# Patient Record
Sex: Female | Born: 1970
Health system: Southern US, Community
[De-identification: ages and names within clinical notes are randomized; demographics above are authoritative.]

## PROBLEM LIST (undated history)

## (undated) DIAGNOSIS — N183 Chronic kidney disease, stage 3 unspecified: Secondary | ICD-10-CM

## (undated) DIAGNOSIS — D509 Iron deficiency anemia, unspecified: Secondary | ICD-10-CM

## (undated) DIAGNOSIS — R0600 Dyspnea, unspecified: Secondary | ICD-10-CM

## (undated) DIAGNOSIS — I3139 Other pericardial effusion (noninflammatory): Secondary | ICD-10-CM

## (undated) DIAGNOSIS — J3089 Other allergic rhinitis: Secondary | ICD-10-CM

## (undated) DIAGNOSIS — I5030 Unspecified diastolic (congestive) heart failure: Secondary | ICD-10-CM

## (undated) DIAGNOSIS — R053 Chronic cough: Secondary | ICD-10-CM

## (undated) DIAGNOSIS — I313 Pericardial effusion (noninflammatory): Secondary | ICD-10-CM

## (undated) DIAGNOSIS — E1165 Type 2 diabetes mellitus with hyperglycemia: Secondary | ICD-10-CM

## (undated) DIAGNOSIS — E119 Type 2 diabetes mellitus without complications: Secondary | ICD-10-CM

## (undated) DIAGNOSIS — I499 Cardiac arrhythmia, unspecified: Secondary | ICD-10-CM

## (undated) DIAGNOSIS — J9611 Chronic respiratory failure with hypoxia: Secondary | ICD-10-CM

## (undated) DIAGNOSIS — J40 Bronchitis, not specified as acute or chronic: Secondary | ICD-10-CM

## (undated) DIAGNOSIS — Z9981 Dependence on supplemental oxygen: Secondary | ICD-10-CM

## (undated) DIAGNOSIS — J189 Pneumonia, unspecified organism: Secondary | ICD-10-CM

## (undated) DIAGNOSIS — F32A Depression, unspecified: Secondary | ICD-10-CM

## (undated) DIAGNOSIS — I509 Heart failure, unspecified: Secondary | ICD-10-CM

## (undated) DIAGNOSIS — I10 Essential (primary) hypertension: Secondary | ICD-10-CM

## (undated) DIAGNOSIS — R0601 Orthopnea: Secondary | ICD-10-CM

## (undated) DIAGNOSIS — R05 Cough: Secondary | ICD-10-CM

## (undated) DIAGNOSIS — M7989 Other specified soft tissue disorders: Secondary | ICD-10-CM

## (undated) DIAGNOSIS — E662 Morbid (severe) obesity with alveolar hypoventilation: Secondary | ICD-10-CM

## (undated) DIAGNOSIS — K5790 Diverticulosis of intestine, part unspecified, without perforation or abscess without bleeding: Secondary | ICD-10-CM

## (undated) DIAGNOSIS — J45909 Unspecified asthma, uncomplicated: Secondary | ICD-10-CM

## (undated) DIAGNOSIS — F419 Anxiety disorder, unspecified: Secondary | ICD-10-CM

## (undated) DIAGNOSIS — F329 Major depressive disorder, single episode, unspecified: Secondary | ICD-10-CM

## (undated) DIAGNOSIS — N19 Unspecified kidney failure: Secondary | ICD-10-CM

## (undated) DIAGNOSIS — J449 Chronic obstructive pulmonary disease, unspecified: Secondary | ICD-10-CM

## (undated) HISTORY — DX: Chronic respiratory failure with hypoxia: J96.11

## (undated) HISTORY — DX: Morbid (severe) obesity with alveolar hypoventilation: E66.2

## (undated) SURGERY — Surgical Case
Anesthesia: *Unknown

---

## 1998-07-14 ENCOUNTER — Ambulatory Visit: Admission: RE | Admit: 1998-07-14 | Discharge: 1998-07-14 | Payer: Self-pay | Admitting: Pulmonary Disease

## 2004-10-29 ENCOUNTER — Emergency Department: Payer: Self-pay | Admitting: Emergency Medicine

## 2004-10-29 IMAGING — CR DG ELBOW COMPLETE 3+V*L*
1 series · 4 of 4 positions shown · non-contrast
Comparison: none

REASON FOR EXAM: trauma
COMMENTS:  LMP: [DATE]

PROCEDURE:     DXR - DXR ELBOW LT COMP W/OBLIQUES  - [DATE]  [DATE]
RESULT:     Four views of the LEFT elbow show no fracture, dislocation or
other acute bony abnormality.

[Series 1: view not recorded · 0.17mm/px · 4 of 4 slices shown]
[im 1/4]
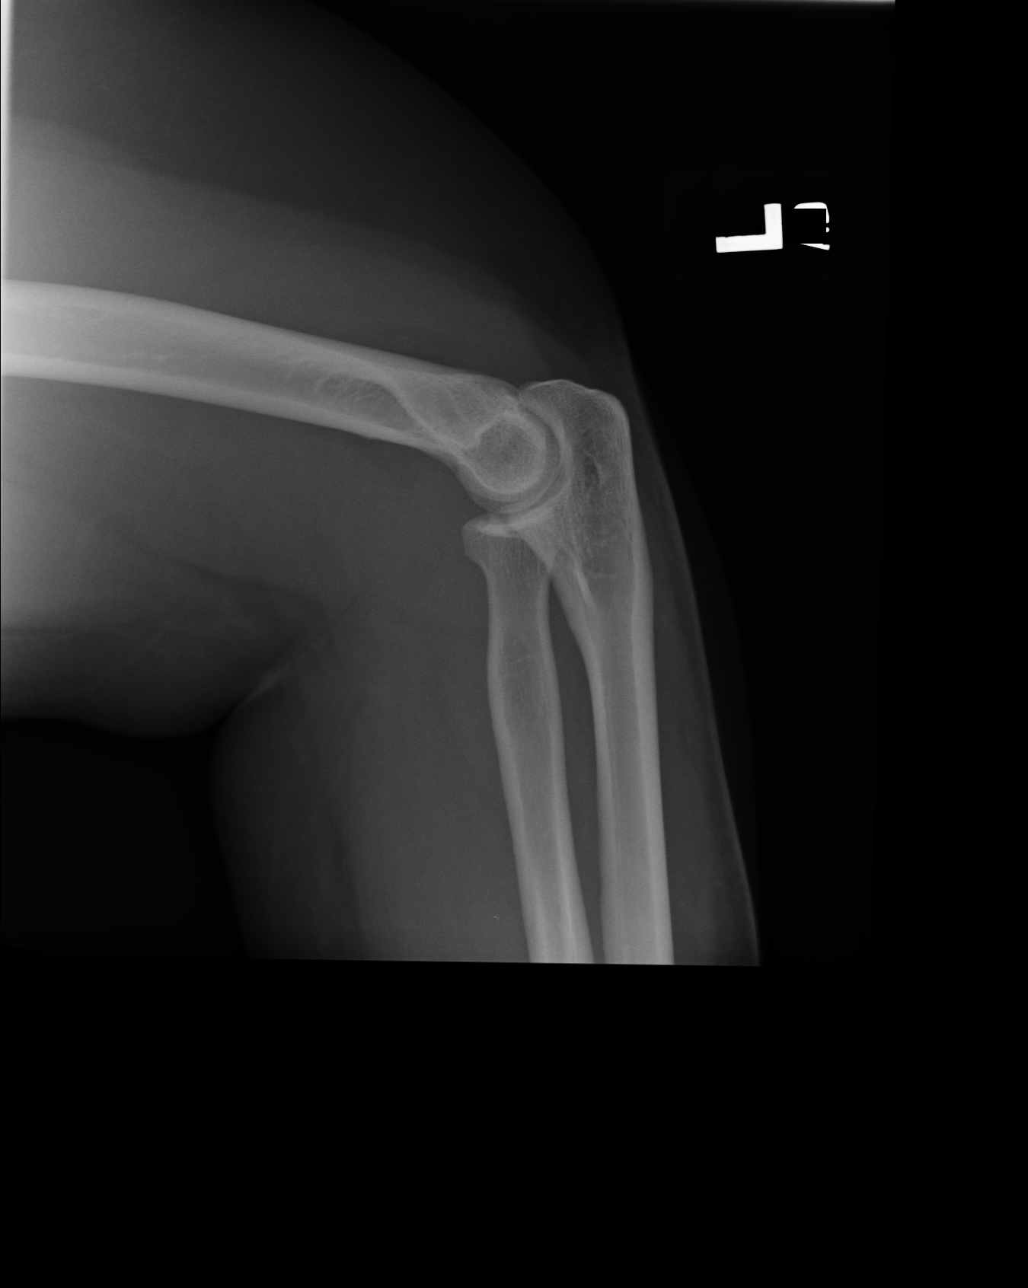
[im 2/4]
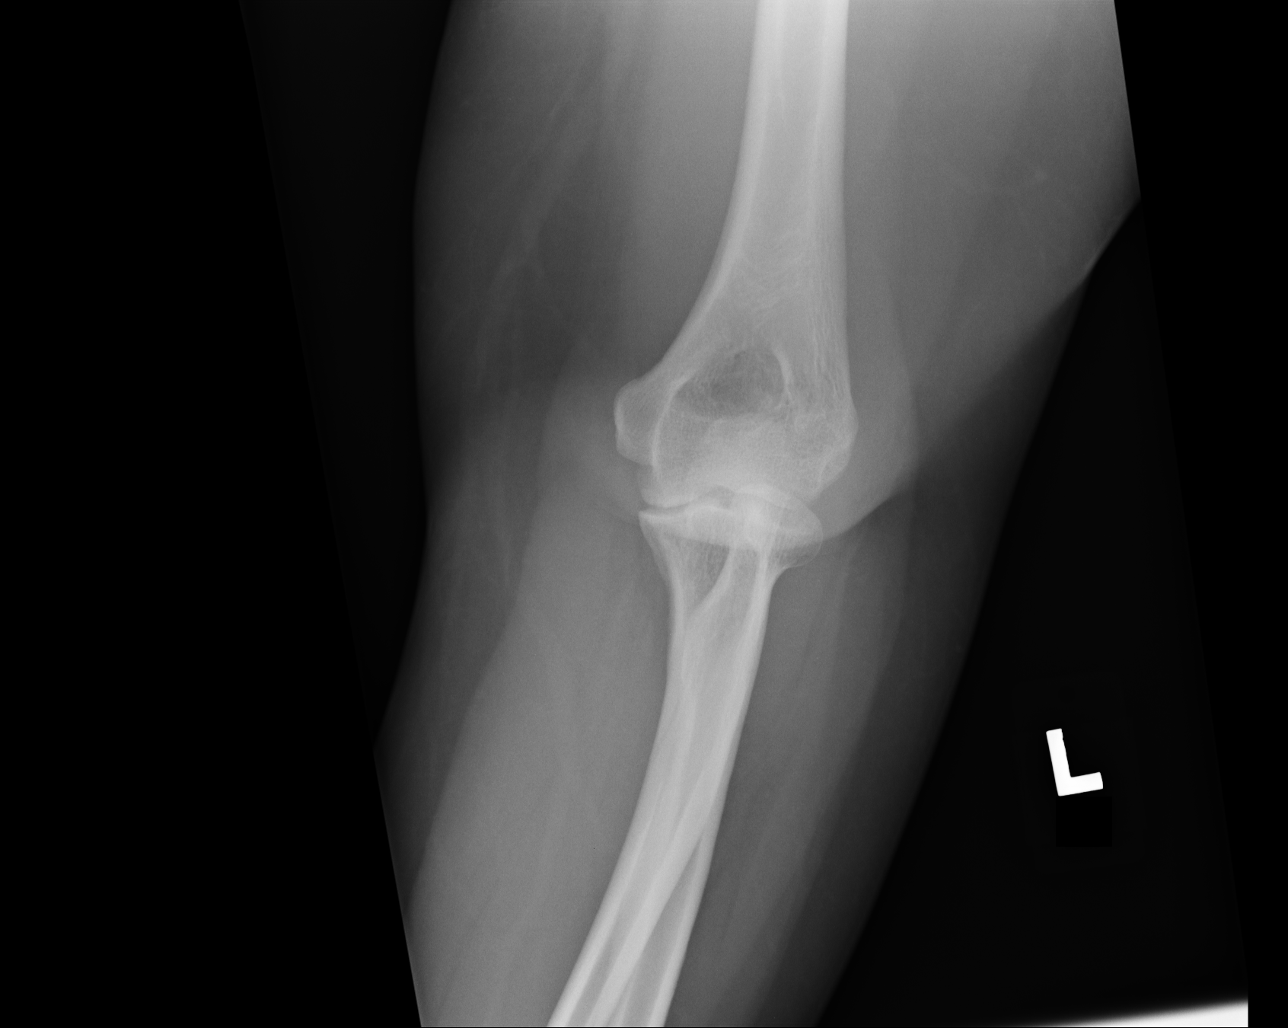
[im 3/4]
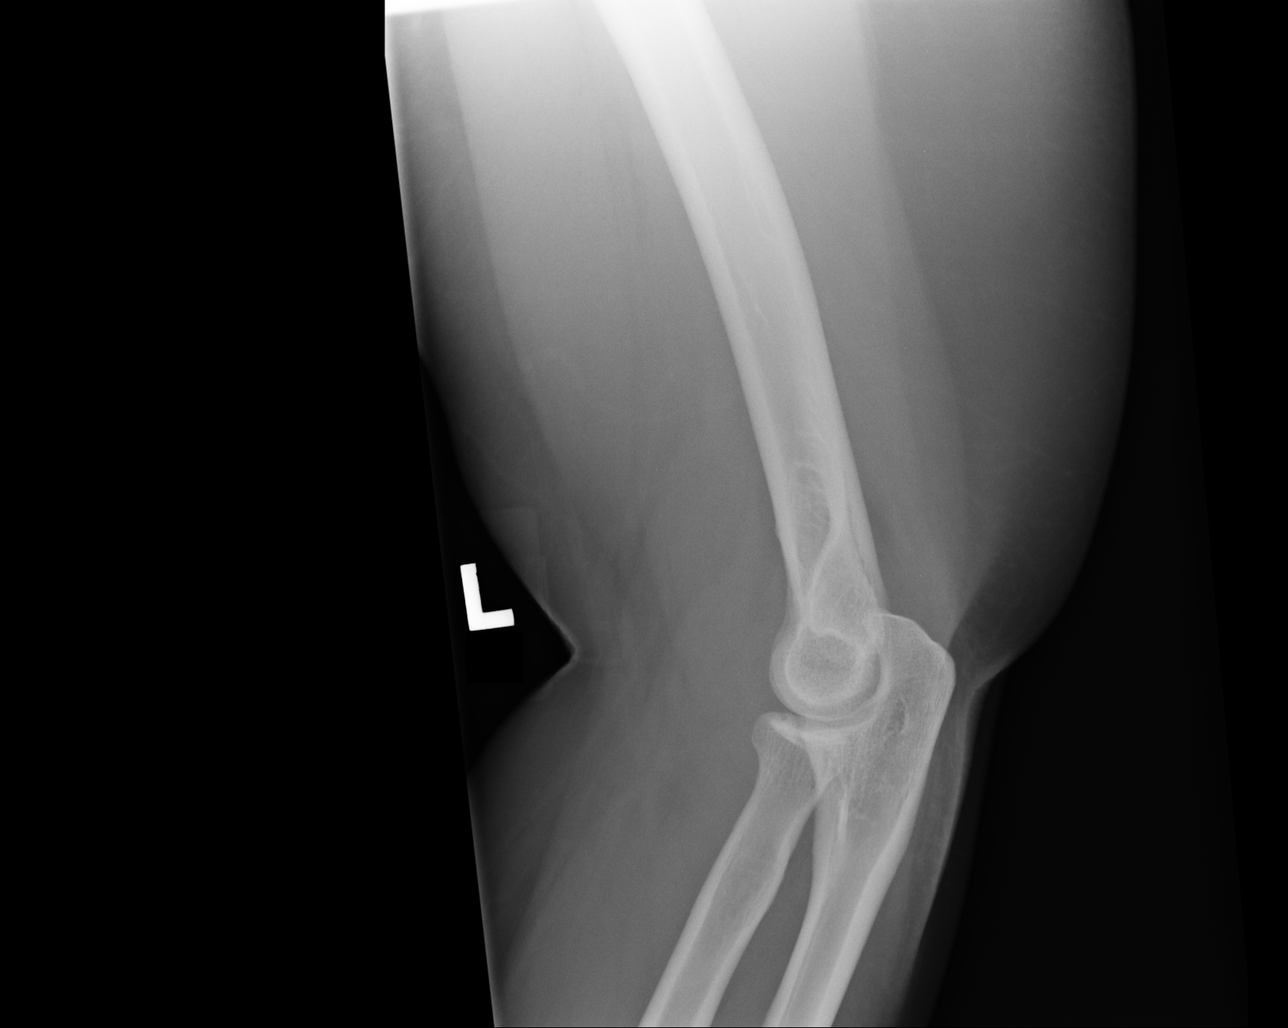
[im 4/4]
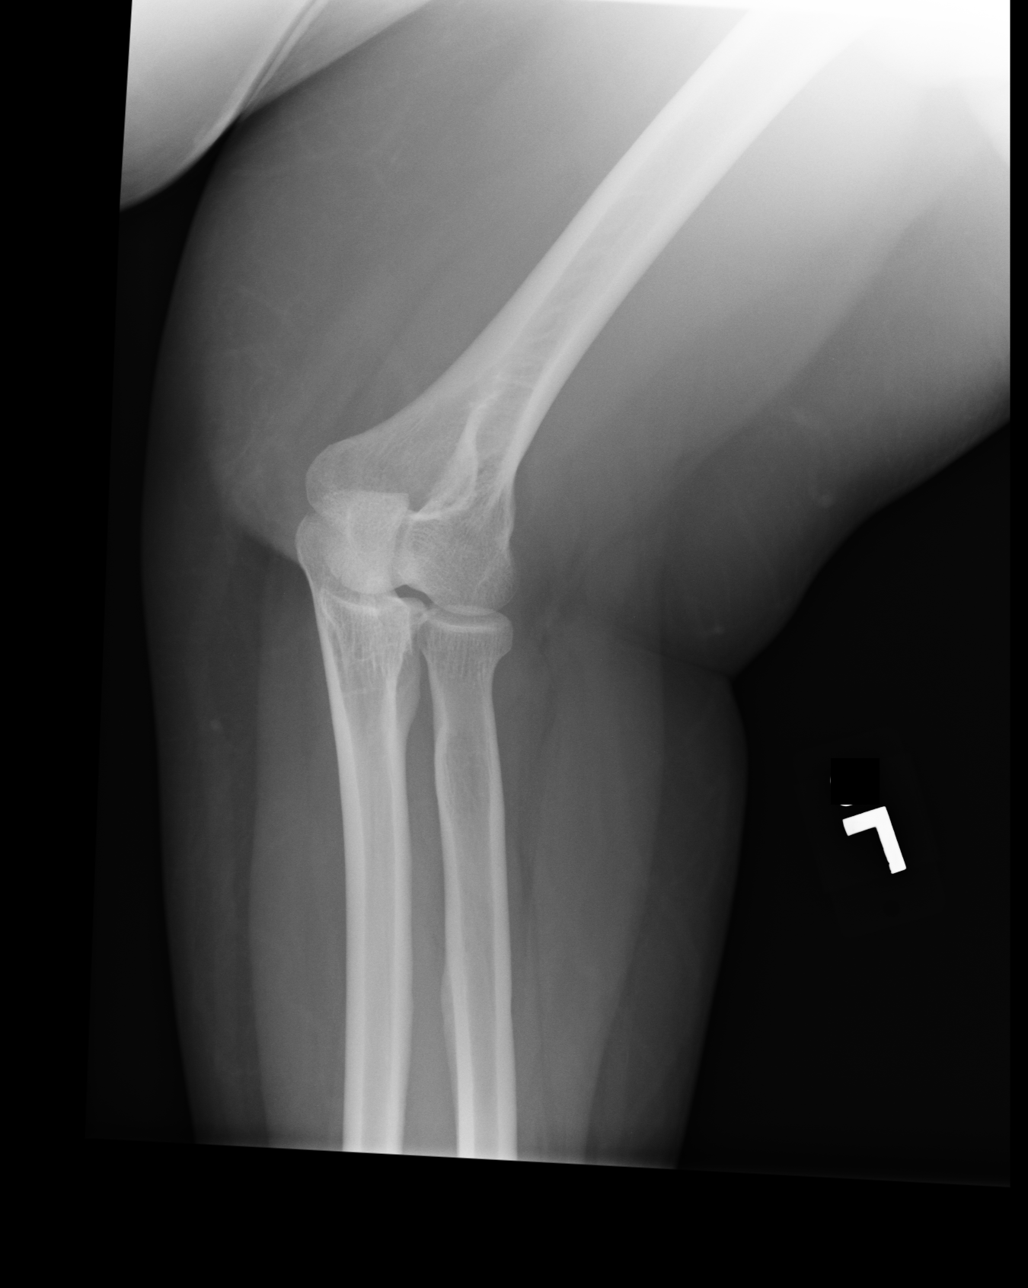

[4 of 4 positions shown; findings below may reference images not displayed]

IMPRESSION: 1)No acute bony abnormalities are identified.

## 2005-04-08 ENCOUNTER — Emergency Department: Payer: Self-pay | Admitting: General Practice

## 2005-06-24 IMAGING — CR DG HUMERUS 2V *L*
1 series · 2 of 2 positions shown · non-contrast
Comparison: none

REASON FOR EXAM: fall
COMMENTS:

PROCEDURE:     DXR - DXR HUMERUS LEFT  - [DATE]  [DATE]
RESULT:     No fracture, dislocation or other acute bony abnormalities are
noted.

[Series 1: view not recorded · 0.17mm/px · 2 of 2 slices shown]
[im 1/2]
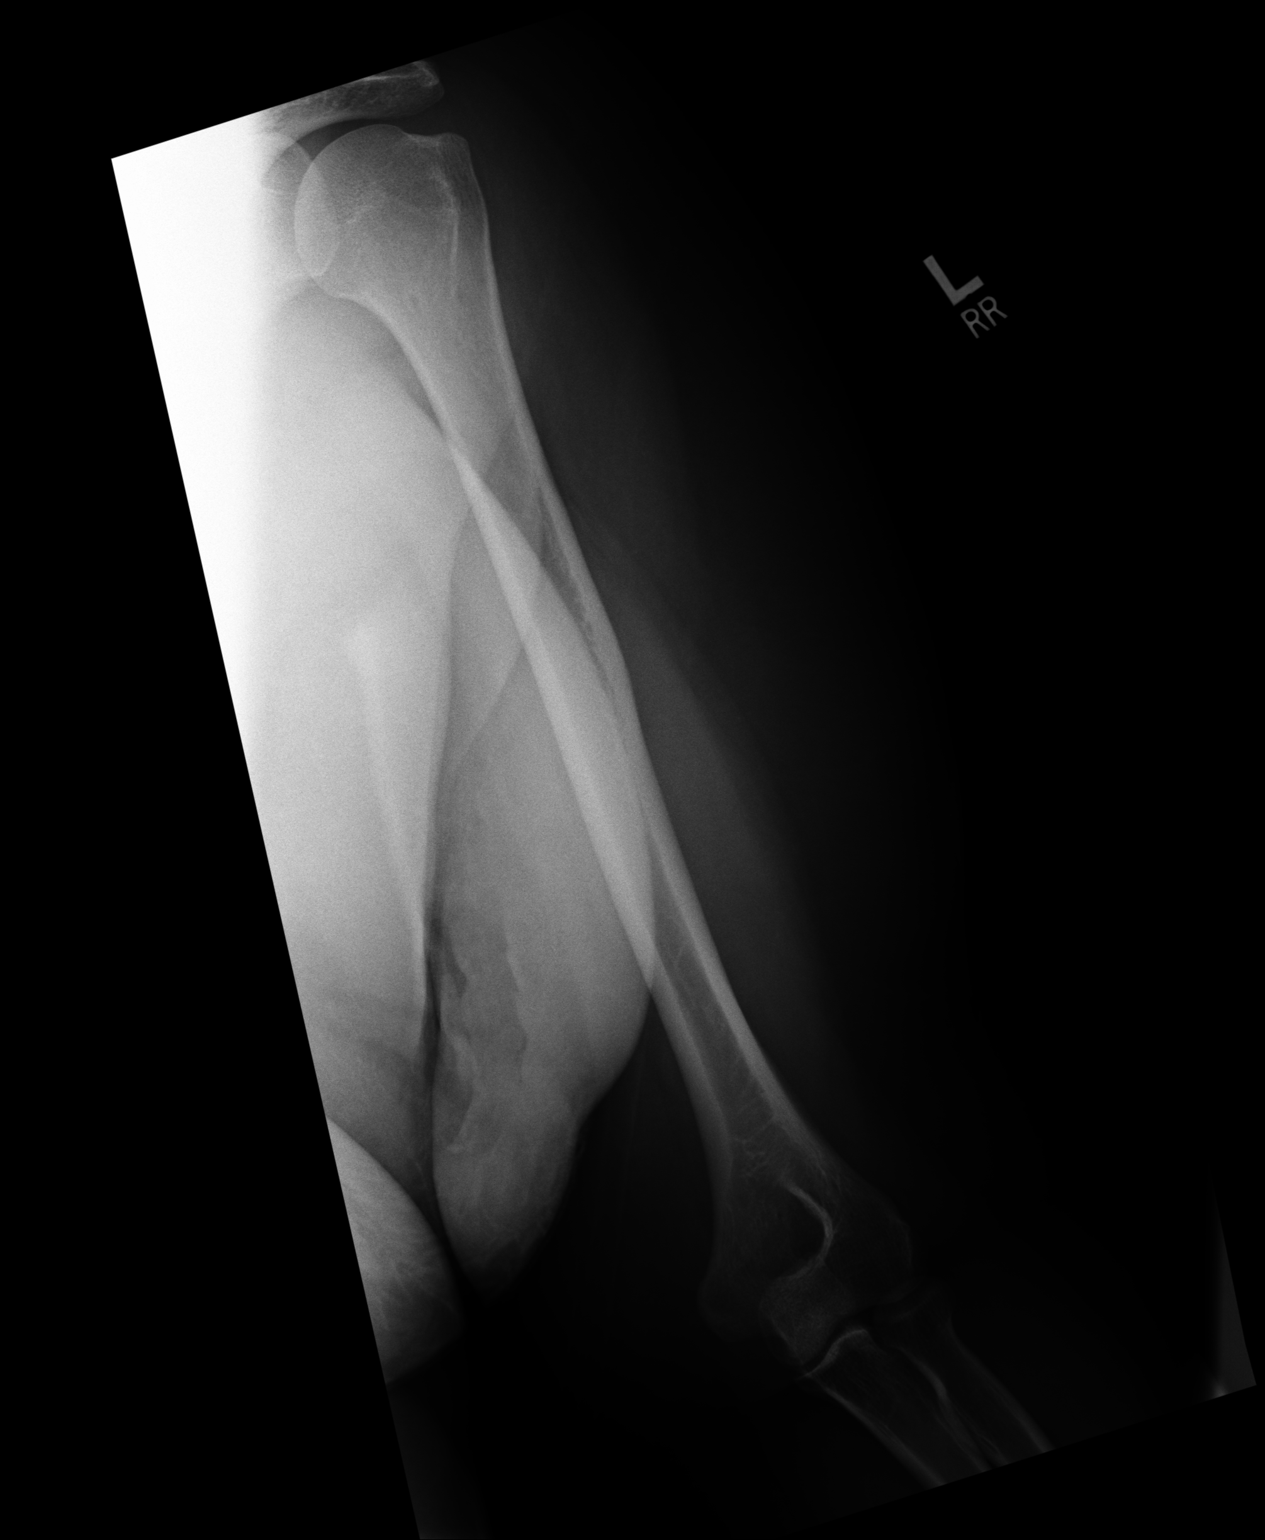
[im 2/2]
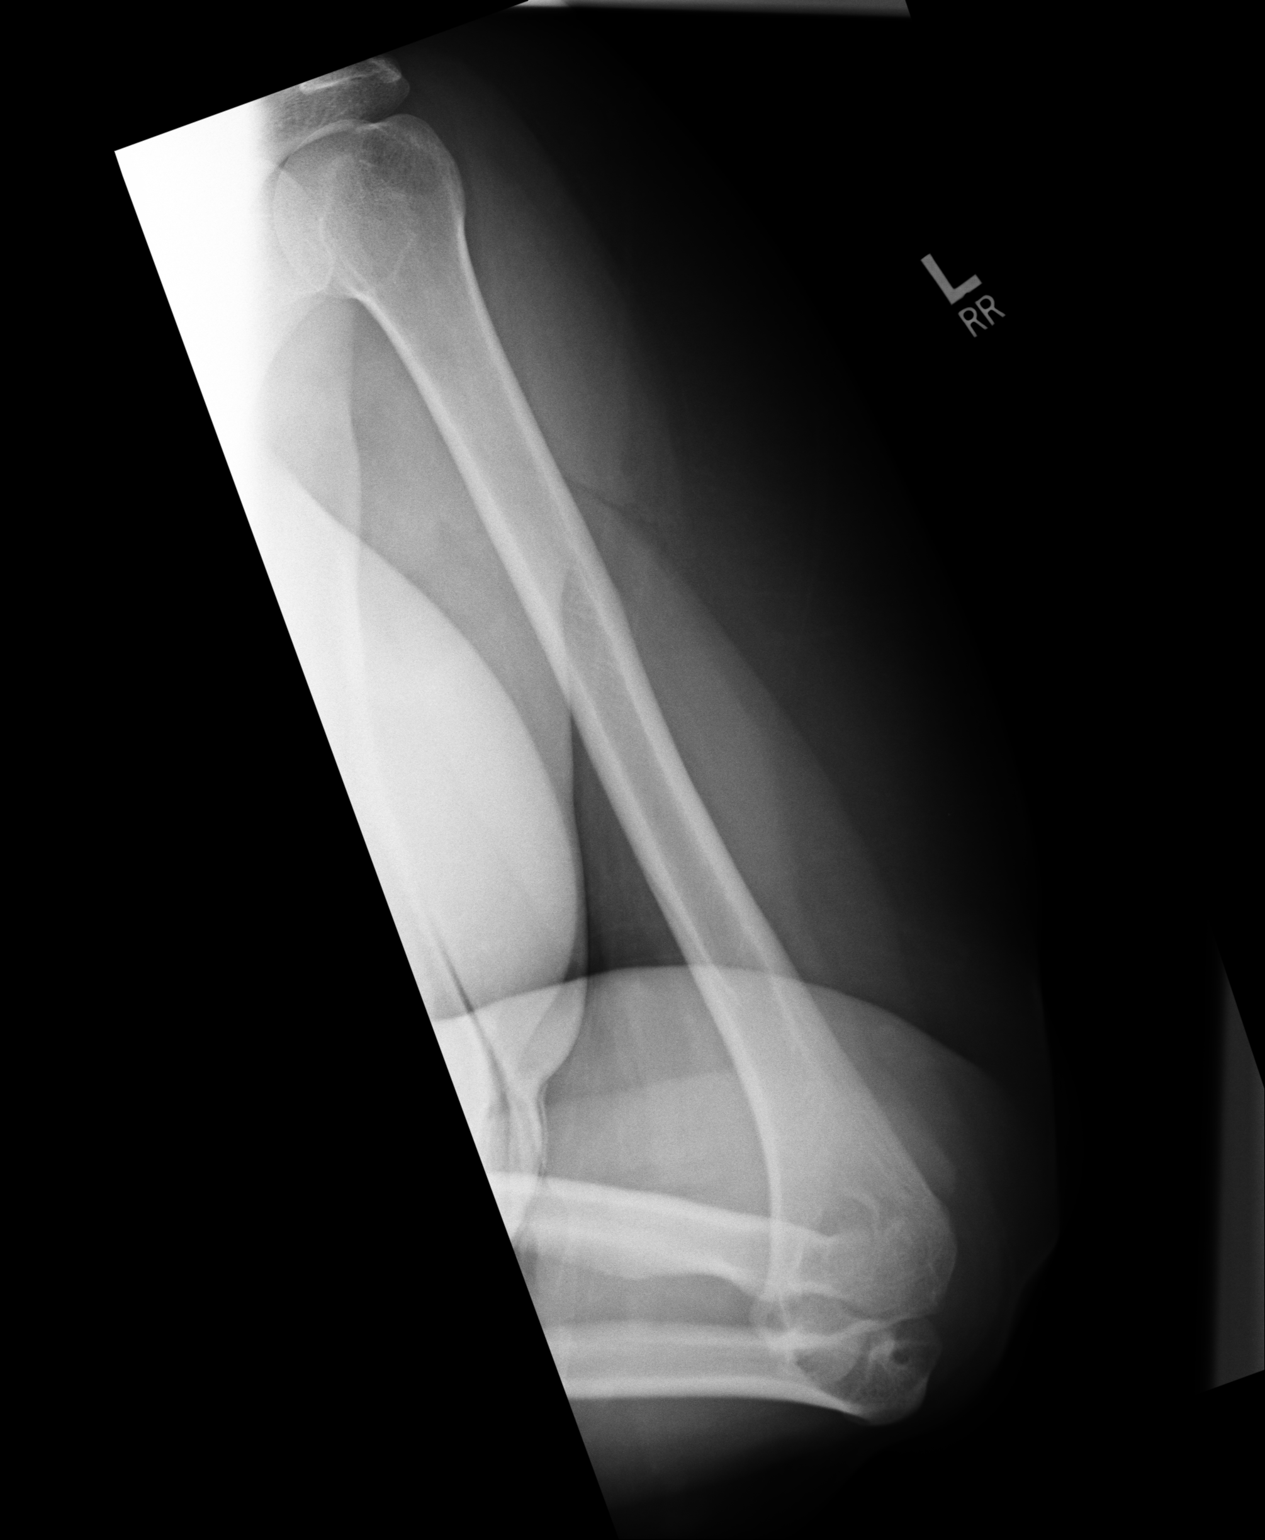

[2 of 2 positions shown; findings below may reference images not displayed]

IMPRESSION: 1)No acute bony abnormalities are identified.

## 2005-07-03 ENCOUNTER — Ambulatory Visit: Payer: Self-pay | Admitting: Gynecology

## 2005-07-12 ENCOUNTER — Inpatient Hospital Stay (HOSPITAL_COMMUNITY): Admission: AD | Admit: 2005-07-12 | Discharge: 2005-07-12 | Payer: Self-pay | Admitting: *Deleted

## 2005-07-18 ENCOUNTER — Ambulatory Visit: Payer: Self-pay | Admitting: Obstetrics & Gynecology

## 2005-07-25 ENCOUNTER — Ambulatory Visit: Payer: Self-pay | Admitting: Obstetrics & Gynecology

## 2005-09-05 ENCOUNTER — Ambulatory Visit: Payer: Self-pay | Admitting: Obstetrics & Gynecology

## 2005-09-12 ENCOUNTER — Ambulatory Visit: Payer: Self-pay | Admitting: Obstetrics & Gynecology

## 2005-09-25 ENCOUNTER — Ambulatory Visit: Payer: Self-pay | Admitting: Gynecology

## 2005-09-25 ENCOUNTER — Ambulatory Visit (HOSPITAL_COMMUNITY): Admission: RE | Admit: 2005-09-25 | Discharge: 2005-09-25 | Payer: Self-pay | Admitting: Gynecology

## 2005-11-07 ENCOUNTER — Ambulatory Visit: Payer: Self-pay | Admitting: Obstetrics & Gynecology

## 2007-05-19 ENCOUNTER — Emergency Department: Payer: Self-pay | Admitting: Emergency Medicine

## 2007-06-10 ENCOUNTER — Emergency Department: Payer: Self-pay | Admitting: Emergency Medicine

## 2007-06-10 IMAGING — US US OB < 14 WEEKS - US OB TV
1 series · 17 of 28 positions shown · non-contrast
Comparison: none

REASON FOR EXAM: bleeding
COMMENTS:

[Series 1: us ob < 14 weeks - us ob tv · 17 of 37 slices shown]
[im 1/37]
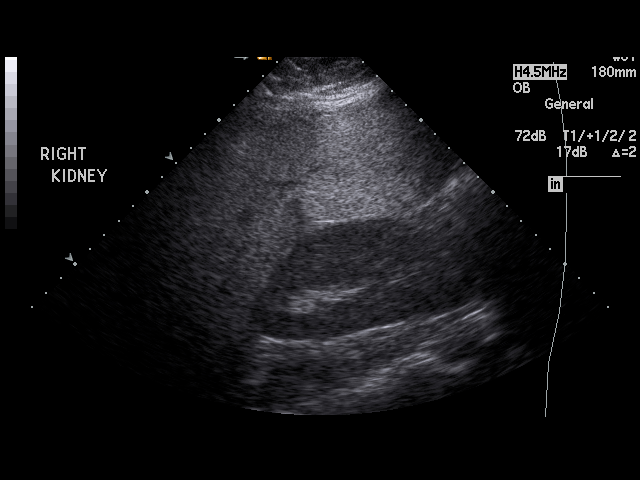
[im 3/37]
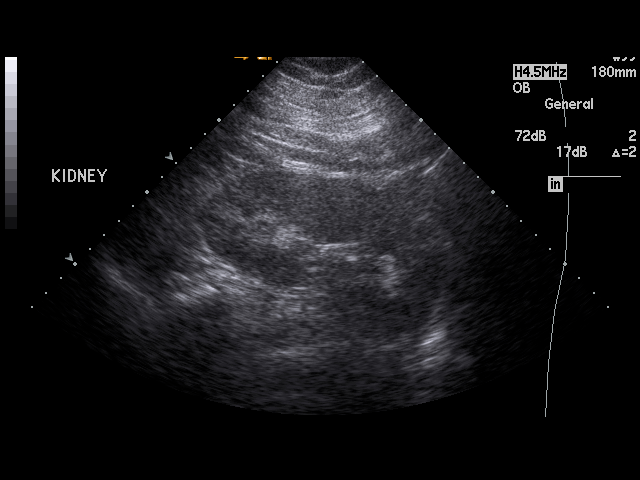
[im 6/37]
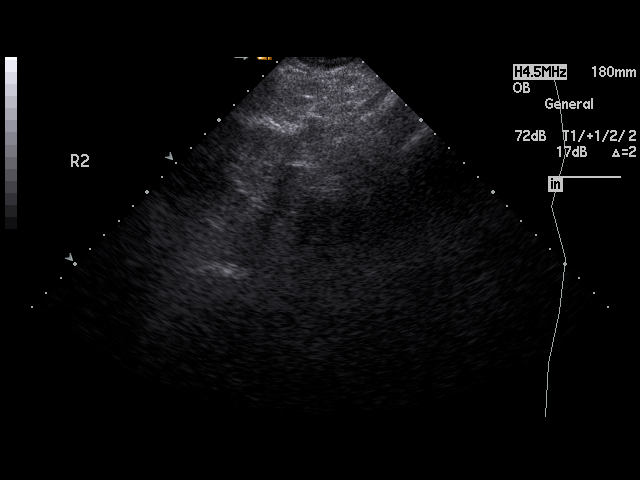
[im 7/37]
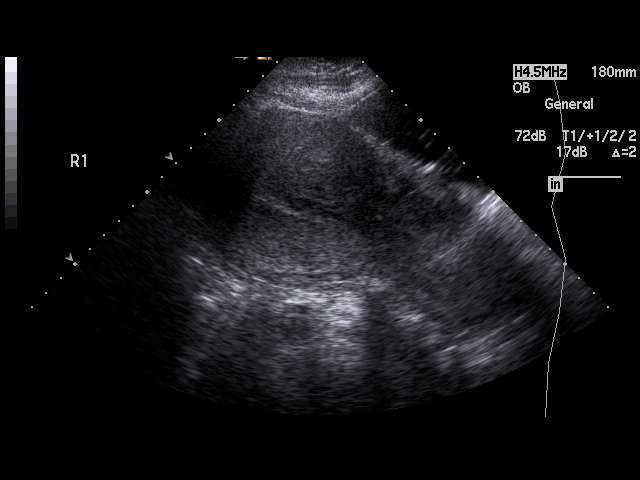
[im 10/37]
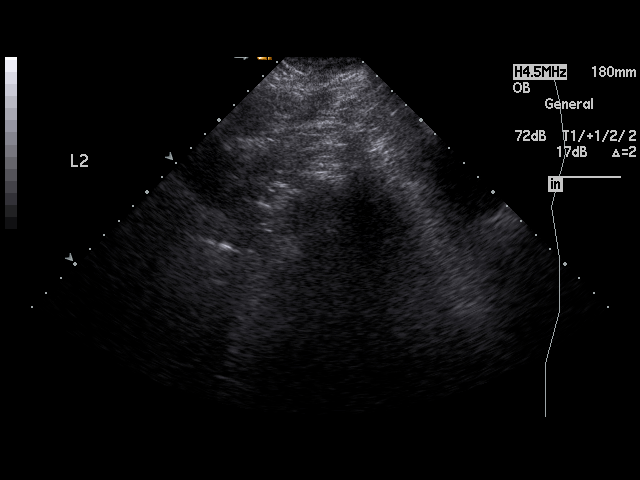
[im 13/37]
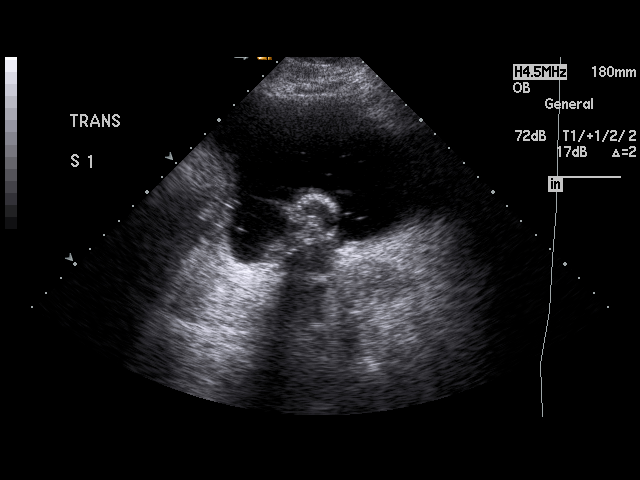
[im 14/37]
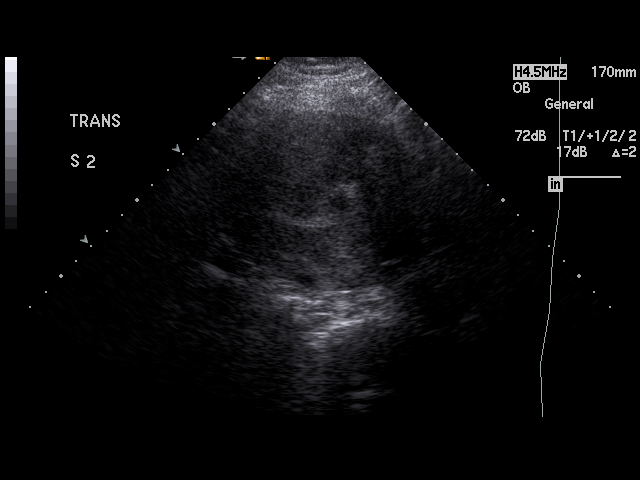
[im 17/37]
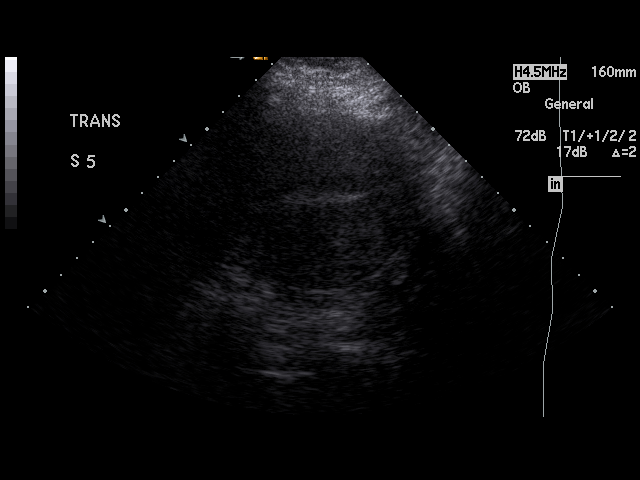
[im 19/37]
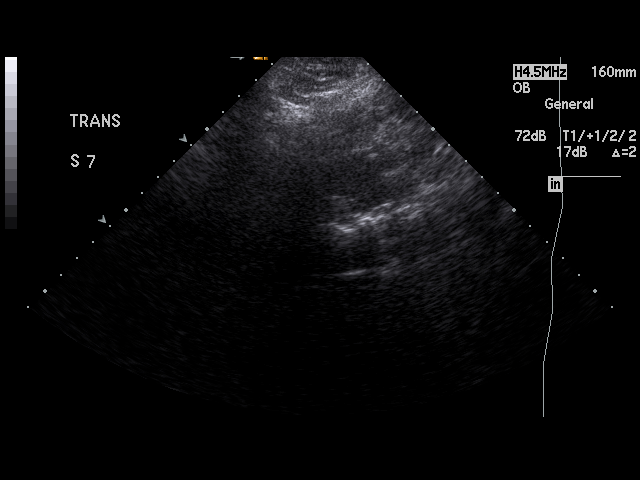
[im 21/37]
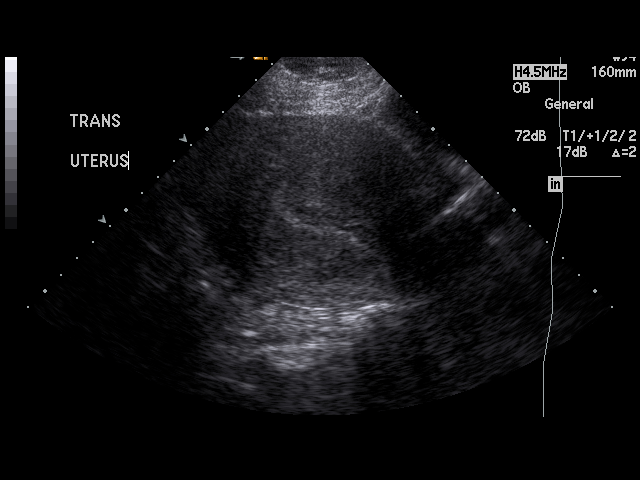
[im 23/37]
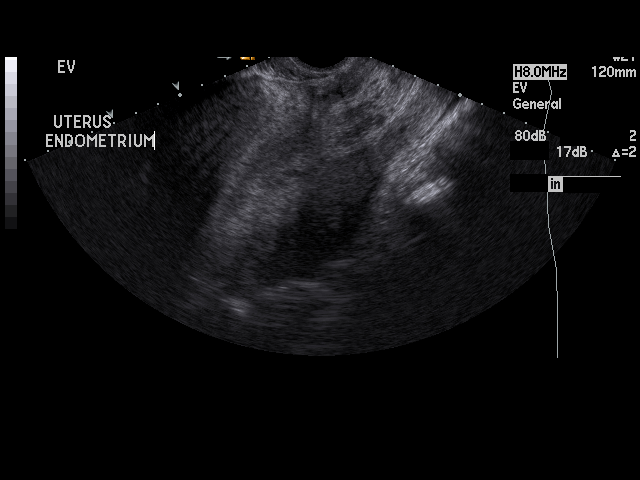
[im 25/37]
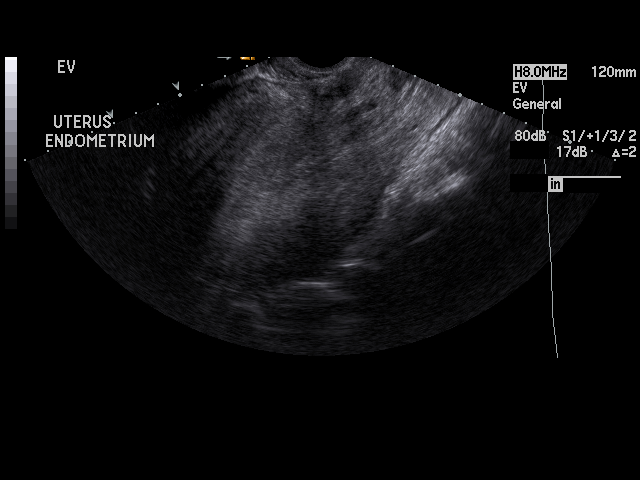
[im 27/37]
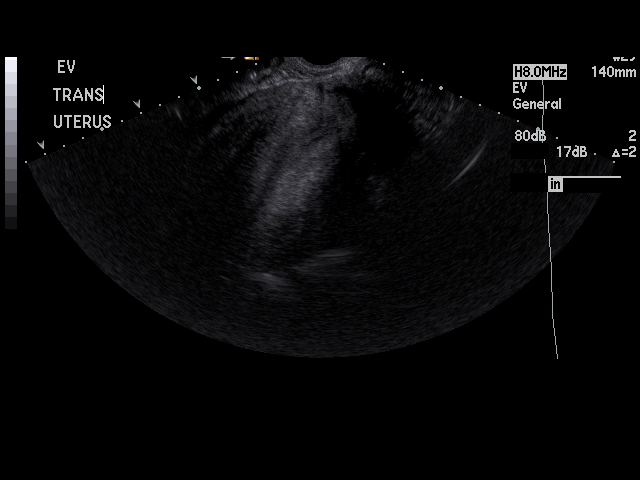
[im 30/37]
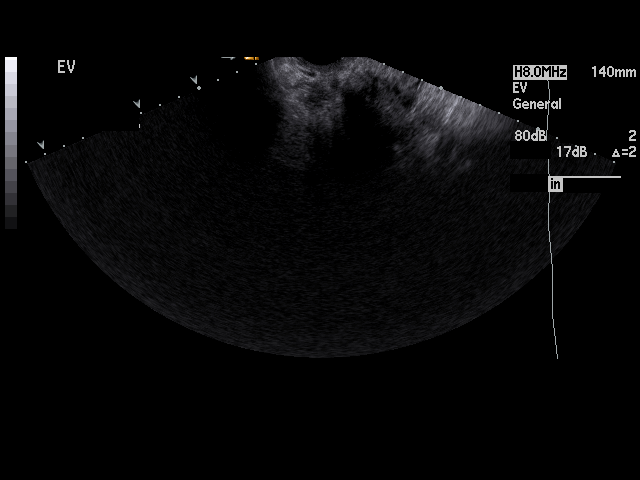
[im 31/37]
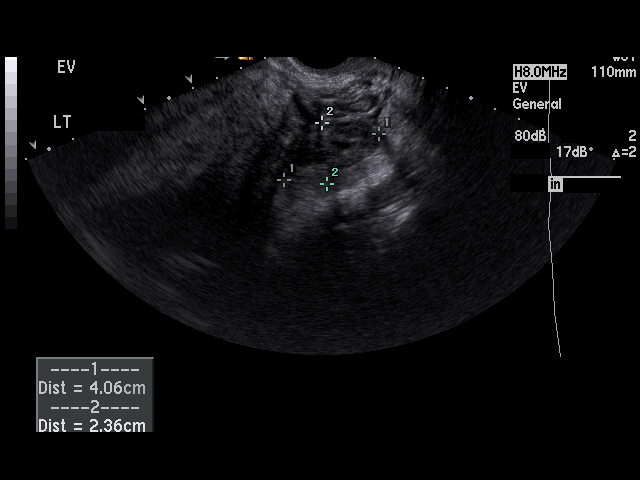
[im 34/37]
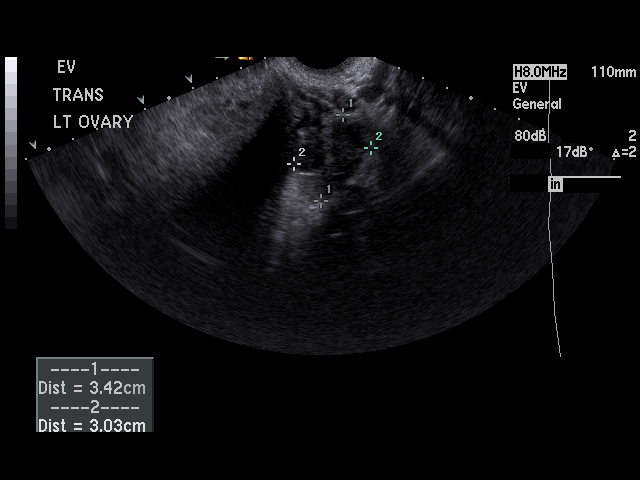
[im 37/37]
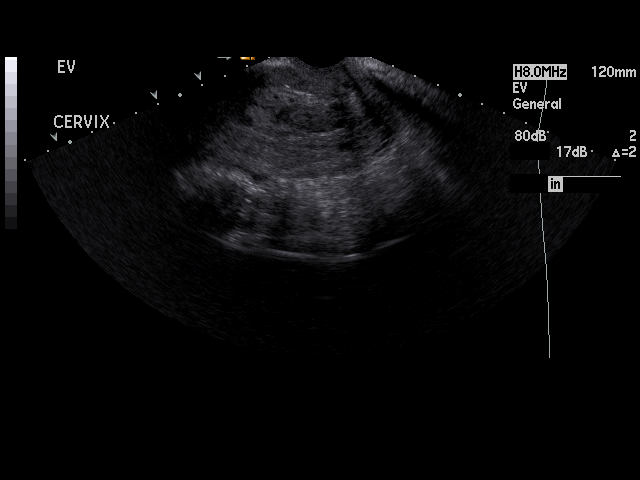

[17 of 28 positions shown; findings below may reference images not displayed]

PROCEDURE:     US  - US OB LESS THAN 14 WEEKS  - [DATE]  [DATE]

RESULT:     Emergent sonogram of the pelvis is performed. The patient had
undergone previous ultrasound of the pelvis in [facility] on [DATE]. The
patient was informed there was an 8 week intrauterine gestation with no
heartbeat evident. The patient has been passing tissue and clots.

The kidneys appear to be grossly normal. The uterus shows a thickened
endometrial stripe of 2.8 cm with evidence of fluid, possibly blood, in the
endometrial cavity and in the cervix. There is no intraperitoneal free
fluid. The LEFT ovary appears unremarkable. The RIGHT ovary could not be
visualized.
IMPRESSION: No intrauterine gestation. There is some fluid or blood as
described. The endometrial stripe is thickened. The possibility of retained
products of conception cannot be completely excluded given the appearance.
Gynecologic follow-up is suggested.

## 2008-05-03 ENCOUNTER — Ambulatory Visit: Payer: Self-pay | Admitting: General Practice

## 2008-12-21 ENCOUNTER — Emergency Department: Payer: Self-pay | Admitting: Emergency Medicine

## 2008-12-21 IMAGING — CR DG CHEST 2V
1 series · 2 of 2 positions shown · non-contrast
Comparison: none

REASON FOR EXAM: cough
COMMENTS:

[Series 1: view not recorded · 0.17mm/px · 2 of 2 slices shown]
[im 1/2]
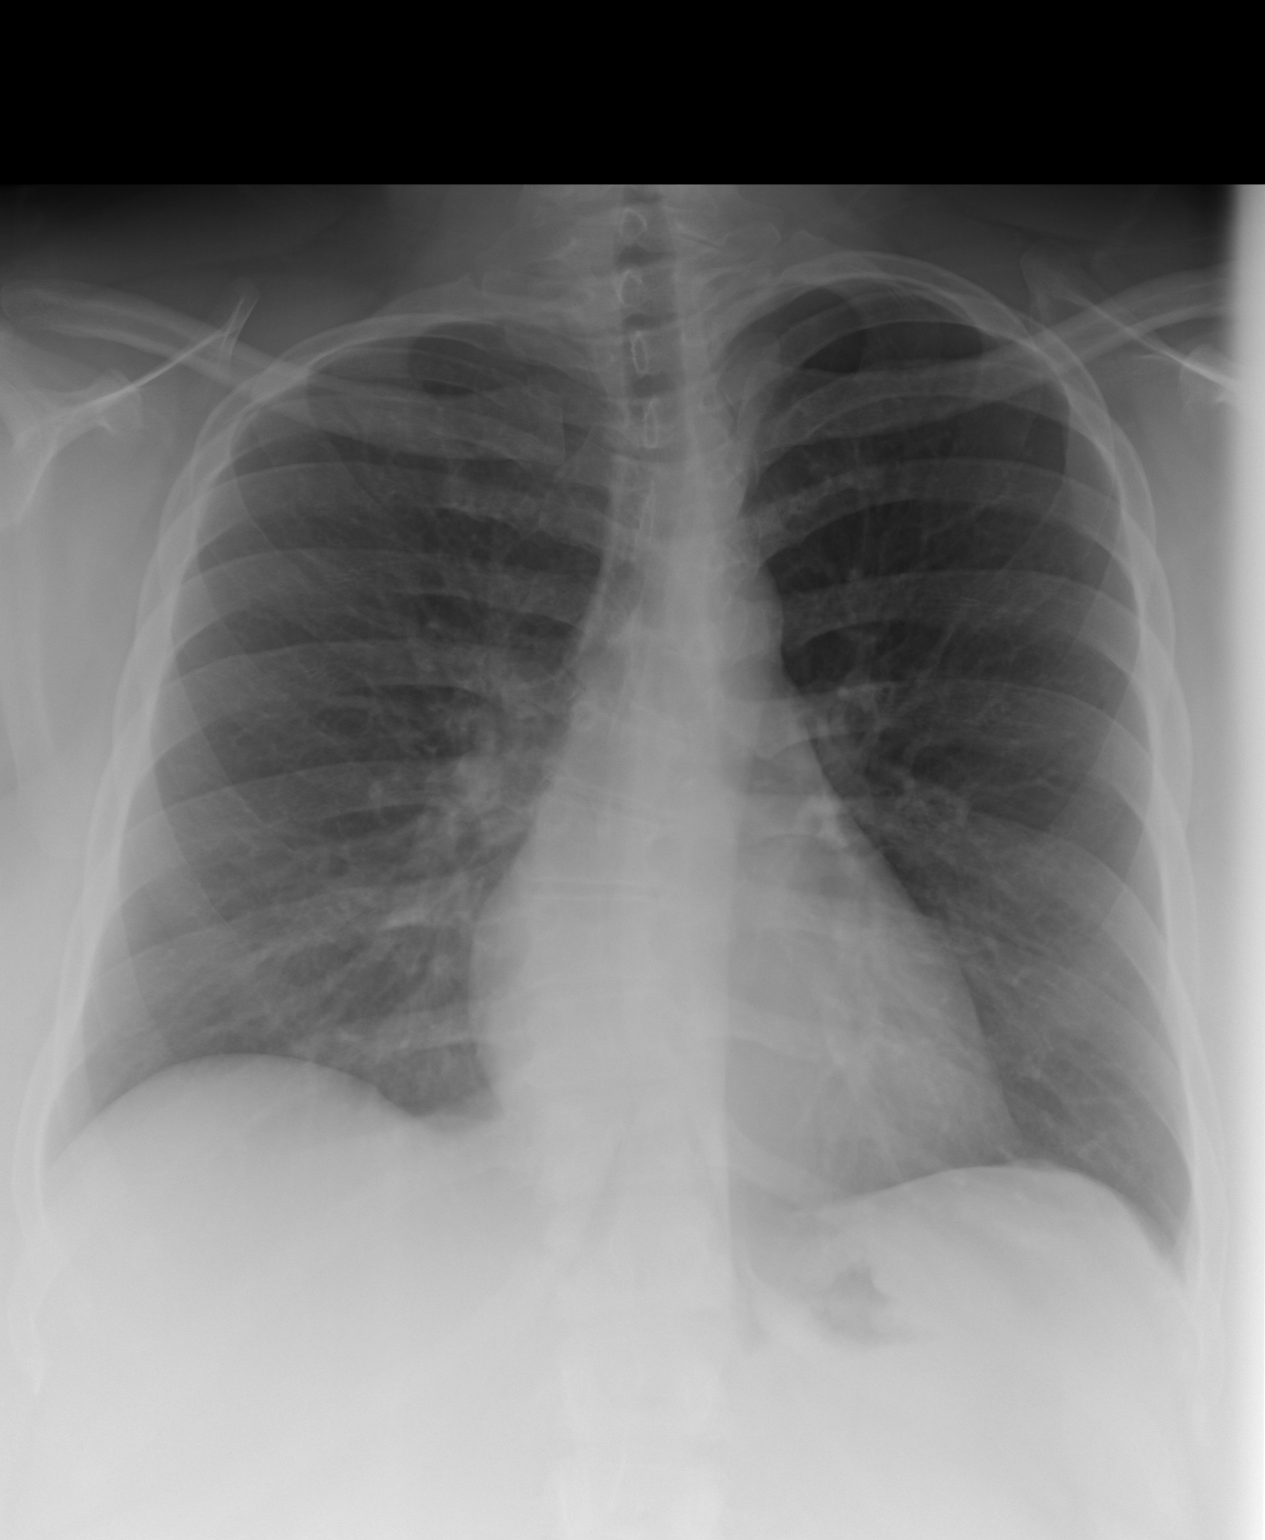
[im 2/2]
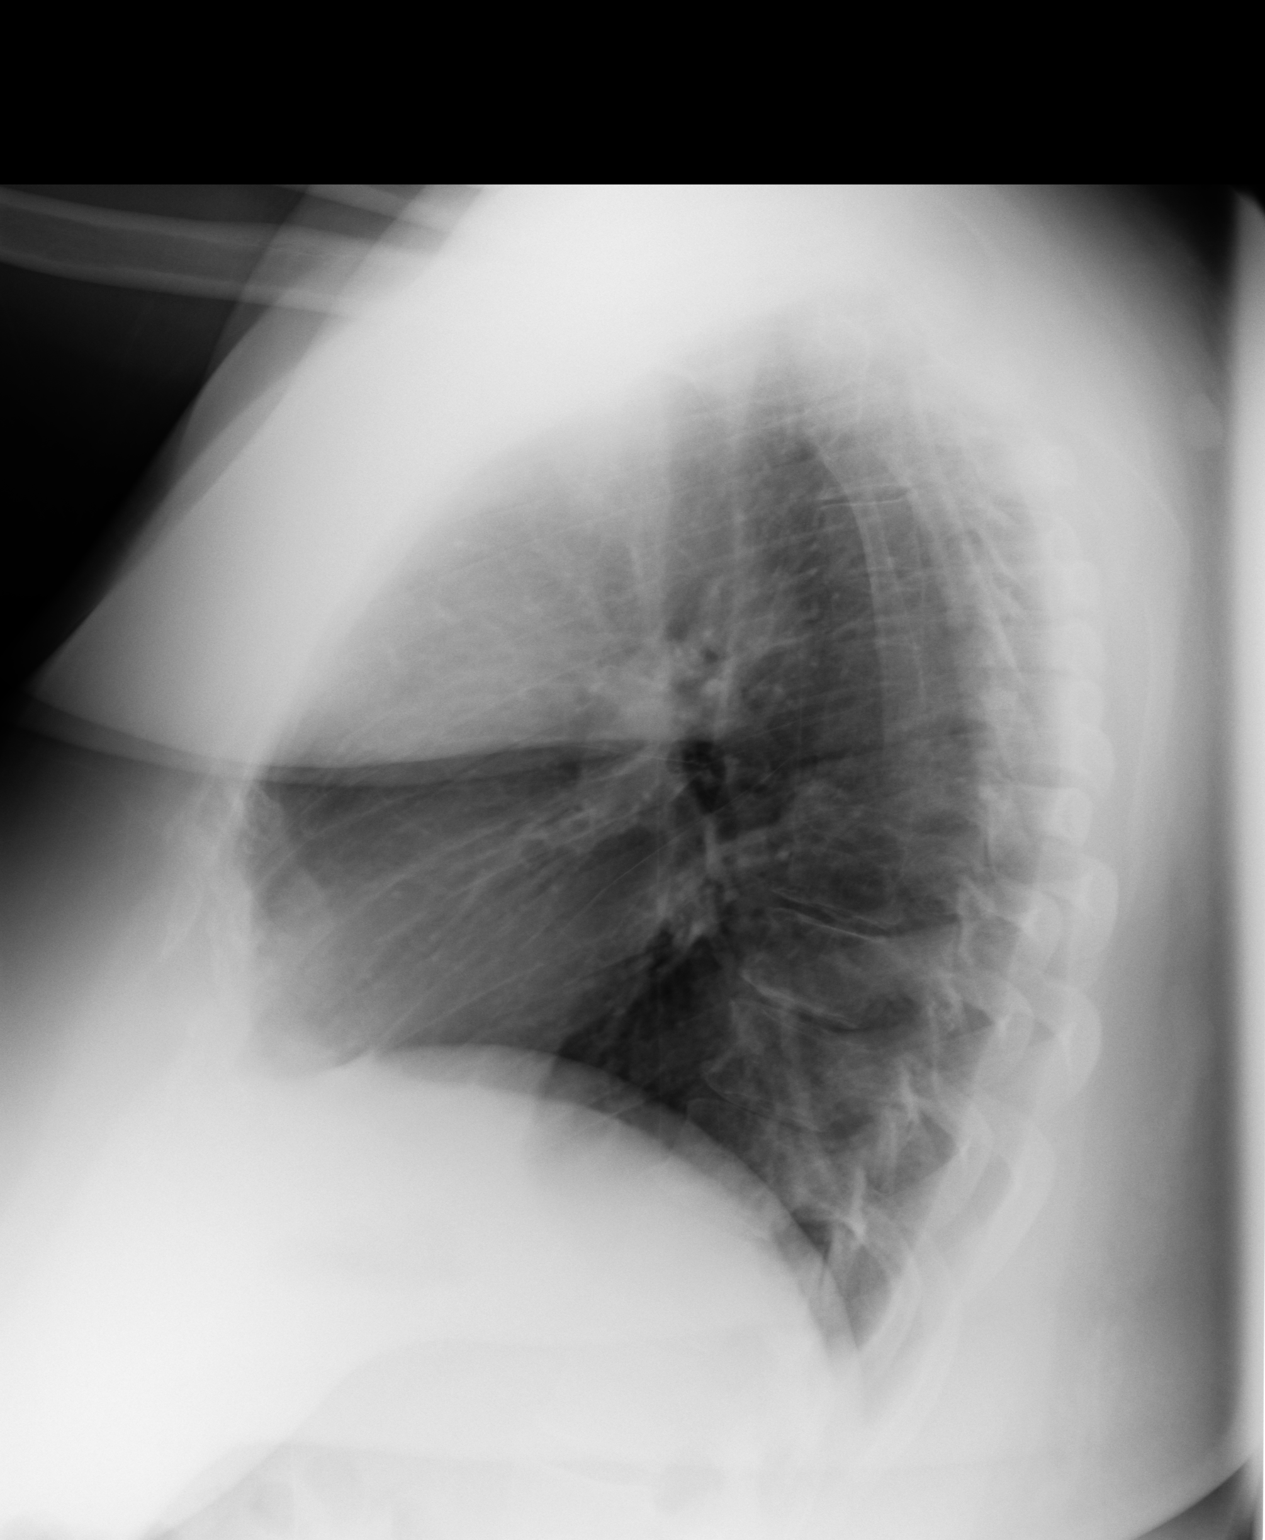

[2 of 2 positions shown; findings below may reference images not displayed]

PROCEDURE:     DXR - DXR CHEST PA (OR AP) AND LATERAL  - [DATE]  [DATE]

RESULT:     The lungs are clear. The heart and pulmonary vessels are normal.
The bony and mediastinal structures are unremarkable. There is no effusion.
There is no pneumothorax or evidence of congestive failure. Incidental note
is made of a mild scoliosis of the inferior thoracic spine concave to the
left.
IMPRESSION: No acute cardiopulmonary disease.

## 2011-02-05 ENCOUNTER — Emergency Department: Payer: Self-pay | Admitting: Emergency Medicine

## 2011-02-05 IMAGING — CR DG CHEST 2V
1 series · 2 of 2 positions shown · non-contrast
Comparison: none

REASON FOR EXAM: cough,  sob , fever
COMMENTS:

PROCEDURE:     DXR - DXR CHEST PA (OR AP) AND LATERAL  - [DATE]  [DATE]
RESULT:     The lungs are clear. The cardiac silhouette and visualized bony
skeleton are unremarkable. The patient is taken a shallow inspiration, which
accentuates the interstitial findings.

[Series 1: w chest pa · 0.14mm/px · 2 of 2 slices shown]
[im 1/2]
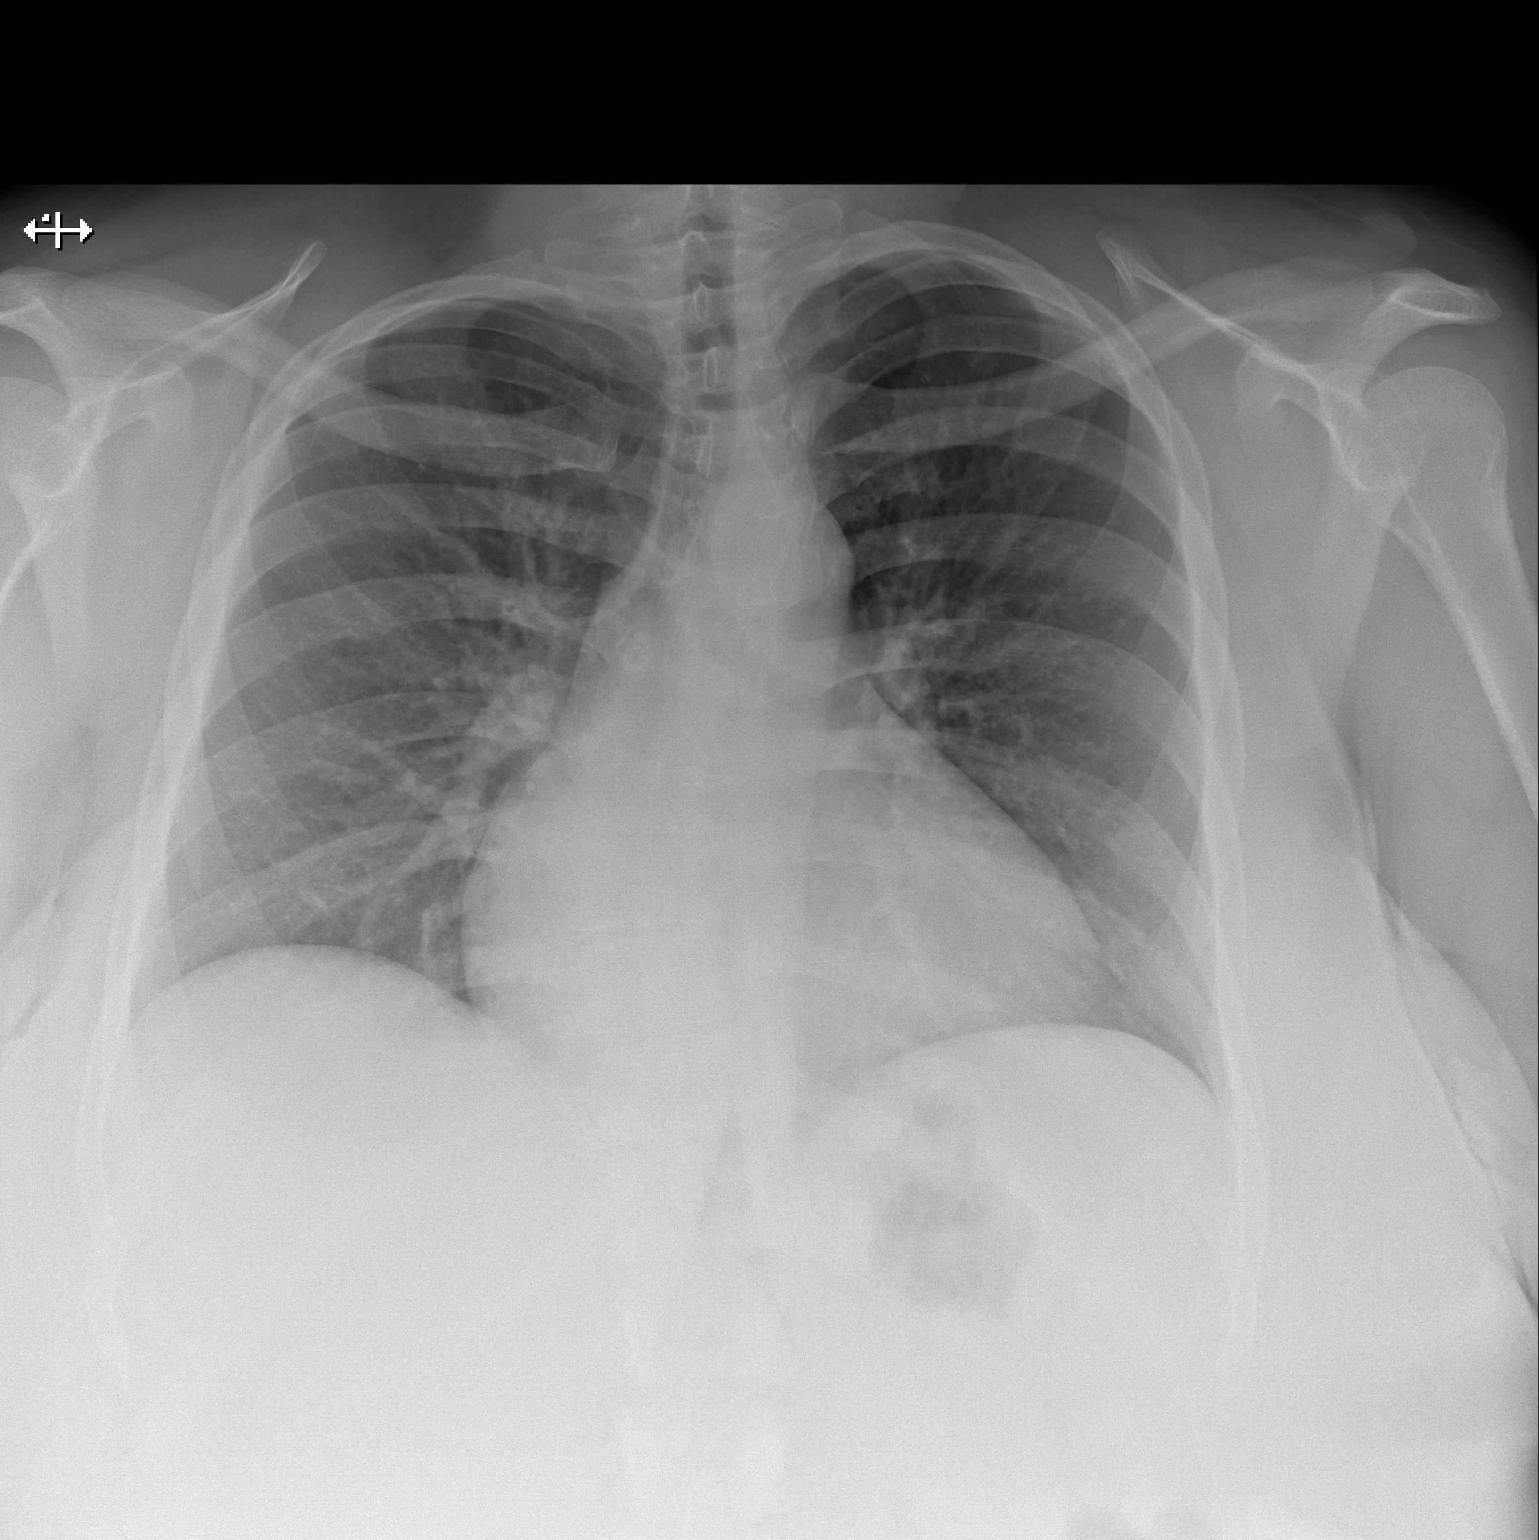
[im 2/2]
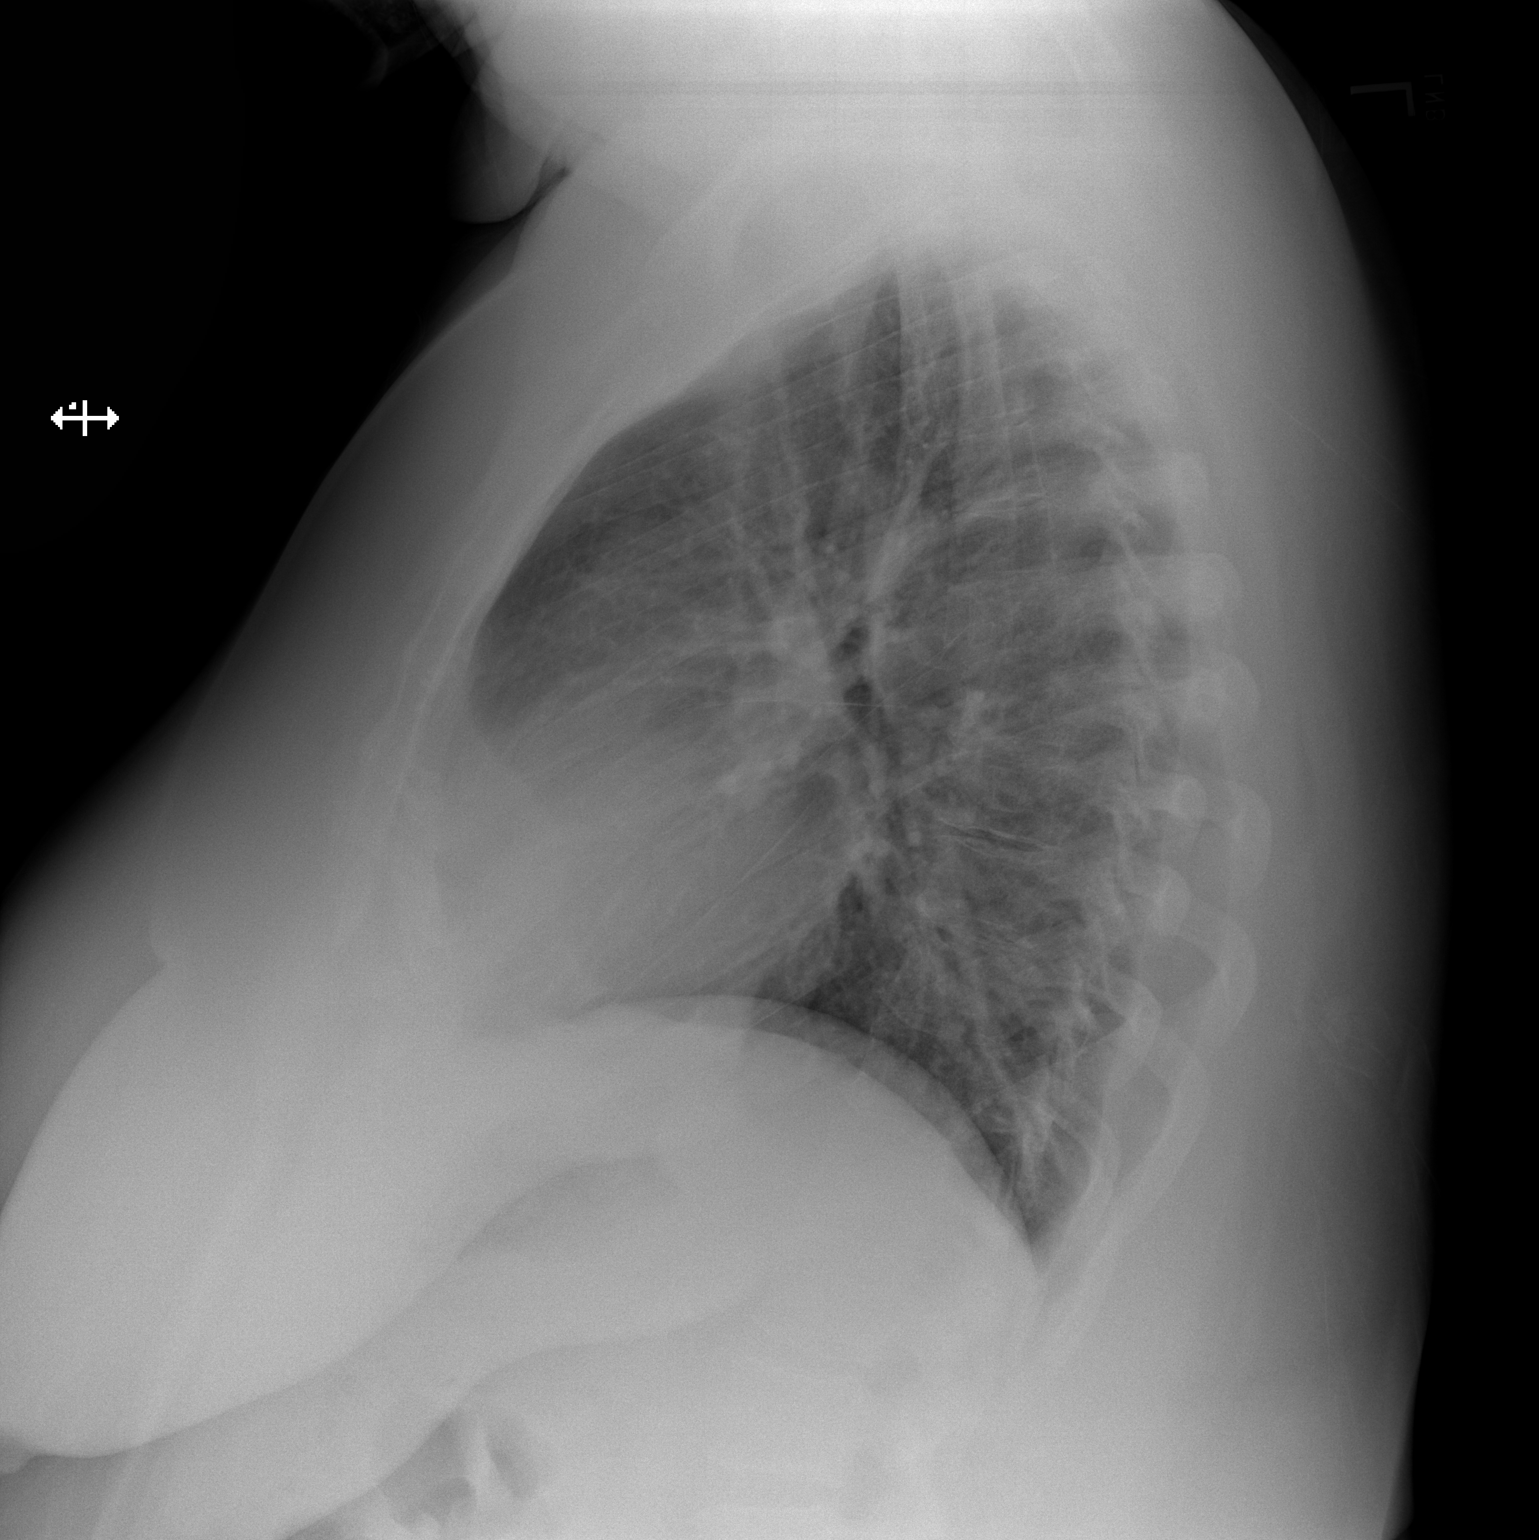

[2 of 2 positions shown; findings below may reference images not displayed]

IMPRESSION: 1. Chest radiograph without evidence of acute cardiopulmonary disease.
2. A comparison is made to prior study dated [DATE].

## 2011-05-21 ENCOUNTER — Ambulatory Visit: Payer: Self-pay | Admitting: Physician Assistant

## 2011-05-27 ENCOUNTER — Ambulatory Visit: Payer: Self-pay | Admitting: Physician Assistant

## 2012-01-03 ENCOUNTER — Ambulatory Visit: Payer: Self-pay | Admitting: Internal Medicine

## 2012-01-03 IMAGING — MG MM CAD SCREENING MAMMO
1 series · 5 of 5 positions shown · non-contrast
Comparison: none

REASON FOR EXAM: SCR MAMMO BASELINE
COMMENTS:

[R CC · right · 5 of 5 slices shown]
[im 1/5]
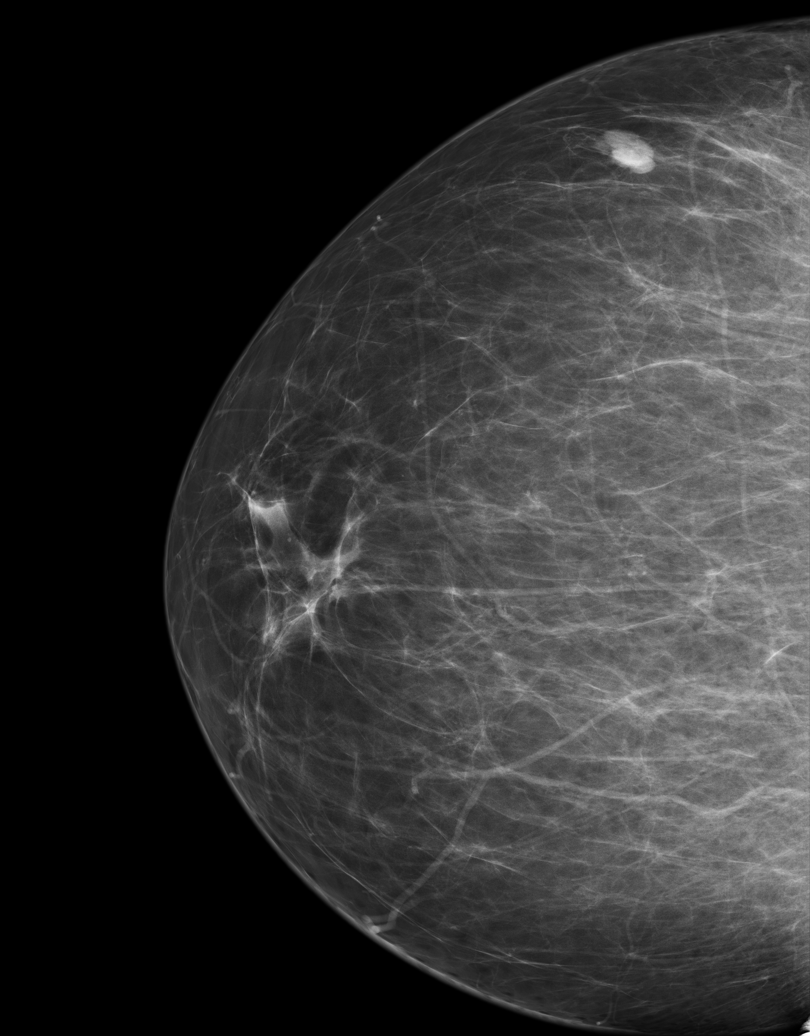
[im 2/5]
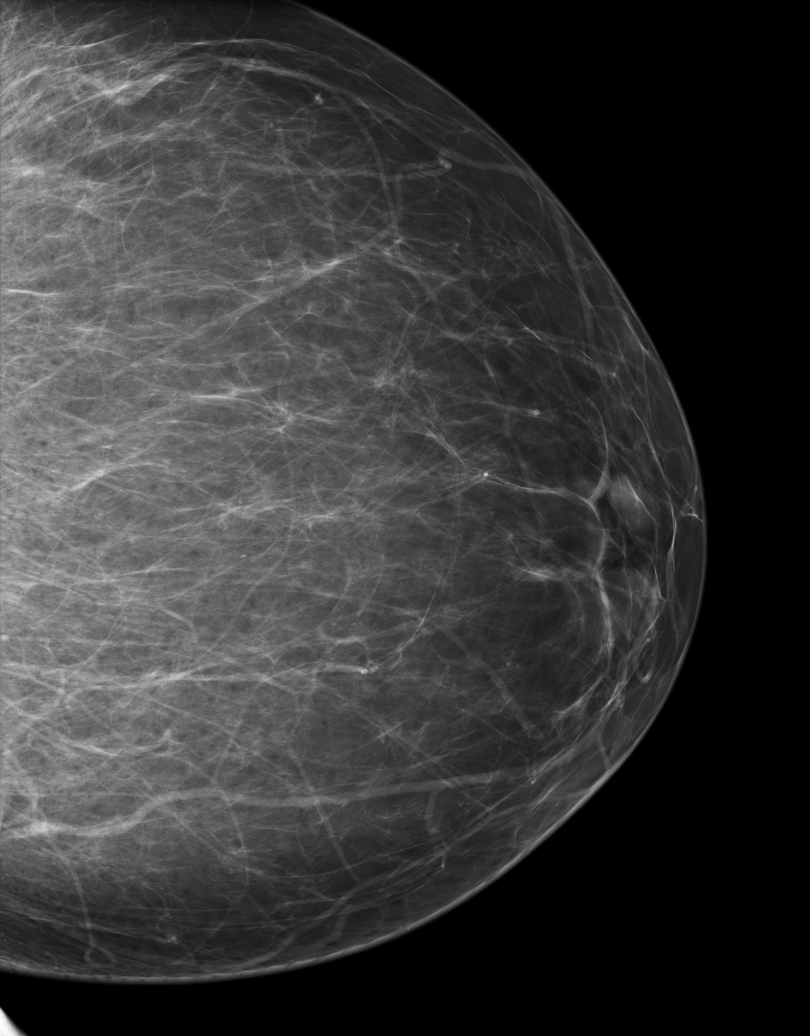
[im 3/5]
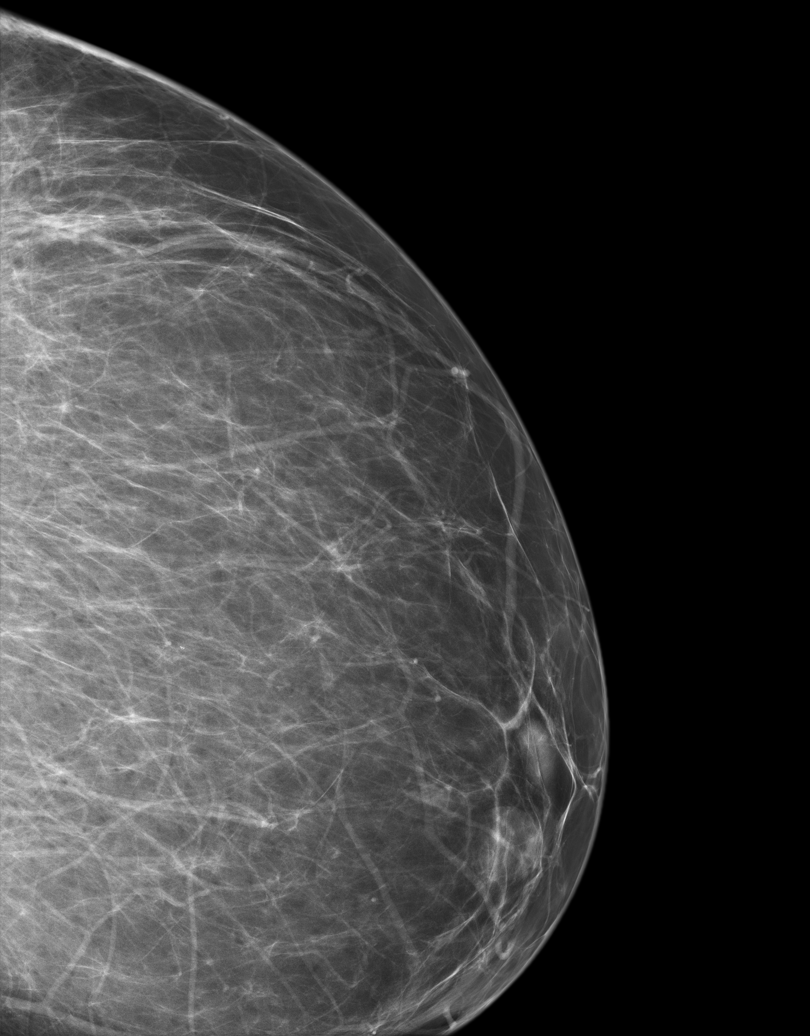
[im 4/5]
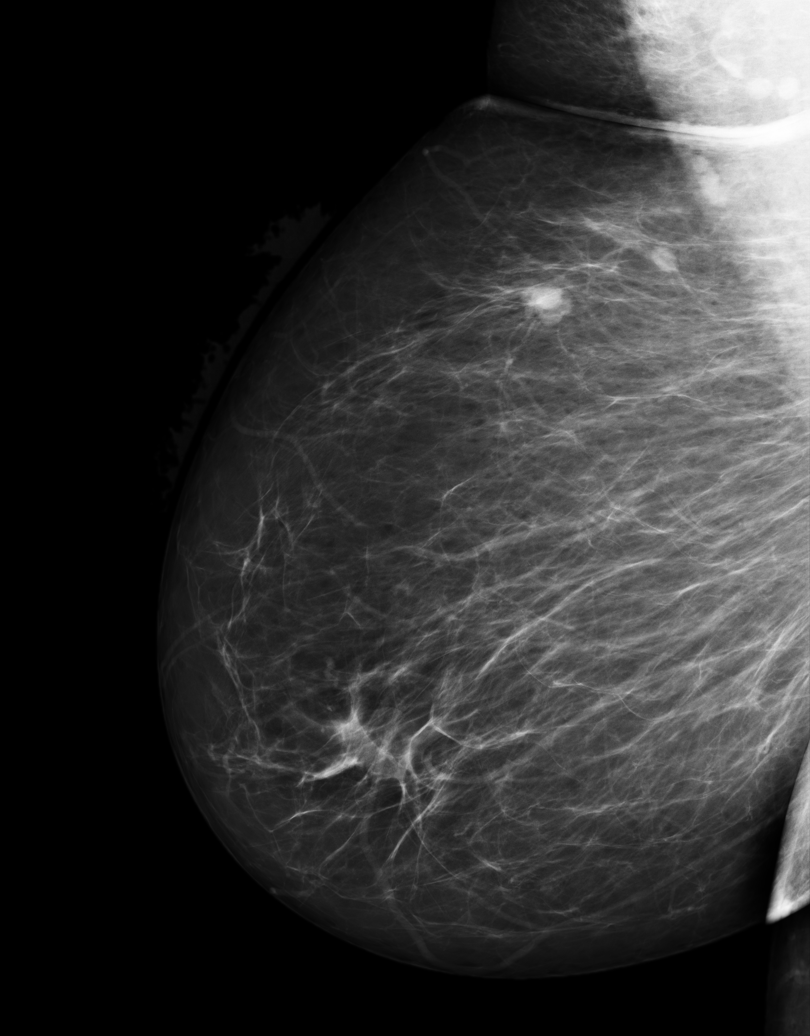
[im 5/5]
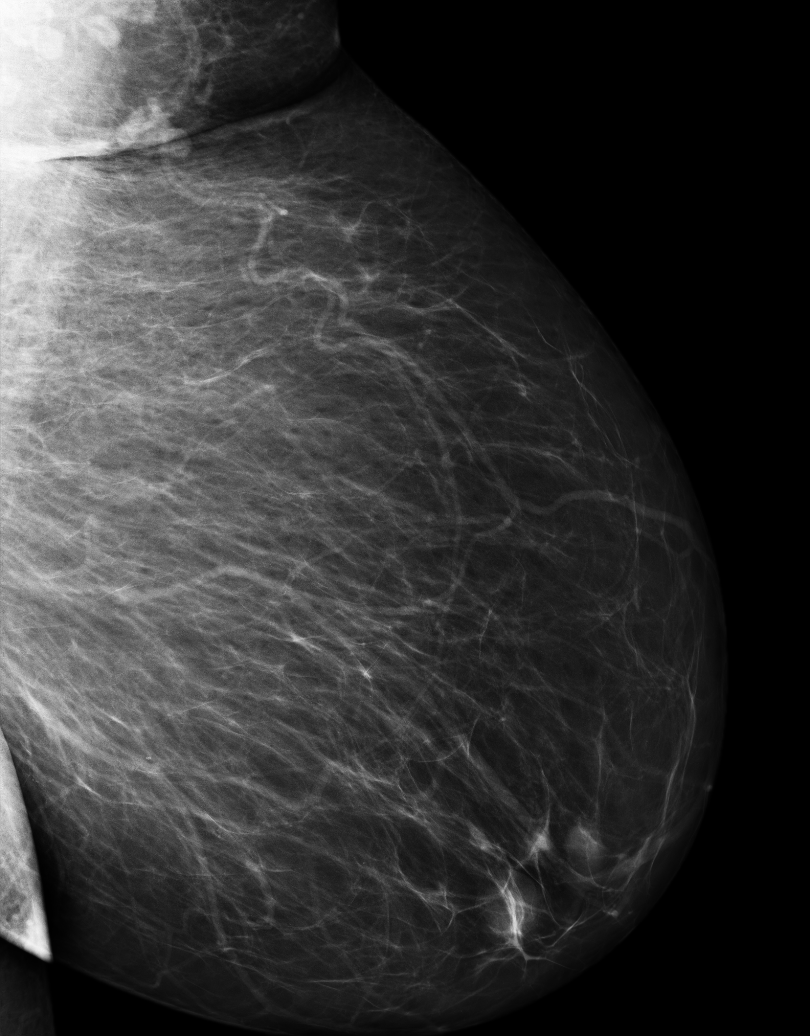

[5 of 5 positions shown; findings below may reference images not displayed]

PROCEDURE:     MAM - MAM DGTL SCREENING MAMMO W/CAD  - [DATE] [DATE]

RESULT:     This is a baseline study.

The breasts exhibit a predominantly fatty replaced parenchymal pattern with
only a small amount of residual dense parenchymal tissue present. There is
an area of lobulated soft tissue density in the superolateral aspect of the
right breast. This is most compatible with an intramammary lymph node. More
superiorly there are similar appearing areas of nodularity bilaterally. No
malignant appearing groupings of microcalcification are demonstrated. There
are no areas of architectural distortion.
IMPRESSION: There are no findings suspicious for malignancy.

BI-RADS 2: Benign findings.

Recommendation: please began yearly mammographic followup.

A NEGATIVE MAMMOGRAM REPORT DOES NOT PRECLUDE BIOPSY OR OTHER EVALUATION OF
A CLINICALLY PALPABLE OR OTHERWISE SUSPICIOUS MASS OR LESION. BREAST CANCER
MAY NOT BE DETECTED BY MAMMOGRAPHY IN UP TO 10% OF CASES.

[REDACTED]

## 2012-09-03 ENCOUNTER — Ambulatory Visit: Payer: Self-pay | Admitting: Nurse Practitioner

## 2012-09-03 IMAGING — CR DG ELBOW COMPLETE 3+V*L*
1 series · 4 of 4 positions shown · non-contrast
Comparison: none

REASON FOR EXAM: trauma to both 3-[ST] ago now pain
COMMENTS:

[Series 1: lat · 0.17mm/px · 4 of 4 slices shown]
[im 1/4]
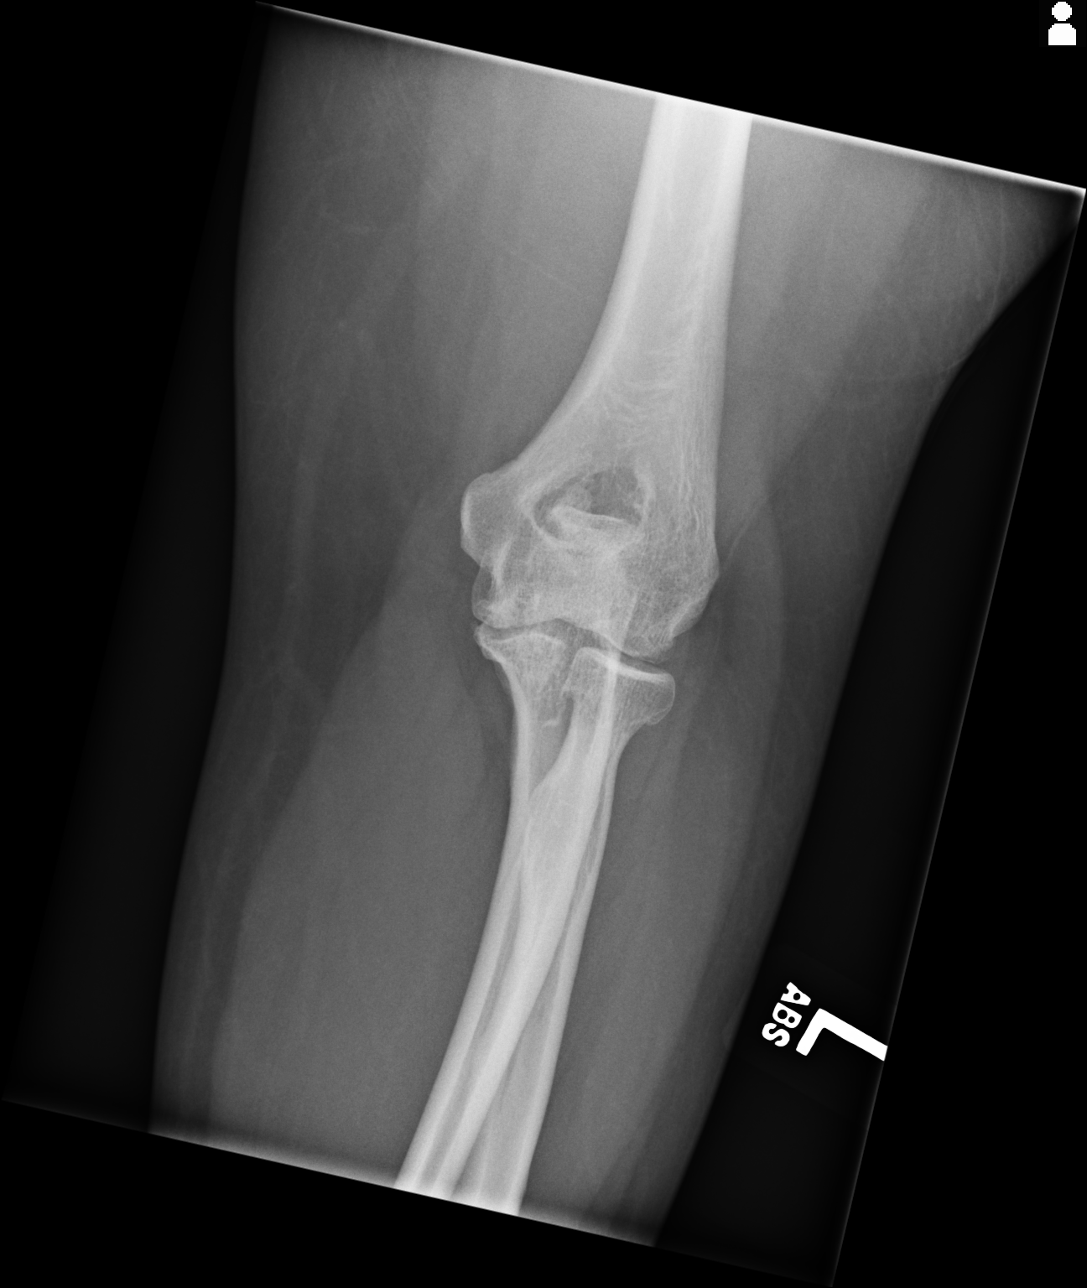
[im 2/4]
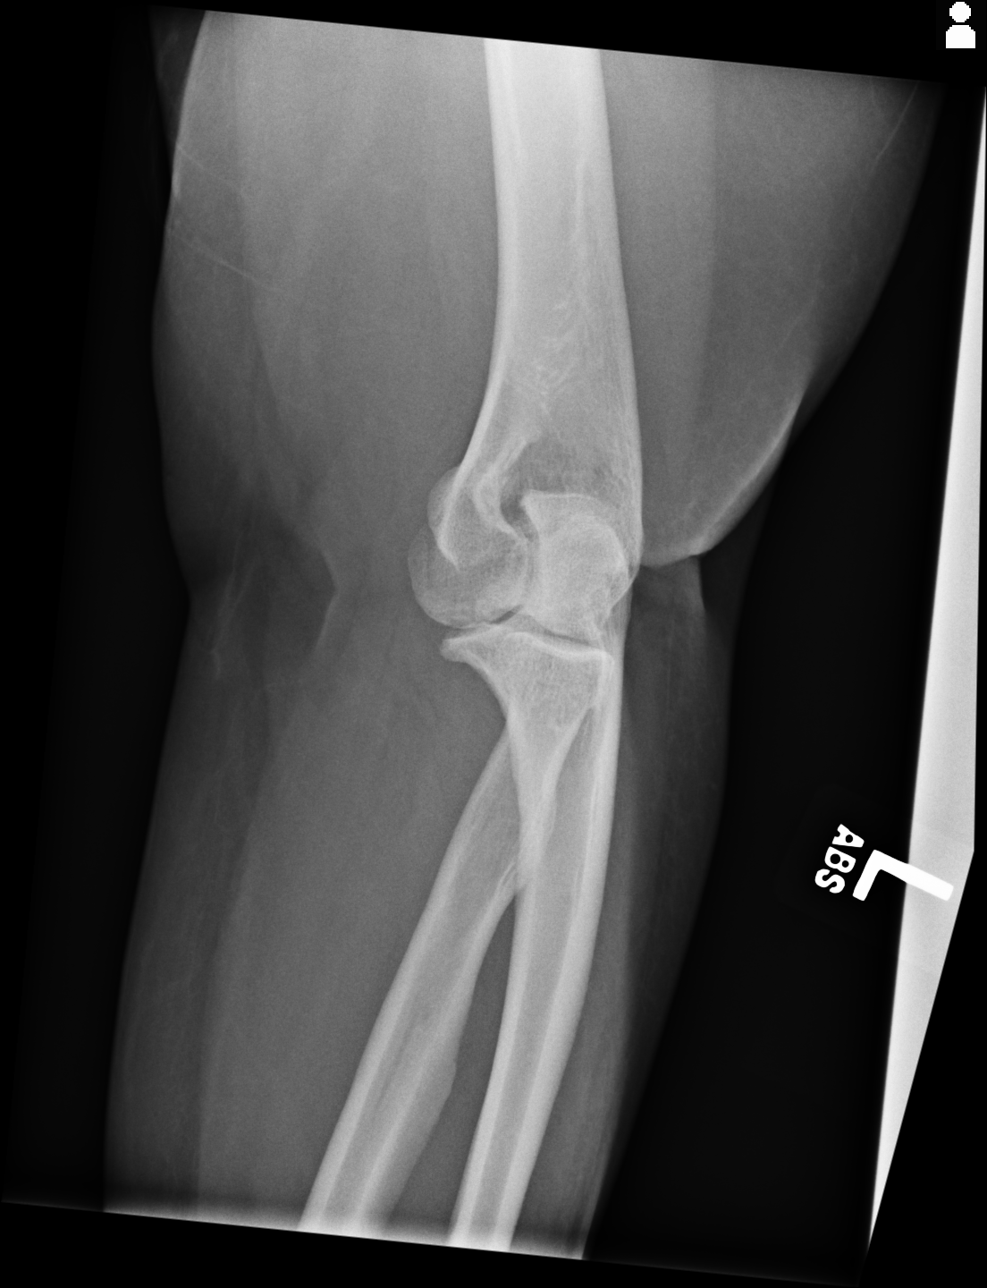
[im 3/4]
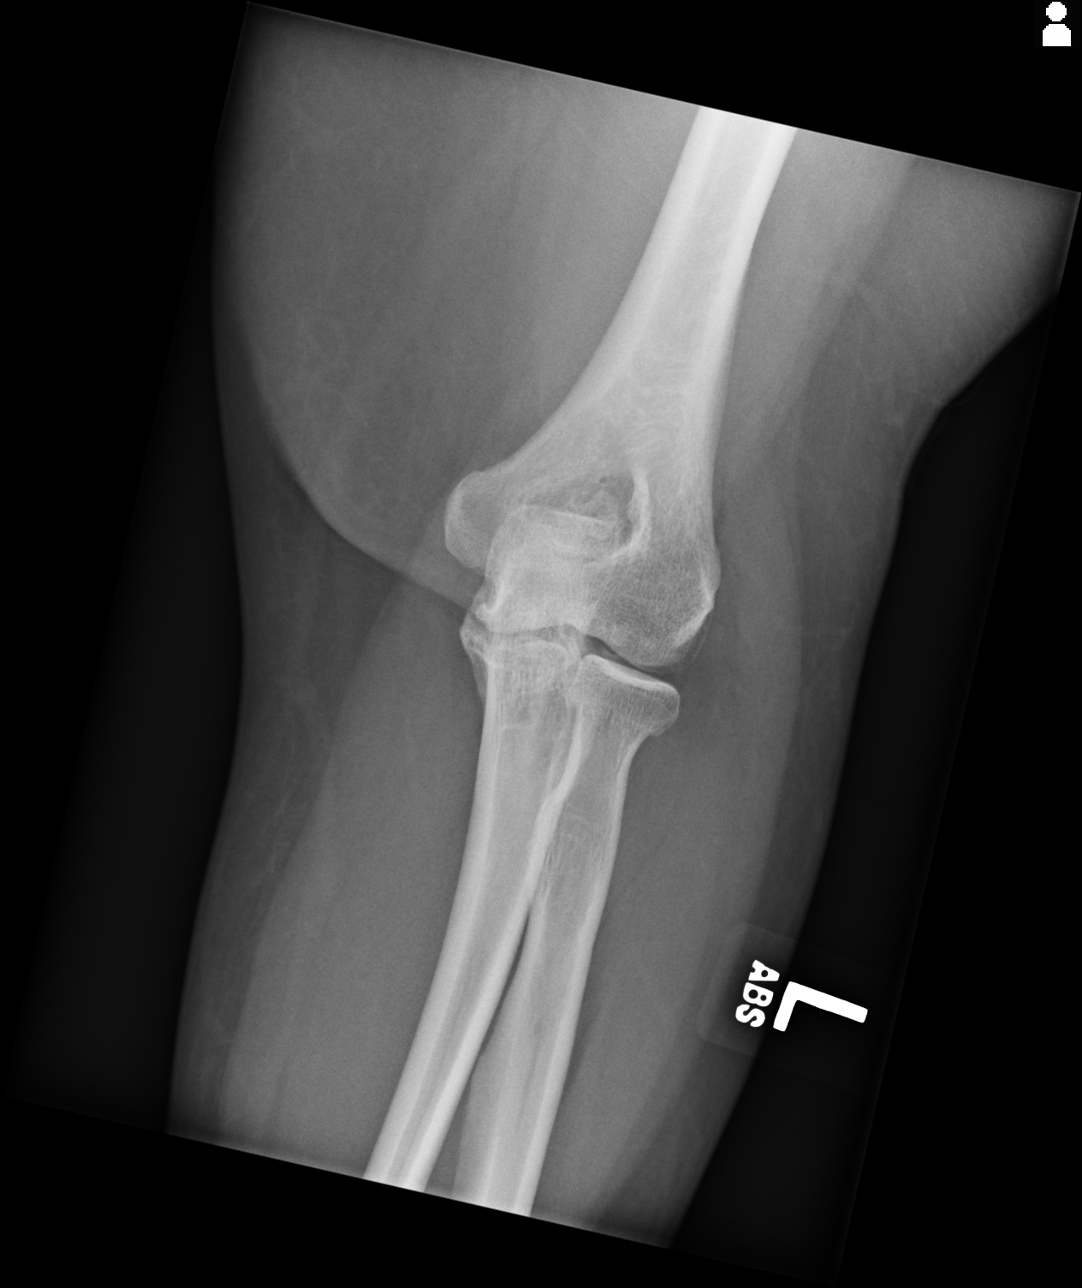
[im 4/4]
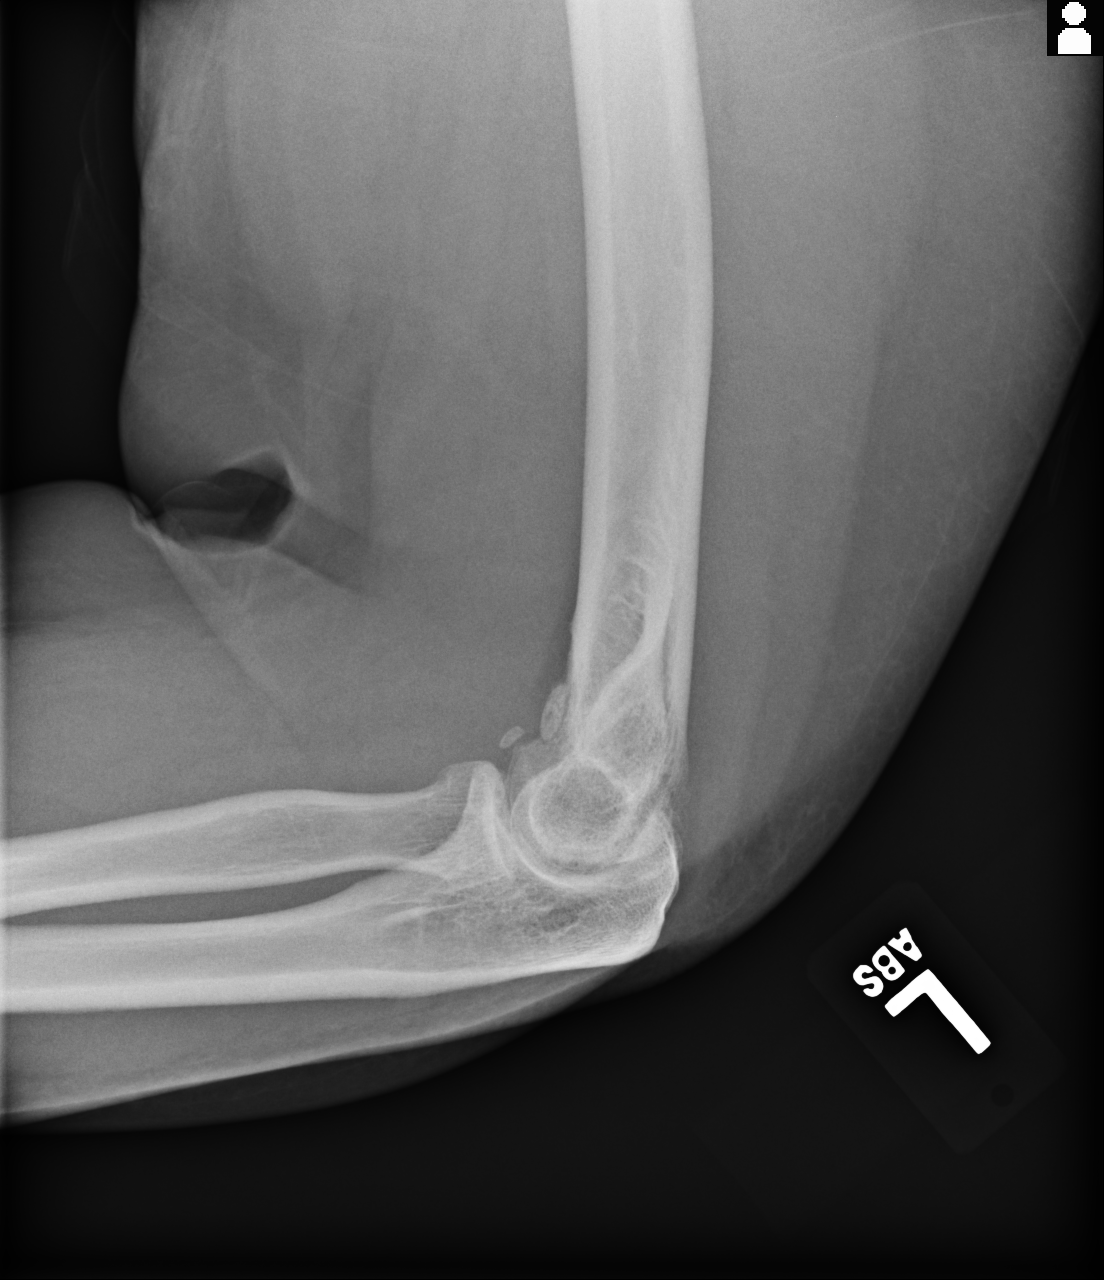

[4 of 4 positions shown; findings below may reference images not displayed]

PROCEDURE:     DXR - DXR ELBOW LT COMP W/OBLIQUES  - [DATE]  [DATE]

RESULT:

There is no evidence of acute fracture or dislocation. Osseous densities
project along the superior and inferior aspects of the joint. These may
represent loose bodies considering the patient's history of prior trauma.
Clinical correlation is recommended and, if clinically warranted, further
evaluation with MRI.
IMPRESSION: 1.  No evidence of acute osseous abnormalities.
2.  Findings may represent osteophytes or possibly even loose bodies along
the anterior aspect of the joint.

## 2012-09-03 IMAGING — CR RIGHT ELBOW - COMPLETE 3+ VIEW
1 series · 4 of 4 positions shown · non-contrast
Comparison: none

REASON FOR EXAM: trauma to both 3-[H9] ago now pain
COMMENTS:

PROCEDURE:     DXR - DXR ELBOW RT COMP W/OBLIQUES  - [DATE]  [DATE]
RESULT:

[Series 1: lat · 0.17mm/px · 4 of 4 slices shown]
[im 1/4]
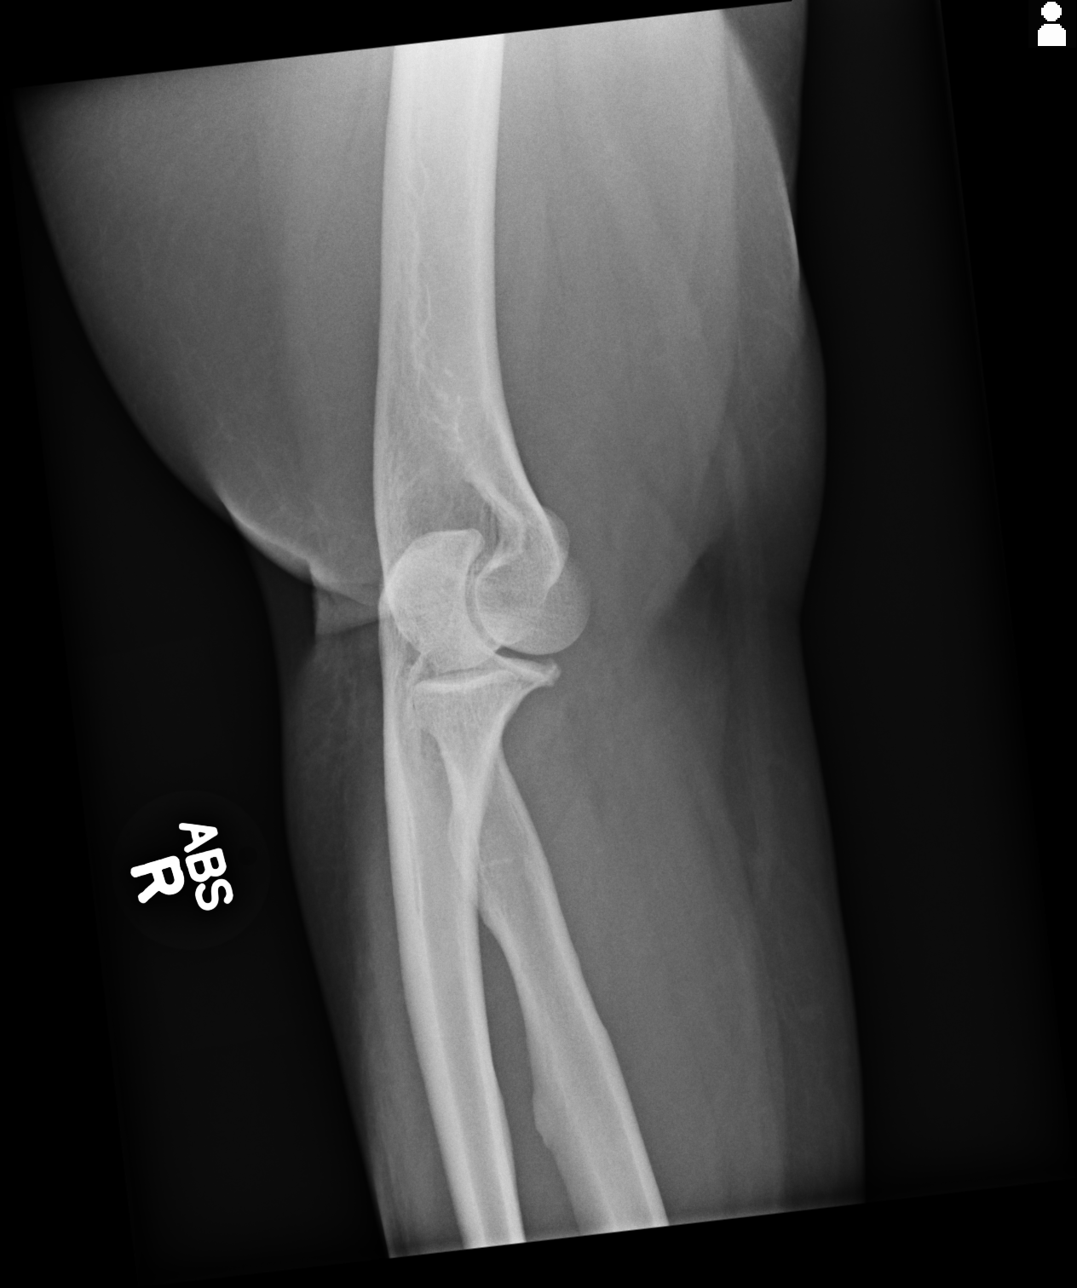
[im 2/4]
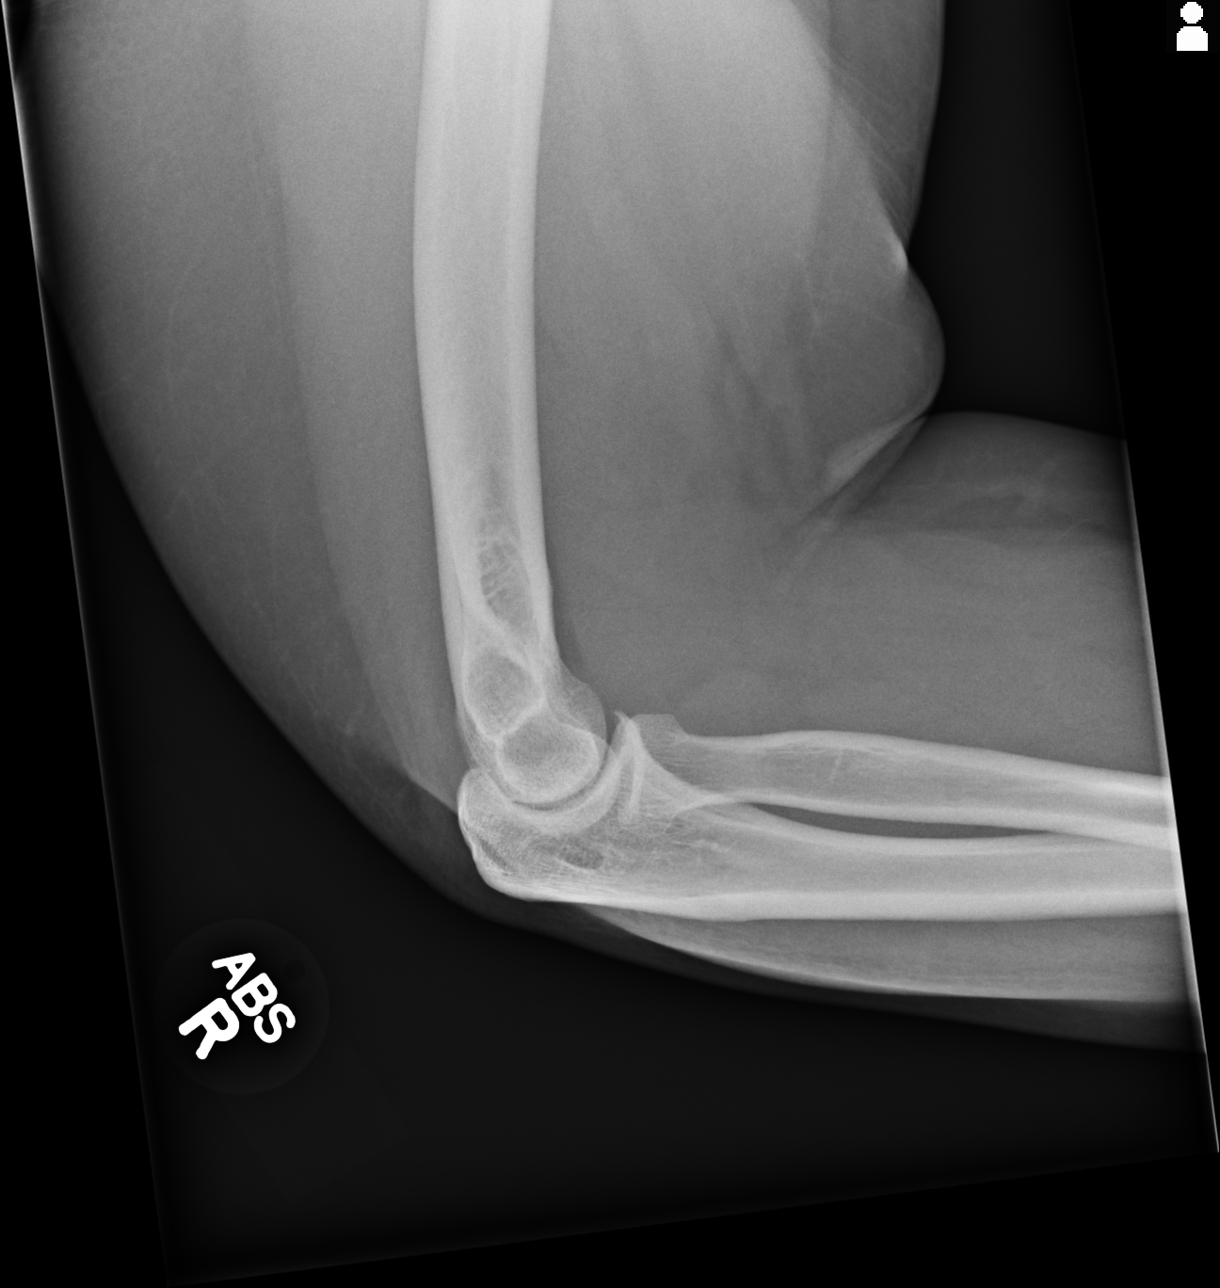
[im 3/4]
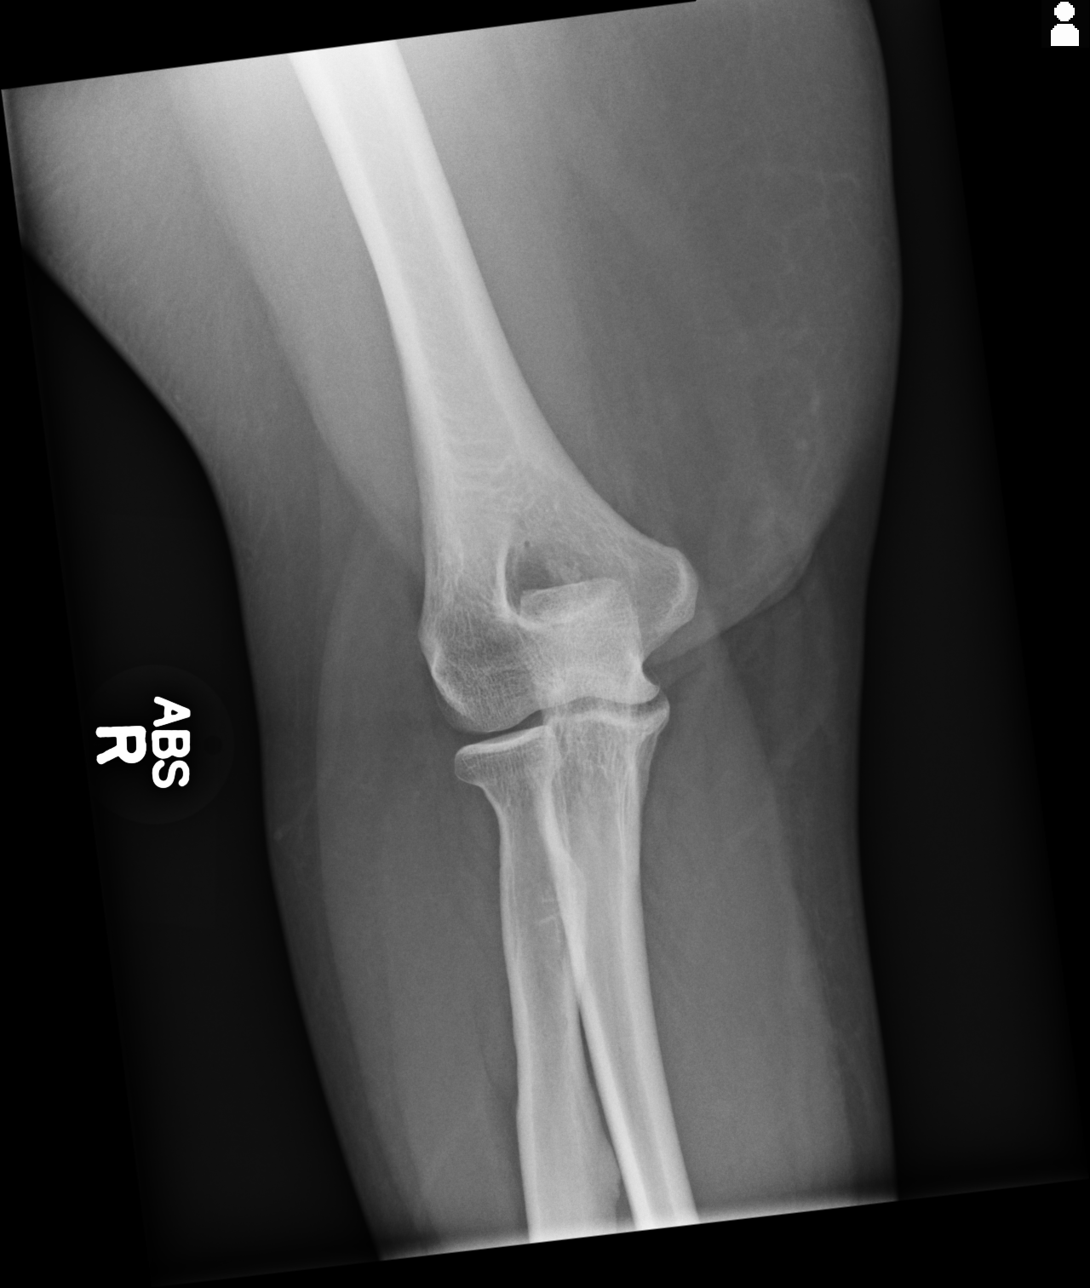
[im 4/4]
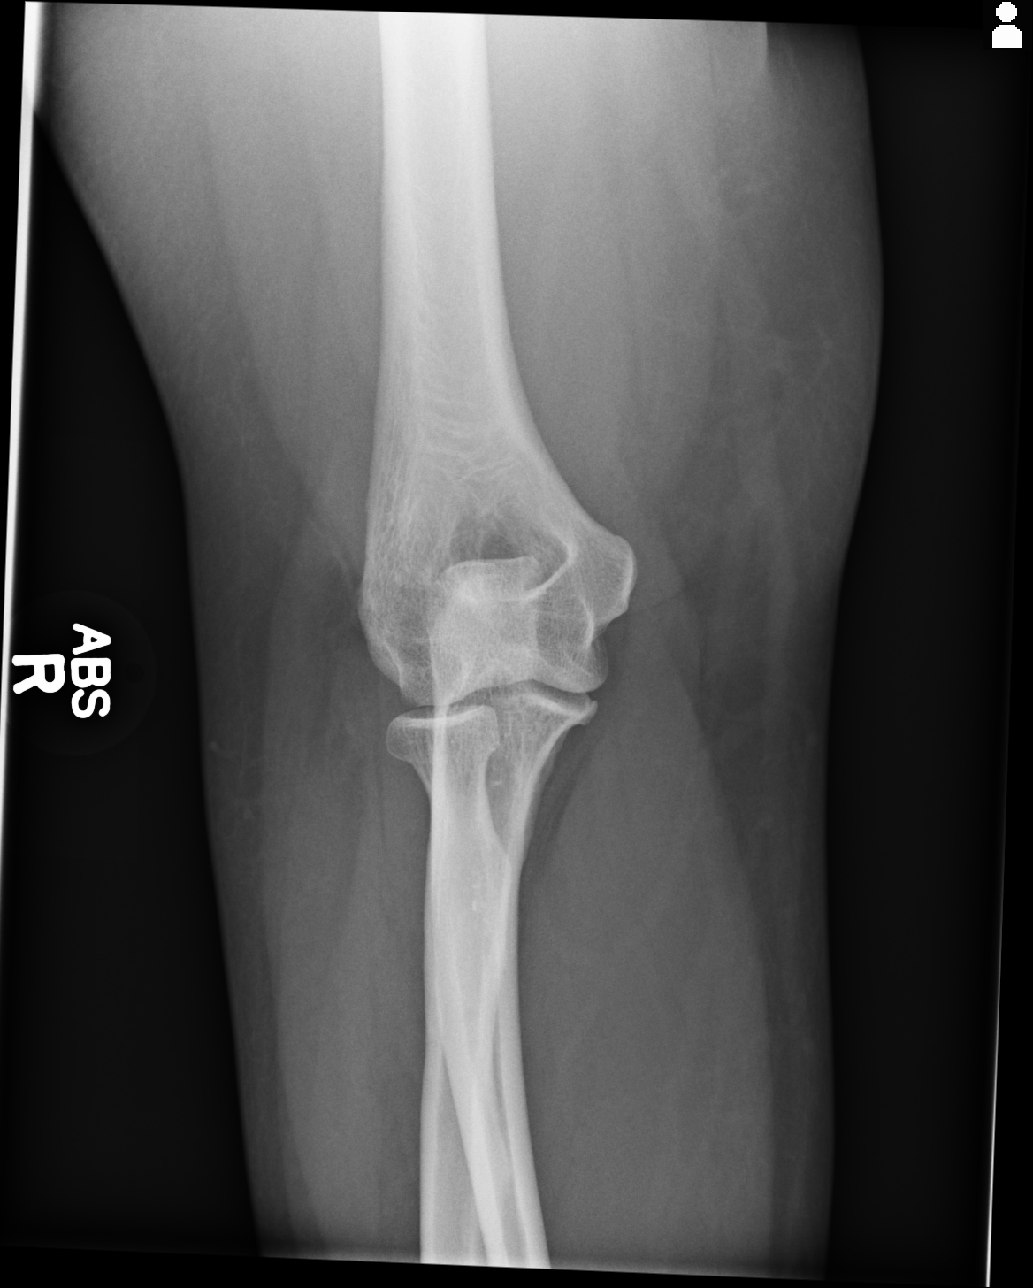

[4 of 4 positions shown; findings below may reference images not displayed]

FINDINGS: There is no evidence of acute fracture, dislocation or
malalignment. An area of osteophytosis projects along the periphery of the
olecranon.
IMPRESSION: No evidence of acute osseous abnormalities. There are
findings which represent early or mild osteoarthritic changes. If there is
persistent clinical concern, further evaluation with MRI is recommended.

## 2012-12-08 ENCOUNTER — Ambulatory Visit: Payer: Self-pay | Admitting: Oncology

## 2012-12-15 ENCOUNTER — Ambulatory Visit: Payer: Self-pay | Admitting: Oncology

## 2012-12-15 LAB — IRON AND TIBC
Iron Bind.Cap.(Total): 438 ug/dL (ref 250–450)
Iron: 30 ug/dL — ABNORMAL LOW (ref 50–170)
Unbound Iron-Bind.Cap.: 408 ug/dL

## 2012-12-15 LAB — FOLATE: Folic Acid: 9.1 ng/mL (ref 3.1–100.0)

## 2012-12-15 LAB — FERRITIN: Ferritin (ARMC): 5 ng/mL — ABNORMAL LOW (ref 8–388)

## 2012-12-15 LAB — CBC CANCER CENTER
Basophil %: 0.7 %
HCT: 32 % — ABNORMAL LOW (ref 35.0–47.0)
Lymphocyte %: 24.4 %
MCH: 23.3 pg — ABNORMAL LOW (ref 26.0–34.0)
MCHC: 31.9 g/dL — ABNORMAL LOW (ref 32.0–36.0)
MCV: 73 fL — ABNORMAL LOW (ref 80–100)
Monocyte %: 6.3 %
Platelet: 453 x10 3/mm — ABNORMAL HIGH (ref 150–440)
RDW: 18.2 % — ABNORMAL HIGH (ref 11.5–14.5)

## 2012-12-15 LAB — LACTATE DEHYDROGENASE: LDH: 223 U/L (ref 81–246)

## 2012-12-26 ENCOUNTER — Ambulatory Visit: Payer: Self-pay | Admitting: Oncology

## 2013-02-12 ENCOUNTER — Ambulatory Visit: Payer: Self-pay | Admitting: Oncology

## 2013-02-12 LAB — CBC CANCER CENTER
Basophil %: 1.5 %
Eosinophil %: 1.5 %
Lymphocyte %: 26.6 %
MCH: 26 pg (ref 26.0–34.0)
MCHC: 31.5 g/dL — ABNORMAL LOW (ref 32.0–36.0)
MCV: 83 fL (ref 80–100)
Monocyte #: 0.4 x10 3/mm (ref 0.2–0.9)
Monocyte %: 5.3 %
Neutrophil #: 5.2 x10 3/mm (ref 1.4–6.5)
Platelet: 337 x10 3/mm (ref 150–440)
RBC: 4.74 10*6/uL (ref 3.80–5.20)
RDW: 18 % — ABNORMAL HIGH (ref 11.5–14.5)

## 2013-02-12 LAB — IRON AND TIBC
Iron Bind.Cap.(Total): 360 ug/dL (ref 250–450)
Iron: 45 ug/dL — ABNORMAL LOW (ref 50–170)

## 2013-02-12 LAB — FERRITIN: Ferritin (ARMC): 16 ng/mL (ref 8–388)

## 2013-02-25 ENCOUNTER — Ambulatory Visit: Payer: Self-pay | Admitting: Oncology

## 2013-05-14 ENCOUNTER — Ambulatory Visit: Payer: Self-pay | Admitting: Oncology

## 2013-05-14 LAB — IRON AND TIBC
IRON: 34 ug/dL — AB (ref 50–170)
Iron Bind.Cap.(Total): 407 ug/dL (ref 250–450)
Iron Saturation: 8 %
UNBOUND IRON-BIND. CAP.: 373 ug/dL

## 2013-05-14 LAB — CBC CANCER CENTER
BASOS ABS: 0.1 x10 3/mm (ref 0.0–0.1)
Basophil %: 0.9 %
EOS ABS: 0.1 x10 3/mm (ref 0.0–0.7)
EOS PCT: 1.1 %
HCT: 34.3 % — ABNORMAL LOW (ref 35.0–47.0)
HGB: 10.6 g/dL — AB (ref 12.0–16.0)
LYMPHS PCT: 19.2 %
Lymphocyte #: 1.5 x10 3/mm (ref 1.0–3.6)
MCH: 25.6 pg — AB (ref 26.0–34.0)
MCHC: 30.9 g/dL — ABNORMAL LOW (ref 32.0–36.0)
MCV: 83 fL (ref 80–100)
Monocyte #: 0.6 x10 3/mm (ref 0.2–0.9)
Monocyte %: 7.9 %
NEUTROS ABS: 5.6 x10 3/mm (ref 1.4–6.5)
Neutrophil %: 70.9 %
Platelet: 431 x10 3/mm (ref 150–440)
RBC: 4.15 10*6/uL (ref 3.80–5.20)
RDW: 13.5 % (ref 11.5–14.5)
WBC: 7.9 x10 3/mm (ref 3.6–11.0)

## 2013-05-14 LAB — FERRITIN: FERRITIN (ARMC): 5 ng/mL — AB (ref 8–388)

## 2013-05-26 ENCOUNTER — Ambulatory Visit: Payer: Self-pay | Admitting: Oncology

## 2013-07-29 ENCOUNTER — Ambulatory Visit: Payer: Self-pay | Admitting: Nurse Practitioner

## 2013-10-11 ENCOUNTER — Ambulatory Visit: Payer: Self-pay | Admitting: Oncology

## 2013-10-11 LAB — CBC CANCER CENTER
BASOS PCT: 0.8 %
Basophil #: 0.1 x10 3/mm (ref 0.0–0.1)
Eosinophil #: 0.1 x10 3/mm (ref 0.0–0.7)
Eosinophil %: 1.4 %
HCT: 31.9 % — ABNORMAL LOW (ref 35.0–47.0)
HGB: 9.9 g/dL — ABNORMAL LOW (ref 12.0–16.0)
LYMPHS ABS: 1.7 x10 3/mm (ref 1.0–3.6)
Lymphocyte %: 18.5 %
MCH: 23.8 pg — AB (ref 26.0–34.0)
MCHC: 30.9 g/dL — ABNORMAL LOW (ref 32.0–36.0)
MCV: 77 fL — AB (ref 80–100)
MONOS PCT: 6.6 %
Monocyte #: 0.6 x10 3/mm (ref 0.2–0.9)
NEUTROS ABS: 6.8 x10 3/mm — AB (ref 1.4–6.5)
Neutrophil %: 72.7 %
Platelet: 493 x10 3/mm — ABNORMAL HIGH (ref 150–440)
RBC: 4.14 10*6/uL (ref 3.80–5.20)
RDW: 15.5 % — ABNORMAL HIGH (ref 11.5–14.5)
WBC: 9.3 x10 3/mm (ref 3.6–11.0)

## 2013-10-11 LAB — IRON AND TIBC
IRON SATURATION: 6 %
Iron Bind.Cap.(Total): 413 ug/dL (ref 250–450)
Iron: 23 ug/dL — ABNORMAL LOW (ref 50–170)
Unbound Iron-Bind.Cap.: 390 ug/dL

## 2013-10-11 LAB — FERRITIN: Ferritin (ARMC): 5 ng/mL — ABNORMAL LOW (ref 8–388)

## 2013-10-26 ENCOUNTER — Ambulatory Visit: Payer: Self-pay | Admitting: Oncology

## 2013-11-21 ENCOUNTER — Emergency Department: Payer: Self-pay | Admitting: Emergency Medicine

## 2013-11-21 LAB — CBC
HCT: 38.6 % (ref 35.0–47.0)
HGB: 11.6 g/dL — ABNORMAL LOW (ref 12.0–16.0)
MCH: 25.4 pg — AB (ref 26.0–34.0)
MCHC: 30.2 g/dL — ABNORMAL LOW (ref 32.0–36.0)
MCV: 84 fL (ref 80–100)
PLATELETS: 314 10*3/uL (ref 150–440)
RBC: 4.59 10*6/uL (ref 3.80–5.20)
RDW: 21.6 % — AB (ref 11.5–14.5)
WBC: 7.9 10*3/uL (ref 3.6–11.0)

## 2013-11-21 LAB — BASIC METABOLIC PANEL
ANION GAP: 7 (ref 7–16)
BUN: 16 mg/dL (ref 7–18)
CHLORIDE: 98 mmol/L (ref 98–107)
CO2: 30 mmol/L (ref 21–32)
Calcium, Total: 8.7 mg/dL (ref 8.5–10.1)
Creatinine: 0.85 mg/dL (ref 0.60–1.30)
EGFR (Non-African Amer.): >60
GLUCOSE: 287 mg/dL — AB (ref 65–99)
OSMOLALITY: 282 (ref 275–301)
POTASSIUM: 4.1 mmol/L (ref 3.5–5.1)
SODIUM: 135 mmol/L — AB (ref 136–145)

## 2013-11-21 IMAGING — US US EXTREM LOW VENOUS*L*
1 series · 13 of 24 positions shown · non-contrast
Comparison: None.

CLINICAL DATA: Left lower extremity pain and edema



[Series 1: us extrem low venous*left* · 0.08mm/px · 13 of 34 slices shown]
[im 1/34]
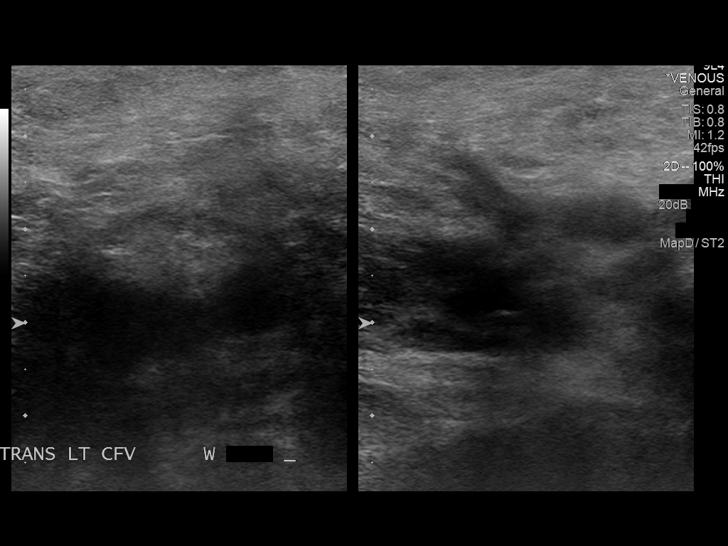
[im 3/34]
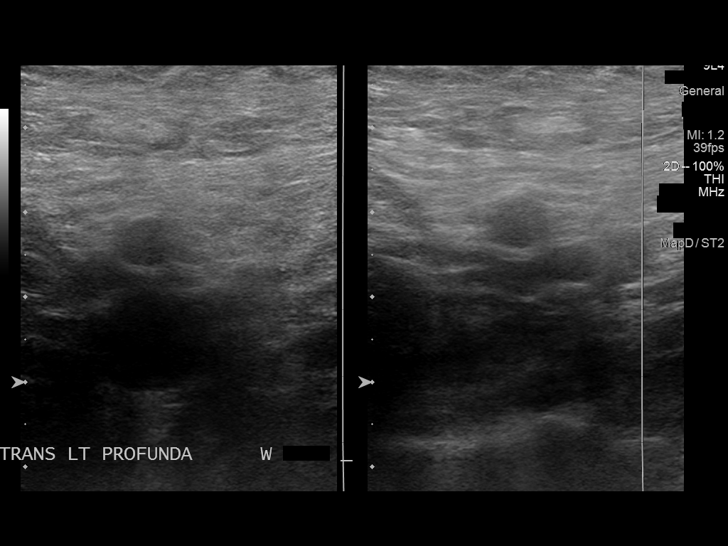
[im 6/34]
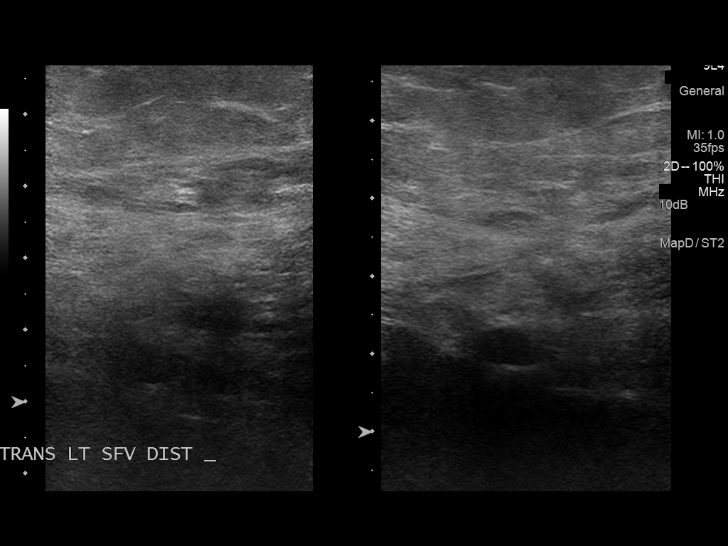
[im 9/34]
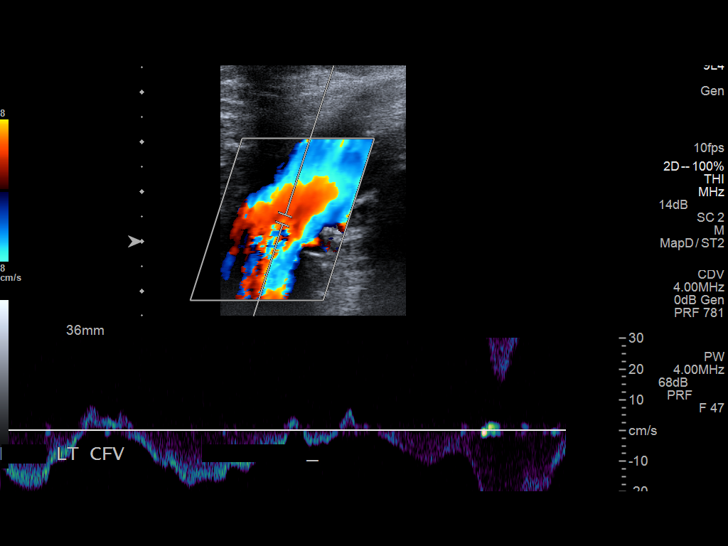
[im 12/34]
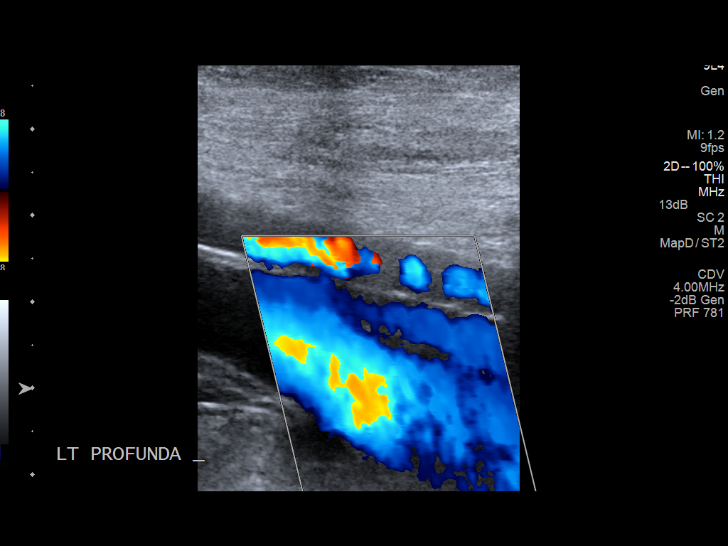
[im 15/34]
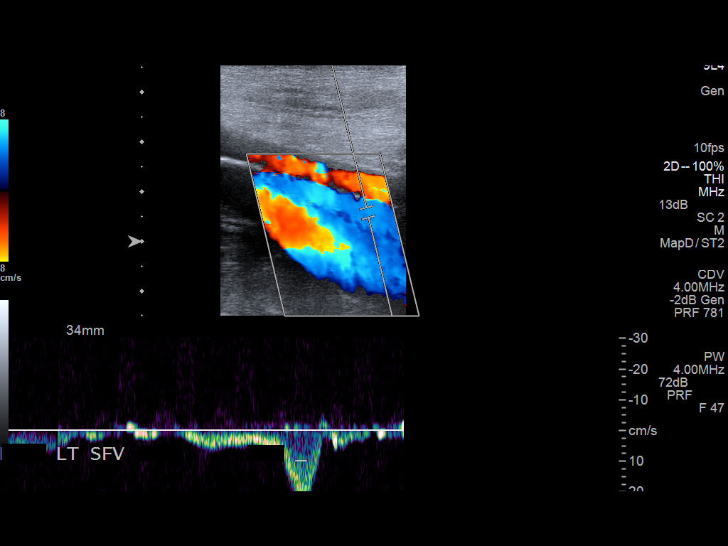
[im 18/34]
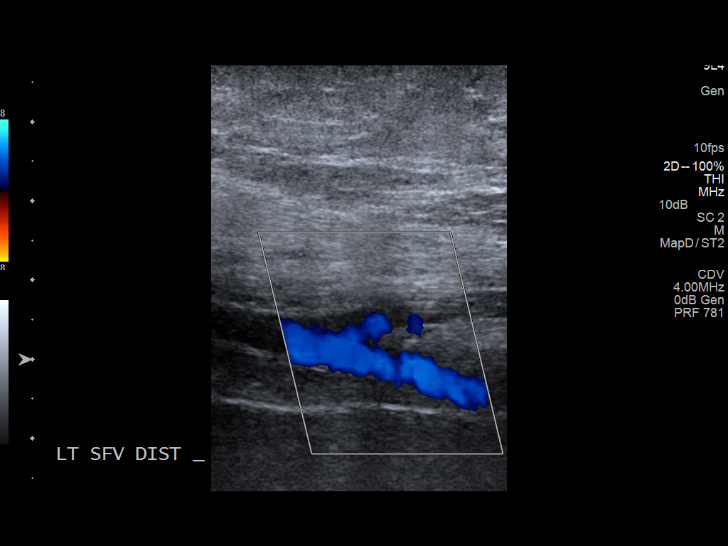
[im 19/34]
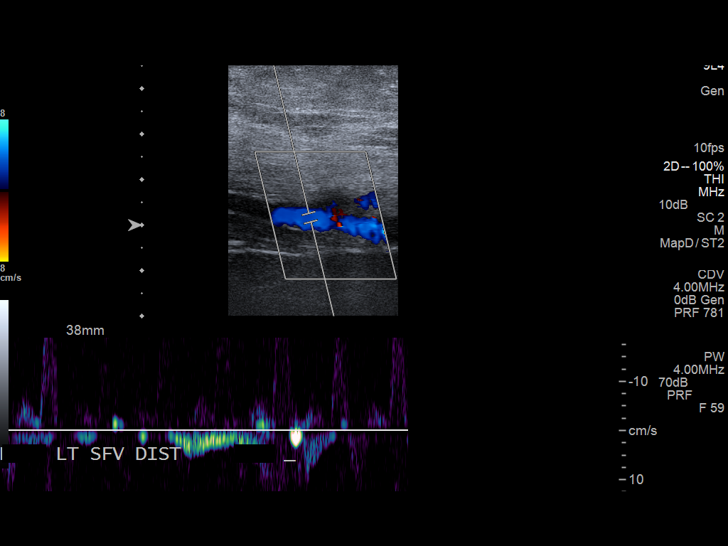
[im 22/34]
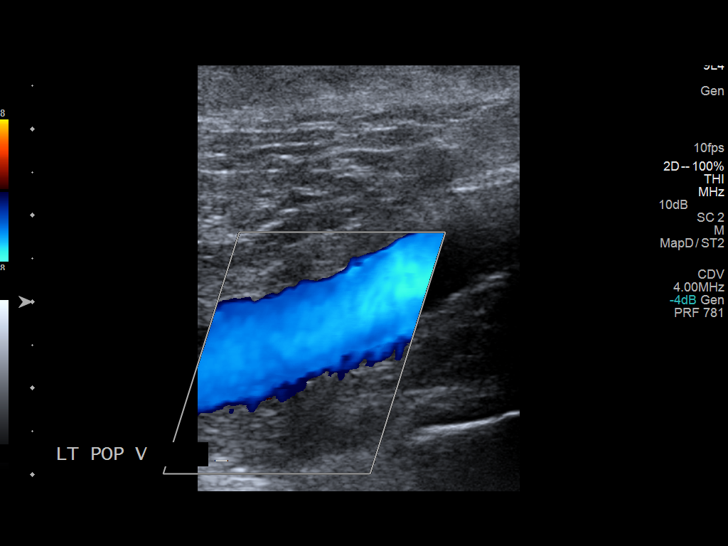
[im 25/34]
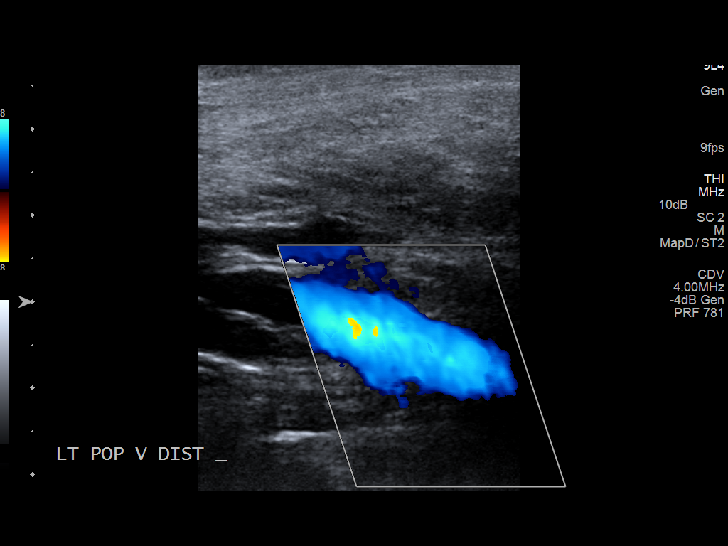
[im 28/34]
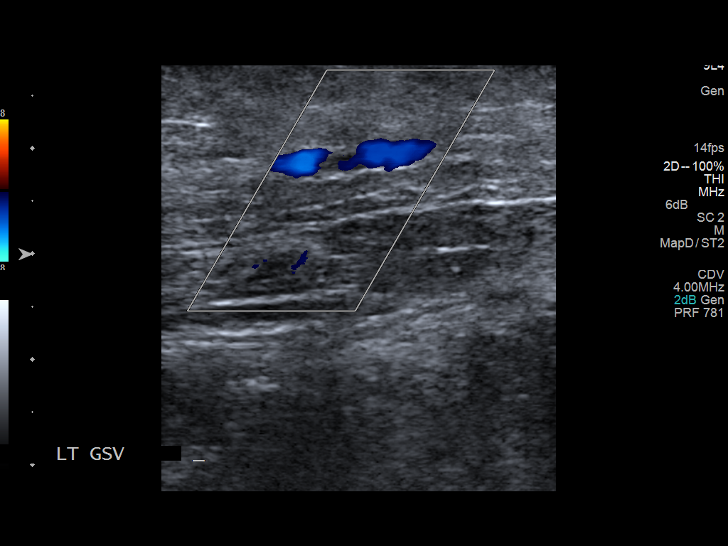
[im 31/34]
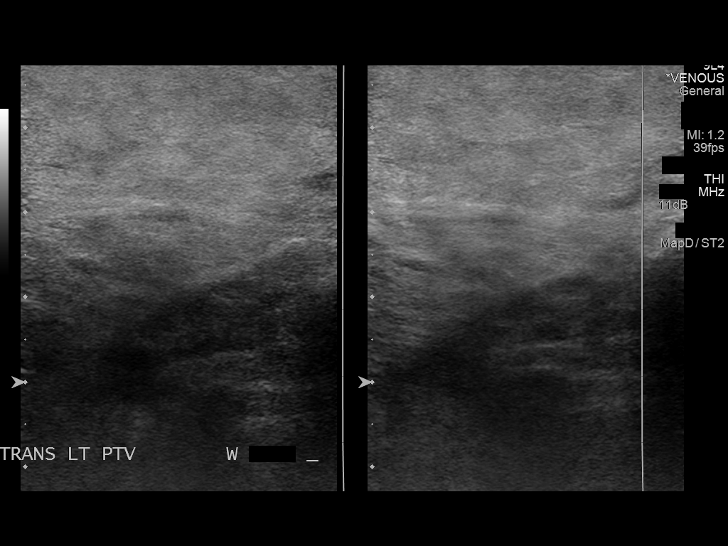
[im 34/34]
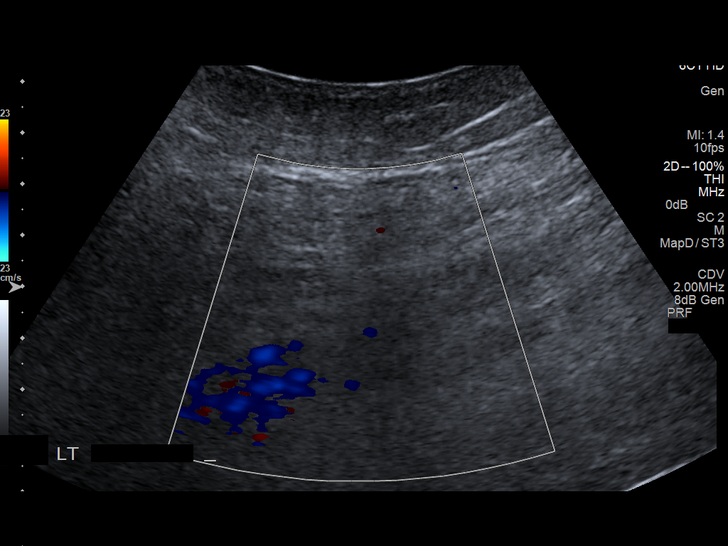

[13 of 24 positions shown; findings below may reference images not displayed]

FINDINGS: Common Femoral Vein: No evidence of thrombus. Normal
compressibility, respiratory phasicity and response to augmentation.

Saphenofemoral Junction: No evidence of thrombus. Normal
compressibility and flow on color Doppler imaging.

Profunda Femoral Vein: No evidence of thrombus. Normal
compressibility and flow on color Doppler imaging.

Femoral Vein: No evidence of thrombus. Normal compressibility,
respiratory phasicity and response to augmentation.

Popliteal Vein: No evidence of thrombus. Normal compressibility,
respiratory phasicity and response to augmentation.

Calf Veins: No evidence of thrombus. Normal compressibility and flow
on color Doppler imaging.

Superficial Great Saphenous Vein: No evidence of thrombus. Normal
compressibility and flow on color Doppler imaging.

Venous Reflux:  None.

Other Findings:  None.
IMPRESSION: No evidence of deep venous thrombosis.

## 2014-01-11 ENCOUNTER — Ambulatory Visit: Payer: Self-pay | Admitting: Oncology

## 2014-01-11 LAB — CBC CANCER CENTER
Basophil #: 0.1 "x10 3/mm "
Basophil %: 1.3 %
Eosinophil #: 0.2 "x10 3/mm "
Eosinophil %: 2.4 %
HCT: 39.2 %
HGB: 12.4 g/dL
Lymphocyte %: 25.9 %
Lymphs Abs: 1.8 "x10 3/mm "
MCH: 27 pg
MCHC: 31.6 g/dL — ABNORMAL LOW
MCV: 86 fL
Monocyte #: 0.5 "x10 3/mm "
Monocyte %: 6.6 %
Neutrophil #: 4.6 "x10 3/mm "
Neutrophil %: 63.8 %
Platelet: 370 "x10 3/mm "
RBC: 4.58 "x10 6/mm "
RDW: 15.1 % — ABNORMAL HIGH
WBC: 7.1 "x10 3/mm "

## 2014-01-11 LAB — IRON AND TIBC
IRON BIND. CAP.(TOTAL): 342 ug/dL (ref 250–450)
IRON SATURATION: 8 %
Iron: 26 ug/dL — ABNORMAL LOW (ref 50–170)
Unbound Iron-Bind.Cap.: 316 ug/dL

## 2014-01-11 LAB — FERRITIN: Ferritin (ARMC): 11 ng/mL

## 2014-01-25 ENCOUNTER — Ambulatory Visit: Payer: Self-pay | Admitting: Oncology

## 2014-10-28 ENCOUNTER — Encounter: Payer: Self-pay | Admitting: Emergency Medicine

## 2014-10-28 ENCOUNTER — Emergency Department
Admission: EM | Admit: 2014-10-28 | Discharge: 2014-10-28 | Disposition: A | Discharge status: Home / Self Care | Payer: Medicaid Other | Attending: Emergency Medicine | Admitting: Emergency Medicine

## 2014-10-28 ENCOUNTER — Emergency Department: Payer: Medicaid Other

## 2014-10-28 DIAGNOSIS — R109 Unspecified abdominal pain: Secondary | ICD-10-CM | POA: Diagnosis present

## 2014-10-28 DIAGNOSIS — I10 Essential (primary) hypertension: Secondary | ICD-10-CM | POA: Diagnosis not present

## 2014-10-28 DIAGNOSIS — E1165 Type 2 diabetes mellitus with hyperglycemia: Secondary | ICD-10-CM | POA: Insufficient documentation

## 2014-10-28 DIAGNOSIS — N3090 Cystitis, unspecified without hematuria: Secondary | ICD-10-CM | POA: Insufficient documentation

## 2014-10-28 DIAGNOSIS — L989 Disorder of the skin and subcutaneous tissue, unspecified: Secondary | ICD-10-CM | POA: Diagnosis not present

## 2014-10-28 DIAGNOSIS — R739 Hyperglycemia, unspecified: Secondary | ICD-10-CM

## 2014-10-28 HISTORY — DX: Essential (primary) hypertension: I10

## 2014-10-28 HISTORY — DX: Unspecified asthma, uncomplicated: J45.909

## 2014-10-28 HISTORY — DX: Type 2 diabetes mellitus without complications: E11.9

## 2014-10-28 LAB — CBC WITH DIFFERENTIAL/PLATELET
Basophils Absolute: 0.1 10*3/uL (ref 0–0.1)
Basophils Relative: 1 %
EOS ABS: 0.2 10*3/uL (ref 0–0.7)
Eosinophils Relative: 2 %
HCT: 31.8 % — ABNORMAL LOW (ref 35.0–47.0)
HEMOGLOBIN: 9.8 g/dL — AB (ref 12.0–16.0)
LYMPHS ABS: 1.8 10*3/uL (ref 1.0–3.6)
Lymphocytes Relative: 19 %
MCH: 22.3 pg — AB (ref 26.0–34.0)
MCHC: 30.7 g/dL — AB (ref 32.0–36.0)
MCV: 72.6 fL — ABNORMAL LOW (ref 80.0–100.0)
MONOS PCT: 7 %
Monocytes Absolute: 0.7 10*3/uL (ref 0.2–0.9)
NEUTROS PCT: 71 %
Neutro Abs: 6.4 10*3/uL (ref 1.4–6.5)
Platelets: 444 10*3/uL — ABNORMAL HIGH (ref 150–440)
RBC: 4.38 MIL/uL (ref 3.80–5.20)
RDW: 15.7 % — ABNORMAL HIGH (ref 11.5–14.5)
WBC: 9.2 10*3/uL (ref 3.6–11.0)

## 2014-10-28 LAB — COMPREHENSIVE METABOLIC PANEL
ALK PHOS: 97 U/L (ref 38–126)
ALT: 18 U/L (ref 14–54)
ANION GAP: 8 (ref 5–15)
AST: 22 U/L (ref 15–41)
Albumin: 2.6 g/dL — ABNORMAL LOW (ref 3.5–5.0)
BUN: 21 mg/dL — ABNORMAL HIGH (ref 6–20)
CALCIUM: 8.4 mg/dL — AB (ref 8.9–10.3)
CO2: 32 mmol/L (ref 22–32)
Chloride: 96 mmol/L — ABNORMAL LOW (ref 101–111)
Creatinine, Ser: 1.42 mg/dL — ABNORMAL HIGH (ref 0.44–1.00)
GFR, EST AFRICAN AMERICAN: 52 mL/min — AB (ref >60)
GFR, EST NON AFRICAN AMERICAN: 44 mL/min — AB (ref >60)
Glucose, Bld: 192 mg/dL — ABNORMAL HIGH (ref 65–99)
Potassium: 3.4 mmol/L — ABNORMAL LOW (ref 3.5–5.1)
SODIUM: 136 mmol/L (ref 135–145)
TOTAL PROTEIN: 6.1 g/dL — AB (ref 6.5–8.1)

## 2014-10-28 LAB — URINALYSIS COMPLETE WITH MICROSCOPIC (ARMC ONLY)
BILIRUBIN URINE: NEGATIVE
KETONES UR: NEGATIVE mg/dL
Leukocytes, UA: NEGATIVE
NITRITE: NEGATIVE
Protein, ur: >500 mg/dL — AB
Specific Gravity, Urine: 1.02 (ref 1.005–1.030)
pH: 5 (ref 5.0–8.0)

## 2014-10-28 LAB — LIPASE, BLOOD: LIPASE: 26 U/L (ref 22–51)

## 2014-10-28 IMAGING — CT CT RENAL STONE PROTOCOL
1 of 2 series · 15 of 32 positions shown, 19 images · non-contrast
Comparison: None.

CLINICAL DATA: 44-year-old female with right flank and abdominal
pain for 3 months, worsening over the last 2 days. Nausea and
vomiting.

EXAM:
CT ABDOMEN AND PELVIS WITHOUT CONTRAST
TECHNIQUE: Multidetector CT imaging of the abdomen and pelvis was performed
following the standard protocol without IV contrast.

[Series 2: stone standard full · axial · 0.80mm/px · z∈[-1454,-1029]mm · 15 of 93 slices shown, 19 images]
[im 4/93  soft-tissue]
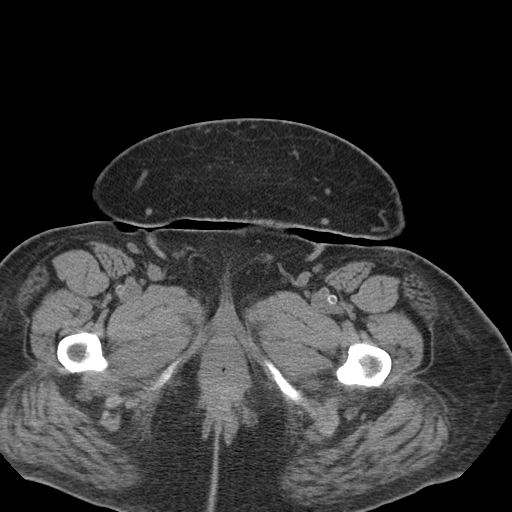
[im 4/93  bone]
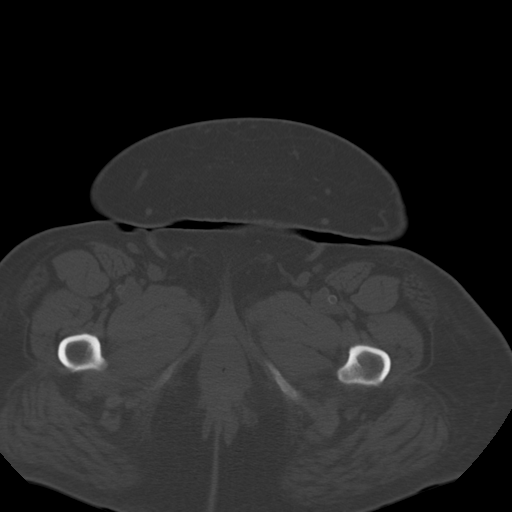
[im 12/93  soft-tissue]
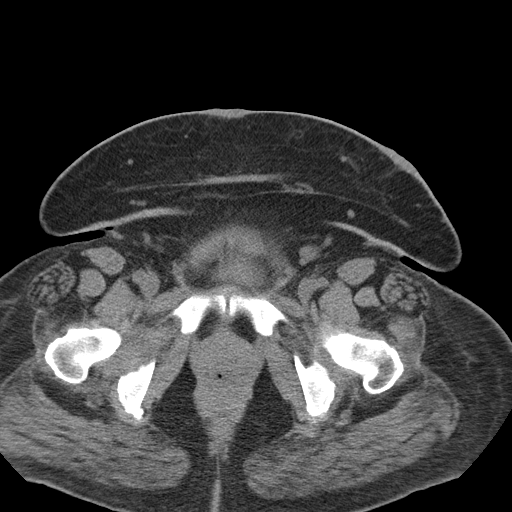
[im 20/93  soft-tissue]
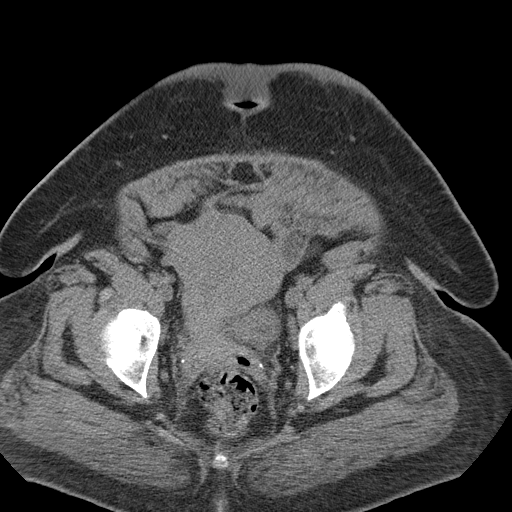
[im 27/93  soft-tissue]
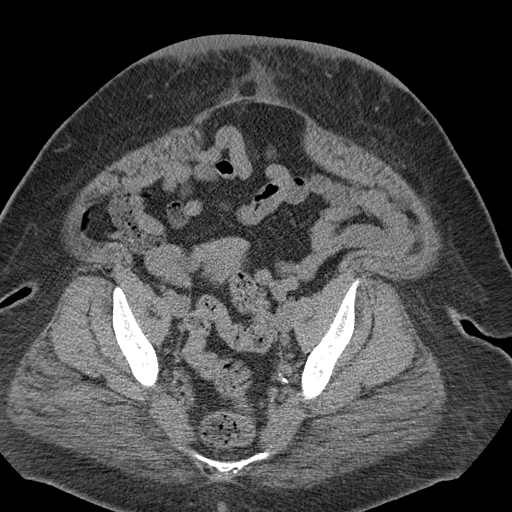
[im 31/93  soft-tissue]
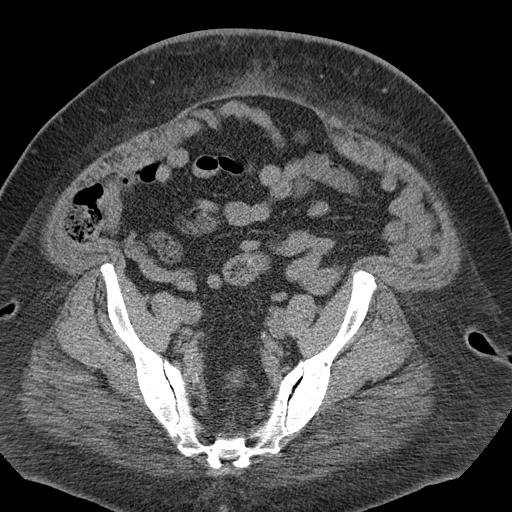
[im 39/93  soft-tissue]
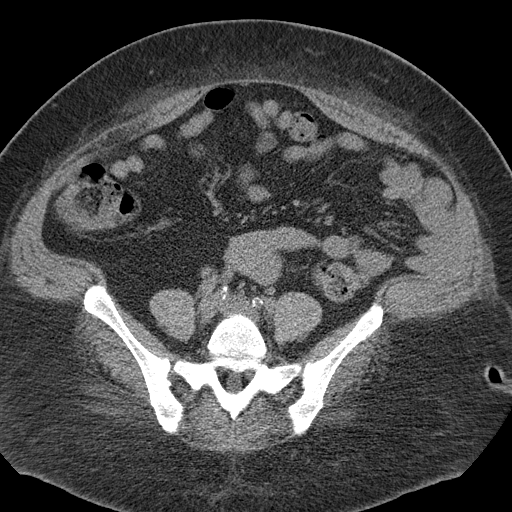
[im 47/93  soft-tissue]
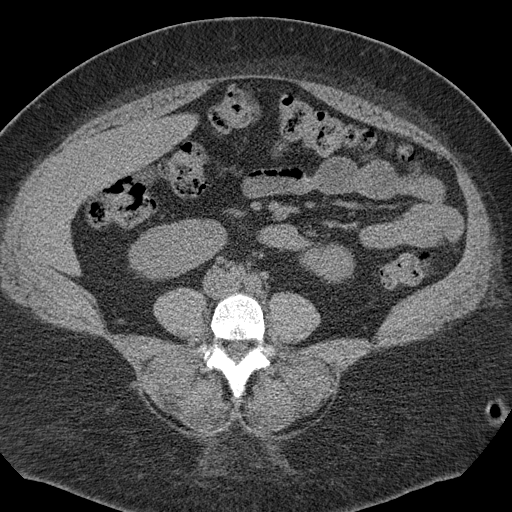
[im 54/93  soft-tissue]
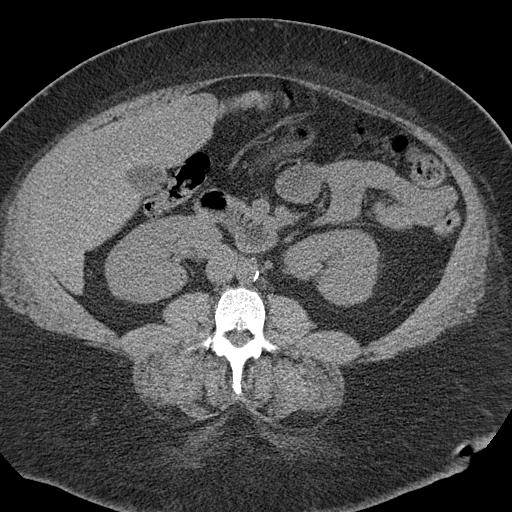
[im 62/93  soft-tissue]
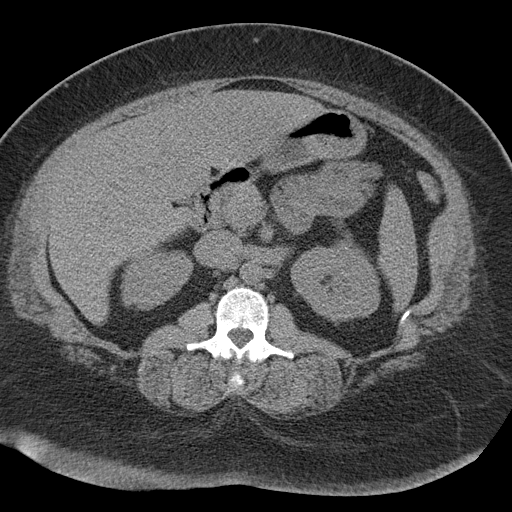
[im 62/93  bone]
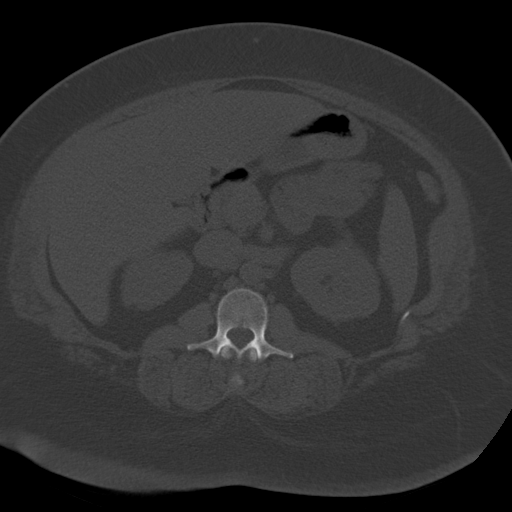
[im 66/93  soft-tissue]
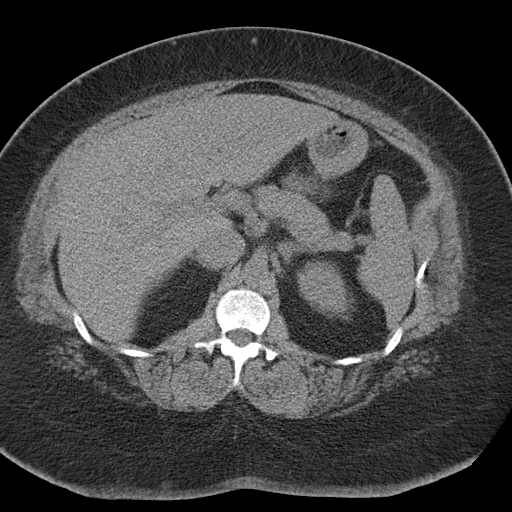
[im 73/93  soft-tissue]
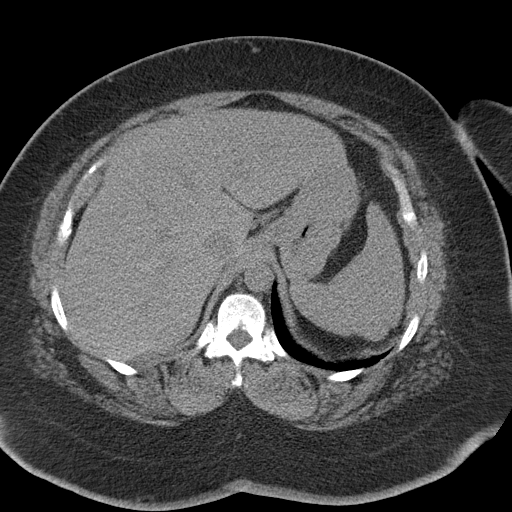
[im 77/93  lung]
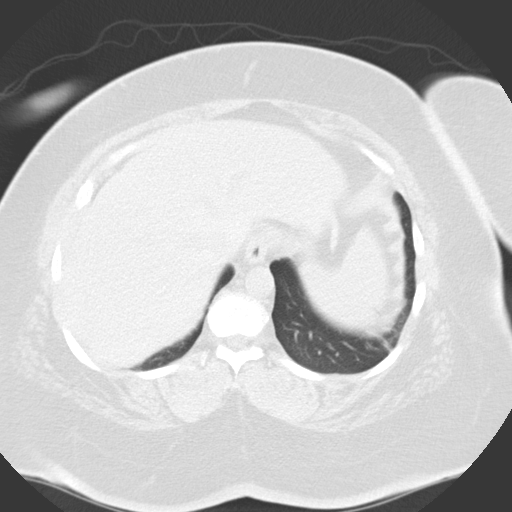
[im 81/93  soft-tissue]
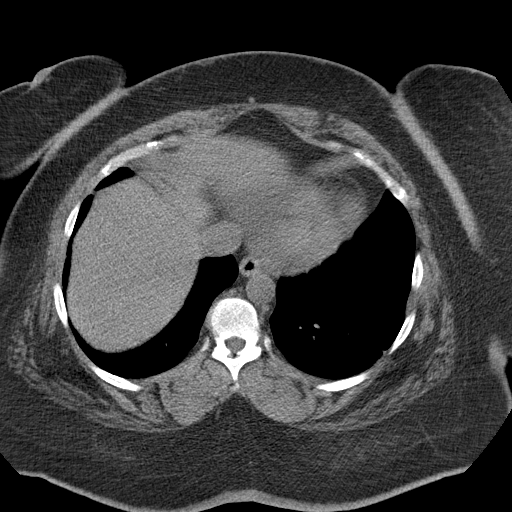
[im 81/93  lung]
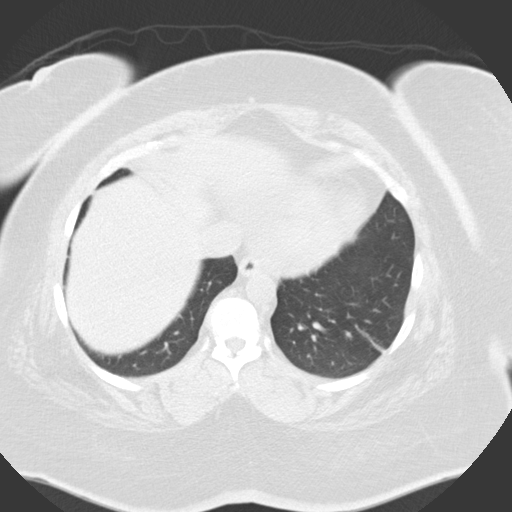
[im 85/93  lung]
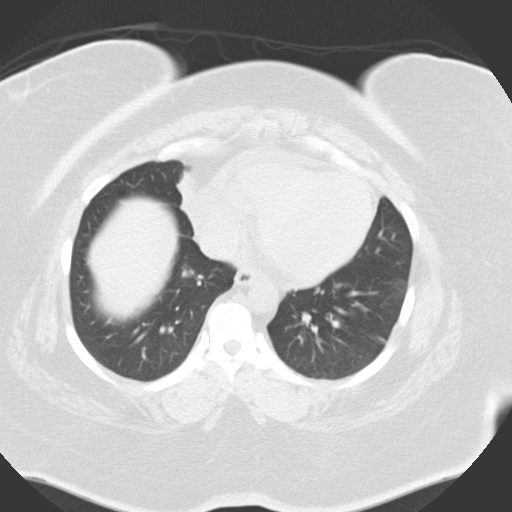
[im 89/93  soft-tissue]
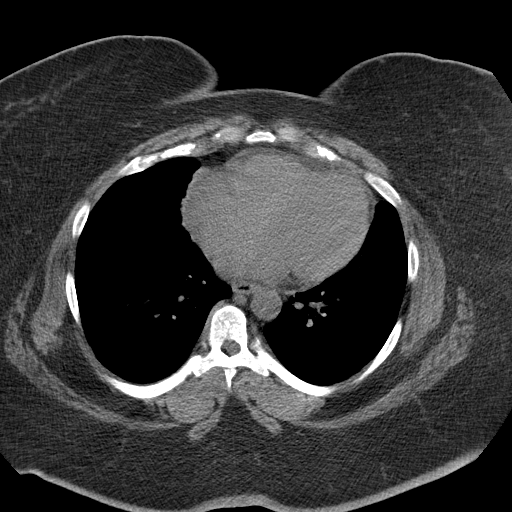
[im 89/93  lung]
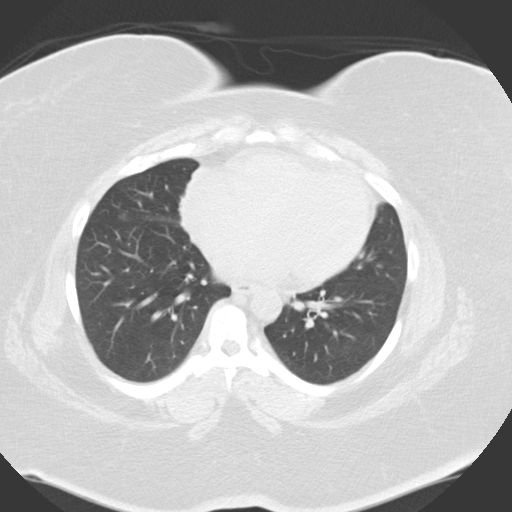

[15 of 32 positions shown; findings below may reference images not displayed]

FINDINGS: Please note that parenchymal abnormalities may be missed without
intravenous contrast.

Lower chest: A small pericardial effusion is noted. Small
ill-defined streaky opacities within the right middle lobe and right
lower lobe may represent infection.

Hepatobiliary: The liver and gallbladder are unremarkable. There is
no evidence of biliary dilatation.

Pancreas: Unremarkable

Spleen: Unremarkable

Adrenals/Urinary Tract: A 1.7 cm lesion extending off of the medial
mid left kidney measures 37 Hounsfield units and nonspecific. The
kidneys, adrenal glands and bladder are otherwise unremarkable.

Stomach/Bowel: There is no evidence of bowel obstruction or definite
bowel wall thickening. The appendix is normal.

Vascular/Lymphatic: No enlarged lymph nodes or abdominal aortic
aneurysm. Mild aortic atherosclerotic calcifications noted.

Reproductive: Uterus and adnexal regions are unremarkable.

Other: Anterior periumbilical skin/subcutaneous thickening is
nonspecific and may represent edema or cellulitis. A small umbilical
hernia containing fat is noted. There is no evidence of free fluid

Musculoskeletal: No acute or suspicious abnormality.
IMPRESSION: Anterior periumbilical skin/subcutaneous thickening which may
represent edema or cellulitis. Correlate clinically. Small umbilical
hernia containing fat.

Nonspecific 1.7 cm left renal lesion. Recommend elective renal
protocol MRI (or CT if MR contraindicated).

Streaky ill-defined opacities within the right middle and lower
lobes-suspect infection/inflammation. Consider CT follow-up in 3
months.

## 2014-10-28 MED ORDER — KETOROLAC TROMETHAMINE 30 MG/ML IJ SOLN
30.0000 mg | Freq: Once | INTRAMUSCULAR | Status: AC
  Administered 2014-10-28: 30 mg via INTRAVENOUS
  Filled 2014-10-28: qty 1

## 2014-10-28 MED ORDER — SULFAMETHOXAZOLE-TRIMETHOPRIM 800-160 MG PO TABS: 1.0000 | ORAL_TABLET | Freq: Two times a day (BID) | ORAL | Status: DC

## 2014-10-28 MED ORDER — DIAZEPAM 5 MG PO TABS: 5.0000 mg | ORAL_TABLET | Freq: Three times a day (TID) | ORAL | Status: DC | PRN

## 2014-10-28 MED ORDER — MUPIROCIN 2 % EX OINT: TOPICAL_OINTMENT | CUTANEOUS | Status: DC

## 2014-10-28 MED ORDER — MORPHINE SULFATE (PF) 4 MG/ML IV SOLN
4.0000 mg | Freq: Once | INTRAVENOUS | Status: AC
  Administered 2014-10-28: 4 mg via INTRAVENOUS
  Filled 2014-10-28: qty 1

## 2014-10-28 MED ORDER — CEFTRIAXONE SODIUM 1 G IJ SOLR
1.0000 g | Freq: Once | INTRAMUSCULAR | Status: AC
  Administered 2014-10-28: 1 g via INTRAVENOUS
  Filled 2014-10-28: qty 10

## 2014-10-28 MED ORDER — IBUPROFEN 800 MG PO TABS: 800.0000 mg | ORAL_TABLET | Freq: Three times a day (TID) | ORAL | Status: DC | PRN

## 2014-10-28 MED ORDER — ONDANSETRON HCL 4 MG/2ML IJ SOLN
4.0000 mg | Freq: Once | INTRAMUSCULAR | Status: AC
  Administered 2014-10-28: 4 mg via INTRAVENOUS
  Filled 2014-10-28: qty 2

## 2014-10-28 MED ORDER — SODIUM CHLORIDE 0.9 % IV SOLN
1000.0000 mL | Freq: Once | INTRAVENOUS | Status: AC
  Administered 2014-10-28: 1000 mL via INTRAVENOUS

## 2014-10-28 NOTE — Discharge Instructions (Signed)
Flank Pain Flank pain refers to pain that is located on the side of the body between the upper abdomen and the back. The pain may occur over a short period of time (acute) or may be long-term or reoccurring (chronic). It may be mild or severe. Flank pain can be caused by many things. CAUSES  Some of the more common causes of flank pain include:  Muscle strains.   Muscle spasms.   A disease of your spine (vertebral disk disease).   A lung infection (pneumonia).   Fluid around your lungs (pulmonary edema).   A kidney infection.   Kidney stones.   A very painful skin rash caused by the chickenpox virus (shingles).   Gallbladder disease.  [place] care will depend on the cause of your pain. In general,  Rest as directed by your caregiver.  Drink enough fluids to keep your urine clear or pale yellow.  Only take over-the-counter or prescription medicines as directed by your caregiver. Some medicines may help relieve the pain.  Tell your caregiver about any changes in your pain.  Follow up with your caregiver as directed. SEEK IMMEDIATE MEDICAL CARE IF:   Your pain is not controlled with medicine.   You have new or worsening symptoms.  Your pain increases.   You have abdominal pain.   You have shortness of breath.   You have persistent nausea or vomiting.   You have swelling in your abdomen.   You feel faint or pass out.   You have blood in your urine.  You have a fever or persistent symptoms for more than 2-3 days.  You have a fever and your symptoms suddenly get worse. MAKE SURE YOU:   Understand these instructions.  Will watch your condition.  Will get help right away if you are not doing well or get worse. Document Released: 04/04/2005 Document Revised: 11/06/2011 Document Reviewed: 09/26/2011 [facility] Patient Information 2015 [place], [place]. This information is not intended to replace advice given to you by your  health care provider. Make sure you discuss any questions you have with your health care provider.  Urinary Tract Infection Urinary tract infections (UTIs) can develop anywhere along your urinary tract. Your urinary tract is your body's drainage system for removing wastes and extra water. Your urinary tract includes two kidneys, two ureters, a bladder, and a urethra. Your kidneys are a pair of bean-shaped organs. Each kidney is about the size of your fist. They are located below your ribs, one on each side of your spine. CAUSES Infections are caused by microbes, which are microscopic organisms, including fungi, viruses, and bacteria. These organisms are so small that they can only be seen through a microscope. Bacteria are the microbes that most commonly cause UTIs. SYMPTOMS  Symptoms of UTIs may vary by age and gender of the patient and by the location of the infection. Symptoms in young women typically include a frequent and intense urge to urinate and a painful, burning feeling in the bladder or urethra during urination. Older women and men are more likely to be tired, shaky, and weak and have muscle aches and abdominal pain. A fever may mean the infection is in your kidneys. Other symptoms of a kidney infection include pain in your back or sides below the ribs, nausea, and vomiting. DIAGNOSIS To diagnose a UTI, your caregiver will ask you about your symptoms. Your caregiver also will ask to provide a urine sample. The urine sample will be tested for  bacteria and white blood cells. White blood cells are made by your body to help fight infection. TREATMENT  Typically, UTIs can be treated with medication. Because most UTIs are caused by a bacterial infection, they usually can be treated with the use of antibiotics. The choice of antibiotic and length of treatment depend on your symptoms and the type of bacteria causing your infection. HOME CARE INSTRUCTIONS  If you were prescribed antibiotics, take  them exactly as your caregiver instructs you. Finish the medication even if you feel better after you have only taken some of the medication.  Drink enough water and fluids to keep your urine clear or pale yellow.  Avoid caffeine, tea, and carbonated beverages. They tend to irritate your bladder.  Empty your bladder often. Avoid holding urine for long periods of time.  Empty your bladder before and after sexual intercourse.  After a bowel movement, women should cleanse from front to back. Use each tissue only once. SEEK MEDICAL CARE IF:   You have back pain.  You develop a fever.  Your symptoms do not begin to resolve within 3 days. SEEK IMMEDIATE MEDICAL CARE IF:   You have severe back pain or lower abdominal pain.  You develop chills.  You have nausea or vomiting.  You have continued burning or discomfort with urination. MAKE SURE YOU:   Understand these instructions.  Will watch your condition.  Will get help right away if you are not doing well or get worse. Document Released: 11/21/2004 Document Revised: 08/13/2011 Document Reviewed: 03/22/2011 [facility] Patient Information 2015 [place], [place]. This information is not intended to replace advice given to you by your health care provider. Make sure you discuss any questions you have with your health care provider.

## 2014-10-28 NOTE — ED Provider Notes (Signed)
[facility] Emergency Department Provider Note     Time seen: ----------------------------------------- 5:57 PM on 10/28/2014 -----------------------------------------    I have reviewed the triage vital signs and the nursing notes.   HISTORY  Chief Complaint Abdominal Pain    HPI [name] is a 44 y.o. female who presents ER for weakness, right-sided abdominal pain. Patient states her blood sugars have been in the 200s, she has a history of chronic anemia, fevers or chills. Patient states movement makes the right side pain worse. Pain is sharp and stabbing   Past Medical History  Diagnosis Date  . Diabetes mellitus without complication   . Anemia   . Asthma   . Hypertension     There are no active problems to display for this patient.   Past Surgical History  Procedure Laterality Date  . Cesarean section      Allergies Review of patient's allergies indicates no known allergies.  Social History Social History  Substance Use Topics  . Smoking status: Never Smoker   . Smokeless tobacco: None  . Alcohol Use: Yes    Review of Systems Constitutional: Negative for fever. Eyes: Negative for visual changes. ENT: Negative for sore throat. Cardiovascular: Negative for chest pain. Respiratory: Negative for shortness of breath. Gastrointestinal: Positive for abdominal pain and vomiting Genitourinary: Negative for dysuria. Musculoskeletal: Negative for back pain. Skin: Positive for skin lesions and rash Neurological: Negative for headaches, focal weakness or numbness.  10-point ROS otherwise negative.  ____________________________________________   PHYSICAL EXAM:  VITAL SIGNS: ED Triage Vitals  Enc Vitals Group     BP 10/28/14 1714 159/80 mmHg     Pulse Rate 10/28/14 1714 95     Resp 10/28/14 1714 18     Temp 10/28/14 1714 98.7 F (37.1 C)     Temp Source 10/28/14 1714 Oral     SpO2 10/28/14 1714 99 %     Weight 10/28/14  1714 320 lb (145.151 kg)     Height 10/28/14 1714 5\' 4"  (1.626 m)     Head Cir --      Peak Flow --      Pain Score 10/28/14 1714 8     Pain Loc --      Pain Edu? --      Excl. in Derma? --     Constitutional: Alert and oriented. Well appearing and in no distress. Morbidly obese Eyes: Conjunctivae are normal. PERRL. Normal extraocular movements. ENT   Head: Normocephalic and atraumatic.   Nose: No congestion/rhinnorhea.   Mouth/Throat: Mucous membranes are moist.   Neck: No stridor. Cardiovascular: Normal rate, regular rhythm. Normal and symmetric distal pulses are present in all extremities. No murmurs, rubs, or gallops. Respiratory: Normal respiratory effort without tachypnea nor retractions. Breath sounds are clear and equal bilaterally. No wheezes/rales/rhonchi. Gastrointestinal: Soft and nontender. No distention. No abdominal bruits.  Musculoskeletal: Nontender with normal range of motion in all extremities. No joint effusions.  No lower extremity tenderness nor edema. Neurologic:  Normal speech and language. No gross focal neurologic deficits are appreciated. Speech is normal. No gait instability. Skin:  Skin is warm, dry and intact. No rash noted. Psychiatric: Mood and affect are normal. Speech and behavior are normal. Patient exhibits appropriate insight and judgment.  ____________________________________________  ED COURSE:  Pertinent labs & imaging results that were available during my care of the patient were reviewed by me and considered in my medical decision making (see chart for details). Patient with abdominal pain  over the last month. Also feeling weak, will need basic labs and renal protocol CT ____________________________________________    LABS (pertinent positives/negatives)  Labs Reviewed  CBC WITH DIFFERENTIAL/PLATELET - Abnormal; Notable for the following:    Hemoglobin 9.8 (*)    HCT 31.8 (*)    MCV 72.6 (*)    MCH 22.3 (*)    MCHC 30.7 (*)     RDW 15.7 (*)    Platelets 444 (*)    All other components within normal limits  COMPREHENSIVE METABOLIC PANEL - Abnormal; Notable for the following:    Potassium 3.4 (*)    Chloride 96 (*)    Glucose, Bld 192 (*)    BUN 21 (*)    Creatinine, Ser 1.42 (*)    Calcium 8.4 (*)    Total Protein 6.1 (*)    Albumin 2.6 (*)    Total Bilirubin <0.1 (*)    GFR calc non Af Amer 44 (*)    GFR calc Af Amer 52 (*)    All other components within normal limits  URINALYSIS COMPLETEWITH MICROSCOPIC ([facility] ONLY) - Abnormal; Notable for the following:    Color, Urine YELLOW (*)    APPearance HAZY (*)    Glucose, UA >500 (*)    Hgb urine dipstick 1+ (*)    Protein, ur >500 (*)    Bacteria, UA RARE (*)    Squamous Epithelial / LPF 6-30 (*)    All other components within normal limits  LIPASE, BLOOD    RADIOLOGY Images were viewed by me  CT renal protocol IMPRESSION: Anterior periumbilical skin/subcutaneous thickening which may represent edema or cellulitis. Correlate clinically. Small umbilical hernia containing fat.  Nonspecific 1.7 cm left renal lesion. Recommend elective renal protocol MRI (or CT if MR contraindicated).  Streaky ill-defined opacities within the right middle and lower lobes-suspect infection/inflammation. Consider CT follow-up in 3 months. ____________________________________________  FINAL ASSESSMENT AND PLAN  Flank pain, weakness, cystitis  Plan: Patient with labs and imaging as dictated above. Patient will be discharged with antibiotics, NSAIDs and Valium. She is stable for outpatient follow-up with her doctor for reevaluation.   Earleen Newport, MD   Earleen Newport, MD 10/28/14 [phone]

## 2014-10-28 NOTE — ED Notes (Signed)
Patient to ED with report of feeling groggy over the last few days with cramping to RUQ.

## 2014-10-29 LAB — POCT PREGNANCY, URINE: Preg Test, Ur: NEGATIVE

## 2014-11-20 ENCOUNTER — Emergency Department
Admission: EM | Admit: 2014-11-20 | Discharge: 2014-11-20 | Disposition: A | Discharge status: Home / Self Care | Payer: Medicaid Other | Attending: Emergency Medicine | Admitting: Emergency Medicine

## 2014-11-20 ENCOUNTER — Emergency Department: Payer: Medicaid Other

## 2014-11-20 DIAGNOSIS — E119 Type 2 diabetes mellitus without complications: Secondary | ICD-10-CM | POA: Diagnosis not present

## 2014-11-20 DIAGNOSIS — Z79899 Other long term (current) drug therapy: Secondary | ICD-10-CM | POA: Insufficient documentation

## 2014-11-20 DIAGNOSIS — I10 Essential (primary) hypertension: Secondary | ICD-10-CM | POA: Diagnosis not present

## 2014-11-20 DIAGNOSIS — J45901 Unspecified asthma with (acute) exacerbation: Secondary | ICD-10-CM | POA: Insufficient documentation

## 2014-11-20 DIAGNOSIS — J209 Acute bronchitis, unspecified: Secondary | ICD-10-CM

## 2014-11-20 DIAGNOSIS — J45909 Unspecified asthma, uncomplicated: Secondary | ICD-10-CM

## 2014-11-20 DIAGNOSIS — R05 Cough: Secondary | ICD-10-CM | POA: Diagnosis present

## 2014-11-20 IMAGING — CR DG CHEST 2V
1 series · 2 of 2 positions shown · non-contrast
Comparison: PA and lateral chest [DATE] and [DATE].

CLINICAL DATA: Cough, body aches and chills today. History of
asthma.

EXAM:
CHEST  2 VIEW

[Series 1: dg chest 2 view · 0.14mm/px · 2 of 2 slices shown]
[im 1/2]
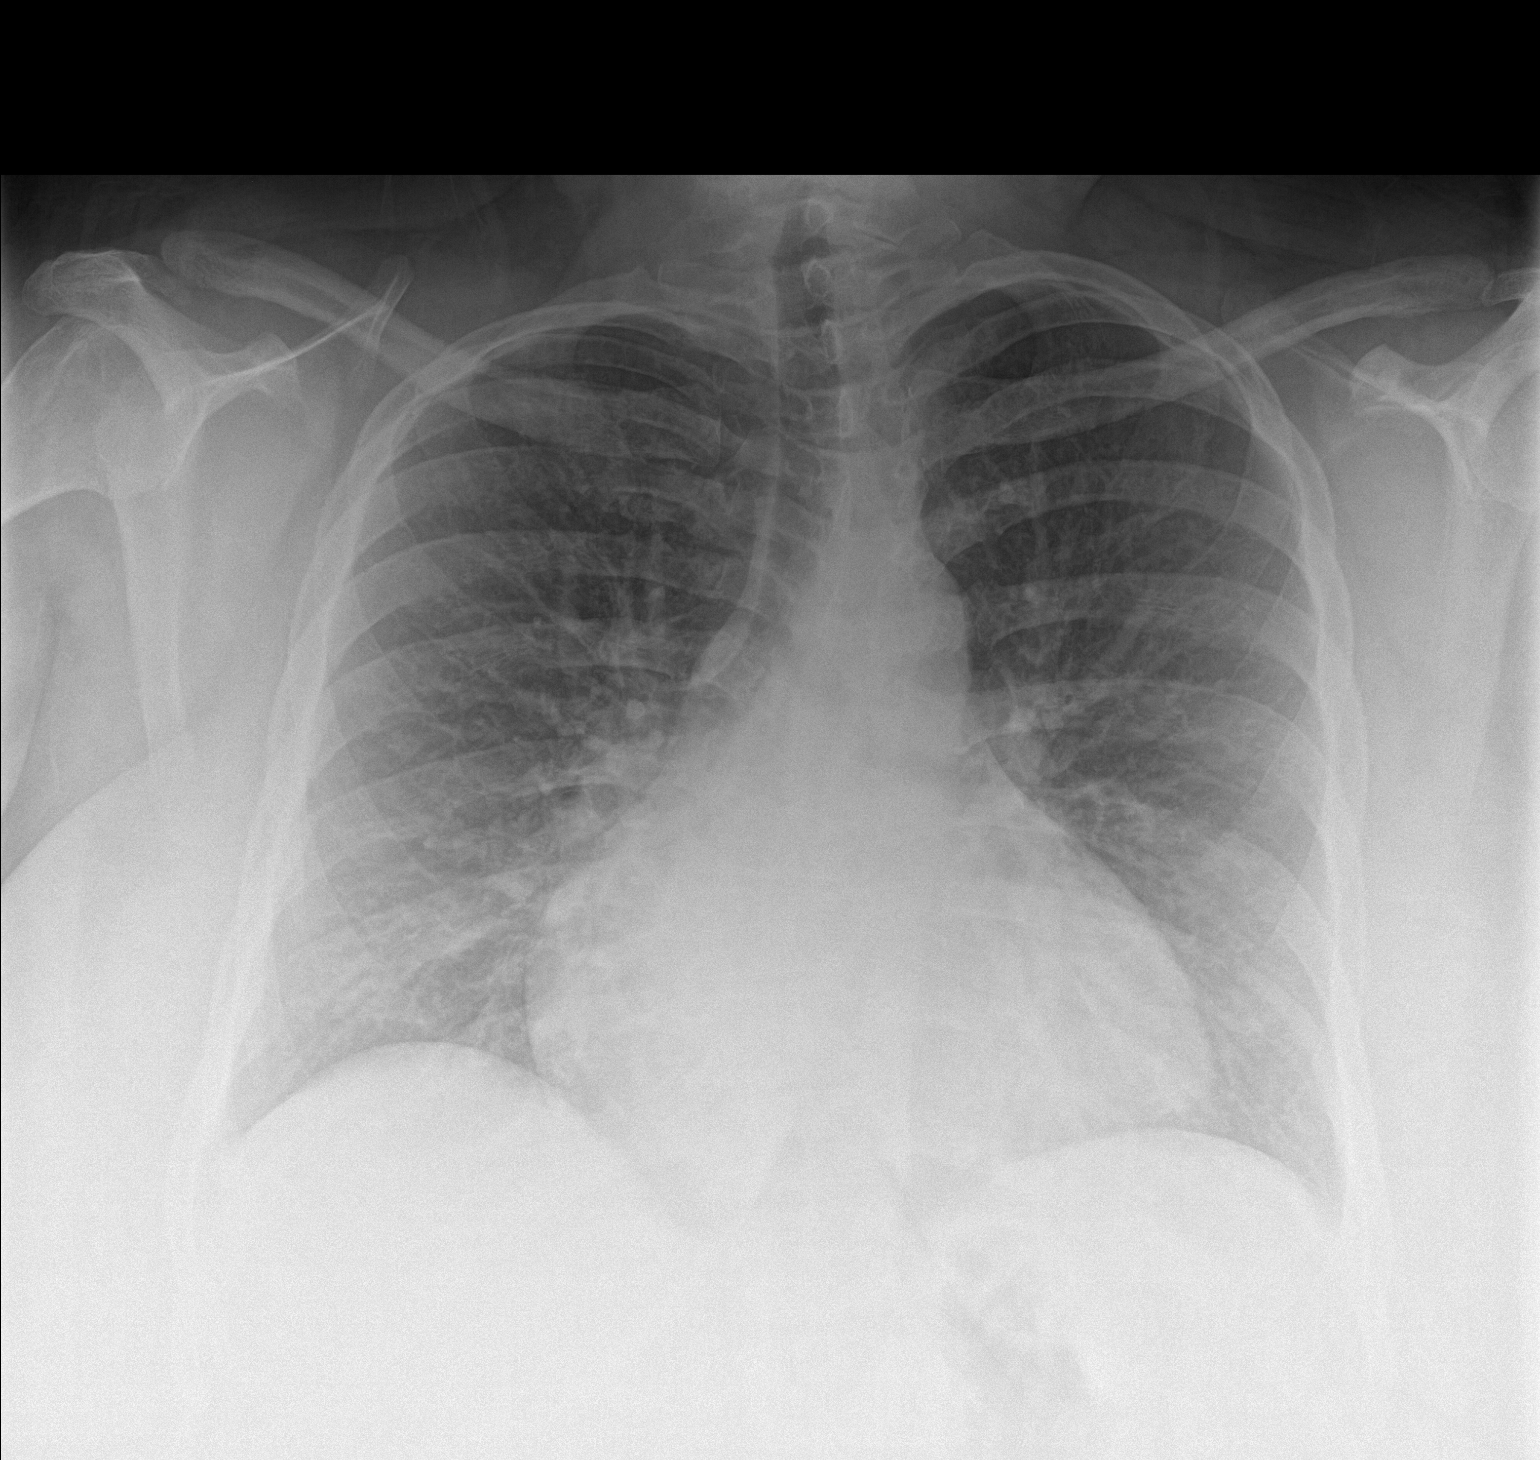
[im 2/2]
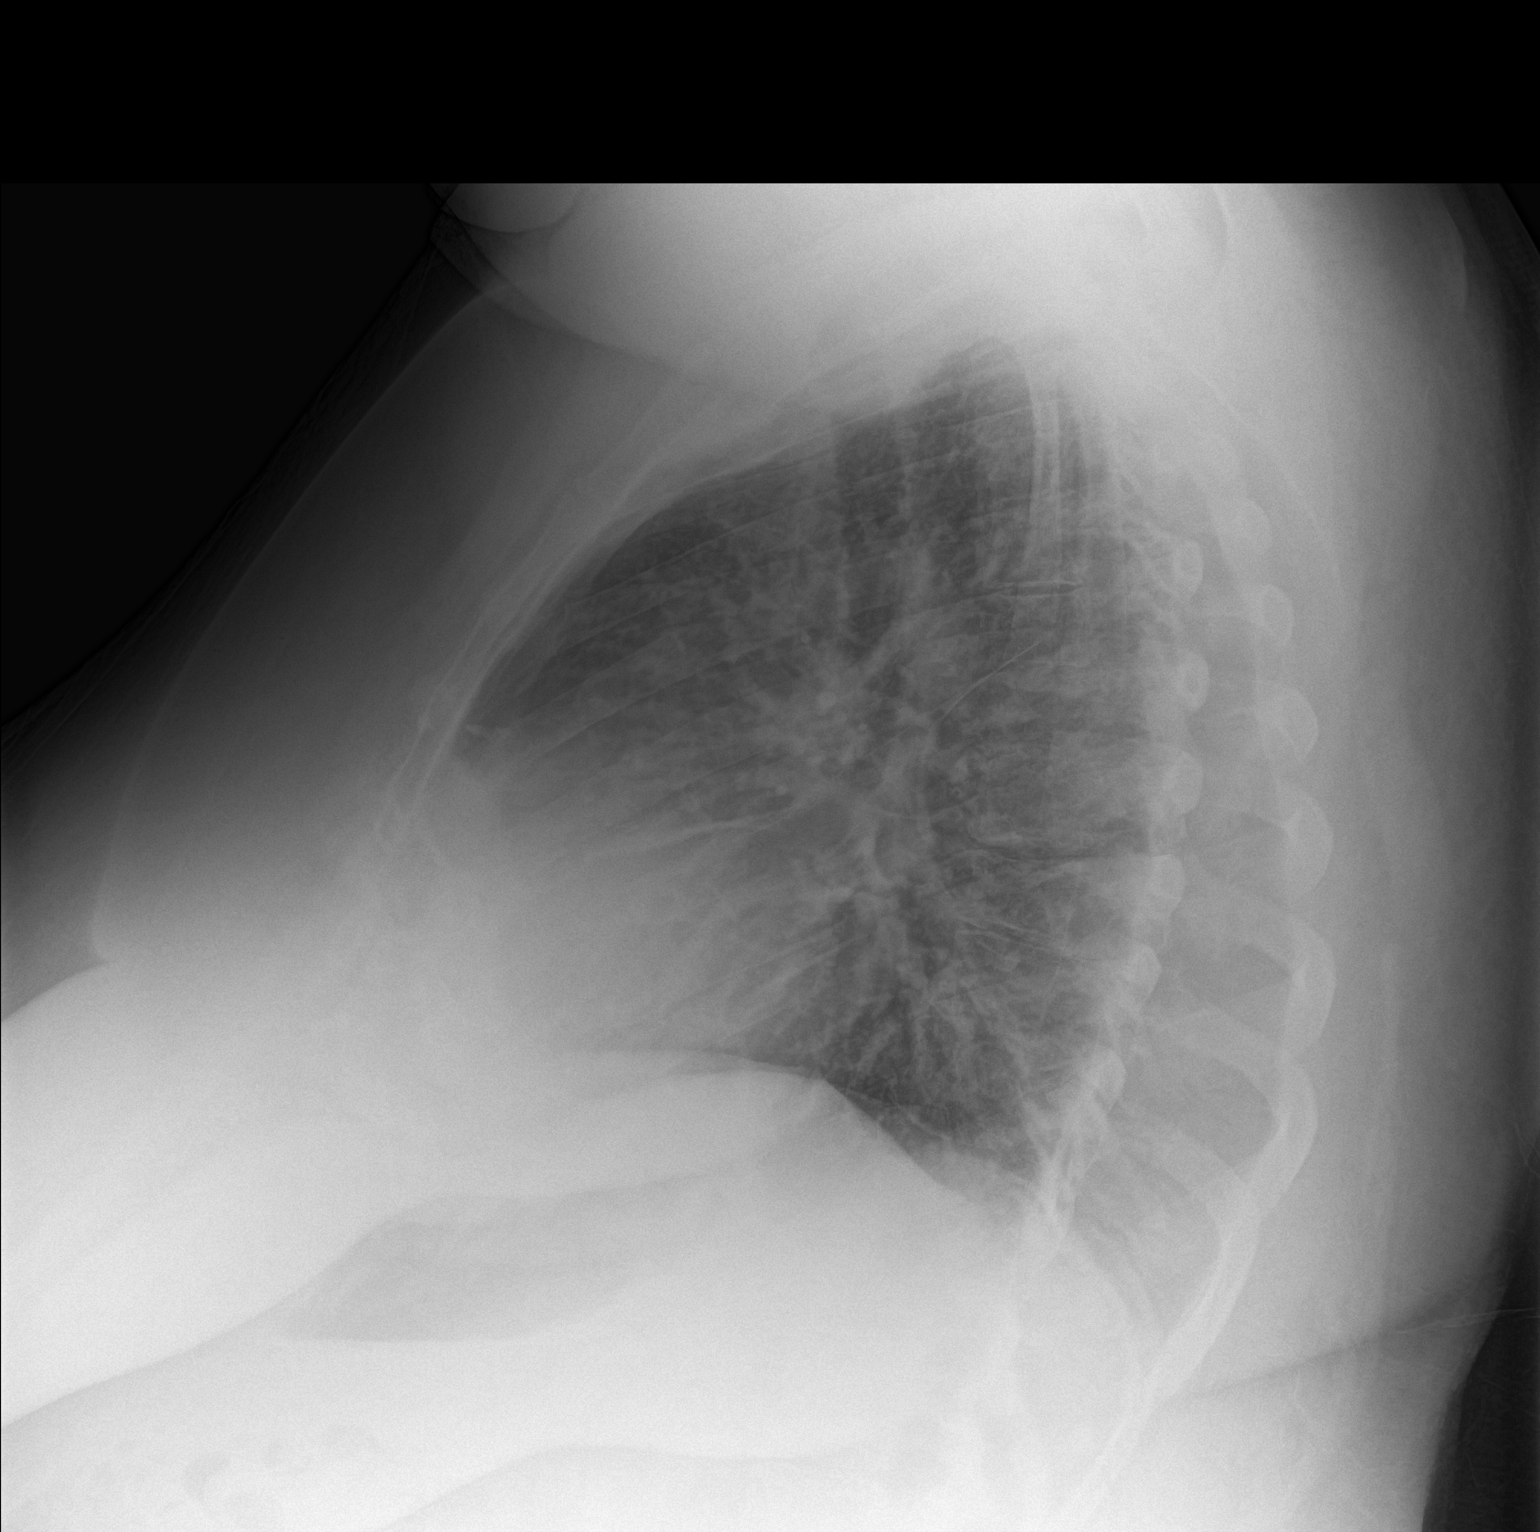

[2 of 2 positions shown; findings below may reference images not displayed]

FINDINGS: There is cardiomegaly without edema. No pneumothorax or pleural
effusion. No focal bony abnormality.
IMPRESSION: Cardiomegaly without acute disease.

## 2014-11-20 MED ORDER — PREDNISONE 10 MG (21) PO TBPK: 10.0000 mg | ORAL_TABLET | Freq: Every day | ORAL | Status: DC

## 2014-11-20 MED ORDER — AZITHROMYCIN 250 MG PO TABS: ORAL_TABLET | ORAL | Status: DC

## 2014-11-20 MED ORDER — IPRATROPIUM-ALBUTEROL 0.5-2.5 (3) MG/3ML IN SOLN
3.0000 mL | Freq: Once | RESPIRATORY_TRACT | Status: AC
  Administered 2014-11-20: 3 mL via RESPIRATORY_TRACT
  Filled 2014-11-20: qty 3

## 2014-11-20 NOTE — ED Provider Notes (Signed)
[facility] Emergency Department Provider Note ____________________________________________  Time seen: 1635  I have reviewed the triage vital signs and the nursing notes.  HISTORY  Chief Complaint  Asthma; Cough; and Generalized Body Aches  HPI [name] is a 44 y.o. female reports to the ED for increasing shortness of breath over the last 2 weeks. She reports a cough for the last week and a half, with intermittent rib and back pain. She also notes some mild congestion and some low-grade fevers over the last few days. She is a chronic asthma supple, and is currently without prescription coverage. She's been dosing her albuterol regularly without significant, or sustaining benefit.   Past Medical History  Diagnosis Date  . Diabetes mellitus without complication   . Anemia   . Asthma   . Hypertension    There are no active problems to display for this patient.  Past Surgical History  Procedure Laterality Date  . Cesarean section      Current Outpatient Rx  Name  Route  Sig  Dispense  Refill  . azithromycin (ZITHROMAX Z-PAK) 250 MG tablet      Take 2 tablets (500 mg) on  Day 1,  followed by 1 tablet (250 mg) once daily on Days 2 through 5.   6 each   0   . diazepam (VALIUM) 5 MG tablet   Oral   Take 1 tablet (5 mg total) by mouth every 8 (eight) hours as needed for muscle spasms.   20 tablet   0   . ibuprofen (ADVIL,MOTRIN) 800 MG tablet   Oral   Take 1 tablet (800 mg total) by mouth every 8 (eight) hours as needed.   30 tablet   0   . mupirocin ointment (BACTROBAN) 2 %      Apply to affected area 3 times daily   22 g   0   . predniSONE (STERAPRED UNI-PAK 21 TAB) 10 MG (21) TBPK tablet   Oral   Take 1 tablet (10 mg total) by mouth daily.   21 tablet   0   . sulfamethoxazole-trimethoprim (BACTRIM DS) 800-160 MG per tablet   Oral   Take 1 tablet by mouth 2 (two) times daily.   20 tablet   0    Allergies Review of patient's  allergies indicates no known allergies.  No family history on file.  Social History Social History  Substance Use Topics  . Smoking status: Never Smoker   . Smokeless tobacco: Not on file  . Alcohol Use: Yes   Review of Systems  Constitutional: Negative for fever. Eyes: Negative for visual changes. ENT: Negative for sore throat. Cardiovascular: Negative for chest pain. Respiratory: Positive for shortness of breath. Gastrointestinal: Negative for abdominal pain, vomiting and diarrhea. Genitourinary: Negative for dysuria. Musculoskeletal: Negative for back pain. Skin: Negative for rash. Neurological: Negative for headaches, focal weakness or numbness. ____________________________________________  PHYSICAL EXAM:  VITAL SIGNS: ED Triage Vitals  Enc Vitals Group     BP 11/20/14 1418 175/65 mmHg     Pulse Rate 11/20/14 1416 98     Resp 11/20/14 1416 20     Temp 11/20/14 1416 98.7 F (37.1 C)     Temp Source 11/20/14 1416 Oral     SpO2 11/20/14 1416 96 %     Weight 11/20/14 1416 320 lb (145.151 kg)     Height 11/20/14 1416 5\' 4"  (1.626 m)     Head Cir --  Peak Flow --      Pain Score 11/20/14 1417 8     Pain Loc --      Pain Edu? --      Excl. in [place]? --    Constitutional: Alert and oriented. Well appearing and in no distress. Eyes: Conjunctivae are normal. PERRL. Normal extraocular movements. ENT   Head: Normocephalic and atraumatic.   Nose: No congestion/rhinorrhea.   Mouth/Throat: Mucous membranes are moist.   Neck: Supple. No thyromegaly. Hematological/Lymphatic/Immunological: No cervical lymphadenopathy. Cardiovascular: Normal rate, regular rhythm.  Respiratory: Normal respiratory effort. No wheezes/rales/rhonchi. Gastrointestinal: Soft and nontender. No distention. Musculoskeletal: Nontender with normal range of motion in all extremities.  Neurologic:  Normal gait without ataxia. Normal speech and language. No gross focal neurologic deficits are  appreciated. Skin:  Skin is warm, dry and intact. No rash noted. Psychiatric: Mood and affect are normal. Patient exhibits appropriate insight and judgment. ____________________________________________   RADIOLOGY CXR IMPRESSION: Cardiomegaly without acute disease.  I, Menshew, Dannielle Karvonen, personally viewed and evaluated these images (plain radiographs) as part of my medical decision making.  ____________________________________________  PROCEDURES  DuoNeb x 1 ____________________________________________  INITIAL IMPRESSION / ASSESSMENT AND PLAN / ED COURSE  Patient reports some improvement with the DuoNeb. She is encouraged to restart her home medications including Zyrtec and Singulair. She will follow up with her primary care provider at [facility] clinic for ongoing symptoms. She'll be prescribed a Z-Pak and a prednisone taper pack. ____________________________________________  FINAL CLINICAL IMPRESSION(S) / ED DIAGNOSES  Final diagnoses:  Bronchitis with asthma, acute     Melvenia Needles, PA-C 11/20/14 1833  Eula Listen, MD 12/02/14 [phone]

## 2014-11-20 NOTE — ED Notes (Signed)
Pt to triage with reports that she has been having issues with her asthma, states that she has a cough with body aches and chills. Also reports back pain.

## 2014-11-20 NOTE — ED Notes (Signed)
Pt c/o SOB increasing x2 weeks. Started on abx 2 weeks ago for UTI. States very SOB with exertion. + cough. C/o rib pain due to cough per report.

## 2014-11-20 NOTE — Discharge Instructions (Signed)
Asthma, Acute Bronchospasm °Acute bronchospasm caused by asthma is also referred to as an asthma attack. Bronchospasm means your air passages become narrowed. The narrowing is caused by inflammation and tightening of the muscles in the air tubes (bronchi) in your lungs. This can make it hard to breathe or cause you to wheeze and cough. °CAUSES °Possible triggers are: °· Animal dander from the skin, hair, or feathers of animals. °· Dust mites contained in house dust. °· Cockroaches. °· Pollen from trees or grass. °· Mold. °· Cigarette or tobacco smoke. °· Air pollutants such as dust, household cleaners, hair sprays, aerosol sprays, paint fumes, strong chemicals, or strong odors. °· Cold air or weather changes. Cold air may trigger inflammation. Winds increase molds and pollens in the air. °· Strong emotions such as crying or laughing hard. °· Stress. °· Certain medicines such as aspirin or beta-blockers. °· Sulfites in foods and drinks, such as dried fruits and wine. °· Infections or inflammatory conditions, such as a flu, cold, or inflammation of the nasal membranes (rhinitis). °· Gastroesophageal reflux disease (GERD). GERD is a condition where stomach acid backs up into your esophagus. °· Exercise or strenuous activity. °SIGNS AND SYMPTOMS  °· Wheezing. °· Excessive coughing, particularly at night. °· Chest tightness. °· Shortness of breath. °DIAGNOSIS  °Your health care provider will ask you about your medical history and perform a physical exam. A chest X-ray or blood testing may be performed to look for other causes of your symptoms or other conditions that may have triggered your asthma attack.  °TREATMENT  °Treatment is aimed at reducing inflammation and opening up the airways in your lungs.  Most asthma attacks are treated with inhaled medicines. These include quick relief or rescue medicines (such as bronchodilators) and controller medicines (such as inhaled corticosteroids). These medicines are sometimes  given through an inhaler or a nebulizer. Systemic steroid medicine taken by mouth or given through an IV tube also can be used to reduce the inflammation when an attack is moderate or severe. Antibiotic medicines are only used if a bacterial infection is present.  °HOME CARE INSTRUCTIONS  °· Rest. °· Drink plenty of liquids. This helps the mucus to remain thin and be easily coughed up. Only use caffeine in moderation and do not use alcohol until you have recovered from your illness. °· Do not smoke. Avoid being exposed to secondhand smoke. °· You play a critical role in keeping yourself in good health. Avoid exposure to things that cause you to wheeze or to have breathing problems. °· Keep your medicines up-to-date and available. Carefully follow your health care provider's treatment plan. °· Take your medicine exactly as prescribed. °· When pollen or pollution is bad, keep windows closed and use an air conditioner or go to places with air conditioning. °· Asthma requires careful medical care. See your health care provider for a follow-up as advised. If you are more than [redacted] weeks pregnant and you were prescribed any new medicines, let your obstetrician know about the visit and how you are doing. Follow up with your health care provider as directed. °· After you have recovered from your asthma attack, make an appointment with your outpatient doctor to talk about ways to reduce the likelihood of future attacks. If you do not have a doctor who manages your asthma, make an appointment with a primary care doctor to discuss your asthma. °SEEK IMMEDIATE MEDICAL CARE IF:  °· You are getting worse. °· You have trouble breathing. If severe, call your local   emergency services (911 in the [place]  You develop chest pain or discomfort.  You are vomiting.  You are not able to keep fluids down.  You are coughing up yellow, green, brown, or bloody sputum.  You have a fever and your symptoms suddenly get worse.  You have  trouble swallowing. MAKE SURE YOU:   Understand these instructions.  Will watch your condition.  Will get help right away if you are not doing well or get worse. Document Released: 05/29/2006 Document Revised: 02/16/2013 Document Reviewed: 08/19/2012 [facility] Patient Information 2015 [place], [place]. This information is not intended to replace advice given to you by your health care provider. Make sure you discuss any questions you have with your health care provider.  Acute Bronchitis Bronchitis is when the airways that extend from the windpipe into the lungs get red, puffy, and painful (inflamed). Bronchitis often causes thick spit (mucus) to develop. This leads to a cough. A cough is the most common symptom of bronchitis. In acute bronchitis, the condition usually begins suddenly and goes away over time (usually in 2 weeks). Smoking, allergies, and asthma can make bronchitis worse. Repeated episodes of bronchitis may cause more lung problems. HOME CARE  Rest.  Drink enough fluids to keep your pee (urine) clear or pale yellow (unless you need to limit fluids as told by your doctor).  Only take over-the-counter or prescription medicines as told by your doctor.  Avoid smoking and secondhand smoke. These can make bronchitis worse. If you are a smoker, think about using nicotine gum or skin patches. Quitting smoking will help your lungs heal faster.  Reduce the chance of getting bronchitis again by:  Washing your hands often.  Avoiding people with cold symptoms.  Trying not to touch your hands to your mouth, nose, or eyes.  Follow up with your doctor as told. GET HELP IF: Your symptoms do not improve after 1 week of treatment. Symptoms include:  Cough.  Fever.  Coughing up thick spit.  Body aches.  Chest congestion.  Chills.  Shortness of breath.  Sore throat. GET HELP RIGHT AWAY IF:   You have an increased fever.  You have chills.  You have severe shortness of  breath.  You have bloody thick spit (sputum).  You throw up (vomit) often.  You lose too much body fluid (dehydration).  You have a severe headache.  You faint. MAKE SURE YOU:   Understand these instructions.  Will watch your condition.  Will get help right away if you are not doing well or get worse. Document Released: 07/31/2007 Document Revised: 10/14/2012 Document Reviewed: 08/04/2012 [facility] Patient Information 2015 [place], [place]. This information is not intended to replace advice given to you by your health care provider. Make sure you discuss any questions you have with your health care provider.  Take the prescription meds as directed.  Restart your home meds as discussed.  Follow-up with [facility] as needed.

## 2014-12-15 ENCOUNTER — Emergency Department: Payer: Medicaid Other

## 2014-12-15 ENCOUNTER — Observation Stay (HOSPITAL_BASED_OUTPATIENT_CLINIC_OR_DEPARTMENT_OTHER)
Admit: 2014-12-15 | Discharge: 2014-12-15 | Disposition: A | Discharge status: Home / Self Care | Payer: Medicaid Other | Attending: Internal Medicine | Admitting: Internal Medicine

## 2014-12-15 ENCOUNTER — Observation Stay
Admission: EM | Admit: 2014-12-15 | Discharge: 2014-12-19 | Disposition: A | Discharge status: Home / Self Care | Payer: Medicaid Other | Attending: Internal Medicine | Admitting: Internal Medicine

## 2014-12-15 ENCOUNTER — Encounter: Payer: Self-pay | Admitting: Emergency Medicine

## 2014-12-15 ENCOUNTER — Other Ambulatory Visit: Payer: Self-pay

## 2014-12-15 DIAGNOSIS — J90 Pleural effusion, not elsewhere classified: Secondary | ICD-10-CM | POA: Diagnosis not present

## 2014-12-15 DIAGNOSIS — I313 Pericardial effusion (noninflammatory): Secondary | ICD-10-CM

## 2014-12-15 DIAGNOSIS — M7989 Other specified soft tissue disorders: Secondary | ICD-10-CM | POA: Insufficient documentation

## 2014-12-15 DIAGNOSIS — J45909 Unspecified asthma, uncomplicated: Secondary | ICD-10-CM | POA: Insufficient documentation

## 2014-12-15 DIAGNOSIS — I5031 Acute diastolic (congestive) heart failure: Secondary | ICD-10-CM | POA: Diagnosis not present

## 2014-12-15 DIAGNOSIS — Z882 Allergy status to sulfonamides status: Secondary | ICD-10-CM | POA: Insufficient documentation

## 2014-12-15 DIAGNOSIS — R778 Other specified abnormalities of plasma proteins: Secondary | ICD-10-CM

## 2014-12-15 DIAGNOSIS — R0602 Shortness of breath: Secondary | ICD-10-CM | POA: Diagnosis not present

## 2014-12-15 DIAGNOSIS — I5030 Unspecified diastolic (congestive) heart failure: Secondary | ICD-10-CM

## 2014-12-15 DIAGNOSIS — I3139 Other pericardial effusion (noninflammatory): Secondary | ICD-10-CM | POA: Diagnosis present

## 2014-12-15 DIAGNOSIS — E119 Type 2 diabetes mellitus without complications: Secondary | ICD-10-CM

## 2014-12-15 DIAGNOSIS — Z833 Family history of diabetes mellitus: Secondary | ICD-10-CM | POA: Insufficient documentation

## 2014-12-15 DIAGNOSIS — R0981 Nasal congestion: Secondary | ICD-10-CM | POA: Diagnosis not present

## 2014-12-15 DIAGNOSIS — Z79899 Other long term (current) drug therapy: Secondary | ICD-10-CM | POA: Insufficient documentation

## 2014-12-15 DIAGNOSIS — Z8249 Family history of ischemic heart disease and other diseases of the circulatory system: Secondary | ICD-10-CM | POA: Insufficient documentation

## 2014-12-15 DIAGNOSIS — D509 Iron deficiency anemia, unspecified: Secondary | ICD-10-CM | POA: Insufficient documentation

## 2014-12-15 DIAGNOSIS — Z7982 Long term (current) use of aspirin: Secondary | ICD-10-CM | POA: Diagnosis not present

## 2014-12-15 DIAGNOSIS — J40 Bronchitis, not specified as acute or chronic: Secondary | ICD-10-CM | POA: Insufficient documentation

## 2014-12-15 DIAGNOSIS — R05 Cough: Secondary | ICD-10-CM | POA: Insufficient documentation

## 2014-12-15 DIAGNOSIS — R748 Abnormal levels of other serum enzymes: Secondary | ICD-10-CM | POA: Insufficient documentation

## 2014-12-15 DIAGNOSIS — Z6841 Body Mass Index (BMI) 40.0 and over, adult: Secondary | ICD-10-CM | POA: Diagnosis not present

## 2014-12-15 DIAGNOSIS — I517 Cardiomegaly: Secondary | ICD-10-CM | POA: Insufficient documentation

## 2014-12-15 DIAGNOSIS — Z794 Long term (current) use of insulin: Secondary | ICD-10-CM | POA: Insufficient documentation

## 2014-12-15 DIAGNOSIS — Z7951 Long term (current) use of inhaled steroids: Secondary | ICD-10-CM | POA: Insufficient documentation

## 2014-12-15 DIAGNOSIS — G4730 Sleep apnea, unspecified: Secondary | ICD-10-CM | POA: Diagnosis not present

## 2014-12-15 DIAGNOSIS — R0600 Dyspnea, unspecified: Secondary | ICD-10-CM | POA: Diagnosis present

## 2014-12-15 DIAGNOSIS — R9431 Abnormal electrocardiogram [ECG] [EKG]: Secondary | ICD-10-CM | POA: Insufficient documentation

## 2014-12-15 DIAGNOSIS — E1165 Type 2 diabetes mellitus with hyperglycemia: Secondary | ICD-10-CM | POA: Insufficient documentation

## 2014-12-15 DIAGNOSIS — E1122 Type 2 diabetes mellitus with diabetic chronic kidney disease: Secondary | ICD-10-CM

## 2014-12-15 DIAGNOSIS — I4581 Long QT syndrome: Secondary | ICD-10-CM | POA: Insufficient documentation

## 2014-12-15 DIAGNOSIS — R7989 Other specified abnormal findings of blood chemistry: Secondary | ICD-10-CM | POA: Diagnosis present

## 2014-12-15 DIAGNOSIS — I319 Disease of pericardium, unspecified: Secondary | ICD-10-CM | POA: Diagnosis present

## 2014-12-15 DIAGNOSIS — Z823 Family history of stroke: Secondary | ICD-10-CM | POA: Diagnosis not present

## 2014-12-15 DIAGNOSIS — I10 Essential (primary) hypertension: Secondary | ICD-10-CM

## 2014-12-15 DIAGNOSIS — N186 End stage renal disease: Secondary | ICD-10-CM

## 2014-12-15 DIAGNOSIS — Z9119 Patient's noncompliance with other medical treatment and regimen: Secondary | ICD-10-CM | POA: Diagnosis not present

## 2014-12-15 DIAGNOSIS — I110 Hypertensive heart disease with heart failure: Principal | ICD-10-CM | POA: Diagnosis present

## 2014-12-15 DIAGNOSIS — D649 Anemia, unspecified: Secondary | ICD-10-CM

## 2014-12-15 HISTORY — DX: Iron deficiency anemia, unspecified: D50.9

## 2014-12-15 HISTORY — DX: Other pericardial effusion (noninflammatory): I31.39

## 2014-12-15 HISTORY — DX: Pericardial effusion (noninflammatory): I31.3

## 2014-12-15 HISTORY — DX: Type 2 diabetes mellitus with hyperglycemia: E11.65

## 2014-12-15 LAB — CBC WITH DIFFERENTIAL/PLATELET
BASOS ABS: 0.1 10*3/uL (ref 0–0.1)
EOS ABS: 0.2 10*3/uL (ref 0–0.7)
HCT: 30 % — ABNORMAL LOW (ref 35.0–47.0)
Hemoglobin: 9 g/dL — ABNORMAL LOW (ref 12.0–16.0)
Lymphocytes Relative: 25 %
Lymphs Abs: 1.5 10*3/uL (ref 1.0–3.6)
MCH: 21.1 pg — AB (ref 26.0–34.0)
MCHC: 30 g/dL — ABNORMAL LOW (ref 32.0–36.0)
MCV: 70.3 fL — ABNORMAL LOW (ref 80.0–100.0)
Monocytes Absolute: 0.5 10*3/uL (ref 0.2–0.9)
Monocytes Relative: 8 %
Neutro Abs: 3.7 10*3/uL (ref 1.4–6.5)
Neutrophils Relative %: 63 %
PLATELETS: 408 10*3/uL (ref 150–440)
RBC: 4.28 MIL/uL (ref 3.80–5.20)
RDW: 16.7 % — ABNORMAL HIGH (ref 11.5–14.5)
WBC: 5.9 10*3/uL (ref 3.6–11.0)

## 2014-12-15 LAB — COMPREHENSIVE METABOLIC PANEL
ALK PHOS: 78 U/L (ref 38–126)
ALT: 21 U/L (ref 14–54)
ANION GAP: 5 (ref 5–15)
AST: 25 U/L (ref 15–41)
Albumin: 2.5 g/dL — ABNORMAL LOW (ref 3.5–5.0)
BUN: 14 mg/dL (ref 6–20)
CHLORIDE: 99 mmol/L — AB (ref 101–111)
CO2: 31 mmol/L (ref 22–32)
Calcium: 8 mg/dL — ABNORMAL LOW (ref 8.9–10.3)
Creatinine, Ser: 1 mg/dL (ref 0.44–1.00)
GFR calc non Af Amer: >60 mL/min (ref >60)
GLUCOSE: 213 mg/dL — AB (ref 65–99)
Potassium: 3.6 mmol/L (ref 3.5–5.1)
SODIUM: 135 mmol/L (ref 135–145)
Total Bilirubin: 0.1 mg/dL — ABNORMAL LOW (ref 0.3–1.2)
Total Protein: 5.7 g/dL — ABNORMAL LOW (ref 6.5–8.1)

## 2014-12-15 LAB — GLUCOSE, CAPILLARY
Glucose-Capillary: 281 mg/dL — ABNORMAL HIGH (ref 65–99)
Glucose-Capillary: 242 mg/dL — ABNORMAL HIGH (ref 65–99)

## 2014-12-15 LAB — PREGNANCY, URINE: Preg Test, Ur: NEGATIVE

## 2014-12-15 LAB — TROPONIN I: TROPONIN I: 0.06 ng/mL — AB (ref <0.031)

## 2014-12-15 LAB — SEDIMENTATION RATE: SED RATE: 55 mm/h — AB (ref 0–20)

## 2014-12-15 LAB — TSH: TSH: 2.422 u[IU]/mL (ref 0.350–4.500)

## 2014-12-15 LAB — HEMOGLOBIN A1C: HEMOGLOBIN A1C: 13.2 % — AB (ref 4.0–6.0)

## 2014-12-15 LAB — BRAIN NATRIURETIC PEPTIDE: B NATRIURETIC PEPTIDE 5: 91 pg/mL (ref 0.0–100.0)

## 2014-12-15 LAB — FIBRIN DERIVATIVES D-DIMER (ARMC ONLY): FIBRIN DERIVATIVES D-DIMER (ARMC): 1145 — AB (ref 0–499)

## 2014-12-15 LAB — POCT PREGNANCY, URINE: PREG TEST UR: NEGATIVE

## 2014-12-15 IMAGING — CR DG CHEST 2V
2 series · 2 of 2 positions shown · non-contrast
Comparison: [DATE]

CLINICAL DATA: Shortness of Breath

EXAM:
CHEST  2 VIEW

[chest pa]
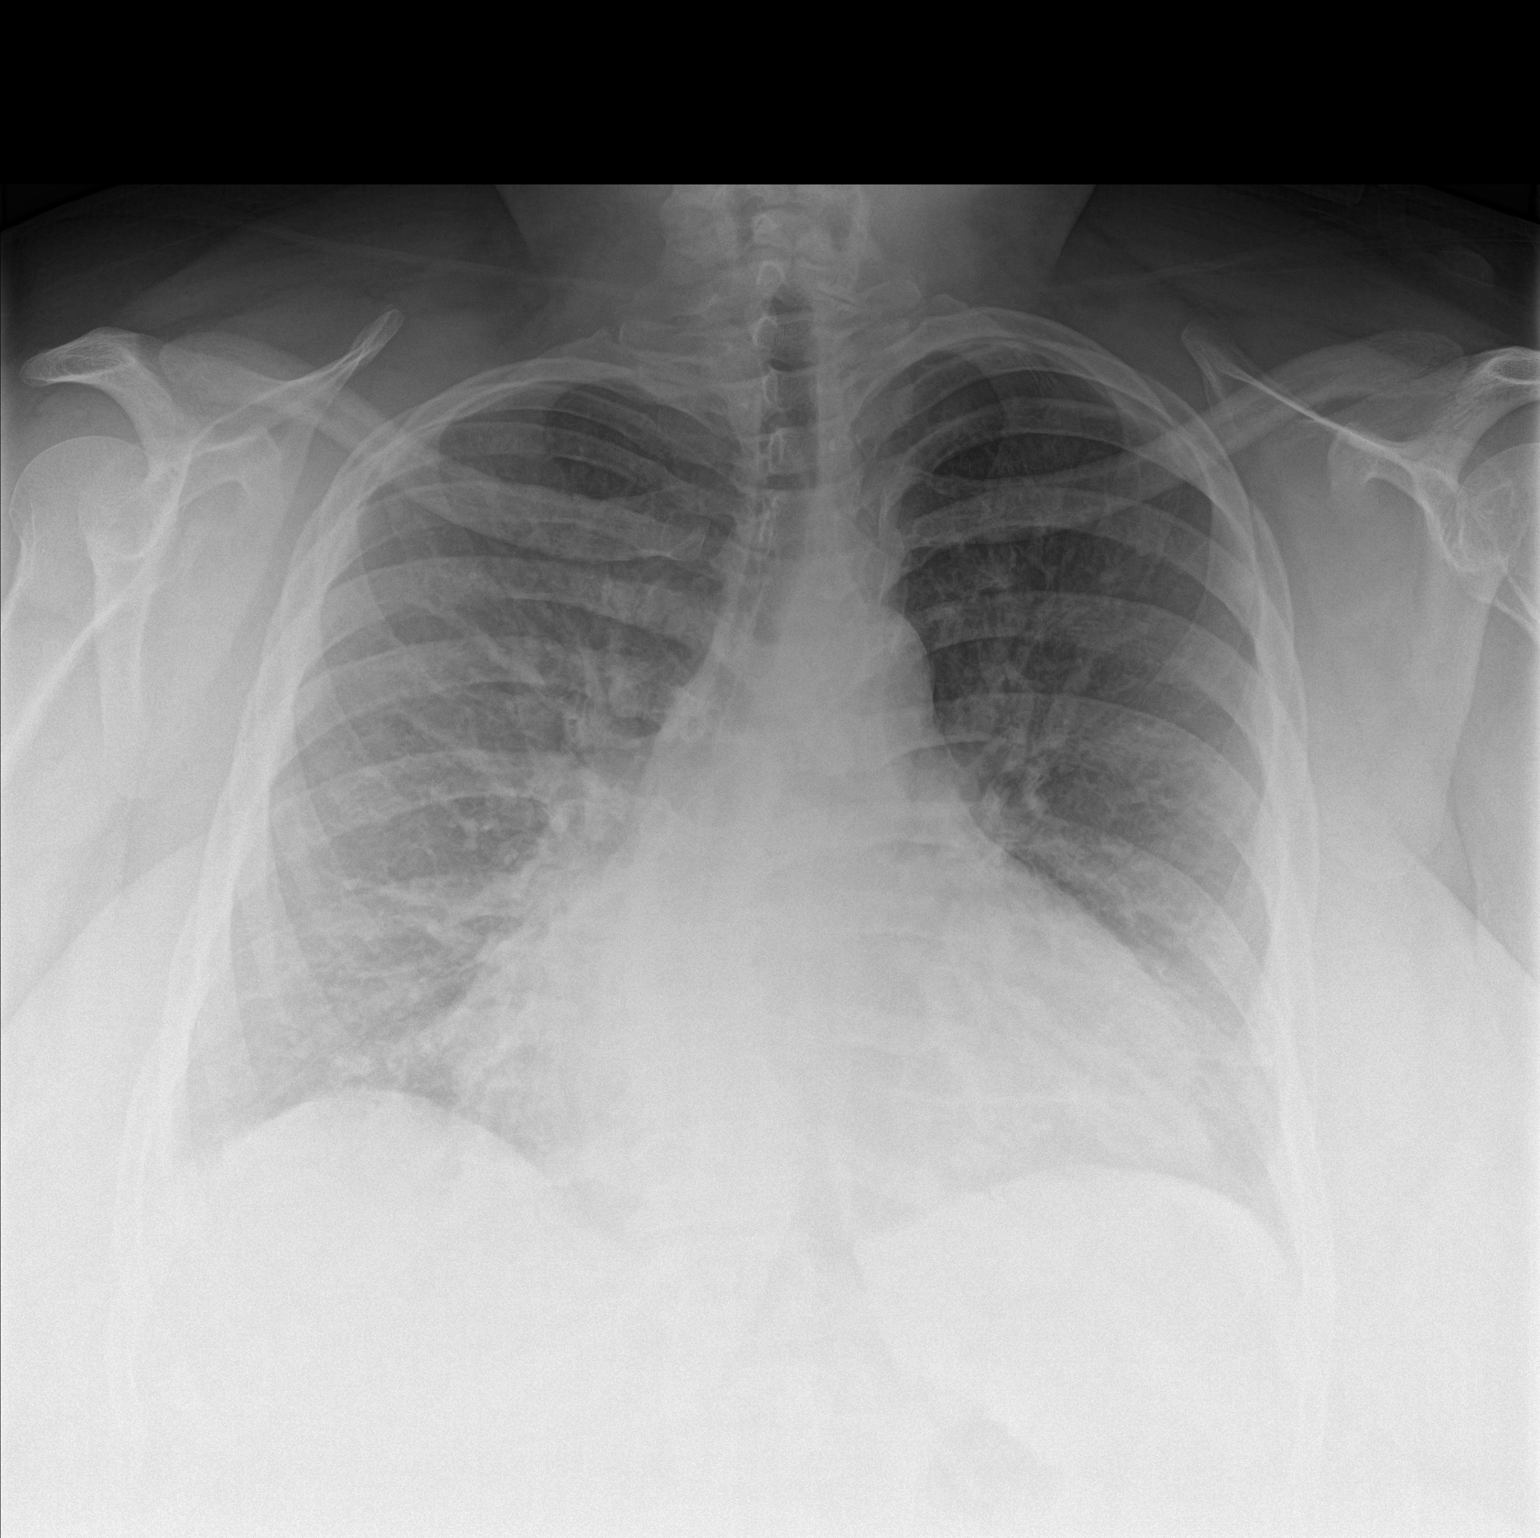

[chest lat]
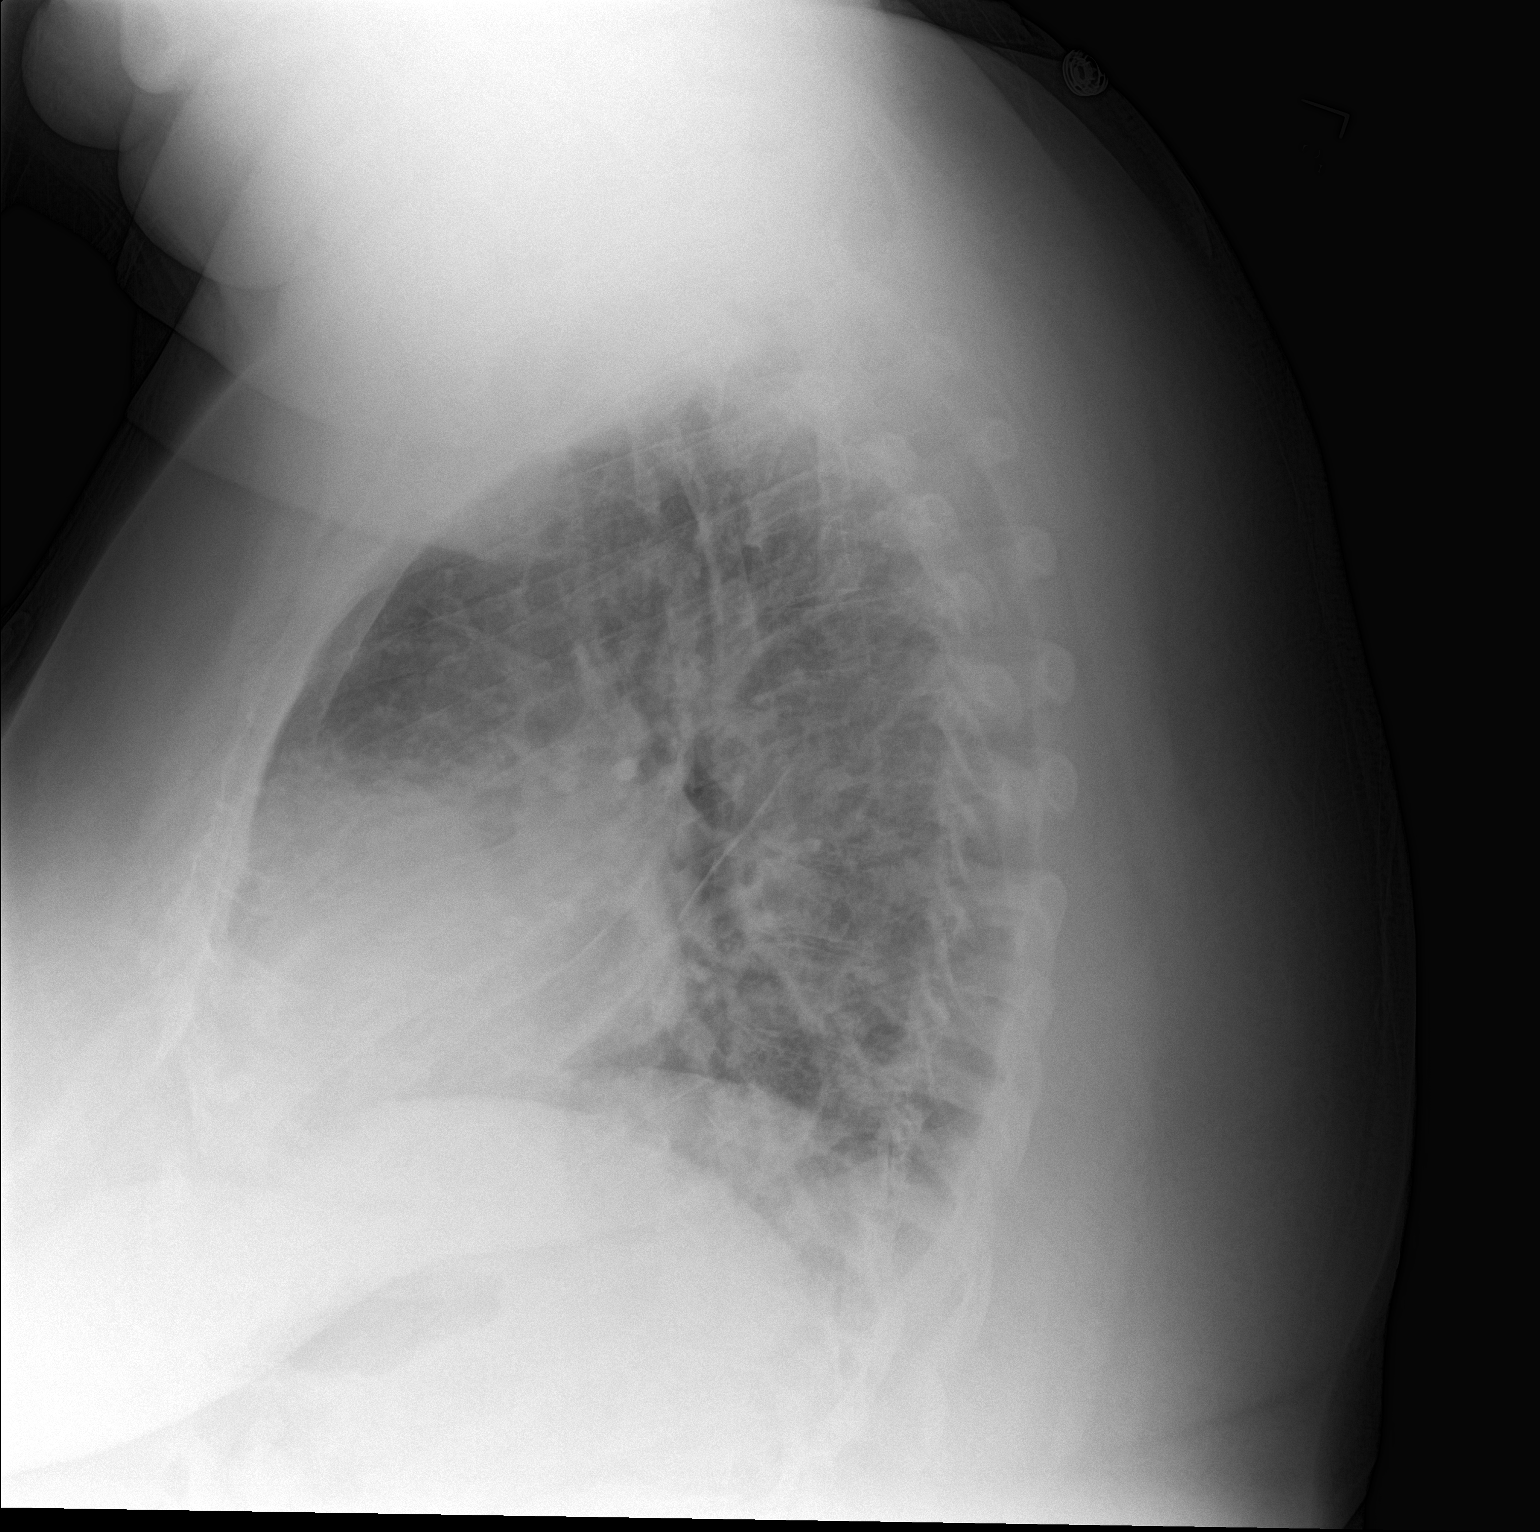

[2 of 2 positions shown; findings below may reference images not displayed]

FINDINGS: Cardiomediastinal silhouette is stable. There is streaky right base
medially atelectasis or early infiltrate. No pulmonary edema.
IMPRESSION: Streaky right base medially atelectasis or early infiltrate. No
pulmonary edema.

## 2014-12-15 IMAGING — CT CT ANGIO CHEST
1 of 2 series · 18 of 30 positions shown · IV contrast (APPLIED)
Comparison: None.

CLINICAL DATA: Severe dyspnea

EXAM:
CT ANGIOGRAPHY CHEST WITH CONTRAST
TECHNIQUE: Multidetector CT imaging of the chest was performed using the
standard protocol during bolus administration of intravenous
contrast. Multiplanar CT image reconstructions and MIPs were
obtained to evaluate the vascular anatomy.
CONTRAST:  100mL OMNIPAQUE IOHEXOL 350 MG/ML SOLN

[Series 5: pe 1.0 thins · axial · 0.73mm/px · z∈[-318,-51]mm · 18 of 301 slices shown]
[im 17/301  lung]
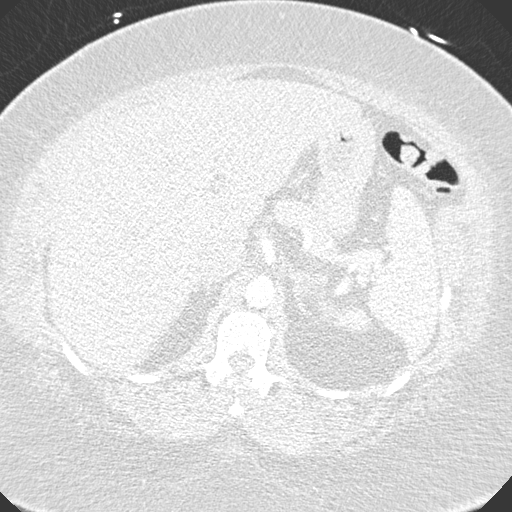
[im 34/301  mediastinal]
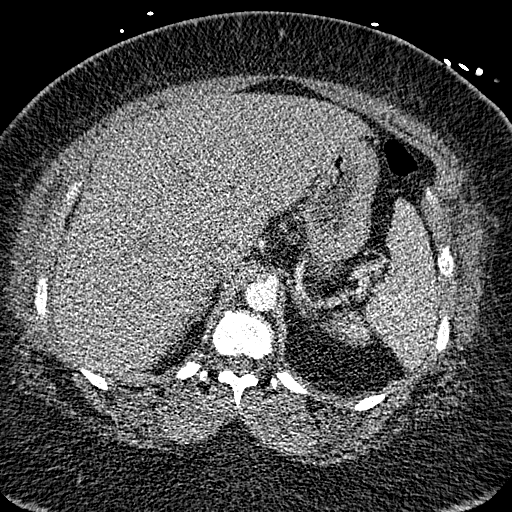
[im 51/301  lung]
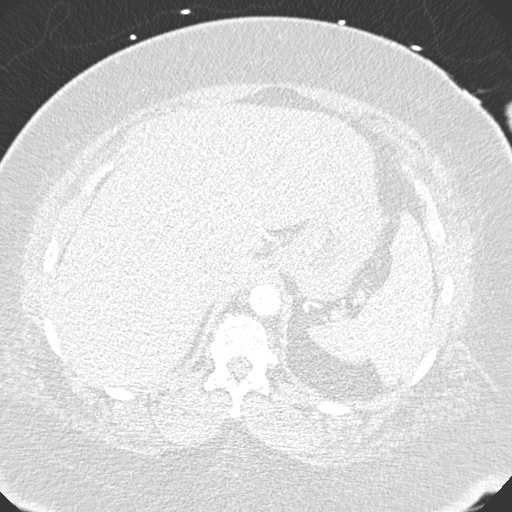
[im 67/301  mediastinal]
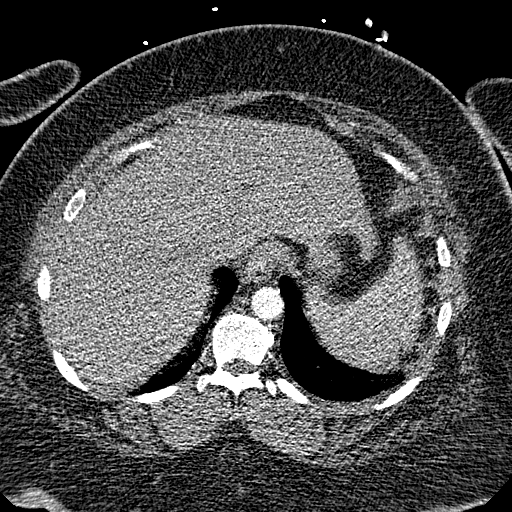
[im 84/301  lung]
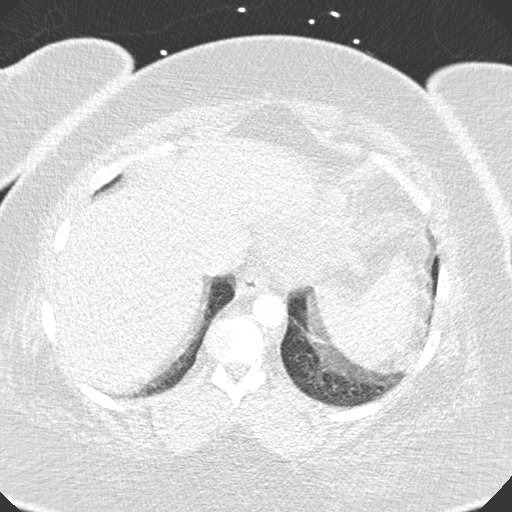
[im 101/301  mediastinal]
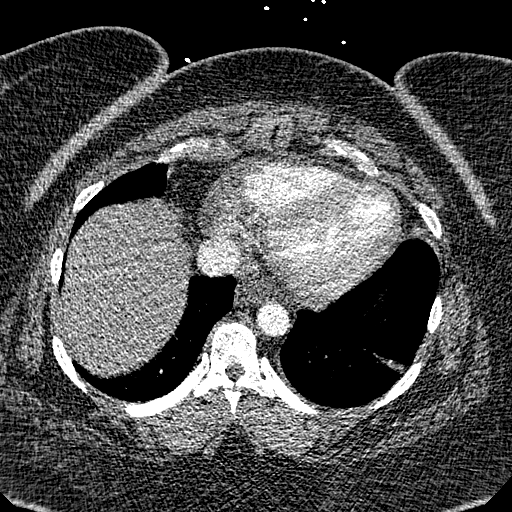
[im 117/301  lung]
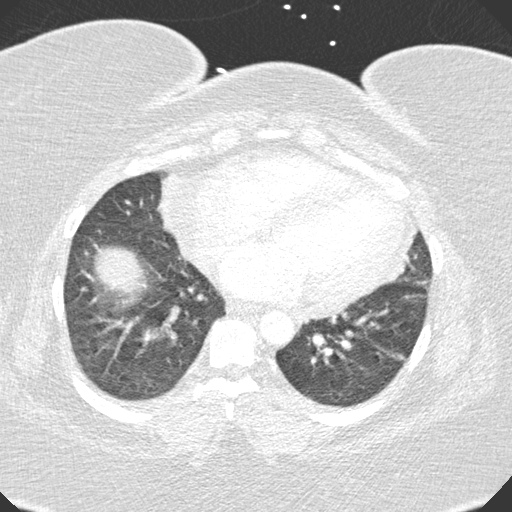
[im 134/301  mediastinal]
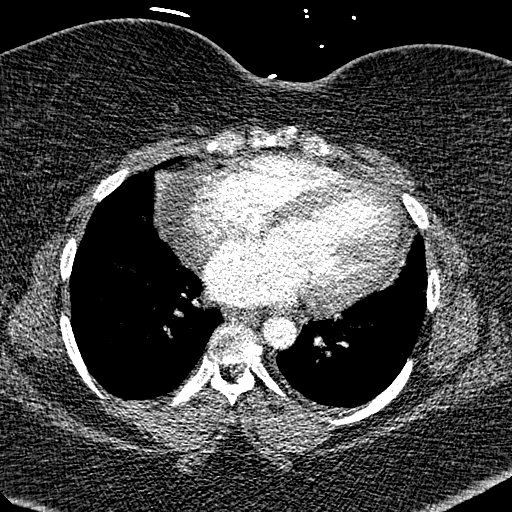
[im 141/301  lung]
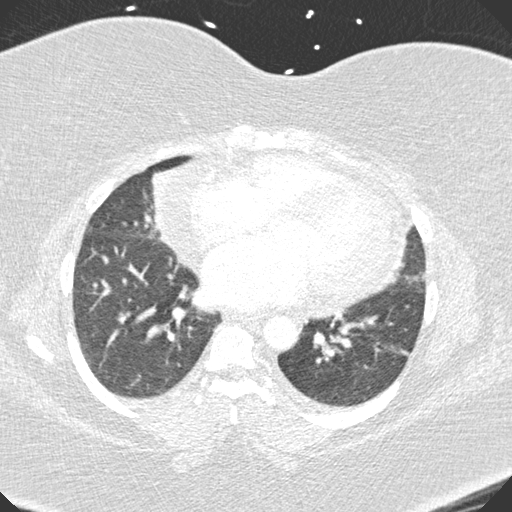
[im 151/301  mediastinal]
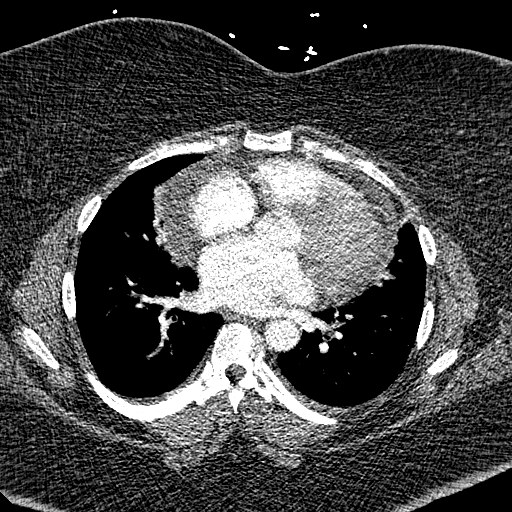
[im 167/301  lung]
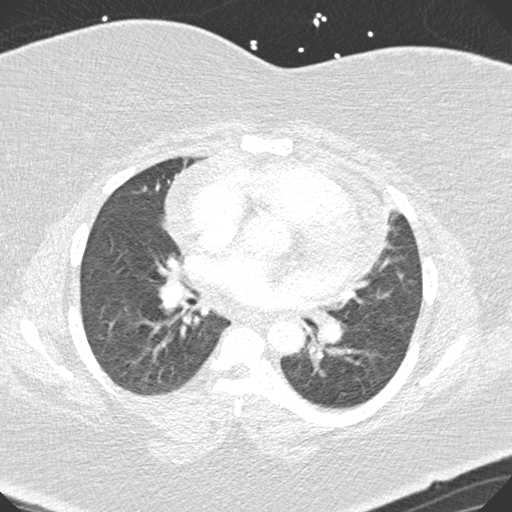
[im 184/301  mediastinal]
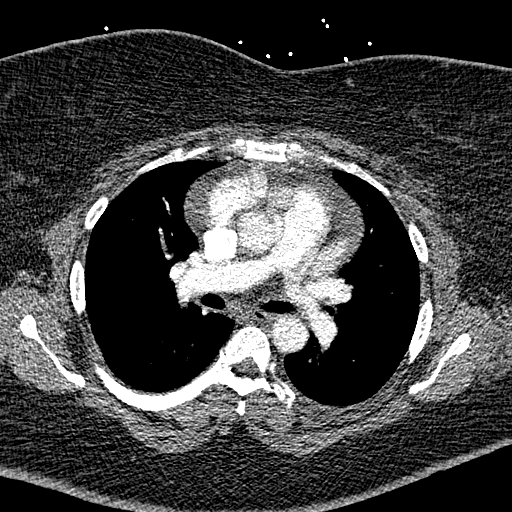
[im 201/301  lung]
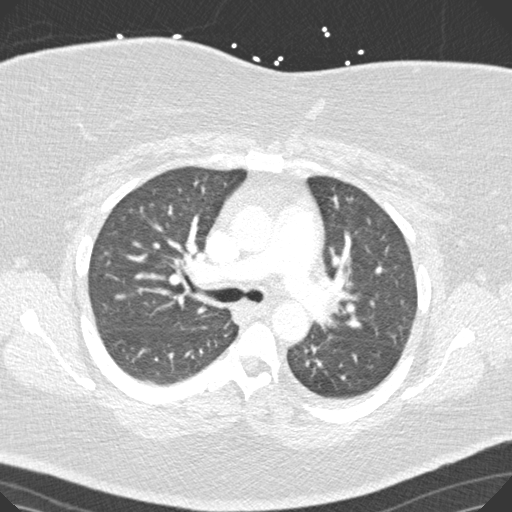
[im 217/301  mediastinal]
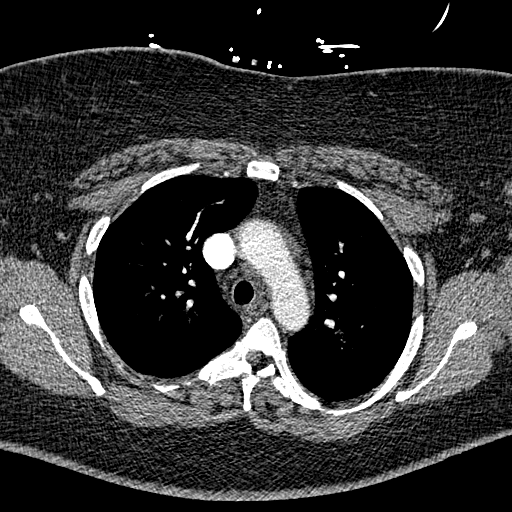
[im 234/301  lung]
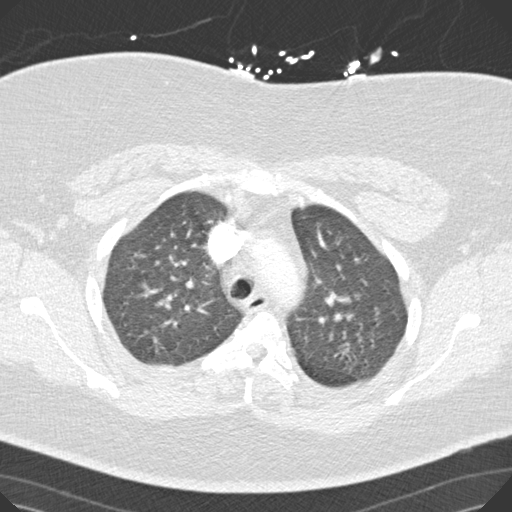
[im 251/301  mediastinal]
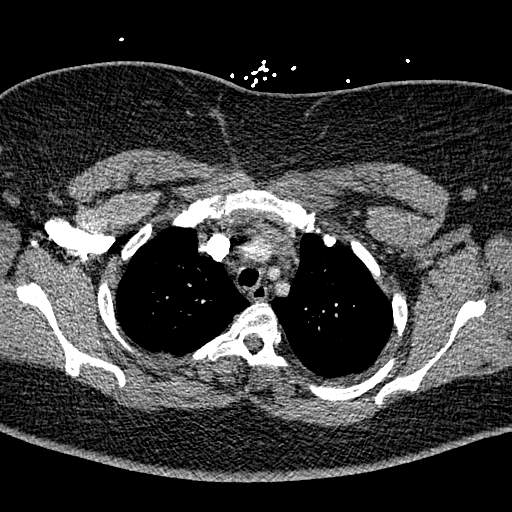
[im 267/301  lung]
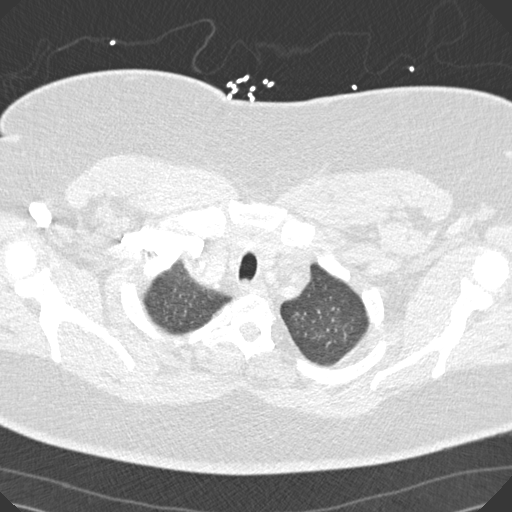
[im 284/301  mediastinal]
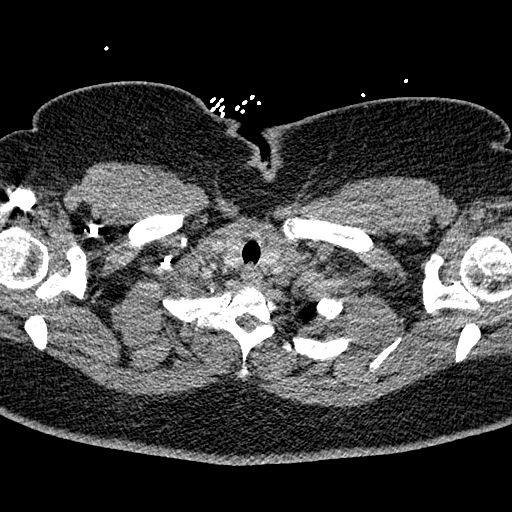

[18 of 30 positions shown; findings below may reference images not displayed]

FINDINGS: THORACIC INLET/BODY WALL:

No acute abnormality.

MEDIASTINUM:

Mild cardiomegaly with a moderate low-density pericardial effusion
without serosal enhancement or nodularity. The fluid has increased
from abdominal CT [DATE]. No straightening of the
interventricular septum or free wall collapse. Scan is essentially
free breathing, limiting evaluation of the pulmonary arteries. No
evidence of pulmonary embolism, with sensitivity limited to the
segmental level and proximal. No suspicious lymph nodes. Negative
aorta.

LUNG WINDOWS:

Diffuse bronchial wall thickening with scattered subsegmental
atelectasis. Small pleural effusions without pulmonary edema.

UPPER ABDOMEN:

No acute findings.

OSSEOUS:

No acute fracture. No suspicious lytic or blastic lesions. Thoracic
dextroscoliosis.

Review of the MIP images confirms the above findings.
IMPRESSION: 1. CTA of the pulmonary arteries is limited by motion. No evidence
of pulmonary embolism to the segmental level.
2. Bronchitis with subsegmental atelectasis.
3. Moderate pericardial effusion, increased from abdominal CT
[DATE], and trace pleural effusions.

## 2014-12-15 MED ORDER — IBUPROFEN 800 MG PO TABS
800.0000 mg | ORAL_TABLET | Freq: Three times a day (TID) | ORAL | Status: DC | PRN
  Administered 2014-12-16 – 2014-12-19 (×3): 800 mg via ORAL
  Filled 2014-12-15 (×6): qty 1

## 2014-12-15 MED ORDER — INSULIN ASPART PROT & ASPART (70-30 MIX) 100 UNIT/ML ~~LOC~~ SUSP
15.0000 [IU] | Freq: Two times a day (BID) | SUBCUTANEOUS | Status: DC
  Administered 2014-12-15 – 2014-12-16 (×2): 15 [IU] via SUBCUTANEOUS
  Filled 2014-12-15 (×2): qty 15

## 2014-12-15 MED ORDER — ASPIRIN 81 MG PO CHEW
324.0000 mg | CHEWABLE_TABLET | Freq: Once | ORAL | Status: AC
  Administered 2014-12-15: 324 mg via ORAL
  Filled 2014-12-15: qty 4

## 2014-12-15 MED ORDER — IOHEXOL 350 MG/ML SOLN
100.0000 mL | Freq: Once | INTRAVENOUS | Status: AC | PRN
  Administered 2014-12-15: 100 mL via INTRAVENOUS

## 2014-12-15 MED ORDER — ONDANSETRON HCL 4 MG PO TABS: 4.0000 mg | ORAL_TABLET | Freq: Four times a day (QID) | ORAL | Status: DC | PRN

## 2014-12-15 MED ORDER — HYDROCHLOROTHIAZIDE 25 MG PO TABS
25.0000 mg | ORAL_TABLET | Freq: Every day | ORAL | Status: DC
Start: 2014-12-15 — End: 2014-12-18
  Administered 2014-12-15 – 2014-12-17 (×3): 25 mg via ORAL
  Filled 2014-12-15 (×3): qty 1

## 2014-12-15 MED ORDER — ACETAMINOPHEN 650 MG RE SUPP: 650.0000 mg | Freq: Four times a day (QID) | RECTAL | Status: DC | PRN

## 2014-12-15 MED ORDER — ACETAMINOPHEN 325 MG PO TABS
650.0000 mg | ORAL_TABLET | Freq: Four times a day (QID) | ORAL | Status: DC | PRN
  Administered 2014-12-16 – 2014-12-18 (×7): 650 mg via ORAL
  Filled 2014-12-15 (×7): qty 2

## 2014-12-15 MED ORDER — SENNOSIDES-DOCUSATE SODIUM 8.6-50 MG PO TABS: 1.0000 | ORAL_TABLET | Freq: Every evening | ORAL | Status: DC | PRN

## 2014-12-15 MED ORDER — FUROSEMIDE 10 MG/ML IJ SOLN
60.0000 mg | Freq: Two times a day (BID) | INTRAMUSCULAR | Status: DC
  Administered 2014-12-15: 60 mg via INTRAVENOUS
  Filled 2014-12-15 (×2): qty 6

## 2014-12-15 MED ORDER — ALBUTEROL SULFATE (2.5 MG/3ML) 0.083% IN NEBU
2.5000 mg | INHALATION_SOLUTION | RESPIRATORY_TRACT | Status: DC | PRN
  Administered 2014-12-15 – 2014-12-16 (×3): 2.5 mg via RESPIRATORY_TRACT
  Filled 2014-12-15 (×3): qty 3

## 2014-12-15 MED ORDER — HYDRALAZINE HCL 20 MG/ML IJ SOLN
10.0000 mg | INTRAMUSCULAR | Status: DC | PRN
  Administered 2014-12-15 – 2014-12-16 (×3): 10 mg via INTRAVENOUS
  Filled 2014-12-15 (×3): qty 1

## 2014-12-15 MED ORDER — INSULIN ASPART 100 UNIT/ML ~~LOC~~ SOLN
0.0000 [IU] | Freq: Three times a day (TID) | SUBCUTANEOUS | Status: DC
  Administered 2014-12-15: 8 [IU] via SUBCUTANEOUS
  Administered 2014-12-16: 5 [IU] via SUBCUTANEOUS
  Administered 2014-12-16 (×2): 11 [IU] via SUBCUTANEOUS
  Administered 2014-12-17: 8 [IU] via SUBCUTANEOUS
  Administered 2014-12-17: 13 [IU] via SUBCUTANEOUS
  Administered 2014-12-17 – 2014-12-18 (×2): 8 [IU] via SUBCUTANEOUS
  Administered 2014-12-18: 13 [IU] via SUBCUTANEOUS
  Administered 2014-12-19: 11 [IU] via SUBCUTANEOUS
  Filled 2014-12-15: qty 13
  Filled 2014-12-15: qty 11
  Filled 2014-12-15: qty 3
  Filled 2014-12-15: qty 11
  Filled 2014-12-15: qty 8
  Filled 2014-12-15: qty 13
  Filled 2014-12-15 (×2): qty 8
  Filled 2014-12-15: qty 5
  Filled 2014-12-15: qty 11
  Filled 2014-12-15: qty 5

## 2014-12-15 MED ORDER — FUROSEMIDE 10 MG/ML IJ SOLN
60.0000 mg | Freq: Once | INTRAMUSCULAR | Status: AC
  Administered 2014-12-16: 60 mg via INTRAVENOUS
  Filled 2014-12-15: qty 6

## 2014-12-15 MED ORDER — LISINOPRIL 20 MG PO TABS
40.0000 mg | ORAL_TABLET | Freq: Every day | ORAL | Status: DC
  Administered 2014-12-15 – 2014-12-18 (×4): 40 mg via ORAL
  Filled 2014-12-15 (×5): qty 2

## 2014-12-15 MED ORDER — LABETALOL HCL 5 MG/ML IV SOLN
10.0000 mg | Freq: Once | INTRAVENOUS | Status: AC
  Administered 2014-12-15: 10 mg via INTRAVENOUS
  Filled 2014-12-15: qty 4

## 2014-12-15 MED ORDER — ALBUTEROL SULFATE HFA 108 (90 BASE) MCG/ACT IN AERS: 2.0000 | INHALATION_SPRAY | Freq: Four times a day (QID) | RESPIRATORY_TRACT | Status: DC | PRN

## 2014-12-15 MED ORDER — POTASSIUM CHLORIDE CRYS ER 20 MEQ PO TBCR
40.0000 meq | EXTENDED_RELEASE_TABLET | ORAL | Status: AC
  Administered 2014-12-15 (×2): 40 meq via ORAL
  Filled 2014-12-15 (×2): qty 2

## 2014-12-15 MED ORDER — INSULIN ASPART 100 UNIT/ML ~~LOC~~ SOLN
0.0000 [IU] | Freq: Every day | SUBCUTANEOUS | Status: DC
  Administered 2014-12-15 – 2014-12-16 (×2): 2 [IU] via SUBCUTANEOUS
  Administered 2014-12-17: 4 [IU] via SUBCUTANEOUS
  Administered 2014-12-18: 5 [IU] via SUBCUTANEOUS
  Filled 2014-12-15: qty 3
  Filled 2014-12-15: qty 4
  Filled 2014-12-15: qty 2
  Filled 2014-12-15: qty 3

## 2014-12-15 MED ORDER — ONDANSETRON HCL 4 MG/2ML IJ SOLN: 4.0000 mg | Freq: Four times a day (QID) | INTRAMUSCULAR | Status: DC | PRN

## 2014-12-15 MED ORDER — ASPIRIN EC 81 MG PO TBEC
81.0000 mg | DELAYED_RELEASE_TABLET | Freq: Every day | ORAL | Status: DC
  Administered 2014-12-16 – 2014-12-19 (×4): 81 mg via ORAL
  Filled 2014-12-15 (×4): qty 1

## 2014-12-15 MED ORDER — DIAZEPAM 5 MG PO TABS
5.0000 mg | ORAL_TABLET | Freq: Three times a day (TID) | ORAL | Status: DC | PRN
  Administered 2014-12-18 – 2014-12-19 (×2): 5 mg via ORAL
  Filled 2014-12-15 (×2): qty 1

## 2014-12-15 MED ORDER — SODIUM CHLORIDE 0.9 % IJ SOLN
3.0000 mL | Freq: Two times a day (BID) | INTRAMUSCULAR | Status: DC
  Administered 2014-12-15 – 2014-12-18 (×8): 3 mL via INTRAVENOUS

## 2014-12-15 MED ORDER — ENOXAPARIN SODIUM 40 MG/0.4ML ~~LOC~~ SOLN
40.0000 mg | SUBCUTANEOUS | Status: DC
  Administered 2014-12-15: 40 mg via SUBCUTANEOUS
  Filled 2014-12-15: qty 0.4

## 2014-12-15 NOTE — Progress Notes (Signed)
*  PRELIMINARY RESULTS* Echocardiogram 2D Echocardiogram has been performed.  [name] 12/15/2014, 4:56 PM

## 2014-12-15 NOTE — ED Notes (Signed)
Says sob with any exertion and can't laydown   Also swelling in lower extremities.

## 2014-12-15 NOTE — ED Notes (Signed)
Critical Troponin 0.06 reported to Dr Jimmye Norman.

## 2014-12-15 NOTE — ED Provider Notes (Signed)
[facility] Emergency Department Provider Note     Time seen: ----------------------------------------- 9:03 AM on 12/15/2014 -----------------------------------------    I have reviewed the triage vital signs and the nursing notes.   HISTORY  Chief Complaint Cough; Nasal Congestion; and Shortness of Breath    HPI [name] is a 44 y.o. female who presents to ER with progressive shortness of breath and dyspnea on exertion. Patient states she also has orthopnea, sitting makes her symptoms better. She does note swelling on her upper and lower extremities, has noted a 30 pound weight gain over the last 6 months. She denies any fevers, chest pain, vomiting or diarrhea. She's been taking her albuterol nebulizer without any improvement. She's also been out of her medications recently due to insurance problems.   Past Medical History  Diagnosis Date  . Diabetes mellitus without complication ([place])   . Anemia   . Asthma   . Hypertension     There are no active problems to display for this patient.   Past Surgical History  Procedure Laterality Date  . Cesarean section      Allergies Review of patient's allergies indicates no known allergies.  Social History Social History  Substance Use Topics  . Smoking status: Never Smoker   . Smokeless tobacco: None  . Alcohol Use: Yes    Review of Systems Constitutional: Negative for fever. Eyes: Negative for visual changes. ENT: Negative for sore throat. Cardiovascular: Negative for chest pain. Respiratory: Positive for shortness of breath Gastrointestinal: Negative for abdominal pain, vomiting and diarrhea. Genitourinary: Negative for dysuria. Musculoskeletal: Negative for back pain. Positive for edema Skin: Negative for rash. Neurological: Negative for headaches, focal weakness or numbness.  10-point ROS otherwise negative.  ____________________________________________   PHYSICAL  EXAM:  VITAL SIGNS: ED Triage Vitals  Enc Vitals Group     BP 12/15/14 0848 181/104 mmHg     Pulse Rate 12/15/14 0845 95     Resp 12/15/14 0845 18     Temp 12/15/14 0845 98 F (36.7 C)     Temp Source 12/15/14 0845 Oral     SpO2 12/15/14 0845 99 %     Weight 12/15/14 0848 333 lb 5 oz (151.19 kg)     Height 12/15/14 0845 5\' 4"  (1.626 m)     Head Cir --      Peak Flow --      Pain Score 12/15/14 0846 8     Pain Loc --      Pain Edu? --      Excl. in [place]? --     Constitutional: Alert and oriented. Morbidly obese, no acute distress Eyes: Conjunctivae are normal. PERRL. Normal extraocular movements. ENT   Head: Normocephalic and atraumatic.   Nose: No congestion/rhinnorhea.   Mouth/Throat: Mucous membranes are moist.   Neck: No stridor. Cardiovascular: Normal rate, regular rhythm. Normal and symmetric distal pulses are present in all extremities. No murmurs, rubs, or gallops. Respiratory: Normal respiratory effort without tachypnea nor retractions. Breath sounds are clear and equal bilaterally. No wheezes/rales/rhonchi. Gastrointestinal: Soft and nontender. No distention. No abdominal bruits.  Musculoskeletal: Nontender with normal range of motion in all extremities. No joint effusions.  No lower extremity tenderness, mild bilateral lower extremity edema Neurologic:  Normal speech and language. No gross focal neurologic deficits are appreciated. Speech is normal. No gait instability. Skin:  Skin is warm, dry and intact. No rash noted. Psychiatric: Mood and affect are normal. Speech and behavior are normal. Patient exhibits  appropriate insight and judgment. ____________________________________________  EKG: Interpreted by me. Sinus rhythm with a rate of 97 bpm, normal axis. No evidence of hypertrophy or acute infarction.  ____________________________________________  ED COURSE:  Pertinent labs & imaging results that were available during my care of the patient were  reviewed by me and considered in my medical decision making (see chart for details). Patient likely with dyspnea that is multifactorial. She doesn't history of anemia as well as asthma and morbid obesity. I will check labs, imaging and reevaluate ____________________________________________    LABS (pertinent positives/negatives)  Labs Reviewed  CBC WITH DIFFERENTIAL/PLATELET - Abnormal; Notable for the following:    Hemoglobin 9.0 (*)    HCT 30.0 (*)    MCV 70.3 (*)    MCH 21.1 (*)    MCHC 30.0 (*)    RDW 16.7 (*)    All other components within normal limits  TROPONIN I - Abnormal; Notable for the following:    Troponin I 0.06 (*)    All other components within normal limits  FIBRIN DERIVATIVES D-DIMER ([facility] ONLY) - Abnormal; Notable for the following:    Fibrin derivatives D-dimer ([facility]) 1145 (*)    All other components within normal limits  COMPREHENSIVE METABOLIC PANEL - Abnormal; Notable for the following:    Chloride 99 (*)    Glucose, Bld 213 (*)    Calcium 8.0 (*)    Total Protein 5.7 (*)    Albumin 2.5 (*)    Total Bilirubin 0.1 (*)    All other components within normal limits  BRAIN NATRIURETIC PEPTIDE  PREGNANCY, URINE  POC URINE PREG, ED  POCT PREGNANCY, URINE    RADIOLOGY Images were viewed by me  Chest x-ray, chest CTA IMPRESSION: Streaky right base medially atelectasis or early infiltrate. No pulmonary edema. IMPRESSION: 1. CTA of the pulmonary arteries is limited by motion. No evidence of pulmonary embolism to the segmental level. 2. Bronchitis with subsegmental atelectasis. 3. Moderate pericardial effusion, increased from abdominal CT 10/28/2014, and trace pleural effusions.  CRITICAL CARE Performed by: Earleen Newport   Total critical care time: 30 minutes  Critical care time was exclusive of separately billable procedures and treating other patients.  Critical care was necessary to treat or prevent imminent or life-threatening  deterioration.  Critical care was time spent personally by me on the following activities: development of treatment plan with patient and/or surrogate as well as nursing, discussions with consultants, evaluation of patient's response to treatment, examination of patient, obtaining history from patient or surrogate, ordering and performing treatments and interventions, ordering and review of laboratory studies, ordering and review of radiographic studies, pulse oximetry and re-evaluation of patient's condition.  ____________________________________________  FINAL ASSESSMENT AND PLAN  Dyspnea, elevated troponin, pericardial effusion  Plan: Patient with labs and imaging as dictated above. Patient will be started on aspirin, unclear if she needs heparin at this time. She will need cardiology consult and likely echocardiogram. She may also need to be considered for blood transfusion given her elevated troponin.   Earleen Newport, MD   Earleen Newport, MD 12/15/14 [phone]

## 2014-12-15 NOTE — Progress Notes (Signed)
Furosemide order was scheduled to only give twice then be completed. This RN read the order wrong and marked out the 12:15 dose since patient was in the ER at this time and did not want to give the doses too close together. After calling pharmacist, stated to order a one time dose of 60mg  of lasix at 5AM so the patient will get both doses far enough apart.

## 2014-12-15 NOTE — H&P (Signed)
[facility] NAME: [name]    MR#:  [id number]  DATE OF BIRTH:  08-May-1970  DATE OF ADMISSION:  12/15/2014  PRIMARY CARE PHYSICIAN: Kasandra Knudsen, NP   REQUESTING/REFERRING PHYSICIAN: Dr. Jimmye Norman  CHIEF COMPLAINT:   Chief Complaint  Patient presents with  . Cough  . Nasal Congestion  . Shortness of Breath    HISTORY OF PRESENT ILLNESS:  [name]  is a 44 y.o. female with a known history of hypertension, diabetes not taking medications for 1 month presents to the emergency room complaining of worsening shortness of breath. She has noticed that she's extremely fatigued for one month along with exertional dyspnea. Her shortness of breath tends to improve with inhalers but has not responded in the last month. No orthopnea. Complains of lower extremity edema. She has gained 40 pounds in the last 6 months. Stop taking medications a month prior due to insurance issues. She also complains of back pain on ambulation. She does take ibuprofen 1-3 times a day for 3 months for her back pain.  PAST MEDICAL HISTORY:   Past Medical History  Diagnosis Date  . Diabetes mellitus without complication ([place])   . Anemia   . Asthma   . Hypertension     PAST SURGICAL HISTORY:   Past Surgical History  Procedure Laterality Date  . Cesarean section      SOCIAL HISTORY:   Social History  Substance Use Topics  . Smoking status: Never Smoker   . Smokeless tobacco: Not on file  . Alcohol Use: Yes    FAMILY HISTORY:  No family history on file. DM, HTN, CVA DRUG ALLERGIES:   Allergies  Allergen Reactions  . Sulfa Antibiotics Itching    REVIEW OF SYSTEMS:   Review of Systems  Constitutional: Positive for malaise/fatigue. Negative for fever, chills and weight loss.  HENT: Negative for hearing loss and nosebleeds.   Eyes: Negative for blurred vision, double vision and pain.  Respiratory: Positive for cough and  shortness of breath. Negative for hemoptysis, sputum production and wheezing.   Cardiovascular: Positive for leg swelling. Negative for chest pain, palpitations and orthopnea.  Gastrointestinal: Positive for nausea. Negative for vomiting, abdominal pain, diarrhea and constipation.  Genitourinary: Negative for dysuria and hematuria.  Musculoskeletal: Negative for myalgias, back pain and falls.  Skin: Negative for rash.  Neurological: Positive for weakness. Negative for dizziness, tremors, sensory change, speech change, focal weakness, seizures and headaches.  Endo/Heme/Allergies: Does not bruise/bleed easily.  Psychiatric/Behavioral: Negative for depression and memory loss. The patient is not nervous/anxious.     MEDICATIONS AT HOME:   Prior to Admission medications   Medication Sig Start Date End Date Taking? Authorizing Provider  albuterol (PROVENTIL HFA;VENTOLIN HFA) 108 (90 BASE) MCG/ACT inhaler Inhale 2 puffs into the lungs every 6 (six) hours as needed for wheezing or shortness of breath.   Yes Historical Provider, MD  insulin aspart (NOVOLOG) 100 UNIT/ML injection Inject 30 Units into the skin 2 (two) times daily.   Yes Historical Provider, MD  azithromycin (ZITHROMAX Z-PAK) 250 MG tablet Take 2 tablets (500 mg) on  Day 1,  followed by 1 tablet (250 mg) once daily on Days 2 through 5. 11/20/14   Jenise V Bacon Menshew, PA-C  diazepam (VALIUM) 5 MG tablet Take 1 tablet (5 mg total) by mouth every 8 (eight) hours as needed for muscle spasms. 10/28/14   Earleen Newport, MD  ibuprofen (ADVIL,MOTRIN) 800 MG  tablet Take 1 tablet (800 mg total) by mouth every 8 (eight) hours as needed. 10/28/14   Earleen Newport, MD  mupirocin ointment Drue Stager) 2 % Apply to affected area 3 times daily 10/28/14 10/28/15  Earleen Newport, MD  predniSONE (STERAPRED UNI-PAK 21 TAB) 10 MG (21) TBPK tablet Take 1 tablet (10 mg total) by mouth daily. 11/20/14   Jenise V Bacon Menshew, PA-C   sulfamethoxazole-trimethoprim (BACTRIM DS) 800-160 MG per tablet Take 1 tablet by mouth 2 (two) times daily. 10/28/14   Earleen Newport, MD      VITAL SIGNS:  Blood pressure 175/118, pulse 89, temperature 98 F (36.7 C), temperature source Oral, resp. rate 18, height 5' 4"  (1.626 m), weight 151.19 kg (333 lb 5 oz), last menstrual period 12/15/2014, SpO2 98 %.  PHYSICAL EXAMINATION:  Physical Exam  GENERAL:  44 y.o.-year-old patient lying in the bed with no acute distress. Morbidly obese  EYES: Pupils equal, round, reactive to light and accommodation. No scleral icterus. Extraocular muscles intact.  HEENT: Head atraumatic, normocephalic. Oropharynx and nasopharynx clear. No oropharyngeal erythema, moist oral mucosa  NECK:  Supple, no jugular venous distention. No thyroid enlargement, no tenderness.  LUNGS: Normal breath sounds bilaterally, no wheezing, rales, rhonchi. No use of accessory muscles of respiration.  CARDIOVASCULAR: S1, S2 normal. No murmurs, rubs, or gallops.  ABDOMEN: Soft, nontender, nondistended. Bowel sounds present. No organomegaly or mass.  EXTREMITIES: No pedal edema, cyanosis, or clubbing. + 2 pedal & radial pulses b/l.   NEUROLOGIC: Cranial nerves II through XII are intact. No focal Motor or sensory deficits appreciated b/l PSYCHIATRIC: The patient is alert and oriented x 3. Good affect.  SKIN: No obvious rash, lesion, or ulcer.   LABORATORY PANEL:   CBC  Recent Labs Lab 12/15/14 0910  WBC 5.9  HGB 9.0*  HCT 30.0*  PLT 408   ------------------------------------------------------------------------------------------------------------------  Chemistries   Recent Labs Lab 12/15/14 0910  NA 135  K 3.6  CL 99*  CO2 31  GLUCOSE 213*  BUN 14  CREATININE 1.00  CALCIUM 8.0*  AST 25  ALT 21  ALKPHOS 78  BILITOT 0.1*   ------------------------------------------------------------------------------------------------------------------  Cardiac  Enzymes  Recent Labs Lab 12/15/14 0910  TROPONINI 0.06*   ------------------------------------------------------------------------------------------------------------------  RADIOLOGY:  Dg Chest 2 View  12/15/2014  CLINICAL DATA:  Shortness of Breath EXAM: CHEST  2 VIEW COMPARISON:  11/20/2014 FINDINGS: Cardiomediastinal silhouette is stable. There is streaky right base medially atelectasis or early infiltrate. No pulmonary edema. IMPRESSION: Streaky right base medially atelectasis or early infiltrate. No pulmonary edema. Electronically Signed   By: Lahoma Crocker M.D.   On: 12/15/2014 09:46   Ct Angio Chest Pe W/cm &/or Wo Cm  12/15/2014  CLINICAL DATA:  Severe dyspnea EXAM: CT ANGIOGRAPHY CHEST WITH CONTRAST TECHNIQUE: Multidetector CT imaging of the chest was performed using the standard protocol during bolus administration of intravenous contrast. Multiplanar CT image reconstructions and MIPs were obtained to evaluate the vascular anatomy. CONTRAST:  173m OMNIPAQUE IOHEXOL 350 MG/ML SOLN COMPARISON:  None. FINDINGS: THORACIC INLET/BODY WALL: No acute abnormality. MEDIASTINUM: Mild cardiomegaly with a moderate low-density pericardial effusion without serosal enhancement or nodularity. The fluid has increased from abdominal CT 10/28/2014. No straightening of the interventricular septum or free wall collapse. Scan is essentially free breathing, limiting evaluation of the pulmonary arteries. No evidence of pulmonary embolism, with sensitivity limited to the segmental level and proximal. No suspicious lymph nodes. Negative aorta. LUNG WINDOWS: Diffuse bronchial wall thickening with scattered  subsegmental atelectasis. Small pleural effusions without pulmonary edema. UPPER ABDOMEN: No acute findings. OSSEOUS: No acute fracture. No suspicious lytic or blastic lesions. Thoracic dextroscoliosis. Review of the MIP images confirms the above findings. IMPRESSION: 1. CTA of the pulmonary arteries is limited by  motion. No evidence of pulmonary embolism to the segmental level. 2. Bronchitis with subsegmental atelectasis. 3. Moderate pericardial effusion, increased from abdominal CT 10/28/2014, and trace pleural effusions. Electronically Signed   By: Monte Fantasia M.D.   On: 12/15/2014 11:20     IMPRESSION AND PLAN:   * Moderate pericardial effusion Etiology is unclear. Patient had a CT scan of the abdomen done recently which showed a small pericardial effusion. Seems worse on CT scan today. Check echocardiogram. Check ESR, ANA. Possibility of congestive heart failure. Consult cardiology.  * Shortness of breath Likely due to her moderate pericardial effusion. She does have small bilateral pleural effusions. We will give her 2 doses of Lasix 60 mg IV. See if she responds to this with improvement in her breathing. BNP is normal. Would BNP can be normal in obese patients in spite of CHF.  * Accelerated hypertension Stop taking medications a month prior due to insurance issues. Start patient on hydrochlorothiazide and lisinopril. Counseled patient that these medications are on [facility] $4 list.  * Elevated troponin Likely from accelerated hypertension and pericardial effusion. Will repeat troponin. EKG shows no acute changes. Start aspirin.  * Diabetes mellitus Patient stopped taking her Lantus due to insurance issues. We will start her on insulin 70/30 units twice a day as she has been needing about 40 units of NovoLog cumulative 3 times before meals. Sliding scale insulin and diabetic diet.  * Chronic anemia is stable  * Obesity Counseled regarding lifestyle changes  * Asthma Inhalers as needed  All the records are reviewed and case discussed with ED provider. Management plans discussed with the patient, family and they are in agreement.  CODE STATUS: full code  TOTAL TIME TAKING CARE OF THIS PATIENT: 50 minutes.    Hillary Bow R M.D on 12/15/2014 at 12:08 PM  Between 7am to  6pm - Pager - [phone]  After 6pm go to [email or web address] - password [email or web address]  [facility]  Office  [phone]  CC: Primary care physician; Kasandra Knudsen, NP   Note: This dictation was prepared with Dragon dictation along with smaller phrase technology. Any transcriptional errors that result from this process are unintentional.

## 2014-12-16 ENCOUNTER — Encounter: Payer: Self-pay | Admitting: Physician Assistant

## 2014-12-16 DIAGNOSIS — I319 Disease of pericardium, unspecified: Secondary | ICD-10-CM

## 2014-12-16 LAB — ANA W/REFLEX IF POSITIVE: ANA: NEGATIVE

## 2014-12-16 LAB — GLUCOSE, CAPILLARY
GLUCOSE-CAPILLARY: 237 mg/dL — AB (ref 65–99)
Glucose-Capillary: 313 mg/dL — ABNORMAL HIGH (ref 65–99)
Glucose-Capillary: 304 mg/dL — ABNORMAL HIGH (ref 65–99)
Glucose-Capillary: 276 mg/dL — ABNORMAL HIGH (ref 65–99)

## 2014-12-16 LAB — TROPONIN I
TROPONIN I: 0.05 ng/mL — AB (ref <0.031)
Troponin I: 0.05 ng/mL — ABNORMAL HIGH (ref <0.031)
Troponin I: 0.06 ng/mL — ABNORMAL HIGH (ref <0.031)

## 2014-12-16 MED ORDER — HYDRALAZINE HCL 25 MG PO TABS
25.0000 mg | ORAL_TABLET | Freq: Three times a day (TID) | ORAL | Status: DC
  Administered 2014-12-16 – 2014-12-17 (×2): 25 mg via ORAL
  Filled 2014-12-16 (×2): qty 1

## 2014-12-16 MED ORDER — SODIUM CHLORIDE 0.9 % IJ SOLN
3.0000 mL | INTRAMUSCULAR | Status: DC | PRN
  Administered 2014-12-17 – 2014-12-19 (×3): 3 mL via INTRAVENOUS
  Filled 2014-12-16 (×3): qty 10

## 2014-12-16 MED ORDER — ALBUTEROL SULFATE (2.5 MG/3ML) 0.083% IN NEBU
2.5000 mg | INHALATION_SOLUTION | Freq: Four times a day (QID) | RESPIRATORY_TRACT | Status: DC | PRN
  Administered 2014-12-16 – 2014-12-19 (×8): 2.5 mg via RESPIRATORY_TRACT
  Filled 2014-12-16 (×8): qty 3

## 2014-12-16 MED ORDER — AMLODIPINE BESYLATE 5 MG PO TABS
5.0000 mg | ORAL_TABLET | Freq: Every day | ORAL | Status: DC
  Administered 2014-12-16: 5 mg via ORAL
  Filled 2014-12-16: qty 1

## 2014-12-16 MED ORDER — ENOXAPARIN SODIUM 40 MG/0.4ML ~~LOC~~ SOLN
40.0000 mg | Freq: Two times a day (BID) | SUBCUTANEOUS | Status: DC
  Administered 2014-12-16 – 2014-12-19 (×7): 40 mg via SUBCUTANEOUS
  Filled 2014-12-16 (×7): qty 0.4

## 2014-12-16 MED ORDER — HYDRALAZINE HCL 10 MG PO TABS
10.0000 mg | ORAL_TABLET | Freq: Three times a day (TID) | ORAL | Status: DC
  Administered 2014-12-16: 10 mg via ORAL
  Filled 2014-12-16 (×4): qty 1

## 2014-12-16 MED ORDER — FUROSEMIDE 10 MG/ML IJ SOLN
20.0000 mg | Freq: Two times a day (BID) | INTRAMUSCULAR | Status: DC
  Administered 2014-12-17 – 2014-12-18 (×3): 20 mg via INTRAVENOUS
  Filled 2014-12-16 (×3): qty 2

## 2014-12-16 MED ORDER — BUDESONIDE 0.25 MG/2ML IN SUSP
0.2500 mg | Freq: Two times a day (BID) | RESPIRATORY_TRACT | Status: DC
  Administered 2014-12-16 – 2014-12-19 (×7): 0.25 mg via RESPIRATORY_TRACT
  Filled 2014-12-16 (×7): qty 2

## 2014-12-16 MED ORDER — INSULIN ASPART PROT & ASPART (70-30 MIX) 100 UNIT/ML ~~LOC~~ SUSP
30.0000 [IU] | Freq: Two times a day (BID) | SUBCUTANEOUS | Status: DC
  Administered 2014-12-16 – 2014-12-17 (×2): 30 [IU] via SUBCUTANEOUS
  Filled 2014-12-16 (×2): qty 30

## 2014-12-16 NOTE — Progress Notes (Signed)
Notified Dr. Leslye Peer of BP before scheduled meds. Rechecked BP 160/107 HR 102. MD made aware stated he has not rounded yet. Waiting on ECHO. Will continue to monitor patient.

## 2014-12-16 NOTE — Care Management (Signed)
Patient is without insurance coverage until December 27 2014- when her medicaid becomes active.  She has been without her insulin (lantus  and Novolog)  and and blood pressure medications.  made attempts to obtain scripts to [facility] to [facility] as patient admitted under observation and if stable could discharge today but after hours of the [facility].   Was not successful obtaining these scripts so the clinic is not an option for patient.  Completed a MATCH Referral that will enable patient to obtain hopefully her most important meds and insulin.  Explained to patient that this is a one time assistance.  Will have weekend CM deliver the notice with the participating pharmacies

## 2014-12-16 NOTE — Progress Notes (Signed)
Dr. Leslye Peer made aware of Troponin 0.05.

## 2014-12-16 NOTE — Progress Notes (Signed)
P.A. Christell Faith made aware of patient's previous and current BP. Stated he will make some med adjustments.

## 2014-12-16 NOTE — Plan of Care (Signed)
Problem: Phase I Progression Outcomes Goal: Hemodynamically stable Outcome: Not Progressing Elevated blood pressure

## 2014-12-16 NOTE — Progress Notes (Signed)
Patient ID: LOLISA MUSCH, female   DOB: 01-21-1971, 44 y.o.   MRN: [id number] [facility] PROGRESS NOTE PCP: Kasandra Knudsen, NP  HPI/Subjective: Patient with shortness of breath. Poor exercise capacity. Increased blood pressure and uncontrolled sugars. Weight gain of 40 pounds over the last 6 months to year with abdominal swelling and swelling in the legs.  Objective: Filed Vitals:   12/16/14 0942  BP: 160/107  Pulse: 102  Temp:   Resp:     Filed Weights   12/15/14 0848 12/15/14 1400 12/16/14 0434  Weight: 151.19 kg (333 lb 5 oz) 151.955 kg (335 lb) 149.778 kg (330 lb 3.2 oz)    ROS: Review of Systems  Constitutional: Negative for fever and chills.  Eyes: Negative for blurred vision.  Respiratory: Positive for cough and shortness of breath. Negative for sputum production.   Cardiovascular: Negative for chest pain.  Gastrointestinal: Negative for nausea, vomiting, abdominal pain, diarrhea and constipation.  Genitourinary: Negative for dysuria.  Musculoskeletal: Negative for joint pain.  Neurological: Negative for dizziness and headaches.   Exam: Physical Exam  Constitutional: She is oriented to person, place, and time.  HENT:  Nose: No mucosal edema.  Mouth/Throat: No oropharyngeal exudate or posterior oropharyngeal edema.  Eyes: Conjunctivae, EOM and lids are normal. Pupils are equal, round, and reactive to light.  Neck: JVD present. Carotid bruit is not present. No thyroid mass and no thyromegaly present.  Cardiovascular: S1 normal and S2 normal.  Exam reveals gallop and S3.   No murmur heard. Pulses:      Dorsalis pedis pulses are 2+ on the right side, and 2+ on the left side.  Respiratory: No respiratory distress. She has decreased breath sounds in the right upper field, the right middle field, the right lower field, the left upper field, the left middle field and the left lower field. She has wheezes in the right lower field and the left lower field.  She has no rhonchi. She has no rales.  GI: Soft. Bowel sounds are normal. There is no tenderness.  Musculoskeletal:       Right ankle: She exhibits swelling.       Left ankle: She exhibits swelling.  Lymphadenopathy:    She has no cervical adenopathy.  Neurological: She is alert and oriented to person, place, and time. No cranial nerve deficit.  Skin: Skin is warm. No rash noted. Nails show no clubbing.  Psychiatric: She has a normal mood and affect.    Data Reviewed: Basic Metabolic Panel:  Recent Labs Lab 12/15/14 0910  NA 135  K 3.6  CL 99*  CO2 31  GLUCOSE 213*  BUN 14  CREATININE 1.00  CALCIUM 8.0*   Liver Function Tests:  Recent Labs Lab 12/15/14 0910  AST 25  ALT 21  ALKPHOS 78  BILITOT 0.1*  PROT 5.7*  ALBUMIN 2.5*   CBC:  Recent Labs Lab 12/15/14 0910  WBC 5.9  NEUTROABS 3.7  HGB 9.0*  HCT 30.0*  MCV 70.3*  PLT 408   Cardiac Enzymes:  Recent Labs Lab 12/15/14 0910  TROPONINI 0.06*   BNP (last 3 results)  Recent Labs  12/15/14 0910  BNP 91.0   CBG:  Recent Labs Lab 12/15/14 1650 12/15/14 2118 12/16/14 0719  GLUCAP 281* 242* 313*      Studies: Dg Chest 2 View  12/15/2014  CLINICAL DATA:  Shortness of Breath EXAM: CHEST  2 VIEW COMPARISON:  11/20/2014 FINDINGS: Cardiomediastinal silhouette is stable. There is streaky right base  medially atelectasis or early infiltrate. No pulmonary edema. IMPRESSION: Streaky right base medially atelectasis or early infiltrate. No pulmonary edema. Electronically Signed   By: Lahoma Crocker M.D.   On: 12/15/2014 09:46   Ct Angio Chest Pe W/cm &/or Wo Cm  12/15/2014  CLINICAL DATA:  Severe dyspnea EXAM: CT ANGIOGRAPHY CHEST WITH CONTRAST TECHNIQUE: Multidetector CT imaging of the chest was performed using the standard protocol during bolus administration of intravenous contrast. Multiplanar CT image reconstructions and MIPs were obtained to evaluate the vascular anatomy. CONTRAST:  177mL OMNIPAQUE  IOHEXOL 350 MG/ML SOLN COMPARISON:  None. FINDINGS: THORACIC INLET/BODY WALL: No acute abnormality. MEDIASTINUM: Mild cardiomegaly with a moderate low-density pericardial effusion without serosal enhancement or nodularity. The fluid has increased from abdominal CT 10/28/2014. No straightening of the interventricular septum or free wall collapse. Scan is essentially free breathing, limiting evaluation of the pulmonary arteries. No evidence of pulmonary embolism, with sensitivity limited to the segmental level and proximal. No suspicious lymph nodes. Negative aorta. LUNG WINDOWS: Diffuse bronchial wall thickening with scattered subsegmental atelectasis. Small pleural effusions without pulmonary edema. UPPER ABDOMEN: No acute findings. OSSEOUS: No acute fracture. No suspicious lytic or blastic lesions. Thoracic dextroscoliosis. Review of the MIP images confirms the above findings. IMPRESSION: 1. CTA of the pulmonary arteries is limited by motion. No evidence of pulmonary embolism to the segmental level. 2. Bronchitis with subsegmental atelectasis. 3. Moderate pericardial effusion, increased from abdominal CT 10/28/2014, and trace pleural effusions. Electronically Signed   By: Monte Fantasia M.D.   On: 12/15/2014 11:20    Scheduled Meds: . amLODipine  5 mg Oral Daily  . aspirin EC  81 mg Oral Daily  . budesonide (PULMICORT) nebulizer solution  0.25 mg Nebulization BID  . enoxaparin (LOVENOX) injection  40 mg Subcutaneous Q12H  . furosemide  20 mg Intravenous Q12H  . hydrochlorothiazide  25 mg Oral Daily  . insulin aspart  0-15 Units Subcutaneous TID WC  . insulin aspart  0-5 Units Subcutaneous QHS  . insulin aspart protamine- aspart  15 Units Subcutaneous BID WC  . lisinopril  40 mg Oral Daily  . sodium chloride  3 mL Intravenous Q12H    Assessment/Plan:  1. Accelerated hypertension- adding medications to get better blood pressure control. Norvasc, Lasix and hydrochlorothiazide started. 2. Acute  congestive heart failure with pericardial effusion- echocardiogram still pending at this time. Continue low-dose IV Lasix. No beta blocker with bronchospasm. Continue lisinopril. 3. Morbid obesity and likely sleep apnea will need sleep study as outpatient. 4. Uncontrolled diabetes mellitus NovoLog 70/30 increased to 30 units twice a day. 5. History of asthma- not moving much air. Add budesonide nebulizers. 6. Elevated troponin- likely demand ischemia from heart failure.  Code Status:     Code Status Orders        Start     Ordered   12/15/14 1154  Full code   Continuous     12/15/14 1154     Disposition Plan: Home soon  Consultants:  Cardiology  Time spent: 25 minutes  Loletha Grayer  [facility]

## 2014-12-16 NOTE — Consult Note (Signed)
Cardiology Consultation Note  Patient ID: [name], MRN: [id number], DOB/AGE: [birth year] 44 y.o. Admit date: 12/15/2014   Date of Consult: 12/16/2014 Primary Physician: Kasandra Knudsen, NP Primary Cardiologist: New to [facility]  Chief Complaint: SOB and cough Reason for Consult: Pericardial effusion   HPI: 44 y.o. female with h/o poorly controlled diabetes, iron deficiency anemia, HTN, and asthma who has not been on any medications for the last 1 month presented to [facility] on 10/20 with cough, SOB, lower extremity edema and weight gain of 40 pounds over the past 6 months. Cardiology is consulted for moderate sized pericardial effusion seen on CTA chest. Echo is pending at this time.   She has no previously known cardiac history and has never seen a cardiologist before. She was previously seen in September 2016 for abdominal pain and was noted to have an incidental small pericardial effusion on CT renal stone study. No follow up planned at that time. She was asymptomatic. She reports previously having well controlled BP and diabetes, though over the past 1 month she had been unable to afford her medications and had to stop taking them all. During that time span her BP went into the 841'L to 244'W systolic. She reports it usually runs in the 120's/70's.   She has been experiencing increased weight gain of 40 pounds over the past 6 months. She was previously seen by outside urgent care for lower extremity swelling/cellulitis and treated with Lasix. Weight dropped from the 340's to 312 over 1 week time span. Over the past one month she has been experiencing increasing episodes of SOB with weight gain as above. There have been intermittent times of right posterior shoulder pain. No associated chest pain, nausea, vomiting, presyncope, syncope, or diaphoresis. No orthopnea, PND, or early satiety. She decided to come in for evaluation given her persisting symptoms.   Upon her arrival she was found an elevated  troponin of 0.06, not trended, d-dimer 1145, BNP 91, CTA chest negative for PE (though limited by motion), bronchitis with subsegmental atelectasis, moderate pericardial effusion that had increased from abdominal CT done on 11/07/2014, trace pleural effusions. ESR 55 mm/hr. A1C 13.2%. She was started on IV Lasix 60 mg x 2 doses. She has remained hypertensive with blood pressure running in the 102'V to 253'G systolic.    Past Medical History  Diagnosis Date  . Poorly controlled diabetes mellitus ([place])     a. 11/2014 A1c 13.2.  [name] Anemia   . Asthma   . Hypertension   . Pericardial effusion     a. 11/2014 CT Chest: Mod effusion.      Most Recent Cardiac Studies: Echo pending   Surgical History:  Past Surgical History  Procedure Laterality Date  . Cesarean section       Home Meds: Prior to Admission medications   Medication Sig Start Date End Date Taking? Authorizing Provider  albuterol (PROVENTIL HFA;VENTOLIN HFA) 108 (90 BASE) MCG/ACT inhaler Inhale 2 puffs into the lungs every 6 (six) hours as needed for wheezing or shortness of breath.   Yes Historical Provider, MD  insulin aspart (NOVOLOG) 100 UNIT/ML injection Inject 30 Units into the skin 2 (two) times daily.   Yes Historical Provider, MD  azithromycin (ZITHROMAX Z-PAK) 250 MG tablet Take 2 tablets (500 mg) on  Day 1,  followed by 1 tablet (250 mg) once daily on Days 2 through 5. Patient not taking: Reported on 12/15/2014 11/20/14   Alita Chyle Bacon Menshew, PA-C  diazepam (VALIUM) 5  MG tablet Take 1 tablet (5 mg total) by mouth every 8 (eight) hours as needed for muscle spasms. 10/28/14   Earleen Newport, MD  ibuprofen (ADVIL,MOTRIN) 800 MG tablet Take 1 tablet (800 mg total) by mouth every 8 (eight) hours as needed. 10/28/14   Earleen Newport, MD  mupirocin ointment Drue Stager) 2 % Apply to affected area 3 times daily 10/28/14 10/28/15  Earleen Newport, MD  predniSONE (STERAPRED UNI-PAK 21 TAB) 10 MG (21) TBPK tablet Take 1  tablet (10 mg total) by mouth daily. Patient not taking: Reported on 12/15/2014 11/20/14   Dannielle Karvonen Menshew, PA-C  sulfamethoxazole-trimethoprim (BACTRIM DS) 800-160 MG per tablet Take 1 tablet by mouth 2 (two) times daily. 10/28/14   Earleen Newport, MD    Inpatient Medications:  . aspirin EC  81 mg Oral Daily  . enoxaparin (LOVENOX) injection  40 mg Subcutaneous Q12H  . hydrochlorothiazide  25 mg Oral Daily  . insulin aspart  0-15 Units Subcutaneous TID WC  . insulin aspart  0-5 Units Subcutaneous QHS  . insulin aspart protamine- aspart  15 Units Subcutaneous BID WC  . lisinopril  40 mg Oral Daily  . sodium chloride  3 mL Intravenous Q12H      Allergies:  Allergies  Allergen Reactions  . Sulfa Antibiotics Itching    Social History   Social History  . Marital Status: Married    Spouse Name: N/A  . Number of Children: N/A  . Years of Education: N/A   Occupational History  . Not on file.   Social History Main Topics  . Smoking status: Never Smoker   . Smokeless tobacco: Not on file  . Alcohol Use: Yes  . Drug Use: No  . Sexual Activity: Not on file   Other Topics Concern  . Not on file   Social History Narrative     Family History  Problem Relation Age of Onset  . Hypertension    . Diabetes       Review of Systems: Review of Systems  Constitutional: Positive for malaise/fatigue. Negative for fever, chills, weight loss and diaphoresis.  HENT: Positive for congestion.   Eyes: Negative for discharge and redness.  Respiratory: Positive for cough and shortness of breath. Negative for hemoptysis, sputum production and wheezing.   Cardiovascular: Positive for leg swelling. Negative for chest pain, palpitations, orthopnea, claudication and PND.  Gastrointestinal: Negative for heartburn, nausea, vomiting and abdominal pain.  Musculoskeletal: Negative for falls.  Skin: Negative for rash.  Neurological: Positive for weakness. Negative for dizziness, tingling,  tremors, sensory change, speech change, focal weakness and loss of consciousness.  Endo/Heme/Allergies: Does not bruise/bleed easily.  Psychiatric/Behavioral: Positive for substance abuse. The patient is not nervous/anxious.        History of marijuana abuse  All other systems reviewed and are negative.    Labs:  Recent Labs  12/15/14 0910  TROPONINI 0.06*   Lab Results  Component Value Date   WBC 5.9 12/15/2014   HGB 9.0* 12/15/2014   HCT 30.0* 12/15/2014   MCV 70.3* 12/15/2014   PLT 408 12/15/2014    Recent Labs Lab 12/15/14 0910  NA 135  K 3.6  CL 99*  CO2 31  BUN 14  CREATININE 1.00  CALCIUM 8.0*  PROT 5.7*  BILITOT 0.1*  ALKPHOS 78  ALT 21  AST 25  GLUCOSE 213*   No results found for: CHOL, HDL, LDLCALC, TRIG No results found for: DDIMER  Radiology/Studies:  Dg  Chest 2 View  12/15/2014  CLINICAL DATA:  Shortness of Breath EXAM: CHEST  2 VIEW COMPARISON:  11/20/2014 FINDINGS: Cardiomediastinal silhouette is stable. There is streaky right base medially atelectasis or early infiltrate. No pulmonary edema. IMPRESSION: Streaky right base medially atelectasis or early infiltrate. No pulmonary edema. Electronically Signed   By: Lahoma Crocker M.D.   On: 12/15/2014 09:46   Dg Chest 2 View  11/20/2014  CLINICAL DATA:  Cough, body aches and chills today. History of asthma. EXAM: CHEST  2 VIEW COMPARISON:  PA and lateral chest 02/05/2011 and 12/21/2008. FINDINGS: There is cardiomegaly without edema. No pneumothorax or pleural effusion. No focal bony abnormality. IMPRESSION: Cardiomegaly without acute disease. Electronically Signed   By: Inge Rise M.D.   On: 11/20/2014 15:27   Ct Angio Chest Pe W/cm &/or Wo Cm  12/15/2014  CLINICAL DATA:  Severe dyspnea EXAM: CT ANGIOGRAPHY CHEST WITH CONTRAST TECHNIQUE: Multidetector CT imaging of the chest was performed using the standard protocol during bolus administration of intravenous contrast. Multiplanar CT image  reconstructions and MIPs were obtained to evaluate the vascular anatomy. CONTRAST:  163m OMNIPAQUE IOHEXOL 350 MG/ML SOLN COMPARISON:  None. FINDINGS: THORACIC INLET/BODY WALL: No acute abnormality. MEDIASTINUM: Mild cardiomegaly with a moderate low-density pericardial effusion without serosal enhancement or nodularity. The fluid has increased from abdominal CT 10/28/2014. No straightening of the interventricular septum or free wall collapse. Scan is essentially free breathing, limiting evaluation of the pulmonary arteries. No evidence of pulmonary embolism, with sensitivity limited to the segmental level and proximal. No suspicious lymph nodes. Negative aorta. LUNG WINDOWS: Diffuse bronchial wall thickening with scattered subsegmental atelectasis. Small pleural effusions without pulmonary edema. UPPER ABDOMEN: No acute findings. OSSEOUS: No acute fracture. No suspicious lytic or blastic lesions. Thoracic dextroscoliosis. Review of the MIP images confirms the above findings. IMPRESSION: 1. CTA of the pulmonary arteries is limited by motion. No evidence of pulmonary embolism to the segmental level. 2. Bronchitis with subsegmental atelectasis. 3. Moderate pericardial effusion, increased from abdominal CT 10/28/2014, and trace pleural effusions. Electronically Signed   By: JMonte FantasiaM.D.   On: 12/15/2014 11:20    EKG: NSR, 94 bpm, prolonged QTc 470 msec, no significant st/t changes  Weights: Filed Weights   12/15/14 0848 12/15/14 1400 12/16/14 0434  Weight: 333 lb 5 oz (151.19 kg) 335 lb (151.955 kg) 330 lb 3.2 oz (149.778 kg)     Physical Exam: Blood pressure 160/107, pulse 102, temperature 98 F (36.7 C), temperature source Oral, resp. rate 16, height 5' 4"  (1.626 m), weight 330 lb 3.2 oz (149.778 kg), last menstrual period 12/15/2014, SpO2 94 %. Body mass index is 56.65 kg/(m^2). General: Well developed, well nourished, in no acute distress. Head: Normocephalic, atraumatic, sclera non-icteric,  no xanthomas, nares are without discharge.  Neck: Negative for carotid bruits. JVD not elevated. Lungs: Coarse breath sounds bilaterally. Breathing is unlabored. Heart: RRR with S1 S2. No murmurs, rubs, or gallops appreciated. Abdomen: Obese, soft, non-tender, non-distended with normoactive bowel sounds. No hepatomegaly. No rebound/guarding. No obvious abdominal masses. Msk:  Strength and tone appear normal for age. Extremities: No clubbing or cyanosis. 1+ non-pitting edema bilateral lower extremities.  Distal pedal pulses are 2+ and equal bilaterally. Neuro: Alert and oriented X 3. No facial asymmetry. No focal deficit. Moves all extremities spontaneously. Psych:  Responds to questions appropriately with a normal affect.    Assessment and Plan:  44y.o. female with h/o poorly controlled diabetes, iron deficiency anemia, HTN, and asthma who  has not been on any medications for the last 1 month presented to [facility] on 10/20 with cough, SOB, lower extremity edema and weight gain of 40 pounds over the past 6 months. Cardiology is consulted for moderate sized pericardial effusion seen on CTA chest. Echo is pending at this time.   1. Pericardial effusion: -Echo is pending to evaluate pericardial effusion, and LV function  -IV Lasix 20 mg q 12 hours -Uncertain underlying etiology at this time, possibly in the setting poor medication compliance and accelerated HTN vs infection  -Further work up pending echo  2. Bronchitis/early infiltrate: -Consider ABX -Continue nebs per IM  3. Accelerated HTN: -Add Norvasc 5 mg daily and IV Lasix 20 mg q 12 hours -Continue current medications -She reports her blood pressure was well controlled up until 1 month ago  4. Elevated troponin: -Likely supply demand ischemia in the setting of the above -Will continue to cycle and ensure troponin is not up trending   5. DM2: -SSI per IM -Poorly controlled  6. Iron deficiency anemia -Stable -May be playing a role  in her SOB as well  7. Asthma: -Continue current treatment    Signed, Christell Faith, PA-C Pager: [phone] 12/16/2014, 10:23 AM

## 2014-12-16 NOTE — Progress Notes (Signed)
Filed Vitals:   12/16/14 1439  BP: 169/90  Pulse: 98  Temp:   Resp:    VS after PRN hydralazine 10mg  IV

## 2014-12-16 NOTE — Progress Notes (Addendum)
Inpatient Diabetes Program Recommendations  AACE/ADA: New Consensus Statement on Inpatient Glycemic Control (2015)  Target Ranges:  Prepandial:   less than 140 mg/dL      Peak postprandial:   less than 180 mg/dL (1-2 hours)      Critically ill patients:  140 - 180 mg/dL   Review of Glycemic Control  Diabetes history: Type 2 Outpatient Diabetes medications: Novolog 30 units bid- not taking, taking Novolog mix 70/30 30 units bid- has 1 dose left Current orders for Inpatient glycemic control: Novolog 70/30 mix 15 units bid, Novolog 0-15 units tid, Novolog 0-5 units qhs  Inpatient Diabetes Program Recommendations: Met with the patient at the bedside.  She had Medicaid which ran out.  She switched to an insurance she paid for but  she lost because she could not afford.  She tells me she has a letter at home saying she will begin Medicaid on November 1st.  She has been on Lantus and novolog and Lantus in the past but was switched to Novolog 70/30- she has one dose left at home.  She uses insulin pens.  I have spoke to the Medication Management clinic.  They have one box of Humalog pens and a box of Lantus insulin pens available now.  They do not have any Novolog mix insulin pens.  Please consider ordering both Lantus and Humalog pens now for discharge- someone from her family can pick them up for her today. Orders for the doses can be determined on discharge.  Consider increasing Novolog 70/30 insulin to 30 units bid. Continue Novolog correction as ordered.   Patient has had diabetes education in the past at the [facility].   Gentry Fitz, RN, BA, MHA, CDE Diabetes Coordinator Inpatient Diabetes Program  [phone] (Team Pager) [phone] ([place]) 12/16/2014 10:38 AM

## 2014-12-17 DIAGNOSIS — I5031 Acute diastolic (congestive) heart failure: Secondary | ICD-10-CM | POA: Diagnosis present

## 2014-12-17 LAB — IRON AND TIBC
Iron: 12 ug/dL — ABNORMAL LOW (ref 28–170)
SATURATION RATIOS: 4 % — AB (ref 10.4–31.8)
TIBC: 319 ug/dL (ref 250–450)
UIBC: 307 ug/dL

## 2014-12-17 LAB — BASIC METABOLIC PANEL
Anion gap: 5 (ref 5–15)
BUN: 18 mg/dL (ref 6–20)
CO2: 32 mmol/L (ref 22–32)
Calcium: 8.4 mg/dL — ABNORMAL LOW (ref 8.9–10.3)
Chloride: 96 mmol/L — ABNORMAL LOW (ref 101–111)
Creatinine, Ser: 1.07 mg/dL — ABNORMAL HIGH (ref 0.44–1.00)
GFR calc Af Amer: >60 mL/min (ref >60)
GLUCOSE: 346 mg/dL — AB (ref 65–99)
POTASSIUM: 3.8 mmol/L (ref 3.5–5.1)
Sodium: 133 mmol/L — ABNORMAL LOW (ref 135–145)

## 2014-12-17 LAB — GLUCOSE, CAPILLARY
GLUCOSE-CAPILLARY: 261 mg/dL — AB (ref 65–99)
Glucose-Capillary: 337 mg/dL — ABNORMAL HIGH (ref 65–99)

## 2014-12-17 LAB — FERRITIN: Ferritin: 7 ng/mL — ABNORMAL LOW (ref 11–307)

## 2014-12-17 MED ORDER — INSULIN ASPART PROT & ASPART (70-30 MIX) 100 UNIT/ML ~~LOC~~ SUSP
40.0000 [IU] | Freq: Two times a day (BID) | SUBCUTANEOUS | Status: DC
  Administered 2014-12-17 – 2014-12-18 (×2): 40 [IU] via SUBCUTANEOUS
  Filled 2014-12-17 (×2): qty 40

## 2014-12-17 MED ORDER — AMLODIPINE BESYLATE 10 MG PO TABS
10.0000 mg | ORAL_TABLET | Freq: Every day | ORAL | Status: DC
  Administered 2014-12-17 – 2014-12-19 (×3): 10 mg via ORAL
  Filled 2014-12-17 (×3): qty 1

## 2014-12-17 MED ORDER — POTASSIUM CHLORIDE CRYS ER 10 MEQ PO TBCR
10.0000 meq | EXTENDED_RELEASE_TABLET | Freq: Two times a day (BID) | ORAL | Status: DC
  Administered 2014-12-17 – 2014-12-19 (×5): 10 meq via ORAL
  Filled 2014-12-17 (×5): qty 1

## 2014-12-17 MED ORDER — HYDRALAZINE HCL 50 MG PO TABS
50.0000 mg | ORAL_TABLET | Freq: Three times a day (TID) | ORAL | Status: DC
  Administered 2014-12-17 – 2014-12-18 (×3): 50 mg via ORAL
  Filled 2014-12-17 (×3): qty 1

## 2014-12-17 NOTE — Progress Notes (Signed)
Patient ID: [name], female   DOB: 02-01-1971, 44 y.o.   MRN: [id number] [facility] PROGRESS NOTE PCP: Kasandra Knudsen, NP  HPI/Subjective: [name] breathing is improved, states that swelling in the lower extremities decreased as well denies any chest pain Has some cough.     Objective: Filed Vitals:   12/17/14 1202  BP: 169/79  Pulse: 98  Temp: 98.7 F (37.1 C)  Resp: 19    Filed Weights   12/15/14 1400 12/16/14 0434 12/17/14 0500  Weight: 151.955 kg (335 lb) 149.778 kg (330 lb 3.2 oz) 148.054 kg (326 lb 6.4 oz)    ROS: Review of Systems  Constitutional: Negative for fever and chills.  Eyes: Negative for blurred vision.  Respiratory: Positive for cough and shortness of breath. Negative for sputum production.   Cardiovascular: Negative for chest pain.  Gastrointestinal: Negative for nausea, vomiting, abdominal pain, diarrhea and constipation.  Genitourinary: Negative for dysuria.  Musculoskeletal: Negative for joint pain.  Neurological: Negative for dizziness and headaches.   Exam: Physical Exam  Constitutional: She is oriented to person, place, and time.  HENT:  Nose: No mucosal edema.  Mouth/Throat: No oropharyngeal exudate or posterior oropharyngeal edema.  Eyes: Conjunctivae, EOM and lids are normal. Pupils are equal, round, and reactive to light.  Neck: JVD present. Carotid bruit is not present. No thyroid mass and no thyromegaly present.  Cardiovascular: S1 normal and S2 normal.  Exam reveals gallop and S3.   No murmur heard. Pulses:      Dorsalis pedis pulses are 2+ on the right side, and 2+ on the left side.  Respiratory: No respiratory distress.   GI: Soft. Bowel sounds are normal. There is no tenderness.  Musculoskeletal:       Right ankle: She exhibits swelling.       Left ankle: She exhibits swelling.  Lymphadenopathy:    She has no cervical adenopathy.  Neurological: She is alert and oriented to person, place, and time. No  cranial nerve deficit.  Skin: Skin is warm. No rash noted. Nails show no clubbing.  Psychiatric: She has a normal mood and affect.    Data Reviewed: Basic Metabolic Panel:  Recent Labs Lab 12/15/14 0910 12/17/14 0517  NA 135 133*  K 3.6 3.8  CL 99* 96*  CO2 31 32  GLUCOSE 213* 346*  BUN 14 18  CREATININE 1.00 1.07*  CALCIUM 8.0* 8.4*   Liver Function Tests:  Recent Labs Lab 12/15/14 0910  AST 25  ALT 21  ALKPHOS 78  BILITOT 0.1*  PROT 5.7*  ALBUMIN 2.5*   CBC:  Recent Labs Lab 12/15/14 0910  WBC 5.9  NEUTROABS 3.7  HGB 9.0*  HCT 30.0*  MCV 70.3*  PLT 408   Cardiac Enzymes:  Recent Labs Lab 12/15/14 0910 12/16/14 1217 12/16/14 1712 12/16/14 2236  TROPONINI 0.06* 0.05* 0.05* 0.06*   BNP (last 3 results)  Recent Labs  12/15/14 0910  BNP 91.0   CBG:  Recent Labs Lab 12/16/14 1136 12/16/14 1642 12/16/14 2028 12/17/14 0737 12/17/14 1201  GLUCAP 304* 237* 276* 337* 261*      Studies: No results found.  Scheduled Meds: . amLODipine  10 mg Oral Daily  . aspirin EC  81 mg Oral Daily  . budesonide (PULMICORT) nebulizer solution  0.25 mg Nebulization BID  . enoxaparin (LOVENOX) injection  40 mg Subcutaneous Q12H  . furosemide  20 mg Intravenous Q12H  . hydrALAZINE  50 mg Oral 3 times per day  .  hydrochlorothiazide  25 mg Oral Daily  . insulin aspart  0-15 Units Subcutaneous TID WC  . insulin aspart  0-5 Units Subcutaneous QHS  . insulin aspart protamine- aspart  30 Units Subcutaneous BID WC  . lisinopril  40 mg Oral Daily  . potassium chloride  10 mEq Oral BID  . sodium chloride  3 mL Intravenous Q12H    Assessment/Plan:  1. Accelerated hypertension- continue Norvasc, increase hydralazine to 50 mg 3 times a day, continue HCTZ and lisinopril. 2. Acute diastolic congestive heart failure exasperation with pericardial effusion- continue diuresis patient's symptoms improving . 3. Morbid obesity and likely sleep apnea will need sleep  study as outpatient. 4. Uncontrolled diabetes mellitus NovoLog 70/30 increase increase to 40 units twice a day 5. History of asthma-continue nebulizers. 6. Elevated troponin- likely demand ischemia from heart failure.  Code Status:     Code Status Orders        Start     Ordered   12/15/14 1154  Full code   Continuous     12/15/14 1154     Disposition Plan: Home soon  Consultants:  Cardiology  Time spent: 25 minutes  [place], [facility]

## 2014-12-17 NOTE — Progress Notes (Signed)
Patient: [name] / Admit Date: 12/15/2014 / Date of Encounter: 12/17/2014, 8:16 AM   Subjective: Feeling a little bit better this morning. Less SOB. Slept better. She has diuresed 9147 mL for the admission thus far. Echo showed EF 50-55%, no RWMA, GR1DD, left atrium mildly dilated, small to moderate pericardial effusion circumferential to the heart. There was no evidence of hemodynamic compromise.   Review of Systems: Review of Systems  Constitutional: Positive for malaise/fatigue. Negative for fever, chills, weight loss and diaphoresis.  HENT: Negative for congestion.   Eyes: Negative for discharge and redness.  Respiratory: Positive for cough and shortness of breath. Negative for hemoptysis, sputum production and wheezing.   Cardiovascular: Positive for leg swelling. Negative for chest pain, palpitations, orthopnea, claudication and PND.  Gastrointestinal: Negative for nausea, vomiting and abdominal pain.  Musculoskeletal: Negative for falls.  Skin: Negative for rash.  Neurological: Positive for weakness. Negative for dizziness, tingling, tremors, sensory change, speech change, focal weakness and loss of consciousness.  Endo/Heme/Allergies: Does not bruise/bleed easily.  Psychiatric/Behavioral: Negative for substance abuse. The patient is not nervous/anxious.      Objective: Telemetry: sinus tachycardia, 101 bpm Physical Exam: Blood pressure 155/95, pulse 102, temperature 98.4 F (36.9 C), temperature source Oral, resp. rate 18, height 5\' 4"  (1.626 m), weight 326 lb 6.4 oz (148.054 kg), last menstrual period 12/15/2014, SpO2 92 %. Body mass index is 56 kg/(m^2). General: Well developed, well nourished, in no acute distress. Head: Normocephalic, atraumatic, sclera non-icteric, no xanthomas, nares are without discharge. Neck: Negative for carotid bruits. JVP not elevated. Lungs: Decreased breath sounds at the bilateral bases. Breathing is unlabored. Heart: RRR S1 S2 without  murmurs, rubs, or gallops.  Abdomen: Obese, soft, non-tender, non-distended with normoactive bowel sounds. No rebound/guarding. Extremities: No clubbing or cyanosis. 1+ pitting edema to the bilateral thighs. Distal pedal pulses are 2+ and equal bilaterally. Neuro: Alert and oriented X 3. Moves all extremities spontaneously. Psych:  Responds to questions appropriately with a normal affect.   Intake/Output Summary (Last 24 hours) at 12/17/14 0816 Last data filed at 12/17/14 0645  Gross per 24 hour  Intake    363 ml  Output   5800 ml  Net  -5437 ml    Inpatient Medications:  . amLODipine  5 mg Oral Daily  . aspirin EC  81 mg Oral Daily  . budesonide (PULMICORT) nebulizer solution  0.25 mg Nebulization BID  . enoxaparin (LOVENOX) injection  40 mg Subcutaneous Q12H  . furosemide  20 mg Intravenous Q12H  . hydrALAZINE  25 mg Oral 3 times per day  . hydrochlorothiazide  25 mg Oral Daily  . insulin aspart  0-15 Units Subcutaneous TID WC  . insulin aspart  0-5 Units Subcutaneous QHS  . insulin aspart protamine- aspart  30 Units Subcutaneous BID WC  . lisinopril  40 mg Oral Daily  . sodium chloride  3 mL Intravenous Q12H   Infusions:    Labs:  Recent Labs  12/15/14 0910 12/17/14 0517  NA 135 133*  K 3.6 3.8  CL 99* 96*  CO2 31 32  GLUCOSE 213* 346*  BUN 14 18  CREATININE 1.00 1.07*  CALCIUM 8.0* 8.4*    Recent Labs  12/15/14 0910  AST 25  ALT 21  ALKPHOS 78  BILITOT 0.1*  PROT 5.7*  ALBUMIN 2.5*    Recent Labs  12/15/14 0910  WBC 5.9  NEUTROABS 3.7  HGB 9.0*  HCT 30.0*  MCV 70.3*  PLT  [place]  12/15/14 0910 12/16/14 1217 12/16/14 1712 12/16/14 2236  TROPONINI 0.06* 0.05* 0.05* 0.06*   Invalid input(s): POCBNP  Recent Labs  12/15/14 0910  HGBA1C 13.2*     Weights: Filed Weights   12/15/14 1400 12/16/14 0434 12/17/14 0500  Weight: 335 lb (151.955 kg) 330 lb 3.2 oz (149.778 kg) 326 lb 6.4 oz (148.054 kg)     Radiology/Studies:    Dg Chest 2 View  12/15/2014  CLINICAL DATA:  Shortness of Breath EXAM: CHEST  2 VIEW COMPARISON:  11/20/2014 FINDINGS: Cardiomediastinal silhouette is stable. There is streaky right base medially atelectasis or early infiltrate. No pulmonary edema. IMPRESSION: Streaky right base medially atelectasis or early infiltrate. No pulmonary edema. Electronically Signed   By: Lahoma Crocker M.D.   On: 12/15/2014 09:46   Dg Chest 2 View  11/20/2014  CLINICAL DATA:  Cough, body aches and chills today. History of asthma. EXAM: CHEST  2 VIEW COMPARISON:  PA and lateral chest 02/05/2011 and 12/21/2008. FINDINGS: There is cardiomegaly without edema. No pneumothorax or pleural effusion. No focal bony abnormality. IMPRESSION: Cardiomegaly without acute disease. Electronically Signed   By: Inge Rise M.D.   On: 11/20/2014 15:27   Ct Angio Chest Pe W/cm &/or Wo Cm  12/15/2014  CLINICAL DATA:  Severe dyspnea EXAM: CT ANGIOGRAPHY CHEST WITH CONTRAST TECHNIQUE: Multidetector CT imaging of the chest was performed using the standard protocol during bolus administration of intravenous contrast. Multiplanar CT image reconstructions and MIPs were obtained to evaluate the vascular anatomy. CONTRAST:  154mL OMNIPAQUE IOHEXOL 350 MG/ML SOLN COMPARISON:  None. FINDINGS: THORACIC INLET/BODY WALL: No acute abnormality. MEDIASTINUM: Mild cardiomegaly with a moderate low-density pericardial effusion without serosal enhancement or nodularity. The fluid has increased from abdominal CT 10/28/2014. No straightening of the interventricular septum or free wall collapse. Scan is essentially free breathing, limiting evaluation of the pulmonary arteries. No evidence of pulmonary embolism, with sensitivity limited to the segmental level and proximal. No suspicious lymph nodes. Negative aorta. LUNG WINDOWS: Diffuse bronchial wall thickening with scattered subsegmental atelectasis. Small pleural effusions without pulmonary edema. UPPER ABDOMEN: No  acute findings. OSSEOUS: No acute fracture. No suspicious lytic or blastic lesions. Thoracic dextroscoliosis. Review of the MIP images confirms the above findings. IMPRESSION: 1. CTA of the pulmonary arteries is limited by motion. No evidence of pulmonary embolism to the segmental level. 2. Bronchitis with subsegmental atelectasis. 3. Moderate pericardial effusion, increased from abdominal CT 10/28/2014, and trace pleural effusions. Electronically Signed   By: Monte Fantasia M.D.   On: 12/15/2014 11:20     Assessment and Plan  44 y.o. female with h/o poorly controlled diabetes, iron deficiency anemia, HTN, and asthma who has not been on any medications for the last 1 month presented to [facility] on 10/20 with cough, SOB, lower extremity edema and weight gain of 40 pounds over the past 6 months. Cardiology is consulted for moderate sized pericardial effusion seen on CTA chest. Echo is pending at this time.   1. Small to moderate circumferential pericardial effusion without evidence of hemodynamic compromise: -Echo with EF 50-55%, no RWMA, GR1DD, mildly dilated left atrium, small to moderate pericardial effusion that was circumferential to the heart without evidence of hemodynamic compromise -Continue IV Lasix 20 mg q 12 hours -Add KCl 10 meq bid for potassium repletion  -Uncertain underlying etiology at this time, possibly in the setting poor medication compliance and accelerated HTN vs infection  -Further work up pending echo  2.  Hypertensive heart disease: -BP slightly improved this morning  -Increase Norvasc to 10 mg daily -Consider adding low dose beta blocker  -Continue IV Lasix as above -Continue hydralazine 25 mg q 8 hours, lisinopril 40 mg daily, and HCTZ 25 mg daily   3. Bronchitis/early infiltrate: -Consider ABX -Continue nebs per IM  4. Elevated troponin: -Flat trending, likely supply demand ischemia in the setting of the above -Consider outpatient nuclear stress testing to evaluate  for high risk ischemia   5. DM2: -SSI per IM -Poorly controlled  6. Iron deficiency anemia -Stable -May be playing a role in her SOB as well -Notes pica   7. Asthma: -Continue current treatment    Signed, Christell Faith, PA-C Pager: [phone] 12/17/2014, 8:16 AM

## 2014-12-18 DIAGNOSIS — R7989 Other specified abnormal findings of blood chemistry: Secondary | ICD-10-CM | POA: Diagnosis present

## 2014-12-18 DIAGNOSIS — I110 Hypertensive heart disease with heart failure: Secondary | ICD-10-CM | POA: Diagnosis present

## 2014-12-18 DIAGNOSIS — R778 Other specified abnormalities of plasma proteins: Secondary | ICD-10-CM | POA: Diagnosis present

## 2014-12-18 LAB — GLUCOSE, CAPILLARY
GLUCOSE-CAPILLARY: 353 mg/dL — AB (ref 65–99)
Glucose-Capillary: 369 mg/dL — ABNORMAL HIGH (ref 65–99)
Glucose-Capillary: 262 mg/dL — ABNORMAL HIGH (ref 65–99)
Glucose-Capillary: 198 mg/dL — ABNORMAL HIGH (ref 65–99)

## 2014-12-18 MED ORDER — CARVEDILOL 6.25 MG PO TABS: 6.2500 mg | ORAL_TABLET | Freq: Two times a day (BID) | ORAL | Status: DC

## 2014-12-18 MED ORDER — METOPROLOL TARTRATE 50 MG PO TABS
50.0000 mg | ORAL_TABLET | Freq: Two times a day (BID) | ORAL | Status: DC
  Administered 2014-12-18 – 2014-12-19 (×3): 50 mg via ORAL
  Filled 2014-12-18 (×3): qty 1

## 2014-12-18 MED ORDER — BENZONATATE 100 MG PO CAPS
200.0000 mg | ORAL_CAPSULE | Freq: Two times a day (BID) | ORAL | Status: DC | PRN
  Administered 2014-12-18 – 2014-12-19 (×2): 200 mg via ORAL
  Filled 2014-12-18 (×2): qty 2

## 2014-12-18 MED ORDER — HYDRALAZINE HCL 50 MG PO TABS
100.0000 mg | ORAL_TABLET | Freq: Three times a day (TID) | ORAL | Status: DC
  Administered 2014-12-18 – 2014-12-19 (×3): 100 mg via ORAL
  Filled 2014-12-18 (×3): qty 2

## 2014-12-18 MED ORDER — FUROSEMIDE 10 MG/ML IJ SOLN
20.0000 mg | Freq: Two times a day (BID) | INTRAMUSCULAR | Status: DC
  Administered 2014-12-18 – 2014-12-19 (×2): 20 mg via INTRAVENOUS
  Filled 2014-12-18 (×2): qty 2

## 2014-12-18 MED ORDER — INSULIN ASPART PROT & ASPART (70-30 MIX) 100 UNIT/ML ~~LOC~~ SUSP
55.0000 [IU] | Freq: Two times a day (BID) | SUBCUTANEOUS | Status: DC
  Administered 2014-12-19: 55 [IU] via SUBCUTANEOUS
  Filled 2014-12-18: qty 55

## 2014-12-18 MED ORDER — METOPROLOL TARTRATE 50 MG PO TABS: 50.0000 mg | ORAL_TABLET | Freq: Two times a day (BID) | ORAL | Status: DC

## 2014-12-18 NOTE — Progress Notes (Signed)
Patient: [name] / Admit Date: 12/15/2014 / Date of Encounter: 12/18/2014, 9:05 AM   Subjective: Feeling a little bit better this morning. Less SOB. Slept better. She has diuresed ~13.3L for the admission thus far.  Echo showed EF 50-55%, no RWMA, GR1DD, left atrium mildly dilated, small to moderate pericardial effusion circumferential to the heart. There was no evidence of hemodynamic compromise.   Review of Systems: Review of Systems  Constitutional: Positive for malaise/fatigue. Negative for fever, chills, weight loss and diaphoresis.  HENT: Negative for congestion.   Eyes: Negative for discharge and redness.  Respiratory: Positive for cough (less often & less productive) and shortness of breath (Improved). Negative for hemoptysis, sputum production and wheezing.   Cardiovascular: Positive for leg swelling. Negative for chest pain, palpitations, orthopnea, claudication and PND.  Gastrointestinal: Negative for nausea, vomiting and abdominal pain.  Musculoskeletal: Negative for falls.  Skin: Negative for rash.  Neurological: Positive for weakness. Negative for dizziness, tingling, tremors, sensory change, speech change, focal weakness and loss of consciousness.  Endo/Heme/Allergies: Does not bruise/bleed easily.  Psychiatric/Behavioral: Negative for substance abuse. The patient is not nervous/anxious.   All other systems reviewed and are negative.    Objective: Telemetry: sinus tachycardia, 101 bpm Physical Exam: Blood pressure 155/99, pulse 87, temperature 97 F (36.1 C), temperature source Oral, resp. rate 20, height 5\' 4"  (1.626 m), weight 326 lb 1 oz (147.901 kg), last menstrual period 12/15/2014, SpO2 98 %. Body mass index is 55.94 kg/(m^2). General: Well developed, well nourished, in no acute distress. Head: Normocephalic, atraumatic, sclera non-icteric, no xanthomas, nares are without discharge. Neck: Negative for carotid bruits. JVP not elevated. Lungs: Decreased  breath sounds at the bilateral bases. Breathing is unlabored. Heart: RRR S1 S2 without murmurs, rubs, or gallops.  Abdomen: Obese, soft, non-tender, non-distended with normoactive bowel sounds. No rebound/guarding. Extremities: No clubbing or cyanosis. 1+ pitting edema to the bilateral thighs. Distal pedal pulses are 2+ and equal bilaterally. Neuro: Alert and oriented X 3. Moves all extremities spontaneously. Psych:  Responds to questions appropriately with a normal affect.   Intake/Output Summary (Last 24 hours) at 12/18/14 0905 Last data filed at 12/18/14 0859  Gross per 24 hour  Intake      0 ml  Output   3300 ml  Net  -3300 ml    Inpatient Medications:  . amLODipine  10 mg Oral Daily  . aspirin EC  81 mg Oral Daily  . budesonide (PULMICORT) nebulizer solution  0.25 mg Nebulization BID  . enoxaparin (LOVENOX) injection  40 mg Subcutaneous Q12H  . furosemide  20 mg Intravenous BID  . hydrALAZINE  50 mg Oral 3 times per day  . insulin aspart  0-15 Units Subcutaneous TID WC  . insulin aspart  0-5 Units Subcutaneous QHS  . insulin aspart protamine- aspart  40 Units Subcutaneous BID WC  . lisinopril  40 mg Oral Daily  . metoprolol tartrate  50 mg Oral BID  . potassium chloride  10 mEq Oral BID  . sodium chloride  3 mL Intravenous Q12H   Infusions:    Labs:  Recent Labs  12/15/14 0910 12/17/14 0517  NA 135 133*  K 3.6 3.8  CL 99* 96*  CO2 31 32  GLUCOSE 213* 346*  BUN 14 18  CREATININE 1.00 1.07*  CALCIUM 8.0* 8.4*    Recent Labs  12/15/14 0910  AST 25  ALT 21  ALKPHOS 78  BILITOT 0.1*  PROT 5.7*  ALBUMIN 2.5*  Recent Labs  12/15/14 0910  WBC 5.9  NEUTROABS 3.7  HGB 9.0*  HCT 30.0*  MCV 70.3*  PLT 408    Recent Labs  12/15/14 0910 12/16/14 1217 12/16/14 1712 12/16/14 2236  TROPONINI 0.06* 0.05* 0.05* 0.06*   Invalid input(s): POCBNP  Recent Labs  12/15/14 0910  HGBA1C 13.2*     Weights: Filed Weights   12/16/14 0434 12/17/14 0500  12/18/14 0500  Weight: 330 lb 3.2 oz (149.778 kg) 326 lb 6.4 oz (148.054 kg) 326 lb 1 oz (147.901 kg)   Radiology/Studies:  No new studies    Assessment and Plan  44 y.o. female with h/o poorly controlled diabetes, iron deficiency anemia, HTN, and asthma who has not been on any medications for the last 1 month presented to [facility] on 10/20 with cough, SOB, lower extremity edema and weight gain of 40 pounds over the past 6 months. Cardiology is consulted for moderate sized pericardial effusion seen on CTA chest. Echo is pending at this time.   Principal Problem:   Accelerated hypertension with diastolic congestive heart failure, NYHA class 3 (HCC) Active Problems:   Acute diastolic heart failure (HCC)   Hypertensive heart disease with congestive heart failure (HCC)   Elevated troponin   Pericardial effusion   SOB (shortness of breath)   Diabetes ([place])  Principal Problem: 1. Accelerated hypertension with diastolic congestive heart failure, NYHA class 3 ([place]) . (Acute diastolic heart failure ([place]))  Continues to diurese well - is getting close to the 40 Lb that she gained - can likely convert to PO Lasix later today or in AM.  2. Hypertensive Heart Disease  Amlodpine increased to 10 mg yesterday along with Lisinopril @ max dose & Hydralazine also increased to 50 mg tid  D/c HCTZ since she is now on Lasix - Follow Potassium levels  Add BB - agree with B1 Selective metoprolol with ? Asthma.  3. Pericardial effusion - small, not physiologically concerning.  4. Elevated Troponin - due to #1  Consider outpatient nuclear stress testing to evaluate for high risk ischemia (high likelihood of Breast Attenuation).  5. SOB (shortness of breath) - due to HTN-DHF (HFPEF)  6. Diabetes ([place]) - per IM service 7. Bronchitis - per IM service 8. Fe Deficiency Anemia - per IM service.    Leonie Man, M.D., M.S.  [employer]  [address], [place]  [address] [phone] Fax [phone]   12/18/2014, 9:05 AM

## 2014-12-18 NOTE — Progress Notes (Signed)
Patient ID: [name], female   DOB: Jul 30, 1970, 44 y.o.   MRN: [id number] [facility] PROGRESS NOTE PCP: Kasandra Knudsen, NP  HPI/Subjective: [name] breathing continues to improve blood pressure still elevated blood sugar still elevated  Objective: Filed Vitals:   12/18/14 1122  BP: 159/87  Pulse: 87  Temp: 97.7 F (36.5 C)  Resp: 20    Filed Weights   12/16/14 0434 12/17/14 0500 12/18/14 0500  Weight: 149.778 kg (330 lb 3.2 oz) 148.054 kg (326 lb 6.4 oz) 147.901 kg (326 lb 1 oz)    ROS: Review of Systems  Constitutional: Negative for fever and chills.  Eyes: Negative for blurred vision.  Respiratory: Positive for cough and shortness of breath. Negative for sputum production.   Cardiovascular: Negative for chest pain.  Gastrointestinal: Negative for nausea, vomiting, abdominal pain, diarrhea and constipation.  Genitourinary: Negative for dysuria.  Musculoskeletal: Negative for joint pain.  Neurological: Negative for dizziness and headaches.   Exam: Physical Exam  Constitutional: She is oriented to person, place, and time.  HENT:  Nose: No mucosal edema.  Mouth/Throat: No oropharyngeal exudate or posterior oropharyngeal edema.  Eyes: Conjunctivae, EOM and lids are normal. Pupils are equal, round, and reactive to light.  Neck: JVD present. Carotid bruit is not present. No thyroid mass and no thyromegaly present.  Cardiovascular: S1 normal and S2 normal.  Exam reveals gallop and S3.   No murmur heard. Pulses:      Dorsalis pedis pulses are 2+ on the right side, and 2+ on the left side.  Respiratory: No respiratory distress.   GI: Soft. Bowel sounds are normal. There is no tenderness.  Musculoskeletal:       Right ankle: She exhibits swelling.       Left ankle: She exhibits swelling.  Lymphadenopathy:    She has no cervical adenopathy.  Neurological: She is alert and oriented to person, place, and time. No cranial nerve deficit.  Skin: Skin is  warm. No rash noted. Nails show no clubbing.  Psychiatric: She has a normal mood and affect.    Data Reviewed: Basic Metabolic Panel:  Recent Labs Lab 12/15/14 0910 12/17/14 0517  NA 135 133*  K 3.6 3.8  CL 99* 96*  CO2 31 32  GLUCOSE 213* 346*  BUN 14 18  CREATININE 1.00 1.07*  CALCIUM 8.0* 8.4*   Liver Function Tests:  Recent Labs Lab 12/15/14 0910  AST 25  ALT 21  ALKPHOS 78  BILITOT 0.1*  PROT 5.7*  ALBUMIN 2.5*   CBC:  Recent Labs Lab 12/15/14 0910  WBC 5.9  NEUTROABS 3.7  HGB 9.0*  HCT 30.0*  MCV 70.3*  PLT 408   Cardiac Enzymes:  Recent Labs Lab 12/15/14 0910 12/16/14 1217 12/16/14 1712 12/16/14 2236  TROPONINI 0.06* 0.05* 0.05* 0.06*   BNP (last 3 results)  Recent Labs  12/15/14 0910  BNP 91.0   CBG:  Recent Labs Lab 12/16/14 1642 12/16/14 2028 12/17/14 0737 12/17/14 1201 12/18/14 0739  GLUCAP 237* 276* 337* 261* 369*      Studies: No results found.  Scheduled Meds: . amLODipine  10 mg Oral Daily  . aspirin EC  81 mg Oral Daily  . budesonide (PULMICORT) nebulizer solution  0.25 mg Nebulization BID  . enoxaparin (LOVENOX) injection  40 mg Subcutaneous Q12H  . furosemide  20 mg Intravenous BID  . hydrALAZINE  50 mg Oral 3 times per day  . insulin aspart  0-15 Units Subcutaneous TID WC  .  insulin aspart  0-5 Units Subcutaneous QHS  . insulin aspart protamine- aspart  40 Units Subcutaneous BID WC  . lisinopril  40 mg Oral Daily  . metoprolol tartrate  50 mg Oral BID  . potassium chloride  10 mEq Oral BID  . sodium chloride  3 mL Intravenous Q12H    Assessment/Plan:  1. Accelerated hypertension- continue Norvasc, increase hydralazine to 100 mg 3 times a day, continue HCTZ and lisinopril. Add metoprolol 2. Acute diastolic congestive heart failure exasperation with pericardial effusion- continue diuresis patient's symptoms improving . 3. Morbid obesity and likely sleep apnea will need sleep study as outpatient. 4.  Uncontrolled diabetes mellitus NovoLog 70/30 increase increase to 55 units twice a day 5. History of asthma-continue nebulizers. 6. Elevated troponin- likely demand ischemia from heart failure.  Code Status:     Code Status Orders        Start     Ordered   12/15/14 1154  Full code   Continuous     12/15/14 1154     Disposition Plan: Home soon  Consultants:  Cardiology  Time spent: 25 minutes  [place], [facility] [place] [facility]

## 2014-12-19 DIAGNOSIS — I5030 Unspecified diastolic (congestive) heart failure: Secondary | ICD-10-CM

## 2014-12-19 DIAGNOSIS — I110 Hypertensive heart disease with heart failure: Secondary | ICD-10-CM

## 2014-12-19 DIAGNOSIS — I5031 Acute diastolic (congestive) heart failure: Secondary | ICD-10-CM

## 2014-12-19 LAB — BASIC METABOLIC PANEL
Anion gap: 7 (ref 5–15)
BUN: 21 mg/dL — ABNORMAL HIGH (ref 6–20)
CALCIUM: 8.5 mg/dL — AB (ref 8.9–10.3)
CHLORIDE: 94 mmol/L — AB (ref 101–111)
CO2: 31 mmol/L (ref 22–32)
CREATININE: 0.97 mg/dL (ref 0.44–1.00)
GFR calc non Af Amer: >60 mL/min (ref >60)
Glucose, Bld: 326 mg/dL — ABNORMAL HIGH (ref 65–99)
Potassium: 4.1 mmol/L (ref 3.5–5.1)
Sodium: 132 mmol/L — ABNORMAL LOW (ref 135–145)

## 2014-12-19 LAB — GLUCOSE, CAPILLARY
GLUCOSE-CAPILLARY: 215 mg/dL — AB (ref 65–99)
Glucose-Capillary: 326 mg/dL — ABNORMAL HIGH (ref 65–99)

## 2014-12-19 MED ORDER — FUROSEMIDE 20 MG PO TABS: 20.0000 mg | ORAL_TABLET | Freq: Two times a day (BID) | ORAL | Status: DC

## 2014-12-19 MED ORDER — INSULIN ASPART PROT & ASPART (70-30 MIX) 100 UNIT/ML ~~LOC~~ SUSP: 55.0000 [IU] | Freq: Two times a day (BID) | SUBCUTANEOUS | Status: DC

## 2014-12-19 MED ORDER — ALBUTEROL SULFATE (2.5 MG/3ML) 0.083% IN NEBU: 2.5000 mg | INHALATION_SOLUTION | Freq: Four times a day (QID) | RESPIRATORY_TRACT | Status: DC | PRN

## 2014-12-19 MED ORDER — ASPIRIN 81 MG PO TBEC: 81.0000 mg | DELAYED_RELEASE_TABLET | Freq: Every day | ORAL | Status: DC

## 2014-12-19 MED ORDER — INSULIN ASPART 100 UNIT/ML ~~LOC~~ SOLN: 60.0000 [IU] | Freq: Two times a day (BID) | SUBCUTANEOUS | Status: DC

## 2014-12-19 MED ORDER — METOPROLOL TARTRATE 75 MG PO TABS: 75.0000 mg | ORAL_TABLET | Freq: Two times a day (BID) | ORAL | Status: DC

## 2014-12-19 MED ORDER — AMLODIPINE BESYLATE 10 MG PO TABS: 10.0000 mg | ORAL_TABLET | Freq: Every day | ORAL | Status: DC

## 2014-12-19 MED ORDER — DIAZEPAM 5 MG PO TABS: 5.0000 mg | ORAL_TABLET | Freq: Three times a day (TID) | ORAL | Status: DC | PRN

## 2014-12-19 MED ORDER — METOPROLOL TARTRATE 25 MG PO TABS
25.0000 mg | ORAL_TABLET | Freq: Once | ORAL | Status: AC
  Administered 2014-12-19: 25 mg via ORAL
  Filled 2014-12-19: qty 1

## 2014-12-19 MED ORDER — ACETAMINOPHEN 325 MG PO TABS: 650.0000 mg | ORAL_TABLET | Freq: Four times a day (QID) | ORAL | Status: DC | PRN

## 2014-12-19 MED ORDER — BUDESONIDE 0.25 MG/2ML IN SUSP: 0.2500 mg | Freq: Two times a day (BID) | RESPIRATORY_TRACT | Status: DC

## 2014-12-19 MED ORDER — POTASSIUM CHLORIDE CRYS ER 10 MEQ PO TBCR: 10.0000 meq | EXTENDED_RELEASE_TABLET | Freq: Two times a day (BID) | ORAL | Status: DC

## 2014-12-19 MED ORDER — METOPROLOL TARTRATE 50 MG PO TABS: 75.0000 mg | ORAL_TABLET | Freq: Two times a day (BID) | ORAL | Status: DC

## 2014-12-19 MED ORDER — ALBUTEROL SULFATE HFA 108 (90 BASE) MCG/ACT IN AERS: 2.0000 | INHALATION_SPRAY | Freq: Four times a day (QID) | RESPIRATORY_TRACT | Status: DC | PRN

## 2014-12-19 MED ORDER — HYDRALAZINE HCL 100 MG PO TABS: 100.0000 mg | ORAL_TABLET | Freq: Three times a day (TID) | ORAL | Status: DC

## 2014-12-19 NOTE — Progress Notes (Signed)
Initial appointment was made at the [facility] on December 30, 2014 at 11:00am. Thank you.

## 2014-12-19 NOTE — Progress Notes (Signed)
Patient blood pressure in better control, VSS, Patient being discharged home via personal vehicle

## 2014-12-19 NOTE — Progress Notes (Signed)
Inpatient Diabetes Program Recommendations  AACE/ADA: New Consensus Statement on Inpatient Glycemic Control (2015)  Target Ranges:  Prepandial:   less than 140 mg/dL      Peak postprandial:   less than 180 mg/dL (1-2 hours)      Critically ill patients:  140 - 180 mg/dL   Review of Glycemic Control  Spoke with RN Ria Comment regarding insulins ordered through [employer].   I did not hear back from MD but want the RN to be aware of the 3 prescriptions ordered(Novolog 60 units bid, Novolin 70/30 and Novolog 70/30).  She will review with Angela Nevin, the need to Novolin 70/30 insulin 30 minutes before meals- this is the only paper order the patient has received.    Gentry Fitz, RN, BA, MHA, CDE Diabetes Coordinator Inpatient Diabetes Program  [phone] (Team Pager) [phone] ([place]) 12/19/2014 11:52 AM

## 2014-12-19 NOTE — Care Management (Signed)
Spoke with attending regarding script for Lantus and Novolog insulin- medications patient was to have been taking prior to this admission but was unable to obtain due to lack of insurance.  Provided script for Novolin 70/30 as [facility] does not  have Novolog on the formulary.  Faxed patient's scripts.

## 2014-12-19 NOTE — Discharge Summary (Signed)
[name], 44 y.o., DOB Apr 26, 1970, MRN [id number]. Admission date: 12/15/2014 Discharge Date 12/19/2014 Primary MD Kasandra Knudsen, NP Admitting Physician Hillary Bow, MD  Admission Diagnosis  Pericardial effusion [I31.9] Dyspnea [R06.00] Elevated troponin I level [R79.89] Essential hypertension [I10] Anemia, unspecified anemia type [D64.9]  Discharge Diagnosis   Principal Problem:   Accelerated hypertension with diastolic congestive heart failure, NYHA class 3 (HCC) Active Problems:   Pericardial effusion   SOB (shortness of breath)   Diabetes (HCC)   Acute diastolic heart failure (HCC)   Elevated troponin   Hypertensive heart disease with congestive heart failure [facility])   medical noncompliance Suspected sleep apnea       Hospital Course  [name] is a 44 y.o. female with a known history of hypertension, diabetes not taking medications for 1 month presents to the emergency room complaining of worsening shortness of breath. She has noticed that she's extremely fatigued for one month along with exertional dyspnea. Patient also complained of swelling in the lower extremity. She was noted to have a very high blood pressure requiring IV blood pressure medications. Patient was also suspected to have diastolic CHF. Was treated with Lasix. She also was noted to have [place] pericardial effusion on the CT scan which was confirmed with 2-D echo. It was felt to be due to her poorly controlled blood pressure. There was no cardiovascular compromise from that. She was seen by cardiology who agreed with the diuresis of blood pressure control. [name] blood pressure is now much improved with the current regimen she is strongly recommended did continue to take her medications. She'll need outpatient follow-up with the cardiology as well as primary care provider to adjust her medications. She is also referred to pulmonary for evaluation for sleep study. At this time she has been assisted with all  of her medications. She will be receiving Medicaid next month and should be able to get these medications on her own.            Consults  cardiology  Significant Tests:  See full reports for all details    Dg Chest 2 View  12/15/2014  CLINICAL DATA:  Shortness of Breath EXAM: CHEST  2 VIEW COMPARISON:  11/20/2014 FINDINGS: Cardiomediastinal silhouette is stable. There is streaky right base medially atelectasis or early infiltrate. No pulmonary edema. IMPRESSION: Streaky right base medially atelectasis or early infiltrate. No pulmonary edema. Electronically Signed   By: Lahoma Crocker M.D.   On: 12/15/2014 09:46   Dg Chest 2 View  11/20/2014  CLINICAL DATA:  Cough, body aches and chills today. History of asthma. EXAM: CHEST  2 VIEW COMPARISON:  PA and lateral chest 02/05/2011 and 12/21/2008. FINDINGS: There is cardiomegaly without edema. No pneumothorax or pleural effusion. No focal bony abnormality. IMPRESSION: Cardiomegaly without acute disease. Electronically Signed   By: Inge Rise M.D.   On: 11/20/2014 15:27   Ct Angio Chest Pe W/cm &/or Wo Cm  12/15/2014  CLINICAL DATA:  Severe dyspnea EXAM: CT ANGIOGRAPHY CHEST WITH CONTRAST TECHNIQUE: Multidetector CT imaging of the chest was performed using the standard protocol during bolus administration of intravenous contrast. Multiplanar CT image reconstructions and MIPs were obtained to evaluate the vascular anatomy. CONTRAST:  120mL OMNIPAQUE IOHEXOL 350 MG/ML SOLN COMPARISON:  None. FINDINGS: THORACIC INLET/BODY WALL: No acute abnormality. MEDIASTINUM: Mild cardiomegaly with a moderate low-density pericardial effusion without serosal enhancement or nodularity. The fluid has increased from abdominal CT 10/28/2014. No straightening of the interventricular septum or free wall collapse.  Scan is essentially free breathing, limiting evaluation of the pulmonary arteries. No evidence of pulmonary embolism, with sensitivity limited to the segmental  level and proximal. No suspicious lymph nodes. Negative aorta. LUNG WINDOWS: Diffuse bronchial wall thickening with scattered subsegmental atelectasis. Small pleural effusions without pulmonary edema. UPPER ABDOMEN: No acute findings. OSSEOUS: No acute fracture. No suspicious lytic or blastic lesions. Thoracic dextroscoliosis. Review of the MIP images confirms the above findings. IMPRESSION: 1. CTA of the pulmonary arteries is limited by motion. No evidence of pulmonary embolism to the segmental level. 2. Bronchitis with subsegmental atelectasis. 3. Moderate pericardial effusion, increased from abdominal CT 10/28/2014, and trace pleural effusions. Electronically Signed   By: Monte Fantasia M.D.   On: 12/15/2014 11:20       Today   Subjective:   [name]  feels better this morning denies any chest pain shortness of breath much improved  Objective:   Blood pressure 137/79, pulse 84, temperature 98.5 F (36.9 C), temperature source Oral, resp. rate 18, height 5\' 4"  (1.626 m), weight 147.918 kg (326 lb 1.6 oz), last menstrual period 12/15/2014, SpO2 94 %.  .  Intake/Output Summary (Last 24 hours) at 12/19/14 1312 Last data filed at 12/19/14 0520  Gross per 24 hour  Intake    240 ml  Output   1900 ml  Net  -1660 ml    Exam VITAL SIGNS: Blood pressure 137/79, pulse 84, temperature 98.5 F (36.9 C), temperature source Oral, resp. rate 18, height 5\' 4"  (1.626 m), weight 147.918 kg (326 lb 1.6 oz), last menstrual period 12/15/2014, SpO2 94 %.  GENERAL:  44 y.o.-year-old patient lying in the bed with no acute distress.  EYES: Pupils equal, round, reactive to light and accommodation. No scleral icterus. Extraocular muscles intact.  HEENT: Head atraumatic, normocephalic. Oropharynx and nasopharynx clear.  NECK:  Supple, no jugular venous distention. No thyroid enlargement, no tenderness.  LUNGS: Normal breath sounds bilaterally, no wheezing, rales,rhonchi or crepitation. No use of accessory  muscles of respiration.  CARDIOVASCULAR: S1, S2 normal. No murmurs, rubs, or gallops.  ABDOMEN: Soft, nontender, nondistended. Bowel sounds present. No organomegaly or mass.  EXTREMITIES: No pedal edema, cyanosis, or clubbing.  NEUROLOGIC: Cranial nerves II through XII are intact. Muscle strength 5/5 in all extremities. Sensation intact. Gait not checked.  PSYCHIATRIC: The patient is alert and oriented x 3.  SKIN: No obvious rash, lesion, or ulcer.   Data Review     CBC w Diff: Lab Results  Component Value Date   WBC 5.9 12/15/2014   WBC 7.1 01/11/2014   HGB 9.0* 12/15/2014   HGB 12.4 01/11/2014   HCT 30.0* 12/15/2014   HCT 39.2 01/11/2014   PLT 408 12/15/2014   PLT 370 01/11/2014   LYMPHOPCT 25% 12/15/2014   LYMPHOPCT 25.9 01/11/2014   MONOPCT 8% 12/15/2014   MONOPCT 6.6 01/11/2014   EOSPCT 3% 12/15/2014   EOSPCT 2.4 01/11/2014   BASOPCT 1% 12/15/2014   BASOPCT 1.3 01/11/2014   CMP: Lab Results  Component Value Date   NA 132* 12/19/2014   NA 135* 11/21/2013   K 4.1 12/19/2014   K 4.1 11/21/2013   CL 94* 12/19/2014   CL 98 11/21/2013   CO2 31 12/19/2014   CO2 30 11/21/2013   BUN 21* 12/19/2014   BUN 16 11/21/2013   CREATININE 0.97 12/19/2014   CREATININE 0.85 11/21/2013   PROT 5.7* 12/15/2014   ALBUMIN 2.5* 12/15/2014   BILITOT 0.1* 12/15/2014   ALKPHOS 78 12/15/2014  AST 25 12/15/2014   ALT 21 12/15/2014  .  Micro Results No results found for this or any previous visit (from the past 240 hour(s)).      Code Status Orders        Start     Ordered   12/15/14 1154  Full code   Continuous     12/15/14 1154          Follow-up Information    Follow up with [place], [place], NP. Go on 12/26/2014.   Specialty:  Nurse Practitioner   Why:  Hospital follow up, App time at 2:15 pm   Contact information:   [address] [place] [address] [phone]       Follow up with Murray Hodgkins, NP. Go on 01/17/2015.   Specialties:  Nurse  Practitioner, Cardiology, Radiology   Why:  Hospital follow up, App time at 11:00 am   Contact information:   [place] [address] [phone]       Follow up with [facility]  In 14 days.   Why:  for sleep study set up      Follow up with Alisa Graff, FNP. Go on 12/30/2014.   Specialty:  Family Medicine   Why:  at 11:00am , to the [facility] Failure [facility]   Contact information:   [place] [address] [address] [phone]       Discharge Medications     Medication List    STOP taking these medications        azithromycin 250 MG tablet  Commonly known as:  ZITHROMAX Z-PAK     mupirocin ointment 2 %  Commonly known as:  BACTROBAN     predniSONE 10 MG (21) Tbpk tablet  Commonly known as:  STERAPRED UNI-PAK 21 TAB     sulfamethoxazole-trimethoprim 800-160 MG tablet  Commonly known as:  BACTRIM DS      TAKE these medications        acetaminophen 325 MG tablet  Commonly known as:  TYLENOL  Take 2 tablets (650 mg total) by mouth every 6 (six) hours as needed for mild pain (or Fever >/= 101).     albuterol 108 (90 BASE) MCG/ACT inhaler  Commonly known as:  PROVENTIL HFA;VENTOLIN HFA  Inhale 2 puffs into the lungs every 6 (six) hours as needed for wheezing or shortness of breath.     albuterol (2.5 MG/3ML) 0.083% nebulizer solution  Commonly known as:  PROVENTIL  Take 3 mLs (2.5 mg total) by nebulization every 6 (six) hours as needed for wheezing.     amLODipine 10 MG tablet  Commonly known as:  NORVASC  Take 1 tablet (10 mg total) by mouth daily.     aspirin 81 MG EC tablet  Take 1 tablet (81 mg total) by mouth daily.     budesonide 0.25 MG/2ML nebulizer solution  Commonly known as:  PULMICORT  Take 2 mLs (0.25 mg total) by nebulization 2 (two) times daily.     diazepam 5 MG tablet  Commonly known as:  VALIUM  Take 1 tablet (5 mg total) by mouth every 8 (eight) hours as needed for muscle spasms.      furosemide 20 MG tablet  Commonly known as:  LASIX  Take 1 tablet (20 mg total) by mouth 2 (two) times daily.     hydrALAZINE 100 MG tablet  Commonly known as:  APRESOLINE  Take 1 tablet (100 mg total) by  mouth every 8 (eight) hours.     ibuprofen 800 MG tablet  Commonly known as:  ADVIL,MOTRIN  Take 1 tablet (800 mg total) by mouth every 8 (eight) hours as needed.     insulin aspart 100 UNIT/ML injection  Commonly known as:  novoLOG  Inject 60 Units into the skin 2 (two) times daily.     insulin aspart protamine- aspart (70-30) 100 UNIT/ML injection  Commonly known as:  NOVOLOG MIX 70/30  Inject 0.55 mLs (55 Units total) into the skin 2 (two) times daily with a meal.     Metoprolol Tartrate 75 MG Tabs  Take 75 mg by mouth 2 (two) times daily.     potassium chloride 10 MEQ tablet  Commonly known as:  K-DUR,KLOR-CON  Take 1 tablet (10 mEq total) by mouth 2 (two) times daily.           Total Time in preparing paper work, data evaluation and todays exam - 35 minutes  Dustin Flock M.D on 12/19/2014 at 1:12 PM  [facility]   Office  [phone]

## 2014-12-19 NOTE — Discharge Instructions (Signed)
Heart Failure Clinic appointment on December 30, 2014 at 11:00am with [name], [place]. Please call [phone] to reschedule.     DIET:  Regular diet and Cardiac diet, carbohydrate controlled diet  DISCHARGE CONDITION:  Stable  ACTIVITY:  Activity as tolerated  OXYGEN:  Home Oxygen: No.   Oxygen Delivery: room air  DISCHARGE LOCATION:  home    ADDITIONAL DISCHARGE INSTRUCTION: must take medication as prescribed and follow diet   If you experience worsening of your admission symptoms, develop shortness of breath, life threatening emergency, suicidal or homicidal thoughts you must seek medical attention immediately by calling 911 or calling your MD immediately  if symptoms less severe.  You Must read complete instructions/literature along with all the possible adverse reactions/side effects for all the Medicines you take and that have been prescribed to you. Take any new Medicines after you have completely understood and accpet all the possible adverse reactions/side effects.   Please note  You were cared for by a hospitalist during your hospital stay. If you have any questions about your discharge medications or the care you received while you were in the hospital after you are discharged, you can call the unit and asked to speak with the hospitalist on call if the hospitalist that took care of you is not available. Once you are discharged, your primary care physician will handle any further medical issues. Please note that NO REFILLS for any discharge medications will be authorized once you are discharged, as it is imperative that you return to your primary care physician (or establish a relationship with a primary care physician if you do not have one) for your aftercare needs so that they can reassess your need for medications and monitor your lab values.

## 2014-12-19 NOTE — Progress Notes (Signed)
Inpatient Diabetes Program Recommendations  AACE/ADA: New Consensus Statement on Inpatient Glycemic Control (2015)  Target Ranges:  Prepandial:   less than 140 mg/dL      Peak postprandial:   less than 180 mg/dL (1-2 hours)      Critically ill patients:  140 - 180 mg/dL   Review of Glycemic Control  Results for GENE, NISKA (MRN [id number]) as of 12/19/2014 09:54  Ref. Range 12/18/2014 07:39 12/18/2014 11:18 12/18/2014 16:40 12/18/2014 21:24 12/19/2014 07:32  Glucose-Capillary Latest Ref Range: 65-99 mg/dL 369 (H) 262 (H) 198 (H) 353 (H) 326 (H)    Diabetes history: Type 2 Outpatient Diabetes medications: Novolog 30 units bid- not taking, taking Novolog mix 70/30 30 units bid- has 1 dose left Current orders for Inpatient glycemic control: Novolog 70/30 mix 55 units bid, Novolog 0-15 units tid, Novolog 0-5 units qhs  Inpatient Diabetes Program Recommendations:  The 70/30 insulin was not given at supper time yesterday and therefore blood sugars are elevated this am.  2 different orders on the discharge note for insulin; Novolog mix 70/30 55 units bid and Novolog 60 units bid.  Case manager notes indicate the patient was given a script for Novolin- awaiting a call MD to clarify orders.  Gentry Fitz, RN, BA, MHA, CDE Diabetes Coordinator Inpatient Diabetes Program  [phone] (Team Pager) [phone] ([place]) 12/19/2014 10:14 AM

## 2014-12-19 NOTE — Progress Notes (Signed)
Patient: [name] / Admit Date: 12/15/2014 / Date of Encounter: 12/19/2014, 8:55 AM   Subjective: Continues to improve. She has developed a cough overnight. She feels like it may be related to the lisinopril as this as previously caused a cough in her. She is minus 14,977 mL for the admission. BP has improved to the 123456 systolic.   Review of Systems: Review of Systems  Constitutional: Positive for malaise/fatigue. Negative for fever, chills, weight loss and diaphoresis.  HENT: Negative for congestion.   Eyes: Negative for discharge and redness.  Respiratory: Positive for cough. Negative for hemoptysis, sputum production, shortness of breath and wheezing.   Cardiovascular: Positive for leg swelling. Negative for chest pain, palpitations, orthopnea, claudication and PND.  Gastrointestinal: Negative for heartburn, nausea, vomiting and abdominal pain.  Musculoskeletal: Negative for falls.  Skin: Negative for rash.  Neurological: Positive for weakness. Negative for dizziness, tingling, tremors, sensory change, speech change, focal weakness and loss of consciousness.  Endo/Heme/Allergies: Does not bruise/bleed easily.  Psychiatric/Behavioral: The patient is not nervous/anxious.      Objective: Telemetry: NSR, 90's Physical Exam: Blood pressure 132/78, pulse 88, temperature 98 F (36.7 C), temperature source Oral, resp. rate 20, height 5\' 4"  (1.626 m), weight 326 lb 1.6 oz (147.918 kg), last menstrual period 12/15/2014, SpO2 95 %. Body mass index is 55.95 kg/(m^2). General: Well developed, well nourished, in no acute distress. Head: Normocephalic, atraumatic, sclera non-icteric, no xanthomas, nares are without discharge. Neck: Negative for carotid bruits. JVP not elevated. Lungs: Decreased breath sounds bilateral bases. Breathing is unlabored. Heart: RRR S1 S2 without murmurs, rubs, or gallops.  Abdomen: Obese, soft, non-tender, non-distended with normoactive bowel sounds. No  rebound/guarding. Extremities: No clubbing or cyanosis. 1+ pitting edema to the bilateral knees. Distal pedal pulses are 2+ and equal bilaterally. Neuro: Alert and oriented X 3. Moves all extremities spontaneously. Psych:  Responds to questions appropriately with a normal affect.   Intake/Output Summary (Last 24 hours) at 12/19/14 0855 Last data filed at 12/19/14 0520  Gross per 24 hour  Intake    480 ml  Output   2300 ml  Net  -1820 ml    Inpatient Medications:  . amLODipine  10 mg Oral Daily  . aspirin EC  81 mg Oral Daily  . budesonide (PULMICORT) nebulizer solution  0.25 mg Nebulization BID  . enoxaparin (LOVENOX) injection  40 mg Subcutaneous Q12H  . furosemide  20 mg Intravenous BID  . hydrALAZINE  100 mg Oral 3 times per day  . insulin aspart  0-15 Units Subcutaneous TID WC  . insulin aspart  0-5 Units Subcutaneous QHS  . insulin aspart protamine- aspart  55 Units Subcutaneous BID WC  . lisinopril  40 mg Oral Daily  . metoprolol tartrate  50 mg Oral BID  . potassium chloride  10 mEq Oral BID  . sodium chloride  3 mL Intravenous Q12H   Infusions:    Labs:  Recent Labs  12/17/14 0517  NA 133*  K 3.8  CL 96*  CO2 32  GLUCOSE 346*  BUN 18  CREATININE 1.07*  CALCIUM 8.4*   No results for input(s): AST, ALT, ALKPHOS, BILITOT, PROT, ALBUMIN in the last 72 hours. No results for input(s): WBC, NEUTROABS, HGB, HCT, MCV, PLT in the last 72 hours.  Recent Labs  12/16/14 1217 12/16/14 1712 12/16/14 2236  TROPONINI 0.05* 0.05* 0.06*   Invalid input(s): POCBNP No results for input(s): HGBA1C in the last 72 hours.  Weights: Filed Weights   12/17/14 0500 12/18/14 0500 12/19/14 0517  Weight: 326 lb 6.4 oz (148.054 kg) 326 lb 1 oz (147.901 kg) 326 lb 1.6 oz (147.918 kg)     Radiology/Studies:  Dg Chest 2 View  12/15/2014  CLINICAL DATA:  Shortness of Breath EXAM: CHEST  2 VIEW COMPARISON:  11/20/2014 FINDINGS: Cardiomediastinal silhouette is stable. There is  streaky right base medially atelectasis or early infiltrate. No pulmonary edema. IMPRESSION: Streaky right base medially atelectasis or early infiltrate. No pulmonary edema. Electronically Signed   By: Lahoma Crocker M.D.   On: 12/15/2014 09:46   Dg Chest 2 View  11/20/2014  CLINICAL DATA:  Cough, body aches and chills today. History of asthma. EXAM: CHEST  2 VIEW COMPARISON:  PA and lateral chest 02/05/2011 and 12/21/2008. FINDINGS: There is cardiomegaly without edema. No pneumothorax or pleural effusion. No focal bony abnormality. IMPRESSION: Cardiomegaly without acute disease. Electronically Signed   By: Inge Rise M.D.   On: 11/20/2014 15:27   Ct Angio Chest Pe W/cm &/or Wo Cm  12/15/2014  CLINICAL DATA:  Severe dyspnea EXAM: CT ANGIOGRAPHY CHEST WITH CONTRAST TECHNIQUE: Multidetector CT imaging of the chest was performed using the standard protocol during bolus administration of intravenous contrast. Multiplanar CT image reconstructions and MIPs were obtained to evaluate the vascular anatomy. CONTRAST:  131mL OMNIPAQUE IOHEXOL 350 MG/ML SOLN COMPARISON:  None. FINDINGS: THORACIC INLET/BODY WALL: No acute abnormality. MEDIASTINUM: Mild cardiomegaly with a moderate low-density pericardial effusion without serosal enhancement or nodularity. The fluid has increased from abdominal CT 10/28/2014. No straightening of the interventricular septum or free wall collapse. Scan is essentially free breathing, limiting evaluation of the pulmonary arteries. No evidence of pulmonary embolism, with sensitivity limited to the segmental level and proximal. No suspicious lymph nodes. Negative aorta. LUNG WINDOWS: Diffuse bronchial wall thickening with scattered subsegmental atelectasis. Small pleural effusions without pulmonary edema. UPPER ABDOMEN: No acute findings. OSSEOUS: No acute fracture. No suspicious lytic or blastic lesions. Thoracic dextroscoliosis. Review of the MIP images confirms the above findings.  IMPRESSION: 1. CTA of the pulmonary arteries is limited by motion. No evidence of pulmonary embolism to the segmental level. 2. Bronchitis with subsegmental atelectasis. 3. Moderate pericardial effusion, increased from abdominal CT 10/28/2014, and trace pleural effusions. Electronically Signed   By: Monte Fantasia M.D.   On: 12/15/2014 11:20     Assessment and Plan  44 y.o. female with h/o poorly controlled diabetes, iron deficiency anemia, HTN, and asthma who has not been on any medications for the last 1 month presented to [facility] on 10/20 with cough, SOB, lower extremity edema and weight gain of 40 pounds over the past 6 months. Cardiology is consulted for moderate sized pericardial effusion seen on CTA chest. Echo is pending at this time.   1. Small to moderate circumferential pericardial effusion without evidence of hemodynamic compromise: -Echo with EF 50-55%, no RWMA, GR1DD, mildly dilated left atrium, small to moderate pericardial effusion that was circumferential to the heart without evidence of hemodynamic compromise -Change IV Lasix 20 mg q 12 hours to PO Lasix 20 mg bid -Continue KCl 10 meq bid for potassium repletion  -Uncertain underlying etiology at this time, possibly in the setting poor medication compliance and accelerated HTN vs infection   2. Hypertensive heart disease: -BP slightly improved this morning -Lisinopril likely leading to her cough, this has been discontinued  -Continue Norvasc to 10 mg daily -Increase metoprolol to 75 mg bid -Continue Lasix as above -Continue hydralazine  100 mg q 8 hours  3. Bronchitis/early infiltrate: -Consider ABX -Continue nebs per IM  4. Elevated troponin: -Flat trending, likely supply demand ischemia in the setting of the above -Consider outpatient nuclear stress testing to evaluate for high risk ischemia   5. DM2: -SSI per IM -Poorly controlled  6. Iron deficiency anemia -Stable -May be playing a role in her SOB as  well -Notes pica  -Needs outpatient follow up  7. Asthma: -Continue current treatment    Signed, Christell Faith, PA-C Pager: [phone] 12/19/2014, 8:55 AM

## 2014-12-30 ENCOUNTER — Ambulatory Visit: Payer: Medicaid Other | Admitting: Family

## 2014-12-31 ENCOUNTER — Encounter: Payer: Self-pay | Admitting: *Deleted

## 2014-12-31 ENCOUNTER — Emergency Department: Payer: Medicaid Other

## 2014-12-31 ENCOUNTER — Observation Stay
Admission: EM | Admit: 2014-12-31 | Discharge: 2015-01-03 | Disposition: A | Discharge status: Home / Self Care | Payer: Medicaid Other | Attending: Internal Medicine | Admitting: Internal Medicine

## 2014-12-31 DIAGNOSIS — J40 Bronchitis, not specified as acute or chronic: Secondary | ICD-10-CM | POA: Insufficient documentation

## 2014-12-31 DIAGNOSIS — J90 Pleural effusion, not elsewhere classified: Secondary | ICD-10-CM | POA: Insufficient documentation

## 2014-12-31 DIAGNOSIS — I5033 Acute on chronic diastolic (congestive) heart failure: Secondary | ICD-10-CM | POA: Diagnosis not present

## 2014-12-31 DIAGNOSIS — Z794 Long term (current) use of insulin: Secondary | ICD-10-CM | POA: Insufficient documentation

## 2014-12-31 DIAGNOSIS — Z833 Family history of diabetes mellitus: Secondary | ICD-10-CM | POA: Insufficient documentation

## 2014-12-31 DIAGNOSIS — R918 Other nonspecific abnormal finding of lung field: Secondary | ICD-10-CM | POA: Diagnosis not present

## 2014-12-31 DIAGNOSIS — E1165 Type 2 diabetes mellitus with hyperglycemia: Secondary | ICD-10-CM | POA: Insufficient documentation

## 2014-12-31 DIAGNOSIS — R748 Abnormal levels of other serum enzymes: Secondary | ICD-10-CM | POA: Insufficient documentation

## 2014-12-31 DIAGNOSIS — E871 Hypo-osmolality and hyponatremia: Secondary | ICD-10-CM | POA: Insufficient documentation

## 2014-12-31 DIAGNOSIS — Z882 Allergy status to sulfonamides status: Secondary | ICD-10-CM | POA: Insufficient documentation

## 2014-12-31 DIAGNOSIS — R05 Cough: Secondary | ICD-10-CM | POA: Insufficient documentation

## 2014-12-31 DIAGNOSIS — I110 Hypertensive heart disease with heart failure: Principal | ICD-10-CM | POA: Insufficient documentation

## 2014-12-31 DIAGNOSIS — I10 Essential (primary) hypertension: Secondary | ICD-10-CM | POA: Insufficient documentation

## 2014-12-31 DIAGNOSIS — Z6841 Body Mass Index (BMI) 40.0 and over, adult: Secondary | ICD-10-CM | POA: Diagnosis not present

## 2014-12-31 DIAGNOSIS — I509 Heart failure, unspecified: Secondary | ICD-10-CM

## 2014-12-31 DIAGNOSIS — J45909 Unspecified asthma, uncomplicated: Secondary | ICD-10-CM | POA: Insufficient documentation

## 2014-12-31 DIAGNOSIS — M7989 Other specified soft tissue disorders: Secondary | ICD-10-CM | POA: Insufficient documentation

## 2014-12-31 DIAGNOSIS — Z8249 Family history of ischemic heart disease and other diseases of the circulatory system: Secondary | ICD-10-CM | POA: Insufficient documentation

## 2014-12-31 DIAGNOSIS — Z79899 Other long term (current) drug therapy: Secondary | ICD-10-CM | POA: Diagnosis not present

## 2014-12-31 DIAGNOSIS — E785 Hyperlipidemia, unspecified: Secondary | ICD-10-CM | POA: Diagnosis not present

## 2014-12-31 DIAGNOSIS — Z7982 Long term (current) use of aspirin: Secondary | ICD-10-CM | POA: Insufficient documentation

## 2014-12-31 DIAGNOSIS — N289 Disorder of kidney and ureter, unspecified: Secondary | ICD-10-CM | POA: Insufficient documentation

## 2014-12-31 DIAGNOSIS — J9811 Atelectasis: Secondary | ICD-10-CM | POA: Insufficient documentation

## 2014-12-31 DIAGNOSIS — I313 Pericardial effusion (noninflammatory): Secondary | ICD-10-CM | POA: Diagnosis not present

## 2014-12-31 DIAGNOSIS — D509 Iron deficiency anemia, unspecified: Secondary | ICD-10-CM | POA: Insufficient documentation

## 2014-12-31 DIAGNOSIS — R0602 Shortness of breath: Secondary | ICD-10-CM | POA: Diagnosis not present

## 2014-12-31 DIAGNOSIS — Z7951 Long term (current) use of inhaled steroids: Secondary | ICD-10-CM | POA: Diagnosis not present

## 2014-12-31 HISTORY — DX: Heart failure, unspecified: I50.9

## 2014-12-31 LAB — COMPREHENSIVE METABOLIC PANEL
ALK PHOS: 88 U/L (ref 38–126)
ALT: 17 U/L (ref 14–54)
ANION GAP: 3 — AB (ref 5–15)
AST: 16 U/L (ref 15–41)
Albumin: 2.7 g/dL — ABNORMAL LOW (ref 3.5–5.0)
BUN: 11 mg/dL (ref 6–20)
CALCIUM: 8.4 mg/dL — AB (ref 8.9–10.3)
CO2: 30 mmol/L (ref 22–32)
CREATININE: 0.84 mg/dL (ref 0.44–1.00)
Chloride: 101 mmol/L (ref 101–111)
Glucose, Bld: 235 mg/dL — ABNORMAL HIGH (ref 65–99)
Potassium: 4.2 mmol/L (ref 3.5–5.1)
Sodium: 134 mmol/L — ABNORMAL LOW (ref 135–145)
Total Bilirubin: 0.2 mg/dL — ABNORMAL LOW (ref 0.3–1.2)
Total Protein: 6.2 g/dL — ABNORMAL LOW (ref 6.5–8.1)

## 2014-12-31 LAB — CBC
HCT: 30.4 % — ABNORMAL LOW (ref 35.0–47.0)
HEMOGLOBIN: 9.4 g/dL — AB (ref 12.0–16.0)
MCH: 21.3 pg — AB (ref 26.0–34.0)
MCHC: 31.1 g/dL — AB (ref 32.0–36.0)
MCV: 68.7 fL — AB (ref 80.0–100.0)
PLATELETS: 443 10*3/uL — AB (ref 150–440)
RBC: 4.42 MIL/uL (ref 3.80–5.20)
RDW: 16.7 % — ABNORMAL HIGH (ref 11.5–14.5)
WBC: 7.6 10*3/uL (ref 3.6–11.0)

## 2014-12-31 LAB — BRAIN NATRIURETIC PEPTIDE: B Natriuretic Peptide: 127 pg/mL — ABNORMAL HIGH (ref 0.0–100.0)

## 2014-12-31 LAB — GLUCOSE, CAPILLARY: GLUCOSE-CAPILLARY: 145 mg/dL — AB (ref 65–99)

## 2014-12-31 LAB — TROPONIN I
TROPONIN I: 0.03 ng/mL (ref <0.031)
TROPONIN I: 0.04 ng/mL — AB (ref <0.031)

## 2014-12-31 IMAGING — CR DG CHEST 2V
1 series · 2 of 2 positions shown · non-contrast
Comparison: [DATE] chest radiograph

CLINICAL DATA: Shortness of breath

EXAM:
CHEST  2 VIEW

[Series 1: dg chest 2 view · 0.14mm/px · 2 of 2 slices shown]
[im 1/2]
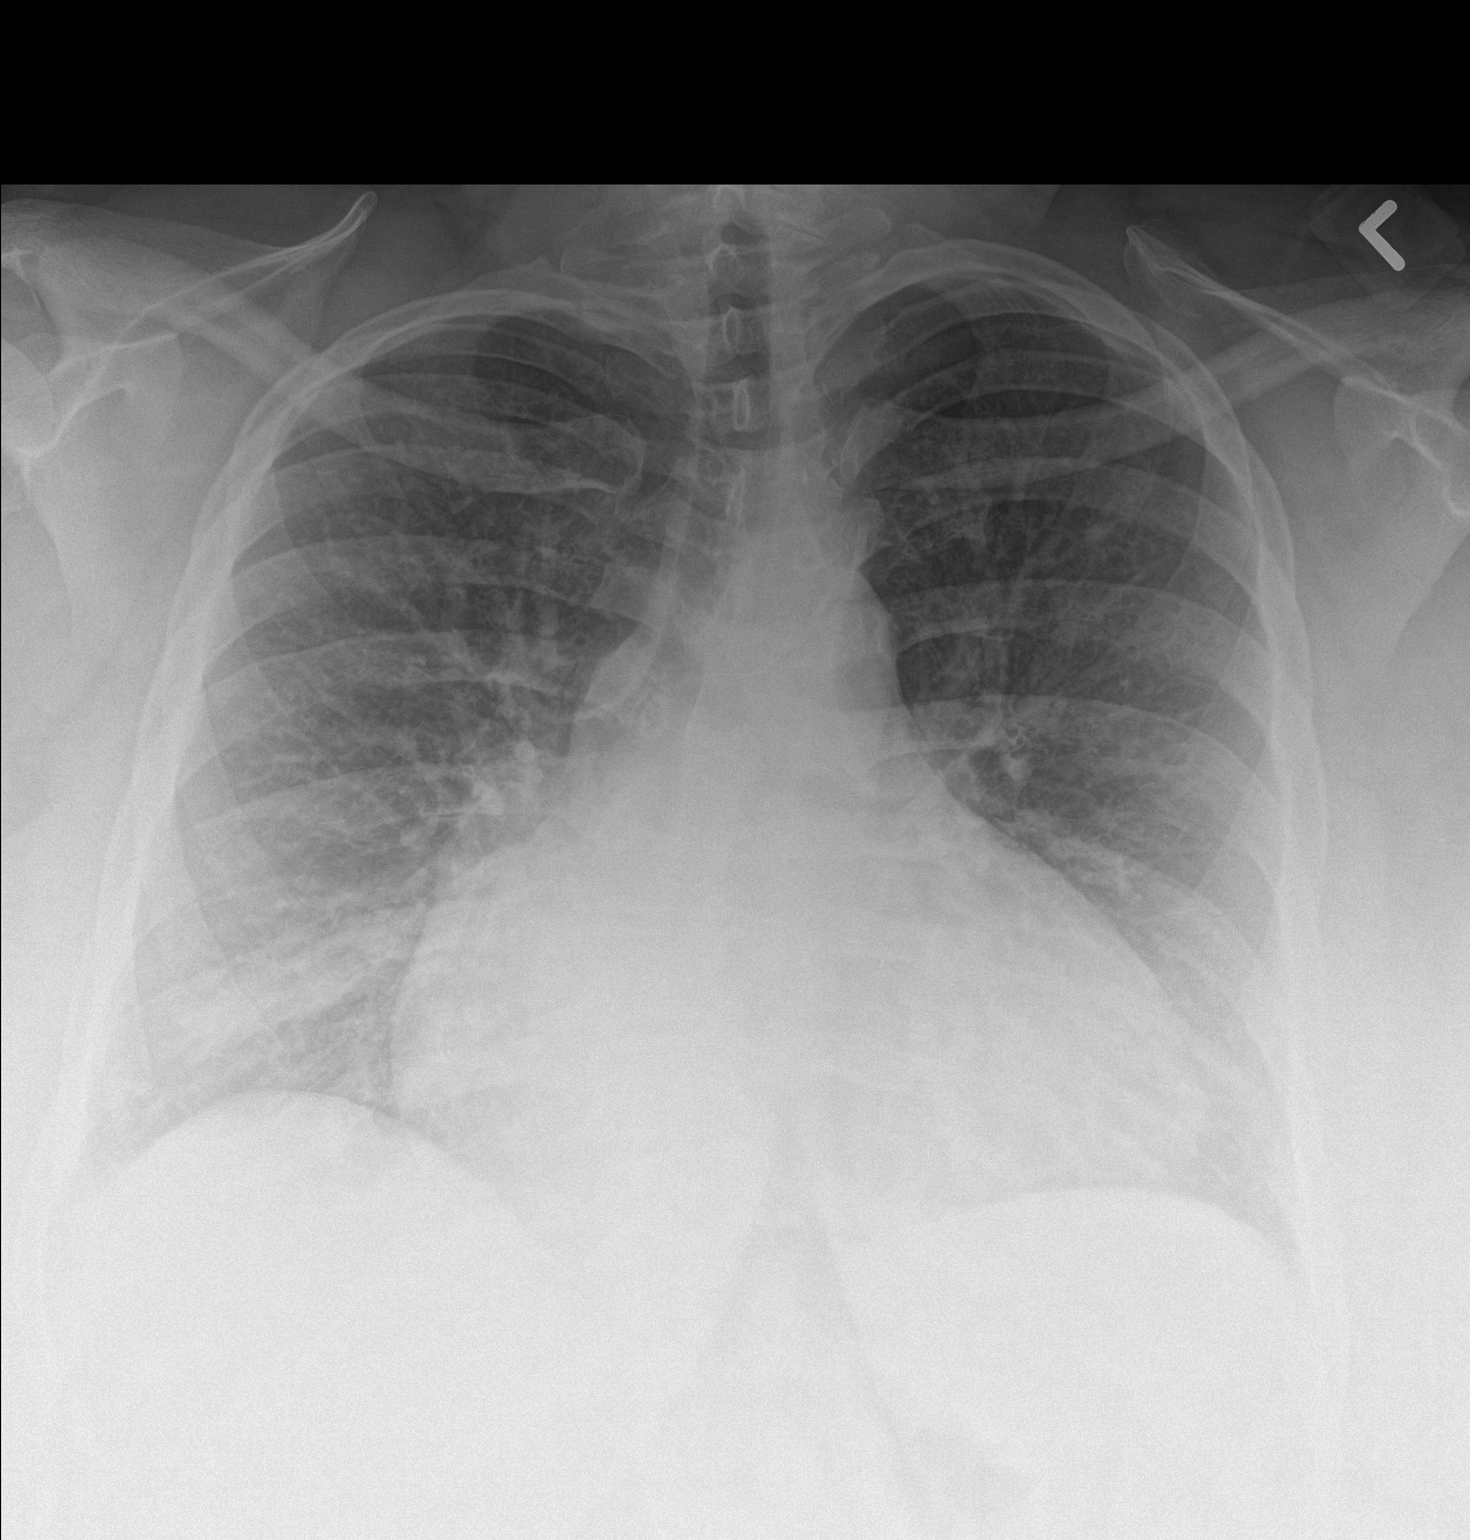
[im 2/2]
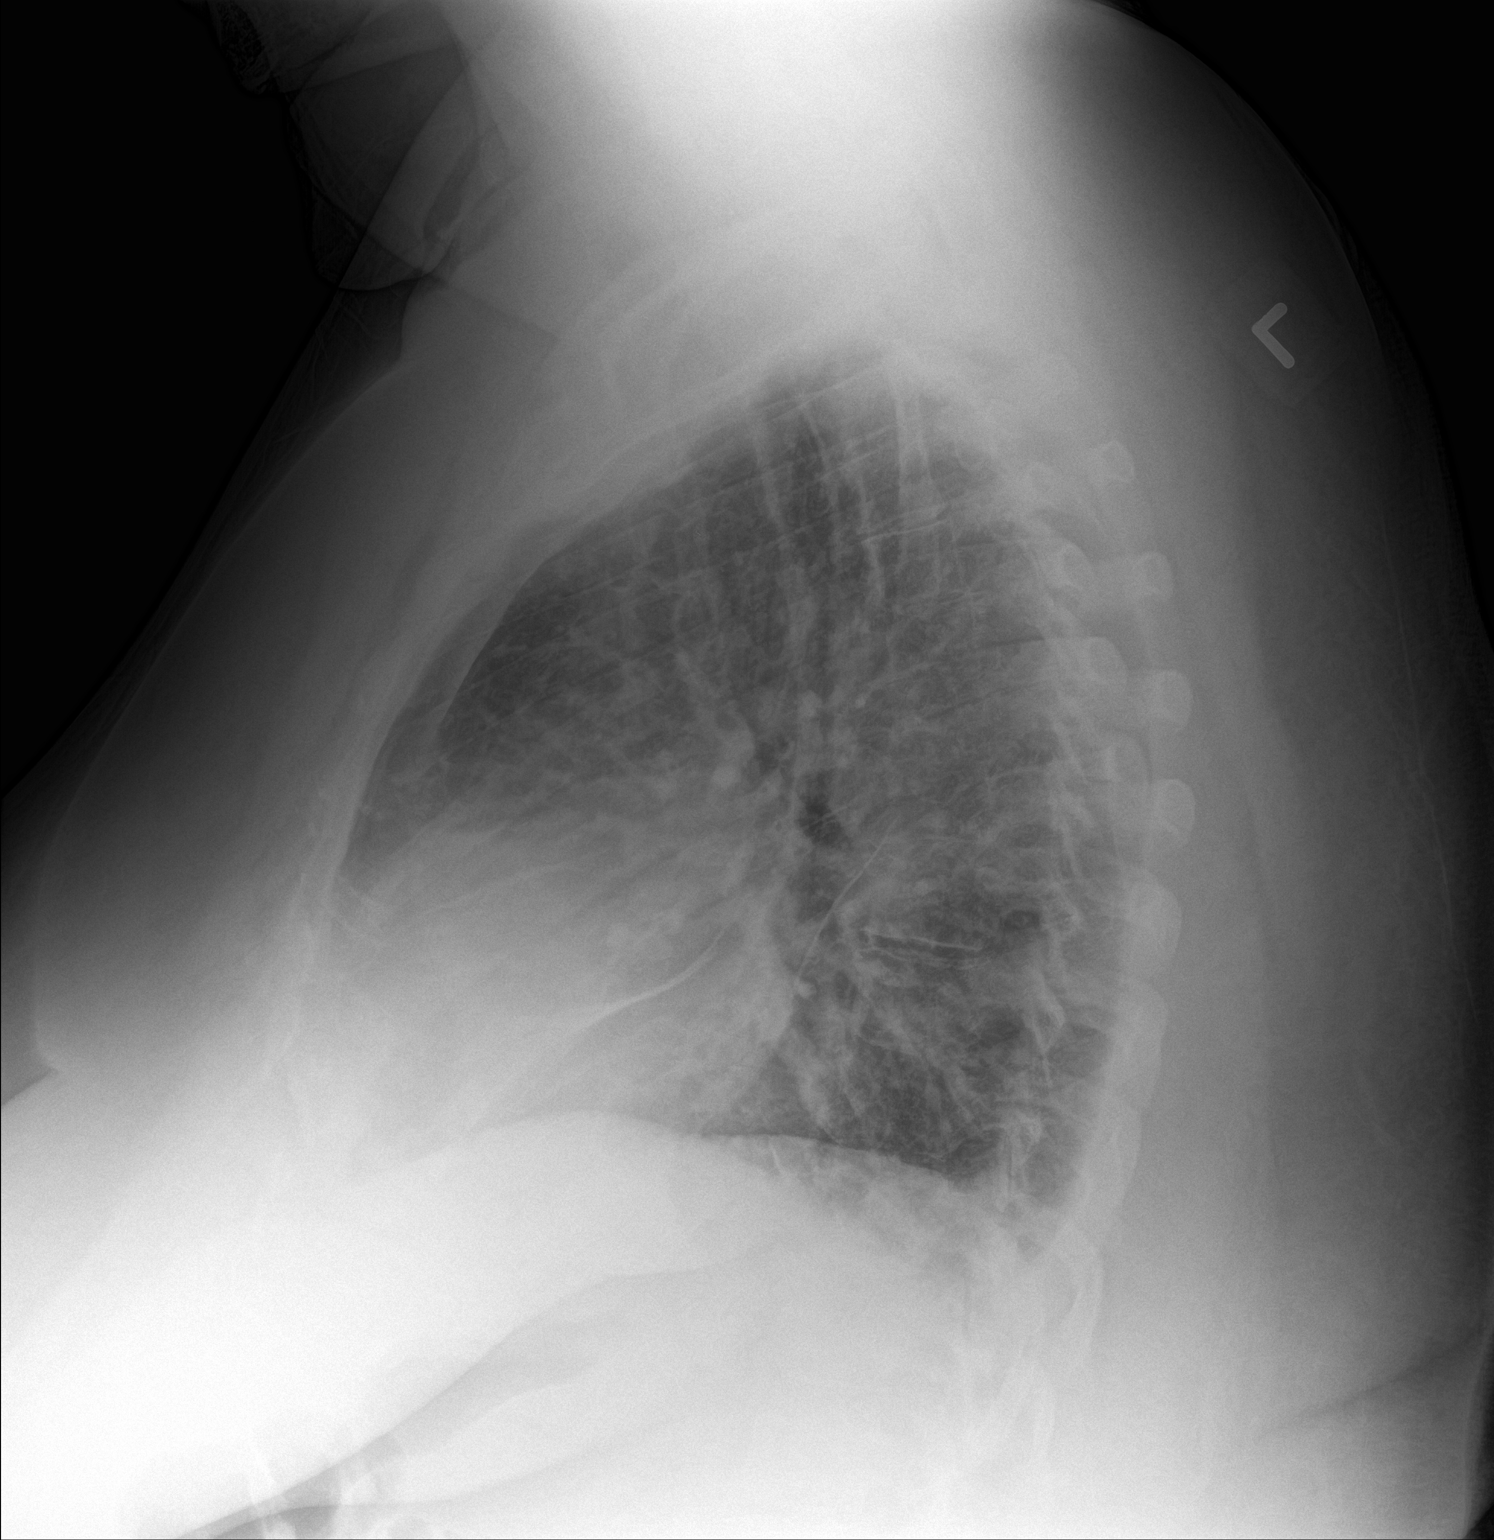

[2 of 2 positions shown; findings below may reference images not displayed]

FINDINGS: Stable cardiomediastinal silhouette with mild cardiomegaly. No
pneumothorax. No pleural effusion. Mild pulmonary edema. There is
focal curvilinear opacity in the right middle lobe.
IMPRESSION: 1. Mild cardiomegaly with mild pulmonary edema, most in keeping with
mild congestive heart failure.
2. Focal curvilinear opacity in the right middle lobe, favor
atelectasis.

## 2014-12-31 MED ORDER — HYDRALAZINE HCL 50 MG PO TABS
100.0000 mg | ORAL_TABLET | Freq: Three times a day (TID) | ORAL | Status: DC
  Administered 2014-12-31 – 2015-01-03 (×5): 100 mg via ORAL
  Filled 2014-12-31 (×7): qty 2

## 2014-12-31 MED ORDER — INSULIN ASPART PROT & ASPART (70-30 MIX) 100 UNIT/ML ~~LOC~~ SUSP
55.0000 [IU] | Freq: Two times a day (BID) | SUBCUTANEOUS | Status: DC
  Administered 2015-01-01 – 2015-01-02 (×4): 55 [IU] via SUBCUTANEOUS
  Filled 2014-12-31 (×4): qty 55

## 2014-12-31 MED ORDER — IPRATROPIUM-ALBUTEROL 0.5-2.5 (3) MG/3ML IN SOLN
3.0000 mL | RESPIRATORY_TRACT | Status: DC | PRN
  Administered 2015-01-01 – 2015-01-03 (×8): 3 mL via RESPIRATORY_TRACT
  Filled 2014-12-31 (×8): qty 3

## 2014-12-31 MED ORDER — LISINOPRIL 5 MG PO TABS: 5.0000 mg | ORAL_TABLET | Freq: Every day | ORAL | Status: DC

## 2014-12-31 MED ORDER — BUDESONIDE 0.25 MG/2ML IN SUSP
0.2500 mg | Freq: Two times a day (BID) | RESPIRATORY_TRACT | Status: DC
  Administered 2014-12-31 – 2015-01-03 (×6): 0.25 mg via RESPIRATORY_TRACT
  Filled 2014-12-31 (×6): qty 2

## 2014-12-31 MED ORDER — METOPROLOL TARTRATE 50 MG PO TABS
75.0000 mg | ORAL_TABLET | Freq: Two times a day (BID) | ORAL | Status: DC
  Administered 2014-12-31: 100 mg via ORAL
  Administered 2015-01-01 – 2015-01-03 (×4): 75 mg via ORAL
  Filled 2014-12-31 (×6): qty 2

## 2014-12-31 MED ORDER — MORPHINE SULFATE (PF) 2 MG/ML IV SOLN: 2.0000 mg | INTRAVENOUS | Status: DC | PRN

## 2014-12-31 MED ORDER — ONDANSETRON HCL 4 MG/2ML IJ SOLN: 4.0000 mg | Freq: Four times a day (QID) | INTRAMUSCULAR | Status: DC | PRN

## 2014-12-31 MED ORDER — INSULIN ASPART 100 UNIT/ML ~~LOC~~ SOLN
0.0000 [IU] | Freq: Three times a day (TID) | SUBCUTANEOUS | Status: DC
Start: 2015-01-01 — End: 2015-01-03
  Administered 2015-01-01: 3 [IU] via SUBCUTANEOUS
  Administered 2015-01-01 – 2015-01-02 (×4): 2 [IU] via SUBCUTANEOUS
  Administered 2015-01-03: 1 [IU] via SUBCUTANEOUS
  Filled 2014-12-31: qty 1
  Filled 2014-12-31 (×4): qty 2
  Filled 2014-12-31: qty 5

## 2014-12-31 MED ORDER — ONDANSETRON HCL 4 MG PO TABS: 4.0000 mg | ORAL_TABLET | Freq: Four times a day (QID) | ORAL | Status: DC | PRN

## 2014-12-31 MED ORDER — OXYCODONE HCL 5 MG PO TABS
5.0000 mg | ORAL_TABLET | ORAL | Status: DC | PRN
  Administered 2015-01-01 – 2015-01-03 (×6): 5 mg via ORAL
  Filled 2014-12-31 (×6): qty 1

## 2014-12-31 MED ORDER — HEPARIN SODIUM (PORCINE) 5000 UNIT/ML IJ SOLN
5000.0000 [IU] | Freq: Three times a day (TID) | INTRAMUSCULAR | Status: DC
  Administered 2014-12-31 – 2015-01-02 (×5): 5000 [IU] via SUBCUTANEOUS
  Filled 2014-12-31 (×5): qty 1

## 2014-12-31 MED ORDER — INSULIN ASPART 100 UNIT/ML ~~LOC~~ SOLN: 0.0000 [IU] | Freq: Every day | SUBCUTANEOUS | Status: DC

## 2014-12-31 MED ORDER — ASPIRIN EC 81 MG PO TBEC
81.0000 mg | DELAYED_RELEASE_TABLET | Freq: Every day | ORAL | Status: DC
  Administered 2015-01-01 – 2015-01-03 (×3): 81 mg via ORAL
  Filled 2014-12-31 (×3): qty 1

## 2014-12-31 MED ORDER — ACETAMINOPHEN 325 MG PO TABS: 650.0000 mg | ORAL_TABLET | Freq: Four times a day (QID) | ORAL | Status: DC | PRN

## 2014-12-31 MED ORDER — FUROSEMIDE 10 MG/ML IJ SOLN
60.0000 mg | Freq: Once | INTRAMUSCULAR | Status: AC
  Administered 2014-12-31: 60 mg via INTRAVENOUS
  Filled 2014-12-31: qty 8

## 2014-12-31 MED ORDER — DIAZEPAM 5 MG PO TABS
5.0000 mg | ORAL_TABLET | Freq: Three times a day (TID) | ORAL | Status: DC | PRN
  Administered 2014-12-31 – 2015-01-02 (×4): 5 mg via ORAL
  Filled 2014-12-31 (×4): qty 1

## 2014-12-31 MED ORDER — ACETAMINOPHEN 650 MG RE SUPP: 650.0000 mg | Freq: Four times a day (QID) | RECTAL | Status: DC | PRN

## 2014-12-31 MED ORDER — POTASSIUM CHLORIDE CRYS ER 10 MEQ PO TBCR
10.0000 meq | EXTENDED_RELEASE_TABLET | Freq: Two times a day (BID) | ORAL | Status: DC
  Administered 2014-12-31 – 2015-01-03 (×6): 10 meq via ORAL
  Filled 2014-12-31 (×6): qty 1

## 2014-12-31 MED ORDER — SODIUM CHLORIDE 0.9 % IJ SOLN
3.0000 mL | Freq: Two times a day (BID) | INTRAMUSCULAR | Status: DC
  Administered 2015-01-01 – 2015-01-03 (×5): 3 mL via INTRAVENOUS

## 2014-12-31 MED ORDER — AMLODIPINE BESYLATE 10 MG PO TABS
10.0000 mg | ORAL_TABLET | Freq: Every day | ORAL | Status: DC
  Administered 2015-01-01 – 2015-01-03 (×3): 10 mg via ORAL
  Filled 2014-12-31 (×3): qty 1

## 2014-12-31 MED ORDER — FUROSEMIDE 10 MG/ML IJ SOLN
40.0000 mg | Freq: Every day | INTRAMUSCULAR | Status: DC
  Administered 2014-12-31: 40 mg via INTRAVENOUS
  Filled 2014-12-31: qty 4

## 2014-12-31 NOTE — ED Provider Notes (Signed)
[facility] Emergency Department Provider Note  ____________________________________________  Time seen: 7:30 PM  I have reviewed the triage vital signs and the nursing notes.   HISTORY  Chief Complaint Shortness of Breath   HPI [name] is a 44 y.o. female who presents with complaints of shortness of breath. She reports that she feels okay when she is at rest but any movement at all makes her exhausted and short of breath. She denies fevers chills. No cough. She was recently diagnosed with fluid around the heart. She does admit to being compliant with her Lasix. She becomes more short of breath when she lies flat. She denies chest pain. She does have some peripheral edema. She reports this is no worse than usual for her     Past Medical History  Diagnosis Date  . Poorly controlled diabetes mellitus ([place])     a. 11/2014 A1c 13.2.  . Iron deficiency anemia   . Asthma   . Hypertension   . Pericardial effusion     a. 11/2014 CT Chest: Mod effusion.  . Diabetes mellitus without complication [facility])     Patient Active Problem List   Diagnosis Date Noted  . Elevated troponin 12/18/2014  . Hypertensive heart disease with congestive heart failure ([place]) 12/18/2014  . Acute diastolic heart failure ([place]) 12/17/2014  . Pericardial effusion 12/15/2014  . SOB (shortness of breath) 12/15/2014  . Accelerated hypertension with diastolic congestive heart failure, NYHA class 3 ([place]) 12/15/2014  . Diabetes ([name]) 12/15/2014    Past Surgical History  Procedure Laterality Date  . Cesarean section      Current Outpatient Rx  Name  Route  Sig  Dispense  Refill  . acetaminophen (TYLENOL) 325 MG tablet   Oral   Take 2 tablets (650 mg total) by mouth every 6 (six) hours as needed for mild pain (or Fever >/= 101).   1 tablet   0   . albuterol (PROVENTIL HFA;VENTOLIN HFA) 108 (90 BASE) MCG/ACT inhaler   Inhalation   Inhale 2 puffs into the lungs every 6 (six)  hours as needed for wheezing or shortness of breath.   1 Inhaler   0   . albuterol (PROVENTIL) (2.5 MG/3ML) 0.083% nebulizer solution   Nebulization   Take 3 mLs (2.5 mg total) by nebulization every 6 (six) hours as needed for wheezing.   75 mL   12   . amLODipine (NORVASC) 10 MG tablet   Oral   Take 1 tablet (10 mg total) by mouth daily.   30 tablet   0   . aspirin EC 81 MG EC tablet   Oral   Take 1 tablet (81 mg total) by mouth daily.   30 tablet   0   . budesonide (PULMICORT) 0.25 MG/2ML nebulizer solution   Nebulization   Take 2 mLs (0.25 mg total) by nebulization 2 (two) times daily.   60 mL   12   . diazepam (VALIUM) 5 MG tablet   Oral   Take 1 tablet (5 mg total) by mouth every 8 (eight) hours as needed for muscle spasms.   20 tablet   0   . furosemide (LASIX) 20 MG tablet   Oral   Take 1 tablet (20 mg total) by mouth 2 (two) times daily.   60 tablet   0   . hydrALAZINE (APRESOLINE) 100 MG tablet   Oral   Take 1 tablet (100 mg total) by mouth every 8 (eight) hours.  60 tablet   0   . ibuprofen (ADVIL,MOTRIN) 800 MG tablet   Oral   Take 1 tablet (800 mg total) by mouth every 8 (eight) hours as needed.   30 tablet   0   . insulin aspart (NOVOLOG) 100 UNIT/ML injection   Subcutaneous   Inject 60 Units into the skin 2 (two) times daily.   10 mL   11   . insulin aspart protamine- aspart (NOVOLOG MIX 70/30) (70-30) 100 UNIT/ML injection   Subcutaneous   Inject 0.55 mLs (55 Units total) into the skin 2 (two) times daily with a meal.   10 mL   11   . Metoprolol Tartrate 75 MG TABS   Oral   Take 75 mg by mouth 2 (two) times daily.   60 tablet   0   . potassium chloride (K-DUR,KLOR-CON) 10 MEQ tablet   Oral   Take 1 tablet (10 mEq total) by mouth 2 (two) times daily.   30 tablet   0     Allergies Sulfa antibiotics  Family History  Problem Relation Age of Onset  . Hypertension    . Diabetes      Social History Social History   Substance Use Topics  . Smoking status: Never Smoker   . Smokeless tobacco: None  . Alcohol Use: Yes    Review of Systems  Constitutional: Negative for fever. Eyes: Negative for visual changes. ENT: Negative for sore throat Cardiovascular: Negative for chest pain. Respiratory: Positive short of breath Gastrointestinal: Negative for abdominal pain, vomiting and diarrhea. Genitourinary: Negative for dysuria. Musculoskeletal: Negative for back pain. Skin: Negative for rash. Neurological: Negative for headaches or focal weakness Psychiatric: Mild anxiety    ____________________________________________   PHYSICAL EXAM:  VITAL SIGNS: ED Triage Vitals  Enc Vitals Group     BP 12/31/14 1702 150/83 mmHg     Pulse Rate 12/31/14 1702 99     Resp 12/31/14 1702 19     Temp 12/31/14 1702 98.9 F (37.2 C)     Temp Source 12/31/14 1702 Oral     SpO2 12/31/14 1702 92 %     Weight 12/31/14 1702 325 lb (147.419 kg)     Height 12/31/14 1702 5\' 4"  (1.626 m)     Head Cir --      Peak Flow --      Pain Score 12/31/14 1706 6     Pain Loc --      Pain Edu? --      Excl. in [place]? --      Constitutional: Alert and oriented. Well appearing and in no distress. Pleasant and interactive, morbidly obese Eyes: Conjunctivae are normal.  ENT   Head: Normocephalic and atraumatic.   Mouth/Throat: Mucous membranes are moist. Cardiovascular: Normal rate, regular rhythm. Normal and symmetric distal pulses are present in all extremities. No murmurs, rubs, or gallops. Respiratory: Normal respiratory effort without tachypnea nor retractions. Rales bibasilarly Gastrointestinal: Soft and non-tender in all quadrants. No distention. There is no CVA tenderness. Genitourinary: deferred Musculoskeletal: Nontender with normal range of motion in all extremities. 1+ lower extremity edema Neurologic:  Normal speech and language. No gross focal neurologic deficits are appreciated. Skin:  Skin is warm, dry  and intact. No rash noted. Psychiatric: Mood and affect are normal. Patient exhibits appropriate insight and judgment.  ____________________________________________    LABS (pertinent positives/negatives)  Labs Reviewed  CBC - Abnormal; Notable for the following:    Hemoglobin 9.4 (*)    HCT  30.4 (*)    MCV 68.7 (*)    MCH 21.3 (*)    MCHC 31.1 (*)    RDW 16.7 (*)    Platelets 443 (*)    All other components within normal limits  COMPREHENSIVE METABOLIC PANEL - Abnormal; Notable for the following:    Sodium 134 (*)    Glucose, Bld 235 (*)    Calcium 8.4 (*)    Total Protein 6.2 (*)    Albumin 2.7 (*)    Total Bilirubin 0.2 (*)    Anion gap 3 (*)    All other components within normal limits  BRAIN NATRIURETIC PEPTIDE - Abnormal; Notable for the following:    B Natriuretic Peptide 127.0 (*)    All other components within normal limits  TROPONIN I    ____________________________________________   EKG  ED ECG REPORT I, Lavonia Drafts, the attending physician, personally viewed and interpreted this ECG.  Date: 12/31/2014 EKG Time: 5:25 PM Rate: 97 Rhythm: normal sinus rhythm QRS Axis: normal Intervals: normal ST/T Wave abnormalities: normal Conduction Disutrbances: none Narrative Interpretation: unremarkable   ____________________________________________    RADIOLOGY I have personally reviewed any xrays that were ordered on this patient: Mild pulmonary edema on chest x-ray  ____________________________________________   PROCEDURES  Procedure(s) performed: none  Critical Care performed: none  ____________________________________________   INITIAL IMPRESSION / ASSESSMENT AND PLAN / ED COURSE  Pertinent labs & imaging results that were available during my care of the patient were reviewed by me and considered in my medical decision making (see chart for details).  Patient presents with shortness of breath, tachypnea and chest x-ray consistent with  pulmonary edema. Her BNP is mildly elevated. She is compliant with Lasix but is having worsening shortness of breath. Given her pulmonary edema patient is to be admitted to the hospital for diuresis. We'll give Lasix 60 mg IV in the emergency department  I'll discuss with hospitalist  ____________________________________________   FINAL CLINICAL IMPRESSION(S) / ED DIAGNOSES  Final diagnoses:  Acute on chronic congestive heart failure, unspecified congestive heart failure type ([place])     Lavonia Drafts, MD 12/31/14 2004

## 2014-12-31 NOTE — H&P (Signed)
[place] [facility] [place] NAME: [name]    MR#:  [id number]  DATE OF BIRTH:  1970-12-04   DATE OF ADMISSION:  12/31/2014  PRIMARY CARE PHYSICIAN: Kasandra Knudsen, NP   REQUESTING/REFERRING PHYSICIAN: [place]  CHIEF COMPLAINT:   Chief Complaint  Patient presents with  . Shortness of Breath    HISTORY OF PRESENT ILLNESS:  [name]  is a 44 y.o. female with a known history of diastolic congestive heart failure) with shortness of breath. She is complaining of shortness of breath mainly has dyspnea on exertion which is been progressively worsening over the past 2-3 days she has associated lower extremity edema as well as progressively worsening orthopnea. She also attests to having a cough occasionally productive of sputum however denies any fevers or chills, chest pain. Of note recently discharged from the hospital discharge diagnosis acute on chronic diastolic congestive heart failure her symptoms originally improved but now worsening again.  PAST MEDICAL HISTORY:   Past Medical History  Diagnosis Date  . Poorly controlled diabetes mellitus ([place])     a. 11/2014 A1c 13.2.  . Iron deficiency anemia   . Asthma   . Hypertension   . Pericardial effusion     a. 11/2014 CT Chest: Mod effusion.  . Diabetes mellitus without complication ([place])   . CHF (congestive heart failure) ([place])     PAST SURGICAL HISTORY:   Past Surgical History  Procedure Laterality Date  . Cesarean section      SOCIAL HISTORY:   Social History  Substance Use Topics  . Smoking status: Never Smoker   . Smokeless tobacco: Not on file  . Alcohol Use: Yes    FAMILY HISTORY:   Family History  Problem Relation Age of Onset  . Hypertension    . Diabetes      DRUG ALLERGIES:   Allergies  Allergen Reactions  . Sulfa Antibiotics Itching    REVIEW OF SYSTEMS:  REVIEW OF SYSTEMS:  CONSTITUTIONAL: Denies fevers, chills, positive fatigue,  weakness.  EYES: Denies blurred vision, double vision, or eye pain.  EARS, NOSE, THROAT: Denies tinnitus, ear pain, hearing loss.  RESPIRATORY: Positive cough, shortness of breath, denies wheezing  CARDIOVASCULAR: Denies chest pain, palpitations, positive edema.  GASTROINTESTINAL: Denies nausea, vomiting, diarrhea, abdominal pain.  GENITOURINARY: Denies dysuria, hematuria.  ENDOCRINE: Denies nocturia or thyroid problems. HEMATOLOGIC AND LYMPHATIC: Denies easy bruising or bleeding.  SKIN: Denies rash or lesions.  MUSCULOSKELETAL: Denies pain in neck, back, shoulder, knees, hips, or further arthritic symptoms.  NEUROLOGIC: Denies paralysis, paresthesias.  PSYCHIATRIC: Denies anxiety or depressive symptoms. Otherwise full review of systems performed by me is negative.   MEDICATIONS AT HOME:   Prior to Admission medications   Medication Sig Start Date End Date Taking? Authorizing Provider  acetaminophen (TYLENOL) 325 MG tablet Take 2 tablets (650 mg total) by mouth every 6 (six) hours as needed for mild pain (or Fever >/= 101). 12/19/14   Dustin Flock, MD  albuterol (PROVENTIL HFA;VENTOLIN HFA) 108 (90 BASE) MCG/ACT inhaler Inhale 2 puffs into the lungs every 6 (six) hours as needed for wheezing or shortness of breath. 12/19/14   Dustin Flock, MD  albuterol (PROVENTIL) (2.5 MG/3ML) 0.083% nebulizer solution Take 3 mLs (2.5 mg total) by nebulization every 6 (six) hours as needed for wheezing. 12/19/14   Dustin Flock, MD  amLODipine (NORVASC) 10 MG tablet Take 1 tablet (10 mg total) by mouth daily. 12/19/14   Dustin Flock, MD  aspirin EC 81 MG EC tablet Take 1 tablet (81 mg total) by mouth daily. 12/19/14   Dustin Flock, MD  budesonide (PULMICORT) 0.25 MG/2ML nebulizer solution Take 2 mLs (0.25 mg total) by nebulization 2 (two) times daily. 12/19/14   Dustin Flock, MD  diazepam (VALIUM) 5 MG tablet Take 1 tablet (5 mg total) by mouth every 8 (eight) hours as needed for muscle spasms.  12/19/14   Dustin Flock, MD  furosemide (LASIX) 20 MG tablet Take 1 tablet (20 mg total) by mouth 2 (two) times daily. 12/19/14   Dustin Flock, MD  hydrALAZINE (APRESOLINE) 100 MG tablet Take 1 tablet (100 mg total) by mouth every 8 (eight) hours. 12/19/14   Dustin Flock, MD  ibuprofen (ADVIL,MOTRIN) 800 MG tablet Take 1 tablet (800 mg total) by mouth every 8 (eight) hours as needed. 10/28/14   Earleen Newport, MD  insulin aspart (NOVOLOG) 100 UNIT/ML injection Inject 60 Units into the skin 2 (two) times daily. 12/19/14   Dustin Flock, MD  insulin aspart protamine- aspart (NOVOLOG MIX 70/30) (70-30) 100 UNIT/ML injection Inject 0.55 mLs (55 Units total) into the skin 2 (two) times daily with a meal. 12/19/14   Dustin Flock, MD  Metoprolol Tartrate 75 MG TABS Take 75 mg by mouth 2 (two) times daily. 12/19/14   Dustin Flock, MD  potassium chloride (K-DUR,KLOR-CON) 10 MEQ tablet Take 1 tablet (10 mEq total) by mouth 2 (two) times daily. 12/19/14   Dustin Flock, MD      VITAL SIGNS:  Blood pressure 126/91, pulse 75, temperature 98.9 F (37.2 C), temperature source Oral, resp. rate 19, height 5\' 4"  (1.626 m), weight 325 lb (147.419 kg), last menstrual period 12/15/2014, SpO2 95 %.  PHYSICAL EXAMINATION:  VITAL SIGNS: Filed Vitals:   12/31/14 2043  BP: 126/91  Pulse: 75  Temp:   Resp: 6   GENERAL:44 y.o.female currently in no acute distress. Obese HEAD: Normocephalic, atraumatic.  EYES: Pupils equal, round, reactive to light. Extraocular muscles intact. No scleral icterus.  MOUTH: Moist mucosal membrane. Dentition intact. No abscess noted.  EAR, NOSE, THROAT: Clear without exudates. No external lesions.  NECK: Supple. No thyromegaly. No nodules. No JVD.  PULMONARY: Diminished breath sounds secondary to body habitus however no frank wheeze or rhonchi No use of accessory muscles, Good respiratory effort. Poor air entry bilaterally CHEST: Nontender to palpation.   CARDIOVASCULAR: S1 and S2. Regular rate and rhythm. No murmurs, rubs, or gallops. 2+ edema to the knees. Pedal pulses 2+ bilaterally.  GASTROINTESTINAL: Soft, nontender, nondistended. No masses. Positive bowel sounds. No hepatosplenomegaly.  MUSCULOSKELETAL: No swelling, clubbing, or edema. Range of motion full in all extremities.  NEUROLOGIC: Cranial nerves II through XII are intact. No gross focal neurological deficits. Sensation intact. Reflexes intact.  SKIN: No ulceration, lesions, rashes, or cyanosis. Skin warm and dry. Turgor intact.  PSYCHIATRIC: Mood, affect within normal limits. The patient is awake, alert and oriented x 3. Insight, judgment intact.    LABORATORY PANEL:   CBC  Recent Labs Lab 12/31/14 1746  WBC 7.6  HGB 9.4*  HCT 30.4*  PLT 443*   ------------------------------------------------------------------------------------------------------------------  Chemistries   Recent Labs Lab 12/31/14 1746  NA 134*  K 4.2  CL 101  CO2 30  GLUCOSE 235*  BUN 11  CREATININE 0.84  CALCIUM 8.4*  AST 16  ALT 17  ALKPHOS 88  BILITOT 0.2*   ------------------------------------------------------------------------------------------------------------------  Cardiac Enzymes  Recent Labs Lab 12/31/14 1746  TROPONINI 0.03   ------------------------------------------------------------------------------------------------------------------  RADIOLOGY:  Dg Chest 2 View  12/31/2014  CLINICAL DATA:  Shortness of breath EXAM: CHEST  2 VIEW COMPARISON:  12/15/2014 chest radiograph FINDINGS: Stable cardiomediastinal silhouette with mild cardiomegaly. No pneumothorax. No pleural effusion. Mild pulmonary edema. There is focal curvilinear opacity in the right middle lobe. IMPRESSION: 1. Mild cardiomegaly with mild pulmonary edema, most in keeping with mild congestive heart failure. 2. Focal curvilinear opacity in the right middle lobe, favor atelectasis. Electronically Signed    By: Ilona Sorrel M.D.   On: 12/31/2014 18:36    EKG:   Orders placed or performed during the hospital encounter of 12/31/14  . ED EKG  . ED EKG  . EKG 12-Lead  . EKG 12-Lead    IMPRESSION AND PLAN:   44 year old African American female history of diastolic and his heart failure as well as type 2 diabetes insulin frank shortness breath  1. Acute on chronic diastolic congestive heart failure: Place on telemetry trend cardiac enzymes, diuresis Lasix, daily weights, follow strict ins and outs, supplemental oxygen as required, DuoNeb treatments as required continue beta-blockade, add ACE inhibitor as not already added will consult cardiology 2. Type 2 diabetes insulin requiring: Continue basal insulin at insulin sliding scale 3. Essential hypertension: Continue with Norvasc beta-blockade add lisinopril 4. Venous thromboembolism prophylactic: Heparin subcutaneous  All the records are reviewed and case discussed with ED provider. Management plans discussed with the patient, family and they are in agreement.  CODE STATUS: Full  TOTAL TIME TAKING CARE OF THIS PATIENT: 35 minutes.    Hower,  Karenann Cai.D on 12/31/2014 at 8:46 PM  Between 7am to 6pm - Pager - [phone]  After 6pm: House Pager: - [phone]  [facility]  Office  [phone]  CC: Primary care physician; Kasandra Knudsen, NP

## 2014-12-31 NOTE — ED Notes (Signed)
Patient states she remained short of breath since leaving the hospital on 10/24. Patient c/o increased shortness of breath with movement and is interfering with her daily life. Patient c/o feeling "groggy" and weak.

## 2015-01-01 DIAGNOSIS — I5033 Acute on chronic diastolic (congestive) heart failure: Secondary | ICD-10-CM

## 2015-01-01 LAB — BASIC METABOLIC PANEL
Anion gap: 3 — ABNORMAL LOW (ref 5–15)
BUN: 14 mg/dL (ref 6–20)
CHLORIDE: 99 mmol/L — AB (ref 101–111)
CO2: 32 mmol/L (ref 22–32)
CREATININE: 0.88 mg/dL (ref 0.44–1.00)
Calcium: 8.3 mg/dL — ABNORMAL LOW (ref 8.9–10.3)
GFR calc Af Amer: >60 mL/min (ref >60)
GFR calc non Af Amer: >60 mL/min (ref >60)
GLUCOSE: 254 mg/dL — AB (ref 65–99)
Potassium: 4.2 mmol/L (ref 3.5–5.1)
Sodium: 134 mmol/L — ABNORMAL LOW (ref 135–145)

## 2015-01-01 LAB — TROPONIN I
Troponin I: <0.03 ng/mL (ref <0.031)
Troponin I: <0.03 ng/mL (ref <0.031)

## 2015-01-01 LAB — GLUCOSE, CAPILLARY
Glucose-Capillary: 152 mg/dL — ABNORMAL HIGH (ref 65–99)
Glucose-Capillary: 167 mg/dL — ABNORMAL HIGH (ref 65–99)
Glucose-Capillary: 90 mg/dL (ref 65–99)

## 2015-01-01 MED ORDER — LISINOPRIL 10 MG PO TABS
10.0000 mg | ORAL_TABLET | Freq: Every day | ORAL | Status: DC
  Filled 2015-01-01: qty 1

## 2015-01-01 MED ORDER — LOSARTAN POTASSIUM 50 MG PO TABS
50.0000 mg | ORAL_TABLET | Freq: Every day | ORAL | Status: DC
  Administered 2015-01-02 – 2015-01-03 (×2): 50 mg via ORAL
  Filled 2015-01-01 (×2): qty 1

## 2015-01-01 MED ORDER — CLONIDINE HCL 0.1 MG PO TABS
0.2000 mg | ORAL_TABLET | Freq: Two times a day (BID) | ORAL | Status: DC
  Administered 2015-01-01: 0.2 mg via ORAL
  Filled 2015-01-01: qty 2

## 2015-01-01 MED ORDER — LISINOPRIL 20 MG PO TABS: 20.0000 mg | ORAL_TABLET | Freq: Every day | ORAL | Status: DC

## 2015-01-01 MED ORDER — FUROSEMIDE 10 MG/ML IJ SOLN
40.0000 mg | Freq: Two times a day (BID) | INTRAMUSCULAR | Status: DC
  Administered 2015-01-01 (×2): 40 mg via INTRAVENOUS
  Filled 2015-01-01 (×2): qty 4

## 2015-01-01 NOTE — Progress Notes (Signed)
[place] [facility]                                                                                                                                                                                            Patient Demographics   [name], is a 44 y.o. female, DOB - 16-Sep-1970, [id number]  Admit date - 12/31/2014   Admitting Physician Lytle Butte, MD  Outpatient Primary MD for the patient is HOLLAND, CHELSA, NP   LOS - 1  Subjective: Patient admitted with shortness of breath and weakness. I know her from recent hospitalization. She has diuresed well since admission. Feeling better. She reports that she's been taking her medications as scheduled. During her recent hospitalization patient had some issues with ACE inhibitor and cough.     Review of Systems:   CONSTITUTIONAL: No documented fever. No fatigue, weakness. No weight gain, no weight loss.  EYES: No blurry or double vision.  ENT: No tinnitus. No postnasal drip. No redness of the oropharynx.  RESPIRATORY: No cough, no wheeze, no hemoptysis. Positive dyspnea.  CARDIOVASCULAR: No chest pain. No orthopnea. No palpitations. No syncope.  GASTROINTESTINAL: No nausea, no vomiting or diarrhea. No abdominal pain. No melena or hematochezia.  GENITOURINARY: No dysuria or hematuria.  ENDOCRINE: No polyuria or nocturia. No heat or cold intolerance.  HEMATOLOGY: No anemia. No bruising. No bleeding.  INTEGUMENTARY: No rashes. No lesions.  MUSCULOSKELETAL: No arthritis. No swelling. No gout.  NEUROLOGIC: No numbness, tingling, or ataxia. No seizure-type activity.  PSYCHIATRIC: No anxiety. No insomnia. No ADD.    Vitals:   Filed Vitals:   01/01/15 0422 01/01/15 0500 01/01/15 1108 01/01/15 1116  BP: 163/96   138/73  Pulse: 84   88  Temp: 98.7 F (37.1 C)   98.4 F (36.9 C)  TempSrc: Oral   Oral  Resp:    20  Height:      Weight:  151.3 kg (333 lb 8.9 oz)    SpO2: 95%  96% 94%    Wt  Readings from Last 3 Encounters:  01/01/15 151.3 kg (333 lb 8.9 oz)  12/19/14 147.918 kg (326 lb 1.6 oz)  11/20/14 145.151 kg (320 lb)     Intake/Output Summary (Last 24 hours) at 01/01/15 1316 Last data filed at 01/01/15 1100  Gross per 24 hour  Intake      0 ml  Output   1850 ml  Net  -1850 ml    Physical Exam:   GENERAL: Pleasant-appearing in no apparent distress.  HEAD, EYES, EARS, NOSE AND THROAT: Atraumatic, normocephalic. Extraocular muscles are intact. Pupils  equal and reactive to light. Sclerae anicteric. No conjunctival injection. No oro-pharyngeal erythema.  NECK: Supple. There is no jugular venous distention. No bruits, no lymphadenopathy, no thyromegaly.  HEART: Regular rate and rhythm,. No murmurs, no rubs, no clicks.  LUNGS: Occasional crackles ABDOMEN: Soft, flat, nontender, nondistended. Has good bowel sounds. No hepatosplenomegaly appreciated.  EXTREMITIES: No evidence of any cyanosis, clubbing, and 1+ peripheral edema.  2 + pedal and radial pulses bilaterally.  NEUROLOGIC: The patient is alert, awake, and oriented x3 with no focal motor or sensory deficits appreciated bilaterally.  SKIN: Moist and warm with no rashes appreciated.  Psych: Not anxious, depressed LN: No inguinal LN enlargement    Antibiotics   Anti-infectives    None      Medications   Scheduled Meds: . amLODipine  10 mg Oral Daily  . aspirin EC  81 mg Oral Daily  . budesonide  0.25 mg Nebulization BID  . furosemide  40 mg Intravenous Q12H  . heparin  5,000 Units Subcutaneous 3 times per day  . hydrALAZINE  100 mg Oral 3 times per day  . insulin aspart  0-5 Units Subcutaneous QHS  . insulin aspart  0-9 Units Subcutaneous TID WC  . insulin aspart protamine- aspart  55 Units Subcutaneous BID WC  . lisinopril  10 mg Oral Daily  . metoprolol  75 mg Oral BID  . potassium chloride  10 mEq Oral BID  . sodium chloride  3 mL Intravenous Q12H   Continuous Infusions:  PRN  Meds:.acetaminophen **OR** acetaminophen, diazepam, ipratropium-albuterol, morphine injection, ondansetron **OR** ondansetron (ZOFRAN) IV, oxyCODONE   Data Review:   Micro Results No results found for this or any previous visit (from the past 240 hour(s)).  Radiology Reports Dg Chest 2 View  12/31/2014  CLINICAL DATA:  Shortness of breath EXAM: CHEST  2 VIEW COMPARISON:  12/15/2014 chest radiograph FINDINGS: Stable cardiomediastinal silhouette with mild cardiomegaly. No pneumothorax. No pleural effusion. Mild pulmonary edema. There is focal curvilinear opacity in the right middle lobe. IMPRESSION: 1. Mild cardiomegaly with mild pulmonary edema, most in keeping with mild congestive heart failure. 2. Focal curvilinear opacity in the right middle lobe, favor atelectasis. Electronically Signed   By: Ilona Sorrel M.D.   On: 12/31/2014 18:36   Dg Chest 2 View  12/15/2014  CLINICAL DATA:  Shortness of Breath EXAM: CHEST  2 VIEW COMPARISON:  11/20/2014 FINDINGS: Cardiomediastinal silhouette is stable. There is streaky right base medially atelectasis or early infiltrate. No pulmonary edema. IMPRESSION: Streaky right base medially atelectasis or early infiltrate. No pulmonary edema. Electronically Signed   By: Lahoma Crocker M.D.   On: 12/15/2014 09:46   Ct Angio Chest Pe W/cm &/or Wo Cm  12/15/2014  CLINICAL DATA:  Severe dyspnea EXAM: CT ANGIOGRAPHY CHEST WITH CONTRAST TECHNIQUE: Multidetector CT imaging of the chest was performed using the standard protocol during bolus administration of intravenous contrast. Multiplanar CT image reconstructions and MIPs were obtained to evaluate the vascular anatomy. CONTRAST:  162mL OMNIPAQUE IOHEXOL 350 MG/ML SOLN COMPARISON:  None. FINDINGS: THORACIC INLET/BODY WALL: No acute abnormality. MEDIASTINUM: Mild cardiomegaly with a moderate low-density pericardial effusion without serosal enhancement or nodularity. The fluid has increased from abdominal CT 10/28/2014. No  straightening of the interventricular septum or free wall collapse. Scan is essentially free breathing, limiting evaluation of the pulmonary arteries. No evidence of pulmonary embolism, with sensitivity limited to the segmental level and proximal. No suspicious lymph nodes. Negative aorta. LUNG WINDOWS: Diffuse bronchial wall thickening with  scattered subsegmental atelectasis. Small pleural effusions without pulmonary edema. UPPER ABDOMEN: No acute findings. OSSEOUS: No acute fracture. No suspicious lytic or blastic lesions. Thoracic dextroscoliosis. Review of the MIP images confirms the above findings. IMPRESSION: 1. CTA of the pulmonary arteries is limited by motion. No evidence of pulmonary embolism to the segmental level. 2. Bronchitis with subsegmental atelectasis. 3. Moderate pericardial effusion, increased from abdominal CT 10/28/2014, and trace pleural effusions. Electronically Signed   By: Monte Fantasia M.D.   On: 12/15/2014 11:20     CBC  Recent Labs Lab 12/31/14 1746  WBC 7.6  HGB 9.4*  HCT 30.4*  PLT 443*  MCV 68.7*  MCH 21.3*  MCHC 31.1*  RDW 16.7*    Chemistries   Recent Labs Lab 12/31/14 1746 01/01/15 0557  NA 134* 134*  K 4.2 4.2  CL 101 99*  CO2 30 32  GLUCOSE 235* 254*  BUN 11 14  CREATININE 0.84 0.88  CALCIUM 8.4* 8.3*  AST 16  --   ALT 17  --   ALKPHOS 88  --   BILITOT 0.2*  --    ------------------------------------------------------------------------------------------------------------------ estimated creatinine clearance is 121.4 mL/min (by C-G formula based on Cr of 0.88). ------------------------------------------------------------------------------------------------------------------ No results for input(s): HGBA1C in the last 72 hours. ------------------------------------------------------------------------------------------------------------------ No results for input(s): CHOL, HDL, LDLCALC, TRIG, CHOLHDL, LDLDIRECT in the last 72  hours. ------------------------------------------------------------------------------------------------------------------ No results for input(s): TSH, T4TOTAL, T3FREE, THYROIDAB in the last 72 hours.  Invalid input(s): FREET3 ------------------------------------------------------------------------------------------------------------------ No results for input(s): VITAMINB12, FOLATE, FERRITIN, TIBC, IRON, RETICCTPCT in the last 72 hours.  Coagulation profile No results for input(s): INR, PROTIME in the last 168 hours.  No results for input(s): DDIMER in the last 72 hours.  Cardiac Enzymes  Recent Labs Lab 12/31/14 2306 01/01/15 0557 01/01/15 1158  TROPONINI 0.04* <0.03 <0.03   ------------------------------------------------------------------------------------------------------------------ Invalid input(s): POCBNP    Assessment & Plan   44 year old African American female history of diastolic and his heart failure as well as type 2 diabetes insulin frank shortness breath  1. Acute on chronic diastolic congestive heart failure: Change Lasix to IV twice a day she's responded well. Will need discharge increased oral Lasix at discharge 2. Type 2 diabetes insulin requiring: Continue basal insulin at insulin sliding scale blood glucose stable 3. Essential hypertension: Continue with Norvasc beta-blockade , patient has issues with him ACE inhibitor will place her on ARB 4. Venous thromboembolism prophylactic: Heparin subcutaneousHCC)      Code Status Orders        Start     Ordered   12/31/14 2026  Full code   Continuous     12/31/14 2025           Consults  cardiology DVT Prophylaxis  heparin  Lab Results  Component Value Date   PLT 443* 12/31/2014     Time Spent in minutes  45 minutes  Dustin Flock M.D on 01/01/2015 at 1:16 PM  Between 7am to 6pm - Pager - [phone]  After 6pm go to [email or web address] - password [email or web address] [facility]    Office  [phone]

## 2015-01-01 NOTE — Progress Notes (Signed)
Patient resting quietly today. Pain relieved by PRN medications. No other complaints. Independent in the room. Refuses to wear non-slip socks - educated on safety. Sinus Rhythm HR 80's today. Seen by cardiologist today.

## 2015-01-01 NOTE — Progress Notes (Signed)
Patient admitted to room 232 with a diagnosis of CHF. Alert and oriented x 4. Denied any pain. No acute respiratory distress noted. Dyspnea with exertion noted. Patient oriented to her room, staff and call bell/ascom. NSR on the heart monitor. Skin assessment done with Bertram Gala, RN, noted old  Healed scars on midline lower abdomen and another to the Left lower abdomen. Also noted small scars scattered all over the body (per patient those scars are from previous rash she got). Fall Neurosurgeon. Heart failure printed material given to the patient.   Second Troponin was 0.04, Dr. Lavetta Nielsen notified but no new order received. Will continue to monitor.

## 2015-01-01 NOTE — Progress Notes (Signed)
Pt's BP meds have been adjusted, pt already received previous order for clonidine. Per MD, don't give new dose of lisinopril. MD notified that patient was concerned about her BP meds. MD to speak with hosiptalist.

## 2015-01-01 NOTE — Consult Note (Addendum)
CARDIOLOGY CONSULT NOTE  Patient ID: [name], MRN: [id number], DOB/AGE: February 18, 1971 44 y.o. Admit date: 12/31/2014 Date of Consult: 01/01/2015  Primary Physician: Kasandra Knudsen, NP Primary Cardiologist: Dr Fletcher Anon Referring Physician: Dr Lavetta Nielsen  Chief Complaint: Shortness of breath  Reason for Consultation: Acute on chronic diastolic CHF  HPI: 44 yo morbidly obese woman with uncontrolled diabetes presents with recurrent symptoms of edema and shortness of breath. She was last hospitalized with diastolic heart failure [id number] through 12-19-2014. At that time she was admitted with volume overload, pericardial effusion, and interstitial pulmonary edema, all consistent with diastolic CHF in the setting of normal LV function by echo.  The patient, since discharge home, has continued to have shortness of breath with exertion. Her resting dyspnea, orthopnea, and PND are improved. Her edema is also improved. At the time of my interview, her primary complaint that brought her into the hospital was fatigue, lethargy, and generalized weakness. She was concerned that she was dehydrated. Limited resources for medication has been a long-standing issue. Her hemoglobin A1c recently was greater than 13. However, she reports full compliance with her medications since hospital discharge in October. She denies chest pain or pressure, fever, or chills. She does complain of sinus congestion and cough.  Medical History:  Past Medical History  Diagnosis Date  . Poorly controlled diabetes mellitus ([place])     a. 11/2014 A1c 13.2.  . Iron deficiency anemia   . Asthma   . Hypertension   . Pericardial effusion     a. 11/2014 CT Chest: Mod effusion.  . Diabetes mellitus without complication ([place])   . CHF (congestive heart failure) [facility])       Surgical History:  Past Surgical History  Procedure Laterality Date  . Cesarean section       Home Meds: Prior to Admission medications   Medication Sig Start Date  End Date Taking? Authorizing Provider  acetaminophen (TYLENOL) 325 MG tablet Take 2 tablets (650 mg total) by mouth every 6 (six) hours as needed for mild pain (or Fever >/= 101). 12/19/14  Yes Dustin Flock, MD  albuterol (PROVENTIL HFA;VENTOLIN HFA) 108 (90 BASE) MCG/ACT inhaler Inhale 2 puffs into the lungs every 6 (six) hours as needed for wheezing or shortness of breath. 12/19/14  Yes Dustin Flock, MD  albuterol (PROVENTIL) (2.5 MG/3ML) 0.083% nebulizer solution Take 3 mLs (2.5 mg total) by nebulization every 6 (six) hours as needed for wheezing. 12/19/14  Yes Dustin Flock, MD  amLODipine (NORVASC) 10 MG tablet Take 1 tablet (10 mg total) by mouth daily. 12/19/14  Yes Dustin Flock, MD  aspirin EC 81 MG EC tablet Take 1 tablet (81 mg total) by mouth daily. 12/19/14  Yes Shreyang Patel, MD  budesonide (PULMICORT) 0.25 MG/2ML nebulizer solution Take 2 mLs (0.25 mg total) by nebulization 2 (two) times daily. 12/19/14  Yes Dustin Flock, MD  clonazePAM (KLONOPIN) 2 MG tablet Take 2 mg by mouth at bedtime.   Yes Historical Provider, MD  diazepam (VALIUM) 5 MG tablet Take 1 tablet (5 mg total) by mouth every 8 (eight) hours as needed for muscle spasms. 12/19/14  Yes Dustin Flock, MD  furosemide (LASIX) 20 MG tablet Take 1 tablet (20 mg total) by mouth 2 (two) times daily. 12/19/14  Yes Dustin Flock, MD  hydrALAZINE (APRESOLINE) 100 MG tablet Take 1 tablet (100 mg total) by mouth every 8 (eight) hours. 12/19/14  Yes Dustin Flock, MD  insulin aspart protamine- aspart (NOVOLOG MIX 70/30) (70-30) 100 UNIT/ML  injection Inject 0.55 mLs (55 Units total) into the skin 2 (two) times daily with a meal. Patient taking differently: Inject 50 Units into the skin 2 (two) times daily with a meal.  12/19/14  Yes Dustin Flock, MD  Metoprolol Tartrate 75 MG TABS Take 75 mg by mouth 2 (two) times daily. 12/19/14  Yes Dustin Flock, MD  potassium chloride (K-DUR,KLOR-CON) 10 MEQ tablet Take 1 tablet (10 mEq  total) by mouth 2 (two) times daily. 12/19/14  Yes Dustin Flock, MD    Inpatient Medications:  . amLODipine  10 mg Oral Daily  . aspirin EC  81 mg Oral Daily  . budesonide  0.25 mg Nebulization BID  . cloNIDine  0.2 mg Oral BID  . furosemide  40 mg Intravenous Q12H  . heparin  5,000 Units Subcutaneous 3 times per day  . hydrALAZINE  100 mg Oral 3 times per day  . insulin aspart  0-5 Units Subcutaneous QHS  . insulin aspart  0-9 Units Subcutaneous TID WC  . insulin aspart protamine- aspart  55 Units Subcutaneous BID WC  . metoprolol  75 mg Oral BID  . potassium chloride  10 mEq Oral BID  . sodium chloride  3 mL Intravenous Q12H      Allergies:  Allergies  Allergen Reactions  . Sulfa Antibiotics Itching    Social History   Social History  . Marital Status: Married    Spouse Name: N/A  . Number of Children: N/A  . Years of Education: N/A   Occupational History  . Not on file.   Social History Main Topics  . Smoking status: Never Smoker   . Smokeless tobacco: Not on file  . Alcohol Use: Yes  . Drug Use: No  . Sexual Activity: Not on file   Other Topics Concern  . Not on file   Social History Narrative     Family History  Problem Relation Age of Onset  . Hypertension    . Diabetes       Review of Systems: General: positive for fatigue, weakness ENT: negative for rhinorrhea or epistaxis Cardiovascular: see HPI Dermatological: negative for rash Respiratory: positive for cough and wheezing GI: negative for nausea, vomiting, diarrhea, bright red blood per rectum, melena, or hematemesis GU: no hematuria, urgency, or frequency Neurologic: negative for visual changes, syncope, headache, or dizziness Heme: no easy bruising or bleeding Endo: negative for excessive thirst, thyroid disorder, or flushing Musculoskeletal: negative for joint pain or swelling, negative for myalgias  All other systems reviewed and are otherwise negative except as noted  above.  Physical Exam: Blood pressure 138/73, pulse 88, temperature 98.4 F (36.9 C), temperature source Oral, resp. rate 20, height 5\' 4"  (1.626 m), weight 333 lb 8.9 oz (151.3 kg), last menstrual period 12/15/2014, SpO2 94 %. Pt is alert and oriented, pleasant, morbidly obese woman, in no distress. HEENT: normal Neck: JVP normal. Carotid upstrokes normal without bruits. No thyromegaly. Lungs: equal expansion, clear bilaterally CV: Apex is not palpable, RRR without murmur or gallop Abd: soft, NT Back: no CVA tenderness Ext: trace pretibial edema        DP/PT pulses intact and = Skin: warm and dry without rash Neuro: CNII-XII intact             Strength intact = bilaterally    Labs:  Recent Labs  12/31/14 1746 12/31/14 2306 01/01/15 0557  TROPONINI 0.03 0.04* <0.03   Lab Results  Component Value Date   WBC 7.6 12/31/2014  HGB 9.4* 12/31/2014   HCT 30.4* 12/31/2014   MCV 68.7* 12/31/2014   PLT 443* 12/31/2014    Recent Labs Lab 12/31/14 1746 01/01/15 0557  NA 134* 134*  K 4.2 4.2  CL 101 99*  CO2 30 32  BUN 11 14  CREATININE 0.84 0.88  CALCIUM 8.4* 8.3*  PROT 6.2*  --   BILITOT 0.2*  --   ALKPHOS 88  --   ALT 17  --   AST 16  --   GLUCOSE 235* 254*   No results found for: CHOL, HDL, LDLCALC, TRIG No results found for: DDIMER  Radiology/Studies:  Dg Chest 2 View  12/31/2014  CLINICAL DATA:  Shortness of breath EXAM: CHEST  2 VIEW COMPARISON:  12/15/2014 chest radiograph FINDINGS: Stable cardiomediastinal silhouette with mild cardiomegaly. No pneumothorax. No pleural effusion. Mild pulmonary edema. There is focal curvilinear opacity in the right middle lobe. IMPRESSION: 1. Mild cardiomegaly with mild pulmonary edema, most in keeping with mild congestive heart failure. 2. Focal curvilinear opacity in the right middle lobe, favor atelectasis. Electronically Signed   By: Ilona Sorrel M.D.   On: 12/31/2014 18:36   Dg Chest 2 View  12/15/2014  CLINICAL DATA:   Shortness of Breath EXAM: CHEST  2 VIEW COMPARISON:  11/20/2014 FINDINGS: Cardiomediastinal silhouette is stable. There is streaky right base medially atelectasis or early infiltrate. No pulmonary edema. IMPRESSION: Streaky right base medially atelectasis or early infiltrate. No pulmonary edema. Electronically Signed   By: Lahoma Crocker M.D.   On: 12/15/2014 09:46   Ct Angio Chest Pe W/cm &/or Wo Cm  12/15/2014  CLINICAL DATA:  Severe dyspnea EXAM: CT ANGIOGRAPHY CHEST WITH CONTRAST TECHNIQUE: Multidetector CT imaging of the chest was performed using the standard protocol during bolus administration of intravenous contrast. Multiplanar CT image reconstructions and MIPs were obtained to evaluate the vascular anatomy. CONTRAST:  124mL OMNIPAQUE IOHEXOL 350 MG/ML SOLN COMPARISON:  None. FINDINGS: THORACIC INLET/BODY WALL: No acute abnormality. MEDIASTINUM: Mild cardiomegaly with a moderate low-density pericardial effusion without serosal enhancement or nodularity. The fluid has increased from abdominal CT 10/28/2014. No straightening of the interventricular septum or free wall collapse. Scan is essentially free breathing, limiting evaluation of the pulmonary arteries. No evidence of pulmonary embolism, with sensitivity limited to the segmental level and proximal. No suspicious lymph nodes. Negative aorta. LUNG WINDOWS: Diffuse bronchial wall thickening with scattered subsegmental atelectasis. Small pleural effusions without pulmonary edema. UPPER ABDOMEN: No acute findings. OSSEOUS: No acute fracture. No suspicious lytic or blastic lesions. Thoracic dextroscoliosis. Review of the MIP images confirms the above findings. IMPRESSION: 1. CTA of the pulmonary arteries is limited by motion. No evidence of pulmonary embolism to the segmental level. 2. Bronchitis with subsegmental atelectasis. 3. Moderate pericardial effusion, increased from abdominal CT 10/28/2014, and trace pleural effusions. Electronically Signed   By:  Monte Fantasia M.D.   On: 12/15/2014 11:20   EKG: NSR 97 bpm, within normal limits  Cardiac Studies: 2D Echo 12/15/2014: Study Conclusions - Procedure narrative: Transthoracic echocardiography. Image quality was suboptimal. - Left ventricle: The cavity size was normal. There was moderate concentric hypertrophy. Systolic function was normal. The estimated ejection fraction was in the range of 50% to 55%. Wall motion was normal; there were no regional wall motion abnormalities. Doppler parameters are consistent with abnormal left ventricular relaxation (grade 1 diastolic dysfunction). - Left atrium: The atrium was mildly dilated. - Pericardium, extracardiac: A small to moderate pericardial effusion was identified circumferential to the heart. There  was no evidence of hemodynamic compromise.  ASSESSMENT AND PLAN:  1. Acute on chronic diastolic CHF, NYHA Class III 2. Morbid obesity 3. Hyponatremia, mild 4. Uncontrolled diabetes 5. HTN with CHF 6. Fe-deficiency anemia, stable  The patient appears non-toxic. Possible that she is experiencing side effects of her complex medical regimen which is required to treat multiple medical problems. I think it is reasonable to continue IV diuretics today, but I suspect a significant component of her exertional dyspnea is related to morbid obesity and multiple medical problems. Recent echo and hospital records have been reviewed. I have reviewed the patient's medications. I would recommend discontinuation of clonidine and would start lisinopril 10 mg daily.  Anticipate change to oral furosemide tomorrow. Otherwise continue current medical therapy. She will follow-up with Dr Fletcher Anon post-discharge, but is still sorting through [facility] and trying to obtain coverage. There is an 11-22 appt on the books with Ignacia Bayley, NP.  Deatra James MD, [facility] 01/01/2015, 11:25 AM

## 2015-01-01 NOTE — Progress Notes (Signed)
0800 CBG 247 per glucometer (not synching with CHL)

## 2015-01-02 DIAGNOSIS — I5033 Acute on chronic diastolic (congestive) heart failure: Secondary | ICD-10-CM | POA: Diagnosis not present

## 2015-01-02 LAB — BASIC METABOLIC PANEL
Anion gap: 3 — ABNORMAL LOW (ref 5–15)
BUN: 22 mg/dL — AB (ref 6–20)
CHLORIDE: 100 mmol/L — AB (ref 101–111)
CO2: 30 mmol/L (ref 22–32)
Calcium: 8.1 mg/dL — ABNORMAL LOW (ref 8.9–10.3)
Creatinine, Ser: 1.14 mg/dL — ABNORMAL HIGH (ref 0.44–1.00)
GFR calc Af Amer: >60 mL/min (ref >60)
GFR calc non Af Amer: 58 mL/min — ABNORMAL LOW (ref >60)
GLUCOSE: 109 mg/dL — AB (ref 65–99)
POTASSIUM: 3.8 mmol/L (ref 3.5–5.1)
Sodium: 133 mmol/L — ABNORMAL LOW (ref 135–145)

## 2015-01-02 LAB — GLUCOSE, CAPILLARY
GLUCOSE-CAPILLARY: 96 mg/dL (ref 65–99)
GLUCOSE-CAPILLARY: 192 mg/dL — AB (ref 65–99)
GLUCOSE-CAPILLARY: 84 mg/dL (ref 65–99)
Glucose-Capillary: 165 mg/dL — ABNORMAL HIGH (ref 65–99)

## 2015-01-02 MED ORDER — ENOXAPARIN SODIUM 40 MG/0.4ML ~~LOC~~ SOLN
40.0000 mg | Freq: Two times a day (BID) | SUBCUTANEOUS | Status: DC
  Administered 2015-01-02 – 2015-01-03 (×2): 40 mg via SUBCUTANEOUS
  Filled 2015-01-02 (×2): qty 0.4

## 2015-01-02 MED ORDER — FUROSEMIDE 40 MG PO TABS
40.0000 mg | ORAL_TABLET | Freq: Two times a day (BID) | ORAL | Status: DC
  Administered 2015-01-02 – 2015-01-03 (×2): 40 mg via ORAL
  Filled 2015-01-02 (×2): qty 1

## 2015-01-02 NOTE — Progress Notes (Signed)
Patient: [name] / Admit Date: 12/31/2014 / Date of Encounter: 01/02/2015, 9:22 AM   Subjective: Feels much better today. Minus 3610 mL for the admission. No chest pain, palpitations, less SOB. SCr slightly bumped from 0.88-->1.14.  Review of Systems: Review of Systems  Constitutional: Positive for malaise/fatigue. Negative for fever, chills, weight loss and diaphoresis.  HENT: Positive for congestion.   Eyes: Negative for discharge and redness.  Respiratory: Positive for cough and shortness of breath. Negative for hemoptysis, sputum production and wheezing.   Cardiovascular: Positive for leg swelling. Negative for chest pain, palpitations, orthopnea, claudication and PND.  Gastrointestinal: Negative for nausea, vomiting and abdominal pain.  Musculoskeletal: Negative for falls.  Skin: Negative for rash.  Neurological: Positive for weakness. Negative for dizziness, sensory change, speech change, focal weakness and loss of consciousness.  Endo/Heme/Allergies: Does not bruise/bleed easily.  Psychiatric/Behavioral: Negative for substance abuse. The patient is not nervous/anxious.   All other systems reviewed and are negative.   Objective: Telemetry: NSR, 90's Physical Exam: Blood pressure 102/52, pulse 82, temperature 98.2 F (36.8 C), temperature source Oral, resp. rate 18, height 5\' 4"  (1.626 m), weight 337 lb 3.2 oz (152.953 kg), last menstrual period 12/15/2014, SpO2 90 %. Body mass index is 57.85 kg/(m^2). General: Well developed, well nourished, in no acute distress. Head: Normocephalic, atraumatic, sclera non-icteric, no xanthomas, nares are without discharge. Neck: Negative for carotid bruits. JVP not elevated. Lungs: Clear bilaterally to auscultation without wheezes, rales, or rhonchi. Breathing is unlabored. Heart: RRR S1 S2 without murmurs, rubs, or gallops.  Abdomen: Obese, soft, non-tender, non-distended with normoactive bowel sounds. No  rebound/guarding. Extremities: No clubbing or cyanosis. Trace pre-tibial edema. Distal pedal pulses are 2+ and equal bilaterally. Neuro: Alert and oriented X 3. Moves all extremities spontaneously. Psych:  Responds to questions appropriately with a normal affect.   Intake/Output Summary (Last 24 hours) at 01/02/15 0922 Last data filed at 01/02/15 0035  Gross per 24 hour  Intake    240 ml  Output   2000 ml  Net  -1760 ml    Inpatient Medications:  . amLODipine  10 mg Oral Daily  . aspirin EC  81 mg Oral Daily  . budesonide  0.25 mg Nebulization BID  . furosemide  40 mg Intravenous Q12H  . heparin  5,000 Units Subcutaneous 3 times per day  . hydrALAZINE  100 mg Oral 3 times per day  . insulin aspart  0-5 Units Subcutaneous QHS  . insulin aspart  0-9 Units Subcutaneous TID WC  . insulin aspart protamine- aspart  55 Units Subcutaneous BID WC  . losartan  50 mg Oral Daily  . metoprolol  75 mg Oral BID  . potassium chloride  10 mEq Oral BID  . sodium chloride  3 mL Intravenous Q12H   Infusions:    Labs:  Recent Labs  01/01/15 0557 01/02/15 0151  NA 134* 133*  K 4.2 3.8  CL 99* 100*  CO2 32 30  GLUCOSE 254* 109*  BUN 14 22*  CREATININE 0.88 1.14*  CALCIUM 8.3* 8.1*    Recent Labs  12/31/14 1746  AST 16  ALT 17  ALKPHOS 88  BILITOT 0.2*  PROT 6.2*  ALBUMIN 2.7*    Recent Labs  12/31/14 1746  WBC 7.6  HGB 9.4*  HCT 30.4*  MCV 68.7*  PLT 443*    Recent Labs  12/31/14 1746 12/31/14 2306 01/01/15 0557 01/01/15 1158  TROPONINI 0.03 0.04* <0.03 <0.03   Invalid  input(s): POCBNP No results for input(s): HGBA1C in the last 72 hours.   Weights: Filed Weights   01/01/15 0500 01/02/15 0500 01/02/15 0626  Weight: 333 lb 8.9 oz (151.3 kg) 338 lb 6.5 oz (153.5 kg) 337 lb 3.2 oz (152.953 kg)     Radiology/Studies:  Dg Chest 2 View  12/31/2014  CLINICAL DATA:  Shortness of breath EXAM: CHEST  2 VIEW COMPARISON:  12/15/2014 chest radiograph FINDINGS:  Stable cardiomediastinal silhouette with mild cardiomegaly. No pneumothorax. No pleural effusion. Mild pulmonary edema. There is focal curvilinear opacity in the right middle lobe. IMPRESSION: 1. Mild cardiomegaly with mild pulmonary edema, most in keeping with mild congestive heart failure. 2. Focal curvilinear opacity in the right middle lobe, favor atelectasis. Electronically Signed   By: Ilona Sorrel M.D.   On: 12/31/2014 18:36   Dg Chest 2 View  12/15/2014  CLINICAL DATA:  Shortness of Breath EXAM: CHEST  2 VIEW COMPARISON:  11/20/2014 FINDINGS: Cardiomediastinal silhouette is stable. There is streaky right base medially atelectasis or early infiltrate. No pulmonary edema. IMPRESSION: Streaky right base medially atelectasis or early infiltrate. No pulmonary edema. Electronically Signed   By: Lahoma Crocker M.D.   On: 12/15/2014 09:46   Ct Angio Chest Pe W/cm &/or Wo Cm  12/15/2014  CLINICAL DATA:  Severe dyspnea EXAM: CT ANGIOGRAPHY CHEST WITH CONTRAST TECHNIQUE: Multidetector CT imaging of the chest was performed using the standard protocol during bolus administration of intravenous contrast. Multiplanar CT image reconstructions and MIPs were obtained to evaluate the vascular anatomy. CONTRAST:  1100mL OMNIPAQUE IOHEXOL 350 MG/ML SOLN COMPARISON:  None. FINDINGS: THORACIC INLET/BODY WALL: No acute abnormality. MEDIASTINUM: Mild cardiomegaly with a moderate low-density pericardial effusion without serosal enhancement or nodularity. The fluid has increased from abdominal CT 10/28/2014. No straightening of the interventricular septum or free wall collapse. Scan is essentially free breathing, limiting evaluation of the pulmonary arteries. No evidence of pulmonary embolism, with sensitivity limited to the segmental level and proximal. No suspicious lymph nodes. Negative aorta. LUNG WINDOWS: Diffuse bronchial wall thickening with scattered subsegmental atelectasis. Small pleural effusions without pulmonary edema.  UPPER ABDOMEN: No acute findings. OSSEOUS: No acute fracture. No suspicious lytic or blastic lesions. Thoracic dextroscoliosis. Review of the MIP images confirms the above findings. IMPRESSION: 1. CTA of the pulmonary arteries is limited by motion. No evidence of pulmonary embolism to the segmental level. 2. Bronchitis with subsegmental atelectasis. 3. Moderate pericardial effusion, increased from abdominal CT 10/28/2014, and trace pleural effusions. Electronically Signed   By: Monte Fantasia M.D.   On: 12/15/2014 11:20     Assessment and Plan   1. Acute on chronic diastolic CHF, NYHA Class III: -Change from IV Lasix to PO Lasix 20 mg bid -Continue metoprolol 75 mg bid and losartan 50 mg daily -Possibly one more day given bump in renal function to assess outpatient diuretic   2. Morbid obesity: -Likely contributing to her symptoms  -PT  3. Hyponatremia: -Stable  4. DM2: -Uncontrolled -Per IM -Likely contributing to her symptoms  5. HTN with CHF: -Metoprolol 75 mg bid, amlodipine 10 mg daily, losartan 50 mg daily, and hydralazine 100 mg tid -Stable  6. Iron deficiency anemia: -Stable  7. Acute renal insufficiency: -Decrease Lasix as above   Melvern Banker, PA-C Pager: [phone] 01/02/2015, 9:22 AM

## 2015-01-02 NOTE — Progress Notes (Signed)
Patient rested quietly most of the day. Complaints of generalized pains improved by PRN medications. Medications adjusted today. Ok to give scheduled PO lasix this evening per Dr. Fletcher Anon - they will check labs tomorrow to help determine discharge plan.

## 2015-01-02 NOTE — Progress Notes (Signed)
[facility] [facility] at [facility]                                                                                                                                                                                            Patient Demographics   [name], is a 44 y.o. female, DOB - 01-22-1971, [id number]  Admit date - 12/31/2014   Admitting Physician Lytle Butte, MD  Outpatient Primary MD for the patient is HOLLAND, CHELSA, NP   LOS - 2  Subjective: Patient feeling better but her creatinine is slightly elevated on IV Lasix compared to yesterday shortness of breath improved     Review of Systems:   CONSTITUTIONAL: No documented fever. No fatigue, weakness. No weight gain, no weight loss.  EYES: No blurry or double vision.  ENT: No tinnitus. No postnasal drip. No redness of the oropharynx.  RESPIRATORY: No cough, no wheeze, no hemoptysis. Positive dyspnea.  CARDIOVASCULAR: No chest pain. No orthopnea. No palpitations. No syncope.  GASTROINTESTINAL: No nausea, no vomiting or diarrhea. No abdominal pain. No melena or hematochezia.  GENITOURINARY: No dysuria or hematuria.  ENDOCRINE: No polyuria or nocturia. No heat or cold intolerance.  HEMATOLOGY: No anemia. No bruising. No bleeding.  INTEGUMENTARY: No rashes. No lesions.  MUSCULOSKELETAL: No arthritis. No swelling. No gout.  NEUROLOGIC: No numbness, tingling, or ataxia. No seizure-type activity.  PSYCHIATRIC: No anxiety. No insomnia. No ADD.    Vitals:   Filed Vitals:   01/02/15 0745 01/02/15 1010 01/02/15 1125 01/02/15 1417  BP:  129/69 109/56   Pulse:  79 86   Temp:   98.7 F (37.1 C)   TempSrc:   Oral   Resp:   18   Height:      Weight:      SpO2: 90%  94% 90%    Wt Readings from Last 3 Encounters:  01/02/15 152.953 kg (337 lb 3.2 oz)  12/19/14 147.918 kg (326 lb 1.6 oz)  11/20/14 145.151 kg (320 lb)     Intake/Output Summary (Last 24 hours) at 01/02/15 1428 Last data filed at  01/02/15 1409  Gross per 24 hour  Intake    240 ml  Output   2500 ml  Net  -2260 ml    Physical Exam:   GENERAL: Pleasant-appearing in no apparent distress.  HEAD, EYES, EARS, NOSE AND THROAT: Atraumatic, normocephalic. Extraocular muscles are intact. Pupils equal and reactive to light. Sclerae anicteric. No conjunctival injection. No oro-pharyngeal erythema.  NECK: Supple. There is no jugular venous distention. No bruits, no lymphadenopathy, no thyromegaly.  HEART: Regular rate and rhythm,. No murmurs,  no rubs, no clicks.  LUNGS: Occasional crackles ABDOMEN: Soft, flat, nontender, nondistended. Has good bowel sounds. No hepatosplenomegaly appreciated.  EXTREMITIES: No evidence of any cyanosis, clubbing, and 1+ peripheral edema.  2 + pedal and radial pulses bilaterally.  NEUROLOGIC: The patient is alert, awake, and oriented x3 with no focal motor or sensory deficits appreciated bilaterally.  SKIN: Moist and warm with no rashes appreciated.  Psych: Not anxious, depressed LN: No inguinal LN enlargement    Antibiotics   Anti-infectives    None      Medications   Scheduled Meds: . amLODipine  10 mg Oral Daily  . aspirin EC  81 mg Oral Daily  . budesonide  0.25 mg Nebulization BID  . enoxaparin (LOVENOX) injection  40 mg Subcutaneous Q12H  . furosemide  40 mg Oral BID  . hydrALAZINE  100 mg Oral 3 times per day  . insulin aspart  0-5 Units Subcutaneous QHS  . insulin aspart  0-9 Units Subcutaneous TID WC  . insulin aspart protamine- aspart  55 Units Subcutaneous BID WC  . losartan  50 mg Oral Daily  . metoprolol  75 mg Oral BID  . potassium chloride  10 mEq Oral BID  . sodium chloride  3 mL Intravenous Q12H   Continuous Infusions:  PRN Meds:.acetaminophen **OR** acetaminophen, diazepam, ipratropium-albuterol, morphine injection, ondansetron **OR** ondansetron (ZOFRAN) IV, oxyCODONE   Data Review:   Micro Results No results found for this or any previous visit (from the  past 240 hour(s)).  Radiology Reports Dg Chest 2 View  12/31/2014  CLINICAL DATA:  Shortness of breath EXAM: CHEST  2 VIEW COMPARISON:  12/15/2014 chest radiograph FINDINGS: Stable cardiomediastinal silhouette with mild cardiomegaly. No pneumothorax. No pleural effusion. Mild pulmonary edema. There is focal curvilinear opacity in the right middle lobe. IMPRESSION: 1. Mild cardiomegaly with mild pulmonary edema, most in keeping with mild congestive heart failure. 2. Focal curvilinear opacity in the right middle lobe, favor atelectasis. Electronically Signed   By: Ilona Sorrel M.D.   On: 12/31/2014 18:36   Dg Chest 2 View  12/15/2014  CLINICAL DATA:  Shortness of Breath EXAM: CHEST  2 VIEW COMPARISON:  11/20/2014 FINDINGS: Cardiomediastinal silhouette is stable. There is streaky right base medially atelectasis or early infiltrate. No pulmonary edema. IMPRESSION: Streaky right base medially atelectasis or early infiltrate. No pulmonary edema. Electronically Signed   By: Lahoma Crocker M.D.   On: 12/15/2014 09:46   Ct Angio Chest Pe W/cm &/or Wo Cm  12/15/2014  CLINICAL DATA:  Severe dyspnea EXAM: CT ANGIOGRAPHY CHEST WITH CONTRAST TECHNIQUE: Multidetector CT imaging of the chest was performed using the standard protocol during bolus administration of intravenous contrast. Multiplanar CT image reconstructions and MIPs were obtained to evaluate the vascular anatomy. CONTRAST:  122mL OMNIPAQUE IOHEXOL 350 MG/ML SOLN COMPARISON:  None. FINDINGS: THORACIC INLET/BODY WALL: No acute abnormality. MEDIASTINUM: Mild cardiomegaly with a moderate low-density pericardial effusion without serosal enhancement or nodularity. The fluid has increased from abdominal CT 10/28/2014. No straightening of the interventricular septum or free wall collapse. Scan is essentially free breathing, limiting evaluation of the pulmonary arteries. No evidence of pulmonary embolism, with sensitivity limited to the segmental level and proximal. No  suspicious lymph nodes. Negative aorta. LUNG WINDOWS: Diffuse bronchial wall thickening with scattered subsegmental atelectasis. Small pleural effusions without pulmonary edema. UPPER ABDOMEN: No acute findings. OSSEOUS: No acute fracture. No suspicious lytic or blastic lesions. Thoracic dextroscoliosis. Review of the MIP images confirms the above findings. IMPRESSION: 1.  CTA of the pulmonary arteries is limited by motion. No evidence of pulmonary embolism to the segmental level. 2. Bronchitis with subsegmental atelectasis. 3. Moderate pericardial effusion, increased from abdominal CT 10/28/2014, and trace pleural effusions. Electronically Signed   By: Monte Fantasia M.D.   On: 12/15/2014 11:20     CBC  Recent Labs Lab 12/31/14 1746  WBC 7.6  HGB 9.4*  HCT 30.4*  PLT 443*  MCV 68.7*  MCH 21.3*  MCHC 31.1*  RDW 16.7*    Chemistries   Recent Labs Lab 12/31/14 1746 01/01/15 0557 01/02/15 0151  NA 134* 134* 133*  K 4.2 4.2 3.8  CL 101 99* 100*  CO2 30 32 30  GLUCOSE 235* 254* 109*  BUN 11 14 22*  CREATININE 0.84 0.88 1.14*  CALCIUM 8.4* 8.3* 8.1*  AST 16  --   --   ALT 17  --   --   ALKPHOS 88  --   --   BILITOT 0.2*  --   --    ------------------------------------------------------------------------------------------------------------------ estimated creatinine clearance is 94.4 mL/min (by C-G formula based on Cr of 1.14). ------------------------------------------------------------------------------------------------------------------ No results for input(s): HGBA1C in the last 72 hours. ------------------------------------------------------------------------------------------------------------------ No results for input(s): CHOL, HDL, LDLCALC, TRIG, CHOLHDL, LDLDIRECT in the last 72 hours. ------------------------------------------------------------------------------------------------------------------ No results for input(s): TSH, T4TOTAL, T3FREE, THYROIDAB in the last  72 hours.  Invalid input(s): FREET3 ------------------------------------------------------------------------------------------------------------------ No results for input(s): VITAMINB12, FOLATE, FERRITIN, TIBC, IRON, RETICCTPCT in the last 72 hours.  Coagulation profile No results for input(s): INR, PROTIME in the last 168 hours.  No results for input(s): DDIMER in the last 72 hours.  Cardiac Enzymes  Recent Labs Lab 12/31/14 2306 01/01/15 0557 01/01/15 1158  TROPONINI 0.04* <0.03 <0.03   ------------------------------------------------------------------------------------------------------------------ Invalid input(s): POCBNP    Assessment & Plan   44 year old African American female history of diastolic and his heart failure as well as type 2 diabetes insulin frank shortness breath  1. Acute on chronic diastolic congestive heart failure:  [name] Lasix is changed to orally cardiology wants to monitor renal function to decide on the final discharge home dose  2. Type 2 diabetes insulin requiring: Continue basal insulin at insulin sliding scale blood glucose stable 3. Essential hypertension: Continue with Norvasc ,beta-blockade , patient has issues with him ACE inhibitor  she is on losartan tolerating well] 4. Venous thromboembolism prophylactic: Heparin subcutaneousHCC)      Code Status Orders        Start     Ordered   12/31/14 2026  Full code   Continuous     12/31/14 2025           Consults  cardiology DVT Prophylaxis  heparin  Lab Results  Component Value Date   PLT 443* 12/31/2014     Time Spent in minutes  35 minutes  Dustin Flock M.D on 01/02/2015 at 2:28 PM  Between 7am to 6pm - Pager - [phone]  After 6pm go to [email or web address] - password [email or web address] [facility]   Office  [phone]

## 2015-01-02 NOTE — Progress Notes (Signed)
Patient has rested quietly tonight. No complaints of pain, one PRN breathing treatment given with relief post-treatment, and no signs of discomfort or distress noted. Nursing staff will continue to monitor. Earleen Reaper, RN

## 2015-01-03 DIAGNOSIS — E66813 Obesity, class 3: Secondary | ICD-10-CM | POA: Insufficient documentation

## 2015-01-03 DIAGNOSIS — R0602 Shortness of breath: Secondary | ICD-10-CM

## 2015-01-03 DIAGNOSIS — I10 Essential (primary) hypertension: Secondary | ICD-10-CM | POA: Insufficient documentation

## 2015-01-03 DIAGNOSIS — I5033 Acute on chronic diastolic (congestive) heart failure: Secondary | ICD-10-CM | POA: Diagnosis not present

## 2015-01-03 LAB — BASIC METABOLIC PANEL
ANION GAP: 5 (ref 5–15)
BUN: 24 mg/dL — ABNORMAL HIGH (ref 6–20)
CALCIUM: 8 mg/dL — AB (ref 8.9–10.3)
CO2: 32 mmol/L (ref 22–32)
CREATININE: 1.03 mg/dL — AB (ref 0.44–1.00)
Chloride: 100 mmol/L — ABNORMAL LOW (ref 101–111)
GFR calc Af Amer: >60 mL/min (ref >60)
GFR calc non Af Amer: >60 mL/min (ref >60)
Glucose, Bld: 62 mg/dL — ABNORMAL LOW (ref 65–99)
Potassium: 3.9 mmol/L (ref 3.5–5.1)
Sodium: 137 mmol/L (ref 135–145)

## 2015-01-03 LAB — GLUCOSE, CAPILLARY
GLUCOSE-CAPILLARY: 87 mg/dL (ref 65–99)
Glucose-Capillary: 135 mg/dL — ABNORMAL HIGH (ref 65–99)

## 2015-01-03 LAB — CBC WITH DIFFERENTIAL/PLATELET
BASOS ABS: 0.5 10*3/uL — AB (ref 0–0.1)
BASOS PCT: 7 %
EOS ABS: 0.3 10*3/uL (ref 0–0.7)
Eosinophils Relative: 4 %
HEMATOCRIT: 30.2 % — AB (ref 35.0–47.0)
HEMOGLOBIN: 9.1 g/dL — AB (ref 12.0–16.0)
Lymphocytes Relative: 15 %
Lymphs Abs: 1.1 10*3/uL (ref 1.0–3.6)
MCH: 21.2 pg — ABNORMAL LOW (ref 26.0–34.0)
MCHC: 30.3 g/dL — AB (ref 32.0–36.0)
MCV: 70 fL — ABNORMAL LOW (ref 80.0–100.0)
MONOS PCT: 11 %
Monocytes Absolute: 0.7 10*3/uL (ref 0.2–0.9)
NEUTROS ABS: 4.4 10*3/uL (ref 1.4–6.5)
NEUTROS PCT: 63 %
Platelets: 397 10*3/uL (ref 150–440)
RBC: 4.31 MIL/uL (ref 3.80–5.20)
RDW: 16.8 % — ABNORMAL HIGH (ref 11.5–14.5)
WBC: 6.8 10*3/uL (ref 3.6–11.0)

## 2015-01-03 LAB — LIPID PANEL
Cholesterol: 232 mg/dL — ABNORMAL HIGH (ref 0–200)
HDL: 41 mg/dL (ref >40)
LDL CALC: 169 mg/dL — AB (ref 0–99)
Total CHOL/HDL Ratio: 5.7 RATIO
Triglycerides: 112 mg/dL (ref <150)
VLDL: 22 mg/dL (ref 0–40)

## 2015-01-03 MED ORDER — BUDESONIDE 0.25 MG/2ML IN SUSP: 0.2500 mg | Freq: Two times a day (BID) | RESPIRATORY_TRACT | Status: DC

## 2015-01-03 MED ORDER — IPRATROPIUM-ALBUTEROL 0.5-2.5 (3) MG/3ML IN SOLN: 3.0000 mL | Freq: Four times a day (QID) | RESPIRATORY_TRACT | Status: DC | PRN

## 2015-01-03 MED ORDER — LOSARTAN POTASSIUM 50 MG PO TABS: 50.0000 mg | ORAL_TABLET | Freq: Every day | ORAL | Status: DC

## 2015-01-03 MED ORDER — FUROSEMIDE 40 MG PO TABS
40.0000 mg | ORAL_TABLET | Freq: Two times a day (BID) | ORAL | Status: DC
Start: 2015-01-03 — End: 2015-03-13

## 2015-01-03 NOTE — Progress Notes (Signed)
Patient: [name] / Admit Date: 12/31/2014 / Date of Encounter: 01/03/2015, 9:43 AM   Subjective: Feeling better today. Ambulated in her room on 11/7 with some SOB. SCr improved with only getting PO Lasix 40 mg x 1 on 11/7. Chronic anemia is stable. She receives iron infusions from the cancer center, though has missed some lately. Drinks large amounts of water.   Review of Systems: Review of Systems  Constitutional: Positive for weight loss and malaise/fatigue. Negative for fever, chills and diaphoresis.  HENT: Negative for congestion.   Eyes: Negative for discharge and redness.  Respiratory: Positive for cough and shortness of breath. Negative for hemoptysis, sputum production and wheezing.   Cardiovascular: Positive for leg swelling. Negative for chest pain, palpitations, orthopnea, claudication and PND.  Gastrointestinal: Negative for nausea, vomiting and abdominal pain.  Musculoskeletal: Negative for falls.  Skin: Negative for rash.  Neurological: Positive for weakness. Negative for sensory change, speech change, focal weakness and loss of consciousness.  Endo/Heme/Allergies: Does not bruise/bleed easily.  Psychiatric/Behavioral: The patient is not nervous/anxious.     Objective: Telemetry: NSR, 80's Physical Exam: Blood pressure 142/73, pulse 84, temperature 98.4 F (36.9 C), temperature source Oral, resp. rate 18, height 5\' 4"  (1.626 m), weight 337 lb 9.6 oz (153.134 kg), last menstrual period 12/15/2014, SpO2 93 %. Body mass index is 57.92 kg/(m^2). General: Well developed, well nourished, in no acute distress. Head: Normocephalic, atraumatic, sclera non-icteric, no xanthomas, nares are without discharge. Neck: Negative for carotid bruits. JVP not elevated. Lungs: Clear bilaterally to auscultation without wheezes, rales, or rhonchi. Breathing is unlabored. Heart: RRR S1 S2 without murmurs, rubs, or gallops.  Abdomen: Obese, soft, non-tender, non-distended with  normoactive bowel sounds. No rebound/guarding. Extremities: No clubbing or cyanosis. Trace pre-tibial edema. Distal pedal pulses are 2+ and equal bilaterally. Neuro: Alert and oriented X 3. Moves all extremities spontaneously. Psych:  Responds to questions appropriately with a normal affect.   Intake/Output Summary (Last 24 hours) at 01/03/15 0943 Last data filed at 01/03/15 0550  Gross per 24 hour  Intake    480 ml  Output   3000 ml  Net  -2520 ml    Inpatient Medications:  . amLODipine  10 mg Oral Daily  . aspirin EC  81 mg Oral Daily  . budesonide  0.25 mg Nebulization BID  . enoxaparin (LOVENOX) injection  40 mg Subcutaneous Q12H  . furosemide  40 mg Oral BID  . hydrALAZINE  100 mg Oral 3 times per day  . insulin aspart  0-5 Units Subcutaneous QHS  . insulin aspart  0-9 Units Subcutaneous TID WC  . insulin aspart protamine- aspart  55 Units Subcutaneous BID WC  . losartan  50 mg Oral Daily  . metoprolol  75 mg Oral BID  . potassium chloride  10 mEq Oral BID  . sodium chloride  3 mL Intravenous Q12H   Infusions:    Labs:  Recent Labs  01/02/15 0151 01/03/15 0354  NA 133* 137  K 3.8 3.9  CL 100* 100*  CO2 30 32  GLUCOSE 109* 62*  BUN 22* 24*  CREATININE 1.14* 1.03*  CALCIUM 8.1* 8.0*    Recent Labs  12/31/14 1746  AST 16  ALT 17  ALKPHOS 88  BILITOT 0.2*  PROT 6.2*  ALBUMIN 2.7*    Recent Labs  12/31/14 1746 01/03/15 0354  WBC 7.6 6.8  NEUTROABS  --  4.4  HGB 9.4* 9.1*  HCT 30.4* 30.2*  MCV 68.7*  70.0*  PLT 443* 397    Recent Labs  12/31/14 1746 12/31/14 2306 01/01/15 0557 01/01/15 1158  TROPONINI 0.03 0.04* <0.03 <0.03   Invalid input(s): POCBNP No results for input(s): HGBA1C in the last 72 hours.   Weights: Filed Weights   01/02/15 0500 01/02/15 0626 01/03/15 0626  Weight: 338 lb 6.5 oz (153.5 kg) 337 lb 3.2 oz (152.953 kg) 337 lb 9.6 oz (153.134 kg)     Radiology/Studies:  Dg Chest 2 View  12/31/2014  CLINICAL DATA:   Shortness of breath EXAM: CHEST  2 VIEW COMPARISON:  12/15/2014 chest radiograph FINDINGS: Stable cardiomediastinal silhouette with mild cardiomegaly. No pneumothorax. No pleural effusion. Mild pulmonary edema. There is focal curvilinear opacity in the right middle lobe. IMPRESSION: 1. Mild cardiomegaly with mild pulmonary edema, most in keeping with mild congestive heart failure. 2. Focal curvilinear opacity in the right middle lobe, favor atelectasis. Electronically Signed   By: Ilona Sorrel M.D.   On: 12/31/2014 18:36   Dg Chest 2 View  12/15/2014  CLINICAL DATA:  Shortness of Breath EXAM: CHEST  2 VIEW COMPARISON:  11/20/2014 FINDINGS: Cardiomediastinal silhouette is stable. There is streaky right base medially atelectasis or early infiltrate. No pulmonary edema. IMPRESSION: Streaky right base medially atelectasis or early infiltrate. No pulmonary edema. Electronically Signed   By: Lahoma Crocker M.D.   On: 12/15/2014 09:46   Ct Angio Chest Pe W/cm &/or Wo Cm  12/15/2014  CLINICAL DATA:  Severe dyspnea EXAM: CT ANGIOGRAPHY CHEST WITH CONTRAST TECHNIQUE: Multidetector CT imaging of the chest was performed using the standard protocol during bolus administration of intravenous contrast. Multiplanar CT image reconstructions and MIPs were obtained to evaluate the vascular anatomy. CONTRAST:  165mL OMNIPAQUE IOHEXOL 350 MG/ML SOLN COMPARISON:  None. FINDINGS: THORACIC INLET/BODY WALL: No acute abnormality. MEDIASTINUM: Mild cardiomegaly with a moderate low-density pericardial effusion without serosal enhancement or nodularity. The fluid has increased from abdominal CT 10/28/2014. No straightening of the interventricular septum or free wall collapse. Scan is essentially free breathing, limiting evaluation of the pulmonary arteries. No evidence of pulmonary embolism, with sensitivity limited to the segmental level and proximal. No suspicious lymph nodes. Negative aorta. LUNG WINDOWS: Diffuse bronchial wall  thickening with scattered subsegmental atelectasis. Small pleural effusions without pulmonary edema. UPPER ABDOMEN: No acute findings. OSSEOUS: No acute fracture. No suspicious lytic or blastic lesions. Thoracic dextroscoliosis. Review of the MIP images confirms the above findings. IMPRESSION: 1. CTA of the pulmonary arteries is limited by motion. No evidence of pulmonary embolism to the segmental level. 2. Bronchitis with subsegmental atelectasis. 3. Moderate pericardial effusion, increased from abdominal CT 10/28/2014, and trace pleural effusions. Electronically Signed   By: Monte Fantasia M.D.   On: 12/15/2014 11:20     Assessment and Plan   1. Acute on chronic diastolic CHF, NYHA Class III: -Improving -Lasix 40 mg bid with KCl repletion  -Can increase Lasix if needed prn SOB, weight gain, lower extremity swelling -Continue metoprolol 75 mg bid and losartan 50 mg daily -SCr improving   2. Morbid obesity: -Likely contributing to her symptoms  -PT  3. Hyponatremia: -Stable  4. DM2: -Uncontrolled -Per IM -Likely contributing to her symptoms  5. HTN with CHF: -Metoprolol 75 mg bid, amlodipine 10 mg daily, losartan 50 mg daily, and hydralazine 100 mg tid -Stable  6. Iron deficiency anemia: -Stable -Gets iron fusions from cancer center -Try to limit fluid intake -Can chew gum/chew ice rather than always drink large amounts of water  7.  Acute renal insufficiency: -Improved  8. Cough: -Question bronchitis vs allergies   Signed, Christell Faith, PA-C Pager: [phone] 01/03/2015, 9:43 AM

## 2015-01-03 NOTE — Progress Notes (Signed)
Inpatient Diabetes Program Recommendations  AACE/ADA: New Consensus Statement on Inpatient Glycemic Control (2015)  Target Ranges:  Prepandial:   less than 140 mg/dL      Peak postprandial:   less than 180 mg/dL (1-2 hours)      Critically ill patients:  140 - 180 mg/dL   Review of Glycemic Control  Results for [name], [name] (MRN [id number]) as of 01/03/2015 12:42  Ref. Range 01/02/2015 11:41 01/02/2015 16:54 01/02/2015 20:59 01/03/2015 08:10 01/03/2015 11:41  Glucose-Capillary Latest Ref Range: 65-99 mg/dL 96 192 (H) 84 87 135 (H)    Inpatient Diabetes Program Recommendations:  Spoke with patient about insulin at home- She admits to taking Novolog 70/30  50 units bid at home as opposed to 55 units that was written in the orders.  I have asked her (as documented in discharge orders) to take 55 units as ordered.   Gentry Fitz, RN, BA, MHA, CDE Diabetes Coordinator Inpatient Diabetes Program  [phone] (Team Pager) [phone] ([place]) 01/03/2015 12:46 PM

## 2015-01-03 NOTE — Progress Notes (Signed)
Patient has rested quietly tonight. Minimal complaints of pain acknowledged and treated; no signs of discomfort or distress noted. Discharge possible for today depending on MD's recommendations. Nursing staff will continue to monitor. Earleen Reaper, RN

## 2015-01-03 NOTE — Discharge Summary (Signed)
[name], 44 y.o., DOB 01-16-1971, MRN [id number]. Admission date: 12/31/2014 Discharge Date 01/03/2015 Primary MD Kasandra Knudsen, NP Admitting Physician Lytle Butte, MD  Admission Diagnosis  Acute on chronic congestive heart failure, unspecified congestive heart failure type [facility]) [I50.9]  Discharge Diagnosis   Principal Problem:   Acute on chronic diastolic heart failure (HCC) Active Problems:   CHF (congestive heart failure) ([place])   Morbid obesity due to excess calories (HCC)   Shortness of breath   Essential hypertension    Hyperlipidemia Diabetes type [name] is a 44 y.o. female with a known history of diastolic congestive heart failure) with shortness of breath. Patient was recently hospitalized with diastolic CHF was and was in the hospital. She reports that she has been taking her medications like she supposed to. She was admitted for acute diastolic CHF exasperation treated with Lasix with improvement in her symptoms. Her blood pressure was elevated but we adjusted her medications a blood pressure doing much better. I strongly recommended she adhere to her diet and her medications. Her Lasix dose has been increased compared to her previous admission. She is also referred to the CHF clinic for close monitoring and adjustment of her regimen.           Consults  cardiology  Significant Tests:  See full reports for all details    Dg Chest 2 View  12/31/2014  CLINICAL DATA:  Shortness of breath EXAM: CHEST  2 VIEW COMPARISON:  12/15/2014 chest radiograph FINDINGS: Stable cardiomediastinal silhouette with mild cardiomegaly. No pneumothorax. No pleural effusion. Mild pulmonary edema. There is focal curvilinear opacity in the right middle lobe. IMPRESSION: 1. Mild cardiomegaly with mild pulmonary edema, most in keeping with mild congestive heart failure. 2. Focal curvilinear opacity in the right middle lobe, favor atelectasis.  Electronically Signed   By: Ilona Sorrel M.D.   On: 12/31/2014 18:36   Dg Chest 2 View  12/15/2014  CLINICAL DATA:  Shortness of Breath EXAM: CHEST  2 VIEW COMPARISON:  11/20/2014 FINDINGS: Cardiomediastinal silhouette is stable. There is streaky right base medially atelectasis or early infiltrate. No pulmonary edema. IMPRESSION: Streaky right base medially atelectasis or early infiltrate. No pulmonary edema. Electronically Signed   By: Lahoma Crocker M.D.   On: 12/15/2014 09:46   Ct Angio Chest Pe W/cm &/or Wo Cm  12/15/2014  CLINICAL DATA:  Severe dyspnea EXAM: CT ANGIOGRAPHY CHEST WITH CONTRAST TECHNIQUE: Multidetector CT imaging of the chest was performed using the standard protocol during bolus administration of intravenous contrast. Multiplanar CT image reconstructions and MIPs were obtained to evaluate the vascular anatomy. CONTRAST:  134mL OMNIPAQUE IOHEXOL 350 MG/ML SOLN COMPARISON:  None. FINDINGS: THORACIC INLET/BODY WALL: No acute abnormality. MEDIASTINUM: Mild cardiomegaly with a moderate low-density pericardial effusion without serosal enhancement or nodularity. The fluid has increased from abdominal CT 10/28/2014. No straightening of the interventricular septum or free wall collapse. Scan is essentially free breathing, limiting evaluation of the pulmonary arteries. No evidence of pulmonary embolism, with sensitivity limited to the segmental level and proximal. No suspicious lymph nodes. Negative aorta. LUNG WINDOWS: Diffuse bronchial wall thickening with scattered subsegmental atelectasis. Small pleural effusions without pulmonary edema. UPPER ABDOMEN: No acute findings. OSSEOUS: No acute fracture. No suspicious lytic or blastic lesions. Thoracic dextroscoliosis. Review of the MIP images confirms the above findings. IMPRESSION: 1. CTA of the pulmonary arteries is limited by motion. No evidence of pulmonary embolism to the segmental level.  2. Bronchitis with subsegmental atelectasis. 3. Moderate  pericardial effusion, increased from abdominal CT 10/28/2014, and trace pleural effusions. Electronically Signed   By: Monte Fantasia M.D.   On: 12/15/2014 11:20       Today   Subjective:   [name]  feels better shortness of breath improved no chest pain  Objective:   Blood pressure 132/75, pulse 85, temperature 98.2 F (36.8 C), temperature source Oral, resp. rate 18, height 5\' 4"  (1.626 m), weight 153.134 kg (337 lb 9.6 oz), last menstrual period 12/15/2014, SpO2 93 %.  .  Intake/Output Summary (Last 24 hours) at 01/03/15 1449 Last data filed at 01/03/15 1300  Gross per 24 hour  Intake    720 ml  Output   1800 ml  Net  -1080 ml    Exam VITAL SIGNS: Blood pressure 132/75, pulse 85, temperature 98.2 F (36.8 C), temperature source Oral, resp. rate 18, height 5\' 4"  (1.626 m), weight 153.134 kg (337 lb 9.6 oz), last menstrual period 12/15/2014, SpO2 93 %.  GENERAL:  44 y.o.-year-old patient lying in the bed with no acute distress.  EYES: Pupils equal, round, reactive to light and accommodation. No scleral icterus. Extraocular muscles intact.  HEENT: Head atraumatic, normocephalic. Oropharynx and nasopharynx clear.  NECK:  Supple, no jugular venous distention. No thyroid enlargement, no tenderness.  LUNGS: Normal breath sounds bilaterally, no wheezing, rales,rhonchi or crepitation. No use of accessory muscles of respiration.  CARDIOVASCULAR: S1, S2 normal. No murmurs, rubs, or gallops.  ABDOMEN: Soft, nontender, nondistended. Bowel sounds present. No organomegaly or mass.  EXTREMITIES: No pedal edema, cyanosis, or clubbing.  NEUROLOGIC: Cranial nerves II through XII are intact. Muscle strength 5/5 in all extremities. Sensation intact. Gait not checked.  PSYCHIATRIC: The patient is alert and oriented x 3.  SKIN: No obvious rash, lesion, or ulcer.   Data Review     CBC w Diff: Lab Results  Component Value Date   WBC 6.8 01/03/2015   WBC 7.1 01/11/2014   HGB 9.1*  01/03/2015   HGB 12.4 01/11/2014   HCT 30.2* 01/03/2015   HCT 39.2 01/11/2014   PLT 397 01/03/2015   PLT 370 01/11/2014   LYMPHOPCT 15 01/03/2015   LYMPHOPCT 25.9 01/11/2014   MONOPCT 11 01/03/2015   MONOPCT 6.6 01/11/2014   EOSPCT 4 01/03/2015   EOSPCT 2.4 01/11/2014   BASOPCT 7 01/03/2015   BASOPCT 1.3 01/11/2014   CMP: Lab Results  Component Value Date   NA 137 01/03/2015   NA 135* 11/21/2013   K 3.9 01/03/2015   K 4.1 11/21/2013   CL 100* 01/03/2015   CL 98 11/21/2013   CO2 32 01/03/2015   CO2 30 11/21/2013   BUN 24* 01/03/2015   BUN 16 11/21/2013   CREATININE 1.03* 01/03/2015   CREATININE 0.85 11/21/2013   PROT 6.2* 12/31/2014   ALBUMIN 2.7* 12/31/2014   BILITOT 0.2* 12/31/2014   ALKPHOS 88 12/31/2014   AST 16 12/31/2014   ALT 17 12/31/2014  .  Micro Results No results found for this or any previous visit (from the past 240 hour(s)).      Code Status Orders        Start     Ordered   12/31/14 2026  Full code   Continuous     12/31/14 2025          Follow-up Information    Follow up with HOLLAND, CHELSA, NP In 7 days.   Specialty:  Nurse Practitioner   Why:  Tuesday, November 15th at [place], [facility] information:   [place] [id number] [phone]       Follow up with chf clinic In 5 days.      Follow up with Murray Hodgkins, NP.   Specialties:  Nurse Practitioner, Cardiology, Radiology   Why:  Tuesday, November 22nd at 11am, [facility] information:   [place] [address] [place] [address] [phone]       Discharge Medications     Medication List    TAKE these medications        acetaminophen 325 MG tablet  Commonly known as:  TYLENOL  Take 2 tablets (650 mg total) by mouth every 6 (six) hours as needed for mild pain (or Fever >/= 101).     albuterol 108 (90 BASE) MCG/ACT inhaler  Commonly known as:  PROVENTIL HFA;VENTOLIN HFA  Inhale 2 puffs into the lungs every 6 (six) hours as  needed for wheezing or shortness of breath.     albuterol (2.5 MG/3ML) 0.083% nebulizer solution  Commonly known as:  PROVENTIL  Take 3 mLs (2.5 mg total) by nebulization every 6 (six) hours as needed for wheezing.     amLODipine 10 MG tablet  Commonly known as:  NORVASC  Take 1 tablet (10 mg total) by mouth daily.     aspirin 81 MG EC tablet  Take 1 tablet (81 mg total) by mouth daily.     budesonide 0.25 MG/2ML nebulizer solution  Commonly known as:  PULMICORT  Take 2 mLs (0.25 mg total) by nebulization 2 (two) times daily.     clonazePAM 2 MG tablet  Commonly known as:  KLONOPIN  Take 2 mg by mouth at bedtime.     diazepam 5 MG tablet  Commonly known as:  VALIUM  Take 1 tablet (5 mg total) by mouth every 8 (eight) hours as needed for muscle spasms.     furosemide 40 MG tablet  Commonly known as:  LASIX  Take 1 tablet (40 mg total) by mouth 2 (two) times daily.     hydrALAZINE 100 MG tablet  Commonly known as:  APRESOLINE  Take 1 tablet (100 mg total) by mouth every 8 (eight) hours.     insulin aspart protamine- aspart (70-30) 100 UNIT/ML injection  Commonly known as:  NOVOLOG MIX 70/30  Inject 0.55 mLs (55 Units total) into the skin 2 (two) times daily with a meal.     losartan 50 MG tablet  Commonly known as:  COZAAR  Take 1 tablet (50 mg total) by mouth daily.     Metoprolol Tartrate 75 MG Tabs  Take 75 mg by mouth 2 (two) times daily.     potassium chloride 10 MEQ tablet  Commonly known as:  K-DUR,KLOR-CON  Take 1 tablet (10 mEq total) by mouth 2 (two) times daily.           Total Time in preparing paper work, data evaluation and todays exam - 35 minutes  Dustin Flock M.D on 01/03/2015 at 2:49 PM  [facility]   Office  [phone]

## 2015-01-03 NOTE — Discharge Instructions (Signed)
°  DIET:  Cardiac diet, carohydrate controlled diet  DISCHARGE CONDITION:  Stable  ACTIVITY:  Activity as tolerated  OXYGEN:  Home Oxygen: No.   Oxygen Delivery: room air  DISCHARGE LOCATION:  home    ADDITIONAL DISCHARGE INSTRUCTION:   If you experience worsening of your admission symptoms, develop shortness of breath, life threatening emergency, suicidal or homicidal thoughts you must seek medical attention immediately by calling 911 or calling your MD immediately  if symptoms less severe.  You Must read complete instructions/literature along with all the possible adverse reactions/side effects for all the Medicines you take and that have been prescribed to you. Take any new Medicines after you have completely understood and accpet all the possible adverse reactions/side effects.   Please note  You were cared for by a hospitalist during your hospital stay. If you have any questions about your discharge medications or the care you received while you were in the hospital after you are discharged, you can call the unit and asked to speak with the hospitalist on call if the hospitalist that took care of you is not available. Once you are discharged, your primary care physician will handle any further medical issues. Please note that NO REFILLS for any discharge medications will be authorized once you are discharged, as it is imperative that you return to your primary care physician (or establish a relationship with a primary care physician if you do not have one) for your aftercare needs so that they can reassess your need for medications and monitor your lab values.

## 2015-01-03 NOTE — Care Management (Signed)
Patient admitted with CHF.   Patient lives at home with her husband and 2 children.  Patient uses [facility] on [address] to obtain her medications.  Patient states that she has recently been approved for Medicaid which covers her medications.  Patient states that she had an appointment at the Heart Failure clinic, but was unable to go because her Medicaid was listed as family planning only.  Patient to call to clarify Medicaid coverage.   Patient is currently unemployed and receives $167 a week.  Husband received $635 monthly in SSI.  Patient states they also receive food stamps.  Patient states that she has transportation, and once she gets her Medicaid corrected she plans to continue her outpatient followup

## 2015-01-03 NOTE — Progress Notes (Signed)
Pt is a&o, VSS, NSR on tele with a complaint of pain in which PRN med was given. Order to d/c pt to home. Discharge instructions and prescriptions given to pt with verbal acknowledgment of understanding. IV and tele removed and pt currently awaiting volunteer services for d/c.

## 2015-01-05 ENCOUNTER — Ambulatory Visit: Payer: Medicaid Other

## 2015-01-17 ENCOUNTER — Telehealth: Payer: Self-pay

## 2015-01-17 ENCOUNTER — Encounter: Payer: Medicaid Other | Admitting: Nurse Practitioner

## 2015-01-17 NOTE — Telephone Encounter (Signed)
Pt called to confirm appt today, states she thinks her Medicaid is still under family planning. I informed her she will need to sign a waiver stating she will be responsible if she does not get it changed. Pt understands.

## 2015-02-16 ENCOUNTER — Inpatient Hospital Stay: Payer: Medicaid Other | Admitting: Oncology

## 2015-02-16 ENCOUNTER — Inpatient Hospital Stay: Payer: Medicaid Other | Attending: Oncology

## 2015-02-16 ENCOUNTER — Inpatient Hospital Stay (HOSPITAL_BASED_OUTPATIENT_CLINIC_OR_DEPARTMENT_OTHER): Payer: Medicaid Other | Admitting: Oncology

## 2015-02-16 ENCOUNTER — Inpatient Hospital Stay: Payer: Medicaid Other

## 2015-02-16 VITALS — BP 149/81 | HR 80 | Temp 99.1°F | Resp 18 | Wt 336.2 lb

## 2015-02-16 DIAGNOSIS — D473 Essential (hemorrhagic) thrombocythemia: Secondary | ICD-10-CM | POA: Insufficient documentation

## 2015-02-16 DIAGNOSIS — D72829 Elevated white blood cell count, unspecified: Secondary | ICD-10-CM

## 2015-02-16 DIAGNOSIS — Z7982 Long term (current) use of aspirin: Secondary | ICD-10-CM | POA: Diagnosis not present

## 2015-02-16 DIAGNOSIS — I313 Pericardial effusion (noninflammatory): Secondary | ICD-10-CM

## 2015-02-16 DIAGNOSIS — I10 Essential (primary) hypertension: Secondary | ICD-10-CM | POA: Diagnosis not present

## 2015-02-16 DIAGNOSIS — J45909 Unspecified asthma, uncomplicated: Secondary | ICD-10-CM | POA: Insufficient documentation

## 2015-02-16 DIAGNOSIS — R5383 Other fatigue: Secondary | ICD-10-CM | POA: Diagnosis not present

## 2015-02-16 DIAGNOSIS — Z79899 Other long term (current) drug therapy: Secondary | ICD-10-CM | POA: Insufficient documentation

## 2015-02-16 DIAGNOSIS — E1165 Type 2 diabetes mellitus with hyperglycemia: Secondary | ICD-10-CM | POA: Diagnosis not present

## 2015-02-16 DIAGNOSIS — D509 Iron deficiency anemia, unspecified: Secondary | ICD-10-CM | POA: Insufficient documentation

## 2015-02-16 DIAGNOSIS — I509 Heart failure, unspecified: Secondary | ICD-10-CM

## 2015-02-16 DIAGNOSIS — R531 Weakness: Secondary | ICD-10-CM | POA: Diagnosis not present

## 2015-02-16 LAB — CBC WITH DIFFERENTIAL/PLATELET
Basophils Absolute: 0.1 10*3/uL (ref 0–0.1)
EOS ABS: 0.1 10*3/uL (ref 0–0.7)
HEMATOCRIT: 33 % — AB (ref 35.0–47.0)
HEMOGLOBIN: 9.5 g/dL — AB (ref 12.0–16.0)
Lymphocytes Relative: 12 %
Lymphs Abs: 1.6 10*3/uL (ref 1.0–3.6)
MCH: 18.8 pg — AB (ref 26.0–34.0)
MCHC: 28.8 g/dL — AB (ref 32.0–36.0)
MCV: 65.2 fL — AB (ref 80.0–100.0)
Monocytes Absolute: 0.8 10*3/uL (ref 0.2–0.9)
Monocytes Relative: 6 %
Neutro Abs: 10.4 10*3/uL — ABNORMAL HIGH (ref 1.4–6.5)
Neutrophils Relative %: 80 %
Platelets: 589 10*3/uL — ABNORMAL HIGH (ref 150–440)
RBC: 5.06 MIL/uL (ref 3.80–5.20)
RDW: 17.3 % — ABNORMAL HIGH (ref 11.5–14.5)
WBC: 13.1 10*3/uL — AB (ref 3.6–11.0)

## 2015-02-16 LAB — IRON AND TIBC
Iron: 16 ug/dL — ABNORMAL LOW (ref 28–170)
SATURATION RATIOS: 4 % — AB (ref 10.4–31.8)
TIBC: 371 ug/dL (ref 250–450)
UIBC: 355 ug/dL

## 2015-02-16 LAB — FERRITIN: Ferritin: 5 ng/mL — ABNORMAL LOW (ref 11–307)

## 2015-02-16 NOTE — Progress Notes (Signed)
Patient was last seen in 2015 at this office for iron infusions.  She had her last labs drawn 1-2 weeks ago and her PCP advised her to come back for iron infusions.

## 2015-02-17 ENCOUNTER — Inpatient Hospital Stay: Payer: Medicaid Other

## 2015-02-17 DIAGNOSIS — D509 Iron deficiency anemia, unspecified: Secondary | ICD-10-CM | POA: Insufficient documentation

## 2015-02-21 ENCOUNTER — Inpatient Hospital Stay: Payer: Medicaid Other

## 2015-02-21 ENCOUNTER — Telehealth: Payer: Self-pay | Admitting: Nurse Practitioner

## 2015-02-21 VITALS — BP 148/87 | HR 79 | Temp 98.0°F | Resp 20

## 2015-02-21 DIAGNOSIS — D509 Iron deficiency anemia, unspecified: Secondary | ICD-10-CM

## 2015-02-21 MED ORDER — RANITIDINE HCL 1000 MG/40ML IJ SOLN: 50.0000 mg | Freq: Once | INTRAMUSCULAR | Status: DC

## 2015-02-21 MED ORDER — ACETAMINOPHEN 325 MG PO TABS
650.0000 mg | ORAL_TABLET | Freq: Four times a day (QID) | ORAL | Status: DC | PRN
  Administered 2015-02-21: 650 mg via ORAL
  Filled 2015-02-21: qty 2

## 2015-02-21 MED ORDER — DIPHENHYDRAMINE HCL 50 MG/ML IJ SOLN
25.0000 mg | Freq: Once | INTRAMUSCULAR | Status: AC
  Administered 2015-02-21: 25 mg via INTRAVENOUS
  Filled 2015-02-21: qty 1

## 2015-02-21 MED ORDER — HEPARIN SOD (PORK) LOCK FLUSH 100 UNIT/ML IV SOLN: 500.0000 [IU] | Freq: Once | INTRAVENOUS | Status: DC | PRN

## 2015-02-21 MED ORDER — SODIUM CHLORIDE 0.9 % IJ SOLN
3.0000 mL | Freq: Once | INTRAMUSCULAR | Status: DC | PRN
  Filled 2015-02-21: qty 10

## 2015-02-21 MED ORDER — SODIUM CHLORIDE 0.9 % IJ SOLN
10.0000 mL | INTRAMUSCULAR | Status: DC | PRN
  Filled 2015-02-21: qty 10

## 2015-02-21 MED ORDER — SODIUM CHLORIDE 0.9 % IV SOLN
Freq: Once | INTRAVENOUS | Status: AC
  Administered 2015-02-21: 15:00:00 via INTRAVENOUS
  Filled 2015-02-21: qty 1000

## 2015-02-21 MED ORDER — SODIUM CHLORIDE 0.9 % IV SOLN
510.0000 mg | Freq: Once | INTRAVENOUS | Status: AC
  Administered 2015-02-21: 510 mg via INTRAVENOUS
  Filled 2015-02-21: qty 17

## 2015-02-21 MED ORDER — ALTEPLASE 2 MG IJ SOLR: 2.0000 mg | Freq: Once | INTRAMUSCULAR | Status: DC | PRN

## 2015-02-21 MED ORDER — HEPARIN SOD (PORK) LOCK FLUSH 100 UNIT/ML IV SOLN: 250.0000 [IU] | Freq: Once | INTRAVENOUS | Status: DC | PRN

## 2015-02-21 MED ORDER — DEXAMETHASONE SODIUM PHOSPHATE 100 MG/10ML IJ SOLN
20.0000 mg | Freq: Once | INTRAMUSCULAR | Status: AC
  Administered 2015-02-21: 20 mg via INTRAVENOUS
  Filled 2015-02-21: qty 2

## 2015-02-21 MED ORDER — FAMOTIDINE IN NACL 20-0.9 MG/50ML-% IV SOLN
20.0000 mg | Freq: Two times a day (BID) | INTRAVENOUS | Status: DC
  Administered 2015-02-21: 20 mg via INTRAVENOUS
  Filled 2015-02-21: qty 50

## 2015-02-21 NOTE — Telephone Encounter (Signed)
Records rec from [place] for 03/03/15 appointment with L. Gerhardt placed in chart prep bin.

## 2015-02-23 ENCOUNTER — Ambulatory Visit: Payer: Medicaid Other

## 2015-02-26 NOTE — Progress Notes (Signed)
[facility]  [phone] [phone]  ID: [name] OB: 07-14-1970  MR#: [id number]  [id number]  Patient Care Team: Kasandra Knudsen, NP as PCP - General (Nurse Practitioner)  CHIEF COMPLAINT:  Chief Complaint  Patient presents with  . Anemia    INTERVAL HISTORY: Patient last evaluated in November 2015. She has been referred back by her primary care physician after finding a decreased hemoglobin on routine blood work. Patient also complains of significant weakness and fatigue over the past several months. Currently, patient is upset because she will be unable to receive an iron infusion today. She has no neurologic complaints. She has no chest pain or shortness of breath. She denies any nausea, vomiting, consultation, or diarrhea. She has no melena or hematochezia. Patient otherwise feels well and offers no further specific complaints.  REVIEW OF SYSTEMS:   Review of Systems  Constitutional: Positive for malaise/fatigue. Negative for fever.  Respiratory: Negative.  Negative for sputum production.   Cardiovascular: Negative.   Gastrointestinal: Negative.  Negative for blood in stool and melena.  Musculoskeletal: Negative.   Neurological: Positive for weakness.    As per HPI. Otherwise, a complete review of systems is negatve.  PAST MEDICAL HISTORY: Past Medical History  Diagnosis Date  . Poorly controlled diabetes mellitus ([place])     a. 11/2014 A1c 13.2.  . Iron deficiency anemia   . Asthma   . Hypertension   . Pericardial effusion     a. 11/2014 CT Chest: Mod effusion.  . Diabetes mellitus without complication ([place])   . CHF (congestive heart failure) ([place])     PAST SURGICAL HISTORY: Past Surgical History  Procedure Laterality Date  . Cesarean section      FAMILY HISTORY Family History  Problem Relation Age of Onset  . Hypertension    . Diabetes         ADVANCED DIRECTIVES:    HEALTH MAINTENANCE: Social History    Substance Use Topics  . Smoking status: Never Smoker   . Smokeless tobacco: Not on file  . Alcohol Use: Yes     Colonoscopy:  PAP:  Bone density:  Lipid panel:  Allergies  Allergen Reactions  . Feraheme [Ferumoxytol] Itching    Given pre meds before infusions  . Sulfa Antibiotics Itching    Current Outpatient Prescriptions  Medication Sig Dispense Refill  . acetaminophen (TYLENOL) 325 MG tablet Take 2 tablets (650 mg total) by mouth every 6 (six) hours as needed for mild pain (or Fever >/= 101). 1 tablet 0  . albuterol (PROVENTIL HFA;VENTOLIN HFA) 108 (90 BASE) MCG/ACT inhaler Inhale 2 puffs into the lungs every 6 (six) hours as needed for wheezing or shortness of breath. 1 Inhaler 0  . albuterol (PROVENTIL) (2.5 MG/3ML) 0.083% nebulizer solution Take 3 mLs (2.5 mg total) by nebulization every 6 (six) hours as needed for wheezing. 75 mL 12  . amLODipine (NORVASC) 10 MG tablet Take 1 tablet (10 mg total) by mouth daily. 30 tablet 0  . aspirin EC 81 MG EC tablet Take 1 tablet (81 mg total) by mouth daily. 30 tablet 0  . budesonide (PULMICORT) 0.25 MG/2ML nebulizer solution Take 2 mLs (0.25 mg total) by nebulization 2 (two) times daily. 60 mL 12  . citalopram (CELEXA) 40 MG tablet Take 40 mg by mouth daily.    . clonazePAM (KLONOPIN) 2 MG tablet Take 2 mg by mouth at bedtime.    . furosemide (LASIX) 40 MG tablet Take 1 tablet (40  mg total) by mouth 2 (two) times daily. 60 tablet 0  . hydrALAZINE (APRESOLINE) 100 MG tablet Take 1 tablet (100 mg total) by mouth every 8 (eight) hours. 60 tablet 0  . insulin aspart protamine- aspart (NOVOLOG MIX 70/30) (70-30) 100 UNIT/ML injection Inject 0.55 mLs (55 Units total) into the skin 2 (two) times daily with a meal. (Patient taking differently: Inject 50 Units into the skin 2 (two) times daily with a meal. ) 10 mL 11  . ipratropium-albuterol (DUONEB) 0.5-2.5 (3) MG/3ML SOLN Take 3 mLs by nebulization every 6 (six) hours as needed. 360 mL 2  .  Metoprolol Tartrate 75 MG TABS Take 75 mg by mouth 2 (two) times daily. 60 tablet 0  . potassium chloride (K-DUR,KLOR-CON) 10 MEQ tablet Take 1 tablet (10 mEq total) by mouth 2 (two) times daily. 30 tablet 0  . simvastatin (ZOCOR) 40 MG tablet Take 40 mg by mouth daily.    . diazepam (VALIUM) 5 MG tablet Take 1 tablet (5 mg total) by mouth every 8 (eight) hours as needed for muscle spasms. (Patient not taking: Reported on 02/16/2015) 20 tablet 0  . losartan (COZAAR) 50 MG tablet Take 1 tablet (50 mg total) by mouth daily. (Patient not taking: Reported on 02/16/2015) 30 tablet 0   No current facility-administered medications for this visit.    OBJECTIVE: Filed Vitals:   02/16/15 1437  BP: 149/81  Pulse: 80  Temp: 99.1 F (37.3 C)  Resp: 18     Body mass index is 57.68 kg/(m^2).    ECOG FS:1 - Symptomatic but completely ambulatory  General: Well-developed, well-nourished, no acute distress. Eyes: Pink conjunctiva, anicteric sclera. HEENT: Normocephalic, moist mucous membranes, clear oropharnyx. Lungs: Clear to auscultation bilaterally. Heart: Regular rate and rhythm. No rubs, murmurs, or gallops. Abdomen: Soft, nontender, nondistended. No organomegaly noted, normoactive bowel sounds. Musculoskeletal: No edema, cyanosis, or clubbing. Neuro: Alert, answering all questions appropriately. Cranial nerves grossly intact. Skin: No rashes or petechiae noted. Psych: Normal affect. Lymphatics: No cervical, calvicular, axillary or inguinal LAD.   LAB RESULTS:  Lab Results  Component Value Date   NA 137 01/03/2015   K 3.9 01/03/2015   CL 100* 01/03/2015   CO2 32 01/03/2015   GLUCOSE 62* 01/03/2015   BUN 24* 01/03/2015   CREATININE 1.03* 01/03/2015   CALCIUM 8.0* 01/03/2015   PROT 6.2* 12/31/2014   ALBUMIN 2.7* 12/31/2014   AST 16 12/31/2014   ALT 17 12/31/2014   ALKPHOS 88 12/31/2014   BILITOT 0.2* 12/31/2014   GFRNONAA >60 01/03/2015   GFRAA >60 01/03/2015    Lab Results    Component Value Date   WBC 13.1* 02/16/2015   NEUTROABS 10.4* 02/16/2015   HGB 9.5* 02/16/2015   HCT 33.0* 02/16/2015   MCV 65.2* 02/16/2015   PLT 589* 02/16/2015     STUDIES: No results found.  ASSESSMENT: Iron deficiency anemia.  PLAN:    1. Iron deficiency anemia: Likely secondary to heavy menses. [name] hemoglobin and iron stores are decreased therefore she will benefit from 510 mg IV Feraheme. Previously, patient had a mild allergic reaction to Feraheme therefore in addition to her treatment she will receive 20 mg of dexamethasone, 25 mg Benadryl, 50 mg ranitidine, and 650 mg of Tylenol. Return to clinic tomorrow and then again in 1 week for her infusions. Patient will then return to clinic in 3 months for repeat laboratory work and further evaluation. 2. Thrombocytosis: Likely secondary to iron deficiency anemia. Monitor. 3. Leukocytosis: Possibly  reactive, monitor.  Patient expressed understanding and was in agreement with this plan. She also understands that She can call clinic at any time with any questions, concerns, or complaints.    Lloyd Huger, MD   02/26/2015 11:25 AM

## 2015-02-28 ENCOUNTER — Inpatient Hospital Stay: Payer: Medicaid Other | Attending: Oncology

## 2015-02-28 NOTE — Progress Notes (Signed)
Unable to get IV access. Pt stuck multiple times, all unsuccessful. Pt rescheduled for later in the week.

## 2015-03-03 ENCOUNTER — Inpatient Hospital Stay: Payer: Medicaid Other

## 2015-03-03 ENCOUNTER — Encounter: Payer: Medicaid Other | Admitting: Nurse Practitioner

## 2015-03-03 NOTE — Progress Notes (Signed)
Unable to initiate peripheral IV access today. Feraheme infusion not given. MD, Dr. Grayland Ormond, notified via telephone and aware. Per MD, Dr. Grayland Ormond, order: Hold Feraheme infusion. Patient discharged to home. Dr. Grayland Ormond will be in contact with patient next week to discuss further options.

## 2015-03-13 ENCOUNTER — Ambulatory Visit (INDEPENDENT_AMBULATORY_CARE_PROVIDER_SITE_OTHER): Payer: Medicaid Other | Admitting: Cardiovascular Disease

## 2015-03-13 ENCOUNTER — Encounter: Payer: Self-pay | Admitting: Cardiovascular Disease

## 2015-03-13 VITALS — BP 110/60 | HR 84 | Ht 64.5 in | Wt 352.2 lb

## 2015-03-13 DIAGNOSIS — I110 Hypertensive heart disease with heart failure: Secondary | ICD-10-CM

## 2015-03-13 DIAGNOSIS — R0602 Shortness of breath: Secondary | ICD-10-CM | POA: Diagnosis not present

## 2015-03-13 DIAGNOSIS — I10 Essential (primary) hypertension: Secondary | ICD-10-CM

## 2015-03-13 DIAGNOSIS — E1122 Type 2 diabetes mellitus with diabetic chronic kidney disease: Secondary | ICD-10-CM | POA: Insufficient documentation

## 2015-03-13 DIAGNOSIS — E1165 Type 2 diabetes mellitus with hyperglycemia: Secondary | ICD-10-CM | POA: Insufficient documentation

## 2015-03-13 DIAGNOSIS — N186 End stage renal disease: Secondary | ICD-10-CM | POA: Insufficient documentation

## 2015-03-13 DIAGNOSIS — I5033 Acute on chronic diastolic (congestive) heart failure: Secondary | ICD-10-CM

## 2015-03-13 DIAGNOSIS — D509 Iron deficiency anemia, unspecified: Secondary | ICD-10-CM

## 2015-03-13 DIAGNOSIS — E119 Type 2 diabetes mellitus without complications: Secondary | ICD-10-CM | POA: Insufficient documentation

## 2015-03-13 MED ORDER — TORSEMIDE 20 MG PO TABS
40.0000 mg | ORAL_TABLET | Freq: Two times a day (BID) | ORAL | Status: DC | PRN
Start: 2015-03-13 — End: 2015-06-26

## 2015-03-13 NOTE — Assessment & Plan Note (Signed)
Her weight is playing a major role in her shortness of breath and deconditioning  she's not interested in gastric bypass surgery  she could possibly consider other options such as phentermine  Strongly recommended strict diet, low carbohydrates  She needs to start a low-grade exercise program

## 2015-03-13 NOTE — Assessment & Plan Note (Signed)
Shortness of breath likely multifactorial including her morbid obesity, asthma, anemia, chronic diastolic CHF.  She continues to feel like she has extra fluid and Lasix is not working. Recommended she stop the Lasix, start torsemide 20 mg twice a day.  If her weight increases, would increase up to torsemide 40 mg twice a day

## 2015-03-13 NOTE — Progress Notes (Signed)
Patient ID: [name], female    DOB: 21-Feb-1971, 45 y.o.   MRN: [id number]  HPI Comments: 45 yo morbidly obese woman with uncontrolled diabetes  With history of chronic diastolic CHF, hospitalization October 2016, November 2016 presents to establish care in the [place] office , in for follow-up of her diastolic CHF.   In follow-up today, she reports that she continues on Lasix 40 mg twice a day with potassium  Despite this she continues to have leg edema , shortness of breath on exertion  She attributes some of her symptoms to anemia , asthma.  At baseline, not able to walk very far secondary to symptoms.  She's had difficulty losing weight,  Has not tried weight loss plans her supplements.  She's not particularly interested in gastric bypass surgery.  She does understand that her weight is likely contributing to her symptoms  she does not have a scale at home,  Reports that she eats out    She has had follow-up with oncology for iron supplement.  Low iron felt secondary to heavy menses.  Lab work from October 2016 showing hemoglobin A1c of 13,  Most recent hematocrit improved up to 33   she denies any symptoms of sleep apnea, reports that she had a test several years ago.  Husband who presents with her today denies that she has significant snoring   EKG on today's visit shows normal sinus rhythm with rate 84 bpm, nonspecific T wave abnormality  1 and aVL   other past medical history  normal LV function by echo    Allergies  Allergen Reactions  . Feraheme [Ferumoxytol] Itching    Given pre meds before infusions  . Sulfa Antibiotics Itching    Current Outpatient Prescriptions on File Prior to Visit  Medication Sig Dispense Refill  . acetaminophen (TYLENOL) 325 MG tablet Take 2 tablets (650 mg total) by mouth every 6 (six) hours as needed for mild pain (or Fever >/= 101). 1 tablet 0  . albuterol (PROVENTIL HFA;VENTOLIN HFA) 108 (90 BASE) MCG/ACT inhaler Inhale 2 puffs into  the lungs every 6 (six) hours as needed for wheezing or shortness of breath. 1 Inhaler 0  . albuterol (PROVENTIL) (2.5 MG/3ML) 0.083% nebulizer solution Take 3 mLs (2.5 mg total) by nebulization every 6 (six) hours as needed for wheezing. 75 mL 12  . amLODipine (NORVASC) 10 MG tablet Take 1 tablet (10 mg total) by mouth daily. 30 tablet 0  . aspirin EC 81 MG EC tablet Take 1 tablet (81 mg total) by mouth daily. 30 tablet 0  . budesonide (PULMICORT) 0.25 MG/2ML nebulizer solution Take 2 mLs (0.25 mg total) by nebulization 2 (two) times daily. 60 mL 12  . citalopram (CELEXA) 40 MG tablet Take 40 mg by mouth daily.    . clonazePAM (KLONOPIN) 2 MG tablet Take 2 mg by mouth at bedtime.    . diazepam (VALIUM) 5 MG tablet Take 1 tablet (5 mg total) by mouth every 8 (eight) hours as needed for muscle spasms. 20 tablet 0  . hydrALAZINE (APRESOLINE) 100 MG tablet Take 1 tablet (100 mg total) by mouth every 8 (eight) hours. 60 tablet 0  . insulin aspart protamine- aspart (NOVOLOG MIX 70/30) (70-30) 100 UNIT/ML injection Inject 0.55 mLs (55 Units total) into the skin 2 (two) times daily with a meal. (Patient taking differently: Inject 50 Units into the skin 2 (two) times daily with a meal. ) 10 mL 11  . ipratropium-albuterol (DUONEB) 0.5-2.5 (3)  MG/3ML SOLN Take 3 mLs by nebulization every 6 (six) hours as needed. 360 mL 2  . losartan (COZAAR) 50 MG tablet Take 1 tablet (50 mg total) by mouth daily. 30 tablet 0  . Metoprolol Tartrate 75 MG TABS Take 75 mg by mouth 2 (two) times daily. 60 tablet 0  . potassium chloride (K-DUR,KLOR-CON) 10 MEQ tablet Take 1 tablet (10 mEq total) by mouth 2 (two) times daily. 30 tablet 0  . simvastatin (ZOCOR) 40 MG tablet Take 40 mg by mouth daily.     No current facility-administered medications on file prior to visit.    Past Medical History  Diagnosis Date  . Poorly controlled diabetes mellitus ([place])     a. 11/2014 A1c 13.2.  . Iron deficiency anemia   . Asthma   .  Hypertension   . Pericardial effusion     a. 11/2014 CT Chest: Mod effusion.  . Diabetes mellitus without complication ([place])   . CHF (congestive heart failure) [facility])     Past Surgical History  Procedure Laterality Date  . Cesarean section      Social History  reports that she has never smoked. She does not have any smokeless tobacco history on file. She reports that she drinks alcohol. She reports that she does not use illicit drugs.  Family History family history includes Diabetes in her father and mother; Hypertension in her father and mother.   Review of Systems  Constitutional: Positive for fatigue.  Respiratory: Positive for shortness of breath.   Cardiovascular: Negative.   Gastrointestinal: Negative.   Musculoskeletal: Negative.   Neurological: Negative.   Hematological: Negative.   Psychiatric/Behavioral: Negative.   All other systems reviewed and are negative.     Physical Exam

## 2015-03-13 NOTE — Assessment & Plan Note (Signed)
Blood pressure is well controlled on today's visit. No changes made to the medications. 

## 2015-03-13 NOTE — Patient Instructions (Addendum)
You are doing well.  Weight daily Start torsemide (demedex) one pill twice a day  Ask PMD about weight loss supplement  Rehab starting tomorrow  Please call us if you have new issues that need to be addressed before your next appt.  Your physician wants you to follow-up in: 3 months.  You will receive a reminder letter in the mail two months in advance. If you don't receive a letter, please call our office to schedule the follow-up appointment.

## 2015-03-13 NOTE — Assessment & Plan Note (Signed)
She reports that she has been working with primary care to improve her diabetes numbers.  Again recommended a very strict low carbohydrate diet for weight loss

## 2015-03-13 NOTE — Assessment & Plan Note (Signed)
Managed by hematology/oncology  she has received iron infusions , latest hematocrit 33

## 2015-03-13 NOTE — Assessment & Plan Note (Signed)
I'm concerned that overdiuresis will lead to acute renal  Insufficiency  We have discussed this with her and recommended she proceed very cautiously with torsemide.  She does not feel that the Lasix is working for her.  Grossly no edema on physical examination today. She reports  The fluid is all in her abdomen

## 2015-03-16 ENCOUNTER — Inpatient Hospital Stay: Payer: Medicaid Other | Admitting: Oncology

## 2015-04-17 ENCOUNTER — Encounter: Payer: Medicaid Other | Admitting: Cardiovascular Disease

## 2015-05-02 ENCOUNTER — Encounter: Payer: Medicaid Other | Attending: Internal Medicine | Admitting: Respiratory Therapy

## 2015-05-02 DIAGNOSIS — J4540 Moderate persistent asthma, uncomplicated: Secondary | ICD-10-CM

## 2015-05-02 DIAGNOSIS — I5032 Chronic diastolic (congestive) heart failure: Secondary | ICD-10-CM | POA: Diagnosis present

## 2015-05-02 NOTE — Progress Notes (Signed)
Pulmonary Individual Treatment Plan  Patient Details  Name: [name] MRN: [id number] Date of Birth: 07-12-1970 Referring Provider:  Perrin Maltese, MD  Initial Encounter Date: Date: 05/02/15  Visit Diagnosis: Asthma, moderate persistent, uncomplicated  Chronic diastolic congestive heart failure ([place])  Morbid obesity, unspecified obesity type ([place])  [name] Home Medications on Admission:  Current outpatient prescriptions:  .  acetaminophen (TYLENOL) 325 MG tablet, Take 2 tablets (650 mg total) by mouth every 6 (six) hours as needed for mild pain (or Fever >/= 101)., Disp: 1 tablet, Rfl: 0 .  albuterol (PROVENTIL HFA;VENTOLIN HFA) 108 (90 BASE) MCG/ACT inhaler, Inhale 2 puffs into the lungs every 6 (six) hours as needed for wheezing or shortness of breath., Disp: 1 Inhaler, Rfl: 0 .  albuterol (PROVENTIL) (2.5 MG/3ML) 0.083% nebulizer solution, Take 3 mLs (2.5 mg total) by nebulization every 6 (six) hours as needed for wheezing., Disp: 75 mL, Rfl: 12 .  amLODipine (NORVASC) 10 MG tablet, Take 1 tablet (10 mg total) by mouth daily., Disp: 30 tablet, Rfl: 0 .  aspirin EC 81 MG EC tablet, Take 1 tablet (81 mg total) by mouth daily., Disp: 30 tablet, Rfl: 0 .  budesonide (PULMICORT) 0.25 MG/2ML nebulizer solution, Take 2 mLs (0.25 mg total) by nebulization 2 (two) times daily., Disp: 60 mL, Rfl: 12 .  citalopram (CELEXA) 40 MG tablet, Take 40 mg by mouth daily., Disp: , Rfl:  .  clonazePAM (KLONOPIN) 2 MG tablet, Take 2 mg by mouth at bedtime., Disp: , Rfl:  .  diazepam (VALIUM) 5 MG tablet, Take 1 tablet (5 mg total) by mouth every 8 (eight) hours as needed for muscle spasms., Disp: 20 tablet, Rfl: 0 .  hydrALAZINE (APRESOLINE) 100 MG tablet, Take 1 tablet (100 mg total) by mouth every 8 (eight) hours., Disp: 60 tablet, Rfl: 0 .  insulin aspart protamine- aspart (NOVOLOG MIX 70/30) (70-30) 100 UNIT/ML injection, Inject 0.55 mLs (55 Units total) into the skin 2 (two) times daily with a  meal. (Patient taking differently: Inject 50 Units into the skin 2 (two) times daily with a meal. ), Disp: 10 mL, Rfl: 11 .  ipratropium-albuterol (DUONEB) 0.5-2.5 (3) MG/3ML SOLN, Take 3 mLs by nebulization every 6 (six) hours as needed., Disp: 360 mL, Rfl: 2 .  losartan (COZAAR) 50 MG tablet, Take 1 tablet (50 mg total) by mouth daily., Disp: 30 tablet, Rfl: 0 .  Metoprolol Tartrate 75 MG TABS, Take 75 mg by mouth 2 (two) times daily., Disp: 60 tablet, Rfl: 0 .  potassium chloride (K-DUR,KLOR-CON) 10 MEQ tablet, Take 1 tablet (10 mEq total) by mouth 2 (two) times daily., Disp: 30 tablet, Rfl: 0 .  simvastatin (ZOCOR) 40 MG tablet, Take 40 mg by mouth daily., Disp: , Rfl:  .  torsemide (DEMADEX) 20 MG tablet, Take 2 tablets (40 mg total) by mouth 2 (two) times daily as needed., Disp: 120 tablet, Rfl: 6  Past Medical History: Past Medical History  Diagnosis Date  . Poorly controlled diabetes mellitus ([place])     a. 11/2014 A1c 13.2.  . Iron deficiency anemia   . Asthma   . Hypertension   . Pericardial effusion     a. 11/2014 CT Chest: Mod effusion.  . Diabetes mellitus without complication ([place])   . CHF (congestive heart failure) ([facility])     Tobacco Use: History  Smoking status  . Never Smoker   Smokeless tobacco  . Not on file    Labs: Recent Review Flowsheet  Data    Labs for ITP Cardiac and Pulmonary Rehab Latest Ref Rng 12/15/2014 01/03/2015   Cholestrol 0 - 200 mg/dL - 232(H)   LDLCALC 0 - 99 mg/dL - 169(H)   HDL >40 mg/dL - 41   Trlycerides <150 mg/dL - 112   Hemoglobin A1c 4.0 - 6.0 % 13.2(H) -       ADL UCSD:     ADL UCSD      05/02/15 1330       ADL UCSD   ADL Phase Entry     SOB Score total 91     Rest 2     Walk 3     Stairs 5     Bath 4     Dress 3     Shop 3         Pulmonary Function Assessment:     Pulmonary Function Assessment - 05/02/15 1330    Initial Spirometry Results   FVC% 48 %   FEV1% 45 %   FEV1/FVC Ratio 78.27   Post  Bronchodilator Spirometry Results   FVC% 52 %   FEV1% 45 %   FEV1/FVC Ratio 75.3   Breath   Bilateral Breath Sounds Clear   Shortness of Breath Yes;Fear of Shortness of Breath;Limiting activity      Exercise Target Goals: Date: 05/02/15  Exercise Program Goal: Individual exercise prescription set with THRR, safety & activity barriers. Participant demonstrates ability to understand and report RPE using BORG scale, to self-measure pulse accurately, and to acknowledge the importance of the exercise prescription.  Exercise Prescription Goal: Starting with aerobic activity 30 plus minutes a day, 3 days per week for initial exercise prescription. Provide home exercise prescription and guidelines that participant acknowledges understanding prior to discharge.  Activity Barriers & Risk Stratification:     Activity Barriers & Cardiac Risk Stratification - 05/02/15 1330    Activity Barriers & Cardiac Risk Stratification   Activity Barriers Back Problems;Shortness of Breath;Deconditioning;Muscular Weakness   Cardiac Risk Stratification Moderate      6 Minute Walk:     6 Minute Walk      05/02/15 1645       6 Minute Walk   Phase Initial     Distance 555 feet     Walk Time 4 minutes     # of Rest Breaks 3     RPE 15     Perceived Dyspnea  4     Symptoms No     Resting HR 76 bpm     Resting BP 126/76 mmHg     Max Ex. HR 92 bpm     Max Ex. BP 144/78 mmHg        Initial Exercise Prescription:     Initial Exercise Prescription - 05/02/15 1600    Date of Initial Exercise Prescription   Date 05/02/15   Treadmill   MPH 1.5   Grade 0   Minutes 10   Recumbant Bike   Level 2   RPM 40   Watts 20   Minutes 10   NuStep   Level 2   Watts 40   Minutes 10   Arm Ergometer   Level 1   Watts 10   Minutes 10   Recumbant Elliptical   Level 2   RPM 40   Watts 20   Minutes 10   REL-XR   Level 2   Watts 40   Minutes 10   T5 Nustep   Level 1  Watts 15   Minutes 10    Biostep-RELP   Level 2   Watts 40   Minutes 10   Prescription Details   Frequency (times per week) 3   Duration Progress to 30 minutes of continuous aerobic without signs/symptoms of physical distress   Intensity   THRR REST +  30   Ratings of Perceived Exertion 11-15   Perceived Dyspnea 0-4   Progression   Progression Continue progressive overload as per policy without signs/symptoms or physical distress.   Resistance Training   Training Prescription Yes   Weight 1   Reps 10-15      Exercise Prescription Changes:   Discharge Exercise Prescription (Final Exercise Prescription Changes):    Nutrition:  Target Goals: Understanding of nutrition guidelines, daily intake of sodium 1500mg , cholesterol 200mg , calories 30% from fat and 7% or less from saturated fats, daily to have 5 or more servings of fruits and vegetables.  Biometrics:    Nutrition Therapy Plan and Nutrition Goals:     Nutrition Therapy & Goals - 05/02/15 1330    Nutrition Therapy   Diet Ms Wanninger is diabetic and has meet with a dietitian about 2 years ago. She would like to meet with them again. Her husband does the cooking and is healthy with low salt. Ms Taubman drinks 5-6 hospital cups of water a day.       Nutrition Discharge: Rate Your Plate Scores:   Psychosocial: Target Goals: Acknowledge presence or absence of depression, maximize coping skills, provide positive support system. Participant is able to verbalize types and ability to use techniques and skills needed for reducing stress and depression.  Initial Review & Psychosocial Screening:     Initial Psych Review & Screening - 05/02/15 [place]? Yes   Comments Ms Varney had good family support from her husband and children. She is anxious to feel better and be more active since her 2 admissions for CHF last October 2016.   Barriers   Psychosocial barriers to participate in program The patient should  benefit from training in stress management and relaxation.   Screening Interventions   Interventions Encouraged to exercise;Program counselor consult      Quality of Life Scores:     Quality of Life - 05/02/15 1330    Quality of Life Scores   Health/Function Pre 10.84 %   Socioeconomic Pre 11.86 %   Psych/Spiritual Pre 13.93 %   Family Pre 16.8 %   GLOBAL Pre 12.51 %      PHQ-9:     Recent Review Flowsheet Data    Depression screen [facility] 2/9 05/02/2015   Decreased Interest 2   Down, Depressed, Hopeless 1   PHQ - 2 Score 3   Altered sleeping 2   Tired, decreased energy 3   Change in appetite 1   Feeling bad or failure about yourself  2   Trouble concentrating 2   Moving slowly or fidgety/restless 1   Suicidal thoughts 1   PHQ-9 Score 15   Difficult doing work/chores Somewhat difficult      Psychosocial Evaluation and Intervention:   Psychosocial Re-Evaluation:  Education: Education Goals: Education classes will be provided on a weekly basis, covering required topics. Participant will state understanding/return demonstration of topics presented.  Learning Barriers/Preferences:     Learning Barriers/Preferences - 05/02/15 1330    Learning Barriers/Preferences   Learning Barriers None   Learning Preferences Group Instruction;Individual Instruction;Pictoral;Skilled Demonstration;Verbal Instruction;Video;Written  Material      Education Topics: Initial Evaluation Education: - Verbal, written and demonstration of respiratory meds, RPE/PD scales, oximetry and breathing techniques. Instruction on use of nebulizers and MDIs: cleaning and proper use, rinsing mouth with steroid doses and importance of monitoring MDI activations.          Pulmonary Rehab from 05/02/2015 in [facility] Cardiac and Pulmonary Rehab   Date  05/02/15   Educator  LB   Instruction Review Code  2- meets goals/outcomes      General Nutrition Guidelines/Fats and Fiber: -Group instruction provided by  verbal, written material, models and posters to present the general guidelines for heart healthy nutrition. Gives an explanation and review of dietary fats and fiber.   Controlling Sodium/Reading Food Labels: -Group verbal and written material supporting the discussion of sodium use in heart healthy nutrition. Review and explanation with models, verbal and written materials for utilization of the food label.   Exercise Physiology & Risk Factors: - Group verbal and written instruction with models to review the exercise physiology of the cardiovascular system and associated critical values. Details cardiovascular disease risk factors and the goals associated with each risk factor.   Aerobic Exercise & Resistance Training: - Gives group verbal and written discussion on the health impact of inactivity. On the components of aerobic and resistive training programs and the benefits of this training and how to safely progress through these programs.   Flexibility, Balance, General Exercise Guidelines: - Provides group verbal and written instruction on the benefits of flexibility and balance training programs. Provides general exercise guidelines with specific guidelines to those with heart or lung disease. Demonstration and skill practice provided.   Stress Management: - Provides group verbal and written instruction about the health risks of elevated stress, cause of high stress, and healthy ways to reduce stress.   Depression: - Provides group verbal and written instruction on the correlation between heart/lung disease and depressed mood, treatment options, and the stigmas associated with seeking treatment.   Exercise & Equipment Safety: - Individual verbal instruction and demonstration of equipment use and safety with use of the equipment.   Infection Prevention: - Provides verbal and written material to individual with discussion of infection control including proper hand washing and  proper equipment cleaning during exercise session.   Falls Prevention: - Provides verbal and written material to individual with discussion of falls prevention and safety.      Pulmonary Rehab from 05/02/2015 in [facility] Cardiac and Pulmonary Rehab   Date  05/02/15   Educator  LB   Instruction Review Code  2- meets goals/outcomes      Diabetes: - Individual verbal and written instruction to review signs/symptoms of diabetes, desired ranges of glucose level fasting, after meals and with exercise. Advice that pre and post exercise glucose checks will be done for 3 sessions at entry of program.   Chronic Lung Diseases: - Group verbal and written instruction to review new updates, new respiratory medications, new advancements in procedures and treatments. Provide informative websites and "800" numbers of self-education.   Lung Procedures: - Group verbal and written instruction to describe testing methods done to diagnose lung disease. Review the outcome of test results. Describe the treatment choices: Pulmonary Function Tests, ABGs and oximetry.   Energy Conservation: - Provide group verbal and written instruction for methods to conserve energy, plan and organize activities. Instruct on pacing techniques, use of adaptive equipment and posture/positioning to relieve shortness of breath.   Triggers: - Group  verbal and written instruction to review types of environmental controls: home humidity, furnaces, filters, dust mite/pet prevention, HEPA vacuums. To discuss weather changes, air quality and the benefits of nasal washing.   Exacerbations: - Group verbal and written instruction to provide: warning signs, infection symptoms, calling MD promptly, preventive modes, and value of vaccinations. Review: effective airway clearance, coughing and/or vibration techniques. Create an Sports administrator.   Oxygen: - Individual and group verbal and written instruction on oxygen therapy. Includes supplement  oxygen, available portable oxygen systems, continuous and intermittent flow rates, oxygen safety, concentrators, and Medicare reimbursement for oxygen.   Respiratory Medications: - Group verbal and written instruction to review medications for lung disease. Drug class, frequency, complications, importance of spacers, rinsing mouth after steroid MDI's, and proper cleaning methods for nebulizers.      Pulmonary Rehab from 05/02/2015 in [facility] Cardiac and Pulmonary Rehab   Date  05/02/15   Educator  LB   Instruction Review Code  2- meets goals/outcomes      AED/CPR: - Group verbal and written instruction with the use of models to demonstrate the basic use of the AED with the basic ABC's of resuscitation.   Breathing Retraining: - Provides individuals verbal and written instruction on purpose, frequency, and proper technique of diaphragmatic breathing and pursed-lipped breathing. Applies individual practice skills.      Pulmonary Rehab from 05/02/2015 in [facility] Cardiac and Pulmonary Rehab   Date  05/02/15   Educator  LB   Instruction Review Code  2- meets goals/outcomes      Anatomy and Physiology of the Lungs: - Group verbal and written instruction with the use of models to provide basic lung anatomy and physiology related to function, structure and complications of lung disease.   Heart Failure: - Group verbal and written instruction on the basics of heart failure: signs/symptoms, treatments, explanation of ejection fraction, enlarged heart and cardiomyopathy.   Sleep Apnea: - Individual verbal and written instruction to review Obstructive Sleep Apnea. Review of risk factors, methods for diagnosing and types of masks and machines for OSA.   Anxiety: - Provides group, verbal and written instruction on the correlation between heart/lung disease and anxiety, treatment options, and management of anxiety.   Relaxation: - Provides group, verbal and written instruction about the benefits of  relaxation for patients with heart/lung disease. Also provides patients with examples of relaxation techniques.   Knowledge Questionnaire Score:     Knowledge Questionnaire Score - 05/02/15 1330    Knowledge Questionnaire Score   Pre Score 10/10      Personal Goals and Risk Factors at Admission:     Personal Goals and Risk Factors at Admission - 05/02/15 1330    Core Components/Risk Factors/Patient Goals on Admission    Weight Management Yes;Obesity   Intervention Weight Management: Develop a combined nutrition and exercise program designed to reach desired caloric intake, while maintaining appropriate intake of nutrient and fiber, sodium and fats, and appropriate energy expenditure required for the weight goal.;Weight Management: Provide education and appropriate resources to help participant work on and attain dietary goals.;Weight Management/Obesity: Establish reasonable short term and long term weight goals.;Obesity: Provide education and appropriate resources to help participant work on and attain dietary goals.   Admit Weight 345 lb 3.2 oz (156.582 kg)   Goal Weight: Short Term 300 lb (136.079 kg)   Goal Weight: Long Term 220 lb (99.791 kg)   Expected Outcomes Short Term: Continue to assess and modify interventions until short term weight is  achieved.;Long Term: Adherence to nutrition and physical activity/exercise program aimed toward attainment of established weight goal.   Sedentary Yes  Ms Fedora likes to walk at the mall.   Intervention Provide advice, education, support and counseling about physical activity/exercise needs.;Develop an individualized exercise prescription for aerobic and resistive training based on initial evaluation findings, risk stratification, comorbidities and participant's personal goals.   Expected Outcomes Achievement of increased cardiorespiratory fitness and enhanced flexibility, muscular endurance and strength shown through measurements of functional  capaciy and personal statement of participant.   Improve shortness of breath with ADL's Yes  Ms Bina had a high score on her SOB questionnaire and does have fear of shortness of breath.   Intervention Provide education, individualized exercise plan and daily activity instruction to help decrease symptoms of SOB with activities of daily living.   Expected Outcomes Short Term: Achieves a reduction of symptoms when performing activities of daily living.   Develop more efficient breathing techniques such as purse lipped breathing and diaphragmatic breathing; and practicing self-pacing with activity Yes   Intervention Provide education, demonstration and support about specific breathing techniuqes utilized for more efficient breathing. Include techniques such as pursed lipped breathing, diaphragmatic breathing and self-pacing activity.   Expected Outcomes Short Term: Participant will be able to demonstrate and use breathing techniques as needed throughout daily activities.   Increase knowledge of respiratory medications and ability to use respiratory devices properly  Yes   Intervention Provide education and demonstration as needed of appropriate use of medications, inhalers, and oxygen therapy.  Ms Collens uses Duoneb and Pulmicort SVN; and ProAir MDI; spacer was given with instructions.   Expected Outcomes Short Term: Achieves understanding of medications use. Understands that oxygen is a medication prescribed by physician. Demonstrates appropriate use of inhaler and oxygen therapy.   Diabetes Yes   Intervention Provide education about signs/symptoms and action to take for hypo/hyperglycemia.;Provide education about proper nutrition, including hydration, and aerobic/resistive exercise prescription along with prescribed medications to achieve blood glucose in normal ranges: Fasting glucose 65-99 mg/dL   Expected Outcomes Short Term: Participant verbalizes understanding of the signs/symptoms and  immediate care of hyper/hypoglycemia, proper foot care and importance of medication, aerobic/resistive exercise and nutrition plan for blood glucose control.;Long Term: Attainment of HbA1C < 7%.   Hypertension Yes   Intervention Provide education on lifestyle modifcations including regular physical activity/exercise, weight management, moderate sodium restriction and increased consumption of fresh fruit, vegetables, and low fat dairy, alcohol moderation, and smoking cessation.;Monitor prescription use compliance.   Expected Outcomes Short Term: Continued assessment and intervention until BP is < 140/34mm HG in hypertensive participants. < 130/32mm HG in hypertensive participants with diabetes, heart failure or chronic kidney disease.;Long Term: Maintenance of blood pressure at goal levels.      Personal Goals and Risk Factors Review:    Personal Goals Discharge (Final Personal Goals and Risk Factors Review):    ITP Comments:   Comments:

## 2015-05-02 NOTE — Patient Instructions (Signed)
Patient Instructions  Patient Details  Name: [name] MRN: [id number] Date of Birth: [birth year] Referring Provider:  Perrin Maltese, MD  Below are the personal goals you chose as well as exercise and nutrition goals. Our goal is to help you keep on track towards obtaining and maintaining your goals. We will be discussing your progress on these goals with you throughout the program.  Initial Exercise Prescription:     Initial Exercise Prescription - 05/02/15 1600    Date of Initial Exercise Prescription   Date 05/02/15   Treadmill   MPH 1.5   Grade 0   Minutes 10   Recumbant Bike   Level 2   RPM 40   Watts 20   Minutes 10   NuStep   Level 2   Watts 40   Minutes 10   Arm Ergometer   Level 1   Watts 10   Minutes 10   Recumbant Elliptical   Level 2   RPM 40   Watts 20   Minutes 10   REL-XR   Level 2   Watts 40   Minutes 10   T5 Nustep   Level 1   Watts 15   Minutes 10   Biostep-RELP   Level 2   Watts 40   Minutes 10   Prescription Details   Frequency (times per week) 3   Duration Progress to 30 minutes of continuous aerobic without signs/symptoms of physical distress   Intensity   THRR REST +  30   Ratings of Perceived Exertion 11-15   Perceived Dyspnea 0-4   Progression   Progression Continue progressive overload as per policy without signs/symptoms or physical distress.   Resistance Training   Training Prescription Yes   Weight 1   Reps 10-15      Exercise Goals: Frequency: Be able to perform aerobic exercise three times per week working toward 3-5 days per week.  Intensity: Work with a perceived exertion of 11 (fairly light) - 15 (hard) as tolerated. Follow your new exercise prescription and watch for changes in prescription as you progress with the program. Changes will be reviewed with you when they are made.  Duration: You should be able to do 30 minutes of continuous aerobic exercise in addition to a 5 minute warm-up and a 5 minute  cool-down routine.  Nutrition Goals: Your personal nutrition goals will be established when you do your nutrition analysis with the dietician.  The following are nutrition guidelines to follow: Cholesterol < 200mg /day Sodium < 1500mg /day Fiber: Women under 50 yrs - 25 grams per day  Personal Goals:     Personal Goals and Risk Factors at Admission - 05/02/15 1330    Core Components/Risk Factors/Patient Goals on Admission    Weight Management Yes;Obesity   Intervention Weight Management: Develop a combined nutrition and exercise program designed to reach desired caloric intake, while maintaining appropriate intake of nutrient and fiber, sodium and fats, and appropriate energy expenditure required for the weight goal.;Weight Management: Provide education and appropriate resources to help participant work on and attain dietary goals.;Weight Management/Obesity: Establish reasonable short term and long term weight goals.;Obesity: Provide education and appropriate resources to help participant work on and attain dietary goals.   Admit Weight 345 lb 3.2 oz (156.582 kg)   Goal Weight: Short Term 300 lb (136.079 kg)   Goal Weight: Long Term 220 lb (99.791 kg)   Expected Outcomes Short Term: Continue to assess and modify interventions until short term weight  is achieved.;Long Term: Adherence to nutrition and physical activity/exercise program aimed toward attainment of established weight goal.   Sedentary Yes  Ms Munir likes to walk at the mall.   Intervention Provide advice, education, support and counseling about physical activity/exercise needs.;Develop an individualized exercise prescription for aerobic and resistive training based on initial evaluation findings, risk stratification, comorbidities and participant's personal goals.   Expected Outcomes Achievement of increased cardiorespiratory fitness and enhanced flexibility, muscular endurance and strength shown through measurements of functional  capaciy and personal statement of participant.   Improve shortness of breath with ADL's Yes  Ms Moland had a high score on her SOB questionnaire and does have fear of shortness of breath.   Intervention Provide education, individualized exercise plan and daily activity instruction to help decrease symptoms of SOB with activities of daily living.   Expected Outcomes Short Term: Achieves a reduction of symptoms when performing activities of daily living.   Develop more efficient breathing techniques such as purse lipped breathing and diaphragmatic breathing; and practicing self-pacing with activity Yes   Intervention Provide education, demonstration and support about specific breathing techniuqes utilized for more efficient breathing. Include techniques such as pursed lipped breathing, diaphragmatic breathing and self-pacing activity.   Expected Outcomes Short Term: Participant will be able to demonstrate and use breathing techniques as needed throughout daily activities.   Increase knowledge of respiratory medications and ability to use respiratory devices properly  Yes   Intervention Provide education and demonstration as needed of appropriate use of medications, inhalers, and oxygen therapy.  Ms Javorsky uses Duoneb and Pulmicort SVN; and ProAir MDI; spacer was given with instructions.   Expected Outcomes Short Term: Achieves understanding of medications use. Understands that oxygen is a medication prescribed by physician. Demonstrates appropriate use of inhaler and oxygen therapy.   Diabetes Yes   Intervention Provide education about signs/symptoms and action to take for hypo/hyperglycemia.;Provide education about proper nutrition, including hydration, and aerobic/resistive exercise prescription along with prescribed medications to achieve blood glucose in normal ranges: Fasting glucose 65-99 mg/dL   Expected Outcomes Short Term: Participant verbalizes understanding of the signs/symptoms and  immediate care of hyper/hypoglycemia, proper foot care and importance of medication, aerobic/resistive exercise and nutrition plan for blood glucose control.;Long Term: Attainment of HbA1C < 7%.   Hypertension Yes   Intervention Provide education on lifestyle modifcations including regular physical activity/exercise, weight management, moderate sodium restriction and increased consumption of fresh fruit, vegetables, and low fat dairy, alcohol moderation, and smoking cessation.;Monitor prescription use compliance.   Expected Outcomes Short Term: Continued assessment and intervention until BP is < 140/35mm HG in hypertensive participants. < 130/2mm HG in hypertensive participants with diabetes, heart failure or chronic kidney disease.;Long Term: Maintenance of blood pressure at goal levels.      Tobacco Use Initial Evaluation: History  Smoking status  . Never Smoker   Smokeless tobacco  . Not on file    Copy of goals given to participant.

## 2015-05-03 NOTE — Progress Notes (Signed)
Pulmonary Individual Treatment Plan  Patient Details  Name: [name] MRN: [id number] Date of Birth: 1970-06-11 Referring Provider:  Perrin Maltese, MD  Initial Encounter Date: Date: 05/02/15  Visit Diagnosis: Asthma, moderate persistent, uncomplicated - Plan: PULMONARY REHAB 30 DAY REVIEW  Chronic diastolic congestive heart failure ([place]) - Plan: PULMONARY REHAB 32 DAY REVIEW  Morbid obesity, unspecified obesity type ([place]) - Plan: PULMONARY REHAB 30 DAY REVIEW  [name] Home Medications on Admission:  Current outpatient prescriptions:    acetaminophen (TYLENOL) 325 MG tablet, Take 2 tablets (650 mg total) by mouth every 6 (six) hours as needed for mild pain (or Fever >/= 101)., Disp: 1 tablet, Rfl: 0   albuterol (PROVENTIL HFA;VENTOLIN HFA) 108 (90 BASE) MCG/ACT inhaler, Inhale 2 puffs into the lungs every 6 (six) hours as needed for wheezing or shortness of breath., Disp: 1 Inhaler, Rfl: 0   albuterol (PROVENTIL) (2.5 MG/3ML) 0.083% nebulizer solution, Take 3 mLs (2.5 mg total) by nebulization every 6 (six) hours as needed for wheezing., Disp: 75 mL, Rfl: 12   amLODipine (NORVASC) 10 MG tablet, Take 1 tablet (10 mg total) by mouth daily., Disp: 30 tablet, Rfl: 0   aspirin EC 81 MG EC tablet, Take 1 tablet (81 mg total) by mouth daily., Disp: 30 tablet, Rfl: 0   budesonide (PULMICORT) 0.25 MG/2ML nebulizer solution, Take 2 mLs (0.25 mg total) by nebulization 2 (two) times daily., Disp: 60 mL, Rfl: 12   citalopram (CELEXA) 40 MG tablet, Take 40 mg by mouth daily., Disp: , Rfl:    clonazePAM (KLONOPIN) 2 MG tablet, Take 2 mg by mouth at bedtime., Disp: , Rfl:    diazepam (VALIUM) 5 MG tablet, Take 1 tablet (5 mg total) by mouth every 8 (eight) hours as needed for muscle spasms., Disp: 20 tablet, Rfl: 0   hydrALAZINE (APRESOLINE) 100 MG tablet, Take 1 tablet (100 mg total) by mouth every 8 (eight) hours., Disp: 60 tablet, Rfl: 0   insulin aspart protamine- aspart (NOVOLOG MIX  70/30) (70-30) 100 UNIT/ML injection, Inject 0.55 mLs (55 Units total) into the skin 2 (two) times daily with a meal. (Patient taking differently: Inject 50 Units into the skin 2 (two) times daily with a meal. ), Disp: 10 mL, Rfl: 11   ipratropium-albuterol (DUONEB) 0.5-2.5 (3) MG/3ML SOLN, Take 3 mLs by nebulization every 6 (six) hours as needed., Disp: 360 mL, Rfl: 2   losartan (COZAAR) 50 MG tablet, Take 1 tablet (50 mg total) by mouth daily., Disp: 30 tablet, Rfl: 0   Metoprolol Tartrate 75 MG TABS, Take 75 mg by mouth 2 (two) times daily., Disp: 60 tablet, Rfl: 0   potassium chloride (K-DUR,KLOR-CON) 10 MEQ tablet, Take 1 tablet (10 mEq total) by mouth 2 (two) times daily., Disp: 30 tablet, Rfl: 0   simvastatin (ZOCOR) 40 MG tablet, Take 40 mg by mouth daily., Disp: , Rfl:    torsemide (DEMADEX) 20 MG tablet, Take 2 tablets (40 mg total) by mouth 2 (two) times daily as needed., Disp: 120 tablet, Rfl: 6  Past Medical History: Past Medical History  Diagnosis Date   Poorly controlled diabetes mellitus ([place])     a. 11/2014 A1c 13.2.   Iron deficiency anemia    Asthma    Hypertension    Pericardial effusion     a. 11/2014 CT Chest: Mod effusion.   Diabetes mellitus without complication (HCC)    CHF (congestive heart failure) (H[facility])     Tobacco Use: History  Smoking  status   Never Smoker   Smokeless tobacco   Not on file    Labs: Recent Review Flowsheet Data    Labs for ITP Cardiac and Pulmonary Rehab Latest Ref Rng 12/15/2014 01/03/2015   Cholestrol 0 - 200 mg/dL - 232(H)   LDLCALC 0 - 99 mg/dL - 169(H)   HDL >40 mg/dL - 41   Trlycerides <150 mg/dL - 112   Hemoglobin A1c 4.0 - 6.0 % 13.2(H) -       ADL UCSD:     ADL UCSD      05/02/15 1330       ADL UCSD   ADL Phase Entry     SOB Score total 91     Rest 2     Walk 3     Stairs 5     Bath 4     Dress 3     Shop 3         Pulmonary Function Assessment:     Pulmonary Function Assessment -  05/02/15 1330    Initial Spirometry Results   FVC% 48 %   FEV1% 45 %   FEV1/FVC Ratio 78.27   Post Bronchodilator Spirometry Results   FVC% 52 %   FEV1% 45 %   FEV1/FVC Ratio 75.3   Breath   Bilateral Breath Sounds Clear   Shortness of Breath Yes;Fear of Shortness of Breath;Limiting activity      Exercise Target Goals: Date: 05/02/15  Exercise Program Goal: Individual exercise prescription set with THRR, safety & activity barriers. Participant demonstrates ability to understand and report RPE using BORG scale, to self-measure pulse accurately, and to acknowledge the importance of the exercise prescription.  Exercise Prescription Goal: Starting with aerobic activity 30 plus minutes a day, 3 days per week for initial exercise prescription. Provide home exercise prescription and guidelines that participant acknowledges understanding prior to discharge.  Activity Barriers & Risk Stratification:     Activity Barriers & Cardiac Risk Stratification - 05/02/15 1330    Activity Barriers & Cardiac Risk Stratification   Activity Barriers Back Problems;Shortness of Breath;Deconditioning;Muscular Weakness   Cardiac Risk Stratification Moderate      6 Minute Walk:     6 Minute Walk      05/02/15 1645       6 Minute Walk   Phase Initial     Distance 555 feet     Walk Time 4 minutes     # of Rest Breaks 3     RPE 15     Perceived Dyspnea  4     Symptoms No     Resting HR 76 bpm     Resting BP 126/76 mmHg     Max Ex. HR 92 bpm     Max Ex. BP 144/78 mmHg        Initial Exercise Prescription:     Initial Exercise Prescription - 05/02/15 1600    Date of Initial Exercise Prescription   Date 05/02/15   Treadmill   MPH 1.5   Grade 0   Minutes 10   Recumbant Bike   Level 2   RPM 40   Watts 20   Minutes 10   NuStep   Level 2   Watts 40   Minutes 10   Arm Ergometer   Level 1   Watts 10   Minutes 10   Recumbant Elliptical   Level 2   RPM 40   Watts 20   Minutes  10  REL-XR   Level 2   Watts 40   Minutes 10   T5 Nustep   Level 1   Watts 15   Minutes 10   Biostep-RELP   Level 2   Watts 40   Minutes 10   Prescription Details   Frequency (times per week) 3   Duration Progress to 30 minutes of continuous aerobic without signs/symptoms of physical distress   Intensity   THRR REST +  30   Ratings of Perceived Exertion 11-15   Perceived Dyspnea 0-4   Progression   Progression Continue progressive overload as per policy without signs/symptoms or physical distress.   Resistance Training   Training Prescription Yes   Weight 1   Reps 10-15      Exercise Prescription Changes:   Discharge Exercise Prescription (Final Exercise Prescription Changes):    Nutrition:  Target Goals: Understanding of nutrition guidelines, daily intake of sodium 1500mg , cholesterol 200mg , calories 30% from fat and 7% or less from saturated fats, daily to have 5 or more servings of fruits and vegetables.  Biometrics:    Nutrition Therapy Plan and Nutrition Goals:     Nutrition Therapy & Goals - 05/02/15 1330    Nutrition Therapy   Diet Ms Sweezey is diabetic and has meet with a dietitian about 2 years ago. She would like to meet with them again. Her husband does the cooking and is healthy with low salt. Ms Mathewson drinks 5-6 hospital cups of water a day.       Nutrition Discharge: Rate Your Plate Scores:   Psychosocial: Target Goals: Acknowledge presence or absence of depression, maximize coping skills, provide positive support system. Participant is able to verbalize types and ability to use techniques and skills needed for reducing stress and depression.  Initial Review & Psychosocial Screening:     Initial Psych Review & Screening - 05/02/15 [place]? Yes   Comments Ms Wrege had good family support from her husband and children. She is anxious to feel better and be more active since her 2 admissions for  CHF last October 2016.   Barriers   Psychosocial barriers to participate in program The patient should benefit from training in stress management and relaxation.   Screening Interventions   Interventions Encouraged to exercise;Program counselor consult      Quality of Life Scores:     Quality of Life - 05/02/15 1330    Quality of Life Scores   Health/Function Pre 10.84 %   Socioeconomic Pre 11.86 %   Psych/Spiritual Pre 13.93 %   Family Pre 16.8 %   GLOBAL Pre 12.51 %      PHQ-9:     Recent Review Flowsheet Data    Depression screen [facility] 2/9 05/02/2015   Decreased Interest 2   Down, Depressed, Hopeless 1   PHQ - 2 Score 3   Altered sleeping 2   Tired, decreased energy 3   Change in appetite 1   Feeling bad or failure about yourself  2   Trouble concentrating 2   Moving slowly or fidgety/restless 1   Suicidal thoughts 1   PHQ-9 Score 15   Difficult doing work/chores Somewhat difficult      Psychosocial Evaluation and Intervention:   Psychosocial Re-Evaluation:  Education: Education Goals: Education classes will be provided on a weekly basis, covering required topics. Participant will state understanding/return demonstration of topics presented.  Learning Barriers/Preferences:     Learning Barriers/Preferences -  05/02/15 1330    Learning Barriers/Preferences   Learning Barriers None   Learning Preferences Group Instruction;Individual Instruction;Pictoral;Skilled Demonstration;Verbal Instruction;Video;Written Material      Education Topics: Initial Evaluation Education: - Verbal, written and demonstration of respiratory meds, RPE/PD scales, oximetry and breathing techniques. Instruction on use of nebulizers and MDIs: cleaning and proper use, rinsing mouth with steroid doses and importance of monitoring MDI activations.          Pulmonary Rehab from 05/02/2015 in [facility] Cardiac and Pulmonary Rehab   Date  05/02/15   Educator  LB   Instruction Review Code  2-  meets goals/outcomes      General Nutrition Guidelines/Fats and Fiber: -Group instruction provided by verbal, written material, models and posters to present the general guidelines for heart healthy nutrition. Gives an explanation and review of dietary fats and fiber.   Controlling Sodium/Reading Food Labels: -Group verbal and written material supporting the discussion of sodium use in heart healthy nutrition. Review and explanation with models, verbal and written materials for utilization of the food label.   Exercise Physiology & Risk Factors: - Group verbal and written instruction with models to review the exercise physiology of the cardiovascular system and associated critical values. Details cardiovascular disease risk factors and the goals associated with each risk factor.   Aerobic Exercise & Resistance Training: - Gives group verbal and written discussion on the health impact of inactivity. On the components of aerobic and resistive training programs and the benefits of this training and how to safely progress through these programs.   Flexibility, Balance, General Exercise Guidelines: - Provides group verbal and written instruction on the benefits of flexibility and balance training programs. Provides general exercise guidelines with specific guidelines to those with heart or lung disease. Demonstration and skill practice provided.   Stress Management: - Provides group verbal and written instruction about the health risks of elevated stress, cause of high stress, and healthy ways to reduce stress.   Depression: - Provides group verbal and written instruction on the correlation between heart/lung disease and depressed mood, treatment options, and the stigmas associated with seeking treatment.   Exercise & Equipment Safety: - Individual verbal instruction and demonstration of equipment use and safety with use of the equipment.   Infection Prevention: - Provides verbal and  written material to individual with discussion of infection control including proper hand washing and proper equipment cleaning during exercise session.   Falls Prevention: - Provides verbal and written material to individual with discussion of falls prevention and safety.      Pulmonary Rehab from 05/02/2015 in [facility] Cardiac and Pulmonary Rehab   Date  05/02/15   Educator  LB   Instruction Review Code  2- meets goals/outcomes      Diabetes: - Individual verbal and written instruction to review signs/symptoms of diabetes, desired ranges of glucose level fasting, after meals and with exercise. Advice that pre and post exercise glucose checks will be done for 3 sessions at entry of program.   Chronic Lung Diseases: - Group verbal and written instruction to review new updates, new respiratory medications, new advancements in procedures and treatments. Provide informative websites and "800" numbers of self-education.   Lung Procedures: - Group verbal and written instruction to describe testing methods done to diagnose lung disease. Review the outcome of test results. Describe the treatment choices: Pulmonary Function Tests, ABGs and oximetry.   Energy Conservation: - Provide group verbal and written instruction for methods to conserve energy, plan and organize  activities. Instruct on pacing techniques, use of adaptive equipment and posture/positioning to relieve shortness of breath.   Triggers: - Group verbal and written instruction to review types of environmental controls: home humidity, furnaces, filters, dust mite/pet prevention, HEPA vacuums. To discuss weather changes, air quality and the benefits of nasal washing.   Exacerbations: - Group verbal and written instruction to provide: warning signs, infection symptoms, calling MD promptly, preventive modes, and value of vaccinations. Review: effective airway clearance, coughing and/or vibration techniques. Create an Advertising copywriter.   Oxygen: - Individual and group verbal and written instruction on oxygen therapy. Includes supplement oxygen, available portable oxygen systems, continuous and intermittent flow rates, oxygen safety, concentrators, and Medicare reimbursement for oxygen.   Respiratory Medications: - Group verbal and written instruction to review medications for lung disease. Drug class, frequency, complications, importance of spacers, rinsing mouth after steroid MDI's, and proper cleaning methods for nebulizers.      Pulmonary Rehab from 05/02/2015 in [facility] Cardiac and Pulmonary Rehab   Date  05/02/15   Educator  LB   Instruction Review Code  2- meets goals/outcomes      AED/CPR: - Group verbal and written instruction with the use of models to demonstrate the basic use of the AED with the basic ABC's of resuscitation.   Breathing Retraining: - Provides individuals verbal and written instruction on purpose, frequency, and proper technique of diaphragmatic breathing and pursed-lipped breathing. Applies individual practice skills.      Pulmonary Rehab from 05/02/2015 in [facility] Cardiac and Pulmonary Rehab   Date  05/02/15   Educator  LB   Instruction Review Code  2- meets goals/outcomes      Anatomy and Physiology of the Lungs: - Group verbal and written instruction with the use of models to provide basic lung anatomy and physiology related to function, structure and complications of lung disease.   Heart Failure: - Group verbal and written instruction on the basics of heart failure: signs/symptoms, treatments, explanation of ejection fraction, enlarged heart and cardiomyopathy.   Sleep Apnea: - Individual verbal and written instruction to review Obstructive Sleep Apnea. Review of risk factors, methods for diagnosing and types of masks and machines for OSA.   Anxiety: - Provides group, verbal and written instruction on the correlation between heart/lung disease and anxiety, treatment options, and  management of anxiety.   Relaxation: - Provides group, verbal and written instruction about the benefits of relaxation for patients with heart/lung disease. Also provides patients with examples of relaxation techniques.   Knowledge Questionnaire Score:     Knowledge Questionnaire Score - 05/02/15 1330    Knowledge Questionnaire Score   Pre Score 10/10      Personal Goals and Risk Factors at Admission:     Personal Goals and Risk Factors at Admission - 05/02/15 1330    Core Components/Risk Factors/Patient Goals on Admission    Weight Management Yes;Obesity   Intervention Weight Management: Develop a combined nutrition and exercise program designed to reach desired caloric intake, while maintaining appropriate intake of nutrient and fiber, sodium and fats, and appropriate energy expenditure required for the weight goal.;Weight Management: Provide education and appropriate resources to help participant work on and attain dietary goals.;Weight Management/Obesity: Establish reasonable short term and long term weight goals.;Obesity: Provide education and appropriate resources to help participant work on and attain dietary goals.   Admit Weight 345 lb 3.2 oz (156.582 kg)   Goal Weight: Short Term 300 lb (136.079 kg)   Goal Weight: Long Term  220 lb (99.791 kg)   Expected Outcomes Short Term: Continue to assess and modify interventions until short term weight is achieved.;Long Term: Adherence to nutrition and physical activity/exercise program aimed toward attainment of established weight goal.   Sedentary Yes  Ms Stbernard likes to walk at the mall.   Intervention Provide advice, education, support and counseling about physical activity/exercise needs.;Develop an individualized exercise prescription for aerobic and resistive training based on initial evaluation findings, risk stratification, comorbidities and participant's personal goals.   Expected Outcomes Achievement of increased  cardiorespiratory fitness and enhanced flexibility, muscular endurance and strength shown through measurements of functional capaciy and personal statement of participant.   Improve shortness of breath with ADL's Yes  Ms Kasprzak had a high score on her SOB questionnaire and does have fear of shortness of breath.   Intervention Provide education, individualized exercise plan and daily activity instruction to help decrease symptoms of SOB with activities of daily living.   Expected Outcomes Short Term: Achieves a reduction of symptoms when performing activities of daily living.   Develop more efficient breathing techniques such as purse lipped breathing and diaphragmatic breathing; and practicing self-pacing with activity Yes   Intervention Provide education, demonstration and support about specific breathing techniuqes utilized for more efficient breathing. Include techniques such as pursed lipped breathing, diaphragmatic breathing and self-pacing activity.   Expected Outcomes Short Term: Participant will be able to demonstrate and use breathing techniques as needed throughout daily activities.   Increase knowledge of respiratory medications and ability to use respiratory devices properly  Yes   Intervention Provide education and demonstration as needed of appropriate use of medications, inhalers, and oxygen therapy.  Ms Lilla uses Duoneb and Pulmicort SVN; and ProAir MDI; spacer was given with instructions.   Expected Outcomes Short Term: Achieves understanding of medications use. Understands that oxygen is a medication prescribed by physician. Demonstrates appropriate use of inhaler and oxygen therapy.   Diabetes Yes   Intervention Provide education about signs/symptoms and action to take for hypo/hyperglycemia.;Provide education about proper nutrition, including hydration, and aerobic/resistive exercise prescription along with prescribed medications to achieve blood glucose in normal ranges: Fasting  glucose 65-99 mg/dL   Expected Outcomes Short Term: Participant verbalizes understanding of the signs/symptoms and immediate care of hyper/hypoglycemia, proper foot care and importance of medication, aerobic/resistive exercise and nutrition plan for blood glucose control.;Long Term: Attainment of HbA1C < 7%.   Hypertension Yes   Intervention Provide education on lifestyle modifcations including regular physical activity/exercise, weight management, moderate sodium restriction and increased consumption of fresh fruit, vegetables, and low fat dairy, alcohol moderation, and smoking cessation.;Monitor prescription use compliance.   Expected Outcomes Short Term: Continued assessment and intervention until BP is < 140/65mm HG in hypertensive participants. < 130/38mm HG in hypertensive participants with diabetes, heart failure or chronic kidney disease.;Long Term: Maintenance of blood pressure at goal levels.      Personal Goals and Risk Factors Review:    Personal Goals Discharge (Final Personal Goals and Risk Factors Review):    ITP Comments:   Comments: Ms Hughett plans to start [place] 05/08/2015 and will attend 3 days/week.

## 2015-05-08 ENCOUNTER — Telehealth: Payer: Self-pay

## 2015-05-08 ENCOUNTER — Ambulatory Visit: Payer: Medicaid Other

## 2015-05-08 NOTE — Telephone Encounter (Signed)
Patient called and stated that she is not feeling well, having trouble with her breathing.  Instructed to call 911 if condition worsens.

## 2015-05-10 ENCOUNTER — Ambulatory Visit: Payer: Medicaid Other

## 2015-05-12 ENCOUNTER — Ambulatory Visit: Payer: Medicaid Other

## 2015-05-15 ENCOUNTER — Ambulatory Visit: Payer: Medicaid Other

## 2015-05-15 ENCOUNTER — Encounter: Payer: Self-pay | Admitting: *Deleted

## 2015-05-15 DIAGNOSIS — J4540 Moderate persistent asthma, uncomplicated: Secondary | ICD-10-CM

## 2015-05-15 NOTE — Progress Notes (Signed)
Pulmonary Individual Treatment Plan  Patient Details  Name: [name] MRN: [id number] Date of Birth: 09-14-1970 Referring Provider:  Lamonte Sakai, MD  Initial Encounter Date: 04/29/2015      Pulmonary Rehab from 05/02/2015 in [facility] Cardiac and Pulmonary Rehab   Date  05/02/15      Visit Diagnosis: Asthma, moderate persistent, uncomplicated  [name] Home Medications on Admission:  Current outpatient prescriptions:  .  acetaminophen (TYLENOL) 325 MG tablet, Take 2 tablets (650 mg total) by mouth every 6 (six) hours as needed for mild pain (or Fever >/= 101)., Disp: 1 tablet, Rfl: 0 .  albuterol (PROVENTIL HFA;VENTOLIN HFA) 108 (90 BASE) MCG/ACT inhaler, Inhale 2 puffs into the lungs every 6 (six) hours as needed for wheezing or shortness of breath., Disp: 1 Inhaler, Rfl: 0 .  albuterol (PROVENTIL) (2.5 MG/3ML) 0.083% nebulizer solution, Take 3 mLs (2.5 mg total) by nebulization every 6 (six) hours as needed for wheezing., Disp: 75 mL, Rfl: 12 .  amLODipine (NORVASC) 10 MG tablet, Take 1 tablet (10 mg total) by mouth daily., Disp: 30 tablet, Rfl: 0 .  aspirin EC 81 MG EC tablet, Take 1 tablet (81 mg total) by mouth daily., Disp: 30 tablet, Rfl: 0 .  budesonide (PULMICORT) 0.25 MG/2ML nebulizer solution, Take 2 mLs (0.25 mg total) by nebulization 2 (two) times daily., Disp: 60 mL, Rfl: 12 .  citalopram (CELEXA) 40 MG tablet, Take 40 mg by mouth daily., Disp: , Rfl:  .  clonazePAM (KLONOPIN) 2 MG tablet, Take 2 mg by mouth at bedtime., Disp: , Rfl:  .  diazepam (VALIUM) 5 MG tablet, Take 1 tablet (5 mg total) by mouth every 8 (eight) hours as needed for muscle spasms., Disp: 20 tablet, Rfl: 0 .  hydrALAZINE (APRESOLINE) 100 MG tablet, Take 1 tablet (100 mg total) by mouth every 8 (eight) hours., Disp: 60 tablet, Rfl: 0 .  insulin aspart protamine- aspart (NOVOLOG MIX 70/30) (70-30) 100 UNIT/ML injection, Inject 0.55 mLs (55 Units total) into the skin 2 (two) times daily with a meal. (Patient  taking differently: Inject 50 Units into the skin 2 (two) times daily with a meal. ), Disp: 10 mL, Rfl: 11 .  ipratropium-albuterol (DUONEB) 0.5-2.5 (3) MG/3ML SOLN, Take 3 mLs by nebulization every 6 (six) hours as needed., Disp: 360 mL, Rfl: 2 .  losartan (COZAAR) 50 MG tablet, Take 1 tablet (50 mg total) by mouth daily., Disp: 30 tablet, Rfl: 0 .  Metoprolol Tartrate 75 MG TABS, Take 75 mg by mouth 2 (two) times daily., Disp: 60 tablet, Rfl: 0 .  potassium chloride (K-DUR,KLOR-CON) 10 MEQ tablet, Take 1 tablet (10 mEq total) by mouth 2 (two) times daily., Disp: 30 tablet, Rfl: 0 .  simvastatin (ZOCOR) 40 MG tablet, Take 40 mg by mouth daily., Disp: , Rfl:  .  torsemide (DEMADEX) 20 MG tablet, Take 2 tablets (40 mg total) by mouth 2 (two) times daily as needed., Disp: 120 tablet, Rfl: 6  Past Medical History: Past Medical History  Diagnosis Date  . Poorly controlled diabetes mellitus ([place])     a. 11/2014 A1c 13.2.  . Iron deficiency anemia   . Asthma   . Hypertension   . Pericardial effusion     a. 11/2014 CT Chest: Mod effusion.  . Diabetes mellitus without complication ([place])   . CHF (congestive heart failure) ([facility])     Tobacco Use: History  Smoking status  . Never Smoker   Smokeless tobacco  . Not on  file    Labs: Recent Review Flowsheet Data    Labs for ITP Cardiac and Pulmonary Rehab Latest Ref Rng 12/15/2014 01/03/2015   Cholestrol 0 - 200 mg/dL - 232(H)   LDLCALC 0 - 99 mg/dL - 169(H)   HDL >40 mg/dL - 41   Trlycerides <150 mg/dL - 112   Hemoglobin A1c 4.0 - 6.0 % 13.2(H) -       ADL UCSD:     ADL UCSD      05/02/15 1330       ADL UCSD   ADL Phase Entry     SOB Score total 91     Rest 2     Walk 3     Stairs 5     Bath 4     Dress 3     Shop 3        Pulmonary Function Assessment:     Pulmonary Function Assessment - 05/02/15 1330    Initial Spirometry Results   FVC% 48 %   FEV1% 45 %   FEV1/FVC Ratio 78.27   Post Bronchodilator Spirometry  Results   FVC% 52 %   FEV1% 45 %   FEV1/FVC Ratio 75.3   Breath   Bilateral Breath Sounds Clear   Shortness of Breath Yes;Fear of Shortness of Breath;Limiting activity      Exercise Target Goals:    Exercise Program Goal: Individual exercise prescription set with THRR, safety & activity barriers. Participant demonstrates ability to understand and report RPE using BORG scale, to self-measure pulse accurately, and to acknowledge the importance of the exercise prescription.  Exercise Prescription Goal: Starting with aerobic activity 30 plus minutes a day, 3 days per week for initial exercise prescription. Provide home exercise prescription and guidelines that participant acknowledges understanding prior to discharge.  Activity Barriers & Risk Stratification:     Activity Barriers & Cardiac Risk Stratification - 05/02/15 1330    Activity Barriers & Cardiac Risk Stratification   Activity Barriers Back Problems;Shortness of Breath;Deconditioning;Muscular Weakness   Cardiac Risk Stratification Moderate      6 Minute Walk:     6 Minute Walk      05/02/15 1645       6 Minute Walk   Phase Initial     Distance 555 feet     Walk Time 4 minutes     # of Rest Breaks 3     RPE 15     Perceived Dyspnea  4     Symptoms No     Resting HR 76 bpm     Resting BP 126/76 mmHg     Max Ex. HR 92 bpm     Max Ex. BP 144/78 mmHg        Initial Exercise Prescription:     Initial Exercise Prescription - 05/02/15 1600    Date of Initial Exercise Prescription   Date 05/02/15   Treadmill   MPH 1.5   Grade 0   Minutes 10   Recumbant Bike   Level 2   RPM 40   Watts 20   Minutes 10   NuStep   Level 2   Watts 40   Minutes 10   Arm Ergometer   Level 1   Watts 10   Minutes 10   Recumbant Elliptical   Level 2   RPM 40   Watts 20   Minutes 10   REL-XR   Level 2   Watts 40   Minutes 10  T5 Nustep   Level 1   Watts 15   Minutes 10   Biostep-RELP   Level 2   Watts 40    Minutes 10   Prescription Details   Frequency (times per week) 3   Duration Progress to 30 minutes of continuous aerobic without signs/symptoms of physical distress   Intensity   THRR REST +  30   Ratings of Perceived Exertion 11-15   Perceived Dyspnea 0-4   Progression   Progression Continue progressive overload as per policy without signs/symptoms or physical distress.   Resistance Training   Training Prescription Yes   Weight 1   Reps 10-15      Perform Capillary Blood Glucose checks as needed.  Exercise Prescription Changes:   Exercise Comments:   Discharge Exercise Prescription (Final Exercise Prescription Changes):    Nutrition:  Target Goals: Understanding of nutrition guidelines, daily intake of sodium 1500mg , cholesterol 200mg , calories 30% from fat and 7% or less from saturated fats, daily to have 5 or more servings of fruits and vegetables.  Biometrics:    Nutrition Therapy Plan and Nutrition Goals:     Nutrition Therapy & Goals - 05/02/15 1330    Nutrition Therapy   Diet Ms Oberholtzer is diabetic and has meet with a dietitian about 2 years ago. She would like to meet with them again. Her husband does the cooking and is healthy with low salt. Ms Nano drinks 5-6 hospital cups of water a day.       Nutrition Discharge: Rate Your Plate Scores:   Psychosocial: Target Goals: Acknowledge presence or absence of depression, maximize coping skills, provide positive support system. Participant is able to verbalize types and ability to use techniques and skills needed for reducing stress and depression.  Initial Review & Psychosocial Screening:     Initial Psych Review & Screening - 05/02/15 [place]? Yes   Comments Ms Holbert had good family support from her husband and children. She is anxious to feel better and be more active since her 2 admissions for CHF last October 2016.   Barriers   Psychosocial barriers to  participate in program The patient should benefit from training in stress management and relaxation.   Screening Interventions   Interventions Encouraged to exercise;Program counselor consult      Quality of Life Scores:     Quality of Life - 05/02/15 1330    Quality of Life Scores   Health/Function Pre 10.84 %   Socioeconomic Pre 11.86 %   Psych/Spiritual Pre 13.93 %   Family Pre 16.8 %   GLOBAL Pre 12.51 %      PHQ-9:     Recent Review Flowsheet Data    Depression screen [facility] 2/9 05/02/2015   Decreased Interest 2   Down, Depressed, Hopeless 1   PHQ - 2 Score 3   Altered sleeping 2   Tired, decreased energy 3   Change in appetite 1   Feeling bad or failure about yourself  2   Trouble concentrating 2   Moving slowly or fidgety/restless 1   Suicidal thoughts 1   PHQ-9 Score 15   Difficult doing work/chores Somewhat difficult      Psychosocial Evaluation and Intervention:   Psychosocial Re-Evaluation:  Education: Education Goals: Education classes will be provided on a weekly basis, covering required topics. Participant will state understanding/return demonstration of topics presented.  Learning Barriers/Preferences:     Learning Barriers/Preferences - 05/02/15  1330    Learning Barriers/Preferences   Learning Barriers None   Learning Preferences Group Instruction;Individual Instruction;Pictoral;Skilled Demonstration;Verbal Instruction;Video;Written Material      Education Topics: Initial Evaluation Education: - Verbal, written and demonstration of respiratory meds, RPE/PD scales, oximetry and breathing techniques. Instruction on use of nebulizers and MDIs: cleaning and proper use, rinsing mouth with steroid doses and importance of monitoring MDI activations.          Pulmonary Rehab from 05/02/2015 in [facility] Cardiac and Pulmonary Rehab   Date  05/02/15   Educator  LB   Instruction Review Code  2- meets goals/outcomes      General Nutrition Guidelines/Fats  and Fiber: -Group instruction provided by verbal, written material, models and posters to present the general guidelines for heart healthy nutrition. Gives an explanation and review of dietary fats and fiber.   Controlling Sodium/Reading Food Labels: -Group verbal and written material supporting the discussion of sodium use in heart healthy nutrition. Review and explanation with models, verbal and written materials for utilization of the food label.   Exercise Physiology & Risk Factors: - Group verbal and written instruction with models to review the exercise physiology of the cardiovascular system and associated critical values. Details cardiovascular disease risk factors and the goals associated with each risk factor.   Aerobic Exercise & Resistance Training: - Gives group verbal and written discussion on the health impact of inactivity. On the components of aerobic and resistive training programs and the benefits of this training and how to safely progress through these programs.   Flexibility, Balance, General Exercise Guidelines: - Provides group verbal and written instruction on the benefits of flexibility and balance training programs. Provides general exercise guidelines with specific guidelines to those with heart or lung disease. Demonstration and skill practice provided.   Stress Management: - Provides group verbal and written instruction about the health risks of elevated stress, cause of high stress, and healthy ways to reduce stress.   Depression: - Provides group verbal and written instruction on the correlation between heart/lung disease and depressed mood, treatment options, and the stigmas associated with seeking treatment.   Exercise & Equipment Safety: - Individual verbal instruction and demonstration of equipment use and safety with use of the equipment.   Infection Prevention: - Provides verbal and written material to individual with discussion of infection  control including proper hand washing and proper equipment cleaning during exercise session.   Falls Prevention: - Provides verbal and written material to individual with discussion of falls prevention and safety.      Pulmonary Rehab from 05/02/2015 in [facility] Cardiac and Pulmonary Rehab   Date  05/02/15   Educator  LB   Instruction Review Code  2- meets goals/outcomes      Diabetes: - Individual verbal and written instruction to review signs/symptoms of diabetes, desired ranges of glucose level fasting, after meals and with exercise. Advice that pre and post exercise glucose checks will be done for 3 sessions at entry of program.   Chronic Lung Diseases: - Group verbal and written instruction to review new updates, new respiratory medications, new advancements in procedures and treatments. Provide informative websites and "800" numbers of self-education.   Lung Procedures: - Group verbal and written instruction to describe testing methods done to diagnose lung disease. Review the outcome of test results. Describe the treatment choices: Pulmonary Function Tests, ABGs and oximetry.   Energy Conservation: - Provide group verbal and written instruction for methods to conserve energy, plan and organize activities.  Instruct on pacing techniques, use of adaptive equipment and posture/positioning to relieve shortness of breath.   Triggers: - Group verbal and written instruction to review types of environmental controls: home humidity, furnaces, filters, dust mite/pet prevention, HEPA vacuums. To discuss weather changes, air quality and the benefits of nasal washing.   Exacerbations: - Group verbal and written instruction to provide: warning signs, infection symptoms, calling MD promptly, preventive modes, and value of vaccinations. Review: effective airway clearance, coughing and/or vibration techniques. Create an Sports administrator.   Oxygen: - Individual and group verbal and written instruction  on oxygen therapy. Includes supplement oxygen, available portable oxygen systems, continuous and intermittent flow rates, oxygen safety, concentrators, and Medicare reimbursement for oxygen.   Respiratory Medications: - Group verbal and written instruction to review medications for lung disease. Drug class, frequency, complications, importance of spacers, rinsing mouth after steroid MDI's, and proper cleaning methods for nebulizers.      Pulmonary Rehab from 05/02/2015 in [facility] Cardiac and Pulmonary Rehab   Date  05/02/15   Educator  LB   Instruction Review Code  2- meets goals/outcomes      AED/CPR: - Group verbal and written instruction with the use of models to demonstrate the basic use of the AED with the basic ABC's of resuscitation.   Breathing Retraining: - Provides individuals verbal and written instruction on purpose, frequency, and proper technique of diaphragmatic breathing and pursed-lipped breathing. Applies individual practice skills.      Pulmonary Rehab from 05/02/2015 in [facility] Cardiac and Pulmonary Rehab   Date  05/02/15   Educator  LB   Instruction Review Code  2- meets goals/outcomes      Anatomy and Physiology of the Lungs: - Group verbal and written instruction with the use of models to provide basic lung anatomy and physiology related to function, structure and complications of lung disease.   Heart Failure: - Group verbal and written instruction on the basics of heart failure: signs/symptoms, treatments, explanation of ejection fraction, enlarged heart and cardiomyopathy.   Sleep Apnea: - Individual verbal and written instruction to review Obstructive Sleep Apnea. Review of risk factors, methods for diagnosing and types of masks and machines for OSA.   Anxiety: - Provides group, verbal and written instruction on the correlation between heart/lung disease and anxiety, treatment options, and management of anxiety.   Relaxation: - Provides group, verbal and  written instruction about the benefits of relaxation for patients with heart/lung disease. Also provides patients with examples of relaxation techniques.   Knowledge Questionnaire Score:     Knowledge Questionnaire Score - 05/02/15 1330    Knowledge Questionnaire Score   Pre Score 10/10       Personal Goals and Risk Factors at Admission:     Personal Goals and Risk Factors at Admission - 05/02/15 1330    Core Components/Risk Factors/Patient Goals on Admission    Weight Management Yes;Obesity   Intervention Weight Management: Develop a combined nutrition and exercise program designed to reach desired caloric intake, while maintaining appropriate intake of nutrient and fiber, sodium and fats, and appropriate energy expenditure required for the weight goal.;Weight Management: Provide education and appropriate resources to help participant work on and attain dietary goals.;Weight Management/Obesity: Establish reasonable short term and long term weight goals.;Obesity: Provide education and appropriate resources to help participant work on and attain dietary goals.   Admit Weight 345 lb 3.2 oz (156.582 kg)   Goal Weight: Short Term 300 lb (136.079 kg)   Goal Weight: Long Term  220 lb (99.791 kg)   Expected Outcomes Short Term: Continue to assess and modify interventions until short term weight is achieved.;Long Term: Adherence to nutrition and physical activity/exercise program aimed toward attainment of established weight goal.   Sedentary Yes  Ms Kingsberry likes to walk at the mall.   Intervention Provide advice, education, support and counseling about physical activity/exercise needs.;Develop an individualized exercise prescription for aerobic and resistive training based on initial evaluation findings, risk stratification, comorbidities and participant's personal goals.   Expected Outcomes Achievement of increased cardiorespiratory fitness and enhanced flexibility, muscular endurance and  strength shown through measurements of functional capaciy and personal statement of participant.   Improve shortness of breath with ADL's Yes  Ms Sailors had a high score on her SOB questionnaire and does have fear of shortness of breath.   Intervention Provide education, individualized exercise plan and daily activity instruction to help decrease symptoms of SOB with activities of daily living.   Expected Outcomes Short Term: Achieves a reduction of symptoms when performing activities of daily living.   Develop more efficient breathing techniques such as purse lipped breathing and diaphragmatic breathing; and practicing self-pacing with activity Yes   Intervention Provide education, demonstration and support about specific breathing techniuqes utilized for more efficient breathing. Include techniques such as pursed lipped breathing, diaphragmatic breathing and self-pacing activity.   Expected Outcomes Short Term: Participant will be able to demonstrate and use breathing techniques as needed throughout daily activities.   Increase knowledge of respiratory medications and ability to use respiratory devices properly  Yes   Intervention Provide education and demonstration as needed of appropriate use of medications, inhalers, and oxygen therapy.  Ms Chasey uses Duoneb and Pulmicort SVN; and ProAir MDI; spacer was given with instructions.   Expected Outcomes Short Term: Achieves understanding of medications use. Understands that oxygen is a medication prescribed by physician. Demonstrates appropriate use of inhaler and oxygen therapy.   Diabetes Yes   Intervention Provide education about signs/symptoms and action to take for hypo/hyperglycemia.;Provide education about proper nutrition, including hydration, and aerobic/resistive exercise prescription along with prescribed medications to achieve blood glucose in normal ranges: Fasting glucose 65-99 mg/dL   Expected Outcomes Short Term: Participant  verbalizes understanding of the signs/symptoms and immediate care of hyper/hypoglycemia, proper foot care and importance of medication, aerobic/resistive exercise and nutrition plan for blood glucose control.;Long Term: Attainment of HbA1C < 7%.   Hypertension Yes   Intervention Provide education on lifestyle modifcations including regular physical activity/exercise, weight management, moderate sodium restriction and increased consumption of fresh fruit, vegetables, and low fat dairy, alcohol moderation, and smoking cessation.;Monitor prescription use compliance.   Expected Outcomes Short Term: Continued assessment and intervention until BP is < 140/53mm HG in hypertensive participants. < 130/69mm HG in hypertensive participants with diabetes, heart failure or chronic kidney disease.;Long Term: Maintenance of blood pressure at goal levels.      Personal Goals and Risk Factors Review:    Personal Goals Discharge (Final Personal Goals and Risk Factors Review):    ITP Comments:   Comments: 30 day review.

## 2015-05-16 ENCOUNTER — Telehealth: Payer: Self-pay | Admitting: Cardiovascular Disease

## 2015-05-16 NOTE — Telephone Encounter (Signed)
Received records request Disability Determination Services, forwarded to [employer] for processing.  

## 2015-05-17 ENCOUNTER — Inpatient Hospital Stay: Payer: Medicaid Other | Attending: Oncology

## 2015-05-17 ENCOUNTER — Inpatient Hospital Stay: Payer: Medicaid Other

## 2015-05-17 ENCOUNTER — Inpatient Hospital Stay (HOSPITAL_BASED_OUTPATIENT_CLINIC_OR_DEPARTMENT_OTHER): Payer: Medicaid Other | Admitting: Oncology

## 2015-05-17 ENCOUNTER — Ambulatory Visit: Payer: Medicaid Other

## 2015-05-17 VITALS — BP 181/81 | HR 89 | Resp 18

## 2015-05-17 VITALS — BP 189/83 | HR 96 | Temp 98.6°F | Resp 19 | Ht 64.5 in | Wt 347.0 lb

## 2015-05-17 DIAGNOSIS — I10 Essential (primary) hypertension: Secondary | ICD-10-CM

## 2015-05-17 DIAGNOSIS — Z7982 Long term (current) use of aspirin: Secondary | ICD-10-CM | POA: Diagnosis not present

## 2015-05-17 DIAGNOSIS — D509 Iron deficiency anemia, unspecified: Secondary | ICD-10-CM | POA: Diagnosis not present

## 2015-05-17 DIAGNOSIS — Z79899 Other long term (current) drug therapy: Secondary | ICD-10-CM | POA: Insufficient documentation

## 2015-05-17 DIAGNOSIS — I509 Heart failure, unspecified: Secondary | ICD-10-CM

## 2015-05-17 DIAGNOSIS — J45909 Unspecified asthma, uncomplicated: Secondary | ICD-10-CM | POA: Insufficient documentation

## 2015-05-17 DIAGNOSIS — E1165 Type 2 diabetes mellitus with hyperglycemia: Secondary | ICD-10-CM | POA: Insufficient documentation

## 2015-05-17 DIAGNOSIS — R531 Weakness: Secondary | ICD-10-CM | POA: Diagnosis not present

## 2015-05-17 DIAGNOSIS — R5383 Other fatigue: Secondary | ICD-10-CM | POA: Insufficient documentation

## 2015-05-17 DIAGNOSIS — Z794 Long term (current) use of insulin: Secondary | ICD-10-CM

## 2015-05-17 LAB — CBC WITH DIFFERENTIAL/PLATELET
BASOS PCT: 2 %
Basophils Absolute: 0.1 10*3/uL (ref 0–0.1)
Eosinophils Absolute: 0.2 10*3/uL (ref 0–0.7)
Eosinophils Relative: 3 %
HEMATOCRIT: 33.3 % — AB (ref 35.0–47.0)
HEMOGLOBIN: 10.3 g/dL — AB (ref 12.0–16.0)
LYMPHS ABS: 1.3 10*3/uL (ref 1.0–3.6)
LYMPHS PCT: 20 %
MCH: 21.5 pg — ABNORMAL LOW (ref 26.0–34.0)
MCHC: 30.9 g/dL — AB (ref 32.0–36.0)
MCV: 69.5 fL — ABNORMAL LOW (ref 80.0–100.0)
MONOS PCT: 7 %
Monocytes Absolute: 0.5 10*3/uL (ref 0.2–0.9)
NEUTROS ABS: 4.5 10*3/uL (ref 1.4–6.5)
NEUTROS PCT: 68 %
Platelets: 357 10*3/uL (ref 150–440)
RBC: 4.79 MIL/uL (ref 3.80–5.20)
RDW: 20.8 % — ABNORMAL HIGH (ref 11.5–14.5)
WBC: 6.6 10*3/uL (ref 3.6–11.0)

## 2015-05-17 LAB — IRON AND TIBC
Iron: 22 ug/dL — ABNORMAL LOW (ref 28–170)
Saturation Ratios: 7 % — ABNORMAL LOW (ref 10.4–31.8)
TIBC: 315 ug/dL (ref 250–450)
UIBC: 293 ug/dL

## 2015-05-17 LAB — FERRITIN: Ferritin: 6 ng/mL — ABNORMAL LOW (ref 11–307)

## 2015-05-17 MED ORDER — SODIUM CHLORIDE 0.9 % IV SOLN: 50.0000 mg | Freq: Once | INTRAVENOUS | Status: DC

## 2015-05-17 MED ORDER — FAMOTIDINE IN NACL 20-0.9 MG/50ML-% IV SOLN
20.0000 mg | Freq: Two times a day (BID) | INTRAVENOUS | Status: DC
  Filled 2015-05-17: qty 50

## 2015-05-17 MED ORDER — SODIUM CHLORIDE 0.9 % IV SOLN
510.0000 mg | Freq: Once | INTRAVENOUS | Status: AC
  Administered 2015-05-17: 510 mg via INTRAVENOUS
  Filled 2015-05-17: qty 17

## 2015-05-17 MED ORDER — FAMOTIDINE IN NACL 20-0.9 MG/50ML-% IV SOLN
20.0000 mg | Freq: Once | INTRAVENOUS | Status: AC
  Administered 2015-05-17: 20 mg via INTRAVENOUS

## 2015-05-17 MED ORDER — SODIUM CHLORIDE 0.9 % IV SOLN
20.0000 mg | Freq: Once | INTRAVENOUS | Status: AC
  Administered 2015-05-17: 20 mg via INTRAVENOUS
  Filled 2015-05-17: qty 2

## 2015-05-17 MED ORDER — DIPHENHYDRAMINE HCL 50 MG/ML IJ SOLN
25.0000 mg | Freq: Once | INTRAMUSCULAR | Status: AC
  Administered 2015-05-17: 25 mg via INTRAVENOUS
  Filled 2015-05-17: qty 1

## 2015-05-17 MED ORDER — ACETAMINOPHEN 325 MG PO TABS
650.0000 mg | ORAL_TABLET | Freq: Four times a day (QID) | ORAL | Status: DC | PRN
  Administered 2015-05-17: 650 mg via ORAL
  Filled 2015-05-17: qty 2

## 2015-05-17 MED ORDER — SODIUM CHLORIDE 0.9 % IV SOLN
Freq: Once | INTRAVENOUS | Status: AC
  Administered 2015-05-17: 16:00:00 via INTRAVENOUS
  Filled 2015-05-17: qty 1000

## 2015-05-17 NOTE — Progress Notes (Signed)
Sever Fatigue per pt and went to eye doctor yesterday and reports vision is worse

## 2015-05-17 NOTE — Progress Notes (Signed)
Patient is itching after feraheme treatment.  Dr. Grayland Ormond made aware.  Patient states she usually does this after treatment.  Patient advised per Dr. Grayland Ormond to take benadryl when she gets home.

## 2015-05-18 ENCOUNTER — Ambulatory Visit
Admission: RE | Admit: 2015-05-18 | Discharge: 2015-05-18 | Disposition: A | Discharge status: Home / Self Care | Payer: Medicaid Other | Source: Ambulatory Visit | Attending: Nurse Practitioner | Admitting: Nurse Practitioner

## 2015-05-18 ENCOUNTER — Other Ambulatory Visit: Payer: Self-pay | Admitting: Nurse Practitioner

## 2015-05-18 DIAGNOSIS — M545 Low back pain: Secondary | ICD-10-CM

## 2015-05-18 DIAGNOSIS — M5430 Sciatica, unspecified side: Secondary | ICD-10-CM

## 2015-05-18 DIAGNOSIS — M5441 Lumbago with sciatica, right side: Secondary | ICD-10-CM | POA: Insufficient documentation

## 2015-05-18 IMAGING — CR DG LUMBAR SPINE COMPLETE 4+V
4 series · 4 of 4 positions shown · non-contrast
Comparison: CT abdomen pelvis - [DATE]

CLINICAL DATA: Intermittent non radiating right-sided back pain for
the past year. No known injury.

EXAM:
LUMBAR SPINE - COMPLETE 4+ VIEW

[l-spine ap]
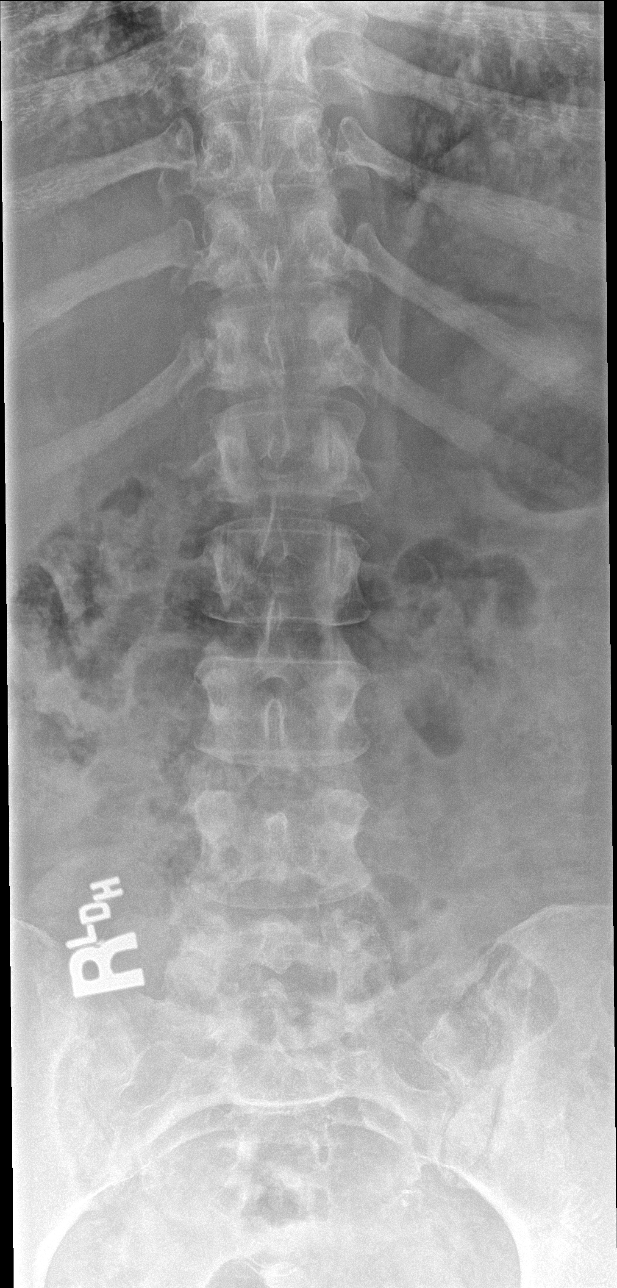

[l-spine lat (1 of 2)]
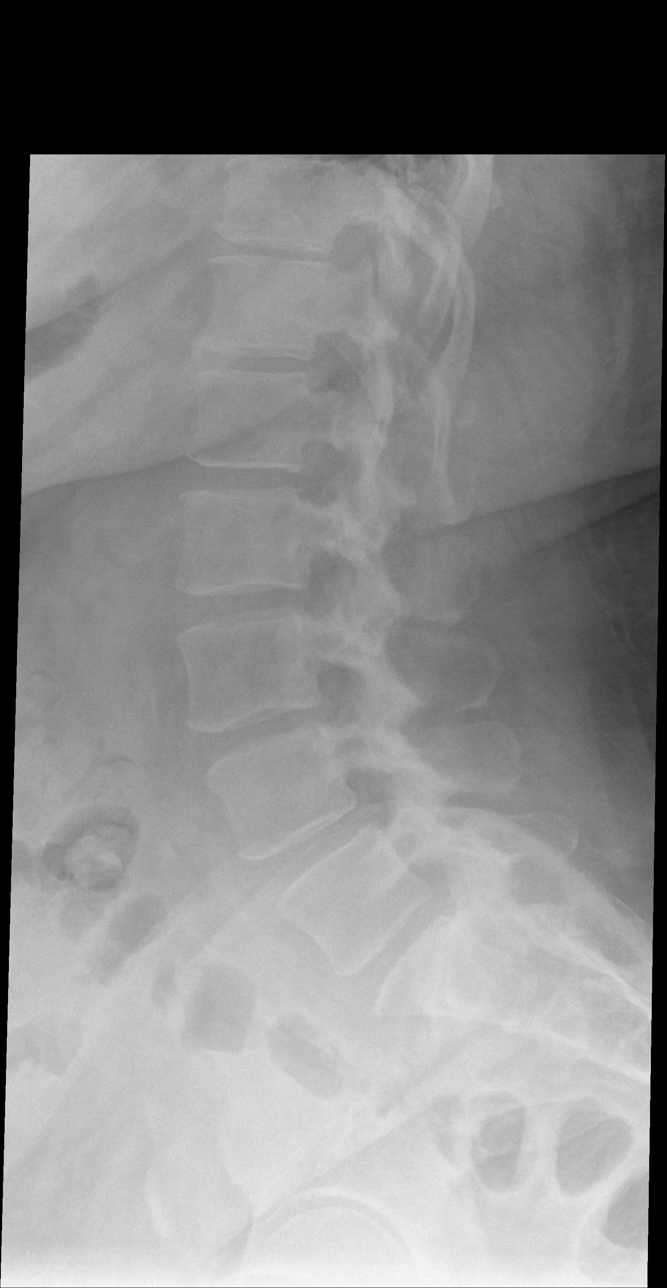

[l-spine spot]
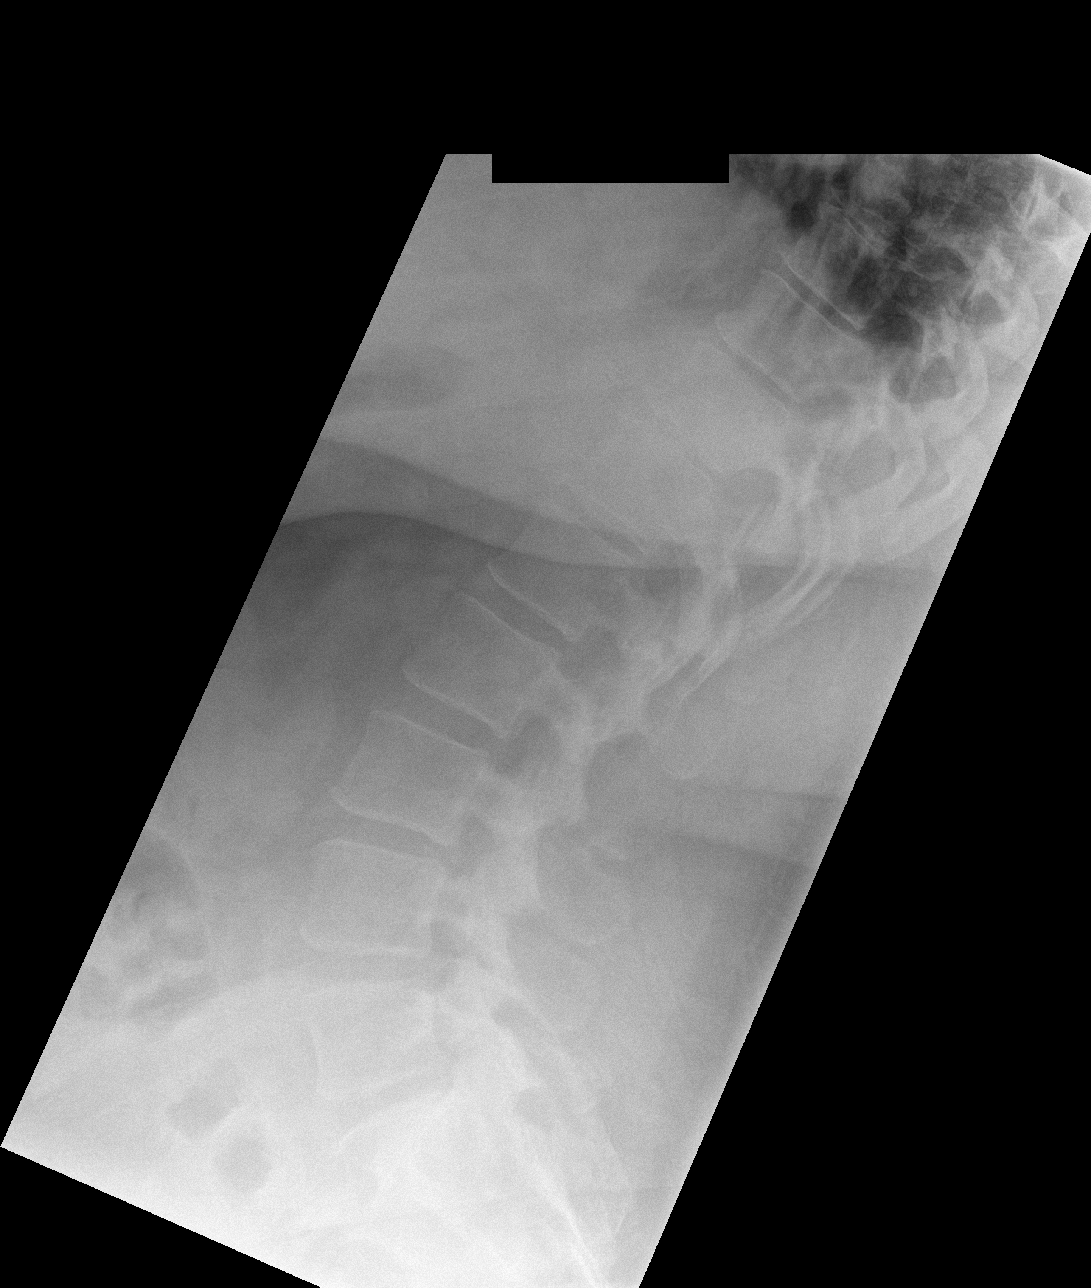

[l-spine lat (2 of 2)]
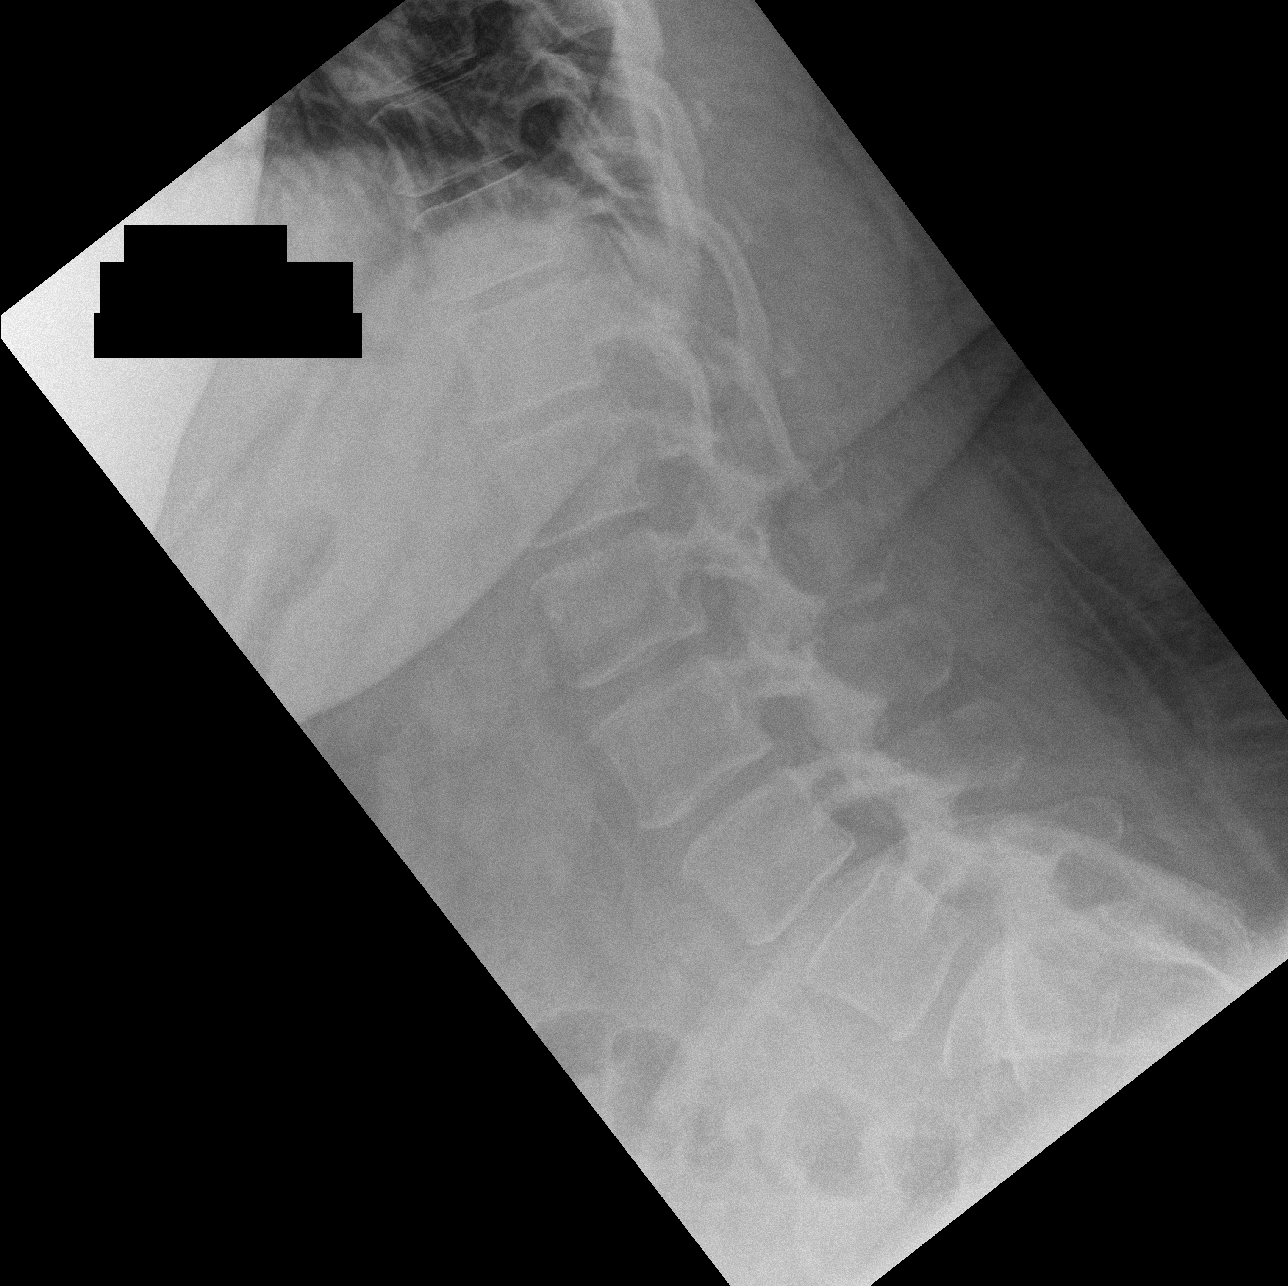

[4 of 4 positions shown; findings below may reference images not displayed]

FINDINGS: There are 5 non rib-bearing lumbar type vertebral bodies.

Normal alignment of the lumbar spine. No anterolisthesis or
retrolisthesis. Normal alignment of the lumbar spine is preserved
given the acquired degrees of flexion and extension. Lumbar
vertebral body heights are preserved. Intervertebral disc space
heights are preserved.

Limited visualization the bilateral SI joints is normal.

Moderate colonic stool burden without evidence of enteric
obstruction.
IMPRESSION: No explanation for patient's intermittent right-sided back pain.

## 2015-05-19 ENCOUNTER — Ambulatory Visit: Payer: Medicaid Other

## 2015-05-22 ENCOUNTER — Ambulatory Visit: Payer: Medicaid Other

## 2015-05-24 ENCOUNTER — Ambulatory Visit: Payer: Medicaid Other

## 2015-05-24 ENCOUNTER — Inpatient Hospital Stay: Payer: Medicaid Other

## 2015-05-24 VITALS — BP 157/86 | HR 79 | Temp 98.4°F | Resp 20

## 2015-05-24 DIAGNOSIS — D509 Iron deficiency anemia, unspecified: Secondary | ICD-10-CM | POA: Diagnosis not present

## 2015-05-24 MED ORDER — DIPHENHYDRAMINE HCL 50 MG/ML IJ SOLN
25.0000 mg | Freq: Once | INTRAMUSCULAR | Status: AC
  Administered 2015-05-24: 25 mg via INTRAVENOUS
  Filled 2015-05-24: qty 1

## 2015-05-24 MED ORDER — SODIUM CHLORIDE 0.9 % IV SOLN: 50.0000 mg | Freq: Once | INTRAVENOUS | Status: DC

## 2015-05-24 MED ORDER — SODIUM CHLORIDE 0.9 % IV SOLN
510.0000 mg | Freq: Once | INTRAVENOUS | Status: AC
  Administered 2015-05-24: 510 mg via INTRAVENOUS
  Filled 2015-05-24: qty 17

## 2015-05-24 MED ORDER — FAMOTIDINE IN NACL 20-0.9 MG/50ML-% IV SOLN
20.0000 mg | Freq: Once | INTRAVENOUS | Status: AC
  Administered 2015-05-24: 20 mg via INTRAVENOUS
  Filled 2015-05-24: qty 50

## 2015-05-24 MED ORDER — ACETAMINOPHEN 325 MG PO TABS
650.0000 mg | ORAL_TABLET | Freq: Four times a day (QID) | ORAL | Status: DC | PRN
  Administered 2015-05-24: 650 mg via ORAL
  Filled 2015-05-24: qty 2

## 2015-05-24 MED ORDER — SODIUM CHLORIDE 0.9 % IV SOLN
Freq: Once | INTRAVENOUS | Status: AC
  Administered 2015-05-24: 14:00:00 via INTRAVENOUS
  Filled 2015-05-24: qty 1000

## 2015-05-24 MED ORDER — SODIUM CHLORIDE 0.9 % IV SOLN
20.0000 mg | Freq: Once | INTRAVENOUS | Status: AC
  Administered 2015-05-24: 20 mg via INTRAVENOUS
  Filled 2015-05-24: qty 2

## 2015-05-26 ENCOUNTER — Ambulatory Visit: Payer: Medicaid Other

## 2015-05-29 ENCOUNTER — Telehealth: Payer: Self-pay

## 2015-05-29 ENCOUNTER — Ambulatory Visit: Payer: Medicaid Other

## 2015-05-29 NOTE — Telephone Encounter (Signed)
Plans to start on April 10th.

## 2015-05-29 NOTE — Progress Notes (Signed)
[place]  [phone] [phone]  ID: [name] OB: Nov 29, 1970  MR#: [id number]  [id number]  Patient Care Team: Kasandra Knudsen, NP as PCP - General (Nurse Practitioner) Minna Merritts, MD as Consulting Physician (Cardiology)  CHIEF COMPLAINT:  Chief Complaint  Patient presents with  . Follow-up    IDA    INTERVAL HISTORY: Patient Returns to clinic today for repeat laboratory work and further evaluation. She continues to have persistent weakness and fatigue. She denies any recent fevers or illnesses. She has no neurologic complaints. She has no chest pain or shortness of breath. She denies any nausea, vomiting, consultation, or diarrhea. She has no melena or hematochezia. Patient otherwise feels well and offers no further specific complaints.  REVIEW OF SYSTEMS:   Review of Systems  Constitutional: Positive for malaise/fatigue. Negative for fever and weight loss.  Respiratory: Negative.  Negative for sputum production.   Cardiovascular: Negative.  Negative for chest pain.  Gastrointestinal: Negative.  Negative for blood in stool and melena.  Musculoskeletal: Negative.   Neurological: Positive for weakness.    As per HPI. Otherwise, a complete review of systems is negatve.  PAST MEDICAL HISTORY: Past Medical History  Diagnosis Date  . Poorly controlled diabetes mellitus ([place])     a. 11/2014 A1c 13.2.  . Iron deficiency anemia   . Asthma   . Hypertension   . Pericardial effusion     a. 11/2014 CT Chest: Mod effusion.  . Diabetes mellitus without complication ([place])   . CHF (congestive heart failure) ([place])     PAST SURGICAL HISTORY: Past Surgical History  Procedure Laterality Date  . Cesarean section      FAMILY HISTORY Family History  Problem Relation Age of Onset  . Hypertension    . Diabetes    . Diabetes Mother   . Hypertension Mother   . Hypertension Father   . Diabetes Father        ADVANCED  DIRECTIVES:    HEALTH MAINTENANCE: Social History  Substance Use Topics  . Smoking status: Never Smoker   . Smokeless tobacco: Not on file  . Alcohol Use: Yes     Colonoscopy:  PAP:  Bone density:  Lipid panel:  Allergies  Allergen Reactions  . Feraheme [Ferumoxytol] Itching    Given pre meds before infusions  . Sulfa Antibiotics Itching    Current Outpatient Prescriptions  Medication Sig Dispense Refill  . acetaminophen (TYLENOL) 325 MG tablet Take 2 tablets (650 mg total) by mouth every 6 (six) hours as needed for mild pain (or Fever >/= 101). 1 tablet 0  . albuterol (PROVENTIL HFA;VENTOLIN HFA) 108 (90 BASE) MCG/ACT inhaler Inhale 2 puffs into the lungs every 6 (six) hours as needed for wheezing or shortness of breath. 1 Inhaler 0  . amLODipine (NORVASC) 10 MG tablet Take 1 tablet (10 mg total) by mouth daily. 30 tablet 0  . aspirin EC 81 MG EC tablet Take 1 tablet (81 mg total) by mouth daily. 30 tablet 0  . budesonide (PULMICORT) 0.25 MG/2ML nebulizer solution Take 2 mLs (0.25 mg total) by nebulization 2 (two) times daily. 60 mL 12  . citalopram (CELEXA) 40 MG tablet Take 40 mg by mouth daily.    . clonazePAM (KLONOPIN) 2 MG tablet Take 2 mg by mouth at bedtime.    . diazepam (VALIUM) 5 MG tablet Take 1 tablet (5 mg total) by mouth every 8 (eight) hours as needed for muscle spasms. 20 tablet 0  .  hydrALAZINE (APRESOLINE) 100 MG tablet Take 1 tablet (100 mg total) by mouth every 8 (eight) hours. 60 tablet 0  . insulin aspart protamine- aspart (NOVOLOG MIX 70/30) (70-30) 100 UNIT/ML injection Inject 0.55 mLs (55 Units total) into the skin 2 (two) times daily with a meal. (Patient taking differently: Inject 50 Units into the skin 2 (two) times daily with a meal. ) 10 mL 11  . ipratropium-albuterol (DUONEB) 0.5-2.5 (3) MG/3ML SOLN Take 3 mLs by nebulization every 6 (six) hours as needed. 360 mL 2  . losartan (COZAAR) 50 MG tablet Take 1 tablet (50 mg total) by mouth daily. 30  tablet 0  . Metoprolol Tartrate 75 MG TABS Take 75 mg by mouth 2 (two) times daily. 60 tablet 0  . potassium chloride (K-DUR,KLOR-CON) 10 MEQ tablet Take 1 tablet (10 mEq total) by mouth 2 (two) times daily. 30 tablet 0  . simvastatin (ZOCOR) 40 MG tablet Take 40 mg by mouth daily.    [name] torsemide (DEMADEX) 20 MG tablet Take 2 tablets (40 mg total) by mouth 2 (two) times daily as needed. 120 tablet 6   No current facility-administered medications for this visit.    OBJECTIVE: Filed Vitals:   05/17/15 1458  BP: 189/83  Pulse: 96  Temp: 98.6 F (37 C)  Resp: 19     Body mass index is 58.66 kg/(m^2).    ECOG FS:1 - Symptomatic but completely ambulatory  General: Well-developed, well-nourished, no acute distress. Eyes: Pink conjunctiva, anicteric sclera. Lungs: Clear to auscultation bilaterally. Heart: Regular rate and rhythm. No rubs, murmurs, or gallops. Abdomen: Soft, nontender, nondistended. No organomegaly noted, normoactive bowel sounds. Musculoskeletal: No edema, cyanosis, or clubbing. Neuro: Alert, answering all questions appropriately. Cranial nerves grossly intact. Skin: No rashes or petechiae noted. Psych: Normal affect.   LAB RESULTS:  Lab Results  Component Value Date   NA 137 01/03/2015   K 3.9 01/03/2015   CL 100* 01/03/2015   CO2 32 01/03/2015   GLUCOSE 62* 01/03/2015   BUN 24* 01/03/2015   CREATININE 1.03* 01/03/2015   CALCIUM 8.0* 01/03/2015   PROT 6.2* 12/31/2014   ALBUMIN 2.7* 12/31/2014   AST 16 12/31/2014   ALT 17 12/31/2014   ALKPHOS 88 12/31/2014   BILITOT 0.2* 12/31/2014   GFRNONAA >60 01/03/2015   GFRAA >60 01/03/2015    Lab Results  Component Value Date   WBC 6.6 05/17/2015   NEUTROABS 4.5 05/17/2015   HGB 10.3* 05/17/2015   HCT 33.3* 05/17/2015   MCV 69.5* 05/17/2015   PLT 357 05/17/2015   Lab Results  Component Value Date   IRON 22* 05/17/2015   TIBC 315 05/17/2015   IRONPCTSAT 7* 05/17/2015    Lab Results  Component Value  Date   FERRITIN 6* 05/17/2015     STUDIES: Dg Lumbar Spine Complete  05/19/2015  CLINICAL DATA:  Intermittent non radiating right-sided back pain for the past year. No known injury. EXAM: LUMBAR SPINE - COMPLETE 4+ VIEW COMPARISON:  CT abdomen pelvis - 11/17/2014 FINDINGS: There are 5 non rib-bearing lumbar type vertebral bodies. Normal alignment of the lumbar spine. No anterolisthesis or retrolisthesis. Normal alignment of the lumbar spine is preserved given the acquired degrees of flexion and extension. Lumbar vertebral body heights are preserved. Intervertebral disc space heights are preserved. Limited visualization the bilateral SI joints is normal. Moderate colonic stool burden without evidence of enteric obstruction. IMPRESSION: No explanation for patient's intermittent right-sided back pain. Electronically Signed   By: Sandi Mariscal  M.D.   On: 05/19/2015 07:53    ASSESSMENT: Iron deficiency anemia.  PLAN:    1. Iron deficiency anemia: Likely secondary to heavy menses. [name] hemoglobin and iron stores are decreased therefore she will benefit from 510 mg IV Feraheme. Previously, patient had a mild allergic reaction to Feraheme therefore in addition to her treatment she will receive 20 mg of dexamethasone, 25 mg Benadryl, 50 mg ranitidine, and 650 mg of Tylenol. Return to clinic in 1 week for a second infusion.  Patient will then return to clinic in 3 months for repeat laboratory work and further evaluation. 2. Thrombocytosis: Resolved.  Likely secondary to iron deficiency anemia. Monitor. 3. Leukocytosis: Resolved.  Patient expressed understanding and was in agreement with this plan. She also understands that She can call clinic at any time with any questions, concerns, or complaints.    Lloyd Huger, MD   05/29/2015 5:05 PM

## 2015-05-31 ENCOUNTER — Ambulatory Visit: Payer: Medicaid Other

## 2015-06-02 ENCOUNTER — Ambulatory Visit: Payer: Medicaid Other

## 2015-06-05 ENCOUNTER — Ambulatory Visit: Payer: Medicaid Other

## 2015-06-07 ENCOUNTER — Ambulatory Visit: Payer: Medicaid Other

## 2015-06-09 ENCOUNTER — Ambulatory Visit: Payer: Medicaid Other

## 2015-06-12 ENCOUNTER — Ambulatory Visit: Payer: Medicaid Other

## 2015-06-13 NOTE — Progress Notes (Signed)
Pulmonary Individual Treatment Plan  Patient Details  Name: [name] MRN: [id number] Date of Birth: 03/26/1970 Referring Provider:  Dr Lamonte Sakai  Initial Encounter Date: 05/02/2015      Pulmonary Rehab from 05/02/2015 in [facility] Cardiac and Pulmonary Rehab   Date  05/02/15      Visit Diagnosis: Asthma, moderate persistent, uncomplicated - Plan: PULMONARY REHAB 30 DAY REVIEW  Chronic diastolic congestive heart failure ([place]) - Plan: PULMONARY REHAB 30 DAY REVIEW  Morbid obesity, unspecified obesity type ([place]) - Plan: PULMONARY REHAB 30 DAY REVIEW  [name] Home Medications on Admission:  Current outpatient prescriptions:    acetaminophen (TYLENOL) 325 MG tablet, Take 2 tablets (650 mg total) by mouth every 6 (six) hours as needed for mild pain (or Fever >/= 101)., Disp: 1 tablet, Rfl: 0   albuterol (PROVENTIL HFA;VENTOLIN HFA) 108 (90 BASE) MCG/ACT inhaler, Inhale 2 puffs into the lungs every 6 (six) hours as needed for wheezing or shortness of breath., Disp: 1 Inhaler, Rfl: 0   amLODipine (NORVASC) 10 MG tablet, Take 1 tablet (10 mg total) by mouth daily., Disp: 30 tablet, Rfl: 0   aspirin EC 81 MG EC tablet, Take 1 tablet (81 mg total) by mouth daily., Disp: 30 tablet, Rfl: 0   budesonide (PULMICORT) 0.25 MG/2ML nebulizer solution, Take 2 mLs (0.25 mg total) by nebulization 2 (two) times daily., Disp: 60 mL, Rfl: 12   citalopram (CELEXA) 40 MG tablet, Take 40 mg by mouth daily., Disp: , Rfl:    clonazePAM (KLONOPIN) 2 MG tablet, Take 2 mg by mouth at bedtime., Disp: , Rfl:    diazepam (VALIUM) 5 MG tablet, Take 1 tablet (5 mg total) by mouth every 8 (eight) hours as needed for muscle spasms., Disp: 20 tablet, Rfl: 0   hydrALAZINE (APRESOLINE) 100 MG tablet, Take 1 tablet (100 mg total) by mouth every 8 (eight) hours., Disp: 60 tablet, Rfl: 0   insulin aspart protamine- aspart (NOVOLOG MIX 70/30) (70-30) 100 UNIT/ML injection, Inject 0.55 mLs (55 Units total) into the skin 2  (two) times daily with a meal. (Patient taking differently: Inject 50 Units into the skin 2 (two) times daily with a meal. ), Disp: 10 mL, Rfl: 11   ipratropium-albuterol (DUONEB) 0.5-2.5 (3) MG/3ML SOLN, Take 3 mLs by nebulization every 6 (six) hours as needed., Disp: 360 mL, Rfl: 2   losartan (COZAAR) 50 MG tablet, Take 1 tablet (50 mg total) by mouth daily., Disp: 30 tablet, Rfl: 0   Metoprolol Tartrate 75 MG TABS, Take 75 mg by mouth 2 (two) times daily., Disp: 60 tablet, Rfl: 0   potassium chloride (K-DUR,KLOR-CON) 10 MEQ tablet, Take 1 tablet (10 mEq total) by mouth 2 (two) times daily., Disp: 30 tablet, Rfl: 0   simvastatin (ZOCOR) 40 MG tablet, Take 40 mg by mouth daily., Disp: , Rfl:    torsemide (DEMADEX) 20 MG tablet, Take 2 tablets (40 mg total) by mouth 2 (two) times daily as needed., Disp: 120 tablet, Rfl: 6  Past Medical History: Past Medical History  Diagnosis Date   Poorly controlled diabetes mellitus ([place])     a. 11/2014 A1c 13.2.   Iron deficiency anemia    Asthma    Hypertension    Pericardial effusion     a. 11/2014 CT Chest: Mod effusion.   Diabetes mellitus without complication (H[facility])    CHF (congestive heart failure) (H[facility])     Tobacco Use: History  Smoking status   Never Smoker   Smokeless  tobacco   Not on file    Labs: Recent Review Flowsheet Data    Labs for ITP Cardiac and Pulmonary Rehab Latest Ref Rng 12/15/2014 01/03/2015   Cholestrol 0 - 200 mg/dL - 232(H)   LDLCALC 0 - 99 mg/dL - 169(H)   HDL >40 mg/dL - 41   Trlycerides <150 mg/dL - 112   Hemoglobin A1c 4.0 - 6.0 % 13.2(H) -       ADL [facility]:     Pulmonary Assessment Scores      05/02/15 1330       ADL [facility]SD   ADL Phase Entry     SOB Score total 91     Rest 2     Walk 3     Stairs 5     Bath 4     Dress 3     Shop 3        Pulmonary Function Assessment:     Pulmonary Function Assessment - 05/02/15 1330    Initial Spirometry Results   FVC% 48 %   FEV1% 45 %    FEV1/FVC Ratio 78.27   Post Bronchodilator Spirometry Results   FVC% 52 %   FEV1% 45 %   FEV1/FVC Ratio 75.3   Breath   Bilateral Breath Sounds Clear   Shortness of Breath Yes;Fear of Shortness of Breath;Limiting activity      Exercise Target Goals: Date: 05/02/15  Exercise Program Goal: Individual exercise prescription set with THRR, safety & activity barriers. Participant demonstrates ability to understand and report RPE using BORG scale, to self-measure pulse accurately, and to acknowledge the importance of the exercise prescription.  Exercise Prescription Goal: Starting with aerobic activity 30 plus minutes a day, 3 days per week for initial exercise prescription. Provide home exercise prescription and guidelines that participant acknowledges understanding prior to discharge.  Activity Barriers & Risk Stratification:     Activity Barriers & Cardiac Risk Stratification - 05/02/15 1330    Activity Barriers & Cardiac Risk Stratification   Activity Barriers Back Problems;Shortness of Breath;Deconditioning;Muscular Weakness   Cardiac Risk Stratification Moderate      6 Minute Walk:     6 Minute Walk      05/02/15 1645       6 Minute Walk   Phase Initial     Distance 555 feet     Walk Time 4 minutes     # of Rest Breaks 3     RPE 15     Perceived Dyspnea  4     Symptoms No     Resting HR 76 bpm     Resting BP 126/76 mmHg     Max Ex. HR 92 bpm     Max Ex. BP 144/78 mmHg        Initial Exercise Prescription:     Initial Exercise Prescription - 05/02/15 1600    Date of Initial Exercise RX and Referring Provider   Date 05/02/15   Treadmill   MPH 1.5   Grade 0   Minutes 10   Recumbant Bike   Level 2   RPM 40   Watts 20   Minutes 10   NuStep   Level 2   Watts 40   Minutes 10   Arm Ergometer   Level 1   Watts 10   Minutes 10   Recumbant Elliptical   Level 2   RPM 40   Watts 20   Minutes 10   REL-XR   Level 2  Watts 40   Minutes 10   T5  Nustep   Level 1   Watts 15   Minutes 10   Biostep-RELP   Level 2   Watts 40   Minutes 10   Prescription Details   Frequency (times per week) 3   Duration Progress to 30 minutes of continuous aerobic without signs/symptoms of physical distress   Intensity   THRR REST +  30   Ratings of Perceived Exertion 11-15   Perceived Dyspnea 0-4   Progression   Progression Continue progressive overload as per policy without signs/symptoms or physical distress.   Resistance Training   Training Prescription Yes   Weight 1   Reps 10-15      Perform Capillary Blood Glucose checks as needed.  Exercise Prescription Changes:   Exercise Comments:   Discharge Exercise Prescription (Final Exercise Prescription Changes):    Nutrition:  Target Goals: Understanding of nutrition guidelines, daily intake of sodium 1500mg , cholesterol 200mg , calories 30% from fat and 7% or less from saturated fats, daily to have 5 or more servings of fruits and vegetables.  Biometrics:    Nutrition Therapy Plan and Nutrition Goals:     Nutrition Therapy & Goals - 05/02/15 1330    Nutrition Therapy   Diet Ms Lieb is diabetic and has meet with a dietitian about 2 years ago. She would like to meet with them again. Her husband does the cooking and is healthy with low salt. Ms Granger drinks 5-6 hospital cups of water a day.       Nutrition Discharge: Rate Your Plate Scores:   Psychosocial: Target Goals: Acknowledge presence or absence of depression, maximize coping skills, provide positive support system. Participant is able to verbalize types and ability to use techniques and skills needed for reducing stress and depression.  Initial Review & Psychosocial Screening:     Initial Psych Review & Screening - 05/02/15 [place]? Yes   Comments Ms Graybill had good family support from her husband and children. She is anxious to feel better and be more active since  her 2 admissions for CHF last October 2016.   Barriers   Psychosocial barriers to participate in program The patient should benefit from training in stress management and relaxation.   Screening Interventions   Interventions Encouraged to exercise;Program counselor consult      Quality of Life Scores:     Quality of Life - 05/02/15 1330    Quality of Life Scores   Health/Function Pre 10.84 %   Socioeconomic Pre 11.86 %   Psych/Spiritual Pre 13.93 %   Family Pre 16.8 %   GLOBAL Pre 12.51 %      PHQ-9:     Recent Review Flowsheet Data    Depression screen [facility] 2/9 05/02/2015   Decreased Interest 2   Down, Depressed, Hopeless 1   PHQ - 2 Score 3   Altered sleeping 2   Tired, decreased energy 3   Change in appetite 1   Feeling bad or failure about yourself  2   Trouble concentrating 2   Moving slowly or fidgety/restless 1   Suicidal thoughts 1   PHQ-9 Score 15   Difficult doing work/chores Somewhat difficult      Psychosocial Evaluation and Intervention:   Psychosocial Re-Evaluation:  Education: Education Goals: Education classes will be provided on a weekly basis, covering required topics. Participant will state understanding/return demonstration of topics presented.  Learning Barriers/Preferences:  Learning Barriers/Preferences - 05/02/15 1330    Learning Barriers/Preferences   Learning Barriers None   Learning Preferences Group Instruction;Individual Instruction;Pictoral;Skilled Demonstration;Verbal Instruction;Video;Written Material      Education Topics: Initial Evaluation Education: - Verbal, written and demonstration of respiratory meds, RPE/PD scales, oximetry and breathing techniques. Instruction on use of nebulizers and MDIs: cleaning and proper use, rinsing mouth with steroid doses and importance of monitoring MDI activations.          Pulmonary Rehab from 05/02/2015 in [facility] Cardiac and Pulmonary Rehab   Date  05/02/15   Educator  LB    Instruction Review Code  2- meets goals/outcomes      General Nutrition Guidelines/Fats and Fiber: -Group instruction provided by verbal, written material, models and posters to present the general guidelines for heart healthy nutrition. Gives an explanation and review of dietary fats and fiber.   Controlling Sodium/Reading Food Labels: -Group verbal and written material supporting the discussion of sodium use in heart healthy nutrition. Review and explanation with models, verbal and written materials for utilization of the food label.   Exercise Physiology & Risk Factors: - Group verbal and written instruction with models to review the exercise physiology of the cardiovascular system and associated critical values. Details cardiovascular disease risk factors and the goals associated with each risk factor.   Aerobic Exercise & Resistance Training: - Gives group verbal and written discussion on the health impact of inactivity. On the components of aerobic and resistive training programs and the benefits of this training and how to safely progress through these programs.   Flexibility, Balance, General Exercise Guidelines: - Provides group verbal and written instruction on the benefits of flexibility and balance training programs. Provides general exercise guidelines with specific guidelines to those with heart or lung disease. Demonstration and skill practice provided.   Stress Management: - Provides group verbal and written instruction about the health risks of elevated stress, cause of high stress, and healthy ways to reduce stress.   Depression: - Provides group verbal and written instruction on the correlation between heart/lung disease and depressed mood, treatment options, and the stigmas associated with seeking treatment.   Exercise & Equipment Safety: - Individual verbal instruction and demonstration of equipment use and safety with use of the equipment.   Infection  Prevention: - Provides verbal and written material to individual with discussion of infection control including proper hand washing and proper equipment cleaning during exercise session.   Falls Prevention: - Provides verbal and written material to individual with discussion of falls prevention and safety.      Pulmonary Rehab from 05/02/2015 in [facility] Cardiac and Pulmonary Rehab   Date  05/02/15   Educator  LB   Instruction Review Code  2- meets goals/outcomes      Diabetes: - Individual verbal and written instruction to review signs/symptoms of diabetes, desired ranges of glucose level fasting, after meals and with exercise. Advice that pre and post exercise glucose checks will be done for 3 sessions at entry of program.   Chronic Lung Diseases: - Group verbal and written instruction to review new updates, new respiratory medications, new advancements in procedures and treatments. Provide informative websites and "800" numbers of self-education.   Lung Procedures: - Group verbal and written instruction to describe testing methods done to diagnose lung disease. Review the outcome of test results. Describe the treatment choices: Pulmonary Function Tests, ABGs and oximetry.   Energy Conservation: - Provide group verbal and written instruction for methods to conserve energy,  plan and organize activities. Instruct on pacing techniques, use of adaptive equipment and posture/positioning to relieve shortness of breath.   Triggers: - Group verbal and written instruction to review types of environmental controls: home humidity, furnaces, filters, dust mite/pet prevention, HEPA vacuums. To discuss weather changes, air quality and the benefits of nasal washing.   Exacerbations: - Group verbal and written instruction to provide: warning signs, infection symptoms, calling MD promptly, preventive modes, and value of vaccinations. Review: effective airway clearance, coughing and/or vibration  techniques. Create an Sports administrator.   Oxygen: - Individual and group verbal and written instruction on oxygen therapy. Includes supplement oxygen, available portable oxygen systems, continuous and intermittent flow rates, oxygen safety, concentrators, and Medicare reimbursement for oxygen.   Respiratory Medications: - Group verbal and written instruction to review medications for lung disease. Drug class, frequency, complications, importance of spacers, rinsing mouth after steroid MDI's, and proper cleaning methods for nebulizers.      Pulmonary Rehab from 05/02/2015 in [facility] Cardiac and Pulmonary Rehab   Date  05/02/15   Educator  LB   Instruction Review Code  2- meets goals/outcomes      AED/CPR: - Group verbal and written instruction with the use of models to demonstrate the basic use of the AED with the basic ABC's of resuscitation.   Breathing Retraining: - Provides individuals verbal and written instruction on purpose, frequency, and proper technique of diaphragmatic breathing and pursed-lipped breathing. Applies individual practice skills.      Pulmonary Rehab from 05/02/2015 in [facility] Cardiac and Pulmonary Rehab   Date  05/02/15   Educator  LB   Instruction Review Code  2- meets goals/outcomes      Anatomy and Physiology of the Lungs: - Group verbal and written instruction with the use of models to provide basic lung anatomy and physiology related to function, structure and complications of lung disease.   Heart Failure: - Group verbal and written instruction on the basics of heart failure: signs/symptoms, treatments, explanation of ejection fraction, enlarged heart and cardiomyopathy.   Sleep Apnea: - Individual verbal and written instruction to review Obstructive Sleep Apnea. Review of risk factors, methods for diagnosing and types of masks and machines for OSA.   Anxiety: - Provides group, verbal and written instruction on the correlation between heart/lung disease and  anxiety, treatment options, and management of anxiety.   Relaxation: - Provides group, verbal and written instruction about the benefits of relaxation for patients with heart/lung disease. Also provides patients with examples of relaxation techniques.   Knowledge Questionnaire Score:     Knowledge Questionnaire Score - 05/02/15 1330    Knowledge Questionnaire Score   Pre Score 10/10       Core Components/Risk Factors/Patient Goals at Admission:     Personal Goals and Risk Factors at Admission - 05/02/15 1330    Core Components/Risk Factors/Patient Goals on Admission    Weight Management Yes;Obesity   Intervention Weight Management: Develop a combined nutrition and exercise program designed to reach desired caloric intake, while maintaining appropriate intake of nutrient and fiber, sodium and fats, and appropriate energy expenditure required for the weight goal.;Weight Management: Provide education and appropriate resources to help participant work on and attain dietary goals.;Weight Management/Obesity: Establish reasonable short term and long term weight goals.;Obesity: Provide education and appropriate resources to help participant work on and attain dietary goals.   Admit Weight 345 lb 3.2 oz (156.582 kg)   Goal Weight: Short Term 300 lb (136.079 kg)   Goal  Weight: Long Term 220 lb (99.791 kg)   Expected Outcomes Short Term: Continue to assess and modify interventions until short term weight is achieved.;Long Term: Adherence to nutrition and physical activity/exercise program aimed toward attainment of established weight goal.   Sedentary Yes  Ms Hearne likes to walk at the mall.   Intervention Provide advice, education, support and counseling about physical activity/exercise needs.;Develop an individualized exercise prescription for aerobic and resistive training based on initial evaluation findings, risk stratification, comorbidities and participant's personal goals.   Expected  Outcomes Achievement of increased cardiorespiratory fitness and enhanced flexibility, muscular endurance and strength shown through measurements of functional capaciy and personal statement of participant.   Improve shortness of breath with ADL's Yes  Ms Dashnaw had a high score on her SOB questionnaire and does have fear of shortness of breath.   Intervention Provide education, individualized exercise plan and daily activity instruction to help decrease symptoms of SOB with activities of daily living.   Expected Outcomes Short Term: Achieves a reduction of symptoms when performing activities of daily living.   Develop more efficient breathing techniques such as purse lipped breathing and diaphragmatic breathing; and practicing self-pacing with activity Yes   Intervention Provide education, demonstration and support about specific breathing techniuqes utilized for more efficient breathing. Include techniques such as pursed lipped breathing, diaphragmatic breathing and self-pacing activity.   Expected Outcomes Short Term: Participant will be able to demonstrate and use breathing techniques as needed throughout daily activities.   Increase knowledge of respiratory medications and ability to use respiratory devices properly  Yes   Intervention Provide education and demonstration as needed of appropriate use of medications, inhalers, and oxygen therapy.  Ms Frankenberger uses Duoneb and Pulmicort SVN; and ProAir MDI; spacer was given with instructions.   Expected Outcomes Short Term: Achieves understanding of medications use. Understands that oxygen is a medication prescribed by physician. Demonstrates appropriate use of inhaler and oxygen therapy.   Diabetes Yes   Intervention Provide education about signs/symptoms and action to take for hypo/hyperglycemia.;Provide education about proper nutrition, including hydration, and aerobic/resistive exercise prescription along with prescribed medications to achieve blood  glucose in normal ranges: Fasting glucose 65-99 mg/dL   Expected Outcomes Short Term: Participant verbalizes understanding of the signs/symptoms and immediate care of hyper/hypoglycemia, proper foot care and importance of medication, aerobic/resistive exercise and nutrition plan for blood glucose control.;Long Term: Attainment of HbA1C < 7%.   Hypertension Yes   Intervention Provide education on lifestyle modifcations including regular physical activity/exercise, weight management, moderate sodium restriction and increased consumption of fresh fruit, vegetables, and low fat dairy, alcohol moderation, and smoking cessation.;Monitor prescription use compliance.   Expected Outcomes Short Term: Continued assessment and intervention until BP is < 140/53mm HG in hypertensive participants. < 130/34mm HG in hypertensive participants with diabetes, heart failure or chronic kidney disease.;Long Term: Maintenance of blood pressure at goal levels.      Core Components/Risk Factors/Patient Goals Review:    Core Components/Risk Factors/Patient Goals at Discharge (Final Review):    ITP Comments:   Comments: 30 day note review; Ms Shorr planned to start [place] on 06/05/2015, but did not attend class. We will follow-up with another call.

## 2015-06-13 NOTE — Addendum Note (Signed)
Addended by: Karie Fetch on: 06/13/2015 08:45 AM   Modules accepted: Orders

## 2015-06-14 ENCOUNTER — Ambulatory Visit: Payer: Medicaid Other

## 2015-06-16 ENCOUNTER — Ambulatory Visit: Payer: Medicaid Other

## 2015-06-19 ENCOUNTER — Ambulatory Visit: Payer: Medicaid Other

## 2015-06-21 ENCOUNTER — Ambulatory Visit: Payer: Medicaid Other

## 2015-06-21 ENCOUNTER — Other Ambulatory Visit: Payer: Self-pay | Admitting: Nurse Practitioner

## 2015-06-21 DIAGNOSIS — Z1231 Encounter for screening mammogram for malignant neoplasm of breast: Secondary | ICD-10-CM

## 2015-06-23 ENCOUNTER — Ambulatory Visit: Payer: Medicaid Other

## 2015-06-26 ENCOUNTER — Encounter: Payer: Self-pay | Admitting: Cardiovascular Disease

## 2015-06-26 ENCOUNTER — Ambulatory Visit: Payer: Medicaid Other

## 2015-06-26 ENCOUNTER — Ambulatory Visit (INDEPENDENT_AMBULATORY_CARE_PROVIDER_SITE_OTHER): Payer: Medicaid Other | Admitting: Cardiovascular Disease

## 2015-06-26 VITALS — BP 110/62 | HR 83 | Ht 64.5 in | Wt 344.5 lb

## 2015-06-26 DIAGNOSIS — R0602 Shortness of breath: Secondary | ICD-10-CM | POA: Diagnosis not present

## 2015-06-26 DIAGNOSIS — I110 Hypertensive heart disease with heart failure: Secondary | ICD-10-CM | POA: Diagnosis not present

## 2015-06-26 DIAGNOSIS — R0789 Other chest pain: Secondary | ICD-10-CM | POA: Diagnosis not present

## 2015-06-26 DIAGNOSIS — I5033 Acute on chronic diastolic (congestive) heart failure: Secondary | ICD-10-CM

## 2015-06-26 DIAGNOSIS — E1165 Type 2 diabetes mellitus with hyperglycemia: Secondary | ICD-10-CM

## 2015-06-26 MED ORDER — TORSEMIDE 20 MG PO TABS: 40.0000 mg | ORAL_TABLET | Freq: Two times a day (BID) | ORAL | Status: DC | PRN

## 2015-06-26 NOTE — Assessment & Plan Note (Signed)
We have encouraged continued exercise, careful diet management in an effort to lose weight.   Total encounter time more than 25 minutes  Greater than 50% was spent in counseling and coordination of care with the patient 

## 2015-06-26 NOTE — Assessment & Plan Note (Signed)
Long discussion concerning her diet, potential gastric bypass surgeries, what this entails. For now recommended low carbohydrate diet, suggested that she not eat out

## 2015-06-26 NOTE — Assessment & Plan Note (Signed)
8 pound weight loss since her last clinic visits Complemented her on the decline, possibly food changes and diuretic related We are waiting for copies of the lab work for our records. If she continues to have normal renal function, may add extra torsemide after lunch several days per week

## 2015-06-26 NOTE — Patient Instructions (Signed)
You are doing well. No medication changes were made.  We will wait for lab work to adjust torsemide  Please call us if you have new issues that need to be addressed before your next appt.  Your physician wants you to follow-up in: 6 months.  You will receive a reminder letter in the mail two months in advance. If you don't receive a letter, please call our office to schedule the follow-up appointment.

## 2015-06-26 NOTE — Progress Notes (Signed)
Patient ID: [name], female    DOB: 12-04-1970, 45 y.o.   MRN: [id number]  HPI Comments: 45 yo morbidly obese woman with uncontrolled diabetes  with history of chronic diastolic CHF, hospitalization October 2016, November 2016 who presents for  follow-up of her diastolic CHF. Prior history of anemia, asthma  On her last clinic visit, we changed her Lasix to torsemide 40 mg daily On this regimen she has had 8 pound weight loss compared to her prior clinic visit January 2017. She likes to drink water, but trying to cut back Frustrated that she does not feel well when she walks, tried cardiac rehabilitation one time but was discouraged by her inability to exert herself. Continues to have symptoms of shortness of breath on exertion, leg edema has improved  Reports having lab work one month ago, results not available at this time Now wondering whether she should have a gastric surgery as she is not losing weight as she would like Last clinic visit reported that she was eating ou  Other past medical history reviewed Previously had  follow-up with oncology for iron supplement.  Low iron felt secondary to heavy menses. Lab work from October 2016 showing hemoglobin A1c of 13,  Most recent hematocrit improved up to 33   she denies any symptoms of sleep apnea, reports that she had a test several years ago.     normal LV function by echo    Allergies  Allergen Reactions  . Feraheme [Ferumoxytol] Itching    Given pre meds before infusions  . Sulfa Antibiotics Itching    Current Outpatient Prescriptions on File Prior to Visit  Medication Sig Dispense Refill  . acetaminophen (TYLENOL) 325 MG tablet Take 2 tablets (650 mg total) by mouth every 6 (six) hours as needed for mild pain (or Fever >/= 101). 1 tablet 0  . albuterol (PROVENTIL HFA;VENTOLIN HFA) 108 (90 BASE) MCG/ACT inhaler Inhale 2 puffs into the lungs every 6 (six) hours as needed for wheezing or shortness of breath. 1 Inhaler 0   . amLODipine (NORVASC) 10 MG tablet Take 1 tablet (10 mg total) by mouth daily. 30 tablet 0  . aspirin EC 81 MG EC tablet Take 1 tablet (81 mg total) by mouth daily. 30 tablet 0  . budesonide (PULMICORT) 0.25 MG/2ML nebulizer solution Take 2 mLs (0.25 mg total) by nebulization 2 (two) times daily. 60 mL 12  . citalopram (CELEXA) 40 MG tablet Take 40 mg by mouth daily.    . clonazePAM (KLONOPIN) 2 MG tablet Take 2 mg by mouth at bedtime.    . diazepam (VALIUM) 5 MG tablet Take 1 tablet (5 mg total) by mouth every 8 (eight) hours as needed for muscle spasms. 20 tablet 0  . hydrALAZINE (APRESOLINE) 100 MG tablet Take 1 tablet (100 mg total) by mouth every 8 (eight) hours. 60 tablet 0  . ipratropium-albuterol (DUONEB) 0.5-2.5 (3) MG/3ML SOLN Take 3 mLs by nebulization every 6 (six) hours as needed. 360 mL 2  . losartan (COZAAR) 50 MG tablet Take 1 tablet (50 mg total) by mouth daily. 30 tablet 0  . Metoprolol Tartrate 75 MG TABS Take 75 mg by mouth 2 (two) times daily. 60 tablet 0  . potassium chloride (K-DUR,KLOR-CON) 10 MEQ tablet Take 1 tablet (10 mEq total) by mouth 2 (two) times daily. 30 tablet 0   No current facility-administered medications on file prior to visit.    Past Medical History  Diagnosis Date  . Poorly controlled  diabetes mellitus ([place])     a. 11/2014 A1c 13.2.  . Iron deficiency anemia   . Asthma   . Hypertension   . Pericardial effusion     a. 11/2014 CT Chest: Mod effusion.  . Diabetes mellitus without complication ([place])   . CHF (congestive heart failure) [facility])     Past Surgical History  Procedure Laterality Date  . Cesarean section      Social History  reports that she has never smoked. She does not have any smokeless tobacco history on file. She reports that she drinks alcohol. She reports that she does not use illicit drugs.  Family History family history includes Diabetes in her father and mother; Hypertension in her father and mother.   Review of  Systems  Constitutional: Positive for fatigue.  Respiratory: Positive for shortness of breath.   Cardiovascular: Negative.   Gastrointestinal: Negative.   Musculoskeletal: Negative.   Neurological: Negative.   Hematological: Negative.   Psychiatric/Behavioral: Negative.   All other systems reviewed and are negative.   BP 110/62 mmHg  Pulse 83  Ht 5' 4.5" (1.638 m)  Wt 344 lb 8 oz (156.264 kg)  BMI 58.24 kg/m2  SpO2 93%  Physical Exam  Constitutional: She is oriented to person, place, and time. She appears well-developed and well-nourished.  HENT:  Head: Normocephalic.  Nose: Nose normal.  Mouth/Throat: Oropharynx is clear and moist.  Eyes: Conjunctivae are normal. Pupils are equal, round, and reactive to light.  Neck: Normal range of motion. Neck supple. No JVD present.  Cardiovascular: Normal rate, regular rhythm, normal heart sounds and intact distal pulses.  Exam reveals no gallop and no friction rub.   No murmur heard. Pulmonary/Chest: Effort normal and breath sounds normal. No respiratory distress. She has no wheezes. She has no rales. She exhibits no tenderness.  Abdominal: Soft. Bowel sounds are normal. She exhibits no distension. There is no tenderness.  Musculoskeletal: Normal range of motion. She exhibits no edema or tenderness.  Lymphadenopathy:    She has no cervical adenopathy.  Neurological: She is alert and oriented to person, place, and time. Coordination normal.  Skin: Skin is warm and dry. No rash noted. No erythema.  Psychiatric: She has a normal mood and affect. Her behavior is normal. Judgment and thought content normal.

## 2015-06-26 NOTE — Assessment & Plan Note (Signed)
Blood pressure is well controlled on today's visit. No changes made to the medications. Blood pressure starting to run low, recommended that she monitor this as medications may need to be adjusted if this continues to decline with diuresis

## 2015-06-28 ENCOUNTER — Ambulatory Visit: Payer: Medicaid Other

## 2015-06-30 ENCOUNTER — Ambulatory Visit: Payer: Medicaid Other

## 2015-07-03 ENCOUNTER — Ambulatory Visit: Payer: Medicaid Other

## 2015-07-03 ENCOUNTER — Telehealth: Payer: Self-pay | Admitting: Respiratory Therapy

## 2015-07-03 NOTE — Telephone Encounter (Signed)
Called [name] and left her a message. She had her initial evaluation on 05/02/2015 and has not started her exercise in [facility], so I mentioned we may discharge her until she can attend regularly. I will wait for a call back from her on starting [facility] or a discharge.

## 2015-07-04 ENCOUNTER — Ambulatory Visit: Payer: Medicaid Other

## 2015-07-05 ENCOUNTER — Ambulatory Visit: Payer: Medicaid Other

## 2015-07-07 ENCOUNTER — Ambulatory Visit: Payer: Medicaid Other

## 2015-07-10 ENCOUNTER — Ambulatory Visit: Payer: Medicaid Other

## 2015-07-11 ENCOUNTER — Encounter: Payer: Self-pay | Admitting: Respiratory Therapy

## 2015-07-11 DIAGNOSIS — J4540 Moderate persistent asthma, uncomplicated: Secondary | ICD-10-CM

## 2015-07-11 DIAGNOSIS — I5032 Chronic diastolic (congestive) heart failure: Secondary | ICD-10-CM

## 2015-07-11 NOTE — Progress Notes (Signed)
Pulmonary Individual Treatment Plan  Patient Details  Name: [name] MRN: [id number] Date of Birth: [birth year] Referring Provider:  Dr Lamonte Sakai  Initial Encounter Date:       Pulmonary Rehab from 05/02/2015 in [facility] Cardiac and Pulmonary Rehab   Date  05/02/15      Visit Diagnosis: Asthma, moderate persistent, uncomplicated  Chronic diastolic congestive heart failure ([place])  Morbid obesity, unspecified obesity type ([place])  [name] Home Medications on Admission:  Current outpatient prescriptions:    acetaminophen (TYLENOL) 325 MG tablet, Take 2 tablets (650 mg total) by mouth every 6 (six) hours as needed for mild pain (or Fever >/= 101)., Disp: 1 tablet, Rfl: 0   albuterol (PROVENTIL HFA;VENTOLIN HFA) 108 (90 BASE) MCG/ACT inhaler, Inhale 2 puffs into the lungs every 6 (six) hours as needed for wheezing or shortness of breath., Disp: 1 Inhaler, Rfl: 0   amLODipine (NORVASC) 10 MG tablet, Take 1 tablet (10 mg total) by mouth daily., Disp: 30 tablet, Rfl: 0   aspirin EC 81 MG EC tablet, Take 1 tablet (81 mg total) by mouth daily., Disp: 30 tablet, Rfl: 0   atorvastatin (LIPITOR) 80 MG tablet, Take 80 mg by mouth daily., Disp: , Rfl:    budesonide (PULMICORT) 0.25 MG/2ML nebulizer solution, Take 2 mLs (0.25 mg total) by nebulization 2 (two) times daily., Disp: 60 mL, Rfl: 12   citalopram (CELEXA) 40 MG tablet, Take 40 mg by mouth daily., Disp: , Rfl:    clonazePAM (KLONOPIN) 2 MG tablet, Take 2 mg by mouth at bedtime., Disp: , Rfl:    diazepam (VALIUM) 5 MG tablet, Take 1 tablet (5 mg total) by mouth every 8 (eight) hours as needed for muscle spasms., Disp: 20 tablet, Rfl: 0   hydrALAZINE (APRESOLINE) 100 MG tablet, Take 1 tablet (100 mg total) by mouth every 8 (eight) hours., Disp: 60 tablet, Rfl: 0   insulin aspart (NOVOLOG FLEXPEN) 100 UNIT/ML FlexPen, Sliding scale, Disp: , Rfl:    Insulin Degludec (TRESIBA FLEXTOUCH) 100 UNIT/ML SOPN, Inject 38 units of lipase into  the skin at bedtime., Disp: , Rfl:    ipratropium-albuterol (DUONEB) 0.5-2.5 (3) MG/3ML SOLN, Take 3 mLs by nebulization every 6 (six) hours as needed., Disp: 360 mL, Rfl: 2   losartan (COZAAR) 50 MG tablet, Take 1 tablet (50 mg total) by mouth daily., Disp: 30 tablet, Rfl: 0   Metoprolol Tartrate 75 MG TABS, Take 75 mg by mouth 2 (two) times daily., Disp: 60 tablet, Rfl: 0   potassium chloride (K-DUR,KLOR-CON) 10 MEQ tablet, Take 1 tablet (10 mEq total) by mouth 2 (two) times daily., Disp: 30 tablet, Rfl: 0   torsemide (DEMADEX) 20 MG tablet, Take 2 tablets (40 mg total) by mouth 2 (two) times daily as needed., Disp: 120 tablet, Rfl: 6  Past Medical History: Past Medical History  Diagnosis Date   Poorly controlled diabetes mellitus ([place])     a. 11/2014 A1c 13.2.   Iron deficiency anemia    Asthma    Hypertension    Pericardial effusion     a. 11/2014 CT Chest: Mod effusion.   Diabetes mellitus without complication (HCC)    CHF (congestive heart failure) ([place])     Tobacco Use: History  Smoking status   Never Smoker   Smokeless tobacco   Not on file    Labs: Recent Review Flowsheet Data    Labs for ITP Cardiac and Pulmonary Rehab Latest Ref Rng 12/15/2014 01/03/2015   Cholestrol 0 -  200 mg/dL - 232(H)   LDLCALC 0 - 99 mg/dL - 169(H)   HDL >40 mg/dL - 41   Trlycerides <150 mg/dL - 112   Hemoglobin A1c 4.0 - 6.0 % 13.2(H) -       ADL [facility]:     Pulmonary Assessment Scores      05/02/15 1330       ADL [facility]SD   ADL Phase Entry     SOB Score total 91     Rest 2     Walk 3     Stairs 5     Bath 4     Dress 3     Shop 3        Pulmonary Function Assessment:     Pulmonary Function Assessment - 05/02/15 1330    Initial Spirometry Results   FVC% 48 %   FEV1% 45 %   FEV1/FVC Ratio 78.27   Post Bronchodilator Spirometry Results   FVC% 52 %   FEV1% 45 %   FEV1/FVC Ratio 75.3   Breath   Bilateral Breath Sounds Clear   Shortness of Breath Yes;Fear  of Shortness of Breath;Limiting activity      Exercise Target Goals:    Exercise Program Goal: Individual exercise prescription set with THRR, safety & activity barriers. Participant demonstrates ability to understand and report RPE using BORG scale, to self-measure pulse accurately, and to acknowledge the importance of the exercise prescription.  Exercise Prescription Goal: Starting with aerobic activity 30 plus minutes a day, 3 days per week for initial exercise prescription. Provide home exercise prescription and guidelines that participant acknowledges understanding prior to discharge.  Activity Barriers & Risk Stratification:     Activity Barriers & Cardiac Risk Stratification - 05/02/15 1330    Activity Barriers & Cardiac Risk Stratification   Activity Barriers Back Problems;Shortness of Breath;Deconditioning;Muscular Weakness   Cardiac Risk Stratification Moderate      6 Minute Walk:     6 Minute Walk      05/02/15 1645       6 Minute Walk   Phase Initial     Distance 555 feet     Walk Time 4 minutes     # of Rest Breaks 3     RPE 15     Perceived Dyspnea  4     Symptoms No     Resting HR 76 bpm     Resting BP 126/76 mmHg     Max Ex. HR 92 bpm     Max Ex. BP 144/78 mmHg        Initial Exercise Prescription:     Initial Exercise Prescription - 05/02/15 1600    Date of Initial Exercise RX and Referring Provider   Date 05/02/15   Treadmill   MPH 1.5   Grade 0   Minutes 10   Recumbant Bike   Level 2   RPM 40   Watts 20   Minutes 10   NuStep   Level 2   Watts 40   Minutes 10   Arm Ergometer   Level 1   Watts 10   Minutes 10   Recumbant Elliptical   Level 2   RPM 40   Watts 20   Minutes 10   REL-XR   Level 2   Watts 40   Minutes 10   T5 Nustep   Level 1   Watts 15   Minutes 10   Biostep-RELP   Level 2   Watts 40  Minutes 10   Prescription Details   Frequency (times per week) 3   Duration Progress to 30 minutes of continuous  aerobic without signs/symptoms of physical distress   Intensity   THRR REST +  30   Ratings of Perceived Exertion 11-15   Perceived Dyspnea 0-4   Progression   Progression Continue progressive overload as per policy without signs/symptoms or physical distress.   Resistance Training   Training Prescription Yes   Weight 1   Reps 10-15      Perform Capillary Blood Glucose checks as needed.  Exercise Prescription Changes:   Exercise Comments:   Discharge Exercise Prescription (Final Exercise Prescription Changes):    Nutrition:  Target Goals: Understanding of nutrition guidelines, daily intake of sodium 1500mg , cholesterol 200mg , calories 30% from fat and 7% or less from saturated fats, daily to have 5 or more servings of fruits and vegetables.  Biometrics:    Nutrition Therapy Plan and Nutrition Goals:     Nutrition Therapy & Goals - 05/02/15 1330    Nutrition Therapy   Diet Ms Haskamp is diabetic and has meet with a dietitian about 2 years ago. She would like to meet with them again. Her husband does the cooking and is healthy with low salt. Ms Jakab drinks 5-6 hospital cups of water a day.       Nutrition Discharge: Rate Your Plate Scores:   Psychosocial: Target Goals: Acknowledge presence or absence of depression, maximize coping skills, provide positive support system. Participant is able to verbalize types and ability to use techniques and skills needed for reducing stress and depression.  Initial Review & Psychosocial Screening:     Initial Psych Review & Screening - 05/02/15 [place]? Yes   Comments Ms Throne had good family support from her husband and children. She is anxious to feel better and be more active since her 2 admissions for CHF last October 2016.   Barriers   Psychosocial barriers to participate in program The patient should benefit from training in stress management and relaxation.   Screening  Interventions   Interventions Encouraged to exercise;Program counselor consult      Quality of Life Scores:     Quality of Life - 05/02/15 1330    Quality of Life Scores   Health/Function Pre 10.84 %   Socioeconomic Pre 11.86 %   Psych/Spiritual Pre 13.93 %   Family Pre 16.8 %   GLOBAL Pre 12.51 %      PHQ-9:     Recent Review Flowsheet Data    Depression screen [facility] 2/9 05/02/2015   Decreased Interest 2   Down, Depressed, Hopeless 1   PHQ - 2 Score 3   Altered sleeping 2   Tired, decreased energy 3   Change in appetite 1   Feeling bad or failure about yourself  2   Trouble concentrating 2   Moving slowly or fidgety/restless 1   Suicidal thoughts 1   PHQ-9 Score 15   Difficult doing work/chores Somewhat difficult      Psychosocial Evaluation and Intervention:   Psychosocial Re-Evaluation:  Education: Education Goals: Education classes will be provided on a weekly basis, covering required topics. Participant will state understanding/return demonstration of topics presented.  Learning Barriers/Preferences:     Learning Barriers/Preferences - 05/02/15 1330    Learning Barriers/Preferences   Learning Barriers None   Learning Preferences Group Instruction;Individual Instruction;Pictoral;Skilled Demonstration;Verbal Instruction;Video;Written Material      Education  Topics: Initial Evaluation Education: - Verbal, written and demonstration of respiratory meds, RPE/PD scales, oximetry and breathing techniques. Instruction on use of nebulizers and MDIs: cleaning and proper use, rinsing mouth with steroid doses and importance of monitoring MDI activations.          Pulmonary Rehab from 05/02/2015 in [facility] Cardiac and Pulmonary Rehab   Date  05/02/15   Educator  LB   Instruction Review Code  2- meets goals/outcomes      General Nutrition Guidelines/Fats and Fiber: -Group instruction provided by verbal, written material, models and posters to present the general  guidelines for heart healthy nutrition. Gives an explanation and review of dietary fats and fiber.   Controlling Sodium/Reading Food Labels: -Group verbal and written material supporting the discussion of sodium use in heart healthy nutrition. Review and explanation with models, verbal and written materials for utilization of the food label.   Exercise Physiology & Risk Factors: - Group verbal and written instruction with models to review the exercise physiology of the cardiovascular system and associated critical values. Details cardiovascular disease risk factors and the goals associated with each risk factor.   Aerobic Exercise & Resistance Training: - Gives group verbal and written discussion on the health impact of inactivity. On the components of aerobic and resistive training programs and the benefits of this training and how to safely progress through these programs.   Flexibility, Balance, General Exercise Guidelines: - Provides group verbal and written instruction on the benefits of flexibility and balance training programs. Provides general exercise guidelines with specific guidelines to those with heart or lung disease. Demonstration and skill practice provided.   Stress Management: - Provides group verbal and written instruction about the health risks of elevated stress, cause of high stress, and healthy ways to reduce stress.   Depression: - Provides group verbal and written instruction on the correlation between heart/lung disease and depressed mood, treatment options, and the stigmas associated with seeking treatment.   Exercise & Equipment Safety: - Individual verbal instruction and demonstration of equipment use and safety with use of the equipment.   Infection Prevention: - Provides verbal and written material to individual with discussion of infection control including proper hand washing and proper equipment cleaning during exercise session.   Falls  Prevention: - Provides verbal and written material to individual with discussion of falls prevention and safety.      Pulmonary Rehab from 05/02/2015 in [facility] Cardiac and Pulmonary Rehab   Date  05/02/15   Educator  LB   Instruction Review Code  2- meets goals/outcomes      Diabetes: - Individual verbal and written instruction to review signs/symptoms of diabetes, desired ranges of glucose level fasting, after meals and with exercise. Advice that pre and post exercise glucose checks will be done for 3 sessions at entry of program.   Chronic Lung Diseases: - Group verbal and written instruction to review new updates, new respiratory medications, new advancements in procedures and treatments. Provide informative websites and "800" numbers of self-education.   Lung Procedures: - Group verbal and written instruction to describe testing methods done to diagnose lung disease. Review the outcome of test results. Describe the treatment choices: Pulmonary Function Tests, ABGs and oximetry.   Energy Conservation: - Provide group verbal and written instruction for methods to conserve energy, plan and organize activities. Instruct on pacing techniques, use of adaptive equipment and posture/positioning to relieve shortness of breath.   Triggers: - Group verbal and written instruction to review types  of environmental controls: home humidity, furnaces, filters, dust mite/pet prevention, HEPA vacuums. To discuss weather changes, air quality and the benefits of nasal washing.   Exacerbations: - Group verbal and written instruction to provide: warning signs, infection symptoms, calling MD promptly, preventive modes, and value of vaccinations. Review: effective airway clearance, coughing and/or vibration techniques. Create an Sports administrator.   Oxygen: - Individual and group verbal and written instruction on oxygen therapy. Includes supplement oxygen, available portable oxygen systems, continuous and  intermittent flow rates, oxygen safety, concentrators, and Medicare reimbursement for oxygen.   Respiratory Medications: - Group verbal and written instruction to review medications for lung disease. Drug class, frequency, complications, importance of spacers, rinsing mouth after steroid MDI's, and proper cleaning methods for nebulizers.      Pulmonary Rehab from 05/02/2015 in [facility] Cardiac and Pulmonary Rehab   Date  05/02/15   Educator  LB   Instruction Review Code  2- meets goals/outcomes      AED/CPR: - Group verbal and written instruction with the use of models to demonstrate the basic use of the AED with the basic ABC's of resuscitation.   Breathing Retraining: - Provides individuals verbal and written instruction on purpose, frequency, and proper technique of diaphragmatic breathing and pursed-lipped breathing. Applies individual practice skills.      Pulmonary Rehab from 05/02/2015 in [facility] Cardiac and Pulmonary Rehab   Date  05/02/15   Educator  LB   Instruction Review Code  2- meets goals/outcomes      Anatomy and Physiology of the Lungs: - Group verbal and written instruction with the use of models to provide basic lung anatomy and physiology related to function, structure and complications of lung disease.   Heart Failure: - Group verbal and written instruction on the basics of heart failure: signs/symptoms, treatments, explanation of ejection fraction, enlarged heart and cardiomyopathy.   Sleep Apnea: - Individual verbal and written instruction to review Obstructive Sleep Apnea. Review of risk factors, methods for diagnosing and types of masks and machines for OSA.   Anxiety: - Provides group, verbal and written instruction on the correlation between heart/lung disease and anxiety, treatment options, and management of anxiety.   Relaxation: - Provides group, verbal and written instruction about the benefits of relaxation for patients with heart/lung disease. Also  provides patients with examples of relaxation techniques.   Knowledge Questionnaire Score:     Knowledge Questionnaire Score - 05/02/15 1330    Knowledge Questionnaire Score   Pre Score 10/10       Core Components/Risk Factors/Patient Goals at Admission:     Personal Goals and Risk Factors at Admission - 05/02/15 1330    Core Components/Risk Factors/Patient Goals on Admission    Weight Management Yes;Obesity   Intervention Weight Management: Develop a combined nutrition and exercise program designed to reach desired caloric intake, while maintaining appropriate intake of nutrient and fiber, sodium and fats, and appropriate energy expenditure required for the weight goal.;Weight Management: Provide education and appropriate resources to help participant work on and attain dietary goals.;Weight Management/Obesity: Establish reasonable short term and long term weight goals.;Obesity: Provide education and appropriate resources to help participant work on and attain dietary goals.   Admit Weight 345 lb 3.2 oz (156.582 kg)   Goal Weight: Short Term 300 lb (136.079 kg)   Goal Weight: Long Term 220 lb (99.791 kg)   Expected Outcomes Short Term: Continue to assess and modify interventions until short term weight is achieved.;Long Term: Adherence to nutrition and physical  activity/exercise program aimed toward attainment of established weight goal.   Sedentary Yes  Ms O[place]jo likes to walk at the mall.   Intervention Provide advice, education, support and counseling about physical activity/exercise needs.;Develop an individualized exercise prescription for aerobic and resistive training based on initial evaluation findings, risk stratification, comorbidities and participant's personal goals.   Expected Outcomes Achievement of increased cardiorespiratory fitness and enhanced flexibility, muscular endurance and strength shown through measurements of functional capaciy and personal statement of  participant.   Improve shortness of breath with ADL's Yes  Ms Shaheed had a high score on her SOB questionnaire and does have fear of shortness of breath.   Intervention Provide education, individualized exercise plan and daily activity instruction to help decrease symptoms of SOB with activities of daily living.   Expected Outcomes Short Term: Achieves a reduction of symptoms when performing activities of daily living.   Develop more efficient breathing techniques such as purse lipped breathing and diaphragmatic breathing; and practicing self-pacing with activity Yes   Intervention Provide education, demonstration and support about specific breathing techniuqes utilized for more efficient breathing. Include techniques such as pursed lipped breathing, diaphragmatic breathing and self-pacing activity.   Expected Outcomes Short Term: Participant will be able to demonstrate and use breathing techniques as needed throughout daily activities.   Increase knowledge of respiratory medications and ability to use respiratory devices properly  Yes   Intervention Provide education and demonstration as needed of appropriate use of medications, inhalers, and oxygen therapy.  Ms Meader uses Duoneb and Pulmicort SVN; and ProAir MDI; spacer was given with instructions.   Expected Outcomes Short Term: Achieves understanding of medications use. Understands that oxygen is a medication prescribed by physician. Demonstrates appropriate use of inhaler and oxygen therapy.   Diabetes Yes   Intervention Provide education about signs/symptoms and action to take for hypo/hyperglycemia.;Provide education about proper nutrition, including hydration, and aerobic/resistive exercise prescription along with prescribed medications to achieve blood glucose in normal ranges: Fasting glucose 65-99 mg/dL   Expected Outcomes Short Term: Participant verbalizes understanding of the signs/symptoms and immediate care of hyper/hypoglycemia,  proper foot care and importance of medication, aerobic/resistive exercise and nutrition plan for blood glucose control.;Long Term: Attainment of HbA1C < 7%.   Hypertension Yes   Intervention Provide education on lifestyle modifcations including regular physical activity/exercise, weight management, moderate sodium restriction and increased consumption of fresh fruit, vegetables, and low fat dairy, alcohol moderation, and smoking cessation.;Monitor prescription use compliance.   Expected Outcomes Short Term: Continued assessment and intervention until BP is < 140/57mm HG in hypertensive participants. < 130/49mm HG in hypertensive participants with diabetes, heart failure or chronic kidney disease.;Long Term: Maintenance of blood pressure at goal levels.      Core Components/Risk Factors/Patient Goals Review:    Core Components/Risk Factors/Patient Goals at Discharge (Final Review):    ITP Comments:     ITP Comments      07/11/15 0716           ITP Comments Called Ms Hardey 07/10/15. She has not attended since her initial evaluation on 04/29/15. Ms Alvy Bimler does plan to start on 07/12/15.          Comments: 30 Day Progress Note

## 2015-07-12 ENCOUNTER — Ambulatory Visit: Payer: Medicaid Other

## 2015-07-14 ENCOUNTER — Ambulatory Visit: Payer: Medicaid Other

## 2015-07-17 ENCOUNTER — Ambulatory Visit: Payer: Medicaid Other

## 2015-07-19 ENCOUNTER — Ambulatory Visit: Payer: Medicaid Other

## 2015-07-21 ENCOUNTER — Ambulatory Visit: Payer: Medicaid Other

## 2015-07-26 ENCOUNTER — Ambulatory Visit: Payer: Medicaid Other

## 2015-07-28 ENCOUNTER — Ambulatory Visit: Payer: Medicaid Other

## 2015-07-31 ENCOUNTER — Ambulatory Visit: Payer: Medicaid Other

## 2015-08-02 ENCOUNTER — Ambulatory Visit: Payer: Medicaid Other

## 2015-08-08 ENCOUNTER — Encounter: Payer: Self-pay | Admitting: Respiratory Therapy

## 2015-08-08 NOTE — Progress Notes (Signed)
Pulmonary Individual Treatment Plan  Patient Details  Name: [name] MRN: [id number] Date of Birth: 11-Jul-1970 Referring Provider:  Dr Lamonte Sakai  Initial Encounter Date: 05/02/2015      Pulmonary Rehab from 05/02/2015 in [facility] Cardiac and Pulmonary Rehab   Date  05/02/15      Visit Diagnosis: Asthma, CHF  [name] Home Medications on Admission:  Current outpatient prescriptions:    acetaminophen (TYLENOL) 325 MG tablet, Take 2 tablets (650 mg total) by mouth every 6 (six) hours as needed for mild pain (or Fever >/= 101)., Disp: 1 tablet, Rfl: 0   albuterol (PROVENTIL HFA;VENTOLIN HFA) 108 (90 BASE) MCG/ACT inhaler, Inhale 2 puffs into the lungs every 6 (six) hours as needed for wheezing or shortness of breath., Disp: 1 Inhaler, Rfl: 0   amLODipine (NORVASC) 10 MG tablet, Take 1 tablet (10 mg total) by mouth daily., Disp: 30 tablet, Rfl: 0   aspirin EC 81 MG EC tablet, Take 1 tablet (81 mg total) by mouth daily., Disp: 30 tablet, Rfl: 0   atorvastatin (LIPITOR) 80 MG tablet, Take 80 mg by mouth daily., Disp: , Rfl:    budesonide (PULMICORT) 0.25 MG/2ML nebulizer solution, Take 2 mLs (0.25 mg total) by nebulization 2 (two) times daily., Disp: 60 mL, Rfl: 12   citalopram (CELEXA) 40 MG tablet, Take 40 mg by mouth daily., Disp: , Rfl:    clonazePAM (KLONOPIN) 2 MG tablet, Take 2 mg by mouth at bedtime., Disp: , Rfl:    diazepam (VALIUM) 5 MG tablet, Take 1 tablet (5 mg total) by mouth every 8 (eight) hours as needed for muscle spasms., Disp: 20 tablet, Rfl: 0   hydrALAZINE (APRESOLINE) 100 MG tablet, Take 1 tablet (100 mg total) by mouth every 8 (eight) hours., Disp: 60 tablet, Rfl: 0   insulin aspart (NOVOLOG FLEXPEN) 100 UNIT/ML FlexPen, Sliding scale, Disp: , Rfl:    Insulin Degludec (TRESIBA FLEXTOUCH) 100 UNIT/ML SOPN, Inject 38 units of lipase into the skin at bedtime., Disp: , Rfl:    ipratropium-albuterol (DUONEB) 0.5-2.5 (3) MG/3ML SOLN, Take 3 mLs by nebulization  every 6 (six) hours as needed., Disp: 360 mL, Rfl: 2   losartan (COZAAR) 50 MG tablet, Take 1 tablet (50 mg total) by mouth daily., Disp: 30 tablet, Rfl: 0   Metoprolol Tartrate 75 MG TABS, Take 75 mg by mouth 2 (two) times daily., Disp: 60 tablet, Rfl: 0   potassium chloride (K-DUR,KLOR-CON) 10 MEQ tablet, Take 1 tablet (10 mEq total) by mouth 2 (two) times daily., Disp: 30 tablet, Rfl: 0   torsemide (DEMADEX) 20 MG tablet, Take 2 tablets (40 mg total) by mouth 2 (two) times daily as needed., Disp: 120 tablet, Rfl: 6  Past Medical History: Past Medical History  Diagnosis Date   Poorly controlled diabetes mellitus ([place])     a. 11/2014 A1c 13.2.   Iron deficiency anemia    Asthma    Hypertension    Pericardial effusion     a. 11/2014 CT Chest: Mod effusion.   Diabetes mellitus without complication (HCC)    CHF (congestive heart failure) ([place])     Tobacco Use: History  Smoking status   Never Smoker   Smokeless tobacco   Not on file    Labs: Recent Review Flowsheet Data    Labs for ITP Cardiac and Pulmonary Rehab Latest Ref Rng 12/15/2014 01/03/2015   Cholestrol 0 - 200 mg/dL - 232(H)   LDLCALC 0 - 99 mg/dL - 169(H)   HDL >  40 mg/dL - 41   Trlycerides <150 mg/dL - 112   Hemoglobin A1c 4.0 - 6.0 % 13.2(H) -       ADL UCSD:     Pulmonary Assessment Scores      05/02/15 1330       ADL UCSD   ADL Phase Entry     SOB Score total 91     Rest 2     Walk 3     Stairs 5     Bath 4     Dress 3     Shop 3        Pulmonary Function Assessment:     Pulmonary Function Assessment - 05/02/15 1330    Initial Spirometry Results   FVC% 48 %   FEV1% 45 %   FEV1/FVC Ratio 78.27   Post Bronchodilator Spirometry Results   FVC% 52 %   FEV1% 45 %   FEV1/FVC Ratio 75.3   Breath   Bilateral Breath Sounds Clear   Shortness of Breath Yes;Fear of Shortness of Breath;Limiting activity      Exercise Target Goals:    Exercise Program Goal: Individual exercise  prescription set with THRR, safety & activity barriers. Participant demonstrates ability to understand and report RPE using BORG scale, to self-measure pulse accurately, and to acknowledge the importance of the exercise prescription.  Exercise Prescription Goal: Starting with aerobic activity 30 plus minutes a day, 3 days per week for initial exercise prescription. Provide home exercise prescription and guidelines that participant acknowledges understanding prior to discharge.  Activity Barriers & Risk Stratification:     Activity Barriers & Cardiac Risk Stratification - 05/02/15 1330    Activity Barriers & Cardiac Risk Stratification   Activity Barriers Back Problems;Shortness of Breath;Deconditioning;Muscular Weakness   Cardiac Risk Stratification Moderate      6 Minute Walk:     6 Minute Walk      05/02/15 1645       6 Minute Walk   Phase Initial     Distance 555 feet     Walk Time 4 minutes     # of Rest Breaks 3     RPE 15     Perceived Dyspnea  4     Symptoms No     Resting HR 76 bpm     Resting BP 126/76 mmHg     Max Ex. HR 92 bpm     Max Ex. BP 144/78 mmHg        Initial Exercise Prescription:     Initial Exercise Prescription - 05/02/15 1600    Date of Initial Exercise RX and Referring Provider   Date 05/02/15   Treadmill   MPH 1.5   Grade 0   Minutes 10   Recumbant Bike   Level 2   RPM 40   Watts 20   Minutes 10   NuStep   Level 2   Watts 40   Minutes 10   Arm Ergometer   Level 1   Watts 10   Minutes 10   Recumbant Elliptical   Level 2   RPM 40   Watts 20   Minutes 10   REL-XR   Level 2   Watts 40   Minutes 10   T5 Nustep   Level 1   Watts 15   Minutes 10   Biostep-RELP   Level 2   Watts 40   Minutes 10   Prescription Details   Frequency (times per week) 3  Duration Progress to 30 minutes of continuous aerobic without signs/symptoms of physical distress   Intensity   THRR REST +  30   Ratings of Perceived Exertion 11-15    Perceived Dyspnea 0-4   Progression   Progression Continue progressive overload as per policy without signs/symptoms or physical distress.   Resistance Training   Training Prescription Yes   Weight 1   Reps 10-15      Perform Capillary Blood Glucose checks as needed.  Exercise Prescription Changes:   Exercise Comments:   Discharge Exercise Prescription (Final Exercise Prescription Changes):    Nutrition:  Target Goals: Understanding of nutrition guidelines, daily intake of sodium 1500mg , cholesterol 200mg , calories 30% from fat and 7% or less from saturated fats, daily to have 5 or more servings of fruits and vegetables.  Biometrics:    Nutrition Therapy Plan and Nutrition Goals:     Nutrition Therapy & Goals - 05/02/15 1330    Nutrition Therapy   Diet Ms Goodnow is diabetic and has meet with a dietitian about 2 years ago. She would like to meet with them again. Her husband does the cooking and is healthy with low salt. Ms Arnstein drinks 5-6 hospital cups of water a day.       Nutrition Discharge: Rate Your Plate Scores:   Psychosocial: Target Goals: Acknowledge presence or absence of depression, maximize coping skills, provide positive support system. Participant is able to verbalize types and ability to use techniques and skills needed for reducing stress and depression.  Initial Review & Psychosocial Screening:     Initial Psych Review & Screening - 05/02/15 [place]? Yes   Comments Ms Ogbonna had good family support from her husband and children. She is anxious to feel better and be more active since her 2 admissions for CHF last October 2016.   Barriers   Psychosocial barriers to participate in program The patient should benefit from training in stress management and relaxation.   Screening Interventions   Interventions Encouraged to exercise;Program counselor consult      Quality of Life Scores:     Quality of  Life - 05/02/15 1330    Quality of Life Scores   Health/Function Pre 10.84 %   Socioeconomic Pre 11.86 %   Psych/Spiritual Pre 13.93 %   Family Pre 16.8 %   GLOBAL Pre 12.51 %      PHQ-9:     Recent Review Flowsheet Data    Depression screen [facility] 2/9 05/02/2015   Decreased Interest 2   Down, Depressed, Hopeless 1   PHQ - 2 Score 3   Altered sleeping 2   Tired, decreased energy 3   Change in appetite 1   Feeling bad or failure about yourself  2   Trouble concentrating 2   Moving slowly or fidgety/restless 1   Suicidal thoughts 1   PHQ-9 Score 15   Difficult doing work/chores Somewhat difficult      Psychosocial Evaluation and Intervention:   Psychosocial Re-Evaluation:  Education: Education Goals: Education classes will be provided on a weekly basis, covering required topics. Participant will state understanding/return demonstration of topics presented.  Learning Barriers/Preferences:     Learning Barriers/Preferences - 05/02/15 1330    Learning Barriers/Preferences   Learning Barriers None   Learning Preferences Group Instruction;Individual Instruction;Pictoral;Skilled Demonstration;Verbal Instruction;Video;Written Material      Education Topics: Initial Evaluation Education: - Verbal, written and demonstration of respiratory meds, RPE/PD scales, oximetry  and breathing techniques. Instruction on use of nebulizers and MDIs: cleaning and proper use, rinsing mouth with steroid doses and importance of monitoring MDI activations.          Pulmonary Rehab from 05/02/2015 in [facility] Cardiac and Pulmonary Rehab   Date  05/02/15   Educator  LB   Instruction Review Code  2- meets goals/outcomes      General Nutrition Guidelines/Fats and Fiber: -Group instruction provided by verbal, written material, models and posters to present the general guidelines for heart healthy nutrition. Gives an explanation and review of dietary fats and fiber.   Controlling Sodium/Reading Food  Labels: -Group verbal and written material supporting the discussion of sodium use in heart healthy nutrition. Review and explanation with models, verbal and written materials for utilization of the food label.   Exercise Physiology & Risk Factors: - Group verbal and written instruction with models to review the exercise physiology of the cardiovascular system and associated critical values. Details cardiovascular disease risk factors and the goals associated with each risk factor.   Aerobic Exercise & Resistance Training: - Gives group verbal and written discussion on the health impact of inactivity. On the components of aerobic and resistive training programs and the benefits of this training and how to safely progress through these programs.   Flexibility, Balance, General Exercise Guidelines: - Provides group verbal and written instruction on the benefits of flexibility and balance training programs. Provides general exercise guidelines with specific guidelines to those with heart or lung disease. Demonstration and skill practice provided.   Stress Management: - Provides group verbal and written instruction about the health risks of elevated stress, cause of high stress, and healthy ways to reduce stress.   Depression: - Provides group verbal and written instruction on the correlation between heart/lung disease and depressed mood, treatment options, and the stigmas associated with seeking treatment.   Exercise & Equipment Safety: - Individual verbal instruction and demonstration of equipment use and safety with use of the equipment.   Infection Prevention: - Provides verbal and written material to individual with discussion of infection control including proper hand washing and proper equipment cleaning during exercise session.   Falls Prevention: - Provides verbal and written material to individual with discussion of falls prevention and safety.      Pulmonary Rehab from  05/02/2015 in [facility] Cardiac and Pulmonary Rehab   Date  05/02/15   Educator  LB   Instruction Review Code  2- meets goals/outcomes      Diabetes: - Individual verbal and written instruction to review signs/symptoms of diabetes, desired ranges of glucose level fasting, after meals and with exercise. Advice that pre and post exercise glucose checks will be done for 3 sessions at entry of program.   Chronic Lung Diseases: - Group verbal and written instruction to review new updates, new respiratory medications, new advancements in procedures and treatments. Provide informative websites and "800" numbers of self-education.   Lung Procedures: - Group verbal and written instruction to describe testing methods done to diagnose lung disease. Review the outcome of test results. Describe the treatment choices: Pulmonary Function Tests, ABGs and oximetry.   Energy Conservation: - Provide group verbal and written instruction for methods to conserve energy, plan and organize activities. Instruct on pacing techniques, use of adaptive equipment and posture/positioning to relieve shortness of breath.   Triggers: - Group verbal and written instruction to review types of environmental controls: home humidity, furnaces, filters, dust mite/pet prevention, HEPA vacuums. To discuss weather  changes, air quality and the benefits of nasal washing.   Exacerbations: - Group verbal and written instruction to provide: warning signs, infection symptoms, calling MD promptly, preventive modes, and value of vaccinations. Review: effective airway clearance, coughing and/or vibration techniques. Create an Sports administrator.   Oxygen: - Individual and group verbal and written instruction on oxygen therapy. Includes supplement oxygen, available portable oxygen systems, continuous and intermittent flow rates, oxygen safety, concentrators, and Medicare reimbursement for oxygen.   Respiratory Medications: - Group verbal and  written instruction to review medications for lung disease. Drug class, frequency, complications, importance of spacers, rinsing mouth after steroid MDI's, and proper cleaning methods for nebulizers.      Pulmonary Rehab from 05/02/2015 in [facility] Cardiac and Pulmonary Rehab   Date  05/02/15   Educator  LB   Instruction Review Code  2- meets goals/outcomes      AED/CPR: - Group verbal and written instruction with the use of models to demonstrate the basic use of the AED with the basic ABC's of resuscitation.   Breathing Retraining: - Provides individuals verbal and written instruction on purpose, frequency, and proper technique of diaphragmatic breathing and pursed-lipped breathing. Applies individual practice skills.      Pulmonary Rehab from 05/02/2015 in [facility] Cardiac and Pulmonary Rehab   Date  05/02/15   Educator  LB   Instruction Review Code  2- meets goals/outcomes      Anatomy and Physiology of the Lungs: - Group verbal and written instruction with the use of models to provide basic lung anatomy and physiology related to function, structure and complications of lung disease.   Heart Failure: - Group verbal and written instruction on the basics of heart failure: signs/symptoms, treatments, explanation of ejection fraction, enlarged heart and cardiomyopathy.   Sleep Apnea: - Individual verbal and written instruction to review Obstructive Sleep Apnea. Review of risk factors, methods for diagnosing and types of masks and machines for OSA.   Anxiety: - Provides group, verbal and written instruction on the correlation between heart/lung disease and anxiety, treatment options, and management of anxiety.   Relaxation: - Provides group, verbal and written instruction about the benefits of relaxation for patients with heart/lung disease. Also provides patients with examples of relaxation techniques.   Knowledge Questionnaire Score:     Knowledge Questionnaire Score - 05/02/15 1330     Knowledge Questionnaire Score   Pre Score 10/10       Core Components/Risk Factors/Patient Goals at Admission:     Personal Goals and Risk Factors at Admission - 05/02/15 1330    Core Components/Risk Factors/Patient Goals on Admission    Weight Management Yes;Obesity   Intervention Weight Management: Develop a combined nutrition and exercise program designed to reach desired caloric intake, while maintaining appropriate intake of nutrient and fiber, sodium and fats, and appropriate energy expenditure required for the weight goal.;Weight Management: Provide education and appropriate resources to help participant work on and attain dietary goals.;Weight Management/Obesity: Establish reasonable short term and long term weight goals.;Obesity: Provide education and appropriate resources to help participant work on and attain dietary goals.   Admit Weight 345 lb 3.2 oz (156.582 kg)   Goal Weight: Short Term 300 lb (136.079 kg)   Goal Weight: Long Term 220 lb (99.791 kg)   Expected Outcomes Short Term: Continue to assess and modify interventions until short term weight is achieved.;Long Term: Adherence to nutrition and physical activity/exercise program aimed toward attainment of established weight goal.   Sedentary Yes  Ms  Qiao likes to walk at [employer].   Intervention Provide advice, education, support and counseling about physical activity/exercise needs.;Develop an individualized exercise prescription for aerobic and resistive training based on initial evaluation findings, risk stratification, comorbidities and participant's personal goals.   Expected Outcomes Achievement of increased cardiorespiratory fitness and enhanced flexibility, muscular endurance and strength shown through measurements of functional capaciy and personal statement of participant.   Improve shortness of breath with ADL's Yes  Ms Senft had a high score on her SOB questionnaire and does have fear of shortness of breath.    Intervention Provide education, individualized exercise plan and daily activity instruction to help decrease symptoms of SOB with activities of daily living.   Expected Outcomes Short Term: Achieves a reduction of symptoms when performing activities of daily living.   Develop more efficient breathing techniques such as purse lipped breathing and diaphragmatic breathing; and practicing self-pacing with activity Yes   Intervention Provide education, demonstration and support about specific breathing techniuqes utilized for more efficient breathing. Include techniques such as pursed lipped breathing, diaphragmatic breathing and self-pacing activity.   Expected Outcomes Short Term: Participant will be able to demonstrate and use breathing techniques as needed throughout daily activities.   Increase knowledge of respiratory medications and ability to use respiratory devices properly  Yes   Intervention Provide education and demonstration as needed of appropriate use of medications, inhalers, and oxygen therapy.  Ms Nemechek uses Duoneb and Pulmicort SVN; and ProAir MDI; spacer was given with instructions.   Expected Outcomes Short Term: Achieves understanding of medications use. Understands that oxygen is a medication prescribed by physician. Demonstrates appropriate use of inhaler and oxygen therapy.   Diabetes Yes   Intervention Provide education about signs/symptoms and action to take for hypo/hyperglycemia.;Provide education about proper nutrition, including hydration, and aerobic/resistive exercise prescription along with prescribed medications to achieve blood glucose in normal ranges: Fasting glucose 65-99 mg/dL   Expected Outcomes Short Term: Participant verbalizes understanding of the signs/symptoms and immediate care of hyper/hypoglycemia, proper foot care and importance of medication, aerobic/resistive exercise and nutrition plan for blood glucose control.;Long Term: Attainment of HbA1C < 7%.    Hypertension Yes   Intervention Provide education on lifestyle modifcations including regular physical activity/exercise, weight management, moderate sodium restriction and increased consumption of fresh fruit, vegetables, and low fat dairy, alcohol moderation, and smoking cessation.;Monitor prescription use compliance.   Expected Outcomes Short Term: Continued assessment and intervention until BP is < 140/73mm HG in hypertensive participants. < 130/69mm HG in hypertensive participants with diabetes, heart failure or chronic kidney disease.;Long Term: Maintenance of blood pressure at goal levels.      Core Components/Risk Factors/Patient Goals Review:    Core Components/Risk Factors/Patient Goals at Discharge (Final Review):    ITP Comments:     ITP Comments      07/11/15 0716           ITP Comments Called Ms Ermel 07/10/15. She has not attended since her initial evaluation on 04/29/15. Ms Alvy Bimler does plan to start on 07/12/15.          Comments: 30 day note review

## 2015-08-09 ENCOUNTER — Encounter: Payer: Self-pay | Admitting: *Deleted

## 2015-08-09 ENCOUNTER — Emergency Department: Payer: Medicaid Other

## 2015-08-09 ENCOUNTER — Inpatient Hospital Stay
Admission: EM | Admit: 2015-08-09 | Discharge: 2015-08-13 | DRG: 291 | Disposition: A | Discharge status: Home / Self Care | Payer: Medicaid Other | Attending: Internal Medicine | Admitting: Internal Medicine

## 2015-08-09 DIAGNOSIS — I5033 Acute on chronic diastolic (congestive) heart failure: Principal | ICD-10-CM | POA: Diagnosis present

## 2015-08-09 DIAGNOSIS — E871 Hypo-osmolality and hyponatremia: Secondary | ICD-10-CM | POA: Diagnosis present

## 2015-08-09 DIAGNOSIS — Z7982 Long term (current) use of aspirin: Secondary | ICD-10-CM

## 2015-08-09 DIAGNOSIS — Z833 Family history of diabetes mellitus: Secondary | ICD-10-CM

## 2015-08-09 DIAGNOSIS — I110 Hypertensive heart disease with heart failure: Secondary | ICD-10-CM | POA: Diagnosis present

## 2015-08-09 DIAGNOSIS — N179 Acute kidney failure, unspecified: Secondary | ICD-10-CM | POA: Diagnosis present

## 2015-08-09 DIAGNOSIS — J45909 Unspecified asthma, uncomplicated: Secondary | ICD-10-CM | POA: Diagnosis present

## 2015-08-09 DIAGNOSIS — Z79899 Other long term (current) drug therapy: Secondary | ICD-10-CM | POA: Diagnosis not present

## 2015-08-09 DIAGNOSIS — J189 Pneumonia, unspecified organism: Secondary | ICD-10-CM | POA: Diagnosis present

## 2015-08-09 DIAGNOSIS — R0689 Other abnormalities of breathing: Secondary | ICD-10-CM | POA: Diagnosis present

## 2015-08-09 DIAGNOSIS — Z7951 Long term (current) use of inhaled steroids: Secondary | ICD-10-CM

## 2015-08-09 DIAGNOSIS — J209 Acute bronchitis, unspecified: Secondary | ICD-10-CM | POA: Diagnosis present

## 2015-08-09 DIAGNOSIS — Z794 Long term (current) use of insulin: Secondary | ICD-10-CM

## 2015-08-09 DIAGNOSIS — J9601 Acute respiratory failure with hypoxia: Secondary | ICD-10-CM | POA: Diagnosis present

## 2015-08-09 DIAGNOSIS — Z6841 Body Mass Index (BMI) 40.0 and over, adult: Secondary | ICD-10-CM

## 2015-08-09 DIAGNOSIS — E1165 Type 2 diabetes mellitus with hyperglycemia: Secondary | ICD-10-CM | POA: Diagnosis present

## 2015-08-09 DIAGNOSIS — R7989 Other specified abnormal findings of blood chemistry: Secondary | ICD-10-CM | POA: Diagnosis present

## 2015-08-09 DIAGNOSIS — N390 Urinary tract infection, site not specified: Secondary | ICD-10-CM | POA: Diagnosis present

## 2015-08-09 DIAGNOSIS — Z8249 Family history of ischemic heart disease and other diseases of the circulatory system: Secondary | ICD-10-CM | POA: Diagnosis not present

## 2015-08-09 DIAGNOSIS — A419 Sepsis, unspecified organism: Secondary | ICD-10-CM

## 2015-08-09 DIAGNOSIS — R778 Other specified abnormalities of plasma proteins: Secondary | ICD-10-CM

## 2015-08-09 DIAGNOSIS — R0902 Hypoxemia: Secondary | ICD-10-CM

## 2015-08-09 DIAGNOSIS — N289 Disorder of kidney and ureter, unspecified: Secondary | ICD-10-CM

## 2015-08-09 LAB — CBC WITH DIFFERENTIAL/PLATELET
BASOS PCT: 1 %
Basophils Absolute: 0.1 10*3/uL (ref 0–0.1)
Eosinophils Absolute: 0 10*3/uL (ref 0–0.7)
Eosinophils Relative: 1 %
HEMATOCRIT: 37.2 % (ref 35.0–47.0)
HEMOGLOBIN: 11.8 g/dL — AB (ref 12.0–16.0)
LYMPHS ABS: 0.9 10*3/uL — AB (ref 1.0–3.6)
Lymphocytes Relative: 13 %
MCH: 26.3 pg (ref 26.0–34.0)
MCHC: 31.8 g/dL — AB (ref 32.0–36.0)
MCV: 82.7 fL (ref 80.0–100.0)
MONOS PCT: 10 %
Monocytes Absolute: 0.7 10*3/uL (ref 0.2–0.9)
NEUTROS ABS: 5.1 10*3/uL (ref 1.4–6.5)
NEUTROS PCT: 75 %
Platelets: 289 10*3/uL (ref 150–440)
RBC: 4.5 MIL/uL (ref 3.80–5.20)
RDW: 16.7 % — ABNORMAL HIGH (ref 11.5–14.5)
WBC: 6.8 10*3/uL (ref 3.6–11.0)

## 2015-08-09 LAB — COMPREHENSIVE METABOLIC PANEL
ALT: 19 U/L (ref 14–54)
ANION GAP: 9 (ref 5–15)
AST: 30 U/L (ref 15–41)
Albumin: 2.3 g/dL — ABNORMAL LOW (ref 3.5–5.0)
Alkaline Phosphatase: 138 U/L — ABNORMAL HIGH (ref 38–126)
BUN: 19 mg/dL (ref 6–20)
CHLORIDE: 95 mmol/L — AB (ref 101–111)
CO2: 29 mmol/L (ref 22–32)
Calcium: 7.3 mg/dL — ABNORMAL LOW (ref 8.9–10.3)
Creatinine, Ser: 1.39 mg/dL — ABNORMAL HIGH (ref 0.44–1.00)
GFR, EST AFRICAN AMERICAN: 53 mL/min — AB (ref >60)
GFR, EST NON AFRICAN AMERICAN: 45 mL/min — AB (ref >60)
Glucose, Bld: 309 mg/dL — ABNORMAL HIGH (ref 65–99)
Potassium: 3.5 mmol/L (ref 3.5–5.1)
SODIUM: 133 mmol/L — AB (ref 135–145)
Total Bilirubin: 0.3 mg/dL (ref 0.3–1.2)
Total Protein: 5.6 g/dL — ABNORMAL LOW (ref 6.5–8.1)

## 2015-08-09 LAB — CBC
HEMATOCRIT: 37.3 % (ref 35.0–47.0)
Hemoglobin: 12 g/dL (ref 12.0–16.0)
MCH: 26.4 pg (ref 26.0–34.0)
MCHC: 32 g/dL (ref 32.0–36.0)
MCV: 82.5 fL (ref 80.0–100.0)
PLATELETS: 292 10*3/uL (ref 150–440)
RBC: 4.52 MIL/uL (ref 3.80–5.20)
RDW: 16.5 % — AB (ref 11.5–14.5)
WBC: 5.9 10*3/uL (ref 3.6–11.0)

## 2015-08-09 LAB — TROPONIN I
TROPONIN I: 0.05 ng/mL — AB (ref <0.031)
TROPONIN I: 0.05 ng/mL — AB (ref <0.031)
Troponin I: 0.06 ng/mL — ABNORMAL HIGH (ref <0.031)

## 2015-08-09 LAB — BLOOD GAS, VENOUS
Acid-Base Excess: 8.9 mmol/L — ABNORMAL HIGH (ref 0.0–3.0)
BICARBONATE: 36.4 meq/L — AB (ref 21.0–28.0)
FIO2: 28
O2 Saturation: 80.3 %
PATIENT TEMPERATURE: 37
PH VEN: 7.37 (ref 7.320–7.430)
pCO2, Ven: 63 mmHg — ABNORMAL HIGH (ref 44.0–60.0)
pO2, Ven: 46 mmHg — ABNORMAL HIGH (ref 31.0–45.0)

## 2015-08-09 LAB — TSH: TSH: 3.193 u[IU]/mL (ref 0.350–4.500)

## 2015-08-09 LAB — CREATININE, SERUM
Creatinine, Ser: 1.65 mg/dL — ABNORMAL HIGH (ref 0.44–1.00)
GFR calc Af Amer: 43 mL/min — ABNORMAL LOW (ref >60)
GFR, EST NON AFRICAN AMERICAN: 37 mL/min — AB (ref >60)

## 2015-08-09 LAB — LACTIC ACID, PLASMA
LACTIC ACID, VENOUS: 0.8 mmol/L (ref 0.5–2.0)
Lactic Acid, Venous: 0.7 mmol/L (ref 0.5–2.0)

## 2015-08-09 LAB — BRAIN NATRIURETIC PEPTIDE: B NATRIURETIC PEPTIDE 5: 148 pg/mL — AB (ref 0.0–100.0)

## 2015-08-09 LAB — GLUCOSE, CAPILLARY
GLUCOSE-CAPILLARY: 252 mg/dL — AB (ref 65–99)
GLUCOSE-CAPILLARY: 187 mg/dL — AB (ref 65–99)
Glucose-Capillary: 163 mg/dL — ABNORMAL HIGH (ref 65–99)

## 2015-08-09 IMAGING — CR DG CHEST 2V
1 series · 2 of 2 positions shown · non-contrast
Comparison: [DATE]

CLINICAL DATA: Shortness of broad for 6 months, worsening shortness
of breath last 2 days, productive cough for 2 days, history of
asthma

EXAM:
CHEST  2 VIEW

[Series 1: dg chest 2 view · 0.14mm/px · 2 of 2 slices shown]
[im 1/2]
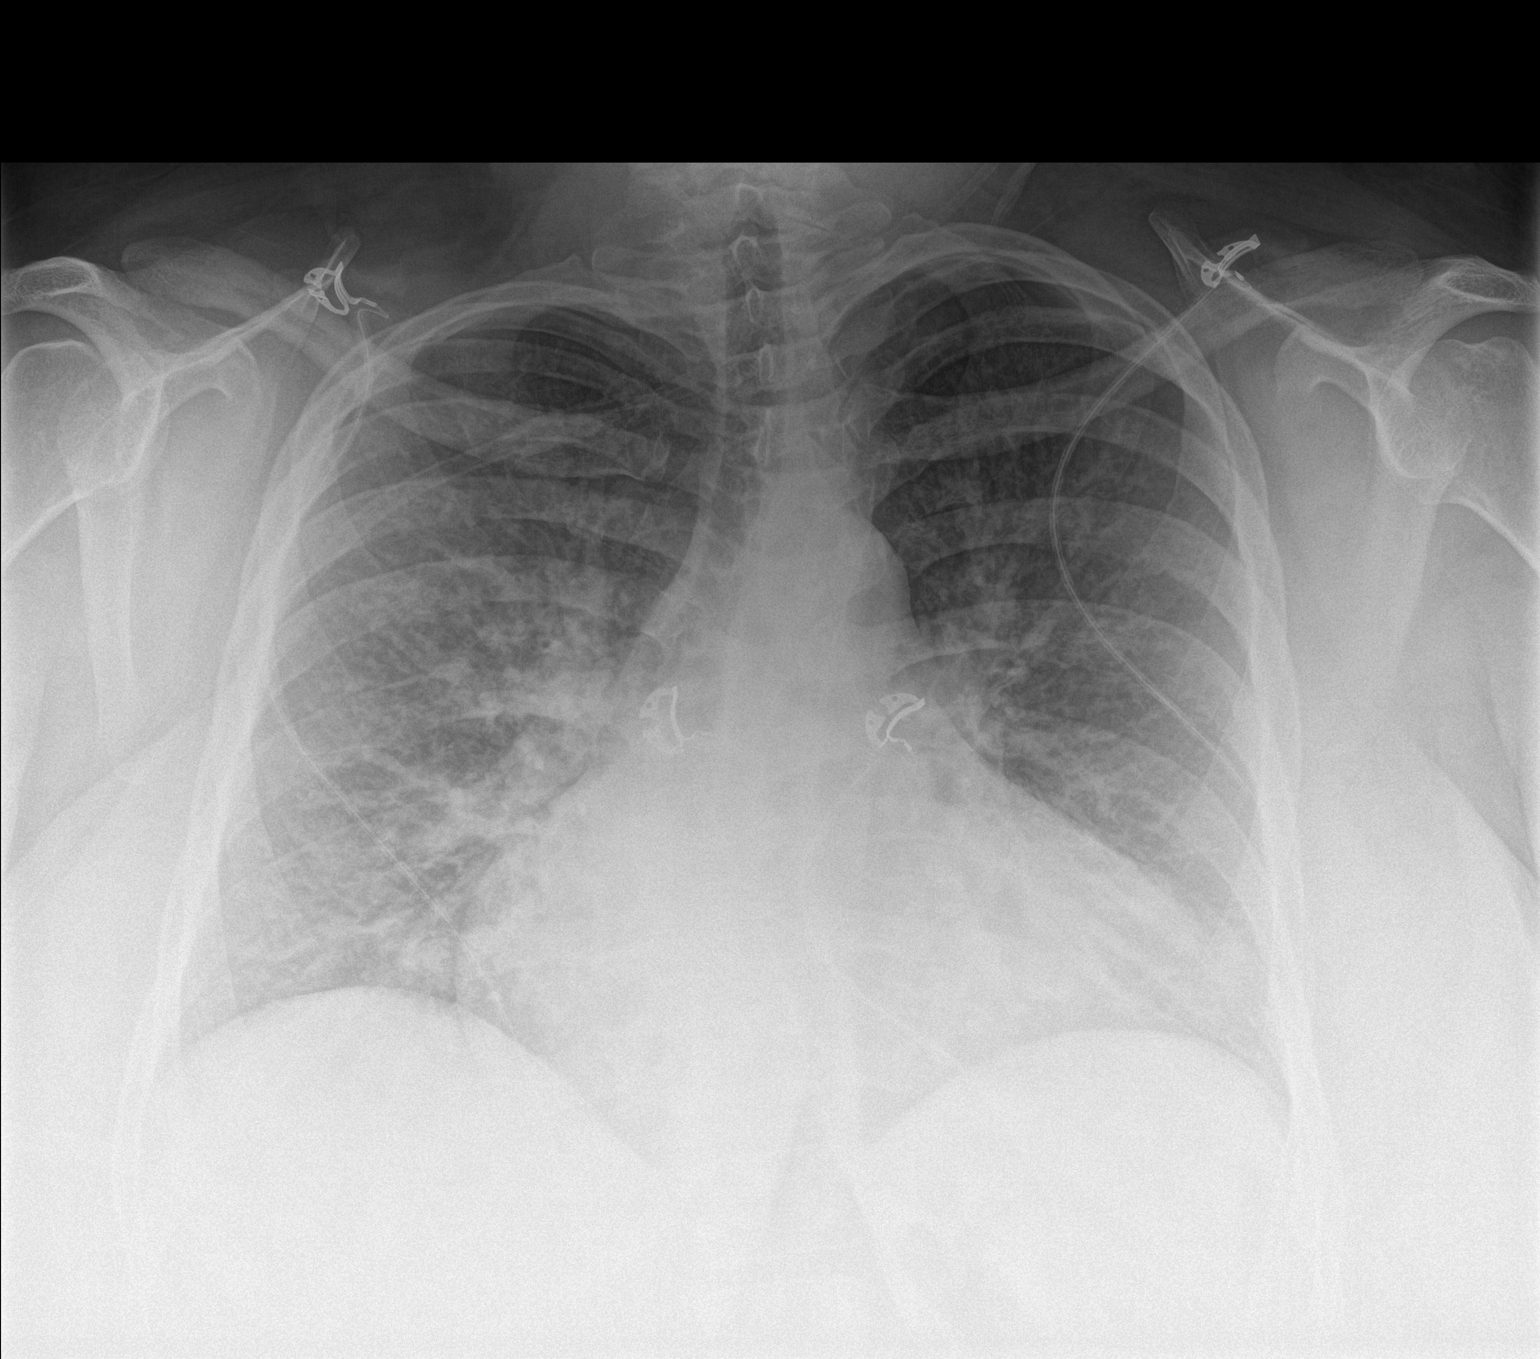
[im 2/2]
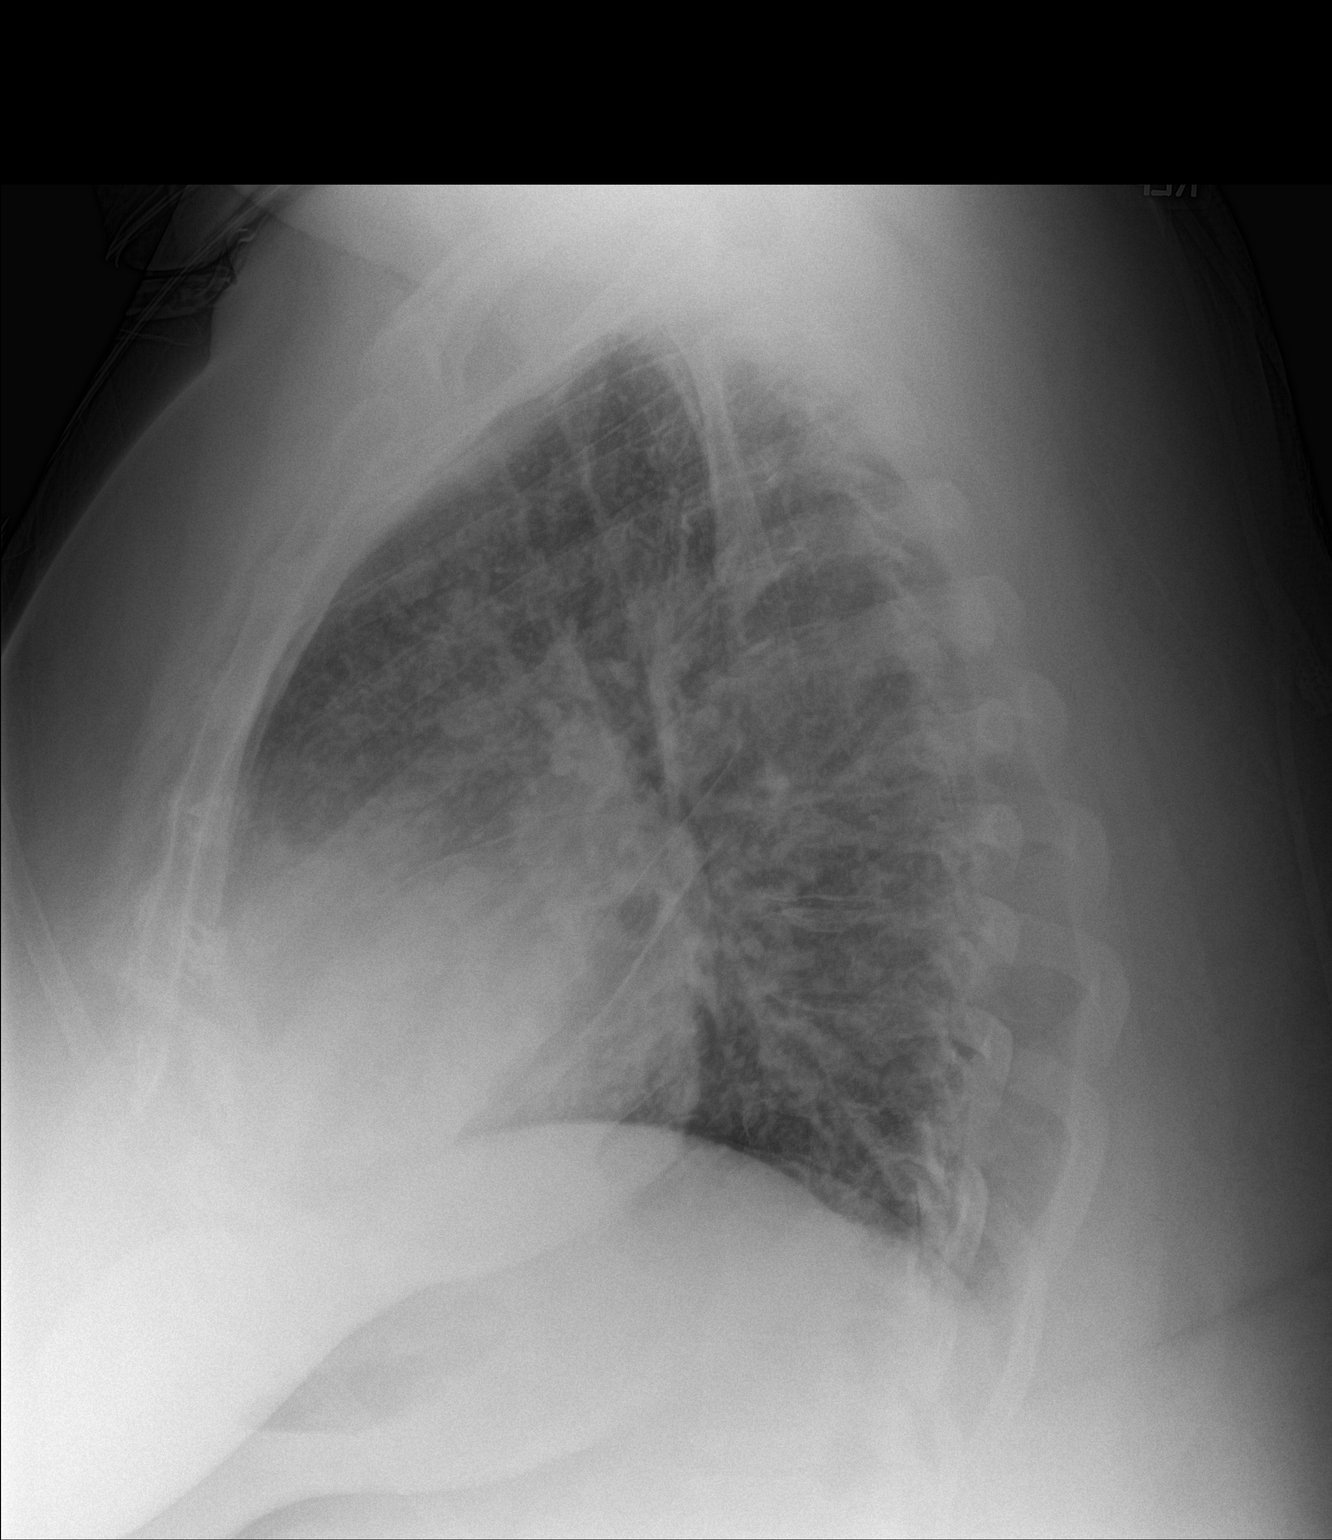

[2 of 2 positions shown; findings below may reference images not displayed]

FINDINGS: Cardiomegaly again noted. Mild perihilar bronchitic changes. Mild
perihilar interstitial prominence suspicious for mild interstitial
edema or pneumonitis.
IMPRESSION: Mild perihilar bronchitic changes. Mild perihilar interstitial
prominence suspicious for mild interstitial edema or pneumonitis.
Cardiomegaly again noted.

## 2015-08-09 MED ORDER — BUDESONIDE 0.25 MG/2ML IN SUSP
0.2500 mg | Freq: Two times a day (BID) | RESPIRATORY_TRACT | Status: DC
  Administered 2015-08-09 – 2015-08-10 (×3): 0.25 mg via RESPIRATORY_TRACT
  Filled 2015-08-09 (×3): qty 2

## 2015-08-09 MED ORDER — DEXTROSE 5 % IV SOLN
500.0000 mg | Freq: Once | INTRAVENOUS | Status: AC
  Administered 2015-08-09: 500 mg via INTRAVENOUS
  Filled 2015-08-09: qty 500

## 2015-08-09 MED ORDER — INSULIN ASPART 100 UNIT/ML ~~LOC~~ SOLN
10.0000 [IU] | Freq: Once | SUBCUTANEOUS | Status: AC
  Administered 2015-08-09: 10 [IU] via SUBCUTANEOUS
  Filled 2015-08-09: qty 10

## 2015-08-09 MED ORDER — POTASSIUM CHLORIDE CRYS ER 10 MEQ PO TBCR
10.0000 meq | EXTENDED_RELEASE_TABLET | Freq: Two times a day (BID) | ORAL | Status: DC
  Administered 2015-08-09 – 2015-08-13 (×8): 10 meq via ORAL
  Filled 2015-08-09 (×8): qty 1

## 2015-08-09 MED ORDER — HYDRALAZINE HCL 50 MG PO TABS
100.0000 mg | ORAL_TABLET | Freq: Three times a day (TID) | ORAL | Status: DC
  Administered 2015-08-09 – 2015-08-13 (×11): 100 mg via ORAL
  Filled 2015-08-09 (×5): qty 2
  Filled 2015-08-09: qty 1
  Filled 2015-08-09 (×6): qty 2

## 2015-08-09 MED ORDER — LEVOFLOXACIN IN D5W 750 MG/150ML IV SOLN: 750.0000 mg | INTRAVENOUS | Status: DC

## 2015-08-09 MED ORDER — IPRATROPIUM-ALBUTEROL 0.5-2.5 (3) MG/3ML IN SOLN
3.0000 mL | Freq: Four times a day (QID) | RESPIRATORY_TRACT | Status: DC | PRN
  Administered 2015-08-09 – 2015-08-13 (×13): 3 mL via RESPIRATORY_TRACT
  Filled 2015-08-09 (×13): qty 3

## 2015-08-09 MED ORDER — AMLODIPINE BESYLATE 10 MG PO TABS
10.0000 mg | ORAL_TABLET | Freq: Every day | ORAL | Status: DC
  Administered 2015-08-10 – 2015-08-13 (×4): 10 mg via ORAL
  Filled 2015-08-09 (×4): qty 1

## 2015-08-09 MED ORDER — SODIUM CHLORIDE 0.9% FLUSH
3.0000 mL | Freq: Two times a day (BID) | INTRAVENOUS | Status: DC
  Administered 2015-08-10 – 2015-08-13 (×6): 3 mL via INTRAVENOUS

## 2015-08-09 MED ORDER — BISACODYL 5 MG PO TBEC: 5.0000 mg | DELAYED_RELEASE_TABLET | Freq: Every day | ORAL | Status: DC | PRN

## 2015-08-09 MED ORDER — ACETAMINOPHEN 650 MG RE SUPP: 650.0000 mg | Freq: Four times a day (QID) | RECTAL | Status: DC | PRN

## 2015-08-09 MED ORDER — METOPROLOL TARTRATE 25 MG PO TABS
75.0000 mg | ORAL_TABLET | Freq: Two times a day (BID) | ORAL | Status: DC
  Administered 2015-08-09 – 2015-08-13 (×8): 75 mg via ORAL
  Filled 2015-08-09 (×9): qty 3

## 2015-08-09 MED ORDER — INSULIN GLARGINE 100 UNIT/ML ~~LOC~~ SOLN
38.0000 [IU] | Freq: Every day | SUBCUTANEOUS | Status: DC
  Administered 2015-08-09: 38 [IU] via SUBCUTANEOUS
  Filled 2015-08-09 (×2): qty 0.38

## 2015-08-09 MED ORDER — SODIUM CHLORIDE 0.9 % IV SOLN: 250.0000 mL | INTRAVENOUS | Status: DC | PRN

## 2015-08-09 MED ORDER — ACETAMINOPHEN 325 MG PO TABS
650.0000 mg | ORAL_TABLET | Freq: Four times a day (QID) | ORAL | Status: DC | PRN
  Administered 2015-08-11 – 2015-08-13 (×5): 650 mg via ORAL
  Filled 2015-08-09 (×5): qty 2

## 2015-08-09 MED ORDER — LOSARTAN POTASSIUM 50 MG PO TABS
50.0000 mg | ORAL_TABLET | Freq: Every day | ORAL | Status: DC
  Administered 2015-08-10: 50 mg via ORAL
  Filled 2015-08-09: qty 1

## 2015-08-09 MED ORDER — ALBUTEROL SULFATE (2.5 MG/3ML) 0.083% IN NEBU
2.5000 mg | INHALATION_SOLUTION | Freq: Four times a day (QID) | RESPIRATORY_TRACT | Status: DC | PRN
  Administered 2015-08-11 – 2015-08-13 (×3): 2.5 mg via RESPIRATORY_TRACT
  Filled 2015-08-09 (×3): qty 3

## 2015-08-09 MED ORDER — SODIUM CHLORIDE 0.9 % IV BOLUS (SEPSIS)
500.0000 mL | Freq: Once | INTRAVENOUS | Status: AC
  Administered 2015-08-09: 500 mL via INTRAVENOUS

## 2015-08-09 MED ORDER — CLONAZEPAM 1 MG PO TABS: 1.0000 mg | ORAL_TABLET | Freq: Every evening | ORAL | Status: DC | PRN

## 2015-08-09 MED ORDER — HYDROCODONE-ACETAMINOPHEN 5-325 MG PO TABS
1.0000 | ORAL_TABLET | ORAL | Status: DC | PRN
  Administered 2015-08-09 (×2): 1 via ORAL
  Administered 2015-08-10: 2 via ORAL
  Administered 2015-08-10 – 2015-08-11 (×2): 1 via ORAL
  Administered 2015-08-12: 2 via ORAL
  Filled 2015-08-09 (×4): qty 1
  Filled 2015-08-09 (×2): qty 2

## 2015-08-09 MED ORDER — FUROSEMIDE 10 MG/ML IJ SOLN
40.0000 mg | Freq: Two times a day (BID) | INTRAMUSCULAR | Status: DC
  Administered 2015-08-09: 40 mg via INTRAVENOUS
  Filled 2015-08-09: qty 4

## 2015-08-09 MED ORDER — SIMVASTATIN 40 MG PO TABS
40.0000 mg | ORAL_TABLET | Freq: Every day | ORAL | Status: DC
  Administered 2015-08-09: 40 mg via ORAL
  Filled 2015-08-09: qty 1

## 2015-08-09 MED ORDER — SODIUM CHLORIDE 0.9% FLUSH: 3.0000 mL | INTRAVENOUS | Status: DC | PRN

## 2015-08-09 MED ORDER — ALBUTEROL SULFATE HFA 108 (90 BASE) MCG/ACT IN AERS: 2.0000 | INHALATION_SPRAY | Freq: Four times a day (QID) | RESPIRATORY_TRACT | Status: DC | PRN

## 2015-08-09 MED ORDER — INSULIN ASPART 100 UNIT/ML FLEXPEN: 0.0000 [IU] | PEN_INJECTOR | Freq: Three times a day (TID) | SUBCUTANEOUS | Status: DC | PRN

## 2015-08-09 MED ORDER — ASPIRIN EC 81 MG PO TBEC
81.0000 mg | DELAYED_RELEASE_TABLET | Freq: Every day | ORAL | Status: DC
  Administered 2015-08-09 – 2015-08-12 (×4): 81 mg via ORAL
  Filled 2015-08-09 (×4): qty 1

## 2015-08-09 MED ORDER — ACETAMINOPHEN 500 MG PO TABS
1000.0000 mg | ORAL_TABLET | Freq: Once | ORAL | Status: AC
Start: 2015-08-09 — End: 2015-08-09
  Administered 2015-08-09: 1000 mg via ORAL
  Filled 2015-08-09: qty 2

## 2015-08-09 MED ORDER — INSULIN ASPART 100 UNIT/ML ~~LOC~~ SOLN
0.0000 [IU] | Freq: Every day | SUBCUTANEOUS | Status: DC
  Administered 2015-08-10 – 2015-08-12 (×2): 2 [IU] via SUBCUTANEOUS
  Filled 2015-08-09: qty 2

## 2015-08-09 MED ORDER — ONDANSETRON HCL 4 MG PO TABS: 4.0000 mg | ORAL_TABLET | Freq: Four times a day (QID) | ORAL | Status: DC | PRN

## 2015-08-09 MED ORDER — LEVOFLOXACIN IN D5W 750 MG/150ML IV SOLN
750.0000 mg | INTRAVENOUS | Status: DC
  Filled 2015-08-09: qty 150

## 2015-08-09 MED ORDER — HEPARIN SODIUM (PORCINE) 5000 UNIT/ML IJ SOLN
5000.0000 [IU] | Freq: Three times a day (TID) | INTRAMUSCULAR | Status: DC
  Administered 2015-08-09 – 2015-08-13 (×11): 5000 [IU] via SUBCUTANEOUS
  Filled 2015-08-09 (×11): qty 1

## 2015-08-09 MED ORDER — SODIUM CHLORIDE 0.9% FLUSH
3.0000 mL | Freq: Two times a day (BID) | INTRAVENOUS | Status: DC
  Administered 2015-08-09: 3 mL via INTRAVENOUS

## 2015-08-09 MED ORDER — INSULIN DEGLUDEC 100 UNIT/ML ~~LOC~~ SOPN: 38.0000 [IU] | PEN_INJECTOR | Freq: Every day | SUBCUTANEOUS | Status: DC

## 2015-08-09 MED ORDER — DEXTROSE 5 % IV SOLN
1.0000 g | Freq: Once | INTRAVENOUS | Status: AC
  Administered 2015-08-09: 1 g via INTRAVENOUS
  Filled 2015-08-09: qty 10

## 2015-08-09 MED ORDER — INSULIN ASPART 100 UNIT/ML ~~LOC~~ SOLN
0.0000 [IU] | Freq: Three times a day (TID) | SUBCUTANEOUS | Status: DC
  Administered 2015-08-09: 4 [IU] via SUBCUTANEOUS
  Administered 2015-08-10 (×2): 7 [IU] via SUBCUTANEOUS
  Administered 2015-08-10 – 2015-08-11 (×2): 4 [IU] via SUBCUTANEOUS
  Administered 2015-08-11: 3 [IU] via SUBCUTANEOUS
  Administered 2015-08-11: 7 [IU] via SUBCUTANEOUS
  Administered 2015-08-13: 3 [IU] via SUBCUTANEOUS
  Administered 2015-08-13: 4 [IU] via SUBCUTANEOUS
  Filled 2015-08-09: qty 3
  Filled 2015-08-09: qty 4
  Filled 2015-08-09: qty 3
  Filled 2015-08-09: qty 7
  Filled 2015-08-09 (×3): qty 4
  Filled 2015-08-09 (×2): qty 7
  Filled 2015-08-09: qty 3

## 2015-08-09 MED ORDER — ONDANSETRON HCL 4 MG/2ML IJ SOLN
4.0000 mg | Freq: Four times a day (QID) | INTRAMUSCULAR | Status: DC | PRN
  Administered 2015-08-12: 4 mg via INTRAVENOUS
  Filled 2015-08-09: qty 2

## 2015-08-09 MED ORDER — ACETAMINOPHEN 500 MG PO TABS: 1000.0000 mg | ORAL_TABLET | Freq: Four times a day (QID) | ORAL | Status: DC | PRN

## 2015-08-09 MED ORDER — SENNOSIDES-DOCUSATE SODIUM 8.6-50 MG PO TABS: 1.0000 | ORAL_TABLET | Freq: Every evening | ORAL | Status: DC | PRN

## 2015-08-09 MED ORDER — CITALOPRAM HYDROBROMIDE 20 MG PO TABS
40.0000 mg | ORAL_TABLET | Freq: Every day | ORAL | Status: DC
  Administered 2015-08-10 – 2015-08-13 (×4): 40 mg via ORAL
  Filled 2015-08-09 (×4): qty 2

## 2015-08-09 NOTE — Progress Notes (Signed)
Pharmacy Antibiotic Note  [name] is a 45 y.o. female admitted on 08/09/2015 with pneumonia.  Pharmacy has been consulted for levofloxacin dosing.  Plan: Levofloxacin 750 mg IV q24h (beginning 24 hours after azithromycin dose)  Height: 5\' 4"  (162.6 cm) Weight: (!) 340 lb 14.4 oz (154.631 kg) IBW/kg (Calculated) : 54.7  Temp (24hrs), Avg:99.5 F (37.5 C), Min:99 F (37.2 C), Max:100 F (37.8 C)   Recent Labs Lab 08/09/15 1355 08/09/15 1406  WBC 6.8  --   CREATININE 1.39*  --   LATICACIDVEN  --  0.7    Estimated Creatinine Clearance: 77.2 mL/min (by C-G formula based on Cr of 1.39).    Allergies  Allergen Reactions  . Feraheme [Ferumoxytol] Itching  . Sulfa Antibiotics Itching   Antimicrobials this admission: levofloxacin 6/14 >>  CTX and azithromycin 6/14 doses in ED  Dose adjustments this admission:  Microbiology results: 6/14 BCx: Sent 6/14 UCx: Sent   Thank you for allowing pharmacy to be a part of this patient's care.  Lenis Noon, PharmD Clinical Pharmacist 08/09/2015 4:41 PM

## 2015-08-09 NOTE — ED Notes (Signed)
Called code sepsis to [facility] at [phone]

## 2015-08-09 NOTE — ED Provider Notes (Addendum)
[facility] Emergency Department Provider Note  ____________________________________________  Time seen: Approximately 1:44 PM  I have reviewed the triage vital signs and the nursing notes.   HISTORY  Chief Complaint Shortness of Breath and Leg Swelling    HPI [name] is a 45 y.o. female with a history of DM 2, CHF,HTN, pericardial effusion, presenting with cough, shortness of breath and fever.. Patient reports that she has been having exertional shortness of breath which is worse than baseline for the past several days. She has had an associated subjective fever, congestion with a cough that is "sticky" and occasionally productive. No ear pain.  Positive scratchy throat. No chest pain, pressure or tightness. The patient weighs herself every 2-3 days and has not noticed any weight gain. She has not noticed any increased swelling in her lower extremities. Positive sick contact with a granddaughter that had URI symptoms prior to onset of symptoms.   Past Medical History  Diagnosis Date  . Poorly controlled diabetes mellitus ([place])     a. 11/2014 A1c 13.2.  . Iron deficiency anemia   . Asthma   . Hypertension   . Pericardial effusion     a. 11/2014 CT Chest: Mod effusion.  . Diabetes mellitus without complication ([place])   . CHF (congestive heart failure) [facility])     Patient Active Problem List   Diagnosis Date Noted  . Poorly controlled type 2 diabetes mellitus ([place]) 03/13/2015  . Iron deficiency anemia 02/17/2015  . Morbid obesity due to excess calories ([place])   . Shortness of breath   . Essential hypertension   . CHF (congestive heart failure) ([place]) 01/01/2015  . Acute on chronic diastolic heart failure ([place]) 12/31/2014  . Hypertensive heart disease with congestive heart failure ([place]) 12/18/2014  . Pericardial effusion 12/15/2014  . SOB (shortness of breath) 12/15/2014  . Accelerated hypertension with diastolic congestive heart failure, NYHA class  3 ([place]) 12/15/2014  . Diabetes ([place]) 12/15/2014    Past Surgical History  Procedure Laterality Date  . Cesarean section      Current Outpatient Rx  Name  Route  Sig  Dispense  Refill  . acetaminophen (TYLENOL) 500 MG tablet   Oral   Take 1,000 mg by mouth every 6 (six) hours as needed for mild pain, fever or headache.         . albuterol (PROVENTIL HFA;VENTOLIN HFA) 108 (90 BASE) MCG/ACT inhaler   Inhalation   Inhale 2 puffs into the lungs every 6 (six) hours as needed for wheezing or shortness of breath.   1 Inhaler   0   . amLODipine (NORVASC) 10 MG tablet   Oral   Take 1 tablet (10 mg total) by mouth daily.   30 tablet   0   . aspirin EC 81 MG tablet   Oral   Take 81 mg by mouth at bedtime.         . budesonide (PULMICORT) 0.25 MG/2ML nebulizer solution   Nebulization   Take 2 mLs (0.25 mg total) by nebulization 2 (two) times daily.   60 mL   12   . citalopram (CELEXA) 40 MG tablet   Oral   Take 40 mg by mouth daily.         . clonazePAM (KLONOPIN) 2 MG tablet   Oral   Take 1 mg by mouth at bedtime as needed (for sleep).          . hydrALAZINE (APRESOLINE) 100 MG tablet  Oral   Take 1 tablet (100 mg total) by mouth every 8 (eight) hours.   60 tablet   0   . insulin aspart (NOVOLOG FLEXPEN) 100 UNIT/ML FlexPen   Subcutaneous   Inject 0-12 Units into the skin 3 (three) times daily with meals as needed for high blood sugar. Pt uses as needed per sliding scale.         . Insulin Degludec (TRESIBA FLEXTOUCH) 100 UNIT/ML SOPN   Subcutaneous   Inject 38 Units into the skin at bedtime.         [name] ipratropium-albuterol (DUONEB) 0.5-2.5 (3) MG/3ML SOLN   Nebulization   Take 3 mLs by nebulization every 6 (six) hours as needed (for wheezing/shortness of breath).         . losartan (COZAAR) 50 MG tablet   Oral   Take 1 tablet (50 mg total) by mouth daily.   30 tablet   0   . Metoprolol Tartrate 75 MG TABS   Oral   Take 75 mg by mouth 2 (two)  times daily.   60 tablet   0   . potassium chloride (K-DUR,KLOR-CON) 10 MEQ tablet   Oral   Take 1 tablet (10 mEq total) by mouth 2 (two) times daily.   30 tablet   0   . simvastatin (ZOCOR) 20 MG tablet   Oral   Take 20 mg by mouth at bedtime.         . torsemide (DEMADEX) 20 MG tablet   Oral   Take 40 mg by mouth 2 (two) times daily as needed (for edema).           Allergies Feraheme and Sulfa antibiotics  Family History  Problem Relation Age of Onset  . Hypertension    . Diabetes    . Diabetes Mother   . Hypertension Mother   . Hypertension Father   . Diabetes Father     Social History Social History  Substance Use Topics  . Smoking status: Never Smoker   . Smokeless tobacco: None  . Alcohol Use: Yes    Review of Systems Constitutional: Positive fever without chills. No lightheadedness or syncope. Eyes: No visual changes. No eye discharge. ENT: No sore throat. Positive congestion without rhinorrhea. Cardiovascular: Denies chest pain. Denies palpitations. Respiratory: Positive shortness of breath.  Positive cough. Gastrointestinal: No abdominal pain.  No nausea, no vomiting.  No diarrhea.  No constipation. Genitourinary: Negative for dysuria. Musculoskeletal: Negative for back pain. Skin: Negative for rash. Neurological: Negative for headaches. No focal numbness, tingling or weakness.   10-point ROS otherwise negative.  ____________________________________________   PHYSICAL EXAM:  VITAL SIGNS: ED Triage Vitals  Enc Vitals Group     BP 08/09/15 1314 117/66 mmHg     Pulse Rate 08/09/15 1314 92     Resp 08/09/15 1314 18     Temp 08/09/15 1314 100 F (37.8 C)     Temp src --      SpO2 --      Weight 08/09/15 1314 340 lb (154.223 kg)     Height 08/09/15 1314 5\' 4"  (1.626 m)     Head Cir --      Peak Flow --      Pain Score 08/09/15 1314 8     Pain Loc --      Pain Edu? --      Excl. in [place]? --     Constitutional: Alert and oriented.  Chronically ill appearing but nontoxic.[name] Answers  questions appropriately. Eyes: Conjunctivae are normal.  EOMI. No scleral icterus. No eye discharge. Head: Atraumatic. Nose: No congestion/rhinnorhea. Mouth/Throat: Mucous membranes are moist.  Neck: No stridor.  Supple.  Mild JVD. No meningismus. Cardiovascular: Fast rate, regular rhythm. No murmurs, rubs or gallops.  Respiratory: Mildly tachypnea without accessory muscle use or retractions. Rales in the left upper lung field without wheezes or rhonchi. The remainder of the lung fields are clear to auscultation. Gastrointestinal: Morbidly obese. Soft, nontender and nondistended.  No guarding or rebound.  No peritoneal signs. Musculoskeletal: Mild nonpitting symmetric lower extremity edema versus obesity around the ankles. No ttp in the calves or palpable cords.  Negative Homan's sign. Neurologic:  A&Ox3.  Speech is clear.  Face and smile are symmetric.  EOMI.  Moves all extremities well. Skin:  Skin is warm, dry and intact. No rash noted. Psychiatric: Mood and affect are normal. Speech and behavior are normal.  Normal judgement  ____________________________________________   LABS (all labs ordered are listed, but only abnormal results are displayed)  Labs Reviewed  COMPREHENSIVE METABOLIC PANEL - Abnormal; Notable for the following:    Sodium 133 (*)    Chloride 95 (*)    Glucose, Bld 309 (*)    Creatinine, Ser 1.39 (*)    Calcium 7.3 (*)    Total Protein 5.6 (*)    Albumin 2.3 (*)    Alkaline Phosphatase 138 (*)    GFR calc non Af Amer 45 (*)    GFR calc Af Amer 53 (*)    All other components within normal limits  CBC WITH DIFFERENTIAL/PLATELET - Abnormal; Notable for the following:    Hemoglobin 11.8 (*)    MCHC 31.8 (*)    RDW 16.7 (*)    Lymphs Abs 0.9 (*)    All other components within normal limits  TROPONIN I - Abnormal; Notable for the following:    Troponin I 0.05 (*)    All other components within normal limits   BLOOD GAS, VENOUS - Abnormal; Notable for the following:    pCO2, Ven 63 (*)    pO2, Ven 46.0 (*)    Bicarbonate 36.4 (*)    Acid-Base Excess 8.9 (*)    All other components within normal limits  CULTURE, BLOOD (ROUTINE X 2)  CULTURE, BLOOD (ROUTINE X 2)  URINE CULTURE  BRAIN NATRIURETIC PEPTIDE  URINALYSIS COMPLETEWITH MICROSCOPIC ([employer])   ____________________________________________  EKG  ED ECG REPORT I, Eula Listen, the attending physician, personally viewed and interpreted this ECG.   Date: 08/09/2015  EKG Time: 1232  Rate: 122  Rhythm: sinus tachycardia  Axis: Normal  Intervals:Prolonged QTC  ST&T Change: No ST elevation.  ____________________________________________  RADIOLOGY  Dg Chest 2 View  08/09/2015  CLINICAL DATA:  Shortness of broad for 6 months, worsening shortness of breath last 2 days, productive cough for 2 days, history of asthma EXAM: CHEST  2 VIEW COMPARISON:  12/31/2014 FINDINGS: Cardiomegaly again noted. Mild perihilar bronchitic changes. Mild perihilar interstitial prominence suspicious for mild interstitial edema or pneumonitis. IMPRESSION: Mild perihilar bronchitic changes. Mild perihilar interstitial prominence suspicious for mild interstitial edema or pneumonitis. Cardiomegaly again noted. Electronically Signed   By: Lahoma Crocker M.D.   On: 08/09/2015 14:49    ____________________________________________   PROCEDURES  Procedure(s) performed: None  Critical Care performed: Yes ____________________________________________   INITIAL IMPRESSION / ASSESSMENT AND PLAN / ED COURSE  Pertinent labs & imaging results that were available during my care of the patient were reviewed  by me and considered in my medical decision making (see chart for details).  45 y.o. female with multiple comorbidities as well as exposure to sick contact presenting with fever, cough, and shortness of breath. On arrival to the emergency department the  patient has an oxygen saturation of 78% and she does not wear oxygen at baseline. I repeated this at the bedside, and her O2 sats did drop to 88% at rest on room air. The patient has stable blood pressure but has a fever. Given the constellation of symptoms, I'm most concerned about pneumonia with early sepsis. Given the patient's cardiac history, acute CHF exacerbation is also possible, but she does not give a good history that would be suggestive of fluid overload. We will also evaluate for ACS or MI but again, the most likely etiology of today's symptoms is acute pneumonia. Her antibiotics will be started immediately after she is cultured, and the patient will be admitted to the hospital for further evaluation and treatment.  CRITICAL CARE Performed by: Eula Listen   Total critical care time: 35 minutes  Critical care time was exclusive of separately billable procedures and treating other patients.  Critical care was necessary to treat or prevent imminent or life-threatening deterioration.  Critical care was time spent personally by me on the following activities: development of treatment plan with patient and/or surrogate as well as nursing, discussions with consultants, evaluation of patient's response to treatment, examination of patient, obtaining history from patient or surrogate, ordering and performing treatments and interventions, ordering and review of laboratory studies, ordering and review of radiographic studies, pulse oximetry and re-evaluation of patient's condition.  ----------------------------------------- 2:57 PM on 08/09/2015 -----------------------------------------  The patient has hyperglycemia without DKA. She also has acute renal insufficiency today. Her BNP is still pending. She has started the treatment for pneumonia, and I will admit her to the hospitalist for pneumonia with hypoxia and sepsis. On nasal cannula, the patient is saturating okay in her gas does  not show hypoxemia, so BiPAP or further respiratory intervention is not required at this time. The patient has a mild bump in her troponin at 0.05, and this is likely due to demand ischemia, but will be trended by the hospitalist.  ____________________________________________  FINAL CLINICAL IMPRESSION(S) / ED DIAGNOSES  Final diagnoses:  Sepsis, due to unspecified organism ([place])  CAP (community acquired pneumonia)  Renal insufficiency  Hypercarbia  Hypoxia      NEW MEDICATIONS STARTED DURING THIS VISIT:  New Prescriptions   No medications on file     Eula Listen, MD 08/09/15 1458  Eula Listen, MD 08/09/15 1459

## 2015-08-09 NOTE — ED Notes (Signed)
Pt presents to ED with reports of cough and shortness of breath for two days. Pt SpO2 78% in triage, O2 3L via [place] applied, SpO2 up to 94%

## 2015-08-09 NOTE — ED Notes (Deleted)
Pt arrives with complaints of SOB and generalized edema, states she is on a diuretic with hx of CHF, pt SOB when speaking, pale in color, states generalized weakness, hx of a pacemaker

## 2015-08-09 NOTE — H&P (Signed)
[facility] NAME: [name]    MR#:  [id number]  DATE OF BIRTH:  [birth year]  DATE OF ADMISSION:  08/09/2015  PRIMARY CARE PHYSICIAN: Kasandra Knudsen, NP   REQUESTING/REFERRING PHYSICIAN: Dr Mariea Clonts  CHIEF COMPLAINT:  SOB, wheezing HISTORY OF PRESENT ILLNESS:  [name]  is a 45 y.o. female with a known history of Diastolic heart failure, poorly controlled diabetes and morbid obesity presents with above complaint. Patient reports for the past 3 days she's had low-grade fever, shortness of breath, wheezing and cough. She says her granddaughter who she was taking care of was also sick with similar symptoms. In the ER chest x-ray shows mild interstitial edema versus bronchitis/pneumonia. She is also found to be hypoxic with a saturation of 85% on room air.  PAST MEDICAL HISTORY:   Past Medical History  Diagnosis Date  . Poorly controlled diabetes mellitus ([place])     a. 11/2014 A1c 13.2.  . Iron deficiency anemia   . Asthma   . Hypertension   . Pericardial effusion     a. 11/2014 CT Chest: Mod effusion.  . Diabetes mellitus without complication ([place])   . CHF (congestive heart failure) ([place])     PAST SURGICAL HISTORY:   Past Surgical History  Procedure Laterality Date  . Cesarean section      SOCIAL HISTORY:   Social History  Substance Use Topics  . Smoking status: Never Smoker   . Smokeless tobacco: No  . Alcohol Use: Yes    FAMILY HISTORY:   Family History  Problem Relation Age of Onset  . Hypertension    . Diabetes    . Diabetes Mother   . Hypertension Mother   . Hypertension Father   . Diabetes Father     DRUG ALLERGIES:   Allergies  Allergen Reactions  . Feraheme [Ferumoxytol] Itching  . Sulfa Antibiotics Itching    REVIEW OF SYSTEMS:   Review of Systems  Constitutional: Positive for fever.  Respiratory: Positive for cough, shortness of breath and wheezing.   Cardiovascular: Positive for leg  swelling and PND.    MEDICATIONS AT HOME:   Prior to Admission medications   Medication Sig Start Date End Date Taking? Authorizing Provider  acetaminophen (TYLENOL) 500 MG tablet Take 1,000 mg by mouth every 6 (six) hours as needed for mild pain, fever or headache.   Yes Historical Provider, MD  albuterol (PROVENTIL HFA;VENTOLIN HFA) 108 (90 BASE) MCG/ACT inhaler Inhale 2 puffs into the lungs every 6 (six) hours as needed for wheezing or shortness of breath. 12/19/14  Yes Dustin Flock, MD  amLODipine (NORVASC) 10 MG tablet Take 1 tablet (10 mg total) by mouth daily. 12/19/14  Yes Dustin Flock, MD  aspirin EC 81 MG tablet Take 81 mg by mouth at bedtime.   Yes Historical Provider, MD  budesonide (PULMICORT) 0.25 MG/2ML nebulizer solution Take 2 mLs (0.25 mg total) by nebulization 2 (two) times daily. 12/19/14  Yes Dustin Flock, MD  citalopram (CELEXA) 40 MG tablet Take 40 mg by mouth daily.   Yes Historical Provider, MD  clonazePAM (KLONOPIN) 2 MG tablet Take 1 mg by mouth at bedtime as needed (for sleep).    Yes Historical Provider, MD  hydrALAZINE (APRESOLINE) 100 MG tablet Take 1 tablet (100 mg total) by mouth every 8 (eight) hours. 12/19/14  Yes Dustin Flock, MD  ibuprofen (ADVIL,MOTRIN) 200 MG tablet Take 400-600 mg by mouth every 6 (six) hours as needed for  headache or mild pain.   Yes Historical Provider, MD  insulin aspart (NOVOLOG FLEXPEN) 100 UNIT/ML FlexPen Inject 0-12 Units into the skin 3 (three) times daily with meals as needed for high blood sugar. Pt uses as needed per sliding scale.   Yes Historical Provider, MD  Insulin Degludec (TRESIBA FLEXTOUCH) 100 UNIT/ML SOPN Inject 38 Units into the skin at bedtime.   Yes Historical Provider, MD  ipratropium-albuterol (DUONEB) 0.5-2.5 (3) MG/3ML SOLN Take 3 mLs by nebulization every 6 (six) hours as needed (for wheezing/shortness of breath).   Yes Historical Provider, MD  losartan (COZAAR) 50 MG tablet Take 1 tablet (50 mg total) by  mouth daily. 01/03/15  Yes Dustin Flock, MD  Metoprolol Tartrate 75 MG TABS Take 75 mg by mouth 2 (two) times daily. 12/19/14  Yes Dustin Flock, MD  potassium chloride (K-DUR,KLOR-CON) 10 MEQ tablet Take 1 tablet (10 mEq total) by mouth 2 (two) times daily. 12/19/14  Yes Dustin Flock, MD  simvastatin (ZOCOR) 40 MG tablet Take 40 mg by mouth at bedtime.   Yes Historical Provider, MD  torsemide (DEMADEX) 20 MG tablet Take 40 mg by mouth 2 (two) times daily as needed (for edema).   Yes Historical Provider, MD      VITAL SIGNS:  Blood pressure 146/86, pulse 84, temperature 100 F (37.8 C), resp. rate 31, height 5\' 4"  (1.626 m), weight 154.631 kg (340 lb 14.4 oz), last menstrual period 08/01/2015, SpO2 94 %.  PHYSICAL EXAMINATION:   Physical Exam  Constitutional: She is oriented to person, place, and time. No distress.  Morbidly obese  HENT:  Head: Normocephalic.  Eyes: No scleral icterus.  Neck: Normal range of motion. Neck supple. No JVD present. No tracheal deviation present.  Cardiovascular: Normal rate, regular rhythm and normal heart sounds.  Exam reveals no gallop and no friction rub.   No murmur heard. Pulmonary/Chest: Effort normal. No respiratory distress. She has no wheezes. She has no rales. She exhibits no tenderness.  Decreased breath sounds throughout lung fields. Due to body habitus very difficult to auscultate lungs.  Abdominal: Soft. Bowel sounds are normal. She exhibits no distension and no mass. There is no tenderness. There is no rebound and no guarding.  Musculoskeletal: Normal range of motion. She exhibits edema (mild pitting).  Neurological: She is alert and oriented to person, place, and time.  Skin: Skin is warm. No rash noted. No erythema.  Psychiatric: Affect and judgment normal.      LABORATORY PANEL:   CBC  Recent Labs Lab 08/09/15 1355  WBC 6.8  HGB 11.8*  HCT 37.2  PLT 289    ------------------------------------------------------------------------------------------------------------------  Chemistries  Sodium 133 potassium 3.5 code 95 bicarbonate 29 creatinine 1.39 glucose 309 ------------------------------------------------------------------------------------------------------------------  Cardiac Enzymes  Recent Labs Lab 08/09/15 1355  TROPONINI 0.05*   ------------------------------------------------------------------------------------------------------------------  RADIOLOGY:  Dg Chest 2 View  08/09/2015  CLINICAL DATA:  Shortness of broad for 6 months, worsening shortness of breath last 2 days, productive cough for 2 days, history of asthma EXAM: CHEST  2 VIEW COMPARISON:  12/31/2014 FINDINGS: Cardiomegaly again noted. Mild perihilar bronchitic changes. Mild perihilar interstitial prominence suspicious for mild interstitial edema or pneumonitis. IMPRESSION: Mild perihilar bronchitic changes. Mild perihilar interstitial prominence suspicious for mild interstitial edema or pneumonitis. Cardiomegaly again noted. Electronically Signed   By: Lahoma Crocker M.D.   On: 08/09/2015 14:49    EKG:   Sinus rhythm no ST elevation or depression  IMPRESSION AND PLAN:   45 year old female with diabetes  which is poorly controlled, morbid obesity who presents with shortness of breath and low-grade fever.  1. Acute hypoxic respiratory failure: This is a combination of acute on chronic diastolic heart failure and acute bronchitis with probable underlying pneumonia. Start Levaquin and IV Lasix. Wean oxygen as tolerated.  2. Acute on chronic diastolic heart failure: Continue IV Lasix. Monitor intake and daily weight. Monitor daily BMP. Continue metoprolol and losartan. Echo October 2016 shows normal ejection fraction with diastolic heart failure. 3 Acute bronchitis with probable underlying community-acquired pneumonia: Start Levaquin and repeat chest x-ray in a.m.  4.  Uncontrolled diabetes: Check hemoglobin A1c. Diabetes coordinator consult. Continue outpatient regimen insulin. Next line continue sliding scale insulin and ADA diet.   5. Essential hypertension: Continue Norvasc, hydralazine, metoprolol and losartan.  6. Hyponatremia: Monitor sodium level while on Lasix. Check TSH.  7. Morbid obesity: Patient is encouraged to lose weight as tolerated. Physical therapy consult. She would benefit from outpatient sleep apnea evaluation.  All the records are reviewed and case discussed with ED provider. Management plans discussed with the patient and she agreement  CODE STATUS: FULL  TOTAL TIME TAKING CARE OF THIS PATIENT: 50 minutes.    Mouhamed Glassco M.D on 08/09/2015 at 3:16 PM  Between 7am to 6pm - Pager - [phone]  After 6pm go to [email or web address] - Proofreader  [facility]  Office  [phone]  CC: Primary care physician; Kasandra Knudsen, NP

## 2015-08-10 LAB — GLUCOSE, CAPILLARY
GLUCOSE-CAPILLARY: 222 mg/dL — AB (ref 65–99)
GLUCOSE-CAPILLARY: 208 mg/dL — AB (ref 65–99)
Glucose-Capillary: 177 mg/dL — ABNORMAL HIGH (ref 65–99)
Glucose-Capillary: 248 mg/dL — ABNORMAL HIGH (ref 65–99)

## 2015-08-10 LAB — CBC
HEMATOCRIT: 35.6 % (ref 35.0–47.0)
HEMOGLOBIN: 11.4 g/dL — AB (ref 12.0–16.0)
MCH: 26.9 pg (ref 26.0–34.0)
MCHC: 32.1 g/dL (ref 32.0–36.0)
MCV: 83.9 fL (ref 80.0–100.0)
Platelets: 279 10*3/uL (ref 150–440)
RBC: 4.25 MIL/uL (ref 3.80–5.20)
RDW: 16.2 % — ABNORMAL HIGH (ref 11.5–14.5)
WBC: 4.9 10*3/uL (ref 3.6–11.0)

## 2015-08-10 LAB — URINALYSIS COMPLETE WITH MICROSCOPIC (ARMC ONLY)
BILIRUBIN URINE: NEGATIVE
GLUCOSE, UA: 150 mg/dL — AB
Ketones, ur: NEGATIVE mg/dL
Nitrite: NEGATIVE
Protein, ur: >500 mg/dL — AB
Specific Gravity, Urine: 1.017 (ref 1.005–1.030)
pH: 5 (ref 5.0–8.0)

## 2015-08-10 LAB — TROPONIN I: Troponin I: <0.03 ng/mL (ref <0.031)

## 2015-08-10 LAB — BASIC METABOLIC PANEL
ANION GAP: 6 (ref 5–15)
BUN: 24 mg/dL — ABNORMAL HIGH (ref 6–20)
CO2: 29 mmol/L (ref 22–32)
Calcium: 7.1 mg/dL — ABNORMAL LOW (ref 8.9–10.3)
Chloride: 98 mmol/L — ABNORMAL LOW (ref 101–111)
Creatinine, Ser: 1.94 mg/dL — ABNORMAL HIGH (ref 0.44–1.00)
GFR calc Af Amer: 35 mL/min — ABNORMAL LOW (ref >60)
GFR, EST NON AFRICAN AMERICAN: 30 mL/min — AB (ref >60)
GLUCOSE: 338 mg/dL — AB (ref 65–99)
POTASSIUM: 4 mmol/L (ref 3.5–5.1)
SODIUM: 133 mmol/L — AB (ref 135–145)

## 2015-08-10 LAB — HEMOGLOBIN A1C: HEMOGLOBIN A1C: 10.7 % — AB (ref 4.0–6.0)

## 2015-08-10 LAB — MAGNESIUM: MAGNESIUM: 1.4 mg/dL — AB (ref 1.7–2.4)

## 2015-08-10 MED ORDER — DOCUSATE SODIUM 100 MG PO CAPS
100.0000 mg | ORAL_CAPSULE | Freq: Two times a day (BID) | ORAL | Status: DC
  Administered 2015-08-10 – 2015-08-13 (×7): 100 mg via ORAL
  Filled 2015-08-10 (×7): qty 1

## 2015-08-10 MED ORDER — LEVOFLOXACIN IN D5W 750 MG/150ML IV SOLN
750.0000 mg | INTRAVENOUS | Status: DC
  Filled 2015-08-10: qty 150

## 2015-08-10 MED ORDER — DEXTROSE 5 % IV SOLN
500.0000 mg | INTRAVENOUS | Status: DC
  Administered 2015-08-10: 500 mg via INTRAVENOUS
  Filled 2015-08-10 (×2): qty 500

## 2015-08-10 MED ORDER — GUAIFENESIN-DM 100-10 MG/5ML PO SYRP
5.0000 mL | ORAL_SOLUTION | ORAL | Status: DC | PRN
  Administered 2015-08-10 – 2015-08-12 (×5): 5 mL via ORAL
  Filled 2015-08-10 (×6): qty 5

## 2015-08-10 MED ORDER — INSULIN GLARGINE 100 UNIT/ML ~~LOC~~ SOLN
45.0000 [IU] | Freq: Every day | SUBCUTANEOUS | Status: DC
  Administered 2015-08-10 – 2015-08-12 (×3): 45 [IU] via SUBCUTANEOUS
  Filled 2015-08-10 (×4): qty 0.45

## 2015-08-10 MED ORDER — ATORVASTATIN CALCIUM 20 MG PO TABS
20.0000 mg | ORAL_TABLET | Freq: Every day | ORAL | Status: DC
  Administered 2015-08-10 – 2015-08-12 (×3): 20 mg via ORAL
  Filled 2015-08-10 (×4): qty 1

## 2015-08-10 MED ORDER — BUDESONIDE 0.25 MG/2ML IN SUSP
0.2500 mg | Freq: Two times a day (BID) | RESPIRATORY_TRACT | Status: DC
  Administered 2015-08-11: 0.25 mg via RESPIRATORY_TRACT
  Filled 2015-08-10 (×3): qty 2

## 2015-08-10 MED ORDER — DEXTROSE 5 % IV SOLN
2.0000 g | INTRAVENOUS | Status: DC
  Administered 2015-08-10 – 2015-08-12 (×3): 2 g via INTRAVENOUS
  Filled 2015-08-10 (×4): qty 2

## 2015-08-10 MED ORDER — FUROSEMIDE 10 MG/ML IJ SOLN
20.0000 mg | Freq: Two times a day (BID) | INTRAMUSCULAR | Status: DC
  Administered 2015-08-10 – 2015-08-13 (×7): 20 mg via INTRAVENOUS
  Filled 2015-08-10 (×7): qty 2

## 2015-08-10 MED ORDER — MAGNESIUM SULFATE 4 GM/100ML IV SOLN
4.0000 g | Freq: Once | INTRAVENOUS | Status: AC
  Administered 2015-08-10: 4 g via INTRAVENOUS
  Filled 2015-08-10: qty 100

## 2015-08-10 MED ORDER — INSULIN ASPART 100 UNIT/ML ~~LOC~~ SOLN
5.0000 [IU] | Freq: Three times a day (TID) | SUBCUTANEOUS | Status: DC
  Administered 2015-08-10 – 2015-08-13 (×8): 5 [IU] via SUBCUTANEOUS
  Filled 2015-08-10 (×8): qty 5

## 2015-08-10 NOTE — Progress Notes (Signed)
Inpatient Diabetes Program Recommendations  AACE/ADA: New Consensus Statement on Inpatient Glycemic Control (2015)  Target Ranges:  Prepandial:   less than 140 mg/dL      Peak postprandial:   less than 180 mg/dL (1-2 hours)      Critically ill patients:  140 - 180 mg/dL   Lab Results  Component Value Date   GLUCAP 222* 08/10/2015   HGBA1C 10.7* 08/09/2015    Review of Glycemic Control  Results for [name], [name] CORGAN (MRN [id number]) as of 08/10/2015 09:02  Ref. Range 08/09/2015 15:42 08/09/2015 18:24 08/09/2015 21:28 08/10/2015 07:52  Glucose-Capillary Latest Ref Range: 65-99 mg/dL 252 (H) 163 (H) 187 (H) 222 (H)    Diabetes history: Type 2 Outpatient Diabetes medications: Tresiba 38 units qhs, Novolog 0-12 units tid with meals Current orders for Inpatient glycemic control: Lantus 38 units qhs, Novolog 0-20 units tid with meals, Novolog 0-5 units qhs  Inpatient Diabetes Program Recommendations: Referral to diabetes coordinator noted.  Consider increasing Lantus insulin to 45 units qhs (0.3units/kg)- fasting blood sugar is elevated.    Consider adding Novolog 5 units tid with meals (since she's eating well) and continue Novolog correction as ordered.    Gentry Fitz, RN, BA, MHA, CDE Diabetes Coordinator Inpatient Diabetes Program  [phone] (Team Pager) [phone] ([place]) 08/10/2015 9:08 AM

## 2015-08-10 NOTE — Progress Notes (Signed)
[place] [facility] [place] NAME: [name]    MR#:  [id number]  DATE OF BIRTH:  03-12-1970  SUBJECTIVE:  CHIEF COMPLAINT:   Chief Complaint  Patient presents with  . Shortness of Breath  . Leg Swelling   Still SOB and cough, on O2 [place] 3 L. REVIEW OF SYSTEMS:  CONSTITUTIONAL: No fever, has weakness.  EYES: No blurred or double vision.  EARS, NOSE, AND THROAT: No tinnitus or ear pain.  RESPIRATORY: has cough, shortness of breath, no wheezing or hemoptysis.  CARDIOVASCULAR: No chest pain, orthopnea, has leg edema.  GASTROINTESTINAL: No nausea, vomiting, diarrhea or abdominal pain.  GENITOURINARY: No dysuria, hematuria.  ENDOCRINE: No polyuria, nocturia,  HEMATOLOGY: No anemia, easy bruising or bleeding SKIN: No rash or lesion. MUSCULOSKELETAL: No joint pain or arthritis.   NEUROLOGIC: No tingling, numbness, weakness.  PSYCHIATRY: No anxiety or depression.   DRUG ALLERGIES:   Allergies  Allergen Reactions  . Feraheme [Ferumoxytol] Itching  . Sulfa Antibiotics Itching    VITALS:  Blood pressure 143/77, pulse 77, temperature 98.8 F (37.1 C), temperature source Oral, resp. rate 20, height 5\' 4"  (1.626 m), weight 327 lb 8 oz (148.553 kg), last menstrual period 08/01/2015, SpO2 92 %.  PHYSICAL EXAMINATION:  GENERAL:  45 y.o.-year-old patient lying in the bed with no acute distress. Morbid obese. EYES: Pupils equal, round, reactive to light and accommodation. No scleral icterus. Extraocular muscles intact.  HEENT: Head atraumatic, normocephalic. Oropharynx and nasopharynx clear.  NECK:  Supple, no jugular venous distention. No thyroid enlargement, no tenderness.  LUNGS: Normal breath sounds bilaterally, no wheezing, rales,rhonchi or crepitation. No use of accessory muscles of respiration.  CARDIOVASCULAR: S1, S2 normal. No murmurs, rubs, or gallops.  ABDOMEN: Soft, nontender, nondistended. Bowel sounds present. No organomegaly or  mass.  EXTREMITIES: has leg edema, no cyanosis, or clubbing.  NEUROLOGIC: Cranial nerves II through XII are intact. Muscle strength 5/5 in all extremities. Sensation intact. Gait not checked.  PSYCHIATRIC: The patient is alert and oriented x 3.  SKIN: No obvious rash, lesion, or ulcer.    LABORATORY PANEL:   CBC  Recent Labs Lab 08/10/15 0141  WBC 4.9  HGB 11.4*  HCT 35.6  PLT 279   ------------------------------------------------------------------------------------------------------------------  Chemistries   Recent Labs Lab 08/09/15 1355  08/10/15 0141  NA 133*  --  133*  K 3.5  --  4.0  CL 95*  --  98*  CO2 29  --  29  GLUCOSE 309*  --  338*  BUN 19  --  24*  CREATININE 1.39*  < > 1.94*  CALCIUM 7.3*  --  7.1*  MG  --   --  1.4*  AST 30  --   --   ALT 19  --   --   ALKPHOS 138*  --   --   BILITOT 0.3  --   --   < > = values in this interval not displayed. ------------------------------------------------------------------------------------------------------------------  Cardiac Enzymes  Recent Labs Lab 08/10/15 0141  TROPONINI <0.03   ------------------------------------------------------------------------------------------------------------------  RADIOLOGY:  Dg Chest 2 View  08/09/2015  CLINICAL DATA:  Shortness of broad for 6 months, worsening shortness of breath last 2 days, productive cough for 2 days, history of asthma EXAM: CHEST  2 VIEW COMPARISON:  12/31/2014 FINDINGS: Cardiomegaly again noted. Mild perihilar bronchitic changes. Mild perihilar interstitial prominence suspicious for mild interstitial edema or pneumonitis. IMPRESSION: Mild perihilar bronchitic changes. Mild perihilar interstitial prominence suspicious  for mild interstitial edema or pneumonitis. Cardiomegaly again noted. Electronically Signed   By: Lahoma Crocker M.D.   On: 08/09/2015 14:49    EKG:   Orders placed or performed during the hospital encounter of 08/09/15  . ED EKG  . ED  EKG  . EKG 12-Lead  . EKG 12-Lead    ASSESSMENT AND PLAN:   45 year old female with diabetes which is poorly controlled, morbid obesity who presents with shortness of breath and low-grade fever.  1. Acute hypoxic respiratory failure: This is a combination of acute on chronic diastolic heart failure and acute bronchitis with probable underlying pneumonia. continue Levaquin and IV Lasix. Wean oxygen as tolerated.  2. Acute on chronic diastolic heart failure:  Decrease IV Lasix to 20 mg bid due to ARF. Monitor intake and daily weight. Monitor daily BMP. Continue metoprolol and hold losartan. Echo October 2016 shows normal ejection fraction with diastolic heart failure.  3 Acute bronchitis with probable underlying community-acquired pneumonia: S Continue Levaquin and f/u cultures.  * UTI. Continue levaquin, f/u U/C.  * ARF. Decrease lasix and hold losartan, f/u BMP.  * Hypomagnesemia. IV mag, f/u mag.  4. Uncontrolled diabetes: hemoglobin A1c 10.7. Per Diabetes coordinator consult,  Increase lantus to 45 units HS, add novolog 5 units with sliding scale insulin and ADA diet.   5. Essential hypertension: Continue Norvasc, hydralazine, metoprolol and hold losartan.  6. Pseudohyponatremia:  Due to hyperglycemia.  7. Morbid obesity: Patient is encouraged to lose weight as tolerated. Physical therapy consult. She would benefit from outpatient sleep apnea evaluation.  All the records are reviewed and case discussed with Care Management/Social Workerr. Management plans discussed with the patient, her husband and they are in agreement.  CODE STATUS: full code.  TOTAL TIME TAKING CARE OF THIS PATIENT: 42 minutes.  Greater than 50% time was spent on coordination of care and face-to-face counseling.  POSSIBLE D/C IN 3 DAYS, DEPENDING ON CLINICAL CONDITION.   Demetrios Loll M.D on 08/10/2015 at 1:38 PM  Between 7am to 6pm - Pager - [phone]  After 6pm go to [email or web address] -  password [email or web address] [email or web address]  [place] [facility]  Office  [phone]  CC: Primary care physician; Kasandra Knudsen, NP

## 2015-08-10 NOTE — Progress Notes (Signed)
Inpatient Diabetes Program Recommendations  AACE/ADA: New Consensus Statement on Inpatient Glycemic Control (2015)  Target Ranges:  Prepandial:   less than 140 mg/dL      Peak postprandial:   less than 180 mg/dL (1-2 hours)      Critically ill patients:  140 - 180 mg/dL    Met with patient regarding recent A1C of 10.7%- [name] tells me this is down from 11% which she had at her MD office almost 3 months ago.  Patient has previously been on Lantus insulin but changed to [name] in an attempt to get better control.  Patient tells me she "always" takes her [name] and Novolog insulin pre-meals, based on a sliding scale.  She tells me she rotates sites on her abdomen for insulin administration.  She has a follow up appointment with MD next month- she sees NP at Dr. Laurelyn Sickle office.     Gentry Fitz, RN, BA, MHA, CDE Diabetes Coordinator Inpatient Diabetes Program  [phone] (Team Pager) [phone] ([place]) 08/10/2015 1:13 PM

## 2015-08-10 NOTE — Progress Notes (Signed)
PT Cancellation Note  Patient Details Name: [name] MRN: [id number] DOB: 1970/05/02   Cancelled Treatment:    Reason Eval/Treat Not Completed: Other (comment). Consult received and evaluation attempted. Pt very sleepy upon arrival and kept falling asleep during history. Pt on room air with sats at 77%. RN notified. Donned 3L of O2 and cued for pursed lip breathing. O2 improved to 91%. Pt continues to be drowsy and pt requests therapy to re-attempt at a different time. Will re-attempt next date.   Jayko Voorhees 08/10/2015, 2:23 PM Greggory Stallion, PT, DPT [phone]

## 2015-08-10 NOTE — Progress Notes (Signed)
Respiratory therapist, Mikki Santee, called per patient request for SVN Rx. Barbaraann Faster, RN 1:20 AM 08/10/2015

## 2015-08-11 ENCOUNTER — Other Ambulatory Visit: Payer: Self-pay | Admitting: *Deleted

## 2015-08-11 ENCOUNTER — Telehealth: Payer: Self-pay | Admitting: Respiratory Therapy

## 2015-08-11 DIAGNOSIS — D509 Iron deficiency anemia, unspecified: Secondary | ICD-10-CM

## 2015-08-11 LAB — URINE CULTURE

## 2015-08-11 LAB — BASIC METABOLIC PANEL
ANION GAP: 8 (ref 5–15)
BUN: 32 mg/dL — ABNORMAL HIGH (ref 6–20)
CALCIUM: 7.7 mg/dL — AB (ref 8.9–10.3)
CHLORIDE: 96 mmol/L — AB (ref 101–111)
CO2: 30 mmol/L (ref 22–32)
Creatinine, Ser: 1.65 mg/dL — ABNORMAL HIGH (ref 0.44–1.00)
GFR calc non Af Amer: 37 mL/min — ABNORMAL LOW (ref >60)
GFR, EST AFRICAN AMERICAN: 43 mL/min — AB (ref >60)
Glucose, Bld: 144 mg/dL — ABNORMAL HIGH (ref 65–99)
Potassium: 4 mmol/L (ref 3.5–5.1)
SODIUM: 134 mmol/L — AB (ref 135–145)

## 2015-08-11 LAB — GLUCOSE, CAPILLARY
GLUCOSE-CAPILLARY: 121 mg/dL — AB (ref 65–99)
GLUCOSE-CAPILLARY: 172 mg/dL — AB (ref 65–99)
GLUCOSE-CAPILLARY: 137 mg/dL — AB (ref 65–99)
Glucose-Capillary: 221 mg/dL — ABNORMAL HIGH (ref 65–99)

## 2015-08-11 LAB — MAGNESIUM: MAGNESIUM: 1.8 mg/dL (ref 1.7–2.4)

## 2015-08-11 MED ORDER — SALINE SPRAY 0.65 % NA SOLN
1.0000 | NASAL | Status: DC | PRN
  Filled 2015-08-11: qty 44

## 2015-08-11 MED ORDER — AZITHROMYCIN 250 MG PO TABS
500.0000 mg | ORAL_TABLET | ORAL | Status: DC
  Administered 2015-08-11 – 2015-08-12 (×2): 500 mg via ORAL
  Filled 2015-08-11 (×2): qty 2

## 2015-08-11 NOTE — Progress Notes (Signed)
Inpatient Diabetes Program Recommendations  AACE/ADA: New Consensus Statement on Inpatient Glycemic Control (2015)  Target Ranges:  Prepandial:   less than 140 mg/dL      Peak postprandial:   less than 180 mg/dL (1-2 hours)      Critically ill patients:  140 - 180 mg/dL   Lab Results  Component Value Date   GLUCAP 121* 08/11/2015   HGBA1C 10.7* 08/09/2015    Review of Glycemic Control  Results for Melberg, QUARTNEY WINKLEPLECK (MRN [id number]) as of 08/11/2015 10:09  Ref. Range 08/10/2015 07:52 08/10/2015 12:13 08/10/2015 17:07 08/10/2015 21:05 08/11/2015 07:29  Glucose-Capillary Latest Ref Range: 65-99 mg/dL 222 (H) 208 (H) 177 (H) 248 (H) 121 (H)    Diabetes history: Type 2 Outpatient Diabetes medications: Tresiba 38 units qhs, Novolog 0-12 units tid with meals Current orders for Inpatient glycemic control: Lantus 45 units qhs, Novolog 0-20 units tid with meals, Novolog 0-5 units qhs, Novolog 5 units tid  Inpatient Diabetes Program Recommendations: Ideal fasting blood sugar this am.   If post prandial blood sugars remain elevated despite the addition of Novolog 5 units tid, consider increasing Novolog to 7 units tid and continue Novolog correction insulin as ordered.   Gentry Fitz, RN, BA, MHA, CDE Diabetes Coordinator Inpatient Diabetes Program  [phone] (Team Pager) [phone] ([place]) 08/11/2015 10:11 AM

## 2015-08-11 NOTE — Progress Notes (Signed)
Pt has an upper airway wheeze. Lung fields are diminished. No wheezing noted except in upper airway

## 2015-08-11 NOTE — Progress Notes (Signed)
Antibiotic IV to Oral Route Change Policy  RECOMMENDATION: This patient is receiving azithromycin by the intravenous route.  Based on criteria approved by the Pharmacy and Therapeutics Committee, the antibiotic(s) is/are being converted to the equivalent oral dose form(s).   DESCRIPTION: These criteria include:  Patient being treated for a respiratory tract infection, urinary tract infection, cellulitis or clostridium difficile associated diarrhea if on metronidazole  The patient is not neutropenic and does not exhibit a GI malabsorption state  The patient is eating (either orally or via tube) and/or has been taking other orally administered medications for a least 24 hours  The patient is improving clinically and has a Tmax < 100.5  If you have questions about this conversion, please contact the Pharmacy Department  []   [phone] )  [facility] [x]   [phone] )  [facility] []   [phone] )  [name] []   [phone] )  [facility] []   [phone] )  [place], PharmD Clinical Pharmacist  08/11/2015 11:04 AM

## 2015-08-11 NOTE — Progress Notes (Signed)
SATURATION QUALIFICATIONS: (This note is used to comply with regulatory documentation for home oxygen)  Patient Saturations on Room Air at Rest = 82%  Please briefly explain why patient needs home oxygen: Pt requires 3L of oxygen to maintain sats of 90-92% at rest.

## 2015-08-11 NOTE — Telephone Encounter (Signed)
Called Ms. Pineault about coming to [employer]. She did not answer and I left her a message to return my call.  Her Medical Evaluation was on 05/02/2015.  She has not returned since.

## 2015-08-11 NOTE — Evaluation (Signed)
Physical Therapy Evaluation Patient Details Name: [name] MRN: [id number] DOB: 05-16-1970 Today's Date: 08/11/2015   History of Present Illness  Pt admitted for hypoxia. Pt with with history of DM and heart failure. Pt with initial complaints of SOB and fever.   Clinical Impression  Pt is a pleasant 45 year old female who was admitted for hypoxia. Upon eval attempt yesterday, pt on room air with sats decreasing to 77%. This date, pt on 3L of O2 with sats at 91%. Upon mobility, pt with sats decreasing to 88% with SOB symptoms noted. Pt cued for pursed lip breathing and sats improve to 93%. RN in room. Pt performs bed mobility, transfers, and ambulation with independence with no AD required. Pt demonstrates deficits with endurance. Would benefit from skilled PT to address above deficits and promote optimal return to PLOF. Pt agreeable to HHPT for endurance training with progression to begin [facility] again.      Follow Up Recommendations Home health PT (and [facility] program)    Materials engineer for Other Services       Precautions / Restrictions Precautions Precautions: Fall Restrictions Weight Bearing Restrictions: No      Mobility  Bed Mobility Overal bed mobility: Independent             General bed mobility comments: safe technique performed  Transfers Overall transfer level: Independent Equipment used: None             General transfer comment: safe technique performed without AD. O2 donned for all mobility  Ambulation/Gait Ambulation/Gait assistance: Independent Ambulation Distance (Feet): 30 Feet Assistive device: None Gait Pattern/deviations: Step-through pattern     General Gait Details: ambulated using no AD with reciprocal gait pattern and 3L of O2 donned. Pt demonstrates safe technique during turns and no LOB noted. Pt fatigues quickly and is unable to further ambulate.  Stairs            Wheelchair  Mobility    Modified Rankin (Stroke Patients Only)       Balance Overall balance assessment: Independent                                           Pertinent Vitals/Pain Pain Assessment: No/denies pain    Home Living Family/patient expects to be discharged to:: Private residence Living Arrangements: Spouse/significant other;Children Available Help at Discharge: Family Type of Home: House Home Access: Stairs to enter Entrance Stairs-Rails: None Technical brewer of Steps: 4 Home Layout: One level Home Equipment: None      Prior Function Level of Independence: Independent         Comments: minimal household ambulator from bedroom->bathroom     Hand Dominance        Extremity/Trunk Assessment   Upper Extremity Assessment: Generalized weakness (B UE grossly 4+/5)           Lower Extremity Assessment: Generalized weakness (B LE grossly 4+/5)         Communication   Communication: No difficulties  Cognition Arousal/Alertness: Awake/alert Behavior During Therapy: WFL for tasks assessed/performed Overall Cognitive Status: Within Functional Limits for tasks assessed                      General Comments      Exercises Other Exercises Other Exercises: Pt able to performed seated ther-ex  on B LE including LAQ and B UE shoulder press x 10 reps as a screening tool. Pt rates moderate SOB with exertion. All ther-ex performed with supervision      Assessment/Plan    PT Assessment Patient needs continued PT services  PT Diagnosis Generalized weakness   PT Problem List Decreased strength;Decreased activity tolerance;Cardiopulmonary status limiting activity  PT Treatment Interventions Therapeutic exercise   PT Goals (Current goals can be found in the Care Plan section) Acute Rehab PT Goals Patient Stated Goal: to get stronger PT Goal Formulation: With patient Time For Goal Achievement: 08/25/15 Potential to Achieve Goals:  Good    Frequency Min 2X/week   Barriers to discharge        Co-evaluation               End of Session Equipment Utilized During Treatment: Oxygen Activity Tolerance: Patient limited by fatigue Patient left: in bed;with nursing/sitter in room Nurse Communication: Mobility status         Time: [id number] PT Time Calculation (min) (ACUTE ONLY): 10 min   Charges:   PT Evaluation $PT Eval Low Complexity: 1 Procedure     PT G Codes:        Raelene Trew 08-25-2015, 9:42 AM  Greggory Stallion, PT, DPT [phone]

## 2015-08-11 NOTE — Progress Notes (Signed)
[place] at [place] NAME: [name]    MR#:  [id number]  DATE OF BIRTH:  1970/12/18  SUBJECTIVE:  CHIEF COMPLAINT:   Chief Complaint  Patient presents with  . Shortness of Breath  . Leg Swelling   Still SOB and cough, on O2 [place] 3 L. REVIEW OF SYSTEMS:  CONSTITUTIONAL: No fever, has weakness.  EYES: No blurred or double vision.  EARS, NOSE, AND THROAT: No tinnitus or ear pain.  RESPIRATORY: has cough, shortness of breath and wheezing no hemoptysis.  CARDIOVASCULAR: No chest pain, orthopnea, has leg edema.  GASTROINTESTINAL: No nausea, vomiting, diarrhea or abdominal pain.  GENITOURINARY: No dysuria, hematuria.  ENDOCRINE: No polyuria, nocturia,  HEMATOLOGY: No anemia, easy bruising or bleeding SKIN: No rash or lesion. MUSCULOSKELETAL: No joint pain or arthritis.   NEUROLOGIC: No tingling, numbness, weakness.  PSYCHIATRY: No anxiety or depression.   DRUG ALLERGIES:   Allergies  Allergen Reactions  . Feraheme [Ferumoxytol] Itching  . Sulfa Antibiotics Itching    VITALS:  Blood pressure 118/54, pulse 76, temperature 98.1 F (36.7 C), temperature source Oral, resp. rate 21, height 5\' 4"  (1.626 m), weight 333 lb 3.2 oz (151.139 kg), last menstrual period 08/01/2015, SpO2 93 %.  PHYSICAL EXAMINATION:  GENERAL:  45 y.o.-year-old patient lying in the bed with no acute distress. Morbid obese. EYES: Pupils equal, round, reactive to light and accommodation. No scleral icterus. Extraocular muscles intact.  HEENT: Head atraumatic, normocephalic. Oropharynx and nasopharynx clear.  NECK:  Supple, no jugular venous distention. No thyroid enlargement, no tenderness.  LUNGS: Normal breath sounds bilaterally, bilateral wheezing, no rales,rhonchi or crepitation. No use of accessory muscles of respiration.  CARDIOVASCULAR: S1, S2 normal. No murmurs, rubs, or gallops.  ABDOMEN: Soft, nontender, nondistended. Bowel sounds present. No  organomegaly or mass.  EXTREMITIES: has leg edema, no cyanosis, or clubbing.  NEUROLOGIC: Cranial nerves II through XII are intact. Muscle strength 5/5 in all extremities. Sensation intact. Gait not checked.  PSYCHIATRIC: The patient is alert and oriented x 3.  SKIN: No obvious rash, lesion, or ulcer.    LABORATORY PANEL:   CBC  Recent Labs Lab 08/10/15 0141  WBC 4.9  HGB 11.4*  HCT 35.6  PLT 279   ------------------------------------------------------------------------------------------------------------------  Chemistries   Recent Labs Lab 08/09/15 1355  08/11/15 0416  NA 133*  < > 134*  K 3.5  < > 4.0  CL 95*  < > 96*  CO2 29  < > 30  GLUCOSE 309*  < > 144*  BUN 19  < > 32*  CREATININE 1.39*  < > 1.65*  CALCIUM 7.3*  < > 7.7*  MG  --   < > 1.8  AST 30  --   --   ALT 19  --   --   ALKPHOS 138*  --   --   BILITOT 0.3  --   --   < > = values in this interval not displayed. ------------------------------------------------------------------------------------------------------------------  Cardiac Enzymes  Recent Labs Lab 08/10/15 0141  TROPONINI <0.03   ------------------------------------------------------------------------------------------------------------------  RADIOLOGY:  No results found.  EKG:   Orders placed or performed during the hospital encounter of 08/09/15  . ED EKG  . ED EKG  . EKG 12-Lead  . EKG 12-Lead    ASSESSMENT AND PLAN:   44 year old female with diabetes which is poorly controlled, morbid obesity who presents with shortness of breath and low-grade fever.  1. Acute hypoxic respiratory failure: This  is a combination of acute on chronic diastolic heart failure and acute bronchitis with probable underlying pneumonia. continue Levaquin and IV Lasix. NEB prn. Wean oxygen as tolerated.  2. Acute on chronic diastolic heart failure:  Decrease IV Lasix to 20 mg bid due to ARF. Monitor intake and daily weight. Monitor daily  BMP. Continue metoprolol and hold losartan. Echo October 2016 shows normal ejection fraction with diastolic heart failure.  3 Acute bronchitis with probable underlying community-acquired pneumonia: S Continue Levaquin and f/u cultures.  * UTI. Continue levaquin..  * ARF. Decreased lasix and hold losartan, better, f/u BMP.  * Hypomagnesemia. IV mag, improved.  4. Uncontrolled diabetes: hemoglobin A1c 10.7. Per Diabetes coordinator consult,  Increased lantus to 45 units HS, add novolog 5 units with sliding scale insulin and ADA diet.   5. Essential hypertension: Continue Norvasc, hydralazine, metoprolol and hold losartan.  6. Pseudohyponatremia:  Due to hyperglycemia.  7. Morbid obesity: Patient is encouraged to lose weight as tolerated. Physical therapy consult: HHPT.  She would benefit from outpatient sleep apnea evaluation.  All the records are reviewed and case discussed with Care Management/Social Workerr. Management plans discussed with the patient, her husband and they are in agreement.  CODE STATUS: full code.  TOTAL TIME TAKING CARE OF THIS PATIENT: 38 minutes.  Greater than 50% time was spent on coordination of care and face-to-face counseling.  POSSIBLE D/C IN 2-3 DAYS, DEPENDING ON CLINICAL CONDITION.   Demetrios Loll M.D on 08/11/2015 at 3:58 PM  Between 7am to 6pm - Pager - [phone]  After 6pm go to [email or web address] - password [email or web address] [facility]  [place] [facility]  Office  [phone]  CC: Primary care physician; Kasandra Knudsen, NP

## 2015-08-11 NOTE — Progress Notes (Signed)
Initial Heart Failure Clinic appointment scheduled for August 30, 2015 at 10:00am. Thank you.

## 2015-08-11 NOTE — Care Management (Signed)
Patient admitted Acute hypoxic respiratory failure:.  Patient lives at home with her husband and children.  Obtains her medications from [facility] [address]. And denies any issues obtaining her medications.  Patient has qualifying o2 saturations and qualifying diagnosis.  PT has recommended Home health PT (and [facility] program). I have notified patient that she does not qualify for home health Pt services with her insurance coverage.  Patient could possibily benefit from outpatient pulmonary rehab

## 2015-08-11 NOTE — Clinical Documentation Improvement (Signed)
Internal Medicine at [facility]  Please document query responses in the progress notes and discharge summary, not on the CDI BPA in [facility].  "Sepsis" is documented by the ED provider and is not documented in the H&P or subsequent progress notes.  Please clarify if the diagnosis of Sepsis has been  - Ruled out and is not applicable to this admission  - Confirmed and is applicable to this admission   - Unable to clinically determine.  Clinical Information: "Sepsis, due to unspecified organism ([place]) CAP (community acquired pneumonia) Renal insufficiency Hypercarbia Hypoxia" documented in ED provider note   Please exercise your independent, professional judgment when responding. A specific answer is not anticipated or expected.   Thank You, Erling Conte  RN BSN CCDS [phone] [facility] [place]

## 2015-08-12 LAB — BASIC METABOLIC PANEL
ANION GAP: 7 (ref 5–15)
BUN: 22 mg/dL — ABNORMAL HIGH (ref 6–20)
CHLORIDE: 99 mmol/L — AB (ref 101–111)
CO2: 30 mmol/L (ref 22–32)
Calcium: 8.2 mg/dL — ABNORMAL LOW (ref 8.9–10.3)
Creatinine, Ser: 1.09 mg/dL — ABNORMAL HIGH (ref 0.44–1.00)
GFR calc non Af Amer: >60 mL/min (ref >60)
GLUCOSE: 122 mg/dL — AB (ref 65–99)
Potassium: 3.9 mmol/L (ref 3.5–5.1)
Sodium: 136 mmol/L (ref 135–145)

## 2015-08-12 LAB — GLUCOSE, CAPILLARY
GLUCOSE-CAPILLARY: 130 mg/dL — AB (ref 65–99)
GLUCOSE-CAPILLARY: 165 mg/dL — AB (ref 65–99)
GLUCOSE-CAPILLARY: 209 mg/dL — AB (ref 65–99)
Glucose-Capillary: 101 mg/dL — ABNORMAL HIGH (ref 65–99)

## 2015-08-12 NOTE — Progress Notes (Signed)
[place] [facility] [place] NAME: [name]    MR#:  [id number]  DATE OF BIRTH:  1970/08/29  SUBJECTIVE:  CHIEF COMPLAINT:   Chief Complaint  Patient presents with  . Shortness of Breath  . Leg Swelling   better SOB and cough, on O2 [place] 3 L. REVIEW OF SYSTEMS:  CONSTITUTIONAL: No fever, has weakness.  EYES: No blurred or double vision.  EARS, NOSE, AND THROAT: No tinnitus or ear pain.  RESPIRATORY: has cough, shortness of breath and wheezing no hemoptysis.  CARDIOVASCULAR: No chest pain, orthopnea, has leg edema.  GASTROINTESTINAL: No nausea, vomiting, diarrhea or abdominal pain.  GENITOURINARY: No dysuria, hematuria.  ENDOCRINE: No polyuria, nocturia,  HEMATOLOGY: No anemia, easy bruising or bleeding SKIN: No rash or lesion. MUSCULOSKELETAL: No joint pain or arthritis.   NEUROLOGIC: No tingling, numbness, weakness.  PSYCHIATRY: No anxiety or depression.   DRUG ALLERGIES:   Allergies  Allergen Reactions  . Feraheme [Ferumoxytol] Itching  . Sulfa Antibiotics Itching    VITALS:  Blood pressure 146/73, pulse 81, temperature 98.4 F (36.9 C), temperature source Oral, resp. rate 20, height 5\' 4"  (1.626 m), weight 330 lb 8 oz (149.914 kg), last menstrual period 08/01/2015, SpO2 98 %.  PHYSICAL EXAMINATION:  GENERAL:  45 y.o.-year-old patient lying in the bed with no acute distress. Morbid obese. EYES: Pupils equal, round, reactive to light and accommodation. No scleral icterus. Extraocular muscles intact.  HEENT: Head atraumatic, normocephalic. Oropharynx and nasopharynx clear.  NECK:  Supple, no jugular venous distention. No thyroid enlargement, no tenderness.  LUNGS: Normal breath sounds bilaterally, bilateral wheezing, no rales,rhonchi or crepitation. No use of accessory muscles of respiration.  CARDIOVASCULAR: S1, S2 normal. No murmurs, rubs, or gallops.  ABDOMEN: Soft, nontender, nondistended. Bowel sounds present. No  organomegaly or mass.  EXTREMITIES: has leg edema, no cyanosis, or clubbing.  NEUROLOGIC: Cranial nerves II through XII are intact. Muscle strength 5/5 in all extremities. Sensation intact. Gait not checked.  PSYCHIATRIC: The patient is alert and oriented x 3.  SKIN: No obvious rash, lesion, or ulcer.    LABORATORY PANEL:   CBC  Recent Labs Lab 08/10/15 0141  WBC 4.9  HGB 11.4*  HCT 35.6  PLT 279   ------------------------------------------------------------------------------------------------------------------  Chemistries   Recent Labs Lab 08/09/15 1355  08/11/15 0416 08/12/15 0823  NA 133*  < > 134* 136  K 3.5  < > 4.0 3.9  CL 95*  < > 96* 99*  CO2 29  < > 30 30  GLUCOSE 309*  < > 144* 122*  BUN 19  < > 32* 22*  CREATININE 1.39*  < > 1.65* 1.09*  CALCIUM 7.3*  < > 7.7* 8.2*  MG  --   < > 1.8  --   AST 30  --   --   --   ALT 19  --   --   --   ALKPHOS 138*  --   --   --   BILITOT 0.3  --   --   --   < > = values in this interval not displayed. ------------------------------------------------------------------------------------------------------------------  Cardiac Enzymes  Recent Labs Lab 08/10/15 0141  TROPONINI <0.03   ------------------------------------------------------------------------------------------------------------------  RADIOLOGY:  No results found.  EKG:   Orders placed or performed during the hospital encounter of 08/09/15  . ED EKG  . ED EKG  . EKG 12-Lead  . EKG 12-Lead    ASSESSMENT AND PLAN:   45 year old  female with diabetes which is poorly controlled, morbid obesity who presents with shortness of breath and low-grade fever.  1. Acute hypoxic respiratory failure: This is a combination of acute on chronic diastolic heart failure and acute bronchitis with probable underlying pneumonia. continue Levaquin and IV Lasix. NEB prn. Continue oxygen [place], she needs home O2 [place].  2. Acute on chronic diastolic heart failure:  Decreased  IV Lasix to 20 mg bid due to ARF. Monitor intake and daily weight. Monitor daily BMP. Continue metoprolol and hold losartan. Echo October 2016 shows normal ejection fraction with diastolic heart failure.  3 Acute bronchitis with probable underlying community-acquired pneumonia: S discontinued Levaquin and changed to zithromax and rocephin.  * UTI. Continue rocephin.  * ARF. Decreased lasix and hold losartan, improved.  * Hypomagnesemia. IV mag, improved.  4. Uncontrolled diabetes: hemoglobin A1c 10.7. Per Diabetes coordinator consult,  Increased lantus to 45 units HS, add novolog 5 units with sliding scale insulin and ADA diet.   5. Essential hypertension: Continue Norvasc, hydralazine, metoprolol and hold losartan.  6. Pseudohyponatremia:  Due to hyperglycemia. Improved.  7. Morbid obesity: Patient is encouraged to lose weight as tolerated. Physical therapy consult: HHPT.  She would benefit from outpatient sleep apnea evaluation.  All the records are reviewed and case discussed with Care Management/Social Workerr. Management plans discussed with the patient, her husband and they are in agreement.  CODE STATUS: full code.  TOTAL TIME TAKING CARE OF THIS PATIENT: 38 minutes.  Greater than 50% time was spent on coordination of care and face-to-face counseling.  POSSIBLE D/C IN 2 DAYS, DEPENDING ON CLINICAL CONDITION.   Demetrios Loll M.D on 08/12/2015 at 1:48 PM  Between 7am to 6pm - Pager - [phone]  After 6pm go to [email or web address] - password [email or web address] [facility]  [facility]  Office  [phone]  CC: Primary care physician; Kasandra Knudsen, NP

## 2015-08-13 LAB — GLUCOSE, CAPILLARY
Glucose-Capillary: 185 mg/dL — ABNORMAL HIGH (ref 65–99)
Glucose-Capillary: 137 mg/dL — ABNORMAL HIGH (ref 65–99)

## 2015-08-13 LAB — CBC
HCT: 39 % (ref 35.0–47.0)
Hemoglobin: 12.4 g/dL (ref 12.0–16.0)
MCH: 26.7 pg (ref 26.0–34.0)
MCHC: 31.9 g/dL — AB (ref 32.0–36.0)
MCV: 83.7 fL (ref 80.0–100.0)
PLATELETS: 359 10*3/uL (ref 150–440)
RBC: 4.65 MIL/uL (ref 3.80–5.20)
RDW: 16.2 % — AB (ref 11.5–14.5)
WBC: 4.1 10*3/uL (ref 3.6–11.0)

## 2015-08-13 MED ORDER — INSULIN DEGLUDEC 100 UNIT/ML ~~LOC~~ SOPN: 45.0000 [IU] | PEN_INJECTOR | Freq: Every day | SUBCUTANEOUS | Status: DC

## 2015-08-13 MED ORDER — TORSEMIDE 20 MG PO TABS: 40.0000 mg | ORAL_TABLET | Freq: Every day | ORAL | Status: DC

## 2015-08-13 MED ORDER — AZITHROMYCIN 500 MG PO TABS: 500.0000 mg | ORAL_TABLET | Freq: Every day | ORAL | Status: DC

## 2015-08-13 MED ORDER — GUAIFENESIN-DM 100-10 MG/5ML PO SYRP: 5.0000 mL | ORAL_SOLUTION | ORAL | Status: DC | PRN

## 2015-08-13 NOTE — Care Management Note (Signed)
Case Management Note  Patient Details  Name: [name] MRN: [id number] Date of Birth: 04-Jun-1970  Subjective/Objective:   Referral faxed to [facility] (patient's choice) requesting home health RN per [name] did not have a qualifying diagnosis for PT. [name] qualified today for new home oxygen which was requested from [facility] to deliver a portable tank to her room and to set up new home oxygen at her home.   Action/Plan:   Expected Discharge Date:                  Expected Discharge Plan:     In-House Referral:     Discharge planning Services     Post Acute Care Choice:    Choice offered to:     DME Arranged:    DME Agency:     HH Arranged:    [place] Agency:     Status of Service:     Medicare Important Message Given:    Date Medicare IM Given:    Medicare IM give by:    Date Additional Medicare IM Given:    Additional Medicare Important Message give by:     If discussed at [place] of Stay Meetings, dates discussed:    Additional Comments:  Zykeriah Mathia A, RN 08/13/2015, 1:02 PM

## 2015-08-13 NOTE — Progress Notes (Addendum)
[name] saturations at rest without oxygen was 91% Ambulating in room without oxygen sat was 88% Ambulating in room with oxygen sat at 2L via [place] was 93%  Lauris Poag, RN 08/13/15 11:34

## 2015-08-13 NOTE — Discharge Summary (Signed)
[place] at [place] NAME: [name]    MR#:  [id number]  DATE OF BIRTH:  Feb 16, 1971  DATE OF ADMISSION:  08/09/2015 ADMITTING PHYSICIAN: Bettey Costa, MD  DATE OF DISCHARGE: 08/13/2015 PRIMARY CARE PHYSICIAN: HOLLAND, [place], NP    ADMISSION DIAGNOSIS:  Hypercarbia [R06.89] Renal insufficiency [N28.9] Hypoxia [R09.02] Elevated troponin [R79.89] CAP (community acquired pneumonia) [J18.9] Sepsis, due to unspecified organism ([place]) [A41.9]   DISCHARGE DIAGNOSIS:  Acute hypoxic respiratory failure  Acute on chronic diastolic heart failure  Acute bronchitis with probable underlying community-acquired pneumonia UTI ARF SECONDARY DIAGNOSIS:   Past Medical History  Diagnosis Date  . Poorly controlled diabetes mellitus ([place])     a. 11/2014 A1c 13.2.  . Iron deficiency anemia   . Asthma   . Hypertension   . Pericardial effusion     a. 11/2014 CT Chest: Mod effusion.  . Diabetes mellitus without complication ([place])   . CHF (congestive heart failure) [facility])     HOSPITAL COURSE:  45 year old female with diabetes which is poorly controlled, morbid obesity who presents with shortness of breath and low-grade fever.  1. Acute hypoxic respiratory failure: This is a combination of acute on chronic diastolic heart failure and acute bronchitis with probable underlying pneumonia. She was treated with antibiotics and IV Lasix. NEB prn. Continue oxygen [place], she needs home O2 [place] 2 L.  2. Acute on chronic diastolic heart failure:  Decreased IV Lasix to 20 mg bid due to ARF. She is taking torsemide 40 mg twice a day as needed and needs to take torsemide 40 mg daily. Continue metoprolol and hold losartan. Echo October 2016 shows normal ejection fraction with diastolic heart failure.  3 Acute bronchitis with probable underlying community-acquired pneumonia: discontinued Levaquin and changed to zithromax and rocephin. Continue Zithromax by mouth  after discharge.  * UTI. She is treated with rocephin.  * ARF. Decreased lasix and hold losartan, improved.  * Hypomagnesemia. IV mag, improved.  4. Uncontrolled diabetes: hemoglobin A1c 10.7. Per Diabetes coordinator consult,  Increased lantus to 45 units HS, addedovolog 5 units with sliding scale insulin and ADA diet.   5. Essential hypertension: Continue Norvasc, hydralazine, metoprolol and hold losartan.  6. Pseudohyponatremia: Due to hyperglycemia. Improved.  7. Morbid obesity: Patient is encouraged to lose weight as tolerated. Physical therapy consult: HHPT.  She would benefit from outpatient sleep apnea evaluation.   DISCHARGE CONDITIONS:   Stable, discharge to home with home health and PT today.  CONSULTS OBTAINED:     DRUG ALLERGIES:   Allergies  Allergen Reactions  . Feraheme [Ferumoxytol] Itching  . Sulfa Antibiotics Itching    DISCHARGE MEDICATIONS:   Current Discharge Medication List    START taking these medications   Details  azithromycin (ZITHROMAX) 500 MG tablet Take 1 tablet (500 mg total) by mouth daily. Qty: 5 tablet, Refills: 0    guaiFENesin-dextromethorphan (ROBITUSSIN DM) 100-10 MG/5ML syrup Take 5 mLs by mouth every 4 (four) hours as needed for cough. Qty: 118 mL, Refills: 0      CONTINUE these medications which have CHANGED   Details  Insulin Degludec (TRESIBA FLEXTOUCH) 100 UNIT/ML SOPN Inject 45 Units into the skin at bedtime. Qty: 1 pen, Refills: 2    torsemide (DEMADEX) 20 MG tablet Take 2 tablets (40 mg total) by mouth daily. Qty: 30 tablet, Refills: 0      CONTINUE these medications which have NOT CHANGED   Details  acetaminophen (TYLENOL) 500 MG  tablet Take 1,000 mg by mouth every 6 (six) hours as needed for mild pain, fever or headache.    albuterol (PROVENTIL HFA;VENTOLIN HFA) 108 (90 BASE) MCG/ACT inhaler Inhale 2 puffs into the lungs every 6 (six) hours as needed for wheezing or shortness of breath. Qty: 1 Inhaler,  Refills: 0    amLODipine (NORVASC) 10 MG tablet Take 1 tablet (10 mg total) by mouth daily. Qty: 30 tablet, Refills: 0    aspirin EC 81 MG tablet Take 81 mg by mouth at bedtime.    budesonide (PULMICORT) 0.25 MG/2ML nebulizer solution Take 2 mLs (0.25 mg total) by nebulization 2 (two) times daily. Qty: 60 mL, Refills: 12    citalopram (CELEXA) 40 MG tablet Take 40 mg by mouth daily.    clonazePAM (KLONOPIN) 2 MG tablet Take 1 mg by mouth at bedtime as needed (for sleep).     hydrALAZINE (APRESOLINE) 100 MG tablet Take 1 tablet (100 mg total) by mouth every 8 (eight) hours. Qty: 60 tablet, Refills: 0    insulin aspart (NOVOLOG FLEXPEN) 100 UNIT/ML FlexPen Inject 0-12 Units into the skin 3 (three) times daily with meals as needed for high blood sugar. Pt uses as needed per sliding scale.    ipratropium-albuterol (DUONEB) 0.5-2.5 (3) MG/3ML SOLN Take 3 mLs by nebulization every 6 (six) hours as needed (for wheezing/shortness of breath).    losartan (COZAAR) 50 MG tablet Take 1 tablet (50 mg total) by mouth daily. Qty: 30 tablet, Refills: 0    Metoprolol Tartrate 75 MG TABS Take 75 mg by mouth 2 (two) times daily. Qty: 60 tablet, Refills: 0    potassium chloride (K-DUR,KLOR-CON) 10 MEQ tablet Take 1 tablet (10 mEq total) by mouth 2 (two) times daily. Qty: 30 tablet, Refills: 0    simvastatin (ZOCOR) 40 MG tablet Take 40 mg by mouth at bedtime.      STOP taking these medications     ibuprofen (ADVIL,MOTRIN) 200 MG tablet          DISCHARGE INSTRUCTIONS:    If you experience worsening of your admission symptoms, develop shortness of breath, life threatening emergency, suicidal or homicidal thoughts you must seek medical attention immediately by calling 911 or calling your MD immediately  if symptoms less severe.  You Must read complete instructions/literature along with all the possible adverse reactions/side effects for all the Medicines you take and that have been prescribed  to you. Take any new Medicines after you have completely understood and accept all the possible adverse reactions/side effects.   Please note  You were cared for by a hospitalist during your hospital stay. If you have any questions about your discharge medications or the care you received while you were in the hospital after you are discharged, you can call the unit and asked to speak with the hospitalist on call if the hospitalist that took care of you is not available. Once you are discharged, your primary care physician will handle any further medical issues. Please note that NO REFILLS for any discharge medications will be authorized once you are discharged, as it is imperative that you return to your primary care physician (or establish a relationship with a primary care physician if you do not have one) for your aftercare needs so that they can reassess your need for medications and monitor your lab values.    Today   SUBJECTIVE   Cough sometimes.   VITAL SIGNS:  Blood pressure 159/83, pulse 81, temperature 98.3 F (36.8  C), temperature source Tympanic, resp. rate 22, height 5\' 4"  (1.626 m), weight 329 lb 8 oz (149.46 kg), last menstrual period 08/01/2015, SpO2 94 %.  I/O:   Intake/Output Summary (Last 24 hours) at 08/13/15 1305 Last data filed at 08/13/15 0900  Gross per 24 hour  Intake    240 ml  Output      0 ml  Net    240 ml    PHYSICAL EXAMINATION:  GENERAL:  45 y.o.-year-old patient lying in the bed with no acute distress. Morbidly obese. EYES: Pupils equal, round, reactive to light and accommodation. No scleral icterus. Extraocular muscles intact.  HEENT: Head atraumatic, normocephalic. Oropharynx and nasopharynx clear.  NECK:  Supple, no jugular venous distention. No thyroid enlargement, no tenderness.  LUNGS: Diminished l breath sounds bilaterally, no wheezing, rales,rhonchi or crepitation. No use of accessory muscles of respiration.  CARDIOVASCULAR: S1, S2 normal.  No murmurs, rubs, or gallops.  ABDOMEN: Soft, non-tender, non-distended. Bowel sounds present. No organomegaly or mass.  EXTREMITIES: No pedal edema, cyanosis, or clubbing.  NEUROLOGIC: Cranial nerves II through XII are intact. Muscle strength 4/5 in all extremities. Sensation intact. Gait not checked.  PSYCHIATRIC: The patient is alert and oriented x 3.  SKIN: No obvious rash, lesion, or ulcer.   DATA REVIEW:   CBC  Recent Labs Lab 08/13/15 0521  WBC 4.1  HGB 12.4  HCT 39.0  PLT 359    Chemistries   Recent Labs Lab 08/09/15 1355  08/11/15 0416 08/12/15 0823  NA 133*  < > 134* 136  K 3.5  < > 4.0 3.9  CL 95*  < > 96* 99*  CO2 29  < > 30 30  GLUCOSE 309*  < > 144* 122*  BUN 19  < > 32* 22*  CREATININE 1.39*  < > 1.65* 1.09*  CALCIUM 7.3*  < > 7.7* 8.2*  MG  --   < > 1.8  --   AST 30  --   --   --   ALT 19  --   --   --   ALKPHOS 138*  --   --   --   BILITOT 0.3  --   --   --   < > = values in this interval not displayed.  Cardiac Enzymes  Recent Labs Lab 08/10/15 0141  TROPONINI <0.03    Microbiology Results  Results for orders placed or performed during the hospital encounter of 08/09/15  Urine culture     Status: Abnormal   Collection Time: 08/10/15  4:15 AM  Result Value Ref Range Status   Specimen Description URINE, RANDOM  Final   Special Requests NONE  Final   Culture MULTIPLE SPECIES PRESENT, SUGGEST RECOLLECTION (A)  Final   Report Status 08/11/2015 FINAL  Final    RADIOLOGY:  No results found.      Management plans discussed with the patient, Her husband and they are in agreement.  CODE STATUS:     Code Status Orders        Start     Ordered   08/09/15 1757  Full code   Continuous     08/09/15 1756    Code Status History    Date Active Date Inactive Code Status Order ID Comments User Context   12/31/2014  8:25 PM 01/03/2015  5:49 PM Full Code [id number]  Lytle [place], MD ED   12/15/2014 11:54 AM 12/19/2014  4:57 PM Full Code  [id number]  Srikar Sudini,  MD ED      TOTAL TIME TAKING CARE OF THIS PATIENT:35 minutes.    Demetrios Loll M.D on 08/13/2015 at 1:05 PM  Between 7am to 6pm - Pager - [phone]  After 6pm go to [email or web address] - password [email or web address] [facility]  [place]  Office  [phone]  CC: Primary care physician; Kasandra Knudsen, NP

## 2015-08-13 NOTE — Discharge Instructions (Signed)
Heart Failure Clinic appointment on August 30, 2015 at 10:00am with [name], [place]. Please call [phone] to reschedule.  Heart healthy and ADA diet. Activity as tolerated. HHPT, home O2 [place] 2L.  Take medications as prescribed.  It is very important that you take your antibiotic as advised, until complete, even if you are feeling better.  Please contact your doctor's office if you experience any nausea/vomiting, fever, increased swelling or if you should have any questions or concerns.

## 2015-08-13 NOTE — Progress Notes (Signed)
SATURATION QUALIFICATIONS: (This note is used to comply with regulatory documentation for home oxygen)  Patient Saturations on Room Air at Rest = 91%  Patient Saturations on Room Air while Ambulating = 88%  Patient Saturations on 2 Liters of oxygen while Ambulating = 94%  Please briefly explain why patient needs home oxygen:  CHF Information provided by Administrator, sports at [facility]

## 2015-08-16 ENCOUNTER — Inpatient Hospital Stay: Payer: Medicaid Other

## 2015-08-16 ENCOUNTER — Inpatient Hospital Stay: Payer: Medicaid Other | Admitting: Oncology

## 2015-08-18 ENCOUNTER — Encounter: Payer: Self-pay | Admitting: Respiratory Therapy

## 2015-08-18 DIAGNOSIS — I509 Heart failure, unspecified: Secondary | ICD-10-CM

## 2015-08-18 DIAGNOSIS — J45909 Unspecified asthma, uncomplicated: Secondary | ICD-10-CM

## 2015-08-18 NOTE — Progress Notes (Signed)
Pulmonary Individual Treatment Plan  Patient Details  Name: [name] MRN: [id number] Date of Birth: 01-23-1971 Referring Provider:    Initial Encounter Date: 05/02/2015      Pulmonary Rehab from 05/02/2015 in [facility] Cardiac and Pulmonary Rehab   Date  05/02/15      Visit Diagnosis: CHF, ASTHMA, MORBID OBESITY  [name] Home Medications on Admission:  Current outpatient prescriptions:  .  acetaminophen (TYLENOL) 500 MG tablet, Take 1,000 mg by mouth every 6 (six) hours as needed for mild pain, fever or headache., Disp: , Rfl:  .  albuterol (PROVENTIL HFA;VENTOLIN HFA) 108 (90 BASE) MCG/ACT inhaler, Inhale 2 puffs into the lungs every 6 (six) hours as needed for wheezing or shortness of breath., Disp: 1 Inhaler, Rfl: 0 .  amLODipine (NORVASC) 10 MG tablet, Take 1 tablet (10 mg total) by mouth daily., Disp: 30 tablet, Rfl: 0 .  aspirin EC 81 MG tablet, Take 81 mg by mouth at bedtime., Disp: , Rfl:  .  azithromycin (ZITHROMAX) 500 MG tablet, Take 1 tablet (500 mg total) by mouth daily., Disp: 5 tablet, Rfl: 0 .  budesonide (PULMICORT) 0.25 MG/2ML nebulizer solution, Take 2 mLs (0.25 mg total) by nebulization 2 (two) times daily., Disp: 60 mL, Rfl: 12 .  citalopram (CELEXA) 40 MG tablet, Take 40 mg by mouth daily., Disp: , Rfl:  .  clonazePAM (KLONOPIN) 2 MG tablet, Take 1 mg by mouth at bedtime as needed (for sleep). , Disp: , Rfl:  .  guaiFENesin-dextromethorphan (ROBITUSSIN DM) 100-10 MG/5ML syrup, Take 5 mLs by mouth every 4 (four) hours as needed for cough., Disp: 118 mL, Rfl: 0 .  hydrALAZINE (APRESOLINE) 100 MG tablet, Take 1 tablet (100 mg total) by mouth every 8 (eight) hours., Disp: 60 tablet, Rfl: 0 .  insulin aspart (NOVOLOG FLEXPEN) 100 UNIT/ML FlexPen, Inject 0-12 Units into the skin 3 (three) times daily with meals as needed for high blood sugar. Pt uses as needed per sliding scale., Disp: , Rfl:  .  Insulin Degludec (TRESIBA FLEXTOUCH) 100 UNIT/ML SOPN, Inject 45 Units into  the skin at bedtime., Disp: 1 pen, Rfl: 2 .  ipratropium-albuterol (DUONEB) 0.5-2.5 (3) MG/3ML SOLN, Take 3 mLs by nebulization every 6 (six) hours as needed (for wheezing/shortness of breath)., Disp: , Rfl:  .  losartan (COZAAR) 50 MG tablet, Take 1 tablet (50 mg total) by mouth daily., Disp: 30 tablet, Rfl: 0 .  Metoprolol Tartrate 75 MG TABS, Take 75 mg by mouth 2 (two) times daily., Disp: 60 tablet, Rfl: 0 .  potassium chloride (K-DUR,KLOR-CON) 10 MEQ tablet, Take 1 tablet (10 mEq total) by mouth 2 (two) times daily., Disp: 30 tablet, Rfl: 0 .  simvastatin (ZOCOR) 40 MG tablet, Take 40 mg by mouth at bedtime., Disp: , Rfl:  .  torsemide (DEMADEX) 20 MG tablet, Take 2 tablets (40 mg total) by mouth daily., Disp: 30 tablet, Rfl: 0  Past Medical History: Past Medical History  Diagnosis Date  . Poorly controlled diabetes mellitus ([place])     a. 11/2014 A1c 13.2.  . Iron deficiency anemia   . Asthma   . Hypertension   . Pericardial effusion     a. 11/2014 CT Chest: Mod effusion.  . Diabetes mellitus without complication ([place])   . CHF (congestive heart failure) (HCC)     Tobacco Use: History  Smoking status  . Never Smoker   Smokeless tobacco  . Not on file    Labs: Recent Review Flowsheet Data  Labs for ITP Cardiac and Pulmonary Rehab Latest Ref Rng 12/15/2014 01/03/2015 08/09/2015   Cholestrol 0 - 200 mg/dL - 232(H) -   LDLCALC 0 - 99 mg/dL - 169(H) -   HDL >40 mg/dL - 41 -   Trlycerides <150 mg/dL - 112 -   Hemoglobin A1c 4.0 - 6.0 % 13.2(H) - 10.7(H)   HCO3 21.0 - 28.0 mEq/L - - 36.4(H)   O2SAT - - - 80.3       ADL UCSD:     Pulmonary Assessment Scores      05/02/15 1330       ADL UCSD   ADL Phase Entry     SOB Score total 91     Rest 2     Walk 3     Stairs 5     Bath 4     Dress 3     Shop 3        Pulmonary Function Assessment:     Pulmonary Function Assessment - 05/02/15 1330    Initial Spirometry Results   FVC% 48 %   FEV1% 45 %   FEV1/FVC  Ratio 78.27   Post Bronchodilator Spirometry Results   FVC% 52 %   FEV1% 45 %   FEV1/FVC Ratio 75.3   Breath   Bilateral Breath Sounds Clear   Shortness of Breath Yes;Fear of Shortness of Breath;Limiting activity      Exercise Target Goals:    Exercise Program Goal: Individual exercise prescription set with THRR, safety & activity barriers. Participant demonstrates ability to understand and report RPE using BORG scale, to self-measure pulse accurately, and to acknowledge the importance of the exercise prescription.  Exercise Prescription Goal: Starting with aerobic activity 30 plus minutes a day, 3 days per week for initial exercise prescription. Provide home exercise prescription and guidelines that participant acknowledges understanding prior to discharge.  Activity Barriers & Risk Stratification:     Activity Barriers & Cardiac Risk Stratification - 05/02/15 1330    Activity Barriers & Cardiac Risk Stratification   Activity Barriers Back Problems;Shortness of Breath;Deconditioning;Muscular Weakness   Cardiac Risk Stratification Moderate      6 Minute Walk:     6 Minute Walk      05/02/15 1645       6 Minute Walk   Phase Initial     Distance 555 feet     Walk Time 4 minutes     # of Rest Breaks 3     RPE 15     Perceived Dyspnea  4     Symptoms No     Resting HR 76 bpm     Resting BP 126/76 mmHg     Max Ex. HR 92 bpm     Max Ex. BP 144/78 mmHg        Initial Exercise Prescription:     Initial Exercise Prescription - 05/02/15 1600    Date of Initial Exercise RX and Referring Provider   Date 05/02/15   Treadmill   MPH 1.5   Grade 0   Minutes 10   Recumbant Bike   Level 2   RPM 40   Watts 20   Minutes 10   NuStep   Level 2   Watts 40   Minutes 10   Arm Ergometer   Level 1   Watts 10   Minutes 10   Recumbant Elliptical   Level 2   RPM 40   Watts 20   Minutes 10  REL-XR   Level 2   Watts 40   Minutes 10   T5 Nustep   Level 1   Watts  15   Minutes 10   Biostep-RELP   Level 2   Watts 40   Minutes 10   Prescription Details   Frequency (times per week) 3   Duration Progress to 30 minutes of continuous aerobic without signs/symptoms of physical distress   Intensity   THRR REST +  30   Ratings of Perceived Exertion 11-15   Perceived Dyspnea 0-4   Progression   Progression Continue progressive overload as per policy without signs/symptoms or physical distress.   Resistance Training   Training Prescription Yes   Weight 1   Reps 10-15      Perform Capillary Blood Glucose checks as needed.  Exercise Prescription Changes:   Exercise Comments:   Discharge Exercise Prescription (Final Exercise Prescription Changes):    Nutrition:  Target Goals: Understanding of nutrition guidelines, daily intake of sodium 1500mg , cholesterol 200mg , calories 30% from fat and 7% or less from saturated fats, daily to have 5 or more servings of fruits and vegetables.  Biometrics:    Nutrition Therapy Plan and Nutrition Goals:     Nutrition Therapy & Goals - 05/02/15 1330    Nutrition Therapy   Diet Ms Engelhardt is diabetic and has meet with a dietitian about 2 years ago. She would like to meet with them again. Her husband does the cooking and is healthy with low salt. Ms Frizzell drinks 5-6 hospital cups of water a day.       Nutrition Discharge: Rate Your Plate Scores:   Psychosocial: Target Goals: Acknowledge presence or absence of depression, maximize coping skills, provide positive support system. Participant is able to verbalize types and ability to use techniques and skills needed for reducing stress and depression.  Initial Review & Psychosocial Screening:     Initial Psych Review & Screening - 05/02/15 [place]? Yes   Comments Ms Howe had good family support from her husband and children. She is anxious to feel better and be more active since her 2 admissions for CHF last  October 2016.   Barriers   Psychosocial barriers to participate in program The patient should benefit from training in stress management and relaxation.   Screening Interventions   Interventions Encouraged to exercise;Program counselor consult      Quality of Life Scores:     Quality of Life - 05/02/15 1330    Quality of Life Scores   Health/Function Pre 10.84 %   Socioeconomic Pre 11.86 %   Psych/Spiritual Pre 13.93 %   Family Pre 16.8 %   GLOBAL Pre 12.51 %      PHQ-9:     Recent Review Flowsheet Data    Depression screen [facility] 2/9 05/02/2015   Decreased Interest 2   Down, Depressed, Hopeless 1   PHQ - 2 Score 3   Altered sleeping 2   Tired, decreased energy 3   Change in appetite 1   Feeling bad or failure about yourself  2   Trouble concentrating 2   Moving slowly or fidgety/restless 1   Suicidal thoughts 1   PHQ-9 Score 15   Difficult doing work/chores Somewhat difficult      Psychosocial Evaluation and Intervention:   Psychosocial Re-Evaluation:  Education: Education Goals: Education classes will be provided on a weekly basis, covering required topics. Participant will state understanding/return  demonstration of topics presented.  Learning Barriers/Preferences:     Learning Barriers/Preferences - 05/02/15 1330    Learning Barriers/Preferences   Learning Barriers None   Learning Preferences Group Instruction;Individual Instruction;Pictoral;Skilled Demonstration;Verbal Instruction;Video;Written Material      Education Topics: Initial Evaluation Education: - Verbal, written and demonstration of respiratory meds, RPE/PD scales, oximetry and breathing techniques. Instruction on use of nebulizers and MDIs: cleaning and proper use, rinsing mouth with steroid doses and importance of monitoring MDI activations.          Pulmonary Rehab from 05/02/2015 in [facility] Cardiac and Pulmonary Rehab   Date  05/02/15   Educator  LB   Instruction Review Code  2- meets  goals/outcomes      General Nutrition Guidelines/Fats and Fiber: -Group instruction provided by verbal, written material, models and posters to present the general guidelines for heart healthy nutrition. Gives an explanation and review of dietary fats and fiber.   Controlling Sodium/Reading Food Labels: -Group verbal and written material supporting the discussion of sodium use in heart healthy nutrition. Review and explanation with models, verbal and written materials for utilization of the food label.   Exercise Physiology & Risk Factors: - Group verbal and written instruction with models to review the exercise physiology of the cardiovascular system and associated critical values. Details cardiovascular disease risk factors and the goals associated with each risk factor.   Aerobic Exercise & Resistance Training: - Gives group verbal and written discussion on the health impact of inactivity. On the components of aerobic and resistive training programs and the benefits of this training and how to safely progress through these programs.   Flexibility, Balance, General Exercise Guidelines: - Provides group verbal and written instruction on the benefits of flexibility and balance training programs. Provides general exercise guidelines with specific guidelines to those with heart or lung disease. Demonstration and skill practice provided.   Stress Management: - Provides group verbal and written instruction about the health risks of elevated stress, cause of high stress, and healthy ways to reduce stress.   Depression: - Provides group verbal and written instruction on the correlation between heart/lung disease and depressed mood, treatment options, and the stigmas associated with seeking treatment.   Exercise & Equipment Safety: - Individual verbal instruction and demonstration of equipment use and safety with use of the equipment.   Infection Prevention: - Provides verbal and  written material to individual with discussion of infection control including proper hand washing and proper equipment cleaning during exercise session.   Falls Prevention: - Provides verbal and written material to individual with discussion of falls prevention and safety.      Pulmonary Rehab from 05/02/2015 in [facility] Cardiac and Pulmonary Rehab   Date  05/02/15   Educator  LB   Instruction Review Code  2- meets goals/outcomes      Diabetes: - Individual verbal and written instruction to review signs/symptoms of diabetes, desired ranges of glucose level fasting, after meals and with exercise. Advice that pre and post exercise glucose checks will be done for 3 sessions at entry of program.   Chronic Lung Diseases: - Group verbal and written instruction to review new updates, new respiratory medications, new advancements in procedures and treatments. Provide informative websites and "800" numbers of self-education.   Lung Procedures: - Group verbal and written instruction to describe testing methods done to diagnose lung disease. Review the outcome of test results. Describe the treatment choices: Pulmonary Function Tests, ABGs and oximetry.   Energy Conservation: -  Provide group verbal and written instruction for methods to conserve energy, plan and organize activities. Instruct on pacing techniques, use of adaptive equipment and posture/positioning to relieve shortness of breath.   Triggers: - Group verbal and written instruction to review types of environmental controls: home humidity, furnaces, filters, dust mite/pet prevention, HEPA vacuums. To discuss weather changes, air quality and the benefits of nasal washing.   Exacerbations: - Group verbal and written instruction to provide: warning signs, infection symptoms, calling MD promptly, preventive modes, and value of vaccinations. Review: effective airway clearance, coughing and/or vibration techniques. Create an Advertising copywriter.   Oxygen: - Individual and group verbal and written instruction on oxygen therapy. Includes supplement oxygen, available portable oxygen systems, continuous and intermittent flow rates, oxygen safety, concentrators, and Medicare reimbursement for oxygen.   Respiratory Medications: - Group verbal and written instruction to review medications for lung disease. Drug class, frequency, complications, importance of spacers, rinsing mouth after steroid MDI's, and proper cleaning methods for nebulizers.      Pulmonary Rehab from 05/02/2015 in [facility] Cardiac and Pulmonary Rehab   Date  05/02/15   Educator  LB   Instruction Review Code  2- meets goals/outcomes      AED/CPR: - Group verbal and written instruction with the use of models to demonstrate the basic use of the AED with the basic ABC's of resuscitation.   Breathing Retraining: - Provides individuals verbal and written instruction on purpose, frequency, and proper technique of diaphragmatic breathing and pursed-lipped breathing. Applies individual practice skills.      Pulmonary Rehab from 05/02/2015 in [facility] Cardiac and Pulmonary Rehab   Date  05/02/15   Educator  LB   Instruction Review Code  2- meets goals/outcomes      Anatomy and Physiology of the Lungs: - Group verbal and written instruction with the use of models to provide basic lung anatomy and physiology related to function, structure and complications of lung disease.   Heart Failure: - Group verbal and written instruction on the basics of heart failure: signs/symptoms, treatments, explanation of ejection fraction, enlarged heart and cardiomyopathy.   Sleep Apnea: - Individual verbal and written instruction to review Obstructive Sleep Apnea. Review of risk factors, methods for diagnosing and types of masks and machines for OSA.   Anxiety: - Provides group, verbal and written instruction on the correlation between heart/lung disease and anxiety, treatment options, and  management of anxiety.   Relaxation: - Provides group, verbal and written instruction about the benefits of relaxation for patients with heart/lung disease. Also provides patients with examples of relaxation techniques.   Knowledge Questionnaire Score:     Knowledge Questionnaire Score - 05/02/15 1330    Knowledge Questionnaire Score   Pre Score 10/10       Core Components/Risk Factors/Patient Goals at Admission:     Personal Goals and Risk Factors at Admission - 05/02/15 1330    Core Components/Risk Factors/Patient Goals on Admission    Weight Management Yes;Obesity   Intervention Weight Management: Develop a combined nutrition and exercise program designed to reach desired caloric intake, while maintaining appropriate intake of nutrient and fiber, sodium and fats, and appropriate energy expenditure required for the weight goal.;Weight Management: Provide education and appropriate resources to help participant work on and attain dietary goals.;Weight Management/Obesity: Establish reasonable short term and long term weight goals.;Obesity: Provide education and appropriate resources to help participant work on and attain dietary goals.   Admit Weight 345 lb 3.2 oz (156.582 kg)  Goal Weight: Short Term 300 lb (136.079 kg)   Goal Weight: Long Term 220 lb (99.791 kg)   Expected Outcomes Short Term: Continue to assess and modify interventions until short term weight is achieved.;Long Term: Adherence to nutrition and physical activity/exercise program aimed toward attainment of established weight goal.   Sedentary Yes  Ms Kastens likes to walk at the mall.   Intervention Provide advice, education, support and counseling about physical activity/exercise needs.;Develop an individualized exercise prescription for aerobic and resistive training based on initial evaluation findings, risk stratification, comorbidities and participant's personal goals.   Expected Outcomes Achievement of increased  cardiorespiratory fitness and enhanced flexibility, muscular endurance and strength shown through measurements of functional capaciy and personal statement of participant.   Improve shortness of breath with ADL's Yes  Ms Graydon had a high score on her SOB questionnaire and does have fear of shortness of breath.   Intervention Provide education, individualized exercise plan and daily activity instruction to help decrease symptoms of SOB with activities of daily living.   Expected Outcomes Short Term: Achieves a reduction of symptoms when performing activities of daily living.   Develop more efficient breathing techniques such as purse lipped breathing and diaphragmatic breathing; and practicing self-pacing with activity Yes   Intervention Provide education, demonstration and support about specific breathing techniuqes utilized for more efficient breathing. Include techniques such as pursed lipped breathing, diaphragmatic breathing and self-pacing activity.   Expected Outcomes Short Term: Participant will be able to demonstrate and use breathing techniques as needed throughout daily activities.   Increase knowledge of respiratory medications and ability to use respiratory devices properly  Yes   Intervention Provide education and demonstration as needed of appropriate use of medications, inhalers, and oxygen therapy.  Ms Tibbetts uses Duoneb and Pulmicort SVN; and ProAir MDI; spacer was given with instructions.   Expected Outcomes Short Term: Achieves understanding of medications use. Understands that oxygen is a medication prescribed by physician. Demonstrates appropriate use of inhaler and oxygen therapy.   Diabetes Yes   Intervention Provide education about signs/symptoms and action to take for hypo/hyperglycemia.;Provide education about proper nutrition, including hydration, and aerobic/resistive exercise prescription along with prescribed medications to achieve blood glucose in normal ranges: Fasting  glucose 65-99 mg/dL   Expected Outcomes Short Term: Participant verbalizes understanding of the signs/symptoms and immediate care of hyper/hypoglycemia, proper foot care and importance of medication, aerobic/resistive exercise and nutrition plan for blood glucose control.;Long Term: Attainment of HbA1C < 7%.   Hypertension Yes   Intervention Provide education on lifestyle modifcations including regular physical activity/exercise, weight management, moderate sodium restriction and increased consumption of fresh fruit, vegetables, and low fat dairy, alcohol moderation, and smoking cessation.;Monitor prescription use compliance.   Expected Outcomes Short Term: Continued assessment and intervention until BP is < 140/32mm HG in hypertensive participants. < 130/51mm HG in hypertensive participants with diabetes, heart failure or chronic kidney disease.;Long Term: Maintenance of blood pressure at goal levels.      Core Components/Risk Factors/Patient Goals Review:    Core Components/Risk Factors/Patient Goals at Discharge (Final Review):    ITP Comments:     ITP Comments      07/11/15 0716           ITP Comments Called Ms Schwaller 07/10/15. She has not attended since her initial evaluation on 04/29/15. Ms Alvy Bimler does plan to start on 07/12/15.          Comments: Ms. Henjum came for her evaluation for Pulmonary Rehab on 05/02/2015.  She has not returned.  She was very nervous about exercising with CHF.  We have called her several times and she has not returned our phone calls.  She will be discharged from [place] at this time and can be referred in the future if she decides this is what she would like to do. Thank you for the referral of your patient.

## 2015-08-30 ENCOUNTER — Encounter: Payer: Self-pay | Admitting: Family

## 2015-08-30 ENCOUNTER — Ambulatory Visit: Payer: Medicaid Other | Attending: Family | Admitting: Family

## 2015-08-30 VITALS — BP 150/82 | HR 85 | Resp 18 | Ht 64.0 in | Wt 325.0 lb

## 2015-08-30 DIAGNOSIS — R05 Cough: Secondary | ICD-10-CM | POA: Diagnosis not present

## 2015-08-30 DIAGNOSIS — R600 Localized edema: Secondary | ICD-10-CM | POA: Insufficient documentation

## 2015-08-30 DIAGNOSIS — E1165 Type 2 diabetes mellitus with hyperglycemia: Secondary | ICD-10-CM

## 2015-08-30 DIAGNOSIS — I110 Hypertensive heart disease with heart failure: Secondary | ICD-10-CM | POA: Insufficient documentation

## 2015-08-30 DIAGNOSIS — D509 Iron deficiency anemia, unspecified: Secondary | ICD-10-CM | POA: Insufficient documentation

## 2015-08-30 DIAGNOSIS — E119 Type 2 diabetes mellitus without complications: Secondary | ICD-10-CM | POA: Insufficient documentation

## 2015-08-30 DIAGNOSIS — R0602 Shortness of breath: Secondary | ICD-10-CM | POA: Insufficient documentation

## 2015-08-30 DIAGNOSIS — Z7982 Long term (current) use of aspirin: Secondary | ICD-10-CM | POA: Insufficient documentation

## 2015-08-30 DIAGNOSIS — R5383 Other fatigue: Secondary | ICD-10-CM | POA: Diagnosis not present

## 2015-08-30 DIAGNOSIS — J45909 Unspecified asthma, uncomplicated: Secondary | ICD-10-CM | POA: Diagnosis not present

## 2015-08-30 DIAGNOSIS — Z833 Family history of diabetes mellitus: Secondary | ICD-10-CM | POA: Diagnosis not present

## 2015-08-30 DIAGNOSIS — R140 Abdominal distension (gaseous): Secondary | ICD-10-CM | POA: Diagnosis not present

## 2015-08-30 DIAGNOSIS — Z794 Long term (current) use of insulin: Secondary | ICD-10-CM | POA: Insufficient documentation

## 2015-08-30 DIAGNOSIS — I10 Essential (primary) hypertension: Secondary | ICD-10-CM

## 2015-08-30 DIAGNOSIS — J4530 Mild persistent asthma, uncomplicated: Secondary | ICD-10-CM

## 2015-08-30 DIAGNOSIS — Z882 Allergy status to sulfonamides status: Secondary | ICD-10-CM | POA: Insufficient documentation

## 2015-08-30 DIAGNOSIS — I5032 Chronic diastolic (congestive) heart failure: Secondary | ICD-10-CM | POA: Insufficient documentation

## 2015-08-30 DIAGNOSIS — M549 Dorsalgia, unspecified: Secondary | ICD-10-CM | POA: Insufficient documentation

## 2015-08-30 DIAGNOSIS — Z888 Allergy status to other drugs, medicaments and biological substances status: Secondary | ICD-10-CM | POA: Insufficient documentation

## 2015-08-30 DIAGNOSIS — Z8249 Family history of ischemic heart disease and other diseases of the circulatory system: Secondary | ICD-10-CM | POA: Insufficient documentation

## 2015-08-30 NOTE — Progress Notes (Signed)
Subjective:    Patient ID: [name], female    DOB: [birth year], 44 y.o.   MRN: [id number]  Congestive Heart Failure Presents for initial visit. The disease course has been improving. Associated symptoms include edema, fatigue, palpitations and shortness of breath ("getting better"). Pertinent negatives include no abdominal pain, chest pain, chest pressure or orthopnea. The symptoms have been improving. Past treatments include oxygen, salt and fluid restriction, beta blockers and angiotensin receptor blockers. The treatment provided moderate relief. Compliance with prior treatments has been good. Her past medical history is significant for anemia, chronic lung disease, DM and HTN. There is no history of CVA.  Hypertension This is a chronic problem. The current episode started more than 1 year ago. The problem has been waxing and waning since onset. Associated symptoms include palpitations, peripheral edema and shortness of breath ("getting better"). Pertinent negatives include no chest pain or neck pain. There are no associated agents to hypertension. Risk factors for coronary artery disease include diabetes mellitus, obesity and sedentary lifestyle. Past treatments include beta blockers, angiotensin blockers, diuretics, lifestyle changes and calcium channel blockers. The current treatment provides moderate improvement. Compliance problems include exercise.  Hypertensive end-organ damage includes heart failure. There is no history of CVA.   Past Medical History  Diagnosis Date  . Poorly controlled diabetes mellitus ([place])     a. 11/2014 A1c 13.2.  . Iron deficiency anemia   . Asthma   . Hypertension   . Pericardial effusion     a. 11/2014 CT Chest: Mod effusion.  . Diabetes mellitus without complication ([place])   . CHF (congestive heart failure) [facility])     Past Surgical History  Procedure Laterality Date  . Cesarean section      Family History  Problem Relation Age of Onset  .  Hypertension    . Diabetes    . Diabetes Mother   . Hypertension Mother   . Hypertension Father   . Diabetes Father     Social History  Substance Use Topics  . Smoking status: Never Smoker   . Smokeless tobacco: Never Used  . Alcohol Use: 0.0 oz/week    0 Standard drinks or equivalent per week    Allergies  Allergen Reactions  . Feraheme [Ferumoxytol] Itching  . Sulfa Antibiotics Itching    Prior to Admission medications   Medication Sig Start Date End Date Taking? Authorizing Provider  acetaminophen (TYLENOL) 500 MG tablet Take 1,000 mg by mouth every 6 (six) hours as needed for mild pain, fever or headache.   Yes Historical Provider, MD  albuterol (PROVENTIL HFA;VENTOLIN HFA) 108 (90 BASE) MCG/ACT inhaler Inhale 2 puffs into the lungs every 6 (six) hours as needed for wheezing or shortness of breath. 12/19/14  Yes Dustin Flock, MD  amLODipine (NORVASC) 10 MG tablet Take 1 tablet (10 mg total) by mouth daily. 12/19/14  Yes Dustin Flock, MD  aspirin EC 81 MG tablet Take 81 mg by mouth at bedtime.   Yes Historical Provider, MD  budesonide (PULMICORT) 0.25 MG/2ML nebulizer solution Take 2 mLs (0.25 mg total) by nebulization 2 (two) times daily. 12/19/14  Yes Dustin Flock, MD  citalopram (CELEXA) 40 MG tablet Take 40 mg by mouth daily.   Yes Historical Provider, MD  clonazePAM (KLONOPIN) 2 MG tablet Take 1 mg by mouth at bedtime as needed (for sleep).    Yes Historical Provider, MD  guaiFENesin-dextromethorphan (ROBITUSSIN DM) 100-10 MG/5ML syrup Take 5 mLs by mouth every 4 (four) hours as needed  for cough. 08/13/15  Yes Demetrios Loll, MD  hydrALAZINE (APRESOLINE) 100 MG tablet Take 1 tablet (100 mg total) by mouth every 8 (eight) hours. 12/19/14  Yes Dustin Flock, MD  insulin aspart (NOVOLOG FLEXPEN) 100 UNIT/ML FlexPen Inject 0-12 Units into the skin 3 (three) times daily with meals as needed for high blood sugar. Pt uses as needed per sliding scale.   Yes Historical Provider, MD   Insulin Degludec (TRESIBA FLEXTOUCH) 100 UNIT/ML SOPN Inject 45 Units into the skin at bedtime. 08/13/15  Yes Demetrios Loll, MD  ipratropium-albuterol (DUONEB) 0.5-2.5 (3) MG/3ML SOLN Take 3 mLs by nebulization every 6 (six) hours as needed (for wheezing/shortness of breath).   Yes Historical Provider, MD  losartan (COZAAR) 50 MG tablet Take 1 tablet (50 mg total) by mouth daily. 01/03/15  Yes Dustin Flock, MD  Metoprolol Tartrate 75 MG TABS Take 75 mg by mouth 2 (two) times daily. Patient taking differently: Take 50 mg by mouth 2 (two) times daily.  12/19/14  Yes Dustin Flock, MD  potassium chloride (K-DUR,KLOR-CON) 10 MEQ tablet Take 1 tablet (10 mEq total) by mouth 2 (two) times daily. 12/19/14  Yes Dustin Flock, MD  simvastatin (ZOCOR) 40 MG tablet Take 40 mg by mouth at bedtime.   Yes Historical Provider, MD  torsemide (DEMADEX) 20 MG tablet Take 2 tablets (40 mg total) by mouth daily. 08/13/15  Yes Demetrios Loll, MD      Review of Systems  Constitutional: Positive for fatigue. Negative for appetite change.  HENT: Positive for congestion and rhinorrhea. Negative for sore throat.   Eyes: Negative.   Respiratory: Positive for cough and shortness of breath ("getting better"). Negative for chest tightness.   Cardiovascular: Positive for palpitations and leg swelling ("getting better"). Negative for chest pain.  Gastrointestinal: Positive for abdominal distention. Negative for abdominal pain.  Endocrine: Negative.   Genitourinary: Negative.   Musculoskeletal: Positive for back pain and arthralgias (both legs). Negative for neck pain.  Skin: Negative.   Allergic/Immunologic: Negative.   Neurological: Positive for numbness ("burning/tingling in feet"). Negative for dizziness and light-headedness.  Hematological: Negative for adenopathy. Does not bruise/bleed easily.  Psychiatric/Behavioral: Negative for sleep disturbance (sleeping on 1 pillow with oxygen at 2L) and dysphoric mood. The patient is  nervous/anxious.        Objective:   Physical Exam  Constitutional: She is oriented to person, place, and time. She appears well-developed and well-nourished.  HENT:  Head: Normocephalic and atraumatic.  Eyes: Conjunctivae are normal. Pupils are equal, round, and reactive to light.  Neck: Normal range of motion. Neck supple.  Cardiovascular: Normal rate and regular rhythm.   Pulmonary/Chest: Effort normal. She has no wheezes. She has no rales.  Abdominal: Soft. She exhibits no distension. There is no tenderness.  Musculoskeletal: She exhibits edema (trace amount pitting edema in bilateral lower legs). She exhibits no tenderness.  Neurological: She is alert and oriented to person, place, and time.  Skin: Skin is warm and dry.  Psychiatric: She has a normal mood and affect. Her behavior is normal. Thought content normal.  Nursing note and vitals reviewed.  BP 150/82 mmHg  Pulse 85  Resp 18  Ht 5\' 4"  (1.626 m)  Wt 325 lb (147.419 kg)  BMI 55.76 kg/m2  SpO2 96%  LMP 08/01/2015       Assessment & Plan:  1: Chronic heart failure with preserved ejection fraction- Patient presents with fatigue and shortness of breath upon moderate exertion (Class II). She feels like both  symptoms are slowly improving. She continues to wear her oxygen at 2L around the clock which she feels like has helped considerably. She is already weighing herself daily and says that she's noticed a gradual decline in her weight. Enforced the importance of calling for an overnight weight gain of >2 pounds or a weekly weight gain of >5 pounds. She is not adding any salt to her food and has been reading food labels. Discussed the importance of staying under 2000mg  sodium daily. Low sodium cookbook was given to the patient. She has [facility] coming out twice weekly. [facility] PharmD went in and reviewed medications with the patient.  2: HTN- Blood pressure mildly elevated today but she says that she hasn't taken her  medications yet today. She will take them upon returning home. 3: Diabetes- Glucose this morning was 268 and she's unsure why it was so high. Currently using novolog with meals along with tresiba at bedtime. PCP is following her for this and her plan is to go see an endocrinologist in the future. 4: Asthma- No wheezing heard today and patient says that she continues to cough at times. Wearing her oxygen at 2L around the clock. Has her nebulizer and albuterol that she can use as needed. Had gotten established with [facility] and went for her initial appointment but then never returned because she wasn't feeling good. She is planning on calling them back to get started.   Patient did not bring her medications nor a list. Each medication was verbally reviewed with the patient and she was encouraged to bring the bottles to every visit to confirm accuracy of list.  Return in 1 month or sooner for any questions/problems before then.

## 2015-08-30 NOTE — Patient Instructions (Signed)
Continue weighing daily and call for an overnight weight gain of > 2 pounds or a weekly weight gain of >5 pounds. 

## 2015-09-04 ENCOUNTER — Inpatient Hospital Stay: Payer: Medicaid Other

## 2015-09-04 ENCOUNTER — Inpatient Hospital Stay: Payer: Medicaid Other | Attending: Oncology | Admitting: Oncology

## 2015-09-04 VITALS — BP 127/84 | HR 82 | Temp 98.4°F | Wt 334.7 lb

## 2015-09-04 DIAGNOSIS — Z79899 Other long term (current) drug therapy: Secondary | ICD-10-CM

## 2015-09-04 DIAGNOSIS — I509 Heart failure, unspecified: Secondary | ICD-10-CM | POA: Diagnosis not present

## 2015-09-04 DIAGNOSIS — Z9981 Dependence on supplemental oxygen: Secondary | ICD-10-CM | POA: Diagnosis not present

## 2015-09-04 DIAGNOSIS — D509 Iron deficiency anemia, unspecified: Secondary | ICD-10-CM | POA: Insufficient documentation

## 2015-09-04 DIAGNOSIS — R531 Weakness: Secondary | ICD-10-CM | POA: Diagnosis not present

## 2015-09-04 DIAGNOSIS — R0602 Shortness of breath: Secondary | ICD-10-CM | POA: Diagnosis not present

## 2015-09-04 DIAGNOSIS — Z7982 Long term (current) use of aspirin: Secondary | ICD-10-CM | POA: Insufficient documentation

## 2015-09-04 DIAGNOSIS — E1165 Type 2 diabetes mellitus with hyperglycemia: Secondary | ICD-10-CM

## 2015-09-04 DIAGNOSIS — I10 Essential (primary) hypertension: Secondary | ICD-10-CM | POA: Insufficient documentation

## 2015-09-04 DIAGNOSIS — R5383 Other fatigue: Secondary | ICD-10-CM | POA: Insufficient documentation

## 2015-09-04 DIAGNOSIS — Z794 Long term (current) use of insulin: Secondary | ICD-10-CM | POA: Insufficient documentation

## 2015-09-04 DIAGNOSIS — I313 Pericardial effusion (noninflammatory): Secondary | ICD-10-CM

## 2015-09-04 LAB — CBC WITH DIFFERENTIAL/PLATELET
BASOS ABS: 0 10*3/uL (ref 0–0.1)
BASOS PCT: 0 %
EOS PCT: 3 %
Eosinophils Absolute: 0.2 10*3/uL (ref 0–0.7)
HCT: 36.5 % (ref 35.0–47.0)
Hemoglobin: 12 g/dL (ref 12.0–16.0)
Lymphocytes Relative: 17 %
Lymphs Abs: 1.1 10*3/uL (ref 1.0–3.6)
MCH: 27 pg (ref 26.0–34.0)
MCHC: 32.8 g/dL (ref 32.0–36.0)
MCV: 82.3 fL (ref 80.0–100.0)
MONO ABS: 0.5 10*3/uL (ref 0.2–0.9)
Monocytes Relative: 8 %
NEUTROS ABS: 4.4 10*3/uL (ref 1.4–6.5)
Neutrophils Relative %: 72 %
PLATELETS: 301 10*3/uL (ref 150–440)
RBC: 4.43 MIL/uL (ref 3.80–5.20)
RDW: 13.7 % (ref 11.5–14.5)
WBC: 6.2 10*3/uL (ref 3.6–11.0)

## 2015-09-04 LAB — IRON AND TIBC
IRON: 83 ug/dL (ref 28–170)
SATURATION RATIOS: 29 % (ref 10.4–31.8)
TIBC: 287 ug/dL (ref 250–450)
UIBC: 204 ug/dL

## 2015-09-04 LAB — FERRITIN: FERRITIN: 8 ng/mL — AB (ref 11–307)

## 2015-09-04 NOTE — Progress Notes (Signed)
Patient ambulates via wheelchair, brought to exam room 8.  Patient states she was hospitalized around father's day for a week and placed on 2L oxygen.  Patient denies pain or discomfort at this time. O2 96% on room air, Vitals documented.  Medication record updated information provided by patient .

## 2015-09-04 NOTE — Progress Notes (Signed)
[place]  [phone] [phone]  ID: [name] OB: 03-15-1970  MR#: [id number]  [id number]  Patient Care Team: Kasandra Knudsen, NP as PCP - General (Nurse Practitioner) Minna Merritts, MD as Consulting Physician (Cardiology) Alisa Graff, FNP as Nurse Practitioner (Family Medicine)  CHIEF COMPLAINT: Iron deficiency anemia.  INTERVAL HISTORY: Patient returns to clinic today for repeat laboratory work and further evaluation. She was recently admitted to the hospital and placed on 24-hour oxygen. She feels improved since her admission. She continues to have persistent weakness and fatigue. She denies any fevers. She has no neurologic complaints. She has no chest pain, cough, or hemoptysis. She denies any nausea, vomiting, consultation, or diarrhea. She has no melena or hematochezia. Patient otherwise feels well and offers no further specific complaints.  REVIEW OF SYSTEMS:   Review of Systems  Constitutional: Negative for fever, weight loss and malaise/fatigue.  Respiratory: Positive for shortness of breath. Negative for hemoptysis and sputum production.   Cardiovascular: Negative.  Negative for chest pain.  Gastrointestinal: Negative.  Negative for blood in stool and melena.  Musculoskeletal: Negative.   Neurological: Negative.  Negative for weakness.  Psychiatric/Behavioral: Negative.     As per HPI. Otherwise, a complete review of systems is negatve.  PAST MEDICAL HISTORY: Past Medical History  Diagnosis Date  . Poorly controlled diabetes mellitus ([place])     a. 11/2014 A1c 13.2.  . Iron deficiency anemia   . Asthma   . Hypertension   . Pericardial effusion     a. 11/2014 CT Chest: Mod effusion.  . Diabetes mellitus without complication ([place])   . CHF (congestive heart failure) ([place])     PAST SURGICAL HISTORY: Past Surgical History  Procedure Laterality Date  . Cesarean section      FAMILY HISTORY Family History    Problem Relation Age of Onset  . Hypertension    . Diabetes    . Diabetes Mother   . Hypertension Mother   . Hypertension Father   . Diabetes Father        ADVANCED DIRECTIVES:    HEALTH MAINTENANCE: Social History  Substance Use Topics  . Smoking status: Never Smoker   . Smokeless tobacco: Never Used  . Alcohol Use: 0.0 oz/week    0 Standard drinks or equivalent per week     Colonoscopy:  PAP:  Bone density:  Lipid panel:  Allergies  Allergen Reactions  . Feraheme [Ferumoxytol] Itching  . Sulfa Antibiotics Itching    Current Outpatient Prescriptions  Medication Sig Dispense Refill  . acetaminophen (TYLENOL) 500 MG tablet Take 1,000 mg by mouth every 6 (six) hours as needed for mild pain, fever or headache.    . albuterol (PROVENTIL HFA;VENTOLIN HFA) 108 (90 BASE) MCG/ACT inhaler Inhale 2 puffs into the lungs every 6 (six) hours as needed for wheezing or shortness of breath. 1 Inhaler 0  . amLODipine (NORVASC) 10 MG tablet Take 1 tablet (10 mg total) by mouth daily. 30 tablet 0  . aspirin EC 81 MG tablet Take 81 mg by mouth at bedtime.    . budesonide (PULMICORT) 0.25 MG/2ML nebulizer solution Take 2 mLs (0.25 mg total) by nebulization 2 (two) times daily. 60 mL 12  . citalopram (CELEXA) 40 MG tablet Take 40 mg by mouth daily.    . clonazePAM (KLONOPIN) 2 MG tablet Take 1 mg by mouth at bedtime as needed (for sleep).     [name] guaiFENesin-dextromethorphan (ROBITUSSIN DM) 100-10 MG/5ML syrup Take  5 mLs by mouth every 4 (four) hours as needed for cough. 118 mL 0  . hydrALAZINE (APRESOLINE) 100 MG tablet Take 1 tablet (100 mg total) by mouth every 8 (eight) hours. 60 tablet 0  . insulin aspart (NOVOLOG FLEXPEN) 100 UNIT/ML FlexPen Inject 0-12 Units into the skin 3 (three) times daily with meals as needed for high blood sugar. Pt uses as needed per sliding scale.    . Insulin Degludec (TRESIBA FLEXTOUCH) 100 UNIT/ML SOPN Inject 45 Units into the skin at bedtime. 1 pen 2  .  ipratropium-albuterol (DUONEB) 0.5-2.5 (3) MG/3ML SOLN Take 3 mLs by nebulization every 6 (six) hours as needed (for wheezing/shortness of breath).    . losartan (COZAAR) 50 MG tablet Take 1 tablet (50 mg total) by mouth daily. 30 tablet 0  . Metoprolol Tartrate 75 MG TABS Take 75 mg by mouth 2 (two) times daily. (Patient taking differently: Take 50 mg by mouth 2 (two) times daily. ) 60 tablet 0  . potassium chloride (K-DUR,KLOR-CON) 10 MEQ tablet Take 1 tablet (10 mEq total) by mouth 2 (two) times daily. 30 tablet 0  . simvastatin (ZOCOR) 40 MG tablet Take 40 mg by mouth at bedtime.    . torsemide (DEMADEX) 20 MG tablet Take 2 tablets (40 mg total) by mouth daily. 30 tablet 0   No current facility-administered medications for this visit.    OBJECTIVE: Filed Vitals:   09/04/15 1434  BP: 127/84  Pulse: 82  Temp: 98.4 F (36.9 C)     Body mass index is 57.42 kg/(m^2).    ECOG FS:1 - Symptomatic but completely ambulatory  General: Well-developed, well-nourished, no acute distress. Eyes: Pink conjunctiva, anicteric sclera. Lungs: Clear to auscultation bilaterally. Heart: Regular rate and rhythm. No rubs, murmurs, or gallops. Abdomen: Soft, nontender, nondistended. No organomegaly noted, normoactive bowel sounds. Musculoskeletal: No edema, cyanosis, or clubbing. Neuro: Alert, answering all questions appropriately. Cranial nerves grossly intact. Skin: No rashes or petechiae noted. Psych: Normal affect.   LAB RESULTS:  Lab Results  Component Value Date   NA 136 08/12/2015   K 3.9 08/12/2015   CL 99* 08/12/2015   CO2 30 08/12/2015   GLUCOSE 122* 08/12/2015   BUN 22* 08/12/2015   CREATININE 1.09* 08/12/2015   CALCIUM 8.2* 08/12/2015   PROT 5.6* 08/09/2015   ALBUMIN 2.3* 08/09/2015   AST 30 08/09/2015   ALT 19 08/09/2015   ALKPHOS 138* 08/09/2015   BILITOT 0.3 08/09/2015   GFRNONAA >60 08/12/2015   GFRAA >60 08/12/2015    Lab Results  Component Value Date   WBC 6.2  09/04/2015   NEUTROABS 4.4 09/04/2015   HGB 12.0 09/04/2015   HCT 36.5 09/04/2015   MCV 82.3 09/04/2015   PLT 301 09/04/2015   Lab Results  Component Value Date   IRON 83 09/04/2015   TIBC 287 09/04/2015   IRONPCTSAT 29 09/04/2015    Lab Results  Component Value Date   FERRITIN 8* 09/04/2015     STUDIES: Dg Chest 2 View  08/09/2015  CLINICAL DATA:  Shortness of broad for 6 months, worsening shortness of breath last 2 days, productive cough for 2 days, history of asthma EXAM: CHEST  2 VIEW COMPARISON:  12/31/2014 FINDINGS: Cardiomegaly again noted. Mild perihilar bronchitic changes. Mild perihilar interstitial prominence suspicious for mild interstitial edema or pneumonitis. IMPRESSION: Mild perihilar bronchitic changes. Mild perihilar interstitial prominence suspicious for mild interstitial edema or pneumonitis. Cardiomegaly again noted. Electronically Signed   By: Orlean Bradford.D.  On: 08/09/2015 14:49    ASSESSMENT: Iron deficiency anemia.  PLAN:    1. Iron deficiency anemia: Likely secondary to heavy menses. [name] hemoglobin is now within normal limits, although her iron stores are slightly decreased. She does not require IV Feraheme at this time, but likely will need infusion at her next clinic visit. She last received IV Feraheme in March 2017.  Previously, patient had a mild allergic reaction to Feraheme therefore in addition to her treatment she will receive 20 mg of dexamethasone, 25 mg Benadryl, 50 mg ranitidine, and 650 mg of Tylenol.  Patient will then return to clinic in 3 months for repeat laboratory work and further evaluation. 2. Thrombocytosis: Resolved.  Likely secondary to iron deficiency anemia. Monitor. 3. Leukocytosis: Resolved.  4. Shortness of Breath: Continue oxygen as prescribed.  Patient expressed understanding and was in agreement with this plan. She also understands that She can call clinic at any time with any questions, concerns, or complaints.     Lloyd Huger, MD   09/04/2015 4:28 PM

## 2015-10-02 ENCOUNTER — Ambulatory Visit: Payer: Medicaid Other | Attending: Family | Admitting: Family

## 2015-10-02 ENCOUNTER — Encounter: Payer: Self-pay | Admitting: Family

## 2015-10-02 VITALS — BP 163/92 | HR 89 | Resp 18 | Ht 64.0 in | Wt 337.0 lb

## 2015-10-02 DIAGNOSIS — D649 Anemia, unspecified: Secondary | ICD-10-CM | POA: Insufficient documentation

## 2015-10-02 DIAGNOSIS — Z794 Long term (current) use of insulin: Secondary | ICD-10-CM | POA: Insufficient documentation

## 2015-10-02 DIAGNOSIS — I509 Heart failure, unspecified: Secondary | ICD-10-CM | POA: Insufficient documentation

## 2015-10-02 DIAGNOSIS — I5032 Chronic diastolic (congestive) heart failure: Secondary | ICD-10-CM

## 2015-10-02 DIAGNOSIS — F419 Anxiety disorder, unspecified: Secondary | ICD-10-CM | POA: Insufficient documentation

## 2015-10-02 DIAGNOSIS — Z79899 Other long term (current) drug therapy: Secondary | ICD-10-CM | POA: Diagnosis not present

## 2015-10-02 DIAGNOSIS — Z833 Family history of diabetes mellitus: Secondary | ICD-10-CM | POA: Insufficient documentation

## 2015-10-02 DIAGNOSIS — Z8489 Family history of other specified conditions: Secondary | ICD-10-CM | POA: Diagnosis not present

## 2015-10-02 DIAGNOSIS — J45909 Unspecified asthma, uncomplicated: Secondary | ICD-10-CM | POA: Diagnosis not present

## 2015-10-02 DIAGNOSIS — I110 Hypertensive heart disease with heart failure: Secondary | ICD-10-CM | POA: Insufficient documentation

## 2015-10-02 DIAGNOSIS — Z791 Long term (current) use of non-steroidal anti-inflammatories (NSAID): Secondary | ICD-10-CM | POA: Insufficient documentation

## 2015-10-02 DIAGNOSIS — E119 Type 2 diabetes mellitus without complications: Secondary | ICD-10-CM | POA: Insufficient documentation

## 2015-10-02 DIAGNOSIS — I10 Essential (primary) hypertension: Secondary | ICD-10-CM

## 2015-10-02 DIAGNOSIS — J4530 Mild persistent asthma, uncomplicated: Secondary | ICD-10-CM

## 2015-10-02 DIAGNOSIS — Z8249 Family history of ischemic heart disease and other diseases of the circulatory system: Secondary | ICD-10-CM | POA: Insufficient documentation

## 2015-10-02 DIAGNOSIS — E1165 Type 2 diabetes mellitus with hyperglycemia: Secondary | ICD-10-CM

## 2015-10-02 NOTE — Progress Notes (Signed)
Subjective:    Patient ID: [name], female    DOB: 06/06/1970, 45 y.o.   MRN: [id number]  Congestive Heart Failure  Presents for follow-up visit. Associated symptoms include fatigue and shortness of breath. Pertinent negatives include no abdominal pain, chest pain, edema, orthopnea or palpitations. The symptoms have been stable. Compliance problems include adherence to exercise.  Compliance with exercise is 0-25%.  Hypertension  This is a chronic problem. The current episode started more than 1 year ago. The problem has been waxing and waning since onset. Associated symptoms include shortness of breath. Pertinent negatives include no chest pain, neck pain, palpitations or peripheral edema. There are no associated agents to hypertension. Risk factors for coronary artery disease include obesity, sedentary lifestyle and diabetes mellitus. Past treatments include calcium channel blockers, angiotensin blockers, diuretics and lifestyle changes. The current treatment provides moderate improvement. Compliance problems include exercise.  Hypertensive end-organ damage includes heart failure. There is no history of kidney disease or CVA.   Past Medical History:  Diagnosis Date  . Asthma   . CHF (congestive heart failure) ([place])   . Diabetes mellitus without complication ([place])   . Hypertension   . Iron deficiency anemia   . Pericardial effusion    a. 11/2014 CT Chest: Mod effusion.  [name] Poorly controlled diabetes mellitus ([place])    a. 11/2014 A1c 13.2.    Past Surgical History:  Procedure Laterality Date  . CESAREAN SECTION      Family History  Problem Relation Age of Onset  . Hypertension    . Diabetes    . Diabetes Mother   . Hypertension Mother   . Hypertension Father   . Diabetes Father     Social History  Substance Use Topics  . Smoking status: Never Smoker  . Smokeless tobacco: Never Used  . Alcohol use 0.0 oz/week    Allergies  Allergen Reactions  . Feraheme  [Ferumoxytol] Itching  . Sulfa Antibiotics Itching   Prior to Admission medications   Medication Sig Start Date End Date Taking? Authorizing Provider  acetaminophen (TYLENOL) 500 MG tablet Take 1,000 mg by mouth every 6 (six) hours as needed for mild pain, fever or headache.   Yes Historical Provider, MD  albuterol (PROVENTIL HFA;VENTOLIN HFA) 108 (90 BASE) MCG/ACT inhaler Inhale 2 puffs into the lungs every 6 (six) hours as needed for wheezing or shortness of breath. 12/19/14  Yes Dustin Flock, MD  amLODipine (NORVASC) 10 MG tablet Take 1 tablet (10 mg total) by mouth daily. 12/19/14  Yes Dustin Flock, MD  aspirin EC 81 MG tablet Take 81 mg by mouth at bedtime.   Yes Historical Provider, MD  budesonide (PULMICORT) 0.25 MG/2ML nebulizer solution Take 2 mLs (0.25 mg total) by nebulization 2 (two) times daily. 12/19/14  Yes Dustin Flock, MD  citalopram (CELEXA) 40 MG tablet Take 40 mg by mouth daily.   Yes Historical Provider, MD  hydrALAZINE (APRESOLINE) 100 MG tablet Take 1 tablet (100 mg total) by mouth every 8 (eight) hours. 12/19/14  Yes Dustin Flock, MD  insulin aspart (NOVOLOG FLEXPEN) 100 UNIT/ML FlexPen Inject 0-12 Units into the skin 3 (three) times daily with meals as needed for high blood sugar. Pt uses as needed per sliding scale.   Yes Historical Provider, MD  Insulin Degludec (TRESIBA FLEXTOUCH) 100 UNIT/ML SOPN Inject 45 Units into the skin at bedtime. Patient taking differently: Inject 48 Units into the skin at bedtime.  08/13/15  Yes Demetrios Loll, MD  ipratropium-albuterol (DUONEB)  0.5-2.5 (3) MG/3ML SOLN Take 3 mLs by nebulization every 6 (six) hours as needed (for wheezing/shortness of breath).   Yes Historical Provider, MD  losartan (COZAAR) 50 MG tablet Take 1 tablet (50 mg total) by mouth daily. 01/03/15  Yes Dustin Flock, MD  metoprolol (LOPRESSOR) 50 MG tablet Take 50 mg by mouth 2 (two) times daily.   Yes Historical Provider, MD  potassium chloride (K-DUR,KLOR-CON) 10  MEQ tablet Take 1 tablet (10 mEq total) by mouth 2 (two) times daily. 12/19/14  Yes Dustin Flock, MD  simvastatin (ZOCOR) 40 MG tablet Take 40 mg by mouth at bedtime.   Yes Historical Provider, MD  torsemide (DEMADEX) 20 MG tablet Take 2 tablets (40 mg total) by mouth daily. Patient taking differently: Take 40 mg by mouth 2 (two) times daily.  08/13/15  Yes Demetrios Loll, MD  guaiFENesin-dextromethorphan (ROBITUSSIN DM) 100-10 MG/5ML syrup Take 5 mLs by mouth every 4 (four) hours as needed for cough. 08/13/15   Demetrios Loll, MD      Review of Systems  Constitutional: Positive for fatigue. Negative for appetite change.  HENT: Positive for congestion. Negative for rhinorrhea and sore throat.   Eyes: Positive for visual disturbance (blurry vision). Negative for pain.  Respiratory: Positive for cough, shortness of breath and wheezing. Negative for chest tightness.   Cardiovascular: Negative for chest pain, palpitations and leg swelling.  Gastrointestinal: Negative for abdominal distention and abdominal pain.  Endocrine: Negative.   Genitourinary: Negative.   Musculoskeletal: Negative for back pain and neck pain.  Skin: Negative.   Allergic/Immunologic: Negative.   Neurological: Negative for dizziness and light-headedness.  Hematological: Negative for adenopathy. Does not bruise/bleed easily.  Psychiatric/Behavioral: Negative for dysphoric mood and sleep disturbance (sleeping on 1 pillow and oxygen at 3L). The patient is nervous/anxious.        Objective:   Physical Exam  Constitutional: She is oriented to person, place, and time. She appears well-developed and well-nourished.  HENT:  Head: Normocephalic and atraumatic.  Eyes: Conjunctivae are normal. Pupils are equal, round, and reactive to light.  Neck: Normal range of motion. Neck supple.  Cardiovascular: Normal rate and regular rhythm.   Pulmonary/Chest: Effort normal. She has rhonchi in the right upper field, the left upper field and the  left lower field. She has no rales.  Abdominal: Soft. She exhibits no distension. There is no tenderness.  Musculoskeletal: She exhibits no edema or tenderness.  Neurological: She is alert and oriented to person, place, and time.  Skin: Skin is warm and dry.  Psychiatric: She has a normal mood and affect. Her behavior is normal. Thought content normal.  Nursing note and vitals reviewed.   BP (!) 163/92 (BP Location: Left Arm, Patient Position: Sitting)   Pulse 89   Resp 18   Ht 5\' 4"  (1.626 m)   Wt (!) 337 lb (152.9 kg)   SpO2 94% Comment: on 3L  BMI 57.85 kg/m        Assessment & Plan:  1: Chronic heart failure with preserved ejection fraction- Patient presents with fatigue and shortness of breath with mild exertion (Class III) which does improve quickly upon rest. She continues to weigh herself and reports a gradual weight gain. She does notice that she gains weight during her menstrual cycle. By our scale, she's gained 12.6 pounds since she was last here on 08/30/15. Reminded to call for an overnight weight gain of >2 pounds or a weekly weight gain of >5 pounds. She says that she's  not getting any exercise and is getting ready to start physical therapy. She is also hoping to get back to [facility] as well. She is not adding any salt to her food and tries to follow a low sodium diet.  2: HTN- Blood pressure mildly elevated today but she says that she hasn't taken her medications yet today. Says that she will take them upon going home today. Continue to monitor blood pressure as her blood pressure could be getting higher due to weight gain. 3: Diabetes- Patient says that her glucose this morning was 198. She is awaiting on a referral to endocrinology for further assistance.  4: Asthma- She continues to wear her oxygen at 3L around the clock. She has noticed an increase in her wheezing and she thinks it's related to the rain and humidity. Is using her inhalers. 5: Anxiety- Patient says that  she has chronic anxiety and follows closely with her PCP regarding this. She says that she doesn't drive much anymore and really doesn't like riding in the car with certain people. Does take citalopram which does help some.  Patient did not bring her medications nor a list. Each medication was verbally reviewed with the patient and she was encouraged to bring the bottles to every visit to confirm accuracy of list.  Return in 3 months or sooner for any questions/problems before then.

## 2015-10-02 NOTE — Patient Instructions (Signed)
Continue weighing daily and call for an overnight weight gain of > 2 pounds or a weekly weight gain of >5 pounds. 

## 2015-10-11 LAB — CULTURE, BLOOD (ROUTINE X 2)
CULTURE: NO GROWTH
CULTURE: NO GROWTH

## 2015-10-19 ENCOUNTER — Ambulatory Visit
Admission: RE | Admit: 2015-10-19 | Discharge: 2015-10-19 | Disposition: A | Discharge status: Home / Self Care | Payer: Medicaid Other | Source: Ambulatory Visit | Attending: Nurse Practitioner | Admitting: Nurse Practitioner

## 2015-10-19 DIAGNOSIS — R928 Other abnormal and inconclusive findings on diagnostic imaging of breast: Secondary | ICD-10-CM | POA: Insufficient documentation

## 2015-10-19 DIAGNOSIS — Z1231 Encounter for screening mammogram for malignant neoplasm of breast: Secondary | ICD-10-CM | POA: Insufficient documentation

## 2015-10-20 ENCOUNTER — Other Ambulatory Visit: Payer: Self-pay | Admitting: Nurse Practitioner

## 2015-10-20 DIAGNOSIS — N6310 Unspecified lump in the right breast, unspecified quadrant: Secondary | ICD-10-CM

## 2015-10-26 ENCOUNTER — Ambulatory Visit
Admission: RE | Admit: 2015-10-26 | Discharge: 2015-10-26 | Disposition: A | Discharge status: Home / Self Care | Payer: Medicaid Other | Source: Ambulatory Visit | Attending: Nurse Practitioner | Admitting: Nurse Practitioner

## 2015-10-26 DIAGNOSIS — N6310 Unspecified lump in the right breast, unspecified quadrant: Secondary | ICD-10-CM

## 2015-10-26 DIAGNOSIS — N63 Unspecified lump in breast: Secondary | ICD-10-CM | POA: Diagnosis present

## 2015-10-27 ENCOUNTER — Other Ambulatory Visit: Payer: Self-pay | Admitting: Nurse Practitioner

## 2015-10-27 DIAGNOSIS — N63 Unspecified lump in unspecified breast: Secondary | ICD-10-CM

## 2015-11-17 ENCOUNTER — Encounter: Payer: Self-pay | Admitting: Emergency Medicine

## 2015-11-17 ENCOUNTER — Emergency Department
Admission: EM | Admit: 2015-11-17 | Discharge: 2015-11-17 | Disposition: A | Discharge status: Home / Self Care | Payer: Medicaid Other | Attending: Emergency Medicine | Admitting: Emergency Medicine

## 2015-11-17 DIAGNOSIS — I509 Heart failure, unspecified: Secondary | ICD-10-CM | POA: Insufficient documentation

## 2015-11-17 DIAGNOSIS — Z791 Long term (current) use of non-steroidal anti-inflammatories (NSAID): Secondary | ICD-10-CM | POA: Diagnosis not present

## 2015-11-17 DIAGNOSIS — R1900 Intra-abdominal and pelvic swelling, mass and lump, unspecified site: Secondary | ICD-10-CM | POA: Insufficient documentation

## 2015-11-17 DIAGNOSIS — Z794 Long term (current) use of insulin: Secondary | ICD-10-CM | POA: Insufficient documentation

## 2015-11-17 DIAGNOSIS — R05 Cough: Secondary | ICD-10-CM

## 2015-11-17 DIAGNOSIS — Z79899 Other long term (current) drug therapy: Secondary | ICD-10-CM | POA: Insufficient documentation

## 2015-11-17 DIAGNOSIS — R600 Localized edema: Secondary | ICD-10-CM

## 2015-11-17 DIAGNOSIS — J90 Pleural effusion, not elsewhere classified: Secondary | ICD-10-CM | POA: Diagnosis not present

## 2015-11-17 DIAGNOSIS — E119 Type 2 diabetes mellitus without complications: Secondary | ICD-10-CM | POA: Insufficient documentation

## 2015-11-17 DIAGNOSIS — J45909 Unspecified asthma, uncomplicated: Secondary | ICD-10-CM | POA: Diagnosis not present

## 2015-11-17 DIAGNOSIS — I110 Hypertensive heart disease with heart failure: Secondary | ICD-10-CM | POA: Insufficient documentation

## 2015-11-17 DIAGNOSIS — Z7982 Long term (current) use of aspirin: Secondary | ICD-10-CM | POA: Diagnosis not present

## 2015-11-17 DIAGNOSIS — R059 Cough, unspecified: Secondary | ICD-10-CM

## 2015-11-17 NOTE — ED Provider Notes (Signed)
[facility] Emergency Department Provider Note   ____________________________________________   First MD Initiated Contact with Patient 11/17/15 1705     (approximate)  I have reviewed the triage vital signs and the nursing notes.   HISTORY  Chief Complaint Abdominal Pain   HPI [name] is a 45 y.o. female with a history of CHF as well as a pericardial effusion and morbid obesity who is presenting to the emergency department today after being told to come in to the emergency department by her outpatient health care provider [facility]. She had a CAT scan done as an outpatient secondary to pain in her lower abdomen that is been on and off over the past 4 months. She says that she also has had skin hardening of her lower abdomen or the past 4 months. She says the pain is needed at this time. She is also complaining of a cough which she says is been ongoing for the past 1-2 months but says that she had a course of antibiotics which consisted of azithromycin and an additional antibiotic which she says was likely Keflex. He says the cough did not improve. Denies any fever at this time. Says that she takes 20 mg of Lasix twice a day but was told by her cardiologist that she may increase it to 40 mg twice a day as needed. She'll takes potassium supplementation at home. Denies any nausea vomiting or diarrhea.   Past Medical History:  Diagnosis Date  . Asthma   . CHF (congestive heart failure) ([place])   . Diabetes mellitus without complication ([place])   . Hypertension   . Iron deficiency anemia   . Pericardial effusion    a. 11/2014 CT Chest: Mod effusion.  [name] Poorly controlled diabetes mellitus ([place])    a. 11/2014 A1c 13.2.    Patient Active Problem List   Diagnosis Date Noted  . Anxiety 10/02/2015  . Asthma 08/30/2015  . Poorly controlled type 2 diabetes mellitus ([place]) 03/13/2015  . Iron deficiency anemia 02/17/2015  . Morbid obesity due to excess  calories ([place])   . Shortness of breath   . Essential hypertension   . CHF (congestive heart failure) ([place]) 01/01/2015  . Hypertensive heart disease with congestive heart failure ([place]) 12/18/2014  . Diabetes ([place]) 12/15/2014    Past Surgical History:  Procedure Laterality Date  . CESAREAN SECTION      Prior to Admission medications   Medication Sig Start Date End Date Taking? Authorizing Provider  acetaminophen (TYLENOL) 500 MG tablet Take 1,000 mg by mouth every 6 (six) hours as needed for mild pain, fever or headache.    Historical Provider, MD  albuterol (PROVENTIL HFA;VENTOLIN HFA) 108 (90 BASE) MCG/ACT inhaler Inhale 2 puffs into the lungs every 6 (six) hours as needed for wheezing or shortness of breath. 12/19/14   Dustin Flock, MD  amLODipine (NORVASC) 10 MG tablet Take 1 tablet (10 mg total) by mouth daily. 12/19/14   Dustin Flock, MD  aspirin EC 81 MG tablet Take 81 mg by mouth at bedtime.    Historical Provider, MD  budesonide (PULMICORT) 0.25 MG/2ML nebulizer solution Take 2 mLs (0.25 mg total) by nebulization 2 (two) times daily. 12/19/14   Dustin Flock, MD  citalopram (CELEXA) 40 MG tablet Take 40 mg by mouth daily.    Historical Provider, MD  guaiFENesin-dextromethorphan (ROBITUSSIN DM) 100-10 MG/5ML syrup Take 5 mLs by mouth every 4 (four) hours as needed for cough. 08/13/15   Demetrios Loll,  MD  hydrALAZINE (APRESOLINE) 100 MG tablet Take 1 tablet (100 mg total) by mouth every 8 (eight) hours. 12/19/14   Dustin Flock, MD  insulin aspart (NOVOLOG FLEXPEN) 100 UNIT/ML FlexPen Inject 0-12 Units into the skin 3 (three) times daily with meals as needed for high blood sugar. Pt uses as needed per sliding scale.    Historical Provider, MD  Insulin Degludec (TRESIBA FLEXTOUCH) 100 UNIT/ML SOPN Inject 45 Units into the skin at bedtime. Patient taking differently: Inject 48 Units into the skin at bedtime.  08/13/15   Demetrios Loll, MD  ipratropium-albuterol (DUONEB) 0.5-2.5 (3) MG/3ML  SOLN Take 3 mLs by nebulization every 6 (six) hours as needed (for wheezing/shortness of breath).    Historical Provider, MD  losartan (COZAAR) 50 MG tablet Take 1 tablet (50 mg total) by mouth daily. 01/03/15   Dustin Flock, MD  metoprolol (LOPRESSOR) 50 MG tablet Take 50 mg by mouth 2 (two) times daily.    Historical Provider, MD  potassium chloride (K-DUR,KLOR-CON) 10 MEQ tablet Take 1 tablet (10 mEq total) by mouth 2 (two) times daily. 12/19/14   Dustin Flock, MD  simvastatin (ZOCOR) 40 MG tablet Take 40 mg by mouth at bedtime.    Historical Provider, MD  torsemide (DEMADEX) 20 MG tablet Take 2 tablets (40 mg total) by mouth daily. Patient taking differently: Take 40 mg by mouth 2 (two) times daily.  08/13/15   Demetrios Loll, MD    Allergies Feraheme [ferumoxytol] and Sulfa antibiotics  Family History  Problem Relation Age of Onset  . Diabetes Mother   . Hypertension Mother   . Hypertension Father   . Diabetes Father   . Hypertension    . Diabetes    . Breast cancer Maternal Aunt 43  . Breast cancer Maternal Aunt 50    Social History Social History  Substance Use Topics  . Smoking status: Never Smoker  . Smokeless tobacco: Never Used  . Alcohol use 0.0 oz/week    Review of Systems Constitutional: No fever/chills Eyes: No visual changes. ENT: No sore throat. Cardiovascular: Denies chest pain. Respiratory: Denies shortness of breath. Gastrointestinal: No nausea, no vomiting.  No diarrhea.  No constipation. Genitourinary: Negative for dysuria. Musculoskeletal: Negative for back pain. Skin: Negative for rash. Neurological: Negative for headaches, focal weakness or numbness.  10-point ROS otherwise negative.  ____________________________________________   PHYSICAL EXAM:  VITAL SIGNS: ED Triage Vitals  Enc Vitals Group     BP 11/17/15 1659 (!) 192/102     Pulse Rate 11/17/15 1659 88     Resp 11/17/15 1659 20     Temp 11/17/15 1659 98 F (36.7 C)     Temp Source  11/17/15 1659 Oral     SpO2 11/17/15 1659 91 %     Weight 11/17/15 1700 (!) 350 lb (158.8 kg)     Height 11/17/15 1700 5\' 4"  (1.626 m)     Head Circumference --      Peak Flow --      Pain Score 11/17/15 1701 7     Pain Loc --      Pain Edu? --      Excl. in [place]? --     Constitutional: Alert and oriented. Well appearing and in no acute distress. Eyes: Conjunctivae are normal. PERRL. EOMI. Head: Atraumatic. Nose: No congestion/rhinnorhea. Mouth/Throat: Mucous membranes are moist.  Oropharynx non-erythematous. Neck: No stridor.   Cardiovascular: Normal rate, regular rhythm. Grossly normal heart sounds.  Good peripheral circulation. Respiratory: Normal respiratory  effort.  No retractions. Lungs CTAB. Gastrointestinal: Soft with mild tenderness over the mid abdomen where there is evidence of chronic skin change with hyperpigmented skin and mild to moderate edema. There is no fluctuance. No warmth. No pus. No distention.  Musculoskeletal: Mild to moderate bilateral lower extremity edema which the patient says is chronic. Neurologic:  Normal speech and language. No gross focal neurologic deficits are appreciated. No gait instability. Skin:  Skin is warm, dry and intact. No rash noted. Psychiatric: Mood and affect are normal. Speech and behavior are normal.  ____________________________________________   LABS (all labs ordered are listed, but only abnormal results are displayed)  Labs Reviewed - No data to display ____________________________________________  EKG   ____________________________________________  RADIOLOGY   ____________________________________________   PROCEDURES  Procedure(s) performed:   Procedures  Critical Care performed:   ____________________________________________   INITIAL IMPRESSION / ASSESSMENT AND PLAN / ED COURSE  Pertinent labs & imaging results that were available during my care of the patient were reviewed by me and considered in my  medical decision making (see chart for details).  Patient with findings consistent with abdominal wall edema. I have a copy of the CAT scan report that she had as an outpatient today and the report reads that the patient has extensive increased density in the subcutaneous tissue over the lower anterior abdominal wall with skin thickening consistent with subcutaneous edema. There are no masses or adenopathy or inflammatory processes otherwise negative in her pelvis but there is a small right pleural effusion visualized. Because the patient recently had a course of 2 antibiotics it is more likely that her cough is related to pleural effusion. I recommended her that she take 40 mg twice a day for Lasix and continue taking her potassium supplementation. She says that she'll be able to follow-up with her cardiologist next 5 days at [facility]. She is understanding of the plan and willing to comply. I also discussed with her further lab draws but she said that she had a recent lab panel and said that her kidney function was normal as of about 2 weeks ago. The patient seems very aware and insightful as far as her medical care goes and although I'm unable to see her lab work I trust that it will be safe to increase her labs. Her symptoms appear mostly chronic and I feel that she would benefit from a short course of increasing her torsemide. She is understanding of the plan and willing to comply. Less likely to be cellulitic.  Clinical Course     ____________________________________________   FINAL CLINICAL IMPRESSION(S) / ED DIAGNOSES  Abdominal wall edema. Pleural effusion. Cough.    NEW MEDICATIONS STARTED DURING THIS VISIT:  New Prescriptions   No medications on file     Note:  This document was prepared using Dragon voice recognition software and may include unintentional dictation errors.    Orbie Pyo, MD 11/17/15 [phone]

## 2015-11-17 NOTE — ED Triage Notes (Signed)
Reports pain in abd and her MD sent her for a CT, told to come here bc"she had fluid"

## 2015-12-03 NOTE — Progress Notes (Signed)
[place]  [phone] [phone]) [phone]  ID: [name] OB: 09/06/1970  MR#: [id number]  TFT#:[id number]  Patient Care Team: Kasandra Knudsen, NP as PCP - General (Nurse Practitioner) Minna Merritts, MD as Consulting Physician (Cardiology) Alisa Graff, FNP as Nurse Practitioner (Family Medicine)  CHIEF COMPLAINT: Iron deficiency anemia.  INTERVAL HISTORY: Patient returns to clinic today for repeat laboratory work and further evaluation. She has chronic weakness and fatigue, that is essentially unchanged. She also continues to have chronic shortness of breath and requires oxygen 24 hours per day.  She denies any fevers. She has no neurologic complaints. She has no chest pain, cough, or hemoptysis. She denies any nausea, vomiting, consultation, or diarrhea. She has no melena or hematochezia. Patient otherwise feels well and offers no further specific complaints.  REVIEW OF SYSTEMS:   Review of Systems  Constitutional: Positive for malaise/fatigue. Negative for fever and weight loss.  Respiratory: Positive for shortness of breath. Negative for hemoptysis and sputum production.   Cardiovascular: Negative.  Negative for chest pain.  Gastrointestinal: Negative.  Negative for abdominal pain, blood in stool and melena.  Genitourinary: Negative.   Musculoskeletal: Negative.   Neurological: Positive for weakness.  Psychiatric/Behavioral: Negative.  The patient is not nervous/anxious.     As per HPI. Otherwise, a complete review of systems is negative.  PAST MEDICAL HISTORY: Past Medical History:  Diagnosis Date  . Asthma   . CHF (congestive heart failure) ([place])   . Diabetes mellitus without complication ([place])   . Hypertension   . Iron deficiency anemia   . Pericardial effusion    a. 11/2014 CT Chest: Mod effusion.  [name] Poorly controlled diabetes mellitus ([place])    a. 11/2014 A1c 13.2.    PAST SURGICAL HISTORY: Past Surgical History:  Procedure  Laterality Date  . CESAREAN SECTION      FAMILY HISTORY Family History  Problem Relation Age of Onset  . Diabetes Mother   . Hypertension Mother   . Hypertension Father   . Diabetes Father   . Hypertension    . Diabetes    . Breast cancer Maternal Aunt 30  . Breast cancer Maternal Aunt 75       ADVANCED DIRECTIVES:    HEALTH MAINTENANCE: Social History  Substance Use Topics  . Smoking status: Never Smoker  . Smokeless tobacco: Never Used  . Alcohol use 0.0 oz/week     Colonoscopy:  PAP:  Bone density:  Lipid panel:  Allergies  Allergen Reactions  . Feraheme [Ferumoxytol] Itching  . Sulfa Antibiotics Itching    Current Outpatient Prescriptions  Medication Sig Dispense Refill  . acetaminophen (TYLENOL) 500 MG tablet Take 1,000 mg by mouth every 6 (six) hours as needed for mild pain, fever or headache.    . albuterol (PROVENTIL HFA;VENTOLIN HFA) 108 (90 BASE) MCG/ACT inhaler Inhale 2 puffs into the lungs every 6 (six) hours as needed for wheezing or shortness of breath. 1 Inhaler 0  . amLODipine (NORVASC) 10 MG tablet Take 1 tablet (10 mg total) by mouth daily. 30 tablet 0  . aspirin EC 81 MG tablet Take 81 mg by mouth at bedtime.    . budesonide (PULMICORT) 0.25 MG/2ML nebulizer solution Take 2 mLs (0.25 mg total) by nebulization 2 (two) times daily. 60 mL 12  . citalopram (CELEXA) 40 MG tablet Take 40 mg by mouth daily.    [name] guaiFENesin-dextromethorphan (ROBITUSSIN DM) 100-10 MG/5ML syrup Take 5 mLs by mouth every 4 (four) hours  as needed for cough. 118 mL 0  . hydrALAZINE (APRESOLINE) 100 MG tablet Take 1 tablet (100 mg total) by mouth every 8 (eight) hours. 60 tablet 0  . insulin aspart (NOVOLOG FLEXPEN) 100 UNIT/ML FlexPen Inject 0-12 Units into the skin 3 (three) times daily with meals as needed for high blood sugar. Pt uses as needed per sliding scale.    . Insulin Degludec (TRESIBA FLEXTOUCH) 100 UNIT/ML SOPN Inject 45 Units into the skin at bedtime. 1 pen 2    . ipratropium-albuterol (DUONEB) 0.5-2.5 (3) MG/3ML SOLN Take 3 mLs by nebulization every 6 (six) hours as needed (for wheezing/shortness of breath).    . losartan (COZAAR) 50 MG tablet Take 1 tablet (50 mg total) by mouth daily. 30 tablet 0  . metoprolol (LOPRESSOR) 50 MG tablet Take 50 mg by mouth 2 (two) times daily.    . potassium chloride (K-DUR,KLOR-CON) 10 MEQ tablet Take 1 tablet (10 mEq total) by mouth 2 (two) times daily. 30 tablet 0  . simvastatin (ZOCOR) 40 MG tablet Take 40 mg by mouth at bedtime.    . torsemide (DEMADEX) 20 MG tablet Take 2 tablets (40 mg total) by mouth daily. (Patient taking differently: Take 40 mg by mouth 2 (two) times daily. ) 30 tablet 0   No current facility-administered medications for this visit.     OBJECTIVE: Vitals:   12/04/15 1445  BP: (!) 171/97  Pulse: 98  Resp: 18  Temp: (!) 96.5 F (35.8 C)     Body mass index is 58.31 kg/m.    ECOG FS:1 - Symptomatic but completely ambulatory  General: Well-developed, well-nourished, no acute distress. Eyes: Pink conjunctiva, anicteric sclera. Lungs: Clear to auscultation bilaterally. Heart: Regular rate and rhythm. No rubs, murmurs, or gallops. Abdomen: Soft, nontender, nondistended. No organomegaly noted, normoactive bowel sounds. Musculoskeletal: No edema, cyanosis, or clubbing. Neuro: Alert, answering all questions appropriately. Cranial nerves grossly intact. Skin: No rashes or petechiae noted. Psych: Normal affect.   LAB RESULTS:  Lab Results  Component Value Date   NA 136 08/12/2015   K 3.9 08/12/2015   CL 99 (L) 08/12/2015   CO2 30 08/12/2015   GLUCOSE 122 (H) 08/12/2015   BUN 22 (H) 08/12/2015   CREATININE 1.09 (H) 08/12/2015   CALCIUM 8.2 (L) 08/12/2015   PROT 5.6 (L) 08/09/2015   ALBUMIN 2.3 (L) 08/09/2015   AST 30 08/09/2015   ALT 19 08/09/2015   ALKPHOS 138 (H) 08/09/2015   BILITOT 0.3 08/09/2015   GFRNONAA >60 08/12/2015   GFRAA >60 08/12/2015    Lab Results   Component Value Date   WBC 7.4 12/04/2015   NEUTROABS 5.9 12/04/2015   HGB 10.5 (L) 12/04/2015   HCT 32.0 (L) 12/04/2015   MCV 80.9 12/04/2015   PLT 319 12/04/2015   Lab Results  Component Value Date   IRON 21 (L) 12/04/2015   TIBC 294 12/04/2015   IRONPCTSAT 7 (L) 12/04/2015    Lab Results  Component Value Date   FERRITIN 11 12/04/2015     STUDIES: No results found.  ASSESSMENT: Iron deficiency anemia.  PLAN:    1. Iron deficiency anemia: Likely secondary to heavy menses. [name] hemoglobin And iron stores have trended down and she is symptomatic. Proceed with 510 mg IV Feraheme today. Return to clinic in one week for a second infusion. Previously, patient had a mild allergic reaction to Feraheme therefore in addition to her treatment she will receive 20 mg of dexamethasone, 25 mg Benadryl, 50  mg ranitidine, and 650 mg of Tylenol.  Patient will then return to clinic in 3 months for repeat laboratory work and further evaluation. 2. Shortness of Breath: Continue oxygen as prescribed. 3. Hypertension: [name] blood pressure is elevated today, continue current medications. Treatment per PCP.  Patient expressed understanding and was in agreement with this plan. She also understands that She can call clinic at any time with any questions, concerns, or complaints.    Lloyd Huger, MD   12/07/2015 10:26 AM

## 2015-12-04 ENCOUNTER — Other Ambulatory Visit: Payer: Self-pay

## 2015-12-04 ENCOUNTER — Inpatient Hospital Stay: Payer: Medicaid Other | Attending: Oncology

## 2015-12-04 ENCOUNTER — Inpatient Hospital Stay (HOSPITAL_BASED_OUTPATIENT_CLINIC_OR_DEPARTMENT_OTHER): Payer: Medicaid Other | Admitting: Oncology

## 2015-12-04 ENCOUNTER — Inpatient Hospital Stay: Payer: Medicaid Other

## 2015-12-04 VITALS — BP 171/97 | HR 98 | Temp 96.5°F | Resp 18 | Wt 339.7 lb

## 2015-12-04 VITALS — BP 173/108 | HR 90

## 2015-12-04 DIAGNOSIS — D509 Iron deficiency anemia, unspecified: Secondary | ICD-10-CM | POA: Diagnosis not present

## 2015-12-04 DIAGNOSIS — Z794 Long term (current) use of insulin: Secondary | ICD-10-CM | POA: Diagnosis not present

## 2015-12-04 DIAGNOSIS — Z79899 Other long term (current) drug therapy: Secondary | ICD-10-CM

## 2015-12-04 DIAGNOSIS — I509 Heart failure, unspecified: Secondary | ICD-10-CM

## 2015-12-04 DIAGNOSIS — E119 Type 2 diabetes mellitus without complications: Secondary | ICD-10-CM

## 2015-12-04 DIAGNOSIS — Z9981 Dependence on supplemental oxygen: Secondary | ICD-10-CM | POA: Insufficient documentation

## 2015-12-04 DIAGNOSIS — R5383 Other fatigue: Secondary | ICD-10-CM | POA: Diagnosis not present

## 2015-12-04 DIAGNOSIS — I10 Essential (primary) hypertension: Secondary | ICD-10-CM | POA: Diagnosis not present

## 2015-12-04 DIAGNOSIS — J45909 Unspecified asthma, uncomplicated: Secondary | ICD-10-CM

## 2015-12-04 DIAGNOSIS — R531 Weakness: Secondary | ICD-10-CM | POA: Diagnosis not present

## 2015-12-04 DIAGNOSIS — Z803 Family history of malignant neoplasm of breast: Secondary | ICD-10-CM

## 2015-12-04 DIAGNOSIS — R0602 Shortness of breath: Secondary | ICD-10-CM | POA: Insufficient documentation

## 2015-12-04 DIAGNOSIS — Z7982 Long term (current) use of aspirin: Secondary | ICD-10-CM | POA: Insufficient documentation

## 2015-12-04 DIAGNOSIS — I313 Pericardial effusion (noninflammatory): Secondary | ICD-10-CM | POA: Insufficient documentation

## 2015-12-04 DIAGNOSIS — D500 Iron deficiency anemia secondary to blood loss (chronic): Secondary | ICD-10-CM

## 2015-12-04 LAB — IRON AND TIBC
Iron: 21 ug/dL — ABNORMAL LOW (ref 28–170)
Saturation Ratios: 7 % — ABNORMAL LOW (ref 10.4–31.8)
TIBC: 294 ug/dL (ref 250–450)
UIBC: 273 ug/dL

## 2015-12-04 LAB — CBC WITH DIFFERENTIAL/PLATELET
Basophils Absolute: 0.1 10*3/uL (ref 0–0.1)
Basophils Relative: 1 %
EOS ABS: 0.1 10*3/uL (ref 0–0.7)
Eosinophils Relative: 2 %
HCT: 32 % — ABNORMAL LOW (ref 35.0–47.0)
HEMOGLOBIN: 10.5 g/dL — AB (ref 12.0–16.0)
LYMPHS ABS: 0.9 10*3/uL — AB (ref 1.0–3.6)
LYMPHS PCT: 12 %
MCH: 26.5 pg (ref 26.0–34.0)
MCHC: 32.7 g/dL (ref 32.0–36.0)
MCV: 80.9 fL (ref 80.0–100.0)
MONOS PCT: 6 %
Monocytes Absolute: 0.4 10*3/uL (ref 0.2–0.9)
NEUTROS ABS: 5.9 10*3/uL (ref 1.4–6.5)
NEUTROS PCT: 79 %
Platelets: 319 10*3/uL (ref 150–440)
RBC: 3.95 MIL/uL (ref 3.80–5.20)
RDW: 13.9 % (ref 11.5–14.5)
WBC: 7.4 10*3/uL (ref 3.6–11.0)

## 2015-12-04 LAB — FERRITIN: Ferritin: 11 ng/mL (ref 11–307)

## 2015-12-04 MED ORDER — ACETAMINOPHEN 325 MG PO TABS
650.0000 mg | ORAL_TABLET | Freq: Four times a day (QID) | ORAL | Status: DC | PRN
  Administered 2015-12-04: 650 mg via ORAL
  Filled 2015-12-04: qty 2

## 2015-12-04 MED ORDER — FERUMOXYTOL INJECTION 510 MG/17 ML
510.0000 mg | Freq: Once | INTRAVENOUS | Status: AC
  Administered 2015-12-04: 510 mg via INTRAVENOUS
  Filled 2015-12-04: qty 17

## 2015-12-04 MED ORDER — SODIUM CHLORIDE 0.9 % IV SOLN
50.0000 mg | Freq: Once | INTRAVENOUS | Status: DC
  Filled 2015-12-04: qty 2

## 2015-12-04 MED ORDER — FAMOTIDINE IN NACL 20-0.9 MG/50ML-% IV SOLN
20.0000 mg | Freq: Two times a day (BID) | INTRAVENOUS | Status: DC
  Administered 2015-12-04: 20 mg via INTRAVENOUS
  Filled 2015-12-04: qty 50

## 2015-12-04 MED ORDER — SODIUM CHLORIDE 0.9 % IV SOLN
Freq: Once | INTRAVENOUS | Status: AC
  Administered 2015-12-04: 15:00:00 via INTRAVENOUS
  Filled 2015-12-04: qty 1000

## 2015-12-04 MED ORDER — SODIUM CHLORIDE 0.9 % IV SOLN
20.0000 mg | Freq: Once | INTRAVENOUS | Status: AC
  Administered 2015-12-04: 20 mg via INTRAVENOUS
  Filled 2015-12-04: qty 2

## 2015-12-04 MED ORDER — DIPHENHYDRAMINE HCL 50 MG/ML IJ SOLN
25.0000 mg | Freq: Once | INTRAMUSCULAR | Status: AC
  Administered 2015-12-04: 25 mg via INTRAVENOUS
  Filled 2015-12-04: qty 1

## 2015-12-04 NOTE — Progress Notes (Signed)
States is feeling tired, fatigued today.

## 2015-12-11 ENCOUNTER — Inpatient Hospital Stay: Payer: Medicaid Other

## 2015-12-11 VITALS — BP 146/89 | HR 66 | Temp 97.1°F | Resp 20

## 2015-12-11 DIAGNOSIS — D509 Iron deficiency anemia, unspecified: Secondary | ICD-10-CM | POA: Diagnosis not present

## 2015-12-11 DIAGNOSIS — D500 Iron deficiency anemia secondary to blood loss (chronic): Secondary | ICD-10-CM

## 2015-12-11 MED ORDER — SODIUM CHLORIDE 0.9 % IV SOLN
510.0000 mg | Freq: Once | INTRAVENOUS | Status: AC
  Administered 2015-12-11: 510 mg via INTRAVENOUS
  Filled 2015-12-11: qty 17

## 2015-12-11 MED ORDER — ACETAMINOPHEN 325 MG PO TABS
650.0000 mg | ORAL_TABLET | Freq: Four times a day (QID) | ORAL | Status: DC | PRN
  Administered 2015-12-11: 650 mg via ORAL
  Filled 2015-12-11: qty 2

## 2015-12-11 MED ORDER — DIPHENHYDRAMINE HCL 50 MG/ML IJ SOLN
25.0000 mg | Freq: Once | INTRAMUSCULAR | Status: AC
  Administered 2015-12-11: 25 mg via INTRAVENOUS
  Filled 2015-12-11: qty 1

## 2015-12-11 MED ORDER — SODIUM CHLORIDE 0.9 % IV SOLN
Freq: Once | INTRAVENOUS | Status: AC
  Administered 2015-12-11: 15:00:00 via INTRAVENOUS
  Filled 2015-12-11: qty 1000

## 2015-12-11 MED ORDER — DEXAMETHASONE SODIUM PHOSPHATE 100 MG/10ML IJ SOLN
20.0000 mg | Freq: Once | INTRAMUSCULAR | Status: AC
  Administered 2015-12-11: 20 mg via INTRAVENOUS
  Filled 2015-12-11: qty 2

## 2015-12-11 MED ORDER — SODIUM CHLORIDE 0.9 % IV SOLN: 50.0000 mg | Freq: Once | INTRAVENOUS | Status: DC

## 2015-12-11 MED ORDER — FAMOTIDINE IN NACL 20-0.9 MG/50ML-% IV SOLN
20.0000 mg | Freq: Two times a day (BID) | INTRAVENOUS | Status: DC
  Administered 2015-12-11: 20 mg via INTRAVENOUS
  Filled 2015-12-11: qty 50

## 2015-12-25 ENCOUNTER — Ambulatory Visit (INDEPENDENT_AMBULATORY_CARE_PROVIDER_SITE_OTHER): Payer: Medicaid Other | Admitting: Cardiovascular Disease

## 2015-12-25 ENCOUNTER — Encounter: Payer: Self-pay | Admitting: Cardiovascular Disease

## 2015-12-25 VITALS — BP 146/90 | HR 87 | Ht 64.5 in | Wt 351.8 lb

## 2015-12-25 DIAGNOSIS — Z9981 Dependence on supplemental oxygen: Secondary | ICD-10-CM | POA: Diagnosis not present

## 2015-12-25 DIAGNOSIS — R0602 Shortness of breath: Secondary | ICD-10-CM

## 2015-12-25 DIAGNOSIS — I5032 Chronic diastolic (congestive) heart failure: Secondary | ICD-10-CM | POA: Diagnosis not present

## 2015-12-25 DIAGNOSIS — J45909 Unspecified asthma, uncomplicated: Secondary | ICD-10-CM

## 2015-12-25 DIAGNOSIS — E1165 Type 2 diabetes mellitus with hyperglycemia: Secondary | ICD-10-CM

## 2015-12-25 DIAGNOSIS — I10 Essential (primary) hypertension: Secondary | ICD-10-CM | POA: Diagnosis not present

## 2015-12-25 DIAGNOSIS — D500 Iron deficiency anemia secondary to blood loss (chronic): Secondary | ICD-10-CM

## 2015-12-25 NOTE — Progress Notes (Signed)
Cardiology Office Note  Date:  12/25/2015   ID:  [name], DOB Nov 05, 1970, MRN [id number]  PCP:  Kasandra Knudsen, NP   Chief Complaint  Patient presents with  . CDCHF  . Hypertension    HPI:  45 yo morbidly obese woman with uncontrolled diabetes  with history of chronic diastolic CHF, hospitalization October 2016, November 2016 who presents for  follow-up of her diastolic CHF. Prior history of anemia, asthma  In follow-up today, she reports having a chronic cough  on oxygen, 2 L nasal cannula  recently hospitalized, d/c from [facility] 07/2015:  diagnosis of Bronchitis, hypoxia/PNA, less CHF She had dramatic climb in her renal function with Lasix that improved back down to her baseline by holding the diuretic    she has been seen by hematology, Iron low, getting infusions cancer clinic Loosing blood with cycles  She is taking torsemide 3 x a week, Denies having significant leg edema   when she does retain fluid, she puts fluid in ABD and chest,  Reports that she currently feels okay  Hemoglobin A1c continues to run greater than 11  She reports having recent CT scan through [place]. Results not available  Frustrated that she does not feel well when she walks, She is interested in retrying pulmonary rehabilitation She tried rehabilitation one time but was discouraged by her inability to exert herself. Continued symptoms of shortness of breath on exertion Reports she is scheduled to have a walker delivered  EKG on today's visit shows normal sinus rhythm with rate 85 bpm, no significant ST or T-wave changes  Other past medical history reviewed Previously had  follow-up with oncology for iron supplement.  Low iron felt secondary to heavy menses. Lab work from October 2016 showing hemoglobin A1c of 13,  Most recent hematocrit improved up to 33   she denies any symptoms of sleep apnea, reports that she had a test several years ago.     normal LV function by  echo   PMH:   has a past medical history of Asthma; CHF (congestive heart failure) ([place]); Diabetes mellitus without complication ([place]); Hypertension; Iron deficiency anemia; Pericardial effusion; and Poorly controlled diabetes mellitus ([place]).  PSH:    Past Surgical History:  Procedure Laterality Date  . CESAREAN SECTION      Current Outpatient Prescriptions  Medication Sig Dispense Refill  . acetaminophen (TYLENOL) 500 MG tablet Take 1,000 mg by mouth every 6 (six) hours as needed for mild pain, fever or headache.    . albuterol (PROVENTIL HFA;VENTOLIN HFA) 108 (90 BASE) MCG/ACT inhaler Inhale 2 puffs into the lungs every 6 (six) hours as needed for wheezing or shortness of breath. 1 Inhaler 0  . amLODipine (NORVASC) 10 MG tablet Take 1 tablet (10 mg total) by mouth daily. 30 tablet 0  . aspirin EC 81 MG tablet Take 81 mg by mouth at bedtime.    . budesonide (PULMICORT) 0.25 MG/2ML nebulizer solution Take 2 mLs (0.25 mg total) by nebulization 2 (two) times daily. 60 mL 12  . citalopram (CELEXA) 40 MG tablet Take 40 mg by mouth daily.    [name] guaiFENesin-dextromethorphan (ROBITUSSIN DM) 100-10 MG/5ML syrup Take 5 mLs by mouth every 4 (four) hours as needed for cough. 118 mL 0  . hydrALAZINE (APRESOLINE) 100 MG tablet Take 1 tablet (100 mg total) by mouth every 8 (eight) hours. 60 tablet 0  . insulin aspart (NOVOLOG FLEXPEN) 100 UNIT/ML FlexPen Inject 0-12 Units into the skin 3 (three) times  daily with meals as needed for high blood sugar. Pt uses as needed per sliding scale.    . Insulin Degludec (TRESIBA FLEXTOUCH) 100 UNIT/ML SOPN Inject 45 Units into the skin at bedtime. 1 pen 2  . ipratropium-albuterol (DUONEB) 0.5-2.5 (3) MG/3ML SOLN Take 3 mLs by nebulization every 6 (six) hours as needed (for wheezing/shortness of breath).    . losartan (COZAAR) 50 MG tablet Take 1 tablet (50 mg total) by mouth daily. 30 tablet 0  . metoprolol (LOPRESSOR) 50 MG tablet Take 50 mg by mouth 2 (two) times  daily.    . metoprolol tartrate (LOPRESSOR) 25 MG tablet Take 25 mg by mouth 2 (two) times daily.    . potassium chloride (K-DUR,KLOR-CON) 10 MEQ tablet Take 1 tablet (10 mEq total) by mouth 2 (two) times daily. 30 tablet 0  . simvastatin (ZOCOR) 40 MG tablet Take 40 mg by mouth at bedtime.    . torsemide (DEMADEX) 20 MG tablet Take 2 tablets (40 mg total) by mouth daily. 30 tablet 0   No current facility-administered medications for this visit.      Allergies:   Feraheme [ferumoxytol] and Sulfa antibiotics   Social History:  The patient  reports that she has never smoked. She has never used smokeless tobacco. She reports that she drinks alcohol. She reports that she does not use drugs.   Family History:   family history includes Breast cancer (age of onset: 47) in her maternal aunt and maternal aunt; Diabetes in her father and mother; Hypertension in her father and mother.    Review of Systems: Review of Systems  Constitutional: Negative.   Respiratory: Positive for shortness of breath.   Cardiovascular: Negative.   Gastrointestinal: Negative.   Musculoskeletal: Negative.        Leg weakness, gait instability  Neurological: Negative.   Psychiatric/Behavioral: Negative.   All other systems reviewed and are negative.    PHYSICAL EXAM: VS:  BP (!) 146/90 (BP Location: Left Arm, Patient Position: Sitting, Cuff Size: Large)   Pulse 87   Ht 5' 4.5" (1.638 m)   Wt (!) 351 lb 12.8 oz (159.6 kg)   SpO2 90%   BMI 59.45 kg/m  , BMI Body mass index is 59.45 kg/m. GEN: Well nourished, well developed, in no acute distress, morbidly obese , sitting in a wheelchair HEENT: normal  Neck: no JVD, carotid bruits, or masses Cardiac: RRR; no murmurs, rubs, or gallops,no edema  Respiratory:  clear to auscultation bilaterally, normal work of breathing GI: soft, nontender, nondistended, + BS MS: no deformity or atrophy  Skin: warm and dry, no rash Neuro:  Strength and sensation are  intact Psych: euthymic mood, full affect    Recent Labs: 08/09/2015: ALT 19; B Natriuretic Peptide 148.0; TSH 3.193 08/11/2015: Magnesium 1.8 08/12/2015: BUN 22; Creatinine, Ser 1.09; Potassium 3.9; Sodium 136 12/04/2015: Hemoglobin 10.5; Platelets 319    Lipid Panel Lab Results  Component Value Date   CHOL 232 (H) 01/03/2015   HDL 41 01/03/2015   LDLCALC 169 (H) 01/03/2015   TRIG 112 01/03/2015      Wt Readings from Last 3 Encounters:  12/25/15 (!) 351 lb 12.8 oz (159.6 kg)  12/04/15 (!) 339 lb 11.7 oz (154.1 kg)  11/17/15 (!) 350 lb (158.8 kg)       ASSESSMENT AND PLAN:  Chronic diastolic congestive heart failure ([place]) - Plan: EKG 12-Lead, Ambulatory referral to Pulmonology She appears relatively euvolemic on today's visit Encouraged her to stay on torsemide  3 days per week, extra torsemide for any leg swelling, abdominal bloating  Essential hypertension - Plan: EKG 12-Lead, Ambulatory referral to Pulmonology Blood pressure mildly elevated on arrival, improved on recheck No changes made to her medication  Asthma, unspecified asthma severity, unspecified whether complicated, unspecified whether persistent - Plan: Ambulatory referral to Pulmonology Currently on oxygen, appears to have chronic bronchitis, she reports history of asthma. Reports that she was previously on CPAP but her machine broke. We will refer to pulmonary for further consultation  On home oxygen therapy - Plan: Ambulatory referral to Pulmonology Started while she was in the hospital  Morbid obesity due to excess calories [facility]) Previously was thinking about gastric bypass surgery. History of poor diet  Poorly controlled type 2 diabetes mellitus ([place]) Hemoglobin A1c continues to be reported controlled She may benefit from referral to endocrinology Needs to work with nutritionist  Shortness of breath Stable symptoms, on oxygen, appears to have chronic bronchitis, thick cough. Less likely CHF Will  refer to pulmonary  Iron deficiency anemia due to chronic blood loss Followed by Dr. Grayland Ormond, receiving iron infusions She reports heavy menstrual cycles Unclear she may benefit from just on only birth control medication to minimize blood loss. Recommended she discuss with GYN   Total encounter time more than 25 minutes  Greater than 50% was spent in counseling and coordination of care with the patient   Disposition:   F/U  6 months   Orders Placed This Encounter  Procedures  . Ambulatory referral to Pulmonology  . EKG 12-Lead     Signed, Esmond Plants, M.D., Ph.D. 12/25/2015  [place], [place]

## 2015-12-25 NOTE — Patient Instructions (Addendum)
Call the office if you would like "Lung Works"  Pulmonary consult for phlegm, asthma, getting over Pneumonia,  On oxygen  Medication Instructions:   No medication changes made  Labwork:  No new labs needed  Testing/Procedures:  No further testing at this time   Follow-Up: It was a pleasure seeing you in the office today. Please call us if you have new issues that need to be addressed before your next appt.  [phone]  Your physician wants you to follow-up in: 6 months.  You will receive a reminder letter in the mail two months in advance. If you don't receive a letter, please call our office to schedule the follow-up appointment.  If you need a refill on your cardiac medications before your next appointment, please call your pharmacy.

## 2016-01-02 NOTE — Progress Notes (Signed)
[name]      Assessment and Plan:  Asthma. -Patient is asked to use Pulmicort nebulized once daily, and use nebulized albuterol and Ventolin as needed. -I doubt that asthma is the major driver of this patient's dyspnea. -Patient is being sent for a Pulmicort function test.  Dyspnea with chronic hypoxic respiratory failure. -I suspect that the major driver of her dyspnea is morbid obesity, restrictive lung disease related to obesity, and pulmonary restriction from cardiomegaly.  Morbid obesity. -We had a long discussion regarding effective obesity. Hasn't her breathing, including reviewing images with her and her husband which showed the contribution of obesity to Pulmonary restriction. -She is being referred to nutrition for nutrition counseling.  Deconditioning. -Likely contributing to dyspnea. -We referred the patient back to pulmonary rehabilitation.  Chronic diastolic congestive heart failure with cardiomegaly, history of pericardial effusion. -Continue to follow up with cardiology.  Date: 01/02/2016  MRN# [id number] [name] Nov 26, 1970  Referring Physician:   CHASE [name] is a 45 y.o. old female seen in consultation for chief complaint of:    Chief Complaint  Patient presents with  . pulmonary consult    per Dr. Rockey Situ. pt started on 3L during her June 2017 admission for CHF. pt c/o sob with exertion, dry cough at times prod cough clear to yellow mucus, occ wheezing & chest heavines.      HPI:   45 yo morbidly obese woman with uncontrolled diabetes with history of chronic diastolic CHF, hospitalization October 2016, November 2016.   She ntoes that she has chronic dyspnea with chronic CHF. She has been diagnosed with dypsnea when she was a child. She is currently on proair inhaler which she uses 2 to 3 times per day. She also uses albuterol and pulmicort via a nebulizer, though she does not use the pulmicort very often but she  does use the albuterol.   She is on oxygen since her discharged from the hospital, she is now on 3L, she currently has some congestion and mucus. She checks her oxygen at home, without the oxygen at rest her sat will be at 95% but if she moves around it will drop.   She was referred to pulmonary rehab, she went one day, but never went back. She notes that she was depressed because she saw that there were a lot of older people who were exercising more than she could.    Review of images chest x-ray 2 view 08/09/15; diffuse interstitial changes, with cephalization. Obesity. Differential includes pulmonary edema due to congestive heart failure versus interstitial lung disease. CT chest 12/15/14: Bibasilar atelectasis, morbid obesity, cardiomegaly, enlarged left atrium, normal RV. Small pericardial effusion  Echocardiogram 12/15/14: EF equals 55%, mild left atrial dilation, small to moderate pericardial effusion, no TR noted.   PMHX:   Past Medical History:  Diagnosis Date  . Asthma   . CHF (congestive heart failure) ([place])   . Diabetes mellitus without complication ([place])   . Hypertension   . Iron deficiency anemia   . Pericardial effusion    a. 11/2014 CT Chest: Mod effusion.  [name] Poorly controlled diabetes mellitus ([place])    a. 11/2014 A1c 13.2.   Surgical Hx:  Past Surgical History:  Procedure Laterality Date  . CESAREAN SECTION     Family Hx:  Family History  Problem Relation Age of Onset  . Diabetes Mother   . Hypertension Mother   . Hypertension Father   . Diabetes Father   .  Hypertension    . Diabetes    . Breast cancer Maternal Aunt 51  . Breast cancer Maternal Aunt 50   Social Hx:   Social History  Substance Use Topics  . Smoking status: Never Smoker  . Smokeless tobacco: Never Used  . Alcohol use 0.0 oz/week   Medication:   Reviewed.     Allergies:  Feraheme [ferumoxytol] and Sulfa antibiotics  Review of Systems: Gen:  Denies  fever, sweats, chills HEENT:  Denies blurred vision, double vision. bleeds, sore throat Cvc:  No dizziness, chest pain. Resp:   Denies cough or sputum production. Gi: Denies swallowing difficulty, stomach pain. Gu:  Denies bladder incontinence, burning urine Ext:   No Joint pain, stiffness. Skin: No skin rash,  hives  Endoc:  No polyuria, polydipsia. Psych: No depression, insomnia. Other:  All other systems were reviewed with the patient and were negative other that what is mentioned in the HPI.   Physical Examination:   VS: BP 136/74 (BP Location: Left Arm, Cuff Size: Normal)   Pulse 86   Ht 5' 4.5" (1.638 m)   Wt (!) 380 lb 9.6 oz (172.6 kg)   SpO2 92%   BMI 64.32 kg/m   General Appearance: No distress  Neuro:without focal findings,  speech normal,  HEENT: PERRLA, EOM intact.   Pulmonary: normal breath sounds, No wheezing.  CardiovascularNormal S1,S2.  No m/r/g.   Abdomen: Benign, Soft, non-tender. Renal:  No costovertebral tenderness  GU:  No performed at this time. Endoc: No evident thyromegaly, no signs of acromegaly. Skin:   warm, no rashes, no ecchymosis  Extremities: normal, no cyanosis, clubbing.  Other findings:    LABORATORY PANEL:   CBC No results for input(s): WBC, HGB, HCT, PLT in the last 168 hours. ------------------------------------------------------------------------------------------------------------------  Chemistries  No results for input(s): NA, K, CL, CO2, GLUCOSE, BUN, CREATININE, CALCIUM, MG, AST, ALT, ALKPHOS, BILITOT in the last 168 hours.  Invalid input(s): GFRCGP ------------------------------------------------------------------------------------------------------------------  Cardiac Enzymes No results for input(s): TROPONINI in the last 168 hours. ------------------------------------------------------------  RADIOLOGY:  No results found.     Thank  you for the consultation and for allowing [facility] to assist in the care of your  patient. Our recommendations are noted above.  Please contact us if we can be of further service.   Marda Stalker, MD.  Board Certified in Internal Medicine, Pulmonary Medicine, [place], and Sleep Medicine.  [facility] Office Number: [phone]  Patricia Pesa, M.D.  Vilinda Boehringer, M.D.  Merton Border, M.D  01/02/2016

## 2016-01-04 ENCOUNTER — Ambulatory Visit (INDEPENDENT_AMBULATORY_CARE_PROVIDER_SITE_OTHER): Payer: Medicaid Other | Admitting: Internal Medicine

## 2016-01-04 ENCOUNTER — Ambulatory Visit: Payer: Medicaid Other | Admitting: Family

## 2016-01-04 ENCOUNTER — Encounter: Payer: Self-pay | Admitting: Internal Medicine

## 2016-01-04 DIAGNOSIS — R0602 Shortness of breath: Secondary | ICD-10-CM

## 2016-01-04 DIAGNOSIS — J984 Other disorders of lung: Secondary | ICD-10-CM | POA: Diagnosis not present

## 2016-01-04 NOTE — Patient Instructions (Addendum)
--  Weight loss is the most important thing that you can do for your health.   --Use pulmicort once every day, and use albuterol in the nebulized as needed. Rinse mouth after using pulmicort.   --Will check full pulmonary function test before next visit in 3 months.   --Refer to nutrition for dietary counseling.   --Refer back to pulmonary rehab.

## 2016-01-15 ENCOUNTER — Encounter: Payer: Medicaid Other | Attending: Internal Medicine | Admitting: Respiratory Therapy

## 2016-01-15 DIAGNOSIS — J45909 Unspecified asthma, uncomplicated: Secondary | ICD-10-CM

## 2016-01-15 DIAGNOSIS — J449 Chronic obstructive pulmonary disease, unspecified: Secondary | ICD-10-CM | POA: Diagnosis present

## 2016-01-15 DIAGNOSIS — I509 Heart failure, unspecified: Secondary | ICD-10-CM | POA: Diagnosis not present

## 2016-01-15 DIAGNOSIS — I5032 Chronic diastolic (congestive) heart failure: Secondary | ICD-10-CM

## 2016-01-15 NOTE — Patient Instructions (Signed)
Patient Instructions  Patient Details  Name: [name] MRN: [id number] Date of Birth: 1970-08-28 Referring Provider:  Laverle Hobby, *  Below are the personal goals you chose as well as exercise and nutrition goals. Our goal is to help you keep on track towards obtaining and maintaining your goals. We will be discussing your progress on these goals with you throughout the program.  Initial Exercise Prescription:     Initial Exercise Prescription - 01/15/16 1600      Date of Initial Exercise RX and Referring Provider   Date 01/15/16   Referring Provider Ramashandran MD     Oxygen   Oxygen Continuous   Liters 4     Treadmill   MPH 0.5   Grade 0   Minutes 3  1 min intervals   METs 1.5     T5 Nustep   Level 1   Watts --  60-80 spm   Minutes 15   METs 1.5     Biostep-RELP   Level 1   Watts --  40-50 spm   Minutes 15   METs 1     Prescription Details   Frequency (times per week) 3   Duration Progress to 45 minutes of aerobic exercise without signs/symptoms of physical distress     Intensity   THRR 40-80% of Max Heartrate 120-157   Ratings of Perceived Exertion 11-15   Perceived Dyspnea 0-4     Progression   Progression Continue to progress workloads to maintain intensity without signs/symptoms of physical distress.     Resistance Training   Training Prescription Yes   Weight 2 lbs   Reps 10-12      Exercise Goals: Frequency: Be able to perform aerobic exercise three times per week working toward 3-5 days per week.  Intensity: Work with a perceived exertion of 11 (fairly light) - 15 (hard) as tolerated. Follow your new exercise prescription and watch for changes in prescription as you progress with the program. Changes will be reviewed with you when they are made.  Duration: You should be able to do 30 minutes of continuous aerobic exercise in addition to a 5 minute warm-up and a 5 minute cool-down routine.  Nutrition Goals: Your personal  nutrition goals will be established when you do your nutrition analysis with the dietician.  The following are nutrition guidelines to follow: Cholesterol < 200mg /day Sodium < 1500mg /day Fiber: Women under 50 yrs - 25 grams per day  Personal Goals:     Personal Goals and Risk Factors at Admission - 01/15/16 1619      Core Components/Risk Factors/Patient Goals on Admission    Weight Management Yes;Obesity;Weight Loss   Intervention Weight Management: Develop a combined nutrition and exercise program designed to reach desired caloric intake, while maintaining appropriate intake of nutrient and fiber, sodium and fats, and appropriate energy expenditure required for the weight goal.;Weight Management: Provide education and appropriate resources to help participant work on and attain dietary goals.;Weight Management/Obesity: Establish reasonable short term and long term weight goals.;Obesity: Provide education and appropriate resources to help participant work on and attain dietary goals.   Admit Weight 358 lb (162.4 kg)   Goal Weight: Short Term 355 lb (161 kg)   Goal Weight: Long Term 300 lb (136.1 kg)   Expected Outcomes Long Term: Adherence to nutrition and physical activity/exercise program aimed toward attainment of established weight goal;Weight Maintenance: Understanding of the daily nutrition guidelines, which includes 25-35% calories from fat, 7% or less cal from saturated  fats, less than 200mg  cholesterol, less than 1.5gm of sodium, & 5 or more servings of fruits and vegetables daily;Weight Loss: Understanding of general recommendations for a balanced deficit meal plan, which promotes 1-2 lb weight loss per week and includes a negative energy balance of [phone] kcal/d;Understanding recommendations for meals to include 15-35% energy as protein, 25-35% energy from fat, 35-60% energy from carbohydrates, less than 200mg  of dietary cholesterol, 20-35 gm of total fiber daily;Understanding of  distribution of calorie intake throughout the day with the consumption of 4-5 meals/snacks   Sedentary Yes   Intervention Provide advice, education, support and counseling about physical activity/exercise needs.;Develop an individualized exercise prescription for aerobic and resistive training based on initial evaluation findings, risk stratification, comorbidities and participant's personal goals.   Expected Outcomes Achievement of increased cardiorespiratory fitness and enhanced flexibility, muscular endurance and strength shown through measurements of functional capacity and personal statement of participant.   Increase Strength and Stamina Yes   Intervention Provide advice, education, support and counseling about physical activity/exercise needs.;Develop an individualized exercise prescription for aerobic and resistive training based on initial evaluation findings, risk stratification, comorbidities and participant's personal goals.   Expected Outcomes Achievement of increased cardiorespiratory fitness and enhanced flexibility, muscular endurance and strength shown through measurements of functional capacity and personal statement of participant.   Improve shortness of breath with ADL's Yes   Intervention Provide education, individualized exercise plan and daily activity instruction to help decrease symptoms of SOB with activities of daily living.   Expected Outcomes Short Term: Achieves a reduction of symptoms when performing activities of daily living.   Develop more efficient breathing techniques such as purse lipped breathing and diaphragmatic breathing; and practicing self-pacing with activity Yes   Intervention Provide education, demonstration and support about specific breathing techniuqes utilized for more efficient breathing. Include techniques such as pursed lipped breathing, diaphragmatic breathing and self-pacing activity.   Expected Outcomes Short Term: Participant will be able to  demonstrate and use breathing techniques as needed throughout daily activities.   Increase knowledge of respiratory medications and ability to use respiratory devices properly  Yes   Intervention Provide education and demonstration as needed of appropriate use of medications, inhalers, and oxygen therapy.   Expected Outcomes Short Term: Achieves understanding of medications use. Understands that oxygen is a medication prescribed by physician. Demonstrates appropriate use of inhaler and oxygen therapy.   Diabetes Yes   Intervention Provide education about signs/symptoms and action to take for hypo/hyperglycemia.;Provide education about proper nutrition, including hydration, and aerobic/resistive exercise prescription along with prescribed medications to achieve blood glucose in normal ranges: Fasting glucose 65-99 mg/dL   Expected Outcomes Short Term: Participant verbalizes understanding of the signs/symptoms and immediate care of hyper/hypoglycemia, proper foot care and importance of medication, aerobic/resistive exercise and nutrition plan for blood glucose control.;Long Term: Attainment of HbA1C < 7%.   Heart Failure Yes   Intervention Provide a combined exercise and nutrition program that is supplemented with education, support and counseling about heart failure. Directed toward relieving symptoms such as shortness of breath, decreased exercise tolerance, and extremity edema.   Expected Outcomes Improve functional capacity of life;Short term: Attendance in program 2-3 days a week with increased exercise capacity. Reported lower sodium intake. Reported increased fruit and vegetable intake. Reports medication compliance.;Short term: Daily weights obtained and reported for increase. Utilizing diuretic protocols set by physician.;Long term: Adoption of self-care skills and reduction of barriers for early signs and symptoms recognition and intervention leading to self-care maintenance.  Hypertension Yes    Intervention Provide education on lifestyle modifcations including regular physical activity/exercise, weight management, moderate sodium restriction and increased consumption of fresh fruit, vegetables, and low fat dairy, alcohol moderation, and smoking cessation.;Monitor prescription use compliance.   Expected Outcomes Short Term: Continued assessment and intervention until BP is < 140/27mm HG in hypertensive participants. < 130/39mm HG in hypertensive participants with diabetes, heart failure or chronic kidney disease.;Long Term: Maintenance of blood pressure at goal levels.   Lipids Yes   Intervention Provide education and support for participant on nutrition & aerobic/resistive exercise along with prescribed medications to achieve LDL 70mg , HDL >40mg .   Expected Outcomes Short Term: Participant states understanding of desired cholesterol values and is compliant with medications prescribed. Participant is following exercise prescription and nutrition guidelines.;Long Term: Cholesterol controlled with medications as prescribed, with individualized exercise RX and with personalized nutrition plan. Value goals: LDL < 70mg , HDL > 40 mg.      Tobacco Use Initial Evaluation: History  Smoking Status  . Never Smoker  Smokeless Tobacco  . Never Used    Copy of goals given to participant.

## 2016-01-15 NOTE — Progress Notes (Signed)
Pulmonary Individual Treatment Plan  Patient Details  Name: [name] MRN: [id number] Date of Birth: 25-Aug-1970 Referring Provider:   Flowsheet Row Pulmonary Rehab from 01/15/2016 in [facility] Cardiac and Pulmonary Rehab  Referring Provider  Ramashandran MD      Initial Encounter Date:  Flowsheet Row Pulmonary Rehab from 01/15/2016 in [facility] Cardiac and Pulmonary Rehab  Date  01/15/16  Referring Provider  Ramashandran MD      Visit Diagnosis: Asthma, unspecified asthma severity, unspecified whether complicated, unspecified whether persistent  Chronic diastolic congestive heart failure ([place])  [name] Home Medications on Admission:  Current Outpatient Prescriptions:  .  acetaminophen (TYLENOL) 500 MG tablet, Take 1,000 mg by mouth every 6 (six) hours as needed for mild pain, fever or headache., Disp: , Rfl:  .  albuterol (PROVENTIL HFA;VENTOLIN HFA) 108 (90 BASE) MCG/ACT inhaler, Inhale 2 puffs into the lungs every 6 (six) hours as needed for wheezing or shortness of breath., Disp: 1 Inhaler, Rfl: 0 .  amLODipine (NORVASC) 10 MG tablet, Take 1 tablet (10 mg total) by mouth daily., Disp: 30 tablet, Rfl: 0 .  aspirin EC 81 MG tablet, Take 81 mg by mouth at bedtime., Disp: , Rfl:  .  budesonide (PULMICORT) 0.25 MG/2ML nebulizer solution, Take 2 mLs (0.25 mg total) by nebulization 2 (two) times daily., Disp: 60 mL, Rfl: 12 .  citalopram (CELEXA) 40 MG tablet, Take 40 mg by mouth daily., Disp: , Rfl:  .  guaiFENesin-dextromethorphan (ROBITUSSIN DM) 100-10 MG/5ML syrup, Take 5 mLs by mouth every 4 (four) hours as needed for cough., Disp: 118 mL, Rfl: 0 .  hydrALAZINE (APRESOLINE) 100 MG tablet, Take 1 tablet (100 mg total) by mouth every 8 (eight) hours., Disp: 60 tablet, Rfl: 0 .  insulin aspart (NOVOLOG FLEXPEN) 100 UNIT/ML FlexPen, Inject 0-12 Units into the skin 3 (three) times daily with meals as needed for high blood sugar. Pt uses as needed per sliding scale., Disp: , Rfl:  .  Insulin  Degludec (TRESIBA FLEXTOUCH) 100 UNIT/ML SOPN, Inject 45 Units into the skin at bedtime., Disp: 1 pen, Rfl: 2 .  ipratropium-albuterol (DUONEB) 0.5-2.5 (3) MG/3ML SOLN, Take 3 mLs by nebulization every 6 (six) hours as needed (for wheezing/shortness of breath)., Disp: , Rfl:  .  losartan (COZAAR) 50 MG tablet, Take 1 tablet (50 mg total) by mouth daily., Disp: 30 tablet, Rfl: 0 .  metoprolol (LOPRESSOR) 50 MG tablet, Take 50 mg by mouth 2 (two) times daily., Disp: , Rfl:  .  potassium chloride (K-DUR,KLOR-CON) 10 MEQ tablet, Take 1 tablet (10 mEq total) by mouth 2 (two) times daily., Disp: 30 tablet, Rfl: 0 .  simvastatin (ZOCOR) 40 MG tablet, Take 40 mg by mouth at bedtime., Disp: , Rfl:  .  torsemide (DEMADEX) 20 MG tablet, Take 2 tablets (40 mg total) by mouth daily., Disp: 30 tablet, Rfl: 0  Past Medical History: Past Medical History:  Diagnosis Date  . Asthma   . CHF (congestive heart failure) ([place])   . Diabetes mellitus without complication ([place])   . Hypertension   . Iron deficiency anemia   . Pericardial effusion    a. 11/2014 CT Chest: Mod effusion.  [name] Poorly controlled diabetes mellitus ([place])    a. 11/2014 A1c 13.2.    Tobacco Use: History  Smoking Status  . Never Smoker  Smokeless Tobacco  . Never Used    Labs: Recent Review Flowsheet Data    Labs for ITP Cardiac and Pulmonary Rehab Latest Ref Rng &  Units 12/15/2014 01/03/2015 08/09/2015   Cholestrol 0 - 200 mg/dL - 232(H) -   LDLCALC 0 - 99 mg/dL - 169(H) -   HDL >40 mg/dL - 41 -   Trlycerides <150 mg/dL - 112 -   Hemoglobin A1c 4.0 - 6.0 % 13.2(H) - 10.7(H)   HCO3 21.0 - 28.0 mEq/L - - 36.4(H)   O2SAT % - - 80.3       ADL UCSD:     Pulmonary Assessment Scores    Row Name 01/15/16 1615         ADL UCSD   ADL Phase Entry     SOB Score total 118     Rest 3     Walk 5     Stairs 5     Bath 5     Dress 5     Shop 5        Pulmonary Function Assessment:     Pulmonary Function Assessment -  01/15/16 1614      Pulmonary Function Tests   FVC% 27 %   FEV1% 26 %   FEV1/FVC Ratio 79.61     Initial Spirometry Results   Comments Test date 01/15/16     Breath   Bilateral Breath Sounds Clear;Decreased   Shortness of Breath Yes;Limiting activity;Panic with Shortness of Breath;Fear of Shortness of Breath      Exercise Target Goals: Date: 01/15/16  Exercise Program Goal: Individual exercise prescription set with THRR, safety & activity barriers. Participant demonstrates ability to understand and report RPE using BORG scale, to self-measure pulse accurately, and to acknowledge the importance of the exercise prescription.  Exercise Prescription Goal: Starting with aerobic activity 30 plus minutes a day, 3 days per week for initial exercise prescription. Provide home exercise prescription and guidelines that participant acknowledges understanding prior to discharge.  Activity Barriers & Risk Stratification:     Activity Barriers & Cardiac Risk Stratification - 01/15/16 1613      Activity Barriers & Cardiac Risk Stratification   Activity Barriers Joint Problems;Deconditioning;Muscular Weakness;Shortness of Breath;Assistive Device;Balance Concerns  also has a fear of falling, uses rollator at home and was encouraged to bring it to rehab.   Cardiac Risk Stratification Moderate      6 Minute Walk:     6 Minute Walk    Row Name 01/15/16 1637         6 Minute Walk   Phase Initial     Distance 140 feet     Walk Time 2.33 minutes     # of Rest Breaks 2  1:26, stopped at 2:56      MPH 0.68     METS 1.5     RPE 12     Perceived Dyspnea  5     VO2 Peak 1.99     Symptoms Yes (comment)     Comments hurting pain in legs and back (8/10), anxiety about falling     Resting HR 83 bpm     Resting BP 166/60     Max Ex. HR 90 bpm     Max Ex. BP 152/54     2 Minute Post BP 134/60       Interval HR   Baseline HR 83     1 Minute HR 85     2 Minute HR 84     3 Minute HR 90      4 Minute HR 86     5 Minute HR 84  6 Minute HR 84     2 Minute Post HR 82     Interval Heart Rate? Yes       Interval Oxygen   Interval Oxygen? Yes     Baseline Oxygen Saturation % 96 %  was 88% on her pulsed 3L     Baseline Liters of Oxygen 4 L  switched to e-cylinder continuous flow     1 Minute Oxygen Saturation % 95 %  rest break 30-1:56     1 Minute Liters of Oxygen 4 L     2 Minute Oxygen Saturation % 94 %     2 Minute Liters of Oxygen 4 L     3 Minute Oxygen Saturation % 94 %  pt sat down at 2:56     3 Minute Liters of Oxygen 4 L     4 Minute Oxygen Saturation % 96 %     4 Minute Liters of Oxygen 4 L     5 Minute Oxygen Saturation % 98 %     5 Minute Liters of Oxygen 4 L     6 Minute Oxygen Saturation % 100 %     6 Minute Liters of Oxygen 4 L     2 Minute Post Oxygen Saturation % 100 %     2 Minute Post Liters of Oxygen 4 L        Initial Exercise Prescription:     Initial Exercise Prescription - 01/15/16 1600      Date of Initial Exercise RX and Referring Provider   Date 01/15/16   Referring Provider Ramashandran MD     Oxygen   Oxygen Continuous   Liters 4     Treadmill   MPH 0.5   Grade 0   Minutes 3  1 min intervals   METs 1.5     T5 Nustep   Level 1   Watts --  60-80 spm   Minutes 15   METs 1.5     Biostep-RELP   Level 1   Watts --  40-50 spm   Minutes 15   METs 1     Prescription Details   Frequency (times per week) 3   Duration Progress to 45 minutes of aerobic exercise without signs/symptoms of physical distress     Intensity   THRR 40-80% of Max Heartrate 120-157   Ratings of Perceived Exertion 11-15   Perceived Dyspnea 0-4     Progression   Progression Continue to progress workloads to maintain intensity without signs/symptoms of physical distress.     Resistance Training   Training Prescription Yes   Weight 2 lbs   Reps 10-12      Perform Capillary Blood Glucose checks as needed.  Exercise Prescription  Changes:   Exercise Comments:     Exercise Comments    Row Name 01/15/16 1642           Exercise Comments [name] wants to get her independence back and to be able to get out more.  She has been spending a lot of time in her bed.  I talked with her and her husband about getting up and moving out to the living room to sit in the chair with her feet propped up to help start to increase her strength and stamina.          Discharge Exercise Prescription (Final Exercise Prescription Changes):    Nutrition:  Target Goals: Understanding of nutrition guidelines, daily intake of sodium 1500mg , cholesterol 200mg ,  calories 30% from fat and 7% or less from saturated fats, daily to have 5 or more servings of fruits and vegetables.  Biometrics:    Nutrition Therapy Plan and Nutrition Goals:   Nutrition Discharge: Rate Your Plate Scores:   Psychosocial: Target Goals: Acknowledge presence or absence of depression, maximize coping skills, provide positive support system. Participant is able to verbalize types and ability to use techniques and skills needed for reducing stress and depression.  Initial Review & Psychosocial Screening:     Initial Psych Review & Screening - 01/15/16 1623      Family Dynamics   Good Support System? Yes   Comments Ms Defoor has good support from her family and knows that she has to make changes to her life for a healthier life for her family and herself.     Barriers   Psychosocial barriers to participate in program The patient should benefit from training in stress management and relaxation.     Screening Interventions   Interventions Encouraged to exercise;Program counselor consult      Quality of Life Scores:     Quality of Life - 01/15/16 1625      Quality of Life Scores   Health/Function Pre 20.75 %   Socioeconomic Pre 20.43 %   Psych/Spiritual Pre 21 %   Family Pre 20.6 %   GLOBAL Pre 20.71 %      PHQ-9: Recent Review Flowsheet  Data    Depression screen [facility] 2/9 01/15/2016 10/02/2015 08/30/2015 05/02/2015   Decreased Interest 2 0 0 2   Down, Depressed, Hopeless 1 0 0 1   PHQ - 2 Score 3 0 0 3   Altered sleeping 3 - - 2   Tired, decreased energy 3 - - 3   Change in appetite 2 - - 1   Feeling bad or failure about yourself  2 - - 2   Trouble concentrating 2 - - 2   Moving slowly or fidgety/restless 1 - - 1   Suicidal thoughts 1 - - 1   PHQ-9 Score 17 - - 15   Difficult doing work/chores Very difficult - - Somewhat difficult      Psychosocial Evaluation and Intervention:   Psychosocial Re-Evaluation:  Education: Education Goals: Education classes will be provided on a weekly basis, covering required topics. Participant will state understanding/return demonstration of topics presented.  Learning Barriers/Preferences:     Learning Barriers/Preferences - 01/15/16 1613      Learning Barriers/Preferences   Learning Barriers None   Learning Preferences None      Education Topics: Initial Evaluation Education: - Verbal, written and demonstration of respiratory meds, RPE/PD scales, oximetry and breathing techniques. Instruction on use of nebulizers and MDIs: cleaning and proper use, rinsing mouth with steroid doses and importance of monitoring MDI activations. Flowsheet Row Pulmonary Rehab from 01/15/2016 in [facility] Cardiac and Pulmonary Rehab  Date  01/15/16  Educator  LB  Instruction Review Code  2- meets goals/outcomes      General Nutrition Guidelines/Fats and Fiber: -Group instruction provided by verbal, written material, models and posters to present the general guidelines for heart healthy nutrition. Gives an explanation and review of dietary fats and fiber.   Controlling Sodium/Reading Food Labels: -Group verbal and written material supporting the discussion of sodium use in heart healthy nutrition. Review and explanation with models, verbal and written materials for utilization of the food  label.   Exercise Physiology & Risk Factors: - Group verbal and written instruction with models  to review the exercise physiology of the cardiovascular system and associated critical values. Details cardiovascular disease risk factors and the goals associated with each risk factor.   Aerobic Exercise & Resistance Training: - Gives group verbal and written discussion on the health impact of inactivity. On the components of aerobic and resistive training programs and the benefits of this training and how to safely progress through these programs.   Flexibility, Balance, General Exercise Guidelines: - Provides group verbal and written instruction on the benefits of flexibility and balance training programs. Provides general exercise guidelines with specific guidelines to those with heart or lung disease. Demonstration and skill practice provided.   Stress Management: - Provides group verbal and written instruction about the health risks of elevated stress, cause of high stress, and healthy ways to reduce stress.   Depression: - Provides group verbal and written instruction on the correlation between heart/lung disease and depressed mood, treatment options, and the stigmas associated with seeking treatment.   Exercise & Equipment Safety: - Individual verbal instruction and demonstration of equipment use and safety with use of the equipment.   Infection Prevention: - Provides verbal and written material to individual with discussion of infection control including proper hand washing and proper equipment cleaning during exercise session. Flowsheet Row Pulmonary Rehab from 01/15/2016 in [facility] Cardiac and Pulmonary Rehab  Date  01/15/16  Educator  LB  Instruction Review Code  2- meets goals/outcomes      Falls Prevention: - Provides verbal and written material to individual with discussion of falls prevention and safety. Flowsheet Row Pulmonary Rehab from 01/15/2016 in [facility] Cardiac and  Pulmonary Rehab  Date  01/15/16  Educator  LB  Instruction Review Code  2- meets goals/outcomes      Diabetes: - Individual verbal and written instruction to review signs/symptoms of diabetes, desired ranges of glucose level fasting, after meals and with exercise. Advice that pre and post exercise glucose checks will be done for 3 sessions at entry of program.   Chronic Lung Diseases: - Group verbal and written instruction to review new updates, new respiratory medications, new advancements in procedures and treatments. Provide informative websites and "800" numbers of self-education.   Lung Procedures: - Group verbal and written instruction to describe testing methods done to diagnose lung disease. Review the outcome of test results. Describe the treatment choices: Pulmonary Function Tests, ABGs and oximetry.   Energy Conservation: - Provide group verbal and written instruction for methods to conserve energy, plan and organize activities. Instruct on pacing techniques, use of adaptive equipment and posture/positioning to relieve shortness of breath.   Triggers: - Group verbal and written instruction to review types of environmental controls: home humidity, furnaces, filters, dust mite/pet prevention, HEPA vacuums. To discuss weather changes, air quality and the benefits of nasal washing.   Exacerbations: - Group verbal and written instruction to provide: warning signs, infection symptoms, calling MD promptly, preventive modes, and value of vaccinations. Review: effective airway clearance, coughing and/or vibration techniques. Create an Sports administrator.   Oxygen: - Individual and group verbal and written instruction on oxygen therapy. Includes supplement oxygen, available portable oxygen systems, continuous and intermittent flow rates, oxygen safety, concentrators, and Medicare reimbursement for oxygen. Flowsheet Row Pulmonary Rehab from 01/15/2016 in [facility] Cardiac and Pulmonary Rehab   Date  01/15/16  Educator  LB  Instruction Review Code  2- meets goals/outcomes      Respiratory Medications: - Group verbal and written instruction to review medications for lung disease. Drug class,  frequency, complications, importance of spacers, rinsing mouth after steroid MDI's, and proper cleaning methods for nebulizers. Flowsheet Row Pulmonary Rehab from 01/15/2016 in [facility] Cardiac and Pulmonary Rehab  Date  01/15/16  Educator  LB  Instruction Review Code  2- meets goals/outcomes      AED/CPR: - Group verbal and written instruction with the use of models to demonstrate the basic use of the AED with the basic ABC's of resuscitation.   Breathing Retraining: - Provides individuals verbal and written instruction on purpose, frequency, and proper technique of diaphragmatic breathing and pursed-lipped breathing. Applies individual practice skills. Flowsheet Row Pulmonary Rehab from 01/15/2016 in [facility] Cardiac and Pulmonary Rehab  Date  01/15/16  Educator  LB  Instruction Review Code  2- meets goals/outcomes      Anatomy and Physiology of the Lungs: - Group verbal and written instruction with the use of models to provide basic lung anatomy and physiology related to function, structure and complications of lung disease.   Heart Failure: - Group verbal and written instruction on the basics of heart failure: signs/symptoms, treatments, explanation of ejection fraction, enlarged heart and cardiomyopathy.   Sleep Apnea: - Individual verbal and written instruction to review Obstructive Sleep Apnea. Review of risk factors, methods for diagnosing and types of masks and machines for OSA.   Anxiety: - Provides group, verbal and written instruction on the correlation between heart/lung disease and anxiety, treatment options, and management of anxiety.   Relaxation: - Provides group, verbal and written instruction about the benefits of relaxation for patients with heart/lung disease.  Also provides patients with examples of relaxation techniques.   Knowledge Questionnaire Score:     Knowledge Questionnaire Score - 01/15/16 1613      Knowledge Questionnaire Score   Pre Score 10/10       Core Components/Risk Factors/Patient Goals at Admission:     Personal Goals and Risk Factors at Admission - 01/15/16 1619      Core Components/Risk Factors/Patient Goals on Admission    Weight Management Yes;Obesity;Weight Loss   Intervention Weight Management: Develop a combined nutrition and exercise program designed to reach desired caloric intake, while maintaining appropriate intake of nutrient and fiber, sodium and fats, and appropriate energy expenditure required for the weight goal.;Weight Management: Provide education and appropriate resources to help participant work on and attain dietary goals.;Weight Management/Obesity: Establish reasonable short term and long term weight goals.;Obesity: Provide education and appropriate resources to help participant work on and attain dietary goals.   Admit Weight 358 lb (162.4 kg)   Goal Weight: Short Term 355 lb (161 kg)   Goal Weight: Long Term 300 lb (136.1 kg)   Expected Outcomes Long Term: Adherence to nutrition and physical activity/exercise program aimed toward attainment of established weight goal;Weight Maintenance: Understanding of the daily nutrition guidelines, which includes 25-35% calories from fat, 7% or less cal from saturated fats, less than 200mg  cholesterol, less than 1.5gm of sodium, & 5 or more servings of fruits and vegetables daily;Weight Loss: Understanding of general recommendations for a balanced deficit meal plan, which promotes 1-2 lb weight loss per week and includes a negative energy balance of [phone] kcal/d;Understanding recommendations for meals to include 15-35% energy as protein, 25-35% energy from fat, 35-60% energy from carbohydrates, less than 200mg  of dietary cholesterol, 20-35 gm of total fiber  daily;Understanding of distribution of calorie intake throughout the day with the consumption of 4-5 meals/snacks   Sedentary Yes   Intervention Provide advice, education, support and counseling about physical  activity/exercise needs.;Develop an individualized exercise prescription for aerobic and resistive training based on initial evaluation findings, risk stratification, comorbidities and participant's personal goals.   Expected Outcomes Achievement of increased cardiorespiratory fitness and enhanced flexibility, muscular endurance and strength shown through measurements of functional capacity and personal statement of participant.   Increase Strength and Stamina Yes   Intervention Provide advice, education, support and counseling about physical activity/exercise needs.;Develop an individualized exercise prescription for aerobic and resistive training based on initial evaluation findings, risk stratification, comorbidities and participant's personal goals.   Expected Outcomes Achievement of increased cardiorespiratory fitness and enhanced flexibility, muscular endurance and strength shown through measurements of functional capacity and personal statement of participant.   Improve shortness of breath with ADL's Yes   Intervention Provide education, individualized exercise plan and daily activity instruction to help decrease symptoms of SOB with activities of daily living.   Expected Outcomes Short Term: Achieves a reduction of symptoms when performing activities of daily living.   Develop more efficient breathing techniques such as purse lipped breathing and diaphragmatic breathing; and practicing self-pacing with activity Yes   Intervention Provide education, demonstration and support about specific breathing techniuqes utilized for more efficient breathing. Include techniques such as pursed lipped breathing, diaphragmatic breathing and self-pacing activity.   Expected Outcomes Short Term: Participant  will be able to demonstrate and use breathing techniques as needed throughout daily activities.   Increase knowledge of respiratory medications and ability to use respiratory devices properly  Yes   Intervention Provide education and demonstration as needed of appropriate use of medications, inhalers, and oxygen therapy.   Expected Outcomes Short Term: Achieves understanding of medications use. Understands that oxygen is a medication prescribed by physician. Demonstrates appropriate use of inhaler and oxygen therapy.   Diabetes Yes   Intervention Provide education about signs/symptoms and action to take for hypo/hyperglycemia.;Provide education about proper nutrition, including hydration, and aerobic/resistive exercise prescription along with prescribed medications to achieve blood glucose in normal ranges: Fasting glucose 65-99 mg/dL   Expected Outcomes Short Term: Participant verbalizes understanding of the signs/symptoms and immediate care of hyper/hypoglycemia, proper foot care and importance of medication, aerobic/resistive exercise and nutrition plan for blood glucose control.;Long Term: Attainment of HbA1C < 7%.   Heart Failure Yes   Intervention Provide a combined exercise and nutrition program that is supplemented with education, support and counseling about heart failure. Directed toward relieving symptoms such as shortness of breath, decreased exercise tolerance, and extremity edema.   Expected Outcomes Improve functional capacity of life;Short term: Attendance in program 2-3 days a week with increased exercise capacity. Reported lower sodium intake. Reported increased fruit and vegetable intake. Reports medication compliance.;Short term: Daily weights obtained and reported for increase. Utilizing diuretic protocols set by physician.;Long term: Adoption of self-care skills and reduction of barriers for early signs and symptoms recognition and intervention leading to self-care maintenance.    Hypertension Yes   Intervention Provide education on lifestyle modifcations including regular physical activity/exercise, weight management, moderate sodium restriction and increased consumption of fresh fruit, vegetables, and low fat dairy, alcohol moderation, and smoking cessation.;Monitor prescription use compliance.   Expected Outcomes Short Term: Continued assessment and intervention until BP is < 140/41mm HG in hypertensive participants. < 130/65mm HG in hypertensive participants with diabetes, heart failure or chronic kidney disease.;Long Term: Maintenance of blood pressure at goal levels.   Lipids Yes   Intervention Provide education and support for participant on nutrition & aerobic/resistive exercise along with prescribed medications to achieve LDL 70mg ,  HDL >40mg .   Expected Outcomes Short Term: Participant states understanding of desired cholesterol values and is compliant with medications prescribed. Participant is following exercise prescription and nutrition guidelines.;Long Term: Cholesterol controlled with medications as prescribed, with individualized exercise RX and with personalized nutrition plan. Value goals: LDL < 70mg , HDL > 40 mg.      Core Components/Risk Factors/Patient Goals Review:    Core Components/Risk Factors/Patient Goals at Discharge (Final Review):    ITP Comments:   Comments: Initial ITP

## 2016-01-22 ENCOUNTER — Encounter: Payer: Self-pay | Admitting: *Deleted

## 2016-01-22 DIAGNOSIS — I5032 Chronic diastolic (congestive) heart failure: Secondary | ICD-10-CM

## 2016-01-22 DIAGNOSIS — J449 Chronic obstructive pulmonary disease, unspecified: Secondary | ICD-10-CM | POA: Diagnosis not present

## 2016-01-22 DIAGNOSIS — J45909 Unspecified asthma, uncomplicated: Secondary | ICD-10-CM

## 2016-01-22 DIAGNOSIS — I509 Heart failure, unspecified: Secondary | ICD-10-CM

## 2016-01-22 LAB — GLUCOSE, CAPILLARY
GLUCOSE-CAPILLARY: 127 mg/dL — AB (ref 65–99)
GLUCOSE-CAPILLARY: 149 mg/dL — AB (ref 65–99)

## 2016-01-22 NOTE — Progress Notes (Signed)
Daily Session Note  Patient Details  Name: [name] MRN: [id number] Date of Birth: [birth year] Referring Provider:   Flowsheet Row Pulmonary Rehab from 01/15/2016 in [facility]  Referring Provider  Ramashandran MD      Encounter Date: 01/22/2016  Check In:     Session Check In - 01/22/16 1238      Check-In   Location [facility] Pul[facility]   Staff Present Carson Myrtle, BS, RRT, Respiratory Therapist;Kelly Amedeo Plenty, BS, ACSM CEP, Exercise Physiologist;Oryn Casanova Oletta Darter, BA, ACSM CEP, Exercise Physiologist   Supervising physician immediately available to respond to emergencies [facility] immediately available ER MD   Physician(s) Joni Fears and Mariea Clonts   Medication changes reported     No   Fall or balance concerns reported    No   Warm-up and Cool-down Performed as group-led Location manager Performed Yes   VAD Patient? No     Pain Assessment   Currently in Pain? No/denies         Goals Met:  Proper associated with RPD/PD & O2 Sat Independence with exercise equipment Exercise tolerated well Strength training completed today  Goals Unmet:  Not Applicable  Comments: First full day of exercise!  Patient was oriented to gym and equipment including functions, settings, policies, and procedures.  [name] individual exercise prescription and treatment plan were reviewed.  All starting workloads were established based on the results of the 6 minute walk test done at initial orientation visit.  The plan for exercise progression was also introduced and progression will be customized based on patient's performance and goals.    Dr. Emily Filbert is Medical Director for [facility] and [facility].

## 2016-01-22 NOTE — Progress Notes (Signed)
Pulmonary Individual Treatment Plan  Patient Details  Name: [name] MRN: [id number] Date of Birth: [birth year] Referring Provider:   Flowsheet Row Pulmonary Rehab from 01/15/2016 in [facility] Cardiac and Pulmonary Rehab  Referring Provider  Ramashandran MD      Initial Encounter Date:  Flowsheet Row Pulmonary Rehab from 01/15/2016 in [facility] Cardiac and Pulmonary Rehab  Date  01/15/16  Referring Provider  Ramashandran MD      Visit Diagnosis: Asthma, unspecified asthma severity, unspecified whether complicated, unspecified whether persistent  Chronic diastolic congestive heart failure ([place])  Morbid obesity, unspecified obesity type ([place])  [name] Home Medications on Admission:  Current Outpatient Prescriptions:  .  acetaminophen (TYLENOL) 500 MG tablet, Take 1,000 mg by mouth every 6 (six) hours as needed for mild pain, fever or headache., Disp: , Rfl:  .  albuterol (PROVENTIL HFA;VENTOLIN HFA) 108 (90 BASE) MCG/ACT inhaler, Inhale 2 puffs into the lungs every 6 (six) hours as needed for wheezing or shortness of breath., Disp: 1 Inhaler, Rfl: 0 .  amLODipine (NORVASC) 10 MG tablet, Take 1 tablet (10 mg total) by mouth daily., Disp: 30 tablet, Rfl: 0 .  aspirin EC 81 MG tablet, Take 81 mg by mouth at bedtime., Disp: , Rfl:  .  budesonide (PULMICORT) 0.25 MG/2ML nebulizer solution, Take 2 mLs (0.25 mg total) by nebulization 2 (two) times daily., Disp: 60 mL, Rfl: 12 .  citalopram (CELEXA) 40 MG tablet, Take 40 mg by mouth daily., Disp: , Rfl:  .  guaiFENesin-dextromethorphan (ROBITUSSIN DM) 100-10 MG/5ML syrup, Take 5 mLs by mouth every 4 (four) hours as needed for cough., Disp: 118 mL, Rfl: 0 .  hydrALAZINE (APRESOLINE) 100 MG tablet, Take 1 tablet (100 mg total) by mouth every 8 (eight) hours., Disp: 60 tablet, Rfl: 0 .  insulin aspart (NOVOLOG FLEXPEN) 100 UNIT/ML FlexPen, Inject 0-12 Units into the skin 3 (three) times daily with meals as needed for high blood sugar. Pt uses as  needed per sliding scale., Disp: , Rfl:  .  Insulin Degludec (TRESIBA FLEXTOUCH) 100 UNIT/ML SOPN, Inject 45 Units into the skin at bedtime., Disp: 1 pen, Rfl: 2 .  ipratropium-albuterol (DUONEB) 0.5-2.5 (3) MG/3ML SOLN, Take 3 mLs by nebulization every 6 (six) hours as needed (for wheezing/shortness of breath)., Disp: , Rfl:  .  losartan (COZAAR) 50 MG tablet, Take 1 tablet (50 mg total) by mouth daily., Disp: 30 tablet, Rfl: 0 .  metoprolol (LOPRESSOR) 50 MG tablet, Take 50 mg by mouth 2 (two) times daily., Disp: , Rfl:  .  potassium chloride (K-DUR,KLOR-CON) 10 MEQ tablet, Take 1 tablet (10 mEq total) by mouth 2 (two) times daily., Disp: 30 tablet, Rfl: 0 .  simvastatin (ZOCOR) 40 MG tablet, Take 40 mg by mouth at bedtime., Disp: , Rfl:  .  torsemide (DEMADEX) 20 MG tablet, Take 2 tablets (40 mg total) by mouth daily., Disp: 30 tablet, Rfl: 0  Past Medical History: Past Medical History:  Diagnosis Date  . Asthma   . CHF (congestive heart failure) ([place])   . Diabetes mellitus without complication ([place])   . Hypertension   . Iron deficiency anemia   . Pericardial effusion    a. 11/2014 CT Chest: Mod effusion.  [name] Poorly controlled diabetes mellitus ([place])    a. 11/2014 A1c 13.2.    Tobacco Use: History  Smoking Status  . Never Smoker  Smokeless Tobacco  . Never Used    Labs: Recent Review Flowsheet Data    Labs for ITP  Cardiac and Pulmonary Rehab Latest Ref Rng & Units 12/15/2014 01/03/2015 08/09/2015   Cholestrol 0 - 200 mg/dL - 232(H) -   LDLCALC 0 - 99 mg/dL - 169(H) -   HDL >40 mg/dL - 41 -   Trlycerides <150 mg/dL - 112 -   Hemoglobin A1c 4.0 - 6.0 % 13.2(H) - 10.7(H)   HCO3 21.0 - 28.0 mEq/L - - 36.4(H)   O2SAT % - - 80.3       ADL UCSD:     Pulmonary Assessment Scores    Row Name 01/15/16 1615         ADL UCSD   ADL Phase Entry     SOB Score total 118     Rest 3     Walk 5     Stairs 5     Bath 5     Dress 5     Shop 5        Pulmonary Function  Assessment:     Pulmonary Function Assessment - 01/15/16 1614      Pulmonary Function Tests   FVC% 27 %   FEV1% 26 %   FEV1/FVC Ratio 79.61     Initial Spirometry Results   Comments Test date 01/15/16     Breath   Bilateral Breath Sounds Clear;Decreased   Shortness of Breath Yes;Limiting activity;Panic with Shortness of Breath;Fear of Shortness of Breath      Exercise Target Goals:    Exercise Program Goal: Individual exercise prescription set with THRR, safety & activity barriers. Participant demonstrates ability to understand and report RPE using BORG scale, to self-measure pulse accurately, and to acknowledge the importance of the exercise prescription.  Exercise Prescription Goal: Starting with aerobic activity 30 plus minutes a day, 3 days per week for initial exercise prescription. Provide home exercise prescription and guidelines that participant acknowledges understanding prior to discharge.  Activity Barriers & Risk Stratification:     Activity Barriers & Cardiac Risk Stratification - 01/15/16 1613      Activity Barriers & Cardiac Risk Stratification   Activity Barriers Joint Problems;Deconditioning;Muscular Weakness;Shortness of Breath;Assistive Device;Balance Concerns  also has a fear of falling, uses rollator at home and was encouraged to bring it to rehab.   Cardiac Risk Stratification Moderate      6 Minute Walk:     6 Minute Walk    Row Name 01/15/16 1637         6 Minute Walk   Phase Initial     Distance 140 feet     Walk Time 2.33 minutes     # of Rest Breaks 2  1:26, stopped at 2:56      MPH 0.68     METS 1.5     RPE 12     Perceived Dyspnea  5     VO2 Peak 1.99     Symptoms Yes (comment)     Comments hurting pain in legs and back (8/10), anxiety about falling     Resting HR 83 bpm     Resting BP 166/60     Max Ex. HR 90 bpm     Max Ex. BP 152/54     2 Minute Post BP 134/60       Interval HR   Baseline HR 83     1 Minute HR 85      2 Minute HR 84     3 Minute HR 90     4 Minute HR 86  5 Minute HR 84     6 Minute HR 84     2 Minute Post HR 82     Interval Heart Rate? Yes       Interval Oxygen   Interval Oxygen? Yes     Baseline Oxygen Saturation % 96 %  was 88% on her pulsed 3L     Baseline Liters of Oxygen 4 L  switched to e-cylinder continuous flow     1 Minute Oxygen Saturation % 95 %  rest break 30-1:56     1 Minute Liters of Oxygen 4 L     2 Minute Oxygen Saturation % 94 %     2 Minute Liters of Oxygen 4 L     3 Minute Oxygen Saturation % 94 %  pt sat down at 2:56     3 Minute Liters of Oxygen 4 L     4 Minute Oxygen Saturation % 96 %     4 Minute Liters of Oxygen 4 L     5 Minute Oxygen Saturation % 98 %     5 Minute Liters of Oxygen 4 L     6 Minute Oxygen Saturation % 100 %     6 Minute Liters of Oxygen 4 L     2 Minute Post Oxygen Saturation % 100 %     2 Minute Post Liters of Oxygen 4 L        Initial Exercise Prescription:     Initial Exercise Prescription - 01/15/16 1600      Date of Initial Exercise RX and Referring Provider   Date 01/15/16   Referring Provider Ramashandran MD     Oxygen   Oxygen Continuous   Liters 4     Treadmill   MPH 0.5   Grade 0   Minutes 3  1 min intervals   METs 1.5     T5 Nustep   Level 1   Watts --  60-80 spm   Minutes 15   METs 1.5     Biostep-RELP   Level 1   Watts --  40-50 spm   Minutes 15   METs 1     Prescription Details   Frequency (times per week) 3   Duration Progress to 45 minutes of aerobic exercise without signs/symptoms of physical distress     Intensity   THRR 40-80% of Max Heartrate 120-157   Ratings of Perceived Exertion 11-15   Perceived Dyspnea 0-4     Progression   Progression Continue to progress workloads to maintain intensity without signs/symptoms of physical distress.     Resistance Training   Training Prescription Yes   Weight 2 lbs   Reps 10-12      Perform Capillary Blood Glucose checks  as needed.  Exercise Prescription Changes:     Exercise Prescription Changes    Row Name 01/22/16 1300             Exercise Review   Progression No         Response to Exercise   Blood Pressure (Admit) 138/82       Blood Pressure (Exercise) 168/88       Blood Pressure (Exit) 138/80       Heart Rate (Admit) 89 bpm       Heart Rate (Exercise) 95 bpm       Heart Rate (Exit) 86 bpm       Oxygen Saturation (Admit) 96 %  Oxygen Saturation (Exercise) 93 %       Oxygen Saturation (Exit) 100 %       Rating of Perceived Exertion (Exercise) 15       Perceived Dyspnea (Exercise) 3       Duration Progress to 45 minutes of aerobic exercise without signs/symptoms of physical distress       Intensity THRR unchanged         Progression   Progression Continue to progress workloads to maintain intensity without signs/symptoms of physical distress.         Resistance Training   Training Prescription Yes       Weight 2       Reps 10-12         Interval Training   Interval Training No         Oxygen   Oxygen Continuous       Liters 4         Treadmill   MPH 0.5       Grade 0       Minutes 3         Biostep-RELP   Level 1       Minutes 17          Exercise Comments:     Exercise Comments    Row Name 01/15/16 1642           Exercise Comments [name] wants to get her independence back and to be able to get out more.  She has been spending a lot of time in her bed.  I talked with her and her husband about getting up and moving out to the living room to sit in the chair with her feet propped up to help start to increase her strength and stamina.          Discharge Exercise Prescription (Final Exercise Prescription Changes):     Exercise Prescription Changes - 01/22/16 1300      Exercise Review   Progression No     Response to Exercise   Blood Pressure (Admit) 138/82   Blood Pressure (Exercise) 168/88   Blood Pressure (Exit) 138/80   Heart Rate (Admit) 89 bpm    Heart Rate (Exercise) 95 bpm   Heart Rate (Exit) 86 bpm   Oxygen Saturation (Admit) 96 %   Oxygen Saturation (Exercise) 93 %   Oxygen Saturation (Exit) 100 %   Rating of Perceived Exertion (Exercise) 15   Perceived Dyspnea (Exercise) 3   Duration Progress to 45 minutes of aerobic exercise without signs/symptoms of physical distress   Intensity THRR unchanged     Progression   Progression Continue to progress workloads to maintain intensity without signs/symptoms of physical distress.     Resistance Training   Training Prescription Yes   Weight 2   Reps 10-12     Interval Training   Interval Training No     Oxygen   Oxygen Continuous   Liters 4     Treadmill   MPH 0.5   Grade 0   Minutes 3     Biostep-RELP   Level 1   Minutes 17       Nutrition:  Target Goals: Understanding of nutrition guidelines, daily intake of sodium 1500mg , cholesterol 200mg , calories 30% from fat and 7% or less from saturated fats, daily to have 5 or more servings of fruits and vegetables.  Biometrics:    Nutrition Therapy Plan and Nutrition Goals:   Nutrition Discharge: Rate Your Plate  Scores:   Psychosocial: Target Goals: Acknowledge presence or absence of depression, maximize coping skills, provide positive support system. Participant is able to verbalize types and ability to use techniques and skills needed for reducing stress and depression.  Initial Review & Psychosocial Screening:     Initial Psych Review & Screening - 01/15/16 1623      Family Dynamics   Good Support System? Yes   Comments Ms Demuro has good support from her family and knows that she has to make changes to her life for a healthier life for her family and herself.     Barriers   Psychosocial barriers to participate in program The patient should benefit from training in stress management and relaxation.     Screening Interventions   Interventions Encouraged to exercise;Program counselor consult       Quality of Life Scores:     Quality of Life - 01/15/16 1625      Quality of Life Scores   Health/Function Pre 20.75 %   Socioeconomic Pre 20.43 %   Psych/Spiritual Pre 21 %   Family Pre 20.6 %   GLOBAL Pre 20.71 %      PHQ-9: Recent Review Flowsheet Data    Depression screen [facility] 2/9 01/15/2016 10/02/2015 08/30/2015 05/02/2015   Decreased Interest 2 0 0 2   Down, Depressed, Hopeless 1 0 0 1   PHQ - 2 Score 3 0 0 3   Altered sleeping 3 - - 2   Tired, decreased energy 3 - - 3   Change in appetite 2 - - 1   Feeling bad or failure about yourself  2 - - 2   Trouble concentrating 2 - - 2   Moving slowly or fidgety/restless 1 - - 1   Suicidal thoughts 1 - - 1   PHQ-9 Score 17 - - 15   Difficult doing work/chores Very difficult - - Somewhat difficult      Psychosocial Evaluation and Intervention:   Psychosocial Re-Evaluation:  Education: Education Goals: Education classes will be provided on a weekly basis, covering required topics. Participant will state understanding/return demonstration of topics presented.  Learning Barriers/Preferences:     Learning Barriers/Preferences - 01/15/16 1613      Learning Barriers/Preferences   Learning Barriers None   Learning Preferences None      Education Topics: Initial Evaluation Education: - Verbal, written and demonstration of respiratory meds, RPE/PD scales, oximetry and breathing techniques. Instruction on use of nebulizers and MDIs: cleaning and proper use, rinsing mouth with steroid doses and importance of monitoring MDI activations. Flowsheet Row Pulmonary Rehab from 01/15/2016 in [facility] Cardiac and Pulmonary Rehab  Date  01/15/16  Educator  LB  Instruction Review Code  2- meets goals/outcomes      General Nutrition Guidelines/Fats and Fiber: -Group instruction provided by verbal, written material, models and posters to present the general guidelines for heart healthy nutrition. Gives an explanation and review of dietary  fats and fiber.   Controlling Sodium/Reading Food Labels: -Group verbal and written material supporting the discussion of sodium use in heart healthy nutrition. Review and explanation with models, verbal and written materials for utilization of the food label.   Exercise Physiology & Risk Factors: - Group verbal and written instruction with models to review the exercise physiology of the cardiovascular system and associated critical values. Details cardiovascular disease risk factors and the goals associated with each risk factor.   Aerobic Exercise & Resistance Training: - Gives group verbal and written discussion  on the health impact of inactivity. On the components of aerobic and resistive training programs and the benefits of this training and how to safely progress through these programs.   Flexibility, Balance, General Exercise Guidelines: - Provides group verbal and written instruction on the benefits of flexibility and balance training programs. Provides general exercise guidelines with specific guidelines to those with heart or lung disease. Demonstration and skill practice provided.   Stress Management: - Provides group verbal and written instruction about the health risks of elevated stress, cause of high stress, and healthy ways to reduce stress.   Depression: - Provides group verbal and written instruction on the correlation between heart/lung disease and depressed mood, treatment options, and the stigmas associated with seeking treatment.   Exercise & Equipment Safety: - Individual verbal instruction and demonstration of equipment use and safety with use of the equipment.   Infection Prevention: - Provides verbal and written material to individual with discussion of infection control including proper hand washing and proper equipment cleaning during exercise session. Flowsheet Row Pulmonary Rehab from 01/15/2016 in [facility] Cardiac and Pulmonary Rehab  Date  01/15/16   Educator  LB  Instruction Review Code  2- meets goals/outcomes      Falls Prevention: - Provides verbal and written material to individual with discussion of falls prevention and safety. Flowsheet Row Pulmonary Rehab from 01/15/2016 in [facility] Cardiac and Pulmonary Rehab  Date  01/15/16  Educator  LB  Instruction Review Code  2- meets goals/outcomes      Diabetes: - Individual verbal and written instruction to review signs/symptoms of diabetes, desired ranges of glucose level fasting, after meals and with exercise. Advice that pre and post exercise glucose checks will be done for 3 sessions at entry of program.   Chronic Lung Diseases: - Group verbal and written instruction to review new updates, new respiratory medications, new advancements in procedures and treatments. Provide informative websites and "800" numbers of self-education.   Lung Procedures: - Group verbal and written instruction to describe testing methods done to diagnose lung disease. Review the outcome of test results. Describe the treatment choices: Pulmonary Function Tests, ABGs and oximetry.   Energy Conservation: - Provide group verbal and written instruction for methods to conserve energy, plan and organize activities. Instruct on pacing techniques, use of adaptive equipment and posture/positioning to relieve shortness of breath.   Triggers: - Group verbal and written instruction to review types of environmental controls: home humidity, furnaces, filters, dust mite/pet prevention, HEPA vacuums. To discuss weather changes, air quality and the benefits of nasal washing.   Exacerbations: - Group verbal and written instruction to provide: warning signs, infection symptoms, calling MD promptly, preventive modes, and value of vaccinations. Review: effective airway clearance, coughing and/or vibration techniques. Create an Sports administrator.   Oxygen: - Individual and group verbal and written instruction on oxygen  therapy. Includes supplement oxygen, available portable oxygen systems, continuous and intermittent flow rates, oxygen safety, concentrators, and Medicare reimbursement for oxygen. Flowsheet Row Pulmonary Rehab from 01/15/2016 in [facility] Cardiac and Pulmonary Rehab  Date  01/15/16  Educator  LB  Instruction Review Code  2- meets goals/outcomes      Respiratory Medications: - Group verbal and written instruction to review medications for lung disease. Drug class, frequency, complications, importance of spacers, rinsing mouth after steroid MDI's, and proper cleaning methods for nebulizers. Flowsheet Row Pulmonary Rehab from 01/15/2016 in [facility] Cardiac and Pulmonary Rehab  Date  01/15/16  Educator  LB  Instruction Review Code  2- meets goals/outcomes      AED/CPR: - Group verbal and written instruction with the use of models to demonstrate the basic use of the AED with the basic ABC's of resuscitation.   Breathing Retraining: - Provides individuals verbal and written instruction on purpose, frequency, and proper technique of diaphragmatic breathing and pursed-lipped breathing. Applies individual practice skills. Flowsheet Row Pulmonary Rehab from 01/15/2016 in [facility] Cardiac and Pulmonary Rehab  Date  01/15/16  Educator  LB  Instruction Review Code  2- meets goals/outcomes      Anatomy and Physiology of the Lungs: - Group verbal and written instruction with the use of models to provide basic lung anatomy and physiology related to function, structure and complications of lung disease.   Heart Failure: - Group verbal and written instruction on the basics of heart failure: signs/symptoms, treatments, explanation of ejection fraction, enlarged heart and cardiomyopathy.   Sleep Apnea: - Individual verbal and written instruction to review Obstructive Sleep Apnea. Review of risk factors, methods for diagnosing and types of masks and machines for OSA.   Anxiety: - Provides group, verbal and  written instruction on the correlation between heart/lung disease and anxiety, treatment options, and management of anxiety.   Relaxation: - Provides group, verbal and written instruction about the benefits of relaxation for patients with heart/lung disease. Also provides patients with examples of relaxation techniques.   Knowledge Questionnaire Score:     Knowledge Questionnaire Score - 01/15/16 1613      Knowledge Questionnaire Score   Pre Score 10/10       Core Components/Risk Factors/Patient Goals at Admission:     Personal Goals and Risk Factors at Admission - 01/15/16 1619      Core Components/Risk Factors/Patient Goals on Admission    Weight Management Yes;Obesity;Weight Loss   Intervention Weight Management: Develop a combined nutrition and exercise program designed to reach desired caloric intake, while maintaining appropriate intake of nutrient and fiber, sodium and fats, and appropriate energy expenditure required for the weight goal.;Weight Management: Provide education and appropriate resources to help participant work on and attain dietary goals.;Weight Management/Obesity: Establish reasonable short term and long term weight goals.;Obesity: Provide education and appropriate resources to help participant work on and attain dietary goals.   Admit Weight 358 lb (162.4 kg)   Goal Weight: Short Term 355 lb (161 kg)   Goal Weight: Long Term 300 lb (136.1 kg)   Expected Outcomes Long Term: Adherence to nutrition and physical activity/exercise program aimed toward attainment of established weight goal;Weight Maintenance: Understanding of the daily nutrition guidelines, which includes 25-35% calories from fat, 7% or less cal from saturated fats, less than 200mg  cholesterol, less than 1.5gm of sodium, & 5 or more servings of fruits and vegetables daily;Weight Loss: Understanding of general recommendations for a balanced deficit meal plan, which promotes 1-2 lb weight loss per week and  includes a negative energy balance of [phone] kcal/d;Understanding recommendations for meals to include 15-35% energy as protein, 25-35% energy from fat, 35-60% energy from carbohydrates, less than 200mg  of dietary cholesterol, 20-35 gm of total fiber daily;Understanding of distribution of calorie intake throughout the day with the consumption of 4-5 meals/snacks   Sedentary Yes   Intervention Provide advice, education, support and counseling about physical activity/exercise needs.;Develop an individualized exercise prescription for aerobic and resistive training based on initial evaluation findings, risk stratification, comorbidities and participant's personal goals.   Expected Outcomes Achievement of increased cardiorespiratory fitness and enhanced flexibility, muscular endurance and strength shown through  measurements of functional capacity and personal statement of participant.   Increase Strength and Stamina Yes   Intervention Provide advice, education, support and counseling about physical activity/exercise needs.;Develop an individualized exercise prescription for aerobic and resistive training based on initial evaluation findings, risk stratification, comorbidities and participant's personal goals.   Expected Outcomes Achievement of increased cardiorespiratory fitness and enhanced flexibility, muscular endurance and strength shown through measurements of functional capacity and personal statement of participant.   Improve shortness of breath with ADL's Yes   Intervention Provide education, individualized exercise plan and daily activity instruction to help decrease symptoms of SOB with activities of daily living.   Expected Outcomes Short Term: Achieves a reduction of symptoms when performing activities of daily living.   Develop more efficient breathing techniques such as purse lipped breathing and diaphragmatic breathing; and practicing self-pacing with activity Yes   Intervention Provide  education, demonstration and support about specific breathing techniuqes utilized for more efficient breathing. Include techniques such as pursed lipped breathing, diaphragmatic breathing and self-pacing activity.   Expected Outcomes Short Term: Participant will be able to demonstrate and use breathing techniques as needed throughout daily activities.   Increase knowledge of respiratory medications and ability to use respiratory devices properly  Yes   Intervention Provide education and demonstration as needed of appropriate use of medications, inhalers, and oxygen therapy.   Expected Outcomes Short Term: Achieves understanding of medications use. Understands that oxygen is a medication prescribed by physician. Demonstrates appropriate use of inhaler and oxygen therapy.   Diabetes Yes   Intervention Provide education about signs/symptoms and action to take for hypo/hyperglycemia.;Provide education about proper nutrition, including hydration, and aerobic/resistive exercise prescription along with prescribed medications to achieve blood glucose in normal ranges: Fasting glucose 65-99 mg/dL   Expected Outcomes Short Term: Participant verbalizes understanding of the signs/symptoms and immediate care of hyper/hypoglycemia, proper foot care and importance of medication, aerobic/resistive exercise and nutrition plan for blood glucose control.;Long Term: Attainment of HbA1C < 7%.   Heart Failure Yes   Intervention Provide a combined exercise and nutrition program that is supplemented with education, support and counseling about heart failure. Directed toward relieving symptoms such as shortness of breath, decreased exercise tolerance, and extremity edema.   Expected Outcomes Improve functional capacity of life;Short term: Attendance in program 2-3 days a week with increased exercise capacity. Reported lower sodium intake. Reported increased fruit and vegetable intake. Reports medication compliance.;Short term:  Daily weights obtained and reported for increase. Utilizing diuretic protocols set by physician.;Long term: Adoption of self-care skills and reduction of barriers for early signs and symptoms recognition and intervention leading to self-care maintenance.   Hypertension Yes   Intervention Provide education on lifestyle modifcations including regular physical activity/exercise, weight management, moderate sodium restriction and increased consumption of fresh fruit, vegetables, and low fat dairy, alcohol moderation, and smoking cessation.;Monitor prescription use compliance.   Expected Outcomes Short Term: Continued assessment and intervention until BP is < 140/58mm HG in hypertensive participants. < 130/72mm HG in hypertensive participants with diabetes, heart failure or chronic kidney disease.;Long Term: Maintenance of blood pressure at goal levels.   Lipids Yes   Intervention Provide education and support for participant on nutrition & aerobic/resistive exercise along with prescribed medications to achieve LDL 70mg , HDL >40mg .   Expected Outcomes Short Term: Participant states understanding of desired cholesterol values and is compliant with medications prescribed. Participant is following exercise prescription and nutrition guidelines.;Long Term: Cholesterol controlled with medications as prescribed, with individualized exercise RX and  with personalized nutrition plan. Value goals: LDL < 70mg , HDL > 40 mg.      Core Components/Risk Factors/Patient Goals Review:    Core Components/Risk Factors/Patient Goals at Discharge (Final Review):    ITP Comments:   Comments: 30 Day Review and first day of exercise class

## 2016-01-24 ENCOUNTER — Encounter: Payer: Medicaid Other | Admitting: *Deleted

## 2016-01-24 DIAGNOSIS — J45909 Unspecified asthma, uncomplicated: Secondary | ICD-10-CM

## 2016-01-24 DIAGNOSIS — I5032 Chronic diastolic (congestive) heart failure: Secondary | ICD-10-CM

## 2016-01-24 DIAGNOSIS — J449 Chronic obstructive pulmonary disease, unspecified: Secondary | ICD-10-CM | POA: Diagnosis not present

## 2016-01-24 DIAGNOSIS — I509 Heart failure, unspecified: Secondary | ICD-10-CM

## 2016-01-24 LAB — GLUCOSE, CAPILLARY: Glucose-Capillary: 290 mg/dL — ABNORMAL HIGH (ref 65–99)

## 2016-01-24 NOTE — Progress Notes (Signed)
Daily Session Note  Patient Details  Name: [name] MRN: [id number] Date of Birth: [birth year] Referring Provider:   Flowsheet Row Pulmonary Rehab from 01/15/2016 in [facility] Cardiac and Pulmonary Rehab  Referring Provider  Ramashandran MD      Encounter Date: 01/24/2016  Check In:     Session Check In - 01/24/16 1339      Check-In   Location [facility]-Cardiac & Pulmonary Rehab   Staff Present Carson Myrtle, BS, RRT, Respiratory Lennie Hummer, MA, ACSM RCEP, Exercise Physiologist   Supervising physician immediately available to respond to emergencies [facility] immediately available ER MD   Physician(s) Drs. Jimmye Norman and Lord   Medication changes reported     No   Fall or balance concerns reported    No   Warm-up and Cool-down Performed as group-led Location manager Performed Yes   VAD Patient? No     Pain Assessment   Currently in Pain? No/denies   Multiple Pain Sites No         Goals Met:  Proper associated with RPD/PD & O2 Sat Exercise tolerated well No report of cardiac concerns or symptoms Strength training completed today  Goals Unmet:  Not Applicable  Comments: First full day of exercise!  Patient was oriented to gym and equipment including functions, settings, policies, and procedures.  [name] individual exercise prescription and treatment plan were reviewed.  All starting workloads were established based on the results of the 6 minute walk test done at initial orientation visit.  The plan for exercise progression was also introduced and progression will be customized based on patient's performance and goals.    Dr. Emily Filbert is Medical Director for [place] and [facility].

## 2016-01-26 ENCOUNTER — Encounter: Payer: Medicaid Other | Attending: Internal Medicine

## 2016-01-26 DIAGNOSIS — J449 Chronic obstructive pulmonary disease, unspecified: Secondary | ICD-10-CM | POA: Insufficient documentation

## 2016-01-26 DIAGNOSIS — I509 Heart failure, unspecified: Secondary | ICD-10-CM | POA: Insufficient documentation

## 2016-01-29 DIAGNOSIS — I5032 Chronic diastolic (congestive) heart failure: Secondary | ICD-10-CM

## 2016-01-29 DIAGNOSIS — I509 Heart failure, unspecified: Secondary | ICD-10-CM

## 2016-01-29 DIAGNOSIS — J449 Chronic obstructive pulmonary disease, unspecified: Secondary | ICD-10-CM | POA: Diagnosis present

## 2016-01-29 DIAGNOSIS — J45909 Unspecified asthma, uncomplicated: Secondary | ICD-10-CM

## 2016-01-29 NOTE — Progress Notes (Signed)
Daily Session Note  Patient Details  Name: [name] MRN: [id number] Date of Birth: 1971/02/19 Referring Provider:   Flowsheet Row Pulmonary Rehab from 01/15/2016 in [facility] Cardiac and Pulmonary Rehab  Referring Provider  Ramashandran MD      Encounter Date: 01/29/2016  Check In:     Session Check In - 01/29/16 1520      Check-In   Location [facility]-Cardiac & Pulmonary Rehab   Staff Present Carson Myrtle, BS, RRT, Respiratory Therapist;Kelly Amedeo Plenty, BS, ACSM CEP, Exercise Physiologist;Ezri Fanguy Oletta Darter, BA, ACSM CEP, Exercise Physiologist   Supervising physician immediately available to respond to emergencies [facility] immediately available ER MD   Physician(s) Dimas Alexandria and Mariea Clonts   Medication changes reported     No   Fall or balance concerns reported    No   Warm-up and Cool-down Performed as group-led Location manager Performed Yes   VAD Patient? No     Pain Assessment   Currently in Pain? No/denies         Goals Met:  Proper associated with RPD/PD & O2 Sat Independence with exercise equipment Exercise tolerated well Strength training completed today  Goals Unmet:  Not Applicable  Comments: Pt able to follow exercise prescription today without complaint.  Will continue to monitor for progression.    Dr. Emily Filbert is Medical Director for [place] and [facility].

## 2016-02-07 ENCOUNTER — Encounter: Payer: Self-pay | Admitting: Respiratory Therapy

## 2016-02-07 DIAGNOSIS — J45909 Unspecified asthma, uncomplicated: Secondary | ICD-10-CM

## 2016-02-07 DIAGNOSIS — I5032 Chronic diastolic (congestive) heart failure: Secondary | ICD-10-CM

## 2016-02-12 ENCOUNTER — Encounter: Payer: Self-pay | Admitting: Respiratory Therapy

## 2016-02-12 ENCOUNTER — Telehealth: Payer: Self-pay | Admitting: Respiratory Therapy

## 2016-02-12 DIAGNOSIS — J45909 Unspecified asthma, uncomplicated: Secondary | ICD-10-CM

## 2016-02-12 DIAGNOSIS — I5032 Chronic diastolic (congestive) heart failure: Secondary | ICD-10-CM

## 2016-02-12 DIAGNOSIS — I509 Heart failure, unspecified: Secondary | ICD-10-CM

## 2016-02-12 NOTE — Progress Notes (Signed)
Pulmonary Individual Treatment Plan  Patient Details  Name: [name] MRN: [id number] Date of Birth: [birth year] Referring Provider:   Flowsheet Row Pulmonary Rehab from 01/15/2016 in [facility] Cardiac and Pulmonary Rehab  Referring Provider  Ramashandran MD      Initial Encounter Date:  Flowsheet Row Pulmonary Rehab from 01/15/2016 in [facility] Cardiac and Pulmonary Rehab  Date  01/15/16  Referring Provider  Ramashandran MD      Visit Diagnosis: Asthma, unspecified asthma severity, unspecified whether complicated, unspecified whether persistent  Chronic diastolic congestive heart failure ([place])  Morbid obesity, unspecified obesity type ([place])  Congestive heart failure, unspecified congestive heart failure chronicity, unspecified congestive heart failure type ([place])  [name] Home Medications on Admission:  Current Outpatient Prescriptions:    acetaminophen (TYLENOL) 500 MG tablet, Take 1,000 mg by mouth every 6 (six) hours as needed for mild pain, fever or headache., Disp: , Rfl:    albuterol (PROVENTIL HFA;VENTOLIN HFA) 108 (90 BASE) MCG/ACT inhaler, Inhale 2 puffs into the lungs every 6 (six) hours as needed for wheezing or shortness of breath., Disp: 1 Inhaler, Rfl: 0   amLODipine (NORVASC) 10 MG tablet, Take 1 tablet (10 mg total) by mouth daily., Disp: 30 tablet, Rfl: 0   aspirin EC 81 MG tablet, Take 81 mg by mouth at bedtime., Disp: , Rfl:    budesonide (PULMICORT) 0.25 MG/2ML nebulizer solution, Take 2 mLs (0.25 mg total) by nebulization 2 (two) times daily., Disp: 60 mL, Rfl: 12   citalopram (CELEXA) 40 MG tablet, Take 40 mg by mouth daily., Disp: , Rfl:    guaiFENesin-dextromethorphan (ROBITUSSIN DM) 100-10 MG/5ML syrup, Take 5 mLs by mouth every 4 (four) hours as needed for cough., Disp: 118 mL, Rfl: 0   hydrALAZINE (APRESOLINE) 100 MG tablet, Take 1 tablet (100 mg total) by mouth every 8 (eight) hours., Disp: 60 tablet, Rfl: 0   insulin aspart (NOVOLOG FLEXPEN) 100  UNIT/ML FlexPen, Inject 0-12 Units into the skin 3 (three) times daily with meals as needed for high blood sugar. Pt uses as needed per sliding scale., Disp: , Rfl:    Insulin Degludec (TRESIBA FLEXTOUCH) 100 UNIT/ML SOPN, Inject 45 Units into the skin at bedtime., Disp: 1 pen, Rfl: 2   ipratropium-albuterol (DUONEB) 0.5-2.5 (3) MG/3ML SOLN, Take 3 mLs by nebulization every 6 (six) hours as needed (for wheezing/shortness of breath)., Disp: , Rfl:    losartan (COZAAR) 50 MG tablet, Take 1 tablet (50 mg total) by mouth daily., Disp: 30 tablet, Rfl: 0   metoprolol (LOPRESSOR) 50 MG tablet, Take 50 mg by mouth 2 (two) times daily., Disp: , Rfl:    potassium chloride (K-DUR,KLOR-CON) 10 MEQ tablet, Take 1 tablet (10 mEq total) by mouth 2 (two) times daily., Disp: 30 tablet, Rfl: 0   simvastatin (ZOCOR) 40 MG tablet, Take 40 mg by mouth at bedtime., Disp: , Rfl:    torsemide (DEMADEX) 20 MG tablet, Take 2 tablets (40 mg total) by mouth daily., Disp: 30 tablet, Rfl: 0  Past Medical History: Past Medical History:  Diagnosis Date   Asthma    CHF (congestive heart failure) (HCC)    Diabetes mellitus without complication (HCC)    Hypertension    Iron deficiency anemia    Pericardial effusion    a. 11/2014 CT Chest: Mod effusion.   Poorly controlled diabetes mellitus ([place])    a. 11/2014 A1c 13.2.    Tobacco Use: History  Smoking Status   Never Smoker  Smokeless Tobacco   Never  Used    Labs: Recent [employer] for ITP Cardiac and Pulmonary Rehab Latest Ref Rng & Units 12/15/2014 01/03/2015 08/09/2015   Cholestrol 0 - 200 mg/dL - 232(H) -   LDLCALC 0 - 99 mg/dL - 169(H) -   HDL >40 mg/dL - 41 -   Trlycerides <150 mg/dL - 112 -   Hemoglobin A1c 4.0 - 6.0 % 13.2(H) - 10.7(H)   HCO3 21.0 - 28.0 mEq/L - - 36.4(H)   O2SAT % - - 80.3       ADL UCSD:     Pulmonary Assessment Scores    Row Name 01/15/16 1615         ADL UCSD   ADL Phase Entry     SOB  Score total 118     Rest 3     Walk 5     Stairs 5     Bath 5     Dress 5     Shop 5        Pulmonary Function Assessment:     Pulmonary Function Assessment - 01/15/16 1614      Pulmonary Function Tests   FVC% 27 %   FEV1% 26 %   FEV1/FVC Ratio 79.61     Initial Spirometry Results   Comments Test date 01/15/16     Breath   Bilateral Breath Sounds Clear;Decreased   Shortness of Breath Yes;Limiting activity;Panic with Shortness of Breath;Fear of Shortness of Breath      Exercise Target Goals:    Exercise Program Goal: Individual exercise prescription set with THRR, safety & activity barriers. Participant demonstrates ability to understand and report RPE using BORG scale, to self-measure pulse accurately, and to acknowledge the importance of the exercise prescription.  Exercise Prescription Goal: Starting with aerobic activity 30 plus minutes a day, 3 days per week for initial exercise prescription. Provide home exercise prescription and guidelines that participant acknowledges understanding prior to discharge.  Activity Barriers & Risk Stratification:     Activity Barriers & Cardiac Risk Stratification - 01/15/16 1613      Activity Barriers & Cardiac Risk Stratification   Activity Barriers Joint Problems;Deconditioning;Muscular Weakness;Shortness of Breath;Assistive Device;Balance Concerns  also has a fear of falling, uses rollator at home and was encouraged to bring it to rehab.   Cardiac Risk Stratification Moderate      6 Minute Walk:     6 Minute Walk    Row Name 01/15/16 1637         6 Minute Walk   Phase Initial     Distance 140 feet     Walk Time 2.33 minutes     # of Rest Breaks 2  1:26, stopped at 2:56      MPH 0.68     METS 1.5     RPE 12     Perceived Dyspnea  5     VO2 Peak 1.99     Symptoms Yes (comment)     Comments hurting pain in legs and back (8/10), anxiety about falling     Resting HR 83 bpm     Resting BP 166/60     Max Ex. HR  90 bpm     Max Ex. BP 152/54     2 Minute Post BP 134/60       Interval HR   Baseline HR 83     1 Minute HR 85     2 Minute HR 84  3 Minute HR 90     4 Minute HR 86     5 Minute HR 84     6 Minute HR 84     2 Minute Post HR 82     Interval Heart Rate? Yes       Interval Oxygen   Interval Oxygen? Yes     Baseline Oxygen Saturation % 96 %  was 88% on her pulsed 3L     Baseline Liters of Oxygen 4 L  switched to e-cylinder continuous flow     1 Minute Oxygen Saturation % 95 %  rest break 30-1:56     1 Minute Liters of Oxygen 4 L     2 Minute Oxygen Saturation % 94 %     2 Minute Liters of Oxygen 4 L     3 Minute Oxygen Saturation % 94 %  pt sat down at 2:56     3 Minute Liters of Oxygen 4 L     4 Minute Oxygen Saturation % 96 %     4 Minute Liters of Oxygen 4 L     5 Minute Oxygen Saturation % 98 %     5 Minute Liters of Oxygen 4 L     6 Minute Oxygen Saturation % 100 %     6 Minute Liters of Oxygen 4 L     2 Minute Post Oxygen Saturation % 100 %     2 Minute Post Liters of Oxygen 4 L        Initial Exercise Prescription:     Initial Exercise Prescription - 01/15/16 1600      Date of Initial Exercise RX and Referring Provider   Date 01/15/16   Referring Provider Ramashandran MD     Oxygen   Oxygen Continuous   Liters 4     Treadmill   MPH 0.5   Grade 0   Minutes 3  1 min intervals   METs 1.5     T5 Nustep   Level 1   Watts --  60-80 spm   Minutes 15   METs 1.5     Biostep-RELP   Level 1   Watts --  40-50 spm   Minutes 15   METs 1     Prescription Details   Frequency (times per week) 3   Duration Progress to 45 minutes of aerobic exercise without signs/symptoms of physical distress     Intensity   THRR 40-80% of Max Heartrate 120-157   Ratings of Perceived Exertion 11-15   Perceived Dyspnea 0-4     Progression   Progression Continue to progress workloads to maintain intensity without signs/symptoms of physical distress.      Resistance Training   Training Prescription Yes   Weight 2 lbs   Reps 10-12      Perform Capillary Blood Glucose checks as needed.  Exercise Prescription Changes:     Exercise Prescription Changes    Row Name 01/22/16 1300 01/30/16 1400           Exercise Review   Progression No Yes        Response to Exercise   Blood Pressure (Admit) 138/82 184/70      Blood Pressure (Exercise) 168/88 166/82      Blood Pressure (Exit) 138/80 148/68      Heart Rate (Admit) 89 bpm 81 bpm      Heart Rate (Exercise) 95 bpm 83 bpm      Heart Rate (Exit) 86  bpm 82 bpm      Oxygen Saturation (Admit) 96 % 90 %      Oxygen Saturation (Exercise) 93 % 95 %      Oxygen Saturation (Exit) 100 % 99 %      Rating of Perceived Exertion (Exercise) 15 11      Perceived Dyspnea (Exercise) 3 3      Symptoms  -- none      Duration Progress to 45 minutes of aerobic exercise without signs/symptoms of physical distress Progress to 45 minutes of aerobic exercise without signs/symptoms of physical distress      Intensity THRR unchanged THRR unchanged        Progression   Progression Continue to progress workloads to maintain intensity without signs/symptoms of physical distress. Continue to progress workloads to maintain intensity without signs/symptoms of physical distress.      Average METs  -- 1.45        Resistance Training   Training Prescription Yes Yes      Weight 2 2      Reps 10-12 10-12        Interval Training   Interval Training No No        Oxygen   Oxygen Continuous Continuous      Liters 4 4        Treadmill   MPH 0.5 0.5      Grade 0 0      Minutes 3 3      METs  -- 1.4        T5 Nustep   Level  -- 1      Minutes  -- 15      METs  -- 1.5        Biostep-RELP   Level 1 1      Minutes 17 15      METs  -- 1         Exercise Comments:     Exercise Comments    Row Name 01/15/16 1642 01/30/16 1421         Exercise Comments Tajai wants to get her independence back and to  be able to get out more.  She has been spending a lot of time in her bed.  I talked with her and her husband about getting up and moving out to the living room to sit in the chair with her feet propped up to help start to increase her strength and stamina. Lorrin's first day of exercise was 11/27.  She did great.  She has done well with her first three visits.  She has gotten out of the bed and into the chair in the living room.  We will continue to monitor her progression.         Discharge Exercise Prescription (Final Exercise Prescription Changes):     Exercise Prescription Changes - 01/30/16 1400      Exercise Review   Progression Yes     Response to Exercise   Blood Pressure (Admit) 184/70   Blood Pressure (Exercise) 166/82   Blood Pressure (Exit) 148/68   Heart Rate (Admit) 81 bpm   Heart Rate (Exercise) 83 bpm   Heart Rate (Exit) 82 bpm   Oxygen Saturation (Admit) 90 %   Oxygen Saturation (Exercise) 95 %   Oxygen Saturation (Exit) 99 %   Rating of Perceived Exertion (Exercise) 11   Perceived Dyspnea (Exercise) 3   Symptoms none   Duration Progress to 45 minutes of aerobic exercise without  signs/symptoms of physical distress   Intensity THRR unchanged     Progression   Progression Continue to progress workloads to maintain intensity without signs/symptoms of physical distress.   Average METs 1.45     Resistance Training   Training Prescription Yes   Weight 2   Reps 10-12     Interval Training   Interval Training No     Oxygen   Oxygen Continuous   Liters 4     Treadmill   MPH 0.5   Grade 0   Minutes 3   METs 1.4     T5 Nustep   Level 1   Minutes 15   METs 1.5     Biostep-RELP   Level 1   Minutes 15   METs 1       Nutrition:  Target Goals: Understanding of nutrition guidelines, daily intake of sodium <1574m, cholesterol <2090m calories 30% from fat and 7% or less from saturated fats, daily to have 5 or more servings of fruits and  vegetables.  Biometrics:    Nutrition Therapy Plan and Nutrition Goals:   Nutrition Discharge: Rate Your Plate Scores:   Psychosocial: Target Goals: Acknowledge presence or absence of depression, maximize coping skills, provide positive support system. Participant is able to verbalize types and ability to use techniques and skills needed for reducing stress and depression.  Initial Review & Psychosocial Screening:     Initial Psych Review & Screening - 01/15/16 1623      Family Dynamics   Good Support System? Yes   Comments Ms ShCommissoas good support from her family and knows that she has to make changes to her life for a healthier life for her family and herself.     Barriers   Psychosocial barriers to participate in program The patient should benefit from training in stress management and relaxation.     Screening Interventions   Interventions Encouraged to exercise;Program counselor consult      Quality of Life Scores:     Quality of Life - 01/15/16 1625      Quality of Life Scores   Health/Function Pre 20.75 %   Socioeconomic Pre 20.43 %   Psych/Spiritual Pre 21 %   Family Pre 20.6 %   GLOBAL Pre 20.71 %      PHQ-9: Recent Review Flowsheet Data    Depression screen [facility]9 01/15/2016 10/02/2015 08/30/2015 05/02/2015   Decreased Interest 2 0 0 2   Down, Depressed, Hopeless 1 0 0 1   PHQ - 2 Score 3 0 0 3   Altered sleeping 3 - - 2   Tired, decreased energy 3 - - 3   Change in appetite 2 - - 1   Feeling bad or failure about yourself  2 - - 2   Trouble concentrating 2 - - 2   Moving slowly or fidgety/restless 1 - - 1   Suicidal thoughts 1 - - 1   PHQ-9 Score 17 - - 15   Difficult doing work/chores Very difficult - - Somewhat difficult      Psychosocial Evaluation and Intervention:     Psychosocial Evaluation - 01/24/16 1223      Psychosocial Evaluation & Interventions   Interventions Stress management education;Relaxation education;Encouraged to  exercise with the program and follow exercise prescription   Comments Counselor met with Ms. ShJefferson FuelMs. S) today for initial psychosocial evaluation.  She is a [age]ear old who has multiple health issues including COPD; Type 2 Diabetes; CHF  and Asthma.  She has a strong support system with a spouse of 15 years and (4) adult children - (2) of which live either in the home or close by.  Ms. Chauncey Cruel reports sleeping better since she went on Oxygen 6 months ago.  Her appetite is up and down - but mostly good.    She reports a history of anxiety and has been on a variety of medications for this and is currently on Celexa 40 mg.  Counselor discussed with Ms. S her PHQ-9 score of "17" and of this indicating moderately severe depression.  She denies SI/HI but does admit to a lot of stress in her life currently - particularly since she is unable to work and her spouse is on SSI so finances are extremely limited.  She is concerned about having the means to put gas in her car for these classes 3x/week.  Ms. Chauncey Cruel has goals to "get back up and moving around" since she admits to being bed-ridden for the past year or so.  counselor encouraged her to come consistently - if possible for the best results.  And to speak with the nurses about transportation options that might be available.  Counselor also mentioned if mood does not improve with consistent exercise in the next few weeks, Ms. Chauncey Cruel may need to speak with her Dr. about increasing her medications for depression.  Counselor and staff will continue to follow with her throughout the course of this program.     Continued Psychosocial Services Needed Yes  Ms. Chauncey Cruel may need her Rx for depression increased if exercise does not help over the next few weeks.  She agreed.  Also, she may have barriers to attend with limited $$ for gasoline.        Psychosocial Re-Evaluation:  Education: Education Goals: Education classes will be provided on a weekly basis, covering required topics.  Participant will state understanding/return demonstration of topics presented.  Learning Barriers/Preferences:     Learning Barriers/Preferences - 01/15/16 1613      Learning Barriers/Preferences   Learning Barriers None   Learning Preferences None      Education Topics: Initial Evaluation Education: - Verbal, written and demonstration of respiratory meds, RPE/PD scales, oximetry and breathing techniques. Instruction on use of nebulizers and MDIs: cleaning and proper use, rinsing mouth with steroid doses and importance of monitoring MDI activations. Flowsheet Row Pulmonary Rehab from 01/29/2016 in [facility] Cardiac and Pulmonary Rehab  Date  01/15/16  Educator  LB  Instruction Review Code  2- meets goals/outcomes      General Nutrition Guidelines/Fats and Fiber: -Group instruction provided by verbal, written material, models and posters to present the general guidelines for heart healthy nutrition. Gives an explanation and review of dietary fats and fiber.   Controlling Sodium/Reading Food Labels: -Group verbal and written material supporting the discussion of sodium use in heart healthy nutrition. Review and explanation with models, verbal and written materials for utilization of the food label.   Exercise Physiology & Risk Factors: - Group verbal and written instruction with models to review the exercise physiology of the cardiovascular system and associated critical values. Details cardiovascular disease risk factors and the goals associated with each risk factor.   Aerobic Exercise & Resistance Training: - Gives group verbal and written discussion on the health impact of inactivity. On the components of aerobic and resistive training programs and the benefits of this training and how to safely progress through these programs.   Flexibility, Balance, General Exercise  Guidelines: - Provides group verbal and written instruction on the benefits of flexibility and balance training  programs. Provides general exercise guidelines with specific guidelines to those with heart or lung disease. Demonstration and skill practice provided.   Stress Management: - Provides group verbal and written instruction about the health risks of elevated stress, cause of high stress, and healthy ways to reduce stress.   Depression: - Provides group verbal and written instruction on the correlation between heart/lung disease and depressed mood, treatment options, and the stigmas associated with seeking treatment. Flowsheet Row Pulmonary Rehab from 01/29/2016 in [facility] Cardiac and Pulmonary Rehab  Date  01/29/16  Educator  [facility]  Instruction Review Code  2- meets goals/outcomes      Exercise & Equipment Safety: - Individual verbal instruction and demonstration of equipment use and safety with use of the equipment.   Infection Prevention: - Provides verbal and written material to individual with discussion of infection control including proper hand washing and proper equipment cleaning during exercise session. Flowsheet Row Pulmonary Rehab from 01/29/2016 in [facility] Cardiac and Pulmonary Rehab  Date  01/15/16  Educator  LB  Instruction Review Code  2- meets goals/outcomes      Falls Prevention: - Provides verbal and written material to individual with discussion of falls prevention and safety. Flowsheet Row Pulmonary Rehab from 01/29/2016 in [facility] Cardiac and Pulmonary Rehab  Date  01/15/16  Educator  LB  Instruction Review Code  2- meets goals/outcomes      Diabetes: - Individual verbal and written instruction to review signs/symptoms of diabetes, desired ranges of glucose level fasting, after meals and with exercise. Advice that pre and post exercise glucose checks will be done for 3 sessions at entry of program.   Chronic Lung Diseases: - Group verbal and written instruction to review new updates, new respiratory medications, new advancements in procedures and treatments. Provide  informative websites and "800" numbers of self-education. Flowsheet Row Pulmonary Rehab from 01/29/2016 in [facility] Cardiac and Pulmonary Rehab  Date  01/24/16  Educator  LB  Instruction Review Code  2- meets goals/outcomes      Lung Procedures: - Group verbal and written instruction to describe testing methods done to diagnose lung disease. Review the outcome of test results. Describe the treatment choices: Pulmonary Function Tests, ABGs and oximetry.   Energy Conservation: - Provide group verbal and written instruction for methods to conserve energy, plan and organize activities. Instruct on pacing techniques, use of adaptive equipment and posture/positioning to relieve shortness of breath.   Triggers: - Group verbal and written instruction to review types of environmental controls: home humidity, furnaces, filters, dust mite/pet prevention, HEPA vacuums. To discuss weather changes, air quality and the benefits of nasal washing.   Exacerbations: - Group verbal and written instruction to provide: warning signs, infection symptoms, calling MD promptly, preventive modes, and value of vaccinations. Review: effective airway clearance, coughing and/or vibration techniques. Create an Sports administrator.   Oxygen: - Individual and group verbal and written instruction on oxygen therapy. Includes supplement oxygen, available portable oxygen systems, continuous and intermittent flow rates, oxygen safety, concentrators, and Medicare reimbursement for oxygen. Flowsheet Row Pulmonary Rehab from 01/29/2016 in [facility] Cardiac and Pulmonary Rehab  Date  01/15/16  Educator  LB  Instruction Review Code  2- meets goals/outcomes      Respiratory Medications: - Group verbal and written instruction to review medications for lung disease. Drug class, frequency, complications, importance of spacers, rinsing mouth after steroid MDI's, and proper cleaning  methods for nebulizers. Flowsheet Row Pulmonary Rehab from  01/29/2016 in [facility] Cardiac and Pulmonary Rehab  Date  01/15/16  Educator  LB  Instruction Review Code  2- meets goals/outcomes      AED/CPR: - Group verbal and written instruction with the use of models to demonstrate the basic use of the AED with the basic ABC's of resuscitation.   Breathing Retraining: - Provides individuals verbal and written instruction on purpose, frequency, and proper technique of diaphragmatic breathing and pursed-lipped breathing. Applies individual practice skills. Flowsheet Row Pulmonary Rehab from 01/29/2016 in [facility] Cardiac and Pulmonary Rehab  Date  01/15/16  Educator  LB  Instruction Review Code  2- meets goals/outcomes      Anatomy and Physiology of the Lungs: - Group verbal and written instruction with the use of models to provide basic lung anatomy and physiology related to function, structure and complications of lung disease.   Heart Failure: - Group verbal and written instruction on the basics of heart failure: signs/symptoms, treatments, explanation of ejection fraction, enlarged heart and cardiomyopathy.   Sleep Apnea: - Individual verbal and written instruction to review Obstructive Sleep Apnea. Review of risk factors, methods for diagnosing and types of masks and machines for OSA.   Anxiety: - Provides group, verbal and written instruction on the correlation between heart/lung disease and anxiety, treatment options, and management of anxiety.   Relaxation: - Provides group, verbal and written instruction about the benefits of relaxation for patients with heart/lung disease. Also provides patients with examples of relaxation techniques.   Knowledge Questionnaire Score:     Knowledge Questionnaire Score - 01/15/16 1613      Knowledge Questionnaire Score   Pre Score 10/10       Core Components/Risk Factors/Patient Goals at Admission:     Personal Goals and Risk Factors at Admission - 01/15/16 1619      Core Components/Risk  Factors/Patient Goals on Admission    Weight Management Yes;Obesity;Weight Loss   Intervention Weight Management: Develop a combined nutrition and exercise program designed to reach desired caloric intake, while maintaining appropriate intake of nutrient and fiber, sodium and fats, and appropriate energy expenditure required for the weight goal.;Weight Management: Provide education and appropriate resources to help participant work on and attain dietary goals.;Weight Management/Obesity: Establish reasonable short term and long term weight goals.;Obesity: Provide education and appropriate resources to help participant work on and attain dietary goals.   Admit Weight 358 lb (162.4 kg)   Goal Weight: Short Term 355 lb (161 kg)   Goal Weight: Long Term 300 lb (136.1 kg)   Expected Outcomes Long Term: Adherence to nutrition and physical activity/exercise program aimed toward attainment of established weight goal;Weight Maintenance: Understanding of the daily nutrition guidelines, which includes 25-35% calories from fat, 7% or less cal from saturated fats, less than 259m cholesterol, less than 1.5gm of sodium, & 5 or more servings of fruits and vegetables daily;Weight Loss: Understanding of general recommendations for a balanced deficit meal plan, which promotes 1-2 lb weight loss per week and includes a negative energy balance of [phone] kcal/d;Understanding recommendations for meals to include 15-35% energy as protein, 25-35% energy from fat, 35-60% energy from carbohydrates, less than 2057mof dietary cholesterol, 20-35 gm of total fiber daily;Understanding of distribution of calorie intake throughout the day with the consumption of 4-5 meals/snacks   Sedentary Yes   Intervention Provide advice, education, support and counseling about physical activity/exercise needs.;Develop an individualized exercise prescription for aerobic and resistive training based on  initial evaluation findings, risk stratification,  comorbidities and participant's personal goals.   Expected Outcomes Achievement of increased cardiorespiratory fitness and enhanced flexibility, muscular endurance and strength shown through measurements of functional capacity and personal statement of participant.   Increase Strength and Stamina Yes   Intervention Provide advice, education, support and counseling about physical activity/exercise needs.;Develop an individualized exercise prescription for aerobic and resistive training based on initial evaluation findings, risk stratification, comorbidities and participant's personal goals.   Expected Outcomes Achievement of increased cardiorespiratory fitness and enhanced flexibility, muscular endurance and strength shown through measurements of functional capacity and personal statement of participant.   Improve shortness of breath with ADL's Yes   Intervention Provide education, individualized exercise plan and daily activity instruction to help decrease symptoms of SOB with activities of daily living.   Expected Outcomes Short Term: Achieves a reduction of symptoms when performing activities of daily living.   Develop more efficient breathing techniques such as purse lipped breathing and diaphragmatic breathing; and practicing self-pacing with activity Yes   Intervention Provide education, demonstration and support about specific breathing techniuqes utilized for more efficient breathing. Include techniques such as pursed lipped breathing, diaphragmatic breathing and self-pacing activity.   Expected Outcomes Short Term: Participant will be able to demonstrate and use breathing techniques as needed throughout daily activities.   Increase knowledge of respiratory medications and ability to use respiratory devices properly  Yes   Intervention Provide education and demonstration as needed of appropriate use of medications, inhalers, and oxygen therapy.   Expected Outcomes Short Term: Achieves  understanding of medications use. Understands that oxygen is a medication prescribed by physician. Demonstrates appropriate use of inhaler and oxygen therapy.   Diabetes Yes   Intervention Provide education about signs/symptoms and action to take for hypo/hyperglycemia.;Provide education about proper nutrition, including hydration, and aerobic/resistive exercise prescription along with prescribed medications to achieve blood glucose in normal ranges: Fasting glucose 65-99 mg/dL   Expected Outcomes Short Term: Participant verbalizes understanding of the signs/symptoms and immediate care of hyper/hypoglycemia, proper foot care and importance of medication, aerobic/resistive exercise and nutrition plan for blood glucose control.;Long Term: Attainment of HbA1C < 7%.   Heart Failure Yes   Intervention Provide a combined exercise and nutrition program that is supplemented with education, support and counseling about heart failure. Directed toward relieving symptoms such as shortness of breath, decreased exercise tolerance, and extremity edema.   Expected Outcomes Improve functional capacity of life;Short term: Attendance in program 2-3 days a week with increased exercise capacity. Reported lower sodium intake. Reported increased fruit and vegetable intake. Reports medication compliance.;Short term: Daily weights obtained and reported for increase. Utilizing diuretic protocols set by physician.;Long term: Adoption of self-care skills and reduction of barriers for early signs and symptoms recognition and intervention leading to self-care maintenance.   Hypertension Yes   Intervention Provide education on lifestyle modifcations including regular physical activity/exercise, weight management, moderate sodium restriction and increased consumption of fresh fruit, vegetables, and low fat dairy, alcohol moderation, and smoking cessation.;Monitor prescription use compliance.   Expected Outcomes Short Term: Continued  assessment and intervention until BP is < 140/75m HG in hypertensive participants. < 130/862mHG in hypertensive participants with diabetes, heart failure or chronic kidney disease.;Long Term: Maintenance of blood pressure at goal levels.   Lipids Yes   Intervention Provide education and support for participant on nutrition & aerobic/resistive exercise along with prescribed medications to achieve LDL <7030mHDL >75m76m Expected Outcomes Short Term: Participant states understanding of  desired cholesterol values and is compliant with medications prescribed. Participant is following exercise prescription and nutrition guidelines.;Long Term: Cholesterol controlled with medications as prescribed, with individualized exercise RX and with personalized nutrition plan. Value goals: LDL < 39m, HDL > 40 mg.      Core Components/Risk Factors/Patient Goals Review:      Goals and Risk Factor Review    Row Name 02/07/16 1515             Core Components/Risk Factors/Patient Goals Review   Personal Goals Review Sedentary;Increase Strength and Stamina;Develop more efficient breathing techniques such as purse lipped breathing and diaphragmatic breathing and practicing self-pacing with activity.       Review Ms SLaflamhas attended 3 sessions of pulmonary rehab.  She has obtained her exercise goals on the BioStep and T5 and performed interval training on the treadmill. Her PLB technique is good and her vital signs are acceptable. She is compliant with her diabetes, lipids, and blood pressure medications.       Expected Outcomes Continue exercising in [facility] to increase stamina and strength.          Core Components/Risk Factors/Patient Goals at Discharge (Final Review):      Goals and Risk Factor Review - 02/07/16 1515      Core Components/Risk Factors/Patient Goals Review   Personal Goals Review Sedentary;Increase Strength and Stamina;Develop more efficient breathing techniques such as purse lipped  breathing and diaphragmatic breathing and practicing self-pacing with activity.   Review Ms SMckimmyhas attended 3 sessions of pulmonary rehab.  She has obtained her exercise goals on the BioStep and T5 and performed interval training on the treadmill. Her PLB technique is good and her vital signs are acceptable. She is compliant with her diabetes, lipids, and blood pressure medications.   Expected Outcomes Continue exercising in [facility] to increase stamina and strength.      ITP Comments:   Comments: 30 day note review

## 2016-02-14 NOTE — Telephone Encounter (Signed)
Called [name] - she last attended 01/29/16. [name] has been short of breath and has had a stomach virus. She is making an appointment with Dr Montel Culver and hopes to return to [place] soon.

## 2016-02-22 ENCOUNTER — Ambulatory Visit: Payer: Medicaid Other | Admitting: Family

## 2016-02-23 ENCOUNTER — Other Ambulatory Visit: Payer: Self-pay

## 2016-02-23 ENCOUNTER — Encounter: Payer: Self-pay | Admitting: Emergency Medicine

## 2016-02-23 ENCOUNTER — Emergency Department: Payer: Medicaid Other

## 2016-02-23 ENCOUNTER — Emergency Department
Admission: EM | Admit: 2016-02-23 | Discharge: 2016-02-23 | Disposition: A | Discharge status: Home / Self Care | Payer: Medicaid Other | Attending: Emergency Medicine | Admitting: Emergency Medicine

## 2016-02-23 DIAGNOSIS — I509 Heart failure, unspecified: Secondary | ICD-10-CM | POA: Diagnosis not present

## 2016-02-23 DIAGNOSIS — J181 Lobar pneumonia, unspecified organism: Secondary | ICD-10-CM | POA: Insufficient documentation

## 2016-02-23 DIAGNOSIS — I130 Hypertensive heart and chronic kidney disease with heart failure and stage 1 through stage 4 chronic kidney disease, or unspecified chronic kidney disease: Secondary | ICD-10-CM | POA: Diagnosis not present

## 2016-02-23 DIAGNOSIS — J189 Pneumonia, unspecified organism: Secondary | ICD-10-CM

## 2016-02-23 DIAGNOSIS — J441 Chronic obstructive pulmonary disease with (acute) exacerbation: Secondary | ICD-10-CM

## 2016-02-23 DIAGNOSIS — J45909 Unspecified asthma, uncomplicated: Secondary | ICD-10-CM | POA: Diagnosis not present

## 2016-02-23 DIAGNOSIS — Z7982 Long term (current) use of aspirin: Secondary | ICD-10-CM | POA: Diagnosis not present

## 2016-02-23 DIAGNOSIS — N189 Chronic kidney disease, unspecified: Secondary | ICD-10-CM | POA: Diagnosis not present

## 2016-02-23 DIAGNOSIS — N3091 Cystitis, unspecified with hematuria: Secondary | ICD-10-CM | POA: Insufficient documentation

## 2016-02-23 DIAGNOSIS — R310 Gross hematuria: Secondary | ICD-10-CM

## 2016-02-23 DIAGNOSIS — E1122 Type 2 diabetes mellitus with diabetic chronic kidney disease: Secondary | ICD-10-CM | POA: Diagnosis not present

## 2016-02-23 DIAGNOSIS — Z794 Long term (current) use of insulin: Secondary | ICD-10-CM | POA: Diagnosis not present

## 2016-02-23 DIAGNOSIS — Z79899 Other long term (current) drug therapy: Secondary | ICD-10-CM | POA: Diagnosis not present

## 2016-02-23 DIAGNOSIS — N3090 Cystitis, unspecified without hematuria: Secondary | ICD-10-CM

## 2016-02-23 DIAGNOSIS — R319 Hematuria, unspecified: Secondary | ICD-10-CM | POA: Diagnosis present

## 2016-02-23 HISTORY — DX: Chronic obstructive pulmonary disease, unspecified: J44.9

## 2016-02-23 LAB — CBC WITH DIFFERENTIAL/PLATELET
BASOS ABS: 0.1 10*3/uL (ref 0–0.1)
Basophils Relative: 1 %
EOS ABS: 0.1 10*3/uL (ref 0–0.7)
EOS PCT: 1 %
HCT: 31.6 % — ABNORMAL LOW (ref 35.0–47.0)
Hemoglobin: 10.1 g/dL — ABNORMAL LOW (ref 12.0–16.0)
LYMPHS PCT: 8 %
Lymphs Abs: 0.7 10*3/uL — ABNORMAL LOW (ref 1.0–3.6)
MCH: 27.8 pg (ref 26.0–34.0)
MCHC: 32 g/dL (ref 32.0–36.0)
MCV: 86.9 fL (ref 80.0–100.0)
MONO ABS: 0.4 10*3/uL (ref 0.2–0.9)
Monocytes Relative: 5 %
Neutro Abs: 7.2 10*3/uL — ABNORMAL HIGH (ref 1.4–6.5)
Neutrophils Relative %: 85 %
PLATELETS: 344 10*3/uL (ref 150–440)
RBC: 3.63 MIL/uL — ABNORMAL LOW (ref 3.80–5.20)
RDW: 15.1 % — AB (ref 11.5–14.5)
WBC: 8.5 10*3/uL (ref 3.6–11.0)

## 2016-02-23 LAB — BLOOD GAS, VENOUS
ACID-BASE EXCESS: 12 mmol/L — AB (ref 0.0–2.0)
BICARBONATE: 38 mmol/L — AB (ref 20.0–28.0)
FIO2: 0.21
O2 Saturation: 84.3 %
PATIENT TEMPERATURE: 37
PH VEN: 7.44 — AB (ref 7.250–7.430)
PO2 VEN: 47 mmHg — AB (ref 32.0–45.0)
pCO2, Ven: 56 mmHg (ref 44.0–60.0)

## 2016-02-23 LAB — URINALYSIS, COMPLETE (UACMP) WITH MICROSCOPIC
Bilirubin Urine: NEGATIVE
GLUCOSE, UA: 150 mg/dL — AB
KETONES UR: NEGATIVE mg/dL
Nitrite: POSITIVE — AB
PH: 5 (ref 5.0–8.0)
Protein, ur: >=300 mg/dL — AB
Specific Gravity, Urine: 1.02 (ref 1.005–1.030)

## 2016-02-23 LAB — BASIC METABOLIC PANEL
Anion gap: 6 (ref 5–15)
BUN: 30 mg/dL — AB (ref 6–20)
CALCIUM: 8.2 mg/dL — AB (ref 8.9–10.3)
CO2: 35 mmol/L — ABNORMAL HIGH (ref 22–32)
CREATININE: 1.6 mg/dL — AB (ref 0.44–1.00)
Chloride: 95 mmol/L — ABNORMAL LOW (ref 101–111)
GFR calc Af Amer: 44 mL/min — ABNORMAL LOW (ref >60)
GFR, EST NON AFRICAN AMERICAN: 38 mL/min — AB (ref >60)
GLUCOSE: 191 mg/dL — AB (ref 65–99)
Potassium: 4.7 mmol/L (ref 3.5–5.1)
SODIUM: 136 mmol/L (ref 135–145)

## 2016-02-23 IMAGING — CR DG CHEST 2V
2 series · 3 of 3 positions shown · non-contrast
Comparison: Radiographs [DATE].

CLINICAL DATA: Chronic shortness of breath, productive cough.

EXAM:
CHEST  2 VIEW

[chest lat]
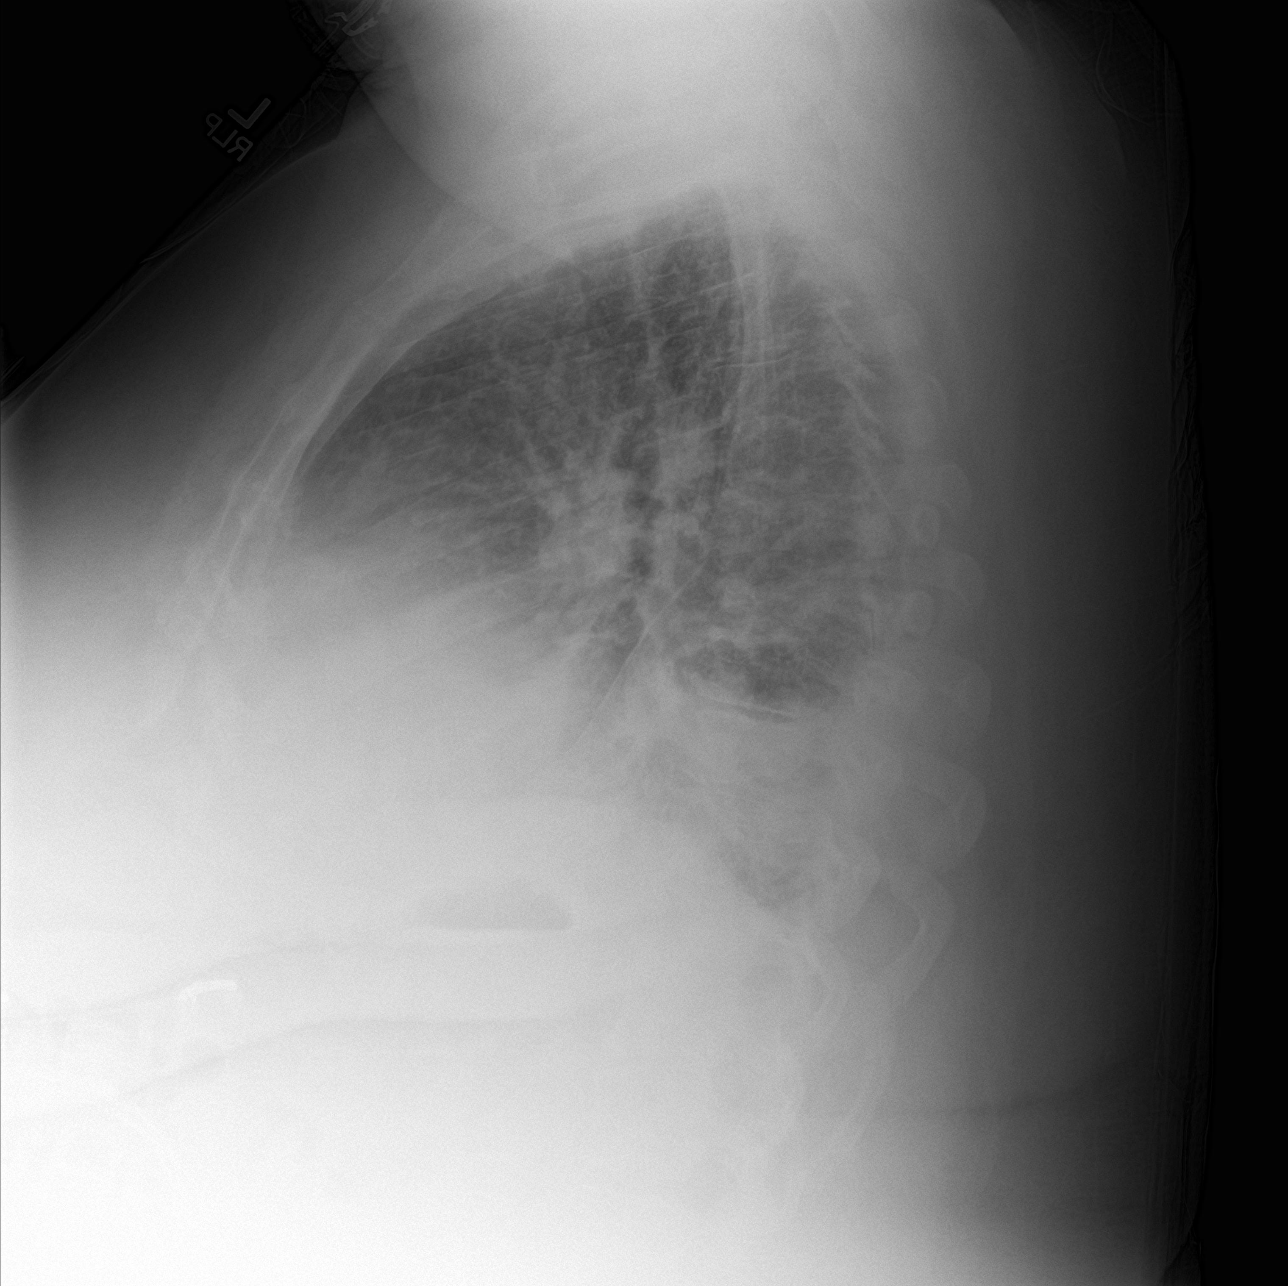

[Series 3: chest ap · 0.14mm/px · 2 of 2 slices shown]
[im 1/2]
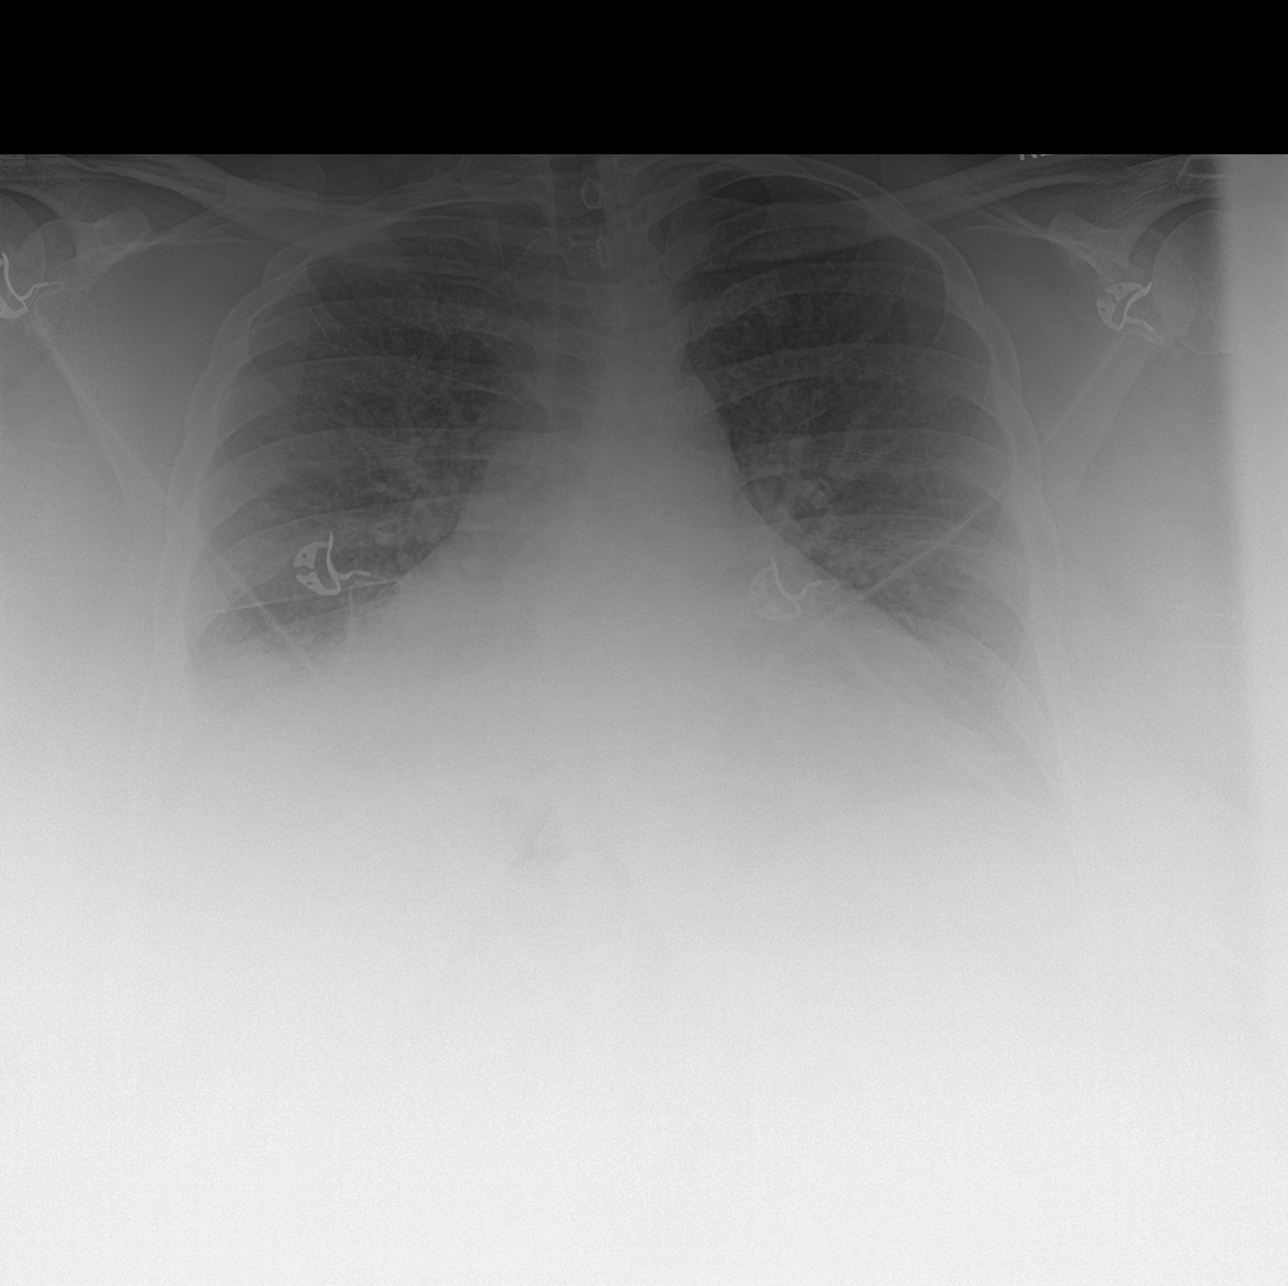
[im 2/2]
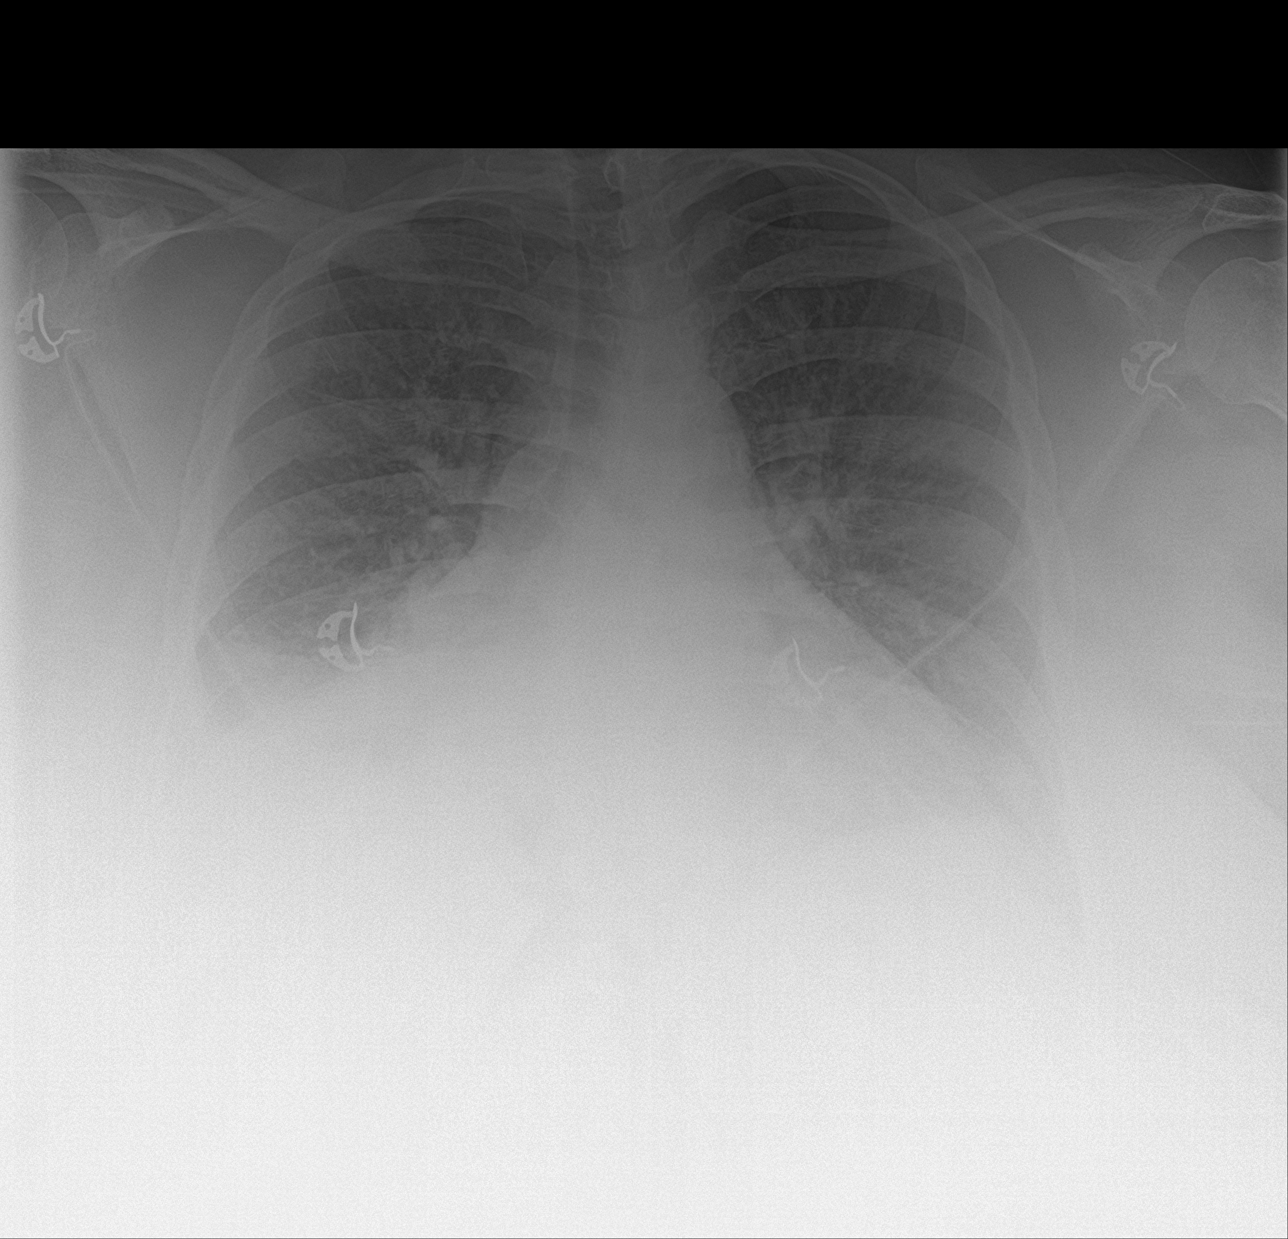

[3 of 3 positions shown; findings below may reference images not displayed]

FINDINGS: Stable cardiomegaly. No pneumothorax is noted. Mild central
pulmonary vascular congestion is noted. New right basilar opacity is
noted concerning for atelectasis or infiltrate with possible
associated pleural effusion. Bony thorax is unremarkable.
IMPRESSION: New right basilar opacity concerning for pneumonia or atelectasis
with associated pleural effusion.

## 2016-02-23 MED ORDER — ALBUTEROL SULFATE (2.5 MG/3ML) 0.083% IN NEBU
5.0000 mg | INHALATION_SOLUTION | Freq: Once | RESPIRATORY_TRACT | Status: AC
  Administered 2016-02-23: 5 mg via RESPIRATORY_TRACT
  Filled 2016-02-23: qty 6

## 2016-02-23 MED ORDER — LEVOFLOXACIN 750 MG PO TABS
750.0000 mg | ORAL_TABLET | Freq: Once | ORAL | Status: AC
  Administered 2016-02-23: 750 mg via ORAL
  Filled 2016-02-23: qty 1

## 2016-02-23 MED ORDER — LEVOFLOXACIN 750 MG PO TABS: 750.0000 mg | ORAL_TABLET | Freq: Every day | ORAL | 0 refills | Status: DC

## 2016-02-23 MED ORDER — PREDNISONE 20 MG PO TABS
60.0000 mg | ORAL_TABLET | ORAL | Status: AC
  Administered 2016-02-23: 60 mg via ORAL
  Filled 2016-02-23: qty 3

## 2016-02-23 MED ORDER — PREDNISONE 20 MG PO TABS: 40.0000 mg | ORAL_TABLET | Freq: Every day | ORAL | 0 refills | Status: DC

## 2016-02-23 MED ORDER — IPRATROPIUM-ALBUTEROL 0.5-2.5 (3) MG/3ML IN SOLN
3.0000 mL | Freq: Once | RESPIRATORY_TRACT | Status: AC
  Administered 2016-02-23: 3 mL via RESPIRATORY_TRACT
  Filled 2016-02-23: qty 3

## 2016-02-23 NOTE — ED Notes (Signed)
Patient placed on 6 Liter via [place]

## 2016-02-23 NOTE — ED Notes (Signed)
Patient 02 sats improved to 96% on 6 liters [place]. Lung sounds diminished at the bases bilaterally. Patient with congested sounding cough.  Patient reports that she is getting sputum up when she coughs, sometimes it is green/yellow, sometimes it is white, pt reports that it is going back and forth.

## 2016-02-23 NOTE — ED Provider Notes (Signed)
[facility] Emergency Department Provider Note  ____________________________________________  Time seen: Approximately 8:20 PM  I have reviewed the triage vital signs and the nursing notes.   HISTORY  Chief Complaint Hematuria    HPI CECIL BIXBY is a 44 y.o. female who complains of hematuria since last night. She went to her doctor's office who told her to come to the ED for evaluation of hematuria. Denies dysuria but does have some lower abdominal pain.  No fever chills vomiting or diarrhea.  Also has increased shortness of breath and productive cough for the past few days. She has a history of severe COPD on 4 L nasal cannula at all times. No chest pain.     Past Medical History:  Diagnosis Date  . Asthma   . CHF (congestive heart failure) ([place])   . COPD (chronic obstructive pulmonary disease) ([place])   . Diabetes mellitus without complication ([place])   . Hypertension   . Iron deficiency anemia   . Pericardial effusion    a. 11/2014 CT Chest: Mod effusion.  [name] Poorly controlled diabetes mellitus ([place])    a. 11/2014 A1c 13.2.     Patient Active Problem List   Diagnosis Date Noted  . Anxiety 10/02/2015  . Asthma 08/30/2015  . Poorly controlled type 2 diabetes mellitus ([place]) 03/13/2015  . Iron deficiency anemia 02/17/2015  . Morbid obesity due to excess calories ([place])   . Shortness of breath   . Essential hypertension   . CHF (congestive heart failure) ([place]) 01/01/2015  . Hypertensive heart disease with congestive heart failure ([place]) 12/18/2014  . Diabetes ([place]) 12/15/2014     Past Surgical History:  Procedure Laterality Date  . CESAREAN SECTION       Prior to Admission medications   Medication Sig Start Date End Date Taking? Authorizing Provider  acetaminophen (TYLENOL) 500 MG tablet Take 1,000 mg by mouth every 6 (six) hours as needed for mild pain, fever or headache.    Historical Provider, MD  albuterol (PROVENTIL HFA;VENTOLIN  HFA) 108 (90 BASE) MCG/ACT inhaler Inhale 2 puffs into the lungs every 6 (six) hours as needed for wheezing or shortness of breath. 12/19/14   Dustin Flock, MD  amLODipine (NORVASC) 10 MG tablet Take 1 tablet (10 mg total) by mouth daily. 12/19/14   Dustin Flock, MD  aspirin EC 81 MG tablet Take 81 mg by mouth at bedtime.    Historical Provider, MD  budesonide (PULMICORT) 0.25 MG/2ML nebulizer solution Take 2 mLs (0.25 mg total) by nebulization 2 (two) times daily. 12/19/14   Dustin Flock, MD  citalopram (CELEXA) 40 MG tablet Take 40 mg by mouth daily.    Historical Provider, MD  guaiFENesin-dextromethorphan (ROBITUSSIN DM) 100-10 MG/5ML syrup Take 5 mLs by mouth every 4 (four) hours as needed for cough. 08/13/15   Demetrios Loll, MD  hydrALAZINE (APRESOLINE) 100 MG tablet Take 1 tablet (100 mg total) by mouth every 8 (eight) hours. 12/19/14   Dustin Flock, MD  insulin aspart (NOVOLOG FLEXPEN) 100 UNIT/ML FlexPen Inject 0-12 Units into the skin 3 (three) times daily with meals as needed for high blood sugar. Pt uses as needed per sliding scale.    Historical Provider, MD  Insulin Degludec (TRESIBA FLEXTOUCH) 100 UNIT/ML SOPN Inject 45 Units into the skin at bedtime. 08/13/15   Demetrios Loll, MD  ipratropium-albuterol (DUONEB) 0.5-2.5 (3) MG/3ML SOLN Take 3 mLs by nebulization every 6 (six) hours as needed (for wheezing/shortness of breath).    Historical Provider,  MD  levofloxacin (LEVAQUIN) 750 MG tablet Take 1 tablet (750 mg total) by mouth daily. 02/23/16   Carrie Mew, MD  losartan (COZAAR) 50 MG tablet Take 1 tablet (50 mg total) by mouth daily. 01/03/15   Dustin Flock, MD  metoprolol (LOPRESSOR) 50 MG tablet Take 50 mg by mouth 2 (two) times daily.    Historical Provider, MD  potassium chloride (K-DUR,KLOR-CON) 10 MEQ tablet Take 1 tablet (10 mEq total) by mouth 2 (two) times daily. 12/19/14   Dustin Flock, MD  predniSONE (DELTASONE) 20 MG tablet Take 2 tablets (40 mg total) by mouth daily.  02/23/16   Carrie Mew, MD  simvastatin (ZOCOR) 40 MG tablet Take 40 mg by mouth at bedtime.    Historical Provider, MD  torsemide (DEMADEX) 20 MG tablet Take 2 tablets (40 mg total) by mouth daily. 08/13/15   Demetrios Loll, MD     Allergies Feraheme [ferumoxytol] and Sulfa antibiotics   Family History  Problem Relation Age of Onset  . Diabetes Mother   . Hypertension Mother   . Hypertension Father   . Diabetes Father   . Hypertension    . Diabetes    . Breast cancer Maternal Aunt 61  . Breast cancer Maternal Aunt 50    Social History Social History  Substance Use Topics  . Smoking status: Never Smoker  . Smokeless tobacco: Never Used  . Alcohol use 0.0 oz/week     Comment: occ    Review of Systems  Constitutional:   No fever or chills.  ENT:   No sore throat. No rhinorrhea. Cardiovascular:   No chest pain. Respiratory:   Positive shortness of breath and productive cough. Gastrointestinal:   Positive abdominal pain without vomiting and diarrhea.  Genitourinary:   Positive hematuria. Musculoskeletal:   Positive chronic back pain Neurological:   Negative for headaches 10-point ROS otherwise negative.  ____________________________________________   PHYSICAL EXAM:  VITAL SIGNS: ED Triage Vitals  Enc Vitals Group     BP 02/23/16 1611 (!) 152/83     Pulse Rate 02/23/16 1611 88     Resp 02/23/16 1830 (!) 28     Temp 02/23/16 1611 99.2 F (37.3 C)     Temp Source 02/23/16 1611 Oral     SpO2 02/23/16 1611 (!) 74 %     Weight 02/23/16 1609 (!) 380 lb (172.4 kg)     Height 02/23/16 1609 5\' 4"  (1.626 m)     Head Circumference --      Peak Flow --      Pain Score 02/23/16 1609 7     Pain Loc --      Pain Edu? --      Excl. in GC? --   Oxygen saturation 94-96% on baseline 4 L nasal cannula on my exam  Vital signs reviewed, nursing assessments reviewed.   Constitutional:   Alert and oriented. Well appearing and in no distress. Eyes:   No scleral icterus. No  conjunctival pallor. PERRL. EOMI.  No nystagmus. ENT   Head:   Normocephalic and atraumatic.   Nose:   No congestion/rhinnorhea. No septal hematoma   Mouth/Throat:   MMM, no pharyngeal erythema. No peritonsillar mass.    Neck:   No stridor. No SubQ emphysema. No meningismus. Hematological/Lymphatic/Immunilogical:   No cervical lymphadenopathy. Cardiovascular:   RRR. Symmetric bilateral radial and DP pulses.  No murmurs.  Respiratory:   Normal respiratory effort without tachypnea nor retractions. Diffuse expiratory wheezing, accentuated by forceful rapid expiration which  also provokes coughing. No focal consolidative findings  Gastrointestinal:   Soft and nontender. Non distended. There is no CVA tenderness.  No rebound, rigidity, or guarding. Abdominal wall is edematous which patient states is baseline Genitourinary:   deferred Musculoskeletal:   Nontender with normal range of motion in all extremities. No joint effusions.  No lower extremity tenderness.  2+ pitting edema, baseline per patient. Neurologic:   Normal speech and language.  CN 2-10 normal. Motor grossly intact. No gross focal neurologic deficits are appreciated.  Skin:    Skin is warm, dry and intact. No rash noted.  No petechiae, purpura, or bullae.  ____________________________________________    LABS (pertinent positives/negatives) (all labs ordered are listed, but only abnormal results are displayed) Labs Reviewed  BASIC METABOLIC PANEL - Abnormal; Notable for the following:       Result Value   Chloride 95 (*)    CO2 35 (*)    Glucose, Bld 191 (*)    BUN 30 (*)    Creatinine, Ser 1.60 (*)    Calcium 8.2 (*)    GFR calc non Af Amer 38 (*)    GFR calc Af Amer 44 (*)    All other components within normal limits  CBC WITH DIFFERENTIAL/PLATELET - Abnormal; Notable for the following:    RBC 3.63 (*)    Hemoglobin 10.1 (*)    HCT 31.6 (*)    RDW 15.1 (*)    Neutro Abs 7.2 (*)    Lymphs Abs 0.7 (*)     All other components within normal limits  URINALYSIS, COMPLETE (UACMP) WITH MICROSCOPIC - Abnormal; Notable for the following:    Color, Urine AMBER (*)    APPearance CLOUDY (*)    Glucose, UA 150 (*)    Hgb urine dipstick LARGE (*)    Protein, ur >=300 (*)    Nitrite POSITIVE (*)    Leukocytes, UA TRACE (*)    Bacteria, UA MANY (*)    Squamous Epithelial / LPF 6-30 (*)    All other components within normal limits  BLOOD GAS, VENOUS - Abnormal; Notable for the following:    pH, Ven 7.44 (*)    pO2, Ven 47.0 (*)    Bicarbonate 38.0 (*)    Acid-Base Excess 12.0 (*)    All other components within normal limits  URINE CULTURE   ____________________________________________   EKG  Interpreted by me Sinus rhythm rate of 82, normal axis intervals QRS ST segments and T waves.  ____________________________________________    RADIOLOGY  Chest x-ray reveals right basilar infiltrate concerning for pneumonia  ____________________________________________   PROCEDURES Procedures  ____________________________________________   INITIAL IMPRESSION / ASSESSMENT AND PLAN / ED COURSE  Pertinent labs & imaging results that were available during my care of the patient were reviewed by me and considered in my medical decision making (see chart for details).  Patient not in distress but presents with wheezing, x-ray compatible with community acquired pneumonia. Her sensation consistent with COPD exacerbation. Patient given a series of nebs and prednisone and on reassessment at 8:00 PM, lungs are clear to auscultation bilaterally and she feels much better. Oxygen saturation remains 94-96% on her baseline 4 L nasal cannula.  Additionally, for her concerns of hematuria, labs show normal white blood cell count. Creatinine is at baseline for her CK D of 1.6. Urinalysis shows large amount of red cells white cells and nitrite positive consistent with cystitis.  I'll have her take prednisone and  continue her bronchodilators  at home for the COPD exacerbation. The pneumonia and urinary tract infection Treated concurrently with oral Levaquin. I have counseled the patient on avoiding exertion and being cautious of muscular skeletal injury with a combination of Levaquin and prednisone. I don't think these will cause any significant interactions with her other home medications.  Septic otherwise is feeling well and back to baseline symptoms was. Suitable for discharge home and outpatient follow-up. Return precautions given.     Clinical Course    ____________________________________________   FINAL CLINICAL IMPRESSION(S) / ED DIAGNOSES  Final diagnoses:  Cystitis  Gross hematuria  COPD exacerbation ([place])  Community acquired pneumonia of right lower lobe of lung ([place])  Chronic kidney disease, unspecified CKD stage      New Prescriptions   LEVOFLOXACIN (LEVAQUIN) 750 MG TABLET    Take 1 tablet (750 mg total) by mouth daily.   PREDNISONE (DELTASONE) 20 MG TABLET    Take 2 tablets (40 mg total) by mouth daily.     Portions of this note were generated with dragon dictation software. Dictation errors may occur despite best attempts at proofreading.    Carrie Mew, MD 02/23/16 2025

## 2016-02-23 NOTE — ED Notes (Signed)
Report from anna, rn.  

## 2016-02-23 NOTE — ED Triage Notes (Signed)
Patient states that last night she noticed that there was blood in her urine. Pt states that she had a urine specimen done at her doctors office and her doctor called her and said it was more blood than urine and that pt needed to come in to be evaluated. Patient takes 1 81 mg Aspirin everyday. Patient denies dysuria or polyuria. Patient states that she does have some pressure and back pain but back pain is not a new symptom.

## 2016-02-23 NOTE — ED Notes (Signed)
Pt sipping on po fluids. Pt appears in no acute distress. Pt updated on need for urine sample. Pt verbalizes understanding. Call bell at right side. Family at bedside.

## 2016-02-26 LAB — URINE CULTURE: Culture: >=100000 — AB

## 2016-02-28 ENCOUNTER — Encounter: Payer: Medicaid Other | Attending: Internal Medicine

## 2016-02-28 DIAGNOSIS — J449 Chronic obstructive pulmonary disease, unspecified: Secondary | ICD-10-CM | POA: Insufficient documentation

## 2016-02-28 DIAGNOSIS — I509 Heart failure, unspecified: Secondary | ICD-10-CM | POA: Insufficient documentation

## 2016-02-29 ENCOUNTER — Ambulatory Visit: Payer: Medicaid Other | Admitting: Family

## 2016-03-04 ENCOUNTER — Telehealth: Payer: Self-pay | Admitting: *Deleted

## 2016-03-04 ENCOUNTER — Encounter: Payer: Self-pay | Admitting: *Deleted

## 2016-03-04 DIAGNOSIS — J45909 Unspecified asthma, uncomplicated: Secondary | ICD-10-CM

## 2016-03-04 DIAGNOSIS — I509 Heart failure, unspecified: Secondary | ICD-10-CM

## 2016-03-04 DIAGNOSIS — I5032 Chronic diastolic (congestive) heart failure: Secondary | ICD-10-CM

## 2016-03-04 NOTE — Progress Notes (Signed)
[facility]  [phone] [phone]  ID: [name] OB: 07-24-1970  MR#: [id number]  QJJ#:[id number]  Patient Care Team: Kasandra Knudsen, NP as PCP - General (Nurse Practitioner) Minna Merritts, MD as Consulting Physician (Cardiology) Alisa Graff, FNP as Nurse Practitioner (Family Medicine)  CHIEF COMPLAINT: Iron deficiency anemia.  INTERVAL HISTORY: Patient returns to clinic today for repeat laboratory work and further evaluation. She has chronic weakness and fatigue, that is essentially unchanged. Her blood pressure is significantly elevated, but she has no neurologic complaints. She states her blood pressure medication does not make her feel well and she has not taken it in several days. She also continues to have chronic shortness of breath and requires oxygen 24 hours per day.  She denies any fevers. She has no chest pain, cough, or hemoptysis. She denies any nausea, vomiting, consultation, or diarrhea. She has no melena or hematochezia. Patient otherwise feels well and offers no further specific complaints.  REVIEW OF SYSTEMS:   Review of Systems  Constitutional: Positive for malaise/fatigue. Negative for fever and weight loss.  Respiratory: Positive for shortness of breath. Negative for hemoptysis and sputum production.   Cardiovascular: Negative.  Negative for chest pain.  Gastrointestinal: Negative.  Negative for abdominal pain, blood in stool and melena.  Genitourinary: Negative.   Musculoskeletal: Negative.   Neurological: Positive for weakness.  Psychiatric/Behavioral: Negative.  The patient is not nervous/anxious.     As per HPI. Otherwise, a complete review of systems is negative.  PAST MEDICAL HISTORY: Past Medical History:  Diagnosis Date  . Asthma   . CHF (congestive heart failure) ([place])   . COPD (chronic obstructive pulmonary disease) ([place])   . Diabetes mellitus without complication ([place])   . Hypertension   . Iron  deficiency anemia   . Pericardial effusion    a. 11/2014 CT Chest: Mod effusion.  [name] Poorly controlled diabetes mellitus ([place])    a. 11/2014 A1c 13.2.    PAST SURGICAL HISTORY: Past Surgical History:  Procedure Laterality Date  . CESAREAN SECTION      FAMILY HISTORY Family History  Problem Relation Age of Onset  . Diabetes Mother   . Hypertension Mother   . Hypertension Father   . Diabetes Father   . Hypertension    . Diabetes    . Breast cancer Maternal Aunt 33  . Breast cancer Maternal Aunt 54       ADVANCED DIRECTIVES:    HEALTH MAINTENANCE: Social History  Substance Use Topics  . Smoking status: Never Smoker  . Smokeless tobacco: Never Used  . Alcohol use 0.0 oz/week     Comment: occ     Colonoscopy:  PAP:  Bone density:  Lipid panel:  Allergies  Allergen Reactions  . Feraheme [Ferumoxytol] Itching  . Sulfa Antibiotics Itching    Current Outpatient Prescriptions  Medication Sig Dispense Refill  . acetaminophen (TYLENOL) 500 MG tablet Take 1,000 mg by mouth every 6 (six) hours as needed for mild pain, fever or headache.    . albuterol (PROVENTIL HFA;VENTOLIN HFA) 108 (90 BASE) MCG/ACT inhaler Inhale 2 puffs into the lungs every 6 (six) hours as needed for wheezing or shortness of breath. 1 Inhaler 0  . amLODipine (NORVASC) 10 MG tablet Take 1 tablet (10 mg total) by mouth daily. 30 tablet 0  . aspirin EC 81 MG tablet Take 81 mg by mouth at bedtime.    . budesonide (PULMICORT) 0.25 MG/2ML nebulizer solution Take 2 mLs (0.25  mg total) by nebulization 2 (two) times daily. 60 mL 12  . citalopram (CELEXA) 40 MG tablet Take 40 mg by mouth daily.    [name] guaiFENesin-dextromethorphan (ROBITUSSIN DM) 100-10 MG/5ML syrup Take 5 mLs by mouth every 4 (four) hours as needed for cough. 118 mL 0  . hydrALAZINE (APRESOLINE) 100 MG tablet Take 1 tablet (100 mg total) by mouth every 8 (eight) hours. 60 tablet 0  . insulin aspart (NOVOLOG FLEXPEN) 100 UNIT/ML FlexPen Inject  0-12 Units into the skin 3 (three) times daily with meals as needed for high blood sugar. Pt uses as needed per sliding scale.    . Insulin Degludec (TRESIBA FLEXTOUCH) 100 UNIT/ML SOPN Inject 45 Units into the skin at bedtime. 1 pen 2  . ipratropium-albuterol (DUONEB) 0.5-2.5 (3) MG/3ML SOLN Take 3 mLs by nebulization every 6 (six) hours as needed (for wheezing/shortness of breath).    . losartan (COZAAR) 50 MG tablet Take 1 tablet (50 mg total) by mouth daily. 30 tablet 0  . metFORMIN (GLUCOPHAGE) 500 MG tablet Take 500 mg by mouth 2 (two) times daily with a meal.    . metoprolol (LOPRESSOR) 50 MG tablet Take 50 mg by mouth 2 (two) times daily.    . potassium chloride (K-DUR,KLOR-CON) 10 MEQ tablet Take 1 tablet (10 mEq total) by mouth 2 (two) times daily. 30 tablet 0  . simvastatin (ZOCOR) 40 MG tablet Take 40 mg by mouth at bedtime.    . torsemide (DEMADEX) 20 MG tablet Take 2 tablets (40 mg total) by mouth daily. 30 tablet 0  . levofloxacin (LEVAQUIN) 750 MG tablet Take 1 tablet (750 mg total) by mouth daily. (Patient not taking: Reported on 03/05/2016) 7 tablet 0  . predniSONE (DELTASONE) 20 MG tablet Take 2 tablets (40 mg total) by mouth daily. (Patient not taking: Reported on 03/05/2016) 6 tablet 0   No current facility-administered medications for this visit.     OBJECTIVE: Vitals:   03/05/16 1429  Resp: 18  Temp: 98.5 F (36.9 C)     There is no height or weight on file to calculate BMI.    ECOG FS:1 - Symptomatic but completely ambulatory  General: Well-developed, well-nourished, no acute distress. Eyes: Pink conjunctiva, anicteric sclera. Lungs: Clear to auscultation bilaterally. Heart: Regular rate and rhythm. No rubs, murmurs, or gallops. Abdomen: Soft, nontender, nondistended. No organomegaly noted, normoactive bowel sounds. Musculoskeletal: No edema, cyanosis, or clubbing. Neuro: Alert, answering all questions appropriately. Cranial nerves grossly intact. Skin: No rashes or  petechiae noted. Psych: Normal affect.   LAB RESULTS:  Lab Results  Component Value Date   NA 136 02/23/2016   K 4.7 02/23/2016   CL 95 (L) 02/23/2016   CO2 35 (H) 02/23/2016   GLUCOSE 191 (H) 02/23/2016   BUN 30 (H) 02/23/2016   CREATININE 1.60 (H) 02/23/2016   CALCIUM 8.2 (L) 02/23/2016   PROT 5.6 (L) 08/09/2015   ALBUMIN 2.3 (L) 08/09/2015   AST 30 08/09/2015   ALT 19 08/09/2015   ALKPHOS 138 (H) 08/09/2015   BILITOT 0.3 08/09/2015   GFRNONAA 38 (L) 02/23/2016   GFRAA 44 (L) 02/23/2016    Lab Results  Component Value Date   WBC 6.6 03/05/2016   NEUTROABS 5.2 03/05/2016   HGB 10.1 (L) 03/05/2016   HCT 31.7 (L) 03/05/2016   MCV 86.0 03/05/2016   PLT 227 03/05/2016   Lab Results  Component Value Date   IRON 55 03/05/2016   TIBC 266 03/05/2016   IRONPCTSAT  21 03/05/2016    Lab Results  Component Value Date   FERRITIN 44 03/05/2016     STUDIES: Dg Chest 2 View  Result Date: 02/23/2016 CLINICAL DATA:  Chronic shortness of breath, productive cough. EXAM: CHEST  2 VIEW COMPARISON:  Radiographs of August 09, 2015. FINDINGS: Stable cardiomegaly. No pneumothorax is noted. Mild central pulmonary vascular congestion is noted. New right basilar opacity is noted concerning for atelectasis or infiltrate with possible associated pleural effusion. Bony thorax is unremarkable. IMPRESSION: New right basilar opacity concerning for pneumonia or atelectasis with associated pleural effusion. Electronically Signed   By: Marijo Conception, M.D.   On: 02/23/2016 17:37    ASSESSMENT: Iron deficiency anemia.  PLAN:    1. Iron deficiency anemia: Likely secondary to heavy menses. [name] hemoglobin is decreased, but her iron stores are now within normal limits. She does not require IV Feraheme today. Previously, patient had a mild allergic reaction to Feraheme therefore in addition to her treatment she will receive 20 mg of dexamethasone, 25 mg Benadryl, 50 mg ranitidine, and 650 mg of  Tylenol.  No intervention is needed at this time. Return to clinic in 3 months for repeat laboratory work and further evaluation. 2. Shortness of Breath: Continue oxygen as prescribed. 3. Hypertension: [name] blood pressure is definitely elevated today. Patient was encouraged to take her blood pressure medications as prescribed. Treatment per PCP.  Patient expressed understanding and was in agreement with this plan. She also understands that She can call clinic at any time with any questions, concerns, or complaints.    Lloyd Huger, MD   03/08/2016 8:21 AM

## 2016-03-04 NOTE — Telephone Encounter (Signed)
[name] was called to see if she was planning to return to [facility]. She stated that she does want to return but is not sure when she will be able to come back. She has been in the hospital with pneumonia since she last attended and was given an antibiotic and is now home. She stated that she has been getting out of bed, walking around the house with her walker, and sitting in her chair. Will follow up with her again in a few weeks to see if she know when she will return.

## 2016-03-05 ENCOUNTER — Inpatient Hospital Stay: Payer: Medicaid Other

## 2016-03-05 ENCOUNTER — Inpatient Hospital Stay: Payer: Medicaid Other | Attending: Oncology | Admitting: Oncology

## 2016-03-05 VITALS — Temp 98.5°F | Resp 18

## 2016-03-05 DIAGNOSIS — D509 Iron deficiency anemia, unspecified: Secondary | ICD-10-CM | POA: Diagnosis not present

## 2016-03-05 DIAGNOSIS — Z794 Long term (current) use of insulin: Secondary | ICD-10-CM | POA: Diagnosis not present

## 2016-03-05 DIAGNOSIS — R05 Cough: Secondary | ICD-10-CM | POA: Diagnosis not present

## 2016-03-05 DIAGNOSIS — I313 Pericardial effusion (noninflammatory): Secondary | ICD-10-CM

## 2016-03-05 DIAGNOSIS — I509 Heart failure, unspecified: Secondary | ICD-10-CM | POA: Diagnosis not present

## 2016-03-05 DIAGNOSIS — I10 Essential (primary) hypertension: Secondary | ICD-10-CM

## 2016-03-05 DIAGNOSIS — Z803 Family history of malignant neoplasm of breast: Secondary | ICD-10-CM | POA: Diagnosis not present

## 2016-03-05 DIAGNOSIS — D500 Iron deficiency anemia secondary to blood loss (chronic): Secondary | ICD-10-CM

## 2016-03-05 DIAGNOSIS — R531 Weakness: Secondary | ICD-10-CM

## 2016-03-05 DIAGNOSIS — Z79899 Other long term (current) drug therapy: Secondary | ICD-10-CM | POA: Diagnosis not present

## 2016-03-05 DIAGNOSIS — J90 Pleural effusion, not elsewhere classified: Secondary | ICD-10-CM | POA: Insufficient documentation

## 2016-03-05 DIAGNOSIS — E1165 Type 2 diabetes mellitus with hyperglycemia: Secondary | ICD-10-CM

## 2016-03-05 DIAGNOSIS — J45909 Unspecified asthma, uncomplicated: Secondary | ICD-10-CM

## 2016-03-05 DIAGNOSIS — R0602 Shortness of breath: Secondary | ICD-10-CM | POA: Insufficient documentation

## 2016-03-05 DIAGNOSIS — R0989 Other specified symptoms and signs involving the circulatory and respiratory systems: Secondary | ICD-10-CM | POA: Diagnosis not present

## 2016-03-05 DIAGNOSIS — R5383 Other fatigue: Secondary | ICD-10-CM

## 2016-03-05 DIAGNOSIS — J449 Chronic obstructive pulmonary disease, unspecified: Secondary | ICD-10-CM | POA: Diagnosis not present

## 2016-03-05 DIAGNOSIS — Z7982 Long term (current) use of aspirin: Secondary | ICD-10-CM | POA: Insufficient documentation

## 2016-03-05 LAB — CBC WITH DIFFERENTIAL/PLATELET
BASOS ABS: 0 10*3/uL (ref 0–0.1)
Basophils Relative: 1 %
Eosinophils Absolute: 0.1 10*3/uL (ref 0–0.7)
Eosinophils Relative: 2 %
HEMATOCRIT: 31.7 % — AB (ref 35.0–47.0)
Hemoglobin: 10.1 g/dL — ABNORMAL LOW (ref 12.0–16.0)
LYMPHS PCT: 11 %
Lymphs Abs: 0.7 10*3/uL — ABNORMAL LOW (ref 1.0–3.6)
MCH: 27.4 pg (ref 26.0–34.0)
MCHC: 31.9 g/dL — ABNORMAL LOW (ref 32.0–36.0)
MCV: 86 fL (ref 80.0–100.0)
Monocytes Absolute: 0.5 10*3/uL (ref 0.2–0.9)
Monocytes Relative: 7 %
NEUTROS PCT: 79 %
Neutro Abs: 5.2 10*3/uL (ref 1.4–6.5)
PLATELETS: 227 10*3/uL (ref 150–440)
RBC: 3.69 MIL/uL — AB (ref 3.80–5.20)
RDW: 14 % (ref 11.5–14.5)
WBC: 6.6 10*3/uL (ref 3.6–11.0)

## 2016-03-05 LAB — FERRITIN: Ferritin: 44 ng/mL (ref 11–307)

## 2016-03-05 LAB — IRON AND TIBC
Iron: 55 ug/dL (ref 28–170)
Saturation Ratios: 21 % (ref 10.4–31.8)
TIBC: 266 ug/dL (ref 250–450)
UIBC: 211 ug/dL

## 2016-03-05 NOTE — Progress Notes (Signed)
While in clinic pt's O2 tank was empty. Pt was put the hospital's O2 tank. Pt's son was advised to go home to refill O2 tank so pt did not have to travel home without oxygen. Pt and son declined and pt stated will travel home without oxygen.   Prior to leaving, pt's [facility]. Pt remained in clinic on oxygen until son returned. Pt was advised to take BP meds once gets home. Pt verbalized understanding.

## 2016-03-05 NOTE — Progress Notes (Signed)
Patient is here for follow up, she does have a slight cough.    Recheck o2 and it is slowly coming up, 91 2:37pm patient oxygen tank is on liters    Rechecked bp 193/123 Pulse was 93

## 2016-03-11 ENCOUNTER — Encounter: Payer: Self-pay | Admitting: Respiratory Therapy

## 2016-03-11 DIAGNOSIS — I5032 Chronic diastolic (congestive) heart failure: Secondary | ICD-10-CM

## 2016-03-11 DIAGNOSIS — J45909 Unspecified asthma, uncomplicated: Secondary | ICD-10-CM

## 2016-03-11 NOTE — Progress Notes (Signed)
Pulmonary Individual Treatment Plan  Patient Details  Name: [name] MRN: [id number] Date of Birth: Oct 03, 1970 Referring Provider:   Flowsheet Row Pulmonary Rehab from 01/15/2016 in [facility] Cardiac and Pulmonary Rehab  Referring Provider  Ramashandran MD      Initial Encounter Date:  Flowsheet Row Pulmonary Rehab from 01/15/2016 in [facility] Cardiac and Pulmonary Rehab  Date  01/15/16  Referring Provider  Ramashandran MD      Visit Diagnosis: Asthma, unspecified asthma severity, unspecified whether complicated, unspecified whether persistent  Chronic diastolic congestive heart failure ([place])  Morbid obesity, unspecified obesity type ([place])  [name] Home Medications on Admission:  Current Outpatient Prescriptions:    acetaminophen (TYLENOL) 500 MG tablet, Take 1,000 mg by mouth every 6 (six) hours as needed for mild pain, fever or headache., Disp: , Rfl:    albuterol (PROVENTIL HFA;VENTOLIN HFA) 108 (90 BASE) MCG/ACT inhaler, Inhale 2 puffs into the lungs every 6 (six) hours as needed for wheezing or shortness of breath., Disp: 1 Inhaler, Rfl: 0   amLODipine (NORVASC) 10 MG tablet, Take 1 tablet (10 mg total) by mouth daily., Disp: 30 tablet, Rfl: 0   aspirin EC 81 MG tablet, Take 81 mg by mouth at bedtime., Disp: , Rfl:    budesonide (PULMICORT) 0.25 MG/2ML nebulizer solution, Take 2 mLs (0.25 mg total) by nebulization 2 (two) times daily., Disp: 60 mL, Rfl: 12   citalopram (CELEXA) 40 MG tablet, Take 40 mg by mouth daily., Disp: , Rfl:    guaiFENesin-dextromethorphan (ROBITUSSIN DM) 100-10 MG/5ML syrup, Take 5 mLs by mouth every 4 (four) hours as needed for cough., Disp: 118 mL, Rfl: 0   hydrALAZINE (APRESOLINE) 100 MG tablet, Take 1 tablet (100 mg total) by mouth every 8 (eight) hours., Disp: 60 tablet, Rfl: 0   insulin aspart (NOVOLOG FLEXPEN) 100 UNIT/ML FlexPen, Inject 0-12 Units into the skin 3 (three) times daily with meals as needed for high blood sugar. Pt uses as  needed per sliding scale., Disp: , Rfl:    Insulin Degludec (TRESIBA FLEXTOUCH) 100 UNIT/ML SOPN, Inject 45 Units into the skin at bedtime., Disp: 1 pen, Rfl: 2   ipratropium-albuterol (DUONEB) 0.5-2.5 (3) MG/3ML SOLN, Take 3 mLs by nebulization every 6 (six) hours as needed (for wheezing/shortness of breath)., Disp: , Rfl:    levofloxacin (LEVAQUIN) 750 MG tablet, Take 1 tablet (750 mg total) by mouth daily. (Patient not taking: Reported on 03/05/2016), Disp: 7 tablet, Rfl: 0   losartan (COZAAR) 50 MG tablet, Take 1 tablet (50 mg total) by mouth daily., Disp: 30 tablet, Rfl: 0   metFORMIN (GLUCOPHAGE) 500 MG tablet, Take 500 mg by mouth 2 (two) times daily with a meal., Disp: , Rfl:    metoprolol (LOPRESSOR) 50 MG tablet, Take 50 mg by mouth 2 (two) times daily., Disp: , Rfl:    potassium chloride (K-DUR,KLOR-CON) 10 MEQ tablet, Take 1 tablet (10 mEq total) by mouth 2 (two) times daily., Disp: 30 tablet, Rfl: 0   predniSONE (DELTASONE) 20 MG tablet, Take 2 tablets (40 mg total) by mouth daily. (Patient not taking: Reported on 03/05/2016), Disp: 6 tablet, Rfl: 0   simvastatin (ZOCOR) 40 MG tablet, Take 40 mg by mouth at bedtime., Disp: , Rfl:    torsemide (DEMADEX) 20 MG tablet, Take 2 tablets (40 mg total) by mouth daily., Disp: 30 tablet, Rfl: 0  Past Medical History: Past Medical History:  Diagnosis Date   Asthma    CHF (congestive heart failure) ([place])  COPD (chronic obstructive pulmonary disease) (HCC)    Diabetes mellitus without complication (HCC)    Hypertension    Iron deficiency anemia    Pericardial effusion    a. 11/2014 CT Chest: Mod effusion.   Poorly controlled diabetes mellitus ([place])    a. 11/2014 A1c 13.2.    Tobacco Use: History  Smoking Status   Never Smoker  Smokeless Tobacco   Never Used    Labs: Recent Review Flowsheet Data    Labs for ITP Cardiac and Pulmonary Rehab Latest Ref Rng & Units 12/15/2014 01/03/2015 08/09/2015 02/23/2016    Cholestrol 0 - 200 mg/dL - 232(H) - -   LDLCALC 0 - 99 mg/dL - 169(H) - -   HDL >40 mg/dL - 41 - -   Trlycerides <150 mg/dL - 112 - -   Hemoglobin A1c 4.0 - 6.0 % 13.2(H) - 10.7(H) -   HCO3 20.0 - 28.0 mmol/L - - 36.4(H) 38.0(H)   O2SAT % - - 80.3 84.3       ADL UCSD:     Pulmonary Assessment Scores    Row Name 01/15/16 1615         ADL UCSD   ADL Phase Entry     SOB Score total 118     Rest 3     Walk 5     Stairs 5     Bath 5     Dress 5     Shop 5        Pulmonary Function Assessment:     Pulmonary Function Assessment - 01/15/16 1614      Pulmonary Function Tests   FVC% 27 %   FEV1% 26 %   FEV1/FVC Ratio 79.61     Initial Spirometry Results   Comments Test date 01/15/16     Breath   Bilateral Breath Sounds Clear;Decreased   Shortness of Breath Yes;Limiting activity;Panic with Shortness of Breath;Fear of Shortness of Breath      Exercise Target Goals:    Exercise Program Goal: Individual exercise prescription set with THRR, safety & activity barriers. Participant demonstrates ability to understand and report RPE using BORG scale, to self-measure pulse accurately, and to acknowledge the importance of the exercise prescription.  Exercise Prescription Goal: Starting with aerobic activity 30 plus minutes a day, 3 days per week for initial exercise prescription. Provide home exercise prescription and guidelines that participant acknowledges understanding prior to discharge.  Activity Barriers & Risk Stratification:     Activity Barriers & Cardiac Risk Stratification - 01/15/16 1613      Activity Barriers & Cardiac Risk Stratification   Activity Barriers Joint Problems;Deconditioning;Muscular Weakness;Shortness of Breath;Assistive Device;Balance Concerns  also has a fear of falling, uses rollator at home and was encouraged to bring it to rehab.   Cardiac Risk Stratification Moderate      6 Minute Walk:     6 Minute Walk    Row Name 01/15/16 1637          6 Minute Walk   Phase Initial     Distance 140 feet     Walk Time 2.33 minutes     # of Rest Breaks 2  1:26, stopped at 2:56      MPH 0.68     METS 1.5     RPE 12     Perceived Dyspnea  5     VO2 Peak 1.99     Symptoms Yes (comment)     Comments hurting pain in legs and  back (8/10), anxiety about falling     Resting HR 83 bpm     Resting BP 166/60     Max Ex. HR 90 bpm     Max Ex. BP 152/54     2 Minute Post BP 134/60       Interval HR   Baseline HR 83     1 Minute HR 85     2 Minute HR 84     3 Minute HR 90     4 Minute HR 86     5 Minute HR 84     6 Minute HR 84     2 Minute Post HR 82     Interval Heart Rate? Yes       Interval Oxygen   Interval Oxygen? Yes     Baseline Oxygen Saturation % 96 %  was 88% on her pulsed 3L     Baseline Liters of Oxygen 4 L  switched to e-cylinder continuous flow     1 Minute Oxygen Saturation % 95 %  rest break 30-1:56     1 Minute Liters of Oxygen 4 L     2 Minute Oxygen Saturation % 94 %     2 Minute Liters of Oxygen 4 L     3 Minute Oxygen Saturation % 94 %  pt sat down at 2:56     3 Minute Liters of Oxygen 4 L     4 Minute Oxygen Saturation % 96 %     4 Minute Liters of Oxygen 4 L     5 Minute Oxygen Saturation % 98 %     5 Minute Liters of Oxygen 4 L     6 Minute Oxygen Saturation % 100 %     6 Minute Liters of Oxygen 4 L     2 Minute Post Oxygen Saturation % 100 %     2 Minute Post Liters of Oxygen 4 L        Initial Exercise Prescription:     Initial Exercise Prescription - 01/15/16 1600      Date of Initial Exercise RX and Referring Provider   Date 01/15/16   Referring Provider Ramashandran MD     Oxygen   Oxygen Continuous   Liters 4     Treadmill   MPH 0.5   Grade 0   Minutes 3  1 min intervals   METs 1.5     T5 Nustep   Level 1   Watts --  60-80 spm   Minutes 15   METs 1.5     Biostep-RELP   Level 1   Watts --  40-50 spm   Minutes 15   METs 1     Prescription Details    Frequency (times per week) 3   Duration Progress to 45 minutes of aerobic exercise without signs/symptoms of physical distress     Intensity   THRR 40-80% of Max Heartrate 120-157   Ratings of Perceived Exertion 11-15   Perceived Dyspnea 0-4     Progression   Progression Continue to progress workloads to maintain intensity without signs/symptoms of physical distress.     Resistance Training   Training Prescription Yes   Weight 2 lbs   Reps 10-12      Perform Capillary Blood Glucose checks as needed.  Exercise Prescription Changes:     Exercise Prescription Changes    Row Name 01/22/16 1300 01/30/16 1400  Exercise Review   Progression No Yes        Response to Exercise   Blood Pressure (Admit) 138/82 184/70      Blood Pressure (Exercise) 168/88 166/82      Blood Pressure (Exit) 138/80 148/68      Heart Rate (Admit) 89 bpm 81 bpm      Heart Rate (Exercise) 95 bpm 83 bpm      Heart Rate (Exit) 86 bpm 82 bpm      Oxygen Saturation (Admit) 96 % 90 %      Oxygen Saturation (Exercise) 93 % 95 %      Oxygen Saturation (Exit) 100 % 99 %      Rating of Perceived Exertion (Exercise) 15 11      Perceived Dyspnea (Exercise) 3 3      Symptoms  -- none      Duration Progress to 45 minutes of aerobic exercise without signs/symptoms of physical distress Progress to 45 minutes of aerobic exercise without signs/symptoms of physical distress      Intensity THRR unchanged THRR unchanged        Progression   Progression Continue to progress workloads to maintain intensity without signs/symptoms of physical distress. Continue to progress workloads to maintain intensity without signs/symptoms of physical distress.      Average METs  -- 1.45        Resistance Training   Training Prescription Yes Yes      Weight 2 2      Reps 10-12 10-12        Interval Training   Interval Training No No        Oxygen   Oxygen Continuous Continuous      Liters 4 4        Treadmill    MPH 0.5 0.5      Grade 0 0      Minutes 3 3      METs  -- 1.4        T5 Nustep   Level  -- 1      Minutes  -- 15      METs  -- 1.5        Biostep-RELP   Level 1 1      Minutes 17 15      METs  -- 1         Exercise Comments:     Exercise Comments    Row Name 01/15/16 1642 01/30/16 1421 02/14/16 1520       Exercise Comments [name] wants to get her independence back and to be able to get out more.  She has been spending a lot of time in her bed.  I talked with her and her husband about getting up and moving out to the living room to sit in the chair with her feet propped up to help start to increase her strength and stamina. [name]'s first day of exercise was 11/27.  She did great.  She has done well with her first three visits.  She has gotten out of the bed and into the chair in the living room.  We will continue to monitor her progression. [name] has been out since last review.        Discharge Exercise Prescription (Final Exercise Prescription Changes):     Exercise Prescription Changes - 01/30/16 1400      Exercise Review   Progression Yes     Response to Exercise   Blood Pressure (Admit) 184/70  Blood Pressure (Exercise) 166/82   Blood Pressure (Exit) 148/68   Heart Rate (Admit) 81 bpm   Heart Rate (Exercise) 83 bpm   Heart Rate (Exit) 82 bpm   Oxygen Saturation (Admit) 90 %   Oxygen Saturation (Exercise) 95 %   Oxygen Saturation (Exit) 99 %   Rating of Perceived Exertion (Exercise) 11   Perceived Dyspnea (Exercise) 3   Symptoms none   Duration Progress to 45 minutes of aerobic exercise without signs/symptoms of physical distress   Intensity THRR unchanged     Progression   Progression Continue to progress workloads to maintain intensity without signs/symptoms of physical distress.   Average METs 1.45     Resistance Training   Training Prescription Yes   Weight 2   Reps 10-12     Interval Training   Interval Training No     Oxygen   Oxygen Continuous    Liters 4     Treadmill   MPH 0.5   Grade 0   Minutes 3   METs 1.4     T5 Nustep   Level 1   Minutes 15   METs 1.5     Biostep-RELP   Level 1   Minutes 15   METs 1       Nutrition:  Target Goals: Understanding of nutrition guidelines, daily intake of sodium <1525m, cholesterol <2021m calories 30% from fat and 7% or less from saturated fats, daily to have 5 or more servings of fruits and vegetables.  Biometrics:    Nutrition Therapy Plan and Nutrition Goals:   Nutrition Discharge: Rate Your Plate Scores:   Psychosocial: Target Goals: Acknowledge presence or absence of depression, maximize coping skills, provide positive support system. Participant is able to verbalize types and ability to use techniques and skills needed for reducing stress and depression.  Initial Review & Psychosocial Screening:     Initial Psych Review & Screening - 01/15/16 1623      Family Dynamics   Good Support System? Yes   Comments [name] good support from her family and knows that she has to make changes to her life for a healthier life for her family and herself.     Barriers   Psychosocial barriers to participate in program The patient should benefit from training in stress management and relaxation.     Screening Interventions   Interventions Encouraged to exercise;Program counselor consult      Quality of Life Scores:     Quality of Life - 01/15/16 1625      Quality of Life Scores   Health/Function Pre 20.75 %   Socioeconomic Pre 20.43 %   Psych/Spiritual Pre 21 %   Family Pre 20.6 %   GLOBAL Pre 20.71 %      PHQ-9: Recent Review Flowsheet Data    [facility] 01/15/2016 10/02/2015 08/30/2015 05/02/2015   Decreased Interest 2 0 0 2   Down, Depressed, Hopeless 1 0 0 1   PHQ - 2 Score 3 0 0 3   Altered sleeping 3 - - 2   Tired, decreased energy 3 - - 3   Change in appetite 2 - - 1   Feeling bad or failure about yourself  2 - - 2   Trouble  concentrating 2 - - 2   Moving slowly or fidgety/restless 1 - - 1   Suicidal thoughts 1 - - 1   PHQ-9 Score 17 - - 15   Difficult doing work/chores Very  difficult - - Somewhat difficult      Psychosocial Evaluation and Intervention:     Psychosocial Evaluation - 01/24/16 1223      Psychosocial Evaluation & Interventions   Interventions Stress management education;Relaxation education;Encouraged to exercise with the program and follow exercise prescription   Comments Counselor met with [name]. [employer] ([name]. S) today for initial psychosocial evaluation.  She is a 46 year old who has multiple health issues including COPD; Type 2 Diabetes; CHF and Asthma.  She has a strong support system with a spouse of 15 years and (4) adult children - (2) of which live either in the home or close by.  [name] reports sleeping better since she went on Oxygen 6 months ago.  Her appetite is up and down - but mostly good.    She reports a history of anxiety and has been on a variety of medications for this and is currently on Celexa 40 mg.  Counselor discussed with [name]. S her PHQ-9 score of "17" and of this indicating moderately severe depression.  She denies SI/HI but does admit to a lot of stress in her life currently - particularly since she is unable to work and her spouse is on SSI so finances are extremely limited.  She is concerned about having the means to put gas in her car for these classes 3x/week.  [name] has goals to "get back up and moving around" since she admits to being bed-ridden for the past year or so.  counselor encouraged her to come consistently - if possible for the best results.  And to speak with the nurses about transportation options that might be available.  Counselor also mentioned if mood does not improve with consistent exercise in the next few weeks, [name] may need to speak with her Dr. about increasing her medications for depression.  Counselor and staff will continue to follow with her throughout  the course of this program.     Continued Psychosocial Services Needed Yes  [name] may need her Rx for depression increased if exercise does not help over the next few weeks.  She agreed.  Also, she may have barriers to attend with limited $$ for gasoline.        Psychosocial Re-Evaluation:  Education: Education Goals: Education classes will be provided on a weekly basis, covering required topics. Participant will state understanding/return demonstration of topics presented.  Learning Barriers/Preferences:     Learning Barriers/Preferences - 01/15/16 1613      Learning Barriers/Preferences   Learning Barriers None   Learning Preferences None      Education Topics: Initial Evaluation Education: - Verbal, written and demonstration of respiratory meds, RPE/PD scales, oximetry and breathing techniques. Instruction on use of nebulizers and MDIs: cleaning and proper use, rinsing mouth with steroid doses and importance of monitoring MDI activations. Flowsheet Row Pulmonary Rehab from 01/29/2016 in [facility] Cardiac and Pulmonary Rehab  Date  01/15/16  Educator  LB  Instruction Review Code  2- meets goals/outcomes      General Nutrition Guidelines/Fats and Fiber: -Group instruction provided by verbal, written material, models and posters to present the general guidelines for heart healthy nutrition. Gives an explanation and review of dietary fats and fiber.   Controlling Sodium/Reading Food Labels: -Group verbal and written material supporting the discussion of sodium use in heart healthy nutrition. Review and explanation with models, verbal and written materials for utilization of the food label.   Exercise Physiology & Risk Factors: - Group  verbal and written instruction with models to review the exercise physiology of the cardiovascular system and associated critical values. Details cardiovascular disease risk factors and the goals associated with each risk factor.   Aerobic Exercise  & Resistance Training: - Gives group verbal and written discussion on the health impact of inactivity. On the components of aerobic and resistive training programs and the benefits of this training and how to safely progress through these programs.   Flexibility, Balance, General Exercise Guidelines: - Provides group verbal and written instruction on the benefits of flexibility and balance training programs. Provides general exercise guidelines with specific guidelines to those with heart or lung disease. Demonstration and skill practice provided.   Stress Management: - Provides group verbal and written instruction about the health risks of elevated stress, cause of high stress, and healthy ways to reduce stress.   Depression: - Provides group verbal and written instruction on the correlation between heart/lung disease and depressed mood, treatment options, and the stigmas associated with seeking treatment. Flowsheet Row Pulmonary Rehab from 01/29/2016 in [facility] Cardiac and Pulmonary Rehab  Date  01/29/16  Educator  [facility]  Instruction Review Code  2- meets goals/outcomes      Exercise & Equipment Safety: - Individual verbal instruction and demonstration of equipment use and safety with use of the equipment.   Infection Prevention: - Provides verbal and written material to individual with discussion of infection control including proper hand washing and proper equipment cleaning during exercise session. Flowsheet Row Pulmonary Rehab from 01/29/2016 in [facility] Cardiac and Pulmonary Rehab  Date  01/15/16  Educator  LB  Instruction Review Code  2- meets goals/outcomes      Falls Prevention: - Provides verbal and written material to individual with discussion of falls prevention and safety. Flowsheet Row Pulmonary Rehab from 01/29/2016 in [facility] Cardiac and Pulmonary Rehab  Date  01/15/16  Educator  LB  Instruction Review Code  2- meets goals/outcomes      Diabetes: - Individual verbal and  written instruction to review signs/symptoms of diabetes, desired ranges of glucose level fasting, after meals and with exercise. Advice that pre and post exercise glucose checks will be done for 3 sessions at entry of program.   Chronic Lung Diseases: - Group verbal and written instruction to review new updates, new respiratory medications, new advancements in procedures and treatments. Provide informative websites and "800" numbers of self-education. Flowsheet Row Pulmonary Rehab from 01/29/2016 in [facility] Cardiac and Pulmonary Rehab  Date  01/24/16  Educator  LB  Instruction Review Code  2- meets goals/outcomes      Lung Procedures: - Group verbal and written instruction to describe testing methods done to diagnose lung disease. Review the outcome of test results. Describe the treatment choices: Pulmonary Function Tests, ABGs and oximetry.   Energy Conservation: - Provide group verbal and written instruction for methods to conserve energy, plan and organize activities. Instruct on pacing techniques, use of adaptive equipment and posture/positioning to relieve shortness of breath.   Triggers: - Group verbal and written instruction to review types of environmental controls: home humidity, furnaces, filters, dust mite/pet prevention, HEPA vacuums. To discuss weather changes, air quality and the benefits of nasal washing.   Exacerbations: - Group verbal and written instruction to provide: warning signs, infection symptoms, calling MD promptly, preventive modes, and value of vaccinations. Review: effective airway clearance, coughing and/or vibration techniques. Create an Sports administrator.   Oxygen: - Individual and group verbal and written instruction on oxygen therapy. Includes  supplement oxygen, available portable oxygen systems, continuous and intermittent flow rates, oxygen safety, concentrators, and Medicare reimbursement for oxygen. Flowsheet Row Pulmonary Rehab from 01/29/2016 in [facility]  Cardiac and Pulmonary Rehab  Date  01/15/16  Educator  LB  Instruction Review Code  2- meets goals/outcomes      Respiratory Medications: - Group verbal and written instruction to review medications for lung disease. Drug class, frequency, complications, importance of spacers, rinsing mouth after steroid MDI's, and proper cleaning methods for nebulizers. Flowsheet Row Pulmonary Rehab from 01/29/2016 in [facility] Cardiac and Pulmonary Rehab  Date  01/15/16  Educator  LB  Instruction Review Code  2- meets goals/outcomes      AED/CPR: - Group verbal and written instruction with the use of models to demonstrate the basic use of the AED with the basic ABC's of resuscitation.   Breathing Retraining: - Provides individuals verbal and written instruction on purpose, frequency, and proper technique of diaphragmatic breathing and pursed-lipped breathing. Applies individual practice skills. Flowsheet Row Pulmonary Rehab from 01/29/2016 in [facility] Cardiac and Pulmonary Rehab  Date  01/15/16  Educator  LB  Instruction Review Code  2- meets goals/outcomes      Anatomy and Physiology of the Lungs: - Group verbal and written instruction with the use of models to provide basic lung anatomy and physiology related to function, structure and complications of lung disease.   Heart Failure: - Group verbal and written instruction on the basics of heart failure: signs/symptoms, treatments, explanation of ejection fraction, enlarged heart and cardiomyopathy.   Sleep Apnea: - Individual verbal and written instruction to review Obstructive Sleep Apnea. Review of risk factors, methods for diagnosing and types of masks and machines for OSA.   Anxiety: - Provides group, verbal and written instruction on the correlation between heart/lung disease and anxiety, treatment options, and management of anxiety.   Relaxation: - Provides group, verbal and written instruction about the benefits of relaxation for patients  with heart/lung disease. Also provides patients with examples of relaxation techniques.   Knowledge Questionnaire Score:     Knowledge Questionnaire Score - 01/15/16 1613      Knowledge Questionnaire Score   Pre Score 10/10       Core Components/Risk Factors/Patient Goals at Admission:     Personal Goals and Risk Factors at Admission - 01/15/16 1619      Core Components/Risk Factors/Patient Goals on Admission    Weight Management Yes;Obesity;Weight Loss   Intervention Weight Management: Develop a combined nutrition and exercise program designed to reach desired caloric intake, while maintaining appropriate intake of nutrient and fiber, sodium and fats, and appropriate energy expenditure required for the weight goal.;Weight Management: Provide education and appropriate resources to help participant work on and attain dietary goals.;Weight Management/Obesity: Establish reasonable short term and long term weight goals.;Obesity: Provide education and appropriate resources to help participant work on and attain dietary goals.   Admit Weight 358 lb (162.4 kg)   Goal Weight: Short Term 355 lb (161 kg)   Goal Weight: Long Term 300 lb (136.1 kg)   Expected Outcomes Long Term: Adherence to nutrition and physical activity/exercise program aimed toward attainment of established weight goal;Weight Maintenance: Understanding of the daily nutrition guidelines, which includes 25-35% calories from fat, 7% or less cal from saturated fats, less than 265m cholesterol, less than 1.5gm of sodium, & 5 or more servings of fruits and vegetables daily;Weight Loss: Understanding of general recommendations for a balanced deficit meal plan, which promotes 1-2 lb weight loss per  week and includes a negative energy balance of [phone] kcal/d;Understanding recommendations for meals to include 15-35% energy as protein, 25-35% energy from fat, 35-60% energy from carbohydrates, less than 293m of dietary cholesterol, 20-35 gm  of total fiber daily;Understanding of distribution of calorie intake throughout the day with the consumption of 4-5 meals/snacks   Sedentary Yes   Intervention Provide advice, education, support and counseling about physical activity/exercise needs.;Develop an individualized exercise prescription for aerobic and resistive training based on initial evaluation findings, risk stratification, comorbidities and participant's personal goals.   Expected Outcomes Achievement of increased cardiorespiratory fitness and enhanced flexibility, muscular endurance and strength shown through measurements of functional capacity and personal statement of participant.   Increase Strength and Stamina Yes   Intervention Provide advice, education, support and counseling about physical activity/exercise needs.;Develop an individualized exercise prescription for aerobic and resistive training based on initial evaluation findings, risk stratification, comorbidities and participant's personal goals.   Expected Outcomes Achievement of increased cardiorespiratory fitness and enhanced flexibility, muscular endurance and strength shown through measurements of functional capacity and personal statement of participant.   Improve shortness of breath with ADL's Yes   Intervention Provide education, individualized exercise plan and daily activity instruction to help decrease symptoms of SOB with activities of daily living.   Expected Outcomes Short Term: Achieves a reduction of symptoms when performing activities of daily living.   Develop more efficient breathing techniques such as purse lipped breathing and diaphragmatic breathing; and practicing self-pacing with activity Yes   Intervention Provide education, demonstration and support about specific breathing techniuqes utilized for more efficient breathing. Include techniques such as pursed lipped breathing, diaphragmatic breathing and self-pacing activity.   Expected Outcomes Short  Term: Participant will be able to demonstrate and use breathing techniques as needed throughout daily activities.   Increase knowledge of respiratory medications and ability to use respiratory devices properly  Yes   Intervention Provide education and demonstration as needed of appropriate use of medications, inhalers, and oxygen therapy.   Expected Outcomes Short Term: Achieves understanding of medications use. Understands that oxygen is a medication prescribed by physician. Demonstrates appropriate use of inhaler and oxygen therapy.   Diabetes Yes   Intervention Provide education about signs/symptoms and action to take for hypo/hyperglycemia.;Provide education about proper nutrition, including hydration, and aerobic/resistive exercise prescription along with prescribed medications to achieve blood glucose in normal ranges: Fasting glucose 65-99 mg/dL   Expected Outcomes Short Term: Participant verbalizes understanding of the signs/symptoms and immediate care of hyper/hypoglycemia, proper foot care and importance of medication, aerobic/resistive exercise and nutrition plan for blood glucose control.;Long Term: Attainment of HbA1C < 7%.   Heart Failure Yes   Intervention Provide a combined exercise and nutrition program that is supplemented with education, support and counseling about heart failure. Directed toward relieving symptoms such as shortness of breath, decreased exercise tolerance, and extremity edema.   Expected Outcomes Improve functional capacity of life;Short term: Attendance in program 2-3 days a week with increased exercise capacity. Reported lower sodium intake. Reported increased fruit and vegetable intake. Reports medication compliance.;Short term: Daily weights obtained and reported for increase. Utilizing diuretic protocols set by physician.;Long term: Adoption of self-care skills and reduction of barriers for early signs and symptoms recognition and intervention leading to self-care  maintenance.   Hypertension Yes   Intervention Provide education on lifestyle modifcations including regular physical activity/exercise, weight management, moderate sodium restriction and increased consumption of fresh fruit, vegetables, and low fat dairy, alcohol moderation, and smoking cessation.;Monitor prescription  use compliance.   Expected Outcomes Short Term: Continued assessment and intervention until BP is < 140/84m HG in hypertensive participants. < 130/823mHG in hypertensive participants with diabetes, heart failure or chronic kidney disease.;Long Term: Maintenance of blood pressure at goal levels.   Lipids Yes   Intervention Provide education and support for participant on nutrition & aerobic/resistive exercise along with prescribed medications to achieve LDL <7036mHDL >58m41m Expected Outcomes Short Term: Participant states understanding of desired cholesterol values and is compliant with medications prescribed. Participant is following exercise prescription and nutrition guidelines.;Long Term: Cholesterol controlled with medications as prescribed, with individualized exercise RX and with personalized nutrition plan. Value goals: LDL < 70mg25mL > 40 mg.      Core Components/Risk Factors/Patient Goals Review:      Goals and Risk Factor Review    Row Name 02/07/16 1515             Core Components/Risk Factors/Patient Goals Review   Personal Goals Review Sedentary;Increase Strength and Stamina;Develop more efficient breathing techniques such as purse lipped breathing and diaphragmatic breathing and practicing self-pacing with activity.       Review [name] 3 sessions of pulmonary rehab.  She has obtained her exercise goals on the BioStep and T5 and performed interval training on the treadmill. Her PLB technique is good and her vital signs are acceptable. She is compliant with her diabetes, lipids, and blood pressure medications.       Expected Outcomes Continue  exercising in [facility] to increase stamina and strength.          Core Components/Risk Factors/Patient Goals at Discharge (Final Review):      Goals and Risk Factor Review - 02/07/16 1515      Core Components/Risk Factors/Patient Goals Review   Personal Goals Review Sedentary;Increase Strength and Stamina;Develop more efficient breathing techniques such as purse lipped breathing and diaphragmatic breathing and practicing self-pacing with activity.   Review [name] 3 sessions of pulmonary rehab.  She has obtained her exercise goals on the BioStep and T5 and performed interval training on the treadmill. Her PLB technique is good and her vital signs are acceptable. She is compliant with her diabetes, lipids, and blood pressure medications.   Expected Outcomes Continue exercising in [facility] to increase stamina and strength.      ITP Comments:     ITP Comments    Row Name 02/12/16 1541 03/04/16 1242         ITP Comments Called [name] last attended 01/29/16. [name] ShielVantolbeen short of breath and has had a stomach virus. She is making an appointment with Dr RamacMontel [facility]hopes [facility] CarlaKassidiecalled to see if she was planning to return to [facility]. She stated that she does want to return but is not sure when she will be able to come back. She has been in the hospital with pneumonia since she last attended and was given an antibiotic and is now home. She stated that she has been getting out of bed, walking around the house with her walker, and sitting in her chair. Will follow up with her again in a few weeks to see if she know when she will return.          Comments:  30 day note review

## 2016-03-13 ENCOUNTER — Ambulatory Visit: Payer: Medicaid Other | Admitting: Family

## 2016-03-23 ENCOUNTER — Emergency Department: Payer: Medicaid Other

## 2016-03-23 ENCOUNTER — Emergency Department
Admission: EM | Admit: 2016-03-23 | Discharge: 2016-03-23 | Disposition: A | Discharge status: Home / Self Care | Payer: Medicaid Other | Attending: Emergency Medicine | Admitting: Emergency Medicine

## 2016-03-23 ENCOUNTER — Encounter: Payer: Self-pay | Admitting: Emergency Medicine

## 2016-03-23 ENCOUNTER — Other Ambulatory Visit: Payer: Self-pay

## 2016-03-23 DIAGNOSIS — I110 Hypertensive heart disease with heart failure: Secondary | ICD-10-CM | POA: Diagnosis not present

## 2016-03-23 DIAGNOSIS — J810 Acute pulmonary edema: Secondary | ICD-10-CM | POA: Insufficient documentation

## 2016-03-23 DIAGNOSIS — R0602 Shortness of breath: Secondary | ICD-10-CM | POA: Diagnosis present

## 2016-03-23 DIAGNOSIS — Z7982 Long term (current) use of aspirin: Secondary | ICD-10-CM | POA: Diagnosis not present

## 2016-03-23 DIAGNOSIS — I509 Heart failure, unspecified: Secondary | ICD-10-CM | POA: Insufficient documentation

## 2016-03-23 DIAGNOSIS — Z79899 Other long term (current) drug therapy: Secondary | ICD-10-CM | POA: Diagnosis not present

## 2016-03-23 DIAGNOSIS — Z794 Long term (current) use of insulin: Secondary | ICD-10-CM | POA: Insufficient documentation

## 2016-03-23 DIAGNOSIS — J45909 Unspecified asthma, uncomplicated: Secondary | ICD-10-CM | POA: Diagnosis not present

## 2016-03-23 DIAGNOSIS — E119 Type 2 diabetes mellitus without complications: Secondary | ICD-10-CM | POA: Diagnosis not present

## 2016-03-23 LAB — COMPREHENSIVE METABOLIC PANEL
ALT: 12 U/L — ABNORMAL LOW (ref 14–54)
AST: 19 U/L (ref 15–41)
Albumin: 2.5 g/dL — ABNORMAL LOW (ref 3.5–5.0)
Alkaline Phosphatase: 118 U/L (ref 38–126)
Anion gap: 6 (ref 5–15)
BUN: 18 mg/dL (ref 6–20)
CHLORIDE: 95 mmol/L — AB (ref 101–111)
CO2: 34 mmol/L — ABNORMAL HIGH (ref 22–32)
CREATININE: 1.88 mg/dL — AB (ref 0.44–1.00)
Calcium: 7.9 mg/dL — ABNORMAL LOW (ref 8.9–10.3)
GFR, EST AFRICAN AMERICAN: 36 mL/min — AB (ref >60)
GFR, EST NON AFRICAN AMERICAN: 31 mL/min — AB (ref >60)
Glucose, Bld: 285 mg/dL — ABNORMAL HIGH (ref 65–99)
POTASSIUM: 4.7 mmol/L (ref 3.5–5.1)
Sodium: 135 mmol/L (ref 135–145)
Total Bilirubin: 0.7 mg/dL (ref 0.3–1.2)
Total Protein: 6.1 g/dL — ABNORMAL LOW (ref 6.5–8.1)

## 2016-03-23 LAB — BRAIN NATRIURETIC PEPTIDE: B NATRIURETIC PEPTIDE 5: 337 pg/mL — AB (ref 0.0–100.0)

## 2016-03-23 LAB — CBC WITH DIFFERENTIAL/PLATELET
Basophils Absolute: 0 10*3/uL (ref 0–0.1)
Basophils Relative: 1 %
EOS ABS: 0.1 10*3/uL (ref 0–0.7)
EOS PCT: 1 %
HCT: 31.9 % — ABNORMAL LOW (ref 35.0–47.0)
HEMOGLOBIN: 10.1 g/dL — AB (ref 12.0–16.0)
LYMPHS ABS: 0.5 10*3/uL — AB (ref 1.0–3.6)
LYMPHS PCT: 8 %
MCH: 27.4 pg (ref 26.0–34.0)
MCHC: 31.7 g/dL — ABNORMAL LOW (ref 32.0–36.0)
MCV: 86.4 fL (ref 80.0–100.0)
Monocytes Absolute: 0.4 10*3/uL (ref 0.2–0.9)
Monocytes Relative: 6 %
NEUTROS PCT: 84 %
Neutro Abs: 5.4 10*3/uL (ref 1.4–6.5)
PLATELETS: 252 10*3/uL (ref 150–440)
RBC: 3.69 MIL/uL — AB (ref 3.80–5.20)
RDW: 13.6 % (ref 11.5–14.5)
WBC: 6.4 10*3/uL (ref 3.6–11.0)

## 2016-03-23 LAB — TROPONIN I

## 2016-03-23 IMAGING — CR DG CHEST 2V
2 series · 2 of 2 positions shown · non-contrast
Comparison: Chest radiograph [DATE].

CLINICAL DATA: Patient with shortness of breath.  Productive cough.

EXAM:
CHEST  2 VIEW

[chest lat]
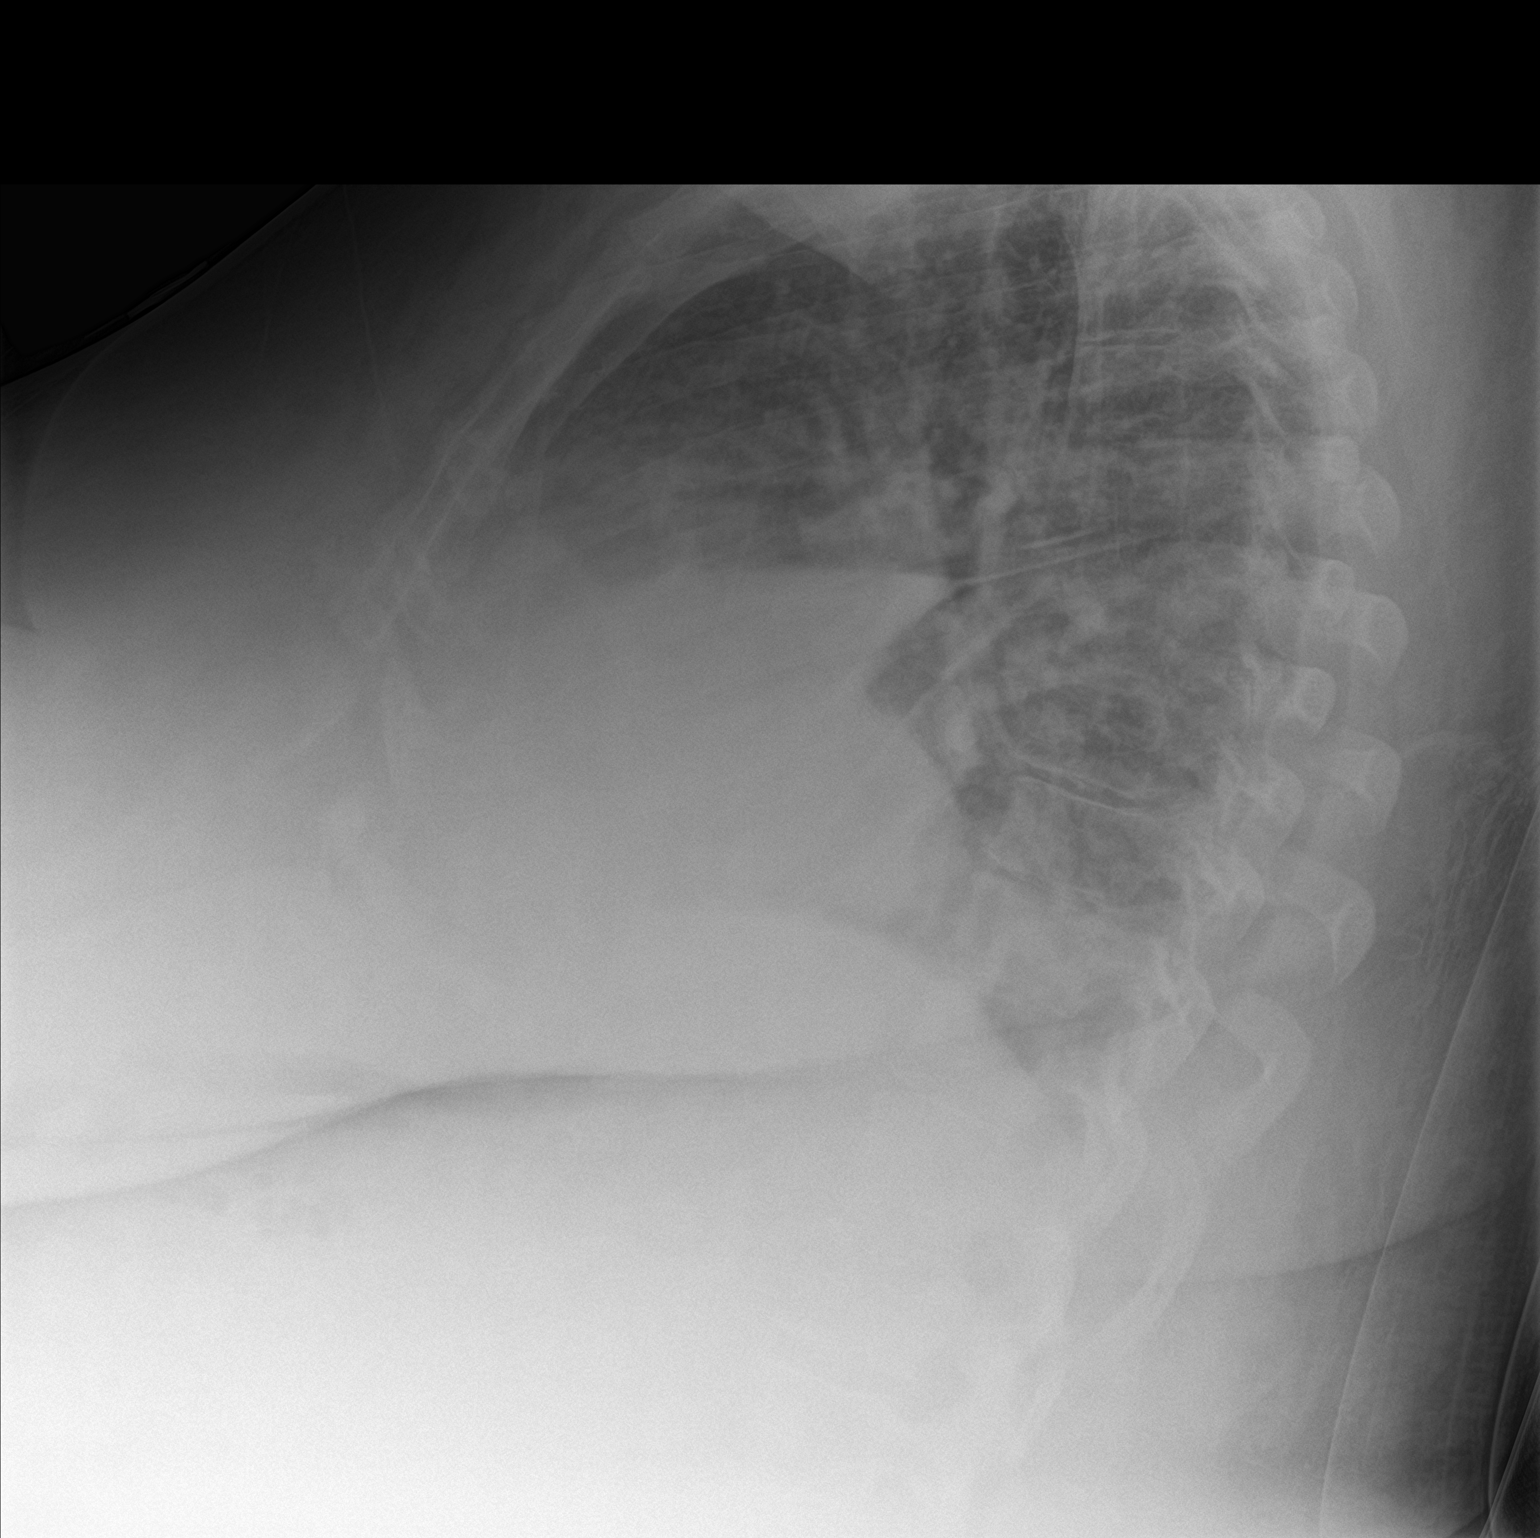

[chest ap]
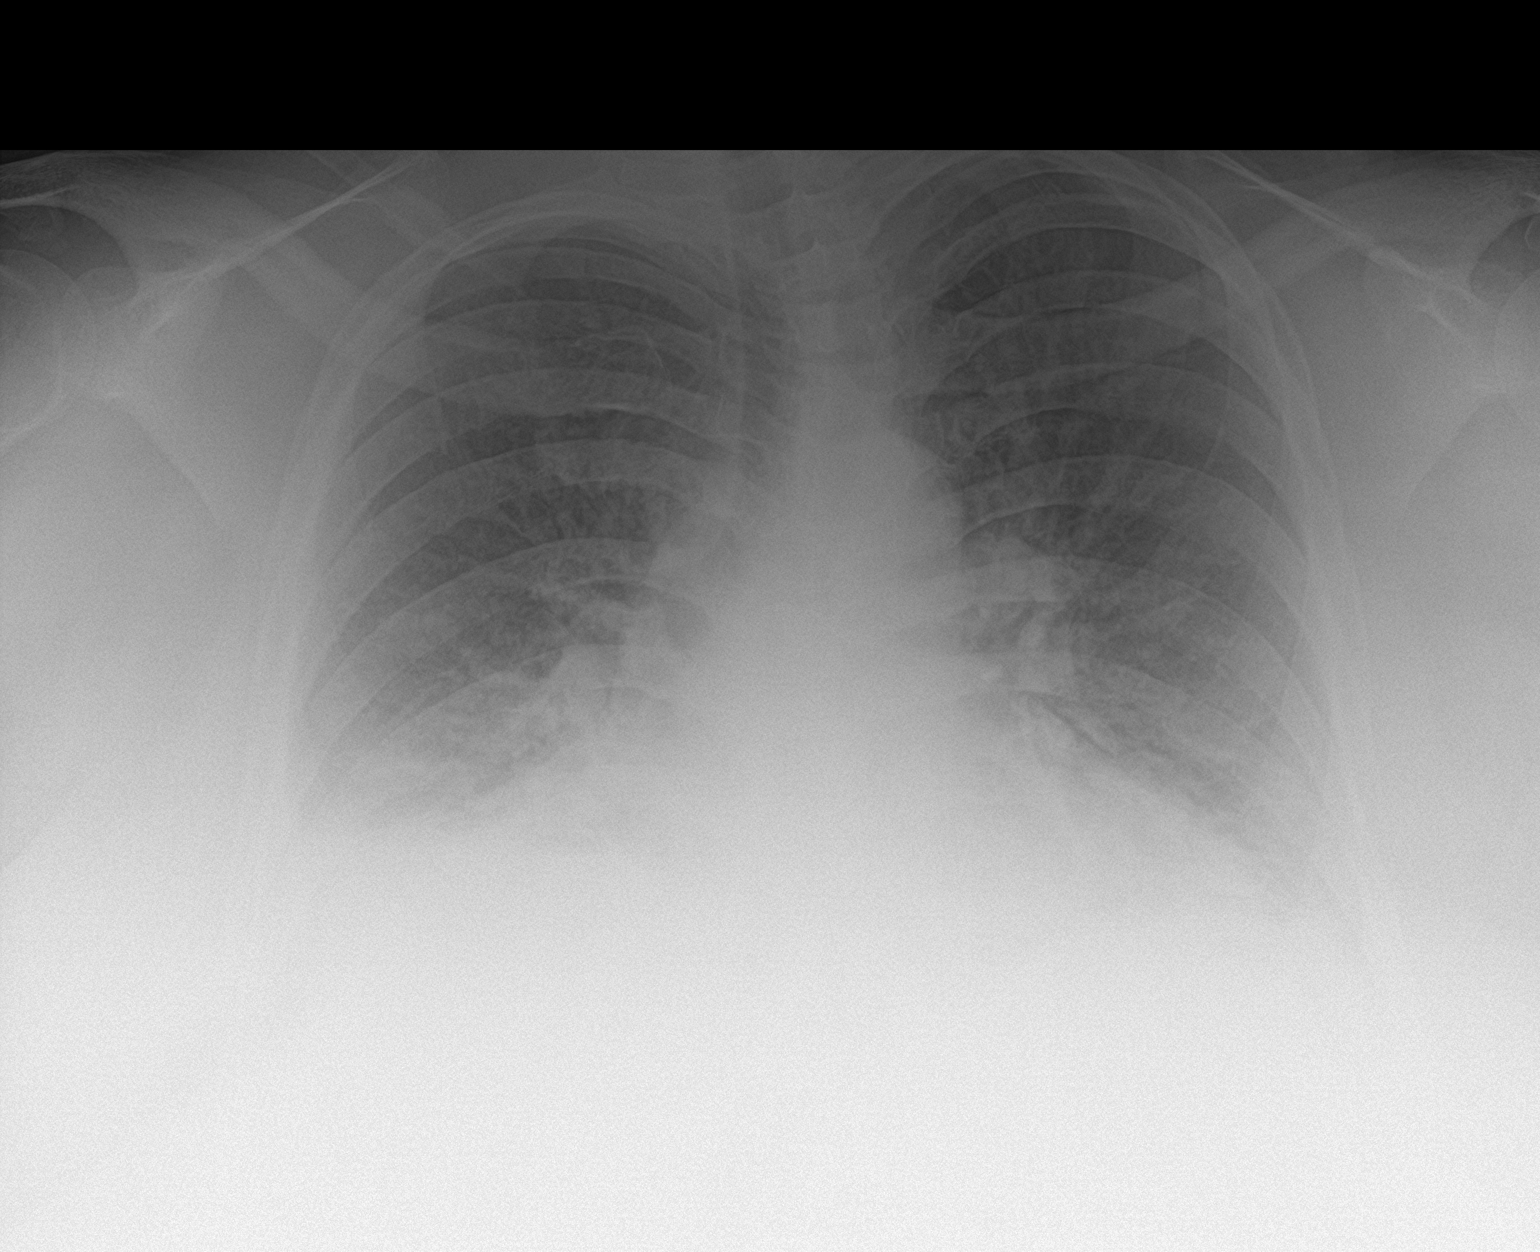

[2 of 2 positions shown; findings below may reference images not displayed]

FINDINGS: Cardiomegaly. Pulmonary vascular redistribution and bilateral
interstitial pulmonary opacities. More focal opacity right mid lung.
Probable small bilateral pleural effusions. Thoracic spine
degenerative changes.
IMPRESSION: Cardiomegaly, pulmonary vascular redistribution and findings
suggestive of pulmonary edema. More focal opacity right mid lung.
Pneumonia not excluded.

## 2016-03-23 MED ORDER — AZITHROMYCIN 250 MG PO TABS: ORAL_TABLET | ORAL | 0 refills | Status: DC

## 2016-03-23 MED ORDER — NITROGLYCERIN 2 % TD OINT
1.0000 [in_us] | TOPICAL_OINTMENT | Freq: Once | TRANSDERMAL | Status: AC
  Administered 2016-03-23: 1 [in_us] via TOPICAL
  Filled 2016-03-23: qty 1

## 2016-03-23 MED ORDER — ACETAMINOPHEN 325 MG PO TABS
650.0000 mg | ORAL_TABLET | Freq: Once | ORAL | Status: AC
  Administered 2016-03-23: 650 mg via ORAL
  Filled 2016-03-23: qty 2

## 2016-03-23 MED ORDER — FUROSEMIDE 10 MG/ML IJ SOLN
40.0000 mg | Freq: Once | INTRAMUSCULAR | Status: AC
  Administered 2016-03-23: 40 mg via INTRAVENOUS
  Filled 2016-03-23: qty 4

## 2016-03-23 MED ORDER — LORAZEPAM 1 MG PO TABS
1.0000 mg | ORAL_TABLET | Freq: Once | ORAL | Status: AC
  Administered 2016-03-23: 1 mg via ORAL
  Filled 2016-03-23: qty 1

## 2016-03-23 NOTE — ED Notes (Signed)

## 2016-03-23 NOTE — ED Triage Notes (Addendum)
Pt presents to ED via [facility] c/o shortness of breath. Hx COPD, asthma, panic attacks. O2 sat 99% on chronic 3L O2 per EMS, 89% on 3L upon arrival. +wet cough. BP elevated 200s/100s per EMS. Pt states she has been out of her BP meds x4-5 days.

## 2016-03-23 NOTE — ED Provider Notes (Signed)
Time Seen: Approximately 1828  I have reviewed the triage notes  Chief Complaint: Shortness of Breath   History of Present Illness: [name] is a 46 y.o. female *who has a history of asthma, COPD, congestive heart failure, etc. Patient is on home oxygen therapy normally 3 L. She states that she has had some increasing shortness of breath weight gain and increased peripheral edema over the last 4 days. She states she feels anxious and does not have any more of her anxiety medication. She states she's also been out of some of her other medication has been trying to take it though has an insurance struggled with the pharmacy. She states now her medications have been filled and waiting for her at this time. She denies any chest discomfort and points a little bit to the epigastric area and states she occasionally feels like she gets "" food stuck "".   Past Medical History:  Diagnosis Date  . Asthma   . CHF (congestive heart failure) ([place])   . COPD (chronic obstructive pulmonary disease) ([place])   . Diabetes mellitus without complication ([place])   . Hypertension   . Iron deficiency anemia   . Pericardial effusion    a. 11/2014 CT Chest: Mod effusion.  [name] Poorly controlled diabetes mellitus ([place])    a. 11/2014 A1c 13.2.    Patient Active Problem List   Diagnosis Date Noted  . Anxiety 10/02/2015  . Asthma 08/30/2015  . Poorly controlled type 2 diabetes mellitus ([place]) 03/13/2015  . Iron deficiency anemia 02/17/2015  . Morbid obesity due to excess calories ([place])   . Shortness of breath   . Essential hypertension   . CHF (congestive heart failure) ([place]) 01/01/2015  . Hypertensive heart disease with congestive heart failure ([place]) 12/18/2014  . Diabetes ([place]) 12/15/2014    Past Surgical History:  Procedure Laterality Date  . CESAREAN SECTION      Past Surgical History:  Procedure Laterality Date  . CESAREAN SECTION      Current Outpatient Rx  . Order #: [id number] Class:  Historical Med  . Order #: [id number] Class: Print  . Order #: [id number] Class: Print  . Order #: [id number] Class: Historical Med  . Order #: [id number] Class: Print  . Order #: [id number] Class: Historical Med  . Order #: [id number] Class: Print  . Order #: [id number] Class: Print  . Order #: [id number] Class: Historical Med  . Order #: [id number] Class: Print  . Order #: [id number] Class: Historical Med  . Order #: [id number] Class: Print  . Order #: [id number] Class: Print  . Order #: [id number] Class: Historical Med  . Order #: [id number] Class: Historical Med  . Order #: [id number] Class: Print  . Order #: [id number] Class: Print  . Order #: [id number] Class: Historical Med  . Order #: [id number] Class: Print    Allergies:  Feraheme [ferumoxytol] and Sulfa antibiotics  Family History: Family History  Problem Relation Age of Onset  . Diabetes Mother   . Hypertension Mother   . Hypertension Father   . Diabetes Father   . Hypertension    . Diabetes    . Breast cancer Maternal Aunt 45  . Breast cancer Maternal Aunt 50    Social History: Social History  Substance Use Topics  . Smoking status: Never Smoker  . Smokeless tobacco: Never Used  . Alcohol use 0.0 oz/week     Comment: occ     Review of Systems:   10 point review of systems was performed and was otherwise negative:  Constitutional: No fever Eyes: No visual disturbances  ENT: No sore throat, ear pain Cardiac: No chest pain. Cough is been productive of clear Respiratory: Increasing shortness of breath especially with any kind of activity Abdomen: Mild epigastric abdominal pain with some nausea no vomiting or diarrhea Endocrine: [name] had weight gain without night sweats Extremities: Chronic peripheral edema worse than normal Skin: No rashes, easy bruising Neurologic: No focal weakness, trouble with speech or swollowing Urologic: No dysuria, Hematuria, or urinary frequency   Physical Exam:  ED Triage Vitals  Enc Vitals Group      BP 03/23/16 1839 (!) 177/67     Pulse Rate 03/23/16 1839 99     Resp 03/23/16 1839 (!) 24     Temp 03/23/16 1839 98.3 F (36.8 C)     Temp Source 03/23/16 1839 Oral     SpO2 03/23/16 1839 (!) 89 %     Weight 03/23/16 1840 (!) 380 lb (172.4 kg)     Height 03/23/16 1840 5\' 4"  (1.626 m)     Head Circumference --      Peak Flow --      Pain Score 03/23/16 1841 0     Pain Loc --      Pain Edu? --      Excl. in [place]? --     General: Awake , Alert , and Oriented times 3; GCS 15 Mildly tachypneic but otherwise no signs of respiratory distress Head: Normal cephalic , atraumatic Eyes: Pupils equal , round, reactive to light Nose/Throat: No nasal drainage, patent upper airway without erythema or exudate.  Neck: Supple, Full range of motion, No anterior adenopathy or palpable thyroid masses Lungs: Some rales auscultated and both lung fields especially at the bases without wheezes or rhonchi Heart: Regular rate, regular rhythm without murmurs , gallops , or rubs Abdomen morbidly obese: Soft, non tender without rebound, guarding , or rigidity; bowel sounds positive and symmetric in all 4 quadrants. No organomegaly .        Extremities: Bilateral circumferential peripheral edema Neurologic: normal ambulation, Motor symmetric without deficits, sensory intact Skin: warm, dry, no rashes   Labs:   All laboratory work was reviewed including any pertinent negatives or positives listed below:  [place] PANEL  CBC WITH DIFFERENTIAL/PLATELET  TROPONIN I  Laboratory work was within normal limits for the patient. She does have some history of renal insufficiency with her pulmonary edema. Troponin was negative.  EKG: ED ECG REPORT I, Daymon Larsen, the attending physician, personally viewed and interpreted this ECG.  Date: 03/23/2016 EKG Time: 1848Rate: 97 Rhythm: normal sinus rhythm QRS Axis: normal Intervals: normal ST/T Wave  abnormalities: normal Conduction Disturbances: none Narrative Interpretation: unremarkable Poor R-wave progression in the septal leads. No acute ischemic changes   Radiology:  "Dg Chest 2 View  Result Date: 03/23/2016 CLINICAL DATA:  Patient with shortness of breath.  Productive cough. EXAM: CHEST  2 VIEW COMPARISON:  Chest radiograph 02/23/2016. FINDINGS: Cardiomegaly. Pulmonary vascular redistribution and bilateral interstitial pulmonary opacities. More focal opacity right mid lung. Probable small bilateral pleural effusions. Thoracic spine degenerative changes. IMPRESSION: Cardiomegaly, pulmonary vascular redistribution and findings suggestive of pulmonary edema. More focal opacity right mid lung. Pneumonia not excluded. Electronically Signed   By: Lovey Newcomer M.D.   On: 03/23/2016 19:23   Dg Chest 2 View  Result Date: 02/23/2016 CLINICAL DATA:  Chronic shortness of breath, productive cough. EXAM: CHEST  2 VIEW COMPARISON:  Radiographs of August 09, 2015. FINDINGS: Stable cardiomegaly.  No pneumothorax is noted. Mild central pulmonary vascular congestion is noted. New right basilar opacity is noted concerning for atelectasis or infiltrate with possible associated pleural effusion. Bony thorax is unremarkable. IMPRESSION: New right basilar opacity concerning for pneumonia or atelectasis with associated pleural effusion. Electronically Signed   By: Marijo Conception, M.D.   On: 02/23/2016 17:37  "  I personally reviewed the radiologic studies   ED Course: * The patient's differential included in an acute exacerbation of her pulmonary edema and congestive heart failure along with COPD and/or pneumonia. Patient has a mild infiltrate on her chest x-ray with her clinical presentation was more indicative of an exacerbation of her pulmonary edema along with peripheral edema. On the well to IV Lasix here for symptomatic improvement. She also was very hypertensive and had nitroglycerin paste applied. The  patient was advised continue all of her current medication and I felt given her current presentation we could attempt outpatient treatment     Assessment:  Acute exacerbation of chronic pulmonary edema      Plan: * Outpatient Patient was increased on her torsemide Zithromax was prescribed just in case this was community-acquired pneumonia Patient was advised to return immediately if condition worsens. Patient was advised to follow up with their primary care physician or other specialized physicians involved in their outpatient care. The patient and/or family member/power of attorney had laboratory results reviewed at the bedside. All questions and concerns were addressed and appropriate discharge instructions were distributed by the nursing staff.             Daymon Larsen, MD 03/23/16 2250

## 2016-03-23 NOTE — Discharge Instructions (Signed)
Please take an extra tablet of the furosemide in the evening. (40 mg in the morning and 20 mg in the evening). Please contact your primary physician for further outpatient follow-up and recheck. Continue with her other prescription medication.  Please return immediately if condition worsens. Please contact her primary physician or the physician you were given for referral. If you have any specialist physicians involved in her treatment and plan please also contact them. Thank you for using [facility].

## 2016-03-23 NOTE — ED Triage Notes (Signed)
Pt 89% on chronic 3L. Placed on 4L with sat 93%.

## 2016-03-25 ENCOUNTER — Ambulatory Visit: Payer: Medicaid Other | Attending: Family | Admitting: Family

## 2016-03-25 ENCOUNTER — Encounter: Payer: Self-pay | Admitting: Family

## 2016-03-25 VITALS — BP 160/80 | HR 95 | Resp 20 | Ht 64.0 in | Wt 376.0 lb

## 2016-03-25 DIAGNOSIS — I5032 Chronic diastolic (congestive) heart failure: Secondary | ICD-10-CM

## 2016-03-25 DIAGNOSIS — I110 Hypertensive heart disease with heart failure: Secondary | ICD-10-CM | POA: Diagnosis not present

## 2016-03-25 DIAGNOSIS — Z833 Family history of diabetes mellitus: Secondary | ICD-10-CM | POA: Diagnosis not present

## 2016-03-25 DIAGNOSIS — Z882 Allergy status to sulfonamides status: Secondary | ICD-10-CM | POA: Insufficient documentation

## 2016-03-25 DIAGNOSIS — Z9981 Dependence on supplemental oxygen: Secondary | ICD-10-CM | POA: Diagnosis not present

## 2016-03-25 DIAGNOSIS — Z79899 Other long term (current) drug therapy: Secondary | ICD-10-CM | POA: Insufficient documentation

## 2016-03-25 DIAGNOSIS — Z888 Allergy status to other drugs, medicaments and biological substances status: Secondary | ICD-10-CM | POA: Insufficient documentation

## 2016-03-25 DIAGNOSIS — E119 Type 2 diabetes mellitus without complications: Secondary | ICD-10-CM | POA: Diagnosis not present

## 2016-03-25 DIAGNOSIS — E1122 Type 2 diabetes mellitus with diabetic chronic kidney disease: Secondary | ICD-10-CM

## 2016-03-25 DIAGNOSIS — N183 Chronic kidney disease, stage 3 unspecified: Secondary | ICD-10-CM

## 2016-03-25 DIAGNOSIS — Z8249 Family history of ischemic heart disease and other diseases of the circulatory system: Secondary | ICD-10-CM | POA: Diagnosis not present

## 2016-03-25 DIAGNOSIS — Z7951 Long term (current) use of inhaled steroids: Secondary | ICD-10-CM | POA: Diagnosis not present

## 2016-03-25 DIAGNOSIS — J410 Simple chronic bronchitis: Secondary | ICD-10-CM

## 2016-03-25 DIAGNOSIS — Z7982 Long term (current) use of aspirin: Secondary | ICD-10-CM | POA: Diagnosis not present

## 2016-03-25 DIAGNOSIS — Z794 Long term (current) use of insulin: Secondary | ICD-10-CM | POA: Insufficient documentation

## 2016-03-25 DIAGNOSIS — J441 Chronic obstructive pulmonary disease with (acute) exacerbation: Secondary | ICD-10-CM | POA: Insufficient documentation

## 2016-03-25 DIAGNOSIS — I10 Essential (primary) hypertension: Secondary | ICD-10-CM

## 2016-03-25 DIAGNOSIS — J449 Chronic obstructive pulmonary disease, unspecified: Secondary | ICD-10-CM | POA: Diagnosis not present

## 2016-03-25 MED ORDER — LOSARTAN POTASSIUM 50 MG PO TABS: 50.0000 mg | ORAL_TABLET | Freq: Every day | ORAL | 5 refills | Status: DC

## 2016-03-25 MED ORDER — TORSEMIDE 20 MG PO TABS: 40.0000 mg | ORAL_TABLET | Freq: Every day | ORAL | 5 refills | Status: DC

## 2016-03-25 MED ORDER — METOLAZONE 5 MG PO TABS
5.0000 mg | ORAL_TABLET | Freq: Every day | ORAL | 0 refills | Status: DC
Start: 2016-03-25 — End: 2016-03-29

## 2016-03-25 NOTE — Progress Notes (Signed)
Patient ID: [name], female    DOB: [birth year], 46 y.o.   MRN: [id number]  HPI  Ms [name] is a 46 y/o female with a history of DM, iron deficiency anemia, HTN, COPD with chronic oxygen use, asthma, obesity and chronic heart failure.   Last echo was done 12/15/14 and showed an EF of 50-55% without any valvular regurgitation.   Was recently in the ED on 03/23/16 with exacerbation of HF. Was treated and released. Prior ED visit was 02/23/16 for acute cystitis.  She presents today for a follow-up visit although hadn't been seen since August 2017. She has fatigue and shortness of breath with minimal exertion. Also says that she's been gaining 2-3 pounds every week over the last 3 months. Only takes her torsemide as needed and says that she only takes it when she gets swollen and tight. Has been out of losartan, recently been on prednisone and is now on zithromax.  Past Medical History:  Diagnosis Date  . Asthma   . CHF (congestive heart failure) ([name])   . COPD (chronic obstructive pulmonary disease) ([name])   . Diabetes mellitus without complication ([name])   . Hypertension   . Iron deficiency anemia   . Pericardial effusion    a. 11/2014 CT Chest: Mod effusion.  [name] Poorly controlled diabetes mellitus ([name])    a. 11/2014 A1c 13.2.   Past Surgical History:  Procedure Laterality Date  . CESAREAN SECTION     Family History  Problem Relation Age of Onset  . Diabetes Mother   . Hypertension Mother   . Hypertension Father   . Diabetes Father   . Hypertension    . Diabetes    . Breast cancer Maternal Aunt 26  . Breast cancer Maternal Aunt 25   Social History  Substance Use Topics  . Smoking status: Never Smoker  . Smokeless tobacco: Never Used  . Alcohol use 0.0 oz/week     Comment: occ   Allergies  Allergen Reactions  . Feraheme [Ferumoxytol] Itching  . Sulfa Antibiotics Itching   Prior to Admission medications   Medication Sig Start Date End Date Taking? Authorizing  Provider  acetaminophen (TYLENOL) 500 MG tablet Take 1,000 mg by mouth every 6 (six) hours as needed for mild pain, fever or headache.   Yes Historical Provider, MD  albuterol (PROVENTIL HFA;VENTOLIN HFA) 108 (90 BASE) MCG/ACT inhaler Inhale 2 puffs into the lungs every 6 (six) hours as needed for wheezing or shortness of breath. 12/19/14  Yes Dustin Flock, MD  amLODipine (NORVASC) 10 MG tablet Take 1 tablet (10 mg total) by mouth daily. 12/19/14  Yes Dustin Flock, MD  aspirin EC 81 MG tablet Take 81 mg by mouth at bedtime.   Yes Historical Provider, MD  azithromycin (ZITHROMAX Z-PAK) 250 MG tablet Take as directed 03/23/16  Yes Daymon Larsen, MD  budesonide (PULMICORT) 0.25 MG/2ML nebulizer solution Take 2 mLs (0.25 mg total) by nebulization 2 (two) times daily. 12/19/14  Yes Dustin Flock, MD  citalopram (CELEXA) 40 MG tablet Take 40 mg by mouth daily.   Yes Historical Provider, MD  guaiFENesin-dextromethorphan (ROBITUSSIN DM) 100-10 MG/5ML syrup Take 5 mLs by mouth every 4 (four) hours as needed for cough. 08/13/15  Yes Demetrios Loll, MD  hydrALAZINE (APRESOLINE) 100 MG tablet Take 1 tablet (100 mg total) by mouth every 8 (eight) hours. 12/19/14  Yes Dustin Flock, MD  insulin aspart (NOVOLOG FLEXPEN) 100 UNIT/ML FlexPen Inject 0-12 Units into the skin 3 (three)  times daily with meals as needed for high blood sugar. Pt uses as needed per sliding scale.   Yes Historical Provider, MD  Insulin Degludec (TRESIBA FLEXTOUCH) 100 UNIT/ML SOPN Inject 45 Units into the skin at bedtime. 08/13/15  Yes Demetrios Loll, MD  ipratropium-albuterol (DUONEB) 0.5-2.5 (3) MG/3ML SOLN Take 3 mLs by nebulization every 6 (six) hours as needed (for wheezing/shortness of breath).   Yes Historical Provider, MD  losartan (COZAAR) 50 MG tablet Take 1 tablet (50 mg total) by mouth daily. 03/25/16  Yes Alisa Graff, FNP  metFORMIN (GLUCOPHAGE) 500 MG tablet Take 500 mg by mouth 2 (two) times daily with a meal.   Yes Historical  Provider, MD  metoprolol (LOPRESSOR) 50 MG tablet Take 50 mg by mouth 2 (two) times daily.   Yes Historical Provider, MD  potassium chloride (K-DUR,KLOR-CON) 10 MEQ tablet Take 1 tablet (10 mEq total) by mouth 2 (two) times daily. 12/19/14  Yes Dustin Flock, MD  simvastatin (ZOCOR) 40 MG tablet Take 40 mg by mouth at bedtime.   Yes Historical Provider, MD  torsemide (DEMADEX) 20 MG tablet Take 2 tablets (40 mg total) by mouth daily. 03/25/16  Yes Alisa Graff, FNP            Review of Systems  Constitutional: Positive for fatigue. Negative for appetite change.  HENT: Negative for congestion, postnasal drip and sore throat.   Eyes: Negative.   Respiratory: Positive for cough, chest tightness and shortness of breath. Negative for wheezing.   Cardiovascular: Positive for leg swelling. Negative for chest pain and palpitations.  Gastrointestinal: Positive for abdominal distention and constipation.  Endocrine: Negative.   Genitourinary: Negative.   Musculoskeletal: Positive for back pain. Negative for neck pain.  Skin: Negative.   Allergic/Immunologic: Negative.   Neurological: Positive for light-headedness. Negative for dizziness.  Hematological: Negative for adenopathy. Does not bruise/bleed easily.  Psychiatric/Behavioral: Negative for dysphoric mood and suicidal ideas. The patient is nervous/anxious.    Vitals:   03/25/16 0917  BP: (!) 160/80  Pulse: 95  Resp: 20  SpO2: 90%  Weight: (!) 376 lb (170.6 kg)  Height: 5\' 4"  (1.626 m)   Wt Readings from Last 3 Encounters:  03/25/16 (!) 376 lb (170.6 kg)  03/23/16 (!) 380 lb (172.4 kg)  02/23/16 (!) 380 lb (172.4 kg)   Lab Results  Component Value Date   CREATININE 1.88 (H) 03/23/2016   CREATININE 1.60 (H) 02/23/2016   CREATININE 1.09 (H) 08/12/2015    Physical Exam  Constitutional: She is oriented to person, place, and time. She appears well-developed and well-nourished.  HENT:  Head: Normocephalic and atraumatic.  Eyes:  Conjunctivae are normal. Pupils are equal, round, and reactive to light.  Neck: Normal range of motion. Neck supple. JVD present.  Cardiovascular: Regular rhythm.  Tachycardia present.   Pulmonary/Chest: Effort normal. She has decreased breath sounds in the right upper field and the left upper field. She has no wheezes. She has no rales.  Abdominal: She exhibits distension. There is no tenderness.  Musculoskeletal: She exhibits edema (3+ pitting edema in bilateral lower legs) and tenderness (bilateral lower legs).  Neurological: She is alert and oriented to person, place, and time.  Skin: Skin is warm and dry.  Psychiatric: She has a normal mood and affect. Her behavior is normal. Thought content normal.  Nursing note and vitals reviewed.  Assessment & Plan:  1: Chronic heart failure with preserved ejection fraction- - NYHA class III - severely fluid overloaded today -  weight up 38 pounds since she was last here August 2017. Has not been taking her torsemide. Advised her that she needs to take it for an overnight weight gain of >2 pounds, weekly weight gain of > 5 pounds, worsening swelling or worsening shortness of breath - she is to take her torsemide daily until she returns - will add metolazone 5mg  daily for the next 3 days. Instructed her to take it 1/2 hour prior to her torsemide dose. She is also to take an additional potassium tablet daily for the next 3 days as well - Pharm D went in and reviewed medications with the patient - last saw cardiologist Rockey Situ) 12/25/15. Needs echo repeated  2: COPD- - initial O2 sat was 69% with her oxygen at 4L but with pulse setting. She wears it as continuous when at home but goes to pulse setting when out to conserve her tank. Placed on our tank at 4L continuous and she quickly rose to 97% - is going to schedule an appointment with pulmonologist Ashby Dawes). Last was seen 01/04/16 - had to suspend attending pulmonary rehab - currently on  zithromax  3: HTN- - BP elevated but she's been without her losartan and is >30 pounds heavier due to fluid retention - losartan prescription sent in and metolazone being added  4: Diabetes- - glucose recently was 230 although she just finished prednisone - will check a BMP at her next visit as she has underlying renal disease  Patient did not bring her medications nor a list. Each medication was verbally reviewed with the patient and she was encouraged to bring the bottles to every visit to confirm accuracy of list.  Return in 4 days for a recheck or sooner for any questions/problems before then.

## 2016-03-25 NOTE — Patient Instructions (Addendum)
Continue weighing daily and call for an overnight weight gain of > 2 pounds or a weekly weight gain of >5 pounds.  Take metolazone 1/2 hour prior to torsemide dose for the next 3 days.  Add an additional potassium tablet daily for the next 3 days along with the metolazone

## 2016-03-29 ENCOUNTER — Telehealth: Payer: Self-pay | Admitting: Family

## 2016-03-29 ENCOUNTER — Encounter: Payer: Medicaid Other | Attending: Internal Medicine

## 2016-03-29 ENCOUNTER — Encounter: Payer: Self-pay | Admitting: Family

## 2016-03-29 ENCOUNTER — Ambulatory Visit: Payer: Medicaid Other | Attending: Family | Admitting: Family

## 2016-03-29 VITALS — BP 138/71 | HR 80 | Resp 18 | Ht 64.0 in | Wt 364.2 lb

## 2016-03-29 DIAGNOSIS — N183 Chronic kidney disease, stage 3 (moderate): Secondary | ICD-10-CM

## 2016-03-29 DIAGNOSIS — Z79899 Other long term (current) drug therapy: Secondary | ICD-10-CM | POA: Diagnosis not present

## 2016-03-29 DIAGNOSIS — Z5189 Encounter for other specified aftercare: Secondary | ICD-10-CM | POA: Diagnosis present

## 2016-03-29 DIAGNOSIS — Z794 Long term (current) use of insulin: Secondary | ICD-10-CM | POA: Diagnosis not present

## 2016-03-29 DIAGNOSIS — J449 Chronic obstructive pulmonary disease, unspecified: Secondary | ICD-10-CM | POA: Insufficient documentation

## 2016-03-29 DIAGNOSIS — E119 Type 2 diabetes mellitus without complications: Secondary | ICD-10-CM | POA: Insufficient documentation

## 2016-03-29 DIAGNOSIS — J410 Simple chronic bronchitis: Secondary | ICD-10-CM

## 2016-03-29 DIAGNOSIS — F419 Anxiety disorder, unspecified: Secondary | ICD-10-CM | POA: Diagnosis not present

## 2016-03-29 DIAGNOSIS — I5032 Chronic diastolic (congestive) heart failure: Secondary | ICD-10-CM | POA: Insufficient documentation

## 2016-03-29 DIAGNOSIS — Z7982 Long term (current) use of aspirin: Secondary | ICD-10-CM | POA: Insufficient documentation

## 2016-03-29 DIAGNOSIS — I509 Heart failure, unspecified: Secondary | ICD-10-CM | POA: Insufficient documentation

## 2016-03-29 DIAGNOSIS — I10 Essential (primary) hypertension: Secondary | ICD-10-CM

## 2016-03-29 DIAGNOSIS — E1122 Type 2 diabetes mellitus with diabetic chronic kidney disease: Secondary | ICD-10-CM

## 2016-03-29 DIAGNOSIS — I110 Hypertensive heart disease with heart failure: Secondary | ICD-10-CM | POA: Diagnosis not present

## 2016-03-29 LAB — BASIC METABOLIC PANEL
ANION GAP: 5 (ref 5–15)
BUN: 19 mg/dL (ref 6–20)
CO2: 40 mmol/L — ABNORMAL HIGH (ref 22–32)
CREATININE: 1.69 mg/dL — AB (ref 0.44–1.00)
Calcium: 7.7 mg/dL — ABNORMAL LOW (ref 8.9–10.3)
Chloride: 94 mmol/L — ABNORMAL LOW (ref 101–111)
GFR calc non Af Amer: 36 mL/min — ABNORMAL LOW (ref >60)
GFR, EST AFRICAN AMERICAN: 41 mL/min — AB (ref >60)
Glucose, Bld: 173 mg/dL — ABNORMAL HIGH (ref 65–99)
POTASSIUM: 4.2 mmol/L (ref 3.5–5.1)
SODIUM: 139 mmol/L (ref 135–145)

## 2016-03-29 MED ORDER — METOLAZONE 5 MG PO TABS: 5.0000 mg | ORAL_TABLET | Freq: Every day | ORAL | 0 refills | Status: DC

## 2016-03-29 NOTE — Progress Notes (Signed)
Patient ID: [name], female    DOB: 1970-10-05, 46 y.o.   MRN: [id number]  HPI  [name] is a 46 y/o female with a history of DM, iron deficiency anemia, HTN, COPD with chronic oxygen use, asthma, obesity and chronic heart failure.   Last echo was done 12/15/14 and showed an EF of 50-55% without any valvular regurgitation.   Was recently in the ED on 03/23/16 with exacerbation of HF. Was treated and released. Prior ED visit was 02/23/16 for acute cystitis.  She presents today for a follow-up visit with fatigue and shortness of breath with minimal exertion. Continues to have a cough. Also continues to have abdominal swelling along with lower leg swelling although she does feel like that the legs are less swollen. Home weight is down after taking 3 days of metolazone along with an additional potassium tablet. Continues to endorse anxiety.  Past Medical History:  Diagnosis Date  . Asthma   . CHF (congestive heart failure) ([place])   . COPD (chronic obstructive pulmonary disease) ([place])   . Diabetes mellitus without complication ([place])   . Hypertension   . Iron deficiency anemia   . Pericardial effusion    a. 11/2014 CT Chest: Mod effusion.  [name] Poorly controlled diabetes mellitus ([place])    a. 11/2014 A1c 13.2.   Past Surgical History:  Procedure Laterality Date  . CESAREAN SECTION     Family History  Problem Relation Age of Onset  . Diabetes Mother   . Hypertension Mother   . Hypertension Father   . Diabetes Father   . Hypertension    . Diabetes    . Breast cancer Maternal Aunt 53  . Breast cancer Maternal Aunt 8   Social History  Substance Use Topics  . Smoking status: Never Smoker  . Smokeless tobacco: Never Used  . Alcohol use 0.0 oz/week     Comment: occ   Allergies  Allergen Reactions  . Feraheme [Ferumoxytol] Itching  . Sulfa Antibiotics Itching   Prior to Admission medications   Medication Sig Start Date End Date Taking? Authorizing Provider  acetaminophen  (TYLENOL) 500 MG tablet Take 1,000 mg by mouth every 6 (six) hours as needed for mild pain, fever or headache.   Yes Historical Provider, MD  albuterol (PROVENTIL HFA;VENTOLIN HFA) 108 (90 BASE) MCG/ACT inhaler Inhale 2 puffs into the lungs every 6 (six) hours as needed for wheezing or shortness of breath. 12/19/14  Yes Dustin Flock, MD  amLODipine (NORVASC) 10 MG tablet Take 1 tablet (10 mg total) by mouth daily. 12/19/14  Yes Dustin Flock, MD  aspirin EC 81 MG tablet Take 81 mg by mouth at bedtime.   Yes Historical Provider, MD  budesonide (PULMICORT) 0.25 MG/2ML nebulizer solution Take 2 mLs (0.25 mg total) by nebulization 2 (two) times daily. 12/19/14  Yes Dustin Flock, MD  citalopram (CELEXA) 40 MG tablet Take 40 mg by mouth daily.   Yes Historical Provider, MD  guaiFENesin-dextromethorphan (ROBITUSSIN DM) 100-10 MG/5ML syrup Take 5 mLs by mouth every 4 (four) hours as needed for cough. 08/13/15  Yes Demetrios Loll, MD  hydrALAZINE (APRESOLINE) 100 MG tablet Take 1 tablet (100 mg total) by mouth every 8 (eight) hours. 12/19/14  Yes Dustin Flock, MD  insulin aspart (NOVOLOG FLEXPEN) 100 UNIT/ML FlexPen Inject 0-12 Units into the skin 3 (three) times daily with meals as needed for high blood sugar. Pt uses as needed per sliding scale.   Yes Historical Provider, MD  Insulin Degludec (  TRESIBA FLEXTOUCH) 100 UNIT/ML SOPN Inject 45 Units into the skin at bedtime. 08/13/15  Yes Demetrios Loll, MD  losartan (COZAAR) 50 MG tablet Take 1 tablet (50 mg total) by mouth daily. 03/25/16  Yes Alisa Graff, FNP  metFORMIN (GLUCOPHAGE) 500 MG tablet Take 500 mg by mouth 2 (two) times daily with a meal.   Yes Historical Provider, MD  metoprolol (LOPRESSOR) 50 MG tablet Take 50 mg by mouth 2 (two) times daily.   Yes Historical Provider, MD  potassium chloride (K-DUR,KLOR-CON) 10 MEQ tablet Take 1 tablet (10 mEq total) by mouth 2 (two) times daily. 12/19/14  Yes Dustin Flock, MD  simvastatin (ZOCOR) 40 MG tablet Take  40 mg by mouth at bedtime.   Yes Historical Provider, MD  torsemide (DEMADEX) 20 MG tablet Take 2 tablets (40 mg total) by mouth daily. 03/25/16  Yes Alisa Graff, FNP  ipratropium-albuterol (DUONEB) 0.5-2.5 (3) MG/3ML SOLN Take 3 mLs by nebulization every 6 (six) hours as needed (for wheezing/shortness of breath).    Historical Provider, MD  metolazone (ZAROXOLYN) 5 MG tablet Take 1 tablet (5 mg total) by mouth daily. 03/29/16 04/02/16  Alisa Graff, FNP   Review of Systems  Constitutional: Positive for fatigue. Negative for appetite change.  HENT: Positive for postnasal drip. Negative for sore throat.   Eyes: Negative.   Respiratory: Positive for cough, shortness of breath and wheezing. Negative for chest tightness.   Cardiovascular: Positive for leg swelling. Negative for chest pain and palpitations.  Gastrointestinal: Positive for abdominal distention. Negative for abdominal pain.  Endocrine: Negative.   Genitourinary: Negative.   Musculoskeletal: Positive for arthralgias (knees) and back pain.  Skin: Negative.   Allergic/Immunologic: Negative.   Neurological: Positive for light-headedness (sometimes). Negative for dizziness.  Hematological: Negative for adenopathy. Does not bruise/bleed easily.  Psychiatric/Behavioral: Positive for sleep disturbance (sleeping on 1 1/2 pillows). Negative for dysphoric mood and suicidal ideas. The patient is nervous/anxious (all the time).    Vitals:   03/29/16 1014  BP: 138/71  Pulse: 80  Resp: 18  SpO2: (!) 86%  Weight: (!) 364 lb 4 oz (165.2 kg)  Height: 5\' 4"  (1.626 m)   Wt Readings from Last 3 Encounters:  03/29/16 (!) 364 lb 4 oz (165.2 kg)  03/25/16 (!) 376 lb (170.6 kg)  03/23/16 (!) 380 lb (172.4 kg)    Physical Exam  Constitutional: She is oriented to person, place, and time. She appears well-developed and well-nourished.  HENT:  Head: Normocephalic and atraumatic.  Eyes: Conjunctivae are normal. Pupils are equal, round, and  reactive to light.  Neck: Normal range of motion. Neck supple. JVD present.  Cardiovascular: Normal rate and regular rhythm.   Pulmonary/Chest: Effort normal. She has no wheezes. She has no rales.  Abdominal: Soft. She exhibits distension. There is no tenderness.  Musculoskeletal: She exhibits edema (2+ pitting edema in bilateral lower legs). She exhibits no tenderness.  Neurological: She is alert and oriented to person, place, and time.  Skin: Skin is warm and dry.  Psychiatric: She has a normal mood and affect. Her behavior is normal. Thought content normal.  Nursing note and vitals reviewed.  Assessment & Plan:  1: Chronic heart failure with preserved ejection fraction- - NYHA class III - continues to be severely fluid overloaded today - weight down 12 pounds since taking 3 days of metolazone. Continue weighing daily and call for a weight gain of >2 pounds, weekly weight gain of > 5 pounds, worsening swelling or  worsening shortness of breath - continue daily torsemide - will check a BMP today and depending on results, will have her take metolazone again for 4 days - last saw cardiologist Rockey Situ) 12/25/15. Needs echo repeated  2: COPD- - initial O2 sat was 86% with her oxygen at 4L but with pulse setting. She wears it as continuous when at home but goes to pulse setting when out to conserve her tank. Placed on our tank at 4L continuous and she quickly rose to 98% - is going to schedule an appointment with pulmonologist Ashby Dawes). Last was seen 01/04/16 - had to suspend attending pulmonary rehab  3: HTN- - BP better than at her last visit  4: Diabetes- - glucose recently was 200   5: Anxiety- - patient says that she's anxious often and would like to try a different medication - would like a referral to psychiatry as she feels like her PCP would just make the referral as well - psychiatry referral will be placed  Return in 1 week or sooner for any questions/problems before  then.   Patient did not bring her medications nor a list. Each medication was verbally reviewed with the patient and she was encouraged to bring the bottles to every visit to confirm accuracy of list.

## 2016-03-29 NOTE — Patient Instructions (Signed)
Continue weighing daily and call for an overnight weight gain of > 2 pounds or a weekly weight gain of >5 pounds. 

## 2016-03-29 NOTE — Telephone Encounter (Signed)
Informed patient of her lab results that were drawn today (K is 4.2, Cr 1.69 & GFR 41). Renal function has actually improved from 6 days ago even after taking 3 days of metolazone.   Based on lab results and her continued edema in her legs/abdomen, advised patient that I would call in 4 days of metolazone so that she will take 5mg  daily for the next 4 days. She is to take an additional 71meq potassium each day that she takes the metolazone (just like before).   If she responds dramatically after 3 doses, she doesn't need to take the 4th dose. She is returning on 04/04/16 and will have labs drawn again that day.   Patient verbalized understanding of instructions.

## 2016-04-04 ENCOUNTER — Ambulatory Visit: Payer: Medicaid Other | Admitting: Family

## 2016-04-08 ENCOUNTER — Encounter: Payer: Self-pay | Admitting: Respiratory Therapy

## 2016-04-08 DIAGNOSIS — J45909 Unspecified asthma, uncomplicated: Secondary | ICD-10-CM

## 2016-04-08 DIAGNOSIS — I5032 Chronic diastolic (congestive) heart failure: Secondary | ICD-10-CM

## 2016-04-08 NOTE — Progress Notes (Signed)
Pulmonary Individual Treatment Plan  Patient Details  Name: [name] MRN: [id number] Date of Birth: 04-02-1970 Referring Provider:   Flowsheet Row Pulmonary Rehab from 01/15/2016 in [facility] Cardiac and Pulmonary Rehab  Referring Provider  Ramashandran MD      Initial Encounter Date:  Flowsheet Row Pulmonary Rehab from 01/15/2016 in [facility] Cardiac and Pulmonary Rehab  Date  01/15/16  Referring Provider  Ramashandran MD      Visit Diagnosis: Asthma, unspecified asthma severity, unspecified whether complicated, unspecified whether persistent  Chronic diastolic congestive heart failure ([place])  Morbid obesity, unspecified obesity type ([place])  [name] Home Medications on Admission:  Current Outpatient Prescriptions:    acetaminophen (TYLENOL) 500 MG tablet, Take 1,000 mg by mouth every 6 (six) hours as needed for mild pain, fever or headache., Disp: , Rfl:    albuterol (PROVENTIL HFA;VENTOLIN HFA) 108 (90 BASE) MCG/ACT inhaler, Inhale 2 puffs into the lungs every 6 (six) hours as needed for wheezing or shortness of breath., Disp: 1 Inhaler, Rfl: 0   amLODipine (NORVASC) 10 MG tablet, Take 1 tablet (10 mg total) by mouth daily., Disp: 30 tablet, Rfl: 0   aspirin EC 81 MG tablet, Take 81 mg by mouth at bedtime., Disp: , Rfl:    budesonide (PULMICORT) 0.25 MG/2ML nebulizer solution, Take 2 mLs (0.25 mg total) by nebulization 2 (two) times daily., Disp: 60 mL, Rfl: 12   citalopram (CELEXA) 40 MG tablet, Take 40 mg by mouth daily., Disp: , Rfl:    guaiFENesin-dextromethorphan (ROBITUSSIN DM) 100-10 MG/5ML syrup, Take 5 mLs by mouth every 4 (four) hours as needed for cough., Disp: 118 mL, Rfl: 0   hydrALAZINE (APRESOLINE) 100 MG tablet, Take 1 tablet (100 mg total) by mouth every 8 (eight) hours., Disp: 60 tablet, Rfl: 0   insulin aspart (NOVOLOG FLEXPEN) 100 UNIT/ML FlexPen, Inject 0-12 Units into the skin 3 (three) times daily with meals as needed for high blood sugar. Pt uses as  needed per sliding scale., Disp: , Rfl:    Insulin Degludec (TRESIBA FLEXTOUCH) 100 UNIT/ML SOPN, Inject 45 Units into the skin at bedtime., Disp: 1 pen, Rfl: 2   ipratropium-albuterol (DUONEB) 0.5-2.5 (3) MG/3ML SOLN, Take 3 mLs by nebulization every 6 (six) hours as needed (for wheezing/shortness of breath)., Disp: , Rfl:    losartan (COZAAR) 50 MG tablet, Take 1 tablet (50 mg total) by mouth daily., Disp: 30 tablet, Rfl: 5   metFORMIN (GLUCOPHAGE) 500 MG tablet, Take 500 mg by mouth 2 (two) times daily with a meal., Disp: , Rfl:    metolazone (ZAROXOLYN) 5 MG tablet, Take 1 tablet (5 mg total) by mouth daily., Disp: 4 tablet, Rfl: 0   metoprolol (LOPRESSOR) 50 MG tablet, Take 50 mg by mouth 2 (two) times daily., Disp: , Rfl:    potassium chloride (K-DUR,KLOR-CON) 10 MEQ tablet, Take 1 tablet (10 mEq total) by mouth 2 (two) times daily., Disp: 30 tablet, Rfl: 0   simvastatin (ZOCOR) 40 MG tablet, Take 40 mg by mouth at bedtime., Disp: , Rfl:    torsemide (DEMADEX) 20 MG tablet, Take 2 tablets (40 mg total) by mouth daily., Disp: 60 tablet, Rfl: 5  Past Medical History: Past Medical History:  Diagnosis Date   Asthma    CHF (congestive heart failure) (HCC)    COPD (chronic obstructive pulmonary disease) (HCC)    Diabetes mellitus without complication (HCC)    Hypertension    Iron deficiency anemia    Pericardial effusion    a.  11/2014 CT Chest: Mod effusion.   Poorly controlled diabetes mellitus ([place])    a. 11/2014 A1c 13.2.    Tobacco Use: History  Smoking Status   Never Smoker  Smokeless Tobacco   Never Used    Labs: Recent Review Flowsheet Data    Labs for ITP Cardiac and Pulmonary Rehab Latest Ref Rng & Units 12/15/2014 01/03/2015 08/09/2015 02/23/2016   Cholestrol 0 - 200 mg/dL - 232(H) - -   LDLCALC 0 - 99 mg/dL - 169(H) - -   HDL >40 mg/dL - 41 - -   Trlycerides <150 mg/dL - 112 - -   Hemoglobin A1c 4.0 - 6.0 % 13.2(H) - 10.7(H) -   HCO3 20.0 - 28.0  mmol/L - - 36.4(H) 38.0(H)   O2SAT % - - 80.3 84.3       ADL UCSD:     Pulmonary Assessment Scores    Row Name 01/15/16 1615         ADL UCSD   ADL Phase Entry     SOB Score total 118     Rest 3     Walk 5     Stairs 5     Bath 5     Dress 5     Shop 5        Pulmonary Function Assessment:     Pulmonary Function Assessment - 01/15/16 1614      Pulmonary Function Tests   FVC% 27 %   FEV1% 26 %   FEV1/FVC Ratio 79.61     Initial Spirometry Results   Comments Test date 01/15/16     Breath   Bilateral Breath Sounds Clear;Decreased   Shortness of Breath Yes;Limiting activity;Panic with Shortness of Breath;Fear of Shortness of Breath      Exercise Target Goals:    Exercise Program Goal: Individual exercise prescription set with THRR, safety & activity barriers. Participant demonstrates ability to understand and report RPE using BORG scale, to self-measure pulse accurately, and to acknowledge the importance of the exercise prescription.  Exercise Prescription Goal: Starting with aerobic activity 30 plus minutes a day, 3 days per week for initial exercise prescription. Provide home exercise prescription and guidelines that participant acknowledges understanding prior to discharge.  Activity Barriers & Risk Stratification:     Activity Barriers & Cardiac Risk Stratification - 01/15/16 1613      Activity Barriers & Cardiac Risk Stratification   Activity Barriers Joint Problems;Deconditioning;Muscular Weakness;Shortness of Breath;Assistive Device;Balance Concerns  also has a fear of falling, uses rollator at home and was encouraged to bring it to rehab.   Cardiac Risk Stratification Moderate      6 Minute Walk:     6 Minute Walk    Row Name 01/15/16 1637         6 Minute Walk   Phase Initial     Distance 140 feet     Walk Time 2.33 minutes     # of Rest Breaks 2  1:26, stopped at 2:56      MPH 0.68     METS 1.5     RPE 12     Perceived Dyspnea   5     VO2 Peak 1.99     Symptoms Yes (comment)     Comments hurting pain in legs and back (8/10), anxiety about falling     Resting HR 83 bpm     Resting BP 166/60     Max Ex. HR 90 bpm  Max Ex. BP 152/54     2 Minute Post BP 134/60       Interval HR   Baseline HR 83     1 Minute HR 85     2 Minute HR 84     3 Minute HR 90     4 Minute HR 86     5 Minute HR 84     6 Minute HR 84     2 Minute Post HR 82     Interval Heart Rate? Yes       Interval Oxygen   Interval Oxygen? Yes     Baseline Oxygen Saturation % 96 %  was 88% on her pulsed 3L     Baseline Liters of Oxygen 4 L  switched to e-cylinder continuous flow     1 Minute Oxygen Saturation % 95 %  rest break 30-1:56     1 Minute Liters of Oxygen 4 L     2 Minute Oxygen Saturation % 94 %     2 Minute Liters of Oxygen 4 L     3 Minute Oxygen Saturation % 94 %  pt sat down at 2:56     3 Minute Liters of Oxygen 4 L     4 Minute Oxygen Saturation % 96 %     4 Minute Liters of Oxygen 4 L     5 Minute Oxygen Saturation % 98 %     5 Minute Liters of Oxygen 4 L     6 Minute Oxygen Saturation % 100 %     6 Minute Liters of Oxygen 4 L     2 Minute Post Oxygen Saturation % 100 %     2 Minute Post Liters of Oxygen 4 L        Initial Exercise Prescription:     Initial Exercise Prescription - 01/15/16 1600      Date of Initial Exercise RX and Referring Provider   Date 01/15/16   Referring Provider Ramashandran MD     Oxygen   Oxygen Continuous   Liters 4     Treadmill   MPH 0.5   Grade 0   Minutes 3  1 min intervals   METs 1.5     T5 Nustep   Level 1   Watts --  60-80 spm   Minutes 15   METs 1.5     Biostep-RELP   Level 1   Watts --  40-50 spm   Minutes 15   METs 1     Prescription Details   Frequency (times per week) 3   Duration Progress to 45 minutes of aerobic exercise without signs/symptoms of physical distress     Intensity   THRR 40-80% of Max Heartrate 120-157   Ratings of Perceived  Exertion 11-15   Perceived Dyspnea 0-4     Progression   Progression Continue to progress workloads to maintain intensity without signs/symptoms of physical distress.     Resistance Training   Training Prescription Yes   Weight 2 lbs   Reps 10-12      Perform Capillary Blood Glucose checks as needed.  Exercise Prescription Changes:     Exercise Prescription Changes    Row Name 01/22/16 1300 01/30/16 1400           Exercise Review   Progression No Yes        Response to Exercise   Blood Pressure (Admit) 138/82 184/70      Blood Pressure (  Exercise) 168/88 166/82      Blood Pressure (Exit) 138/80 148/68      Heart Rate (Admit) 89 bpm 81 bpm      Heart Rate (Exercise) 95 bpm 83 bpm      Heart Rate (Exit) 86 bpm 82 bpm      Oxygen Saturation (Admit) 96 % 90 %      Oxygen Saturation (Exercise) 93 % 95 %      Oxygen Saturation (Exit) 100 % 99 %      Rating of Perceived Exertion (Exercise) 15 11      Perceived Dyspnea (Exercise) 3 3      Symptoms  -- none      Duration Progress to 45 minutes of aerobic exercise without signs/symptoms of physical distress Progress to 45 minutes of aerobic exercise without signs/symptoms of physical distress      Intensity THRR unchanged THRR unchanged        Progression   Progression Continue to progress workloads to maintain intensity without signs/symptoms of physical distress. Continue to progress workloads to maintain intensity without signs/symptoms of physical distress.      Average METs  -- 1.45        Resistance Training   Training Prescription Yes Yes      Weight 2 2      Reps 10-12 10-12        Interval Training   Interval Training No No        Oxygen   Oxygen Continuous Continuous      Liters 4 4        Treadmill   MPH 0.5 0.5      Grade 0 0      Minutes 3 3      METs  -- 1.4        T5 Nustep   Level  -- 1      Minutes  -- 15      METs  -- 1.5        Biostep-RELP   Level 1 1      Minutes 17 15      METs  -- 1          Exercise Comments:     Exercise Comments    Row Name 01/15/16 1642 01/30/16 1421 02/14/16 1520       Exercise Comments [name] wants to get her independence back and to be able to get out more.  She has been spending a lot of time in her bed.  I talked with her and her husband about getting up and moving out to the living room to sit in the chair with her feet propped up to help start to increase her strength and stamina. [name]'[name] first day of exercise was 11/27.  She did great.  She has done well with her first three visits.  She has gotten out of the bed and into the chair in the living room.  We will continue to monitor her progression. [name] has been out since last review.        Discharge Exercise Prescription (Final Exercise Prescription Changes):     Exercise Prescription Changes - 01/30/16 1400      Exercise Review   Progression Yes     Response to Exercise   Blood Pressure (Admit) 184/70   Blood Pressure (Exercise) 166/82   Blood Pressure (Exit) 148/68   Heart Rate (Admit) 81 bpm   Heart Rate (Exercise) 83 bpm   Heart Rate (Exit)  82 bpm   Oxygen Saturation (Admit) 90 %   Oxygen Saturation (Exercise) 95 %   Oxygen Saturation (Exit) 99 %   Rating of Perceived Exertion (Exercise) 11   Perceived Dyspnea (Exercise) 3   Symptoms none   Duration Progress to 45 minutes of aerobic exercise without signs/symptoms of physical distress   Intensity THRR unchanged     Progression   Progression Continue to progress workloads to maintain intensity without signs/symptoms of physical distress.   Average METs 1.45     Resistance Training   Training Prescription Yes   Weight 2   Reps 10-12     Interval Training   Interval Training No     Oxygen   Oxygen Continuous   Liters 4     Treadmill   MPH 0.5   Grade 0   Minutes 3   METs 1.4     T5 Nustep   Level 1   Minutes 15   METs 1.5     Biostep-RELP   Level 1   Minutes 15   METs 1       Nutrition:   Target Goals: Understanding of nutrition guidelines, daily intake of sodium <1520m, cholesterol <2034m calories 30% from fat and 7% or less from saturated fats, daily to have 5 or more servings of fruits and vegetables.  Biometrics:    Nutrition Therapy Plan and Nutrition Goals:   Nutrition Discharge: Rate Your Plate Scores:   Psychosocial: Target Goals: Acknowledge presence or absence of depression, maximize coping skills, provide positive support system. Participant is able to verbalize types and ability to use techniques and skills needed for reducing stress and depression.  Initial Review & Psychosocial Screening:     Initial Psych Review & Screening - 01/15/16 1623      Family Dynamics   Good Support System? Yes   Comments [name] good support from her family and knows that she has to make changes to her life for a healthier life for her family and herself.     Barriers   Psychosocial barriers to participate in program The patient should benefit from training in stress management and relaxation.     Screening Interventions   Interventions Encouraged to exercise;Program counselor consult      Quality of Life Scores:     Quality of Life - 01/15/16 1625      Quality of Life Scores   Health/Function Pre 20.75 %   Socioeconomic Pre 20.43 %   Psych/Spiritual Pre 21 %   Family Pre 20.6 %   GLOBAL Pre 20.71 %      PHQ-9: Recent Review Flowsheet Data    [facility] 03/29/2016 03/25/2016 01/15/2016 10/02/2015 08/30/2015   Decreased Interest 1 0 2 0 0   Down, Depressed, Hopeless 1 2 1  0 0   PHQ - 2 Score 2 2 3  0 0   Altered sleeping 3 1 3  - -   Tired, decreased energy 3 2 3  - -   Change in appetite 1 1 2  - -   Feeling bad or failure about yourself  1 3 2  - -   Trouble concentrating 1 3 2  - -   Moving slowly or fidgety/restless 1 1 1  - -   Suicidal thoughts 0 0 1 - -   PHQ-9 Score 12 13 17  - -   Difficult doing work/chores Very difficult - Very  difficult - -      Psychosocial Evaluation and Intervention:  Psychosocial Evaluation - 01/24/16 1223      Psychosocial Evaluation & Interventions   Interventions Stress management education;Relaxation education;Encouraged to exercise with the program and follow exercise prescription   Comments Counselor met with [name] ([name]) today for initial psychosocial evaluation.  She is a 46 year old who has multiple health issues including COPD; Type 2 Diabetes; CHF and Asthma.  She has a strong support system with a spouse of 15 years and (4) adult children - (2) of which live either in the home or close by.  [name]. Chauncey [name] reports sleeping better since she went on Oxygen 6 months ago.  Her appetite is up and down - but mostly good.    She reports a history of anxiety and has been on a variety of medications for this and is currently on Celexa 40 mg.  Counselor discussed with [name] her PHQ-9 score of "17" and of this indicating moderately severe depression.  She denies SI/HI but does admit to a lot of stress in her life currently - particularly since she is unable to work and her spouse is on SSI so finances are extremely limited.  She is concerned about having the means to put gas in her car for these classes 3x/week.  [name]. Chauncey [name] has goals to "get back up and moving around" since she admits to being bed-ridden for the past year or so.  counselor encouraged her to come consistently - if possible for the best results.  And to speak with the nurses about transportation options that might be available.  Counselor also mentioned if mood does not improve with consistent exercise in the next few weeks, [name]. Chauncey [name] may need to speak with her Dr. about increasing her medications for depression.  Counselor and staff will continue to follow with her throughout the course of this program.     Continued Psychosocial Services Needed Yes  [name]. Chauncey [name] may need her Rx for depression increased if exercise does not help over the next few weeks.   She agreed.  Also, she may have barriers to attend with limited $$ for gasoline.        Psychosocial Re-Evaluation:  Education: Education Goals: Education classes will be provided on a weekly basis, covering required topics. Participant will state understanding/return demonstration of topics presented.  Learning Barriers/Preferences:     Learning Barriers/Preferences - 01/15/16 1613      Learning Barriers/Preferences   Learning Barriers None   Learning Preferences None      Education Topics: Initial Evaluation Education: - Verbal, written and demonstration of respiratory meds, RPE/PD scales, oximetry and breathing techniques. Instruction on use of nebulizers and MDIs: cleaning and proper use, rinsing mouth with steroid doses and importance of monitoring MDI activations. Flowsheet Row Pulmonary Rehab from 01/29/2016 in [facility] Cardiac and Pulmonary Rehab  Date  01/15/16  Educator  LB  Instruction Review Code  2- meets goals/outcomes      General Nutrition Guidelines/Fats and Fiber: -Group instruction provided by verbal, written material, models and posters to present the general guidelines for heart healthy nutrition. Gives an explanation and review of dietary fats and fiber.   Controlling Sodium/Reading Food Labels: -Group verbal and written material supporting the discussion of sodium use in heart healthy nutrition. Review and explanation with models, verbal and written materials for utilization of the food label.   Exercise Physiology & Risk Factors: - Group verbal and written instruction with models to review the exercise physiology of the cardiovascular system and associated critical  values. Details cardiovascular disease risk factors and the goals associated with each risk factor.   Aerobic Exercise & Resistance Training: - Gives group verbal and written discussion on the health impact of inactivity. On the components of aerobic and resistive training programs and the  benefits of this training and how to safely progress through these programs.   Flexibility, Balance, General Exercise Guidelines: - Provides group verbal and written instruction on the benefits of flexibility and balance training programs. Provides general exercise guidelines with specific guidelines to those with heart or lung disease. Demonstration and skill practice provided.   Stress Management: - Provides group verbal and written instruction about the health risks of elevated stress, cause of high stress, and healthy ways to reduce stress.   Depression: - Provides group verbal and written instruction on the correlation between heart/lung disease and depressed mood, treatment options, and the stigmas associated with seeking treatment. Flowsheet Row Pulmonary Rehab from 01/29/2016 in [facility] Cardiac and Pulmonary Rehab  Date  01/29/16  Educator  [facility]  Instruction Review Code  2- meets goals/outcomes      Exercise & Equipment Safety: - Individual verbal instruction and demonstration of equipment use and safety with use of the equipment.   Infection Prevention: - Provides verbal and written material to individual with discussion of infection control including proper hand washing and proper equipment cleaning during exercise session. Flowsheet Row Pulmonary Rehab from 01/29/2016 in [facility] Cardiac and Pulmonary Rehab  Date  01/15/16  Educator  LB  Instruction Review Code  2- meets goals/outcomes      Falls Prevention: - Provides verbal and written material to individual with discussion of falls prevention and safety. Flowsheet Row Pulmonary Rehab from 01/29/2016 in [facility] Cardiac and Pulmonary Rehab  Date  01/15/16  Educator  LB  Instruction Review Code  2- meets goals/outcomes      Diabetes: - Individual verbal and written instruction to review signs/symptoms of diabetes, desired ranges of glucose level fasting, after meals and with exercise. Advice that pre and post exercise glucose  checks will be done for 3 sessions at entry of program.   Chronic Lung Diseases: - Group verbal and written instruction to review new updates, new respiratory medications, new advancements in procedures and treatments. Provide informative websites and "800" numbers of self-education. Flowsheet Row Pulmonary Rehab from 01/29/2016 in [facility] Cardiac and Pulmonary Rehab  Date  01/24/16  Educator  LB  Instruction Review Code  2- meets goals/outcomes      Lung Procedures: - Group verbal and written instruction to describe testing methods done to diagnose lung disease. Review the outcome of test results. Describe the treatment choices: Pulmonary Function Tests, ABGs and oximetry.   Energy Conservation: - Provide group verbal and written instruction for methods to conserve energy, plan and organize activities. Instruct on pacing techniques, use of adaptive equipment and posture/positioning to relieve shortness of breath.   Triggers: - Group verbal and written instruction to review types of environmental controls: home humidity, furnaces, filters, dust mite/pet prevention, HEPA vacuums. To discuss weather changes, air quality and the benefits of nasal washing.   Exacerbations: - Group verbal and written instruction to provide: warning signs, infection symptoms, calling MD promptly, preventive modes, and value of vaccinations. Review: effective airway clearance, coughing and/or vibration techniques. Create an Sports administrator.   Oxygen: - Individual and group verbal and written instruction on oxygen therapy. Includes supplement oxygen, available portable oxygen systems, continuous and intermittent flow rates, oxygen safety, concentrators, and Medicare reimbursement for  oxygen. Flowsheet Row Pulmonary Rehab from 01/29/2016 in [facility] Cardiac and Pulmonary Rehab  Date  01/15/16  Educator  LB  Instruction Review Code  2- meets goals/outcomes      Respiratory Medications: - Group verbal and written  instruction to review medications for lung disease. Drug class, frequency, complications, importance of spacers, rinsing mouth after steroid MDI'[name], and proper cleaning methods for nebulizers. Flowsheet Row Pulmonary Rehab from 01/29/2016 in [facility] Cardiac and Pulmonary Rehab  Date  01/15/16  Educator  LB  Instruction Review Code  2- meets goals/outcomes      AED/CPR: - Group verbal and written instruction with the use of models to demonstrate the basic use of the AED with the basic ABC'[name] of resuscitation.   Breathing Retraining: - Provides individuals verbal and written instruction on purpose, frequency, and proper technique of diaphragmatic breathing and pursed-lipped breathing. Applies individual practice skills. Flowsheet Row Pulmonary Rehab from 01/29/2016 in [facility] Cardiac and Pulmonary Rehab  Date  01/15/16  Educator  LB  Instruction Review Code  2- meets goals/outcomes      Anatomy and Physiology of the Lungs: - Group verbal and written instruction with the use of models to provide basic lung anatomy and physiology related to function, structure and complications of lung disease.   Heart Failure: - Group verbal and written instruction on the basics of heart failure: signs/symptoms, treatments, explanation of ejection fraction, enlarged heart and cardiomyopathy.   Sleep Apnea: - Individual verbal and written instruction to review Obstructive Sleep Apnea. Review of risk factors, methods for diagnosing and types of masks and machines for OSA.   Anxiety: - Provides group, verbal and written instruction on the correlation between heart/lung disease and anxiety, treatment options, and management of anxiety.   Relaxation: - Provides group, verbal and written instruction about the benefits of relaxation for patients with heart/lung disease. Also provides patients with examples of relaxation techniques.   Knowledge Questionnaire Score:     Knowledge Questionnaire Score - 01/15/16  1613      Knowledge Questionnaire Score   Pre Score 10/10       Core Components/Risk Factors/Patient Goals at Admission:     Personal Goals and Risk Factors at Admission - 01/15/16 1619      Core Components/Risk Factors/Patient Goals on Admission    Weight Management Yes;Obesity;Weight Loss   Intervention Weight Management: Develop a combined nutrition and exercise program designed to reach desired caloric intake, while maintaining appropriate intake of nutrient and fiber, sodium and fats, and appropriate energy expenditure required for the weight goal.;Weight Management: Provide education and appropriate resources to help participant work on and attain dietary goals.;Weight Management/Obesity: Establish reasonable short term and long term weight goals.;Obesity: Provide education and appropriate resources to help participant work on and attain dietary goals.   Admit Weight 358 lb (162.4 kg)   Goal Weight: Short Term 355 lb (161 kg)   Goal Weight: Long Term 300 lb (136.1 kg)   Expected Outcomes Long Term: Adherence to nutrition and physical activity/exercise program aimed toward attainment of established weight goal;Weight Maintenance: Understanding of the daily nutrition guidelines, which includes 25-35% calories from fat, 7% or less cal from saturated fats, less than 248m cholesterol, less than 1.5gm of sodium, & 5 or more servings of fruits and vegetables daily;Weight Loss: Understanding of general recommendations for a balanced deficit meal plan, which promotes 1-2 lb weight loss per week and includes a negative energy balance of [phone] kcal/d;Understanding recommendations for meals to include 15-35% energy as  protein, 25-35% energy from fat, 35-60% energy from carbohydrates, less than 243m of dietary cholesterol, 20-35 gm of total fiber daily;Understanding of distribution of calorie intake throughout the day with the consumption of 4-5 meals/snacks   Sedentary Yes   Intervention Provide  advice, education, support and counseling about physical activity/exercise needs.;Develop an individualized exercise prescription for aerobic and resistive training based on initial evaluation findings, risk stratification, comorbidities and participant'[name] personal goals.   Expected Outcomes Achievement of increased cardiorespiratory fitness and enhanced flexibility, muscular endurance and strength shown through measurements of functional capacity and personal statement of participant.   Increase Strength and Stamina Yes   Intervention Provide advice, education, support and counseling about physical activity/exercise needs.;Develop an individualized exercise prescription for aerobic and resistive training based on initial evaluation findings, risk stratification, comorbidities and participant'[name] personal goals.   Expected Outcomes Achievement of increased cardiorespiratory fitness and enhanced flexibility, muscular endurance and strength shown through measurements of functional capacity and personal statement of participant.   Improve shortness of breath with ADL'[name] Yes   Intervention Provide education, individualized exercise plan and daily activity instruction to help decrease symptoms of SOB with activities of daily living.   Expected Outcomes Short Term: Achieves a reduction of symptoms when performing activities of daily living.   Develop more efficient breathing techniques such as purse lipped breathing and diaphragmatic breathing; and practicing self-pacing with activity Yes   Intervention Provide education, demonstration and support about specific breathing techniuqes utilized for more efficient breathing. Include techniques such as pursed lipped breathing, diaphragmatic breathing and self-pacing activity.   Expected Outcomes Short Term: Participant will be able to demonstrate and use breathing techniques as needed throughout daily activities.   Increase knowledge of respiratory medications and  ability to use respiratory devices properly  Yes   Intervention Provide education and demonstration as needed of appropriate use of medications, inhalers, and oxygen therapy.   Expected Outcomes Short Term: Achieves understanding of medications use. Understands that oxygen is a medication prescribed by physician. Demonstrates appropriate use of inhaler and oxygen therapy.   Diabetes Yes   Intervention Provide education about signs/symptoms and action to take for hypo/hyperglycemia.;Provide education about proper nutrition, including hydration, and aerobic/resistive exercise prescription along with prescribed medications to achieve blood glucose in normal ranges: Fasting glucose 65-99 mg/dL   Expected Outcomes Short Term: Participant verbalizes understanding of the signs/symptoms and immediate care of hyper/hypoglycemia, proper foot care and importance of medication, aerobic/resistive exercise and nutrition plan for blood glucose control.;Long Term: Attainment of HbA1C < 7%.   Heart Failure Yes   Intervention Provide a combined exercise and nutrition program that is supplemented with education, support and counseling about heart failure. Directed toward relieving symptoms such as shortness of breath, decreased exercise tolerance, and extremity edema.   Expected Outcomes Improve functional capacity of life;Short term: Attendance in program 2-3 days a week with increased exercise capacity. Reported lower sodium intake. Reported increased fruit and vegetable intake. Reports medication compliance.;Short term: Daily weights obtained and reported for increase. Utilizing diuretic protocols set by physician.;Long term: Adoption of self-care skills and reduction of barriers for early signs and symptoms recognition and intervention leading to self-care maintenance.   Hypertension Yes   Intervention Provide education on lifestyle modifcations including regular physical activity/exercise, weight management, moderate  sodium restriction and increased consumption of fresh fruit, vegetables, and low fat dairy, alcohol moderation, and smoking cessation.;Monitor prescription use compliance.   Expected Outcomes Short Term: Continued assessment and intervention until BP is < 140/928mHG  in hypertensive participants. < 130/4m HG in hypertensive participants with diabetes, heart failure or chronic kidney disease.;Long Term: Maintenance of blood pressure at goal levels.   Lipids Yes   Intervention Provide education and support for participant on nutrition & aerobic/resistive exercise along with prescribed medications to achieve LDL <733m HDL >4052m  Expected Outcomes Short Term: Participant states understanding of desired cholesterol values and is compliant with medications prescribed. Participant is following exercise prescription and nutrition guidelines.;Long Term: Cholesterol controlled with medications as prescribed, with individualized exercise RX and with personalized nutrition plan. Value goals: LDL < 80m67mDL > 40 mg.      Core Components/Risk Factors/Patient Goals Review:      Goals and Risk Factor Review    Row Name 02/07/16 1515             Core Components/Risk Factors/Patient Goals Review   Personal Goals Review Sedentary;Increase Strength and Stamina;Develop more efficient breathing techniques such as purse lipped breathing and diaphragmatic breathing and practicing self-pacing with activity.       Review [name] attended 3 sessions of pulmonary rehab.  She has obtained her exercise goals on the BioStep and T5 and performed interval training on the treadmill. Her PLB technique is good and her vital signs are acceptable. She is compliant with her diabetes, lipids, and blood pressure medications.       Expected Outcomes Continue exercising in [facility] to increase stamina and strength.          Core Components/Risk Factors/Patient Goals at Discharge (Final Review):      Goals and Risk  Factor Review - 02/07/16 1515      Core Components/Risk Factors/Patient Goals Review   Personal Goals Review Sedentary;Increase Strength and Stamina;Develop more efficient breathing techniques such as purse lipped breathing and diaphragmatic breathing and practicing self-pacing with activity.   Review [name] attended 3 sessions of pulmonary rehab.  She has obtained her exercise goals on the BioStep and T5 and performed interval training on the treadmill. Her PLB technique is good and her vital signs are acceptable. She is compliant with her diabetes, lipids, and blood pressure medications.   Expected Outcomes Continue exercising in [facility] to increase stamina and strength.      ITP Comments:     ITP Comments    Row Name 02/12/16 1541 03/04/16 1242         ITP Comments Called [name] last attended 01/29/16. [name] been short of breath and has had a stomach virus. She is making an appointment with Dr RamaMontel Culver hopes to return to [facility]. CarlSrihitha called to see if she was planning to return to Pulmonary Rehab. She stated that she does want to return but is not sure when she will be able to come back. She has been in the hospital with pneumonia since she last attended and was given an antibiotic and is now home. She stated that she has been getting out of bed, walking around the house with her walker, and sitting in her chair. Will follow up with her again in a few weeks to see if she know when she will return.          Comments:  30 Day Note Review

## 2016-04-10 ENCOUNTER — Encounter: Payer: Self-pay | Admitting: Respiratory Therapy

## 2016-04-10 ENCOUNTER — Telehealth: Payer: Self-pay | Admitting: Respiratory Therapy

## 2016-04-10 DIAGNOSIS — I5032 Chronic diastolic (congestive) heart failure: Secondary | ICD-10-CM

## 2016-04-10 DIAGNOSIS — J45909 Unspecified asthma, uncomplicated: Secondary | ICD-10-CM

## 2016-04-10 NOTE — Telephone Encounter (Signed)
Called [name] - last attended 01/28/17. She had pneumonia in January and has had fluid retention recently. [facility] has been following her, and she has lost 35 lbs with extra lasix. [name] Michie has been moving around the house and getting up to her chair daily. Her plan is to return to [facility] the first of March.

## 2016-04-12 ENCOUNTER — Telehealth: Payer: Self-pay | Admitting: Family

## 2016-04-12 ENCOUNTER — Encounter: Payer: Self-pay | Admitting: Family

## 2016-04-12 ENCOUNTER — Ambulatory Visit: Payer: Medicaid Other | Attending: Family | Admitting: Family

## 2016-04-12 VITALS — BP 187/87 | Resp 24 | Ht 64.0 in | Wt 337.0 lb

## 2016-04-12 DIAGNOSIS — N183 Chronic kidney disease, stage 3 unspecified: Secondary | ICD-10-CM

## 2016-04-12 DIAGNOSIS — J410 Simple chronic bronchitis: Secondary | ICD-10-CM

## 2016-04-12 DIAGNOSIS — M7989 Other specified soft tissue disorders: Secondary | ICD-10-CM | POA: Insufficient documentation

## 2016-04-12 DIAGNOSIS — F419 Anxiety disorder, unspecified: Secondary | ICD-10-CM | POA: Diagnosis not present

## 2016-04-12 DIAGNOSIS — R0602 Shortness of breath: Secondary | ICD-10-CM | POA: Diagnosis not present

## 2016-04-12 DIAGNOSIS — E1122 Type 2 diabetes mellitus with diabetic chronic kidney disease: Secondary | ICD-10-CM

## 2016-04-12 DIAGNOSIS — Z5189 Encounter for other specified aftercare: Secondary | ICD-10-CM | POA: Diagnosis not present

## 2016-04-12 DIAGNOSIS — I10 Essential (primary) hypertension: Secondary | ICD-10-CM | POA: Insufficient documentation

## 2016-04-12 DIAGNOSIS — J449 Chronic obstructive pulmonary disease, unspecified: Secondary | ICD-10-CM | POA: Diagnosis not present

## 2016-04-12 DIAGNOSIS — Z79899 Other long term (current) drug therapy: Secondary | ICD-10-CM | POA: Insufficient documentation

## 2016-04-12 DIAGNOSIS — Z794 Long term (current) use of insulin: Secondary | ICD-10-CM

## 2016-04-12 DIAGNOSIS — I5032 Chronic diastolic (congestive) heart failure: Secondary | ICD-10-CM

## 2016-04-12 DIAGNOSIS — R5383 Other fatigue: Secondary | ICD-10-CM | POA: Diagnosis not present

## 2016-04-12 DIAGNOSIS — E119 Type 2 diabetes mellitus without complications: Secondary | ICD-10-CM | POA: Insufficient documentation

## 2016-04-12 LAB — BASIC METABOLIC PANEL
Anion gap: 9 (ref 5–15)
BUN: 20 mg/dL (ref 6–20)
CALCIUM: 8.1 mg/dL — AB (ref 8.9–10.3)
CO2: 39 mmol/L — ABNORMAL HIGH (ref 22–32)
CREATININE: 1.48 mg/dL — AB (ref 0.44–1.00)
Chloride: 90 mmol/L — ABNORMAL LOW (ref 101–111)
GFR, EST AFRICAN AMERICAN: 49 mL/min — AB (ref >60)
GFR, EST NON AFRICAN AMERICAN: 42 mL/min — AB (ref >60)
Glucose, Bld: 288 mg/dL — ABNORMAL HIGH (ref 65–99)
Potassium: 3.5 mmol/L (ref 3.5–5.1)
SODIUM: 138 mmol/L (ref 135–145)

## 2016-04-12 MED ORDER — METOLAZONE 5 MG PO TABS: 5.0000 mg | ORAL_TABLET | Freq: Every day | ORAL | 0 refills | Status: DC

## 2016-04-12 MED ORDER — POTASSIUM CHLORIDE CRYS ER 10 MEQ PO TBCR: 10.0000 meq | EXTENDED_RELEASE_TABLET | Freq: Two times a day (BID) | ORAL | 5 refills | Status: DC

## 2016-04-12 NOTE — Telephone Encounter (Signed)
Advised patient of lab results that were drawn today. Advised her that her renal function was improving with the loss of fluids. Also advised that her potassium level is normal although on the low end at 3.5.  Will continue metolazone 5mg  daily for an additional 7 days of medication called in. Also advised her to take two additional potassium tablets when she takes the metolazone. Told her that she could take the metolazone every other day.   Will recheck lab work at her next visit on 04/15/16.

## 2016-04-12 NOTE — Patient Instructions (Addendum)
Continue weighing daily and call for an overnight weight gain of > 2 pounds or a weekly weight gain of >5 pounds.  Please call [phone] to set up a Pulmonary Function Test. Once that is scheduled call [phone] to set up an appointment with Dr. Ashby Dawes.

## 2016-04-12 NOTE — Progress Notes (Signed)
Patient ID: [name], female    DOB: [birth year], 46 y.o.   MRN: [id number]  HPI  [name] is a 46 y/o female with a history of DM, iron deficiency anemia, HTN, COPD with chronic oxygen use, asthma, obesity and chronic heart failure.   Last echo was done 12/15/14 and showed an EF of 50-55% without any valvular regurgitation.   Was recently in the ED on 03/23/16 with exacerbation of HF. Was treated and released. Prior ED visit was 02/23/16 for acute cystitis.  She presents today for a follow-up visit with fatigue and shortness of breath which she feels like is improving. She says that she has been able to get up and move around more than previously. Continues to have lower extremity swelling but says that her legs/abdomen aren't as tight feeling. Continues to weigh and has noticed a gradual weight loss at home. Continues to have some issues with anxiety.   Past Medical History:  Diagnosis Date  . Asthma   . CHF (congestive heart failure) ([place])   . COPD (chronic obstructive pulmonary disease) ([place])   . Diabetes mellitus without complication ([place])   . Hypertension   . Iron deficiency anemia   . Pericardial effusion    a. 11/2014 CT Chest: Mod effusion.  [name] Poorly controlled diabetes mellitus ([place])    a. 11/2014 A1c 13.2.   Past Surgical History:  Procedure Laterality Date  . CESAREAN SECTION     Family History  Problem Relation Age of Onset  . Diabetes Mother   . Hypertension Mother   . Hypertension Father   . Diabetes Father   . Hypertension    . Diabetes    . Breast cancer Maternal Aunt 36  . Breast cancer Maternal Aunt 99   Social History  Substance Use Topics  . Smoking status: Never Smoker  . Smokeless tobacco: Never Used  . Alcohol use 0.0 oz/week     Comment: occ   Allergies  Allergen Reactions  . Feraheme [Ferumoxytol] Itching  . Sulfa Antibiotics Itching   Prior to Admission medications   Medication Sig Start Date End Date Taking? Authorizing Provider   acetaminophen (TYLENOL) 500 MG tablet Take 1,000 mg by mouth every 6 (six) hours as needed for mild pain, fever or headache.   Yes Historical Provider, MD  albuterol (PROVENTIL HFA;VENTOLIN HFA) 108 (90 BASE) MCG/ACT inhaler Inhale 2 puffs into the lungs every 6 (six) hours as needed for wheezing or shortness of breath. 12/19/14  Yes Dustin Flock, MD  amLODipine (NORVASC) 10 MG tablet Take 1 tablet (10 mg total) by mouth daily. 12/19/14  Yes Dustin Flock, MD  aspirin EC 81 MG tablet Take 81 mg by mouth at bedtime.   Yes Historical Provider, MD  budesonide (PULMICORT) 0.25 MG/2ML nebulizer solution Take 2 mLs (0.25 mg total) by nebulization 2 (two) times daily. 12/19/14  Yes Dustin Flock, MD  citalopram (CELEXA) 40 MG tablet Take 40 mg by mouth daily.   Yes Historical Provider, MD  guaiFENesin-dextromethorphan (ROBITUSSIN DM) 100-10 MG/5ML syrup Take 5 mLs by mouth every 4 (four) hours as needed for cough. 08/13/15  Yes Demetrios Loll, MD  hydrALAZINE (APRESOLINE) 100 MG tablet Take 1 tablet (100 mg total) by mouth every 8 (eight) hours. 12/19/14  Yes Dustin Flock, MD  insulin aspart (NOVOLOG FLEXPEN) 100 UNIT/ML FlexPen Inject 0-12 Units into the skin 3 (three) times daily with meals as needed for high blood sugar. Pt uses as needed per sliding scale.  Yes Historical Provider, MD  Insulin Degludec (TRESIBA FLEXTOUCH) 100 UNIT/ML SOPN Inject 45 Units into the skin at bedtime. 08/13/15  Yes Demetrios Loll, MD  ipratropium-albuterol (DUONEB) 0.5-2.5 (3) MG/3ML SOLN Take 3 mLs by nebulization every 6 (six) hours as needed (for wheezing/shortness of breath).   Yes Historical Provider, MD  losartan (COZAAR) 50 MG tablet Take 1 tablet (50 mg total) by mouth daily. 03/25/16  Yes Alisa Graff, FNP  metFORMIN (GLUCOPHAGE) 500 MG tablet Take 500 mg by mouth 2 (two) times daily with a meal.   Yes Historical Provider, MD  metoprolol (LOPRESSOR) 50 MG tablet Take 50 mg by mouth 2 (two) times daily.   Yes Historical  Provider, MD  potassium chloride (K-DUR,KLOR-CON) 10 MEQ tablet Take 1 tablet (10 mEq total) by mouth 2 (two) times daily. Can take an additional tablet when takes the metolazone 04/12/16  Yes Alisa Graff, FNP  simvastatin (ZOCOR) 40 MG tablet Take 40 mg by mouth at bedtime.   Yes Historical Provider, MD  torsemide (DEMADEX) 20 MG tablet Take 2 tablets (40 mg total) by mouth daily. 03/25/16  Yes Alisa Graff, FNP  metolazone (ZAROXOLYN) 5 MG tablet Take 1 tablet (5 mg total) by mouth daily. 04/12/16 04/19/16  Alisa Graff, FNP    Review of Systems  Constitutional: Positive for appetite change and fatigue.  HENT: Negative for congestion, postnasal drip and sore throat.   Respiratory: Positive for cough, shortness of breath (better), wheezing (sometimes) and stridor. Negative for chest tightness.   Cardiovascular: Positive for leg swelling. Negative for chest pain and palpitations.  Gastrointestinal: Positive for abdominal distention. Negative for abdominal pain.  Endocrine: Negative.   Genitourinary: Negative.   Musculoskeletal: Positive for back pain. Negative for neck pain.  Skin: Negative.   Allergic/Immunologic: Negative.   Neurological: Negative for dizziness and light-headedness.  Hematological: Negative for adenopathy. Does not bruise/bleed easily.  Psychiatric/Behavioral: Positive for dysphoric mood. Negative for sleep disturbance (wearing 0xygen at 4L). The patient is not nervous/anxious.    Vitals:   04/12/16 1010  BP: (!) 187/87  Resp: (!) 24  Weight: (!) 337 lb (152.9 kg)  Height: 5\' 4"  (1.626 m)   Wt Readings from Last 3 Encounters:  04/12/16 (!) 337 lb (152.9 kg)  03/29/16 (!) 364 lb 4 oz (165.2 kg)  03/25/16 (!) 376 lb (170.6 kg)   Lab Results  Component Value Date   CREATININE 1.48 (H) 04/12/2016   CREATININE 1.69 (H) 03/29/2016   CREATININE 1.88 (H) 03/23/2016   Physical Exam  Constitutional: She is oriented to person, place, and time. She appears  well-developed and well-nourished.  HENT:  Head: Normocephalic and atraumatic.  Eyes: Conjunctivae are normal. Pupils are equal, round, and reactive to light.  Neck: Normal range of motion. Neck supple. No JVD present.  Cardiovascular: Regular rhythm.  Tachycardia present.   Pulmonary/Chest: Effort normal. She has no wheezes. She has rales in the left lower field.  Abdominal: Soft. She exhibits no distension. There is no tenderness.  Musculoskeletal: She exhibits edema (3+ pitting edema in bilateral lower legs with R>L). She exhibits no tenderness.  Neurological: She is alert and oriented to person, place, and time.  Skin: Skin is warm and dry.  Psychiatric: She has a normal mood and affect. Her behavior is normal. Thought content normal.     sessment & Plan:  1: Chronic heart failure with preserved ejection fraction- - NYHA class III - continues to be severely fluid overloaded today -  weight down 27 pounds since 03/29/16. Continue weighing daily and call for a weight gain of >2 pounds, weekly weight gain of > 5 pounds, worsening swelling or worsening shortness of breath - continue daily torsemide - will check a BMP today and depending on results, will have her take metolazone again for 7 days. Can take it daily or every other day depending on her activity - last saw cardiologist Rockey Situ) 12/25/15. Needs echo repeated. Has an appointment scheduled with him on 06/17/16.  2: COPD- - initial O2 sat was 86% with her oxygen at 4L but with pulse setting. She wears it as continuous when at home but goes to pulse setting when out to conserve her tank. Placed on our tank at 4L continuous and she quickly rose to 92-96% - patient has to make an appointment to have pulmonary function tests completed before an appointment can be made with pulmonologist Ashby Dawes). Last was seen 01/04/16 - had to suspend attending pulmonary rehab  3: HTN- - BP little elevated today  4: Diabetes- - glucose recently  was 158   5: Anxiety- - patient says that she's anxious often and would like to try a different medication - psychiatry referral made  Patient did not bring her medications nor a list. Each medication was verbally reviewed with the patient and she was encouraged to bring the bottles to every visit to confirm accuracy of list.  Return here in 2 weeks or sooner for any questions/problems before then.

## 2016-04-26 ENCOUNTER — Encounter: Payer: Self-pay | Admitting: Family

## 2016-04-26 ENCOUNTER — Encounter: Payer: Medicaid Other | Attending: Internal Medicine

## 2016-04-26 ENCOUNTER — Ambulatory Visit: Payer: Medicaid Other | Attending: Family | Admitting: Family

## 2016-04-26 VITALS — BP 120/70 | HR 80 | Resp 20 | Ht 64.0 in | Wt 328.2 lb

## 2016-04-26 DIAGNOSIS — J449 Chronic obstructive pulmonary disease, unspecified: Secondary | ICD-10-CM | POA: Insufficient documentation

## 2016-04-26 DIAGNOSIS — E669 Obesity, unspecified: Secondary | ICD-10-CM | POA: Insufficient documentation

## 2016-04-26 DIAGNOSIS — R634 Abnormal weight loss: Secondary | ICD-10-CM | POA: Insufficient documentation

## 2016-04-26 DIAGNOSIS — I10 Essential (primary) hypertension: Secondary | ICD-10-CM

## 2016-04-26 DIAGNOSIS — D509 Iron deficiency anemia, unspecified: Secondary | ICD-10-CM | POA: Diagnosis not present

## 2016-04-26 DIAGNOSIS — Z9981 Dependence on supplemental oxygen: Secondary | ICD-10-CM | POA: Insufficient documentation

## 2016-04-26 DIAGNOSIS — E119 Type 2 diabetes mellitus without complications: Secondary | ICD-10-CM | POA: Insufficient documentation

## 2016-04-26 DIAGNOSIS — I110 Hypertensive heart disease with heart failure: Secondary | ICD-10-CM | POA: Diagnosis not present

## 2016-04-26 DIAGNOSIS — M7989 Other specified soft tissue disorders: Secondary | ICD-10-CM | POA: Diagnosis not present

## 2016-04-26 DIAGNOSIS — F419 Anxiety disorder, unspecified: Secondary | ICD-10-CM | POA: Insufficient documentation

## 2016-04-26 DIAGNOSIS — I5032 Chronic diastolic (congestive) heart failure: Secondary | ICD-10-CM | POA: Insufficient documentation

## 2016-04-26 DIAGNOSIS — N3000 Acute cystitis without hematuria: Secondary | ICD-10-CM | POA: Insufficient documentation

## 2016-04-26 DIAGNOSIS — Z794 Long term (current) use of insulin: Secondary | ICD-10-CM | POA: Diagnosis not present

## 2016-04-26 DIAGNOSIS — I509 Heart failure, unspecified: Secondary | ICD-10-CM | POA: Insufficient documentation

## 2016-04-26 DIAGNOSIS — E1122 Type 2 diabetes mellitus with diabetic chronic kidney disease: Secondary | ICD-10-CM

## 2016-04-26 DIAGNOSIS — N183 Chronic kidney disease, stage 3 (moderate): Secondary | ICD-10-CM

## 2016-04-26 DIAGNOSIS — J410 Simple chronic bronchitis: Secondary | ICD-10-CM

## 2016-04-26 NOTE — Progress Notes (Signed)
Patient ID: [name], female    DOB: April 04, 1970, 46 y.o.   MRN: [id number]  HPI  [name] is a 46 y/o female with a history of DM, iron deficiency anemia, HTN, COPD with chronic oxygen use, asthma, obesity and chronic heart failure.   Last echo was done 12/15/14 and showed an EF of 50-55% without any valvular regurgitation.   Was recently in the ED on 03/23/16 with exacerbation of HF. Was treated and released. Prior ED visit was 02/23/16 for acute cystitis.  She presents today for a follow-up visit with fatigue and shortness of breath which she feels like is continuing to improve.  She says that she has been able to get up and move around more than previously. Continues to have lower extremity swelling but says that her legs/abdomen aren't as tight feeling. Continues to weigh and has noticed a gradual weight loss at home. Only took 3 days of metolazone since she was last here so has 4 more doses left. Did take extra potassium when she took the metolazone. Continues to have some issues with anxiety and has not heard from psychiatrist.   Past Medical History:  Diagnosis Date  . Asthma   . CHF (congestive heart failure) ([place])   . COPD (chronic obstructive pulmonary disease) ([place])   . Diabetes mellitus without complication ([place])   . Hypertension   . Iron deficiency anemia   . Pericardial effusion    a. 11/2014 CT Chest: Mod effusion.  [name] Poorly controlled diabetes mellitus ([place])    a. 11/2014 A1c 13.2.   Past Surgical History:  Procedure Laterality Date  . CESAREAN SECTION     Family History  Problem Relation Age of Onset  . Diabetes Mother   . Hypertension Mother   . Hypertension Father   . Diabetes Father   . Hypertension    . Diabetes    . Breast cancer Maternal Aunt 70  . Breast cancer Maternal Aunt 13   Social History  Substance Use Topics  . Smoking status: Never Smoker  . Smokeless tobacco: Never Used  . Alcohol use 0.0 oz/week     Comment: occ   Allergies   Allergen Reactions  . Feraheme [Ferumoxytol] Itching  . Sulfa Antibiotics Itching   Prior to Admission medications   Medication Sig Start Date End Date Taking? Authorizing Provider  acetaminophen (TYLENOL) 500 MG tablet Take 1,000 mg by mouth every 6 (six) hours as needed for mild pain, fever or headache.   Yes Historical Provider, MD  albuterol (PROVENTIL HFA;VENTOLIN HFA) 108 (90 BASE) MCG/ACT inhaler Inhale 2 puffs into the lungs every 6 (six) hours as needed for wheezing or shortness of breath. 12/19/14  Yes Dustin Flock, MD  amLODipine (NORVASC) 10 MG tablet Take 1 tablet (10 mg total) by mouth daily. 12/19/14  Yes Dustin Flock, MD  aspirin EC 81 MG tablet Take 81 mg by mouth at bedtime.   Yes Historical Provider, MD  budesonide (PULMICORT) 0.25 MG/2ML nebulizer solution Take 2 mLs (0.25 mg total) by nebulization 2 (two) times daily. 12/19/14  Yes Dustin Flock, MD  citalopram (CELEXA) 40 MG tablet Take 40 mg by mouth daily.   Yes Historical Provider, MD  guaiFENesin-dextromethorphan (ROBITUSSIN DM) 100-10 MG/5ML syrup Take 5 mLs by mouth every 4 (four) hours as needed for cough. 08/13/15  Yes Demetrios Loll, MD  hydrALAZINE (APRESOLINE) 100 MG tablet Take 1 tablet (100 mg total) by mouth every 8 (eight) hours. 12/19/14  Yes Dustin Flock, MD  insulin aspart (NOVOLOG FLEXPEN) 100 UNIT/ML FlexPen Inject 0-12 Units into the skin 3 (three) times daily with meals as needed for high blood sugar. Pt uses as needed per sliding scale.   Yes Historical Provider, MD  Insulin Degludec (TRESIBA FLEXTOUCH) 100 UNIT/ML SOPN Inject 45 Units into the skin at bedtime. 08/13/15  Yes Demetrios Loll, MD  ipratropium-albuterol (DUONEB) 0.5-2.5 (3) MG/3ML SOLN Take 3 mLs by nebulization every 6 (six) hours as needed (for wheezing/shortness of breath).   Yes Historical Provider, MD  losartan (COZAAR) 50 MG tablet Take 1 tablet (50 mg total) by mouth daily. 03/25/16  Yes Alisa Graff, FNP  metoprolol (LOPRESSOR) 50 MG  tablet Take 50 mg by mouth 2 (two) times daily.   Yes Historical Provider, MD  potassium chloride (K-DUR,KLOR-CON) 10 MEQ tablet Take 1 tablet (10 mEq total) by mouth 2 (two) times daily. Can take an additional tablet when takes the metolazone 04/12/16  Yes Alisa Graff, FNP  simvastatin (ZOCOR) 40 MG tablet Take 40 mg by mouth at bedtime.   Yes Historical Provider, MD  torsemide (DEMADEX) 20 MG tablet Take 2 tablets (40 mg total) by mouth daily. 03/25/16  Yes Alisa Graff, FNP  metFORMIN (GLUCOPHAGE) 500 MG tablet Take 500 mg by mouth 2 (two) times daily with a meal.    Historical Provider, MD    Review of Systems  Constitutional: Positive for fatigue. Negative for appetite change.  HENT: Positive for congestion and rhinorrhea. Negative for sore throat.   Eyes: Negative.   Respiratory: Positive for cough, shortness of breath and wheezing. Negative for chest tightness.   Cardiovascular: Positive for leg swelling (better). Negative for chest pain and palpitations.  Gastrointestinal: Positive for abdominal distention. Negative for abdominal pain.  Endocrine: Negative.   Genitourinary: Negative.   Musculoskeletal: Positive for arthralgias ("all over"). Negative for back pain.  Skin: Negative.   Allergic/Immunologic: Negative.   Neurological: Positive for light-headedness. Negative for dizziness.  Hematological: Does not bruise/bleed easily.  Psychiatric/Behavioral: Positive for dysphoric mood and sleep disturbance (wearing oxygen at 3L continously; unsure of why difficulty sleeping). Negative for suicidal ideas. The patient is not nervous/anxious.    Vitals:   04/26/16 1241  BP: 120/70  Pulse: 80  Resp: 20  SpO2: (!) 89%  Weight: (!) 328 lb 4 oz (148.9 kg)  Height: 5\' 4"  (1.626 m)   Wt Readings from Last 3 Encounters:  04/26/16 (!) 328 lb 4 oz (148.9 kg)  04/12/16 (!) 337 lb (152.9 kg)  03/29/16 (!) 364 lb 4 oz (165.2 kg)    Lab Results  Component Value Date   CREATININE 1.48  (H) 04/12/2016   CREATININE 1.69 (H) 03/29/2016   CREATININE 1.88 (H) 03/23/2016     Physical Exam  Constitutional: She is oriented to person, place, and time. She appears well-developed and well-nourished.  HENT:  Head: Normocephalic and atraumatic.  Eyes: Conjunctivae are normal. Pupils are equal, round, and reactive to light.  Neck: Normal range of motion. Neck supple.  Cardiovascular: Normal rate and regular rhythm.   Pulmonary/Chest: Effort normal. She has wheezes in the right upper field. She has no rales.  Abdominal: Soft. She exhibits distension. There is no tenderness.  Musculoskeletal: She exhibits edema (1+ pitting edema in bilateral lower legs). She exhibits no tenderness.  Neurological: She is alert and oriented to person, place, and time.  Skin: Skin is warm and dry.  Psychiatric: She has a normal mood and affect. Her behavior is normal. Thought content  normal.  Nursing note and vitals reviewed.   Assessment & Plan:  1: Chronic heart failure with preserved ejection fraction- - NYHA class III - continues to be fluid overloaded today but less edema - weight down another 9 pounds since she was last here. Total weight loss since 03/25/16 is 47.6 pounds. Continue weighing daily and call for a weight gain of >2 pounds, weekly weight gain of > 5 pounds, worsening swelling or worsening shortness of breath - continue daily torsemide - has 4 doses of metolazone left and she was instructed to take it for above parameters and if she needs to take it, she knows to take an additional potassium tablet with it - patient getting lab work drawn by her PCP today and pre-admit testing is already closed for today. Will call next week to get results sent to [place].  - last saw cardiologist Rockey Situ) 12/25/15. Needs echo repeated. Has an appointment scheduled with him on 06/17/16.  2: COPD- - initial O2 sat was 89% with her oxygen at 3L but with pulse setting. She wears it as continuous when at home  but goes to pulse setting when out to conserve her tank.  - patient has to make an appointment to have pulmonary function tests completed before an appointment can be made with pulmonologist Ashby Dawes). Last was seen 01/04/16 - had to suspend attending pulmonary rehab - slight wheezing heard RUL  3: HTN- - BP looks good today - sees PCP on 04/29/16  4: Diabetes- - glucose recently was 195  5: Anxiety- - patient says that she's anxious often and would like to try a different medication - psychiatry referral had been made but patient hadn't heard anything. CMA called to follow-up but the office was already close.  Patient did not bring her medications nor a list. Each medication was verbally reviewed with the patient and she was encouraged to bring the bottles to every visit to confirm accuracy of list.  Return here in 1 month or sooner for any questions/problems before then.

## 2016-04-26 NOTE — Patient Instructions (Addendum)
Continue weighing daily and call for an overnight weight gain of > 2 pounds or a weekly weight gain of >5 pounds.   Today 04/26/2016 you had BMP drawn.

## 2016-04-29 ENCOUNTER — Ambulatory Visit
Admission: RE | Admit: 2016-04-29 | Discharge: 2016-04-29 | Disposition: A | Discharge status: Home / Self Care | Payer: Medicaid Other | Source: Ambulatory Visit | Attending: Nurse Practitioner | Admitting: Nurse Practitioner

## 2016-04-29 DIAGNOSIS — N63 Unspecified lump in unspecified breast: Secondary | ICD-10-CM

## 2016-04-29 DIAGNOSIS — N6310 Unspecified lump in the right breast, unspecified quadrant: Secondary | ICD-10-CM | POA: Diagnosis present

## 2016-04-30 ENCOUNTER — Other Ambulatory Visit: Payer: Self-pay | Admitting: Nurse Practitioner

## 2016-04-30 DIAGNOSIS — N6310 Unspecified lump in the right breast, unspecified quadrant: Secondary | ICD-10-CM

## 2016-05-06 ENCOUNTER — Encounter: Payer: Self-pay | Admitting: *Deleted

## 2016-05-06 ENCOUNTER — Telehealth: Payer: Self-pay | Admitting: Internal Medicine

## 2016-05-06 DIAGNOSIS — I509 Heart failure, unspecified: Secondary | ICD-10-CM

## 2016-05-06 DIAGNOSIS — I5032 Chronic diastolic (congestive) heart failure: Secondary | ICD-10-CM

## 2016-05-06 DIAGNOSIS — J984 Other disorders of lung: Secondary | ICD-10-CM

## 2016-05-06 DIAGNOSIS — J45909 Unspecified asthma, uncomplicated: Secondary | ICD-10-CM

## 2016-05-06 NOTE — Progress Notes (Signed)
Pulmonary Individual Treatment Plan  Patient Details  Name: [name] MRN: [id number] Date of Birth: 11/13/1970 Referring Provider:   Flowsheet Row Pulmonary Rehab from 01/15/2016 in [facility] Cardiac and Pulmonary Rehab  Referring Provider  Ramashandran MD      Initial Encounter Date:  Flowsheet Row Pulmonary Rehab from 01/15/2016 in [facility] Cardiac and Pulmonary Rehab  Date  01/15/16  Referring Provider  Ramashandran MD      Visit Diagnosis: Asthma, unspecified asthma severity, unspecified whether complicated, unspecified whether persistent  Chronic diastolic congestive heart failure ([place])  Morbid obesity, unspecified obesity type ([place])  Congestive heart failure, unspecified congestive heart failure chronicity, unspecified congestive heart failure type ([place])  [name] Home Medications on Admission:  Current Outpatient Prescriptions:  .  acetaminophen (TYLENOL) 500 MG tablet, Take 1,000 mg by mouth every 6 (six) hours as needed for mild pain, fever or headache., Disp: , Rfl:  .  albuterol (PROVENTIL HFA;VENTOLIN HFA) 108 (90 BASE) MCG/ACT inhaler, Inhale 2 puffs into the lungs every 6 (six) hours as needed for wheezing or shortness of breath., Disp: 1 Inhaler, Rfl: 0 .  amLODipine (NORVASC) 10 MG tablet, Take 1 tablet (10 mg total) by mouth daily., Disp: 30 tablet, Rfl: 0 .  aspirin EC 81 MG tablet, Take 81 mg by mouth at bedtime., Disp: , Rfl:  .  budesonide (PULMICORT) 0.25 MG/2ML nebulizer solution, Take 2 mLs (0.25 mg total) by nebulization 2 (two) times daily., Disp: 60 mL, Rfl: 12 .  citalopram (CELEXA) 40 MG tablet, Take 40 mg by mouth daily., Disp: , Rfl:  .  guaiFENesin-dextromethorphan (ROBITUSSIN DM) 100-10 MG/5ML syrup, Take 5 mLs by mouth every 4 (four) hours as needed for cough., Disp: 118 mL, Rfl: 0 .  hydrALAZINE (APRESOLINE) 100 MG tablet, Take 1 tablet (100 mg total) by mouth every 8 (eight) hours., Disp: 60 tablet, Rfl: 0 .  insulin aspart (NOVOLOG FLEXPEN) 100  UNIT/ML FlexPen, Inject 0-12 Units into the skin 3 (three) times daily with meals as needed for high blood sugar. Pt uses as needed per sliding scale., Disp: , Rfl:  .  Insulin Degludec (TRESIBA FLEXTOUCH) 100 UNIT/ML SOPN, Inject 45 Units into the skin at bedtime., Disp: 1 pen, Rfl: 2 .  ipratropium-albuterol (DUONEB) 0.5-2.5 (3) MG/3ML SOLN, Take 3 mLs by nebulization every 6 (six) hours as needed (for wheezing/shortness of breath)., Disp: , Rfl:  .  losartan (COZAAR) 50 MG tablet, Take 1 tablet (50 mg total) by mouth daily., Disp: 30 tablet, Rfl: 5 .  metFORMIN (GLUCOPHAGE) 500 MG tablet, Take 500 mg by mouth 2 (two) times daily with a meal., Disp: , Rfl:  .  metoprolol (LOPRESSOR) 50 MG tablet, Take 50 mg by mouth 2 (two) times daily., Disp: , Rfl:  .  potassium chloride (K-DUR,KLOR-CON) 10 MEQ tablet, Take 1 tablet (10 mEq total) by mouth 2 (two) times daily. Can take an additional tablet when takes the metolazone, Disp: 80 tablet, Rfl: 5 .  simvastatin (ZOCOR) 40 MG tablet, Take 40 mg by mouth at bedtime., Disp: , Rfl:  .  torsemide (DEMADEX) 20 MG tablet, Take 2 tablets (40 mg total) by mouth daily., Disp: 60 tablet, Rfl: 5  Past Medical History: Past Medical History:  Diagnosis Date  . Asthma   . CHF (congestive heart failure) ([place])   . COPD (chronic obstructive pulmonary disease) ([place])   . Diabetes mellitus without complication ([place])   . Hypertension   . Iron deficiency anemia   . Pericardial effusion  a. 11/2014 CT Chest: Mod effusion.  [name] Poorly controlled diabetes mellitus ([place])    a. 11/2014 A1c 13.2.    Tobacco Use: History  Smoking Status  . Never Smoker  Smokeless Tobacco  . Never Used    Labs: Recent Review Flowsheet Data    Labs for ITP Cardiac and Pulmonary Rehab Latest Ref Rng & Units 12/15/2014 01/03/2015 08/09/2015 02/23/2016   Cholestrol 0 - 200 mg/dL - 232(H) - -   LDLCALC 0 - 99 mg/dL - 169(H) - -   HDL >40 mg/dL - 41 - -   Trlycerides <150 mg/dL - 112 -  -   Hemoglobin A1c 4.0 - 6.0 % 13.2(H) - 10.7(H) -   HCO3 20.0 - 28.0 mmol/L - - 36.4(H) 38.0(H)   O2SAT % - - 80.3 84.3       ADL UCSD:     Pulmonary Assessment Scores    Row Name 01/15/16 1615         ADL UCSD   ADL Phase Entry     SOB Score total 118     Rest 3     Walk 5     Stairs 5     Bath 5     Dress 5     Shop 5        Pulmonary Function Assessment:     Pulmonary Function Assessment - 01/15/16 1614      Pulmonary Function Tests   FVC% 27 %   FEV1% 26 %   FEV1/FVC Ratio 79.61     Initial Spirometry Results   Comments Test date 01/15/16     Breath   Bilateral Breath Sounds Clear;Decreased   Shortness of Breath Yes;Limiting activity;Panic with Shortness of Breath;Fear of Shortness of Breath      Exercise Target Goals:    Exercise Program Goal: Individual exercise prescription set with THRR, safety & activity barriers. Participant demonstrates ability to understand and report RPE using BORG scale, to self-measure pulse accurately, and to acknowledge the importance of the exercise prescription.  Exercise Prescription Goal: Starting with aerobic activity 30 plus minutes a day, 3 days per week for initial exercise prescription. Provide home exercise prescription and guidelines that participant acknowledges understanding prior to discharge.  Activity Barriers & Risk Stratification:     Activity Barriers & Cardiac Risk Stratification - 01/15/16 1613      Activity Barriers & Cardiac Risk Stratification   Activity Barriers Joint Problems;Deconditioning;Muscular Weakness;Shortness of Breath;Assistive Device;Balance Concerns  also has a fear of falling, uses rollator at home and was encouraged to bring it to rehab.   Cardiac Risk Stratification Moderate      6 Minute Walk:     6 Minute Walk    Row Name 01/15/16 1637         6 Minute Walk   Phase Initial     Distance 140 feet     Walk Time 2.33 minutes     # of Rest Breaks 2  1:26, stopped  at 2:56      MPH 0.68     METS 1.5     RPE 12     Perceived Dyspnea  5     VO2 Peak 1.99     Symptoms Yes (comment)     Comments hurting pain in legs and back (8/10), anxiety about falling     Resting HR 83 bpm     Resting BP 166/60     Max Ex. HR 90 bpm  Max Ex. BP 152/54     2 Minute Post BP 134/60       Interval HR   Baseline HR 83     1 Minute HR 85     2 Minute HR 84     3 Minute HR 90     4 Minute HR 86     5 Minute HR 84     6 Minute HR 84     2 Minute Post HR 82     Interval Heart Rate? Yes       Interval Oxygen   Interval Oxygen? Yes     Baseline Oxygen Saturation % 96 %  was 88% on her pulsed 3L     Baseline Liters of Oxygen 4 L  switched to e-cylinder continuous flow     1 Minute Oxygen Saturation % 95 %  rest break 30-1:56     1 Minute Liters of Oxygen 4 L     2 Minute Oxygen Saturation % 94 %     2 Minute Liters of Oxygen 4 L     3 Minute Oxygen Saturation % 94 %  pt sat down at 2:56     3 Minute Liters of Oxygen 4 L     4 Minute Oxygen Saturation % 96 %     4 Minute Liters of Oxygen 4 L     5 Minute Oxygen Saturation % 98 %     5 Minute Liters of Oxygen 4 L     6 Minute Oxygen Saturation % 100 %     6 Minute Liters of Oxygen 4 L     2 Minute Post Oxygen Saturation % 100 %     2 Minute Post Liters of Oxygen 4 L       Oxygen Initial Assessment:   Oxygen Re-Evaluation:   Oxygen Discharge (Final Oxygen Re-Evaluation):   Initial Exercise Prescription:     Initial Exercise Prescription - 01/15/16 1600      Date of Initial Exercise RX and Referring Provider   Date 01/15/16   Referring Provider Ramashandran MD     Oxygen   Oxygen Continuous   Liters 4     Treadmill   MPH 0.5   Grade 0   Minutes 3  1 min intervals   METs 1.5     T5 Nustep   Level 1   SPM --  60-80 spm   Minutes 15   METs 1.5     Biostep-RELP   Level 1   SPM --  40-50 spm   Minutes 15   METs 1     Prescription Details   Frequency (times per week) 3    Duration Progress to 45 minutes of aerobic exercise without signs/symptoms of physical distress     Intensity   THRR 40-80% of Max Heartrate 120-157   Ratings of Perceived Exertion 11-15   Perceived Dyspnea 0-4     Progression   Progression Continue to progress workloads to maintain intensity without signs/symptoms of physical distress.     Resistance Training   Training Prescription Yes   Weight 2 lbs   Reps 10-12      Perform Capillary Blood Glucose checks as needed.  Exercise Prescription Changes:     Exercise Prescription Changes    Row Name 01/22/16 1300 01/30/16 1400           Response to Exercise   Blood Pressure (Admit) 138/82 184/70      Blood  Pressure (Exercise) 168/88 166/82      Blood Pressure (Exit) 138/80 148/68      Heart Rate (Admit) 89 bpm 81 bpm      Heart Rate (Exercise) 95 bpm 83 bpm      Heart Rate (Exit) 86 bpm 82 bpm      Oxygen Saturation (Admit) 96 % 90 %      Oxygen Saturation (Exercise) 93 % 95 %      Oxygen Saturation (Exit) 100 % 99 %      Rating of Perceived Exertion (Exercise) 15 11      Perceived Dyspnea (Exercise) 3 3      Symptoms  - none      Duration Progress to 45 minutes of aerobic exercise without signs/symptoms of physical distress Progress to 45 minutes of aerobic exercise without signs/symptoms of physical distress      Intensity THRR unchanged THRR unchanged        Progression   Progression Continue to progress workloads to maintain intensity without signs/symptoms of physical distress. Continue to progress workloads to maintain intensity without signs/symptoms of physical distress.      Average METs  - 1.45        Resistance Training   Training Prescription Yes Yes      Weight 2 2      Reps 10-12 10-12        Interval Training   Interval Training No No        Oxygen   Oxygen Continuous Continuous      Liters 4 4        Treadmill   MPH 0.5 0.5      Grade 0 0      Minutes 3 3      METs  - 1.4        T5 Nustep    Level  - 1      Minutes  - 15      METs  - 1.5        Biostep-RELP   Level 1 1      Minutes 17 15      METs  - 1        Exercise Review   Progression No Yes         Exercise Comments:     Exercise Comments    Row Name 01/15/16 1642 01/30/16 1421 02/14/16 1520       Exercise Comments Lakela wants to get her independence back and to be able to get out more.  She has been spending a lot of time in her bed.  I talked with her and her husband about getting up and moving out to the living room to sit in the chair with her feet propped up to help start to increase her strength and stamina. Dalani's first day of exercise was 11/27.  She did great.  She has done well with her first three visits.  She has gotten out of the bed and into the chair in the living room.  We will continue to monitor her progression. Avice has been out since last review.        Exercise Goals and Review:   Exercise Goals Re-Evaluation :   Discharge Exercise Prescription (Final Exercise Prescription Changes):     Exercise Prescription Changes - 01/30/16 1400      Response to Exercise   Blood Pressure (Admit) 184/70   Blood Pressure (Exercise) 166/82   Blood Pressure (Exit) 148/68  Heart Rate (Admit) 81 bpm   Heart Rate (Exercise) 83 bpm   Heart Rate (Exit) 82 bpm   Oxygen Saturation (Admit) 90 %   Oxygen Saturation (Exercise) 95 %   Oxygen Saturation (Exit) 99 %   Rating of Perceived Exertion (Exercise) 11   Perceived Dyspnea (Exercise) 3   Symptoms none   Duration Progress to 45 minutes of aerobic exercise without signs/symptoms of physical distress   Intensity THRR unchanged     Progression   Progression Continue to progress workloads to maintain intensity without signs/symptoms of physical distress.   Average METs 1.45     Resistance Training   Training Prescription Yes   Weight 2   Reps 10-12     Interval Training   Interval Training No     Oxygen   Oxygen Continuous   Liters 4      Treadmill   MPH 0.5   Grade 0   Minutes 3   METs 1.4     T5 Nustep   Level 1   Minutes 15   METs 1.5     Biostep-RELP   Level 1   Minutes 15   METs 1     Exercise Review   Progression Yes      Nutrition:  Target Goals: Understanding of nutrition guidelines, daily intake of sodium <1586m, cholesterol <2053m calories 30% from fat and 7% or less from saturated fats, daily to have 5 or more servings of fruits and vegetables.  Biometrics:    Nutrition Therapy Plan and Nutrition Goals:   Nutrition Discharge: Rate Your Plate Scores:   Nutrition Goals Re-Evaluation:   Nutrition Goals Discharge (Final Nutrition Goals Re-Evaluation):   Psychosocial: Target Goals: Acknowledge presence or absence of significant depression and/or stress, maximize coping skills, provide positive support system. Participant is able to verbalize types and ability to use techniques and skills needed for reducing stress and depression.   Initial Review & Psychosocial Screening:     Initial Psych Review & Screening - 01/15/16 1623      Family Dynamics   Good Support System? Yes   Comments Ms ShVermetteas good support from her family and knows that she has to make changes to her life for a healthier life for her family and herself.     Barriers   Psychosocial barriers to participate in program The patient should benefit from training in stress management and relaxation.     Screening Interventions   Interventions Encouraged to exercise;Program counselor consult      Quality of Life Scores:     Quality of Life - 01/15/16 1625      Quality of Life Scores   Health/Function Pre 20.75 %   Socioeconomic Pre 20.43 %   Psych/Spiritual Pre 21 %   Family Pre 20.6 %   GLOBAL Pre 20.71 %      PHQ-9: Recent Review Flowsheet Data    [facility] 04/26/2016 04/12/2016 03/29/2016 03/25/2016 01/15/2016   Decreased Interest 3 1 1  0 2   Down, Depressed, Hopeless 1 0 1 2 1    PHQ -  2 Score 4 1 2 2 3    Altered sleeping 3 1 3 1 3    Tired, decreased energy 3 1 3 2 3    Change in appetite 1 1 1 1 2    Feeling bad or failure about yourself  1 0 1 3 2    Trouble concentrating 3 0 1 3 2    Moving slowly or fidgety/restless 0 -  1 1 1    Suicidal thoughts 0 0 0 0 1   PHQ-9 Score 15 4 12 13 17    Difficult doing work/chores Somewhat difficult Not difficult at all Very difficult - Very difficult     Interpretation of Total Score  Total Score Depression Severity:  1-4 = Minimal depression, 5-9 = Mild depression, 10-14 = Moderate depression, 15-19 = Moderately severe depression, 20-27 = Severe depression   Psychosocial Evaluation and Intervention:     Psychosocial Evaluation - 01/24/16 1223      Psychosocial Evaluation & Interventions   Interventions Stress management education;Relaxation education;Encouraged to exercise with the program and follow exercise prescription   Comments Counselor met with Ms. Jefferson Fuel (Ms. S) today for initial psychosocial evaluation.  She is a 46 year old who has multiple health issues including COPD; Type 2 Diabetes; CHF and Asthma.  She has a strong support system with a spouse of 15 years and (4) adult children - (2) of which live either in the home or close by.  Ms. Chauncey Cruel reports sleeping better since she went on Oxygen 6 months ago.  Her appetite is up and down - but mostly good.    She reports a history of anxiety and has been on a variety of medications for this and is currently on Celexa 40 mg.  Counselor discussed with Ms. S her PHQ-9 score of "17" and of this indicating moderately severe depression.  She denies SI/HI but does admit to a lot of stress in her life currently - particularly since she is unable to work and her spouse is on SSI so finances are extremely limited.  She is concerned about having the means to put gas in her car for these classes 3x/week.  Ms. Chauncey Cruel has goals to "get back up and moving around" since she admits to being bed-ridden for the  past year or so.  counselor encouraged her to come consistently - if possible for the best results.  And to speak with the nurses about transportation options that might be available.  Counselor also mentioned if mood does not improve with consistent exercise in the next few weeks, Ms. Chauncey Cruel may need to speak with her Dr. about increasing her medications for depression.  Counselor and staff will continue to follow with her throughout the course of this program.     Continue Psychosocial Services  Yes  Ms. Chauncey Cruel may need her Rx for depression increased if exercise does not help over the next few weeks.  She agreed.  Also, she may have barriers to attend with limited $$ for gasoline.        Psychosocial Re-Evaluation:   Psychosocial Discharge (Final Psychosocial Re-Evaluation):   Education: Education Goals: Education classes will be provided on a weekly basis, covering required topics. Participant will state understanding/return demonstration of topics presented.  Learning Barriers/Preferences:     Learning Barriers/Preferences - 01/15/16 1613      Learning Barriers/Preferences   Learning Barriers None   Learning Preferences None      Education Topics: Initial Evaluation Education: - Verbal, written and demonstration of respiratory meds, RPE/PD scales, oximetry and breathing techniques. Instruction on use of nebulizers and MDIs: cleaning and proper use, rinsing mouth with steroid doses and importance of monitoring MDI activations. Flowsheet Row Pulmonary Rehab from 01/29/2016 in [facility] Cardiac and Pulmonary Rehab  Date  01/15/16  Educator  LB  Instruction Review Code  2- meets goals/outcomes      General Nutrition Guidelines/Fats and Fiber: -Group instruction  provided by verbal, written material, models and posters to present the general guidelines for heart healthy nutrition. Gives an explanation and review of dietary fats and fiber.   Controlling Sodium/Reading Food Labels: -Group  verbal and written material supporting the discussion of sodium use in heart healthy nutrition. Review and explanation with models, verbal and written materials for utilization of the food label.   Exercise Physiology & Risk Factors: - Group verbal and written instruction with models to review the exercise physiology of the cardiovascular system and associated critical values. Details cardiovascular disease risk factors and the goals associated with each risk factor.   Aerobic Exercise & Resistance Training: - Gives group verbal and written discussion on the health impact of inactivity. On the components of aerobic and resistive training programs and the benefits of this training and how to safely progress through these programs.   Flexibility, Balance, General Exercise Guidelines: - Provides group verbal and written instruction on the benefits of flexibility and balance training programs. Provides general exercise guidelines with specific guidelines to those with heart or lung disease. Demonstration and skill practice provided.   Stress Management: - Provides group verbal and written instruction about the health risks of elevated stress, cause of high stress, and healthy ways to reduce stress.   Depression: - Provides group verbal and written instruction on the correlation between heart/lung disease and depressed mood, treatment options, and the stigmas associated with seeking treatment. Flowsheet Row Pulmonary Rehab from 01/29/2016 in [facility] Cardiac and Pulmonary Rehab  Date  01/29/16  Educator  [facility]  Instruction Review Code  2- meets goals/outcomes      Exercise & Equipment Safety: - Individual verbal instruction and demonstration of equipment use and safety with use of the equipment.   Infection Prevention: - Provides verbal and written material to individual with discussion of infection control including proper hand washing and proper equipment cleaning during exercise  session. Flowsheet Row Pulmonary Rehab from 01/29/2016 in [facility] Cardiac and Pulmonary Rehab  Date  01/15/16  Educator  LB  Instruction Review Code  2- meets goals/outcomes      Falls Prevention: - Provides verbal and written material to individual with discussion of falls prevention and safety. Flowsheet Row Pulmonary Rehab from 01/29/2016 in [facility] Cardiac and Pulmonary Rehab  Date  01/15/16  Educator  LB  Instruction Review Code  2- meets goals/outcomes      Diabetes: - Individual verbal and written instruction to review signs/symptoms of diabetes, desired ranges of glucose level fasting, after meals and with exercise. Advice that pre and post exercise glucose checks will be done for 3 sessions at entry of program.   Chronic Lung Diseases: - Group verbal and written instruction to review new updates, new respiratory medications, new advancements in procedures and treatments. Provide informative websites and "800" numbers of self-education. Flowsheet Row Pulmonary Rehab from 01/29/2016 in [facility] Cardiac and Pulmonary Rehab  Date  01/24/16  Educator  LB  Instruction Review Code  2- meets goals/outcomes      Lung Procedures: - Group verbal and written instruction to describe testing methods done to diagnose lung disease. Review the outcome of test results. Describe the treatment choices: Pulmonary Function Tests, ABGs and oximetry.   Energy Conservation: - Provide group verbal and written instruction for methods to conserve energy, plan and organize activities. Instruct on pacing techniques, use of adaptive equipment and posture/positioning to relieve shortness of breath.   Triggers: - Group verbal and written instruction to review types of environmental controls:  home humidity, furnaces, filters, dust mite/pet prevention, HEPA vacuums. To discuss weather changes, air quality and the benefits of nasal washing.   Exacerbations: - Group verbal and written instruction to provide:  warning signs, infection symptoms, calling MD promptly, preventive modes, and value of vaccinations. Review: effective airway clearance, coughing and/or vibration techniques. Create an Sports administrator.   Oxygen: - Individual and group verbal and written instruction on oxygen therapy. Includes supplement oxygen, available portable oxygen systems, continuous and intermittent flow rates, oxygen safety, concentrators, and Medicare reimbursement for oxygen. Flowsheet Row Pulmonary Rehab from 01/29/2016 in [facility] Cardiac and Pulmonary Rehab  Date  01/15/16  Educator  LB  Instruction Review Code  2- meets goals/outcomes      Respiratory Medications: - Group verbal and written instruction to review medications for lung disease. Drug class, frequency, complications, importance of spacers, rinsing mouth after steroid MDI's, and proper cleaning methods for nebulizers. Flowsheet Row Pulmonary Rehab from 01/29/2016 in [facility] Cardiac and Pulmonary Rehab  Date  01/15/16  Educator  LB  Instruction Review Code  2- meets goals/outcomes      AED/CPR: - Group verbal and written instruction with the use of models to demonstrate the basic use of the AED with the basic ABC's of resuscitation.   Breathing Retraining: - Provides individuals verbal and written instruction on purpose, frequency, and proper technique of diaphragmatic breathing and pursed-lipped breathing. Applies individual practice skills. Flowsheet Row Pulmonary Rehab from 01/29/2016 in [facility] Cardiac and Pulmonary Rehab  Date  01/15/16  Educator  LB  Instruction Review Code  2- meets goals/outcomes      Anatomy and Physiology of the Lungs: - Group verbal and written instruction with the use of models to provide basic lung anatomy and physiology related to function, structure and complications of lung disease.   Heart Failure: - Group verbal and written instruction on the basics of heart failure: signs/symptoms, treatments, explanation of ejection  fraction, enlarged heart and cardiomyopathy.   Sleep Apnea: - Individual verbal and written instruction to review Obstructive Sleep Apnea. Review of risk factors, methods for diagnosing and types of masks and machines for OSA.   Anxiety: - Provides group, verbal and written instruction on the correlation between heart/lung disease and anxiety, treatment options, and management of anxiety.   Relaxation: - Provides group, verbal and written instruction about the benefits of relaxation for patients with heart/lung disease. Also provides patients with examples of relaxation techniques.   Knowledge Questionnaire Score:     Knowledge Questionnaire Score - 01/15/16 1613      Knowledge Questionnaire Score   Pre Score 10/10       Core Components/Risk Factors/Patient Goals at Admission:     Personal Goals and Risk Factors at Admission - 01/15/16 1619      Core Components/Risk Factors/Patient Goals on Admission    Weight Management Yes;Obesity;Weight Loss   Intervention Weight Management: Develop a combined nutrition and exercise program designed to reach desired caloric intake, while maintaining appropriate intake of nutrient and fiber, sodium and fats, and appropriate energy expenditure required for the weight goal.;Weight Management: Provide education and appropriate resources to help participant work on and attain dietary goals.;Weight Management/Obesity: Establish reasonable short term and long term weight goals.;Obesity: Provide education and appropriate resources to help participant work on and attain dietary goals.   Admit Weight 358 lb (162.4 kg)   Goal Weight: Short Term 355 lb (161 kg)   Goal Weight: Long Term 300 lb (136.1 kg)   Expected Outcomes Long  Term: Adherence to nutrition and physical activity/exercise program aimed toward attainment of established weight goal;Weight Maintenance: Understanding of the daily nutrition guidelines, which includes 25-35% calories from fat, 7%  or less cal from saturated fats, less than 225m cholesterol, less than 1.5gm of sodium, & 5 or more servings of fruits and vegetables daily;Weight Loss: Understanding of general recommendations for a balanced deficit meal plan, which promotes 1-2 lb weight loss per week and includes a negative energy balance of [phone] kcal/d;Understanding recommendations for meals to include 15-35% energy as protein, 25-35% energy from fat, 35-60% energy from carbohydrates, less than 2095mof dietary cholesterol, 20-35 gm of total fiber daily;Understanding of distribution of calorie intake throughout the day with the consumption of 4-5 meals/snacks   Sedentary Yes   Intervention Provide advice, education, support and counseling about physical activity/exercise needs.;Develop an individualized exercise prescription for aerobic and resistive training based on initial evaluation findings, risk stratification, comorbidities and participant's personal goals.   Expected Outcomes Achievement of increased cardiorespiratory fitness and enhanced flexibility, muscular endurance and strength shown through measurements of functional capacity and personal statement of participant.   Increase Strength and Stamina Yes   Intervention Provide advice, education, support and counseling about physical activity/exercise needs.;Develop an individualized exercise prescription for aerobic and resistive training based on initial evaluation findings, risk stratification, comorbidities and participant's personal goals.   Expected Outcomes Achievement of increased cardiorespiratory fitness and enhanced flexibility, muscular endurance and strength shown through measurements of functional capacity and personal statement of participant.   Improve shortness of breath with ADL's Yes   Intervention Provide education, individualized exercise plan and daily activity instruction to help decrease symptoms of SOB with activities of daily living.   Expected  Outcomes Short Term: Achieves a reduction of symptoms when performing activities of daily living.   Develop more efficient breathing techniques such as purse lipped breathing and diaphragmatic breathing; and practicing self-pacing with activity Yes   Intervention Provide education, demonstration and support about specific breathing techniuqes utilized for more efficient breathing. Include techniques such as pursed lipped breathing, diaphragmatic breathing and self-pacing activity.   Expected Outcomes Short Term: Participant will be able to demonstrate and use breathing techniques as needed throughout daily activities.   Increase knowledge of respiratory medications and ability to use respiratory devices properly  Yes   Intervention Provide education and demonstration as needed of appropriate use of medications, inhalers, and oxygen therapy.   Expected Outcomes Short Term: Achieves understanding of medications use. Understands that oxygen is a medication prescribed by physician. Demonstrates appropriate use of inhaler and oxygen therapy.   Diabetes Yes   Intervention Provide education about signs/symptoms and action to take for hypo/hyperglycemia.;Provide education about proper nutrition, including hydration, and aerobic/resistive exercise prescription along with prescribed medications to achieve blood glucose in normal ranges: Fasting glucose 65-99 mg/dL   Expected Outcomes Short Term: Participant verbalizes understanding of the signs/symptoms and immediate care of hyper/hypoglycemia, proper foot care and importance of medication, aerobic/resistive exercise and nutrition plan for blood glucose control.;Long Term: Attainment of HbA1C < 7%.   Heart Failure Yes   Intervention Provide a combined exercise and nutrition program that is supplemented with education, support and counseling about heart failure. Directed toward relieving symptoms such as shortness of breath, decreased exercise tolerance, and  extremity edema.   Expected Outcomes Improve functional capacity of life;Short term: Attendance in program 2-3 days a week with increased exercise capacity. Reported lower sodium intake. Reported increased fruit and vegetable intake. Reports medication compliance.;Short term:  Daily weights obtained and reported for increase. Utilizing diuretic protocols set by physician.;Long term: Adoption of self-care skills and reduction of barriers for early signs and symptoms recognition and intervention leading to self-care maintenance.   Hypertension Yes   Intervention Provide education on lifestyle modifcations including regular physical activity/exercise, weight management, moderate sodium restriction and increased consumption of fresh fruit, vegetables, and low fat dairy, alcohol moderation, and smoking cessation.;Monitor prescription use compliance.   Expected Outcomes Short Term: Continued assessment and intervention until BP is < 140/23m HG in hypertensive participants. < 130/837mHG in hypertensive participants with diabetes, heart failure or chronic kidney disease.;Long Term: Maintenance of blood pressure at goal levels.   Lipids Yes   Intervention Provide education and support for participant on nutrition & aerobic/resistive exercise along with prescribed medications to achieve LDL <7056mHDL >23m84m Expected Outcomes Short Term: Participant states understanding of desired cholesterol values and is compliant with medications prescribed. Participant is following exercise prescription and nutrition guidelines.;Long Term: Cholesterol controlled with medications as prescribed, with individualized exercise RX and with personalized nutrition plan. Value goals: LDL < 70mg49mL > 40 mg.      Core Components/Risk Factors/Patient Goals Review:      Goals and Risk Factor Review    Row Name 02/07/16 1515             Core Components/Risk Factors/Patient Goals Review   Personal Goals Review  Sedentary;Increase Strength and Stamina;Develop more efficient breathing techniques such as purse lipped breathing and diaphragmatic breathing and practicing self-pacing with activity.       Review Ms ShielSpeasattended 3 sessions of pulmonary rehab.  She has obtained her exercise goals on the BioStep and T5 and performed interval training on the treadmill. Her PLB technique is good and her vital signs are acceptable. She is compliant with her diabetes, lipids, and blood pressure medications.       Expected Outcomes Continue exercising in [facility] to increase stamina and strength.          Core Components/Risk Factors/Patient Goals at Discharge (Final Review):      Goals and Risk Factor Review - 02/07/16 1515      Core Components/Risk Factors/Patient Goals Review   Personal Goals Review Sedentary;Increase Strength and Stamina;Develop more efficient breathing techniques such as purse lipped breathing and diaphragmatic breathing and practicing self-pacing with activity.   Review Ms ShielCadavidattended 3 sessions of pulmonary rehab.  She has obtained her exercise goals on the BioStep and T5 and performed interval training on the treadmill. Her PLB technique is good and her vital signs are acceptable. She is compliant with her diabetes, lipids, and blood pressure medications.   Expected Outcomes Continue exercising in [facility] to increase stamina and strength.      ITP Comments:     ITP Comments    Row Name 02/12/16 1541 03/04/16 1242 04/10/16 1612       ITP Comments Called Ms ShielBucholze last attended 01/29/16. Ms ShielZeolibeen short of breath and has had a stomach virus. She is making an appointment with Dr RamacMontel [place]. CarlaSaralyncalled to see if she was planning to return to [facility]. She stated that she does want to return but is not sure when she will be able to come back. She has been in the hospital with pneumonia since she last  attended and was given an antibiotic and is now home. She stated that she  has been getting out of bed, walking around the house with her walker, and sitting in her chair. Will follow up with her again in a few weeks to see if she know when she will return.  Called Ms Schlauch - last attended 01/28/17. She had pneumonia in January and has had fluid retention recently. [facility] has been following her, and she has lost 35 lbs with extra lasix. Ms Cairns has been moving around the house and getting up to her chair daily. Her plan is to return to [facility] the first of March.        Comments: 30 Day Review

## 2016-05-06 NOTE — Telephone Encounter (Signed)
Order placed. Nothing further needed. 

## 2016-05-16 ENCOUNTER — Ambulatory Visit: Payer: Medicaid Other

## 2016-05-21 ENCOUNTER — Telehealth: Payer: Self-pay | Admitting: Respiratory Therapy

## 2016-05-21 ENCOUNTER — Encounter: Payer: Self-pay | Admitting: Respiratory Therapy

## 2016-05-21 DIAGNOSIS — I5032 Chronic diastolic (congestive) heart failure: Secondary | ICD-10-CM

## 2016-05-21 DIAGNOSIS — J45909 Unspecified asthma, uncomplicated: Secondary | ICD-10-CM

## 2016-05-21 NOTE — Telephone Encounter (Signed)
Called Ms Worthey - she last attended 01/28/17. She has had several bouts with CHF. My last call, Ms Weitz was planning to return to [facility] the first of March. I have called her with no response, but now plan to discharge her from [facility] due to her absence for 4 months.

## 2016-05-28 ENCOUNTER — Encounter: Payer: Self-pay | Admitting: Respiratory Therapy

## 2016-05-28 DIAGNOSIS — J45909 Unspecified asthma, uncomplicated: Secondary | ICD-10-CM

## 2016-05-28 DIAGNOSIS — I5032 Chronic diastolic (congestive) heart failure: Secondary | ICD-10-CM

## 2016-05-28 NOTE — Progress Notes (Signed)
Pulmonary Individual Treatment Plan  Patient Details  Name: [name] MRN: [id number] Date of Birth: [birth year] Referring Provider:     Pulmonary Rehab from 01/15/2016 in [facility] Cardiac and Pulmonary Rehab  Referring Provider  Ramashandran MD      Initial Encounter Date:    Pulmonary Rehab from 01/15/2016 in [facility] Cardiac and Pulmonary Rehab  Date  01/15/16  Referring Provider  Ramashandran MD      Visit Diagnosis: Asthma, unspecified asthma severity, unspecified whether complicated, unspecified whether persistent  Chronic diastolic congestive heart failure ([place])  Morbid obesity, unspecified obesity type ([place])  [name] Home Medications on Admission:  Current Outpatient Prescriptions:    acetaminophen (TYLENOL) 500 MG tablet, Take 1,000 mg by mouth every 6 (six) hours as needed for mild pain, fever or headache., Disp: , Rfl:    albuterol (PROVENTIL HFA;VENTOLIN HFA) 108 (90 BASE) MCG/ACT inhaler, Inhale 2 puffs into the lungs every 6 (six) hours as needed for wheezing or shortness of breath., Disp: 1 Inhaler, Rfl: 0   amLODipine (NORVASC) 10 MG tablet, Take 1 tablet (10 mg total) by mouth daily., Disp: 30 tablet, Rfl: 0   aspirin EC 81 MG tablet, Take 81 mg by mouth at bedtime., Disp: , Rfl:    budesonide (PULMICORT) 0.25 MG/2ML nebulizer solution, Take 2 mLs (0.25 mg total) by nebulization 2 (two) times daily., Disp: 60 mL, Rfl: 12   citalopram (CELEXA) 40 MG tablet, Take 40 mg by mouth daily., Disp: , Rfl:    guaiFENesin-dextromethorphan (ROBITUSSIN DM) 100-10 MG/5ML syrup, Take 5 mLs by mouth every 4 (four) hours as needed for cough., Disp: 118 mL, Rfl: 0   hydrALAZINE (APRESOLINE) 100 MG tablet, Take 1 tablet (100 mg total) by mouth every 8 (eight) hours., Disp: 60 tablet, Rfl: 0   insulin aspart (NOVOLOG FLEXPEN) 100 UNIT/ML FlexPen, Inject 0-12 Units into the skin 3 (three) times daily with meals as needed for high blood sugar. Pt uses as needed per sliding  scale., Disp: , Rfl:    Insulin Degludec (TRESIBA FLEXTOUCH) 100 UNIT/ML SOPN, Inject 45 Units into the skin at bedtime., Disp: 1 pen, Rfl: 2   ipratropium-albuterol (DUONEB) 0.5-2.5 (3) MG/3ML SOLN, Take 3 mLs by nebulization every 6 (six) hours as needed (for wheezing/shortness of breath)., Disp: , Rfl:    losartan (COZAAR) 50 MG tablet, Take 1 tablet (50 mg total) by mouth daily., Disp: 30 tablet, Rfl: 5   metFORMIN (GLUCOPHAGE) 500 MG tablet, Take 500 mg by mouth 2 (two) times daily with a meal., Disp: , Rfl:    metoprolol (LOPRESSOR) 50 MG tablet, Take 50 mg by mouth 2 (two) times daily., Disp: , Rfl:    potassium chloride (K-DUR,KLOR-CON) 10 MEQ tablet, Take 1 tablet (10 mEq total) by mouth 2 (two) times daily. Can take an additional tablet when takes the metolazone, Disp: 80 tablet, Rfl: 5   simvastatin (ZOCOR) 40 MG tablet, Take 40 mg by mouth at bedtime., Disp: , Rfl:    torsemide (DEMADEX) 20 MG tablet, Take 2 tablets (40 mg total) by mouth daily., Disp: 60 tablet, Rfl: 5  Past Medical History: Past Medical History:  Diagnosis Date   Asthma    CHF (congestive heart failure) (HCC)    COPD (chronic obstructive pulmonary disease) (HCC)    Diabetes mellitus without complication (HCC)    Hypertension    Iron deficiency anemia    Pericardial effusion    a. 11/2014 CT Chest: Mod effusion.   Poorly controlled diabetes mellitus ([place])  a. 11/2014 A1c 13.2.    Tobacco Use: History  Smoking Status   Never Smoker  Smokeless Tobacco   Never Used    Labs: Recent Review Flowsheet Data    Labs for ITP Cardiac and Pulmonary Rehab Latest Ref Rng & Units 12/15/2014 01/03/2015 08/09/2015 02/23/2016   Cholestrol 0 - 200 mg/dL - 232(H) - -   LDLCALC 0 - 99 mg/dL - 169(H) - -   HDL >40 mg/dL - 41 - -   Trlycerides <150 mg/dL - 112 - -   Hemoglobin A1c 4.0 - 6.0 % 13.2(H) - 10.7(H) -   HCO3 20.0 - 28.0 mmol/L - - 36.4(H) 38.0(H)   O2SAT % - - 80.3 84.3       ADL  UCSD:     Pulmonary Assessment Scores    Row Name 01/15/16 1615         ADL UCSD   ADL Phase Entry     SOB Score total 118     Rest 3     Walk 5     Stairs 5     Bath 5     Dress 5     Shop 5        Pulmonary Function Assessment:     Pulmonary Function Assessment - 01/15/16 1614      Pulmonary Function Tests   FVC% 27 %   FEV1% 26 %   FEV1/FVC Ratio 79.61     Initial Spirometry Results   Comments Test date 01/15/16     Breath   Bilateral Breath Sounds Clear;Decreased   Shortness of Breath Yes;Limiting activity;Panic with Shortness of Breath;Fear of Shortness of Breath      Exercise Target Goals:    Exercise Program Goal: Individual exercise prescription set with THRR, safety & activity barriers. Participant demonstrates ability to understand and report RPE using BORG scale, to self-measure pulse accurately, and to acknowledge the importance of the exercise prescription.  Exercise Prescription Goal: Starting with aerobic activity 30 plus minutes a day, 3 days per week for initial exercise prescription. Provide home exercise prescription and guidelines that participant acknowledges understanding prior to discharge.  Activity Barriers & Risk Stratification:     Activity Barriers & Cardiac Risk Stratification - 01/15/16 1613      Activity Barriers & Cardiac Risk Stratification   Activity Barriers Joint Problems;Deconditioning;Muscular Weakness;Shortness of Breath;Assistive Device;Balance Concerns  also has a fear of falling, uses rollator at home and was encouraged to bring it to rehab.   Cardiac Risk Stratification Moderate      6 Minute Walk:     6 Minute Walk    Row Name 01/15/16 1637         6 Minute Walk   Phase Initial     Distance 140 feet     Walk Time 2.33 minutes     # of Rest Breaks 2  1:26, stopped at 2:56      MPH 0.68     METS 1.5     RPE 12     Perceived Dyspnea  5     VO2 Peak 1.99     Symptoms Yes (comment)     Comments  hurting pain in legs and back (8/10), anxiety about falling     Resting HR 83 bpm     Resting BP 166/60     Max Ex. HR 90 bpm     Max Ex. BP 152/54     2 Minute Post BP 134/60  Interval HR   Baseline HR 83     1 Minute HR 85     2 Minute HR 84     3 Minute HR 90     4 Minute HR 86     5 Minute HR 84     6 Minute HR 84     2 Minute Post HR 82     Interval Heart Rate? Yes       Interval Oxygen   Interval Oxygen? Yes     Baseline Oxygen Saturation % 96 %  was 88% on her pulsed 3L     Baseline Liters of Oxygen 4 L  switched to e-cylinder continuous flow     1 Minute Oxygen Saturation % 95 %  rest break 30-1:56     1 Minute Liters of Oxygen 4 L     2 Minute Oxygen Saturation % 94 %     2 Minute Liters of Oxygen 4 L     3 Minute Oxygen Saturation % 94 %  pt sat down at 2:56     3 Minute Liters of Oxygen 4 L     4 Minute Oxygen Saturation % 96 %     4 Minute Liters of Oxygen 4 L     5 Minute Oxygen Saturation % 98 %     5 Minute Liters of Oxygen 4 L     6 Minute Oxygen Saturation % 100 %     6 Minute Liters of Oxygen 4 L     2 Minute Post Oxygen Saturation % 100 %     2 Minute Post Liters of Oxygen 4 L       Oxygen Initial Assessment:   Oxygen Re-Evaluation:   Oxygen Discharge (Final Oxygen Re-Evaluation):   Initial Exercise Prescription:     Initial Exercise Prescription - 01/15/16 1600      Date of Initial Exercise RX and Referring Provider   Date 01/15/16   Referring Provider Ramashandran MD     Oxygen   Oxygen Continuous   Liters 4     Treadmill   MPH 0.5   Grade 0   Minutes 3  1 min intervals   METs 1.5     T5 Nustep   Level 1   SPM --  60-80 spm   Minutes 15   METs 1.5     Biostep-RELP   Level 1   SPM --  40-50 spm   Minutes 15   METs 1     Prescription Details   Frequency (times per week) 3   Duration Progress to 45 minutes of aerobic exercise without signs/symptoms of physical distress     Intensity   THRR 40-80% of Max  Heartrate 120-157   Ratings of Perceived Exertion 11-15   Perceived Dyspnea 0-4     Progression   Progression Continue to progress workloads to maintain intensity without signs/symptoms of physical distress.     Resistance Training   Training Prescription Yes   Weight 2 lbs   Reps 10-12      Perform Capillary Blood Glucose checks as needed.  Exercise Prescription Changes:     Exercise Prescription Changes    Row Name 01/22/16 1300 01/30/16 1400           Response to Exercise   Blood Pressure (Admit) 138/82 184/70      Blood Pressure (Exercise) 168/88 166/82      Blood Pressure (Exit) 138/80 148/68  Heart Rate (Admit) 89 bpm 81 bpm      Heart Rate (Exercise) 95 bpm 83 bpm      Heart Rate (Exit) 86 bpm 82 bpm      Oxygen Saturation (Admit) 96 % 90 %      Oxygen Saturation (Exercise) 93 % 95 %      Oxygen Saturation (Exit) 100 % 99 %      Rating of Perceived Exertion (Exercise) 15 11      Perceived Dyspnea (Exercise) 3 3      Symptoms  -- none      Duration Progress to 45 minutes of aerobic exercise without signs/symptoms of physical distress Progress to 45 minutes of aerobic exercise without signs/symptoms of physical distress      Intensity THRR unchanged THRR unchanged        Progression   Progression Continue to progress workloads to maintain intensity without signs/symptoms of physical distress. Continue to progress workloads to maintain intensity without signs/symptoms of physical distress.      Average METs  -- 1.45        Resistance Training   Training Prescription Yes Yes      Weight 2 2      Reps 10-12 10-12        Interval Training   Interval Training No No        Oxygen   Oxygen Continuous Continuous      Liters 4 4        Treadmill   MPH 0.5 0.5      Grade 0 0      Minutes 3 3      METs  -- 1.4        T5 Nustep   Level  -- 1      Minutes  -- 15      METs  -- 1.5        Biostep-RELP   Level 1 1      Minutes 17 15      METs  -- 1         Exercise Review   Progression No Yes         Exercise Comments:     Exercise Comments    Row Name 01/15/16 1642 01/30/16 1421 02/14/16 1520       Exercise Comments [name] wants to get her independence back and to be able to get out more.  She has been spending a lot of time in her bed.  I talked with her and her husband about getting up and moving out to the living room to sit in the chair with her feet propped up to help start to increase her strength and stamina. [name]'[name] first day of exercise was 11/27.  She did great.  She has [name] well with her first three visits.  She has gotten out of the bed and into the chair in the living room.  We will continue to monitor her progression. [name] has been out since last review.        Exercise Goals and Review:   Exercise Goals Re-Evaluation :   Discharge Exercise Prescription (Final Exercise Prescription Changes):     Exercise Prescription Changes - 01/30/16 1400      Response to Exercise   Blood Pressure (Admit) 184/70   Blood Pressure (Exercise) 166/82   Blood Pressure (Exit) 148/68   Heart Rate (Admit) 81 bpm   Heart Rate (Exercise) 83 bpm   Heart Rate (Exit) 82 bpm  Oxygen Saturation (Admit) 90 %   Oxygen Saturation (Exercise) 95 %   Oxygen Saturation (Exit) 99 %   Rating of Perceived Exertion (Exercise) 11   Perceived Dyspnea (Exercise) 3   Symptoms none   Duration Progress to 45 minutes of aerobic exercise without signs/symptoms of physical distress   Intensity THRR unchanged     Progression   Progression Continue to progress workloads to maintain intensity without signs/symptoms of physical distress.   Average METs 1.45     Resistance Training   Training Prescription Yes   Weight 2   Reps 10-12     Interval Training   Interval Training No     Oxygen   Oxygen Continuous   Liters 4     Treadmill   MPH 0.5   Grade 0   Minutes 3   METs 1.4     T5 Nustep   Level 1   Minutes 15   METs 1.5      Biostep-RELP   Level 1   Minutes 15   METs 1     Exercise Review   Progression Yes      Nutrition:  Target Goals: Understanding of nutrition guidelines, daily intake of sodium <1552m, cholesterol <2036m calories 30% from fat and 7% or less from saturated fats, daily to have 5 or more servings of fruits and vegetables.  Biometrics:    Nutrition Therapy Plan and Nutrition Goals:   Nutrition Discharge: Rate Your Plate Scores:   Nutrition Goals Re-Evaluation:   Nutrition Goals Discharge (Final Nutrition Goals Re-Evaluation):   Psychosocial: Target Goals: Acknowledge presence or absence of significant depression and/or stress, maximize coping skills, provide positive support system. Participant is able to verbalize types and ability to use techniques and skills needed for reducing stress and depression.   Initial Review & Psychosocial Screening:     Initial Psych Review & Screening - 01/15/16 1623      Family Dynamics   Good Support System? Yes   Comments [name] good support from her family and knows that she has to make changes to her life for a healthier life for her family and herself.     Barriers   Psychosocial barriers to participate in program The patient should benefit from training in stress management and relaxation.     Screening Interventions   Interventions Encouraged to exercise;Program counselor consult      Quality of Life Scores:     Quality of Life - 01/15/16 1625      Quality of Life Scores   Health/Function Pre 20.75 %   Socioeconomic Pre 20.43 %   Psych/Spiritual Pre 21 %   Family Pre 20.6 %   GLOBAL Pre 20.71 %      PHQ-9: Recent Review Flowsheet Data    [facility] 04/26/2016 04/12/2016 03/29/2016 03/25/2016 01/15/2016   Decreased Interest 3 1 1  0 2   Down, Depressed, Hopeless 1 0 1 2 1    PHQ - 2 Score 4 1 2 2 3    Altered sleeping 3 1 3 1 3    Tired, decreased energy 3 1 3 2 3    Change in appetite 1 1 1 1 2     Feeling bad or failure about yourself  1 0 1 3 2    Trouble concentrating 3 0 1 3 2    Moving slowly or fidgety/restless 0 - 1 1 1    Suicidal thoughts 0 0 0 0 1   PHQ-9 Score 15 4 12 13  17  Difficult doing work/chores Somewhat difficult Not difficult at all Very difficult - Very difficult     Interpretation of Total Score  Total Score Depression Severity:  1-4 = Minimal depression, 5-9 = Mild depression, 10-14 = Moderate depression, 15-19 = Moderately severe depression, 20-27 = Severe depression   Psychosocial Evaluation and Intervention:     Psychosocial Evaluation - 01/24/16 1223      Psychosocial Evaluation & Interventions   Interventions Stress management education;Relaxation education;Encouraged to exercise with the program and follow exercise prescription   Comments Counselor met with [name] ([name]) today for initial psychosocial evaluation.  She is a 46 year old who has multiple health issues including COPD; Type 2 Diabetes; CHF and Asthma.  She has a strong support system with a spouse of 15 years and (4) adult children - (2) of which live either in the home or close by.  [name] reports sleeping better since she went on Oxygen 6 months ago.  Her appetite is up and down - but mostly good.    She reports a history of anxiety and has been on a variety of medications for this and is currently on Celexa 40 mg.  Counselor discussed with [name] her PHQ-9 score of "17" and of this indicating moderately severe depression.  She denies SI/HI but does admit to a lot of stress in her life currently - particularly since she is unable to work and her spouse is on SSI so finances are extremely limited.  She is concerned about having the means to put gas in her car for these classes 3x/week.  [name] has goals to "get back up and moving around" since she admits to being bed-ridden for the past year or so.  counselor encouraged her to come consistently - if possible for the best results.  And to speak with  the nurses about transportation options that might be available.  Counselor also mentioned if mood does not improve with consistent exercise in the next few weeks, [name] may need to speak with her Dr. about increasing her medications for depression.  Counselor and staff will continue to follow with her throughout the course of this program.     Continue Psychosocial Services  Yes  [name] may need her Rx for depression increased if exercise does not help over the next few weeks.  She agreed.  Also, she may have barriers to attend with limited $$ for gasoline.        Psychosocial Re-Evaluation:   Psychosocial Discharge (Final Psychosocial Re-Evaluation):   Education: Education Goals: Education classes will be provided on a weekly basis, covering required topics. Participant will state understanding/return demonstration of topics presented.  Learning Barriers/Preferences:     Learning Barriers/Preferences - 01/15/16 1613      Learning Barriers/Preferences   Learning Barriers None   Learning Preferences None      Education Topics: Initial Evaluation Education: - Verbal, written and demonstration of respiratory meds, RPE/PD scales, oximetry and breathing techniques. Instruction on use of nebulizers and MDIs: cleaning and proper use, rinsing mouth with steroid doses and importance of monitoring MDI activations.   Pulmonary Rehab from 01/29/2016 in [facility] Cardiac and Pulmonary Rehab  Date  01/15/16  Educator  LB  Instruction Review Code  2- meets goals/outcomes      General Nutrition Guidelines/Fats and Fiber: -Group instruction provided by verbal, written material, models and posters to present the general guidelines for heart healthy nutrition. Gives an explanation and review of  dietary fats and fiber.   Controlling Sodium/Reading Food Labels: -Group verbal and written material supporting the discussion of sodium use in heart healthy nutrition. Review and explanation with models,  verbal and written materials for utilization of the food label.   Exercise Physiology & Risk Factors: - Group verbal and written instruction with models to review the exercise physiology of the cardiovascular system and associated critical values. Details cardiovascular disease risk factors and the goals associated with each risk factor.   Aerobic Exercise & Resistance Training: - Gives group verbal and written discussion on the health impact of inactivity. On the components of aerobic and resistive training programs and the benefits of this training and how to safely progress through these programs.   Flexibility, Balance, General Exercise Guidelines: - Provides group verbal and written instruction on the benefits of flexibility and balance training programs. Provides general exercise guidelines with specific guidelines to those with heart or lung disease. Demonstration and skill practice provided.   Stress Management: - Provides group verbal and written instruction about the health risks of elevated stress, cause of high stress, and healthy ways to reduce stress.   Depression: - Provides group verbal and written instruction on the correlation between heart/lung disease and depressed mood, treatment options, and the stigmas associated with seeking treatment.   Pulmonary Rehab from 01/29/2016 in [facility] Cardiac and Pulmonary Rehab  Date  01/29/16  Educator  [facility]  Instruction Review Code  2- meets goals/outcomes      Exercise & Equipment Safety: - Individual verbal instruction and demonstration of equipment use and safety with use of the equipment.   Infection Prevention: - Provides verbal and written material to individual with discussion of infection control including proper hand washing and proper equipment cleaning during exercise session.   Pulmonary Rehab from 01/29/2016 in [facility] Cardiac and Pulmonary Rehab  Date  01/15/16  Educator  LB  Instruction Review Code  2- meets  goals/outcomes      Falls Prevention: - Provides verbal and written material to individual with discussion of falls prevention and safety.   Pulmonary Rehab from 01/29/2016 in [facility] Cardiac and Pulmonary Rehab  Date  01/15/16  Educator  LB  Instruction Review Code  2- meets goals/outcomes      Diabetes: - Individual verbal and written instruction to review signs/symptoms of diabetes, desired ranges of glucose level fasting, after meals and with exercise. Advice that pre and post exercise glucose checks will be [name] for 3 sessions at entry of program.   Chronic Lung Diseases: - Group verbal and written instruction to review new updates, new respiratory medications, new advancements in procedures and treatments. Provide informative websites and "800" numbers of self-education.   Pulmonary Rehab from 01/29/2016 in [facility] Cardiac and Pulmonary Rehab  Date  01/24/16  Educator  LB  Instruction Review Code  2- meets goals/outcomes      Lung Procedures: - Group verbal and written instruction to describe testing methods [name] to diagnose lung disease. Review the outcome of test results. Describe the treatment choices: Pulmonary Function Tests, ABGs and oximetry.   Energy Conservation: - Provide group verbal and written instruction for methods to conserve energy, plan and organize activities. Instruct on pacing techniques, use of adaptive equipment and posture/positioning to relieve shortness of breath.   Triggers: - Group verbal and written instruction to review types of environmental controls: home humidity, furnaces, filters, dust mite/pet prevention, HEPA vacuums. To discuss weather changes, air quality and the benefits of nasal washing.  Exacerbations: - Group verbal and written instruction to provide: warning signs, infection symptoms, calling MD promptly, preventive modes, and value of vaccinations. Review: effective airway clearance, coughing and/or vibration techniques. Create an  Sports administrator.   Oxygen: - Individual and group verbal and written instruction on oxygen therapy. Includes supplement oxygen, available portable oxygen systems, continuous and intermittent flow rates, oxygen safety, concentrators, and Medicare reimbursement for oxygen.   Pulmonary Rehab from 01/29/2016 in [facility] Cardiac and Pulmonary Rehab  Date  01/15/16  Educator  LB  Instruction Review Code  2- meets goals/outcomes      Respiratory Medications: - Group verbal and written instruction to review medications for lung disease. Drug class, frequency, complications, importance of spacers, rinsing mouth after steroid MDI'[name], and proper cleaning methods for nebulizers.   Pulmonary Rehab from 01/29/2016 in [facility] Cardiac and Pulmonary Rehab  Date  01/15/16  Educator  LB  Instruction Review Code  2- meets goals/outcomes      AED/CPR: - Group verbal and written instruction with the use of models to demonstrate the basic use of the AED with the basic ABC'[name] of resuscitation.   Breathing Retraining: - Provides individuals verbal and written instruction on purpose, frequency, and proper technique of diaphragmatic breathing and pursed-lipped breathing. Applies individual practice skills.   Pulmonary Rehab from 01/29/2016 in [facility] Cardiac and Pulmonary Rehab  Date  01/15/16  Educator  LB  Instruction Review Code  2- meets goals/outcomes      Anatomy and Physiology of the Lungs: - Group verbal and written instruction with the use of models to provide basic lung anatomy and physiology related to function, structure and complications of lung disease.   Heart Failure: - Group verbal and written instruction on the basics of heart failure: signs/symptoms, treatments, explanation of ejection fraction, enlarged heart and cardiomyopathy.   Sleep Apnea: - Individual verbal and written instruction to review Obstructive Sleep Apnea. Review of risk factors, methods for diagnosing and types of masks and machines  for OSA.   Anxiety: - Provides group, verbal and written instruction on the correlation between heart/lung disease and anxiety, treatment options, and management of anxiety.   Relaxation: - Provides group, verbal and written instruction about the benefits of relaxation for patients with heart/lung disease. Also provides patients with examples of relaxation techniques.   Knowledge Questionnaire Score:     Knowledge Questionnaire Score - 01/15/16 1613      Knowledge Questionnaire Score   Pre Score 10/10       Core Components/Risk Factors/Patient Goals at Admission:     Personal Goals and Risk Factors at Admission - 01/15/16 1619      Core Components/Risk Factors/Patient Goals on Admission    Weight Management Yes;Obesity;Weight Loss   Intervention Weight Management: Develop a combined nutrition and exercise program designed to reach desired caloric intake, while maintaining appropriate intake of nutrient and fiber, sodium and fats, and appropriate energy expenditure required for the weight goal.;Weight Management: Provide education and appropriate resources to help participant work on and attain dietary goals.;Weight Management/Obesity: Establish reasonable short term and long term weight goals.;Obesity: Provide education and appropriate resources to help participant work on and attain dietary goals.   Admit Weight 358 lb (162.4 kg)   Goal Weight: Short Term 355 lb (161 kg)   Goal Weight: Long Term 300 lb (136.1 kg)   Expected Outcomes Long Term: Adherence to nutrition and physical activity/exercise program aimed toward attainment of established weight goal;Weight Maintenance: Understanding of the daily nutrition guidelines, which  includes 25-35% calories from fat, 7% or less cal from saturated fats, less than 261m cholesterol, less than 1.5gm of sodium, & 5 or more servings of fruits and vegetables daily;Weight Loss: Understanding of general recommendations for a balanced deficit meal  plan, which promotes 1-2 lb weight loss per week and includes a negative energy balance of [phone] kcal/d;Understanding recommendations for meals to include 15-35% energy as protein, 25-35% energy from fat, 35-60% energy from carbohydrates, less than 2059mof dietary cholesterol, 20-35 gm of total fiber daily;Understanding of distribution of calorie intake throughout the day with the consumption of 4-5 meals/snacks   Sedentary Yes   Intervention Provide advice, education, support and counseling about physical activity/exercise needs.;Develop an individualized exercise prescription for aerobic and resistive training based on initial evaluation findings, risk stratification, comorbidities and participant'[name] personal goals.   Expected Outcomes Achievement of increased cardiorespiratory fitness and enhanced flexibility, muscular endurance and strength shown through measurements of functional capacity and personal statement of participant.   Increase Strength and Stamina Yes   Intervention Provide advice, education, support and counseling about physical activity/exercise needs.;Develop an individualized exercise prescription for aerobic and resistive training based on initial evaluation findings, risk stratification, comorbidities and participant'[name] personal goals.   Expected Outcomes Achievement of increased cardiorespiratory fitness and enhanced flexibility, muscular endurance and strength shown through measurements of functional capacity and personal statement of participant.   Improve shortness of breath with ADL'[name] Yes   Intervention Provide education, individualized exercise plan and daily activity instruction to help decrease symptoms of SOB with activities of daily living.   Expected Outcomes Short Term: Achieves a reduction of symptoms when performing activities of daily living.   Develop more efficient breathing techniques such as purse lipped breathing and diaphragmatic breathing; and practicing  self-pacing with activity Yes   Intervention Provide education, demonstration and support about specific breathing techniuqes utilized for more efficient breathing. Include techniques such as pursed lipped breathing, diaphragmatic breathing and self-pacing activity.   Expected Outcomes Short Term: Participant will be able to demonstrate and use breathing techniques as needed throughout daily activities.   Increase knowledge of respiratory medications and ability to use respiratory devices properly  Yes   Intervention Provide education and demonstration as needed of appropriate use of medications, inhalers, and oxygen therapy.   Expected Outcomes Short Term: Achieves understanding of medications use. Understands that oxygen is a medication prescribed by physician. Demonstrates appropriate use of inhaler and oxygen therapy.   Diabetes Yes   Intervention Provide education about signs/symptoms and action to take for hypo/hyperglycemia.;Provide education about proper nutrition, including hydration, and aerobic/resistive exercise prescription along with prescribed medications to achieve blood glucose in normal ranges: Fasting glucose 65-99 mg/dL   Expected Outcomes Short Term: Participant verbalizes understanding of the signs/symptoms and immediate care of hyper/hypoglycemia, proper foot care and importance of medication, aerobic/resistive exercise and nutrition plan for blood glucose control.;Long Term: Attainment of HbA1C < 7%.   Heart Failure Yes   Intervention Provide a combined exercise and nutrition program that is supplemented with education, support and counseling about heart failure. Directed toward relieving symptoms such as shortness of breath, decreased exercise tolerance, and extremity edema.   Expected Outcomes Improve functional capacity of life;Short term: Attendance in program 2-3 days a week with increased exercise capacity. Reported lower sodium intake. Reported increased fruit and vegetable  intake. Reports medication compliance.;Short term: Daily weights obtained and reported for increase. Utilizing diuretic protocols set by physician.;Long term: Adoption of self-care skills and reduction of barriers for  early signs and symptoms recognition and intervention leading to self-care maintenance.   Hypertension Yes   Intervention Provide education on lifestyle modifcations including regular physical activity/exercise, weight management, moderate sodium restriction and increased consumption of fresh fruit, vegetables, and low fat dairy, alcohol moderation, and smoking cessation.;Monitor prescription use compliance.   Expected Outcomes Short Term: Continued assessment and intervention until BP is < 140/37m HG in hypertensive participants. < 130/824mHG in hypertensive participants with diabetes, heart failure or chronic kidney disease.;Long Term: Maintenance of blood pressure at goal levels.   Lipids Yes   Intervention Provide education and support for participant on nutrition & aerobic/resistive exercise along with prescribed medications to achieve LDL <7013mHDL >81m43m Expected Outcomes Short Term: Participant states understanding of desired cholesterol values and is compliant with medications prescribed. Participant is following exercise prescription and nutrition guidelines.;Long Term: Cholesterol controlled with medications as prescribed, with individualized exercise RX and with personalized nutrition plan. Value goals: LDL < 70mg63mL > 40 mg.      Core Components/Risk Factors/Patient Goals Review:      Goals and Risk Factor Review    Row Name 02/07/16 1515             Core Components/Risk Factors/Patient Goals Review   Personal Goals Review Sedentary;Increase Strength and Stamina;Develop more efficient breathing techniques such as purse lipped breathing and diaphragmatic breathing and practicing self-pacing with activity.       Review [name] 3 sessions of  pulmonary rehab.  She has obtained her exercise goals on the BioStep and T5 and performed interval training on the treadmill. Her PLB technique is good and her vital signs are acceptable. She is compliant with her diabetes, lipids, and blood pressure medications.       Expected Outcomes Continue exercising in [facility] to increase stamina and strength.          Core Components/Risk Factors/Patient Goals at Discharge (Final Review):      Goals and Risk Factor Review - 02/07/16 1515      Core Components/Risk Factors/Patient Goals Review   Personal Goals Review Sedentary;Increase Strength and Stamina;Develop more efficient breathing techniques such as purse lipped breathing and diaphragmatic breathing and practicing self-pacing with activity.   Review [name] 3 sessions of pulmonary rehab.  She has obtained her exercise goals on the BioStep and T5 and performed interval training on the treadmill. Her PLB technique is good and her vital signs are acceptable. She is compliant with her diabetes, lipids, and blood pressure medications.   Expected Outcomes Continue exercising in [facility] to increase stamina and strength.      ITP Comments:     ITP Comments    Row Name 02/12/16 1541 03/04/16 1242 04/10/16 1612 05/21/16 1044     ITP Comments Called [name] last attended 01/29/16. [name] short of breath and has had a stomach virus. She is making an appointment with Dr RamacMontel Culverhopes to return to [facility]. [name] to see if she was planning to return to Pulmonary Rehab. She stated that she does want to return but is not sure when she will be able to come back. She has been in the hospital with pneumonia since she last attended and was given an antibiotic and is now home. She stated that she has been getting out of bed, walking around the house with her walker, and sitting in her chair. Will follow up with her again  in a few weeks to see if she  know when she will return.  Called [name] - last attended 01/28/17. She had pneumonia in January and has had fluid retention recently. [facility] has been following her, and she has lost 35 lbs with extra lasix. [name] has been moving around the house and getting up to her chair daily. Her plan is to return to [place] the first of March. Called [name] - she last attended 01/28/17. She has had several bouts with CHF. My last call, [name] was planning to return to [place] the first of March. I have called her with no response, but now plan to discharge her from [facility] due to her absence for 4 months.       Comments: Early Exit:  [name] last attended 01/28/17. She has had episodes of CHF  and has not been able to return to [facility], so after  a 4 month absenance , [name] is discharged from [facility].

## 2016-05-28 NOTE — Progress Notes (Signed)
Discharge Summary  Patient Details  Name: [name] MRN: [id number] Date of Birth: 1970-12-26 Referring Provider:     Pulmonary Rehab from 01/15/2016 in [facility]  Referring Provider  Ramashandran MD       Number of Visits: 4 Reason for Discharge:  Early Exit:  [name] last attended 01/28/17. She has had episodes of CHF  and has not been able to return to [facility], so after  a 4 month absenance , [name] is discharged from [facility].  Smoking History:  History  Smoking Status   Never Smoker  Smokeless Tobacco   Never Used    Diagnosis:  Asthma, unspecified asthma severity, unspecified whether complicated, unspecified whether persistent  Chronic diastolic congestive heart failure ([place])  Morbid obesity, unspecified obesity type ([place])  ADL UCSD:     Pulmonary Assessment Scores    Row Name 01/15/16 1615         ADL UCSD   ADL Phase Entry     SOB Score total 118     Rest 3     Walk 5     Stairs 5     Bath 5     Dress 5     Shop 5        Initial Exercise Prescription:     Initial Exercise Prescription - 01/15/16 1600      Date of Initial Exercise RX and Referring Provider   Date 01/15/16   Referring Provider Ramashandran MD     Oxygen   Oxygen Continuous   Liters 4     Treadmill   MPH 0.5   Grade 0   Minutes 3  1 min intervals   METs 1.5     T5 Nustep   Level 1   SPM --  60-80 spm   Minutes 15   METs 1.5     Biostep-RELP   Level 1   SPM --  40-50 spm   Minutes 15   METs 1     Prescription Details   Frequency (times per week) 3   Duration Progress to 45 minutes of aerobic exercise without signs/symptoms of physical distress     Intensity   THRR 40-80% of Max Heartrate 120-157   Ratings of Perceived Exertion 11-15   Perceived Dyspnea 0-4     Progression   Progression Continue to progress workloads to maintain intensity without signs/symptoms of physical distress.     Resistance Training    Training Prescription Yes   Weight 2 lbs   Reps 10-12      Discharge Exercise Prescription (Final Exercise Prescription Changes):     Exercise Prescription Changes - 01/30/16 1400      Response to Exercise   Blood Pressure (Admit) 184/70   Blood Pressure (Exercise) 166/82   Blood Pressure (Exit) 148/68   Heart Rate (Admit) 81 bpm   Heart Rate (Exercise) 83 bpm   Heart Rate (Exit) 82 bpm   Oxygen Saturation (Admit) 90 %   Oxygen Saturation (Exercise) 95 %   Oxygen Saturation (Exit) 99 %   Rating of Perceived Exertion (Exercise) 11   Perceived Dyspnea (Exercise) 3   Symptoms none   Duration Progress to 45 minutes of aerobic exercise without signs/symptoms of physical distress   Intensity THRR unchanged     Progression   Progression Continue to progress workloads to maintain intensity without signs/symptoms of physical distress.   Average METs 1.45     Horticulturist, commercial  Prescription Yes   Weight 2   Reps 10-12     Interval Training   Interval Training No     Oxygen   Oxygen Continuous   Liters 4     Treadmill   MPH 0.5   Grade 0   Minutes 3   METs 1.4     T5 Nustep   Level 1   Minutes 15   METs 1.5     Biostep-RELP   Level 1   Minutes 15   METs 1     Exercise Review   Progression Yes      Functional Capacity:     6 Minute Walk    Row Name 01/15/16 1637         6 Minute Walk   Phase Initial     Distance 140 feet     Walk Time 2.33 minutes     # of Rest Breaks 2  1:26, stopped at 2:56      MPH 0.68     METS 1.5     RPE 12     Perceived Dyspnea  5     VO2 Peak 1.99     Symptoms Yes (comment)     Comments hurting pain in legs and back (8/10), anxiety about falling     Resting HR 83 bpm     Resting BP 166/60     Max Ex. HR 90 bpm     Max Ex. BP 152/54     2 Minute Post BP 134/60       Interval HR   Baseline HR 83     1 Minute HR 85     2 Minute HR 84     3 Minute HR 90     4 Minute HR 86     5 Minute HR 84     6  Minute HR 84     2 Minute Post HR 82     Interval Heart Rate? Yes       Interval Oxygen   Interval Oxygen? Yes     Baseline Oxygen Saturation % 96 %  was 88% on her pulsed 3L     Baseline Liters of Oxygen 4 L  switched to e-cylinder continuous flow     1 Minute Oxygen Saturation % 95 %  rest break 30-1:56     1 Minute Liters of Oxygen 4 L     2 Minute Oxygen Saturation % 94 %     2 Minute Liters of Oxygen 4 L     3 Minute Oxygen Saturation % 94 %  pt sat down at 2:56     3 Minute Liters of Oxygen 4 L     4 Minute Oxygen Saturation % 96 %     4 Minute Liters of Oxygen 4 L     5 Minute Oxygen Saturation % 98 %     5 Minute Liters of Oxygen 4 L     6 Minute Oxygen Saturation % 100 %     6 Minute Liters of Oxygen 4 L     2 Minute Post Oxygen Saturation % 100 %     2 Minute Post Liters of Oxygen 4 L        Psychological, QOL, Others - Outcomes: PHQ 2/9: Depression screen [facility] 2/9 04/26/2016 04/12/2016 03/29/2016 03/25/2016 01/15/2016  Decreased Interest 3 1 1  0 2  Down, Depressed, Hopeless 1 0 1 2 1   PHQ - 2 Score 4 1  2 2 3   Altered sleeping 3 1 3 1 3   Tired, decreased energy 3 1 3 2 3   Change in appetite 1 1 1 1 2   Feeling bad or failure about yourself  1 0 1 3 2   Trouble concentrating 3 0 1 3 2   Moving slowly or fidgety/restless 0 - 1 1 1   Suicidal thoughts 0 0 0 0 1  PHQ-9 Score 15 4 12 13 17   Difficult doing work/chores Somewhat difficult Not difficult at all Very difficult - Very difficult    Quality of Life:     Quality of Life - 01/15/16 1625      Quality of Life Scores   Health/Function Pre 20.75 %   Socioeconomic Pre 20.43 %   Psych/Spiritual Pre 21 %   Family Pre 20.6 %   GLOBAL Pre 20.71 %      Personal Goals: Goals established at orientation with interventions provided to work toward goal.     Personal Goals and Risk Factors at Admission - 01/15/16 1619      Core Components/Risk Factors/Patient Goals on Admission    Weight Management  Yes;Obesity;Weight Loss   Intervention Weight Management: Develop a combined nutrition and exercise program designed to reach desired caloric intake, while maintaining appropriate intake of nutrient and fiber, sodium and fats, and appropriate energy expenditure required for the weight goal.;Weight Management: Provide education and appropriate resources to help participant work on and attain dietary goals.;Weight Management/Obesity: Establish reasonable short term and long term weight goals.;Obesity: Provide education and appropriate resources to help participant work on and attain dietary goals.   Admit Weight 358 lb (162.4 kg)   Goal Weight: Short Term 355 lb (161 kg)   Goal Weight: Long Term 300 lb (136.1 kg)   Expected Outcomes Long Term: Adherence to nutrition and physical activity/exercise program aimed toward attainment of established weight goal;Weight Maintenance: Understanding of the daily nutrition guidelines, which includes 25-35% calories from fat, 7% or less cal from saturated fats, less than 200mg  cholesterol, less than 1.5gm of sodium, & 5 or more servings of fruits and vegetables daily;Weight Loss: Understanding of general recommendations for a balanced deficit meal plan, which promotes 1-2 lb weight loss per week and includes a negative energy balance of [phone] kcal/d;Understanding recommendations for meals to include 15-35% energy as protein, 25-35% energy from fat, 35-60% energy from carbohydrates, less than 200mg  of dietary cholesterol, 20-35 gm of total fiber daily;Understanding of distribution of calorie intake throughout the day with the consumption of 4-5 meals/snacks   Sedentary Yes   Intervention Provide advice, education, support and counseling about physical activity/exercise needs.;Develop an individualized exercise prescription for aerobic and resistive training based on initial evaluation findings, risk stratification, comorbidities and participant's personal goals.    Expected Outcomes Achievement of increased cardiorespiratory fitness and enhanced flexibility, muscular endurance and strength shown through measurements of functional capacity and personal statement of participant.   Increase Strength and Stamina Yes   Intervention Provide advice, education, support and counseling about physical activity/exercise needs.;Develop an individualized exercise prescription for aerobic and resistive training based on initial evaluation findings, risk stratification, comorbidities and participant's personal goals.   Expected Outcomes Achievement of increased cardiorespiratory fitness and enhanced flexibility, muscular endurance and strength shown through measurements of functional capacity and personal statement of participant.   Improve shortness of breath with ADL's Yes   Intervention Provide education, individualized exercise plan and daily activity instruction to help decrease symptoms of SOB with activities of daily living.  Expected Outcomes Short Term: Achieves a reduction of symptoms when performing activities of daily living.   Develop more efficient breathing techniques such as purse lipped breathing and diaphragmatic breathing; and practicing self-pacing with activity Yes   Intervention Provide education, demonstration and support about specific breathing techniuqes utilized for more efficient breathing. Include techniques such as pursed lipped breathing, diaphragmatic breathing and self-pacing activity.   Expected Outcomes Short Term: Participant will be able to demonstrate and use breathing techniques as needed throughout daily activities.   Increase knowledge of respiratory medications and ability to use respiratory devices properly  Yes   Intervention Provide education and demonstration as needed of appropriate use of medications, inhalers, and oxygen therapy.   Expected Outcomes Short Term: Achieves understanding of medications use. Understands that oxygen is a  medication prescribed by physician. Demonstrates appropriate use of inhaler and oxygen therapy.   Diabetes Yes   Intervention Provide education about signs/symptoms and action to take for hypo/hyperglycemia.;Provide education about proper nutrition, including hydration, and aerobic/resistive exercise prescription along with prescribed medications to achieve blood glucose in normal ranges: Fasting glucose 65-99 mg/dL   Expected Outcomes Short Term: Participant verbalizes understanding of the signs/symptoms and immediate care of hyper/hypoglycemia, proper foot care and importance of medication, aerobic/resistive exercise and nutrition plan for blood glucose control.;Long Term: Attainment of HbA1C < 7%.   Heart Failure Yes   Intervention Provide a combined exercise and nutrition program that is supplemented with education, support and counseling about heart failure. Directed toward relieving symptoms such as shortness of breath, decreased exercise tolerance, and extremity edema.   Expected Outcomes Improve functional capacity of life;Short term: Attendance in program 2-3 days a week with increased exercise capacity. Reported lower sodium intake. Reported increased fruit and vegetable intake. Reports medication compliance.;Short term: Daily weights obtained and reported for increase. Utilizing diuretic protocols set by physician.;Long term: Adoption of self-care skills and reduction of barriers for early signs and symptoms recognition and intervention leading to self-care maintenance.   Hypertension Yes   Intervention Provide education on lifestyle modifcations including regular physical activity/exercise, weight management, moderate sodium restriction and increased consumption of fresh fruit, vegetables, and low fat dairy, alcohol moderation, and smoking cessation.;Monitor prescription use compliance.   Expected Outcomes Short Term: Continued assessment and intervention until BP is < 140/55mm HG in  hypertensive participants. < 130/25mm HG in hypertensive participants with diabetes, heart failure or chronic kidney disease.;Long Term: Maintenance of blood pressure at goal levels.   Lipids Yes   Intervention Provide education and support for participant on nutrition & aerobic/resistive exercise along with prescribed medications to achieve LDL 70mg , HDL >40mg .   Expected Outcomes Short Term: Participant states understanding of desired cholesterol values and is compliant with medications prescribed. Participant is following exercise prescription and nutrition guidelines.;Long Term: Cholesterol controlled with medications as prescribed, with individualized exercise RX and with personalized nutrition plan. Value goals: LDL < 70mg , HDL > 40 mg.       Personal Goals Discharge:     Goals and Risk Factor Review    Row Name 02/07/16 1515             Core Components/Risk Factors/Patient Goals Review   Personal Goals Review Sedentary;Increase Strength and Stamina;Develop more efficient breathing techniques such as purse lipped breathing and diaphragmatic breathing and practicing self-pacing with activity.       Review [name] Goelz has attended 3 sessions of pulmonary rehab.  She has obtained her exercise goals on the BioStep and T5 and performed  interval training on the treadmill. Her PLB technique is good and her vital signs are acceptable. She is compliant with her diabetes, lipids, and blood pressure medications.       Expected Outcomes Continue exercising in [facility] to increase stamina and strength.          Nutrition & Weight - Outcomes:    Nutrition:   Nutrition Discharge:   Education Questionnaire Score:     Knowledge Questionnaire Score - 01/15/16 1613      Knowledge Questionnaire Score   Pre Score 10/10      Goals reviewed with patient; copy given to patient.

## 2016-05-29 ENCOUNTER — Other Ambulatory Visit: Payer: Self-pay

## 2016-05-29 DIAGNOSIS — D509 Iron deficiency anemia, unspecified: Secondary | ICD-10-CM

## 2016-05-31 ENCOUNTER — Ambulatory Visit: Payer: Medicaid Other | Admitting: Family

## 2016-06-02 NOTE — Progress Notes (Deleted)
[place]  [phone] [phone]) [phone]  ID: [name] OB: March 02, 1970  MR#: [id number]  TGY#:[id number]  Patient Care Team: [name], NP as PCP - General (Nurse Practitioner) Minna Merritts, MD as Consulting Physician (Cardiology) Alisa Graff, FNP as Nurse Practitioner (Family Medicine) Laverle Hobby, MD as Consulting Physician (Pulmonary Disease)  CHIEF COMPLAINT: Iron deficiency anemia.  INTERVAL HISTORY: Patient returns to clinic today for repeat laboratory work and further evaluation. She has chronic weakness and fatigue, that is essentially unchanged. Her blood pressure is significantly elevated, but she has no neurologic complaints. She states her blood pressure medication does not make her feel well and she has not taken it in several days. She also continues to have chronic shortness of breath and requires oxygen 24 hours per day.  She denies any fevers. She has no chest pain, cough, or hemoptysis. She denies any nausea, vomiting, consultation, or diarrhea. She has no melena or hematochezia. Patient otherwise feels well and offers no further specific complaints.  REVIEW OF SYSTEMS:   Review of Systems  Constitutional: Positive for malaise/fatigue. Negative for fever and weight loss.  Respiratory: Positive for shortness of breath. Negative for hemoptysis and sputum production.   Cardiovascular: Negative.  Negative for chest pain.  Gastrointestinal: Negative.  Negative for abdominal pain, blood in stool and melena.  Genitourinary: Negative.   Musculoskeletal: Negative.   Neurological: Positive for weakness.  Psychiatric/Behavioral: Negative.  The patient is not nervous/anxious.     As per HPI. Otherwise, a complete review of systems is negative.  PAST MEDICAL HISTORY: Past Medical History:  Diagnosis Date  . Asthma   . CHF (congestive heart failure) ([place])   . COPD (chronic obstructive pulmonary disease) ([place])   .  Diabetes mellitus without complication ([place])   . Hypertension   . Iron deficiency anemia   . Pericardial effusion    a. 11/2014 CT Chest: Mod effusion.  [name] Poorly controlled diabetes mellitus ([place])    a. 11/2014 A1c 13.2.    PAST SURGICAL HISTORY: Past Surgical History:  Procedure Laterality Date  . CESAREAN SECTION      FAMILY HISTORY Family History  Problem Relation Age of Onset  . Diabetes Mother   . Hypertension Mother   . Hypertension Father   . Diabetes Father   . Hypertension    . Diabetes    . Breast cancer Maternal Aunt 59  . Breast cancer Maternal Aunt 44       ADVANCED DIRECTIVES:    HEALTH MAINTENANCE: Social History  Substance Use Topics  . Smoking status: Never Smoker  . Smokeless tobacco: Never Used  . Alcohol use 0.0 oz/week     Comment: occ     Colonoscopy:  PAP:  Bone density:  Lipid panel:  Allergies  Allergen Reactions  . Feraheme [Ferumoxytol] Itching  . Sulfa Antibiotics Itching    Current Outpatient Prescriptions  Medication Sig Dispense Refill  . acetaminophen (TYLENOL) 500 MG tablet Take 1,000 mg by mouth every 6 (six) hours as needed for mild pain, fever or headache.    . albuterol (PROVENTIL HFA;VENTOLIN HFA) 108 (90 BASE) MCG/ACT inhaler Inhale 2 puffs into the lungs every 6 (six) hours as needed for wheezing or shortness of breath. 1 Inhaler 0  . amLODipine (NORVASC) 10 MG tablet Take 1 tablet (10 mg total) by mouth daily. 30 tablet 0  . aspirin EC 81 MG tablet Take 81 mg by mouth at bedtime.    . budesonide (  PULMICORT) 0.25 MG/2ML nebulizer solution Take 2 mLs (0.25 mg total) by nebulization 2 (two) times daily. 60 mL 12  . citalopram (CELEXA) 40 MG tablet Take 40 mg by mouth daily.    [name] guaiFENesin-dextromethorphan (ROBITUSSIN DM) 100-10 MG/5ML syrup Take 5 mLs by mouth every 4 (four) hours as needed for cough. 118 mL 0  . hydrALAZINE (APRESOLINE) 100 MG tablet Take 1 tablet (100 mg total) by mouth every 8 (eight) hours. 60  tablet 0  . insulin aspart (NOVOLOG FLEXPEN) 100 UNIT/ML FlexPen Inject 0-12 Units into the skin 3 (three) times daily with meals as needed for high blood sugar. Pt uses as needed per sliding scale.    . Insulin Degludec (TRESIBA FLEXTOUCH) 100 UNIT/ML SOPN Inject 45 Units into the skin at bedtime. 1 pen 2  . ipratropium-albuterol (DUONEB) 0.5-2.5 (3) MG/3ML SOLN Take 3 mLs by nebulization every 6 (six) hours as needed (for wheezing/shortness of breath).    . losartan (COZAAR) 50 MG tablet Take 1 tablet (50 mg total) by mouth daily. 30 tablet 5  . metFORMIN (GLUCOPHAGE) 500 MG tablet Take 500 mg by mouth 2 (two) times daily with a meal.    . metoprolol (LOPRESSOR) 50 MG tablet Take 50 mg by mouth 2 (two) times daily.    . potassium chloride (K-DUR,KLOR-CON) 10 MEQ tablet Take 1 tablet (10 mEq total) by mouth 2 (two) times daily. Can take an additional tablet when takes the metolazone 80 tablet 5  . simvastatin (ZOCOR) 40 MG tablet Take 40 mg by mouth at bedtime.    . torsemide (DEMADEX) 20 MG tablet Take 2 tablets (40 mg total) by mouth daily. 60 tablet 5   No current facility-administered medications for this visit.     OBJECTIVE: There were no vitals filed for this visit.   There is no height or weight on file to calculate BMI.    ECOG FS:1 - Symptomatic but completely ambulatory  General: Well-developed, well-nourished, no acute distress. Eyes: Pink conjunctiva, anicteric sclera. Lungs: Clear to auscultation bilaterally. Heart: Regular rate and rhythm. No rubs, murmurs, or gallops. Abdomen: Soft, nontender, nondistended. No organomegaly noted, normoactive bowel sounds. Musculoskeletal: No edema, cyanosis, or clubbing. Neuro: Alert, answering all questions appropriately. Cranial nerves grossly intact. Skin: No rashes or petechiae noted. Psych: Normal affect.   LAB RESULTS:  Lab Results  Component Value Date   NA 138 04/12/2016   K 3.5 04/12/2016   CL 90 (L) 04/12/2016   CO2 39  (H) 04/12/2016   GLUCOSE 288 (H) 04/12/2016   BUN 20 04/12/2016   CREATININE 1.48 (H) 04/12/2016   CALCIUM 8.1 (L) 04/12/2016   PROT 6.1 (L) 03/23/2016   ALBUMIN 2.5 (L) 03/23/2016   AST 19 03/23/2016   ALT 12 (L) 03/23/2016   ALKPHOS 118 03/23/2016   BILITOT 0.7 03/23/2016   GFRNONAA 42 (L) 04/12/2016   GFRAA 49 (L) 04/12/2016    Lab Results  Component Value Date   WBC 6.4 03/23/2016   NEUTROABS 5.4 03/23/2016   HGB 10.1 (L) 03/23/2016   HCT 31.9 (L) 03/23/2016   MCV 86.4 03/23/2016   PLT 252 03/23/2016   Lab Results  Component Value Date   IRON 55 03/05/2016   TIBC 266 03/05/2016   IRONPCTSAT 21 03/05/2016    Lab Results  Component Value Date   FERRITIN 44 03/05/2016     STUDIES: No results found.  ASSESSMENT: Iron deficiency anemia.  PLAN:    1. Iron deficiency anemia: Likely secondary to  heavy menses. [name] hemoglobin is decreased, but her iron stores are now within normal limits. She does not require IV Feraheme today. Previously, patient had a mild allergic reaction to Feraheme therefore in addition to her treatment she will receive 20 mg of dexamethasone, 25 mg Benadryl, 50 mg ranitidine, and 650 mg of Tylenol.  No intervention is needed at this time. Return to clinic in 3 months for repeat laboratory work and further evaluation. 2. Shortness of Breath: Continue oxygen as prescribed. 3. Hypertension: [name] blood pressure is definitely elevated today. Patient was encouraged to take her blood pressure medications as prescribed. Treatment per PCP.  Patient expressed understanding and was in agreement with this plan. She also understands that She can call clinic at any time with any questions, concerns, or complaints.    Lloyd Huger, MD   06/02/2016 10:19 PM

## 2016-06-03 ENCOUNTER — Inpatient Hospital Stay: Payer: Self-pay | Admitting: Oncology

## 2016-06-03 ENCOUNTER — Inpatient Hospital Stay: Payer: Self-pay

## 2016-06-07 ENCOUNTER — Ambulatory Visit: Payer: Medicaid Other | Attending: Family | Admitting: Family

## 2016-06-07 ENCOUNTER — Encounter: Payer: Self-pay | Admitting: Family

## 2016-06-07 VITALS — BP 180/110 | HR 87 | Resp 20 | Ht 64.0 in | Wt 338.1 lb

## 2016-06-07 DIAGNOSIS — E119 Type 2 diabetes mellitus without complications: Secondary | ICD-10-CM | POA: Diagnosis not present

## 2016-06-07 DIAGNOSIS — Z5189 Encounter for other specified aftercare: Secondary | ICD-10-CM | POA: Diagnosis present

## 2016-06-07 DIAGNOSIS — Z79899 Other long term (current) drug therapy: Secondary | ICD-10-CM | POA: Diagnosis not present

## 2016-06-07 DIAGNOSIS — D509 Iron deficiency anemia, unspecified: Secondary | ICD-10-CM | POA: Diagnosis not present

## 2016-06-07 DIAGNOSIS — J449 Chronic obstructive pulmonary disease, unspecified: Secondary | ICD-10-CM | POA: Diagnosis not present

## 2016-06-07 DIAGNOSIS — Z8249 Family history of ischemic heart disease and other diseases of the circulatory system: Secondary | ICD-10-CM | POA: Insufficient documentation

## 2016-06-07 DIAGNOSIS — Z794 Long term (current) use of insulin: Secondary | ICD-10-CM | POA: Insufficient documentation

## 2016-06-07 DIAGNOSIS — Z833 Family history of diabetes mellitus: Secondary | ICD-10-CM | POA: Diagnosis not present

## 2016-06-07 DIAGNOSIS — Z803 Family history of malignant neoplasm of breast: Secondary | ICD-10-CM | POA: Insufficient documentation

## 2016-06-07 DIAGNOSIS — Z7982 Long term (current) use of aspirin: Secondary | ICD-10-CM | POA: Diagnosis not present

## 2016-06-07 DIAGNOSIS — Z9981 Dependence on supplemental oxygen: Secondary | ICD-10-CM | POA: Diagnosis not present

## 2016-06-07 DIAGNOSIS — I110 Hypertensive heart disease with heart failure: Secondary | ICD-10-CM | POA: Insufficient documentation

## 2016-06-07 DIAGNOSIS — I5033 Acute on chronic diastolic (congestive) heart failure: Secondary | ICD-10-CM

## 2016-06-07 DIAGNOSIS — I509 Heart failure, unspecified: Secondary | ICD-10-CM | POA: Diagnosis not present

## 2016-06-07 DIAGNOSIS — J410 Simple chronic bronchitis: Secondary | ICD-10-CM

## 2016-06-07 DIAGNOSIS — E669 Obesity, unspecified: Secondary | ICD-10-CM | POA: Diagnosis not present

## 2016-06-07 DIAGNOSIS — I10 Essential (primary) hypertension: Secondary | ICD-10-CM

## 2016-06-07 MED ORDER — METOLAZONE 5 MG PO TABS: 5.0000 mg | ORAL_TABLET | Freq: Every day | ORAL | 0 refills | Status: DC

## 2016-06-07 NOTE — Patient Instructions (Signed)
Add metolazone 5mg  daily for the next 3 days. Take an additional potassium tablet over the next 3 days as well.  Continue weighing daily and call for an overnight weight gain of > 2 pounds or a weekly weight gain of >5 pounds.

## 2016-06-07 NOTE — Progress Notes (Signed)
Patient ID: [name], female    DOB: 11/27/1970, 46 y.o.   MRN: [id number]  HPI  [name] is a 46 y/o female with a history of DM, iron deficiency anemia, HTN, COPD with chronic oxygen use, asthma, obesity and chronic heart failure.   Last echo was done 12/15/14 and showed an EF of 50-55% without any valvular regurgitation.   Was recently in the ED on 03/23/16 with exacerbation of HF. Was treated and released. Prior ED visit was 02/23/16 for acute cystitis.  She presents today for a follow-up visit with a chief complaint of moderate shortness of breath with little exertion. She says that she's chronically short of breath and has been for "a long time" but it's gotten worse over the last couple of days. She is coughing up thick green sputum and also has worsening fatigue, chest tightness and edema in her legs. Weight has gone up at home some as well.   Past Medical History:  Diagnosis Date  . Asthma   . CHF (congestive heart failure) ([place])   . COPD (chronic obstructive pulmonary disease) ([place])   . Diabetes mellitus without complication ([place])   . Hypertension   . Iron deficiency anemia   . Pericardial effusion    a. 11/2014 CT Chest: Mod effusion.  [name] Poorly controlled diabetes mellitus ([place])    a. 11/2014 A1c 13.2.   Past Surgical History:  Procedure Laterality Date  . CESAREAN SECTION     Family History  Problem Relation Age of Onset  . Diabetes Mother   . Hypertension Mother   . Hypertension Father   . Diabetes Father   . Hypertension    . Diabetes    . Breast cancer Maternal Aunt 56  . Breast cancer Maternal Aunt 51   Social History  Substance Use Topics  . Smoking status: Never Smoker  . Smokeless tobacco: Never Used  . Alcohol use 0.0 oz/week     Comment: occ   Allergies  Allergen Reactions  . Feraheme [Ferumoxytol] Itching  . Sulfa Antibiotics Itching   Prior to Admission medications   Medication Sig Start Date End Date Taking? Authorizing Provider   acetaminophen (TYLENOL) 500 MG tablet Take 1,000 mg by mouth every 6 (six) hours as needed for mild pain, fever or headache.   Yes Historical Provider, MD  albuterol (PROVENTIL HFA;VENTOLIN HFA) 108 (90 BASE) MCG/ACT inhaler Inhale 2 puffs into the lungs every 6 (six) hours as needed for wheezing or shortness of breath. 12/19/14  Yes Dustin Flock, MD  amLODipine (NORVASC) 10 MG tablet Take 1 tablet (10 mg total) by mouth daily. 12/19/14  Yes Dustin Flock, MD  aspirin EC 81 MG tablet Take 81 mg by mouth at bedtime.   Yes Historical Provider, MD  budesonide (PULMICORT) 0.25 MG/2ML nebulizer solution Take 2 mLs (0.25 mg total) by nebulization 2 (two) times daily. 12/19/14  Yes Dustin Flock, MD  citalopram (CELEXA) 40 MG tablet Take 40 mg by mouth daily.   Yes Historical Provider, MD  guaiFENesin-dextromethorphan (ROBITUSSIN DM) 100-10 MG/5ML syrup Take 5 mLs by mouth every 4 (four) hours as needed for cough. 08/13/15  Yes Demetrios Loll, MD  hydrALAZINE (APRESOLINE) 100 MG tablet Take 1 tablet (100 mg total) by mouth every 8 (eight) hours. 12/19/14  Yes Dustin Flock, MD  insulin aspart (NOVOLOG FLEXPEN) 100 UNIT/ML FlexPen Inject 0-12 Units into the skin 3 (three) times daily with meals as needed for high blood sugar. Pt uses as needed per sliding  scale.   Yes Historical Provider, MD  Insulin Degludec (TRESIBA FLEXTOUCH) 100 UNIT/ML SOPN Inject 45 Units into the skin at bedtime. 08/13/15  Yes Demetrios Loll, MD  ipratropium-albuterol (DUONEB) 0.5-2.5 (3) MG/3ML SOLN Take 3 mLs by nebulization every 6 (six) hours as needed (for wheezing/shortness of breath).   Yes Historical Provider, MD  losartan (COZAAR) 50 MG tablet Take 1 tablet (50 mg total) by mouth daily. 03/25/16  Yes Alisa Graff, FNP  metFORMIN (GLUCOPHAGE) 500 MG tablet Take 500 mg by mouth 2 (two) times daily with a meal.   Yes Historical Provider, MD  metoprolol (LOPRESSOR) 50 MG tablet Take 50 mg by mouth 2 (two) times daily.   Yes Historical  Provider, MD  potassium chloride (K-DUR,KLOR-CON) 10 MEQ tablet Take 1 tablet (10 mEq total) by mouth 2 (two) times daily. Can take an additional tablet when takes the metolazone 04/12/16  Yes Alisa Graff, FNP  simvastatin (ZOCOR) 40 MG tablet Take 40 mg by mouth at bedtime.   Yes Historical Provider, MD  torsemide (DEMADEX) 20 MG tablet Take 2 tablets (40 mg total) by mouth daily. 03/25/16  Yes Alisa Graff, FNP  metolazone (ZAROXOLYN) 5 MG tablet Take 1 tablet (5 mg total) by mouth daily. 06/07/16 09/05/16  Alisa Graff, FNP    Review of Systems  Constitutional: Positive for appetite change and fatigue.  HENT: Positive for congestion and postnasal drip. Negative for sinus pressure.   Eyes: Negative.   Respiratory: Positive for cough (thick green sputum), chest tightness and shortness of breath.   Cardiovascular: Positive for leg swelling. Negative for palpitations.  Gastrointestinal: Negative for abdominal distention and abdominal pain.  Endocrine: Negative.   Genitourinary: Positive for menstrual problem (no menstrual cycle). Negative for vaginal pain.  Musculoskeletal: Negative for neck pain.  Skin: Negative.   Allergic/Immunologic: Negative.   Neurological: Positive for light-headedness and headaches.  Hematological: Negative for adenopathy. Does not bruise/bleed easily.  Psychiatric/Behavioral: Negative for dysphoric mood, sleep disturbance and suicidal ideas. The patient is nervous/anxious.    Vitals:   06/07/16 0957  BP: (!) 180/110  Pulse: 87  Resp: 20  SpO2: 92%  Weight: (!) 338 lb 2 oz (153.4 kg)  Height: 5\' 4"  (1.626 m)   Wt Readings from Last 3 Encounters:  06/07/16 (!) 338 lb 2 oz (153.4 kg)  04/26/16 (!) 328 lb 4 oz (148.9 kg)  04/12/16 (!) 337 lb (152.9 kg)   Lab Results  Component Value Date   CREATININE 1.48 (H) 04/12/2016   CREATININE 1.69 (H) 03/29/2016   CREATININE 1.88 (H) 03/23/2016    Physical Exam  Constitutional: She is oriented to person,  place, and time. She appears well-developed and well-nourished.  HENT:  Head: Normocephalic and atraumatic.  Neck: Normal range of motion. Neck supple. JVD present.  Cardiovascular: Normal rate and regular rhythm.   Pulmonary/Chest: Effort normal. She has no wheezes. She has no rales.  Abdominal: She exhibits distension. There is no tenderness.  Musculoskeletal: She exhibits edema (1+ pitting edema in bilateral lower legs). She exhibits no tenderness.  Neurological: She is alert and oriented to person, place, and time.  Skin: Skin is warm and dry.  Psychiatric: She has a normal mood and affect. Her behavior is normal. Thought content normal.  Nursing note and vitals reviewed.   Assessment & Plan:  1: Acute heart failure with preserved ejection fraction- - NYHA class III - moderately fluid overloaded with increased edema/weight - weight up 10 pounds since she  was last here. Continue weighing daily and call for a weight gain of >2 pounds, weekly weight gain of > 5 pounds, worsening swelling or worsening shortness of breath - continue daily torsemide - add 5mg  metolazone daily for the next 3 days along with an extra potassium tablet those days as well -  BMP done 04/12/16 reviewed and shows potassium 3.5 with GFR 49. - check a BMP next week - last saw cardiologist Rockey Situ) 12/25/15. Needs echo repeated. Has an appointment scheduled with him on 06/17/16.  2: COPD- - patient has to make an appointment to have pulmonary function tests completed before an appointment can be made with pulmonologist Ashby Dawes). Last was seen 01/04/16 - needs to resume pulmonary rehab to increase her endurance, increase muscle strength and begin to lose weight - wears oxygen on a pulse setting at home at 4L around the clock  3: HTN- - BP elevated but she's 10 pounds heavier - adding metolazone above - sees PCP on 04/29/16  Patient did not bring her medications nor a list. Each medication was verbally  reviewed with the patient and she was encouraged to bring the bottles to every visit to confirm accuracy of list.  Return here next week for BMP and recheck of symptoms

## 2016-06-10 ENCOUNTER — Ambulatory Visit: Payer: Medicaid Other | Admitting: Family

## 2016-06-10 ENCOUNTER — Telehealth: Payer: Self-pay

## 2016-06-10 NOTE — Telephone Encounter (Signed)
[name] called and left a message on the machine that she will not be coming in for her appointment on 06/10/2016 for her lab recheck, due to the fact that she can not afford to pick up her medication that was prescribed.

## 2016-06-16 NOTE — Progress Notes (Deleted)
Cardiology Office Note  Date:  06/16/2016   ID:  [name], DOB 1970-10-13, MRN [id number]  PCP:  [name], NP   No chief complaint on file.   HPI:  46 yo  woman with  morbidly obese uncontrolled diabetes  , HBA1C 11 chronic diastolic CHF,  Prior history of anemia, asthma chronic cough  on oxygen, 2 L nasal cannula Chronic anemia, Iron low, getting infusions cancer clinic, heavy cycles  recently hospitalized, d/c from [facility] 07/2015: Hx of Bronchitis, hypoxia/PNA hospitalization October 2016, November 2016  who presents for  follow-up of her diastolic CHF.  Seen in the ER 03/23/16 for SOB  She is taking torsemide 3 x a week, Denies having significant leg edema   when she does retain fluid, she puts fluid in ABD and chest,  Reports that she currently feels okay  Hemoglobin A1c continues to run greater than 11  She reports having recent CT scan through [facility]. Results not available  Frustrated that she does not feel well when she walks, She is interested in retrying pulmonary rehabilitation She tried rehabilitation one time but was discouraged by her inability to exert herself. Continued symptoms of shortness of breath on exertion Reports she is scheduled to have a walker delivered  EKG on today's visit shows normal sinus rhythm with rate 85 bpm, no significant ST or T-wave changes  Other past medical history reviewed Previously had  follow-up with oncology for iron supplement.  Low iron felt secondary to heavy menses. Lab work from October 2016 showing hemoglobin A1c of 13,  Most recent hematocrit improved up to 33   she denies any symptoms of sleep apnea, reports that she had a test several years ago.     normal LV function by echo   PMH:   has a past medical history of Asthma; CHF (congestive heart failure) ([place]); COPD (chronic obstructive pulmonary disease) ([place]); Diabetes mellitus without complication ([place]); Hypertension; Iron deficiency  anemia; Pericardial effusion; and Poorly controlled diabetes mellitus ([place]).  PSH:    Past Surgical History:  Procedure Laterality Date  . CESAREAN SECTION      Current Outpatient Prescriptions  Medication Sig Dispense Refill  . acetaminophen (TYLENOL) 500 MG tablet Take 1,000 mg by mouth every 6 (six) hours as needed for mild pain, fever or headache.    . albuterol (PROVENTIL HFA;VENTOLIN HFA) 108 (90 BASE) MCG/ACT inhaler Inhale 2 puffs into the lungs every 6 (six) hours as needed for wheezing or shortness of breath. 1 Inhaler 0  . amLODipine (NORVASC) 10 MG tablet Take 1 tablet (10 mg total) by mouth daily. 30 tablet 0  . aspirin EC 81 MG tablet Take 81 mg by mouth at bedtime.    . budesonide (PULMICORT) 0.25 MG/2ML nebulizer solution Take 2 mLs (0.25 mg total) by nebulization 2 (two) times daily. 60 mL 12  . citalopram (CELEXA) 40 MG tablet Take 40 mg by mouth daily.    [name] guaiFENesin-dextromethorphan (ROBITUSSIN DM) 100-10 MG/5ML syrup Take 5 mLs by mouth every 4 (four) hours as needed for cough. 118 mL 0  . hydrALAZINE (APRESOLINE) 100 MG tablet Take 1 tablet (100 mg total) by mouth every 8 (eight) hours. 60 tablet 0  . insulin aspart (NOVOLOG FLEXPEN) 100 UNIT/ML FlexPen Inject 0-12 Units into the skin 3 (three) times daily with meals as needed for high blood sugar. Pt uses as needed per sliding scale.    . Insulin Degludec (TRESIBA FLEXTOUCH) 100 UNIT/ML SOPN Inject 45 Units  into the skin at bedtime. 1 pen 2  . ipratropium-albuterol (DUONEB) 0.5-2.5 (3) MG/3ML SOLN Take 3 mLs by nebulization every 6 (six) hours as needed (for wheezing/shortness of breath).    . losartan (COZAAR) 50 MG tablet Take 1 tablet (50 mg total) by mouth daily. 30 tablet 5  . metFORMIN (GLUCOPHAGE) 500 MG tablet Take 500 mg by mouth 2 (two) times daily with a meal.    . metolazone (ZAROXOLYN) 5 MG tablet Take 1 tablet (5 mg total) by mouth daily. 20 tablet 0  . metoprolol (LOPRESSOR) 50 MG tablet Take 50 mg by  mouth 2 (two) times daily.    . potassium chloride (K-DUR,KLOR-CON) 10 MEQ tablet Take 1 tablet (10 mEq total) by mouth 2 (two) times daily. Can take an additional tablet when takes the metolazone 80 tablet 5  . simvastatin (ZOCOR) 40 MG tablet Take 40 mg by mouth at bedtime.    . torsemide (DEMADEX) 20 MG tablet Take 2 tablets (40 mg total) by mouth daily. 60 tablet 5   No current facility-administered medications for this visit.      Allergies:   Feraheme [ferumoxytol] and Sulfa antibiotics   Social History:  The patient  reports that she has never smoked. She has never used smokeless tobacco. She reports that she drinks alcohol. She reports that she does not use drugs.   Family History:   family history includes Breast cancer (age of onset: 37) in her maternal aunt and maternal aunt; Diabetes in her father and mother; Hypertension in her father and mother.    Review of Systems: Review of Systems  Constitutional: Negative.   Respiratory: Positive for shortness of breath.   Cardiovascular: Negative.   Gastrointestinal: Negative.   Musculoskeletal: Negative.        Leg weakness, gait instability  Neurological: Negative.   Psychiatric/Behavioral: Negative.   All other systems reviewed and are negative.    PHYSICAL EXAM: VS:  LMP  (LMP Unknown)  , BMI There is no height or weight on file to calculate BMI. GEN: Well nourished, well developed, in no acute distress, morbidly obese , sitting in a wheelchair HEENT: normal  Neck: no JVD, carotid bruits, or masses Cardiac: RRR; no murmurs, rubs, or gallops,no edema  Respiratory:  clear to auscultation bilaterally, normal work of breathing GI: soft, nontender, nondistended, + BS MS: no deformity or atrophy  Skin: warm and dry, no rash Neuro:  Strength and sensation are intact Psych: euthymic mood, full affect    Recent Labs: 08/09/2015: TSH 3.193 08/11/2015: Magnesium 1.8 03/23/2016: ALT 12; B Natriuretic Peptide 337.0; Hemoglobin  10.1; Platelets 252 04/12/2016: BUN 20; Creatinine, Ser 1.48; Potassium 3.5; Sodium 138    Lipid Panel Lab Results  Component Value Date   CHOL 232 (H) 01/03/2015   HDL 41 01/03/2015   LDLCALC 169 (H) 01/03/2015   TRIG 112 01/03/2015      Wt Readings from Last 3 Encounters:  06/07/16 (!) 338 lb 2 oz (153.4 kg)  04/26/16 (!) 328 lb 4 oz (148.9 kg)  04/12/16 (!) 337 lb (152.9 kg)       ASSESSMENT AND PLAN:  Chronic diastolic congestive heart failure ([place]) - Plan: EKG 12-Lead, Ambulatory referral to Pulmonology She appears relatively euvolemic on today's visit Encouraged her to stay on torsemide 3 days per week, extra torsemide for any leg swelling, abdominal bloating  Essential hypertension - Plan: EKG 12-Lead, Ambulatory referral to Pulmonology Blood pressure mildly elevated on arrival, improved on recheck No changes  made to her medication  Asthma, unspecified asthma severity, unspecified whether complicated, unspecified whether persistent - Plan: Ambulatory referral to Pulmonology Currently on oxygen, appears to have chronic bronchitis, she reports history of asthma. Reports that she was previously on CPAP but her machine broke. We will refer to pulmonary for further consultation  On home oxygen therapy - Plan: Ambulatory referral to Pulmonology Started while she was in the hospital  Morbid obesity due to excess calories [facility]) Previously was thinking about gastric bypass surgery. History of poor diet  Poorly controlled type 2 diabetes mellitus ([place]) Hemoglobin A1c continues to be reported controlled She may benefit from referral to endocrinology Needs to work with nutritionist  Shortness of breath Stable symptoms, on oxygen, appears to have chronic bronchitis, thick cough. Less likely CHF Will refer to pulmonary  Iron deficiency anemia due to chronic blood loss Followed by Dr. Grayland Ormond, receiving iron infusions She reports heavy menstrual cycles Unclear she may  benefit from just on only birth control medication to minimize blood loss. Recommended she discuss with GYN   Total encounter time more than 25 minutes  Greater than 50% was spent in counseling and coordination of care with the patient   Disposition:   F/U  6 months   No orders of the defined types were placed in this encounter.    Signed, Esmond Plants, M.D., Ph.D. 06/16/2016  [place], [place]

## 2016-06-17 ENCOUNTER — Ambulatory Visit: Payer: Self-pay | Admitting: Cardiovascular Disease

## 2016-06-20 IMAGING — US US BREAST*R* LIMITED INC AXILLA
1 series · 11 of 11 positions shown · non-contrast
Comparison: Previous exam(s).

CLINICAL DATA: Screening recall for a possible right breast mass.

EXAM:
2D DIGITAL DIAGNOSTIC UNILATERAL RIGHT MAMMOGRAM WITH CAD AND
ADJUNCT TOMO
RIGHT BREAST ULTRASOUND

[Series 1: us breast*right* limited inc axilla · 0.08mm/px · 11 of 11 slices shown]
[im 1/11]
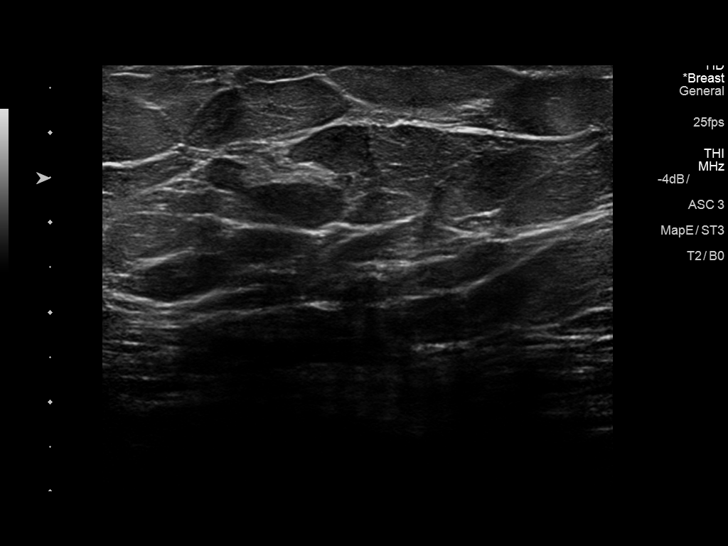
[im 2/11]
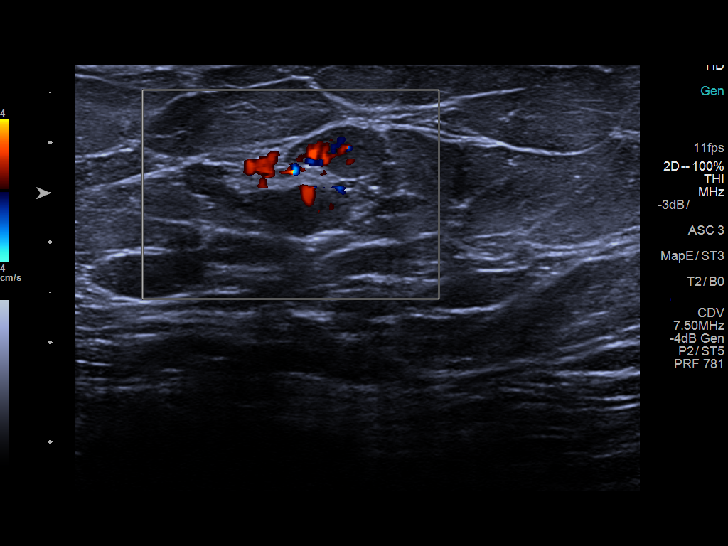
[im 3/11]
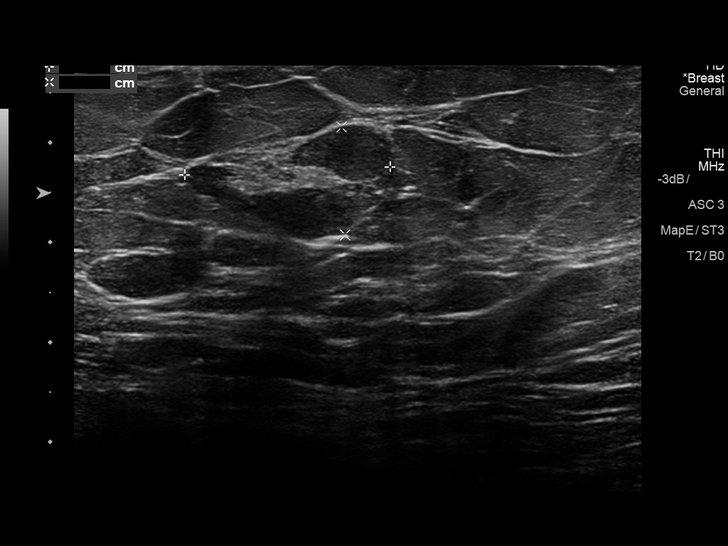
[im 4/11]
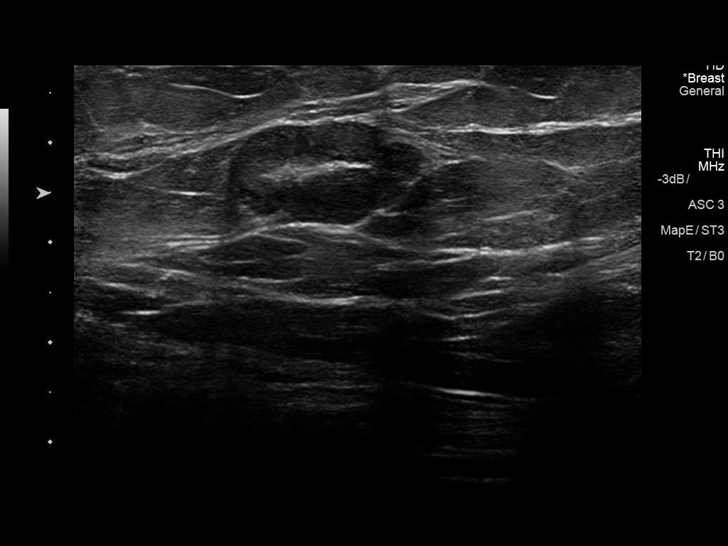
[im 5/11]
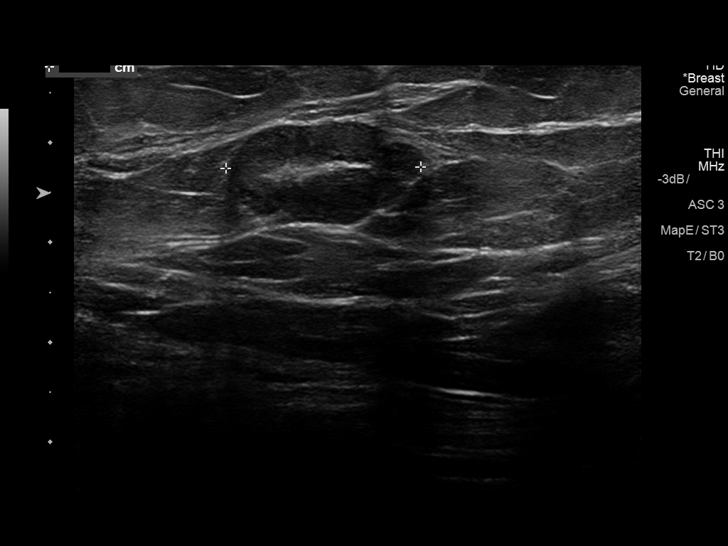
[im 6/11]
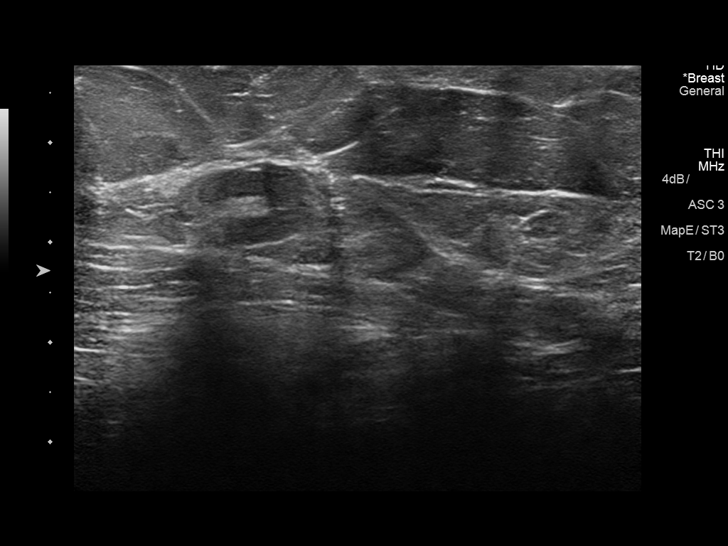
[im 7/11]
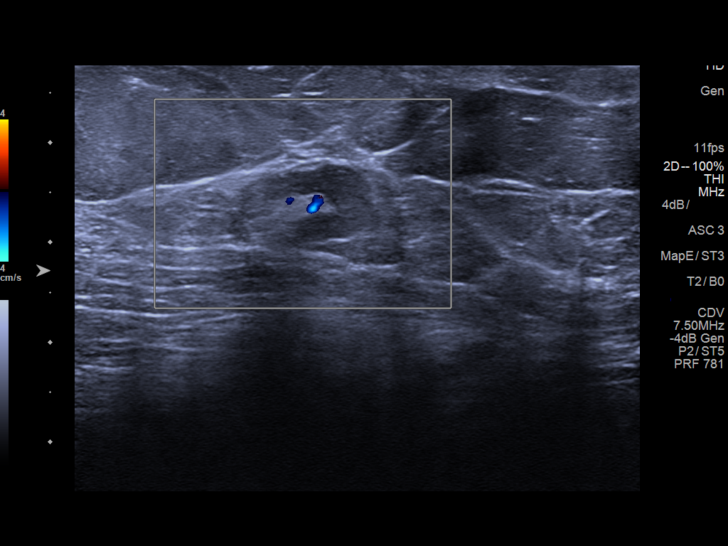
[im 8/11]
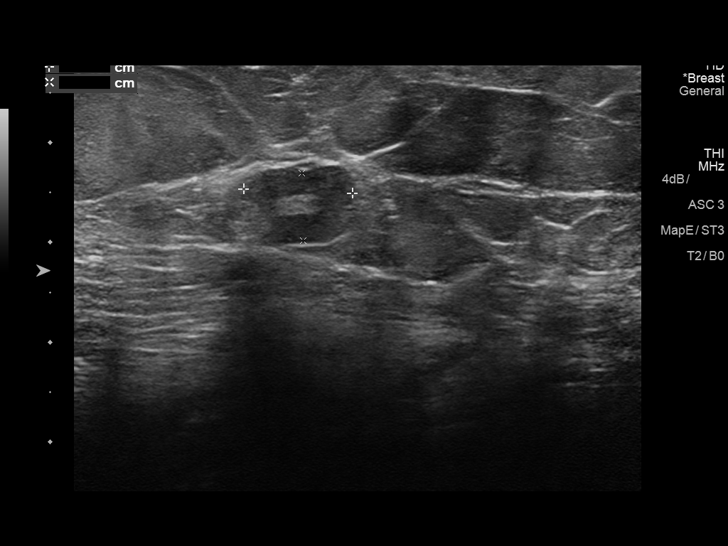
[im 9/11]
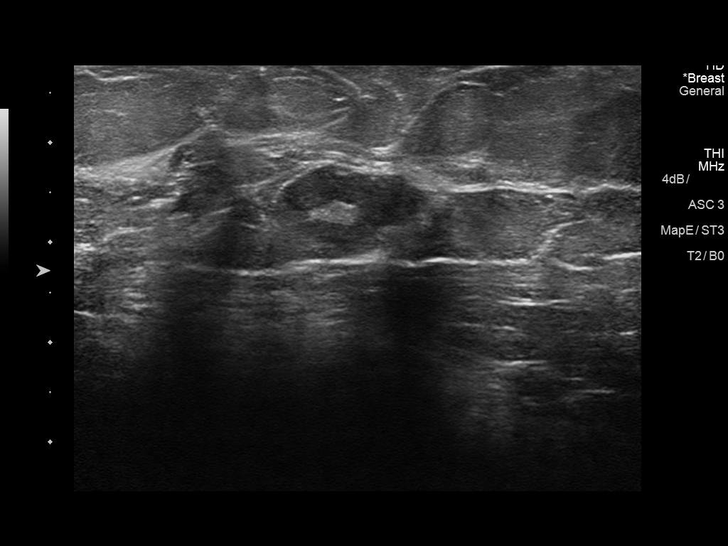
[im 10/11]
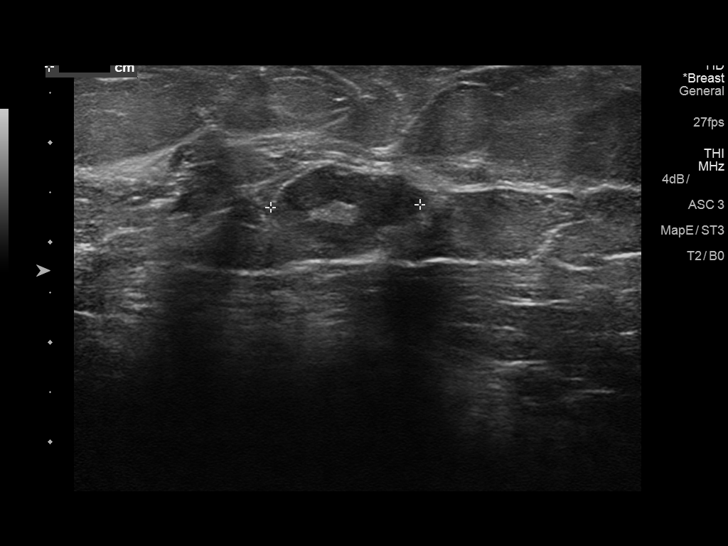
[im 11/11]
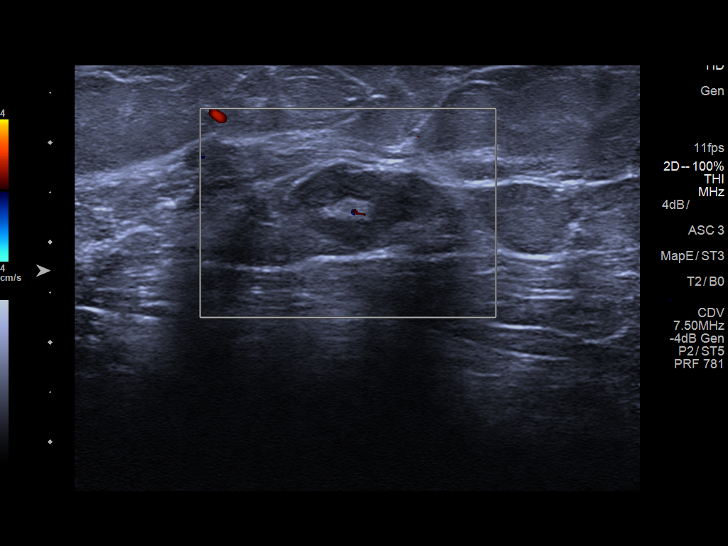

[11 of 11 positions shown; findings below may reference images not displayed]

ACR Breast Density Category b: There are scattered areas of
fibroglandular density.
FINDINGS: In the upper-outer quadrant of the right breast, there are to lymph
nodes which have been seen on multiple prior mammograms, however
have a mildly increased in size on the most recent mammogram. No
other suspicious calcifications, masses or areas of distortion are
seen in the right breast.

Mammographic images were processed with CAD.

Ultrasound targeted to the right breast at 10 o'clock, 15 cm from
the nipple demonstrates a circumscribed hypoechoic lymph node with a
preserved echogenic fatty hilum measuring up to 6 mm. The lymph node
in total measures 2.1 x 1.1 x 2.0 cm. A smaller lymph node is seen
adjacent to this measuring 1.1 x 0.7 x 1.5 cm.
IMPRESSION: There are 2 prominent lymph nodes in the upper-outer quadrant of the
right breast, which are probably benign. The patient states within
the last year or so she has been diagnosed with congestive heart
failure among other conditions, and has also experienced
intermittent hives and itching with unknown etiology, therefore the
enlargement may be due to reactive changes.

RECOMMENDATION:
Six-month follow-up right breast ultrasound is recommended.

I have discussed the findings and recommendations with the patient.
Results were also provided in writing at the conclusion of the
visit. If applicable, a reminder letter will be sent to the patient
regarding the next appointment.

BI-RADS CATEGORY  3: Probably benign.

## 2016-06-25 ENCOUNTER — Emergency Department: Payer: Medicaid Other

## 2016-06-25 ENCOUNTER — Encounter: Payer: Self-pay | Admitting: *Deleted

## 2016-06-25 ENCOUNTER — Inpatient Hospital Stay
Admission: EM | Admit: 2016-06-25 | Discharge: 2016-06-29 | DRG: 291 | Disposition: A | Discharge status: Home Health | Payer: Medicaid Other | Attending: Internal Medicine | Admitting: Internal Medicine

## 2016-06-25 DIAGNOSIS — Z91128 Patient's intentional underdosing of medication regimen for other reason: Secondary | ICD-10-CM | POA: Diagnosis not present

## 2016-06-25 DIAGNOSIS — E1122 Type 2 diabetes mellitus with diabetic chronic kidney disease: Secondary | ICD-10-CM | POA: Diagnosis present

## 2016-06-25 DIAGNOSIS — T502X6A Underdosing of carbonic-anhydrase inhibitors, benzothiadiazides and other diuretics, initial encounter: Secondary | ICD-10-CM | POA: Diagnosis present

## 2016-06-25 DIAGNOSIS — T380X5A Adverse effect of glucocorticoids and synthetic analogues, initial encounter: Secondary | ICD-10-CM | POA: Diagnosis present

## 2016-06-25 DIAGNOSIS — E1165 Type 2 diabetes mellitus with hyperglycemia: Secondary | ICD-10-CM | POA: Diagnosis present

## 2016-06-25 DIAGNOSIS — E873 Alkalosis: Secondary | ICD-10-CM | POA: Diagnosis not present

## 2016-06-25 DIAGNOSIS — J441 Chronic obstructive pulmonary disease with (acute) exacerbation: Secondary | ICD-10-CM | POA: Diagnosis present

## 2016-06-25 DIAGNOSIS — I110 Hypertensive heart disease with heart failure: Secondary | ICD-10-CM | POA: Diagnosis present

## 2016-06-25 DIAGNOSIS — N179 Acute kidney failure, unspecified: Secondary | ICD-10-CM | POA: Diagnosis present

## 2016-06-25 DIAGNOSIS — Z9181 History of falling: Secondary | ICD-10-CM

## 2016-06-25 DIAGNOSIS — Z794 Long term (current) use of insulin: Secondary | ICD-10-CM

## 2016-06-25 DIAGNOSIS — N920 Excessive and frequent menstruation with regular cycle: Secondary | ICD-10-CM | POA: Diagnosis present

## 2016-06-25 DIAGNOSIS — N183 Chronic kidney disease, stage 3 unspecified: Secondary | ICD-10-CM

## 2016-06-25 DIAGNOSIS — R0902 Hypoxemia: Secondary | ICD-10-CM | POA: Diagnosis not present

## 2016-06-25 DIAGNOSIS — J9621 Acute and chronic respiratory failure with hypoxia: Secondary | ICD-10-CM | POA: Diagnosis present

## 2016-06-25 DIAGNOSIS — D509 Iron deficiency anemia, unspecified: Secondary | ICD-10-CM | POA: Diagnosis present

## 2016-06-25 DIAGNOSIS — Z9981 Dependence on supplemental oxygen: Secondary | ICD-10-CM

## 2016-06-25 DIAGNOSIS — R531 Weakness: Secondary | ICD-10-CM | POA: Diagnosis present

## 2016-06-25 DIAGNOSIS — I5031 Acute diastolic (congestive) heart failure: Secondary | ICD-10-CM | POA: Diagnosis not present

## 2016-06-25 DIAGNOSIS — I130 Hypertensive heart and chronic kidney disease with heart failure and stage 1 through stage 4 chronic kidney disease, or unspecified chronic kidney disease: Secondary | ICD-10-CM | POA: Diagnosis present

## 2016-06-25 DIAGNOSIS — D631 Anemia in chronic kidney disease: Secondary | ICD-10-CM | POA: Diagnosis present

## 2016-06-25 DIAGNOSIS — E1121 Type 2 diabetes mellitus with diabetic nephropathy: Secondary | ICD-10-CM | POA: Diagnosis present

## 2016-06-25 DIAGNOSIS — J449 Chronic obstructive pulmonary disease, unspecified: Secondary | ICD-10-CM | POA: Diagnosis present

## 2016-06-25 DIAGNOSIS — N2581 Secondary hyperparathyroidism of renal origin: Secondary | ICD-10-CM | POA: Diagnosis present

## 2016-06-25 DIAGNOSIS — Z6841 Body Mass Index (BMI) 40.0 and over, adult: Secondary | ICD-10-CM

## 2016-06-25 DIAGNOSIS — J9620 Acute and chronic respiratory failure, unspecified whether with hypoxia or hypercapnia: Secondary | ICD-10-CM

## 2016-06-25 DIAGNOSIS — T501X6A Underdosing of loop [high-ceiling] diuretics, initial encounter: Secondary | ICD-10-CM | POA: Diagnosis present

## 2016-06-25 DIAGNOSIS — I5033 Acute on chronic diastolic (congestive) heart failure: Secondary | ICD-10-CM | POA: Diagnosis present

## 2016-06-25 DIAGNOSIS — Z9111 Patient's noncompliance with dietary regimen: Secondary | ICD-10-CM

## 2016-06-25 DIAGNOSIS — Z7951 Long term (current) use of inhaled steroids: Secondary | ICD-10-CM

## 2016-06-25 DIAGNOSIS — Z882 Allergy status to sulfonamides status: Secondary | ICD-10-CM

## 2016-06-25 DIAGNOSIS — J45909 Unspecified asthma, uncomplicated: Secondary | ICD-10-CM | POA: Diagnosis present

## 2016-06-25 DIAGNOSIS — R601 Generalized edema: Secondary | ICD-10-CM

## 2016-06-25 DIAGNOSIS — Z888 Allergy status to other drugs, medicaments and biological substances status: Secondary | ICD-10-CM

## 2016-06-25 DIAGNOSIS — Z7982 Long term (current) use of aspirin: Secondary | ICD-10-CM

## 2016-06-25 DIAGNOSIS — E119 Type 2 diabetes mellitus without complications: Secondary | ICD-10-CM | POA: Diagnosis present

## 2016-06-25 DIAGNOSIS — Z79899 Other long term (current) drug therapy: Secondary | ICD-10-CM

## 2016-06-25 LAB — CBC WITH DIFFERENTIAL/PLATELET
BASOS PCT: 1 %
Basophils Absolute: 0 10*3/uL (ref 0–0.1)
Eosinophils Absolute: 0.1 10*3/uL (ref 0–0.7)
Eosinophils Relative: 2 %
HCT: 30.4 % — ABNORMAL LOW (ref 35.0–47.0)
Hemoglobin: 9.4 g/dL — ABNORMAL LOW (ref 12.0–16.0)
LYMPHS ABS: 0.8 10*3/uL — AB (ref 1.0–3.6)
LYMPHS PCT: 13 %
MCH: 26.5 pg (ref 26.0–34.0)
MCHC: 31 g/dL — AB (ref 32.0–36.0)
MCV: 85.4 fL (ref 80.0–100.0)
MONO ABS: 0.4 10*3/uL (ref 0.2–0.9)
Monocytes Relative: 7 %
NEUTROS ABS: 4.8 10*3/uL (ref 1.4–6.5)
NEUTROS PCT: 77 %
Platelets: 390 10*3/uL (ref 150–440)
RBC: 3.56 MIL/uL — ABNORMAL LOW (ref 3.80–5.20)
RDW: 16.2 % — AB (ref 11.5–14.5)
WBC: 6.3 10*3/uL (ref 3.6–11.0)

## 2016-06-25 LAB — BASIC METABOLIC PANEL
ANION GAP: 6 (ref 5–15)
BUN: 27 mg/dL — ABNORMAL HIGH (ref 6–20)
CALCIUM: 8.2 mg/dL — AB (ref 8.9–10.3)
CO2: 38 mmol/L — AB (ref 22–32)
CREATININE: 2.09 mg/dL — AB (ref 0.44–1.00)
Chloride: 93 mmol/L — ABNORMAL LOW (ref 101–111)
GFR calc Af Amer: 32 mL/min — ABNORMAL LOW (ref >60)
GFR, EST NON AFRICAN AMERICAN: 28 mL/min — AB (ref >60)
Glucose, Bld: 211 mg/dL — ABNORMAL HIGH (ref 65–99)
Potassium: 3.8 mmol/L (ref 3.5–5.1)
Sodium: 137 mmol/L (ref 135–145)

## 2016-06-25 LAB — GLUCOSE, CAPILLARY
GLUCOSE-CAPILLARY: 287 mg/dL — AB (ref 65–99)
GLUCOSE-CAPILLARY: 336 mg/dL — AB (ref 65–99)

## 2016-06-25 LAB — BRAIN NATRIURETIC PEPTIDE: B Natriuretic Peptide: 475 pg/mL — ABNORMAL HIGH (ref 0.0–100.0)

## 2016-06-25 LAB — TROPONIN I: TROPONIN I: 0.03 ng/mL — AB (ref <0.03)

## 2016-06-25 IMAGING — DX DG KNEE COMPLETE 4+V*R*
4 series · 4 of 4 positions shown · non-contrast
Comparison: None.

CLINICAL DATA: 45-year-old female status post fall this morning
down a step with right knee injury. Pain. Initial encounter.

EXAM:
RIGHT KNEE - COMPLETE 4+ VIEW

[knee ap]
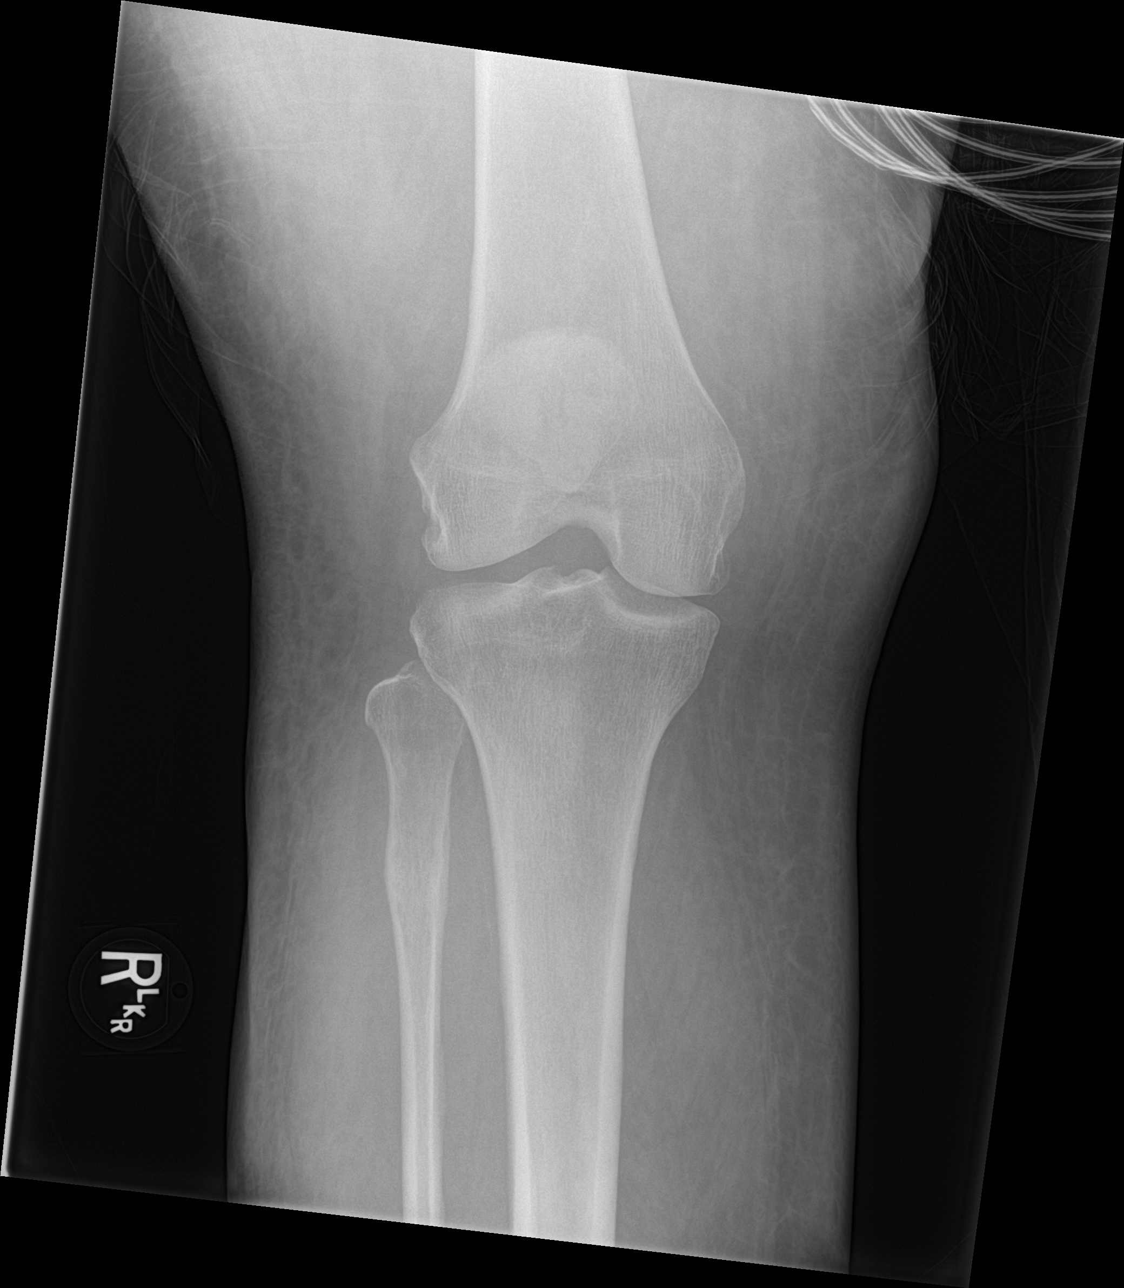

[knee tunnel]
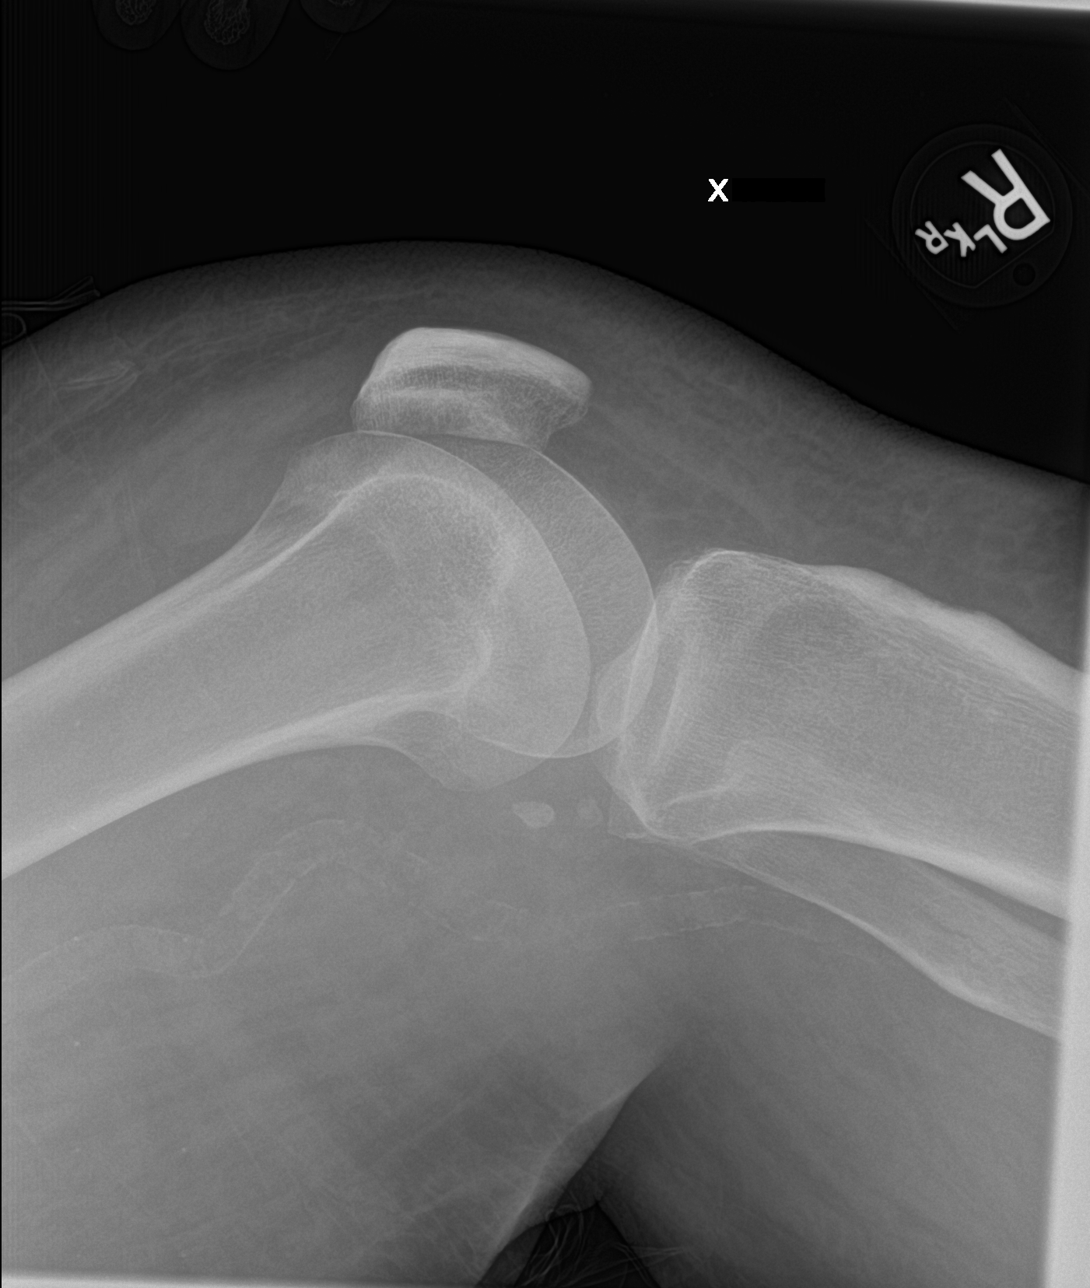

[knee obl (1 of 2)]
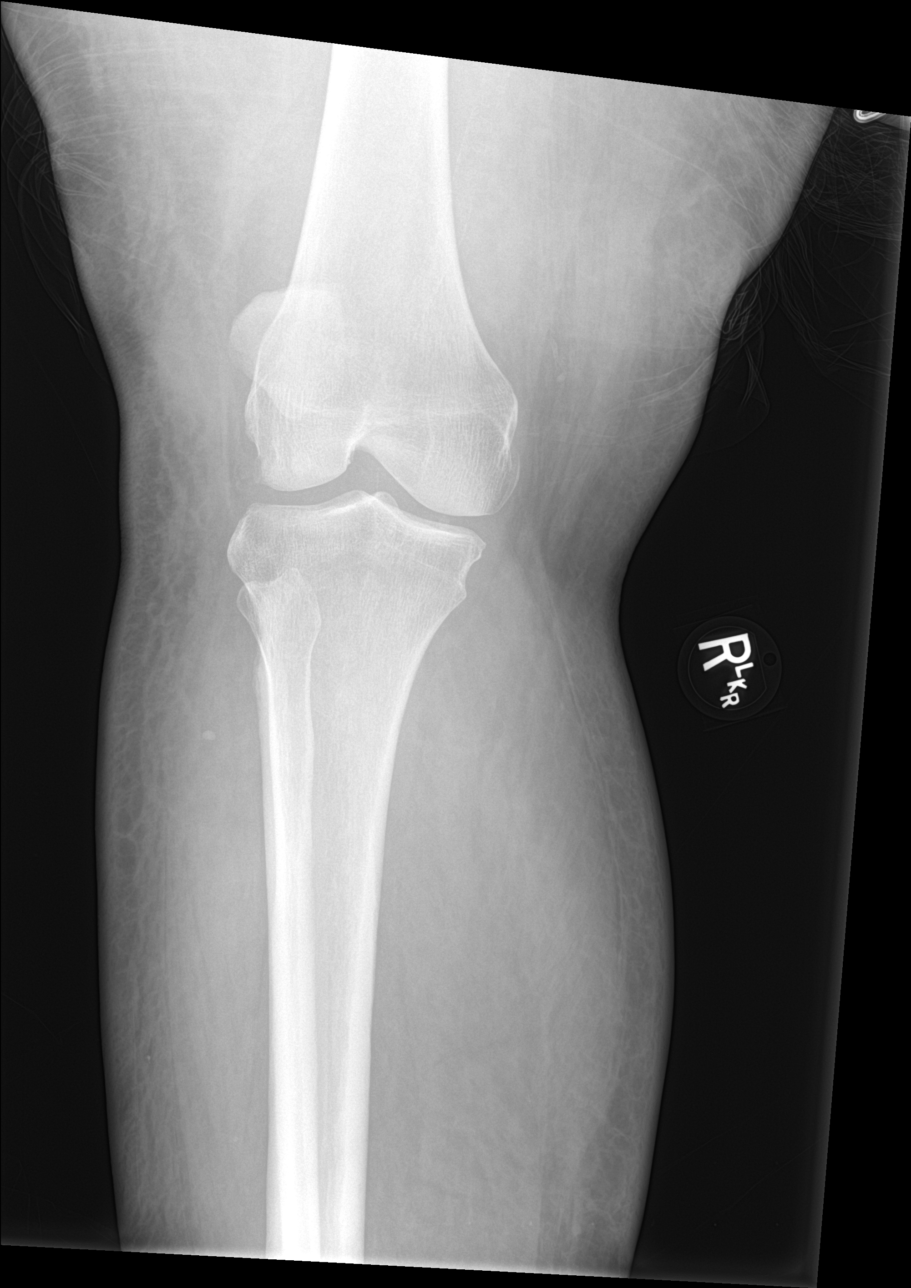

[knee obl (2 of 2)]
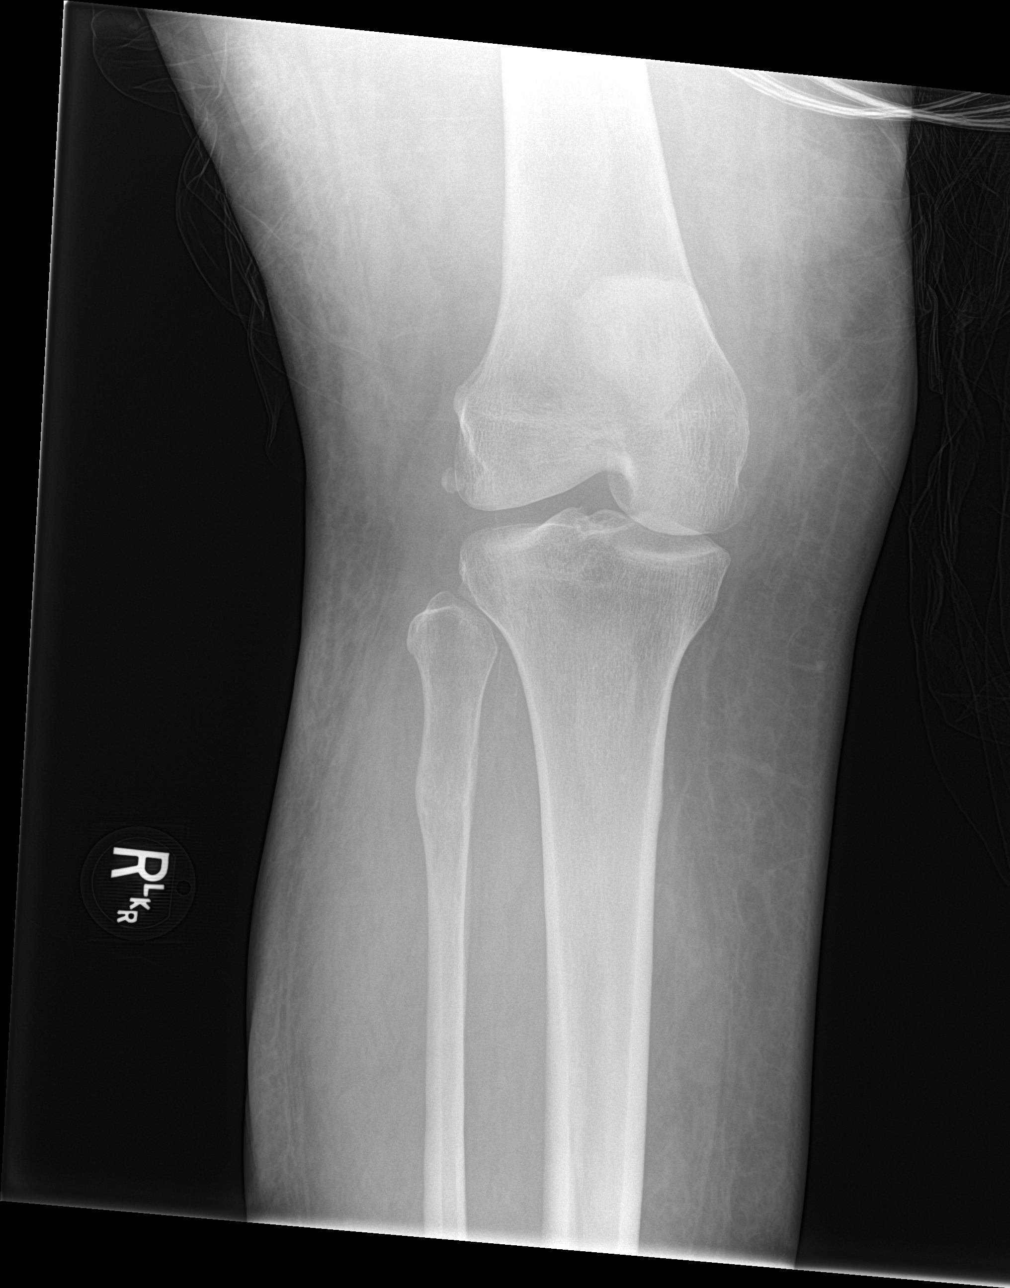

[4 of 4 positions shown; findings below may reference images not displayed]

FINDINGS: Evidence of widespread subcutaneous edema in the right lower
extremity. Calcified peripheral vascular disease in the distal right
femoral and popliteal artery is advanced for age. There is possibly
a healed deformity of the right fibula proximal metadiaphysis, but
no acute fracture or dislocation identified about the left knee.
Possible small joint effusion.
IMPRESSION: 1. No acute fracture or dislocation identified about the right knee.
Possible small joint effusion.
2. Calcified peripheral vascular disease and possibly widespread
soft tissue edema in the right lower extremity.

## 2016-06-25 IMAGING — DX DG CHEST 1V
2 series · 2 of 2 positions shown · non-contrast
Comparison: [DATE].

CLINICAL DATA: Shortness of breath.

EXAM:
CHEST 1 VIEW

[chest ap (1 of 2)]
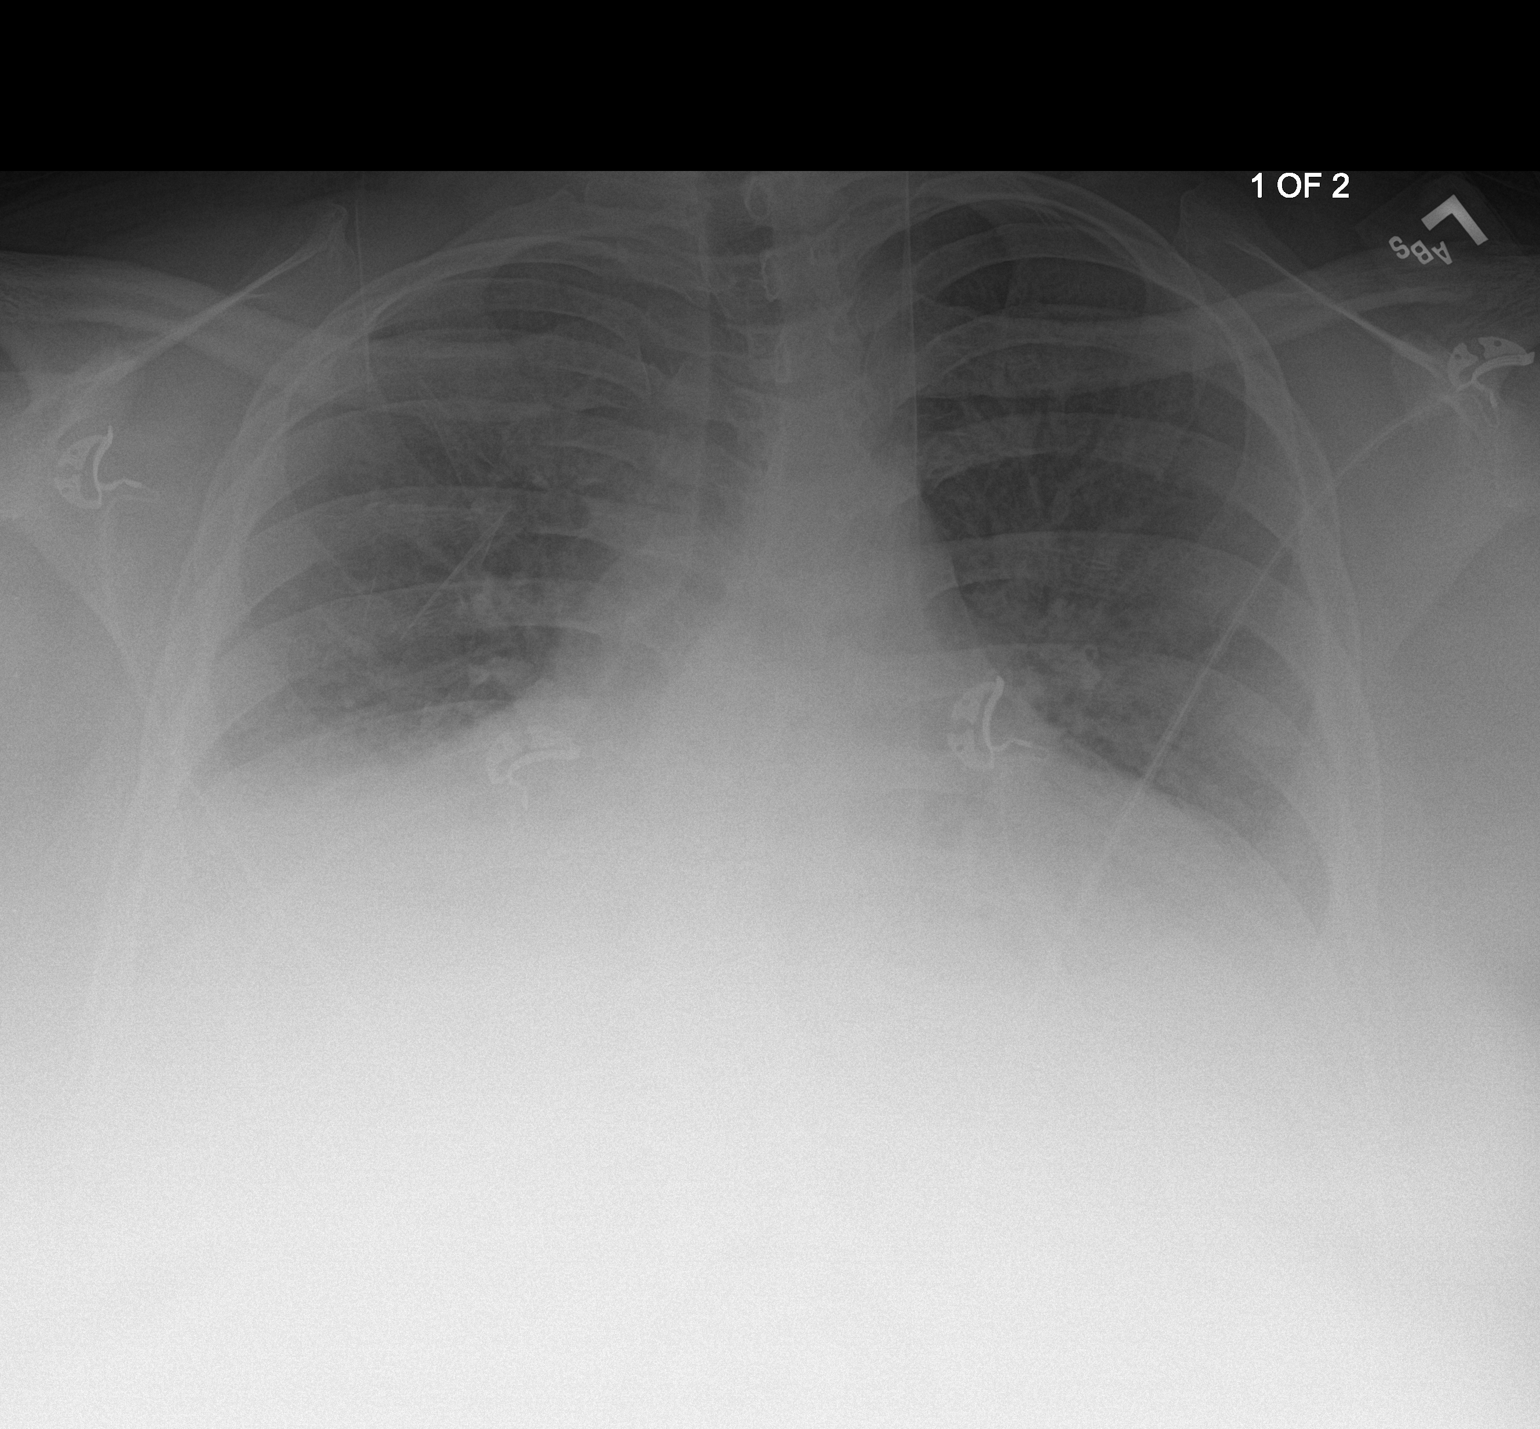

[chest ap (2 of 2)]
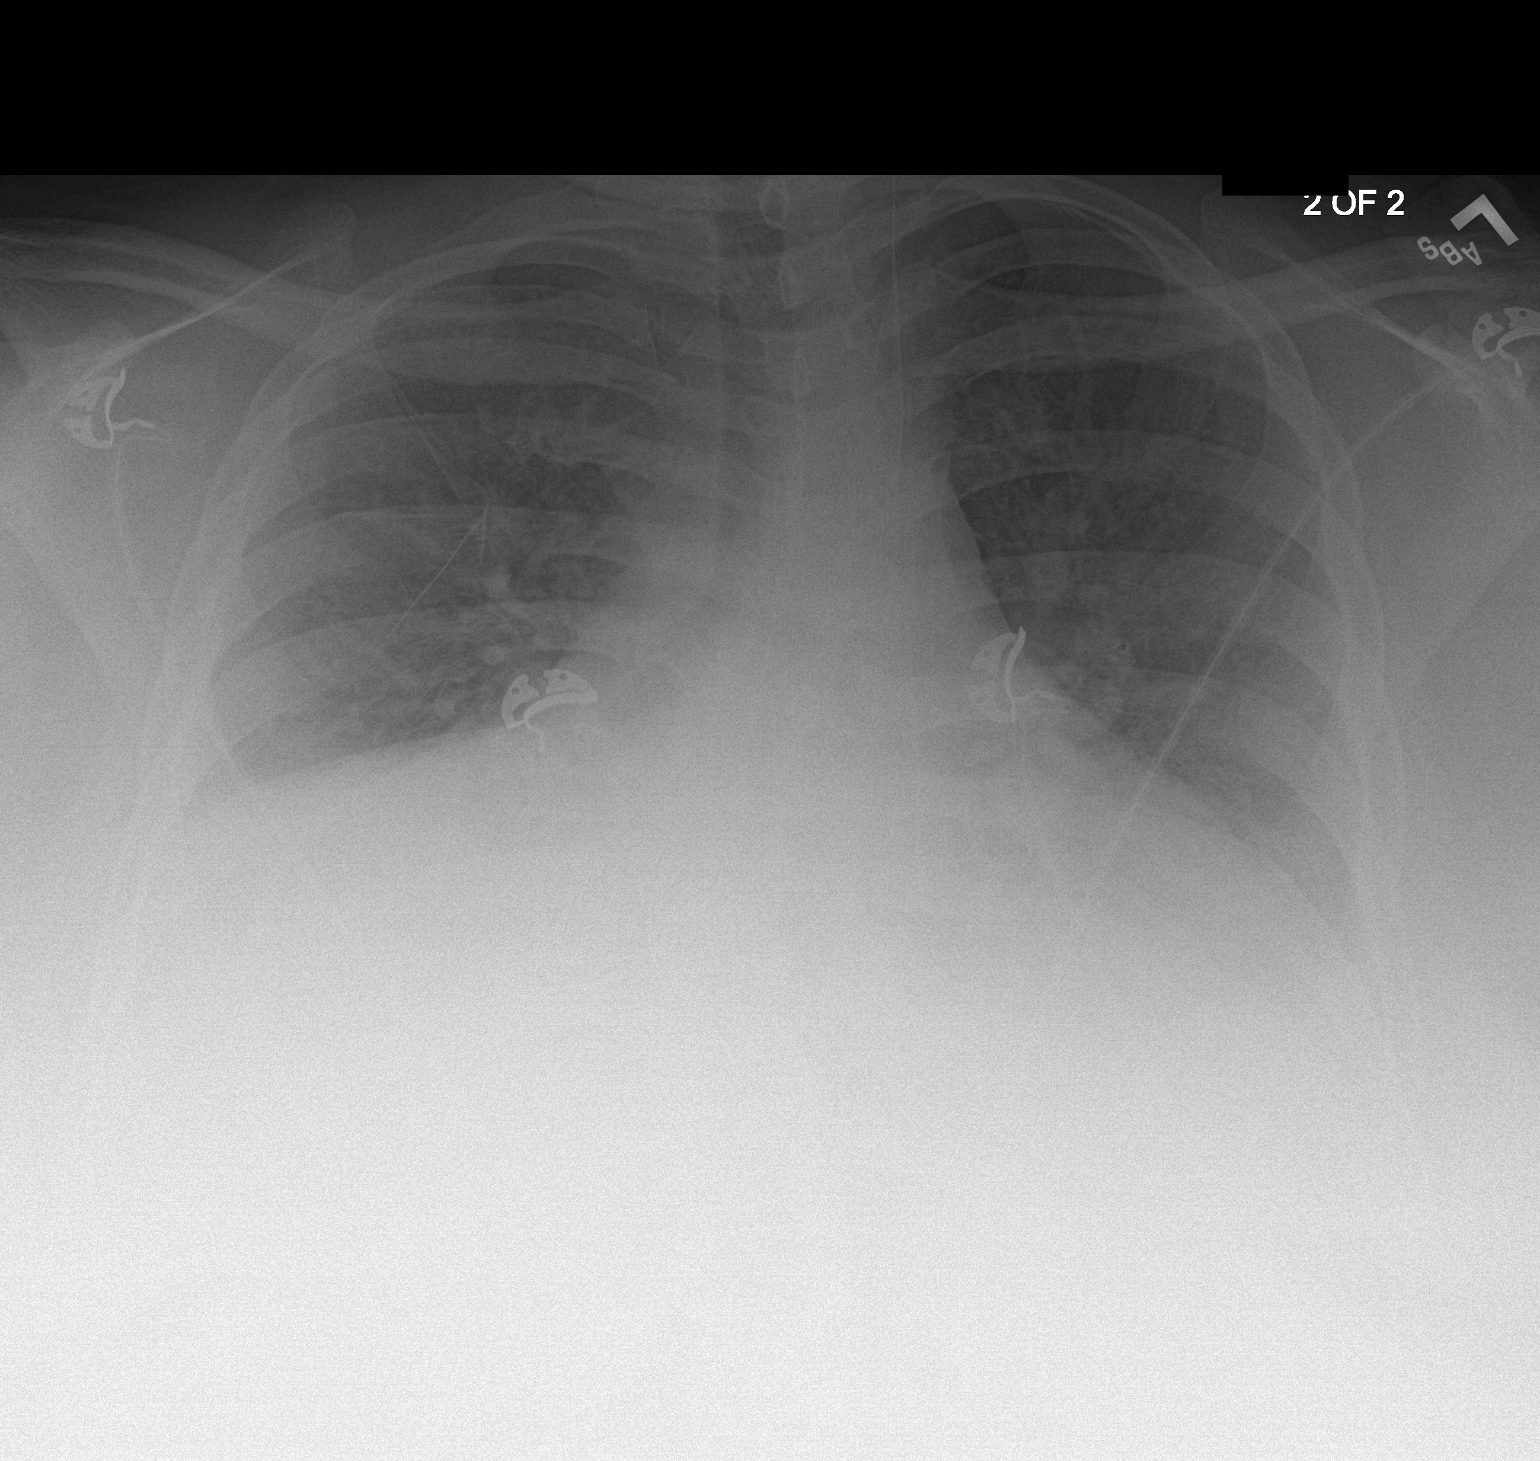

[2 of 2 positions shown; findings below may reference images not displayed]

FINDINGS: Cardiomegaly with pulmonary vascular prominence and bilateral
interstitial prominence with bilateral pleural effusions consistent
with CHF. Interim improvement from prior exam. No pneumothorax. No
acute bony abnormality.
IMPRESSION: Interim improvement congestive heart failure. Persistent pulmonary
interstitial edema and bilateral pleural effusions remain.

## 2016-06-25 MED ORDER — ACETAMINOPHEN 650 MG RE SUPP: 650.0000 mg | Freq: Four times a day (QID) | RECTAL | Status: DC | PRN

## 2016-06-25 MED ORDER — POLYETHYLENE GLYCOL 3350 17 G PO PACK: 17.0000 g | PACK | Freq: Every day | ORAL | Status: DC | PRN

## 2016-06-25 MED ORDER — HYDROCODONE-ACETAMINOPHEN 5-325 MG PO TABS
1.0000 | ORAL_TABLET | ORAL | Status: DC | PRN
  Administered 2016-06-25 – 2016-06-29 (×10): 2 via ORAL
  Filled 2016-06-25 (×10): qty 2

## 2016-06-25 MED ORDER — FUROSEMIDE 10 MG/ML IJ SOLN
60.0000 mg | Freq: Two times a day (BID) | INTRAMUSCULAR | Status: DC
  Administered 2016-06-26 – 2016-06-27 (×3): 60 mg via INTRAVENOUS
  Filled 2016-06-25 (×3): qty 6

## 2016-06-25 MED ORDER — DOCUSATE SODIUM 100 MG PO CAPS
100.0000 mg | ORAL_CAPSULE | Freq: Two times a day (BID) | ORAL | Status: DC
  Administered 2016-06-26 – 2016-06-29 (×6): 100 mg via ORAL
  Filled 2016-06-25 (×6): qty 1

## 2016-06-25 MED ORDER — CITALOPRAM HYDROBROMIDE 20 MG PO TABS
40.0000 mg | ORAL_TABLET | Freq: Every day | ORAL | Status: DC
  Administered 2016-06-25 – 2016-06-29 (×5): 40 mg via ORAL
  Filled 2016-06-25 (×5): qty 2

## 2016-06-25 MED ORDER — HYDRALAZINE HCL 20 MG/ML IJ SOLN: 10.0000 mg | Freq: Four times a day (QID) | INTRAMUSCULAR | Status: DC | PRN

## 2016-06-25 MED ORDER — POTASSIUM CHLORIDE CRYS ER 10 MEQ PO TBCR
10.0000 meq | EXTENDED_RELEASE_TABLET | Freq: Two times a day (BID) | ORAL | Status: DC
Start: 2016-06-25 — End: 2016-06-29
  Administered 2016-06-25 – 2016-06-29 (×8): 10 meq via ORAL
  Filled 2016-06-25 (×8): qty 1

## 2016-06-25 MED ORDER — SODIUM CHLORIDE 0.9% FLUSH
3.0000 mL | Freq: Two times a day (BID) | INTRAVENOUS | Status: DC
  Administered 2016-06-25 – 2016-06-29 (×9): 3 mL via INTRAVENOUS

## 2016-06-25 MED ORDER — AMLODIPINE BESYLATE 10 MG PO TABS
10.0000 mg | ORAL_TABLET | Freq: Every day | ORAL | Status: DC
  Administered 2016-06-25 – 2016-06-29 (×5): 10 mg via ORAL
  Filled 2016-06-25 (×5): qty 1

## 2016-06-25 MED ORDER — SIMVASTATIN 40 MG PO TABS
40.0000 mg | ORAL_TABLET | Freq: Every day | ORAL | Status: DC
  Administered 2016-06-25: 40 mg via ORAL
  Filled 2016-06-25: qty 1

## 2016-06-25 MED ORDER — INSULIN ASPART 100 UNIT/ML ~~LOC~~ SOLN
0.0000 [IU] | Freq: Every day | SUBCUTANEOUS | Status: DC
  Administered 2016-06-25 – 2016-06-26 (×2): 4 [IU] via SUBCUTANEOUS
  Administered 2016-06-27 – 2016-06-28 (×2): 3 [IU] via SUBCUTANEOUS
  Filled 2016-06-25: qty 4
  Filled 2016-06-25: qty 3
  Filled 2016-06-25: qty 4
  Filled 2016-06-25: qty 3

## 2016-06-25 MED ORDER — SODIUM CHLORIDE 0.9% FLUSH
3.0000 mL | INTRAVENOUS | Status: DC | PRN
  Administered 2016-06-26: 3 mL via INTRAVENOUS
  Filled 2016-06-25: qty 3

## 2016-06-25 MED ORDER — GUAIFENESIN-DM 100-10 MG/5ML PO SYRP
5.0000 mL | ORAL_SOLUTION | ORAL | Status: DC | PRN
  Administered 2016-06-25 – 2016-06-27 (×2): 5 mL via ORAL
  Filled 2016-06-25 (×2): qty 5

## 2016-06-25 MED ORDER — SODIUM CHLORIDE 0.9 % IV SOLN: 250.0000 mL | INTRAVENOUS | Status: DC | PRN

## 2016-06-25 MED ORDER — FUROSEMIDE 10 MG/ML IJ SOLN
80.0000 mg | INTRAMUSCULAR | Status: AC
  Administered 2016-06-25: 80 mg via INTRAVENOUS
  Filled 2016-06-25: qty 8

## 2016-06-25 MED ORDER — ONDANSETRON HCL 4 MG PO TABS
4.0000 mg | ORAL_TABLET | Freq: Four times a day (QID) | ORAL | Status: DC | PRN
  Filled 2016-06-25: qty 1

## 2016-06-25 MED ORDER — INSULIN GLARGINE 100 UNIT/ML ~~LOC~~ SOLN
45.0000 [IU] | Freq: Every day | SUBCUTANEOUS | Status: DC
  Administered 2016-06-25: 45 [IU] via SUBCUTANEOUS
  Filled 2016-06-25 (×2): qty 0.45

## 2016-06-25 MED ORDER — ASPIRIN EC 81 MG PO TBEC
81.0000 mg | DELAYED_RELEASE_TABLET | Freq: Every day | ORAL | Status: DC
  Administered 2016-06-26 – 2016-06-29 (×4): 81 mg via ORAL
  Filled 2016-06-25 (×4): qty 1

## 2016-06-25 MED ORDER — INSULIN DEGLUDEC 100 UNIT/ML ~~LOC~~ SOPN: 45.0000 [IU] | PEN_INJECTOR | Freq: Every day | SUBCUTANEOUS | Status: DC

## 2016-06-25 MED ORDER — ACETAMINOPHEN 325 MG PO TABS
650.0000 mg | ORAL_TABLET | Freq: Four times a day (QID) | ORAL | Status: DC | PRN
  Administered 2016-06-27: 650 mg via ORAL
  Filled 2016-06-25: qty 2

## 2016-06-25 MED ORDER — ALBUTEROL SULFATE (2.5 MG/3ML) 0.083% IN NEBU
2.5000 mg | INHALATION_SOLUTION | RESPIRATORY_TRACT | Status: DC | PRN
  Administered 2016-06-25 – 2016-06-28 (×12): 2.5 mg via RESPIRATORY_TRACT
  Filled 2016-06-25 (×12): qty 3

## 2016-06-25 MED ORDER — METOLAZONE 2.5 MG PO TABS
5.0000 mg | ORAL_TABLET | Freq: Every day | ORAL | Status: DC
  Administered 2016-06-25 – 2016-06-27 (×3): 5 mg via ORAL
  Filled 2016-06-25 (×3): qty 2

## 2016-06-25 MED ORDER — HYDRALAZINE HCL 50 MG PO TABS
100.0000 mg | ORAL_TABLET | Freq: Three times a day (TID) | ORAL | Status: DC
  Administered 2016-06-25 – 2016-06-29 (×12): 100 mg via ORAL
  Filled 2016-06-25 (×12): qty 2

## 2016-06-25 MED ORDER — AMLODIPINE BESYLATE 10 MG PO TABS: 10.0000 mg | ORAL_TABLET | Freq: Every day | ORAL | Status: DC

## 2016-06-25 MED ORDER — METHYLPREDNISOLONE SODIUM SUCC 125 MG IJ SOLR
60.0000 mg | Freq: Two times a day (BID) | INTRAMUSCULAR | Status: DC
  Administered 2016-06-25 – 2016-06-29 (×8): 60 mg via INTRAVENOUS
  Filled 2016-06-25 (×8): qty 2

## 2016-06-25 MED ORDER — INSULIN ASPART 100 UNIT/ML ~~LOC~~ SOLN
0.0000 [IU] | Freq: Three times a day (TID) | SUBCUTANEOUS | Status: DC
  Administered 2016-06-25: 8 [IU] via SUBCUTANEOUS
  Administered 2016-06-26: 15 [IU] via SUBCUTANEOUS
  Administered 2016-06-26: 8 [IU] via SUBCUTANEOUS
  Administered 2016-06-26 – 2016-06-27 (×2): 11 [IU] via SUBCUTANEOUS
  Administered 2016-06-27 (×2): 15 [IU] via SUBCUTANEOUS
  Administered 2016-06-28: 8 [IU] via SUBCUTANEOUS
  Administered 2016-06-28: 3 [IU] via SUBCUTANEOUS
  Administered 2016-06-28: 5 [IU] via SUBCUTANEOUS
  Administered 2016-06-29: 3 [IU] via SUBCUTANEOUS
  Administered 2016-06-29: 8 [IU] via SUBCUTANEOUS
  Filled 2016-06-25: qty 8
  Filled 2016-06-25: qty 11
  Filled 2016-06-25: qty 3
  Filled 2016-06-25: qty 5
  Filled 2016-06-25: qty 11
  Filled 2016-06-25: qty 8
  Filled 2016-06-25: qty 5
  Filled 2016-06-25 (×2): qty 3
  Filled 2016-06-25 (×2): qty 15
  Filled 2016-06-25: qty 8
  Filled 2016-06-25: qty 15

## 2016-06-25 MED ORDER — HEPARIN SODIUM (PORCINE) 5000 UNIT/ML IJ SOLN
5000.0000 [IU] | Freq: Three times a day (TID) | INTRAMUSCULAR | Status: DC
  Administered 2016-06-25 – 2016-06-29 (×11): 5000 [IU] via SUBCUTANEOUS
  Filled 2016-06-25 (×11): qty 1

## 2016-06-25 MED ORDER — ONDANSETRON HCL 4 MG/2ML IJ SOLN: 4.0000 mg | Freq: Four times a day (QID) | INTRAMUSCULAR | Status: DC | PRN

## 2016-06-25 MED ORDER — BUDESONIDE 0.25 MG/2ML IN SUSP
0.2500 mg | Freq: Two times a day (BID) | RESPIRATORY_TRACT | Status: DC
  Administered 2016-06-25 – 2016-06-28 (×6): 0.25 mg via RESPIRATORY_TRACT
  Filled 2016-06-25 (×6): qty 2

## 2016-06-25 MED ORDER — METOPROLOL TARTRATE 50 MG PO TABS
50.0000 mg | ORAL_TABLET | Freq: Two times a day (BID) | ORAL | Status: DC
  Administered 2016-06-25 – 2016-06-27 (×4): 50 mg via ORAL
  Filled 2016-06-25 (×4): qty 1

## 2016-06-25 NOTE — ED Provider Notes (Signed)
[facility] Emergency Department Provider Note  ____________________________________________   First MD Initiated Contact with Patient 06/25/16 1253     (approximate)  I have reviewed the triage vital signs and the nursing notes.   HISTORY  Chief Complaint Weakness   HPI ZAIRE VANBUSKIRK is a 46 y.o. female with a history of COPD as well as CHF was present in the emergency department todaywith several days of worsening shortness of breath, body aches as well as confusion and lower extremity swelling. Upon arrival to the emergency department today she was found to be 70% of her baseline 3 L of nasal cannula O2. She is also complaining of right knee pain after a fall this morning. She said that she fell down a step after tripping and scraped her right knee. She said that she was unable to get up and needed assistance by EMS. She is denying any chest pain. Says that she has been taking her torsemide as prescribed. Does not report any fever.   Past Medical History:  Diagnosis Date  . Asthma   . CHF (congestive heart failure) ([place])   . COPD (chronic obstructive pulmonary disease) ([place])   . Diabetes mellitus without complication ([place])   . Hypertension   . Iron deficiency anemia   . Pericardial effusion    a. 11/2014 CT Chest: Mod effusion.  [name] Poorly controlled diabetes mellitus ([place])    a. 11/2014 A1c 13.2.    Patient Active Problem List   Diagnosis Date Noted  . COPD (chronic obstructive pulmonary disease) ([place]) 03/25/2016  . Anxiety 10/02/2015  . Asthma 08/30/2015  . Poorly controlled type 2 diabetes mellitus ([place]) 03/13/2015  . Iron deficiency anemia 02/17/2015  . Morbid obesity due to excess calories ([place])   . Shortness of breath   . Essential hypertension   . CHF (congestive heart failure) ([place]) 01/01/2015  . Hypertensive heart disease with congestive heart failure ([place]) 12/18/2014  . Diabetes ([place]) 12/15/2014    Past Surgical History:    Procedure Laterality Date  . CESAREAN SECTION      Prior to Admission medications   Medication Sig Start Date End Date Taking? Authorizing Provider  acetaminophen (TYLENOL) 500 MG tablet Take 1,000 mg by mouth every 6 (six) hours as needed for mild pain, fever or headache.    Historical Provider, MD  albuterol (PROVENTIL HFA;VENTOLIN HFA) 108 (90 BASE) MCG/ACT inhaler Inhale 2 puffs into the lungs every 6 (six) hours as needed for wheezing or shortness of breath. 12/19/14   Dustin Flock, MD  amLODipine (NORVASC) 10 MG tablet Take 1 tablet (10 mg total) by mouth daily. 12/19/14   Dustin Flock, MD  aspirin EC 81 MG tablet Take 81 mg by mouth at bedtime.    Historical Provider, MD  budesonide (PULMICORT) 0.25 MG/2ML nebulizer solution Take 2 mLs (0.25 mg total) by nebulization 2 (two) times daily. 12/19/14   Dustin Flock, MD  citalopram (CELEXA) 40 MG tablet Take 40 mg by mouth daily.    Historical Provider, MD  guaiFENesin-dextromethorphan (ROBITUSSIN DM) 100-10 MG/5ML syrup Take 5 mLs by mouth every 4 (four) hours as needed for cough. 08/13/15   Demetrios Loll, MD  hydrALAZINE (APRESOLINE) 100 MG tablet Take 1 tablet (100 mg total) by mouth every 8 (eight) hours. 12/19/14   Dustin Flock, MD  insulin aspart (NOVOLOG FLEXPEN) 100 UNIT/ML FlexPen Inject 0-12 Units into the skin 3 (three) times daily with meals as needed for high blood sugar. Pt uses as needed  per sliding scale.    Historical Provider, MD  Insulin Degludec (TRESIBA FLEXTOUCH) 100 UNIT/ML SOPN Inject 45 Units into the skin at bedtime. 08/13/15   Demetrios Loll, MD  ipratropium-albuterol (DUONEB) 0.5-2.5 (3) MG/3ML SOLN Take 3 mLs by nebulization every 6 (six) hours as needed (for wheezing/shortness of breath).    Historical Provider, MD  losartan (COZAAR) 50 MG tablet Take 1 tablet (50 mg total) by mouth daily. 03/25/16   Alisa Graff, FNP  metFORMIN (GLUCOPHAGE) 500 MG tablet Take 500 mg by mouth 2 (two) times daily with a meal.     Historical Provider, MD  metolazone (ZAROXOLYN) 5 MG tablet Take 1 tablet (5 mg total) by mouth daily. 06/07/16 09/05/16  Alisa Graff, FNP  metoprolol (LOPRESSOR) 50 MG tablet Take 50 mg by mouth 2 (two) times daily.    Historical Provider, MD  potassium chloride (K-DUR,KLOR-CON) 10 MEQ tablet Take 1 tablet (10 mEq total) by mouth 2 (two) times daily. Can take an additional tablet when takes the metolazone 04/12/16   Alisa Graff, FNP  simvastatin (ZOCOR) 40 MG tablet Take 40 mg by mouth at bedtime.    Historical Provider, MD  torsemide (DEMADEX) 20 MG tablet Take 2 tablets (40 mg total) by mouth daily. 03/25/16   Alisa Graff, FNP    Allergies Feraheme [ferumoxytol] and Sulfa antibiotics  Family History  Problem Relation Age of Onset  . Diabetes Mother   . Hypertension Mother   . Hypertension Father   . Diabetes Father   . Hypertension    . Diabetes    . Breast cancer Maternal Aunt 17  . Breast cancer Maternal Aunt 50    Social History Social History  Substance Use Topics  . Smoking status: Never Smoker  . Smokeless tobacco: Never Used  . Alcohol use 0.0 oz/week     Comment: occ    Review of Systems  Constitutional: No fever/chills Eyes: No visual changes. ENT: No sore throat. Cardiovascular: Denies chest pain. Respiratory: as above Gastrointestinal: No abdominal pain.  No nausea, no vomiting.   Genitourinary: Negative for dysuria. Musculoskeletal: Negative for back pain. Skin: Negative for rash. Neurological: Negative for headaches, focal weakness or numbness.   ____________________________________________   PHYSICAL EXAM:  VITAL SIGNS: ED Triage Vitals  Enc Vitals Group     BP 06/25/16 1243 140/69     Pulse Rate 06/25/16 1243 98     Resp 06/25/16 1243 (!) 26     Temp 06/25/16 1302 99.2 F (37.3 C)     Temp Source 06/25/16 1302 Oral     SpO2 06/25/16 1243 (!) 70 %     Weight 06/25/16 1244 (!) 338 lb (153.3 kg)     Height 06/25/16 1244 5\' 4"  (1.626 m)      Head Circumference --      Peak Flow --      Pain Score 06/25/16 1242 9     Pain Loc --      Pain Edu? --      Excl. in [place]? --     Constitutional: Alert and oriented. Well appearing and in no acute distress. Eyes: Conjunctivae are normal. PERRL. EOMI. Head: Atraumatic. Nose: No congestion/rhinnorhea. Mouth/Throat: Mucous membranes are moist. Wearing the nonrebreather mask at this time. Neck: No stridor.   Cardiovascular: Normal rate, regular rhythm. Grossly normal heart sounds.   Respiratory: Normal respiratory effort.  No retractions. Rales to the bilateral bases. The patient is speaking in complete sentences. Gastrointestinal: Soft  and nontender. No distention.  Musculoskeletal: Moderate to severe bilateral lower extremity edema without any induration or tenderness palpation of bilateral lower external ease. Neurologic:  Normal speech and language. No gross focal neurologic deficits are appreciated.  Skin:  Skin is warm, dry and intact. No rash noted. Psychiatric: Mood and affect are normal. Speech and behavior are normal.  ____________________________________________   LABS (all labs ordered are listed, but only abnormal results are displayed)  Labs Reviewed  CBC WITH DIFFERENTIAL/PLATELET - Abnormal; Notable for the following:       Result Value   RBC 3.56 (*)    Hemoglobin 9.4 (*)    HCT 30.4 (*)    MCHC 31.0 (*)    RDW 16.2 (*)    Lymphs Abs 0.8 (*)    All other components within normal limits  BRAIN NATRIURETIC PEPTIDE - Abnormal; Notable for the following:    B Natriuretic Peptide 475.0 (*)    All other components within normal limits  BASIC METABOLIC PANEL - Abnormal; Notable for the following:    Chloride 93 (*)    CO2 38 (*)    Glucose, Bld 211 (*)    BUN 27 (*)    Creatinine, Ser 2.09 (*)    Calcium 8.2 (*)    GFR calc non Af Amer 28 (*)    GFR calc Af Amer 32 (*)    All other components within normal limits  TROPONIN I - Abnormal; Notable for the  following:    Troponin I 0.03 (*)    All other components within normal limits   ____________________________________________  EKG  ED ECG REPORT I, Doran Stabler, the attending physician, personally viewed and interpreted this ECG.   Date: 06/25/2016  EKG Time: 1255  Rate: 94  Rhythm: normal sinus rhythm  Axis: normal  Intervals:none  ST&T Change: No ST segment elevation or depression. No abnormal T-wave inversion.  ____________________________________________  DXAJOINOM  DG Chest 1 View (Final result)  Result time 06/25/16 13:37:50  Final result by Fabio Neighbors., MD (06/25/16 13:37:50)           Narrative:   CLINICAL DATA: Shortness of breath.  EXAM: CHEST 1 VIEW  COMPARISON: 03/23/2016.  FINDINGS: Cardiomegaly with pulmonary vascular prominence and bilateral interstitial prominence with bilateral pleural effusions consistent with CHF. Interim improvement from prior exam. No pneumothorax. No acute bony abnormality.  IMPRESSION: Interim improvement congestive heart failure. Persistent pulmonary interstitial edema and bilateral pleural effusions remain.   Electronically Signed By: Marcello Moores Register On: 06/25/2016 13:37            ____________________________________________   PROCEDURES  Procedure(s) performed:   Procedures  Critical Care performed:   ____________________________________________   INITIAL IMPRESSION / ASSESSMENT AND PLAN / ED COURSE  Pertinent labs & imaging results that were available during my care of the patient were reviewed by me and considered in my medical decision making (see chart for details).  ----------------------------------------- 2:20 PM on 06/25/2016 -----------------------------------------  Patient found to be in acute renal failure. Interim improvement congestive heart failure on her chest x-ray. However, the patient has persistent pulmonary interstitial edema and bilateral pleural  effusions. Combined with the patient's lower summary swelling she is likely overall fluid overloaded.  She'll be admitted to the hospital. We will attempt to wean her from the nonrebreather back to nasal cannula. Signed out to Dr. Darvin Neighbours of the medicine service. The patient is aware that she needs to be admitted to the hospital on his understanding  and willing to comply.      ____________________________________________   FINAL CLINICAL IMPRESSION(S) / ED DIAGNOSES  Hypoxia. Anasarca. Acute renal failure.    NEW MEDICATIONS STARTED DURING THIS VISIT:  New Prescriptions   No medications on file     Note:  This document was prepared using Dragon voice recognition software and may include unintentional dictation errors.    Orbie Pyo, MD 06/25/16 [phone]

## 2016-06-25 NOTE — ED Notes (Signed)
PT taken off NRB per MD order.

## 2016-06-25 NOTE — Plan of Care (Signed)
Problem: Education: Goal: Ability to demonstrate managment of disease process will improve Outcome: Progressing Teach back performed with patient at admission. Pt verbalized understanding and expressed no questions.

## 2016-06-25 NOTE — ED Triage Notes (Signed)
States SOB and not feeling herself for the past 3 days, states SOB and generalized pain, pt on 3L [place] at all times with hx of COPD and CHF

## 2016-06-25 NOTE — ED Notes (Signed)
PT able to urinate but had a BM as well. Urine sample contaminated. Will attempt to gain urine sample with next urination.

## 2016-06-25 NOTE — ED Notes (Signed)
Pt given two warm blankets and ice water to drink. OK per RN

## 2016-06-25 NOTE — H&P (Signed)
[facility] NAME: [name]    MR#:  [id number]  DATE OF BIRTH:  1970/09/27  DATE OF ADMISSION:  06/25/2016  PRIMARY CARE PHYSICIAN: [name], NP   REQUESTING/REFERRING PHYSICIAN: Dr. Dineen Kid  CHIEF COMPLAINT:   Chief Complaint  Patient presents with  . Weakness    HISTORY OF PRESENT ILLNESS:  [name]  is a 46 y.o. female with a known history of Diastolic congestive heart failure, COPD, chronic respiratory failure on 3-4 L oxygen, CKD stage III, morbid obesity, gait abnormalities presents to the emergency room with lower extremity edema, shortness of breath, cough, weakness and fall. Patient has chronic orthopnea. Has lost weight of 60 pounds over the last 6 months. But continues to have significant edema in her lower extremities. Has been following with congestive heart failure clinic. Dr. Ashby Dawes with pulmonary.  PAST MEDICAL HISTORY:   Past Medical History:  Diagnosis Date  . Asthma   . CHF (congestive heart failure) ([place])   . COPD (chronic obstructive pulmonary disease) ([place])   . Diabetes mellitus without complication ([place])   . Hypertension   . Iron deficiency anemia   . Pericardial effusion    a. 11/2014 CT Chest: Mod effusion.  [name] Poorly controlled diabetes mellitus ([place])    a. 11/2014 A1c 13.2.    PAST SURGICAL HISTORY:   Past Surgical History:  Procedure Laterality Date  . CESAREAN SECTION      SOCIAL HISTORY:   Social History  Substance Use Topics  . Smoking status: Never Smoker  . Smokeless tobacco: Never Used  . Alcohol use 0.0 oz/week     Comment: occ    FAMILY HISTORY:   Family History  Problem Relation Age of Onset  . Diabetes Mother   . Hypertension Mother   . Hypertension Father   . Diabetes Father   . Hypertension    . Diabetes    . Breast cancer Maternal Aunt 36  . Breast cancer Maternal Aunt 50    DRUG ALLERGIES:   Allergies  Allergen Reactions  .  Feraheme [Ferumoxytol] Itching  . Sulfa Antibiotics Itching    REVIEW OF SYSTEMS:   Review of Systems  Constitutional: Positive for malaise/fatigue. Negative for chills and fever.  HENT: Negative for sore throat.   Eyes: Negative for blurred vision, double vision and pain.  Respiratory: Positive for cough and shortness of breath. Negative for hemoptysis and wheezing.   Cardiovascular: Positive for orthopnea and leg swelling. Negative for chest pain and palpitations.  Gastrointestinal: Negative for abdominal pain, constipation, diarrhea, heartburn, nausea and vomiting.  Genitourinary: Negative for dysuria and hematuria.  Musculoskeletal: Positive for back pain and falls. Negative for joint pain.  Skin: Negative for rash.  Neurological: Positive for weakness. Negative for sensory change, speech change, focal weakness and headaches.  Endo/Heme/Allergies: Does not bruise/bleed easily.  Psychiatric/Behavioral: Negative for depression. The patient is not nervous/anxious.     MEDICATIONS AT HOME:   Prior to Admission medications   Medication Sig Start Date End Date Taking? Authorizing Provider  acetaminophen (TYLENOL) 500 MG tablet Take 1,000 mg by mouth every 6 (six) hours as needed for mild pain, fever or headache.   Yes Historical Provider, MD  albuterol (PROVENTIL HFA;VENTOLIN HFA) 108 (90 BASE) MCG/ACT inhaler Inhale 2 puffs into the lungs every 6 (six) hours as needed for wheezing or shortness of breath. 12/19/14  Yes Dustin Flock, MD  albuterol (PROVENTIL) (2.5 MG/3ML) 0.083% nebulizer solution Take  2.5 mg by nebulization every 6 (six) hours as needed for wheezing or shortness of breath.   Yes Historical Provider, MD  amLODipine (NORVASC) 10 MG tablet Take 1 tablet (10 mg total) by mouth daily. 12/19/14  Yes Dustin Flock, MD  aspirin EC 81 MG tablet Take 81 mg by mouth daily.    Yes Historical Provider, MD  citalopram (CELEXA) 40 MG tablet Take 40 mg by mouth daily.   Yes Historical  Provider, MD  hydrALAZINE (APRESOLINE) 100 MG tablet Take 1 tablet (100 mg total) by mouth every 8 (eight) hours. 12/19/14  Yes Dustin Flock, MD  insulin aspart (NOVOLOG FLEXPEN) 100 UNIT/ML FlexPen Inject 0-12 Units into the skin 3 (three) times daily with meals as needed for high blood sugar. Pt uses as needed per sliding scale.   Yes Historical Provider, MD  Insulin Degludec (TRESIBA FLEXTOUCH) 100 UNIT/ML SOPN Inject 45 Units into the skin at bedtime. 08/13/15  Yes Demetrios Loll, MD  losartan (COZAAR) 50 MG tablet Take 1 tablet (50 mg total) by mouth daily. 03/25/16  Yes Alisa Graff, FNP  metFORMIN (GLUCOPHAGE) 500 MG tablet Take 500 mg by mouth 2 (two) times daily with a meal.   Yes Historical Provider, MD  metolazone (ZAROXOLYN) 5 MG tablet Take 1 tablet (5 mg total) by mouth daily. 06/07/16 09/05/16 Yes Alisa Graff, FNP  metoprolol (LOPRESSOR) 50 MG tablet Take 50 mg by mouth 2 (two) times daily.   Yes Historical Provider, MD  potassium chloride (K-DUR,KLOR-CON) 10 MEQ tablet Take 1 tablet (10 mEq total) by mouth 2 (two) times daily. Can take an additional tablet when takes the metolazone 04/12/16  Yes Alisa Graff, FNP  simvastatin (ZOCOR) 40 MG tablet Take 40 mg by mouth at bedtime.   Yes Historical Provider, MD  torsemide (DEMADEX) 20 MG tablet Take 2 tablets (40 mg total) by mouth daily. 03/25/16  Yes Alisa Graff, FNP  budesonide (PULMICORT) 0.25 MG/2ML nebulizer solution Take 2 mLs (0.25 mg total) by nebulization 2 (two) times daily. Patient not taking: Reported on 06/25/2016 12/19/14   Dustin Flock, MD  guaiFENesin-dextromethorphan (ROBITUSSIN DM) 100-10 MG/5ML syrup Take 5 mLs by mouth every 4 (four) hours as needed for cough. Patient not taking: Reported on 06/25/2016 08/13/15   Demetrios Loll, MD     VITAL SIGNS:  Blood pressure (!) 187/98, pulse 89, temperature 99.2 F (37.3 C), temperature source Oral, resp. rate (!) 25, height 5\' 4"  (1.626 m), weight (!) 153.3 kg (338 lb), last  menstrual period 03/28/2016, SpO2 92 %.  PHYSICAL EXAMINATION:  Physical Exam  GENERAL:  46 y.o.-year-old patient lying in the bed , Conversational dyspnea. Morbidly obese EYES: Pupils equal, round, reactive to light and accommodation. No scleral icterus. Extraocular muscles intact.  HEENT: Head atraumatic, normocephalic. Oropharynx and nasopharynx clear. No oropharyngeal erythema, moist oral mucosa  NECK:  Supple, no jugular venous distention. No thyroid enlargement, no tenderness.  LUNGS: Bilateral wheezing with decreased air entry CARDIOVASCULAR: S1, S2 normal. No murmurs, rubs, or gallops.  ABDOMEN: Soft, nontender, nondistended. Bowel sounds present. No organomegaly or mass.  EXTREMITIES: No cyanosis, or clubbing. + 2 pedal & radial pulses b/l.  Bilateral lower extremity edema up to her thighs NEUROLOGIC: Cranial nerves II through XII are intact. No focal Motor or sensory deficits appreciated b/l PSYCHIATRIC: The patient is alert and oriented x 3. Good affect.  SKIN: No obvious rash, lesion, or ulcer.   LABORATORY PANEL:   CBC  Recent Labs Lab 06/25/16  1304  WBC 6.3  HGB 9.4*  HCT 30.4*  PLT 390   ------------------------------------------------------------------------------------------------------------------  Chemistries   Recent Labs Lab 06/25/16 1304  NA 137  K 3.8  CL 93*  CO2 38*  GLUCOSE 211*  BUN 27*  CREATININE 2.09*  CALCIUM 8.2*   ------------------------------------------------------------------------------------------------------------------  Cardiac Enzymes  Recent Labs Lab 06/25/16 1304  TROPONINI 0.03*   ------------------------------------------------------------------------------------------------------------------  RADIOLOGY:  Dg Chest 1 View  Result Date: 06/25/2016 CLINICAL DATA:  Shortness of breath. EXAM: CHEST 1 VIEW COMPARISON:  03/23/2016. FINDINGS: Cardiomegaly with pulmonary vascular prominence and bilateral interstitial  prominence with bilateral pleural effusions consistent with CHF. Interim improvement from prior exam. No pneumothorax. No acute bony abnormality. IMPRESSION: Interim improvement congestive heart failure. Persistent pulmonary interstitial edema and bilateral pleural effusions remain. Electronically Signed   By: Marcello Moores  Register   On: 06/25/2016 13:37   Dg Knee Complete 4 Views Right  Result Date: 06/25/2016 CLINICAL DATA:  46 year old female status post fall this morning down a step with right knee injury. Pain. Initial encounter. EXAM: RIGHT KNEE - COMPLETE 4+ VIEW COMPARISON:  None. FINDINGS: Evidence of widespread subcutaneous edema in the right lower extremity. Calcified peripheral vascular disease in the distal right femoral and popliteal artery is advanced for age. There is possibly a healed deformity of the right fibula proximal metadiaphysis, but no acute fracture or dislocation identified about the left knee. Possible small joint effusion. IMPRESSION: 1. No acute fracture or dislocation identified about the right knee. Possible small joint effusion. 2. Calcified peripheral vascular disease and possibly widespread soft tissue edema in the right lower extremity. Electronically Signed   By: Genevie Ann M.D.   On: 06/25/2016 14:29     IMPRESSION AND PLAN:   * Acute on chronic respiratory failure due to combination of congestive heart failure and COPD. Nebulizers when necessary. Wean oxygen as tolerated. Present on 5 L nasal cannula. Normally on 3-4 L at home. Will need BiPAP in stepdown transfer if any worsening.  * Acute on chronic diastolic congestive heart failure - IV Lasix, Beta blockers - Input and Output - Counseled to limit fluids and Salt - Monitor Bun/Cr and Potassium - Echo - reviewed. We will reorder -Cardiology follow up after discharge  * Acute COPD exacerbation. -IV steroids - Scheduled Nebulizers - Inhalers -Wean O2 as tolerated - Consult pulmonary if no improvement  *  History of present illness on CKD stage III. Likely cardiorenal syndrome. Continue Lasix. Consult nephrology. Monitor input and output. Daily weight.  * Insulin dependent diabetes mellitus. Continue patient's Levemir. Sliding scale insulin. Will likely be uncontrolled due to IV steroid use.  * DVT prophylaxis with heparin  All the records are reviewed and case discussed with ED provider. Management plans discussed with the patient, family and they are in agreement.  CODE STATUS: FULL CODE  TOTAL TIME TAKING CARE OF THIS PATIENT: 40 minutes.   Hillary Bow R M.D on 06/25/2016 at 2:49 PM  Between 7am to 6pm - Pager - [phone]  After 6pm go to [email or web address] - password [email or web address] [facility]  Office  [phone]  CC: Primary care physician; [name], NP  Note: This dictation was prepared with Dragon dictation along with smaller phrase technology. Any transcriptional errors that result from this process are unintentional.

## 2016-06-25 NOTE — ED Notes (Signed)
Pt has shoes and clothing in belongings bag. Bag taken to unit with pt. Pt holding cell phone throughout transfer.

## 2016-06-26 ENCOUNTER — Inpatient Hospital Stay: Payer: Medicaid Other

## 2016-06-26 DIAGNOSIS — J9620 Acute and chronic respiratory failure, unspecified whether with hypoxia or hypercapnia: Secondary | ICD-10-CM

## 2016-06-26 DIAGNOSIS — N183 Chronic kidney disease, stage 3 unspecified: Secondary | ICD-10-CM

## 2016-06-26 DIAGNOSIS — J9621 Acute and chronic respiratory failure with hypoxia: Secondary | ICD-10-CM

## 2016-06-26 DIAGNOSIS — I5033 Acute on chronic diastolic (congestive) heart failure: Secondary | ICD-10-CM

## 2016-06-26 DIAGNOSIS — E1165 Type 2 diabetes mellitus with hyperglycemia: Secondary | ICD-10-CM

## 2016-06-26 DIAGNOSIS — I110 Hypertensive heart disease with heart failure: Secondary | ICD-10-CM

## 2016-06-26 LAB — PROTEIN / CREATININE RATIO, URINE
Creatinine, Urine: 67 mg/dL
PROTEIN CREATININE RATIO: 5.43 mg/mg{creat} — AB (ref 0.00–0.15)
Total Protein, Urine: 364 mg/dL

## 2016-06-26 LAB — URINALYSIS, COMPLETE (UACMP) WITH MICROSCOPIC
BILIRUBIN URINE: NEGATIVE
Bacteria, UA: NONE SEEN
Glucose, UA: >=500 mg/dL — AB
Hgb urine dipstick: NEGATIVE
Ketones, ur: NEGATIVE mg/dL
Leukocytes, UA: NEGATIVE
Nitrite: NEGATIVE
PH: 6 (ref 5.0–8.0)
Protein, ur: 100 mg/dL — AB
SPECIFIC GRAVITY, URINE: 1.01 (ref 1.005–1.030)

## 2016-06-26 LAB — BASIC METABOLIC PANEL
Anion gap: 5 (ref 5–15)
BUN: 32 mg/dL — AB (ref 6–20)
CALCIUM: 8.2 mg/dL — AB (ref 8.9–10.3)
CO2: 39 mmol/L — AB (ref 22–32)
CREATININE: 1.95 mg/dL — AB (ref 0.44–1.00)
Chloride: 90 mmol/L — ABNORMAL LOW (ref 101–111)
GFR calc non Af Amer: 30 mL/min — ABNORMAL LOW (ref >60)
GFR, EST AFRICAN AMERICAN: 35 mL/min — AB (ref >60)
Glucose, Bld: 362 mg/dL — ABNORMAL HIGH (ref 65–99)
Potassium: 4.1 mmol/L (ref 3.5–5.1)
SODIUM: 134 mmol/L — AB (ref 135–145)

## 2016-06-26 LAB — CBC
HCT: 31.9 % — ABNORMAL LOW (ref 35.0–47.0)
Hemoglobin: 10.4 g/dL — ABNORMAL LOW (ref 12.0–16.0)
MCH: 28 pg (ref 26.0–34.0)
MCHC: 32.5 g/dL (ref 32.0–36.0)
MCV: 86.1 fL (ref 80.0–100.0)
PLATELETS: 387 10*3/uL (ref 150–440)
RBC: 3.7 MIL/uL — AB (ref 3.80–5.20)
RDW: 15.8 % — AB (ref 11.5–14.5)
WBC: 6.9 10*3/uL (ref 3.6–11.0)

## 2016-06-26 LAB — PHOSPHORUS: PHOSPHORUS: 4 mg/dL (ref 2.5–4.6)

## 2016-06-26 LAB — GLUCOSE, CAPILLARY
GLUCOSE-CAPILLARY: 281 mg/dL — AB (ref 65–99)
GLUCOSE-CAPILLARY: 386 mg/dL — AB (ref 65–99)
Glucose-Capillary: 328 mg/dL — ABNORMAL HIGH (ref 65–99)
Glucose-Capillary: 344 mg/dL — ABNORMAL HIGH (ref 65–99)

## 2016-06-26 LAB — TSH: TSH: 1.019 u[IU]/mL (ref 0.350–4.500)

## 2016-06-26 LAB — TROPONIN I
Troponin I: 0.03 ng/mL (ref <0.03)
Troponin I: 0.03 ng/mL (ref <0.03)

## 2016-06-26 MED ORDER — INSULIN ASPART 100 UNIT/ML ~~LOC~~ SOLN
8.0000 [IU] | Freq: Three times a day (TID) | SUBCUTANEOUS | Status: DC
  Administered 2016-06-26: 8 [IU] via SUBCUTANEOUS
  Filled 2016-06-26: qty 8

## 2016-06-26 MED ORDER — ATORVASTATIN CALCIUM 20 MG PO TABS
20.0000 mg | ORAL_TABLET | Freq: Every day | ORAL | Status: DC
  Administered 2016-06-26 – 2016-06-28 (×3): 20 mg via ORAL
  Filled 2016-06-26 (×3): qty 1

## 2016-06-26 MED ORDER — INSULIN GLARGINE 100 UNIT/ML ~~LOC~~ SOLN
50.0000 [IU] | Freq: Every day | SUBCUTANEOUS | Status: DC
  Administered 2016-06-26 – 2016-06-28 (×3): 50 [IU] via SUBCUTANEOUS
  Filled 2016-06-26 (×4): qty 0.5

## 2016-06-26 NOTE — Care Management Note (Signed)
Case Management Note  Patient Details  Name: [name] MRN: [id number] Date of Birth: 03-18-1970  Subjective/Objective:                 Admitted from home with exac of copd and CHF.  Followed by heart failure clinic. Medicaid PCS services 4 days a week 3 hours a day.  Chronic home oxygen through [facility].  Has had [facility] home health nurse in the past and would accept it again if needed.  Denies issues accessing medical care, transportation or obtaining medications.  Action/Plan:  Heads up referral to [facility] for home health nurse   Expected Discharge Date:                  Expected Discharge Plan:  [place]  In-House Referral:     Discharge planning Services  CM Consult, HF Clinic  Post Acute Care Choice:    Choice offered to:     DME Arranged:    DME Agency:     HH Arranged:  RN Grandfather Agency:  [facility]  Status of Service:  In process, will continue to follow  If discussed at Long Length of Stay Meetings, dates discussed:    Additional Comments:  Katrina Stack, RN 06/26/2016, 3:08 PM

## 2016-06-26 NOTE — Progress Notes (Signed)
Inpatient Diabetes Program Recommendations  AACE/ADA: New Consensus Statement on Inpatient Glycemic Control (2015)  Target Ranges:  Prepandial:   less than 140 mg/dL      Peak postprandial:   less than 180 mg/dL (1-2 hours)      Critically ill patients:  140 - 180 mg/dL   Results for [name], [name] (MRN [id number]) as of 06/26/2016 09:21  Ref. Range 06/25/2016 18:10 06/25/2016 20:38 06/26/2016 07:22  Glucose-Capillary Latest Ref Range: 65 - 99 mg/dL 287 (H) 336 (H) 328 (H)   Review of Glycemic Control  Diabetes history: DM2 Outpatient Diabetes medications: Tresiba 45 units QHS, Novolog 0-12 units TID with meals, Metformin 500 mg BID Current orders for Inpatient glycemic control: Lantus 45 units QHS, Novolog 0-15 units TID with meals, Novolog 0-5 units QHS  Inpatient Diabetes Program Recommendations: Insulin - Basal: If steroids are continued as ordered, please consider increasing Lantus to 53 units QHS (based on 151 kg x 0.35 units). Insulin - Meal Coverage: Please consider ordering Novolog 8 units TID with meals for meal coverage if patient eats at least 50% of meals. HgbA1C: A1C in process.  NOTE: In reviewing chart, noted patient had initial consult for Dr. Gabriel Carina for DM management on 11/08/15 with instructions to follow up in 4 weeks but no other office visits with Dr. Gabriel Carina noted in the chart.   Thanks, Barnie Alderman, RN, MSN, CDE Diabetes Coordinator Inpatient Diabetes Program [phone] (Team Pager from 8am to 5pm)

## 2016-06-26 NOTE — Consult Note (Signed)
Cardiology Consultation Note  Patient ID: [name], MRN: [id number], DOB/AGE: Apr 18, 1970 46 y.o. Admit date: 06/25/2016   Date of Consult: 06/26/2016 Primary Physician: [name], NP Primary Cardiologist: Dr. Rockey Situ, MD Requesting Physician: Dr. Juleen China, MD  Chief Complaint: Weakness Reason for Consult: Acute on chronic diastolic CHF in the setting of noncompliance   HPI: [name] is a 46 y.o. female who is being seen today for the evaluation of acute on chronic diastolic CHF at the request of Dr. Juleen China, MD. Patient has a h/o chronic diastolic CHF, chronic respiratory failure on home oxygen at 3-4 L secondary to COPD, CKD stage III, anemia in the setting of heavy menses requiring iron infusions, asthma, morbid obesity, and medication noncompliance who presented to [facility] on 5/1 with weakness and was found to have acute on chronic respiratory failure 2/2 AECOPD and acute on chronic diastolic CHF 2/2 medication noncompliance.  Most recent echo from 11/2014 with EF 50-55%, normal wall motion, GR1DD, LA mildly dilated, small to moderate pericardial effusion without evidence of hemodynamic compromise.  Last seen in clinic on 12/25/2015 with a weight of 351 pounds. Was only taking torsemide 3 x per week. Felt ok at that time.   Seen in the ED in late January 2018 for HF exacerbation. Most recently seen by [facility]F Clinic with a weight of 338 pounds. She was somewhat SOB at that time. Also coughing up thick green sputum. At that time, she was advised to take torsemide daily with the addition of metolazone 5 mg x 3 days.   Presented to [facility] on 5/1 with a several day history of increased LE swelling and SOB. She reports the "real reason I came in was because my legs were sore." She has has been on her baseline 3-4 L of oxygen at home. She has stable 2-pillow orthopnea. She denies eating excess salt. No chest pain or palpitations. No early satiety. She reports her weight at home on 5/1 was 338  pounds.   Patient reports she has been taking torsemide 2-3 x per week as she is "afraid of her kidneys." She also reports taking prn metolazone 2-3 x per week as well since she was given this Rx on 06/07/16.   Upon her arrival she was noted to have a BP of 140/69, HR 98 bpm, oxygen saturation of 70% on room air requiring NRB, weight 338 pounds. Labs showed wbc 6.3, hgb 9.4, plt 390, SCr 2.09-->1.95, K+ 3.8-->4.1, troponin 0.03 not trended, BNP 475. EKG as below, not acute. CXR showed improvement of her CHF with persistent pulmonary interstitial edema and bilateral pleural effusions. She was admitted by IM and placed on IV Lasix 60 mg bid with a UOP of 860 mL to date. Weight this morning of 333 pounds.    Past Medical History:  Diagnosis Date  . Asthma   . CHF (congestive heart failure) ([place])   . COPD (chronic obstructive pulmonary disease) ([place])   . Diabetes mellitus without complication ([place])   . Hypertension   . Iron deficiency anemia   . Pericardial effusion    a. 11/2014 CT Chest: Mod effusion.  [name] Poorly controlled diabetes mellitus ([place])    a. 11/2014 A1c 13.2.      Most Recent Cardiac Studies: As above   Surgical History:  Past Surgical History:  Procedure Laterality Date  . CESAREAN SECTION       Home Meds: Prior to Admission medications   Medication Sig Start Date End Date Taking? Authorizing  Provider  acetaminophen (TYLENOL) 500 MG tablet Take 1,000 mg by mouth every 6 (six) hours as needed for mild pain, fever or headache.   Yes Historical Provider, MD  albuterol (PROVENTIL HFA;VENTOLIN HFA) 108 (90 BASE) MCG/ACT inhaler Inhale 2 puffs into the lungs every 6 (six) hours as needed for wheezing or shortness of breath. 12/19/14  Yes Dustin Flock, MD  albuterol (PROVENTIL) (2.5 MG/3ML) 0.083% nebulizer solution Take 2.5 mg by nebulization every 6 (six) hours as needed for wheezing or shortness of breath.   Yes Historical Provider, MD  amLODipine (NORVASC) 10 MG tablet Take  1 tablet (10 mg total) by mouth daily. 12/19/14  Yes Dustin Flock, MD  aspirin EC 81 MG tablet Take 81 mg by mouth daily.    Yes Historical Provider, MD  citalopram (CELEXA) 40 MG tablet Take 40 mg by mouth daily.   Yes Historical Provider, MD  hydrALAZINE (APRESOLINE) 100 MG tablet Take 1 tablet (100 mg total) by mouth every 8 (eight) hours. 12/19/14  Yes Dustin Flock, MD  insulin aspart (NOVOLOG FLEXPEN) 100 UNIT/ML FlexPen Inject 0-12 Units into the skin 3 (three) times daily with meals as needed for high blood sugar. Pt uses as needed per sliding scale.   Yes Historical Provider, MD  Insulin Degludec (TRESIBA FLEXTOUCH) 100 UNIT/ML SOPN Inject 45 Units into the skin at bedtime. 08/13/15  Yes Demetrios Loll, MD  losartan (COZAAR) 50 MG tablet Take 1 tablet (50 mg total) by mouth daily. 03/25/16  Yes Alisa Graff, FNP  metFORMIN (GLUCOPHAGE) 500 MG tablet Take 500 mg by mouth 2 (two) times daily with a meal.   Yes Historical Provider, MD  metolazone (ZAROXOLYN) 5 MG tablet Take 1 tablet (5 mg total) by mouth daily. 06/07/16 09/05/16 Yes Alisa Graff, FNP  metoprolol (LOPRESSOR) 50 MG tablet Take 50 mg by mouth 2 (two) times daily.   Yes Historical Provider, MD  potassium chloride (K-DUR,KLOR-CON) 10 MEQ tablet Take 1 tablet (10 mEq total) by mouth 2 (two) times daily. Can take an additional tablet when takes the metolazone 04/12/16  Yes Alisa Graff, FNP  simvastatin (ZOCOR) 40 MG tablet Take 40 mg by mouth at bedtime.   Yes Historical Provider, MD  torsemide (DEMADEX) 20 MG tablet Take 2 tablets (40 mg total) by mouth daily. 03/25/16  Yes Alisa Graff, FNP  budesonide (PULMICORT) 0.25 MG/2ML nebulizer solution Take 2 mLs (0.25 mg total) by nebulization 2 (two) times daily. Patient not taking: Reported on 06/25/2016 12/19/14   Dustin Flock, MD  guaiFENesin-dextromethorphan (ROBITUSSIN DM) 100-10 MG/5ML syrup Take 5 mLs by mouth every 4 (four) hours as needed for cough. Patient not taking: Reported  on 06/25/2016 08/13/15   Demetrios Loll, MD    Inpatient Medications:  . amLODipine  10 mg Oral Daily  . aspirin EC  81 mg Oral Daily  . atorvastatin  20 mg Oral q1800  . budesonide  0.25 mg Nebulization BID  . citalopram  40 mg Oral Daily  . docusate sodium  100 mg Oral BID  . furosemide  60 mg Intravenous BID  . heparin  5,000 Units Subcutaneous Q8H  . hydrALAZINE  100 mg Oral Q8H  . insulin aspart  0-15 Units Subcutaneous TID WC  . insulin aspart  0-5 Units Subcutaneous QHS  . insulin glargine  45 Units Subcutaneous QHS  . methylPREDNISolone (SOLU-MEDROL) injection  60 mg Intravenous BID  . metolazone  5 mg Oral Daily  . metoprolol  50 mg Oral  BID  . potassium chloride  10 mEq Oral BID  . sodium chloride flush  3 mL Intravenous Q12H   . sodium chloride      Allergies:  Allergies  Allergen Reactions  . Feraheme [Ferumoxytol] Itching  . Sulfa Antibiotics Itching    Social History   Social History  . Marital status: Married    Spouse name: N/A  . Number of children: N/A  . Years of education: N/A   Occupational History  . Not on file.   Social History Main Topics  . Smoking status: Never Smoker  . Smokeless tobacco: Never Used  . Alcohol use 0.0 oz/week     Comment: occ  . Drug use: No  . Sexual activity: Yes    Birth control/ protection: None   Other Topics Concern  . Not on file   Social History Narrative  . No narrative on file     Family History  Problem Relation Age of Onset  . Diabetes Mother   . Hypertension Mother   . Hypertension Father   . Diabetes Father   . Hypertension    . Diabetes    . Breast cancer Maternal Aunt 82  . Breast cancer Maternal Aunt 50     Review of Systems: Review of Systems  Constitutional: Positive for malaise/fatigue. Negative for chills, diaphoresis, fever and weight loss.  HENT: Negative for congestion.   Eyes: Negative for discharge and redness.  Respiratory: Positive for shortness of breath and wheezing. Negative  for cough, hemoptysis and sputum production.   Cardiovascular: Positive for orthopnea and leg swelling. Negative for chest pain, palpitations, claudication and PND.  Gastrointestinal: Negative for abdominal pain, blood in stool, heartburn, melena, nausea and vomiting.  Genitourinary: Negative for hematuria.  Musculoskeletal: Positive for myalgias. Negative for falls.  Skin: Negative for rash.  Neurological: Positive for weakness. Negative for dizziness, tingling, tremors, sensory change, speech change, focal weakness and loss of consciousness.  Endo/Heme/Allergies: Does not bruise/bleed easily.  Psychiatric/Behavioral: Negative for substance abuse. The patient is not nervous/anxious.   All other systems reviewed and are negative.   Labs:  Recent Labs  06/25/16 1304  TROPONINI 0.03*   Lab Results  Component Value Date   WBC 6.9 06/26/2016   HGB 10.4 (L) 06/26/2016   HCT 31.9 (L) 06/26/2016   MCV 86.1 06/26/2016   PLT 387 06/26/2016     Recent Labs Lab 06/26/16 0358  NA 134*  K 4.1  CL 90*  CO2 39*  BUN 32*  CREATININE 1.95*  CALCIUM 8.2*  GLUCOSE 362*   Lab Results  Component Value Date   CHOL 232 (H) 01/03/2015   HDL 41 01/03/2015   LDLCALC 169 (H) 01/03/2015   TRIG 112 01/03/2015   No results found for: DDIMER  Radiology/Studies:  Dg Chest 1 View  Result Date: 06/25/2016 CLINICAL DATA:  Shortness of breath. EXAM: CHEST 1 VIEW COMPARISON:  03/23/2016. FINDINGS: Cardiomegaly with pulmonary vascular prominence and bilateral interstitial prominence with bilateral pleural effusions consistent with CHF. Interim improvement from prior exam. No pneumothorax. No acute bony abnormality. IMPRESSION: Interim improvement congestive heart failure. Persistent pulmonary interstitial edema and bilateral pleural effusions remain. Electronically Signed   By: Marcello Moores  Register   On: 06/25/2016 13:37   Dg Knee Complete 4 Views Right  Result Date: 06/25/2016 CLINICAL DATA:   46 year old female status post fall this morning down a step with right knee injury. Pain. Initial encounter. EXAM: RIGHT KNEE - COMPLETE 4+ VIEW COMPARISON:  None. FINDINGS:  Evidence of widespread subcutaneous edema in the right lower extremity. Calcified peripheral vascular disease in the distal right femoral and popliteal artery is advanced for age. There is possibly a healed deformity of the right fibula proximal metadiaphysis, but no acute fracture or dislocation identified about the left knee. Possible small joint effusion. IMPRESSION: 1. No acute fracture or dislocation identified about the right knee. Possible small joint effusion. 2. Calcified peripheral vascular disease and possibly widespread soft tissue edema in the right lower extremity. Electronically Signed   By: Genevie Ann M.D.   On: 06/25/2016 14:29    EKG: Interpreted by me showed: NSR, 94 bpm, nonspecific st/t changes Telemetry: Interpreted by me showed: sinus rhythm   Weights: Filed Weights   06/25/16 1244 06/25/16 1916 06/26/16 0716  Weight: (!) 338 lb (153.3 kg) (!) 342 lb 12.8 oz (155.5 kg) (!) 333 lb 8 oz (151.3 kg)     Physical Exam: Blood pressure (!) 148/68, pulse 85, temperature 98.2 F (36.8 C), temperature source Oral, resp. rate 20, height 5\' 4"  (1.626 m), weight (!) 333 lb 8 oz (151.3 kg), last menstrual period 03/28/2016, SpO2 96 %. Body mass index is 57.25 kg/m. General: Well developed, well nourished, in no acute distress. Head: Normocephalic, atraumatic, sclera non-icteric, no xanthomas, nares are without discharge.  Neck: Negative for carotid bruits. JVD difficult to assess 2/2 body habitus. Lungs: Diminished breath sounds bilaterally with bilateral wheezing and bibasilar crackles. Breathing is unlabored on supplemental oxygen at 4 L via nasal cannula.  Heart: RRR with S1 S2. No murmurs, rubs, or gallops appreciated. Abdomen: Obese, soft, non-tender, non-distended with normoactive bowel sounds. No hepatomegaly.  No rebound/guarding. No obvious abdominal masses. Msk:  Strength and tone appear normal for age. Extremities: No clubbing or cyanosis. 1+ pitting LE edema bilaterally. Distal pedal pulses are 2+ and equal bilaterally. Neuro: Alert and oriented X 3. No facial asymmetry. No focal deficit. Moves all extremities spontaneously. Psych:  Responds to questions appropriately with a normal affect.    Assessment and Plan:  Active Problems:   Acute on chronic respiratory failure (HCC)   Hypertensive heart disease with congestive heart failure (HCC)   Acute on chronic diastolic CHF (congestive heart failure) (HCC)   Morbid obesity (HCC)   Iron deficiency anemia   Poorly controlled type 2 diabetes mellitus (HCC)   Asthma   COPD exacerbation (HCC)   CKD (chronic kidney disease), stage III    1. Acute on chronic respiratory failure with hypoxia: -Likely multifactorial including COPD exacerbation, acute on chronic diastolic CHF 2/2 medication and dietary noncompliance, iron deficiency anemia, morbid obesity with likely OHS -Wean oxygen to baseline as able per IM  2. Acute on chronic diastolic CHF: -Likely in the setting of medication noncompliance -She reports minimal UOP with IV Lasix thus far, though weight is down 5 pounds today -Continue with IV Lasix and short course of metolazone with daily checks each morning to assess if another dose is needed prior to administration with KCl repletion and close monitoring of renal function -Some of her LE swelling is likely 2/2 amlodipine, consider alternative antihypertensive  -Renal on board to assist with diuresis given her renal disease -TTE pending  3. Hypertensive heart disease: -BP improving -Continue home medications  4. Mildly elevated troponin: -Continue to trend x 3 to rule out -Likely supply demand ischemia in the setting of the above -Echo pending -No heparin gtt at this time  5. AECOPD: -Per IM  6. Anemia: -Per IM  7. Morbid  obesity/possible OHS: -Weight loss advised  8. CKD stage III: -Renal on board -Renal function improving with diuresis     Signed, Christell Faith, PA-C [facility] Pager: [phone] 06/26/2016, 11:21 AM

## 2016-06-26 NOTE — Consult Note (Signed)
[facility]  CONSULT NOTE    Date: 06/26/2016                  Patient Name:  [name]  MRN: [id number]  DOB: May 16, 1970  Age / Sex: 46 y.o., female         PCP: [name], NP                 Service Requesting Consult: Dr. Darvin Neighbours                 Reason for Consult: Acute renal failure on chronic kidney disease stage III            History of Present Illness: [name] is a 46 y.o. black female with congestive heart failure, COPD/asthma, hypertension, diabetes mellitus type II, anemia, anxeity, who was admitted to [facility] on 06/25/2016 for Anasarca [R60.1] Hypoxia [R09.02] Acute renal failure, unspecified acute renal failure type ([place]) [N17.9]   Patient has not been taking her furosemide as prescribed because she was concerned that this was making her kidney function worse.   Patient fell and states that she is not ambulating currently.   She states her blood pressure is not at goal at home.   She states her blood glucose fluctuates.     Medications: Outpatient medications: Prescriptions Prior to Admission  Medication Sig Dispense Refill Last Dose  . acetaminophen (TYLENOL) 500 MG tablet Take 1,000 mg by mouth every 6 (six) hours as needed for mild pain, fever or headache.   prn at prn  . albuterol (PROVENTIL HFA;VENTOLIN HFA) 108 (90 BASE) MCG/ACT inhaler Inhale 2 puffs into the lungs every 6 (six) hours as needed for wheezing or shortness of breath. 1 Inhaler 0 prn at prn  . albuterol (PROVENTIL) (2.5 MG/3ML) 0.083% nebulizer solution Take 2.5 mg by nebulization every 6 (six) hours as needed for wheezing or shortness of breath.   prn at prn  . amLODipine (NORVASC) 10 MG tablet Take 1 tablet (10 mg total) by mouth daily. 30 tablet 0 06/25/2016 at am  . aspirin EC 81 MG tablet Take 81 mg by mouth daily.    06/25/2016 at am  . citalopram (CELEXA) 40 MG tablet Take 40 mg by mouth daily.   Past Month at Unknown time  . hydrALAZINE  (APRESOLINE) 100 MG tablet Take 1 tablet (100 mg total) by mouth every 8 (eight) hours. 60 tablet 0 06/25/2016 at am  . insulin aspart (NOVOLOG FLEXPEN) 100 UNIT/ML FlexPen Inject 0-12 Units into the skin 3 (three) times daily with meals as needed for high blood sugar. Pt uses as needed per sliding scale.   prn at prn  . Insulin Degludec (TRESIBA FLEXTOUCH) 100 UNIT/ML SOPN Inject 45 Units into the skin at bedtime. 1 pen 2 06/21/2016 at qhs  . losartan (COZAAR) 50 MG tablet Take 1 tablet (50 mg total) by mouth daily. 30 tablet 5 06/25/2016 at am  . metFORMIN (GLUCOPHAGE) 500 MG tablet Take 500 mg by mouth 2 (two) times daily with a meal.   06/25/2016 at am  . metolazone (ZAROXOLYN) 5 MG tablet Take 1 tablet (5 mg total) by mouth daily. 20 tablet 0   . metoprolol (LOPRESSOR) 50 MG tablet Take 50 mg by mouth 2 (two) times daily.   06/25/2016 at am  . potassium chloride (K-DUR,KLOR-CON) 10 MEQ tablet Take 1 tablet (10 mEq total) by mouth 2 (two) times daily. Can take an additional tablet when  takes the metolazone 80 tablet 5 06/25/2016 at am  . simvastatin (ZOCOR) 40 MG tablet Take 40 mg by mouth at bedtime.   06/24/2016 at qhs  . torsemide (DEMADEX) 20 MG tablet Take 2 tablets (40 mg total) by mouth daily. 60 tablet 5 06/24/2016 at am  . budesonide (PULMICORT) 0.25 MG/2ML nebulizer solution Take 2 mLs (0.25 mg total) by nebulization 2 (two) times daily. (Patient not taking: Reported on 06/25/2016) 60 mL 12 Not Taking at Unknown time  . guaiFENesin-dextromethorphan (ROBITUSSIN DM) 100-10 MG/5ML syrup Take 5 mLs by mouth every 4 (four) hours as needed for cough. (Patient not taking: Reported on 06/25/2016) 118 mL 0 Not Taking at Unknown time    Current medications: Current Facility-Administered Medications  Medication Dose Route Frequency Provider Last Rate Last Dose  . 0.9 %  sodium chloride infusion  250 mL Intravenous PRN Hillary Bow, MD      . acetaminophen (TYLENOL) tablet 650 mg  650 mg Oral Q6H PRN Hillary Bow, MD       Or  . acetaminophen (TYLENOL) suppository 650 mg  650 mg Rectal Q6H PRN Srikar Sudini, MD      . albuterol (PROVENTIL) (2.5 MG/3ML) 0.083% nebulizer solution 2.5 mg  2.5 mg Nebulization Q2H PRN Hillary Bow, MD   2.5 mg at 06/26/16 0801  . amLODipine (NORVASC) tablet 10 mg  10 mg Oral Daily Hillary Bow, MD   10 mg at 06/26/16 0845  . aspirin EC tablet 81 mg  81 mg Oral Daily Hillary Bow, MD   81 mg at 06/26/16 0846  . atorvastatin (LIPITOR) tablet 20 mg  20 mg Oral q1800 Srikar Sudini, MD      . budesonide (PULMICORT) nebulizer solution 0.25 mg  0.25 mg Nebulization BID Srikar Sudini, MD   0.25 mg at 06/26/16 0800  . citalopram (CELEXA) tablet 40 mg  40 mg Oral Daily Hillary Bow, MD   40 mg at 06/26/16 0846  . docusate sodium (COLACE) capsule 100 mg  100 mg Oral BID Hillary Bow, MD   100 mg at 06/26/16 0846  . furosemide (LASIX) injection 60 mg  60 mg Intravenous BID Hillary Bow, MD   60 mg at 06/26/16 0646  . guaiFENesin-dextromethorphan (ROBITUSSIN DM) 100-10 MG/5ML syrup 5 mL  5 mL Oral Q4H PRN Hillary Bow, MD   5 mL at 06/25/16 1855  . heparin injection 5,000 Units  5,000 Units Subcutaneous Q8H Hillary Bow, MD   5,000 Units at 06/26/16 0645  . hydrALAZINE (APRESOLINE) injection 10 mg  10 mg Intravenous Q6H PRN Srikar Sudini, MD      . hydrALAZINE (APRESOLINE) tablet 100 mg  100 mg Oral Q8H Srikar Sudini, MD   100 mg at 06/26/16 0645  . HYDROcodone-acetaminophen (NORCO/VICODIN) 5-325 MG per tablet 1-2 tablet  1-2 tablet Oral Q4H PRN Hillary Bow, MD   2 tablet at 06/26/16 0850  . insulin aspart (novoLOG) injection 0-15 Units  0-15 Units Subcutaneous TID WC Hillary Bow, MD   11 Units at 06/26/16 0734  . insulin aspart (novoLOG) injection 0-5 Units  0-5 Units Subcutaneous QHS Hillary Bow, MD   4 Units at 06/25/16 2050  . insulin glargine (LANTUS) injection 45 Units  45 Units Subcutaneous QHS Hillary Bow, MD   45 Units at 06/25/16 2051  . methylPREDNISolone  sodium succinate (SOLU-MEDROL) 125 mg/2 mL injection 60 mg  60 mg Intravenous BID Hillary Bow, MD   60 mg at 06/26/16 0844  . metolazone (ZAROXOLYN) tablet 5  mg  5 mg Oral Daily Hillary Bow, MD   5 mg at 06/26/16 0845  . metoprolol (LOPRESSOR) tablet 50 mg  50 mg Oral BID Hillary Bow, MD   50 mg at 06/26/16 1000  . ondansetron (ZOFRAN) tablet 4 mg  4 mg Oral Q6H PRN Hillary Bow, MD       Or  . ondansetron (ZOFRAN) injection 4 mg  4 mg Intravenous Q6H PRN Srikar Sudini, MD      . polyethylene glycol (MIRALAX / GLYCOLAX) packet 17 g  17 g Oral Daily PRN Srikar Sudini, MD      . potassium chloride (K-DUR,KLOR-CON) CR tablet 10 mEq  10 mEq Oral BID Hillary Bow, MD   10 mEq at 06/26/16 0845  . sodium chloride flush (NS) 0.9 % injection 3 mL  3 mL Intravenous Q12H Hillary Bow, MD   3 mL at 06/26/16 0846  . sodium chloride flush (NS) 0.9 % injection 3 mL  3 mL Intravenous PRN Hillary Bow, MD   3 mL at 06/26/16 0646      Allergies: Allergies  Allergen Reactions  . Feraheme [Ferumoxytol] Itching  . Sulfa Antibiotics Itching      Past Medical History: Past Medical History:  Diagnosis Date  . Asthma   . CHF (congestive heart failure) ([place])   . COPD (chronic obstructive pulmonary disease) ([place])   . Diabetes mellitus without complication ([place])   . Hypertension   . Iron deficiency anemia   . Pericardial effusion    a. 11/2014 CT Chest: Mod effusion.  [name] Poorly controlled diabetes mellitus ([place])    a. 11/2014 A1c 13.2.     Past Surgical History: Past Surgical History:  Procedure Laterality Date  . CESAREAN SECTION       Family History: Family History  Problem Relation Age of Onset  . Diabetes Mother   . Hypertension Mother   . Hypertension Father   . Diabetes Father   . Hypertension    . Diabetes    . Breast cancer Maternal Aunt 80  . Breast cancer Maternal Aunt 50     Social History: Social History   Social History  . Marital status: Married    Spouse  name: N/A  . Number of children: N/A  . Years of education: N/A   Occupational History  . Not on file.   Social History Main Topics  . Smoking status: Never Smoker  . Smokeless tobacco: Never Used  . Alcohol use 0.0 oz/week     Comment: occ  . Drug use: No  . Sexual activity: Yes    Birth control/ protection: None   Other Topics Concern  . Not on file   Social History Narrative  . No narrative on file     Review of Systems: Review of Systems  Constitutional: Positive for malaise/fatigue. Negative for chills, diaphoresis, fever and weight loss.  HENT: Negative.  Negative for congestion, ear discharge, ear pain, hearing loss, nosebleeds, sinus pain, sore throat and tinnitus.   Eyes: Negative.  Negative for blurred vision, double vision, photophobia, pain, discharge and redness.  Respiratory: Positive for cough, sputum production, shortness of breath and wheezing. Negative for hemoptysis and stridor.   Cardiovascular: Positive for leg swelling and PND. Negative for chest pain, palpitations, orthopnea and claudication.  Gastrointestinal: Negative.  Negative for abdominal pain, blood in stool, constipation, diarrhea, heartburn, melena, nausea and vomiting.  Genitourinary: Negative.  Negative for dysuria, flank pain, frequency, hematuria and urgency.  Musculoskeletal: Negative.  Negative  for back pain, falls, joint pain, myalgias and neck pain.  Skin: Negative.  Negative for itching and rash.  Neurological: Positive for focal weakness and weakness. Negative for dizziness, tingling, tremors, sensory change, speech change, seizures, loss of consciousness and headaches.  Endo/Heme/Allergies: Negative.  Negative for environmental allergies and polydipsia. Does not bruise/bleed easily.  Psychiatric/Behavioral: Negative.  Negative for depression, hallucinations, memory loss, substance abuse and suicidal ideas. The patient is not nervous/anxious and does not have insomnia.     Vital  Signs: Blood pressure (!) 148/68, pulse 85, temperature 98.2 F (36.8 C), temperature source Oral, resp. rate 20, height 5\' 4"  (1.626 m), weight (!) 151.3 kg (333 lb 8 oz), last menstrual period 03/28/2016, SpO2 96 %.  Weight trends: Filed Weights   06/25/16 1244 06/25/16 1916 06/26/16 0716  Weight: (!) 153.3 kg (338 lb) (!) 155.5 kg (342 lb 12.8 oz) (!) 151.3 kg (333 lb 8 oz)    Physical Exam: General: NAD, sitting up in bed  Head: Normocephalic, atraumatic. Moist oral mucosal membranes  Eyes: Anicteric, PERRL  Neck: Supple, trachea midline  Lungs:  Bilateral crackles, diminished bilateral  Heart: Regular rate and rhythm  Abdomen:  Soft, nontender, obese  Extremities: 1+ nonpitting peripheral edema.  Neurologic: Nonfocal, moving all four extremities  Skin: No lesions        Lab results: Basic Metabolic Panel:  Recent Labs Lab 06/25/16 1304 06/26/16 0358 06/26/16 1043  NA 137 134*  --   K 3.8 4.1  --   CL 93* 90*  --   CO2 38* 39*  --   GLUCOSE 211* 362*  --   BUN 27* 32*  --   CREATININE 2.09* 1.95*  --   CALCIUM 8.2* 8.2*  --   PHOS  --   --  4.0    Liver Function Tests: No results for input(s): AST, ALT, ALKPHOS, BILITOT, PROT, ALBUMIN in the last 168 hours. No results for input(s): LIPASE, AMYLASE in the last 168 hours. No results for input(s): AMMONIA in the last 168 hours.  CBC:  Recent Labs Lab 06/25/16 1304 06/26/16 0358  WBC 6.3 6.9  NEUTROABS 4.8  --   HGB 9.4* 10.4*  HCT 30.4* 31.9*  MCV 85.4 86.1  PLT 390 387    Cardiac Enzymes:  Recent Labs Lab 06/25/16 1304  TROPONINI 0.03*    BNP: Invalid input(s): POCBNP  CBG:  Recent Labs Lab 06/25/16 1810 06/25/16 2038 06/26/16 0722  GLUCAP 287* 336* 328*    Microbiology: Results for orders placed or performed during the hospital encounter of 02/23/16  Urine culture     Status: Abnormal   Collection Time: 02/23/16  7:37 PM  Result Value Ref Range Status   Specimen Description  URINE, RANDOM  Final   Special Requests NONE  Final   Culture >=100,000 COLONIES/mL KLEBSIELLA PNEUMONIAE (A)  Final   Report Status 02/26/2016 FINAL  Final   Organism ID, Bacteria KLEBSIELLA PNEUMONIAE (A)  Final      Susceptibility   Klebsiella pneumoniae - MIC*    AMPICILLIN 16 RESISTANT Resistant     CEFAZOLIN <=4 SENSITIVE Sensitive     CEFTRIAXONE <=1 SENSITIVE Sensitive     CIPROFLOXACIN <=0.25 SENSITIVE Sensitive     GENTAMICIN <=1 SENSITIVE Sensitive     IMIPENEM <=0.25 SENSITIVE Sensitive     NITROFURANTOIN 32 SENSITIVE Sensitive     TRIMETH/SULFA <=20 SENSITIVE Sensitive     AMPICILLIN/SULBACTAM <=2 SENSITIVE Sensitive     PIP/TAZO <=4 SENSITIVE Sensitive  Extended ESBL NEGATIVE Sensitive     * >=100,000 COLONIES/mL KLEBSIELLA PNEUMONIAE    Coagulation Studies: No results for input(s): LABPROT, INR in the last 72 hours.  Urinalysis:  Recent Labs  06/26/16 0800  COLORURINE YELLOW*  LABSPEC 1.010  PHURINE 6.0  GLUCOSEU >=500*  HGBUR NEGATIVE  BILIRUBINUR NEGATIVE  KETONESUR NEGATIVE  PROTEINUR 100*  NITRITE NEGATIVE  LEUKOCYTESUR NEGATIVE      Imaging: Dg Chest 1 View  Result Date: 06/25/2016 CLINICAL DATA:  Shortness of breath. EXAM: CHEST 1 VIEW COMPARISON:  03/23/2016. FINDINGS: Cardiomegaly with pulmonary vascular prominence and bilateral interstitial prominence with bilateral pleural effusions consistent with CHF. Interim improvement from prior exam. No pneumothorax. No acute bony abnormality. IMPRESSION: Interim improvement congestive heart failure. Persistent pulmonary interstitial edema and bilateral pleural effusions remain. Electronically Signed   By: Marcello Moores  Register   On: 06/25/2016 13:37   US Renal  Result Date: 06/26/2016 CLINICAL DATA:  Acute renal failure.  Chronic renal disease. EXAM: RENAL / URINARY TRACT ULTRASOUND COMPLETE COMPARISON:  CT 10/28/2014. FINDINGS: Right Kidney: Length: 12.5 cm. Increased echogenicity consistent chronic  medical renal disease. No mass or hydronephrosis visualized. Left Kidney: Length: 12.0 cm. Increased echogenicity consistent chronic medical renal disease. The area of the previously identified nonspecific density noted arising from the medial left kidney on CT of 10/28/2014 is not visualized. No mass or hydronephrosis visualized. Bladder: Not visualized. IMPRESSION: 1. Bilateral increased renal echogenicity consistent with chronic medical renal disease. 2. The area of the previously identified nonspecific density noted arising from the medial left kidney on CT of 10/28/2014 is not visualized. No focal renal lesion identified. No hydronephrosis. Bladder nondistended. Electronically Signed   By: Marcello Moores  Register   On: 06/26/2016 11:52   Dg Knee Complete 4 Views Right  Result Date: 06/25/2016 CLINICAL DATA:  46 year old female status post fall this morning down a step with right knee injury. Pain. Initial encounter. EXAM: RIGHT KNEE - COMPLETE 4+ VIEW COMPARISON:  None. FINDINGS: Evidence of widespread subcutaneous edema in the right lower extremity. Calcified peripheral vascular disease in the distal right femoral and popliteal artery is advanced for age. There is possibly a healed deformity of the right fibula proximal metadiaphysis, but no acute fracture or dislocation identified about the left knee. Possible small joint effusion. IMPRESSION: 1. No acute fracture or dislocation identified about the right knee. Possible small joint effusion. 2. Calcified peripheral vascular disease and possibly widespread soft tissue edema in the right lower extremity. Electronically Signed   By: Genevie Ann M.D.   On: 06/25/2016 14:29      Assessment & Plan: Ms. CARMELL ELGIN is a 46 y.o. black female with congestive heart failure, COPD/asthma, hypertension, diabetes mellitus type II, anemia, anxeity, who was admitted to [facility] on 06/25/2016 for Anasarca [R60.1] Hypoxia [R09.02] Acute renal failure, unspecified acute renal  failure type ([place]) [N17.9]   1. Acute renal failure on chronic kidney disease stage III with proteinuria: Baseline creatinine of 1.62, GFR of 44 on 04/29/2016.  Chronic kidney disease secondary to diabetic nephropathy, hypertension Acute renal failure from acute exacerbation of congestive heart failure with acute cardiorenal syndrome. No acute indication for dialysis.  - Hold losartan - Renally dose all medications.  - Check renal ultrasound - Check SPEP/UPEP, PTH, spot urine protein to creatinine ratio.   2. Hypertension/ acute exacerbation of congestive heart failure: holding losartan  - IV furosemide 60mg  bid and metolazone 5mg  daily PO - amlodipine, metoprolol.   3. Diabetes mellitus type II  with chronic kidney disease: uncontrolled.  - Check hemoglobin A1c   4. Anemia with chronic kidney disease: Hemoglobin 10.4   LOS: 1 Ryen Rhames 5/2/201811:56 AM

## 2016-06-26 NOTE — Progress Notes (Signed)
[place] [facility] [place] NAME: [name]    MR#:  [id number]  DATE OF BIRTH:  1971/01/20  SUBJECTIVE:  CHIEF COMPLAINT:   Chief Complaint  Patient presents with  . Weakness     Came with increasing swelling on legs and SOB with hypoxia, not taking meds regularly.    Also have some worsening in renal function.  REVIEW OF SYSTEMS:   Constitutional: Positive for malaise/fatigue. Negative for chills and fever.  HENT: Negative for sore throat.   Eyes: Negative for blurred vision, double vision and pain.  Respiratory: Positive for cough and shortness of breath. Negative for hemoptysis and wheezing.   Cardiovascular: Positive for orthopnea and leg swelling. Negative for chest pain and palpitations.  Gastrointestinal: Negative for abdominal pain, constipation, diarrhea, heartburn, nausea and vomiting.  Genitourinary: Negative for dysuria and hematuria.  Musculoskeletal: Positive for back pain and falls. Negative for joint pain.  Skin: Negative for rash.  Neurological: Positive for weakness. Negative for sensory change, speech change, focal weakness and headaches.  Endo/Heme/Allergies: Does not bruise/bleed easily.  Psychiatric/Behavioral: Negative for depression. The patient is not nervous/anxious  ROS  DRUG ALLERGIES:   Allergies  Allergen Reactions  . Feraheme [Ferumoxytol] Itching  . Sulfa Antibiotics Itching    VITALS:  Blood pressure (!) 153/78, pulse 85, temperature 98.4 F (36.9 C), temperature source Oral, resp. rate 20, height 5\' 4"  (1.626 m), weight (!) 151.3 kg (333 lb 8 oz), last menstrual period 03/28/2016, SpO2 93 %.  PHYSICAL EXAMINATION:   GENERAL:  46 y.o.-year-old patient lying in the bed , Conversational dyspnea. Morbidly obese EYES: Pupils equal, round, reactive to light and accommodation. No scleral icterus. Extraocular muscles intact.  HEENT: Head atraumatic, normocephalic. Oropharynx and nasopharynx clear. No  oropharyngeal erythema, moist oral mucosa  NECK:  Supple, no jugular venous distention. No thyroid enlargement, no tenderness.  LUNGS: Bilateral wheezing with decreased air entry CARDIOVASCULAR: S1, S2 normal. No murmurs, rubs, or gallops.  ABDOMEN: Soft, nontender, nondistended. Bowel sounds present. No organomegaly or mass.  EXTREMITIES: No cyanosis, or clubbing. + 2 pedal & radial pulses b/l.  Bilateral lower extremity edema up to her thighs NEUROLOGIC: Cranial nerves II through XII are intact. No focal Motor or sensory deficits appreciated b/l PSYCHIATRIC: The patient is alert and oriented x 3. Good affect.  SKIN: No obvious rash, lesion, or ulcer.   Physical Exam LABORATORY PANEL:   CBC  Recent Labs Lab 06/26/16 0358  WBC 6.9  HGB 10.4*  HCT 31.9*  PLT 387   ------------------------------------------------------------------------------------------------------------------  Chemistries   Recent Labs Lab 06/26/16 0358  NA 134*  K 4.1  CL 90*  CO2 39*  GLUCOSE 362*  BUN 32*  CREATININE 1.95*  CALCIUM 8.2*   ------------------------------------------------------------------------------------------------------------------  Cardiac Enzymes  Recent Labs Lab 06/25/16 1304 06/26/16 1226  TROPONINI 0.03* 0.03*   ------------------------------------------------------------------------------------------------------------------  RADIOLOGY:  Dg Chest 1 View  Result Date: 06/25/2016 CLINICAL DATA:  Shortness of breath. EXAM: CHEST 1 VIEW COMPARISON:  03/23/2016. FINDINGS: Cardiomegaly with pulmonary vascular prominence and bilateral interstitial prominence with bilateral pleural effusions consistent with CHF. Interim improvement from prior exam. No pneumothorax. No acute bony abnormality. IMPRESSION: Interim improvement congestive heart failure. Persistent pulmonary interstitial edema and bilateral pleural effusions remain. Electronically Signed   By: Marcello Moores  Register   On:  06/25/2016 13:37   US Renal  Result Date: 06/26/2016 CLINICAL DATA:  Acute renal failure.  Chronic renal disease. EXAM: RENAL / URINARY TRACT ULTRASOUND COMPLETE  COMPARISON:  CT 10/28/2014. FINDINGS: Right Kidney: Length: 12.5 cm. Increased echogenicity consistent chronic medical renal disease. No mass or hydronephrosis visualized. Left Kidney: Length: 12.0 cm. Increased echogenicity consistent chronic medical renal disease. The area of the previously identified nonspecific density noted arising from the medial left kidney on CT of 10/28/2014 is not visualized. No mass or hydronephrosis visualized. Bladder: Not visualized. IMPRESSION: 1. Bilateral increased renal echogenicity consistent with chronic medical renal disease. 2. The area of the previously identified nonspecific density noted arising from the medial left kidney on CT of 10/28/2014 is not visualized. No focal renal lesion identified. No hydronephrosis. Bladder nondistended. Electronically Signed   By: Marcello Moores  Register   On: 06/26/2016 11:52   Dg Knee Complete 4 Views Right  Result Date: 06/25/2016 CLINICAL DATA:  46 year old female status post fall this morning down a step with right knee injury. Pain. Initial encounter. EXAM: RIGHT KNEE - COMPLETE 4+ VIEW COMPARISON:  None. FINDINGS: Evidence of widespread subcutaneous edema in the right lower extremity. Calcified peripheral vascular disease in the distal right femoral and popliteal artery is advanced for age. There is possibly a healed deformity of the right fibula proximal metadiaphysis, but no acute fracture or dislocation identified about the left knee. Possible small joint effusion. IMPRESSION: 1. No acute fracture or dislocation identified about the right knee. Possible small joint effusion. 2. Calcified peripheral vascular disease and possibly widespread soft tissue edema in the right lower extremity. Electronically Signed   By: Genevie Ann M.D.   On: 06/25/2016 14:29    ASSESSMENT AND PLAN:    Active Problems:   Hypertensive heart disease with congestive heart failure (HCC)   Acute on chronic diastolic CHF (congestive heart failure) (HCC)   Morbid obesity (HCC)   Iron deficiency anemia   Poorly controlled type 2 diabetes mellitus (HCC)   Asthma   COPD exacerbation (HCC)   Acute on chronic respiratory failure (HCC)   CKD (chronic kidney disease), stage III  * Acute on chronic respiratory failure due to combination of congestive heart failure and COPD. Nebulizers when necessary. Wean oxygen as tolerated. Present on 5 L nasal cannula. Normally on 3-4 L at home.  * Acute on chronic diastolic congestive heart failure - IV Lasix, Beta blockers - Input and Output - Counseled to limit fluids and Salt - Monitor Bun/Cr and Potassium - Echo - reviewed. reorder -Cardiology consult appreciated.  * Acute COPD exacerbation. -IV steroids - Scheduled Nebulizers - Inhalers -Wean O2 as tolerated - Consult pulmonary if no improvement  * Acute renal failure on CKD stage III. Likely cardiorenal syndrome. Continue Lasix. Consult nephrology. Monitor input and output. Daily weight.   Renal sono without obstructions.  * Insulin dependent diabetes mellitus. Continue patient's Levemir. Sliding scale insulin. Will likely be uncontrolled due to IV steroid use.  * DVT prophylaxis with heparin      All the records are reviewed and case discussed with Care Management/Social Workerr. Management plans discussed with the patient, family and they are in agreement.  CODE STATUS: full  TOTAL TIME TAKING CARE OF THIS PATIENT: 35 minutes.     POSSIBLE D/C IN 1-2 DAYS, DEPENDING ON CLINICAL CONDITION.   Vaughan Basta M.D on 06/26/2016   Between 7am to 6pm - Pager - [phone]  After 6pm go to [email or web address] - password [email or web address] [facility]  Office  [phone]  CC: Primary care physician; [name], NP  Note: This dictation was prepared  with Dragon dictation along with  smaller phrase technology. Any transcriptional errors that result from this process are unintentional.

## 2016-06-26 NOTE — Plan of Care (Signed)
Problem: Fluid Volume: Goal: Ability to maintain a balanced intake and output will improve Outcome: Progressing Remains edematous but diuresing after IV Lasix.  Remains dyspneic on exertion with saturations >92 on 4LNC.

## 2016-06-27 ENCOUNTER — Ambulatory Visit: Payer: Self-pay | Admitting: Cardiovascular Disease

## 2016-06-27 ENCOUNTER — Inpatient Hospital Stay (HOSPITAL_COMMUNITY)
Admit: 2016-06-27 | Discharge: 2016-06-27 | Disposition: A | Discharge status: Home / Self Care | Payer: Medicaid Other | Attending: Cardiovascular Disease | Admitting: Cardiovascular Disease

## 2016-06-27 DIAGNOSIS — I5031 Acute diastolic (congestive) heart failure: Secondary | ICD-10-CM

## 2016-06-27 DIAGNOSIS — R0902 Hypoxemia: Secondary | ICD-10-CM

## 2016-06-27 LAB — PROTEIN ELECTROPHORESIS, SERUM
A/G RATIO SPE: 0.8 (ref 0.7–1.7)
Albumin ELP: 2.6 g/dL — ABNORMAL LOW (ref 2.9–4.4)
Alpha-1-Globulin: 0.2 g/dL (ref 0.0–0.4)
Alpha-2-Globulin: 0.7 g/dL (ref 0.4–1.0)
BETA GLOBULIN: 1 g/dL (ref 0.7–1.3)
Gamma Globulin: 1.1 g/dL (ref 0.4–1.8)
Globulin, Total: 3.1 g/dL (ref 2.2–3.9)
Total Protein ELP: 5.7 g/dL — ABNORMAL LOW (ref 6.0–8.5)

## 2016-06-27 LAB — GLUCOSE, CAPILLARY
GLUCOSE-CAPILLARY: 340 mg/dL — AB (ref 65–99)
GLUCOSE-CAPILLARY: 268 mg/dL — AB (ref 65–99)
Glucose-Capillary: 417 mg/dL — ABNORMAL HIGH (ref 65–99)
Glucose-Capillary: 372 mg/dL — ABNORMAL HIGH (ref 65–99)

## 2016-06-27 LAB — ECHOCARDIOGRAM COMPLETE
FS: 33 % (ref 28–44)
Height: 64 in
IV/PV OW: 0.73
LA diam end sys: 51 mm
LADIAMINDEX: 2.09 cm/m2
LASIZE: 51 mm
LVOT area: 3.8 cm2
LVOT diameter: 22 mm
PW: 20.1 mm — AB (ref 0.6–1.1)
Weight: 5403.2 oz

## 2016-06-27 LAB — RENAL FUNCTION PANEL
ANION GAP: 6 (ref 5–15)
Albumin: 2.7 g/dL — ABNORMAL LOW (ref 3.5–5.0)
BUN: 43 mg/dL — AB (ref 6–20)
CHLORIDE: 89 mmol/L — AB (ref 101–111)
CO2: 39 mmol/L — AB (ref 22–32)
Calcium: 8.4 mg/dL — ABNORMAL LOW (ref 8.9–10.3)
Creatinine, Ser: 2.14 mg/dL — ABNORMAL HIGH (ref 0.44–1.00)
GFR calc Af Amer: 31 mL/min — ABNORMAL LOW (ref >60)
GFR calc non Af Amer: 27 mL/min — ABNORMAL LOW (ref >60)
GLUCOSE: 360 mg/dL — AB (ref 65–99)
POTASSIUM: 4.3 mmol/L (ref 3.5–5.1)
Phosphorus: 4.3 mg/dL (ref 2.5–4.6)
Sodium: 134 mmol/L — ABNORMAL LOW (ref 135–145)

## 2016-06-27 LAB — HEMOGLOBIN A1C
Hgb A1c MFr Bld: 8.2 % — ABNORMAL HIGH (ref 4.8–5.6)
Mean Plasma Glucose: 189 mg/dL

## 2016-06-27 LAB — PROTEIN ELECTRO, RANDOM URINE
ALPHA-2-GLOBULIN, U: 7.2 %
Albumin ELP, Urine: 57.8 %
Alpha-1-Globulin, U: 8.1 %
BETA GLOBULIN, U: 12.4 %
GAMMA GLOBULIN, U: 14.6 %
Total Protein, Urine: 378.2 mg/dL

## 2016-06-27 LAB — HIV ANTIBODY (ROUTINE TESTING W REFLEX): HIV Screen 4th Generation wRfx: NONREACTIVE

## 2016-06-27 LAB — KAPPA/LAMBDA LIGHT CHAINS
KAPPA FREE LGHT CHN: 96.1 mg/L — AB (ref 3.3–19.4)
KAPPA, LAMDA LIGHT CHAIN RATIO: 3.02 — AB (ref 0.26–1.65)
LAMDA FREE LIGHT CHAINS: 31.8 mg/L — AB (ref 5.7–26.3)

## 2016-06-27 LAB — PARATHYROID HORMONE, INTACT (NO CA): PTH: 134 pg/mL — AB (ref 15–65)

## 2016-06-27 LAB — TROPONIN I: Troponin I: 0.03 ng/mL (ref <0.03)

## 2016-06-27 MED ORDER — TORSEMIDE 20 MG PO TABS
20.0000 mg | ORAL_TABLET | Freq: Two times a day (BID) | ORAL | Status: DC
  Administered 2016-06-27 – 2016-06-28 (×2): 20 mg via ORAL
  Filled 2016-06-27 (×2): qty 1

## 2016-06-27 MED ORDER — INSULIN ASPART 100 UNIT/ML ~~LOC~~ SOLN
11.0000 [IU] | Freq: Three times a day (TID) | SUBCUTANEOUS | Status: DC
  Administered 2016-06-27 – 2016-06-29 (×8): 11 [IU] via SUBCUTANEOUS
  Filled 2016-06-27 (×8): qty 11

## 2016-06-27 MED ORDER — METOPROLOL TARTRATE 50 MG PO TABS
75.0000 mg | ORAL_TABLET | Freq: Two times a day (BID) | ORAL | Status: DC
  Administered 2016-06-27 – 2016-06-29 (×4): 75 mg via ORAL
  Filled 2016-06-27 (×4): qty 1

## 2016-06-27 NOTE — Progress Notes (Signed)
Stand by assist to [facility]. Patient refuses staff supervision, would like privacy while using the restroom. Educated on safety. Telephone and call bell within reach- agrees to call before getting up.

## 2016-06-27 NOTE — Progress Notes (Signed)
Patient Name: [name] Date of Encounter: 06/27/2016  Primary Cardiologist: [facility] Problem List     Active Problems:   Acute on chronic respiratory failure [facility])   Hypertensive heart disease with congestive heart failure (HCC)   Acute on chronic diastolic CHF (congestive heart failure) (HCC)   Morbid obesity ([place])   Iron deficiency anemia   Poorly controlled type 2 diabetes mellitus (HCC)   Asthma   COPD exacerbation (HCC)   CKD (chronic kidney disease), stage III     Subjective   Cough that is productive of thick-green sputum persists. Renal function worsened with IV Lasix 60 mg bid. UOP of 1.5 L for the admission. Weight 337 today, up 4 pounds from 5/2.   Inpatient Medications    Scheduled Meds: . amLODipine  10 mg Oral Daily  . aspirin EC  81 mg Oral Daily  . atorvastatin  20 mg Oral q1800  . budesonide  0.25 mg Nebulization BID  . citalopram  40 mg Oral Daily  . docusate sodium  100 mg Oral BID  . furosemide  60 mg Intravenous BID  . heparin  5,000 Units Subcutaneous Q8H  . hydrALAZINE  100 mg Oral Q8H  . insulin aspart  0-15 Units Subcutaneous TID WC  . insulin aspart  0-5 Units Subcutaneous QHS  . insulin aspart  11 Units Subcutaneous TID WC  . insulin glargine  50 Units Subcutaneous QHS  . methylPREDNISolone (SOLU-MEDROL) injection  60 mg Intravenous BID  . metolazone  5 mg Oral Daily  . metoprolol  50 mg Oral BID  . potassium chloride  10 mEq Oral BID  . sodium chloride flush  3 mL Intravenous Q12H   Continuous Infusions: . sodium chloride     PRN Meds: sodium chloride, acetaminophen **OR** acetaminophen, albuterol, guaiFENesin-dextromethorphan, hydrALAZINE, HYDROcodone-acetaminophen, ondansetron **OR** ondansetron (ZOFRAN) IV, polyethylene glycol, sodium chloride flush   Vital Signs    Vitals:   06/26/16 1953 06/26/16 2051 06/27/16 0422 06/27/16 0835  BP: (!) 160/81  (!) 162/85 (!) 164/85  Pulse: 82  70 71  Resp: 18  18   Temp:  98.6 F (37 C)  98.4 F (36.9 C)   TempSrc: Oral  Oral   SpO2: 100% 94% 97%   Weight:   (!) 337 lb 11.2 oz (153.2 kg)   Height:        Intake/Output Summary (Last 24 hours) at 06/27/16 1108 Last data filed at 06/27/16 0926  Gross per 24 hour  Intake              480 ml  Output             1150 ml  Net             -670 ml   Filed Weights   06/25/16 1916 06/26/16 0716 06/27/16 0422  Weight: (!) 342 lb 12.8 oz (155.5 kg) (!) 333 lb 8 oz (151.3 kg) (!) 337 lb 11.2 oz (153.2 kg)    Physical Exam    GEN: Well nourished, well developed, in no acute distress.  HEENT: Grossly normal.  Neck: Supple, JVD difficult to assess 2/2 body habitus, no carotid bruits, or masses. Cardiac: RRR, no murmurs, rubs, or gallops. No clubbing, cyanosis, 1+ LE pitting edema.  Radials/DP/PT 2+ and equal bilaterally.  Respiratory:  Diminished breathsounds bilaterally with expiratory wheezing. On supplemental oxygen at 4 L via nasal cannula.  GI: Obese, soft, nontender, nondistended, BS + x 4. MS: no deformity  or atrophy. Skin: warm and dry, no rash. Neuro:  Strength and sensation are intact. Psych: AAOx3.  Normal affect.  Labs    CBC  Recent Labs  06/25/16 1304 06/26/16 0358  WBC 6.3 6.9  NEUTROABS 4.8  --   HGB 9.4* 10.4*  HCT 30.4* 31.9*  MCV 85.4 86.1  PLT 390 397   Basic Metabolic Panel  Recent Labs  06/26/16 0358 06/26/16 1043 06/27/16 0006  NA 134*  --  134*  K 4.1  --  4.3  CL 90*  --  89*  CO2 39*  --  39*  GLUCOSE 362*  --  360*  BUN 32*  --  43*  CREATININE 1.95*  --  2.14*  CALCIUM 8.2*  --  8.4*  PHOS  --  4.0 4.3   Liver Function Tests  Recent Labs  06/27/16 0006  ALBUMIN 2.7*   No results for input(s): LIPASE, AMYLASE in the last 72 hours. Cardiac Enzymes  Recent Labs  06/26/16 1226 06/26/16 1754 06/27/16 0006  TROPONINI 0.03* 0.03* 0.03*   BNP Invalid input(s): POCBNP D-Dimer No results for input(s): DDIMER in the last 72 hours. Hemoglobin  A1C  Recent Labs  06/26/16 0358  HGBA1C 8.2*   Fasting Lipid Panel No results for input(s): CHOL, HDL, LDLCALC, TRIG, CHOLHDL, LDLDIRECT in the last 72 hours. Thyroid Function Tests  Recent Labs  06/26/16 1754  TSH 1.019    Telemetry    Sinus rhythm - Personally Reviewed  ECG    n/a - Personally Reviewed  Radiology    Dg Chest 1 View  Result Date: 06/25/2016 CLINICAL DATA:  Shortness of breath. EXAM: CHEST 1 VIEW COMPARISON:  03/23/2016. FINDINGS: Cardiomegaly with pulmonary vascular prominence and bilateral interstitial prominence with bilateral pleural effusions consistent with CHF. Interim improvement from prior exam. No pneumothorax. No acute bony abnormality. IMPRESSION: Interim improvement congestive heart failure. Persistent pulmonary interstitial edema and bilateral pleural effusions remain. Electronically Signed   By: Marcello Moores  Register   On: 06/25/2016 13:37   US Renal  Result Date: 06/26/2016 CLINICAL DATA:  Acute renal failure.  Chronic renal disease. EXAM: RENAL / URINARY TRACT ULTRASOUND COMPLETE COMPARISON:  CT 10/28/2014. FINDINGS: Right Kidney: Length: 12.5 cm. Increased echogenicity consistent chronic medical renal disease. No mass or hydronephrosis visualized. Left Kidney: Length: 12.0 cm. Increased echogenicity consistent chronic medical renal disease. The area of the previously identified nonspecific density noted arising from the medial left kidney on CT of 10/28/2014 is not visualized. No mass or hydronephrosis visualized. Bladder: Not visualized. IMPRESSION: 1. Bilateral increased renal echogenicity consistent with chronic medical renal disease. 2. The area of the previously identified nonspecific density noted arising from the medial left kidney on CT of 10/28/2014 is not visualized. No focal renal lesion identified. No hydronephrosis. Bladder nondistended. Electronically Signed   By: Marcello Moores  Register   On: 06/26/2016 11:52   Dg Knee Complete 4 Views  Right  Result Date: 06/25/2016 CLINICAL DATA:  46 year old female status post fall this morning down a step with right knee injury. Pain. Initial encounter. EXAM: RIGHT KNEE - COMPLETE 4+ VIEW COMPARISON:  None. FINDINGS: Evidence of widespread subcutaneous edema in the right lower extremity. Calcified peripheral vascular disease in the distal right femoral and popliteal artery is advanced for age. There is possibly a healed deformity of the right fibula proximal metadiaphysis, but no acute fracture or dislocation identified about the left knee. Possible small joint effusion. IMPRESSION: 1. No acute fracture or dislocation identified about the right  knee. Possible small joint effusion. 2. Calcified peripheral vascular disease and possibly widespread soft tissue edema in the right lower extremity. Electronically Signed   By: Genevie Ann M.D.   On: 06/25/2016 14:29    Cardiac Studies   TTE pending  Patient Profile     46 y.o. female with history of chronic diastolic CHF, chronic respiratory failure on home oxygen at 3-4 L secondary to COPD, CKD stage III, anemia in the setting of heavy menses requiring iron infusions, asthma, morbid obesity, and medication noncompliance who presented to [facility] on 5/1 with weakness and was found to have acute on chronic respiratory failure 2/2 AECOPD and acute on chronic diastolic CHF 2/2 medication noncompliance.  Assessment & Plan    1. Acute on chronic respiratory failure with hypoxia: -Likely multifactorial including COPD exacerbation, acute on chronic diastolic CHF 2/2 medication and dietary noncompliance, iron deficiency anemia, morbid obesity with likely OHS -Wean oxygen to baseline as able per IM  2. Acute on chronic diastolic CHF: -Likely in the setting of medication noncompliance -Renal function worsened with IV diuresis -Change IV Lasix to torsemide 20 mg bid, weight up 4 pounds today -Some of her LE swelling is likely 2/2 amlodipine, consider alternative  antihypertensive  -Renal on board to assist with diuresis given her renal disease -TTE pending -Hold daily metolazone   3. Hypertensive heart disease: -BP remains elevated -Increase metoprolol to 75 mg bid -Continue amlodipine, hydralazine, and torsemide as above  4. Mildly elevated troponin: -Minimally elevated with a peak of 0.03 -Likely supply demand ischemia in the setting of the above -Echo pending -No heparin gtt at this time  5. AECOPD/bronchitis: -Per IM  6. Anemia: -Per IM  7. Morbid obesity/possible OHS: -Weight loss advised -Would benefit from a dietitian   8. Acute on CKD stage III: -Renal on board -Renal function worsening with diuresis   -Change IV Lasix to torsemide 20 mg bid -Hold metolazone   Signed, Christell Faith, PA-C [facility] Pager: [phone] 06/27/2016, 11:08 AM   Attending Note Patient seen and examined, agree with detailed note above,  Patient presentation and plan discussed on rounds.    she reports that she is not urinating very much on IV Lasix   denies having significant shortness of breath but has significant coughing   cough is nonproductive but thick, the past several days   some general malaise   On exam morbid obesity, no distress, unable to estimate JVP, on auscultation she has diffuse scant Rales, expiratory wheezing, heart sounds regular with no murmurs appreciated, abdomen obese soft nontender no significant lower extremity edema  Lab work reviewed showing climb in creatinine and BUN up to 2.14 and 43 respectively  ----Acute on chronic respiratory failure with hypoxia Likely multifactorial Minimal diuretics given this hospital admission now with prerenal state Echocardiogram with normal EF, grossly not fluid overloaded Suspect much of her symptoms are secondary to bronchitis She has significant cough past several days She may benefit from antibiotics Also large component of obesity hypoventilation  syndrome She may benefit from sleep study, May need BiPAP as an outpatient Would recommend she see pulmonary Strongly recommended weight loss   Greater than 50% was spent in counseling and coordination of care with patient Total encounter time 25 minutes or more   Signed: Esmond Plants  M.D., Ph.D. [facility]

## 2016-06-27 NOTE — Discharge Instructions (Signed)
Heart Failure Clinic appointment on Jul 03, 2016 at 11:40am with [name], [place]. Please call [phone] to reschedule.

## 2016-06-27 NOTE — Progress Notes (Signed)
Chaplain tried to engage patient in conversation but she was visibly tired. She appreciated the visit and Chaplain left her alone so that she would rest.   06/27/16 1400  Clinical Encounter Type  Visited With Patient  Visit Type Initial  Spiritual Encounters  Spiritual Needs Emotional

## 2016-06-27 NOTE — Progress Notes (Signed)
[facility] [place] NAME: [name]    MR#:  [id number]  DATE OF BIRTH:  1970/10/31  SUBJECTIVE:  CHIEF COMPLAINT:   Chief Complaint  Patient presents with  . Weakness     Came with increasing swelling on legs and SOB with hypoxia, not taking meds regularly.    Also have some worsening in renal function.   Have diuresis with lasix, but not very much.  REVIEW OF SYSTEMS:   Constitutional: Positive for malaise/fatigue. Negative for chills and fever.  HENT: Negative for sore throat.   Eyes: Negative for blurred vision, double vision and pain.  Respiratory: Positive for cough and shortness of breath. Negative for hemoptysis and wheezing.   Cardiovascular: Positive for orthopnea and leg swelling. Negative for chest pain and palpitations.  Gastrointestinal: Negative for abdominal pain, constipation, diarrhea, heartburn, nausea and vomiting.  Genitourinary: Negative for dysuria and hematuria.  Musculoskeletal: Positive for back pain and falls. Negative for joint pain.  Skin: Negative for rash.  Neurological: Positive for weakness. Negative for sensory change, speech change, focal weakness and headaches.  Endo/Heme/Allergies: Does not bruise/bleed easily.  Psychiatric/Behavioral: Negative for depression. The patient is not nervous/anxious  ROS  DRUG ALLERGIES:   Allergies  Allergen Reactions  . Feraheme [Ferumoxytol] Itching  . Sulfa Antibiotics Itching    VITALS:  Blood pressure (!) 170/82, pulse 77, temperature 99 F (37.2 C), temperature source Oral, resp. rate 20, height 5\' 4"  (1.626 m), weight (!) 153.2 kg (337 lb 11.2 oz), last menstrual period 03/28/2016, SpO2 93 %.  PHYSICAL EXAMINATION:   GENERAL:  46 y.o.-year-old patient lying in the bed , Conversational dyspnea. Morbidly obese EYES: Pupils equal, round, reactive to light and accommodation. No scleral icterus. Extraocular muscles intact.  HEENT: Head atraumatic,  normocephalic. Oropharynx and nasopharynx clear. No oropharyngeal erythema, moist oral mucosa  NECK:  Supple, no jugular venous distention. No thyroid enlargement, no tenderness.  LUNGS: Bilateral wheezing with decreased air entry CARDIOVASCULAR: S1, S2 normal. No murmurs, rubs, or gallops.  ABDOMEN: Soft, nontender, nondistended. Bowel sounds present. No organomegaly or mass.  EXTREMITIES: No cyanosis, or clubbing. + 2 pedal & radial pulses b/l.  Bilateral lower extremity edema up to her thighs NEUROLOGIC: Cranial nerves II through XII are intact. No focal Motor or sensory deficits appreciated b/l PSYCHIATRIC: The patient is alert and oriented x 3. Good affect.  SKIN: No obvious rash, lesion, or ulcer.   Physical Exam LABORATORY PANEL:   CBC  Recent Labs Lab 06/26/16 0358  WBC 6.9  HGB 10.4*  HCT 31.9*  PLT 387   ------------------------------------------------------------------------------------------------------------------  Chemistries   Recent Labs Lab 06/27/16 0006  NA 134*  K 4.3  CL 89*  CO2 39*  GLUCOSE 360*  BUN 43*  CREATININE 2.14*  CALCIUM 8.4*   ------------------------------------------------------------------------------------------------------------------  Cardiac Enzymes  Recent Labs Lab 06/26/16 1754 06/27/16 0006  TROPONINI 0.03* 0.03*   ------------------------------------------------------------------------------------------------------------------  RADIOLOGY:  US Renal  Result Date: 06/26/2016 CLINICAL DATA:  Acute renal failure.  Chronic renal disease. EXAM: RENAL / URINARY TRACT ULTRASOUND COMPLETE COMPARISON:  CT 10/28/2014. FINDINGS: Right Kidney: Length: 12.5 cm. Increased echogenicity consistent chronic medical renal disease. No mass or hydronephrosis visualized. Left Kidney: Length: 12.0 cm. Increased echogenicity consistent chronic medical renal disease. The area of the previously identified nonspecific density noted arising from the  medial left kidney on CT of 10/28/2014 is not visualized. No mass or hydronephrosis visualized. Bladder: Not visualized. IMPRESSION: 1. Bilateral increased renal  echogenicity consistent with chronic medical renal disease. 2. The area of the previously identified nonspecific density noted arising from the medial left kidney on CT of 10/28/2014 is not visualized. No focal renal lesion identified. No hydronephrosis. Bladder nondistended. Electronically Signed   By: Marcello Moores  Register   On: 06/26/2016 11:52    ASSESSMENT AND PLAN:   Active Problems:   Hypertensive heart disease with congestive heart failure (HCC)   Acute on chronic diastolic CHF (congestive heart failure) (HCC)   Morbid obesity (HCC)   Iron deficiency anemia   Poorly controlled type 2 diabetes mellitus (HCC)   Asthma   COPD exacerbation (HCC)   Acute on chronic respiratory failure (HCC)   CKD (chronic kidney disease), stage III  * Acute on chronic respiratory failure due to combination of congestive heart failure and COPD. Nebulizers when necessary. Wean oxygen as tolerated. Present on 5 L nasal cannula. Normally on 3-4 L at home.  * Acute on chronic diastolic congestive heart failure - IV Lasix, Beta blockers - Input and Output - Counseled to limit fluids and Salt - Monitor Bun/Cr and Potassium - Echo - reviewed. reorder -Cardiology consult appreciated.  * Acute COPD exacerbation. -IV steroids - Scheduled Nebulizers - Inhalers -Wean O2 as tolerated  * Acute renal failure on CKD stage III. Likely cardiorenal syndrome. Continue Lasix. Consult nephrology. Monitor input and output. Daily weight.   Renal sono without obstructions.  * Insulin dependent diabetes mellitus. Continue patient's Levemir. Sliding scale insulin. Will likely be uncontrolled due to IV steroid use.  * DVT prophylaxis with heparin    All the records are reviewed and case discussed with Care Management/Social Workerr. Management plans  discussed with the patient, family and they are in agreement.  CODE STATUS: full  TOTAL TIME TAKING CARE OF THIS PATIENT: 35 minutes.     POSSIBLE D/C IN 1-2 DAYS, DEPENDING ON CLINICAL CONDITION.   Vaughan Basta M.D on 06/27/2016   Between 7am to 6pm - Pager - [phone]  After 6pm go to [email or web address] - password [email or web address] [facility]  Office  [phone]  CC: Primary care physician; [name], NP  Note: This dictation was prepared with Dragon dictation along with smaller phrase technology. Any transcriptional errors that result from this process are unintentional.

## 2016-06-27 NOTE — Progress Notes (Signed)
Inpatient Diabetes Program Recommendations  AACE/ADA: New Consensus Statement on Inpatient Glycemic Control (2015)  Target Ranges:  Prepandial:   less than 140 mg/dL      Peak postprandial:   less than 180 mg/dL (1-2 hours)      Critically ill patients:  140 - 180 mg/dL   Lab Results  Component Value Date   GLUCAP 417 (H) 06/27/2016   HGBA1C 8.2 (H) 06/26/2016    Review of Glycemic Control  Results for Palladino, [name] (MRN [id number]) as of 06/27/2016 08:10  Ref. Range 06/26/2016 07:22 06/26/2016 12:40 06/26/2016 16:37 06/26/2016 21:39 06/27/2016 07:58  Glucose-Capillary Latest Ref Range: 65 - 99 mg/dL 328 (H) 281 (H) 386 (H) 344 (H) 417 (H)    Diabetes history: DM2 Outpatient Diabetes medications: Tresiba 45 units QHS, Novolog 0-12 units TID with meals, Metformin 500 mg BID Current orders for Inpatient glycemic control: Lantus 50 units QHS, Novolog 0-15 units TID with meals, Novolog 0-5 units QHS, Novolog 8 units tid  Inpatient Diabetes Program Recommendations:  Insulin - Basal: If steroids are continued as ordered, please consider increasing Lantus to 54 units QHS (based on 153 kg x 0.35 units)- fasting blood sugar remains elevated  Insulin - Meal Coverage: Please consider ordering Novolog 11 units TID with meals for meal coverage if patient eats at least 50% of meals - post prandial blood sugars remain elevated  Gentry Fitz, RN, [place], [place], CDE Diabetes Coordinator Inpatient Diabetes Program  [phone] (Team Pager) [phone] ([place]) 06/27/2016 8:15 AM

## 2016-06-27 NOTE — Plan of Care (Signed)
Problem: Activity: Goal: Capacity to carry out activities will improve Outcome: Not Progressing Generalized weakness with poor tolerance to activity continues. Dyspnea on exertion to bedside commode.

## 2016-06-27 NOTE — Progress Notes (Signed)
Dr. Anselm Jungling aware of CBG 417. Order to give 15 units of novolog sliding scale and change meal coverage to 11 units novolog.

## 2016-06-27 NOTE — Progress Notes (Signed)
[facility]  ROUNDING NOTE   Subjective:   Na 134  Creatinine 2.14 (1.95)  Furosemide 60mg  IV bid  Objective:  Vital signs in last 24 hours:  Temp:  [98.4 F (36.9 C)-98.6 F (37 C)] 98.4 F (36.9 C) (05/03 0422) Pulse Rate:  [70-85] 71 (05/03 0835) Resp:  [18-20] 18 (05/03 0422) BP: (153-164)/(78-85) 164/85 (05/03 0835) SpO2:  [93 %-100 %] 97 % (05/03 0422) Weight:  [153.2 kg (337 lb 11.2 oz)] 153.2 kg (337 lb 11.2 oz) (05/03 0422)  Weight change: -2.041 kg (-4 lb 8 oz) Filed Weights   06/25/16 1916 06/26/16 0716 06/27/16 0422  Weight: (!) 155.5 kg (342 lb 12.8 oz) (!) 151.3 kg (333 lb 8 oz) (!) 153.2 kg (337 lb 11.2 oz)    Intake/Output: I/O last 3 completed shifts: In: 480 [P.O.:480] Out: 2250 [Urine:2250]   Intake/Output this shift:  Total I/O In: 240 [P.O.:240] Out: -   Physical Exam: General: NAD, layin g in bed  Head: Normocephalic, atraumatic. Moist oral mucosal membranes  Eyes: Anicteric, PERRL  Neck: Supple, trachea midline  Lungs:  Clear to auscultation  Heart: Regular rate and rhythm  Abdomen:  Soft, nontender,   Extremities: 1+ peripheral edema.  Neurologic: Nonfocal, moving all four extremities  Skin: No lesions       Basic Metabolic Panel:  Recent Labs Lab 06/25/16 1304 06/26/16 0358 06/26/16 1043 06/27/16 0006  NA 137 134*  --  134*  K 3.8 4.1  --  4.3  CL 93* 90*  --  89*  CO2 38* 39*  --  39*  GLUCOSE 211* 362*  --  360*  BUN 27* 32*  --  43*  CREATININE 2.09* 1.95*  --  2.14*  CALCIUM 8.2* 8.2*  --  8.4*  PHOS  --   --  4.0 4.3    Liver Function Tests:  Recent Labs Lab 06/27/16 0006  ALBUMIN 2.7*   No results for input(s): LIPASE, AMYLASE in the last 168 hours. No results for input(s): AMMONIA in the last 168 hours.  CBC:  Recent Labs Lab 06/25/16 1304 06/26/16 0358  WBC 6.3 6.9  NEUTROABS 4.8  --   HGB 9.4* 10.4*  HCT 30.4* 31.9*  MCV 85.4 86.1  PLT 390 387    Cardiac Enzymes:  Recent  Labs Lab 06/25/16 1304 06/26/16 1226 06/26/16 1754 06/27/16 0006  TROPONINI 0.03* 0.03* 0.03* 0.03*    BNP: Invalid input(s): POCBNP  CBG:  Recent Labs Lab 06/26/16 0722 06/26/16 1240 06/26/16 1637 06/26/16 2139 06/27/16 0758  GLUCAP 328* 281* 386* 344* 417*    Microbiology: Results for orders placed or performed during the hospital encounter of 02/23/16  Urine culture     Status: Abnormal   Collection Time: 02/23/16  7:37 PM  Result Value Ref Range Status   Specimen Description URINE, RANDOM  Final   Special Requests NONE  Final   Culture >=100,000 COLONIES/mL KLEBSIELLA PNEUMONIAE (A)  Final   Report Status 02/26/2016 FINAL  Final   Organism ID, Bacteria KLEBSIELLA PNEUMONIAE (A)  Final      Susceptibility   Klebsiella pneumoniae - MIC*    AMPICILLIN 16 RESISTANT Resistant     CEFAZOLIN <=4 SENSITIVE Sensitive     CEFTRIAXONE <=1 SENSITIVE Sensitive     CIPROFLOXACIN <=0.25 SENSITIVE Sensitive     GENTAMICIN <=1 SENSITIVE Sensitive     IMIPENEM <=0.25 SENSITIVE Sensitive     NITROFURANTOIN 32 SENSITIVE Sensitive     TRIMETH/SULFA <=20 SENSITIVE  Sensitive     AMPICILLIN/SULBACTAM <=2 SENSITIVE Sensitive     PIP/TAZO <=4 SENSITIVE Sensitive     Extended ESBL NEGATIVE Sensitive     * >=100,000 COLONIES/mL KLEBSIELLA PNEUMONIAE    Coagulation Studies: No results for input(s): LABPROT, INR in the last 72 hours.  Urinalysis:  Recent Labs  06/26/16 0800  COLORURINE YELLOW*  LABSPEC 1.010  PHURINE 6.0  GLUCOSEU >=500*  HGBUR NEGATIVE  BILIRUBINUR NEGATIVE  KETONESUR NEGATIVE  PROTEINUR 100*  NITRITE NEGATIVE  LEUKOCYTESUR NEGATIVE      Imaging: Dg Chest 1 View  Result Date: 06/25/2016 CLINICAL DATA:  Shortness of breath. EXAM: CHEST 1 VIEW COMPARISON:  03/23/2016. FINDINGS: Cardiomegaly with pulmonary vascular prominence and bilateral interstitial prominence with bilateral pleural effusions consistent with CHF. Interim improvement from prior exam. No  pneumothorax. No acute bony abnormality. IMPRESSION: Interim improvement congestive heart failure. Persistent pulmonary interstitial edema and bilateral pleural effusions remain. Electronically Signed   By: Marcello Moores  Register   On: 06/25/2016 13:37   US Renal  Result Date: 06/26/2016 CLINICAL DATA:  Acute renal failure.  Chronic renal disease. EXAM: RENAL / URINARY TRACT ULTRASOUND COMPLETE COMPARISON:  CT 10/28/2014. FINDINGS: Right Kidney: Length: 12.5 cm. Increased echogenicity consistent chronic medical renal disease. No mass or hydronephrosis visualized. Left Kidney: Length: 12.0 cm. Increased echogenicity consistent chronic medical renal disease. The area of the previously identified nonspecific density noted arising from the medial left kidney on CT of 10/28/2014 is not visualized. No mass or hydronephrosis visualized. Bladder: Not visualized. IMPRESSION: 1. Bilateral increased renal echogenicity consistent with chronic medical renal disease. 2. The area of the previously identified nonspecific density noted arising from the medial left kidney on CT of 10/28/2014 is not visualized. No focal renal lesion identified. No hydronephrosis. Bladder nondistended. Electronically Signed   By: Marcello Moores  Register   On: 06/26/2016 11:52   Dg Knee Complete 4 Views Right  Result Date: 06/25/2016 CLINICAL DATA:  46 year old female status post fall this morning down a step with right knee injury. Pain. Initial encounter. EXAM: RIGHT KNEE - COMPLETE 4+ VIEW COMPARISON:  None. FINDINGS: Evidence of widespread subcutaneous edema in the right lower extremity. Calcified peripheral vascular disease in the distal right femoral and popliteal artery is advanced for age. There is possibly a healed deformity of the right fibula proximal metadiaphysis, but no acute fracture or dislocation identified about the left knee. Possible small joint effusion. IMPRESSION: 1. No acute fracture or dislocation identified about the right knee.  Possible small joint effusion. 2. Calcified peripheral vascular disease and possibly widespread soft tissue edema in the right lower extremity. Electronically Signed   By: Genevie Ann M.D.   On: 06/25/2016 14:29     Medications:   . sodium chloride     . amLODipine  10 mg Oral Daily  . aspirin EC  81 mg Oral Daily  . atorvastatin  20 mg Oral q1800  . budesonide  0.25 mg Nebulization BID  . citalopram  40 mg Oral Daily  . docusate sodium  100 mg Oral BID  . furosemide  60 mg Intravenous BID  . heparin  5,000 Units Subcutaneous Q8H  . hydrALAZINE  100 mg Oral Q8H  . insulin aspart  0-15 Units Subcutaneous TID WC  . insulin aspart  0-5 Units Subcutaneous QHS  . insulin aspart  11 Units Subcutaneous TID WC  . insulin glargine  50 Units Subcutaneous QHS  . methylPREDNISolone (SOLU-MEDROL) injection  60 mg Intravenous BID  . metolazone  5 mg Oral Daily  . metoprolol  50 mg Oral BID  . potassium chloride  10 mEq Oral BID  . sodium chloride flush  3 mL Intravenous Q12H   sodium chloride, acetaminophen **OR** acetaminophen, albuterol, guaiFENesin-dextromethorphan, hydrALAZINE, HYDROcodone-acetaminophen, ondansetron **OR** ondansetron (ZOFRAN) IV, polyethylene glycol, sodium chloride flush  Assessment/ Plan:  [name] is a 46 y.o. black female with congestive heart failure, COPD/asthma, hypertension, diabetes mellitus type II, anemia, anxeity, who was admitted to [facility] on 06/25/2016 for Acute renal failure, unspecified acute renal failure type   1. Acute renal failure on chronic kidney disease stage III with proteinuria: Baseline creatinine of 1.62, GFR of 44 on 04/29/2016. More than 5 grams proteinuria Chronic kidney disease secondary to diabetic nephropathy, hypertension Acute renal failure from acute exacerbation of congestive heart failure with acute cardiorenal syndrome. No acute indication for dialysis.  Ultrasound reviewed with patient.  - Holding losartan - Renally dose all  medications.  - Check renal ultrasound - Pending SPEP/UPEP   2. Hypertension/ acute exacerbation of congestive heart failure: holding losartan  Echocardiogram results pending - IV furosemide 60mg  bid and metolazone 5mg  daily PO - amlodipine, metoprolol.   3. Diabetes mellitus type II with chronic kidney disease: uncontrolled. Hemoglobin A1c 8.2%  4. Anemia with chronic kidney disease: Hemoglobin 10.4  5. Secondary Hyperparathyroidism: PTH 134. Calcium and phosphorus at goal. Not currently on a vitamin D agent  LOS: 2 [name] 5/3/201810:19 AM

## 2016-06-27 NOTE — Progress Notes (Signed)
*  PRELIMINARY RESULTS* Echocardiogram 2D Echocardiogram has been performed.  Sherrie Sport 06/27/2016, 8:44 AM

## 2016-06-28 LAB — GLUCOSE, CAPILLARY
Glucose-Capillary: 188 mg/dL — ABNORMAL HIGH (ref 65–99)
Glucose-Capillary: 209 mg/dL — ABNORMAL HIGH (ref 65–99)
Glucose-Capillary: 265 mg/dL — ABNORMAL HIGH (ref 65–99)
Glucose-Capillary: 278 mg/dL — ABNORMAL HIGH (ref 65–99)

## 2016-06-28 LAB — BASIC METABOLIC PANEL
Anion gap: 9 (ref 5–15)
BUN: 51 mg/dL — AB (ref 6–20)
CALCIUM: 8.5 mg/dL — AB (ref 8.9–10.3)
CO2: 38 mmol/L — AB (ref 22–32)
CREATININE: 2.07 mg/dL — AB (ref 0.44–1.00)
Chloride: 88 mmol/L — ABNORMAL LOW (ref 101–111)
GFR calc Af Amer: 32 mL/min — ABNORMAL LOW (ref >60)
GFR calc non Af Amer: 28 mL/min — ABNORMAL LOW (ref >60)
GLUCOSE: 167 mg/dL — AB (ref 65–99)
Potassium: 4.2 mmol/L (ref 3.5–5.1)
Sodium: 135 mmol/L (ref 135–145)

## 2016-06-28 LAB — HEMOGLOBIN A1C
Hgb A1c MFr Bld: 8.5 % — ABNORMAL HIGH (ref 4.8–5.6)
MEAN PLASMA GLUCOSE: 197 mg/dL

## 2016-06-28 MED ORDER — TORSEMIDE 100 MG PO TABS
100.0000 mg | ORAL_TABLET | Freq: Two times a day (BID) | ORAL | Status: DC
  Administered 2016-06-28 – 2016-06-29 (×2): 100 mg via ORAL
  Filled 2016-06-28 (×2): qty 1

## 2016-06-28 MED ORDER — ALBUTEROL SULFATE (2.5 MG/3ML) 0.083% IN NEBU
2.5000 mg | INHALATION_SOLUTION | Freq: Four times a day (QID) | RESPIRATORY_TRACT | Status: DC
  Administered 2016-06-29 (×3): 2.5 mg via RESPIRATORY_TRACT
  Filled 2016-06-28 (×4): qty 3

## 2016-06-28 MED ORDER — BUDESONIDE 0.25 MG/2ML IN SUSP
0.2500 mg | Freq: Two times a day (BID) | RESPIRATORY_TRACT | Status: DC
  Administered 2016-06-29: 0.25 mg via RESPIRATORY_TRACT
  Filled 2016-06-28: qty 2

## 2016-06-28 NOTE — Progress Notes (Signed)
[facility]  ROUNDING NOTE   Subjective:   Sister at bedside [name] 4 Litres   Continues to complain of shortness of breath, wheezing and peripheral edema  Objective:  Vital signs in last 24 hours:  Temp:  [98.1 F (36.7 C)-99 F (37.2 C)] 98.6 F (37 C) (05/04 0905) Pulse Rate:  [70-77] 77 (05/04 0905) Resp:  [16-20] 18 (05/04 0905) BP: (147-170)/(71-87) 149/75 (05/04 0905) SpO2:  [91 %-97 %] 91 % (05/04 0905) Weight:  [154.7 kg (341 lb 1.6 oz)] 154.7 kg (341 lb 1.6 oz) (05/04 0528)  Weight change: 3.447 kg (7 lb 9.6 oz) Filed Weights   06/26/16 0716 06/27/16 0422 06/28/16 0528  Weight: (!) 151.3 kg (333 lb 8 oz) (!) 153.2 kg (337 lb 11.2 oz) (!) 154.7 kg (341 lb 1.6 oz)    Intake/Output: I/O last 3 completed shifts: In: 240 [P.O.:240] Out: 1700 [Urine:1700]   Intake/Output this shift:  No intake/output data recorded.  Physical Exam: General: NAD, sitting up in bed  Head: Normocephalic, atraumatic. Moist oral mucosal membranes  Eyes: Anicteric, PERRL  Neck: Supple, trachea midline  Lungs:  Diminished bilaterally, crackles, wheezes  Heart: Regular rate and rhythm  Abdomen:  Soft, nontender, obese  Extremities: 1+ peripheral edema.  Neurologic: Nonfocal, moving all four extremities  Skin: No lesions       Basic Metabolic Panel:  Recent Labs Lab 06/25/16 1304 06/26/16 0358 06/26/16 1043 06/27/16 0006 06/28/16 0620  NA 137 134*  --  134* 135  K 3.8 4.1  --  4.3 4.2  CL 93* 90*  --  89* 88*  CO2 38* 39*  --  39* 38*  GLUCOSE 211* 362*  --  360* 167*  BUN 27* 32*  --  43* 51*  CREATININE 2.09* 1.95*  --  2.14* 2.07*  CALCIUM 8.2* 8.2*  --  8.4* 8.5*  PHOS  --   --  4.0 4.3  --     Liver Function Tests:  Recent Labs Lab 06/27/16 0006  ALBUMIN 2.7*   No results for input(s): LIPASE, AMYLASE in the last 168 hours. No results for input(s): AMMONIA in the last 168 hours.  CBC:  Recent Labs Lab 06/25/16 1304 06/26/16 0358  WBC 6.3  6.9  NEUTROABS 4.8  --   HGB 9.4* 10.4*  HCT 30.4* 31.9*  MCV 85.4 86.1  PLT 390 387    Cardiac Enzymes:  Recent Labs Lab 06/25/16 1304 06/26/16 1226 06/26/16 1754 06/27/16 0006  TROPONINI 0.03* 0.03* 0.03* 0.03*    BNP: Invalid input(s): POCBNP  CBG:  Recent Labs Lab 06/27/16 0758 06/27/16 1146 06/27/16 1643 06/27/16 2050 06/28/16 0813  GLUCAP 417* 372* 340* 268* 188*    Microbiology: Results for orders placed or performed during the hospital encounter of 02/23/16  Urine culture     Status: Abnormal   Collection Time: 02/23/16  7:37 PM  Result Value Ref Range Status   Specimen Description URINE, RANDOM  Final   Special Requests NONE  Final   Culture >=100,000 COLONIES/mL KLEBSIELLA PNEUMONIAE (A)  Final   Report Status 02/26/2016 FINAL  Final   Organism ID, Bacteria KLEBSIELLA PNEUMONIAE (A)  Final      Susceptibility   Klebsiella pneumoniae - MIC*    AMPICILLIN 16 RESISTANT Resistant     CEFAZOLIN <=4 SENSITIVE Sensitive     CEFTRIAXONE <=1 SENSITIVE Sensitive     CIPROFLOXACIN <=0.25 SENSITIVE Sensitive     GENTAMICIN <=1 SENSITIVE Sensitive     IMIPENEM <=  0.25 SENSITIVE Sensitive     NITROFURANTOIN 32 SENSITIVE Sensitive     TRIMETH/SULFA <=20 SENSITIVE Sensitive     AMPICILLIN/SULBACTAM <=2 SENSITIVE Sensitive     PIP/TAZO <=4 SENSITIVE Sensitive     Extended ESBL NEGATIVE Sensitive     * >=100,000 COLONIES/mL KLEBSIELLA PNEUMONIAE    Coagulation Studies: No results for input(s): LABPROT, INR in the last 72 hours.  Urinalysis:  Recent Labs  06/26/16 0800  COLORURINE YELLOW*  LABSPEC 1.010  PHURINE 6.0  GLUCOSEU >=500*  HGBUR NEGATIVE  BILIRUBINUR NEGATIVE  KETONESUR NEGATIVE  PROTEINUR 100*  NITRITE NEGATIVE  LEUKOCYTESUR NEGATIVE      Imaging: US Renal  Result Date: 06/26/2016 CLINICAL DATA:  Acute renal failure.  Chronic renal disease. EXAM: RENAL / URINARY TRACT ULTRASOUND COMPLETE COMPARISON:  CT 10/28/2014. FINDINGS: Right  Kidney: Length: 12.5 cm. Increased echogenicity consistent chronic medical renal disease. No mass or hydronephrosis visualized. Left Kidney: Length: 12.0 cm. Increased echogenicity consistent chronic medical renal disease. The area of the previously identified nonspecific density noted arising from the medial left kidney on CT of 10/28/2014 is not visualized. No mass or hydronephrosis visualized. Bladder: Not visualized. IMPRESSION: 1. Bilateral increased renal echogenicity consistent with chronic medical renal disease. 2. The area of the previously identified nonspecific density noted arising from the medial left kidney on CT of 10/28/2014 is not visualized. No focal renal lesion identified. No hydronephrosis. Bladder nondistended. Electronically Signed   By: [place]   On: 06/26/2016 11:52     Medications:   . sodium chloride     . amLODipine  10 mg Oral Daily  . aspirin EC  81 mg Oral Daily  . atorvastatin  20 mg Oral q1800  . budesonide  0.25 mg Nebulization BID  . citalopram  40 mg Oral Daily  . docusate sodium  100 mg Oral BID  . heparin  5,000 Units Subcutaneous Q8H  . hydrALAZINE  100 mg Oral Q8H  . insulin aspart  0-15 Units Subcutaneous TID WC  . insulin aspart  0-5 Units Subcutaneous QHS  . insulin aspart  11 Units Subcutaneous TID WC  . insulin glargine  50 Units Subcutaneous QHS  . methylPREDNISolone (SOLU-MEDROL) injection  60 mg Intravenous BID  . metoprolol  75 mg Oral BID  . potassium chloride  10 mEq Oral BID  . sodium chloride flush  3 mL Intravenous Q12H  . torsemide  100 mg Oral BID   sodium chloride, acetaminophen **OR** acetaminophen, albuterol, guaiFENesin-dextromethorphan, hydrALAZINE, HYDROcodone-acetaminophen, ondansetron **OR** ondansetron (ZOFRAN) IV, polyethylene glycol, sodium chloride flush  Assessment/ Plan:  [name] is a 46 y.o. black female with congestive heart failure, COPD/asthma, hypertension, diabetes mellitus type II, anemia,  anxeity, who was admitted to [facility] on 06/25/2016 for Acute renal failure, unspecified acute renal failure type   1. Acute renal failure on chronic kidney disease stage III with proteinuria: Baseline creatinine of 1.62, GFR of 44 on 04/29/2016. More than 5 grams proteinuria.  Chronic kidney disease secondary to diabetic nephropathy, hypertension Acute renal failure from acute exacerbation of congestive heart failure with acute cardiorenal syndrome. No acute indication for dialysis.  SPEP/UPEP negative. Ultrasound with chronic kidney disease - Holding losartan - Renally dose all medications.   2. Hypertension/ acute exacerbation of diastolic congestive heart failure/edema/anasarca: holding losartan  Echocardiogram results reviewed with patient  - Increase torsemide to 100mg  bid - start fluid restriction - amlodipine, metoprolol.   3. Diabetes mellitus type II with chronic kidney disease: uncontrolled.  Hemoglobin A1c 8.2%  4. Anemia with chronic kidney disease: Hemoglobin 10.4  5. Secondary Hyperparathyroidism: PTH 134. Calcium and phosphorus at goal. Not currently on a vitamin D agent  Consult PT for ambulation.    LOS: 3 Neilani Duffee 5/4/20189:40 AM

## 2016-06-28 NOTE — Care Management (Signed)
Patient at present time does not feel she needs to have a home health nurse come to see her. She asks why the pulmonary consult has not been performed.  Reached out to attending as there is no order present.

## 2016-06-28 NOTE — Evaluation (Signed)
Physical Therapy Evaluation Patient Details Name: [name] MRN: [id number] DOB: Jan 05, 1971 Today's Date: 06/28/2016   History of Present Illness   46 y.o. female with a known history of Diastolic congestive heart failure, COPD, chronic respiratory failure on 3-4 L oxygen, CKD stage III, morbid obesity, gait abnormalities presents to the emergency room with lower extremity edema, shortness of breath, cough, weakness and fall.  Clinical Impression  Pt generally showed good effort with PT exam but was very limited secondary to fatigue and anxiety.  She was on 4 liters of O2 and essentially struggled to maintain sats in the 90s and dropped as low as 81% with very limited side shuffling along EOB.  Pt does not want to go to rehab and is eager to work with PT at home, but knows she has been relatively limited with activity for a long time and has some work ahead of her to get back to a more functional level (pt talked about trying to get back in shape enough to try and go to [place] again).  Pt did relatively well with bed mobility and sitting at EOB, but her O2 was an issue the entire session.     Follow Up Recommendations Home health PT    Equipment Recommendations   (pt interested in w/c, could be benificial per act tolerance)    Recommendations for Other Services       Precautions / Restrictions Precautions Precautions: Fall Restrictions Weight Bearing Restrictions: No      Mobility  Bed Mobility Overal bed mobility: Modified Independent             General bed mobility comments: Pt slow and labored to get to sitting, but able to do so w/o assist, needed heavy self assist with UEs to get legs back into bed post session  Transfers Overall transfer level: Modified independent Equipment used: Rolling walker (2 wheeled)             General transfer comment: Pt was able to rise to standing and maintain balance with walker w/o assist.  Ambulation/Gait              General Gait Details: No real ambulation, however she was able to take a few small side shuffle steps along EOB but became very fatigued with this and needed to sit.  Too tired to try another attempt, O2 only very slowly increased back to upper 80s (from as low as 81%) on 4 liters post effort  Stairs            Wheelchair Mobility    Modified Rankin (Stroke Patients Only)       Balance Overall balance assessment: Modified Independent                                           Pertinent Vitals/Pain Pain Assessment: No/denies pain    Home Living Family/patient expects to be discharged to:: Private residence Living Arrangements: Spouse/significant other;Children Available Help at Discharge: Family;Personal care attendant (aide 4d/wk)   Home Access: Stairs to enter Entrance Stairs-Rails: None Entrance Stairs-Number of Steps: 4 Home Layout: One level Home Equipment: Walker - 4 wheels      Prior Function Level of Independence: Independent with assistive device(s)         Comments: minimal household ambulator from bedroom->bathroom, only out of the home for Dr appts     [place]  Dominance        Extremity/Trunk Assessment   Upper Extremity Assessment Upper Extremity Assessment: Overall WFL for tasks assessed;Generalized weakness    Lower Extremity Assessment Lower Extremity Assessment: Overall WFL for tasks assessed;Generalized weakness       Communication   Communication: No difficulties  Cognition Arousal/Alertness: Awake/alert Behavior During Therapy: Anxious Overall Cognitive Status: Within Functional Limits for tasks assessed                                        General Comments      Exercises     Assessment/Plan    PT Assessment Patient needs continued PT services  PT Problem List Decreased strength;Decreased range of motion;Decreased activity tolerance;Decreased mobility;Decreased safety  awareness;Obesity;Cardiopulmonary status limiting activity       PT Treatment Interventions DME instruction;Gait training;Stair training;Functional mobility training;Therapeutic activities;Therapeutic exercise;Balance training;Neuromuscular re-education;Patient/family education    PT Goals (Current goals can be found in the Care Plan section)  Acute Rehab PT Goals Patient Stated Goal: go home, I'm not going to rehab PT Goal Formulation: With patient Time For Goal Achievement: 07/12/16 Potential to Achieve Goals: Fair    Frequency Min 2X/week   Barriers to discharge        Co-evaluation               AM-PAC PT "6 Clicks" Daily Activity  Outcome Measure Difficulty turning over in bed (including adjusting bedclothes, sheets and blankets)?: A Little Difficulty moving from lying on back to sitting on the side of the bed? : A Little Difficulty sitting down on and standing up from a chair with arms (e.g., wheelchair, bedside commode, etc,.)?: A Little Help needed moving to and from a bed to chair (including a wheelchair)?: A Lot Help needed walking in hospital room?: A Lot Help needed climbing 3-5 steps with a railing? : A Lot 6 Click Score: 15    End of Session Equipment Utilized During Treatment: Oxygen;Gait belt (4 liters, sats ~90 at rest (high of 93%), low 80s with acts) Activity Tolerance: Patient limited by fatigue Patient left: with bed alarm set;with call bell/phone within reach   PT Visit Diagnosis: Muscle weakness (generalized) (M62.81);Difficulty in walking, not elsewhere classified (R26.2)    Time: 1660-6004 PT Time Calculation (min) (ACUTE ONLY): 29 min   Charges:   PT Evaluation $PT Eval Low Complexity: 1 Procedure     PT G Codes:        Kreg Shropshire, DPT 06/28/2016, 5:20 PM

## 2016-06-28 NOTE — Progress Notes (Signed)
[place] [facility] [place] NAME: [name]    MR#:  [id number]  DATE OF BIRTH:  [birth year]  SUBJECTIVE:  CHIEF COMPLAINT:   Chief Complaint  Patient presents with  . Weakness     Came with increasing swelling on legs and SOB with hypoxia, not taking meds regularly.    Also have some worsening in renal function.   Have diuresis with lasix, but not very much.   Nephrologist increased torsemide dose.  REVIEW OF SYSTEMS:   Constitutional: Positive for malaise/fatigue. Negative for chills and fever.  HENT: Negative for sore throat.   Eyes: Negative for blurred vision, double vision and pain.  Respiratory: Positive for cough and shortness of breath. Negative for hemoptysis and wheezing.   Cardiovascular: Positive for orthopnea and leg swelling. Negative for chest pain and palpitations.  Gastrointestinal: Negative for abdominal pain, constipation, diarrhea, heartburn, nausea and vomiting.  Genitourinary: Negative for dysuria and hematuria.  Musculoskeletal: Positive for back pain and falls. Negative for joint pain.  Skin: Negative for rash.  Neurological: Positive for weakness. Negative for sensory change, speech change, focal weakness and headaches.  Endo/Heme/Allergies: Does not bruise/bleed easily.  Psychiatric/Behavioral: Negative for depression. The patient is not nervous/anxious  ROS  DRUG ALLERGIES:   Allergies  Allergen Reactions  . Feraheme [Ferumoxytol] Itching  . Sulfa Antibiotics Itching    VITALS:  Blood pressure (!) 146/65, pulse 78, temperature 99.2 F (37.3 C), temperature source Oral, resp. rate 17, height 5\' 4"  (1.626 m), weight (!) 154.7 kg (341 lb 1.6 oz), last menstrual period 03/28/2016, SpO2 100 %.  PHYSICAL EXAMINATION:   GENERAL:  46 y.o.-year-old patient lying in the bed , Conversational dyspnea. Morbidly obese EYES: Pupils equal, round, reactive to light and accommodation. No scleral icterus. Extraocular  muscles intact.  HEENT: Head atraumatic, normocephalic. Oropharynx and nasopharynx clear. No oropharyngeal erythema, moist oral mucosa  NECK:  Supple, no jugular venous distention. No thyroid enlargement, no tenderness.  LUNGS: Bilateral wheezing with decreased air entry CARDIOVASCULAR: S1, S2 normal. No murmurs, rubs, or gallops.  ABDOMEN: Soft, nontender, nondistended. Bowel sounds present. No organomegaly or mass.  EXTREMITIES: No cyanosis, or clubbing. + 2 pedal & radial pulses b/l.  Bilateral lower extremity edema up to her thighs NEUROLOGIC: Cranial nerves II through XII are intact. No focal Motor or sensory deficits appreciated b/l PSYCHIATRIC: The patient is alert and oriented x 3. Good affect.  SKIN: No obvious rash, lesion, or ulcer.   Physical Exam LABORATORY PANEL:   CBC  Recent Labs Lab 06/26/16 0358  WBC 6.9  HGB 10.4*  HCT 31.9*  PLT 387   ------------------------------------------------------------------------------------------------------------------  Chemistries   Recent Labs Lab 06/28/16 0620  NA 135  K 4.2  CL 88*  CO2 38*  GLUCOSE 167*  BUN 51*  CREATININE 2.07*  CALCIUM 8.5*   ------------------------------------------------------------------------------------------------------------------  Cardiac Enzymes  Recent Labs Lab 06/26/16 1754 06/27/16 0006  TROPONINI 0.03* 0.03*   ------------------------------------------------------------------------------------------------------------------  RADIOLOGY:  No results found.  ASSESSMENT AND PLAN:   Active Problems:   Hypertensive heart disease with congestive heart failure (HCC)   Acute on chronic diastolic CHF (congestive heart failure) (HCC)   Morbid obesity (HCC)   Iron deficiency anemia   Poorly controlled type 2 diabetes mellitus (HCC)   Asthma   COPD exacerbation (HCC)   Acute on chronic respiratory failure (HCC)   CKD (chronic kidney disease), stage III  * Acute on chronic  respiratory failure due to combination of  congestive heart failure and COPD. Nebulizers when necessary. Wean oxygen as tolerated. Present on 5 L nasal cannula. Normally on 3-4 L at home.  * Acute on chronic diastolic congestive heart failure - IV Lasix, Beta blockers - Input and Output - Counseled to limit fluids and Salt - Monitor Bun/Cr and Potassium - Echo - reviewed. reorder -Cardiology consult appreciated. - increased torsemide dose. - her albumin level is also low.  * Acute COPD exacerbation. -IV steroids - Scheduled Nebulizers - Inhalers -Wean O2 as tolerated  * Acute renal failure on CKD stage III. Likely cardiorenal syndrome. Continue Lasix. Consult nephrology. Monitor input and output. Daily weight.   Renal sono without obstructions.  * Insulin dependent diabetes mellitus. Continue patient's Levemir. Sliding scale insulin. Will likely be uncontrolled due to IV steroid use.  * DVT prophylaxis with heparin    All the records are reviewed and case discussed with Care Management/Social Workerr. Management plans discussed with the patient, family and they are in agreement.  CODE STATUS: full  TOTAL TIME TAKING CARE OF THIS PATIENT: 35 minutes.    POSSIBLE D/C IN 1-2 DAYS, DEPENDING ON CLINICAL CONDITION.   Vaughan Basta M.D on 06/28/2016   Between 7am to 6pm - Pager - [phone]  After 6pm go to [email or web address] - password [email or web address] [facility]  Office  [phone]  CC: Primary care physician; [name], NP  Note: This dictation was prepared with Dragon dictation along with smaller phrase technology. Any transcriptional errors that result from this process are unintentional.

## 2016-06-28 NOTE — Progress Notes (Signed)
Progress Note  Patient Name: [name] Date of Encounter: 06/28/2016  Primary Cardiologist: Johnny Bridge, MD   Subjective   Breathing slightly better but still not @ baseline.  Coughing up green phlegm.  Inpatient Medications    Scheduled Meds: . amLODipine  10 mg Oral Daily  . aspirin EC  81 mg Oral Daily  . atorvastatin  20 mg Oral q1800  . budesonide  0.25 mg Nebulization BID  . citalopram  40 mg Oral Daily  . docusate sodium  100 mg Oral BID  . heparin  5,000 Units Subcutaneous Q8H  . hydrALAZINE  100 mg Oral Q8H  . insulin aspart  0-15 Units Subcutaneous TID WC  . insulin aspart  0-5 Units Subcutaneous QHS  . insulin aspart  11 Units Subcutaneous TID WC  . insulin glargine  50 Units Subcutaneous QHS  . methylPREDNISolone (SOLU-MEDROL) injection  60 mg Intravenous BID  . metoprolol  75 mg Oral BID  . potassium chloride  10 mEq Oral BID  . sodium chloride flush  3 mL Intravenous Q12H  . torsemide  20 mg Oral BID   Continuous Infusions: . sodium chloride     PRN Meds: sodium chloride, acetaminophen **OR** acetaminophen, albuterol, guaiFENesin-dextromethorphan, hydrALAZINE, HYDROcodone-acetaminophen, ondansetron **OR** ondansetron (ZOFRAN) IV, polyethylene glycol, sodium chloride flush   Vital Signs    Vitals:   06/27/16 1950 06/27/16 2021 06/27/16 2315 06/28/16 0528  BP: (!) 170/82  (!) 147/71 (!) 154/77  Pulse: 77  70 70  Resp: 20   16  Temp: 99 F (37.2 C)   98.3 F (36.8 C)  TempSrc: Oral   Oral  SpO2: 93% 93% 91% 94%  Weight:    (!) 341 lb 1.6 oz (154.7 kg)  Height:        Intake/Output Summary (Last 24 hours) at 06/28/16 0801 Last data filed at 06/28/16 0528  Gross per 24 hour  Intake              240 ml  Output             1100 ml  Net             -860 ml   Filed Weights   06/26/16 0716 06/27/16 0422 06/28/16 0528  Weight: (!) 333 lb 8 oz (151.3 kg) (!) 337 lb 11.2 oz (153.2 kg) (!) 341 lb 1.6 oz (154.7 kg)    Physical Exam   GEN: Well  nourished, well developed, in no acute distress.  HEENT: Grossly normal.  Neck: Supple, obese - unable to gauge JVP, no carotid bruits, or masses. Cardiac: RRR, distant, no murmurs, rubs, or gallops. No clubbing, cyanosis, trace nonpitting LE edema.  Radials/DP/PT 2+ and equal bilaterally.  Respiratory:  Respirations regular and unlabored, scattered rhonchi and anterior exp wheezing. GI: Soft, nontender, nondistended, BS + x 4. MS: no deformity or atrophy. Skin: warm and dry, no rash. Neuro:  Strength and sensation are intact. Psych: AAOx3.  Normal affect.  Labs    Chemistry Recent Labs Lab 06/25/16 1304 06/26/16 0358 06/27/16 0006  NA 137 134* 134*  K 3.8 4.1 4.3  CL 93* 90* 89*  CO2 38* 39* 39*  GLUCOSE 211* 362* 360*  BUN 27* 32* 43*  CREATININE 2.09* 1.95* 2.14*  CALCIUM 8.2* 8.2* 8.4*  ALBUMIN  --   --  2.7*  GFRNONAA 28* 30* 27*  GFRAA 32* 35* 31*  ANIONGAP 6 5 6      Hematology Recent Labs Lab 06/25/16  1304 06/26/16 0358  WBC 6.3 6.9  RBC 3.56* 3.70*  HGB 9.4* 10.4*  HCT 30.4* 31.9*  MCV 85.4 86.1  MCH 26.5 28.0  MCHC 31.0* 32.5  RDW 16.2* 15.8*  PLT 390 387    Cardiac Enzymes Recent Labs Lab 06/25/16 1304 06/26/16 1226 06/26/16 1754 06/27/16 0006  TROPONINI 0.03* 0.03* 0.03* 0.03*    BNP Recent Labs Lab 06/25/16 1304  BNP 475.0*      Radiology    US Renal  Result Date: 06/26/2016 CLINICAL DATA:  Acute renal failure.  Chronic renal disease. EXAM: RENAL / URINARY TRACT ULTRASOUND COMPLETE COMPARISON:  CT 10/28/2014. FINDINGS: Right Kidney: Length: 12.5 cm. Increased echogenicity consistent chronic medical renal disease. No mass or hydronephrosis visualized. Left Kidney: Length: 12.0 cm. Increased echogenicity consistent chronic medical renal disease. The area of the previously identified nonspecific density noted arising from the medial left kidney on CT of 10/28/2014 is not visualized. No mass or hydronephrosis visualized. Bladder: Not  visualized. IMPRESSION: 1. Bilateral increased renal echogenicity consistent with chronic medical renal disease. 2. The area of the previously identified nonspecific density noted arising from the medial left kidney on CT of 10/28/2014 is not visualized. No focal renal lesion identified. No hydronephrosis. Bladder nondistended. Electronically Signed   By: [place]   On: 06/26/2016 11:52    Telemetry    RSR - Personally Reviewed  Cardiac Studies   2D Echocardiogram 5.3.2018  Study Conclusions  - Procedure narrative: Transthoracic echocardiography. Image   quality was suboptimal. The study was technically difficult. - Left ventricle: The cavity size was normal. There was moderate   concentric hypertrophy. Systolic function was normal. The   estimated ejection fraction was in the range of 60% to 65%. Wall   motion was normal; there were no regional wall motion   abnormalities. The study is not technically sufficient to allow   evaluation of LV diastolic function. - Left atrium: The atrium was moderately dilated. - Right ventricle: Systolic function was normal. - Pulmonary arteries: Systolic pressure could not be accurately   estimated. - Pericardium, extracardiac: A trivial pericardial effusion was   identified.  Patient Profile     46 y.o. female with history of chronic diastolic CHF, chronic respiratory failure on home oxygen at 3-4 L secondary to COPD, CKD stage III, anemia in the setting of heavy menses requiring iron infusions, asthma, morbid obesity, and medication noncompliance who presented to [facility] on 5[facility]1 with weakness and was found to have acute on chronic respiratory failure 2/2 AECOPD and acute on chronic diastolic CHF 2/2 medication noncompliance.  Assessment & Plan    1.  Acute on chronic resp failure w/ hypoxia/AECOPD: Multifactorial in the setting of AECOPD, IDA, obesity/OHS, and mild volume overload.  Volume difficult to gauge 2/2 habitus though I/O negative.   Wts climbing but today's was the first on a standing scale so ? Accuracy of previous wts.  Still with rhonchi, cough w/ green phlegm, and anterior wheezing.  Steroids, inhalers per IM.  2.  Acute on chronic HFpEF:  Minus .0.8L yesterday.  Minus 2.6L for admission.  Wt listed as up further to 341 lbs (337 lbs recorded yesterday) - tough bedscale used yesterday and today she stood.  Switched from IV lasix to oral torsemide yesterday in light of rising creat.  No BMET ordered for this am - will order now. Exam is difficult 2/2 body habitus.  Only trace nonpitting ankle edema.  HR stable.  BP elevated.  Home dose  of losartan on hold in setting of rising creat - hopefully can resume if renal fxn stable.  Once copd flare dealt with, perhaps we can switch from metoprolol to carvedilol or labetalol if bp still high despite adding back ARB (assuming we will be able to add it back).  3.  Hypertensive Heart Dzs:  See #2.    4.  Elevated troponin:  Peak 0.03.  EF nl on echo.  Likely demand ischemia in setting of resp failure and HTN.  No plan for ischemic eval @ this time.  Cont asa, statin,  blocker.  5.  Acute on CKD III:  f/u this am.  Nephrology following.  6.  Morbid obesity/OHS:  Will need ongoing nutritional services eval and mgmt.  Plan on outpt sleep eval.  Signed, Murray Hodgkins, NP  06/28/2016, 8:01 AM

## 2016-06-29 DIAGNOSIS — J441 Chronic obstructive pulmonary disease with (acute) exacerbation: Secondary | ICD-10-CM

## 2016-06-29 LAB — CBC
HCT: 33.4 % — ABNORMAL LOW (ref 35.0–47.0)
Hemoglobin: 10.4 g/dL — ABNORMAL LOW (ref 12.0–16.0)
MCH: 26.5 pg (ref 26.0–34.0)
MCHC: 31.1 g/dL — AB (ref 32.0–36.0)
MCV: 85.4 fL (ref 80.0–100.0)
Platelets: 499 10*3/uL — ABNORMAL HIGH (ref 150–440)
RBC: 3.92 MIL/uL (ref 3.80–5.20)
RDW: 15.9 % — AB (ref 11.5–14.5)
WBC: 8 10*3/uL (ref 3.6–11.0)

## 2016-06-29 LAB — BASIC METABOLIC PANEL
ANION GAP: 7 (ref 5–15)
BUN: 61 mg/dL — AB (ref 6–20)
CALCIUM: 8.2 mg/dL — AB (ref 8.9–10.3)
CO2: 39 mmol/L — ABNORMAL HIGH (ref 22–32)
Chloride: 88 mmol/L — ABNORMAL LOW (ref 101–111)
Creatinine, Ser: 1.9 mg/dL — ABNORMAL HIGH (ref 0.44–1.00)
GFR calc Af Amer: 36 mL/min — ABNORMAL LOW (ref >60)
GFR, EST NON AFRICAN AMERICAN: 31 mL/min — AB (ref >60)
GLUCOSE: 297 mg/dL — AB (ref 65–99)
Potassium: 4.2 mmol/L (ref 3.5–5.1)
SODIUM: 134 mmol/L — AB (ref 135–145)

## 2016-06-29 LAB — GLUCOSE, CAPILLARY
GLUCOSE-CAPILLARY: 252 mg/dL — AB (ref 65–99)
GLUCOSE-CAPILLARY: 184 mg/dL — AB (ref 65–99)

## 2016-06-29 MED ORDER — CEFTRIAXONE SODIUM 1 G IJ SOLR
1.0000 g | INTRAMUSCULAR | Status: DC
  Administered 2016-06-29: 1 g via INTRAVENOUS
  Filled 2016-06-29 (×2): qty 10

## 2016-06-29 MED ORDER — METOPROLOL TARTRATE 75 MG PO TABS: 75.0000 mg | ORAL_TABLET | Freq: Two times a day (BID) | ORAL | 0 refills | Status: DC

## 2016-06-29 MED ORDER — TORSEMIDE 20 MG PO TABS: 20.0000 mg | ORAL_TABLET | Freq: Every day | ORAL | Status: DC

## 2016-06-29 MED ORDER — TORSEMIDE 20 MG PO TABS: 20.0000 mg | ORAL_TABLET | Freq: Every day | ORAL | 0 refills | Status: DC

## 2016-06-29 MED ORDER — AZITHROMYCIN 250 MG PO TABS: 250.0000 mg | ORAL_TABLET | Freq: Every day | ORAL | 0 refills | Status: AC

## 2016-06-29 MED ORDER — AZITHROMYCIN 250 MG PO TABS
250.0000 mg | ORAL_TABLET | Freq: Every day | ORAL | Status: DC
  Administered 2016-06-29: 250 mg via ORAL
  Filled 2016-06-29: qty 1

## 2016-06-29 MED ORDER — DEXTROSE 5 % IV SOLN: 250.0000 mg | INTRAVENOUS | Status: DC

## 2016-06-29 MED ORDER — CEFUROXIME AXETIL 250 MG PO TABS: 250.0000 mg | ORAL_TABLET | Freq: Two times a day (BID) | ORAL | 0 refills | Status: AC

## 2016-06-29 MED ORDER — POLYETHYLENE GLYCOL 3350 17 G PO PACK: 17.0000 g | PACK | Freq: Every day | ORAL | 0 refills | Status: DC | PRN

## 2016-06-29 MED ORDER — DEXTROSE 5 % IV SOLN: 1.0000 g | INTRAVENOUS | Status: DC

## 2016-06-29 MED ORDER — PREDNISONE 10 MG (21) PO TBPK: ORAL_TABLET | ORAL | 0 refills | Status: DC

## 2016-06-29 MED ORDER — ATORVASTATIN CALCIUM 20 MG PO TABS: 20.0000 mg | ORAL_TABLET | Freq: Every day | ORAL | 0 refills | Status: DC

## 2016-06-29 NOTE — Progress Notes (Addendum)
Progress Note  Patient Name: [name] Date of Encounter: 06/29/2016  Primary Cardiologist: Johnny Bridge, MD   Subjective   Breathing slowly improving. Leg swelling also improving.  She remembers discussing with her pulmonologist evaluating for sleep apnea later on as outpt. No CP.   Inpatient Medications    Scheduled Meds: . albuterol  2.5 mg Nebulization Q6H  . amLODipine  10 mg Oral Daily  . aspirin EC  81 mg Oral Daily  . atorvastatin  20 mg Oral q1800  . azithromycin  250 mg Oral Daily  . budesonide  0.25 mg Nebulization BID  . citalopram  40 mg Oral Daily  . docusate sodium  100 mg Oral BID  . heparin  5,000 Units Subcutaneous Q8H  . hydrALAZINE  100 mg Oral Q8H  . insulin aspart  0-15 Units Subcutaneous TID WC  . insulin aspart  0-5 Units Subcutaneous QHS  . insulin aspart  11 Units Subcutaneous TID WC  . insulin glargine  50 Units Subcutaneous QHS  . methylPREDNISolone (SOLU-MEDROL) injection  60 mg Intravenous BID  . metoprolol  75 mg Oral BID  . potassium chloride  10 mEq Oral BID  . sodium chloride flush  3 mL Intravenous Q12H  . [START ON 06/30/2016] torsemide  20 mg Oral Daily   Continuous Infusions: . sodium chloride    . cefTRIAXone (ROCEPHIN) IVPB 1 gram/50 mL D5W     PRN Meds: sodium chloride, acetaminophen **OR** acetaminophen, albuterol, guaiFENesin-dextromethorphan, hydrALAZINE, HYDROcodone-acetaminophen, ondansetron **OR** ondansetron (ZOFRAN) IV, polyethylene glycol, sodium chloride flush   Vital Signs    Vitals:   06/28/16 2018 06/29/16 0214 06/29/16 0410 06/29/16 0831  BP: (!) 146/65  (!) 173/84 (!) 167/81  Pulse: 78  71 75  Resp: 17  15   Temp: 99.2 F (37.3 C)  98.3 F (36.8 C) 98.4 F (36.9 C)  TempSrc: Oral  Oral   SpO2: 100% 95% 93% 94%  Weight:   (!) 333 lb 4.8 oz (151.2 kg)   Height:        Intake/Output Summary (Last 24 hours) at 06/29/16 1022 Last data filed at 06/29/16 1001  Gross per 24 hour  Intake             1405  ml  Output             1650 ml  Net             -245 ml   Filed Weights   06/27/16 0422 06/28/16 0528 06/29/16 0410  Weight: (!) 337 lb 11.2 oz (153.2 kg) (!) 341 lb 1.6 oz (154.7 kg) (!) 333 lb 4.8 oz (151.2 kg)    Physical Exam   PHYSICAL EXAM: VS:  BP (!) 167/81   Pulse 75   Temp 98.4 F (36.9 C)   Resp 15   Ht 5\' 4"  (1.626 m)   Wt (!) 333 lb 4.8 oz (151.2 kg)   LMP 03/28/2016   SpO2 94%   BMI 57.21 kg/m  , BMI Body mass index is 57.21 kg/m. GENERAL:  well developed, well nourished, morbidly obese, not in acute distress HEENT: normocephalic, pink conjunctivae, anicteric sclerae, no xanthelasma, normal dentition, oropharynx clear NECK:  Difficult to assess for NVE but no HJR carotid upstroke brisk and symmetric, no bruit, no thyromegaly, no lymphadenopathy LUNGS:  good respiratory effort, distant breath sounds, overall clear to auscultation bilaterally CV:  PMI not displaced, no thrills, no lifts, S1 and S2 within normal limits, no palpable  S3 or S4, no murmurs, no rubs, no gallops ABD:  Soft, nontender, nondistended, normoactive bowel sounds, no abdominal aortic bruit, no hepatomegaly, no splenomegaly MS: nontender back, no kyphosis, no scoliosis, no joint deformities EXT:  2+ DP/PT pulses, no pitting edema, no varicosities, no cyanosis, no clubbing SKIN: warm, nondiaphoretic, normal turgor, no ulcers NEUROPSYCH: alert, oriented to person, place, and time, sensory/motor grossly intact, normal mood, appropriate affect    Labs    Chemistry  Recent Labs Lab 06/27/16 0006 06/28/16 0620 06/29/16 0501  NA 134* 135 134*  K 4.3 4.2 4.2  CL 89* 88* 88*  CO2 39* 38* 39*  GLUCOSE 360* 167* 297*  BUN 43* 51* 61*  CREATININE 2.14* 2.07* 1.90*  CALCIUM 8.4* 8.5* 8.2*  ALBUMIN 2.7*  --   --   GFRNONAA 27* 28* 31*  GFRAA 31* 32* 36*  ANIONGAP 6 9 7      Hematology  Recent Labs Lab 06/25/16 1304 06/26/16 0358 06/29/16 0501  WBC 6.3 6.9 8.0  RBC 3.56* 3.70* 3.92    HGB 9.4* 10.4* 10.4*  HCT 30.4* 31.9* 33.4*  MCV 85.4 86.1 85.4  MCH 26.5 28.0 26.5  MCHC 31.0* 32.5 31.1*  RDW 16.2* 15.8* 15.9*  PLT 390 387 499*    Cardiac Enzymes  Recent Labs Lab 06/25/16 1304 06/26/16 1226 06/26/16 1754 06/27/16 0006  TROPONINI 0.03* 0.03* 0.03* 0.03*    BNP  Recent Labs Lab 06/25/16 1304  BNP 475.0*      Radiology    No results found.  Telemetry    Sinus rhythm - Personally Reviewed  Cardiac Studies   2D Echocardiogram 5.3.2018  Study Conclusions  - Procedure narrative: Transthoracic echocardiography. Image   quality was suboptimal. The study was technically difficult. - Left ventricle: The cavity size was normal. There was moderate   concentric hypertrophy. Systolic function was normal. The   estimated ejection fraction was in the range of 60% to 65%. Wall   motion was normal; there were no regional wall motion   abnormalities. The study is not technically sufficient to allow   evaluation of LV diastolic function. - Left atrium: The atrium was moderately dilated. - Right ventricle: Systolic function was normal. - Pulmonary arteries: Systolic pressure could not be accurately   estimated. - Pericardium, extracardiac: A trivial pericardial effusion was   identified.  Patient Profile     45 y.o. female with history of chronic diastolic CHF, chronic respiratory failure on home oxygen at 3-4 L secondary to COPD, CKD stage III, anemia in the setting of heavy menses requiring iron infusions, asthma, morbid obesity, and medication noncompliance who presented to [facility] on 5/1 with weakness and was found to have acute on chronic respiratory failure 2/2 AECOPD and acute on chronic diastolic CHF 2/2 medication noncompliance.  Assessment & Plan    1.  Acute on chronic resp failure w/ hypoxia/AECOPD: Multifactorial from COPD, obesity, poss OHS, poss OSA, very mild volume overload Continue treatment of mult medical issues  rec outpt eval for  osa  2.  Acute on chronic HFpEF:  Minus 540cc. Unsure of accuracy of weights. No pitting edema. Agree with diuretic dosing per nephrology, torsemide 20mg  po qd tomorrow from previous bid. Cont to monitor renal function. Control BP. losartan on hold in setting of rising creat - hopefully can resume if renal fxn stable.  Consider switch to Coreg, addition of nitrate (together with hydralazine) Sodium restriction. Sleep study as outpt.    3.  Hypertensive Heart Dzs:  See #2.    4.  Elevated troponin:  Peak 0.03.  EF nl on echo.  Likely demand ischemia in setting of resp failure and HTN.  No plan for ischemic eval @ this time.  Cont asa, statin,  blocker. No change in recommendation from 06/28/2016.  5.  Acute on CKD III:  f/u this am.  Nephrology following.  6.  Morbid obesity/OHS:  Will need ongoing nutritional services eval and mgmt.  Plan on outpt sleep eval. No change in recommendation from 06/28/2016.  I spent at least 35 minutes with the patient today and more than 50% of the time was spent counseling the patient and coordinating care.   Signed, Wende Bushy, MD  06/29/2016, 10:22 AM

## 2016-06-29 NOTE — Progress Notes (Signed)
Patient given discharge teaching and paperwork regarding medications, diet, follow-up appointments and activity. Patient understanding verbalized. No complaints at this time. . IV and telemetry discontinued prior to leaving. Skin assessment as previously charted and vitals are stable, chronic home O2. Patient being discharged to home. Caregiver/family present during discharge teaching. No further needs by Care Management. Prescriptions handed to patient.

## 2016-06-29 NOTE — Progress Notes (Signed)
PHARMACIST - PHYSICIAN COMMUNICATION DR:   Anselm Jungling CONCERNING: Antibiotic IV to Oral Route Change Policy  RECOMMENDATION: This patient is receiving azithromycin by the intravenous route.  Based on criteria approved by the Pharmacy and Therapeutics Committee, the antibiotic(s) is/are being converted to the equivalent oral dose form(s).   DESCRIPTION: These criteria include:  Patient being treated for a respiratory tract infection, urinary tract infection, cellulitis or clostridium difficile associated diarrhea if on metronidazole  The patient is not neutropenic and does not exhibit a GI malabsorption state  The patient is eating (either orally or via tube) and/or has been taking other orally administered medications for a least 24 hours  The patient is improving clinically and has a Tmax < 100.5  If you have questions about this conversion, please contact the [place], PharmD 06/29/16 9:52 AM

## 2016-06-29 NOTE — Discharge Summary (Signed)
[facility] [place] NAME: [name]    MR#:  [id number]  DATE OF BIRTH:  1970-08-05  DATE OF ADMISSION:  06/25/2016 ADMITTING PHYSICIAN: Hillary Bow, MD  DATE OF DISCHARGE: 06/29/2016  PRIMARY CARE PHYSICIAN: [name], NP    ADMISSION DIAGNOSIS:  Anasarca [R60.1] Hypoxia [R09.02] Acute renal failure, unspecified acute renal failure type ([place]) [N17.9]  DISCHARGE DIAGNOSIS:  Active Problems:   Hypertensive heart disease with congestive heart failure (HCC)   Acute on chronic diastolic CHF (congestive heart failure) (HCC)   Morbid obesity (HCC)   Iron deficiency anemia   Poorly controlled type 2 diabetes mellitus (HCC)   Asthma   COPD exacerbation (HCC)   Acute on chronic respiratory failure (HCC)   CKD (chronic kidney disease), stage III   SECONDARY DIAGNOSIS:   Past Medical History:  Diagnosis Date  . Asthma   . CHF (congestive heart failure) ([place])   . COPD (chronic obstructive pulmonary disease) ([place])   . Diabetes mellitus without complication ([place])   . Hypertension   . Iron deficiency anemia   . Pericardial effusion    a. 11/2014 CT Chest: Mod effusion.  [name] Poorly controlled diabetes mellitus ([place])    a. 11/2014 A1c 13.2.    HOSPITAL COURSE:   * Acute on chronic respiratory failure due to combination of congestive heart failure and COPD. Nebulizers when necessary. Wean oxygen as tolerated. Present on 4 L nasal cannula. Normally on 3-4 L at home.  * Acute on chronic diastolic congestive heart failure - IV Lasix, Beta blockers - Input and Output- > 2 ltr negative balance in hospital. - Counseled to limit fluids and Salt - Monitor Bun/Cr and Potassium - Echo - reviewed.  -Cardiology consult appreciated. - increased torsemide dose. - her albumin level is also low. - now her bicarb is rising and renal func improved, looks she is appropriately diuresed.   Body weight is not reliable measured.  Clinically she appears euvolemic or some over diuresed.   Will cont home dose of diuresis on d/c  * Acute COPD exacerbation. -IV steroids - Scheduled Nebulizers - Inhalers -Wean O2 as tolerated -  As some sputum, will give Abx for 5 days on d/c.  * Acute renal failure on CKD stage III. Likely cardiorenal syndrome. Continue Lasix. Consult nephrology. Monitor input and output. Daily weight.   Renal sono without obstructions. - Appears stabilized.  * Insulin dependent diabetes mellitus. Continue patient's Levemir. Sliding scale insulin. Will likely be uncontrolled due to IV steroid use.   Advised to stop metformin due to renal issues on d/c.  * DVT prophylaxis with heparin  DISCHARGE CONDITIONS:   Stable.  CONSULTS OBTAINED:  Treatment Team:  Lavonia Dana, MD Minna Merritts, MD  DRUG ALLERGIES:   Allergies  Allergen Reactions  . Feraheme [Ferumoxytol] Itching  . Sulfa Antibiotics Itching    DISCHARGE MEDICATIONS:   Current Discharge Medication List    START taking these medications   Details  atorvastatin (LIPITOR) 20 MG tablet Take 1 tablet (20 mg total) by mouth daily at 6 PM. Qty: 30 tablet, Refills: 0    azithromycin (ZITHROMAX) 250 MG tablet Take 1 tablet (250 mg total) by mouth daily. Qty: 4 each, Refills: 0    cefUROXime (CEFTIN) 250 MG tablet Take 1 tablet (250 mg total) by mouth 2 (two) times daily with a meal. Qty: 8 tablet, Refills: 0    polyethylene glycol (MIRALAX / GLYCOLAX) packet Take  17 g by mouth daily as needed for mild constipation. Qty: 14 each, Refills: 0    predniSONE (STERAPRED UNI-PAK 21 TAB) 10 MG (21) TBPK tablet Take 6 tabs first day, 5 tab on day 2, then 4 on day 3rd, 3 tabs on day 4th , 2 tab on day 5th, and 1 tab on 6th day. Qty: 21 tablet, Refills: 0      CONTINUE these medications which have CHANGED   Details  metoprolol 75 MG TABS Take 75 mg by mouth 2 (two) times daily. Qty: 60 tablet, Refills: 0    torsemide  (DEMADEX) 20 MG tablet Take 1 tablet (20 mg total) by mouth daily. Qty: 30 tablet, Refills: 0      CONTINUE these medications which have NOT CHANGED   Details  acetaminophen (TYLENOL) 500 MG tablet Take 1,000 mg by mouth every 6 (six) hours as needed for mild pain, fever or headache.    albuterol (PROVENTIL HFA;VENTOLIN HFA) 108 (90 BASE) MCG/ACT inhaler Inhale 2 puffs into the lungs every 6 (six) hours as needed for wheezing or shortness of breath. Qty: 1 Inhaler, Refills: 0    albuterol (PROVENTIL) (2.5 MG/3ML) 0.083% nebulizer solution Take 2.5 mg by nebulization every 6 (six) hours as needed for wheezing or shortness of breath.    amLODipine (NORVASC) 10 MG tablet Take 1 tablet (10 mg total) by mouth daily. Qty: 30 tablet, Refills: 0    aspirin EC 81 MG tablet Take 81 mg by mouth daily.     citalopram (CELEXA) 40 MG tablet Take 40 mg by mouth daily.    hydrALAZINE (APRESOLINE) 100 MG tablet Take 1 tablet (100 mg total) by mouth every 8 (eight) hours. Qty: 60 tablet, Refills: 0    insulin aspart (NOVOLOG FLEXPEN) 100 UNIT/ML FlexPen Inject 0-12 Units into the skin 3 (three) times daily with meals as needed for high blood sugar. Pt uses as needed per sliding scale.    Insulin Degludec (TRESIBA FLEXTOUCH) 100 UNIT/ML SOPN Inject 45 Units into the skin at bedtime. Qty: 1 pen, Refills: 2    metolazone (ZAROXOLYN) 5 MG tablet Take 1 tablet (5 mg total) by mouth daily. Qty: 20 tablet, Refills: 0    potassium chloride (K-DUR,KLOR-CON) 10 MEQ tablet Take 1 tablet (10 mEq total) by mouth 2 (two) times daily. Can take an additional tablet when takes the metolazone Qty: 80 tablet, Refills: 5    simvastatin (ZOCOR) 40 MG tablet Take 40 mg by mouth at bedtime.    budesonide (PULMICORT) 0.25 MG/2ML nebulizer solution Take 2 mLs (0.25 mg total) by nebulization 2 (two) times daily. Qty: 60 mL, Refills: 12    guaiFENesin-dextromethorphan (ROBITUSSIN DM) 100-10 MG/5ML syrup Take 5 mLs by  mouth every 4 (four) hours as needed for cough. Qty: 118 mL, Refills: 0      STOP taking these medications     losartan (COZAAR) 50 MG tablet      metFORMIN (GLUCOPHAGE) 500 MG tablet          DISCHARGE INSTRUCTIONS:    Follow with heart failure clinic and with pulmonologist in 1-2 weeks.  If you experience worsening of your admission symptoms, develop shortness of breath, life threatening emergency, suicidal or homicidal thoughts you must seek medical attention immediately by calling 911 or calling your MD immediately  if symptoms less severe.  You Must read complete instructions/literature along with all the possible adverse reactions/side effects for all the Medicines you take and that have been prescribed to you.  Take any new Medicines after you have completely understood and accept all the possible adverse reactions/side effects.   Please note  You were cared for by a hospitalist during your hospital stay. If you have any questions about your discharge medications or the care you received while you were in the hospital after you are discharged, you can call the unit and asked to speak with the hospitalist on call if the hospitalist that took care of you is not available. Once you are discharged, your primary care physician will handle any further medical issues. Please note that NO REFILLS for any discharge medications will be authorized once you are discharged, as it is imperative that you return to your primary care physician (or establish a relationship with a primary care physician if you do not have one) for your aftercare needs so that they can reassess your need for medications and monitor your lab values.    Today   CHIEF COMPLAINT:   Chief Complaint  Patient presents with  . Weakness    HISTORY OF PRESENT ILLNESS:  [name]  is a 46 y.o. female with a known history of  Diastolic congestive heart failure, COPD, chronic respiratory failure on 3-4 L oxygen, CKD  stage III, morbid obesity, gait abnormalities presents to the emergency room with lower extremity edema, shortness of breath, cough, weakness and fall. Patient has chronic orthopnea. Has lost weight of 60 pounds over the last 6 months. But continues to have significant edema in her lower extremities. Has been following with congestive heart failure clinic. Dr. Ashby Dawes with pulmonary.   VITAL SIGNS:  Blood pressure (!) 167/81, pulse 75, temperature 98.4 F (36.9 C), resp. rate 15, height 5\' 4"  (1.626 m), weight (!) 151.2 kg (333 lb 4.8 oz), last menstrual period 03/28/2016, SpO2 94 %.  I/O:   Intake/Output Summary (Last 24 hours) at 06/29/16 1107 Last data filed at 06/29/16 1001  Gross per 24 hour  Intake             1405 ml  Output             1650 ml  Net             -245 ml    PHYSICAL EXAMINATION:  GENERAL: 46 y.o.-year-old patient lying in the bed , Conversational dyspnea. Morbidly obese EYES: Pupils equal, round, reactive to light and accommodation. No scleral icterus. Extraocular muscles intact.  HEENT: Head atraumatic, normocephalic. Oropharynx and nasopharynx clear. No oropharyngeal erythema, moist oral mucosa  NECK: Supple, no jugular venous distention. No thyroid enlargement, no tenderness.  LUNGS: Bilateral no wheezing, some crepitations with decreased air entry CARDIOVASCULAR: S1, S2 normal. No murmurs, rubs, or gallops.  ABDOMEN: Soft, nontender, nondistended. Bowel sounds present. No organomegaly or mass.  EXTREMITIES: No cyanosis, or clubbing. + 2 pedal &radial pulses b/l. Bilateral lower extremity edema up to her thighs- appears to be chronic due to overweight,. NEUROLOGIC: Cranial nerves II through XII are intact. No focal Motor or sensory deficits appreciated b/l PSYCHIATRIC: The patient is alert and oriented x 3. Good affect.  SKIN: No obvious rash, lesion, or ulcer.    DATA REVIEW:   CBC  Recent Labs Lab 06/29/16 0501  WBC 8.0  HGB 10.4*  HCT 33.4*   PLT 499*    Chemistries   Recent Labs Lab 06/29/16 0501  NA 134*  K 4.2  CL 88*  CO2 39*  GLUCOSE 297*  BUN 61*  CREATININE 1.90*  CALCIUM 8.2*  Cardiac Enzymes  Recent Labs Lab 06/27/16 0006  TROPONINI 0.03*    Microbiology Results  Results for orders placed or performed during the hospital encounter of 02/23/16  Urine culture     Status: Abnormal   Collection Time: 02/23/16  7:37 PM  Result Value Ref Range Status   Specimen Description URINE, RANDOM  Final   Special Requests NONE  Final   Culture >=100,000 COLONIES/mL KLEBSIELLA PNEUMONIAE (A)  Final   Report Status 02/26/2016 FINAL  Final   Organism ID, Bacteria KLEBSIELLA PNEUMONIAE (A)  Final      Susceptibility   Klebsiella pneumoniae - MIC*    AMPICILLIN 16 RESISTANT Resistant     CEFAZOLIN <=4 SENSITIVE Sensitive     CEFTRIAXONE <=1 SENSITIVE Sensitive     CIPROFLOXACIN <=0.25 SENSITIVE Sensitive     GENTAMICIN <=1 SENSITIVE Sensitive     IMIPENEM <=0.25 SENSITIVE Sensitive     NITROFURANTOIN 32 SENSITIVE Sensitive     TRIMETH/SULFA <=20 SENSITIVE Sensitive     AMPICILLIN/SULBACTAM <=2 SENSITIVE Sensitive     PIP/TAZO <=4 SENSITIVE Sensitive     Extended ESBL NEGATIVE Sensitive     * >=100,000 COLONIES/mL KLEBSIELLA PNEUMONIAE    RADIOLOGY:  No results found.  EKG:   Orders placed or performed during the hospital encounter of 03/23/16  . ED EKG  . ED EKG      Management plans discussed with the patient, family and they are in agreement.  CODE STATUS:     Code Status Orders        Start     Ordered   06/25/16 1446  Full code  Continuous     06/25/16 1447    Code Status History    Date Active Date Inactive Code Status Order ID Comments User Context   08/09/2015  5:57 PM 08/13/2015  5:21 PM Full Code [id number]  Bettey Costa, MD Inpatient   12/31/2014  8:25 PM 01/03/2015  5:49 PM Full Code [id number]  Lytle Butte, MD ED   12/15/2014 11:54 AM 12/19/2014  4:57 PM Full Code [id number]   Hillary Bow, MD ED      TOTAL TIME TAKING CARE OF THIS PATIENT: 35 minutes.    Vaughan Basta M.D on 06/29/2016 at 11:07 AM  Between 7am to 6pm - Pager - [phone]  After 6pm go to [email or web address] - password [email or web address] [facility]  Office  [phone]  CC: Primary care physician; [name], NP   Note: This dictation was prepared with Dragon dictation along with smaller phrase technology. Any transcriptional errors that result from this process are unintentional.

## 2016-06-29 NOTE — Progress Notes (Signed)
Pt declines bed alarm. Agrees to call before getting up. Educated.

## 2016-06-29 NOTE — Progress Notes (Signed)
[facility]  ROUNDING NOTE   Subjective:   [place] 4 Litres  Patient states edema and shortness of breath have improved.  Objective:  Vital signs in last 24 hours:  Temp:  [98.3 F (36.8 C)-99.2 F (37.3 C)] 98.4 F (36.9 C) (05/05 0831) Pulse Rate:  [71-79] 75 (05/05 0831) Resp:  [15-20] 15 (05/05 0410) BP: (146-173)/(65-84) 167/81 (05/05 0831) SpO2:  [93 %-100 %] 94 % (05/05 0831) Weight:  [151.2 kg (333 lb 4.8 oz)] 151.2 kg (333 lb 4.8 oz) (05/05 0410)  Weight change: -3.538 kg (-7 lb 12.8 oz) Filed Weights   06/27/16 0422 06/28/16 0528 06/29/16 0410  Weight: (!) 153.2 kg (337 lb 11.2 oz) (!) 154.7 kg (341 lb 1.6 oz) (!) 151.2 kg (333 lb 4.8 oz)    Intake/Output: I/O last 3 completed shifts: In: 6010 [P.O.:1285] Out: 1850 [Urine:1850]   Intake/Output this shift:  Total I/O In: -  Out: 900 [Urine:900]  Physical Exam: General: NAD, sitting up in bed  Head: Normocephalic, atraumatic. Moist oral mucosal membranes  Eyes: Anicteric, PERRL  Neck: Supple, trachea midline  Lungs:  Diminished bilaterally  Heart: Regular rate and rhythm  Abdomen:  Soft, nontender, obese  Extremities: 1+ peripheral edema.  Neurologic: Nonfocal, moving all four extremities  Skin: No lesions       Basic Metabolic Panel:  Recent Labs Lab 06/25/16 1304 06/26/16 0358 06/26/16 1043 06/27/16 0006 06/28/16 0620 06/29/16 0501  NA 137 134*  --  134* 135 134*  K 3.8 4.1  --  4.3 4.2 4.2  CL 93* 90*  --  89* 88* 88*  CO2 38* 39*  --  39* 38* 39*  GLUCOSE 211* 362*  --  360* 167* 297*  BUN 27* 32*  --  43* 51* 61*  CREATININE 2.09* 1.95*  --  2.14* 2.07* 1.90*  CALCIUM 8.2* 8.2*  --  8.4* 8.5* 8.2*  PHOS  --   --  4.0 4.3  --   --     Liver Function Tests:  Recent Labs Lab 06/27/16 0006  ALBUMIN 2.7*   No results for input(s): LIPASE, AMYLASE in the last 168 hours. No results for input(s): AMMONIA in the last 168 hours.  CBC:  Recent Labs Lab 06/25/16 1304  06/26/16 0358 06/29/16 0501  WBC 6.3 6.9 8.0  NEUTROABS 4.8  --   --   HGB 9.4* 10.4* 10.4*  HCT 30.4* 31.9* 33.4*  MCV 85.4 86.1 85.4  PLT 390 387 499*    Cardiac Enzymes:  Recent Labs Lab 06/25/16 1304 06/26/16 1226 06/26/16 1754 06/27/16 0006  TROPONINI 0.03* 0.03* 0.03* 0.03*    BNP: Invalid input(s): POCBNP  CBG:  Recent Labs Lab 06/28/16 0813 06/28/16 1150 06/28/16 1658 06/28/16 2150 06/29/16 0758  GLUCAP 188* 209* 265* 278* 252*    Microbiology: Results for orders placed or performed during the hospital encounter of 02/23/16  Urine culture     Status: Abnormal   Collection Time: 02/23/16  7:37 PM  Result Value Ref Range Status   Specimen Description URINE, RANDOM  Final   Special Requests NONE  Final   Culture >=100,000 COLONIES/mL KLEBSIELLA PNEUMONIAE (A)  Final   Report Status 02/26/2016 FINAL  Final   Organism ID, Bacteria KLEBSIELLA PNEUMONIAE (A)  Final      Susceptibility   Klebsiella pneumoniae - MIC*    AMPICILLIN 16 RESISTANT Resistant     CEFAZOLIN <=4 SENSITIVE Sensitive     CEFTRIAXONE <=1 SENSITIVE Sensitive  CIPROFLOXACIN <=0.25 SENSITIVE Sensitive     GENTAMICIN <=1 SENSITIVE Sensitive     IMIPENEM <=0.25 SENSITIVE Sensitive     NITROFURANTOIN 32 SENSITIVE Sensitive     TRIMETH/SULFA <=20 SENSITIVE Sensitive     AMPICILLIN/SULBACTAM <=2 SENSITIVE Sensitive     PIP/TAZO <=4 SENSITIVE Sensitive     Extended ESBL NEGATIVE Sensitive     * >=100,000 COLONIES/mL KLEBSIELLA PNEUMONIAE    Coagulation Studies: No results for input(s): LABPROT, INR in the last 72 hours.  Urinalysis: No results for input(s): COLORURINE, LABSPEC, PHURINE, GLUCOSEU, HGBUR, BILIRUBINUR, KETONESUR, PROTEINUR, UROBILINOGEN, NITRITE, LEUKOCYTESUR in the last 72 hours.  Invalid input(s): APPERANCEUR    Imaging: No results found.   Medications:   . sodium chloride    . cefTRIAXone (ROCEPHIN) IVPB 1 gram/50 mL D5W     . albuterol  2.5 mg  Nebulization Q6H  . amLODipine  10 mg Oral Daily  . aspirin EC  81 mg Oral Daily  . atorvastatin  20 mg Oral q1800  . azithromycin  250 mg Oral Daily  . budesonide  0.25 mg Nebulization BID  . citalopram  40 mg Oral Daily  . docusate sodium  100 mg Oral BID  . heparin  5,000 Units Subcutaneous Q8H  . hydrALAZINE  100 mg Oral Q8H  . insulin aspart  0-15 Units Subcutaneous TID WC  . insulin aspart  0-5 Units Subcutaneous QHS  . insulin aspart  11 Units Subcutaneous TID WC  . insulin glargine  50 Units Subcutaneous QHS  . methylPREDNISolone (SOLU-MEDROL) injection  60 mg Intravenous BID  . metoprolol  75 mg Oral BID  . potassium chloride  10 mEq Oral BID  . sodium chloride flush  3 mL Intravenous Q12H  . [START ON 06/30/2016] torsemide  20 mg Oral Daily   sodium chloride, acetaminophen **OR** acetaminophen, albuterol, guaiFENesin-dextromethorphan, hydrALAZINE, HYDROcodone-acetaminophen, ondansetron **OR** ondansetron (ZOFRAN) IV, polyethylene glycol, sodium chloride flush  Assessment/ Plan:  [name] is a 46 y.o. black female with congestive heart failure, COPD/asthma, hypertension, diabetes mellitus type II, anemia, anxeity, who was admitted to [facility] on 06/25/2016  1. Acute renal failure on chronic kidney disease stage III with proteinuria: Baseline creatinine of 1.62, GFR of 44 on 04/29/2016. More than 5 grams proteinuria.  Chronic kidney disease secondary to diabetic nephropathy, hypertension Acute renal failure from acute exacerbation of congestive heart failure with acute cardiorenal syndrome. No acute indication for dialysis.  SPEP/UPEP negative. Ultrasound with chronic kidney disease - Holding losartan - Renally dose all medications.  - now with metabolic alkalosis: hold torsemide, restart tomorrow.   2. Hypertension/ acute exacerbation of diastolic congestive heart failure/edema/anasarca: holding losartan  - fluid restriction - amlodipine, metoprolol.  - torsemide as  above.   3. Diabetes mellitus type II with chronic kidney disease: uncontrolled. Hemoglobin A1c 8.2%  4. Anemia with chronic kidney disease: Hemoglobin 10.4  5. Secondary Hyperparathyroidism: PTH 134. Calcium and phosphorus at goal. Not currently on a vitamin D agent   LOS: 4 Breanna Mcdaniel 5/5/20189:58 AM

## 2016-06-29 NOTE — Care Management Note (Signed)
Case Management Note  Patient Details  Name: LABERTA WILBON MRN: [id number] Date of Birth: 08/21/1970  Subjective/Objective:        Discussed discharge planning with Mrs Davoli. Mrs Lutter refuses offer of home health to this writer as she did yesterday to another [employer] manager. Mrs Mccardle already has [facility] providing some home health services.             Action/Plan:   Expected Discharge Date:  06/29/16               Expected Discharge Plan:  [place]  In-House Referral:     Discharge planning Services  CM Consult, HF Clinic  Post Acute Care Choice:   Refused [facility] Choice offered to:   Ms [employer]  DME Arranged:   NA DME Agency:     HH Arranged:  RN [place] Agency:  [place] 06/29/16=Ms Mccombs refused offer of home health services. Already has [facility] in the home.   Status of Service:  In process, will continue to follow   Discharged home 06/29/16. If discussed at [place] of Stay Meetings, dates discussed:    Additional Comments:  Symiah Nowotny A, RN 06/29/2016, 11:33 AM

## 2016-06-29 NOTE — Progress Notes (Signed)
Spoke with Care Manager, Jeani Hawking. Patient refusing home health. Ok to proceed with discharge once IV ABX finish.

## 2016-07-03 ENCOUNTER — Ambulatory Visit: Payer: Medicaid Other | Attending: Family | Admitting: Family

## 2016-07-03 ENCOUNTER — Encounter: Payer: Self-pay | Admitting: Family

## 2016-07-03 VITALS — BP 142/80 | HR 81 | Resp 20 | Ht 64.0 in | Wt 326.2 lb

## 2016-07-03 DIAGNOSIS — I5032 Chronic diastolic (congestive) heart failure: Secondary | ICD-10-CM | POA: Insufficient documentation

## 2016-07-03 DIAGNOSIS — I10 Essential (primary) hypertension: Secondary | ICD-10-CM

## 2016-07-03 DIAGNOSIS — J441 Chronic obstructive pulmonary disease with (acute) exacerbation: Secondary | ICD-10-CM | POA: Insufficient documentation

## 2016-07-03 DIAGNOSIS — J449 Chronic obstructive pulmonary disease, unspecified: Secondary | ICD-10-CM

## 2016-07-03 DIAGNOSIS — I110 Hypertensive heart disease with heart failure: Secondary | ICD-10-CM | POA: Insufficient documentation

## 2016-07-03 NOTE — Progress Notes (Signed)
Patient ID: [name], female    DOB: [birth year], 46 y.o.   MRN: [id number]  HPI  [name] is a 46 y/o female with a history of DM, iron deficiency anemia, HTN, COPD with chronic oxygen use, asthma, obesity and chronic heart failure.   Reviewed last echo done 06/27/16 which showed an EF of 60-65%. Previous echo was done 12/15/14 and showed an EF of 50-55% without any valvular regurgitation.   Admitted 06/25/16 with HF/COPD exacerbation. Initially treated with IV diuretics and transitioned to oral diuretics. Cardiology consult obtained. Also needed IV steroids initially along with nebulizer treatment. Nephrology consult also obtained. Discharged home after 4 days. Was in the ED on 03/23/16 with exacerbation of HF. Was treated and released. Prior ED visit was 02/23/16 for acute cystitis.  She presents today for a follow-up visit with a chief complaint of moderate shortness of breath with little exertion. She says that she's chronically short of breath and has been for "a long time". Wearing continuous oxygen helps improve her shortness of breath. She has associated fatigue, cough, weakness and lightheadedness with this.   Past Medical History:  Diagnosis Date  . Asthma   . CHF (congestive heart failure) ([place])   . COPD (chronic obstructive pulmonary disease) ([place])   . Diabetes mellitus without complication ([place])   . Hypertension   . Iron deficiency anemia   . Pericardial effusion    a. 11/2014 CT Chest: Mod effusion.  [name] Poorly controlled diabetes mellitus ([place])    a. 11/2014 A1c 13.2.   Past Surgical History:  Procedure Laterality Date  . CESAREAN SECTION     Family History  Problem Relation Age of Onset  . Diabetes Mother   . Hypertension Mother   . Hypertension Father   . Diabetes Father   . Hypertension    . Diabetes    . Breast cancer Maternal Aunt 31  . Breast cancer Maternal Aunt 66   Social History  Substance Use Topics  . Smoking status: Never Smoker  . Smokeless  tobacco: Never Used  . Alcohol use 0.0 oz/week     Comment: occ   Allergies  Allergen Reactions  . Feraheme [Ferumoxytol] Itching  . Sulfa Antibiotics Itching   Prior to Admission medications   Medication Sig Start Date End Date Taking? Authorizing Provider  acetaminophen (TYLENOL) 500 MG tablet Take 1,000 mg by mouth every 6 (six) hours as needed for mild pain, fever or headache.   Yes Historical Provider, MD  albuterol (PROVENTIL HFA;VENTOLIN HFA) 108 (90 BASE) MCG/ACT inhaler Inhale 2 puffs into the lungs every 6 (six) hours as needed for wheezing or shortness of breath. 12/19/14  Yes Dustin Flock, MD  amLODipine (NORVASC) 10 MG tablet Take 1 tablet (10 mg total) by mouth daily. 12/19/14  Yes Dustin Flock, MD  aspirin EC 81 MG tablet Take 81 mg by mouth at bedtime.   Yes Historical Provider, MD  budesonide (PULMICORT) 0.25 MG/2ML nebulizer solution Take 2 mLs (0.25 mg total) by nebulization 2 (two) times daily. 12/19/14  Yes Dustin Flock, MD  citalopram (CELEXA) 40 MG tablet Take 40 mg by mouth daily.   Yes Historical Provider, MD  guaiFENesin-dextromethorphan (ROBITUSSIN DM) 100-10 MG/5ML syrup Take 5 mLs by mouth every 4 (four) hours as needed for cough. 08/13/15  Yes Demetrios Loll, MD  hydrALAZINE (APRESOLINE) 100 MG tablet Take 1 tablet (100 mg total) by mouth every 8 (eight) hours. 12/19/14  Yes Dustin Flock, MD  insulin aspart (NOVOLOG FLEXPEN)  100 UNIT/ML FlexPen Inject 0-12 Units into the skin 3 (three) times daily with meals as needed for high blood sugar. Pt uses as needed per sliding scale.   Yes Historical Provider, MD  Insulin Degludec (TRESIBA FLEXTOUCH) 100 UNIT/ML SOPN Inject 45 Units into the skin at bedtime. 08/13/15  Yes Demetrios Loll, MD  ipratropium-albuterol (DUONEB) 0.5-2.5 (3) MG/3ML SOLN Take 3 mLs by nebulization every 6 (six) hours as needed (for wheezing/shortness of breath).   Yes Historical Provider, MD  losartan (COZAAR) 50 MG tablet Take 1 tablet (50 mg total)  by mouth daily. 03/25/16  Yes Alisa Graff, FNP  metFORMIN (GLUCOPHAGE) 500 MG tablet Take 500 mg by mouth 2 (two) times daily with a meal.   Yes Historical Provider, MD  metoprolol (LOPRESSOR) 50 MG tablet Take 50 mg by mouth 2 (two) times daily.   Yes Historical Provider, MD  potassium chloride (K-DUR,KLOR-CON) 10 MEQ tablet Take 1 tablet (10 mEq total) by mouth 2 (two) times daily. Can take an additional tablet when takes the metolazone 04/12/16  Yes Alisa Graff, FNP  simvastatin (ZOCOR) 40 MG tablet Take 40 mg by mouth at bedtime.   Yes Historical Provider, MD  torsemide (DEMADEX) 20 MG tablet Take 2 tablets (40 mg total) by mouth daily. 03/25/16  Yes Alisa Graff, FNP  metolazone (ZAROXOLYN) 5 MG tablet Take 1 tablet (5 mg total) by mouth daily. 06/07/16 09/05/16  Alisa Graff, FNP    Review of Systems  Constitutional: Positive for appetite change and fatigue.  HENT: Negative for congestion, postnasal drip and sinus pressure.   Eyes: Negative.   Respiratory: Positive for cough (thick cough) and shortness of breath. Negative for chest tightness.   Cardiovascular: Negative for chest pain, palpitations and leg swelling.  Gastrointestinal: Negative for abdominal distention and abdominal pain.  Endocrine: Negative.   Genitourinary: Negative.   Musculoskeletal: Negative for back pain and neck pain.  Skin: Negative.   Allergic/Immunologic: Negative.   Neurological: Positive for weakness (improving) and light-headedness.  Hematological: Negative for adenopathy. Does not bruise/bleed easily.  Psychiatric/Behavioral: Negative for dysphoric mood, sleep disturbance and suicidal ideas. The patient is nervous/anxious.    Vitals:   07/03/16 1155  Pulse: 81  Resp: 20  SpO2: 93%  Weight: (!) 326 lb 4 oz (148 kg)  Height: 5\' 4"  (1.626 m)   Wt Readings from Last 3 Encounters:  07/03/16 (!) 326 lb 4 oz (148 kg)  06/29/16 (!) 333 lb 4.8 oz (151.2 kg)  06/07/16 (!) 338 lb 2 oz (153.4 kg)    Lab Results  Component Value Date   CREATININE 1.90 (H) 06/29/2016   CREATININE 2.07 (H) 06/28/2016   CREATININE 2.14 (H) 06/27/2016    Physical Exam  Constitutional: She is oriented to person, place, and time. She appears well-developed and well-nourished.  HENT:  Head: Normocephalic and atraumatic.  Neck: Normal range of motion. Neck supple. No JVD present.  Cardiovascular: Normal rate and regular rhythm.   Pulmonary/Chest: Effort normal. She has no wheezes. She has no rales.  Abdominal: She exhibits no distension. There is no tenderness.  Musculoskeletal: She exhibits no edema or tenderness.  Neurological: She is alert and oriented to person, place, and time.  Skin: Skin is warm and dry.  Psychiatric: She has a normal mood and affect. Her behavior is normal. Thought content normal.  Nursing note and vitals reviewed.   Assessment & Plan:  1: Chronic heart failure with preserved ejection fraction- - NYHA class III -  euvolemic today - weight down 11.8 pounds since she was last here. Continue weighing daily and call for a weight gain of >2 pounds, weekly weight gain of > 5 pounds, worsening swelling or worsening shortness of breath - continue daily torsemide - BMP done 06/29/16 reviewed and shows potassium 4.2 with GFR 36. - not adding salt and trying to follow a 2000mg  sodium diet - encouraged increased activty - last saw cardiologist Rockey Situ) 12/25/15.   2: COPD- - patient has to make an appointment to have pulmonary function tests completed before an appointment can be made with pulmonologist Ashby Dawes). Last was seen 01/04/16 - needs to resume pulmonary rehab to increase her endurance, increase muscle strength and begin to lose weight - wears oxygen on a pulse setting at home at 5L around the clock; placed on continuous oxygen flow while here due to decreased oxygen saturations after exertion. On 5L pulse setting upon arrival to office her O2 sat was 66%; placed on 4L  continuous and it quickly rose to 93%.   3: HTN- - BP looks good today - saw PCP on 04/29/16 - nephrology [facility]) saw patient during hospitalization and she has to make a follow-up appointment  Patient did not bring her medications nor a list. Each medication was verbally reviewed with the patient and she was encouraged to bring the bottles to every visit to confirm accuracy of list.  Return in 2 months or sooner for any questions/problems before then.

## 2016-07-03 NOTE — Patient Instructions (Addendum)
Follow up with Pulmonolgy [phone] and Dr. Juleen China [phone].

## 2016-07-04 DIAGNOSIS — I5033 Acute on chronic diastolic (congestive) heart failure: Secondary | ICD-10-CM | POA: Insufficient documentation

## 2016-07-04 DIAGNOSIS — J449 Chronic obstructive pulmonary disease, unspecified: Secondary | ICD-10-CM | POA: Insufficient documentation

## 2016-07-08 ENCOUNTER — Telehealth: Payer: Self-pay | Admitting: Internal Medicine

## 2016-07-08 NOTE — Telephone Encounter (Signed)
Pt needs hosp f/u with Dr. Ashby Dawes. Please advise of date and time.

## 2016-07-08 NOTE — Telephone Encounter (Signed)
Hosp f/u scheduled 07/17/16 DK.

## 2016-07-17 ENCOUNTER — Ambulatory Visit (INDEPENDENT_AMBULATORY_CARE_PROVIDER_SITE_OTHER): Payer: Medicaid Other | Admitting: Internal Medicine

## 2016-07-17 ENCOUNTER — Encounter: Payer: Self-pay | Admitting: Internal Medicine

## 2016-07-17 VITALS — BP 144/80 | HR 107 | Ht 64.0 in | Wt 305.0 lb

## 2016-07-17 DIAGNOSIS — J9611 Chronic respiratory failure with hypoxia: Secondary | ICD-10-CM

## 2016-07-17 DIAGNOSIS — J4550 Severe persistent asthma, uncomplicated: Secondary | ICD-10-CM

## 2016-07-17 MED ORDER — PREDNISONE 20 MG PO TABS: 40.0000 mg | ORAL_TABLET | Freq: Every day | ORAL | 0 refills | Status: DC

## 2016-07-17 NOTE — Patient Instructions (Addendum)
Prednisone 40 mg daily for 10 days assess for hypoxia with RT in 2 weeks

## 2016-07-17 NOTE — Progress Notes (Signed)
[place] [facility] Consultation    Date: 07/17/2016  MRN# [id number] AMULYA QUINTIN 03-19-1970  Referring Physician:   VERNEL DONLAN is a 46 y.o. old female seen in consultation for chief complaint of:    CC follow up SOB  HPI  She is on oxygen since her discharged from the hospital, she is now on 3L, she currently has some congestion and mucus. She checks her oxygen at home, without the oxygen at rest her sat will be at 95% but if she moves around it will drop.   She ntoes that she has chronic dyspnea with chronic CHF. She has been diagnosed with dypsnea when she was a child. She is currently on proair inhaler which she uses 2 to 3 times per day. She also uses albuterol and pulmicort via a nebulizer, though she does not use the pulmicort very often but she does use the albuterol.   Now with wheezing and increased SOB +ASTHMA exacerbation at this time  Previous Studies Review of images chest x-ray 2 view 08/09/15; diffuse interstitial changes, with cephalization. Obesity. Differential includes pulmonary edema due to congestive heart failure versus interstitial lung disease. CT chest 12/15/14: Bibasilar atelectasis, morbid obesity, cardiomegaly, enlarged left atrium, normal RV. Small pericardial effusion  Echocardiogram 12/15/14: EF equals 55%, mild left atrial dilation, small to moderate pericardial effusion, no TR noted.   PMHX:   Past Medical History:  Diagnosis Date  . Asthma   . CHF (congestive heart failure) ([place])   . COPD (chronic obstructive pulmonary disease) ([place])   . Diabetes mellitus without complication ([place])   . Hypertension   . Iron deficiency anemia   . Pericardial effusion    a. 11/2014 CT Chest: Mod effusion.  [name] Poorly controlled diabetes mellitus ([place])    a. 11/2014 A1c 13.2.     Allergies:  Feraheme [ferumoxytol]; Pollen extract; and Sulfa antibiotics  Review of Systems: Gen:  Denies  fever, sweats, chills HEENT: Denies blurred vision,  double vision. bleeds, sore throat Cvc:  No dizziness, chest pain. Resp:   + cough +SOB +wheezing Gi: Denies swallowing difficulty, stomach pain. Gu:  Denies bladder incontinence, burning urine Ext:   No Joint pain, stiffness. Skin: No skin rash,  hives  Endoc:  No polyuria, polydipsia. Psych: No depression, insomnia. Other:  All other systems were reviewed with the patient and were negative other that what is mentioned in the HPI.   Physical Examination:   VS: Ht 5\' 4"  (1.626 m)   General Appearance: No distress  Neuro:without focal findings,  speech normal,  HEENT: PERRLA, EOM intact.   Pulmonary: +wheezing.  CardiovascularNormal S1,S2.  No m/r/g.   Abdomen: Benign, Soft, non-tender. Extremities: normal, no cyanosis, clubbing.    Assessment and Plan: 46 yo female with multiple medical issues all related to morbid obesity with SOB and DOE with chronic hypoxic resp failure with diastolic heart failure. Patient will need Oxygen therapy for rest of her life to survive. She also has  severe persistent ASTHMA   Asthma/-severe persistent ASTHMA -use Pulmicort nebulized once daily, and use nebulized albuterol and Ventolin as needed. -patient with wheezing at this time Will need prednisone 40 mg daily for 10 days   Dyspnea with chronic hypoxic respiratory failure. -I suspect that the major driver of her dyspnea is morbid obesity, restrictive lung disease related to obesity, and pulmonary restriction from cardiomegaly. She will need oxygen 24/7 at this time   Morbid obesity. -contribution of obesity to Pulmonary restriction. -She is  being referred to nutrition for nutrition counseling.  Deconditioning. -Likely contributing to dyspnea. -We referred the patient back to pulmonary rehabilitation.  Chronic diastolic congestive heart failure with cardiomegaly, history of pericardial effusion. -Continue to follow up with cardiology.  Overall, very poor prognosis  Follow up with Dr  Juanell Fairly in 3-4 months  Patient/Family are satisfied with Plan of action and management. All questions answered  Corrin Parker, M.D.  [facility]  Medical Director [place] Director [facility] Cardio-Pulmonary Department

## 2016-08-02 ENCOUNTER — Other Ambulatory Visit: Payer: Self-pay

## 2016-08-02 MED ORDER — TORSEMIDE 20 MG PO TABS: 40.0000 mg | ORAL_TABLET | Freq: Every day | ORAL | 5 refills | Status: DC

## 2016-08-19 ENCOUNTER — Telehealth: Payer: Self-pay | Admitting: Internal Medicine

## 2016-08-19 MED ORDER — PREDNISONE 20 MG PO TABS: 20.0000 mg | ORAL_TABLET | Freq: Every day | ORAL | 0 refills | Status: DC

## 2016-08-19 NOTE — Telephone Encounter (Signed)
sent 

## 2016-08-19 NOTE — Telephone Encounter (Signed)
Pt calling stating her Asthma is acting up again  Still cough, and SOB  Would like to know if we can call in something to help with this  Please call back

## 2016-08-19 NOTE — Telephone Encounter (Signed)
Prednisone 20 mg daily for 7 days 

## 2016-08-21 NOTE — Progress Notes (Signed)
[place]  [phone]) [phone] [phone]) [phone]  ID: [name] OB: [birth year]  MR#: [id number]  HAL#:[id number]  Patient Care Team: [name], NP as PCP - General (Nurse Practitioner) Minna Merritts, MD as Consulting Physician (Cardiology) Alisa Graff, FNP as Nurse Practitioner (Family Medicine) Laverle Hobby, MD as Consulting Physician (Pulmonary Disease)  CHIEF COMPLAINT: Iron deficiency anemia.  INTERVAL HISTORY: Patient returns to clinic today for repeat laboratory work and further evaluation. She has chronic weakness and fatigue and has been feeling cold. She has no neurologic complaints. She also continues to have chronic shortness of breath and requires oxygen 24 hours per day.  She denies any fevers. She has no chest pain, cough, or hemoptysis. She has significant weight gain of 26 lbs in one month. She was given prednisone by her pulmonologist and she just recently stopped this. She takes torsemide 20 mg BID and will continue to do so. She denies any nausea, vomiting, consultation, or diarrhea. She has no melena or hematochezia. Patient otherwise feels well and offers no further specific complaints.  REVIEW OF SYSTEMS:   Review of Systems  Constitutional: Positive for malaise/fatigue. Negative for fever and weight loss.  Respiratory: Positive for shortness of breath. Negative for hemoptysis and sputum production.   Cardiovascular: Negative.  Negative for chest pain.  Gastrointestinal: Negative.  Negative for abdominal pain, blood in stool and melena.  Genitourinary: Negative.   Musculoskeletal: Negative.   Neurological: Positive for weakness.  Psychiatric/Behavioral: Negative.  The patient is not nervous/anxious.     As per HPI. Otherwise, a complete review of systems is negative.  PAST MEDICAL HISTORY: Past Medical History:  Diagnosis Date  . Asthma   . CHF (congestive heart failure) ([place])   . COPD (chronic  obstructive pulmonary disease) ([place])   . Diabetes mellitus without complication ([place])   . Hypertension   . Iron deficiency anemia   . Pericardial effusion    a. 11/2014 CT Chest: Mod effusion.  [name] Poorly controlled diabetes mellitus ([place])    a. 11/2014 A1c 13.2.    PAST SURGICAL HISTORY: Past Surgical History:  Procedure Laterality Date  . CESAREAN SECTION      FAMILY HISTORY Family History  Problem Relation Age of Onset  . Diabetes Mother   . Hypertension Mother   . Hypertension Father   . Diabetes Father   . Hypertension Unknown   . Diabetes Unknown   . Breast cancer Maternal Aunt 57  . Breast cancer Maternal Aunt 24       ADVANCED DIRECTIVES:    HEALTH MAINTENANCE: Social History  Substance Use Topics  . Smoking status: Never Smoker  . Smokeless tobacco: Never Used  . Alcohol use 0.0 oz/week     Comment: occ     Colonoscopy:  PAP:  Bone density:  Lipid panel:  Allergies  Allergen Reactions  . Feraheme [Ferumoxytol] Itching  . Pollen Extract   . Sulfa Antibiotics Itching    Current Outpatient Prescriptions  Medication Sig Dispense Refill  . acetaminophen (TYLENOL) 500 MG tablet Take 1,000 mg by mouth every 6 (six) hours as needed for mild pain, fever or headache.    . albuterol (PROVENTIL HFA;VENTOLIN HFA) 108 (90 BASE) MCG/ACT inhaler Inhale 2 puffs into the lungs every 6 (six) hours as needed for wheezing or shortness of breath. 1 Inhaler 0  . albuterol (PROVENTIL) (2.5 MG/3ML) 0.083% nebulizer solution Take 2.5 mg by nebulization every 6 (six) hours as needed for wheezing or shortness  of breath.    [name] amLODipine (NORVASC) 10 MG tablet Take 1 tablet (10 mg total) by mouth daily. 30 tablet 0  . aspirin EC 81 MG tablet Take 81 mg by mouth daily.     [name] atorvastatin (LIPITOR) 20 MG tablet Take 1 tablet (20 mg total) by mouth daily at 6 PM. 30 tablet 0  . budesonide (PULMICORT) 0.25 MG/2ML nebulizer solution Take 2 mLs (0.25 mg total) by nebulization 2 (two)  times daily. 60 mL 12  . citalopram (CELEXA) 40 MG tablet Take 40 mg by mouth daily.    [name] guaiFENesin-dextromethorphan (ROBITUSSIN DM) 100-10 MG/5ML syrup Take 5 mLs by mouth every 4 (four) hours as needed for cough. 118 mL 0  . hydrALAZINE (APRESOLINE) 100 MG tablet Take 1 tablet (100 mg total) by mouth every 8 (eight) hours. 60 tablet 0  . insulin aspart (NOVOLOG FLEXPEN) 100 UNIT/ML FlexPen Inject 0-12 Units into the skin 3 (three) times daily with meals as needed for high blood sugar. Pt uses as needed per sliding scale.    . Insulin Degludec (TRESIBA FLEXTOUCH) 100 UNIT/ML SOPN Inject 45 Units into the skin at bedtime. 1 pen 2  . metolazone (ZAROXOLYN) 5 MG tablet Take 1 tablet (5 mg total) by mouth daily. 20 tablet 0  . metoprolol 75 MG TABS Take 75 mg by mouth 2 (two) times daily. 60 tablet 0  . polyethylene glycol (MIRALAX / GLYCOLAX) packet Take 17 g by mouth daily as needed for mild constipation. 14 each 0  . potassium chloride (K-DUR,KLOR-CON) 10 MEQ tablet Take 1 tablet (10 mEq total) by mouth 2 (two) times daily. Can take an additional tablet when takes the metolazone 80 tablet 5  . predniSONE (DELTASONE) 20 MG tablet Take 1 tablet (20 mg total) by mouth daily with breakfast. X 7 days 7 tablet 0  . simvastatin (ZOCOR) 40 MG tablet Take 40 mg by mouth at bedtime.    . torsemide (DEMADEX) 20 MG tablet Take 2 tablets (40 mg total) by mouth daily. 60 tablet 5   No current facility-administered medications for this visit.     OBJECTIVE: Vitals:   08/22/16 0911  BP: 138/84  Pulse: 72  Resp: 20  Temp: 97.2 F (36.2 C)     Body mass index is 56.82 kg/m.    ECOG FS:1 - Symptomatic but completely ambulatory  General: Well-developed, well-nourished, no acute distress. Eyes: Pink conjunctiva, anicteric sclera. Lungs: Clear to auscultation bilaterally. Heart: Regular rate and rhythm. No rubs, murmurs, or gallops. Abdomen: Soft, nontender, nondistended. No organomegaly noted,  normoactive bowel sounds. Musculoskeletal: 2 + pitting edema in BLE and Bilateral Wrists and elbows. No cyanosis, or clubbing. Neuro: Alert, answering all questions appropriately. Cranial nerves grossly intact. Skin: No rashes or petechiae noted. Psych: Normal affect.   LAB RESULTS:  Lab Results  Component Value Date   NA 134 (L) 06/29/2016   K 4.2 06/29/2016   CL 88 (L) 06/29/2016   CO2 39 (H) 06/29/2016   GLUCOSE 297 (H) 06/29/2016   BUN 61 (H) 06/29/2016   CREATININE 1.90 (H) 06/29/2016   CALCIUM 8.2 (L) 06/29/2016   PROT 6.1 (L) 03/23/2016   ALBUMIN 2.7 (L) 06/27/2016   AST 19 03/23/2016   ALT 12 (L) 03/23/2016   ALKPHOS 118 03/23/2016   BILITOT 0.7 03/23/2016   GFRNONAA 31 (L) 06/29/2016   GFRAA 36 (L) 06/29/2016    Lab Results  Component Value Date   WBC 5.9 08/22/2016   NEUTROABS 4.5  08/22/2016   HGB 9.9 (L) 08/22/2016   HCT 29.7 (L) 08/22/2016   MCV 83.6 08/22/2016   PLT 349 08/22/2016   Lab Results  Component Value Date   IRON 50 08/22/2016   TIBC 290 08/22/2016   IRONPCTSAT 17 08/22/2016    Lab Results  Component Value Date   FERRITIN 40 08/22/2016     STUDIES: No results found.  ASSESSMENT: Iron deficiency anemia.  PLAN:    1. Iron deficiency anemia: Likely secondary to heavy menses. [name] hemoglobin is decreased, but her iron stores are within normal limits. Give one dose of IV feraheme. She will not need second dose next week. Previously, patient had a mild allergic reaction to Feraheme therefore in addition to her treatment she will receive 20 mg of dexamethasone, 25 mg Benadryl, 50 mg ranitidine, and 650 mg of Tylenol.  No intervention is needed at this time. Return to clinic in 3 months for repeat laboratory work and further evaluation. 2. Shortness of Breath: Continue oxygen as prescribed. 3. Weight gain: 26 lb weight gain in one month. +2 pitting edema in bilateral lower extremities and bilateral wrists and elbows. She recently  finished a prednisone taper from her pulmonologist. Encouraged her to continue taking her torsemide 20 mg twice a day. Encouraged her to follow up with her cardiologist or PCP. Also spoke about checking her weight daily.  Patient expressed understanding and was in agreement with this plan. She also understands that She can call clinic at any time with any questions, concerns, or complaints.   Faythe Casa, NP  Patient was seen and evaluated independently and I agree with the assessment and plan as dictated above. Also given patient's renal insufficiency, she may benefit from Procrit in the future.  Lloyd Huger, MD 08/25/16 7:37 AM

## 2016-08-22 ENCOUNTER — Inpatient Hospital Stay: Payer: Medicaid Other | Attending: Oncology

## 2016-08-22 ENCOUNTER — Inpatient Hospital Stay: Payer: Medicaid Other

## 2016-08-22 ENCOUNTER — Inpatient Hospital Stay (HOSPITAL_BASED_OUTPATIENT_CLINIC_OR_DEPARTMENT_OTHER): Payer: Medicaid Other | Admitting: Oncology

## 2016-08-22 VITALS — BP 134/86 | HR 76

## 2016-08-22 VITALS — BP 138/84 | HR 72 | Temp 97.2°F | Resp 20 | Wt 331.0 lb

## 2016-08-22 DIAGNOSIS — Z794 Long term (current) use of insulin: Secondary | ICD-10-CM | POA: Insufficient documentation

## 2016-08-22 DIAGNOSIS — Z7982 Long term (current) use of aspirin: Secondary | ICD-10-CM | POA: Diagnosis not present

## 2016-08-22 DIAGNOSIS — Z803 Family history of malignant neoplasm of breast: Secondary | ICD-10-CM | POA: Diagnosis not present

## 2016-08-22 DIAGNOSIS — D500 Iron deficiency anemia secondary to blood loss (chronic): Secondary | ICD-10-CM

## 2016-08-22 DIAGNOSIS — Z9981 Dependence on supplemental oxygen: Secondary | ICD-10-CM | POA: Diagnosis not present

## 2016-08-22 DIAGNOSIS — I10 Essential (primary) hypertension: Secondary | ICD-10-CM

## 2016-08-22 DIAGNOSIS — R0602 Shortness of breath: Secondary | ICD-10-CM | POA: Diagnosis not present

## 2016-08-22 DIAGNOSIS — R5383 Other fatigue: Secondary | ICD-10-CM | POA: Diagnosis not present

## 2016-08-22 DIAGNOSIS — R635 Abnormal weight gain: Secondary | ICD-10-CM | POA: Insufficient documentation

## 2016-08-22 DIAGNOSIS — E1165 Type 2 diabetes mellitus with hyperglycemia: Secondary | ICD-10-CM | POA: Insufficient documentation

## 2016-08-22 DIAGNOSIS — D509 Iron deficiency anemia, unspecified: Secondary | ICD-10-CM | POA: Diagnosis not present

## 2016-08-22 DIAGNOSIS — R531 Weakness: Secondary | ICD-10-CM | POA: Diagnosis not present

## 2016-08-22 DIAGNOSIS — I509 Heart failure, unspecified: Secondary | ICD-10-CM | POA: Diagnosis not present

## 2016-08-22 DIAGNOSIS — J449 Chronic obstructive pulmonary disease, unspecified: Secondary | ICD-10-CM | POA: Diagnosis not present

## 2016-08-22 LAB — IRON AND TIBC
Iron: 50 ug/dL (ref 28–170)
SATURATION RATIOS: 17 % (ref 10.4–31.8)
TIBC: 290 ug/dL (ref 250–450)
UIBC: 240 ug/dL

## 2016-08-22 LAB — CBC WITH DIFFERENTIAL/PLATELET
BASOS ABS: 0.1 10*3/uL (ref 0–0.1)
BASOS PCT: 1 %
EOS ABS: 0.1 10*3/uL (ref 0–0.7)
EOS PCT: 1 %
HCT: 29.7 % — ABNORMAL LOW (ref 35.0–47.0)
Hemoglobin: 9.9 g/dL — ABNORMAL LOW (ref 12.0–16.0)
Lymphocytes Relative: 13 %
Lymphs Abs: 0.7 10*3/uL — ABNORMAL LOW (ref 1.0–3.6)
MCH: 27.9 pg (ref 26.0–34.0)
MCHC: 33.4 g/dL (ref 32.0–36.0)
MCV: 83.6 fL (ref 80.0–100.0)
MONO ABS: 0.5 10*3/uL (ref 0.2–0.9)
MONOS PCT: 9 %
Neutro Abs: 4.5 10*3/uL (ref 1.4–6.5)
Neutrophils Relative %: 76 %
PLATELETS: 349 10*3/uL (ref 150–440)
RBC: 3.55 MIL/uL — ABNORMAL LOW (ref 3.80–5.20)
RDW: 14.6 % — AB (ref 11.5–14.5)
WBC: 5.9 10*3/uL (ref 3.6–11.0)

## 2016-08-22 LAB — FERRITIN: FERRITIN: 40 ng/mL (ref 11–307)

## 2016-08-22 MED ORDER — SODIUM CHLORIDE 0.9 % IV SOLN
50.0000 mg | Freq: Once | INTRAVENOUS | Status: DC
  Filled 2016-08-22: qty 2

## 2016-08-22 MED ORDER — ACETAMINOPHEN 325 MG PO TABS
650.0000 mg | ORAL_TABLET | Freq: Four times a day (QID) | ORAL | Status: DC | PRN
  Administered 2016-08-22: 650 mg via ORAL
  Filled 2016-08-22: qty 2

## 2016-08-22 MED ORDER — SODIUM CHLORIDE 0.9 % IV SOLN
20.0000 mg | Freq: Once | INTRAVENOUS | Status: AC
  Administered 2016-08-22: 20 mg via INTRAVENOUS
  Filled 2016-08-22: qty 2

## 2016-08-22 MED ORDER — FAMOTIDINE IN NACL 20-0.9 MG/50ML-% IV SOLN
20.0000 mg | Freq: Two times a day (BID) | INTRAVENOUS | Status: DC
  Administered 2016-08-22: 20 mg via INTRAVENOUS
  Filled 2016-08-22: qty 50

## 2016-08-22 MED ORDER — DIPHENHYDRAMINE HCL 50 MG/ML IJ SOLN
25.0000 mg | Freq: Once | INTRAMUSCULAR | Status: AC
  Administered 2016-08-22: 25 mg via INTRAVENOUS
  Filled 2016-08-22: qty 1

## 2016-08-22 MED ORDER — SODIUM CHLORIDE 0.9 % IV SOLN
Freq: Once | INTRAVENOUS | Status: AC
  Administered 2016-08-22: 10:00:00 via INTRAVENOUS
  Filled 2016-08-22: qty 1000

## 2016-08-22 MED ORDER — SODIUM CHLORIDE 0.9 % IV SOLN
510.0000 mg | Freq: Once | INTRAVENOUS | Status: AC
  Administered 2016-08-22: 510 mg via INTRAVENOUS
  Filled 2016-08-22: qty 17

## 2016-08-22 NOTE — Progress Notes (Signed)
Patient denies any concerns today.  

## 2016-08-26 ENCOUNTER — Telehealth: Payer: Self-pay | Admitting: *Deleted

## 2016-08-26 NOTE — Telephone Encounter (Signed)
Patient called asking for her lab results from last week. Please return her call with any instructions or advice [phone]

## 2016-08-26 NOTE — Telephone Encounter (Signed)
Iron stores look okay. She does not need a second infusion. Return to clinic as scheduled.

## 2016-08-26 NOTE — Telephone Encounter (Signed)
Patient advised no need for another ioron transfusion and Iroon levels are good

## 2016-09-09 ENCOUNTER — Ambulatory Visit: Payer: Medicaid Other | Admitting: Family

## 2016-09-23 ENCOUNTER — Encounter: Payer: Self-pay | Admitting: Family

## 2016-09-23 ENCOUNTER — Ambulatory Visit: Payer: Medicaid Other | Attending: Family | Admitting: Family

## 2016-09-23 VITALS — BP 160/90 | HR 92 | Resp 18 | Ht 64.0 in | Wt 318.5 lb

## 2016-09-23 DIAGNOSIS — Z888 Allergy status to other drugs, medicaments and biological substances status: Secondary | ICD-10-CM | POA: Insufficient documentation

## 2016-09-23 DIAGNOSIS — I5032 Chronic diastolic (congestive) heart failure: Secondary | ICD-10-CM

## 2016-09-23 DIAGNOSIS — I10 Essential (primary) hypertension: Secondary | ICD-10-CM

## 2016-09-23 DIAGNOSIS — Z6841 Body Mass Index (BMI) 40.0 and over, adult: Secondary | ICD-10-CM | POA: Diagnosis not present

## 2016-09-23 DIAGNOSIS — I509 Heart failure, unspecified: Secondary | ICD-10-CM | POA: Diagnosis present

## 2016-09-23 DIAGNOSIS — J449 Chronic obstructive pulmonary disease, unspecified: Secondary | ICD-10-CM | POA: Diagnosis not present

## 2016-09-23 DIAGNOSIS — Z9981 Dependence on supplemental oxygen: Secondary | ICD-10-CM | POA: Insufficient documentation

## 2016-09-23 DIAGNOSIS — Z794 Long term (current) use of insulin: Secondary | ICD-10-CM | POA: Diagnosis not present

## 2016-09-23 DIAGNOSIS — Z79899 Other long term (current) drug therapy: Secondary | ICD-10-CM | POA: Diagnosis not present

## 2016-09-23 DIAGNOSIS — E669 Obesity, unspecified: Secondary | ICD-10-CM | POA: Diagnosis not present

## 2016-09-23 DIAGNOSIS — Z7982 Long term (current) use of aspirin: Secondary | ICD-10-CM | POA: Diagnosis not present

## 2016-09-23 DIAGNOSIS — E119 Type 2 diabetes mellitus without complications: Secondary | ICD-10-CM | POA: Insufficient documentation

## 2016-09-23 DIAGNOSIS — Z7951 Long term (current) use of inhaled steroids: Secondary | ICD-10-CM | POA: Insufficient documentation

## 2016-09-23 DIAGNOSIS — Z8249 Family history of ischemic heart disease and other diseases of the circulatory system: Secondary | ICD-10-CM | POA: Diagnosis not present

## 2016-09-23 DIAGNOSIS — Z833 Family history of diabetes mellitus: Secondary | ICD-10-CM | POA: Diagnosis not present

## 2016-09-23 DIAGNOSIS — F329 Major depressive disorder, single episode, unspecified: Secondary | ICD-10-CM | POA: Diagnosis not present

## 2016-09-23 DIAGNOSIS — Z882 Allergy status to sulfonamides status: Secondary | ICD-10-CM | POA: Insufficient documentation

## 2016-09-23 DIAGNOSIS — D500 Iron deficiency anemia secondary to blood loss (chronic): Secondary | ICD-10-CM

## 2016-09-23 DIAGNOSIS — I110 Hypertensive heart disease with heart failure: Secondary | ICD-10-CM | POA: Insufficient documentation

## 2016-09-23 DIAGNOSIS — D509 Iron deficiency anemia, unspecified: Secondary | ICD-10-CM | POA: Diagnosis not present

## 2016-09-23 MED ORDER — TORSEMIDE 20 MG PO TABS: 40.0000 mg | ORAL_TABLET | Freq: Every day | ORAL | 5 refills | Status: DC

## 2016-09-23 MED ORDER — ALBUTEROL SULFATE HFA 108 (90 BASE) MCG/ACT IN AERS: 2.0000 | INHALATION_SPRAY | Freq: Four times a day (QID) | RESPIRATORY_TRACT | 5 refills | Status: DC | PRN

## 2016-09-23 NOTE — Progress Notes (Signed)
Patient ID: [name], female    DOB: December 10, 1970, 46 y.o.   MRN: [id number]  HPI  Ms Spinola is a 46 y/o female with a history of DM, iron deficiency anemia, HTN, COPD with chronic oxygen use, asthma, obesity and chronic heart failure.   Reviewed last echo done 06/27/16 which showed an EF of 60-65%. Previous echo was done 12/15/14 and showed an EF of 50-55% without any valvular regurgitation.   Admitted 06/25/16 with HF/COPD exacerbation. Initially treated with IV diuretics and transitioned to oral diuretics. Cardiology consult obtained. Also needed IV steroids initially along with nebulizer treatment. Nephrology consult also obtained. Discharged home after 4 days. Was in the ED on 03/23/16 with exacerbation of HF. Was treated and released. Prior ED visit was 02/23/16 for acute cystitis.  She presents today for a follow-up visit with a chief complaint of moderate shortness of breath with little exertion. She describes this as chronic in nature having been present for several years with varying levels of severity. She has associated fatigue, cough, wheezing and weakness associated along with this.   Past Medical History:  Diagnosis Date  . Asthma   . CHF (congestive heart failure) ([place])   . COPD (chronic obstructive pulmonary disease) ([place])   . Diabetes mellitus without complication ([place])   . Hypertension   . Iron deficiency anemia   . Pericardial effusion    a. 11/2014 CT Chest: Mod effusion.  [name] Poorly controlled diabetes mellitus ([place])    a. 11/2014 A1c 13.2.   Past Surgical History:  Procedure Laterality Date  . CESAREAN SECTION     Family History  Problem Relation Age of Onset  . Diabetes Mother   . Hypertension Mother   . Hypertension Father   . Diabetes Father   . Hypertension Unknown   . Diabetes Unknown   . Breast cancer Maternal Aunt 19  . Breast cancer Maternal Aunt 24   Social History  Substance Use Topics  . Smoking status: Never Smoker  . Smokeless tobacco:  Never Used  . Alcohol use 0.0 oz/week     Comment: occ   Allergies  Allergen Reactions  . Feraheme [Ferumoxytol] Itching  . Pollen Extract   . Sulfa Antibiotics Itching   Prior to Admission medications   Medication Sig Start Date End Date Taking? Authorizing Provider  acetaminophen (TYLENOL) 500 MG tablet Take 1,000 mg by mouth every 6 (six) hours as needed for mild pain, fever or headache.   Yes [provider]  albuterol (PROVENTIL HFA;VENTOLIN HFA) 108 (90 Base) MCG/ACT inhaler Inhale 2 puffs into the lungs every 6 (six) hours as needed for wheezing or shortness of breath. 09/23/16  Yes Freddie Nghiem, Otila Kluver A, FNP  albuterol (PROVENTIL) (2.5 MG/3ML) 0.083% nebulizer solution Take 2.5 mg by nebulization every 6 (six) hours as needed for wheezing or shortness of breath.   Yes [provider]  amLODipine (NORVASC) 10 MG tablet Take 1 tablet (10 mg total) by mouth daily. 12/19/14  Yes Dustin Flock, MD  aspirin EC 81 MG tablet Take 81 mg by mouth daily.    Yes [provider]  atorvastatin (LIPITOR) 20 MG tablet Take 1 tablet (20 mg total) by mouth daily at 6 PM. 06/29/16  Yes Vaughan Basta, MD  budesonide (PULMICORT) 0.25 MG/2ML nebulizer solution Take 2 mLs (0.25 mg total) by nebulization 2 (two) times daily. 12/19/14  Yes Dustin Flock, MD  citalopram (CELEXA) 40 MG tablet Take 40 mg by mouth daily.   Yes [provider]  guaiFENesin-dextromethorphan (ROBITUSSIN DM) 100-10 MG/5ML syrup Take 5 mLs by mouth every 4 (four) hours as needed for cough. 08/13/15  Yes Demetrios Loll, MD  hydrALAZINE (APRESOLINE) 100 MG tablet Take 1 tablet (100 mg total) by mouth every 8 (eight) hours. 12/19/14  Yes Dustin Flock, MD  insulin aspart (NOVOLOG FLEXPEN) 100 UNIT/ML FlexPen Inject 0-12 Units into the skin 3 (three) times daily with meals as needed for high blood sugar. Pt uses as needed per sliding scale.   Yes [provider]  Insulin Degludec (TRESIBA  FLEXTOUCH) 100 UNIT/ML SOPN Inject 45 Units into the skin at bedtime. 08/13/15  Yes Demetrios Loll, MD  metoprolol 75 MG TABS Take 75 mg by mouth 2 (two) times daily. 06/29/16  Yes Vaughan Basta, MD  polyethylene glycol (MIRALAX / GLYCOLAX) packet Take 17 g by mouth daily as needed for mild constipation. 06/29/16  Yes Vaughan Basta, MD  potassium chloride (K-DUR,KLOR-CON) 10 MEQ tablet Take 1 tablet (10 mEq total) by mouth 2 (two) times daily. Can take an additional tablet when takes the metolazone 04/12/16  Yes [name] A, FNP  simvastatin (ZOCOR) 40 MG tablet Take 40 mg by mouth at bedtime.   Yes [provider]  torsemide (DEMADEX) 20 MG tablet Take 2 tablets (40 mg total) by mouth daily. 09/23/16  Yes Alisa Graff, FNP    Review of Systems  Constitutional: Positive for appetite change and fatigue.  HENT: Negative for congestion, postnasal drip and sinus pressure.   Eyes: Negative.   Respiratory: Positive for cough (thick cough), shortness of breath and wheezing. Negative for chest tightness.   Cardiovascular: Negative for chest pain, palpitations and leg swelling.  Gastrointestinal: Negative for abdominal distention and abdominal pain.  Endocrine: Negative.   Genitourinary: Negative.   Musculoskeletal: Negative for back pain and neck pain.  Skin: Negative.   Allergic/Immunologic: Negative.   Neurological: Positive for weakness (improving). Negative for dizziness and light-headedness.  Hematological: Negative for adenopathy. Does not bruise/bleed easily.  Psychiatric/Behavioral: Positive for dysphoric mood. Negative for sleep disturbance and suicidal ideas. The patient is nervous/anxious.    Vitals:   09/23/16 1441  BP: (!) 160/90  Pulse: 92  Resp: 18  SpO2: 97%  Weight: (!) 318 lb 8 oz (144.5 kg)  Height: 5\' 4"  (1.626 m)   Wt Readings from Last 3 Encounters:  09/23/16 (!) 318 lb 8 oz (144.5 kg)  08/22/16 (!) 331 lb (150.1 kg)  07/17/16 (!) 305 lb (138.3  kg)    Lab Results  Component Value Date   CREATININE 1.90 (H) 06/29/2016   CREATININE 2.07 (H) 06/28/2016   CREATININE 2.14 (H) 06/27/2016    Physical Exam  Constitutional: She is oriented to person, place, and time. She appears well-developed and well-nourished.  HENT:  Head: Normocephalic and atraumatic.  Neck: Normal range of motion. Neck supple. No JVD present.  Cardiovascular: Normal rate and regular rhythm.   Pulmonary/Chest: Effort normal. She has no wheezes. She has no rales.  Abdominal: She exhibits no distension. There is no tenderness.  Musculoskeletal: She exhibits no edema or tenderness.  Neurological: She is alert and oriented to person, place, and time.  Skin: Skin is warm and dry.  Psychiatric: She has a normal mood and affect. Her behavior is normal. Thought content normal.  Nursing note and vitals reviewed.   Assessment & Plan:  1: Chronic heart failure with preserved ejection fraction- - NYHA class III - euvolemic today - weight down 8 pounds since  she was last here. Continue weighing daily and call for a weight gain of >2 pounds, weekly weight gain of > 5 pounds, worsening swelling or worsening shortness of breath - continue daily torsemide - BMP done 06/29/16 reviewed and shows potassium 4.2 with GFR 36. - not adding salt and trying to follow a 2000mg  sodium diet - encouraged increased activty - last saw cardiologist Rockey Situ) 12/25/15.  - does not meet ReDS vest criteria due to BMI  2: COPD- - saw pulmonologist Mortimer Fries) 07/17/16 - needs to resume pulmonary rehab to increase her endurance, increase muscle strength and begin to lose weight - wears oxygen on a continuous setting of 3L around the clock  3: HTN- - BP slightly elevated today - saw PCP on 04/29/16 - saw nephrologist (Kolluru) in the last 2 weeks  4: Anemia- - saw hematologist Grayland Ormond) 08/22/16 and received a dose of feraheme  5: Depression-  - nephrologist advised patient to contact  behavioral medicine about seeing a counselor regarding her depression - she says that she doesn't think she needs to do so but says that she will call them  Patient did not bring her medications nor a list. Each medication was verbally reviewed with the patient and she was encouraged to bring the bottles to every visit to confirm accuracy of list.  Return in 3 months or sooner for any questions/problems before then.

## 2016-09-23 NOTE — Patient Instructions (Signed)
Continue weighing daily and call for an overnight weight gain of > 2 pounds or a weekly weight gain of >5 pounds. 

## 2016-09-24 DIAGNOSIS — F32A Depression, unspecified: Secondary | ICD-10-CM | POA: Insufficient documentation

## 2016-09-24 DIAGNOSIS — F329 Major depressive disorder, single episode, unspecified: Secondary | ICD-10-CM | POA: Insufficient documentation

## 2016-10-21 ENCOUNTER — Other Ambulatory Visit: Payer: Medicaid Other

## 2016-10-21 ENCOUNTER — Ambulatory Visit: Payer: Medicaid Other

## 2016-11-07 ENCOUNTER — Inpatient Hospital Stay
Admission: EM | Admit: 2016-11-07 | Discharge: 2016-11-16 | DRG: 291 | Disposition: A | Discharge status: Home Health | Payer: Medicaid Other | Attending: Internal Medicine | Admitting: Internal Medicine

## 2016-11-07 ENCOUNTER — Emergency Department: Payer: Medicaid Other

## 2016-11-07 ENCOUNTER — Other Ambulatory Visit: Payer: Self-pay

## 2016-11-07 ENCOUNTER — Encounter: Payer: Self-pay | Admitting: Emergency Medicine

## 2016-11-07 DIAGNOSIS — Z6841 Body Mass Index (BMI) 40.0 and over, adult: Secondary | ICD-10-CM | POA: Diagnosis not present

## 2016-11-07 DIAGNOSIS — R079 Chest pain, unspecified: Secondary | ICD-10-CM | POA: Diagnosis present

## 2016-11-07 DIAGNOSIS — N189 Chronic kidney disease, unspecified: Secondary | ICD-10-CM | POA: Diagnosis not present

## 2016-11-07 DIAGNOSIS — E785 Hyperlipidemia, unspecified: Secondary | ICD-10-CM | POA: Diagnosis present

## 2016-11-07 DIAGNOSIS — I509 Heart failure, unspecified: Secondary | ICD-10-CM

## 2016-11-07 DIAGNOSIS — F329 Major depressive disorder, single episode, unspecified: Secondary | ICD-10-CM | POA: Diagnosis present

## 2016-11-07 DIAGNOSIS — E1122 Type 2 diabetes mellitus with diabetic chronic kidney disease: Secondary | ICD-10-CM | POA: Diagnosis present

## 2016-11-07 DIAGNOSIS — D500 Iron deficiency anemia secondary to blood loss (chronic): Secondary | ICD-10-CM | POA: Diagnosis present

## 2016-11-07 DIAGNOSIS — M549 Dorsalgia, unspecified: Secondary | ICD-10-CM | POA: Diagnosis present

## 2016-11-07 DIAGNOSIS — Z8249 Family history of ischemic heart disease and other diseases of the circulatory system: Secondary | ICD-10-CM

## 2016-11-07 DIAGNOSIS — N183 Chronic kidney disease, stage 3 (moderate): Secondary | ICD-10-CM | POA: Diagnosis present

## 2016-11-07 DIAGNOSIS — I5033 Acute on chronic diastolic (congestive) heart failure: Secondary | ICD-10-CM | POA: Diagnosis present

## 2016-11-07 DIAGNOSIS — E1165 Type 2 diabetes mellitus with hyperglycemia: Secondary | ICD-10-CM | POA: Diagnosis present

## 2016-11-07 DIAGNOSIS — I340 Nonrheumatic mitral (valve) insufficiency: Secondary | ICD-10-CM | POA: Diagnosis present

## 2016-11-07 DIAGNOSIS — Z79899 Other long term (current) drug therapy: Secondary | ICD-10-CM

## 2016-11-07 DIAGNOSIS — Z7982 Long term (current) use of aspirin: Secondary | ICD-10-CM

## 2016-11-07 DIAGNOSIS — Z9981 Dependence on supplemental oxygen: Secondary | ICD-10-CM | POA: Diagnosis not present

## 2016-11-07 DIAGNOSIS — N179 Acute kidney failure, unspecified: Secondary | ICD-10-CM | POA: Diagnosis present

## 2016-11-07 DIAGNOSIS — G8929 Other chronic pain: Secondary | ICD-10-CM | POA: Diagnosis present

## 2016-11-07 DIAGNOSIS — I248 Other forms of acute ischemic heart disease: Secondary | ICD-10-CM | POA: Diagnosis present

## 2016-11-07 DIAGNOSIS — I161 Hypertensive emergency: Secondary | ICD-10-CM | POA: Diagnosis present

## 2016-11-07 DIAGNOSIS — K5900 Constipation, unspecified: Secondary | ICD-10-CM | POA: Diagnosis not present

## 2016-11-07 DIAGNOSIS — E1121 Type 2 diabetes mellitus with diabetic nephropathy: Secondary | ICD-10-CM | POA: Diagnosis present

## 2016-11-07 DIAGNOSIS — I130 Hypertensive heart and chronic kidney disease with heart failure and stage 1 through stage 4 chronic kidney disease, or unspecified chronic kidney disease: Principal | ICD-10-CM | POA: Diagnosis present

## 2016-11-07 DIAGNOSIS — Z888 Allergy status to other drugs, medicaments and biological substances status: Secondary | ICD-10-CM

## 2016-11-07 DIAGNOSIS — D509 Iron deficiency anemia, unspecified: Secondary | ICD-10-CM | POA: Diagnosis present

## 2016-11-07 DIAGNOSIS — Z23 Encounter for immunization: Secondary | ICD-10-CM

## 2016-11-07 DIAGNOSIS — J449 Chronic obstructive pulmonary disease, unspecified: Secondary | ICD-10-CM | POA: Diagnosis present

## 2016-11-07 DIAGNOSIS — Z833 Family history of diabetes mellitus: Secondary | ICD-10-CM

## 2016-11-07 DIAGNOSIS — E662 Morbid (severe) obesity with alveolar hypoventilation: Secondary | ICD-10-CM | POA: Diagnosis present

## 2016-11-07 DIAGNOSIS — Z91120 Patient's intentional underdosing of medication regimen due to financial hardship: Secondary | ICD-10-CM

## 2016-11-07 DIAGNOSIS — E869 Volume depletion, unspecified: Secondary | ICD-10-CM | POA: Diagnosis present

## 2016-11-07 DIAGNOSIS — J9621 Acute and chronic respiratory failure with hypoxia: Secondary | ICD-10-CM | POA: Diagnosis present

## 2016-11-07 DIAGNOSIS — R0600 Dyspnea, unspecified: Secondary | ICD-10-CM | POA: Diagnosis not present

## 2016-11-07 DIAGNOSIS — R0603 Acute respiratory distress: Secondary | ICD-10-CM | POA: Diagnosis not present

## 2016-11-07 DIAGNOSIS — Z7951 Long term (current) use of inhaled steroids: Secondary | ICD-10-CM

## 2016-11-07 DIAGNOSIS — N920 Excessive and frequent menstruation with regular cycle: Secondary | ICD-10-CM | POA: Diagnosis present

## 2016-11-07 DIAGNOSIS — R0789 Other chest pain: Secondary | ICD-10-CM | POA: Diagnosis not present

## 2016-11-07 DIAGNOSIS — I10 Essential (primary) hypertension: Secondary | ICD-10-CM | POA: Diagnosis not present

## 2016-11-07 DIAGNOSIS — I50813 Acute on chronic right heart failure: Secondary | ICD-10-CM

## 2016-11-07 DIAGNOSIS — Z794 Long term (current) use of insulin: Secondary | ICD-10-CM

## 2016-11-07 DIAGNOSIS — Z882 Allergy status to sulfonamides status: Secondary | ICD-10-CM

## 2016-11-07 HISTORY — DX: Chronic kidney disease, stage 3 unspecified: N18.30

## 2016-11-07 HISTORY — DX: Unspecified diastolic (congestive) heart failure: I50.30

## 2016-11-07 HISTORY — DX: Chronic kidney disease, stage 3 (moderate): N18.3

## 2016-11-07 HISTORY — DX: Morbid (severe) obesity due to excess calories: E66.01

## 2016-11-07 LAB — TROPONIN I: Troponin I: 0.06 ng/mL (ref <0.03)

## 2016-11-07 LAB — BASIC METABOLIC PANEL
Anion gap: 8 (ref 5–15)
BUN: 20 mg/dL (ref 6–20)
CHLORIDE: 95 mmol/L — AB (ref 101–111)
CO2: 37 mmol/L — AB (ref 22–32)
Calcium: 8.6 mg/dL — ABNORMAL LOW (ref 8.9–10.3)
Creatinine, Ser: 1.53 mg/dL — ABNORMAL HIGH (ref 0.44–1.00)
GFR calc non Af Amer: 40 mL/min — ABNORMAL LOW (ref >60)
GFR, EST AFRICAN AMERICAN: 46 mL/min — AB (ref >60)
Glucose, Bld: 195 mg/dL — ABNORMAL HIGH (ref 65–99)
POTASSIUM: 3.9 mmol/L (ref 3.5–5.1)
Sodium: 140 mmol/L (ref 135–145)

## 2016-11-07 LAB — CBC
HEMATOCRIT: 34.3 % — AB (ref 35.0–47.0)
Hemoglobin: 11.3 g/dL — ABNORMAL LOW (ref 12.0–16.0)
MCH: 28.5 pg (ref 26.0–34.0)
MCHC: 32.9 g/dL (ref 32.0–36.0)
MCV: 86.7 fL (ref 80.0–100.0)
Platelets: 291 10*3/uL (ref 150–440)
RBC: 3.96 MIL/uL (ref 3.80–5.20)
RDW: 15.8 % — AB (ref 11.5–14.5)
WBC: 5.7 10*3/uL (ref 3.6–11.0)

## 2016-11-07 LAB — BRAIN NATRIURETIC PEPTIDE: B NATRIURETIC PEPTIDE 5: 369 pg/mL — AB (ref 0.0–100.0)

## 2016-11-07 LAB — GLUCOSE, CAPILLARY: Glucose-Capillary: 162 mg/dL — ABNORMAL HIGH (ref 65–99)

## 2016-11-07 IMAGING — DX DG CHEST 1V PORT
1 series · 1 of 1 positions shown · non-contrast
Comparison: [DATE]

CLINICAL DATA: Complaint of central chest pain radiating to the
left side of the chest and back x2 days.

EXAM:
PORTABLE CHEST 1 VIEW

[chest ap]
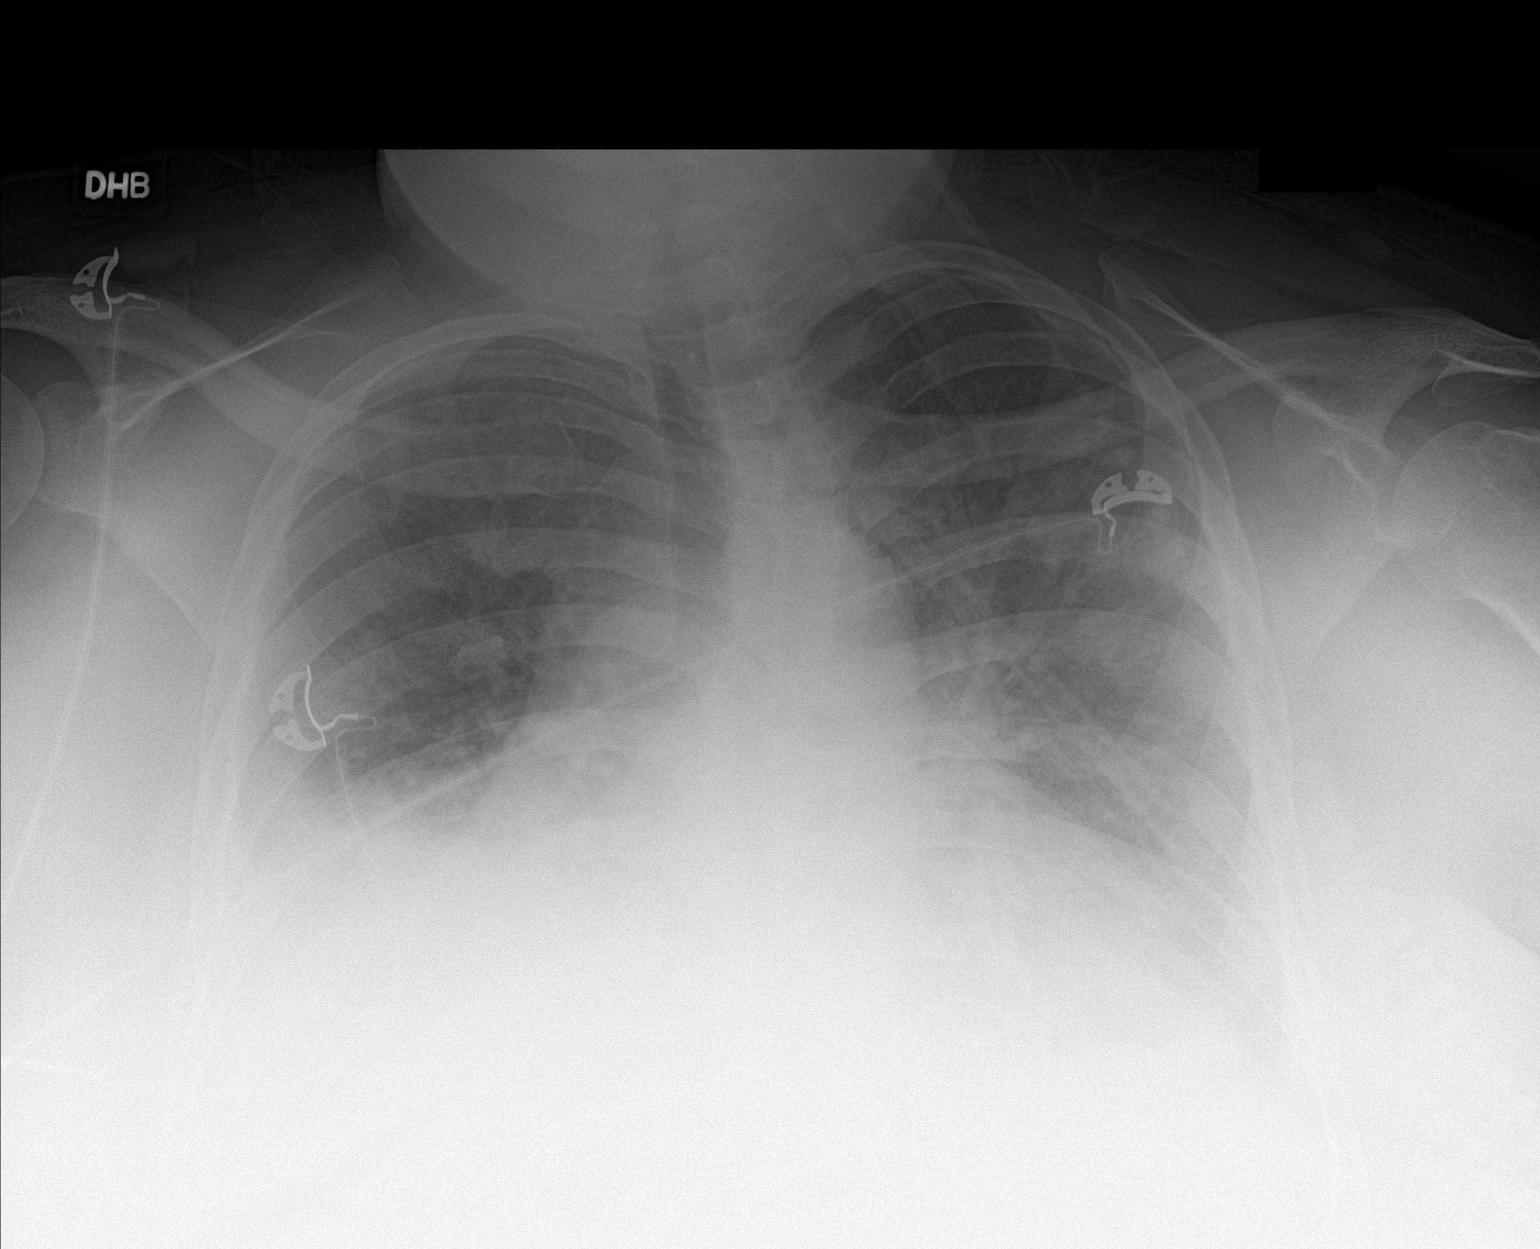

[1 of 1 positions shown; findings below may reference images not displayed]

FINDINGS: Low lung volumes with cardiomegaly, pulmonary vascular congestion
and right effusion. No acute osseous abnormality.
IMPRESSION: Low lung volumes are again noted with stable cardiomegaly and mild
vascular congestion consistent CHF. Probable small right effusion.

## 2016-11-07 MED ORDER — NITROGLYCERIN IN D5W 200-5 MCG/ML-% IV SOLN
100.0000 ug/min | Freq: Once | INTRAVENOUS | Status: AC
  Administered 2016-11-07: 100 ug/min via INTRAVENOUS
  Filled 2016-11-07: qty 250

## 2016-11-07 MED ORDER — FUROSEMIDE 10 MG/ML IJ SOLN
40.0000 mg | Freq: Once | INTRAMUSCULAR | Status: AC
  Administered 2016-11-07: 40 mg via INTRAVENOUS
  Filled 2016-11-07: qty 4

## 2016-11-07 MED ORDER — ACETAMINOPHEN 325 MG PO TABS
650.0000 mg | ORAL_TABLET | Freq: Once | ORAL | Status: AC
  Administered 2016-11-07: 650 mg via ORAL
  Filled 2016-11-07: qty 2

## 2016-11-07 NOTE — ED Provider Notes (Signed)
[facility] Emergency Department Provider Note ____________________________________________   First MD Initiated Contact with Patient 11/07/16 1958     (approximate)  I have reviewed the triage vital signs and the nursing notes.   HISTORY  Chief Complaint Chest Pain and Shortness of Breath  HPI limited by respiratory distress and BiPAP use  HPI [name] is a 46 y.o. female with a history of CHF who presents with shortness of breath for the last few days, gradual onset, worsening, associated with chest pressure and with lightheadedness. Patient reports cough but no fever.  Past Medical History:  Diagnosis Date  . Asthma   . CHF (congestive heart failure) ([place])   . COPD (chronic obstructive pulmonary disease) ([place])   . Diabetes mellitus without complication ([place])   . Hypertension   . Iron deficiency anemia   . Pericardial effusion    a. 11/2014 CT Chest: Mod effusion.  [name] Poorly controlled diabetes mellitus ([place])    a. 11/2014 A1c 13.2.    Patient Active Problem List   Diagnosis Date Noted  . Depression 09/24/2016  . Chronic diastolic heart failure ([place]) 07/04/2016  . COPD with chronic bronchitis ([place]) 07/04/2016  . CKD (chronic kidney disease), stage III 06/26/2016  . COPD exacerbation ([place]) 03/25/2016  . Anxiety 10/02/2015  . Asthma 08/30/2015  . Poorly controlled type 2 diabetes mellitus ([place]) 03/13/2015  . Iron deficiency anemia 02/17/2015  . Morbid obesity ([place])   . Shortness of breath   . Essential hypertension   . Hypertensive heart disease with congestive heart failure ([place]) 12/18/2014  . Diabetes ([place]) 12/15/2014    Past Surgical History:  Procedure Laterality Date  . CESAREAN SECTION     times 3    Prior to Admission medications   Medication Sig Start Date End Date Taking? Authorizing Provider  acetaminophen (TYLENOL) 500 MG tablet Take 1,000 mg by mouth every 6 (six) hours as needed for mild pain, fever or headache.     [provider]  albuterol (PROVENTIL HFA;VENTOLIN HFA) 108 (90 Base) MCG/ACT inhaler Inhale 2 puffs into the lungs every 6 (six) hours as needed for wheezing or shortness of breath. 09/23/16   Alisa Graff, FNP  albuterol (PROVENTIL) (2.5 MG/3ML) 0.083% nebulizer solution Take 2.5 mg by nebulization every 6 (six) hours as needed for wheezing or shortness of breath.    [provider]  amLODipine (NORVASC) 10 MG tablet Take 1 tablet (10 mg total) by mouth daily. 12/19/14   Dustin Flock, MD  aspirin EC 81 MG tablet Take 81 mg by mouth daily.     [provider]  atorvastatin (LIPITOR) 20 MG tablet Take 1 tablet (20 mg total) by mouth daily at 6 PM. 06/29/16   Vaughan Basta, MD  budesonide (PULMICORT) 0.25 MG/2ML nebulizer solution Take 2 mLs (0.25 mg total) by nebulization 2 (two) times daily. 12/19/14   Dustin Flock, MD  citalopram (CELEXA) 40 MG tablet Take 40 mg by mouth daily.    [provider]  guaiFENesin-dextromethorphan (ROBITUSSIN DM) 100-10 MG/5ML syrup Take 5 mLs by mouth every 4 (four) hours as needed for cough. 08/13/15   Demetrios Loll, MD  hydrALAZINE (APRESOLINE) 100 MG tablet Take 1 tablet (100 mg total) by mouth every 8 (eight) hours. 12/19/14   Dustin Flock, MD  insulin aspart (NOVOLOG FLEXPEN) 100 UNIT/ML FlexPen Inject 0-12 Units into the skin 3 (three) times daily with meals as needed for high blood sugar. Pt uses as needed per sliding  scale.    [provider]  Insulin Degludec (TRESIBA FLEXTOUCH) 100 UNIT/ML SOPN Inject 45 Units into the skin at bedtime. 08/13/15   Demetrios Loll, MD  metoprolol 75 MG TABS Take 75 mg by mouth 2 (two) times daily. 06/29/16   Vaughan Basta, MD  polyethylene glycol (MIRALAX / GLYCOLAX) packet Take 17 g by mouth daily as needed for mild constipation. 06/29/16   Vaughan Basta, MD  potassium chloride (K-DUR,KLOR-CON) 10 MEQ tablet Take 1 tablet (10 mEq total) by mouth 2 (two) times  daily. Can take an additional tablet when takes the metolazone 04/12/16   [name] A, FNP  simvastatin (ZOCOR) 40 MG tablet Take 40 mg by mouth at bedtime.    [provider]  torsemide (DEMADEX) 20 MG tablet Take 2 tablets (40 mg total) by mouth daily. 09/23/16   Alisa Graff, FNP    Allergies Feraheme [ferumoxytol]; Pollen extract; and Sulfa antibiotics  Family History  Problem Relation Age of Onset  . Diabetes Mother   . Hypertension Mother   . Hypertension Father   . Diabetes Father   . Hypertension Unknown   . Diabetes Unknown   . Breast cancer Maternal Aunt 14  . Breast cancer Maternal Aunt 50    Social History Social History  Substance Use Topics  . Smoking status: Never Smoker  . Smokeless tobacco: Never Used  . Alcohol use 0.0 oz/week     Comment: occ    Review of Systems Level V caveat: unable to obtain complete ROS due to resp distress and BiPAP use    ____________________________________________   PHYSICAL EXAM:  VITAL SIGNS: ED Triage Vitals  Enc Vitals Group     BP 11/07/16 1934 (!) 227/128     Pulse Rate 11/07/16 1934 96     Resp 11/07/16 1934 18     Temp 11/07/16 1934 99.2 F (37.3 C)     Temp Source 11/07/16 1934 Oral     SpO2 11/07/16 1934 (!) 87 %     Weight 11/07/16 1930 (!) 530 lb (240.4 kg)     Height 11/07/16 1930 5\' 4"  (1.626 m)     Head Circumference --      Peak Flow --      Pain Score 11/07/16 1930 7     Pain Loc --      Pain Edu? --      Excl. in [place]? --     Constitutional: Alert and oriented. Uncomfortable appearing.  Eyes: Conjunctivae are normal.  Head: Atraumatic. Nose: No congestion/rhinnorhea. Mouth/Throat: Mucous membranes are moist.   Neck: Normal range of motion.  Cardiovascular: Normal rate, regular rhythm. Grossly normal heart sounds.  Good peripheral circulation. Respiratory: Increased respiratory effort, mild resp distress on arrival.  Dec breath sounds bilat and mild rales to  bases. Gastrointestinal: Soft and nontender. No distention.  Genitourinary: No CVA tenderness. Musculoskeletal: 2+ bilateral lower extremity edema.  Extremities warm and well perfused.  Neurologic:  Normal speech and language. No gross focal neurologic deficits are appreciated.  Skin:  Skin is warm and dry. No rash noted. Psychiatric: Mood and affect are normal. Speech and behavior are normal.  ____________________________________________   LABS (all labs ordered are listed, but only abnormal results are displayed)  Labs Reviewed  BASIC METABOLIC PANEL - Abnormal; Notable for the following:       Result Value   Chloride 95 (*)    CO2 37 (*)    Glucose, Bld 195 (*)  Creatinine, Ser 1.53 (*)    Calcium 8.6 (*)    GFR calc non Af Amer 40 (*)    GFR calc Af Amer 46 (*)    All other components within normal limits  CBC - Abnormal; Notable for the following:    Hemoglobin 11.3 (*)    HCT 34.3 (*)    RDW 15.8 (*)    All other components within normal limits  TROPONIN I - Abnormal; Notable for the following:    Troponin I 0.06 (*)    All other components within normal limits  BRAIN NATRIURETIC PEPTIDE - Abnormal; Notable for the following:    B Natriuretic Peptide 369.0 (*)    All other components within normal limits   ____________________________________________  EKG  ED ECG REPORT I, Arta Silence, the attending physician, personally viewed and interpreted this ECG.  Date: 11/07/2016 EKG Time: 1934 Rate: 98 Rhythm: normal sinus rhythm QRS Axis: normal Intervals: normal ST/T Wave abnormalities: nonspecific T wave abnormalities Narrative Interpretation: no evidence of acute ischemia; no significant change when compared to EKG of 03/23/2016 ____________________________________________  RADIOLOGY  Chest X ray with bilat vascular congestion, consistent with CHF   ____________________________________________   PROCEDURES  Procedure(s) performed:  No    Critical Care performed: Yes  CRITICAL CARE Performed by: Arta Silence   Total critical care time: 45 minutes  Critical care time was exclusive of separately billable procedures and treating other patients.  Critical care was necessary to treat or prevent imminent or life-threatening deterioration.  Critical care was time spent personally by me on the following activities: development of treatment plan with patient and/or surrogate as well as nursing, discussions with consultants, evaluation of patient's response to treatment, examination of patient, obtaining history from patient or surrogate, ordering and performing treatments and interventions, ordering and review of laboratory studies, ordering and review of radiographic studies, pulse oximetry and re-evaluation of patient's condition. ____________________________________________   INITIAL IMPRESSION / ASSESSMENT AND PLAN / ED COURSE  Pertinent labs & imaging results that were available during my care of the patient were reviewed by me and considered in my medical decision making (see chart for details).  he 28-year-old female with history of CHF and COPD presents with gradual worsening shortness of breath over the last 1-2 days, associated with chest pressure. On arrival, patient found to be hypoxic and in some respiratory distress, with hypertension and borderline temperature. After being placed on BiPAP patient now appears more comfortable. Presentation most consistent with acute CHF exacerbation given the hypertension, rales on exam, and peripheral edema. Plan: BiPAP, nitro drip, IV Lasix, cardiac labs, and reassess. Plan for admission.  ----------------------------------------- 11:14 PM on 11/07/2016 -----------------------------------------  Patient with significant improvement on BiPAP and nitro drip. Blood pressure improved; will plan to discontinue nitro drip after it is in the 160s to 170s. Workup consistent  with acute CHF. Will admit patient; signed out to hospitalist Dr. Ara Kussmaul.     ____________________________________________   FINAL CLINICAL IMPRESSION(S) / ED DIAGNOSES  Final diagnoses:  Acute congestive heart failure, unspecified heart failure type ([place])      NEW MEDICATIONS STARTED DURING THIS VISIT:  New Prescriptions   No medications on file     Note:  This document was prepared using Dragon voice recognition software and may include unintentional dictation errors.    Arta Silence, MD 11/07/16 2316

## 2016-11-07 NOTE — ED Triage Notes (Signed)
Patient with complaint of central chest pain that radiates to the left side of her chest and into her back times two days. Patient states that she has also been feeling short of breath.

## 2016-11-07 NOTE — H&P (Signed)
History and Physical   [facility] - [place] @ [facility] Admission History and Physical [employer], D.O.    Patient Name: [name] MR#: [id number] Date of Birth: [birth year] Date of Admission: 11/07/2016  Referring MD/NP/PA: Dr. Cherylann Banas Primary Care Physician: [name], NP  Chief Complaint:  Chief Complaint  Patient presents with  . Chest Pain  . Shortness of Breath    HPI: [name] is a 46 y.o. female with a known history of asthma, COPD,diastolic congestive heart failure, CKD3, diabetes, hypertension, iron deficiency anemia, morbid obesity presents to the emergency department for evaluation of shortness of breath.  Patient was in a usual state of health until 3-4 days when she describes progressively worsening shortness of breath, dyspnea on exertion associated with left side and lateral chest pain, pain with inspiration. Patient states that she has been out of most of her medications for 3-4 days as she has not been able to obtain them secondary to cost and lack of insurance coverage.   EMS/ED Course: Patient was placed on BiPAP, received Lasix, nitroglycerin drip for hypertensive urgency and congestive heart failure. Medical admission has been requested.  Review of Systems:  CONSTITUTIONAL: No fever/chills, fatigue, weakness, weight gain/loss, headache. EYES: No blurry or double vision. ENT: No tinnitus, postnasal drip, redness or soreness of the oropharynx. RESPIRATORY: positive shortness of breath.No cough,wheeze.  No hemoptysis.  CARDIOVASCULAR: Positive left lateral chest pain. No palpitations, syncope, orthopnea. No lower extremity edema.  GASTROINTESTINAL: No nausea, vomiting, abdominal pain, diarrhea, constipation.  No hematemesis, melena or hematochezia. GENITOURINARY: No dysuria, frequency, hematuria. ENDOCRINE: No polyuria or nocturia. No heat or cold intolerance. HEMATOLOGY: No anemia, bruising, bleeding. INTEGUMENTARY: No rashes, ulcers,  lesions. MUSCULOSKELETAL: No arthritis, gout, dyspnea. NEUROLOGIC: No numbness, tingling, ataxia, seizure-type activity, weakness. PSYCHIATRIC: No anxiety, depression, insomnia.   Past Medical History:  Diagnosis Date  . Asthma   . CHF (congestive heart failure) ([place])   . COPD (chronic obstructive pulmonary disease) ([place])   . Diabetes mellitus without complication ([place])   . Hypertension   . Iron deficiency anemia   . Pericardial effusion    a. 11/2014 CT Chest: Mod effusion.  [name] Poorly controlled diabetes mellitus ([place])    a. 11/2014 A1c 13.2.    Past Surgical History:  Procedure Laterality Date  . CESAREAN SECTION     times 3     reports that she has never smoked. She has never used smokeless tobacco. She reports that she drinks alcohol. She reports that she does not use drugs.  Allergies  Allergen Reactions  . Feraheme [Ferumoxytol] Itching  . Pollen Extract   . Sulfa Antibiotics Itching    Family History  Problem Relation Age of Onset  . Diabetes Mother   . Hypertension Mother   . Hypertension Father   . Diabetes Father   . Hypertension Unknown   . Diabetes Unknown   . Breast cancer Maternal Aunt 60  . Breast cancer Maternal Aunt 50    Prior to Admission medications   Medication Sig Start Date End Date Taking? Authorizing Provider  acetaminophen (TYLENOL) 500 MG tablet Take 1,000 mg by mouth every 6 (six) hours as needed for mild pain, fever or headache.    [provider]  albuterol (PROVENTIL HFA;VENTOLIN HFA) 108 (90 Base) MCG/ACT inhaler Inhale 2 puffs into the lungs every 6 (six) hours as needed for wheezing or shortness of breath. 09/23/16   [name] A, FNP  albuterol (PROVENTIL) (2.5 MG/3ML) 0.083% nebulizer solution  Take 2.5 mg by nebulization every 6 (six) hours as needed for wheezing or shortness of breath.    [provider]  amLODipine (NORVASC) 10 MG tablet Take 1 tablet (10 mg total) by mouth daily. 12/19/14   Dustin Flock,  MD  aspirin EC 81 MG tablet Take 81 mg by mouth daily.     [provider]  atorvastatin (LIPITOR) 20 MG tablet Take 1 tablet (20 mg total) by mouth daily at 6 PM. 06/29/16   Vaughan Basta, MD  budesonide (PULMICORT) 0.25 MG/2ML nebulizer solution Take 2 mLs (0.25 mg total) by nebulization 2 (two) times daily. 12/19/14   Dustin Flock, MD  citalopram (CELEXA) 40 MG tablet Take 40 mg by mouth daily.    [provider]  guaiFENesin-dextromethorphan (ROBITUSSIN DM) 100-10 MG/5ML syrup Take 5 mLs by mouth every 4 (four) hours as needed for cough. 08/13/15   Demetrios Loll, MD  hydrALAZINE (APRESOLINE) 100 MG tablet Take 1 tablet (100 mg total) by mouth every 8 (eight) hours. 12/19/14   Dustin Flock, MD  insulin aspart (NOVOLOG FLEXPEN) 100 UNIT/ML FlexPen Inject 0-12 Units into the skin 3 (three) times daily with meals as needed for high blood sugar. Pt uses as needed per sliding scale.    [provider]  Insulin Degludec (TRESIBA FLEXTOUCH) 100 UNIT/ML SOPN Inject 45 Units into the skin at bedtime. 08/13/15   Demetrios Loll, MD  metoprolol 75 MG TABS Take 75 mg by mouth 2 (two) times daily. 06/29/16   Vaughan Basta, MD  polyethylene glycol (MIRALAX / GLYCOLAX) packet Take 17 g by mouth daily as needed for mild constipation. 06/29/16   Vaughan Basta, MD  potassium chloride (K-DUR,KLOR-CON) 10 MEQ tablet Take 1 tablet (10 mEq total) by mouth 2 (two) times daily. Can take an additional tablet when takes the metolazone 04/12/16   [name] A, FNP  simvastatin (ZOCOR) 40 MG tablet Take 40 mg by mouth at bedtime.    [provider]  torsemide (DEMADEX) 20 MG tablet Take 2 tablets (40 mg total) by mouth daily. 09/23/16   Alisa Graff, FNP    Physical Exam: Vitals:   11/07/16 2355 11/08/16 0000 11/08/16 0020 11/08/16 0025  BP: (!) 178/101 (!) 184/107 (!) 166/111 (!) 171/107  Pulse: 87 85 85 82  Resp: (!) 26 (!) 30 (!) 29 (!) 27  Temp:       TempSrc:      SpO2: 97% 95% 96% 95%  Weight:      Height:        GENERAL: 46 y.o.-year-old morbidly obese female patient, sitting up in the bed in no acute distress.  BiPAP in place. Pleasant and cooperative.  HEENT: Head atraumatic, normocephalic. Pupils equal. Mucus membranes moist. NECK: Supple, full range of motion. No JVD, no bruit heard. No thyroid enlargement, no tenderness, no cervical lymphadenopathy. CHEST: Bibasilar rales.  No use of accessory muscles of respiration.  No reproducible chest wall tenderness.  CARDIOVASCULAR: S1, S2 normal. No murmurs, rubs, or gallops. Cap refill <2 seconds. Pulses intact distally.  ABDOMEN: Soft, nondistended, nontender. No rebound, guarding, rigidity. Normoactive bowel sounds present in all four quadrants.  EXTREMITIES: No pedal edema, cyanosis, or clubbing. No calf tenderness or Homan's sign.  NEUROLOGIC: The patient is alert and oriented x 3. Cranial nerves II through XII are grossly intact with no focal sensorimotor deficit. PSYCHIATRIC:  Normal affect, mood, thought content. SKIN: Warm, dry, and intact without obvious rash, lesion, or ulcer.    Labs  on Admission:  CBC:  Recent Labs Lab 11/07/16 1949  WBC 5.7  HGB 11.3*  HCT 34.3*  MCV 86.7  PLT 122   Basic Metabolic Panel:  Recent Labs Lab 11/07/16 1949  NA 140  K 3.9  CL 95*  CO2 37*  GLUCOSE 195*  BUN 20  CREATININE 1.53*  CALCIUM 8.6*   GFR: Estimated Creatinine Clearance: 94.6 mL/min (A) (by C-G formula based on SCr of 1.53 mg/dL (H)). Liver Function Tests: No results for input(s): AST, ALT, ALKPHOS, BILITOT, PROT, ALBUMIN in the last 168 hours. No results for input(s): LIPASE, AMYLASE in the last 168 hours. No results for input(s): AMMONIA in the last 168 hours. Coagulation Profile: No results for input(s): INR, PROTIME in the last 168 hours. Cardiac Enzymes:  Recent Labs Lab 11/07/16 1949  TROPONINI 0.06*   BNP (last 3 results) No results for  input(s): PROBNP in the last 8760 hours. HbA1C: No results for input(s): HGBA1C in the last 72 hours. CBG:  Recent Labs Lab 11/07/16 2334  GLUCAP 162*   Lipid Profile: No results for input(s): CHOL, HDL, LDLCALC, TRIG, CHOLHDL, LDLDIRECT in the last 72 hours. Thyroid Function Tests: No results for input(s): TSH, T4TOTAL, FREET4, T3FREE, THYROIDAB in the last 72 hours. Anemia Panel: No results for input(s): VITAMINB12, FOLATE, FERRITIN, TIBC, IRON, RETICCTPCT in the last 72 hours. Urine analysis:    Component Value Date/Time   COLORURINE YELLOW (A) 06/26/2016 0800   APPEARANCEUR CLEAR (A) 06/26/2016 0800   LABSPEC 1.010 06/26/2016 0800   PHURINE 6.0 06/26/2016 0800   GLUCOSEU >=500 (A) 06/26/2016 0800   HGBUR NEGATIVE 06/26/2016 0800   BILIRUBINUR NEGATIVE 06/26/2016 0800   KETONESUR NEGATIVE 06/26/2016 0800   PROTEINUR 100 (A) 06/26/2016 0800   NITRITE NEGATIVE 06/26/2016 0800   LEUKOCYTESUR NEGATIVE 06/26/2016 0800   Sepsis Labs: @LABRCNTIP (procalcitonin:4,lacticidven:4) )No results found for this or any previous visit (from the past 240 hour(s)).   Radiological Exams on Admission: Dg Chest Port 1 View  Result Date: 11/07/2016 CLINICAL DATA:  Complaint of central chest pain radiating to the left side of the chest and back x2 days. EXAM: PORTABLE CHEST 1 VIEW COMPARISON:  06/25/2016 FINDINGS: Low lung volumes with cardiomegaly, pulmonary vascular congestion and right effusion. No acute osseous abnormality. IMPRESSION: Low lung volumes are again noted with stable cardiomegaly and mild vascular congestion consistent CHF. Probable small right effusion. Electronically Signed   By: Ashley Royalty M.D.   On: 11/07/2016 20:35    EKG: Normal sinus rhythm at 98 bpm with normal axis and nonspecific ST-T wave changes.   Assessment/Plan  This is a 46 y.o. female with a history of asthma, COPD,diastolic congestive heart failure, CKD3, diabetes, hypertension, iron deficiency anemia,  morbid obesity now being admitted with:  #. Acute exacerbation of congestive heart failure - admit stepdown, telemetry - Continue BiPAP. - Telemetry monitoring. - continue IV Lasix - Continue metoprolol, atorvastatin,Demadex - Intake/output, daily weight. - Trend troponins, check lipids and TSH. - CCM andCardiology consultation requested.  #. Hypertensive emergency Continue nitroglycerin drip Continue Norvasc, metoprolol, hydralazine  #. History of hyperlipidemia Continue Zocor  #. History of diabetes - Continue bedtime insulin - Accu-Cheks before meals and at bedtime with regular insulin sliding scale coverage  #. History of COPD/asthma - Continue O2, nebs as needed - Continue Pulmicort  #. History of depression - Continue Celexa  Admission status: inpatient, stepdown IV Fluids: Hep-Lock Diet/Nutrition: heart healthy, carb controlled Consults called: CCM, cardiology  DVT Px: Lovenox, SCDs and  early ambulation. Code Status: Full Code  Disposition Plan: To home in 1-2 days  All the records are reviewed and case discussed with ED provider. Management plans discussed with the patient and/or family who express understanding and agree with plan of care.  Christipher Rieger D.O. on 11/08/2016 at 12:52 AM Between 7am to 6pm - Pager - [phone] After 6pm go to [email or web address] - [employer] [facility] Office [phone] CC: Primary care physician; [name], NP   11/08/2016, 12:52 AM

## 2016-11-08 ENCOUNTER — Inpatient Hospital Stay: Admit: 2016-11-08 | Payer: Medicaid Other

## 2016-11-08 DIAGNOSIS — I5033 Acute on chronic diastolic (congestive) heart failure: Secondary | ICD-10-CM

## 2016-11-08 DIAGNOSIS — R0600 Dyspnea, unspecified: Secondary | ICD-10-CM

## 2016-11-08 LAB — CBC
HEMATOCRIT: 30.1 % — AB (ref 35.0–47.0)
Hemoglobin: 9.9 g/dL — ABNORMAL LOW (ref 12.0–16.0)
MCH: 28.8 pg (ref 26.0–34.0)
MCHC: 32.9 g/dL (ref 32.0–36.0)
MCV: 87.4 fL (ref 80.0–100.0)
Platelets: 286 10*3/uL (ref 150–440)
RBC: 3.44 MIL/uL — ABNORMAL LOW (ref 3.80–5.20)
RDW: 15.2 % — AB (ref 11.5–14.5)
WBC: 5.7 10*3/uL (ref 3.6–11.0)

## 2016-11-08 LAB — BASIC METABOLIC PANEL
Anion gap: 9 (ref 5–15)
BUN: 20 mg/dL (ref 6–20)
CHLORIDE: 97 mmol/L — AB (ref 101–111)
CO2: 36 mmol/L — AB (ref 22–32)
Calcium: 8.3 mg/dL — ABNORMAL LOW (ref 8.9–10.3)
Creatinine, Ser: 1.66 mg/dL — ABNORMAL HIGH (ref 0.44–1.00)
GFR calc Af Amer: 42 mL/min — ABNORMAL LOW (ref >60)
GFR calc non Af Amer: 36 mL/min — ABNORMAL LOW (ref >60)
GLUCOSE: 128 mg/dL — AB (ref 65–99)
POTASSIUM: 3.4 mmol/L — AB (ref 3.5–5.1)
Sodium: 142 mmol/L (ref 135–145)

## 2016-11-08 LAB — GLUCOSE, CAPILLARY
GLUCOSE-CAPILLARY: 98 mg/dL (ref 65–99)
GLUCOSE-CAPILLARY: 181 mg/dL — AB (ref 65–99)
Glucose-Capillary: 159 mg/dL — ABNORMAL HIGH (ref 65–99)
Glucose-Capillary: 202 mg/dL — ABNORMAL HIGH (ref 65–99)

## 2016-11-08 LAB — TROPONIN I
TROPONIN I: 0.06 ng/mL — AB (ref <0.03)
Troponin I: 0.06 ng/mL (ref <0.03)
Troponin I: 0.05 ng/mL (ref <0.03)

## 2016-11-08 LAB — TSH: TSH: 2.831 u[IU]/mL (ref 0.350–4.500)

## 2016-11-08 LAB — MRSA PCR SCREENING: MRSA by PCR: POSITIVE — AB

## 2016-11-08 MED ORDER — LABETALOL HCL 5 MG/ML IV SOLN: 5.0000 mg | Freq: Four times a day (QID) | INTRAVENOUS | Status: DC | PRN

## 2016-11-08 MED ORDER — POTASSIUM CHLORIDE CRYS ER 20 MEQ PO TBCR
20.0000 meq | EXTENDED_RELEASE_TABLET | Freq: Two times a day (BID) | ORAL | Status: DC
  Administered 2016-11-08 – 2016-11-16 (×16): 20 meq via ORAL
  Filled 2016-11-08 (×16): qty 1

## 2016-11-08 MED ORDER — BUDESONIDE 0.25 MG/2ML IN SUSP
0.2500 mg | Freq: Two times a day (BID) | RESPIRATORY_TRACT | Status: DC
  Administered 2016-11-08 – 2016-11-16 (×17): 0.25 mg via RESPIRATORY_TRACT
  Filled 2016-11-08 (×17): qty 2

## 2016-11-08 MED ORDER — FUROSEMIDE 10 MG/ML IJ SOLN
80.0000 mg | Freq: Two times a day (BID) | INTRAMUSCULAR | Status: DC
  Administered 2016-11-08 – 2016-11-11 (×6): 80 mg via INTRAVENOUS
  Filled 2016-11-08 (×6): qty 8

## 2016-11-08 MED ORDER — SODIUM CHLORIDE 0.9 % IV SOLN: 250.0000 mL | INTRAVENOUS | Status: DC | PRN

## 2016-11-08 MED ORDER — SODIUM CHLORIDE 0.9% FLUSH: 3.0000 mL | INTRAVENOUS | Status: DC | PRN

## 2016-11-08 MED ORDER — SIMVASTATIN 20 MG PO TABS
40.0000 mg | ORAL_TABLET | Freq: Every day | ORAL | Status: DC
  Administered 2016-11-08 – 2016-11-10 (×3): 40 mg via ORAL
  Filled 2016-11-08 (×3): qty 2

## 2016-11-08 MED ORDER — INSULIN ASPART 100 UNIT/ML ~~LOC~~ SOLN
0.0000 [IU] | Freq: Every day | SUBCUTANEOUS | Status: DC
  Administered 2016-11-10 – 2016-11-13 (×4): 2 [IU] via SUBCUTANEOUS
  Filled 2016-11-08 (×4): qty 1

## 2016-11-08 MED ORDER — ALBUTEROL SULFATE (2.5 MG/3ML) 0.083% IN NEBU
2.5000 mg | INHALATION_SOLUTION | Freq: Two times a day (BID) | RESPIRATORY_TRACT | Status: DC
  Administered 2016-11-08 – 2016-11-16 (×17): 2.5 mg via RESPIRATORY_TRACT
  Filled 2016-11-08 (×16): qty 3

## 2016-11-08 MED ORDER — ENOXAPARIN SODIUM 40 MG/0.4ML ~~LOC~~ SOLN
40.0000 mg | Freq: Two times a day (BID) | SUBCUTANEOUS | Status: DC
  Administered 2016-11-08 – 2016-11-15 (×15): 40 mg via SUBCUTANEOUS
  Filled 2016-11-08 (×16): qty 0.4

## 2016-11-08 MED ORDER — ATORVASTATIN CALCIUM 20 MG PO TABS
20.0000 mg | ORAL_TABLET | Freq: Every day | ORAL | Status: DC
  Administered 2016-11-08: 20 mg via ORAL
  Filled 2016-11-08: qty 1

## 2016-11-08 MED ORDER — HYDROCODONE-ACETAMINOPHEN 5-325 MG PO TABS
1.0000 | ORAL_TABLET | ORAL | Status: DC | PRN
  Administered 2016-11-08 – 2016-11-14 (×15): 1 via ORAL
  Filled 2016-11-08 (×15): qty 1

## 2016-11-08 MED ORDER — FUROSEMIDE 10 MG/ML IJ SOLN
20.0000 mg | Freq: Every day | INTRAMUSCULAR | Status: DC
  Administered 2016-11-08: 20 mg via INTRAVENOUS
  Filled 2016-11-08: qty 2

## 2016-11-08 MED ORDER — AMLODIPINE BESYLATE 10 MG PO TABS
10.0000 mg | ORAL_TABLET | Freq: Every day | ORAL | Status: DC
  Administered 2016-11-08 – 2016-11-12 (×5): 10 mg via ORAL
  Filled 2016-11-08 (×5): qty 1

## 2016-11-08 MED ORDER — ACETAMINOPHEN 500 MG PO TABS
1000.0000 mg | ORAL_TABLET | Freq: Four times a day (QID) | ORAL | Status: DC | PRN
  Administered 2016-11-08 – 2016-11-12 (×2): 1000 mg via ORAL
  Filled 2016-11-08 (×2): qty 2

## 2016-11-08 MED ORDER — HYDRALAZINE HCL 20 MG/ML IJ SOLN: 10.0000 mg | Freq: Four times a day (QID) | INTRAMUSCULAR | Status: DC | PRN

## 2016-11-08 MED ORDER — NITROGLYCERIN IN D5W 200-5 MCG/ML-% IV SOLN
100.0000 ug/min | INTRAVENOUS | Status: DC
  Administered 2016-11-08: 5 ug/min via INTRAVENOUS

## 2016-11-08 MED ORDER — CITALOPRAM HYDROBROMIDE 20 MG PO TABS
40.0000 mg | ORAL_TABLET | Freq: Every day | ORAL | Status: DC
  Administered 2016-11-08 – 2016-11-16 (×9): 40 mg via ORAL
  Filled 2016-11-08 (×9): qty 2

## 2016-11-08 MED ORDER — TORSEMIDE 20 MG PO TABS
40.0000 mg | ORAL_TABLET | Freq: Every day | ORAL | Status: DC
  Administered 2016-11-08: 40 mg via ORAL
  Filled 2016-11-08: qty 2

## 2016-11-08 MED ORDER — ONDANSETRON HCL 4 MG/2ML IJ SOLN: 4.0000 mg | Freq: Four times a day (QID) | INTRAMUSCULAR | Status: DC | PRN

## 2016-11-08 MED ORDER — ASPIRIN EC 81 MG PO TBEC
81.0000 mg | DELAYED_RELEASE_TABLET | Freq: Every day | ORAL | Status: DC
  Administered 2016-11-08 – 2016-11-16 (×9): 81 mg via ORAL
  Filled 2016-11-08 (×9): qty 1

## 2016-11-08 MED ORDER — POTASSIUM CHLORIDE CRYS ER 10 MEQ PO TBCR
10.0000 meq | EXTENDED_RELEASE_TABLET | Freq: Two times a day (BID) | ORAL | Status: DC
  Administered 2016-11-08: 10 meq via ORAL
  Filled 2016-11-08 (×3): qty 1

## 2016-11-08 MED ORDER — POLYETHYLENE GLYCOL 3350 17 G PO PACK
17.0000 g | PACK | Freq: Every day | ORAL | Status: DC | PRN
  Administered 2016-11-12 – 2016-11-13 (×2): 17 g via ORAL
  Filled 2016-11-08 (×2): qty 1

## 2016-11-08 MED ORDER — INSULIN ASPART 100 UNIT/ML ~~LOC~~ SOLN
0.0000 [IU] | Freq: Three times a day (TID) | SUBCUTANEOUS | Status: DC
  Administered 2016-11-08: 3 [IU] via SUBCUTANEOUS
  Administered 2016-11-09: 2 [IU] via SUBCUTANEOUS
  Administered 2016-11-09: 1 [IU] via SUBCUTANEOUS
  Administered 2016-11-10: 2 [IU] via SUBCUTANEOUS
  Administered 2016-11-11: 1 [IU] via SUBCUTANEOUS
  Administered 2016-11-11: 2 [IU] via SUBCUTANEOUS
  Administered 2016-11-11: 5 [IU] via SUBCUTANEOUS
  Administered 2016-11-12 (×2): 2 [IU] via SUBCUTANEOUS
  Administered 2016-11-12: 3 [IU] via SUBCUTANEOUS
  Administered 2016-11-13: 2 [IU] via SUBCUTANEOUS
  Administered 2016-11-14: 1 [IU] via SUBCUTANEOUS
  Administered 2016-11-15 – 2016-11-16 (×3): 2 [IU] via SUBCUTANEOUS
  Filled 2016-11-08 (×16): qty 1

## 2016-11-08 MED ORDER — HYDRALAZINE HCL 50 MG PO TABS
100.0000 mg | ORAL_TABLET | Freq: Three times a day (TID) | ORAL | Status: DC
  Administered 2016-11-08 – 2016-11-16 (×26): 100 mg via ORAL
  Filled 2016-11-08 (×26): qty 2

## 2016-11-08 MED ORDER — ALBUTEROL SULFATE (2.5 MG/3ML) 0.083% IN NEBU
INHALATION_SOLUTION | RESPIRATORY_TRACT | Status: AC
  Filled 2016-11-08: qty 3

## 2016-11-08 MED ORDER — INSULIN GLARGINE 100 UNIT/ML ~~LOC~~ SOLN
20.0000 [IU] | Freq: Every day | SUBCUTANEOUS | Status: DC
  Administered 2016-11-08 – 2016-11-11 (×4): 20 [IU] via SUBCUTANEOUS
  Filled 2016-11-08 (×5): qty 0.2

## 2016-11-08 MED ORDER — SODIUM CHLORIDE 0.9% FLUSH
3.0000 mL | Freq: Two times a day (BID) | INTRAVENOUS | Status: DC
  Administered 2016-11-08 – 2016-11-15 (×15): 3 mL via INTRAVENOUS

## 2016-11-08 MED ORDER — INSULIN DEGLUDEC 100 UNIT/ML ~~LOC~~ SOPN: 45.0000 [IU] | PEN_INJECTOR | Freq: Every day | SUBCUTANEOUS | Status: DC

## 2016-11-08 MED ORDER — METOPROLOL TARTRATE 25 MG PO TABS
75.0000 mg | ORAL_TABLET | Freq: Two times a day (BID) | ORAL | Status: DC
  Administered 2016-11-08 – 2016-11-12 (×9): 75 mg via ORAL
  Filled 2016-11-08 (×9): qty 3

## 2016-11-08 NOTE — Progress Notes (Signed)
[place] at [facility]                                                                                                                                                                                  Patient Demographics   [name], is a 46 y.o. female, DOB - 02-23-1971, RWE:[id number]  Admit date - 11/07/2016   Admitting Physician [name]  Outpatient Primary MD for the patient is [name]   LOS - 1  Subjective: Pt breathing improved, she still short of breath denying any chest pain Complains of nasal congestion   Review of Systems:   CONSTITUTIONAL: No documented fever. No fatigue, weakness. No weight gain, no weight loss.  EYES: No blurry or double vision.  ENT: No tinnitus. No postnasal drip. No redness of the oropharynx.  RESPIRATORY: positive cough, no wheeze, no hemoptysis. positive dyspnea.  CARDIOVASCULAR: No chest pain. No orthopnea. No palpitations. No syncope.  GASTROINTESTINAL: No nausea, no vomiting or diarrhea. No abdominal pain. No melena or hematochezia.  GENITOURINARY: No dysuria or hematuria.  ENDOCRINE: No polyuria or nocturia. No heat or cold intolerance.  HEMATOLOGY: No anemia. No bruising. No bleeding.  INTEGUMENTARY: No rashes. No lesions.  MUSCULOSKELETAL: No arthritis. No swelling. No gout.  NEUROLOGIC: No numbness, tingling, or ataxia. No seizure-type activity.  PSYCHIATRIC: No anxiety. No insomnia. No ADD.    Vitals:   Vitals:   11/08/16 1000 11/08/16 1100 11/08/16 1200 11/08/16 1300  BP: (!) 158/89 140/89 (!) 161/98 (!) 161/93  Pulse: 94 89 98 95  Resp: (!) 27 (!) 30 13 (!) 27  Temp:      TempSrc:      SpO2: 90% 91% 96% (!) 89%  Weight:      Height:        Wt Readings from Last 3 Encounters:  11/08/16 (!) 329 lb 2.4 oz (149.3 kg)  09/23/16 (!) 318 lb 8 oz (144.5 kg)  08/22/16 (!) 331 lb (150.1 kg)     Intake/Output Summary (Last 24 hours) at 11/08/16 1354 Last data filed at  11/08/16 1100  Gross per 24 hour  Intake           222.15 ml  Output              400 ml  Net          -177.85 ml    Physical Exam:   GENERAL: Pleasant-appearing in no apparent distress.  HEAD, EYES, EARS, NOSE AND THROAT: Atraumatic, normocephalic. Extraocular muscles are intact. Pupils equal and reactive to light. Sclerae anicteric. No conjunctival injection. No oro-pharyngeal erythema.  NECK: Supple. There is no jugular  venous distention. No bruits, no lymphadenopathy, no thyromegaly.  HEART: Regular rate and rhythm,. No murmurs, no rubs, no clicks.  LUNGS: occasional wheezing without any acessary muscle usage ABDOMEN: Soft, flat, nontender, nondistended. Has good bowel sounds. No hepatosplenomegaly appreciated.  EXTREMITIES: No evidence of any cyanosis, clubbing, or peripheral edema.  +2 pedal and radial pulses bilaterally.  NEUROLOGIC: The patient is alert, awake, and oriented x3 with no focal motor or sensory deficits appreciated bilaterally.  SKIN: Moist and warm with no rashes appreciated.  Psych: Not anxious, depressed LN: No inguinal LN enlargement    Antibiotics   Anti-infectives    None      Medications   Scheduled Meds: . albuterol  2.5 mg Nebulization BID  . albuterol      . amLODipine  10 mg Oral Daily  . aspirin EC  81 mg Oral Daily  . atorvastatin  20 mg Oral q1800  . budesonide  0.25 mg Nebulization BID  . citalopram  40 mg Oral Daily  . enoxaparin (LOVENOX) injection  40 mg Subcutaneous BID  . furosemide  20 mg Intravenous Daily  . hydrALAZINE  100 mg Oral Q8H  . insulin aspart  0-5 Units Subcutaneous QHS  . insulin aspart  0-9 Units Subcutaneous TID WC  . insulin degludec  45 Units Subcutaneous QHS  . metoprolol tartrate  75 mg Oral BID  . potassium chloride  10 mEq Oral BID  . simvastatin  40 mg Oral QHS  . sodium chloride flush  3 mL Intravenous Q12H  . torsemide  40 mg Oral Daily   Continuous Infusions: . sodium chloride    . nitroGLYCERIN  Stopped (11/08/16 1030)   PRN Meds:.sodium chloride, acetaminophen, hydrALAZINE, labetalol, ondansetron (ZOFRAN) IV, polyethylene glycol, sodium chloride flush   Data Review:   Micro Results Recent Results (from the past 240 hour(s))  MRSA PCR Screening     Status: Abnormal   Collection Time: 11/08/16  4:57 AM  Result Value Ref Range Status   MRSA by PCR POSITIVE (A) NEGATIVE Final    Comment:        The GeneXpert MRSA Assay (FDA approved for NASAL specimens only), is one component of a comprehensive MRSA colonization surveillance program. It is not intended to diagnose MRSA infection nor to guide or monitor treatment for MRSA infections. RESULT CALLED TO, READ BACK BY AND VERIFIED WITH:  RENEE BABB AT 7672 11/08/16 SDR     Radiology Reports Dg Chest Port 1 View  Result Date: 11/07/2016 CLINICAL DATA:  Complaint of central chest pain radiating to the left side of the chest and back x2 days. EXAM: PORTABLE CHEST 1 VIEW COMPARISON:  06/25/2016 FINDINGS: Low lung volumes with cardiomegaly, pulmonary vascular congestion and right effusion. No acute osseous abnormality. IMPRESSION: Low lung volumes are again noted with stable cardiomegaly and mild vascular congestion consistent CHF. Probable small right effusion. Electronically Signed   By: Ashley Royalty M.D.   On: 11/07/2016 20:35     CBC  Recent Labs Lab 11/07/16 1949 11/08/16 0624  WBC 5.7 5.7  HGB 11.3* 9.9*  HCT 34.3* 30.1*  PLT 291 286  MCV 86.7 87.4  MCH 28.5 28.8  MCHC 32.9 32.9  RDW 15.8* 15.2*    Chemistries   Recent Labs Lab 11/07/16 1949 11/08/16 0624  NA 140 142  K 3.9 3.4*  CL 95* 97*  CO2 37* 36*  GLUCOSE 195* 128*  BUN 20 20  CREATININE 1.53* 1.66*  CALCIUM 8.6* 8.3*   ------------------------------------------------------------------------------------------------------------------  estimated creatinine clearance is 62.5 mL/min (A) (by C-G formula based on SCr of 1.66 mg/dL  (H)). ------------------------------------------------------------------------------------------------------------------ No results for input(s): HGBA1C in the last 72 hours. ------------------------------------------------------------------------------------------------------------------ No results for input(s): CHOL, HDL, LDLCALC, TRIG, CHOLHDL, LDLDIRECT in the last 72 hours. ------------------------------------------------------------------------------------------------------------------  Recent Labs  11/08/16 0624  TSH 2.831   ------------------------------------------------------------------------------------------------------------------ No results for input(s): VITAMINB12, FOLATE, FERRITIN, TIBC, IRON, RETICCTPCT in the last 72 hours.  Coagulation profile No results for input(s): INR, PROTIME in the last 168 hours.  No results for input(s): DDIMER in the last 72 hours.  Cardiac Enzymes  Recent Labs Lab 11/07/16 1949 11/08/16 0624 11/08/16 1043  TROPONINI 0.06* 0.06* 0.06*   ------------------------------------------------------------------------------------------------------------------ Invalid input(s): POCBNP    Assessment & Plan   This is a 46 y.o. female with a history of asthma, COPD,diastolic congestive heart failure, CKD3, diabetes, hypertension, iron deficiency anemia, morbid obesity now being admitted with:  #. Acute exacerbation of congestive heart failure -  continue IV Lasix - Continue metoprolol, atorvastatin,Demadex - Intake/output, daily weight. -- CCM andCardiology consultation requested.  #. Hypertensive emergency Continue Norvasc, metoprolol, hydralazine  #. History of hyperlipidemia Continue Zocor  #. History of diabetes - Continue bedtime insulin - Accu-Cheks before meals and at bedtime with regular insulin sliding scale coverage  #. History of COPD/asthma - Continue O2, nebs as needed - Continue Pulmicort -  Consider adding  Solu-Medrol  #. History of depression - Continue Celexa     Code Status Orders        Start     Ordered   11/08/16 0451  Full code  Continuous     11/08/16 0450    Code Status History    Date Active Date Inactive Code Status Order ID Comments User Context   06/25/2016  2:47 PM 06/29/2016  6:15 PM Full Code [id number]  Hillary Bow, MD ED   08/09/2015  5:57 PM 08/13/2015  5:21 PM Full Code [id number]  Bettey Costa, MD Inpatient   12/31/2014  8:25 PM 01/03/2015  5:49 PM Full Code [id number]  Hower, Aaron Mose, MD ED   12/15/2014 11:54 AM 12/19/2014  4:57 PM Full Code [id number]  Hillary Bow, MD ED           Consults  pulmonry  DVT Prophylaxis  Lovenox    Lab Results  Component Value Date   PLT 286 11/08/2016     Time Spent in minutes   53min Greater than 50% of time spent in care coordination and counseling patient regarding the condition and plan of care.   Dustin Flock M.D on 11/08/2016 at 1:54 PM  Between 7am to 6pm - Pager - [phone]  After 6pm go to [email or web address] - password [email or web address] [facility]   Office  [phone]

## 2016-11-08 NOTE — ED Notes (Signed)
CCU RN Aleah called for report.

## 2016-11-08 NOTE — ED Notes (Addendum)
MD Owens Shark attempt trial off bipap d/t pt request and decreased work of breathing for extended time, pt unable to tolerate nasal cannula 3L. Will transport to CCU when RT available on bipap.   Admitting MD verbal order to maintain nitro drip at 138mcg.

## 2016-11-08 NOTE — Consult Note (Signed)
Name: [name] MRN: [id number] DOB: Jun 25, 1970    ADMISSION DATE:  11/07/2016  CONSULTATION DATE:  11/07/16  REFERRING MD :  Dr. Ara Kussmaul  CHIEF COMPLAINT:  Chest pain and shortness of breath  BRIEF PATIENT DESCRIPTION: 46 year old female with Acute On Chronic respiratory failure secondary to CHF exacerbation  SIGNIFICANT EVENTS  9/14 Patient admitted to the SDU with Acute on Chronic respiratory failure secondary to CHF exacerbation  STUDIES:  06/27/16 ECHO>>Transthoracic echocardiography. Image  quality was suboptimal. The study was technically difficult.- Left ventricle: The cavity size was normal. There was moderate   concentric hypertrophy. Systolic function was normal. The  estimated ejection fraction was in the range of 60% to 65%.   HISTORY OF PRESENT ILLNESS:  [name] is a 46 year old female with known history of Asthma,CHF,COPD,DM,CKD,Morbid obesity and Hypertension.  Patient presented to ED on 9/13 with shortness of breath and chest tightness.  Patient states that she has been out of most of her medications for 3-4 days and not able to get them as she does not have insurance coverage and she cannot afford them.  PAST MEDICAL HISTORY :   has a past medical history of Asthma; CHF (congestive heart failure) ([place]); COPD (chronic obstructive pulmonary disease) ([place]); Diabetes mellitus without complication ([place]); Hypertension; Iron deficiency anemia; Pericardial effusion; and Poorly controlled diabetes mellitus ([place]).  has a past surgical history that includes Cesarean section. Prior to Admission medications   Medication Sig Start Date End Date Taking? Authorizing Provider  acetaminophen (TYLENOL) 500 MG tablet Take 1,000 mg by mouth every 6 (six) hours as needed for mild pain, fever or headache.    [provider]  albuterol (PROVENTIL HFA;VENTOLIN HFA) 108 (90 Base) MCG/ACT inhaler Inhale 2 puffs into the lungs every 6 (six) hours as needed for wheezing or  shortness of breath. 09/23/16   Alisa Graff, FNP  albuterol (PROVENTIL) (2.5 MG/3ML) 0.083% nebulizer solution Take 2.5 mg by nebulization every 6 (six) hours as needed for wheezing or shortness of breath.    [provider]  amLODipine (NORVASC) 10 MG tablet Take 1 tablet (10 mg total) by mouth daily. 12/19/14   Dustin Flock, MD  aspirin EC 81 MG tablet Take 81 mg by mouth daily.     [provider]  atorvastatin (LIPITOR) 20 MG tablet Take 1 tablet (20 mg total) by mouth daily at 6 PM. 06/29/16   Vaughan Basta, MD  budesonide (PULMICORT) 0.25 MG/2ML nebulizer solution Take 2 mLs (0.25 mg total) by nebulization 2 (two) times daily. 12/19/14   Dustin Flock, MD  citalopram (CELEXA) 40 MG tablet Take 40 mg by mouth daily.    [provider]  guaiFENesin-dextromethorphan (ROBITUSSIN DM) 100-10 MG/5ML syrup Take 5 mLs by mouth every 4 (four) hours as needed for cough. 08/13/15   Demetrios Loll, MD  hydrALAZINE (APRESOLINE) 100 MG tablet Take 1 tablet (100 mg total) by mouth every 8 (eight) hours. 12/19/14   Dustin Flock, MD  insulin aspart (NOVOLOG FLEXPEN) 100 UNIT/ML FlexPen Inject 0-12 Units into the skin 3 (three) times daily with meals as needed for high blood sugar. Pt uses as needed per sliding scale.    [provider]  Insulin Degludec (TRESIBA FLEXTOUCH) 100 UNIT/ML SOPN Inject 45 Units into the skin at bedtime. 08/13/15   Demetrios Loll, MD  metoprolol 75 MG TABS Take 75 mg by mouth 2 (two) times daily. 06/29/16   Vaughan Basta, MD  polyethylene glycol [facility] /  GLYCOLAX) packet Take 17 g by mouth daily as needed for mild constipation. 06/29/16   Vaughan Basta, MD  potassium chloride (K-DUR,KLOR-CON) 10 MEQ tablet Take 1 tablet (10 mEq total) by mouth 2 (two) times daily. Can take an additional tablet when takes the metolazone 04/12/16   [name] A, FNP  simvastatin (ZOCOR) 40 MG tablet Take 40 mg by mouth at bedtime.    [provider]  torsemide (DEMADEX) 20 MG tablet Take 2 tablets (40 mg total) by mouth daily. 09/23/16   Alisa Graff, FNP   Allergies  Allergen Reactions  . Feraheme [Ferumoxytol] Itching  . Pollen Extract   . Sulfa Antibiotics Itching    FAMILY HISTORY:  family history includes Breast cancer (age of onset: 70) in her maternal aunt and maternal aunt; Diabetes in her father, mother, and unknown relative; Hypertension in her father, mother, and unknown relative. SOCIAL HISTORY:  reports that she has never smoked. She has never used smokeless tobacco. She reports that she drinks alcohol. She reports that she does not use drugs.  REVIEW OF SYSTEMS:   Constitutional: Negative for fever, chills, weight loss, malaise/fatigue and diaphoresis.  HENT: Negative for hearing loss, ear pain, nosebleeds, congestion, sore throat, neck pain, tinnitus and ear discharge.   Eyes: Negative for blurred vision, double vision, photophobia, pain, discharge and redness.  Respiratory: Negative for cough, hemoptysis, sputum production, shortness of breath, wheezing and stridor.   Cardiovascular: Negative for chest pain, palpitations, orthopnea, claudication, leg swelling and PND.  Gastrointestinal: Negative for heartburn, nausea, vomiting, abdominal pain, diarrhea, constipation, blood in stool and melena.  Genitourinary: Negative for dysuria, urgency, frequency, hematuria and flank pain.  Musculoskeletal: Negative for myalgias, back pain, joint pain and falls.  Skin: Negative for itching and rash.  Neurological: Negative for dizziness, tingling, tremors, sensory change, speech change, focal weakness, seizures, loss of consciousness, weakness and headaches.  Endo/Heme/Allergies: Negative for environmental allergies and polydipsia. Does not bruise/bleed easily.  SUBJECTIVE: Patient states that "she is feeling fine and is not short of breath on Bipap"  VITAL SIGNS: Temp:  [99.2 F (37.3 C)] 99.2 F (37.3 C)  (09/13 1934) Pulse Rate:  [82-111] 82 (09/14 0025) Resp:  [18-35] 27 (09/14 0025) BP: (166-227)/(99-134) 171/107 (09/14 0025) SpO2:  [85 %-100 %] 95 % (09/14 0025) Weight:  [240.4 kg (530 lb)] 240.4 kg (530 lb) (09/13 1930)  PHYSICAL EXAMINATION: General:  Morbidly obese African American female, on Bipap, in no acute distress Neuro:  Awake,Alert and oriented HEENT:  AT,[place], PERRLA, No jvd Cardiovascular:  S1s2,regular,no m/r/g Lungs:  Diminished air entry, no wheezes,crackles,rhonchi noted Abdomen: obese,round,active bowel sounds Musculoskeletal:  No edema,cyanosis noted Skin: Warm,dry and intact   Recent Labs Lab 11/07/16 1949  NA 140  K 3.9  CL 95*  CO2 37*  BUN 20  CREATININE 1.53*  GLUCOSE 195*    Recent Labs Lab 11/07/16 1949  HGB 11.3*  HCT 34.3*  WBC 5.7  PLT 291   Dg Chest Port 1 View  Result Date: 11/07/2016 CLINICAL DATA:  Complaint of central chest pain radiating to the left side of the chest and back x2 days. EXAM: PORTABLE CHEST 1 VIEW COMPARISON:  06/25/2016 FINDINGS: Low lung volumes with cardiomegaly, pulmonary vascular congestion and right effusion. No acute osseous abnormality. IMPRESSION: Low lung volumes are again noted with stable cardiomegaly and mild vascular congestion consistent CHF. Probable small right effusion. Electronically Signed   By: Ashley Royalty M.D.   On: 11/07/2016 20:35  ASSESSMENT / PLAN:  Acute on Chronic respiratory failure secondary to CHF exacerbation Obesity Hypoventilation Syndrome Elevated troponin secondary to demand ischemia Hx of COPD/Asthma Hypertensive emergency Hx of CKD Hx of DM Hx of Depression   Plan Continue Bipap, wean as tolerated Nitro gtt to keep SBP170mg /dl Continue Lasix Continue Aspirin Continue Amlodipine/Hydralazine/Metoprolol F/u ECHO Trend troponin Continue Simvastatin Continue Celexa Cardiology consulted Consulted Case management Blood Glucose checks with SSI coverage Trend  troponin [place] TSH  Shyleigh Daughtry,AG-ACNP Pulmonary and [place]   11/08/2016, 12:54 AM

## 2016-11-08 NOTE — ED Notes (Signed)
CCU RN taking pt unable to take report at this time. Secretary reported RN will return call at first available to take report.

## 2016-11-08 NOTE — Care Management Note (Signed)
Case Management Note  Patient Details  Name: [name] MRN: [id number] Date of Birth: October 09, 1970  Subjective/Objective:                   Met with patient to discuss discharge planning needs. Her husband was at bedside. Patient is self-pay and since loosing her health insurance. She was followed by HF clinic, etc however has not been since insurance change.  She is on [facility]. She has applied for disability however per patient that has been denied. Financial advisor is visiting with patient now. Patient states she walks with a walker. She has transportation.  Action/Plan:   Referral/application to [facility] and Medications management.   Expected Discharge Date:                  Expected Discharge Plan:     In-House Referral:     Discharge planning Services  CM Consult, [facility], Medication Assistance  Post Acute Care Choice:    Choice offered to:  Patient, Spouse  DME Arranged:    DME Agency:     HH Arranged:    [place] Agency:     Status of Service:  In process, will continue to follow  If discussed at Long Length of Stay Meetings, dates discussed:    Additional Comments:  Marshell Garfinkel, RN 11/08/2016, 11:26 AM

## 2016-11-08 NOTE — Progress Notes (Signed)
Patient was ordered lovenox 30 mg subq daily. [name] CrCl 67.8 ml/min Wt 149.3 kg BMI 59.5  Considering patient's BMI > 40 and CrCl > 30 ml/min will readjusted lovenox to 40 mg subq twice daily.  Tobie Lords, PharmD, BCPS Clinical Pharmacist 11/08/2016

## 2016-11-08 NOTE — Progress Notes (Signed)
eLink Physician-Brief Progress Note Patient Name: [name] DOB: 1970-09-02 MRN: [id number]   Date of Service  11/08/2016  HPI/Events of Note  46 yo woman, obese, HTN, combined CHF, CKD. Admitted with HTn and dyspnea. Has been off meds for several days due to cost. Currently on BiPAP. Looks sleepy but comfortable resp pattern. NTG and lasix initiated.  PCCM following. No new orders at this time.  eICU Interventions       Intervention Category Evaluation Type: New Patient Evaluation  Mirissa Lopresti S. 11/08/2016, 2:39 AM

## 2016-11-08 NOTE — ED Notes (Signed)
CCU unable to take report at this time, will call back in 25 min if CCU does not return call prior to.

## 2016-11-08 NOTE — Progress Notes (Signed)
Pt refuses to wear Bipap. Bipap remains at bedside.

## 2016-11-08 NOTE — Progress Notes (Signed)
Inpatient Diabetes Program Recommendations  AACE/ADA: New Consensus Statement on Inpatient Glycemic Control (2015)  Target Ranges:  Prepandial:   less than 140 mg/dL      Peak postprandial:   less than 180 mg/dL (1-2 hours)      Critically ill patients:  140 - 180 mg/dL  Results for [name], [name] AUNG (MRN [id number]) as of 11/08/2016 11:26  Ref. Range 11/07/2016 23:34 11/08/2016 02:16  Glucose-Capillary Latest Ref Range: 65 - 99 mg/dL 162 (H) 159 (H)   Results for [name], [name] (MRN [id number]) as of 11/08/2016 11:26  Ref. Range 06/27/2016 00:06  Hemoglobin A1C Latest Ref Range: 4.8 - 5.6 % 8.5 (H)   Review of Glycemic Control  Diabetes history: DM1 Outpatient Diabetes medications:  Tresiba 45 units QHS, Novolog sliding scale TID with meals Current orders for Inpatient glycemic control: Tresiba 45 units QHS, Novolog 0-9 units TID with meals, Novolog 0-5 units QHS  Inpatient Diabetes Program Recommendations: Insulin - Basal: Please discontinue order for Tyler Aas (do not have on formulary). Recommend ordering Lantus 15 units Q24H (based on 149 kg x 0.1 units) and adjusting as more data is collected. Correction (SSI): Noted Novolog correction scale ordered this morning. HgbA1C: Last A1C in the chart was 8.5% on 06/27/16. Please consider ordering an A1C to evaluate glycemic control over the past 2-3 months.  NOTE: Spoke with patient about diabetes and home regimen for diabetes control. Patient reports that she is followed by PCP for diabetes management and currently she takes Tresiba 45 units QHS, Novolog sliding scale TID with meals as an outpatient for diabetes control. Patient reports that she is taking insulin as prescribed and she last took [name] on Wednesday night and took Novolog 20 units twice on 11/07/16 prior to coming to the hospital. Patient states that she just recently lost her Medicaid coverage and she does not have any insurance at this time. Patient states that she has filed an appeal  in an attempt to get Medicaid reinstated. Patient reports that she has enough [name] and Novolog to last about 2 weeks. Patient states that she checks her glucose 2-3 times per day and that it is usually less than 200 mg/dl.  Discussed glucose and A1C goals. Discussed importance of checking CBGs and maintaining good CBG control to prevent long-term and short-term complications. Patient expressed concern about being able to see her provider and get her medications since she currently has no insurance. Financial advisor on unit to see patient regarding Medicaid and CM aware of needs (see CM note for details).  Encouraged patient to continue checking her glucose and taking DM medications as prescribed. Provided emotional support regarding concerns. Patient verbalized understanding of information discussed and he states that he has no further questions at this time related to diabetes.  Thanks, Barnie Alderman, RN, MSN, CDE Diabetes Coordinator Inpatient Diabetes Program [phone] (Team Pager)

## 2016-11-08 NOTE — Progress Notes (Signed)
Transported pt to ICU on Bipap without incident. Report given to ICU RT. 

## 2016-11-08 NOTE — Consult Note (Signed)
Cardiology Consult    Patient ID: [name] MRN: [id number], DOB/AGE: 1970/07/12   Admit date: 11/07/2016 Date of Consult: 11/08/2016  Primary Physician: [name], NP Primary Cardiologist: Johnny Bridge, MD / T. Jackelyn Hoehn, NP Requesting Provider: S. Posey Pronto, MD  Patient Profile    [name] is a 46 y.o. female with a history of HFpEF, CKD III, COPD/Asthma on home O2, HTN, DM II, obesity, and Iron deficiency/chronic blood loss anemia, who is being seen today for the evaluation of acute on chronic diast CHF at the request of Dr. Posey Pronto.  Past Medical History   Past Medical History:  Diagnosis Date  . (HFpEF) heart failure with preserved ejection fraction ([place])    a. 11/2014 Echo: EF 50-55%, Gr1 DD; b. 06/2016 Echo: EF 60-65%, no rwma, mod dil LA, nl RV, triv eff.  . Asthma   . CKD (chronic kidney disease), stage III   . COPD (chronic obstructive pulmonary disease) ([place])    a. Home O2.  . Diabetes mellitus without complication ([place])   . Hypertension   . Iron deficiency anemia    a. chronic blood loss->heavy menses.  . Morbid obesity ([place])   . Pericardial effusion    a. 11/2014 CT Chest: Mod effusion; b. 11/2014 Echo: small to mod circumferential effusion. No hemodynamic compromise.  [name] Poorly controlled diabetes mellitus ([place])    a. 11/2014 A1c 13.2.    Past Surgical History:  Procedure Laterality Date  . CESAREAN SECTION     times 3     Allergies  Allergies  Allergen Reactions  . Feraheme [Ferumoxytol] Itching  . Pollen Extract   . Sulfa Antibiotics Itching    History of Present Illness    46 y/o ? with the above complex PMH including HFpEF, HTN, obesity, DM II, noncompliance, CKD III, ashtma/COPD on home O2, and IDA.  She has prior admissions for HFpEF with nl EF in 11/2014 and more recently 06/2016.  She has been followed closely in CHF clinic and was last seen there on 09/23/2016.  Wt was 318 lbs that day on torsemide 40mg  daily.  She notes that she was  also using prn metolazone - maybe 2-3 x/wk.  She says that she continued to do well following that visit but recently, due to lack of funds, had to start stretching out her meds some.  She has only been taking torsemide 40 mg about 3x/wk over the past 2 wks.  In that setting, she began to note increasing abd girth and edema, along with progressive DOE and orthopnea.  Symptoms worsened over the past 3-4 days and wt elevated to 329 lbs.  She also began to experience constant retrosternal chest heaviness that was worse with certain position changes and deep breathing.  She has somewhat chronic chest wall and abdominal soreness.  Due to progressive Ss, she presented to the [facility] ED on 9/13.  There, ECG was non-acute, while BNP was elevated @ 369 and CXR showed CHF.  BP 227/128.  Trop was also mildly elevated w/ flat trend @ 0.06  0.06  0.06.  She was admitted to ICU for further eval and placed on contact precautions in the setting of + MRSA screen.  She received 40 of IV lasix last night and 20 this am.  I/O minus 177.8 so far.  Breathing somewhat improved though still volume up.  Inpatient Medications    . albuterol  2.5 mg Nebulization BID  . albuterol      .  amLODipine  10 mg Oral Daily  . aspirin EC  81 mg Oral Daily  . atorvastatin  20 mg Oral q1800  . budesonide  0.25 mg Nebulization BID  . citalopram  40 mg Oral Daily  . enoxaparin (LOVENOX) injection  40 mg Subcutaneous BID  . furosemide  20 mg Intravenous Daily  . hydrALAZINE  100 mg Oral Q8H  . insulin aspart  0-5 Units Subcutaneous QHS  . insulin aspart  0-9 Units Subcutaneous TID WC  . insulin degludec  45 Units Subcutaneous QHS  . metoprolol tartrate  75 mg Oral BID  . potassium chloride  10 mEq Oral BID  . simvastatin  40 mg Oral QHS  . sodium chloride flush  3 mL Intravenous Q12H  . torsemide  40 mg Oral Daily    Family History    Family History  Problem Relation Age of Onset  . Diabetes Mother   . Hypertension Mother   .  Hypertension Father   . Diabetes Father   . Hypertension Unknown   . Diabetes Unknown   . Breast cancer Maternal Aunt 22  . Breast cancer Maternal Aunt 34    Social History    Social History   Social History  . Marital status: Married    Spouse name: N/A  . Number of children: N/A  . Years of education: N/A   Occupational History  . Disabled    Social History Main Topics  . Smoking status: Never Smoker  . Smokeless tobacco: Never Used  . Alcohol use 0.0 oz/week     Comment: occ drink - <1/wk  . Drug use: No  . Sexual activity: Not on file   Other Topics Concern  . Not on file   Social History Narrative   Lives in [place] with her husband and 83 y/o child.  Three other children are grown and out of the house.  She is sedentary and does not routinely exercise.  On home O2.     Review of Systems    General:  No chills, fever, night sweats or weight changes.  Cardiovascular:  +++ chest soreness and heaviness.  +++ dyspnea on exertion, +++ abd and LE edema, +++ orthopnea, no palpitations, paroxysmal nocturnal dyspnea. Dermatological: No rash, lesions/masses Respiratory: No cough, +++ dyspnea Urologic: No hematuria, dysuria Abdominal:   No nausea, vomiting, diarrhea, bright red blood per rectum, melena, or hematemesis Neurologic:  No visual changes, wkns, changes in mental status. All other systems reviewed and are otherwise negative except as noted above.  Physical Exam    Blood pressure (!) 161/93, pulse 95, temperature 98.5 F (36.9 C), temperature source Oral, resp. rate (!) 27, height 5\' 4"  (1.626 m), weight (!) 329 lb 2.4 oz (149.3 kg), last menstrual period 09/06/2016, SpO2 (!) 89 %.  General: Pleasant, obese, NAD Psych: Normal affect. Neuro: Alert and oriented X 3. Moves all extremities spontaneously. HEENT: Normal  Neck: Supple, obese, without bruits.  Difficult to gauge jvp 2/2 girth. Lungs:  Resp regular and unlabored, markedly diminished breath sounds  bilat. Heart: RRR no s3, s4, or murmurs. Abdomen: obese, protuberant. Pitting abd eating. BS + x 4.  Extremities: No clubbing, cyanosis.  Trace lower ext edema. DP/PT/Radials 2+ and equal bilaterally.  Labs     Recent Labs  11/07/16 1949 11/08/16 0624 11/08/16 1043  TROPONINI 0.06* 0.06* 0.06*   Lab Results  Component Value Date   WBC 5.7 11/08/2016   HGB 9.9 (L) 11/08/2016   HCT 30.1 (L)  11/08/2016   MCV 87.4 11/08/2016   PLT 286 11/08/2016    Recent Labs Lab 11/08/16 0624  NA 142  K 3.4*  CL 97*  CO2 36*  BUN 20  CREATININE 1.66*  CALCIUM 8.3*  GLUCOSE 128*   Lab Results  Component Value Date   CHOL 232 (H) 01/03/2015   HDL 41 01/03/2015   LDLCALC 169 (H) 01/03/2015   TRIG 112 01/03/2015     Radiology Studies    Dg Chest Port 1 View  Result Date: 11/07/2016 CLINICAL DATA:  Complaint of central chest pain radiating to the left side of the chest and back x2 days. EXAM: PORTABLE CHEST 1 VIEW COMPARISON:  06/25/2016 FINDINGS: Low lung volumes with cardiomegaly, pulmonary vascular congestion and right effusion. No acute osseous abnormality. IMPRESSION: Low lung volumes are again noted with stable cardiomegaly and mild vascular congestion consistent CHF. Probable small right effusion. Electronically Signed   By: Ashley Royalty M.D.   On: 11/07/2016 20:35    ECG & Cardiac Imaging    RSR, 98, non-specific T changes.  Assessment & Plan    1.  Acute on chronic HFpEF:  Pt with h/o diast dysfxn and prior hospitalizations for CHF.  Nl EF by echo 06/2016 - admitted @ that time for multifactorial resp failure.  Readmitted 9/14 due to progressive dyspnea and volume overload in the setting of noncompliance over the past 2 wks- having to stretch meds secondary to finances.  She is on torsemide 40 daily @ home but has only been taking it about 3 days/wk over the past 10 days to 2 wks.  Also has metolazone @ home but hasn't taken recently.  Dry wt somewhere around 318 - currently ~  329 lbs per bedscale.  Very diminished breath sounds and significant abd edema.  I suspect she will need more than lasix 20 IV to get volume off.  I will increase to 80 IV BID.  Follow creat closely.  May need metolazone to get at abd edema.  She has bumped creat in the past with lasix and thus we will plan to transition to torsemide once closer to dry wt.  BP elevated @ 227/128 on admission  currently 161/93.  Follow with diuresis.  Cont amlodipine, hydralazine, and  blocker.  If no wheezing, we can try to transition off of metoprolol and onto either carvedilol or labetalol for better bp mgmt, though with tight airways, I am concerned that she would not tolerate.  May need clonidine if pressures don't improve with diuresis.  2.  Hypertensive Heart Dzs w/ CHF:  See discussion above.  Cont diuresis.  3.  Elevated troponin/Chest pain:  In setting of CHF.  Minimal elevation with flat trend.  Doubt ACS.  More likely demand ischemia in the setting of CHF.  Chest pain fairly atypical and worse with deep breathing and position changes.  Chest wall is also sore.  Body habitus prevents adequate noninvasive ischemic eval.  Cont asa, statin,  blocker.  4.  Asthma/COPD:  Poor air mvmt, though no active wheezing.  Inhalers per IM.  5.  CKD III: creat more or less @ baseline - improved from prior baseline.  Follow w/ diuresis.  6.  Morbid Obesity/OHS: needs outpt sleep study.  7.  IDA:  Stable.  Signed, Murray Hodgkins, NP 11/08/2016, 1:49 PM

## 2016-11-09 LAB — HEMOGLOBIN A1C
HEMOGLOBIN A1C: 7.2 % — AB (ref 4.8–5.6)
MEAN PLASMA GLUCOSE: 159.94 mg/dL

## 2016-11-09 LAB — BASIC METABOLIC PANEL
ANION GAP: 7 (ref 5–15)
BUN: 24 mg/dL — ABNORMAL HIGH (ref 6–20)
CO2: 36 mmol/L — AB (ref 22–32)
Calcium: 8.3 mg/dL — ABNORMAL LOW (ref 8.9–10.3)
Chloride: 97 mmol/L — ABNORMAL LOW (ref 101–111)
Creatinine, Ser: 2.06 mg/dL — ABNORMAL HIGH (ref 0.44–1.00)
GFR calc Af Amer: 32 mL/min — ABNORMAL LOW (ref >60)
GFR, EST NON AFRICAN AMERICAN: 28 mL/min — AB (ref >60)
GLUCOSE: 126 mg/dL — AB (ref 65–99)
POTASSIUM: 3.8 mmol/L (ref 3.5–5.1)
Sodium: 140 mmol/L (ref 135–145)

## 2016-11-09 LAB — GLUCOSE, CAPILLARY
GLUCOSE-CAPILLARY: 139 mg/dL — AB (ref 65–99)
GLUCOSE-CAPILLARY: 165 mg/dL — AB (ref 65–99)
Glucose-Capillary: 111 mg/dL — ABNORMAL HIGH (ref 65–99)
Glucose-Capillary: 179 mg/dL — ABNORMAL HIGH (ref 65–99)

## 2016-11-09 MED ORDER — DIPHENHYDRAMINE HCL 25 MG PO CAPS
25.0000 mg | ORAL_CAPSULE | Freq: Four times a day (QID) | ORAL | Status: DC | PRN
  Administered 2016-11-09: 25 mg via ORAL
  Filled 2016-11-09: qty 1

## 2016-11-09 NOTE — Consult Note (Addendum)
Progress Note  Patient Name: [name] Date of Encounter: 11/09/2016  Primary Cardiologist:   Dr. Rockey Situ  Subjective   Breathing is not improved.  She has chronic pain from her back and cannot sit upright.    Inpatient Medications    Scheduled Meds: . albuterol  2.5 mg Nebulization BID  . amLODipine  10 mg Oral Daily  . aspirin EC  81 mg Oral Daily  . budesonide  0.25 mg Nebulization BID  . citalopram  40 mg Oral Daily  . enoxaparin (LOVENOX) injection  40 mg Subcutaneous BID  . furosemide  80 mg Intravenous BID  . hydrALAZINE  100 mg Oral Q8H  . insulin aspart  0-5 Units Subcutaneous QHS  . insulin aspart  0-9 Units Subcutaneous TID WC  . insulin glargine  20 Units Subcutaneous QHS  . metoprolol tartrate  75 mg Oral BID  . potassium chloride  20 mEq Oral BID  . simvastatin  40 mg Oral QHS  . sodium chloride flush  3 mL Intravenous Q12H   Continuous Infusions: . sodium chloride    . nitroGLYCERIN Stopped (11/08/16 1030)   PRN Meds: sodium chloride, acetaminophen, hydrALAZINE, HYDROcodone-acetaminophen, labetalol, ondansetron (ZOFRAN) IV, polyethylene glycol, sodium chloride flush   Vital Signs    Vitals:   11/09/16 0359 11/09/16 0523 11/09/16 0740 11/09/16 1155  BP: 124/65 (!) 130/55  (!) 143/76  Pulse: 76 73  73  Resp: 18   18  Temp: 98.3 F (36.8 C)   98.7 F (37.1 C)  TempSrc: Oral   Oral  SpO2: 96%  96% 99%  Weight: (!) 329 lb 11.2 oz (149.6 kg)     Height:        Intake/Output Summary (Last 24 hours) at 11/09/16 1345 Last data filed at 11/09/16 0307  Gross per 24 hour  Intake                0 ml  Output              801 ml  Net             -801 ml   Filed Weights   11/07/16 1930 11/08/16 0200 11/09/16 0359  Weight: (!) 530 lb (240.4 kg) (!) 329 lb 2.4 oz (149.3 kg) (!) 329 lb 11.2 oz (149.6 kg)    Telemetry    NSR - Personally Reviewed  ECG    NA - Personally Reviewed  Physical Exam   GEN: She looks uncomfortable but not in  distress Neck: No  JVD Cardiac: Irregular RR, no murmurs, rubs, or gallops.  Respiratory: Clear  to auscultation bilaterally. GI: Soft, nontender, non-distended , morbidly obese MS:   Mild edema; No deformity. Neuro:  Nonfocal  Psych: Normal affect   Labs    Chemistry Recent Labs Lab 11/07/16 1949 11/08/16 0624 11/09/16 0557  NA 140 142 140  K 3.9 3.4* 3.8  CL 95* 97* 97*  CO2 37* 36* 36*  GLUCOSE 195* 128* 126*  BUN 20 20 24*  CREATININE 1.53* 1.66* 2.06*  CALCIUM 8.6* 8.3* 8.3*  GFRNONAA 40* 36* 28*  GFRAA 46* 42* 32*  ANIONGAP 8 9 7      Hematology Recent Labs Lab 11/07/16 1949 11/08/16 0624  WBC 5.7 5.7  RBC 3.96 3.44*  HGB 11.3* 9.9*  HCT 34.3* 30.1*  MCV 86.7 87.4  MCH 28.5 28.8  MCHC 32.9 32.9  RDW 15.8* 15.2*  PLT 291 286    Cardiac Enzymes Recent Labs  Lab 11/07/16 1949 11/08/16 0624 11/08/16 1043 11/08/16 1630  TROPONINI 0.06* 0.06* 0.06* 0.05*   No results for input(s): TROPIPOC in the last 168 hours.   BNP Recent Labs Lab 11/07/16 1949  BNP 369.0*     DDimer No results for input(s): DDIMER in the last 168 hours.   Radiology    Dg Chest Port 1 View  Result Date: 11/07/2016 CLINICAL DATA:  Complaint of central chest pain radiating to the left side of the chest and back x2 days. EXAM: PORTABLE CHEST 1 VIEW COMPARISON:  06/25/2016 FINDINGS: Low lung volumes with cardiomegaly, pulmonary vascular congestion and right effusion. No acute osseous abnormality. IMPRESSION: Low lung volumes are again noted with stable cardiomegaly and mild vascular congestion consistent CHF. Probable small right effusion. Electronically Signed   By: Ashley Royalty M.D.   On: 11/07/2016 20:35    Cardiac Studies   ECHO:  Pending  Patient Profile     46 y.o. female with a history of HFpEF, CKD III, COPD/Asthma on home O2, HTN, DM II, obesity, and Iron deficiency/chronic blood loss anemia, who is being seen for the evaluation of acute on chronic diast CHF at the  request of Dr. Posey Pronto.  Assessment & Plan    ACUTE ON CHRONIC HFpEF:    Not taking meds secondary to difficulty with finances.   Weight is unchanged.  She has not had a significant UO yet.   This is going to be a difficult situation.  She says that she is 10 lbs above her dry weight.  She cannot keep her feet up above her heart.  She has already bumped her creat without much UO.  I think we need to try to continue the current IV diuresis.  However, if her creat continues to increase she might need right heart cath to understand hemodynamics and how to proceed.    HTN:    BP was very elevated on presentation.  Much improved.    ELEVATED TROPONIN:   Troponin trend has been flat.  No evidence of ACS.  No further work up.   CKD III:  Creat has bumped slightly.  See above.   Signed, Minus Breeding, MD  11/09/2016, 1:45 PM

## 2016-11-09 NOTE — Plan of Care (Signed)
Problem: Physical Regulation: Goal: Ability to maintain clinical measurements within normal limits will improve Outcome: Not Progressing Potassium = only 3.4 today. Will continue to monitor. [name] [facility]

## 2016-11-09 NOTE — Progress Notes (Signed)
[place] [facility] [place] NAME: [name]    MR#:  [id number]  DATE OF BIRTH:  [birth year]  SUBJECTIVE:   Patient feels she is still with significant fluid in abdomen and lower extremities. REVIEW OF SYSTEMS:    Review of Systems  Constitutional: Negative for fever, chills weight loss HENT: Negative for ear pain, nosebleeds, congestion, facial swelling, rhinorrhea, neck pain, neck stiffness and ear discharge.   Respiratory: Negative for cough, shortness of breath, wheezing  Cardiovascular: Negative for chest pain, palpitations and Positive leg swelling.  Gastrointestinal: Negative for heartburn, abdominal pain, vomiting, diarrhea or consitpation Positive fluid in abdomen Genitourinary: Negative for dysuria, urgency, frequency, hematuria Musculoskeletal: Negative for back pain or joint pain Neurological: Negative for dizziness, seizures, syncope, focal weakness,  numbness and headaches.  Hematological: Does not bruise/bleed easily.  Psychiatric/Behavioral: Negative for hallucinations, confusion, dysphoric mood    Tolerating Diet: yes      DRUG ALLERGIES:   Allergies  Allergen Reactions  . Feraheme [Ferumoxytol] Itching  . Pollen Extract   . Sulfa Antibiotics Itching    VITALS:  Blood pressure (!) 130/55, pulse 73, temperature 98.3 F (36.8 C), temperature source Oral, resp. rate 18, height 5\' 4"  (1.626 m), weight (!) 149.6 kg (329 lb 11.2 oz), last menstrual period 09/06/2016, SpO2 96 %.  PHYSICAL EXAMINATION:  Constitutional: Appears well-developed and well-nourished. No distress. HENT: Normocephalic. [name] Oropharynx is clear and moist.  Eyes: Conjunctivae and EOM are normal. PERRLA, no scleral icterus.  Neck: Normal ROM. Neck supple. No JVD. No tracheal deviation. CVS: RRR, S1/S2 +, no murmurs, no gallops, no carotid bruit.  Pulmonary: Effort and Diminished breath sounds throughout, no stridor, rhonchi, wheezes, rales.   Abdominal: Soft. BS +,  Positive abdominal distension/swelling, no tenderness, rebound or guarding.  Musculoskeletal: Normal range of motion.2+ LEE  and no tenderness.  Neuro: Alert. CN 2-12 grossly intact. No focal deficits. Skin: Skin is warm and dry. No rash noted. Psychiatric: Normal mood and affect.      LABORATORY PANEL:   CBC  Recent Labs Lab 11/08/16 0624  WBC 5.7  HGB 9.9*  HCT 30.1*  PLT 286   ------------------------------------------------------------------------------------------------------------------  Chemistries   Recent Labs Lab 11/09/16 0557  NA 140  K 3.8  CL 97*  CO2 36*  GLUCOSE 126*  BUN 24*  CREATININE 2.06*  CALCIUM 8.3*   ------------------------------------------------------------------------------------------------------------------  Cardiac Enzymes  Recent Labs Lab 11/08/16 0624 11/08/16 1043 11/08/16 1630  TROPONINI 0.06* 0.06* 0.05*   ------------------------------------------------------------------------------------------------------------------  RADIOLOGY:  Dg Chest Port 1 View  Result Date: 11/07/2016 CLINICAL DATA:  Complaint of central chest pain radiating to the left side of the chest and back x2 days. EXAM: PORTABLE CHEST 1 VIEW COMPARISON:  06/25/2016 FINDINGS: Low lung volumes with cardiomegaly, pulmonary vascular congestion and right effusion. No acute osseous abnormality. IMPRESSION: Low lung volumes are again noted with stable cardiomegaly and mild vascular congestion consistent CHF. Probable small right effusion. Electronically Signed   By: Ashley Royalty M.D.   On: 11/07/2016 20:35     ASSESSMENT AND PLAN:   46 year old morbidly obese female who presented with shortness of breath.  1. Acute on chronic hypoxic respiratory failure in the setting of acute on chronic diastolic heart failure due to poor adherence to daily diuretic therapy Cardiology consultation is appreciated Continue 80 mg IV twice a day of  Lasix Monitor intake and output with daily weight She will need CHF clinic at discharge.   2. Elevated  troponin due to demand ischemia. Patient has been ruled out for ACS.  3. Hypertension: [name] blood pressure better controlled: Continue Norvasc, metoprolol and hydralazine  4. Hyperlipidemia: Continue statin  5. Diabetes: A1c in May was 8.5 Continue Lantus with sliding scale and ADA diet  6. Morbid obesity: Patient will need outpatient sleep evaluation  7. Iron deficiency anemia: Continue to monitor hemoglobin  8. COPD with chronic respiratory failure: No signs of exacerbation Management plans discussed with the patient and she is in agreement.  CODE STATUS: full  TOTAL TIME TAKING CARE OF THIS PATIENT: 28 minutes.     POSSIBLE D/C 2 days, DEPENDING ON CLINICAL CONDITION.   Rosella Crandell M.D on 11/09/2016 at 9:40 AM  Between 7am to 6pm - Pager - [phone] After 6pm go to [email or web address] - password [email or web address] [facility]  Office  [phone]  CC: Primary care physician; [name], NP  Note: This dictation was prepared with Dragon dictation along with smaller phrase technology. Any transcriptional errors that result from this process are unintentional.

## 2016-11-09 NOTE — Evaluation (Signed)
Physical Therapy Evaluation Patient Details Name: [name] MRN: [id number] DOB: 04-27-1970 Today's Date: 11/09/2016   History of Present Illness  Ptis a 46 y.o.femalewith a known history of asthma, COPD,diastolic congestive heart failure, CKD3, diabetes, hypertension, iron deficiency anemia, morbid obesitypresents to the emergency department for evaluation of shortness of breath. Patient was in a usual state of health until 3-4 days when she describes progressively worsening shortness of breath, dyspnea on exertion associated with left side and lateral chest pain, pain with inspiration. Patient states that she has been out of most of her medications for 3-4 days as she has not been able to obtain them secondary to cost and lack of insurance coverage.  Assessment includes: acute on chronic hypoxic respiratory failure in setting of acute on chronic diastolic heart failure, elevated troponin due to demand ischemia, HTN, HLD, DM, COPD, iron deficiency anemia, and COPD.     Clinical Impression  Pt presents with deficits in strength, transfers, mobility, gait, balance, and activity tolerance.  Pt required SBA with extra time and effort during bed mobility tasks with HOB elevated to assist with sup to sit.  Pt required CGA with transfers again with extra time and effort during tasks.  Pt able to take several small steps by EOB before fatiguing and requiring to return to sitting.  Pt's vital signs WNL while on 4LO2/min.  Stair assessment/training deferred this date secondary to fatigue with limited amb at EOB.  Pt will benefit from PT services in a SNF setting to safely address above deficits for decreased caregiver assistance and improved safety with functional mobility upon discharge for acute care.      Follow Up Recommendations SNF    Equipment Recommendations  Rolling walker with 5" wheels;Other (comment) (Bariatric rolling walker)    Recommendations for Other Services       Precautions  / Restrictions Precautions Precautions: Fall Restrictions Weight Bearing Restrictions: No      Mobility  Bed Mobility Overal bed mobility: Needs Assistance Bed Mobility: Supine to Sit;Sit to Supine     Supine to sit: Supervision Sit to supine: Supervision   General bed mobility comments: Extra time and effort with HOB elevated during bed mobility tasks  Transfers Overall transfer level: Needs assistance Equipment used: Rolling walker (2 wheeled) Transfers: Sit to/from Stand Sit to Stand: Min guard         General transfer comment: Effortful standing from elevated EOB  Ambulation/Gait Ambulation/Gait assistance: Min guard Ambulation Distance (Feet): 3 Feet Assistive device: Rolling walker (2 wheeled) Gait Pattern/deviations: Step-to pattern   Gait velocity interpretation: Below normal speed for age/gender General Gait Details: Pt fatigued quickly with limited ambulation at EOB with vital signs WNL while on 4LO2/min  Stairs Stairs:  (Deferred)          Wheelchair Mobility    Modified Rankin (Stroke Patients Only)       Balance Overall balance assessment: Needs assistance Sitting-balance support: Feet unsupported;Feet supported;Bilateral upper extremity supported;No upper extremity supported Sitting balance-Leahy Scale: Good     Standing balance support: Bilateral upper extremity supported Standing balance-Leahy Scale: Good                               Pertinent Vitals/Pain Pain Assessment: 0-10 Pain Score: 7  Pain Location: Back pain and BLE pain Pain Descriptors / Indicators: Aching;Sore Pain Intervention(s): Limited activity within patient's tolerance;Monitored during session;Premedicated before session    Home Living Family/patient expects  to be discharged to:: Private residence Living Arrangements: Spouse/significant other Available Help at Discharge: Personal care attendant;Family;Available PRN/intermittently Type of Home:  House Home Access: Stairs to enter Entrance Stairs-Rails: None Entrance Stairs-Number of Steps: 4 Home Layout: One level Home Equipment: Walker - 4 wheels      Prior Function Level of Independence: Needs assistance   Gait / Transfers Assistance Needed: SBA with amb HH distances with occasional min A for stability up/down stairs, w/c for community access, 1 recent fall while descending the stairs without injury  ADL's / Homemaking Assistance Needed: Assistance with ADLs from home health aide 5 days per week for 3 hrs/day        Hand Dominance        Extremity/Trunk Assessment   Upper Extremity Assessment Upper Extremity Assessment: Generalized weakness    Lower Extremity Assessment Lower Extremity Assessment: Generalized weakness       Communication   Communication: No difficulties  Cognition Arousal/Alertness: Awake/alert Behavior During Therapy: WFL for tasks assessed/performed Overall Cognitive Status: Within Functional Limits for tasks assessed                                        General Comments      Exercises Total Joint Exercises Ankle Circles/Pumps: AROM;Both;5 reps;10 reps Quad Sets: Strengthening;Both;10 reps;5 reps Gluteal Sets: Strengthening;Both;5 reps;10 reps Long Arc Quad: AROM;Both;5 reps;10 reps Knee Flexion: AROM;Both;5 reps;10 reps Marching in Standing: AROM;Both;10 reps Other Exercises Other Exercises: HEP edcuation/review with BLE APs, QS, and GS x 10 each 5-6x/day   Assessment/Plan    PT Assessment Patient needs continued PT services  PT Problem List Decreased strength;Decreased activity tolerance;Decreased balance;Decreased mobility       PT Treatment Interventions DME instruction;Gait training;Stair training;Functional mobility training;Neuromuscular re-education;Balance training;Therapeutic exercise;Therapeutic activities;Patient/family education    PT Goals (Current goals can be found in the Care Plan section)   Acute Rehab PT Goals Patient Stated Goal: To walk better PT Goal Formulation: With patient Time For Goal Achievement: 11/22/16 Potential to Achieve Goals: Good    Frequency Min 2X/week   Barriers to discharge Inaccessible home environment      Co-evaluation               AM-PAC PT "6 Clicks" Daily Activity  Outcome Measure Difficulty turning over in bed (including adjusting bedclothes, sheets and blankets)?: A Little Difficulty moving from lying on back to sitting on the side of the bed? : A Little Difficulty sitting down on and standing up from a chair with arms (e.g., wheelchair, bedside commode, etc,.)?: Unable Help needed moving to and from a bed to chair (including a wheelchair)?: A Little Help needed walking in hospital room?: A Lot Help needed climbing 3-5 steps with a railing? : Total 6 Click Score: 13    End of Session Equipment Utilized During Treatment: Gait belt;Oxygen Activity Tolerance: Patient limited by fatigue Patient left: in bed;with bed alarm set;with call bell/phone within reach;with family/visitor present Nurse Communication: Mobility status PT Visit Diagnosis: Difficulty in walking, not elsewhere classified (R26.2);Muscle weakness (generalized) (M62.81)    Time: 1040-1106 PT Time Calculation (min) (ACUTE ONLY): 26 min   Charges:   PT Evaluation $PT Eval Low Complexity: 1 Low PT Treatments $Therapeutic Exercise: 8-22 mins   PT G Codes:   PT G-Codes **NOT FOR INPATIENT CLASS** Functional Assessment Tool Used: AM-PAC 6 Clicks Basic Mobility Functional Limitation: Mobility: Walking and moving  around Mobility: Walking and Moving Around Current Status [phone]): At least 40 percent but less than 60 percent impaired, limited or restricted Mobility: Walking and Moving Around Goal Status [phone]): At least 1 percent but less than 20 percent impaired, limited or restricted    D. Scott Anarely Nicholls PT, DPT 11/09/16, 2:07 PM

## 2016-11-10 LAB — BASIC METABOLIC PANEL
ANION GAP: 7 (ref 5–15)
BUN: 30 mg/dL — ABNORMAL HIGH (ref 6–20)
CALCIUM: 8.1 mg/dL — AB (ref 8.9–10.3)
CO2: 36 mmol/L — ABNORMAL HIGH (ref 22–32)
Chloride: 96 mmol/L — ABNORMAL LOW (ref 101–111)
Creatinine, Ser: 2.14 mg/dL — ABNORMAL HIGH (ref 0.44–1.00)
GFR calc Af Amer: 31 mL/min — ABNORMAL LOW (ref >60)
GFR, EST NON AFRICAN AMERICAN: 27 mL/min — AB (ref >60)
GLUCOSE: 97 mg/dL (ref 65–99)
Potassium: 3.9 mmol/L (ref 3.5–5.1)
SODIUM: 139 mmol/L (ref 135–145)

## 2016-11-10 LAB — URINALYSIS, ROUTINE W REFLEX MICROSCOPIC
BACTERIA UA: NONE SEEN
BILIRUBIN URINE: NEGATIVE
Glucose, UA: NEGATIVE mg/dL
HGB URINE DIPSTICK: NEGATIVE
Ketones, ur: NEGATIVE mg/dL
LEUKOCYTES UA: NEGATIVE
Nitrite: NEGATIVE
Specific Gravity, Urine: 1.011 (ref 1.005–1.030)
pH: 5 (ref 5.0–8.0)

## 2016-11-10 LAB — GLUCOSE, CAPILLARY
GLUCOSE-CAPILLARY: 88 mg/dL (ref 65–99)
GLUCOSE-CAPILLARY: 105 mg/dL — AB (ref 65–99)
Glucose-Capillary: 170 mg/dL — ABNORMAL HIGH (ref 65–99)
Glucose-Capillary: 239 mg/dL — ABNORMAL HIGH (ref 65–99)

## 2016-11-10 LAB — PROTEIN / CREATININE RATIO, URINE
Creatinine, Urine: 135 mg/dL
Protein Creatinine Ratio: 2.16 mg/mg{Cre} — ABNORMAL HIGH (ref 0.00–0.15)
TOTAL PROTEIN, URINE: 291 mg/dL

## 2016-11-10 MED ORDER — METOLAZONE 2.5 MG PO TABS
2.5000 mg | ORAL_TABLET | Freq: Once | ORAL | Status: AC
  Administered 2016-11-10: 2.5 mg via ORAL
  Filled 2016-11-10: qty 1

## 2016-11-10 MED ORDER — GUAIFENESIN-DM 100-10 MG/5ML PO SYRP
5.0000 mL | ORAL_SOLUTION | ORAL | Status: DC | PRN
  Administered 2016-11-10 – 2016-11-16 (×14): 5 mL via ORAL
  Filled 2016-11-10 (×14): qty 5

## 2016-11-10 NOTE — Clinical Social Work Note (Signed)
CSW met with the patient and her husband at bedside to discuss discharge planning. The patient reports that she declines SNF but is willing to pursue home health. The CSW updated the [facility] of such. The patient may benefit from [place] to assist with reapplying for Medicaid. CSW is signing off.  Santiago Bumpers, MSW, Latanya Presser [phone]

## 2016-11-10 NOTE — Progress Notes (Signed)
Progress Note  Patient Name: [name] Date of Encounter: 11/10/2016  Primary Cardiologist: Dr. Rockey Situ  Subjective   She is having some cough with mucous.  She has had no chest pain but her legs are aching.    Inpatient Medications    Scheduled Meds: . albuterol  2.5 mg Nebulization BID  . amLODipine  10 mg Oral Daily  . aspirin EC  81 mg Oral Daily  . budesonide  0.25 mg Nebulization BID  . citalopram  40 mg Oral Daily  . enoxaparin (LOVENOX) injection  40 mg Subcutaneous BID  . furosemide  80 mg Intravenous BID  . hydrALAZINE  100 mg Oral Q8H  . insulin aspart  0-5 Units Subcutaneous QHS  . insulin aspart  0-9 Units Subcutaneous TID WC  . insulin glargine  20 Units Subcutaneous QHS  . metoprolol tartrate  75 mg Oral BID  . potassium chloride  20 mEq Oral BID  . simvastatin  40 mg Oral QHS  . sodium chloride flush  3 mL Intravenous Q12H   Continuous Infusions: . sodium chloride    . nitroGLYCERIN Stopped (11/08/16 1030)   PRN Meds: sodium chloride, acetaminophen, diphenhydrAMINE, hydrALAZINE, HYDROcodone-acetaminophen, labetalol, ondansetron (ZOFRAN) IV, polyethylene glycol, sodium chloride flush   Vital Signs    Vitals:   11/10/16 0500 11/10/16 0508 11/10/16 0806 11/10/16 0900  BP:  139/82  124/67  Pulse:  73  76  Resp:  18  17  Temp:  98.4 F (36.9 C)  98.6 F (37 C)  TempSrc:  Oral  Oral  SpO2:  97% 96% 97%  Weight: (!) 330 lb 7 oz (149.9 kg)     Height:        Intake/Output Summary (Last 24 hours) at 11/10/16 1311 Last data filed at 11/10/16 0207  Gross per 24 hour  Intake                0 ml  Output                0 ml  Net                0 ml   Filed Weights   11/08/16 0200 11/09/16 0359 11/10/16 0500  Weight: (!) 329 lb 2.4 oz (149.3 kg) (!) 329 lb 11.2 oz (149.6 kg) (!) 330 lb 7 oz (149.9 kg)    Telemetry    NSR - Personally Reviewed  ECG    NA - Personally Reviewed  Physical Exam   GEN: No acute distress.   Neck: No   JVD Cardiac: RRR, NO murmurs, rubs, or gallops.  Respiratory: Clear  to auscultation bilaterally. GI: Soft, nontender, non-distended  MS: Moderate leg edema; No deformity. Neuro:  Nonfocal  Psych: Normal affect   Labs    Chemistry Recent Labs Lab 11/08/16 0624 11/09/16 0557 11/10/16 0419  NA 142 140 139  K 3.4* 3.8 3.9  CL 97* 97* 96*  CO2 36* 36* 36*  GLUCOSE 128* 126* 97  BUN 20 24* 30*  CREATININE 1.66* 2.06* 2.14*  CALCIUM 8.3* 8.3* 8.1*  GFRNONAA 36* 28* 27*  GFRAA 42* 32* 31*  ANIONGAP 9 7 7      Hematology Recent Labs Lab 11/07/16 1949 11/08/16 0624  WBC 5.7 5.7  RBC 3.96 3.44*  HGB 11.3* 9.9*  HCT 34.3* 30.1*  MCV 86.7 87.4  MCH 28.5 28.8  MCHC 32.9 32.9  RDW 15.8* 15.2*  PLT 291 286    Cardiac Enzymes Recent  Labs Lab 11/07/16 1949 11/08/16 0624 11/08/16 1043 11/08/16 1630  TROPONINI 0.06* 0.06* 0.06* 0.05*   No results for input(s): TROPIPOC in the last 168 hours.   BNP Recent Labs Lab 11/07/16 1949  BNP 369.0*     DDimer No results for input(s): DDIMER in the last 168 hours.   Radiology    No results found.  Cardiac Studies   ECHO:  Pending.    Patient Profile     46 y.o. female with a history of HFpEF, CKD III, COPD/Asthma on home O2, HTN, DM II, obesity, and Iron deficiency/chronic blood loss anemia, who is being seen for the evaluation of acute on chronic diast CHFat the request of Dr. Posey Pronto.  Assessment & Plan    ACUTE ON CHRONIC HFpEF:    Her weight is unchanged.  Intake and output is incomplete.  From her weight it would suggest that she is not intravascularly decongested.  I think she will tolerate continued IV Lasix and I will give a dose of Zaroxolyn which she takes at home.  I will order compression stockings.  I talked with the staff about the importance of strict intake and output measurements.    HTN:    BP was very elevated on presentation.  Normotensive.    ELEVATED TROPONIN:   Troponin trend was flat.  No  evidence of ACS.  No further work up.   CKD III:  Creat is up very slightly.  However, seems to be tolerating Lasix at current dose.  Plan as above.    Signed, Minus Breeding, MD  11/10/2016, 1:11 PM

## 2016-11-10 NOTE — Clinical Social Work Note (Signed)
CSW received consult for SNF per PT recommendation. CSW will assess when able.  Santiago Bumpers, MSW, Latanya Presser [phone]

## 2016-11-10 NOTE — Progress Notes (Signed)
Patient seen for svn treatment. No distress noted. Continues to use o2 at 4 liters. Vitals stable. Patient reports breathing better. Patient had refused bipap therapy for last two nights.

## 2016-11-10 NOTE — Progress Notes (Signed)
[place] [place] NAME: [name]    MR#:  [id number]  DATE OF BIRTH:  [birth year]  SUBJECTIVE:   Patient Still with some shortness of breath and lower extremity edema REVIEW OF SYSTEMS:    Review of Systems  Constitutional: Negative for fever, chills weight loss HENT: Negative for ear pain, nosebleeds, congestion, facial swelling, rhinorrhea, neck pain, neck stiffness and ear discharge.   Respiratory: Negative for cough, Positive shortness of breath, no wheezing  Cardiovascular: Negative for chest pain, palpitations and Positive leg swelling.  Gastrointestinal: Negative for heartburn, abdominal pain, vomiting, diarrhea or consitpation Positive fluid in abdomen Genitourinary: Negative for dysuria, urgency, frequency, hematuria Musculoskeletal: Negative for back pain or joint pain Neurological: Negative for dizziness, seizures, syncope, focal weakness,  numbness and headaches.  Hematological: Does not bruise/bleed easily.  Psychiatric/Behavioral: Negative for hallucinations, confusion, dysphoric mood    Tolerating Diet: yes      DRUG ALLERGIES:   Allergies  Allergen Reactions  . Feraheme [Ferumoxytol] Itching  . Pollen Extract   . Sulfa Antibiotics Itching    VITALS:  Blood pressure 139/82, pulse 97, temperature 98.6 F (37 C), temperature source Oral, resp. rate 17, height 5\' 4"  (1.626 m), weight (!) 149.9 kg (330 lb 7 oz), last menstrual period 09/06/2016, SpO2 97 %.  PHYSICAL EXAMINATION:  Constitutional: Appears well-developed and well-nourished. No distress. HENT: Normocephalic. [name] Oropharynx is clear and moist.  Eyes: Conjunctivae and EOM are normal. PERRLA, no scleral icterus.  Neck: Normal ROM. Neck supple. No JVD. No tracheal deviation. CVS: RRR, S1/S2 +, no murmurs, no gallops, no carotid bruit.  Pulmonary: Effort and Diminished breath sounds throughout, no stridor, rhonchi, wheezes, rales.  Abdominal: Soft. BS  +,  Positive abdominal distension/swelling, no tenderness, rebound or guarding.  Musculoskeletal: Normal range of motion.2+ LEE  and no tenderness.  Neuro: Alert. CN 2-12 grossly intact. No focal deficits. Skin: Skin is warm and dry. No rash noted. Psychiatric: Normal mood and affect.      LABORATORY PANEL:   CBC  Recent Labs Lab 11/08/16 0624  WBC 5.7  HGB 9.9*  HCT 30.1*  PLT 286   ------------------------------------------------------------------------------------------------------------------  Chemistries   Recent Labs Lab 11/10/16 0419  NA 139  K 3.9  CL 96*  CO2 36*  GLUCOSE 97  BUN 30*  CREATININE 2.14*  CALCIUM 8.1*   ------------------------------------------------------------------------------------------------------------------  Cardiac Enzymes  Recent Labs Lab 11/08/16 0624 11/08/16 1043 11/08/16 1630  TROPONINI 0.06* 0.06* 0.05*   ------------------------------------------------------------------------------------------------------------------  RADIOLOGY:  No results found.   ASSESSMENT AND PLAN:   46 year old morbidly obese female who presented with shortness of breath.  1. Acute on chronic hypoxic respiratory failure in the setting of acute on chronic diastolic heart failure due to poor adherence to daily diuretic therapy Cardiology consultation is appreciated Continue 80 mg IV twice a day of Lasix Consider adding torsemide or Bumex Cardiac catheterization is being considered to evaluate right heart and left heart pressures given increased creatinine with diuresis. Monitor intake and output with daily weight She will need CHF clinic at discharge.   2. Elevated troponin due to demand ischemia. Patient has been ruled out for ACS.  3. Hypertension: [name] blood pressure better controlled: Continue Norvasc, metoprolol and hydralazine  4. Hyperlipidemia: Continue statin  5. Diabetes: A1c in May was 8.5 Continue Lantus with sliding  scale and ADA diet  6. Morbid obesity: Patient will need outpatient sleep evaluation  7. Iron deficiency anemia: Continue to monitor  hemoglobin  8. COPD with chronic respiratory failure: No signs of exacerbation 9. Acute on chronic kidney disease stage III: Creatinine increasing in the setting of IV Lasix and diuresis. Renal consult BMP in am   Management plans discussed with the patient and she is in agreement.  CODE STATUS: full  TOTAL TIME TAKING CARE OF THIS PATIENT: 28 minutes.     POSSIBLE D/C 2-4  days, DEPENDING ON CLINICAL CONDITION.   Mackson Botz M.D on 11/10/2016 at 9:27 AM  Between 7am to 6pm - Pager - [phone] After 6pm go to [email or web address] - password [email or web address] [facility]  Office  [phone]  CC: Primary care physician; [name], NP  Note: This dictation was prepared with Dragon dictation along with smaller phrase technology. Any transcriptional errors that result from this process are unintentional.

## 2016-11-10 NOTE — Progress Notes (Signed)
[facility], [place] 11/10/16  Subjective:   Patient known to our practice from previous admission during summer of 2018, when she was seen for acute renal failure and proteinuria. Patient has poorly controlled diabetes. She has proteinuria 5 g, which is thought to be secondary to diabetes. This time, she presents to the emergency department for shortness of breath, dyspnea on exertion and left-sided chest pain. She states that she lost her Medicaid coverage recently therefore could not afford her medications Presenting blood pressure to the emergency room was 227/128 After medications were restarted, this morning her blood pressure is 124/67 She still reports discomfort in her chest off and on Able to eat without nausea or vomiting No dyspnea at present. She reports pain over her lower abdomen and pannus. She has significant abdominal wall edema  Her baseline creatinine is 1.53 at the time of presentation. GFR 46 It has progressively increased. Today's creatinine is 2.14.   Objective:  Vital signs in last 24 hours:  Temp:  [98.4 F (36.9 C)-98.6 F (37 C)] 98.6 F (37 C) (09/16 0900) Pulse Rate:  [73-78] 76 (09/16 0900) Resp:  [17-18] 17 (09/16 0900) BP: (111-139)/(67-95) 124/67 (09/16 0900) SpO2:  [96 %-98 %] 97 % (09/16 0900) Weight:  [149.9 kg (330 lb 7 oz)] 149.9 kg (330 lb 7 oz) (09/16 0500)  Weight change: 0.335 kg (11.8 oz) Filed Weights   11/08/16 0200 11/09/16 0359 11/10/16 0500  Weight: (!) 149.3 kg (329 lb 2.4 oz) (!) 149.6 kg (329 lb 11.2 oz) (!) 149.9 kg (330 lb 7 oz)    Intake/Output:    Intake/Output Summary (Last 24 hours) at 11/10/16 1305 Last data filed at 11/10/16 0207  Gross per 24 hour  Intake                0 ml  Output                0 ml  Net                0 ml     Physical Exam: General: Morbidly obese female, laying in the bed   HEENT Anicteric, moist oral mucous membranes   Neck No JVD   Pulm/lungs Normal  breathing effort, clear to auscultation   CVS/Heart Regular, no rub or gallop   Abdomen:  Soft, nontender, obese, dependent edema over pannus   Extremities: + Edema   Neurologic: Alert, oriented   Skin: Abdominal wall edema           Basic Metabolic Panel:   Recent Labs Lab 11/07/16 1949 11/08/16 0624 11/09/16 0557 11/10/16 0419  NA 140 142 140 139  K 3.9 3.4* 3.8 3.9  CL 95* 97* 97* 96*  CO2 37* 36* 36* 36*  GLUCOSE 195* 128* 126* 97  BUN 20 20 24* 30*  CREATININE 1.53* 1.66* 2.06* 2.14*  CALCIUM 8.6* 8.3* 8.3* 8.1*     CBC:  Recent Labs Lab 11/07/16 1949 11/08/16 0624  WBC 5.7 5.7  HGB 11.3* 9.9*  HCT 34.3* 30.1*  MCV 86.7 87.4  PLT 291 286     No results found for: HEPBSAG, HEPBSAB, HEPBIGM    Microbiology:  Recent Results (from the past 240 hour(s))  MRSA PCR Screening     Status: Abnormal   Collection Time: 11/08/16  4:57 AM  Result Value Ref Range Status   MRSA by PCR POSITIVE (A) NEGATIVE Final    Comment:  The GeneXpert MRSA Assay (FDA approved for NASAL specimens only), is one component of a comprehensive MRSA colonization surveillance program. It is not intended to diagnose MRSA infection nor to guide or monitor treatment for MRSA infections. RESULT CALLED TO, READ BACK BY AND VERIFIED WITH:  RENEE BABB AT 0627 11/08/16 SDR     Coagulation Studies: No results for input(s): LABPROT, INR in the last 72 hours.  Urinalysis: No results for input(s): COLORURINE, LABSPEC, PHURINE, GLUCOSEU, HGBUR, BILIRUBINUR, KETONESUR, PROTEINUR, UROBILINOGEN, NITRITE, LEUKOCYTESUR in the last 72 hours.  Invalid input(s): APPERANCEUR    Imaging: No results found.   Medications:   . sodium chloride    . nitroGLYCERIN Stopped (11/08/16 1030)   . albuterol  2.5 mg Nebulization BID  . amLODipine  10 mg Oral Daily  . aspirin EC  81 mg Oral Daily  . budesonide  0.25 mg Nebulization BID  . citalopram  40 mg Oral Daily  . enoxaparin  (LOVENOX) injection  40 mg Subcutaneous BID  . furosemide  80 mg Intravenous BID  . hydrALAZINE  100 mg Oral Q8H  . insulin aspart  0-5 Units Subcutaneous QHS  . insulin aspart  0-9 Units Subcutaneous TID WC  . insulin glargine  20 Units Subcutaneous QHS  . metoprolol tartrate  75 mg Oral BID  . potassium chloride  20 mEq Oral BID  . simvastatin  40 mg Oral QHS  . sodium chloride flush  3 mL Intravenous Q12H   sodium chloride, acetaminophen, diphenhydrAMINE, hydrALAZINE, HYDROcodone-acetaminophen, labetalol, ondansetron (ZOFRAN) IV, polyethylene glycol, sodium chloride flush  Assessment/ Plan:  46 y.o. African-American female with morbid obesity, insulin-dependent diabetes, chronic kidney disease stage III, concentric left ventricular hypertrophy, COPD, hypertension, proteinuria She presents for accelerated hypertension due to no medications for several days after she lost her insurance coverage  1. Acute kidney injury 2. Chronic kidney disease stage III. Baseline creatinine 1.53/GFR 46 from 11/07/2016 3. Proteinuria, 5 g in May 2018. ANA negative in October 2016, SPEP negative for M spike 06/2016 4. Hypertension and dependent edema 5. Morbid obesity 6. Diabetes type 2 with nephropathy  Her acute kidney injury is likely secondary to accelerated hypertension at the time of presentation. With current regimen, her blood pressure is well-controlled now. Continue to monitor closely. Patient is getting IV furosemide for anasarca and abdominal wall edema. Follow low salt diet. Monitor serum electrolytes. Avoid hypotension Proteinuria appears to be secondary to diabetes. Previous workup includes negative ANA and negative SPEP Patient has poorly controlled diabetes due to inability to afford insulin and Meds. Management as per hospitalist team.  Patient is currently not on ACE inhibitor or ARB. Low dose ACE inhibitor or ARB can be considered once serum creatinine starts to improve   LOS:  3 [facility] 9/16/20181:05 PM  [place], [place]

## 2016-11-11 ENCOUNTER — Inpatient Hospital Stay: Payer: Medicaid Other

## 2016-11-11 LAB — BASIC METABOLIC PANEL
Anion gap: 6 (ref 5–15)
BUN: 36 mg/dL — AB (ref 6–20)
CALCIUM: 7.8 mg/dL — AB (ref 8.9–10.3)
CHLORIDE: 94 mmol/L — AB (ref 101–111)
CO2: 36 mmol/L — AB (ref 22–32)
CREATININE: 2.32 mg/dL — AB (ref 0.44–1.00)
GFR calc non Af Amer: 24 mL/min — ABNORMAL LOW (ref >60)
GFR, EST AFRICAN AMERICAN: 28 mL/min — AB (ref >60)
Glucose, Bld: 188 mg/dL — ABNORMAL HIGH (ref 65–99)
Potassium: 3.9 mmol/L (ref 3.5–5.1)
Sodium: 136 mmol/L (ref 135–145)

## 2016-11-11 LAB — GLUCOSE, CAPILLARY
GLUCOSE-CAPILLARY: 155 mg/dL — AB (ref 65–99)
GLUCOSE-CAPILLARY: 248 mg/dL — AB (ref 65–99)
Glucose-Capillary: 147 mg/dL — ABNORMAL HIGH (ref 65–99)
Glucose-Capillary: 259 mg/dL — ABNORMAL HIGH (ref 65–99)

## 2016-11-11 LAB — PREGNANCY, URINE: Preg Test, Ur: NEGATIVE

## 2016-11-11 IMAGING — DX DG CHEST 1V
1 series · 1 of 1 positions shown · non-contrast
Comparison: Radiographs [DATE].

CLINICAL DATA: Acute exacerbation of congestive heart failure.

EXAM:
CHEST 1 VIEW

[chest ap]
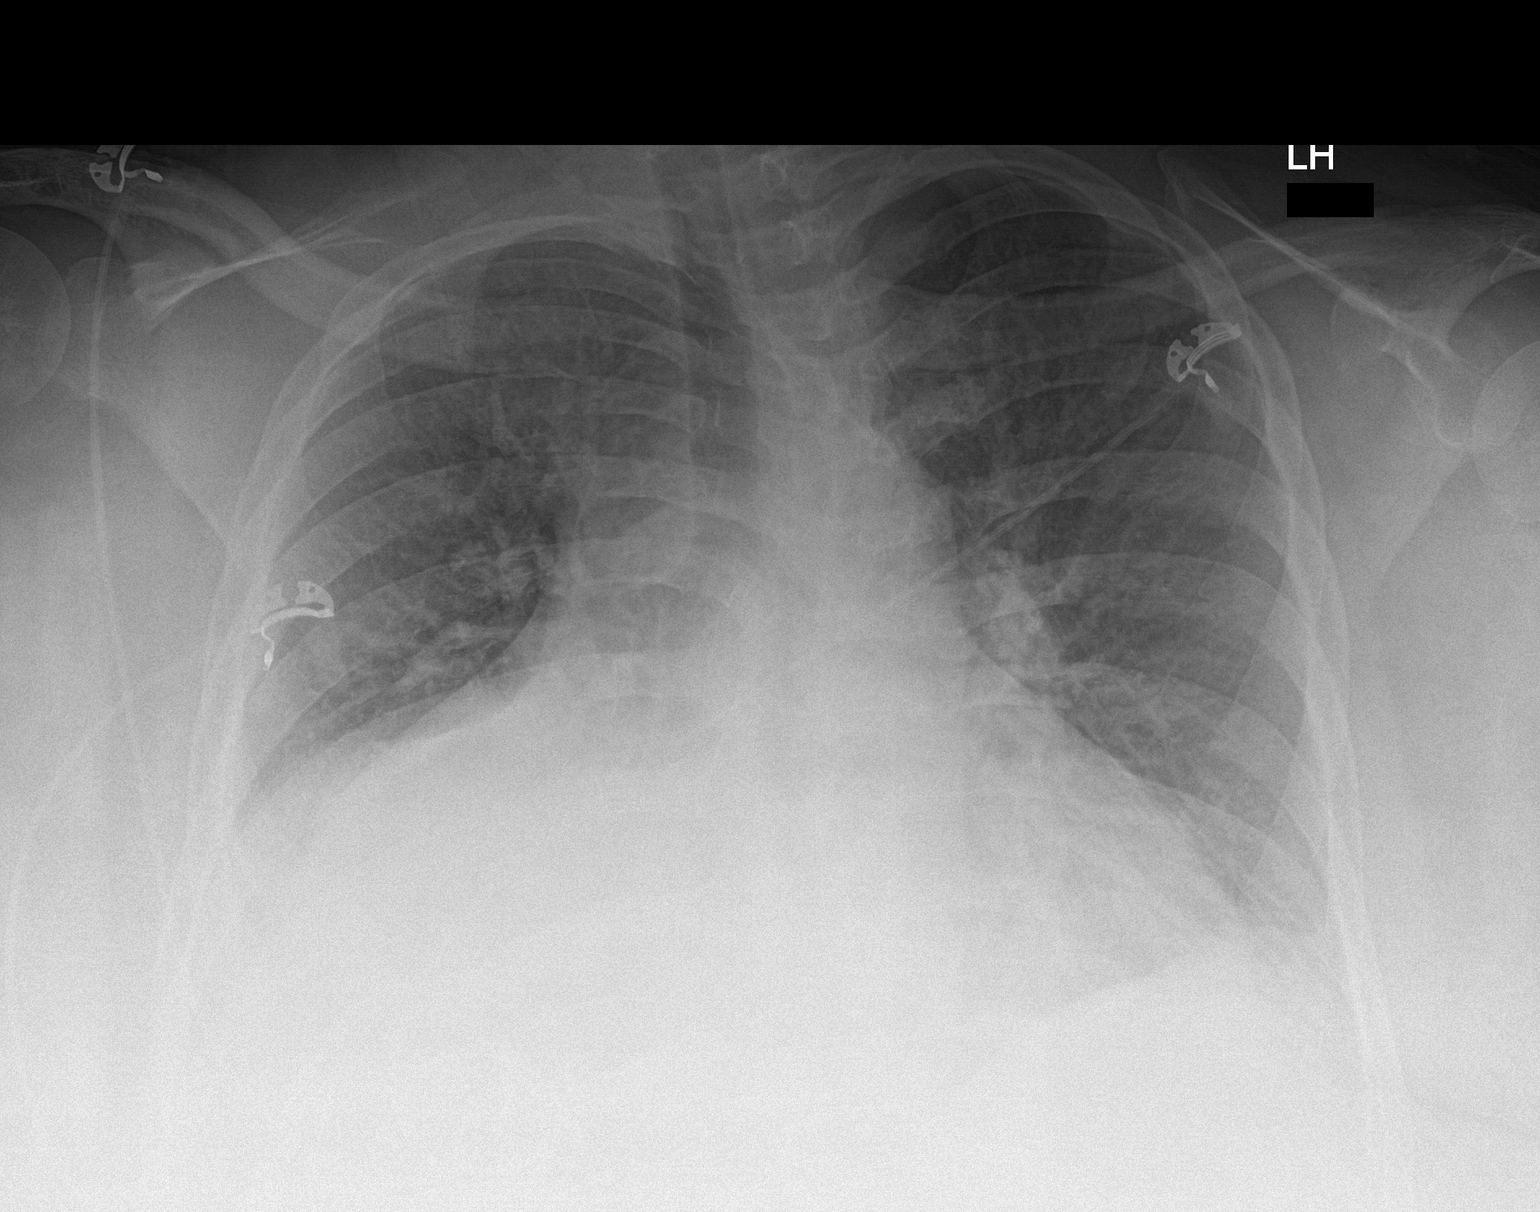

[1 of 1 positions shown; findings below may reference images not displayed]

FINDINGS: Stable cardiomegaly and central pulmonary vascular congestion is
noted. No pneumothorax is noted. Stable elevated right hemidiaphragm
is noted. Bony thorax is unremarkable. Probable small right pleural
effusion is noted.
IMPRESSION: Stable cardiomegaly and central pulmonary vascular congestion.
Probable small right pleural effusion.

## 2016-11-11 MED ORDER — MUPIROCIN 2 % EX OINT
1.0000 "application " | TOPICAL_OINTMENT | Freq: Two times a day (BID) | CUTANEOUS | Status: AC
  Administered 2016-11-11 – 2016-11-15 (×10): 1 via NASAL
  Filled 2016-11-11: qty 22

## 2016-11-11 MED ORDER — CHLORHEXIDINE GLUCONATE CLOTH 2 % EX PADS
6.0000 | MEDICATED_PAD | Freq: Every day | CUTANEOUS | Status: AC
  Administered 2016-11-11 – 2016-11-12 (×2): 6 via TOPICAL

## 2016-11-11 MED ORDER — INFLUENZA VAC SPLIT QUAD 0.5 ML IM SUSY
0.5000 mL | PREFILLED_SYRINGE | INTRAMUSCULAR | Status: AC
  Administered 2016-11-14: 0.5 mL via INTRAMUSCULAR
  Filled 2016-11-11: qty 0.5

## 2016-11-11 NOTE — Progress Notes (Signed)
Decreased to 3l after neb

## 2016-11-11 NOTE — Progress Notes (Signed)
[facility], [place] 11/11/16  Subjective:  Kidney function has been worsening over the admission. Creatinine currently 2.3. Suspect that alterations in perfusion pressure have decreased renal function.   Objective:  Vital signs in last 24 hours:  Temp:  [98 F (36.7 C)-98.7 F (37.1 C)] 98.7 F (37.1 C) (09/17 0759) Pulse Rate:  [70-87] 74 (09/17 1211) Resp:  [17-20] 17 (09/17 0759) BP: (141-153)/(75-90) 144/81 (09/17 1211) SpO2:  [93 %-99 %] 93 % (09/17 1211) Weight:  [151.4 kg (333 lb 12.8 oz)] 151.4 kg (333 lb 12.8 oz) (09/17 0444)  Weight change: 1.525 kg (3 lb 5.8 oz) Filed Weights   11/09/16 0359 11/10/16 0500 11/11/16 0444  Weight: (!) 149.6 kg (329 lb 11.2 oz) (!) 149.9 kg (330 lb 7 oz) (!) 151.4 kg (333 lb 12.8 oz)    Intake/Output:    Intake/Output Summary (Last 24 hours) at 11/11/16 1401 Last data filed at 11/11/16 1314  Gross per 24 hour  Intake                3 ml  Output             1550 ml  Net            -1547 ml     Physical Exam: General: Morbidly obese female, laying in the bed   HEENT Anicteric, moist oral mucous membranes   Neck supple  Pulm/lungs Normal breathing effort, clear to auscultation   CVS/Heart Regular, no rub or gallop   Abdomen:  Soft, nontender, obese   Extremities: + Edema   Neurologic: Alert, oriented   Skin: Abdominal wall edema           Basic Metabolic Panel:   Recent Labs Lab 11/07/16 1949 11/08/16 0624 11/09/16 0557 11/10/16 0419 11/11/16 0521  NA 140 142 140 139 136  K 3.9 3.4* 3.8 3.9 3.9  CL 95* 97* 97* 96* 94*  CO2 37* 36* 36* 36* 36*  GLUCOSE 195* 128* 126* 97 188*  BUN 20 20 24* 30* 36*  CREATININE 1.53* 1.66* 2.06* 2.14* 2.32*  CALCIUM 8.6* 8.3* 8.3* 8.1* 7.8*     CBC:  Recent Labs Lab 11/07/16 1949 11/08/16 0624  WBC 5.7 5.7  HGB 11.3* 9.9*  HCT 34.3* 30.1*  MCV 86.7 87.4  PLT 291 286     No results found for: HEPBSAG, HEPBSAB,  HEPBIGM    Microbiology:  Recent Results (from the past 240 hour(s))  MRSA PCR Screening     Status: Abnormal   Collection Time: 11/08/16  4:57 AM  Result Value Ref Range Status   MRSA by PCR POSITIVE (A) NEGATIVE Final    Comment:        The GeneXpert MRSA Assay (FDA approved for NASAL specimens only), is one component of a comprehensive MRSA colonization surveillance program. It is not intended to diagnose MRSA infection nor to guide or monitor treatment for MRSA infections. RESULT CALLED TO, READ BACK BY AND VERIFIED WITH:  RENEE BABB AT 0627 11/08/16 SDR     Coagulation Studies: No results for input(s): LABPROT, INR in the last 72 hours.  Urinalysis:  Recent Labs  11/10/16 1309  COLORURINE YELLOW*  LABSPEC 1.011  PHURINE 5.0  GLUCOSEU NEGATIVE  HGBUR NEGATIVE  BILIRUBINUR NEGATIVE  KETONESUR NEGATIVE  PROTEINUR >=300*  NITRITE NEGATIVE  LEUKOCYTESUR NEGATIVE      Imaging: Dg Chest 1 View  Result Date: 11/11/2016 CLINICAL DATA:  Acute exacerbation of congestive heart failure. EXAM:  CHEST 1 VIEW COMPARISON:  Radiographs of November 07, 2016. FINDINGS: Stable cardiomegaly and central pulmonary vascular congestion is noted. No pneumothorax is noted. Stable elevated right hemidiaphragm is noted. Bony thorax is unremarkable. Probable small right pleural effusion is noted. IMPRESSION: Stable cardiomegaly and central pulmonary vascular congestion. Probable small right pleural effusion. Electronically Signed   By: Marijo Conception, M.D.   On: 11/11/2016 11:50     Medications:   . sodium chloride    . nitroGLYCERIN Stopped (11/08/16 1030)   . albuterol  2.5 mg Nebulization BID  . amLODipine  10 mg Oral Daily  . aspirin EC  81 mg Oral Daily  . budesonide  0.25 mg Nebulization BID  . Chlorhexidine Gluconate Cloth  6 each Topical Q0600  . citalopram  40 mg Oral Daily  . enoxaparin (LOVENOX) injection  40 mg Subcutaneous BID  . furosemide  80 mg Intravenous BID   . hydrALAZINE  100 mg Oral Q8H  . [START ON 11/12/2016] Influenza vac split quadrivalent PF  0.5 mL Intramuscular Tomorrow-1000  . insulin aspart  0-5 Units Subcutaneous QHS  . insulin aspart  0-9 Units Subcutaneous TID WC  . insulin glargine  20 Units Subcutaneous QHS  . metoprolol tartrate  75 mg Oral BID  . mupirocin ointment  1 application Nasal BID  . potassium chloride  20 mEq Oral BID  . simvastatin  40 mg Oral QHS  . sodium chloride flush  3 mL Intravenous Q12H   sodium chloride, acetaminophen, diphenhydrAMINE, guaiFENesin-dextromethorphan, hydrALAZINE, HYDROcodone-acetaminophen, labetalol, ondansetron (ZOFRAN) IV, polyethylene glycol, sodium chloride flush  Assessment/ Plan:  46 y.o. African-American female with morbid obesity, insulin-dependent diabetes, chronic kidney disease stage III, concentric left ventricular hypertrophy, COPD, hypertension, proteinuria She presents for accelerated hypertension due to no medications for several days after she lost her insurance coverage  1. Acute kidney injury 2. Chronic kidney disease stage III. Baseline creatinine 1.53/GFR 46 from 11/07/2016 3. Proteinuria, 5 g in May 2018. ANA negative in October 2016, SPEP negative for M spike 06/2016 4. Hypertension and dependent edema 5. Morbid obesity 6. Diabetes type 2 with nephropathy  - renal function has been deteriorating.  This is likely secondary to improve blood pressure control and decrease renal perfusion.  However she is also on an aggressive dosage of Lasix.  Therefore we will go ahead and discontinue furosemide at this time.  Consider restarting this tomorrow.  She will need close monitoring of her renal function as an outpatient as she is at high risk for progression of renal dysfunction.   LOS: White, Travers Goodley 9/17/20182:01 PM  [facility] [place], [place]

## 2016-11-11 NOTE — Progress Notes (Signed)
Patient Name: [name] Date of Encounter: 11/11/2016  Primary Cardiologist: [facility] Problem List     Active Problems:   Acute exacerbation of CHF (congestive heart failure) (HCC)     Subjective   Still SOB, though improving. Cough productive of thick green sputum. No chest pain. Renal function trending up from 2.14-->2.32 today. Baseline SCr ~ 1.9-2.0. She is uncertain of her dry weight (thinks possibly 300-315 pounds). Unable to go by discharge weight from May admission given it appears similar to admission weight of 333 pounds. Net - 2.3 L for the admission and 1.3 L for the past 24 hours. Remains on IV Lasix, received metolazone on 9/16. CXR, VQ scan, and echo pending.   Inpatient Medications    Scheduled Meds: . albuterol  2.5 mg Nebulization BID  . amLODipine  10 mg Oral Daily  . aspirin EC  81 mg Oral Daily  . budesonide  0.25 mg Nebulization BID  . Chlorhexidine Gluconate Cloth  6 each Topical Q0600  . citalopram  40 mg Oral Daily  . enoxaparin (LOVENOX) injection  40 mg Subcutaneous BID  . furosemide  80 mg Intravenous BID  . hydrALAZINE  100 mg Oral Q8H  . [START ON 11/12/2016] Influenza vac split quadrivalent PF  0.5 mL Intramuscular Tomorrow-1000  . insulin aspart  0-5 Units Subcutaneous QHS  . insulin aspart  0-9 Units Subcutaneous TID WC  . insulin glargine  20 Units Subcutaneous QHS  . metoprolol tartrate  75 mg Oral BID  . mupirocin ointment  1 application Nasal BID  . potassium chloride  20 mEq Oral BID  . simvastatin  40 mg Oral QHS  . sodium chloride flush  3 mL Intravenous Q12H   Continuous Infusions: . sodium chloride    . nitroGLYCERIN Stopped (11/08/16 1030)   PRN Meds: sodium chloride, acetaminophen, diphenhydrAMINE, guaiFENesin-dextromethorphan, hydrALAZINE, HYDROcodone-acetaminophen, labetalol, ondansetron (ZOFRAN) IV, polyethylene glycol, sodium chloride flush   Vital Signs    Vitals:   11/11/16 0444 11/11/16 0747 11/11/16  0756 11/11/16 0759  BP: (!) 153/90  (!) 141/79 (!) 141/75  Pulse: 71  87 70  Resp: 18   17  Temp: 98.6 F (37 C)   98.7 F (37.1 C)  TempSrc: Oral   Oral  SpO2: 99% 98% 99% 98%  Weight: (!) 333 lb 12.8 oz (151.4 kg)     Height:        Intake/Output Summary (Last 24 hours) at 11/11/16 1120 Last data filed at 11/11/16 1000  Gross per 24 hour  Intake                3 ml  Output             1350 ml  Net            -1347 ml   Filed Weights   11/09/16 0359 11/10/16 0500 11/11/16 0444  Weight: (!) 329 lb 11.2 oz (149.6 kg) (!) 330 lb 7 oz (149.9 kg) (!) 333 lb 12.8 oz (151.4 kg)    Physical Exam    GEN: Well nourished, well developed, in no acute distress.  HEENT: Grossly normal.  Neck: Supple,JVD difficult to assess 2/2 body habitus, no carotid bruits, or masses. Cardiac: RRR, no murmurs, rubs, or gallops. No clubbing, cyanosis, trace to 1+ bilateral pitting edema.  Radials/DP/PT 2+ and equal bilaterally.  Respiratory:  Diminished breath sounds bilateral bases. GI: Soft, nontender, nondistended, BS + x 4. MS: no deformity or atrophy.  Skin: warm and dry, no rash. Neuro:  Strength and sensation are intact. Psych: AAOx3.  Normal affect.  Labs    CBC No results for input(s): WBC, NEUTROABS, HGB, HCT, MCV, PLT in the last 72 hours. Basic Metabolic Panel  Recent Labs  11/10/16 0419 11/11/16 0521  NA 139 136  K 3.9 3.9  CL 96* 94*  CO2 36* 36*  GLUCOSE 97 188*  BUN 30* 36*  CREATININE 2.14* 2.32*  CALCIUM 8.1* 7.8*   Liver Function Tests No results for input(s): AST, ALT, ALKPHOS, BILITOT, PROT, ALBUMIN in the last 72 hours. No results for input(s): LIPASE, AMYLASE in the last 72 hours. Cardiac Enzymes  Recent Labs  11/08/16 1630  TROPONINI 0.05*   BNP Invalid input(s): POCBNP D-Dimer No results for input(s): DDIMER in the last 72 hours. Hemoglobin A1C  Recent Labs  11/09/16 0557  HGBA1C 7.2*   Fasting Lipid Panel No results for input(s): CHOL, HDL,  LDLCALC, TRIG, CHOLHDL, LDLDIRECT in the last 72 hours. Thyroid Function Tests No results for input(s): TSH, T4TOTAL, T3FREE, THYROIDAB in the last 72 hours.  Invalid input(s): FREET3  Telemetry    Sinus rhythm - Personally Reviewed  ECG    n/a - Personally Reviewed  Radiology    No results found.  Cardiac Studies   TTE 06/2016: Study Conclusions  - Procedure narrative: Transthoracic echocardiography. Image   quality was suboptimal. The study was technically difficult. - Left ventricle: The cavity size was normal. There was moderate   concentric hypertrophy. Systolic function was normal. The   estimated ejection fraction was in the range of 60% to 65%. Wall   motion was normal; there were no regional wall motion   abnormalities. The study is not technically sufficient to allow   evaluation of LV diastolic function. - Left atrium: The atrium was moderately dilated. - Right ventricle: Systolic function was normal. - Pulmonary arteries: Systolic pressure could not be accurately   estimated. - Pericardium, extracardiac: A trivial pericardial effusion was   identified.  TTE pending.  Patient Profile     46 y.o. female with history of HFpEF, CKD III, COPD/asthma on home O2, HTN, DM II, obesity, and iron deficiency/chronic blood loss anemia, who is being seen for the evaluation of acute on chronic diast CHFat the request of Dr. Posey Pronto.   Assessment & Plan    1. Acute on chronic HFpEF: -She does continue to appear volume up -Renal function continues to bump with IV diuresis -She is for CXR, VQ scan, and echo today -Await these studies -Consider RHC on 9/18 to evaluate for persistently elevated right heart/pulmonary pressure despite inpatient IV diuresis  -Continue IV Lasix -Strict Is and Os -CHF education, she reports drinking at least 2 L fluids daily  2. Elevated troponin: -Mildly elevated likely in the setting of volume overload and CKD -Echo pending as  above -Would ideally like to avoid LHC given CKD unless indicated by echo -Consider outpatient Myoview to evaluate for high risk ischemia   3. Acute on CKD stage III: -Monitor with diuresis   4. HTN: -Improved from admission  Signed, Marcille Blanco [place] Pager: [phone] 11/11/2016, 11:20 AM

## 2016-11-11 NOTE — Care Management (Signed)
Admitted from home with congestive heart failure.  Has access to scales.  Provided patient with Living with Heart Failure Education Booklet. Placed dietician consults.  Patient is followed by Heart Failure Clinic.  discussed completion of application for [facility] and provided patient with the complete application for both clinics.  CM will follow closely for medication assistance at discharge.  Is followed by [facility] pcp.  Hs been seen by financial counselor [name] to initiate medicaid application

## 2016-11-11 NOTE — Progress Notes (Signed)
[place] [facility] [place] NAME: [name]    MR#:  [id number]  DATE OF BIRTH:  09/29/1970  SUBJECTIVE:  continues to  have shortness of breath and lower extremity edema. She is reporting some pleuritic chest pain this morning  REVIEW OF SYSTEMS:    Review of Systems  Constitutional: Negative for fever, chills weight loss HENT: Negative for ear pain, nosebleeds, congestion, facial swelling, rhinorrhea, neck pain, neck stiffness and ear discharge.   Respiratory: Negative for cough, Positive shortness of breath, no wheezing  Cardiovascular:++ chest pain, NO palpitations and Positive leg swelling.  Gastrointestinal: Negative for heartburn, abdominal pain, vomiting, diarrhea or consitpation Positive fluid in abdomen Genitourinary: Negative for dysuria, urgency, frequency, hematuria Musculoskeletal: Negative for back pain or joint pain Neurological: Negative for dizziness, seizures, syncope, focal weakness,  numbness and headaches.  Hematological: Does not bruise/bleed easily.  Psychiatric/Behavioral: Negative for hallucinations, confusion, dysphoric mood    Tolerating Diet: yes      DRUG ALLERGIES:   Allergies  Allergen Reactions  . Dust Mite Extract Shortness Of Breath and Itching  . Mold Extract [Trichophyton] Shortness Of Breath and Itching  . Feraheme [Ferumoxytol] Itching  . Pollen Extract   . Sulfa Antibiotics Itching    VITALS:  Blood pressure (!) 141/75, pulse 70, temperature 98.7 F (37.1 C), temperature source Oral, resp. rate 17, height 5\' 4"  (1.626 m), weight (!) 151.4 kg (333 lb 12.8 oz), last menstrual period 09/06/2016, SpO2 98 %.  PHYSICAL EXAMINATION:  Constitutional: Appears well-developed and well-nourished. No distress. HENT: Normocephalic. [name] Oropharynx is clear and moist.  Eyes: Conjunctivae and EOM are normal. PERRLA, no scleral icterus.  Neck: Normal ROM. Neck supple. No JVD. No tracheal deviation. CVS: RRR,  S1/S2 +, no murmurs, no gallops, no carotid bruit.  Pulmonary: Effort and Diminished breath sounds throughout, no stridor, rhonchi, wheezes, rales.  Abdominal: Soft. BS +,  Positive abdominal distension/swelling, no tenderness, rebound or guarding.  Musculoskeletal: Normal range of motion.2+ LEE  and no tenderness.  Neuro: Alert. CN 2-12 grossly intact. No focal deficits. Skin: Skin is warm and dry. No rash noted. Psychiatric: Normal mood and affect.      LABORATORY PANEL:   CBC  Recent Labs Lab 11/08/16 0624  WBC 5.7  HGB 9.9*  HCT 30.1*  PLT 286   ------------------------------------------------------------------------------------------------------------------  Chemistries   Recent Labs Lab 11/11/16 0521  NA 136  K 3.9  CL 94*  CO2 36*  GLUCOSE 188*  BUN 36*  CREATININE 2.32*  CALCIUM 7.8*   ------------------------------------------------------------------------------------------------------------------  Cardiac Enzymes  Recent Labs Lab 11/08/16 0624 11/08/16 1043 11/08/16 1630  TROPONINI 0.06* 0.06* 0.05*   ------------------------------------------------------------------------------------------------------------------  RADIOLOGY:  No results found.   ASSESSMENT AND PLAN:   46 year old morbidly obese female who presented with shortness of breath.  1. Acute on chronic hypoxic respiratory failure in the setting of acute on chronic diastolic heart failure due to poor adherence to daily diuretic therapy Cardiology consultation is appreciated Continue 80 mg IV twice a day of Lasix, received 1 dose of Metolazone yesterday but creatinine still increasing. Cardiac catheterization is being considered to evaluate right heart and left heart pressures given increased creatinine with diuresis. Follow up on today's echocardiogram Monitor intake and output with daily weight She will need CHF clinic at discharge.   2. Chest pain with Elevated troponin on  admissiondue to demand ischemia. Patient has been ruled out for ACS. I will order VQ scan today due to pleuritic  nature of chest pain that she reports this morning.   3. Hypertension: [name] blood pressure better controlled: Continue Norvasc, metoprolol and hydralazine Blood pressure is acceptable   4. Hyperlipidemia: Continue statin  5. Diabetes: A1c 7.2 With proteinuria  Continue Lantus with sliding scale and ADA diet  6. Morbid obesity: Patient will need outpatient sleep evaluation  7. Iron deficiency anemia: Continue to monitor hemoglobin  8. COPD with chronic respiratory failure: No signs of exacerbation 9. Acute on chronic kidney disease stage III: Creatinine increasing in the setting of IV Lasix and diuresis. Renal consult appreciated Acute kidney injury is felt to be due to accelerated hypertension at the time of presentation as per nephrology.  Physical therapy is recommended skilled nursing facility at discharge.    Management plans discussed with the patient and she is in agreement.  CODE STATUS: full  TOTAL TIME TAKING CARE OF THIS PATIENT: 24 minutes.     POSSIBLE D/C 2-4  days, DEPENDING ON CLINICAL CONDITION.   Shrihaan Porzio M.D on 11/11/2016 at 10:55 AM  Between 7am to 6pm - Pager - [phone] After 6pm go to [email or web address] - password [email or web address] [facility]  Office  [phone]  CC: Primary care physician; [name], NP  Note: This dictation was prepared with Dragon dictation along with smaller phrase technology. Any transcriptional errors that result from this process are unintentional.

## 2016-11-11 NOTE — Progress Notes (Signed)
Patient did well with svn treatments today. Continues o2 at 3liters. Continues to refuse bipap at night. No distress noted however

## 2016-11-12 ENCOUNTER — Encounter: Payer: Self-pay | Admitting: *Deleted

## 2016-11-12 ENCOUNTER — Inpatient Hospital Stay
Admit: 2016-11-12 | Discharge: 2016-11-12 | Disposition: A | Discharge status: Home / Self Care | Payer: Medicaid Other | Attending: Family Medicine | Admitting: Family Medicine

## 2016-11-12 DIAGNOSIS — N189 Chronic kidney disease, unspecified: Secondary | ICD-10-CM

## 2016-11-12 DIAGNOSIS — N179 Acute kidney failure, unspecified: Secondary | ICD-10-CM

## 2016-11-12 DIAGNOSIS — I10 Essential (primary) hypertension: Secondary | ICD-10-CM

## 2016-11-12 LAB — URINE CULTURE

## 2016-11-12 LAB — BASIC METABOLIC PANEL
Anion gap: 7 (ref 5–15)
BUN: 44 mg/dL — ABNORMAL HIGH (ref 6–20)
CHLORIDE: 94 mmol/L — AB (ref 101–111)
CO2: 35 mmol/L — AB (ref 22–32)
CREATININE: 2.25 mg/dL — AB (ref 0.44–1.00)
Calcium: 8 mg/dL — ABNORMAL LOW (ref 8.9–10.3)
GFR calc non Af Amer: 25 mL/min — ABNORMAL LOW (ref >60)
GFR, EST AFRICAN AMERICAN: 29 mL/min — AB (ref >60)
Glucose, Bld: 216 mg/dL — ABNORMAL HIGH (ref 65–99)
POTASSIUM: 4.1 mmol/L (ref 3.5–5.1)
Sodium: 136 mmol/L (ref 135–145)

## 2016-11-12 LAB — GLUCOSE, CAPILLARY
GLUCOSE-CAPILLARY: 193 mg/dL — AB (ref 65–99)
GLUCOSE-CAPILLARY: 205 mg/dL — AB (ref 65–99)
GLUCOSE-CAPILLARY: 171 mg/dL — AB (ref 65–99)
Glucose-Capillary: 222 mg/dL — ABNORMAL HIGH (ref 65–99)

## 2016-11-12 LAB — CBC
HEMATOCRIT: 31.1 % — AB (ref 35.0–47.0)
HEMOGLOBIN: 10.1 g/dL — AB (ref 12.0–16.0)
MCH: 27.6 pg (ref 26.0–34.0)
MCHC: 32.4 g/dL (ref 32.0–36.0)
MCV: 85.3 fL (ref 80.0–100.0)
Platelets: 288 10*3/uL (ref 150–440)
RBC: 3.64 MIL/uL — ABNORMAL LOW (ref 3.80–5.20)
RDW: 14.8 % — AB (ref 11.5–14.5)
WBC: 4.9 10*3/uL (ref 3.6–11.0)

## 2016-11-12 MED ORDER — INSULIN GLARGINE 100 UNIT/ML ~~LOC~~ SOLN
23.0000 [IU] | Freq: Every day | SUBCUTANEOUS | Status: DC
  Administered 2016-11-12 – 2016-11-15 (×4): 23 [IU] via SUBCUTANEOUS
  Filled 2016-11-12 (×5): qty 0.23

## 2016-11-12 MED ORDER — METOPROLOL TARTRATE 50 MG PO TABS
100.0000 mg | ORAL_TABLET | Freq: Two times a day (BID) | ORAL | Status: DC
  Administered 2016-11-12 – 2016-11-16 (×8): 100 mg via ORAL
  Filled 2016-11-12 (×8): qty 2

## 2016-11-12 MED ORDER — INSULIN ASPART 100 UNIT/ML ~~LOC~~ SOLN
3.0000 [IU] | Freq: Three times a day (TID) | SUBCUTANEOUS | Status: DC
  Administered 2016-11-12 – 2016-11-16 (×12): 3 [IU] via SUBCUTANEOUS
  Filled 2016-11-12 (×12): qty 1

## 2016-11-12 NOTE — Plan of Care (Signed)
Problem: Food- and Nutrition-Related Knowledge Deficit (NB-1.1) Goal: Nutrition education Formal process to instruct or train a patient/client in a skill or to impart knowledge to help patients/clients voluntarily manage or modify food choices and eating behavior to maintain or improve health.  Outcome: Adequate for Discharge Nutrition Education Note  RD consulted for nutrition education regarding acute CHF exacerbation.  RD provided "Low Sodium Nutrition Therapy" handout from the Academy of [employer]etics. Reviewed patient's dietary recall. Provided examples on ways to decrease sodium intake in diet. Discouraged intake of processed foods and use of salt shaker. Encouraged fresh fruits and vegetables as well as whole grain sources of carbohydrates to maximize fiber intake.   Patient does not do most of cooking, her husband does. Husband reports using sea salt, sprinkling it on during cooking. They do not add salt at the table. Patient complains of food her lacking salt and flavor. Discussed with husband the importance of 2g sodium diet and utilizing a teaspoon of salt per day. Patient normally drinks water or kool-aid, emphasized the role of fluids, foods to avoid, and importance of weighing self daily. Teach back method used.  Expect fair compliance.  Body mass index is 57.04 kg/m. Pt meets criteria for obese class III based on current BMI.  Current diet order is carb modified, patient is consuming approximately 100% of meals at this time. Labs and medications reviewed. No further nutrition interventions warranted at this time. RD contact information provided. If additional nutrition issues arise, please re-consult RD.   Satira Anis. Vashawn Ekstein, MS, RD LDN Inpatient Clinical Dietitian Pager [phone]

## 2016-11-12 NOTE — Progress Notes (Signed)
Rounded on patient.  Patient admitted with dx of Acute CHF - Acute on Chronic Diastolic CHF.  Last EF was 60 to 65% (06/27/2016).  Echo pending.  Patient also has DM, CKDIII, COPD, oxygen dependent, Asthma, Obesity, hx of Anemia, HTN. Patient is on Contact Precautions.  Patient is followed in the [facility].  Dr. Rockey Situ is patient's primary cardiologist.  Patient has an appointment in the [facility] on 11/15/2016 at 11:40 a.m.  Patient was admitted on 11/07/2016 and reportedly ran out of her medications.  Patient is on daily weights, strict Intake and Output, and has had a Teaching laboratory technician.  Patient sitting up in chair in room while CNA changes linen.  Booklet, "Living Better with Heart Failure" reviewed with patient.  Last EF measurement discussed with patient.  Patient verbalized understanding that Echo has been ordered but has not been done.  Diagnosis of HF and meaning of EF measurement reviewed with patient.  Upon asking patient if she performs daily weights patient stated she used to weigh daily.  Note:  Patient confirmed she  Has scales to weigh herself.  Patient then stated she plans to start weighing herself every day.  Instructed patient to weigh herself daily and assess her symptoms according to the HF Zones; then act accordingly.  Reviewed each HF Zone with patient.  Patient verbalized understanding on what to do if her HF symptoms and weight fall in the Yellow Zone and / or Red Zone.  Reiterated the purpose of the HF Clinic and encourage patient to contact Dr. Donivan Scull office or [name] in [facility] if her symptoms / weight are in the Yellow Zone.  Reminded patient that HF is a chronic illness which must be self assessed / self managed along with help from cardiologists and HF Clinic.  Dietitian saw patient this morning to educate about diet.  Discussed taking HF medications as prescribed.  Patient described a little pill that [name], FNP has prescribed for her to take with her torsemide  when her weight is up.  Activity/Exercise discussed.  When I mentioned [facility], patient stated she did not mean to cut me off, but she wanted to let me know she had tried to participate in [facility] previously, but she just could not do it.  She stated she felt really bad in that patients older than she was could do far more than she could.  She also stated she hurts all over and it even hurts for her to have someone touch her.  She suggested it might be better if she had physical therapy or someone to work with her one on one.  Will pass on to provider and CM.    Will follow-up with patient tomorrow.    Roanna Epley, RN, BSN, [facility] Cardiovascular and Pulmonary Nurse Navigator

## 2016-11-12 NOTE — Progress Notes (Signed)
Inpatient Diabetes Program Recommendations  AACE/ADA: New Consensus Statement on Inpatient Glycemic Control (2015)  Target Ranges:  Prepandial:   less than 140 mg/dL      Peak postprandial:   less than 180 mg/dL (1-2 hours)      Critically ill patients:  140 - 180 mg/dL   Lab Results  Component Value Date   GLUCAP 193 (H) 11/12/2016   HGBA1C 7.2 (H) 11/09/2016    Review of Glycemic Control  Results for [name], [name] (MRN [id number]) as of 11/12/2016 09:02  Ref. Range 11/11/2016 07:22 11/11/2016 11:02 11/11/2016 16:47 11/11/2016 20:48 11/12/2016 07:36  Glucose-Capillary Latest Ref Range: 65 - 99 mg/dL 155 (H) 147 (H) 259 (H) 248 (H) 193 (H)    Diabetes history: Type 2 Outpatient Diabetes medications: Novolog 0-12 units tid, Tresiba 15 units qday Current orders for Inpatient glycemic control: Lantus 20 units qhs, Novolog 0-9 units tid, Novolog 0-5 units qhs  Inpatient Diabetes Program Recommendations: Fasting blood sugar elevated- consider increasing Lantus to 23 units qhs (0.15mg /dl) qhs.  Consider adding Novolog 3 units tid with meals. (hold if patient eats less than 50%)- continue Novolog correction as ordered.  Gentry Fitz, RN, BA, MHA, CDE Diabetes Coordinator Inpatient Diabetes Program  [phone] (Team Pager) [phone] ([place]) 11/12/2016 9:09 AM

## 2016-11-12 NOTE — Progress Notes (Signed)
[facility], [place] 11/12/16  Subjective:  Creatinine slightly lower today at 2.2 however BUN higher at 44. Patient states that she feels slightly better today. Case discussed with cardiology. We would like to hold diuretics for 1 additional day. Urine output was 1.9 L over the preceding 24 hours.   Objective:  Vital signs in last 24 hours:  Temp:  [98.1 F (36.7 C)-98.9 F (37.2 C)] 98.4 F (36.9 C) (09/18 0833) Pulse Rate:  [71-78] 72 (09/18 0833) Resp:  [17-18] 18 (09/18 0833) BP: (129-154)/(73-83) 153/74 (09/18 0833) SpO2:  [93 %-98 %] 98 % (09/18 0833) Weight:  [150.7 kg (332 lb 4.8 oz)] 150.7 kg (332 lb 4.8 oz) (09/18 0326)  Weight change: -0.68 kg (-1 lb 8 oz) Filed Weights   11/10/16 0500 11/11/16 0444 11/12/16 0326  Weight: (!) 149.9 kg (330 lb 7 oz) (!) 151.4 kg (333 lb 12.8 oz) (!) 150.7 kg (332 lb 4.8 oz)    Intake/Output:    Intake/Output Summary (Last 24 hours) at 11/12/16 1045 Last data filed at 11/12/16 0500  Gross per 24 hour  Intake                3 ml  Output             1900 ml  Net            -1897 ml     Physical Exam: General: Morbidly obese female, laying in the bed   HEENT Anicteric, moist oral mucous membranes   Neck No JVD   Pulm/lungs Normal breathing effort, clear to auscultation   CVS/Heart Regular, no rub or gallop   Abdomen:  Soft, nontender, obese, dependent edema over pannus   Extremities: + Edema   Neurologic: Alert, oriented, conversant  Skin: Abdominal wall edema           Basic Metabolic Panel:   Recent Labs Lab 11/08/16 0624 11/09/16 0557 11/10/16 0419 11/11/16 0521 11/12/16 0405  NA 142 140 139 136 136  K 3.4* 3.8 3.9 3.9 4.1  CL 97* 97* 96* 94* 94*  CO2 36* 36* 36* 36* 35*  GLUCOSE 128* 126* 97 188* 216*  BUN 20 24* 30* 36* 44*  CREATININE 1.66* 2.06* 2.14* 2.32* 2.25*  CALCIUM 8.3* 8.3* 8.1* 7.8* 8.0*     CBC:  Recent Labs Lab 11/07/16 1949 11/08/16 0624  11/12/16 1004  WBC 5.7 5.7 4.9  HGB 11.3* 9.9* 10.1*  HCT 34.3* 30.1* 31.1*  MCV 86.7 87.4 85.3  PLT 291 286 288     No results found for: HEPBSAG, HEPBSAB, HEPBIGM    Microbiology:  Recent Results (from the past 240 hour(s))  MRSA PCR Screening     Status: Abnormal   Collection Time: 11/08/16  4:57 AM  Result Value Ref Range Status   MRSA by PCR POSITIVE (A) NEGATIVE Final    Comment:        The GeneXpert MRSA Assay (FDA approved for NASAL specimens only), is one component of a comprehensive MRSA colonization surveillance program. It is not intended to diagnose MRSA infection nor to guide or monitor treatment for MRSA infections. RESULT CALLED TO, READ BACK BY AND VERIFIED WITH:  RENEE BABB AT 1443 11/08/16 SDR   Urine Culture     Status: Abnormal   Collection Time: 11/10/16  1:09 PM  Result Value Ref Range Status   Specimen Description URINE, RANDOM  Final   Special Requests NONE  Final   Culture MULTIPLE SPECIES  PRESENT, SUGGEST RECOLLECTION (A)  Final   Report Status 11/12/2016 FINAL  Final    Coagulation Studies: No results for input(s): LABPROT, INR in the last 72 hours.  Urinalysis:  Recent Labs  11/10/16 1309  COLORURINE YELLOW*  LABSPEC 1.011  PHURINE 5.0  GLUCOSEU NEGATIVE  HGBUR NEGATIVE  BILIRUBINUR NEGATIVE  KETONESUR NEGATIVE  PROTEINUR >=300*  NITRITE NEGATIVE  LEUKOCYTESUR NEGATIVE      Imaging: Dg Chest 1 View  Result Date: 11/11/2016 CLINICAL DATA:  Acute exacerbation of congestive heart failure. EXAM: CHEST 1 VIEW COMPARISON:  Radiographs of November 07, 2016. FINDINGS: Stable cardiomegaly and central pulmonary vascular congestion is noted. No pneumothorax is noted. Stable elevated right hemidiaphragm is noted. Bony thorax is unremarkable. Probable small right pleural effusion is noted. IMPRESSION: Stable cardiomegaly and central pulmonary vascular congestion. Probable small right pleural effusion. Electronically Signed   By: Marijo Conception, M.D.   On: 11/11/2016 11:50     Medications:   . sodium chloride     . albuterol  2.5 mg Nebulization BID  . amLODipine  10 mg Oral Daily  . aspirin EC  81 mg Oral Daily  . budesonide  0.25 mg Nebulization BID  . Chlorhexidine Gluconate Cloth  6 each Topical Q0600  . citalopram  40 mg Oral Daily  . enoxaparin (LOVENOX) injection  40 mg Subcutaneous BID  . hydrALAZINE  100 mg Oral Q8H  . Influenza vac split quadrivalent PF  0.5 mL Intramuscular Tomorrow-1000  . insulin aspart  0-5 Units Subcutaneous QHS  . insulin aspart  0-9 Units Subcutaneous TID WC  . insulin aspart  3 Units Subcutaneous TID WC  . insulin glargine  23 Units Subcutaneous QHS  . metoprolol tartrate  75 mg Oral BID  . mupirocin ointment  1 application Nasal BID  . potassium chloride  20 mEq Oral BID  . sodium chloride flush  3 mL Intravenous Q12H   sodium chloride, acetaminophen, diphenhydrAMINE, guaiFENesin-dextromethorphan, hydrALAZINE, HYDROcodone-acetaminophen, labetalol, ondansetron (ZOFRAN) IV, polyethylene glycol, sodium chloride flush  Assessment/ Plan:  46 y.o. African-American female with morbid obesity, insulin-dependent diabetes, chronic kidney disease stage III, concentric left ventricular hypertrophy, COPD, hypertension, proteinuria She presents for accelerated hypertension due to no medications for several days after she lost her insurance coverage  1. Acute kidney injury/acute renal failure 2. Chronic kidney disease stage III. Baseline creatinine 1.53/GFR 46 from 11/07/2016 3. Proteinuria, 5 g in May 2018. ANA negative in October 2016, SPEP negative for M spike 06/2016 4. Hypertension and dependent edema 5. Morbid obesity 6. Diabetes type 2 with CKD stage III  Plan:  Continue suspect that she has lower renal function now given the fact that her blood pressure is improved in her renal perfusion pressure may be less than before. She is in need of diuresis however we are awaiting 2-D  echocardiogram result. Based upon this we will consider reinitiating diuretic therapy.  Otherwise continue supportive care and monitor renal function daily. Patient to be maintained on hydralazine and metoprolol for blood pressure control. Eventually she will need to be back on an ACE inhibitor or ARB for renal protection and proteinuria reduction.   LOS: 5 [name] 9/18/201810:45 AM  [address] [place], [place]

## 2016-11-12 NOTE — Progress Notes (Signed)
PT Cancellation Note  Patient Details Name: [name] MRN: [id number] DOB: Jan 18, 1971   Cancelled Treatment:    Reason Eval/Treat Not Completed: Patient declined, no reason specified. Treatment attempted this afternoon; pt refuses noting her back is hurting too much. Pt wishes to defer until tomorrow. Re attempt at a later date as the schedule allows.    Larae Grooms, PTA 11/12/2016, 2:49 PM

## 2016-11-12 NOTE — Progress Notes (Addendum)
Patient Name: [name] Date of Encounter: 11/12/2016  Primary Cardiologist: [facility] Problem List     Active Problems:   Acute exacerbation of CHF (congestive heart failure) (HCC)     Subjective   TTE pending. VQ scan cancelled it appears. CXR with stable CM with pulmonary vascular congestion with probable small right pleural effusion. Documented UOP 4.2 L for the admission and 1.9 L for the past 24 hours. Weight down 1 pound from 333-->332 pounds. Renal function improving this morning from 2.32-->2.25. BUN increasing from 36-->44. Potassium at goal. Lasix last given on the morning of 9/17. No diuretic ordered for this morning.   SOB improving. Sore all over.    Inpatient Medications    Scheduled Meds: . albuterol  2.5 mg Nebulization BID  . amLODipine  10 mg Oral Daily  . aspirin EC  81 mg Oral Daily  . budesonide  0.25 mg Nebulization BID  . Chlorhexidine Gluconate Cloth  6 each Topical Q0600  . citalopram  40 mg Oral Daily  . enoxaparin (LOVENOX) injection  40 mg Subcutaneous BID  . hydrALAZINE  100 mg Oral Q8H  . Influenza vac split quadrivalent PF  0.5 mL Intramuscular Tomorrow-1000  . insulin aspart  0-5 Units Subcutaneous QHS  . insulin aspart  0-9 Units Subcutaneous TID WC  . insulin glargine  20 Units Subcutaneous QHS  . metoprolol tartrate  75 mg Oral BID  . mupirocin ointment  1 application Nasal BID  . potassium chloride  20 mEq Oral BID  . sodium chloride flush  3 mL Intravenous Q12H   Continuous Infusions: . sodium chloride     PRN Meds: sodium chloride, acetaminophen, diphenhydrAMINE, guaiFENesin-dextromethorphan, hydrALAZINE, HYDROcodone-acetaminophen, labetalol, ondansetron (ZOFRAN) IV, polyethylene glycol, sodium chloride flush   Vital Signs    Vitals:   11/12/16 0326 11/12/16 0806 11/12/16 0832 11/12/16 0833  BP: 129/73  (!) 154/74 (!) 153/74  Pulse: 71  78 72  Resp: 18   18  Temp: 98.1 F (36.7 C)   98.4 F (36.9 C)    TempSrc: Oral     SpO2: 93% 98%  98%  Weight: (!) 332 lb 4.8 oz (150.7 kg)     Height:        Intake/Output Summary (Last 24 hours) at 11/12/16 0852 Last data filed at 11/12/16 0500  Gross per 24 hour  Intake                6 ml  Output             1900 ml  Net            -1894 ml   Filed Weights   11/10/16 0500 11/11/16 0444 11/12/16 0326  Weight: (!) 330 lb 7 oz (149.9 kg) (!) 333 lb 12.8 oz (151.4 kg) (!) 332 lb 4.8 oz (150.7 kg)    Physical Exam    GEN: Well nourished, well developed, in no acute distress.  HEENT: Grossly normal.  Neck: Supple,JVD difficult to assess 2/2 body habitus, no carotid bruits, or masses. Cardiac: RRR, no murmurs, rubs, or gallops. No clubbing, cyanosis, trace to 1+ bilateral pitting edema.  Radials/DP/PT 2+ and equal bilaterally.  Respiratory:  Diminished breath sounds bilateral bases, though slightly improved from prior. GI: Soft, nontender, nondistended, BS + x 4. MS: no deformity or atrophy. Skin: warm and dry, no rash. Neuro:  Strength and sensation are intact. Psych: AAOx3.  Normal affect.  Labs    CBC  No results for input(s): WBC, NEUTROABS, HGB, HCT, MCV, PLT in the last 72 hours. Basic Metabolic Panel  Recent Labs  11/11/16 0521 11/12/16 0405  NA 136 136  K 3.9 4.1  CL 94* 94*  CO2 36* 35*  GLUCOSE 188* 216*  BUN 36* 44*  CREATININE 2.32* 2.25*  CALCIUM 7.8* 8.0*   Liver Function Tests No results for input(s): AST, ALT, ALKPHOS, BILITOT, PROT, ALBUMIN in the last 72 hours. No results for input(s): LIPASE, AMYLASE in the last 72 hours. Cardiac Enzymes No results for input(s): CKTOTAL, CKMB, CKMBINDEX, TROPONINI in the last 72 hours. BNP Invalid input(s): POCBNP D-Dimer No results for input(s): DDIMER in the last 72 hours. Hemoglobin A1C No results for input(s): HGBA1C in the last 72 hours. Fasting Lipid Panel No results for input(s): CHOL, HDL, LDLCALC, TRIG, CHOLHDL, LDLDIRECT in the last 72 hours. Thyroid  Function Tests No results for input(s): TSH, T4TOTAL, T3FREE, THYROIDAB in the last 72 hours.  Invalid input(s): FREET3  Telemetry    Sinus rhythm - Personally Reviewed  ECG    n/a - Personally Reviewed  Radiology    Dg Chest 1 View  Result Date: 11/11/2016 CLINICAL DATA:  Acute exacerbation of congestive heart failure. EXAM: CHEST 1 VIEW COMPARISON:  Radiographs of November 07, 2016. FINDINGS: Stable cardiomegaly and central pulmonary vascular congestion is noted. No pneumothorax is noted. Stable elevated right hemidiaphragm is noted. Bony thorax is unremarkable. Probable small right pleural effusion is noted. IMPRESSION: Stable cardiomegaly and central pulmonary vascular congestion. Probable small right pleural effusion. Electronically Signed   By: Marijo Conception, M.D.   On: 11/11/2016 11:50    Cardiac Studies   TTE 06/2016: Study Conclusions  - Procedure narrative: Transthoracic echocardiography. Image   quality was suboptimal. The study was technically difficult. - Left ventricle: The cavity size was normal. There was moderate   concentric hypertrophy. Systolic function was normal. The   estimated ejection fraction was in the range of 60% to 65%. Wall   motion was normal; there were no regional wall motion   abnormalities. The study is not technically sufficient to allow   evaluation of LV diastolic function. - Left atrium: The atrium was moderately dilated. - Right ventricle: Systolic function was normal. - Pulmonary arteries: Systolic pressure could not be accurately   estimated. - Pericardium, extracardiac: A trivial pericardial effusion was   identified.  TTE pending.  Patient Profile     46 y.o. female with history of HFpEF, CKD III, COPD/asthma on home O2, HTN, DM II, obesity, and iron deficiency/chronic blood loss anemia, who is being seen for the evaluation of acute on chronic diast CHFat the request of Dr. Posey Pronto.   Assessment & Plan    1. Acute on  chronic HFpEF: -She does continue to appear volume up, though improved -Renal function improving with holding of Lasix, though still well above baseline -CXR showed continued vascular congestion with small right pleural effusion -Await TTE -Patient unable to lay flat for VQ scan -Consider RHC to evaluate for persistently elevated right heart/pulmonary pressure despite inpatient IV diuresis  -Discussed with renal -Strict Is and Os -CHF education, she reports drinking at least 2 L fluids daily  2. Elevated troponin: -Mildly elevated likely in the setting of volume overload and CKD -Echo pending as above -Would ideally like to avoid LHC given CKD unless indicated by echo -Consider outpatient Myoview to evaluate for high risk ischemia   3. Acute on CKD stage III: -Improving  -Monitor  with diuresis   4. HTN: -Improved from admission  Signed, Marcille Blanco [place] Pager: [phone] 11/12/2016, 8:52 AM   I have seen and examined the patient and agree with the findings and plan, as documented in the PA/NP's note, with the following additions/changes.  Ms. Spindle reports feeling about the same today. She continues to have exertional dyspnea as well as edema. She also notes some abdominal distention.  Physical exam is notable for a morbidly obese woman seated in bed. Heart sounds are distant but regular without murmurs. Breath sounds are diminished at the lung bases. Abdomen is firm and mildly distended. There is plus pitting edema in both legs. JVP is difficult to assess but appears to be at least 10 cm.  Labs are notable for creatinine of 2.25, potassium 4.1, hemoglobin 10.1.  In summary, Ms. Conkey is a 46 year old woman admitted with acute on chronic diastolic heart failure and evidence of continued volume overload in the setting of acute on chronic kidney disease. Diuresis is currently being held at the request of nephrology, given acute kidney injury. I would advocate  for reinitiation of diuretic therapy, as Ms. Bevill is likely still retaining many liters of fluid. Her blood pressure is upper normal to mildly elevated as well. I will defer issue no medication changes at this time; hopefully diuresis will also improve her blood pressure. Defer ischemia evaluation at this time, as I suspect mild troponin elevation reflects apply-demand mismatch.  Nelva Bush, MD [facility] Pager: [phone]

## 2016-11-12 NOTE — Progress Notes (Signed)
[place] [facility] [place] NAME: [name]    MR#:  [id number]  DATE OF BIRTH:  [birth year]  SUBJECTIVE:   Patient could not tolerate lying flat so was and able to get VQ scan. Chest x-ray shows vascular congestion. Patient still with some shortness of breath and lower extremity edema.   REVIEW OF SYSTEMS:    Review of Systems  Constitutional: Negative for fever, chills weight loss HENT: Negative for ear pain, nosebleeds, congestion, facial swelling, rhinorrhea, neck pain, neck stiffness and ear discharge.   Respiratory: Negative for cough, Positive shortness of breath, no wheezing  Cardiovascular:, NO palpitatidenies chest painons and Positive leg swelling.  Gastrointestinal: Negative for heartburn, abdominal pain, vomiting, diarrhea or consitpation Positive fluid in abdomen Genitourinary: Negative for dysuria, urgency, frequency, hematuria Musculoskeletal: Negative for back pain or joint pain She has soreness all over Neurological: Negative for dizziness, seizures, syncope, focal weakness,  numbness and headaches.  Hematological: Does not bruise/bleed easily.  Psychiatric/Behavioral: Negative for hallucinations, confusion, dysphoric mood    Tolerating Diet: yes      DRUG ALLERGIES:   Allergies  Allergen Reactions  . Dust Mite Extract Shortness Of Breath and Itching  . Mold Extract [Trichophyton] Shortness Of Breath and Itching  . Feraheme [Ferumoxytol] Itching  . Pollen Extract   . Sulfa Antibiotics Itching    VITALS:  Blood pressure (!) 153/74, pulse 72, temperature 98.4 F (36.9 C), resp. rate 18, height 5\' 4"  (1.626 m), weight (!) 150.7 kg (332 lb 4.8 oz), last menstrual period 09/06/2016, SpO2 98 %.  PHYSICAL EXAMINATION:  Constitutional: Appears well-developed and well-nourished. No distress. HENT: Normocephalic. [name] Oropharynx is clear and moist.  Eyes: Conjunctivae and EOM are normal. PERRLA, no scleral icterus.   Neck: Normal ROM. Neck supple. No JVD. No tracheal deviation. CVS: RRR, S1/S2 +, no murmurs, no gallops, no carotid bruit.  Pulmonary: Effort and Diminished breath sounds throughout, no stridor, rhonchi, wheezes, rales.  Abdominal: Soft. BS +,  Positive abdominal distension/swelling, no tenderness, rebound or guarding.  Musculoskeletal: Normal range of motion.2+ LEE  and no tenderness.  Neuro: Alert. CN 2-12 grossly intact. No focal deficits. Skin: Skin is warm and dry. No rash noted. Psychiatric: Normal mood and affect.      LABORATORY PANEL:   CBC  Recent Labs Lab 11/12/16 1004  WBC 4.9  HGB 10.1*  HCT 31.1*  PLT 288   ------------------------------------------------------------------------------------------------------------------  Chemistries   Recent Labs Lab 11/12/16 0405  NA 136  K 4.1  CL 94*  CO2 35*  GLUCOSE 216*  BUN 44*  CREATININE 2.25*  CALCIUM 8.0*   ------------------------------------------------------------------------------------------------------------------  Cardiac Enzymes  Recent Labs Lab 11/08/16 0624 11/08/16 1043 11/08/16 1630  TROPONINI 0.06* 0.06* 0.05*   ------------------------------------------------------------------------------------------------------------------  RADIOLOGY:  Dg Chest 1 View  Result Date: 11/11/2016 CLINICAL DATA:  Acute exacerbation of congestive heart failure. EXAM: CHEST 1 VIEW COMPARISON:  Radiographs of November 07, 2016. FINDINGS: Stable cardiomegaly and central pulmonary vascular congestion is noted. No pneumothorax is noted. Stable elevated right hemidiaphragm is noted. Bony thorax is unremarkable. Probable small right pleural effusion is noted. IMPRESSION: Stable cardiomegaly and central pulmonary vascular congestion. Probable small right pleural effusion. Electronically Signed   By: Marijo Conception, M.D.   On: 11/11/2016 11:50     ASSESSMENT AND PLAN:   46 year old morbidly obese female who  presented with shortness of breath.  1. Acute on chronic hypoxic respiratory failure in the setting of acute on chronic  diastolic heart failure due to poor adherence to daily diuretic therapy Cardiology consultation is appreciated Due to increasing creatinine nephrology had recommended to discontinue diuresis for now. Her creatinine is improving.  Cardiac catheterization is being considered to evaluate right heart and left heart pressures given increased creatinine with diuresis. Echocardiogram pending Monitor intake and output with daily weight She will need CHF clinic at discharge.   2. Chest pain with Elevated troponin on admission due to demand ischemia. Patient has been ruled out for ACS. She is unable to tolerate VQ scan I think her chest pain is more due to fluid overload   3. Hypertension:I have discontinue Norvasc as this may also play a role in patient's lower extremity edema. I have increased metoprolol to 100 mg by mouth twice a day.continue hydralazine    4. Hyperlipidemia: I have discontinue statin due to patient's complaint of body aches 5. Diabetes: A1c 7.2 With proteinuria  Continue Lantus with sliding scale and ADA diet  6. Morbid obesity: Patient will need outpatient sleep evaluation  7. Iron deficiency anemia: Continue to monitor hemoglobin  8. COPD with chronic respiratory failure: No signs of exacerbation 9. Acute on chronic kidney disease stage III: creatinine with some improvement this morning. Continue to hold nephrotoxic medications including Lasix as per recommendations by nephrology.   Physical therapy is recommended skilled nursing facility at discharge.    Management plans discussed with the patient and she is in agreement.  CODE STATUS: full  TOTAL TIME TAKING CARE OF THIS PATIENT: 23 minutes.   D/w cardiology  POSSIBLE D/C 2-4  days, DEPENDING ON CLINICAL CONDITION.   Leah Skora M.D on 11/12/2016 at 10:42 AM  Between 7am to 6pm - Pager  - [phone] After 6pm go to [email or web address] - password [email or web address] [facility]  Office  [phone]  CC: Primary care physician; [name], NP  Note: This dictation was prepared with Dragon dictation along with smaller phrase technology. Any transcriptional errors that result from this process are unintentional.

## 2016-11-12 NOTE — Plan of Care (Signed)
Problem: Food- and Nutrition-Related Knowledge Deficit (NB-1.1) Goal: Nutrition education Formal process to instruct or train a patient/client in a skill or to impart knowledge to help patients/clients voluntarily manage or modify food choices and eating behavior to maintain or improve health.  Outcome: Adequate for Discharge Nutrition Education Note  RD consulted for nutrition education regarding new onset CHF.  RD provided "Low Sodium Nutrition Therapy" handout from the Academy of [employer]. Reviewed patient's dietary recall. Provided examples on ways to decrease sodium intake in diet. Discouraged intake of processed foods and use of salt shaker. Encouraged fresh fruits and vegetables as well as whole grain sources of carbohydrates to maximize fiber intake.   RD discussed why it is important for patient to adhere to diet recommendations, and emphasized the role of fluids, foods to avoid, and importance of weighing self daily. Teach back method used.  Expect fair compliance.  Body mass index is 57.04 kg/m. Pt meets criteria for obese class III based on current BMI.  Current diet order is carb modified, patient is consuming approximately 100% of meals at this time. Labs and medications reviewed. No further nutrition interventions warranted at this time. RD contact information provided. If additional nutrition issues arise, please re-consult RD.   [name] Anis. Cheridan Kibler, MS, RD LDN Inpatient Clinical Dietitian Pager [phone]

## 2016-11-13 ENCOUNTER — Inpatient Hospital Stay: Payer: Medicaid Other

## 2016-11-13 DIAGNOSIS — R0603 Acute respiratory distress: Secondary | ICD-10-CM

## 2016-11-13 DIAGNOSIS — R0789 Other chest pain: Secondary | ICD-10-CM

## 2016-11-13 LAB — BASIC METABOLIC PANEL
Anion gap: 8 (ref 5–15)
BUN: 44 mg/dL — AB (ref 6–20)
CHLORIDE: 95 mmol/L — AB (ref 101–111)
CO2: 35 mmol/L — ABNORMAL HIGH (ref 22–32)
CREATININE: 2.09 mg/dL — AB (ref 0.44–1.00)
Calcium: 8 mg/dL — ABNORMAL LOW (ref 8.9–10.3)
GFR calc Af Amer: 32 mL/min — ABNORMAL LOW (ref >60)
GFR calc non Af Amer: 27 mL/min — ABNORMAL LOW (ref >60)
GLUCOSE: 124 mg/dL — AB (ref 65–99)
POTASSIUM: 4.3 mmol/L (ref 3.5–5.1)
SODIUM: 138 mmol/L (ref 135–145)

## 2016-11-13 LAB — GLUCOSE, CAPILLARY
GLUCOSE-CAPILLARY: 96 mg/dL (ref 65–99)
GLUCOSE-CAPILLARY: 206 mg/dL — AB (ref 65–99)
Glucose-Capillary: 113 mg/dL — ABNORMAL HIGH (ref 65–99)
Glucose-Capillary: 186 mg/dL — ABNORMAL HIGH (ref 65–99)

## 2016-11-13 LAB — ECHOCARDIOGRAM COMPLETE
Height: 64 in
WEIGHTICAEL: 5316.8 [oz_av]

## 2016-11-13 IMAGING — DX DG CHEST 1V
1 series · 1 of 1 positions shown · non-contrast
Comparison: Radiograph [DATE].

CLINICAL DATA: Shortness of breath.

EXAM:
CHEST 1 VIEW

[chest ap]
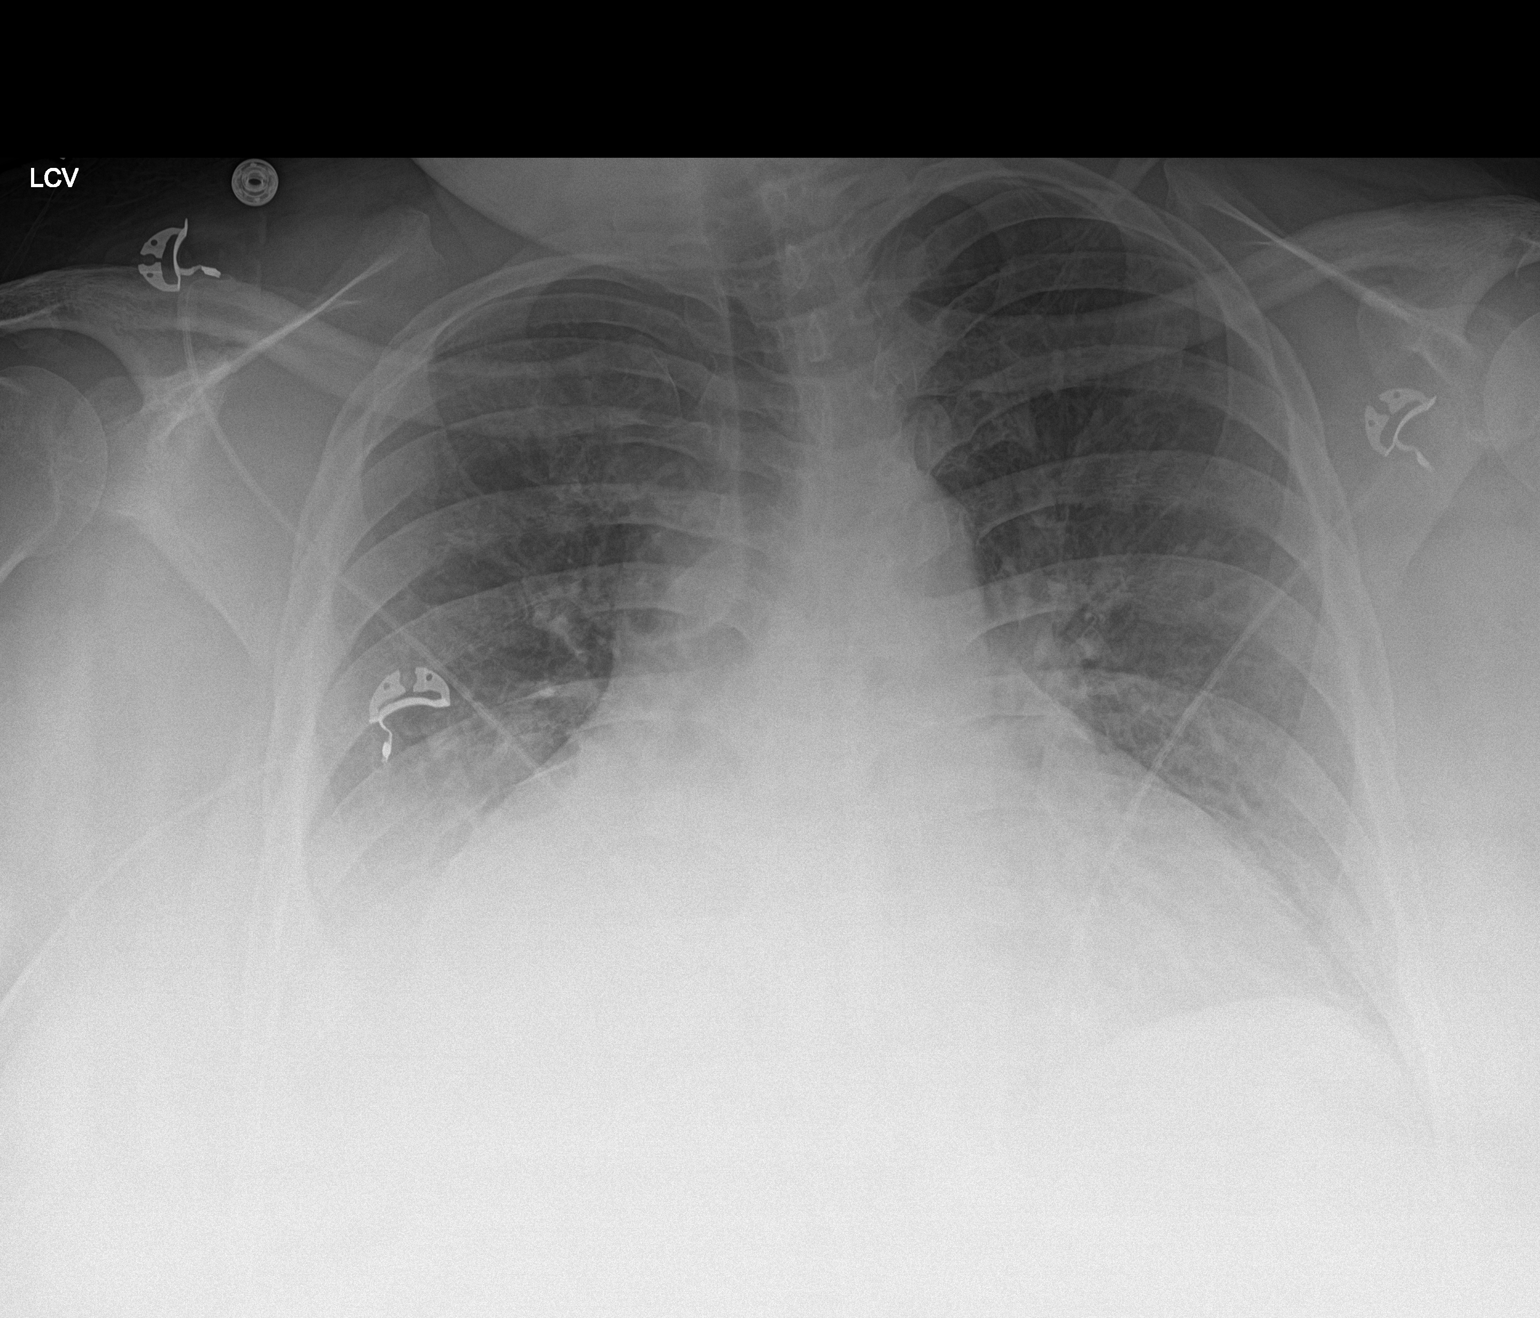

[1 of 1 positions shown; findings below may reference images not displayed]

FINDINGS: Stable cardiomegaly with central pulmonary vascular congestion. No
pneumothorax is noted. Mild right pleural effusion is noted with
probable associated atelectasis or infiltrate. Bony thorax is
unremarkable.
IMPRESSION: Stable cardiomegaly with central pulmonary vascular congestion. Mild
right pleural effusion is noted with probable associated atelectasis
or infiltrate.

## 2016-11-13 MED ORDER — TORSEMIDE 20 MG PO TABS
40.0000 mg | ORAL_TABLET | Freq: Every day | ORAL | Status: DC
  Administered 2016-11-13 – 2016-11-16 (×4): 40 mg via ORAL
  Filled 2016-11-13 (×4): qty 2

## 2016-11-13 NOTE — Progress Notes (Signed)
[place] [facility] [place] NAME: [name]    MR#:  [id number]  DATE OF BIRTH:  12-16-1970  SUBJECTIVE:   Patient Without any new issues this morning. Continues to have some lower extremity edema. She slept better this morning.  REVIEW OF SYSTEMS:    Review of Systems  Constitutional: Negative for fever, chills weight loss HENT: Negative for ear pain, nosebleeds, congestion, facial swelling, rhinorrhea, neck pain, neck stiffness and ear discharge.   Respiratory: Negative for cough, Positive shortness of breath, no wheezing  Cardiovascular:, NO palpitatidenies chest painons and Positive leg swelling.  Gastrointestinal: Negative for heartburn, abdominal pain, vomiting, diarrhea or consitpation Positive fluid in abdomen Genitourinary: Negative for dysuria, urgency, frequency, hematuria Musculoskeletal: Negative for back pain or joint pain She has soreness all over Neurological: Negative for dizziness, seizures, syncope, focal weakness,  numbness and headaches.  Hematological: Does not bruise/bleed easily.  Psychiatric/Behavioral: Negative for hallucinations, confusion, dysphoric mood    Tolerating Diet: yes      DRUG ALLERGIES:   Allergies  Allergen Reactions  . Dust Mite Extract Shortness Of Breath and Itching  . Mold Extract [Trichophyton] Shortness Of Breath and Itching  . Feraheme [Ferumoxytol] Itching  . Pollen Extract   . Sulfa Antibiotics Itching    VITALS:  Blood pressure (!) 161/95, pulse 70, temperature 98.1 F (36.7 C), temperature source Oral, resp. rate 18, height 5\' 4"  (1.626 m), weight (!) 153 kg (337 lb 3.2 oz), last menstrual period 09/06/2016, SpO2 94 %.  PHYSICAL EXAMINATION:  Constitutional: Appears well-developed and well-nourished. No distress. HENT: Normocephalic. [name] Oropharynx is clear and moist.  Eyes: Conjunctivae and EOM are normal. PERRLA, no scleral icterus.  Neck: Normal ROM. Neck supple. No JVD. No  tracheal deviation. CVS: RRR, S1/S2 +, no murmurs, no gallops, no carotid bruit.  Pulmonary: Effort and Diminished breath sounds throughout, no stridor, rhonchi, wheezes, rales.  Abdominal: Soft. BS +,  Positive abdominal distension/swelling, no tenderness, rebound or guarding.  Musculoskeletal: Normal range of motion.2+ LEE  and no tenderness.  Neuro: Alert. CN 2-12 grossly intact. No focal deficits. Skin: Skin is warm and dry. No rash noted. Psychiatric: Normal mood and affect.      LABORATORY PANEL:   CBC  Recent Labs Lab 11/12/16 1004  WBC 4.9  HGB 10.1*  HCT 31.1*  PLT 288   ------------------------------------------------------------------------------------------------------------------  Chemistries   Recent Labs Lab 11/13/16 0353  NA 138  K 4.3  CL 95*  CO2 35*  GLUCOSE 124*  BUN 44*  CREATININE 2.09*  CALCIUM 8.0*   ------------------------------------------------------------------------------------------------------------------  Cardiac Enzymes  Recent Labs Lab 11/08/16 0624 11/08/16 1043 11/08/16 1630  TROPONINI 0.06* 0.06* 0.05*   ------------------------------------------------------------------------------------------------------------------  RADIOLOGY:  Dg Chest 1 View  Result Date: 11/11/2016 CLINICAL DATA:  Acute exacerbation of congestive heart failure. EXAM: CHEST 1 VIEW COMPARISON:  Radiographs of November 07, 2016. FINDINGS: Stable cardiomegaly and central pulmonary vascular congestion is noted. No pneumothorax is noted. Stable elevated right hemidiaphragm is noted. Bony thorax is unremarkable. Probable small right pleural effusion is noted. IMPRESSION: Stable cardiomegaly and central pulmonary vascular congestion. Probable small right pleural effusion. Electronically Signed   By: Marijo Conception, M.D.   On: 11/11/2016 11:50     ASSESSMENT AND PLAN:   46 year old morbidly obese female who presented with shortness of breath.  1.  Acute on chronic hypoxic respiratory failure in the setting of acute on chronic diastolic heart failure due to poor adherence to daily  diuretic therapy Cardiology consultation is appreciated Echocardiogram has been completed. Patient has normal left ventricular ejection fraction with mild to moderate mitral regurgitation and mild diastolic dysfunction and right sided pressures are mild to moderately elevated. Cardiology is recommending starting torsemide 40 mg daily  2. Chest pain with Elevated troponin on admission due to demand ischemia. Patient has been ruled out for ACS.  I think her chest pain is more due to fluid overload   3. Hypertension:I have discontinue Norvasc as this may also play a role in patient's lower extremity edema.  Continue metoprolol and hydralazine    4. Hyperlipidemia: I have discontinue statin due to patient's complaint of body aches 5. Diabetes: A1c 7.2 With proteinuria  Continue Lantus with sliding scale and ADA diet  6. Morbid obesity: Patient will need outpatient sleep evaluation  7. Iron deficiency anemia: Hemoglobin remained stable  8. COPD with chronic respiratory failure: No signs of exacerbation 9. Acute on chronic kidney disease stage III: creatinine with improvement this morning.  Torsemide to be started today. Repeat BMP in a.m.   Physical therapy is recommended skilled nursing facility at discharge.    Management plans discussed with the patient and she is in agreement.  CODE STATUS: full  TOTAL TIME TAKING CARE OF THIS PATIENT: 23 minutes.   D/w cardiology and nephrology   POSSIBLE D/C 2 days, DEPENDING ON CLINICAL CONDITION.   Eliam Snapp M.D on 11/13/2016 at 10:51 AM  Between 7am to 6pm - Pager - [phone] After 6pm go to [email or web address] - password [email or web address] [facility]  Office  [phone]  CC: Primary care physician; [name], NP  Note: This dictation was prepared with Dragon dictation along  with smaller phrase technology. Any transcriptional errors that result from this process are unintentional.

## 2016-11-13 NOTE — Progress Notes (Signed)
Physical Therapy Treatment Patient Details Name: [name] MRN: [id number] DOB: December 13, 1970 Today's Date: 11/13/2016    History of Present Illness Ptis a 46 y.o.femalewith a known history of asthma, COPD,diastolic congestive heart failure, CKD3, diabetes, hypertension, iron deficiency anemia, morbid obesitypresents to the emergency department for evaluation of shortness of breath. Patient was in a usual state of health until 3-4 days when she describes progressively worsening shortness of breath, dyspnea on exertion associated with left side and lateral chest pain, pain with inspiration. Patient states that she has been out of most of her medications for 3-4 days as she has not been able to obtain them secondary to cost and lack of insurance coverage.  Assessment includes: acute on chronic hypoxic respiratory failure in setting of acute on chronic diastolic heart failure, elevated troponin due to demand ischemia, HTN, HLD, DM, COPD, iron deficiency anemia, and COPD.     PT Comments    Spoke with nursing prior to treatment to clear for therapy participation and nursing agrees to PT. Pt agreeable to PT; reports generalized ache "all over" body that is mild currently. Pt participates in seated and supine lower extremity exercises and educated on several upper extremity exercises per pt request. Pt encouraged to participate in exercises several times per day. Pt demonstrates Mod I with bed mobility and transfer to [facility]. Pt wishes to remain upright in bed versus chair. Continue PT to progress strength, endurance to improve functional mobility.    Follow Up Recommendations  SNF     Equipment Recommendations  Rolling walker with 5" wheels;Other (comment)    Recommendations for Other Services       Precautions / Restrictions Precautions Precautions: Fall Restrictions Weight Bearing Restrictions: No    Mobility  Bed Mobility Overal bed mobility: Modified Independent              General bed mobility comments: slow, increased time and effort and use of rails  Transfers Overall transfer level: Modified independent               General transfer comment: transfers to/from [facility] with Mod I. Increased time and effort  Ambulation/Gait                 Stairs            Wheelchair Mobility    Modified Rankin (Stroke Patients Only)       Balance                                            Cognition Arousal/Alertness: Awake/alert Behavior During Therapy: WFL for tasks assessed/performed Overall Cognitive Status: Within Functional Limits for tasks assessed                                        Exercises General Exercises - Lower Extremity Quad Sets: Strengthening;Both;20 reps;Supine Gluteal Sets: Strengthening;Both;20 reps;Supine Long Arc Quad: AROM;Both;10 reps;Seated (2 sets) Heel Slides:  (attempted difficult to perform due to abdomen girth) Hip ABduction/ADduction: Strengthening;Both;10 reps;Seated (2 sets) Hip Flexion/Marching: AROM;Both;20 reps;Seated Toe Raises: AROM;Both;20 reps;Seated Heel Raises: AROM;Both;20 reps;Seated Other Exercises Other Exercises: Pt questions about upper extremity exercises; educated on biceps curls, horizontal abd/add, tricep ext and ovehead shoulder press. Educated to focus on chest symptoms and not peform if symptoms worsen. Pt  states understanding    General Comments        Pertinent Vitals/Pain Pain Assessment:  (mild ache all over)    Home Living                      Prior Function            PT Goals (current goals can now be found in the care plan section) Progress towards PT goals: Progressing toward goals    Frequency    Min 2X/week      PT Plan Current plan remains appropriate    Co-evaluation              AM-PAC PT "6 Clicks" Daily Activity  Outcome Measure  Difficulty turning over in bed (including adjusting  bedclothes, sheets and blankets)?: A Little Difficulty moving from lying on back to sitting on the side of the bed? : A Little Difficulty sitting down on and standing up from a chair with arms (e.g., wheelchair, bedside commode, etc,.)?: A Little Help needed moving to and from a bed to chair (including a wheelchair)?: A Little Help needed walking in hospital room?: A Lot Help needed climbing 3-5 steps with a railing? : Total 6 Click Score: 15    End of Session Equipment Utilized During Treatment: Oxygen Activity Tolerance: Patient tolerated treatment well Patient left: in bed;with call bell/phone within reach;Other (comment) (refused alarm)   PT Visit Diagnosis: Difficulty in walking, not elsewhere classified (R26.2);Muscle weakness (generalized) (M62.81)     Time: 4163-8453 PT Time Calculation (min) (ACUTE ONLY): 27 min  Charges:  $Therapeutic Exercise: 8-22 mins $Therapeutic Activity: 8-22 mins                    G Codes:        Larae Grooms, PTA 11/13/2016, 11:49 AM

## 2016-11-13 NOTE — Plan of Care (Signed)
Problem: Education: Goal: Knowledge of [facility] Education information/materials will improve Outcome: Completed/Met Date Met: 11/13/16 Pt complained of pain in back and cough, treated once with norco & robitussin. Pt up to [facility] indepedently tolerating well, pt went down for echo, pt on 3L O2 chronically

## 2016-11-13 NOTE — Progress Notes (Signed)
Patient Name: [name] Date of Encounter: 11/13/2016  Primary Cardiologist: [facility] Problem List     Active Problems:   Acute exacerbation of CHF (congestive heart failure) (HCC)     Subjective   Feels less SOB this morning. Renal function continues to improve off diuretic. Minus 600 mL for the past 24 hours and -4.8 L for the admission. Weight up 5 pounds overnight.   Inpatient Medications    Scheduled Meds: . albuterol  2.5 mg Nebulization BID  . aspirin EC  81 mg Oral Daily  . budesonide  0.25 mg Nebulization BID  . Chlorhexidine Gluconate Cloth  6 each Topical Q0600  . citalopram  40 mg Oral Daily  . enoxaparin (LOVENOX) injection  40 mg Subcutaneous BID  . hydrALAZINE  100 mg Oral Q8H  . Influenza vac split quadrivalent PF  0.5 mL Intramuscular Tomorrow-1000  . insulin aspart  0-5 Units Subcutaneous QHS  . insulin aspart  0-9 Units Subcutaneous TID WC  . insulin aspart  3 Units Subcutaneous TID WC  . insulin glargine  23 Units Subcutaneous QHS  . metoprolol tartrate  100 mg Oral BID  . mupirocin ointment  1 application Nasal BID  . potassium chloride  20 mEq Oral BID  . sodium chloride flush  3 mL Intravenous Q12H   Continuous Infusions: . sodium chloride     PRN Meds: sodium chloride, acetaminophen, diphenhydrAMINE, guaiFENesin-dextromethorphan, hydrALAZINE, HYDROcodone-acetaminophen, labetalol, ondansetron (ZOFRAN) IV, polyethylene glycol, sodium chloride flush   Vital Signs    Vitals:   11/12/16 1450 11/12/16 1955 11/12/16 2047 11/13/16 0339  BP: 140/71 (!) 159/86  (!) 142/70  Pulse: 69 74  66  Resp:  20  17  Temp:  97.6 F (36.4 C)  98.3 F (36.8 C)  TempSrc:  Oral  Oral  SpO2:  97% 97% 96%  Weight:    (!) 337 lb 3.2 oz (153 kg)  Height:        Intake/Output Summary (Last 24 hours) at 11/13/16 0811 Last data filed at 11/13/16 0554  Gross per 24 hour  Intake                0 ml  Output              600 ml  Net              -600 ml   Filed Weights   11/11/16 0444 11/12/16 0326 11/13/16 0339  Weight: (!) 333 lb 12.8 oz (151.4 kg) (!) 332 lb 4.8 oz (150.7 kg) (!) 337 lb 3.2 oz (153 kg)    Physical Exam    GEN: Well nourished, well developed, in no acute distress.  HEENT: Grossly normal.  Neck: Supple, JVD difficult to assess, no carotid bruits, or masses. Cardiac: RRR, no murmurs, rubs, or gallops. No clubbing, cyanosis, edema.  Radials/DP/PT 2+ and equal bilaterally.  Respiratory:  Diminished bilateral bases. GI: Obese, soft, nontender, mildly distended, BS + x 4. MS: no deformity or atrophy. Skin: warm and dry, no rash. Neuro:  Strength and sensation are intact. Psych: AAOx3.  Normal affect.  Labs    CBC  Recent Labs  11/12/16 1004  WBC 4.9  HGB 10.1*  HCT 31.1*  MCV 85.3  PLT 109   Basic Metabolic Panel  Recent Labs  11/12/16 0405 11/13/16 0353  NA 136 138  K 4.1 4.3  CL 94* 95*  CO2 35* 35*  GLUCOSE 216* 124*  BUN 44*  44*  CREATININE 2.25* 2.09*  CALCIUM 8.0* 8.0*   Liver Function Tests No results for input(s): AST, ALT, ALKPHOS, BILITOT, PROT, ALBUMIN in the last 72 hours. No results for input(s): LIPASE, AMYLASE in the last 72 hours. Cardiac Enzymes No results for input(s): CKTOTAL, CKMB, CKMBINDEX, TROPONINI in the last 72 hours. BNP Invalid input(s): POCBNP D-Dimer No results for input(s): DDIMER in the last 72 hours. Hemoglobin A1C No results for input(s): HGBA1C in the last 72 hours. Fasting Lipid Panel No results for input(s): CHOL, HDL, LDLCALC, TRIG, CHOLHDL, LDLDIRECT in the last 72 hours. Thyroid Function Tests No results for input(s): TSH, T4TOTAL, T3FREE, THYROIDAB in the last 72 hours.  Invalid input(s): FREET3  Telemetry    NSR - Personally Reviewed  ECG    n/a - Personally Reviewed  Radiology    Dg Chest 1 View  Result Date: 11/11/2016 IMPRESSION: Stable cardiomegaly and central pulmonary vascular congestion. Probable small right pleural  effusion. Electronically Signed   By: Marijo Conception, M.D.   On: 11/11/2016 11:50    Cardiac Studies   TTE pending  Patient Profile     46 y.o. female with history of HFpEF, CKD III, COPD/asthma on home O2, HTN, DM II, obesity, and iron deficiency/chronic blood loss anemia, who is being seen for the evaluation of acute on chronic diast CHFat the request of Dr. Posey Pronto.  Assessment & Plan    1. Acute on chronic HFpEF: -She does continue to appear volume up -Renal function improving with holding of Lasix, though still well above baseline -Continue to hold diuresis while echo is pending, discussed with nephrology -CXR showed continued vascular congestion with small right pleural effusion -Await TTE -Patient unable to lay flat for VQ scan -Consider RHC to evaluate for persistently elevated right heart/pulmonary pressure despite inpatient IV diuresis  -Strict Is and Os -CHF education, she reports drinking at least 2 L fluids daily  2. Elevated troponin: -Mildly elevated likely in the setting of volume overload and CKD -Echo pending as above -Would ideally like to avoid LHC given CKD unless indicated by echo -Consider outpatient Myoview to evaluate for high risk ischemia   3. Acute on CKD stage III: -Improving  -Monitor with diuresis   4. HTN: -Improved from admission  Signed, Marcille Blanco [place] Pager: [phone] 11/13/2016, 8:11 AM   Attending Note Patient seen and examined, agree with detailed note above,  Patient presentation and plan discussed on rounds.   Mild shortness of breath this morning At home reports having some chest tightness when she walks associated with her shortness of breath  Echocardiogram reviewed person by myself showing normal LV function, no significant valve disease, mildly elevated right heart pressures 45+ mm Hg  On physical exam she is on 2 L nasal cannula, morbidly obese, unable to estimate JVD, lungs clear to  auscultation bilaterally, heart sounds regular with normal S1-S2 no murmurs appreciated, abdomen obese soft nontender, no significant lower extremity edema  A/P: -Chronic diastolic CHF Given mildly elevated right heart pressures, would continue gentle diuresis with torsemide 40 mg daily Shortness of breath symptoms likely predominately secondary to morbid obesity Ideally needs gastric bypass surgery. She was contemplating having surgery last year For symptom relief will night to likely run her dry/prerenal despite worsening renal function (likely to compensate for underlying morbid obesity causing respiratory distress)  --- Chronic renal failure Worse with overdiuresis,  nephrology following Case discussed with Dr. Holley Raring , best option will be gastric bypass surgery  (  to fix the underlying issue contributing to her chronic respiratory distress )  Greater than 50% was spent in counseling and coordination of care with patient Total encounter time 35 minutes or more   Signed: Esmond Plants  M.D., Ph.D. [facility]

## 2016-11-13 NOTE — Progress Notes (Signed)
[facility], [place] 11/13/16  Subjective:  Renal function has slightly improved. Creatinine down to 2.09. Patient resting comfortably in bed at the moment.  Objective:  Vital signs in last 24 hours:  Temp:  [97.6 F (36.4 C)-98.3 F (36.8 C)] 98.1 F (36.7 C) (09/19 0831) Pulse Rate:  [66-74] 69 (09/19 0831) Resp:  [17-20] 18 (09/19 0831) BP: (140-159)/(70-88) 145/82 (09/19 0831) SpO2:  [94 %-98 %] 94 % (09/19 0831) Weight:  [153 kg (337 lb 3.2 oz)] 153 kg (337 lb 3.2 oz) (09/19 0339)  Weight change: 2.223 kg (4 lb 14.4 oz) Filed Weights   11/11/16 0444 11/12/16 0326 11/13/16 0339  Weight: (!) 151.4 kg (333 lb 12.8 oz) (!) 150.7 kg (332 lb 4.8 oz) (!) 153 kg (337 lb 3.2 oz)    Intake/Output:    Intake/Output Summary (Last 24 hours) at 11/13/16 1007 Last data filed at 11/13/16 0554  Gross per 24 hour  Intake                0 ml  Output              600 ml  Net             -600 ml     Physical Exam: General: Morbidly obese female, laying in the bed   HEENT Anicteric, moist oral mucous membranes   Neck No JVD   Pulm/lungs Normal breathing effort, clear to auscultation   CVS/Heart Regular, no rub or gallop   Abdomen:  Soft, nontender, obese, dependent edema over pannus   Extremities: + Edema   Neurologic: Alert, oriented, conversant  Skin: Abdominal wall edema           Basic Metabolic Panel:   Recent Labs Lab 11/09/16 0557 11/10/16 0419 11/11/16 0521 11/12/16 0405 11/13/16 0353  NA 140 139 136 136 138  K 3.8 3.9 3.9 4.1 4.3  CL 97* 96* 94* 94* 95*  CO2 36* 36* 36* 35* 35*  GLUCOSE 126* 97 188* 216* 124*  BUN 24* 30* 36* 44* 44*  CREATININE 2.06* 2.14* 2.32* 2.25* 2.09*  CALCIUM 8.3* 8.1* 7.8* 8.0* 8.0*     CBC:  Recent Labs Lab 11/07/16 1949 11/08/16 0624 11/12/16 1004  WBC 5.7 5.7 4.9  HGB 11.3* 9.9* 10.1*  HCT 34.3* 30.1* 31.1*  MCV 86.7 87.4 85.3  PLT 291 286 288     No results found for: HEPBSAG,  HEPBSAB, HEPBIGM    Microbiology:  Recent Results (from the past 240 hour(s))  MRSA PCR Screening     Status: Abnormal   Collection Time: 11/08/16  4:57 AM  Result Value Ref Range Status   MRSA by PCR POSITIVE (A) NEGATIVE Final    Comment:        The GeneXpert MRSA Assay (FDA approved for NASAL specimens only), is one component of a comprehensive MRSA colonization surveillance program. It is not intended to diagnose MRSA infection nor to guide or monitor treatment for MRSA infections. RESULT CALLED TO, READ BACK BY AND VERIFIED WITH:  RENEE BABB AT 9024 11/08/16 SDR   Urine Culture     Status: Abnormal   Collection Time: 11/10/16  1:09 PM  Result Value Ref Range Status   Specimen Description URINE, RANDOM  Final   Special Requests NONE  Final   Culture MULTIPLE SPECIES PRESENT, SUGGEST RECOLLECTION (A)  Final   Report Status 11/12/2016 FINAL  Final    Coagulation Studies: No results for input(s): LABPROT, INR  in the last 72 hours.  Urinalysis:  Recent Labs  11/10/16 1309  COLORURINE YELLOW*  LABSPEC 1.011  PHURINE 5.0  GLUCOSEU NEGATIVE  HGBUR NEGATIVE  BILIRUBINUR NEGATIVE  KETONESUR NEGATIVE  PROTEINUR >=300*  NITRITE NEGATIVE  LEUKOCYTESUR NEGATIVE      Imaging: Dg Chest 1 View  Result Date: 11/11/2016 CLINICAL DATA:  Acute exacerbation of congestive heart failure. EXAM: CHEST 1 VIEW COMPARISON:  Radiographs of November 07, 2016. FINDINGS: Stable cardiomegaly and central pulmonary vascular congestion is noted. No pneumothorax is noted. Stable elevated right hemidiaphragm is noted. Bony thorax is unremarkable. Probable small right pleural effusion is noted. IMPRESSION: Stable cardiomegaly and central pulmonary vascular congestion. Probable small right pleural effusion. Electronically Signed   By: Marijo Conception, M.D.   On: 11/11/2016 11:50     Medications:   . sodium chloride     . albuterol  2.5 mg Nebulization BID  . aspirin EC  81 mg Oral Daily   . budesonide  0.25 mg Nebulization BID  . Chlorhexidine Gluconate Cloth  6 each Topical Q0600  . citalopram  40 mg Oral Daily  . enoxaparin (LOVENOX) injection  40 mg Subcutaneous BID  . hydrALAZINE  100 mg Oral Q8H  . Influenza vac split quadrivalent PF  0.5 mL Intramuscular Tomorrow-1000  . insulin aspart  0-5 Units Subcutaneous QHS  . insulin aspart  0-9 Units Subcutaneous TID WC  . insulin aspart  3 Units Subcutaneous TID WC  . insulin glargine  23 Units Subcutaneous QHS  . metoprolol tartrate  100 mg Oral BID  . mupirocin ointment  1 application Nasal BID  . potassium chloride  20 mEq Oral BID  . sodium chloride flush  3 mL Intravenous Q12H   sodium chloride, acetaminophen, diphenhydrAMINE, guaiFENesin-dextromethorphan, hydrALAZINE, HYDROcodone-acetaminophen, labetalol, ondansetron (ZOFRAN) IV, polyethylene glycol, sodium chloride flush  Assessment/ Plan:  46 y.o. African-American female with morbid obesity, insulin-dependent diabetes, chronic kidney disease stage III, concentric left ventricular hypertrophy, COPD, hypertension, proteinuria She presents for accelerated hypertension due to no medications for several days after she lost her insurance coverage  1. Acute kidney injury/acute renal failure 2. Chronic kidney disease stage III. Baseline creatinine 1.53/GFR 46 from 11/07/2016 3. Proteinuria, 5 g in May 2018. ANA negative in October 2016, SPEP negative for M spike 06/2016 4. Hypertension and dependent edema 5. Morbid obesity 6. Diabetes type 2 with CKD stage III  Plan: Echocardiogram has been completed. Patient has normal left ventricular ejection fraction with mild to moderate mitral regurgitation and mild diastolic dysfunction.  No pulmonary hypertension mentioned. Defer decision regarding diuretics to cardiology for now.    LOS: 6 Irma Roulhac 9/19/201810:07 [place], [place]

## 2016-11-13 NOTE — Care Management (Signed)
Renal status improving.  Plan to initiate torsemide for diuresis.  have requested home oxygen assessment.

## 2016-11-13 NOTE — Progress Notes (Signed)
   Reviewed echo with Dr. Rockey Situ. Right-side pressure mildly to moderately elevated at 47-50 mmHg per Dr. Donivan Scull review. Will start PO torsemide 40 mg daily (home dose) per Dr. Rockey Situ.

## 2016-11-14 LAB — BASIC METABOLIC PANEL
Anion gap: 6 (ref 5–15)
BUN: 48 mg/dL — AB (ref 6–20)
CHLORIDE: 96 mmol/L — AB (ref 101–111)
CO2: 35 mmol/L — AB (ref 22–32)
CREATININE: 2.02 mg/dL — AB (ref 0.44–1.00)
Calcium: 8.1 mg/dL — ABNORMAL LOW (ref 8.9–10.3)
GFR calc Af Amer: 33 mL/min — ABNORMAL LOW (ref >60)
GFR calc non Af Amer: 29 mL/min — ABNORMAL LOW (ref >60)
GLUCOSE: 126 mg/dL — AB (ref 65–99)
Potassium: 4.4 mmol/L (ref 3.5–5.1)
SODIUM: 137 mmol/L (ref 135–145)

## 2016-11-14 LAB — GLUCOSE, CAPILLARY
GLUCOSE-CAPILLARY: 124 mg/dL — AB (ref 65–99)
GLUCOSE-CAPILLARY: 145 mg/dL — AB (ref 65–99)
Glucose-Capillary: 93 mg/dL (ref 65–99)
Glucose-Capillary: 97 mg/dL (ref 65–99)

## 2016-11-14 MED ORDER — SENNA 8.6 MG PO TABS
1.0000 | ORAL_TABLET | Freq: Every day | ORAL | Status: DC
  Administered 2016-11-14 – 2016-11-16 (×3): 8.6 mg via ORAL
  Filled 2016-11-14 (×3): qty 1

## 2016-11-14 MED ORDER — POLYETHYLENE GLYCOL 3350 17 G PO PACK
17.0000 g | PACK | Freq: Every day | ORAL | Status: DC
  Administered 2016-11-14 – 2016-11-15 (×3): 17 g via ORAL
  Filled 2016-11-14 (×3): qty 1

## 2016-11-14 MED ORDER — HYDROCODONE-ACETAMINOPHEN 5-325 MG PO TABS
1.0000 | ORAL_TABLET | ORAL | Status: DC | PRN
  Administered 2016-11-14 (×2): 2 via ORAL
  Administered 2016-11-14: 1 via ORAL
  Administered 2016-11-15 – 2016-11-16 (×2): 2 via ORAL
  Filled 2016-11-14 (×2): qty 2
  Filled 2016-11-14: qty 1
  Filled 2016-11-14 (×2): qty 2

## 2016-11-14 MED ORDER — DOCUSATE SODIUM 100 MG PO CAPS
100.0000 mg | ORAL_CAPSULE | Freq: Two times a day (BID) | ORAL | Status: DC
  Administered 2016-11-14 – 2016-11-16 (×5): 100 mg via ORAL
  Filled 2016-11-14 (×5): qty 1

## 2016-11-14 NOTE — Progress Notes (Signed)
PT Cancellation Note  Patient Details Name: [name] MRN: [id number] DOB: 04/13/1970   Cancelled Treatment:    Reason Eval/Treat Not Completed: Patient declined, no reason specified Attempted to see pt earlier today, pt had taken meds and reported feeling too woozy to try getting up.  Kreg Shropshire, DPT 11/14/2016, 4:57 PM

## 2016-11-14 NOTE — Discharge Instructions (Signed)
[facility] Failure [facility] appointment on November 20 2016 at 11:40am with [name], [place]. Please call [phone] to reschedule.

## 2016-11-14 NOTE — Progress Notes (Signed)
[facility] NAME: [name]    MR#:  [id number]  DATE OF BIRTH:  1970/03/31  SUBJECTIVE:   Patient Feels bloated this morning. Has not had a bowel movement for a few days.  REVIEW OF SYSTEMS:    Review of Systems  Constitutional: Negative for fever, chills weight loss HENT: Negative for ear pain, nosebleeds, congestion, facial swelling, rhinorrhea, neck pain, neck stiffness and ear discharge.   Respiratory: Negative for cough, Positive shortness of breath (better), no wheezing  Cardiovascular:, NO palpitations denies chest pain ++ LEE Gastrointestinal: Negative for heartburn, abdominal pain, vomiting, diarrhea ++ consitpation Positive fluid in abdomen Genitourinary: Negative for dysuria, urgency, frequency, hematuria Musculoskeletal: Negative for back pain or joint pain She has soreness all over Neurological: Negative for dizziness, seizures, syncope, focal weakness,  numbness and headaches.  Hematological: Does not bruise/bleed easily.  Psychiatric/Behavioral: Negative for hallucinations, confusion, dysphoric mood    Tolerating Diet: yes      DRUG ALLERGIES:   Allergies  Allergen Reactions  . Dust Mite Extract Shortness Of Breath and Itching  . Mold Extract [Trichophyton] Shortness Of Breath and Itching  . Feraheme [Ferumoxytol] Itching  . Pollen Extract   . Sulfa Antibiotics Itching    VITALS:  Blood pressure (!) 169/88, pulse 68, temperature 98.7 F (37.1 C), temperature source Oral, resp. rate 18, height 5\' 4"  (1.626 m), weight (!) 152.4 kg (336 lb), last menstrual period 09/06/2016, SpO2 100 %.  PHYSICAL EXAMINATION:  Constitutional: Appears well-developed and well-nourished. No distress. HENT: Normocephalic. [name] Oropharynx is clear and moist.  Eyes: Conjunctivae and EOM are normal. PERRLA, no scleral icterus.  Neck: Normal ROM. Neck supple. No JVD. No tracheal deviation. CVS: RRR, S1/S2 +, no murmurs, no  gallops, no carotid bruit.  Pulmonary: Effort and Diminished breath sounds throughout, no stridor, rhonchi, wheezes, rales.  Abdominal: Soft. BS +,  Positive abdominal distension/swelling, no tenderness, rebound or guarding.  Musculoskeletal: Normal range of motion.2+ LEE  and no tenderness.  Neuro: Alert. CN 2-12 grossly intact. No focal deficits. Skin: Skin is warm and dry. No rash noted. Psychiatric: Normal mood and affect.      LABORATORY PANEL:   CBC  Recent Labs Lab 11/12/16 1004  WBC 4.9  HGB 10.1*  HCT 31.1*  PLT 288   ------------------------------------------------------------------------------------------------------------------  Chemistries   Recent Labs Lab 11/14/16 0443  NA 137  K 4.4  CL 96*  CO2 35*  GLUCOSE 126*  BUN 48*  CREATININE 2.02*  CALCIUM 8.1*   ------------------------------------------------------------------------------------------------------------------  Cardiac Enzymes  Recent Labs Lab 11/08/16 0624 11/08/16 1043 11/08/16 1630  TROPONINI 0.06* 0.06* 0.05*   ------------------------------------------------------------------------------------------------------------------  RADIOLOGY:  Dg Chest 1 View  Result Date: 11/13/2016 CLINICAL DATA:  Shortness of breath. EXAM: CHEST 1 VIEW COMPARISON:  Radiograph of November 11, 2016. FINDINGS: Stable cardiomegaly with central pulmonary vascular congestion. No pneumothorax is noted. Mild right pleural effusion is noted with probable associated atelectasis or infiltrate. Bony thorax is unremarkable. IMPRESSION: Stable cardiomegaly with central pulmonary vascular congestion. Mild right pleural effusion is noted with probable associated atelectasis or infiltrate. Electronically Signed   By: Marijo Conception, M.D.   On: 11/13/2016 12:33     ASSESSMENT AND PLAN:   46 year old morbidly obese female who presented with shortness of breath.  1. Acute on chronic hypoxic respiratory failure in  the setting of acute on chronic diastolic heart failure due to poor adherence to daily diuretic therapy Cardiology consultation is appreciated  Echocardiogram Shows normal left ventricular ejection fraction with mild to moderate mitral regurgitation and mild diastolic dysfunction and right sided pressures are mild to moderately elevated. Continue torsemide 40 mg daily with plans to use zoroxyln tomorrow  2. Chest pain with Elevated troponin on admission due to demand ischemia. Patient has been ruled out for ACS.  I think her chest pain is more due to fluid overload   3. Hypertension:I have discontinue Norvasc as this may also play a role in patient's lower extremity edema.  Continue metoprolol and hydralazine    4. Hyperlipidemia: I have discontinue statin due to patient's complaint of body aches 5. Diabetes: A1c 7.2 With proteinuria  Continue Lantus with sliding scale and ADA diet  6. Morbid obesity: Patient will need outpatient sleep evaluation  7. Iron deficiency anemia: Hemoglobin remained stable  8. COPD with chronic respiratory failure: No signs of exacerbation 9. Acute on chronic kidney disease stage III: creatinine with improvement this morning.   Repeat BMP in a.m. Appreciate nephrology consultation.  10. Constipation: Start MiraLAX, Colace and senna.   Physical therapy is recommended skilled nursing facility at discharge.    Management plans discussed with the patient and she is in agreement.  CODE STATUS: full  TOTAL TIME TAKING CARE OF THIS PATIENT: 23 minutes.   D/w cardiology    POSSIBLE D/C 1-2 days, DEPENDING ON CLINICAL CONDITION.   Orley Lawry M.D on 11/14/2016 at 10:55 AM  Between 7am to 6pm - Pager - [phone] After 6pm go to [email or web address] - password [email or web address] [facility]  Office  [phone]  CC: Primary care physician; [name], NP  Note: This dictation was prepared with Dragon dictation along with smaller  phrase technology. Any transcriptional errors that result from this process are unintentional.

## 2016-11-14 NOTE — Care Management (Addendum)
Discussed the need for anticipated discharge medication scripts with attending so that may obtain them from [facility] At present anticipate- metoprolol, ASA, hydralazine, potassium, Torsemide.  Patient has not completed the [facility].  Discussed could expedite the appllication process by completing them now. Confirmed patient does not have a home nebulizer.  Discussed during progression to increase mobilization of patient as she is declining home health and has no payor

## 2016-11-14 NOTE — Progress Notes (Signed)
Pt reports no relief from prn pain medication. MD notified. Orders to increase dose to 10mg . I will continue to assess.

## 2016-11-14 NOTE — Progress Notes (Addendum)
Patient Name: [name] Date of Encounter: 11/14/2016  Primary Cardiologist: [facility] Problem List     Active Problems:   Acute exacerbation of CHF (congestive heart failure) (HCC)     Subjective   Less SOB. Still feels like abdomen is distended. Echo showed elevated right-side pressure. Restarted on po torsemide 9/19. Renal function continues to improve. UOP 7 L for the admission and 1.6 L for the past 24 hours. Weight down 1 pound for the past 24 hours, though up at least 7 pounds for the admission.   Inpatient Medications    Scheduled Meds: . albuterol  2.5 mg Nebulization BID  . aspirin EC  81 mg Oral Daily  . budesonide  0.25 mg Nebulization BID  . Chlorhexidine Gluconate Cloth  6 each Topical Q0600  . citalopram  40 mg Oral Daily  . enoxaparin (LOVENOX) injection  40 mg Subcutaneous BID  . hydrALAZINE  100 mg Oral Q8H  . Influenza vac split quadrivalent PF  0.5 mL Intramuscular Tomorrow-1000  . insulin aspart  0-5 Units Subcutaneous QHS  . insulin aspart  0-9 Units Subcutaneous TID WC  . insulin aspart  3 Units Subcutaneous TID WC  . insulin glargine  23 Units Subcutaneous QHS  . metoprolol tartrate  100 mg Oral BID  . mupirocin ointment  1 application Nasal BID  . potassium chloride  20 mEq Oral BID  . sodium chloride flush  3 mL Intravenous Q12H  . torsemide  40 mg Oral Daily   Continuous Infusions: . sodium chloride     PRN Meds: sodium chloride, acetaminophen, diphenhydrAMINE, guaiFENesin-dextromethorphan, hydrALAZINE, HYDROcodone-acetaminophen, labetalol, ondansetron (ZOFRAN) IV, polyethylene glycol, sodium chloride flush   Vital Signs    Vitals:   11/13/16 1424 11/13/16 1920 11/13/16 2006 11/14/16 0400  BP: (!) 148/79 (!) 155/86  (!) 169/88  Pulse: 70 73  68  Resp:  18  18  Temp:  99 F (37.2 C)  98.7 F (37.1 C)  TempSrc:  Oral  Oral  SpO2: 99% 98% 98% 100%  Weight:    (!) 336 lb (152.4 kg)  Height:        Intake/Output  Summary (Last 24 hours) at 11/14/16 0930 Last data filed at 11/14/16 0800  Gross per 24 hour  Intake                0 ml  Output             1601 ml  Net            -1601 ml   Filed Weights   11/12/16 0326 11/13/16 0339 11/14/16 0400  Weight: (!) 332 lb 4.8 oz (150.7 kg) (!) 337 lb 3.2 oz (153 kg) (!) 336 lb (152.4 kg)    Physical Exam    GEN: Well nourished, well developed, in no acute distress.  HEENT: Grossly normal.  Neck: Supple, JVD difficult to assess, no carotid bruits, or masses. Cardiac: RRR, no murmurs, rubs, or gallops. No clubbing, cyanosis, edema.  Radials/DP/PT 2+ and equal bilaterally.  Respiratory:  Diminished bilateral bases. GI: Obese, soft, nontender, mildly distended, BS + x 4. MS: no deformity or atrophy. Skin: warm and dry, no rash. Neuro:  Strength and sensation are intact. Psych: AAOx3.  Normal affect.  Labs    CBC  Recent Labs  11/12/16 1004  WBC 4.9  HGB 10.1*  HCT 31.1*  MCV 85.3  PLT 824   Basic Metabolic Panel  Recent Labs  11/13/16 0353 11/14/16 0443  NA 138 137  K 4.3 4.4  CL 95* 96*  CO2 35* 35*  GLUCOSE 124* 126*  BUN 44* 48*  CREATININE 2.09* 2.02*  CALCIUM 8.0* 8.1*   Liver Function Tests No results for input(s): AST, ALT, ALKPHOS, BILITOT, PROT, ALBUMIN in the last 72 hours. No results for input(s): LIPASE, AMYLASE in the last 72 hours. Cardiac Enzymes No results for input(s): CKTOTAL, CKMB, CKMBINDEX, TROPONINI in the last 72 hours. BNP Invalid input(s): POCBNP D-Dimer No results for input(s): DDIMER in the last 72 hours. Hemoglobin A1C No results for input(s): HGBA1C in the last 72 hours. Fasting Lipid Panel No results for input(s): CHOL, HDL, LDLCALC, TRIG, CHOLHDL, LDLDIRECT in the last 72 hours. Thyroid Function Tests No results for input(s): TSH, T4TOTAL, T3FREE, THYROIDAB in the last 72 hours.  Invalid input(s): FREET3  Telemetry    NSR - Personally Reviewed  ECG    n/a - Personally  Reviewed  Radiology    Dg Chest 1 View  Result Date: 11/11/2016 IMPRESSION: Stable cardiomegaly and central pulmonary vascular congestion. Probable small right pleural effusion. Electronically Signed   By: Marijo Conception, M.D.   On: 11/11/2016 11:50    Cardiac Studies   TTE pending  Patient Profile     46 y.o. female with history of HFpEF, CKD III, COPD/asthma on home O2, HTN, DM II, obesity, and iron deficiency/chronic blood loss anemia, who is being seen for the evaluation of acute on chronic diast CHFat the request of Dr. Posey Pronto.  Assessment & Plan    1. Acute on chronic HFpEF: -She does continue to appear volume up -Initially renal function worsened with diuresis leading to holding of IV Lasix with improvement in renal function, though still not to baseline. TTE showed elevated right side pressure. PO torsemide resumed on 9/19 with good UOP and continued improvement in renal function -Continue PO torsemide 40 mg daily -Consider prn metolazone, though would like to monitor renal functio further prior to giving this -Consider RHC to evaluate for persistently elevated right heart/pulmonary pressure despite inpatient IV diuresis  -Strict Is and Os -CHF education, she reports drinking at least 2 L fluids daily  2. Elevated troponin: -Mildly elevated likely in the setting of volume overload and CKD -Echo pending as above -Would ideally like to avoid LHC given CKD unless indicated by echo -Consider outpatient Myoview to evaluate for high risk ischemia   3. Acute on CKD stage III: -Improving  -Monitor with diuresis   4. HTN: -Improved from admission  Signed, Marcille Blanco Russell Pager: [phone] 11/14/2016, 9:30 AM   Attending Note Patient seen and examined, agree with detailed note above,  Patient presentation and plan discussed on rounds.   Mild shortness of breath this morning At home reports having some chest tightness when she walks associated  with her shortness of breath  Echocardiogram reviewed person by myself showing normal LV function, no significant valve disease, mildly elevated right heart pressures 45+ mm Hg  On physical exam she is on 2 L nasal cannula, morbidly obese, unable to estimate JVD, lungs clear to auscultation bilaterally, heart sounds regular with normal S1-S2 no murmurs appreciated, abdomen obese soft nontender, no significant lower extremity edema  A/P: -Chronic diastolic CHF Given mildly elevated right heart pressures, would continue gentle diuresis with torsemide 40 mg daily Shortness of breath symptoms likely predominately secondary to morbid obesity Ideally needs gastric bypass surgery. She was contemplating having surgery last year For  symptom relief will night to likely run her dry/prerenal despite worsening renal function (likely to compensate for underlying morbid obesity causing respiratory distress)  --- Chronic renal failure Worse with overdiuresis,  nephrology following Case discussed with Dr. Holley Raring , best option will be gastric bypass surgery  (to fix the underlying issue contributing to her chronic respiratory distress )  Greater than 50% was spent in counseling and coordination of care with patient Total encounter time 35 minutes or more   Signed: Esmond Plants  M.D., Ph.D. [facility]

## 2016-11-14 NOTE — Progress Notes (Signed)
[facility], [place] 11/14/16  Subjective:  Renal function stable at the moment. Patient has had elevated pulmonary pressure per cardiology. She was restarted on torsemide.  Objective:  Vital signs in last 24 hours:  Temp:  [98.2 F (36.8 C)-99 F (37.2 C)] 98.7 F (37.1 C) (09/20 0400) Pulse Rate:  [68-79] 68 (09/20 0400) Resp:  [18] 18 (09/20 0400) BP: (148-169)/(79-95) 169/88 (09/20 0400) SpO2:  [89 %-100 %] 100 % (09/20 0400) Weight:  [152.4 kg (336 lb)] 152.4 kg (336 lb) (09/20 0400)  Weight change: -0.544 kg (-1 lb 3.2 oz) Filed Weights   11/12/16 0326 11/13/16 0339 11/14/16 0400  Weight: (!) 150.7 kg (332 lb 4.8 oz) (!) 153 kg (337 lb 3.2 oz) (!) 152.4 kg (336 lb)    Intake/Output:    Intake/Output Summary (Last 24 hours) at 11/14/16 0959 Last data filed at 11/14/16 0800  Gross per 24 hour  Intake                0 ml  Output             1601 ml  Net            -1601 ml     Physical Exam: General: Morbidly obese female, laying in the bed   HEENT Anicteric, moist oral mucous membranes   Neck No JVD   Pulm/lungs Normal breathing effort, clear to auscultation   CVS/Heart Regular, no rub or gallop   Abdomen:  Soft, nontender, obese, dependent edema over pannus   Extremities: + Edema   Neurologic: Alert, oriented, conversant  Skin: Abdominal wall edema           Basic Metabolic Panel:   Recent Labs Lab 11/10/16 0419 11/11/16 0521 11/12/16 0405 11/13/16 0353 11/14/16 0443  NA 139 136 136 138 137  K 3.9 3.9 4.1 4.3 4.4  CL 96* 94* 94* 95* 96*  CO2 36* 36* 35* 35* 35*  GLUCOSE 97 188* 216* 124* 126*  BUN 30* 36* 44* 44* 48*  CREATININE 2.14* 2.32* 2.25* 2.09* 2.02*  CALCIUM 8.1* 7.8* 8.0* 8.0* 8.1*     CBC:  Recent Labs Lab 11/07/16 1949 11/08/16 0624 11/12/16 1004  WBC 5.7 5.7 4.9  HGB 11.3* 9.9* 10.1*  HCT 34.3* 30.1* 31.1*  MCV 86.7 87.4 85.3  PLT 291 286 288     No results found for: HEPBSAG, HEPBSAB,  HEPBIGM    Microbiology:  Recent Results (from the past 240 hour(s))  MRSA PCR Screening     Status: Abnormal   Collection Time: 11/08/16  4:57 AM  Result Value Ref Range Status   MRSA by PCR POSITIVE (A) NEGATIVE Final    Comment:        The GeneXpert MRSA Assay (FDA approved for NASAL specimens only), is one component of a comprehensive MRSA colonization surveillance program. It is not intended to diagnose MRSA infection nor to guide or monitor treatment for MRSA infections. RESULT CALLED TO, READ BACK BY AND VERIFIED WITH:  RENEE BABB AT 4627 11/08/16 SDR   Urine Culture     Status: Abnormal   Collection Time: 11/10/16  1:09 PM  Result Value Ref Range Status   Specimen Description URINE, RANDOM  Final   Special Requests NONE  Final   Culture MULTIPLE SPECIES PRESENT, SUGGEST RECOLLECTION (A)  Final   Report Status 11/12/2016 FINAL  Final    Coagulation Studies: No results for input(s): LABPROT, INR in the last 72 hours.  Urinalysis: No results for input(s): COLORURINE, LABSPEC, PHURINE, GLUCOSEU, HGBUR, BILIRUBINUR, KETONESUR, PROTEINUR, UROBILINOGEN, NITRITE, LEUKOCYTESUR in the last 72 hours.  Invalid input(s): APPERANCEUR    Imaging: Dg Chest 1 View  Result Date: 11/13/2016 CLINICAL DATA:  Shortness of breath. EXAM: CHEST 1 VIEW COMPARISON:  Radiograph of November 11, 2016. FINDINGS: Stable cardiomegaly with central pulmonary vascular congestion. No pneumothorax is noted. Mild right pleural effusion is noted with probable associated atelectasis or infiltrate. Bony thorax is unremarkable. IMPRESSION: Stable cardiomegaly with central pulmonary vascular congestion. Mild right pleural effusion is noted with probable associated atelectasis or infiltrate. Electronically Signed   By: Marijo Conception, M.D.   On: 11/13/2016 12:33     Medications:   . sodium chloride     . albuterol  2.5 mg Nebulization BID  . aspirin EC  81 mg Oral Daily  . budesonide  0.25 mg  Nebulization BID  . Chlorhexidine Gluconate Cloth  6 each Topical Q0600  . citalopram  40 mg Oral Daily  . enoxaparin (LOVENOX) injection  40 mg Subcutaneous BID  . hydrALAZINE  100 mg Oral Q8H  . Influenza vac split quadrivalent PF  0.5 mL Intramuscular Tomorrow-1000  . insulin aspart  0-5 Units Subcutaneous QHS  . insulin aspart  0-9 Units Subcutaneous TID WC  . insulin aspart  3 Units Subcutaneous TID WC  . insulin glargine  23 Units Subcutaneous QHS  . metoprolol tartrate  100 mg Oral BID  . mupirocin ointment  1 application Nasal BID  . potassium chloride  20 mEq Oral BID  . sodium chloride flush  3 mL Intravenous Q12H  . torsemide  40 mg Oral Daily   sodium chloride, acetaminophen, diphenhydrAMINE, guaiFENesin-dextromethorphan, hydrALAZINE, HYDROcodone-acetaminophen, labetalol, ondansetron (ZOFRAN) IV, polyethylene glycol, sodium chloride flush  Assessment/ Plan:  46 y.o. African-American female with morbid obesity, insulin-dependent diabetes, chronic kidney disease stage III, concentric left ventricular hypertrophy, COPD, hypertension, proteinuria She presents for accelerated hypertension due to no medications for several days after she lost her insurance coverage  1. Acute kidney injury/acute renal failure 2. Chronic kidney disease stage III. Baseline creatinine 1.53/GFR 46 from 11/07/2016 3. Proteinuria, 5 g in May 2018. ANA negative in October 2016, SPEP negative for M spike 06/2016 4. Hypertension and dependent edema 5. Morbid obesity 6. Diabetes type 2 with CKD stage III  Plan: Patient continues to have some signs of volume overload. Agree with reinitiation of torsemide. We will need to monitor renal function closely while the patient remains on this. We will likely need to accept a higher creatinine given the need for diuresis.  Urine output yesterday was good at 2.1 L. We will continue to monitor the patient's progress.    LOS: [place] 9/20/20189:59  AM  [place], [place]

## 2016-11-15 ENCOUNTER — Ambulatory Visit: Payer: Self-pay | Admitting: Family

## 2016-11-15 LAB — BASIC METABOLIC PANEL
Anion gap: 8 (ref 5–15)
BUN: 50 mg/dL — AB (ref 6–20)
CO2: 33 mmol/L — ABNORMAL HIGH (ref 22–32)
CREATININE: 2.15 mg/dL — AB (ref 0.44–1.00)
Calcium: 8.1 mg/dL — ABNORMAL LOW (ref 8.9–10.3)
Chloride: 97 mmol/L — ABNORMAL LOW (ref 101–111)
GFR calc non Af Amer: 27 mL/min — ABNORMAL LOW (ref >60)
GFR, EST AFRICAN AMERICAN: 31 mL/min — AB (ref >60)
Glucose, Bld: 119 mg/dL — ABNORMAL HIGH (ref 65–99)
POTASSIUM: 4.8 mmol/L (ref 3.5–5.1)
Sodium: 138 mmol/L (ref 135–145)

## 2016-11-15 LAB — GLUCOSE, CAPILLARY
GLUCOSE-CAPILLARY: 103 mg/dL — AB (ref 65–99)
GLUCOSE-CAPILLARY: 151 mg/dL — AB (ref 65–99)
Glucose-Capillary: 154 mg/dL — ABNORMAL HIGH (ref 65–99)
Glucose-Capillary: 165 mg/dL — ABNORMAL HIGH (ref 65–99)

## 2016-11-15 MED ORDER — HEPARIN SODIUM (PORCINE) 5000 UNIT/ML IJ SOLN
5000.0000 [IU] | Freq: Three times a day (TID) | INTRAMUSCULAR | Status: DC
  Administered 2016-11-15 – 2016-11-16 (×2): 5000 [IU] via SUBCUTANEOUS
  Filled 2016-11-15 (×2): qty 1

## 2016-11-15 NOTE — Care Management (Signed)
Per attending, patient will not discharge over the weekend due to worsening renal status.  CM is not able to send any prescriptions to the [facility].  Will leave weekend CM report to use MATCH if patient discharges home over the weekend after all

## 2016-11-15 NOTE — Progress Notes (Signed)
Physical Therapy Treatment Patient Details Name: [name] MRN: [id number] DOB: [birth year] Today's Date: 11/15/2016    History of Present Illness Ptis a 46 y.o.femalewith a known history of asthma, COPD,diastolic congestive heart failure, CKD3, diabetes, hypertension, iron deficiency anemia, morbid obesitypresents to the emergency department for evaluation of shortness of breath. Patient was in a usual state of health until 3-4 days when she describes progressively worsening shortness of breath, dyspnea on exertion associated with left side and lateral chest pain, pain with inspiration. Patient states that she has been out of most of her medications for 3-4 days as she has not been able to obtain them secondary to cost and lack of insurance coverage.  Assessment includes: acute on chronic hypoxic respiratory failure in setting of acute on chronic diastolic heart failure, elevated troponin due to demand ischemia, HTN, HLD, DM, COPD, iron deficiency anemia, and COPD.     PT Comments    Pt is making progress towards goals. Pt states she feels stiff in her LB and joints, performed supine there-ex for UE and LEs. Pt appeared sleepy but motivated to perform there-ex. Encouraged pt to perform bed mobility and sit at EOB to change positions, however pt refused.    Follow Up Recommendations  SNF     Equipment Recommendations  Rolling walker with 5" wheels    Recommendations for Other Services       Precautions / Restrictions Precautions Precautions: Fall Restrictions Weight Bearing Restrictions: No    Mobility  Bed Mobility               General bed mobility comments: deferred  Transfers                 General transfer comment: deferred  Ambulation/Gait             General Gait Details: deferred   Stairs            Wheelchair Mobility    Modified Rankin (Stroke Patients Only)       Balance Overall balance assessment:  (deferred)          Standing balance support:  (deferred)                                Cognition Arousal/Alertness: Awake/alert Behavior During Therapy: WFL for tasks assessed/performed Overall Cognitive Status: Within Functional Limits for tasks assessed                                        Exercises Other Exercises Other Exercises: Supine there-ex 12x both sides, ankle pumps, quad sets, hip abd/add, heel slides, arm abduction raises, elbow flexion/extension, forward reach and pull, forward arms raises. Pt performed UE/LE exercises with cues, difficulty performing LE exercises with full ROM due to weakness and body positioning limiting space.     General Comments        Pertinent Vitals/Pain Pain Assessment: 0-10 Pain Score: 6  Pain Location: LB, LE and UE joints Pain Descriptors / Indicators: Sore Pain Intervention(s): Limited activity within patient's tolerance;Monitored during session;Repositioned    Home Living                      Prior Function            PT Goals (current goals can now be found in the  care plan section) Acute Rehab PT Goals Patient Stated Goal: I want to be able to raise my arm higher PT Goal Formulation: With patient Time For Goal Achievement: 11/22/16 Potential to Achieve Goals: Good Progress towards PT goals: Progressing toward goals    Frequency    Min 2X/week      PT Plan Current plan remains appropriate    Co-evaluation              AM-PAC PT "6 Clicks" Daily Activity  Outcome Measure  Difficulty turning over in bed (including adjusting bedclothes, sheets and blankets)?: Unable Difficulty moving from lying on back to sitting on the side of the bed? : Unable Difficulty sitting down on and standing up from a chair with arms (e.g., wheelchair, bedside commode, etc,.)?: Unable Help needed moving to and from a bed to chair (including a wheelchair)?: A Lot Help needed walking in hospital room?: A  Lot Help needed climbing 3-5 steps with a railing? : Total 6 Click Score: 8    End of Session   Activity Tolerance: Patient tolerated treatment well;Patient limited by lethargy Patient left: in bed;with call bell/phone within reach   PT Visit Diagnosis: Other abnormalities of gait and mobility (R26.89);Muscle weakness (generalized) (M62.81);Pain Pain - part of body:  (LB, shoulder/hip/knee joint)     Time: 2536-6440 PT Time Calculation (min) (ACUTE ONLY): 17 min  Charges:                       G Codes:  Functional Assessment Tool Used: AM-PAC 6 Clicks Basic Mobility Functional Limitation: Mobility: Walking and moving around Mobility: Walking and Moving Around Current Status (H4742): At least 80 percent but less than 100 percent impaired, limited or restricted Mobility: Walking and Moving Around Goal Status [phone]): At least 60 percent but less than 80 percent impaired, limited or restricted    Manfred Arch, SPT   Manfred Arch 11/15/2016, 4:29 PM

## 2016-11-15 NOTE — Progress Notes (Signed)
[facility], [place] 11/15/16  Subjective:  Renal function unfortunately worse with the reinitiation of torsemide. creatinine slightly higher at 2.15. Patient not feeling much better than before. Diuresis recorded was 3.4 L yesterday however her weight went up.  Objective:  Vital signs in last 24 hours:  Temp:  [97.6 F (36.4 C)-98.3 F (36.8 C)] 98.3 F (36.8 C) (09/21 0900) Pulse Rate:  [66-76] 70 (09/21 0900) Resp:  [16-18] 18 (09/21 0900) BP: (145-156)/(80-90) 145/86 (09/21 0900) SpO2:  [95 %-100 %] 97 % (09/21 0900) FiO2 (%):  [32 %] 32 % (09/21 0805) Weight:  [154.3 kg (340 lb 1.6 oz)] 154.3 kg (340 lb 1.6 oz) (09/21 0437)  Weight change: 1.86 kg (4 lb 1.6 oz) Filed Weights   11/13/16 0339 11/14/16 0400 11/15/16 0437  Weight: (!) 153 kg (337 lb 3.2 oz) (!) 152.4 kg (336 lb) (!) 154.3 kg (340 lb 1.6 oz)    Intake/Output:    Intake/Output Summary (Last 24 hours) at 11/15/16 1120 Last data filed at 11/15/16 0620  Gross per 24 hour  Intake                0 ml  Output             2600 ml  Net            -2600 ml     Physical Exam: General: Morbidly obese female, sitting up in bed  HEENT Anicteric, moist oral mucous membranes   Neck No JVD   Pulm/lungs Normal breathing effort, clear to auscultation   CVS/Heart Regular, no rub or gallop   Abdomen:  Soft, nontender, obese, dependent edema over pannus   Extremities: + Edema   Neurologic: Alert, oriented, conversant  Skin: Abdominal wall edema           Basic Metabolic Panel:   Recent Labs Lab 11/11/16 0521 11/12/16 0405 11/13/16 0353 11/14/16 0443 11/15/16 0700  NA 136 136 138 137 138  K 3.9 4.1 4.3 4.4 4.8  CL 94* 94* 95* 96* 97*  CO2 36* 35* 35* 35* 33*  GLUCOSE 188* 216* 124* 126* 119*  BUN 36* 44* 44* 48* 50*  CREATININE 2.32* 2.25* 2.09* 2.02* 2.15*  CALCIUM 7.8* 8.0* 8.0* 8.1* 8.1*     CBC:  Recent Labs Lab 11/12/16 1004  WBC 4.9  HGB 10.1*  HCT 31.1*   MCV 85.3  PLT 288     No results found for: HEPBSAG, HEPBSAB, HEPBIGM    Microbiology:  Recent Results (from the past 240 hour(s))  MRSA PCR Screening     Status: Abnormal   Collection Time: 11/08/16  4:57 AM  Result Value Ref Range Status   MRSA by PCR POSITIVE (A) NEGATIVE Final    Comment:        The GeneXpert MRSA Assay (FDA approved for NASAL specimens only), is one component of a comprehensive MRSA colonization surveillance program. It is not intended to diagnose MRSA infection nor to guide or monitor treatment for MRSA infections. RESULT CALLED TO, READ BACK BY AND VERIFIED WITH:  RENEE BABB AT 1937 11/08/16 SDR   Urine Culture     Status: Abnormal   Collection Time: 11/10/16  1:09 PM  Result Value Ref Range Status   Specimen Description URINE, RANDOM  Final   Special Requests NONE  Final   Culture MULTIPLE SPECIES PRESENT, SUGGEST RECOLLECTION (A)  Final   Report Status 11/12/2016 FINAL  Final    Coagulation Studies: No  results for input(s): LABPROT, INR in the last 72 hours.  Urinalysis: No results for input(s): COLORURINE, LABSPEC, PHURINE, GLUCOSEU, HGBUR, BILIRUBINUR, KETONESUR, PROTEINUR, UROBILINOGEN, NITRITE, LEUKOCYTESUR in the last 72 hours.  Invalid input(s): APPERANCEUR    Imaging: Dg Chest 1 View  Result Date: 11/13/2016 CLINICAL DATA:  Shortness of breath. EXAM: CHEST 1 VIEW COMPARISON:  Radiograph of November 11, 2016. FINDINGS: Stable cardiomegaly with central pulmonary vascular congestion. No pneumothorax is noted. Mild right pleural effusion is noted with probable associated atelectasis or infiltrate. Bony thorax is unremarkable. IMPRESSION: Stable cardiomegaly with central pulmonary vascular congestion. Mild right pleural effusion is noted with probable associated atelectasis or infiltrate. Electronically Signed   By: Marijo Conception, M.D.   On: 11/13/2016 12:33     Medications:   . sodium chloride     . albuterol  2.5 mg  Nebulization BID  . aspirin EC  81 mg Oral Daily  . budesonide  0.25 mg Nebulization BID  . Chlorhexidine Gluconate Cloth  6 each Topical Q0600  . citalopram  40 mg Oral Daily  . docusate sodium  100 mg Oral BID  . enoxaparin (LOVENOX) injection  40 mg Subcutaneous BID  . hydrALAZINE  100 mg Oral Q8H  . insulin aspart  0-5 Units Subcutaneous QHS  . insulin aspart  0-9 Units Subcutaneous TID WC  . insulin aspart  3 Units Subcutaneous TID WC  . insulin glargine  23 Units Subcutaneous QHS  . metoprolol tartrate  100 mg Oral BID  . mupirocin ointment  1 application Nasal BID  . polyethylene glycol  17 g Oral Daily  . potassium chloride  20 mEq Oral BID  . senna  1 tablet Oral Daily  . sodium chloride flush  3 mL Intravenous Q12H  . torsemide  40 mg Oral Daily   sodium chloride, acetaminophen, diphenhydrAMINE, guaiFENesin-dextromethorphan, hydrALAZINE, HYDROcodone-acetaminophen, labetalol, ondansetron (ZOFRAN) IV, polyethylene glycol, sodium chloride flush  Assessment/ Plan:  46 y.o. African-American female with morbid obesity, insulin-dependent diabetes, chronic kidney disease stage III, concentric left ventricular hypertrophy, COPD, hypertension, proteinuria She presents for accelerated hypertension due to no medications for several days after she lost her insurance coverage  1. Acute kidney injury/acute renal failure 2. Chronic kidney disease stage III. Baseline creatinine 1.53/GFR 46 from 11/07/2016 3. Proteinuria, 5 g in May 2018. ANA negative in October 2016, SPEP negative for M spike 06/2016 4. Hypertension and dependent edema 5. Morbid obesity 6. Diabetes type 2 with CKD stage III  Plan:  This continues to be a challenging case. On the one hand she still needs diuresis however her renal function has started to worsen again on torsemide. Creatinine up to 2.1 and BUN up to 50. Her pulmonary pressures were elevated however. Therefore okay to continue diuresis for now. Patient is  making good amounts of urine however. Urine output over the preceding 24 hours was 3.4 L. We will continue to monitor her progress along with you for now.    LOS: 8 Reyna Lorenzi 9/21/201811:20 [place], [place]

## 2016-11-15 NOTE — Progress Notes (Signed)
[facility] [place] NAME: [name]    MR#:  [id number]  DATE OF BIRTH:  March 04, 1970  SUBJECTIVE:   Patient Had bowel movement. Breathing is improved.  REVIEW OF SYSTEMS:    Review of Systems  Constitutional: Negative for fever, chills weight loss HENT: Negative for ear pain, nosebleeds, congestion, facial swelling, rhinorrhea, neck pain, neck stiffness and ear discharge.   Respiratory: Negative for cough, Positive shortness of breath (better), no wheezing  Cardiovascular:, NO palpitations denies chest pain ++ LEE Gastrointestinal: Negative for heartburn, abdominal pain, vomiting, diarrhea Denies constipation Positive fluid in abdomen Genitourinary: Negative for dysuria, urgency, frequency, hematuria Musculoskeletal: Negative for back pain or joint pain She has soreness all over Neurological: Negative for dizziness, seizures, syncope, focal weakness,  numbness and headaches.  Hematological: Does not bruise/bleed easily.  Psychiatric/Behavioral: Negative for hallucinations, confusion, dysphoric mood    Tolerating Diet: yes      DRUG ALLERGIES:   Allergies  Allergen Reactions  . Dust Mite Extract Shortness Of Breath and Itching  . Mold Extract [Trichophyton] Shortness Of Breath and Itching  . Feraheme [Ferumoxytol] Itching  . Pollen Extract   . Sulfa Antibiotics Itching    VITALS:  Blood pressure (!) 145/86, pulse 70, temperature 98.3 F (36.8 C), temperature source Oral, resp. rate 18, height 5\' 4"  (1.626 m), weight (!) 154.3 kg (340 lb 1.6 oz), last menstrual period 09/06/2016, SpO2 97 %.  PHYSICAL EXAMINATION:  Constitutional: Appears well-developed and well-nourished. No distress. HENT: Normocephalic. [name] Oropharynx is clear and moist.  Eyes: Conjunctivae and EOM are normal. PERRLA, no scleral icterus.  Neck: Normal ROM. Neck supple. No JVD. No tracheal deviation. CVS: RRR, S1/S2 +, no murmurs, no gallops, no carotid  bruit.  Pulmonary: Effort and Diminished breath sounds throughout, no stridor, rhonchi, wheezes, rales.  Abdominal: Soft. BS +,  Positive abdominal distension/swelling, no tenderness, rebound or guarding.  Musculoskeletal: Normal range of motion.2+ LEE  and no tenderness.  Neuro: Alert. CN 2-12 grossly intact. No focal deficits. Skin: Skin is warm and dry. No rash noted. Psychiatric: Normal mood and affect.      LABORATORY PANEL:   CBC  Recent Labs Lab 11/12/16 1004  WBC 4.9  HGB 10.1*  HCT 31.1*  PLT 288   ------------------------------------------------------------------------------------------------------------------  Chemistries   Recent Labs Lab 11/15/16 0700  NA 138  K 4.8  CL 97*  CO2 33*  GLUCOSE 119*  BUN 50*  CREATININE 2.15*  CALCIUM 8.1*   ------------------------------------------------------------------------------------------------------------------  Cardiac Enzymes  Recent Labs Lab 11/08/16 1043 11/08/16 1630  TROPONINI 0.06* 0.05*   ------------------------------------------------------------------------------------------------------------------  RADIOLOGY:  Dg Chest 1 View  Result Date: 11/13/2016 CLINICAL DATA:  Shortness of breath. EXAM: CHEST 1 VIEW COMPARISON:  Radiograph of November 11, 2016. FINDINGS: Stable cardiomegaly with central pulmonary vascular congestion. No pneumothorax is noted. Mild right pleural effusion is noted with probable associated atelectasis or infiltrate. Bony thorax is unremarkable. IMPRESSION: Stable cardiomegaly with central pulmonary vascular congestion. Mild right pleural effusion is noted with probable associated atelectasis or infiltrate. Electronically Signed   By: Marijo Conception, M.D.   On: 11/13/2016 12:33     ASSESSMENT AND PLAN:   46 year old morbidly obese female who presented with shortness of breath.  1. Acute on chronic hypoxic respiratory failure in the setting of acute on chronic diastolic  heart failure due to poor adherence to daily diuretic therapy Cardiology consultation is appreciated Echocardiogram Shows normal left ventricular ejection fraction with mild  to moderate mitral regurgitation and mild diastolic dysfunction and right sided pressures are mild to moderately elevated. Continue torsemide 40 mg daily  Follow-up with cardiology as per further plans. She may need right heart catheter to better evaluate fluid status.   2. Chest pain with Elevated troponin on admission due to demand ischemia. Patient has been ruled out for ACS.  3. Hypertension:I have discontinued Norvasc as this may also play a role in patient's lower extremity edema.  Continue metoprolol and hydralazine  4. Hyperlipidemia: I have discontinue statin due to patient's complaint of body aches  5. Diabetes: A1c 7.2 With proteinuria  Continue Lantus with sliding scale and ADA diet  6. Morbid obesity: Patient will need outpatient sleep evaluation  7. Iron deficiency anemia: Hemoglobin remained stable  8. COPD with chronic respiratory failure: No signs of exacerbation 9. Acute on chronic kidney disease stage III: creatinine at 2.15 this morning.  Repeat BMP in a.m. Appreciate nephrology consultation. Follow-up with cardiology consultation in regards to diuresis.   10. Constipation: Resolved.  Physical therapy is recommended skilled nursing facility at discharge.    Management plans discussed with the patient and she is in agreement.  CODE STATUS: full  TOTAL TIME TAKING CARE OF THIS PATIENT: 23 minutes.   D/w cardiology    POSSIBLE D/C 1-2 days, DEPENDING ON CLINICAL CONDITION.   [name] M.D on 11/15/2016 at 10:35 AM  Between 7am to 6pm - Pager - [phone] After 6pm go to [email or web address] - password [email or web address] [facility]  Office  [phone]  CC: Primary care physician; [name] Berry, NP  Note: This dictation was prepared with Dragon dictation along  with smaller phrase technology. Any transcriptional errors that result from this process are unintentional.

## 2016-11-16 LAB — CBC
HCT: 30.6 % — ABNORMAL LOW (ref 35.0–47.0)
HEMOGLOBIN: 9.9 g/dL — AB (ref 12.0–16.0)
MCH: 27.7 pg (ref 26.0–34.0)
MCHC: 32.2 g/dL (ref 32.0–36.0)
MCV: 86.2 fL (ref 80.0–100.0)
Platelets: 317 10*3/uL (ref 150–440)
RBC: 3.56 MIL/uL — ABNORMAL LOW (ref 3.80–5.20)
RDW: 14.8 % — ABNORMAL HIGH (ref 11.5–14.5)
WBC: 5.2 10*3/uL (ref 3.6–11.0)

## 2016-11-16 LAB — BASIC METABOLIC PANEL
Anion gap: 6 (ref 5–15)
BUN: 48 mg/dL — ABNORMAL HIGH (ref 6–20)
CALCIUM: 8.1 mg/dL — AB (ref 8.9–10.3)
CHLORIDE: 97 mmol/L — AB (ref 101–111)
CO2: 34 mmol/L — ABNORMAL HIGH (ref 22–32)
CREATININE: 1.98 mg/dL — AB (ref 0.44–1.00)
GFR, EST AFRICAN AMERICAN: 34 mL/min — AB (ref >60)
GFR, EST NON AFRICAN AMERICAN: 29 mL/min — AB (ref >60)
Glucose, Bld: 143 mg/dL — ABNORMAL HIGH (ref 65–99)
Potassium: 5 mmol/L (ref 3.5–5.1)
SODIUM: 137 mmol/L (ref 135–145)

## 2016-11-16 LAB — GLUCOSE, CAPILLARY
GLUCOSE-CAPILLARY: 107 mg/dL — AB (ref 65–99)
GLUCOSE-CAPILLARY: 151 mg/dL — AB (ref 65–99)

## 2016-11-16 MED ORDER — ALBUTEROL SULFATE HFA 108 (90 BASE) MCG/ACT IN AERS: 2.0000 | INHALATION_SPRAY | Freq: Four times a day (QID) | RESPIRATORY_TRACT | 0 refills | Status: DC | PRN

## 2016-11-16 MED ORDER — TORSEMIDE 20 MG PO TABS: 40.0000 mg | ORAL_TABLET | Freq: Every day | ORAL | 0 refills | Status: DC

## 2016-11-16 MED ORDER — HYDRALAZINE HCL 100 MG PO TABS: 100.0000 mg | ORAL_TABLET | Freq: Three times a day (TID) | ORAL | 0 refills | Status: DC

## 2016-11-16 MED ORDER — INSULIN DEGLUDEC 100 UNIT/ML ~~LOC~~ SOPN: 46.0000 [IU] | PEN_INJECTOR | Freq: Every day | SUBCUTANEOUS | 0 refills | Status: AC

## 2016-11-16 MED ORDER — ATORVASTATIN CALCIUM 80 MG PO TABS: 80.0000 mg | ORAL_TABLET | Freq: Every day | ORAL | 0 refills | Status: DC

## 2016-11-16 MED ORDER — METOPROLOL TARTRATE 100 MG PO TABS: 100.0000 mg | ORAL_TABLET | Freq: Two times a day (BID) | ORAL | 0 refills | Status: DC

## 2016-11-16 MED ORDER — CITALOPRAM HYDROBROMIDE 40 MG PO TABS: 40.0000 mg | ORAL_TABLET | Freq: Every day | ORAL | 0 refills | Status: DC

## 2016-11-16 MED ORDER — INSULIN ASPART 100 UNIT/ML FLEXPEN: 0.0000 [IU] | PEN_INJECTOR | Freq: Three times a day (TID) | SUBCUTANEOUS | 0 refills | Status: DC | PRN

## 2016-11-16 MED ORDER — POTASSIUM CHLORIDE ER 10 MEQ PO TBCR: 10.0000 meq | EXTENDED_RELEASE_TABLET | Freq: Two times a day (BID) | ORAL | 0 refills | Status: DC

## 2016-11-16 NOTE — Progress Notes (Signed)
Pt to be discharged today. Iv and tele removed. disch instruction and several prescrips given to her. disch instructions given to pt to her understanding. Awaiting transport by family member. [facility].

## 2016-11-16 NOTE — Progress Notes (Signed)
[place], [place] 11/16/16  Subjective:  Patient seen at bedside. Creatinine down to 1.9. Still has shortness of breath with very minimal exertion.   Objective:  Vital signs in last 24 hours:  Temp:  [97 F (36.1 C)-98.3 F (36.8 C)] 97 F (36.1 C) (09/22 0900) Pulse Rate:  [61-70] 61 (09/22 0900) Resp:  [16-18] 16 (09/22 0900) BP: (135-155)/(80-94) 135/80 (09/22 0900) SpO2:  [93 %-100 %] 93 % (09/22 0925) FiO2 (%):  [32 %] 32 % (09/21 2029) Weight:  [155 kg (341 lb 11.2 oz)] 155 kg (341 lb 11.2 oz) (09/22 0511)  Weight change: 0.726 kg (1 lb 9.6 oz) Filed Weights   11/14/16 0400 11/15/16 0437 11/16/16 0511  Weight: (!) 152.4 kg (336 lb) (!) 154.3 kg (340 lb 1.6 oz) (!) 155 kg (341 lb 11.2 oz)    Intake/Output:    Intake/Output Summary (Last 24 hours) at 11/16/16 0937 Last data filed at 11/16/16 0545  Gross per 24 hour  Intake                0 ml  Output             1951 ml  Net            -1951 ml     Physical Exam: General: Morbidly obese female, sitting up in bed  HEENT Anicteric, moist oral mucous membranes   Neck No JVD   Pulm/lungs Normal breathing effort, clear to auscultation   CVS/Heart Regular, no rub or gallop   Abdomen:  Soft, nontender, obese, dependent edema over pannus   Extremities: + Edema   Neurologic: Alert, oriented, conversant  Skin: Abdominal wall edema           Basic Metabolic Panel:   Recent Labs Lab 11/12/16 0405 11/13/16 0353 11/14/16 0443 11/15/16 0700 11/16/16 0429  NA 136 138 137 138 137  K 4.1 4.3 4.4 4.8 5.0  CL 94* 95* 96* 97* 97*  CO2 35* 35* 35* 33* 34*  GLUCOSE 216* 124* 126* 119* 143*  BUN 44* 44* 48* 50* 48*  CREATININE 2.25* 2.09* 2.02* 2.15* 1.98*  CALCIUM 8.0* 8.0* 8.1* 8.1* 8.1*     CBC:  Recent Labs Lab 11/12/16 1004 11/16/16 0429  WBC 4.9 5.2  HGB 10.1* 9.9*  HCT 31.1* 30.6*  MCV 85.3 86.2  PLT 288 317     No results found for: HEPBSAG, HEPBSAB,  HEPBIGM    Microbiology:  Recent Results (from the past 240 hour(s))  MRSA PCR Screening     Status: Abnormal   Collection Time: 11/08/16  4:57 AM  Result Value Ref Range Status   MRSA by PCR POSITIVE (A) NEGATIVE Final    Comment:        The GeneXpert MRSA Assay (FDA approved for NASAL specimens only), is one component of a comprehensive MRSA colonization surveillance program. It is not intended to diagnose MRSA infection nor to guide or monitor treatment for MRSA infections. RESULT CALLED TO, READ BACK BY AND VERIFIED WITH:  RENEE BABB AT 1937 11/08/16 SDR   Urine Culture     Status: Abnormal   Collection Time: 11/10/16  1:09 PM  Result Value Ref Range Status   Specimen Description URINE, RANDOM  Final   Special Requests NONE  Final   Culture MULTIPLE SPECIES PRESENT, SUGGEST RECOLLECTION (A)  Final   Report Status 11/12/2016 FINAL  Final    Coagulation Studies: No results for input(s): LABPROT, INR in the  last 72 hours.  Urinalysis: No results for input(s): COLORURINE, LABSPEC, PHURINE, GLUCOSEU, HGBUR, BILIRUBINUR, KETONESUR, PROTEINUR, UROBILINOGEN, NITRITE, LEUKOCYTESUR in the last 72 hours.  Invalid input(s): APPERANCEUR    Imaging: No results found.   Medications:   . sodium chloride     . albuterol  2.5 mg Nebulization BID  . aspirin EC  81 mg Oral Daily  . budesonide  0.25 mg Nebulization BID  . citalopram  40 mg Oral Daily  . docusate sodium  100 mg Oral BID  . heparin subcutaneous  5,000 Units Subcutaneous Q8H  . hydrALAZINE  100 mg Oral Q8H  . insulin aspart  0-5 Units Subcutaneous QHS  . insulin aspart  0-9 Units Subcutaneous TID WC  . insulin aspart  3 Units Subcutaneous TID WC  . insulin glargine  23 Units Subcutaneous QHS  . metoprolol tartrate  100 mg Oral BID  . polyethylene glycol  17 g Oral Daily  . potassium chloride  20 mEq Oral BID  . senna  1 tablet Oral Daily  . sodium chloride flush  3 mL Intravenous Q12H  . torsemide  40 mg  Oral Daily   sodium chloride, acetaminophen, diphenhydrAMINE, guaiFENesin-dextromethorphan, hydrALAZINE, HYDROcodone-acetaminophen, labetalol, ondansetron (ZOFRAN) IV, polyethylene glycol, sodium chloride flush  Assessment/ Plan:  46 y.o. African-American female with morbid obesity, insulin-dependent diabetes, chronic kidney disease stage III, concentric left ventricular hypertrophy, COPD, hypertension, proteinuria She presents for accelerated hypertension due to no medications for several days after she lost her insurance coverage  1. Acute kidney injury/acute renal failure 2. Chronic kidney disease stage III. Baseline creatinine 1.53/GFR 46 from 11/07/2016 3. Proteinuria, 5 g in May 2018. ANA negative in October 2016, SPEP negative for M spike 06/2016 4. Hypertension and dependent edema 5. Morbid obesity 6. Diabetes type 2 with CKD stage III  Plan:  The patient's renal function has improved a bit. Creatinine currently 1.98 with a BUN of 48. She will need close outpatient follow-up for her underlying chronic kidney disease and recent acute renal failure. We are okay with maintaining the patient on current dosage of torsemide. As before this continues to be a challenging case as she continues to have total body volume overload but is sensitive to intravascular volume depletion as evidenced by her renal function. Overall patient has a guarded prognosis. We will set up follow-up in our office.    LOS: 9 [name] 9/22/20189:37 [place], [address]

## 2016-11-16 NOTE — Care Management Note (Addendum)
Case Management Note  Patient Details  Name: [name] MRN: [id number] Date of Birth: 1970/07/04  Subjective/Objective:     Qualifying information for [facility] for uninsured [name] was obtained from her and provided to Melene Muller at [facility]. A referral for HH=PT, RN, Aide, SW and a request for a bariatric BSC was called to [place] at [facility]. A bariatric RW will be delivered to [name] today in her [facility] hospital room prior to discharge. [name] reports that she already has a RW at home.    [name] was provided with a [facility] coupon for new medications.             Action/Plan:   Expected Discharge Date:  11/16/16               Expected Discharge Plan:  [place]  In-House Referral:     Discharge planning Services  CM Consult, [facility], Medication Assistance, [place], Other - See comment [facility] DME and [facility] services. Qualifying charity information called to Melene Muller at [facility]. )  Post Acute Care Choice:  Durable Medical Equipment, Home Health Choice offered to:  Patient  DME Arranged:  Bedside commode DME Agency:  [place]:  RN, PT, Nurse's Aide, Social Work [employer] Agency:  [place]  Status of Service:  Completed, signed off  If discussed at [employer], dates discussed:    Additional Comments:  Chenee Munns A, RN 11/16/2016, 11:11 AM

## 2016-11-16 NOTE — Discharge Summary (Signed)
[facility] [place] NAME: [name]    MR#:  [id number]  DATE OF BIRTH:  1970-11-14  DATE OF ADMISSION:  11/07/2016 ADMITTING PHYSICIAN: Harvie Bridge, DO  DATE OF DISCHARGE: 11/16/2016  PRIMARY CARE PHYSICIAN: [name], NP    ADMISSION DIAGNOSIS:  Chest pain [R07.9] Acute congestive heart failure, unspecified heart failure type ([place]) [I50.9]  DISCHARGE DIAGNOSIS:  Active Problems:   Acute exacerbation of CHF (congestive heart failure) ([place])   SECONDARY DIAGNOSIS:   Past Medical History:  Diagnosis Date  . (HFpEF) heart failure with preserved ejection fraction ([place])    a. 11/2014 Echo: EF 50-55%, Gr1 DD; b. 06/2016 Echo: EF 60-65%, no rwma, mod dil LA, nl RV, triv eff.  . Asthma   . CKD (chronic kidney disease), stage III   . COPD (chronic obstructive pulmonary disease) ([place])    a. Home O2.  . Diabetes mellitus without complication ([place])   . Hypertension   . Iron deficiency anemia    a. chronic blood loss->heavy menses.  . Morbid obesity ([place])   . Pericardial effusion    a. 11/2014 CT Chest: Mod effusion; b. 11/2014 Echo: small to mod circumferential effusion. No hemodynamic compromise.  [name] Poorly controlled diabetes mellitus ([place])    a. 11/2014 A1c 13.2.    HOSPITAL COURSE:   46 year old morbidly obese female who presented with shortness of breath.  1. Acute on chronic hypoxic respiratory failure in the setting of acute on chronic diastolic heart failure due to poor adherence to daily diuretic therapy Patient was seen and evaluated by cardiology during her hospital stay.  Echocardiogram Shows normal left ventricular ejection fraction with mild to moderate mitral regurgitation and mild diastolic dysfunction and right sided pressures are mild to moderately elevated. She has responded well to  torsemide 40 mg daily. She has diuresed about 13 L since admission. She will follow-up with CHF clinic at discharge as  well as cardiology. She is a challenging case that she continues to have total body volume overload but is sensitive to intravascular volume depletion as evidenced by her renal function. She will need close monitoring of her BMP.   2. Chest pain with Elevated troponin on admission due to demand ischemia. Patient has been ruled out for ACS.  3. Hypertension:I have discontinued Norvasc as this may also play a role in patient's lower extremity edema.  Continue metoprolol and hydralazine  4. Hyperlipidemia: I have discontinue statin due to patient's complaint of body aches  5. Diabetes: A1c 7.2 With proteinuria  Continue Lantus  and ADA diet  6. Morbid obesity: Patient will need outpatient sleep evaluation which can be ordered by her primary care physician  7. Iron deficiency anemia: Hemoglobin has remained stable  8. COPD with chronic respiratory failure on 3-4 L of oxygen: No signs of exacerbation  9. Acute on chronic kidney disease stage III: creatinine has improved to 1.98. She is very sensitive to intravascular volume depletion. She will need close monitoring of her BMP. She will follow up with nephrology in the next few days as well as cardiology.  10. Constipation: Resolved.   DISCHARGE CONDITIONS AND DIET:   Able for discharge on heart healthy diabetic diet  CONSULTS OBTAINED:  Treatment Team:  Minna Merritts, MD Murlean Iba, MD  DRUG ALLERGIES:   Allergies  Allergen Reactions  . Dust Mite Extract Shortness Of Breath and Itching  . Mold Extract [Trichophyton] Shortness Of Breath and Itching  .  Feraheme [Ferumoxytol] Itching  . Pollen Extract   . Sulfa Antibiotics Itching    DISCHARGE MEDICATIONS:   Current Discharge Medication List    CONTINUE these medications which have CHANGED   Details  albuterol (PROVENTIL HFA;VENTOLIN HFA) 108 (90 Base) MCG/ACT inhaler Inhale 2 puffs into the lungs every 6 (six) hours as needed for wheezing or shortness of  breath. Qty: 3 Inhaler, Refills: 0    atorvastatin (LIPITOR) 80 MG tablet Take 1 tablet (80 mg total) by mouth daily at 6 PM. Qty: 30 tablet, Refills: 0    citalopram (CELEXA) 40 MG tablet Take 1 tablet (40 mg total) by mouth daily. Qty: 30 tablet, Refills: 0    hydrALAZINE (APRESOLINE) 100 MG tablet Take 1 tablet (100 mg total) by mouth every 8 (eight) hours. Qty: 90 tablet, Refills: 0    insulin aspart (NOVOLOG FLEXPEN) 100 UNIT/ML FlexPen Inject 0-12 Units into the skin 3 (three) times daily with meals as needed for high blood sugar. Pt uses as needed per sliding scale. Qty: 15 mL, Refills: 0    insulin degludec (TRESIBA FLEXTOUCH) 100 UNIT/ML SOPN FlexTouch Pen Inject 0.46 mLs (46 Units total) into the skin at bedtime. Qty: 13.8 mL, Refills: 0    metoprolol tartrate (LOPRESSOR) 100 MG tablet Take 1 tablet (100 mg total) by mouth 2 (two) times daily. Qty: 60 tablet, Refills: 0    potassium chloride (K-DUR) 10 MEQ tablet Take 1 tablet (10 mEq total) by mouth 2 (two) times daily. Qty: 60 tablet, Refills: 0    torsemide (DEMADEX) 20 MG tablet Take 2 tablets (40 mg total) by mouth daily. Qty: 60 tablet, Refills: 0      CONTINUE these medications which have NOT CHANGED   Details  acetaminophen (TYLENOL) 500 MG tablet Take 1,000 mg by mouth every 6 (six) hours as needed for mild pain, fever or headache.    albuterol (PROVENTIL) (2.5 MG/3ML) 0.083% nebulizer solution Take 2.5 mg by nebulization every 6 (six) hours as needed for wheezing or shortness of breath.    aspirin EC 81 MG tablet Take 81 mg by mouth daily.     budesonide (PULMICORT) 0.25 MG/2ML nebulizer solution Take 2 mLs (0.25 mg total) by nebulization 2 (two) times daily. Qty: 60 mL, Refills: 12    Cholecalciferol (HM VITAMIN D3) 4000 units CAPS Take 1 capsule by mouth once a week.    polyethylene glycol (MIRALAX / GLYCOLAX) packet Take 17 g by mouth daily as needed for mild constipation. Qty: 14 each, Refills: 0       STOP taking these medications     amLODipine (NORVASC) 10 MG tablet      guaiFENesin-dextromethorphan (ROBITUSSIN DM) 100-10 MG/5ML syrup      losartan (COZAAR) 50 MG tablet      potassium chloride (K-DUR,KLOR-CON) 10 MEQ tablet      simvastatin (ZOCOR) 40 MG tablet           Today   CHIEF COMPLAINT:   No acute issues overnight. Patient does show some improvement. She had bowel movement.   VITAL SIGNS:  Blood pressure 135/80, pulse 61, temperature (!) 97 F (36.1 C), temperature source Axillary, resp. rate 16, height 5\' 4"  (1.626 m), weight (!) 155 kg (341 lb 11.2 oz), last menstrual period 09/06/2016, SpO2 93 %.   REVIEW OF SYSTEMS:  Review of Systems  Constitutional: Negative.  Negative for chills, fever and malaise/fatigue.  HENT: Negative.  Negative for ear discharge, ear pain, hearing loss, nosebleeds and sore  throat.   Eyes: Negative.  Negative for blurred vision and pain.  Respiratory: Negative.  Negative for cough, hemoptysis, shortness of breath and wheezing.   Cardiovascular: Positive for leg swelling. Negative for chest pain and palpitations.  Gastrointestinal: Negative.  Negative for abdominal pain, blood in stool, diarrhea, nausea and vomiting.  Genitourinary: Negative.  Negative for dysuria.  Musculoskeletal: Negative.  Negative for back pain.  Skin: Negative.   Neurological: Negative for dizziness, tremors, speech change, focal weakness, seizures and headaches.  Endo/Heme/Allergies: Negative.  Does not bruise/bleed easily.  Psychiatric/Behavioral: Negative.  Negative for depression, hallucinations and suicidal ideas.     PHYSICAL EXAMINATION:  GENERAL:  46 y.o.-year-old patient Sitting in the bed with no acute distress. Morbidly obese NECK:  Supple, no jugular venous distention. No thyroid enlargement, no tenderness.  LUNGS: Normal breath sounds bilaterally, no wheezing, rales,rhonchi  No use of accessory muscles of respiration.  CARDIOVASCULAR:  S1, S2 normal. No murmurs, rubs, or gallops.  ABDOMEN: Soft, non-tender, non-distended. Bowel sounds present. No organomegaly or mass.  EXTREMITIES: 1+ LEE NO cyanosis, or clubbing.  PSYCHIATRIC: The patient is alert and oriented x 3.  SKIN: No obvious rash, lesion, or ulcer.   DATA REVIEW:   CBC  Recent Labs Lab 11/16/16 0429  WBC 5.2  HGB 9.9*  HCT 30.6*  PLT 317    Chemistries   Recent Labs Lab 11/16/16 0429  NA 137  K 5.0  CL 97*  CO2 34*  GLUCOSE 143*  BUN 48*  CREATININE 1.98*  CALCIUM 8.1*    Cardiac Enzymes No results for input(s): TROPONINI in the last 168 hours.  Microbiology Results  @MICRORSLT48 @  RADIOLOGY:  No results found.    Current Discharge Medication List    CONTINUE these medications which have CHANGED   Details  albuterol (PROVENTIL HFA;VENTOLIN HFA) 108 (90 Base) MCG/ACT inhaler Inhale 2 puffs into the lungs every 6 (six) hours as needed for wheezing or shortness of breath. Qty: 3 Inhaler, Refills: 0    atorvastatin (LIPITOR) 80 MG tablet Take 1 tablet (80 mg total) by mouth daily at 6 PM. Qty: 30 tablet, Refills: 0    citalopram (CELEXA) 40 MG tablet Take 1 tablet (40 mg total) by mouth daily. Qty: 30 tablet, Refills: 0    hydrALAZINE (APRESOLINE) 100 MG tablet Take 1 tablet (100 mg total) by mouth every 8 (eight) hours. Qty: 90 tablet, Refills: 0    insulin aspart (NOVOLOG FLEXPEN) 100 UNIT/ML FlexPen Inject 0-12 Units into the skin 3 (three) times daily with meals as needed for high blood sugar. Pt uses as needed per sliding scale. Qty: 15 mL, Refills: 0    insulin degludec (TRESIBA FLEXTOUCH) 100 UNIT/ML SOPN FlexTouch Pen Inject 0.46 mLs (46 Units total) into the skin at bedtime. Qty: 13.8 mL, Refills: 0    metoprolol tartrate (LOPRESSOR) 100 MG tablet Take 1 tablet (100 mg total) by mouth 2 (two) times daily. Qty: 60 tablet, Refills: 0    potassium chloride (K-DUR) 10 MEQ tablet Take 1 tablet (10 mEq total) by mouth 2  (two) times daily. Qty: 60 tablet, Refills: 0    torsemide (DEMADEX) 20 MG tablet Take 2 tablets (40 mg total) by mouth daily. Qty: 60 tablet, Refills: 0      CONTINUE these medications which have NOT CHANGED   Details  acetaminophen (TYLENOL) 500 MG tablet Take 1,000 mg by mouth every 6 (six) hours as needed for mild pain, fever or headache.    albuterol (PROVENTIL) (  2.5 MG/3ML) 0.083% nebulizer solution Take 2.5 mg by nebulization every 6 (six) hours as needed for wheezing or shortness of breath.    aspirin EC 81 MG tablet Take 81 mg by mouth daily.     budesonide (PULMICORT) 0.25 MG/2ML nebulizer solution Take 2 mLs (0.25 mg total) by nebulization 2 (two) times daily. Qty: 60 mL, Refills: 12    Cholecalciferol (HM VITAMIN D3) 4000 units CAPS Take 1 capsule by mouth once a week.    polyethylene glycol (MIRALAX / GLYCOLAX) packet Take 17 g by mouth daily as needed for mild constipation. Qty: 14 each, Refills: 0      STOP taking these medications     amLODipine (NORVASC) 10 MG tablet      guaiFENesin-dextromethorphan (ROBITUSSIN DM) 100-10 MG/5ML syrup      losartan (COZAAR) 50 MG tablet      potassium chloride (K-DUR,KLOR-CON) 10 MEQ tablet      simvastatin (ZOCOR) 40 MG tablet          A Management plans discussed with the patient and she is in agreement. Stable for discharge   Patient should follow up with pcp and cariology  CODE STATUS:     Code Status Orders        Start     Ordered   11/08/16 0451  Full code  Continuous     11/08/16 0450    Code Status History    Date Active Date Inactive Code Status Order ID Comments User Context   06/25/2016  2:47 PM 06/29/2016  6:15 PM Full Code [id number]  Hillary Bow, MD ED   08/09/2015  5:57 PM 08/13/2015  5:21 PM Full Code [id number]  Bettey Costa, MD Inpatient   12/31/2014  8:25 PM 01/03/2015  5:49 PM Full Code [id number]  Lytle Butte, MD ED   12/15/2014 11:54 AM 12/19/2014  4:57 PM Full Code [id number]  Hillary Bow, MD ED      TOTAL TIME TAKING CARE OF THIS PATIENT: 37 minutes.    Note: This dictation was prepared with Dragon dictation along with smaller phrase technology. Any transcriptional errors that result from this process are unintentional.  Carolan Avedisian M.D on 11/16/2016 at 10:27 AM  Between 7am to 6pm - Pager - [phone] After 6pm go to [email or web address] - [email or web address] [facility]  Office  [phone]  CC: Primary care physician; [name], NP

## 2016-11-19 ENCOUNTER — Telehealth: Payer: Self-pay | Admitting: Family

## 2016-11-19 NOTE — Telephone Encounter (Signed)
Patient called regarding her appointment that was scheduled for tomorrow (11/20/16). She says that she's currently uninsured and has no means to pay tomorrow. She says that she has paperwork for [facility] and someone is helping her with the charity care program. She has also applied for adult medicaid.  Reminded patient that we could still see her even without insurance as we do not collect any monies at the time of the visit. She would get billed for the visit and if she was approved for charity care, whatever coverage she was approved for, would get applied here.  She says that she would rather cancel her appointment for tomorrow and says that she'll call back to reschedule.

## 2016-11-20 ENCOUNTER — Other Ambulatory Visit: Payer: Self-pay | Admitting: *Deleted

## 2016-11-20 ENCOUNTER — Ambulatory Visit: Payer: Self-pay | Admitting: Family

## 2016-11-20 DIAGNOSIS — D649 Anemia, unspecified: Secondary | ICD-10-CM

## 2016-11-20 NOTE — Progress Notes (Deleted)
[place]  [phone] [phone]  ID: [name] OB: 12-13-1970  MR#: [id number]  FFM#:[id number]  Patient Care Team: [name], NP as PCP - General (Nurse Practitioner) Minna Merritts, MD as Consulting Physician (Cardiology) Alisa Graff, FNP as Nurse Practitioner (Family Medicine) Laverle Hobby, MD as Consulting Physician (Pulmonary Disease)  CHIEF COMPLAINT: Iron deficiency anemia.  INTERVAL HISTORY: Patient returns to clinic today for repeat laboratory work and further evaluation. She has chronic weakness and fatigue, that is essentially unchanged. Her blood pressure is significantly elevated, but she has no neurologic complaints. She states her blood pressure medication does not make her feel well and she has not taken it in several days. She also continues to have chronic shortness of breath and requires oxygen 24 hours per day.  She denies any fevers. She has no chest pain, cough, or hemoptysis. She denies any nausea, vomiting, consultation, or diarrhea. She has no melena or hematochezia. Patient otherwise feels well and offers no further specific complaints.  REVIEW OF SYSTEMS:   Review of Systems  Constitutional: Positive for malaise/fatigue. Negative for fever and weight loss.  Respiratory: Positive for shortness of breath. Negative for hemoptysis and sputum production.   Cardiovascular: Negative.  Negative for chest pain.  Gastrointestinal: Negative.  Negative for abdominal pain, blood in stool and melena.  Genitourinary: Negative.   Musculoskeletal: Negative.   Neurological: Positive for weakness.  Psychiatric/Behavioral: Negative.  The patient is not nervous/anxious.     As per HPI. Otherwise, a complete review of systems is negative.  PAST MEDICAL HISTORY: Past Medical History:  Diagnosis Date  . (HFpEF) heart failure with preserved ejection fraction ([place])    a. 11/2014 Echo: EF 50-55%, Gr1 DD; b.  06/2016 Echo: EF 60-65%, no rwma, mod dil LA, nl RV, triv eff.  . Asthma   . CKD (chronic kidney disease), stage III   . COPD (chronic obstructive pulmonary disease) ([place])    a. Home O2.  . Diabetes mellitus without complication ([place])   . Hypertension   . Iron deficiency anemia    a. chronic blood loss->heavy menses.  . Morbid obesity ([place])   . Pericardial effusion    a. 11/2014 CT Chest: Mod effusion; b. 11/2014 Echo: small to mod circumferential effusion. No hemodynamic compromise.  [name] Poorly controlled diabetes mellitus ([place])    a. 11/2014 A1c 13.2.    PAST SURGICAL HISTORY: Past Surgical History:  Procedure Laterality Date  . CESAREAN SECTION     times 3    FAMILY HISTORY Family History  Problem Relation Age of Onset  . Diabetes Mother   . Hypertension Mother   . Hypertension Father   . Diabetes Father   . Hypertension Unknown   . Diabetes Unknown   . Breast cancer Maternal Aunt 54  . Breast cancer Maternal Aunt 64       ADVANCED DIRECTIVES:    HEALTH MAINTENANCE: Social History  Substance Use Topics  . Smoking status: Never Smoker  . Smokeless tobacco: Never Used  . Alcohol use 0.0 oz/week     Comment: occ drink - <1/wk     Colonoscopy:  PAP:  Bone density:  Lipid panel:  Allergies  Allergen Reactions  . Dust Mite Extract Shortness Of Breath and Itching  . Mold Extract [Trichophyton] Shortness Of Breath and Itching  . Feraheme [Ferumoxytol] Itching  . Pollen Extract   . Sulfa Antibiotics Itching    Current Outpatient Prescriptions  Medication Sig Dispense Refill  .  acetaminophen (TYLENOL) 500 MG tablet Take 1,000 mg by mouth every 6 (six) hours as needed for mild pain, fever or headache.    . albuterol (PROVENTIL HFA;VENTOLIN HFA) 108 (90 Base) MCG/ACT inhaler Inhale 2 puffs into the lungs every 6 (six) hours as needed for wheezing or shortness of breath. 3 Inhaler 0  . albuterol (PROVENTIL) (2.5 MG/3ML) 0.083% nebulizer solution Take 2.5 mg by  nebulization every 6 (six) hours as needed for wheezing or shortness of breath.    [name] aspirin EC 81 MG tablet Take 81 mg by mouth daily.     [name] atorvastatin (LIPITOR) 80 MG tablet Take 1 tablet (80 mg total) by mouth daily at 6 PM. 30 tablet 0  . budesonide (PULMICORT) 0.25 MG/2ML nebulizer solution Take 2 mLs (0.25 mg total) by nebulization 2 (two) times daily. 60 mL 12  . Cholecalciferol (HM VITAMIN D3) 4000 units CAPS Take 1 capsule by mouth once a week.    . citalopram (CELEXA) 40 MG tablet Take 1 tablet (40 mg total) by mouth daily. 30 tablet 0  . hydrALAZINE (APRESOLINE) 100 MG tablet Take 1 tablet (100 mg total) by mouth every 8 (eight) hours. 90 tablet 0  . insulin aspart (NOVOLOG FLEXPEN) 100 UNIT/ML FlexPen Inject 0-12 Units into the skin 3 (three) times daily with meals as needed for high blood sugar. Pt uses as needed per sliding scale. 15 mL 0  . insulin degludec (TRESIBA FLEXTOUCH) 100 UNIT/ML SOPN FlexTouch Pen Inject 0.46 mLs (46 Units total) into the skin at bedtime. 13.8 mL 0  . metoprolol tartrate (LOPRESSOR) 100 MG tablet Take 1 tablet (100 mg total) by mouth 2 (two) times daily. 60 tablet 0  . polyethylene glycol (MIRALAX / GLYCOLAX) packet Take 17 g by mouth daily as needed for mild constipation. 14 each 0  . potassium chloride (K-DUR) 10 MEQ tablet Take 1 tablet (10 mEq total) by mouth 2 (two) times daily. 60 tablet 0  . torsemide (DEMADEX) 20 MG tablet Take 2 tablets (40 mg total) by mouth daily. 60 tablet 0   No current facility-administered medications for this visit.     OBJECTIVE: There were no vitals filed for this visit.   There is no height or weight on file to calculate BMI.    ECOG FS:1 - Symptomatic but completely ambulatory  General: Well-developed, well-nourished, no acute distress. Eyes: Pink conjunctiva, anicteric sclera. Lungs: Clear to auscultation bilaterally. Heart: Regular rate and rhythm. No rubs, murmurs, or gallops. Abdomen: Soft, nontender,  nondistended. No organomegaly noted, normoactive bowel sounds. Musculoskeletal: No edema, cyanosis, or clubbing. Neuro: Alert, answering all questions appropriately. Cranial nerves grossly intact. Skin: No rashes or petechiae noted. Psych: Normal affect.   LAB RESULTS:  Lab Results  Component Value Date   NA 137 11/16/2016   K 5.0 11/16/2016   CL 97 (L) 11/16/2016   CO2 34 (H) 11/16/2016   GLUCOSE 143 (H) 11/16/2016   BUN 48 (H) 11/16/2016   CREATININE 1.98 (H) 11/16/2016   CALCIUM 8.1 (L) 11/16/2016   PROT 6.1 (L) 03/23/2016   ALBUMIN 2.7 (L) 06/27/2016   AST 19 03/23/2016   ALT 12 (L) 03/23/2016   ALKPHOS 118 03/23/2016   BILITOT 0.7 03/23/2016   GFRNONAA 29 (L) 11/16/2016   GFRAA 34 (L) 11/16/2016    Lab Results  Component Value Date   WBC 5.2 11/16/2016   NEUTROABS 4.5 08/22/2016   HGB 9.9 (L) 11/16/2016   HCT 30.6 (L) 11/16/2016   MCV 86.2  11/16/2016   PLT 317 11/16/2016   Lab Results  Component Value Date   IRON 50 08/22/2016   TIBC 290 08/22/2016   IRONPCTSAT 17 08/22/2016    Lab Results  Component Value Date   FERRITIN 40 08/22/2016     STUDIES: Dg Chest 1 View  Result Date: 11/13/2016 CLINICAL DATA:  Shortness of breath. EXAM: CHEST 1 VIEW COMPARISON:  Radiograph of November 11, 2016. FINDINGS: Stable cardiomegaly with central pulmonary vascular congestion. No pneumothorax is noted. Mild right pleural effusion is noted with probable associated atelectasis or infiltrate. Bony thorax is unremarkable. IMPRESSION: Stable cardiomegaly with central pulmonary vascular congestion. Mild right pleural effusion is noted with probable associated atelectasis or infiltrate. Electronically Signed   By: Marijo Conception, M.D.   On: 11/13/2016 12:33   Dg Chest 1 View  Result Date: 11/11/2016 CLINICAL DATA:  Acute exacerbation of congestive heart failure. EXAM: CHEST 1 VIEW COMPARISON:  Radiographs of November 07, 2016. FINDINGS: Stable cardiomegaly and central  pulmonary vascular congestion is noted. No pneumothorax is noted. Stable elevated right hemidiaphragm is noted. Bony thorax is unremarkable. Probable small right pleural effusion is noted. IMPRESSION: Stable cardiomegaly and central pulmonary vascular congestion. Probable small right pleural effusion. Electronically Signed   By: Marijo Conception, M.D.   On: 11/11/2016 11:50   Dg Chest Port 1 View  Result Date: 11/07/2016 CLINICAL DATA:  Complaint of central chest pain radiating to the left side of the chest and back x2 days. EXAM: PORTABLE CHEST 1 VIEW COMPARISON:  06/25/2016 FINDINGS: Low lung volumes with cardiomegaly, pulmonary vascular congestion and right effusion. No acute osseous abnormality. IMPRESSION: Low lung volumes are again noted with stable cardiomegaly and mild vascular congestion consistent CHF. Probable small right effusion. Electronically Signed   By: Ashley Royalty M.D.   On: 11/07/2016 20:35    ASSESSMENT: Iron deficiency anemia.  PLAN:    1. Iron deficiency anemia: Likely secondary to heavy menses. [name] hemoglobin is decreased, but her iron stores are now within normal limits. She does not require IV Feraheme today. Previously, patient had a mild allergic reaction to Feraheme therefore in addition to her treatment she will receive 20 mg of dexamethasone, 25 mg Benadryl, 50 mg ranitidine, and 650 mg of Tylenol.  No intervention is needed at this time. Return to clinic in 3 months for repeat laboratory work and further evaluation. 2. Shortness of Breath: Continue oxygen as prescribed. 3. Hypertension: [name] blood pressure is definitely elevated today. Patient was encouraged to take her blood pressure medications as prescribed. Treatment per PCP.  Patient expressed understanding and was in agreement with this plan. She also understands that She can call clinic at any time with any questions, concerns, or complaints.    Lloyd Huger, MD   11/20/2016 11:16 PM

## 2016-11-21 ENCOUNTER — Inpatient Hospital Stay: Payer: MEDICAID

## 2016-11-21 ENCOUNTER — Inpatient Hospital Stay: Payer: MEDICAID | Admitting: Oncology

## 2016-11-25 NOTE — Progress Notes (Signed)
Notified this morning that [name] attempted to use her [employer] [facility] letter yesterday, 9/30, and was advised that it had expired. I have extended it through 10/3 and notified [name] that it should go through now.

## 2016-12-23 ENCOUNTER — Telehealth: Payer: Self-pay | Admitting: Family

## 2016-12-23 ENCOUNTER — Ambulatory Visit: Payer: Medicaid Other | Admitting: Family

## 2016-12-23 NOTE — Telephone Encounter (Signed)
Patient did not show for her Heart Failure Clinic appointment on 12/23/16. Will attempt to reschedule.

## 2016-12-25 ENCOUNTER — Telehealth: Payer: Self-pay | Admitting: Internal Medicine

## 2016-12-25 DIAGNOSIS — J449 Chronic obstructive pulmonary disease, unspecified: Secondary | ICD-10-CM

## 2016-12-25 NOTE — Telephone Encounter (Signed)
Order for humidifier has been submitted. Needs to be signed by MD. [name] has been notified. Nothing further needed.

## 2016-12-25 NOTE — Telephone Encounter (Signed)
PT has oxygen and lately it has been drying nose out  Would like to know if they can be approved for a humidifier to help Please call to advise

## 2016-12-31 ENCOUNTER — Telehealth: Payer: Self-pay | Admitting: Cardiovascular Disease

## 2016-12-31 NOTE — Telephone Encounter (Signed)
Home health nurse Sherri called  Stating she is new to patient  And was looking in the chart of patient Noticed on 11/29/16 to today  Pt has a weight gain of 9-10 pounds She states it is hard to know exactly  For patient was not weighting herself daily  Would like some info on this Please call

## 2016-12-31 NOTE — Telephone Encounter (Signed)
Sherri RN with Home health in to see patient and she had a weight gain from 319 to 328 pounds. She reports that patient is not following diet and has not kept her appointments. She provided the patient with phone number to reschedule appointment with HF clinic and encouraged her to call and schedule with them. Reviewed with her that previous weights here were 318 and 331. She just wanted [place] to know that patient has not been compliant and she encouraged her to schedule appointment.

## 2017-01-01 NOTE — Telephone Encounter (Signed)
Can our office call to verify what her diuretic regiment is Talk to her about her fluid intake, See if she is having trouble getting her diuretics secondary to financial reasons Issue part of medical management?  Could they help with medications if needed Can we help get her set up in [facility] [place] [facility]

## 2017-01-02 NOTE — Telephone Encounter (Signed)
Spoke with home health nurse and she did look at her medications and she is taking her torsemide 40 mg once daily. She states that they have educated her about fluid and sodium intake along with daily weights and keeping appointments. Patient is currently receiving assistance with medications and her healthcare. Social worker has been out to see her as well. Home health nurse goes out to see her again soon and will make sure she keeps upcoming appointment with HF clinic.

## 2017-01-07 ENCOUNTER — Emergency Department: Payer: Medicaid Other

## 2017-01-07 ENCOUNTER — Encounter: Payer: Self-pay | Admitting: Family

## 2017-01-07 ENCOUNTER — Other Ambulatory Visit: Payer: Self-pay

## 2017-01-07 ENCOUNTER — Inpatient Hospital Stay
Admission: EM | Admit: 2017-01-07 | Discharge: 2017-01-10 | DRG: 193 | Disposition: A | Discharge status: Home Health | Payer: Medicaid Other | Attending: Internal Medicine | Admitting: Internal Medicine

## 2017-01-07 ENCOUNTER — Encounter: Payer: Self-pay | Admitting: Emergency Medicine

## 2017-01-07 ENCOUNTER — Ambulatory Visit: Payer: Medicaid Other | Admitting: Family

## 2017-01-07 VITALS — BP 160/90 | HR 107 | Resp 18 | Ht 64.0 in | Wt 328.0 lb

## 2017-01-07 DIAGNOSIS — E1122 Type 2 diabetes mellitus with diabetic chronic kidney disease: Secondary | ICD-10-CM | POA: Diagnosis present

## 2017-01-07 DIAGNOSIS — R0689 Other abnormalities of breathing: Secondary | ICD-10-CM | POA: Diagnosis present

## 2017-01-07 DIAGNOSIS — Z794 Long term (current) use of insulin: Secondary | ICD-10-CM

## 2017-01-07 DIAGNOSIS — I5032 Chronic diastolic (congestive) heart failure: Secondary | ICD-10-CM | POA: Insufficient documentation

## 2017-01-07 DIAGNOSIS — J449 Chronic obstructive pulmonary disease, unspecified: Secondary | ICD-10-CM

## 2017-01-07 DIAGNOSIS — D649 Anemia, unspecified: Secondary | ICD-10-CM

## 2017-01-07 DIAGNOSIS — J189 Pneumonia, unspecified organism: Secondary | ICD-10-CM | POA: Diagnosis present

## 2017-01-07 DIAGNOSIS — I130 Hypertensive heart and chronic kidney disease with heart failure and stage 1 through stage 4 chronic kidney disease, or unspecified chronic kidney disease: Secondary | ICD-10-CM | POA: Insufficient documentation

## 2017-01-07 DIAGNOSIS — Z9981 Dependence on supplemental oxygen: Secondary | ICD-10-CM

## 2017-01-07 DIAGNOSIS — D631 Anemia in chronic kidney disease: Secondary | ICD-10-CM | POA: Diagnosis present

## 2017-01-07 DIAGNOSIS — Z833 Family history of diabetes mellitus: Secondary | ICD-10-CM | POA: Insufficient documentation

## 2017-01-07 DIAGNOSIS — Z79899 Other long term (current) drug therapy: Secondary | ICD-10-CM | POA: Insufficient documentation

## 2017-01-07 DIAGNOSIS — I509 Heart failure, unspecified: Secondary | ICD-10-CM

## 2017-01-07 DIAGNOSIS — I10 Essential (primary) hypertension: Secondary | ICD-10-CM

## 2017-01-07 DIAGNOSIS — M791 Myalgia, unspecified site: Secondary | ICD-10-CM

## 2017-01-07 DIAGNOSIS — Z6841 Body Mass Index (BMI) 40.0 and over, adult: Secondary | ICD-10-CM

## 2017-01-07 DIAGNOSIS — J9621 Acute and chronic respiratory failure with hypoxia: Secondary | ICD-10-CM | POA: Diagnosis present

## 2017-01-07 DIAGNOSIS — J440 Chronic obstructive pulmonary disease with acute lower respiratory infection: Secondary | ICD-10-CM | POA: Diagnosis present

## 2017-01-07 DIAGNOSIS — N183 Chronic kidney disease, stage 3 (moderate): Secondary | ICD-10-CM | POA: Insufficient documentation

## 2017-01-07 DIAGNOSIS — R0600 Dyspnea, unspecified: Secondary | ICD-10-CM

## 2017-01-07 DIAGNOSIS — R5383 Other fatigue: Secondary | ICD-10-CM

## 2017-01-07 DIAGNOSIS — I248 Other forms of acute ischemic heart disease: Secondary | ICD-10-CM | POA: Diagnosis present

## 2017-01-07 DIAGNOSIS — Z8249 Family history of ischemic heart disease and other diseases of the circulatory system: Secondary | ICD-10-CM

## 2017-01-07 DIAGNOSIS — Z7982 Long term (current) use of aspirin: Secondary | ICD-10-CM

## 2017-01-07 DIAGNOSIS — D500 Iron deficiency anemia secondary to blood loss (chronic): Secondary | ICD-10-CM

## 2017-01-07 DIAGNOSIS — G4733 Obstructive sleep apnea (adult) (pediatric): Secondary | ICD-10-CM | POA: Diagnosis present

## 2017-01-07 DIAGNOSIS — Z803 Family history of malignant neoplasm of breast: Secondary | ICD-10-CM

## 2017-01-07 LAB — BLOOD GAS, VENOUS
ACID-BASE EXCESS: 13.4 mmol/L — AB (ref 0.0–2.0)
BICARBONATE: 42.1 mmol/L — AB (ref 20.0–28.0)
O2 SAT: 76.6 %
PH VEN: 7.34 (ref 7.250–7.430)
Patient temperature: 37
pCO2, Ven: 78 mmHg (ref 44.0–60.0)
pO2, Ven: 44 mmHg (ref 32.0–45.0)

## 2017-01-07 LAB — COMPREHENSIVE METABOLIC PANEL
ALK PHOS: 149 U/L — AB (ref 38–126)
ALT: 14 U/L (ref 14–54)
AST: 31 U/L (ref 15–41)
Albumin: 2.8 g/dL — ABNORMAL LOW (ref 3.5–5.0)
Anion gap: 10 (ref 5–15)
BUN: 23 mg/dL — AB (ref 6–20)
CHLORIDE: 95 mmol/L — AB (ref 101–111)
CO2: 34 mmol/L — AB (ref 22–32)
CREATININE: 1.57 mg/dL — AB (ref 0.44–1.00)
Calcium: 8.2 mg/dL — ABNORMAL LOW (ref 8.9–10.3)
GFR calc Af Amer: 45 mL/min — ABNORMAL LOW (ref >60)
GFR calc non Af Amer: 39 mL/min — ABNORMAL LOW (ref >60)
GLUCOSE: 178 mg/dL — AB (ref 65–99)
Potassium: 4.6 mmol/L (ref 3.5–5.1)
SODIUM: 139 mmol/L (ref 135–145)
Total Bilirubin: 1 mg/dL (ref 0.3–1.2)
Total Protein: 6.9 g/dL (ref 6.5–8.1)

## 2017-01-07 LAB — GLUCOSE, CAPILLARY
GLUCOSE-CAPILLARY: 198 mg/dL — AB (ref 65–99)
Glucose-Capillary: 249 mg/dL — ABNORMAL HIGH (ref 65–99)

## 2017-01-07 LAB — MRSA PCR SCREENING: MRSA by PCR: NEGATIVE

## 2017-01-07 LAB — CBC WITH DIFFERENTIAL/PLATELET
Basophils Absolute: 0 10*3/uL (ref 0–0.1)
Basophils Relative: 1 %
EOS ABS: 0.1 10*3/uL (ref 0–0.7)
Eosinophils Relative: 2 %
HEMATOCRIT: 34.6 % — AB (ref 35.0–47.0)
HEMOGLOBIN: 10.8 g/dL — AB (ref 12.0–16.0)
LYMPHS ABS: 0.4 10*3/uL — AB (ref 1.0–3.6)
LYMPHS PCT: 7 %
MCH: 26.8 pg (ref 26.0–34.0)
MCHC: 31.3 g/dL — AB (ref 32.0–36.0)
MCV: 85.7 fL (ref 80.0–100.0)
MONOS PCT: 7 %
Monocytes Absolute: 0.4 10*3/uL (ref 0.2–0.9)
NEUTROS ABS: 4.6 10*3/uL (ref 1.4–6.5)
NEUTROS PCT: 83 %
Platelets: 282 10*3/uL (ref 150–440)
RBC: 4.04 MIL/uL (ref 3.80–5.20)
RDW: 14.9 % — ABNORMAL HIGH (ref 11.5–14.5)
WBC: 5.5 10*3/uL (ref 3.6–11.0)

## 2017-01-07 LAB — MAGNESIUM: MAGNESIUM: 1.5 mg/dL — AB (ref 1.7–2.4)

## 2017-01-07 LAB — TROPONIN I: Troponin I: 0.06 ng/mL (ref <0.03)

## 2017-01-07 LAB — BRAIN NATRIURETIC PEPTIDE: B Natriuretic Peptide: 456 pg/mL — ABNORMAL HIGH (ref 0.0–100.0)

## 2017-01-07 IMAGING — CR DG CHEST 2V
1 series · 2 of 2 positions shown · non-contrast
Comparison: [DATE]

CLINICAL DATA: Shortness of Breath

EXAM:
CHEST  2 VIEW

[Series 1: dg chest 2 view · 0.14mm/px · 2 of 2 slices shown]
[im 1/2]
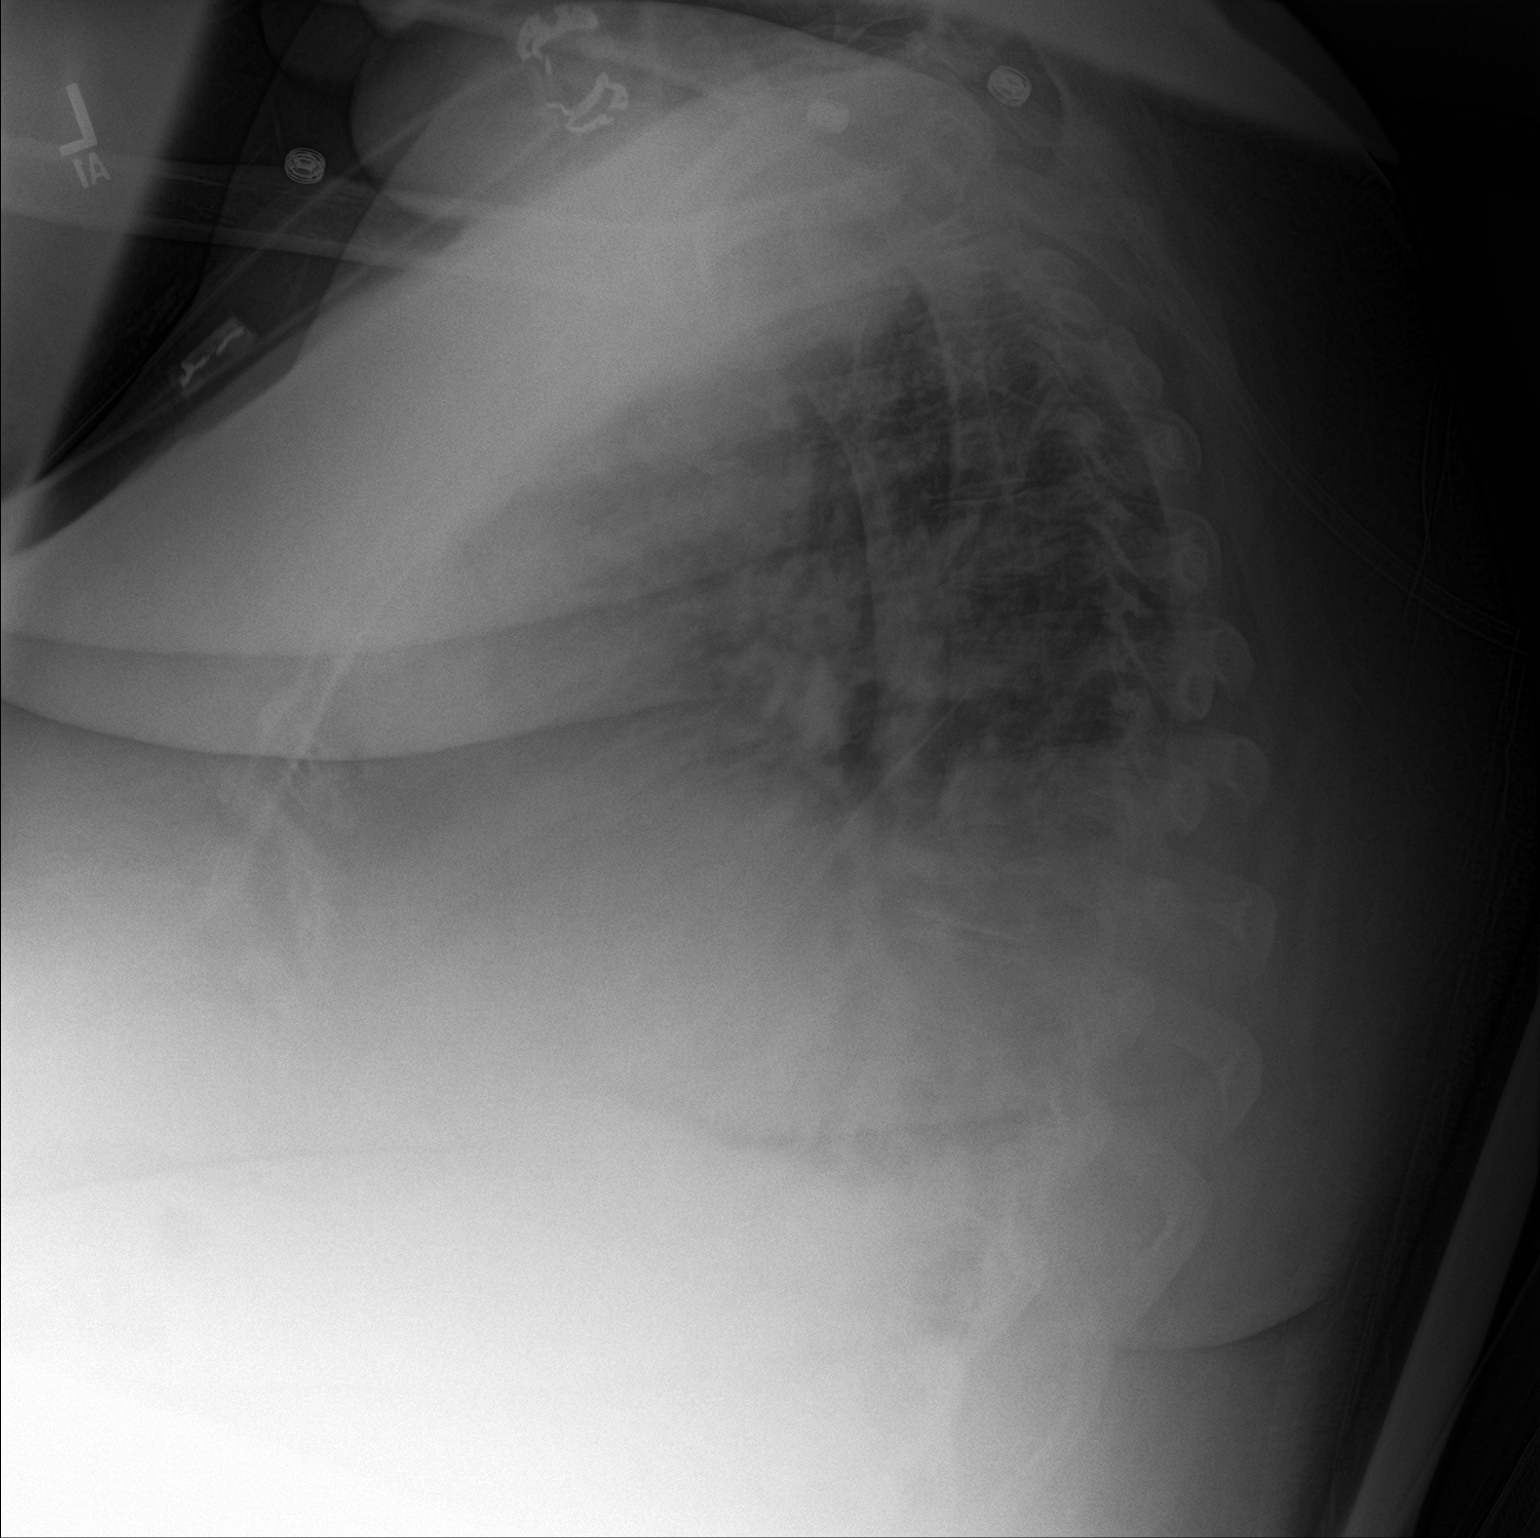
[im 2/2]
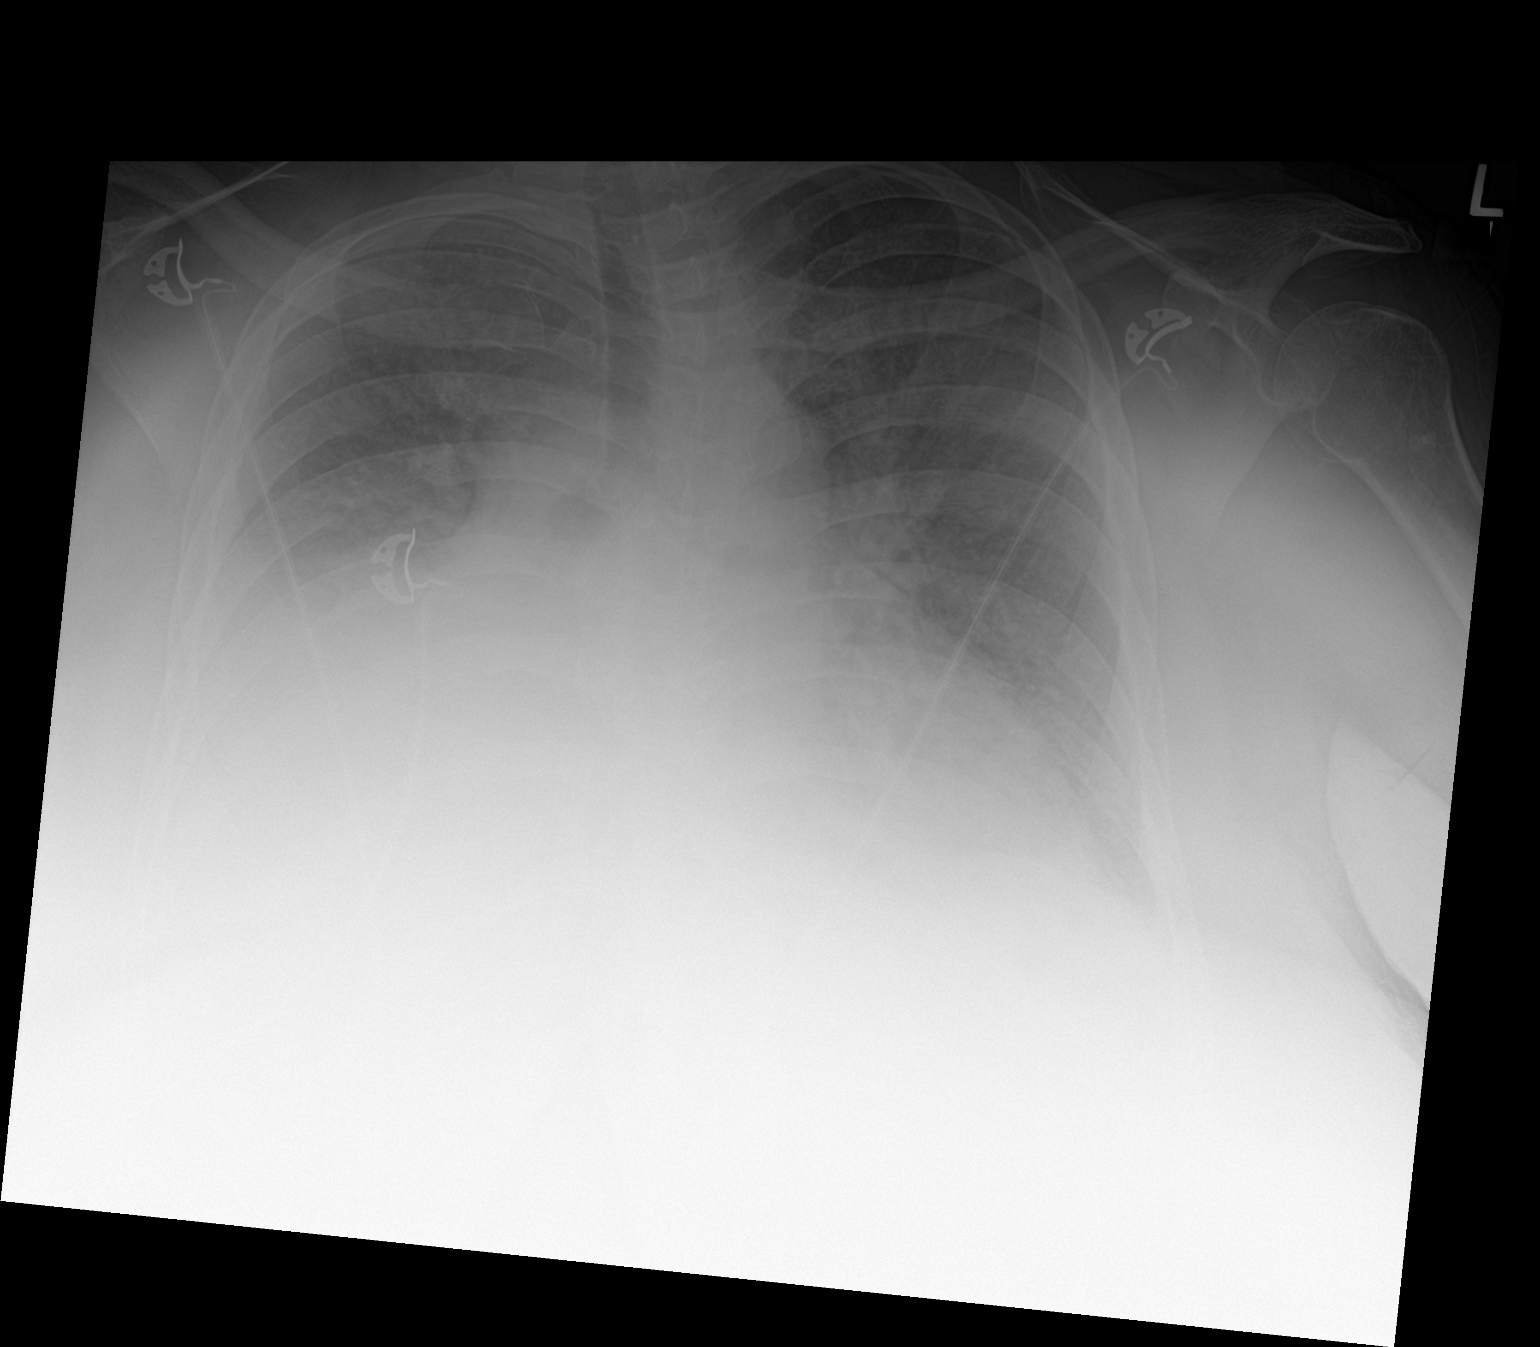

[2 of 2 positions shown; findings below may reference images not displayed]

FINDINGS: Cardiac shadow is enlarged. Increasing right-sided pleural effusion
and likely underlying right basilar atelectasis is noted. Some
central vascular congestion is noted although this may be related to
a poor inspiratory effort. No acute bony abnormality is seen.
IMPRESSION: Increasing right-sided pleural effusion and likely right basilar
infiltrate.

Mild central vascular congestion is noted as well.

## 2017-01-07 MED ORDER — METHYLPREDNISOLONE SODIUM SUCC 125 MG IJ SOLR
125.0000 mg | Freq: Once | INTRAMUSCULAR | Status: AC
  Administered 2017-01-07: 125 mg via INTRAVENOUS
  Filled 2017-01-07: qty 2

## 2017-01-07 MED ORDER — HYDRALAZINE HCL 20 MG/ML IJ SOLN
10.0000 mg | Freq: Four times a day (QID) | INTRAMUSCULAR | Status: DC | PRN
  Administered 2017-01-07: 10 mg via INTRAVENOUS
  Filled 2017-01-07 (×2): qty 1

## 2017-01-07 MED ORDER — ALBUTEROL SULFATE (2.5 MG/3ML) 0.083% IN NEBU
5.0000 mg | INHALATION_SOLUTION | Freq: Once | RESPIRATORY_TRACT | Status: AC
  Administered 2017-01-07: 5 mg via RESPIRATORY_TRACT
  Filled 2017-01-07: qty 6

## 2017-01-07 MED ORDER — BISACODYL 5 MG PO TBEC
5.0000 mg | DELAYED_RELEASE_TABLET | Freq: Every day | ORAL | Status: DC | PRN
  Administered 2017-01-08: 17:00:00 5 mg via ORAL
  Filled 2017-01-07: qty 1

## 2017-01-07 MED ORDER — HEPARIN SODIUM (PORCINE) 5000 UNIT/ML IJ SOLN
5000.0000 [IU] | Freq: Three times a day (TID) | INTRAMUSCULAR | Status: DC
  Administered 2017-01-07 – 2017-01-10 (×8): 5000 [IU] via SUBCUTANEOUS
  Filled 2017-01-07 (×8): qty 1

## 2017-01-07 MED ORDER — ONDANSETRON HCL 4 MG PO TABS: 4.0000 mg | ORAL_TABLET | Freq: Four times a day (QID) | ORAL | Status: DC | PRN

## 2017-01-07 MED ORDER — GUAIFENESIN-DM 100-10 MG/5ML PO SYRP
5.0000 mL | ORAL_SOLUTION | ORAL | Status: DC | PRN
  Filled 2017-01-07: qty 5

## 2017-01-07 MED ORDER — ONDANSETRON HCL 4 MG/2ML IJ SOLN
4.0000 mg | Freq: Four times a day (QID) | INTRAMUSCULAR | Status: DC | PRN
Start: 2017-01-07 — End: 2017-01-10

## 2017-01-07 MED ORDER — SENNOSIDES-DOCUSATE SODIUM 8.6-50 MG PO TABS: 1.0000 | ORAL_TABLET | Freq: Every evening | ORAL | Status: DC | PRN

## 2017-01-07 MED ORDER — METOPROLOL TARTRATE 50 MG PO TABS
100.0000 mg | ORAL_TABLET | Freq: Two times a day (BID) | ORAL | Status: DC
  Administered 2017-01-07 – 2017-01-10 (×6): 100 mg via ORAL
  Filled 2017-01-07 (×6): qty 2

## 2017-01-07 MED ORDER — LOSARTAN POTASSIUM 50 MG PO TABS
50.0000 mg | ORAL_TABLET | Freq: Every day | ORAL | Status: DC
  Administered 2017-01-08 – 2017-01-10 (×3): 50 mg via ORAL
  Filled 2017-01-07 (×3): qty 1

## 2017-01-07 MED ORDER — ATORVASTATIN CALCIUM 20 MG PO TABS
80.0000 mg | ORAL_TABLET | Freq: Every day | ORAL | Status: DC
  Administered 2017-01-07 – 2017-01-09 (×3): 80 mg via ORAL
  Filled 2017-01-07 (×3): qty 4

## 2017-01-07 MED ORDER — HYDRALAZINE HCL 50 MG PO TABS
100.0000 mg | ORAL_TABLET | Freq: Three times a day (TID) | ORAL | Status: DC
  Administered 2017-01-07 – 2017-01-10 (×9): 100 mg via ORAL
  Filled 2017-01-07 (×9): qty 2

## 2017-01-07 MED ORDER — DEXTROSE 5 % IV SOLN
500.0000 mg | Freq: Once | INTRAVENOUS | Status: AC
  Administered 2017-01-07: 500 mg via INTRAVENOUS
  Filled 2017-01-07: qty 500

## 2017-01-07 MED ORDER — ASPIRIN EC 81 MG PO TBEC
81.0000 mg | DELAYED_RELEASE_TABLET | Freq: Every day | ORAL | Status: DC
  Administered 2017-01-08 – 2017-01-10 (×3): 81 mg via ORAL
  Filled 2017-01-07 (×3): qty 1

## 2017-01-07 MED ORDER — ACETAMINOPHEN 650 MG RE SUPP: 650.0000 mg | Freq: Four times a day (QID) | RECTAL | Status: DC | PRN

## 2017-01-07 MED ORDER — TORSEMIDE 20 MG PO TABS
40.0000 mg | ORAL_TABLET | Freq: Every day | ORAL | Status: DC
  Administered 2017-01-08 – 2017-01-09 (×2): 40 mg via ORAL
  Filled 2017-01-07 (×2): qty 2

## 2017-01-07 MED ORDER — HYDROCODONE-ACETAMINOPHEN 5-325 MG PO TABS
1.0000 | ORAL_TABLET | ORAL | Status: DC | PRN
  Administered 2017-01-08 – 2017-01-09 (×4): 2 via ORAL
  Filled 2017-01-07 (×4): qty 2

## 2017-01-07 MED ORDER — ALBUTEROL SULFATE (2.5 MG/3ML) 0.083% IN NEBU
2.5000 mg | INHALATION_SOLUTION | RESPIRATORY_TRACT | Status: DC | PRN
  Administered 2017-01-08 – 2017-01-10 (×5): 2.5 mg via RESPIRATORY_TRACT
  Filled 2017-01-07 (×6): qty 3

## 2017-01-07 MED ORDER — POTASSIUM CHLORIDE ER 10 MEQ PO TBCR
10.0000 meq | EXTENDED_RELEASE_TABLET | Freq: Two times a day (BID) | ORAL | Status: DC
  Administered 2017-01-07 – 2017-01-10 (×6): 10 meq via ORAL
  Filled 2017-01-07 (×13): qty 1

## 2017-01-07 MED ORDER — POLYETHYLENE GLYCOL 3350 17 G PO PACK: 17.0000 g | PACK | Freq: Every day | ORAL | Status: DC | PRN

## 2017-01-07 MED ORDER — SODIUM CHLORIDE 0.9 % IV SOLN
INTRAVENOUS | Status: DC
  Administered 2017-01-07 – 2017-01-08 (×2): via INTRAVENOUS

## 2017-01-07 MED ORDER — IPRATROPIUM BROMIDE 0.02 % IN SOLN
0.5000 mg | Freq: Once | RESPIRATORY_TRACT | Status: AC
  Administered 2017-01-07: 0.5 mg via RESPIRATORY_TRACT
  Filled 2017-01-07: qty 2.5

## 2017-01-07 MED ORDER — INSULIN ASPART 100 UNIT/ML ~~LOC~~ SOLN
0.0000 [IU] | Freq: Every day | SUBCUTANEOUS | Status: DC
  Administered 2017-01-07: 2 [IU] via SUBCUTANEOUS
  Administered 2017-01-09: 3 [IU] via SUBCUTANEOUS
  Filled 2017-01-07 (×2): qty 1

## 2017-01-07 MED ORDER — CEFTRIAXONE SODIUM IN DEXTROSE 20 MG/ML IV SOLN
1.0000 g | Freq: Once | INTRAVENOUS | Status: AC
  Administered 2017-01-07: 1 g via INTRAVENOUS
  Filled 2017-01-07: qty 50

## 2017-01-07 MED ORDER — ACETAMINOPHEN 325 MG PO TABS
650.0000 mg | ORAL_TABLET | Freq: Four times a day (QID) | ORAL | Status: DC | PRN
  Administered 2017-01-07 – 2017-01-10 (×5): 650 mg via ORAL
  Filled 2017-01-07 (×5): qty 2

## 2017-01-07 MED ORDER — INSULIN ASPART 100 UNIT/ML ~~LOC~~ SOLN
0.0000 [IU] | Freq: Three times a day (TID) | SUBCUTANEOUS | Status: DC
  Administered 2017-01-08 (×3): 2 [IU] via SUBCUTANEOUS
  Administered 2017-01-09 (×2): 3 [IU] via SUBCUTANEOUS
  Administered 2017-01-09 – 2017-01-10 (×3): 2 [IU] via SUBCUTANEOUS
  Filled 2017-01-07 (×8): qty 1

## 2017-01-07 MED ORDER — DEXTROSE 5 % IV SOLN
500.0000 mg | INTRAVENOUS | Status: DC
  Administered 2017-01-08 – 2017-01-09 (×2): 500 mg via INTRAVENOUS
  Filled 2017-01-07 (×3): qty 500

## 2017-01-07 MED ORDER — DEXTROSE 5 % IV SOLN
1.0000 g | INTRAVENOUS | Status: DC
  Administered 2017-01-08 – 2017-01-09 (×2): 1 g via INTRAVENOUS
  Filled 2017-01-07 (×3): qty 10

## 2017-01-07 NOTE — ED Notes (Signed)
EKG obtained by student and signed by EDP.

## 2017-01-07 NOTE — ED Provider Notes (Addendum)
[facility] Emergency Department Provider Note  ____________________________________________   I have reviewed the triage vital signs and the nursing notes.   HISTORY  Chief Complaint Shortness of Breath    HPI [name] is a 46 y.o. female who presents today complaining of progressive shortness of breath and weakness for the last several days.  Patient is on 5 L.  Normally she is on 3 which is gone up to 5 she states because she has been feeling more short of breath.  Patient has multiple different reasons for this possibly in her history, she has a history of CHF, with preserved ejection fraction as of last echo of May, 2018.  Patient has a history of hypertension poorly controlled, morbid obesity, COPD.  She denies any fever chills she has had a slight cough.  She just feels "generally weak".  No focal weakness.  Patient was trying to get into the car and her oxygen seem to be working and she was too weak to get in the car and she came here.  She states however even before this incident which rapidly rectify with the application of oxygen, patient has been increasingly short of breath.  She also states she is been somnolent for "1 year".  Denies chest pain today states sometimes she has it.  Not specific about this.  She feels that her legs are somewhat swollen but it is hard to tell and she is unable to get on a scale at home to see if she has gained weight.    Past Medical History:  Diagnosis Date  . (HFpEF) heart failure with preserved ejection fraction ([place])    a. 11/2014 Echo: EF 50-55%, Gr1 DD; b. 06/2016 Echo: EF 60-65%, no rwma, mod dil LA, nl RV, triv eff.  . Asthma   . CKD (chronic kidney disease), stage III ([place])   . COPD (chronic obstructive pulmonary disease) ([place])    a. Home O2.  . Diabetes mellitus without complication ([place])   . Hypertension   . Iron deficiency anemia    a. chronic blood loss->heavy menses.  . Morbid obesity ([place])   .  Pericardial effusion    a. 11/2014 CT Chest: Mod effusion; b. 11/2014 Echo: small to mod circumferential effusion. No hemodynamic compromise.  [name] Poorly controlled diabetes mellitus ([place])    a. 11/2014 A1c 13.2.    Patient Active Problem List   Diagnosis Date Noted  . Chronic diastolic heart failure ([place]) 01/07/2017  . Acute exacerbation of CHF (congestive heart failure) ([place]) 11/07/2016  . Depression 09/24/2016  . Acute on chronic diastolic heart failure ([place]) 07/04/2016  . COPD with chronic bronchitis ([place]) 07/04/2016  . CKD (chronic kidney disease), stage III ([place]) 06/26/2016  . COPD exacerbation ([place]) 03/25/2016  . Anxiety 10/02/2015  . Asthma 08/30/2015  . Poorly controlled type 2 diabetes mellitus ([place]) 03/13/2015  . Iron deficiency anemia 02/17/2015  . Morbid obesity ([place])   . Shortness of breath   . Essential hypertension   . Hypertensive heart disease with congestive heart failure ([place]) 12/18/2014  . Diabetes ([place]) 12/15/2014    Past Surgical History:  Procedure Laterality Date  . CESAREAN SECTION     times 3    Prior to Admission medications   Medication Sig Start Date End Date Taking? Authorizing Provider  acetaminophen (TYLENOL) 500 MG tablet Take 1,000 mg by mouth every 6 (six) hours as needed for mild pain, fever or headache.    [provider]  albuterol (  PROVENTIL HFA;VENTOLIN HFA) 108 (90 Base) MCG/ACT inhaler Inhale 2 puffs into the lungs every 6 (six) hours as needed for wheezing or shortness of breath. 11/16/16   Bettey Costa, MD  albuterol (PROVENTIL) (2.5 MG/3ML) 0.083% nebulizer solution Take 2.5 mg by nebulization every 6 (six) hours as needed for wheezing or shortness of breath.    [provider]  aspirin EC 81 MG tablet Take 81 mg by mouth daily.     [provider]  atorvastatin (LIPITOR) 80 MG tablet Take 1 tablet (80 mg total) by mouth daily at 6 PM. 11/16/16   Mody, Sital, MD  budesonide (PULMICORT) 0.25 MG/2ML nebulizer  solution Take 2 mLs (0.25 mg total) by nebulization 2 (two) times daily. Patient not taking: Reported on 01/07/2017 12/19/14   Dustin Flock, MD  Cholecalciferol (HM VITAMIN D3) 4000 units CAPS Take 1 capsule by mouth once a week.    [provider]  citalopram (CELEXA) 40 MG tablet Take 1 tablet (40 mg total) by mouth daily. Patient not taking: Reported on 01/07/2017 11/16/16   Bettey Costa, MD  hydrALAZINE (APRESOLINE) 100 MG tablet Take 1 tablet (100 mg total) by mouth every 8 (eight) hours. 11/16/16   Bettey Costa, MD  insulin aspart (NOVOLOG FLEXPEN) 100 UNIT/ML FlexPen Inject 0-12 Units into the skin 3 (three) times daily with meals as needed for high blood sugar. Pt uses as needed per sliding scale. 11/16/16   Bettey Costa, MD  metoprolol tartrate (LOPRESSOR) 100 MG tablet Take 1 tablet (100 mg total) by mouth 2 (two) times daily. 11/16/16   Bettey Costa, MD  polyethylene glycol (MIRALAX / GLYCOLAX) packet Take 17 g by mouth daily as needed for mild constipation. 06/29/16   Vaughan Basta, MD  potassium chloride (K-DUR) 10 MEQ tablet Take 1 tablet (10 mEq total) by mouth 2 (two) times daily. 11/16/16   Bettey Costa, MD  torsemide (DEMADEX) 20 MG tablet Take 2 tablets (40 mg total) by mouth daily. 11/16/16   Bettey Costa, MD    Allergies Dust mite extract; Mold extract [trichophyton]; Feraheme [ferumoxytol]; Pollen extract; and Sulfa antibiotics  Family History  Problem Relation Age of Onset  . Diabetes Mother   . Hypertension Mother   . Hypertension Father   . Diabetes Father   . Hypertension Unknown   . Diabetes Unknown   . Breast cancer Maternal Aunt 20  . Breast cancer Maternal Aunt 48    Social History Social History   Tobacco Use  . Smoking status: Never Smoker  . Smokeless tobacco: Never Used  Substance Use Topics  . Alcohol use: Yes    Alcohol/week: 0.0 oz    Comment: occ drink - <1/wk  . Drug use: No    Review of Systems Constitutional: No  fever/chills Eyes: No visual changes. ENT: No sore throat. No stiff neck no neck pain Cardiovascular: Denies chest pain at this time, see HPI Respiratory: On chronic shortness of breath over the last several days Gastrointestinal:   no vomiting.  No diarrhea.  No constipation. Genitourinary: Negative for dysuria. Musculoskeletal: Negative lower extremity swelling Skin: Negative for rash. Neurological: Negative for severe headaches, focal weakness or numbness.   ____________________________________________   PHYSICAL EXAM:  VITAL SIGNS: ED Triage Vitals  Enc Vitals Group     BP 01/07/17 1146 (!) 210/103     Pulse Rate 01/07/17 1146 (!) 103     Resp 01/07/17 1146 (!) 26     Temp 01/07/17 1146 98.1 F (36.7 C)  Temp Source 01/07/17 1146 Oral     SpO2 01/07/17 1146 (!) 81 %     Weight 01/07/17 1147 (!) 328 lb (148.8 kg)     Height 01/07/17 1147 5\' 4"  (1.626 m)     Head Circumference --      Peak Flow --      Pain Score --      Pain Loc --      Pain Edu? --      Excl. in [place]? --     Constitutional: Alert and oriented. Well appearing and in no acute distress. Eyes: Conjunctivae are normal Head: Atraumatic HEENT: No congestion/rhinnorhea. Mucous membranes are moist.  Oropharynx non-erythematous Neck:   Nontender with no meningismus, no masses, no stridor Cardiovascular: Normal rate, regular rhythm. Grossly normal heart sounds.  Good peripheral circulation. Respiratory: Normal respiratory effort.  No retractions.  Diminished in the bases but no wheeze appreciated, morbid obesity limits exam Abdominal: Soft and nontender. No distention. No guarding no rebound orbitally obese, limits exam Back:  There is no focal tenderness or step off.  there is no midline tenderness there are no lesions noted. there is no CVA tenderness {Musculoskeletal: No lower extremity tenderness, no upper extremity tenderness. No joint effusions, no DVT signs strong distal pulses no obvious edema however  morbid obesity limits exam Neurologic:  Normal speech and language. No gross focal neurologic deficits are appreciated.  Skin:  Skin is warm, dry and intact. No rash noted. Psychiatric: Mood and affect are normal. Speech and behavior are normal.  ____________________________________________   LABS (all labs ordered are listed, but only abnormal results are displayed)  Labs Reviewed  COMPREHENSIVE METABOLIC PANEL  CBC WITH DIFFERENTIAL/PLATELET  TROPONIN I  BRAIN NATRIURETIC PEPTIDE  BLOOD GAS, VENOUS    Pertinent labs  results that were available during my care of the patient were reviewed by me and considered in my medical decision making (see chart for details). ____________________________________________  EKG  I personally interpreted any EKGs ordered by me or triage Sinus tach rate 103, normal axis, diffuse low amplitude no acute ST elevation or depression ____________________________________________  RADIOLOGY  Pertinent labs & imaging results that were available during my care of the patient were reviewed by me and considered in my medical decision making (see chart for details). If possible, patient and/or family made aware of any abnormal findings. ____________________________________________    PROCEDURES  Procedure(s) performed: None  Procedures  Critical Care performed: CRITICAL CARE Performed by: Schuyler Amor   Total critical care time: 45 minutes  Critical care time was exclusive of separately billable procedures and treating other patients.  Critical care was necessary to treat or prevent imminent or life-threatening deterioration.  Critical care was time spent personally by me on the following activities: development of treatment plan with patient and/or surrogate as well as nursing, discussions with consultants, evaluation of patient's response to treatment, examination of patient, obtaining history from patient or surrogate, ordering and  performing treatments and interventions, ordering and review of laboratory studies, ordering and review of radiographic studies, pulse oximetry and re-evaluation of patient's condition.   ____________________________________________   INITIAL IMPRESSION / ASSESSMENT AND PLAN / ED COURSE  Pertinent labs & imaging results that were available during my care of the patient were reviewed by me and considered in my medical decision making (see chart for details).  Patient here with significantly increased shortness of breath over her baseline, oxygen saturation are reassuring at this time on her oxygen, will obtain  x-ray, blood work, cardiac enzymes, BNP and try to tease this out.  [name] significant obesity and multiple different concomitant medical conditions limit somewhat our ability to tease this out clinically on exam  ----------------------------------------- 1:52 PM on 01/07/2017 -----------------------------------------  Patient is retaining CO2, pH is reassuring but her CO2 level is almost 80 we will put her on BiPAP, she has been more somnolent recently she states that she is somewhat somnolent in the room do not think she requires intubation however.  Patient is also suffering from what appears to be a possible pneumonia on chest x-ray we will give her empiric antibiotics and patient will likely need to be admitted.   ____________________________________________   FINAL CLINICAL IMPRESSION(S) / ED DIAGNOSES  Final diagnoses:  None      This chart was dictated using voice recognition software.  Despite best efforts to proofread,  errors can occur which can change meaning.      Schuyler Amor, MD 01/07/17 1328    Schuyler Amor, MD 01/07/17 1352

## 2017-01-07 NOTE — H&P (Signed)
[facility] [place] NAME: [name]    MR#:  [id number]  DATE OF BIRTH:  [birth year]  DATE OF ADMISSION:  01/07/2017  PRIMARY CARE PHYSICIAN: [name], NP   REQUESTING/REFERRING PHYSICIAN: Schuyler Amor, MD  CHIEF COMPLAINT:   Chief Complaint  Patient presents with  . Shortness of Breath   Worsening shortness press and weakness today. HISTORY OF PRESENT ILLNESS:  [name]  is a 46 y.o. female with a known history of COPD, chronic respiratory failure on home oxygen 3 L, chronic diastolic CHF, CKD stage III, hypertension and diabetes.  The patient presented the ED with above chief complaints.  She has had progressive shortness of breath and weakness for the past few days and worsening today.  She was found hypoxia, tachypnea and tachycardia, put on oxygen 5 L.  Chest x-ray showed pneumonia.  PAST MEDICAL HISTORY:   Past Medical History:  Diagnosis Date  . (HFpEF) heart failure with preserved ejection fraction ([place])    a. 11/2014 Echo: EF 50-55%, Gr1 DD; b. 06/2016 Echo: EF 60-65%, no rwma, mod dil LA, nl RV, triv eff.  . Asthma   . CKD (chronic kidney disease), stage III ([place])   . COPD (chronic obstructive pulmonary disease) ([place])    a. Home O2.  . Diabetes mellitus without complication ([place])   . Hypertension   . Iron deficiency anemia    a. chronic blood loss->heavy menses.  . Morbid obesity ([place])   . Pericardial effusion    a. 11/2014 CT Chest: Mod effusion; b. 11/2014 Echo: small to mod circumferential effusion. No hemodynamic compromise.  [name] Poorly controlled diabetes mellitus ([place])    a. 11/2014 A1c 13.2.    PAST SURGICAL HISTORY:   Past Surgical History:  Procedure Laterality Date  . CESAREAN SECTION     times 3    SOCIAL HISTORY:   Social History   Tobacco Use  . Smoking status: Never Smoker  . Smokeless tobacco: Never Used  Substance Use Topics  . Alcohol use: Yes    Alcohol/week: 0.0 oz      Comment: occ drink - <1/wk    FAMILY HISTORY:   Family History  Problem Relation Age of Onset  . Diabetes Mother   . Hypertension Mother   . Hypertension Father   . Diabetes Father   . Hypertension Unknown   . Diabetes Unknown   . Breast cancer Maternal Aunt 17  . Breast cancer Maternal Aunt 50    DRUG ALLERGIES:   Allergies  Allergen Reactions  . Dust Mite Extract Shortness Of Breath and Itching  . Mold Extract [Trichophyton] Shortness Of Breath and Itching  . Feraheme [Ferumoxytol] Itching  . Pollen Extract   . Sulfa Antibiotics Itching    REVIEW OF SYSTEMS:   Review of Systems  Constitutional: Positive for malaise/fatigue. Negative for chills and fever.  HENT: Negative for sore throat.   Eyes: Negative for blurred vision and double vision.  Respiratory: Positive for cough, sputum production and shortness of breath. Negative for hemoptysis, wheezing and stridor.   Cardiovascular: Negative for chest pain, palpitations, orthopnea and leg swelling.  Gastrointestinal: Negative for abdominal pain, blood in stool, diarrhea, melena, nausea and vomiting.  Genitourinary: Negative for dysuria, flank pain and hematuria.  Musculoskeletal: Negative for back pain and joint pain.  Skin: Negative for rash.  Neurological: Positive for weakness. Negative for dizziness, sensory change, focal weakness, seizures, loss of consciousness and headaches.  Endo/Heme/Allergies: Negative for polydipsia.  Psychiatric/Behavioral: Negative for depression. The patient is not nervous/anxious.     MEDICATIONS AT HOME:   Prior to Admission medications   Medication Sig Start Date End Date Taking? Authorizing Provider  albuterol (PROVENTIL) (2.5 MG/3ML) 0.083% nebulizer solution Take 2.5 mg by nebulization every 6 (six) hours as needed for wheezing or shortness of breath.   Yes [provider]  aspirin EC 81 MG tablet Take 81 mg by mouth daily.    Yes [provider]   atorvastatin (LIPITOR) 80 MG tablet Take 1 tablet (80 mg total) by mouth daily at 6 PM. 11/16/16  Yes Mody, Sital, MD  Cholecalciferol (HM VITAMIN D3) 4000 units CAPS Take 1 capsule by mouth once a week.   Yes [provider]  hydrALAZINE (APRESOLINE) 100 MG tablet Take 1 tablet (100 mg total) by mouth every 8 (eight) hours. 11/16/16  Yes Mody, Ulice Bold, MD  insulin aspart (NOVOLOG FLEXPEN) 100 UNIT/ML FlexPen Inject 0-12 Units into the skin 3 (three) times daily with meals as needed for high blood sugar. Pt uses as needed per sliding scale. 11/16/16  Yes Mody, Ulice Bold, MD  losartan (COZAAR) 50 MG tablet Take 50 mg daily by mouth.   Yes [provider]  metoprolol tartrate (LOPRESSOR) 100 MG tablet Take 1 tablet (100 mg total) by mouth 2 (two) times daily. 11/16/16  Yes Mody, Ulice Bold, MD  potassium chloride (K-DUR) 10 MEQ tablet Take 1 tablet (10 mEq total) by mouth 2 (two) times daily. 11/16/16  Yes Mody, Ulice Bold, MD  torsemide (DEMADEX) 20 MG tablet Take 2 tablets (40 mg total) by mouth daily. 11/16/16  Yes Bettey Costa, MD  acetaminophen (TYLENOL) 500 MG tablet Take 1,000 mg by mouth every 6 (six) hours as needed for mild pain, fever or headache.    [provider]  albuterol (PROVENTIL HFA;VENTOLIN HFA) 108 (90 Base) MCG/ACT inhaler Inhale 2 puffs into the lungs every 6 (six) hours as needed for wheezing or shortness of breath. 11/16/16   Bettey Costa, MD  budesonide (PULMICORT) 0.25 MG/2ML nebulizer solution Take 2 mLs (0.25 mg total) by nebulization 2 (two) times daily. Patient not taking: Reported on 01/07/2017 12/19/14   Dustin Flock, MD  citalopram (CELEXA) 40 MG tablet Take 1 tablet (40 mg total) by mouth daily. Patient not taking: Reported on 01/07/2017 11/16/16   Bettey Costa, MD  polyethylene glycol (MIRALAX / GLYCOLAX) packet Take 17 g by mouth daily as needed for mild constipation. 06/29/16   Vaughan Basta, MD      VITAL SIGNS:  Blood pressure 138/72, pulse (!)  110, temperature 98.1 F (36.7 C), temperature source Oral, resp. rate (!) 29, height 5\' 4"  (1.626 m), weight (!) 328 lb (148.8 kg), SpO2 93 %.  PHYSICAL EXAMINATION:  Physical Exam  GENERAL:  46 y.o.-year-old patient lying in the bed with no acute distress.  Morbid obesity EYES: Pupils equal, round, reactive to light and accommodation. No scleral icterus. Extraocular muscles intact.  HEENT: Head atraumatic, normocephalic. Oropharynx and nasopharynx clear.  NECK:  Supple, no jugular venous distention. No thyroid enlargement, no tenderness.  LUNGS: Normal breath sounds bilaterally, no wheezing, rales,rhonchi or crepitation. No use of accessory muscles of respiration.  CARDIOVASCULAR: S1, S2 normal. No murmurs, rubs, or gallops.  ABDOMEN: Soft, nontender, nondistended. Bowel sounds present. No organomegaly or mass.  EXTREMITIES: No pedal edema, cyanosis, or clubbing.  NEUROLOGIC: Cranial nerves II through XII are intact. Muscle strength 4/5 in all extremities. Sensation intact. Gait not  checked.  PSYCHIATRIC: The patient is alert and oriented x 3.  SKIN: No obvious rash, lesion, or ulcer.   LABORATORY PANEL:   CBC Recent Labs  Lab 01/07/17 1303  WBC 5.5  HGB 10.8*  HCT 34.6*  PLT 282   ------------------------------------------------------------------------------------------------------------------  Chemistries  Recent Labs  Lab 01/07/17 1403  NA 139  K 4.6  CL 95*  CO2 34*  GLUCOSE 178*  BUN 23*  CREATININE 1.57*  CALCIUM 8.2*  AST 31  ALT 14  ALKPHOS 149*  BILITOT 1.0   ------------------------------------------------------------------------------------------------------------------  Cardiac Enzymes Recent Labs  Lab 01/07/17 1403  TROPONINI 0.06*   ------------------------------------------------------------------------------------------------------------------  RADIOLOGY:  Dg Chest 2 View  Result Date: 01/07/2017 CLINICAL DATA:  Shortness of Breath EXAM:  CHEST  2 VIEW COMPARISON:  11/13/2016 FINDINGS: Cardiac shadow is enlarged. Increasing right-sided pleural effusion and likely underlying right basilar atelectasis is noted. Some central vascular congestion is noted although this may be related to a poor inspiratory effort. No acute bony abnormality is seen. IMPRESSION: Increasing right-sided pleural effusion and likely right basilar infiltrate. Mild central vascular congestion is noted as well. Electronically Signed   By: Inez Catalina M.D.   On: 01/07/2017 12:45      IMPRESSION AND PLAN:   Acute on chronic respiratory failure due to pneumonia. The patient will be admitted to medical floor. Continue oxygen by nasal cannula, nebulizer as needed, continue Zithromax and Rocephin, Robitussin as needed.  Elevated troponin.  Due to demanding ischemia. Continue aspirin.  COPD.  Stable continue home nebulizer. Diabetes.  Start sliding scale  Hypertension.  Continue home hypertension medication. CKD stage III.  Stable.  Morbid obesity.  All the records are reviewed and case discussed with ED provider. Management plans discussed with the patient, family and they are in agreement.  CODE STATUS: Full code  TOTAL TIME TAKING CARE OF THIS PATIENT: 55 minutes.    Demetrios Loll M.D on 01/07/2017 at 4:35 PM  Between 7am to 6pm - Pager - [phone]  After 6pm go to [email or web address] - [employer] [facility]  Office  [phone]  CC: Primary care physician; [name], NP   Note: This dictation was prepared with Dragon dictation along with smaller phrase technology. Any transcriptional errors that result from this process are unin

## 2017-01-07 NOTE — Progress Notes (Signed)
Patient ID: [name], female    DOB: 1970-07-30, 46 y.o.   MRN: [id number]  HPI  Ms [name] is a 46 y/o female with a history of DM, iron deficiency anemia, HTN, COPD with chronic oxygen use, asthma, obesity and chronic heart failure.   Reviewed last echo done 06/27/16 which showed an EF of 60-65%. Previous echo was done 12/15/14 and showed an EF of 50-55% without any valvular regurgitation.   Admitted 11/07/16 due to hypoxic respiratory failure due to HF. Diuresed ~ 13L while hospitalized. Cardiology consult was obtained. Elevated troponin thought to be due to demand ischemia. Discharged home after 9 days.Admitted 06/25/16 with HF/COPD exacerbation. Initially treated with IV diuretics and transitioned to oral diuretics. Cardiology consult obtained. Also needed IV steroids initially along with nebulizer treatment. Nephrology consult also obtained. Discharged home after 4 days.   She presents today for a follow-up visit with a chief complaint of moderate fatigue with little exertion. She describes this as chronic in nature having been present for several years with varying levels of severity. She has associated shortness of breath, cough, wheezing, light-headedness, weakness and depression along with this. She denies any chest pain, edema, palpitations or weight gain. She says that she felt very sleepy but then realized that her oxygen tank had run out while in the waiting room. She was placed on 4L on our oxygen tank while her husband went to get her other portable tank.   Past Medical History:  Diagnosis Date  . (HFpEF) heart failure with preserved ejection fraction ([place])    a. 11/2014 Echo: EF 50-55%, Gr1 DD; b. 06/2016 Echo: EF 60-65%, no rwma, mod dil LA, nl RV, triv eff.  . Asthma   . CKD (chronic kidney disease), stage III ([place])   . COPD (chronic obstructive pulmonary disease) ([place])    a. Home O2.  . Diabetes mellitus without complication ([place])   . Hypertension   . Iron deficiency anemia     a. chronic blood loss->heavy menses.  . Morbid obesity ([place])   . Pericardial effusion    a. 11/2014 CT Chest: Mod effusion; b. 11/2014 Echo: small to mod circumferential effusion. No hemodynamic compromise.  [name] Poorly controlled diabetes mellitus ([place])    a. 11/2014 A1c 13.2.   Past Surgical History:  Procedure Laterality Date  . CESAREAN SECTION     times 3   Family History  Problem Relation Age of Onset  . Diabetes Mother   . Hypertension Mother   . Hypertension Father   . Diabetes Father   . Hypertension Unknown   . Diabetes Unknown   . Breast cancer Maternal Aunt 11  . Breast cancer Maternal Aunt 39   Social History   Tobacco Use  . Smoking status: Never Smoker  . Smokeless tobacco: Never Used  Substance Use Topics  . Alcohol use: Yes    Alcohol/week: 0.0 oz    Comment: occ drink - <1/wk   Allergies  Allergen Reactions  . Dust Mite Extract Shortness Of Breath and Itching  . Mold Extract [Trichophyton] Shortness Of Breath and Itching  . Feraheme [Ferumoxytol] Itching  . Pollen Extract   . Sulfa Antibiotics Itching   Prior to Admission medications   Medication Sig Start Date End Date Taking? Authorizing Provider  acetaminophen (TYLENOL) 500 MG tablet Take 1,000 mg by mouth every 6 (six) hours as needed for mild pain, fever or headache.   Yes [provider]  albuterol (PROVENTIL HFA;VENTOLIN HFA) 108 (90 Base)  MCG/ACT inhaler Inhale 2 puffs into the lungs every 6 (six) hours as needed for wheezing or shortness of breath. 11/16/16  Yes Mody, Ulice Bold, MD  albuterol (PROVENTIL) (2.5 MG/3ML) 0.083% nebulizer solution Take 2.5 mg by nebulization every 6 (six) hours as needed for wheezing or shortness of breath.   Yes [provider]  aspirin EC 81 MG tablet Take 81 mg by mouth daily.    Yes [provider]  atorvastatin (LIPITOR) 80 MG tablet Take 1 tablet (80 mg total) by mouth daily at 6 PM. 11/16/16  Yes Mody, Sital, MD  Cholecalciferol (HM  VITAMIN D3) 4000 units CAPS Take 1 capsule by mouth once a week.   Yes [provider]  hydrALAZINE (APRESOLINE) 100 MG tablet Take 1 tablet (100 mg total) by mouth every 8 (eight) hours. 11/16/16  Yes Mody, Ulice Bold, MD  insulin aspart (NOVOLOG FLEXPEN) 100 UNIT/ML FlexPen Inject 0-12 Units into the skin 3 (three) times daily with meals as needed for high blood sugar. Pt uses as needed per sliding scale. 11/16/16  Yes Bettey Costa, MD  metoprolol tartrate (LOPRESSOR) 100 MG tablet Take 1 tablet (100 mg total) by mouth 2 (two) times daily. 11/16/16  Yes Mody, Ulice Bold, MD  polyethylene glycol (MIRALAX / GLYCOLAX) packet Take 17 g by mouth daily as needed for mild constipation. 06/29/16  Yes Vaughan Basta, MD  potassium chloride (K-DUR) 10 MEQ tablet Take 1 tablet (10 mEq total) by mouth 2 (two) times daily. 11/16/16  Yes Mody, Ulice Bold, MD  torsemide (DEMADEX) 20 MG tablet Take 2 tablets (40 mg total) by mouth daily. 11/16/16  Yes Mody, Sital, MD  budesonide (PULMICORT) 0.25 MG/2ML nebulizer solution Take 2 mLs (0.25 mg total) by nebulization 2 (two) times daily. Patient not taking: Reported on 01/07/2017 12/19/14   Dustin Flock, MD  citalopram (CELEXA) 40 MG tablet Take 1 tablet (40 mg total) by mouth daily. Patient not taking: Reported on 01/07/2017 11/16/16   Bettey Costa, MD  losartan (COZAAR) 50 MG tablet Take 50 mg daily by mouth.    [provider]    Review of Systems  Constitutional: Positive for appetite change (decreased) and fatigue.  HENT: Negative for congestion, postnasal drip and sinus pressure.   Eyes: Negative.   Respiratory: Positive for cough, shortness of breath and wheezing. Negative for chest tightness.   Cardiovascular: Negative for chest pain, palpitations and leg swelling.  Gastrointestinal: Negative for abdominal distention and abdominal pain.  Endocrine: Negative.   Genitourinary: Negative.   Musculoskeletal: Negative for back pain and neck pain.  Skin:  Negative.   Allergic/Immunologic: Negative.   Neurological: Positive for weakness (improving) and light-headedness. Negative for dizziness.  Hematological: Negative for adenopathy. Does not bruise/bleed easily.  Psychiatric/Behavioral: Positive for dysphoric mood. Negative for sleep disturbance and suicidal ideas. The patient is nervous/anxious.    Vitals:   01/07/17 0928  BP: (!) 185/90  Pulse: (!) 107  Resp: 18  SpO2: 95%  Weight: (!) 328 lb (148.8 kg)  Height: 5\' 4"  (1.626 m)   Wt Readings from Last 3 Encounters:  01/07/17 (!) 328 lb (148.8 kg)  11/16/16 (!) 341 lb 11.2 oz (155 kg)  09/23/16 (!) 318 lb 8 oz (144.5 kg)    Lab Results  Component Value Date   CREATININE 1.98 (H) 11/16/2016   CREATININE 2.15 (H) 11/15/2016   CREATININE 2.02 (H) 11/14/2016    Physical Exam  Constitutional: She is oriented to person, place, and time. She appears well-developed and well-nourished.  HENT:  Head: Normocephalic and atraumatic.  Neck: Normal range of motion. Neck supple. No JVD present.  Cardiovascular: Normal rate and regular rhythm.  Pulmonary/Chest: Effort normal. She has no wheezes. She has no rales.  Abdominal: She exhibits no distension. There is no tenderness.  Musculoskeletal: She exhibits edema (1+ edema around bilateral ankles). She exhibits no tenderness.  Neurological: She is alert and oriented to person, place, and time.  Skin: Skin is warm and dry.  Psychiatric: She has a normal mood and affect. Her behavior is normal. Thought content normal.  Nursing note and vitals reviewed.   Assessment & Plan:  1: Chronic heart failure with preserved ejection fraction- - NYHA class III - euvolemic today - patient says that her stated weight is 328 pounds and she refuses to get on the scale today as she says that she feels too tired - continue daily torsemide - BMP done 11/16/16 reviewed and shows potassium 5.0 with GFR 34. - not adding salt and trying to follow a 2000mg   sodium diet - says that she's drinking ~ 1.5 hospital cups of fluid daily - encouraged increased activty - last saw cardiologist Rockey Situ) 12/25/15.  - does not meet ReDS vest criteria due to BMI - reports receiving her flu vaccine for this season already - admits that she skips her torsemide "sometimes" because she's worried about her kidney function. Discussed the importance of taking the diuretic as prescribed to keep the fluid off and to help with her BP - BMP drawn today  2: COPD- - saw pulmonologist ([place]) 07/17/16 - wears oxygen on a continuous setting of 4L around the clock - patient sleepy today at times but wakes easily; has had issues in the past with elevated carbon dioxide levels - needs f/u with pulmonologist and has recently gotten approved for charity care through [facility]  3: HTN- - BP slightly elevated today but she's been skipping doses of torsemide - saw PCP on 04/29/16 - due to see nephrologist ([place])  4: Anemia- - saw hematologist Grayland Ormond) 08/22/16 and received a dose of feraheme - reports "all over" joint/muscle pain - CBC drawn today - patient says that she feels like she needs an iron infusion and now has charity care through [facility]; encouraged her and her husband to call about an appointment  Patient did not bring her medications nor a list. Each medication was verbally reviewed with the patient and she was encouraged to bring the bottles to every visit to confirm accuracy of list.  Return in 1 month or sooner for any questions/problems before then.

## 2017-01-07 NOTE — ED Notes (Signed)
Date and time results received: 01/07/17 1343   Test: pco2 in VBG Critical Value: 33  Name of Provider Notified: Dr. Burlene Arnt

## 2017-01-07 NOTE — Patient Instructions (Signed)
Continue weighing daily and call for an overnight weight gain of > 2 pounds or a weekly weight gain of >5 pounds. 

## 2017-01-07 NOTE — ED Notes (Signed)
Drew light green top tube, sent to lab.

## 2017-01-07 NOTE — ED Triage Notes (Signed)
Pt needed help getting out of car, states she had a panic attack because her legs were so weak today, shob with sats at 81%; pt had her home O2 intact on 5L, however, her tank was very low. Upon placing pt on our oxygen, sats increased to 95% on 5L, pt is on 3L per [place] at home. No wheezing noted, shob controlled. Pt states she just came from chf clinic and was told her shob is not heart failure related at this time.

## 2017-01-08 LAB — BASIC METABOLIC PANEL
ANION GAP: 9 (ref 5–15)
BUN: 24 mg/dL — ABNORMAL HIGH (ref 6–20)
CALCIUM: 8.4 mg/dL — AB (ref 8.9–10.3)
CHLORIDE: 94 mmol/L — AB (ref 101–111)
CO2: 33 mmol/L — AB (ref 22–32)
Creatinine, Ser: 1.73 mg/dL — ABNORMAL HIGH (ref 0.44–1.00)
GFR calc non Af Amer: 35 mL/min — ABNORMAL LOW (ref >60)
GFR, EST AFRICAN AMERICAN: 40 mL/min — AB (ref >60)
Glucose, Bld: 191 mg/dL — ABNORMAL HIGH (ref 65–99)
Potassium: 4.1 mmol/L (ref 3.5–5.1)
Sodium: 136 mmol/L (ref 135–145)

## 2017-01-08 LAB — CBC
HCT: 33.3 % — ABNORMAL LOW (ref 35.0–47.0)
HEMOGLOBIN: 10.4 g/dL — AB (ref 12.0–16.0)
MCH: 26.5 pg (ref 26.0–34.0)
MCHC: 31.1 g/dL — ABNORMAL LOW (ref 32.0–36.0)
MCV: 85.2 fL (ref 80.0–100.0)
Platelets: 296 10*3/uL (ref 150–440)
RBC: 3.92 MIL/uL (ref 3.80–5.20)
RDW: 14.6 % — ABNORMAL HIGH (ref 11.5–14.5)
WBC: 4.6 10*3/uL (ref 3.6–11.0)

## 2017-01-08 LAB — GLUCOSE, CAPILLARY
GLUCOSE-CAPILLARY: 190 mg/dL — AB (ref 65–99)
Glucose-Capillary: 186 mg/dL — ABNORMAL HIGH (ref 65–99)
Glucose-Capillary: 162 mg/dL — ABNORMAL HIGH (ref 65–99)
Glucose-Capillary: 182 mg/dL — ABNORMAL HIGH (ref 65–99)

## 2017-01-08 LAB — MAGNESIUM: MAGNESIUM: 1.4 mg/dL — AB (ref 1.7–2.4)

## 2017-01-08 MED ORDER — MAGNESIUM SULFATE 2 GM/50ML IV SOLN
2.0000 g | Freq: Once | INTRAVENOUS | Status: AC
  Administered 2017-01-08: 15:00:00 2 g via INTRAVENOUS
  Filled 2017-01-08: qty 50

## 2017-01-08 MED ORDER — FUROSEMIDE 10 MG/ML IJ SOLN
40.0000 mg | Freq: Once | INTRAMUSCULAR | Status: AC
  Administered 2017-01-08: 19:00:00 40 mg via INTRAVENOUS
  Filled 2017-01-08: qty 4

## 2017-01-08 NOTE — Care Management Note (Signed)
Case Management Note  Patient Details  Name: [name] MRN: [id number] Date of Birth: 1970-03-21  Subjective/Objective:  Admitted to [facility] with the diagnosis of pneumonia. Lives with husband, Orland Mustard [phone]). Seen Evern Bio NP in the past. No insurance. Helped with medications per [facility] program last admission (10/2016). [facility] and Medication Management application also. Medicaid application filled out at that time. Followed by [facility] for physical therapy, occupational therapy and aide at this time. Attends the [facility]. Last attendance was 01/07/17.                  Home oxygen per [facility]. Bariatric rolling walker, bedside commode, and nebulizer in the home. Personal Care services in the past. No skilled Nursing facility.  Decreased appetite for many months. No weight loss. Last fall was 3 months ago. Husband will transport.  Action/Plan: Discussed [facility] and Medication Management when discharged   Expected Discharge Date:                  Expected Discharge Plan:     In-House Referral:     Discharge planning Services     Post Acute Care Choice:    Choice offered to:     DME Arranged:    DME Agency:     HH Arranged:    HH Agency:     Status of Service:     If discussed at [employer], dates discussed:    Additional Comments:  Shelbie Ammons, RN MSN CCM Care Management [phone] 01/08/2017, 10:59 AM

## 2017-01-08 NOTE — Clinical Social Work Note (Signed)
Clinical Social Work Assessment  Patient Details  Name: [name] MRN: [id number] Date of Birth: Dec 17, 1970  Date of referral:  01/08/17               Reason for consult:  [employer]                Permission sought to share information with:  Case Optician, dispensing granted to share information::  Yes, Verbal Permission Granted  Name::        Agency::     Relationship::     Contact Information:     Housing/Transportation Living arrangements for the past 2 months:  Apartment Source of Information:  Patient, Spouse Patient Interpreter Needed:  None Criminal Activity/Legal Involvement Pertinent to Current Situation/Hospitalization:  No - Comment as needed Significant Relationships:  Adult Children, Spouse, Friend Lives with:  Spouse Do you feel safe going back to the place where you live?  Yes Need for family participation in patient care:  Yes (Comment)  Care giving concerns:  Patient lives in [place] with her husband [name] and 45 y.o daughter.    Social Worker assessment / plan:  Holiday representative (CSW) received consult to assess for needs. CSW met with patient and her husband [name] was at bedside. Patient was alert and oriented X4 and was independently transferring herself from bedside commode to the bed. CSW introduced self and explained role of CSW department. Patient reported that she lives with her husband and 45 y.o daughter in an apartment in [place]. Per patient she doesn't work and is fighting to get on disability. Per husband he recently lost his disability. Husband reported that he is working and has a car that is reliable. Husband asked about assistance with paying for the rent. CSW provided patient and husband a list of [facility] resources and directed them to call DSS to see if they can get funds for the rent. Patient reported that she gets oxygen through [facility] and requested a bigger concentrator because hers only goes up to 5 liters. RN  case manager aware of above. CSW also discussed [facility] and [facility] as options for patient to get counsling and mental health treatment for her anxiety. Patient thanked CSW and reported no other needs at this time. CSW will continue to follow and assist as needed.   Employment status:  Disabled (Comment on whether or not currently receiving Disability), Unemployed Insurance information:  Self Pay (Medicaid Pending) PT Recommendations:  Not assessed at this time Information / Referral to community resources:  Other (Comment Required)([employer]. )  Patient/Family's Response to care:  Patient accepted community resources.   Patient/Family's Understanding of and Emotional Response to Diagnosis, Current Treatment, and Prognosis:  Patient was very pleasant and thanked CSW for assistance.   Emotional Assessment Appearance:  Appears stated age Attitude/Demeanor/Rapport:    Affect (typically observed):  Accepting, Adaptable, Pleasant Orientation:  Oriented to Self, Oriented to Place, Oriented to  Time, Oriented to Situation Alcohol / Substance use:  Not Applicable Psych involvement (Current and /or in the community):  No (Comment)  Discharge Needs  Concerns to be addressed:  Discharge Planning Concerns Readmission within the last 30 days:  No Current discharge risk:  Chronically ill, Inadequate Financial Supports Barriers to Discharge:  Continued Medical Work up   [employer], [name] Beets, LCSW 01/08/2017, 2:34 PM

## 2017-01-08 NOTE — Progress Notes (Signed)
[facility] [place] NAME: [name]    MR#:  [id number]  DATE OF BIRTH:  05-Mar-1970  SUBJECTIVE:  CHIEF COMPLAINT:   Chief Complaint  Patient presents with  . Shortness of Breath   Still having shortness of breath and the cough.  On O2 [place] 5 L. REVIEW OF SYSTEMS:  Review of Systems  Constitutional: Positive for malaise/fatigue. Negative for chills and fever.  HENT: Negative for sore throat.   Eyes: Negative for blurred vision and double vision.  Respiratory: Positive for cough and shortness of breath. Negative for hemoptysis, wheezing and stridor.   Cardiovascular: Negative for chest pain, palpitations, orthopnea and leg swelling.  Gastrointestinal: Negative for abdominal pain, blood in stool, diarrhea, melena, nausea and vomiting.  Genitourinary: Negative for dysuria, flank pain and hematuria.  Musculoskeletal: Negative for back pain and joint pain.  Skin: Negative for rash.  Neurological: Positive for weakness. Negative for dizziness, sensory change, focal weakness, seizures, loss of consciousness and headaches.  Endo/Heme/Allergies: Negative for polydipsia.  Psychiatric/Behavioral: Negative for depression. The patient is not nervous/anxious.     DRUG ALLERGIES:   Allergies  Allergen Reactions  . Dust Mite Extract Shortness Of Breath and Itching  . Mold Extract [Trichophyton] Shortness Of Breath and Itching  . Feraheme [Ferumoxytol] Itching  . Pollen Extract   . Sulfa Antibiotics Itching   VITALS:  Blood pressure (!) 160/96, pulse (!) 110, temperature 98.3 F (36.8 C), temperature source Oral, resp. rate 16, height 5\' 4"  (1.626 m), weight (!) 352 lb 12.8 oz (160 kg), SpO2 95 %. PHYSICAL EXAMINATION:  Physical Exam  Constitutional: She is oriented to person, place, and time and well-developed, well-nourished, and in no distress.  Morbid obesity.  HENT:  Head: Normocephalic.  Mouth/Throat: Oropharynx is clear and moist.   Eyes: Conjunctivae and EOM are normal. Pupils are equal, round, and reactive to light. No scleral icterus.  Neck: Normal range of motion. Neck supple. No JVD present. No tracheal deviation present.  Cardiovascular: Normal rate, regular rhythm and normal heart sounds. Exam reveals no gallop.  No murmur heard. Pulmonary/Chest: Effort normal and breath sounds normal. No respiratory distress. She has no wheezes. She has no rales.  Abdominal: Soft. Bowel sounds are normal. She exhibits no distension. There is no tenderness. There is no rebound.  Musculoskeletal: Normal range of motion. She exhibits no edema or tenderness.  Neurological: She is alert and oriented to person, place, and time. No cranial nerve deficit.  Skin: No rash noted. No erythema.  Psychiatric: Affect normal.   LABORATORY PANEL:  Female CBC Recent Labs  Lab 01/08/17 0711  WBC 4.6  HGB 10.4*  HCT 33.3*  PLT 296   ------------------------------------------------------------------------------------------------------------------ Chemistries  Recent Labs  Lab 01/07/17 1403 01/08/17 0711 01/08/17 0727  NA 139 136  --   K 4.6 4.1  --   CL 95* 94*  --   CO2 34* 33*  --   GLUCOSE 178* 191*  --   BUN 23* 24*  --   CREATININE 1.57* 1.73*  --   CALCIUM 8.2* 8.4*  --   MG 1.5*  --  1.4*  AST 31  --   --   ALT 14  --   --   ALKPHOS 149*  --   --   BILITOT 1.0  --   --    RADIOLOGY:  No results found. ASSESSMENT AND PLAN:   Acute on chronic respiratory failure due to pneumonia  and right-sided pleural effusion. Try to wean down oxygen by nasal cannula, nebulizer as needed, continue Zithromax and Rocephin, Robitussin as needed.  Hypomagnesemia.  Give IV magnesium in the follow-up level.  Elevated troponin.  Due to demanding ischemia. Continue aspirin.  COPD.  Stable continue home nebulizer. Diabetes.  on sliding scale  Chronic diastolic CHF.  Stable.  Continue torsemide.  Hypertension.  Continue home  hypertension medication. CKD stage III.  Stable. Anemia of chronic disease.  Hemoglobin is stable. Morbid obesity and OSA. CPAP at night.  All the records are reviewed and case discussed with Care Management/Social Worker. Management plans discussed with the patient, family and they are in agreement.  CODE STATUS: Full Code  TOTAL TIME TAKING CARE OF THIS PATIENT: 33 minutes.   More than 50% of the time was spent in counseling/coordination of care: YES  POSSIBLE D/C IN 2 DAYS, DEPENDING ON CLINICAL CONDITION.   Demetrios Loll M.D on 01/08/2017 at 5:59 PM  Between 7am to 6pm - Pager - [phone]  After 6pm go to [email or web address] - [facility]

## 2017-01-08 NOTE — Progress Notes (Signed)
[facility] responded to an OR for room 130. Pt was in bed eating breakfast. Husband is bedside. Pt stated that she has panic attacks. She is afraid to leave her house. Pt stated that she feels safe here and therefor is feeling better. Pt requested a reading from John:14. Pt asked for specific prayer concerning the fluid build up in her legs and around her lungs. CH provided prayer and empathetic listening.CH is available for follow up as needed.    01/08/17 0900  Clinical Encounter Type  Visited With Patient;Patient and family together  Visit Type Initial;Spiritual support  Referral From Nurse  Consult/Referral To [place] text;Prayer

## 2017-01-09 LAB — BASIC METABOLIC PANEL
Anion gap: 9 (ref 5–15)
BUN: 26 mg/dL — AB (ref 6–20)
CHLORIDE: 95 mmol/L — AB (ref 101–111)
CO2: 33 mmol/L — ABNORMAL HIGH (ref 22–32)
CREATININE: 1.94 mg/dL — AB (ref 0.44–1.00)
Calcium: 8.2 mg/dL — ABNORMAL LOW (ref 8.9–10.3)
GFR calc Af Amer: 35 mL/min — ABNORMAL LOW (ref >60)
GFR, EST NON AFRICAN AMERICAN: 30 mL/min — AB (ref >60)
GLUCOSE: 218 mg/dL — AB (ref 65–99)
POTASSIUM: 3.9 mmol/L (ref 3.5–5.1)
Sodium: 137 mmol/L (ref 135–145)

## 2017-01-09 LAB — GLUCOSE, CAPILLARY
GLUCOSE-CAPILLARY: 174 mg/dL — AB (ref 65–99)
GLUCOSE-CAPILLARY: 258 mg/dL — AB (ref 65–99)
Glucose-Capillary: 220 mg/dL — ABNORMAL HIGH (ref 65–99)
Glucose-Capillary: 238 mg/dL — ABNORMAL HIGH (ref 65–99)

## 2017-01-09 LAB — HIV ANTIBODY (ROUTINE TESTING W REFLEX): HIV Screen 4th Generation wRfx: NONREACTIVE

## 2017-01-09 LAB — MAGNESIUM: Magnesium: 1.6 mg/dL — ABNORMAL LOW (ref 1.7–2.4)

## 2017-01-09 MED ORDER — AZITHROMYCIN 500 MG PO TABS
500.0000 mg | ORAL_TABLET | Freq: Every day | ORAL | Status: DC
  Administered 2017-01-09: 21:00:00 500 mg via ORAL
  Filled 2017-01-09: qty 1

## 2017-01-09 MED ORDER — MAGNESIUM SULFATE 2 GM/50ML IV SOLN
2.0000 g | Freq: Once | INTRAVENOUS | Status: AC
  Administered 2017-01-09: 19:00:00 2 g via INTRAVENOUS
  Filled 2017-01-09: qty 50

## 2017-01-09 NOTE — Progress Notes (Signed)
[facility] [place] NAME: [name]    MR#:  [id number]  DATE OF BIRTH:  16-Jun-1970  SUBJECTIVE:  CHIEF COMPLAINT:   Chief Complaint  Patient presents with  . Shortness of Breath   Better shortness of breath and the cough.  On O2 [place] 4 L. REVIEW OF SYSTEMS:  Review of Systems  Constitutional: Positive for malaise/fatigue. Negative for chills and fever.  HENT: Negative for sore throat.   Eyes: Negative for blurred vision and double vision.  Respiratory: Positive for cough and shortness of breath. Negative for hemoptysis, wheezing and stridor.   Cardiovascular: Negative for chest pain, palpitations, orthopnea and leg swelling.  Gastrointestinal: Negative for abdominal pain, blood in stool, diarrhea, melena, nausea and vomiting.  Genitourinary: Negative for dysuria, flank pain and hematuria.  Musculoskeletal: Negative for back pain and joint pain.  Skin: Negative for rash.  Neurological: Positive for weakness. Negative for dizziness, sensory change, focal weakness, seizures, loss of consciousness and headaches.  Endo/Heme/Allergies: Negative for polydipsia.  Psychiatric/Behavioral: Negative for depression. The patient is not nervous/anxious.     DRUG ALLERGIES:   Allergies  Allergen Reactions  . Dust Mite Extract Shortness Of Breath and Itching  . Mold Extract [Trichophyton] Shortness Of Breath and Itching  . Feraheme [Ferumoxytol] Itching  . Pollen Extract   . Sulfa Antibiotics Itching   VITALS:  Blood pressure 125/73, pulse 73, temperature 98 F (36.7 C), temperature source Oral, resp. rate 20, height 5\' 4"  (1.626 m), weight (!) 352 lb 12.8 oz (160 kg), SpO2 97 %. PHYSICAL EXAMINATION:  Physical Exam  Constitutional: She is oriented to person, place, and time and well-developed, well-nourished, and in no distress.  Morbid obesity.  HENT:  Head: Normocephalic.  Mouth/Throat: Oropharynx is clear and moist.  Eyes:  Conjunctivae and EOM are normal. Pupils are equal, round, and reactive to light. No scleral icterus.  Neck: Normal range of motion. Neck supple. No JVD present. No tracheal deviation present.  Cardiovascular: Normal rate, regular rhythm and normal heart sounds. Exam reveals no gallop.  No murmur heard. Pulmonary/Chest: Effort normal. No respiratory distress. She has no wheezes. She has no rales.  Diminished lung sounds.  Abdominal: Soft. Bowel sounds are normal. She exhibits no distension. There is no tenderness. There is no rebound.  Musculoskeletal: Normal range of motion. She exhibits no edema or tenderness.  Neurological: She is alert and oriented to person, place, and time. No cranial nerve deficit.  Skin: No rash noted. No erythema.  Psychiatric: Affect normal.   LABORATORY PANEL:  Female CBC Recent Labs  Lab 01/08/17 0711  WBC 4.6  HGB 10.4*  HCT 33.3*  PLT 296   ------------------------------------------------------------------------------------------------------------------ Chemistries  Recent Labs  Lab 01/07/17 1403  01/09/17 0418  NA 139   < > 137  K 4.6   < > 3.9  CL 95*   < > 95*  CO2 34*   < > 33*  GLUCOSE 178*   < > 218*  BUN 23*   < > 26*  CREATININE 1.57*   < > 1.94*  CALCIUM 8.2*   < > 8.2*  MG 1.5*   < > 1.6*  AST 31  --   --   ALT 14  --   --   ALKPHOS 149*  --   --   BILITOT 1.0  --   --    < > = values in this interval not displayed.   RADIOLOGY:  No results found. ASSESSMENT AND PLAN:   Acute on chronic respiratory failure due to pneumonia and right-sided pleural effusion. Try to wean down oxygen by nasal cannula, nebulizer as needed, continue Zithromax and Rocephin, Robitussin as needed.  Hypomagnesemia.  Given IV magnesium and follow-up level.  Elevated troponin.  Due to demanding ischemia. Continue aspirin.  COPD.  Stable continue home nebulizer. Diabetes.  on sliding scale  Chronic diastolic CHF.  Stable.  Continue torsemide.    Hypertension.  Continue home hypertension medication. CKD stage III.    Creatinine is elevated to 1.94, at about baseline.  Follow-up BMP. Anemia of chronic disease.  Hemoglobin is stable. Morbid obesity and OSA. CPAP at night.  All the records are reviewed and case discussed with Care Management/Social Worker. Management plans discussed with the patient, her husband and they are in agreement.  CODE STATUS: Full Code  TOTAL TIME TAKING CARE OF THIS PATIENT: 33 minutes.   More than 50% of the time was spent in counseling/coordination of care: YES  POSSIBLE D/C IN 2 DAYS, DEPENDING ON CLINICAL CONDITION.   Demetrios Loll M.D on 01/09/2017 at 4:19 PM  Between 7am to 6pm - Pager - [phone]  After 6pm go to [email or web address] - [facility]

## 2017-01-09 NOTE — Plan of Care (Signed)
BP elevated during shift, upon recheck before administering PRN IV Hydralazine, BP did not meet parameters.  Received scheduled anti-hypertensives, no add'l interventions needed.  Reported generalized pain 8/10, unchanged post-PRN PO Tylenol 650mg .  Received PRN PO Vicodin 10-650mg , asleep on reassessment.  No other complaints.  Husband at bedside, call bell within reach.  Bed in low position.  WCTM.

## 2017-01-09 NOTE — Progress Notes (Signed)
Medications administered by student RN [id number] with supervision of Clinical Instructor Ilea Hilton MSN, RN-BC or patient's assigned RN.   

## 2017-01-09 NOTE — Progress Notes (Signed)
Patient requested breathing treatment.  Given by RN

## 2017-01-09 NOTE — Progress Notes (Signed)
PT Cancellation Note  Patient Details Name: [name] MRN: [id number] DOB: 04-09-1970   Cancelled Treatment:    Reason Eval/Treat Not Completed: Patient declined, no reason specified;Patient at procedure or test/unavailable.  Pt currently having breathing treatment.  The pt also refuses to work with PT at this time saying she is too tired and requests that PT return at a later time.  Will attempt to see pt again later today, schedule permitting.   Collie Siad PT, DPT 01/09/2017, 11:14 AM

## 2017-01-09 NOTE — Evaluation (Signed)
Physical Therapy Evaluation Patient Details Name: [name] MRN: [id number] DOB: 1971/01/26 Today's Date: 01/09/2017   History of Present Illness  Pt is a 46 y/o F who presented with SOB and weakness.  Pt found to have pneumonia and R sided pleural effusion.  Pt's PMH includes COPD, chronic respiratory failure on home oxygen 3L, chronic diastolic CHF, CKD stage III, morbid obesity.    Clinical Impression  Pt admitted with above diagnosis. Pt currently with functional limitations due to the deficits listed below (see PT Problem List). [name] very pleasant and requested to move slowly with mobility as she becomes anxious and is more prone to panic attacks if she is pushed to move too quickly.  Pt currently requires min guard assist for transfers but declined attempting ambulation today due to BLE weakness and fatigue. SpO2 dropped as low as 84% on 4LO2 which took ~1-2 minutes to increase to 90% on 4L O2 once sitting.  Pt would benefit from a Bariatric WC as pt currently limited to ambulating up to 10 ft with rollator at baseline.  Pt will benefit from skilled PT to increase their independence and safety with mobility to allow discharge to the venue listed below.      Follow Up Recommendations Home health PT;Supervision/Assistance - 24 hour    Equipment Recommendations  Wheelchair (measurements PT);Wheelchair cushion (measurements PT)(Bariatric WC)    Recommendations for Other Services OT consult(Energy conservation techniques; ADLs)     Precautions / Restrictions Precautions Precautions: Fall;Other (comment) Precaution Comments: monitor O2 Restrictions Weight Bearing Restrictions: No      Mobility  Bed Mobility               General bed mobility comments: Pt in long sitting in bed upon PT arrival and sitting in chair at end of session.  She requires increased time to transition from long sitting to sitting EOB but no physical assist required.   Transfers Overall  transfer level: Needs assistance Equipment used: (Bariatric RW) Transfers: Sit to/from [facility] Sit to Stand: Min guard Stand pivot transfers: Min guard       General transfer comment: Cues for proper hand placement and safe technique.  Pt able to stand and pivot without signs of instability but fatigues with this and declines attempting ambulation.  SpO2 dropped as low as 84% on 4LO2 which took ~1-2 minutes to increase to 90% on 4L O2 once sitting.   Ambulation/Gait             General Gait Details: Unable to assess  Stairs            Wheelchair Mobility    Modified Rankin (Stroke Patients Only)       Balance Overall balance assessment: Needs assistance;History of Falls Sitting-balance support: No upper extremity supported;Feet supported Sitting balance-Leahy Scale: Good     Standing balance support: Bilateral upper extremity supported;During functional activity Standing balance-Leahy Scale: Poor Standing balance comment: Pt relies on UE support for static and dynamic activities                             Pertinent Vitals/Pain Pain Assessment: 0-10 Pain Score: 6  Pain Location: Chronic back pain, Bil hip pain, Bil knee pain Pain Descriptors / Indicators: Aching;Grimacing;Guarding Pain Intervention(s): Limited activity within patient's tolerance;Monitored during session;Repositioned;Relaxation;Utilized relaxation techniques    Home Living Family/patient expects to be discharged to:: Private residence Living Arrangements: Spouse/significant other;Children Available Help at Discharge:  Family;Personal care attendant Type of Home: House Home Access: Stairs to enter Entrance Stairs-Rails: None Entrance Stairs-Number of Steps: 4 Home Layout: One level Home Equipment: Walker - 4 wheels      Prior Function Level of Independence: Needs assistance   Gait / Transfers Assistance Needed: Pt able to ambulate up to 10 ft with [place] RW,  limited by fatigue and SOB.  Pt has had one fall in the past 6 months.    ADL's / Homemaking Assistance Needed: Personal care attendant comes 3 days/wk for 1 hr each time and assist with dressing and bathing (pt gets in shower to bathe).  Pt requires assist for bathing, dressing.  Husband does the cooking, cleaning, driving.  Comments: Pt reports that she spends all of her time in her bed.  She does not get up to a chair during the day.  Pt educated on the increased risk for bed sores.       Hand Dominance        Extremity/Trunk Assessment   Upper Extremity Assessment Upper Extremity Assessment: (BUE strength grossly 4-/5)    Lower Extremity Assessment Lower Extremity Assessment: (BLE strength grossly 3+/5)       Communication   Communication: No difficulties  Cognition Arousal/Alertness: Awake/alert("I'm sleepy because of my medicine") Behavior During Therapy: WFL for tasks assessed/performed;Anxious Overall Cognitive Status: Within Functional Limits for tasks assessed                                        General Comments General comments (skin integrity, edema, etc.): Pt reports she had a panic attack earlier today when getting up to the [facility].  She says she becomes very anxious when rushed but if she is allowed time to perform mobility tasks then she does fine.  This was reflected in her session today as pt remained very calm when given plenty of time.     Exercises     Assessment/Plan    PT Assessment Patient needs continued PT services  PT Problem List Decreased strength;Decreased activity tolerance;Decreased balance;Decreased mobility;Decreased knowledge of use of DME;Decreased safety awareness;Cardiopulmonary status limiting activity;Obesity;Pain       PT Treatment Interventions DME instruction;Gait training;Stair training;Functional mobility training;Therapeutic activities;Therapeutic exercise;Balance training;Neuromuscular  re-education;Patient/family education;Wheelchair mobility training    PT Goals (Current goals can be found in the Care Plan section)  Acute Rehab PT Goals Patient Stated Goal: to become more independent PT Goal Formulation: With patient Time For Goal Achievement: 01/23/17 Potential to Achieve Goals: Good    Frequency Min 2X/week   Barriers to discharge        Co-evaluation               AM-PAC PT "6 Clicks" Daily Activity  Outcome Measure Difficulty turning over in bed (including adjusting bedclothes, sheets and blankets)?: Unable Difficulty moving from lying on back to sitting on the side of the bed? : Unable Difficulty sitting down on and standing up from a chair with arms (e.g., wheelchair, bedside commode, etc,.)?: A Lot Help needed moving to and from a bed to chair (including a wheelchair)?: A Little Help needed walking in hospital room?: A Lot Help needed climbing 3-5 steps with a railing? : A Lot 6 Click Score: 11    End of Session Equipment Utilized During Treatment: Gait belt;Oxygen Activity Tolerance: Patient tolerated treatment well;Patient limited by fatigue Patient left: in chair;with call bell/phone within  reach;with chair alarm set;with family/visitor present Nurse Communication: Mobility status;Other (comment)(SpO2) PT Visit Diagnosis: Pain;Unsteadiness on feet (R26.81);Other abnormalities of gait and mobility (R26.89);Muscle weakness (generalized) (M62.81);Difficulty in walking, not elsewhere classified (R26.2);History of falling (Z91.81) Pain - Right/Left: (Middle) Pain - part of body: (Chronic back pain)    Time: 1155-2080 PT Time Calculation (min) (ACUTE ONLY): 24 min   Charges:   PT Evaluation $PT Eval Moderate Complexity: 1 Mod PT Treatments $Therapeutic Activity: 8-22 mins   PT G Codes:   PT G-Codes **NOT FOR INPATIENT CLASS** Functional Assessment Tool Used: AM-PAC 6 Clicks Basic Mobility;Clinical judgement Functional Limitation: Mobility:  Walking and moving around Mobility: Walking and Moving Around Current Status (E2336): At least 60 percent but less than 80 percent impaired, limited or restricted Mobility: Walking and Moving Around Goal Status [phone]): At least 20 percent but less than 40 percent impaired, limited or restricted    Collie Siad PT, DPT 01/09/2017, 4:42 PM

## 2017-01-10 LAB — BASIC METABOLIC PANEL
Anion gap: 9 (ref 5–15)
BUN: 32 mg/dL — AB (ref 6–20)
CHLORIDE: 95 mmol/L — AB (ref 101–111)
CO2: 33 mmol/L — AB (ref 22–32)
CREATININE: 2.23 mg/dL — AB (ref 0.44–1.00)
Calcium: 8.2 mg/dL — ABNORMAL LOW (ref 8.9–10.3)
GFR calc Af Amer: 29 mL/min — ABNORMAL LOW (ref >60)
GFR calc non Af Amer: 25 mL/min — ABNORMAL LOW (ref >60)
Glucose, Bld: 183 mg/dL — ABNORMAL HIGH (ref 65–99)
Potassium: 4.3 mmol/L (ref 3.5–5.1)
Sodium: 137 mmol/L (ref 135–145)

## 2017-01-10 LAB — MAGNESIUM: Magnesium: 2 mg/dL (ref 1.7–2.4)

## 2017-01-10 LAB — GLUCOSE, CAPILLARY
Glucose-Capillary: 160 mg/dL — ABNORMAL HIGH (ref 65–99)
Glucose-Capillary: 174 mg/dL — ABNORMAL HIGH (ref 65–99)

## 2017-01-10 MED ORDER — AZITHROMYCIN 500 MG PO TABS: 500.0000 mg | ORAL_TABLET | Freq: Every day | ORAL | 0 refills | Status: DC

## 2017-01-10 MED ORDER — GUAIFENESIN-DM 100-10 MG/5ML PO SYRP: 5.0000 mL | ORAL_SOLUTION | ORAL | 0 refills | Status: DC | PRN

## 2017-01-10 MED ORDER — SODIUM CHLORIDE 0.9 % IV SOLN: INTRAVENOUS | Status: DC

## 2017-01-10 NOTE — Plan of Care (Signed)
  Progressing Education: Knowledge of General Education information will improve 01/10/2017 1203 - Progressing by Rowe Robert, RN Health Behavior/Discharge Planning: Ability to manage health-related needs will improve 01/10/2017 1203 - Progressing by Rowe Robert, RN Clinical Measurements: Ability to maintain clinical measurements within normal limits will improve 01/10/2017 1203 - Progressing by Rowe Robert, RN Will remain free from infection 01/10/2017 1203 - Progressing by Rowe Robert, RN Diagnostic test results will improve 01/10/2017 1203 - Progressing by Rowe Robert, RN Respiratory complications will improve 01/10/2017 1203 - Progressing by Rowe Robert, RN Cardiovascular complication will be avoided 01/10/2017 1203 - Progressing by Rowe Robert, RN Activity: Risk for activity intolerance will decrease 01/10/2017 1203 - Progressing by Rowe Robert, RN Nutrition: Adequate nutrition will be maintained 01/10/2017 1203 - Progressing by Rowe Robert, RN Coping: Level of anxiety will decrease 01/10/2017 1203 - Progressing by Rowe Robert, RN Elimination: Will not experience complications related to bowel motility 01/10/2017 1203 - Progressing by Rowe Robert, RN Will not experience complications related to urinary retention 01/10/2017 1203 - Progressing by Rowe Robert, RN Pain Managment: General experience of comfort will improve 01/10/2017 1203 - Progressing by Rowe Robert, RN Safety: Ability to remain free from injury will improve 01/10/2017 1203 - Progressing by Rowe Robert, RN Skin Integrity: Risk for impaired skin integrity will decrease 01/10/2017 1203 - Progressing by Rowe Robert, RN Respiratory: Ability to maintain adequate ventilation will improve 01/10/2017 1203 - Progressing by Rowe Robert, RN Ability to maintain a clear airway will improve 01/10/2017 1203 - Progressing by Rowe Robert, RN

## 2017-01-10 NOTE — Care Management (Signed)
Discharge to home today per Dr. Bridgett Larsson. Prescriptions faxed to Medication Management Lurena Nida at [facility] updated Shelbie Ammons RN MSN [place] management [phone]

## 2017-01-10 NOTE — Discharge Summary (Signed)
[facility] [place] NAME: [name]    MR#:  [id number]  DATE OF BIRTH:  03-21-1970  DATE OF ADMISSION:  01/07/2017   ADMITTING PHYSICIAN: Demetrios Loll, MD  DATE OF DISCHARGE: No discharge date for patient encounter.  PRIMARY CARE PHYSICIAN: [name], NP   ADMISSION DIAGNOSIS:  CO2 narcosis [R06.89] Dyspnea, unspecified type [R06.00] Community acquired pneumonia, unspecified laterality [J18.9] DISCHARGE DIAGNOSIS:  Active Problems:   Pneumonia  SECONDARY DIAGNOSIS:   Past Medical History:  Diagnosis Date  . (HFpEF) heart failure with preserved ejection fraction ([place])    a. 11/2014 Echo: EF 50-55%, Gr1 DD; b. 06/2016 Echo: EF 60-65%, no rwma, mod dil LA, nl RV, triv eff.  . Asthma   . CKD (chronic kidney disease), stage III ([place])   . COPD (chronic obstructive pulmonary disease) ([place])    a. Home O2.  . Diabetes mellitus without complication ([place])   . Hypertension   . Iron deficiency anemia    a. chronic blood loss->heavy menses.  . Morbid obesity ([place])   . Pericardial effusion    a. 11/2014 CT Chest: Mod effusion; b. 11/2014 Echo: small to mod circumferential effusion. No hemodynamic compromise.  [name] Poorly controlled diabetes mellitus ([place])    a. 11/2014 A1c 13.2.   HOSPITAL COURSE:   Acute on chronic respiratory failure due to pneumonia and right-sided pleural effusion. Continue home oxygen by nasal cannula, nebulizer as needed, treated with Zithromax and Rocephin, Robitussin as needed.  Continue p.o. Zithromax for 3 more days.  Hypomagnesemia.  Given IV magnesium and improved.   Elevated troponin. Due to demanding ischemia. Continue aspirin.  COPD. Stable continue home nebulizer. Diabetes. on sliding scale  Chronic diastolic CHF.  Stable.  Continue torsemide.  Hypertension. Continue home hypertension medication. CKD stage III.   Creatinine is elevated to 2.23, around baseline.  Follow-up BMP as  outpatient. Anemia of chronic disease.  Hemoglobin is stable. Morbid obesity and OSA. CPAP at night. DISCHARGE CONDITIONS:  Stable, discharged to home with home health and PT today. CONSULTS OBTAINED:   DRUG ALLERGIES:   Allergies  Allergen Reactions  . Dust Mite Extract Shortness Of Breath and Itching  . Mold Extract [Trichophyton] Shortness Of Breath and Itching  . Feraheme [Ferumoxytol] Itching  . Pollen Extract   . Sulfa Antibiotics Itching   DISCHARGE MEDICATIONS:   Allergies as of 01/10/2017      Reactions   Dust Mite Extract Shortness Of Breath, Itching   Mold Extract [trichophyton] Shortness Of Breath, Itching   Feraheme [ferumoxytol] Itching   Pollen Extract    Sulfa Antibiotics Itching      Medication List    TAKE these medications   acetaminophen 500 MG tablet Commonly known as:  TYLENOL Take 1,000 mg by mouth every 6 (six) hours as needed for mild pain, fever or headache.   albuterol (2.5 MG/3ML) 0.083% nebulizer solution Commonly known as:  PROVENTIL Take 2.5 mg by nebulization every 6 (six) hours as needed for wheezing or shortness of breath.   albuterol 108 (90 Base) MCG/ACT inhaler Commonly known as:  PROVENTIL HFA;VENTOLIN HFA Inhale 2 puffs into the lungs every 6 (six) hours as needed for wheezing or shortness of breath.   aspirin EC 81 MG tablet Take 81 mg by mouth daily.   atorvastatin 80 MG tablet Commonly known as:  LIPITOR Take 1 tablet (80 mg total) by mouth daily at 6 PM.   azithromycin 500 MG tablet  Commonly known as:  ZITHROMAX Take 1 tablet (500 mg total) daily by mouth.   budesonide 0.25 MG/2ML nebulizer solution Commonly known as:  PULMICORT Take 2 mLs (0.25 mg total) by nebulization 2 (two) times daily.   citalopram 40 MG tablet Commonly known as:  CELEXA Take 1 tablet (40 mg total) by mouth daily.   guaiFENesin-dextromethorphan 100-10 MG/5ML syrup Commonly known as:  ROBITUSSIN DM Take 5 mLs every 4 (four) hours as needed  by mouth for cough.   HM VITAMIN D3 4000 units Caps Generic drug:  Cholecalciferol Take 1 capsule by mouth once a week.   hydrALAZINE 100 MG tablet Commonly known as:  APRESOLINE Take 1 tablet (100 mg total) by mouth every 8 (eight) hours.   insulin aspart 100 UNIT/ML FlexPen Commonly known as:  NOVOLOG FLEXPEN Inject 0-12 Units into the skin 3 (three) times daily with meals as needed for high blood sugar. Pt uses as needed per sliding scale.   losartan 50 MG tablet Commonly known as:  COZAAR Take 50 mg daily by mouth.   metoprolol tartrate 100 MG tablet Commonly known as:  LOPRESSOR Take 1 tablet (100 mg total) by mouth 2 (two) times daily.   polyethylene glycol packet Commonly known as:  MIRALAX / GLYCOLAX Take 17 g by mouth daily as needed for mild constipation.   potassium chloride 10 MEQ tablet Commonly known as:  K-DUR Take 1 tablet (10 mEq total) by mouth 2 (two) times daily.   torsemide 20 MG tablet Commonly known as:  DEMADEX Take 2 tablets (40 mg total) by mouth daily.        DISCHARGE INSTRUCTIONS:  See AVS.  If you experience worsening of your admission symptoms, develop shortness of breath, life threatening emergency, suicidal or homicidal thoughts you must seek medical attention immediately by calling 911 or calling your MD immediately  if symptoms less severe.  You Must read complete instructions/literature along with all the possible adverse reactions/side effects for all the Medicines you take and that have been prescribed to you. Take any new Medicines after you have completely understood and accpet all the possible adverse reactions/side effects.   Please note  You were cared for by a hospitalist during your hospital stay. If you have any questions about your discharge medications or the care you received while you were in the hospital after you are discharged, you can call the unit and asked to speak with the hospitalist on call if the hospitalist  that took care of you is not available. Once you are discharged, your primary care physician will handle any further medical issues. Please note that NO REFILLS for any discharge medications will be authorized once you are discharged, as it is imperative that you return to your primary care physician (or establish a relationship with a primary care physician if you do not have one) for your aftercare needs so that they can reassess your need for medications and monitor your lab values.    On the day of Discharge:  VITAL SIGNS:  Blood pressure (!) 154/100, pulse 100, temperature 98.1 F (36.7 C), temperature source Oral, resp. rate (!) 24, height 5\' 4"  (1.626 m), weight (!) 352 lb 12.8 oz (160 kg), SpO2 98 %. PHYSICAL EXAMINATION:  GENERAL:  46 y.o.-year-old patient lying in the bed with no acute distress.  Morbid obesity. EYES: Pupils equal, round, reactive to light and accommodation. No scleral icterus. Extraocular muscles intact.  HEENT: Head atraumatic, normocephalic. Oropharynx and nasopharynx clear.  NECK:  Supple, no jugular venous distention. No thyroid enlargement, no tenderness.  LUNGS: Diminished lung sounds, no wheezing, rales,rhonchi or crepitation. No use of accessory muscles of respiration.  CARDIOVASCULAR: S1, S2 normal. No murmurs, rubs, or gallops.  ABDOMEN: Soft, non-tender, non-distended. Bowel sounds present. No organomegaly or mass.  EXTREMITIES: No cyanosis, or clubbing.  Trace leg edema. NEUROLOGIC: Cranial nerves II through XII are intact. Muscle strength 3-4/5 in all extremities. Sensation intact. Gait not checked.  PSYCHIATRIC: The patient is alert and oriented x 3.  SKIN: No obvious rash, lesion, or ulcer.  DATA REVIEW:   CBC Recent Labs  Lab 01/08/17 0711  WBC 4.6  HGB 10.4*  HCT 33.3*  PLT 296    Chemistries  Recent Labs  Lab 01/07/17 1403  01/10/17 0515  NA 139   < > 137  K 4.6   < > 4.3  CL 95*   < > 95*  CO2 34*   < > 33*  GLUCOSE 178*   < >  183*  BUN 23*   < > 32*  CREATININE 1.57*   < > 2.23*  CALCIUM 8.2*   < > 8.2*  MG 1.5*   < > 2.0  AST 31  --   --   ALT 14  --   --   ALKPHOS 149*  --   --   BILITOT 1.0  --   --    < > = values in this interval not displayed.     Microbiology Results  Results for orders placed or performed during the hospital encounter of 01/07/17  Culture, blood (routine x 2)     Status: None (Preliminary result)   Collection Time: 01/07/17  2:25 PM  Result Value Ref Range Status   Specimen Description BLOOD LAC  Final   Special Requests   Final    BOTTLES DRAWN AEROBIC AND ANAEROBIC Blood Culture adequate volume   Culture NO GROWTH 3 DAYS  Final   Report Status PENDING  Incomplete  Culture, blood (routine x 2)     Status: None (Preliminary result)   Collection Time: 01/07/17  2:25 PM  Result Value Ref Range Status   Specimen Description BLOOD LEFT ARM  Final   Special Requests   Final    BOTTLES DRAWN AEROBIC AND ANAEROBIC Blood Culture adequate volume   Culture NO GROWTH 3 DAYS  Final   Report Status PENDING  Incomplete  MRSA PCR Screening     Status: None   Collection Time: 01/07/17  7:21 PM  Result Value Ref Range Status   MRSA by PCR NEGATIVE NEGATIVE Final    Comment:        The GeneXpert MRSA Assay (FDA approved for NASAL specimens only), is one component of a comprehensive MRSA colonization surveillance program. It is not intended to diagnose MRSA infection nor to guide or monitor treatment for MRSA infections.     RADIOLOGY:  No results found.   Management plans discussed with the patient, her husband and they are in agreement.  CODE STATUS: Full Code   TOTAL TIME TAKING CARE OF THIS PATIENT: 38 minutes.    Demetrios Loll M.D on 01/10/2017 at 2:44 PM  Between 7am to 6pm - Pager - [phone]  After 6pm go to [email or web address] - Technical bre[employer] [facility]  Office  [phone]  CC: Primary care physician; [name],  NP   Note: This dictation was prepared with Dragon dictation along with smaller phrase technology.  Any transcriptional errors that result from this process are unintentional.

## 2017-01-10 NOTE — Progress Notes (Signed)
Inpatient Diabetes Program Recommendations  AACE/ADA: New Consensus Statement on Inpatient Glycemic Control (2015)  Target Ranges:  Prepandial:   less than 140 mg/dL      Peak postprandial:   less than 180 mg/dL (1-2 hours)      Critically ill patients:  140 - 180 mg/dL   Lab Results  Component Value Date   GLUCAP 160 (H) 01/10/2017   HGBA1C 7.2 (H) 11/09/2016    Review of Glycemic Control  Results for Spohr, [name] (MRN [id number]) as of 01/10/2017 10:45  Ref. Range 01/09/2017 07:58 01/09/2017 12:11 01/09/2017 16:57 01/09/2017 21:02 01/10/2017 08:09  Glucose-Capillary Latest Ref Range: 65 - 99 mg/dL 220 (H) 174 (H) 238 (H) 258 (H) 160 (H)    Diabetes history: Type 2 Outpatient Diabetes medications: Novolog 0-12 units tid, Tresiba 36 units qhs- confirmed with patient  Current orders for Inpatient glycemic control:  Novolog 0-9 units tid, Novolog 0-5 units qhs  Inpatient Diabetes Program Recommendations: Consider adding Novolog 3 units tid with meals. (hold if patient eats less than 50%)- continue Novolog correction as ordered.  [name] Fitz, RN, BA, MHA, CDE Diabetes Coordinator Inpatient Diabetes Program  [phone] (Team Pager) [phone] ([place]) 01/10/2017 10:46 AM

## 2017-01-10 NOTE — Evaluation (Signed)
Occupational Therapy Evaluation Patient Details Name: NIKKIA DEVOSS MRN: [id number] DOB: 08/27/1970 Today's Date: 01/10/2017    History of Present Illness Pt is a 46 y.o. female who was admitted to [facility] with pneumonia and R sided pleural effusion.  Pt's PMH includes COPD, chronic respiratory failure on home oxygen 3L, chronic diastolic CHF, CKD stage III, and morbid obesity.   Clinical Impression   Pt. is a 46 y.o. female who was admitted to [facility] with pneumonia, and right sided pleural effusion. Pt. presents with weakness, decreased activity tolerance, and pain which limit her ability to complete ADL tasks. Pt. resides at home her her husband, and family. Pt. aide personal care aides 3x's a week who assist with ADL tasks approximately 1 hour a day. Pt.'s husband assist with IADL tasks, cooking, and driving. Pt. Education was provided about A/E, work simplification techniques, energy conservation techniques, DME, and A/E. Pt. Reports she is ready to discharge home, and is waiting for her husband to give her a ride home.     No OT follow up    Equipment Recommendations       Recommendations for Other Services       Precautions / Restrictions               ADL either performed or assessed with clinical judgement   ADL Overall ADL's : Needs assistance/impaired Eating/Feeding: Set up   Grooming: Set up   Upper Body Bathing: Moderate assistance   Lower Body Bathing: Maximal assistance   Upper Body Dressing : Moderate assistance   Lower Body Dressing: Maximal assistance                 General ADL Comments: Pt. education was provided about energy conservation, work simplification strategies, A/E, and DME.     Vision         Perception     Praxis      Pertinent Vitals/Pain Pain Assessment: 0-10 Pain Score: 6  Pain Location: Chronic back pain Pain Intervention(s): Limited activity within patient's tolerance;Monitored during session     Hand Dominance      Extremity/Trunk Assessment Upper Extremity Assessment Upper Extremity Assessment: Generalized weakness           Communication Communication Communication: No difficulties   Cognition Arousal/Alertness: Awake/alert Behavior During Therapy: WFL for tasks assessed/performed Overall Cognitive Status: Within Functional Limits for tasks assessed                                     General Comments       Exercises     Shoulder Instructions      Home Living Family/patient expects to be discharged to:: Private residence Living Arrangements: Spouse/significant other;Children Available Help at Discharge: Family;Personal care attendant Type of Home: House Home Access: Stairs to enter Technical brewer of Steps: 4 Entrance Stairs-Rails: None Home Layout: One level     Bathroom Shower/Tub: Tub/shower unit;Curtain         Home Equipment: Environmental consultant - 4 wheels          Prior Functioning/Environment Level of Independence: Needs assistance    ADL's / Homemaking Assistance Needed: Pt. requires assistance for all ADLs, IADLs. Pt. has a personal care aide 3 days a week, approximately one hour. Pt. husband assists with cooking, cleaning, and driving.            OT Problem List: Decreased strength;Decreased activity tolerance;Obesity;Pain  OT Treatment/Interventions:      OT Goals(Current goals can be found in the care plan section) Acute Rehab OT Goals Patient Stated Goal: To be independent OT Goal Formulation: With patient Potential to Achieve Goals: Good  OT Frequency:     Barriers to D/C:            Co-evaluation              AM-PAC PT "6 Clicks" Daily Activity     Outcome Measure Help from another person eating meals?: None Help from another person taking care of personal grooming?: None Help from another person toileting, which includes using toliet, bedpan, or urinal?: A Little Help from another person bathing (including  washing, rinsing, drying)?: A Lot Help from another person to put on and taking off regular upper body clothing?: A Little Help from another person to put on and taking off regular lower body clothing?: A Lot 6 Click Score: 18   End of Session    Activity Tolerance: Patient tolerated treatment well Patient left: in bed;with call bell/phone within reach;with bed alarm set  OT Visit Diagnosis: Muscle weakness (generalized) (M62.81)                Time: 1345-1400 OT Time Calculation (min): 15 min Charges:  OT General Charges $OT Visit: 1 Visit OT Evaluation $OT Eval Moderate Complexity: 1 Mod G-Codes: OT G-codes **NOT FOR INPATIENT CLASS** Functional Limitation: Self care Self Care Current Status (S5053): At least 20 percent but less than 40 percent impaired, limited or restricted Self Care Goal Status (Z7673): At least 1 percent but less than 20 percent impaired, limited or restricted   Harrel Carina, MS, OTR/L  Harrel Carina, MS, OTR/L 01/10/2017, 3:17 PM

## 2017-01-10 NOTE — Plan of Care (Signed)
  Progressing Education: Knowledge of General Education information will improve 01/10/2017 1332 - Progressing by Rowe Robert, RN 01/10/2017 1203 - Progressing by Rowe Robert, RN Health Behavior/Discharge Planning: Ability to manage health-related needs will improve 01/10/2017 1332 - Progressing by Rowe Robert, RN 01/10/2017 1203 - Progressing by Rowe Robert, RN Clinical Measurements: Ability to maintain clinical measurements within normal limits will improve 01/10/2017 1332 - Progressing by Rowe Robert, RN 01/10/2017 1203 - Progressing by Rowe Robert, RN Will remain free from infection 01/10/2017 1332 - Progressing by Rowe Robert, RN 01/10/2017 1203 - Progressing by Rowe Robert, RN Diagnostic test results will improve 01/10/2017 1332 - Progressing by Rowe Robert, RN 01/10/2017 1203 - Progressing by Rowe Robert, RN Respiratory complications will improve 01/10/2017 1332 - Progressing by Rowe Robert, RN 01/10/2017 1203 - Progressing by Rowe Robert, RN Cardiovascular complication will be avoided 01/10/2017 1332 - Progressing by Rowe Robert, RN 01/10/2017 1203 - Progressing by Rowe Robert, RN Activity: Risk for activity intolerance will decrease 01/10/2017 1332 - Progressing by Rowe Robert, RN 01/10/2017 1203 - Progressing by Rowe Robert, RN Nutrition: Adequate nutrition will be maintained 01/10/2017 1332 - Progressing by Rowe Robert, RN 01/10/2017 1203 - Progressing by Rowe Robert, RN Coping: Level of anxiety will decrease 01/10/2017 1332 - Progressing by Rowe Robert, RN 01/10/2017 1203 - Progressing by Rowe Robert, RN Elimination: Will not experience complications related to bowel motility 01/10/2017 1332 - Progressing by Rowe Robert, RN 01/10/2017 1203 - Progressing by Rowe Robert, RN Will not experience complications related to urinary retention 01/10/2017 1332 - Progressing by Rowe Robert, RN 01/10/2017  1203 - Progressing by Rowe Robert, RN Pain Managment: General experience of comfort will improve 01/10/2017 1332 - Progressing by Rowe Robert, RN 01/10/2017 1203 - Progressing by Rowe Robert, RN Safety: Ability to remain free from injury will improve 01/10/2017 1332 - Progressing by Rowe Robert, RN 01/10/2017 1203 - Progressing by Rowe Robert, RN Skin Integrity: Risk for impaired skin integrity will decrease 01/10/2017 1332 - Progressing by Rowe Robert, RN 01/10/2017 1203 - Progressing by Rowe Robert, RN Respiratory: Ability to maintain adequate ventilation will improve 01/10/2017 1332 - Progressing by Rowe Robert, RN 01/10/2017 1203 - Progressing by Rowe Robert, RN Ability to maintain a clear airway will improve 01/10/2017 1332 - Progressing by Rowe Robert, RN 01/10/2017 1203 - Progressing by Rowe Robert, RN

## 2017-01-10 NOTE — Progress Notes (Signed)
Pt for discharge home / a/o no resp distress. 02 3-4 l [place]. Pt on home 02  Spouse to bring 02 tank. presc to pt  Pt to take this over to med mang. At discharge. For med assist.  meds discussed. Diet activity and f/u discussed.verbalizes understanding of  Discharge plans. Sl d/cd. Pt to call husband to pick her up to take her home.

## 2017-01-10 NOTE — Discharge Instructions (Signed)
° °  Community-Acquired Pneumonia, Adult Pneumonia is an infection of the lungs. One type of pneumonia can happen while a person is in a hospital. A different type can happen when a person is not in a hospital (community-acquired pneumonia). It is easy for this kind to spread from person to person. It can spread to you if you breathe near an infected person who coughs or sneezes. Some symptoms include:  A dry cough.  A wet (productive) cough.  Fever.  Sweating.  Chest pain.  Follow these instructions at home:  Take over-the-counter and prescription medicines only as told by your doctor. ? Only take cough medicine if you are losing sleep. ? If you were prescribed an antibiotic medicine, take it as told by your doctor. Do not stop taking the antibiotic even if you start to feel better.  Sleep with your head and neck raised (elevated). You can do this by putting a few pillows under your head, or you can sleep in a recliner.  Do not use tobacco products. These include cigarettes, chewing tobacco, and e-cigarettes. If you need help quitting, ask your doctor.  Drink enough water to keep your pee (urine) clear or pale yellow. A shot (vaccine) can help prevent pneumonia. Shots are often suggested for:  People older than 45 years of age.  People older than 45 years of age: ? Who are having cancer treatment. ? Who have long-term (chronic) lung disease. ? Who have problems with their body's defense system (immune system).  You may also prevent pneumonia if you take these actions:  Get the flu (influenza) shot every year.  Go to the dentist as often as told.  Wash your hands often. If soap and water are not available, use hand sanitizer.  Contact a doctor if:  You have a fever.  You lose sleep because your cough medicine does not help. Get help right away if:  You are short of breath and it gets worse.  You have more chest pain.  Your sickness gets worse. This is very serious  if: ? You are an older adult. ? Your body's defense system is weak.  You cough up blood. This information is not intended to replace advice given to you by your health care provider. Make sure you discuss any questions you have with your health care provider. Document Released: 07/31/2007 Document Revised: 07/20/2015 Document Reviewed: 06/08/2014 Elsevier Interactive Patient Education  2018 [place] healthy and ADA diet. HHPT. Continue home O2 [place] 3L. Encourage oral intake.

## 2017-01-12 LAB — CULTURE, BLOOD (ROUTINE X 2)
Culture: NO GROWTH
Culture: NO GROWTH
Special Requests: ADEQUATE
Special Requests: ADEQUATE

## 2017-01-20 ENCOUNTER — Ambulatory Visit (INDEPENDENT_AMBULATORY_CARE_PROVIDER_SITE_OTHER): Payer: Medicaid Other | Admitting: Internal Medicine

## 2017-01-20 ENCOUNTER — Encounter: Payer: Self-pay | Admitting: Internal Medicine

## 2017-01-20 VITALS — BP 168/96 | HR 86 | Resp 16 | Ht 64.0 in | Wt 355.0 lb

## 2017-01-20 DIAGNOSIS — J984 Other disorders of lung: Secondary | ICD-10-CM | POA: Diagnosis not present

## 2017-01-20 DIAGNOSIS — J9611 Chronic respiratory failure with hypoxia: Secondary | ICD-10-CM | POA: Diagnosis not present

## 2017-01-20 DIAGNOSIS — J4550 Severe persistent asthma, uncomplicated: Secondary | ICD-10-CM

## 2017-01-20 DIAGNOSIS — R0602 Shortness of breath: Secondary | ICD-10-CM

## 2017-01-20 MED ORDER — IPRATROPIUM-ALBUTEROL 0.5-2.5 (3) MG/3ML IN SOLN: 3.0000 mL | Freq: Four times a day (QID) | RESPIRATORY_TRACT | 5 refills | Status: DC | PRN

## 2017-01-20 MED ORDER — FLUTICASONE-SALMETEROL 115-21 MCG/ACT IN AERO: 2.0000 | INHALATION_SPRAY | Freq: Two times a day (BID) | RESPIRATORY_TRACT | 12 refills | Status: DC

## 2017-01-20 MED ORDER — AMOXICILLIN-POT CLAVULANATE 875-125 MG PO TABS: 1.0000 | ORAL_TABLET | Freq: Every day | ORAL | 0 refills | Status: DC

## 2017-01-20 MED ORDER — PREDNISONE 10 MG (21) PO TBPK: ORAL_TABLET | ORAL | 0 refills | Status: DC

## 2017-01-20 NOTE — Progress Notes (Signed)
[name] Consultation     Synopsis: 46 year old female with morbid obesity, diastolic congestive heart failure, asthma/COPD with chronic hypoxic and hypercapnic respiratory failure, complicated by chronic kidney disease..   Assessment and Plan:  Asthma. -Patient is asked to use continue using nebulized duo nebs as needed, she is started on Advair generic 2 puffs twice daily.  Dyspnea with chronic hypoxic respiratory failure. -I suspect that the major driver of her dyspnea is morbid obesity, restrictive lung disease related to obesity, and pulmonary restriction from cardiomegaly.  Morbid obesity likely with OHS; BMI=61 was restrictive lung disease. -We discussed the possibility of starting on BiPAP for obesity hypoventilation, chronic hypercapnia, multiple readmissions.  Deconditioning. -Likely contributing to dyspnea. -We referred the patient back to pulmonary rehabilitation.  Chronic diastolic congestive heart failure with cardiomegaly, history of pericardial effusion. -Continue to follow up with cardiology.  Date: 01/20/2017  MRN# [id number] [name] May 07, 1970    [name] is a 46 y.o. old female seen in consultation for chief complaint of:    Chief Complaint  Patient presents with  . Hospitalization Follow-up    Asthma/COPD    HPI:   46 yo morbidly obese woman with uncontrolled diabetes with history of chronic diastolic CHF, hospitalization October 2016, November 2016 May 2018, September 2018, November 2018.  Patient was hospitalized earlier this month with right-sided pneumonia, hypercapnic respiratory failure and CO2 narcosis. She was discharged on 3L, which is up from 2L, she check her oxygen at home, and finds that it drops with minimal movement. She completed her abx.  She is using proair 2 puffs 5 times per day, sometimes it helps. She was on pulmicort nebs but stopped due to not liking the way it made her feel. She was on  duoneb, but stopped. She does not have insurance right now, so is on charity care.   She has been referred to pulmonary rehab but but did not follow-up.   I personally reviewed images chest x-ray 2 view 01/07/17 right basilar effusion, with atelectasis.  Morbid obesity with cardiomegaly.  Echocardiogram 12/15/14: EF equals 55%, mild left atrial dilation, small to moderate pericardial effusion, no TR noted.  Medication:   Reviewed.     Allergies:  Dust mite extract; Mold extract [trichophyton]; Feraheme [ferumoxytol]; Pollen extract; and Sulfa antibiotics  Review of Systems: Gen:  Denies  fever, sweats, chills HEENT: Denies blurred vision, double vision. bleeds, sore throat Cvc:  No dizziness, chest pain. Resp:   Denies cough or sputum production. Gi: Denies swallowing difficulty, stomach pain. Gu:  Denies bladder incontinence, burning urine Ext:   No Joint pain, stiffness. Skin: No skin rash,  hives  Endoc:  No polyuria, polydipsia. Psych: No depression, insomnia. Other:  All other systems were reviewed with the patient and were negative other that what is mentioned in the HPI.   Physical Examination:   VS: BP (!) 168/96 (BP Location: Left Arm, Cuff Size: Large)   Pulse 86   Resp 16   Ht 5\' 4"  (1.626 m)   Wt (!) 355 lb (161 kg)   LMP  (LMP Unknown) Comment: not on birth control, sporadic periods  SpO2 90%   BMI 60.94 kg/m   General Appearance: No distress  Neuro:without focal findings,  speech normal,  HEENT: PERRLA, EOM intact.   Pulmonary: normal breath sounds, No wheezing.  CardiovascularNormal S1,S2.  No m/r/g.   Abdomen: Benign, Soft, non-tender. Renal:  No costovertebral tenderness  GU:  No performed at this time.  Endoc: No evident thyromegaly, no signs of acromegaly. Skin:   warm, no rashes, no ecchymosis  Extremities: normal, no cyanosis, clubbing.  Other findings:    LABORATORY PANEL:   CBC No results for input(s): WBC, HGB, HCT, PLT in the last 168  hours. ------------------------------------------------------------------------------------------------------------------  Chemistries  No results for input(s): NA, K, CL, CO2, GLUCOSE, BUN, CREATININE, CALCIUM, MG, AST, ALT, ALKPHOS, BILITOT in the last 168 hours.  Invalid input(s): GFRCGP ------------------------------------------------------------------------------------------------------------------  Cardiac Enzymes No results for input(s): TROPONINI in the last 168 hours. ------------------------------------------------------------  RADIOLOGY:  No results found.     Thank  you for the consultation and for allowing [facility] to assist in the care of your patient. Our recommendations are noted above.  Please contact us if we can be of further service.   Marda Stalker, MD.  Board Certified in Internal [name], Pulmonary [name], [place], and Sleep [name].  [facility] Office Number: [phone]  Patricia Pesa, M.D.  Merton Border, M.D  01/20/2017

## 2017-01-20 NOTE — Patient Instructions (Signed)
--  Will need to see if we can get you on a home bipap (trilogy device) to use every night.

## 2017-01-21 ENCOUNTER — Telehealth: Payer: Self-pay | Admitting: *Deleted

## 2017-01-21 ENCOUNTER — Telehealth: Payer: Self-pay | Admitting: Internal Medicine

## 2017-01-21 ENCOUNTER — Telehealth: Payer: Self-pay

## 2017-01-21 MED ORDER — FLUTICASONE-SALMETEROL 115-21 MCG/ACT IN AERO: 2.0000 | INHALATION_SPRAY | Freq: Two times a day (BID) | RESPIRATORY_TRACT | 12 refills | Status: DC

## 2017-01-21 MED ORDER — ALBUTEROL SULFATE HFA 108 (90 BASE) MCG/ACT IN AERS: 2.0000 | INHALATION_SPRAY | Freq: Four times a day (QID) | RESPIRATORY_TRACT | 3 refills | Status: DC | PRN

## 2017-01-21 NOTE — Telephone Encounter (Signed)
Dr. Ashby Dawes wants patient to be set up on Bi-pap therapy. Pt is uninsured and alerted our office that she uses charity care thru [facility]. Rhonda called [facility] who says bipap can't be added to what patient currently gets under [facility] care because those were in-patient orders.  Patient was given application page for signature along with how to submit  $100 payment if she would like to use patient assistance program [place] uses for cpap/bipap. If patient decides to use program she will drop signature page of for [place] to proceed with order.

## 2017-01-21 NOTE — Telephone Encounter (Signed)
Patient called stating she feels like she is holding fluid. Her weight is 355 lbs and she is complaining of shortness of breath and edema in her legs. She states she did see the pulmonologist yesterday and was started on an antibiotic and prednisone. She wasn't sure if she should make any changes to her diuretic.    I spoke with [name] Price FNP in regards to the patient. She would like the patient to hold off on making any changes at this time. She would like [name] to contact the office tomorrow to report her weight, as it has not changed significantly since her discharge weight in 11/26. She also believes that the prednisone and antibiotic may start to relieve some of the shortness of breath that the patient is experiencing. We also may consider moving the patients current appointment of 12/13 to sooner, as well as making a change to the patients current dosage of torsemide based off the patients reported weight tomorrow.    I called [name] back and explained [name]'s instructions patient verbalized understanding and will call back in the morning to report weight.

## 2017-01-21 NOTE — Telephone Encounter (Signed)
°*  STAT* If patient is at the pharmacy, call can be transferred to refill team.   1. Which medications need to be refilled? (please list name of each medication and dose if known)   Advair 115-21 mcg/act inh 2 puffs BID   Albuterol  108 mcg inh 2 puff q 6 prn   2. Which pharmacy/location (including street and city if local pharmacy) is medication to be sent to?  [facility]   3. Do they need a 30 day or 90 day supply? [place]

## 2017-01-21 NOTE — Telephone Encounter (Signed)
Refill submitted.ss 

## 2017-01-23 ENCOUNTER — Telehealth: Payer: Self-pay

## 2017-01-23 MED ORDER — CLONIDINE HCL 0.2 MG PO TABS: 0.2000 mg | ORAL_TABLET | Freq: Two times a day (BID) | ORAL | 0 refills | Status: DC

## 2017-01-23 NOTE — Telephone Encounter (Signed)
[name] with [facility] contacted the office to report that [name] was having high blood pressure readings. She checked her blood pressure and it was 220/110. Patient denies any blurred vision, as well as headache. She called and left a message for Dr. Trish Mage office but had not received a reply.   I reviewed the medications with [name], patient is currently taking the following medications for blood pressure control, amlodipine 10 mg daily, Hydralazine 100 mg three times daily, Lopressor 100 mg twice daily as well as Losartan 50 mg.   Spoke with [name] in regards to phone call. [name] would like to add clonidine 0.2 mg twice daily and instructed that patient see her cardiologist tomorrow as [name] will not be in the office.   I contacted Dr. Trish Mage office an obtained an appointment for tomorrow at 2:45 pm. Advised that we are sending in the RX for patient.   I also contacted [name] to inform her of the changes. She will make sure that Ms. Buckle understands the instructions.

## 2017-01-24 ENCOUNTER — Telehealth: Payer: Self-pay

## 2017-01-24 NOTE — Telephone Encounter (Signed)
[name] with [facility] left a voicemail stating that [name] states that she will not be going to the appointment made with Dr. Humphrey Rolls due to not having insurance and she can not afford the co pay.   [name] will make another home visit to recheck blood pressure and will call us back.

## 2017-02-05 ENCOUNTER — Ambulatory Visit: Payer: Medicaid Other | Admitting: Pharmacy Technician

## 2017-02-06 ENCOUNTER — Ambulatory Visit: Payer: Self-pay | Admitting: Family

## 2017-02-12 ENCOUNTER — Ambulatory Visit: Payer: Medicaid Other | Admitting: Internal Medicine

## 2017-02-12 NOTE — Progress Notes (Deleted)
[facility] Consultation     Synopsis: 46 year old female with morbid obesity, diastolic congestive heart failure, asthma/COPD with chronic hypoxic and hypercapnic respiratory failure, complicated by chronic kidney disease..   Assessment and Plan:  Asthma. -Patient is asked to use continue using nebulized duo nebs as needed, she is started on Advair generic 2 puffs twice daily.  Dyspnea with chronic hypoxic respiratory failure. -I suspect that the major driver of her dyspnea is morbid obesity, restrictive lung disease related to obesity, and pulmonary restriction from cardiomegaly.  Morbid obesity likely with OHS; BMI=61 was restrictive lung disease. -We discussed the possibility of starting on BiPAP for obesity hypoventilation, chronic hypercapnia, multiple readmissions. --She does not have Pharmacist, community, therefore we were trying to see if she could get her bipap through charity care.   Deconditioning. -Likely contributing to dyspnea. -We referred the patient back to pulmonary rehabilitation.  Chronic diastolic congestive heart failure with cardiomegaly, history of pericardial effusion. -Continue to follow up with cardiology.  Date: 02/12/2017  MRN# [id number] [name] 26-Mar-1970    [name] is a 46 y.o. old female seen in consultation for chief complaint of:    No chief complaint on file.   HPI:   46 yo morbidly obese woman with uncontrolled diabetes with history of chronic diastolic CHF, hospitalization October 2016, November 2016 May 2018, September 2018, November 2018.  Patient was hospitalized earlier this month with right-sided pneumonia, hypercapnic respiratory failure and CO2 narcosis. She was discharged on 3L, which is up from 2L, she check her oxygen at home, and finds that it drops with minimal movement. She completed her abx.  She is using proair 2 puffs 5 times per day, sometimes it helps. She was on pulmicort nebs but  stopped due to not liking the way it made her feel. She was on duoneb, but stopped. She does not have insurance right now, so is on charity care.   She has been referred to pulmonary rehab but but did not follow-up.   I personally reviewed images chest x-ray 2 view 01/07/17 right basilar effusion, with atelectasis.  Morbid obesity with cardiomegaly.  Echocardiogram 12/15/14: EF equals 55%, mild left atrial dilation, small to moderate pericardial effusion, no TR noted.  Medication:   Reviewed.     Allergies:  Dust mite extract; Mold extract [trichophyton]; Feraheme [ferumoxytol]; Pollen extract; and Sulfa antibiotics  Review of Systems: Gen:  Denies  fever, sweats, chills HEENT: Denies blurred vision, double vision. bleeds, sore throat Cvc:  No dizziness, chest pain. Resp:   Denies cough or sputum production. Gi: Denies swallowing difficulty, stomach pain. Gu:  Denies bladder incontinence, burning urine Ext:   No Joint pain, stiffness. Skin: No skin rash,  hives  Endoc:  No polyuria, polydipsia. Psych: No depression, insomnia. Other:  All other systems were reviewed with the patient and were negative other that what is mentioned in the HPI.   Physical Examination:   VS: There were no vitals taken for this visit.  General Appearance: No distress  Neuro:without focal findings,  speech normal,  HEENT: PERRLA, EOM intact.   Pulmonary: normal breath sounds, No wheezing.  CardiovascularNormal S1,S2.  No m/r/g.   Abdomen: Benign, Soft, non-tender. Renal:  No costovertebral tenderness  GU:  No performed at this time. Endoc: No evident thyromegaly, no signs of acromegaly. Skin:   warm, no rashes, no ecchymosis  Extremities: normal, no cyanosis, clubbing.  Other findings:    LABORATORY PANEL:   CBC No results  for input(s): WBC, HGB, HCT, PLT in the last 168  hours. ------------------------------------------------------------------------------------------------------------------  Chemistries  No results for input(s): NA, K, CL, CO2, GLUCOSE, BUN, CREATININE, CALCIUM, MG, AST, ALT, ALKPHOS, BILITOT in the last 168 hours.  Invalid input(s): GFRCGP ------------------------------------------------------------------------------------------------------------------  Cardiac Enzymes No results for input(s): TROPONINI in the last 168 hours. ------------------------------------------------------------  RADIOLOGY:  No results found.     Thank  you for the consultation and for allowing [facility] to assist in the care of your patient. Our recommendations are noted above.  Please contact us if we can be of further service.   Marda Stalker, MD.  Board Certified in Internal Medicine, Pulmonary Medicine, [place], and Sleep Medicine.  [facility] Office Number: [phone]  Patricia Pesa, M.D.  Merton Border, M.D  02/12/2017

## 2017-02-14 NOTE — Progress Notes (Signed)
Patient scheduled for [facility] appointment at [facility].  Patient did not show for the appointment on February 05, 2017 at 2:00p.m.  Patient did not reschedule eligibility appointment.  [facility] unable to provide additional medication assistance until eligibility is determined.  [facility]

## 2017-02-19 IMAGING — US US RENAL
1 series · 14 of 25 positions shown · non-contrast
Comparison: CT [DATE].

CLINICAL DATA: Acute renal failure.  Chronic renal disease.

EXAM:
RENAL / URINARY TRACT ULTRASOUND COMPLETE

[Series 1: us renal · 0.31mm/px · 14 of 52 slices shown]
[im 1/52]
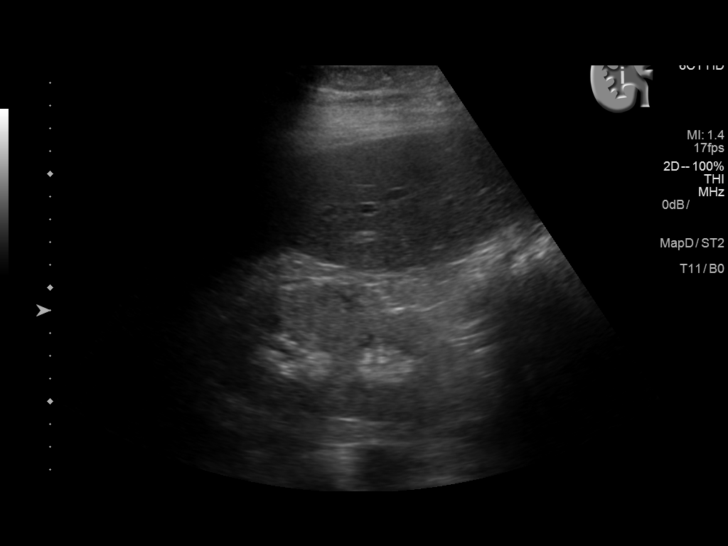
[im 5/52]
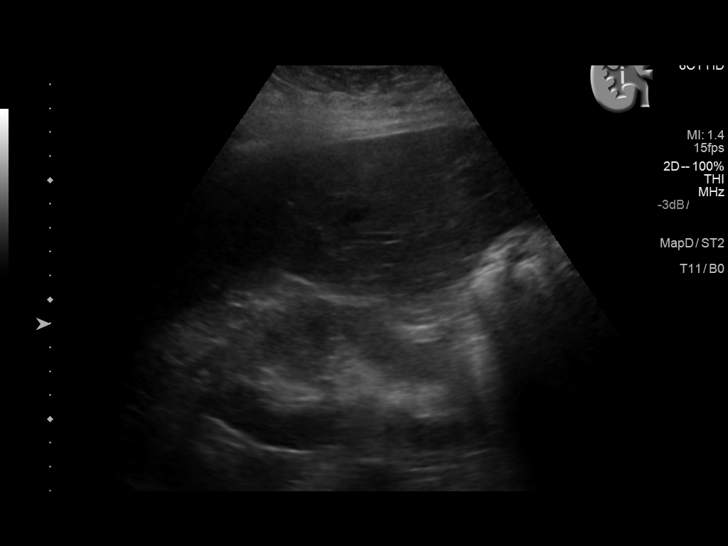
[im 9/52]
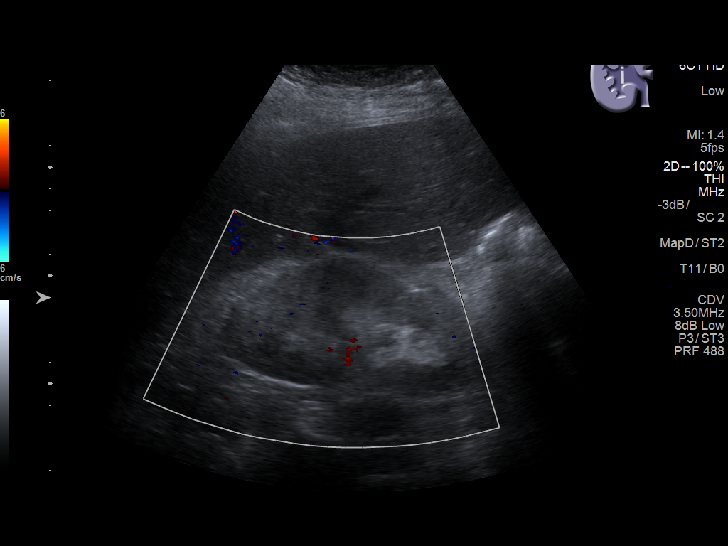
[im 13/52]
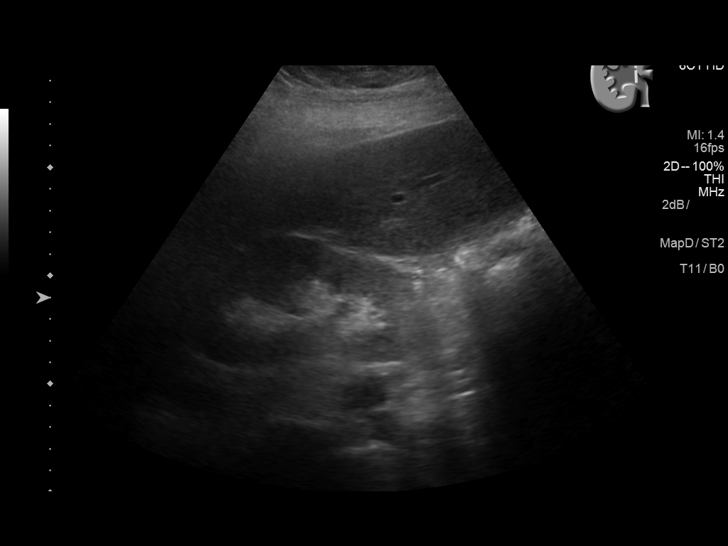
[im 18/52]
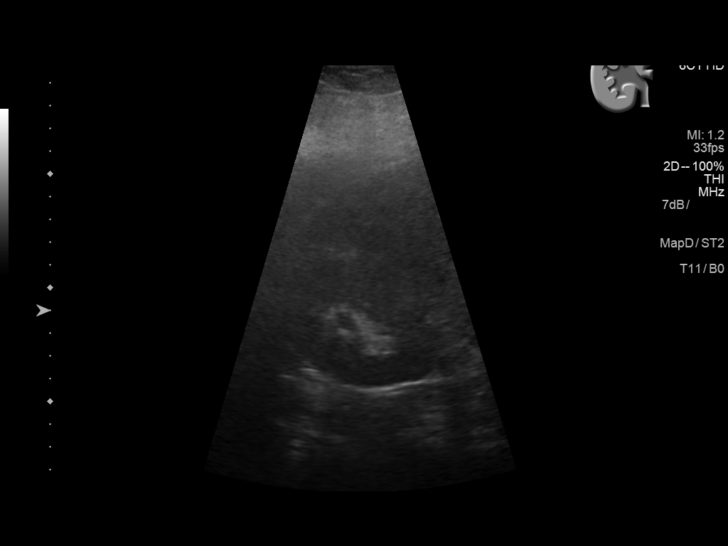
[im 20/52]
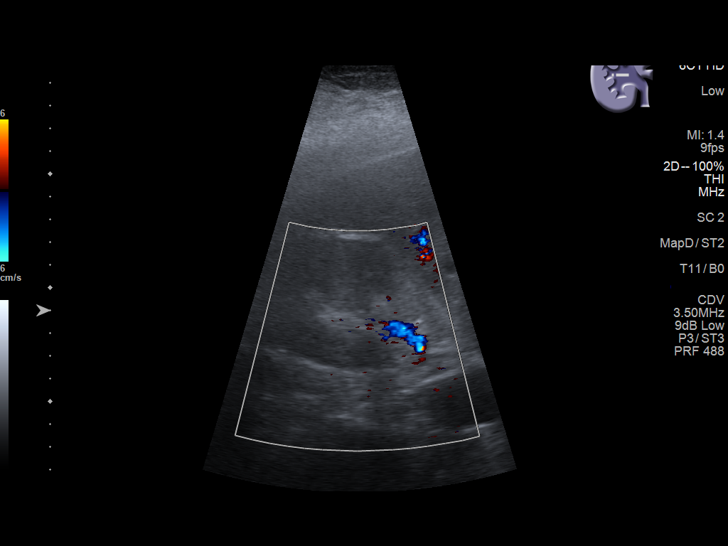
[im 24/52]
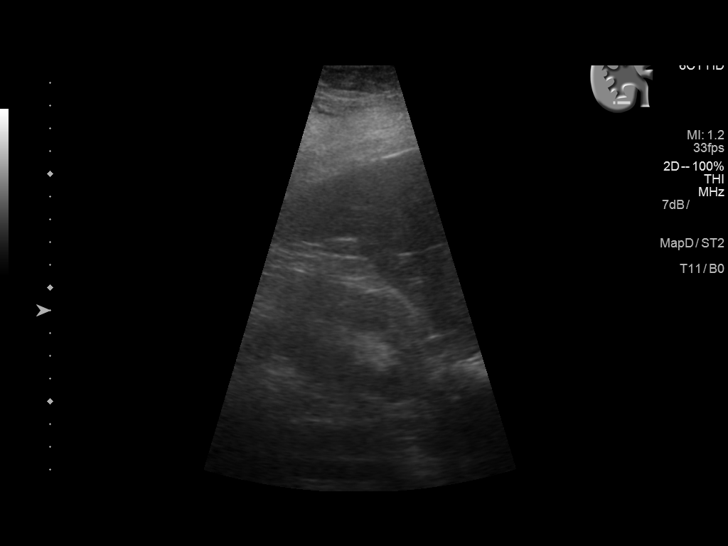
[im 28/52]
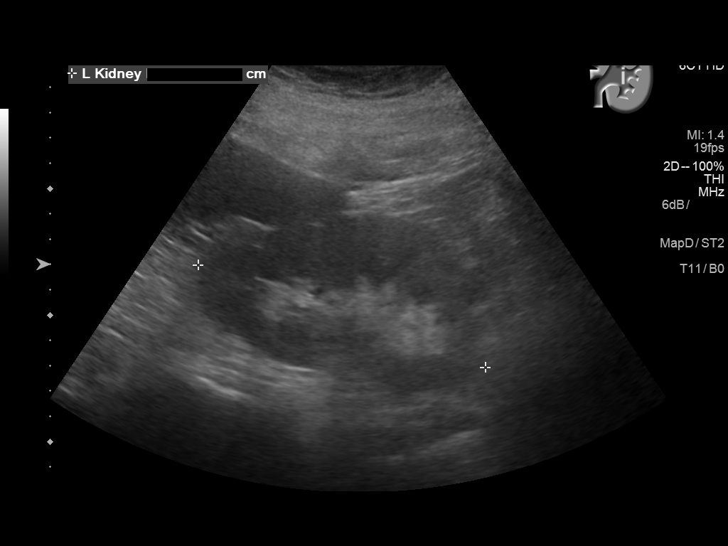
[im 32/52]
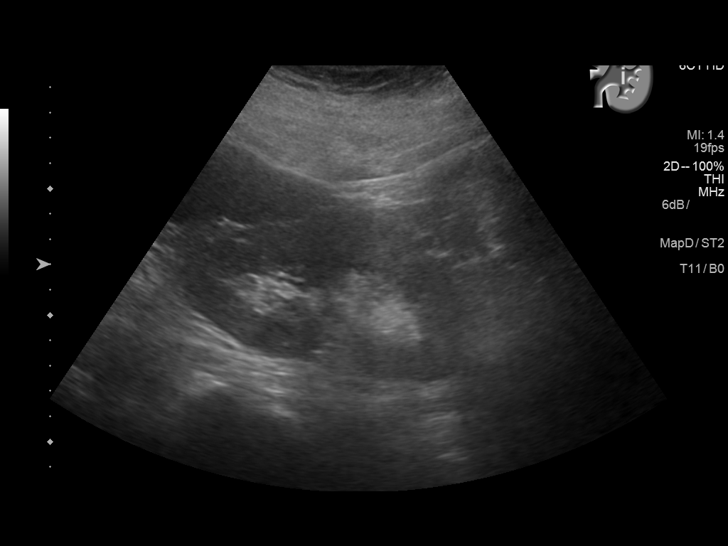
[im 35/52]
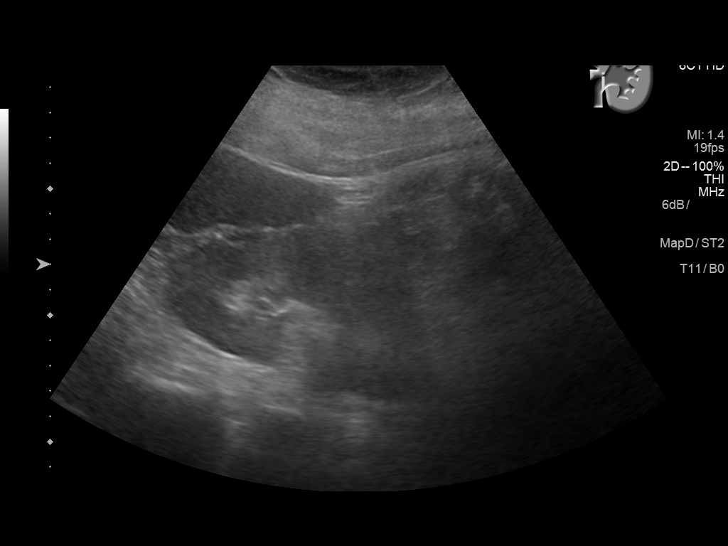
[im 39/52]
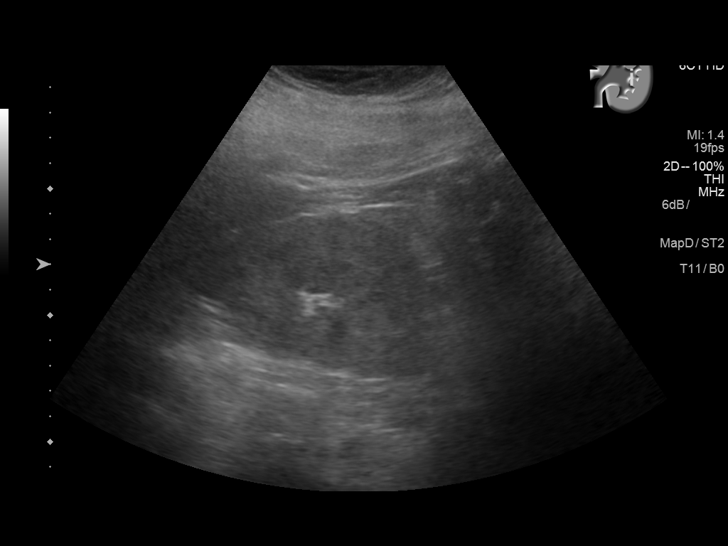
[im 43/52]
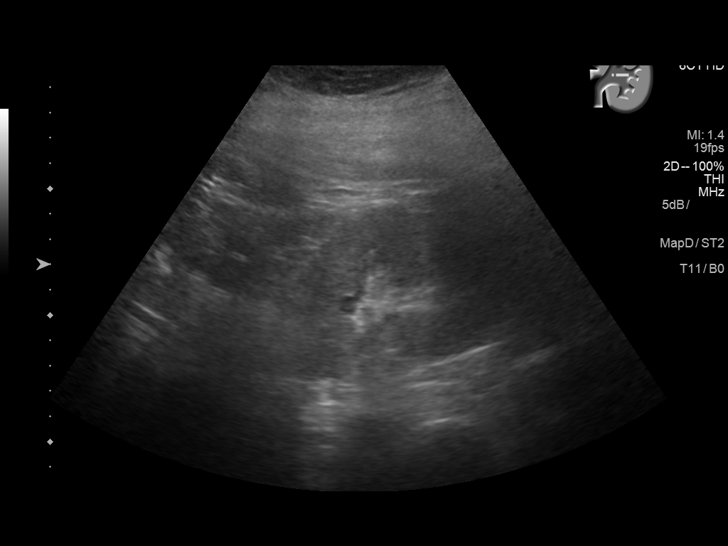
[im 47/52]
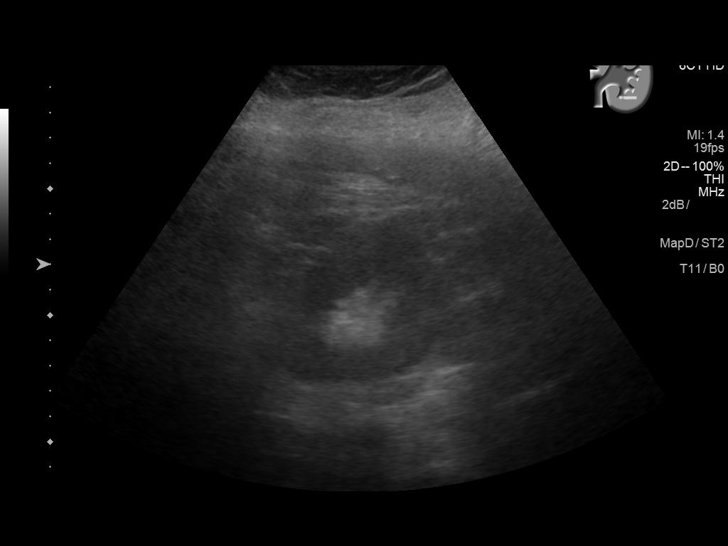
[im 52/52]
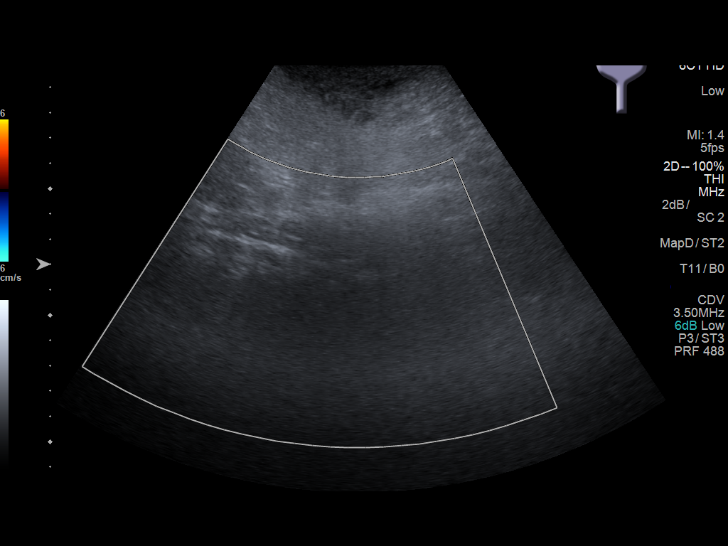

[14 of 25 positions shown; findings below may reference images not displayed]

FINDINGS: Right Kidney:

Length: 12.5 cm. Increased echogenicity consistent chronic medical
renal disease. No mass or hydronephrosis visualized.

Left Kidney:

Length: 12.0 cm. Increased echogenicity consistent chronic medical
renal disease. The area of the previously identified nonspecific
density noted arising from the medial left kidney on CT of
[DATE] is not visualized. No mass or hydronephrosis visualized.

Bladder:

Not visualized.
IMPRESSION: 1. Bilateral increased renal echogenicity consistent with chronic
medical renal disease.

2. The area of the previously identified nonspecific density noted
arising from the medial left kidney on CT of [DATE] is not
visualized. No focal renal lesion identified. No hydronephrosis.
Bladder nondistended.

## 2017-02-27 ENCOUNTER — Telehealth: Payer: Self-pay | Admitting: Family

## 2017-02-27 NOTE — Telephone Encounter (Addendum)
Gilmore Laroche from [facility] called to report her concerns regarding patient's blood pressure and increasing edema. She says that when she got to patient's home today around 11:00am, patient's BP was 258/110 as patient had not taken any of her medications yet today. Patient took her medications and BP 1/2 hour later was 158/90. Patient called Gilmore Laroche 30 minutes later and said her BP was now 124/78 & she was feeling dizzy.   Gilmore Laroche says that patient's abdominal distention is worsening and that her lower extremity edema is also worsening. Patient is currently taking torsemide 40mg  daily.   Darrick Meigs that patient cancelled her last scheduled appointment with [place] on 02/06/17. Darrick Meigs to have patient increase her diuretic to 40mg  BID. Patient was scheduled a follow-up appointment with [place] for 03/03/17 and will re-address her diuretic usage at that point along with doing a BMP.  Explained to Gilmore Laroche to let patient know that her dizziness was probably due to the fact that her BP dropped so much in such a short period of time as well as her body is probably getting used to an elevated BP.  She rarely brings her medications to the appointments so unsure of exactly what or how she is taking the medications.

## 2017-03-02 NOTE — Progress Notes (Deleted)
Patient ID: [name], female    DOB: [birth year], 47 y.o.   MRN: [id number]  HPI  [name] is a 47 y/o female with a history of DM, iron deficiency anemia, HTN, COPD with chronic oxygen use, asthma, obesity and chronic heart failure.   Echo report from 11/12/16 showed an EF of 55% along with mild MR. Reviewed echo done 06/27/16 which showed an EF of 60-65%. Previous echo was done 12/15/14 and showed an EF of 50-55% without any valvular regurgitation.   Admitted 01/07/17 due to pneumonia and pleural effusion. Treated with antibiotics and nebulizers. Elevated troponin thought to be due to demand ischemia. Discharged after 3 days. Admitted 11/07/16 due to hypoxic respiratory failure due to HF. Diuresed ~ 13L while hospitalized. Cardiology consult was obtained. Elevated troponin thought to be due to demand ischemia. Discharged home after 9 days.   She presents today for a follow-up visit with a chief complaint of    Past Medical History:  Diagnosis Date  . (HFpEF) heart failure with preserved ejection fraction ([place])    a. 11/2014 Echo: EF 50-55%, Gr1 DD; b. 06/2016 Echo: EF 60-65%, no rwma, mod dil LA, nl RV, triv eff.  . Asthma   . CKD (chronic kidney disease), stage III ([place])   . COPD (chronic obstructive pulmonary disease) ([place])    a. Home O2.  . Diabetes mellitus without complication ([place])   . Hypertension   . Iron deficiency anemia    a. chronic blood loss->heavy menses.  . Morbid obesity ([place])   . Pericardial effusion    a. 11/2014 CT Chest: Mod effusion; b. 11/2014 Echo: small to mod circumferential effusion. No hemodynamic compromise.  [name] Poorly controlled diabetes mellitus ([place])    a. 11/2014 A1c 13.2.   Past Surgical History:  Procedure Laterality Date  . CESAREAN SECTION     times 3   Family History  Problem Relation Age of Onset  . Diabetes Mother   . Hypertension Mother   . Hypertension Father   . Diabetes Father   . Hypertension Unknown   . Diabetes Unknown   .  Breast cancer Maternal Aunt 11  . Breast cancer Maternal Aunt 50   Social History   Tobacco Use  . Smoking status: Never Smoker  . Smokeless tobacco: Never Used  Substance Use Topics  . Alcohol use: Yes    Alcohol/week: 0.0 oz    Comment: occ drink - <1/wk   Allergies  Allergen Reactions  . Dust Mite Extract Shortness Of Breath and Itching  . Mold Extract [Trichophyton] Shortness Of Breath and Itching  . Feraheme [Ferumoxytol] Itching  . Pollen Extract   . Sulfa Antibiotics Itching     Review of Systems  Constitutional: Positive for appetite change (decreased) and fatigue.  HENT: Negative for congestion, postnasal drip and sinus pressure.   Eyes: Negative.   Respiratory: Positive for cough, shortness of breath and wheezing. Negative for chest tightness.   Cardiovascular: Negative for chest pain, palpitations and leg swelling.  Gastrointestinal: Negative for abdominal distention and abdominal pain.  Endocrine: Negative.   Genitourinary: Negative.   Musculoskeletal: Negative for back pain and neck pain.  Skin: Negative.   Allergic/Immunologic: Negative.   Neurological: Positive for weakness (improving) and light-headedness. Negative for dizziness.  Hematological: Negative for adenopathy. Does not bruise/bleed easily.  Psychiatric/Behavioral: Positive for dysphoric mood. Negative for sleep disturbance and suicidal ideas. The patient is nervous/anxious.      Lab Results  Component Value Date  CREATININE 2.23 (H) 01/10/2017   CREATININE 1.94 (H) 01/09/2017   CREATININE 1.73 (H) 01/08/2017    Physical Exam  Constitutional: She is oriented to person, place, and time. She appears well-developed and well-nourished.  HENT:  Head: Normocephalic and atraumatic.  Neck: Normal range of motion. Neck supple. No JVD present.  Cardiovascular: Normal rate and regular rhythm.  Pulmonary/Chest: Effort normal. She has no wheezes. She has no rales.  Abdominal: She exhibits no  distension. There is no tenderness.  Musculoskeletal: She exhibits edema (1+ edema around bilateral ankles). She exhibits no tenderness.  Neurological: She is alert and oriented to person, place, and time.  Skin: Skin is warm and dry.  Psychiatric: She has a normal mood and affect. Her behavior is normal. Thought content normal.  Nursing note and vitals reviewed.   Assessment & Plan:  1: Chronic heart failure with preserved ejection fraction- - NYHA class III - euvolemic today - patient says that her stated weight is 328 pounds and she refuses to get on the scale today as she says that she feels too tired - continue daily torsemide -  - not adding salt and trying to follow a 2000mg  sodium diet - says that she's drinking ~ 1.5 hospital cups of fluid daily - encouraged increased activty - last saw cardiologist Rockey Situ) 12/25/15.  - does not meet ReDS vest criteria due to BMI - reports receiving her flu vaccine for this season already - admits that she skips her torsemide "sometimes" because she's worried about her kidney function. Discussed the importance of taking the diuretic as prescribed to keep the fluid off and to help with her BP - BMP drawn today  2: COPD- - saw pulmonologist Ashby Dawes) 01/20/17 - wears oxygen on a continuous setting of 4L around the clock - patient sleepy today at times but wakes easily; has had issues in the past with elevated carbon dioxide levels -   3: HTN- - BP slightly elevated today but she's been skipping doses of torsemide - BMP on 01/10/17 reviewed and showed sodium 137, potassium 4.3 and GFR 29 - saw PCP on  - due to see nephrologist ([place])  4: Anemia- - saw hematologist Grayland Ormond) 08/22/16 and received a dose of feraheme - reports "all over" joint/muscle pain -  - patient says that she feels like she needs an iron infusion and now has charity care through [facility]; encouraged her and her husband to call about an  appointment  Patient did not bring her medications nor a list. Each medication was verbally reviewed with the patient and she was encouraged to bring the bottles to every visit to confirm accuracy of list.

## 2017-03-03 ENCOUNTER — Inpatient Hospital Stay
Admission: EM | Admit: 2017-03-03 | Discharge: 2017-03-10 | DRG: 291 | Disposition: A | Discharge status: Home Health | Payer: Medicaid Other | Attending: Family Medicine | Admitting: Family Medicine

## 2017-03-03 ENCOUNTER — Emergency Department: Payer: Medicaid Other

## 2017-03-03 ENCOUNTER — Other Ambulatory Visit: Payer: Self-pay

## 2017-03-03 ENCOUNTER — Telehealth: Payer: Self-pay | Admitting: Family

## 2017-03-03 ENCOUNTER — Ambulatory Visit: Payer: Medicaid Other | Admitting: Family

## 2017-03-03 DIAGNOSIS — Z794 Long term (current) use of insulin: Secondary | ICD-10-CM

## 2017-03-03 DIAGNOSIS — Z91048 Other nonmedicinal substance allergy status: Secondary | ICD-10-CM

## 2017-03-03 DIAGNOSIS — I5033 Acute on chronic diastolic (congestive) heart failure: Secondary | ICD-10-CM | POA: Diagnosis present

## 2017-03-03 DIAGNOSIS — D631 Anemia in chronic kidney disease: Secondary | ICD-10-CM | POA: Diagnosis present

## 2017-03-03 DIAGNOSIS — E1165 Type 2 diabetes mellitus with hyperglycemia: Secondary | ICD-10-CM | POA: Diagnosis present

## 2017-03-03 DIAGNOSIS — J40 Bronchitis, not specified as acute or chronic: Secondary | ICD-10-CM | POA: Diagnosis not present

## 2017-03-03 DIAGNOSIS — E66813 Obesity, class 3: Secondary | ICD-10-CM | POA: Diagnosis present

## 2017-03-03 DIAGNOSIS — Y95 Nosocomial condition: Secondary | ICD-10-CM | POA: Diagnosis not present

## 2017-03-03 DIAGNOSIS — N183 Chronic kidney disease, stage 3 unspecified: Secondary | ICD-10-CM | POA: Diagnosis present

## 2017-03-03 DIAGNOSIS — Z881 Allergy status to other antibiotic agents status: Secondary | ICD-10-CM

## 2017-03-03 DIAGNOSIS — N179 Acute kidney failure, unspecified: Secondary | ICD-10-CM | POA: Diagnosis present

## 2017-03-03 DIAGNOSIS — I509 Heart failure, unspecified: Secondary | ICD-10-CM

## 2017-03-03 DIAGNOSIS — J449 Chronic obstructive pulmonary disease, unspecified: Secondary | ICD-10-CM | POA: Diagnosis present

## 2017-03-03 DIAGNOSIS — J189 Pneumonia, unspecified organism: Secondary | ICD-10-CM | POA: Diagnosis not present

## 2017-03-03 DIAGNOSIS — E871 Hypo-osmolality and hyponatremia: Secondary | ICD-10-CM | POA: Diagnosis not present

## 2017-03-03 DIAGNOSIS — K5900 Constipation, unspecified: Secondary | ICD-10-CM | POA: Diagnosis present

## 2017-03-03 DIAGNOSIS — E662 Morbid (severe) obesity with alveolar hypoventilation: Secondary | ICD-10-CM | POA: Diagnosis present

## 2017-03-03 DIAGNOSIS — Z833 Family history of diabetes mellitus: Secondary | ICD-10-CM

## 2017-03-03 DIAGNOSIS — R0600 Dyspnea, unspecified: Secondary | ICD-10-CM

## 2017-03-03 DIAGNOSIS — E119 Type 2 diabetes mellitus without complications: Secondary | ICD-10-CM | POA: Diagnosis present

## 2017-03-03 DIAGNOSIS — J4489 Other specified chronic obstructive pulmonary disease: Secondary | ICD-10-CM | POA: Diagnosis present

## 2017-03-03 DIAGNOSIS — J440 Chronic obstructive pulmonary disease with acute lower respiratory infection: Secondary | ICD-10-CM | POA: Diagnosis not present

## 2017-03-03 DIAGNOSIS — Z9981 Dependence on supplemental oxygen: Secondary | ICD-10-CM | POA: Diagnosis not present

## 2017-03-03 DIAGNOSIS — E8809 Other disorders of plasma-protein metabolism, not elsewhere classified: Secondary | ICD-10-CM | POA: Diagnosis present

## 2017-03-03 DIAGNOSIS — I130 Hypertensive heart and chronic kidney disease with heart failure and stage 1 through stage 4 chronic kidney disease, or unspecified chronic kidney disease: Principal | ICD-10-CM | POA: Diagnosis present

## 2017-03-03 DIAGNOSIS — J9611 Chronic respiratory failure with hypoxia: Secondary | ICD-10-CM | POA: Diagnosis present

## 2017-03-03 DIAGNOSIS — E1121 Type 2 diabetes mellitus with diabetic nephropathy: Secondary | ICD-10-CM | POA: Diagnosis present

## 2017-03-03 DIAGNOSIS — N184 Chronic kidney disease, stage 4 (severe): Secondary | ICD-10-CM | POA: Diagnosis present

## 2017-03-03 DIAGNOSIS — N189 Chronic kidney disease, unspecified: Secondary | ICD-10-CM | POA: Diagnosis not present

## 2017-03-03 DIAGNOSIS — R339 Retention of urine, unspecified: Secondary | ICD-10-CM | POA: Diagnosis not present

## 2017-03-03 DIAGNOSIS — H1589 Other disorders of sclera: Secondary | ICD-10-CM | POA: Diagnosis not present

## 2017-03-03 DIAGNOSIS — E1122 Type 2 diabetes mellitus with diabetic chronic kidney disease: Secondary | ICD-10-CM | POA: Diagnosis present

## 2017-03-03 DIAGNOSIS — Z22322 Carrier or suspected carrier of Methicillin resistant Staphylococcus aureus: Secondary | ICD-10-CM

## 2017-03-03 DIAGNOSIS — F32A Depression, unspecified: Secondary | ICD-10-CM | POA: Diagnosis present

## 2017-03-03 DIAGNOSIS — N049 Nephrotic syndrome with unspecified morphologic changes: Secondary | ICD-10-CM | POA: Diagnosis present

## 2017-03-03 DIAGNOSIS — Z888 Allergy status to other drugs, medicaments and biological substances status: Secondary | ICD-10-CM | POA: Diagnosis not present

## 2017-03-03 DIAGNOSIS — Z6841 Body Mass Index (BMI) 40.0 and over, adult: Secondary | ICD-10-CM | POA: Diagnosis not present

## 2017-03-03 DIAGNOSIS — Z713 Dietary counseling and surveillance: Secondary | ICD-10-CM

## 2017-03-03 DIAGNOSIS — F329 Major depressive disorder, single episode, unspecified: Secondary | ICD-10-CM | POA: Diagnosis present

## 2017-03-03 DIAGNOSIS — N186 End stage renal disease: Secondary | ICD-10-CM | POA: Diagnosis present

## 2017-03-03 DIAGNOSIS — Z7982 Long term (current) use of aspirin: Secondary | ICD-10-CM

## 2017-03-03 DIAGNOSIS — Z79899 Other long term (current) drug therapy: Secondary | ICD-10-CM

## 2017-03-03 DIAGNOSIS — J810 Acute pulmonary edema: Secondary | ICD-10-CM | POA: Diagnosis not present

## 2017-03-03 DIAGNOSIS — R0602 Shortness of breath: Secondary | ICD-10-CM

## 2017-03-03 DIAGNOSIS — Z8249 Family history of ischemic heart disease and other diseases of the circulatory system: Secondary | ICD-10-CM

## 2017-03-03 LAB — GLUCOSE, CAPILLARY
GLUCOSE-CAPILLARY: 149 mg/dL — AB (ref 65–99)
GLUCOSE-CAPILLARY: 191 mg/dL — AB (ref 65–99)
Glucose-Capillary: 149 mg/dL — ABNORMAL HIGH (ref 65–99)

## 2017-03-03 LAB — CBC WITH DIFFERENTIAL/PLATELET
BASOS ABS: 0.1 10*3/uL (ref 0–0.1)
Basophils Relative: 1 %
EOS PCT: 1 %
Eosinophils Absolute: 0.1 10*3/uL (ref 0–0.7)
HCT: 30.3 % — ABNORMAL LOW (ref 35.0–47.0)
Hemoglobin: 9.5 g/dL — ABNORMAL LOW (ref 12.0–16.0)
LYMPHS PCT: 11 %
Lymphs Abs: 0.8 10*3/uL — ABNORMAL LOW (ref 1.0–3.6)
MCH: 26.6 pg (ref 26.0–34.0)
MCHC: 31.4 g/dL — ABNORMAL LOW (ref 32.0–36.0)
MCV: 84.8 fL (ref 80.0–100.0)
MONO ABS: 0.7 10*3/uL (ref 0.2–0.9)
Monocytes Relative: 9 %
Neutro Abs: 5.8 10*3/uL (ref 1.4–6.5)
Neutrophils Relative %: 78 %
PLATELETS: 380 10*3/uL (ref 150–440)
RBC: 3.58 MIL/uL — ABNORMAL LOW (ref 3.80–5.20)
RDW: 16.1 % — AB (ref 11.5–14.5)
WBC: 7.4 10*3/uL (ref 3.6–11.0)

## 2017-03-03 LAB — COMPREHENSIVE METABOLIC PANEL
ALK PHOS: 297 U/L — AB (ref 38–126)
ALT: 16 U/L (ref 14–54)
ANION GAP: 6 (ref 5–15)
AST: 26 U/L (ref 15–41)
Albumin: 2.3 g/dL — ABNORMAL LOW (ref 3.5–5.0)
BILIRUBIN TOTAL: 0.6 mg/dL (ref 0.3–1.2)
BUN: 28 mg/dL — ABNORMAL HIGH (ref 6–20)
CALCIUM: 7.7 mg/dL — AB (ref 8.9–10.3)
CO2: 31 mmol/L (ref 22–32)
Chloride: 98 mmol/L — ABNORMAL LOW (ref 101–111)
Creatinine, Ser: 2.14 mg/dL — ABNORMAL HIGH (ref 0.44–1.00)
GFR calc non Af Amer: 27 mL/min — ABNORMAL LOW (ref >60)
GFR, EST AFRICAN AMERICAN: 31 mL/min — AB (ref >60)
Glucose, Bld: 168 mg/dL — ABNORMAL HIGH (ref 65–99)
Potassium: 4.1 mmol/L (ref 3.5–5.1)
Sodium: 135 mmol/L (ref 135–145)
TOTAL PROTEIN: 6.3 g/dL — AB (ref 6.5–8.1)

## 2017-03-03 LAB — BRAIN NATRIURETIC PEPTIDE: B Natriuretic Peptide: 617 pg/mL — ABNORMAL HIGH (ref 0.0–100.0)

## 2017-03-03 LAB — TROPONIN I: Troponin I: <0.03 ng/mL (ref <0.03)

## 2017-03-03 IMAGING — CR DG CHEST 2V
1 series · 2 of 2 positions shown · non-contrast
Comparison: [DATE]

CLINICAL DATA: Dyspnea.  Short of breath and fluid retention

EXAM:
CHEST  2 VIEW

[Series 1: dg chest 2 view · 0.14mm/px · 2 of 2 slices shown]
[im 1/2]
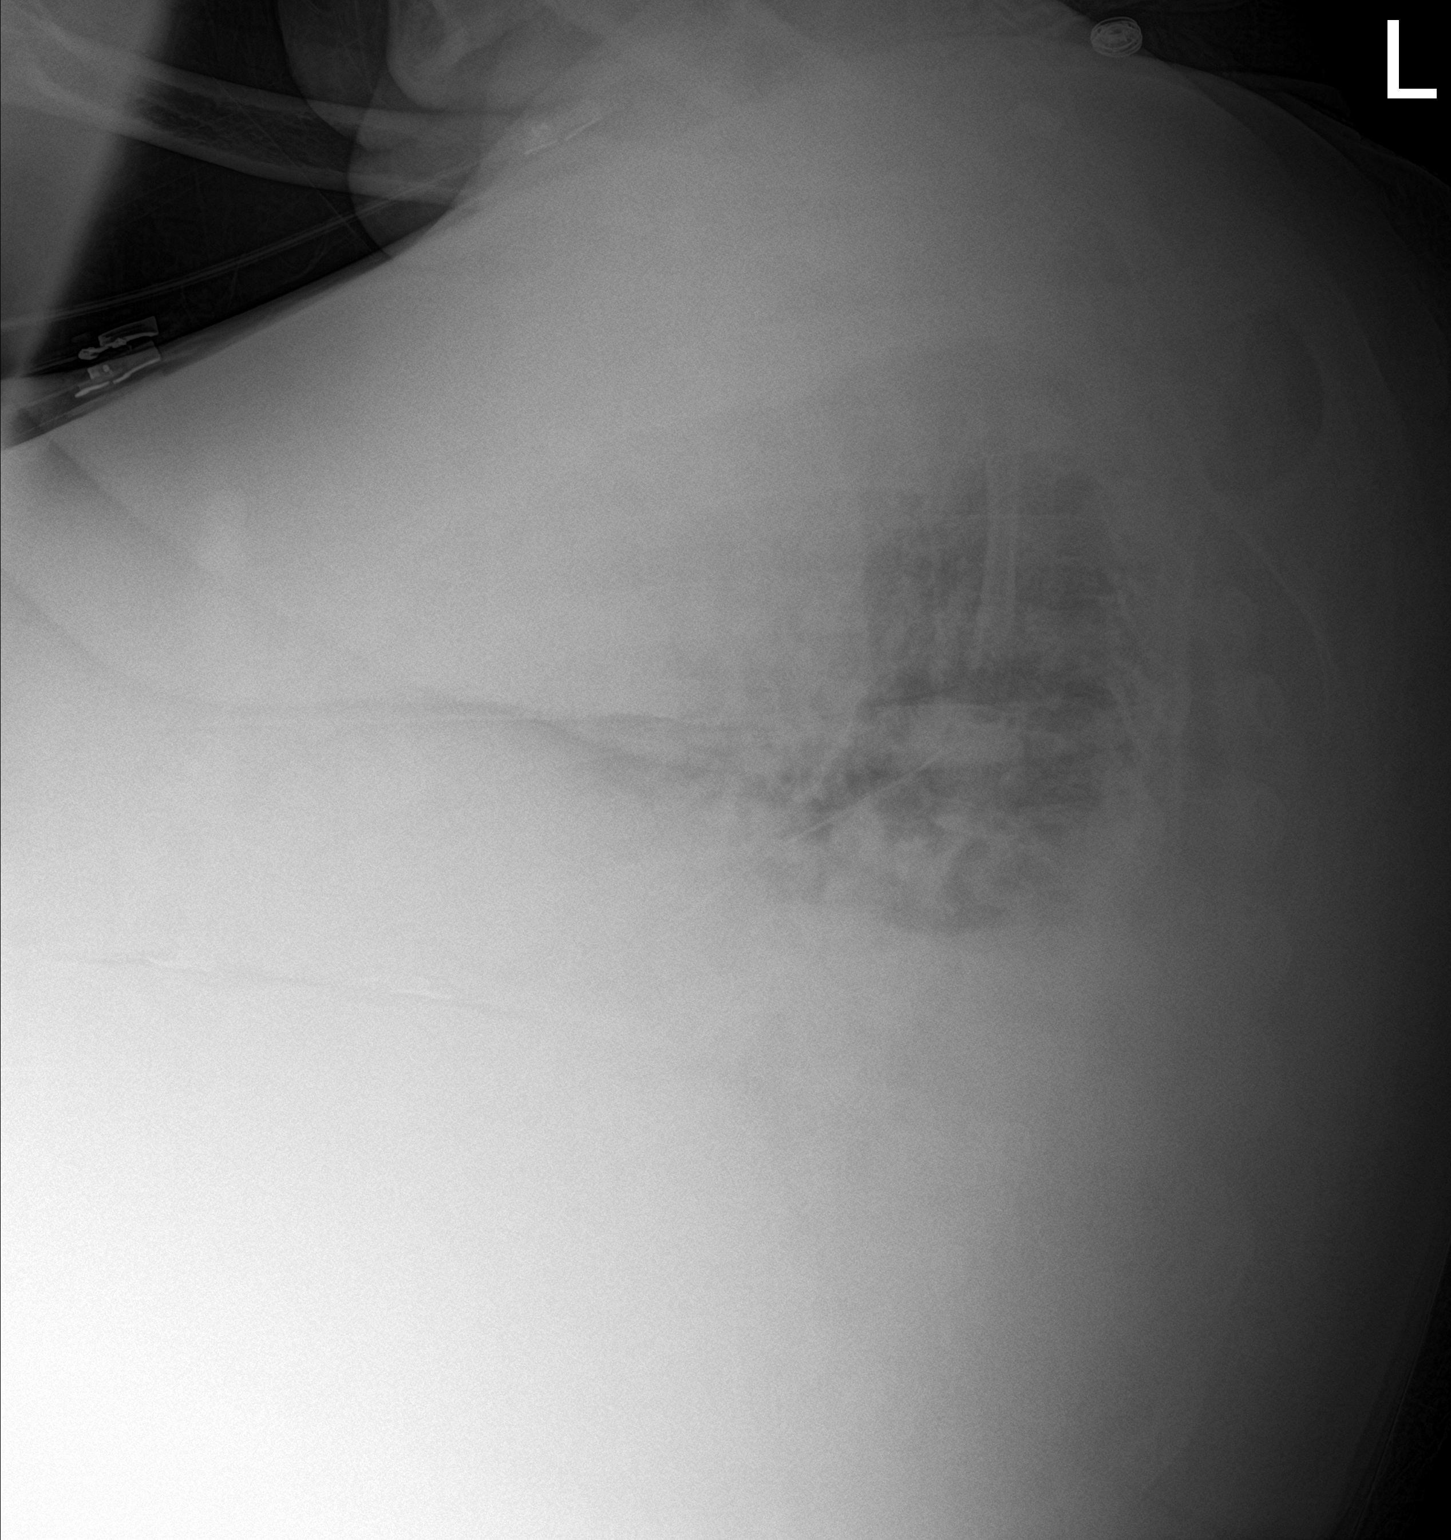
[im 2/2]
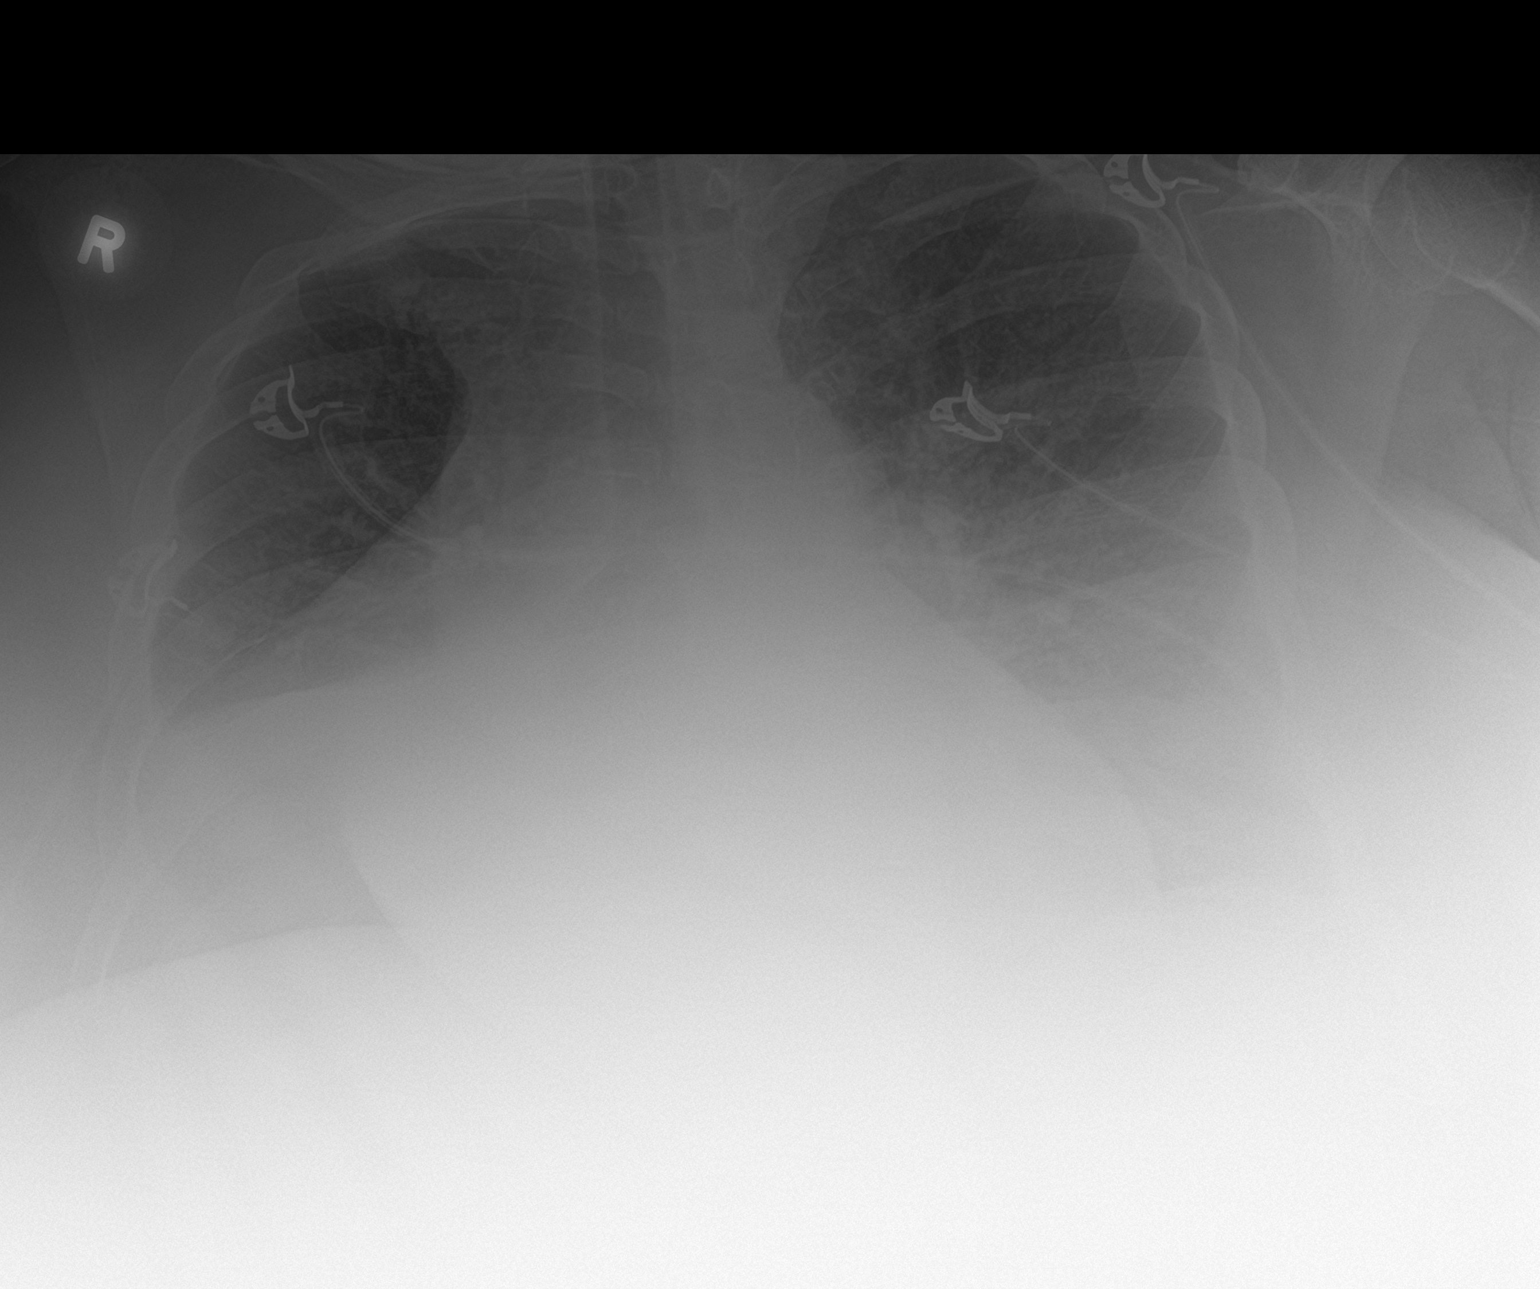

[2 of 2 positions shown; findings below may reference images not displayed]

FINDINGS: Hypoventilation with decreased lung volume. Cardiac enlargement with
pulmonary vascular congestion. Negative for edema.

Right pleural effusion and right lower lobe airspace disease
unchanged. Progression of left effusion and left lower lobe airspace
disease.
IMPRESSION: Findings consistent with congestive heart failure. Improvement in
pulmonary edema. Bilateral pleural effusions and bibasilar
atelectasis, with some progression of left lower lobe airspace
disease and effusions since the prior study.

## 2017-03-03 MED ORDER — METOPROLOL TARTRATE 50 MG PO TABS
100.0000 mg | ORAL_TABLET | Freq: Two times a day (BID) | ORAL | Status: DC
  Administered 2017-03-04 – 2017-03-10 (×10): 100 mg via ORAL
  Filled 2017-03-03 (×13): qty 2

## 2017-03-03 MED ORDER — ATORVASTATIN CALCIUM 20 MG PO TABS
80.0000 mg | ORAL_TABLET | Freq: Every day | ORAL | Status: DC
  Administered 2017-03-04 – 2017-03-10 (×6): 80 mg via ORAL
  Filled 2017-03-03: qty 4
  Filled 2017-03-03 (×2): qty 8
  Filled 2017-03-03: qty 4
  Filled 2017-03-03: qty 8
  Filled 2017-03-03: qty 4

## 2017-03-03 MED ORDER — ASPIRIN EC 81 MG PO TBEC
81.0000 mg | DELAYED_RELEASE_TABLET | Freq: Every day | ORAL | Status: DC
  Administered 2017-03-04 – 2017-03-10 (×7): 81 mg via ORAL
  Filled 2017-03-03 (×7): qty 1

## 2017-03-03 MED ORDER — ALBUTEROL SULFATE HFA 108 (90 BASE) MCG/ACT IN AERS: 2.0000 | INHALATION_SPRAY | Freq: Four times a day (QID) | RESPIRATORY_TRACT | Status: DC | PRN

## 2017-03-03 MED ORDER — MORPHINE SULFATE (PF) 4 MG/ML IV SOLN
4.0000 mg | Freq: Once | INTRAVENOUS | Status: AC
  Administered 2017-03-03: 4 mg via INTRAVENOUS
  Filled 2017-03-03: qty 1

## 2017-03-03 MED ORDER — ONDANSETRON HCL 4 MG/2ML IJ SOLN: 4.0000 mg | Freq: Four times a day (QID) | INTRAMUSCULAR | Status: DC | PRN

## 2017-03-03 MED ORDER — POLYETHYLENE GLYCOL 3350 17 G PO PACK: 17.0000 g | PACK | Freq: Every day | ORAL | Status: DC | PRN

## 2017-03-03 MED ORDER — ACETAMINOPHEN 325 MG PO TABS
650.0000 mg | ORAL_TABLET | Freq: Four times a day (QID) | ORAL | Status: DC | PRN
  Administered 2017-03-03 – 2017-03-09 (×11): 650 mg via ORAL
  Filled 2017-03-03 (×11): qty 2

## 2017-03-03 MED ORDER — VITAMIN D 1000 UNITS PO TABS
4000.0000 [IU] | ORAL_TABLET | ORAL | Status: DC
  Administered 2017-03-10: 4000 [IU] via ORAL
  Filled 2017-03-03 (×3): qty 4

## 2017-03-03 MED ORDER — HYDRALAZINE HCL 50 MG PO TABS
100.0000 mg | ORAL_TABLET | Freq: Three times a day (TID) | ORAL | Status: DC
  Administered 2017-03-03 – 2017-03-10 (×18): 100 mg via ORAL
  Filled 2017-03-03 (×22): qty 2

## 2017-03-03 MED ORDER — FUROSEMIDE 10 MG/ML IJ SOLN
80.0000 mg | Freq: Two times a day (BID) | INTRAMUSCULAR | Status: DC
  Administered 2017-03-04: 80 mg via INTRAVENOUS
  Filled 2017-03-03: qty 8

## 2017-03-03 MED ORDER — CLONIDINE HCL 0.1 MG PO TABS
0.2000 mg | ORAL_TABLET | Freq: Two times a day (BID) | ORAL | Status: DC
  Administered 2017-03-03 – 2017-03-10 (×13): 0.2 mg via ORAL
  Filled 2017-03-03 (×14): qty 2

## 2017-03-03 MED ORDER — FUROSEMIDE 10 MG/ML IJ SOLN
80.0000 mg | Freq: Once | INTRAMUSCULAR | Status: AC
  Administered 2017-03-03: 80 mg via INTRAVENOUS
  Filled 2017-03-03: qty 8

## 2017-03-03 MED ORDER — FUROSEMIDE 10 MG/ML IJ SOLN: 40.0000 mg | Freq: Two times a day (BID) | INTRAMUSCULAR | Status: DC

## 2017-03-03 MED ORDER — ACETAMINOPHEN 650 MG RE SUPP: 650.0000 mg | Freq: Four times a day (QID) | RECTAL | Status: DC | PRN

## 2017-03-03 MED ORDER — CITALOPRAM HYDROBROMIDE 20 MG PO TABS
40.0000 mg | ORAL_TABLET | Freq: Every day | ORAL | Status: DC
  Administered 2017-03-04 – 2017-03-10 (×3): 40 mg via ORAL
  Filled 2017-03-03 (×7): qty 2

## 2017-03-03 MED ORDER — TRAMADOL HCL 50 MG PO TABS
50.0000 mg | ORAL_TABLET | Freq: Four times a day (QID) | ORAL | Status: DC | PRN
  Administered 2017-03-03 – 2017-03-06 (×4): 50 mg via ORAL
  Filled 2017-03-03 (×4): qty 1

## 2017-03-03 MED ORDER — ALBUTEROL SULFATE (2.5 MG/3ML) 0.083% IN NEBU
2.5000 mg | INHALATION_SOLUTION | Freq: Four times a day (QID) | RESPIRATORY_TRACT | Status: DC | PRN
  Administered 2017-03-03 – 2017-03-08 (×8): 2.5 mg via RESPIRATORY_TRACT
  Filled 2017-03-03 (×8): qty 3

## 2017-03-03 MED ORDER — ONDANSETRON HCL 4 MG PO TABS: 4.0000 mg | ORAL_TABLET | Freq: Four times a day (QID) | ORAL | Status: DC | PRN

## 2017-03-03 MED ORDER — HEPARIN SODIUM (PORCINE) 5000 UNIT/ML IJ SOLN
5000.0000 [IU] | Freq: Three times a day (TID) | INTRAMUSCULAR | Status: DC
  Administered 2017-03-03 – 2017-03-10 (×22): 5000 [IU] via SUBCUTANEOUS
  Filled 2017-03-03 (×21): qty 1

## 2017-03-03 MED ORDER — INSULIN ASPART 100 UNIT/ML ~~LOC~~ SOLN
0.0000 [IU] | Freq: Three times a day (TID) | SUBCUTANEOUS | Status: DC
  Administered 2017-03-03: 1 [IU] via SUBCUTANEOUS
  Administered 2017-03-04 (×3): 2 [IU] via SUBCUTANEOUS
  Administered 2017-03-05 – 2017-03-06 (×5): 1 [IU] via SUBCUTANEOUS
  Administered 2017-03-07: 2 [IU] via SUBCUTANEOUS
  Administered 2017-03-07 – 2017-03-10 (×9): 1 [IU] via SUBCUTANEOUS
  Administered 2017-03-10: 2 [IU] via SUBCUTANEOUS
  Administered 2017-03-10: 1 [IU] via SUBCUTANEOUS
  Filled 2017-03-03 (×21): qty 1

## 2017-03-03 NOTE — Consult Note (Signed)
Cardiology Consultation:   Patient ID: [name]; [id number]; 10-17-1970   Admit date: 03/03/2017 Date of Consult: 03/03/2017  Primary Care Provider: [name], NP Primary Cardiologist: Dr. Rockey [name] Primary Electrophysiologist:  n/[name]   Patient Profile:   [name] is [name] 47 y.o. female with [name] hx of chronic diastolic heart failure, chronic kidney disease and morbid obesity who is being seen today for the evaluation of acute on chronic diastolic heart failure at the request of Dr. Posey [name].  History of Present Illness:   [name] is [name] 47 year old African-American female with known history of chronic diastolic heart failure, chronic kidney disease stage III, type 2 diabetes, hypertension, morbid obesity and suspected sleep apnea currently not on CPAP.  Patient has been managed mostly at the heart failure clinic and she has experienced progressive generalized edema and weight gain.  According to her clinic weights, previous weight was around 315-320 pounds about 7 months ago and currently she is 403 pounds. She experienced progressive and severe leg edema and abdominal swelling in spite of taking increased doses of torsemide.  She has not been responding very well to oral diuretics.  She reports adherence to low-sodium diet.  Given her progressive weight gain, her mobility has been limited recently and she stays in the house most of the time.  She snores loudly and possibly has underlying sleep apnea but no recent testing in the last few years.  The patient was found to have mildly elevated BNP.  Chest x-ray showed pulmonary vascular congestion.  She was given 1 dose of IV furosemide.  Urine output is still not documented.  Past Medical History:  Diagnosis Date  . (HFpEF) heart failure with preserved ejection fraction ([place])    [name]. 11/2014 Echo: EF 50-55%, Gr1 DD; b. 06/2016 Echo: EF 60-65%, no rwma, mod dil LA, nl RV, triv eff.  . Asthma   . CKD (chronic kidney disease), stage III  ([place])   . COPD (chronic obstructive pulmonary disease) ([place])    [name]. Home O2.  . Diabetes mellitus without complication ([place])   . Hypertension   . Iron deficiency anemia    [name]. chronic blood loss->heavy menses.  . Morbid obesity ([place])   . Pericardial effusion    [name]. 11/2014 CT Chest: Mod effusion; b. 11/2014 Echo: small to mod circumferential effusion. No hemodynamic compromise.  [name] Poorly controlled diabetes mellitus ([place])    [name]. 11/2014 A1c 13.2.    Past Surgical History:  Procedure Laterality Date  . CESAREAN SECTION     times 3     Home Medications:  Prior to Admission medications   Medication Sig Start Date End Date Taking? Authorizing Provider  acetaminophen (TYLENOL) 500 MG tablet Take 1,000 mg by mouth every 6 (six) hours as needed for mild pain, fever or headache.   Yes [provider]  albuterol (PROVENTIL HFA;VENTOLIN HFA) 108 (90 Base) MCG/ACT inhaler Inhale 2 puffs into the lungs every 6 (six) hours as needed for wheezing or shortness of breath. 01/21/17  Yes [name] Hobby, MD  albuterol (PROVENTIL) (2.5 MG/3ML) 0.083% nebulizer solution Take 2.5 mg by nebulization every 6 (six) hours as needed for wheezing or shortness of breath.   Yes [provider]  aspirin EC 81 MG tablet Take 81 mg by mouth daily.    Yes [provider]  atorvastatin (LIPITOR) 80 MG tablet Take 1 tablet (80 mg total) by mouth daily at 6 PM. 11/16/16  Yes [name] Costa, MD  Cholecalciferol (HM  VITAMIN D3) 4000 units CAPS Take 1 capsule by mouth once [name] week.   Yes [provider]  citalopram (CELEXA) 40 MG tablet Take 1 tablet (40 mg total) by mouth daily. 11/16/16  Yes [name], [name] Bold, MD  cloNIDine (CATAPRES) 0.2 MG tablet Take 1 tablet (0.2 mg total) by mouth 2 (two) times daily. 01/23/17  Yes [name] Price A, FNP  hydrALAZINE (APRESOLINE) 100 MG tablet Take 1 tablet (100 mg total) by mouth every 8 (eight) hours. 11/16/16  Yes [name], [name] Bold, MD  insulin aspart (NOVOLOG  FLEXPEN) 100 UNIT/ML FlexPen Inject 0-12 Units into the skin 3 (three) times daily with meals as needed for high blood sugar. Pt uses as needed per sliding scale. 11/16/16  Yes [name], [name] Bold, MD  losartan (COZAAR) 50 MG tablet Take 50 mg daily by mouth.   Yes [provider]  metoprolol tartrate (LOPRESSOR) 100 MG tablet Take 1 tablet (100 mg total) by mouth 2 (two) times daily. 11/16/16  Yes [name], [name] Bold, MD  torsemide (DEMADEX) 20 MG tablet Take 2 tablets (40 mg total) by mouth daily. 11/16/16  Yes [name], [name] Bold, MD  guaiFENesin-dextromethorphan (ROBITUSSIN DM) 100-10 MG/5ML syrup Take 5 mLs every 4 (four) hours as needed by mouth for cough. 01/10/17   [name] Loll, MD  ipratropium-albuterol (DUONEB) 0.5-2.5 (3) MG/3ML SOLN Take 3 mLs by nebulization every 6 (six) hours as needed. 01/20/17   [name] Hobby, MD  polyethylene glycol (MIRALAX / GLYCOLAX) packet Take 17 g by mouth daily as needed for mild constipation. 06/29/16   [name] Basta, MD  potassium chloride (K-DUR) 10 MEQ tablet Take 1 tablet (10 mEq total) by mouth 2 (two) times daily. Patient not taking: Reported on 03/03/2017 11/16/16   [name] Costa, MD  predniSONE (STERAPRED UNI-PAK 21 TAB) 10 MG (21) TBPK tablet Take as directed Patient not taking: Reported on 03/03/2017 01/20/17   [name] Hobby, MD    Inpatient Medications: Scheduled Meds: . [START ON 03/04/2017] aspirin EC  81 mg Oral Daily  . [START ON 03/04/2017] atorvastatin  80 mg Oral q1800  . cholecalciferol  4,000 Units Oral Weekly  . citalopram  40 mg Oral Daily  . cloNIDine  0.2 mg Oral BID  . [START ON 03/04/2017] furosemide  80 mg Intravenous Q12H  . heparin  5,000 Units Subcutaneous Q8H  . hydrALAZINE  100 mg Oral Q8H  . insulin aspart  0-9 Units Subcutaneous TID WC  . metoprolol tartrate  100 mg Oral BID   Continuous Infusions:  PRN Meds: acetaminophen **OR** acetaminophen, albuterol, ondansetron **OR** ondansetron (ZOFRAN) IV, polyethylene glycol,  polyethylene glycol, traMADol  Allergies:    Allergies  Allergen Reactions  . Dust Mite Extract Shortness Of Breath and Itching  . Mold Extract [Trichophyton] Shortness Of Breath and Itching  . Feraheme [Ferumoxytol] Itching  . Pollen Extract   . Sulfa Antibiotics Itching    Social History:   Social History   Socioeconomic History  . Marital status: Married    Spouse name: Not on file  . Number of children: Not on file  . Years of education: Not on file  . Highest education level: Not on file  Social Needs  . Financial resource strain: Not on file  . Food insecurity - worry: Not on file  . Food insecurity - inability: Not on file  . Transportation needs - medical: Not on file  . Transportation needs - non-medical: Not on file  Occupational History  . Occupation: Disabled  Tobacco Use  . Smoking status:  Never Smoker  . Smokeless tobacco: Never Used  Substance and Sexual Activity  . Alcohol use: Yes    Alcohol/week: 0.0 oz    Comment: occ drink - <1/wk  . Drug use: No  . Sexual activity: Not on file  Other Topics Concern  . Not on file  Social History Narrative   Lives in [place] with her husband and 14 y/o child.  Three other children are grown and out of the house.  She is sedentary and does not routinely exercise.  On home O2.    Family History:    Family History  Problem Relation Age of Onset  . Diabetes Mother   . Hypertension Mother   . Hypertension Father   . Diabetes Father   . Hypertension Unknown   . Diabetes Unknown   . Breast cancer Maternal Aunt 71  . Breast cancer Maternal Aunt 50     ROS:  Please see the history of present illness.  ROS  All other ROS reviewed and negative.     Physical Exam/Data:   Vitals:   03/03/17 1300 03/03/17 1452 03/03/17 1533 03/03/17 1534  BP:  (!) 148/89 132/86   Pulse: 67 68 69   Resp: 10     Temp:   97.8 F (36.6 C)   TempSrc:   Oral   SpO2: 93% 94% 96%   Weight:    (!) 403 lb 8 oz (183 kg)  Height:     5\' 4"  (1.626 m)   No intake or output data in the 24 hours ending 03/03/17 1722 Filed Weights   03/03/17 1025 03/03/17 1534  Weight: (!) 355 lb (161 kg) (!) 403 lb 8 oz (183 kg)   Body mass index is 69.26 kg/m.  General:  Well nourished, well developed, in no acute distress HEENT: normal Lymph: no adenopathy Neck: Jugular venous pressure is not visualized. Endocrine:  No thryomegaly Vascular: No carotid bruits;  Cardiac:  normal S1, S2; RRR; no murmur .  Distant heart sounds Lungs:  clear to auscultation bilaterally with diminished breath sounds at the base, no wheezing, rhonchi or rales  Abd: soft, nontender, no hepatomegaly .  Significantly distended Ext: n severe bilateral leg edema extending to the thigh and abdomen Musculoskeletal:  No deformities, BUE and BLE strength normal and equal Skin: warm and dry  Neuro:  CNs 2-12 intact, no focal abnormalities noted Psych:  Normal affect   EKG:  The EKG was personally reviewed and demonstrates: Sinus rhythm with low voltage and nonspecific T wave changes. Telemetry:  Telemetry was personally reviewed and demonstrates: Normal sinus rhythm  Relevant CV Studies: Echocardiogram in August was reviewed and showed normal LV systolic function with possibly grade 2 diastolic dysfunction.  IVC was dilated and pulmonary pressure was moderately increased.  Laboratory Data:  Chemistry Recent Labs  Lab 03/03/17 1114  NA 135  K 4.1  CL 98*  CO2 31  GLUCOSE 168*  BUN 28*  CREATININE 2.14*  CALCIUM 7.7*  GFRNONAA 27*  GFRAA 31*  ANIONGAP 6    Recent Labs  Lab 03/03/17 1114  PROT 6.3*  ALBUMIN 2.3*  AST 26  ALT 16  ALKPHOS 297*  BILITOT 0.6   Hematology Recent Labs  Lab 03/03/17 1114  WBC 7.4  RBC 3.58*  HGB 9.5*  HCT 30.3*  MCV 84.8  MCH 26.6  MCHC 31.4*  RDW 16.1*  PLT 380   Cardiac Enzymes Recent Labs  Lab 03/03/17 1114  TROPONINI <0.03   No  results for input(s): TROPIPOC in the last 168 hours.  BNP Recent  Labs  Lab 03/03/17 1114  BNP 617.0*    DDimer No results for input(s): DDIMER in the last 168 hours.  Radiology/Studies:  Dg Chest 2 View  Result Date: 03/03/2017 CLINICAL DATA:  Dyspnea.  Short of breath and fluid retention EXAM: CHEST  2 VIEW COMPARISON:  01/07/2017 FINDINGS: Hypoventilation with decreased lung volume. Cardiac enlargement with pulmonary vascular congestion. Negative for edema. Right pleural effusion and right lower lobe airspace disease unchanged. Progression of left effusion and left lower lobe airspace disease. IMPRESSION: Findings consistent with congestive heart failure. Improvement in pulmonary edema. Bilateral pleural effusions and bibasilar atelectasis, with some progression of left lower lobe airspace disease and effusions since the prior study. Electronically Signed   By: Franchot Gallo M.D.   On: 03/03/2017 10:55   Dg Abd 2 Views  Result Date: 03/03/2017 CLINICAL DATA:  Shortness of breath and weight gain. EXAM: ABDOMEN - 2 VIEW COMPARISON:  PA and lateral chest this same day. FINDINGS: Right pleural effusion and basilar airspace disease are seen. No free intraperitoneal air is identified. The bowel gas pattern is nonobstructive. No abnormal abdominal calcification or focal bony abnormality. IMPRESSION: Negative abdomen. Right pleural effusion and basilar airspace disease. Electronically Signed   By: Inge Rise M.D.   On: 03/03/2017 15:02    Assessment and Plan:   1. Acute on chronic diastolic heart failure with massive volume overload: She is at least 50 pounds above her dry weight.  She has significant generalized edema.  She does have underlying chronic kidney disease which makes diuresis more challenging.  I increased furosemide to 80 mg intravenously twice daily.  Continue with strict I's and O's and low-sodium diet.  The patient might require furosemide drip if she does not respond very well to this. 2. Chronic kidney disease: This might worsen with  aggressive diuresis.  I consulted nephrology to be on board.  3.  Essential hypertension: Blood pressure is mildly elevated.  Continue to monitor closely.  4.  Morbid obesity: Significantly contributing to heart failure.  5.  Suspected sleep apnea: The patient benefits from sleep study in the near future.   For questions or updates, please contact [place] Please consult [email or web address] for contact info under Cardiology/STEMI.   Signed, Kathlyn Sacramento, MD  03/03/2017 5:22 PM

## 2017-03-03 NOTE — ED Provider Notes (Signed)
[facility] Emergency Department Provider Note       Time seen: ----------------------------------------- 10:10 AM on 03/03/2017 -----------------------------------------   I have reviewed the triage vital signs and the nursing notes.  HISTORY   Chief Complaint No chief complaint on file.    HPI [name] is a 47 y.o. female with a history of heart failure, chronic kidney disease, COPD, diabetes, hypertension, pericardial effusion who presents to the ED for shortness of breath.  She feels like she has gained excess fluid in her abdomen is more distended.  Patient states she was on the way to the heart failure clinic today when she decided to come to the ER for evaluation.  She denies recent illness or other complaints.  She reports she is not sure how much she weighs because she cannot stand on her scale at this time.  Patient reports she wears 4 L of oxygen all the time.  Past Medical History:  Diagnosis Date  . (HFpEF) heart failure with preserved ejection fraction ([place])    a. 11/2014 Echo: EF 50-55%, Gr1 DD; b. 06/2016 Echo: EF 60-65%, no rwma, mod dil LA, nl RV, triv eff.  . Asthma   . CKD (chronic kidney disease), stage III ([place])   . COPD (chronic obstructive pulmonary disease) ([place])    a. Home O2.  . Diabetes mellitus without complication ([place])   . Hypertension   . Iron deficiency anemia    a. chronic blood loss->heavy menses.  . Morbid obesity ([place])   . Pericardial effusion    a. 11/2014 CT Chest: Mod effusion; b. 11/2014 Echo: small to mod circumferential effusion. No hemodynamic compromise.  [name] Poorly controlled diabetes mellitus ([place])    a. 11/2014 A1c 13.2.    Patient Active Problem List   Diagnosis Date Noted  . Chronic diastolic heart failure ([place]) 01/07/2017  . Pneumonia 01/07/2017  . Acute exacerbation of CHF (congestive heart failure) ([place]) 11/07/2016  . Depression 09/24/2016  . Acute on chronic diastolic heart failure ([place])  07/04/2016  . COPD with chronic bronchitis ([place]) 07/04/2016  . CKD (chronic kidney disease), stage III ([place]) 06/26/2016  . COPD exacerbation ([place]) 03/25/2016  . Anxiety 10/02/2015  . Asthma 08/30/2015  . Poorly controlled type 2 diabetes mellitus ([place]) 03/13/2015  . Iron deficiency anemia 02/17/2015  . Morbid obesity ([place])   . Shortness of breath   . Essential hypertension   . Hypertensive heart disease with congestive heart failure ([place]) 12/18/2014  . Diabetes ([place]) 12/15/2014    Past Surgical History:  Procedure Laterality Date  . CESAREAN SECTION     times 3    Allergies Dust mite extract; Mold extract [trichophyton]; Feraheme [ferumoxytol]; Pollen extract; and Sulfa antibiotics  Social History Social History   Tobacco Use  . Smoking status: Never Smoker  . Smokeless tobacco: Never Used  Substance Use Topics  . Alcohol use: Yes    Alcohol/week: 0.0 oz    Comment: occ drink - <1/wk  . Drug use: No    Review of Systems Constitutional: Negative for fever. Cardiovascular: Negative for chest pain. Respiratory: Positive for shortness of breath Gastrointestinal: Positive for abdominal distention, negative for vomiting and diarrhea Genitourinary: Negative for oliguria Musculoskeletal: Negative for back pain. Skin: Negative for rash. Neurological: Positive for generalized weakness  All systems negative/normal/unremarkable except as stated in the HPI  ____________________________________________   PHYSICAL EXAM:  VITAL SIGNS: ED Triage Vitals  Enc Vitals Group     BP  Pulse      Resp      Temp      Temp src      SpO2      Weight      Height      Head Circumference      Peak Flow      Pain Score      Pain Loc      Pain Edu?      Excl. in [employer]?     Constitutional: Alert and oriented.  All distress.  Super morbid obesity Eyes: Conjunctivae are normal. Normal extraocular movements. ENT   Head: Normocephalic and atraumatic.   Nose: No  congestion/rhinnorhea.   Mouth/Throat: Mucous membranes are moist.   Neck: No stridor. Cardiovascular: Normal rate, regular rhythm. No murmurs, rubs, or gallops. Respiratory: Mild tachypnea, mild rales in the bases. Gastrointestinal: Soft and nontender. Normal bowel sounds Musculoskeletal: Limited range of motion of the extremities, extensive widespread edema is noted Neurologic:  Normal speech and language. No gross focal neurologic deficits are appreciated.  Skin:  Skin is warm, dry and intact.  Psychiatric: Mood and affect are normal. Speech and behavior are normal.  ____________________________________________  EKG: Interpreted by me.  Sinus rhythm rate 70 bpm, normal PR interval, low voltage QRS, normal axis.  ____________________________________________  ED COURSE:  As part of my medical decision making, I reviewed the following data within the [place] History obtained from family if available, nursing notes, old chart and ekg, as well as notes from prior ED visits. Patient presented for dyspnea, we will assess with labs and imaging as indicated at this time.   Procedures ____________________________________________   LABS (pertinent positives/negatives)  Labs Reviewed  CBC WITH DIFFERENTIAL/PLATELET - Abnormal; Notable for the following components:      Result Value   RBC 3.58 (*)    Hemoglobin 9.5 (*)    HCT 30.3 (*)    MCHC 31.4 (*)    RDW 16.1 (*)    Lymphs Abs 0.8 (*)    All other components within normal limits  BRAIN NATRIURETIC PEPTIDE - Abnormal; Notable for the following components:   B Natriuretic Peptide 617.0 (*)    All other components within normal limits  COMPREHENSIVE METABOLIC PANEL - Abnormal; Notable for the following components:   Chloride 98 (*)    Glucose, Bld 168 (*)    BUN 28 (*)    Creatinine, Ser 2.14 (*)    Calcium 7.7 (*)    Total Protein 6.3 (*)    Albumin 2.3 (*)    Alkaline Phosphatase 297 (*)    GFR calc  non Af Amer 27 (*)    GFR calc Af Amer 31 (*)    All other components within normal limits  BLOOD GAS, VENOUS - Abnormal; Notable for the following components:   pCO2, Ven 61 (*)    Bicarbonate 36.1 (*)    Acid-Base Excess 9.4 (*)    All other components within normal limits  TROPONIN I    RADIOLOGY Images were viewed by me  Chest x-ray IMPRESSION: Findings consistent with congestive heart failure. Improvement in pulmonary edema. Bilateral pleural effusions and bibasilar atelectasis, with some progression of left lower lobe airspace disease and effusions since the prior study. ____________________________________________  DIFFERENTIAL DIAGNOSIS   CHF, pulmonary edema, pericardial effusion, PE, pneumothorax, pneumonia  FINAL ASSESSMENT AND PLAN  Dyspnea, congestive heart failure   Plan: Patient had presented for dyspnea. [name] labs are stable. [name] imaging does reveal  findings of congestive heart failure.  Patient feels too short of breath to go home.  She does appear to be significantly edematous.  We will order IV diuretics and I will discussed with the hospitalist for admission.   Earleen Newport, MD   Note: This note was generated in part or whole with voice recognition software. Voice recognition is usually quite accurate but there are transcription errors that can and very often do occur. I apologize for any typographical errors that were not detected and corrected.     Earleen Newport, MD 03/03/17 [phone]

## 2017-03-03 NOTE — Telephone Encounter (Signed)
Patient called to say that she felt too weak to come to her appointment today. She said that she had increased her torsemide to 40mg  BID per our previous conversation but doesn't think it's helped any. She does admit that she hadn't taken any diuretic yesterday because her mouth was dry and hasn't taken any diuretic yet today. She says that her pants are very tight and she's unable to get her shoes on and doesn't feel like her legs will even hold her up.  She was supposed to come to her appointment today but says that she can't make it because of the edema and weakness. Advised her that if she can't make it to her appointment that she probably needed to call 911 so that she can be brought to the hospital for IV diuretics. Patient says that that is what she will do. Will make a follow-up appointment pending her hospital visit.

## 2017-03-03 NOTE — Clinical Social Work Note (Signed)
CSW received consult for patient needing assistance with medications, case manager can assist with this, CSW to sign off please reconsult if social work needs arise.  Jones Broom. [place], MSW, [place]  03/03/2017 4:13 PM

## 2017-03-03 NOTE — ED Notes (Signed)
Iv team able to place iv; could not get blood; lab called to draw blood. They will send someone.

## 2017-03-03 NOTE — Care Management (Signed)
Patient has not completed the applications for [facility].  There is a consult for medication assistance.  Patient will need to take active role in her care planning in order to receive the resources she needs. Discussed and provided applications

## 2017-03-03 NOTE — ED Triage Notes (Signed)
Pt from home via ems with increasing fluid retention and sob x 3 weeks. Pt was going to heart failure clinic this morning but was told to come here instead. Pt has had her turosemide increased over past few days, but still gaining weight. Pt has not weighed, but states she can't get her clothes or shoes on. Pt alert & oriented; breathless after 10-15 words.

## 2017-03-03 NOTE — Plan of Care (Signed)
  Progressing Education: Ability to demonstrate management of disease process will improve 03/03/2017 2250 - Progressing by Loran Senters, RN Ability to verbalize understanding of medication therapies will improve 03/03/2017 2250 - Progressing by Loran Senters, RN

## 2017-03-03 NOTE — H&P (Signed)
[facility] [place] NAME: [name]    MR#:  [id number]  DATE OF BIRTH:  07-26-1970  DATE OF ADMISSION:  03/03/2017  PRIMARY CARE PHYSICIAN: [name], NP   REQUESTING/REFERRING PHYSICIAN: Dr. Jimmye Norman  CHIEF COMPLAINT:  Increasing shortness of breath weight gain clothes and shoes not fitting properly  HISTORY OF PRESENT ILLNESS:  [name]  is a 47 y.o. female with a known history of morbid obesity, diastolic congestive heart failure, chronic, hypertension, CKD stage III, type 2 diabetes comes to the emergency room with increasing shortness of breath and weight gain which patient tells me she is not fitting into her clothes and shoes.  Patient does not remember her dry weight. Workup in the ER showed patient is in congestive heart failure acute on chronic diastolic chest x-ray shows pulmonary venous congestion and BNP is elevated to 671.  She received IV Lasix she has been admitted for further rash management.  PAST MEDICAL HISTORY:   Past Medical History:  Diagnosis Date  . (HFpEF) heart failure with preserved ejection fraction ([place])    a. 11/2014 Echo: EF 50-55%, Gr1 DD; b. 06/2016 Echo: EF 60-65%, no rwma, mod dil LA, nl RV, triv eff.  . Asthma   . CKD (chronic kidney disease), stage III ([place])   . COPD (chronic obstructive pulmonary disease) ([place])    a. Home O2.  . Diabetes mellitus without complication ([place])   . Hypertension   . Iron deficiency anemia    a. chronic blood loss->heavy menses.  . Morbid obesity ([place])   . Pericardial effusion    a. 11/2014 CT Chest: Mod effusion; b. 11/2014 Echo: small to mod circumferential effusion. No hemodynamic compromise.  [name] Poorly controlled diabetes mellitus ([place])    a. 11/2014 A1c 13.2.    PAST SURGICAL HISTOIRY:   Past Surgical History:  Procedure Laterality Date  . CESAREAN SECTION     times 3    SOCIAL HISTORY:   Social History   Tobacco Use  . Smoking  status: Never Smoker  . Smokeless tobacco: Never Used  Substance Use Topics  . Alcohol use: Yes    Alcohol/week: 0.0 oz    Comment: occ drink - <1/wk    FAMILY HISTORY:   Family History  Problem Relation Age of Onset  . Diabetes Mother   . Hypertension Mother   . Hypertension Father   . Diabetes Father   . Hypertension Unknown   . Diabetes Unknown   . Breast cancer Maternal Aunt 1  . Breast cancer Maternal Aunt 50    DRUG ALLERGIES:   Allergies  Allergen Reactions  . Dust Mite Extract Shortness Of Breath and Itching  . Mold Extract [Trichophyton] Shortness Of Breath and Itching  . Feraheme [Ferumoxytol] Itching  . Pollen Extract   . Sulfa Antibiotics Itching    REVIEW OF SYSTEMS:  Review of Systems  Constitutional: Negative for chills, fever and weight loss.  HENT: Negative for ear discharge, ear pain and nosebleeds.   Eyes: Negative for blurred vision, pain and discharge.  Respiratory: Positive for shortness of breath and wheezing. Negative for sputum production and stridor.   Cardiovascular: Positive for leg swelling and PND. Negative for chest pain, palpitations and orthopnea.  Gastrointestinal: Negative for abdominal pain, diarrhea, nausea and vomiting.  Genitourinary: Negative for frequency and urgency.  Musculoskeletal: Negative for back pain and joint pain.  Neurological: Positive for weakness. Negative for sensory change, speech change and  focal weakness.  Psychiatric/Behavioral: Negative for depression and hallucinations. The patient is not nervous/anxious.      MEDICATIONS AT HOME:   Prior to Admission medications   Medication Sig Start Date End Date Taking? Authorizing Provider  acetaminophen (TYLENOL) 500 MG tablet Take 1,000 mg by mouth every 6 (six) hours as needed for mild pain, fever or headache.   Yes [provider]  albuterol (PROVENTIL HFA;VENTOLIN HFA) 108 (90 Base) MCG/ACT inhaler Inhale 2 puffs into the lungs every 6 (six) hours as  needed for wheezing or shortness of breath. 01/21/17  Yes Laverle Hobby, MD  albuterol (PROVENTIL) (2.5 MG/3ML) 0.083% nebulizer solution Take 2.5 mg by nebulization every 6 (six) hours as needed for wheezing or shortness of breath.   Yes [provider]  aspirin EC 81 MG tablet Take 81 mg by mouth daily.    Yes [provider]  atorvastatin (LIPITOR) 80 MG tablet Take 1 tablet (80 mg total) by mouth daily at 6 PM. 11/16/16  Yes Mody, Sital, MD  Cholecalciferol (HM VITAMIN D3) 4000 units CAPS Take 1 capsule by mouth once a week.   Yes [provider]  citalopram (CELEXA) 40 MG tablet Take 1 tablet (40 mg total) by mouth daily. 11/16/16  Yes Mody, Ulice Bold, MD  cloNIDine (CATAPRES) 0.2 MG tablet Take 1 tablet (0.2 mg total) by mouth 2 (two) times daily. 01/23/17  Yes [name] A, FNP  hydrALAZINE (APRESOLINE) 100 MG tablet Take 1 tablet (100 mg total) by mouth every 8 (eight) hours. 11/16/16  Yes Mody, Ulice Bold, MD  insulin aspart (NOVOLOG FLEXPEN) 100 UNIT/ML FlexPen Inject 0-12 Units into the skin 3 (three) times daily with meals as needed for high blood sugar. Pt uses as needed per sliding scale. 11/16/16  Yes Mody, Ulice Bold, MD  losartan (COZAAR) 50 MG tablet Take 50 mg daily by mouth.   Yes [provider]  metoprolol tartrate (LOPRESSOR) 100 MG tablet Take 1 tablet (100 mg total) by mouth 2 (two) times daily. 11/16/16  Yes Mody, Ulice Bold, MD  torsemide (DEMADEX) 20 MG tablet Take 2 tablets (40 mg total) by mouth daily. 11/16/16  Yes Mody, Ulice Bold, MD  guaiFENesin-dextromethorphan (ROBITUSSIN DM) 100-10 MG/5ML syrup Take 5 mLs every 4 (four) hours as needed by mouth for cough. 01/10/17   Demetrios Loll, MD  ipratropium-albuterol (DUONEB) 0.5-2.5 (3) MG/3ML SOLN Take 3 mLs by nebulization every 6 (six) hours as needed. 01/20/17   Laverle Hobby, MD  polyethylene glycol (MIRALAX / GLYCOLAX) packet Take 17 g by mouth daily as needed for mild constipation. 06/29/16    Vaughan Basta, MD  potassium chloride (K-DUR) 10 MEQ tablet Take 1 tablet (10 mEq total) by mouth 2 (two) times daily. Patient not taking: Reported on 03/03/2017 11/16/16   Bettey Costa, MD  predniSONE (STERAPRED UNI-PAK 21 TAB) 10 MG (21) TBPK tablet Take as directed Patient not taking: Reported on 03/03/2017 01/20/17   Laverle Hobby, MD      VITAL SIGNS:  Blood pressure 132/78, pulse 67, temperature 98.2 F (36.8 C), temperature source Oral, resp. rate 10, height 5\' 4"  (1.626 m), weight (!) 161 kg (355 lb), last menstrual period 10/14/2012, SpO2 93 %.  PHYSICAL EXAMINATION:  GENERAL:  47 y.o.-year-old patient lying in the bed with no acute distress.  Morbidly obese EYES: Pupils equal, round, reactive to light and accommodation. No scleral icterus. Extraocular muscles intact.  HEENT: Head atraumatic, normocephalic. Oropharynx and nasopharynx clear.  NECK:  Supple, no jugular venous distention. No thyroid  enlargement, no tenderness.  LUNGS: distant breath sounds bilaterally, no wheezing, rales,rhonchi or crepitation. No use of accessory muscles of respiration.  CARDIOVASCULAR: S1, S2 normal. No murmurs, rubs, or gallops.  ABDOMEN: Soft, nontender, nondistended. Bowel sounds present. No organomegaly or mass.  EXTREMITIES: Chronic pedal edema 3+  nEUROLOGIC: Cranial nerves II through XII are intact. Muscle strength 5/5 in all extremities. Sensation intact. Gait not checked.  PSYCHIATRIC: The patient is alert and oriented x 3.  SKIN: Poor hygiene, dry skin  LABORATORY PANEL:   CBC Recent Labs  Lab 03/03/17 1114  WBC 7.4  HGB 9.5*  HCT 30.3*  PLT 380   ------------------------------------------------------------------------------------------------------------------  Chemistries  Recent Labs  Lab 03/03/17 1114  NA 135  K 4.1  CL 98*  CO2 31  GLUCOSE 168*  BUN 28*  CREATININE 2.14*  CALCIUM 7.7*  AST 26  ALT 16  ALKPHOS 297*  BILITOT 0.6    ------------------------------------------------------------------------------------------------------------------  Cardiac Enzymes Recent Labs  Lab 03/03/17 1114  TROPONINI <0.03   ------------------------------------------------------------------------------------------------------------------  RADIOLOGY:  Dg Chest 2 View  Result Date: 03/03/2017 CLINICAL DATA:  Dyspnea.  Short of breath and fluid retention EXAM: CHEST  2 VIEW COMPARISON:  01/07/2017 FINDINGS: Hypoventilation with decreased lung volume. Cardiac enlargement with pulmonary vascular congestion. Negative for edema. Right pleural effusion and right lower lobe airspace disease unchanged. Progression of left effusion and left lower lobe airspace disease. IMPRESSION: Findings consistent with congestive heart failure. Improvement in pulmonary edema. Bilateral pleural effusions and bibasilar atelectasis, with some progression of left lower lobe airspace disease and effusions since the prior study. Electronically Signed   By: Franchot Gallo M.D.   On: 03/03/2017 10:55    EKG:    IMPRESSION AND PLAN:   [name]  is a 47 y.o. female with a known history of morbid obesity, diastolic congestive heart failure, chronic, hypertension, CKD stage III, type 2 diabetes comes to the emergency room with increasing shortness of breath and weight gain which patient tells me she is not fitting into her clothes and shoes.  Patient does not remember her dry weight.  1.  Acute on chronic diastolic congestive heart failure -Admit to telemetry -IV Lasix 40 twice daily, strict I's and O's, avoid nephrotoxins, -Patient already has echo done in the past. -Cardiology consultation -2 g sodium diet  2.  DM 2 -Sliding scale insulin  3.  Morbid obesity with possible underlying sleep apnea  4.  CKD stage III Creatinine slightly increased.  Patient will hold off losartan for now.  Patient is going to be diuresed and creatinine may go  up. -Resume losartan once creatinine is stable  5.  DVT prophylaxis heparin    All the records are reviewed and case discussed with ED provider. Management plans discussed with the patient, family and they are in agreement.  CODE STATUS: Full  TOTAL TIME TAKING CARE OF THIS PATIENT: 50 minutes.    Fritzi Mandes M.D on 03/03/2017 at 2:23 PM  Between 7am to 6pm - Pager - [phone]  After 6pm go to [email or web address] - password [email or web address] [facility]  [facility]  Office  [phone]  CC: Primary care physician; [name], NP

## 2017-03-03 NOTE — Care Management Note (Signed)
Case Management Note  Patient Details  Name: [name] MRN: [id number] Date of Birth: 06-Dec-1970  Subjective/Objective:                 Patient has had 3 admissions within past 6 months for heart failure.  She is followed by [facility] but canceled her appointment in December and felt too bad to keep her appointment 1/7.   It sounds as though patient may not always be totally compliant with her meds.  It is documented by [facility] that patient did not take her diuretics 1/6 because her mouth was dry.  There is another note that [facility] nurse contacted [facility] on 1/3 to report blood pressure very high.  As of 11 AM patient had not taken her morning meds. Patient is followed by [facility] - nursing.  Patient has scales.  Have reached out to [facility] to determine if current and in good standing. Reached out to [name] in regards to [facility] application initiated in September 2018. last contact 02/26/2017 Advised patient DDS Decision is still pending per to [place].  Also advised patient and spouse to provide the following information to [place] requesting: July, aug, sept bank stmts and Spouse: [name] proof of income for the month of August 2018 due by March 06, 2017.   Patient stated  She will return documents to her caseworker directed at [place] this week."      Action/Plan: Reached out to [facility] questioning how medication administration is assessed.  Expected Discharge Date:                  Expected Discharge Plan:     In-House Referral:     Discharge planning Services     Post Acute Care Choice:    Choice offered to:     DME Arranged:    DME Agency:     HH Arranged:    HH Agency:     Status of Service:     If discussed at [employer], dates discussed:    Additional Comments:  Katrina Stack, RN 03/03/2017, 3:51 PM

## 2017-03-04 LAB — GLUCOSE, CAPILLARY
GLUCOSE-CAPILLARY: 160 mg/dL — AB (ref 65–99)
GLUCOSE-CAPILLARY: 166 mg/dL — AB (ref 65–99)
GLUCOSE-CAPILLARY: 167 mg/dL — AB (ref 65–99)
GLUCOSE-CAPILLARY: 181 mg/dL — AB (ref 65–99)

## 2017-03-04 LAB — BASIC METABOLIC PANEL
ANION GAP: 6 (ref 5–15)
BUN: 31 mg/dL — ABNORMAL HIGH (ref 6–20)
CALCIUM: 7.6 mg/dL — AB (ref 8.9–10.3)
CHLORIDE: 98 mmol/L — AB (ref 101–111)
CO2: 30 mmol/L (ref 22–32)
Creatinine, Ser: 2.29 mg/dL — ABNORMAL HIGH (ref 0.44–1.00)
GFR calc non Af Amer: 25 mL/min — ABNORMAL LOW (ref >60)
GFR, EST AFRICAN AMERICAN: 29 mL/min — AB (ref >60)
Glucose, Bld: 176 mg/dL — ABNORMAL HIGH (ref 65–99)
Potassium: 4.3 mmol/L (ref 3.5–5.1)
Sodium: 134 mmol/L — ABNORMAL LOW (ref 135–145)

## 2017-03-04 LAB — MRSA PCR SCREENING: MRSA BY PCR: POSITIVE — AB

## 2017-03-04 MED ORDER — DIPHENHYDRAMINE HCL 25 MG PO CAPS
25.0000 mg | ORAL_CAPSULE | Freq: Four times a day (QID) | ORAL | Status: DC | PRN
  Administered 2017-03-04 – 2017-03-09 (×3): 25 mg via ORAL
  Filled 2017-03-04 (×3): qty 1

## 2017-03-04 MED ORDER — POTASSIUM CHLORIDE CRYS ER 10 MEQ PO TBCR
10.0000 meq | EXTENDED_RELEASE_TABLET | Freq: Once | ORAL | Status: AC
  Administered 2017-03-04: 10 meq via ORAL
  Filled 2017-03-04: qty 1

## 2017-03-04 MED ORDER — SODIUM CHLORIDE 0.9% FLUSH
3.0000 mL | Freq: Two times a day (BID) | INTRAVENOUS | Status: DC
  Administered 2017-03-04 – 2017-03-10 (×13): 3 mL via INTRAVENOUS

## 2017-03-04 MED ORDER — BISACODYL 5 MG PO TBEC: 5.0000 mg | DELAYED_RELEASE_TABLET | Freq: Every day | ORAL | Status: DC | PRN

## 2017-03-04 MED ORDER — FUROSEMIDE 10 MG/ML IJ SOLN
80.0000 mg | Freq: Three times a day (TID) | INTRAMUSCULAR | Status: DC
  Administered 2017-03-04 – 2017-03-05 (×3): 80 mg via INTRAVENOUS
  Filled 2017-03-04 (×3): qty 8

## 2017-03-04 MED ORDER — CHLORHEXIDINE GLUCONATE CLOTH 2 % EX PADS
6.0000 | MEDICATED_PAD | Freq: Every day | CUTANEOUS | Status: AC
  Administered 2017-03-05 – 2017-03-06 (×2): 6 via TOPICAL

## 2017-03-04 MED ORDER — METOLAZONE 5 MG PO TABS
5.0000 mg | ORAL_TABLET | Freq: Once | ORAL | Status: AC
  Administered 2017-03-04: 5 mg via ORAL
  Filled 2017-03-04: qty 1

## 2017-03-04 MED ORDER — ALBUMIN HUMAN 25 % IV SOLN
25.0000 g | Freq: Three times a day (TID) | INTRAVENOUS | Status: AC
  Administered 2017-03-04 – 2017-03-08 (×13): 25 g via INTRAVENOUS
  Filled 2017-03-04 (×16): qty 100

## 2017-03-04 MED ORDER — MUPIROCIN 2 % EX OINT
1.0000 "application " | TOPICAL_OINTMENT | Freq: Two times a day (BID) | CUTANEOUS | Status: AC
  Administered 2017-03-04 – 2017-03-08 (×5): 1 via NASAL
  Filled 2017-03-04 (×2): qty 22

## 2017-03-04 NOTE — Progress Notes (Signed)
Bladder scan 487mls/ Dr. Bridgett Larsson paged to make aware/ orders to insert foley due to acute urinary retention

## 2017-03-04 NOTE — Care Management (Signed)
Per [facility] home health nurse, patient does not allow home health to set up pill box to enable closer monitoring of medication administration.  Agency reports continued noncompliance with medical regimes and MD appointments.  Agency will not be able to continued to follow patient if she continues with her noncompliance.  CM provided patient with another application for [facility]. Informed that if completed it while inpatient, CM would fax.  Patient defers this to her husband.  She tells CM she is getting her meds from [facility]. CM informed her that clinic only has filled once in Nov 2018. Patient verbalized agreement.

## 2017-03-04 NOTE — Progress Notes (Signed)
[facility]  ROUNDING NOTE   Subjective:   [name] admitted to [facility] on Dyspnea, unspecified type [R06.00] on 03/03/2017   Patient has been having progressive weight gain for several months. Outpatient diuretics of torsemide and did try metolazone.   Husband at bedside.   Objective:  Vital signs in last 24 hours:  Temp:  [97.3 F (36.3 C)-97.9 F (36.6 C)] 97.3 F (36.3 C) (01/08 0828) Pulse Rate:  [67-95] 95 (01/08 0828) Resp:  [10-20] 18 (01/08 0828) BP: (120-148)/(63-89) 127/73 (01/08 0828) SpO2:  [93 %-99 %] 98 % (01/08 0828) Weight:  [183 kg (403 lb 8 oz)-183.4 kg (404 lb 4.8 oz)] 183.4 kg (404 lb 4.8 oz) (01/08 0509)  Weight change:  Filed Weights   03/03/17 1025 03/03/17 1534 03/04/17 0509  Weight: (!) 161 kg (355 lb) (!) 183 kg (403 lb 8 oz) (!) 183.4 kg (404 lb 4.8 oz)    Intake/Output: I/O last 3 completed shifts: In: 240 [P.O.:240] Out: -    Intake/Output this shift:  No intake/output data recorded.  Physical Exam: General: NAD, sitting up in bed  Head: Normocephalic, atraumatic. Moist oral mucosal membranes  Eyes: Anicteric, PERRL  Neck: Supple, trachea midline  Lungs:  Bilateral crackles  Heart: Regular rate and rhythm  Abdomen:  Soft, nontender, ++abdominal wall edema  Extremities: 1+ peripheral edema.  Neurologic: Nonfocal, moving all four extremities  Skin: No lesions       Basic Metabolic Panel: Recent Labs  Lab 03/03/17 1114 03/04/17 0603  NA 135 134*  K 4.1 4.3  CL 98* 98*  CO2 31 30  GLUCOSE 168* 176*  BUN 28* 31*  CREATININE 2.14* 2.29*  CALCIUM 7.7* 7.6*    Liver Function Tests: Recent Labs  Lab 03/03/17 1114  AST 26  ALT 16  ALKPHOS 297*  BILITOT 0.6  PROT 6.3*  ALBUMIN 2.3*   No results for input(s): LIPASE, AMYLASE in the last 168 hours. No results for input(s): AMMONIA in the last 168 hours.  CBC: Recent Labs  Lab 03/03/17 1114  WBC 7.4  NEUTROABS 5.8  HGB 9.5*  HCT 30.3*  MCV 84.8   PLT 380    Cardiac Enzymes: Recent Labs  Lab 03/03/17 1114  TROPONINI <0.03    BNP: Invalid input(s): POCBNP  CBG: Recent Labs  Lab 03/03/17 1533 03/03/17 1652 03/03/17 2226 03/04/17 0829  GLUCAP 149* 149* 191* 160*    Microbiology: Results for orders placed or performed during the hospital encounter of 03/03/17  MRSA PCR Screening     Status: Abnormal   Collection Time: 03/04/17  9:24 AM  Result Value Ref Range Status   MRSA by PCR POSITIVE (A) NEGATIVE Final    Comment:        The GeneXpert MRSA Assay (FDA approved for NASAL specimens only), is one component of a comprehensive MRSA colonization surveillance program. It is not intended to diagnose MRSA infection nor to guide or monitor treatment for MRSA infections. CRITICAL RESULT CALLED TO, READ BACK BY AND VERIFIED WITH: JESSICA CHRISTMAS 03/04/17 1141 KLW Performed at [facility], [address] [place], [place] [address]     Coagulation Studies: No results for input(s): LABPROT, INR in the last 72 hours.  Urinalysis: No results for input(s): COLORURINE, LABSPEC, PHURINE, GLUCOSEU, HGBUR, BILIRUBINUR, KETONESUR, PROTEINUR, UROBILINOGEN, NITRITE, LEUKOCYTESUR in the last 72 hours.  Invalid input(s): APPERANCEUR    Imaging: Dg Chest 2 View  Result Date: 03/03/2017 CLINICAL DATA:  Dyspnea.  Short  of breath and fluid retention EXAM: CHEST  2 VIEW COMPARISON:  01/07/2017 FINDINGS: Hypoventilation with decreased lung volume. Cardiac enlargement with pulmonary vascular congestion. Negative for edema. Right pleural effusion and right lower lobe airspace disease unchanged. Progression of left effusion and left lower lobe airspace disease. IMPRESSION: Findings consistent with congestive heart failure. Improvement in pulmonary edema. Bilateral pleural effusions and bibasilar atelectasis, with some progression of left lower lobe airspace disease and effusions since the prior study. Electronically Signed    By: Franchot Gallo M.D.   On: 03/03/2017 10:55   Dg Abd 2 Views  Result Date: 03/03/2017 CLINICAL DATA:  Shortness of breath and weight gain. EXAM: ABDOMEN - 2 VIEW COMPARISON:  PA and lateral chest this same day. FINDINGS: Right pleural effusion and basilar airspace disease are seen. No free intraperitoneal air is identified. The bowel gas pattern is nonobstructive. No abnormal abdominal calcification or focal bony abnormality. IMPRESSION: Negative abdomen. Right pleural effusion and basilar airspace disease. Electronically Signed   By: Inge Rise M.D.   On: 03/03/2017 15:02     Medications:   . albumin human     . aspirin EC  81 mg Oral Daily  . atorvastatin  80 mg Oral q1800  . cholecalciferol  4,000 Units Oral Weekly  . citalopram  40 mg Oral Daily  . cloNIDine  0.2 mg Oral BID  . furosemide  80 mg Intravenous Q8H  . heparin  5,000 Units Subcutaneous Q8H  . hydrALAZINE  100 mg Oral Q8H  . insulin aspart  0-9 Units Subcutaneous TID WC  . metoprolol tartrate  100 mg Oral BID   acetaminophen **OR** acetaminophen, albuterol, bisacodyl, diphenhydrAMINE, ondansetron **OR** ondansetron (ZOFRAN) IV, polyethylene glycol, polyethylene glycol, traMADol  Assessment/ Plan:  [name] is a 47 y.o. black female with morbid obesity, insulin-dependent diabetes mellitus type II, obesity, anemia, diastolic congestive heart failure, COPD/asthma, hypertension,   1. Acute renal failure on chronic kidney disease stage III with nephrotic range proteinuria: baseline creatinine 2.23, GFR of 29 on 01/10/17 Chronic kidney disease with proteinuria secondary to diabetic nephropathy, hypertension Acute renal failure from acute cardiorenal syndrome  Low albumin - IV furosemide  - metolazone  - start albumin   2. Acute exacerbation of diastolic congestive heart failure with hypertension  3. Diabetes mellitus type II with chronic kidney disease: insulin dependent - check hemoglobin A1c.     LOS: [place], [place] 1/8/201911:42 AM

## 2017-03-04 NOTE — Progress Notes (Signed)
[facility] [place] NAME: [name]    MR#:  [id number]  DATE OF BIRTH:  07-26-1970  SUBJECTIVE:  CHIEF COMPLAINT:   Chief Complaint  Patient presents with  . Shortness of Breath   Due to shortness of breath and leg swelling. REVIEW OF SYSTEMS:  Review of Systems  Constitutional: Positive for malaise/fatigue. Negative for chills and fever.  HENT: Negative for sore throat.   Eyes: Negative for blurred vision and double vision.  Respiratory: Positive for cough and shortness of breath. Negative for hemoptysis, wheezing and stridor.   Cardiovascular: Positive for leg swelling. Negative for chest pain, palpitations and orthopnea.  Gastrointestinal: Negative for abdominal pain, blood in stool, diarrhea, melena, nausea and vomiting.  Genitourinary: Negative for dysuria, flank pain and hematuria.  Musculoskeletal: Negative for back pain and joint pain.  Neurological: Negative for dizziness, sensory change, focal weakness, seizures, loss of consciousness, weakness and headaches.  Endo/Heme/Allergies: Negative for polydipsia.  Psychiatric/Behavioral: Negative for depression. The patient is not nervous/anxious.     DRUG ALLERGIES:   Allergies  Allergen Reactions  . Dust Mite Extract Shortness Of Breath and Itching  . Mold Extract [Trichophyton] Shortness Of Breath and Itching  . Feraheme [Ferumoxytol] Itching  . Pollen Extract   . Sulfa Antibiotics Itching   VITALS:  Blood pressure 127/73, pulse 95, temperature (!) 97.3 F (36.3 C), temperature source Oral, resp. rate 18, height 5\' 4"  (1.626 m), weight (!) 404 lb 4.8 oz (183.4 kg), last menstrual period 10/14/2012, SpO2 98 %. PHYSICAL EXAMINATION:  Physical Exam  Constitutional: She is oriented to person, place, and time and well-developed, well-nourished, and in no distress.  Morbid obesity.  HENT:  Head: Normocephalic.  Mouth/Throat: Oropharynx is clear and moist.  Eyes:  Conjunctivae and EOM are normal. Pupils are equal, round, and reactive to light. No scleral icterus.  Neck: Normal range of motion. Neck supple. No JVD present. No tracheal deviation present.  Cardiovascular: Normal rate, regular rhythm and normal heart sounds. Exam reveals no gallop.  No murmur heard. Pulmonary/Chest: Effort normal. No respiratory distress. She has no wheezes. She has rales.  Diminished lung sounds  Abdominal: Soft. Bowel sounds are normal. She exhibits distension. There is no tenderness. There is no rebound.  Musculoskeletal: She exhibits edema. She exhibits no tenderness.  Bilateral leg chronic skin changes with edema.  Neurological: She is alert and oriented to person, place, and time. No cranial nerve deficit.  Skin: No rash noted. No erythema.  Psychiatric: Affect normal.   LABORATORY PANEL:  Female CBC Recent Labs  Lab 03/03/17 1114  WBC 7.4  HGB 9.5*  HCT 30.3*  PLT 380   ------------------------------------------------------------------------------------------------------------------ Chemistries  Recent Labs  Lab 03/03/17 1114 03/04/17 0603  NA 135 134*  K 4.1 4.3  CL 98* 98*  CO2 31 30  GLUCOSE 168* 176*  BUN 28* 31*  CREATININE 2.14* 2.29*  CALCIUM 7.7* 7.6*  AST 26  --   ALT 16  --   ALKPHOS 297*  --   BILITOT 0.6  --    RADIOLOGY:  Dg Abd 2 Views  Result Date: 03/03/2017 CLINICAL DATA:  Shortness of breath and weight gain. EXAM: ABDOMEN - 2 VIEW COMPARISON:  PA and lateral chest this same day. FINDINGS: Right pleural effusion and basilar airspace disease are seen. No free intraperitoneal air is identified. The bowel gas pattern is nonobstructive. No abnormal abdominal calcification or focal bony abnormality. IMPRESSION: Negative abdomen. Right pleural  effusion and basilar airspace disease. Electronically Signed   By: Inge Rise M.D.   On: 03/03/2017 15:02   ASSESSMENT AND PLAN:   [name]  is a 47 y.o. female with a known history  of morbid obesity, diastolic congestive heart failure, chronic, hypertension, CKD stage III, type 2 diabetes comes to the emergency room with increasing shortness of breath and weight gain which patient tells me she is not fitting into her clothes and shoes.  Patient does not remember her dry weight.  1.  Acute on chronic diastolic congestive heart failure, LV EF: 55% Increased Lasix 80 twice daily and may need Lasix drip per cardiology consult.  Metolazone.  2.  DM 2 -Sliding scale insulin  3.  Morbid obesity with possible underlying sleep apnea  4.  CKD stage III Creatinine slightly increased.  hold losartan-Resume losartan once creatinine is stable Start albumin per Dr. Juleen China.  5.  Anemia of chronic disease.  Stable.  Discussed with cardiologist PA Mr. Idolina Primer. All the records are reviewed and case discussed with Care Management/Social Worker. Management plans discussed with the patient, family and they are in agreement.  CODE STATUS: Full Code  TOTAL TIME TAKING CARE OF THI S PATIENT: 37 minutes.   More than 50% of the time was spent in counseling/coordination of care: YES  POSSIBLE D/C IN 3 DAYS, DEPENDING ON CLINICAL CONDITION.   Demetrios Loll M.D on 03/04/2017 at 1:34 PM  Between 7am to 6pm - Pager - [phone]  After 6pm go to [email or web address] - [facility]

## 2017-03-04 NOTE — Discharge Instructions (Signed)
[facility] appointment on March 11 2017 at 10:40am with [name], [place]. Please call [phone] to reschedule.

## 2017-03-05 DIAGNOSIS — N183 Chronic kidney disease, stage 3 (moderate): Secondary | ICD-10-CM

## 2017-03-05 DIAGNOSIS — N179 Acute kidney failure, unspecified: Secondary | ICD-10-CM

## 2017-03-05 LAB — BASIC METABOLIC PANEL
ANION GAP: 10 (ref 5–15)
BUN: 34 mg/dL — ABNORMAL HIGH (ref 6–20)
CHLORIDE: 97 mmol/L — AB (ref 101–111)
CO2: 26 mmol/L (ref 22–32)
Calcium: 8 mg/dL — ABNORMAL LOW (ref 8.9–10.3)
Creatinine, Ser: 2.52 mg/dL — ABNORMAL HIGH (ref 0.44–1.00)
GFR calc Af Amer: 25 mL/min — ABNORMAL LOW (ref >60)
GFR calc non Af Amer: 22 mL/min — ABNORMAL LOW (ref >60)
Glucose, Bld: 137 mg/dL — ABNORMAL HIGH (ref 65–99)
POTASSIUM: 4.8 mmol/L (ref 3.5–5.1)
Sodium: 133 mmol/L — ABNORMAL LOW (ref 135–145)

## 2017-03-05 LAB — GLUCOSE, CAPILLARY
GLUCOSE-CAPILLARY: 140 mg/dL — AB (ref 65–99)
GLUCOSE-CAPILLARY: 132 mg/dL — AB (ref 65–99)
GLUCOSE-CAPILLARY: 132 mg/dL — AB (ref 65–99)
Glucose-Capillary: 118 mg/dL — ABNORMAL HIGH (ref 65–99)

## 2017-03-05 LAB — HEMOGLOBIN A1C
Hgb A1c MFr Bld: 7.3 % — ABNORMAL HIGH (ref 4.8–5.6)
Mean Plasma Glucose: 162.81 mg/dL

## 2017-03-05 MED ORDER — GUAIFENESIN 100 MG/5ML PO SOLN: 5.0000 mL | ORAL | Status: DC | PRN

## 2017-03-05 MED ORDER — DOCUSATE SODIUM 100 MG PO CAPS
200.0000 mg | ORAL_CAPSULE | Freq: Two times a day (BID) | ORAL | Status: DC
  Administered 2017-03-05 – 2017-03-10 (×10): 200 mg via ORAL
  Filled 2017-03-05 (×11): qty 2

## 2017-03-05 MED ORDER — GUAIFENESIN 100 MG/5ML PO SOLN
10.0000 mL | Freq: Four times a day (QID) | ORAL | Status: DC | PRN
  Administered 2017-03-05 (×2): 200 mg via ORAL
  Filled 2017-03-05 (×3): qty 10

## 2017-03-05 MED ORDER — FUROSEMIDE 10 MG/ML IJ SOLN
120.0000 mg | Freq: Three times a day (TID) | INTRAVENOUS | Status: DC
  Administered 2017-03-05 – 2017-03-06 (×3): 120 mg via INTRAVENOUS
  Filled 2017-03-05: qty 10
  Filled 2017-03-05 (×3): qty 12
  Filled 2017-03-05: qty 10

## 2017-03-05 MED ORDER — BISACODYL 5 MG PO TBEC: 10.0000 mg | DELAYED_RELEASE_TABLET | Freq: Every day | ORAL | Status: DC | PRN

## 2017-03-05 NOTE — Progress Notes (Signed)
[facility]  ROUNDING NOTE   Subjective:   Husband at bedside.  Foley catheter placed  Furosemide increased 120mg  IV q8.   Objective:  Vital signs in last 24 hours:  Temp:  [98.1 F (36.7 C)-98.7 F (37.1 C)] 98.7 F (37.1 C) (01/09 0741) Pulse Rate:  [65-77] 70 (01/09 0741) Resp:  [16-18] 18 (01/09 0741) BP: (93-150)/(54-98) 135/69 (01/09 0741) SpO2:  [94 %-98 %] 98 % (01/09 0741) Weight:  [182.7 kg (402 lb 11.2 oz)] 182.7 kg (402 lb 11.2 oz) (01/09 0518)  Weight change: 21.6 kg (47 lb 11.2 oz) Filed Weights   03/03/17 1534 03/04/17 0509 03/05/17 0518  Weight: (!) 183 kg (403 lb 8 oz) (!) 183.4 kg (404 lb 4.8 oz) (!) 182.7 kg (402 lb 11.2 oz)    Intake/Output: I/O last 3 completed shifts: In: 300 [IV Piggyback:300] Out: 3474 [Urine:1470]   Intake/Output this shift:  No intake/output data recorded.  Physical Exam: General: NAD, sitting up in bed  Head: Normocephalic, atraumatic. Moist oral mucosal membranes  Eyes: Anicteric, PERRL  Neck: Supple, trachea midline  Lungs:  Bilateral crackles  Heart: Regular rate and rhythm  Abdomen:  Soft, nontender, ++abdominal wall edema  Extremities: 1+ peripheral edema.  Neurologic: Nonfocal, moving all four extremities  Skin: No lesions       Basic Metabolic Panel: Recent Labs  Lab 03/03/17 1114 03/04/17 0603 03/05/17 0349  NA 135 134* 133*  K 4.1 4.3 4.8  CL 98* 98* 97*  CO2 31 30 26   GLUCOSE 168* 176* 137*  BUN 28* 31* 34*  CREATININE 2.14* 2.29* 2.52*  CALCIUM 7.7* 7.6* 8.0*    Liver Function Tests: Recent Labs  Lab 03/03/17 1114  AST 26  ALT 16  ALKPHOS 297*  BILITOT 0.6  PROT 6.3*  ALBUMIN 2.3*   No results for input(s): LIPASE, AMYLASE in the last 168 hours. No results for input(s): AMMONIA in the last 168 hours.  CBC: Recent Labs  Lab 03/03/17 1114  WBC 7.4  NEUTROABS 5.8  HGB 9.5*  HCT 30.3*  MCV 84.8  PLT 380    Cardiac Enzymes: Recent Labs  Lab 03/03/17 1114   TROPONINI <0.03    BNP: Invalid input(s): POCBNP  CBG: Recent Labs  Lab 03/04/17 0829 03/04/17 1215 03/04/17 1637 03/04/17 2056 03/05/17 0738  GLUCAP 160* 166* 167* 181* 140*    Microbiology: Results for orders placed or performed during the hospital encounter of 03/03/17  MRSA PCR Screening     Status: Abnormal   Collection Time: 03/04/17  9:24 AM  Result Value Ref Range Status   MRSA by PCR POSITIVE (A) NEGATIVE Final    Comment:        The GeneXpert MRSA Assay (FDA approved for NASAL specimens only), is one component of a comprehensive MRSA colonization surveillance program. It is not intended to diagnose MRSA infection nor to guide or monitor treatment for MRSA infections. CRITICAL RESULT CALLED TO, READ BACK BY AND VERIFIED WITH: JESSICA CHRISTMAS 03/04/17 1141 KLW Performed at [facility], [address] [place],  [address]     Coagulation Studies: No results for input(s): LABPROT, INR in the last 72 hours.  Urinalysis: No results for input(s): COLORURINE, LABSPEC, PHURINE, GLUCOSEU, HGBUR, BILIRUBINUR, KETONESUR, PROTEINUR, UROBILINOGEN, NITRITE, LEUKOCYTESUR in the last 72 hours.  Invalid input(s): APPERANCEUR    Imaging: Dg Abd 2 Views  Result Date: 03/03/2017 CLINICAL DATA:  Shortness of breath and weight gain. EXAM: ABDOMEN - 2 VIEW COMPARISON:  PA and lateral chest this same day. FINDINGS: Right pleural effusion and basilar airspace disease are seen. No free intraperitoneal air is identified. The bowel gas pattern is nonobstructive. No abnormal abdominal calcification or focal bony abnormality. IMPRESSION: Negative abdomen. Right pleural effusion and basilar airspace disease. Electronically Signed   By: Inge Rise M.D.   On: 03/03/2017 15:02     Medications:   . albumin human Stopped (03/05/17 0737)  . furosemide     . aspirin EC  81 mg Oral Daily  . atorvastatin  80 mg Oral q1800  . Chlorhexidine Gluconate Cloth  6  each Topical Q0600  . cholecalciferol  4,000 Units Oral Weekly  . citalopram  40 mg Oral Daily  . cloNIDine  0.2 mg Oral BID  . heparin  5,000 Units Subcutaneous Q8H  . hydrALAZINE  100 mg Oral Q8H  . insulin aspart  0-9 Units Subcutaneous TID WC  . metoprolol tartrate  100 mg Oral BID  . mupirocin ointment  1 application Nasal BID  . sodium chloride flush  3 mL Intravenous Q12H   acetaminophen **OR** acetaminophen, albuterol, bisacodyl, diphenhydrAMINE, guaiFENesin, ondansetron **OR** ondansetron (ZOFRAN) IV, polyethylene glycol, polyethylene glycol, traMADol  Assessment/ Plan:  [name] is a 47 y.o. black female with morbid obesity, insulin-dependent diabetes mellitus type II, obesity, anemia, diastolic congestive heart failure, COPD/asthma, hypertension,   1. Acute renal failure on chronic kidney disease stage III with nephrotic range proteinuria: baseline creatinine 2.23, GFR of 29 on 01/10/17 Chronic kidney disease with proteinuria secondary to diabetic nephropathy, hypertension Acute renal failure from acute cardiorenal syndrome  Low albumin - IV furosemide 120mg  q8 - Albumin IV 25g q8  2. Acute exacerbation of diastolic congestive heart failure with hypertension. Echocardiogram 11/12/16 - Appreciate cardiology input.   3. Diabetes mellitus type II with chronic kidney disease: insulin dependent - hemoglobin A1c 7.3%   LOS: [place], [place] 1/9/201912:31 PM

## 2017-03-05 NOTE — Progress Notes (Signed)
[facility] [place] NAME: Cynthis Purington    MR#:  [id number]  DATE OF BIRTH:  1970-10-29  SUBJECTIVE:  CHIEF COMPLAINT:   Chief Complaint  Patient presents with  . Shortness of Breath   Due to shortness of breath and leg swelling.  Constipation. REVIEW OF SYSTEMS:  Review of Systems  Constitutional: Positive for malaise/fatigue. Negative for chills and fever.  HENT: Negative for sore throat.   Eyes: Negative for blurred vision and double vision.  Respiratory: Positive for cough and shortness of breath. Negative for hemoptysis, wheezing and stridor.   Cardiovascular: Positive for leg swelling. Negative for chest pain, palpitations and orthopnea.  Gastrointestinal: Positive for constipation. Negative for abdominal pain, blood in stool, diarrhea, melena, nausea and vomiting.  Genitourinary: Negative for dysuria, flank pain and hematuria.  Musculoskeletal: Negative for back pain and joint pain.  Neurological: Negative for dizziness, sensory change, focal weakness, seizures, loss of consciousness, weakness and headaches.  Endo/Heme/Allergies: Negative for polydipsia.  Psychiatric/Behavioral: Negative for depression. The patient is not nervous/anxious.     DRUG ALLERGIES:   Allergies  Allergen Reactions  . Dust Mite Extract Shortness Of Breath and Itching  . Mold Extract [Trichophyton] Shortness Of Breath and Itching  . Feraheme [Ferumoxytol] Itching  . Pollen Extract   . Sulfa Antibiotics Itching   VITALS:  Blood pressure 135/69, pulse 70, temperature 98.7 F (37.1 C), temperature source Oral, resp. rate 18, height 5\' 4"  (1.626 m), weight (!) 402 lb 11.2 oz (182.7 kg), last menstrual period 10/14/2012, SpO2 98 %. PHYSICAL EXAMINATION:  Physical Exam  Constitutional: She is oriented to person, place, and time and well-developed, well-nourished, and in no distress.  Morbid obesity.  HENT:  Head: Normocephalic.  Mouth/Throat:  Oropharynx is clear and moist.  Eyes: Conjunctivae and EOM are normal. Pupils are equal, round, and reactive to light. No scleral icterus.  Neck: Normal range of motion. Neck supple. No JVD present. No tracheal deviation present.  Cardiovascular: Normal rate, regular rhythm and normal heart sounds. Exam reveals no gallop.  No murmur heard. Pulmonary/Chest: Effort normal. No respiratory distress. She has no wheezes. She has rales.  Diminished lung sounds  Abdominal: Soft. Bowel sounds are normal. She exhibits distension. There is no tenderness. There is no rebound.  Musculoskeletal: She exhibits edema. She exhibits no tenderness.  Bilateral leg chronic skin changes with edema.  Neurological: She is alert and oriented to person, place, and time. No cranial nerve deficit.  Skin: No rash noted. No erythema.  Psychiatric: Affect normal.   LABORATORY PANEL:  Female CBC Recent Labs  Lab 03/03/17 1114  WBC 7.4  HGB 9.5*  HCT 30.3*  PLT 380   ------------------------------------------------------------------------------------------------------------------ Chemistries  Recent Labs  Lab 03/03/17 1114  03/05/17 0349  NA 135   < > 133*  K 4.1   < > 4.8  CL 98*   < > 97*  CO2 31   < > 26  GLUCOSE 168*   < > 137*  BUN 28*   < > 34*  CREATININE 2.14*   < > 2.52*  CALCIUM 7.7*   < > 8.0*  AST 26  --   --   ALT 16  --   --   ALKPHOS 297*  --   --   BILITOT 0.6  --   --    < > = values in this interval not displayed.   RADIOLOGY:  No results found. ASSESSMENT AND PLAN:  Thuy Atilano  is a 47 y.o. female with a known history of morbid obesity, diastolic congestive heart failure, chronic, hypertension, CKD stage III, type 2 diabetes comes to the emergency room with increasing shortness of breath and weight gain which patient tells me she is not fitting into her clothes and shoes.  Patient does not remember her dry weight.  1.  Acute on chronic diastolic congestive heart failure, LV EF:  55% Increased Lasix 120 Q8H per Dr. Saunders Revel.  2.  DM 2 -Sliding scale insulin  3.  Morbid obesity with possible underlying sleep apnea  4.  ARF on CKD stage III Creatinine slightly increased to 2.5.  hold losartan-Resume losartan once creatinine is stable Albumin Q8H per Dr. Juleen China.  5.  Anemia of chronic disease.  Stable.  Hyponatremia.  Follow-up sodium level while on Lasix. Urinary retention.  Foley catheter was placed.  Generalized weakness.  PT evaluation. All the records are reviewed and case discussed with Care Management/Social Worker. Management plans discussed with the patient, her husband and they are in agreement.  CODE STATUS: Full Code  TOTAL TIME TAKING CARE OF THI S PATIENT: 36 minutes.   More than 50% of the time was spent in counseling/coordination of care: YES  POSSIBLE D/C IN 2 DAYS, DEPENDING ON CLINICAL CONDITION.   Demetrios Loll M.D on 03/05/2017 at 1:57 PM  Between 7am to 6pm - Pager - [phone]  After 6pm go to [email or web address] - [facility]

## 2017-03-05 NOTE — Care Management (Addendum)
Barrier- increase IV lasix 120mg  q 8h.  Foley cath placed to accurately measure output. Discussed mi=obilization of patient during progression.  Spoke with patient and her husband regarding medication administration concerns in the home.  Discussed that in order for home health to continue in the home, an established mechanism to monitor medication administration must be established.  The nurse must have acces to the medications every visit. A med planner would be of benefit.  Discussed the need to complete the [facility] which was given yesterday.  Patient nor her husband has started the application. Restated CM would fax application if would complete it.  Discussed the need to keep follow up appointments.  CM discussed with attending of the need to have discharge med scripts as early in the day as possible so can be filled at [facility].  Patient has already used one time MATCH.  Patient relayed that she is not able to stand due to weakness in her legs. Message to attending regarding consult for physical therapy

## 2017-03-05 NOTE — Evaluation (Signed)
Physical Therapy Evaluation Patient Details Name: [name] MRN: [id number] DOB: 02/23/1971 Today's Date: 03/05/2017   History of Present Illness  presented to ER secondary to worsening SOB, weight gain; admitted secondary to acute/chronic respiratory failure related to CHF exacebation.  Clinical Impression  Upon evaluation, patient alert and oriented; follows commands.  Demonstrates fair effort with therapeutic tasks.  Requires moderate encouragement and multiple, extended rest periods with all activities and transitional movements due to self-reported anxiety.  Requested increase in O2 to 5L for participation; cleared by RN. Bilat UE grossly 4/5; LES generally weak and deconditioned, at least 3-/5.  Maintains semi-windswept position towards R in all supine, sitting and standing postures due to body habitus and overall weight shift.  Currently requiring mod/max assist +2 for bed mobility; min/mod assist for sit/stand and very short-distance gait/stepping with bari RW. Maintains broad BOS with poor balance, poor functional endurance.  Refused attempts at OOB to chair, as she feels chair is too low to rise from. Unable to redirect. Sats maintained >94% throughout session on 4-5L supplemental O2 throuhgout session. Would benefit from skilled PT to address above deficits and promote optimal return to PLOF; recommend transition to STR upon discharge from acute hospitalization.     Follow Up Recommendations SNF    Equipment Recommendations  (bari RW)    Recommendations for Other Services       Precautions / Restrictions Precautions Precautions: Fall Restrictions Weight Bearing Restrictions: No      Mobility  Bed Mobility Overal bed mobility: Needs Assistance Bed Mobility: Supine to Sit;Sit to Supine     Supine to sit: Mod assist;+2 for physical assistance Sit to supine: Max assist;+2 for physical assistance      Transfers Overall transfer level: Needs assistance Equipment  used: Rolling walker (2 wheeled) Transfers: Sit to/from Stand Sit to Stand: Min assist;Mod assist;+2 physical assistance         General transfer comment: broad BOS, heavy use of momentum; requires use of bilat UEs  Ambulation/Gait Ambulation/Gait assistance: Min assist;Mod assist;+2 physical assistance Ambulation Distance (Feet): 2 Feet Assistive device: Rolling walker (2 wheeled)       General Gait Details: 1-2 steps forward backward and laterally; very broad bOS with limited step height/foot clearnace. Heavy WBing bilat UEs, difficulty advancing L LE ("this is my bad leg").  Unable to tolerate additional gait distance due to fatige, SOB, anxiety.  Refused OOB to chair.  Stairs            Wheelchair Mobility    Modified Rankin (Stroke Patients Only)       Balance Overall balance assessment: Needs assistance Sitting-balance support: No upper extremity supported;Feet supported Sitting balance-Leahy Scale: Fair Sitting balance - Comments: body habitus, weight shift to R   Standing balance support: Bilateral upper extremity supported Standing balance-Leahy Scale: Fair                               Pertinent Vitals/Pain Pain Assessment: No/denies pain    Home Living Family/patient expects to be discharged to:: Private residence Living Arrangements: Spouse/significant other Available Help at Discharge: Family;Personal care attendant Type of Home: House Home Access: Stairs to enter Entrance Stairs-Rails: None Entrance Stairs-Number of Steps: 4 Home Layout: One level Home Equipment: Walker - 4 wheels      Prior Function Level of Independence: Needs assistance      ADL's / Homemaking Assistance Needed: Pt. requires assistance for all ADLs, IADLs.  Pt. has a personal care aide 3 days a week, approximately one hour. Pt. husband assists with cooking, cleaning, and driving.  Comments: Mod indep with 4WRW for limited household distances; does endorse  single fall in previous six months     Hand Dominance        Extremity/Trunk Assessment   Upper Extremity Assessment Upper Extremity Assessment: Overall WFL for tasks assessed    Lower Extremity Assessment Lower Extremity Assessment: Generalized weakness(grossly 3-/5 throughout)       Communication   Communication: No difficulties  Cognition Arousal/Alertness: Awake/alert Behavior During Therapy: Flat affect Overall Cognitive Status: Within Functional Limits for tasks assessed                                        General Comments      Exercises Other Exercises Other Exercises: Sit/stand x3 with bari RW, min/mod assist +2-requires UE support, broad BOS; fatigues quickly   Assessment/Plan    PT Assessment Patient needs continued PT services  PT Problem List Decreased strength;Decreased range of motion;Decreased activity tolerance;Decreased balance;Decreased mobility;Decreased knowledge of use of DME;Decreased safety awareness;Decreased knowledge of precautions;Cardiopulmonary status limiting activity;Obesity       PT Treatment Interventions DME instruction;Gait training;Stair training;Therapeutic activities;Functional mobility training;Therapeutic exercise;Balance training;Patient/family education    PT Goals (Current goals can be found in the Care Plan section)  Acute Rehab PT Goals Patient Stated Goal: to try to sit up PT Goal Formulation: With patient Time For Goal Achievement: 03/19/17 Potential to Achieve Goals: Fair    Frequency Min 2X/week   Barriers to discharge Decreased caregiver support      Co-evaluation               AM-PAC PT "6 Clicks" Daily Activity  Outcome Measure Difficulty turning over in bed (including adjusting bedclothes, sheets and blankets)?: Unable Difficulty moving from lying on back to sitting on the side of the bed? : Unable Difficulty sitting down on and standing up from a chair with arms (e.g.,  wheelchair, bedside commode, etc,.)?: Unable Help needed moving to and from a bed to chair (including a wheelchair)?: Total Help needed walking in hospital room?: Total Help needed climbing 3-5 steps with a railing? : Total 6 Click Score: 6    End of Session Equipment Utilized During Treatment: Gait belt Activity Tolerance: Patient limited by fatigue(limited by self-reported anxiety) Patient left: in bed;with bed alarm set;with call bell/phone within reach Nurse Communication: Mobility status PT Visit Diagnosis: Difficulty in walking, not elsewhere classified (R26.2);Muscle weakness (generalized) (M62.81)    Time: 1530-1610 PT Time Calculation (min) (ACUTE ONLY): 40 min   Charges:   PT Evaluation $PT Eval Moderate Complexity: 1 Mod PT Treatments $Therapeutic Activity: 23-37 mins   PT G Codes:        Makenly Larabee H. Owens Shark, PT, DPT, NCS 03/05/17, 10:37 PM [phone]

## 2017-03-05 NOTE — Progress Notes (Signed)
Progress Note  Patient Name: [name] Date of Encounter: 03/05/2017  Primary Cardiologist: Rockey Situ  Subjective   Was given metolazone 5 mg with IV Lasix 1/8 with minimal documented UOP over the past 24 hours of 1.1 L. Has Foley in. Weight down 2 pounds by bed scale (uncertain how accurate this is). Renal function slightly worse 2.29-->2.52 (baseline ~ 2.2). Renal following. Remains significantly volume overloaded.   Inpatient Medications    Scheduled Meds: . aspirin EC  81 mg Oral Daily  . atorvastatin  80 mg Oral q1800  . Chlorhexidine Gluconate Cloth  6 each Topical Q0600  . cholecalciferol  4,000 Units Oral Weekly  . citalopram  40 mg Oral Daily  . cloNIDine  0.2 mg Oral BID  . heparin  5,000 Units Subcutaneous Q8H  . hydrALAZINE  100 mg Oral Q8H  . insulin aspart  0-9 Units Subcutaneous TID WC  . metoprolol tartrate  100 mg Oral BID  . mupirocin ointment  1 application Nasal BID  . sodium chloride flush  3 mL Intravenous Q12H   Continuous Infusions: . albumin human Stopped (03/05/17 0737)  . furosemide     PRN Meds: acetaminophen **OR** acetaminophen, albuterol, bisacodyl, diphenhydrAMINE, guaiFENesin, ondansetron **OR** ondansetron (ZOFRAN) IV, polyethylene glycol, polyethylene glycol, traMADol   Vital Signs    Vitals:   03/04/17 1927 03/04/17 1933 03/05/17 0518 03/05/17 0741  BP: (!) 150/73  133/75 135/69  Pulse: 77 77 72 70  Resp:   18 18  Temp:  98.7 F (37.1 C) 98.1 F (36.7 C) 98.7 F (37.1 C)  TempSrc:  Oral Oral Oral  SpO2:  94% 94% 98%  Weight:   (!) 402 lb 11.2 oz (182.7 kg)   Height:        Intake/Output Summary (Last 24 hours) at 03/05/2017 1024 Last data filed at 03/05/2017 2952 Gross per 24 hour  Intake 300 ml  Output 1470 ml  Net -1170 ml   Filed Weights   03/03/17 1534 03/04/17 0509 03/05/17 0518  Weight: (!) 403 lb 8 oz (183 kg) (!) 404 lb 4.8 oz (183.4 kg) (!) 402 lb 11.2 oz (182.7 kg)    Telemetry    NSR - Personally  Reviewed  ECG    n/a - Personally Reviewed  Physical Exam   GEN: No acute distress.   Neck: JVD difficult to assess 2/2 body habitus. Cardiac: RRR, no murmurs, rubs, or gallops.  Respiratory: Diminished breath sounds bilaterally.  GI: Obese, soft, nontender, distended.   MS: 2+ chronic woody edema to the thighs; sacral edema; No deformity. Neuro:  Alert and oriented x 3; Nonfocal.  Psych: Normal affect.  Labs    Chemistry Recent Labs  Lab 03/03/17 1114 03/04/17 0603 03/05/17 0349  NA 135 134* 133*  K 4.1 4.3 4.8  CL 98* 98* 97*  CO2 31 30 26   GLUCOSE 168* 176* 137*  BUN 28* 31* 34*  CREATININE 2.14* 2.29* 2.52*  CALCIUM 7.7* 7.6* 8.0*  PROT 6.3*  --   --   ALBUMIN 2.3*  --   --   AST 26  --   --   ALT 16  --   --   ALKPHOS 297*  --   --   BILITOT 0.6  --   --   GFRNONAA 27* 25* 22*  GFRAA 31* 29* 25*  ANIONGAP 6 6 10      Hematology Recent Labs  Lab 03/03/17 1114  WBC 7.4  RBC 3.58*  HGB  9.5*  HCT 30.3*  MCV 84.8  MCH 26.6  MCHC 31.4*  RDW 16.1*  PLT 380    Cardiac Enzymes Recent Labs  Lab 03/03/17 1114  TROPONINI <0.03   No results for input(s): TROPIPOC in the last 168 hours.   BNP Recent Labs  Lab 03/03/17 1114  BNP 617.0*     DDimer No results for input(s): DDIMER in the last 168 hours.   Radiology    Dg Chest 2 View  Result Date: 03/03/2017 IMPRESSION: Findings consistent with congestive heart failure. Improvement in pulmonary edema. Bilateral pleural effusions and bibasilar atelectasis, with some progression of left lower lobe airspace disease and effusions since the prior study. Electronically Signed   By: Franchot Gallo M.D.   On: 03/03/2017 10:55   Dg Abd 2 Views  Result Date: 03/03/2017 IMPRESSION: Negative abdomen. Right pleural effusion and basilar airspace disease. Electronically Signed   By: Inge Rise M.D.   On: 03/03/2017 15:02    Cardiac Studies   TTE 10/2016: Study Conclusions  - Left ventricle: The cavity  size was mildly dilated. Systolic   function was normal. The estimated ejection fraction was 55%.   Wall motion was normal; there were no regional wall motion   abnormalities. Doppler parameters are consistent with abnormal   left ventricular relaxation (grade 1 diastolic dysfunction). - Mitral valve: There was mild regurgitation. - Atrial septum: No defect or patent foramen ovale was identified.  Impressions:  - Normal LVEF with mild to moderate MR and mild diastolic   dysfunction.  Patient Profile     47 y.o. female with history of chronic diastolic heart failure, chronic kidney disease stage III, COPD/asthma, IDDM, anemia of chronic disease, and morbid obesity possible OHS who was admitted with acute on chronic diastolic CHF.   Assessment & Plan    1. Acute on chronic diastolic CHF: -She remains significantly volume overloaded, at least 50 pounds -Increase IV Lasix to 120 mg bid -May need metolazone this PM or AM of 1/10 -Ultimately, may require Lasix infusion if renal function continues to decline  2. Acute on CKD stage III: -Worsening BUN/SCr, though improving CO2 -Increase IV Lasix to 120 mg bid today -May need another dose of metolazone as above this PM or AM of 1/10 -Per Renal  3. HTN: -Reasonably controlled -Lasix increased as above -Continue current medications  4. Anemia of chronic disease: -Per IM  5. Morbid obesity/possible OSA/OHS: -Weight loss advised -Likely contributing to her presentation  For questions or updates, please contact Index Please consult [email or web address] for contact info under Cardiology/STEMI.    Signed, Christell Faith, PA-C [place] Pager: [phone] 03/05/2017, 10:24 AM

## 2017-03-06 ENCOUNTER — Telehealth: Payer: Self-pay | Admitting: Cardiovascular Disease

## 2017-03-06 LAB — GLUCOSE, CAPILLARY
GLUCOSE-CAPILLARY: 139 mg/dL — AB (ref 65–99)
GLUCOSE-CAPILLARY: 123 mg/dL — AB (ref 65–99)
GLUCOSE-CAPILLARY: 149 mg/dL — AB (ref 65–99)
GLUCOSE-CAPILLARY: 128 mg/dL — AB (ref 65–99)

## 2017-03-06 LAB — MAGNESIUM: MAGNESIUM: 1.5 mg/dL — AB (ref 1.7–2.4)

## 2017-03-06 LAB — BASIC METABOLIC PANEL
Anion gap: 11 (ref 5–15)
BUN: 37 mg/dL — AB (ref 6–20)
CHLORIDE: 96 mmol/L — AB (ref 101–111)
CO2: 29 mmol/L (ref 22–32)
CREATININE: 2.56 mg/dL — AB (ref 0.44–1.00)
Calcium: 8.2 mg/dL — ABNORMAL LOW (ref 8.9–10.3)
GFR calc Af Amer: 25 mL/min — ABNORMAL LOW (ref >60)
GFR, EST NON AFRICAN AMERICAN: 21 mL/min — AB (ref >60)
Glucose, Bld: 143 mg/dL — ABNORMAL HIGH (ref 65–99)
Potassium: 4.4 mmol/L (ref 3.5–5.1)
SODIUM: 136 mmol/L (ref 135–145)

## 2017-03-06 MED ORDER — METOLAZONE 2.5 MG PO TABS
2.5000 mg | ORAL_TABLET | Freq: Once | ORAL | Status: AC
  Administered 2017-03-06: 2.5 mg via ORAL
  Filled 2017-03-06: qty 1

## 2017-03-06 MED ORDER — MAGNESIUM SULFATE 2 GM/50ML IV SOLN
2.0000 g | Freq: Once | INTRAVENOUS | Status: AC
  Administered 2017-03-06: 2 g via INTRAVENOUS
  Filled 2017-03-06: qty 50

## 2017-03-06 MED ORDER — FUROSEMIDE 10 MG/ML IJ SOLN
10.0000 mg/h | INTRAVENOUS | Status: DC
  Administered 2017-03-06 – 2017-03-07 (×2): 10 mg/h via INTRAVENOUS
  Filled 2017-03-06 (×2): qty 25

## 2017-03-06 NOTE — Plan of Care (Signed)
Continues to diurese with weights trending down.  Max assist X 2 to obtain standing weight.

## 2017-03-06 NOTE — Progress Notes (Signed)
Progress Note  Patient Name: [name] Date of Encounter: 03/06/2017  Primary Cardiologist: Rockey Situ  Subjective   She continues to be on high-dose furosemide with slow diuresis.  She is only -1 L for the admission but not sure if this is accurate given that her weight did go down 4 pounds. She continues to have significant generalized edema.  Inpatient Medications    Scheduled Meds: . aspirin EC  81 mg Oral Daily  . atorvastatin  80 mg Oral q1800  . Chlorhexidine Gluconate Cloth  6 each Topical Q0600  . cholecalciferol  4,000 Units Oral Weekly  . citalopram  40 mg Oral Daily  . cloNIDine  0.2 mg Oral BID  . docusate sodium  200 mg Oral BID  . heparin  5,000 Units Subcutaneous Q8H  . hydrALAZINE  100 mg Oral Q8H  . insulin aspart  0-9 Units Subcutaneous TID WC  . metolazone  2.5 mg Oral Once  . metoprolol tartrate  100 mg Oral BID  . mupirocin ointment  1 application Nasal BID  . sodium chloride flush  3 mL Intravenous Q12H   Continuous Infusions: . albumin human 0 g (03/05/17 1700)  . furosemide (LASIX) infusion    . magnesium sulfate 1 - 4 g bolus IVPB     PRN Meds: acetaminophen **OR** acetaminophen, albuterol, bisacodyl, diphenhydrAMINE, guaiFENesin, ondansetron **OR** ondansetron (ZOFRAN) IV, polyethylene glycol, polyethylene glycol, traMADol   Vital Signs    Vitals:   03/05/17 1622 03/05/17 1934 03/06/17 0537 03/06/17 0806  BP:  (!) 150/79 (!) 149/82 132/71  Pulse:  71 66 68  Resp:  18 18   Temp:  98.1 F (36.7 C) 97.8 F (36.6 C) 97.7 F (36.5 C)  TempSrc:  Oral Oral Oral  SpO2: 99% 100% 100% 100%  Weight:   (!) 399 lb 9.6 oz (181.3 kg)   Height:        Intake/Output Summary (Last 24 hours) at 03/06/2017 0926 Last data filed at 03/06/2017 7672 Gross per 24 hour  Intake 386 ml  Output 450 ml  Net -64 ml   Filed Weights   03/04/17 0509 03/05/17 0518 03/06/17 0537  Weight: (!) 404 lb 4.8 oz (183.4 kg) (!) 402 lb 11.2 oz (182.7 kg) (!) 399 lb  9.6 oz (181.3 kg)    Telemetry    NSR - Personally Reviewed  ECG    n/a - Personally Reviewed  Physical Exam   GEN: No acute distress.   Neck: JVD difficult to assess 2/2 body habitus. Cardiac: RRR, no murmurs, rubs, or gallops.  Respiratory: Diminished breath sounds bilaterally.  GI: Obese, soft, nontender, distended.   MS: 2+ chronic woody edema to the thighs; sacral edema; No deformity. Neuro:  Alert and oriented x 3; Nonfocal.  Psych: Normal affect.  Labs    Chemistry Recent Labs  Lab 03/03/17 1114 03/04/17 0603 03/05/17 0349 03/06/17 0440  NA 135 134* 133* 136  K 4.1 4.3 4.8 4.4  CL 98* 98* 97* 96*  CO2 31 30 26 29   GLUCOSE 168* 176* 137* 143*  BUN 28* 31* 34* 37*  CREATININE 2.14* 2.29* 2.52* 2.56*  CALCIUM 7.7* 7.6* 8.0* 8.2*  PROT 6.3*  --   --   --   ALBUMIN 2.3*  --   --   --   AST 26  --   --   --   ALT 16  --   --   --   ALKPHOS 297*  --   --   --  BILITOT 0.6  --   --   --   GFRNONAA 27* 25* 22* 21*  GFRAA 31* 29* 25* 25*  ANIONGAP 6 6 10 11      Hematology Recent Labs  Lab 03/03/17 1114  WBC 7.4  RBC 3.58*  HGB 9.5*  HCT 30.3*  MCV 84.8  MCH 26.6  MCHC 31.4*  RDW 16.1*  PLT 380    Cardiac Enzymes Recent Labs  Lab 03/03/17 1114  TROPONINI <0.03   No results for input(s): TROPIPOC in the last 168 hours.   BNP Recent Labs  Lab 03/03/17 1114  BNP 617.0*     DDimer No results for input(s): DDIMER in the last 168 hours.   Radiology    Dg Chest 2 View  Result Date: 03/03/2017 IMPRESSION: Findings consistent with congestive heart failure. Improvement in pulmonary edema. Bilateral pleural effusions and bibasilar atelectasis, with some progression of left lower lobe airspace disease and effusions since the prior study. Electronically Signed   By: Franchot Gallo M.D.   On: 03/03/2017 10:55   Dg Abd 2 Views  Result Date: 03/03/2017 IMPRESSION: Negative abdomen. Right pleural effusion and basilar airspace disease. Electronically  Signed   By: Inge Rise M.D.   On: 03/03/2017 15:02    Cardiac Studies   TTE 10/2016: Study Conclusions  - Left ventricle: The cavity size was mildly dilated. Systolic   function was normal. The estimated ejection fraction was 55%.   Wall motion was normal; there were no regional wall motion   abnormalities. Doppler parameters are consistent with abnormal   left ventricular relaxation (grade 1 diastolic dysfunction). - Mitral valve: There was mild regurgitation. - Atrial septum: No defect or patent foramen ovale was identified.  Impressions:  - Normal LVEF with mild to moderate MR and mild diastolic   dysfunction.  Patient Profile     47 y.o. female with history of chronic diastolic heart failure, chronic kidney disease stage III, COPD/asthma, IDDM, anemia of chronic disease, and morbid obesity possible OHS who was admitted with acute on chronic diastolic CHF.   Assessment & Plan    1. Acute on chronic diastolic CHF: -She remains significantly volume overloaded, at least 40-50 pounds -Diuresis has been difficult likely due to underlying chronic kidney disease and proteinuria. I elected to switch her to furosemide drip for more consistent diuresis.  I am also going to give her 1 dose of metolazone this can be used as needed.  2. Acute on CKD stage III: -Worsening BUN/SCr, though improving CO2 -Continue to monitor.  We are hoping that renal function might ultimately improve with diuresis.  3. HTN: -Reasonably controlled -Continue current medications  4. Anemia of chronic disease: -Per IM  5. Morbid obesity/possible OSA/OHS: -Weight loss advised -Likely contributing to her presentation  For questions or updates, please contact [place] Please consult [email or web address] for contact info under Cardiology/STEMI.    Signed, Kathlyn Sacramento, MD  [facility] 03/06/2017, 9:26 AM

## 2017-03-06 NOTE — Telephone Encounter (Signed)
Received forms from [employer] .  Sent to [employer] via interoffice mail.  Patient called.  LM with husband that patient packet with Galen Manila has been mailed and forms were sent to [employer].

## 2017-03-06 NOTE — Progress Notes (Signed)
[facility]  ROUNDING NOTE   Subjective:   Husband at bedside.   UOP 450   Furosemide 120mg   Not tolerating IV albumin  Objective:  Vital signs in last 24 hours:  Temp:  [97.7 F (36.5 C)-98.1 F (36.7 C)] 97.7 F (36.5 C) (01/10 1528) Pulse Rate:  [66-71] 66 (01/10 1528) Resp:  [18] 18 (01/10 0537) BP: (132-157)/(71-82) 157/82 (01/10 1528) SpO2:  [98 %-100 %] 98 % (01/10 1528) Weight:  [181.3 kg (399 lb 9.6 oz)] 181.3 kg (399 lb 9.6 oz) (01/10 0537)  Weight change: -1.406 kg (-1.6 oz) Filed Weights   03/04/17 0509 03/05/17 0518 03/06/17 0537  Weight: (!) 183.4 kg (404 lb 4.8 oz) (!) 182.7 kg (402 lb 11.2 oz) (!) 181.3 kg (399 lb 9.6 oz)    Intake/Output: I/O last 3 completed shifts: In: 586 [IV Piggyback:586] Out: 1600 [Urine:1600]   Intake/Output this shift:  Total I/O In: 155.8 [I.V.:5.8; IV Piggyback:150] Out: 1600 [Urine:1600]  Physical Exam: General: NAD, laying in bed  Head: Normocephalic, atraumatic. Moist oral mucosal membranes  Eyes: Anicteric, PERRL  Neck: Supple, trachea midline  Lungs:  Bilateral crackles  Heart: Regular rate and rhythm  Abdomen:  Soft, nontender, +abdominal wall edema  Extremities: 1+ peripheral edema.  Neurologic: Nonfocal, moving all four extremities  Skin: No lesions       Basic Metabolic Panel: Recent Labs  Lab 03/03/17 1114 03/04/17 0603 03/05/17 0349 03/06/17 0440  NA 135 134* 133* 136  K 4.1 4.3 4.8 4.4  CL 98* 98* 97* 96*  CO2 31 30 26 29   GLUCOSE 168* 176* 137* 143*  BUN 28* 31* 34* 37*  CREATININE 2.14* 2.29* 2.52* 2.56*  CALCIUM 7.7* 7.6* 8.0* 8.2*  MG  --   --   --  1.5*    Liver Function Tests: Recent Labs  Lab 03/03/17 1114  AST 26  ALT 16  ALKPHOS 297*  BILITOT 0.6  PROT 6.3*  ALBUMIN 2.3*   No results for input(s): LIPASE, AMYLASE in the last 168 hours. No results for input(s): AMMONIA in the last 168 hours.  CBC: Recent Labs  Lab 03/03/17 1114  WBC 7.4  NEUTROABS 5.8   HGB 9.5*  HCT 30.3*  MCV 84.8  PLT 380    Cardiac Enzymes: Recent Labs  Lab 03/03/17 1114  TROPONINI <0.03    BNP: Invalid input(s): POCBNP  CBG: Recent Labs  Lab 03/05/17 1227 03/05/17 1644 03/05/17 2116 03/06/17 0808 03/06/17 1222  GLUCAP 132* 118* 132* 139* 36*    Microbiology: Results for orders placed or performed during the hospital encounter of 03/03/17  MRSA PCR Screening     Status: Abnormal   Collection Time: 03/04/17  9:24 AM  Result Value Ref Range Status   MRSA by PCR POSITIVE (A) NEGATIVE Final    Comment:        The GeneXpert MRSA Assay (FDA approved for NASAL specimens only), is one component of a comprehensive MRSA colonization surveillance program. It is not intended to diagnose MRSA infection nor to guide or monitor treatment for MRSA infections. CRITICAL RESULT CALLED TO, READ BACK BY AND VERIFIED WITH: JESSICA CHRISTMAS 03/04/17 1141 KLW Performed at [facility], [address] [place], [place] [address]     Coagulation Studies: No results for input(s): LABPROT, INR in the last 72 hours.  Urinalysis: No results for input(s): COLORURINE, LABSPEC, PHURINE, GLUCOSEU, HGBUR, BILIRUBINUR, KETONESUR, PROTEINUR, UROBILINOGEN, NITRITE, LEUKOCYTESUR in the last 72 hours.  Invalid input(s): APPERANCEUR  Imaging: No results found.   Medications:   . albumin human 0 g (03/05/17 1700)  . furosemide (LASIX) infusion 10 mg/hr (03/06/17 1200)   . aspirin EC  81 mg Oral Daily  . atorvastatin  80 mg Oral q1800  . Chlorhexidine Gluconate Cloth  6 each Topical Q0600  . cholecalciferol  4,000 Units Oral Weekly  . citalopram  40 mg Oral Daily  . cloNIDine  0.2 mg Oral BID  . docusate sodium  200 mg Oral BID  . heparin  5,000 Units Subcutaneous Q8H  . hydrALAZINE  100 mg Oral Q8H  . insulin aspart  0-9 Units Subcutaneous TID WC  . metoprolol tartrate  100 mg Oral BID  . mupirocin ointment  1 application Nasal BID  . sodium  chloride flush  3 mL Intravenous Q12H   acetaminophen **OR** acetaminophen, albuterol, bisacodyl, diphenhydrAMINE, guaiFENesin, ondansetron **OR** ondansetron (ZOFRAN) IV, polyethylene glycol, polyethylene glycol, traMADol  Assessment/ Plan:  [name] is a 47 y.o. black female with morbid obesity, insulin-dependent diabetes mellitus type II, obesity, anemia, diastolic congestive heart failure, COPD/asthma, hypertension,   1. Acute renal failure on chronic kidney disease stage III with nephrotic range proteinuria: baseline creatinine 2.23, GFR of 29 on 01/10/17 Chronic kidney disease with proteinuria secondary to diabetic nephropathy, hypertension Acute renal failure from acute cardiorenal syndrome  Low albumin May need temporary hemodialysis for fluid removal. Discussed with patient.  - IV furosemide 120mg  q8 - hold IV albumin if this is causing a reaction.   2. Acute exacerbation of diastolic congestive heart failure with hypertension. Echocardiogram 11/12/16 - Appreciate cardiology input.   3. Diabetes mellitus type II with chronic kidney disease: insulin dependent - hemoglobin A1c 7.3%   LOS: 3 Minal Stuller 1/10/20193:46 PM

## 2017-03-06 NOTE — Progress Notes (Addendum)
Page doctor Salary about doctor kollouru's advise that pt needs to be on cardiac monitor. Doctor Salary order to put pt back on cardiac monitoring. Will continue to monitor.

## 2017-03-06 NOTE — Plan of Care (Signed)
  Progressing Clinical Measurements: Ability to maintain clinical measurements within normal limits will improve 03/06/2017 1403 - Progressing by Liliane Channel, RN Will remain free from infection 03/06/2017 1403 - Progressing by Liliane Channel, RN Respiratory complications will improve 03/06/2017 1403 - Progressing by Liliane Channel, RN Activity: Risk for activity intolerance will decrease 03/06/2017 1403 - Progressing by Liliane Channel, RN Safety: Ability to remain free from injury will improve 03/06/2017 1403 - Progressing by Liliane Channel, RN Skin Integrity: Risk for impaired skin integrity will decrease 03/06/2017 1403 - Progressing by Liliane Channel, RN

## 2017-03-06 NOTE — Progress Notes (Signed)
According to night nurse Jim Desanctis was discontinued yesterday and on day shift. The order was still active and I talked to [facility] and states he needs to be on tele. Page doctor Salary. Will continue to monitor.

## 2017-03-06 NOTE — Care Management (Signed)
Barrier: Lasix drip and and albumin

## 2017-03-06 NOTE — Progress Notes (Signed)
[place] at [place] NAME: [name]    MR#:  [id number]  DATE OF BIRTH:  12-Mar-1970  SUBJECTIVE:  CHIEF COMPLAINT:   Chief Complaint  Patient presents with  . Shortness of Breath    REVIEW OF SYSTEMS:  CONSTITUTIONAL: No fever, fatigue or weakness.  EYES: No blurred or double vision.  EARS, NOSE, AND THROAT: No tinnitus or ear pain.  RESPIRATORY: No cough, shortness of breath, wheezing or hemoptysis.  CARDIOVASCULAR: No chest pain, orthopnea, edema.  GASTROINTESTINAL: No nausea, vomiting, diarrhea or abdominal pain.  GENITOURINARY: No dysuria, hematuria.  ENDOCRINE: No polyuria, nocturia,  HEMATOLOGY: No anemia, easy bruising or bleeding SKIN: No rash or lesion. MUSCULOSKELETAL: No joint pain or arthritis.   NEUROLOGIC: No tingling, numbness, weakness.  PSYCHIATRY: No anxiety or depression.   ROS  DRUG ALLERGIES:   Allergies  Allergen Reactions  . Dust Mite Extract Shortness Of Breath and Itching  . Mold Extract [Trichophyton] Shortness Of Breath and Itching  . Feraheme [Ferumoxytol] Itching  . Pollen Extract   . Sulfa Antibiotics Itching    VITALS:  Blood pressure (!) 157/82, pulse 66, temperature 97.7 F (36.5 C), temperature source Oral, resp. rate 18, height 5\' 4"  (1.626 m), weight (!) 181.3 kg (399 lb 9.6 oz), last menstrual period 10/14/2012, SpO2 98 %.  PHYSICAL EXAMINATION:  GENERAL:  47 y.o.-year-old patient lying in the bed with no acute distress.  EYES: Pupils equal, round, reactive to light and accommodation. No scleral icterus. Extraocular muscles intact.  HEENT: Head atraumatic, normocephalic. Oropharynx and nasopharynx clear.  NECK:  Supple, no jugular venous distention. No thyroid enlargement, no tenderness.  LUNGS: Normal breath sounds bilaterally, no wheezing, rales,rhonchi or crepitation. No use of accessory muscles of respiration.  CARDIOVASCULAR: S1, S2 normal. No murmurs, rubs, or gallops.   ABDOMEN: Soft, nontender, nondistended. Bowel sounds present. No organomegaly or mass.  EXTREMITIES: No pedal edema, cyanosis, or clubbing.  NEUROLOGIC: Cranial nerves II through XII are intact. Muscle strength 5/5 in all extremities. Sensation intact. Gait not checked.  PSYCHIATRIC: The patient is alert and oriented x 3.  SKIN: No obvious rash, lesion, or ulcer.   Physical Exam LABORATORY PANEL:   CBC Recent Labs  Lab 03/03/17 1114  WBC 7.4  HGB 9.5*  HCT 30.3*  PLT 380   ------------------------------------------------------------------------------------------------------------------  Chemistries  Recent Labs  Lab 03/03/17 1114  03/06/17 0440  NA 135   < > 136  K 4.1   < > 4.4  CL 98*   < > 96*  CO2 31   < > 29  GLUCOSE 168*   < > 143*  BUN 28*   < > 37*  CREATININE 2.14*   < > 2.56*  CALCIUM 7.7*   < > 8.2*  MG  --   --  1.5*  AST 26  --   --   ALT 16  --   --   ALKPHOS 297*  --   --   BILITOT 0.6  --   --    < > = values in this interval not displayed.   ------------------------------------------------------------------------------------------------------------------  Cardiac Enzymes Recent Labs  Lab 03/03/17 1114  TROPONINI <0.03   ------------------------------------------------------------------------------------------------------------------  RADIOLOGY:  No results found.  ASSESSMENT AND PLAN:   [name] a46 y.o.femalewith a known history of morbid obesity, diastolic congestive heart failure, chronic, hypertension, CKD stage III, type 2 diabetes comes to the emergency room with increasing shortness of breath and weight gain which  patient tells me she is not fitting into her clothes and shoes. Patient does not remember her dry weight.  1 Acute on chronic diastolic congestive heart failure, LV EF: 55% Improving Most likely has element of cardiorenal syndrome Cardiology and nephrology input greatly appreciated, continue IV Lasix 120  Q8H per Dr. Saunders Revel, prn zaroxyln, strict I&O monitoring, daily weights, ?  Need for temporary hemodialysis per nephrology for fluid removal  2  DM 2, controlled Stable Continue Sliding scale insulin  3. Morbid obesity with possible underlying sleep apnea  4. ARF on CKD stage III BL Cr 2.2 11/18 Creatinine slightly increased to 2.5 Continue to hold losartan-Resume losartan once creatinine is stable ?  Need for temporary hemodialysis for fluid removal and discussion with nephrology/Dr Kolluru.  5.  Anemia of chronic disease Stable.  6. Hyponatremia Resolved Most likely secondary to diuretic  7.  Acute Urinary retention Resolved with Foley placement  Full code Condition stable   All the records are reviewed and case discussed with Care Management/Social Workerr. Management plans discussed with the patient, family and they are in agreement.  CODE STATUS: full   TOTAL TIME TAKING CARE OF THIS PATIENT: 40 minutes.     POSSIBLE D/C IN 2-3 DAYS, DEPENDING ON CLINICAL CONDITION.   Avel Peace Cletis Muma M.D on 03/06/2017   Between 7am to 6pm - Pager - [phone]  After 6pm go to [email or web address] - password [email or web address] [place]  Office  [phone]  CC: Primary care physician; [name], NP  Note: This dictation was prepared with Dragon dictation along with smaller phrase technology. Any transcriptional errors that result from this process are unintentional.

## 2017-03-07 LAB — BASIC METABOLIC PANEL
Anion gap: 12 (ref 5–15)
BUN: 41 mg/dL — ABNORMAL HIGH (ref 6–20)
CALCIUM: 8.5 mg/dL — AB (ref 8.9–10.3)
CO2: 29 mmol/L (ref 22–32)
CREATININE: 2.68 mg/dL — AB (ref 0.44–1.00)
Chloride: 95 mmol/L — ABNORMAL LOW (ref 101–111)
GFR calc Af Amer: 24 mL/min — ABNORMAL LOW (ref >60)
GFR, EST NON AFRICAN AMERICAN: 20 mL/min — AB (ref >60)
Glucose, Bld: 147 mg/dL — ABNORMAL HIGH (ref 65–99)
Potassium: 4.2 mmol/L (ref 3.5–5.1)
SODIUM: 136 mmol/L (ref 135–145)

## 2017-03-07 LAB — GLUCOSE, CAPILLARY
GLUCOSE-CAPILLARY: 150 mg/dL — AB (ref 65–99)
GLUCOSE-CAPILLARY: 158 mg/dL — AB (ref 65–99)
GLUCOSE-CAPILLARY: 141 mg/dL — AB (ref 65–99)
GLUCOSE-CAPILLARY: 129 mg/dL — AB (ref 65–99)
Glucose-Capillary: 155 mg/dL — ABNORMAL HIGH (ref 65–99)

## 2017-03-07 LAB — MAGNESIUM: Magnesium: 1.6 mg/dL — ABNORMAL LOW (ref 1.7–2.4)

## 2017-03-07 MED ORDER — GUAIFENESIN ER 600 MG PO TB12
600.0000 mg | ORAL_TABLET | Freq: Two times a day (BID) | ORAL | Status: DC
  Administered 2017-03-07 – 2017-03-10 (×5): 600 mg via ORAL
  Filled 2017-03-07 (×6): qty 1

## 2017-03-07 MED ORDER — METOLAZONE 5 MG PO TABS
5.0000 mg | ORAL_TABLET | Freq: Every day | ORAL | Status: DC
  Administered 2017-03-07 – 2017-03-08 (×2): 5 mg via ORAL
  Filled 2017-03-07 (×3): qty 1

## 2017-03-07 NOTE — Progress Notes (Signed)
PT Cancellation Note  Patient Details Name: BRITTANEY BEAULIEU MRN: [id number] DOB: [birth year]   Cancelled Treatment:    Reason Eval/Treat Not Completed: Patient declined, no reason specified(Treatment session attempted.  Patient declines participation at this time despite encouragement.  Reports she has been OOB to chair and up to [facility] this AM; just got settled back and would like to rest.  Will re-attempt at later time/date as appropriate.)   Lasaro Primm H. Owens Shark, PT, DPT, NCS 03/07/17, 11:34 AM [phone]

## 2017-03-07 NOTE — Progress Notes (Signed)
[facility]  ROUNDING NOTE   Subjective:   Changed to furosemide gtt yesterday. UOP 2.6 liters.   Given IV albumin - no more reaction  Objective:  Vital signs in last 24 hours:  Temp:  [97.5 F (36.4 C)-98.1 F (36.7 C)] 98.1 F (36.7 C) (01/11 0340) Pulse Rate:  [62-66] 65 (01/11 0709) Resp:  [18] 18 (01/11 0340) BP: (135-157)/(76-83) 149/77 (01/11 0709) SpO2:  [96 %-100 %] 100 % (01/11 0709)  Weight change:  Filed Weights   03/04/17 0509 03/05/17 0518 03/06/17 0537  Weight: (!) 183.4 kg (404 lb 4.8 oz) (!) 182.7 kg (402 lb 11.2 oz) (!) 181.3 kg (399 lb 9.6 oz)    Intake/Output: I/O last 3 completed shifts: In: 941 [P.O.:120; I.V.:67; IV Piggyback:474] Out: 3050 [Urine:3050]   Intake/Output this shift:  No intake/output data recorded.  Physical Exam: General: NAD, laying in bed  Head: Normocephalic, atraumatic. Moist oral mucosal membranes  Eyes: Anicteric, PERRL  Neck: Supple, trachea midline  Lungs:  Bilateral crackles  Heart: Regular rate and rhythm  Abdomen:  Soft, nontender, +abdominal wall edema  Extremities: 1+ peripheral edema.  Neurologic: Nonfocal, moving all four extremities  Skin: No lesions       Basic Metabolic Panel: Recent Labs  Lab 03/03/17 1114 03/04/17 0603 03/05/17 0349 03/06/17 0440 03/07/17 0450  NA 135 134* 133* 136 136  K 4.1 4.3 4.8 4.4 4.2  CL 98* 98* 97* 96* 95*  CO2 31 30 26 29 29   GLUCOSE 168* 176* 137* 143* 147*  BUN 28* 31* 34* 37* 41*  CREATININE 2.14* 2.29* 2.52* 2.56* 2.68*  CALCIUM 7.7* 7.6* 8.0* 8.2* 8.5*  MG  --   --   --  1.5*  --     Liver Function Tests: Recent Labs  Lab 03/03/17 1114  AST 26  ALT 16  ALKPHOS 297*  BILITOT 0.6  PROT 6.3*  ALBUMIN 2.3*   No results for input(s): LIPASE, AMYLASE in the last 168 hours. No results for input(s): AMMONIA in the last 168 hours.  CBC: Recent Labs  Lab 03/03/17 1114  WBC 7.4  NEUTROABS 5.8  HGB 9.5*  HCT 30.3*  MCV 84.8  PLT 380     Cardiac Enzymes: Recent Labs  Lab 03/03/17 1114  TROPONINI <0.03    BNP: Invalid input(s): POCBNP  CBG: Recent Labs  Lab 03/06/17 1222 03/06/17 1704 03/06/17 2038 03/07/17 0814 03/07/17 1112  GLUCAP 123* 149* 128* 155* 150*    Microbiology: Results for orders placed or performed during the hospital encounter of 03/03/17  MRSA PCR Screening     Status: Abnormal   Collection Time: 03/04/17  9:24 AM  Result Value Ref Range Status   MRSA by PCR POSITIVE (A) NEGATIVE Final    Comment:        The GeneXpert MRSA Assay (FDA approved for NASAL specimens only), is one component of a comprehensive MRSA colonization surveillance program. It is not intended to diagnose MRSA infection nor to guide or monitor treatment for MRSA infections. CRITICAL RESULT CALLED TO, READ BACK BY AND VERIFIED WITH: JESSICA CHRISTMAS 03/04/17 1141 KLW Performed at [facility], [address] [place], [place] [address]     Coagulation Studies: No results for input(s): LABPROT, INR in the last 72 hours.  Urinalysis: No results for input(s): COLORURINE, LABSPEC, PHURINE, GLUCOSEU, HGBUR, BILIRUBINUR, KETONESUR, PROTEINUR, UROBILINOGEN, NITRITE, LEUKOCYTESUR in the last 72 hours.  Invalid input(s): APPERANCEUR    Imaging: No results found.   Medications:   .  albumin human 25 g (03/07/17 0706)  . furosemide (LASIX) infusion 10 mg/hr (03/06/17 1749)   . aspirin EC  81 mg Oral Daily  . atorvastatin  80 mg Oral q1800  . Chlorhexidine Gluconate Cloth  6 each Topical Q0600  . cholecalciferol  4,000 Units Oral Weekly  . citalopram  40 mg Oral Daily  . cloNIDine  0.2 mg Oral BID  . docusate sodium  200 mg Oral BID  . guaiFENesin  600 mg Oral BID  . heparin  5,000 Units Subcutaneous Q8H  . hydrALAZINE  100 mg Oral Q8H  . insulin aspart  0-9 Units Subcutaneous TID WC  . metolazone  5 mg Oral Daily  . metoprolol tartrate  100 mg Oral BID  . mupirocin ointment  1 application  Nasal BID  . sodium chloride flush  3 mL Intravenous Q12H   acetaminophen **OR** acetaminophen, albuterol, bisacodyl, diphenhydrAMINE, ondansetron **OR** ondansetron (ZOFRAN) IV, polyethylene glycol, polyethylene glycol, traMADol  Assessment/ Plan:  [name] is a 47 y.o. black female with morbid obesity, insulin-dependent diabetes mellitus type II, obesity, anemia, diastolic congestive heart failure, COPD/asthma, hypertension,   1. Acute renal failure on chronic kidney disease stage III with nephrotic range proteinuria: baseline creatinine 2.23, GFR of 29 on 01/10/17 Chronic kidney disease with proteinuria secondary to diabetic nephropathy, hypertension Nephrotic syndrome with anasarca and hypoalbuminemia: 6.9grams of proteinuria on 09/12/2016 Acute renal failure from acute cardiorenal syndrome  - furosemide gtt - IV albumin  2. Acute exacerbation of diastolic congestive heart failure with hypertension. Echocardiogram 11/12/16 - Appreciate cardiology input.   3. Diabetes mellitus type II with chronic kidney disease: insulin dependent - hemoglobin A1c 7.3%   LOS: 4 [name] 1/11/20192:46 PM

## 2017-03-07 NOTE — Progress Notes (Signed)
Progress Note  Patient Name: [name] Date of Encounter: 03/07/2017  Primary Cardiologist: Rockey Situ  Subjective   Able to sleep more supine overnight. Coughing up thick green sputum. No chest pain. Slightly improved SOB. Notes bleeding into the left eye. No vision changes. Started on Lasix gtt 1/10 with a dose of metolazone. Documented UOP of 2.1 L for the past 24 hours and - 3.1 L for the admission. No weight this morning. Renal function continues to trend upwards with BUN/SCr 37/2.56-->41/2.68. CO2 stable.   Inpatient Medications    Scheduled Meds: . aspirin EC  81 mg Oral Daily  . atorvastatin  80 mg Oral q1800  . Chlorhexidine Gluconate Cloth  6 each Topical Q0600  . cholecalciferol  4,000 Units Oral Weekly  . citalopram  40 mg Oral Daily  . cloNIDine  0.2 mg Oral BID  . docusate sodium  200 mg Oral BID  . heparin  5,000 Units Subcutaneous Q8H  . hydrALAZINE  100 mg Oral Q8H  . insulin aspart  0-9 Units Subcutaneous TID WC  . metoprolol tartrate  100 mg Oral BID  . mupirocin ointment  1 application Nasal BID  . sodium chloride flush  3 mL Intravenous Q12H   Continuous Infusions: . albumin human 25 g (03/07/17 0706)  . furosemide (LASIX) infusion 10 mg/hr (03/06/17 1749)   PRN Meds: acetaminophen **OR** acetaminophen, albuterol, bisacodyl, diphenhydrAMINE, guaiFENesin, ondansetron **OR** ondansetron (ZOFRAN) IV, polyethylene glycol, polyethylene glycol, traMADol   Vital Signs    Vitals:   03/06/17 2259 03/06/17 2332 03/07/17 0340 03/07/17 0709  BP: 138/76  135/83 (!) 149/77  Pulse: 65  62 65  Resp:   18   Temp:   98.1 F (36.7 C)   TempSrc:   Oral   SpO2: 96% 96% 100% 100%  Weight:      Height:        Intake/Output Summary (Last 24 hours) at 03/07/2017 0840 Last data filed at 03/07/2017 0300 Gross per 24 hour  Intake 437 ml  Output 2600 ml  Net -2163 ml   Filed Weights   03/04/17 0509 03/05/17 0518 03/06/17 0537  Weight: (!) 404 lb 4.8 oz (183.4  kg) (!) 402 lb 11.2 oz (182.7 kg) (!) 399 lb 9.6 oz (181.3 kg)    Telemetry    NSR - Personally Reviewed  ECG    n/a - Personally Reviewed  Physical Exam   GEN: No acute distress. Bleeding noted of the left sclera.  Neck: JVD difficult to assess 2/2 body habitus. Cardiac: RRR, no murmurs, rubs, or gallops.  Respiratory: Diminsiehd breath sounds bilaterally.  GI: Soft, nontender, non-distended.   MS: 2+ chronic woody edema to the thighs; sacral edema; No deformity. Neuro:  Alert and oriented x 3; Nonfocal.  Psych: Normal affect.  Labs    Chemistry Recent Labs  Lab 03/03/17 1114  03/05/17 0349 03/06/17 0440 03/07/17 0450  NA 135   < > 133* 136 136  K 4.1   < > 4.8 4.4 4.2  CL 98*   < > 97* 96* 95*  CO2 31   < > 26 29 29   GLUCOSE 168*   < > 137* 143* 147*  BUN 28*   < > 34* 37* 41*  CREATININE 2.14*   < > 2.52* 2.56* 2.68*  CALCIUM 7.7*   < > 8.0* 8.2* 8.5*  PROT 6.3*  --   --   --   --   ALBUMIN 2.3*  --   --   --   --  AST 26  --   --   --   --   ALT 16  --   --   --   --   ALKPHOS 297*  --   --   --   --   BILITOT 0.6  --   --   --   --   GFRNONAA 27*   < > 22* 21* 20*  GFRAA 31*   < > 25* 25* 24*  ANIONGAP 6   < > 10 11 12    < > = values in this interval not displayed.     Hematology Recent Labs  Lab 03/03/17 1114  WBC 7.4  RBC 3.58*  HGB 9.5*  HCT 30.3*  MCV 84.8  MCH 26.6  MCHC 31.4*  RDW 16.1*  PLT 380    Cardiac Enzymes Recent Labs  Lab 03/03/17 1114  TROPONINI <0.03   No results for input(s): TROPIPOC in the last 168 hours.   BNP Recent Labs  Lab 03/03/17 1114  BNP 617.0*     DDimer No results for input(s): DDIMER in the last 168 hours.   Radiology    No results found.  Cardiac Studies   TTE 10/2016: Study Conclusions  - Left ventricle: The cavity size was mildly dilated. Systolic function was normal. The estimated ejection fraction was 55%. Wall motion was normal; there were no regional wall  motion abnormalities. Doppler parameters are consistent with abnormal left ventricular relaxation (grade 1 diastolic dysfunction). - Mitral valve: There was mild regurgitation. - Atrial septum: No defect or patent foramen ovale was identified.  Impressions:  - Normal LVEF with mild to moderate MR and mild diastolic dysfunction.  Patient Profile     47 y.o. female with history of chronic diastolic heart failure, chronic kidney disease stage III, COPD/asthma, IDDM, anemia of chronic disease, and morbid obesity possible OHSwho was admitted with acute on chronic diastolic CHF.   Assessment & Plan    1. Acute on chronic diastolic CHF: -She remains significantly volume overloaded -No weight this morning -Daily weights with strict Is and Os -Diuresis has been difficult due to her underlying CKD and proteinuria  -Changed to Lasix gtt 1/10 along with a dose of metolazone with good UOP as above -Continue Lasix gtt for today -Will need periodic metolazone pending renal function trend   2. Acute on CKD stage III: -Worsening BUN/SCR, though stable CO2 -Continue to monitor -Hold metolazone this AM -With diuresis, hopefully her renal function with improve -Renal on board  3. HTN: -Reasonably controlled -Continue current medications  4. Morbid obesity/OSA/OHS: -Likely contributing to the above -Weight loss advised  For questions or updates, please contact [place] Please consult [email or web address] for contact info under Cardiology/STEMI.    Signed, Christell Faith, PA-C [place] Pager: [phone] 03/07/2017, 8:40 AM

## 2017-03-07 NOTE — Progress Notes (Signed)
[place] [facility] [place] NAME: [name]    MR#:  [id number]  DATE OF BIRTH:  Dec 03, 1970  SUBJECTIVE:  CHIEF COMPLAINT:   Chief Complaint  Patient presents with  . Shortness of Breath   Patient still complains of cough, intermittent shortness of breath, as well as leg swelling REVIEW OF SYSTEMS:  CONSTITUTIONAL: No fever, fatigue or weakness.  EYES: No blurred or double vision.  EARS, NOSE, AND THROAT: No tinnitus or ear pain.  RESPIRATORY: No cough, shortness of breath, wheezing or hemoptysis.  CARDIOVASCULAR: No chest pain, orthopnea, edema.  GASTROINTESTINAL: No nausea, vomiting, diarrhea or abdominal pain.  GENITOURINARY: No dysuria, hematuria.  ENDOCRINE: No polyuria, nocturia,  HEMATOLOGY: No anemia, easy bruising or bleeding SKIN: No rash or lesion. MUSCULOSKELETAL: No joint pain or arthritis.   NEUROLOGIC: No tingling, numbness, weakness.  PSYCHIATRY: No anxiety or depression.   ROS  DRUG ALLERGIES:   Allergies  Allergen Reactions  . Dust Mite Extract Shortness Of Breath and Itching  . Mold Extract [Trichophyton] Shortness Of Breath and Itching  . Feraheme [Ferumoxytol] Itching  . Pollen Extract   . Sulfa Antibiotics Itching    VITALS:  Blood pressure (!) 149/77, pulse 65, temperature 98.1 F (36.7 C), temperature source Oral, resp. rate 18, height 5\' 4"  (1.626 m), weight (!) 181.3 kg (399 lb 9.6 oz), last menstrual period 10/14/2012, SpO2 100 %.  PHYSICAL EXAMINATION:  GENERAL:  47 y.o.-year-old patient lying in the bed with no acute distress.  Extreme morbid obesity EYES: Pupils equal, round, reactive to light and accommodation. No scleral icterus. Extraocular muscles intact.  HEENT: Head atraumatic, normocephalic. Oropharynx and nasopharynx clear.  NECK:  Supple, no jugular venous distention. No thyroid enlargement, no tenderness.  LUNGS: Diminished breath sounds bilaterally. No use of accessory muscles of  respiration.  CARDIOVASCULAR: S1, S2 normal. No murmurs, rubs, or gallops.  ABDOMEN: Soft, nontender, nondistended. Bowel sounds present. No organomegaly or mass.  EXTREMITIES: No pedal edema, cyanosis, or clubbing.  NEUROLOGIC: Cranial nerves II through XII are intact. MAES. Gait not checked.  PSYCHIATRIC: The patient is alert and oriented x 3.  SKIN: No obvious rash, lesion, or ulcer.   Physical Exam LABORATORY PANEL:   CBC Recent Labs  Lab 03/03/17 1114  WBC 7.4  HGB 9.5*  HCT 30.3*  PLT 380   ------------------------------------------------------------------------------------------------------------------  Chemistries  Recent Labs  Lab 03/03/17 1114  03/06/17 0440 03/07/17 0450  NA 135   < > 136 136  K 4.1   < > 4.4 4.2  CL 98*   < > 96* 95*  CO2 31   < > 29 29  GLUCOSE 168*   < > 143* 147*  BUN 28*   < > 37* 41*  CREATININE 2.14*   < > 2.56* 2.68*  CALCIUM 7.7*   < > 8.2* 8.5*  MG  --   --  1.5*  --   AST 26  --   --   --   ALT 16  --   --   --   ALKPHOS 297*  --   --   --   BILITOT 0.6  --   --   --    < > = values in this interval not displayed.   ------------------------------------------------------------------------------------------------------------------  Cardiac Enzymes Recent Labs  Lab 03/03/17 1114  TROPONINI <0.03   ------------------------------------------------------------------------------------------------------------------  RADIOLOGY:  No results found.  ASSESSMENT AND PLAN:   [name] a46 y.o.femalewith a known history  of morbid obesity, diastolic congestive heart failure, chronic, hypertension, CKD stage III, type 2 diabetes comes to the emergency room with increasing shortness of breath and weight gain which patient tells me she is not fitting into her clothes and shoes. Patient does not remember her dry weight.  1 Acute on chronic diastolic congestive heart failure, LV EF: 55% Resolving Most likely has element of  cardiorenal syndrome Cardiology and nephrology input greatly appreciated, continue Lasix drip per cardiology, start scheduled daily Zaroxolyn, congestive heart failure protocol with strict I&O monitoring and daily weights, check chest x-ray in the morning  2  DM 2, controlled Stable Continue Sliding scale insulin  3 chronic extreme morbid obesity Most likely secondary to excess calories Lifestyle modification recommended Patient states she was evaluated for sleep apnea in the last 1-3 years-tests were negative/did not require CPAP  4.ARF onCKD stage III BL Cr 2.2 11/18 Creatinine slightly increasedto 2.6 Continue to hold losartan-Resume losartan once creatinine is stable ? May require temporary hemodialysis in the future for fluid removal in d/w nephrology/Dr Kolluru, but not at this time  5 anemia of chronic disease Stable.  6 acute Urinary retention Resolved with Foley placement -will remove in the morning  7 acute on chronic hypoxic respiratory failure Secondary to congestive heart failure Continue breathing treatments as needed, at baseline 4 L via nasal cannula continuous, check a.m. blood gas and chest x-ray  Full code Condition stable Disposition to home in the morning barring any complications  All the records are reviewed and case discussed with Care Management/Social Workerr. Management plans discussed with the patient, family and they are in agreement.  CODE STATUS: full  TOTAL TIME TAKING CARE OF THIS PATIENT: 40 minutes.     POSSIBLE D/C IN 1-2 DAYS, DEPENDING ON CLINICAL CONDITION.   Avel Peace Larayne Baxley M.D on 03/07/2017   Between 7am to 6pm - Pager - [phone]  After 6pm go to [email or web address] - password [email or web address] [facility]  Office  [phone]  CC: Primary care physician; [name], NP  Note: This dictation was prepared with Dragon dictation along with smaller phrase technology. Any transcriptional errors that  result from this process are unintentional.

## 2017-03-08 ENCOUNTER — Inpatient Hospital Stay: Payer: Medicaid Other

## 2017-03-08 DIAGNOSIS — N184 Chronic kidney disease, stage 4 (severe): Secondary | ICD-10-CM

## 2017-03-08 DIAGNOSIS — J40 Bronchitis, not specified as acute or chronic: Secondary | ICD-10-CM

## 2017-03-08 DIAGNOSIS — J810 Acute pulmonary edema: Secondary | ICD-10-CM

## 2017-03-08 DIAGNOSIS — E662 Morbid (severe) obesity with alveolar hypoventilation: Secondary | ICD-10-CM

## 2017-03-08 LAB — BLOOD GAS, ARTERIAL
Acid-Base Excess: 6.5 mmol/L — ABNORMAL HIGH (ref 0.0–2.0)
Bicarbonate: 34.4 mmol/L — ABNORMAL HIGH (ref 20.0–28.0)
FIO2: 0.36
O2 Saturation: 96.3 %
PCO2 ART: 70 mmHg — AB (ref 32.0–48.0)
PH ART: 7.3 — AB (ref 7.350–7.450)
PO2 ART: 92 mmHg (ref 83.0–108.0)
Patient temperature: 37

## 2017-03-08 LAB — GLUCOSE, CAPILLARY
GLUCOSE-CAPILLARY: 127 mg/dL — AB (ref 65–99)
GLUCOSE-CAPILLARY: 137 mg/dL — AB (ref 65–99)
Glucose-Capillary: 138 mg/dL — ABNORMAL HIGH (ref 65–99)
Glucose-Capillary: 126 mg/dL — ABNORMAL HIGH (ref 65–99)

## 2017-03-08 LAB — BASIC METABOLIC PANEL
Anion gap: 10 (ref 5–15)
BUN: 41 mg/dL — AB (ref 6–20)
CHLORIDE: 94 mmol/L — AB (ref 101–111)
CO2: 30 mmol/L (ref 22–32)
Calcium: 8.3 mg/dL — ABNORMAL LOW (ref 8.9–10.3)
Creatinine, Ser: 2.72 mg/dL — ABNORMAL HIGH (ref 0.44–1.00)
GFR calc Af Amer: 23 mL/min — ABNORMAL LOW (ref >60)
GFR calc non Af Amer: 20 mL/min — ABNORMAL LOW (ref >60)
GLUCOSE: 133 mg/dL — AB (ref 65–99)
POTASSIUM: 3.8 mmol/L (ref 3.5–5.1)
Sodium: 134 mmol/L — ABNORMAL LOW (ref 135–145)

## 2017-03-08 LAB — BLOOD GAS, VENOUS
Acid-Base Excess: 9.4 mmol/L — ABNORMAL HIGH (ref 0.0–2.0)
BICARBONATE: 36.1 mmol/L — AB (ref 20.0–28.0)
Patient temperature: 37
pCO2, Ven: 61 mmHg — ABNORMAL HIGH (ref 44.0–60.0)
pH, Ven: 7.38 (ref 7.250–7.430)

## 2017-03-08 IMAGING — CR DG CHEST 2V
2 series · 3 of 3 positions shown · non-contrast
Comparison: [DATE]

CLINICAL DATA: Cough, shortness of Breath

EXAM:
CHEST  2 VIEW

[Series 2: chest lat · 0.14mm/px · 2 of 2 slices shown]
[im 1/2]
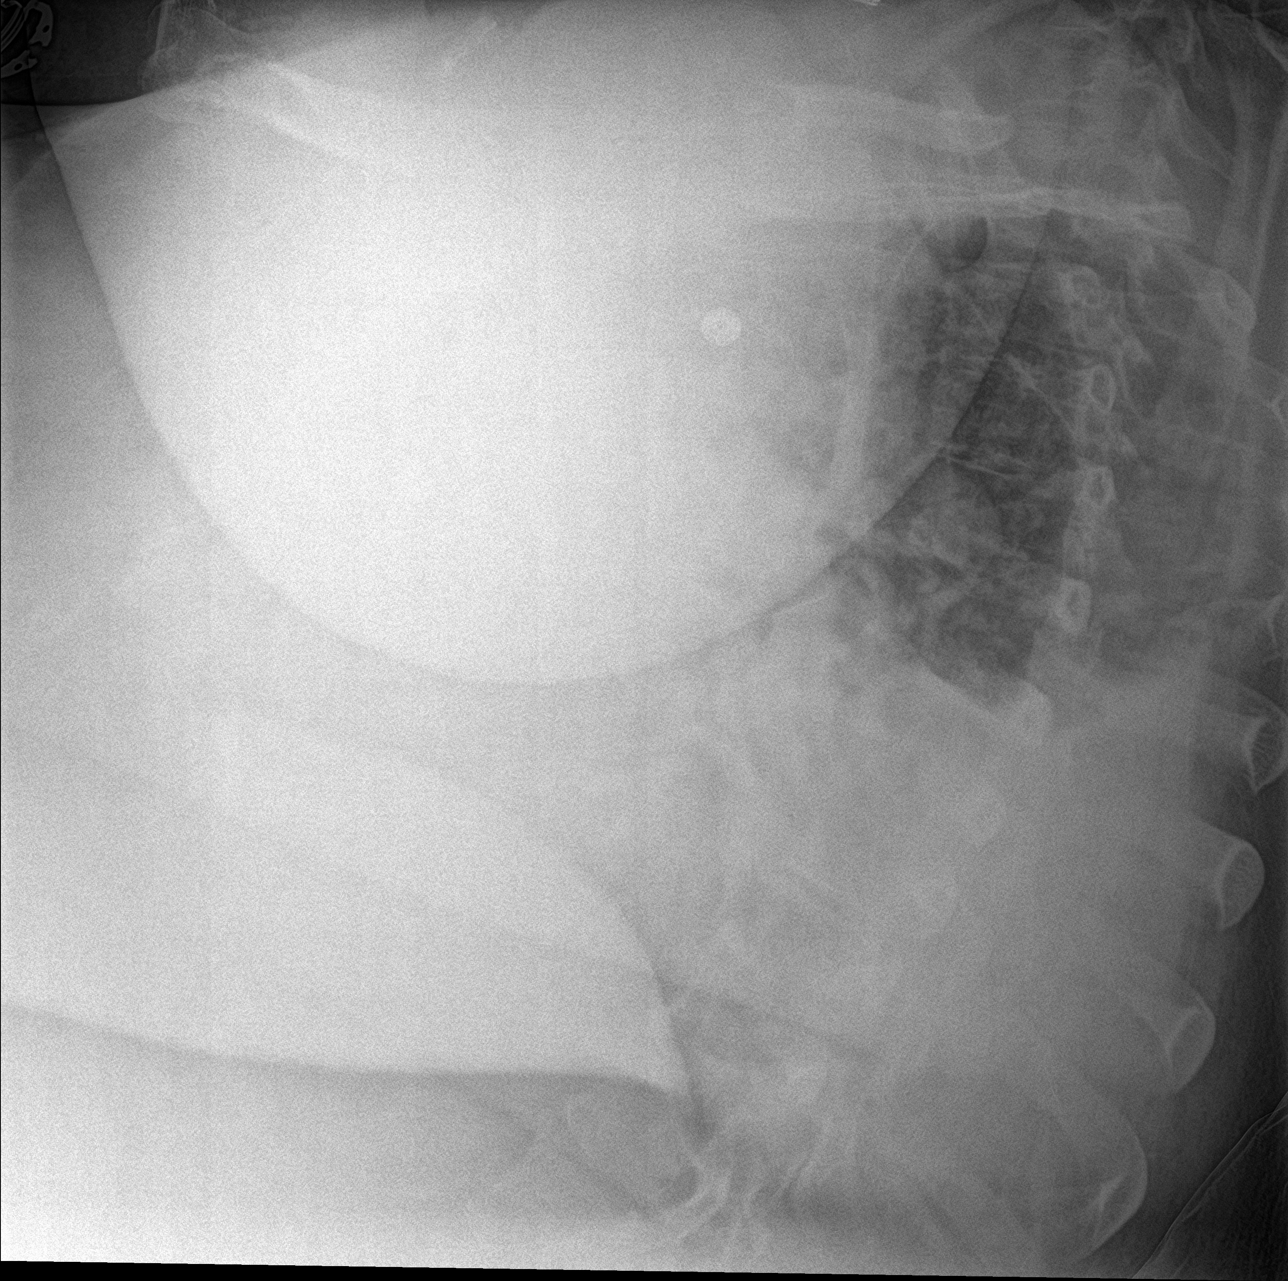
[im 2/2]
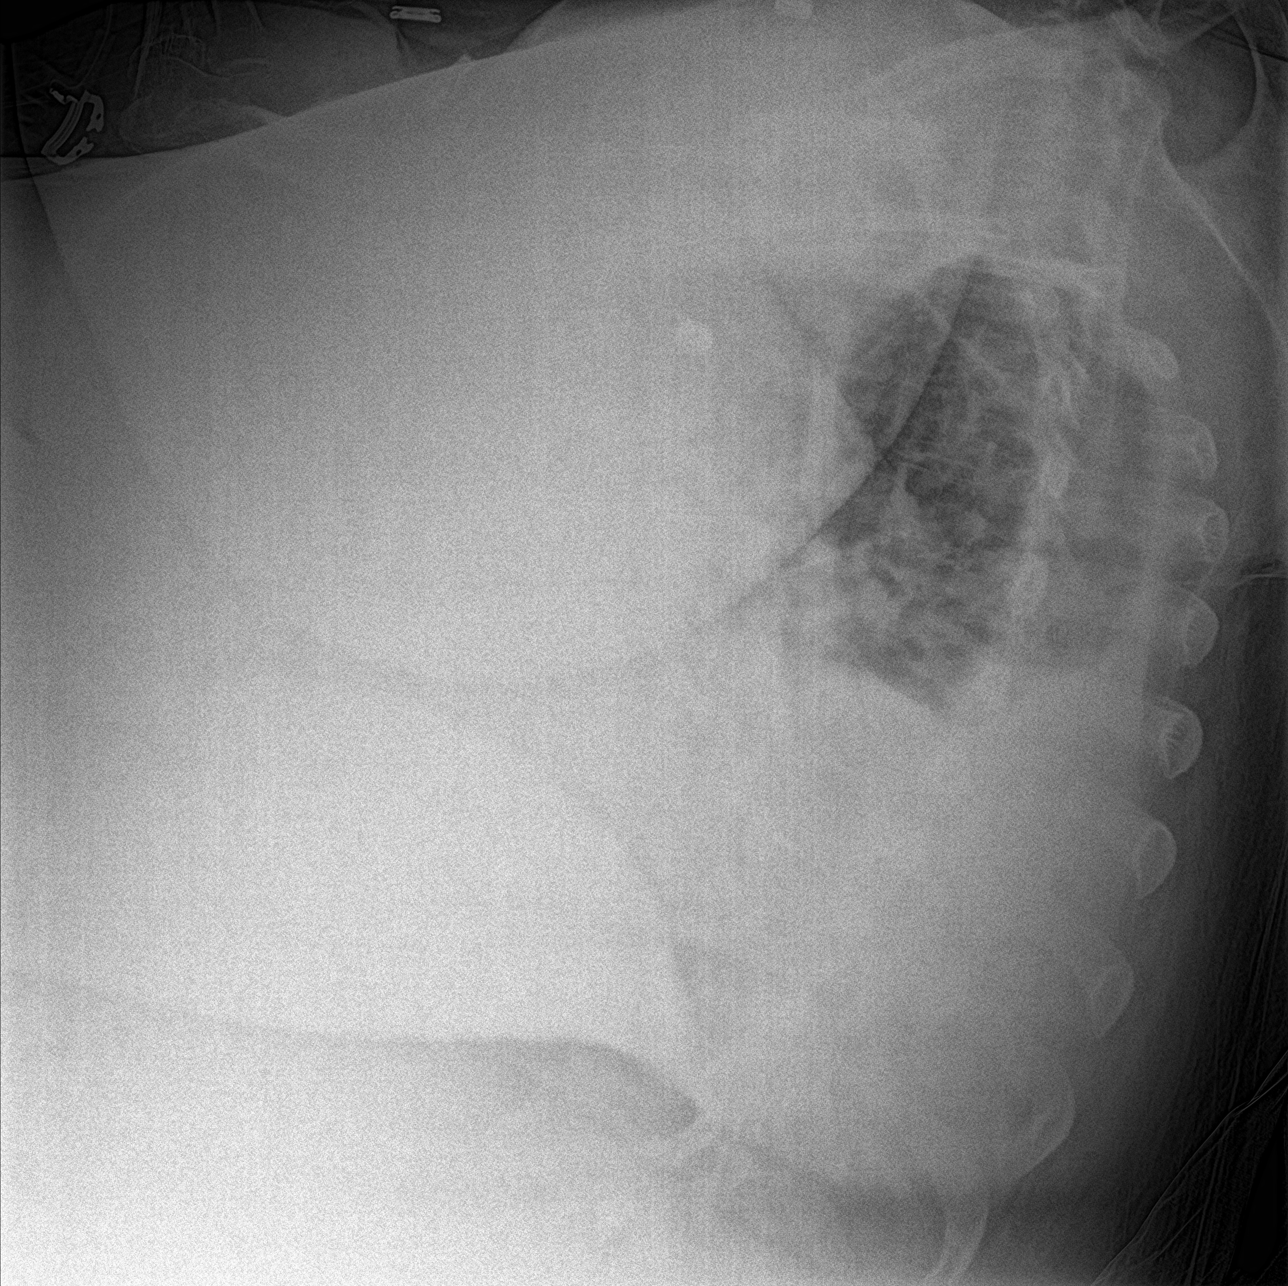

[chest ap]
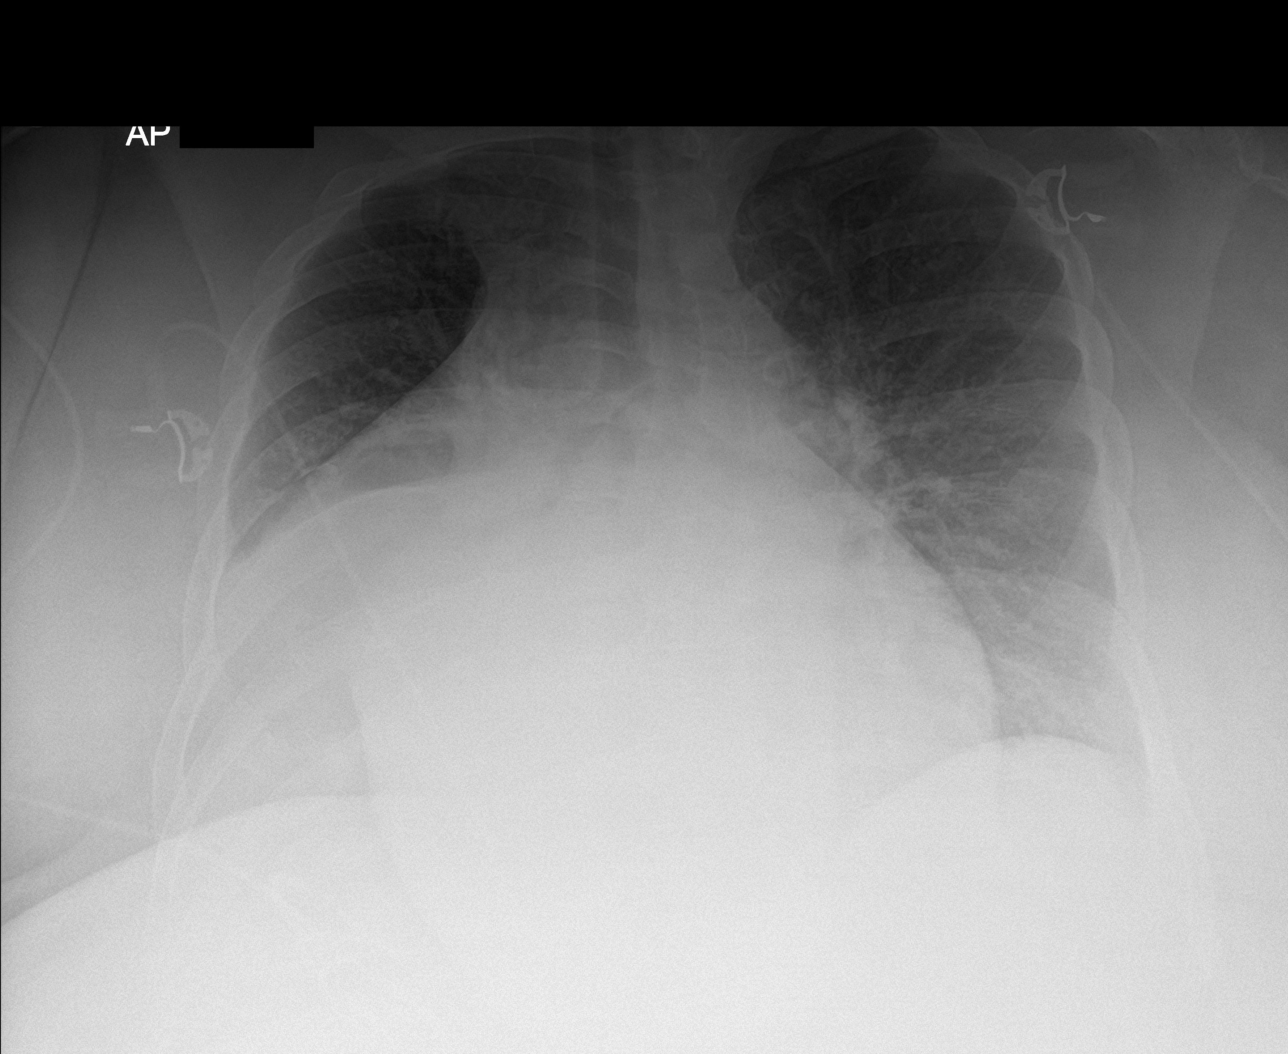

[3 of 3 positions shown; findings below may reference images not displayed]

FINDINGS: Cardiomegaly. Right pleural effusion and right lower lung airspace
disease again noted, similar prior study. Left lung is clear.
IMPRESSION: Cardiomegaly. Continued right effusion and right lower lung airspace
disease.

## 2017-03-08 MED ORDER — METOLAZONE 5 MG PO TABS
5.0000 mg | ORAL_TABLET | Freq: Once | ORAL | Status: AC
  Administered 2017-03-08: 5 mg via ORAL
  Filled 2017-03-08: qty 1

## 2017-03-08 MED ORDER — ALBUTEROL SULFATE (2.5 MG/3ML) 0.083% IN NEBU
3.0000 mL | INHALATION_SOLUTION | RESPIRATORY_TRACT | Status: DC | PRN
  Administered 2017-03-08 – 2017-03-09 (×3): 3 mL via RESPIRATORY_TRACT
  Filled 2017-03-08 (×3): qty 3

## 2017-03-08 MED ORDER — LABETALOL HCL 5 MG/ML IV SOLN: 10.0000 mg | INTRAVENOUS | Status: DC | PRN

## 2017-03-08 MED ORDER — IPRATROPIUM-ALBUTEROL 0.5-2.5 (3) MG/3ML IN SOLN
3.0000 mL | Freq: Four times a day (QID) | RESPIRATORY_TRACT | Status: DC
  Administered 2017-03-09 (×2): 3 mL via RESPIRATORY_TRACT
  Filled 2017-03-08 (×3): qty 3

## 2017-03-08 MED ORDER — HYDRALAZINE HCL 20 MG/ML IJ SOLN: 10.0000 mg | INTRAMUSCULAR | Status: DC | PRN

## 2017-03-08 MED ORDER — METOLAZONE 10 MG PO TABS
10.0000 mg | ORAL_TABLET | Freq: Every day | ORAL | Status: DC
  Administered 2017-03-09: 10 mg via ORAL
  Filled 2017-03-08 (×2): qty 1

## 2017-03-08 MED ORDER — POTASSIUM CHLORIDE 20 MEQ PO PACK
40.0000 meq | PACK | Freq: Once | ORAL | Status: AC
  Administered 2017-03-08: 40 meq via ORAL
  Filled 2017-03-08: qty 2

## 2017-03-08 MED ORDER — PIPERACILLIN-TAZOBACTAM 3.375 G IVPB
3.3750 g | Freq: Three times a day (TID) | INTRAVENOUS | Status: DC
  Administered 2017-03-08 – 2017-03-10 (×6): 3.375 g via INTRAVENOUS
  Filled 2017-03-08 (×10): qty 50

## 2017-03-08 MED ORDER — POTASSIUM CHLORIDE 20 MEQ PO PACK: 20.0000 meq | PACK | Freq: Every day | ORAL | Status: DC

## 2017-03-08 MED ORDER — MAGNESIUM OXIDE 400 (241.3 MG) MG PO TABS
800.0000 mg | ORAL_TABLET | Freq: Every day | ORAL | Status: DC
  Administered 2017-03-08 – 2017-03-10 (×3): 800 mg via ORAL
  Filled 2017-03-08 (×3): qty 2

## 2017-03-08 NOTE — Progress Notes (Signed)
CO2 results reported to MD Marcille Blanco, no new orders at this time. Will continue to monitor.  [name]

## 2017-03-08 NOTE — Progress Notes (Signed)
[place] at [place] NAME: [name]    MR#:  [id number]  DATE OF BIRTH:  12-02-1970  SUBJECTIVE:  CHIEF COMPLAINT:   Chief Complaint  Patient presents with  . Shortness of Breath   Complains of productive cough, continue leg swelling, chest x-ray noted for right airspace disease/right effusion REVIEW OF SYSTEMS:  CONSTITUTIONAL: No fever, fatigue or weakness.  EYES: No blurred or double vision.  EARS, NOSE, AND THROAT: No tinnitus or ear pain.  RESPIRATORY: No cough, shortness of breath, wheezing or hemoptysis.  CARDIOVASCULAR: No chest pain, orthopnea, edema.  GASTROINTESTINAL: No nausea, vomiting, diarrhea or abdominal pain.  GENITOURINARY: No dysuria, hematuria.  ENDOCRINE: No polyuria, nocturia,  HEMATOLOGY: No anemia, easy bruising or bleeding SKIN: No rash or lesion. MUSCULOSKELETAL: No joint pain or arthritis.   NEUROLOGIC: No tingling, numbness, weakness.  PSYCHIATRY: No anxiety or depression.   ROS  DRUG ALLERGIES:   Allergies  Allergen Reactions  . Dust Mite Extract Shortness Of Breath and Itching  . Mold Extract [Trichophyton] Shortness Of Breath and Itching  . Feraheme [Ferumoxytol] Itching  . Pollen Extract   . Sulfa Antibiotics Itching    VITALS:  Blood pressure (!) 144/80, pulse 68, temperature 98 F (36.7 C), temperature source Oral, resp. rate 14, height 5\' 4"  (1.626 m), weight (!) 181.3 kg (399 lb 9.6 oz), last menstrual period 10/14/2012, SpO2 100 %.  PHYSICAL EXAMINATION:  GENERAL:  47 y.o.-year-old patient lying in the bed with no acute distress.  EYES: Pupils equal, round, reactive to light and accommodation. No scleral icterus. Extraocular muscles intact.  HEENT: Head atraumatic, normocephalic. Oropharynx and nasopharynx clear.  NECK:  Supple, no jugular venous distention. No thyroid enlargement, no tenderness.  LUNGS: Normal breath sounds bilaterally, no wheezing, rales,rhonchi or crepitation. No  use of accessory muscles of respiration.  CARDIOVASCULAR: S1, S2 normal. No murmurs, rubs, or gallops.  ABDOMEN: Soft, nontender, nondistended. Bowel sounds present. No organomegaly or mass.  EXTREMITIES: No pedal edema, cyanosis, or clubbing.  NEUROLOGIC: Cranial nerves II through XII are intact. Muscle strength 5/5 in all extremities. Sensation intact. Gait not checked.  PSYCHIATRIC: The patient is alert and oriented x 3.  SKIN: No obvious rash, lesion, or ulcer.   Physical Exam LABORATORY PANEL:   CBC Recent Labs  Lab 03/03/17 1114  WBC 7.4  HGB 9.5*  HCT 30.3*  PLT 380   ------------------------------------------------------------------------------------------------------------------  Chemistries  Recent Labs  Lab 03/03/17 1114  03/07/17 1510 03/08/17 0443  NA 135   < >  --  134*  K 4.1   < >  --  3.8  CL 98*   < >  --  94*  CO2 31   < >  --  30  GLUCOSE 168*   < >  --  133*  BUN 28*   < >  --  41*  CREATININE 2.14*   < >  --  2.72*  CALCIUM 7.7*   < >  --  8.3*  MG  --    < > 1.6*  --   AST 26  --   --   --   ALT 16  --   --   --   ALKPHOS 297*  --   --   --   BILITOT 0.6  --   --   --    < > = values in this interval not displayed.   ------------------------------------------------------------------------------------------------------------------  Cardiac Enzymes Recent Labs  Lab 03/03/17 1114  TROPONINI <0.03   ------------------------------------------------------------------------------------------------------------------  RADIOLOGY:  Dg Chest 2 View  Result Date: 03/08/2017 CLINICAL DATA:  Cough, shortness of Breath EXAM: CHEST  2 VIEW COMPARISON:  03/03/2017 FINDINGS: Cardiomegaly. Right pleural effusion and right lower lung airspace disease again noted, similar prior study. Left lung is clear. IMPRESSION: Cardiomegaly. Continued right effusion and right lower lung airspace disease. Electronically Signed   By: Rolm Baptise M.D.   On: 03/08/2017 09:16     ASSESSMENT AND PLAN:  [name] a47 y.o.femalewith a known history of morbid obesity, diastolic congestive heart failure, chronic, hypertension, CKD stage III, type 2 diabetes comes to the emergency room with increasing shortness of breath and weight gain which patient tells me she is not fitting into her clothes and shoes. Patient does not remember her dry weight.  1 Acute on chronic diastolic congestive heart failure, LV EF: 55% Resolving Most likely has element of cardiorenal syndrome Cardiology and nephrology input greatly appreciated, Continue CHF protocol, IV Lasix, Zaroxolyn, I&O monitoring, daily weights unable to be performed given broken bed, repeat chest x-ray noted for right airspace disease with associated  2 acute HCAP Start empiric Zosyn, check sputum cultures, scheduled breathing treatments, incentive spirometer every hour while awake  3 acute urinary retention Will give trial of discontinuing Foley, use pure wick for now  4 DM 2, controlled Stable ContinueSliding scale insulin  5 chronic extreme morbid obesity Most likely secondary to excess calories Lifestyle modification recommended Patient states she was evaluated for sleep apnea in the last 1-3 years-tests were negative/did not require CPAP, noted ABG for hypercarbia with pH of 7.3-patient without insurance, should undergo repeat sleep study for possible need for CPAP Bariatric surgery would be ideal   6.ARF onCKD stage III BL Cr 2.2 11/18 Creatinine slightly increasedto 2.7 Continue tohold losartan-Resume losartan once creatinine is stable ?May require temporary hemodialysis in the future for fluid removal in d/w nephrology/Dr Kolluru, but not at this time  7 anemia of chronic disease Stable  8 acute on chronic hypoxic respiratory failure Resolved Secondary to congestive heart failure DuoNeb's 4 times daily, currently at baseline 4 L via nasal cannula continuous, blood gas noted  above  Full code Condition stable  All the records are reviewed and case discussed with Care Management/Social Workerr. Management plans discussed with the patient, family and they are in agreement.  CODE STATUS: full  TOTAL TIME TAKING CARE OF THIS PATIENT: 46 minutes.     POSSIBLE D/C IN 2-4 DAYS, DEPENDING ON CLINICAL CONDITION.   Avel Peace Nayvie Lips M.D on 03/08/2017   Between 7am to 6pm - Pager - [phone]  After 6pm go to [email or web address] - password [email or web address] [facility]  Office  [phone]  CC: Primary care physician; [name], NP  Note: This dictation was prepared with Dragon dictation along with smaller phrase technology. Any transcriptional errors that result from this process are unintentional.

## 2017-03-08 NOTE — Progress Notes (Signed)
Progress Note  Patient Name: [name] Date of Encounter: 03/08/2017  Primary Cardiologist: Rockey Situ  Subjective    Coughing up thick green sputum, also brown thick secretions, seen in a cup by the bedside No chest pain.  Still with SOB at rest Slightly improved SOB.  Started on Lasix gtt 1/10 with a dose of metolazone.  1.6 liters negative  Inpatient Medications    Scheduled Meds: . aspirin EC  81 mg Oral Daily  . atorvastatin  80 mg Oral q1800  . Chlorhexidine Gluconate Cloth  6 each Topical Q0600  . cholecalciferol  4,000 Units Oral Weekly  . citalopram  40 mg Oral Daily  . cloNIDine  0.2 mg Oral BID  . docusate sodium  200 mg Oral BID  . guaiFENesin  600 mg Oral BID  . heparin  5,000 Units Subcutaneous Q8H  . hydrALAZINE  100 mg Oral Q8H  . insulin aspart  0-9 Units Subcutaneous TID WC  . magnesium oxide  800 mg Oral Daily  . [START ON 03/09/2017] metolazone  10 mg Oral Daily  . metoprolol tartrate  100 mg Oral BID  . mupirocin ointment  1 application Nasal BID  . sodium chloride flush  3 mL Intravenous Q12H   Continuous Infusions: . albumin human 25 g (03/07/17 1553)  . furosemide (LASIX) infusion 10 mg/hr (03/07/17 1848)   PRN Meds: acetaminophen **OR** acetaminophen, albuterol, bisacodyl, diphenhydrAMINE, ondansetron **OR** ondansetron (ZOFRAN) IV, polyethylene glycol, polyethylene glycol, traMADol   Vital Signs    Vitals:   03/08/17 0134 03/08/17 0442 03/08/17 0756 03/08/17 1215  BP:  (!) 148/74 (!) 150/74 (!) 144/80  Pulse:  68 69 68  Resp:  18 18 14   Temp:  98.1 F (36.7 C) 98 F (36.7 C)   TempSrc:  Oral Oral   SpO2: 99% 100% 100%   Weight:      Height:        Intake/Output Summary (Last 24 hours) at 03/08/2017 1318 Last data filed at 03/08/2017 1255 Gross per 24 hour  Intake -  Output 2225 ml  Net -2225 ml   Filed Weights   03/05/17 0518 03/06/17 0537  Weight: (!) 402 lb 11.2 oz (182.7 kg) (!) 399 lb 9.6 oz (181.3 kg)     Telemetry    NSR - Personally Reviewed  ECG    n/a - Personally Reviewed  Physical Exam   GEN: No acute distress.  Morbidly obese, coughing Neck: JVD difficult to assess 2/2 body habitus. Cardiac: RRR, no murmurs, rubs, or gallops.  Respiratory:  Diminished breath sounds, wheezing on the left, dullness right base GI: Soft, nontender, non-distended.   MS: 2+ chronic woody edema to the thighs; sacral edema; No deformity. Neuro:  Alert and oriented x 3; Nonfocal.  Psych: Normal affect.  Labs    Chemistry Recent Labs  Lab 03/03/17 1114  03/06/17 0440 03/07/17 0450 03/08/17 0443  NA 135   < > 136 136 134*  K 4.1   < > 4.4 4.2 3.8  CL 98*   < > 96* 95* 94*  CO2 31   < > 29 29 30   GLUCOSE 168*   < > 143* 147* 133*  BUN 28*   < > 37* 41* 41*  CREATININE 2.14*   < > 2.56* 2.68* 2.72*  CALCIUM 7.7*   < > 8.2* 8.5* 8.3*  PROT 6.3*  --   --   --   --   ALBUMIN 2.3*  --   --   --   --  AST 26  --   --   --   --   ALT 16  --   --   --   --   ALKPHOS 297*  --   --   --   --   BILITOT 0.6  --   --   --   --   GFRNONAA 27*   < > 21* 20* 20*  GFRAA 31*   < > 25* 24* 23*  ANIONGAP 6   < > 11 12 10    < > = values in this interval not displayed.     Hematology Recent Labs  Lab 03/03/17 1114  WBC 7.4  RBC 3.58*  HGB 9.5*  HCT 30.3*  MCV 84.8  MCH 26.6  MCHC 31.4*  RDW 16.1*  PLT 380    Cardiac Enzymes Recent Labs  Lab 03/03/17 1114  TROPONINI <0.03   No results for input(s): TROPIPOC in the last 168 hours.   BNP Recent Labs  Lab 03/03/17 1114  BNP 617.0*     DDimer No results for input(s): DDIMER in the last 168 hours.   Radiology    Dg Chest 2 View  Result Date: 03/08/2017 CLINICAL DATA:  Cough, shortness of Breath EXAM: CHEST  2 VIEW COMPARISON:  03/03/2017 FINDINGS: Cardiomegaly. Right pleural effusion and right lower lung airspace disease again noted, similar prior study. Left lung is clear. IMPRESSION: Cardiomegaly. Continued right effusion and  right lower lung airspace disease. Electronically Signed   By: Rolm Baptise M.D.   On: 03/08/2017 09:16    Cardiac Studies   TTE 10/2016: Study Conclusions  - Left ventricle: The cavity size was mildly dilated. Systolic function was normal. The estimated ejection fraction was 55%. Wall motion was normal; there were no regional wall motion abnormalities. Doppler parameters are consistent with abnormal left ventricular relaxation (grade 1 diastolic dysfunction). - Mitral valve: There was mild regurgitation. - Atrial septum: No defect or patent foramen ovale was identified.  Impressions:  - Normal LVEF with mild to moderate MR and mild diastolic dysfunction.  Patient Profile     47 y.o. female with history of chronic diastolic heart failure, chronic kidney disease stage III, COPD/asthma, IDDM, anemia of chronic disease, and morbid obesity possible OHSwho was admitted with acute on chronic diastolic CHF.   Assessment & Plan    1. Acute on chronic diastolic CHF: Difficult situation: Multifactorial Morbid obesity, obesity hypoventilation syndrome, renal failure, Also with suspected bronchitis For now would continue Lasix infusion  2. Acute on CKD stage III:  Continue Lasix infusion, 1.6 L negative  3.  Respiratory distress Suspected bronchitis, would add antibiotic given thick green and brown sputum sitting in a cup by the bedside, symptoms getting worse Difficult to manage respiratory status with underlying upper respiratory tract infection  3. HTN:  Stable  4. Morbid obesity/OSA/OHS: Major contributor to her current situation Long-standing history of morbid obesity, getting worse, 400 pounds now Either needs radical steps like gastric bypass surgery or she will continue to decline  Long discussion with patient and husband at the bedside concerning above,  Case discussed with nursing at length  Total encounter time more than 35 minutes  Greater than 50% was  spent in counseling and coordination of care with the patient   For questions or updates, please contact [place] Please consult [email or web address] for contact info under Cardiology/STEMI.    Signed, Signed, Esmond Plants, MD, Ph.D [facility]

## 2017-03-08 NOTE — Clinical Social Work Note (Signed)
Patient does not have insurance, patient plans to return home with home health per case manager and physician notes.  CSW to sign off, please reconsult if other social work needs arise.  Jones Broom. Fisher, MSW, [place]  03/08/2017 12:33 AM

## 2017-03-08 NOTE — Progress Notes (Signed)
Pt. CO2 was 70 on blood gas draw, reported results to nurse.

## 2017-03-08 NOTE — Progress Notes (Signed)
Patient refuses Lipitor 80 mg.  Says she's tired of taking pills. Only drank half of the [place].

## 2017-03-08 NOTE — Progress Notes (Signed)
[facility]  ROUNDING NOTE   Subjective:   Changed to furosemide gtt yesterday. UOP 1.6 liters  Husband at bedside.   Coughing productive sputum  Objective:  Vital signs in last 24 hours:  Temp:  [97.5 F (36.4 C)-98.2 F (36.8 C)] 98 F (36.7 C) (01/12 0756) Pulse Rate:  [65-69] 69 (01/12 0756) Resp:  [18] 18 (01/12 0756) BP: (139-158)/(74-82) 150/74 (01/12 0756) SpO2:  [97 %-100 %] 100 % (01/12 0756)  Weight change:  Filed Weights   03/05/17 0518 03/06/17 0537  Weight: (!) 182.7 kg (402 lb 11.2 oz) (!) 181.3 kg (399 lb 9.6 oz)    Intake/Output: I/O last 3 completed shifts: In: 3 [I.V.:3] Out: 2650 [Urine:2650]   Intake/Output this shift:  No intake/output data recorded.  Physical Exam: General: NAD, laying in bed  Head: Normocephalic, atraumatic. Moist oral mucosal membranes  Eyes: Anicteric, PERRL  Neck: Supple, trachea midline  Lungs:  Diminished bilaterally  Heart: Regular rate and rhythm  Abdomen:  Soft, nontender, +abdominal wall edema  Extremities: 1+ peripheral edema.  Neurologic: Nonfocal, moving all four extremities  Skin: No lesions       Basic Metabolic Panel: Recent Labs  Lab 03/04/17 0603 03/05/17 0349 03/06/17 0440 03/07/17 0450 03/07/17 1510 03/08/17 0443  NA 134* 133* 136 136  --  134*  K 4.3 4.8 4.4 4.2  --  3.8  CL 98* 97* 96* 95*  --  94*  CO2 30 26 29 29   --  30  GLUCOSE 176* 137* 143* 147*  --  133*  BUN 31* 34* 37* 41*  --  41*  CREATININE 2.29* 2.52* 2.56* 2.68*  --  2.72*  CALCIUM 7.6* 8.0* 8.2* 8.5*  --  8.3*  MG  --   --  1.5*  --  1.6*  --     Liver Function Tests: Recent Labs  Lab 03/03/17 1114  AST 26  ALT 16  ALKPHOS 297*  BILITOT 0.6  PROT 6.3*  ALBUMIN 2.3*   No results for input(s): LIPASE, AMYLASE in the last 168 hours. No results for input(s): AMMONIA in the last 168 hours.  CBC: Recent Labs  Lab 03/03/17 1114  WBC 7.4  NEUTROABS 5.8  HGB 9.5*  HCT 30.3*  MCV 84.8  PLT 380     Cardiac Enzymes: Recent Labs  Lab 03/03/17 1114  TROPONINI <0.03    BNP: Invalid input(s): POCBNP  CBG: Recent Labs  Lab 03/07/17 1112 03/07/17 1534 03/07/17 1652 03/07/17 2047 03/08/17 0741  GLUCAP 150* 158* 141* 129* 138*    Microbiology: Results for orders placed or performed during the hospital encounter of 03/03/17  MRSA PCR Screening     Status: Abnormal   Collection Time: 03/04/17  9:24 AM  Result Value Ref Range Status   MRSA by PCR POSITIVE (A) NEGATIVE Final    Comment:        The GeneXpert MRSA Assay (FDA approved for NASAL specimens only), is one component of a comprehensive MRSA colonization surveillance program. It is not intended to diagnose MRSA infection nor to guide or monitor treatment for MRSA infections. CRITICAL RESULT CALLED TO, READ BACK BY AND VERIFIED WITH: [name] 03/04/17 1141 KLW Performed at [facility], [address] [place], [place] [address]     Coagulation Studies: No results for input(s): LABPROT, INR in the last 72 hours.  Urinalysis: No results for input(s): COLORURINE, LABSPEC, PHURINE, GLUCOSEU, HGBUR, BILIRUBINUR, KETONESUR, PROTEINUR, UROBILINOGEN, NITRITE, LEUKOCYTESUR in the last 72  hours.  Invalid input(s): APPERANCEUR    Imaging: Dg Chest 2 View  Result Date: 03/08/2017 CLINICAL DATA:  Cough, shortness of Breath EXAM: CHEST  2 VIEW COMPARISON:  03/03/2017 FINDINGS: Cardiomegaly. Right pleural effusion and right lower lung airspace disease again noted, similar prior study. Left lung is clear. IMPRESSION: Cardiomegaly. Continued right effusion and right lower lung airspace disease. Electronically Signed   By: Rolm Baptise M.D.   On: 03/08/2017 09:16     Medications:   . albumin human 25 g (03/07/17 1553)  . furosemide (LASIX) infusion 10 mg/hr (03/07/17 1848)   . aspirin EC  81 mg Oral Daily  . atorvastatin  80 mg Oral q1800  . Chlorhexidine Gluconate Cloth  6 each Topical Q0600  .  cholecalciferol  4,000 Units Oral Weekly  . citalopram  40 mg Oral Daily  . cloNIDine  0.2 mg Oral BID  . docusate sodium  200 mg Oral BID  . guaiFENesin  600 mg Oral BID  . heparin  5,000 Units Subcutaneous Q8H  . hydrALAZINE  100 mg Oral Q8H  . insulin aspart  0-9 Units Subcutaneous TID WC  . magnesium oxide  800 mg Oral Daily  . [START ON 03/09/2017] metolazone  10 mg Oral Daily  . metolazone  5 mg Oral Once  . metoprolol tartrate  100 mg Oral BID  . mupirocin ointment  1 application Nasal BID  . sodium chloride flush  3 mL Intravenous Q12H   acetaminophen **OR** acetaminophen, albuterol, bisacodyl, diphenhydrAMINE, ondansetron **OR** ondansetron (ZOFRAN) IV, polyethylene glycol, polyethylene glycol, traMADol  Assessment/ Plan:  [name] is a 47 y.o. black female with morbid obesity, insulin-dependent diabetes mellitus type II, obesity, anemia, diastolic congestive heart failure, COPD/asthma, hypertension,   1. Acute renal failure on chronic kidney disease stage III with nephrotic range proteinuria: baseline creatinine 2.23, GFR of 29 on 01/10/17 Chronic kidney disease with proteinuria secondary to diabetic nephropathy, hypertension Nephrotic syndrome with anasarca and hypoalbuminemia: 6.9grams of proteinuria on 09/12/2016 Acute renal failure from acute cardiorenal syndrome  - furosemide gtt - IV albumin for one more day  2. Acute exacerbation of diastolic congestive heart failure with hypertension. Echocardiogram 11/12/16 - Appreciate cardiology input.   3. Diabetes mellitus type II with chronic kidney disease: insulin dependent - hemoglobin A1c 7.3%   LOS: 5 [name] 1/12/201910:28 AM

## 2017-03-08 NOTE — Plan of Care (Signed)
  Progressing Education: Knowledge of General Education information will improve 03/08/2017 0135 - Progressing by Loran Senters, RN Education: Ability to demonstrate management of disease process will improve 03/08/2017 0135 - Progressing by Loran Senters, RN Ability to verbalize understanding of medication therapies will improve 03/08/2017 0135 - Progressing by Loran Senters, RN

## 2017-03-09 DIAGNOSIS — R0600 Dyspnea, unspecified: Secondary | ICD-10-CM

## 2017-03-09 DIAGNOSIS — E662 Morbid (severe) obesity with alveolar hypoventilation: Secondary | ICD-10-CM | POA: Diagnosis present

## 2017-03-09 LAB — CBC
HEMATOCRIT: 31.4 % — AB (ref 35.0–47.0)
Hemoglobin: 9.9 g/dL — ABNORMAL LOW (ref 12.0–16.0)
MCH: 26.9 pg (ref 26.0–34.0)
MCHC: 31.6 g/dL — ABNORMAL LOW (ref 32.0–36.0)
MCV: 85.1 fL (ref 80.0–100.0)
PLATELETS: 369 10*3/uL (ref 150–440)
RBC: 3.69 MIL/uL — AB (ref 3.80–5.20)
RDW: 15.8 % — AB (ref 11.5–14.5)
WBC: 10.4 10*3/uL (ref 3.6–11.0)

## 2017-03-09 LAB — BASIC METABOLIC PANEL
ANION GAP: 13 (ref 5–15)
BUN: 42 mg/dL — ABNORMAL HIGH (ref 6–20)
CO2: 30 mmol/L (ref 22–32)
Calcium: 8.5 mg/dL — ABNORMAL LOW (ref 8.9–10.3)
Chloride: 92 mmol/L — ABNORMAL LOW (ref 101–111)
Creatinine, Ser: 2.76 mg/dL — ABNORMAL HIGH (ref 0.44–1.00)
GFR calc non Af Amer: 20 mL/min — ABNORMAL LOW (ref >60)
GFR, EST AFRICAN AMERICAN: 23 mL/min — AB (ref >60)
Glucose, Bld: 136 mg/dL — ABNORMAL HIGH (ref 65–99)
POTASSIUM: 3.6 mmol/L (ref 3.5–5.1)
Sodium: 135 mmol/L (ref 135–145)

## 2017-03-09 LAB — GLUCOSE, CAPILLARY
GLUCOSE-CAPILLARY: 145 mg/dL — AB (ref 65–99)
GLUCOSE-CAPILLARY: 122 mg/dL — AB (ref 65–99)
GLUCOSE-CAPILLARY: 148 mg/dL — AB (ref 65–99)

## 2017-03-09 MED ORDER — IPRATROPIUM-ALBUTEROL 0.5-2.5 (3) MG/3ML IN SOLN
3.0000 mL | RESPIRATORY_TRACT | Status: DC | PRN
  Administered 2017-03-09 – 2017-03-10 (×2): 3 mL via RESPIRATORY_TRACT
  Filled 2017-03-09 (×2): qty 3

## 2017-03-09 MED ORDER — POTASSIUM CHLORIDE CRYS ER 20 MEQ PO TBCR
20.0000 meq | EXTENDED_RELEASE_TABLET | Freq: Every day | ORAL | Status: DC
  Administered 2017-03-09 – 2017-03-10 (×2): 20 meq via ORAL
  Filled 2017-03-09 (×2): qty 1

## 2017-03-09 MED ORDER — TORSEMIDE 100 MG PO TABS
100.0000 mg | ORAL_TABLET | Freq: Two times a day (BID) | ORAL | Status: DC
  Administered 2017-03-09 – 2017-03-10 (×4): 100 mg via ORAL
  Filled 2017-03-09 (×4): qty 1

## 2017-03-09 NOTE — Progress Notes (Signed)
[facility]  ROUNDING NOTE   Subjective:   furosemide gtt stopped this morning. UOP 1.425 liters  Husband at bedside.   4 L [place]  Objective:  Vital signs in last 24 hours:  Temp:  [98.6 F (37 C)-99 F (37.2 C)] 99 F (37.2 C) (01/13 0843) Pulse Rate:  [68-80] 80 (01/13 0843) Resp:  [14-18] 18 (01/13 0511) BP: (134-174)/(73-83) 134/73 (01/13 0843) SpO2:  [95 %-100 %] 99 % (01/13 0843) Weight:  [177.8 kg (391 lb 14.4 oz)] 177.8 kg (391 lb 14.4 oz) (01/13 0511)  Weight change:  Filed Weights   03/06/17 0537 03/09/17 0511  Weight: (!) 181.3 kg (399 lb 9.6 oz) (!) 177.8 kg (391 lb 14.4 oz)    Intake/Output: I/O last 3 completed shifts: In: 370 [I.V.:120; IV Piggyback:250] Out: 3075 [Urine:3075]   Intake/Output this shift:  No intake/output data recorded.  Physical Exam: General: NAD, laying in bed  Head: Normocephalic, atraumatic. Moist oral mucosal membranes  Eyes: Anicteric, PERRL  Neck: Supple, trachea midline  Lungs:  Diminished bilaterally, 4 L [place]  Heart: Regular rate and rhythm  Abdomen:  Soft, nontender, +abdominal wall edema  Extremities: 1+ peripheral edema.  Neurologic: Nonfocal, moving all four extremities  Skin: No lesions       Basic Metabolic Panel: Recent Labs  Lab 03/05/17 0349 03/06/17 0440 03/07/17 0450 03/07/17 1510 03/08/17 0443 03/09/17 0548  NA 133* 136 136  --  134* 135  K 4.8 4.4 4.2  --  3.8 3.6  CL 97* 96* 95*  --  94* 92*  CO2 26 29 29   --  30 30  GLUCOSE 137* 143* 147*  --  133* 136*  BUN 34* 37* 41*  --  41* 42*  CREATININE 2.52* 2.56* 2.68*  --  2.72* 2.76*  CALCIUM 8.0* 8.2* 8.5*  --  8.3* 8.5*  MG  --  1.5*  --  1.6*  --   --     Liver Function Tests: Recent Labs  Lab 03/03/17 1114  AST 26  ALT 16  ALKPHOS 297*  BILITOT 0.6  PROT 6.3*  ALBUMIN 2.3*   No results for input(s): LIPASE, AMYLASE in the last 168 hours. No results for input(s): AMMONIA in the last 168 hours.  CBC: Recent Labs  Lab  03/03/17 1114 03/09/17 0548  WBC 7.4 10.4  NEUTROABS 5.8  --   HGB 9.5* 9.9*  HCT 30.3* 31.4*  MCV 84.8 85.1  PLT 380 369    Cardiac Enzymes: Recent Labs  Lab 03/03/17 1114  TROPONINI <0.03    BNP: Invalid input(s): POCBNP  CBG: Recent Labs  Lab 03/08/17 0741 03/08/17 1213 03/08/17 1651 03/08/17 2108 03/09/17 0817  GLUCAP 138* 127* 126* 137* 145*    Microbiology: Results for orders placed or performed during the hospital encounter of 03/03/17  MRSA PCR Screening     Status: Abnormal   Collection Time: 03/04/17  9:24 AM  Result Value Ref Range Status   MRSA by PCR POSITIVE (A) NEGATIVE Final    Comment:        The GeneXpert MRSA Assay (FDA approved for NASAL specimens only), is one component of a comprehensive MRSA colonization surveillance program. It is not intended to diagnose MRSA infection nor to guide or monitor treatment for MRSA infections. CRITICAL RESULT CALLED TO, READ BACK BY AND VERIFIED WITH: JESSICA CHRISTMAS 03/04/17 1141 KLW Performed at [facility], [address], [place], [place] [address]     Coagulation Studies: No  results for input(s): LABPROT, INR in the last 72 hours.  Urinalysis: No results for input(s): COLORURINE, LABSPEC, PHURINE, GLUCOSEU, HGBUR, BILIRUBINUR, KETONESUR, PROTEINUR, UROBILINOGEN, NITRITE, LEUKOCYTESUR in the last 72 hours.  Invalid input(s): APPERANCEUR    Imaging: Dg Chest 2 View  Result Date: 03/08/2017 CLINICAL DATA:  Cough, shortness of Breath EXAM: CHEST  2 VIEW COMPARISON:  03/03/2017 FINDINGS: Cardiomegaly. Right pleural effusion and right lower lung airspace disease again noted, similar prior study. Left lung is clear. IMPRESSION: Cardiomegaly. Continued right effusion and right lower lung airspace disease. Electronically Signed   By: Rolm Baptise M.D.   On: 03/08/2017 09:16     Medications:   . piperacillin-tazobactam 3.375 g (03/09/17 0621)   . aspirin EC  81 mg Oral Daily  .  atorvastatin  80 mg Oral q1800  . cholecalciferol  4,000 Units Oral Weekly  . citalopram  40 mg Oral Daily  . cloNIDine  0.2 mg Oral BID  . docusate sodium  200 mg Oral BID  . guaiFENesin  600 mg Oral BID  . heparin  5,000 Units Subcutaneous Q8H  . hydrALAZINE  100 mg Oral Q8H  . insulin aspart  0-9 Units Subcutaneous TID WC  . ipratropium-albuterol  3 mL Nebulization QID  . magnesium oxide  800 mg Oral Daily  . metolazone  10 mg Oral Daily  . metoprolol tartrate  100 mg Oral BID  . potassium chloride  20 mEq Oral Daily  . sodium chloride flush  3 mL Intravenous Q12H  . torsemide  100 mg Oral BID   acetaminophen **OR** acetaminophen, albuterol, bisacodyl, diphenhydrAMINE, hydrALAZINE, labetalol, ondansetron **OR** ondansetron (ZOFRAN) IV, polyethylene glycol, polyethylene glycol, traMADol  Assessment/ Plan:  [name] is a 47 y.o. black female with morbid obesity, insulin-dependent diabetes mellitus type II, obesity, anemia, diastolic congestive heart failure, COPD/asthma, hypertension,   1. Acute renal failure on chronic kidney disease stage III with nephrotic range proteinuria: baseline creatinine 2.23, GFR of 29 on 01/10/17 Chronic kidney disease with proteinuria secondary to diabetic nephropathy, hypertension Nephrotic syndrome with anasarca and hypoalbuminemia: 6.9grams of proteinuria on 09/12/2016 Acute renal failure from acute cardiorenal syndrome  - Transition from furosemide gtt to torsemide 100mg  bid PO - Continue metolazone - Low salt diet, fluid restriction  2. Acute exacerbation of diastolic congestive heart failure with hypertension. Blood pressure at goal.  Echocardiogram 11/12/16 - Appreciate cardiology input.   3. Diabetes mellitus type II with chronic kidney disease: insulin dependent - hemoglobin A1c 7.3%  4. Anemia with chronic kidney disease: hemoglobin 9.9 - Discussed ESA with patient and husband     LOS: 6 [name] Patient 1/13/201910:28  AM

## 2017-03-09 NOTE — Progress Notes (Signed)
[facility] NAME: [name]    MR#:  [id number]  DATE OF BIRTH:  07-21-1970  SUBJECTIVE:  CHIEF COMPLAINT:   Chief Complaint  Patient presents with  . Shortness of Breath  Patient feeling better, significant other at the bedside, cardiology/nephrology notes reviewed, diuretics changed to p.o.  REVIEW OF SYSTEMS:  CONSTITUTIONAL: No fever, fatigue or weakness.  EYES: No blurred or double vision.  EARS, NOSE, AND THROAT: No tinnitus or ear pain.  RESPIRATORY: No cough, shortness of breath, wheezing or hemoptysis.  CARDIOVASCULAR: No chest pain, orthopnea, edema.  GASTROINTESTINAL: No nausea, vomiting, diarrhea or abdominal pain.  GENITOURINARY: No dysuria, hematuria.  ENDOCRINE: No polyuria, nocturia,  HEMATOLOGY: No anemia, easy bruising or bleeding SKIN: No rash or lesion. MUSCULOSKELETAL: No joint pain or arthritis.   NEUROLOGIC: No tingling, numbness, weakness.  PSYCHIATRY: No anxiety or depression.   ROS  DRUG ALLERGIES:   Allergies  Allergen Reactions  . Dust Mite Extract Shortness Of Breath and Itching  . Mold Extract [Trichophyton] Shortness Of Breath and Itching  . Feraheme [Ferumoxytol] Itching  . Pollen Extract   . Sulfa Antibiotics Itching    VITALS:  Blood pressure 134/73, pulse 80, temperature 99 F (37.2 C), temperature source Oral, resp. rate 18, height 5\' 4"  (1.626 m), weight (!) 177.8 kg (391 lb 14.4 oz), last menstrual period 10/14/2012, SpO2 99 %.  PHYSICAL EXAMINATION:  GENERAL:  47 y.o.-year-old patient lying in the bed with no acute distress.  Extreme morbid obesity EYES: Pupils equal, round, reactive to light and accommodation. No scleral icterus. Extraocular muscles intact.  HEENT: Head atraumatic, normocephalic. Oropharynx and nasopharynx clear.  NECK:  Supple, no jugular venous distention. No thyroid enlargement, no tenderness.  LUNGS: Diminished breath sounds throughout, no use of  accessory muscles of respiration.  CARDIOVASCULAR: S1, S2 normal. No murmurs, rubs, or gallops.  ABDOMEN: Soft, nontender, nondistended. Bowel sounds present. No organomegaly or mass.  EXTREMITIES: No pedal edema, cyanosis, or clubbing.  NEUROLOGIC: Cranial nerves II through XII are intact. MAES. Gait not checked.  PSYCHIATRIC: The patient is alert and oriented x 3.  SKIN: No obvious rash, lesion, or ulcer.   Physical Exam LABORATORY PANEL:   CBC Recent Labs  Lab 03/09/17 0548  WBC 10.4  HGB 9.9*  HCT 31.4*  PLT 369   ------------------------------------------------------------------------------------------------------------------  Chemistries  Recent Labs  Lab 03/03/17 1114  03/07/17 1510  03/09/17 0548  NA 135   < >  --    < > 135  K 4.1   < >  --    < > 3.6  CL 98*   < >  --    < > 92*  CO2 31   < >  --    < > 30  GLUCOSE 168*   < >  --    < > 136*  BUN 28*   < >  --    < > 42*  CREATININE 2.14*   < >  --    < > 2.76*  CALCIUM 7.7*   < >  --    < > 8.5*  MG  --    < > 1.6*  --   --   AST 26  --   --   --   --   ALT 16  --   --   --   --   ALKPHOS 297*  --   --   --   --  BILITOT 0.6  --   --   --   --    < > = values in this interval not displayed.   ------------------------------------------------------------------------------------------------------------------  Cardiac Enzymes Recent Labs  Lab 03/03/17 1114  TROPONINI <0.03   ------------------------------------------------------------------------------------------------------------------  RADIOLOGY:  Dg Chest 2 View  Result Date: 03/08/2017 CLINICAL DATA:  Cough, shortness of Breath EXAM: CHEST  2 VIEW COMPARISON:  03/03/2017 FINDINGS: Cardiomegaly. Right pleural effusion and right lower lung airspace disease again noted, similar prior study. Left lung is clear. IMPRESSION: Cardiomegaly. Continued right effusion and right lower lung airspace disease. Electronically Signed   By: Rolm Baptise M.D.   On:  03/08/2017 09:16    ASSESSMENT AND PLAN:  [name] a47 y.o.femalewith a known history of morbid obesity, diastolic congestive heart failure, chronic, hypertension, CKD stage III, type 2 diabetes comes to the emergency room with increasing shortness of breath and weight gain which patient tells me she is not fitting into her clothes and shoes. Patient does not remember her dry weight.  1 Acute on chronic diastolic congestive heart failure, LV EF: 55% Resolved Most likely has element of cardiorenal syndrome Cardiology and nephrology input greatly appreciated, continue CHF protocol,on torsemide twice daily, Zaroxolyn, continue close medical monitoring  2 acute HCAP Continue empiric Zosyn, BTs, IS  3 acute urinary retention Resolved Foley removed March 08, 2017   4 DM 2, controlled Stable ContinueSSI  5chronic extreme morbid obesity Most likely secondary to excess calories Lifestyle modification recommended Patient states she was evaluated for sleep apnea in the last 1-3 years-tests were negative/did not require CPAP, noted ABG for hypercarbia with pH of 7.3-patient without insurance, should undergo repeat sleep study for possible need for CPAP Bariatric surgery would be ideal   6.ARF onCKD stage III BL Cr 2.2 11/18 Creatinine slightly increasedto 2.7 Continue tohold losartan-Resume losartan once creatinine is stable ?May require temporary hemodialysis in the futurefor fluid removalin d/wnephrology/Dr Kolluru, butnot at this time  7anemia of chronic disease Stable  8acute on chronic hypoxic respiratory failure Resolved Secondary to congestive heart failure DuoNeb's 4 times daily, currently at baseline 4 L via nasal cannula continuous, blood gas noted above  Full code Condition stable  All the records are reviewed and case discussed with Care Management/Social Workerr. Management plans discussed with the patient, family and they are in  agreement.  CODE STATUS: full TOTAL TIME TAKING CARE OF THIS PATIENT: 35 minutes.     POSSIBLE D/C IN 1-2 DAYS, DEPENDING ON CLINICAL CONDITION.   Avel Peace Salary M.D on 03/09/2017   Between 7am to 6pm - Pager - [phone]  After 6pm go to [email or web address] - password [email or web address] [facility]  Office  [phone]  CC: Primary care physician; [name], NP  Note: This dictation was prepared with Dragon dictation along with smaller phrase technology. Any transcriptional errors that result from this process are unintentional.

## 2017-03-09 NOTE — Progress Notes (Signed)
Patient refused to take all of BP medications despite BP being up at 174/83.  Also refused lasix drip and insisted that it be turned off. Will continue to monitor.

## 2017-03-09 NOTE — Progress Notes (Signed)
Progress Note  Patient Name: [name] Date of Encounter: 03/09/2017  Primary Cardiologist: Rockey Situ  Subjective   Sitting up at the bedside this morning, tired, continued cough with sputum Started on broad-spectrum antibiotics No chest pain.  Refusing several medications, eyes dry, mouth dry, does not want Lasix infusion anymore but feels she still has fluid She was on Lasix gtt for 3 days Notes indicating -1 L past 24 hours, 5.8 L negative Feels that she has more  Feels anxious, depressed  Inpatient Medications    Scheduled Meds: . aspirin EC  81 mg Oral Daily  . atorvastatin  80 mg Oral q1800  . cholecalciferol  4,000 Units Oral Weekly  . citalopram  40 mg Oral Daily  . cloNIDine  0.2 mg Oral BID  . docusate sodium  200 mg Oral BID  . guaiFENesin  600 mg Oral BID  . heparin  5,000 Units Subcutaneous Q8H  . hydrALAZINE  100 mg Oral Q8H  . insulin aspart  0-9 Units Subcutaneous TID WC  . magnesium oxide  800 mg Oral Daily  . metolazone  10 mg Oral Daily  . metoprolol tartrate  100 mg Oral BID  . potassium chloride  20 mEq Oral Daily  . sodium chloride flush  3 mL Intravenous Q12H  . torsemide  100 mg Oral BID   Continuous Infusions: . piperacillin-tazobactam Stopped (03/09/17 1056)   PRN Meds: acetaminophen **OR** acetaminophen, bisacodyl, diphenhydrAMINE, hydrALAZINE, ipratropium-albuterol, labetalol, ondansetron **OR** ondansetron (ZOFRAN) IV, polyethylene glycol, polyethylene glycol, traMADol   Vital Signs    Vitals:   03/08/17 1944 03/09/17 0000 03/09/17 0511 03/09/17 0843  BP: (!) 174/83 (!) 155/82 (!) 159/75 134/73  Pulse: 75  79 80  Resp: 18  18   Temp: 98.6 F (37 C)  99 F (37.2 C) 99 F (37.2 C)  TempSrc: Oral  Oral Oral  SpO2: 100%  95% 99%  Weight:   (!) 391 lb 14.4 oz (177.8 kg)   Height:        Intake/Output Summary (Last 24 hours) at 03/09/2017 1241 Last data filed at 03/08/2017 2055 Gross per 24 hour  Intake 370 ml  Output 1425  ml  Net -1055 ml   Filed Weights   03/06/17 0537 03/09/17 0511  Weight: (!) 399 lb 9.6 oz (181.3 kg) (!) 391 lb 14.4 oz (177.8 kg)    Telemetry    NSR - Personally Reviewed  ECG    n/a - Personally Reviewed  Physical Exam   GEN: No acute distress.  Morbidly obese Neck: JVD difficult to assess 2/2 body habitus. Cardiac: RRR, no murmurs, rubs, or gallops.  Respiratory:  Diminished breath sounds, rales at the bases, scant wheezing GI: Soft, nontender, non-distended.   MS: 1+ chronic woody edema to the thighs; sacral edema; No deformity. Neuro:  Alert and oriented x 3; Nonfocal.  Psych: Normal affect.  Labs    Chemistry Recent Labs  Lab 03/03/17 1114  03/07/17 0450 03/08/17 0443 03/09/17 0548  NA 135   < > 136 134* 135  K 4.1   < > 4.2 3.8 3.6  CL 98*   < > 95* 94* 92*  CO2 31   < > 29 30 30   GLUCOSE 168*   < > 147* 133* 136*  BUN 28*   < > 41* 41* 42*  CREATININE 2.14*   < > 2.68* 2.72* 2.76*  CALCIUM 7.7*   < > 8.5* 8.3* 8.5*  PROT 6.3*  --   --   --   --  ALBUMIN 2.3*  --   --   --   --   AST 26  --   --   --   --   ALT 16  --   --   --   --   ALKPHOS 297*  --   --   --   --   BILITOT 0.6  --   --   --   --   GFRNONAA 27*   < > 20* 20* 20*  GFRAA 31*   < > 24* 23* 23*  ANIONGAP 6   < > 12 10 13    < > = values in this interval not displayed.     Hematology Recent Labs  Lab 03/03/17 1114 03/09/17 0548  WBC 7.4 10.4  RBC 3.58* 3.69*  HGB 9.5* 9.9*  HCT 30.3* 31.4*  MCV 84.8 85.1  MCH 26.6 26.9  MCHC 31.4* 31.6*  RDW 16.1* 15.8*  PLT 380 369    Cardiac Enzymes Recent Labs  Lab 03/03/17 1114  TROPONINI <0.03   No results for input(s): TROPIPOC in the last 168 hours.   BNP Recent Labs  Lab 03/03/17 1114  BNP 617.0*     DDimer No results for input(s): DDIMER in the last 168 hours.   Radiology    Dg Chest 2 View  Result Date: 03/08/2017 CLINICAL DATA:  Cough, shortness of Breath EXAM: CHEST  2 VIEW COMPARISON:  03/03/2017 FINDINGS:  Cardiomegaly. Right pleural effusion and right lower lung airspace disease again noted, similar prior study. Left lung is clear. IMPRESSION: Cardiomegaly. Continued right effusion and right lower lung airspace disease. Electronically Signed   By: Rolm Baptise M.D.   On: 03/08/2017 09:16    Cardiac Studies   TTE 10/2016: Study Conclusions  - Left ventricle: The cavity size was mildly dilated. Systolic function was normal. The estimated ejection fraction was 55%. Wall motion was normal; there were no regional wall motion abnormalities. Doppler parameters are consistent with abnormal left ventricular relaxation (grade 1 diastolic dysfunction). - Mitral valve: There was mild regurgitation. - Atrial septum: No defect or patent foramen ovale was identified.  Impressions:  - Normal LVEF with mild to moderate MR and mild diastolic dysfunction.  Patient Profile     47 y.o. female with history of chronic diastolic heart failure, chronic kidney disease stage III, COPD/asthma, IDDM, anemia of chronic disease, and morbid obesity possible OHSwho was admitted with acute on chronic diastolic CHF.   Assessment & Plan    1. Acute on chronic diastolic CHF: Difficult situation: Multifactorial Morbid obesity, obesity hypoventilation syndrome, renal failure, upper respiratory tract infection  Patient electing to stop Lasix infusion Transition to torsemide 100 twice daily  2. Acute on CKD stage III:  Followed by nephrology Creatinine 2.7, BUN 40, stable over the past several days  3.  Respiratory distress Multifactorial including morbid obesity with obesity hypoventilation,  Upper respiratory tract infection with a productive green brown sputum Now on broad-spectrum antibiotics  3. HTN:  Stable  4. Morbid obesity/OSA/OHS: Major contributor to her current situation 400 pounds In her current state she is a poor candidate for gastric bypass surgery Suspect underlying depression a  contributor   Total encounter time more than 25 minutes  Greater than 50% was spent in counseling and coordination of care with the patient  For questions or updates, please contact [place] Please consult [email or web address] for contact info under Cardiology/STEMI.    Signed, Signed, Esmond Plants, MD, Ph.D [facility]

## 2017-03-09 NOTE — Plan of Care (Signed)
  Health Behavior/Discharge Planning: Ability to manage health-related needs will improve 03/09/2017 0105 - Not Progressing by Marylouise Stacks, RN   Activity: Risk for activity intolerance will decrease 03/09/2017 0105 - Not Progressing by Marylouise Stacks, RN   Activity: Capacity to carry out activities will improve 03/09/2017 0105 - Not Progressing by Marylouise Stacks, RN  Patient refusing any activity or exertion.

## 2017-03-10 LAB — BASIC METABOLIC PANEL
Anion gap: 12 (ref 5–15)
BUN: 42 mg/dL — AB (ref 6–20)
CALCIUM: 8.5 mg/dL — AB (ref 8.9–10.3)
CHLORIDE: 91 mmol/L — AB (ref 101–111)
CO2: 31 mmol/L (ref 22–32)
CREATININE: 2.85 mg/dL — AB (ref 0.44–1.00)
GFR calc Af Amer: 22 mL/min — ABNORMAL LOW (ref >60)
GFR calc non Af Amer: 19 mL/min — ABNORMAL LOW (ref >60)
Glucose, Bld: 150 mg/dL — ABNORMAL HIGH (ref 65–99)
Potassium: 3.7 mmol/L (ref 3.5–5.1)
Sodium: 134 mmol/L — ABNORMAL LOW (ref 135–145)

## 2017-03-10 LAB — GLUCOSE, CAPILLARY
Glucose-Capillary: 160 mg/dL — ABNORMAL HIGH (ref 65–99)
Glucose-Capillary: 142 mg/dL — ABNORMAL HIGH (ref 65–99)
Glucose-Capillary: 128 mg/dL — ABNORMAL HIGH (ref 65–99)

## 2017-03-10 MED ORDER — TORSEMIDE 100 MG PO TABS: 100.0000 mg | ORAL_TABLET | Freq: Two times a day (BID) | ORAL | 0 refills | Status: DC

## 2017-03-10 MED ORDER — AZITHROMYCIN 250 MG PO TABS: ORAL_TABLET | ORAL | 0 refills | Status: AC

## 2017-03-10 MED ORDER — IPRATROPIUM-ALBUTEROL 0.5-2.5 (3) MG/3ML IN SOLN: 3.0000 mL | Freq: Four times a day (QID) | RESPIRATORY_TRACT | 5 refills | Status: DC | PRN

## 2017-03-10 MED ORDER — AZITHROMYCIN 250 MG PO TABS: ORAL_TABLET | ORAL | 0 refills | Status: DC

## 2017-03-10 MED ORDER — ALBUTEROL SULFATE HFA 108 (90 BASE) MCG/ACT IN AERS: 2.0000 | INHALATION_SPRAY | Freq: Four times a day (QID) | RESPIRATORY_TRACT | 3 refills | Status: DC | PRN

## 2017-03-10 MED ORDER — CEFDINIR 300 MG PO CAPS: 300.0000 mg | ORAL_CAPSULE | Freq: Two times a day (BID) | ORAL | 0 refills | Status: DC

## 2017-03-10 MED ORDER — METOLAZONE 5 MG PO TABS
5.0000 mg | ORAL_TABLET | Freq: Every day | ORAL | Status: DC
  Administered 2017-03-10: 5 mg via ORAL
  Filled 2017-03-10: qty 1

## 2017-03-10 MED ORDER — HYDROXYZINE HCL 10 MG PO TABS
10.0000 mg | ORAL_TABLET | Freq: Once | ORAL | Status: AC
  Administered 2017-03-10: 10 mg via ORAL
  Filled 2017-03-10: qty 1

## 2017-03-10 MED ORDER — ALBUTEROL SULFATE (2.5 MG/3ML) 0.083% IN NEBU: 3.0000 mL | INHALATION_SOLUTION | Freq: Four times a day (QID) | RESPIRATORY_TRACT | Status: DC | PRN

## 2017-03-10 NOTE — Progress Notes (Signed)
[facility], [place] 03/10/17  Subjective:   Patient states she still feels that she has fluid. Reports that she is limited mobilty at home. She is unable to get in and out of her home and feels that her legs "give out" on her.  Husband at bedside.   Objective:  Vital signs in last 24 hours:  Temp:  [97.5 F (36.4 C)-98.3 F (36.8 C)] 97.5 F (36.4 C) (01/14 0808) Pulse Rate:  [75-85] 77 (01/14 0808) Resp:  [16-18] 16 (01/14 0808) BP: (126-143)/(65-75) 126/65 (01/14 0808) SpO2:  [94 %-100 %] 94 % (01/14 0808)  Weight change:  Filed Weights   03/09/17 0511  Weight: (!) 177.8 kg (391 lb 14.4 oz)    Intake/Output:    Intake/Output Summary (Last 24 hours) at 03/10/2017 1055 Last data filed at 03/10/2017 0500 Gross per 24 hour  Intake -  Output 1850 ml  Net -1850 ml     Physical Exam: General: Lying on side, head elevated.   HEENT Nasal cannula O2.   Neck Supple   Pulm/lungs Faint wheeze.   CVS/Heart Regular rhythm   Abdomen:  Distended. Tight skin edema.   Extremities: Tight edema 2-3+.   Neurologic: Alert and oriented   Skin: Intact.           Basic Metabolic Panel:  Recent Labs  Lab 03/06/17 0440 03/07/17 0450 03/07/17 1510 03/08/17 0443 03/09/17 0548 03/10/17 0605  NA 136 136  --  134* 135 134*  K 4.4 4.2  --  3.8 3.6 3.7  CL 96* 95*  --  94* 92* 91*  CO2 29 29  --  30 30 31   GLUCOSE 143* 147*  --  133* 136* 150*  BUN 37* 41*  --  41* 42* 42*  CREATININE 2.56* 2.68*  --  2.72* 2.76* 2.85*  CALCIUM 8.2* 8.5*  --  8.3* 8.5* 8.5*  MG 1.5*  --  1.6*  --   --   --      CBC: Recent Labs  Lab 03/03/17 1114 03/09/17 0548  WBC 7.4 10.4  NEUTROABS 5.8  --   HGB 9.5* 9.9*  HCT 30.3* 31.4*  MCV 84.8 85.1  PLT 380 369     No results found for: HEPBSAG, HEPBSAB, HEPBIGM    Microbiology:  Recent Results (from the past 240 hour(s))  MRSA PCR Screening     Status: Abnormal   Collection Time: 03/04/17  9:24 AM   Result Value Ref Range Status   MRSA by PCR POSITIVE (A) NEGATIVE Final    Comment:        The GeneXpert MRSA Assay (FDA approved for NASAL specimens only), is one component of a comprehensive MRSA colonization surveillance program. It is not intended to diagnose MRSA infection nor to guide or monitor treatment for MRSA infections. CRITICAL RESULT CALLED TO, READ BACK BY AND VERIFIED WITH: JESSICA CHRISTMAS 03/04/17 1141 KLW Performed at [facility], [address] [place], [place] [address]     Coagulation Studies: No results for input(s): LABPROT, INR in the last 72 hours.  Urinalysis: No results for input(s): COLORURINE, LABSPEC, PHURINE, GLUCOSEU, HGBUR, BILIRUBINUR, KETONESUR, PROTEINUR, UROBILINOGEN, NITRITE, LEUKOCYTESUR in the last 72 hours.  Invalid input(s): APPERANCEUR    Imaging: No results found.   Medications:   . piperacillin-tazobactam 3.375 g (03/10/17 0600)   . aspirin EC  81 mg Oral Daily  . atorvastatin  80 mg Oral q1800  . cholecalciferol  4,000 Units Oral Weekly  .  citalopram  40 mg Oral Daily  . cloNIDine  0.2 mg Oral BID  . docusate sodium  200 mg Oral BID  . guaiFENesin  600 mg Oral BID  . heparin  5,000 Units Subcutaneous Q8H  . hydrALAZINE  100 mg Oral Q8H  . insulin aspart  0-9 Units Subcutaneous TID WC  . magnesium oxide  800 mg Oral Daily  . metolazone  5 mg Oral Daily  . metoprolol tartrate  100 mg Oral BID  . potassium chloride  20 mEq Oral Daily  . sodium chloride flush  3 mL Intravenous Q12H  . torsemide  100 mg Oral BID   acetaminophen **OR** acetaminophen, albuterol, bisacodyl, diphenhydrAMINE, hydrALAZINE, ipratropium-albuterol, labetalol, ondansetron **OR** ondansetron (ZOFRAN) IV, polyethylene glycol, polyethylene glycol, traMADol  Assessment/ Plan:  47 y.o. female with morbid obesity, insulin-dependent diabetes mellitus type II, anemia, diastolic congestive heart failure EF 55% , COPD/asthma,  hypertension.  1.  Acute Renal failure on CKD st 4 with generalized edema .  Continue torsemide. Low salt diet. Encouraged PT exercises and leg elevation.   GFR 22. Creatinine 2.85 Outpatient follow up.   2. Acute Diastolic HF exacerbation. Blood pressure currently within acceptable range. Echo EF 55%  3. Diabetes type II with CKD. Moderate control with A1c 7.3%.  Not on ACE-I at present because of aggressive diuresis  4. Nephrotic syndrome with anasarca and hypoalbuminemia: 6.9grams of proteinuria on 09/12/2016 Results for Litz, DANYETTA GILLHAM (MRN [id number]) as of 03/10/2017 11:01  Ref. Range 12/15/2014 09:10 08/09/2015 19:19 06/26/2016 03:58 06/27/2016 00:06 11/09/2016 05:57 03/05/2017 03:49  Hemoglobin A1C Latest Ref Range: 4.8 - 5.6 % 13.2 (H) 10.7 (H) 8.2 (H) 8.5 (H) 7.2 (H) 7.3 (H)   Proteinuria and Nephrotic syndrome are presumed to be secondary to Diabetes         LOS: 7 Mason Burleigh 1/14/201910:55 [place], [place]

## 2017-03-10 NOTE — Care Management (Signed)
For discharge home.  To travel by ems.  Husband verbalizes understanding to be at the residence to accept patient.  [facility] applications have not been completed. Patient and husband are able to read. Confirmed that patient does have home nebulizer.  Added SN PT OT SW to home health with [facility]. [facility] to [facility].  Scripts have been faxed

## 2017-03-10 NOTE — Plan of Care (Signed)
  Progressing Education: Knowledge of General Education information will improve 03/10/2017 1105 - Progressing by Darrelyn Hillock, RN Health Behavior/Discharge Planning: Ability to manage health-related needs will improve 03/10/2017 1105 - Progressing by Darrelyn Hillock, RN Clinical Measurements: Ability to maintain clinical measurements within normal limits will improve 03/10/2017 1105 - Progressing by Darrelyn Hillock, RN Will remain free from infection 03/10/2017 1105 - Progressing by Darrelyn Hillock, RN Diagnostic test results will improve 03/10/2017 1105 - Progressing by Darrelyn Hillock, RN Respiratory complications will improve 03/10/2017 1105 - Progressing by Darrelyn Hillock, RN Cardiovascular complication will be avoided 03/10/2017 1105 - Progressing by Darrelyn Hillock, RN Activity: Risk for activity intolerance will decrease 03/10/2017 1105 - Progressing by Darrelyn Hillock, RN Nutrition: Adequate nutrition will be maintained 03/10/2017 1105 - Progressing by Darrelyn Hillock, RN Coping: Level of anxiety will decrease 03/10/2017 1105 - Progressing by Darrelyn Hillock, RN Elimination: Will not experience complications related to bowel motility 03/10/2017 1105 - Progressing by Darrelyn Hillock, RN Will not experience complications related to urinary retention 03/10/2017 1105 - Progressing by Darrelyn Hillock, RN Pain Managment: General experience of comfort will improve 03/10/2017 1105 - Progressing by Darrelyn Hillock, RN Safety: Ability to remain free from injury will improve 03/10/2017 1105 - Progressing by Darrelyn Hillock, RN Skin Integrity: Risk for impaired skin integrity will decrease 03/10/2017 1105 - Progressing by Darrelyn Hillock, RN Spiritual Needs Ability to function at adequate level 03/10/2017 1105 - Progressing by Darrelyn Hillock,  RN Education: Ability to demonstrate management of disease process will improve 03/10/2017 1105 - Progressing by Darrelyn Hillock, RN Ability to verbalize understanding of medication therapies will improve 03/10/2017 1105 - Progressing by Darrelyn Hillock, RN Activity: Capacity to carry out activities will improve 03/10/2017 1105 - Progressing by Darrelyn Hillock, RN Cardiac: Ability to achieve and maintain adequate cardiopulmonary perfusion will improve 03/10/2017 1105 - Progressing by Darrelyn Hillock, RN

## 2017-03-10 NOTE — Progress Notes (Signed)
Progress Note  Patient Name: AVERIE HORNBAKER Date of Encounter: 03/10/2017  Primary Cardiologist: Rockey Situ  Subjective   She reports improvement in symptoms but she continues to have significant orthopnea leg edema.  She is currently on oral torsemide.  Gradual worsening of renal function.  Inpatient Medications    Scheduled Meds: . aspirin EC  81 mg Oral Daily  . atorvastatin  80 mg Oral q1800  . cholecalciferol  4,000 Units Oral Weekly  . citalopram  40 mg Oral Daily  . cloNIDine  0.2 mg Oral BID  . docusate sodium  200 mg Oral BID  . guaiFENesin  600 mg Oral BID  . heparin  5,000 Units Subcutaneous Q8H  . hydrALAZINE  100 mg Oral Q8H  . insulin aspart  0-9 Units Subcutaneous TID WC  . magnesium oxide  800 mg Oral Daily  . metolazone  5 mg Oral Daily  . metoprolol tartrate  100 mg Oral BID  . potassium chloride  20 mEq Oral Daily  . sodium chloride flush  3 mL Intravenous Q12H  . torsemide  100 mg Oral BID   Continuous Infusions: . piperacillin-tazobactam 3.375 g (03/10/17 0600)   PRN Meds: acetaminophen **OR** acetaminophen, albuterol, bisacodyl, diphenhydrAMINE, hydrALAZINE, ipratropium-albuterol, labetalol, ondansetron **OR** ondansetron (ZOFRAN) IV, polyethylene glycol, polyethylene glycol, traMADol   Vital Signs    Vitals:   03/09/17 1806 03/09/17 1940 03/10/17 0523 03/10/17 0808  BP: 136/75 133/74 (!) 143/75 126/65  Pulse: 85 84 75 77  Resp:  16 18 16   Temp: 98.3 F (36.8 C) 98.1 F (36.7 C) 98 F (36.7 C) (!) 97.5 F (36.4 C)  TempSrc: Oral Oral Oral Oral  SpO2: 99% 100% 98% 94%  Weight:      Height:        Intake/Output Summary (Last 24 hours) at 03/10/2017 0947 Last data filed at 03/10/2017 0500 Gross per 24 hour  Intake -  Output 1850 ml  Net -1850 ml   Filed Weights   03/09/17 0511  Weight: (!) 391 lb 14.4 oz (177.8 kg)    Telemetry    NSR - Personally Reviewed  ECG    n/a - Personally Reviewed  Physical Exam   GEN: No acute  distress.  Morbidly obese Neck: JVD difficult to assess 2/2 body habitus. Cardiac: RRR, no murmurs, rubs, or gallops.  Respiratory:  Diminished breath sounds bilaterally with no crackles. GI: Soft, nontender, non-distended.   MS: 1+ chronic woody edema to the thighs; sacral edema; No deformity. Neuro:  Alert and oriented x 3; Nonfocal.  Psych: Normal affect.  Labs    Chemistry Recent Labs  Lab 03/03/17 1114  03/08/17 0443 03/09/17 0548 03/10/17 0605  NA 135   < > 134* 135 134*  K 4.1   < > 3.8 3.6 3.7  CL 98*   < > 94* 92* 91*  CO2 31   < > 30 30 31   GLUCOSE 168*   < > 133* 136* 150*  BUN 28*   < > 41* 42* 42*  CREATININE 2.14*   < > 2.72* 2.76* 2.85*  CALCIUM 7.7*   < > 8.3* 8.5* 8.5*  PROT 6.3*  --   --   --   --   ALBUMIN 2.3*  --   --   --   --   AST 26  --   --   --   --   ALT 16  --   --   --   --  ALKPHOS 297*  --   --   --   --   BILITOT 0.6  --   --   --   --   GFRNONAA 27*   < > 20* 20* 19*  GFRAA 31*   < > 23* 23* 22*  ANIONGAP 6   < > 10 13 12    < > = values in this interval not displayed.     Hematology Recent Labs  Lab 03/03/17 1114 03/09/17 0548  WBC 7.4 10.4  RBC 3.58* 3.69*  HGB 9.5* 9.9*  HCT 30.3* 31.4*  MCV 84.8 85.1  MCH 26.6 26.9  MCHC 31.4* 31.6*  RDW 16.1* 15.8*  PLT 380 369    Cardiac Enzymes Recent Labs  Lab 03/03/17 1114  TROPONINI <0.03   No results for input(s): TROPIPOC in the last 168 hours.   BNP Recent Labs  Lab 03/03/17 1114  BNP 617.0*     DDimer No results for input(s): DDIMER in the last 168 hours.   Radiology    No results found.  Cardiac Studies   TTE 10/2016: Study Conclusions  - Left ventricle: The cavity size was mildly dilated. Systolic function was normal. The estimated ejection fraction was 55%. Wall motion was normal; there were no regional wall motion abnormalities. Doppler parameters are consistent with abnormal left ventricular relaxation (grade 1 diastolic dysfunction). -  Mitral valve: There was mild regurgitation. - Atrial septum: No defect or patent foramen ovale was identified.  Impressions:  - Normal LVEF with mild to moderate MR and mild diastolic dysfunction.  Patient Profile     47 y.o. female with history of chronic diastolic heart failure, chronic kidney disease stage III, COPD/asthma, IDDM, anemia of chronic disease, and morbid obesity possible OHSwho was admitted with acute on chronic diastolic CHF.   Assessment & Plan    1. Acute on chronic diastolic CHF: Difficult situation: Multifactorial Morbid obesity, obesity hypoventilation syndrome, renal failure, upper respiratory tract infection  Possible underlying sleep apnea currently not on CPAP. Diuresis has been difficult due to chronic kidney disease and severe proteinuria.  Gradual decline in renal function. The patient was switched to oral torsemide 100 mg twice daily.  Continue same management. Her weight is down 12 pounds since admission and we might not be able to achieve aggressive diuresis without compromising renal function.  2. Acute on CKD stage III:  Followed by nephrology Gradual worsening of renal function.  3.  Respiratory distress Multifactorial including morbid obesity with obesity hypoventilation,  Upper respiratory tract infection with a productive green brown sputum Now on broad-spectrum antibiotics  4. HTN: Controlled on current medications.  5.  Morbid obesity: Significantly contributing to most of her medical conditions.  The patient benefits from weight loss surgery but she lacks health insurance at the present time.  The patient needs aggressive physical therapy.  Overall a difficult situation.   For questions or updates, please contact [place] Please consult [email or web address] for contact info under Cardiology/STEMI.    Signed, Signed, Kathlyn Sacramento, MD [facility]

## 2017-03-10 NOTE — Progress Notes (Signed)
Provided patient with "Living Better with Heart Failure" packet. Briefly reviewed definition of heart failure and signs and symptoms of an exacerbation. Reviewed importance of and reason behind checking weight daily in the AM, after using the bathroom, but before getting dressed. Discussed when to call the Dr= weight gain of >2lb overnight of 5lb in a week,  Discussed yellow zone= call MD: weight gain of >2lb overnight of 5lb in a week, increased swelling, increased SOB when lying down, chest discomfort, dizziness, increased fatigue Red Zone= call 911: struggle to breath, fainting or near fainting, significant chest pain Reviewed low sodium diet <2g/day-provided handout of recommended and not recommended foods  Fluid restriction <2L/day Reviewed how to read nutrition label Reviewed medication (torsemide, losartan, metoprolol, hydralazine) Explained briefly why pt is on the medications (either make you feel better, live longer or keep you out of the hospital) and discussed monitoring and side effects  Discussed exercise: Start small, 5 minutes, work in walking with errands, etc.

## 2017-03-10 NOTE — Progress Notes (Deleted)
Patient ID: [name], female    DOB: [birth year], 47 y.o.   MRN: [id number]  HPI  [name] is a 47 y/o female with a history of DM, iron deficiency anemia, HTN, COPD with chronic oxygen use, asthma, obesity and chronic heart failure.   Echo report from 11/12/16 showed an EF of 55% along with mild MR. Reviewed echo done 06/27/16 which showed an EF of 60-65%. Previous echo was done 12/15/14 and showed an EF of 50-55% without any valvular regurgitation.   Admitted 03/03/17 due to acute on chronic HF. Cardiology & nephrology consults were obtained. Initially needed IV diuretics and then furosemide drip before transitioning to oral diuretics. Discharged home after 7 days.Admitted 01/07/17 due to pneumonia and pleural effusion. Treated with antibiotics and nebulizers. Elevated troponin thought to be due to demand ischemia. Discharged after 3 days. Admitted 11/07/16 due to hypoxic respiratory failure due to HF. Diuresed ~ 13L while hospitalized. Cardiology consult was obtained. Elevated troponin thought to be due to demand ischemia. Discharged home after 9 days.   She presents today for a follow-up visit with a chief complaint of    Past Medical History:  Diagnosis Date  . (HFpEF) heart failure with preserved ejection fraction ([place])    a. 11/2014 Echo: EF 50-55%, Gr1 DD; b. 06/2016 Echo: EF 60-65%, no rwma, mod dil LA, nl RV, triv eff.  . Asthma   . CKD (chronic kidney disease), stage III ([place])   . COPD (chronic obstructive pulmonary disease) ([place])    a. Home O2.  . Diabetes mellitus without complication ([place])   . Hypertension   . Iron deficiency anemia    a. chronic blood loss->heavy menses.  . Morbid obesity ([place])   . Pericardial effusion    a. 11/2014 CT Chest: Mod effusion; b. 11/2014 Echo: small to mod circumferential effusion. No hemodynamic compromise.  [name] Poorly controlled diabetes mellitus ([place])    a. 11/2014 A1c 13.2.   Past Surgical History:  Procedure Laterality Date  . CESAREAN  SECTION     times 3   Family History  Problem Relation Age of Onset  . Diabetes Mother   . Hypertension Mother   . Hypertension Father   . Diabetes Father   . Hypertension Unknown   . Diabetes Unknown   . Breast cancer Maternal Aunt 1  . Breast cancer Maternal Aunt 87   Social History   Tobacco Use  . Smoking status: Never Smoker  . Smokeless tobacco: Never Used  Substance Use Topics  . Alcohol use: Yes    Alcohol/week: 0.0 oz    Comment: occ drink - <1/wk   Allergies  Allergen Reactions  . Dust Mite Extract Shortness Of Breath and Itching  . Mold Extract [Trichophyton] Shortness Of Breath and Itching  . Feraheme [Ferumoxytol] Itching  . Pollen Extract   . Sulfa Antibiotics Itching     Review of Systems  Constitutional: Positive for appetite change (decreased) and fatigue.  HENT: Negative for congestion, postnasal drip and sinus pressure.   Eyes: Negative.   Respiratory: Positive for cough, shortness of breath and wheezing. Negative for chest tightness.   Cardiovascular: Negative for chest pain, palpitations and leg swelling.  Gastrointestinal: Negative for abdominal distention and abdominal pain.  Endocrine: Negative.   Genitourinary: Negative.   Musculoskeletal: Negative for back pain and neck pain.  Skin: Negative.   Allergic/Immunologic: Negative.   Neurological: Positive for weakness (improving) and light-headedness. Negative for dizziness.  Hematological: Negative for adenopathy. Does not  bruise/bleed easily.  Psychiatric/Behavioral: Positive for dysphoric mood. Negative for sleep disturbance and suicidal ideas. The patient is nervous/anxious.       Physical Exam  Constitutional: She is oriented to person, place, and time. She appears well-developed and well-nourished.  HENT:  Head: Normocephalic and atraumatic.  Neck: Normal range of motion. Neck supple. No JVD present.  Cardiovascular: Normal rate and regular rhythm.  Pulmonary/Chest: Effort normal.  She has no wheezes. She has no rales.  Abdominal: She exhibits no distension. There is no tenderness.  Musculoskeletal: She exhibits edema (1+ edema around bilateral ankles). She exhibits no tenderness.  Neurological: She is alert and oriented to person, place, and time.  Skin: Skin is warm and dry.  Psychiatric: She has a normal mood and affect. Her behavior is normal. Thought content normal.  Nursing note and vitals reviewed.   Assessment & Plan:  1: Chronic heart failure with preserved ejection fraction- - NYHA class III - euvolemic today - patient says that her stated weight is 328 pounds and she refuses to get on the scale today as she says that she feels too tired - continue daily torsemide -  - not adding salt and trying to follow a 2000mg  sodium diet - says that she's drinking ~ 1.5 hospital cups of fluid daily - encouraged increased activty - last saw cardiologist Rockey Situ) 12/25/15.  - does not meet ReDS vest criteria due to BMI - reports receiving her flu vaccine for this season already - admits that she skips her torsemide "sometimes" because she's worried about her kidney function. Discussed the importance of taking the diuretic as prescribed to keep the fluid off and to help with her BP - BNP 03/03/17 was 617.0  2: COPD- - saw pulmonologist Ashby Dawes) 01/20/17 - wears oxygen on a continuous setting of 4L around the clock - patient sleepy today at times but wakes easily; has had issues in the past with elevated carbon dioxide levels -   3: HTN- - BP slightly elevated today but she's been skipping doses of torsemide - BMP on 03/10/17 reviewed and showed sodium 134, potassium 3.7 and GFR 22 - saw PCP on  - due to see nephrologist ([place])  4: Anemia- - saw hematologist Grayland Ormond) 08/22/16 and received a dose of feraheme - reports "all over" joint/muscle pain - patient says that she feels like she needs an iron infusion and now has charity care through [facility];  encouraged her and her husband to call about an appointment  Patient did not bring her medications nor a list. Each medication was verbally reviewed with the patient and she was encouraged to bring the bottles to every visit to confirm accuracy of list.

## 2017-03-10 NOTE — Progress Notes (Signed)
Patient is discharge home in a stable condition summary and f/u care given , verbalized understanding . Left by EMS

## 2017-03-10 NOTE — Clinical Social Work Note (Signed)
CSW received referral for SNF.  Case discussed with case manager and plan is to discharge home with home health.  CSW to sign off please re-consult if social work needs arise.  Valerya Maxton R. Genevia Bouldin, MSW, LCSWA [phone]  

## 2017-03-10 NOTE — Discharge Summary (Signed)
[facility] [place] NAME: [name]    MR#:  [id number]  DATE OF BIRTH:  1970-10-05  DATE OF ADMISSION:  03/03/2017 ADMITTING PHYSICIAN: Fritzi Mandes, MD  DATE OF DISCHARGE: No discharge date for patient encounter.  PRIMARY CARE PHYSICIAN: [name], NP    ADMISSION DIAGNOSIS:  Dyspnea, unspecified type [R06.00]  DISCHARGE DIAGNOSIS:  Principal Problem:   Acute on chronic diastolic heart failure (HCC) Active Problems:   Diabetes ([place])   Morbid obesity ([place])   Poorly controlled type 2 diabetes mellitus (HCC)   CKD (chronic kidney disease), stage III (HCC)   COPD with chronic bronchitis (HCC)   Depression   CHF (congestive heart failure) (HCC)   Obesity hypoventilation syndrome ([place])   SECONDARY DIAGNOSIS:   Past Medical History:  Diagnosis Date  . (HFpEF) heart failure with preserved ejection fraction ([place])    a. 11/2014 Echo: EF 50-55%, Gr1 DD; b. 06/2016 Echo: EF 60-65%, no rwma, mod dil LA, nl RV, triv eff.  . Asthma   . CKD (chronic kidney disease), stage III ([place])   . COPD (chronic obstructive pulmonary disease) ([place])    a. Home O2.  . Diabetes mellitus without complication ([place])   . Hypertension   . Iron deficiency anemia    a. chronic blood loss->heavy menses.  . Morbid obesity ([place])   . Pericardial effusion    a. 11/2014 CT Chest: Mod effusion; b. 11/2014 Echo: small to mod circumferential effusion. No hemodynamic compromise.  [name] Poorly controlled diabetes mellitus ([place])    a. 11/2014 A1c 13.2.    HOSPITAL COURSE:   [name] a47 y.o.femalewith a known history of morbid obesity, diastolic congestive heart failure, chronic, hypertension, CKD stage III, type 2 diabetes comes to the emergency room with increasing shortness of breath and weight gain which patient tells me she is not fitting into her clothes and shoes. Patient does not remember her dry weight.  1 Acute on chronic diastolic  congestive heart failure, LV EF: 55% Resolved Most likely has element of cardiorenal syndrome Cardiology and nephrology did see patient while in house  Placed on our CHFprotocol,IV Lasix converted to  torsemide twice daily, Zaroxolyn for short course,  and patient did well   2acute HCAP Placed on our pneumonia protocol, treated with empiric Zosyn, BTs, and IS  3acute urinary retention Resolved Foley removed March 08, 2017   4 DM 2, controlled Stable ContinueSSI  5chronic extreme morbid obesity Most likely secondary to excess calories Lifestyle modification recommended Patient states she was evaluated for sleep apnea in the last 1-3 years-tests were negative/did not require CPAP, noted ABG for hypercarbia with pH of 7.3-patient without insurance, should undergo repeat sleep study for possible need for CPAP-we will defer to primary care provider/appointment made in 2-3 days for reevaluation Bariatric surgery would be ideal  6.ARF onCKD stage III BL Cr 2.2 11/18 Creatinine slightly increasedto 2.8 Losartan held while in house Nephrology did follow patient while in house  7anemia of chronic disease Stable  8acute on chronic hypoxic respiratory failure Resolved Secondary to congestive heart failure DuoNeb's 4 times daily, currently atbaseline 4 L via nasal cannula continuous   DISCHARGE CONDITIONS:   On day of discharge patient is afebrile, stable, tolerating, ready for discharge home with appropriate follow-up and congestive heart failure clinic as well as with primary care provider for reevaluation  CONSULTS OBTAINED:  Treatment Team:  Minna Merritts, MD Wellington Hampshire, MD Lavonia Dana,  MD  DRUG ALLERGIES:   Allergies  Allergen Reactions  . Dust Mite Extract Shortness Of Breath and Itching  . Mold Extract [Trichophyton] Shortness Of Breath and Itching  . Feraheme [Ferumoxytol] Itching  . Pollen Extract   . Sulfa Antibiotics Itching     DISCHARGE MEDICATIONS:   Allergies as of 03/10/2017      Reactions   Dust Mite Extract Shortness Of Breath, Itching   Mold Extract [trichophyton] Shortness Of Breath, Itching   Feraheme [ferumoxytol] Itching   Pollen Extract    Sulfa Antibiotics Itching      Medication List    STOP taking these medications   predniSONE 10 MG (21) Tbpk tablet Commonly known as:  STERAPRED UNI-PAK 21 TAB     TAKE these medications   acetaminophen 500 MG tablet Commonly known as:  TYLENOL Take 1,000 mg by mouth every 6 (six) hours as needed for mild pain, fever or headache.   albuterol (2.5 MG/3ML) 0.083% nebulizer solution Commonly known as:  PROVENTIL Take 2.5 mg by nebulization every 6 (six) hours as needed for wheezing or shortness of breath.   albuterol 108 (90 Base) MCG/ACT inhaler Commonly known as:  PROVENTIL HFA;VENTOLIN HFA Inhale 2 puffs into the lungs every 6 (six) hours as needed for wheezing or shortness of breath.   aspirin EC 81 MG tablet Take 81 mg by mouth daily.   atorvastatin 80 MG tablet Commonly known as:  LIPITOR Take 1 tablet (80 mg total) by mouth daily at 6 PM.   azithromycin 250 MG tablet Commonly known as:  ZITHROMAX Z-PAK Take 2 tablets (500 mg) on  Day 1,  followed by 1 tablet (250 mg) once daily on Days 2 through 5.   cefdinir 300 MG capsule Commonly known as:  OMNICEF Take 1 capsule (300 mg total) by mouth 2 (two) times daily.   citalopram 40 MG tablet Commonly known as:  CELEXA Take 1 tablet (40 mg total) by mouth daily.   cloNIDine 0.2 MG tablet Commonly known as:  CATAPRES Take 1 tablet (0.2 mg total) by mouth 2 (two) times daily.   guaiFENesin-dextromethorphan 100-10 MG/5ML syrup Commonly known as:  ROBITUSSIN DM Take 5 mLs every 4 (four) hours as needed by mouth for cough.   HM VITAMIN D3 4000 units Caps Generic drug:  Cholecalciferol Take 1 capsule by mouth once a week.   hydrALAZINE 100 MG tablet Commonly known as:   APRESOLINE Take 1 tablet (100 mg total) by mouth every 8 (eight) hours.   insulin aspart 100 UNIT/ML FlexPen Commonly known as:  NOVOLOG FLEXPEN Inject 0-12 Units into the skin 3 (three) times daily with meals as needed for high blood sugar. Pt uses as needed per sliding scale.   ipratropium-albuterol 0.5-2.5 (3) MG/3ML Soln Commonly known as:  DUONEB Take 3 mLs by nebulization every 6 (six) hours as needed.   losartan 50 MG tablet Commonly known as:  COZAAR Take 50 mg daily by mouth.   metoprolol tartrate 100 MG tablet Commonly known as:  LOPRESSOR Take 1 tablet (100 mg total) by mouth 2 (two) times daily.   polyethylene glycol packet Commonly known as:  MIRALAX / GLYCOLAX Take 17 g by mouth daily as needed for mild constipation.   potassium chloride 10 MEQ tablet Commonly known as:  K-DUR Take 1 tablet (10 mEq total) by mouth 2 (two) times daily.   torsemide 100 MG tablet Commonly known as:  DEMADEX Take 1 tablet (100 mg total) by mouth 2 (two)  times daily. What changed:    medication strength  how much to take  when to take this        DISCHARGE INSTRUCTIONS:    If you experience worsening of your admission symptoms, develop shortness of breath, life threatening emergency, suicidal or homicidal thoughts you must seek medical attention immediately by calling 911 or calling your MD immediately  if symptoms less severe.  You Must read complete instructions/literature along with all the possible adverse reactions/side effects for all the Medicines you take and that have been prescribed to you. Take any new Medicines after you have completely understood and accept all the possible adverse reactions/side effects.   Please note  You were cared for by a hospitalist during your hospital stay. If you have any questions about your discharge medications or the care you received while you were in the hospital after you are discharged, you can call the unit and asked to speak  with the hospitalist on call if the hospitalist that took care of you is not available. Once you are discharged, your primary care physician will handle any further medical issues. Please note that NO REFILLS for any discharge medications will be authorized once you are discharged, as it is imperative that you return to your primary care physician (or establish a relationship with a primary care physician if you do not have one) for your aftercare needs so that they can reassess your need for medications and monitor your lab values.    Today   CHIEF COMPLAINT:   Chief Complaint  Patient presents with  . Shortness of Breath    HISTORY OF PRESENT ILLNESS:   [name]  is a 47 y.o. female with a known history of morbid obesity, diastolic congestive heart failure, chronic, hypertension, CKD stage III, type 2 diabetes comes to the emergency room with increasing shortness of breath and weight gain which patient tells me she is not fitting into her clothes and shoes.  Patient does not remember her dry weight. Workup in the ER showed patient is in congestive heart failure acute on chronic diastolic chest x-ray shows pulmonary venous congestion and BNP is elevated to 671.  She received IV Lasix she has been admitted for further rash management.    VITAL SIGNS:  Blood pressure 126/65, pulse 77, temperature (!) 97.5 F (36.4 C), temperature source Oral, resp. rate 16, height 5\' 4"  (1.626 m), weight (!) 177.8 kg (391 lb 14.4 oz), last menstrual period 10/14/2012, SpO2 94 %.  I/O:    Intake/Output Summary (Last 24 hours) at 03/10/2017 1038 Last data filed at 03/10/2017 0500 Gross per 24 hour  Intake -  Output 1850 ml  Net -1850 ml    PHYSICAL EXAMINATION:  GENERAL:  47 y.o.-year-old patient lying in the bed with no acute distress.  EYES: Pupils equal, round, reactive to light and accommodation. No scleral icterus. Extraocular muscles intact.  HEENT: Head atraumatic, normocephalic.  Oropharynx and nasopharynx clear.  NECK:  Supple, no jugular venous distention. No thyroid enlargement, no tenderness.  LUNGS: Normal breath sounds bilaterally, no wheezing, rales,rhonchi or crepitation. No use of accessory muscles of respiration.  CARDIOVASCULAR: S1, S2 normal. No murmurs, rubs, or gallops.  ABDOMEN: Soft, non-tender, non-distended. Bowel sounds present. No organomegaly or mass.  EXTREMITIES: No pedal edema, cyanosis, or clubbing.  NEUROLOGIC: Cranial nerves II through XII are intact. Muscle strength 5/5 in all extremities. Sensation intact. Gait not checked.  PSYCHIATRIC: The patient is alert and oriented x 3.  SKIN:  No obvious rash, lesion, or ulcer.   DATA REVIEW:   CBC Recent Labs  Lab 03/09/17 0548  WBC 10.4  HGB 9.9*  HCT 31.4*  PLT 369    Chemistries  Recent Labs  Lab 03/03/17 1114  03/07/17 1510  03/10/17 0605  NA 135   < >  --    < > 134*  K 4.1   < >  --    < > 3.7  CL 98*   < >  --    < > 91*  CO2 31   < >  --    < > 31  GLUCOSE 168*   < >  --    < > 150*  BUN 28*   < >  --    < > 42*  CREATININE 2.14*   < >  --    < > 2.85*  CALCIUM 7.7*   < >  --    < > 8.5*  MG  --    < > 1.6*  --   --   AST 26  --   --   --   --   ALT 16  --   --   --   --   ALKPHOS 297*  --   --   --   --   BILITOT 0.6  --   --   --   --    < > = values in this interval not displayed.    Cardiac Enzymes Recent Labs  Lab 03/03/17 1114  TROPONINI <0.03    Microbiology Results  Results for orders placed or performed during the hospital encounter of 03/03/17  MRSA PCR Screening     Status: Abnormal   Collection Time: 03/04/17  9:24 AM  Result Value Ref Range Status   MRSA by PCR POSITIVE (A) NEGATIVE Final    Comment:        The GeneXpert MRSA Assay (FDA approved for NASAL specimens only), is one component of a comprehensive MRSA colonization surveillance program. It is not intended to diagnose MRSA infection nor to guide or monitor treatment for MRSA  infections. CRITICAL RESULT CALLED TO, READ BACK BY AND VERIFIED WITH: JESSICA CHRISTMAS 03/04/17 1141 KLW Performed at [facility], [address] [place], [place] [address]     RADIOLOGY:  No results found.  EKG:   Orders placed or performed during the hospital encounter of 03/03/17  . ED EKG  . ED EKG  . EKG 12-Lead  . EKG 12-Lead  . EKG 12-Lead  . EKG 12-Lead      Management plans discussed with the patient, family and they are in agreement.  CODE STATUS:     Code Status Orders  (From admission, onward)        Start     Ordered   03/03/17 1420  Full code  Continuous     03/03/17 1420    Code Status History    Date Active Date Inactive Code Status Order ID Comments User Context   01/07/2017 17:25 01/10/2017 19:00 Full Code [id number]  Demetrios Loll, MD Inpatient   11/08/2016 04:50 11/16/2016 17:17 Full Code [id number]  Harvie Bridge, DO Inpatient   06/25/2016 14:47 06/29/2016 18:15 Full Code [id number]  Hillary Bow, MD ED   08/09/2015 17:57 08/13/2015 17:21 Full Code [id number]  Bettey Costa, MD Inpatient   12/31/2014 20:25 01/03/2015 17:49 Full Code [id number]  Lytle Butte, MD ED   12/15/2014 11:54 12/19/2014 16:57 Full  Code [id number]  Hillary Bow, MD ED      TOTAL TIME TAKING CARE OF THIS PATIENT: 45 minutes.    Avel Peace Salary M.D on 03/10/2017 at 10:38 AM  Between 7am to 6pm - Pager - [phone]  After 6pm go to [email or web address] - password [email or web address] [facility]  Office  [phone]  CC: Primary care physician; [name], NP   Note: This dictation was prepared with Dragon dictation along with smaller phrase technology. Any transcriptional errors that result from this process are unintentional.

## 2017-03-11 ENCOUNTER — Telehealth: Payer: Self-pay | Admitting: Family

## 2017-03-11 ENCOUNTER — Ambulatory Visit: Payer: Self-pay | Admitting: Family

## 2017-03-11 NOTE — Telephone Encounter (Signed)
Patient did not show for her Heart Failure Clinic appointment on 12/30/17. Will attempt to reschedule.  

## 2017-03-19 ENCOUNTER — Ambulatory Visit: Payer: Self-pay | Admitting: Pharmacy Technician

## 2017-03-19 DIAGNOSIS — Z79899 Other long term (current) drug therapy: Secondary | ICD-10-CM

## 2017-03-20 ENCOUNTER — Telehealth: Payer: Self-pay | Admitting: Licensed Clinical Social Worker

## 2017-03-20 NOTE — Telephone Encounter (Signed)
CSW referred to assist with transportation needs for patient who has an appointment on Monday. Patient is uninsured and unable to ambulate outside of the home. Patient reports she does not have a wheelchair and will need one to get from home to clinic. Patient recently hospitalized and in need of follow up care in HF Clinic at [facility]. CSW discussed case with Gurney Maxin, EMS who will assist with getting patient out of home via new wheelchair provided by HF Clinic and ready for pick up. CSW made arrangements with [employer] who will provide wheelchair transport from home to clinic. Patient verbalizes understanding of plan for transport to clinic appointment. CSW confirmed with [facility] staff the plan for transport. CSW will follow up with Gurney Maxin, EMS post clinic visit. Raquel Sarna, [place], [place]

## 2017-03-21 NOTE — Progress Notes (Signed)
Patient ID: [name], female    DOB: [birth year], 47 y.o.   MRN: [id number]  HPI  [name] is a 47 y/o female with a history of DM, iron deficiency anemia, HTN, COPD with chronic oxygen use, asthma, obesity and chronic heart failure.   Echo report from 11/12/16 showed an EF of 55% along with mild MR. Reviewed echo done 06/27/16 which showed an EF of 60-65%. Previous echo was done 12/15/14 and showed an EF of 50-55% without any valvular regurgitation.   Admitted 03/03/17 due to acute on chronic HF. Cardiology & nephrology consults were obtained. Initially needed IV diuretics and then furosemide drip before transitioning to oral diuretics. Discharged home after 7 days.Admitted 01/07/17 due to pneumonia and pleural effusion. Treated with antibiotics and nebulizers. Elevated troponin thought to be due to demand ischemia. Discharged after 3 days. Admitted 11/07/16 due to hypoxic respiratory failure due to HF. Diuresed ~ 13L while hospitalized. Cardiology consult was obtained. Elevated troponin thought to be due to demand ischemia. Discharged home after 9 days.   She presents today for a follow-up visit with a chief complaint of shortness of breath even at rest. She's had shortness of breath, cough, fatigue, chest pain, edema, abdominal distention, weakness, difficulty sleeping and subjective weight gain. She denies palpitations. She says that she's unable to even get out of the bed and had to be carried down the stairs of her home today because of her inability to walk due to weakness in her legs.   Past Medical History:  Diagnosis Date  . (HFpEF) heart failure with preserved ejection fraction ([place])    a. 11/2014 Echo: EF 50-55%, Gr1 DD; b. 06/2016 Echo: EF 60-65%, no rwma, mod dil LA, nl RV, triv eff.  . Asthma   . CKD (chronic kidney disease), stage III ([place])   . COPD (chronic obstructive pulmonary disease) ([place])    a. Home O2.  . Diabetes mellitus without complication ([place])   . Hypertension   .  Iron deficiency anemia    a. chronic blood loss->heavy menses.  . Morbid obesity ([place])   . Pericardial effusion    a. 11/2014 CT Chest: Mod effusion; b. 11/2014 Echo: small to mod circumferential effusion. No hemodynamic compromise.  [name] Poorly controlled diabetes mellitus ([place])    a. 11/2014 A1c 13.2.   Past Surgical History:  Procedure Laterality Date  . CESAREAN SECTION     times 3   Family History  Problem Relation Age of Onset  . Diabetes Mother   . Hypertension Mother   . Hypertension Father   . Diabetes Father   . Hypertension Unknown   . Diabetes Unknown   . Breast cancer Maternal Aunt 17  . Breast cancer Maternal Aunt 76   Social History   Tobacco Use  . Smoking status: Never Smoker  . Smokeless tobacco: Never Used  Substance Use Topics  . Alcohol use: Yes    Alcohol/week: 0.0 oz    Comment: occ drink - <1/wk   Allergies  Allergen Reactions  . Dust Mite Extract Shortness Of Breath and Itching  . Mold Extract [Trichophyton] Shortness Of Breath and Itching  . Feraheme [Ferumoxytol] Itching  . Pollen Extract   . Sulfa Antibiotics Itching   Past Medical History:  Diagnosis Date  . (HFpEF) heart failure with preserved ejection fraction ([place])    a. 11/2014 Echo: EF 50-55%, Gr1 DD; b. 06/2016 Echo: EF 60-65%, no rwma, mod dil LA, nl RV, triv eff.  . Asthma   .  CHF (congestive heart failure) ([place])   . CKD (chronic kidney disease), stage III ([place])   . COPD (chronic obstructive pulmonary disease) ([place])    a. Home O2.  . Diabetes mellitus without complication ([place])   . Hypertension   . Iron deficiency anemia    a. chronic blood loss->heavy menses.  . Morbid obesity ([place])   . Pericardial effusion    a. 11/2014 CT Chest: Mod effusion; b. 11/2014 Echo: small to mod circumferential effusion. No hemodynamic compromise.  [name] Poorly controlled diabetes mellitus ([place])    a. 11/2014 A1c 13.2.   Past Surgical History:  Procedure Laterality Date  . CESAREAN SECTION      times 3   Family History  Problem Relation Age of Onset  . Diabetes Mother   . Hypertension Mother   . Hypertension Father   . Diabetes Father   . Hypertension Unknown   . Diabetes Unknown   . Breast cancer Maternal Aunt 4  . Breast cancer Maternal Aunt 84   Social History   Tobacco Use  . Smoking status: Never Smoker  . Smokeless tobacco: Never Used  Substance Use Topics  . Alcohol use: Yes    Alcohol/week: 0.0 oz    Comment: occ drink - <1/wk   Allergies  Allergen Reactions  . Dust Mite Extract Shortness Of Breath and Itching  . Mold Extract [Trichophyton] Shortness Of Breath and Itching  . Feraheme [Ferumoxytol] Itching  . Pollen Extract   . Sulfa Antibiotics Itching   Prior to Admission medications   Medication Sig Start Date End Date Taking? Authorizing Provider  acetaminophen (TYLENOL) 500 MG tablet Take 1,000 mg by mouth every 6 (six) hours as needed for mild pain, fever or headache.   Yes [provider]  albuterol (PROVENTIL HFA;VENTOLIN HFA) 108 (90 Base) MCG/ACT inhaler Inhale 2 puffs into the lungs every 6 (six) hours as needed for wheezing or shortness of breath. 03/10/17  Yes Salary, Holly Bodily D, MD  albuterol (PROVENTIL) (2.5 MG/3ML) 0.083% nebulizer solution Take 2.5 mg by nebulization every 6 (six) hours as needed for wheezing or shortness of breath.   Yes [provider]  aspirin EC 81 MG tablet Take 81 mg by mouth daily.    Yes [provider]  atorvastatin (LIPITOR) 80 MG tablet Take 1 tablet (80 mg total) by mouth daily at 6 PM. 11/16/16  Yes Mody, Sital, MD  cefdinir (OMNICEF) 300 MG capsule Take 1 capsule (300 mg total) by mouth 2 (two) times daily. 03/10/17  Yes Salary, Avel Peace, MD  Cholecalciferol (HM VITAMIN D3) 4000 units CAPS Take 1 capsule by mouth once a week.   Yes [provider]  citalopram (CELEXA) 40 MG tablet Take 1 tablet (40 mg total) by mouth daily. 11/16/16  Yes Mody, Ulice Bold, MD  cloNIDine (CATAPRES) 0.2  MG tablet Take 1 tablet (0.2 mg total) by mouth 2 (two) times daily. 01/23/17  Yes Kingstyn Deruiter A, FNP  guaiFENesin-dextromethorphan (ROBITUSSIN DM) 100-10 MG/5ML syrup Take 5 mLs every 4 (four) hours as needed by mouth for cough. 01/10/17  Yes Demetrios Loll, MD  hydrALAZINE (APRESOLINE) 100 MG tablet Take 1 tablet (100 mg total) by mouth every 8 (eight) hours. 11/16/16  Yes Mody, Ulice Bold, MD  insulin aspart (NOVOLOG FLEXPEN) 100 UNIT/ML FlexPen Inject 0-12 Units into the skin 3 (three) times daily with meals as needed for high blood sugar. Pt uses as needed per sliding scale. 11/16/16  Yes Mody, Ulice Bold, MD  ipratropium-albuterol (DUONEB) 0.5-2.5 (3)  MG/3ML SOLN Take 3 mLs by nebulization every 6 (six) hours as needed. 03/10/17  Yes Salary, Avel Peace, MD  losartan (COZAAR) 50 MG tablet Take 50 mg daily by mouth.   Yes [provider]  metoprolol tartrate (LOPRESSOR) 100 MG tablet Take 1 tablet (100 mg total) by mouth 2 (two) times daily. 11/16/16  Yes Mody, Ulice Bold, MD  polyethylene glycol (MIRALAX / GLYCOLAX) packet Take 17 g by mouth daily as needed for mild constipation. 06/29/16  Yes Vaughan Basta, MD  potassium chloride (K-DUR) 10 MEQ tablet Take 1 tablet (10 mEq total) by mouth 2 (two) times daily. 11/16/16  Yes Bettey Costa, MD  torsemide (DEMADEX) 100 MG tablet Take 1 tablet (100 mg total) by mouth 2 (two) times daily. 03/10/17  Yes Salary, Avel Peace, MD     Review of Systems  Constitutional: Positive for appetite change (decreased) and fatigue.  HENT: Positive for congestion. Negative for postnasal drip and sinus pressure.   Eyes: Negative.   Respiratory: Positive for cough, shortness of breath and wheezing (at times). Negative for chest tightness.   Cardiovascular: Positive for chest pain and leg swelling. Negative for palpitations.  Gastrointestinal: Positive for abdominal distention and abdominal pain (at times).  Endocrine: Negative.   Genitourinary: Negative.   Musculoskeletal:  Positive for neck pain. Negative for back pain.  Skin: Negative.   Allergic/Immunologic: Negative.   Neurological: Positive for weakness (worsening) and light-headedness. Negative for dizziness.  Hematological: Negative for adenopathy. Does not bruise/bleed easily.  Psychiatric/Behavioral: Positive for dysphoric mood and sleep disturbance. Negative for suicidal ideas. The patient is nervous/anxious.    Vitals:   03/24/17 1528  BP: 140/80  Pulse: 91  Resp: 18  SpO2: 95%  Weight: (!) 398 lb (180.5 kg)  Height: 5\' 4"  (1.626 m)   Wt Readings from Last 3 Encounters:  03/24/17 (!) 398 lb (180.5 kg)  03/09/17 (!) 391 lb 14.4 oz (177.8 kg)  01/20/17 (!) 355 lb (161 kg)   Lab Results  Component Value Date   CREATININE 2.85 (H) 03/10/2017   CREATININE 2.76 (H) 03/09/2017   CREATININE 2.72 (H) 03/08/2017    Physical Exam  Constitutional: She is oriented to person, place, and time. She appears well-developed and well-nourished.  HENT:  Head: Normocephalic and atraumatic.  Neck: Normal range of motion. Neck supple. No JVD present.  Cardiovascular: Normal rate and regular rhythm.  Pulmonary/Chest: Effort normal. She has no wheezes. She has no rales.  Difficult to assess due to body girth   Abdominal: She exhibits distension. There is tenderness.  Musculoskeletal: She exhibits edema (3+ edema around bilateral ankles). She exhibits no tenderness.  Neurological: She is alert and oriented to person, place, and time.  Skin: Skin is warm and dry.  Psychiatric: She has a normal mood and affect. Her behavior is normal. Thought content normal.  Nursing note and vitals reviewed.   Assessment & Plan:  1: Acute on Chronic heart failure with preserved ejection fraction- - NYHA class IV - severely fluid overloaded today - patient says that her stated weight is 398 pounds at hospital discharge. She is unable to weigh at home because she can't support herself on her legs - did not attempt to  weigh patient in clinic for safety concerns - taking 100mg  torsemide BID and says that she hasn't missed any doses  - not adding salt and trying to follow a 2000mg  sodium diet; says that her husband is cooking everything fresh - says that she's drinking ~ 1.5  hospital cups of fluid daily - last saw cardiologist Rockey Situ) 12/25/15.  - does not meet ReDS vest criteria due to BMI - reports receiving her flu vaccine for this season already - says that she had home health coming out but that she was discharged last week and says that home health called her PCP to say she wasn't taking her medications; patient says she takes all her medications but may not take them all at one time - BNP 03/03/17 was 617.0 - BMP 03/10/17 shows sodium 134, potassium 3.7 and GFR 22   - discussed various options such as giving metolazone daily for a few days, giving IV lasix outpatient basis, IV lasix at home or admission - unable to get home health back in because she is currently uninsured and is an outpatient - unable to give IV lasix outpatient today because of transportation issues with getting her back home - patient had to have paramedics meet at her home, carry her out of the home and down her steps to get her into the wheelchair and into transportation; not realistic to have them repeat that process multiple times this week - patient says that she's unable to hold herself up on her legs and spends "all the time in the bed". Was going to try metolazone with increase in potassium, however, question how she would even get to the bathroom if she had to be carried out of the house   After discussing options with the patient, social work and nurse navigator in [facility] and Dr. Rockey Situ, will send patient to the ED. CMA wheeled patient to the ED with patient's husband present.  Patient would benefit from SNF placement at discharge for rehab. Discussed this option with patient who says that a few  months ago, she was resistant to this idea but today she would agree to it because she sees that she can't take care of herself at home.   Follow-up will be pending discharge disposition.

## 2017-03-24 ENCOUNTER — Encounter: Payer: Self-pay | Admitting: Intensive Care

## 2017-03-24 ENCOUNTER — Inpatient Hospital Stay
Admission: EM | Admit: 2017-03-24 | Discharge: 2017-04-28 | DRG: 264 | Disposition: A | Discharge status: Skilled Nursing Facility | Payer: Medicaid Other | Attending: Internal Medicine | Admitting: Internal Medicine

## 2017-03-24 ENCOUNTER — Other Ambulatory Visit: Payer: Self-pay

## 2017-03-24 ENCOUNTER — Encounter: Payer: Self-pay | Admitting: Family

## 2017-03-24 ENCOUNTER — Emergency Department: Payer: Medicaid Other

## 2017-03-24 ENCOUNTER — Ambulatory Visit: Payer: Medicaid Other | Admitting: Family

## 2017-03-24 VITALS — BP 140/80 | HR 91 | Resp 18 | Ht 64.0 in | Wt 398.0 lb

## 2017-03-24 DIAGNOSIS — Z794 Long term (current) use of insulin: Secondary | ICD-10-CM | POA: Insufficient documentation

## 2017-03-24 DIAGNOSIS — J449 Chronic obstructive pulmonary disease, unspecified: Secondary | ICD-10-CM | POA: Insufficient documentation

## 2017-03-24 DIAGNOSIS — I509 Heart failure, unspecified: Secondary | ICD-10-CM

## 2017-03-24 DIAGNOSIS — I110 Hypertensive heart disease with heart failure: Secondary | ICD-10-CM | POA: Diagnosis present

## 2017-03-24 DIAGNOSIS — I5033 Acute on chronic diastolic (congestive) heart failure: Secondary | ICD-10-CM | POA: Diagnosis present

## 2017-03-24 DIAGNOSIS — I130 Hypertensive heart and chronic kidney disease with heart failure and stage 1 through stage 4 chronic kidney disease, or unspecified chronic kidney disease: Secondary | ICD-10-CM | POA: Insufficient documentation

## 2017-03-24 DIAGNOSIS — I472 Ventricular tachycardia: Secondary | ICD-10-CM | POA: Diagnosis present

## 2017-03-24 DIAGNOSIS — Z91048 Other nonmedicinal substance allergy status: Secondary | ICD-10-CM

## 2017-03-24 DIAGNOSIS — Z9114 Patient's other noncompliance with medication regimen: Secondary | ICD-10-CM | POA: Diagnosis not present

## 2017-03-24 DIAGNOSIS — E1122 Type 2 diabetes mellitus with diabetic chronic kidney disease: Secondary | ICD-10-CM

## 2017-03-24 DIAGNOSIS — E66813 Obesity, class 3: Secondary | ICD-10-CM

## 2017-03-24 DIAGNOSIS — Z7982 Long term (current) use of aspirin: Secondary | ICD-10-CM

## 2017-03-24 DIAGNOSIS — N183 Chronic kidney disease, stage 3 unspecified: Secondary | ICD-10-CM | POA: Diagnosis present

## 2017-03-24 DIAGNOSIS — G894 Chronic pain syndrome: Secondary | ICD-10-CM

## 2017-03-24 DIAGNOSIS — I132 Hypertensive heart and chronic kidney disease with heart failure and with stage 5 chronic kidney disease, or end stage renal disease: Principal | ICD-10-CM | POA: Diagnosis present

## 2017-03-24 DIAGNOSIS — Z9981 Dependence on supplemental oxygen: Secondary | ICD-10-CM | POA: Insufficient documentation

## 2017-03-24 DIAGNOSIS — Z515 Encounter for palliative care: Secondary | ICD-10-CM

## 2017-03-24 DIAGNOSIS — K5900 Constipation, unspecified: Secondary | ICD-10-CM | POA: Diagnosis not present

## 2017-03-24 DIAGNOSIS — D500 Iron deficiency anemia secondary to blood loss (chronic): Secondary | ICD-10-CM | POA: Diagnosis not present

## 2017-03-24 DIAGNOSIS — I5032 Chronic diastolic (congestive) heart failure: Secondary | ICD-10-CM | POA: Diagnosis present

## 2017-03-24 DIAGNOSIS — R748 Abnormal levels of other serum enzymes: Secondary | ICD-10-CM | POA: Diagnosis not present

## 2017-03-24 DIAGNOSIS — D631 Anemia in chronic kidney disease: Secondary | ICD-10-CM | POA: Diagnosis present

## 2017-03-24 DIAGNOSIS — Z992 Dependence on renal dialysis: Secondary | ICD-10-CM | POA: Diagnosis not present

## 2017-03-24 DIAGNOSIS — Z8249 Family history of ischemic heart disease and other diseases of the circulatory system: Secondary | ICD-10-CM | POA: Insufficient documentation

## 2017-03-24 DIAGNOSIS — N186 End stage renal disease: Secondary | ICD-10-CM

## 2017-03-24 DIAGNOSIS — Z6841 Body Mass Index (BMI) 40.0 and over, adult: Secondary | ICD-10-CM

## 2017-03-24 DIAGNOSIS — Z7189 Other specified counseling: Secondary | ICD-10-CM | POA: Diagnosis not present

## 2017-03-24 DIAGNOSIS — F4323 Adjustment disorder with mixed anxiety and depressed mood: Secondary | ICD-10-CM

## 2017-03-24 DIAGNOSIS — R3915 Urgency of urination: Secondary | ICD-10-CM | POA: Diagnosis not present

## 2017-03-24 DIAGNOSIS — Z803 Family history of malignant neoplasm of breast: Secondary | ICD-10-CM

## 2017-03-24 DIAGNOSIS — D509 Iron deficiency anemia, unspecified: Secondary | ICD-10-CM | POA: Diagnosis present

## 2017-03-24 DIAGNOSIS — Z833 Family history of diabetes mellitus: Secondary | ICD-10-CM

## 2017-03-24 DIAGNOSIS — Z882 Allergy status to sulfonamides status: Secondary | ICD-10-CM | POA: Insufficient documentation

## 2017-03-24 DIAGNOSIS — Z9181 History of falling: Secondary | ICD-10-CM

## 2017-03-24 DIAGNOSIS — E662 Morbid (severe) obesity with alveolar hypoventilation: Secondary | ICD-10-CM | POA: Diagnosis present

## 2017-03-24 DIAGNOSIS — E785 Hyperlipidemia, unspecified: Secondary | ICD-10-CM | POA: Diagnosis present

## 2017-03-24 DIAGNOSIS — N179 Acute kidney failure, unspecified: Secondary | ICD-10-CM | POA: Diagnosis present

## 2017-03-24 DIAGNOSIS — G253 Myoclonus: Secondary | ICD-10-CM | POA: Diagnosis present

## 2017-03-24 DIAGNOSIS — Z79899 Other long term (current) drug therapy: Secondary | ICD-10-CM

## 2017-03-24 DIAGNOSIS — R600 Localized edema: Secondary | ICD-10-CM

## 2017-03-24 DIAGNOSIS — Z888 Allergy status to other drugs, medicaments and biological substances status: Secondary | ICD-10-CM

## 2017-03-24 DIAGNOSIS — E119 Type 2 diabetes mellitus without complications: Secondary | ICD-10-CM | POA: Diagnosis present

## 2017-03-24 DIAGNOSIS — F341 Dysthymic disorder: Secondary | ICD-10-CM

## 2017-03-24 DIAGNOSIS — R0781 Pleurodynia: Secondary | ICD-10-CM | POA: Diagnosis not present

## 2017-03-24 DIAGNOSIS — I248 Other forms of acute ischemic heart disease: Secondary | ICD-10-CM | POA: Diagnosis not present

## 2017-03-24 DIAGNOSIS — E1165 Type 2 diabetes mellitus with hyperglycemia: Secondary | ICD-10-CM | POA: Diagnosis present

## 2017-03-24 DIAGNOSIS — R778 Other specified abnormalities of plasma proteins: Secondary | ICD-10-CM

## 2017-03-24 DIAGNOSIS — I313 Pericardial effusion (noninflammatory): Secondary | ICD-10-CM | POA: Diagnosis present

## 2017-03-24 DIAGNOSIS — J9621 Acute and chronic respiratory failure with hypoxia: Secondary | ICD-10-CM | POA: Diagnosis present

## 2017-03-24 DIAGNOSIS — E46 Unspecified protein-calorie malnutrition: Secondary | ICD-10-CM | POA: Diagnosis present

## 2017-03-24 DIAGNOSIS — I5020 Unspecified systolic (congestive) heart failure: Secondary | ICD-10-CM | POA: Diagnosis not present

## 2017-03-24 DIAGNOSIS — J9622 Acute and chronic respiratory failure with hypercapnia: Secondary | ICD-10-CM | POA: Diagnosis present

## 2017-03-24 DIAGNOSIS — R7989 Other specified abnormal findings of blood chemistry: Secondary | ICD-10-CM

## 2017-03-24 DIAGNOSIS — E11319 Type 2 diabetes mellitus with unspecified diabetic retinopathy without macular edema: Secondary | ICD-10-CM | POA: Diagnosis present

## 2017-03-24 DIAGNOSIS — E871 Hypo-osmolality and hyponatremia: Secondary | ICD-10-CM | POA: Diagnosis present

## 2017-03-24 DIAGNOSIS — L299 Pruritus, unspecified: Secondary | ICD-10-CM | POA: Diagnosis not present

## 2017-03-24 DIAGNOSIS — I878 Other specified disorders of veins: Secondary | ICD-10-CM | POA: Diagnosis present

## 2017-03-24 DIAGNOSIS — G2581 Restless legs syndrome: Secondary | ICD-10-CM | POA: Diagnosis present

## 2017-03-24 DIAGNOSIS — I82409 Acute embolism and thrombosis of unspecified deep veins of unspecified lower extremity: Secondary | ICD-10-CM

## 2017-03-24 DIAGNOSIS — N184 Chronic kidney disease, stage 4 (severe): Secondary | ICD-10-CM | POA: Diagnosis not present

## 2017-03-24 DIAGNOSIS — M542 Cervicalgia: Secondary | ICD-10-CM | POA: Diagnosis not present

## 2017-03-24 DIAGNOSIS — R0602 Shortness of breath: Secondary | ICD-10-CM | POA: Diagnosis present

## 2017-03-24 DIAGNOSIS — Z9111 Patient's noncompliance with dietary regimen: Secondary | ICD-10-CM

## 2017-03-24 DIAGNOSIS — N2581 Secondary hyperparathyroidism of renal origin: Secondary | ICD-10-CM | POA: Diagnosis present

## 2017-03-24 DIAGNOSIS — Z452 Encounter for adjustment and management of vascular access device: Secondary | ICD-10-CM

## 2017-03-24 DIAGNOSIS — I10 Essential (primary) hypertension: Secondary | ICD-10-CM | POA: Diagnosis not present

## 2017-03-24 DIAGNOSIS — I272 Pulmonary hypertension, unspecified: Secondary | ICD-10-CM | POA: Diagnosis present

## 2017-03-24 LAB — CBC
HCT: 28.6 % — ABNORMAL LOW (ref 35.0–47.0)
Hemoglobin: 8.9 g/dL — ABNORMAL LOW (ref 12.0–16.0)
MCH: 25.9 pg — ABNORMAL LOW (ref 26.0–34.0)
MCHC: 31.2 g/dL — AB (ref 32.0–36.0)
MCV: 83.1 fL (ref 80.0–100.0)
PLATELETS: 304 10*3/uL (ref 150–440)
RBC: 3.44 MIL/uL — ABNORMAL LOW (ref 3.80–5.20)
RDW: 16.2 % — AB (ref 11.5–14.5)
WBC: 9.1 10*3/uL (ref 3.6–11.0)

## 2017-03-24 LAB — BASIC METABOLIC PANEL
Anion gap: 10 (ref 5–15)
BUN: 59 mg/dL — AB (ref 6–20)
CALCIUM: 8.1 mg/dL — AB (ref 8.9–10.3)
CO2: 35 mmol/L — ABNORMAL HIGH (ref 22–32)
CREATININE: 2.65 mg/dL — AB (ref 0.44–1.00)
Chloride: 94 mmol/L — ABNORMAL LOW (ref 101–111)
GFR calc Af Amer: 24 mL/min — ABNORMAL LOW (ref >60)
GFR, EST NON AFRICAN AMERICAN: 21 mL/min — AB (ref >60)
GLUCOSE: 171 mg/dL — AB (ref 65–99)
Potassium: 3.5 mmol/L (ref 3.5–5.1)
Sodium: 139 mmol/L (ref 135–145)

## 2017-03-24 LAB — TROPONIN I: TROPONIN I: 0.18 ng/mL — AB (ref <0.03)

## 2017-03-24 LAB — GLUCOSE, CAPILLARY: Glucose-Capillary: 170 mg/dL — ABNORMAL HIGH (ref 65–99)

## 2017-03-24 IMAGING — CR DG CHEST 2V
1 series · 3 of 3 positions shown · non-contrast
Comparison: [DATE]

CLINICAL DATA: Shortness of Breath

EXAM:
CHEST  2 VIEW

[Series 1: dg chest 2 view · 0.14mm/px · 3 of 3 slices shown]
[im 1/3]
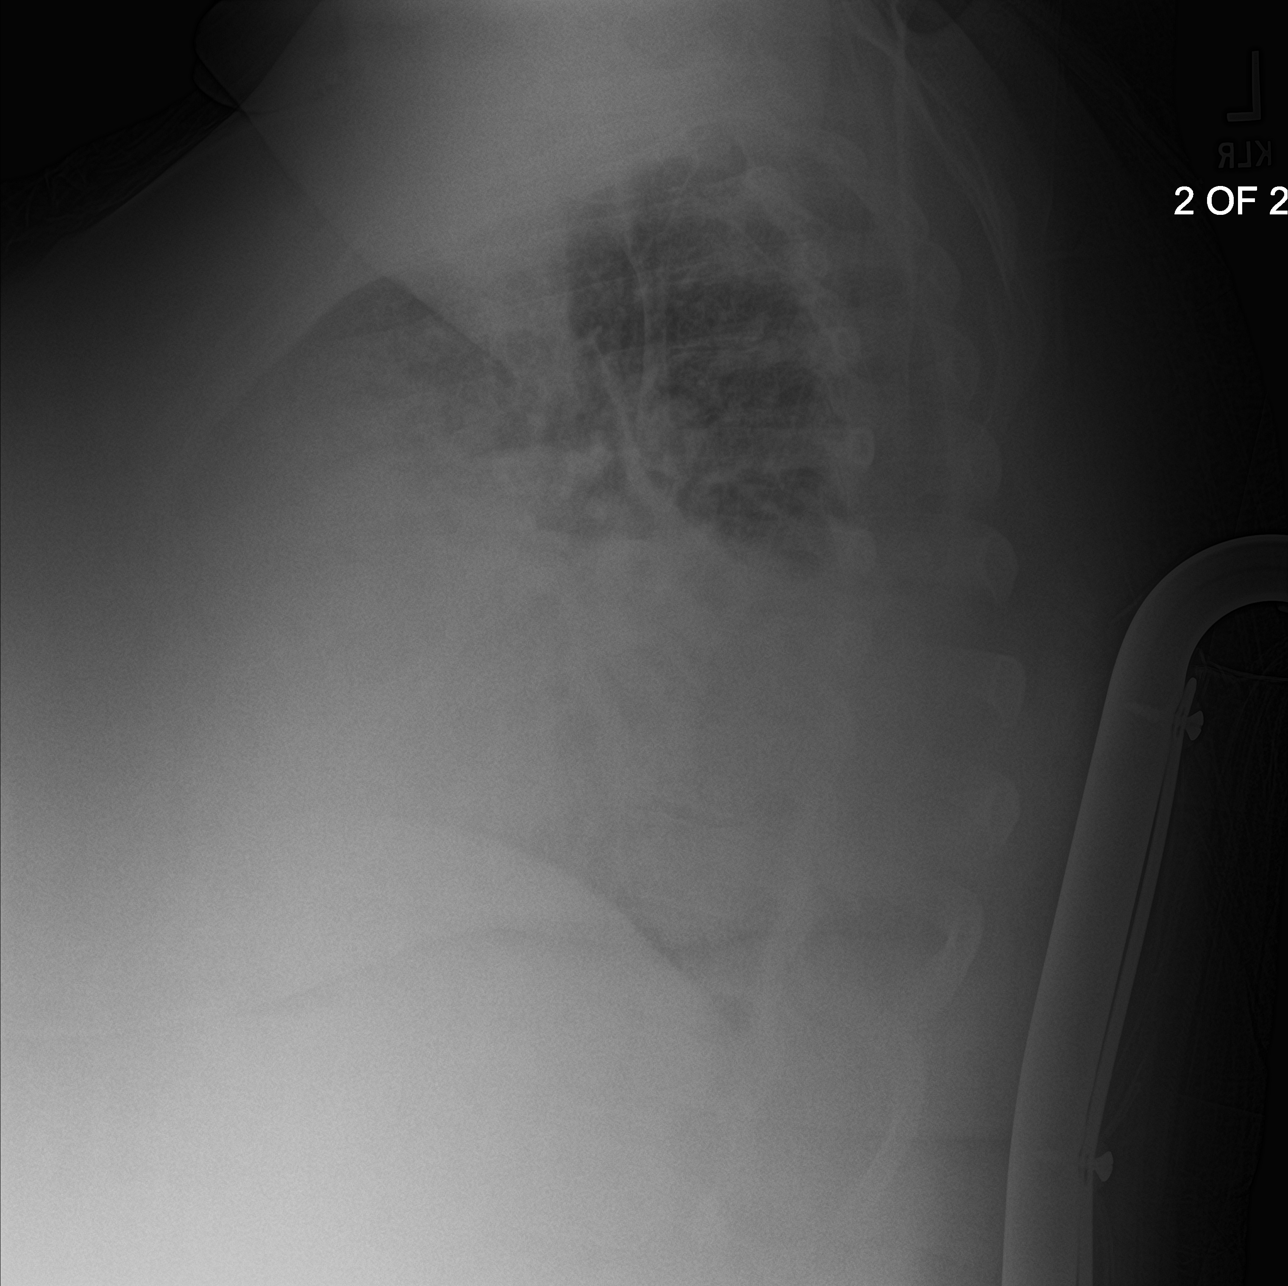
[im 2/3]
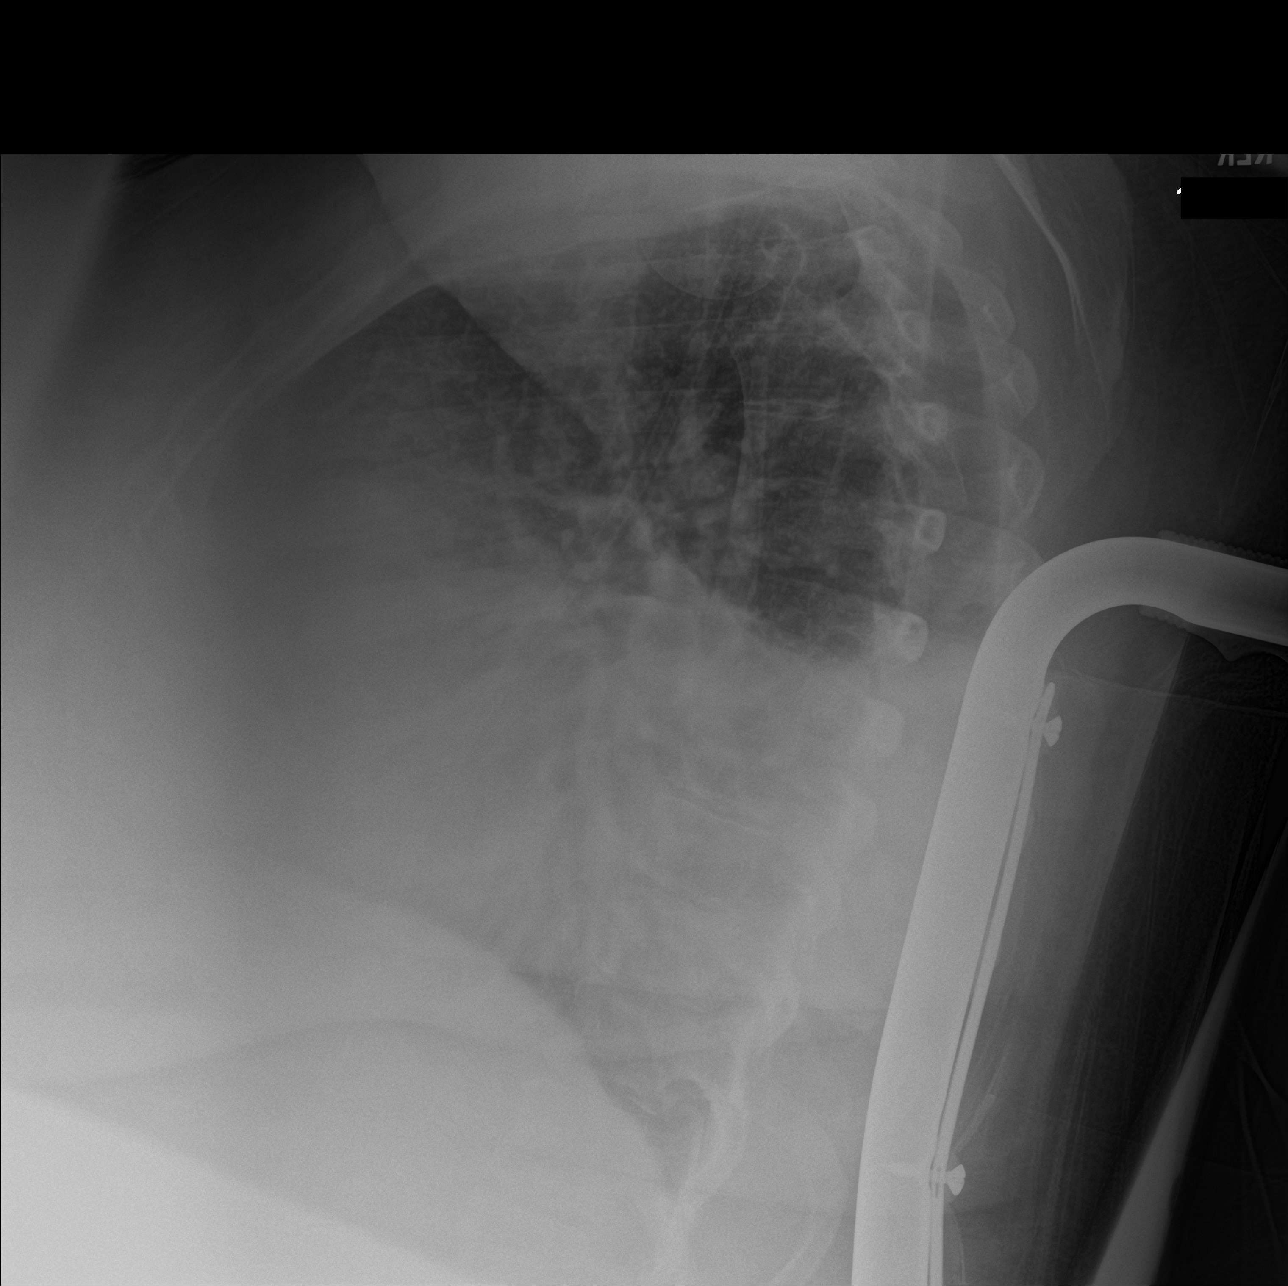
[im 3/3]
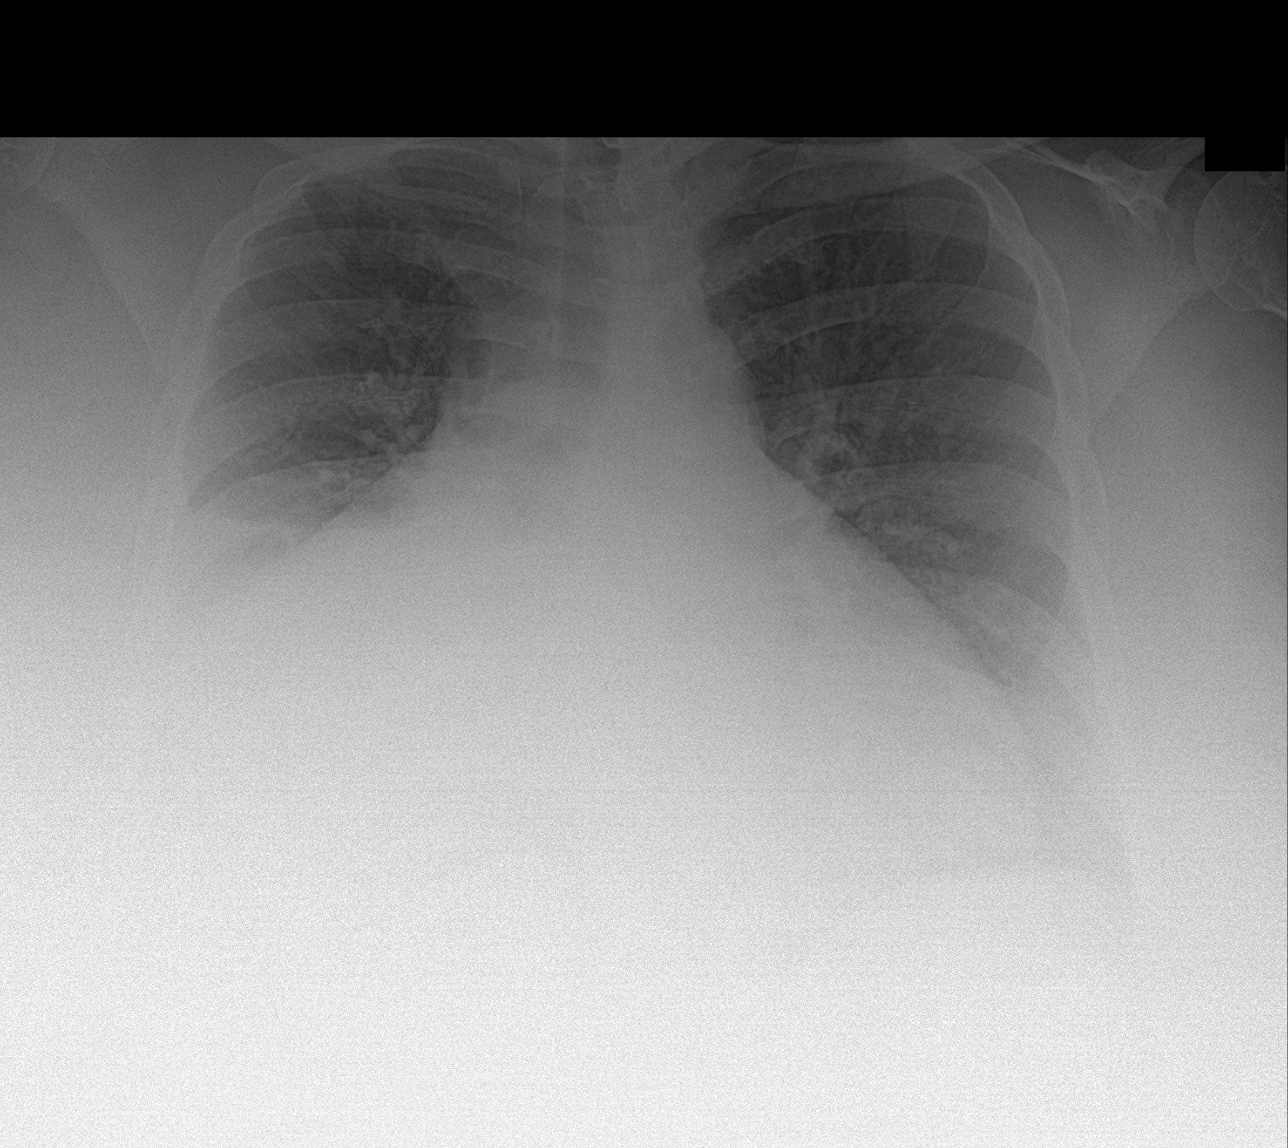

[3 of 3 positions shown; findings below may reference images not displayed]

FINDINGS: Marked cardiomegaly. Large right pleural effusion with right lower
lobe atelectasis, stable. No confluent opacity or effusion on the
left. No acute bony abnormality.
IMPRESSION: Marked cardiomegaly.

Large right pleural effusion with right lower lobe atelectasis,
stable.

## 2017-03-24 MED ORDER — METOLAZONE 5 MG PO TABS: 5.0000 mg | ORAL_TABLET | Freq: Every day | ORAL | 0 refills | Status: DC

## 2017-03-24 MED ORDER — ACETAMINOPHEN 325 MG PO TABS
650.0000 mg | ORAL_TABLET | Freq: Four times a day (QID) | ORAL | Status: DC | PRN
  Administered 2017-03-24 – 2017-04-24 (×32): 650 mg via ORAL
  Filled 2017-03-24 (×32): qty 2

## 2017-03-24 MED ORDER — ACETAMINOPHEN 650 MG RE SUPP: 650.0000 mg | Freq: Four times a day (QID) | RECTAL | Status: DC | PRN

## 2017-03-24 MED ORDER — INSULIN GLARGINE 100 UNIT/ML ~~LOC~~ SOLN
12.0000 [IU] | Freq: Every day | SUBCUTANEOUS | Status: DC
  Administered 2017-03-24 – 2017-04-27 (×35): 12 [IU] via SUBCUTANEOUS
  Filled 2017-03-24 (×36): qty 0.12

## 2017-03-24 MED ORDER — POTASSIUM CHLORIDE CRYS ER 10 MEQ PO TBCR
10.0000 meq | EXTENDED_RELEASE_TABLET | Freq: Two times a day (BID) | ORAL | Status: DC
  Administered 2017-03-24 – 2017-04-01 (×16): 10 meq via ORAL
  Filled 2017-03-24 (×32): qty 1

## 2017-03-24 MED ORDER — SODIUM CHLORIDE 0.9% FLUSH
3.0000 mL | Freq: Two times a day (BID) | INTRAVENOUS | Status: DC
  Administered 2017-03-25 – 2017-03-28 (×3): 3 mL via INTRAVENOUS

## 2017-03-24 MED ORDER — CYCLOBENZAPRINE HCL 10 MG PO TABS
5.0000 mg | ORAL_TABLET | Freq: Once | ORAL | Status: AC
  Administered 2017-03-24: 5 mg via ORAL
  Filled 2017-03-24: qty 1

## 2017-03-24 MED ORDER — CLONIDINE HCL 0.1 MG PO TABS
0.2000 mg | ORAL_TABLET | Freq: Two times a day (BID) | ORAL | Status: DC
  Administered 2017-03-24 – 2017-04-04 (×19): 0.2 mg via ORAL
  Filled 2017-03-24 (×22): qty 2

## 2017-03-24 MED ORDER — HYDRALAZINE HCL 50 MG PO TABS
100.0000 mg | ORAL_TABLET | Freq: Three times a day (TID) | ORAL | Status: DC
  Administered 2017-03-24 – 2017-04-05 (×24): 100 mg via ORAL
  Filled 2017-03-24 (×28): qty 2

## 2017-03-24 MED ORDER — ONDANSETRON HCL 4 MG PO TABS: 4.0000 mg | ORAL_TABLET | Freq: Four times a day (QID) | ORAL | Status: DC | PRN

## 2017-03-24 MED ORDER — LOSARTAN POTASSIUM 50 MG PO TABS
50.0000 mg | ORAL_TABLET | Freq: Every day | ORAL | Status: DC
  Administered 2017-03-25 – 2017-04-02 (×9): 50 mg via ORAL
  Filled 2017-03-24 (×9): qty 1

## 2017-03-24 MED ORDER — INSULIN ASPART 100 UNIT/ML ~~LOC~~ SOLN
0.0000 [IU] | Freq: Three times a day (TID) | SUBCUTANEOUS | Status: DC
  Administered 2017-03-26: 2 [IU] via SUBCUTANEOUS
  Administered 2017-03-26: 3 [IU] via SUBCUTANEOUS
  Administered 2017-03-26 – 2017-03-27 (×3): 2 [IU] via SUBCUTANEOUS
  Administered 2017-03-28: 3 [IU] via SUBCUTANEOUS
  Administered 2017-03-28: 2 [IU] via SUBCUTANEOUS
  Administered 2017-03-28: 3 [IU] via SUBCUTANEOUS
  Administered 2017-03-29 – 2017-03-30 (×5): 2 [IU] via SUBCUTANEOUS
  Administered 2017-03-31: 3 [IU] via SUBCUTANEOUS
  Administered 2017-03-31: 2 [IU] via SUBCUTANEOUS
  Administered 2017-03-31 – 2017-04-01 (×4): 3 [IU] via SUBCUTANEOUS
  Administered 2017-04-02 – 2017-04-03 (×5): 5 [IU] via SUBCUTANEOUS
  Administered 2017-04-03: 3 [IU] via SUBCUTANEOUS
  Administered 2017-04-04: 5 [IU] via SUBCUTANEOUS
  Administered 2017-04-04 (×2): 3 [IU] via SUBCUTANEOUS
  Administered 2017-04-05: 5 [IU] via SUBCUTANEOUS
  Administered 2017-04-05: 3 [IU] via SUBCUTANEOUS
  Administered 2017-04-06: 1 [IU] via SUBCUTANEOUS
  Administered 2017-04-06 (×2): 3 [IU] via SUBCUTANEOUS
  Administered 2017-04-07 – 2017-04-08 (×4): 2 [IU] via SUBCUTANEOUS
  Administered 2017-04-08: 3 [IU] via SUBCUTANEOUS
  Administered 2017-04-08: 5 [IU] via SUBCUTANEOUS
  Administered 2017-04-09 – 2017-04-10 (×4): 3 [IU] via SUBCUTANEOUS
  Administered 2017-04-10: 5 [IU] via SUBCUTANEOUS
  Administered 2017-04-11 – 2017-04-12 (×3): 3 [IU] via SUBCUTANEOUS
  Administered 2017-04-13 (×2): 2 [IU] via SUBCUTANEOUS
  Administered 2017-04-14 (×2): 3 [IU] via SUBCUTANEOUS
  Administered 2017-04-15: 2 [IU] via SUBCUTANEOUS
  Administered 2017-04-15 (×2): 3 [IU] via SUBCUTANEOUS
  Administered 2017-04-16 – 2017-04-18 (×2): 2 [IU] via SUBCUTANEOUS
  Administered 2017-04-18 – 2017-04-19 (×2): 3 [IU] via SUBCUTANEOUS
  Administered 2017-04-19: 2 [IU] via SUBCUTANEOUS
  Administered 2017-04-19 – 2017-04-21 (×5): 3 [IU] via SUBCUTANEOUS
  Administered 2017-04-21: 2 [IU] via SUBCUTANEOUS
  Administered 2017-04-22 (×3): 3 [IU] via SUBCUTANEOUS
  Administered 2017-04-23: 5 [IU] via SUBCUTANEOUS
  Administered 2017-04-23 – 2017-04-25 (×4): 3 [IU] via SUBCUTANEOUS
  Administered 2017-04-25: 2 [IU] via SUBCUTANEOUS
  Administered 2017-04-25: 3 [IU] via SUBCUTANEOUS
  Administered 2017-04-26 (×2): 5 [IU] via SUBCUTANEOUS
  Administered 2017-04-27 – 2017-04-28 (×4): 3 [IU] via SUBCUTANEOUS
  Administered 2017-04-28: 5 [IU] via SUBCUTANEOUS
  Filled 2017-03-24 (×78): qty 1

## 2017-03-24 MED ORDER — INSULIN ASPART 100 UNIT/ML ~~LOC~~ SOLN
0.0000 [IU] | Freq: Every day | SUBCUTANEOUS | Status: DC
  Administered 2017-03-27 – 2017-04-20 (×6): 2 [IU] via SUBCUTANEOUS
  Administered 2017-04-25: 3 [IU] via SUBCUTANEOUS
  Administered 2017-04-26: 2 [IU] via SUBCUTANEOUS
  Filled 2017-03-24 (×8): qty 1

## 2017-03-24 MED ORDER — METOLAZONE 2.5 MG PO TABS
5.0000 mg | ORAL_TABLET | Freq: Every day | ORAL | Status: DC
  Administered 2017-03-25 – 2017-04-01 (×8): 5 mg via ORAL
  Filled 2017-03-24: qty 2
  Filled 2017-03-24 (×2): qty 1
  Filled 2017-03-24 (×3): qty 2
  Filled 2017-03-24 (×2): qty 1
  Filled 2017-03-24 (×2): qty 2

## 2017-03-24 MED ORDER — ALBUTEROL SULFATE (2.5 MG/3ML) 0.083% IN NEBU
2.5000 mg | INHALATION_SOLUTION | RESPIRATORY_TRACT | Status: DC | PRN
  Administered 2017-03-25 – 2017-03-30 (×21): 2.5 mg via RESPIRATORY_TRACT
  Filled 2017-03-24 (×20): qty 3

## 2017-03-24 MED ORDER — METOPROLOL TARTRATE 50 MG PO TABS
100.0000 mg | ORAL_TABLET | Freq: Two times a day (BID) | ORAL | Status: DC
  Administered 2017-03-24 – 2017-04-03 (×7): 100 mg via ORAL
  Filled 2017-03-24 (×17): qty 2

## 2017-03-24 MED ORDER — ATORVASTATIN CALCIUM 20 MG PO TABS
80.0000 mg | ORAL_TABLET | Freq: Every day | ORAL | Status: DC
  Administered 2017-03-25 – 2017-03-26 (×2): 80 mg via ORAL
  Filled 2017-03-24 (×3): qty 4

## 2017-03-24 MED ORDER — FUROSEMIDE 10 MG/ML IJ SOLN
80.0000 mg | Freq: Once | INTRAMUSCULAR | Status: AC
  Administered 2017-03-24: 80 mg via INTRAVENOUS
  Filled 2017-03-24: qty 8

## 2017-03-24 MED ORDER — HEPARIN SODIUM (PORCINE) 5000 UNIT/ML IJ SOLN
5000.0000 [IU] | Freq: Three times a day (TID) | INTRAMUSCULAR | Status: DC
  Administered 2017-03-24 – 2017-04-28 (×84): 5000 [IU] via SUBCUTANEOUS
  Filled 2017-03-24 (×88): qty 1

## 2017-03-24 MED ORDER — SODIUM CHLORIDE 0.9% FLUSH
3.0000 mL | Freq: Two times a day (BID) | INTRAVENOUS | Status: DC
  Administered 2017-03-24 – 2017-04-28 (×58): 3 mL via INTRAVENOUS

## 2017-03-24 MED ORDER — ASPIRIN EC 81 MG PO TBEC
81.0000 mg | DELAYED_RELEASE_TABLET | Freq: Every day | ORAL | Status: DC
  Administered 2017-03-25 – 2017-04-28 (×32): 81 mg via ORAL
  Filled 2017-03-24 (×32): qty 1

## 2017-03-24 MED ORDER — FUROSEMIDE 10 MG/ML IJ SOLN
10.0000 mg/h | INTRAVENOUS | Status: AC
  Administered 2017-03-24: 8 mg/h via INTRAVENOUS
  Administered 2017-03-26: 10 mg/h via INTRAVENOUS
  Filled 2017-03-24 (×2): qty 25

## 2017-03-24 MED ORDER — ONDANSETRON HCL 4 MG/2ML IJ SOLN: 4.0000 mg | Freq: Four times a day (QID) | INTRAMUSCULAR | Status: DC | PRN

## 2017-03-24 MED ORDER — SODIUM CHLORIDE 0.9 % IV SOLN
250.0000 mL | INTRAVENOUS | Status: DC | PRN
  Administered 2017-04-16: 11:00:00 via INTRAVENOUS

## 2017-03-24 MED ORDER — SODIUM CHLORIDE 0.9% FLUSH
3.0000 mL | INTRAVENOUS | Status: DC | PRN
  Administered 2017-04-27: 3 mL via INTRAVENOUS
  Filled 2017-03-24: qty 3

## 2017-03-24 MED ORDER — POLYETHYLENE GLYCOL 3350 17 G PO PACK
17.0000 g | PACK | Freq: Every day | ORAL | Status: DC | PRN
  Administered 2017-04-05 – 2017-04-11 (×4): 17 g via ORAL
  Filled 2017-03-24 (×5): qty 1

## 2017-03-24 MED ORDER — METOLAZONE 5 MG PO TABS: 5.0000 mg | ORAL_TABLET | Freq: Every day | ORAL | 3 refills | Status: DC

## 2017-03-24 MED ORDER — CITALOPRAM HYDROBROMIDE 20 MG PO TABS
40.0000 mg | ORAL_TABLET | Freq: Every day | ORAL | Status: DC
  Filled 2017-03-24 (×15): qty 2

## 2017-03-24 NOTE — Patient Instructions (Addendum)
Resume weighing daily and call for an overnight weight gain of > 2 pounds or a weekly weight gain of >5 pounds.  Take metolazone 5mg  in the morning 1/2 hour prior to torsemide.   Increase potassium to 2 tablets twice daily

## 2017-03-24 NOTE — ED Triage Notes (Signed)
Patient was sent by heart clinic for fluid overload and CHF exacerbation. Patient wears 4L O2 continuous and reports being SOB X1 month. Reports aching chest pain on and off X1 month. Also c/o stuffy nose with excess mucous. A&O x4

## 2017-03-24 NOTE — Progress Notes (Signed)
[name] spouse on patient presented to appt.  Mr. Thrush stated that patient is immobile and he is unable to lift patient.    Mr. Nilan brought completed patient intake application and financial information. Verbally explained contract with patient over the phone.  Patient verbally agreed to all terms of the [facility] contract.  Sent contract home with spouse.  Patient to sign and return to [facility] via [place] [employer].    Provided Mr. Jefferson Fuel with [facility] based on patient's particular needs.    [facility]

## 2017-03-24 NOTE — H&P (Signed)
[facility] [place] NAME: [name]    MR#:  [id number]  DATE OF BIRTH:  [birth year]  DATE OF ADMISSION:  03/24/2017  PRIMARY CARE PHYSICIAN: [name], NP   REQUESTING/REFERRING PHYSICIAN: Dr. Mariea Clonts  CHIEF COMPLAINT:   Chief Complaint  Patient presents with  . Shortness of Breath  . Congestive Heart Failure    HISTORY OF PRESENT ILLNESS:  [name]  is a 47 y.o. female with a known history of systolic CHF, hypertension, diabetes, morbid obesity who was recently discharged from the hospital returns from CHF clinic due to weight gain, worsening shortness of breath.  Attempts were made to start IV Lasix through home health but were unsuccessful.  Also due to her severe shortness of breath was sent to the emergency room.  Here chest x-ray shows pulmonary edema and right effusion which is chronic.  Patient has been compliant with medications, fluid restriction and salt restriction. Patient needed IV Lasix drip during the last admission.  PAST MEDICAL HISTORY:   Past Medical History:  Diagnosis Date  . (HFpEF) heart failure with preserved ejection fraction ([place])    a. 11/2014 Echo: EF 50-55%, Gr1 DD; b. 06/2016 Echo: EF 60-65%, no rwma, mod dil LA, nl RV, triv eff.  . Asthma   . CHF (congestive heart failure) ([place])   . CKD (chronic kidney disease), stage III ([place])   . COPD (chronic obstructive pulmonary disease) ([place])    a. Home O2.  . Diabetes mellitus without complication ([place])   . Hypertension   . Iron deficiency anemia    a. chronic blood loss->heavy menses.  . Morbid obesity ([place])   . Pericardial effusion    a. 11/2014 CT Chest: Mod effusion; b. 11/2014 Echo: small to mod circumferential effusion. No hemodynamic compromise.  [name] Poorly controlled diabetes mellitus ([place])    a. 11/2014 A1c 13.2.    PAST SURGICAL HISTORY:   Past Surgical History:  Procedure Laterality Date  . CESAREAN SECTION     times 3     SOCIAL HISTORY:   Social History   Tobacco Use  . Smoking status: Never Smoker  . Smokeless tobacco: Never Used  Substance Use Topics  . Alcohol use: Yes    Alcohol/week: 0.0 oz    Comment: occ drink - <1/wk    FAMILY HISTORY:   Family History  Problem Relation Age of Onset  . Diabetes Mother   . Hypertension Mother   . Hypertension Father   . Diabetes Father   . Hypertension Unknown   . Diabetes Unknown   . Breast cancer Maternal Aunt 99  . Breast cancer Maternal Aunt 50    DRUG ALLERGIES:   Allergies  Allergen Reactions  . Dust Mite Extract Shortness Of Breath and Itching  . Mold Extract [Trichophyton] Shortness Of Breath and Itching  . Feraheme [Ferumoxytol] Itching  . Pollen Extract   . Sulfa Antibiotics Itching    REVIEW OF SYSTEMS:   Review of Systems  Constitutional: Positive for malaise/fatigue. Negative for chills, fever and weight loss.  HENT: Negative for hearing loss and nosebleeds.   Eyes: Negative for blurred vision, double vision and pain.  Respiratory: Positive for cough and shortness of breath. Negative for hemoptysis, sputum production and wheezing.   Cardiovascular: Positive for orthopnea and leg swelling. Negative for chest pain and palpitations.  Gastrointestinal: Negative for abdominal pain, constipation, diarrhea, nausea and vomiting.  Genitourinary: Negative for dysuria and hematuria.  Musculoskeletal: Positive  for back pain. Negative for falls and myalgias.  Skin: Negative for rash.  Neurological: Positive for weakness. Negative for dizziness, tremors, sensory change, speech change, focal weakness, seizures and headaches.  Endo/Heme/Allergies: Does not bruise/bleed easily.  Psychiatric/Behavioral: Negative for depression and memory loss. The patient is not nervous/anxious.     MEDICATIONS AT HOME:   Prior to Admission medications   Medication Sig Start Date End Date Taking? Authorizing Provider  acetaminophen (TYLENOL) 500 MG  tablet Take 1,000 mg by mouth every 6 (six) hours as needed for mild pain, fever or headache.   Yes [provider]  albuterol (PROVENTIL HFA;VENTOLIN HFA) 108 (90 Base) MCG/ACT inhaler Inhale 2 puffs into the lungs every 6 (six) hours as needed for wheezing or shortness of breath. 03/10/17  Yes Salary, Holly Bodily D, MD  albuterol (PROVENTIL) (2.5 MG/3ML) 0.083% nebulizer solution Take 2.5 mg by nebulization every 6 (six) hours as needed for wheezing or shortness of breath.   Yes [provider]  atorvastatin (LIPITOR) 80 MG tablet Take 1 tablet (80 mg total) by mouth daily at 6 PM. 11/16/16  Yes Mody, Sital, MD  Cholecalciferol (HM VITAMIN D3) 4000 units CAPS Take 1 capsule by mouth once a week.   Yes [provider]  citalopram (CELEXA) 40 MG tablet Take 1 tablet (40 mg total) by mouth daily. 11/16/16  Yes Mody, Ulice Bold, MD  cloNIDine (CATAPRES) 0.2 MG tablet Take 1 tablet (0.2 mg total) by mouth 2 (two) times daily. 01/23/17  Yes [name] A, FNP  hydrALAZINE (APRESOLINE) 100 MG tablet Take 1 tablet (100 mg total) by mouth every 8 (eight) hours. 11/16/16  Yes Mody, Ulice Bold, MD  insulin aspart (NOVOLOG FLEXPEN) 100 UNIT/ML FlexPen Inject 0-12 Units into the skin 3 (three) times daily with meals as needed for high blood sugar. Pt uses as needed per sliding scale. 11/16/16  Yes Mody, Sital, MD  ipratropium-albuterol (DUONEB) 0.5-2.5 (3) MG/3ML SOLN Take 3 mLs by nebulization every 6 (six) hours as needed. 03/10/17  Yes Salary, Avel Peace, MD  losartan (COZAAR) 50 MG tablet Take 50 mg daily by mouth.   Yes [provider]  metoprolol tartrate (LOPRESSOR) 100 MG tablet Take 1 tablet (100 mg total) by mouth 2 (two) times daily. 11/16/16  Yes Mody, Ulice Bold, MD  potassium chloride (K-DUR) 10 MEQ tablet Take 1 tablet (10 mEq total) by mouth 2 (two) times daily. 11/16/16  Yes Bettey Costa, MD  torsemide (DEMADEX) 100 MG tablet Take 1 tablet (100 mg total) by mouth 2 (two) times daily.  03/10/17  Yes Salary, Montell D, MD  aspirin EC 81 MG tablet Take 81 mg by mouth daily.     [provider]  cefdinir (OMNICEF) 300 MG capsule Take 1 capsule (300 mg total) by mouth 2 (two) times daily. Patient not taking: Reported on 03/24/2017 03/10/17   Salary, Avel Peace, MD  guaiFENesin-dextromethorphan (ROBITUSSIN DM) 100-10 MG/5ML syrup Take 5 mLs every 4 (four) hours as needed by mouth for cough. 01/10/17   Demetrios Loll, MD  metolazone (ZAROXOLYN) 5 MG tablet Take 1 tablet (5 mg total) by mouth daily. Patient not taking: Reported on 03/24/2017 03/24/17 06/22/17  Alisa Graff, FNP  polyethylene glycol [facility] / GLYCOLAX) packet Take 17 g by mouth daily as needed for mild constipation. 06/29/16   Vaughan Basta, MD     VITAL SIGNS:  Blood pressure (!) 174/90, pulse 91, temperature 98 F (36.7 C), temperature source Oral, resp. rate (!) 22, height 5\' 4"  (  1.626 m), weight (!) 180.5 kg (398 lb), last menstrual period 09/06/2016, SpO2 95 %.  PHYSICAL EXAMINATION:  Physical Exam  GENERAL:  47 y.o.-year-old patient lying in the bed with mild respiratory distress.  Morbidly obese. EYES: Pupils equal, round, reactive to light and accommodation. No scleral icterus. Extraocular muscles intact.  HEENT: Head atraumatic, normocephalic. Oropharynx and nasopharynx clear. No oropharyngeal erythema, moist oral mucosa  NECK:  Supple, no jugular venous distention. No thyroid enlargement, no tenderness.  LUNGS: Decreased breath sounds at the bases. CARDIOVASCULAR: S1, S2 normal. No murmurs, rubs, or gallops.  ABDOMEN: Soft, nontender, abdominal distention. Bowel sounds present. No organomegaly or mass.  EXTREMITIES: No  cyanosis, or clubbing. + 2 pedal & radial pulses b/l.  Severe lower extremity edema NEUROLOGIC: Cranial nerves II through XII are intact. No focal Motor or sensory deficits appreciated b/l PSYCHIATRIC: The patient is alert and oriented x 3. Good affect.  SKIN: No obvious rash,  lesion, or ulcer.   LABORATORY PANEL:   CBC Recent Labs  Lab 03/24/17 1715  WBC 9.1  HGB 8.9*  HCT 28.6*  PLT 304   ------------------------------------------------------------------------------------------------------------------  Chemistries  Recent Labs  Lab 03/24/17 1715  NA 139  K 3.5  CL 94*  CO2 35*  GLUCOSE 171*  BUN 59*  CREATININE 2.65*  CALCIUM 8.1*   ------------------------------------------------------------------------------------------------------------------  Cardiac Enzymes Recent Labs  Lab 03/24/17 1715  TROPONINI 0.18*   ------------------------------------------------------------------------------------------------------------------  RADIOLOGY:  Dg Chest 2 View  Result Date: 03/24/2017 CLINICAL DATA:  Shortness of Breath EXAM: CHEST  2 VIEW COMPARISON:  03/08/2017 FINDINGS: Marked cardiomegaly. Large right pleural effusion with right lower lobe atelectasis, stable. No confluent opacity or effusion on the left. No acute bony abnormality. IMPRESSION: Marked cardiomegaly. Large right pleural effusion with right lower lobe atelectasis, stable. Electronically Signed   By: Rolm Baptise M.D.   On: 03/24/2017 18:03     IMPRESSION AND PLAN:   *Acute on chronic diastolic congestive heart failure Patient is on high dose of torsemide at home.  Needed insulin drip recently in the hospital.  Will start insulin drip at 8 mg/h.  Monitor input and output.  Follow BMP.  Replace potassium and magnesium as needed.  Consult cardiology.  *Acute on chronic respiratory failure with tachypnea.  Due to CHF.  Should improve with diuresis.  Wean oxygen as tolerated.  *Hypertension.  Continue home medications.  IV hydralazine added.  *Insulin-dependent diabetes mellitus.  Lantus at lower dose.  Sliding scale insulin. Takes Tresiba 38 units at home.  *Elevated troponin likely due to CHF.  Will repeat and trend.  Cardiology consulted.  *CKD stage IV.  Monitor  creatinine while being diuresed.  *DVT prophylaxis with heparin  All the records are reviewed and case discussed with ED provider. Management plans discussed with the patient, family and they are in agreement.  CODE STATUS: FULL CODE  TOTAL TIME TAKING CARE OF THIS PATIENT: 40 minutes.   Leia Alf Eveny Anastas M.D on 03/24/2017 at 7:54 PM  Between 7am to 6pm - Pager - [phone]  After 6pm go to [email or web address] - password [email or web address] [facility]  Office  [phone]  CC: Primary care physician; [name], NP  Note: This dictation was prepared with Dragon dictation along with smaller phrase technology. Any transcriptional errors that result from this process are unintentional.

## 2017-03-24 NOTE — ED Provider Notes (Signed)
[facility] Emergency Department Provider Note   ____________________________________________   I have reviewed the triage vital signs and the nursing notes.   HISTORY  Chief Complaint Shortness of Breath and Congestive Heart Failure   History limited by: Not Limited   HPI [name] is a 47 y.o. female who presents to the emergency department today because of concern for shortness of breath. The patient has history of CHF. Per chart review had an admission for CHF exacerbation earlier this month. Patient states that since discharge she has been getting worse. She feels like the swelling has gotten worse. The swelling is accompanied by significant discomfort and cramping. The patient also states that she has been feelign weak and has been having a hard time getting up out of chairs. She has had some intermittent central sharp chest pain that comes and goes. Went to heart failure clinic today, states they were trying to arrange for her to get IV lasix but that was not able to happen. Patient denies any fevers.  Per medical record review patient has a history of recent admission for heart failure.  Past Medical History:  Diagnosis Date  . (HFpEF) heart failure with preserved ejection fraction ([place])    a. 11/2014 Echo: EF 50-55%, Gr1 DD; b. 06/2016 Echo: EF 60-65%, no rwma, mod dil LA, nl RV, triv eff.  . Asthma   . CHF (congestive heart failure) ([place])   . CKD (chronic kidney disease), stage III ([place])   . COPD (chronic obstructive pulmonary disease) ([place])    a. Home O2.  . Diabetes mellitus without complication ([place])   . Hypertension   . Iron deficiency anemia    a. chronic blood loss->heavy menses.  . Morbid obesity ([place])   . Pericardial effusion    a. 11/2014 CT Chest: Mod effusion; b. 11/2014 Echo: small to mod circumferential effusion. No hemodynamic compromise.  [name] Poorly controlled diabetes mellitus ([place])    a. 11/2014 A1c 13.2.    Patient Active  Problem List   Diagnosis Date Noted  . Obesity hypoventilation syndrome ([place]) 03/09/2017  . CHF (congestive heart failure) ([place]) 03/03/2017  . Chronic diastolic heart failure ([place]) 01/07/2017  . Pneumonia 01/07/2017  . Acute exacerbation of CHF (congestive heart failure) ([place]) 11/07/2016  . Depression 09/24/2016  . Acute on chronic diastolic heart failure ([place]) 07/04/2016  . COPD with chronic bronchitis ([place]) 07/04/2016  . CKD (chronic kidney disease), stage III ([place]) 06/26/2016  . COPD exacerbation ([place]) 03/25/2016  . Anxiety 10/02/2015  . Asthma 08/30/2015  . Poorly controlled type 2 diabetes mellitus ([place]) 03/13/2015  . Iron deficiency anemia 02/17/2015  . Morbid obesity ([place])   . Shortness of breath   . Essential hypertension   . Hypertensive heart disease with congestive heart failure ([place]) 12/18/2014  . Diabetes ([place]) 12/15/2014    Past Surgical History:  Procedure Laterality Date  . CESAREAN SECTION     times 3    Prior to Admission medications   Medication Sig Start Date End Date Taking? Authorizing Provider  acetaminophen (TYLENOL) 500 MG tablet Take 1,000 mg by mouth every 6 (six) hours as needed for mild pain, fever or headache.    [provider]  albuterol (PROVENTIL HFA;VENTOLIN HFA) 108 (90 Base) MCG/ACT inhaler Inhale 2 puffs into the lungs every 6 (six) hours as needed for wheezing or shortness of breath. 03/10/17   Salary, Holly Bodily D, MD  albuterol (PROVENTIL) (2.5 MG/3ML) 0.083% nebulizer solution Take 2.5 mg by nebulization  every 6 (six) hours as needed for wheezing or shortness of breath.    [provider]  aspirin EC 81 MG tablet Take 81 mg by mouth daily.     [provider]  atorvastatin (LIPITOR) 80 MG tablet Take 1 tablet (80 mg total) by mouth daily at 6 PM. 11/16/16   Bettey Costa, MD  cefdinir (OMNICEF) 300 MG capsule Take 1 capsule (300 mg total) by mouth 2 (two) times daily. 03/10/17   Salary, Holly Bodily D, MD  Cholecalciferol  (HM VITAMIN D3) 4000 units CAPS Take 1 capsule by mouth once a week.    [provider]  citalopram (CELEXA) 40 MG tablet Take 1 tablet (40 mg total) by mouth daily. 11/16/16   Bettey Costa, MD  cloNIDine (CATAPRES) 0.2 MG tablet Take 1 tablet (0.2 mg total) by mouth 2 (two) times daily. 01/23/17   Alisa Graff, FNP  guaiFENesin-dextromethorphan (ROBITUSSIN DM) 100-10 MG/5ML syrup Take 5 mLs every 4 (four) hours as needed by mouth for cough. 01/10/17   Demetrios Loll, MD  hydrALAZINE (APRESOLINE) 100 MG tablet Take 1 tablet (100 mg total) by mouth every 8 (eight) hours. 11/16/16   Bettey Costa, MD  insulin aspart (NOVOLOG FLEXPEN) 100 UNIT/ML FlexPen Inject 0-12 Units into the skin 3 (three) times daily with meals as needed for high blood sugar. Pt uses as needed per sliding scale. 11/16/16   Bettey Costa, MD  ipratropium-albuterol (DUONEB) 0.5-2.5 (3) MG/3ML SOLN Take 3 mLs by nebulization every 6 (six) hours as needed. 03/10/17   Salary, Avel Peace, MD  losartan (COZAAR) 50 MG tablet Take 50 mg daily by mouth.    [provider]  metolazone (ZAROXOLYN) 5 MG tablet Take 1 tablet (5 mg total) by mouth daily. 03/24/17 06/22/17  Alisa Graff, FNP  metoprolol tartrate (LOPRESSOR) 100 MG tablet Take 1 tablet (100 mg total) by mouth 2 (two) times daily. 11/16/16   Bettey Costa, MD  polyethylene glycol (MIRALAX / GLYCOLAX) packet Take 17 g by mouth daily as needed for mild constipation. 06/29/16   Vaughan Basta, MD  potassium chloride (K-DUR) 10 MEQ tablet Take 1 tablet (10 mEq total) by mouth 2 (two) times daily. 11/16/16   Bettey Costa, MD  torsemide (DEMADEX) 100 MG tablet Take 1 tablet (100 mg total) by mouth 2 (two) times daily. 03/10/17   Salary, Avel Peace, MD    Allergies Dust mite extract; Mold extract [trichophyton]; Feraheme [ferumoxytol]; Pollen extract; and Sulfa antibiotics  Family History  Problem Relation Age of Onset  . Diabetes Mother   . Hypertension Mother   .  Hypertension Father   . Diabetes Father   . Hypertension Unknown   . Diabetes Unknown   . Breast cancer Maternal Aunt 73  . Breast cancer Maternal Aunt 92    Social History Social History   Tobacco Use  . Smoking status: Never Smoker  . Smokeless tobacco: Never Used  Substance Use Topics  . Alcohol use: Yes    Alcohol/week: 0.0 oz    Comment: occ drink - <1/wk  . Drug use: No    Review of Systems Constitutional: No fever/chills Eyes: No visual changes. ENT: No sore throat. Cardiovascular: Positive for intermittent chest pain. Respiratory: Positive for shortness of breath. Gastrointestinal: No abdominal pain.  No nausea, no vomiting.  No diarrhea.   Genitourinary: Negative for dysuria. Musculoskeletal: Positive for lower extremity swelling and pain. Skin: Negative for rash. Neurological: Negative for headaches, focal weakness or numbness.  ____________________________________________  PHYSICAL EXAM:  VITAL SIGNS: ED Triage Vitals  Enc Vitals Group     BP 03/24/17 1717 (!) 174/90     Pulse Rate 03/24/17 1717 91     Resp 03/24/17 1717 (!) 22     Temp 03/24/17 1717 98 F (36.7 C)     Temp Source 03/24/17 1717 Oral     SpO2 03/24/17 1716 95 %     Weight 03/24/17 1717 (!) 398 lb (180.5 kg)     Height 03/24/17 1717 5\' 4"  (1.626 m)     Head Circumference --      Peak Flow --      Pain Score 03/24/17 1717 3    Constitutional: Alert and oriented. Well appearing and in no distress. Eyes: Conjunctivae are normal.  ENT   Head: Normocephalic and atraumatic.   Nose: No congestion/rhinnorhea.   Mouth/Throat: Mucous membranes are moist.   Neck: No stridor. Hematological/Lymphatic/Immunilogical: No cervical lymphadenopathy. Cardiovascular: Normal rate, regular rhythm.  No murmurs, rubs, or gallops.  Respiratory: Auscultation is limited secondary to body habitus, but no crackles, or wheezes. Gastrointestinal: Soft and non tender. No rebound. No guarding.   Genitourinary: Deferred Musculoskeletal: Normal range of motion in all extremities. Positive for bilateral lower extremity edema.  Neurologic:  Normal speech and language. No gross focal neurologic deficits are appreciated.  Skin:  Skin is warm, dry and intact. No rash noted. Psychiatric: Mood and affect are normal. Speech and behavior are normal. Patient exhibits appropriate insight and judgment.  ____________________________________________    LABS (pertinent positives/negatives)  Trop 0.18 CBC wbc 9.1, hgb 8.9, plt 304 BMP cr 2.65, glu 171  ____________________________________________   EKG  I, Nance Pear, attending physician, personally viewed and interpreted this EKG  EKG Time: 1719 Rate: 94 Rhythm: sinus rhythm with pac Axis: normal Intervals: qtc 435 QRS: narrow ST changes: no st elevation Impression: abnormal ekg   ____________________________________________    RADIOLOGY  CXR Large pleural effusion, cardiomegaly  I, Ellsie Violette, personally viewed and evaluated these images (plain radiographs) as part of my medical decision making. ____________________________________________   PROCEDURES  Procedures  CRITICAL CARE Performed by: Nance Pear   Total critical care time: 30 minutes  Critical care time was exclusive of separately billable procedures and treating other patients.  Critical care was necessary to treat or prevent imminent or life-threatening deterioration.  Critical care was time spent personally by me on the following activities: development of treatment plan with patient and/or surrogate as well as nursing, discussions with consultants, evaluation of patient's response to treatment, examination of patient, obtaining history from patient or surrogate, ordering and performing treatments and interventions, ordering and review of laboratory studies, ordering and review of radiographic studies, pulse oximetry and re-evaluation  of patient's condition.  ____________________________________________   INITIAL IMPRESSION / ASSESSMENT AND PLAN / ED COURSE  Pertinent labs & imaging results that were available during my care of the patient were reviewed by me and considered in my medical decision making (see chart for details).  Presented to the emergency department today because of concerns for increasing shortness of breath and swelling.  The patient has history of CHF.  Chest x-ray shows a large pleural effusion.  It does not look significantly changed from previous x-ray.  She does have significant swelling on exam and I think CHF exacerbation is likely.  Will start IV Lasix and plan on admission to the hospital service.  Discussed this with the patient.  She was also requesting muscle relaxant for muscle  cramping.  ____________________________________________   FINAL CLINICAL IMPRESSION(S) / ED DIAGNOSES  Final diagnoses:  Shortness of breath  Congestive heart failure, unspecified HF chronicity, unspecified heart failure type ([place])  Elevated troponin     Note: This dictation was prepared with Dragon dictation. Any transcriptional errors that result from this process are unintentional     Nance Pear, MD 03/24/17 1948

## 2017-03-25 DIAGNOSIS — I248 Other forms of acute ischemic heart disease: Secondary | ICD-10-CM

## 2017-03-25 LAB — MRSA PCR SCREENING: MRSA by PCR: NEGATIVE

## 2017-03-25 LAB — BASIC METABOLIC PANEL
ANION GAP: 9 (ref 5–15)
BUN: 58 mg/dL — ABNORMAL HIGH (ref 6–20)
CALCIUM: 7.8 mg/dL — AB (ref 8.9–10.3)
CO2: 33 mmol/L — ABNORMAL HIGH (ref 22–32)
Chloride: 94 mmol/L — ABNORMAL LOW (ref 101–111)
Creatinine, Ser: 2.63 mg/dL — ABNORMAL HIGH (ref 0.44–1.00)
GFR, EST AFRICAN AMERICAN: 24 mL/min — AB (ref >60)
GFR, EST NON AFRICAN AMERICAN: 21 mL/min — AB (ref >60)
GLUCOSE: 218 mg/dL — AB (ref 65–99)
Potassium: 3.7 mmol/L (ref 3.5–5.1)
Sodium: 136 mmol/L (ref 135–145)

## 2017-03-25 LAB — CBC
HCT: 26.7 % — ABNORMAL LOW (ref 35.0–47.0)
HEMOGLOBIN: 8.2 g/dL — AB (ref 12.0–16.0)
MCH: 25.9 pg — ABNORMAL LOW (ref 26.0–34.0)
MCHC: 30.8 g/dL — AB (ref 32.0–36.0)
MCV: 84 fL (ref 80.0–100.0)
PLATELETS: 267 10*3/uL (ref 150–440)
RBC: 3.18 MIL/uL — AB (ref 3.80–5.20)
RDW: 15.9 % — ABNORMAL HIGH (ref 11.5–14.5)
WBC: 8.5 10*3/uL (ref 3.6–11.0)

## 2017-03-25 LAB — GLUCOSE, CAPILLARY
GLUCOSE-CAPILLARY: 137 mg/dL — AB (ref 65–99)
GLUCOSE-CAPILLARY: 108 mg/dL — AB (ref 65–99)
GLUCOSE-CAPILLARY: 142 mg/dL — AB (ref 65–99)
Glucose-Capillary: 183 mg/dL — ABNORMAL HIGH (ref 65–99)

## 2017-03-25 LAB — TROPONIN I
TROPONIN I: 0.15 ng/mL — AB (ref <0.03)
Troponin I: 0.17 ng/mL (ref <0.03)

## 2017-03-25 LAB — MAGNESIUM: MAGNESIUM: 1.3 mg/dL — AB (ref 1.7–2.4)

## 2017-03-25 MED ORDER — CYCLOBENZAPRINE HCL 5 MG PO TABS
7.5000 mg | ORAL_TABLET | Freq: Three times a day (TID) | ORAL | Status: DC | PRN
  Administered 2017-03-25 – 2017-04-24 (×22): 7.5 mg via ORAL
  Filled 2017-03-25 (×28): qty 1.5

## 2017-03-25 MED ORDER — SALINE SPRAY 0.65 % NA SOLN
1.0000 | NASAL | Status: DC | PRN
  Administered 2017-03-25: 1 via NASAL
  Filled 2017-03-25: qty 44

## 2017-03-25 MED ORDER — MAGNESIUM SULFATE 4 GM/100ML IV SOLN
4.0000 g | Freq: Once | INTRAVENOUS | Status: AC
  Administered 2017-03-25: 4 g via INTRAVENOUS
  Filled 2017-03-25: qty 100

## 2017-03-25 NOTE — Consult Note (Signed)
Cardiology Consultation:   Patient ID: [name]; [id number]; 1970-04-03   Admit date: 03/24/2017 Date of Consult: 03/25/2017  Primary Care Provider: [name], NP Primary Cardiologist: Rockey Situ   Patient Profile:   [name] is a 47 y.o. female with a hx of chronic diastolic heart failure, chronic kidney disease stage III-IV, HTN, DM, iron deficiency anemia, and morbid obesity with suspected OSA/OHS who is being seen today for the evaluation of acute on chronic diastolic CHF at the request of Dr. Darvin Neighbours.  History of Present Illness:   [name] was recently admitted to [facility] in early 02/2017 with acute on chronic diastolic CHF with massive volume overload of at least 50 pounds, possibly more. Admission weight of 403 pounds. Discharge weight of 391 pounds. Diuresis was complicated by worsening renal function, ultimately requiring Lasix gtt. Discharged on torsemide 100 mg bid, Lopressor 100 mg bid, losartan 50 mg daily, hydralazine 100 mg tid, clonidine 0.2 mg bid, ASA 81 mg daily, and Lipitor 80 mg daily along with her non-cardiac medications. She reports compliance with her medications.  She was seen in the [facility] on 1/28 for hospital follow up. Had to call EMS to get her out of her house for that appointment as she was too weak to stand on her own. She was noted to be volume overloaded at that visit with a weight of 398 pounds. Initially, there was talk of possible outpatient IV diuresis, though given her weakened state requiring EMS to transport her out of her house the decision was made to admit her for inpatient diuresis. Of note, she was not compliant with her diet, medications, or follow up plans. She did not fill out her medication management forms. BP has peaked at 215/159. She was started on a Lasix gtt and has a Foley in place. There is no documented UOP to date. She reports she continues to be SOB and is essentially unchanged from her prior  admission. Reported weight of 409 pounds, uncertain how accurate this is. Troponin minimally elevated and flat trending with a peak of 0.18. Magnesium low 1.3 currently receiving IV magnesium, HGB down trending to 8.2, SCr 2.63-->2.65.  Echo from 10/2016 showed an EF of 55%, mild MR.    Past Medical History:  Diagnosis Date  . (HFpEF) heart failure with preserved ejection fraction ([place])    a. 11/2014 Echo: EF 50-55%, Gr1 DD; b. 06/2016 Echo: EF 60-65%, no rwma, mod dil LA, nl RV, triv eff.  . Asthma   . CHF (congestive heart failure) ([place])   . CKD (chronic kidney disease), stage III ([place])   . COPD (chronic obstructive pulmonary disease) ([place])    a. Home O2.  . Diabetes mellitus without complication ([place])   . Hypertension   . Iron deficiency anemia    a. chronic blood loss->heavy menses.  . Morbid obesity ([place])   . Pericardial effusion    a. 11/2014 CT Chest: Mod effusion; b. 11/2014 Echo: small to mod circumferential effusion. No hemodynamic compromise.  [name] Poorly controlled diabetes mellitus ([employer])    a. 11/2014 A1c 13.2.    Past Surgical History:  Procedure Laterality Date  . CESAREAN SECTION     times 3     Home Meds: Prior to Admission medications   Medication Sig Start Date End Date Taking? Authorizing Provider  acetaminophen (TYLENOL) 500 MG tablet Take 1,000 mg by mouth every 6 (six) hours as needed for mild pain, fever or headache.  Yes [provider]  albuterol (PROVENTIL HFA;VENTOLIN HFA) 108 (90 Base) MCG/ACT inhaler Inhale 2 puffs into the lungs every 6 (six) hours as needed for wheezing or shortness of breath. 03/10/17  Yes Salary, Holly Bodily D, MD  albuterol (PROVENTIL) (2.5 MG/3ML) 0.083% nebulizer solution Take 2.5 mg by nebulization every 6 (six) hours as needed for wheezing or shortness of breath.   Yes [provider]  atorvastatin (LIPITOR) 80 MG tablet Take 1 tablet (80 mg total) by mouth daily at 6 PM. 11/16/16  Yes Mody, Sital, MD    Cholecalciferol (HM VITAMIN D3) 4000 units CAPS Take 1 capsule by mouth once a week.   Yes [provider]  citalopram (CELEXA) 40 MG tablet Take 1 tablet (40 mg total) by mouth daily. 11/16/16  Yes Mody, Ulice Bold, MD  cloNIDine (CATAPRES) 0.2 MG tablet Take 1 tablet (0.2 mg total) by mouth 2 (two) times daily. 01/23/17  Yes [name] A, FNP  hydrALAZINE (APRESOLINE) 100 MG tablet Take 1 tablet (100 mg total) by mouth every 8 (eight) hours. 11/16/16  Yes Mody, Ulice Bold, MD  insulin aspart (NOVOLOG FLEXPEN) 100 UNIT/ML FlexPen Inject 0-12 Units into the skin 3 (three) times daily with meals as needed for high blood sugar. Pt uses as needed per sliding scale. 11/16/16  Yes Mody, Sital, MD  ipratropium-albuterol (DUONEB) 0.5-2.5 (3) MG/3ML SOLN Take 3 mLs by nebulization every 6 (six) hours as needed. 03/10/17  Yes Salary, Avel Peace, MD  losartan (COZAAR) 50 MG tablet Take 50 mg daily by mouth.   Yes [provider]  metoprolol tartrate (LOPRESSOR) 100 MG tablet Take 1 tablet (100 mg total) by mouth 2 (two) times daily. 11/16/16  Yes Mody, Ulice Bold, MD  potassium chloride (K-DUR) 10 MEQ tablet Take 1 tablet (10 mEq total) by mouth 2 (two) times daily. 11/16/16  Yes Bettey Costa, MD  torsemide (DEMADEX) 100 MG tablet Take 1 tablet (100 mg total) by mouth 2 (two) times daily. 03/10/17  Yes Salary, Montell D, MD  aspirin EC 81 MG tablet Take 81 mg by mouth daily.     [provider]  cefdinir (OMNICEF) 300 MG capsule Take 1 capsule (300 mg total) by mouth 2 (two) times daily. Patient not taking: Reported on 03/24/2017 03/10/17   Salary, Avel Peace, MD  guaiFENesin-dextromethorphan (ROBITUSSIN DM) 100-10 MG/5ML syrup Take 5 mLs every 4 (four) hours as needed by mouth for cough. 01/10/17   Demetrios Loll, MD  metolazone (ZAROXOLYN) 5 MG tablet Take 1 tablet (5 mg total) by mouth daily. Patient not taking: Reported on 03/24/2017 03/24/17 06/22/17  Alisa Graff, FNP  polyethylene glycol [facility] /  GLYCOLAX) packet Take 17 g by mouth daily as needed for mild constipation. 06/29/16   Vaughan Basta, MD    Inpatient Medications: Scheduled Meds: . aspirin EC  81 mg Oral Daily  . atorvastatin  80 mg Oral q1800  . citalopram  40 mg Oral Daily  . cloNIDine  0.2 mg Oral BID  . heparin  5,000 Units Subcutaneous Q8H  . hydrALAZINE  100 mg Oral Q8H  . insulin aspart  0-15 Units Subcutaneous TID WC  . insulin aspart  0-5 Units Subcutaneous QHS  . insulin glargine  12 Units Subcutaneous QHS  . losartan  50 mg Oral Daily  . metolazone  5 mg Oral Daily  . metoprolol tartrate  100 mg Oral BID  . potassium chloride  10 mEq Oral BID  . sodium chloride flush  3 mL Intravenous  Q12H  . sodium chloride flush  3 mL Intravenous Q12H   Continuous Infusions: . sodium chloride    . furosemide (LASIX) infusion 10 mg/hr (03/25/17 1054)   PRN Meds: sodium chloride, acetaminophen **OR** acetaminophen, albuterol, cyclobenzaprine, ondansetron **OR** ondansetron (ZOFRAN) IV, polyethylene glycol, sodium chloride flush  Allergies:   Allergies  Allergen Reactions  . Dust Mite Extract Shortness Of Breath and Itching  . Mold Extract [Trichophyton] Shortness Of Breath and Itching  . Feraheme [Ferumoxytol] Itching  . Pollen Extract   . Sulfa Antibiotics Itching    Social History:   Social History   Socioeconomic History  . Marital status: Married    Spouse name: Not on file  . Number of children: Not on file  . Years of education: 69  . Highest education level: High school graduate  Social Needs  . Financial resource strain: Very hard  . Food insecurity - worry: Often true  . Food insecurity - inability: Often true  . Transportation needs - medical: Yes  . Transportation needs - non-medical: Yes  Occupational History  . Occupation: Disabled  Tobacco Use  . Smoking status: Never Smoker  . Smokeless tobacco: Never Used  Substance and Sexual Activity  . Alcohol use: Yes    Alcohol/week:  0.0 oz    Comment: occ drink - <1/wk  . Drug use: No  . Sexual activity: Not on file  Other Topics Concern  . Not on file  Social History Narrative   Lives in [place] with her husband and 70 y/o child.  Three other children are grown and out of the house.  She is sedentary and does not routinely exercise.  On home O2.     Family History:   Family History  Problem Relation Age of Onset  . Diabetes Mother   . Hypertension Mother   . Hypertension Father   . Diabetes Father   . Hypertension Unknown   . Diabetes Unknown   . Breast cancer Maternal Aunt 47  . Breast cancer Maternal Aunt 50    ROS:  Review of Systems  Constitutional: Positive for malaise/fatigue. Negative for chills, diaphoresis, fever and weight loss.  HENT: Negative for congestion.   Eyes: Negative for discharge and redness.  Respiratory: Positive for cough, shortness of breath and wheezing. Negative for hemoptysis and sputum production.   Cardiovascular: Positive for orthopnea and leg swelling. Negative for chest pain, palpitations, claudication and PND.  Gastrointestinal: Negative for abdominal pain, blood in stool, heartburn, melena, nausea and vomiting.  Genitourinary: Negative for hematuria.  Musculoskeletal: Negative for falls and myalgias.  Skin: Negative for rash.  Neurological: Positive for weakness. Negative for dizziness, tingling, tremors, sensory change, speech change, focal weakness and loss of consciousness.  Endo/Heme/Allergies: Does not bruise/bleed easily.  Psychiatric/Behavioral: Negative for substance abuse. The patient is not nervous/anxious.   All other systems reviewed and are negative.     Physical Exam/Data:   Vitals:   03/24/17 2230 03/25/17 0359 03/25/17 0436 03/25/17 0935  BP: (!) 146/92  122/67 127/84  Pulse: 94  76 85  Resp: 18  18   Temp: 98.3 F (36.8 C)  (!) 97.5 F (36.4 C) 98.6 F (37 C)  TempSrc: Oral  Oral Oral  SpO2: 96% 96% 97% 100%  Weight:      Height:       No  intake or output data in the 24 hours ending 03/25/17 1226 Filed Weights   03/24/17 1717  Weight: (!) 398 lb (180.5 kg)  Body mass index is 68.32 kg/m.   Physical Exam: General: Morbidly obese, well developed, well nourished, in no acute distress. Head: Normocephalic, atraumatic, sclera non-icteric, no xanthomas, nares without discharge. Neck: Negative for carotid bruits. JVD not elevated. Lungs: Diminished breath sounds bilaterally Breathing is unlabored. Heart: RRR with S1 S2. No murmurs, rubs, or gallops appreciated. Abdomen: Soft, non-tender, non-distended with normoactive bowel sounds. No hepatomegaly. No rebound/guarding. No obvious abdominal masses. Msk:  Strength and tone appear normal for age. Extremities: No clubbing or cyanosis. 2-3+ pitting edema. Distal pedal pulses are 2+ and equal bilaterally. Neuro: Alert and oriented X 3. No facial asymmetry. No focal deficit. Moves all extremities spontaneously. Psych:  Responds to questions appropriately with a normal affect.   EKG:  The EKG was personally reviewed and demonstrates: NSR, 94 bpm, PACs, nonspecific st/t changes Telemetry:  Telemetry was personally reviewed and demonstrates: NSR  Weights: Filed Weights   03/24/17 1717  Weight: (!) 398 lb (180.5 kg)    Relevant CV Studies: TTE 10/2016: Study Conclusions  - Left ventricle: The cavity size was mildly dilated. Systolic   function was normal. The estimated ejection fraction was 55%.   Wall motion was normal; there were no regional wall motion   abnormalities. Doppler parameters are consistent with abnormal   left ventricular relaxation (grade 1 diastolic dysfunction). - Mitral valve: There was mild regurgitation. - Atrial septum: No defect or patent foramen ovale was identified.  Impressions:  - Normal LVEF with mild to moderate MR and mild diastolic   dysfunction.  Laboratory Data:  Chemistry Recent Labs  Lab 03/24/17 1715 03/25/17 0412  NA 139 136    K 3.5 3.7  CL 94* 94*  CO2 35* 33*  GLUCOSE 171* 218*  BUN 59* 58*  CREATININE 2.65* 2.63*  CALCIUM 8.1* 7.8*  GFRNONAA 21* 21*  GFRAA 24* 24*  ANIONGAP 10 9    No results for input(s): PROT, ALBUMIN, AST, ALT, ALKPHOS, BILITOT in the last 168 hours. Hematology Recent Labs  Lab 03/24/17 1715 03/25/17 0412  WBC 9.1 8.5  RBC 3.44* 3.18*  HGB 8.9* 8.2*  HCT 28.6* 26.7*  MCV 83.1 84.0  MCH 25.9* 25.9*  MCHC 31.2* 30.8*  RDW 16.2* 15.9*  PLT 304 267   Cardiac Enzymes Recent Labs  Lab 03/24/17 1715 03/24/17 2345 03/25/17 0412  TROPONINI 0.18* 0.15* 0.17*   No results for input(s): TROPIPOC in the last 168 hours.  BNPNo results for input(s): BNP, PROBNP in the last 168 hours.  DDimer No results for input(s): DDIMER in the last 168 hours.  Radiology/Studies:  Dg Chest 2 View  Result Date: 03/24/2017 CLINICAL DATA:  Shortness of Breath EXAM: CHEST  2 VIEW COMPARISON:  03/08/2017 FINDINGS: Marked cardiomegaly. Large right pleural effusion with right lower lobe atelectasis, stable. No confluent opacity or effusion on the left. No acute bony abnormality. IMPRESSION: Marked cardiomegaly. Large right pleural effusion with right lower lobe atelectasis, stable. Electronically Signed   By: Rolm Baptise M.D.   On: 03/24/2017 18:03    Assessment and Plan:   1. Acute on diastolic CHF: -She continues to be massively volume overloaded -She will require multiple days of inpatient IV diuresis -Increase Lasix gtt to 10 mg/hr -Already received metolazone 5 mg this AM -Monitor renal function and potassium with diuresis with recommendation to replete lytes as indicated  -Likely in the setting of medication and dietary noncompliance with underlying morbid obesity possible OSA/OHS -Suspect diuresis is going to be difficult given her underlying  renal dysfunction -No need to repeat echo given recent study in 01/2017  2. CKD stage III: -Monitor with diuresis -Per nephrology  3.  Anemia: -Per IM  4. Hypomagnesemia: -Replete to goal > 2.0  5. Elevated troponin: -Minimally elevated with a peak of 0.18 -No chest pain -Likely supply demand ischemia in the setting of volume overload and underlying CKD  6. Accelerated HTN: -Improved on continuing home medications -Suspect noncompliance  -Per IM   7. Dispo: -Consider Palliative care consult to discuss [place]   For questions or updates, please contact [place] Please consult [email or web address] for contact info under Cardiology/STEMI.   Signed, Christell Faith, PA-C [place] Pager: [phone] 03/25/2017, 12:26 PM

## 2017-03-25 NOTE — Care Management (Signed)
Patient was followed by [name] under [facility].  Patient  declined visits so patient was closed to services.  Agency will not accept patient again.  Continues with chronic 02 through [facility].  Per [name]- all information has been submitted for patient's disability medicaid.  Awaiting response from medicaid.  Per [facility],  application has been completed. Discussed whether para medicine program could provide services for patient.

## 2017-03-25 NOTE — Evaluation (Signed)
Physical Therapy Evaluation Patient Details Name: [name] MRN: [id number] DOB: 1970-08-30 Today's Date: 03/25/2017   History of Present Illness  Pt is a44 y.o.femalewith a known history of systolic CHF, hypertension, diabetes, morbid obesity who was recently discharged from the hospital returns from CHF clinic due to weight gain, worsening shortness of breath. Attempts were made to start IV Lasix through home health but were unsuccessful. Also due to her severe shortness of breath was sent to the emergency room. Here chest x-ray shows pulmonary edema and right effusion which is chronic. Patient has been compliant with medications, fluid restriction and salt restriction.  Assessment includes: Acute on chronic diastolic congestive heart failure, acute on chronic respiratory failure with tachypnea due to CHF, HTN, DM, elevated troponin likely due to CHF, and CKD stage IV.    Clinical Impression  Pt presents with deficits in strength, transfers, mobility, gait, balance, and activity tolerance.  Pt required extensive encouragement to participate with PT services this date with education provided on physiological benefits of activity.  Pt required +2 mod-max A with all bed mobility tasks and training with cues for sequencing.  Pt required +2 min-mod A during sit to/from stand transfers from elevated EOB with cues for sequencing provided.  In standing pt presented with heavy use of BUEs on BRW with flexed trunk posture and was able to take several very small steps at EOB before requiring to return to sitting.  Pt education/review provided on basic supine HEP.  No adverse symptoms reported during session other than general fatigue.  Pt will benefit from PT services in a SNF setting upon discharge to safely address above deficits for decreased caregiver assistance and eventual return to PLOF.       Follow Up Recommendations SNF    Equipment Recommendations  Other (comment)(Bariatric RW  recommended prior visit, need to confirm if pt received it, Pt stated owned a rollator)    Recommendations for Other Services       Precautions / Restrictions Precautions Precautions: Fall Restrictions Weight Bearing Restrictions: No      Mobility  Bed Mobility Overal bed mobility: Needs Assistance Bed Mobility: Supine to Sit;Sit to Supine     Supine to sit: +2 for physical assistance;Mod assist;Max assist Sit to supine: Mod assist;Max assist;+2 for physical assistance   General bed mobility comments: Pt required assistance for BLEs and upper body in/out of bed  Transfers Overall transfer level: Needs assistance Equipment used: Rolling walker (2 wheeled) Transfers: Sit to/from Stand Sit to Stand: +2 physical assistance;Min assist;Mod assist;From elevated surface         General transfer comment: Verbal cues for sequencing with standing from elevated EOB  Ambulation/Gait Ambulation/Gait assistance: Min guard;+2 safety/equipment Ambulation Distance (Feet): 2 Feet Assistive device: Rolling walker (2 wheeled) Gait Pattern/deviations: Step-to pattern;Trunk flexed;Wide base of support   Gait velocity interpretation: Below normal speed for age/gender General Gait Details: 2-3 small steps at EOB with flexed trunk posture, heavy use of BUEs on RW, and short step length.  Stairs            Wheelchair Mobility    Modified Rankin (Stroke Patients Only)       Balance Overall balance assessment: Needs assistance Sitting-balance support: No upper extremity supported;Feet supported Sitting balance-Leahy Scale: Fair Sitting balance - Comments: Pt shifted to R in sitting secondary to body habitus   Standing balance support: Bilateral upper extremity supported Standing balance-Leahy Scale: Fair  Pertinent Vitals/Pain Pain Assessment: 0-10 Pain Score: 7  Pain Location: B knees Pain Descriptors / Indicators: Aching;Sore Pain  Intervention(s): Monitored during session;Premedicated before session    [place] expects to be discharged to:: Private residence Living Arrangements: Spouse/significant other;Children Available Help at Discharge: Family;Personal care attendant(PCA 3x/wk) Type of Home: House Home Access: Stairs to enter Entrance Stairs-Rails: None Entrance Stairs-Number of Steps: 4 Home Layout: One level Home Equipment: Environmental consultant - 4 wheels;Wheelchair - manual      Prior Function Level of Independence: Needs assistance   Gait / Transfers Assistance Needed: Mod Ind amb limited household distances with rollator, one fall in last 6 months; pt has not been able to ascend/descend steps for 2 months and requires EMS assist in/out of home.  ADL's / Homemaking Assistance Needed: Pt requires assistance for all ADLs, IADLs. Pt. has a personal care aide 3 days a week, approximately one hour. Pt. husband assists with cooking, cleaning, and driving.        Hand Dominance   Dominant Hand: Right    Extremity/Trunk Assessment   Upper Extremity Assessment Upper Extremity Assessment: Overall WFL for tasks assessed    Lower Extremity Assessment Lower Extremity Assessment: Generalized weakness       Communication   Communication: No difficulties  Cognition Arousal/Alertness: Awake/alert Behavior During Therapy: WFL for tasks assessed/performed Overall Cognitive Status: Within Functional Limits for tasks assessed                                        General Comments      Exercises     Assessment/Plan    PT Assessment Patient needs continued PT services  PT Problem List Decreased strength;Decreased activity tolerance;Decreased balance;Decreased mobility;Decreased safety awareness;Cardiopulmonary status limiting activity;Obesity       PT Treatment Interventions DME instruction;Gait training;Stair training;Therapeutic activities;Functional mobility  training;Therapeutic exercise;Balance training;Patient/family education    PT Goals (Current goals can be found in the Care Plan section)  Acute Rehab PT Goals Patient Stated Goal: To walk better PT Goal Formulation: With patient Time For Goal Achievement: 04/07/17 Potential to Achieve Goals: Fair    Frequency Min 2X/week   Barriers to discharge Inaccessible home environment      Co-evaluation               AM-PAC PT "6 Clicks" Daily Activity  Outcome Measure Difficulty turning over in bed (including adjusting bedclothes, sheets and blankets)?: Unable Difficulty moving from lying on back to sitting on the side of the bed? : Unable Difficulty sitting down on and standing up from a chair with arms (e.g., wheelchair, bedside commode, etc,.)?: Unable Help needed moving to and from a bed to chair (including a wheelchair)?: A Lot Help needed walking in hospital room?: Total Help needed climbing 3-5 steps with a railing? : Total 6 Click Score: 7    End of Session Equipment Utilized During Treatment: Oxygen Activity Tolerance: Patient limited by fatigue Patient left: in bed;with call bell/phone within reach;with bed alarm set;with family/visitor present Nurse Communication: Mobility status PT Visit Diagnosis: Difficulty in walking, not elsewhere classified (R26.2);Muscle weakness (generalized) (M62.81)    Time: 4765-4650 PT Time Calculation (min) (ACUTE ONLY): 39 min   Charges:   PT Evaluation $PT Eval Low Complexity: 1 Low PT Treatments $Therapeutic Activity: 8-22 mins   PT G Codes:        DRoyetta Asal PT, DPT 03/25/17,  12:08 PM

## 2017-03-25 NOTE — Progress Notes (Signed)
[facility] [place] NAME: Allysha Tryon    MR#:  [id number]  DATE OF BIRTH:  06-Sep-1970  SUBJECTIVE:  CHIEF COMPLAINT:   Chief Complaint  Patient presents with  . Shortness of Breath  . Congestive Heart Failure   Still shortness of breath, leg edema and abdominal distention and edema REVIEW OF SYSTEMS:  Review of Systems  Constitutional: Negative for chills, fever and malaise/fatigue.  HENT: Negative for sore throat.   Eyes: Negative for blurred vision and double vision.  Respiratory: Positive for cough, sputum production and shortness of breath. Negative for hemoptysis, wheezing and stridor.   Cardiovascular: Positive for leg swelling. Negative for chest pain, palpitations and orthopnea.  Gastrointestinal: Negative for abdominal pain, blood in stool, diarrhea, melena, nausea and vomiting.  Genitourinary: Negative for dysuria, flank pain and hematuria.  Musculoskeletal: Negative for back pain and joint pain.  Skin: Negative for rash.  Neurological: Negative for dizziness, sensory change, focal weakness, seizures, loss of consciousness, weakness and headaches.  Endo/Heme/Allergies: Negative for polydipsia.  Psychiatric/Behavioral: Negative for depression. The patient is not nervous/anxious.     DRUG ALLERGIES:   Allergies  Allergen Reactions  . Dust Mite Extract Shortness Of Breath and Itching  . Mold Extract [Trichophyton] Shortness Of Breath and Itching  . Feraheme [Ferumoxytol] Itching  . Pollen Extract   . Sulfa Antibiotics Itching   VITALS:  Blood pressure 136/72, pulse 79, temperature 98.4 F (36.9 C), temperature source Oral, resp. rate 16, height 5\' 4"  (1.626 m), weight (!) 398 lb (180.5 kg), last menstrual period 09/06/2016, SpO2 98 %. PHYSICAL EXAMINATION:  Physical Exam  Constitutional: She is oriented to person, place, and time and well-developed, well-nourished, and in no distress.  Morbid obesity  HENT:  Head:  Normocephalic.  Mouth/Throat: Oropharynx is clear and moist.  Eyes: Conjunctivae and EOM are normal. Pupils are equal, round, and reactive to light. No scleral icterus.  Neck: Normal range of motion. Neck supple. No JVD present. No tracheal deviation present.  Cardiovascular: Normal rate, regular rhythm and normal heart sounds. Exam reveals no gallop.  No murmur heard. Pulmonary/Chest: Effort normal. No respiratory distress. She has no wheezes. She has rales.  diminished lung sounds.  Abdominal: Soft. Bowel sounds are normal. She exhibits no distension. There is no tenderness. There is no rebound.  Musculoskeletal: She exhibits edema. She exhibits no tenderness.  Neurological: She is alert and oriented to person, place, and time. No cranial nerve deficit.  Skin: No rash noted. No erythema.  Psychiatric: Affect normal.   LABORATORY PANEL:  Female CBC Recent Labs  Lab 03/25/17 0412  WBC 8.5  HGB 8.2*  HCT 26.7*  PLT 267   ------------------------------------------------------------------------------------------------------------------ Chemistries  Recent Labs  Lab 03/25/17 0412  NA 136  K 3.7  CL 94*  CO2 33*  GLUCOSE 218*  BUN 58*  CREATININE 2.63*  CALCIUM 7.8*  MG 1.3*   RADIOLOGY:  No results found. ASSESSMENT AND PLAN:   *Acute on chronic diastolic congestive heart failure Patient is on high dose of torsemide at home.   Continue Lasix drip.  Monitor input and output.  Follow BMP.  Replace potassium and magnesium as needed.   Per Dr. Saunders Revel, continue Lasix drip and metolazone 5 mg daily.  *Acute on chronic respiratory failure with tachypnea.  Due to CHF.   Oxygen [place] 4 L  *Hypertension.  Continue home medications.  IV hydralazine added.  *Insulin-dependent diabetes mellitus.  Lantus at lower dose.  Sliding scale insulin. Takes Tresiba 38 units at home.  *Elevated troponin likely due to CHF. No further ischemia workup at this time.  *CKD stage IV.  Monitor  creatinine while being diuresed.  Hypomagnesemia.  Given IV magnesium in the follow-up level.  PT evaluation suggest SNF. All the records are reviewed and case discussed with Care Management/Social Worker. Management plans discussed with the patient, her husband and they are in agreement.  CODE STATUS: Full Code  TOTAL TIME TAKING CARE OF THIS PATIENT: 37 minutes.   More than 50% of the time was spent in counseling/coordination of care: YES  POSSIBLE D/C IN 3 DAYS, DEPENDING ON CLINICAL CONDITION.   Demetrios Loll M.D on 03/25/2017 at 6:00 PM  Between 7am to 6pm - Pager - [phone]  After 6pm go to [email or web address] - [facility]

## 2017-03-26 DIAGNOSIS — Z515 Encounter for palliative care: Secondary | ICD-10-CM

## 2017-03-26 DIAGNOSIS — D500 Iron deficiency anemia secondary to blood loss (chronic): Secondary | ICD-10-CM

## 2017-03-26 DIAGNOSIS — E1165 Type 2 diabetes mellitus with hyperglycemia: Secondary | ICD-10-CM

## 2017-03-26 DIAGNOSIS — E662 Morbid (severe) obesity with alveolar hypoventilation: Secondary | ICD-10-CM

## 2017-03-26 DIAGNOSIS — R0602 Shortness of breath: Secondary | ICD-10-CM

## 2017-03-26 DIAGNOSIS — R748 Abnormal levels of other serum enzymes: Secondary | ICD-10-CM

## 2017-03-26 LAB — BASIC METABOLIC PANEL
Anion gap: 8 (ref 5–15)
BUN: 61 mg/dL — AB (ref 6–20)
CALCIUM: 8.1 mg/dL — AB (ref 8.9–10.3)
CHLORIDE: 94 mmol/L — AB (ref 101–111)
CO2: 35 mmol/L — ABNORMAL HIGH (ref 22–32)
CREATININE: 2.82 mg/dL — AB (ref 0.44–1.00)
GFR calc Af Amer: 22 mL/min — ABNORMAL LOW (ref >60)
GFR, EST NON AFRICAN AMERICAN: 19 mL/min — AB (ref >60)
Glucose, Bld: 167 mg/dL — ABNORMAL HIGH (ref 65–99)
Potassium: 3.8 mmol/L (ref 3.5–5.1)
SODIUM: 137 mmol/L (ref 135–145)

## 2017-03-26 LAB — GLUCOSE, CAPILLARY
GLUCOSE-CAPILLARY: 143 mg/dL — AB (ref 65–99)
GLUCOSE-CAPILLARY: 168 mg/dL — AB (ref 65–99)
Glucose-Capillary: 139 mg/dL — ABNORMAL HIGH (ref 65–99)
Glucose-Capillary: 156 mg/dL — ABNORMAL HIGH (ref 65–99)

## 2017-03-26 LAB — MAGNESIUM: MAGNESIUM: 1.8 mg/dL (ref 1.7–2.4)

## 2017-03-26 MED ORDER — HYDROCERIN EX CREA
TOPICAL_CREAM | Freq: Two times a day (BID) | CUTANEOUS | Status: DC
  Administered 2017-03-26 – 2017-04-26 (×47): via TOPICAL
  Administered 2017-04-26: 1 via TOPICAL
  Administered 2017-04-27 – 2017-04-28 (×2): via TOPICAL
  Filled 2017-03-26 (×3): qty 113

## 2017-03-26 MED ORDER — DIPHENHYDRAMINE HCL 25 MG PO CAPS
25.0000 mg | ORAL_CAPSULE | Freq: Three times a day (TID) | ORAL | Status: DC | PRN
  Administered 2017-03-26 – 2017-04-23 (×35): 25 mg via ORAL
  Filled 2017-03-26 (×34): qty 1

## 2017-03-26 MED ORDER — DIPHENHYDRAMINE HCL 50 MG/ML IJ SOLN
12.5000 mg | Freq: Once | INTRAMUSCULAR | Status: AC
  Administered 2017-03-26: 12.5 mg via INTRAVENOUS
  Filled 2017-03-26: qty 1

## 2017-03-26 NOTE — Progress Notes (Signed)
Patient requesting benadryl for itching.  She received a one time dose last night and it really helped her.  Paged Dr. Darvin Neighbours

## 2017-03-26 NOTE — Progress Notes (Signed)
Pt moved to bari bed.

## 2017-03-26 NOTE — Progress Notes (Signed)
Progress Note  Patient Name: [name] Date of Encounter: 03/26/2017  Primary Cardiologist: [facility]  Subjective   Improving shortness of breath on Lasix infusion, still not at her baseline Husband at the bedside, Poor diet in general, large volume fluids daily When questioned about medication compliance she reports that she has too many medications though is compliant with her diuretic   Inpatient Medications    Scheduled Meds: . aspirin EC  81 mg Oral Daily  . atorvastatin  80 mg Oral q1800  . citalopram  40 mg Oral Daily  . cloNIDine  0.2 mg Oral BID  . heparin  5,000 Units Subcutaneous Q8H  . hydrALAZINE  100 mg Oral Q8H  . insulin aspart  0-15 Units Subcutaneous TID WC  . insulin aspart  0-5 Units Subcutaneous QHS  . insulin glargine  12 Units Subcutaneous QHS  . losartan  50 mg Oral Daily  . metolazone  5 mg Oral Daily  . metoprolol tartrate  100 mg Oral BID  . potassium chloride  10 mEq Oral BID  . sodium chloride flush  3 mL Intravenous Q12H  . sodium chloride flush  3 mL Intravenous Q12H   Continuous Infusions: . sodium chloride    . furosemide (LASIX) infusion 10 mg/hr (03/26/17 0310)   PRN Meds: sodium chloride, acetaminophen **OR** acetaminophen, albuterol, cyclobenzaprine, diphenhydrAMINE, ondansetron **OR** ondansetron (ZOFRAN) IV, polyethylene glycol, sodium chloride, sodium chloride flush   Vital Signs    Vitals:   03/26/17 0352 03/26/17 0402 03/26/17 0803 03/26/17 0826  BP: 136/80  130/77 139/75  Pulse: 78  74 75  Resp: 19  18   Temp: 98.1 F (36.7 C)  (!) 97.5 F (36.4 C)   TempSrc: Oral  Oral   SpO2: 96% 100% 100%   Weight: (!) 395 lb (179.2 kg)     Height:        Intake/Output Summary (Last 24 hours) at 03/26/2017 1204 Last data filed at 03/26/2017 0700 Gross per 24 hour  Intake 317.16 ml  Output 1350 ml  Net -1032.84 ml   Filed Weights   03/24/17 1717 03/25/17 0935 03/26/17 0352  Weight: (!) 398 lb (180.5 kg) (!) 397 lb 4.3 oz  (180.2 kg) (!) 395 lb (179.2 kg)    Telemetry    Normal sinus rhythm- Personally Reviewed  ECG      Physical Exam   Physical Exam: General: Morbidly obese, well developed, well nourished, in no acute distress. Unable to sit up Head: Normocephalic, atraumatic, sclera non-icteric, no xanthomas, nares without discharge.  Neck: Negative for carotid bruits. Unable to estimate JVP Lungs: Diminished breath sounds bilaterally Breathing is unlabored. Heart: RRR with S1 S2. No murmurs, rubs, or gallops appreciated. Abdomen: Soft, non-tender, non-distended with normoactive bowel sounds. No hepatomegaly. No rebound/guarding. No obvious abdominal masses. Msk:  Strength and tone appear normal for age. Extremities: No clubbing or cyanosis. 1+ pitting edema.  Neuro: Alert and oriented X 3. No facial asymmetry. No focal deficit. Moves all extremities spontaneously. Psych:  Responds to questions appropriately with a normal affect.    Labs    Chemistry Recent Labs  Lab 03/24/17 1715 03/25/17 0412 03/26/17 0339  NA 139 136 137  K 3.5 3.7 3.8  CL 94* 94* 94*  CO2 35* 33* 35*  GLUCOSE 171* 218* 167*  BUN 59* 58* 61*  CREATININE 2.65* 2.63* 2.82*  CALCIUM 8.1* 7.8* 8.1*  GFRNONAA 21* 21* 19*  GFRAA 24* 24* 22*  ANIONGAP 10 9 8  Hematology Recent Labs  Lab 03/24/17 1715 03/25/17 0412  WBC 9.1 8.5  RBC 3.44* 3.18*  HGB 8.9* 8.2*  HCT 28.6* 26.7*  MCV 83.1 84.0  MCH 25.9* 25.9*  MCHC 31.2* 30.8*  RDW 16.2* 15.9*  PLT 304 267    Cardiac Enzymes Recent Labs  Lab 03/24/17 1715 03/24/17 2345 03/25/17 0412  TROPONINI 0.18* 0.15* 0.17*   No results for input(s): TROPIPOC in the last 168 hours.   BNPNo results for input(s): BNP, PROBNP in the last 168 hours.   DDimer No results for input(s): DDIMER in the last 168 hours.   Radiology    Dg Chest 2 View  Result Date: 03/24/2017 CLINICAL DATA:  Shortness of Breath EXAM: CHEST  2 VIEW COMPARISON:  03/08/2017 FINDINGS:  Marked cardiomegaly. Large right pleural effusion with right lower lobe atelectasis, stable. No confluent opacity or effusion on the left. No acute bony abnormality. IMPRESSION: Marked cardiomegaly. Large right pleural effusion with right lower lobe atelectasis, stable. Electronically Signed   By: Rolm Baptise M.D.   On: 03/24/2017 18:03    Cardiac Studies     Patient Profile     [name] is a 47 y.o. female with a hx of chronic diastolic heart failure, chronic kidney disease stage III-IV, HTN, DM, iron deficiency anemia, and morbid obesity with suspected OSA/OHS,  acute on chronic diastolic CHF    Assessment & Plan     1. Acute on diastolic CHF: Lasix infusion IV diuresis gtt to 10 mg/hr -Likely in the setting of medication and dietary noncompliance with underlying morbid obesity possible OSA/OHS, high fluid intake, poorly controlled HTN -will need paramedicine as she is unable to get out of the house [facility] nurse visits if patient will allow in the home, Lasix IV in the home if possible At discharge will need torsemide 100 BID, Metolazone as needed based on weight at home  2. CKD stage III: Poor function, stable Not a good candidate for dialysis if renal function worsens  3. Anemia: May benefit from renal consult/ consideration of epogen/procrit Anemia will make chf sx worse  4. Accelerated HTN: -Improved on continuing home medications -Suspect noncompliance  -Per IM    Total encounter time more than 35 minutes  Greater than 50% was spent in counseling and coordination of care with the patient  Case discussed with hospitalist service, nursing on 2A, case management For questions or updates, please contact [place] Please consult [email or web address] for contact info under Cardiology/STEMI.      Signed, Ida Rogue, MD  03/26/2017, 12:04 PM

## 2017-03-26 NOTE — Plan of Care (Addendum)
Patient refused to take her blood pressure medications. Tried to explain to patient the importance of medication adherence. Patient does understand disease progress, continue to re-educate patient and family about heart failure. Patient refuses to engage in any activity of self care.

## 2017-03-26 NOTE — Progress Notes (Signed)
[facility] at [place] NAME: [name]    MR#:  [id number]  DATE OF BIRTH:  02-Nov-1970  SUBJECTIVE:  CHIEF COMPLAINT:   Chief Complaint  Patient presents with  . Shortness of Breath  . Congestive Heart Failure   Still LE edema and SOB. Feels weak. Husband at bedside REVIEW OF SYSTEMS:  Review of Systems  Constitutional: Negative for chills, fever and malaise/fatigue.  HENT: Negative for sore throat.   Eyes: Negative for blurred vision and double vision.  Respiratory: Positive for cough, sputum production and shortness of breath. Negative for hemoptysis, wheezing and stridor.   Cardiovascular: Positive for leg swelling. Negative for chest pain, palpitations and orthopnea.  Gastrointestinal: Negative for abdominal pain, blood in stool, diarrhea, melena, nausea and vomiting.  Genitourinary: Negative for dysuria, flank pain and hematuria.  Musculoskeletal: Negative for back pain and joint pain.  Skin: Negative for rash.  Neurological: Negative for dizziness, sensory change, focal weakness, seizures, loss of consciousness, weakness and headaches.  Endo/Heme/Allergies: Negative for polydipsia.  Psychiatric/Behavioral: Negative for depression. The patient is not nervous/anxious.     DRUG ALLERGIES:   Allergies  Allergen Reactions  . Dust Mite Extract Shortness Of Breath and Itching  . Mold Extract [Trichophyton] Shortness Of Breath and Itching  . Feraheme [Ferumoxytol] Itching  . Pollen Extract   . Sulfa Antibiotics Itching   VITALS:  Blood pressure 139/75, pulse 75, temperature (!) 97.5 F (36.4 C), temperature source Oral, resp. rate 18, height 5\' 4"  (1.626 m), weight (!) 179.2 kg (395 lb), last menstrual period 09/06/2016, SpO2 100 %. PHYSICAL EXAMINATION:  Physical Exam  Constitutional: She is oriented to person, place, and time and well-developed, well-nourished, and in no distress.  Morbid obesity  HENT:  Head:  Normocephalic.  Mouth/Throat: Oropharynx is clear and moist.  Eyes: Conjunctivae and EOM are normal. Pupils are equal, round, and reactive to light. No scleral icterus.  Neck: Normal range of motion. Neck supple. No JVD present. No tracheal deviation present.  Cardiovascular: Normal rate, regular rhythm and normal heart sounds. Exam reveals no gallop.  No murmur heard. Pulmonary/Chest: Effort normal. No respiratory distress. She has no wheezes. She has rales.  diminished lung sounds.  Abdominal: Soft. Bowel sounds are normal. She exhibits no distension. There is no tenderness. There is no rebound.  Musculoskeletal: She exhibits edema. She exhibits no tenderness.  Neurological: She is alert and oriented to person, place, and time. No cranial nerve deficit.  Skin: No rash noted. No erythema.  Psychiatric: Affect normal.   LABORATORY PANEL:  Female CBC Recent Labs  Lab 03/25/17 0412  WBC 8.5  HGB 8.2*  HCT 26.7*  PLT 267   ------------------------------------------------------------------------------------------------------------------ Chemistries  Recent Labs  Lab 03/26/17 0339  NA 137  K 3.8  CL 94*  CO2 35*  GLUCOSE 167*  BUN 61*  CREATININE 2.82*  CALCIUM 8.1*  MG 1.8   RADIOLOGY:  No results found. ASSESSMENT AND PLAN:   *Acute on chronic diastolic congestive heart failure Diuresing with lasix drip On Metolazone Monitor I/Os Counseled to restrict Salt and fluids  *Acute on chronic respiratory failure .  Due to CHF.   Oxygen [place] 3 L  *Hypertension.  Continue home medications.  *Insulin-dependent diabetes mellitus.   Lantus at lower dose. Sliding scale insulin. Takes Tresiba 38 units at home.  *Elevated troponin likely due to CHF. No further ischemia workup at this time.  *CKD stage IV.  Monitor creatinine while being  diuresed.  Discussed with Dr. Rockey Situ  All the records are reviewed and case discussed with Care Management/Social Worker. Management  plans discussed with the patient, her husband and they are in agreement.  CODE STATUS: Full Code  TOTAL TIME TAKING CARE OF THIS PATIENT: 35 minutes.   POSSIBLE D/C IN 2-3 DAYS, DEPENDING ON CLINICAL CONDITION.  Leia Alf Hansen Carino M.D on 03/26/2017 at 2:18 PM  Between 7am to 6pm - Pager - [phone]  After 6pm go to [email or web address] - [facility]

## 2017-03-26 NOTE — Consult Note (Signed)
Consultation Note Date: 03/26/2017   Patient Name: [name]  DOB: November 21, 1970  MRN: [id number]  Age / Sex: 47 y.o., female  PCP: [name], NP Referring Physician: Hillary Bow, MD  Reason for Consultation: Establishing goals of care  HPI/Patient Profile: 47 y.o. female  with past medical history of diastolic heart failure and chronic kidney disease (stg 3-4), morbid obesity, COPD, uncontrolled DM, OSA, accelerated HTN who was admitted on 03/24/2017 from the heart failure clinic with decompensated heart failure and shortness of breath.  In the hospital she is being followed by cardiology and is currently on a lasix gtt with metolazone.  She has intermittently refused BP medications.   Clinical Assessment and Goals of Care:  I have reviewed medical records including EPIC notes, labs and imaging, received report from the care team, assessed the patient and then met at the bedside along with her husband  to discuss diagnosis prognosis, GOC, EOL wishes, disposition and options.  I introduced Palliative Medicine as specialized medical care for people living with serious illness. It focuses on providing relief from the symptoms and stress of a serious illness. The goal is to improve quality of life for both the patient and the family.  We discussed a brief life review of the patient.  She worked as a [employer]ar[employer] manager.  She and her husband have 4 children ages 40 - 76.  From her description the children are all well educated and doing well.  She has 1 grandchild.  She is a religious person - raised in the [employer].  Alayssa and her husband are facing severe socio-economic difficulties.  She has applied for medicaid but it has not yet gone thru.  Her husband used to receive SSI but that was discontinued when he started working.  Now he has been laid off.  They have received an eviction notice on their home.  As far  as functional and nutritional status she is barely able to stand.  She took 2 small steps today with physical therapy at 2+ mod -max assist.  She is able to eat only small amounts at one time due to her volume status.  She tries to eat chicken and tuna for protein. She complains that food does not taste right.  Ms. Sugar tells me that she takes too many pills - more specifically she takes too many pills at once.  She states her BP will drop to 90/50 and she feels like she is going to pass out  We discussed her current illness and what it means in the larger context of her on-going co-morbidities.  Natural disease trajectory and expectations at EOL were discussed.  Specifically we discussed her D-CHF, COPD, morbid obesity and worsening CKD.  Tiandra understands that if her kidneys fail there will not be a way to reduce the fluid build up caused by heart failure and COPD.  She feels her kidneys are worsening and that worries her.  We discussed code status.  Shaquela firmly wants to be a full code.  I attempted to start a conversation about minimum acceptable quality of life - but Jeyda shut down.  I offered an advanced directive conversation with the Chaplain, but Aren politely refused.   Alissandra is open to all avenues to continue life.  She states she would undergo bariatric surgery if that would help her.  Questions and concerns were addressed.  The family was encouraged to call with questions or concerns.    Primary Decision Maker:  PATIENT    SUMMARY OF RECOMMENDATIONS    Eucerin to skin Bariatric bed at home (DME) Patient unwilling to discuss Advanced Directives, firm about full code.   Palliative care has nothing else to offer this admission.  Please reconsult PMT on next admission.  Code Status/Advance Care Planning:  Full   Psycho-social/Spiritual:   Desire for further Chaplaincy support: yes requested   Prognosis: poor.  Patient with advanced heart disease becoming more resistant to  diuretics, COPD, malnutrition (albumin 2.6), worsening kidney failure, recurrent hospitalizations.  A prognosis of less than 6 months would not be surprising.  She is at high risk for an acute event.    Discharge Planning: Home with Home Health      Primary Diagnoses: Present on Admission: . Chronic diastolic heart failure ([place]) . Hypertensive heart disease with congestive heart failure ([place]) . Morbid obesity ([place]) . Shortness of breath . Iron deficiency anemia . Poorly controlled type 2 diabetes mellitus ([place]) . CKD (chronic kidney disease), stage III ([place]) . Obesity hypoventilation syndrome ([place])   I have reviewed the medical record, interviewed the patient and family, and examined the patient. The following aspects are pertinent.  Past Medical History:  Diagnosis Date  . (HFpEF) heart failure with preserved ejection fraction ([place])    a. 11/2014 Echo: EF 50-55%, Gr1 DD; b. 06/2016 Echo: EF 60-65%, no rwma, mod dil LA, nl RV, triv eff.  . Asthma   . CHF (congestive heart failure) ([place])   . CKD (chronic kidney disease), stage III ([place])   . COPD (chronic obstructive pulmonary disease) ([place])    a. Home O2.  . Diabetes mellitus without complication ([place])   . Hypertension   . Iron deficiency anemia    a. chronic blood loss->heavy menses.  . Morbid obesity ([place])   . Pericardial effusion    a. 11/2014 CT Chest: Mod effusion; b. 11/2014 Echo: small to mod circumferential effusion. No hemodynamic compromise.  [name] Poorly controlled diabetes mellitus ([place])    a. 11/2014 A1c 13.2.   Social History   Socioeconomic History  . Marital status: Married    Spouse name: None  . Number of children: None  . Years of education: 19  . Highest education level: High school graduate  Social Needs  . Financial resource strain: Very hard  . Food insecurity - worry: Often true  . Food insecurity - inability: Often true  . Transportation needs - medical: Yes  . Transportation needs -  non-medical: Yes  Occupational History  . Occupation: Disabled  Tobacco Use  . Smoking status: Never Smoker  . Smokeless tobacco: Never Used  Substance and Sexual Activity  . Alcohol use: Yes    Alcohol/week: 0.0 oz    Comment: occ drink - <1/wk  . Drug use: No  . Sexual activity: None  Other Topics Concern  . None  Social History Narrative   Lives in [place] with her husband and 27 y/o child.  Three other children are grown and out of the house.  She is sedentary  and does not routinely exercise.  On home O2.   Family History  Problem Relation Age of Onset  . Diabetes Mother   . Hypertension Mother   . Hypertension Father   . Diabetes Father   . Hypertension Unknown   . Diabetes Unknown   . Breast cancer Maternal Aunt 56  . Breast cancer Maternal Aunt 50   Scheduled Meds: . aspirin EC  81 mg Oral Daily  . atorvastatin  80 mg Oral q1800  . citalopram  40 mg Oral Daily  . cloNIDine  0.2 mg Oral BID  . heparin  5,000 Units Subcutaneous Q8H  . hydrALAZINE  100 mg Oral Q8H  . insulin aspart  0-15 Units Subcutaneous TID WC  . insulin aspart  0-5 Units Subcutaneous QHS  . insulin glargine  12 Units Subcutaneous QHS  . losartan  50 mg Oral Daily  . metolazone  5 mg Oral Daily  . metoprolol tartrate  100 mg Oral BID  . potassium chloride  10 mEq Oral BID  . sodium chloride flush  3 mL Intravenous Q12H  . sodium chloride flush  3 mL Intravenous Q12H   Continuous Infusions: . sodium chloride    . furosemide (LASIX) infusion 10 mg/hr (03/26/17 0310)   PRN Meds:.sodium chloride, acetaminophen **OR** acetaminophen, albuterol, cyclobenzaprine, diphenhydrAMINE, ondansetron **OR** ondansetron (ZOFRAN) IV, polyethylene glycol, sodium chloride, sodium chloride flush Allergies  Allergen Reactions  . Dust Mite Extract Shortness Of Breath and Itching  . Mold Extract [Trichophyton] Shortness Of Breath and Itching  . Feraheme [Ferumoxytol] Itching  . Pollen Extract   . Sulfa  Antibiotics Itching   Review of Systems complains of stomach distension, inability to eat, SOB, weakness, itching, leg swelling. She denies changes in bowel habits, CP, vomiting  Physical Exam  Morbidly obese female, lying in bed, smiling, coherent, cooperative. CV rrr resp no distress Abdomen massive firm, nt, nd   Vital Signs: BP 139/75 (BP Location: Left Arm)   Pulse 75   Temp (!) 97.5 F (36.4 C) (Oral)   Resp 18   Ht 5' 4"  (1.626 m)   Wt (!) 179.2 kg (395 lb)   LMP 09/06/2016 (Approximate)   SpO2 100%   BMI 67.80 kg/m  Pain Assessment: No/denies pain   Pain Score: 0-No pain   SpO2: SpO2: 100 % O2 Device:SpO2: 100 % O2 Flow Rate: .O2 Flow Rate (L/min): 4 L/min  IO: Intake/output summary:   Intake/Output Summary (Last 24 hours) at 03/26/2017 1415 Last data filed at 03/26/2017 0700 Gross per 24 hour  Intake 317.16 ml  Output 1350 ml  Net -1032.84 ml    LBM: Last BM Date: 03/25/17 Baseline Weight: Weight: (!) 180.5 kg (398 lb) Most recent weight: Weight: (!) 179.2 kg (395 lb)     Palliative Assessment/Data: 30%     Time In: 2:15 Time Out: 3:23 Time Total: 67 Greater than 50%  of this time was spent counseling and coordinating care related to the above assessment and plan.  Signed by: Florentina Jenny, PA-C Palliative Medicine Pager: [phone]  Please contact Palliative Medicine Team phone at [phone] for questions and concerns.  For individual provider: See Shea Evans

## 2017-03-26 NOTE — Progress Notes (Signed)
Patient asked for prayer, but was visibly upset about life stress and her health. Chaplain prayed with the patient.  Patient appeared to calm down.

## 2017-03-27 DIAGNOSIS — N183 Chronic kidney disease, stage 3 (moderate): Secondary | ICD-10-CM

## 2017-03-27 LAB — GLUCOSE, CAPILLARY
GLUCOSE-CAPILLARY: 109 mg/dL — AB (ref 65–99)
GLUCOSE-CAPILLARY: 106 mg/dL — AB (ref 65–99)
GLUCOSE-CAPILLARY: 222 mg/dL — AB (ref 65–99)
Glucose-Capillary: 142 mg/dL — ABNORMAL HIGH (ref 65–99)

## 2017-03-27 LAB — BASIC METABOLIC PANEL
Anion gap: 10 (ref 5–15)
BUN: 62 mg/dL — AB (ref 6–20)
CALCIUM: 8.4 mg/dL — AB (ref 8.9–10.3)
CHLORIDE: 94 mmol/L — AB (ref 101–111)
CO2: 35 mmol/L — ABNORMAL HIGH (ref 22–32)
CREATININE: 2.83 mg/dL — AB (ref 0.44–1.00)
GFR, EST AFRICAN AMERICAN: 22 mL/min — AB (ref >60)
GFR, EST NON AFRICAN AMERICAN: 19 mL/min — AB (ref >60)
Glucose, Bld: 122 mg/dL — ABNORMAL HIGH (ref 65–99)
Potassium: 4.1 mmol/L (ref 3.5–5.1)
SODIUM: 139 mmol/L (ref 135–145)

## 2017-03-27 LAB — BRAIN NATRIURETIC PEPTIDE: B NATRIURETIC PEPTIDE 5: 577 pg/mL — AB (ref 0.0–100.0)

## 2017-03-27 MED ORDER — ATORVASTATIN CALCIUM 20 MG PO TABS
80.0000 mg | ORAL_TABLET | Freq: Every day | ORAL | Status: DC
  Administered 2017-03-27 – 2017-04-27 (×31): 80 mg via ORAL
  Filled 2017-03-27 (×31): qty 4

## 2017-03-27 MED ORDER — TORSEMIDE 100 MG PO TABS
100.0000 mg | ORAL_TABLET | Freq: Two times a day (BID) | ORAL | Status: DC
  Administered 2017-03-27 – 2017-04-01 (×11): 100 mg via ORAL
  Filled 2017-03-27 (×13): qty 1

## 2017-03-27 NOTE — Progress Notes (Addendum)
Progress Note  Patient Name: [name] Date of Encounter: 03/27/2017  Primary Cardiologist: Rockey Situ  Subjective   SOB somewhat improved this morning. Renal function remains above baseline, though stable at 2.83. Off Lasix gtt this morning. Minus 1.8 L for the past 24 hours and net - 2.8 L for the admission. No weight today. BNP 577. Remains on supplemental oxygen via nasal cannula at 4 L. Notes dry mouth. Has soup by the bed. Husband providing water when she asks.   Inpatient Medications    Scheduled Meds: . aspirin EC  81 mg Oral Daily  . atorvastatin  80 mg Oral q1800  . citalopram  40 mg Oral Daily  . cloNIDine  0.2 mg Oral BID  . heparin  5,000 Units Subcutaneous Q8H  . hydrALAZINE  100 mg Oral Q8H  . hydrocerin   Topical BID  . insulin aspart  0-15 Units Subcutaneous TID WC  . insulin aspart  0-5 Units Subcutaneous QHS  . insulin glargine  12 Units Subcutaneous QHS  . losartan  50 mg Oral Daily  . metolazone  5 mg Oral Daily  . metoprolol tartrate  100 mg Oral BID  . potassium chloride  10 mEq Oral BID  . sodium chloride flush  3 mL Intravenous Q12H  . sodium chloride flush  3 mL Intravenous Q12H  . torsemide  100 mg Oral BID   Continuous Infusions: . sodium chloride     PRN Meds: sodium chloride, acetaminophen **OR** acetaminophen, albuterol, cyclobenzaprine, diphenhydrAMINE, ondansetron **OR** ondansetron (ZOFRAN) IV, polyethylene glycol, sodium chloride, sodium chloride flush   Vital Signs    Vitals:   03/26/17 2007 03/26/17 2105 03/27/17 0350 03/27/17 0756  BP: 133/72  121/74 129/81  Pulse: 89  79 77  Resp: 20  20   Temp: 98.2 F (36.8 C)  98 F (36.7 C)   TempSrc: Oral  Oral   SpO2: 97% 98% 99% 99%  Weight:      Height:        Intake/Output Summary (Last 24 hours) at 03/27/2017 1115 Last data filed at 03/27/2017 0404 Gross per 24 hour  Intake 82.67 ml  Output 1900 ml  Net -1817.33 ml   Filed Weights   03/24/17 1717 03/25/17 0935 03/26/17  0352  Weight: (!) 398 lb (180.5 kg) (!) 397 lb 4.3 oz (180.2 kg) (!) 395 lb (179.2 kg)    Telemetry    NSR - Personally Reviewed  ECG    n/a - Personally Reviewed  Physical Exam   GEN: No acute distress; Morbidly obese.   Neck: JVD difficult to assess 2/2 body habitus. Cardiac: RRR, II/VI systolic murmur at the apex, no rubs, or gallops.  Respiratory: Diminished breath sounds bilaterally.  GI: Soft, nontender, non-distended.   MS: 3+ pitting edema; No deformity. Neuro:  Alert and oriented x 3; Nonfocal.  Psych: Normal affect.  Labs    Chemistry Recent Labs  Lab 03/25/17 0412 03/26/17 0339 03/27/17 0702  NA 136 137 139  K 3.7 3.8 4.1  CL 94* 94* 94*  CO2 33* 35* 35*  GLUCOSE 218* 167* 122*  BUN 58* 61* 62*  CREATININE 2.63* 2.82* 2.83*  CALCIUM 7.8* 8.1* 8.4*  GFRNONAA 21* 19* 19*  GFRAA 24* 22* 22*  ANIONGAP 9 8 10      Hematology Recent Labs  Lab 03/24/17 1715 03/25/17 0412  WBC 9.1 8.5  RBC 3.44* 3.18*  HGB 8.9* 8.2*  HCT 28.6* 26.7*  MCV 83.1 84.0  MCH  25.9* 25.9*  MCHC 31.2* 30.8*  RDW 16.2* 15.9*  PLT 304 267    Cardiac Enzymes Recent Labs  Lab 03/24/17 1715 03/24/17 2345 03/25/17 0412  TROPONINI 0.18* 0.15* 0.17*   No results for input(s): TROPIPOC in the last 168 hours.   BNP Recent Labs  Lab 03/27/17 0702  BNP 577.0*     DDimer No results for input(s): DDIMER in the last 168 hours.   Radiology    No results found.  Cardiac Studies   TTE 10/2016: Study Conclusions  - Left ventricle: The cavity size was mildly dilated. Systolic   function was normal. The estimated ejection fraction was 55%.   Wall motion was normal; there were no regional wall motion   abnormalities. Doppler parameters are consistent with abnormal   left ventricular relaxation (grade 1 diastolic dysfunction). - Mitral valve: There was mild regurgitation. - Atrial septum: No defect or patent foramen ovale was identified.  Impressions:  - Normal  LVEF with mild to moderate MR and mild diastolic   dysfunction.  Patient Profile     47 y.o. female with history of chronic diastolic heart failure, chronic kidney diseasestage III-IV, HTN, DM, iron deficiency anemia,noncompliance, and morbid obesitywith suspected OSA/OHS who was admitted with acute on chronic diastolic CHF.  Assessment & Plan    1. Acute respiratory failure: -Likely multifactorial including acute on chronic diastolic CHF, possible pulmonary hypertension, morbid obesity with suspected OHS/OSA -Remains on supplemental oxygen at 4 L via nasal cannula -Not on BiPAP or CPAP at home, ? If this would help  2. Acute on chronic diastolic CHF: -Continues to appear volume overloaded -BNP may be falsely low given her body habitus  -Now off Lasix gtt -Consider resuming IV Lasix pushes, await input from MD -Consider prn metolazone -Likely exacerbated in the setting of noncompliance  -Renal function continues to may diuresis very difficult and we will be unlikely to fully diurese her  3. Acute on CKD stage III-IV: -Followed by nephrology as an outpatient, missed her last appointment -Uncertain if she would be a candidate for dialysis, even with worsening renal function, as she has not ambulated in ~ 3 years, had to call the fire department to get her out of her house for her CHF clinic visit on 1/28. Unlikely she would be able to go to dialysis center. Unlikely to be a candidate for home HD or PD -Would consider having nephrology weigh in on the above  4. Anemia of chronic disease: -Down trending over the past couple of months -No recent cbc, consider checking -May benefit from Epogen/iron -Defer to IM  5. HTN: -Controlled  6. Morbid obesity/suspected OHS/OSA/deconditoning/noncompliance: -Playing a large role in the above  For questions or updates, please contact [place] Please consult [email or web address] for contact info under Cardiology/STEMI.    Signed, Christell Faith, PA-C [place] Pager: [phone] 03/27/2017, 11:15 AM   Attending Note Patient seen and examined, agree with detailed note above,  Patient presentation and plan discussed on rounds.   Supine in bed, no interest in getting out of bed Declined PT Lasix infusion held this morning, started on torsemide 100 twice daily  On physical exam difficult to examine as she will not move around the bed, unable to estimate JVD, lungs clear on the left, heart sounds regular no murmurs appreciated abdomen morbidly obese trace pitting chronic lower extremity edema  Lab work reviewed showing climbing BUN 62, creatinine stable 2.83, potassium 4.1, BNP 577  A/P Difficult situation in  this morbidly obese woman who has no desire to get out of bed, mobilize to take her medications -Seen by palliative already  given her current situation outlook is poor Would likely develop upper respiratory issues, bedsores, etc. Already 3 person assist to get out of bed Unable to manage at home. notes indicating possible eviction from their house --Social services may need to get involved, It would do well with placement in a facility that can handle her size but she does not have insurance High risk of recurrent admission given her size, underlying medical issues and inability to take her medications and care for herself --Possible depression?  Consider psychiatry consult  Long discussion with hospitalist service, nursing concerning various treatment options Greater than 50% was spent in counseling and coordination of care with patient Total encounter time 35 minutes or more   Signed: Esmond Plants  M.D., Ph.D. [facility]

## 2017-03-27 NOTE — Plan of Care (Signed)
Voiding via external urinary device per request due to immobility.  Up to bedside commode earlier in shift;max assist X 3 people required to get patient up and return to bed.  Pt declines to get out of bed for weight. (Bed scales reading negative pounds).

## 2017-03-27 NOTE — Progress Notes (Signed)
PT Cancellation Note  Patient Details Name: AVAYA MCJUNKINS MRN: [id number] DOB: 11-Mar-1970   Cancelled Treatment:    Reason Eval/Treat Not Completed: Patient declined PT services this date secondary to fatigue from "already being up today" to [facility].  Will attempt to see pt at a future date/time as appropriate.       Linus Salmons PT, DPT 03/27/17, 11:24 AM

## 2017-03-27 NOTE — Progress Notes (Signed)
[place] at [place] NAME: [name]    MR#:  [id number]  DATE OF BIRTH:  07/20/1970  SUBJECTIVE:  CHIEF COMPLAINT:   Chief Complaint  Patient presents with  . Shortness of Breath  . Congestive Heart Failure   Still has SOB and weakness. Husband at bedside Afebrile REVIEW OF SYSTEMS:  Review of Systems  Constitutional: Negative for chills, fever and malaise/fatigue.  HENT: Negative for sore throat.   Eyes: Negative for blurred vision and double vision.  Respiratory: Positive for cough, sputum production and shortness of breath. Negative for hemoptysis, wheezing and stridor.   Cardiovascular: Positive for leg swelling. Negative for chest pain, palpitations and orthopnea.  Gastrointestinal: Negative for abdominal pain, blood in stool, diarrhea, melena, nausea and vomiting.  Genitourinary: Negative for dysuria, flank pain and hematuria.  Musculoskeletal: Negative for back pain and joint pain.  Skin: Negative for rash.  Neurological: Negative for dizziness, sensory change, focal weakness, seizures, loss of consciousness, weakness and headaches.  Endo/Heme/Allergies: Negative for polydipsia.  Psychiatric/Behavioral: Negative for depression. The patient is not nervous/anxious.     DRUG ALLERGIES:   Allergies  Allergen Reactions  . Dust Mite Extract Shortness Of Breath and Itching  . Mold Extract [Trichophyton] Shortness Of Breath and Itching  . Feraheme [Ferumoxytol] Itching  . Pollen Extract   . Sulfa Antibiotics Itching   VITALS:  Blood pressure 129/81, pulse 77, temperature 98 F (36.7 C), temperature source Oral, resp. rate 20, height 5\' 4"  (1.626 m), weight (!) 179.2 kg (395 lb), last menstrual period 09/06/2016, SpO2 99 %. PHYSICAL EXAMINATION:  Physical Exam  Constitutional: She is oriented to person, place, and time and well-developed, well-nourished, and in no distress.  Morbid obesity  HENT:  Head: Normocephalic.    Mouth/Throat: Oropharynx is clear and moist.  Eyes: Conjunctivae and EOM are normal. Pupils are equal, round, and reactive to light. No scleral icterus.  Neck: Normal range of motion. Neck supple. No JVD present. No tracheal deviation present.  Cardiovascular: Normal rate, regular rhythm and normal heart sounds. Exam reveals no gallop.  No murmur heard. Pulmonary/Chest: Effort normal. No respiratory distress. She has no wheezes. She has rales.  diminished lung sounds.  Abdominal: Soft. Bowel sounds are normal. She exhibits no distension. There is no tenderness. There is no rebound.  Musculoskeletal: She exhibits edema. She exhibits no tenderness.  Neurological: She is alert and oriented to person, place, and time. No cranial nerve deficit.  Skin: No rash noted. No erythema.  Psychiatric: Affect normal.   LABORATORY PANEL:  Female CBC Recent Labs  Lab 03/25/17 0412  WBC 8.5  HGB 8.2*  HCT 26.7*  PLT 267   ------------------------------------------------------------------------------------------------------------------ Chemistries  Recent Labs  Lab 03/26/17 0339 03/27/17 0702  NA 137 139  K 3.8 4.1  CL 94* 94*  CO2 35* 35*  GLUCOSE 167* 122*  BUN 61* 62*  CREATININE 2.82* 2.83*  CALCIUM 8.1* 8.4*  MG 1.8  --    RADIOLOGY:  No results found. ASSESSMENT AND PLAN:   *Acute on chronic diastolic congestive heart failure Stop lasix drip and change to torsemide BID PO 100 mg On Metolazone Monitor I/Os Counseled to restrict Salt and fluids - compliance is a problem  *Acute on chronic respiratory failure .  Due to CHF.   Oxygen [place] 4 L  *Hypertension.  Continue home medications.  *Insulin-dependent diabetes mellitus.   Lantus Sliding scale insulin.  *Elevated troponin likely due to CHF. No  further ischemia workup at this time.  *CKD stage IV.  Monitor creatinine while being diuresed.  Discussed with Dr. Rockey Situ  Poor prognosis with morbid obesity, chf, non  compliance.  Not a candidate for bariatric surgery with her cardiac status  Likely d/c to SNF  All the records are reviewed and case discussed with Care Management/Social Worker. Management plans discussed with the patient, her husband and they are in agreement.  CODE STATUS: Full Code  TOTAL TIME TAKING CARE OF THIS PATIENT: 35 minutes.   POSSIBLE D/C IN 2-3 DAYS, DEPENDING ON CLINICAL CONDITION.  Leia Alf Nadeen Shipman M.D on 03/27/2017 at 12:08 PM  Between 7am to 6pm - Pager - [phone]  After 6pm go to [email or web address] - [facility]

## 2017-03-28 ENCOUNTER — Encounter: Payer: Self-pay | Admitting: Student

## 2017-03-28 LAB — BASIC METABOLIC PANEL
Anion gap: 10 (ref 5–15)
BUN: 63 mg/dL — AB (ref 6–20)
CALCIUM: 8.6 mg/dL — AB (ref 8.9–10.3)
CO2: 35 mmol/L — AB (ref 22–32)
CREATININE: 2.82 mg/dL — AB (ref 0.44–1.00)
Chloride: 93 mmol/L — ABNORMAL LOW (ref 101–111)
GFR calc Af Amer: 22 mL/min — ABNORMAL LOW (ref >60)
GFR calc non Af Amer: 19 mL/min — ABNORMAL LOW (ref >60)
Glucose, Bld: 172 mg/dL — ABNORMAL HIGH (ref 65–99)
Potassium: 4.1 mmol/L (ref 3.5–5.1)
Sodium: 138 mmol/L (ref 135–145)

## 2017-03-28 LAB — CBC
HCT: 27.3 % — ABNORMAL LOW (ref 35.0–47.0)
Hemoglobin: 8.3 g/dL — ABNORMAL LOW (ref 12.0–16.0)
MCH: 25.6 pg — AB (ref 26.0–34.0)
MCHC: 30.3 g/dL — AB (ref 32.0–36.0)
MCV: 84.5 fL (ref 80.0–100.0)
PLATELETS: 269 10*3/uL (ref 150–440)
RBC: 3.23 MIL/uL — ABNORMAL LOW (ref 3.80–5.20)
RDW: 16.8 % — AB (ref 11.5–14.5)
WBC: 7.5 10*3/uL (ref 3.6–11.0)

## 2017-03-28 LAB — MAGNESIUM: Magnesium: 1.7 mg/dL (ref 1.7–2.4)

## 2017-03-28 LAB — GLUCOSE, CAPILLARY
Glucose-Capillary: 157 mg/dL — ABNORMAL HIGH (ref 65–99)
Glucose-Capillary: 137 mg/dL — ABNORMAL HIGH (ref 65–99)
Glucose-Capillary: 154 mg/dL — ABNORMAL HIGH (ref 65–99)
Glucose-Capillary: 172 mg/dL — ABNORMAL HIGH (ref 65–99)

## 2017-03-28 NOTE — Progress Notes (Signed)
Progress Note  Patient Name: [name] Date of Encounter: 03/28/2017  Primary Cardiologist: [place] like her breathing is improved and better than when she arrived this admission, though not as improved as her recent duischarge. Cough productive of green sputum. Documented UOP for the past 24 hours of 1 L. Net - 3.8 L for the admission. Weight 361 pounds by bed scale with an admission weight of 398 pounds on 1/28. Renal function remains up, though stable at 2.82. HGB 8.3, stable. Back on PO torsemide 100 mg bid with metolazone 5 mg daily as of 1/31.   Inpatient Medications    Scheduled Meds: . aspirin EC  81 mg Oral Daily  . atorvastatin  80 mg Oral q1800  . citalopram  40 mg Oral Daily  . cloNIDine  0.2 mg Oral BID  . heparin  5,000 Units Subcutaneous Q8H  . hydrALAZINE  100 mg Oral Q8H  . hydrocerin   Topical BID  . insulin aspart  0-15 Units Subcutaneous TID WC  . insulin aspart  0-5 Units Subcutaneous QHS  . insulin glargine  12 Units Subcutaneous QHS  . losartan  50 mg Oral Daily  . metolazone  5 mg Oral Daily  . metoprolol tartrate  100 mg Oral BID  . potassium chloride  10 mEq Oral BID  . sodium chloride flush  3 mL Intravenous Q12H  . sodium chloride flush  3 mL Intravenous Q12H  . torsemide  100 mg Oral BID   Continuous Infusions: . sodium chloride     PRN Meds: sodium chloride, acetaminophen **OR** acetaminophen, albuterol, cyclobenzaprine, diphenhydrAMINE, ondansetron **OR** ondansetron (ZOFRAN) IV, polyethylene glycol, sodium chloride, sodium chloride flush   Vital Signs    Vitals:   03/27/17 2309 03/28/17 0326 03/28/17 0435 03/28/17 0747  BP:  (!) 153/81  (!) 150/83  Pulse:  78  80  Resp:  20  14  Temp:  97.7 F (36.5 C)  97.6 F (36.4 C)  TempSrc:  Oral  Oral  SpO2: 100% 95% 94% 99%  Weight:  (!) 361 lb (163.7 kg)    Height:        Intake/Output Summary (Last 24 hours) at 03/28/2017 0916 Last data filed at 03/28/2017  0333 Gross per 24 hour  Intake -  Output 1000 ml  Net -1000 ml   Filed Weights   03/25/17 0935 03/26/17 0352 03/28/17 0326  Weight: (!) 397 lb 4.3 oz (180.2 kg) (!) 395 lb (179.2 kg) (!) 361 lb (163.7 kg)    Telemetry    NSR, 7 beats NSVT - Personally Reviewed  ECG    n/a - Personally Reviewed  Physical Exam   GEN: No acute distress.   Neck: JVD difficult to assess 2/2 body habitus. Cardiac: RRR, no murmurs, rubs, or gallops.  Respiratory: Diminsihed breath sounds bilaterally (anterior exam given bed-bound status).  GI: Soft, nontender, non-distended.   MS: 2-3+ bilateral pitting edema with chronic woody appearance; No deformity. Neuro:  Alert and oriented x 3; Nonfocal.  Psych: Normal affect.  Labs    Chemistry Recent Labs  Lab 03/26/17 0339 03/27/17 0702 03/28/17 0728  NA 137 139 138  K 3.8 4.1 4.1  CL 94* 94* 93*  CO2 35* 35* 35*  GLUCOSE 167* 122* 172*  BUN 61* 62* 63*  CREATININE 2.82* 2.83* 2.82*  CALCIUM 8.1* 8.4* 8.6*  GFRNONAA 19* 19* 19*  GFRAA 22* 22* 22*  ANIONGAP 8 10 10  Hematology Recent Labs  Lab 03/24/17 1715 03/25/17 0412 03/28/17 0728  WBC 9.1 8.5 7.5  RBC 3.44* 3.18* 3.23*  HGB 8.9* 8.2* 8.3*  HCT 28.6* 26.7* 27.3*  MCV 83.1 84.0 84.5  MCH 25.9* 25.9* 25.6*  MCHC 31.2* 30.8* 30.3*  RDW 16.2* 15.9* 16.8*  PLT 304 267 269    Cardiac Enzymes Recent Labs  Lab 03/24/17 1715 03/24/17 2345 03/25/17 0412  TROPONINI 0.18* 0.15* 0.17*   No results for input(s): TROPIPOC in the last 168 hours.   BNP Recent Labs  Lab 03/27/17 0702  BNP 577.0*     DDimer No results for input(s): DDIMER in the last 168 hours.   Radiology    No results found.  Cardiac Studies   TTE 10/2016: Study Conclusions  - Left ventricle: The cavity size was mildly dilated. Systolic   function was normal. The estimated ejection fraction was 55%.   Wall motion was normal; there were no regional wall motion   abnormalities. Doppler parameters  are consistent with abnormal   left ventricular relaxation (grade 1 diastolic dysfunction). - Mitral valve: There was mild regurgitation. - Atrial septum: No defect or patent foramen ovale was identified.  Impressions:  - Normal LVEF with mild to moderate MR and mild diastolic   dysfunction.  Patient Profile     47 y.o. female with history of chronic diastolic heart failure, chronic kidney diseasestage III-IV, HTN, DM, iron deficiency anemia,noncompliance, and morbid obesitywith suspected OSA/OHS who was admitted with acute on chronic diastolic CHF.  Assessment & Plan    1. Acute respiratory failure: -Likely multifactorial including acute on chronic diastolic CHF, possible pulmonary hypertension, morbid obesity with suspected OHS/OSA -Remains on supplemental oxygen at 4 L via nasal cannula -Not on BiPAP or CPAP at home, ? If this would help  2. Acute on chronic diastolic CHF: -Continues to appear volume overloaded -BNP may be falsely low given her body habitus  -Now off Lasix gtt -Back on PO torsemide 100 mg bid with metolazone 5 mg daily -May need RHC to further assess her volume status, will discuss with MD -Likely exacerbated in the setting of noncompliance  -Renal function continues to may diuresis very difficult and we will be unlikely to fully diurese her  3. Acute on CKD stage III-IV: -Followed by nephrology as an outpatient, missed her last appointment -Uncertain if she would be a candidate for dialysis, even with worsening renal function, as she has not ambulated in ~ 3 years, had to call the fire department to get her out of her house for her CHF clinic visit on 1/28. Unlikely she would be able to go to dialysis center. Unlikely to be a candidate for home HD or PD -Would consider having nephrology weigh in on the above  4. Anemia of chronic disease/iron deficiency anemia: -Down trending over the past couple of months -No recent cbc, consider checking -May  benefit from Epogen/iron -Patient reports she is due for an iron infusion  -Consider hematology consult  5. HTN: -Mildly elevated into the 759F systolic this morning  6. Morbid obesity/suspected OHS/OSA/deconditoning/noncompliance: -Playing a large role in the above -Has been seen by Palliative Care with a suspected ~ 6 month prognosis   7. NSVT: -Magnesium 1.7 -Recent TSH normal -Potassium at goal  8. RLS: -Possibly related the above   For questions or updates, please contact [place] Please consult [email or web address] for contact info under Cardiology/STEMI.    Signed, Christell Faith, PA-C [place] Pager: [phone] 03/28/2017,  9:16 AM

## 2017-03-28 NOTE — Clinical Social Work Note (Signed)
CSW spoke with patient and discussed with her that PT is recommending SNF placement.  CSW informed her that because she does not have insurance it will be limited on where she is able to go for short term rehab.  CSW told patient that if she is accepted by a SNF [place].  Patient initially said she does not want to go out of county for SNF, however she did agree to see what SNFs if any are able to accept her.  CSW was given permission to begin bed search.  Patient stated that if she does not like any of the offers, she has agreed to return home with home health.  Jones Broom. Vincent, MSW, [place]  03/28/2017 5:26 PM

## 2017-03-28 NOTE — Clinical Social Work Note (Signed)
Clinical Social Work Assessment  Patient Details  Name: [name] MRN: [id number] Date of Birth: February 01, 1971  Date of referral:  03/28/17               Reason for consult:  Facility Placement                Permission sought to share information with:  Facility Sport and exercise psychologist, Family Supports Permission granted to share information::  Yes, Verbal Permission Granted  Name::     SNF admissions  Agency::     Relationship::     Contact Information:     [place], [place] [phone]     Housing/Transportation Living arrangements for the past 2 months:  Single Family Home Source of Information:  Patient Patient Interpreter Needed:  None Criminal Activity/Legal Involvement Pertinent to Current Situation/Hospitalization:  No - Comment as needed Significant Relationships:  Adult Children, Other Family Members, Spouse Lives with:  Spouse Do you feel safe going back to the place where you live?  No Need for family participation in patient care:  No (Coment)  Care giving concerns:  Patient and family feel she needs short term rehab before she is able to return back home.   Social Worker assessment / plan:  Patient is a 47 year old female who is alert and oriented x4, patient lives with her husband.  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  Patient was just discharged about 2 weeks ago to home with home health under charity care, however she was noncompliant with medications and home health.  Patient states she wants to walk again, but she has was not willing to participate with therapy.  Patient has now been trying to participate with therapy and is agreeable with going to SNF.  Patient was informed that most likely she will have to go out of county, patient states she does not want to go out of county, and would rather go home.  Patient did say that she will allow CSW to see if she gets any bed offers, and will decide if she is  going to go back home or will go to a SNF.  Patient states she has applied for Medicaid and Disability but is waiting for approval.  Patient was explained how difficult it will be to find a facility which will take someone who does not have insurance.  Patient expressed understanding.  Patient gave CSW permission to begin bed search in [place].  Employment status:  Disabled (Comment on whether or not currently receiving Disability) Insurance information:  Self Pay (Medicaid Pending) PT Recommendations:  [place] / Referral to community resources:  [place]  Patient/Family's Response to care:  Patient is agreeable to going to SNF for short term rehab.  Patient/Family's Understanding of and Emotional Response to Diagnosis, Current Treatment, and Prognosis:  Patient is aware of current treatment plan and prognosis.  Emotional Assessment Appearance:  Appears older than stated age Attitude/Demeanor/Rapport:    Affect (typically observed):  Appropriate, Calm, Stable Orientation:  Oriented to Self, Oriented to Place, Oriented to  Time, Oriented to Situation Alcohol / Substance use:  Not Applicable Psych involvement (Current and /or in the community):  No (Comment)  Discharge Needs  Concerns to be addressed:  Lack of Support Readmission within the last 30 days:  Yes(03-10-17 to home) Current discharge risk:  Inadequate Financial Supports, Lack of support system, Chronically ill Barriers to Discharge:  Inadequate or no insurance, Continued Medical Work up, No SNF bed   Anell Barr 03/28/2017, 5:59 PM

## 2017-03-28 NOTE — Plan of Care (Signed)
Up to side of bed this shift.  Unable to stand to use bedside commode. Dyspneic with minimal exertion.  Recovers with rest.

## 2017-03-28 NOTE — NC FL2 (Signed)
[place] LEVEL OF CARE SCREENING TOOL     IDENTIFICATION  Patient Name: [name] Birthdate: Jul 01, 1970 Sex: female Admission Date (Current Location): 03/24/2017  [place] and [place] Number:  Network engineer and Address:  [facility], [address] [place], [place] [address]      Provider Number: [id number]  Attending Physician Name and Address:  Hillary Bow, MD  Relative Name and Phone Number:  Sedra, Morfin [phone]     Current Level of Care: Hospital Recommended Level of Care: [place] Prior Approval Number:    Date Approved/Denied:   PASRR Number: [id number]  Discharge Plan: SNF    Current Diagnoses: Patient Active Problem List   Diagnosis Date Noted  . Palliative care encounter   . Obesity hypoventilation syndrome ([place]) 03/09/2017  . CHF (congestive heart failure) ([place]) 03/03/2017  . Chronic diastolic heart failure ([place]) 01/07/2017  . Pneumonia 01/07/2017  . Acute exacerbation of CHF (congestive heart failure) ([place]) 11/07/2016  . Depression 09/24/2016  . Acute on chronic diastolic heart failure ([place]) 07/04/2016  . COPD with chronic bronchitis ([place]) 07/04/2016  . CKD (chronic kidney disease), stage III ([place]) 06/26/2016  . COPD exacerbation ([place]) 03/25/2016  . Anxiety 10/02/2015  . Asthma 08/30/2015  . Poorly controlled type 2 diabetes mellitus ([place]) 03/13/2015  . Iron deficiency anemia 02/17/2015  . Morbid obesity ([place])   . Shortness of breath   . Essential hypertension   . Hypertensive heart disease with congestive heart failure ([place]) 12/18/2014  . Diabetes ([place]) 12/15/2014    Orientation RESPIRATION BLADDER Height & Weight     Self, Time, Situation, Place  O2(4L) Continent Weight: (!) 361 lb (163.7 kg)(with bed equ. removed ) Height:  5\' 4"  (162.6 cm)  BEHAVIORAL SYMPTOMS/MOOD NEUROLOGICAL BOWEL NUTRITION STATUS      Continent Diet  AMBULATORY STATUS  COMMUNICATION OF NEEDS Skin   Limited Assist Verbally Normal                       Personal Care Assistance Level of Assistance  Bathing, Feeding, Dressing Bathing Assistance: Limited assistance Feeding assistance: Independent Dressing Assistance: Limited assistance     Functional Limitations Info  Sight, Hearing, Speech Sight Info: Adequate Hearing Info: Adequate Speech Info: Adequate    SPECIAL CARE FACTORS FREQUENCY  PT (By licensed PT), OT (By licensed OT)     PT Frequency: 5x a week OT Frequency: 5x a week            Contractures Contractures Info: Not present    Additional Factors Info  Code Status, Allergies, Psychotropic, Insulin Sliding Scale   Contact precautions Code Status Info: Full Code   Psychotropic Info: citalopram (CELEXA) tablet 40 mg  Insulin Sliding Scale Info: insulin aspart (novoLOG) injection 0-15 Units 3x a day with meals    Contact precautions MRSA   Current Medications (03/28/2017):  This is the current hospital active medication list Current Facility-Administered Medications  Medication Dose Route Frequency Provider Last Rate Last Dose  . 0.9 %  sodium chloride infusion  250 mL Intravenous PRN Sudini, Alveta Heimlich, MD      . acetaminophen (TYLENOL) tablet 650 mg  650 mg Oral Q6H PRN Hillary Bow, MD   650 mg at 03/25/17 4503   Or  . acetaminophen (TYLENOL) suppository 650 mg  650 mg Rectal Q6H PRN Sudini, Alveta Heimlich, MD      . albuterol (PROVENTIL) (2.5 MG/3ML) 0.083% nebulizer solution 2.5 mg  2.5 mg  Nebulization Q2H PRN Hillary Bow, MD   2.5 mg at 03/28/17 0435  . aspirin EC tablet 81 mg  81 mg Oral Daily Sudini, Alveta Heimlich, MD   81 mg at 03/28/17 1100  . atorvastatin (LIPITOR) tablet 80 mg  80 mg Oral q1800 Hillary Bow, MD   80 mg at 03/27/17 2057  . citalopram (CELEXA) tablet 40 mg  40 mg Oral Daily Sudini, Srikar, MD      . cloNIDine (CATAPRES) tablet 0.2 mg  0.2 mg Oral BID Hillary Bow, MD   0.2 mg at 03/28/17 1100  .  cyclobenzaprine (FLEXERIL) tablet 7.5 mg  7.5 mg Oral TID PRN Lance Coon, MD   7.5 mg at 03/27/17 1341  . diphenhydrAMINE (BENADRYL) capsule 25 mg  25 mg Oral Q8H PRN Hillary Bow, MD   25 mg at 03/28/17 1111  . heparin injection 5,000 Units  5,000 Units Subcutaneous Q8H Sudini, Alveta Heimlich, MD   5,000 Units at 03/28/17 1457  . hydrALAZINE (APRESOLINE) tablet 100 mg  100 mg Oral Q8H Sudini, Srikar, MD   100 mg at 03/28/17 1456  . hydrocerin (EUCERIN) cream   Topical BID Dellinger, Haynes Dage L, PA-C      . insulin aspart (novoLOG) injection 0-15 Units  0-15 Units Subcutaneous TID WC Hillary Bow, MD   2 Units at 03/28/17 1300  . insulin aspart (novoLOG) injection 0-5 Units  0-5 Units Subcutaneous QHS Hillary Bow, MD   2 Units at 03/27/17 2107  . insulin glargine (LANTUS) injection 12 Units  12 Units Subcutaneous QHS Hillary Bow, MD   12 Units at 03/27/17 2059  . losartan (COZAAR) tablet 50 mg  50 mg Oral Daily Hillary Bow, MD   50 mg at 03/28/17 1100  . metolazone (ZAROXOLYN) tablet 5 mg  5 mg Oral Daily Hillary Bow, MD   5 mg at 03/28/17 1132  . metoprolol tartrate (LOPRESSOR) tablet 100 mg  100 mg Oral BID Hillary Bow, MD   100 mg at 03/26/17 2111  . ondansetron (ZOFRAN) tablet 4 mg  4 mg Oral Q6H PRN Hillary Bow, MD       Or  . ondansetron (ZOFRAN) injection 4 mg  4 mg Intravenous Q6H PRN Sudini, Srikar, MD      . polyethylene glycol (MIRALAX / GLYCOLAX) packet 17 g  17 g Oral Daily PRN Sudini, Srikar, MD      . potassium chloride (K-DUR) CR tablet 10 mEq  10 mEq Oral BID Hillary Bow, MD   10 mEq at 03/28/17 1100  . sodium chloride (OCEAN) 0.65 % nasal spray 1 spray  1 spray Each Nare PRN Demetrios Loll, MD   1 spray at 03/25/17 2206  . sodium chloride flush (NS) 0.9 % injection 3 mL  3 mL Intravenous Q12H Hillary Bow, MD   3 mL at 03/27/17 2107  . sodium chloride flush (NS) 0.9 % injection 3 mL  3 mL Intravenous Q12H Hillary Bow, MD   3 mL at 03/27/17 2100  . sodium  chloride flush (NS) 0.9 % injection 3 mL  3 mL Intravenous PRN Sudini, Alveta Heimlich, MD      . torsemide (DEMADEX) tablet 100 mg  100 mg Oral BID Hillary Bow, MD   100 mg at 03/28/17 1751     Discharge Medications: Please see discharge summary for a list of discharge medications.  Relevant Imaging Results:  Relevant Lab Results:   Additional Information SSN [id number]  Ross Ludwig, [place]

## 2017-03-28 NOTE — Progress Notes (Signed)
[place] at [place] NAME: [name]    MR#:  [id number]  DATE OF BIRTH:  01/26/1971  SUBJECTIVE:  CHIEF COMPLAINT:   Chief Complaint  Patient presents with  . Shortness of Breath  . Congestive Heart Failure   Still has SOB On 4 L o2 REVIEW OF SYSTEMS:  Review of Systems  Constitutional: Negative for chills, fever and malaise/fatigue.  HENT: Negative for sore throat.   Eyes: Negative for blurred vision and double vision.  Respiratory: Positive for cough, sputum production and shortness of breath. Negative for hemoptysis, wheezing and stridor.   Cardiovascular: Positive for leg swelling. Negative for chest pain, palpitations and orthopnea.  Gastrointestinal: Negative for abdominal pain, blood in stool, diarrhea, melena, nausea and vomiting.  Genitourinary: Negative for dysuria, flank pain and hematuria.  Musculoskeletal: Negative for back pain and joint pain.  Skin: Negative for rash.  Neurological: Negative for dizziness, sensory change, focal weakness, seizures, loss of consciousness, weakness and headaches.  Endo/Heme/Allergies: Negative for polydipsia.  Psychiatric/Behavioral: Negative for depression. The patient is not nervous/anxious.     DRUG ALLERGIES:   Allergies  Allergen Reactions  . Dust Mite Extract Shortness Of Breath and Itching  . Mold Extract [Trichophyton] Shortness Of Breath and Itching  . Feraheme [Ferumoxytol] Itching  . Pollen Extract   . Sulfa Antibiotics Itching   VITALS:  Blood pressure (!) 150/83, pulse 80, temperature 97.6 F (36.4 C), temperature source Oral, resp. rate 14, height 5\' 4"  (1.626 m), weight (!) 163.7 kg (361 lb), last menstrual period 09/06/2016, SpO2 99 %. PHYSICAL EXAMINATION:  Physical Exam  Constitutional: She is oriented to person, place, and time and well-developed, well-nourished, and in no distress.  Morbid obesity  HENT:  Head: Normocephalic.  Mouth/Throat: Oropharynx  is clear and moist.  Eyes: Conjunctivae and EOM are normal. Pupils are equal, round, and reactive to light. No scleral icterus.  Neck: Normal range of motion. Neck supple. No JVD present. No tracheal deviation present.  Cardiovascular: Normal rate, regular rhythm and normal heart sounds. Exam reveals no gallop.  No murmur heard. Pulmonary/Chest: Effort normal. No respiratory distress. She has no wheezes. She has rales.  diminished lung sounds.  Abdominal: Soft. Bowel sounds are normal. She exhibits no distension. There is no tenderness. There is no rebound.  Musculoskeletal: She exhibits edema. She exhibits no tenderness.  Neurological: She is alert and oriented to person, place, and time. No cranial nerve deficit.  Skin: No rash noted. No erythema.  Psychiatric: Affect normal.   LABORATORY PANEL:  Female CBC Recent Labs  Lab 03/28/17 0728  WBC 7.5  HGB 8.3*  HCT 27.3*  PLT 269   ------------------------------------------------------------------------------------------------------------------ Chemistries  Recent Labs  Lab 03/28/17 0728  NA 138  K 4.1  CL 93*  CO2 35*  GLUCOSE 172*  BUN 63*  CREATININE 2.82*  CALCIUM 8.6*  MG 1.7   RADIOLOGY:  No results found. ASSESSMENT AND PLAN:   *Acute on chronic diastolic congestive heart failure Stopped lasix drip and change to torsemide BID PO 100 mg On Metolazone Monitor I/Os Counseled to restrict Salt and fluids - compliance is a problem Still has SOB. Unable to start IV lasix due to worsening BUn/Cr  *Acute on chronic respiratory failure .  Due to CHF.   Oxygen [place] 4 L  *Hypertension.  Continue home medications.  *Insulin-dependent diabetes mellitus.   Lantus Sliding scale insulin.  *Elevated troponin likely due to CHF. No further ischemia workup  at this time.  *CKD stage IV.  Monitor creatinine while being diuresed.  Discussed with Dr. Rockey Situ  Poor prognosis with morbid obesity, chf, non compliance.  Not  a candidate for bariatric surgery with her cardiac status  Likely d/c to SNF  All the records are reviewed and case discussed with Care Management/Social Worker. Management plans discussed with the patient, her husband and they are in agreement.  CODE STATUS: Full Code  TOTAL TIME TAKING CARE OF THIS PATIENT: 35 minutes.   POSSIBLE D/C IN 2-3 DAYS, DEPENDING ON CLINICAL CONDITION.  Leia Alf Jobe Mutch M.D on 03/28/2017 at 12:58 PM  Between 7am to 6pm - Pager - [phone]  After 6pm go to [email or web address] - [place] [facility]

## 2017-03-28 NOTE — Care Management (Signed)
Spoke with patient and her husband.  CM asked patient"what are your goals."  Patient responds her goal is to walk.  CM discussed that home health physical therapy was ordered but she declined to participate and did not always allow staff to visit.  Discussed that it was her right to make her own decisions about how she wanted to participate in her plan of care. Discussed  that not taking her medications as ordered- although it is her right very possibly limits her ability to participate in any activity.  Says that she will consider skilled nursing "but only if it is in [employer].  CM discussed how this restriction - if a bed is found and can be paid for - puts a road block for a goal of being able to walk. CM discussed that if a skilled bed could be obtained and she declines the bed, to anticipate discharge home.  Have not found another home health agency that will accept patient.  Will try to reach out to [facility] to see if agency will give patient one more chance. she also asks for a hospital bed. [facility] will not be able to provide under charity care.  She also says she needs a ramp to she can leave her home. Have discussed possibility of the paramedic program with the CV nurse navigator to see if any services can be provided.  patient is essentially unable to leave her home without ems transport.  Her medicaid is still pending

## 2017-03-29 LAB — BASIC METABOLIC PANEL
ANION GAP: 13 (ref 5–15)
BUN: 63 mg/dL — ABNORMAL HIGH (ref 6–20)
CALCIUM: 8.4 mg/dL — AB (ref 8.9–10.3)
CO2: 35 mmol/L — ABNORMAL HIGH (ref 22–32)
Chloride: 89 mmol/L — ABNORMAL LOW (ref 101–111)
Creatinine, Ser: 2.64 mg/dL — ABNORMAL HIGH (ref 0.44–1.00)
GFR, EST AFRICAN AMERICAN: 24 mL/min — AB (ref >60)
GFR, EST NON AFRICAN AMERICAN: 21 mL/min — AB (ref >60)
GLUCOSE: 141 mg/dL — AB (ref 65–99)
Potassium: 4 mmol/L (ref 3.5–5.1)
SODIUM: 137 mmol/L (ref 135–145)

## 2017-03-29 LAB — GLUCOSE, CAPILLARY
GLUCOSE-CAPILLARY: 134 mg/dL — AB (ref 65–99)
GLUCOSE-CAPILLARY: 140 mg/dL — AB (ref 65–99)
Glucose-Capillary: 124 mg/dL — ABNORMAL HIGH (ref 65–99)
Glucose-Capillary: 148 mg/dL — ABNORMAL HIGH (ref 65–99)
Glucose-Capillary: 163 mg/dL — ABNORMAL HIGH (ref 65–99)

## 2017-03-29 NOTE — Progress Notes (Signed)
Physical Therapy Treatment Patient Details Name: [name] MRN: [id number] DOB: Dec 14, 1970 Today's Date: 03/29/2017    History of Present Illness Pt is a46 y.o.femalewith a known history of systolic CHF, hypertension, diabetes, morbid obesity who was recently discharged from the hospital returns from CHF clinic due to weight gain, worsening shortness of breath. Attempts were made to start IV Lasix through home health but were unsuccessful. Also due to her severe shortness of breath was sent to the emergency room. Here chest x-ray shows pulmonary edema and right effusion which is chronic. Patient has been compliant with medications, fluid restriction and salt restriction.  Assessment includes: Acute on chronic diastolic congestive heart failure, acute on chronic respiratory failure with tachypnea due to CHF, HTN, DM, elevated troponin likely due to CHF, and CKD stage IV.    PT Comments    Pt awake upon arrival scrolling on her phone.  Pt declined mobility training this session.  Stated she was in a new bed that makes it hard for her.  Stated last session she was scared of falling due to mattress and bed height.  She did agree to supine exercises as described below.  2 x 10 reps.  Pt with generally poor effort with exercises and kept eyes closed.  Stated she had taken Benadryl and that makes her sleepy.  Encouraged independent exercises during the day and she voiced understanding.    Follow Up Recommendations  SNF     Equipment Recommendations       Recommendations for Other Services       Precautions / Restrictions Precautions Precautions: Fall Restrictions Weight Bearing Restrictions: No    Mobility  Bed Mobility               General bed mobility comments: declined  Transfers                    Ambulation/Gait                 Stairs            Wheelchair Mobility    Modified Rankin (Stroke Patients Only)       Balance                                             Cognition Arousal/Alertness: Awake/alert Behavior During Therapy: WFL for tasks assessed/performed Overall Cognitive Status: Within Functional Limits for tasks assessed                                        Exercises Other Exercises Other Exercises: supine exercises  2 x 10 for ankle pumps, heel slides, ab/add and knee flexion ext.      General Comments        Pertinent Vitals/Pain Pain Assessment: Faces Faces Pain Scale: Hurts a little bit Pain Location: B knees.  "OK" Pain Descriptors / Indicators: Aching;Sore Pain Intervention(s): Limited activity within patient's tolerance    Home Living                      Prior Function            PT Goals (current goals can now be found in the care plan section)      Frequency  PT Plan Current plan remains appropriate    Co-evaluation              AM-PAC PT "6 Clicks" Daily Activity  Outcome Measure  Difficulty turning over in bed (including adjusting bedclothes, sheets and blankets)?: Unable Difficulty moving from lying on back to sitting on the side of the bed? : Unable Difficulty sitting down on and standing up from a chair with arms (e.g., wheelchair, bedside commode, etc,.)?: Unable Help needed moving to and from a bed to chair (including a wheelchair)?: A Lot Help needed walking in hospital room?: Total Help needed climbing 3-5 steps with a railing? : Total 6 Click Score: 7    End of Session Equipment Utilized During Treatment: Oxygen Activity Tolerance: Other (comment) Patient left: in bed;with bed alarm set;with call bell/phone within reach         Time: 9507-2257 PT Time Calculation (min) (ACUTE ONLY): 9 min  Charges:  $Therapeutic Exercise: 8-22 mins                    G Codes:       Chesley Noon, PTA 03/29/17, 10:36 AM

## 2017-03-29 NOTE — Progress Notes (Signed)
Patient has been refusing some of her medication. Educated patient the importance of taking her medication specially her cardiac meds and provided hand out. Patient agree to take her metoprolol and verbalized understanding. RN will continue to monitor.

## 2017-03-29 NOTE — Progress Notes (Signed)
[place] at [place] NAME: [name]    MR#:  [id number]  DATE OF BIRTH:  1970/06/11  SUBJECTIVE:  CHIEF COMPLAINT:   Chief Complaint  Patient presents with  . Shortness of Breath  . Congestive Heart Failure  Patient without complaint, husband at the bedside, awaiting placement  REVIEW OF SYSTEMS:  CONSTITUTIONAL: No fever, fatigue or weakness.  EYES: No blurred or double vision.  EARS, NOSE, AND THROAT: No tinnitus or ear pain.  RESPIRATORY: No cough, shortness of breath, wheezing or hemoptysis.  CARDIOVASCULAR: No chest pain, orthopnea, edema.  GASTROINTESTINAL: No nausea, vomiting, diarrhea or abdominal pain.  GENITOURINARY: No dysuria, hematuria.  ENDOCRINE: No polyuria, nocturia,  HEMATOLOGY: No anemia, easy bruising or bleeding SKIN: No rash or lesion. MUSCULOSKELETAL: No joint pain or arthritis.   NEUROLOGIC: No tingling, numbness, weakness.  PSYCHIATRY: No anxiety or depression.   ROS  DRUG ALLERGIES:   Allergies  Allergen Reactions  . Dust Mite Extract Shortness Of Breath and Itching  . Mold Extract [Trichophyton] Shortness Of Breath and Itching  . Feraheme [Ferumoxytol] Itching  . Pollen Extract   . Sulfa Antibiotics Itching    VITALS:  Blood pressure (!) 148/83, pulse 85, temperature 98 F (36.7 C), temperature source Oral, resp. rate 17, height 5\' 4"  (1.626 m), weight (!) 178.7 kg (394 lb), last menstrual period 09/06/2016, SpO2 99 %.  PHYSICAL EXAMINATION:  GENERAL:  47 y.o.-year-old patient lying in the bed with no acute distress.  EYES: Pupils equal, round, reactive to light and accommodation. No scleral icterus. Extraocular muscles intact.  HEENT: Head atraumatic, normocephalic. Oropharynx and nasopharynx clear.  NECK:  Supple, no jugular venous distention. No thyroid enlargement, no tenderness.  LUNGS: Normal breath sounds bilaterally, no wheezing, rales,rhonchi or crepitation. No use of accessory muscles  of respiration.  CARDIOVASCULAR: S1, S2 normal. No murmurs, rubs, or gallops.  ABDOMEN: Soft, nontender, nondistended. Bowel sounds present. No organomegaly or mass.  EXTREMITIES: No pedal edema, cyanosis, or clubbing.  NEUROLOGIC: Cranial nerves II through XII are intact. Muscle strength 5/5 in all extremities. Sensation intact. Gait not checked.  PSYCHIATRIC: The patient is alert and oriented x 3.  SKIN: No obvious rash, lesion, or ulcer.   Physical Exam LABORATORY PANEL:   CBC Recent Labs  Lab 03/28/17 0728  WBC 7.5  HGB 8.3*  HCT 27.3*  PLT 269   ------------------------------------------------------------------------------------------------------------------  Chemistries  Recent Labs  Lab 03/28/17 0728 03/29/17 0641  NA 138 137  K 4.1 4.0  CL 93* 89*  CO2 35* 35*  GLUCOSE 172* 141*  BUN 63* 63*  CREATININE 2.82* 2.64*  CALCIUM 8.6* 8.4*  MG 1.7  --    ------------------------------------------------------------------------------------------------------------------  Cardiac Enzymes Recent Labs  Lab 03/24/17 2345 03/25/17 0412  TROPONINI 0.15* 0.17*   ------------------------------------------------------------------------------------------------------------------  RADIOLOGY:  No results found.  ASSESSMENT AND PLAN:  *Acute on chronic diastolic congestive heart failure Stable  Transitioned off lasix drip, continue torsemide BID PO 100 mg, Metolazone, low-sodium diet with fluid restriction  *Acute on chronic respiratory failure Resolved Due to CHF.  Continue oxygen [place] 4 L  *Hypertension Stable on current regiment  *Insulin-dependent diabetes mellitus.  Stable on current regiment  *Elevated troponin likely due to CHF. No further ischemia workup at this time  *CKD stage IV Stable  Poor prognosis with morbid obesity, chf, non compliance. Not a candidate for bariatric surgery with her cardiac status Disposition to SNF when bed is  available  All the  records are reviewed and case discussed with Care Management/Social Workerr. Management plans discussed with the patient, family and they are in agreement.  CODE STATUS: full TOTAL TIME TAKING CARE OF THIS PATIENT: 40 minutes.     POSSIBLE D/C IN 2-3 DAYS, DEPENDING ON CLINICAL CONDITION.   Avel Peace Salary M.D on 03/29/2017   Between 7am to 6pm - Pager - [phone]  After 6pm go to [email or web address] - password [email or web address] [place] [facility]  Office  [phone]  CC: Primary care physician; [name], NP  Note: This dictation was prepared with Dragon dictation along with smaller phrase technology. Any transcriptional errors that result from this process are unintentional.

## 2017-03-30 LAB — GLUCOSE, CAPILLARY
GLUCOSE-CAPILLARY: 130 mg/dL — AB (ref 65–99)
GLUCOSE-CAPILLARY: 144 mg/dL — AB (ref 65–99)
Glucose-Capillary: 116 mg/dL — ABNORMAL HIGH (ref 65–99)
Glucose-Capillary: 129 mg/dL — ABNORMAL HIGH (ref 65–99)

## 2017-03-30 NOTE — Progress Notes (Signed)
Neb treatment given per patient/family request. Notified primary RN.

## 2017-03-30 NOTE — Progress Notes (Signed)
[facility] NAME: [name]    MR#:  [id number]  DATE OF BIRTH:  1970/05/12  SUBJECTIVE:  CHIEF COMPLAINT:   Chief Complaint  Patient presents with  . Shortness of Breath  . Congestive Heart Failure  Patient without complaint, awaiting placement  REVIEW OF SYSTEMS:  CONSTITUTIONAL: No fever, fatigue or weakness.  EYES: No blurred or double vision.  EARS, NOSE, AND THROAT: No tinnitus or ear pain.  RESPIRATORY: No cough, shortness of breath, wheezing or hemoptysis.  CARDIOVASCULAR: No chest pain, orthopnea, edema.  GASTROINTESTINAL: No nausea, vomiting, diarrhea or abdominal pain.  GENITOURINARY: No dysuria, hematuria.  ENDOCRINE: No polyuria, nocturia,  HEMATOLOGY: No anemia, easy bruising or bleeding SKIN: No rash or lesion. MUSCULOSKELETAL: No joint pain or arthritis.   NEUROLOGIC: No tingling, numbness, weakness.  PSYCHIATRY: No anxiety or depression.   ROS  DRUG ALLERGIES:   Allergies  Allergen Reactions  . Dust Mite Extract Shortness Of Breath and Itching  . Mold Extract [Trichophyton] Shortness Of Breath and Itching  . Feraheme [Ferumoxytol] Itching  . Pollen Extract   . Sulfa Antibiotics Itching    VITALS:  Blood pressure (!) 145/82, pulse 94, temperature 98.5 F (36.9 C), temperature source Oral, resp. rate 18, height 5\' 4"  (1.626 m), weight (!) 175.1 kg (386 lb), last menstrual period 09/06/2016, SpO2 97 %.  PHYSICAL EXAMINATION:  GENERAL:  47 y.o.-year-old patient lying in the bed with no acute distress.  EYES: Pupils equal, round, reactive to light and accommodation. No scleral icterus. Extraocular muscles intact.  HEENT: Head atraumatic, normocephalic. Oropharynx and nasopharynx clear.  NECK:  Supple, no jugular venous distention. No thyroid enlargement, no tenderness.  LUNGS: Normal breath sounds bilaterally, no wheezing, rales,rhonchi or crepitation. No use of accessory muscles of respiration.   CARDIOVASCULAR: S1, S2 normal. No murmurs, rubs, or gallops.  ABDOMEN: Soft, nontender, nondistended. Bowel sounds present. No organomegaly or mass.  EXTREMITIES: No pedal edema, cyanosis, or clubbing.  NEUROLOGIC: Cranial nerves II through XII are intact. Muscle strength 5/5 in all extremities. Sensation intact. Gait not checked.  PSYCHIATRIC: The patient is alert and oriented x 3.  SKIN: No obvious rash, lesion, or ulcer.   Physical Exam LABORATORY PANEL:   CBC Recent Labs  Lab 03/28/17 0728  WBC 7.5  HGB 8.3*  HCT 27.3*  PLT 269   ------------------------------------------------------------------------------------------------------------------  Chemistries  Recent Labs  Lab 03/28/17 0728 03/29/17 0641  NA 138 137  K 4.1 4.0  CL 93* 89*  CO2 35* 35*  GLUCOSE 172* 141*  BUN 63* 63*  CREATININE 2.82* 2.64*  CALCIUM 8.6* 8.4*  MG 1.7  --    ------------------------------------------------------------------------------------------------------------------  Cardiac Enzymes Recent Labs  Lab 03/24/17 2345 03/25/17 0412  TROPONINI 0.15* 0.17*   ------------------------------------------------------------------------------------------------------------------  RADIOLOGY:  No results found.  ASSESSMENT AND PLAN:  *Acute on chronic diastolic congestive heart failure Stable  Transitioned offlasix drip, continue torsemide BID PO 100 mg, Metolazone, low-sodium diet with fluid restriction  *Acute on chronic respiratory failure Resolved Due to CHF.  Continue oxygen [place] 4 L  *Hypertension Stable on current regiment  *Insulin-dependent diabetes mellitus.  Stable on current regiment  *Elevated troponin likely due to CHF. No further ischemia workup at this time  *CKD stage IV Stable  Poor prognosis with morbid obesity, chf, non compliance. Not a candidate for bariatric surgery with her cardiac status Disposition to SNF when bed is available   All the  records are reviewed and  case discussed with Care Management/Social Workerr. Management plans discussed with the patient, family and they are in agreement.  CODE STATUS: full  TOTAL TIME TAKING CARE OF THIS PATIENT: 35 minutes.     POSSIBLE D/C IN 1-3 DAYS, DEPENDING ON CLINICAL CONDITION.   Avel Peace Salary M.D on 03/30/2017   Between 7am to 6pm - Pager - [phone]  After 6pm go to [email or web address] - password [email or web address] [facility]  Office  [phone]  CC: Primary care physician; [name], NP  Note: This dictation was prepared with Dragon dictation along with smaller phrase technology. Any transcriptional errors that result from this process are unintentional.

## 2017-03-31 DIAGNOSIS — F4323 Adjustment disorder with mixed anxiety and depressed mood: Secondary | ICD-10-CM

## 2017-03-31 DIAGNOSIS — Z9114 Patient's other noncompliance with medication regimen: Secondary | ICD-10-CM

## 2017-03-31 DIAGNOSIS — G894 Chronic pain syndrome: Secondary | ICD-10-CM

## 2017-03-31 DIAGNOSIS — F341 Dysthymic disorder: Secondary | ICD-10-CM

## 2017-03-31 LAB — GLUCOSE, CAPILLARY
GLUCOSE-CAPILLARY: 181 mg/dL — AB (ref 65–99)
GLUCOSE-CAPILLARY: 163 mg/dL — AB (ref 65–99)
Glucose-Capillary: 146 mg/dL — ABNORMAL HIGH (ref 65–99)
Glucose-Capillary: 144 mg/dL — ABNORMAL HIGH (ref 65–99)

## 2017-03-31 MED ORDER — ALBUTEROL SULFATE (2.5 MG/3ML) 0.083% IN NEBU
2.5000 mg | INHALATION_SOLUTION | RESPIRATORY_TRACT | Status: DC | PRN
  Administered 2017-03-31: 2.5 mg via RESPIRATORY_TRACT
  Filled 2017-03-31: qty 3

## 2017-03-31 MED ORDER — IPRATROPIUM-ALBUTEROL 0.5-2.5 (3) MG/3ML IN SOLN
3.0000 mL | RESPIRATORY_TRACT | Status: DC
  Administered 2017-03-31 – 2017-04-05 (×29): 3 mL via RESPIRATORY_TRACT
  Filled 2017-03-31 (×29): qty 3

## 2017-03-31 MED ORDER — LEVALBUTEROL HCL 0.63 MG/3ML IN NEBU
0.6300 mg | INHALATION_SOLUTION | Freq: Four times a day (QID) | RESPIRATORY_TRACT | Status: DC | PRN
Start: 2017-03-31 — End: 2017-03-31
  Administered 2017-03-31 (×2): 0.63 mg via RESPIRATORY_TRACT
  Filled 2017-03-31 (×2): qty 3

## 2017-03-31 MED ORDER — PREGABALIN 75 MG PO CAPS
75.0000 mg | ORAL_CAPSULE | Freq: Two times a day (BID) | ORAL | Status: DC
  Administered 2017-03-31 – 2017-04-03 (×6): 75 mg via ORAL
  Filled 2017-03-31 (×6): qty 1

## 2017-03-31 NOTE — Care Management (Signed)
Review of physical therapy notes : 1/29- few steps in place. 1/31- too tired, 2/2- declined mobility training.  CM has spoken with with patient about participating.  there have been attempts to find a SNF for placement but there are no offers.

## 2017-03-31 NOTE — Progress Notes (Signed)
Patient requesting breathing treatment every 2-3 hours. Complaining of shortness of breath. Lungs sounds diminished and clear. MD notified. New order placed. Will monitor.

## 2017-03-31 NOTE — Progress Notes (Signed)
[facility] [place] NAME: [name]    MR#:  [id number]  DATE OF BIRTH:  1971/01/28  SUBJECTIVE:  CHIEF COMPLAINT:   Chief Complaint  Patient presents with  . Shortness of Breath  . Congestive Heart Failure  Patient without complaint, per nursing staff patient has been refusing her antidepressant medication, husband is at the bedside, awaiting placement  REVIEW OF SYSTEMS:  CONSTITUTIONAL: No fever, fatigue or weakness.  EYES: No blurred or double vision.  EARS, NOSE, AND THROAT: No tinnitus or ear pain.  RESPIRATORY: No cough, shortness of breath, wheezing or hemoptysis.  CARDIOVASCULAR: No chest pain, orthopnea, edema.  GASTROINTESTINAL: No nausea, vomiting, diarrhea or abdominal pain.  GENITOURINARY: No dysuria, hematuria.  ENDOCRINE: No polyuria, nocturia,  HEMATOLOGY: No anemia, easy bruising or bleeding SKIN: No rash or lesion. MUSCULOSKELETAL: No joint pain or arthritis.   NEUROLOGIC: No tingling, numbness, weakness.  PSYCHIATRY: No anxiety or depression.   ROS  DRUG ALLERGIES:   Allergies  Allergen Reactions  . Dust Mite Extract Shortness Of Breath and Itching  . Mold Extract [Trichophyton] Shortness Of Breath and Itching  . Feraheme [Ferumoxytol] Itching  . Pollen Extract   . Sulfa Antibiotics Itching    VITALS:  Blood pressure (!) 154/92, pulse 84, temperature 98.3 F (36.8 C), temperature source Oral, resp. rate 18, height 5\' 4"  (1.626 m), weight (!) 171.5 kg (378 lb), last menstrual period 09/06/2016, SpO2 98 %.  PHYSICAL EXAMINATION:  GENERAL:  47 y.o.-year-old patient lying in the bed with no acute distress.  EYES: Pupils equal, round, reactive to light and accommodation. No scleral icterus. Extraocular muscles intact.  HEENT: Head atraumatic, normocephalic. Oropharynx and nasopharynx clear.  NECK:  Supple, no jugular venous distention. No thyroid enlargement, no tenderness.  LUNGS: Normal breath sounds  bilaterally, no wheezing, rales,rhonchi or crepitation. No use of accessory muscles of respiration.  CARDIOVASCULAR: S1, S2 normal. No murmurs, rubs, or gallops.  ABDOMEN: Soft, nontender, nondistended. Bowel sounds present. No organomegaly or mass.  EXTREMITIES: No pedal edema, cyanosis, or clubbing.  NEUROLOGIC: Cranial nerves II through XII are intact. Muscle strength 5/5 in all extremities. Sensation intact. Gait not checked.  PSYCHIATRIC: The patient is alert and oriented x 3.  SKIN: No obvious rash, lesion, or ulcer.   Physical Exam LABORATORY PANEL:   CBC Recent Labs  Lab 03/28/17 0728  WBC 7.5  HGB 8.3*  HCT 27.3*  PLT 269   ------------------------------------------------------------------------------------------------------------------  Chemistries  Recent Labs  Lab 03/28/17 0728 03/29/17 0641  NA 138 137  K 4.1 4.0  CL 93* 89*  CO2 35* 35*  GLUCOSE 172* 141*  BUN 63* 63*  CREATININE 2.82* 2.64*  CALCIUM 8.6* 8.4*  MG 1.7  --    ------------------------------------------------------------------------------------------------------------------  Cardiac Enzymes Recent Labs  Lab 03/24/17 2345 03/25/17 0412  TROPONINI 0.15* 0.17*   ------------------------------------------------------------------------------------------------------------------  RADIOLOGY:  No results found.  ASSESSMENT AND PLAN:  *Acute on chronic diastolic congestive heart failure Stable  Transitioned offlasix drip, continuetorsemide BID PO 100 mg,Metolazone,low-sodium diet with fluid restriction  *Acute on chronic respiratory failure Resolved Due to CHF.  Continue oxygen [place] 4 L  *Hypertension Stable on current regiment  *Insulin-dependent diabetes mellitus. Stable on current regiment  *Elevated troponin likely due to CHF. No further ischemia workup at this time  *CKD stage IV Stable  Poor prognosis with morbid obesity, chf, non compliance. Not a candidate  for bariatric surgery with her cardiac status Disposition toSNFwhen bed is  available    All the records are reviewed and case discussed with Care Management/Social Workerr. Management plans discussed with the patient, family and they are in agreement.  CODE STATUS: full  TOTAL TIME TAKING CARE OF THIS PATIENT: 35 minutes.     POSSIBLE D/C IN 1-3 DAYS, DEPENDING ON CLINICAL CONDITION.   Avel Peace Salary M.D on 03/31/2017   Between 7am to 6pm - Pager - [phone]  After 6pm go to [email or web address] - password [email or web address] [facility]  Office  [phone]  CC: Primary care physician; [name], NP  Note: This dictation was prepared with Dragon dictation along with smaller phrase technology. Any transcriptional errors that result from this process are unintentional.

## 2017-03-31 NOTE — Care Management (Addendum)
Reached out to para medicine resource at [facility].  Program is unable to provide any assistance to this patient. have reached out again to [facility] to see if will consider accepting patient back under service. FL2 was sent out 03/28/2017.  No bed offers. Discussed whether psych consult would be of any benefit. Attending says it will not be of benefit in this case.

## 2017-03-31 NOTE — Clinical Social Work Note (Addendum)
CSW has faxed patient's clinicals to out of county SNFs as well as in county.  Patient has been faxed to 15 SNFs and 13 have denied her, and 3 are still pending.  Patient does not have any insurance which is a barrier with trying to have patient find a SNF that is willing to accept her.  CSW continuing to look for bed placement.  Jones Broom. Merica Prell, MSW, [place]  03/31/2017 3:00 PM

## 2017-03-31 NOTE — Progress Notes (Signed)
MD Salary made aware of patient requesting breathing treatments every 2-3 hours. She has 2 PRN treatments that she has been getting around the clock. Per Dr. Jerelyn Charles, change  prn treatments to scheduled treatments. Will continue to monitor patient.

## 2017-03-31 NOTE — Consult Note (Signed)
[place] [facility] Consult   Reason for Consult: Consult for 47 year old woman with multiple chronic medical problems because of concern that she is not taking her antidepressant Referring Physician:  Salary Patient Identification: [name] MRN:  [id number] Principal Diagnosis: Hypertensive heart disease with congestive heart failure [facility]) Diagnosis:   Patient Active Problem List   Diagnosis Date Noted  . Adjustment disorder with mixed anxiety and depressed mood [F43.23] 03/31/2017  . Dysthymia [F34.1] 03/31/2017  . Chronic pain syndrome [G89.4] 03/31/2017  . Palliative care encounter [Z51.5]   . Obesity hypoventilation syndrome ([place]) [E66.2] 03/09/2017  . CHF (congestive heart failure) ([place]) [I50.9] 03/03/2017  . Chronic diastolic heart failure ([place]) [I50.32] 01/07/2017  . Pneumonia [J18.9] 01/07/2017  . Acute exacerbation of CHF (congestive heart failure) ([place]) [I50.9] 11/07/2016  . Depression [F32.9] 09/24/2016  . Acute on chronic diastolic heart failure ([place]) [I50.33] 07/04/2016  . COPD with chronic bronchitis ([place]) [J44.9] 07/04/2016  . CKD (chronic kidney disease), stage III ([place]) [N18.3] 06/26/2016  . COPD exacerbation ([place]) [J44.1] 03/25/2016  . Anxiety [F41.9] 10/02/2015  . Asthma [J45.909] 08/30/2015  . Poorly controlled type 2 diabetes mellitus ([place]) [E11.65] 03/13/2015  . Iron deficiency anemia [D50.9] 02/17/2015  . Morbid obesity ([place]) [E66.01]   . Shortness of breath [R06.02]   . Essential hypertension [I10]   . Hypertensive heart disease with congestive heart failure ([place]) [I11.0] 12/18/2014  . Diabetes ([place]) [E11.9] 12/15/2014    Total Time spent with patient: 1 hour  Subjective:   [name] is a 47 y.o. female patient admitted with "I had too much fluid".  HPI: Patient interviewed chart reviewed.  Spoke with her husband as well.  60 year old woman with congestive heart failure COPD multiple severe medical problems.  Consult was requested  because she is not taking her citalopram.  Patient tells me that she is choosing to not take it because she does not think that it is working anymore and that it had been making her feel jittery.  She says that she had stopped taking it a couple months ago at home.  On the other hand she does admit that she can notice a return of some irritability and short temperedness when she is not on it which is what she was taking it for in the first place.  She does not report being severely depressed.  There is no hopelessness no suicidal ideation.  Chronic difficulty sleeping related to medical problems.  No hallucinations or psychosis no substance abuse.  Chronic pain and discomfort as well.  Medical history: Patient is morbidly obese and has congestive heart failure COPD diabetes multiple severe chronic medical problems.  Social history: Lives at home with her husband and an 72 year old daughter.  Substance abuse history: Denies any alcohol or drug abuse nothing in the chart.  Past Psychiatric History: Patient has been treated by her primary care doctor over the years for her irritability.  She had been on the Celexa for quite a while.  She says that she used to think it was helpful but recently was concerned that it was causing unpleasant side effects so she chose to discontinue it.  She has had other medicines for anxiety in the past as well including Valium and Klonopin.  Patient specifically asks me about Lyrica which she says she used to take for her pain and she thought also helped with her anxiety.  No history of suicide attempts no history of psychiatric hospitalization  Risk to Self: Is patient at risk  for suicide?: No Risk to Others:   Prior Inpatient Therapy:   Prior Outpatient Therapy:    Past Medical History:  Past Medical History:  Diagnosis Date  . (HFpEF) heart failure with preserved ejection fraction ([place])    a. 11/2014 Echo: EF 50-55%, Gr1 DD; b. 06/2016 Echo: EF 60-65%, no rwma, mod  dil LA, nl RV, triv eff.  . Asthma   . CHF (congestive heart failure) ([place])   . CKD (chronic kidney disease), stage III ([place])   . COPD (chronic obstructive pulmonary disease) ([place])    a. Home O2.  . Diabetes mellitus without complication ([place])   . Hypertension   . Iron deficiency anemia    a. chronic blood loss->heavy menses.  . Morbid obesity ([place])   . Pericardial effusion    a. 11/2014 CT Chest: Mod effusion; b. 11/2014 Echo: small to mod circumferential effusion. No hemodynamic compromise.  [name] Poorly controlled diabetes mellitus ([place])    a. 11/2014 A1c 13.2.    Past Surgical History:  Procedure Laterality Date  . CESAREAN SECTION     times 3   Family History:  Family History  Problem Relation Age of Onset  . Diabetes Mother   . Hypertension Mother   . Hypertension Father   . Diabetes Father   . Hypertension Unknown   . Diabetes Unknown   . Breast cancer Maternal Aunt 42  . Breast cancer Maternal Aunt 50   Family Psychiatric  History: None reported Social History:  Social History   Substance and Sexual Activity  Alcohol Use Yes  . Alcohol/week: 0.0 oz   Comment: occ drink - <1/wk     Social History   Substance and Sexual Activity  Drug Use No    Social History   Socioeconomic History  . Marital status: Married    Spouse name: None  . Number of children: None  . Years of education: 88  . Highest education level: High school graduate  Social Needs  . Financial resource strain: Very hard  . Food insecurity - worry: Often true  . Food insecurity - inability: Often true  . Transportation needs - medical: Yes  . Transportation needs - non-medical: Yes  Occupational History  . Occupation: Disabled  Tobacco Use  . Smoking status: Never Smoker  . Smokeless tobacco: Never Used  Substance and Sexual Activity  . Alcohol use: Yes    Alcohol/week: 0.0 oz    Comment: occ drink - <1/wk  . Drug use: No  . Sexual activity: None  Other Topics Concern  . None   Social History Narrative   Lives in [place] with her husband and 46 y/o child.  Three other children are grown and out of the house.  She is sedentary and does not routinely exercise.  On home O2.   Additional Social History:    Allergies:   Allergies  Allergen Reactions  . Dust Mite Extract Shortness Of Breath and Itching  . Mold Extract [Trichophyton] Shortness Of Breath and Itching  . Feraheme [Ferumoxytol] Itching  . Pollen Extract   . Sulfa Antibiotics Itching    Labs:  Results for orders placed or performed during the hospital encounter of 03/24/17 (from the past 48 hour(s))  Glucose, capillary     Status: Abnormal   Collection Time: 03/30/17  7:53 AM  Result Value Ref Range   Glucose-Capillary 130 (H) 65 - 99 mg/dL  Glucose, capillary     Status: Abnormal   Collection Time: 03/30/17 11:48 AM  Result Value Ref Range   Glucose-Capillary 116 (H) 65 - 99 mg/dL  Glucose, capillary     Status: Abnormal   Collection Time: 03/30/17  4:47 PM  Result Value Ref Range   Glucose-Capillary 129 (H) 65 - 99 mg/dL  Glucose, capillary     Status: Abnormal   Collection Time: 03/30/17  8:24 PM  Result Value Ref Range   Glucose-Capillary 144 (H) 65 - 99 mg/dL  Glucose, capillary     Status: Abnormal   Collection Time: 03/31/17  8:19 AM  Result Value Ref Range   Glucose-Capillary 146 (H) 65 - 99 mg/dL  Glucose, capillary     Status: Abnormal   Collection Time: 03/31/17 11:49 AM  Result Value Ref Range   Glucose-Capillary 181 (H) 65 - 99 mg/dL  Glucose, capillary     Status: Abnormal   Collection Time: 03/31/17  5:40 PM  Result Value Ref Range   Glucose-Capillary 163 (H) 65 - 99 mg/dL    Current Facility-Administered Medications  Medication Dose Route Frequency Provider Last Rate Last Dose  . 0.9 %  sodium chloride infusion  250 mL Intravenous PRN Sudini, Alveta Heimlich, MD      . acetaminophen (TYLENOL) tablet 650 mg  650 mg Oral Q6H PRN Hillary Bow, MD   650 mg at 03/30/17 2156   Or   . acetaminophen (TYLENOL) suppository 650 mg  650 mg Rectal Q6H PRN Hillary Bow, MD      . aspirin EC tablet 81 mg  81 mg Oral Daily Hillary Bow, MD   81 mg at 03/31/17 0856  . atorvastatin (LIPITOR) tablet 80 mg  80 mg Oral q1800 Hillary Bow, MD   80 mg at 03/30/17 2156  . citalopram (CELEXA) tablet 40 mg  40 mg Oral Daily Sudini, Srikar, MD      . cloNIDine (CATAPRES) tablet 0.2 mg  0.2 mg Oral BID Hillary Bow, MD   0.2 mg at 03/31/17 0854  . cyclobenzaprine (FLEXERIL) tablet 7.5 mg  7.5 mg Oral TID PRN Lance Coon, MD   7.5 mg at 03/29/17 0353  . diphenhydrAMINE (BENADRYL) capsule 25 mg  25 mg Oral Q8H PRN Hillary Bow, MD   25 mg at 03/31/17 1831  . heparin injection 5,000 Units  5,000 Units Subcutaneous Q8H Sudini, Alveta Heimlich, MD   5,000 Units at 03/31/17 1329  . hydrALAZINE (APRESOLINE) tablet 100 mg  100 mg Oral Q8H Sudini, Srikar, MD   100 mg at 03/31/17 1824  . hydrocerin (EUCERIN) cream   Topical BID Dellinger, Haynes Dage L, PA-C      . insulin aspart (novoLOG) injection 0-15 Units  0-15 Units Subcutaneous TID WC Hillary Bow, MD   3 Units at 03/31/17 1824  . insulin aspart (novoLOG) injection 0-5 Units  0-5 Units Subcutaneous QHS Hillary Bow, MD   2 Units at 03/27/17 2107  . insulin glargine (LANTUS) injection 12 Units  12 Units Subcutaneous QHS Hillary Bow, MD   12 Units at 03/30/17 2157  . ipratropium-albuterol (DUONEB) 0.5-2.5 (3) MG/3ML nebulizer solution 3 mL  3 mL Nebulization Q4H Salary, Montell D, MD   3 mL at 03/31/17 2051  . losartan (COZAAR) tablet 50 mg  50 mg Oral Daily Hillary Bow, MD   50 mg at 03/31/17 0854  . metolazone (ZAROXOLYN) tablet 5 mg  5 mg Oral Daily Hillary Bow, MD   5 mg at 03/31/17 0856  . metoprolol tartrate (LOPRESSOR) tablet 100 mg  100 mg Oral BID Hillary Bow, MD  100 mg at 03/31/17 0855  . ondansetron (ZOFRAN) tablet 4 mg  4 mg Oral Q6H PRN Hillary Bow, MD       Or  . ondansetron (ZOFRAN) injection 4 mg  4 mg  Intravenous Q6H PRN Sudini, Srikar, MD      . polyethylene glycol (MIRALAX / GLYCOLAX) packet 17 g  17 g Oral Daily PRN Sudini, Srikar, MD      . potassium chloride (K-DUR) CR tablet 10 mEq  10 mEq Oral BID Hillary Bow, MD   10 mEq at 03/31/17 0854  . pregabalin (LYRICA) capsule 75 mg  75 mg Oral BID Clapacs, John T, MD      . sodium chloride (OCEAN) 0.65 % nasal spray 1 spray  1 spray Each Nare PRN Demetrios Loll, MD   1 spray at 03/25/17 2206  . sodium chloride flush (NS) 0.9 % injection 3 mL  3 mL Intravenous Q12H Hillary Bow, MD   3 mL at 03/31/17 0858  . sodium chloride flush (NS) 0.9 % injection 3 mL  3 mL Intravenous PRN Sudini, Alveta Heimlich, MD      . torsemide (DEMADEX) tablet 100 mg  100 mg Oral BID Hillary Bow, MD   100 mg at 03/31/17 1824    Musculoskeletal: Strength & Muscle Tone: decreased Gait & Station: unable to stand Patient leans: N/A  Psychiatric Specialty Exam: Physical Exam  Nursing note and vitals reviewed. Constitutional: She appears well-developed and well-nourished.  HENT:  Head: Normocephalic and atraumatic.  Eyes: Conjunctivae are normal. Pupils are equal, round, and reactive to light.  Neck: Normal range of motion.  Cardiovascular: Regular rhythm and normal heart sounds.  Respiratory: Effort normal. No respiratory distress.  GI: Soft.  Musculoskeletal: Normal range of motion.  Neurological: She is alert.  Skin: Skin is warm and dry.  Psychiatric: Her speech is normal and behavior is normal. Judgment and thought content normal. Her mood appears anxious. Cognition and memory are normal.    Review of Systems  Constitutional: Negative.   HENT: Negative.   Eyes: Negative.   Respiratory: Positive for shortness of breath.   Cardiovascular: Negative.   Gastrointestinal: Negative.   Musculoskeletal: Negative.   Skin: Negative.   Neurological: Negative.   Psychiatric/Behavioral: Negative for depression, hallucinations, memory loss, substance abuse and  suicidal ideas. The patient is nervous/anxious and has insomnia.     Blood pressure 135/71, pulse 81, temperature 98.2 F (36.8 C), temperature source Oral, resp. rate 18, height 5\' 4"  (1.626 m), weight (!) 171.5 kg (378 lb), last menstrual period 09/06/2016, SpO2 98 %.Body mass index is 64.88 kg/m.  General Appearance: Casual  Eye Contact:  Good  Speech:  Clear and Coherent  Volume:  Normal  Mood:  Anxious  Affect:  Appropriate  Thought Process:  Goal Directed  Orientation:  Full (Time, Place, and Person)  Thought Content:  Logical  Suicidal Thoughts:  No  Homicidal Thoughts:  No  Memory:  Immediate;   Good Recent;   Fair Remote;   Fair  Judgement:  Fair  Insight:  Fair  Psychomotor Activity:  Decreased  Concentration:  Concentration: Fair  Recall:  [employer] of Knowledge:  Fair  Language:  Fair  Akathisia:  No  Handed:  Right  AIMS (if indicated):     Assets:  Desire for Improvement Housing Resilience Social Support  ADL's:  Impaired  Cognition:  WNL  Sleep:        Treatment Plan Summary: Medication management and Plan 47 year old woman  with multiple medical problems who was taking citalopram for anxiety and irritability symptoms.  Patient is open to restarting some medication.  We discussed the uses of SSRIs and benzodiazepines.  I discouraged her from restarting chronic benzodiazepines.  Patient asked me if we could consider restarting Lyrica.  I combed back through her chart and could not find when she was taking Lyrica in the past.  There does not seem to be any clear contraindication and she says she thought it helped with sleep and with her restless legs and chronic pain.  We will restart 75 mg twice a day at her request.  Patient is not acutely dangerous does not require psychiatric inpatient treatment.  I will follow up as needed.  Disposition: No evidence of imminent risk to self or others at present.   Patient does not meet criteria for psychiatric inpatient  admission.  Alethia Berthold, MD 03/31/2017 9:09 PM

## 2017-04-01 ENCOUNTER — Inpatient Hospital Stay: Payer: Medicaid Other

## 2017-04-01 LAB — BASIC METABOLIC PANEL
Anion gap: 12 (ref 5–15)
BUN: 62 mg/dL — ABNORMAL HIGH (ref 6–20)
CO2: 36 mmol/L — AB (ref 22–32)
Calcium: 8.3 mg/dL — ABNORMAL LOW (ref 8.9–10.3)
Chloride: 89 mmol/L — ABNORMAL LOW (ref 101–111)
Creatinine, Ser: 2.65 mg/dL — ABNORMAL HIGH (ref 0.44–1.00)
GFR calc non Af Amer: 21 mL/min — ABNORMAL LOW (ref >60)
GFR, EST AFRICAN AMERICAN: 24 mL/min — AB (ref >60)
Glucose, Bld: 181 mg/dL — ABNORMAL HIGH (ref 65–99)
POTASSIUM: 3.9 mmol/L (ref 3.5–5.1)
SODIUM: 137 mmol/L (ref 135–145)

## 2017-04-01 LAB — GLUCOSE, CAPILLARY
GLUCOSE-CAPILLARY: 166 mg/dL — AB (ref 65–99)
GLUCOSE-CAPILLARY: 175 mg/dL — AB (ref 65–99)
GLUCOSE-CAPILLARY: 167 mg/dL — AB (ref 65–99)

## 2017-04-01 MED ORDER — METOLAZONE 10 MG PO TABS
10.0000 mg | ORAL_TABLET | Freq: Every day | ORAL | Status: DC
  Administered 2017-04-02 – 2017-04-04 (×3): 10 mg via ORAL
  Filled 2017-04-01 (×4): qty 1

## 2017-04-01 MED ORDER — POTASSIUM CHLORIDE CRYS ER 20 MEQ PO TBCR
20.0000 meq | EXTENDED_RELEASE_TABLET | Freq: Two times a day (BID) | ORAL | Status: DC
  Administered 2017-04-01 – 2017-04-05 (×7): 20 meq via ORAL
  Filled 2017-04-01 (×8): qty 1

## 2017-04-01 MED ORDER — METOLAZONE 5 MG PO TABS
5.0000 mg | ORAL_TABLET | Freq: Once | ORAL | Status: AC
  Administered 2017-04-01: 5 mg via ORAL
  Filled 2017-04-01 (×2): qty 1

## 2017-04-01 MED ORDER — FUROSEMIDE 10 MG/ML IJ SOLN
80.0000 mg | Freq: Three times a day (TID) | INTRAMUSCULAR | Status: DC
  Administered 2017-04-01 – 2017-04-02 (×3): 80 mg via INTRAVENOUS
  Filled 2017-04-01 (×3): qty 8

## 2017-04-01 NOTE — NC FL2 (Signed)
[place] LEVEL OF CARE SCREENING TOOL     IDENTIFICATION  Patient Name: [name] Birthdate: Jan 05, 1971 Sex: female Admission Date (Current Location): 03/24/2017  [place] and [place] Number:  [name] [id number] [place] and Address:  [facility], [address] [place], [place] [address]      Provider Number: [id number]  Attending Physician Name and Address:  Gorden Harms, MD  Relative Name and Phone Number:  Tajai, Suder [phone]     Current Level of Care: Hospital Recommended Level of Care: [place] Prior Approval Number:    Date Approved/Denied:   PASRR Number: [id number]  Discharge Plan: SNF    Current Diagnoses: Patient Active Problem List   Diagnosis Date Noted  . Adjustment disorder with mixed anxiety and depressed mood 03/31/2017  . Dysthymia 03/31/2017  . Chronic pain syndrome 03/31/2017  . Palliative care encounter   . Obesity hypoventilation syndrome ([place]) 03/09/2017  . CHF (congestive heart failure) ([place]) 03/03/2017  . Chronic diastolic heart failure ([place]) 01/07/2017  . Pneumonia 01/07/2017  . Acute exacerbation of CHF (congestive heart failure) ([place]) 11/07/2016  . Depression 09/24/2016  . Acute on chronic diastolic heart failure ([place]) 07/04/2016  . COPD with chronic bronchitis ([place]) 07/04/2016  . CKD (chronic kidney disease), stage III ([place]) 06/26/2016  . COPD exacerbation ([place]) 03/25/2016  . Anxiety 10/02/2015  . Asthma 08/30/2015  . Poorly controlled type 2 diabetes mellitus ([place]) 03/13/2015  . Iron deficiency anemia 02/17/2015  . Morbid obesity ([place])   . Shortness of breath   . Essential hypertension   . Hypertensive heart disease with congestive heart failure ([place]) 12/18/2014  . Diabetes ([place]) 12/15/2014    Orientation RESPIRATION BLADDER Height & Weight     Self, Time, Situation, Place  O2(4L) Continent Weight: (low bed reading (81) for patient  weight.) Height:  5\' 4"  (162.6 cm)  BEHAVIORAL SYMPTOMS/MOOD NEUROLOGICAL BOWEL NUTRITION STATUS      Continent Diet(Carb Modified)  AMBULATORY STATUS COMMUNICATION OF NEEDS Skin   Extensive Assist Verbally Normal                       Personal Care Assistance Level of Assistance  Bathing, Feeding, Dressing Bathing Assistance: Limited assistance Feeding assistance: Independent Dressing Assistance: Limited assistance     Functional Limitations Info  Sight, Hearing, Speech Sight Info: Adequate Hearing Info: Adequate Speech Info: Adequate    SPECIAL CARE FACTORS FREQUENCY  PT (By licensed PT)     PT Frequency: 5x a week OT Frequency: 5x a week            Contractures Contractures Info: Not present    Additional Factors Info  Code Status, Allergies, Psychotropic, Insulin Sliding Scale Code Status Info: Full Code Allergies Info: DUST MITE EXTRACT, MOLD EXTRACT TRICHOPHYTON, FERAHEME FERUMOXYTOL, POLLEN EXTRACT, SULFA ANTIBIOTICS Psychotropic Info: citalopram (CELEXA) tablet 40 mg  Insulin Sliding Scale Info: insulin aspart (novoLOG) injection 0-15 Units 3x a day with meals       Current Medications (04/01/2017):  This is the current hospital active medication list Current Facility-Administered Medications  Medication Dose Route Frequency Provider Last Rate Last Dose  . 0.9 %  sodium chloride infusion  250 mL Intravenous PRN Sudini, Alveta Heimlich, MD      . acetaminophen (TYLENOL) tablet 650 mg  650 mg Oral Q6H PRN Hillary Bow, MD   650 mg at 04/01/17 2119   Or  . acetaminophen (TYLENOL) suppository 650 mg  650 mg Rectal  Q6H PRN Hillary Bow, MD      . aspirin EC tablet 81 mg  81 mg Oral Daily Hillary Bow, MD   81 mg at 04/01/17 0913  . atorvastatin (LIPITOR) tablet 80 mg  80 mg Oral q1800 Hillary Bow, MD   80 mg at 03/31/17 2326  . citalopram (CELEXA) tablet 40 mg  40 mg Oral Daily Sudini, Srikar, MD      . cloNIDine (CATAPRES) tablet 0.2 mg  0.2 mg Oral BID  Hillary Bow, MD   0.2 mg at 04/01/17 0913  . cyclobenzaprine (FLEXERIL) tablet 7.5 mg  7.5 mg Oral TID PRN Lance Coon, MD   7.5 mg at 03/29/17 0353  . diphenhydrAMINE (BENADRYL) capsule 25 mg  25 mg Oral Q8H PRN Hillary Bow, MD   25 mg at 03/31/17 1831  . heparin injection 5,000 Units  5,000 Units Subcutaneous Q8H Hillary Bow, MD   5,000 Units at 04/01/17 [phone]  . hydrALAZINE (APRESOLINE) tablet 100 mg  100 mg Oral Q8H Sudini, Srikar, MD   100 mg at 04/01/17 0912  . hydrocerin (EUCERIN) cream   Topical BID Dellinger, Haynes Dage L, PA-C      . insulin aspart (novoLOG) injection 0-15 Units  0-15 Units Subcutaneous TID WC Hillary Bow, MD   3 Units at 04/01/17 [phone]  . insulin aspart (novoLOG) injection 0-5 Units  0-5 Units Subcutaneous QHS Hillary Bow, MD   2 Units at 03/27/17 2107  . insulin glargine (LANTUS) injection 12 Units  12 Units Subcutaneous QHS Hillary Bow, MD   12 Units at 03/31/17 2326  . ipratropium-albuterol (DUONEB) 0.5-2.5 (3) MG/3ML nebulizer solution 3 mL  3 mL Nebulization Q4H Salary, Montell D, MD   3 mL at 04/01/17 0837  . losartan (COZAAR) tablet 50 mg  50 mg Oral Daily Hillary Bow, MD   50 mg at 04/01/17 0913  . metolazone (ZAROXOLYN) tablet 5 mg  5 mg Oral Daily Hillary Bow, MD   5 mg at 04/01/17 0913  . metoprolol tartrate (LOPRESSOR) tablet 100 mg  100 mg Oral BID Hillary Bow, MD   100 mg at 04/01/17 0906  . ondansetron (ZOFRAN) tablet 4 mg  4 mg Oral Q6H PRN Hillary Bow, MD       Or  . ondansetron (ZOFRAN) injection 4 mg  4 mg Intravenous Q6H PRN Sudini, Srikar, MD      . polyethylene glycol (MIRALAX / GLYCOLAX) packet 17 g  17 g Oral Daily PRN Sudini, Srikar, MD      . potassium chloride (K-DUR,KLOR-CON) CR tablet 10 mEq  10 mEq Oral BID Hillary Bow, MD   10 mEq at 04/01/17 0912  . pregabalin (LYRICA) capsule 75 mg  75 mg Oral BID Clapacs, Madie Reno, MD   75 mg at 04/01/17 0913  . sodium chloride (OCEAN) 0.65 % nasal spray 1 spray  1 spray  Each Nare PRN Demetrios Loll, MD   1 spray at 03/25/17 2206  . sodium chloride flush (NS) 0.9 % injection 3 mL  3 mL Intravenous Q12H Hillary Bow, MD   3 mL at 04/01/17 0914  . sodium chloride flush (NS) 0.9 % injection 3 mL  3 mL Intravenous PRN Sudini, Alveta Heimlich, MD      . torsemide (DEMADEX) tablet 100 mg  100 mg Oral BID Hillary Bow, MD   100 mg at 04/01/17 6314     Discharge Medications: Please see discharge summary for a list of discharge medications.  Relevant Imaging Results:  Relevant Lab  Results:   Additional Information SSN [id number]  Ross Ludwig, [place]

## 2017-04-01 NOTE — Plan of Care (Signed)
  Progressing Education: Knowledge of General Education information will improve 04/01/2017 2349 - Progressing by Liliane Channel, RN 04/01/2017 2336 - Progressing by Liliane Channel, RN Clinical Measurements: Ability to maintain clinical measurements within normal limits will improve 04/01/2017 2336 - Progressing by Liliane Channel, RN Will remain free from infection 04/01/2017 2336 - Progressing by Liliane Channel, RN Respiratory complications will improve 04/01/2017 2349 - Progressing by Liliane Channel, RN 04/01/2017 2336 - Progressing by Liliane Channel, RN Pain Managment: General experience of comfort will improve 04/01/2017 2349 - Progressing by Liliane Channel, RN 04/01/2017 2336 - Progressing by Liliane Channel, RN Safety: Ability to remain free from injury will improve 04/01/2017 2349 - Progressing by Liliane Channel, RN Skin Integrity: Risk for impaired skin integrity will decrease 04/01/2017 2349 - Progressing by Liliane Channel, RN 04/01/2017 2336 - Progressing by Liliane Channel, RN

## 2017-04-01 NOTE — Plan of Care (Signed)
  Progressing Education: Knowledge of General Education information will improve 04/01/2017 2336 - Progressing by Liliane Channel, RN Clinical Measurements: Ability to maintain clinical measurements within normal limits will improve 04/01/2017 2336 - Progressing by Liliane Channel, RN Will remain free from infection 04/01/2017 2336 - Progressing by Liliane Channel, RN Respiratory complications will improve 04/01/2017 2336 - Progressing by Liliane Channel, RN Pain Managment: General experience of comfort will improve 04/01/2017 2336 - Progressing by Liliane Channel, RN Skin Integrity: Risk for impaired skin integrity will decrease 04/01/2017 2336 - Progressing by Liliane Channel, RN

## 2017-04-01 NOTE — Progress Notes (Signed)
Transferred patient to regular bed with the assistance of aid and nursing students. Patient was able to pivot to the bed with maximum assist. Patient states she is resting more comfortably in the bed. Will continue to monitor patient.

## 2017-04-01 NOTE — Clinical Social Work Note (Signed)
CSW was informed that patient has been approved for Medicaid, CSW resent information to SNFs looking for bed placement.  Jones Broom. [place], MSW, [place]  04/01/2017 12:29 PM

## 2017-04-01 NOTE — Care Management Note (Signed)
Case Management Note  Patient Details  Name: AYODELE HARTSOCK MRN: [id number] Date of Birth: [birth year]  Subjective/Objective:                 Found that patient has been approved for medicaid as of 2.1.2019. It is related to her disability   Action/Plan:   Expected Discharge Date:                  Expected Discharge Plan:     In-House Referral:     Discharge planning Services     Post Acute Care Choice:    Choice offered to:     DME Arranged:    DME Agency:     HH Arranged:    HH Agency:     Status of Service:     If discussed at [employer], dates discussed:    Additional Comments:  Katrina Stack, RN 04/01/2017, 2:14 PM

## 2017-04-01 NOTE — Progress Notes (Signed)
[facility] NAME: [name]    MR#:  [id number]  DATE OF BIRTH:  06/28/1970  SUBJECTIVE:  CHIEF COMPLAINT:   Chief Complaint  Patient presents with  . Shortness of Breath  . Congestive Heart Failure  Patient complaining of abdominal as well as bilateral thigh swelling/discomfort  REVIEW OF SYSTEMS:  CONSTITUTIONAL: No fever, fatigue or weakness.  EYES: No blurred or double vision.  EARS, NOSE, AND THROAT: No tinnitus or ear pain.  RESPIRATORY: No cough, shortness of breath, wheezing or hemoptysis.  CARDIOVASCULAR: No chest pain, orthopnea, edema.  GASTROINTESTINAL: No nausea, vomiting, diarrhea or abdominal pain.  GENITOURINARY: No dysuria, hematuria.  ENDOCRINE: No polyuria, nocturia,  HEMATOLOGY: No anemia, easy bruising or bleeding SKIN: No rash or lesion. MUSCULOSKELETAL: No joint pain or arthritis.   NEUROLOGIC: No tingling, numbness, weakness.  PSYCHIATRY: No anxiety or depression.   ROS  DRUG ALLERGIES:   Allergies  Allergen Reactions  . Dust Mite Extract Shortness Of Breath and Itching  . Mold Extract [Trichophyton] Shortness Of Breath and Itching  . Feraheme [Ferumoxytol] Itching  . Pollen Extract   . Sulfa Antibiotics Itching    VITALS:  Blood pressure 120/71, pulse 77, temperature 99.2 F (37.3 C), temperature source Oral, resp. rate (!) 24, height 5\' 4"  (1.626 m), weight (!) 171.5 kg (378 lb), last menstrual period 09/06/2016, SpO2 99 %.  PHYSICAL EXAMINATION:  GENERAL:  47 y.o.-year-old patient lying in the bed with no acute distress.  EYES: Pupils equal, round, reactive to light and accommodation. No scleral icterus. Extraocular muscles intact.  HEENT: Head atraumatic, normocephalic. Oropharynx and nasopharynx clear.  NECK:  Supple, no jugular venous distention. No thyroid enlargement, no tenderness.  LUNGS: Normal breath sounds bilaterally, no wheezing, rales,rhonchi or crepitation. No use of  accessory muscles of respiration.  CARDIOVASCULAR: S1, S2 normal. No murmurs, rubs, or gallops.  ABDOMEN: Soft, nontender, nondistended. Bowel sounds present. No organomegaly or mass.  EXTREMITIES: No pedal edema, cyanosis, or clubbing.  NEUROLOGIC: Cranial nerves II through XII are intact. MAES. Gait not checked.  PSYCHIATRIC: The patient is alert and oriented x 3.  SKIN: No obvious rash, lesion, or ulcer.  Skin changes in abdomen/pannus as well as upper thighs-orange peel appearance  Physical Exam LABORATORY PANEL:   CBC Recent Labs  Lab 03/28/17 0728  WBC 7.5  HGB 8.3*  HCT 27.3*  PLT 269   ------------------------------------------------------------------------------------------------------------------  Chemistries  Recent Labs  Lab 03/28/17 0728  04/01/17 0553  NA 138   < > 137  K 4.1   < > 3.9  CL 93*   < > 89*  CO2 35*   < > 36*  GLUCOSE 172*   < > 181*  BUN 63*   < > 62*  CREATININE 2.82*   < > 2.65*  CALCIUM 8.6*   < > 8.3*  MG 1.7  --   --    < > = values in this interval not displayed.   ------------------------------------------------------------------------------------------------------------------  Cardiac Enzymes No results for input(s): TROPONINI in the last 168 hours. ------------------------------------------------------------------------------------------------------------------  RADIOLOGY:  No results found.  ASSESSMENT AND PLAN:  *Acute on chronic diastolic congestive heart failure Complains of worsening abdominal/upper thigh swelling/discomfort Start Lasix 80 mg IV 3 times daily, increase Zaroxolyn to 10 mg daily, hold torsemide, strict I&O monitoring, check lower extremity Dopplers to evaluate for possible DVT, continue low-sodium diet with fluid restriction  *Acute on chronic respiratory failure Resolved Due to CHF  Stable on chronic 4 L of oxygen via nasal cannula  *Hypertension Stable on current regiment  *Insulin-dependent  diabetes mellitus. Stable on current regiment  *Elevated troponin likely due to CHF. No further ischemia workup at this time  *CKD stage IV Stable  Poor prognosis with morbid obesity, chf, non compliance. Not a candidate for bariatric surgery with her cardiac status Disposition toSNFand possible due to lack of insurance and discussion with case management, patient will have to go home when ready   All the records are reviewed and case discussed with Care Management/Social Workerr. Management plans discussed with the patient, family and they are in agreement.  CODE STATUS: full  TOTAL TIME TAKING CARE OF THIS PATIENT: 40 minutes.     POSSIBLE D/C IN 1-3 DAYS, DEPENDING ON CLINICAL CONDITION.   [name] M.D on 04/01/2017   Between 7am to 6pm - Pager - [phone]  After 6pm go to [email or web address] - password [email or web address] [facility]  Office  [phone]  CC: Primary care physician; [name], NP  Note: This dictation was prepared with Dragon dictation along with smaller phrase technology. Any transcriptional errors that result from this process are unintentional.

## 2017-04-01 NOTE — Care Management (Signed)
[facility] is re checking to see if can provide a hospital bed under charity.  At present unable to provide any services in the hoe at discharge.  Will make an APS  referral at discharge for in home assessment.  have not had a response from the finmancial counselors in regard to [facility] application status.  Psych did consult

## 2017-04-01 NOTE — Plan of Care (Signed)
  Not Progressing Activity: Capacity to carry out activities will improve 04/01/2017 0617 - Not Progressing by Jeri Cos, RN

## 2017-04-01 NOTE — Progress Notes (Signed)
CCMD called to report patient had 4 run of vtach. Patient on bedpan with no complaints, asymptomatic. MD notified, will continue to monitor patient.

## 2017-04-01 NOTE — Progress Notes (Signed)
Physical Therapy Treatment Patient Details Name: [name] MRN: [id number] DOB: [birth year] Today's Date: 04/01/2017    History of Present Illness Pt is a63 y.o.femalewith a known history of systolic CHF, hypertension, diabetes, morbid obesity who was recently discharged from the hospital returns from CHF clinic due to weight gain, worsening shortness of breath. Attempts were made to start IV Lasix through home health but were unsuccessful. Also due to her severe shortness of breath was sent to the emergency room. Here chest x-ray shows pulmonary edema and right effusion which is chronic. Patient has been compliant with medications, fluid restriction and salt restriction.  Assessment includes: Acute on chronic diastolic congestive heart failure, acute on chronic respiratory failure with tachypnea due to CHF, HTN, DM, elevated troponin likely due to CHF, and CKD stage IV.    PT Comments    Pt lethargic, but agreeable to PT for supine bed exercises only. Pt reports recently transferring from one bed to another, which fatigued her; "I just can't do anymore". Pt reports 8/10 pain in B knees. Pt participates in exercises with assist as needed. Encouraged to perform throughout the day for improved movement, decreased pain and increased endurance. Continue to progress to improve functional mobility.    Follow Up Recommendations  SNF     Equipment Recommendations       Recommendations for Other Services       Precautions / Restrictions Precautions Precautions: Fall Restrictions Weight Bearing Restrictions: No    Mobility  Bed Mobility               General bed mobility comments: Not tested; only agreeable to bed exercises. Notes recently stood with assist to transfer from 1 bed to another   Transfers                    Ambulation/Gait                 Stairs            Wheelchair Mobility    Modified Rankin (Stroke Patients Only)        Balance                                            Cognition Arousal/Alertness: Lethargic Behavior During Therapy: WFL for tasks assessed/performed Overall Cognitive Status: Within Functional Limits for tasks assessed                                        Exercises General Exercises - Lower Extremity Ankle Circles/Pumps: AROM;Both;20 reps;Supine Quad Sets: Strengthening;Both;20 reps;Supine Gluteal Sets: Strengthening;Both;20 reps;Supine Short Arc Quad: AROM;Both;20 reps;Supine Heel Slides: AROM;Both;20 reps;Supine(limited range) Hip ABduction/ADduction: AAROM;20 reps;Supine;Right(arom L 2 x 10)    General Comments        Pertinent Vitals/Pain Pain Assessment: 0-10 Pain Score: 8  Pain Location: B knees Pain Intervention(s): Monitored during session    Home Living                      Prior Function            PT Goals (current goals can now be found in the care plan section) Progress towards PT goals: Not progressing toward goals - comment    Frequency  Min 2X/week      PT Plan Current plan remains appropriate    Co-evaluation              AM-PAC PT "6 Clicks" Daily Activity  Outcome Measure  Difficulty turning over in bed (including adjusting bedclothes, sheets and blankets)?: Unable Difficulty moving from lying on back to sitting on the side of the bed? : Unable Difficulty sitting down on and standing up from a chair with arms (e.g., wheelchair, bedside commode, etc,.)?: Unable Help needed moving to and from a bed to chair (including a wheelchair)?: A Lot Help needed walking in hospital room?: Total Help needed climbing 3-5 steps with a railing? : Total 6 Click Score: 7    End of Session Equipment Utilized During Treatment: Oxygen Activity Tolerance: Patient limited by fatigue;Patient limited by lethargy;Other (comment)(weakness) Patient left: in bed;with call bell/phone within reach;with bed alarm  set   PT Visit Diagnosis: Difficulty in walking, not elsewhere classified (R26.2);Muscle weakness (generalized) (M62.81)     Time: 1610-9604 PT Time Calculation (min) (ACUTE ONLY): 19 min  Charges:  $Therapeutic Exercise: 8-22 mins                    G Codes:        Larae Grooms, PTA 04/01/2017, 5:25 PM

## 2017-04-01 NOTE — Consult Note (Signed)
[facility] Consult   Reason for Consult: Follow-up consult 47 year old woman with heart failure.  Concerned about noncompliance with medicine and anxiety. Referring Physician: Salary Patient Identification: [name] MRN:  [id number] Principal Diagnosis: Hypertensive heart disease with congestive heart failure [facility]) Diagnosis:   Patient Active Problem List   Diagnosis Date Noted  . Adjustment disorder with mixed anxiety and depressed mood [F43.23] 03/31/2017  . Dysthymia [F34.1] 03/31/2017  . Chronic pain syndrome [G89.4] 03/31/2017  . Palliative care encounter [Z51.5]   . Obesity hypoventilation syndrome ([place]) [E66.2] 03/09/2017  . CHF (congestive heart failure) ([place]) [I50.9] 03/03/2017  . Chronic diastolic heart failure ([place]) [I50.32] 01/07/2017  . Pneumonia [J18.9] 01/07/2017  . Acute exacerbation of CHF (congestive heart failure) ([place]) [I50.9] 11/07/2016  . Depression [F32.9] 09/24/2016  . Acute on chronic diastolic heart failure ([place]) [I50.33] 07/04/2016  . COPD with chronic bronchitis ([place]) [J44.9] 07/04/2016  . CKD (chronic kidney disease), stage III ([place]) [N18.3] 06/26/2016  . COPD exacerbation ([place]) [J44.1] 03/25/2016  . Anxiety [F41.9] 10/02/2015  . Asthma [J45.909] 08/30/2015  . Poorly controlled type 2 diabetes mellitus ([place]) [E11.65] 03/13/2015  . Iron deficiency anemia [D50.9] 02/17/2015  . Morbid obesity ([place]) [E66.01]   . Shortness of breath [R06.02]   . Essential hypertension [I10]   . Hypertensive heart disease with congestive heart failure ([place]) [I11.0] 12/18/2014  . Diabetes ([place]) [E11.9] 12/15/2014    Total Time spent with patient: 20 minutes  Subjective:   [name] is a 47 y.o. female patient admitted with "I feel about the same".  HPI: Follow-up.  See note from yesterday.  Patient with mild to moderate chronic anxiety problems who had discontinued her Celexa out of a feeling that it was no longer helpful.  I agreed with her  request to restart a modest dose of Lyrica for help with chronic pain restless legs and anxiety.  Patient did get the medicine.  Does not notice any particular difference.  Anxiety levels are low.  No psychiatric complaints.  No sign of dangerousness.  Past Psychiatric History: Long-standing mild anxiety no hot previous notes  Risk to Self: Is patient at risk for suicide?: No Risk to Others:   Prior Inpatient Therapy:   Prior Outpatient Therapy:    Past Medical History:  Past Medical History:  Diagnosis Date  . (HFpEF) heart failure with preserved ejection fraction ([place])    a. 11/2014 Echo: EF 50-55%, Gr1 DD; b. 06/2016 Echo: EF 60-65%, no rwma, mod dil LA, nl RV, triv eff.  . Asthma   . CHF (congestive heart failure) ([place])   . CKD (chronic kidney disease), stage III ([place])   . COPD (chronic obstructive pulmonary disease) ([place])    a. Home O2.  . Diabetes mellitus without complication ([place])   . Hypertension   . Iron deficiency anemia    a. chronic blood loss->heavy menses.  . Morbid obesity ([place])   . Pericardial effusion    a. 11/2014 CT Chest: Mod effusion; b. 11/2014 Echo: small to mod circumferential effusion. No hemodynamic compromise.  [name] Poorly controlled diabetes mellitus ([place])    a. 11/2014 A1c 13.2.    Past Surgical History:  Procedure Laterality Date  . CESAREAN SECTION     times 3   Family History:  Family History  Problem Relation Age of Onset  . Diabetes Mother   . Hypertension Mother   . Hypertension Father   . Diabetes Father   . Hypertension Unknown   . Diabetes  Unknown   . Breast cancer Maternal Aunt 82  . Breast cancer Maternal Aunt 8   Family Psychiatric  History: Unknown Social History:  Social History   Substance and Sexual Activity  Alcohol Use Yes  . Alcohol/week: 0.0 oz   Comment: occ drink - <1/wk     Social History   Substance and Sexual Activity  Drug Use No    Social History   Socioeconomic History  . Marital status: Married     Spouse name: None  . Number of children: None  . Years of education: 46  . Highest education level: High school graduate  Social Needs  . Financial resource strain: Very hard  . Food insecurity - worry: Often true  . Food insecurity - inability: Often true  . Transportation needs - medical: Yes  . Transportation needs - non-medical: Yes  Occupational History  . Occupation: Disabled  Tobacco Use  . Smoking status: Never Smoker  . Smokeless tobacco: Never Used  Substance and Sexual Activity  . Alcohol use: Yes    Alcohol/week: 0.0 oz    Comment: occ drink - <1/wk  . Drug use: No  . Sexual activity: None  Other Topics Concern  . None  Social History Narrative   Lives in [place] with her husband and 42 y/o child.  Three other children are grown and out of the house.  She is sedentary and does not routinely exercise.  On home O2.   Additional Social History:    Allergies:   Allergies  Allergen Reactions  . Dust Mite Extract Shortness Of Breath and Itching  . Mold Extract [Trichophyton] Shortness Of Breath and Itching  . Feraheme [Ferumoxytol] Itching  . Pollen Extract   . Sulfa Antibiotics Itching    Labs:  Results for orders placed or performed during the hospital encounter of 03/24/17 (from the past 48 hour(s))  Glucose, capillary     Status: Abnormal   Collection Time: 03/30/17  8:24 PM  Result Value Ref Range   Glucose-Capillary 144 (H) 65 - 99 mg/dL  Glucose, capillary     Status: Abnormal   Collection Time: 03/31/17  8:19 AM  Result Value Ref Range   Glucose-Capillary 146 (H) 65 - 99 mg/dL  Glucose, capillary     Status: Abnormal   Collection Time: 03/31/17 11:49 AM  Result Value Ref Range   Glucose-Capillary 181 (H) 65 - 99 mg/dL  Glucose, capillary     Status: Abnormal   Collection Time: 03/31/17  5:40 PM  Result Value Ref Range   Glucose-Capillary 163 (H) 65 - 99 mg/dL  Glucose, capillary     Status: Abnormal   Collection Time: 03/31/17 11:09 PM  Result  Value Ref Range   Glucose-Capillary 144 (H) 65 - 99 mg/dL  Basic metabolic panel     Status: Abnormal   Collection Time: 04/01/17  5:53 AM  Result Value Ref Range   Sodium 137 135 - 145 mmol/L   Potassium 3.9 3.5 - 5.1 mmol/L   Chloride 89 (L) 101 - 111 mmol/L   CO2 36 (H) 22 - 32 mmol/L   Glucose, Bld 181 (H) 65 - 99 mg/dL   BUN 62 (H) 6 - 20 mg/dL   Creatinine, Ser 2.65 (H) 0.44 - 1.00 mg/dL   Calcium 8.3 (L) 8.9 - 10.3 mg/dL   GFR calc non Af Amer 21 (L) >60 mL/min   GFR calc Af Amer 24 (L) >60 mL/min    Comment: (NOTE) The eGFR has  been calculated using the CKD EPI equation. This calculation has not been validated in all clinical situations. eGFR's persistently <60 mL/min signify possible Chronic Kidney Disease.    Anion gap 12 5 - 15    Comment: Performed at [facility], [address] [place], [place] [address]  Glucose, capillary     Status: Abnormal   Collection Time: 04/01/17  8:12 AM  Result Value Ref Range   Glucose-Capillary 166 (H) 65 - 99 mg/dL  Glucose, capillary     Status: Abnormal   Collection Time: 04/01/17 12:46 PM  Result Value Ref Range   Glucose-Capillary 175 (H) 65 - 99 mg/dL    Current Facility-Administered Medications  Medication Dose Route Frequency Provider Last Rate Last Dose  . 0.9 %  sodium chloride infusion  250 mL Intravenous PRN Sudini, Alveta Heimlich, MD      . acetaminophen (TYLENOL) tablet 650 mg  650 mg Oral Q6H PRN Hillary Bow, MD   650 mg at 04/01/17 1517   Or  . acetaminophen (TYLENOL) suppository 650 mg  650 mg Rectal Q6H PRN Hillary Bow, MD      . aspirin EC tablet 81 mg  81 mg Oral Daily Hillary Bow, MD   81 mg at 04/01/17 0913  . atorvastatin (LIPITOR) tablet 80 mg  80 mg Oral q1800 Hillary Bow, MD   80 mg at 03/31/17 2326  . citalopram (CELEXA) tablet 40 mg  40 mg Oral Daily Sudini, Srikar, MD      . cloNIDine (CATAPRES) tablet 0.2 mg  0.2 mg Oral BID Hillary Bow, MD   0.2 mg at 04/01/17 0913  .  cyclobenzaprine (FLEXERIL) tablet 7.5 mg  7.5 mg Oral TID PRN Lance Coon, MD   7.5 mg at 03/29/17 0353  . diphenhydrAMINE (BENADRYL) capsule 25 mg  25 mg Oral Q8H PRN Hillary Bow, MD   25 mg at 03/31/17 1831  . furosemide (LASIX) injection 80 mg  80 mg Intravenous TID Loney Hering D, MD   80 mg at 04/01/17 1516  . heparin injection 5,000 Units  5,000 Units Subcutaneous Q8H Sudini, Alveta Heimlich, MD   5,000 Units at 04/01/17 1311  . hydrALAZINE (APRESOLINE) tablet 100 mg  100 mg Oral Q8H Sudini, Srikar, MD   100 mg at 04/01/17 0912  . hydrocerin (EUCERIN) cream   Topical BID Dellinger, Haynes Dage L, PA-C      . insulin aspart (novoLOG) injection 0-15 Units  0-15 Units Subcutaneous TID WC Hillary Bow, MD   3 Units at 04/01/17 1755  . insulin aspart (novoLOG) injection 0-5 Units  0-5 Units Subcutaneous QHS Hillary Bow, MD   2 Units at 03/27/17 2107  . insulin glargine (LANTUS) injection 12 Units  12 Units Subcutaneous QHS Hillary Bow, MD   12 Units at 03/31/17 2326  . ipratropium-albuterol (DUONEB) 0.5-2.5 (3) MG/3ML nebulizer solution 3 mL  3 mL Nebulization Q4H Salary, Montell D, MD   3 mL at 04/01/17 1648  . losartan (COZAAR) tablet 50 mg  50 mg Oral Daily Hillary Bow, MD   50 mg at 04/01/17 0913  . [START ON 04/02/2017] metolazone (ZAROXOLYN) tablet 10 mg  10 mg Oral Daily Salary, Montell D, MD      . metoprolol tartrate (LOPRESSOR) tablet 100 mg  100 mg Oral BID Hillary Bow, MD   100 mg at 04/01/17 0906  . ondansetron (ZOFRAN) tablet 4 mg  4 mg Oral Q6H PRN Hillary Bow, MD       Or  . ondansetron (  ZOFRAN) injection 4 mg  4 mg Intravenous Q6H PRN Sudini, Srikar, MD      . polyethylene glycol (MIRALAX / GLYCOLAX) packet 17 g  17 g Oral Daily PRN Sudini, Srikar, MD      . potassium chloride SA (K-DUR,KLOR-CON) CR tablet 20 mEq  20 mEq Oral BID Salary, Montell D, MD      . pregabalin (LYRICA) capsule 75 mg  75 mg Oral BID Clapacs, Madie Reno, MD   75 mg at 04/01/17 0913  . sodium  chloride (OCEAN) 0.65 % nasal spray 1 spray  1 spray Each Nare PRN Demetrios Loll, MD   1 spray at 03/25/17 2206  . sodium chloride flush (NS) 0.9 % injection 3 mL  3 mL Intravenous Q12H Hillary Bow, MD   3 mL at 04/01/17 0914  . sodium chloride flush (NS) 0.9 % injection 3 mL  3 mL Intravenous PRN Hillary Bow, MD        Musculoskeletal: Strength & Muscle Tone: decreased Gait & Station: unable to stand Patient leans: N/A  Psychiatric Specialty Exam: Physical Exam  Nursing note and vitals reviewed. Constitutional: She appears well-developed and well-nourished.  HENT:  Head: Normocephalic and atraumatic.  Eyes: Conjunctivae are normal. Pupils are equal, round, and reactive to light.  Neck: Normal range of motion.  Cardiovascular: Regular rhythm and normal heart sounds.  Respiratory: She is in respiratory distress.  GI: Soft.  Musculoskeletal: Normal range of motion.  Neurological: She is alert.  Skin: Skin is warm and dry.  Psychiatric: She has a normal mood and affect. Her behavior is normal. Judgment and thought content normal.    Review of Systems  Constitutional: Negative.   HENT: Negative.   Eyes: Negative.   Respiratory: Positive for shortness of breath.   Cardiovascular: Positive for leg swelling.  Gastrointestinal: Negative.   Musculoskeletal: Negative.   Skin: Negative.   Neurological: Negative.   Psychiatric/Behavioral: Negative.     Blood pressure 117/73, pulse 96, temperature 98.6 F (37 C), temperature source Axillary, resp. rate (!) 24, height 5' 4" (1.626 m), weight (!) 172.6 kg (380 lb 9.6 oz), last menstrual period 09/06/2016, SpO2 99 %.Body mass index is 65.33 kg/m.  General Appearance: Casual  Eye Contact:  Fair  Speech:  Clear and Coherent  Volume:  Normal  Mood:  Euthymic  Affect:  Congruent  Thought Process:  Goal Directed  Orientation:  Full (Time, Place, and Person)  Thought Content:  Logical  Suicidal Thoughts:  No  Homicidal Thoughts:  No   Memory:  Immediate;   Fair Recent;   Fair Remote;   Fair  Judgement:  Fair  Insight:  Fair  Psychomotor Activity:  Decreased  Concentration:  Concentration: Fair  Recall:  [employer] of Knowledge:  Fair  Language:  Fair  Akathisia:  No  Handed:  Right  AIMS (if indicated):     Assets:  Desire for Improvement Housing Resilience Social Support  ADL's:  Intact  Cognition:  WNL  Sleep:        Treatment Plan Summary: Medication management and Plan 47 year old woman.  Mild anxiety.  No new complaints.  Definitely no sign of dangerousness.  Tolerated the Lyrica and would like to continue with it.  No change to dosage for today.  If she tolerates it well with no oversedation we could increase the dose in a couple days.  No other change ordered right now.  Supportive counseling.  I will follow as needed.  Disposition: No evidence  of imminent risk to self or others at present.   Patient does not meet criteria for psychiatric inpatient admission. Supportive therapy provided about ongoing stressors.  Alethia Berthold, MD 04/01/2017 7:14 PM

## 2017-04-02 DIAGNOSIS — Z7189 Other specified counseling: Secondary | ICD-10-CM

## 2017-04-02 DIAGNOSIS — N184 Chronic kidney disease, stage 4 (severe): Secondary | ICD-10-CM

## 2017-04-02 DIAGNOSIS — I509 Heart failure, unspecified: Secondary | ICD-10-CM

## 2017-04-02 LAB — GLUCOSE, CAPILLARY
GLUCOSE-CAPILLARY: 158 mg/dL — AB (ref 65–99)
GLUCOSE-CAPILLARY: 247 mg/dL — AB (ref 65–99)
Glucose-Capillary: 206 mg/dL — ABNORMAL HIGH (ref 65–99)
Glucose-Capillary: 209 mg/dL — ABNORMAL HIGH (ref 65–99)
Glucose-Capillary: 247 mg/dL — ABNORMAL HIGH (ref 65–99)

## 2017-04-02 LAB — URINALYSIS, COMPLETE (UACMP) WITH MICROSCOPIC
BILIRUBIN URINE: NEGATIVE
GLUCOSE, UA: NEGATIVE mg/dL
HGB URINE DIPSTICK: NEGATIVE
KETONES UR: NEGATIVE mg/dL
LEUKOCYTES UA: NEGATIVE
Nitrite: NEGATIVE
PH: 5 (ref 5.0–8.0)
Protein, ur: 100 mg/dL — AB
SPECIFIC GRAVITY, URINE: 1.008 (ref 1.005–1.030)

## 2017-04-02 LAB — BASIC METABOLIC PANEL
ANION GAP: 15 (ref 5–15)
BUN: 67 mg/dL — ABNORMAL HIGH (ref 6–20)
CALCIUM: 8.2 mg/dL — AB (ref 8.9–10.3)
CHLORIDE: 88 mmol/L — AB (ref 101–111)
CO2: 34 mmol/L — AB (ref 22–32)
Creatinine, Ser: 2.78 mg/dL — ABNORMAL HIGH (ref 0.44–1.00)
GFR calc non Af Amer: 19 mL/min — ABNORMAL LOW (ref >60)
GFR, EST AFRICAN AMERICAN: 23 mL/min — AB (ref >60)
GLUCOSE: 219 mg/dL — AB (ref 65–99)
POTASSIUM: 4.3 mmol/L (ref 3.5–5.1)
Sodium: 137 mmol/L (ref 135–145)

## 2017-04-02 MED ORDER — FUROSEMIDE 10 MG/ML IJ SOLN
8.0000 mg/h | INTRAVENOUS | Status: DC
  Administered 2017-04-02 – 2017-04-04 (×2): 8 mg/h via INTRAVENOUS
  Filled 2017-04-02 (×2): qty 25

## 2017-04-02 MED ORDER — FLEET ENEMA 7-19 GM/118ML RE ENEM
1.0000 | ENEMA | Freq: Once | RECTAL | Status: AC
  Administered 2017-04-02: 1 via RECTAL

## 2017-04-02 MED ORDER — ALBUMIN HUMAN 25 % IV SOLN
12.5000 g | Freq: Four times a day (QID) | INTRAVENOUS | Status: AC
  Administered 2017-04-02 – 2017-04-04 (×7): 12.5 g via INTRAVENOUS
  Filled 2017-04-02 (×8): qty 50

## 2017-04-02 NOTE — Progress Notes (Signed)
In and out cath and fleet enema has been done. Patient on bed pan, waiting to see if she can have a bowel movement. Patient tolerated both very well with no complaints. Will continue to monitor patient.

## 2017-04-02 NOTE — Progress Notes (Signed)
New referral for out patient PALLIATIVE to follow at home received from [facility]. Plan is for discharge tomorrow. Patient information faxed to referral. Flo Shanks RN, BSN, [facility] and [facility], hospital Liaison [phone]

## 2017-04-02 NOTE — Progress Notes (Signed)
[facility], [place] 04/02/17  Subjective:   Patient reports she was admitted last Monday 06/04/79 with A/C diastolic CHF and generalized edema per the suggestion of [name], FNP (HF clinic) after presenting to her clinic appointment with substantially painful b/l edema of legs following a fall ~ two months prior.   For the last two months, the patient states she has felt more fatigued and has had difficulty breathing. She is unable to leave her house d/t pain and swelling in her legs, as well as pain in her lower back and abdomen, which she says is swollen and hard. Patient reports she is compliant with all home medications, except Celexa. She states the Celexa gives her weird dreams and "makes [her] feel weird." She denies any changes in her diet and reports reduced appetite and that her food all tastes "off." She states she does not smoke and has a beer or glass of wine q6 months.   Since her admission, she c/o generalized pruritis and a b/l upper extremity resting tremor.   Currently, she c/o urinary urgency. She is unable to urinate and is pending a bladder scan. She denies current chest pains, nausea, or difficulty breathing on [place] O2 at 4L/minute.   Of note, patient sees Dr. Rockey [facility]) for chronic diastolic heart failure and Dr Juleen China ([place] ) for CKD3. She also reports a pulmonologist and psychiatrist.  Patient has worsening volume overload despite iv lasix. Nephrology consult has been requested for evaluation   Objective:  Vital signs in last 24 hours:  Temp:  [98.4 F (36.9 C)-98.8 F (37.1 C)] 98.4 F (36.9 C) (02/06 0902) Pulse Rate:  [78-96] 88 (02/06 0902) Resp:  [18-24] 18 (02/06 0902) BP: (117-152)/(64-89) 152/89 (02/06 0902) SpO2:  [91 %-100 %] 100 % (02/06 0902) FiO2 (%):  [36 %] 36 % (02/06 0416) Weight:  [171.6 kg (378 lb 6.4 oz)-172.6 kg (380 lb 9.6 oz)] 171.6 kg (378 lb 6.4 oz) (02/06 0416)  Weight change:  Filed Weights    03/31/17 0459 04/01/17 1700 04/02/17 0416  Weight: (!) 171.5 kg (378 lb) (!) 172.6 kg (380 lb 9.6 oz) (!) 171.6 kg (378 lb 6.4 oz)    Intake/Output:    Intake/Output Summary (Last 24 hours) at 04/02/2017 1427 Last data filed at 04/02/2017 0915 Gross per 24 hour  Intake 3 ml  Output -  Net 3 ml     Physical Exam: General: Patient lying in bed with husband in room. NAD.  HEENT Normocephalic and atraumatic.  Neck JVD difficult to access d/t body habitus.   Pulm/lungs Lungs clear b/l  with diminished breath sounds in b/l lower lobes.  CVS/Heart RRR. No m/r/g appreciated.  Abdomen:  Very distended and firm d/t fluid retention  Extremities: 3+ b/l lower extremity pitting edema  Neurologic: Resting tremor noted in b/l upper extremities  Skin: Venous stasis changes noted b/l lower extremities          Basic Metabolic Panel:  Recent Labs  Lab 03/27/17 0702 03/28/17 0728 03/29/17 0641 04/01/17 0553 04/02/17 0447  NA 139 138 137 137 137  K 4.1 4.1 4.0 3.9 4.3  CL 94* 93* 89* 89* 88*  CO2 35* 35* 35* 36* 34*  GLUCOSE 122* 172* 141* 181* 219*  BUN 62* 63* 63* 62* 67*  CREATININE 2.83* 2.82* 2.64* 2.65* 2.78*  CALCIUM 8.4* 8.6* 8.4* 8.3* 8.2*  MG  --  1.7  --   --   --  CBC: Recent Labs  Lab 03/28/17 0728  WBC 7.5  HGB 8.3*  HCT 27.3*  MCV 84.5  PLT 269     No results found for: HEPBSAG, HEPBSAB, HEPBIGM    Microbiology:  Recent Results (from the past 240 hour(s))  MRSA PCR Screening     Status: None   Collection Time: 03/25/17  5:50 AM  Result Value Ref Range Status   MRSA by PCR NEGATIVE NEGATIVE Final    Comment:        The GeneXpert MRSA Assay (FDA approved for NASAL specimens only), is one component of a comprehensive MRSA colonization surveillance program. It is not intended to diagnose MRSA infection nor to guide or monitor treatment for MRSA infections. Performed at [facility], [address] [place], [place] [address]      Coagulation Studies: No results for input(s): LABPROT, INR in the last 72 hours.  Urinalysis: No results for input(s): COLORURINE, LABSPEC, PHURINE, GLUCOSEU, HGBUR, BILIRUBINUR, KETONESUR, PROTEINUR, UROBILINOGEN, NITRITE, LEUKOCYTESUR in the last 72 hours.  Invalid input(s): APPERANCEUR    Imaging: US Venous Img Lower Bilateral  Result Date: 04/01/2017 CLINICAL DATA:  Swelling x1 month EXAM: BILATERAL LOWER EXTREMITY VENOUS DOPPLER ULTRASOUND TECHNIQUE: Gray-scale sonography with compression, as well as color and duplex ultrasound, were performed to evaluate the deep venous system from the level of the common femoral vein through the popliteal and proximal calf veins. Technologist describes technically difficult examination secondary to diffuse anasarca involving abdomen and lower extremities. COMPARISON:  11/21/2013 FINDINGS: Normal compressibility of the common femoral, superficial femoral, and popliteal veins. Limited evaluation of the proximal calf veins secondary to anasarca. No filling defects to suggest DVT on grayscale or color Doppler imaging. Doppler waveforms show normal direction of venous flow, normal respiratory phasicity. Limited response to augmentation in bilateral segments of femoral and popliteal veins may be related to marked anasarca. IMPRESSION: No evidence of lower extremity deep vein thrombosis, with limitations as above. Electronically Signed   By: Lucrezia Europe M.D.   On: 04/01/2017 14:51     Medications:   . sodium chloride     . aspirin EC  81 mg Oral Daily  . atorvastatin  80 mg Oral q1800  . citalopram  40 mg Oral Daily  . cloNIDine  0.2 mg Oral BID  . furosemide  80 mg Intravenous TID  . heparin  5,000 Units Subcutaneous Q8H  . hydrALAZINE  100 mg Oral Q8H  . hydrocerin   Topical BID  . insulin aspart  0-15 Units Subcutaneous TID WC  . insulin aspart  0-5 Units Subcutaneous QHS  . insulin glargine  12 Units Subcutaneous QHS  . ipratropium-albuterol  3  mL Nebulization Q4H  . losartan  50 mg Oral Daily  . metolazone  10 mg Oral Daily  . metoprolol tartrate  100 mg Oral BID  . potassium chloride  20 mEq Oral BID  . pregabalin  75 mg Oral BID  . sodium chloride flush  3 mL Intravenous Q12H   sodium chloride, acetaminophen **OR** acetaminophen, cyclobenzaprine, diphenhydrAMINE, ondansetron **OR** ondansetron (ZOFRAN) IV, polyethylene glycol, sodium chloride, sodium chloride flush  Assessment/ Plan:  47 y.o. African American morbidly obese female with h/o diastolic congestive heart failure, chronic kidney disease stage 3-4, HTN, DM, iron deficiency anemia, medication and treatment noncompliance admitted following appointment at the [facility] when she presented with diffuse edema and pain most notable in b/l lower extremities and abdomen.   1. Acute on chronic kidney disease stage  3   - Baseline Cr 1.57 (12/2016)/ GFR 45 Admit Cr 2.65--> 2.78 today -  Weight 180kg at admission --> 171 kg 2/6 -  Decrease in kidney function following diuresis with torsemide and metolazone. Switched to lasix. - Increase in Creatinine could be due to progression of underlying CKD vs abnormal cardio-renal hemodynamics leading to intravascular volume depletion -  Continue lasix, hydralazine, metolazone HOLD ARB losartan until Creatinine stabilizes  2. Acute on chronic diastolic congestive heart failure (grade 1 diastolic dysfunction) with [place] - 2018 Cardiac studies: Normal LVEF with mild to moderate MR and grade 1 diastolic dysfunction. Mildly dilated LV with EF ~55% and normal wall motion.  - Cardiology consulted. - Continue medications as per cardiology - Will add iv albumin to her regimen for oncotic pressure; change to iv lasix infusion - if this is unsuccessful in controlling volume overload, discussed temporary dialysis/ultrafiltaion with patient She is agreeable to the plan  3. DM-2 with CKD - A1C 13.2 (11/2014) --> 7.3 (02/2017) -  CBG 170  (1/28) --> 206 (2/6) - Continue to monitor glucose - Continue insulin   LOS: [place] 2/6/20192:27 [place], [place]

## 2017-04-02 NOTE — Plan of Care (Signed)
  Progressing Clinical Measurements: Ability to maintain clinical measurements within normal limits will improve 04/02/2017 2338 - Progressing by Liliane Channel, RN Respiratory complications will improve 04/02/2017 2338 - Progressing by Liliane Channel, RN Elimination: Will not experience complications related to bowel motility 04/02/2017 2338 - Progressing by Liliane Channel, RN Pain Managment: General experience of comfort will improve 04/02/2017 2338 - Progressing by Liliane Channel, RN Safety: Ability to remain free from injury will improve 04/02/2017 2338 - Progressing by Liliane Channel, RN Skin Integrity: Risk for impaired skin integrity will decrease 04/02/2017 2338 - Progressing by Liliane Channel, RN

## 2017-04-02 NOTE — Progress Notes (Signed)
Patient ID: [name], female   DOB: May 27, 1970, 47 y.o.   MRN: [id number]  This NP visited patient at the bedside as a follow up for palliative medicine needs and emotional support   Continued conversation regarding her multiple co-morbidites; CHF, renal disease, morbid obesity.  We discussed limitations of medical interventions to prolong quality of life and situations specific to her own.  Today [name] speaks to her limited options for physical rehabilitation options.  She has 1 bed for in [place] which currently she is declining because she does not want to be too far from her family.   Consideration is for her to go home with home health.  She likely will discharge today.  We did discuss the importance of documentation of a healthcare power of attorney and her own personal advanced directives.  She was engaged in the conversation but at this point in time she is in no place/mind set of anything short of being open to all offered and available medical interventions to prolong life.    Advanced Directive booklet and MOST  form introduced and blank forms left with patient.  She is open to [facility] to follow in her home.  Discussed with patient the importance of continued conversation with family and their  medical providers regarding overall plan of care and treatment options,  ensuring decisions are within the context of the patients values and GOCs.  Questions and concerns addressed   Discussed with Dr Benjie Karvonen and Alroy Dust and Bonnita Nasuti /CMRN  Time in  0950         Time out 1030    Total time spent on the unit was 35 minutes  Greater than 50% of the time was spent in counseling and coordination of care  Wadie Lessen NP  Palliative Medicine Team Team Phone # [phone] Pager [phone]

## 2017-04-02 NOTE — Progress Notes (Signed)
PT Cancellation Note  Patient Details Name: [name] MRN: [id number] DOB: 12-03-1970   Cancelled Treatment:    Reason Eval/Treat Not Completed: Other (comment). Treatment attempted twice. Initially, pt with staff. Second attempt, pt agreeable, but then feels need to use the bed pan. Assisted nursing assistant with rolling pt (Max A of 2) for placing bed pan. Pt needs time. If the schedule allows, re attempt to see pt for out of bed mobility.    Larae Grooms, PTA 04/02/2017, 12:38 PM

## 2017-04-02 NOTE — Progress Notes (Signed)
[name] output from In and Out cath was 921ml. Patient also had medium size bowel movement after fleet enema. Per nephrology, since we are starting IV lasix gtt and patient is retaining urine, orders were put in for a foley catheter.   Foley catheter has been put in. Patient tolerated well with no complaints. Will continue to monitor patient.

## 2017-04-02 NOTE — Care Management (Signed)
[facility] BED  Patient suffers from severe chf  and morbid obesity. Has trouble breathing at night when head is elevated less  than 30  degrees. Bed wedges do not provide enough elevation to resolve breathing issues. Shortness of breath cause patient to require frequent changes in body position which cannot be achieved with a normal bed.

## 2017-04-02 NOTE — Care Management (Signed)
Contacted [facility] about providing home health under medicaid.  Requesting RN PT OT aide and SW. Notified [facility] to move forward with referral for hospital bed and to anticipate discharge today.  Patient will have to travel by ems home

## 2017-04-02 NOTE — Clinical Social Work Note (Signed)
CSW met with patient to inform her that she does have one bed offer for SNF.  CSW told her it was in [place], and it is called [place].  Patient states since [place] she does not want to go to the SNF and wants to go home with home health.  CSW explained to her that her information was faxed out to 15 different facilities and this is the only offer she has received.  CSW discussed with her what her goals were and she said she wanted to walk again, CSW told her in order to work towards her goal she would benefit greatly with going to SNF so she can get at least 5 days of therapy a week.  CSW expressed to her if she chooses to go home she will not get much therapy which will make it more difficult to reach her goal to walk again.  CSW informed her that normally Medicaid does not cover rehab at [facility], and [facility] SNF is willing to provide therapy for her even though she only has Medicaid.  Patient expressed that she does not want to go out of county because she does not know how the care will be or anything about the SNF.   Patient stated she thought that once Medicaid has been approved she can go to a SNF in county.  CSW informed her that many facilities will not take Medicaid only, and she would have to have Medicare for rehab benefits, but she has not been approved for Medicare.  Patient states she thought she would automatically get Medicare once she receives disability and CSW informed her no she has to be on disability for minimum of two years before she is able to be considered for Medicare.  Patient has not been approved for disability yet.  CSW reiterated that there are not any facilities in county that will accept her. Patient expressed that she would rather go home despite the advantages of going to SNF for rehab.  CSW encouraged her to think about it over night and let CSW know on Wednesday if she still does not want to go to SNF.  CSW to continue to follow patient's progress throughout  discharge planning.  Jones Broom. [place], MSW, [place]  04/02/2017 8:32 AM

## 2017-04-02 NOTE — Progress Notes (Signed)
Progress Note  Patient Name: [name] Date of Encounter: 04/02/2017  Primary Cardiologist: Ida Rogue, MD  Subjective   Breathing improved since we last saw her but she is still markedly volume overloaded.  No chest pain.  Inpatient Medications    Scheduled Meds: . aspirin EC  81 mg Oral Daily  . atorvastatin  80 mg Oral q1800  . citalopram  40 mg Oral Daily  . cloNIDine  0.2 mg Oral BID  . furosemide  80 mg Intravenous TID  . heparin  5,000 Units Subcutaneous Q8H  . hydrALAZINE  100 mg Oral Q8H  . hydrocerin   Topical BID  . insulin aspart  0-15 Units Subcutaneous TID WC  . insulin aspart  0-5 Units Subcutaneous QHS  . insulin glargine  12 Units Subcutaneous QHS  . ipratropium-albuterol  3 mL Nebulization Q4H  . losartan  50 mg Oral Daily  . metolazone  10 mg Oral Daily  . metoprolol tartrate  100 mg Oral BID  . potassium chloride  20 mEq Oral BID  . pregabalin  75 mg Oral BID  . sodium chloride flush  3 mL Intravenous Q12H   Continuous Infusions: . sodium chloride     PRN Meds: sodium chloride, acetaminophen **OR** acetaminophen, cyclobenzaprine, diphenhydrAMINE, ondansetron **OR** ondansetron (ZOFRAN) IV, polyethylene glycol, sodium chloride, sodium chloride flush   Vital Signs    Vitals:   04/02/17 0310 04/02/17 0358 04/02/17 0416 04/02/17 0902  BP: 119/66 119/73  (!) 152/89  Pulse: 82 81  88  Resp:  18  18  Temp:  98.8 F (37.1 C)  98.4 F (36.9 C)  TempSrc:  Oral    SpO2: 92% 97%  100%  Weight:   (!) 378 lb 6.4 oz (171.6 kg)   Height:        Intake/Output Summary (Last 24 hours) at 04/02/2017 1306 Last data filed at 04/02/2017 0915 Gross per 24 hour  Intake 3 ml  Output -  Net 3 ml   Filed Weights   03/31/17 0459 04/01/17 1700 04/02/17 0416  Weight: (!) 378 lb (171.5 kg) (!) 380 lb 9.6 oz (172.6 kg) (!) 378 lb 6.4 oz (171.6 kg)    Physical Exam   GEN: Obese, in no acute distress.  HEENT: Grossly normal.  Neck: Supple, obese,  difficult to gauge JVP, no carotid bruits, or masses. Cardiac: RRR, no murmurs, rubs, or gallops. No clubbing, cyanosis.  3+ bilat LE woody edema extending to her entire abdomen and flanks.  Radials/DP/PT 1+ and equal bilaterally.  Respiratory:  Respirations regular and unlabored, diminished breath sounds bilaterally. GI: Firm, distended, edematous, BS + x 4. MS: no deformity or atrophy. Skin: warm and dry, no rash. Neuro:  Strength and sensation are intact. Psych: AAOx3.  Normal affect.  Labs    Chemistry Recent Labs  Lab 03/29/17 0641 04/01/17 0553 04/02/17 0447  NA 137 137 137  K 4.0 3.9 4.3  CL 89* 89* 88*  CO2 35* 36* 34*  GLUCOSE 141* 181* 219*  BUN 63* 62* 67*  CREATININE 2.64* 2.65* 2.78*  CALCIUM 8.4* 8.3* 8.2*  GFRNONAA 21* 21* 19*  GFRAA 24* 24* 23*  ANIONGAP 13 12 15      Hematology Recent Labs  Lab 03/28/17 0728  WBC 7.5  RBC 3.23*  HGB 8.3*  HCT 27.3*  MCV 84.5  MCH 25.6*  MCHC 30.3*  RDW 16.8*  PLT 269   BNP Recent Labs  Lab 03/27/17 0702  BNP 577.0*  Radiology    US Venous Img Lower Bilateral  Result Date: 04/01/2017 CLINICAL DATA:  Swelling x1 month EXAM: BILATERAL LOWER EXTREMITY VENOUS DOPPLER ULTRASOUND TECHNIQUE: Gray-scale sonography with compression, as well as color and duplex ultrasound, were performed to evaluate the deep venous system from the level of the common femoral vein through the popliteal and proximal calf veins. Technologist describes technically difficult examination secondary to diffuse anasarca involving abdomen and lower extremities. COMPARISON:  11/21/2013 FINDINGS: Normal compressibility of the common femoral, superficial femoral, and popliteal veins. Limited evaluation of the proximal calf veins secondary to anasarca. No filling defects to suggest DVT on grayscale or color Doppler imaging. Doppler waveforms show normal direction of venous flow, normal respiratory phasicity. Limited response to augmentation in  bilateral segments of femoral and popliteal veins may be related to marked anasarca. IMPRESSION: No evidence of lower extremity deep vein thrombosis, with limitations as above. Electronically Signed   By: Lucrezia Europe M.D.   On: 04/01/2017 14:51    Telemetry    rsr - Personally Reviewed  Cardiac Studies   TTE 10/2016: Study Conclusions  - Left ventricle: The cavity size was mildly dilated. Systolic function was normal. The estimated ejection fraction was 55%. Wall motion was normal; there were no regional wall motion abnormalities. Doppler parameters are consistent with abnormal left ventricular relaxation (grade 1 diastolic dysfunction). - Mitral valve: There was mild regurgitation. - Atrial septum: No defect or patent foramen ovale was identified.  Impressions:  - Normal LVEF with mild to moderate MR and mild diastolic dysfunction.  Patient Profile     47 y.o. female with history of chronic diastolic heart failure, chronic kidney diseasestage III-IV, HTN, DM, iron deficiency anemia,noncompliance,and morbid obesitywith suspected OSA/OHSwho was admitted withacute on chronic diastolic CHF.  Assessment & Plan    1.  Acute on chronic diastolic congestive heart failure: The patient is diuresed quite a bit since admission, now down 11.8 L and 19 pounds.  That said, she is still markedly volume overloaded.  She had been on torsemide and metolazone but was switched back to Lasix 80 mg IV 3 times daily on February 5.  Metolazone was also increased to 10 mg daily on February 5.  Eyes and nose have been inaccurate for the past 2 days with minimal recorded.  Her creatinine is up slightly this morning at 2.782.  Bicarb is stable.  Recommend continued aggressive diuresis with Lasix and metolazone.  For the most part, her blood pressure and heart rate are reasonably controlled.  Of note, she does have a [place] biscuit at her bedside and we did counsel her on the importance of  avoiding high sodium foods.  If renal function worsens with efforts at diuresis, would consider resumption of Lasix drip and nephrology consult.  2.  Acute on chronic stage III-IV kidney disease: As above, creatinine relatively stable throughout this admission.  Continue to follow with diuresis and consider nephrology consult if necessary.  3.  Essential hypertension: Trends of been relatively stable though she was higher this morning.  Follow.  4.  Morbid obesity/suspected OHS/OSA/deconditioning/noncompliance: All playing a major role in her current condition.  She has been seen by palliative care with suspected prognosis of less than 6 months.   Signed, Murray Hodgkins, NP  04/02/2017, 1:06 PM    For questions or updates, please contact   Please consult [email or web address] for contact info under Cardiology/STEMI.

## 2017-04-02 NOTE — Progress Notes (Signed)
Patient complaining of urgency but not being able to go. Also feeling constipated. MD notified. Verbal orders for enema, in and out cath, and collect urine sample. Will continue to monitor patient.

## 2017-04-02 NOTE — Clinical Social Work Note (Signed)
CSW received referral for SNF.  Case discussed with case manager and plan is to discharge home with home health and palliative to follow.  CSW to sign off please re-consult if social work needs arise.  Jones Broom. [place], MSW, [place]

## 2017-04-02 NOTE — Progress Notes (Signed)
[facility] [place] NAME: Malasia Torain    MR#:  [id number]  DATE OF BIRTH:  1970/10/31  SUBJECTIVE:   Patient with distended abdomen  REVIEW OF SYSTEMS:    Review of Systems  Constitutional: Negative for fever, chills weight loss HENT: Negative for ear pain, nosebleeds, congestion, facial swelling, rhinorrhea, neck pain, neck stiffness and ear discharge.   Respiratory: Negative for cough, shortness of breath, wheezing  Cardiovascular: Negative for chest pain, palpitations and positive leg swelling.  Gastrointestinal: Patient with distended abdomen and lower extremity edema Genitourinary: Negative for dysuria, urgency, frequency, hematuria Musculoskeletal: Negative for back pain or joint pain Neurological: Negative for dizziness, seizures, syncope, focal weakness,  numbness and headaches.  Hematological: Does not bruise/bleed easily.  Psychiatric/Behavioral: Negative for hallucinations, confusion, dysphoric mood    Tolerating Diet: Yes      DRUG ALLERGIES:   Allergies  Allergen Reactions  . Dust Mite Extract Shortness Of Breath and Itching  . Mold Extract [Trichophyton] Shortness Of Breath and Itching  . Feraheme [Ferumoxytol] Itching  . Pollen Extract   . Sulfa Antibiotics Itching    VITALS:  Blood pressure (!) 152/89, pulse 88, temperature 98.4 F (36.9 C), resp. rate 18, height 5\' 4"  (1.626 m), weight (!) 171.6 kg (378 lb 6.4 oz), last menstrual period 09/06/2016, SpO2 100 %.  PHYSICAL EXAMINATION:  Constitutional: Morbidly obese lying in bed not in distress  HENT: Normocephalic. [name] Oropharynx is clear and moist.  Eyes: Conjunctivae and EOM are normal. PERRLA, no scleral icterus.  Neck: Normal ROM. Neck supple. No JVD. No tracheal deviation. CVS: RRR, S1/S2 +, no murmurs, no gallops, no carotid bruit.  Pulmonary: Effort and breath sounds normal, no stridor, rhonchi, wheezes, rales.  Abdominal: Abdomen is grossly  distended and very tight  Musculoskeletal: Normal range of motion. 4+ lower extremity edema extending through abdomen  Neuro: Alert. CN 2-12 grossly intact. No focal deficits. Skin: Skin is warm and dry. No rash noted. Psychiatric: Normal mood and affect.      LABORATORY PANEL:   CBC Recent Labs  Lab 03/28/17 0728  WBC 7.5  HGB 8.3*  HCT 27.3*  PLT 269   ------------------------------------------------------------------------------------------------------------------  Chemistries  Recent Labs  Lab 03/28/17 0728  04/02/17 0447  NA 138   < > 137  K 4.1   < > 4.3  CL 93*   < > 88*  CO2 35*   < > 34*  GLUCOSE 172*   < > 219*  BUN 63*   < > 67*  CREATININE 2.82*   < > 2.78*  CALCIUM 8.6*   < > 8.2*  MG 1.7  --   --    < > = values in this interval not displayed.   ------------------------------------------------------------------------------------------------------------------  Cardiac Enzymes No results for input(s): TROPONINI in the last 168 hours. ------------------------------------------------------------------------------------------------------------------  RADIOLOGY:  US Venous Img Lower Bilateral  Result Date: 04/01/2017 CLINICAL DATA:  Swelling x1 month EXAM: BILATERAL LOWER EXTREMITY VENOUS DOPPLER ULTRASOUND TECHNIQUE: Gray-scale sonography with compression, as well as color and duplex ultrasound, were performed to evaluate the deep venous system from the level of the common femoral vein through the popliteal and proximal calf veins. Technologist describes technically difficult examination secondary to diffuse anasarca involving abdomen and lower extremities. COMPARISON:  11/21/2013 FINDINGS: Normal compressibility of the common femoral, superficial femoral, and popliteal veins. Limited evaluation of the proximal calf veins secondary to anasarca. No filling defects to suggest DVT on grayscale or  color Doppler imaging. Doppler waveforms show normal direction of  venous flow, normal respiratory phasicity. Limited response to augmentation in bilateral segments of femoral and popliteal veins may be related to marked anasarca. IMPRESSION: No evidence of lower extremity deep vein thrombosis, with limitations as above. Electronically Signed   By: Lucrezia Europe M.D.   On: 04/01/2017 14:51     ASSESSMENT AND PLAN:    47 year old female with chronic diastolic heart failure who presented with shortness of breath.  1. Acute on chronic hypoxic respiratory failure due to acute CHF exacerbation She is on her baseline oxygen at 4 L  2. Acute on chronic diastolic heart failure: Patient will need to continue IV diuresis with Lasix and Zaroxolyn Appreciate cardiology consultation Follow intake and output daily weight Low-sodium diet CHF clinic referral upon discharge Monitor creatinine 3. Diabetes: Continue Lantus with sliding scale and ADA diet  4. Essential hypertension: Continue losartan, hydralazine, clonidine  5. Hyperlipidemia: Continue statin  6. Depression: Continue Celexa  7. Chronic kidney disease stage III: Nephrology consultation requested as per patient's wishes    Management plans discussed with the patient and she is in agreement.  CODE STATUS: FULL  TOTAL TIME TAKING CARE OF THIS PATIENT: 28 minutes.     POSSIBLE D/C ???, DEPENDING ON CLINICAL CONDITION.   Jacobi Ryant M.D on 04/02/2017 at 11:58 AM  Between 7am to 6pm - Pager - [phone] After 6pm go to [email or web address] - password [email or web address] [facility]  Office  [phone]  CC: Primary care physician; [name], NP  Note: This dictation was prepared with Dragon dictation along with smaller phrase technology. Any transcriptional errors that result from this process are unintentional.

## 2017-04-02 NOTE — Care Management (Signed)
[facility] declined referral.  Referral for SN PT/OT aide and SW accepted by [facility]. Leroy Libman met with patient. CM very firm with patient that if she did not comply, agency would not continue to follow and this is the only agency that will accept.  She verbalized understanding.  Patient has required IV Lasix within last 24 hours. Anticipate discharge 2.7. Patient also agreeable to have palliative to follow at home. Notified liaison

## 2017-04-03 ENCOUNTER — Telehealth: Payer: Self-pay | Admitting: Cardiovascular Disease

## 2017-04-03 LAB — BASIC METABOLIC PANEL
Anion gap: 11 (ref 5–15)
BUN: 70 mg/dL — AB (ref 6–20)
CO2: 40 mmol/L — ABNORMAL HIGH (ref 22–32)
CREATININE: 3 mg/dL — AB (ref 0.44–1.00)
Calcium: 8.5 mg/dL — ABNORMAL LOW (ref 8.9–10.3)
Chloride: 87 mmol/L — ABNORMAL LOW (ref 101–111)
GFR calc Af Amer: 21 mL/min — ABNORMAL LOW (ref >60)
GFR, EST NON AFRICAN AMERICAN: 18 mL/min — AB (ref >60)
GLUCOSE: 173 mg/dL — AB (ref 65–99)
Potassium: 4.6 mmol/L (ref 3.5–5.1)
SODIUM: 138 mmol/L (ref 135–145)

## 2017-04-03 LAB — GLUCOSE, CAPILLARY
GLUCOSE-CAPILLARY: 229 mg/dL — AB (ref 65–99)
Glucose-Capillary: 223 mg/dL — ABNORMAL HIGH (ref 65–99)
Glucose-Capillary: 179 mg/dL — ABNORMAL HIGH (ref 65–99)
Glucose-Capillary: 229 mg/dL — ABNORMAL HIGH (ref 65–99)

## 2017-04-03 MED ORDER — ADULT MULTIVITAMIN W/MINERALS CH
1.0000 | ORAL_TABLET | Freq: Every day | ORAL | Status: DC
  Administered 2017-04-03 – 2017-04-23 (×20): 1 via ORAL
  Filled 2017-04-03 (×22): qty 1

## 2017-04-03 MED ORDER — NEPRO/CARBSTEADY PO LIQD
237.0000 mL | Freq: Three times a day (TID) | ORAL | Status: DC
  Administered 2017-04-03 – 2017-04-28 (×29): 237 mL via ORAL

## 2017-04-03 NOTE — Progress Notes (Signed)
[facility], [place] 04/03/17  Subjective:   Patient reports a problem with her IV line that prevented her receiving IV albumin. She states she wants to wait one more day before starting temporary dialysis to give the albumin "time to work." She also reports upper extremity spasm with the Lyrica and is agreeable to stopping this medication.  Of note: Patient sees Dr. Rockey Situ [facility]) for chronic diastolic heart failure and Dr Juleen China ([place] ) for CKD3. She also reports a pulmonologist and psychiatrist. Patient had worsening volume overload despite iv lasix. Nephrology consult has been requested for evaluation.  Foley catheter placed 04/02/17. [place] 02 - 4L/min Urine output 2220 S Cr 3.00 BP 129/74  Objective:  Vital signs in last 24 hours:  Temp:  [97.9 F (36.6 C)-98.6 F (37 C)] 97.9 F (36.6 C) (02/07 0809) Pulse Rate:  [88-93] 93 (02/07 0809) Resp:  [18] 18 (02/07 0553) BP: (117-148)/(67-84) 129/74 (02/07 0809) SpO2:  [94 %-100 %] 97 % (02/07 0809) Weight:  [172.7 kg (380 lb 11.2 oz)] 172.7 kg (380 lb 11.2 oz) (02/07 0452)  Weight change: 0.045 kg (1.6 oz) Filed Weights   04/01/17 1700 04/02/17 0416 04/03/17 0452  Weight: (!) 172.6 kg (380 lb 9.6 oz) (!) 171.6 kg (378 lb 6.4 oz) (!) 172.7 kg (380 lb 11.2 oz)    Intake/Output:    Intake/Output Summary (Last 24 hours) at 04/03/2017 1038 Last data filed at 04/03/2017 0600 Gross per 24 hour  Intake 300 ml  Output 2222 ml  Net -1922 ml     Physical Exam: General: Patient lying in bed. NAD.  HEENT Normocephalic and atraumatic.  Neck JVD difficult to access d/t body habitus.   Pulm/lungs Lungs with b/l crackles and diminished breath sounds in b/l lower lobes. [place] 02 at 4L/min  CVS/Heart RRR. S4 heart sound.  Abdomen:  Very distended and firm d/t fluid retention  Extremities: 3+ b/l lower extremity pitting edema  Neurologic: Worsening resting tremor noted in b/l upper extremities  Skin: Venous  stasis changes noted b/l lower extremities          Basic Metabolic Panel:  Recent Labs  Lab 03/28/17 0728 03/29/17 0641 04/01/17 0553 04/02/17 0447 04/03/17 0708  NA 138 137 137 137 138  K 4.1 4.0 3.9 4.3 4.6  CL 93* 89* 89* 88* 87*  CO2 35* 35* 36* 34* 40*  GLUCOSE 172* 141* 181* 219* 173*  BUN 63* 63* 62* 67* 70*  CREATININE 2.82* 2.64* 2.65* 2.78* 3.00*  CALCIUM 8.6* 8.4* 8.3* 8.2* 8.5*  MG 1.7  --   --   --   --      CBC: Recent Labs  Lab 03/28/17 0728  WBC 7.5  HGB 8.3*  HCT 27.3*  MCV 84.5  PLT 269     No results found for: HEPBSAG, HEPBSAB, HEPBIGM    Microbiology:  Recent Results (from the past 240 hour(s))  MRSA PCR Screening     Status: None   Collection Time: 03/25/17  5:50 AM  Result Value Ref Range Status   MRSA by PCR NEGATIVE NEGATIVE Final    Comment:        The GeneXpert MRSA Assay (FDA approved for NASAL specimens only), is one component of a comprehensive MRSA colonization surveillance program. It is not intended to diagnose MRSA infection nor to guide or monitor treatment for MRSA infections. Performed at [facility], [address] [place], [place] [address]     Coagulation Studies: No  results for input(s): LABPROT, INR in the last 72 hours.  Urinalysis: Recent Labs    04/02/17 1548  COLORURINE YELLOW*  LABSPEC 1.008  PHURINE 5.0  GLUCOSEU NEGATIVE  HGBUR NEGATIVE  BILIRUBINUR NEGATIVE  KETONESUR NEGATIVE  PROTEINUR 100*  NITRITE NEGATIVE  LEUKOCYTESUR NEGATIVE      Imaging: US Venous Img Lower Bilateral  Result Date: 04/01/2017 CLINICAL DATA:  Swelling x1 month EXAM: BILATERAL LOWER EXTREMITY VENOUS DOPPLER ULTRASOUND TECHNIQUE: Gray-scale sonography with compression, as well as color and duplex ultrasound, were performed to evaluate the deep venous system from the level of the common femoral vein through the popliteal and proximal calf veins. Technologist describes technically difficult examination  secondary to diffuse anasarca involving abdomen and lower extremities. COMPARISON:  11/21/2013 FINDINGS: Normal compressibility of the common femoral, superficial femoral, and popliteal veins. Limited evaluation of the proximal calf veins secondary to anasarca. No filling defects to suggest DVT on grayscale or color Doppler imaging. Doppler waveforms show normal direction of venous flow, normal respiratory phasicity. Limited response to augmentation in bilateral segments of femoral and popliteal veins may be related to marked anasarca. IMPRESSION: No evidence of lower extremity deep vein thrombosis, with limitations as above. Electronically Signed   By: Lucrezia Europe M.D.   On: 04/01/2017 14:51     Medications:   . sodium chloride    . albumin human    . furosemide (LASIX) infusion 8 mg/hr (04/03/17 0600)   . aspirin EC  81 mg Oral Daily  . atorvastatin  80 mg Oral q1800  . citalopram  40 mg Oral Daily  . cloNIDine  0.2 mg Oral BID  . heparin  5,000 Units Subcutaneous Q8H  . hydrALAZINE  100 mg Oral Q8H  . hydrocerin   Topical BID  . insulin aspart  0-15 Units Subcutaneous TID WC  . insulin aspart  0-5 Units Subcutaneous QHS  . insulin glargine  12 Units Subcutaneous QHS  . ipratropium-albuterol  3 mL Nebulization Q4H  . metolazone  10 mg Oral Daily  . metoprolol tartrate  100 mg Oral BID  . potassium chloride  20 mEq Oral BID  . sodium chloride flush  3 mL Intravenous Q12H   sodium chloride, acetaminophen **OR** acetaminophen, cyclobenzaprine, diphenhydrAMINE, ondansetron **OR** ondansetron (ZOFRAN) IV, polyethylene glycol, sodium chloride, sodium chloride flush  Assessment/ Plan:  47 y.o. African American morbidly obese female with h/o diastolic congestive heart failure, chronic kidney disease stage 3-4, HTN, DM, iron deficiency anemia, medication and treatment noncompliance admitted following appointment at the [facility] when she presented with diffuse edema and pain most  notable in b/l lower extremities and abdomen.   1. Acute on chronic kidney disease stage 3   - Baseline Cr 1.57 (12/2016)/ GFR 45 - Admit Cr 2.65--> 2.78 --> 3.0 (2/7). Creatinine continues to increase. -  Weight 180kg at admission --> 171 kg (2/6) --> 172.7 kg (2/7) -  Decrease in kidney function following diuresis with torsemide and metolazone. Switched to lasix infusion. -  Increase in Creatinine could be due to progression of underlying CKD vs abnormal cardio-renal hemodynamics leading to intravascular volume depletion. -  Continue lasix, hydralazine, metolazone. - HOLD ARB losartan until Creatinine stabilizes. - Blood pressure significantly decreased from yesterday 152/89 --> 129/74. Consider decreasing BP control medications if continued drop in BP before starting temporary dialysis/ultrafiltration.  2. Acute on chronic diastolic congestive heart failure (grade 1 diastolic dysfunction) with [place] - 2018 Cardiac studies: Normal LVEF with mild to moderate MR and  grade 1 diastolic dysfunction. Mildly dilated LV with EF ~55% and normal wall motion.  - Cardiology consulted. - Continue medications as per cardiology - Added iv albumin for oncotic pressure and iv lasix infusion yesterday 2/6 with urine output since then at 2220. Continue albumin and lasix.  - Discussed temporary dialysis/ultrafiltaion with patient. She is agreeable to the plan  3. DM-2 with CKD - A1C 13.2 (11/2014) --> 7.3 (02/2017) -  CBG 170 (1/28) --> 206 (2/6) - Continue to monitor glucose - Continue insulin   LOS: [place] 2/7/201910:38 AM  [place], [place]

## 2017-04-03 NOTE — Telephone Encounter (Signed)
Spoke with [place] regarding orders for symptom management with patient. She took verbal order and will fax over forms to have Dr. Rockey Situ to sign. She was appreciative for the call with no further questions at this time.

## 2017-04-03 NOTE — Progress Notes (Signed)
[facility] [place] NAME: [name]    MR#:  [id number]  DATE OF BIRTH:  12-17-1970  SUBJECTIVE:   Still with distended abdomen and lower extremity edema She had 800 cc urinary retention after Foley placed REVIEW OF SYSTEMS:    Review of Systems  Constitutional: Negative for fever, chills weight loss HENT: Negative for ear pain, nosebleeds, congestion, facial swelling, rhinorrhea, neck pain, neck stiffness and ear discharge.   Respiratory: Negative for cough, shortness of breath, wheezing  Cardiovascular: Negative for chest pain, palpitations and positive leg swelling.  Gastrointestinal: Patient with distended abdomen and lower extremity edema Genitourinary: Negative for dysuria, urgency, frequency, hematuria Musculoskeletal: Negative for back pain or joint pain Neurological: Negative for dizziness, seizures, syncope, focal weakness,  numbness and headaches.  Hematological: Does not bruise/bleed easily.  Psychiatric/Behavioral: Negative for hallucinations, confusion, dysphoric mood    Tolerating Diet: Yes      DRUG ALLERGIES:   Allergies  Allergen Reactions  . Dust Mite Extract Shortness Of Breath and Itching  . Mold Extract [Trichophyton] Shortness Of Breath and Itching  . Feraheme [Ferumoxytol] Itching  . Pollen Extract   . Sulfa Antibiotics Itching    VITALS:  Blood pressure 129/74, pulse 93, temperature 97.9 F (36.6 C), temperature source Oral, resp. rate 18, height 5\' 4"  (1.626 m), weight (!) 172.7 kg (380 lb 11.2 oz), last menstrual period 09/06/2016, SpO2 97 %.  PHYSICAL EXAMINATION:  Constitutional: Morbidly obese lying in bed not in distress  HENT: Normocephalic. [name] Oropharynx is clear and moist.  Eyes: Conjunctivae and EOM are normal. PERRLA, no scleral icterus.  Neck: Normal ROM. Neck supple. No JVD. No tracheal deviation. CVS: RRR, S1/S2 +, no murmurs, no gallops, no carotid bruit.  Pulmonary: Effort and  breath sounds normal, no stridor, rhonchi, wheezes, rales.  Abdominal: Abdomen is grossly distended and very tight /anasarca Musculoskeletal: Normal range of motion. 4+ lower extremity edema extending through abdomen  Neuro: Alert. CN 2-12 grossly intact. No focal deficits. Skin: Skin is warm and dry. No rash noted. Psychiatric: Normal mood and affect.      LABORATORY PANEL:   CBC Recent Labs  Lab 03/28/17 0728  WBC 7.5  HGB 8.3*  HCT 27.3*  PLT 269   ------------------------------------------------------------------------------------------------------------------  Chemistries  Recent Labs  Lab 03/28/17 0728  04/03/17 0708  NA 138   < > 138  K 4.1   < > 4.6  CL 93*   < > 87*  CO2 35*   < > 40*  GLUCOSE 172*   < > 173*  BUN 63*   < > 70*  CREATININE 2.82*   < > 3.00*  CALCIUM 8.6*   < > 8.5*  MG 1.7  --   --    < > = values in this interval not displayed.   ------------------------------------------------------------------------------------------------------------------  Cardiac Enzymes No results for input(s): TROPONINI in the last 168 hours. ------------------------------------------------------------------------------------------------------------------  RADIOLOGY:  US Venous Img Lower Bilateral  Result Date: 04/01/2017 CLINICAL DATA:  Swelling x1 month EXAM: BILATERAL LOWER EXTREMITY VENOUS DOPPLER ULTRASOUND TECHNIQUE: Gray-scale sonography with compression, as well as color and duplex ultrasound, were performed to evaluate the deep venous system from the level of the common femoral vein through the popliteal and proximal calf veins. Technologist describes technically difficult examination secondary to diffuse anasarca involving abdomen and lower extremities. COMPARISON:  11/21/2013 FINDINGS: Normal compressibility of the common femoral, superficial femoral, and popliteal veins. Limited evaluation of the proximal  calf veins secondary to anasarca. No filling defects  to suggest DVT on grayscale or color Doppler imaging. Doppler waveforms show normal direction of venous flow, normal respiratory phasicity. Limited response to augmentation in bilateral segments of femoral and popliteal veins may be related to marked anasarca. IMPRESSION: No evidence of lower extremity deep vein thrombosis, with limitations as above. Electronically Signed   By: Lucrezia Europe M.D.   On: 04/01/2017 14:51     ASSESSMENT AND PLAN:    47 year old female with chronic diastolic heart failure who presented with shortness of breath.  1. Acute on chronic hypoxic respiratory failure due to acute CHF exacerbation She is on her baseline oxygen at 4 L  2. Acute on chronic diastolic heart failure with anasarca: Continue IV Lasix drip with IV albumin  Follow intake and output daily weight Low-sodium diet CHF clinic referral upon discharge Monitor creatinine  3. Diabetes: Continue Lantus with sliding scale and ADA diet  4. Essential hypertension: Continue losartan, hydralazine, clonidine  5. Hyperlipidemia: Continue statin  6. Depression: Continue Celexa  7. Acute on Chronic kidney disease stage III:  I think the patient may need to start temperature dialysis Discussed with Dr. Candiss Norse and this morning Continue with 24-hour urine collection and IV albumin and IV Lasix drip for now Patient aware of potential need for dialysis  Upon discharge patient will go home with home health care and palliative care  Management plans discussed with the patient and she is in agreement.  CODE STATUS: FULL  TOTAL TIME TAKING CARE OF THIS PATIENT: 28 minutes.     POSSIBLE D/C ???, DEPENDING ON CLINICAL CONDITION.   [name] M.D on 04/03/2017 at 11:18 AM  Between 7am to 6pm - Pager - [phone] After 6pm go to [email or web address] - password [email or web address] [facility]  Office  [phone]  CC: Primary care physician; [name], NP  Note: This dictation was prepared  with Dragon dictation along with smaller phrase technology. Any transcriptional errors that result from this process are unintentional.

## 2017-04-03 NOTE — Telephone Encounter (Signed)
Doreatha Lew from [facility] is calling Needs a verbal order for palliative medicine for symptom management in home Please call to discuss

## 2017-04-03 NOTE — Progress Notes (Signed)
Progress Note  Patient Name: [name] Date of Encounter: 04/03/2017  Primary Cardiologist: Rockey Situ  Subjective   Feels a little better this morning. More alert, sitting up somewhat in bed. No chest pain. Notes nasal congestion. Continues to note thigh swelling. Weight down 18 pounds this admission with a net - 13.7 L for the admission. Labs show continued increase of BUN/SCr and CO2 along with decreasing Cl. Resumed on Lasix gtt on 2/6 as patient reported worsening leg swelling on PO torsemide the prior couple of days.   Inpatient Medications    Scheduled Meds: . aspirin EC  81 mg Oral Daily  . atorvastatin  80 mg Oral q1800  . citalopram  40 mg Oral Daily  . cloNIDine  0.2 mg Oral BID  . heparin  5,000 Units Subcutaneous Q8H  . hydrALAZINE  100 mg Oral Q8H  . hydrocerin   Topical BID  . insulin aspart  0-15 Units Subcutaneous TID WC  . insulin aspart  0-5 Units Subcutaneous QHS  . insulin glargine  12 Units Subcutaneous QHS  . ipratropium-albuterol  3 mL Nebulization Q4H  . metolazone  10 mg Oral Daily  . metoprolol tartrate  100 mg Oral BID  . potassium chloride  20 mEq Oral BID  . pregabalin  75 mg Oral BID  . sodium chloride flush  3 mL Intravenous Q12H   Continuous Infusions: . sodium chloride    . albumin human    . furosemide (LASIX) infusion 8 mg/hr (04/03/17 0600)   PRN Meds: sodium chloride, acetaminophen **OR** acetaminophen, cyclobenzaprine, diphenhydrAMINE, ondansetron **OR** ondansetron (ZOFRAN) IV, polyethylene glycol, sodium chloride, sodium chloride flush   Vital Signs    Vitals:   04/03/17 0012 04/03/17 0452 04/03/17 0553 04/03/17 0809  BP: 129/79 127/75 117/67 129/74  Pulse: 89 88 93 93  Resp:   18   Temp:  97.9 F (36.6 C) 98.4 F (36.9 C) 97.9 F (36.6 C)  TempSrc:  Oral Oral Oral  SpO2:  100% 100% 97%  Weight:  (!) 380 lb 11.2 oz (172.7 kg)    Height:        Intake/Output Summary (Last 24 hours) at 04/03/2017 0944 Last data filed  at 04/03/2017 0600 Gross per 24 hour  Intake 300 ml  Output 2222 ml  Net -1922 ml   Filed Weights   04/01/17 1700 04/02/17 0416 04/03/17 0452  Weight: (!) 380 lb 9.6 oz (172.6 kg) (!) 378 lb 6.4 oz (171.6 kg) (!) 380 lb 11.2 oz (172.7 kg)    Telemetry    NSR - Personally Reviewed  ECG    n/a - Personally Reviewed  Physical Exam   GEN: No acute distress.   Neck: JVD difficult to assess secondary to body habitus. Cardiac: RRR, no murmurs, rubs, or gallops.  Respiratory: Diminished breath sounds bilaterally.  GI: Obese, soft, nontender, distended.   MS: 3+ bilateral pitting edema to the thighs; No deformity. Neuro:  Alert and oriented x 3; Nonfocal.  Psych: Normal affect.  Labs    Chemistry Recent Labs  Lab 04/01/17 0553 04/02/17 0447 04/03/17 0708  NA 137 137 138  K 3.9 4.3 4.6  CL 89* 88* 87*  CO2 36* 34* 40*  GLUCOSE 181* 219* 173*  BUN 62* 67* 70*  CREATININE 2.65* 2.78* 3.00*  CALCIUM 8.3* 8.2* 8.5*  GFRNONAA 21* 19* 18*  GFRAA 24* 23* 21*  ANIONGAP 12 15 11      Hematology Recent Labs  Lab 03/28/17 [id number]  WBC 7.5  RBC 3.23*  HGB 8.3*  HCT 27.3*  MCV 84.5  MCH 25.6*  MCHC 30.3*  RDW 16.8*  PLT 269    Cardiac EnzymesNo results for input(s): TROPONINI in the last 168 hours. No results for input(s): TROPIPOC in the last 168 hours.   BNPNo results for input(s): BNP, PROBNP in the last 168 hours.   DDimer No results for input(s): DDIMER in the last 168 hours.   Radiology    US Venous Img Lower Bilateral  Result Date: 04/01/2017 IMPRESSION: No evidence of lower extremity deep vein thrombosis, with limitations as above. Electronically Signed   By: Lucrezia Europe M.D.   On: 04/01/2017 14:51    Cardiac Studies   TTE 10/2016: Study Conclusions  - Left ventricle: The cavity size was mildly dilated. Systolic   function was normal. The estimated ejection fraction was 55%.   Wall motion was normal; there were no regional wall motion   abnormalities.  Doppler parameters are consistent with abnormal   left ventricular relaxation (grade 1 diastolic dysfunction). - Mitral valve: There was mild regurgitation. - Atrial septum: No defect or patent foramen ovale was identified.  Impressions:  - Normal LVEF with mild to moderate MR and mild diastolic   dysfunction.  Patient Profile     47 y.o. female with history of chronic diastolic heart failure, chronic kidney diseasestage III-IV, HTN, DM, iron deficiency anemia,noncompliance,and morbid obesitywith suspected OSA/OHSwho was admitted withacute on chronic diastolic CHF.  Assessment & Plan    1. Acute on chronic diastolic CHF: -She continues to appear grossly volume overloaded -Her labs appear to indicate she is intravascularly volume depleted, though she remains significantly volume overloaded -Agree with IV albumin as planned per nephrology  -Will continue IV Lasix gtt for now while we get IV albumin started in an effort to improve her intravascular volume status followed by continued IV diuresis  -However, this is an overall difficult situation given her underlying CKD with AKI that has limited her diuresis at times. She may ultimately require temporary dialysis per nephrology -Continue metolazone with KCl repletion  -Daily weights with strict Is and Os   2. Acute on CKD stage III-IV: -Nephrology on board -May require temporary dialysis -Monitor closely  3. HTN: -Well controlled continue current medications  4. Morbid obeisty: -Weight loss advised -Likely playing a significant role in the above  For questions or updates, please contact [place] Please consult [email or web address] for contact info under Cardiology/STEMI.    Signed, Christell Faith, PA-C [place] Pager: [phone] 04/03/2017, 9:44 AM

## 2017-04-03 NOTE — Progress Notes (Signed)
Physical Therapy Treatment Patient Details Name: [name] MRN: [id number] DOB: [birth year] Today's Date: 04/03/2017    History of Present Illness Pt is a45 y.o.femalewith a known history of systolic CHF, hypertension, diabetes, morbid obesity who was recently discharged from the hospital returns from CHF clinic due to weight gain, worsening shortness of breath. Attempts were made to start IV Lasix through home health but were unsuccessful. Also due to her severe shortness of breath was sent to the emergency room. Here chest x-ray shows pulmonary edema and right effusion which is chronic. Patient has been compliant with medications, fluid restriction and salt restriction.  Assessment includes: Acute on chronic diastolic congestive heart failure, acute on chronic respiratory failure with tachypnea due to CHF, HTN, DM, elevated troponin likely due to CHF, and CKD stage IV.    PT Comments    Participated in exercises as described below.  Pt agrees to sit at edge of bed this am.  Max a x 2 for mobility.  She was able to remains sitting with one UE support x 12 minutes today.  Requires UE assist to remain upright.  Encouraged standing but she refused at this time stating she did not feel like she could do it.    Generally more effort into session today but remains significantly limited mobility.   Follow Up Recommendations  SNF     Equipment Recommendations  Other (comment)  Hoyer lift, hospital bed if returning home vs SNF   Recommendations for Other Services       Precautions / Restrictions Precautions Precautions: Fall Restrictions Weight Bearing Restrictions: No    Mobility  Bed Mobility Overal bed mobility: Needs Assistance Bed Mobility: Supine to Sit;Sit to Supine;Rolling Rolling: Max assist   Supine to sit: +2 for physical assistance;Mod assist;Max assist Sit to supine: Mod assist;Max assist;+2 for physical assistance      Transfers                     Ambulation/Gait             General Gait Details: stated she is unable to stand today   Stairs            Wheelchair Mobility    Modified Rankin (Stroke Patients Only)       Balance Overall balance assessment: Needs assistance Sitting-balance support: Feet supported;Single extremity supported Sitting balance-Leahy Scale: Fair Sitting balance - Comments: Pt shifted to R in sitting secondary to body habitus       Standing balance comment: declined standing.                            Cognition Arousal/Alertness: Lethargic Behavior During Therapy: WFL for tasks assessed/performed Overall Cognitive Status: Within Functional Limits for tasks assessed                                        Exercises Other Exercises Other Exercises: supine exercises  2 x 10 for ankle pumps, heel slides, ab/add and knee flexion ext.      General Comments        Pertinent Vitals/Pain Pain Assessment: No/denies pain    Home Living                      Prior Function  PT Goals (current goals can now be found in the care plan section) Progress towards PT goals: Progressing toward goals    Frequency    Min 2X/week      PT Plan Current plan remains appropriate    Co-evaluation              AM-PAC PT "6 Clicks" Daily Activity  Outcome Measure  Difficulty turning over in bed (including adjusting bedclothes, sheets and blankets)?: Unable Difficulty moving from lying on back to sitting on the side of the bed? : Unable Difficulty sitting down on and standing up from a chair with arms (e.g., wheelchair, bedside commode, etc,.)?: Unable Help needed moving to and from a bed to chair (including a wheelchair)?: Total Help needed walking in hospital room?: Total Help needed climbing 3-5 steps with a railing? : Total 6 Click Score: 6    End of Session Equipment Utilized During Treatment: Oxygen Activity Tolerance:  Patient limited by fatigue Patient left: in bed;with bed alarm set;with call bell/phone within reach;Other (comment) Nurse Communication: Other (comment)       Time: 6294-7654 PT Time Calculation (min) (ACUTE ONLY): 33 min  Charges:  $Therapeutic Exercise: 8-22 mins $Therapeutic Activity: 8-22 mins                    G Codes:       Chesley Noon, PTA 04/03/17, 11:45 AM

## 2017-04-03 NOTE — Progress Notes (Signed)
Initial Nutrition Assessment  DOCUMENTATION CODES:   Morbid obesity  INTERVENTION:   Nepro Shake po TID, each supplement provides 425 kcal and 19 grams protein  MVI daily  Liberalize diet  NUTRITION DIAGNOSIS:   Increased nutrient needs related to chronic illness(CHF, morbid obesity) as evidenced by increased estimated needs from protein.  GOAL:   Patient will meet greater than or equal to 90% of their needs  MONITOR:   PO intake, Supplement acceptance, Weight trends, TF tolerance, Skin, I & O's, Labs  REASON FOR ASSESSMENT:   LOS    ASSESSMENT:   47 y.o. female with a known history of systolic CHF, CKD IV, hypertension, diabetes, morbid obesity who was recently discharged from the hospital returns from CHF clinic due to weight gain, worsening shortness of breath.   Met with pt in room today. Pt reports poor appetite and oral intake for several months pta. Pt reports her appetite is improved today but it is still not back to normal; pt ate 90% of her breakfast today which included an english muffin, scrambled eggs, and a hamburger patty. Pt with increased estimated needs r/t morbid obesity and heart disease. RD will add Nepro supplements to help pt meet her needs as these are high in calories and protein and are carbohydrate controlled. If pt does not like Nepro, she can be provided with Premier Protein. RD will also add MVI and liberalize diet as a heart healthy diet restricts protein also. Per chart, pt with significant wt gain pta r/t anasarca; pt has lost 18lbs since admit. Pt would benefit from outpatient nutrition education.  Medications reviewed and include: aspirin, celexa, heparin, insulin, KCl  Labs reviewed: K 4.6 wnl, Cl 87(L), BUN 70(H), creat 3.0(H), Ca 8.5(L) BNP 577(H)- 1/31 Hgb 8.3(L), Hct 27.3(L) cbgs- 181, 219, 173 x 48 hrs  Diet heart healthy/carb modified Room service appropriate? Yes; Fluid consistency: Thin; Fluid restriction: 1200 mL  Fluid  EDUCATION NEEDS:   Education needs have been addressed  Skin:  Reviewed RN Assessment  Last BM:  12/6- type 7  Height:   Ht Readings from Last 1 Encounters:  03/24/17 _0  (1.626 m)    Weight:   Wt Readings from Last 1 Encounters:  04/03/17 (!) 380 lb 11.2 oz (172.7 kg)    Ideal Body Weight:  54.5 kg  BMI:  Body mass index is 65.35 kg/m.  Estimated Nutritional Needs:   Kcal:  2400-2700kcal/day   Protein:  >150g/day   Fluid:  per MD  Koleen Distance MS, RD, LDN Pager #[phone] After Hours Pager: [phone]

## 2017-04-04 ENCOUNTER — Encounter
Admission: EM | Disposition: A | Discharge status: Skilled Nursing Facility | Payer: Self-pay | Source: Home / Self Care | Attending: Internal Medicine

## 2017-04-04 ENCOUNTER — Inpatient Hospital Stay: Payer: Medicaid Other

## 2017-04-04 DIAGNOSIS — N183 Chronic kidney disease, stage 3 (moderate): Secondary | ICD-10-CM

## 2017-04-04 DIAGNOSIS — I10 Essential (primary) hypertension: Secondary | ICD-10-CM

## 2017-04-04 DIAGNOSIS — I509 Heart failure, unspecified: Secondary | ICD-10-CM

## 2017-04-04 DIAGNOSIS — R0602 Shortness of breath: Secondary | ICD-10-CM

## 2017-04-04 HISTORY — PX: DIALYSIS/PERMA CATHETER INSERTION: CATH118288

## 2017-04-04 LAB — BASIC METABOLIC PANEL
Anion gap: 9 (ref 5–15)
BUN: 73 mg/dL — AB (ref 6–20)
CHLORIDE: 87 mmol/L — AB (ref 101–111)
CO2: 39 mmol/L — ABNORMAL HIGH (ref 22–32)
Calcium: 8.2 mg/dL — ABNORMAL LOW (ref 8.9–10.3)
Creatinine, Ser: 3.02 mg/dL — ABNORMAL HIGH (ref 0.44–1.00)
GFR calc Af Amer: 20 mL/min — ABNORMAL LOW (ref >60)
GFR calc non Af Amer: 18 mL/min — ABNORMAL LOW (ref >60)
Glucose, Bld: 210 mg/dL — ABNORMAL HIGH (ref 65–99)
POTASSIUM: 4.8 mmol/L (ref 3.5–5.1)
Sodium: 135 mmol/L (ref 135–145)

## 2017-04-04 LAB — GLUCOSE, CAPILLARY
GLUCOSE-CAPILLARY: 186 mg/dL — AB (ref 65–99)
Glucose-Capillary: 211 mg/dL — ABNORMAL HIGH (ref 65–99)
Glucose-Capillary: 181 mg/dL — ABNORMAL HIGH (ref 65–99)

## 2017-04-04 IMAGING — DX DG CHEST 1V PORT
1 series · 1 of 1 positions shown · non-contrast
Comparison: [DATE].

CLINICAL DATA: Status post central line placement.

EXAM:
PORTABLE CHEST 1 VIEW

[chest ap]
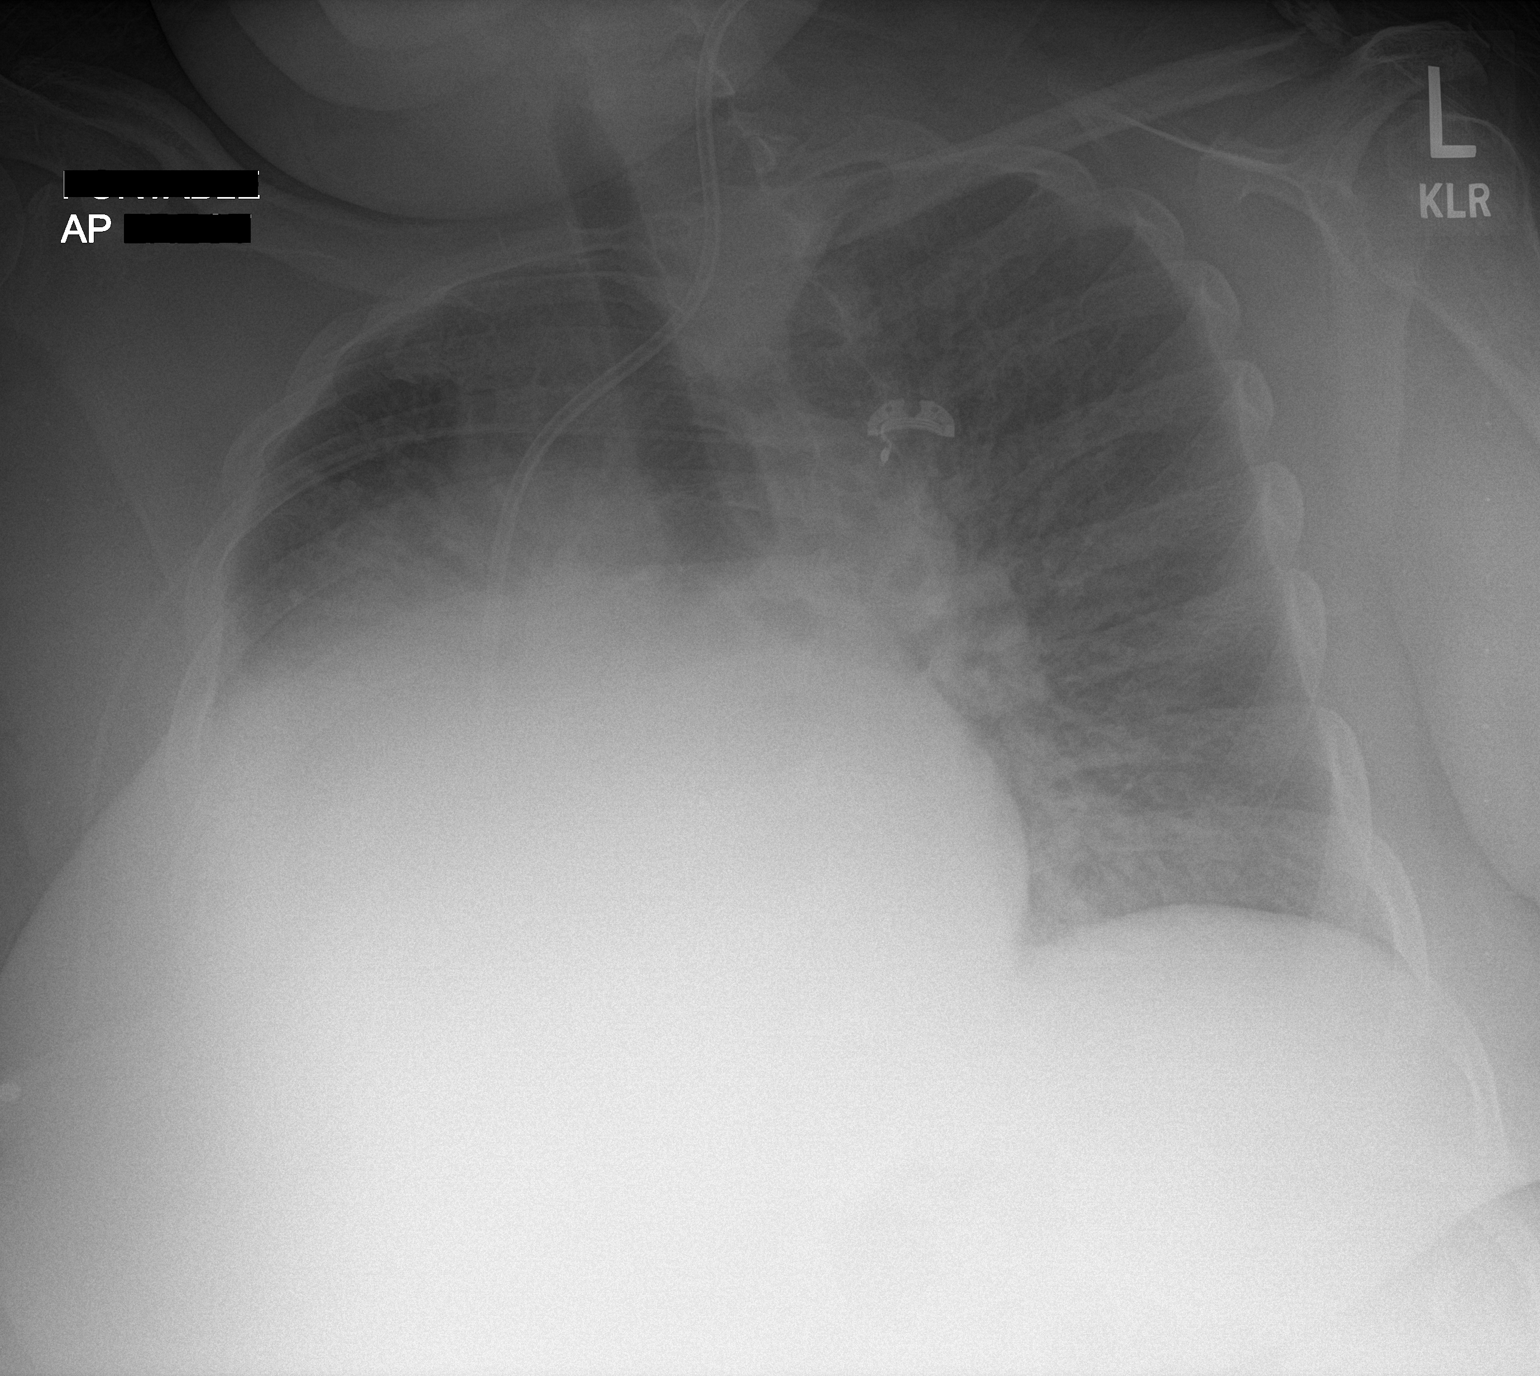

[1 of 1 positions shown; findings below may reference images not displayed]

FINDINGS: Interval left jugular catheter extending into the superior vena
cava. The tip is not visualized due to under exposure of the
overlying heart and soft tissues as a result of the large size of
the patient and patient rotation to the right. The pulmonary
vasculature and interstitial markings are mildly prominent. No gross
change in enlargement of the cardiac silhouette. No pneumothorax is
seen. The visualized bones are unremarkable.
IMPRESSION: 1. Limited examination demonstrating an interval left jugular
catheter with its tip not visualized, possibly at the junction of
the superior vena cava and right atrium.
2. No pneumothorax.
3. Grossly stable cardiomegaly with mildly progressive pulmonary
vascular congestion and possible mild interstitial pulmonary edema.

## 2017-04-04 SURGERY — DIALYSIS/PERMA CATHETER INSERTION
Laterality: Right

## 2017-04-04 SURGERY — Surgical Case
Anesthesia: *Unknown

## 2017-04-04 MED ORDER — LIDOCAINE-EPINEPHRINE (PF) 1 %-1:200000 IJ SOLN
INTRAMUSCULAR | Status: DC | PRN
  Administered 2017-04-04: 20 mL

## 2017-04-04 MED ORDER — IPRATROPIUM-ALBUTEROL 0.5-2.5 (3) MG/3ML IN SOLN
3.0000 mL | Freq: Once | RESPIRATORY_TRACT | Status: AC
  Administered 2017-04-04: 3 mL via RESPIRATORY_TRACT
  Filled 2017-04-04: qty 3

## 2017-04-04 MED ORDER — IPRATROPIUM-ALBUTEROL 0.5-2.5 (3) MG/3ML IN SOLN
RESPIRATORY_TRACT | Status: AC
  Filled 2017-04-04: qty 3

## 2017-04-04 MED ORDER — METOPROLOL TARTRATE 50 MG PO TABS
50.0000 mg | ORAL_TABLET | Freq: Two times a day (BID) | ORAL | Status: DC
  Administered 2017-04-05 – 2017-04-17 (×11): 50 mg via ORAL
  Filled 2017-04-04 (×21): qty 1

## 2017-04-04 MED ORDER — FLEET ENEMA 7-19 GM/118ML RE ENEM
1.0000 | ENEMA | Freq: Once | RECTAL | Status: AC
  Administered 2017-04-05: 1 via RECTAL

## 2017-04-04 SURGICAL SUPPLY — 2 items
COVER PROBE U/S 5X48 (MISCELLANEOUS) ×2 IMPLANT
KIT CATH DIALYSIS TRI 24X13 (CATHETERS) ×2 IMPLANT

## 2017-04-04 NOTE — Progress Notes (Signed)
Progress Note  Patient Name: [name] Date of Encounter: 04/04/2017  Primary Cardiologist: Rockey Situ  Subjective   SOB about the same. Documented UOP of 950 mL for the past 24 hours. SCr remains elevated above her baseline, though stable at 3.02. Seen by nephrology, planning to initiate ultrafiltration. Possible temporary dialysis catheter today followed by ultrafiltration.   No chest pain. Notes myoclonic jerking.   Inpatient Medications    Scheduled Meds: . aspirin EC  81 mg Oral Daily  . atorvastatin  80 mg Oral q1800  . citalopram  40 mg Oral Daily  . feeding supplement (NEPRO CARB STEADY)  237 mL Oral TID WC  . heparin  5,000 Units Subcutaneous Q8H  . hydrALAZINE  100 mg Oral Q8H  . hydrocerin   Topical BID  . insulin aspart  0-15 Units Subcutaneous TID WC  . insulin aspart  0-5 Units Subcutaneous QHS  . insulin glargine  12 Units Subcutaneous QHS  . ipratropium-albuterol  3 mL Nebulization Q4H  . metolazone  10 mg Oral Daily  . metoprolol tartrate  50 mg Oral BID  . multivitamin with minerals  1 tablet Oral Daily  . potassium chloride  20 mEq Oral BID  . sodium chloride flush  3 mL Intravenous Q12H   Continuous Infusions: . sodium chloride    . albumin human    . furosemide (LASIX) infusion 8 mg/hr (04/04/17 0353)   PRN Meds: sodium chloride, acetaminophen **OR** acetaminophen, cyclobenzaprine, diphenhydrAMINE, ondansetron **OR** ondansetron (ZOFRAN) IV, polyethylene glycol, sodium chloride, sodium chloride flush   Vital Signs    Vitals:   04/04/17 0436 04/04/17 0512 04/04/17 0827 04/04/17 0834  BP:  126/67  (!) 149/87  Pulse: 79 81  81  Resp: 16     Temp:    (!) 97.4 F (36.3 C)  TempSrc:    Oral  SpO2: 96% 96% (!) 85% 96%  Weight:      Height:        Intake/Output Summary (Last 24 hours) at 04/04/2017 1141 Last data filed at 04/04/2017 1105 Gross per 24 hour  Intake 222 ml  Output 1450 ml  Net -1228 ml   Filed Weights   04/02/17 0416  04/03/17 0452 04/04/17 0354  Weight: (!) 378 lb 6.4 oz (171.6 kg) (!) 380 lb 11.2 oz (172.7 kg) (!) 382 lb 9.6 oz (173.5 kg)    Telemetry    NSR - Personally Reviewed  ECG    n/a - Personally Reviewed  Physical Exam   GEN: No acute distress.   Neck: JVD difficult to assess secondary to body habitus. Cardiac: RRR, no murmurs, rubs, or gallops.  Respiratory: Clear to auscultation bilaterally.  GI: Soft, nontender, non-distended.   MS: 3+ bilateral pitting edema to the thighs; No deformity. Neuro:  Alert and oriented x 3; Nonfocal.  Psych: Normal affect.  Labs    Chemistry Recent Labs  Lab 04/02/17 0447 04/03/17 0708 04/04/17 0417  NA 137 138 135  K 4.3 4.6 4.8  CL 88* 87* 87*  CO2 34* 40* 39*  GLUCOSE 219* 173* 210*  BUN 67* 70* 73*  CREATININE 2.78* 3.00* 3.02*  CALCIUM 8.2* 8.5* 8.2*  GFRNONAA 19* 18* 18*  GFRAA 23* 21* 20*  ANIONGAP 15 11 9      HematologyNo results for input(s): WBC, RBC, HGB, HCT, MCV, MCH, MCHC, RDW, PLT in the last 168 hours.  Cardiac EnzymesNo results for input(s): TROPONINI in the last 168 hours. No results for input(s): TROPIPOC  in the last 168 hours.   BNPNo results for input(s): BNP, PROBNP in the last 168 hours.   DDimer No results for input(s): DDIMER in the last 168 hours.   Radiology    No results found.  Cardiac Studies   TTE 10/2016: Study Conclusions  - Left ventricle: The cavity size was mildly dilated. Systolic function was normal. The estimated ejection fraction was 55%. Wall motion was normal; there were no regional wall motion abnormalities. Doppler parameters are consistent with abnormal left ventricular relaxation (grade 1 diastolic dysfunction). - Mitral valve: There was mild regurgitation. - Atrial septum: No defect or patent foramen ovale was identified.  Impressions:  - Normal LVEF with mild to moderate MR and mild diastolic dysfunction.   Patient Profile     47 y.o. female with  history of chronic diastolic heart failure, chronic kidney diseasestage III-IV, HTN, DM, iron deficiency anemia,noncompliance,and morbid obesitywith suspected OSA/OHSwho was admitted withacute on chronic diastolic CHF.  Assessment & Plan    1. Acute on chronic diastolic CHF: -She continues to appear grossly volume overloaded -Her labs appear to indicate she is intravascularly volume depleted, though she remains significantly volume overloaded -Continue IV albumin as planned per nephrology  -Remains on Lasix gtt -She is for temporary dialysis cathter today followed by ultrafiltration for volume management -Continue metolazone with KCl repletion  -Daily weights with strict Is and Os   2. Acute on CKD stage III-IV: -Nephrology on board -Ultrafiltration after temporary dialysis catheter as above -Monitor closely  3. HTN: -Well controlled continue current medications  4. Morbid obeisty: -Weight loss advised -Likely playing a significant role in the above    For questions or updates, please contact [place] Please consult [email or web address] for contact info under Cardiology/STEMI.    Signed, Christell Faith, PA-C [place] Pager: [phone] 04/04/2017, 11:41 AM

## 2017-04-04 NOTE — Progress Notes (Signed)
Post HD assessment. Pt stable, no c/o, goal met. Pt still SOB and tachypeanic. Pt tolerated her first tx well.

## 2017-04-04 NOTE — Consult Note (Signed)
[facility] Vascular Consult Note  MRN : [id number]  [name] is a 47 y.o. (01-May-1970) female who presents with chief complaint of  Chief Complaint  Patient presents with  . Shortness of Breath  . Congestive Heart Failure   History of Present Illness:  The patient is a 47 year old female with a past medical history of chronic diastolic heart failure, hypertension, morbid obesity, iron deficiency anemia, poorly controlled type 2 diabetes mellitus, chronic kidney disease stage IV who originally presented to [facility] emergency department for shortness of breath on March 03, 2017.  The patient has been treated by the heart failure clinic in the past.  When she was too weak to travel to her heart failure clinic EMS was called and she was evaluated by the ED found to be fluid overloaded and admitted as an inpatient.  The patient has received IV albumin and Lasix drip with minimal improvement to her volume status.  The patient continues to complain of shortness of breath and weakness despite being hospitalized for almost 1 month.  The patient patient denies any chest pain.  The patient denies any fever, nausea or vomiting.  The vascular surgery service was consulted by Dr. Candiss Norse for a temporary dialysis catheter placement so the patient may receive ultra filtration.  Current Facility-Administered Medications  Medication Dose Route Frequency Provider Last Rate Last Dose  . 0.9 %  sodium chloride infusion  250 mL Intravenous PRN Sudini, Alveta Heimlich, MD      . acetaminophen (TYLENOL) tablet 650 mg  650 mg Oral Q6H PRN Hillary Bow, MD   650 mg at 04/03/17 2241   Or  . acetaminophen (TYLENOL) suppository 650 mg  650 mg Rectal Q6H PRN Sudini, Alveta Heimlich, MD      . albumin human 25 % solution 12.5 g  12.5 g Intravenous Q6H Candiss Norse, Harmeet, MD   12.5 g at 04/04/17 0907  . aspirin EC tablet 81 mg  81 mg Oral Daily Hillary Bow, MD   81 mg at 04/04/17 0905   . atorvastatin (LIPITOR) tablet 80 mg  80 mg Oral q1800 Hillary Bow, MD   80 mg at 04/03/17 2238  . citalopram (CELEXA) tablet 40 mg  40 mg Oral Daily Sudini, Srikar, MD      . cyclobenzaprine (FLEXERIL) tablet 7.5 mg  7.5 mg Oral TID PRN Lance Coon, MD   7.5 mg at 03/29/17 0353  . diphenhydrAMINE (BENADRYL) capsule 25 mg  25 mg Oral Q8H PRN Hillary Bow, MD   25 mg at 03/31/17 1831  . feeding supplement (NEPRO CARB STEADY) liquid 237 mL  237 mL Oral TID WC Mody, Sital, MD   237 mL at 04/04/17 0907  . furosemide (LASIX) 250 mg in dextrose 5 % 250 mL (1 mg/mL) infusion  8 mg/hr Intravenous Continuous Murlean Iba, MD 8 mL/hr at 04/04/17 0353 8 mg/hr at 04/04/17 0353  . heparin injection 5,000 Units  5,000 Units Subcutaneous Q8H Hillary Bow, MD   5,000 Units at 04/04/17 0548  . hydrALAZINE (APRESOLINE) tablet 100 mg  100 mg Oral Q8H Sudini, Srikar, MD   100 mg at 04/03/17 6837  . hydrocerin (EUCERIN) cream   Topical BID Dellinger, Haynes Dage L, PA-C      . insulin aspart (novoLOG) injection 0-15 Units  0-15 Units Subcutaneous TID WC Hillary Bow, MD   3 Units at 04/04/17 1240  . insulin aspart (novoLOG) injection 0-5 Units  0-5 Units Subcutaneous QHS Hillary Bow, MD  2 Units at 04/03/17 2237  . insulin glargine (LANTUS) injection 12 Units  12 Units Subcutaneous QHS Hillary Bow, MD   12 Units at 04/03/17 2235  . ipratropium-albuterol (DUONEB) 0.5-2.5 (3) MG/3ML nebulizer solution 3 mL  3 mL Nebulization Q4H Salary, Montell D, MD   3 mL at 04/04/17 1155  . metolazone (ZAROXOLYN) tablet 10 mg  10 mg Oral Daily Salary, Montell D, MD   10 mg at 04/04/17 0904  . metoprolol tartrate (LOPRESSOR) tablet 50 mg  50 mg Oral BID Murlean Iba, MD      . multivitamin with minerals tablet 1 tablet  1 tablet Oral Daily Bettey Costa, MD   1 tablet at 04/04/17 0905  . ondansetron (ZOFRAN) tablet 4 mg  4 mg Oral Q6H PRN Hillary Bow, MD       Or  . ondansetron (ZOFRAN) injection 4 mg  4 mg  Intravenous Q6H PRN Sudini, Srikar, MD      . polyethylene glycol (MIRALAX / GLYCOLAX) packet 17 g  17 g Oral Daily PRN Sudini, Srikar, MD      . potassium chloride SA (K-DUR,KLOR-CON) CR tablet 20 mEq  20 mEq Oral BID Loney Hering D, MD   20 mEq at 04/04/17 0905  . sodium chloride (OCEAN) 0.65 % nasal spray 1 spray  1 spray Each Nare PRN Demetrios Loll, MD   1 spray at 03/25/17 2206  . sodium chloride flush (NS) 0.9 % injection 3 mL  3 mL Intravenous Q12H Hillary Bow, MD   3 mL at 04/04/17 0908  . sodium chloride flush (NS) 0.9 % injection 3 mL  3 mL Intravenous PRN Hillary Bow, MD       Past Medical History:  Diagnosis Date  . (HFpEF) heart failure with preserved ejection fraction ([place])    a. 11/2014 Echo: EF 50-55%, Gr1 DD; b. 06/2016 Echo: EF 60-65%, no rwma, mod dil LA, nl RV, triv eff.  . Asthma   . CHF (congestive heart failure) ([place])   . CKD (chronic kidney disease), stage III ([place])   . COPD (chronic obstructive pulmonary disease) ([place])    a. Home O2.  . Diabetes mellitus without complication ([place])   . Hypertension   . Iron deficiency anemia    a. chronic blood loss->heavy menses.  . Morbid obesity ([place])   . Pericardial effusion    a. 11/2014 CT Chest: Mod effusion; b. 11/2014 Echo: small to mod circumferential effusion. No hemodynamic compromise.  [name] Poorly controlled diabetes mellitus ([place])    a. 11/2014 A1c 13.2.   Past Surgical History:  Procedure Laterality Date  . CESAREAN SECTION     times 3         Social History Social History   Tobacco Use  . Smoking status: Never Smoker  . Smokeless tobacco: Never Used  Substance Use Topics  . Alcohol use: Yes    Alcohol/week: 0.0 oz    Comment: occ drink - <1/wk  . Drug use: No   Family History Family History  Problem Relation Age of Onset  . Diabetes Mother   . Hypertension Mother   . Hypertension Father   . Diabetes Father   . Hypertension Unknown   . Diabetes Unknown   . Breast cancer Maternal Aunt 25  .  Breast cancer Maternal Aunt 7  Patient denies any family history of peripheral artery disease, venous disease or renal disease  Allergies  Allergen Reactions  . Dust Mite Extract Shortness Of Breath and Itching  .  Mold Extract [Trichophyton] Shortness Of Breath and Itching  . Feraheme [Ferumoxytol] Itching  . Pollen Extract   . Sulfa Antibiotics Itching   REVIEW OF SYSTEMS (Negative unless checked)  Constitutional: [] Weight loss  [] Fever  [] Chills Cardiac: [] Chest pain   [] Chest pressure   [] Palpitations   [x] Shortness of breath when laying flat   [] Shortness of breath at rest   [x] Shortness of breath with exertion. Vascular:  [] Pain in legs with walking   [] Pain in legs at rest   [] Pain in legs when laying flat   [] Claudication   [] Pain in feet when walking  [] Pain in feet at rest  [] Pain in feet when laying flat   [] History of DVT   [] Phlebitis   [x] Swelling in legs   [] Varicose veins   [] Non-healing ulcers Pulmonary:   [] Uses home oxygen   [] Productive cough   [] Hemoptysis   [x] Wheeze  [] COPD   [] Asthma Neurologic:  [] Dizziness  [] Blackouts   [] Seizures   [] History of stroke   [] History of TIA  [] Aphasia   [] Temporary blindness   [] Dysphagia   [] Weakness or numbness in arms   [] Weakness or numbness in legs Musculoskeletal:  [] Arthritis   [] Joint swelling   [] Joint pain   [] Low back pain Hematologic:  [] Easy bruising  [] Easy bleeding   [] Hypercoagulable state   [] Anemic  [] Hepatitis Gastrointestinal:  [] Blood in stool   [] Vomiting blood  [] Gastroesophageal reflux/heartburn   [] Difficulty swallowing. Genitourinary:  [x] Chronic kidney disease   [] Difficult urination  [] Frequent urination  [] Burning with urination   [] Blood in urine Skin:  [] Rashes   [] Ulcers   [] Wounds Psychological:  [] History of anxiety   []  History of major depression.  Physical Examination  Vitals:   04/04/17 0436 04/04/17 0512 04/04/17 0827 04/04/17 0834  BP:  126/67  (!) 149/87  Pulse: 79 81  81  Resp: 16      Temp:    (!) 97.4 F (36.3 C)  TempSrc:    Oral  SpO2: 96% 96% (!) 85% 96%  Weight:      Height:       Body mass index is 65.67 kg/m. Gen:  WD/WN, NAD Head: [place]/AT, No temporalis wasting. Prominent temp pulse not noted. Ear/Nose/Throat: Hearing grossly intact, nares w/o erythema or drainage, oropharynx w/o Erythema/Exudate Eyes: Sclera non-icteric, conjunctiva clear Neck: Trachea midline.  No JVD.  Pulmonary:  Good air movement, respirations not labored, equal bilaterally.  Cardiac: RRR, normal S1, S2. Vascular:  Vessel Right Left  Radial Palpable Palpable  Ulnar Palpable Palpable  Brachial Palpable Palpable  Carotid Palpable, without bruit Palpable, without bruit  Aorta Not palpable N/A  Femoral Palpable Palpable  Popliteal Palpable Palpable  PT Non-Palpable Non-Palpable  DP Non-Palpable Non-Palpable   Gastrointestinal: soft, non-tender/non-distended. No guarding/reflex.  Large pannus. Musculoskeletal: M/S 5/5 throughout.  3+ nonpitting edema bilaterally. Neurologic: Sensation grossly intact in extremities.  Symmetrical.  Speech is fluent. Motor exam as listed above. Psychiatric: Judgment intact, Mood & affect appropriate for pt's clinical situation. Dermatologic: No rashes or ulcers noted.  No cellulitis or open wounds.  Moderate stasis dermatitis noted to the bilateral calves.  Skin thickening is also noted. Lymph : No Cervical, Axillary, or Inguinal lymphadenopathy.  CBC Lab Results  Component Value Date   WBC 7.5 03/28/2017   HGB 8.3 (L) 03/28/2017   HCT 27.3 (L) 03/28/2017   MCV 84.5 03/28/2017   PLT 269 03/28/2017   BMET    Component Value Date/Time   NA 135 04/04/2017 0417  NA 135 (L) 11/21/2013 1153   K 4.8 04/04/2017 0417   K 4.1 11/21/2013 1153   CL 87 (L) 04/04/2017 0417   CL 98 11/21/2013 1153   CO2 39 (H) 04/04/2017 0417   CO2 30 11/21/2013 1153   GLUCOSE 210 (H) 04/04/2017 0417   GLUCOSE 287 (H) 11/21/2013 1153   BUN 73 (H) 04/04/2017 0417    BUN 16 11/21/2013 1153   CREATININE 3.02 (H) 04/04/2017 0417   CREATININE 0.85 11/21/2013 1153   CALCIUM 8.2 (L) 04/04/2017 0417   CALCIUM 8.7 11/21/2013 1153   GFRNONAA 18 (L) 04/04/2017 0417   GFRNONAA >60 11/21/2013 1153   GFRAA 20 (L) 04/04/2017 0417   GFRAA >60 11/21/2013 1153   Estimated Creatinine Clearance: 38 mL/min (A) (by C-G formula based on SCr of 3.02 mg/dL (H)).  COAG No results found for: INR, PROTIME  Radiology Dg Chest 2 View  Result Date: 03/24/2017 CLINICAL DATA:  Shortness of Breath EXAM: CHEST  2 VIEW COMPARISON:  03/08/2017 FINDINGS: Marked cardiomegaly. Large right pleural effusion with right lower lobe atelectasis, stable. No confluent opacity or effusion on the left. No acute bony abnormality. IMPRESSION: Marked cardiomegaly. Large right pleural effusion with right lower lobe atelectasis, stable. Electronically Signed   By: Rolm Baptise M.D.   On: 03/24/2017 18:03   Dg Chest 2 View  Result Date: 03/08/2017 CLINICAL DATA:  Cough, shortness of Breath EXAM: CHEST  2 VIEW COMPARISON:  03/03/2017 FINDINGS: Cardiomegaly. Right pleural effusion and right lower lung airspace disease again noted, similar prior study. Left lung is clear. IMPRESSION: Cardiomegaly. Continued right effusion and right lower lung airspace disease. Electronically Signed   By: Rolm Baptise M.D.   On: 03/08/2017 09:16   US Venous Img Lower Bilateral  Result Date: 04/01/2017 CLINICAL DATA:  Swelling x1 month EXAM: BILATERAL LOWER EXTREMITY VENOUS DOPPLER ULTRASOUND TECHNIQUE: Gray-scale sonography with compression, as well as color and duplex ultrasound, were performed to evaluate the deep venous system from the level of the common femoral vein through the popliteal and proximal calf veins. Technologist describes technically difficult examination secondary to diffuse anasarca involving abdomen and lower extremities. COMPARISON:  11/21/2013 FINDINGS: Normal compressibility of the common femoral,  superficial femoral, and popliteal veins. Limited evaluation of the proximal calf veins secondary to anasarca. No filling defects to suggest DVT on grayscale or color Doppler imaging. Doppler waveforms show normal direction of venous flow, normal respiratory phasicity. Limited response to augmentation in bilateral segments of femoral and popliteal veins may be related to marked anasarca. IMPRESSION: No evidence of lower extremity deep vein thrombosis, with limitations as above. Electronically Signed   By: Lucrezia Europe M.D.   On: 04/01/2017 14:51   Assessment/Plan The patient is a 47 year old female with a past medical history of chronic diastolic heart failure, hypertension, morbid obesity, iron deficiency anemia, poorly controlled type 2 diabetes mellitus, chronic kidney disease stage IV who originally presented to [facility] emergency department for shortness of breath on March 03, 2017 - minimal improvement in symptoms 1.  Fluid overload: Patient continues to experience shortness of breath with minimal improvement to fluid status after almost a month of medical management.  We will place a temporary dialysis catheter to the neck as the patient has a large pannus in her bilateral lower extremity extremely edematous.  Once the temporary dialysis catheter is placed the patient can then proceed with ultrafiltration.  Procedure, risks and benefits explained to the patient.  All questions answered.  Patient  wishes to proceed. 2.  Chronic kidney disease: This is a contributing factor to the patient's fluid status.  This is being followed by nephrology.  If the patient will need a more permanent dialysis access we will plan on a Permacath possibly next week. 3.  Type 2 diabetes: Encouraged good control as its slows the progression of atherosclerotic disease 4.  Hypertension: Encouraged good control as its slows the progression of atherosclerotic disease  Discussed with Dr.  Francene Castle, PA-C  04/04/2017 1:29 PM  This note was created with Dragon medical transcription system.  Any error is purely unintentional.

## 2017-04-04 NOTE — Progress Notes (Signed)
Post HD assessment  

## 2017-04-04 NOTE — Op Note (Signed)
  OPERATIVE NOTE   PROCEDURE: 1. Insertion of temporary dialysis catheter catheter left IJ approach.  PRE-OPERATIVE DIAGNOSIS: Volume overload; acute renal failure  POST-OPERATIVE DIAGNOSIS: Same  SURGEON: Katha Cabal M.D.  ANESTHESIA: 1% lidocaine local infiltration  ESTIMATED BLOOD LOSS: Minimal cc  INDICATIONS:   [name] is a 47 y.o. female who presents with acute volume overload associated with congestive heart failure and renal insufficiency.  DESCRIPTION: After obtaining full informed written consent, the patient was positioned supine. The left neck was prepped and draped in a sterile fashion. Ultrasound was placed in a sterile sleeve. Ultrasound was utilized to identify the left internal jugular vein which is noted to be echolucent and compressible indicating patency. Images recorded for the permanent record. Under real-time visualization a Seldinger needle is inserted into the vein and the guidewires advanced without difficulty. Small counterincision was made at the wire insertion site. Dilator is passed over the wire and the temporary dialysis catheter catheter is fed over the wire without difficulty.  All lumens aspirate and flush easily and are packed with heparin saline. Catheter secured to the skin of the left neck with 2-0 silk. A sterile dressing is applied with Biopatch.  COMPLICATIONS: None  CONDITION: Unchanged  [name] Office:  [phone] 04/04/2017, 5:24 PM

## 2017-04-04 NOTE — Progress Notes (Signed)
Talked to Dr. Bridgett Larsson about patient's complaints of constipation, order for fleet enema once. No other concern at the moment. RN will continue to monitor.

## 2017-04-04 NOTE — Progress Notes (Signed)
Pre HD assessment  

## 2017-04-04 NOTE — Progress Notes (Addendum)
Inpatient Diabetes Program Recommendations  AACE/ADA: New Consensus Statement on Inpatient Glycemic Control (2015)  Target Ranges:  Prepandial:   less than 140 mg/dL      Peak postprandial:   less than 180 mg/dL (1-2 hours)      Critically ill patients:  140 - 180 mg/dL   Lab Results  Component Value Date   GLUCAP 211 (H) 04/04/2017   HGBA1C 7.3 (H) 03/05/2017    Review of Glycemic Control  Results for [name], [name] (MRN [id number]) as of 04/04/2017 11:00  Ref. Range 04/03/2017 08:08 04/03/2017 12:05 04/03/2017 16:32 04/03/2017 21:31 04/04/2017 08:37  Glucose-Capillary Latest Ref Range: 65 - 99 mg/dL 229 (H) 223 (H) 179 (H) 229 (H) 211 (H)    Diabetes history: Type 2 Outpatient Diabetes medications: Novolog 0-12 units tid (from medication record), Lantus 30 units qhs (as reported by patient)  Current orders for Inpatient glycemic control: Lantus 12 units qhs, Novolog 0-15 units tid, Novolog 0-5 units qhs  Inpatient Diabetes Program Recommendations:Consider increasing  Lantus to 17 units qhs (0.1mg /dl) qhs.    Gentry Fitz, RN, BA, MHA, CDE Diabetes Coordinator Inpatient Diabetes Program  [phone] (Team Pager) [phone] ([place]) 04/04/2017 11:06 AM

## 2017-04-04 NOTE — Progress Notes (Signed)
HD tx end  

## 2017-04-04 NOTE — Progress Notes (Signed)
HD tx start 

## 2017-04-04 NOTE — Care Management (Signed)
Patient is going to receive hemodialysis. Vascular to place temporary catheter. Updated [facility]

## 2017-04-04 NOTE — Progress Notes (Signed)
[facility], [place] 04/04/17  Subjective:   Patient is continued on IV furosemide infusion.  Urine output recorded at 950 cc yesterday Serum creatinine is about the same at 3.02, BUN 73 Patient continues to have large amount of edema in her legs as well as abdominal distention She has some myoclonic movements of her arms Able to eat without nausea or vomiting  Objective:  Vital signs in last 24 hours:  Temp:  [97.4 F (36.3 C)-99.8 F (37.7 C)] 97.4 F (36.3 C) (02/08 0834) Pulse Rate:  [78-84] 81 (02/08 0834) Resp:  [16-18] 16 (02/08 0436) BP: (112-149)/(61-87) 149/87 (02/08 0834) SpO2:  [85 %-100 %] 96 % (02/08 0834) Weight:  [173.5 kg (382 lb 9.6 oz)] 173.5 kg (382 lb 9.6 oz) (02/08 0354)  Weight change: 0.862 kg (1 lb 14.4 oz) Filed Weights   04/02/17 0416 04/03/17 0452 04/04/17 0354  Weight: (!) 171.6 kg (378 lb 6.4 oz) (!) 172.7 kg (380 lb 11.2 oz) (!) 173.5 kg (382 lb 9.6 oz)    Intake/Output:    Intake/Output Summary (Last 24 hours) at 04/04/2017 1052 Last data filed at 04/04/2017 1034 Gross per 24 hour  Intake 254 ml  Output 1350 ml  Net -1096 ml     Physical Exam: General: Patient lying in bed. NAD.  HEENT Normocephalic and atraumatic.  Neck JVD difficult to access d/t body habitus.   Pulm/lungs Lungs with b/l crackles and diminished breath sounds in b/l lower lobes. [place] 02 at 4L/min  CVS/Heart RRR. S4 heart sound.  Abdomen:  Very distended and firm d/t fluid retention  Extremities: 3+ b/l lower extremity pitting edema  Neurologic: Myoclonic jerks noted in b/l upper extremities  Skin: Venous stasis changes noted b/l lower extremities          Basic Metabolic Panel:  Recent Labs  Lab 03/29/17 0641 04/01/17 0553 04/02/17 0447 04/03/17 0708 04/04/17 0417  NA 137 137 137 138 135  K 4.0 3.9 4.3 4.6 4.8  CL 89* 89* 88* 87* 87*  CO2 35* 36* 34* 40* 39*  GLUCOSE 141* 181* 219* 173* 210*  BUN 63* 62* 67* 70* 73*   CREATININE 2.64* 2.65* 2.78* 3.00* 3.02*  CALCIUM 8.4* 8.3* 8.2* 8.5* 8.2*     CBC: No results for input(s): WBC, NEUTROABS, HGB, HCT, MCV, PLT in the last 168 hours.   No results found for: HEPBSAG, HEPBSAB, HEPBIGM    Microbiology:  No results found for this or any previous visit (from the past 240 hour(s)).  Coagulation Studies: No results for input(s): LABPROT, INR in the last 72 hours.  Urinalysis: Recent Labs    04/02/17 1548  COLORURINE YELLOW*  LABSPEC 1.008  PHURINE 5.0  GLUCOSEU NEGATIVE  HGBUR NEGATIVE  BILIRUBINUR NEGATIVE  KETONESUR NEGATIVE  PROTEINUR 100*  NITRITE NEGATIVE  LEUKOCYTESUR NEGATIVE      Imaging: No results found.   Medications:   . sodium chloride    . albumin human    . furosemide (LASIX) infusion 8 mg/hr (04/04/17 0353)   . aspirin EC  81 mg Oral Daily  . atorvastatin  80 mg Oral q1800  . citalopram  40 mg Oral Daily  . feeding supplement (NEPRO CARB STEADY)  237 mL Oral TID WC  . heparin  5,000 Units Subcutaneous Q8H  . hydrALAZINE  100 mg Oral Q8H  . hydrocerin   Topical BID  . insulin aspart  0-15 Units Subcutaneous TID WC  . insulin aspart  0-5 Units Subcutaneous  QHS  . insulin glargine  12 Units Subcutaneous QHS  . ipratropium-albuterol  3 mL Nebulization Q4H  . metolazone  10 mg Oral Daily  . metoprolol tartrate  50 mg Oral BID  . multivitamin with minerals  1 tablet Oral Daily  . potassium chloride  20 mEq Oral BID  . sodium chloride flush  3 mL Intravenous Q12H   sodium chloride, acetaminophen **OR** acetaminophen, cyclobenzaprine, diphenhydrAMINE, ondansetron **OR** ondansetron (ZOFRAN) IV, polyethylene glycol, sodium chloride, sodium chloride flush  Assessment/ Plan:  47 y.o. African American morbidly obese female with h/o diastolic congestive heart failure, chronic kidney disease stage 3-4, HTN, DM, iron deficiency anemia, medication and treatment noncompliance admitted following appointment at the [facility] when she presented with diffuse edema and pain most notable in b/l lower extremities and abdomen.   1. Acute on chronic kidney disease stage 3   - Baseline Cr 1.57 (12/2016)/ GFR 45 -  Admit Cr 2.65--> 2.78 --> 3.0 (2/7). Creatinine at 3.02 today -  Weight 180kg at admission --> 171 kg (2/6) --> 172.7 kg (2/7)- > 173.5 kg today (bed weights) -  Decrease in kidney function following diuresis with torsemide and metolazone. Switched to lasix infusion. -  Increase in Creatinine could be due to progression of underlying CKD vs abnormal cardio-renal hemodynamics leading to intravascular volume depletion. -  Continue lasix, hydralazine, metolazone. - HOLD ARB losartan until Creatinine stabilizes.  Discussed with patient that she has suboptimal response to IV Lasix infusion.  We discussed risks and benefits of ultrafiltration.  She has agreed to proceed.  Vascular surgery consult for placement of temporary dialysis catheter and then we will proceed with ultrafiltration session later today or tomorrow.  2. Acute on chronic diastolic congestive heart failure (grade 1 diastolic dysfunction) with [place] - 2018 Cardiac studies: Normal LVEF with mild to moderate MR and grade 1 diastolic dysfunction. Mildly dilated LV with EF ~55% and normal wall motion.  - Cardiology team following - Continue iv albumin for oncotic pressure and iv lasix infusion   3. DM-2 with CKD - A1C 13.2 (11/2014) --> 7.3 (02/2017) - Continue to monitor glucose - management as per Primary team   LOS: 11 Mouhamadou Gittleman 2/8/201910:52 AM  [facility] [place], [place]

## 2017-04-04 NOTE — Progress Notes (Signed)
[facility] [place] NAME: [name]    MR#:  [id number]  DATE OF BIRTH:  Jul 15, 1970  SUBJECTIVE:   Continues to have distended abdomen and lower extremity edema Denies shortness of breath or chest pain REVIEW OF SYSTEMS:    Review of Systems  Constitutional: Negative for fever, chills weight loss HENT: Negative for ear pain, nosebleeds, congestion, facial swelling, rhinorrhea, neck pain, neck stiffness and ear discharge.   Respiratory: Negative for cough, shortness of breath, wheezing  Cardiovascular: Negative for chest pain, palpitations and positive leg swelling.  Gastrointestinal: Patient with distended abdomen and lower extremity edema Genitourinary: Negative for dysuria, urgency, frequency, hematuria Musculoskeletal: Negative for back pain or joint pain Neurological: Negative for dizziness, seizures, syncope, focal weakness,  numbness and headaches.  Hematological: Does not bruise/bleed easily.  Psychiatric/Behavioral: Negative for hallucinations, confusion, dysphoric mood    Tolerating Diet: Yes      DRUG ALLERGIES:   Allergies  Allergen Reactions  . Dust Mite Extract Shortness Of Breath and Itching  . Mold Extract [Trichophyton] Shortness Of Breath and Itching  . Feraheme [Ferumoxytol] Itching  . Pollen Extract   . Sulfa Antibiotics Itching    VITALS:  Blood pressure (!) 149/87, pulse 81, temperature (!) 97.4 F (36.3 C), temperature source Oral, resp. rate 16, height 5\' 4"  (1.626 m), weight (!) 173.5 kg (382 lb 9.6 oz), last menstrual period 09/06/2016, SpO2 96 %.  PHYSICAL EXAMINATION:  Constitutional: Morbidly obese lying in bed not in distress  HENT: Normocephalic. [name] Oropharynx is clear and moist.  Eyes: Conjunctivae and EOM are normal. PERRLA, no scleral icterus.  Neck: Normal ROM. Neck supple. B No tracheal deviation. CVS: RRR, S1/S2 +, no murmurs, s4 gallop, no carotid bruit.  Pulmonary: Effort and breath  sounds normal, no stridor, rhonchi, wheezes, rales.  Abdominal: Grossly distended and very tight Musculoskeletal: Normal range of motion. 4+ lower extremity edema extending through abdomen  Neuro: Alert. CN 2-12 grossly intact. No focal deficits. Skin: Skin is warm and dry. No rash noted. Psychiatric: Normal mood and affect.      LABORATORY PANEL:   CBC No results for input(s): WBC, HGB, HCT, PLT in the last 168 hours. ------------------------------------------------------------------------------------------------------------------  Chemistries  Recent Labs  Lab 04/04/17 0417  NA 135  K 4.8  CL 87*  CO2 39*  GLUCOSE 210*  BUN 73*  CREATININE 3.02*  CALCIUM 8.2*   ------------------------------------------------------------------------------------------------------------------  Cardiac Enzymes No results for input(s): TROPONINI in the last 168 hours. ------------------------------------------------------------------------------------------------------------------  RADIOLOGY:  No results found.   ASSESSMENT AND PLAN:    47 year old female with chronic diastolic heart failure who presented with shortness of breath.  1. Acute on chronic hypoxic respiratory failure due to acute CHF exacerbation She is on her baseline oxygen at 4 L  2. Acute on chronic diastolic heart failure with anasarca: Due to persistent anasarca, plan to initiate temporary dialysis Continue IV Lasix drip with IV albumin for now until she receives dialysis catheter Follow intake and output daily weight Low-sodium diet CHF clinic referral upon discharge Monitor creatinine  3. Diabetes: Continue Lantus with sliding scale and ADA diet  4. Essential hypertension: Continue losartan, hydralazine, clonidine  5. Hyperlipidemia: Continue statin  6. Depression: Continue Celexa  7. Acute on Chronic kidney disease stage III:  Discussed with Dr. Candiss Norse this morning Plan to initiate dialysis   Upon  discharge patient will go home with home health care and palliative care  Management plans discussed with  the patient and she is in agreement.  CODE STATUS: FULL  TOTAL TIME TAKING CARE OF THIS PATIENT: 25 minutes.     POSSIBLE D/C ???, DEPENDING ON CLINICAL CONDITION.   Dennette Faulconer M.D on 04/04/2017 at 10:44 AM  Between 7am to 6pm - Pager - [phone] After 6pm go to [email or web address] - password [email or web address] [facility]  Office  [phone]  CC: Primary care physician; [name], NP  Note: This dictation was prepared with Dragon dictation along with smaller phrase technology. Any transcriptional errors that result from this process are unintentional.

## 2017-04-04 NOTE — Progress Notes (Signed)
PT Cancellation Note  Patient Details Name: [name] MRN: [id number] DOB: [birth year]   Cancelled Treatment:    Reason Eval/Treat Not Completed: Patient at procedure or test/unavailable.  Will try again Monday.   Ramond Dial 04/04/2017, 5:25 PM   Mee Hives, PT MS Acute Rehab Dept. Number: [place] and [place]

## 2017-04-04 NOTE — Progress Notes (Signed)
Pt requesting for a breathing treatment. Next schedule was at 4am. [employer] and he ordered one time dose of PRN Duoneb Will continue to monitor.

## 2017-04-04 NOTE — Plan of Care (Signed)
  Progressing Clinical Measurements: Respiratory complications will improve 04/04/2017 0052 - Progressing by Liliane Channel, RN Elimination: Will not experience complications related to bowel motility 04/04/2017 0052 - Progressing by Liliane Channel, RN Pain Managment: General experience of comfort will improve 04/04/2017 0052 - Progressing by Liliane Channel, RN Safety: Ability to remain free from injury will improve 04/04/2017 0052 - Progressing by Liliane Channel, RN Skin Integrity: Risk for impaired skin integrity will decrease 04/04/2017 0052 - Progressing by Liliane Channel, RN

## 2017-04-05 LAB — GLUCOSE, CAPILLARY
GLUCOSE-CAPILLARY: 158 mg/dL — AB (ref 65–99)
GLUCOSE-CAPILLARY: 197 mg/dL — AB (ref 65–99)
GLUCOSE-CAPILLARY: 236 mg/dL — AB (ref 65–99)
Glucose-Capillary: 168 mg/dL — ABNORMAL HIGH (ref 65–99)
Glucose-Capillary: 141 mg/dL — ABNORMAL HIGH (ref 65–99)

## 2017-04-05 LAB — BASIC METABOLIC PANEL
ANION GAP: 13 (ref 5–15)
BUN: 79 mg/dL — ABNORMAL HIGH (ref 6–20)
CALCIUM: 8.8 mg/dL — AB (ref 8.9–10.3)
CO2: 38 mmol/L — ABNORMAL HIGH (ref 22–32)
Chloride: 86 mmol/L — ABNORMAL LOW (ref 101–111)
Creatinine, Ser: 2.93 mg/dL — ABNORMAL HIGH (ref 0.44–1.00)
GFR calc Af Amer: 21 mL/min — ABNORMAL LOW (ref >60)
GFR, EST NON AFRICAN AMERICAN: 18 mL/min — AB (ref >60)
Glucose, Bld: 188 mg/dL — ABNORMAL HIGH (ref 65–99)
POTASSIUM: 4.7 mmol/L (ref 3.5–5.1)
SODIUM: 137 mmol/L (ref 135–145)

## 2017-04-05 LAB — CREATININE CLEARANCE, URINE, 24 HOUR
CREAT CLEAR: 16 mL/min — AB (ref 75–115)
Collection Interval-CRCL: 24 hours
Creatinine, 24H Ur: 671 mg/d (ref 600–1800)
Creatinine, Urine: 61 mg/dL
Urine Total Volume-CRCL: 1100 mL

## 2017-04-05 MED ORDER — ALBUTEROL SULFATE HFA 108 (90 BASE) MCG/ACT IN AERS: 2.0000 | INHALATION_SPRAY | RESPIRATORY_TRACT | Status: DC | PRN

## 2017-04-05 MED ORDER — IPRATROPIUM-ALBUTEROL 0.5-2.5 (3) MG/3ML IN SOLN
3.0000 mL | Freq: Four times a day (QID) | RESPIRATORY_TRACT | Status: DC | PRN
  Administered 2017-04-05 – 2017-04-06 (×3): 3 mL via RESPIRATORY_TRACT
  Filled 2017-04-05 (×4): qty 3

## 2017-04-05 NOTE — Progress Notes (Signed)
Dialysis treatment started. 

## 2017-04-05 NOTE — Progress Notes (Signed)
[facility], [place] 04/05/17  Subjective:   Patient received an IJ dialysis catheter yesterday.  She tolerated fluid removal well with UF only treatment About 2.5 L of fluid was removed 24-hour urine for creatinine clearance is also in progress Appetite is poor No shortness of breath  Objective:  Vital signs in last 24 hours:  Temp:  [97.9 F (36.6 C)-99.1 F (37.3 C)] 98 F (36.7 C) (02/09 1045) Pulse Rate:  [81-103] 95 (02/09 1052) Resp:  [10-30] 24 (02/09 1052) BP: (132-189)/(68-115) 132/81 (02/09 1045) SpO2:  [91 %-100 %] 94 % (02/09 1052) Weight:  [169.6 kg (373 lb 12.8 oz)-175.7 kg (387 lb 5.6 oz)] 169.6 kg (373 lb 12.8 oz) (02/09 0500)  Weight change: 2.154 kg (4 lb 12 oz) Filed Weights   04/04/17 1845 04/04/17 2111 04/05/17 0500  Weight: (!) 175.7 kg (387 lb 5.6 oz) (!) 173.1 kg (381 lb 9.9 oz) (!) 169.6 kg (373 lb 12.8 oz)    Intake/Output:    Intake/Output Summary (Last 24 hours) at 04/05/2017 1219 Last data filed at 04/04/2017 2111 Gross per 24 hour  Intake -  Output 2716 ml  Net -2716 ml     Physical Exam: General: Patient lying in bed. NAD.  HEENT Normocephalic and atraumatic.  Neck JVD difficult to access d/t body habitus.   Pulm/lungs Lungs with b/l crackles and diminished breath sounds in b/l lower lobes. [place] 02 at 4L/min  CVS/Heart RRR. S4 heart sound.  Abdomen:  Very distended and firm d/t fluid retention  Extremities: 3+ b/l lower extremity pitting edema  Neurologic:  Alert, oriented, able to answer questions  Skin: Venous stasis changes noted b/l lower extremities          Basic Metabolic Panel:  Recent Labs  Lab 04/01/17 0553 04/02/17 0447 04/03/17 0708 04/04/17 0417 04/05/17 0529  NA 137 137 138 135 137  K 3.9 4.3 4.6 4.8 4.7  CL 89* 88* 87* 87* 86*  CO2 36* 34* 40* 39* 38*  GLUCOSE 181* 219* 173* 210* 188*  BUN 62* 67* 70* 73* 79*  CREATININE 2.65* 2.78* 3.00* 3.02* 2.93*  CALCIUM 8.3* 8.2* 8.5* 8.2*  8.8*     CBC: No results for input(s): WBC, NEUTROABS, HGB, HCT, MCV, PLT in the last 168 hours.   No results found for: HEPBSAG, HEPBSAB, HEPBIGM    Microbiology:  No results found for this or any previous visit (from the past 240 hour(s)).  Coagulation Studies: No results for input(s): LABPROT, INR in the last 72 hours.  Urinalysis: Recent Labs    04/02/17 1548  COLORURINE YELLOW*  LABSPEC 1.008  PHURINE 5.0  GLUCOSEU NEGATIVE  HGBUR NEGATIVE  BILIRUBINUR NEGATIVE  KETONESUR NEGATIVE  PROTEINUR 100*  NITRITE NEGATIVE  LEUKOCYTESUR NEGATIVE      Imaging: Dg Chest Port 1 View  Result Date: 04/04/2017 CLINICAL DATA:  Status post central line placement. EXAM: PORTABLE CHEST 1 VIEW COMPARISON:  03/24/2017. FINDINGS: Interval left jugular catheter extending into the superior vena cava. The tip is not visualized due to under exposure of the overlying heart and soft tissues as a result of the large size of the patient and patient rotation to the right. The pulmonary vasculature and interstitial markings are mildly prominent. No gross change in enlargement of the cardiac silhouette. No pneumothorax is seen. The visualized bones are unremarkable. IMPRESSION: 1. Limited examination demonstrating an interval left jugular catheter with its tip not visualized, possibly at the junction of the superior vena cava and  right atrium. 2. No pneumothorax. 3. Grossly stable cardiomegaly with mildly progressive pulmonary vascular congestion and possible mild interstitial pulmonary edema. Electronically Signed   By: Claudie Revering M.D.   On: 04/04/2017 17:45     Medications:   . sodium chloride     . aspirin EC  81 mg Oral Daily  . atorvastatin  80 mg Oral q1800  . citalopram  40 mg Oral Daily  . feeding supplement (NEPRO CARB STEADY)  237 mL Oral TID WC  . heparin  5,000 Units Subcutaneous Q8H  . hydrocerin   Topical BID  . insulin aspart  0-15 Units Subcutaneous TID WC  . insulin aspart   0-5 Units Subcutaneous QHS  . insulin glargine  12 Units Subcutaneous QHS  . metoprolol tartrate  50 mg Oral BID  . multivitamin with minerals  1 tablet Oral Daily  . sodium chloride flush  3 mL Intravenous Q12H   sodium chloride, acetaminophen **OR** acetaminophen, cyclobenzaprine, diphenhydrAMINE, ipratropium-albuterol, ondansetron **OR** ondansetron (ZOFRAN) IV, polyethylene glycol, sodium chloride, sodium chloride flush  Assessment/ Plan:  47 y.o. African American morbidly obese female with h/o diastolic congestive heart failure, chronic kidney disease stage 3-4, HTN, DM, iron deficiency anemia, medication and treatment noncompliance admitted following appointment at the [facility] when she presented with diffuse edema and pain most notable in b/l lower extremities and abdomen.   1. Acute on chronic kidney disease stage 3   - Baseline Cr 1.57 (12/2016)/ GFR 45 -  Admit Cr 2.65--> 2.78 --> 3.0 (2/7). Creatinine 2.93 today -  Weight 180kg at admission --> 169 kg today (bed weights) -  Increase in Creatinine could be due to progression of underlying CKD vs abnormal cardio-renal hemodynamics leading to intravascular volume depletion. -Discontinue IV diuretics - HOLD ARB losartan until Creatinine stabilizes.  Patient had her first ultrafiltration session on 04/04/2017.  2.5 L were removed Longer ultrafiltration session today with some dialysis Await 24-hour urine creatinine clearance results Next ultrafiltration session is planned for tomorrow  2. Acute on chronic diastolic congestive heart failure (grade 1 diastolic dysfunction) with [place] - 2018 Cardiac studies: Normal LVEF with mild to moderate MR and grade 1 diastolic dysfunction. Mildly dilated LV with EF ~55% and normal wall motion.  - Cardiology team following - UF/ fluid removal with dialysis as tolerated  3. DM-2 with CKD - A1C 13.2 (11/2014) --> 7.3 (02/2017) - Continue to monitor glucose - management as per Primary  team   LOS: [place] 2/9/201912:19 [place], [place]

## 2017-04-05 NOTE — Progress Notes (Signed)
Pre dialysis assessment 

## 2017-04-05 NOTE — Progress Notes (Signed)
[facility] at [place] NAME: [name]    MR#:  [id number]  DATE OF BIRTH:  June 05, 1970  SUBJECTIVE:   No acute events overnight, had her first dialysis session yesterday. Plan for another dialysis today. [name] husband is at bedside. Patient denies any worsening shortness of breath or chest pains or nausea/vomiting.  REVIEW OF SYSTEMS:    Review of Systems  Constitutional: Negative for chills and fever.  HENT: Negative for congestion and tinnitus.   Eyes: Negative for blurred vision and double vision.  Respiratory: Negative for cough, shortness of breath and wheezing.   Cardiovascular: Positive for leg swelling. Negative for chest pain, orthopnea and PND.  Gastrointestinal: Negative for abdominal pain, diarrhea, nausea and vomiting.  Genitourinary: Negative for dysuria and hematuria.  Neurological: Negative for dizziness, sensory change and focal weakness.  All other systems reviewed and are negative.   Nutrition: Heart Healthy/Carb modified Tolerating Diet: Yes Tolerating PT: Await Eval.   DRUG ALLERGIES:   Allergies  Allergen Reactions  . Dust Mite Extract Shortness Of Breath and Itching  . Mold Extract [Trichophyton] Shortness Of Breath and Itching  . Feraheme [Ferumoxytol] Itching  . Pollen Extract   . Sulfa Antibiotics Itching    VITALS:  Blood pressure (!) 142/90, pulse (!) 48, temperature 98 F (36.7 C), temperature source Axillary, resp. rate (!) 25, height 5\' 4"  (1.626 m), weight (!) 169.5 kg (373 lb 10.9 oz), last menstrual period 09/06/2016, SpO2 96 %.  PHYSICAL EXAMINATION:   Physical Exam  GENERAL:  47 y.o.-year-old morbidly obese patient lying in bed in no acute distress.  EYES: Pupils equal, round, reactive to light and accommodation. No scleral icterus. Extraocular muscles intact.  HEENT: Head atraumatic, normocephalic. Oropharynx and nasopharynx clear.  NECK:  Supple, no jugular venous distention. No  thyroid enlargement, no tenderness.  LUNGS: Normal breath sounds bilaterally, no wheezing, rales, rhonchi. No use of accessory muscles of respiration.  CARDIOVASCULAR: S1, S2 normal. No murmurs, rubs, or gallops.  ABDOMEN: Soft, nontender, Protuberant, nondistended. Bowel sounds present. No organomegaly or mass.  EXTREMITIES: No cyanosis, clubbing, +2 edema b/l  NEUROLOGIC: Cranial nerves II through XII are intact. No focal Motor or sensory deficits b/l. Globally weak.    PSYCHIATRIC: The patient is alert and oriented x 3.  SKIN: No obvious rash, lesion, or ulcer.   Left IJ Dialysis access in place.   LABORATORY PANEL:   CBC No results for input(s): WBC, HGB, HCT, PLT in the last 168 hours. ------------------------------------------------------------------------------------------------------------------  Chemistries  Recent Labs  Lab 04/05/17 0529  NA 137  K 4.7  CL 86*  CO2 38*  GLUCOSE 188*  BUN 79*  CREATININE 2.93*  CALCIUM 8.8*   ------------------------------------------------------------------------------------------------------------------  Cardiac Enzymes No results for input(s): TROPONINI in the last 168 hours. ------------------------------------------------------------------------------------------------------------------  RADIOLOGY:  Dg Chest Port 1 View  Result Date: 04/04/2017 CLINICAL DATA:  Status post central line placement. EXAM: PORTABLE CHEST 1 VIEW COMPARISON:  03/24/2017. FINDINGS: Interval left jugular catheter extending into the superior vena cava. The tip is not visualized due to under exposure of the overlying heart and soft tissues as a result of the large size of the patient and patient rotation to the right. The pulmonary vasculature and interstitial markings are mildly prominent. No gross change in enlargement of the cardiac silhouette. No pneumothorax is seen. The visualized bones are unremarkable. IMPRESSION: 1. Limited examination demonstrating  an interval left jugular catheter with its tip not visualized, possibly at the  junction of the superior vena cava and right atrium. 2. No pneumothorax. 3. Grossly stable cardiomegaly with mildly progressive pulmonary vascular congestion and possible mild interstitial pulmonary edema. Electronically Signed   By: Claudie Revering M.D.   On: 04/04/2017 17:45     ASSESSMENT AND PLAN:   47 year old female with morbid obesity, chronic diastolic heart failure, COPD, chronic kidney disease stage III, who presented with shortness of breath.  1. Acute on chronic hypoxic respiratory failure due to acute CHF exacerbation -continue O2 supplementation, continue dialysis for fluid removal. Patient is clinically stable but remains volume overloaded. She is on her baseline oxygen at 4 L  2. Acute on chronic diastolic heart failure with anasarca: -despite getting IV diuretics patient continues to be anasarca. Started on hemodialysis yesterday. -Patient had her first dialysis yesterday and plan for additional treatment today with plan for 4 L of fluid to be removed. - cont. Metoprolol.  - follow I's and O's and daily weights.   3. Diabetes: Continue Lantus with sliding scale and ADA diet - BS STable.   4. Essential hypertension: cont. Metoprolol  5. Hyperlipidemia: Continue atorvastatin  6. Depression: Continue Celexa  7. Acute on Chronic kidney disease stage III: pt. Has progressed to CKD Stage V - no started on HD as per Nephrology. Had HD yesterday and plan for another treatment today.  - cont. Further car as per Nephrology. Await 24 hr Cr. Clearance.     All the records are reviewed and case discussed with Care Management/Social Worker. Management plans discussed with the patient, family and they are in agreement.  CODE STATUS: Full Code  DVT Prophylaxis: Hep. SQ  TOTAL TIME TAKING CARE OF THIS PATIENT: 30 minutes.   POSSIBLE D/C IN 3-5 DAYS, DEPENDING ON CLINICAL  CONDITION.   [name] M.D on 04/05/2017 at 3:52 PM  Between 7am to 6pm - Pager - [phone]  After 6pm go to [email or web address] - Technical brewer [facility]  Office  [phone]  CC: Primary care physician; [name], NP

## 2017-04-05 NOTE — Progress Notes (Signed)
Post dialysis assessment 

## 2017-04-05 NOTE — Progress Notes (Signed)
Patient was transferred to new bed right after dialysis, and the weight read 100 lbs over her previous weight. Pt  refuses to be put in lift to be able to zero out the bed. She stated she feels like she'll be able to stand up to weigh tomorrow, but she cannot stand today. For now we are unable to obtain an accurate weight.

## 2017-04-05 NOTE — Progress Notes (Signed)
Dialysis treatment ended safely.

## 2017-04-06 LAB — HEPATITIS B SURFACE ANTIGEN: HEP B S AG: NEGATIVE

## 2017-04-06 LAB — GLUCOSE, CAPILLARY
GLUCOSE-CAPILLARY: 167 mg/dL — AB (ref 65–99)
GLUCOSE-CAPILLARY: 127 mg/dL — AB (ref 65–99)
GLUCOSE-CAPILLARY: 155 mg/dL — AB (ref 65–99)
Glucose-Capillary: 161 mg/dL — ABNORMAL HIGH (ref 65–99)

## 2017-04-06 LAB — HEPATITIS B SURFACE ANTIBODY, QUANTITATIVE: HEPATITIS B-POST: 13 m[IU]/mL

## 2017-04-06 MED ORDER — IPRATROPIUM-ALBUTEROL 0.5-2.5 (3) MG/3ML IN SOLN
3.0000 mL | RESPIRATORY_TRACT | Status: DC | PRN
  Administered 2017-04-06 – 2017-04-28 (×76): 3 mL via RESPIRATORY_TRACT
  Filled 2017-04-06 (×80): qty 3

## 2017-04-06 NOTE — Progress Notes (Signed)
Pt. Cat napped throughout the night, c/o pain, medicated x2 with effective results each time. No signs or SOB or acute distress noted. Husband continues at bedside.

## 2017-04-06 NOTE — Progress Notes (Signed)
[facility], [place] 04/06/17  Subjective:   Patient received dialysis via IJ dialysis catheter yesterday.  She tolerated fluid removal well. About 2.5 L of fluid was removed 24-hour urine for creatinine clearance is 16 cc/min Appetite is poor No shortness of breath  Objective:  Vital signs in last 24 hours:  Temp:  [98 F (36.7 C)-99 F (37.2 C)] 98.7 F (37.1 C) (02/10 0759) Pulse Rate:  [48-102] 75 (02/10 0759) Resp:  [12-34] 18 (02/10 0322) BP: (117-154)/(61-116) 123/61 (02/10 0759) SpO2:  [89 %-100 %] 100 % (02/10 0759) Weight:  [169.5 kg (373 lb 10.9 oz)-225.9 kg (498 lb)] 225.9 kg (498 lb) (02/10 0322)  Weight change: -6.2 kg (-10.7 oz) Filed Weights   04/05/17 0500 04/05/17 1045 04/06/17 0322  Weight: (!) 169.6 kg (373 lb 12.8 oz) (!) 169.5 kg (373 lb 10.9 oz) (!) 225.9 kg (498 lb)    Intake/Output:    Intake/Output Summary (Last 24 hours) at 04/06/2017 1022 Last data filed at 04/05/2017 1402 Gross per 24 hour  Intake -  Output 3686 ml  Net -3686 ml     Physical Exam: General: Patient lying in bed. NAD.  HEENT Normocephalic and atraumatic.  Neck JVD difficult to access d/t body habitus.   Pulm/lungs Lungs with b/l crackles and diminished breath sounds in b/l lower lobes. [place] 02   CVS/Heart RRR. S4 heart sound.  Abdomen:  Very distended and firm d/t fluid retention  Extremities: 3+ b/l lower extremity pitting edema  Neurologic:  Alert, oriented, able to answer questions  Skin: Venous stasis changes noted b/l lower extremities          Basic Metabolic Panel:  Recent Labs  Lab 04/01/17 0553 04/02/17 0447 04/03/17 0708 04/04/17 0417 04/05/17 0529  NA 137 137 138 135 137  K 3.9 4.3 4.6 4.8 4.7  CL 89* 88* 87* 87* 86*  CO2 36* 34* 40* 39* 38*  GLUCOSE 181* 219* 173* 210* 188*  BUN 62* 67* 70* 73* 79*  CREATININE 2.65* 2.78* 3.00* 3.02* 2.93*  CALCIUM 8.3* 8.2* 8.5* 8.2* 8.8*     CBC: No results for input(s): WBC,  NEUTROABS, HGB, HCT, MCV, PLT in the last 168 hours.    Lab Results  Component Value Date   HEPBSAG Negative 04/04/2017      Microbiology:  No results found for this or any previous visit (from the past 240 hour(s)).  Coagulation Studies: No results for input(s): LABPROT, INR in the last 72 hours.  Urinalysis: No results for input(s): COLORURINE, LABSPEC, PHURINE, GLUCOSEU, HGBUR, BILIRUBINUR, KETONESUR, PROTEINUR, UROBILINOGEN, NITRITE, LEUKOCYTESUR in the last 72 hours.  Invalid input(s): APPERANCEUR    Imaging: Dg Chest Port 1 View  Result Date: 04/04/2017 CLINICAL DATA:  Status post central line placement. EXAM: PORTABLE CHEST 1 VIEW COMPARISON:  03/24/2017. FINDINGS: Interval left jugular catheter extending into the superior vena cava. The tip is not visualized due to under exposure of the overlying heart and soft tissues as a result of the large size of the patient and patient rotation to the right. The pulmonary vasculature and interstitial markings are mildly prominent. No gross change in enlargement of the cardiac silhouette. No pneumothorax is seen. The visualized bones are unremarkable. IMPRESSION: 1. Limited examination demonstrating an interval left jugular catheter with its tip not visualized, possibly at the junction of the superior vena cava and right atrium. 2. No pneumothorax. 3. Grossly stable cardiomegaly with mildly progressive pulmonary vascular congestion and possible mild interstitial pulmonary edema.  Electronically Signed   By: Claudie Revering M.D.   On: 04/04/2017 17:45     Medications:   . sodium chloride     . aspirin EC  81 mg Oral Daily  . atorvastatin  80 mg Oral q1800  . citalopram  40 mg Oral Daily  . feeding supplement (NEPRO CARB STEADY)  237 mL Oral TID WC  . heparin  5,000 Units Subcutaneous Q8H  . hydrocerin   Topical BID  . insulin aspart  0-15 Units Subcutaneous TID WC  . insulin aspart  0-5 Units Subcutaneous QHS  . insulin glargine  12  Units Subcutaneous QHS  . metoprolol tartrate  50 mg Oral BID  . multivitamin with minerals  1 tablet Oral Daily  . sodium chloride flush  3 mL Intravenous Q12H   sodium chloride, acetaminophen **OR** acetaminophen, cyclobenzaprine, diphenhydrAMINE, ipratropium-albuterol, ondansetron **OR** ondansetron (ZOFRAN) IV, polyethylene glycol, sodium chloride, sodium chloride flush  Assessment/ Plan:  47 y.o. African American morbidly obese female with h/o diastolic congestive heart failure, chronic kidney disease stage 3-4, HTN, DM, iron deficiency anemia, medication and treatment noncompliance admitted following appointment at the [facility] when she presented with diffuse edema and pain most notable in b/l lower extremities and abdomen.   1. Acute on chronic kidney disease stage 3   - Baseline Cr 1.57 (12/2016)/ GFR 45 -  Admit Cr 2.65--> 2.78 --> 3.0 (2/7).  - 24 hr urine Creatinine clearance is 16 cc/min (04/04/2017) -  Weight 180kg at admission --> 169 kg (bed weights) -  Discontinue IV diuretics -  HOLD ARB losartan until Creatinine stabilizes.  Patient had her first ultrafiltration session on 04/04/2017.  So far 2.5 L have been removed Patient will require daily dialysis for now At the end of the week we can see if there is any renal recovery Further plans as clinical course progresses  2. Acute on chronic diastolic congestive heart failure (grade 1 diastolic dysfunction) with [place] - 2018 Cardiac studies: Normal LVEF with mild to moderate MR and grade 1 diastolic dysfunction. Mildly dilated LV with EF ~55% and normal wall motion.  - Cardiology team following - UF/ fluid removal with dialysis as tolerated  3. DM-2 with CKD - A1C 13.2 (11/2014) --> 7.3 (02/2017) - Continue to monitor glucose - management as per Primary team   LOS: [place] 2/10/201910:22 [place], [place]

## 2017-04-06 NOTE — Progress Notes (Signed)
Pt's husband brought her coffee and she spilled it on her right arm and breast. Area checked no redness or blisters noted. Pt. Cleaned and clothing changed. Will inform day nurse.

## 2017-04-07 ENCOUNTER — Encounter: Payer: Self-pay | Admitting: Vascular Surgery

## 2017-04-07 LAB — GLUCOSE, CAPILLARY
Glucose-Capillary: 127 mg/dL — ABNORMAL HIGH (ref 65–99)
Glucose-Capillary: 124 mg/dL — ABNORMAL HIGH (ref 65–99)
Glucose-Capillary: 147 mg/dL — ABNORMAL HIGH (ref 65–99)
Glucose-Capillary: 144 mg/dL — ABNORMAL HIGH (ref 65–99)

## 2017-04-07 MED ORDER — RENA-VITE PO TABS
1.0000 | ORAL_TABLET | Freq: Every day | ORAL | Status: DC
  Administered 2017-04-07 – 2017-04-27 (×19): 1 via ORAL
  Filled 2017-04-07 (×22): qty 1

## 2017-04-07 NOTE — Progress Notes (Signed)
HD tx start 

## 2017-04-07 NOTE — Progress Notes (Signed)
Pre HD assessment  

## 2017-04-07 NOTE — Progress Notes (Signed)
Post HD assessment  

## 2017-04-07 NOTE — Progress Notes (Signed)
HD tx end  

## 2017-04-07 NOTE — Progress Notes (Signed)
Physical Therapy Treatment Patient Details Name: [name] MRN: [id number] DOB: [birth year] Today's Date: 04/07/2017    History of Present Illness Pt is a80 y.o.femalewith a known history of systolic CHF, hypertension, diabetes, morbid obesity who was recently discharged from the hospital returns from CHF clinic due to weight gain, worsening shortness of breath. Attempts were made to start IV Lasix through home health but were unsuccessful. Also due to her severe shortness of breath was sent to the emergency room. Here chest x-ray shows pulmonary edema and right effusion which is chronic. Patient has been compliant with medications, fluid restriction and salt restriction.  Assessment includes: Acute on chronic diastolic congestive heart failure, acute on chronic respiratory failure with tachypnea due to CHF, HTN, DM, elevated troponin likely due to CHF, and CKD stage IV.    PT Comments    Pt initially refuses; but agreeable with gentle encouragement. Pt participates in limited bed exercise session only. Limited ranges throughout; pt continues to complain of B knee pain and notes left leg feels heavy and weak. Pt refuses up to the edge of bed at this time; remains rather disengaged throughout session. Continue PT to progress participation, strength, endurance to improve functional mobility and ability.    Follow Up Recommendations  SNF     Equipment Recommendations       Recommendations for Other Services       Precautions / Restrictions Precautions Precautions: Fall Restrictions Weight Bearing Restrictions: No    Mobility  Bed Mobility               General bed mobility comments: Not tested; Attempted moving from semi R side lying to supine and pt unable   Transfers                    Ambulation/Gait                 Stairs            Wheelchair Mobility    Modified Rankin (Stroke Patients Only)       Balance                                             Cognition Arousal/Alertness: Lethargic Behavior During Therapy: WFL for tasks assessed/performed Overall Cognitive Status: Within Functional Limits for tasks assessed                                        Exercises General Exercises - Lower Extremity Ankle Circles/Pumps: AROM;Both;20 reps;Sidelying Quad Sets: Strengthening;Both;10 reps;Sidelying Gluteal Sets: Strengthening;Both;10 reps;Sidelying Short Arc Quad: AROM;Both;10 reps;Sidelying Heel Slides: AROM;Both;10 reps;Sidelying Hip ABduction/ADduction: AAROM;Left;10 reps(requires assist due to semi R side lying) Straight Leg Raises: AROM;Both;10 reps;Sidelying    General Comments        Pertinent Vitals/Pain Pain Assessment: Faces Faces Pain Scale: Hurts little more Pain Location: B knees Pain Descriptors / Indicators: Constant;Aching Pain Intervention(s): Monitored during session    Home Living                      Prior Function            PT Goals (current goals can now be found in the care plan section) Progress towards PT goals: Not progressing toward goals -  comment    Frequency    Min 2X/week      PT Plan Current plan remains appropriate    Co-evaluation              AM-PAC PT "6 Clicks" Daily Activity  Outcome Measure  Difficulty turning over in bed (including adjusting bedclothes, sheets and blankets)?: Unable Difficulty moving from lying on back to sitting on the side of the bed? : Unable Difficulty sitting down on and standing up from a chair with arms (e.g., wheelchair, bedside commode, etc,.)?: Unable Help needed moving to and from a bed to chair (including a wheelchair)?: Total Help needed walking in hospital room?: Total Help needed climbing 3-5 steps with a railing? : Total 6 Click Score: 6    End of Session Equipment Utilized During Treatment: Oxygen Activity Tolerance: Patient limited by fatigue;Patient limited by  lethargy Patient left: in bed;with call bell/phone within reach;with bed alarm set;with family/visitor present   PT Visit Diagnosis: Difficulty in walking, not elsewhere classified (R26.2);Muscle weakness (generalized) (M62.81)     Time: 2712-9290 PT Time Calculation (min) (ACUTE ONLY): 17 min  Charges:  $Therapeutic Exercise: 8-22 mins                    G Codes:      Larae Grooms, PTA 04/07/2017, 5:20 PM

## 2017-04-07 NOTE — Plan of Care (Signed)
Pt is A&Ox4. VSS. 4L O2 [place] . NSR on monitor. Family at bedside. Pt encouraged to move and work to reposition self in bed.  Pt can slide self up with encouragement. Contact isolation precautions maintained. Foley catheter remains in place. Will continue to monitor and report to oncoming RN .  Progressing Education: Knowledge of General Education information will improve 04/07/2017 1517 - Progressing by Aleen Campi, RN Health Behavior/Discharge Planning: Ability to manage health-related needs will improve 04/07/2017 1517 - Progressing by Aleen Campi, RN Clinical Measurements: Ability to maintain clinical measurements within normal limits will improve 04/07/2017 1517 - Progressing by Aleen Campi, RN Will remain free from infection 04/07/2017 1517 - Progressing by Aleen Campi, RN Diagnostic test results will improve 04/07/2017 1517 - Progressing by Aleen Campi, RN Respiratory complications will improve 04/07/2017 1517 - Progressing by Aleen Campi, RN Cardiovascular complication will be avoided 04/07/2017 1517 - Progressing by Aleen Campi, RN Activity: Risk for activity intolerance will decrease 04/07/2017 1517 - Progressing by Aleen Campi, RN Nutrition: Adequate nutrition will be maintained 04/07/2017 1517 - Progressing by Aleen Campi, RN Coping: Level of anxiety will decrease 04/07/2017 1517 - Progressing by Aleen Campi, RN Elimination: Will not experience complications related to bowel motility 04/07/2017 1517 - Progressing by Aleen Campi, RN Will not experience complications related to urinary retention 04/07/2017 1517 - Progressing by Aleen Campi, RN Pain Managment: General experience of comfort will improve 04/07/2017 1517 - Progressing by Aleen Campi, RN Safety: Ability to remain free from injury will improve 04/07/2017 1517 - Progressing by Aleen Campi, RN Skin Integrity: Risk for impaired skin integrity will decrease 04/07/2017 1517 - Progressing by Aleen Campi, RN Education: Ability to demonstrate management of disease process will improve 04/07/2017 1517 - Progressing by Aleen Campi, RN Ability to verbalize understanding of medication therapies will improve 04/07/2017 1517 - Progressing by Aleen Campi, RN Activity: Capacity to carry out activities will improve 04/07/2017 1517 - Progressing by Aleen Campi, RN Cardiac: Ability to achieve and maintain adequate cardiopulmonary perfusion will improve 04/07/2017 1517 - Progressing by Aleen Campi, RN Spiritual Needs Ability to function at adequate level 04/07/2017 1517 - Progressing by Aleen Campi, RN

## 2017-04-07 NOTE — Progress Notes (Signed)
Post HD assessment. Pt tolerated tx well with no c/o or complications. Net UF removed 5517 ml, goal met.

## 2017-04-07 NOTE — Progress Notes (Signed)
[facility] [place] NAME: [name]    MR#:  [id number]  DATE OF BIRTH:  18-Mar-1970  SUBJECTIVE:   Patient going for her hemodialysis again today. No other acute events overnight.  REVIEW OF SYSTEMS:    Review of Systems  Constitutional: Negative for chills and fever.  HENT: Negative for congestion and tinnitus.   Eyes: Negative for blurred vision and double vision.  Respiratory: Negative for cough, shortness of breath and wheezing.   Cardiovascular: Positive for leg swelling. Negative for chest pain, orthopnea and PND.  Gastrointestinal: Negative for abdominal pain, diarrhea, nausea and vomiting.  Genitourinary: Negative for dysuria and hematuria.  Neurological: Negative for dizziness, sensory change and focal weakness.  All other systems reviewed and are negative.   Nutrition: Heart Healthy/Carb modified Tolerating Diet: Yes Tolerating PT: Await Eval.   DRUG ALLERGIES:   Allergies  Allergen Reactions  . Dust Mite Extract Shortness Of Breath and Itching  . Mold Extract [Trichophyton] Shortness Of Breath and Itching  . Feraheme [Ferumoxytol] Itching  . Pollen Extract   . Sulfa Antibiotics Itching    VITALS:  Blood pressure 122/73, pulse 77, temperature 98.4 F (36.9 C), temperature source Oral, resp. rate 18, height 5\' 4"  (1.626 m), weight (!) 225 kg (496 lb), last menstrual period 09/06/2016, SpO2 94 %.  PHYSICAL EXAMINATION:   Physical Exam  GENERAL:  47 y.o.-year-old morbidly obese patient lying in bed in no acute distress.  EYES: Pupils equal, round, reactive to light and accommodation. No scleral icterus. Extraocular muscles intact.  HEENT: Head atraumatic, normocephalic. Oropharynx and nasopharynx clear.  NECK:  Supple, no jugular venous distention. No thyroid enlargement, no tenderness.  LUNGS: Normal breath sounds bilaterally, no wheezing, rales, rhonchi. No use of accessory muscles of respiration.   CARDIOVASCULAR: S1, S2 normal. No murmurs, rubs, or gallops.  ABDOMEN: Soft, nontender, Protuberant, distended. Bowel sounds present. No organomegaly or mass. + Anasarca with ascites.  EXTREMITIES: No cyanosis, clubbing, +2 edema b/l  NEUROLOGIC: Cranial nerves II through XII are intact. No focal Motor or sensory deficits b/l. Globally weak.    PSYCHIATRIC: The patient is alert and oriented x 3.  SKIN: No obvious rash, lesion, or ulcer.   Left IJ Dialysis access in place.   LABORATORY PANEL:   CBC No results for input(s): WBC, HGB, HCT, PLT in the last 168 hours. ------------------------------------------------------------------------------------------------------------------  Chemistries  Recent Labs  Lab 04/05/17 0529  NA 137  K 4.7  CL 86*  CO2 38*  GLUCOSE 188*  BUN 79*  CREATININE 2.93*  CALCIUM 8.8*   ------------------------------------------------------------------------------------------------------------------  Cardiac Enzymes No results for input(s): TROPONINI in the last 168 hours. ------------------------------------------------------------------------------------------------------------------  RADIOLOGY:  No results found.   ASSESSMENT AND PLAN:   47 year old female with morbid obesity, chronic diastolic heart failure, COPD, chronic kidney disease stage III, who presented with shortness of breath.  1. Acute on chronic hypoxic respiratory failure due to acute CHF exacerbation -continue O2 supplementation, continue dialysis for fluid removal. Patient is clinically stable but remains volume overloaded. She is on her baseline oxygen at 4 L  2. Acute on chronic diastolic heart failure with anasarca: -despite getting IV diuretics patient continues to be anasarcic Started on hemodialysis and plan for another bedside HD today.  - cont. Metoprolol.  - follow I's and O's and daily weights.   3. Diabetes: Continue Lantus with sliding scale and ADA diet - BS  STable.   4. Essential hypertension: cont. Metoprolol  5. Hyperlipidemia: Continue atorvastatin  6. Depression: Continue Celexa  7. Acute on Chronic kidney disease stage III: pt. Has progressed to CKD Stage V - now started on HD as per Nephrology. Plan for HD again today. ?? Need for permanent dialysis but will defer to Nephrology.  - Await 24 hr Cr. Clearance.     All the records are reviewed and case discussed with Care Management/Social Worker. Management plans discussed with the patient, family and they are in agreement.  CODE STATUS: Full Code  DVT Prophylaxis: Hep. SQ  TOTAL TIME TAKING CARE OF THIS PATIENT: 25 minutes.   POSSIBLE D/C IN 3-5 DAYS, DEPENDING ON CLINICAL CONDITION.   [name] M.D on 04/07/2017 at 2:15 PM  Between 7am to 6pm - Pager - [phone]  After 6pm go to [email or web address] - [employer] [facility]  Office  [phone]  CC: Primary care physician; [name], NP

## 2017-04-07 NOTE — Progress Notes (Signed)
[facility], [place] 04/07/17  Subjective:  Patient due for hemodialysis today. This will be performed at the bedside.    Objective:  Vital signs in last 24 hours:  Temp:  [98.3 F (36.8 C)-98.7 F (37.1 C)] 98.4 F (36.9 C) (02/11 0746) Pulse Rate:  [77-93] 77 (02/11 0746) Resp:  [1-18] 18 (02/11 0746) BP: (122-139)/(63-78) 122/73 (02/11 0746) SpO2:  [91 %-94 %] 94 % (02/11 0746) Weight:  [225 kg (496 lb)] 225 kg (496 lb) (02/11 0426)  Weight change: 55.5 kg (122 lb 5.1 oz) Filed Weights   04/05/17 1045 04/06/17 0322 04/07/17 0426  Weight: (!) 169.5 kg (373 lb 10.9 oz) (!) 225.9 kg (498 lb) (!) 225 kg (496 lb)    Intake/Output:    Intake/Output Summary (Last 24 hours) at 04/07/2017 1243 Last data filed at 04/06/2017 1937 Gross per 24 hour  Intake 1037 ml  Output 1550 ml  Net -513 ml     Physical Exam: General: Patient lying in bed. NAD  HEENT Normocephalic and atraumatic.  Neck supple  Pulm/lungs Bilateral rales  CVS/Heart RRR no obvious rub  Abdomen:  Distension noted, BS present  Extremities: 3+ b/l lower extremity pitting edema  Neurologic:  Alert, oriented, able to answer questions  Skin: Venous stasis changes noted b/l lower extremities          Basic Metabolic Panel:  Recent Labs  Lab 04/01/17 0553 04/02/17 0447 04/03/17 0708 04/04/17 0417 04/05/17 0529  NA 137 137 138 135 137  K 3.9 4.3 4.6 4.8 4.7  CL 89* 88* 87* 87* 86*  CO2 36* 34* 40* 39* 38*  GLUCOSE 181* 219* 173* 210* 188*  BUN 62* 67* 70* 73* 79*  CREATININE 2.65* 2.78* 3.00* 3.02* 2.93*  CALCIUM 8.3* 8.2* 8.5* 8.2* 8.8*     CBC: No results for input(s): WBC, NEUTROABS, HGB, HCT, MCV, PLT in the last 168 hours.    Lab Results  Component Value Date   HEPBSAG Negative 04/04/2017      Microbiology:  No results found for this or any previous visit (from the past 240 hour(s)).  Coagulation Studies: No results for input(s): LABPROT, INR in the  last 72 hours.  Urinalysis: No results for input(s): COLORURINE, LABSPEC, PHURINE, GLUCOSEU, HGBUR, BILIRUBINUR, KETONESUR, PROTEINUR, UROBILINOGEN, NITRITE, LEUKOCYTESUR in the last 72 hours.  Invalid input(s): APPERANCEUR    Imaging: No results found.   Medications:   . sodium chloride     . aspirin EC  81 mg Oral Daily  . atorvastatin  80 mg Oral q1800  . citalopram  40 mg Oral Daily  . feeding supplement (NEPRO CARB STEADY)  237 mL Oral TID WC  . heparin  5,000 Units Subcutaneous Q8H  . hydrocerin   Topical BID  . insulin aspart  0-15 Units Subcutaneous TID WC  . insulin aspart  0-5 Units Subcutaneous QHS  . insulin glargine  12 Units Subcutaneous QHS  . metoprolol tartrate  50 mg Oral BID  . multivitamin with minerals  1 tablet Oral Daily  . sodium chloride flush  3 mL Intravenous Q12H   sodium chloride, acetaminophen **OR** acetaminophen, cyclobenzaprine, diphenhydrAMINE, ipratropium-albuterol, ondansetron **OR** ondansetron (ZOFRAN) IV, polyethylene glycol, sodium chloride, sodium chloride flush  Assessment/ Plan:  47 y.o. African American morbidly obese female with h/o diastolic congestive heart failure, chronic kidney disease stage 3-4, HTN, DM, iron deficiency anemia, medication and treatment noncompliance admitted following appointment at the [facility] when she presented with diffuse edema  and pain most notable in b/l lower extremities and abdomen.   1. Acute renal failure on chronic kidney disease stage 3   - Baseline Cr 1.57 (12/2016)/ GFR 45 -  Admit Cr 2.65--> 2.78 --> 3.0 (2/7).  - 24 hr urine Creatinine clearance is 16 cc/min (04/04/2017) -  Weight 180kg at admission --> 169 kg (bed weights) -We will plan for bedside dialysis today.  2. Acute on chronic diastolic congestive heart failure (grade 1 diastolic dysfunction) with [place] 2018 Cardiac studies: Normal LVEF with mild to moderate MR and grade 1 diastolic dysfunction. Mildly dilated LV with EF  ~55% and normal wall motion.  -Continue ultrafiltration with dialysis for now.  3. DM-2 with CKD - A1C 13.2 (11/2014) --> 7.3 (02/2017) -Glycemic control as per primary team.   LOS: Ahmeek, Basir Niven 2/11/201912:43 PM  [facility] [place], [place]

## 2017-04-08 ENCOUNTER — Encounter: Payer: Self-pay | Admitting: *Deleted

## 2017-04-08 DIAGNOSIS — I5032 Chronic diastolic (congestive) heart failure: Secondary | ICD-10-CM

## 2017-04-08 LAB — GLUCOSE, CAPILLARY
Glucose-Capillary: 150 mg/dL — ABNORMAL HIGH (ref 65–99)
Glucose-Capillary: 173 mg/dL — ABNORMAL HIGH (ref 65–99)
Glucose-Capillary: 228 mg/dL — ABNORMAL HIGH (ref 65–99)
Glucose-Capillary: 256 mg/dL — ABNORMAL HIGH (ref 65–99)
Glucose-Capillary: 220 mg/dL — ABNORMAL HIGH (ref 65–99)

## 2017-04-08 LAB — CBC
HCT: 30.2 % — ABNORMAL LOW (ref 35.0–47.0)
Hemoglobin: 9.1 g/dL — ABNORMAL LOW (ref 12.0–16.0)
MCH: 25.9 pg — AB (ref 26.0–34.0)
MCHC: 30.1 g/dL — AB (ref 32.0–36.0)
MCV: 86 fL (ref 80.0–100.0)
Platelets: 403 10*3/uL (ref 150–440)
RBC: 3.51 MIL/uL — ABNORMAL LOW (ref 3.80–5.20)
RDW: 16.4 % — AB (ref 11.5–14.5)
WBC: 6.3 10*3/uL (ref 3.6–11.0)

## 2017-04-08 NOTE — Progress Notes (Signed)
[place], [place] 04/08/17  Subjective:  Patient had hemodialysis yesterday. Ultrafiltration Yesterday was 5.5 kg.   Objective:  Vital signs in last 24 hours:  Temp:  [98.1 F (36.7 C)-98.7 F (37.1 C)] 98.1 F (36.7 C) (02/12 1317) Pulse Rate:  [72-88] 82 (02/12 1317) Resp:  [15-20] 19 (02/12 0739) BP: (109-156)/(54-117) 115/54 (02/12 1317) SpO2:  [92 %-100 %] 94 % (02/12 1317) Weight:  [218 kg (480 lb 9.6 oz)-223.2 kg (492 lb)] 218 kg (480 lb 9.6 oz) (02/12 0437)  Weight change: -1.814 kg () Filed Weights   04/07/17 1745 04/07/17 2128 04/08/17 0437  Weight: (!) 223.2 kg (492 lb) (!) 219 kg (482 lb 12.9 oz) (!) 218 kg (480 lb 9.6 oz)    Intake/Output:    Intake/Output Summary (Last 24 hours) at 04/08/2017 1633 Last data filed at 04/08/2017 1125 Gross per 24 hour  Intake 6 ml  Output 6167 ml  Net -6161 ml     Physical Exam: General: Laying in bed, NAD  HEENT Normocephalic and atraumatic.  Neck supple  Pulm/lungs Bilateral rales, normal effort  CVS/Heart RRR no obvious rub  Abdomen:  Distension noted, BS present  Extremities: 3+ b/l lower extremity pitting edema  Neurologic: Alert, oriented, able to answer questions  Skin: Venous stasis changes noted b/l lower extremities          Basic Metabolic Panel:  Recent Labs  Lab 04/02/17 0447 04/03/17 0708 04/04/17 0417 04/05/17 0529  NA 137 138 135 137  K 4.3 4.6 4.8 4.7  CL 88* 87* 87* 86*  CO2 34* 40* 39* 38*  GLUCOSE 219* 173* 210* 188*  BUN 67* 70* 73* 79*  CREATININE 2.78* 3.00* 3.02* 2.93*  CALCIUM 8.2* 8.5* 8.2* 8.8*     CBC: Recent Labs  Lab 04/08/17 0535  WBC 6.3  HGB 9.1*  HCT 30.2*  MCV 86.0  PLT 403      Lab Results  Component Value Date   HEPBSAG Negative 04/04/2017      Microbiology:  No results found for this or any previous visit (from the past 240 hour(s)).  Coagulation Studies: No results for input(s): LABPROT, INR in the last 72  hours.  Urinalysis: No results for input(s): COLORURINE, LABSPEC, PHURINE, GLUCOSEU, HGBUR, BILIRUBINUR, KETONESUR, PROTEINUR, UROBILINOGEN, NITRITE, LEUKOCYTESUR in the last 72 hours.  Invalid input(s): APPERANCEUR    Imaging: No results found.   Medications:   . sodium chloride     . aspirin EC  81 mg Oral Daily  . atorvastatin  80 mg Oral q1800  . citalopram  40 mg Oral Daily  . feeding supplement (NEPRO CARB STEADY)  237 mL Oral TID WC  . heparin  5,000 Units Subcutaneous Q8H  . hydrocerin   Topical BID  . insulin aspart  0-15 Units Subcutaneous TID WC  . insulin aspart  0-5 Units Subcutaneous QHS  . insulin glargine  12 Units Subcutaneous QHS  . metoprolol tartrate  50 mg Oral BID  . multivitamin  1 tablet Oral QHS  . multivitamin with minerals  1 tablet Oral Daily  . sodium chloride flush  3 mL Intravenous Q12H   sodium chloride, acetaminophen **OR** acetaminophen, cyclobenzaprine, diphenhydrAMINE, ipratropium-albuterol, ondansetron **OR** ondansetron (ZOFRAN) IV, polyethylene glycol, sodium chloride, sodium chloride flush  Assessment/ Plan:  47 y.o. African American morbidly obese female with h/o diastolic congestive heart failure, chronic kidney disease stage 3-4, HTN, DM, iron deficiency anemia, medication and treatment noncompliance admitted [facility] when she presented with diffuse edema and pain most notable in b/l lower extremities and abdomen.   1. Acute renal failure on chronic kidney disease stage 3   - Baseline Cr 1.57 (12/2016)/ GFR 45 -  Admit Cr 2.65--> 2.78 --> 3.0 (2/7).  - 24 hr urine Creatinine clearance is 16 cc/min (04/04/2017) - overall patient still has relatively low urine output.  We will plan for hemodialysis with ultrafiltration again tomorrow.  At the moment she will be unable to be placed in an outpatient dialysis Center given her current insurance.  2. Acute on chronic diastolic congestive heart failure  (grade 1 diastolic dysfunction) with [place] 2018 Cardiac studies: Normal LVEF with mild to moderate MR and grade 1 diastolic dysfunction. Mildly dilated LV with EF ~55% and normal wall motion.  -we will plan on ultrafiltration with dialysis again tomorrow.  3. DM-2 with CKD - A1C 13.2 (11/2014) --> 7.3 (02/2017) -Glycemic control as per primary team.   LOS: Young, Collyns Mcquigg 2/12/20194:33 PM  [facility] [place], [place]

## 2017-04-08 NOTE — Progress Notes (Signed)
[facility] [place] NAME: [name]    MR#:  [id number]  DATE OF BIRTH:  1970-09-17  SUBJECTIVE:   No acute events overnight, patient had hemodialysis yesterday and no plan for HD today. Weight has come down from 180-169 (bed weights). No other complaints.   REVIEW OF SYSTEMS:    Review of Systems  Constitutional: Negative for chills and fever.  HENT: Negative for congestion and tinnitus.   Eyes: Negative for blurred vision and double vision.  Respiratory: Negative for cough, shortness of breath and wheezing.   Cardiovascular: Positive for leg swelling. Negative for chest pain, orthopnea and PND.  Gastrointestinal: Negative for abdominal pain, diarrhea, nausea and vomiting.  Genitourinary: Negative for dysuria and hematuria.  Neurological: Negative for dizziness, sensory change and focal weakness.  All other systems reviewed and are negative.   Nutrition: Heart Healthy/Carb modified Tolerating Diet: Yes Tolerating PT: Await Eval.   DRUG ALLERGIES:   Allergies  Allergen Reactions  . Dust Mite Extract Shortness Of Breath and Itching  . Mold Extract [Trichophyton] Shortness Of Breath and Itching  . Feraheme [Ferumoxytol] Itching  . Pollen Extract   . Sulfa Antibiotics Itching    VITALS:  Blood pressure (!) 115/54, pulse 82, temperature 98.1 F (36.7 C), temperature source Oral, resp. rate 19, height 5\' 4"  (1.626 m), weight (!) 218 kg (480 lb 9.6 oz), last menstrual period 09/06/2016, SpO2 94 %.  PHYSICAL EXAMINATION:   Physical Exam  GENERAL:  47 y.o.-year-old morbidly obese patient lying in bed in no acute distress.  EYES: Pupils equal, round, reactive to light and accommodation. No scleral icterus. Extraocular muscles intact.  HEENT: Head atraumatic, normocephalic. Oropharynx and nasopharynx clear.  NECK:  Supple, no jugular venous distention. No thyroid enlargement, no tenderness.  LUNGS: Normal breath sounds  bilaterally, no wheezing, rales, rhonchi. No use of accessory muscles of respiration.  CARDIOVASCULAR: S1, S2 normal. No murmurs, rubs, or gallops.  ABDOMEN: Soft, nontender, Protuberant, distended. Bowel sounds present. No organomegaly or mass. + Anasarca with ascites.  EXTREMITIES: No cyanosis, clubbing, +2 edema b/l  NEUROLOGIC: Cranial nerves II through XII are intact. No focal Motor or sensory deficits b/l. Globally weak.    PSYCHIATRIC: The patient is alert and oriented x 3.  SKIN: No obvious rash, lesion, or ulcer.   Left IJ Dialysis access in place.   LABORATORY PANEL:   CBC Recent Labs  Lab 04/08/17 0535  WBC 6.3  HGB 9.1*  HCT 30.2*  PLT 403   ------------------------------------------------------------------------------------------------------------------  Chemistries  Recent Labs  Lab 04/05/17 0529  NA 137  K 4.7  CL 86*  CO2 38*  GLUCOSE 188*  BUN 79*  CREATININE 2.93*  CALCIUM 8.8*   ------------------------------------------------------------------------------------------------------------------  Cardiac Enzymes No results for input(s): TROPONINI in the last 168 hours. ------------------------------------------------------------------------------------------------------------------  RADIOLOGY:  No results found.   ASSESSMENT AND PLAN:   47 year old female with morbid obesity, chronic diastolic heart failure, COPD, chronic kidney disease stage III, who presented with shortness of breath.  1. Acute on chronic hypoxic respiratory failure due to acute CHF exacerbation -continue O2 supplementation, continue dialysis for fluid removal. Patient is clinically stable but remains volume overloaded. Weight has down since admission.  She is on her baseline oxygen at 4 L  2. Acute on chronic diastolic heart failure with anasarca: -despite getting IV diuretics patient continues to be anasarcic Started on hemodialysis and cont. Further HD as per Nephrology.  Had HD yesterday and plan for  another treatment tomorrow.  - cont. Metoprolol.  - Weight has come down since admission bed weights (180-169 kg's)  3. Diabetes: Continue Lantus with sliding scale and ADA diet - BS STable.   4. Essential hypertension: cont. Metoprolol  5. Hyperlipidemia: Continue atorvastatin  6. Depression: Continue Celexa  7. Acute on Chronic kidney disease stage III: pt. Has progressed to CKD Stage V - now started on HD and had treatment yesterday and plan for HD again tomorrow. ?? Need for permanent dialysis but will defer to Nephrology.  - Cr. Clearance is 16 as of 04/04/17    All the records are reviewed and case discussed with Care Management/Social Worker. Management plans discussed with the patient, family and they are in agreement.  CODE STATUS: Full Code  DVT Prophylaxis: Hep. SQ  TOTAL TIME TAKING CARE OF THIS PATIENT: 30 minutes.   POSSIBLE D/C IN 3-5 DAYS, DEPENDING ON CLINICAL CONDITION.   [name] M.D on 04/08/2017 at 1:30 PM  Between 7am to 6pm - Pager - [phone]  After 6pm go to [email or web address] - [employer] [facility]  Office  [phone]  CC: Primary care physician; [name], NP

## 2017-04-08 NOTE — Plan of Care (Signed)
No complaints of pain, pt did request breathing treatments x2 for feelings of shortness of breath, requested benadryl for itching. Pt remains at 4L O2 chronically. Foley in place putting out good urine. HD was completed at bedside last night. With over 5L off. BP remained stable.

## 2017-04-08 NOTE — Progress Notes (Signed)
Nutrition Follow up Note   DOCUMENTATION CODES:   Morbid obesity  INTERVENTION:   Bowel regimen as needed per MD  Nepro Shake po TID, each supplement provides 425 kcal and 19 grams protein  MVI daily  Add Rena-vite daily   Liberalize diet  NUTRITION DIAGNOSIS:   Increased nutrient needs related to chronic illness(CHF, morbid obesity) as evidenced by increased estimated needs from protein.  -new HD  GOAL:   Patient will meet greater than or equal to 90% of their needs  -not meeting  MONITOR:   PO intake, Supplement acceptance, Weight trends, TF tolerance, Skin, I & O's, Labs  ASSESSMENT:   47 y.o. female with a known history of systolic CHF, CKD IV, hypertension, diabetes, morbid obesity who was recently discharged from the hospital returns from CHF clinic due to weight gain, worsening shortness of breath.   Pt s/p LIJ cath placement and HD initiation 2/8; 2.5L fluid removed.   Spoke to RN today, pt with continued poor appetite and oral intake. Pt eating some of her meals and drinking some Nepro but likely not meeting her needs. Pt's husband bringing in some food from outside the hospital. RD will liberalize pt's diet as a heart heathy diet restricts protein. Will not allow any salt on meal trays. RD will also add Rena-vite to replace losses from HD. Per chart, pt's weight up 82lbs since admit; pt with anasarca. Pt morbidly obese and has difficulty standing for scale weights.    Medications reviewed and include: aspirin, celexa, heparin, insulin, MVI  Labs reviewed: K 4.7 wnl, Cl 86(L), BUN 79(H), creat 2.93(H), Ca 8.8(L) BNP 577(H)- 1/31 Hgb 9.1(L), Hct 30.2(L)  Diet heart healthy/carb modified Room service appropriate? Yes; Fluid consistency: Thin  EDUCATION NEEDS:   Education needs have been addressed  Skin:  Reviewed RN Assessment  Last BM:  2/12- type 4  Height:   Ht Readings from Last 1 Encounters:  03/24/17 5\' 4"  (1.626 m)    Weight:   Wt  Readings from Last 1 Encounters:  04/08/17 (!) 480 lb 9.6 oz (218 kg)    Ideal Body Weight:  54.5 kg  BMI:  Body mass index is 82.5 kg/m.  Estimated Nutritional Needs:   Kcal:  2400-2700kcal/day   Protein:  >150g/day   Fluid:  per MD  Koleen Distance MS, RD, LDN Pager #[phone] After Hours Pager: [phone]

## 2017-04-08 NOTE — Progress Notes (Signed)
Physical Therapy Treatment Patient Details Name: [name] MRN: [id number] DOB: 07/26/1970 Today's Date: 04/08/2017    History of Present Illness Pt is a6 y.o.femalewith a known history of systolic CHF, hypertension, diabetes, morbid obesity who was recently discharged from the hospital returns from CHF clinic due to weight gain, worsening shortness of breath. Attempts were made to start IV Lasix through home health but were unsuccessful. Also due to her severe shortness of breath was sent to the emergency room. Here chest x-ray shows pulmonary edema and right effusion which is chronic. Patient has been compliant with medications, fluid restriction and salt restriction.  Assessment includes: Acute on chronic diastolic congestive heart failure, acute on chronic respiratory failure with tachypnea due to CHF, HTN, DM, elevated troponin likely due to CHF, and CKD stage IV.    PT Comments    Pt agreeable for PT for bed exercises only due to having a fall earlier today. Pt reports B knee with increased pain due to falling on knees. Pt participates with supine exercises with minimal assist. Pt is demonstrating improved range with exercises this session and increased repetitions as well. Continue PT to progress strength and endurance to improve functional mobility ( ability and safety)   Follow Up Recommendations  SNF     Equipment Recommendations       Recommendations for Other Services       Precautions / Restrictions Precautions Precautions: Fall Restrictions Weight Bearing Restrictions: No    Mobility  Bed Mobility               General bed mobility comments: Not tested; pt refuses due to fall earlier.   Transfers                    Ambulation/Gait                 Stairs            Wheelchair Mobility    Modified Rankin (Stroke Patients Only)       Balance                                            Cognition  Arousal/Alertness: Lethargic Behavior During Therapy: WFL for tasks assessed/performed Overall Cognitive Status: Within Functional Limits for tasks assessed                                        Exercises General Exercises - Lower Extremity Ankle Circles/Pumps: AROM;Both;15 reps;Sidelying Quad Sets: Strengthening;Both;15 reps;Sidelying Gluteal Sets: Strengthening;Both;15 reps;Supine Short Arc Quad: AROM;Both;15 reps;Sidelying Heel Slides: AROM;Both;15 reps;Sidelying Hip ABduction/ADduction: AAROM;Left;10 reps;Sidelying Straight Leg Raises: AAROM;Both;15 reps;Sidelying    General Comments        Pertinent Vitals/Pain Pain Assessment: 0-10 Pain Score: 8  Pain Location: B knees due to fall earlier today    Home Living                      Prior Function            PT Goals (current goals can now be found in the care plan section) Progress towards PT goals: Progressing toward goals(slowly )    Frequency    Min 2X/week      PT Plan Current plan remains appropriate  Co-evaluation              AM-PAC PT "6 Clicks" Daily Activity  Outcome Measure  Difficulty turning over in bed (including adjusting bedclothes, sheets and blankets)?: Unable Difficulty moving from lying on back to sitting on the side of the bed? : Unable Difficulty sitting down on and standing up from a chair with arms (e.g., wheelchair, bedside commode, etc,.)?: A Lot Help needed moving to and from a bed to chair (including a wheelchair)?: A Lot Help needed walking in hospital room?: Total Help needed climbing 3-5 steps with a railing? : Total 6 Click Score: 8    End of Session Equipment Utilized During Treatment: Oxygen Activity Tolerance: Patient limited by fatigue;Patient limited by lethargy;Patient tolerated treatment well;Other (comment)(fall earlier) Patient left: in bed;with call bell/phone within reach;with bed alarm set;with family/visitor present   PT  Visit Diagnosis: Difficulty in walking, not elsewhere classified (R26.2);Muscle weakness (generalized) (M62.81)     Time: 7654-6503 PT Time Calculation (min) (ACUTE ONLY): 19 min  Charges:  $Therapeutic Exercise: 8-22 mins                    G Codes:  Functional Assessment Tool Used: AM-PAC 6 Clicks Basic Mobility     Larae Grooms, PTA 04/08/2017, 5:01 PM

## 2017-04-08 NOTE — Progress Notes (Signed)
Patient ID: [name], female   DOB: 02-01-1971, 47 y.o.   MRN: [id number]  This NP visited patient at the bedside as a follow up for palliative medicine needs and emotional support.  Husband and brother in law at bedside  Continued conversation regarding her multiple co-morbidites; CHF, renal disease, morbid obesity.  We discussed limitations of medical interventions to prolong quality of life and situations specific to her own.  We discussed specifically the treatment of lifeterm dialysis in her specific situation.  The patient must be able to sit in a chair for 4-6 hours to receive outpatient dialysis.  We discussed that need for dialysis will further limit her options for a skilled nursing facility.  Created space and opportunity for patient to express her feelings and thoughts regarding her very difficult situation, she speaks to "how hard it is" but remains hopeful for continued life and Improment   She cannot speak to quality at this time.  I educated and encouraged patient to consider her personal responsibility in her own care and the need for her to participate.  We discussed and I demonstrated simple stretching and AROM exercises to add to her mobility and movement    Discussed with patient the importance of continued conversation with her family and their  medical providers regarding overall plan of care and treatment options,  ensuring decisions are within the context of the patients values and GOCs.  Questions and concerns addressed      Time in  0950         Time out 1030    Total time spent on the unit was 35 minutes  Greater than 50% of the time was spent in counseling and coordination of care  Wadie Lessen NP  Palliative Medicine Team Team Phone # [phone] Pager [phone]

## 2017-04-09 LAB — GLUCOSE, CAPILLARY
GLUCOSE-CAPILLARY: 260 mg/dL — AB (ref 65–99)
GLUCOSE-CAPILLARY: 240 mg/dL — AB (ref 65–99)
Glucose-Capillary: 193 mg/dL — ABNORMAL HIGH (ref 65–99)
Glucose-Capillary: 190 mg/dL — ABNORMAL HIGH (ref 65–99)
Glucose-Capillary: 182 mg/dL — ABNORMAL HIGH (ref 65–99)

## 2017-04-09 LAB — PHOSPHORUS: PHOSPHORUS: 4.8 mg/dL — AB (ref 2.5–4.6)

## 2017-04-09 NOTE — Progress Notes (Signed)
Inpatient Diabetes Program Recommendations  AACE/ADA: New Consensus Statement on Inpatient Glycemic Control (2015)  Target Ranges:  Prepandial:   less than 140 mg/dL      Peak postprandial:   less than 180 mg/dL (1-2 hours)      Critically ill patients:  140 - 180 mg/dL  Results for [name], [name] (MRN [id number]) as of 04/09/2017 11:52  Ref. Range 04/08/2017 07:39 04/08/2017 11:57 04/08/2017 17:42 04/08/2017 20:58 04/08/2017 22:33 04/09/2017 09:35 04/09/2017 11:38  Glucose-Capillary Latest Ref Range: 65 - 99 mg/dL 150 (H) 173 (H) 228 (H) 256 (H) 220 (H) 193 (H) 190 (H)    Review of Glycemic Control  Current orders for Inpatient glycemic control: Lantus 12 units QHS, Novolog 0-15 units TID with meals, Novolog 0-5 units QHS  Inpatient Diabetes Program Recommendations: Insulin - Meal Coverage: Please consider ordering Novolog 3 units TID with meals for meal coverage if patient eats at least 50% of meals.  Thanks, Barnie Alderman, RN, MSN, CDE Diabetes Coordinator Inpatient Diabetes Program [phone] (Team Pager from 8am to 5pm)

## 2017-04-09 NOTE — Care Management (Signed)
Barrier- continues to require dialysis for fluid removal.  Patient has not sat for treatment.  At present it has not been documented that patient has end stage renal disease

## 2017-04-09 NOTE — Progress Notes (Signed)
[facility] [place] NAME: [name]    MR#:  [id number]  DATE OF BIRTH:  [birth year]  SUBJECTIVE:   Shortness of breath, leg edema and generalized weakness.  On oxygen 4 L per nasal cannula.  REVIEW OF SYSTEMS:    Review of Systems  Constitutional: Positive for malaise/fatigue. Negative for chills and fever.  HENT: Negative for congestion and tinnitus.   Eyes: Negative for blurred vision and double vision.  Respiratory: Positive for shortness of breath. Negative for cough and wheezing.   Cardiovascular: Positive for leg swelling. Negative for chest pain, orthopnea and PND.  Gastrointestinal: Negative for abdominal pain, diarrhea, nausea and vomiting.  Genitourinary: Negative for dysuria and hematuria.  Neurological: Positive for weakness. Negative for dizziness, sensory change and focal weakness.  All other systems reviewed and are negative.   Nutrition: Heart Healthy/Carb modified Tolerating Diet: Yes  DRUG ALLERGIES:   Allergies  Allergen Reactions  . Dust Mite Extract Shortness Of Breath and Itching  . Mold Extract [Trichophyton] Shortness Of Breath and Itching  . Feraheme [Ferumoxytol] Itching  . Pollen Extract   . Sulfa Antibiotics Itching    VITALS:  Blood pressure (!) 143/71, pulse 87, temperature 97.8 F (36.6 C), temperature source Oral, resp. rate 18, height 5\' 4"  (1.626 m), weight (!) 480 lb 9.6 oz (218 kg), last menstrual period 09/06/2016, SpO2 92 %.  PHYSICAL EXAMINATION:   Physical Exam  GENERAL:  47 y.o.-year-old morbidly obese patient lying in bed in no acute distress.  Morbid obesity. EYES: Pupils equal, round, reactive to light and accommodation. No scleral icterus. Extraocular muscles intact.  HEENT: Head atraumatic, normocephalic. Oropharynx and nasopharynx clear.  NECK:  Supple, no jugular venous distention. No thyroid enlargement, no tenderness.  LUNGS: Diminished breath sounds bilaterally, no  wheezing, rales, rhonchi. No use of accessory muscles of respiration.  CARDIOVASCULAR: S1, S2 normal. No murmurs, rubs, or gallops.  ABDOMEN: Soft, nontender, Protuberant, distended. Bowel sounds present. No organomegaly or mass. + Anasarca with ascites.  EXTREMITIES: No cyanosis, clubbing, +2 edema b/l  NEUROLOGIC: Cranial nerves II through XII are intact. No focal Motor or sensory deficits b/l. Globally weak.    PSYCHIATRIC: The patient is alert and oriented x 3.  SKIN: No obvious rash, lesion, or ulcer.   Left IJ Dialysis access in place.   LABORATORY PANEL:   CBC Recent Labs  Lab 04/08/17 0535  WBC 6.3  HGB 9.1*  HCT 30.2*  PLT 403   ------------------------------------------------------------------------------------------------------------------  Chemistries  Recent Labs  Lab 04/05/17 0529  NA 137  K 4.7  CL 86*  CO2 38*  GLUCOSE 188*  BUN 79*  CREATININE 2.93*  CALCIUM 8.8*   ------------------------------------------------------------------------------------------------------------------  Cardiac Enzymes No results for input(s): TROPONINI in the last 168 hours. ------------------------------------------------------------------------------------------------------------------  RADIOLOGY:  No results found.   ASSESSMENT AND PLAN:   47 year old female with morbid obesity, chronic diastolic heart failure, COPD, chronic kidney disease stage III, who presented with shortness of breath.  1. Acute on chronic hypoxic respiratory failure due to acute CHF exacerbation -continue O2 supplementation, continue dialysis for fluid removal. Patient is clinically stable but remains volume overloaded. Weight has down since admission.  47 year old female with morbid obesity, chronic diastolic heart failure, COPD, chronic kidney disease stage III, who presented with shortness of breath.  2. Acute on chronic diastolic heart failure with anasarca: -despite getting IV diuretics patient continues to be anasarcic Started on hemodialysis and cont.  Hemodialysis today per Dr. Holley Raring. - cont.  Metoprolol.  - Weight has come down since  admission bed weights (180-169 kg's)  3. Diabetes: Continue Lantus with sliding scale and ADA diet - BS STable.   4. Essential hypertension: cont. Metoprolol   5. Hyperlipidemia: Continue atorvastatin  6. Depression: Continue Celexa  7. Acute on Chronic kidney disease stage III: pt. Has progressed to CKD Stage V - now started on HD.  The patient needs more hemodialysis in the hospital per Dr. Holley Raring.  I discussed with Dr. Holley Raring. All the records are reviewed and case discussed with Care Management/Social Worker. Management plans discussed with the patient, family and they are in agreement.  CODE STATUS: Full Code  DVT Prophylaxis: Hep. SQ  TOTAL TIME TAKING CARE OF THIS PATIENT: 30 minutes.   POSSIBLE D/C IN 3-5 DAYS, DEPENDING ON CLINICAL CONDITION.   Demetrios Loll M.D on 04/09/2017 at 2:39 PM  Between 7am to 6pm - Pager - [phone]  After 6pm go to [email or web address] - [employer] [facility]  Office  [phone]  CC: Primary care physician; [name], NP

## 2017-04-09 NOTE — Progress Notes (Signed)
Physical Therapy Treatment Patient Details Name: [name] MRN: [id number] DOB: 1970-10-01 Today's Date: 04/09/2017    History of Present Illness Pt is a46 y.o.femalewith a known history of systolic CHF, hypertension, diabetes, morbid obesity who was recently discharged from the hospital returns from CHF clinic due to weight gain, worsening shortness of breath. Attempts were made to start IV Lasix through home health but were unsuccessful. Also due to her severe shortness of breath was sent to the emergency room. Here chest x-ray shows pulmonary edema and right effusion which is chronic. Patient has been compliant with medications, fluid restriction and salt restriction.  Assessment includes: Acute on chronic diastolic congestive heart failure, acute on chronic respiratory failure with tachypnea due to CHF, HTN, DM, elevated troponin likely due to CHF, and CKD stage IV.    PT Comments    Pt agreeable to PT; reports 6/10 pain in B knees due to fall yesterday. Pt demonstrating much progress today with getting to edge of bed with 1 person Max assist and with encouragement use of bilateral upper extremities to assist self. Pt did well. Sat edge of bed x 30 min with intermittent exercises with lower extremities, weight shifting R/L and tolerance of more midline sitting posture for several minutes at a time. Pt tendency is to lean R with support. With some fatigue, pillows used latter in sit for support. Continue PT to progress strength, endurance to improve functional mobility.    Follow Up Recommendations  SNF     Equipment Recommendations       Recommendations for Other Services       Precautions / Restrictions Precautions Precautions: Fall Restrictions Weight Bearing Restrictions: No    Mobility  Bed Mobility Overal bed mobility: Needs Assistance Bed Mobility: Supine to Sit;Sit to Supine     Supine to sit: Max assist;HOB elevated(sheet threaded at foot of bed for pt to  pull from)        Transfers                    Ambulation/Gait                 Stairs            Wheelchair Mobility    Modified Rankin (Stroke Patients Only)       Balance Overall balance assessment: Needs assistance Sitting-balance support: Feet supported;Single extremity supported;Bilateral upper extremity supported Sitting balance-Leahy Scale: Fair                                      Cognition Arousal/Alertness: Awake/alert(fatigued) Behavior During Therapy: WFL for tasks assessed/performed Overall Cognitive Status: Within Functional Limits for tasks assessed                                        Exercises General Exercises - Lower Extremity Ankle Circles/Pumps: AROM;15 reps;Seated;Both(2 srts) Long Arc Quad: AROM;Both;10 reps;Seated(2 sets) Hip Flexion/Marching: AROM;Strengthening;Both;10 reps;Seated(2 sets limited range) Other Exercises Other Exercises: sitting tolerance; shifting wt R/L x 30 min    General Comments        Pertinent Vitals/Pain Pain Assessment: 0-10 Pain Score: 6  Pain Location: B knees Pain Intervention(s): Monitored during session    Home Living  Prior Function            PT Goals (current goals can now be found in the care plan section) Progress towards PT goals: Progressing toward goals    Frequency    Min 2X/week      PT Plan Current plan remains appropriate    Co-evaluation              AM-PAC PT "6 Clicks" Daily Activity  Outcome Measure  Difficulty turning over in bed (including adjusting bedclothes, sheets and blankets)?: Unable Difficulty moving from lying on back to sitting on the side of the bed? : Unable Difficulty sitting down on and standing up from a chair with arms (e.g., wheelchair, bedside commode, etc,.)?: Unable Help needed moving to and from a bed to chair (including a wheelchair)?: A Lot Help needed  walking in hospital room?: A Lot Help needed climbing 3-5 steps with a railing? : A Lot 6 Click Score: 9    End of Session Equipment Utilized During Treatment: Oxygen Activity Tolerance: Patient tolerated treatment well Patient left: in bed;with call bell/phone within reach;with bed alarm set   PT Visit Diagnosis: Difficulty in walking, not elsewhere classified (R26.2);Muscle weakness (generalized) (M62.81)     Time: 9242-6834 PT Time Calculation (min) (ACUTE ONLY): 49 min  Charges:  $Therapeutic Exercise: 8-22 mins $Therapeutic Activity: 23-37 mins                    G CodesLarae Grooms, PTA 04/09/2017, 12:59 PM

## 2017-04-09 NOTE — Plan of Care (Signed)
  Progressing Clinical Measurements: Diagnostic test results will improve 04/09/2017 0246 - Progressing by Loran Senters, RN Pain Managment: General experience of comfort will improve 04/09/2017 0246 - Progressing by Loran Senters, RN

## 2017-04-09 NOTE — Progress Notes (Signed)
Offered pt bed bath this afternoon and she declined.

## 2017-04-09 NOTE — Progress Notes (Signed)
[facility] [place], [place] 04/09/17  Subjective:  Continues to be quite weak. She still has some ongoing shortness of breath. Patient due for dialysis today and target ultrafiltration is 5 kg.   Objective:  Vital signs in last 24 hours:  Temp:  [97.8 F (36.6 C)-98.3 F (36.8 C)] 97.8 F (36.6 C) (02/13 1020) Pulse Rate:  [78-87] 87 (02/13 1208) Resp:  [17-18] 18 (02/13 1020) BP: (123-144)/(71-80) 143/71 (02/13 1020) SpO2:  [92 %-100 %] 92 % (02/13 1020) Weight:  [218 kg (480 lb 9.6 oz)] 218 kg (480 lb 9.6 oz) (02/13 0423)  Weight change: -5.17 kg (-6.4 oz) Filed Weights   04/07/17 2128 04/08/17 0437 04/09/17 0423  Weight: (!) 219 kg (482 lb 12.9 oz) (!) 218 kg (480 lb 9.6 oz) (!) 218 kg (480 lb 9.6 oz)    Intake/Output:   No intake or output data in the 24 hours ending 04/09/17 1617   Physical Exam: General: Laying in bed, NAD  HEENT Normocephalic and atraumatic.  Neck supple  Pulm/lungs Bilateral rales, normal effort  CVS/Heart RRR no obvious rub  Abdomen:  Distension noted, BS present  Extremities: 3+ b/l lower extremity pitting edema  Neurologic: Alert, oriented, able to answer questions  Skin: Venous stasis changes noted b/l lower extremities          Basic Metabolic Panel:  Recent Labs  Lab 04/03/17 0708 04/04/17 0417 04/05/17 0529  NA 138 135 137  K 4.6 4.8 4.7  CL 87* 87* 86*  CO2 40* 39* 38*  GLUCOSE 173* 210* 188*  BUN 70* 73* 79*  CREATININE 3.00* 3.02* 2.93*  CALCIUM 8.5* 8.2* 8.8*     CBC: Recent Labs  Lab 04/08/17 0535  WBC 6.3  HGB 9.1*  HCT 30.2*  MCV 86.0  PLT 403      Lab Results  Component Value Date   HEPBSAG Negative 04/04/2017      Microbiology:  No results found for this or any previous visit (from the past 240 hour(s)).  Coagulation Studies: No results for input(s): LABPROT, INR in the last 72 hours.  Urinalysis: No results for input(s): COLORURINE, LABSPEC, PHURINE, GLUCOSEU,  HGBUR, BILIRUBINUR, KETONESUR, PROTEINUR, UROBILINOGEN, NITRITE, LEUKOCYTESUR in the last 72 hours.  Invalid input(s): APPERANCEUR    Imaging: No results found.   Medications:   . sodium chloride     . aspirin EC  81 mg Oral Daily  . atorvastatin  80 mg Oral q1800  . citalopram  40 mg Oral Daily  . feeding supplement (NEPRO CARB STEADY)  237 mL Oral TID WC  . heparin  5,000 Units Subcutaneous Q8H  . hydrocerin   Topical BID  . insulin aspart  0-15 Units Subcutaneous TID WC  . insulin aspart  0-5 Units Subcutaneous QHS  . insulin glargine  12 Units Subcutaneous QHS  . metoprolol tartrate  50 mg Oral BID  . multivitamin  1 tablet Oral QHS  . multivitamin with minerals  1 tablet Oral Daily  . sodium chloride flush  3 mL Intravenous Q12H   sodium chloride, acetaminophen **OR** acetaminophen, cyclobenzaprine, diphenhydrAMINE, ipratropium-albuterol, ondansetron **OR** ondansetron (ZOFRAN) IV, polyethylene glycol, sodium chloride, sodium chloride flush  Assessment/ Plan:  47 y.o. African American morbidly obese female with h/o diastolic congestive heart failure, chronic kidney disease stage 3-4, HTN, DM, iron deficiency anemia, medication and treatment noncompliance admitted following appointment at the [facility] when she presented with diffuse edema and pain most notable in b/l lower extremities  and abdomen.   1. Acute renal failure on chronic kidney disease stage 3   - Baseline Cr 1.57 (12/2016)/ GFR 45 -  Admit Cr 2.65--> 2.78 --> 3.0 (2/7).  - 24 hr urine Creatinine clearance is 16 cc/min (04/04/2017) -Patient scheduled for hemodialysis today with ultrafiltration target of 5 kg.  We will consider ongoing daily dialysis for now.  2. Acute on chronic diastolic congestive heart failure (grade 1 diastolic dysfunction) with [place] 2018 Cardiac studies: Normal LVEF with mild to moderate MR and grade 1 diastolic dysfunction. Mildly dilated LV with EF ~55% and normal wall motion.   -As above we will plan for ultrafiltration today.  3. DM-2 with CKD - A1C 13.2 (11/2014) --> 7.3 (02/2017) -It appears that her A1c recently has improved.  Continue current efforts at glycemic control.   LOS: [place] 2/13/20194:17 PM  [place], [place]

## 2017-04-10 DIAGNOSIS — F4323 Adjustment disorder with mixed anxiety and depressed mood: Secondary | ICD-10-CM

## 2017-04-10 LAB — BASIC METABOLIC PANEL
Anion gap: 9 (ref 5–15)
BUN: 58 mg/dL — AB (ref 6–20)
CALCIUM: 8.6 mg/dL — AB (ref 8.9–10.3)
CHLORIDE: 94 mmol/L — AB (ref 101–111)
CO2: 35 mmol/L — AB (ref 22–32)
CREATININE: 3 mg/dL — AB (ref 0.44–1.00)
GFR calc non Af Amer: 18 mL/min — ABNORMAL LOW (ref >60)
GFR, EST AFRICAN AMERICAN: 21 mL/min — AB (ref >60)
Glucose, Bld: 215 mg/dL — ABNORMAL HIGH (ref 65–99)
Potassium: 4.4 mmol/L (ref 3.5–5.1)
SODIUM: 138 mmol/L (ref 135–145)

## 2017-04-10 LAB — GLUCOSE, CAPILLARY
GLUCOSE-CAPILLARY: 188 mg/dL — AB (ref 65–99)
Glucose-Capillary: 204 mg/dL — ABNORMAL HIGH (ref 65–99)
Glucose-Capillary: 150 mg/dL — ABNORMAL HIGH (ref 65–99)
Glucose-Capillary: 123 mg/dL — ABNORMAL HIGH (ref 65–99)

## 2017-04-10 MED ORDER — LEVALBUTEROL HCL 0.63 MG/3ML IN NEBU
0.6300 mg | INHALATION_SOLUTION | Freq: Once | RESPIRATORY_TRACT | Status: AC
  Administered 2017-04-10: 0.63 mg via RESPIRATORY_TRACT
  Filled 2017-04-10: qty 3

## 2017-04-10 MED ORDER — LORAZEPAM 2 MG/ML IJ SOLN
1.0000 mg | Freq: Once | INTRAMUSCULAR | Status: AC
  Administered 2017-04-10: 1 mg via INTRAVENOUS
  Filled 2017-04-10: qty 1

## 2017-04-10 MED ORDER — INSULIN ASPART 100 UNIT/ML ~~LOC~~ SOLN
3.0000 [IU] | Freq: Three times a day (TID) | SUBCUTANEOUS | Status: DC
  Administered 2017-04-11 – 2017-04-28 (×33): 3 [IU] via SUBCUTANEOUS
  Filled 2017-04-10 (×33): qty 1

## 2017-04-10 NOTE — Progress Notes (Signed)
[place], [place] 04/10/17  Subjective:  It appears that the patient is not having a significant amount of urine output. We finished dialysis early this a.m. and 5 kg of ultrafiltration was achieved. It appears at the moment that the patient is dialysis dependent. Her weight is coming down and is currently 212 kg or 468 pounds.   Objective:  Vital signs in last 24 hours:  Temp:  [97.7 F (36.5 C)-98.4 F (36.9 C)] 98.4 F (36.9 C) (02/14 0735) Pulse Rate:  [85-104] 102 (02/14 0916) Resp:  [16-19] 19 (02/14 0735) BP: (105-159)/(53-86) 111/55 (02/14 0735) SpO2:  [92 %-95 %] 95 % (02/13 2002) Weight:  [212.7 kg (468 lb 14.7 oz)-218 kg (480 lb 9.6 oz)] 212.7 kg (468 lb 14.7 oz) (02/14 0400)  Weight change: 0 kg (0 lb) Filed Weights   04/09/17 0423 04/10/17 0030 04/10/17 0400  Weight: (!) 218 kg (480 lb 9.6 oz) (!) 218 kg (480 lb 9.6 oz) (!) 212.7 kg (468 lb 14.7 oz)    Intake/Output:    Intake/Output Summary (Last 24 hours) at 04/10/2017 1152 Last data filed at 04/10/2017 0400 Gross per 24 hour  Intake -  Output 5049 ml  Net -5049 ml     Physical Exam: General: Laying in bed, NAD  HEENT Normocephalic and atraumatic.  Neck supple  Pulm/lungs Bilateral rales, normal effort  CVS/Heart RRR no obvious rub  Abdomen:  Distension noted, BS present  Extremities: 2+ b/l lower extremity pitting edema  Neurologic: Alert, oriented, able to answer questions  Skin: Venous stasis changes noted b/l lower extremities          Basic Metabolic Panel:  Recent Labs  Lab 04/04/17 0417 04/05/17 0529 04/09/17 2240 04/10/17 0547  NA 135 137  --  138  K 4.8 4.7  --  4.4  CL 87* 86*  --  94*  CO2 39* 38*  --  35*  GLUCOSE 210* 188*  --  215*  BUN 73* 79*  --  58*  CREATININE 3.02* 2.93*  --  3.00*  CALCIUM 8.2* 8.8*  --  8.6*  PHOS  --   --  4.8*  --      CBC: Recent Labs  Lab 04/08/17 0535  WBC 6.3  HGB 9.1*  HCT 30.2*  MCV 86.0  PLT 403       Lab Results  Component Value Date   HEPBSAG Negative 04/04/2017      Microbiology:  No results found for this or any previous visit (from the past 240 hour(s)).  Coagulation Studies: No results for input(s): LABPROT, INR in the last 72 hours.  Urinalysis: No results for input(s): COLORURINE, LABSPEC, PHURINE, GLUCOSEU, HGBUR, BILIRUBINUR, KETONESUR, PROTEINUR, UROBILINOGEN, NITRITE, LEUKOCYTESUR in the last 72 hours.  Invalid input(s): APPERANCEUR    Imaging: No results found.   Medications:   . sodium chloride     . aspirin EC  81 mg Oral Daily  . atorvastatin  80 mg Oral q1800  . citalopram  40 mg Oral Daily  . feeding supplement (NEPRO CARB STEADY)  237 mL Oral TID WC  . heparin  5,000 Units Subcutaneous Q8H  . hydrocerin   Topical BID  . insulin aspart  0-15 Units Subcutaneous TID WC  . insulin aspart  0-5 Units Subcutaneous QHS  . insulin glargine  12 Units Subcutaneous QHS  . metoprolol tartrate  50 mg Oral BID  . multivitamin  1 tablet Oral QHS  . multivitamin with minerals  1 tablet Oral Daily  . sodium chloride flush  3 mL Intravenous Q12H   sodium chloride, acetaminophen **OR** acetaminophen, cyclobenzaprine, diphenhydrAMINE, ipratropium-albuterol, ondansetron **OR** ondansetron (ZOFRAN) IV, polyethylene glycol, sodium chloride, sodium chloride flush  Assessment/ Plan:  47 y.o. African American morbidly obese female with h/o diastolic congestive heart failure, chronic kidney disease stage 3-4, HTN, DM, iron deficiency anemia, medication and treatment noncompliance admitted following appointment at the [facility] when she presented with diffuse edema and pain most notable in b/l lower extremities and abdomen.   1. Acute renal failure on chronic kidney disease stage 3   - Baseline Cr 1.57 (12/2016)/ GFR 45 -  Admit Cr 2.65--> 2.78 --> 3.0 (2/7).  - 24 hr urine Creatinine clearance is 16 cc/min (04/04/2017) -Significant volume removal has  been performed this week.  Her weight is down to 212 kg.  We will hold off on dialysis today and plan for dialysis again tomorrow.  Overall however it appears that she is most likely dialysis dependent.  We will consider declaring the patient end-stage renal disease.  2. Acute on chronic diastolic congestive heart failure (grade 1 diastolic dysfunction) with [place] 2018 Cardiac studies: Normal LVEF with mild to moderate MR and grade 1 diastolic dysfunction. Mildly dilated LV with EF ~55% and normal wall motion.  -Patient has done well with ultrafiltration thus far.  As above her weight is down to 212 kg at the moment.  Continue ultrafiltration efforts.  3. DM-2 with CKD - A1C 13.2 (11/2014) --> 7.3 (02/2017) -It appears that her A1c recently has improved.  Continue current efforts at glycemic control.   LOS: [place] 2/14/201911:52 AM  [facility] [place], [place]

## 2017-04-10 NOTE — Progress Notes (Signed)
[name] at [place] NAME: [name]    MR#:  [id number]  DATE OF BIRTH:  [birth year]  SUBJECTIVE:   Shortness of breath, leg edema and generalized weakness.  On oxygen 4 L per nasal cannula.  Still oliguric.  REVIEW OF SYSTEMS:    Review of Systems  Constitutional: Positive for malaise/fatigue. Negative for chills and fever.  HENT: Negative for congestion and tinnitus.   Eyes: Negative for blurred vision and double vision.  Respiratory: Positive for shortness of breath. Negative for cough and wheezing.   Cardiovascular: Positive for leg swelling. Negative for chest pain, orthopnea and PND.  Gastrointestinal: Negative for abdominal pain, diarrhea, nausea and vomiting.  Genitourinary: Negative for dysuria and hematuria.  Neurological: Positive for weakness. Negative for dizziness, sensory change and focal weakness.  All other systems reviewed and are negative.   Nutrition: Heart Healthy/Carb modified Tolerating Diet: Yes  DRUG ALLERGIES:   Allergies  Allergen Reactions  . Dust Mite Extract Shortness Of Breath and Itching  . Mold Extract [Trichophyton] Shortness Of Breath and Itching  . Feraheme [Ferumoxytol] Itching  . Pollen Extract   . Sulfa Antibiotics Itching    VITALS:  Blood pressure (!) 111/55, pulse (!) 102, temperature 98.4 F (36.9 C), resp. rate 19, height 5\' 4"  (1.626 m), weight (!) 468 lb 14.7 oz (212.7 kg), last menstrual period 09/06/2016, SpO2 97 %.  PHYSICAL EXAMINATION:   Physical Exam  GENERAL:  47 y.o.-year-old morbidly obese patient lying in bed in no acute distress.  Morbid obesity. EYES: Pupils equal, round, reactive to light and accommodation. No scleral icterus. Extraocular muscles intact.  HEENT: Head atraumatic, normocephalic. Oropharynx and nasopharynx clear.  NECK:  Supple, no jugular venous distention. No thyroid enlargement, no tenderness.  LUNGS: Diminished breath sounds bilaterally, no  wheezing, rales, rhonchi. No use of accessory muscles of respiration.  CARDIOVASCULAR: S1, S2 normal. No murmurs, rubs, or gallops.  ABDOMEN: Soft, nontender, Protuberant, distended. Bowel sounds present. + Anasarca. EXTREMITIES: No cyanosis, clubbing, +2 edema b/l  NEUROLOGIC: Cranial nerves II through XII are intact. No focal Motor or sensory deficits b/l. Globally weak.    PSYCHIATRIC: The patient is alert and oriented x 3.  SKIN: No obvious rash, lesion, or ulcer.  Chronic skin changes.  Left IJ Dialysis access in place.   LABORATORY PANEL:   CBC Recent Labs  Lab 04/08/17 0535  WBC 6.3  HGB 9.1*  HCT 30.2*  PLT 403   ------------------------------------------------------------------------------------------------------------------  Chemistries  Recent Labs  Lab 04/10/17 0547  NA 138  K 4.4  CL 94*  CO2 35*  GLUCOSE 215*  BUN 58*  CREATININE 3.00*  CALCIUM 8.6*   ------------------------------------------------------------------------------------------------------------------  Cardiac Enzymes No results for input(s): TROPONINI in the last 168 hours. ------------------------------------------------------------------------------------------------------------------  RADIOLOGY:  No results found.   ASSESSMENT AND PLAN:   47 year old female with morbid obesity, chronic diastolic heart failure, COPD, chronic kidney disease stage III, who presented with shortness of breath.  1. Acute on chronic hypoxic respiratory failure due to acute CHF exacerbation -continue O2 supplementation, continue dialysis for fluid removal. Patient is clinically stable but remains volume overloaded. Weight has down since admission.  She is on her baseline oxygen at 4 L  2. Acute on chronic diastolic heart failure with anasarca: -despite getting IV diuretics patient continues to be anasarcic Started on hemodialysis and cont.  Hemodialysis today per Dr. Holley Raring. - cont. Metoprolol.   3.  Diabetes: Continue Lantus with sliding scale and  ADA diet - BS STable.   4. Essential hypertension: cont. Metoprolol   5. Hyperlipidemia: Continue atorvastatin  6. Depression: Continue Celexa  7. Acute on Chronic kidney disease stage III: pt. Has progressed to CKD Stage V - now started on HD.  The patient needs more hemodialysis in the hospital per Dr. Holley Raring.  I discussed with Dr. Holley Raring. All the records are reviewed and case discussed with Care Management/Social Worker. Management plans discussed with the patient, her husband and they are in agreement.  CODE STATUS: Full Code  DVT Prophylaxis: Hep. SQ  TOTAL TIME TAKING CARE OF THIS PATIENT: 42 minutes.   POSSIBLE D/C IN 3-4 DAYS, DEPENDING ON CLINICAL CONDITION.   Demetrios Loll M.D on 04/10/2017 at 3:22 PM  Between 7am to 6pm - Pager - [phone]  After 6pm go to [email or web address] - [employer] [facility]  Office  [phone]  CC: Primary care physician; [name], NP

## 2017-04-10 NOTE — Progress Notes (Signed)
Inpatient Diabetes Program Recommendations  AACE/ADA: New Consensus Statement on Inpatient Glycemic Control (2015)  Target Ranges:  Prepandial:   less than 140 mg/dL      Peak postprandial:   less than 180 mg/dL (1-2 hours)      Critically ill patients:  140 - 180 mg/dL   Lab Results  Component Value Date   GLUCAP 204 (H) 04/10/2017   HGBA1C 7.3 (H) 03/05/2017    Review of Glycemic Control  Results for [name], [name] (MRN [id number]) as of 04/10/2017 10:44  Ref. Range 04/09/2017 11:38 04/09/2017 16:35 04/09/2017 20:46 04/09/2017 23:05 04/10/2017 07:32  Glucose-Capillary Latest Ref Range: 65 - 99 mg/dL 190 (H) 182 (H) 260 (H) 240 (H) 204 (H)   Diabetes history:Type 2  Outpatient Diabetes medications:Novolog 2-5 units tid with meals , Lantus 30-35 units qhs (as reported by patient)  Current orders for Inpatient glycemic control: Lantus 12 units QHS, Novolog 0-15 units TID with meals, Novolog 0-5 units QHS  Inpatient Diabetes Program Recommendations: Please consider ordering Novolog 3 units TID with meals for meal coverage if patient eats at least 50% of meals.  Please consider increasing Lantus to 21 units qhs (0.1unit/kg).  Gentry Fitz, RN, BA, MHA, CDE Diabetes Coordinator Inpatient Diabetes Program  [phone] (Team Pager) [phone] ([place]) 04/10/2017 10:46 AM

## 2017-04-10 NOTE — Progress Notes (Signed)
HD tx ended 

## 2017-04-10 NOTE — Progress Notes (Signed)
Pre HD Tx  

## 2017-04-10 NOTE — Care Management (Signed)
Spoke with attending and nephrology.  Patient is not sitting for her sessions at present. Patient is going to continue to require dialysis for acute renal failure.  Her medicaid will not pay for outpatient dialysis if not stated as esrd. If patient is diagnosed with esrd, and pursues chronic dialysis, she will have to be able to transport to the dialysis clinic and sit for the full duration of treatment. Nephrologist stated due to size, patient's sessions could be 5 hours long.  This would mean patient would have to sit for approximately 7 straight hours on dialysis days. CM contacted [facility] for [facility] referral then remember that the program does not provide services for those patient less than 8 years of age.  Discussed with the unit director the need for patient to sit for dialysis and use hoyer.

## 2017-04-10 NOTE — Progress Notes (Signed)
Pre HD Assessment  

## 2017-04-10 NOTE — Plan of Care (Signed)
Non compliance

## 2017-04-10 NOTE — Progress Notes (Signed)
Patient ID: [name], female   DOB: 07/29/1970, 47 y.o.   MRN: [id number]  This NP visited patient at the bedside as a follow up for palliative medicine needs and emotional support.  Husband  at bedside.  Patient is lethargic and less engaged today in conversation. She denies feelings of sadness or depression.  I took this opportunity to speak directly with spouse  regarding her multiple co-morbidites; CHF, renal disease, morbid obesity.  We discussed limitations of medical interventions to prolong quality of life and in this specific situation  She is high risk to decompensate.  We discussed the importance of discussing and documenting  ADs specifically  to Code Status.  Discussed with patient the importance of continued conversation with her husband and their  medical providers regarding overall plan of care and treatment options,  ensuring decisions are within the context of the patients values and GOCs.  Questions and concerns addressed   I shared with them that I would not be in this facility for many weeks but that I would have another palliative provider check in with them for palliative medicine needs and emotional support.   Left team phone number as contact.  Time in 1400          Time out  1435   Total time spent on the unit was 35 minutes  Greater than 50% of the time was spent in counseling and coordination of care  Wadie Lessen NP  Palliative Medicine Team Team Phone # [phone] Pager [phone]

## 2017-04-10 NOTE — Progress Notes (Signed)
Post HD Tx  

## 2017-04-11 DIAGNOSIS — N186 End stage renal disease: Secondary | ICD-10-CM

## 2017-04-11 LAB — GLUCOSE, CAPILLARY
Glucose-Capillary: 159 mg/dL — ABNORMAL HIGH (ref 65–99)
Glucose-Capillary: 108 mg/dL — ABNORMAL HIGH (ref 65–99)
Glucose-Capillary: 106 mg/dL — ABNORMAL HIGH (ref 65–99)
Glucose-Capillary: 191 mg/dL — ABNORMAL HIGH (ref 65–99)

## 2017-04-11 LAB — PHOSPHORUS: Phosphorus: 5 mg/dL — ABNORMAL HIGH (ref 2.5–4.6)

## 2017-04-11 MED ORDER — LORAZEPAM 2 MG/ML IJ SOLN
1.0000 mg | Freq: Once | INTRAMUSCULAR | Status: AC
  Administered 2017-04-11: 1 mg via INTRAVENOUS
  Filled 2017-04-11: qty 1

## 2017-04-11 NOTE — Progress Notes (Signed)
HD started. 

## 2017-04-11 NOTE — Progress Notes (Signed)
[place], [place] 04/11/17  Subjective:  Patient completed hemodialysis today. Ultrafiltration and she was 3 kg. System clotted early.  Objective:  Vital signs in last 24 hours:  Temp:  [97.7 F (36.5 C)-99.5 F (37.5 C)] 98.3 F (36.8 C) (02/15 1040) Pulse Rate:  [70-83] 72 (02/15 1430) Resp:  [15-19] 19 (02/15 0908) BP: (99-143)/(50-100) 118/72 (02/15 1430) SpO2:  [90 %-100 %] 96 % (02/15 1430) Weight:  [211.4 kg (466 lb)-213.3 kg (470 lb 3.9 oz)] 213.3 kg (470 lb 3.9 oz) (02/15 1040)  Weight change: -6.624 kg (-9.6 oz) Filed Weights   04/10/17 0400 04/11/17 0421 04/11/17 1040  Weight: (!) 212.7 kg (468 lb 14.7 oz) (!) 211.4 kg (466 lb) (!) 213.3 kg (470 lb 3.9 oz)    Intake/Output:    Intake/Output Summary (Last 24 hours) at 04/11/2017 1528 Last data filed at 04/10/2017 2027 Gross per 24 hour  Intake 120 ml  Output -  Net 120 ml     Physical Exam: General: Laying in bed, NAD  HEENT Normocephalic and atraumatic.  Neck supple  Pulm/lungs Bilateral rales, normal effort  CVS/Heart RRR no obvious rub  Abdomen:  Distension noted, BS present  Extremities: 2+ b/l lower extremity pitting edema  Neurologic: Alert, oriented, able to answer questions  Skin: Venous stasis changes noted b/l lower extremities          Basic Metabolic Panel:  Recent Labs  Lab 04/05/17 0529 04/09/17 2240 04/10/17 0547 04/11/17 0907  NA 137  --  138  --   K 4.7  --  4.4  --   CL 86*  --  94*  --   CO2 38*  --  35*  --   GLUCOSE 188*  --  215*  --   BUN 79*  --  58*  --   CREATININE 2.93*  --  3.00*  --   CALCIUM 8.8*  --  8.6*  --   PHOS  --  4.8*  --  5.0*     CBC: Recent Labs  Lab 04/08/17 0535  WBC 6.3  HGB 9.1*  HCT 30.2*  MCV 86.0  PLT 403      Lab Results  Component Value Date   HEPBSAG Negative 04/04/2017      Microbiology:  No results found for this or any previous visit (from the past 240 hour(s)).  Coagulation  Studies: No results for input(s): LABPROT, INR in the last 72 hours.  Urinalysis: No results for input(s): COLORURINE, LABSPEC, PHURINE, GLUCOSEU, HGBUR, BILIRUBINUR, KETONESUR, PROTEINUR, UROBILINOGEN, NITRITE, LEUKOCYTESUR in the last 72 hours.  Invalid input(s): APPERANCEUR    Imaging: No results found.   Medications:   . sodium chloride     . aspirin EC  81 mg Oral Daily  . atorvastatin  80 mg Oral q1800  . citalopram  40 mg Oral Daily  . feeding supplement (NEPRO CARB STEADY)  237 mL Oral TID WC  . heparin  5,000 Units Subcutaneous Q8H  . hydrocerin   Topical BID  . insulin aspart  0-15 Units Subcutaneous TID WC  . insulin aspart  0-5 Units Subcutaneous QHS  . insulin aspart  3 Units Subcutaneous TID WC  . insulin glargine  12 Units Subcutaneous QHS  . metoprolol tartrate  50 mg Oral BID  . multivitamin  1 tablet Oral QHS  . multivitamin with minerals  1 tablet Oral Daily  . sodium chloride flush  3 mL Intravenous Q12H   sodium chloride,  acetaminophen **OR** acetaminophen, cyclobenzaprine, diphenhydrAMINE, ipratropium-albuterol, ondansetron **OR** ondansetron (ZOFRAN) IV, polyethylene glycol, sodium chloride, sodium chloride flush  Assessment/ Plan:  47 y.o. African American morbidly obese female with h/o diastolic congestive heart failure, chronic kidney disease stage 3-4, HTN, DM, iron deficiency anemia, medication and treatment noncompliance admitted following appointment at the [facility] when she presented with diffuse edema and pain most notable in b/l lower extremities and abdomen.   1. Acute renal failure on chronic kidney disease stage 3   - Baseline Cr 1.57 (12/2016)/ GFR 45 -  Admit Cr 2.65--> 2.78 --> 3.0 (2/7).  - 24 hr urine Creatinine clearance is 16 cc/min (04/04/2017) - patient completed hemodialysis today.  Ultrafiltration and she lost 3 kg.  We will reassess the patient for dialysis again on Monday.  Suspect that the patient may be approaching  end-stage renal disease.  2. Acute on chronic diastolic congestive heart failure (grade 1 diastolic dysfunction) with [place] 2018 Cardiac studies: Normal LVEF with mild to moderate MR and grade 1 diastolic dysfunction. Mildly dilated LV with EF ~55% and normal wall motion.  -ultrafiltration achieved today was 3 kg.  Plan for dialysis again on Monday.  3. DM-2 with CKD - A1C 13.2 (11/2014) --> 7.3 (02/2017) -It appears that her A1c recently has improved.  Blood glucose management as per hospitalist.  LOS: 18 Rorik Vespa 2/15/20193:28 PM  [facility] [place], [place]

## 2017-04-11 NOTE — Progress Notes (Signed)
Docotr Pyreddy called and ordered a one time dose of Ativan 1 mg IV. Will continue to monitor.

## 2017-04-11 NOTE — Care Management (Signed)
Discussed during progression the need to have patient out of bed several times a day.  Director has spoken with patient and family about the need to be out of the bed for extended  periods of time and enabling behaviors.  Patient declined to get out of bed when staff attempted.  Patient has voiced that she is going to a facility.  Updated CSW and informed staff that patient declined the facility.  Reinstructed patient she has to sit for dialysis.

## 2017-04-11 NOTE — Progress Notes (Signed)
[place] [facility] [place] NAME: [name]    MR#:  [id number]  DATE OF BIRTH:  03-27-1970  SUBJECTIVE:   Better shortness of breath, leg edema and generalized weakness.  On oxygen 4 L per nasal cannula.  No urine output today.  REVIEW OF SYSTEMS:    Review of Systems  Constitutional: Positive for malaise/fatigue. Negative for chills and fever.  HENT: Negative for congestion and tinnitus.   Eyes: Negative for blurred vision and double vision.  Respiratory: Positive for shortness of breath. Negative for cough and wheezing.   Cardiovascular: Positive for leg swelling. Negative for chest pain, orthopnea and PND.  Gastrointestinal: Negative for abdominal pain, diarrhea, nausea and vomiting.  Genitourinary: Negative for dysuria and hematuria.  Neurological: Positive for weakness. Negative for dizziness, sensory change and focal weakness.  All other systems reviewed and are negative.   Nutrition: Heart Healthy/Carb modified Tolerating Diet: Yes  DRUG ALLERGIES:   Allergies  Allergen Reactions  . Dust Mite Extract Shortness Of Breath and Itching  . Mold Extract [Trichophyton] Shortness Of Breath and Itching  . Feraheme [Ferumoxytol] Itching  . Pollen Extract   . Sulfa Antibiotics Itching    VITALS:  Blood pressure 118/72, pulse 72, temperature 98.3 F (36.8 C), temperature source Axillary, resp. rate 19, height 5\' 4"  (1.626 m), weight (!) 470 lb 3.9 oz (213.3 kg), last menstrual period 09/06/2016, SpO2 96 %.  PHYSICAL EXAMINATION:   Physical Exam  GENERAL:  47 y.o.-year-old morbidly obese patient lying in bed in no acute distress.  Morbid obesity. EYES: Pupils equal, round, reactive to light and accommodation. No scleral icterus. Extraocular muscles intact.  HEENT: Head atraumatic, normocephalic. Oropharynx and nasopharynx clear.  NECK:  Supple, no jugular venous distention. No thyroid enlargement, no tenderness.  LUNGS: Diminished  breath sounds bilaterally, no wheezing, rales, rhonchi. No use of accessory muscles of respiration.  CARDIOVASCULAR: S1, S2 normal. No murmurs, rubs, or gallops.  ABDOMEN: Soft, nontender, Protuberant, distended. Bowel sounds present. + Anasarca. EXTREMITIES: No cyanosis, clubbing, +2 edema b/l  NEUROLOGIC: Cranial nerves II through XII are intact. No focal Motor or sensory deficits b/l. Globally weak.    PSYCHIATRIC: The patient is alert and oriented x 3.  SKIN: No obvious rash, lesion, or ulcer.  Chronic skin changes.  Left IJ Dialysis access in place.   LABORATORY PANEL:   CBC Recent Labs  Lab 04/08/17 0535  WBC 6.3  HGB 9.1*  HCT 30.2*  PLT 403   ------------------------------------------------------------------------------------------------------------------  Chemistries  Recent Labs  Lab 04/10/17 0547  NA 138  K 4.4  CL 94*  CO2 35*  GLUCOSE 215*  BUN 58*  CREATININE 3.00*  CALCIUM 8.6*   ------------------------------------------------------------------------------------------------------------------  Cardiac Enzymes No results for input(s): TROPONINI in the last 168 hours. ------------------------------------------------------------------------------------------------------------------  RADIOLOGY:  No results found.   ASSESSMENT AND PLAN:   47 year old female with morbid obesity, chronic diastolic heart failure, COPD, chronic kidney disease stage III, who presented with shortness of breath.  1. Acute on chronic hypoxic respiratory failure due to acute CHF exacerbation -continue O2 supplementation, continue dialysis for fluid removal. Patient is clinically stable but remains volume overloaded. Weight has down since admission.  She is on her baseline oxygen at 4 L  2. Acute on chronic diastolic heart failure with anasarca: -despite getting IV diuretics patient continues to be anasarcic Started on hemodialysis and cont.  Hemodialysis today per Dr.  Holley Raring. - cont. Metoprolol.   3. Diabetes: Continue Lantus  with sliding scale and ADA diet - BS STable.   4. Essential hypertension: cont. Metoprolol   5. Hyperlipidemia: Continue atorvastatin  6. Depression: Continue Celexa  7. Acute on Chronic kidney disease stage III: pt. Has progressed to CKD Stage V - now started on HD.  The patient needs more hemodialysis in the hospital per Dr. Holley Raring.  I discussed with Dr. Holley Raring. All the records are reviewed and case discussed with Care Management/Social Worker. Management plans discussed with the patient, her husband and they are in agreement.  CODE STATUS: Full Code  DVT Prophylaxis: Hep. SQ  TOTAL TIME TAKING CARE OF THIS PATIENT: 28 minutes.   POSSIBLE D/C IN 3-4 DAYS, DEPENDING ON CLINICAL CONDITION.   Demetrios Loll M.D on 04/11/2017 at 3:43 PM  Between 7am to 6pm - Pager - [phone]  After 6pm go to [email or web address] - Technical brewer [facility]  Office  [phone]  CC: Primary care physician; [name], NP

## 2017-04-11 NOTE — Plan of Care (Signed)
  Progressing Clinical Measurements: Will remain free from infection 04/11/2017 0003 - Progressing by Liliane Channel, RN Pain Managment: General experience of comfort will improve 04/11/2017 0003 - Progressing by Liliane Channel, RN Safety: Ability to remain free from injury will improve 04/11/2017 0003 - Progressing by Liliane Channel, RN Skin Integrity: Risk for impaired skin integrity will decrease 04/11/2017 0003 - Progressing by Liliane Channel, RN

## 2017-04-11 NOTE — Progress Notes (Signed)
Pre HD  

## 2017-04-11 NOTE — Progress Notes (Signed)
Pt is requesting medicine for agitation. Page prime. Awaiting call back. Will continue to monitor.

## 2017-04-11 NOTE — Progress Notes (Signed)
Patient asked if it was OK if staff assisted her into room chair at this time. Patient refuses, states "maybe later." Will continue to monitor. [name] [facility]

## 2017-04-11 NOTE — Progress Notes (Signed)
HD complete 

## 2017-04-11 NOTE — Plan of Care (Signed)
  Clinical Measurements: Ability to maintain clinical measurements within normal limits will improve 04/11/2017 1448 - Not Progressing by Daron Offer, RN Note Hemoglobin today = only 9.1. Will continue to monitor labs. Wenda Low [facility]

## 2017-04-12 LAB — GLUCOSE, CAPILLARY
GLUCOSE-CAPILLARY: 200 mg/dL — AB (ref 65–99)
GLUCOSE-CAPILLARY: 173 mg/dL — AB (ref 65–99)
GLUCOSE-CAPILLARY: 137 mg/dL — AB (ref 65–99)
Glucose-Capillary: 230 mg/dL — ABNORMAL HIGH (ref 65–99)

## 2017-04-12 LAB — CBC
HCT: 27.1 % — ABNORMAL LOW (ref 35.0–47.0)
Hemoglobin: 8.1 g/dL — ABNORMAL LOW (ref 12.0–16.0)
MCH: 26.3 pg (ref 26.0–34.0)
MCHC: 29.9 g/dL — ABNORMAL LOW (ref 32.0–36.0)
MCV: 87.9 fL (ref 80.0–100.0)
PLATELETS: 347 10*3/uL (ref 150–440)
RBC: 3.08 MIL/uL — AB (ref 3.80–5.20)
RDW: 16.5 % — AB (ref 11.5–14.5)
WBC: 7.1 10*3/uL (ref 3.6–11.0)

## 2017-04-12 MED ORDER — SALINE SPRAY 0.65 % NA SOLN
2.0000 | Freq: Four times a day (QID) | NASAL | Status: DC
  Administered 2017-04-12 – 2017-04-28 (×30): 2 via NASAL
  Filled 2017-04-12: qty 44

## 2017-04-12 MED ORDER — LACTULOSE 10 GM/15ML PO SOLN
20.0000 g | Freq: Every day | ORAL | Status: DC | PRN
  Administered 2017-04-12 – 2017-04-26 (×5): 20 g via ORAL
  Filled 2017-04-12 (×6): qty 30

## 2017-04-12 MED ORDER — POLYETHYLENE GLYCOL 3350 17 G PO PACK
17.0000 g | PACK | Freq: Every day | ORAL | Status: DC
  Administered 2017-04-13 – 2017-04-25 (×6): 17 g via ORAL
  Filled 2017-04-12 (×10): qty 1

## 2017-04-12 NOTE — Progress Notes (Signed)
Patient ID: [name], female   DOB: 17-Jul-1970, 47 y.o.   MRN: [id number]  [employer] PROGRESS NOTE  CECILIE HEIDEL AQT:[id number] DOB: March 26, 1970 DOA: 03/24/2017 PCP: [name], NP  HPI/Subjective: Patient was asking for her nebulizer because she does have cough shortness of breath and wheezing.  She does complain of some constipation.  Objective: Vitals:   04/12/17 0429 04/12/17 0801  BP: 119/66   Pulse: 80   Resp: 16   Temp: 98 F (36.7 C)   SpO2: 90% 97%    Filed Weights   04/11/17 0421 04/11/17 1040 04/12/17 0429  Weight: (!) 211.4 kg (466 lb) (!) 213.3 kg (470 lb 3.9 oz) (!) 200 kg (440 lb 14.7 oz)    ROS: Review of Systems  Constitutional: Negative for chills and fever.  Eyes: Negative for blurred vision.  Respiratory: Positive for cough, shortness of breath and wheezing.   Cardiovascular: Negative for chest pain.  Gastrointestinal: Positive for constipation. Negative for abdominal pain, diarrhea, nausea and vomiting.  Genitourinary: Negative for dysuria.  Musculoskeletal: Negative for joint pain.  Neurological: Negative for dizziness and headaches.   Exam: Physical Exam  Constitutional: She is oriented to person, place, and time.  HENT:  Nose: No mucosal edema.  Mouth/Throat: No oropharyngeal exudate or posterior oropharyngeal edema.  Eyes: Conjunctivae, EOM and lids are normal. Pupils are equal, round, and reactive to light.  Neck: No JVD present. Carotid bruit is not present. No edema present. No thyroid mass and no thyromegaly present.  Cardiovascular: S1 normal and S2 normal. Exam reveals no gallop.  No murmur heard. Pulses:      Dorsalis pedis pulses are 2+ on the right side, and 2+ on the left side.  Respiratory: No respiratory distress. She has decreased breath sounds in the right lower field and the left lower field. She has no wheezes. She has no rhonchi. She has no rales.  GI: Soft. Bowel sounds are normal. There is no tenderness.   Musculoskeletal:       Right ankle: She exhibits swelling.       Left ankle: She exhibits swelling.  Lymphadenopathy:    She has no cervical adenopathy.  Neurological: She is alert and oriented to person, place, and time. No cranial nerve deficit.  Skin: Skin is warm. No rash noted. Nails show no clubbing.  Psychiatric: She has a normal mood and affect.      Data Reviewed: Basic Metabolic Panel: Recent Labs  Lab 04/09/17 2240 04/10/17 0547 04/11/17 0907  NA  --  138  --   K  --  4.4  --   CL  --  94*  --   CO2  --  35*  --   GLUCOSE  --  215*  --   BUN  --  58*  --   CREATININE  --  3.00*  --   CALCIUM  --  8.6*  --   PHOS 4.8*  --  5.0*   CBC: Recent Labs  Lab 04/08/17 0535 04/12/17 0459  WBC 6.3 7.1  HGB 9.1* 8.1*  HCT 30.2* 27.1*  MCV 86.0 87.9  PLT 403 347   BNP (last 3 results) Recent Labs    01/07/17 1039 03/03/17 1114 03/27/17 0702  BNP 456.0* 617.0* 577.0*    CBG: Recent Labs  Lab 04/11/17 1225 04/11/17 1746 04/11/17 2111 04/12/17 0952 04/12/17 1237  GLUCAP 108* 106* 191* 230* 200*    Scheduled Meds: . aspirin EC  81 mg  Oral Daily  . atorvastatin  80 mg Oral q1800  . citalopram  40 mg Oral Daily  . feeding supplement (NEPRO CARB STEADY)  237 mL Oral TID WC  . heparin  5,000 Units Subcutaneous Q8H  . hydrocerin   Topical BID  . insulin aspart  0-15 Units Subcutaneous TID WC  . insulin aspart  0-5 Units Subcutaneous QHS  . insulin aspart  3 Units Subcutaneous TID WC  . insulin glargine  12 Units Subcutaneous QHS  . metoprolol tartrate  50 mg Oral BID  . multivitamin  1 tablet Oral QHS  . multivitamin with minerals  1 tablet Oral Daily  . [START ON 04/13/2017] polyethylene glycol  17 g Oral Daily  . sodium chloride  2 spray Each Nare QID  . sodium chloride flush  3 mL Intravenous Q12H   Continuous Infusions: . sodium chloride      Assessment/Plan:  1. Acute on chronic hypoxic respiratory failure secondary to acute CHF  exacerbation.  Patient currently on 3 L of oxygen. 2. Acute on chronic diastolic congestive heart failure.  Dialysis to help out with fluid management.  Continue metoprolol 3. Acute kidney injury on chronic kidney disease stage III.  Nephrology has not yet declared her end-stage.  Dialysis currently needed for fluid management. 4. Morbid obesity.  Likely has sleep apnea underlying also.  Recommend outpatient sleep study 5. Constipation start MiraLAX 6. Essential hypertension on metoprolol 7. Hyperlipidemia unspecified on atorvastatin 8. Depression on Celexa 9. Type 2 diabetes mellitus on Lantus and sliding scale  Code Status:     Code Status Orders  (From admission, onward)        Start     Ordered   03/24/17 1951  Full code  Continuous     03/24/17 1952    Code Status History    Date Active Date Inactive Code Status Order ID Comments User Context   03/03/2017 14:20 03/10/2017 21:31 Full Code [id number]  Fritzi Mandes, MD ED   01/07/2017 17:25 01/10/2017 19:00 Full Code [id number]  Demetrios Loll, MD Inpatient   11/08/2016 04:50 11/16/2016 17:17 Full Code [id number]  Harvie Bridge, DO Inpatient   06/25/2016 14:47 06/29/2016 18:15 Full Code [id number]  Hillary Bow, MD ED   08/09/2015 17:57 08/13/2015 17:21 Full Code [id number]  Bettey Costa, MD Inpatient   12/31/2014 20:25 01/03/2015 17:49 Full Code [id number]  Lytle Butte, MD ED   12/15/2014 11:54 12/19/2014 16:57 Full Code [id number]  Hillary Bow, MD ED     Family Communication: Husband at bedside Disposition Plan: Will need outpatient dialysis slot.  28 minutesTime spent:   [employer]

## 2017-04-12 NOTE — Plan of Care (Signed)
  Health Behavior/Discharge Planning: Ability to manage health-related needs will improve 04/12/2017 1426 - Not Progressing by Daron Offer, RN Note Patient still refusing P.T. today, or to even attempt to get O.B.B. with staff member(s). Will continue to monitor for improvement. [name] [facility]

## 2017-04-12 NOTE — Progress Notes (Signed)
PT Cancellation Note  Patient Details Name: [name] MRN: [id number] DOB: 03/06/1970   Cancelled Treatment:    Reason Eval/Treat Not Completed: Patient declined, no reason specified   Returned for session after pt finished breathing treatment.  Pt refused session stating she wanted to get some rest.  Stated she had been up to edge of bed x 2 today.  Stated she has not been out of bed in chair.  Reviewed importance of mobility and need to get up in chair for dialysis treatments for discharge. Pt generally dismissive of therapy and refused intervention.  Husband in room.  Education for importance of mobility risks/benefits.  Will continue as appropriate.   Chesley Noon 04/12/2017, 11:46 AM

## 2017-04-12 NOTE — Progress Notes (Signed)
[place], [place] 04/12/17  Subjective:  The patient's weight is significantly down at the moment at 200 kg. Therefore significant ultrafiltration has been performed this week. She remains dialysis dependent however.  Objective:  Vital signs in last 24 hours:  Temp:  [98 F (36.7 C)-98.6 F (37 C)] 98 F (36.7 C) (02/16 0429) Pulse Rate:  [76-80] 80 (02/16 0429) Resp:  [16-18] 16 (02/16 0429) BP: (112-119)/(57-66) 119/66 (02/16 0429) SpO2:  [90 %-97 %] 97 % (02/16 0801) Weight:  [200 kg (440 lb 14.7 oz)] 200 kg (440 lb 14.7 oz) (02/16 0429)  Weight change: 1.924 kg (4 lb 3.9 oz) Filed Weights   04/11/17 0421 04/11/17 1040 04/12/17 0429  Weight: (!) 211.4 kg (466 lb) (!) 213.3 kg (470 lb 3.9 oz) (!) 200 kg (440 lb 14.7 oz)    Intake/Output:    Intake/Output Summary (Last 24 hours) at 04/12/2017 1807 Last data filed at 04/12/2017 0430 Gross per 24 hour  Intake -  Output 200 ml  Net -200 ml     Physical Exam: General: Laying in bed, NAD  HEENT Normocephalic and atraumatic.  Neck supple  Pulm/lungs Bilateral rales, normal effort  CVS/Heart RRR no obvious rub  Abdomen:  Distension noted, BS present  Extremities: 2+ b/l lower extremity edema  Neurologic: Alert, oriented, able to answer questions  Skin: Venous stasis changes noted b/l lower extremities          Basic Metabolic Panel:  Recent Labs  Lab 04/09/17 2240 04/10/17 0547 04/11/17 0907  NA  --  138  --   K  --  4.4  --   CL  --  94*  --   CO2  --  35*  --   GLUCOSE  --  215*  --   BUN  --  58*  --   CREATININE  --  3.00*  --   CALCIUM  --  8.6*  --   PHOS 4.8*  --  5.0*     CBC: Recent Labs  Lab 04/08/17 0535 04/12/17 0459  WBC 6.3 7.1  HGB 9.1* 8.1*  HCT 30.2* 27.1*  MCV 86.0 87.9  PLT 403 347      Lab Results  Component Value Date   HEPBSAG Negative 04/04/2017      Microbiology:  No results found for this or any previous visit (from the past  240 hour(s)).  Coagulation Studies: No results for input(s): LABPROT, INR in the last 72 hours.  Urinalysis: No results for input(s): COLORURINE, LABSPEC, PHURINE, GLUCOSEU, HGBUR, BILIRUBINUR, KETONESUR, PROTEINUR, UROBILINOGEN, NITRITE, LEUKOCYTESUR in the last 72 hours.  Invalid input(s): APPERANCEUR    Imaging: No results found.   Medications:   . sodium chloride     . aspirin EC  81 mg Oral Daily  . atorvastatin  80 mg Oral q1800  . citalopram  40 mg Oral Daily  . feeding supplement (NEPRO CARB STEADY)  237 mL Oral TID WC  . heparin  5,000 Units Subcutaneous Q8H  . hydrocerin   Topical BID  . insulin aspart  0-15 Units Subcutaneous TID WC  . insulin aspart  0-5 Units Subcutaneous QHS  . insulin aspart  3 Units Subcutaneous TID WC  . insulin glargine  12 Units Subcutaneous QHS  . metoprolol tartrate  50 mg Oral BID  . multivitamin  1 tablet Oral QHS  . multivitamin with minerals  1 tablet Oral Daily  . [START ON 04/13/2017] polyethylene glycol  17 g  Oral Daily  . sodium chloride  2 spray Each Nare QID  . sodium chloride flush  3 mL Intravenous Q12H   sodium chloride, acetaminophen **OR** acetaminophen, cyclobenzaprine, diphenhydrAMINE, ipratropium-albuterol, lactulose, ondansetron **OR** ondansetron (ZOFRAN) IV, sodium chloride flush  Assessment/ Plan:  47 y.o. African American morbidly obese female with h/o diastolic congestive heart failure, chronic kidney disease stage 3-4, HTN, DM, iron deficiency anemia, medication and treatment noncompliance admitted following appointment at the [facility] when she presented with diffuse edema and pain most notable in b/l lower extremities and abdomen.   1. Acute renal failure on chronic kidney disease stage 3   - Baseline Cr 1.57 (12/2016)/ GFR 45 -  Admit Cr 2.65--> 2.78 --> 3.0 (2/7).  - 24 hr urine Creatinine clearance is 16 cc/min (04/04/2017) -Patient continues to have very poor urine output.  Urine output was only  200 mL over the preceding 24 hours.  However we will perform significant ultrafiltration this week as her weight is down to 200 kg.  Next dialysis for Monday.  2. Acute on chronic diastolic congestive heart failure (grade 1 diastolic dysfunction) with [place] 2018 Cardiac studies: Normal LVEF with mild to moderate MR and grade 1 diastolic dysfunction. Mildly dilated LV with EF ~55% and normal wall motion.  -As above significant ultrafiltration performed.  We down to 200 kg.  Continue ultrafiltration with dialysis.  3. DM-2 with CKD - A1C 13.2 (11/2014) --> 7.3 (02/2017) -Blood glucose management as per hospitalist.  LOS: [place], Breely Panik 2/16/20196:07 PM  [place], [place]

## 2017-04-12 NOTE — Progress Notes (Signed)
The importance of increased mobility and getting at least to the room chair multiple times a shift were stressed to both the patient and myself yesterday throughout dayshift. Patient refused PT again today, see their note regarding education and goal setting efforts. Will continue to encourage patient to set mobility goals to get O.O.B. Wenda Low [facility]

## 2017-04-13 LAB — GLUCOSE, CAPILLARY
GLUCOSE-CAPILLARY: 85 mg/dL (ref 65–99)
GLUCOSE-CAPILLARY: 145 mg/dL — AB (ref 65–99)
Glucose-Capillary: 130 mg/dL — ABNORMAL HIGH (ref 65–99)
Glucose-Capillary: 130 mg/dL — ABNORMAL HIGH (ref 65–99)

## 2017-04-13 MED ORDER — FLEET ENEMA 7-19 GM/118ML RE ENEM
1.0000 | ENEMA | Freq: Once | RECTAL | Status: AC
  Administered 2017-04-13: 1 via RECTAL

## 2017-04-13 NOTE — Plan of Care (Signed)
Pt is A&Ox4. VSS . 4L O2 [place] . NSR on monitor. Contact isolation precautions maintained. Husband at bedside. Foley catheter d/c this shift per order.  Enema given this shift per order, pt had BM after enema, but requested to be disimpacted.  On exam by this RN , rectum was free of stool, no signs of disimpaction on exam.  Pt reassured. Will continue to monitor and report to oncoming RN .  Progressing Health Behavior/Discharge Planning: Ability to manage health-related needs will improve 04/13/2017 1530 - Progressing by Aleen Campi, RN Clinical Measurements: Ability to maintain clinical measurements within normal limits will improve 04/13/2017 1530 - Progressing by Aleen Campi, RN Will remain free from infection 04/13/2017 1530 - Progressing by Aleen Campi, RN Diagnostic test results will improve 04/13/2017 1530 - Progressing by Aleen Campi, RN Respiratory complications will improve 04/13/2017 1530 - Progressing by Aleen Campi, RN Cardiovascular complication will be avoided 04/13/2017 1530 - Progressing by Aleen Campi, RN Activity: Risk for activity intolerance will decrease 04/13/2017 1530 - Progressing by Aleen Campi, RN Nutrition: Adequate nutrition will be maintained 04/13/2017 1530 - Progressing by Aleen Campi, RN Coping: Level of anxiety will decrease 04/13/2017 1530 - Progressing by Aleen Campi, RN Elimination: Will not experience complications related to bowel motility 04/13/2017 1530 - Progressing by Aleen Campi, RN Will not experience complications related to urinary retention 04/13/2017 1530 - Progressing by Aleen Campi, RN Pain Managment: General experience of comfort will improve 04/13/2017 1530 - Progressing by Aleen Campi, RN Safety: Ability to remain free from injury will improve 04/13/2017 1530 - Progressing by Aleen Campi, RN Skin Integrity: Risk for impaired skin integrity will decrease 04/13/2017 1530 - Progressing by Aleen Campi,  RN Education: Ability to demonstrate management of disease process will improve 04/13/2017 1530 - Progressing by Aleen Campi, RN Ability to verbalize understanding of medication therapies will improve 04/13/2017 1530 - Progressing by Aleen Campi, RN Activity: Capacity to carry out activities will improve 04/13/2017 1530 - Progressing by Aleen Campi, RN Cardiac: Ability to achieve and maintain adequate cardiopulmonary perfusion will improve 04/13/2017 1530 - Progressing by Aleen Campi, RN Spiritual Needs Ability to function at adequate level 04/13/2017 1530 - Progressing by Aleen Campi, RN

## 2017-04-13 NOTE — Progress Notes (Signed)
Patient ID: [name], female   DOB: November 25, 1970, 47 y.o.   MRN: [id number]  [facility] PROGRESS NOTE  [name]:[id number] DOB: Apr 03, 1970 DOA: 03/24/2017 PCP: [name], NP  HPI/Subjective: Patient complains of constipation.  Patient complains of weakness and difficulty moving around.  Feels okay with regards to her breathing.  Objective: Vitals:   04/13/17 0845 04/13/17 0916  BP:  129/71  Pulse:  81  Resp:  18  Temp:  98 F (36.7 C)  SpO2: 96%     Filed Weights   04/11/17 1040 04/12/17 0429 04/13/17 0505  Weight: (!) 213.3 kg (470 lb 3.9 oz) (!) 200 kg (440 lb 14.7 oz) (!) 205 kg (451 lb 15.1 oz)    ROS: Review of Systems  Constitutional: Negative for chills and fever.  Eyes: Negative for blurred vision.  Respiratory: Positive for shortness of breath. Negative for cough.   Cardiovascular: Negative for chest pain.  Gastrointestinal: Positive for constipation. Negative for abdominal pain, diarrhea, nausea and vomiting.  Genitourinary: Negative for dysuria.  Musculoskeletal: Negative for joint pain.  Neurological: Negative for dizziness and headaches.   Exam: Physical Exam  Constitutional: She is oriented to person, place, and time.  HENT:  Nose: No mucosal edema.  Mouth/Throat: No oropharyngeal exudate or posterior oropharyngeal edema.  Eyes: Conjunctivae, EOM and lids are normal. Pupils are equal, round, and reactive to light.  Neck: No JVD present. Carotid bruit is not present. No edema present. No thyroid mass and no thyromegaly present.  Cardiovascular: S1 normal and S2 normal. Exam reveals no gallop.  No murmur heard. Pulses:      Dorsalis pedis pulses are 2+ on the right side, and 2+ on the left side.  Respiratory: No respiratory distress. She has decreased breath sounds in the right lower field and the left lower field. She has no wheezes. She has no rhonchi. She has no rales.  GI: Soft. Bowel sounds are normal. She exhibits distension.  There is no tenderness.  Musculoskeletal:       Right ankle: She exhibits swelling.       Left ankle: She exhibits swelling.  Lymphadenopathy:    She has no cervical adenopathy.  Neurological: She is alert and oriented to person, place, and time. No cranial nerve deficit.  Skin: Skin is warm. No rash noted. Nails show no clubbing.  Psychiatric: She has a normal mood and affect.      Data Reviewed: Basic Metabolic Panel: Recent Labs  Lab 04/09/17 2240 04/10/17 0547 04/11/17 0907  NA  --  138  --   K  --  4.4  --   CL  --  94*  --   CO2  --  35*  --   GLUCOSE  --  215*  --   BUN  --  58*  --   CREATININE  --  3.00*  --   CALCIUM  --  8.6*  --   PHOS 4.8*  --  5.0*   CBC: Recent Labs  Lab 04/08/17 0535 04/12/17 0459  WBC 6.3 7.1  HGB 9.1* 8.1*  HCT 30.2* 27.1*  MCV 86.0 87.9  PLT 403 347   BNP (last 3 results) Recent Labs    01/07/17 1039 03/03/17 1114 03/27/17 0702  BNP 456.0* 617.0* 577.0*     CBG: Recent Labs  Lab 04/12/17 1237 04/12/17 1715 04/12/17 2105 04/13/17 0749 04/13/17 1213  GLUCAP 200* 173* 137* 85 145*     Scheduled Meds: .  aspirin EC  81 mg Oral Daily  . atorvastatin  80 mg Oral q1800  . citalopram  40 mg Oral Daily  . feeding supplement (NEPRO CARB STEADY)  237 mL Oral TID WC  . heparin  5,000 Units Subcutaneous Q8H  . hydrocerin   Topical BID  . insulin aspart  0-15 Units Subcutaneous TID WC  . insulin aspart  0-5 Units Subcutaneous QHS  . insulin aspart  3 Units Subcutaneous TID WC  . insulin glargine  12 Units Subcutaneous QHS  . metoprolol tartrate  50 mg Oral BID  . multivitamin  1 tablet Oral QHS  . multivitamin with minerals  1 tablet Oral Daily  . polyethylene glycol  17 g Oral Daily  . sodium chloride  2 spray Each Nare QID  . sodium chloride flush  3 mL Intravenous Q12H   Continuous Infusions: . sodium chloride      Assessment/Plan:  1. Acute on chronic hypoxic respiratory failure secondary to acute CHF  exacerbation.  Patient currently on 3 L of oxygen. 2. Acute on chronic diastolic congestive heart failure.  Dialysis to help out with fluid management.  Continue metoprolol 3. Acute kidney injury on chronic kidney disease stage III.  Nephrology has not yet declared her end-stage.  Dialysis currently needed for fluid management.  Patient requesting for Foley to be removed.  Can use external Foley catheter to wall suction 4. Morbid obesity.  Likely has sleep apnea underlying also.  Recommend outpatient sleep study. 5. Constipation start MiraLAX.  Patient requested Fleet enema 6. Essential hypertension on metoprolol 7. Hyperlipidemia unspecified on atorvastatin 8. Depression on Celexa 9. Type 2 diabetes mellitus on Lantus and sliding scale    Code Status:     Code Status Orders  (From admission, onward)        Start     Ordered   03/24/17 1951  Full code  Continuous     03/24/17 1952    Code Status History    Date Active Date Inactive Code Status Order ID Comments User Context   03/03/2017 14:20 03/10/2017 21:31 Full Code [id number]  Fritzi Mandes, MD ED   01/07/2017 17:25 01/10/2017 19:00 Full Code [id number]  Demetrios Loll, MD Inpatient   11/08/2016 04:50 11/16/2016 17:17 Full Code [id number]  Harvie Bridge, DO Inpatient   06/25/2016 14:47 06/29/2016 18:15 Full Code [id number]  Hillary Bow, MD ED   08/09/2015 17:57 08/13/2015 17:21 Full Code [id number]  Bettey Costa, MD Inpatient   12/31/2014 20:25 01/03/2015 17:49 Full Code [id number]  Lytle Butte, MD ED   12/15/2014 11:54 12/19/2014 16:57 Full Code [id number]  Hillary Bow, MD ED     Family Communication: Husband at bedside Disposition Plan: We will need to walk with physical therapy, will need to sit up with dialysis, will need outpatient dialysis slot, will also likely need PermCath insertion  Consultants:  Nephrology  Time spent: 26 minutes  [place]

## 2017-04-13 NOTE — Progress Notes (Signed)
[facility], [place] 04/13/17  Subjective:  Unfortunately patient's renal function continues to be quite low. Urine output was only 100 cc over the preceding 24 hours. Next dialysis treatment scheduled for tomorrow.  Objective:  Vital signs in last 24 hours:  Temp:  [98 F (36.7 C)-98.3 F (36.8 C)] 98 F (36.7 C) (02/17 0916) Pulse Rate:  [71-84] 81 (02/17 0916) Resp:  [18] 18 (02/17 0916) BP: (128-133)/(69-73) 129/71 (02/17 0916) SpO2:  [93 %-98 %] 96 % (02/17 0845) Weight:  [205 kg (451 lb 15.1 oz)] 205 kg (451 lb 15.1 oz) (02/17 0505)  Weight change: -8.3 kg (-4.8 oz) Filed Weights   04/11/17 1040 04/12/17 0429 04/13/17 0505  Weight: (!) 213.3 kg (470 lb 3.9 oz) (!) 200 kg (440 lb 14.7 oz) (!) 205 kg (451 lb 15.1 oz)    Intake/Output:    Intake/Output Summary (Last 24 hours) at 04/13/2017 1326 Last data filed at 04/13/2017 0509 Gross per 24 hour  Intake -  Output 100 ml  Net -100 ml     Physical Exam: General: Laying in bed, NAD  HEENT Normocephalic and atraumatic.  Neck supple  Pulm/lungs Bilateral rales, normal effort  CVS/Heart RRR no obvious rub  Abdomen:  BS present, distension noted  Extremities: 2+ b/l lower extremity edema  Neurologic: Alert, oriented, able to answer questions  Skin: Venous stasis changes noted b/l lower extremities  Access:  L IJ temporary dialysis catheter       Basic Metabolic Panel:  Recent Labs  Lab 04/09/17 2240 04/10/17 0547 04/11/17 0907  NA  --  138  --   K  --  4.4  --   CL  --  94*  --   CO2  --  35*  --   GLUCOSE  --  215*  --   BUN  --  58*  --   CREATININE  --  3.00*  --   CALCIUM  --  8.6*  --   PHOS 4.8*  --  5.0*     CBC: Recent Labs  Lab 04/08/17 0535 04/12/17 0459  WBC 6.3 7.1  HGB 9.1* 8.1*  HCT 30.2* 27.1*  MCV 86.0 87.9  PLT 403 347      Lab Results  Component Value Date   HEPBSAG Negative 04/04/2017      Microbiology:  No results found for this or  any previous visit (from the past 240 hour(s)).  Coagulation Studies: No results for input(s): LABPROT, INR in the last 72 hours.  Urinalysis: No results for input(s): COLORURINE, LABSPEC, PHURINE, GLUCOSEU, HGBUR, BILIRUBINUR, KETONESUR, PROTEINUR, UROBILINOGEN, NITRITE, LEUKOCYTESUR in the last 72 hours.  Invalid input(s): APPERANCEUR    Imaging: No results found.   Medications:   . sodium chloride     . aspirin EC  81 mg Oral Daily  . atorvastatin  80 mg Oral q1800  . citalopram  40 mg Oral Daily  . feeding supplement (NEPRO CARB STEADY)  237 mL Oral TID WC  . heparin  5,000 Units Subcutaneous Q8H  . hydrocerin   Topical BID  . insulin aspart  0-15 Units Subcutaneous TID WC  . insulin aspart  0-5 Units Subcutaneous QHS  . insulin aspart  3 Units Subcutaneous TID WC  . insulin glargine  12 Units Subcutaneous QHS  . metoprolol tartrate  50 mg Oral BID  . multivitamin  1 tablet Oral QHS  . multivitamin with minerals  1 tablet Oral Daily  . polyethylene glycol  17 g Oral Daily  . sodium chloride  2 spray Each Nare QID  . sodium chloride flush  3 mL Intravenous Q12H   sodium chloride, acetaminophen **OR** acetaminophen, cyclobenzaprine, diphenhydrAMINE, ipratropium-albuterol, lactulose, ondansetron **OR** ondansetron (ZOFRAN) IV, sodium chloride flush  Assessment/ Plan:  47 y.o. African American morbidly obese female with h/o diastolic congestive heart failure, chronic kidney disease stage 3-4, HTN, DM, iron deficiency anemia, medication and treatment noncompliance admitted following appointment at the [facility] when she presented with diffuse edema and pain most notable in b/l lower extremities and abdomen.   1. Acute renal failure on chronic kidney disease stage 3   - Baseline Cr 1.57 (12/2016)/ GFR 45 -  Admit Cr 2.65--> 2.78 --> 3.0 (2/7).  - 24 hr urine Creatinine clearance is 16 cc/min (04/04/2017) -She has very little urine output has been was only measured  as being 100 cc over the preceding 24 hours.  She would likely need ongoing dialysis.  She may become dialysis dependent and we will consider diagnosis of end-stage renal disease next week.  She still has a temporary internal jugular dialysis catheter in place.  We will likely need to consult with vascular surgery to see if she is a candidate for PermCath.  Approximately 30 pounds of fluid have been removed this past week.  Consider frequent dialysis treatments next week.  2. Acute on chronic diastolic congestive heart failure (grade 1 diastolic dysfunction) with [place] 2018 Cardiac studies: Normal LVEF with mild to moderate MR and grade 1 diastolic dysfunction. Mildly dilated LV with EF ~55% and normal wall motion.  -Continue to perform perform hemodialysis with ultrafiltration.  As above significant volume removal was performed last week.  3. DM-2 with CKD - A1C 13.2 (11/2014) --> 7.3 (02/2017) -Blood glucose management as per hospitalist.  LOS: [place], Wirt Hemmerich 2/17/20191:26 PM  [facility] [place], [place]

## 2017-04-13 NOTE — Progress Notes (Signed)
Pt is requesting a medication to help move bowels. She does have PRN lactulose but only schedule daily. [facility] [place]. Awaiting callback. Will continue to monitor.

## 2017-04-13 NOTE — Progress Notes (Signed)
OR schedule is full tomorrow.  Patient will be scheduled for [name] on Tuesday.  [name]

## 2017-04-14 ENCOUNTER — Inpatient Hospital Stay: Payer: Medicaid Other

## 2017-04-14 LAB — GLUCOSE, CAPILLARY
GLUCOSE-CAPILLARY: 157 mg/dL — AB (ref 65–99)
Glucose-Capillary: 177 mg/dL — ABNORMAL HIGH (ref 65–99)
Glucose-Capillary: 181 mg/dL — ABNORMAL HIGH (ref 65–99)

## 2017-04-14 IMAGING — DX DG CHEST 1V PORT
1 series · 2 of 2 positions shown · non-contrast
Comparison: [DATE] and earlier.

CLINICAL DATA: 46-year-old female with shortness of breath.

EXAM:
PORTABLE CHEST 1 VIEW

[Series 1: chest ap · 0.14mm/px · 2 of 2 slices shown]
[im 1/2]
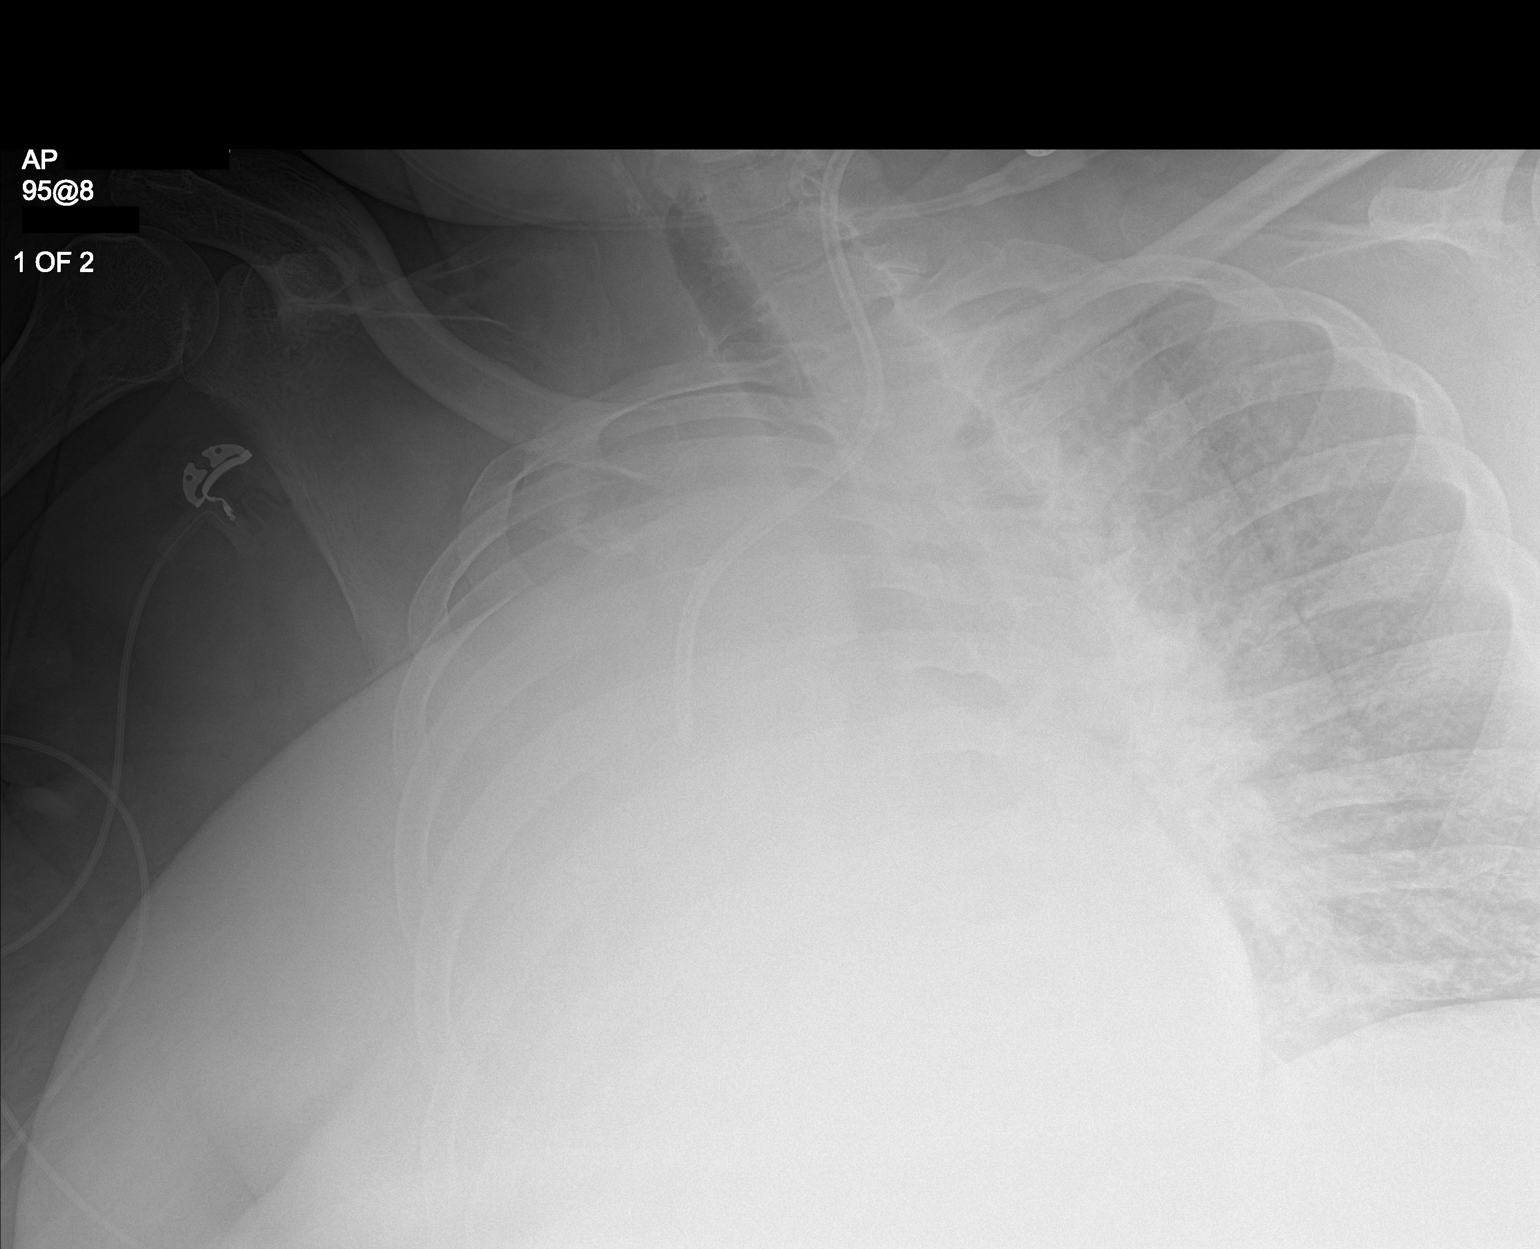
[im 2/2]
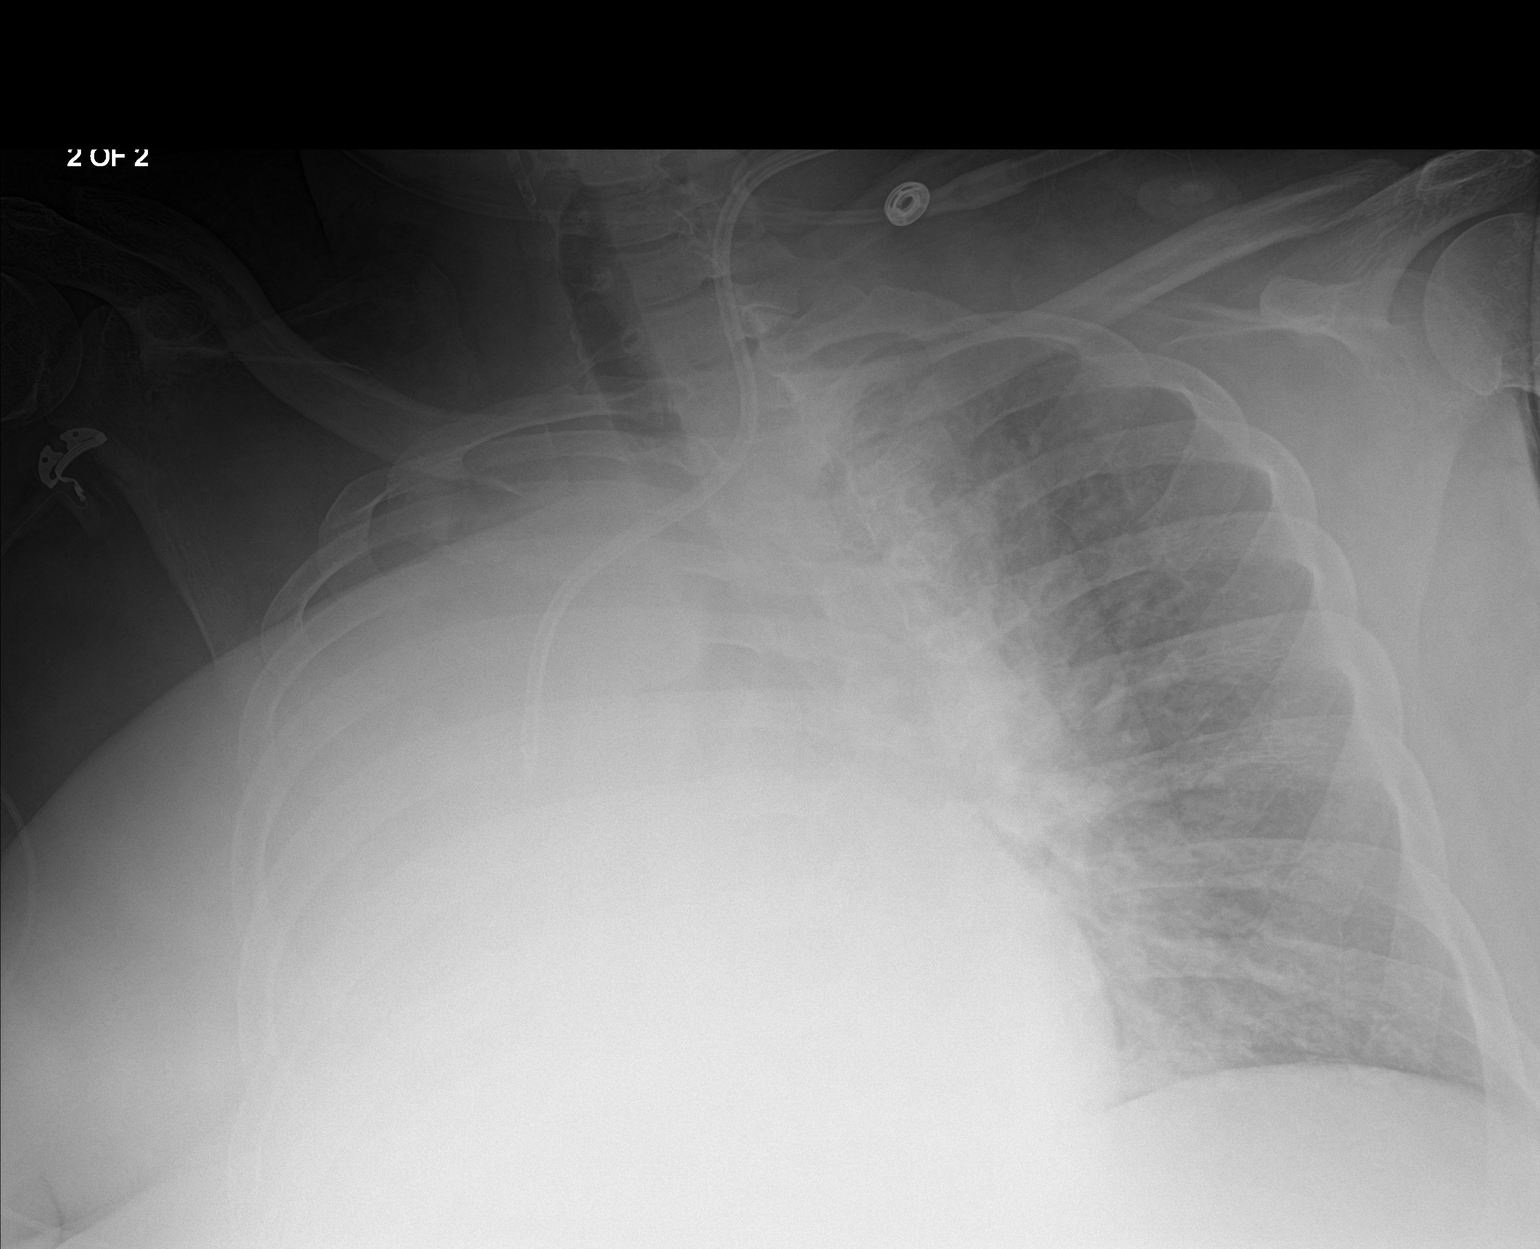

[2 of 2 positions shown; findings below may reference images not displayed]

FINDINGS: Large body habitus. Two Portable AP semi upright views at [3K] hrs.
Stable left IJ approach central line. Large breast tissue projects
over the right chest. However, right lung ventilation does appear
decreased since [DATE] with associated abrupt cut off of the
right central airways (arrows on image 2). The left lung appears
scratched at the left lung is stable in clear when allowing for
portable technique. The left mediastinal contours are stable.
Visualized tracheal air column is within normal limits.
IMPRESSION: 1. Progressive right lung opacification since [DATE] is
nonspecific, but consider right central airway mucous plugging.
2. The left lung remains clear.

## 2017-04-14 MED ORDER — FUROSEMIDE 10 MG/ML IJ SOLN
80.0000 mg | Freq: Four times a day (QID) | INTRAMUSCULAR | Status: AC
  Administered 2017-04-14 – 2017-04-15 (×6): 80 mg via INTRAVENOUS
  Filled 2017-04-14 (×7): qty 8

## 2017-04-14 NOTE — Progress Notes (Signed)
Patient sitting on side on bed with both upper rails up. Per patient husband adjusted side rails to a lower position for patient comfort. Per patient husband attempted to place patient back in a lying position alone and was unsuccessful. He then notified staff that patient wanted to get back in bed. Once he returned to the room he discovered the patient sliding out of bed and notified staff once more. Patient found with lower extremities hanging off side of bed. Two staff members attempted to place patient back in bed. Unsuccessful. Patient lowered to floor. Placed back in bed using lift. Vital signs stable. No complaints of pain. MD notified. House supervisor notified.

## 2017-04-14 NOTE — Care Management (Signed)
CM spoke with patient about refusal to participate with physical therapy.  Discussed that if she is deemed to have esrd and chooses to have  chronic dialysis, she must be out of bed and must sit for her treatments.  Her only other option would be a transfer to a skilled nursing facility out of state as there is no stretcher dialysis in the state. Discussed that she must put for an effort to participate with measures to increase her mobility even if she is fatigued or doesn't feel well. Discussed that she must sit for her treatments- even if being provided in her room. Patient is preventing progression of her care plan.  It was discussed on 5/15 that patient should be out of bed in chair at least twice daily but does not appear she has been out of bed. There is no order.  Will discuss with care team

## 2017-04-14 NOTE — Progress Notes (Signed)
Attempted bipap with this patient. Patient requested to take off after appx 5 minutes and ask for something for anxiety. Reported to RN. bipap on standby for now

## 2017-04-14 NOTE — Progress Notes (Signed)
[facility] [place], [place] 04/14/17  Subjective:  Unfortunately patient's renal function continues to be quite low. Urine output was only 100 cc over the preceding 24 hours. Patient seen during dialysis treatment She only got 1 hour of treatment and then signed off early because of discomfort in the chair 2 L of fluid was removed today  Objective:  Vital signs in last 24 hours:  Temp:  [98.1 F (36.7 C)-98.9 F (37.2 C)] 98.4 F (36.9 C) (02/18 1055) Pulse Rate:  [105-109] 109 (02/18 1130) Resp:  [18-28] 28 (02/18 1130) BP: (125-164)/(62-93) 135/84 (02/18 1130) SpO2:  [92 %-100 %] 100 % (02/18 1130) Weight:  [209 kg (460 lb 12.2 oz)] 209 kg (460 lb 12.2 oz) (02/18 0408)  Weight change: 4 kg (8 lb 13.1 oz) Filed Weights   04/12/17 0429 04/13/17 0505 04/14/17 0408  Weight: (!) 200 kg (440 lb 14.7 oz) (!) 205 kg (451 lb 15.1 oz) (!) 209 kg (460 lb 12.2 oz)    Intake/Output:    Intake/Output Summary (Last 24 hours) at 04/14/2017 1243 Last data filed at 04/14/2017 0423 Gross per 24 hour  Intake -  Output 100 ml  Net -100 ml     Physical Exam: General: Laying in bed, NAD  HEENT Normocephalic and atraumatic.  Neck supple  Pulm/lungs Bilateral rales, normal effort  CVS/Heart RRR no obvious rub  Abdomen:  BS present, distension noted  Extremities: 2+ b/l lower extremity edema  Neurologic: Alert, oriented, able to answer questions  Skin: Venous stasis changes noted b/l lower extremities  Access:  L IJ temporary dialysis catheter       Basic Metabolic Panel:  Recent Labs  Lab 04/09/17 2240 04/10/17 0547 04/11/17 0907  NA  --  138  --   K  --  4.4  --   CL  --  94*  --   CO2  --  35*  --   GLUCOSE  --  215*  --   BUN  --  58*  --   CREATININE  --  3.00*  --   CALCIUM  --  8.6*  --   PHOS 4.8*  --  5.0*     CBC: Recent Labs  Lab 04/08/17 0535 04/12/17 0459  WBC 6.3 7.1  HGB 9.1* 8.1*  HCT 30.2* 27.1*  MCV 86.0 87.9  PLT 403 347       Lab Results  Component Value Date   HEPBSAG Negative 04/04/2017      Microbiology:  No results found for this or any previous visit (from the past 240 hour(s)).  Coagulation Studies: No results for input(s): LABPROT, INR in the last 72 hours.  Urinalysis: No results for input(s): COLORURINE, LABSPEC, PHURINE, GLUCOSEU, HGBUR, BILIRUBINUR, KETONESUR, PROTEINUR, UROBILINOGEN, NITRITE, LEUKOCYTESUR in the last 72 hours.  Invalid input(s): APPERANCEUR    Imaging: No results found.   Medications:   . sodium chloride     . aspirin EC  81 mg Oral Daily  . atorvastatin  80 mg Oral q1800  . citalopram  40 mg Oral Daily  . feeding supplement (NEPRO CARB STEADY)  237 mL Oral TID WC  . furosemide  80 mg Intravenous Q6H  . heparin  5,000 Units Subcutaneous Q8H  . hydrocerin   Topical BID  . insulin aspart  0-15 Units Subcutaneous TID WC  . insulin aspart  0-5 Units Subcutaneous QHS  . insulin aspart  3 Units Subcutaneous TID WC  . insulin glargine  12  Units Subcutaneous QHS  . metoprolol tartrate  50 mg Oral BID  . multivitamin  1 tablet Oral QHS  . multivitamin with minerals  1 tablet Oral Daily  . polyethylene glycol  17 g Oral Daily  . sodium chloride  2 spray Each Nare QID  . sodium chloride flush  3 mL Intravenous Q12H   sodium chloride, acetaminophen **OR** acetaminophen, cyclobenzaprine, diphenhydrAMINE, ipratropium-albuterol, lactulose, ondansetron **OR** ondansetron (ZOFRAN) IV, sodium chloride flush  Assessment/ Plan:  47 y.o. African American morbidly obese female with h/o diastolic congestive heart failure, chronic kidney disease stage 3-4, HTN, DM, iron deficiency anemia, medication and treatment noncompliance admitted following appointment at the [facility] when she presented with diffuse edema and pain most notable in b/l lower extremities and abdomen.   1. Acute renal failure on chronic kidney disease stage 3   - Baseline Cr 1.57 (12/2016)/  GFR 45 -  Admit Cr 2.65--> 2.78 --> 3.0 (2/7).  - 24 hr urine Creatinine clearance is 16 cc/min (04/04/2017) -She has very little urine output has been was only measured as being 100 cc over the preceding 24 hours.  She would likely need ongoing dialysis.  She may become dialysis dependent and we will consider diagnosis of end-stage renal disease next week.  She still has a temporary internal jugular dialysis catheter in place.   Vascular surgery has been consulted for tunneled dialysis catheter placement Patient is still not sure whether she wants to continue ongoing dialysis She is requesting another trial of Lasix therefore we are starting Lasix IV 60 mg 6 every hours  2. Acute on chronic diastolic congestive heart failure (grade 1 diastolic dysfunction) with [place] 2018 Cardiac studies: Normal LVEF with mild to moderate MR and grade 1 diastolic dysfunction. Mildly dilated LV with EF ~55% and normal wall motion.  -Continue to perform perform hemodialysis with ultrafiltration.  As above significant volume removal was performed last week.  3. DM-2 with CKD - A1C 13.2 (11/2014) --> 7.3 (02/2017) -Blood glucose management as per hospitalist.  4.  Disposition If patient agrees to permanent dialysis then she will require tunneled dialysis catheter She will also need to be able to sit in a recliner chair for the duration of the treatment    LOS: [place] 2/18/201912:43 [place], [place]

## 2017-04-14 NOTE — Progress Notes (Signed)
Physical Therapy Treatment Patient Details Name: [name] MRN: [id number] DOB: 1971/01/21 Today's Date: 04/14/2017    History of Present Illness Pt is a34 y.o.femalewith a known history of systolic CHF, hypertension, diabetes, morbid obesity who was recently discharged from the hospital returns from CHF clinic due to weight gain, worsening shortness of breath. Attempts were made to start IV Lasix through home health but were unsuccessful. Also due to her severe shortness of breath was sent to the emergency room. Here chest x-ray shows pulmonary edema and right effusion which is chronic. Patient has been compliant with medications, fluid restriction and salt restriction.  Assessment includes: Acute on chronic diastolic congestive heart failure, acute on chronic respiratory failure with tachypnea due to CHF, HTN, DM, elevated troponin likely due to CHF, and CKD stage IV.    PT Comments    Pt continues to present with deficits in strength, transfers, mobility, gait, balance, and activity tolerance.  Pt required extensive assistance with bed mobility tasks including +1 Max A for sup to/from sit and +2 Max A for scooting to [facility].  Pt's baseline SpO2 on 4LO2/min 89-90% with SpO2 dropping to 81-82% after sitting at EOB 8-10 min, nursing notified.  Transfers not attempted this session secondary to pt fatigue, SpO2 dropping to low 80s after sitting at EOB, and recent pt falls with reports of "legs giving out on me".  Pt's current POC remains appropriate with goals updated for duration and to delete stair training goal that is not appropriate at this time secondary to pt weakness and extremely high fall risk.  Pt will benefit from PT services in a SNF setting upon discharge to safely address above deficits for decreased caregiver assistance and eventual return to PLOF.     Follow Up Recommendations  SNF     Equipment Recommendations       Recommendations for Other Services        Precautions / Restrictions Precautions Precautions: Fall Restrictions Weight Bearing Restrictions: No    Mobility  Bed Mobility Overal bed mobility: Needs Assistance Bed Mobility: Supine to Sit;Sit to Supine     Supine to sit: Max assist;HOB elevated Sit to supine: Max assist;HOB elevated   General bed mobility comments: Mod verbal cues for sequencing  Transfers                 General transfer comment: NT this session secondary to pt fatigue and SpO2 dropping to 81% in sitting at EOB, nursing notified  Ambulation/Gait             General Gait Details: Unable/unsafe to attempt   Stairs            Wheelchair Mobility    Modified Rankin (Stroke Patients Only)       Balance Overall balance assessment: Needs assistance Sitting-balance support: Feet supported;Feet unsupported;Single extremity supported Sitting balance-Leahy Scale: Fair Sitting balance - Comments: Pt shifted to R in sitting secondary to body habitus       Standing balance comment: NT this session                            Cognition Arousal/Alertness: Awake/alert Behavior During Therapy: WFL for tasks assessed/performed Overall Cognitive Status: Within Functional Limits for tasks assessed  Exercises Total Joint Exercises Ankle Circles/Pumps: AROM;Both;5 reps;10 reps Quad Sets: Strengthening;Both;10 reps Heel Slides: AAROM;Both;5 reps Hip ABduction/ADduction: AAROM;Both;5 reps Long Arc Quad: AROM;Both;5 reps;10 reps Knee Flexion: AROM;Both;5 reps;10 reps Other Exercises Other Exercises: sitting tolerance with weight shifting L/R    General Comments        Pertinent Vitals/Pain Pain Assessment: No/denies pain    Home Living                      Prior Function            PT Goals (current goals can now be found in the care plan section) Progress towards PT goals: Not progressing toward goals  - comment(Pt limited by fatigue and low SpO2 with exertion)    Frequency    Min 2X/week      PT Plan Current plan remains appropriate    Co-evaluation              AM-PAC PT "6 Clicks" Daily Activity  Outcome Measure                   End of Session Equipment Utilized During Treatment: Oxygen Activity Tolerance: Patient limited by fatigue Patient left: in bed;with call bell/phone within reach;with bed alarm set;with nursing/sitter in room;with family/visitor present Nurse Communication: Mobility status;Other (comment)(SpO2 in sitting at EOB) PT Visit Diagnosis: Difficulty in walking, not elsewhere classified (R26.2);Muscle weakness (generalized) (M62.81)     Time: 4081-4481 PT Time Calculation (min) (ACUTE ONLY): 24 min  Charges:  $Therapeutic Exercise: 8-22 mins $Therapeutic Activity: 8-22 mins                    G Codes:       DRoyetta Asal PT, DPT 04/14/17, 4:21 PM

## 2017-04-14 NOTE — Progress Notes (Signed)
Patient ID: [name], female   DOB: [birth year], 47 y.o.   MRN: [id number]  [facility] PROGRESS NOTE  [name] LPF:[id number] DOB: 1970-07-25 DOA: 03/24/2017 PCP: [name], NP  HPI/Subjective: I told the patient that she has to get up and walk with physical therapy.  She needs to sit with dialysis and tolerated.  She needs an outpatient slot.  Patient feels okay.  I had to wake her up again while I was talking with her.  Objective: Vitals:   04/14/17 1130 04/14/17 1559  BP: 135/84 (!) 154/96  Pulse: (!) 109 (!) 111  Resp: (!) 28 18  Temp:  98.6 F (37 C)  SpO2: 100% (!) 89%    Filed Weights   04/12/17 0429 04/13/17 0505 04/14/17 0408  Weight: (!) 200 kg (440 lb 14.7 oz) (!) 205 kg (451 lb 15.1 oz) (!) 209 kg (460 lb 12.2 oz)    ROS: Review of Systems  Constitutional: Negative for chills and fever.  Eyes: Negative for blurred vision.  Respiratory: Positive for shortness of breath. Negative for cough.   Cardiovascular: Negative for chest pain.  Gastrointestinal: Negative for abdominal pain, constipation, diarrhea, nausea and vomiting.  Genitourinary: Negative for dysuria.  Musculoskeletal: Negative for joint pain.  Neurological: Negative for dizziness and headaches.   Exam: Physical Exam  Constitutional: She is oriented to person, place, and time.  HENT:  Nose: No mucosal edema.  Mouth/Throat: No oropharyngeal exudate or posterior oropharyngeal edema.  Eyes: Conjunctivae, EOM and lids are normal. Pupils are equal, round, and reactive to light.  Neck: No JVD present. Carotid bruit is not present. No edema present. No thyroid mass and no thyromegaly present.  Cardiovascular: S1 normal and S2 normal. Exam reveals no gallop.  No murmur heard. Pulses:      Dorsalis pedis pulses are 2+ on the right side, and 2+ on the left side.  Respiratory: No respiratory distress. She has decreased breath sounds in the right lower field and the left lower field. She  has no wheezes. She has no rhonchi. She has no rales.  GI: Soft. Bowel sounds are normal. She exhibits distension. There is no tenderness.  Musculoskeletal:       Right ankle: She exhibits swelling.       Left ankle: She exhibits swelling.  Lymphadenopathy:    She has no cervical adenopathy.  Neurological: She is alert and oriented to person, place, and time. No cranial nerve deficit.  Skin: Skin is warm. No rash noted. Nails show no clubbing.  Psychiatric: She has a normal mood and affect.      Data Reviewed: Basic Metabolic Panel: Recent Labs  Lab 04/09/17 2240 04/10/17 0547 04/11/17 0907  NA  --  138  --   K  --  4.4  --   CL  --  94*  --   CO2  --  35*  --   GLUCOSE  --  215*  --   BUN  --  58*  --   CREATININE  --  3.00*  --   CALCIUM  --  8.6*  --   PHOS 4.8*  --  5.0*   CBC: Recent Labs  Lab 04/08/17 0535 04/12/17 0459  WBC 6.3 7.1  HGB 9.1* 8.1*  HCT 30.2* 27.1*  MCV 86.0 87.9  PLT 403 347   BNP (last 3 results) Recent Labs    01/07/17 1039 03/03/17 1114 03/27/17 0702  BNP 456.0* 617.0* 577.0*  CBG: Recent Labs  Lab 04/13/17 1213 04/13/17 1643 04/13/17 2115 04/14/17 0811 04/14/17 1447  GLUCAP 145* 130* 130* 177* 181*     Scheduled Meds: . aspirin EC  81 mg Oral Daily  . atorvastatin  80 mg Oral q1800  . citalopram  40 mg Oral Daily  . feeding supplement (NEPRO CARB STEADY)  237 mL Oral TID WC  . furosemide  80 mg Intravenous Q6H  . heparin  5,000 Units Subcutaneous Q8H  . hydrocerin   Topical BID  . insulin aspart  0-15 Units Subcutaneous TID WC  . insulin aspart  0-5 Units Subcutaneous QHS  . insulin aspart  3 Units Subcutaneous TID WC  . insulin glargine  12 Units Subcutaneous QHS  . metoprolol tartrate  50 mg Oral BID  . multivitamin  1 tablet Oral QHS  . multivitamin with minerals  1 tablet Oral Daily  . polyethylene glycol  17 g Oral Daily  . sodium chloride  2 spray Each Nare QID  . sodium chloride flush  3 mL  Intravenous Q12H   Continuous Infusions: . sodium chloride      Assessment/Plan:  1. Acute on chronic hypoxic hypercarbic respiratory failure secondary to acute CHF exacerbation.  Patient currently on 4 L of oxygen.  Trial of BiPAP at night.  Would be a candidate for noninvasive ventilation at night. 2. Acute on chronic diastolic congestive heart failure.  Dialysis to help out with fluid management.  Continue metoprolol. 3. Acute kidney injury on chronic kidney disease stage III.  Nephrology has not yet declared her end-stage.  Dialysis currently needed for fluid management.  Patient needs an outpatient slot, needs PermCath on Tuesday, needs to sit up and tolerate dialysis. 4. Morbid obesity.  Likely has sleep apnea underlying also.  Recommend outpatient sleep study. 5. Constipation start MiraLAX.  Patient requested Fleet enema 6. Essential hypertension on metoprolol 7. Hyperlipidemia unspecified on atorvastatin 8. Depression on Celexa 9. Type 2 diabetes mellitus on Lantus and sliding scale 10. Weakness.  Patient must walk with physical therapy    Code Status:     Code Status Orders  (From admission, onward)        Start     Ordered   03/24/17 1951  Full code  Continuous     03/24/17 1952    Code Status History    Date Active Date Inactive Code Status Order ID Comments User Context   03/03/2017 14:20 03/10/2017 21:31 Full Code [id number]  Fritzi Mandes, MD ED   01/07/2017 17:25 01/10/2017 19:00 Full Code [id number]  Demetrios Loll, MD Inpatient   11/08/2016 04:50 11/16/2016 17:17 Full Code [id number]  Harvie Bridge, DO Inpatient   06/25/2016 14:47 06/29/2016 18:15 Full Code [id number]  Hillary Bow, MD ED   08/09/2015 17:57 08/13/2015 17:21 Full Code [id number]  Bettey Costa, MD Inpatient   12/31/2014 20:25 01/03/2015 17:49 Full Code [id number]  Lytle Butte, MD ED   12/15/2014 11:54 12/19/2014 16:57 Full Code [id number]  Hillary Bow, MD ED     Family Communication: Husband at  bedside Disposition Plan: We will need to walk with physical therapy, will need to sit up with dialysis, will need outpatient dialysis slot, will also likely need PermCath insertion  Consultants:  Nephrology  Time spent: 25 minutes.  Case discussed with nephrology  [facility]

## 2017-04-15 DIAGNOSIS — I5020 Unspecified systolic (congestive) heart failure: Secondary | ICD-10-CM

## 2017-04-15 DIAGNOSIS — I110 Hypertensive heart disease with heart failure: Secondary | ICD-10-CM

## 2017-04-15 DIAGNOSIS — Z7189 Other specified counseling: Secondary | ICD-10-CM

## 2017-04-15 DIAGNOSIS — N186 End stage renal disease: Secondary | ICD-10-CM

## 2017-04-15 DIAGNOSIS — I5033 Acute on chronic diastolic (congestive) heart failure: Secondary | ICD-10-CM

## 2017-04-15 DIAGNOSIS — Z515 Encounter for palliative care: Secondary | ICD-10-CM

## 2017-04-15 LAB — RENAL FUNCTION PANEL
ALBUMIN: 2.8 g/dL — AB (ref 3.5–5.0)
ANION GAP: 12 (ref 5–15)
BUN: 65 mg/dL — ABNORMAL HIGH (ref 6–20)
CALCIUM: 8.7 mg/dL — AB (ref 8.9–10.3)
CO2: 32 mmol/L (ref 22–32)
Chloride: 94 mmol/L — ABNORMAL LOW (ref 101–111)
Creatinine, Ser: 4.17 mg/dL — ABNORMAL HIGH (ref 0.44–1.00)
GFR calc non Af Amer: 12 mL/min — ABNORMAL LOW (ref >60)
GFR, EST AFRICAN AMERICAN: 14 mL/min — AB (ref >60)
Glucose, Bld: 229 mg/dL — ABNORMAL HIGH (ref 65–99)
PHOSPHORUS: 5.2 mg/dL — AB (ref 2.5–4.6)
POTASSIUM: 4.7 mmol/L (ref 3.5–5.1)
SODIUM: 138 mmol/L (ref 135–145)

## 2017-04-15 LAB — GLUCOSE, CAPILLARY
GLUCOSE-CAPILLARY: 205 mg/dL — AB (ref 65–99)
GLUCOSE-CAPILLARY: 189 mg/dL — AB (ref 65–99)
GLUCOSE-CAPILLARY: 136 mg/dL — AB (ref 65–99)
Glucose-Capillary: 193 mg/dL — ABNORMAL HIGH (ref 65–99)
Glucose-Capillary: 134 mg/dL — ABNORMAL HIGH (ref 65–99)

## 2017-04-15 MED ORDER — TUBERCULIN PPD 5 UNIT/0.1ML ID SOLN
5.0000 [IU] | Freq: Once | INTRADERMAL | Status: AC
  Administered 2017-04-15: 5 [IU] via INTRADERMAL
  Filled 2017-04-15: qty 0.1

## 2017-04-15 MED ORDER — DULOXETINE HCL 20 MG PO CPEP
20.0000 mg | ORAL_CAPSULE | Freq: Every day | ORAL | Status: DC
  Administered 2017-04-15: 20 mg via ORAL
  Filled 2017-04-15 (×3): qty 1

## 2017-04-15 NOTE — Progress Notes (Signed)
[facility], [place] 04/15/17  Subjective:  Unfortunately patient's renal function continues to be quite low. Urine output 350 cc over the preceding 24 hours despite iv lasix  She only got 1 hour of treatment and then signed off early because of discomfort in the chair 2 L of fluid was removed  Patient is back and forth about proceeding with HD due to anxiety   Objective:  Vital signs in last 24 hours:  Temp:  [97.9 F (36.6 C)-98.6 F (37 C)] 97.9 F (36.6 C) (02/19 1125) Pulse Rate:  [101-112] 110 (02/19 1125) Resp:  [6-26] 20 (02/19 1125) BP: (124-160)/(74-96) 133/86 (02/19 1125) SpO2:  [89 %-100 %] 92 % (02/19 1125) Weight:  [201 kg (443 lb 2 oz)] 201 kg (443 lb 2 oz) (02/19 0459)  Weight change: -8 kg (-10.2 oz) Filed Weights   04/13/17 0505 04/14/17 0408 04/15/17 0459  Weight: (!) 205 kg (451 lb 15.1 oz) (!) 209 kg (460 lb 12.2 oz) (!) 201 kg (443 lb 2 oz)    Intake/Output:    Intake/Output Summary (Last 24 hours) at 04/15/2017 1209 Last data filed at 04/15/2017 0505 Gross per 24 hour  Intake -  Output 350 ml  Net -350 ml     Physical Exam: General: Laying in bed, NAD  HEENT Normocephalic and atraumatic.  Neck supple  Pulm/lungs Clear b/l, normal effort  CVS/Heart RRR no obvious rub  Abdomen:  BS present, distension noted  Extremities: 2+ b/l lower extremity edema  Neurologic: Alert, oriented, able to answer questions  Skin: Venous stasis changes noted b/l lower extremities, + dependent edema over abdomen  Access:  L IJ temporary dialysis catheter       Basic Metabolic Panel:  Recent Labs  Lab 04/09/17 2240 04/10/17 0547 04/11/17 0907 04/15/17 0550  NA  --  138  --  138  K  --  4.4  --  4.7  CL  --  94*  --  94*  CO2  --  35*  --  32  GLUCOSE  --  215*  --  229*  BUN  --  58*  --  65*  CREATININE  --  3.00*  --  4.17*  CALCIUM  --  8.6*  --  8.7*  PHOS 4.8*  --  5.0* 5.2*     CBC: Recent Labs  Lab  04/12/17 0459  WBC 7.1  HGB 8.1*  HCT 27.1*  MCV 87.9  PLT 347      Lab Results  Component Value Date   HEPBSAG Negative 04/04/2017      Microbiology:  No results found for this or any previous visit (from the past 240 hour(s)).  Coagulation Studies: No results for input(s): LABPROT, INR in the last 72 hours.  Urinalysis: No results for input(s): COLORURINE, LABSPEC, PHURINE, GLUCOSEU, HGBUR, BILIRUBINUR, KETONESUR, PROTEINUR, UROBILINOGEN, NITRITE, LEUKOCYTESUR in the last 72 hours.  Invalid input(s): APPERANCEUR    Imaging: Dg Chest Port 1 View  Result Date: 04/14/2017 CLINICAL DATA:  47 year old female with shortness of breath. EXAM: PORTABLE CHEST 1 VIEW COMPARISON:  04/04/2017 and earlier. FINDINGS: Large body habitus. Two Portable AP semi upright views at 1418 hrs. Stable left IJ approach central line. Large breast tissue projects over the right chest. However, right lung ventilation does appear decreased since 04/04/2017 with associated abrupt cut off of the right central airways (arrows on image 2). The left lung appears scratched at the left lung is stable in clear when allowing  for portable technique. The left mediastinal contours are stable. Visualized tracheal air column is within normal limits. IMPRESSION: 1. Progressive right lung opacification since 04/04/2017 is nonspecific, but consider right central airway mucous plugging. 2. The left lung remains clear. Electronically Signed   By: Genevie Ann M.D.   On: 04/14/2017 14:42     Medications:   . sodium chloride     . aspirin EC  81 mg Oral Daily  . atorvastatin  80 mg Oral q1800  . citalopram  40 mg Oral Daily  . feeding supplement (NEPRO CARB STEADY)  237 mL Oral TID WC  . furosemide  80 mg Intravenous Q6H  . heparin  5,000 Units Subcutaneous Q8H  . hydrocerin   Topical BID  . insulin aspart  0-15 Units Subcutaneous TID WC  . insulin aspart  0-5 Units Subcutaneous QHS  . insulin aspart  3 Units  Subcutaneous TID WC  . insulin glargine  12 Units Subcutaneous QHS  . metoprolol tartrate  50 mg Oral BID  . multivitamin  1 tablet Oral QHS  . multivitamin with minerals  1 tablet Oral Daily  . polyethylene glycol  17 g Oral Daily  . sodium chloride  2 spray Each Nare QID  . sodium chloride flush  3 mL Intravenous Q12H   sodium chloride, acetaminophen **OR** acetaminophen, cyclobenzaprine, diphenhydrAMINE, ipratropium-albuterol, lactulose, ondansetron **OR** ondansetron (ZOFRAN) IV, sodium chloride flush  Assessment/ Plan:  47 y.o. African American morbidly obese female with h/o diastolic congestive heart failure, chronic kidney disease stage 3-4, HTN, DM, iron deficiency anemia, medication and treatment noncompliance admitted following appointment at the [facility] when she presented with diffuse edema and pain most notable in b/l lower extremities and abdomen.   1. Acute renal failure on chronic kidney disease stage 3   - Baseline Cr 1.57 (12/2016)/ GFR 45 -  Admit Cr 2.65--> 2.78 --> 3.0 (2/7).  - 24 hr urine Creatinine clearance is 16 cc/min (04/04/2017) -She has very little urine output has been was only measured as being 100 cc over the preceding 24 hours.  She would likely need ongoing dialysis.  She may become dialysis dependent and we will consider diagnosis of end-stage renal disease next week.  She still has a temporary internal jugular dialysis catheter in place.   Vascular surgery has been consulted for tunneled dialysis catheter placement Wednesday  2. Acute on chronic diastolic congestive heart failure (grade 1 diastolic dysfunction) with [place] 2018 Cardiac studies: Normal LVEF with mild to moderate MR and grade 1 diastolic dysfunction. Mildly dilated LV with EF ~55% and normal wall motion.  -Continue to perform perform hemodialysis with ultrafiltration.  As above significant volume removal was performed last week.  3. DM-2 with CKD - A1C 13.2 (11/2014) --> 7.3  (02/2017) - Blood glucose management as per hospitalist.  4.  Disposition If patient agrees to permanent dialysis then she will require tunneled dialysis catheter She will also need to be able to sit in a recliner chair for the duration of the treatment PPD placed 2/19    LOS: [place] 2/19/201912:09 [place], [place]

## 2017-04-15 NOTE — Progress Notes (Signed)
Nutrition Follow up Note   DOCUMENTATION CODES:   Morbid obesity  INTERVENTION:   Bowel regimen as needed per MD  Nepro Shake po TID, each supplement provides 425 kcal and 19 grams protein  MVI daily  Rena-vite daily   NUTRITION DIAGNOSIS:   Increased nutrient needs related to chronic illness(CHF, morbid obesity) as evidenced by increased estimated needs from protein.  -new HD  GOAL:   Patient will meet greater than or equal to 90% of their needs  -progressing  MONITOR:   PO intake, Supplement acceptance, Weight trends, TF tolerance, Skin, I & O's, Labs  ASSESSMENT:   47 y.o. female with a known history of systolic CHF, CKD IV, hypertension, diabetes, morbid obesity who was recently discharged from the hospital returns from CHF clinic due to weight gain, worsening shortness of breath.   Pt s/p LIJ cath placement and HD initiation 2/8  Spoke to RN today, pt's appetite seems to be improving; pt eating 50% of meals and drinking one Nepro per day. Pt also eating food being brought in by family members. Pt continues on HD; per MD note, pt will most likely need HD permanently. Per chart, pt's weight is down 55lbs since 2/10 but it still continues to be 45lbs above her admit weight. Unsure how accurate weights are d/t pt's morbid obesity. Pt -22L on her I & Os. Urine production only 356ml x 24 hrs. Pt initiated on Lasix 2/18.   Medications reviewed and include: aspirin, celexa, lasix, heparin, insulin, MVI, rena-vite, miralax  Labs reviewed: K 4.7 wnl, Cl 94(L), BUN 65(H), creat 4.17(H), Ca 8.7(L) adj. 9.66 wnl, P 5.2(H), alb 2.8(L) Hgb 8.1(L), Hct 27.1(L)  Diet Carb Modified Fluid consistency: Thin; Room service appropriate? Yes  EDUCATION NEEDS:   Education needs have been addressed  Skin:  Reviewed RN Assessment  Last BM:  2/19- type 1- constipation   Height:   Ht Readings from Last 1 Encounters:  03/24/17 5\' 4"  (1.626 m)    Weight:   Wt Readings from Last 1  Encounters:  04/15/17 (!) 443 lb 2 oz (201 kg)    Ideal Body Weight:  54.5 kg  BMI:  Body mass index is 76.06 kg/m.  Estimated Nutritional Needs:   Kcal:  2400-2700kcal/day   Protein:  >150g/day   Fluid:  per MD  Koleen Distance MS, RD, LDN Pager #[phone] After Hours Pager: [phone]

## 2017-04-15 NOTE — Progress Notes (Signed)
Inpatient Diabetes Program Recommendations  AACE/ADA: New Consensus Statement on Inpatient Glycemic Control (2015)  Target Ranges:  Prepandial:   less than 140 mg/dL      Peak postprandial:   less than 180 mg/dL (1-2 hours)      Critically ill patients:  140 - 180 mg/dL   Lab Results  Component Value Date   GLUCAP 193 (H) 04/15/2017   HGBA1C 7.3 (H) 03/05/2017    Review of Glycemic Control  Results for [name], [name] (MRN [id number]) as of 04/15/2017 11:07  Ref. Range 04/13/2017 21:15 04/14/2017 08:11 04/14/2017 14:47 04/14/2017 20:56 04/15/2017 08:04  Glucose-Capillary Latest Ref Range: 65 - 99 mg/dL 130 (H) 177 (H) 181 (H) 157 (H) 193 (H)   Diabetes history:Type 2  Outpatient Diabetes medications:Novolog 2-5 units tid with meals , Lantus 30-35 units qhs (as reported by patient)  Current orders for Inpatient glycemic control:Lantus 12 units QHS, Novolog 0-15 units TID with meals, Novolog 0-5 units QHS  Inpatient Diabetes Program Recommendations: Please consider ordering Novolog 3 units TID with meals for meal coverage if patient eats at least 50% of meals.  Please consider increasing Lantus to 21 units qhs (0.1unit/kg).

## 2017-04-15 NOTE — Progress Notes (Addendum)
Daily Progress Note   Patient Name: [name]       Date: 04/15/2017 DOB: Jun 08, 1970  Age: 47 y.o. MRN#: [id number] Attending Physician: Loletha Grayer, MD Primary Care Physician: [name], NP Admit Date: 03/24/2017  Reason for Consultation/Follow-up: Establishing goals of care  Subjective: Patient in bed. Discussed need for long term dialysis. She fears she is going to be on dialysis forever. Dr. Candiss Norse at bedside explained that is becoming an unfortunate reality. She is struggling to accept this. She is willing to interact more with PT today and attempt to sit up for dialysis. Her GOC is to go to rehab and hope to return home and become independent with her ADL's.  Attempted to discuss advance directives. She did not want to engage.  She had some anxiety on the bipap. Would recommend a short acting anti anxiety medication to assist with bipap tolerance if indicated.   Review of Systems  Constitutional: Positive for malaise/fatigue.  Respiratory: Positive for shortness of breath.   Cardiovascular: Negative for chest pain.  Psychiatric/Behavioral: The patient is nervous/anxious.     Length of Stay: 22  Current Medications: Scheduled Meds:  . aspirin EC  81 mg Oral Daily  . atorvastatin  80 mg Oral q1800  . citalopram  40 mg Oral Daily  . feeding supplement (NEPRO CARB STEADY)  237 mL Oral TID WC  . furosemide  80 mg Intravenous Q6H  . heparin  5,000 Units Subcutaneous Q8H  . hydrocerin   Topical BID  . insulin aspart  0-15 Units Subcutaneous TID WC  . insulin aspart  0-5 Units Subcutaneous QHS  . insulin aspart  3 Units Subcutaneous TID WC  . insulin glargine  12 Units Subcutaneous QHS  . metoprolol tartrate  50 mg Oral BID  . multivitamin  1 tablet Oral QHS  .  multivitamin with minerals  1 tablet Oral Daily  . polyethylene glycol  17 g Oral Daily  . sodium chloride  2 spray Each Nare QID  . sodium chloride flush  3 mL Intravenous Q12H    Continuous Infusions: . sodium chloride      PRN Meds: sodium chloride, acetaminophen **OR** acetaminophen, cyclobenzaprine, diphenhydrAMINE, ipratropium-albuterol, lactulose, ondansetron **OR** ondansetron (ZOFRAN) IV, sodium chloride flush  Physical Exam  Constitutional: She is oriented to person, place, and  time. She appears well-developed and well-nourished.  Pulmonary/Chest: Effort normal.  Musculoskeletal:       Right lower leg: She exhibits edema.       Left lower leg: She exhibits edema.  Neurological: She is alert and oriented to person, place, and time.  Skin: Skin is warm and dry.  Nursing note and vitals reviewed.           Vital Signs: BP (!) 142/74 (BP Location: Left Arm)   Pulse (!) 101   Temp 98.1 F (36.7 C) (Axillary) Comment (Src): pt receiving nebulizer  Resp 20   Ht 5\' 4"  (1.626 m)   Wt (!) 201 kg (443 lb 2 oz)   LMP 09/06/2016 (Approximate)   SpO2 99%   BMI 76.06 kg/m  SpO2: SpO2: 99 % O2 Device: O2 Device: Aerosol Mask O2 Flow Rate: O2 Flow Rate (L/min): 7 L/min  Intake/output summary:   Intake/Output Summary (Last 24 hours) at 04/15/2017 1042 Last data filed at 04/15/2017 0505 Gross per 24 hour  Intake -  Output 350 ml  Net -350 ml   LBM: Last BM Date: 04/15/17 Baseline Weight: Weight: (!) 180.5 kg (398 lb) Most recent weight: Weight: (!) 201 kg (443 lb 2 oz)       Palliative Assessment/Data: PPS: 40%   Flowsheet Rows     Most Recent Value  Intake Tab  Referral Department  Hospitalist  Unit at Time of Referral  Cardiac/Telemetry Unit  Palliative Care Primary Diagnosis  Cardiac  Date Notified  03/25/17  Palliative Care Type  New Palliative care  Reason for referral  Clarify Goals of Care  Date of Admission  03/24/17  Date first seen by Palliative Care   03/26/17  # of days Palliative referral response time  1 Day(s)  # of days IP prior to Palliative referral  1  Clinical Assessment  Psychosocial & Spiritual Assessment  Palliative Care Outcomes      Patient Active Problem List   Diagnosis Date Noted  . ESRD (end stage renal disease) ([place])   . DNR (do not resuscitate) discussion   . Stage 4 chronic kidney disease ([place])   . Adjustment disorder with mixed anxiety and depressed mood 03/31/2017  . Dysthymia 03/31/2017  . Chronic pain syndrome 03/31/2017  . Palliative care encounter   . Obesity hypoventilation syndrome ([place]) 03/09/2017  . CHF (congestive heart failure) ([place]) 03/03/2017  . Chronic diastolic heart failure ([place]) 01/07/2017  . Pneumonia 01/07/2017  . Acute exacerbation of CHF (congestive heart failure) ([place]) 11/07/2016  . Depression 09/24/2016  . Acute on chronic diastolic heart failure ([place]) 07/04/2016  . COPD with chronic bronchitis ([place]) 07/04/2016  . CKD (chronic kidney disease), stage III ([place]) 06/26/2016  . COPD exacerbation ([place]) 03/25/2016  . Anxiety 10/02/2015  . Asthma 08/30/2015  . Poorly controlled type 2 diabetes mellitus ([place]) 03/13/2015  . Iron deficiency anemia 02/17/2015  . Obesity, morbid ([place])   . Shortness of breath   . Essential hypertension   . Hypertensive heart disease with congestive heart failure ([place]) 12/18/2014  . Diabetes ([place]) 12/15/2014    Palliative Care Assessment & Plan   Patient Profile: 47 y.o. female  with past medical history of diastolic heart failure and chronic kidney disease (stg 3-4), morbid obesity, COPD, uncontrolled DM, OSA, accelerated HTN who was admitted on 03/24/2017 from the heart failure clinic with decompensated heart failure and shortness of breath.  In the hospital she is being followed by cardiology and is currently on a  lasix gtt with metolazone.  She has intermittently refused BP medications. Plan is for dialysis.  Assessment/Recommendations/Plan   Continue  current care  PMT will shadow and intervene if patient declines- please call if assistance is needed sooner  GOC are set to get dialysis access with hope of tolerating chair dialysis, d/c to rehab with hope of eventually going home  Goals of Care and Additional Recommendations:  Limitations on Scope of Treatment: Full Scope Treatment  Code Status:  Full code  Prognosis:   Unable to determine  Discharge Planning:  [facility] for rehab with Palliative care service follow-up  Care plan was discussed with patient   Thank you for allowing the Palliative Medicine Team to assist in the care of this patient.   Time In: 1000 Time Out: 1045 Total Time 45 mins Prolonged Time Billed No      Greater than 50%  of this time was spent counseling and coordinating care related to the above assessment and plan.  Mariana Kaufman, AGNP-C Palliative Medicine   Please contact Palliative Medicine Team phone at [phone] for questions and concerns.

## 2017-04-15 NOTE — Progress Notes (Signed)
Dialysis canceled for today due to equipment changes.  Will resume again tomorrow.  Explained to [name] and she is fine with it as she wanted a break from dialysis anyway.

## 2017-04-15 NOTE — Progress Notes (Signed)
Patient ID: [name], female   DOB: [birth year], 47 y.o.   MRN: [id number]  [employer] PROGRESS NOTE  [name] JSH:[id number] DOB: 12-Mar-1970 DOA: 03/24/2017 PCP: [name], NP  HPI/Subjective: Patient does not want long-term dialysis.  I advised that if she needs dialysis she is going to have to have it in order to survive.  Patient very weak and unable to tolerate dialysis sitting up.  Objective: Vitals:   04/15/17 1125 04/15/17 1700  BP: 133/86 (!) 152/88  Pulse: (!) 110 (!) 115  Resp: 20 20  Temp: 97.9 F (36.6 C) 98.1 F (36.7 C)  SpO2: 92% 92%    Filed Weights   04/13/17 0505 04/14/17 0408 04/15/17 0459  Weight: (!) 205 kg (451 lb 15.1 oz) (!) 209 kg (460 lb 12.2 oz) (!) 201 kg (443 lb 2 oz)    ROS: Review of Systems  Constitutional: Negative for chills and fever.  Eyes: Negative for blurred vision.  Respiratory: Positive for shortness of breath. Negative for cough.   Cardiovascular: Negative for chest pain.  Gastrointestinal: Negative for abdominal pain, constipation, diarrhea, nausea and vomiting.  Genitourinary: Negative for dysuria.  Musculoskeletal: Negative for joint pain.  Neurological: Negative for dizziness and headaches.   Exam: Physical Exam  Constitutional: She is oriented to person, place, and time.  HENT:  Nose: No mucosal edema.  Mouth/Throat: No oropharyngeal exudate or posterior oropharyngeal edema.  Eyes: Conjunctivae, EOM and lids are normal. Pupils are equal, round, and reactive to light.  Neck: No JVD present. Carotid bruit is not present. No edema present. No thyroid mass and no thyromegaly present.  Cardiovascular: S1 normal and S2 normal. Exam reveals no gallop.  No murmur heard. Pulses:      Dorsalis pedis pulses are 2+ on the right side, and 2+ on the left side.  Respiratory: No respiratory distress. She has decreased breath sounds in the right lower field and the left lower field. She has no wheezes. She has no  rhonchi. She has no rales.  GI: Soft. Bowel sounds are normal. She exhibits distension. There is no tenderness.  Musculoskeletal:       Right ankle: She exhibits swelling.       Left ankle: She exhibits swelling.  Lymphadenopathy:    She has no cervical adenopathy.  Neurological: She is alert and oriented to person, place, and time. No cranial nerve deficit.  Skin: Skin is warm. No rash noted. Nails show no clubbing.  Psychiatric: She has a normal mood and affect.      Data Reviewed: Basic Metabolic Panel: Recent Labs  Lab 04/09/17 2240 04/10/17 0547 04/11/17 0907 04/15/17 0550  NA  --  138  --  138  K  --  4.4  --  4.7  CL  --  94*  --  94*  CO2  --  35*  --  32  GLUCOSE  --  215*  --  229*  BUN  --  58*  --  65*  CREATININE  --  3.00*  --  4.17*  CALCIUM  --  8.6*  --  8.7*  PHOS 4.8*  --  5.0* 5.2*   CBC: Recent Labs  Lab 04/12/17 0459  WBC 7.1  HGB 8.1*  HCT 27.1*  MCV 87.9  PLT 347   BNP (last 3 results) Recent Labs    01/07/17 1039 03/03/17 1114 03/27/17 0702  BNP 456.0* 617.0* 577.0*     CBG: Recent Labs  Lab  04/14/17 2056 04/15/17 0804 04/15/17 1024 04/15/17 1149 04/15/17 1658  GLUCAP 157* 193* 205* 189* 136*     Scheduled Meds: . aspirin EC  81 mg Oral Daily  . atorvastatin  80 mg Oral q1800  . DULoxetine  20 mg Oral Daily  . feeding supplement (NEPRO CARB STEADY)  237 mL Oral TID WC  . furosemide  80 mg Intravenous Q6H  . heparin  5,000 Units Subcutaneous Q8H  . hydrocerin   Topical BID  . insulin aspart  0-15 Units Subcutaneous TID WC  . insulin aspart  0-5 Units Subcutaneous QHS  . insulin aspart  3 Units Subcutaneous TID WC  . insulin glargine  12 Units Subcutaneous QHS  . metoprolol tartrate  50 mg Oral BID  . multivitamin  1 tablet Oral QHS  . multivitamin with minerals  1 tablet Oral Daily  . polyethylene glycol  17 g Oral Daily  . sodium chloride  2 spray Each Nare QID  . sodium chloride flush  3 mL Intravenous Q12H  .  tuberculin  5 Units Intradermal Once   Continuous Infusions: . sodium chloride      Assessment/Plan:  1. Acute on chronic hypoxic hypercarbic respiratory failure secondary to acute CHF exacerbation.  Patient currently on 4 L of oxygen.  Patient did not tolerate BiPAP at night.  Would be a candidate for noninvasive ventilation at night but I do not think she will tolerate it. 2. Acute on chronic diastolic congestive heart failure.  Dialysis to help out with fluid management.  Continue metoprolol. 3. Acute kidney injury on chronic kidney disease stage III.  Nephrology has not yet declared her end-stage.  Dialysis currently needed for fluid management.  Patient needs an outpatient slot, needs PermCath, needs to sit up and tolerate dialysis. 4. Morbid obesity.  Likely has sleep apnea underlying also.  Recommend outpatient sleep study. 5. Constipation on MiraLAX 6. Essential hypertension on metoprolol 7. Hyperlipidemia unspecified on atorvastatin 8. Depression on Cymbalta 9. Type 2 diabetes mellitus on Lantus and sliding scale 10. Weakness.  Patient must walk with physical therapy    Code Status:     Code Status Orders  (From admission, onward)        Start     Ordered   03/24/17 1951  Full code  Continuous     03/24/17 1952    Code Status History    Date Active Date Inactive Code Status Order ID Comments User Context   03/03/2017 14:20 03/10/2017 21:31 Full Code [id number]  Fritzi Mandes, MD ED   01/07/2017 17:25 01/10/2017 19:00 Full Code [id number]  Demetrios Loll, MD Inpatient   11/08/2016 04:50 11/16/2016 17:17 Full Code [id number]  Harvie Bridge, DO Inpatient   06/25/2016 14:47 06/29/2016 18:15 Full Code [id number]  Hillary Bow, MD ED   08/09/2015 17:57 08/13/2015 17:21 Full Code [id number]  Bettey Costa, MD Inpatient   12/31/2014 20:25 01/03/2015 17:49 Full Code [id number]  Lytle Butte, MD ED   12/15/2014 11:54 12/19/2014 16:57 Full Code [id number]  Hillary Bow, MD ED     Family  Communication: Husband at bedside Disposition Plan: Unclear at this point.  Patient needs to sit up and tolerate dialysis in order to be discharged from the hospital.  Patient will need a PermCath placement.  Patient will need to walk better with physical therapy.  Consultants:  Nephrology  Time spent: 25 minutes.  Case discussed with nephrology.  Zaide Kardell [employer]

## 2017-04-15 NOTE — Progress Notes (Signed)
Patient could not be brought down to the dialysis today because she could not sit in the normal recliner chair.  Bariatric recliner chair has been ordered for the patient We will attempt dialysis in chair tomorrow

## 2017-04-16 ENCOUNTER — Inpatient Hospital Stay: Payer: Medicaid Other | Admitting: Anesthesiology

## 2017-04-16 ENCOUNTER — Encounter
Admission: EM | Disposition: A | Discharge status: Skilled Nursing Facility | Payer: Self-pay | Source: Home / Self Care | Attending: Internal Medicine

## 2017-04-16 ENCOUNTER — Telehealth: Payer: Self-pay | Admitting: Pharmacy Technician

## 2017-04-16 ENCOUNTER — Inpatient Hospital Stay: Payer: Medicaid Other

## 2017-04-16 ENCOUNTER — Encounter: Payer: Self-pay | Admitting: *Deleted

## 2017-04-16 DIAGNOSIS — N186 End stage renal disease: Secondary | ICD-10-CM

## 2017-04-16 HISTORY — PX: INSERTION OF DIALYSIS CATHETER: SHX1324

## 2017-04-16 LAB — CBC
HCT: 27.2 % — ABNORMAL LOW (ref 35.0–47.0)
Hemoglobin: 8.3 g/dL — ABNORMAL LOW (ref 12.0–16.0)
MCH: 26.5 pg (ref 26.0–34.0)
MCHC: 30.4 g/dL — ABNORMAL LOW (ref 32.0–36.0)
MCV: 87.2 fL (ref 80.0–100.0)
PLATELETS: 349 10*3/uL (ref 150–440)
RBC: 3.12 MIL/uL — AB (ref 3.80–5.20)
RDW: 16.7 % — ABNORMAL HIGH (ref 11.5–14.5)
WBC: 7.2 10*3/uL (ref 3.6–11.0)

## 2017-04-16 LAB — GLUCOSE, CAPILLARY
GLUCOSE-CAPILLARY: 118 mg/dL — AB (ref 65–99)
Glucose-Capillary: 141 mg/dL — ABNORMAL HIGH (ref 65–99)
Glucose-Capillary: 126 mg/dL — ABNORMAL HIGH (ref 65–99)
Glucose-Capillary: 105 mg/dL — ABNORMAL HIGH (ref 65–99)

## 2017-04-16 SURGERY — DIALYSIS/PERMA CATHETER INSERTION
Anesthesia: Moderate Sedation

## 2017-04-16 SURGERY — INSERTION OF DIALYSIS CATHETER
Anesthesia: Monitor Anesthesia Care | Wound class: Clean

## 2017-04-16 MED ORDER — LIDOCAINE-EPINEPHRINE (PF) 1 %-1:200000 IJ SOLN
INTRAMUSCULAR | Status: DC | PRN
  Administered 2017-04-16: 20 mL

## 2017-04-16 MED ORDER — DEXMEDETOMIDINE HCL IN NACL 200 MCG/50ML IV SOLN
INTRAVENOUS | Status: DC | PRN
  Administered 2017-04-16 (×2): 4 ug via INTRAVENOUS
  Administered 2017-04-16: 8 ug via INTRAVENOUS
  Administered 2017-04-16: 12 ug via INTRAVENOUS
  Administered 2017-04-16 (×3): 8 ug via INTRAVENOUS

## 2017-04-16 MED ORDER — OXYCODONE HCL 5 MG PO TABS
5.0000 mg | ORAL_TABLET | Freq: Four times a day (QID) | ORAL | Status: DC | PRN
  Administered 2017-04-17 – 2017-04-25 (×10): 5 mg via ORAL
  Filled 2017-04-16 (×10): qty 1

## 2017-04-16 MED ORDER — LIDOCAINE-EPINEPHRINE (PF) 1 %-1:200000 IJ SOLN
INTRAMUSCULAR | Status: AC
  Filled 2017-04-16: qty 30

## 2017-04-16 MED ORDER — MIDAZOLAM HCL 2 MG/2ML IJ SOLN
INTRAMUSCULAR | Status: AC
  Filled 2017-04-16: qty 2

## 2017-04-16 MED ORDER — ALPRAZOLAM 0.25 MG PO TABS
0.2500 mg | ORAL_TABLET | Freq: Three times a day (TID) | ORAL | Status: DC | PRN
  Administered 2017-04-17 – 2017-04-27 (×5): 0.25 mg via ORAL
  Filled 2017-04-16 (×6): qty 1

## 2017-04-16 MED ORDER — SODIUM CHLORIDE FLUSH 0.9 % IV SOLN
INTRAVENOUS | Status: AC
  Filled 2017-04-16: qty 3

## 2017-04-16 MED ORDER — HEPARIN SODIUM (PORCINE) 10000 UNIT/ML IJ SOLN
INTRAMUSCULAR | Status: AC
  Filled 2017-04-16: qty 1

## 2017-04-16 MED ORDER — MIDAZOLAM HCL 2 MG/2ML IJ SOLN
INTRAMUSCULAR | Status: DC | PRN
  Administered 2017-04-16 (×2): 1 mg via INTRAVENOUS

## 2017-04-16 MED ORDER — HEPARIN (PORCINE) IN NACL 2-0.9 UNIT/ML-% IJ SOLN
INTRAMUSCULAR | Status: AC
  Filled 2017-04-16: qty 500

## 2017-04-16 SURGICAL SUPPLY — 3 items
GLOVE BIO SURGEON STRL SZ7 (GLOVE) ×2 IMPLANT
KIT TURNOVER KIT A (KITS) ×3 IMPLANT
TOWEL OR 17X26 4PK STRL BLUE (TOWEL DISPOSABLE) ×2 IMPLANT

## 2017-04-16 NOTE — Progress Notes (Signed)
HD tx start 

## 2017-04-16 NOTE — Telephone Encounter (Signed)
Patient has Medicaid.  Patient no longer eligible to receive medication assistance from [facility].  Patient notified.  [facility]

## 2017-04-16 NOTE — Anesthesia Preprocedure Evaluation (Signed)
Anesthesia Evaluation  Patient identified by MRN, date of birth, ID band Patient awake    Reviewed: Allergy & Precautions, NPO status , [name] Chart, lab work & pertinent test results  History of Anesthesia Complications Negative for: history of anesthetic complications  Airway Mallampati: III  TM Distance: >3 FB Neck ROM: Full    Dental  (+) Poor Dentition   Pulmonary asthma , sleep apnea (has refused BiPAP while in hospital) , COPD,  COPD inhaler and oxygen dependent,    breath sounds clear to auscultation- rhonchi (-) wheezing      Cardiovascular hypertension, Pt. on medications +CHF  (-) CAD, (-) Past MI, (-) Cardiac Stents and (-) CABG  Rhythm:Regular Rate:Normal - Systolic murmurs and - Diastolic murmurs Echo 9/32/67: - Left ventricle: The cavity size was mildly dilated. Systolic   function was normal. The estimated ejection fraction was 55%.   Wall motion was normal; there were no regional wall motion   abnormalities. Doppler parameters are consistent with abnormal   left ventricular relaxation (grade 1 diastolic dysfunction). - Mitral valve: There was mild regurgitation. - Atrial septum: No defect or patent foramen ovale was identified   Neuro/Psych PSYCHIATRIC DISORDERS Anxiety Depression negative neurological ROS     GI/Hepatic negative GI ROS, Neg liver ROS,   Endo/Other  diabetes, Insulin Dependent  Renal/GU ESRF and DialysisRenal disease     Musculoskeletal negative musculoskeletal ROS (+)   Abdominal (+) + obese,   Peds  Hematology  (+) anemia ,   Anesthesia Other Findings Past Medical History: No date: (HFpEF) heart failure with preserved ejection fraction ([place])     Comment:  a. 11/2014 Echo: EF 50-55%, Gr1 DD; b. 06/2016 Echo: EF               60-65%, no rwma, mod dil LA, nl RV, triv eff. No date: Asthma No date: CHF (congestive heart failure) (HCC) No date: CKD (chronic kidney disease),  stage III (H[facility]) No date: COPD (chronic obstructive pulmonary disease) ([place])     Comment:  a. Home O2. No date: Diabetes mellitus without complication (HCC) No date: Hypertension No date: Iron deficiency anemia     Comment:  a. chronic blood loss->heavy menses. No date: Morbid obesity ([place]) No date: Pericardial effusion     Comment:  a. 11/2014 CT Chest: Mod effusion; b. 11/2014 Echo:               small to mod circumferential effusion. No hemodynamic               compromise. No date: Poorly controlled diabetes mellitus ([place])     Comment:  a. 11/2014 A1c 13.2.   Reproductive/Obstetrics                             Anesthesia Physical Anesthesia Plan  ASA: IV  Anesthesia Plan: MAC   Post-op Pain Management:    Induction: Intravenous  PONV Risk Score and Plan: 2 and Midazolam  Airway Management Planned: Natural Airway and Nasal Cannula  Additional Equipment:   Intra-op Plan:   Post-operative Plan:   Informed Consent: I have reviewed the patients History and Physical, chart, labs and discussed the procedure including the risks, benefits and alternatives for the proposed anesthesia with the patient or authorized representative who has indicated his/her understanding and acceptance.     Plan Discussed with: CRNA and Anesthesiologist  Anesthesia Plan Comments:  Anesthesia Quick Evaluation  

## 2017-04-16 NOTE — Progress Notes (Signed)
Pre HD assessment  

## 2017-04-16 NOTE — Progress Notes (Signed)
Post HD assessment. Net UF 5525 ml. Pt stable, no c/o or complications. Sequential only tx. CVC ran well, MD at bedside.

## 2017-04-16 NOTE — Care Management (Signed)
Perm cath today. Sat for one half of dialysis session yesterday and today in a bariatric transport chair, informed she sat for an entire treatment. CM will confirm

## 2017-04-16 NOTE — Progress Notes (Signed)
Patient ID: [name], female   DOB: 03-30-1970, 47 y.o.   MRN: [id number]  [employer] PROGRESS NOTE  [name] [id number]:[id number] DOB: 09-01-1970 DOA: 03/24/2017 PCP: [name], NP  HPI/Subjective: Patient has not eaten anything all day.  States she does not want the Cymbalta.  She wants something for anxiety when she has an attack.  Having some pain and soreness in her neck.  Objective: Vitals:   04/16/17 1645 04/16/17 1700  BP: (!) 152/100 (!) 160/75  Pulse: 97 96  Resp: (!) 24 (!) 23  Temp:    SpO2: 100% 100%    Filed Weights   04/16/17 0443 04/16/17 1030 04/16/17 1430  Weight: (!) 200 kg (440 lb 14.7 oz) (!) 199.6 kg (440 lb) (!) 199.6 kg (439 lb 15.9 oz)    ROS: Review of Systems  Constitutional: Negative for chills and fever.  Eyes: Negative for blurred vision.  Respiratory: Positive for shortness of breath. Negative for cough.   Cardiovascular: Negative for chest pain.  Gastrointestinal: Negative for abdominal pain, constipation, diarrhea, nausea and vomiting.  Genitourinary: Negative for dysuria.  Musculoskeletal: Negative for joint pain.  Neurological: Negative for dizziness and headaches.   Exam: Physical Exam  Constitutional: She is oriented to person, place, and time.  HENT:  Nose: No mucosal edema.  Mouth/Throat: No oropharyngeal exudate or posterior oropharyngeal edema.  Eyes: Conjunctivae, EOM and lids are normal. Pupils are equal, round, and reactive to light.  Neck: No JVD present. Carotid bruit is not present. No edema present. No thyroid mass and no thyromegaly present.  Cardiovascular: S1 normal and S2 normal. Exam reveals no gallop.  No murmur heard. Pulses:      Dorsalis pedis pulses are 2+ on the right side, and 2+ on the left side.  Respiratory: No respiratory distress. She has decreased breath sounds in the right lower field and the left lower field. She has no wheezes. She has no rhonchi. She has no rales.  GI: Soft. Bowel  sounds are normal. She exhibits distension. There is no tenderness.  Musculoskeletal:       Right ankle: She exhibits swelling.       Left ankle: She exhibits swelling.  Lymphadenopathy:    She has no cervical adenopathy.  Neurological: She is alert and oriented to person, place, and time. No cranial nerve deficit.  Skin: Skin is warm. Nails show no clubbing.  Slight bleeding at the tunneled catheter site dressing.  Chronic lower extremity discoloration  Psychiatric: She has a normal mood and affect.      Data Reviewed: Basic Metabolic Panel: Recent Labs  Lab 04/09/17 2240 04/10/17 0547 04/11/17 0907 04/15/17 0550  NA  --  138  --  138  K  --  4.4  --  4.7  CL  --  94*  --  94*  CO2  --  35*  --  32  GLUCOSE  --  215*  --  229*  BUN  --  58*  --  65*  CREATININE  --  3.00*  --  4.17*  CALCIUM  --  8.6*  --  8.7*  PHOS 4.8*  --  5.0* 5.2*   CBC: Recent Labs  Lab 04/12/17 0459  WBC 7.1  HGB 8.1*  HCT 27.1*  MCV 87.9  PLT 347   BNP (last 3 results) Recent Labs    01/07/17 1039 03/03/17 1114 03/27/17 0702  BNP 456.0* 617.0* 577.0*     CBG: Recent Labs  Lab 04/15/17 1658 04/15/17 2113 04/16/17 0859 04/16/17 1018 04/16/17 1244  GLUCAP 136* 134* 141* 126* 118*     Scheduled Meds: . aspirin EC  81 mg Oral Daily  . atorvastatin  80 mg Oral q1800  . feeding supplement (NEPRO CARB STEADY)  237 mL Oral TID WC  . heparin  5,000 Units Subcutaneous Q8H  . hydrocerin   Topical BID  . insulin aspart  0-15 Units Subcutaneous TID WC  . insulin aspart  0-5 Units Subcutaneous QHS  . insulin aspart  3 Units Subcutaneous TID WC  . insulin glargine  12 Units Subcutaneous QHS  . metoprolol tartrate  50 mg Oral BID  . multivitamin  1 tablet Oral QHS  . multivitamin with minerals  1 tablet Oral Daily  . polyethylene glycol  17 g Oral Daily  . sodium chloride  2 spray Each Nare QID  . sodium chloride flush  3 mL Intravenous Q12H  . sodium chloride flush      .  tuberculin  5 Units Intradermal Once   Continuous Infusions: . sodium chloride      Assessment/Plan:  1. Acute on chronic hypoxic hypercarbic respiratory failure secondary to acute CHF exacerbation.  Patient currently on 4 L of oxygen.   2. Acute on chronic diastolic congestive heart failure.  Dialysis to help out with fluid management.  Continue metoprolol. 3. Acute kidney injury on chronic kidney disease stage III.  Nephrology has not yet declared her end-stage.  Dialysis currently needed for fluid management.  Patient needs an outpatient slot.  Had PermCath placed today.  Patient seems to be sitting up in a chair that is reclined at dialysis currently. 4. Morbid obesity.  Likely has sleep apnea underlying also.  Recommend outpatient sleep study. 5. Constipation on MiraLAX 6. Essential hypertension on metoprolol 7. Hyperlipidemia unspecified on atorvastatin 8.  anxiety.  Patient states that she does not want the Cymbalta.  Start Xanax as needed 9. Type 2 diabetes mellitus on Lantus and sliding scale 10. Weakness.  Patient must walk with physical therapy 11. Soreness left neck.  PRN oxycodone for today.  Careful with this medication.      Code Status:     Code Status Orders  (From admission, onward)        Start     Ordered   03/24/17 1951  Full code  Continuous     03/24/17 1952    Code Status History    Date Active Date Inactive Code Status Order ID Comments User Context   03/03/2017 14:20 03/10/2017 21:31 Full Code [id number]  Fritzi Mandes, MD ED   01/07/2017 17:25 01/10/2017 19:00 Full Code [id number]  Demetrios Loll, MD Inpatient   11/08/2016 04:50 11/16/2016 17:17 Full Code [id number]  [name] Bridge, DO Inpatient   06/25/2016 14:47 06/29/2016 18:15 Full Code [id number]  [name] Bow, MD ED   08/09/2015 17:57 08/13/2015 17:21 Full Code [id number]  [name] Costa, MD Inpatient   12/31/2014 20:25 01/03/2015 17:49 Full Code [id number]  [name] Butte, MD ED   12/15/2014 11:54 12/19/2014  16:57 Full Code [id number]  [name] Bow, MD ED     Family Communication: Husband yesterday Disposition Plan:  if patient is able to sit up with dialysis and tolerated well then hopefully she can go out to rehab once an outpatient dialysis slot is obtained.  Consultants:  Nephrology  Vascular surgery  Time spent: 24 minutes.  Kaelob Persky [employer]

## 2017-04-16 NOTE — Progress Notes (Signed)
PT Cancellation Note  Patient Details Name: [name] MRN: [id number] DOB: May 22, 1970   Cancelled Treatment:    Reason Eval/Treat Not Completed: Patient at procedure or test/unavailable; Pt out for surgical procedure in AM and out for HD in PM, will attempt to see pt at a future date as medically appropriate.    Linus Salmons PT, DPT 04/16/17, 4:16 PM

## 2017-04-16 NOTE — Transfer of Care (Signed)
Immediate Anesthesia Transfer of Care Note  Patient: [name]  Procedure(s) Performed: INSERTION OF DIALYSIS PERM CATHETER (N/A )  Patient Location: PACU  Anesthesia Type:MAC  Level of Consciousness: awake  Airway & Oxygen Therapy: Patient Spontanous Breathing  Post-op Assessment: Report given to RN  Post vital signs: Reviewed and stable  Last Vitals:  Vitals:   04/16/17 0857 04/16/17 1030  BP: (!) 147/77 (!) 151/81  Pulse: 98 94  Resp:  14  Temp: 36.7 C (!) 35.9 C  SpO2: 100% 96%    Last Pain:  Vitals:   04/16/17 1030  TempSrc: Tympanic  PainSc: 6       Patients Stated Pain Goal: 4 (97/67/34 1937)  Complications: No apparent anesthesia complications

## 2017-04-16 NOTE — Progress Notes (Signed)
[facility], [place] 04/16/17  Subjective:  Unfortunately patient's renal function continues to be quite low. Urine output not recorded.  Patient feels that her urine output has improved some with IV Lasix  Today she is seen during dialysis She received new PermCath today She has agreed to continue chronic dialysis Reports feeling anxious   Objective:  Vital signs in last 24 hours:  Temp:  [96.7 F (35.9 C)-98.6 F (37 C)] 98.2 F (36.8 C) (02/20 1430) Pulse Rate:  [93-111] 93 (02/20 1800) Resp:  [13-26] 22 (02/20 1800) BP: (125-163)/(59-117) 163/72 (02/20 1800) SpO2:  [94 %-100 %] 100 % (02/20 1800) Weight:  [199.6 kg (439 lb 15.9 oz)-200 kg (440 lb 14.7 oz)] 199.6 kg (439 lb 15.9 oz) (02/20 1430)  Weight change: -1 kg (-3.3 oz) Filed Weights   04/16/17 0443 04/16/17 1030 04/16/17 1430  Weight: (!) 200 kg (440 lb 14.7 oz) (!) 199.6 kg (440 lb) (!) 199.6 kg (439 lb 15.9 oz)    Intake/Output:    Intake/Output Summary (Last 24 hours) at 04/16/2017 1823 Last data filed at 04/16/2017 1230 Gross per 24 hour  Intake 200 ml  Output 300 ml  Net -100 ml     Physical Exam: General: Laying in bariatric recliner chair  HEENT Normocephalic and atraumatic.  Neck supple  Pulm/lungs Clear b/l, normal effort  CVS/Heart RRR no obvious rub  Abdomen:  BS present, distension noted  Extremities: 2+ b/l lower extremity edema  Neurologic: Alert, oriented, able to answer questions  Skin: Venous stasis changes noted b/l lower extremities, + dependent edema over abdomen  Access:  L IJ Tunneled dialysis cathter       Basic Metabolic Panel:  Recent Labs  Lab 04/09/17 2240 04/10/17 0547 04/11/17 0907 04/15/17 0550  NA  --  138  --  138  K  --  4.4  --  4.7  CL  --  94*  --  94*  CO2  --  35*  --  32  GLUCOSE  --  215*  --  229*  BUN  --  58*  --  65*  CREATININE  --  3.00*  --  4.17*  CALCIUM  --  8.6*  --  8.7*  PHOS 4.8*  --  5.0* 5.2*      CBC: Recent Labs  Lab 04/12/17 0459 04/16/17 1100  WBC 7.1 7.2  HGB 8.1* 8.3*  HCT 27.1* 27.2*  MCV 87.9 87.2  PLT 347 349      Lab Results  Component Value Date   HEPBSAG Negative 04/04/2017      Microbiology:  No results found for this or any previous visit (from the past 240 hour(s)).  Coagulation Studies: No results for input(s): LABPROT, INR in the last 72 hours.  Urinalysis: No results for input(s): COLORURINE, LABSPEC, PHURINE, GLUCOSEU, HGBUR, BILIRUBINUR, KETONESUR, PROTEINUR, UROBILINOGEN, NITRITE, LEUKOCYTESUR in the last 72 hours.  Invalid input(s): APPERANCEUR    Imaging: Dg C-arm 1-60 Min-no Report  Result Date: 04/16/2017 Fluoroscopy was utilized by the requesting physician.  No radiographic interpretation.     Medications:   . sodium chloride     . aspirin EC  81 mg Oral Daily  . atorvastatin  80 mg Oral q1800  . feeding supplement (NEPRO CARB STEADY)  237 mL Oral TID WC  . heparin  5,000 Units Subcutaneous Q8H  . hydrocerin   Topical BID  . insulin aspart  0-15 Units Subcutaneous TID WC  . insulin aspart  0-5 Units Subcutaneous QHS  . insulin aspart  3 Units Subcutaneous TID WC  . insulin glargine  12 Units Subcutaneous QHS  . metoprolol tartrate  50 mg Oral BID  . multivitamin  1 tablet Oral QHS  . multivitamin with minerals  1 tablet Oral Daily  . polyethylene glycol  17 g Oral Daily  . sodium chloride  2 spray Each Nare QID  . sodium chloride flush  3 mL Intravenous Q12H  . sodium chloride flush      . tuberculin  5 Units Intradermal Once   sodium chloride, acetaminophen **OR** acetaminophen, ALPRAZolam, cyclobenzaprine, diphenhydrAMINE, ipratropium-albuterol, lactulose, ondansetron **OR** ondansetron (ZOFRAN) IV, oxyCODONE, sodium chloride flush  Assessment/ Plan:  47 y.o. African American morbidly obese female with h/o diastolic congestive heart failure, chronic kidney disease stage 3-4, HTN, DM, iron deficiency anemia,  medication and treatment noncompliance admitted following appointment at the [facility] when she presented with diffuse edema and pain most notable in b/l lower extremities and abdomen.   1. Acute renal failure on chronic kidney disease stage 3 likely progressed to end-stage renal disease - Baseline Cr 1.57 (12/2016)/ GFR 45 -  Admit Cr 2.65--> 2.78 --> 3.0 (2/7).  - 24 hr urine Creatinine clearance is 16 cc/min (04/04/2017) -Tunneled dialysis catheter has been placed Start discharge planning for outpatient unit. Patient was able to sit and bariatric dialysis chair today but she will need to be ready for transfer from her wheelchair to the outpatient dialysis chair  2. Acute on chronic diastolic congestive heart failure (grade 1 diastolic dysfunction) with [place] 2018 Cardiac studies: Normal LVEF with mild to moderate MR and grade 1 diastolic dysfunction. Mildly dilated LV with EF ~55% and normal wall motion.  -Continue to perform perform hemodialysis with ultrafiltration.   Continue to remove volume as tolerated  3. DM-2 with CKD - A1C 13.2 (11/2014) --> 7.3 (02/2017) - Blood glucose management as per hospitalist.  4.  Disposition PPD placed 2/19    LOS: Daniel 2/20/20196:23 [place], [place]

## 2017-04-16 NOTE — H&P (Signed)
[facility] History & Physical Update  The patient was interviewed and re-examined.  The patient's previous History and Physical has been reviewed and is unchanged. She has not returned her renal function and needs her temp cath turned to a tunneled catheter. There is no change in the plan of care. We plan to proceed with the scheduled procedure.  [name], MD  04/16/2017, 11:21 AM

## 2017-04-16 NOTE — Progress Notes (Signed)
Post HD assessment  

## 2017-04-16 NOTE — Progress Notes (Addendum)
Pt. Refused BIPAP

## 2017-04-16 NOTE — Anesthesia Postprocedure Evaluation (Signed)
Anesthesia Post Note  Patient: LITTLE BASHORE  Procedure(s) Performed: INSERTION OF DIALYSIS PERM CATHETER (N/A )  Patient location during evaluation: PACU Anesthesia Type: MAC Level of consciousness: awake and alert and oriented Pain management: pain level controlled Vital Signs Assessment: post-procedure vital signs reviewed and stable Respiratory status: spontaneous breathing, nonlabored ventilation and respiratory function stable Cardiovascular status: blood pressure returned to baseline and stable Postop Assessment: no signs of nausea or vomiting Anesthetic complications: no     Last Vitals:  Vitals:   04/16/17 1248 04/16/17 1250  BP: (!) 133/59   Pulse: 95 95  Resp: (!) 21 (!) 22  Temp:  (!) 36.3 C  SpO2: 95% 95%    Last Pain:  Vitals:   04/16/17 1250  TempSrc:   PainSc: 0-No pain                 Myshawn Chir[place]oga

## 2017-04-16 NOTE — Anesthesia Post-op Follow-up Note (Signed)
Anesthesia QCDR form completed.        

## 2017-04-16 NOTE — Progress Notes (Signed)
HD tx end  

## 2017-04-16 NOTE — Anesthesia Procedure Notes (Signed)
Date/Time: 04/16/2017 12:06 PM Performed by: Nelda Marseille, CRNA Pre-anesthesia Checklist: Patient identified, Emergency Drugs available, Suction available and Patient being monitored Oxygen Delivery Method: Simple face mask

## 2017-04-16 NOTE — Op Note (Signed)
OPERATIVE NOTE    PRE-OPERATIVE DIAGNOSIS: 1. ESRD 2. Morbid obesity  POST-OPERATIVE DIAGNOSIS: same as above  PROCEDURE: 1. Fluoroscopic guidance for placement of catheter 2. Placement of a 27 cm tip to cuff tunneled hemodialysis catheter via the left internal jugular vein  SURGEON: [name], MD  ANESTHESIA:  MAC  ESTIMATED BLOOD LOSS: 10 cc  FLUORO TIME: less than one minute  CONTRAST: none  FINDING(S): 1.  None  SPECIMEN(S):  None  INDICATIONS:   [name] is a 47 y.o. female who presents with renal failure that has not improved and is now felt to be ESRD.  The patient needs long term dialysis access for their ESRD, and a Permcath is necessary.  Risks and benefits are discussed and informed consent is obtained.    DESCRIPTION: After obtaining full informed written consent, the patient was brought back to the vascular suited. The patient's left neck and chest and existing catheter were sterilely prepped and draped in a sterile surgical field was created.  A wire was placed through the existing temporary catheter and this temporary catheter was removed. After skin nick and dilatation, the peel-away sheath was placed over the wire. I then turned my attention to an area under the clavicle. Approximately 1-2 fingerbreadths below the clavicle a small counterincision was created and tunneled from the subclavicular incision to the access site. Using fluoroscopic guidance, a 27 centimeter tip to cuff tunneled hemodialysis catheter was selected, and tunneled from the subclavicular incision to the access site. It was then placed through the peel-away sheath and the peel-away sheath was removed. Using fluoroscopic guidance the catheter tips were parked in the right atrium. The appropriate distal connectors were placed. It withdrew blood well and flushed easily with heparinized saline and a concentrated heparin solution was then placed. It was secured to the chest wall with 2 Prolene  sutures. The access incision was closed single 4-0 Monocryl. A 4-0 Monocryl pursestring suture was placed around the exit site. Sterile dressings were placed. The patient tolerated the procedure well and was taken to the recovery room in stable condition.  COMPLICATIONS: None  CONDITION: Stable  [name]  04/16/2017, 12:27 PM   This note was created with Dragon Medical transcription system. Any errors in dictation are purely unintentional.

## 2017-04-17 LAB — GLUCOSE, CAPILLARY
GLUCOSE-CAPILLARY: 125 mg/dL — AB (ref 65–99)
Glucose-Capillary: 112 mg/dL — ABNORMAL HIGH (ref 65–99)
Glucose-Capillary: 144 mg/dL — ABNORMAL HIGH (ref 65–99)
Glucose-Capillary: 174 mg/dL — ABNORMAL HIGH (ref 65–99)

## 2017-04-17 MED ORDER — CLONIDINE HCL 0.1 MG PO TABS
0.2000 mg | ORAL_TABLET | Freq: Two times a day (BID) | ORAL | Status: DC
  Administered 2017-04-18 (×2): 0.2 mg via ORAL
  Filled 2017-04-17 (×2): qty 2

## 2017-04-17 NOTE — Clinical Social Work Note (Signed)
Patient is currently agreeing to go to SNF, CSW was given permission to begin bed search again.  CSW awaiting bed offers.  Jones Broom. Marvell, MSW, [place]  04/17/2017 6:08 PM

## 2017-04-17 NOTE — Progress Notes (Signed)
Called the on-call psychiatrist per request of case manager several hours ago, no response as of yet. Will continue to monitor. Wenda Low [facility]

## 2017-04-17 NOTE — Care Management (Addendum)
PPD negative.  Patient did sit for dialysis today and reached out to attending regarding met b. Discussed psych reassessment due to frequent refusal to participate in care.

## 2017-04-17 NOTE — Progress Notes (Signed)
Read patient's PPD site on R forearm, as requested by case management prior to shift end. Completely negative. Wenda Low [facility]

## 2017-04-17 NOTE — Progress Notes (Signed)
HD started. 

## 2017-04-17 NOTE — Progress Notes (Signed)
Patient ID: [name], female   DOB: 1970-08-28, 47 y.o.   MRN: [id number]  [employer] PROGRESS NOTE  [name] YQI:[id number] DOB: [birth year] DOA: 03/24/2017 PCP: [name], NP  HPI/Subjective: Seen at HD, now she would like to be placed at facility (what she tells me)  Objective: Vitals:   04/17/17 1545 04/17/17 1600  BP: 137/75 117/68  Pulse: 83 83  Resp: 12 12  Temp:    SpO2: 100% 100%    Filed Weights   04/16/17 1030 04/16/17 1430 04/17/17 0548  Weight: (!) 199.6 kg (440 lb) (!) 199.6 kg (439 lb 15.9 oz) (!) 179.6 kg (396 lb)    ROS: Review of Systems  Constitutional: Negative for chills and fever.  Eyes: Negative for blurred vision.  Respiratory: Positive for shortness of breath. Negative for cough.   Cardiovascular: Negative for chest pain.  Gastrointestinal: Negative for abdominal pain, constipation, diarrhea, nausea and vomiting.  Genitourinary: Negative for dysuria.  Musculoskeletal: Negative for joint pain.  Neurological: Negative for dizziness and headaches.   Exam: Physical Exam  Constitutional: She is oriented to person, place, and time.  HENT:  Nose: No mucosal edema.  Mouth/Throat: No oropharyngeal exudate or posterior oropharyngeal edema.  Eyes: Conjunctivae, EOM and lids are normal. Pupils are equal, round, and reactive to light.  Neck: No JVD present. Carotid bruit is not present. No edema present. No thyroid mass and no thyromegaly present.  Cardiovascular: S1 normal and S2 normal. Exam reveals no gallop.  No murmur heard. Pulses:      Dorsalis pedis pulses are 2+ on the right side, and 2+ on the left side.  Respiratory: No respiratory distress. She has decreased breath sounds in the right lower field and the left lower field. She has no wheezes. She has no rhonchi. She has no rales.  GI: Soft. Bowel sounds are normal. She exhibits distension. There is no tenderness.  Musculoskeletal:       Right ankle: She exhibits swelling.        Left ankle: She exhibits swelling.  Lymphadenopathy:    She has no cervical adenopathy.  Neurological: She is alert and oriented to person, place, and time. No cranial nerve deficit.  Skin: Skin is warm. Nails show no clubbing.  Slight bleeding at the tunneled catheter site dressing.  Chronic lower extremity discoloration  Psychiatric: She has a normal mood and affect.      Data Reviewed: Basic Metabolic Panel: Recent Labs  Lab 04/11/17 0907 04/15/17 0550  NA  --  138  K  --  4.7  CL  --  94*  CO2  --  32  GLUCOSE  --  229*  BUN  --  65*  CREATININE  --  4.17*  CALCIUM  --  8.7*  PHOS 5.0* 5.2*   CBC: Recent Labs  Lab 04/12/17 0459 04/16/17 1100  WBC 7.1 7.2  HGB 8.1* 8.3*  HCT 27.1* 27.2*  MCV 87.9 87.2  PLT 347 349   BNP (last 3 results) Recent Labs    01/07/17 1039 03/03/17 1114 03/27/17 0702  BNP 456.0* 617.0* 577.0*     CBG: Recent Labs  Lab 04/16/17 1244 04/16/17 2049 04/17/17 0028 04/17/17 0810 04/17/17 1135  GLUCAP 118* 105* 144* 112* 125*     Scheduled Meds: . aspirin EC  81 mg Oral Daily  . atorvastatin  80 mg Oral q1800  . [START ON 04/18/2017] cloNIDine  0.2 mg Oral BID  . feeding supplement (NEPRO CARB  STEADY)  237 mL Oral TID WC  . heparin  5,000 Units Subcutaneous Q8H  . hydrocerin   Topical BID  . insulin aspart  0-15 Units Subcutaneous TID WC  . insulin aspart  0-5 Units Subcutaneous QHS  . insulin aspart  3 Units Subcutaneous TID WC  . insulin glargine  12 Units Subcutaneous QHS  . metoprolol tartrate  50 mg Oral BID  . multivitamin  1 tablet Oral QHS  . multivitamin with minerals  1 tablet Oral Daily  . polyethylene glycol  17 g Oral Daily  . sodium chloride  2 spray Each Nare QID  . sodium chloride flush  3 mL Intravenous Q12H  . tuberculin  5 Units Intradermal Once   Continuous Infusions: . sodium chloride      Assessment/Plan:  1. Acute on chronic hypoxic hypercarbic respiratory failure secondary to acute  CHF exacerbation.  Patient currently on 4 L of oxygen.   2. Acute on chronic diastolic congestive heart failure.  Dialysis to help out with fluid management.  Continue metoprolol. 3. Acute kidney injury on chronic kidney disease stage III.  Nephrology has now declared her end-stage.  Dialysis currently needed for fluid management.  Patient needs an outpatient slot.  Patient seems to be sitting up in a chair that is reclined at dialysis currently. Tunneled dialysis catheter has been placed Patient was able to sit and bariatric dialysis chair today but she will need to be ready for transfer from her wheelchair to the outpatient dialysis chair 4. Morbid obesity.  Likely has sleep apnea underlying also.  Recommend outpatient sleep study. 5. Constipation on MiraLAX 6. Essential hypertension on metoprolol 7. Hyperlipidemia unspecified on atorvastatin 8.  anxiety.  Patient states that she does not want the Cymbalta.  Start Xanax as needed 9. Type 2 diabetes mellitus on Lantus and sliding scale 10. Weakness.  Patient must walk with physical therapy, she is considering placement now (what she told me) 11. Soreness left neck.  PRN oxycodone     Code Status:     Code Status Orders  (From admission, onward)        Start     Ordered   03/24/17 1951  Full code  Continuous     03/24/17 1952    Code Status History    Date Active Date Inactive Code Status Order ID Comments User Context   03/03/2017 14:20 03/10/2017 21:31 Full Code [id number]  Fritzi Mandes, MD ED   01/07/2017 17:25 01/10/2017 19:00 Full Code [id number]  Demetrios Loll, MD Inpatient   11/08/2016 04:50 11/16/2016 17:17 Full Code [id number]  Harvie Bridge, DO Inpatient   06/25/2016 14:47 06/29/2016 18:15 Full Code [id number]  Hillary Bow, MD ED   08/09/2015 17:57 08/13/2015 17:21 Full Code [id number]  Bettey Costa, MD Inpatient   12/31/2014 20:25 01/03/2015 17:49 Full Code [id number]  Lytle Butte, MD ED   12/15/2014 11:54 12/19/2014 16:57 Full Code  [id number]  Hillary Bow, MD ED     Family Communication: Husband yesterday Disposition Plan:  if patient is able to sit up with dialysis and tolerated well then hopefully she can go out to rehab once an outpatient dialysis slot is obtained.  Consultants:  Nephrology  Vascular surgery  Time spent: 24 minutes.  Ivan Lacher [employer]

## 2017-04-17 NOTE — Progress Notes (Signed)
[facility], [place] 04/17/17  Subjective:  Unfortunately patient's renal function continues to be quite low. Urine output not recorded.  Patient feels that her urine output has improved some with IV Lasix  Today she is seen during dialysis Dialyzing seated in chair   Objective:  Vital signs in last 24 hours:  Temp:  [97.4 F (36.3 C)-98.3 F (36.8 C)] 97.9 F (36.6 C) (02/21 0755) Pulse Rate:  [82-118] 83 (02/21 1415) Resp:  [0-26] 0 (02/21 1415) BP: (124-172)/(70-155) 141/86 (02/21 1415) SpO2:  [87 %-100 %] 99 % (02/21 1415) Weight:  [179.6 kg (396 lb)] 179.6 kg (396 lb) (02/21 0548)  Weight change: -0.417 kg (-14.7 oz) Filed Weights   04/16/17 1030 04/16/17 1430 04/17/17 0548  Weight: (!) 199.6 kg (440 lb) (!) 199.6 kg (439 lb 15.9 oz) (!) 179.6 kg (396 lb)    Intake/Output:    Intake/Output Summary (Last 24 hours) at 04/17/2017 1445 Last data filed at 04/17/2017 0854 Gross per 24 hour  Intake -  Output 5875 ml  Net -5875 ml     Physical Exam: General: Laying in bariatric recliner chair  HEENT Normocephalic and atraumatic.  Neck supple  Pulm/lungs Clear b/l, normal effort  CVS/Heart RRR no obvious rub  Abdomen:  BS present, distension noted  Extremities: 2+ b/l lower extremity edema  Neurologic: Alert, oriented, able to answer questions  Skin: Venous stasis changes noted b/l lower extremities, + dependent edema over abdomen  Access:  L IJ Tunneled dialysis cathter       Basic Metabolic Panel:  Recent Labs  Lab 04/11/17 0907 04/15/17 0550  NA  --  138  K  --  4.7  CL  --  94*  CO2  --  32  GLUCOSE  --  229*  BUN  --  65*  CREATININE  --  4.17*  CALCIUM  --  8.7*  PHOS 5.0* 5.2*     CBC: Recent Labs  Lab 04/12/17 0459 04/16/17 1100  WBC 7.1 7.2  HGB 8.1* 8.3*  HCT 27.1* 27.2*  MCV 87.9 87.2  PLT 347 349      Lab Results  Component Value Date   HEPBSAG Negative 04/04/2017      Microbiology:  No  results found for this or any previous visit (from the past 240 hour(s)).  Coagulation Studies: No results for input(s): LABPROT, INR in the last 72 hours.  Urinalysis: No results for input(s): COLORURINE, LABSPEC, PHURINE, GLUCOSEU, HGBUR, BILIRUBINUR, KETONESUR, PROTEINUR, UROBILINOGEN, NITRITE, LEUKOCYTESUR in the last 72 hours.  Invalid input(s): APPERANCEUR    Imaging: Dg C-arm 1-60 Min-no Report  Result Date: 04/16/2017 Fluoroscopy was utilized by the requesting physician.  No radiographic interpretation.     Medications:   . sodium chloride     . aspirin EC  81 mg Oral Daily  . atorvastatin  80 mg Oral q1800  . feeding supplement (NEPRO CARB STEADY)  237 mL Oral TID WC  . heparin  5,000 Units Subcutaneous Q8H  . hydrocerin   Topical BID  . insulin aspart  0-15 Units Subcutaneous TID WC  . insulin aspart  0-5 Units Subcutaneous QHS  . insulin aspart  3 Units Subcutaneous TID WC  . insulin glargine  12 Units Subcutaneous QHS  . metoprolol tartrate  50 mg Oral BID  . multivitamin  1 tablet Oral QHS  . multivitamin with minerals  1 tablet Oral Daily  . polyethylene glycol  17 g Oral Daily  . sodium  chloride  2 spray Each Nare QID  . sodium chloride flush  3 mL Intravenous Q12H  . tuberculin  5 Units Intradermal Once   sodium chloride, acetaminophen **OR** acetaminophen, ALPRAZolam, cyclobenzaprine, diphenhydrAMINE, ipratropium-albuterol, lactulose, ondansetron **OR** ondansetron (ZOFRAN) IV, oxyCODONE, sodium chloride flush  Assessment/ Plan:  47 y.o. African American morbidly obese female with h/o diastolic congestive heart failure, chronic kidney disease stage 3-4, HTN, DM, iron deficiency anemia, medication and treatment noncompliance admitted following appointment at the [facility] when she presented with diffuse edema and pain most notable in b/l lower extremities and abdomen.   1. Acute renal failure on chronic kidney disease stage 3 likely progressed to  end-stage renal disease - Baseline Cr 1.57 (12/2016)/ GFR 45 -  Admit Cr 2.65--> 2.78 --> 3.0 (2/7).  - 24 hr urine Creatinine clearance is 16 cc/min (04/04/2017) -Tunneled dialysis catheter has been placed Start discharge planning for outpatient unit. Patient was able to sit and bariatric dialysis chair today but she will need to be ready for transfer from her wheelchair to the outpatient dialysis chair 5500 cc removed with HD on 2/21 Goal 5-6 kg today in SEQ mode  2. Acute on chronic diastolic congestive heart failure (grade 1 diastolic dysfunction) with [place] 2018 Cardiac studies: Normal LVEF with mild to moderate MR and grade 1 diastolic dysfunction. Mildly dilated LV with EF ~55% and normal wall motion.  -Continue to perform perform hemodialysis with ultrafiltration.   Continue to remove volume as tolerated  3. DM-2 with CKD - A1C 13.2 (11/2014) --> 7.3 (02/2017) - Blood glucose management as per hospitalist.  4.  Disposition PPD placed 2/19    LOS: [place] 2/21/20192:45 [place], [place]

## 2017-04-17 NOTE — Plan of Care (Signed)
  Health Behavior/Discharge Planning: Ability to manage health-related needs will improve 04/17/2017 1342 - Progressing by Daron Offer, RN Note Patient finally agreeable to both go to HD today and to sit for the treatment. Will continue to monitor. Wenda Low [facility]

## 2017-04-17 NOTE — NC FL2 (Addendum)
[place] LEVEL OF CARE SCREENING TOOL     IDENTIFICATION  Patient Name: [name] Birthdate: May 07, 1970 Sex: female Admission Date (Current Location): 03/24/2017  [place] and [place] Number:  [name] [id number] [place] and Address:  [facility], [address] [place], [place] [address]      Provider Number: [id number]  Attending Physician Name and Address:  Max Sane, MD  Relative Name and Phone Number:  [name], [name] [phone]     Current Level of Care: Hospital Recommended Level of Care: [place] Prior Approval Number:    Date Approved/Denied:   PASRR Number: [id number]  Discharge Plan: SNF    Current Diagnoses: Patient Active Problem List   Diagnosis Date Noted  . Palliative care by specialist   . Goals of care, counseling/discussion   . ESRD (end stage renal disease) ([place])   . DNR (do not resuscitate) discussion   . Stage 4 chronic kidney disease ([place])   . Adjustment disorder with mixed anxiety and depressed mood 03/31/2017  . Dysthymia 03/31/2017  . Chronic pain syndrome 03/31/2017  . Palliative care encounter   . Obesity hypoventilation syndrome ([place]) 03/09/2017  . CHF (congestive heart failure) ([place]) 03/03/2017  . Chronic diastolic heart failure ([place]) 01/07/2017  . Pneumonia 01/07/2017  . Acute exacerbation of CHF (congestive heart failure) ([place]) 11/07/2016  . Depression 09/24/2016  . Acute on chronic diastolic heart failure ([place]) 07/04/2016  . COPD with chronic bronchitis ([place]) 07/04/2016  . CKD (chronic kidney disease), stage III ([place]) 06/26/2016  . COPD exacerbation ([place]) 03/25/2016  . Anxiety 10/02/2015  . Asthma 08/30/2015  . Poorly controlled type 2 diabetes mellitus ([place]) 03/13/2015  . Iron deficiency anemia 02/17/2015  . Obesity, morbid ([place])   . Shortness of breath   . Essential hypertension   . Hypertensive heart disease with congestive heart failure ([place])  12/18/2014  . Diabetes ([place]) 12/15/2014    Orientation RESPIRATION BLADDER Height & Weight     Self, Time, Situation, Place  O2(4L) Continent Weight: (!) 396 lb (179.6 kg) Height:  5\' 4"  (162.6 cm)  BEHAVIORAL SYMPTOMS/MOOD NEUROLOGICAL BOWEL NUTRITION STATUS      Continent Diet(Renal Carb Modified)  AMBULATORY STATUS COMMUNICATION OF NEEDS Skin   Extensive Assist Verbally Surgical wounds                       Personal Care Assistance Level of Assistance  Bathing, Feeding, Dressing Bathing Assistance: Limited assistance Feeding assistance: Independent Dressing Assistance: Limited assistance     Functional Limitations Info  Sight, Hearing, Speech Sight Info: Adequate Hearing Info: Adequate Speech Info: Adequate    SPECIAL CARE FACTORS FREQUENCY  PT (By licensed PT)     PT Frequency: 5x a week OT Frequency: 5x a week            Contractures Contractures Info: Not present    Additional Factors Info  Code Status, Allergies, Insulin Sliding Scale, Isolation Precautions Code Status Info: Full Code Allergies Info: DUST MITE EXTRACT, MOLD EXTRACT TRICHOPHYTON, FERAHEME FERUMOXYTOL, POLLEN EXTRACT, SULFA ANTIBIOTICS  Psychotropic Info: citalopram (CELEXA) tablet 40 mg  Insulin Sliding Scale Info: insulin aspart (novoLOG) injection 0-15 Units 3x a day with meals Isolation Precautions Info: Contact precautions for MRSA.     Current Medications (04/17/2017):  This is the current hospital active medication list Current Facility-Administered Medications  Medication Dose Route Frequency Provider Last Rate Last Dose  . 0.9 %  sodium chloride infusion  250 mL  Intravenous PRN Hillary Bow, MD      . acetaminophen (TYLENOL) tablet 650 mg  650 mg Oral Q6H PRN Hillary Bow, MD   650 mg at 04/17/17 8850   Or  . acetaminophen (TYLENOL) suppository 650 mg  650 mg Rectal Q6H PRN Hillary Bow, MD      . ALPRAZolam Duanne Moron) tablet 0.25 mg  0.25 mg Oral TID PRN Loletha Grayer,  MD   0.25 mg at 04/17/17 1210  . aspirin EC tablet 81 mg  81 mg Oral Daily Hillary Bow, MD   81 mg at 04/17/17 0943  . atorvastatin (LIPITOR) tablet 80 mg  80 mg Oral q1800 Hillary Bow, MD   80 mg at 04/16/17 2115  . [START ON 04/18/2017] cloNIDine (CATAPRES) tablet 0.2 mg  0.2 mg Oral BID Max Sane, MD      . cyclobenzaprine (FLEXERIL) tablet 7.5 mg  7.5 mg Oral TID PRN Lance Coon, MD   7.5 mg at 04/16/17 1554  . diphenhydrAMINE (BENADRYL) capsule 25 mg  25 mg Oral Q8H PRN Hillary Bow, MD   25 mg at 04/16/17 2115  . feeding supplement (NEPRO CARB STEADY) liquid 237 mL  237 mL Oral TID WC Mody, Sital, MD   237 mL at 04/17/17 0806  . heparin injection 5,000 Units  5,000 Units Subcutaneous Q8H Sudini, Alveta Heimlich, MD   5,000 Units at 04/16/17 2115  . hydrocerin (EUCERIN) cream   Topical BID Dellinger, Haynes Dage L, PA-C      . insulin aspart (novoLOG) injection 0-15 Units  0-15 Units Subcutaneous TID WC Hillary Bow, MD   2 Units at 04/16/17 [phone]  . insulin aspart (novoLOG) injection 0-5 Units  0-5 Units Subcutaneous QHS Hillary Bow, MD   2 Units at 04/09/17 2326  . insulin aspart (novoLOG) injection 3 Units  3 Units Subcutaneous TID WC Demetrios Loll, MD   3 Units at 04/15/17 1756  . insulin glargine (LANTUS) injection 12 Units  12 Units Subcutaneous QHS Hillary Bow, MD   12 Units at 04/16/17 2115  . ipratropium-albuterol (DUONEB) 0.5-2.5 (3) MG/3ML nebulizer solution 3 mL  3 mL Nebulization Q4H PRN Murlean Iba, MD   3 mL at 04/17/17 1654  . lactulose (CHRONULAC) 10 GM/15ML solution 20 g  20 g Oral Daily PRN Loletha Grayer, MD   20 g at 04/13/17 0913  . metoprolol tartrate (LOPRESSOR) tablet 50 mg  50 mg Oral BID Murlean Iba, MD   50 mg at 04/17/17 0943  . multivitamin (RENA-VIT) tablet 1 tablet  1 tablet Oral QHS [name], MD   1 tablet at 04/16/17 2115  . multivitamin with minerals tablet 1 tablet  1 tablet Oral Daily Bettey Costa, MD   1 tablet at 04/17/17 0943  .  ondansetron (ZOFRAN) tablet 4 mg  4 mg Oral Q6H PRN Hillary Bow, MD       Or  . ondansetron (ZOFRAN) injection 4 mg  4 mg Intravenous Q6H PRN Sudini, Srikar, MD      . oxyCODONE (Oxy IR/ROXICODONE) immediate release tablet 5 mg  5 mg Oral Q6H PRN Loletha Grayer, MD   5 mg at 04/17/17 1650  . polyethylene glycol (MIRALAX / GLYCOLAX) packet 17 g  17 g Oral Daily Loletha Grayer, MD   17 g at 04/14/17 0855  . sodium chloride (OCEAN) 0.65 % nasal spray 2 spray  2 spray Each Nare QID Amelia Jo, MD   2 spray at 04/17/17 0945  . sodium chloride flush (NS) 0.9 %  injection 3 mL  3 mL Intravenous Q12H Hillary Bow, MD   3 mL at 04/17/17 0944  . sodium chloride flush (NS) 0.9 % injection 3 mL  3 mL Intravenous PRN Hillary Bow, MD         Discharge Medications: Please see discharge summary for a list of discharge medications.  Relevant Imaging Results:  Relevant Lab Results:   Additional Information SSN [id number] Patient receives dialysis now and is set up at [facility] in [place], [place], [place]

## 2017-04-17 NOTE — Progress Notes (Signed)
HD complete 

## 2017-04-17 NOTE — Progress Notes (Signed)
PT Cancellation Note  Patient Details Name: [name] MRN: [id number] DOB: 03-Nov-1970   Cancelled Treatment:    Reason Eval/Treat Not Completed: Patient declined, no reason specified   Pt offered session this am but she refused.  Stating she was going to sit up and eat but wanted to rest first.  Will reschedule as time allows.   Chesley Noon 04/17/2017, 12:10 PM

## 2017-04-17 NOTE — Progress Notes (Signed)
Pre HD  

## 2017-04-17 NOTE — Care Management (Signed)
CM confirmed patient did sit for her dialysis yesterday. CM was informed this morning that patient has refused dialysis today. CM spoke with patient and she relayed that no one told her she was going to have to have dialysis today.   Discussed that often it is decided on a day to day basis.  Discuss that she is now going to have to have dialysis long term and she verbalizes understanding.  Discussed at present the only thing that is prolonging hospitalization is the need for dialysis.  Discussed that is she was to pursue dialysis, she needed to commit to what will be need in order for her to receive it.  she verbalizes she is in agreement with SNF placement - even [place] of county [place] one [place].  CM informed that if a bed is found and she declines she will discharge home.  Outpatient dialysis clinic has been assigned- [facility] in [place] and patient would have to transport by ems.  CM was informed by Estill Bamberg with [facility] that patient stated the facility that has offered a bed has 3 stars and she is not going.  There has not been a new bed search initiated.  [facility] delivered a [facility] bed to patient's home several weeks ago but patient never discharge.  Checking to see if it is still in place.  Informed primary nurse that PPD placed 04/15/2017  needs to be read later this evening.

## 2017-04-17 NOTE — Consult Note (Signed)
  Psychiatry: Consult received for reevaluation.  Patient was in hemodialysis much of the afternoon and not available for consult.  I will follow up tomorrow.

## 2017-04-18 LAB — CBC
HEMATOCRIT: 27.2 % — AB (ref 35.0–47.0)
HEMOGLOBIN: 8.5 g/dL — AB (ref 12.0–16.0)
MCH: 26.8 pg (ref 26.0–34.0)
MCHC: 31.1 g/dL — AB (ref 32.0–36.0)
MCV: 86.3 fL (ref 80.0–100.0)
Platelets: 356 10*3/uL (ref 150–440)
RBC: 3.15 MIL/uL — ABNORMAL LOW (ref 3.80–5.20)
RDW: 16.8 % — AB (ref 11.5–14.5)
WBC: 8.2 10*3/uL (ref 3.6–11.0)

## 2017-04-18 LAB — GLUCOSE, CAPILLARY
GLUCOSE-CAPILLARY: 144 mg/dL — AB (ref 65–99)
Glucose-Capillary: 159 mg/dL — ABNORMAL HIGH (ref 65–99)
Glucose-Capillary: 154 mg/dL — ABNORMAL HIGH (ref 65–99)

## 2017-04-18 LAB — BASIC METABOLIC PANEL
Anion gap: 10 (ref 5–15)
BUN: 72 mg/dL — AB (ref 6–20)
CALCIUM: 8.5 mg/dL — AB (ref 8.9–10.3)
CHLORIDE: 95 mmol/L — AB (ref 101–111)
CO2: 32 mmol/L (ref 22–32)
CREATININE: 4.3 mg/dL — AB (ref 0.44–1.00)
GFR calc Af Amer: 13 mL/min — ABNORMAL LOW (ref >60)
GFR calc non Af Amer: 11 mL/min — ABNORMAL LOW (ref >60)
GLUCOSE: 199 mg/dL — AB (ref 65–99)
Potassium: 4.7 mmol/L (ref 3.5–5.1)
Sodium: 137 mmol/L (ref 135–145)

## 2017-04-18 LAB — MRSA PCR SCREENING: MRSA by PCR: NEGATIVE

## 2017-04-18 LAB — HEPATITIS B CORE ANTIBODY, IGM: HEP B C IGM: NEGATIVE

## 2017-04-18 MED ORDER — TORSEMIDE 100 MG PO TABS
100.0000 mg | ORAL_TABLET | Freq: Every day | ORAL | Status: DC
  Administered 2017-04-20 – 2017-04-28 (×6): 100 mg via ORAL
  Filled 2017-04-18 (×2): qty 5
  Filled 2017-04-18 (×4): qty 1

## 2017-04-18 NOTE — Progress Notes (Signed)
Pt refused cpap

## 2017-04-18 NOTE — Progress Notes (Signed)
[facility], [place] 04/18/17  Subjective:    Patient seen towards the end of dialysis session Another 6 L of fluid has been removed Patient is being dialyzed in a bariatric chair Breathing has improved She still is requiring oxygen supplementation by nasal cannula   Objective:  Vital signs in last 24 hours:  Temp:  [98 F (36.7 C)-98.5 F (36.9 C)] 98.4 F (36.9 C) (02/22 1200) Pulse Rate:  [80-94] 85 (02/22 1243) Resp:  [0-21] 18 (02/22 1243) BP: (100-150)/(51-96) 130/78 (02/22 1243) SpO2:  [88 %-100 %] 88 % (02/22 1243) FiO2 (%):  [36 %] 36 % (02/22 0048) Weight:  [183 kg (403 lb 7.1 oz)] 183 kg (403 lb 7.1 oz) (02/22 0332)  Weight change: -16.583 kg (-8.9 oz) Filed Weights   04/17/17 0548 04/18/17 0332  Weight: (!) 179.6 kg (396 lb) (!) 183 kg (403 lb 7.1 oz)    Intake/Output:    Intake/Output Summary (Last 24 hours) at 04/18/2017 1527 Last data filed at 04/18/2017 1200 Gross per 24 hour  Intake -  Output 11055 ml  Net -11055 ml     Physical Exam: General: Laying in bariatric recliner chair  HEENT Normocephalic and atraumatic.  Neck supple  Pulm/lungs Clear b/l, normal effort  CVS/Heart RRR no obvious rub  Abdomen:  BS present, distension noted  Extremities: 2+ b/l lower extremity edema  Neurologic: Alert, oriented, able to answer questions  Skin: Venous stasis changes noted b/l lower extremities, + dependent edema over abdomen  Access:  L IJ Tunneled dialysis cathter       Basic Metabolic Panel:  Recent Labs  Lab 04/15/17 0550 04/18/17 0432  NA 138 137  K 4.7 4.7  CL 94* 95*  CO2 32 32  GLUCOSE 229* 199*  BUN 65* 72*  CREATININE 4.17* 4.30*  CALCIUM 8.7* 8.5*  PHOS 5.2*  --      CBC: Recent Labs  Lab 04/12/17 0459 04/16/17 1100 04/18/17 0432  WBC 7.1 7.2 8.2  HGB 8.1* 8.3* 8.5*  HCT 27.1* 27.2* 27.2*  MCV 87.9 87.2 86.3  PLT 347 349 356      Lab Results  Component Value Date   HEPBSAG Negative  04/04/2017   HEPBIGM Negative 04/17/2017      Microbiology:  Recent Results (from the past 240 hour(s))  MRSA PCR Screening     Status: None   Collection Time: 04/18/17 12:37 PM  Result Value Ref Range Status   MRSA by PCR NEGATIVE NEGATIVE Final    Comment:        The GeneXpert MRSA Assay (FDA approved for NASAL specimens only), is one component of a comprehensive MRSA colonization surveillance program. It is not intended to diagnose MRSA infection nor to guide or monitor treatment for MRSA infections. Performed at [facility], [address] [place], [place] [address]     Coagulation Studies: No results for input(s): LABPROT, INR in the last 72 hours.  Urinalysis: No results for input(s): COLORURINE, LABSPEC, PHURINE, GLUCOSEU, HGBUR, BILIRUBINUR, KETONESUR, PROTEINUR, UROBILINOGEN, NITRITE, LEUKOCYTESUR in the last 72 hours.  Invalid input(s): APPERANCEUR    Imaging: No results found.   Medications:   . sodium chloride     . aspirin EC  81 mg Oral Daily  . atorvastatin  80 mg Oral q1800  . cloNIDine  0.2 mg Oral BID  . feeding supplement (NEPRO CARB STEADY)  237 mL Oral TID WC  . heparin  5,000 Units Subcutaneous Q8H  . hydrocerin  Topical BID  . insulin aspart  0-15 Units Subcutaneous TID WC  . insulin aspart  0-5 Units Subcutaneous QHS  . insulin aspart  3 Units Subcutaneous TID WC  . insulin glargine  12 Units Subcutaneous QHS  . metoprolol tartrate  50 mg Oral BID  . multivitamin  1 tablet Oral QHS  . multivitamin with minerals  1 tablet Oral Daily  . polyethylene glycol  17 g Oral Daily  . sodium chloride  2 spray Each Nare QID  . sodium chloride flush  3 mL Intravenous Q12H   sodium chloride, acetaminophen **OR** acetaminophen, ALPRAZolam, cyclobenzaprine, diphenhydrAMINE, ipratropium-albuterol, lactulose, ondansetron **OR** ondansetron (ZOFRAN) IV, oxyCODONE, sodium chloride flush  Assessment/ Plan:  47 y.o. African American  morbidly obese female with h/o diastolic congestive heart failure, chronic kidney disease stage 3-4, HTN, DM, iron deficiency anemia, medication and treatment noncompliance admitted following appointment at the [facility] when she presented with diffuse edema and pain most notable in b/l lower extremities and abdomen.   1. end-stage renal disease - Baseline Cr 1.57 (12/2016)/ GFR 45 - Admit Cr 2.65--> 2.78 --> 3.0 (2/7).  - 24 hr urine Creatinine clearance is 16 cc/min (04/04/2017) -Tunneled dialysis catheter has been placed Start discharge planning for outpatient unit. Patient was able to sit and bariatric dialysis chair today but she will need to be ready for transfer from her wheelchair to the outpatient dialysis chair 5500 cc removed with HD on 2/21 6000 cc removed on 2/21 5000 cc removed in 2/22 Goal 5-6 kg tomorrow   2. Acute on chronic diastolic congestive heart failure (grade 1 diastolic dysfunction) with [place] 2018 Cardiac studies: Normal LVEF with mild to moderate MR and grade 1 diastolic dysfunction. Mildly dilated LV with EF ~55% and normal wall motion.  -Continue to perform perform hemodialysis with ultrafiltration.   Continue to remove volume as tolerated  3. DM-2 with CKD - A1C 13.2 (11/2014) --> 7.3 (02/2017) - Blood glucose management as per hospitalist.  4.  Disposition PPD placed 2/19 Patient was encouraged to work with physical therapy to at least stand in place and pivot so she is able to transfer from wheelchair to outpatient dialysis chair    LOS: [place] 2/22/20193:27 PM  [place], [place]

## 2017-04-18 NOTE — Care Management (Signed)
[name] with [facility] relayed that if patient placed in a skilled facility in [place], [facility] would not be able to accept patient due to her size

## 2017-04-18 NOTE — Clinical Social Work Note (Signed)
CSW received a phone call from [place] admission Mudlogger from [facility] at [place], and [place] in [place], she asked if she could assess patient to determine if they can accept patient.  CSW spoke to patient and she is agreeable to have admissions director meet with her.  Patient asked if there were any other facilities locally and CSW informed her that there are not any that have offered a bed for her.  CSW updated dialysis liasion that a SNF in [place] will be assessing patient today and will let CSW know if they can accept her.  CSW awaiting for decision from SNF.  CSW to continue to follow patient's progress throughout discharge planning.  [place]. [place], MSW, [place]  04/18/2017 4:10 PM

## 2017-04-18 NOTE — Progress Notes (Signed)
Post HD Tx, pt Net Uf 503mL, tolerated well. Pt denies complaints other than getting uncomfortable in chair. Report given to Floor RN. Pt is stable to return to room.

## 2017-04-18 NOTE — Progress Notes (Signed)
MRSA came back negative will discontinue isolation order.

## 2017-04-18 NOTE — Progress Notes (Signed)
Caryl Pina RN, talked to Dr. Margaretmary Eddy about patient's request of note for the doctor for his brother. Waiting for Dr. Rinaldo Ratel note. RN will continue to monitor.

## 2017-04-18 NOTE — Progress Notes (Signed)
Patient ID: [name], female   DOB: 26-Aug-1970, 47 y.o.   MRN: [id number]  [employer] PROGRESS NOTE  [name] WVP:[id number] DOB: 1970/05/11 DOA: 03/24/2017 PCP: [name], NP  HPI/Subjective: Seen at HD, wants to see ophthalmologist as her vision is slowly declining, she would like to be placed at facility.  Denies any floaters  Objective: Vitals:   04/18/17 1200 04/18/17 1243  BP: 108/60 130/78  Pulse: 84 85  Resp: (!) 21 18  Temp: 98.4 F (36.9 C)   SpO2: 94% (!) 88%    Filed Weights   04/17/17 0548 04/18/17 0332  Weight: (!) 179.6 kg (396 lb) (!) 183 kg (403 lb 7.1 oz)    ROS: Review of Systems  Constitutional: Negative for chills and fever.  Eyes: Negative for blurred vision.  Respiratory: Positive for shortness of breath. Negative for cough.   Cardiovascular: Negative for chest pain.  Gastrointestinal: Negative for abdominal pain, constipation, diarrhea, nausea and vomiting.  Genitourinary: Negative for dysuria.  Musculoskeletal: Negative for joint pain.  Neurological: Negative for dizziness and headaches.   Exam: Physical Exam  Constitutional: She is oriented to person, place, and time.  HENT:  Nose: No mucosal edema.  Mouth/Throat: No oropharyngeal exudate or posterior oropharyngeal edema.  Eyes: Conjunctivae, EOM and lids are normal. Pupils are equal, round, and reactive to light.  Neck: No JVD present. Carotid bruit is not present. No edema present. No thyroid mass and no thyromegaly present.  Cardiovascular: S1 normal and S2 normal. Exam reveals no gallop.  No murmur heard. Pulses:      Dorsalis pedis pulses are 2+ on the right side, and 2+ on the left side.  Respiratory: No respiratory distress. She has decreased breath sounds in the right lower field and the left lower field. She has no wheezes. She has no rhonchi. She has no rales.  GI: Soft. Bowel sounds are normal. She exhibits distension. There is no tenderness.   Musculoskeletal:       Right ankle: She exhibits swelling.       Left ankle: She exhibits swelling.  Lymphadenopathy:    She has no cervical adenopathy.  Neurological: She is alert and oriented to person, place, and time. No cranial nerve deficit.  Skin: Skin is warm. Nails show no clubbing.  Slight bleeding at the tunneled catheter site dressing.  Chronic lower extremity discoloration  Psychiatric: She has a normal mood and affect.      Data Reviewed: Basic Metabolic Panel: Recent Labs  Lab 04/15/17 0550 04/18/17 0432  NA 138 137  K 4.7 4.7  CL 94* 95*  CO2 32 32  GLUCOSE 229* 199*  BUN 65* 72*  CREATININE 4.17* 4.30*  CALCIUM 8.7* 8.5*  PHOS 5.2*  --    CBC: Recent Labs  Lab 04/12/17 0459 04/16/17 1100 04/18/17 0432  WBC 7.1 7.2 8.2  HGB 8.1* 8.3* 8.5*  HCT 27.1* 27.2* 27.2*  MCV 87.9 87.2 86.3  PLT 347 349 356   BNP (last 3 results) Recent Labs    01/07/17 1039 03/03/17 1114 03/27/17 0702  BNP 456.0* 617.0* 577.0*     CBG: Recent Labs  Lab 04/17/17 0028 04/17/17 0810 04/17/17 1135 04/17/17 2105 04/18/17 1239  GLUCAP 144* 112* 125* 174* 144*     Scheduled Meds: . aspirin EC  81 mg Oral Daily  . atorvastatin  80 mg Oral q1800  . cloNIDine  0.2 mg Oral BID  . feeding supplement (NEPRO CARB STEADY)  237  mL Oral TID WC  . heparin  5,000 Units Subcutaneous Q8H  . hydrocerin   Topical BID  . insulin aspart  0-15 Units Subcutaneous TID WC  . insulin aspart  0-5 Units Subcutaneous QHS  . insulin aspart  3 Units Subcutaneous TID WC  . insulin glargine  12 Units Subcutaneous QHS  . metoprolol tartrate  50 mg Oral BID  . multivitamin  1 tablet Oral QHS  . multivitamin with minerals  1 tablet Oral Daily  . polyethylene glycol  17 g Oral Daily  . sodium chloride  2 spray Each Nare QID  . sodium chloride flush  3 mL Intravenous Q12H   Continuous Infusions: . sodium chloride      Assessment/Plan:  1. Acute on chronic hypoxic hypercarbic  respiratory failure secondary to acute CHF exacerbation.  Patient currently on 4 L of oxygen.   2. Acute on chronic diastolic congestive heart failure.  Dialysis to help out with fluid management.  Continue metoprolol. 3. Acute kidney injury on end-stage renal disease: Nephrology has now declared her end-stage.  Dialysis currently needed for fluid management.  Patient needs an dialysis outpatient slot.   Tunneled dialysis catheter has been placed Patient was able to sit in a  bariatric dialysis chair today but she will need to be ready for transfer from her wheelchair to the outpatient dialysis chair.  As per my discussion with nephrology plan is to continue dialysis through the weekend and reassess her fluid status 4. Morbid obesity.  Likely has sleep apnea underlying also.  Recommend outpatient sleep study. 5. Constipation on MiraLAX 6. Essential hypertension on metoprolol 7. Hyperlipidemia unspecified on atorvastatin 8.  anxiety.  Patient states that she does not want the Cymbalta.  Start Xanax as needed 9. Type 2 diabetes mellitus on Lantus and sliding scale 10. Weakness.  Patient must walk with physical therapy, she is considering placement now (what she told me) 11.  Gradual decline in vision probably from diabetic retinopathy and patient failed outpatient ophthalmology follow-up will put a consult to ophthalmology   Code Status:     Code Status Orders  (From admission, onward)        Start     Ordered   03/24/17 1951  Full code  Continuous     03/24/17 1952    Code Status History    Date Active Date Inactive Code Status Order ID Comments User Context   03/03/2017 14:20 03/10/2017 21:31 Full Code [id number]  Fritzi Mandes, MD ED   01/07/2017 17:25 01/10/2017 19:00 Full Code [id number]  Demetrios Loll, MD Inpatient   11/08/2016 04:50 11/16/2016 17:17 Full Code [id number]  Harvie Bridge, DO Inpatient   06/25/2016 14:47 06/29/2016 18:15 Full Code [id number]  Hillary Bow, MD ED   08/09/2015  17:57 08/13/2015 17:21 Full Code [id number]  Bettey Costa, MD Inpatient   12/31/2014 20:25 01/03/2015 17:49 Full Code [id number]  Lytle Butte, MD ED   12/15/2014 11:54 12/19/2014 16:57 Full Code [id number]  Hillary Bow, MD ED      Disposition Plan:  if patient is able to sit up with dialysis and tolerated well then hopefully she can go out to rehab once an outpatient dialysis slot is obtained.  Consultants:  Nephrology  Vascular surgery  Time spent: 26 minutes.  Nicholes Mango  [facility]

## 2017-04-18 NOTE — Progress Notes (Signed)
HD Tx ended  

## 2017-04-18 NOTE — Progress Notes (Signed)
Patient up to chair via hoyer lift and transported to hemodialysis.

## 2017-04-18 NOTE — Progress Notes (Signed)
Physical Therapy Treatment Patient Details Name: [name] MRN: [id number] DOB: 05-31-1970 Today's Date: 04/18/2017    History of Present Illness Pt is a42 y.o.femalewith a known history of systolic CHF, hypertension, diabetes, morbid obesity who was recently discharged from the hospital returns from CHF clinic due to weight gain, worsening shortness of breath. Attempts were made to start IV Lasix through home health but were unsuccessful. Also due to her severe shortness of breath was sent to the emergency room. Here chest x-ray shows pulmonary edema and right effusion which is chronic. Patient has been compliant with medications, fluid restriction and salt restriction.  Assessment includes: Acute on chronic diastolic congestive heart failure, acute on chronic respiratory failure with tachypnea due to CHF, HTN, DM, elevated troponin likely due to CHF, and CKD stage IV.    PT Comments    Pt presents with deficits in strength, transfers, mobility, gait, balance, and activity tolerance.  Pt lethargic and fatigued but willing to participate with PT services.  Pt very slow in performing any movement with very frequent pauses and therapeutic rest breaks required during session.  Pt required +2 Max A with bed mobility tasks with extensive cueing for sequencing.  Pt able to tolerate prolonged sitting at EOB this session with some L/R weight shifting but continue to list to the R likely secondary to body habitus.  Pt required encouragement and extensive verbal cues for sequencing with sit to/from stand transfer along with +2 mod A but was able to remain in standing at EOB x 10-15 sec then declined further attempts.  Pt's SpO2 on 4LO2/min dropped from baseline of 94% to 92% after standing with HR remaining in the mid 80s to low 90s during session.  Pt will benefit from PT services in a SNF setting upon discharge to safely address above deficits for decreased caregiver assistance and eventual return to  [facility].     Follow Up Recommendations  SNF     Equipment Recommendations       Recommendations for Other Services       Precautions / Restrictions Precautions Precautions: Fall Restrictions Weight Bearing Restrictions: No    Mobility  Bed Mobility Overal bed mobility: Needs Assistance Bed Mobility: Rolling;Supine to Sit;Sit to Supine Rolling: +2 for physical assistance;Max assist   Supine to sit: Max assist;+2 for physical assistance Sit to supine: +2 for physical assistance;Max assist   General bed mobility comments: Mod verbal cues for sequencing  Transfers Overall transfer level: Needs assistance Equipment used: Rolling walker (2 wheeled) Transfers: Sit to/from Stand Sit to Stand: +2 safety/equipment;Mod assist;+2 physical assistance         General transfer comment: Mod verbal cues for sequencing with pt able to come to standing for around 10-15 sec with SpO2 92% and HR 90 bpm after returning to sitting  Ambulation/Gait             General Gait Details: Unable   Stairs            Wheelchair Mobility    Modified Rankin (Stroke Patients Only)       Balance Overall balance assessment: Needs assistance Sitting-balance support: Feet unsupported;Bilateral upper extremity supported Sitting balance-Leahy Scale: Fair Sitting balance - Comments: Pt shifted to R in sitting secondary to body habitus   Standing balance support: Bilateral upper extremity supported Standing balance-Leahy Scale: Fair  Cognition Arousal/Alertness: Lethargic Behavior During Therapy: Flat affect Overall Cognitive Status: Within Functional Limits for tasks assessed                                        Exercises Total Joint Exercises Ankle Circles/Pumps: AROM;Both;10 reps Long Arc Quad: AROM;Both;5 reps Knee Flexion: AROM;Both;5 reps Other Exercises Other Exercises: sitting tolerance with weight shifting L/R  with fair stability  Other Exercises: Pt education on physiological benefits of activity Other Exercises: Bed mobility training with mod verbal cues for sequencing    General Comments        Pertinent Vitals/Pain Pain Assessment: No/denies pain    Home Living                      Prior Function            PT Goals (current goals can now be found in the care plan section) Progress towards PT goals: Not progressing toward goals - comment(Pt limited by fatigue)    Frequency    Min 2X/week      PT Plan Current plan remains appropriate    Co-evaluation              AM-PAC PT "6 Clicks" Daily Activity  Outcome Measure                   End of Session Equipment Utilized During Treatment: Gait belt;Oxygen Activity Tolerance: Patient limited by fatigue Patient left: in bed;with call bell/phone within reach;with bed alarm set;with family/visitor present Nurse Communication: Mobility status PT Visit Diagnosis: Difficulty in walking, not elsewhere classified (R26.2);Muscle weakness (generalized) (M62.81)     Time: 1505-6979 PT Time Calculation (min) (ACUTE ONLY): 26 min  Charges:  $Therapeutic Exercise: 8-22 mins $Therapeutic Activity: 8-22 mins                    G Codes:       DRoyetta Asal PT, DPT 04/18/17, 3:57 PM

## 2017-04-18 NOTE — Consult Note (Signed)
Psychiatry: Follow-up consult note.  Staff had asked me to come by and reevaluate the patient telling me there was concern that she had refused dialysis.  Patient appears to have been back to agreeing to dialysis again and getting fluid off.  I came to see her this afternoon and she was asleep and not easily arousable.  Husband says he feels like she is calm and that things are getting better.  I did not attempt to wake her up.  If consultation is needed this weekend call the doctor on call.

## 2017-04-19 LAB — IRON AND TIBC
Iron: 16 ug/dL — ABNORMAL LOW (ref 28–170)
SATURATION RATIOS: 6 % — AB (ref 10.4–31.8)
TIBC: 274 ug/dL (ref 250–450)
UIBC: 258 ug/dL

## 2017-04-19 LAB — FERRITIN: Ferritin: 48 ng/mL (ref 11–307)

## 2017-04-19 LAB — GLUCOSE, CAPILLARY
GLUCOSE-CAPILLARY: 153 mg/dL — AB (ref 65–99)
GLUCOSE-CAPILLARY: 121 mg/dL — AB (ref 65–99)
Glucose-Capillary: 165 mg/dL — ABNORMAL HIGH (ref 65–99)
Glucose-Capillary: 169 mg/dL — ABNORMAL HIGH (ref 65–99)

## 2017-04-19 MED ORDER — DIPHENHYDRAMINE HCL 50 MG/ML IJ SOLN
25.0000 mg | Freq: Once | INTRAMUSCULAR | Status: AC
  Administered 2017-04-19: 25 mg via INTRAVENOUS

## 2017-04-19 MED ORDER — EPOETIN ALFA 10000 UNIT/ML IJ SOLN
4000.0000 [IU] | INTRAMUSCULAR | Status: DC
  Administered 2017-04-19 – 2017-04-26 (×4): 4000 [IU] via INTRAVENOUS
  Filled 2017-04-19: qty 0.4

## 2017-04-19 NOTE — Progress Notes (Signed)
Patient ID: [name], female   DOB: 08/25/1970, 47 y.o.   MRN: [id number]  [facility] PROGRESS NOTE  [name] ERD:[id number] DOB: 06-05-1970 DOA: 03/24/2017 PCP: [name], NP  HPI/Subjective: Patient is resting comfortably she is reporting that she cannot work with physical therapy after dialysis and she would rather work with them prior to hemodialysis.  Husband at bedside.    Objective: Vitals:   04/19/17 0333 04/19/17 0735  BP: (!) 92/54 118/61  Pulse: 91 91  Resp: 20 20  Temp: 98.5 F (36.9 C) 98.2 F (36.8 C)  SpO2: 96% 96%    Filed Weights   04/18/17 0332 04/19/17 0333  Weight: (!) 183 kg (403 lb 7.1 oz) 32 kg (70 lb 8.8 oz)    ROS: Review of Systems  Constitutional: Negative for chills and fever.  Eyes: Negative for blurred vision.  Respiratory: Positive for shortness of breath. Negative for cough.   Cardiovascular: Negative for chest pain.  Gastrointestinal: Negative for abdominal pain, constipation, diarrhea, nausea and vomiting.  Genitourinary: Negative for dysuria.  Musculoskeletal: Negative for joint pain.  Neurological: Negative for dizziness and headaches.   Exam: Physical Exam  Constitutional: She is oriented to person, place, and time.  HENT:  Nose: No mucosal edema.  Mouth/Throat: No oropharyngeal exudate or posterior oropharyngeal edema.  Eyes: Conjunctivae, EOM and lids are normal. Pupils are equal, round, and reactive to light.  Neck: No JVD present. Carotid bruit is not present. No edema present. No thyroid mass and no thyromegaly present.  Cardiovascular: S1 normal and S2 normal. Exam reveals no gallop.  No murmur heard. Pulses:      Dorsalis pedis pulses are 2+ on the right side, and 2+ on the left side.  Respiratory: No respiratory distress. She has decreased breath sounds in the right lower field and the left lower field. She has no wheezes. She has no rhonchi. She has no rales.  GI: Soft. Bowel sounds are normal. She  exhibits distension. There is no tenderness.  Musculoskeletal:       Right ankle: She exhibits swelling.       Left ankle: She exhibits swelling.  Lymphadenopathy:    She has no cervical adenopathy.  Neurological: She is alert and oriented to person, place, and time. No cranial nerve deficit.  Skin: Skin is warm. Nails show no clubbing.  Slight bleeding at the tunneled catheter site dressing.  Chronic lower extremity discoloration  Psychiatric: She has a normal mood and affect.      Data Reviewed: Basic Metabolic Panel: Recent Labs  Lab 04/15/17 0550 04/18/17 0432  NA 138 137  K 4.7 4.7  CL 94* 95*  CO2 32 32  GLUCOSE 229* 199*  BUN 65* 72*  CREATININE 4.17* 4.30*  CALCIUM 8.7* 8.5*  PHOS 5.2*  --    CBC: Recent Labs  Lab 04/16/17 1100 04/18/17 0432  WBC 7.2 8.2  HGB 8.3* 8.5*  HCT 27.2* 27.2*  MCV 87.2 86.3  PLT 349 356   BNP (last 3 results) Recent Labs    01/07/17 1039 03/03/17 1114 03/27/17 0702  BNP 456.0* 617.0* 577.0*     CBG: Recent Labs  Lab 04/18/17 1239 04/18/17 1653 04/18/17 2004 04/19/17 0732 04/19/17 1140  GLUCAP 144* 159* 154* 153* 165*     Scheduled Meds: . aspirin EC  81 mg Oral Daily  . atorvastatin  80 mg Oral q1800  . feeding supplement (NEPRO CARB STEADY)  237 mL Oral TID WC  .  heparin  5,000 Units Subcutaneous Q8H  . hydrocerin   Topical BID  . insulin aspart  0-15 Units Subcutaneous TID WC  . insulin aspart  0-5 Units Subcutaneous QHS  . insulin aspart  3 Units Subcutaneous TID WC  . insulin glargine  12 Units Subcutaneous QHS  . multivitamin  1 tablet Oral QHS  . multivitamin with minerals  1 tablet Oral Daily  . polyethylene glycol  17 g Oral Daily  . sodium chloride  2 spray Each Nare QID  . sodium chloride flush  3 mL Intravenous Q12H  . torsemide  100 mg Oral Daily   Continuous Infusions: . sodium chloride      Assessment/Plan:  1. Acute on chronic hypoxic hypercarbic respiratory failure secondary to  acute CHF exacerbation.  Patient currently on 4 L of oxygen, wean off as tolerated 2. Acute on chronic diastolic congestive heart failure.  Dialysis to help out with fluid management.  Continue metoprolol. 3. Acute kidney injury on end-stage renal disease: Nephrology has now declared her end-stage.  Dialysis currently needed for fluid management.  Patient needs an dialysis outpatient slot.   Tunneled dialysis catheter has been placed Patient was able to sit in a  bariatric dialysis chair today but she will need to be ready for transfer from her wheelchair to the outpatient dialysis chair.  As per my discussion with nephrology plan is to continue dialysis through the weekend and reassess her fluid status 4. Morbid obesity.  Likely has sleep apnea underlying also.  Recommend outpatient sleep study. 5. Constipation on MiraLAX 6. Essential hypertension on metoprolol 7. Hyperlipidemia unspecified on atorvastatin 8.  anxiety.  Patient states that she does not want the Cymbalta.  Start Xanax as needed 9. Type 2 diabetes mellitus on Lantus and sliding scale 10. Weakness.  Patient must walk with physical therapy, she is considering placement and prefers working with physical therapy prior to her dialysis 11.  Gradual decline in vision probably from diabetic retinopathy and patient failed outpatient ophthalmology follow-up , consulted ophthalmology and discussed with Dr. Edison Pace he is aware of the consult, recommending outpatient follow-up as  he cannot bring the necessary equipment to the hospital  Plan of care discussed in detail with the patient and her husband at bedside. Code Status:     Code Status Orders  (From admission, onward)        Start     Ordered   03/24/17 1951  Full code  Continuous     03/24/17 1952    Code Status History    Date Active Date Inactive Code Status Order ID Comments User Context   03/03/2017 14:20 03/10/2017 21:31 Full Code [id number]  Fritzi Mandes, MD ED   01/07/2017 17:25  01/10/2017 19:00 Full Code [id number]  Demetrios Loll, MD Inpatient   11/08/2016 04:50 11/16/2016 17:17 Full Code [id number]  Harvie Bridge, DO Inpatient   06/25/2016 14:47 06/29/2016 18:15 Full Code [id number]  Hillary Bow, MD ED   08/09/2015 17:57 08/13/2015 17:21 Full Code [id number]  Bettey Costa, MD Inpatient   12/31/2014 20:25 01/03/2015 17:49 Full Code [id number]  Lytle Butte, MD ED   12/15/2014 11:54 12/19/2014 16:57 Full Code [id number]  Hillary Bow, MD ED      Disposition Plan:  if patient is able to sit up with dialysis and tolerated well then hopefully she can go out to rehab once an outpatient dialysis slot is obtained.  Consultants:  Nephrology  Vascular surgery  Time spent: 26 minutes.  Catherine Oak  [facility]

## 2017-04-19 NOTE — Progress Notes (Signed)
Post treatment assessment

## 2017-04-19 NOTE — Progress Notes (Signed)
Pre HD assessment  

## 2017-04-19 NOTE — Progress Notes (Signed)
[facility], [place] 04/19/17  Subjective:   Patient is doing well today She is able to eat without nausea vomiting She is sitting up on the side of the bed Denies any acute shortness of breath  Objective:  Vital signs in last 24 hours:  Temp:  [98.2 F (36.8 C)-98.5 F (36.9 C)] 98.2 F (36.8 C) (02/23 0735) Pulse Rate:  [88-91] 91 (02/23 0735) Resp:  [18-20] 20 (02/23 0735) BP: (92-118)/(54-70) 118/61 (02/23 0735) SpO2:  [92 %-100 %] 96 % (02/23 0735) Weight:  [32 kg (70 lb 8.8 oz)] 32 kg (70 lb 8.8 oz) (02/23 0333)  Weight change: -151 kg (-14.3 oz) Filed Weights   04/18/17 0332 04/19/17 0333  Weight: (!) 183 kg (403 lb 7.1 oz) 32 kg (70 lb 8.8 oz)    Intake/Output:    Intake/Output Summary (Last 24 hours) at 04/19/2017 1335 Last data filed at 04/19/2017 0943 Gross per 24 hour  Intake 240 ml  Output 0 ml  Net 240 ml     Physical Exam: General:  Sitting up on the side of the bed  HEENT Normocephalic and atraumatic.  Neck supple  Pulm/lungs Clear b/l, normal effort  CVS/Heart RRR no obvious rub  Abdomen:  BS present, distension noted  Extremities: 2+ b/l lower extremity edema  Neurologic: Alert, oriented, able to answer questions  Skin: Venous stasis changes noted b/l lower extremities, + dependent edema over abdomen  Access:  L IJ Tunneled dialysis cathter       Basic Metabolic Panel:  Recent Labs  Lab 04/15/17 0550 04/18/17 0432  NA 138 137  K 4.7 4.7  CL 94* 95*  CO2 32 32  GLUCOSE 229* 199*  BUN 65* 72*  CREATININE 4.17* 4.30*  CALCIUM 8.7* 8.5*  PHOS 5.2*  --      CBC: Recent Labs  Lab 04/16/17 1100 04/18/17 0432  WBC 7.2 8.2  HGB 8.3* 8.5*  HCT 27.2* 27.2*  MCV 87.2 86.3  PLT 349 356      Lab Results  Component Value Date   HEPBSAG Negative 04/04/2017   HEPBIGM Negative 04/17/2017      Microbiology:  Recent Results (from the past 240 hour(s))  MRSA PCR Screening     Status: None   Collection Time: 04/18/17 12:37 PM  Result Value Ref Range Status   MRSA by PCR NEGATIVE NEGATIVE Final    Comment:        The GeneXpert MRSA Assay (FDA approved for NASAL specimens only), is one component of a comprehensive MRSA colonization surveillance program. It is not intended to diagnose MRSA infection nor to guide or monitor treatment for MRSA infections. Performed at [facility], [address] [place], [place] [address]     Coagulation Studies: No results for input(s): LABPROT, INR in the last 72 hours.  Urinalysis: No results for input(s): COLORURINE, LABSPEC, PHURINE, GLUCOSEU, HGBUR, BILIRUBINUR, KETONESUR, PROTEINUR, UROBILINOGEN, NITRITE, LEUKOCYTESUR in the last 72 hours.  Invalid input(s): APPERANCEUR    Imaging: No results found.   Medications:   . sodium chloride     . aspirin EC  81 mg Oral Daily  . atorvastatin  80 mg Oral q1800  . feeding supplement (NEPRO CARB STEADY)  237 mL Oral TID WC  . heparin  5,000 Units Subcutaneous Q8H  . hydrocerin   Topical BID  . insulin aspart  0-15 Units Subcutaneous TID WC  . insulin aspart  0-5 Units Subcutaneous QHS  . insulin aspart  3 Units Subcutaneous TID WC  . insulin glargine  12 Units Subcutaneous QHS  . multivitamin  1 tablet Oral QHS  . multivitamin with minerals  1 tablet Oral Daily  . polyethylene glycol  17 g Oral Daily  . sodium chloride  2 spray Each Nare QID  . sodium chloride flush  3 mL Intravenous Q12H  . torsemide  100 mg Oral Daily   sodium chloride, acetaminophen **OR** acetaminophen, ALPRAZolam, cyclobenzaprine, diphenhydrAMINE, ipratropium-albuterol, lactulose, ondansetron **OR** ondansetron (ZOFRAN) IV, oxyCODONE, sodium chloride flush  Assessment/ Plan:  47 y.o. African American morbidly obese female with h/o diastolic congestive heart failure, chronic kidney disease stage 3-4, HTN, DM, iron deficiency anemia, medication and treatment noncompliance admitted following  appointment at the [facility] when she presented with diffuse edema and pain most notable in b/l lower extremities and abdomen.   1. End-stage renal disease  - 24 hr urine Creatinine clearance is 16 cc/min (04/04/2017) 5500 cc removed with HD on 2/21 6000 cc removed on 2/21 5000 cc removed in 2/22 Goal 5-6 kg again today as tolerated   2. Acute on chronic diastolic congestive heart failure (grade 1 diastolic dysfunction) with [place] 2018 Cardiac studies: Normal LVEF with mild to moderate MR and grade 1 diastolic dysfunction. Mildly dilated LV with EF ~55% and normal wall motion.  -Continue to perform perform hemodialysis with ultrafiltration.   Continue to remove volume as tolerated   3. DM-2 with CKD - A1C 13.2 (11/2014) --> 7.3 (02/2017) - Blood glucose management as per hospitalist.  4.  Anemia of CKD - EPO with HD - check iron studies  5. Disposition PPD placed 2/19 she is able to get her dialysis seated in a bariatric chair Patient was encouraged to work with physical therapy to at least stand in place and pivot so she is able to transfer from wheelchair to outpatient dialysis chair    LOS: [place] 2/23/20191:35 PM  [place], [place]

## 2017-04-19 NOTE — Progress Notes (Signed)
Spoke with physical therapy, requested them to work with patient today prior to HD per DR Gouru.

## 2017-04-19 NOTE — Progress Notes (Signed)
Dialysis treatment ended safely

## 2017-04-19 NOTE — Progress Notes (Signed)
HD tx start 

## 2017-04-20 LAB — GLUCOSE, CAPILLARY
GLUCOSE-CAPILLARY: 192 mg/dL — AB (ref 65–99)
GLUCOSE-CAPILLARY: 213 mg/dL — AB (ref 65–99)
Glucose-Capillary: 179 mg/dL — ABNORMAL HIGH (ref 65–99)
Glucose-Capillary: 178 mg/dL — ABNORMAL HIGH (ref 65–99)

## 2017-04-20 LAB — TRANSFERRIN: TRANSFERRIN: 187 mg/dL — AB (ref 192–382)

## 2017-04-20 MED ORDER — HEPARIN BOLUS VIA INFUSION
2000.0000 [IU] | Freq: Once | INTRAVENOUS | Status: DC
  Filled 2017-04-20: qty 2000

## 2017-04-20 NOTE — Plan of Care (Signed)
Pt is A&Ox4. VSS . 4L O2 [place] continued. Husband at bedside. Pt sitting on side of bed for much of shift. Pt c/o back pain, relieved with prn tylenol per order. Will continue to monitor and report to oncoming RN .  Progressing Health Behavior/Discharge Planning: Ability to manage health-related needs will improve 04/20/2017 1347 - Progressing by Aleen Campi, RN Clinical Measurements: Ability to maintain clinical measurements within normal limits will improve 04/20/2017 1347 - Progressing by Aleen Campi, RN Will remain free from infection 04/20/2017 1347 - Progressing by Aleen Campi, RN Diagnostic test results will improve 04/20/2017 1347 - Progressing by Aleen Campi, RN Respiratory complications will improve 04/20/2017 1347 - Progressing by Aleen Campi, RN Cardiovascular complication will be avoided 04/20/2017 1347 - Progressing by Aleen Campi, RN Activity: Risk for activity intolerance will decrease 04/20/2017 1347 - Progressing by Aleen Campi, RN Nutrition: Adequate nutrition will be maintained 04/20/2017 1347 - Progressing by Aleen Campi, RN Coping: Level of anxiety will decrease 04/20/2017 1347 - Progressing by Aleen Campi, RN Elimination: Will not experience complications related to bowel motility 04/20/2017 1347 - Progressing by Aleen Campi, RN Will not experience complications related to urinary retention 04/20/2017 1347 - Progressing by Aleen Campi, RN Pain Managment: General experience of comfort will improve 04/20/2017 1347 - Progressing by Aleen Campi, RN Safety: Ability to remain free from injury will improve 04/20/2017 1347 - Progressing by Aleen Campi, RN Skin Integrity: Risk for impaired skin integrity will decrease 04/20/2017 1347 - Progressing by Aleen Campi, RN Education: Ability to demonstrate management of disease process will improve 04/20/2017 1347 - Progressing by Aleen Campi, RN Ability to verbalize understanding of medication therapies  will improve 04/20/2017 1347 - Progressing by Aleen Campi, RN Activity: Capacity to carry out activities will improve 04/20/2017 1347 - Progressing by Aleen Campi, RN Cardiac: Ability to achieve and maintain adequate cardiopulmonary perfusion will improve 04/20/2017 1347 - Progressing by Aleen Campi, RN Spiritual Needs Ability to function at adequate level 04/20/2017 1347 - Progressing by Aleen Campi, RN

## 2017-04-20 NOTE — Progress Notes (Signed)
[facility], [place] 04/20/17  Subjective:   Patient is doing well today She is able to eat without nausea vomiting She is sitting up on the side of the bed Denies any acute shortness of breath 6000 cc removed with HD in saturday  Objective:  Vital signs in last 24 hours:  Temp:  [98.1 F (36.7 C)-99.4 F (37.4 C)] 98.4 F (36.9 C) (02/24 0946) Pulse Rate:  [96-117] 100 (02/24 0946) Resp:  [17-21] 18 (02/24 0946) BP: (97-170)/(65-122) 140/80 (02/24 0946) SpO2:  [85 %-97 %] 96 % (02/24 0412) Weight:  [115.6 kg (254 lb 12.8 oz)-182 kg (401 lb 3.8 oz)] 182 kg (401 lb 3.8 oz) (02/24 0412)  Weight change: 83.6 kg (184 lb 4 oz) Filed Weights   04/19/17 0333 04/19/17 1830 04/20/17 0412  Weight: 32 kg (70 lb 8.8 oz) 115.6 kg (254 lb 12.8 oz) (!) 182 kg (401 lb 3.8 oz)    Intake/Output:    Intake/Output Summary (Last 24 hours) at 04/20/2017 1530 Last data filed at 04/20/2017 1409 Gross per 24 hour  Intake 480 ml  Output 6156 ml  Net -5676 ml     Physical Exam: General:  Sitting up on the side of the bed  HEENT Normocephalic and atraumatic.  Neck supple  Pulm/lungs Clear b/l, normal effort  CVS/Heart RRR no obvious rub  Abdomen:  BS present, distension noted  Extremities: 2+ b/l lower extremity edema  Neurologic: Alert, oriented, able to answer questions  Skin: Venous stasis changes noted b/l lower extremities, + dependent edema over abdomen  Access:  L IJ Tunneled dialysis cathter       Basic Metabolic Panel:  Recent Labs  Lab 04/15/17 0550 04/18/17 0432  NA 138 137  K 4.7 4.7  CL 94* 95*  CO2 32 32  GLUCOSE 229* 199*  BUN 65* 72*  CREATININE 4.17* 4.30*  CALCIUM 8.7* 8.5*  PHOS 5.2*  --      CBC: Recent Labs  Lab 04/16/17 1100 04/18/17 0432  WBC 7.2 8.2  HGB 8.3* 8.5*  HCT 27.2* 27.2*  MCV 87.2 86.3  PLT 349 356      Lab Results  Component Value Date   HEPBSAG Negative 04/04/2017   HEPBIGM Negative 04/17/2017       Microbiology:  Recent Results (from the past 240 hour(s))  MRSA PCR Screening     Status: None   Collection Time: 04/18/17 12:37 PM  Result Value Ref Range Status   MRSA by PCR NEGATIVE NEGATIVE Final    Comment:        The GeneXpert MRSA Assay (FDA approved for NASAL specimens only), is one component of a comprehensive MRSA colonization surveillance program. It is not intended to diagnose MRSA infection nor to guide or monitor treatment for MRSA infections. Performed at [facility], [address] [place], [place] [address]     Coagulation Studies: No results for input(s): LABPROT, INR in the last 72 hours.  Urinalysis: No results for input(s): COLORURINE, LABSPEC, PHURINE, GLUCOSEU, HGBUR, BILIRUBINUR, KETONESUR, PROTEINUR, UROBILINOGEN, NITRITE, LEUKOCYTESUR in the last 72 hours.  Invalid input(s): APPERANCEUR    Imaging: No results found.   Medications:   . sodium chloride     . aspirin EC  81 mg Oral Daily  . atorvastatin  80 mg Oral q1800  . [START ON 04/22/2017] epoetin (EPOGEN/PROCRIT) injection  4,000 Units Intravenous Q T,Th,Sa-HD  . feeding supplement (NEPRO CARB STEADY)  237 mL Oral TID WC  .  heparin  5,000 Units Subcutaneous Q8H  . hydrocerin   Topical BID  . insulin aspart  0-15 Units Subcutaneous TID WC  . insulin aspart  0-5 Units Subcutaneous QHS  . insulin aspart  3 Units Subcutaneous TID WC  . insulin glargine  12 Units Subcutaneous QHS  . multivitamin  1 tablet Oral QHS  . multivitamin with minerals  1 tablet Oral Daily  . polyethylene glycol  17 g Oral Daily  . sodium chloride  2 spray Each Nare QID  . sodium chloride flush  3 mL Intravenous Q12H  . torsemide  100 mg Oral Daily   sodium chloride, acetaminophen **OR** acetaminophen, ALPRAZolam, cyclobenzaprine, diphenhydrAMINE, ipratropium-albuterol, lactulose, ondansetron **OR** ondansetron (ZOFRAN) IV, oxyCODONE, sodium chloride flush  Assessment/ Plan:  47 y.o.  African American morbidly obese female with h/o diastolic congestive heart failure, chronic kidney disease stage 3-4, HTN, DM, iron deficiency anemia, medication and treatment noncompliance admitted following appointment at the [facility] when she presented with diffuse edema and pain most notable in b/l lower extremities and abdomen.   1. End-stage renal disease  - 24 hr urine Creatinine clearance is 16 cc/min (04/04/2017) 5500 cc removed with HD on 2/21 6000 cc removed on 2/21 5000 cc removed on 2/22 6000 cc removed on 2/23 She probably has another 10-12 L of fluid  Schedule HD for afternoon shift preferably to give patient time to work with PT in AM prior to HD  2. Acute on chronic diastolic congestive heart failure (grade 1 diastolic dysfunction) with [place] 2018 Cardiac studies: Normal LVEF with mild to moderate MR and grade 1 diastolic dysfunction. Mildly dilated LV with EF ~55% and normal wall motion.  -Continue to perform perform hemodialysis with ultrafiltration.   Continue to remove volume as tolerated   3. DM-2 with CKD - A1C 13.2 (11/2014) --> 7.3 (02/2017) - Blood glucose management as per hospitalist.  4.  Anemia of CKD - EPO with HD - iron deficiency. Patient has had Feraheme in 2018. Steroids, benadryl and ranitidine was given prior to treatment. Will need to discuss with patient re: Venofer. May need benadryl prior to administration.   5. Disposition PPD placed 2/19 she is able to get her dialysis seated in a bariatric chair Patient was encouraged to work with physical therapy to at least stand in place and pivot so she is able to transfer from wheelchair to outpatient dialysis chair    LOS: [place] 2/24/20193:30 PM  [facility], [place]

## 2017-04-20 NOTE — Progress Notes (Signed)
Patient ID: [name], female   DOB: 1970/05/14, 47 y.o.   MRN: [id number]  [employer] PROGRESS NOTE  [name]:[id number] DOB: [birth year] DOA: 03/24/2017 PCP: [name], NP  HPI/Subjective: Patient is feeling weak but eating better she is reporting that she cannot work with physical therapy after dialysis and she would rather work with them prior to hemodialysis.  Husband at bedside.    Objective: Vitals:   04/20/17 0412 04/20/17 0946  BP: (!) 142/84 140/80  Pulse: (!) 103 100  Resp: 17 18  Temp: 98.4 F (36.9 C) 98.4 F (36.9 C)  SpO2: 96%     Filed Weights   04/19/17 0333 04/19/17 1830 04/20/17 0412  Weight: 32 kg (70 lb 8.8 oz) 115.6 kg (254 lb 12.8 oz) (!) 182 kg (401 lb 3.8 oz)    ROS: Review of Systems  Constitutional: Negative for chills and fever.  Eyes: Negative for blurred vision.  Respiratory: Positive for shortness of breath. Negative for cough.   Cardiovascular: Negative for chest pain.  Gastrointestinal: Negative for abdominal pain, constipation, diarrhea, nausea and vomiting.  Genitourinary: Negative for dysuria.  Musculoskeletal: Negative for joint pain.  Neurological: Negative for dizziness and headaches.   Exam: Physical Exam  Constitutional: She is oriented to person, place, and time.  HENT:  Nose: No mucosal edema.  Mouth/Throat: No oropharyngeal exudate or posterior oropharyngeal edema.  Eyes: Conjunctivae, EOM and lids are normal. Pupils are equal, round, and reactive to light.  Neck: No JVD present. Carotid bruit is not present. No edema present. No thyroid mass and no thyromegaly present.  Cardiovascular: S1 normal and S2 normal. Exam reveals no gallop.  No murmur heard. Pulses:      Dorsalis pedis pulses are 2+ on the right side, and 2+ on the left side.  Respiratory: No respiratory distress. She has decreased breath sounds in the right lower field and the left lower field. She has no wheezes. She has no rhonchi. She  has no rales.  GI: Soft. Bowel sounds are normal. She exhibits distension. There is no tenderness.  Musculoskeletal:       Right ankle: She exhibits swelling.       Left ankle: She exhibits swelling.  Lymphadenopathy:    She has no cervical adenopathy.  Neurological: She is alert and oriented to person, place, and time. No cranial nerve deficit.  Skin: Skin is warm. Nails show no clubbing.  Slight bleeding at the tunneled catheter site dressing.  Chronic lower extremity discoloration  Psychiatric: She has a normal mood and affect.      Data Reviewed: Basic Metabolic Panel: Recent Labs  Lab 04/15/17 0550 04/18/17 0432  NA 138 137  K 4.7 4.7  CL 94* 95*  CO2 32 32  GLUCOSE 229* 199*  BUN 65* 72*  CREATININE 4.17* 4.30*  CALCIUM 8.7* 8.5*  PHOS 5.2*  --    CBC: Recent Labs  Lab 04/16/17 1100 04/18/17 0432  WBC 7.2 8.2  HGB 8.3* 8.5*  HCT 27.2* 27.2*  MCV 87.2 86.3  PLT 349 356   BNP (last 3 results) Recent Labs    01/07/17 1039 03/03/17 1114 03/27/17 0702  BNP 456.0* 617.0* 577.0*     CBG: Recent Labs  Lab 04/19/17 1140 04/19/17 1842 04/19/17 2009 04/20/17 0808 04/20/17 1123  GLUCAP 165* 121* 169* 179* 178*     Scheduled Meds: . aspirin EC  81 mg Oral Daily  . atorvastatin  80 mg Oral q1800  . [  START ON 04/22/2017] epoetin (EPOGEN/PROCRIT) injection  4,000 Units Intravenous Q T,Th,Sa-HD  . feeding supplement (NEPRO CARB STEADY)  237 mL Oral TID WC  . heparin  5,000 Units Subcutaneous Q8H  . hydrocerin   Topical BID  . insulin aspart  0-15 Units Subcutaneous TID WC  . insulin aspart  0-5 Units Subcutaneous QHS  . insulin aspart  3 Units Subcutaneous TID WC  . insulin glargine  12 Units Subcutaneous QHS  . multivitamin  1 tablet Oral QHS  . multivitamin with minerals  1 tablet Oral Daily  . polyethylene glycol  17 g Oral Daily  . sodium chloride  2 spray Each Nare QID  . sodium chloride flush  3 mL Intravenous Q12H  . torsemide  100 mg Oral  Daily   Continuous Infusions: . sodium chloride      Assessment/Plan:  1. Acute on chronic hypoxic hypercarbic respiratory failure secondary to acute CHF exacerbation.  Patient currently on 4 L of oxygen, wean off as tolerated 2. Acute on chronic diastolic congestive heart failure.  Dialysis to help out with fluid management.  Continue metoprolol. 3. Acute kidney injury on end-stage renal disease: Nephrology has now declared her end-stage.  Dialysis currently needed for fluid management.  Patient needs an dialysis outpatient slot.   Tunneled dialysis catheter has been placed Patient was able to sit in a  bariatric dialysis chair  but she will need to be ready for transfer from her wheelchair to the outpatient dialysis chair.  As per my discussion with nephrology plan is to continue dialysis and reassess her fluid status.  Discussed with Dr. Candiss Norse 4. Morbid obesity.  Likely has sleep apnea underlying also.  Recommend outpatient sleep study. 5. Constipation on MiraLAX 6. Essential hypertension on metoprolol 7. Hyperlipidemia unspecified on atorvastatin 8.  anxiety.  Patient states that she does not want the Cymbalta.   Xanax as needed 9. Type 2 diabetes mellitus on Lantus and sliding scale 10. Weakness.  Patient must walk with physical therapy, she is considering placement and prefers working with physical therapy prior to her dialysis 11.  Gradual decline in vision probably from diabetic retinopathy and patient failed outpatient ophthalmology follow-up , consulted ophthalmology and discussed with Dr. Edison Pace he is aware of the consult, recommending outpatient follow-up as  he cannot bring the necessary equipment to the hospital  Plan of care discussed in detail with the patient and her husband at bedside. Code Status:     Code Status Orders  (From admission, onward)        Start     Ordered   03/24/17 1951  Full code  Continuous     03/24/17 1952    Code Status History    Date Active  Date Inactive Code Status Order ID Comments User Context   03/03/2017 14:20 03/10/2017 21:31 Full Code [id number]  Fritzi Mandes, MD ED   01/07/2017 17:25 01/10/2017 19:00 Full Code [id number]  Demetrios Loll, MD Inpatient   11/08/2016 04:50 11/16/2016 17:17 Full Code [id number]  Harvie Bridge, DO Inpatient   06/25/2016 14:47 06/29/2016 18:15 Full Code [id number]  Hillary Bow, MD ED   08/09/2015 17:57 08/13/2015 17:21 Full Code [id number]  Bettey Costa, MD Inpatient   12/31/2014 20:25 01/03/2015 17:49 Full Code [id number]  Lytle Butte, MD ED   12/15/2014 11:54 12/19/2014 16:57 Full Code [id number]  Hillary Bow, MD ED      Disposition Plan:  if patient is able to sit up with dialysis and tolerated well then  hopefully she can go out to rehab once an outpatient dialysis slot is obtained.  Consultants:  Nephrology  Vascular surgery  Time spent: 26 minutes.  Nicholes Mango  [employer]

## 2017-04-21 LAB — CBC
HEMATOCRIT: 26 % — AB (ref 35.0–47.0)
HEMOGLOBIN: 8 g/dL — AB (ref 12.0–16.0)
MCH: 26.5 pg (ref 26.0–34.0)
MCHC: 30.9 g/dL — ABNORMAL LOW (ref 32.0–36.0)
MCV: 85.9 fL (ref 80.0–100.0)
Platelets: 321 10*3/uL (ref 150–440)
RBC: 3.03 MIL/uL — AB (ref 3.80–5.20)
RDW: 17 % — ABNORMAL HIGH (ref 11.5–14.5)
WBC: 8.3 10*3/uL (ref 3.6–11.0)

## 2017-04-21 LAB — BASIC METABOLIC PANEL
Anion gap: 10 (ref 5–15)
BUN: 56 mg/dL — ABNORMAL HIGH (ref 6–20)
CHLORIDE: 90 mmol/L — AB (ref 101–111)
CO2: 30 mmol/L (ref 22–32)
Calcium: 8.5 mg/dL — ABNORMAL LOW (ref 8.9–10.3)
Creatinine, Ser: 3.93 mg/dL — ABNORMAL HIGH (ref 0.44–1.00)
GFR calc non Af Amer: 13 mL/min — ABNORMAL LOW (ref >60)
GFR, EST AFRICAN AMERICAN: 15 mL/min — AB (ref >60)
GLUCOSE: 188 mg/dL — AB (ref 65–99)
POTASSIUM: 4.4 mmol/L (ref 3.5–5.1)
Sodium: 130 mmol/L — ABNORMAL LOW (ref 135–145)

## 2017-04-21 LAB — GLUCOSE, CAPILLARY
GLUCOSE-CAPILLARY: 194 mg/dL — AB (ref 65–99)
Glucose-Capillary: 204 mg/dL — ABNORMAL HIGH (ref 65–99)
Glucose-Capillary: 132 mg/dL — ABNORMAL HIGH (ref 65–99)
Glucose-Capillary: 147 mg/dL — ABNORMAL HIGH (ref 65–99)

## 2017-04-21 MED ORDER — IRON SUCROSE 20 MG/ML IV SOLN
100.0000 mg | INTRAVENOUS | Status: DC
  Administered 2017-04-22 – 2017-04-24 (×2): 100 mg via INTRAVENOUS
  Filled 2017-04-21 (×5): qty 5

## 2017-04-21 NOTE — Progress Notes (Signed)
HD TX Started

## 2017-04-21 NOTE — Progress Notes (Signed)
Nutrition Follow up Note   DOCUMENTATION CODES:   Morbid obesity  INTERVENTION:   Bowel regimen as needed per MD  Nepro Shake po TID, each supplement provides 425 kcal and 19 grams protein  MVI daily  Rena-vite daily   NUTRITION DIAGNOSIS:   Increased nutrient needs related to chronic illness(CHF, morbid obesity) as evidenced by increased estimated needs from protein.  -new HD  GOAL:   Patient will meet greater than or equal to 90% of their needs  -progressing  MONITOR:   PO intake, Supplement acceptance, Weight trends, TF tolerance, Skin, I & O's, Labs  ASSESSMENT:   47 y.o. female with a known history of systolic CHF, CKD IV, hypertension, diabetes, morbid obesity who was recently discharged from the hospital returns from CHF clinic due to weight gain, worsening shortness of breath.   Pt s/p LIJ cath placement and HD initiation 2/8  Pt continues to do well; eating 100% of meals and drinking some Nepro. Pt's weight coming back down. On rena-vite.   Medications reviewed and include: aspirin, epogen, heparin, insulin, MVI, rena-vite, miralax, toresemide, oxycodone   Labs reviewed: Na 130(L), K 4.4 wnl, Cl 90(L), BUN 56(H), creat 3.93(H), Ca 8.5(L)  Hgb 8.0(L), Hct 26.0(L)  Diet renal/carb modified with fluid restriction Diet-HS Snack? Nothing; Fluid restriction: 1200 mL Fluid; Room service appropriate? Yes; Fluid consistency: Thin  EDUCATION NEEDS:   Education needs have been addressed  Skin:  Reviewed RN Assessment  Last BM:  2/25- type 3  Height:   Ht Readings from Last 1 Encounters:  04/16/17 5\' 4"  (1.626 m)    Weight:   Wt Readings from Last 1 Encounters:  04/21/17 (!) 361 lb 8.9 oz (164 kg)    Ideal Body Weight:  54.5 kg  BMI:  Body mass index is 62.06 kg/m.  Estimated Nutritional Needs:   Kcal:  2400-2700kcal/day   Protein:  >150g/day   Fluid:  per MD  Koleen Distance MS, RD, LDN Pager #[phone] After Hours Pager: [phone]

## 2017-04-21 NOTE — Progress Notes (Signed)
HD TX ended  

## 2017-04-21 NOTE — Progress Notes (Signed)
Patient ID: [name], female   DOB: 1970-03-14, 47 y.o.   MRN: [id number]  [employer] PROGRESS NOTE  [name] [id number]:[id number] DOB: [birth year] DOA: 03/24/2017 PCP: [name] Berry, NP  HPI/Subjective: Patient is feeling weak , waiting to work with physical therapy before she goes down for hemodialysis today.  Husband at bedside     Objective: Vitals:   04/21/17 0343 04/21/17 0735  BP: (!) 141/75 (!) 142/90  Pulse: 100 100  Resp: 18 18  Temp: 98 F (36.7 C) 98.4 F (36.9 C)  SpO2: 93% 90%    Filed Weights   04/19/17 1830 04/20/17 0412 04/21/17 0343  Weight: 115.6 kg (254 lb 12.8 oz) (!) 182 kg (401 lb 3.8 oz) (!) 164 kg (361 lb 8.9 oz)    ROS: Review of Systems  Constitutional: Negative for chills and fever.  Eyes: Negative for blurred vision.  Respiratory: Positive for shortness of breath. Negative for cough.   Cardiovascular: Negative for chest pain.  Gastrointestinal: Negative for abdominal pain, constipation, diarrhea, nausea and vomiting.  Genitourinary: Negative for dysuria.  Musculoskeletal: Negative for joint pain.  Neurological: Negative for dizziness and headaches.   Exam: Physical Exam  Constitutional: She is oriented to person, place, and time.  HENT:  Nose: No mucosal edema.  Mouth/Throat: No oropharyngeal exudate or posterior oropharyngeal edema.  Eyes: Conjunctivae, EOM and lids are normal. Pupils are equal, round, and reactive to light.  Neck: No JVD present. Carotid bruit is not present. No edema present. No thyroid mass and no thyromegaly present.  Cardiovascular: S1 normal and S2 normal. Exam reveals no gallop.  No murmur heard. Pulses:      Dorsalis pedis pulses are 2+ on the right side, and 2+ on the left side.  Respiratory: No respiratory distress. She has decreased breath sounds in the right lower field and the left lower field. She has no wheezes. She has no rhonchi. She has no rales.  GI: Soft. Bowel sounds are normal. She  exhibits distension. There is no tenderness.  Musculoskeletal:       Right ankle: She exhibits swelling.       Left ankle: She exhibits swelling.  Lymphadenopathy:    She has no cervical adenopathy.  Neurological: She is alert and oriented to person, place, and time. No cranial nerve deficit.  Skin: Skin is warm. Nails show no clubbing.  Slight bleeding at the tunneled catheter site dressing.  Chronic lower extremity discoloration  Psychiatric: She has a normal mood and affect.      Data Reviewed: Basic Metabolic Panel: Recent Labs  Lab 04/15/17 0550 04/18/17 0432 04/21/17 0601  NA 138 137 130*  K 4.7 4.7 4.4  CL 94* 95* 90*  CO2 32 32 30  GLUCOSE 229* 199* 188*  BUN 65* 72* 56*  CREATININE 4.17* 4.30* 3.93*  CALCIUM 8.7* 8.5* 8.5*  PHOS 5.2*  --   --    CBC: Recent Labs  Lab 04/16/17 1100 04/18/17 0432 04/21/17 0601  WBC 7.2 8.2 8.3  HGB 8.3* 8.5* 8.0*  HCT 27.2* 27.2* 26.0*  MCV 87.2 86.3 85.9  PLT 349 356 321   BNP (last 3 results) Recent Labs    01/07/17 1039 03/03/17 1114 03/27/17 0702  BNP 456.0* 617.0* 577.0*     CBG: Recent Labs  Lab 04/20/17 0808 04/20/17 1123 04/20/17 1646 04/20/17 2030 04/21/17 0735  GLUCAP 179* 178* 192* 213* 194*     Scheduled Meds: . aspirin EC  81 mg Oral  Daily  . atorvastatin  80 mg Oral q1800  . [START ON 04/22/2017] epoetin (EPOGEN/PROCRIT) injection  4,000 Units Intravenous Q T,Th,Sa-HD  . feeding supplement (NEPRO CARB STEADY)  237 mL Oral TID WC  . heparin  2,000 Units Intravenous Once  . heparin  5,000 Units Subcutaneous Q8H  . hydrocerin   Topical BID  . insulin aspart  0-15 Units Subcutaneous TID WC  . insulin aspart  0-5 Units Subcutaneous QHS  . insulin aspart  3 Units Subcutaneous TID WC  . insulin glargine  12 Units Subcutaneous QHS  . multivitamin  1 tablet Oral QHS  . multivitamin with minerals  1 tablet Oral Daily  . polyethylene glycol  17 g Oral Daily  . sodium chloride  2 spray Each Nare  QID  . sodium chloride flush  3 mL Intravenous Q12H  . torsemide  100 mg Oral Daily   Continuous Infusions: . sodium chloride      Assessment/Plan:  1. Acute on chronic hypoxic hypercarbic respiratory failure secondary to acute CHF exacerbation.  Patient currently on 4 L of oxygen, wean off as tolerated 2. Acute on chronic diastolic congestive heart failure.  Dialysis to help out with fluid management.  Continue metoprolol. 3. Acute kidney injury on end-stage renal disease with hyponatremia.  Dialysis currently needed for fluid management.  Patient needs a dialysis outpatient slot.   Tunneled dialysis catheter has been placed Patient was able to sit in a  bariatric dialysis chair  but she will need to be ready for transfer from her wheelchair to the outpatient dialysis chair.   4. Morbid obesity.  Likely has sleep apnea underlying also.  Recommend outpatient sleep study. 5. Constipation on MiraLAX 6. Essential hypertension on metoprolol 7. Hyperlipidemia unspecified on atorvastatin 8.  anxiety.  Patient states that she does not want the Cymbalta.   Xanax as needed 9. Type 2 diabetes mellitus on Lantus and sliding scale 10. Weakness.  Patient prefers working with physical therapy prior to her dialysis Discussed with RN and physical therapist to work with her before her dialysis 32.  Gradual decline in vision probably from diabetic retinopathy and patient failed outpatient ophthalmology follow-up , consulted ophthalmology and discussed with Dr. Edison Pace he is aware of the consult, recommending outpatient follow-up as  he cannot bring the necessary equipment to the hospital  Plan of care discussed in detail with the patient and her husband at bedside. Code Status:     Code Status Orders  (From admission, onward)        Start     Ordered   03/24/17 1951  Full code  Continuous     03/24/17 1952    Code Status History    Date Active Date Inactive Code Status Order ID Comments User Context    03/03/2017 14:20 03/10/2017 21:31 Full Code [id number]  Fritzi Mandes, MD ED   01/07/2017 17:25 01/10/2017 19:00 Full Code [id number]  Demetrios Loll, MD Inpatient   11/08/2016 04:50 11/16/2016 17:17 Full Code [id number]  Harvie Bridge, DO Inpatient   06/25/2016 14:47 06/29/2016 18:15 Full Code [id number]  Hillary Bow, MD ED   08/09/2015 17:57 08/13/2015 17:21 Full Code [id number]  Bettey Costa, MD Inpatient   12/31/2014 20:25 01/03/2015 17:49 Full Code [id number]  Lytle Butte, MD ED   12/15/2014 11:54 12/19/2014 16:57 Full Code [id number]  Hillary Bow, MD ED      Disposition Plan:  if patient is able to sit up with dialysis and tolerated well then hopefully she  can go out to rehab once an outpatient dialysis slot is obtained.  Consultants:  Nephrology  Vascular surgery  Time spent: 26 minutes.  Nicholes Mango  [employer]

## 2017-04-21 NOTE — Progress Notes (Signed)
Physical Therapy Treatment Patient Details Name: [name] MRN: [id number] DOB: [birth year] Today's Date: 04/21/2017    History of Present Illness Pt is a51 y.o.femalewith a known history of systolic CHF, hypertension, diabetes, morbid obesity who was recently discharged from the hospital returns from CHF clinic due to weight gain, worsening shortness of breath. Attempts were made to start IV Lasix through home health but were unsuccessful. Also due to her severe shortness of breath was sent to the emergency room. Here chest x-ray shows pulmonary edema and right effusion which is chronic. Patient has been compliant with medications, fluid restriction and salt restriction.  Assessment includes: Acute on chronic diastolic congestive heart failure, acute on chronic respiratory failure with tachypnea due to CHF, HTN, DM, elevated troponin likely due to CHF, and CKD stage IV.    PT Comments    Patient with much improved affect, participation this date.  Performing all functional activities with significantly less assist than required during previous sessions.  Able to complete bed mobility with min assist; sit/stand, basic transfers and very short-distance gait (4 steps) from bed/recliner with BRW, min assist +2.  Min manual assist to ensure bilat knee stability, but no overt buckling noted during transfer.  Patient excited by progress.  Will continue to emphasize standing and OOB activities as appropriate, with progression to gait as able (not appropriate this date due to extreme fatigue/desaturation).  Did note significant desaturation (to 77-78% on 4L) with minimal exertion; requiring seated rest break, pursed lip breathing and breathing treatment for recovery >90%.  RN informed/aware/at bedside to monitor.    Follow Up Recommendations  SNF     Equipment Recommendations       Recommendations for Other Services       Precautions / Restrictions Precautions Precautions:  Fall Precaution Comments: L IJ temp dialysis catheter (cleared for OOB to chair per nephrology) Restrictions Weight Bearing Restrictions: No    Mobility  Bed Mobility Overal bed mobility: Needs Assistance Bed Mobility: Supine to Sit     Supine to sit: Min assist     General bed mobility comments: transition towards R  Transfers Overall transfer level: Needs assistance Equipment used: Rolling walker (2 wheeled) Transfers: Sit to/from Stand Sit to Stand: Min assist;+2 physical assistance         General transfer comment: broad BOS, heavy use of momentum, bilat UEs required for lift off and stabilization.  Optimal performance when patient allowed to initiate at her own pace, in her own method (to maximize her comfort, decrease anxiety)  Ambulation/Gait Ambulation/Gait assistance: Min assist;+2 physical assistance Ambulation Distance (Feet): 4 Feet Assistive device: Rolling walker (2 wheeled)       General Gait Details: step-to gait pattern from bed/chair; maintains forward flexed posture with heavy WBing bilat UEs. Min manual facilitation bilat LEs to ensure stability; no overt buckling noted.  Does fatigue quickly with exertion; unsafe to attempt gait beyond bed/chair at this time.   Stairs            Wheelchair Mobility    Modified Rankin (Stroke Patients Only)       Balance Overall balance assessment: Needs assistance Sitting-balance support: No upper extremity supported;Feet supported Sitting balance-Leahy Scale: Good     Standing balance support: Bilateral upper extremity supported Standing balance-Leahy Scale: Fair                              Cognition Arousal/Alertness: Awake/alert Behavior During Therapy:  WFL for tasks assessed/performed Overall Cognitive Status: Within Functional Limits for tasks assessed                                 General Comments: much brighter, more invested and interactive affect this date       Exercises Other Exercises Other Exercises: Seated LE therex, 1x15: ankle pumps, LAQs for LE strength/flexibility. Other Exercises: Educated in pursed lip breathing for relaxation and anxiety management; patient very fearful of mobility attempts. Other Exercises: Sit/stand x3 with RW, min assist +2-broad BOS, heavy use of momentum and bilat UEs to complete transfer    General Comments        Pertinent Vitals/Pain Pain Assessment: No/denies pain    Home Living                      Prior Function            PT Goals (current goals can now be found in the care plan section) Acute Rehab PT Goals Patient Stated Goal: To walk better PT Goal Formulation: With patient Time For Goal Achievement: 04/27/17 Potential to Achieve Goals: Fair Progress towards PT goals: Progressing toward goals    Frequency    Min 2X/week      PT Plan Current plan remains appropriate    Co-evaluation              AM-PAC PT "6 Clicks" Daily Activity  Outcome Measure  Difficulty turning over in bed (including adjusting bedclothes, sheets and blankets)?: Unable Difficulty moving from lying on back to sitting on the side of the bed? : Unable Difficulty sitting down on and standing up from a chair with arms (e.g., wheelchair, bedside commode, etc,.)?: Unable Help needed moving to and from a bed to chair (including a wheelchair)?: A Lot Help needed walking in hospital room?: A Lot Help needed climbing 3-5 steps with a railing? : Total 6 Click Score: 8    End of Session Equipment Utilized During Treatment: Gait belt;Oxygen Activity Tolerance: Patient tolerated treatment well Patient left: with call bell/phone within reach;in chair;with nursing/sitter in room(completing breathing treatment, preparing to leave unit for dialysis) Nurse Communication: Mobility status PT Visit Diagnosis: Difficulty in walking, not elsewhere classified (R26.2);Muscle weakness (generalized) (M62.81)      Time: 7425-9563 PT Time Calculation (min) (ACUTE ONLY): 30 min  Charges:  $Therapeutic Exercise: 8-22 mins $Therapeutic Activity: 8-22 mins                    G Codes:       Vila Dory H. Owens Shark, PT, DPT, NCS 04/21/17, 10:50 AM [phone]

## 2017-04-21 NOTE — Progress Notes (Signed)
Refusing bipap.

## 2017-04-21 NOTE — Progress Notes (Signed)
Post HD Tx  

## 2017-04-21 NOTE — Clinical Social Work Note (Signed)
CSW was informed that [facility] and [facility] can not accept patient.  CSW attempted to Games developer, and unable to leave a message on voice mail.  CSW to attempt to call in the morning.  Jones Broom. Byron, MSW, [place]  04/21/2017 4:56 PM

## 2017-04-21 NOTE — Progress Notes (Signed)
[facility]  ROUNDING NOTE   Subjective:   Seen and examined on hemodialysis. Tolerating treatment well. UF goal of 5 liters. Seated in chair.     HEMODIALYSIS FLOWSHEET:  Blood Flow Rate (mL/min): 300 mL/min Arterial Pressure (mmHg): -140 mmHg Venous Pressure (mmHg): 160 mmHg Transmembrane Pressure (mmHg): 70 mmHg Ultrafiltration Rate (mL/min): 1340 mL/min Dialysate Flow Rate (mL/min): 600 ml/min Conductivity: Machine : 13.7 Conductivity: Machine : 13.7 Dialysis Fluid Bolus: Normal Saline Bolus Amount (mL): 250 mL    Objective:  Vital signs in last 24 hours:  Temp:  [98 F (36.7 C)-98.5 F (36.9 C)] 98.4 F (36.9 C) (02/25 0735) Pulse Rate:  [95-118] 118 (02/25 1445) Resp:  [13-25] 13 (02/25 1445) BP: (124-166)/(65-113) 128/81 (02/25 1445) SpO2:  [77 %-100 %] 94 % (02/25 1445) Weight:  [164 kg (361 lb 8.9 oz)] 164 kg (361 lb 8.9 oz) (02/25 0343)  Weight change: 48.4 kg (106 lb 12.1 oz) Filed Weights   04/19/17 1830 04/20/17 0412 04/21/17 0343  Weight: 115.6 kg (254 lb 12.8 oz) (!) 182 kg (401 lb 3.8 oz) (!) 164 kg (361 lb 8.9 oz)    Intake/Output: I/O last 3 completed shifts: In: 480 [P.O.:480] Out: 101 [Urine:100; Stool:1]   Intake/Output this shift:  Total I/O In: 240 [P.O.:240] Out: 50 [Urine:50]  Physical Exam: General: NAD, sitting in chair  Head: Normocephalic, atraumatic. Moist oral mucosal membranes  Eyes: Anicteric, PERRL  Neck: Supple, trachea midline  Lungs:  Clear to auscultation  Heart: Regular rate and rhythm  Abdomen:  Soft, nontender,   Extremities:  ++ peripheral edema.  Neurologic: Nonfocal, moving all four extremities  Skin: No lesions  Access: Left IJ permcath    Basic Metabolic Panel: Recent Labs  Lab 04/15/17 0550 04/18/17 0432 04/21/17 0601  NA 138 137 130*  K 4.7 4.7 4.4  CL 94* 95* 90*  CO2 32 32 30  GLUCOSE 229* 199* 188*  BUN 65* 72* 56*  CREATININE 4.17* 4.30* 3.93*  CALCIUM 8.7* 8.5* 8.5*  PHOS  5.2*  --   --     Liver Function Tests: Recent Labs  Lab 04/15/17 0550  ALBUMIN 2.8*   No results for input(s): LIPASE, AMYLASE in the last 168 hours. No results for input(s): AMMONIA in the last 168 hours.  CBC: Recent Labs  Lab 04/16/17 1100 04/18/17 0432 04/21/17 0601  WBC 7.2 8.2 8.3  HGB 8.3* 8.5* 8.0*  HCT 27.2* 27.2* 26.0*  MCV 87.2 86.3 85.9  PLT 349 356 321    Cardiac Enzymes: No results for input(s): CKTOTAL, CKMB, CKMBINDEX, TROPONINI in the last 168 hours.  BNP: Invalid input(s): POCBNP  CBG: Recent Labs  Lab 04/20/17 1123 04/20/17 1646 04/20/17 2030 04/21/17 0735 04/21/17 1151  GLUCAP 178* 192* 213* 194* 204*    Microbiology: Results for orders placed or performed during the hospital encounter of 03/24/17  MRSA PCR Screening     Status: None   Collection Time: 03/25/17  5:50 AM  Result Value Ref Range Status   MRSA by PCR NEGATIVE NEGATIVE Final    Comment:        The GeneXpert MRSA Assay (FDA approved for NASAL specimens only), is one component of a comprehensive MRSA colonization surveillance program. It is not intended to diagnose MRSA infection nor to guide or monitor treatment for MRSA infections. Performed at [facility], [address] [place], [place] [address]   MRSA PCR Screening     Status: None   Collection Time:  04/18/17 12:37 PM  Result Value Ref Range Status   MRSA by PCR NEGATIVE NEGATIVE Final    Comment:        The GeneXpert MRSA Assay (FDA approved for NASAL specimens only), is one component of a comprehensive MRSA colonization surveillance program. It is not intended to diagnose MRSA infection nor to guide or monitor treatment for MRSA infections. Performed at [facility], [address] [place], [place] [address]     Coagulation Studies: No results for input(s): LABPROT, INR in the last 72 hours.  Urinalysis: No results for input(s): COLORURINE, LABSPEC, PHURINE, GLUCOSEU,  HGBUR, BILIRUBINUR, KETONESUR, PROTEINUR, UROBILINOGEN, NITRITE, LEUKOCYTESUR in the last 72 hours.  Invalid input(s): APPERANCEUR    Imaging: No results found.   Medications:   . sodium chloride     . aspirin EC  81 mg Oral Daily  . atorvastatin  80 mg Oral q1800  . [START ON 04/22/2017] epoetin (EPOGEN/PROCRIT) injection  4,000 Units Intravenous Q T,Th,Sa-HD  . feeding supplement (NEPRO CARB STEADY)  237 mL Oral TID WC  . heparin  2,000 Units Intravenous Once  . heparin  5,000 Units Subcutaneous Q8H  . hydrocerin   Topical BID  . insulin aspart  0-15 Units Subcutaneous TID WC  . insulin aspart  0-5 Units Subcutaneous QHS  . insulin aspart  3 Units Subcutaneous TID WC  . insulin glargine  12 Units Subcutaneous QHS  . multivitamin  1 tablet Oral QHS  . multivitamin with minerals  1 tablet Oral Daily  . polyethylene glycol  17 g Oral Daily  . sodium chloride  2 spray Each Nare QID  . sodium chloride flush  3 mL Intravenous Q12H  . torsemide  100 mg Oral Daily   sodium chloride, acetaminophen **OR** acetaminophen, ALPRAZolam, cyclobenzaprine, diphenhydrAMINE, ipratropium-albuterol, lactulose, ondansetron **OR** ondansetron (ZOFRAN) IV, oxyCODONE, sodium chloride flush  Assessment/ Plan:  [name] is a 47 y.o. black female with end stage renal disease new start, diastolic congestive heart failure, hypertension, diabetes mellitus type II, anemia,   1. End-stage renal disease: 24 hr urine Creatinine clearance is 16 cc/min (04/04/2017). Seen and examined on hemodialysis. Tolerating treatment well. Over 22.5 kg removed with dialysis since initiated. - Daily dialysis - Consult vascular for possible AVF placement before discharge. Vein mapping ordered.   2. Hypertension and Acute on chronic diastolic congestive heart failure (grade 1 diastolic dysfunction)  - Continue to perform perform hemodialysis with ultrafiltration.   - torsemide 100mg  daily PO  3. Anemia of chronic  kidney disease: hemoglobin 8. With iron deficiency - EPO with HD treatments TTS - IV Iron TTS  4. Secondary Hyperparathyroidism: Calcium and phosphorus at goal - order new PTH.    LOS: 28 [address] Seefeldt 2/25/20193:28 PM

## 2017-04-22 ENCOUNTER — Inpatient Hospital Stay: Payer: Medicaid Other

## 2017-04-22 LAB — GLUCOSE, CAPILLARY
GLUCOSE-CAPILLARY: 166 mg/dL — AB (ref 65–99)
Glucose-Capillary: 166 mg/dL — ABNORMAL HIGH (ref 65–99)
Glucose-Capillary: 168 mg/dL — ABNORMAL HIGH (ref 65–99)
Glucose-Capillary: 173 mg/dL — ABNORMAL HIGH (ref 65–99)

## 2017-04-22 LAB — PARATHYROID HORMONE, INTACT (NO CA): PTH: 134 pg/mL — ABNORMAL HIGH (ref 15–65)

## 2017-04-22 MED ORDER — METOPROLOL TARTRATE 50 MG PO TABS
50.0000 mg | ORAL_TABLET | Freq: Two times a day (BID) | ORAL | Status: DC
  Administered 2017-04-23: 50 mg via ORAL
  Filled 2017-04-22 (×2): qty 1

## 2017-04-22 NOTE — Progress Notes (Signed)
Post HD Tx  

## 2017-04-22 NOTE — Progress Notes (Signed)
[facility]  ROUNDING NOTE   Subjective:   Seen and examined on hemodialysis. Tolerating treatment well. UF goal of 5 liters. Seated in chair.     HEMODIALYSIS FLOWSHEET:  Blood Flow Rate (mL/min): 300 mL/min Arterial Pressure (mmHg): -140 mmHg Venous Pressure (mmHg): 160 mmHg Transmembrane Pressure (mmHg): 70 mmHg Ultrafiltration Rate (mL/min): 1340 mL/min Dialysate Flow Rate (mL/min): 600 ml/min Conductivity: Machine : 13.7 Conductivity: Machine : 13.7 Dialysis Fluid Bolus: Normal Saline Bolus Amount (mL): 250 mL    Objective:  Vital signs in last 24 hours:  Temp:  [97.5 F (36.4 C)-98.9 F (37.2 C)] 98.3 F (36.8 C) (02/26 0739) Pulse Rate:  [104-108] 108 (02/26 0739) Resp:  [16-20] 16 (02/26 0739) BP: (138-157)/(76-92) 147/82 (02/26 1522) SpO2:  [90 %-100 %] 96 % (02/26 0739) Weight:  [129.5 kg (285 lb 6.4 oz)] 129.5 kg (285 lb 6.4 oz) (02/26 1100)  Weight change:  Filed Weights   04/20/17 0412 04/21/17 0343 04/22/17 1100  Weight: (!) 182 kg (401 lb 3.8 oz) (!) 164 kg (361 lb 8.9 oz) 129.5 kg (285 lb 6.4 oz)    Intake/Output: I/O last 3 completed shifts: In: 240 [P.O.:240] Out: 50 [Urine:50]   Intake/Output this shift:  No intake/output data recorded.  Physical Exam: General: NAD, sitting in chair  Head: Normocephalic, atraumatic. Moist oral mucosal membranes  Eyes: Anicteric, PERRL  Neck: Supple, trachea midline  Lungs:  Clear to auscultation  Heart: Regular rate and rhythm  Abdomen:  Soft, nontender,   Extremities:  + peripheral edema.  Neurologic: Nonfocal, moving all four extremities  Skin: No lesions  Access: Left IJ permcath    Basic Metabolic Panel: Recent Labs  Lab 04/18/17 0432 04/21/17 0601  NA 137 130*  K 4.7 4.4  CL 95* 90*  CO2 32 30  GLUCOSE 199* 188*  BUN 72* 56*  CREATININE 4.30* 3.93*  CALCIUM 8.5* 8.5*    Liver Function Tests: No results for input(s): AST, ALT, ALKPHOS, BILITOT, PROT, ALBUMIN in the  last 168 hours. No results for input(s): LIPASE, AMYLASE in the last 168 hours. No results for input(s): AMMONIA in the last 168 hours.  CBC: Recent Labs  Lab 04/16/17 1100 04/18/17 0432 04/21/17 0601  WBC 7.2 8.2 8.3  HGB 8.3* 8.5* 8.0*  HCT 27.2* 27.2* 26.0*  MCV 87.2 86.3 85.9  PLT 349 356 321    Cardiac Enzymes: No results for input(s): CKTOTAL, CKMB, CKMBINDEX, TROPONINI in the last 168 hours.  BNP: Invalid input(s): POCBNP  CBG: Recent Labs  Lab 04/21/17 1151 04/21/17 1743 04/21/17 2149 04/22/17 0741 04/22/17 1343  GLUCAP 204* 132* 147* 166* 166*    Microbiology: Results for orders placed or performed during the hospital encounter of 03/24/17  MRSA PCR Screening     Status: None   Collection Time: 03/25/17  5:50 AM  Result Value Ref Range Status   MRSA by PCR NEGATIVE NEGATIVE Final    Comment:        The GeneXpert MRSA Assay (FDA approved for NASAL specimens only), is one component of a comprehensive MRSA colonization surveillance program. It is not intended to diagnose MRSA infection nor to guide or monitor treatment for MRSA infections. Performed at [facility], [address] [place], [place] [address]   MRSA PCR Screening     Status: None   Collection Time: 04/18/17 12:37 PM  Result Value Ref Range Status   MRSA by PCR NEGATIVE NEGATIVE Final    Comment:  The GeneXpert MRSA Assay (FDA approved for NASAL specimens only), is one component of a comprehensive MRSA colonization surveillance program. It is not intended to diagnose MRSA infection nor to guide or monitor treatment for MRSA infections. Performed at [facility], [address] [place], [place] [address]     Coagulation Studies: No results for input(s): LABPROT, INR in the last 72 hours.  Urinalysis: No results for input(s): COLORURINE, LABSPEC, PHURINE, GLUCOSEU, HGBUR, BILIRUBINUR, KETONESUR, PROTEINUR, UROBILINOGEN, NITRITE, LEUKOCYTESUR in the  last 72 hours.  Invalid input(s): APPERANCEUR    Imaging: No results found.   Medications:   . sodium chloride    . iron sucrose     . aspirin EC  81 mg Oral Daily  . atorvastatin  80 mg Oral q1800  . epoetin (EPOGEN/PROCRIT) injection  4,000 Units Intravenous Q T,Th,Sa-HD  . feeding supplement (NEPRO CARB STEADY)  237 mL Oral TID WC  . heparin  2,000 Units Intravenous Once  . heparin  5,000 Units Subcutaneous Q8H  . hydrocerin   Topical BID  . insulin aspart  0-15 Units Subcutaneous TID WC  . insulin aspart  0-5 Units Subcutaneous QHS  . insulin aspart  3 Units Subcutaneous TID WC  . insulin glargine  12 Units Subcutaneous QHS  . metoprolol tartrate  50 mg Oral BID  . multivitamin  1 tablet Oral QHS  . multivitamin with minerals  1 tablet Oral Daily  . polyethylene glycol  17 g Oral Daily  . sodium chloride  2 spray Each Nare QID  . sodium chloride flush  3 mL Intravenous Q12H  . torsemide  100 mg Oral Daily   sodium chloride, acetaminophen **OR** acetaminophen, ALPRAZolam, cyclobenzaprine, diphenhydrAMINE, ipratropium-albuterol, lactulose, ondansetron **OR** ondansetron (ZOFRAN) IV, oxyCODONE, sodium chloride flush  Assessment/ Plan:  [name] is a 47 y.o. black female with end stage renal disease new start, diastolic congestive heart failure, hypertension, diabetes mellitus type II, anemia,   1. End-stage renal disease: 24 hr urine Creatinine clearance is 16 cc/min (04/04/2017). Seen and examined on hemodialysis. Tolerating treatment well. Over 27.5 kg removed with dialysis since initiated. - Next dialysis for Thursday - Consult vascular for possible AVF placement before discharge. Vein mapping ordered.   2. Hypertension and Acute on chronic diastolic congestive heart failure (grade 1 diastolic dysfunction)  - Continue to perform perform hemodialysis with ultrafiltration.   - torsemide 100mg  daily PO  3. Anemia of chronic kidney disease: hemoglobin 8. With  iron deficiency - EPO with HD treatments TTS - IV Iron TTS  4. Secondary Hyperparathyroidism: Calcium and phosphorus at goal - pending PTH.    LOS: [place] 2/26/20194:13 PM

## 2017-04-22 NOTE — Progress Notes (Signed)
Patient ID: [name], female   DOB: 02/05/1971, 47 y.o.   MRN: [id number]  [facility] PROGRESS NOTE  DARIUS FILLINGIM ZCH:[id number] DOB: 02-12-1971 DOA: 03/24/2017 PCP: [name], NP  HPI/Subjective: Patient started working with physical therapy prior to her dialysis Objective: Vitals:   04/22/17 0459 04/22/17 0739  BP: (!) 138/92 138/76  Pulse: (!) 104 (!) 108  Resp:  16  Temp: 98.9 F (37.2 C) 98.3 F (36.8 C)  SpO2: 90% 96%    Filed Weights   04/20/17 0412 04/21/17 0343 04/22/17 1100  Weight: (!) 182 kg (401 lb 3.8 oz) (!) 164 kg (361 lb 8.9 oz) 129.5 kg (285 lb 6.4 oz)    ROS: Review of Systems  Constitutional: Negative for chills and fever.  Eyes: Negative for blurred vision.  Respiratory: Positive for shortness of breath. Negative for cough.   Cardiovascular: Negative for chest pain.  Gastrointestinal: Negative for abdominal pain, constipation, diarrhea, nausea and vomiting.  Genitourinary: Negative for dysuria.  Musculoskeletal: Negative for joint pain.  Neurological: Negative for dizziness and headaches.   Exam: Physical Exam  Constitutional: She is oriented to person, place, and time.  HENT:  Nose: No mucosal edema.  Mouth/Throat: No oropharyngeal exudate or posterior oropharyngeal edema.  Eyes: Conjunctivae, EOM and lids are normal. Pupils are equal, round, and reactive to light.  Neck: No JVD present. Carotid bruit is not present. No edema present. No thyroid mass and no thyromegaly present.  Cardiovascular: S1 normal and S2 normal. Exam reveals no gallop.  No murmur heard. Pulses:      Dorsalis pedis pulses are 2+ on the right side, and 2+ on the left side.  Respiratory: No respiratory distress. She has decreased breath sounds in the right lower field and the left lower field. She has no wheezes. She has no rhonchi. She has no rales.  GI: Soft. Bowel sounds are normal. She exhibits distension. There is no tenderness.  Musculoskeletal:     Right ankle: She exhibits swelling.       Left ankle: She exhibits swelling.  Lymphadenopathy:    She has no cervical adenopathy.  Neurological: She is alert and oriented to person, place, and time. No cranial nerve deficit.  Skin: Skin is warm. Nails show no clubbing.  Slight bleeding at the tunneled catheter site dressing.  Chronic lower extremity discoloration  Psychiatric: She has a normal mood and affect.      Data Reviewed: Basic Metabolic Panel: Recent Labs  Lab 04/18/17 0432 04/21/17 0601  NA 137 130*  K 4.7 4.4  CL 95* 90*  CO2 32 30  GLUCOSE 199* 188*  BUN 72* 56*  CREATININE 4.30* 3.93*  CALCIUM 8.5* 8.5*   CBC: Recent Labs  Lab 04/16/17 1100 04/18/17 0432 04/21/17 0601  WBC 7.2 8.2 8.3  HGB 8.3* 8.5* 8.0*  HCT 27.2* 27.2* 26.0*  MCV 87.2 86.3 85.9  PLT 349 356 321   BNP (last 3 results) Recent Labs    01/07/17 1039 03/03/17 1114 03/27/17 0702  BNP 456.0* 617.0* 577.0*     CBG: Recent Labs  Lab 04/21/17 1151 04/21/17 1743 04/21/17 2149 04/22/17 0741 04/22/17 1343  GLUCAP 204* 132* 147* 166* 166*     Scheduled Meds: . aspirin EC  81 mg Oral Daily  . atorvastatin  80 mg Oral q1800  . epoetin (EPOGEN/PROCRIT) injection  4,000 Units Intravenous Q T,Th,Sa-HD  . feeding supplement (NEPRO CARB STEADY)  237 mL Oral TID WC  . heparin  2,000 Units Intravenous Once  . heparin  5,000 Units Subcutaneous Q8H  . hydrocerin   Topical BID  . insulin aspart  0-15 Units Subcutaneous TID WC  . insulin aspart  0-5 Units Subcutaneous QHS  . insulin aspart  3 Units Subcutaneous TID WC  . insulin glargine  12 Units Subcutaneous QHS  . metoprolol tartrate  50 mg Oral BID  . multivitamin  1 tablet Oral QHS  . multivitamin with minerals  1 tablet Oral Daily  . polyethylene glycol  17 g Oral Daily  . sodium chloride  2 spray Each Nare QID  . sodium chloride flush  3 mL Intravenous Q12H  . torsemide  100 mg Oral Daily   Continuous Infusions: . sodium  chloride    . iron sucrose      Assessment/Plan:  1. Acute on chronic hypoxic hypercarbic respiratory failure secondary to acute CHF exacerbation.  Patient currently on 4 L of oxygen, wean off as tolerated 2. Acute on chronic diastolic congestive heart failure.  Dialysis to help out with fluid management.  Continue metoprolol. 3. Acute kidney injury on end-stage renal disease with hyponatremia.  Dialysis currently needed for fluid management.  Patient needs a dialysis outpatient slot.   Tunneled dialysis catheter has been placed Patient was able to sit in a  bariatric dialysis chair  but she will need to be ready for transfer from her wheelchair to the outpatient dialysis chair.   4. Morbid obesity.  Likely has sleep apnea underlying also.  Recommend outpatient sleep study. 5. Constipation on MiraLAX 6. Essential hypertension on metoprolol 7. Hyperlipidemia unspecified on atorvastatin 8.  anxiety.  Patient states that she does not want the Cymbalta.   Xanax as needed 9. Type 2 diabetes mellitus on Lantus and sliding scale 10. Weakness.  Patient prefers working with physical therapy prior to her dialysis Discussed with RN and physical therapist to work with her before her dialysis 61.  Gradual decline in vision probably from diabetic retinopathy and patient failed outpatient ophthalmology follow-up , consulted ophthalmology and discussed with Dr. Edison Pace he is aware of the consult, recommending outpatient follow-up as  he cannot bring the necessary equipment to the hospital  Plan of care discussed in detail with the patient and her husband at bedside. Code Status:     Code Status Orders  (From admission, onward)        Start     Ordered   03/24/17 1951  Full code  Continuous     03/24/17 1952    Code Status History    Date Active Date Inactive Code Status Order ID Comments User Context   03/03/2017 14:20 03/10/2017 21:31 Full Code [id number]  Fritzi Mandes, MD ED   01/07/2017 17:25  01/10/2017 19:00 Full Code [id number]  Demetrios Loll, MD Inpatient   11/08/2016 04:50 11/16/2016 17:17 Full Code [id number]  Harvie Bridge, DO Inpatient   06/25/2016 14:47 06/29/2016 18:15 Full Code [id number]  Hillary Bow, MD ED   08/09/2015 17:57 08/13/2015 17:21 Full Code [id number]  Bettey Costa, MD Inpatient   12/31/2014 20:25 01/03/2015 17:49 Full Code [id number]  Lytle Butte, MD ED   12/15/2014 11:54 12/19/2014 16:57 Full Code [id number]  Hillary Bow, MD ED      Disposition Plan:  if patient is able to sit up with dialysis and tolerated well then hopefully she can go out to rehab once an outpatient dialysis slot is obtained.  Consultants:  Nephrology  Vascular surgery  Time spent: 26 minutes.  Nicholes Mango  [employer]

## 2017-04-22 NOTE — Progress Notes (Signed)
Pt refused BiPAP, stated she had a rough day and didn't want to try tonight

## 2017-04-22 NOTE — NC FL2 (Addendum)
[place] LEVEL OF CARE SCREENING TOOL     IDENTIFICATION  Patient Name: [name] Birthdate: 07-05-1970 Sex: female Admission Date (Current Location): 03/24/2017  [place] and [place] Number:  [name] [id number] [place] and Address:  [facility], [address] [place], [place] [address]      Provider Number: [id number]  Attending Physician Name and Address:  Nicholes Mango, MD  Relative Name and Phone Number:  [name], [name] [phone]     Current Level of Care: Hospital Recommended Level of Care: [place] Prior Approval Number:    Date Approved/Denied:   PASRR Number: [id number]  Discharge Plan: SNF    Current Diagnoses: Patient Active Problem List   Diagnosis Date Noted  . Palliative care by specialist   . Goals of care, counseling/discussion   . ESRD (end stage renal disease) ([place])   . DNR (do not resuscitate) discussion   . Stage 4 chronic kidney disease ([place])   . Adjustment disorder with mixed anxiety and depressed mood 03/31/2017  . Dysthymia 03/31/2017  . Chronic pain syndrome 03/31/2017  . Palliative care encounter   . Obesity hypoventilation syndrome ([place]) 03/09/2017  . CHF (congestive heart failure) ([place]) 03/03/2017  . Chronic diastolic heart failure ([place]) 01/07/2017  . Pneumonia 01/07/2017  . Acute exacerbation of CHF (congestive heart failure) ([place]) 11/07/2016  . Depression 09/24/2016  . Acute on chronic diastolic heart failure ([place]) 07/04/2016  . COPD with chronic bronchitis ([place]) 07/04/2016  . CKD (chronic kidney disease), stage III ([place]) 06/26/2016  . COPD exacerbation ([place]) 03/25/2016  . Anxiety 10/02/2015  . Asthma 08/30/2015  . Poorly controlled type 2 diabetes mellitus ([place]) 03/13/2015  . Iron deficiency anemia 02/17/2015  . Obesity, morbid ([place])   . Shortness of breath   . Essential hypertension   . Hypertensive heart disease with congestive heart failure ([place])  12/18/2014  . Diabetes ([place]) 12/15/2014    Orientation RESPIRATION BLADDER Height & Weight     Self, Time, Situation, Place  O2(4L) Continent Weight: (it showed 184kg to far off from yesterday) Height:  5\' 4"  (162.6 cm)  BEHAVIORAL SYMPTOMS/MOOD NEUROLOGICAL BOWEL NUTRITION STATUS      Continent Diet(Renal Carb Modified 1267ml restricttions)  AMBULATORY STATUS COMMUNICATION OF NEEDS Skin   Limited Assist Verbally Surgical wounds                       Personal Care Assistance Level of Assistance  Bathing, Feeding, Dressing Bathing Assistance: Limited assistance Feeding assistance: Independent Dressing Assistance: Limited assistance     Functional Limitations Info  Sight, Hearing, Speech Sight Info: Adequate Hearing Info: Adequate Speech Info: Adequate    SPECIAL CARE FACTORS FREQUENCY  PT (By licensed PT), OT (By licensed OT)     PT Frequency: 5x a week OT Frequency: 5x a week            Contractures Contractures Info: Not present    Additional Factors Info  Code Status, Allergies, Insulin Sliding Scale, Isolation Precautions Code Status Info: Full Code Allergies Info: DUST MITE EXTRACT, MOLD EXTRACT TRICHOPHYTON, FERAHEME FERUMOXYTOL, POLLEN EXTRACT, SULFA ANTIBIOTICS  Psychotropic Info: citalopram (CELEXA) tablet 40 mg  Insulin Sliding Scale Info: insulin aspart (novoLOG) injection 0-15 Units 3x a day with meals Isolation Precautions Info: Contact precautions for MRSA     Current Medications (04/22/2017):  This is the current hospital active medication list Current Facility-Administered Medications  Medication Dose Route Frequency Provider Last Rate Last Dose  .  0.9 %  sodium chloride infusion  250 mL Intravenous PRN Sudini, Alveta Heimlich, MD      . acetaminophen (TYLENOL) tablet 650 mg  650 mg Oral Q6H PRN Hillary Bow, MD   650 mg at 04/21/17 2253   Or  . acetaminophen (TYLENOL) suppository 650 mg  650 mg Rectal Q6H PRN Hillary Bow, MD      . ALPRAZolam  Duanne Moron) tablet 0.25 mg  0.25 mg Oral TID PRN Loletha Grayer, MD   0.25 mg at 04/19/17 1347  . aspirin EC tablet 81 mg  81 mg Oral Daily Hillary Bow, MD   81 mg at 04/22/17 0813  . atorvastatin (LIPITOR) tablet 80 mg  80 mg Oral q1800 Hillary Bow, MD   80 mg at 04/21/17 2155  . cyclobenzaprine (FLEXERIL) tablet 7.5 mg  7.5 mg Oral TID PRN Lance Coon, MD   7.5 mg at 04/21/17 2159  . diphenhydrAMINE (BENADRYL) capsule 25 mg  25 mg Oral Q8H PRN Hillary Bow, MD   25 mg at 04/22/17 0813  . epoetin alfa (EPOGEN,PROCRIT) injection 4,000 Units  4,000 Units Intravenous Q T,Th,Sa-HD Murlean Iba, MD   4,000 Units at 04/19/17 1524  . feeding supplement (NEPRO CARB STEADY) liquid 237 mL  237 mL Oral TID WC Mody, Sital, MD   237 mL at 04/22/17 0815  . heparin bolus via infusion 2,000 Units  2,000 Units Intravenous Once Murlean Iba, MD      . heparin injection 5,000 Units  5,000 Units Subcutaneous Q8H Sudini, Srikar, MD   5,000 Units at 04/21/17 2155  . hydrocerin (EUCERIN) cream   Topical BID Dellinger, Haynes Dage L, PA-C      . insulin aspart (novoLOG) injection 0-15 Units  0-15 Units Subcutaneous TID WC Hillary Bow, MD   3 Units at 04/22/17 [phone]  . insulin aspart (novoLOG) injection 0-5 Units  0-5 Units Subcutaneous QHS Hillary Bow, MD   2 Units at 04/20/17 2040  . insulin aspart (novoLOG) injection 3 Units  3 Units Subcutaneous TID WC Demetrios Loll, MD   3 Units at 04/22/17 [phone]  . insulin glargine (LANTUS) injection 12 Units  12 Units Subcutaneous QHS Hillary Bow, MD   12 Units at 04/21/17 2155  . ipratropium-albuterol (DUONEB) 0.5-2.5 (3) MG/3ML nebulizer solution 3 mL  3 mL Nebulization Q4H PRN Murlean Iba, MD   3 mL at 04/21/17 2210  . iron sucrose (VENOFER) 100 mg in sodium chloride 0.9 % 100 mL IVPB  100 mg Intravenous Q T,Th,Sa-HD Kolluru, Sarath, MD      . lactulose (CHRONULAC) 10 GM/15ML solution 20 g  20 g Oral Daily PRN Loletha Grayer, MD   20 g at 04/19/17 2017  .  multivitamin (RENA-VIT) tablet 1 tablet  1 tablet Oral QHS [name], MD   1 tablet at 04/21/17 2154  . multivitamin with minerals tablet 1 tablet  1 tablet Oral Daily Bettey Costa, MD   1 tablet at 04/22/17 0813  . ondansetron (ZOFRAN) tablet 4 mg  4 mg Oral Q6H PRN Hillary Bow, MD       Or  . ondansetron (ZOFRAN) injection 4 mg  4 mg Intravenous Q6H PRN Sudini, Srikar, MD      . oxyCODONE (Oxy IR/ROXICODONE) immediate release tablet 5 mg  5 mg Oral Q6H PRN Loletha Grayer, MD   5 mg at 04/22/17 0455  . polyethylene glycol (MIRALAX / GLYCOLAX) packet 17 g  17 g Oral Daily Loletha Grayer, MD   17 g at 04/21/17  3748  . sodium chloride (OCEAN) 0.65 % nasal spray 2 spray  2 spray Each Nare QID Amelia Jo, MD   2 spray at 04/22/17 [phone]  . sodium chloride flush (NS) 0.9 % injection 3 mL  3 mL Intravenous Q12H Hillary Bow, MD   3 mL at 04/22/17 0815  . sodium chloride flush (NS) 0.9 % injection 3 mL  3 mL Intravenous PRN Sudini, Alveta Heimlich, MD      . torsemide (DEMADEX) tablet 100 mg  100 mg Oral Daily Murlean Iba, MD   100 mg at 04/20/17 8675     Discharge Medications: Please see discharge summary for a list of discharge medications.  Relevant Imaging Results:  Relevant Lab Results:   Additional Information SSN [id number]  Ross Ludwig, [place]

## 2017-04-22 NOTE — Progress Notes (Signed)
Pt stated that she is claustrophobic, pt kept mask on for approx 5 min before having to remove it.

## 2017-04-22 NOTE — Progress Notes (Signed)
HD Tx started  

## 2017-04-22 NOTE — Progress Notes (Signed)
Physical Therapy Treatment Patient Details Name: [name] MRN: [id number] DOB: 1970-08-04 Today's Date: 04/22/2017    History of Present Illness Pt is a50 y.o.femalewith a known history of systolic CHF, hypertension, diabetes, morbid obesity who was recently discharged from the hospital returns from CHF clinic due to weight gain, worsening shortness of breath. Attempts were made to start IV Lasix through home health but were unsuccessful. Also due to her severe shortness of breath was sent to the emergency room. Here chest x-ray shows pulmonary edema and right effusion which is chronic. Patient has been compliant with medications, fluid restriction and salt restriction.  Assessment includes: Acute on chronic diastolic congestive heart failure, acute on chronic respiratory failure with tachypnea due to CHF, HTN, DM, elevated troponin likely due to CHF, and CKD stage IV.    PT Comments    Pt awaiting transport to [place] and dialysis so session limited.  She did have overall increased confidence this session.  She was able to transition to edge of bed with rail with general ease.  Once sitting able to sit without support.  She stood on standing scale for weight with nurse tech with min guard +2.  After sitting to remove scale, she was able to transfer to recliner at bedside with min guard +2 with no buckling of LOB noted.     Follow Up Recommendations  SNF     Equipment Recommendations       Recommendations for Other Services       Precautions / Restrictions Precautions Precautions: Fall Precaution Comments: L IJ temp dialysis catheter (cleared for OOB to chair per nephrology) Restrictions Weight Bearing Restrictions: No    Mobility  Bed Mobility Overal bed mobility: Needs Assistance Bed Mobility: Supine to Sit Rolling: +2 for safety/equipment;Supervision         General bed mobility comments: with general ease today.  used bedrail to assist.  Transfers Overall  transfer level: Needs assistance Equipment used: Rolling walker (2 wheeled) Transfers: Sit to/from Stand Sit to Stand: Min guard;Min assist;+2 safety/equipment         General transfer comment: no buckling or LOB noted.  Overall good control and technique.    Ambulation/Gait Ambulation/Gait assistance: +2 physical assistance;Min guard;Min assist Ambulation Distance (Feet): 4 Feet Assistive device: Rolling walker (2 wheeled) Gait Pattern/deviations: Step-to pattern;Trunk flexed;Wide base of support   Gait velocity interpretation: Below normal speed for age/gender General Gait Details: transfered to chair at bedside.  Further gait limited as transport team was waiting to take pt to [place] and dialysis   Stairs            Wheelchair Mobility    Modified Rankin (Stroke Patients Only)       Balance Overall balance assessment: Needs assistance Sitting-balance support: No upper extremity supported;Feet supported Sitting balance-Leahy Scale: Good     Standing balance support: Bilateral upper extremity supported Standing balance-Leahy Scale: Fair                              Cognition Arousal/Alertness: Awake/alert Behavior During Therapy: WFL for tasks assessed/performed Overall Cognitive Status: Within Functional Limits for tasks assessed                                        Exercises      General Comments  Pertinent Vitals/Pain Pain Assessment: No/denies pain Pain Location: voiced no complaints    Home Living                      Prior Function            PT Goals (current goals can now be found in the care plan section) Progress towards PT goals: Progressing toward goals    Frequency    Min 2X/week      PT Plan Current plan remains appropriate    Co-evaluation              AM-PAC PT "6 Clicks" Daily Activity  Outcome Measure  Difficulty turning over in bed (including adjusting bedclothes,  sheets and blankets)?: A Little Difficulty moving from lying on back to sitting on the side of the bed? : A Little Difficulty sitting down on and standing up from a chair with arms (e.g., wheelchair, bedside commode, etc,.)?: Unable Help needed moving to and from a bed to chair (including a wheelchair)?: A Lot Help needed walking in hospital room?: A Lot Help needed climbing 3-5 steps with a railing? : Total 6 Click Score: 12    End of Session Equipment Utilized During Treatment: Gait belt;Oxygen Activity Tolerance: Patient tolerated treatment well Patient left: in chair;with family/visitor present Nurse Communication: Mobility status       Time: 1007-1219 PT Time Calculation (min) (ACUTE ONLY): 12 min  Charges:  $Therapeutic Activity: 8-22 mins                    G Codes:       Chesley Noon, PTA 04/22/17, 11:22 AM

## 2017-04-22 NOTE — Clinical Social Work Note (Signed)
CSW was informed that patient was able to walk 4 feet in therapy yesterday and she has been sitting for dialysis.  CSW refaxed updated therapy notes and FL2 to SNFs in [place], [place], [place], [place], and [place].  Awaiting bed offers, patient is still agreeable to going to SNF for rehab.  Jones Broom. [place], MSW, [place]  04/22/2017 9:51 AM

## 2017-04-23 LAB — GLUCOSE, CAPILLARY
Glucose-Capillary: 184 mg/dL — ABNORMAL HIGH (ref 65–99)
Glucose-Capillary: 229 mg/dL — ABNORMAL HIGH (ref 65–99)
Glucose-Capillary: 172 mg/dL — ABNORMAL HIGH (ref 65–99)
Glucose-Capillary: 119 mg/dL — ABNORMAL HIGH (ref 65–99)

## 2017-04-23 NOTE — Progress Notes (Signed)
[facility]  ROUNDING NOTE   Subjective:   Husband at bedside.   Hemodialysis treatment yesterday. Tolerated treatment well. UF of 7.3 liters yesterday.   Objective:  Vital signs in last 24 hours:  Temp:  [97.8 F (36.6 C)-98.8 F (37.1 C)] 98.8 F (37.1 C) (02/27 1707) Pulse Rate:  [96-128] 111 (02/27 1707) Resp:  [17-18] 18 (02/27 1707) BP: (115-150)/(65-95) 140/82 (02/27 1707) SpO2:  [92 %-100 %] 92 % (02/27 1707) Weight:  [127.6 kg (281 lb 6.4 oz)] 127.6 kg (281 lb 6.4 oz) (02/26 1828)  Weight change:  Filed Weights   04/22/17 1100 04/22/17 1828  Weight: 129.5 kg (285 lb 6.4 oz) 127.6 kg (281 lb 6.4 oz)    Intake/Output: I/O last 3 completed shifts: In: -  Out: 7396 [Other:7396]   Intake/Output this shift:  No intake/output data recorded.  Physical Exam: General: NAD, sitting in chair  Head: Normocephalic, atraumatic. Moist oral mucosal membranes  Eyes: Anicteric, PERRL  Neck: Supple, trachea midline  Lungs:  Clear to auscultation  Heart: Regular rate and rhythm  Abdomen:  Soft, nontender, obese  Extremities:  + peripheral edema.  Neurologic: Nonfocal, moving all four extremities  Skin: No lesions  Access: Left IJ permcath    Basic Metabolic Panel: Recent Labs  Lab 04/18/17 0432 04/21/17 0601  NA 137 130*  K 4.7 4.4  CL 95* 90*  CO2 32 30  GLUCOSE 199* 188*  BUN 72* 56*  CREATININE 4.30* 3.93*  CALCIUM 8.5* 8.5*    Liver Function Tests: No results for input(s): AST, ALT, ALKPHOS, BILITOT, PROT, ALBUMIN in the last 168 hours. No results for input(s): LIPASE, AMYLASE in the last 168 hours. No results for input(s): AMMONIA in the last 168 hours.  CBC: Recent Labs  Lab 04/18/17 0432 04/21/17 0601  WBC 8.2 8.3  HGB 8.5* 8.0*  HCT 27.2* 26.0*  MCV 86.3 85.9  PLT 356 321    Cardiac Enzymes: No results for input(s): CKTOTAL, CKMB, CKMBINDEX, TROPONINI in the last 168 hours.  BNP: Invalid input(s): POCBNP  CBG: Recent  Labs  Lab 04/22/17 1836 04/22/17 2132 04/23/17 0804 04/23/17 1218 04/23/17 1707  GLUCAP 168* 173* 184* 229* 172*    Microbiology: Results for orders placed or performed during the hospital encounter of 03/24/17  MRSA PCR Screening     Status: None   Collection Time: 03/25/17  5:50 AM  Result Value Ref Range Status   MRSA by PCR NEGATIVE NEGATIVE Final    Comment:        The GeneXpert MRSA Assay (FDA approved for NASAL specimens only), is one component of a comprehensive MRSA colonization surveillance program. It is not intended to diagnose MRSA infection nor to guide or monitor treatment for MRSA infections. Performed at [facility], [address] [place], [place] [address]   MRSA PCR Screening     Status: None   Collection Time: 04/18/17 12:37 PM  Result Value Ref Range Status   MRSA by PCR NEGATIVE NEGATIVE Final    Comment:        The GeneXpert MRSA Assay (FDA approved for NASAL specimens only), is one component of a comprehensive MRSA colonization surveillance program. It is not intended to diagnose MRSA infection nor to guide or monitor treatment for MRSA infections. Performed at [facility], [address] [place], [place] [address]     Coagulation Studies: No results for input(s): LABPROT, INR in the last 72 hours.  Urinalysis: No results for input(s):  COLORURINE, LABSPEC, [place], GLUCOSEU, HGBUR, BILIRUBINUR, KETONESUR, PROTEINUR, UROBILINOGEN, NITRITE, LEUKOCYTESUR in the last 72 hours.  Invalid input(s): APPERANCEUR    Imaging: No results found.   Medications:   . sodium chloride    . iron sucrose Stopped (04/22/17 1700)   . aspirin EC  81 mg Oral Daily  . atorvastatin  80 mg Oral q1800  . epoetin (EPOGEN/PROCRIT) injection  4,000 Units Intravenous Q T,Th,Sa-HD  . feeding supplement (NEPRO CARB STEADY)  237 mL Oral TID WC  . heparin  5,000 Units Subcutaneous Q8H  . hydrocerin   Topical BID  . insulin aspart   0-15 Units Subcutaneous TID WC  . insulin aspart  0-5 Units Subcutaneous QHS  . insulin aspart  3 Units Subcutaneous TID WC  . insulin glargine  12 Units Subcutaneous QHS  . metoprolol tartrate  50 mg Oral BID  . multivitamin  1 tablet Oral QHS  . multivitamin with minerals  1 tablet Oral Daily  . polyethylene glycol  17 g Oral Daily  . sodium chloride  2 spray Each Nare QID  . sodium chloride flush  3 mL Intravenous Q12H  . torsemide  100 mg Oral Daily   sodium chloride, acetaminophen **OR** acetaminophen, ALPRAZolam, cyclobenzaprine, diphenhydrAMINE, ipratropium-albuterol, lactulose, ondansetron **OR** ondansetron (ZOFRAN) IV, oxyCODONE, sodium chloride flush  Assessment/ Plan:  Ms. ANNE BOLTZ is a 47 y.o. black female with end stage renal disease new start, diastolic congestive heart failure, hypertension, diabetes mellitus type II, anemia,   1. End-stage renal disease: 24 hr urine Creatinine clearance is 16 cc/min (04/04/2017). Seen and examined on hemodialysis. Tolerating treatment well. Over 30 kg removed with dialysis since initiated. - Next dialysis for Thursday. Start TTS schedule.  - Consult vascular for possible AVF placement before discharge. Vein mapping done.  - Outpatient planning for [facility].    2. Hypertension and Acute on chronic diastolic congestive heart failure (grade 1 diastolic dysfunction)  - Continue to perform perform hemodialysis with ultrafiltration.   - torsemide 100mg  daily PO  3. Anemia of chronic kidney disease: With iron deficiency - EPO with HD treatments TTS - IV Iron TTS  4. Secondary Hyperparathyroidism: Calcium and phosphorus at goal. PTH low at 134.    LOS: [place] 2/27/20196:20 PM

## 2017-04-23 NOTE — Progress Notes (Signed)
Assessed for IV restart, need prevue, no visible vein. RN will update IV consult when back in bed.

## 2017-04-23 NOTE — Care Management (Addendum)
Discussed having physical therapy treat daily if at all possible so patient will not lose momentum that she is gaining with ambulation attempts.  Patient does admit her legs still feel "very very"  weak. Reaching out to [facility] regarding option of using  ems for dialysis appointments in the event patient discharges home.  Given update to [facility] how has agreed to follow for home health

## 2017-04-23 NOTE — Progress Notes (Signed)
Patient ID: [name], female   DOB: 06/22/1970, 47 y.o.   MRN: [id number]  [employer] PROGRESS NOTE  [name]:[id number] DOB: 02-May-1970 DOA: 03/24/2017 PCP: [name], NP  HPI/Subjective: Patient with no new complaints, vascular surgery is planning to place AV fistula on Friday Objective: Vitals:   04/23/17 1112 04/23/17 1707  BP:  140/82  Pulse:  (!) 111  Resp:  18  Temp:  98.8 F (37.1 C)  SpO2: 98% 92%    Filed Weights   04/22/17 1100 04/22/17 1828  Weight: 129.5 kg (285 lb 6.4 oz) 127.6 kg (281 lb 6.4 oz)    ROS: Review of Systems  Constitutional: Negative for chills and fever.  Eyes: Negative for blurred vision.  Respiratory: Positive for shortness of breath. Negative for cough.   Cardiovascular: Negative for chest pain.  Gastrointestinal: Negative for abdominal pain, constipation, diarrhea, nausea and vomiting.  Genitourinary: Negative for dysuria.  Musculoskeletal: Negative for joint pain.  Neurological: Negative for dizziness and headaches.   Exam: Physical Exam  Constitutional: She is oriented to person, place, and time.  HENT:  Nose: No mucosal edema.  Mouth/Throat: No oropharyngeal exudate or posterior oropharyngeal edema.  Eyes: Conjunctivae, EOM and lids are normal. Pupils are equal, round, and reactive to light.  Neck: No JVD present. Carotid bruit is not present. No edema present. No thyroid mass and no thyromegaly present.  Cardiovascular: S1 normal and S2 normal. Exam reveals no gallop.  No murmur heard. Pulses:      Dorsalis pedis pulses are 2+ on the right side, and 2+ on the left side.  Respiratory: No respiratory distress. She has decreased breath sounds in the right lower field and the left lower field. She has no wheezes. She has no rhonchi. She has no rales.  GI: Soft. Bowel sounds are normal. She exhibits distension. There is no tenderness.  Musculoskeletal:       Right ankle: She exhibits swelling.       Left  ankle: She exhibits swelling.  Lymphadenopathy:    She has no cervical adenopathy.  Neurological: She is alert and oriented to person, place, and time. No cranial nerve deficit.  Skin: Skin is warm. Nails show no clubbing.  Slight bleeding at the tunneled catheter site dressing.  Chronic lower extremity discoloration  Psychiatric: She has a normal mood and affect.      Data Reviewed: Basic Metabolic Panel: Recent Labs  Lab 04/18/17 0432 04/21/17 0601  NA 137 130*  K 4.7 4.4  CL 95* 90*  CO2 32 30  GLUCOSE 199* 188*  BUN 72* 56*  CREATININE 4.30* 3.93*  CALCIUM 8.5* 8.5*   CBC: Recent Labs  Lab 04/18/17 0432 04/21/17 0601  WBC 8.2 8.3  HGB 8.5* 8.0*  HCT 27.2* 26.0*  MCV 86.3 85.9  PLT 356 321   BNP (last 3 results) Recent Labs    01/07/17 1039 03/03/17 1114 03/27/17 0702  BNP 456.0* 617.0* 577.0*     CBG: Recent Labs  Lab 04/22/17 1836 04/22/17 2132 04/23/17 0804 04/23/17 1218 04/23/17 1707  GLUCAP 168* 173* 184* 229* 172*     Scheduled Meds: . aspirin EC  81 mg Oral Daily  . atorvastatin  80 mg Oral q1800  . epoetin (EPOGEN/PROCRIT) injection  4,000 Units Intravenous Q T,Th,Sa-HD  . feeding supplement (NEPRO CARB STEADY)  237 mL Oral TID WC  . heparin  5,000 Units Subcutaneous Q8H  . hydrocerin   Topical BID  . insulin  aspart  0-15 Units Subcutaneous TID WC  . insulin aspart  0-5 Units Subcutaneous QHS  . insulin aspart  3 Units Subcutaneous TID WC  . insulin glargine  12 Units Subcutaneous QHS  . metoprolol tartrate  50 mg Oral BID  . multivitamin  1 tablet Oral QHS  . multivitamin with minerals  1 tablet Oral Daily  . polyethylene glycol  17 g Oral Daily  . sodium chloride  2 spray Each Nare QID  . sodium chloride flush  3 mL Intravenous Q12H  . torsemide  100 mg Oral Daily   Continuous Infusions: . sodium chloride    . iron sucrose Stopped (04/22/17 1700)    Assessment/Plan:  1. Acute on chronic hypoxic hypercarbic respiratory  failure secondary to acute CHF exacerbation.  Patient currently on 4 L of oxygen, wean off as tolerated 2. Acute on chronic diastolic congestive heart failure.  Dialysis to help out with fluid management.  Continue metoprolol. 3. Acute kidney injury on end-stage renal disease with hyponatremia.  Continue hemodialysis on Tuesday, Thursday and Saturday Scheduled for AV fistula probably on Friday by vascular surgery.  Venous mapping already done  patient needs a dialysis outpatient slot.   Tunneled dialysis catheter has been placed Patient was able to sit in a  bariatric dialysis chair  but she will need to be ready for transfer from her wheelchair to the outpatient dialysis chair.   4. Morbid obesity.  Likely has sleep apnea underlying also.  Recommend outpatient sleep study. 5. Constipation on MiraLAX 6. Essential hypertension on metoprolol 7. Hyperlipidemia unspecified on atorvastatin 8.  anxiety.  Patient states that she does not want the Cymbalta.   Xanax as needed 9. Type 2 diabetes mellitus on Lantus and sliding scale 10. Weakness.  Patient prefers working with physical therapy prior to her dialysis Discussed with RN and physical therapist to work with her before her dialysis 50.  Gradual decline in vision probably from diabetic retinopathy and patient failed outpatient ophthalmology follow-up , consulted ophthalmology and discussed with Dr. Edison Pace he is aware of the consult, recommending outpatient follow-up as  he cannot bring the necessary equipment to the hospital  Plan of care discussed in detail with the patient and her husband at bedside. Code Status:     Code Status Orders  (From admission, onward)        Start     Ordered   03/24/17 1951  Full code  Continuous     03/24/17 1952    Code Status History    Date Active Date Inactive Code Status Order ID Comments User Context   03/03/2017 14:20 03/10/2017 21:31 Full Code [id number]  Fritzi Mandes, MD ED   01/07/2017 17:25 01/10/2017  19:00 Full Code [id number]  Demetrios Loll, MD Inpatient   11/08/2016 04:50 11/16/2016 17:17 Full Code [id number]  Harvie Bridge, DO Inpatient   06/25/2016 14:47 06/29/2016 18:15 Full Code [id number]  Hillary Bow, MD ED   08/09/2015 17:57 08/13/2015 17:21 Full Code [id number]  Bettey Costa, MD Inpatient   12/31/2014 20:25 01/03/2015 17:49 Full Code [id number]  Lytle Butte, MD ED   12/15/2014 11:54 12/19/2014 16:57 Full Code [id number]  Hillary Bow, MD ED      Disposition Plan:  if patient is able to sit up with dialysis and tolerated well then hopefully she can go out to rehab once an outpatient dialysis slot is obtained.  Consultants:  Nephrology  Vascular surgery  Time spent: 26 minutes.  Nicholes Mango  [employer]

## 2017-04-23 NOTE — Progress Notes (Signed)
Attempted to apply mask, pt refused. Informed pt that is she changes her mind and would like to try again to please call.

## 2017-04-23 NOTE — Progress Notes (Signed)
Pt wore BiPAP mask for 10 min, agreed to try to wear tonight after snack. Rt remained in room for duration

## 2017-04-24 ENCOUNTER — Ambulatory Visit: Payer: Self-pay

## 2017-04-24 ENCOUNTER — Encounter: Payer: Self-pay | Admitting: Anesthesiology

## 2017-04-24 LAB — CBC
HEMATOCRIT: 26.5 % — AB (ref 35.0–47.0)
Hemoglobin: 8 g/dL — ABNORMAL LOW (ref 12.0–16.0)
MCH: 26 pg (ref 26.0–34.0)
MCHC: 30.1 g/dL — ABNORMAL LOW (ref 32.0–36.0)
MCV: 86.3 fL (ref 80.0–100.0)
PLATELETS: 338 10*3/uL (ref 150–440)
RBC: 3.07 MIL/uL — AB (ref 3.80–5.20)
RDW: 16.8 % — ABNORMAL HIGH (ref 11.5–14.5)
WBC: 12.4 10*3/uL — ABNORMAL HIGH (ref 3.6–11.0)

## 2017-04-24 LAB — GLUCOSE, CAPILLARY
GLUCOSE-CAPILLARY: 167 mg/dL — AB (ref 65–99)
GLUCOSE-CAPILLARY: 126 mg/dL — AB (ref 65–99)
Glucose-Capillary: 160 mg/dL — ABNORMAL HIGH (ref 65–99)

## 2017-04-24 LAB — PHOSPHORUS: Phosphorus: 2.4 mg/dL — ABNORMAL LOW (ref 2.5–4.6)

## 2017-04-24 MED ORDER — DIPHENHYDRAMINE HCL 25 MG PO CAPS
50.0000 mg | ORAL_CAPSULE | Freq: Four times a day (QID) | ORAL | Status: DC | PRN
  Administered 2017-04-24 (×2): 50 mg via ORAL
  Filled 2017-04-24 (×2): qty 2

## 2017-04-24 MED ORDER — SODIUM CHLORIDE 0.9 % IV SOLN
1.5000 g | INTRAVENOUS | Status: DC
  Filled 2017-04-24: qty 1.5

## 2017-04-24 MED ORDER — METOPROLOL TARTRATE 50 MG PO TABS
50.0000 mg | ORAL_TABLET | Freq: Three times a day (TID) | ORAL | Status: DC
  Administered 2017-04-24 – 2017-04-28 (×6): 50 mg via ORAL
  Filled 2017-04-24 (×8): qty 1

## 2017-04-24 MED ORDER — SODIUM CHLORIDE 0.9 % IV SOLN: INTRAVENOUS | Status: DC

## 2017-04-24 MED ORDER — DEXTROSE 5 % IV SOLN
3.0000 g | Freq: Once | INTRAVENOUS | Status: AC
  Administered 2017-04-25: 3 g via INTRAVENOUS
  Filled 2017-04-24: qty 3000
  Filled 2017-04-24: qty 3

## 2017-04-24 MED ORDER — SEVELAMER CARBONATE 800 MG PO TABS
800.0000 mg | ORAL_TABLET | Freq: Three times a day (TID) | ORAL | Status: DC
  Administered 2017-04-24 – 2017-04-27 (×7): 800 mg via ORAL
  Filled 2017-04-24 (×11): qty 1

## 2017-04-24 NOTE — Progress Notes (Signed)
PT Cancellation Note  Patient Details Name: [name] MRN: [id number] DOB: 24-Aug-1970   Cancelled Treatment:    Reason Eval/Treat Not Completed: Patient at procedure or test/unavailable   Pt off of unit for dialysis.  Will try to reschedule in PM if able.   Chesley Noon 04/24/2017, 11:41 AM

## 2017-04-24 NOTE — Progress Notes (Signed)
Report called to [place], Remo Lipps.

## 2017-04-24 NOTE — Progress Notes (Signed)
Nutrition Follow up Note   DOCUMENTATION CODES:   Morbid obesity  INTERVENTION:   Bowel regimen as needed per MD  Nepro Shake po TID, each supplement provides 425 kcal and 19 grams protein  Rena-vite daily   D/C MVI  NUTRITION DIAGNOSIS:   Increased nutrient needs related to chronic illness(CHF, morbid obesity) as evidenced by increased estimated needs from protein.  -new HD  GOAL:   Patient will meet greater than or equal to 90% of their needs  -progressing  MONITOR:   PO intake, Supplement acceptance, Weight trends, TF tolerance, Skin, I & O's, Labs  ASSESSMENT:   47 y.o. female with a known history of systolic CHF, CKD IV, hypertension, diabetes, morbid obesity who was recently discharged from the hospital returns from CHF clinic due to weight gain, worsening shortness of breath.   Pt s/p LIJ cath placement and HD initiation 2/8  Pt continues to do well; eating 100% of meals and drinking some Nepro. Pt's weight down from admit; refusing to stand on scales. On rena-vite. Will discontinue MVI daily as pt eating well now.   Medications reviewed and include: aspirin, epogen, heparin, insulin, MVI, rena-vite, miralax, toresemide  Labs reviewed  Diet renal/carb modified with fluid restriction Diet-HS Snack? Nothing; Fluid restriction: 1200 mL Fluid; Room service appropriate? Yes; Fluid consistency: Thin  EDUCATION NEEDS:   Education needs have been addressed  Skin:  Reviewed RN Assessment  Last BM:  2/25- type 3  Height:   Ht Readings from Last 1 Encounters:  04/22/17 5\' 4"  (1.626 m)    Weight:   Wt Readings from Last 1 Encounters:  04/24/17 285 lb 6.4 oz (129.5 kg)    Ideal Body Weight:  54.5 kg  BMI:  Body mass index is 48.99 kg/m.  Estimated Nutritional Needs:   Kcal:  2400-2700kcal/day   Protein:  >150g/day   Fluid:  per MD  Koleen Distance MS, RD, LDN Pager #[phone] After Hours Pager: [phone]

## 2017-04-24 NOTE — Progress Notes (Signed)
[facility]  ROUNDING NOTE   Subjective:   Seen and examined on hemodialysis. Tolerating treatment well. UF goal of 1.8 liters    HEMODIALYSIS FLOWSHEET:  Blood Flow Rate (mL/min): 400 mL/min Arterial Pressure (mmHg): -230 mmHg Venous Pressure (mmHg): 60 mmHg Transmembrane Pressure (mmHg): 20 mmHg Ultrafiltration Rate (mL/min): 0.91 mL/min Dialysate Flow Rate (mL/min): 600 ml/min Conductivity: Machine : 14 Conductivity: Machine : 14 Dialysis Fluid Bolus: Normal Saline Bolus Amount (mL): 250 mL    Objective:  Vital signs in last 24 hours:  Temp:  [98 F (36.7 C)-99.3 F (37.4 C)] 99.3 F (37.4 C) (02/28 1027) Pulse Rate:  [102-115] 107 (02/28 1330) Resp:  [16-32] 30 (02/28 1330) BP: (103-167)/(79-104) 151/104 (02/28 1330) SpO2:  [92 %-100 %] 97 % (02/28 1330) Weight:  [129 kg (284 lb 4.8 oz)-129.5 kg (285 lb 6.4 oz)] 129.5 kg (285 lb 6.4 oz) (02/28 0417)  Weight change: -0.499 kg (-1.6 oz) Filed Weights   04/23/17 1934 04/24/17 0417  Weight: 129 kg (284 lb 4.8 oz) 129.5 kg (285 lb 6.4 oz)    Intake/Output: No intake/output data recorded.   Intake/Output this shift:  Total I/O In: 240 [P.O.:240] Out: -   Physical Exam: General: NAD, sitting in chair  Head: Normocephalic, atraumatic. Moist oral mucosal membranes  Eyes: Anicteric, PERRL  Neck: Supple, trachea midline  Lungs:  Clear to auscultation  Heart: Regular rate and rhythm  Abdomen:  Soft, nontender, obese  Extremities:  + peripheral edema.  Neurologic: Nonfocal, moving all four extremities  Skin: No lesions  Access: Left IJ permcath    Basic Metabolic Panel: Recent Labs  Lab 04/18/17 0432 04/21/17 0601  NA 137 130*  K 4.7 4.4  CL 95* 90*  CO2 32 30  GLUCOSE 199* 188*  BUN 72* 56*  CREATININE 4.30* 3.93*  CALCIUM 8.5* 8.5*    Liver Function Tests: No results for input(s): AST, ALT, ALKPHOS, BILITOT, PROT, ALBUMIN in the last 168 hours. No results for input(s): LIPASE,  AMYLASE in the last 168 hours. No results for input(s): AMMONIA in the last 168 hours.  CBC: Recent Labs  Lab 04/18/17 0432 04/21/17 0601 04/24/17 1215  WBC 8.2 8.3 12.4*  HGB 8.5* 8.0* 8.0*  HCT 27.2* 26.0* 26.5*  MCV 86.3 85.9 86.3  PLT 356 321 338    Cardiac Enzymes: No results for input(s): CKTOTAL, CKMB, CKMBINDEX, TROPONINI in the last 168 hours.  BNP: Invalid input(s): POCBNP  CBG: Recent Labs  Lab 04/23/17 0804 04/23/17 1218 04/23/17 1707 04/23/17 2135 04/24/17 0814  GLUCAP 184* 229* 172* 119* 160*    Microbiology: Results for orders placed or performed during the hospital encounter of 03/24/17  MRSA PCR Screening     Status: None   Collection Time: 03/25/17  5:50 AM  Result Value Ref Range Status   MRSA by PCR NEGATIVE NEGATIVE Final    Comment:        The GeneXpert MRSA Assay (FDA approved for NASAL specimens only), is one component of a comprehensive MRSA colonization surveillance program. It is not intended to diagnose MRSA infection nor to guide or monitor treatment for MRSA infections. Performed at [facility], [address] [place], [place] [address]   MRSA PCR Screening     Status: None   Collection Time: 04/18/17 12:37 PM  Result Value Ref Range Status   MRSA by PCR NEGATIVE NEGATIVE Final    Comment:        The GeneXpert MRSA Assay (FDA approved  for NASAL specimens only), is one component of a comprehensive MRSA colonization surveillance program. It is not intended to diagnose MRSA infection nor to guide or monitor treatment for MRSA infections. Performed at [facility], [address] [place], [place] [address]     Coagulation Studies: No results for input(s): LABPROT, INR in the last 72 hours.  Urinalysis: No results for input(s): COLORURINE, LABSPEC, PHURINE, GLUCOSEU, HGBUR, BILIRUBINUR, KETONESUR, PROTEINUR, UROBILINOGEN, NITRITE, LEUKOCYTESUR in the last 72 hours.  Invalid input(s): APPERANCEUR     Imaging: [place] Ue Vein Mapping Left  Result Date: 04/24/2017 CLINICAL DATA:  47 year old female with renal failure EXAM: DUPLEX ULTRASOUND EXAM OF THE LEFT UPPER EXTREMITY VENOUS AND ARTERIAL SYSTEM COMPARISON:  None. FINDINGS: Directed duplex of the left upper extremity arterial system and venous system. Worksheet generated and stored. Duplex demonstrates triphasic waveform of left brachial artery, radial artery, and left ulnar artery. Directed duplex demonstrates patent brachial vein, cephalic vein, and basilic vein, with measurements as recorded on the worksheet. IMPRESSION: Directed duplex of the left upper extremity arterial system demonstrates triphasic waveforms. Directed duplex of the venous system of the left upper extremity demonstrates patent brachial vein, cephalic vein, basilic vein, with measurements as recorded on the worksheet. Electronically Signed   By: Corrie Mckusick D.O.   On: 04/24/2017 14:13     Medications:   . sodium chloride    . iron sucrose 100 mg (04/24/17 1334)   . aspirin EC  81 mg Oral Daily  . atorvastatin  80 mg Oral q1800  . epoetin (EPOGEN/PROCRIT) injection  4,000 Units Intravenous Q T,Th,Sa-HD  . feeding supplement (NEPRO CARB STEADY)  237 mL Oral TID WC  . heparin  5,000 Units Subcutaneous Q8H  . hydrocerin   Topical BID  . insulin aspart  0-15 Units Subcutaneous TID WC  . insulin aspart  0-5 Units Subcutaneous QHS  . insulin aspart  3 Units Subcutaneous TID WC  . insulin glargine  12 Units Subcutaneous QHS  . metoprolol tartrate  50 mg Oral BID  . multivitamin  1 tablet Oral QHS  . polyethylene glycol  17 g Oral Daily  . sevelamer carbonate  800 mg Oral TID WC  . sodium chloride  2 spray Each Nare QID  . sodium chloride flush  3 mL Intravenous Q12H  . torsemide  100 mg Oral Daily   sodium chloride, acetaminophen **OR** acetaminophen, ALPRAZolam, cyclobenzaprine, diphenhydrAMINE, ipratropium-albuterol, lactulose, ondansetron **OR** ondansetron  (ZOFRAN) IV, oxyCODONE, sodium chloride flush  Assessment/ Plan:  [name] is a 47 y.o. black female with end stage renal disease new start, diastolic congestive heart failure, hypertension, diabetes mellitus type II, anemia,   1. End-stage renal disease: 24 hr urine Creatinine clearance is 16 cc/min (04/04/2017). Seen and examined on hemodialysis. Tolerating treatment well. Over 32 kg removed with dialysis since initiated. - TTS schedule.  - Consult vascular for possible AVF placement before discharge.  - Outpatient planning for [facility].    2. Hypertension and Acute on chronic diastolic congestive heart failure (grade 1 diastolic dysfunction)  - Continue to perform perform hemodialysis with ultrafiltration.   - torsemide 100mg  daily PO  3. Anemia of chronic kidney disease: With iron deficiency - EPO with HD treatments TTS - IV Iron TTS  4. Secondary Hyperparathyroidism: Calcium and phosphorus at goal. PTH low at 134.     LOS: [place] 2/28/20192:23 PM

## 2017-04-24 NOTE — Progress Notes (Signed)
[facility]  Daily Progress Note   Subjective  - 8 Days Post-Op  Patient feeling OK.  Breathing better after many dialysis treatments.  Still making urine.   Objective Vitals:   04/24/17 1315 04/24/17 1330 04/24/17 1345 04/24/17 1400  BP: (!) 161/98 (!) 151/104 (!) 169/103 (!) 165/101  Pulse: (!) 109 (!) 107  (!) 105  Resp: (!) 28 (!) 30 18 (!) 31  Temp:    99 F (37.2 C)  TempSrc:      SpO2: 99% 97%  100%  Weight:      Height:        Intake/Output Summary (Last 24 hours) at 04/24/2017 1601 Last data filed at 04/24/2017 1400 Gross per 24 hour  Intake 240 ml  Output 1240 ml  Net -1000 ml    PULM  CTAB CV  RRR VASC  permcath in place with no erythema  Laboratory CBC    Component Value Date/Time   WBC 12.4 (H) 04/24/2017 1215   HGB 8.0 (L) 04/24/2017 1215   HGB 12.4 01/11/2014 1324   HCT 26.5 (L) 04/24/2017 1215   HCT 39.2 01/11/2014 1324   PLT 338 04/24/2017 1215   PLT 370 01/11/2014 1324    BMET    Component Value Date/Time   NA 130 (L) 04/21/2017 0601   NA 135 (L) 11/21/2013 1153   K 4.4 04/21/2017 0601   K 4.1 11/21/2013 1153   CL 90 (L) 04/21/2017 0601   CL 98 11/21/2013 1153   CO2 30 04/21/2017 0601   CO2 30 11/21/2013 1153   GLUCOSE 188 (H) 04/21/2017 0601   GLUCOSE 287 (H) 11/21/2013 1153   BUN 56 (H) 04/21/2017 0601   BUN 16 11/21/2013 1153   CREATININE 3.93 (H) 04/21/2017 0601   CREATININE 0.85 11/21/2013 1153   CALCIUM 8.5 (L) 04/21/2017 0601   CALCIUM 8.7 11/21/2013 1153   GFRNONAA 13 (L) 04/21/2017 0601   GFRNONAA >60 11/21/2013 1153   GFRAA 15 (L) 04/21/2017 0601   GFRAA >60 11/21/2013 1153    Assessment/Planning: POD #8 s/p left IJ permcath   ESRD. Needs permanent access. For AV graft tomorrow.  Vein mapping would suggest no usable superficial vein, but if one is seen at the time of surgery AV fistula would be done.    Risks and benefits discussed  Anesthesia to see to discuss block versus GA.  Likely  can not do MAC with tunneling for AV graft      [name]  04/24/2017, 4:01 PM

## 2017-04-24 NOTE — Progress Notes (Signed)
   04/24/17 1020  Neurological  Level of Consciousness Alert  Orientation Level Oriented X4  Respiratory  Respiratory Pattern Regular  Chest Assessment Chest expansion symmetrical  Cardiac  Pulse Regular  ECG Monitor Yes  Integumentary  Integumentary (WDL) X  Skin Color Appropriate for ethnicity  Skin Condition Dry;Flaky  Musculoskeletal  Musculoskeletal (WDL) X  Generalized Weakness Yes  Gastrointestinal  Bowel Sounds Assessment Active  GU Assessment  Genitourinary (WDL) X  Genitourinary Symptoms Oliguria  Psychosocial  Psychosocial (WDL) WDL     04/24/17 1020  Neurological  Level of Consciousness Alert  Orientation Level Oriented X4  Respiratory  Respiratory Pattern Regular  Chest Assessment Chest expansion symmetrical  Cardiac  Pulse Regular  ECG Monitor Yes  Integumentary  Integumentary (WDL) X  Skin Color Appropriate for ethnicity  Skin Condition Dry;Flaky  Musculoskeletal  Musculoskeletal (WDL) X  Generalized Weakness Yes  Gastrointestinal  Bowel Sounds Assessment Active  GU Assessment  Genitourinary (WDL) X  Genitourinary Symptoms Oliguria  Psychosocial  Psychosocial (WDL) WDL   Pre HD assessment

## 2017-04-24 NOTE — Progress Notes (Signed)
Pt declines to stand for daily weight.  Reports will do later today.

## 2017-04-24 NOTE — Progress Notes (Addendum)
   04/24/17 1020  Neurological  Level of Consciousness Alert  Orientation Level Oriented X4  Respiratory  Respiratory Pattern Regular  Chest Assessment Chest expansion symmetrical  Cardiac  Pulse Regular  ECG Monitor Yes  Integumentary  Integumentary (WDL) X  Skin Color Appropriate for ethnicity  Skin Condition Dry;Flaky  Musculoskeletal  Musculoskeletal (WDL) X  Generalized Weakness Yes  Gastrointestinal  Bowel Sounds Assessment Active  GU Assessment  Genitourinary (WDL) X  Genitourinary Symptoms Oliguria  Psychosocial  Psychosocial (WDL) WDL   Pre dialysis assessment complete

## 2017-04-24 NOTE — Progress Notes (Signed)
Patient ID: [name], female   DOB: 09/01/1970, 47 y.o.   MRN: [id number]  [facility] PROGRESS NOTE  [name] VXB:[id number] DOB: 30-Sep-1970 DOA: 03/24/2017 PCP: [name], NP  HPI/Subjective: Patient feeling okay.  Seen this morning sitting up in bed.  Feels a little bit stronger.  Objective: Vitals:   04/24/17 1345 04/24/17 1400  BP: (!) 169/103 (!) 165/101  Pulse:  (!) 105  Resp: 18 (!) 31  Temp:  99 F (37.2 C)  SpO2:  100%    Filed Weights   04/23/17 1934 04/24/17 0417  Weight: 129 kg (284 lb 4.8 oz) 129.5 kg (285 lb 6.4 oz)    ROS: Review of Systems  Constitutional: Negative for chills and fever.  Eyes: Negative for blurred vision.  Respiratory: Positive for shortness of breath. Negative for cough.   Cardiovascular: Negative for chest pain.  Gastrointestinal: Negative for abdominal pain, constipation, diarrhea, nausea and vomiting.  Genitourinary: Negative for dysuria.  Musculoskeletal: Negative for joint pain.  Neurological: Negative for dizziness and headaches.   Exam: Physical Exam  Constitutional: She is oriented to person, place, and time.  HENT:  Nose: No mucosal edema.  Mouth/Throat: No oropharyngeal exudate or posterior oropharyngeal edema.  Eyes: Conjunctivae, EOM and lids are normal. Pupils are equal, round, and reactive to light.  Neck: No JVD present. Carotid bruit is not present. No edema present. No thyroid mass and no thyromegaly present.  Cardiovascular: S1 normal and S2 normal. Tachycardia present. Exam reveals no gallop.  No murmur heard. Respiratory: No respiratory distress. She has decreased breath sounds in the right lower field and the left lower field. She has no wheezes. She has no rhonchi. She has no rales.  GI: Soft. Bowel sounds are normal. There is no tenderness.  Musculoskeletal:       Right ankle: She exhibits swelling.       Left ankle: She exhibits swelling.  Lymphadenopathy:    She has no cervical  adenopathy.  Neurological: She is alert and oriented to person, place, and time. No cranial nerve deficit.  Skin: Skin is warm. No rash noted. Nails show no clubbing.  Psychiatric: She has a normal mood and affect.      Data Reviewed: Basic Metabolic Panel: Recent Labs  Lab 04/18/17 0432 04/21/17 0601  NA 137 130*  K 4.7 4.4  CL 95* 90*  CO2 32 30  GLUCOSE 199* 188*  BUN 72* 56*  CREATININE 4.30* 3.93*  CALCIUM 8.5* 8.5*   CBC: Recent Labs  Lab 04/18/17 0432 04/21/17 0601 04/24/17 1215  WBC 8.2 8.3 12.4*  HGB 8.5* 8.0* 8.0*  HCT 27.2* 26.0* 26.5*  MCV 86.3 85.9 86.3  PLT 356 321 338   BNP (last 3 results) Recent Labs    01/07/17 1039 03/03/17 1114 03/27/17 0702  BNP 456.0* 617.0* 577.0*     CBG: Recent Labs  Lab 04/23/17 0804 04/23/17 1218 04/23/17 1707 04/23/17 2135 04/24/17 0814  GLUCAP 184* 229* 172* 119* 160*    Recent Results (from the past 240 hour(s))  MRSA PCR Screening     Status: None   Collection Time: 04/18/17 12:37 PM  Result Value Ref Range Status   MRSA by PCR NEGATIVE NEGATIVE Final    Comment:        The GeneXpert MRSA Assay (FDA approved for NASAL specimens only), is one component of a comprehensive MRSA colonization surveillance program. It is not intended to diagnose MRSA infection nor to guide or monitor  treatment for MRSA infections. Performed at [facility], [address], [place], [place] [address]      Studies: No results found.  Scheduled Meds: . aspirin EC  81 mg Oral Daily  . atorvastatin  80 mg Oral q1800  . epoetin (EPOGEN/PROCRIT) injection  4,000 Units Intravenous Q T,Th,Sa-HD  . feeding supplement (NEPRO CARB STEADY)  237 mL Oral TID WC  . heparin  5,000 Units Subcutaneous Q8H  . hydrocerin   Topical BID  . insulin aspart  0-15 Units Subcutaneous TID WC  . insulin aspart  0-5 Units Subcutaneous QHS  . insulin aspart  3 Units Subcutaneous TID WC  . insulin glargine  12 Units  Subcutaneous QHS  . metoprolol tartrate  50 mg Oral BID  . multivitamin  1 tablet Oral QHS  . polyethylene glycol  17 g Oral Daily  . sevelamer carbonate  800 mg Oral TID WC  . sodium chloride  2 spray Each Nare QID  . sodium chloride flush  3 mL Intravenous Q12H  . torsemide  100 mg Oral Daily   Continuous Infusions: . sodium chloride    . sodium chloride    . ceFAZolin    . iron sucrose Stopped (04/24/17 1457)    Assessment/Plan:  1. Acute on chronic hypoxic hypercarbic respiratory failure secondary to acute CHF.  Patient on 4 L of oxygen.  She has the BiPAP at bedside I encouraged her to wear it she did not wear it last night. 2. Acute on chronic diastolic congestive heart failure.  Dialysis to help out with fluid management.  Continue metoprolol. 3. Accelerated hypertension tachycardia increase metoprolol dose to 50 mg 3 times daily 4. End-stage renal disease. Nephrology following for dialysis.  Fistula to be placed tomorrow. 5. Anemia of chronic disease with iron deficiency also.  Neupogen and IV iron with hemodialysis 6. Morbid obesity and likely underlying sleep apnea 7. Hyperlipidemia unspecified on atorvastatin 8. Anxiety on Xanax 9. Type 2 diabetes mellitus on Lantus and sliding scale 10. Weakness.  Social worker looking into rehab with [place] [facility] 11. Gradual decline in vision will need ophthalmology as outpatient  Code Status:     Code Status Orders  (From admission, onward)        Start     Ordered   03/24/17 1951  Full code  Continuous     03/24/17 1952    Code Status History    Date Active Date Inactive Code Status Order ID Comments User Context   03/03/2017 14:20 03/10/2017 21:31 Full Code [id number]  Fritzi Mandes, MD ED   01/07/2017 17:25 01/10/2017 19:00 Full Code [id number]  Demetrios Loll, MD Inpatient   11/08/2016 04:50 11/16/2016 17:17 Full Code [id number]  Harvie Bridge, DO Inpatient   06/25/2016 14:47 06/29/2016 18:15 Full Code [id number]  Hillary Bow, MD ED   08/09/2015 17:57 08/13/2015 17:21 Full Code [id number]  Bettey Costa, MD Inpatient   12/31/2014 20:25 01/03/2015 17:49 Full Code [id number]  Lytle Butte, MD ED   12/15/2014 11:54 12/19/2014 16:57 Full Code [id number]  Hillary Bow, MD ED     Family Communication: Husband at the bedside Disposition Plan: Likely discharge over the weekend to [facility]  Consultants:  Nephrology  Vascular surgery  Time spent: 28 minutes  [place]

## 2017-04-24 NOTE — Care Management (Signed)
Patient has a bed offer from [facility] which feel she is inclined to accept.  [facility] staff will come to nursing unit today around 3:30p to meet and speak with patient and her husband. Had anticipated discharge 3/1- but have now been informed that patient is for fistula placement 3/1, so most likely will anticipate discharge Saturday 3/2 after dialysis.  If for some reason there is a schedule change in the fistula placement, most  likely it can be scheduled as an outpatient

## 2017-04-24 NOTE — Anesthesia Preprocedure Evaluation (Deleted)
Anesthesia Evaluation  Patient identified by MRN, date of birth, ID band Patient awake    Reviewed: Allergy & Precautions, NPO status , [name] Chart, lab work & pertinent test results  History of Anesthesia Complications Negative for: history of anesthetic complications  Airway Mallampati: III  TM Distance: >3 FB Neck ROM: Full    Dental  (+) Poor Dentition   Pulmonary shortness of breath and with exertion, asthma , sleep apnea (has refused BiPAP while in hospital) , COPD,  COPD inhaler and oxygen dependent,    breath sounds clear to auscultation- rhonchi (-) wheezing      Cardiovascular hypertension, Pt. on medications +CHF  (-) CAD, (-) Past MI, (-) Cardiac Stents and (-) CABG  Rhythm:Regular Rate:Normal - Systolic murmurs and - Diastolic murmurs Echo 2/53/66: - Left ventricle: The cavity size was mildly dilated. Systolic   function was normal. The estimated ejection fraction was 55%.   Wall motion was normal; there were no regional wall motion   abnormalities. Doppler parameters are consistent with abnormal   left ventricular relaxation (grade 1 diastolic dysfunction). - Mitral valve: There was mild regurgitation. - Atrial septum: No defect or patent foramen ovale was identified   Neuro/Psych PSYCHIATRIC DISORDERS Anxiety Depression negative neurological ROS     GI/Hepatic negative GI ROS, Neg liver ROS,   Endo/Other  diabetes, Insulin Dependent  Renal/GU ESRF and DialysisRenal disease (last dialysis 04/24/17)     Musculoskeletal negative musculoskeletal ROS (+)   Abdominal (+) + obese,   Peds  Hematology  (+) anemia ,   Anesthesia Other Findings Past Medical History: No date: (HFpEF) heart failure with preserved ejection fraction ([place])     Comment:  a. 11/2014 Echo: EF 50-55%, Gr1 DD; b. 06/2016 Echo: EF               60-65%, no rwma, mod dil LA, nl RV, triv eff. No date: Asthma No date: CHF (congestive  heart failure) (HCC) No date: CKD (chronic kidney disease), stage III (HCC) No date: COPD (chronic obstructive pulmonary disease) ([place])     Comment:  a. Home O2. No date: Diabetes mellitus without complication (HCC) No date: Hypertension No date: Iron deficiency anemia     Comment:  a. chronic blood loss->heavy menses. No date: Morbid obesity ([place]) No date: Pericardial effusion     Comment:  a. 11/2014 CT Chest: Mod effusion; b. 11/2014 Echo:               small to mod circumferential effusion. No hemodynamic               compromise. No date: Poorly controlled diabetes mellitus ([place])     Comment:  a. 11/2014 A1c 13.2.   Reproductive/Obstetrics                            Anesthesia Plan  ASA: IV  Anesthesia Plan: General   Post-op Pain Management:    Induction: Intravenous  PONV Risk Score and Plan: 2 and Ondansetron and Midazolam  Airway Management Planned: Oral ETT  Additional Equipment:   Intra-op Plan:   Post-operative Plan: Extubation in OR and Possible Post-op intubation/ventilation  Informed Consent: I have reviewed the patients History and Physical, chart, labs and discussed the procedure including the risks, benefits and alternatives for the proposed anesthesia with the patient or authorized representative who has indicated his/her understanding and acceptance.   Dental advisory given  Plan Discussed with: CRNA and  Anesthesiologist  Anesthesia Plan Comments: (Has had significant total fluid reduction via dialysis since procedure last week. )       Anesthesia Quick Evaluation

## 2017-04-24 NOTE — Progress Notes (Signed)
Patient compliant with standing for daily weight.

## 2017-04-25 ENCOUNTER — Encounter
Admission: EM | Disposition: A | Discharge status: Skilled Nursing Facility | Payer: Self-pay | Source: Home / Self Care | Attending: Internal Medicine

## 2017-04-25 ENCOUNTER — Inpatient Hospital Stay: Payer: Medicaid Other | Admitting: Anesthesiology

## 2017-04-25 DIAGNOSIS — N186 End stage renal disease: Secondary | ICD-10-CM

## 2017-04-25 HISTORY — PX: AV FISTULA PLACEMENT: SHX1204

## 2017-04-25 LAB — GLUCOSE, CAPILLARY
GLUCOSE-CAPILLARY: 193 mg/dL — AB (ref 65–99)
GLUCOSE-CAPILLARY: 173 mg/dL — AB (ref 65–99)
GLUCOSE-CAPILLARY: 282 mg/dL — AB (ref 65–99)
Glucose-Capillary: 123 mg/dL — ABNORMAL HIGH (ref 65–99)
Glucose-Capillary: 124 mg/dL — ABNORMAL HIGH (ref 65–99)
Glucose-Capillary: 153 mg/dL — ABNORMAL HIGH (ref 65–99)

## 2017-04-25 LAB — ABO/RH: ABO/RH(D): A POS

## 2017-04-25 LAB — TYPE AND SCREEN
ABO/RH(D): A POS
Antibody Screen: NEGATIVE

## 2017-04-25 SURGERY — ARTERIOVENOUS (AV) FISTULA CREATION
Anesthesia: General | Site: Arm Upper | Laterality: Left | Wound class: Clean Contaminated

## 2017-04-25 MED ORDER — LIDOCAINE HCL (CARDIAC) 20 MG/ML IV SOLN
INTRAVENOUS | Status: DC | PRN
  Administered 2017-04-25: 100 mg via INTRAVENOUS

## 2017-04-25 MED ORDER — MIDAZOLAM HCL 2 MG/2ML IJ SOLN
INTRAMUSCULAR | Status: AC
Start: 2017-04-25 — End: ?
  Filled 2017-04-25: qty 2

## 2017-04-25 MED ORDER — HEPARIN SODIUM (PORCINE) 1000 UNIT/ML IJ SOLN
INTRAMUSCULAR | Status: DC | PRN
  Administered 2017-04-25: 4000 [IU] via INTRAVENOUS

## 2017-04-25 MED ORDER — SODIUM CHLORIDE 0.9 % IJ SOLN
INTRAMUSCULAR | Status: AC
  Filled 2017-04-25: qty 10

## 2017-04-25 MED ORDER — HYDROCODONE-ACETAMINOPHEN 5-325 MG PO TABS
1.0000 | ORAL_TABLET | Freq: Four times a day (QID) | ORAL | Status: DC | PRN
  Administered 2017-04-25 – 2017-04-28 (×8): 2 via ORAL
  Filled 2017-04-25 (×8): qty 2

## 2017-04-25 MED ORDER — CEFAZOLIN SODIUM 1 G IJ SOLR
INTRAMUSCULAR | Status: AC
  Filled 2017-04-25: qty 20

## 2017-04-25 MED ORDER — ONDANSETRON HCL 4 MG/2ML IJ SOLN
INTRAMUSCULAR | Status: AC
  Filled 2017-04-25: qty 2

## 2017-04-25 MED ORDER — EVICEL 2 ML EX KIT
PACK | CUTANEOUS | Status: DC | PRN
  Administered 2017-04-25: 2 mL via TOPICAL

## 2017-04-25 MED ORDER — PROPOFOL 10 MG/ML IV BOLUS
INTRAVENOUS | Status: DC | PRN
  Administered 2017-04-25: 20 mg via INTRAVENOUS
  Administered 2017-04-25: 150 mg via INTRAVENOUS
  Administered 2017-04-25: 30 mg via INTRAVENOUS

## 2017-04-25 MED ORDER — HEPARIN SODIUM (PORCINE) 1000 UNIT/ML IJ SOLN
INTRAMUSCULAR | Status: AC
  Filled 2017-04-25: qty 1

## 2017-04-25 MED ORDER — CEFAZOLIN SODIUM-DEXTROSE 2-3 GM-%(50ML) IV SOLR
INTRAVENOUS | Status: DC | PRN
  Administered 2017-04-25: 2 g via INTRAVENOUS

## 2017-04-25 MED ORDER — PROPOFOL 10 MG/ML IV BOLUS
INTRAVENOUS | Status: AC
  Filled 2017-04-25: qty 20

## 2017-04-25 MED ORDER — PHENYLEPHRINE HCL 10 MG/ML IJ SOLN
INTRAMUSCULAR | Status: DC | PRN
  Administered 2017-04-25 (×4): 200 ug via INTRAVENOUS
  Administered 2017-04-25: 100 ug via INTRAVENOUS

## 2017-04-25 MED ORDER — ONDANSETRON HCL 4 MG/2ML IJ SOLN
INTRAMUSCULAR | Status: DC | PRN
  Administered 2017-04-25: 4 mg via INTRAVENOUS

## 2017-04-25 MED ORDER — SODIUM CHLORIDE 0.9 % IV SOLN
INTRAVENOUS | Status: DC | PRN
  Administered 2017-04-25: 40 ug/min via INTRAVENOUS

## 2017-04-25 MED ORDER — LIDOCAINE HCL (PF) 2 % IJ SOLN
INTRAMUSCULAR | Status: AC
  Filled 2017-04-25: qty 10

## 2017-04-25 MED ORDER — NALOXONE HCL 0.4 MG/ML IJ SOLN
INTRAMUSCULAR | Status: DC | PRN
  Administered 2017-04-25: 0.4 mg via INTRAVENOUS

## 2017-04-25 MED ORDER — FENTANYL CITRATE (PF) 100 MCG/2ML IJ SOLN
INTRAMUSCULAR | Status: AC
  Filled 2017-04-25: qty 2

## 2017-04-25 MED ORDER — DEXAMETHASONE SODIUM PHOSPHATE 10 MG/ML IJ SOLN
INTRAMUSCULAR | Status: DC | PRN
  Administered 2017-04-25: 5 mg via INTRAVENOUS

## 2017-04-25 MED ORDER — SUCCINYLCHOLINE CHLORIDE 20 MG/ML IJ SOLN
INTRAMUSCULAR | Status: DC | PRN
  Administered 2017-04-25: 80 mg via INTRAVENOUS

## 2017-04-25 MED ORDER — BUPIVACAINE-EPINEPHRINE (PF) 0.5% -1:200000 IJ SOLN
INTRAMUSCULAR | Status: AC
  Filled 2017-04-25: qty 30

## 2017-04-25 MED ORDER — PAPAVERINE HCL 30 MG/ML IJ SOLN
INTRAMUSCULAR | Status: AC
  Filled 2017-04-25: qty 2

## 2017-04-25 MED ORDER — HEPARIN SODIUM (PORCINE) 5000 UNIT/ML IJ SOLN
INTRAMUSCULAR | Status: AC
  Filled 2017-04-25: qty 1

## 2017-04-25 MED ORDER — FENTANYL CITRATE (PF) 100 MCG/2ML IJ SOLN
INTRAMUSCULAR | Status: DC | PRN
  Administered 2017-04-25: 25 ug via INTRAVENOUS
  Administered 2017-04-25: 50 ug via INTRAVENOUS
  Administered 2017-04-25: 25 ug via INTRAVENOUS

## 2017-04-25 MED ORDER — MIDAZOLAM HCL 2 MG/2ML IJ SOLN
INTRAMUSCULAR | Status: DC | PRN
  Administered 2017-04-25: 1 mg via INTRAVENOUS

## 2017-04-25 MED ORDER — DEXAMETHASONE SODIUM PHOSPHATE 10 MG/ML IJ SOLN
INTRAMUSCULAR | Status: AC
  Filled 2017-04-25: qty 1

## 2017-04-25 MED ORDER — ROCURONIUM BROMIDE 50 MG/5ML IV SOLN
INTRAVENOUS | Status: AC
  Filled 2017-04-25: qty 1

## 2017-04-25 SURGICAL SUPPLY — 56 items
ADH SKN CLS APL DERMABOND .7 (GAUZE/BANDAGES/DRESSINGS) ×1
BAG DECANTER FOR FLEXI CONT (MISCELLANEOUS) ×3 IMPLANT
BLADE SURG SZ11 CARB STEEL (BLADE) ×3 IMPLANT
BOOT SUTURE AID YELLOW STND (SUTURE) ×3 IMPLANT
BRUSH SCRUB EZ  4% CHG (MISCELLANEOUS) ×2
BRUSH SCRUB EZ 4% CHG (MISCELLANEOUS) ×1 IMPLANT
CANISTER SUCT 1200ML W/VALVE (MISCELLANEOUS) ×3 IMPLANT
CHLORAPREP W/TINT 26ML (MISCELLANEOUS) ×3 IMPLANT
CLIP SPRNG 6 S-JAW DBL (CLIP) ×1 IMPLANT
CLIP SPRNG 6MM S-JAW DBL (CLIP) ×3
DERMABOND ADVANCED (GAUZE/BANDAGES/DRESSINGS) ×2
DERMABOND ADVANCED .7 DNX12 (GAUZE/BANDAGES/DRESSINGS) ×1 IMPLANT
ELECT CAUTERY BLADE 6.4 (BLADE) ×3 IMPLANT
ELECT REM PT RETURN 9FT ADLT (ELECTROSURGICAL) ×3
ELECTRODE REM PT RTRN 9FT ADLT (ELECTROSURGICAL) ×1 IMPLANT
GEL ULTRASOUND 20GR AQUASONIC (MISCELLANEOUS) IMPLANT
GLOVE BIO SURGEON STRL SZ7 (GLOVE) ×6 IMPLANT
GLOVE INDICATOR 7.5 STRL GRN (GLOVE) ×3 IMPLANT
GOWN STRL REUS W/ TWL LRG LVL3 (GOWN DISPOSABLE) ×1 IMPLANT
GOWN STRL REUS W/ TWL XL LVL3 (GOWN DISPOSABLE) ×2 IMPLANT
GOWN STRL REUS W/TWL LRG LVL3 (GOWN DISPOSABLE) ×3
GOWN STRL REUS W/TWL XL LVL3 (GOWN DISPOSABLE) ×6
HEMOSTAT SURGICEL 2X3 (HEMOSTASIS) ×3 IMPLANT
IV NS 500ML (IV SOLUTION) ×3
IV NS 500ML BAXH (IV SOLUTION) ×1 IMPLANT
KIT TURNOVER KIT A (KITS) ×3 IMPLANT
LABEL OR SOLS (LABEL) ×3 IMPLANT
LOOP RED MAXI  1X406MM (MISCELLANEOUS) ×2
LOOP VESSEL MAXI 1X406 RED (MISCELLANEOUS) ×1 IMPLANT
LOOP VESSEL MINI 0.8X406 BLUE (MISCELLANEOUS) ×1 IMPLANT
LOOPS BLUE MINI 0.8X406MM (MISCELLANEOUS) ×2
NDL FILTER BLUNT 18X1 1/2 (NEEDLE) ×1 IMPLANT
NDL HYPO 30X.5 LL (NEEDLE) IMPLANT
NEEDLE FILTER BLUNT 18X 1/2SAF (NEEDLE) ×2
NEEDLE FILTER BLUNT 18X1 1/2 (NEEDLE) ×1 IMPLANT
NEEDLE HYPO 30X.5 LL (NEEDLE) IMPLANT
NS IRRIG 500ML POUR BTL (IV SOLUTION) ×3 IMPLANT
PACK EXTREMITY ARMC (MISCELLANEOUS) ×3 IMPLANT
PAD PREP 24X41 OB/GYN DISP (PERSONAL CARE ITEMS) ×3 IMPLANT
SOLUTION CELL SAVER (CLIP) ×1 IMPLANT
STOCKINETTE 48X4 2 PLY STRL (GAUZE/BANDAGES/DRESSINGS) ×1 IMPLANT
STOCKINETTE STRL 4IN 9604848 (GAUZE/BANDAGES/DRESSINGS) ×3 IMPLANT
SUT MNCRL AB 4-0 PS2 18 (SUTURE) ×3 IMPLANT
SUT PROLENE 6 0 BV (SUTURE) ×12 IMPLANT
SUT SILK 2 0 (SUTURE) ×3
SUT SILK 2-0 18XBRD TIE 12 (SUTURE) ×1 IMPLANT
SUT SILK 3 0 (SUTURE) ×3
SUT SILK 3-0 18XBRD TIE 12 (SUTURE) ×1 IMPLANT
SUT SILK 4 0 (SUTURE) ×3
SUT SILK 4-0 18XBRD TIE 12 (SUTURE) ×1 IMPLANT
SUT VIC AB 3-0 SH 27 (SUTURE) ×6
SUT VIC AB 3-0 SH 27X BRD (SUTURE) ×2 IMPLANT
SYR 20CC LL (SYRINGE) ×3 IMPLANT
SYR 3ML LL SCALE MARK (SYRINGE) ×3 IMPLANT
SYR TB 1ML 27GX1/2 LL (SYRINGE) IMPLANT
TOWEL OR 17X26 4PK STRL BLUE (TOWEL DISPOSABLE) ×2 IMPLANT

## 2017-04-25 NOTE — Progress Notes (Signed)
Pt on BI-PAP x 1 hour. Requested at 1726 to take BI-PAP off and trial with oxygen nasal cannula. Per Dr. Rosey Bath ok to trial nasal cannula. Pt placed on 4 liters nasal cannula x 30 mins and pt maintained 94% or above. Dr. Rosey Bath ok'd for pt to go back to her room. Janeliz Prestwood E 5:57 PM 04/25/2017

## 2017-04-25 NOTE — Care Management (Signed)
Received call from [facility] regarding facility attempting to verify medicaid and receiving alert that patient's medicaid is family planning.  Contacted the pre service center and found that the problem most likely is patient has had medicaid account under a different last name, but has been e verified that patient has full medicaid coverage.  Notified Claiborne Billings with [facility].

## 2017-04-25 NOTE — Progress Notes (Addendum)
LCSW following for disposition  Placement remains pending with [facility]. SNF remains to still try and confirm medicaid, thus even if accepted she will need an LOG which has been filled out and sent to [facility] director for approval/completion.  LCSW should find out today from [facility] regarding final word regarding acceptance as they are waiting on a regional administrator to sign off.  Will hopefully get authorization to admit this weekend today per facility. Will continue to follow.  3:05 PM Bed offer remains to be pending as facility is unable to verify her medicaid. Once facility has the ability to verify her medicaid (and that it is not family planning medicaid) then bed offer will most likely be extended.  Call placed to bot CM and CSW who worked on this prior to Armed forces training and education officer.  Awaiting information and then will submit to facility [facility].  Lane Hacker, MSW Clinical Social Work: [employer] Coverage for :  [phone]

## 2017-04-25 NOTE — Anesthesia Procedure Notes (Signed)
Procedure Name: Intubation Date/Time: 04/25/2017 2:16 PM Performed by: Doreen Salvage, CRNA Pre-anesthesia Checklist: Patient identified, Patient being monitored, Timeout performed, Emergency Drugs available and Suction available Patient Re-evaluated:Patient Re-evaluated prior to induction Oxygen Delivery Method: Circle system utilized Preoxygenation: Pre-oxygenation with 100% oxygen Induction Type: IV induction Ventilation: Mask ventilation without difficulty Laryngoscope Size: Mac and 3 Grade View: Grade I Tube type: Oral Tube size: 7.0 mm Number of attempts: 1 Airway Equipment and Method: Stylet Placement Confirmation: ETT inserted through vocal cords under direct vision,  positive ETCO2 and breath sounds checked- equal and bilateral Secured at: 21 cm Tube secured with: Tape Dental Injury: Teeth and Oropharynx as per pre-operative assessment

## 2017-04-25 NOTE — Clinical Social Work Note (Addendum)
CSW called [facility] [phone] to confirm that patient has Full Medicaid.  DSS office gave CSW the name of the Medicaid worker Shawna Orleans [phone].  DSS confirmed that patient does have full Medicaid related to disability, the number is [id number] effective 03/28/17.  CSW updated 2C unit CSW covering for today.  Jones Broom. Stella, MSW, [place]  04/25/2017 3:17 PM

## 2017-04-25 NOTE — Anesthesia Post-op Follow-up Note (Signed)
Anesthesia QCDR form completed.        

## 2017-04-25 NOTE — Anesthesia Preprocedure Evaluation (Signed)
Anesthesia Evaluation  Patient identified by MRN, date of birth, ID band Patient awake    Reviewed: Allergy & Precautions, NPO status , [name] Chart, lab work & pertinent test results  History of Anesthesia Complications Negative for: history of anesthetic complications  Airway Mallampati: III  TM Distance: >3 FB Neck ROM: Full    Dental  (+) Poor Dentition   Pulmonary asthma , sleep apnea (has refused BiPAP while in hospital) , COPD,  COPD inhaler and oxygen dependent,    breath sounds clear to auscultation- rhonchi (-) wheezing      Cardiovascular hypertension, Pt. on medications +CHF  (-) CAD, (-) Past MI, (-) Cardiac Stents and (-) CABG  Rhythm:Regular Rate:Normal - Systolic murmurs and - Diastolic murmurs Echo 8/84/16: - Left ventricle: The cavity size was mildly dilated. Systolic   function was normal. The estimated ejection fraction was 55%.   Wall motion was normal; there were no regional wall motion   abnormalities. Doppler parameters are consistent with abnormal   left ventricular relaxation (grade 1 diastolic dysfunction). - Mitral valve: There was mild regurgitation. - Atrial septum: No defect or patent foramen ovale was identified   Neuro/Psych PSYCHIATRIC DISORDERS Anxiety Depression negative neurological ROS     GI/Hepatic negative GI ROS, Neg liver ROS,   Endo/Other  diabetes, Insulin Dependent  Renal/GU ESRF and DialysisRenal disease (last dialysis 04/24/17)     Musculoskeletal negative musculoskeletal ROS (+)   Abdominal (+) + obese,   Peds  Hematology  (+) anemia ,   Anesthesia Other Findings Past Medical History: No date: (HFpEF) heart failure with preserved ejection fraction ([place])     Comment:  a. 11/2014 Echo: EF 50-55%, Gr1 DD; b. 06/2016 Echo: EF               60-65%, no rwma, mod dil LA, nl RV, triv eff. No date: Asthma No date: CHF (congestive heart failure) (HCC) No date: CKD  (chronic kidney disease), stage III (HCC) No date: COPD (chronic obstructive pulmonary disease) ([place])     Comment:  a. Home O2. No date: Diabetes mellitus without complication (HCC) No date: Hypertension No date: Iron deficiency anemia     Comment:  a. chronic blood loss->heavy menses. No date: Morbid obesity ([place]) No date: Pericardial effusion     Comment:  a. 11/2014 CT Chest: Mod effusion; b. 11/2014 Echo:               small to mod circumferential effusion. No hemodynamic               compromise. No date: Poorly controlled diabetes mellitus ([place])     Comment:  a. 11/2014 A1c 13.2.   Reproductive/Obstetrics                             Anesthesia Physical Anesthesia Plan  ASA: IV  Anesthesia Plan: General   Post-op Pain Management:    Induction: Intravenous  PONV Risk Score and Plan: 2 and Ondansetron and Midazolam  Airway Management Planned: Oral ETT  Additional Equipment:   Intra-op Plan:   Post-operative Plan: Extubation in OR and Possible Post-op intubation/ventilation  Informed Consent: I have reviewed the patients History and Physical, chart, labs and discussed the procedure including the risks, benefits and alternatives for the proposed anesthesia with the patient or authorized representative who has indicated his/her understanding and acceptance.   Dental advisory given  Plan Discussed with: CRNA and Anesthesiologist  Anesthesia  Plan Comments: (Has had significant total body fluid reduction via dialysis since procedure last week. )        Anesthesia Quick Evaluation

## 2017-04-25 NOTE — Progress Notes (Signed)
Patient ID: [name], female   DOB: 1970-09-13, 47 y.o.   MRN: [id number]  [facility] PROGRESS NOTE  IVANNAH ZODY EXH:[id number] DOB: [birth year] DOA: 03/24/2017 PCP: [name], NP  HPI/Subjective: Complains of left rib pain and she thinks it is due to gait belt.  Objective: Vitals:   04/25/17 1022 04/25/17 1026  BP: 127/71   Pulse: 91   Resp:  20  Temp:  98.3 F (36.8 C)  SpO2:  94%    Filed Weights   04/23/17 1934 04/24/17 0417  Weight: 129 kg (284 lb 4.8 oz) 129.5 kg (285 lb 6.4 oz)    ROS: Review of Systems  Constitutional: Negative for chills and fever.  Eyes: Negative for blurred vision.  Respiratory: Positive for shortness of breath. Negative for cough.   Cardiovascular: Negative for chest pain.  Gastrointestinal: Negative for abdominal pain, constipation, diarrhea, nausea and vomiting.  Genitourinary: Negative for dysuria.  Musculoskeletal: Negative for joint pain.  Neurological: Negative for dizziness and headaches.   Exam: Physical Exam  Constitutional: She is oriented to person, place, and time.  HENT:  Nose: No mucosal edema.  Mouth/Throat: No oropharyngeal exudate or posterior oropharyngeal edema.  Eyes: Conjunctivae, EOM and lids are normal. Pupils are equal, round, and reactive to light.  Neck: No JVD present. Carotid bruit is not present. No edema present. No thyroid mass and no thyromegaly present.  Cardiovascular: S1 normal and S2 normal. Tachycardia present. Exam reveals no gallop.  No murmur heard. Respiratory: No respiratory distress. She has decreased breath sounds in the right lower field and the left lower field. She has no wheezes. She has no rhonchi. She has no rales.  GI: Soft. Bowel sounds are normal. There is no tenderness.  Musculoskeletal:       Right ankle: She exhibits swelling.       Left ankle: She exhibits swelling.  Lymphadenopathy:    She has no cervical adenopathy.  Neurological: She is alert and oriented to  person, place, and time. No cranial nerve deficit.  Skin: Skin is warm. No rash noted. Nails show no clubbing.  Psychiatric: She has a normal mood and affect.      Data Reviewed: Basic Metabolic Panel: Recent Labs  Lab 04/21/17 0601 04/24/17 1605  NA 130*  --   K 4.4  --   CL 90*  --   CO2 30  --   GLUCOSE 188*  --   BUN 56*  --   CREATININE 3.93*  --   CALCIUM 8.5*  --   PHOS  --  2.4*   CBC: Recent Labs  Lab 04/21/17 0601 04/24/17 1215  WBC 8.3 12.4*  HGB 8.0* 8.0*  HCT 26.0* 26.5*  MCV 85.9 86.3  PLT 321 338   BNP (last 3 results) Recent Labs    01/07/17 1039 03/03/17 1114 03/27/17 0702  BNP 456.0* 617.0* 577.0*     CBG: Recent Labs  Lab 04/24/17 1733 04/24/17 2046 04/25/17 0149 04/25/17 0731 04/25/17 1140  GLUCAP 167* 126* 193* 173* 123*    Recent Results (from the past 240 hour(s))  MRSA PCR Screening     Status: None   Collection Time: 04/18/17 12:37 PM  Result Value Ref Range Status   MRSA by PCR NEGATIVE NEGATIVE Final    Comment:        The GeneXpert MRSA Assay (FDA approved for NASAL specimens only), is one component of a comprehensive MRSA colonization surveillance program. It is not intended to  diagnose MRSA infection nor to guide or monitor treatment for MRSA infections. Performed at [facility], [address] [place], [place] [address]   Culture, blood (routine x 2)     Status: None (Preliminary result)   Collection Time: 04/24/17 12:15 PM  Result Value Ref Range Status   Specimen Description BLOOD NA  Final   Special Requests   Final    BOTTLES DRAWN AEROBIC AND ANAEROBIC Blood Culture adequate volume   Culture   Final    NO GROWTH < 24 HOURS Performed at [facility], [address] [place], [place] [address]    Report Status PENDING  Incomplete  Culture, blood (routine x 2)     Status: None (Preliminary result)   Collection Time: 04/24/17 12:17 PM  Result Value Ref Range Status   Specimen  Description BLOOD NA  Final   Special Requests   Final    BOTTLES DRAWN AEROBIC AND ANAEROBIC Blood Culture adequate volume   Culture   Final    NO GROWTH < 24 HOURS Performed at [facility], [address], [place],  [address]    Report Status PENDING  Incomplete     Studies: No results found.  Scheduled Meds: . aspirin EC  81 mg Oral Daily  . atorvastatin  80 mg Oral q1800  . epoetin (EPOGEN/PROCRIT) injection  4,000 Units Intravenous Q T,Th,Sa-HD  . feeding supplement (NEPRO CARB STEADY)  237 mL Oral TID WC  . heparin  5,000 Units Subcutaneous Q8H  . hydrocerin   Topical BID  . insulin aspart  0-15 Units Subcutaneous TID WC  . insulin aspart  0-5 Units Subcutaneous QHS  . insulin aspart  3 Units Subcutaneous TID WC  . insulin glargine  12 Units Subcutaneous QHS  . metoprolol tartrate  50 mg Oral TID  . multivitamin  1 tablet Oral QHS  . polyethylene glycol  17 g Oral Daily  . sevelamer carbonate  800 mg Oral TID WC  . sodium chloride  2 spray Each Nare QID  . sodium chloride flush  3 mL Intravenous Q12H  . torsemide  100 mg Oral Daily   Continuous Infusions: . sodium chloride    . sodium chloride    . iron sucrose Stopped (04/24/17 1457)    Assessment/Plan:  1. Acute on chronic hypoxic hypercarbic respiratory failure secondary to acute CHF.  Patient on 4 L of oxygen.  She has the BiPAP at bedside I encouraged her to wear it she did not wear it last night.  Wear BiPAP at night. 2. Acute on chronic diastolic congestive heart failure.  Dialysis to help out with fluid management.  Continue metoprolol. 3. Accelerated hypertension tachycardia increase metoprolol dose to 50 mg 3 times daily, continue torsemide 100 mg p.o. daily. 4. End-stage renal disease. Nephrology following for dialysis.  Going for AV fistula placement today.,  5.   Anemia of chronic disease with iron deficiency also.  Neupogen and IV iron with hemodialysis 6. Morbid obesity and likely  underlying sleep apnea 7. Hyperlipidemia unspecified on atorvastatin 8. Anxiety on Xanax 9. Type 2 diabetes mellitus on Lantus and sliding scale 10. Weakness.  Social worker looking into rehab with [place] 11. Gradual decline in vision will need ophthalmology as outpatient  Code Status:     Code Status Orders  (From admission, onward)        Start     Ordered   03/24/17 1951  Full code  Continuous  03/24/17 1952    Code Status History    Date Active Date Inactive Code Status Order ID Comments User Context   03/03/2017 14:20 03/10/2017 21:31 Full Code [id number]  Fritzi Mandes, MD ED   01/07/2017 17:25 01/10/2017 19:00 Full Code [id number]  Demetrios Loll, MD Inpatient   11/08/2016 04:50 11/16/2016 17:17 Full Code [id number]  Harvie Bridge, DO Inpatient   06/25/2016 14:47 06/29/2016 18:15 Full Code [id number]  Hillary Bow, MD ED   08/09/2015 17:57 08/13/2015 17:21 Full Code [id number]  Bettey Costa, MD Inpatient   12/31/2014 20:25 01/03/2015 17:49 Full Code [id number]  Lytle Butte, MD ED   12/15/2014 11:54 12/19/2014 16:57 Full Code [id number]  Hillary Bow, MD ED     Family Communication: Husband at the bedside Disposition Plan: Likely discharge over the weekend to [place] [facility]  Consultants:  Nephrology  Vascular surgery  Time spent: 28 minutes  [place]

## 2017-04-25 NOTE — Op Note (Signed)
[facility]   OPERATIVE NOTE   PROCEDURE: Left brachiocephalic arteriovenous fistula placement  PRE-OPERATIVE DIAGNOSIS: 1.  ESRD        POST-OPERATIVE DIAGNOSIS: 1. ESRD       SURGEON: [name], MD  ASSISTANT(S): none  ANESTHESIA: general  ESTIMATED BLOOD LOSS: 10 cc  FINDING(S): Adequate cephalic vein for fistula creation  SPECIMEN(S):  none  INDICATIONS:   [name] is a 47 y.o. female who presents with renal failure in need of pemanent dialysis acces.  The patient is scheduled for left arm AVF placement.  The patient is aware the risks include but are not limited to: bleeding, infection, steal syndrome, nerve damage, ischemic monomelic neuropathy, failure to mature, and need for additional procedures.  The patient is aware of the risks of the procedure and elects to proceed forward.  DESCRIPTION: After full informed written consent was obtained from the patient, the patient was brought back to the operating room and placed supine upon the operating table.  Prior to induction, the patient received IV antibiotics.   After obtaining adequate anesthesia, the patient was then prepped and draped in the standard fashion for a left arm access procedure.  I made a curvilinear incision at the level of the antecubital fossa and dissected through the subcutaneous tissue and fascia to gain exposure of the brachial artery.  Initially, there was a small artery that was more superficial that would not have been large enough for fistula creation.  She apparently had a high bifurcation of her brachial artery.  Beneath this, we dissected out the brachial artery itself or the larger of the 2 branches which was of good size.  This was noted to be patent and adequate in size for fistula creation.  This was dissected out proximally and distally and prepared for control with vessel loops .  I then dissected out the cephalic vein.  This was noted to be patent and adequate in  size for fistula creation.  This was a pleasant surprise as her preoperative vein mapping had suggested this would not be an adequate size cephalic vein.  It was actually quite large measuring between 4 and 5 mm.  I then gave the patient 4000 units of intravenous heparin.  The vein was marked for orientation and the distal segment of the vein was ligated with a  2-0 silk, and the vein was transected.  I then instilled the heparinized saline into the vein and clamped it.  At this point, I reset my exposure of the brachial artery and pulled up control on the vessel loops.  I made an arteriotomy with a #11 blade, and then I extended the arteriotomy with a Potts scissor.  I injected heparinized saline proximal and distal to this arteriotomy.  The vein was then sewn to the artery in an end-to-side configuration with a running stitch of 6-0 Prolene.  Prior to completing this anastomosis, I allowed the vein and artery to backbleed.  There was no evidence of clot from any vessels.  I completed the anastomosis in the usual fashion and then released all vessel loops and clamps.  There was a palpable  thrill in the venous outflow, and there was a palpable pulse in the artery distal to the anastomosis.  At this point, I irrigated out the surgical wound.  Surgicel was placed. There was no further active bleeding.  The subcutaneous tissue was reapproximated with a running stitch of 3-0 Vicryl.  The skin was then closed  with a 4-0 Monocryl suture.  The skin was then cleaned, dried, and reinforced with Dermabond.  The patient tolerated this procedure well and was taken to the recovery room in stable condition  COMPLICATIONS: None  CONDITION: Stable   [name]    04/25/2017, 4:07 PM  This note was created with Dragon Medical transcription system. Any errors in dictation are purely unintentional.

## 2017-04-25 NOTE — Transfer of Care (Signed)
Immediate Anesthesia Transfer of Care Note  Patient: [name]  Procedure(s) Performed: Procedure(s): ARTERIOVENOUS (AV) FISTULA CREATION (Left)  Patient Location: PACU  Anesthesia Type:General  Level of Consciousness: sedated  Airway & Oxygen Therapy: Patient Spontanous Breathing and Patient connected to face mask oxygen  Post-op Assessment: Report given to RN and Post -op Vital signs reviewed and stable  Post vital signs: Reviewed and stable  Last Vitals:  Vitals:   04/25/17 1349 04/25/17 1626  BP: 138/89 133/90  Pulse: 82 84  Resp: 17 (!) 22  Temp: 36.4 C (!) 36 C  SpO2: 64% 38%    Complications: No apparent anesthesia complications

## 2017-04-25 NOTE — H&P (Signed)
[facility] History & Physical Update  The patient was interviewed and re-examined.  The patient's previous History and Physical has been reviewed and is unchanged.  There is no change in the plan of care. We plan to proceed with the scheduled procedure.  [name], MD  04/25/2017, 12:30 PM

## 2017-04-25 NOTE — Clinical Social Work Note (Signed)
CSW spoke to patient and her husband, and she has agreed to go to [facility].  CSW spoke to [facility], and they have agreed to accept patient with a 30 day LOG.  CSW explained to patient and her husband, that [employer] will be able to provide transportation to her dialysis treatments as well.  CSW to continue to follow patient's progress throughout discharge planning.  Jones Broom. Sallis, MSW, [place]  04/25/2017 9:21 AM

## 2017-04-25 NOTE — Progress Notes (Signed)
[facility]  ROUNDING NOTE   Subjective:   Hemodialysis treatment yesterday. Tolerated treatment well. UF of 1.8 liters.   Husband at bedside.   Objective:  Vital signs in last 24 hours:  Temp:  [98.3 F (36.8 C)-99.1 F (37.3 C)] 98.3 F (36.8 C) (03/01 1026) Pulse Rate:  [86-112] 91 (03/01 1022) Resp:  [18-31] 20 (03/01 1026) BP: (110-169)/(71-104) 127/71 (03/01 1022) SpO2:  [94 %-100 %] 94 % (03/01 1026)  Weight change:  Filed Weights   04/23/17 1934 04/24/17 0417  Weight: 129 kg (284 lb 4.8 oz) 129.5 kg (285 lb 6.4 oz)    Intake/Output: I/O last 3 completed shifts: In: 240 [P.O.:240] Out: 1240 [Other:1240]   Intake/Output this shift:  No intake/output data recorded.  Physical Exam: General: NAD, sitting in chair  Head: Normocephalic, atraumatic. Moist oral mucosal membranes  Eyes: Anicteric, PERRL  Neck: Supple, trachea midline  Lungs:  Clear to auscultation  Heart: Regular rate and rhythm  Abdomen:  Soft, nontender, obese  Extremities:  + peripheral edema.  Neurologic: Nonfocal, moving all four extremities  Skin: No lesions  Access: Left IJ permcath    Basic Metabolic Panel: Recent Labs  Lab 04/21/17 0601 04/24/17 1605  NA 130*  --   K 4.4  --   CL 90*  --   CO2 30  --   GLUCOSE 188*  --   BUN 56*  --   CREATININE 3.93*  --   CALCIUM 8.5*  --   PHOS  --  2.4*    Liver Function Tests: No results for input(s): AST, ALT, ALKPHOS, BILITOT, PROT, ALBUMIN in the last 168 hours. No results for input(s): LIPASE, AMYLASE in the last 168 hours. No results for input(s): AMMONIA in the last 168 hours.  CBC: Recent Labs  Lab 04/21/17 0601 04/24/17 1215  WBC 8.3 12.4*  HGB 8.0* 8.0*  HCT 26.0* 26.5*  MCV 85.9 86.3  PLT 321 338    Cardiac Enzymes: No results for input(s): CKTOTAL, CKMB, CKMBINDEX, TROPONINI in the last 168 hours.  BNP: Invalid input(s): POCBNP  CBG: Recent Labs  Lab 04/24/17 1733 04/24/17 2046  04/25/17 0149 04/25/17 0731 04/25/17 1140  GLUCAP 167* 126* 193* 173* 22*    Microbiology: Results for orders placed or performed during the hospital encounter of 03/24/17  MRSA PCR Screening     Status: None   Collection Time: 03/25/17  5:50 AM  Result Value Ref Range Status   MRSA by PCR NEGATIVE NEGATIVE Final    Comment:        The GeneXpert MRSA Assay (FDA approved for NASAL specimens only), is one component of a comprehensive MRSA colonization surveillance program. It is not intended to diagnose MRSA infection nor to guide or monitor treatment for MRSA infections. Performed at [facility], [address] [place], [place] [address]   MRSA PCR Screening     Status: None   Collection Time: 04/18/17 12:37 PM  Result Value Ref Range Status   MRSA by PCR NEGATIVE NEGATIVE Final    Comment:        The GeneXpert MRSA Assay (FDA approved for NASAL specimens only), is one component of a comprehensive MRSA colonization surveillance program. It is not intended to diagnose MRSA infection nor to guide or monitor treatment for MRSA infections. Performed at [facility], [address] [place],  [address]   Culture, blood (routine x 2)     Status: None (Preliminary result)   Collection  Time: 04/24/17 12:15 PM  Result Value Ref Range Status   Specimen Description BLOOD NA  Final   Special Requests   Final    BOTTLES DRAWN AEROBIC AND ANAEROBIC Blood Culture adequate volume   Culture   Final    NO GROWTH < 24 HOURS Performed at [facility], [address] [place], [place] [address]    Report Status PENDING  Incomplete  Culture, blood (routine x 2)     Status: None (Preliminary result)   Collection Time: 04/24/17 12:17 PM  Result Value Ref Range Status   Specimen Description BLOOD NA  Final   Special Requests   Final    BOTTLES DRAWN AEROBIC AND ANAEROBIC Blood Culture adequate volume   Culture   Final    NO GROWTH < 24  HOURS Performed at [facility], [address], [place],  [address]    Report Status PENDING  Incomplete    Coagulation Studies: No results for input(s): LABPROT, INR in the last 72 hours.  Urinalysis: No results for input(s): COLORURINE, LABSPEC, PHURINE, GLUCOSEU, HGBUR, BILIRUBINUR, KETONESUR, PROTEINUR, UROBILINOGEN, NITRITE, LEUKOCYTESUR in the last 72 hours.  Invalid input(s): APPERANCEUR    Imaging: No results found.   Medications:   . sodium chloride    . sodium chloride    . iron sucrose Stopped (04/24/17 1457)   . aspirin EC  81 mg Oral Daily  . atorvastatin  80 mg Oral q1800  . epoetin (EPOGEN/PROCRIT) injection  4,000 Units Intravenous Q T,Th,Sa-HD  . feeding supplement (NEPRO CARB STEADY)  237 mL Oral TID WC  . heparin  5,000 Units Subcutaneous Q8H  . hydrocerin   Topical BID  . insulin aspart  0-15 Units Subcutaneous TID WC  . insulin aspart  0-5 Units Subcutaneous QHS  . insulin aspart  3 Units Subcutaneous TID WC  . insulin glargine  12 Units Subcutaneous QHS  . metoprolol tartrate  50 mg Oral TID  . multivitamin  1 tablet Oral QHS  . polyethylene glycol  17 g Oral Daily  . sevelamer carbonate  800 mg Oral TID WC  . sodium chloride  2 spray Each Nare QID  . sodium chloride flush  3 mL Intravenous Q12H  . torsemide  100 mg Oral Daily   sodium chloride, acetaminophen **OR** acetaminophen, ALPRAZolam, cyclobenzaprine, diphenhydrAMINE, ipratropium-albuterol, lactulose, ondansetron **OR** ondansetron (ZOFRAN) IV, oxyCODONE, sodium chloride flush  Assessment/ Plan:  [name] is a 47 y.o. black female with end stage renal disease new start, diastolic congestive heart failure, hypertension, diabetes mellitus type II, anemia,   1. End-stage renal disease: 24 hr urine Creatinine clearance is 16 cc/min (04/04/2017). Seen and examined on hemodialysis. Tolerating treatment well. Over 32 kg removed with dialysis since initiated. - TTS  schedule.  -  AVF placement for today - Hemodialysis for tomorrow.  - Outpatient planning for [facility].    2. Hypertension and Acute on chronic diastolic congestive heart failure (grade 1 diastolic dysfunction)  - Continue to perform perform hemodialysis with ultrafiltration.   - torsemide 100mg  daily PO  3. Anemia of chronic kidney disease: With iron deficiency - EPO with HD treatments TTS - IV Iron TTS  4. Secondary Hyperparathyroidism: Calcium and phosphorus at goal. PTH low at 134.     LOS: [place] 3/1/201912:00 PM

## 2017-04-26 LAB — BASIC METABOLIC PANEL
ANION GAP: 10 (ref 5–15)
BUN: 46 mg/dL — ABNORMAL HIGH (ref 6–20)
CALCIUM: 8.7 mg/dL — AB (ref 8.9–10.3)
CO2: 26 mmol/L (ref 22–32)
Chloride: 95 mmol/L — ABNORMAL LOW (ref 101–111)
Creatinine, Ser: 3.97 mg/dL — ABNORMAL HIGH (ref 0.44–1.00)
GFR, EST AFRICAN AMERICAN: 15 mL/min — AB (ref >60)
GFR, EST NON AFRICAN AMERICAN: 13 mL/min — AB (ref >60)
GLUCOSE: 266 mg/dL — AB (ref 65–99)
Potassium: 5 mmol/L (ref 3.5–5.1)
SODIUM: 131 mmol/L — AB (ref 135–145)

## 2017-04-26 LAB — GLUCOSE, CAPILLARY
GLUCOSE-CAPILLARY: 241 mg/dL — AB (ref 65–99)
GLUCOSE-CAPILLARY: 152 mg/dL — AB (ref 65–99)
GLUCOSE-CAPILLARY: 204 mg/dL — AB (ref 65–99)
Glucose-Capillary: 135 mg/dL — ABNORMAL HIGH (ref 65–99)
Glucose-Capillary: 206 mg/dL — ABNORMAL HIGH (ref 65–99)

## 2017-04-26 MED ORDER — LACTULOSE 10 GM/15ML PO SOLN: 20.0000 g | Freq: Every day | ORAL | 0 refills | Status: DC | PRN

## 2017-04-26 MED ORDER — INSULIN ASPART 100 UNIT/ML ~~LOC~~ SOLN: 0.0000 [IU] | Freq: Every day | SUBCUTANEOUS | 11 refills | Status: DC

## 2017-04-26 MED ORDER — HYDROMORPHONE HCL 1 MG/ML IJ SOLN
2.0000 mg | Freq: Once | INTRAMUSCULAR | Status: DC
  Filled 2017-04-26: qty 2

## 2017-04-26 MED ORDER — SEVELAMER CARBONATE 800 MG PO TABS: 800.0000 mg | ORAL_TABLET | Freq: Three times a day (TID) | ORAL | 0 refills | Status: DC

## 2017-04-26 MED ORDER — INSULIN ASPART 100 UNIT/ML ~~LOC~~ SOLN: 3.0000 [IU] | Freq: Three times a day (TID) | SUBCUTANEOUS | 11 refills | Status: DC

## 2017-04-26 MED ORDER — OXYCODONE HCL 5 MG PO TABS: 5.0000 mg | ORAL_TABLET | Freq: Four times a day (QID) | ORAL | 0 refills | Status: DC | PRN

## 2017-04-26 MED ORDER — HYDROMORPHONE HCL 1 MG/ML IJ SOLN
1.0000 mg | Freq: Once | INTRAMUSCULAR | Status: AC
  Administered 2017-04-26: 1 mg via INTRAVENOUS

## 2017-04-26 MED ORDER — INSULIN ASPART 100 UNIT/ML ~~LOC~~ SOLN: 0.0000 [IU] | Freq: Three times a day (TID) | SUBCUTANEOUS | 11 refills | Status: DC

## 2017-04-26 NOTE — Clinical Social Work Note (Addendum)
CSW has sent a message to the administrator for [facility] and is waiting for a call back to determine if a bed is available over the weekend.   Per Roderic Palau at [facility], they are unable to verify this patient's insurance over the weekend. They will be able to make a determination about acceptance on Monday. CSW is following.  Santiago Bumpers, MSW, Latanya Presser [phone]

## 2017-04-26 NOTE — Progress Notes (Signed)
[facility]  Daily Progress Note   Subjective  - 1 Day Post-Op  Arm a little sore but not too bad.  No major events overnight.  Breathing well  Objective Vitals:   04/25/17 1756 04/25/17 1825 04/25/17 2039 04/26/17 0457  BP: 131/82 121/75 120/71 114/67  Pulse: 82 81 87 82  Resp:  17 (!) 22 20  Temp: 97.6 F (36.4 C) 98 F (36.7 C) 98.7 F (37.1 C) 98.3 F (36.8 C)  TempSrc:  Oral Oral Oral  SpO2: 100% 99% 97% 97%  Weight:      Height:        Intake/Output Summary (Last 24 hours) at 04/26/2017 0857 Last data filed at 04/25/2017 1800 Gross per 24 hour  Intake 300 ml  Output 25 ml  Net 275 ml    PULM  CTAB CV  RRR VASC  good bruit in left brachiocephalic AV fistula with soft thrill.  Incision is clean, dry, and intact  Laboratory CBC    Component Value Date/Time   WBC 12.4 (H) 04/24/2017 1215   HGB 8.0 (L) 04/24/2017 1215   HGB 12.4 01/11/2014 1324   HCT 26.5 (L) 04/24/2017 1215   HCT 39.2 01/11/2014 1324   PLT 338 04/24/2017 1215   PLT 370 01/11/2014 1324    BMET    Component Value Date/Time   NA 131 (L) 04/26/2017 0609   NA 135 (L) 11/21/2013 1153   K 5.0 04/26/2017 0609   K 4.1 11/21/2013 1153   CL 95 (L) 04/26/2017 0609   CL 98 11/21/2013 1153   CO2 26 04/26/2017 0609   CO2 30 11/21/2013 1153   GLUCOSE 266 (H) 04/26/2017 0609   GLUCOSE 287 (H) 11/21/2013 1153   BUN 46 (H) 04/26/2017 0609   BUN 16 11/21/2013 1153   CREATININE 3.97 (H) 04/26/2017 0609   CREATININE 0.85 11/21/2013 1153   CALCIUM 8.7 (L) 04/26/2017 0609   CALCIUM 8.7 11/21/2013 1153   GFRNONAA 13 (L) 04/26/2017 0609   GFRNONAA >60 11/21/2013 1153   GFRAA 15 (L) 04/26/2017 0609   GFRAA >60 11/21/2013 1153    Assessment/Planning: POD #1 s/p left brachiocephalic AV fistula   Doing well  Stable for discharge from a vascular point of view.  Can follow-up with me or my PA in the office in 5-6 weeks  Would use aspirin at discharge.    [name]  04/26/2017, 8:57 AM

## 2017-04-26 NOTE — Progress Notes (Signed)
[facility]  ROUNDING NOTE   Subjective:   Seen and examined on hemodialysis. Tolerating treatment well. UF goal of 2-3 liters  Left AVF creation yesterday (3/1) by Dr. Lucky Cowboy   HEMODIALYSIS FLOWSHEET:  Blood Flow Rate (mL/min): 350 mL/min Arterial Pressure (mmHg): -170 mmHg Venous Pressure (mmHg): 170 mmHg Transmembrane Pressure (mmHg): 70 mmHg Ultrafiltration Rate (mL/min): 710 mL/min Dialysate Flow Rate (mL/min): 600 ml/min Conductivity: Machine : 15.5 Conductivity: Machine : 15.5 Dialysis Fluid Bolus: Normal Saline Bolus Amount (mL): 250 mL     Objective:  Vital signs in last 24 hours:  Temp:  [96.8 F (36 C)-98.7 F (37.1 C)] 98.7 F (37.1 C) (03/02 1040) Pulse Rate:  [79-93] 92 (03/02 1203) Resp:  [12-22] 15 (03/02 1203) BP: (114-144)/(67-90) 124/85 (03/02 1200) SpO2:  [94 %-100 %] 100 % (03/02 1040) FiO2 (%):  [60 %] 60 % (03/01 1711)  Weight change:  Filed Weights   04/23/17 1934 04/24/17 0417  Weight: 129 kg (284 lb 4.8 oz) 129.5 kg (285 lb 6.4 oz)    Intake/Output: I/O last 3 completed shifts: In: 300 [I.V.:250; IV Piggyback:50] Out: 25 [Blood:25]   Intake/Output this shift:  No intake/output data recorded.  Physical Exam: General: NAD, sitting in chair  Head: Normocephalic, atraumatic. Moist oral mucosal membranes  Eyes: Anicteric, PERRL  Neck: Supple, trachea midline  Lungs:  Clear to auscultation  Heart: Regular rate and rhythm  Abdomen:  Soft, nontender, obese  Extremities:  + peripheral edema.  Neurologic: Nonfocal, moving all four extremities  Skin: No lesions  Access: Left IJ permcath    Basic Metabolic Panel: Recent Labs  Lab 04/21/17 0601 04/24/17 1605 04/26/17 0609  NA 130*  --  131*  K 4.4  --  5.0  CL 90*  --  95*  CO2 30  --  26  GLUCOSE 188*  --  266*  BUN 56*  --  46*  CREATININE 3.93*  --  3.97*  CALCIUM 8.5*  --  8.7*  PHOS  --  2.4*  --     Liver Function Tests: No results for input(s): AST, ALT,  ALKPHOS, BILITOT, PROT, ALBUMIN in the last 168 hours. No results for input(s): LIPASE, AMYLASE in the last 168 hours. No results for input(s): AMMONIA in the last 168 hours.  CBC: Recent Labs  Lab 04/21/17 0601 04/24/17 1215  WBC 8.3 12.4*  HGB 8.0* 8.0*  HCT 26.0* 26.5*  MCV 85.9 86.3  PLT 321 338    Cardiac Enzymes: No results for input(s): CKTOTAL, CKMB, CKMBINDEX, TROPONINI in the last 168 hours.  BNP: Invalid input(s): POCBNP  CBG: Recent Labs  Lab 04/25/17 1140 04/25/17 1354 04/25/17 1837 04/25/17 2125 04/26/17 0729  GLUCAP 123* 124* 153* 282* 241*    Microbiology: Results for orders placed or performed during the hospital encounter of 03/24/17  MRSA PCR Screening     Status: None   Collection Time: 03/25/17  5:50 AM  Result Value Ref Range Status   MRSA by PCR NEGATIVE NEGATIVE Final    Comment:        The GeneXpert MRSA Assay (FDA approved for NASAL specimens only), is one component of a comprehensive MRSA colonization surveillance program. It is not intended to diagnose MRSA infection nor to guide or monitor treatment for MRSA infections. Performed at [facility], [address], [place], [place] [address]   MRSA PCR Screening     Status: None   Collection Time: 04/18/17 12:37 PM  Result Value Ref  Range Status   MRSA by PCR NEGATIVE NEGATIVE Final    Comment:        The GeneXpert MRSA Assay (FDA approved for NASAL specimens only), is one component of a comprehensive MRSA colonization surveillance program. It is not intended to diagnose MRSA infection nor to guide or monitor treatment for MRSA infections. Performed at [facility], [address] [place], [place] [address]   Culture, blood (routine x 2)     Status: None (Preliminary result)   Collection Time: 04/24/17 12:15 PM  Result Value Ref Range Status   Specimen Description BLOOD NA  Final   Special Requests   Final    BOTTLES DRAWN AEROBIC AND ANAEROBIC  Blood Culture adequate volume   Culture   Final    NO GROWTH 2 DAYS Performed at [facility], [address] [place], [place] [address]    Report Status PENDING  Incomplete  Culture, blood (routine x 2)     Status: None (Preliminary result)   Collection Time: 04/24/17 12:17 PM  Result Value Ref Range Status   Specimen Description BLOOD NA  Final   Special Requests   Final    BOTTLES DRAWN AEROBIC AND ANAEROBIC Blood Culture adequate volume   Culture   Final    NO GROWTH 2 DAYS Performed at [facility], [address], [place], [place] [address]    Report Status PENDING  Incomplete    Coagulation Studies: No results for input(s): LABPROT, INR in the last 72 hours.  Urinalysis: No results for input(s): COLORURINE, LABSPEC, PHURINE, GLUCOSEU, HGBUR, BILIRUBINUR, KETONESUR, PROTEINUR, UROBILINOGEN, NITRITE, LEUKOCYTESUR in the last 72 hours.  Invalid input(s): APPERANCEUR    Imaging: No results found.   Medications:   . sodium chloride Stopped (04/25/17 1629)  . iron sucrose Stopped (04/24/17 1457)   . aspirin EC  81 mg Oral Daily  . atorvastatin  80 mg Oral q1800  . epoetin (EPOGEN/PROCRIT) injection  4,000 Units Intravenous Q T,Th,Sa-HD  . feeding supplement (NEPRO CARB STEADY)  237 mL Oral TID WC  . heparin  5,000 Units Subcutaneous Q8H  . hydrocerin   Topical BID  . insulin aspart  0-15 Units Subcutaneous TID WC  . insulin aspart  0-5 Units Subcutaneous QHS  . insulin aspart  3 Units Subcutaneous TID WC  . insulin glargine  12 Units Subcutaneous QHS  . metoprolol tartrate  50 mg Oral TID  . multivitamin  1 tablet Oral QHS  . polyethylene glycol  17 g Oral Daily  . sevelamer carbonate  800 mg Oral TID WC  . sodium chloride  2 spray Each Nare QID  . sodium chloride flush  3 mL Intravenous Q12H  . torsemide  100 mg Oral Daily   sodium chloride, acetaminophen **OR** acetaminophen, ALPRAZolam, cyclobenzaprine, diphenhydrAMINE,  HYDROcodone-acetaminophen, ipratropium-albuterol, lactulose, ondansetron **OR** ondansetron (ZOFRAN) IV, oxyCODONE, sodium chloride flush  Assessment/ Plan:  [name] is a 47 y.o. black female with end stage renal disease new start, diastolic congestive heart failure, hypertension, diabetes mellitus type II, anemia,   1. End-stage renal disease: 24 hr urine Creatinine clearance is 16 cc/min (04/04/2017). Seen and examined on hemodialysis. Tolerating treatment well.  Over 35 kg removed with dialysis since initiated. - TTS schedule.  - AVF placement 3/1 Dr. Lucky Cowboy - Outpatient planning for [facility].    2. Hypertension and Acute on chronic diastolic congestive heart failure (grade 1 diastolic dysfunction)  - Continue to perform perform hemodialysis with ultrafiltration.   -  torsemide 100mg  daily PO  3. Anemia of chronic kidney disease: With iron deficiency - EPO with HD treatments TTS - IV Iron TTS  4. Secondary Hyperparathyroidism: Calcium and phosphorus at goal. PTH low at 134.     LOS: [place] 3/2/201912:07 PM

## 2017-04-26 NOTE — Progress Notes (Signed)
POST DIALYSIS ASSESSMENT 

## 2017-04-26 NOTE — Progress Notes (Signed)
Patient ID: [name], female   DOB: [birth year], 47 y.o.   MRN: [id number]  [facility] PROGRESS NOTE  ALMIRA PHETTEPLACE DGU:[id number] DOB: 02-Jun-1970 DOA: 03/24/2017 PCP: [name], NP  HPI/Subjective:  discharge canceled because of insurance approval problem  As per can social worker.  sHe is going for hemodialysis.  Spoke with nephrology, he is agreeable for discharge today.  And patient also is looking forward for discharge. Objective: Vitals:   04/26/17 1203 04/26/17 1215  BP:  126/83  Pulse: 92 93  Resp: 15 20  Temp:    SpO2:      Filed Weights   04/23/17 1934 04/24/17 0417  Weight: 129 kg (284 lb 4.8 oz) 129.5 kg (285 lb 6.4 oz)    ROS: Review of Systems  Constitutional: Negative for chills and fever.  Eyes: Negative for blurred vision.  Respiratory: Positive for shortness of breath. Negative for cough.   Cardiovascular: Negative for chest pain.  Gastrointestinal: Negative for abdominal pain, constipation, diarrhea, nausea and vomiting.  Genitourinary: Negative for dysuria.  Musculoskeletal: Negative for joint pain.  Neurological: Negative for dizziness and headaches.   Exam: Physical Exam  Constitutional: She is oriented to person, place, and time.  HENT:  Nose: No mucosal edema.  Mouth/Throat: No oropharyngeal exudate or posterior oropharyngeal edema.  Eyes: Conjunctivae, EOM and lids are normal. Pupils are equal, round, and reactive to light.  Neck: No JVD present. Carotid bruit is not present. No edema present. No thyroid mass and no thyromegaly present.  Cardiovascular: S1 normal and S2 normal. Tachycardia present. Exam reveals no gallop.  No murmur heard. Respiratory: No respiratory distress. She has no decreased breath sounds. She has no wheezes. She has no rhonchi. She has no rales.  GI: Soft. Bowel sounds are normal. There is no tenderness.  Musculoskeletal:       Right ankle: She exhibits swelling.       Left ankle: She exhibits swelling.   Lymphadenopathy:    She has no cervical adenopathy.  Neurological: She is alert and oriented to person, place, and time. No cranial nerve deficit.  Skin: Skin is warm. No rash noted. Nails show no clubbing.  Psychiatric: She has a normal mood and affect.      Data Reviewed: Basic Metabolic Panel: Recent Labs  Lab 04/21/17 0601 04/24/17 1605 04/26/17 0609  NA 130*  --  131*  K 4.4  --  5.0  CL 90*  --  95*  CO2 30  --  26  GLUCOSE 188*  --  266*  BUN 56*  --  46*  CREATININE 3.93*  --  3.97*  CALCIUM 8.5*  --  8.7*  PHOS  --  2.4*  --    CBC: Recent Labs  Lab 04/21/17 0601 04/24/17 1215  WBC 8.3 12.4*  HGB 8.0* 8.0*  HCT 26.0* 26.5*  MCV 85.9 86.3  PLT 321 338   BNP (last 3 results) Recent Labs    01/07/17 1039 03/03/17 1114 03/27/17 0702  BNP 456.0* 617.0* 577.0*     CBG: Recent Labs  Lab 04/25/17 1140 04/25/17 1354 04/25/17 1837 04/25/17 2125 04/26/17 0729  GLUCAP 123* 124* 153* 282* 241*    Recent Results (from the past 240 hour(s))  MRSA PCR Screening     Status: None   Collection Time: 04/18/17 12:37 PM  Result Value Ref Range Status   MRSA by PCR NEGATIVE NEGATIVE Final    Comment:  The GeneXpert MRSA Assay (FDA approved for NASAL specimens only), is one component of a comprehensive MRSA colonization surveillance program. It is not intended to diagnose MRSA infection nor to guide or monitor treatment for MRSA infections. Performed at [facility], [address] [place], [place] [address]   Culture, blood (routine x 2)     Status: None (Preliminary result)   Collection Time: 04/24/17 12:15 PM  Result Value Ref Range Status   Specimen Description BLOOD NA  Final   Special Requests   Final    BOTTLES DRAWN AEROBIC AND ANAEROBIC Blood Culture adequate volume   Culture   Final    NO GROWTH 2 DAYS Performed at [facility], [address] [place], [place] [address]    Report Status PENDING  Incomplete   Culture, blood (routine x 2)     Status: None (Preliminary result)   Collection Time: 04/24/17 12:17 PM  Result Value Ref Range Status   Specimen Description BLOOD NA  Final   Special Requests   Final    BOTTLES DRAWN AEROBIC AND ANAEROBIC Blood Culture adequate volume   Culture   Final    NO GROWTH 2 DAYS Performed at [facility], [address], [place], [place] [address]    Report Status PENDING  Incomplete     Studies: No results found.  Scheduled Meds: . aspirin EC  81 mg Oral Daily  . atorvastatin  80 mg Oral q1800  . epoetin (EPOGEN/PROCRIT) injection  4,000 Units Intravenous Q T,Th,Sa-HD  . feeding supplement (NEPRO CARB STEADY)  237 mL Oral TID WC  . heparin  5,000 Units Subcutaneous Q8H  . hydrocerin   Topical BID  . insulin aspart  0-15 Units Subcutaneous TID WC  . insulin aspart  0-5 Units Subcutaneous QHS  . insulin aspart  3 Units Subcutaneous TID WC  . insulin glargine  12 Units Subcutaneous QHS  . metoprolol tartrate  50 mg Oral TID  . multivitamin  1 tablet Oral QHS  . polyethylene glycol  17 g Oral Daily  . sevelamer carbonate  800 mg Oral TID WC  . sodium chloride  2 spray Each Nare QID  . sodium chloride flush  3 mL Intravenous Q12H  . torsemide  100 mg Oral Daily   Continuous Infusions: . sodium chloride Stopped (04/25/17 1629)  . iron sucrose Stopped (04/24/17 1457)    Assessment/Plan:  1. Acute on chronic hypoxic hypercarbic respiratory failure secondary to acute CHF.  Patient on 4 L of oxygen.  She has the BiPAP at bedside I encouraged her to wear it she did not wear it last night.  Wear BiPAP at night. 2. Acute on chronic diastolic congestive heart failure.  Dialysis to help out with fluid management.  Continue metoprolol. 3. Accelerated hypertension tachycardia increase metoprolol dose to 50 mg 3 times daily, continue torsemide 100 mg p.o. daily. 4. End-stage renal disease. Nephrology following for dialysis.  Shunt had AV fistula  yesterday.  Anemia of chronic disease with iron deficiency also.  Neupogen and IV iron with hemodialysis 5. Morbid obesity and likely underlying sleep apnea 6. Hyperlipidemia unspecified on atorvastatin 7. Anxiety on Xanax 8. Type 2 diabetes mellitus on Lantus and sliding scale 9. Weakness.  Insurance approval pending for her to go to [facility] today.   10. t  Code Status:     Code Status Orders  (From admission, onward)        Start  Ordered   03/24/17 1951  Full code  Continuous     03/24/17 1952    Code Status History    Date Active Date Inactive Code Status Order ID Comments User Context   03/03/2017 14:20 03/10/2017 21:31 Full Code [id number]  Fritzi Mandes, MD ED   01/07/2017 17:25 01/10/2017 19:00 Full Code [id number]  Demetrios Loll, MD Inpatient   11/08/2016 04:50 11/16/2016 17:17 Full Code [id number]  Harvie Bridge, DO Inpatient   06/25/2016 14:47 06/29/2016 18:15 Full Code [id number]  Hillary Bow, MD ED   08/09/2015 17:57 08/13/2015 17:21 Full Code [id number]  Bettey Costa, MD Inpatient   12/31/2014 20:25 01/03/2015 17:49 Full Code [id number]  Lytle Butte, MD ED   12/15/2014 11:54 12/19/2014 16:57 Full Code [id number]  Hillary Bow, MD ED     Family Communication: Husband at the bedside Disposition Plan: Likely discharge over the weekend to [facility]  Consultants:  Nephrology  Vascular surgery  Time spent: 28 minutes  [place]

## 2017-04-26 NOTE — Progress Notes (Signed)
JD CP,[;ETED

## 2017-04-26 NOTE — Discharge Summary (Signed)
[name], is a 47 y.o. female  DOB 1970/03/06  MRN [id number].  Admission date:  03/24/2017  Admitting Physician  Hillary Bow, MD  Discharge Date:  04/26/2017   Primary MD  [name], NP  Recommendations for primary care physician for things to follow:   With PCP in 1 week Follow-up with nephrology routinely for hemodialysis.   Admission Diagnosis  Shortness of breath [R06.02] Elevated troponin [R74.8] Congestive heart failure, unspecified HF chronicity, unspecified heart failure type ([name]) [I50.9]   Discharge Diagnosis  Shortness of breath [R06.02] Elevated troponin [R74.8] Congestive heart failure, unspecified HF chronicity, unspecified heart failure type ([name]) [I50.9]    Principal Problem:   Hypertensive heart disease with congestive heart failure (HCC) Active Problems:   Obesity, morbid (HCC)   Shortness of breath   Iron deficiency anemia   Poorly controlled type 2 diabetes mellitus (HCC)   CKD (chronic kidney disease), stage III (HCC)   Chronic diastolic heart failure (HCC)   CHF (congestive heart failure) (HCC)   Obesity hypoventilation syndrome ([name])   Palliative care encounter   Adjustment disorder with mixed anxiety and depressed mood   Dysthymia   Chronic pain syndrome   DNR (do not resuscitate) discussion   Stage 4 chronic kidney disease ([name])   ESRD (end stage renal disease) ([name])   Palliative care by specialist   Goals of care, counseling/discussion      Past Medical History:  Diagnosis Date  . (HFpEF) heart failure with preserved ejection fraction ([name])    a. 11/2014 Echo: EF 50-55%, Gr1 DD; b. 06/2016 Echo: EF 60-65%, no rwma, mod dil LA, nl RV, triv eff.  . Asthma   . CHF (congestive heart failure) ([place])   . CKD (chronic kidney disease), stage III ([name])   . COPD (chronic  obstructive pulmonary disease) ([name])    a. Home O2.  . Diabetes mellitus without complication ([place])   . Hypertension   . Iron deficiency anemia    a. chronic blood loss->heavy menses.  . Morbid obesity ([name])   . Pericardial effusion    a. 11/2014 CT Chest: Mod effusion; b. 11/2014 Echo: small to mod circumferential effusion. No hemodynamic compromise.  [name] Poorly controlled diabetes mellitus ([name])    a. 11/2014 A1c 13.2.    Past Surgical History:  Procedure Laterality Date  . CESAREAN SECTION     times 3  . DIALYSIS/PERMA CATHETER INSERTION Right 04/04/2017   Procedure: DIALYSIS/PERMA CATHETER INSERTION;  Surgeon: Katha Cabal, MD;  Location: [name];  Service: Cardiovascular;  Laterality: Right;  . INSERTION OF DIALYSIS CATHETER N/A 04/16/2017   Procedure: INSERTION OF DIALYSIS PERM CATHETER;  Surgeon: Algernon Huxley, MD;  Location: [facility];  Service: Vascular;  Laterality: N/A;       History of present illness and  Hospital Course:     Kindly see H&P for history of present illness and admission details, please review complete Labs, Consult reports and Test reports for all details in brief  HPI  from the history and physical done on the day of admission 47 year old unfortunate female with morbid obesity, chronic diastolic heart failure, chronic kidney disease stage III, diabetes mellitus type 2 comes into emergency room for shortness of breath, weight gain.  Patient is here for more than a month.  Admitted at that time BNP 671.  Received Lasix, admitted to hospital for shortness of breath with acute heart failure.  Hospital Course  1 acute on chronic diastolic  heart failure.  Received a Lasix drip.  Seen by cardiology, nephrology.  Patient has chronic kidney disease stage IV, renal function continued to get worse despite diuretics so started on hemodialysis.  2.  ESRD: Patient kidney function deteriorated and now patient is ESRD as per nephrology.  Patient 24-hour  creatinine clearance showed 16 cc/min.  Patient started on hemodialysis Tuesday, Thursday, Saturday.  Patient has diabetes as outpatient dialysis chair.  Patient received many sessions of dialysis.  Received AV fistula yesterday.  Seen by vascular patient can see this morning, seems to be doing well.  Patient can follow-up with vascular as an outpatient in 5-6 weeks.  Patient will be on aspirin. 3 uncontrolled hypertension: Patient receiving hemodialysis.  Patient can take torsemide on nondialysis days as per nephrology recommendation.  Patient is on hemodialysis schedule on Tuesday, Thursday, Saturday at [name] [place]. . 4.  Anemia of chronic kidney disease with iron deficiency.  Patient needs Neupogen, IV iron with hemodialysis. #5 diabetes mellitus type 2: Continue insulin as  medication list. 6.  Morbid obesity, underlying sleep apnea..  And on 3-4 L of oxygen at home and also we will need to continue that at the nursing home also.  Patient is going to [employer] today.  After dialysis. 7 .deconditioning: Seen by physical therapy, recommending rehab, patient will go to [name] today.  Discharge Condition:stable   Follow UP  Follow-up Information    [name], [name] A, PA-C Follow up in 5 week(s).   Specialty:  Physician Assistant Why:  with HDA Contact information: [name] [id number] [phone]             Discharge Instructions  and  Discharge Medications      Allergies as of 04/26/2017      Reactions   Dust Mite Extract Shortness Of Breath, Itching   Mold Extract [trichophyton] Shortness Of Breath, Itching   Feraheme [ferumoxytol] Itching   Pollen Extract    Sulfa Antibiotics Itching      Medication List    STOP taking these medications   cefdinir 300 MG capsule Commonly known as:  OMNICEF   hydrALAZINE 100 MG tablet Commonly known as:  APRESOLINE   insulin aspart 100 UNIT/ML  FlexPen Commonly known as:  NOVOLOG FLEXPEN Replaced by:  insulin aspart 100 UNIT/ML injection   metolazone 5 MG tablet Commonly known as:  ZAROXOLYN   potassium chloride 10 MEQ tablet Commonly known as:  K-DUR     TAKE these medications   acetaminophen 500 MG tablet Commonly known as:  TYLENOL Take 1,000 mg by mouth every 6 (six) hours as needed for mild pain, fever or headache.   albuterol (2.5 MG/3ML) 0.083% nebulizer solution Commonly known as:  PROVENTIL Take 2.5 mg by nebulization every 6 (six) hours as needed for wheezing or shortness of breath.   albuterol 108 (90 Base) MCG/ACT inhaler Commonly known as:  PROVENTIL HFA;VENTOLIN HFA Inhale 2 puffs into the lungs every 6 (six) hours as needed for wheezing or shortness of breath.   aspirin EC 81 MG tablet Take 81 mg by mouth daily.   atorvastatin 80 MG tablet Commonly known as:  LIPITOR Take 1 tablet (80 mg total) by mouth daily at 6 PM.   citalopram 40 MG tablet Commonly known as:  CELEXA Take 1 tablet (40 mg total) by mouth daily.   cloNIDine 0.2 MG tablet Commonly known as:  CATAPRES Take 1 tablet (0.2 mg total) by  mouth 2 (two) times daily.   guaiFENesin-dextromethorphan 100-10 MG/5ML syrup Commonly known as:  ROBITUSSIN DM Take 5 mLs every 4 (four) hours as needed by mouth for cough.   HM VITAMIN D3 4000 units Caps Generic drug:  Cholecalciferol Take 1 capsule by mouth once a week.   insulin aspart 100 UNIT/ML injection Commonly known as:  novoLOG Inject 3 Units into the skin 3 (three) times daily with meals. Replaces:  insulin aspart 100 UNIT/ML FlexPen   insulin aspart 100 UNIT/ML injection Commonly known as:  novoLOG Inject 0-5 Units into the skin at bedtime.   insulin aspart 100 UNIT/ML injection Commonly known as:  novoLOG Inject 0-15 Units into the skin 3 (three) times daily with meals.   ipratropium-albuterol 0.5-2.5 (3) MG/3ML Soln Commonly known as:  DUONEB Take 3 mLs by nebulization  every 6 (six) hours as needed.   lactulose 10 GM/15ML solution Commonly known as:  CHRONULAC Take 30 mLs (20 g total) by mouth daily as needed for severe constipation.   losartan 50 MG tablet Commonly known as:  COZAAR Take 50 mg daily by mouth.   metoprolol tartrate 100 MG tablet Commonly known as:  LOPRESSOR Take 1 tablet (100 mg total) by mouth 2 (two) times daily.   oxyCODONE 5 MG immediate release tablet Commonly known as:  Oxy IR/ROXICODONE Take 1 tablet (5 mg total) by mouth every 6 (six) hours as needed for moderate pain.   polyethylene glycol packet Commonly known as:  MIRALAX / GLYCOLAX Take 17 g by mouth daily as needed for mild constipation.   sevelamer carbonate 800 MG tablet Commonly known as:  RENVELA Take 1 tablet (800 mg total) by mouth 3 (three) times daily with meals.   torsemide 100 MG tablet Commonly known as:  DEMADEX Take 1 tablet (100 mg total) by mouth 2 (two) times daily.            Durable Medical Equipment  (From admission, onward)        Start     Ordered   03/26/17 1454  For home use only DME Hospital bed  Once    Comments:  Bariatric hospital bed  Question Answer Comment  Bed type Heavy-duty, semi-electric (for patients >350 lbs.)   Trapeze Bar Yes   Support Surface: Low Air loss Mattress      03/26/17 1453        Diet and Activity recommendation: See Discharge Instructions above   Consults obtained -cardiology, nephrology, vascular surgery, physical therapy  Major procedures and Radiology Reports - PLEASE review detailed and final reports for all details, in brief -      [place] Vein Mapping Left  Addendum Date: 04/24/2017   ADDENDUM REPORT: 04/24/2017 14:34 ADDENDUM: Addendum created to address findings of left internal jugular vein. Left IJ vein is patent with maintained flow. There is heterogeneously echoic material adherent to the wall, potentially thrombus of uncertain chronicity. No comparison available.  Electronically Signed   By: Corrie Mckusick D.O.   On: 04/24/2017 14:34   Result Date: 04/24/2017 CLINICAL DATA:  47 year old female with renal failure EXAM: DUPLEX ULTRASOUND EXAM OF THE LEFT UPPER EXTREMITY VENOUS AND ARTERIAL SYSTEM COMPARISON:  None. FINDINGS: Directed duplex of the left upper extremity arterial system and venous system. Worksheet generated and stored. Duplex demonstrates triphasic waveform of left brachial artery, radial artery, and left ulnar artery. Directed duplex demonstrates patent brachial vein, cephalic vein, and basilic vein, with measurements as recorded on the worksheet. IMPRESSION: Directed duplex  of the left upper extremity arterial system demonstrates triphasic waveforms. Directed duplex of the venous system of the left upper extremity demonstrates patent brachial vein, cephalic vein, basilic vein, with measurements as recorded on the worksheet. Electronically Signed: By: Corrie Mckusick D.O. On: 04/24/2017 14:13   US Venous Img Lower Bilateral  Result Date: 04/01/2017 CLINICAL DATA:  Swelling x1 month EXAM: BILATERAL LOWER EXTREMITY VENOUS DOPPLER ULTRASOUND TECHNIQUE: Gray-scale sonography with compression, as well as color and duplex ultrasound, were performed to evaluate the deep venous system from the level of the common femoral vein through the popliteal and proximal calf veins. Technologist describes technically difficult examination secondary to diffuse anasarca involving abdomen and lower extremities. COMPARISON:  11/21/2013 FINDINGS: Normal compressibility of the common femoral, superficial femoral, and popliteal veins. Limited evaluation of the proximal calf veins secondary to anasarca. No filling defects to suggest DVT on grayscale or color Doppler imaging. Doppler waveforms show normal direction of venous flow, normal respiratory phasicity. Limited response to augmentation in bilateral segments of femoral and popliteal veins may be related to marked anasarca.  IMPRESSION: No evidence of lower extremity deep vein thrombosis, with limitations as above. Electronically Signed   By: Lucrezia Europe M.D.   On: 04/01/2017 14:51   Dg Chest Port 1 View  Result Date: 04/14/2017 CLINICAL DATA:  47 year old female with shortness of breath. EXAM: PORTABLE CHEST 1 VIEW COMPARISON:  04/04/2017 and earlier. FINDINGS: Large body habitus. Two Portable AP semi upright views at 1418 hrs. Stable left IJ approach central line. Large breast tissue projects over the right chest. However, right lung ventilation does appear decreased since 04/04/2017 with associated abrupt cut off of the right central airways (arrows on image 2). The left lung appears scratched at the left lung is stable in clear when allowing for portable technique. The left mediastinal contours are stable. Visualized tracheal air column is within normal limits. IMPRESSION: 1. Progressive right lung opacification since 04/04/2017 is nonspecific, but consider right central airway mucous plugging. 2. The left lung remains clear. Electronically Signed   By: Genevie Ann M.D.   On: 04/14/2017 14:42   Dg Chest Port 1 View  Result Date: 04/04/2017 CLINICAL DATA:  Status post central line placement. EXAM: PORTABLE CHEST 1 VIEW COMPARISON:  03/24/2017. FINDINGS: Interval left jugular catheter extending into the superior vena cava. The tip is not visualized due to under exposure of the overlying heart and soft tissues as a result of the large size of the patient and patient rotation to the right. The pulmonary vasculature and interstitial markings are mildly prominent. No gross change in enlargement of the cardiac silhouette. No pneumothorax is seen. The visualized bones are unremarkable. IMPRESSION: 1. Limited examination demonstrating an interval left jugular catheter with its tip not visualized, possibly at the junction of the superior vena cava and right atrium. 2. No pneumothorax. 3. Grossly stable cardiomegaly with mildly progressive  pulmonary vascular congestion and possible mild interstitial pulmonary edema. Electronically Signed   By: Claudie Revering M.D.   On: 04/04/2017 17:45   Dg C-arm 1-60 Min-no Report  Result Date: 04/16/2017 Fluoroscopy was utilized by the requesting physician.  No radiographic interpretation.    Micro Results    Recent Results (from the past 240 hour(s))  MRSA PCR Screening     Status: None   Collection Time: 04/18/17 12:37 PM  Result Value Ref Range Status   MRSA by PCR NEGATIVE NEGATIVE Final    Comment:        The  GeneXpert MRSA Assay (FDA approved for NASAL specimens only), is one component of a comprehensive MRSA colonization surveillance program. It is not intended to diagnose MRSA infection nor to guide or monitor treatment for MRSA infections. Performed at [name], [address] [place], [place] [address]   Culture, blood (routine x 2)     Status: None (Preliminary result)   Collection Time: 04/24/17 12:15 PM  Result Value Ref Range Status   Specimen Description BLOOD NA  Final   Special Requests   Final    BOTTLES DRAWN AEROBIC AND ANAEROBIC Blood Culture adequate volume   Culture   Final    NO GROWTH 2 DAYS Performed at [name], [address] [place], [name] [address]    Report Status PENDING  Incomplete  Culture, blood (routine x 2)     Status: None (Preliminary result)   Collection Time: 04/24/17 12:17 PM  Result Value Ref Range Status   Specimen Description BLOOD NA  Final   Special Requests   Final    BOTTLES DRAWN AEROBIC AND ANAEROBIC Blood Culture adequate volume   Culture   Final    NO GROWTH 2 DAYS Performed at [facility], [address], [place], [place] [address]    Report Status PENDING  Incomplete       Today   Subjective:   Katheline Brendlinger today has no headache,no chest abdominal pain,no new weakness tingling or numbness, stable for discharge to [facility] after dialysis.  Spoke with  patient.   Objective:   Blood pressure 114/67, pulse 82, temperature 98.3 F (36.8 C), temperature source Oral, resp. rate 20, height 5\' 4"  (1.626 m), weight 129.5 kg (285 lb 6.4 oz), last menstrual period 09/06/2016, SpO2 97 %.   Intake/Output Summary (Last 24 hours) at 04/26/2017 1016 Last data filed at 04/25/2017 1800 Gross per 24 hour  Intake 300 ml  Output 25 ml  Net 275 ml    Exam Awake Alert, Oriented x 3, No new F.N deficits, Normal affect [place].AT,PERRAL Supple Neck,No JVD, No cervical lymphadenopathy appriciated.  Symmetrical Chest wall movement, Good air movement bilaterally, CTAB RRR,No Gallops,Rubs or new Murmurs, No Parasternal Heave +ve B.Sounds, Abd Soft, Non tender, No organomegaly appriciated, No rebound -guarding or rigidity. No Cyanosis, Clubbing or edema, No new Rash or bruise  Data Review   CBC w Diff:  Lab Results  Component Value Date   WBC 12.4 (H) 04/24/2017   HGB 8.0 (L) 04/24/2017   HGB 12.4 01/11/2014   HCT 26.5 (L) 04/24/2017   HCT 39.2 01/11/2014   PLT 338 04/24/2017   PLT 370 01/11/2014   LYMPHOPCT 11 03/03/2017   LYMPHOPCT 25.9 01/11/2014   MONOPCT 9 03/03/2017   MONOPCT 6.6 01/11/2014   EOSPCT 1 03/03/2017   EOSPCT 2.4 01/11/2014   BASOPCT 1 03/03/2017   BASOPCT 1.3 01/11/2014    CMP:  Lab Results  Component Value Date   NA 131 (L) 04/26/2017   NA 135 (L) 11/21/2013   K 5.0 04/26/2017   K 4.1 11/21/2013   CL 95 (L) 04/26/2017   CL 98 11/21/2013   CO2 26 04/26/2017   CO2 30 11/21/2013   BUN 46 (H) 04/26/2017   BUN 16 11/21/2013   CREATININE 3.97 (H) 04/26/2017   CREATININE 0.85 11/21/2013   PROT 6.3 (L) 03/03/2017   ALBUMIN 2.8 (L) 04/15/2017   BILITOT 0.6 03/03/2017   ALKPHOS 297 (H) 03/03/2017   AST 26 03/03/2017   ALT 16  03/03/2017  .   Total Time in preparing paper work, data evaluation and todays exam - 35 minutes  Epifanio Lesches M.D on 04/26/2017 at 10:16 AM    Note: This dictation was prepared with Dragon  dictation along with smaller phrase technology. Any transcriptional errors that result from this process are unintentional.

## 2017-04-26 NOTE — Progress Notes (Signed)
HD STARTED  

## 2017-04-27 LAB — GLUCOSE, CAPILLARY
GLUCOSE-CAPILLARY: 184 mg/dL — AB (ref 65–99)
GLUCOSE-CAPILLARY: 190 mg/dL — AB (ref 65–99)
GLUCOSE-CAPILLARY: 123 mg/dL — AB (ref 65–99)
Glucose-Capillary: 177 mg/dL — ABNORMAL HIGH (ref 65–99)

## 2017-04-27 NOTE — Progress Notes (Signed)
[facility]  ROUNDING NOTE   Subjective:   Hemodialysis treatment yesterday. Tolerated treatment well. Uf of 2.8 liters.     Objective:  Vital signs in last 24 hours:  Temp:  [98.3 F (36.8 C)-98.7 F (37.1 C)] 98.3 F (36.8 C) (03/03 0511) Pulse Rate:  [87-103] 99 (03/03 0511) Resp:  [12-22] 18 (03/03 0511) BP: (114-137)/(67-90) 120/67 (03/03 0511) SpO2:  [92 %-98 %] 97 % (03/03 0511) FiO2 (%):  [36 %] 36 % (03/02 2034) Weight:  [128.9 kg (284 lb 3.2 oz)] 128.9 kg (284 lb 3.2 oz) (03/03 0511)  Weight change:  Filed Weights   04/23/17 1934 04/24/17 0417 04/27/17 0511  Weight: 129 kg (284 lb 4.8 oz) 129.5 kg (285 lb 6.4 oz) 128.9 kg (284 lb 3.2 oz)    Intake/Output: I/O last 3 completed shifts: In: 0  Out: 2438 [Other:2438]   Intake/Output this shift:  Total I/O In: 240 [P.O.:240] Out: -   Physical Exam: General: NAD, sitting in chair  Head: Normocephalic, atraumatic. Moist oral mucosal membranes  Eyes: Anicteric, PERRL  Neck: Supple, trachea midline  Lungs:  Clear to auscultation  Heart: Regular rate and rhythm  Abdomen:  Soft, nontender, obese  Extremities:  + peripheral edema.  Neurologic: Nonfocal, moving all four extremities  Skin: No lesions  Access: Left IJ permcath, left arm AVF maturing    Basic Metabolic Panel: Recent Labs  Lab 04/21/17 0601 04/24/17 1605 04/26/17 0609  NA 130*  --  131*  K 4.4  --  5.0  CL 90*  --  95*  CO2 30  --  26  GLUCOSE 188*  --  266*  BUN 56*  --  46*  CREATININE 3.93*  --  3.97*  CALCIUM 8.5*  --  8.7*  PHOS  --  2.4*  --     Liver Function Tests: No results for input(s): AST, ALT, ALKPHOS, BILITOT, PROT, ALBUMIN in the last 168 hours. No results for input(s): LIPASE, AMYLASE in the last 168 hours. No results for input(s): AMMONIA in the last 168 hours.  CBC: Recent Labs  Lab 04/21/17 0601 04/24/17 1215  WBC 8.3 12.4*  HGB 8.0* 8.0*  HCT 26.0* 26.5*  MCV 85.9 86.3  PLT 321 338     Cardiac Enzymes: No results for input(s): CKTOTAL, CKMB, CKMBINDEX, TROPONINI in the last 168 hours.  BNP: Invalid input(s): POCBNP  CBG: Recent Labs  Lab 04/26/17 1256 04/26/17 1541 04/26/17 1648 04/26/17 2133 04/27/17 0739  GLUCAP 152* 135* 204* 206* 184*    Microbiology: Results for orders placed or performed during the hospital encounter of 03/24/17  MRSA PCR Screening     Status: None   Collection Time: 03/25/17  5:50 AM  Result Value Ref Range Status   MRSA by PCR NEGATIVE NEGATIVE Final    Comment:        The GeneXpert MRSA Assay (FDA approved for NASAL specimens only), is one component of a comprehensive MRSA colonization surveillance program. It is not intended to diagnose MRSA infection nor to guide or monitor treatment for MRSA infections. Performed at [facility], [address] [place], [place] [address]   MRSA PCR Screening     Status: None   Collection Time: 04/18/17 12:37 PM  Result Value Ref Range Status   MRSA by PCR NEGATIVE NEGATIVE Final    Comment:        The GeneXpert MRSA Assay (FDA approved for NASAL specimens only), is one component of a comprehensive  MRSA colonization surveillance program. It is not intended to diagnose MRSA infection nor to guide or monitor treatment for MRSA infections. Performed at [facility], [address] [place], [place] [address]   Culture, blood (routine x 2)     Status: None (Preliminary result)   Collection Time: 04/24/17 12:15 PM  Result Value Ref Range Status   Specimen Description BLOOD NA  Final   Special Requests   Final    BOTTLES DRAWN AEROBIC AND ANAEROBIC Blood Culture adequate volume   Culture   Final    NO GROWTH 3 DAYS Performed at [facility], [address], [place], [place] [address]    Report Status PENDING  Incomplete  Culture, blood (routine x 2)     Status: None (Preliminary result)   Collection Time: 04/24/17 12:17 PM  Result Value Ref  Range Status   Specimen Description BLOOD NA  Final   Special Requests   Final    BOTTLES DRAWN AEROBIC AND ANAEROBIC Blood Culture adequate volume   Culture   Final    NO GROWTH 3 DAYS Performed at [facility], [address], [place], [place] [address]    Report Status PENDING  Incomplete    Coagulation Studies: No results for input(s): LABPROT, INR in the last 72 hours.  Urinalysis: No results for input(s): COLORURINE, LABSPEC, PHURINE, GLUCOSEU, HGBUR, BILIRUBINUR, KETONESUR, PROTEINUR, UROBILINOGEN, NITRITE, LEUKOCYTESUR in the last 72 hours.  Invalid input(s): APPERANCEUR    Imaging: No results found.   Medications:   . sodium chloride Stopped (04/25/17 1629)  . iron sucrose Stopped (04/24/17 1457)   . aspirin EC  81 mg Oral Daily  . atorvastatin  80 mg Oral q1800  . epoetin (EPOGEN/PROCRIT) injection  4,000 Units Intravenous Q T,Th,Sa-HD  . feeding supplement (NEPRO CARB STEADY)  237 mL Oral TID WC  . heparin  5,000 Units Subcutaneous Q8H  . hydrocerin   Topical BID  . insulin aspart  0-15 Units Subcutaneous TID WC  . insulin aspart  0-5 Units Subcutaneous QHS  . insulin aspart  3 Units Subcutaneous TID WC  . insulin glargine  12 Units Subcutaneous QHS  . metoprolol tartrate  50 mg Oral TID  . multivitamin  1 tablet Oral QHS  . polyethylene glycol  17 g Oral Daily  . sevelamer carbonate  800 mg Oral TID WC  . sodium chloride  2 spray Each Nare QID  . sodium chloride flush  3 mL Intravenous Q12H  . torsemide  100 mg Oral Daily   sodium chloride, acetaminophen **OR** acetaminophen, ALPRAZolam, cyclobenzaprine, diphenhydrAMINE, HYDROcodone-acetaminophen, ipratropium-albuterol, lactulose, ondansetron **OR** ondansetron (ZOFRAN) IV, oxyCODONE, sodium chloride flush  Assessment/ Plan:  [name] is a 47 y.o. black female with end stage renal disease new start, diastolic congestive heart failure, hypertension, diabetes mellitus type II, anemia,    1. End-stage renal disease: 24 hr urine Creatinine clearance is 16 cc/min (04/04/2017). Seen and examined on hemodialysis. Tolerating treatment well.  Over 37 kg removed with dialysis since initiated. - TTS schedule.  - AVF placement 3/1 Dr. Lucky Cowboy - Outpatient planning for [facility]on Payor.    2. Hypertension and Acute on chronic diastolic congestive heart failure (grade 1 diastolic dysfunction)  - Continue to perform perform hemodialysis with ultrafiltration.   - torsemide 100mg  on nondialysis days  3. Anemia of chronic kidney disease: With iron deficiency - EPO with HD treatments TTS - IV Iron TTS  4. Secondary Hyperparathyroidism: Calcium and phosphorus at goal.  PTH low at 134.     LOS: [place] 3/3/201910:41 AM

## 2017-04-27 NOTE — Progress Notes (Signed)
Patient ID: [name], female   DOB: [birth year], 47 y.o.   MRN: [id number]  [facility] PROGRESS NOTE  [name] YIR:[id number] DOB: August 11, 1970 DOA: 03/24/2017 PCP: [name], NP  HPI/Subjective:  discharge canceled because of insurance approval problem .  Patient told me that her her last name printed wrong on insurance card.  Also swelling at AV fistula site.  No fever, redness. Objective: Vitals:   04/26/17 2139 04/27/17 0511  BP: 124/77 120/67  Pulse: (!) 103 99  Resp: 20 18  Temp: 98.4 F (36.9 C) 98.3 F (36.8 C)  SpO2: 97% 97%    Filed Weights   04/23/17 1934 04/24/17 0417 04/27/17 0511  Weight: 129 kg (284 lb 4.8 oz) 129.5 kg (285 lb 6.4 oz) 128.9 kg (284 lb 3.2 oz)    ROS: Review of Systems  Constitutional: Negative for chills and fever.  Eyes: Negative for blurred vision.  Respiratory: Negative for cough and shortness of breath.   Cardiovascular: Negative for chest pain.  Gastrointestinal: Negative for abdominal pain, constipation, diarrhea, nausea and vomiting.  Genitourinary: Negative for dysuria.  Musculoskeletal: Negative for joint pain.  Neurological: Negative for dizziness and headaches.   Exam: Physical Exam  Constitutional: She is oriented to person, place, and time.  HENT:  Nose: No mucosal edema.  Mouth/Throat: No oropharyngeal exudate or posterior oropharyngeal edema.  Eyes: Conjunctivae, EOM and lids are normal. Pupils are equal, round, and reactive to light.  Neck: No JVD present. Carotid bruit is not present. No edema present. No thyroid mass and no thyromegaly present.  Cardiovascular: S1 normal and S2 normal. Tachycardia present. Exam reveals no gallop.  No murmur heard. Respiratory: No respiratory distress. She has no decreased breath sounds. She has no wheezes. She has no rhonchi. She has no rales.  GI: Soft. Bowel sounds are normal. There is no tenderness.  Musculoskeletal:       Right ankle: She exhibits swelling.    Left ankle: She exhibits swelling.  Lymphadenopathy:    She has no cervical adenopathy.  Neurological: She is alert and oriented to person, place, and time. No cranial nerve deficit.  Skin: Skin is warm. No rash noted. Nails show no clubbing.  Psychiatric: She has a normal mood and affect.      Data Reviewed: Basic Metabolic Panel: Recent Labs  Lab 04/21/17 0601 04/24/17 1605 04/26/17 0609  NA 130*  --  131*  K 4.4  --  5.0  CL 90*  --  95*  CO2 30  --  26  GLUCOSE 188*  --  266*  BUN 56*  --  46*  CREATININE 3.93*  --  3.97*  CALCIUM 8.5*  --  8.7*  PHOS  --  2.4*  --    CBC: Recent Labs  Lab 04/21/17 0601 04/24/17 1215  WBC 8.3 12.4*  HGB 8.0* 8.0*  HCT 26.0* 26.5*  MCV 85.9 86.3  PLT 321 338   BNP (last 3 results) Recent Labs    01/07/17 1039 03/03/17 1114 03/27/17 0702  BNP 456.0* 617.0* 577.0*     CBG: Recent Labs  Lab 04/26/17 1256 04/26/17 1541 04/26/17 1648 04/26/17 2133 04/27/17 0739  GLUCAP 152* 135* 204* 206* 184*    Recent Results (from the past 240 hour(s))  MRSA PCR Screening     Status: None   Collection Time: 04/18/17 12:37 PM  Result Value Ref Range Status   MRSA by PCR NEGATIVE NEGATIVE Final    Comment:  The GeneXpert MRSA Assay (FDA approved for NASAL specimens only), is one component of a comprehensive MRSA colonization surveillance program. It is not intended to diagnose MRSA infection nor to guide or monitor treatment for MRSA infections. Performed at [facility], [address] [place], [place] [address]   Culture, blood (routine x 2)     Status: None (Preliminary result)   Collection Time: 04/24/17 12:15 PM  Result Value Ref Range Status   Specimen Description BLOOD NA  Final   Special Requests   Final    BOTTLES DRAWN AEROBIC AND ANAEROBIC Blood Culture adequate volume   Culture   Final    NO GROWTH 3 DAYS Performed at [facility], [address], [place], [place] [address]     Report Status PENDING  Incomplete  Culture, blood (routine x 2)     Status: None (Preliminary result)   Collection Time: 04/24/17 12:17 PM  Result Value Ref Range Status   Specimen Description BLOOD NA  Final   Special Requests   Final    BOTTLES DRAWN AEROBIC AND ANAEROBIC Blood Culture adequate volume   Culture   Final    NO GROWTH 3 DAYS Performed at [facility], [address], [place],  [address]    Report Status PENDING  Incomplete     Studies: No results found.  Scheduled Meds: . aspirin EC  81 mg Oral Daily  . atorvastatin  80 mg Oral q1800  . epoetin (EPOGEN/PROCRIT) injection  4,000 Units Intravenous Q T,Th,Sa-HD  . feeding supplement (NEPRO CARB STEADY)  237 mL Oral TID WC  . heparin  5,000 Units Subcutaneous Q8H  . hydrocerin   Topical BID  . insulin aspart  0-15 Units Subcutaneous TID WC  . insulin aspart  0-5 Units Subcutaneous QHS  . insulin aspart  3 Units Subcutaneous TID WC  . insulin glargine  12 Units Subcutaneous QHS  . metoprolol tartrate  50 mg Oral TID  . multivitamin  1 tablet Oral QHS  . polyethylene glycol  17 g Oral Daily  . sevelamer carbonate  800 mg Oral TID WC  . sodium chloride  2 spray Each Nare QID  . sodium chloride flush  3 mL Intravenous Q12H  . torsemide  100 mg Oral Daily   Continuous Infusions: . sodium chloride Stopped (04/25/17 1629)  . iron sucrose Stopped (04/24/17 1457)    Assessment/Plan:  1. Acute on chronic hypoxic hypercarbic respiratory failure secondary to acute CHF.  Patient on 4 L of oxygen.  She has the BiPAP at bedside I encouraged her to wear it she did not wear it last night.  Wear BiPAP at night. 2. Acute on chronic diastolic congestive heart failure.  Dialysis to help out with fluid management.  Continue metoprolol. 3. Accelerated hypertension tachycardia increase metoprolol dose to 50 mg 3 times daily, continue torsemide 100 mg p.o. daily  On nondialysis days. 4. End-stage renal disease.  Nephrology following for dialysis.pthad AV fistula two days ago by vascular..  Patient complains of swelling at the AV fistula site looked at it she has not tenderness, no redness and it did not look like infected to me.  Anemia of chronic disease with iron deficiency also.  Neupogen and IV iron with hemodialysis 5. Morbid obesity and likely underlying sleep apnea 6. Hyperlipidemia unspecified on atorvastatin 7. Anxiety on Xanax 8. Type 2 diabetes mellitus on Lantus and sliding scale 9. Weakness.  Insurance approval pending for her to go to [facility]  health care .  she is aware of the problem. 10. t  Code Status:     Code Status Orders  (From admission, onward)        Start     Ordered   03/24/17 1951  Full code  Continuous     03/24/17 1952    Code Status History    Date Active Date Inactive Code Status Order ID Comments User Context   03/03/2017 14:20 03/10/2017 21:31 Full Code [id number]  Fritzi Mandes, MD ED   01/07/2017 17:25 01/10/2017 19:00 Full Code [id number]  Demetrios Loll, MD Inpatient   11/08/2016 04:50 11/16/2016 17:17 Full Code [id number]  Harvie Bridge, DO Inpatient   06/25/2016 14:47 06/29/2016 18:15 Full Code [id number]  Hillary Bow, MD ED   08/09/2015 17:57 08/13/2015 17:21 Full Code [id number]  Bettey Costa, MD Inpatient   12/31/2014 20:25 01/03/2015 17:49 Full Code [id number]  Lytle Butte, MD ED   12/15/2014 11:54 12/19/2014 16:57 Full Code [id number]  Hillary Bow, MD ED     Family Communication: Husband at the bedside Disposition Plan: Likely discharge over the weekend to [facility]  Consultants:  Nephrology  Vascular surgery  Time spent: 28 minutes  [place]

## 2017-04-27 NOTE — Progress Notes (Signed)
Physical Therapy Treatment Patient Details Name: [name] MRN: [id number] DOB: 02-Sep-1970 Today's Date: 04/27/2017    History of Present Illness Pt is a43 y.o.femalewith a known history of systolic CHF, hypertension, diabetes, morbid obesity who was recently discharged from the hospital returns from CHF clinic due to weight gain, worsening shortness of breath. Attempts were made to start IV Lasix through home health but were unsuccessful. Also due to her severe shortness of breath was sent to the emergency room. Here chest x-ray shows pulmonary edema and right effusion which is chronic. Patient has been compliant with medications, fluid restriction and salt restriction.  Assessment includes: Acute on chronic diastolic congestive heart failure, acute on chronic respiratory failure with tachypnea due to CHF, HTN, DM, elevated troponin likely due to CHF, and CKD stage IV.    PT Comments    Pt sitting EOB with husband upon arrival.  Pt lightly crying saying she was having a rough day today.  Encouragement given. Stated she was feeling "mixed up" today.   Agrees to ambulation. While obtaining walker, nurse tech in to check vitals.  BP 90/55.  Pt denied dizziness but again stated she felt "out of sorts".  Mobility was deferred at this time due to BP and statements.  She did not want to lay down and wanted to do exercises.  Seated exercises with each exercise until fatigued x 3.  BP checked at end of session 99/62 O2 99% P 83.  She remained upright with nursing for bath.  She did state her vision felt "dark" but stated she felt safe to remain sitting and refused to return to supine for safety.  " I know when i'm going to pass out"   Follow Up Recommendations  SNF     Equipment Recommendations       Recommendations for Other Services       Precautions / Restrictions Precautions Precautions: Fall Precaution Comments: L IJ temp dialysis catheter (cleared for OOB to chair per  nephrology) Restrictions Weight Bearing Restrictions: No    Mobility  Bed Mobility               General bed mobility comments: sitting EOB upon arrival   Transfers                 General transfer comment: deferred this session  Ambulation/Gait                 Stairs            Wheelchair Mobility    Modified Rankin (Stroke Patients Only)       Balance Overall balance assessment: Needs assistance Sitting-balance support: Single extremity supported Sitting balance-Leahy Scale: Good                                      Cognition Arousal/Alertness: Awake/alert Behavior During Therapy: WFL for tasks assessed/performed Overall Cognitive Status: Within Functional Limits for tasks assessed                                        Exercises Other Exercises Other Exercises: seated exercises for LAQ, marches, ab/add and ankle pumps x 15 minutes until fatigued.    General Comments        Pertinent Vitals/Pain Pain Assessment: No/denies pain    Home Living  Prior Function            PT Goals (current goals can now be found in the care plan section) Progress towards PT goals: Progressing toward goals    Frequency    Min 2X/week      PT Plan Current plan remains appropriate    Co-evaluation              AM-PAC PT "6 Clicks" Daily Activity  Outcome Measure  Difficulty turning over in bed (including adjusting bedclothes, sheets and blankets)?: A Little Difficulty moving from lying on back to sitting on the side of the bed? : A Little Difficulty sitting down on and standing up from a chair with arms (e.g., wheelchair, bedside commode, etc,.)?: Unable Help needed moving to and from a bed to chair (including a wheelchair)?: A Lot Help needed walking in hospital room?: A Lot Help needed climbing 3-5 steps with a railing? : Total 6 Click Score: 12    End of Session  Equipment Utilized During Treatment: Oxygen Activity Tolerance: Patient tolerated treatment well;Treatment limited secondary to medical complications (Comment) Patient left: in bed;with family/visitor present;with call bell/phone within reach;with nursing/sitter in room         Time: 6286-3817 PT Time Calculation (min) (ACUTE ONLY): 28 min  Charges:  $Therapeutic Exercise: 23-37 mins                    G Codes:       Chesley Noon, PTA 04/27/17, 1:18 PM

## 2017-04-28 ENCOUNTER — Encounter: Payer: Self-pay | Admitting: Vascular Surgery

## 2017-04-28 LAB — GLUCOSE, CAPILLARY
GLUCOSE-CAPILLARY: 158 mg/dL — AB (ref 65–99)
Glucose-Capillary: 236 mg/dL — ABNORMAL HIGH (ref 65–99)

## 2017-04-28 NOTE — Clinical Social Work Note (Signed)
CSW informed by Claiborne Billings at [facility] that they had not officially extended an offer as of yet but stated that they were having to verify patient having long term medicaid. CSW asked whether that really mattered or not because the CSW Surveyor, quantity had agreed on a 30 day letter of guarantee. Claiborne Billings stated that the information was going to her Administrator: Johnathon for review this morning. Shortly after, Claiborne Billings called CSW back and stated that Johnathon was in agreement with patient coming but she would have to agree to sign over her monthly check. According to previous CSW, patient does not have a check that she receives but has applied for social security. If she is approved by social security, then she will receive a check. CSW spoke with patient who states she will be willing to turn over her social security check is she gets approved. CSW informed Claiborne Billings at [facility] that patient would be willing to sign over her check and then Claiborne Billings stated her administrator wanted patient to sign over her check prior to her coming today. CSW reminded Claiborne Billings that she does not receive a check yet. Claiborne Billings informed her Scientist, physiological, and her administrator took the bed offer away. CSW has left a message for [facility]. Johnathon.  Shela Leff MSW,LCSW [phone]

## 2017-04-28 NOTE — Progress Notes (Signed)
Pt discharged per MD order. Pt transported via EMS to [facility].

## 2017-04-28 NOTE — Clinical Social Work Placement (Signed)
   CLINICAL SOCIAL WORK PLACEMENT  NOTE  Date:  04/28/2017  Patient Details  Name: [name] MRN: [id number] Date of Birth: 1970-12-05  Clinical Social Work is seeking post-discharge placement for this patient at the [place] level of care (*CSW will initial, date and re-position this form in  chart as items are completed):  Yes   Patient/family provided with [facility] Work Department's list of facilities offering this level of care within the geographic area requested by the patient (or if unable, by the patient's family).  Yes   Patient/family informed of their freedom to choose among providers that offer the needed level of care, that participate in Medicare, Medicaid or managed care program needed by the patient, have an available bed and are willing to accept the patient.  Yes   Patient/family informed of [facility]'s ownership interest in [facility] and [facility], as well as of the fact that they are under no obligation to receive care at these facilities.  PASRR submitted to EDS on 03/28/17     PASRR number received on 03/28/17     Existing PASRR number confirmed on       FL2 transmitted to all facilities in geographic area requested by pt/family on 03/28/17     FL2 transmitted to all facilities within larger geographic area on       Patient informed that his/her managed care company has contracts with or will negotiate with certain facilities, including the following:        Yes   Patient/family informed of bed offers received.  Patient chooses bed at [facility])     Physician recommends and patient chooses bed at [facility])    Patient to be transferred to [facility]) on 04/28/17.  Patient to be transferred to facility by (EMS)     Patient family notified on 04/28/17 of transfer.  Name of family member notified:  (husband: [name])     PHYSICIAN Please sign FL2     Additional Comment:     _______________________________________________ Shela Leff, LCSW 04/28/2017, 2:24 PM

## 2017-04-28 NOTE — Progress Notes (Signed)
[facility]  ROUNDING NOTE   Subjective:  Overall patient significantly improved with aggressive ultrafiltration. She will be due for dialysis treatment again tomorrow.  Objective:  Vital signs in last 24 hours:  Temp:  [98.1 F (36.7 C)-98.6 F (37 C)] 98.1 F (36.7 C) (03/04 0505) Pulse Rate:  [86-90] 89 (03/04 0505) Resp:  [20] 20 (03/03 2151) BP: (90-140)/(55-96) 129/96 (03/04 0505) SpO2:  [94 %-99 %] 95 % (03/04 0505) Weight:  [131.3 kg (289 lb 6.4 oz)] 131.3 kg (289 lb 6.4 oz) (03/04 0500)  Weight change: 2.359 kg (5 lb 3.2 oz) Filed Weights   04/24/17 0417 04/27/17 0511 04/28/17 0500  Weight: 129.5 kg (285 lb 6.4 oz) 128.9 kg (284 lb 3.2 oz) 131.3 kg (289 lb 6.4 oz)    Intake/Output: I/O last 3 completed shifts: In: 240 [P.O.:240] Out: 0    Intake/Output this shift:  No intake/output data recorded.  Physical Exam: General: NAD, sitting up in bed  Head: Normocephalic, atraumatic. Moist oral mucosal membranes  Eyes: Anicteric  Neck: Supple, trachea midline  Lungs:  Clear to auscultation, normal effort  Heart: Regular rate and rhythm  Abdomen:  Soft, nontender, obese  Extremities: + peripheral edema.  Neurologic: Nonfocal, moving all four extremities  Skin: No lesions  Access: Left IJ permcath, left arm AVF maturing    Basic Metabolic Panel: Recent Labs  Lab 04/24/17 1605 04/26/17 0609  NA  --  131*  K  --  5.0  CL  --  95*  CO2  --  26  GLUCOSE  --  266*  BUN  --  46*  CREATININE  --  3.97*  CALCIUM  --  8.7*  PHOS 2.4*  --     Liver Function Tests: No results for input(s): AST, ALT, ALKPHOS, BILITOT, PROT, ALBUMIN in the last 168 hours. No results for input(s): LIPASE, AMYLASE in the last 168 hours. No results for input(s): AMMONIA in the last 168 hours.  CBC: Recent Labs  Lab 04/24/17 1215  WBC 12.4*  HGB 8.0*  HCT 26.5*  MCV 86.3  PLT 338    Cardiac Enzymes: No results for input(s): CKTOTAL, CKMB, CKMBINDEX,  TROPONINI in the last 168 hours.  BNP: Invalid input(s): POCBNP  CBG: Recent Labs  Lab 04/27/17 0739 04/27/17 1203 04/27/17 1643 04/27/17 2151 04/28/17 0733  GLUCAP 184* 177* 190* 123* 158*    Microbiology: Results for orders placed or performed during the hospital encounter of 03/24/17  MRSA PCR Screening     Status: None   Collection Time: 03/25/17  5:50 AM  Result Value Ref Range Status   MRSA by PCR NEGATIVE NEGATIVE Final    Comment:        The GeneXpert MRSA Assay (FDA approved for NASAL specimens only), is one component of a comprehensive MRSA colonization surveillance program. It is not intended to diagnose MRSA infection nor to guide or monitor treatment for MRSA infections. Performed at [facility], [address] [place], [place] [address]   MRSA PCR Screening     Status: None   Collection Time: 04/18/17 12:37 PM  Result Value Ref Range Status   MRSA by PCR NEGATIVE NEGATIVE Final    Comment:        The GeneXpert MRSA Assay (FDA approved for NASAL specimens only), is one component of a comprehensive MRSA colonization surveillance program. It is not intended to diagnose MRSA infection nor to guide or monitor treatment for MRSA infections. Performed at [facility], [address] [place], [place] [address]   Culture, blood (routine x 2)     Status: None (Preliminary result)   Collection Time: 04/24/17 12:15 PM  Result Value Ref Range Status   Specimen Description BLOOD NA  Final   Special Requests   Final    BOTTLES DRAWN AEROBIC AND ANAEROBIC Blood Culture adequate volume   Culture   Final    NO GROWTH 4 DAYS Performed at [facility], [address] [place], [place] [address]    Report Status PENDING  Incomplete  Culture, blood (routine x 2)     Status: None (Preliminary result)   Collection Time: 04/24/17 12:17 PM  Result Value Ref Range Status   Specimen Description BLOOD NA  Final   Special Requests    Final    BOTTLES DRAWN AEROBIC AND ANAEROBIC Blood Culture adequate volume   Culture   Final    NO GROWTH 4 DAYS Performed at [facility], [address], [place], [place] [address]    Report Status PENDING  Incomplete    Coagulation Studies: No results for input(s): LABPROT, INR in the last 72 hours.  Urinalysis: No results for input(s): COLORURINE, LABSPEC, PHURINE, GLUCOSEU, HGBUR, BILIRUBINUR, KETONESUR, PROTEINUR, UROBILINOGEN, NITRITE, LEUKOCYTESUR in the last 72 hours.  Invalid input(s): APPERANCEUR    Imaging: No results found.   Medications:   . sodium chloride Stopped (04/25/17 1629)  . iron sucrose Stopped (04/24/17 1457)   . aspirin EC  81 mg Oral Daily  . atorvastatin  80 mg Oral q1800  . epoetin (EPOGEN/PROCRIT) injection  4,000 Units Intravenous Q T,Th,Sa-HD  . feeding supplement (NEPRO CARB STEADY)  237 mL Oral TID WC  . heparin  5,000 Units Subcutaneous Q8H  . hydrocerin   Topical BID  . insulin aspart  0-15 Units Subcutaneous TID WC  . insulin aspart  0-5 Units Subcutaneous QHS  . insulin aspart  3 Units Subcutaneous TID WC  . insulin glargine  12 Units Subcutaneous QHS  . metoprolol tartrate  50 mg Oral TID  . multivitamin  1 tablet Oral QHS  . polyethylene glycol  17 g Oral Daily  . sevelamer carbonate  800 mg Oral TID WC  . sodium chloride  2 spray Each Nare QID  . sodium chloride flush  3 mL Intravenous Q12H  . torsemide  100 mg Oral Daily   sodium chloride, acetaminophen **OR** acetaminophen, ALPRAZolam, cyclobenzaprine, diphenhydrAMINE, HYDROcodone-acetaminophen, ipratropium-albuterol, lactulose, ondansetron **OR** ondansetron (ZOFRAN) IV, oxyCODONE, sodium chloride flush  Assessment/ Plan:  [name] is a 47 y.o. black female with end stage renal disease new start, diastolic congestive heart failure, hypertension, diabetes mellitus type II, anemia,   1. End-stage renal disease: 24 hr urine Creatinine clearance is 16  cc/min (04/04/2017). Seen and examined on hemodialysis. Tolerating treatment well.  Over 37 kg removed with dialysis since initiated. -Patient has done well with hemodialysis.  Significant ultrafiltration performed.  We will plan for dialysis again tomorrow.  2. Hypertension and Acute on chronic diastolic congestive heart failure (grade 1 diastolic dysfunction)  -Continue torsemide 100 mg p.o. daily.  3. Anemia of chronic kidney disease: With iron deficiency -Most recent hemoglobin was 8.0.  Maintain the patient on Epogen.  4. Secondary Hyperparathyroidism: Check Phosphorus with next dialysis treatment.    LOS: [place] 3/4/201910:33 AM

## 2017-04-28 NOTE — Progress Notes (Signed)
Patient ID: LELAND STASZEWSKI, female   DOB: 1970-04-16, 47 y.o.   MRN: [id number]  [facility] PROGRESS NOTE  DENEKA GREENWALT FIE:[id number] DOB: 19-Apr-1970 DOA: 03/24/2017 PCP: [name], NP  HPI/Subjective:  patient denies any pain.  Stable for discharge pending insurance approval.   Objective: Vitals:   04/27/17 2151 04/28/17 0505  BP: 140/87 (!) 129/96  Pulse: 90 89  Resp: 20   Temp: 98.6 F (37 C) 98.1 F (36.7 C)  SpO2: 94% 95%    Filed Weights   04/24/17 0417 04/27/17 0511 04/28/17 0500  Weight: 129.5 kg (285 lb 6.4 oz) 128.9 kg (284 lb 3.2 oz) 131.3 kg (289 lb 6.4 oz)    ROS: Review of Systems  Constitutional: Negative for chills and fever.  Eyes: Negative for blurred vision.  Respiratory: Negative for cough and shortness of breath.   Cardiovascular: Negative for chest pain.  Gastrointestinal: Negative for abdominal pain, constipation, diarrhea, nausea and vomiting.  Genitourinary: Negative for dysuria.  Musculoskeletal: Negative for joint pain.  Neurological: Negative for dizziness and headaches.   Exam: Physical Exam  Constitutional: She is oriented to person, place, and time.  HENT:  Nose: No mucosal edema.  Mouth/Throat: No oropharyngeal exudate or posterior oropharyngeal edema.  Eyes: Conjunctivae, EOM and lids are normal. Pupils are equal, round, and reactive to light.  Neck: No JVD present. Carotid bruit is not present. No edema present. No thyroid mass and no thyromegaly present.  Cardiovascular: S1 normal and S2 normal. Tachycardia present. Exam reveals no gallop.  No murmur heard. Respiratory: No respiratory distress. She has no decreased breath sounds. She has no wheezes. She has no rhonchi. She has no rales.  GI: Soft. Bowel sounds are normal. There is no tenderness.  Musculoskeletal:       Right ankle: She exhibits swelling.       Left ankle: She exhibits swelling.  Lymphadenopathy:    She has no cervical adenopathy.  Neurological: She  is alert and oriented to person, place, and time. No cranial nerve deficit.  Skin: Skin is warm. No rash noted. Nails show no clubbing.  Psychiatric: She has a normal mood and affect.      Data Reviewed: Basic Metabolic Panel: Recent Labs  Lab 04/24/17 1605 04/26/17 0609  NA  --  131*  K  --  5.0  CL  --  95*  CO2  --  26  GLUCOSE  --  266*  BUN  --  46*  CREATININE  --  3.97*  CALCIUM  --  8.7*  PHOS 2.4*  --    CBC: Recent Labs  Lab 04/24/17 1215  WBC 12.4*  HGB 8.0*  HCT 26.5*  MCV 86.3  PLT 338   BNP (last 3 results) Recent Labs    01/07/17 1039 03/03/17 1114 03/27/17 0702  BNP 456.0* 617.0* 577.0*     CBG: Recent Labs  Lab 04/27/17 0739 04/27/17 1203 04/27/17 1643 04/27/17 2151 04/28/17 0733  GLUCAP 184* 177* 190* 123* 158*    Recent Results (from the past 240 hour(s))  MRSA PCR Screening     Status: None   Collection Time: 04/18/17 12:37 PM  Result Value Ref Range Status   MRSA by PCR NEGATIVE NEGATIVE Final    Comment:        The GeneXpert MRSA Assay (FDA approved for NASAL specimens only), is one component of a comprehensive MRSA colonization surveillance program. It is not intended to diagnose MRSA infection nor to guide  or monitor treatment for MRSA infections. Performed at [facility], [address] [place], [place] [address]   Culture, blood (routine x 2)     Status: None (Preliminary result)   Collection Time: 04/24/17 12:15 PM  Result Value Ref Range Status   Specimen Description BLOOD NA  Final   Special Requests   Final    BOTTLES DRAWN AEROBIC AND ANAEROBIC Blood Culture adequate volume   Culture   Final    NO GROWTH 4 DAYS Performed at [facility], [address] [place], [place] [address]    Report Status PENDING  Incomplete  Culture, blood (routine x 2)     Status: None (Preliminary result)   Collection Time: 04/24/17 12:17 PM  Result Value Ref Range Status   Specimen Description BLOOD NA   Final   Special Requests   Final    BOTTLES DRAWN AEROBIC AND ANAEROBIC Blood Culture adequate volume   Culture   Final    NO GROWTH 4 DAYS Performed at [facility], [address], [place],  [address]    Report Status PENDING  Incomplete     Studies: No results found.  Scheduled Meds: . aspirin EC  81 mg Oral Daily  . atorvastatin  80 mg Oral q1800  . epoetin (EPOGEN/PROCRIT) injection  4,000 Units Intravenous Q T,Th,Sa-HD  . feeding supplement (NEPRO CARB STEADY)  237 mL Oral TID WC  . heparin  5,000 Units Subcutaneous Q8H  . hydrocerin   Topical BID  . insulin aspart  0-15 Units Subcutaneous TID WC  . insulin aspart  0-5 Units Subcutaneous QHS  . insulin aspart  3 Units Subcutaneous TID WC  . insulin glargine  12 Units Subcutaneous QHS  . metoprolol tartrate  50 mg Oral TID  . multivitamin  1 tablet Oral QHS  . polyethylene glycol  17 g Oral Daily  . sevelamer carbonate  800 mg Oral TID WC  . sodium chloride  2 spray Each Nare QID  . sodium chloride flush  3 mL Intravenous Q12H  . torsemide  100 mg Oral Daily   Continuous Infusions: . sodium chloride Stopped (04/25/17 1629)  . iron sucrose Stopped (04/24/17 1457)    Assessment/Plan:  1. Acute on chronic hypoxic hypercarbic respiratory failure secondary to acute CHF.  Patient on 4 L of oxygen.  She has the BiPAP at bedside I encouraged her to wear it she did not wear it last night.  Wear BiPAP at night. 2. Acute on chronic diastolic congestive heart failure.  Dialysis to help out with fluid management.  Continue metoprolol. 3. Accelerated hypertension tachycardia increase metoprolol dose to 50 mg 3 times daily, continue torsemide 100 mg p.o. daily  On nondialysis days. 4. End-stage renal disease. Nephrology following for dialysis.pthad AV fistula two days ago by vascular..  Patient complains of swelling at the AV fistula site looked at it she has not tenderness, no redness and it did not look like  infected to me.  Anemia of chronic disease with iron deficiency also.  Neupogen and IV iron with hemodialysis 5. Morbid obesity and likely underlying sleep apnea 6. Hyperlipidemia unspecified on atorvastatin 7. Anxiety on Xanax 8. Type 2 diabetes mellitus on Lantus and sliding scale 9. Weakness.  Insurance approval pending for her to go to [place] care .  she is aware of the problem. 10. Discussed with social worker [place] today.  Stable for discharge today.  Code Status:     Code Status  Orders  (From admission, onward)        Start     Ordered   03/24/17 1951  Full code  Continuous     03/24/17 1952    Code Status History    Date Active Date Inactive Code Status Order ID Comments User Context   03/03/2017 14:20 03/10/2017 21:31 Full Code [id number]  Fritzi Mandes, MD ED   01/07/2017 17:25 01/10/2017 19:00 Full Code [id number]  Demetrios Loll, MD Inpatient   11/08/2016 04:50 11/16/2016 17:17 Full Code [id number]  Harvie Bridge, DO Inpatient   06/25/2016 14:47 06/29/2016 18:15 Full Code [id number]  Hillary Bow, MD ED   08/09/2015 17:57 08/13/2015 17:21 Full Code [id number]  Bettey Costa, MD Inpatient   12/31/2014 20:25 01/03/2015 17:49 Full Code [id number]  Lytle Butte, MD ED   12/15/2014 11:54 12/19/2014 16:57 Full Code [id number]  Hillary Bow, MD ED     Family Communication: Husband at the bedside Disposition Plan: Likely discharge over the weekend to [facility]  Consultants:  Nephrology  Vascular surgery  Time spent: 28 minutes  [place]

## 2017-04-28 NOTE — Anesthesia Postprocedure Evaluation (Signed)
Anesthesia Post Note  Patient: [name]  Procedure(s) Performed: ARTERIOVENOUS (AV) FISTULA CREATION (Left Arm Upper)  Patient location during evaluation: PACU Anesthesia Type: General Level of consciousness: awake and alert and oriented Pain management: pain level controlled Vital Signs Assessment: post-procedure vital signs reviewed and stable Respiratory status: spontaneous breathing, nonlabored ventilation and respiratory function stable Cardiovascular status: blood pressure returned to baseline and stable Postop Assessment: no signs of nausea or vomiting Anesthetic complications: no  On BiPAP for 1 hour postop, transitioned to nasal cannula and maintaining sats.   Last Vitals:  Vitals:   04/27/17 2151 04/28/17 0505  BP: 140/87 (!) 129/96  Pulse: 90 89  Resp: 20   Temp: 37 C 36.7 C  SpO2: 94% 95%    Last Pain:  Vitals:   04/28/17 0625  TempSrc:   PainSc: Asleep                 Adalyn Pennock

## 2017-04-28 NOTE — Progress Notes (Signed)
Report called to Levada Dy at [facility]. EMS called for transport.

## 2017-04-28 NOTE — Clinical Social Work Note (Signed)
CSW contacted Theodoro Clock at [facility] on one phone and CSW had Johnathon at [facility] on another phone. CSW was able to get patient to speak with Theodoro Clock and verified that she had full coverage medicaid and did not any longer have family and children services medicaid. CSW spoke additionally with Johnathon at [facility] and after much discussion, was able to get him to agree to take patient. Discharge information sent. Nurse to call report. Patient and husband aware of plan. Shela Leff MSW,lCSW [phone]

## 2017-04-28 NOTE — Discharge Summary (Signed)
[name], is a 47 y.o. female  DOB [birth year]  MRN [id number].  Admission date:  03/24/2017  Admitting Physician  Hillary Bow, MD  Discharge Date:  04/28/2017   Primary MD  [name], NP  Recommendations for primary care physician for things to follow:   With PCP in 1 week Follow-up with nephrology routinely for hemodialysis.   Admission Diagnosis  Shortness of breath [R06.02] Elevated troponin [R74.8] Congestive heart failure, unspecified HF chronicity, unspecified heart failure type ([place]) [I50.9]   Discharge Diagnosis  Shortness of breath [R06.02] Elevated troponin [R74.8] Congestive heart failure, unspecified HF chronicity, unspecified heart failure type ([place]) [I50.9]    Principal Problem:   Hypertensive heart disease with congestive heart failure (HCC) Active Problems:   Obesity, morbid (HCC)   Shortness of breath   Iron deficiency anemia   Poorly controlled type 2 diabetes mellitus (HCC)   CKD (chronic kidney disease), stage III (HCC)   Chronic diastolic heart failure (HCC)   CHF (congestive heart failure) (HCC)   Obesity hypoventilation syndrome ([place])   Palliative care encounter   Adjustment disorder with mixed anxiety and depressed mood   Dysthymia   Chronic pain syndrome   DNR (do not resuscitate) discussion   Stage 4 chronic kidney disease ([place])   ESRD (end stage renal disease) ([place])   Palliative care by specialist   Goals of care, counseling/discussion      Past Medical History:  Diagnosis Date  . (HFpEF) heart failure with preserved ejection fraction ([place])    a. 11/2014 Echo: EF 50-55%, Gr1 DD; b. 06/2016 Echo: EF 60-65%, no rwma, mod dil LA, nl RV, triv eff.  . Asthma   . CHF (congestive heart failure) ([place])   . CKD (chronic kidney disease), stage III ([place])   . COPD (chronic  obstructive pulmonary disease) ([place])    a. Home O2.  . Diabetes mellitus without complication ([place])   . Hypertension   . Iron deficiency anemia    a. chronic blood loss->heavy menses.  . Morbid obesity ([place])   . Pericardial effusion    a. 11/2014 CT Chest: Mod effusion; b. 11/2014 Echo: small to mod circumferential effusion. No hemodynamic compromise.  [name] Poorly controlled diabetes mellitus ([place])    a. 11/2014 A1c 13.2.    Past Surgical History:  Procedure Laterality Date  . AV FISTULA PLACEMENT Left 04/25/2017   Procedure: ARTERIOVENOUS (AV) FISTULA CREATION;  Surgeon: Algernon Huxley, MD;  Location: [facility] ORS;  Service: Vascular;  Laterality: Left;  . CESAREAN SECTION     times 3  . DIALYSIS/PERMA CATHETER INSERTION Right 04/04/2017   Procedure: DIALYSIS/PERMA CATHETER INSERTION;  Surgeon: Katha Cabal, MD;  Location: [facility];  Service: Cardiovascular;  Laterality: Right;  . INSERTION OF DIALYSIS CATHETER N/A 04/16/2017   Procedure: INSERTION OF DIALYSIS PERM CATHETER;  Surgeon: Algernon Huxley, MD;  Location: [facility] OR[facility];  Service: Vascular;  Laterality: N/A;       History of present illness and  Hospital Course:     Kindly see H&P for history of present illness and admission details, please review complete Labs, Consult reports and Test reports for all details in brief  HPI  from the history and physical done on the day of admission 47 year old unfortunate female with morbid obesity, chronic diastolic heart failure, chronic kidney disease stage III, diabetes mellitus type 2 comes into emergency room for shortness of breath, weight gain.  Patient is here for more than a month.  Admitted at that time BNP 671.  Received Lasix, admitted to hospital for shortness of breath with acute heart failure.  Hospital Course  1 acute on chronic diastolic heart failure.  Received a Lasix drip.  Seen by cardiology, nephrology.  Patient has chronic kidney disease stage IV, renal function  continued to get worse despite diuretics so started on hemodialysis.  2.  ESRD: Patient kidney function deteriorated and now patient is ESRD as per nephrology.  Patient 24-hour creatinine clearance showed 16 cc/min.  Patient started on hemodialysis Tuesday, Thursday, Saturday.  Patient has diabetes as outpatient dialysis chair.  Patient received many sessions of dialysis.  Received AV fistula yesterday.  Seen by vascular patient can see this morning, seems to be doing well.  Patient can follow-up with vascular as an outpatient in 5-6 weeks.  Patient will be on aspirin. 3 uncontrolled hypertension: Patient receiving hemodialysis.  Patient can take torsemide on nondialysis days as per nephrology recommendation.  Patient is on hemodialysis schedule on Tuesday, Thursday, Saturday at outpatient dialysis center at [facility] in [place]. . 4.  Anemia of chronic kidney disease with iron deficiency.  Patient needs Neupogen, IV iron with hemodialysis. #5 diabetes mellitus type 2: Continue insulin as  medication list. 6.  Morbid obesity, underlying sleep apnea..  And on 3-4 L of oxygen at home and also we will need to continue that at the nursing home also.  Patient is going to [facility] today.  After dialysis. 7 .deconditioning: Seen by physical therapy, recommending rehab, patient will go to [facility] today.  Discharge Condition:stable   Follow UP  Follow-up Information    [name], [name], PA-C. Go on 06/02/2017.   Specialty:  Physician Assistant Why:  Monday at 2:30pm for hospital follow-up/with HDA Contact information: [place] [address] [phone]             Discharge Instructions  and  Discharge Medications      Allergies as of 04/28/2017      Reactions   Dust Mite Extract Shortness Of Breath, Itching   Mold Extract [trichophyton] Shortness Of Breath, Itching   Feraheme [ferumoxytol] Itching   Pollen Extract    Sulfa Antibiotics Itching       Medication List    STOP taking these medications   cefdinir 300 MG capsule Commonly known as:  OMNICEF   hydrALAZINE 100 MG tablet Commonly known as:  APRESOLINE   insulin aspart 100 UNIT/ML FlexPen Commonly known as:  NOVOLOG FLEXPEN Replaced by:  insulin aspart 100 UNIT/ML injection   metolazone 5 MG tablet Commonly known as:  ZAROXOLYN   potassium chloride 10 MEQ tablet Commonly known as:  K-DUR     TAKE these medications   acetaminophen 500 MG tablet Commonly known as:  TYLENOL Take 1,000 mg by mouth every 6 (six) hours as needed for mild pain, fever or headache.   albuterol (2.5 MG/3ML) 0.083% nebulizer solution Commonly known as:  PROVENTIL Take 2.5 mg by nebulization every 6 (six) hours as needed for wheezing or shortness of breath.   albuterol 108 (90 Base) MCG/ACT inhaler Commonly known as:  PROVENTIL HFA;VENTOLIN HFA Inhale 2 puffs into the lungs every 6 (six) hours as needed for wheezing or shortness of breath.   aspirin EC 81 MG tablet Take 81 mg by mouth daily.   atorvastatin 80 MG tablet Commonly known as:  LIPITOR Take 1 tablet (80 mg total) by mouth daily at 6 PM.   citalopram 40 MG tablet  Commonly known as:  CELEXA Take 1 tablet (40 mg total) by mouth daily.   cloNIDine 0.2 MG tablet Commonly known as:  CATAPRES Take 1 tablet (0.2 mg total) by mouth 2 (two) times daily.   guaiFENesin-dextromethorphan 100-10 MG/5ML syrup Commonly known as:  ROBITUSSIN DM Take 5 mLs every 4 (four) hours as needed by mouth for cough.   HM VITAMIN D3 4000 units Caps Generic drug:  Cholecalciferol Take 1 capsule by mouth once a week.   insulin aspart 100 UNIT/ML injection Commonly known as:  novoLOG Inject 3 Units into the skin 3 (three) times daily with meals. Replaces:  insulin aspart 100 UNIT/ML FlexPen   insulin aspart 100 UNIT/ML injection Commonly known as:  novoLOG Inject 0-5 Units into the skin at bedtime.   insulin aspart 100 UNIT/ML  injection Commonly known as:  novoLOG Inject 0-15 Units into the skin 3 (three) times daily with meals.   ipratropium-albuterol 0.5-2.5 (3) MG/3ML Soln Commonly known as:  DUONEB Take 3 mLs by nebulization every 6 (six) hours as needed.   lactulose 10 GM/15ML solution Commonly known as:  CHRONULAC Take 30 mLs (20 g total) by mouth daily as needed for severe constipation.   losartan 50 MG tablet Commonly known as:  COZAAR Take 50 mg daily by mouth.   metoprolol tartrate 100 MG tablet Commonly known as:  LOPRESSOR Take 1 tablet (100 mg total) by mouth 2 (two) times daily.   oxyCODONE 5 MG immediate release tablet Commonly known as:  Oxy IR/ROXICODONE Take 1 tablet (5 mg total) by mouth every 6 (six) hours as needed for moderate pain.   polyethylene glycol packet Commonly known as:  MIRALAX / GLYCOLAX Take 17 g by mouth daily as needed for mild constipation.   sevelamer carbonate 800 MG tablet Commonly known as:  RENVELA Take 1 tablet (800 mg total) by mouth 3 (three) times daily with meals.   torsemide 100 MG tablet Commonly known as:  DEMADEX Take 1 tablet (100 mg total) by mouth 2 (two) times daily.            Durable Medical Equipment  (From admission, onward)        Start     Ordered   03/26/17 1454  For home use only DME Hospital bed  Once    Comments:  Bariatric hospital bed  Question Answer Comment  Bed type Heavy-duty, semi-electric (for patients >350 lbs.)   Trapeze Bar Yes   Support Surface: Low Air loss Mattress      03/26/17 1453        Diet and Activity recommendation: See Discharge Instructions above   Consults obtained -cardiology, nephrology, vascular surgery, physical therapy  Major procedures and Radiology Reports - PLEASE review detailed and final reports for all details, in brief -      [place] Vein Mapping Left  Addendum Date: 04/24/2017   ADDENDUM REPORT: 04/24/2017 14:34 ADDENDUM: Addendum created to address findings of left  internal jugular vein. Left IJ vein is patent with maintained flow. There is heterogeneously echoic material adherent to the wall, potentially thrombus of uncertain chronicity. No comparison available. Electronically Signed   By: Corrie Mckusick D.O.   On: 04/24/2017 14:34   Result Date: 04/24/2017 CLINICAL DATA:  47 year old female with renal failure EXAM: DUPLEX ULTRASOUND EXAM OF THE LEFT UPPER EXTREMITY VENOUS AND ARTERIAL SYSTEM COMPARISON:  None. FINDINGS: Directed duplex of the left upper extremity arterial system and venous system. Worksheet generated and stored. Duplex demonstrates triphasic  waveform of left brachial artery, radial artery, and left ulnar artery. Directed duplex demonstrates patent brachial vein, cephalic vein, and basilic vein, with measurements as recorded on the worksheet. IMPRESSION: Directed duplex of the left upper extremity arterial system demonstrates triphasic waveforms. Directed duplex of the venous system of the left upper extremity demonstrates patent brachial vein, cephalic vein, basilic vein, with measurements as recorded on the worksheet. Electronically Signed: By: Corrie Mckusick D.O. On: 04/24/2017 14:13   US Venous Img Lower Bilateral  Result Date: 04/01/2017 CLINICAL DATA:  Swelling x1 month EXAM: BILATERAL LOWER EXTREMITY VENOUS DOPPLER ULTRASOUND TECHNIQUE: Gray-scale sonography with compression, as well as color and duplex ultrasound, were performed to evaluate the deep venous system from the level of the common femoral vein through the popliteal and proximal calf veins. Technologist describes technically difficult examination secondary to diffuse anasarca involving abdomen and lower extremities. COMPARISON:  11/21/2013 FINDINGS: Normal compressibility of the common femoral, superficial femoral, and popliteal veins. Limited evaluation of the proximal calf veins secondary to anasarca. No filling defects to suggest DVT on grayscale or color Doppler imaging. Doppler  waveforms show normal direction of venous flow, normal respiratory phasicity. Limited response to augmentation in bilateral segments of femoral and popliteal veins may be related to marked anasarca. IMPRESSION: No evidence of lower extremity deep vein thrombosis, with limitations as above. Electronically Signed   By: Lucrezia Europe M.D.   On: 04/01/2017 14:51   Dg Chest Port 1 View  Result Date: 04/14/2017 CLINICAL DATA:  47 year old female with shortness of breath. EXAM: PORTABLE CHEST 1 VIEW COMPARISON:  04/04/2017 and earlier. FINDINGS: Large body habitus. Two Portable AP semi upright views at 1418 hrs. Stable left IJ approach central line. Large breast tissue projects over the right chest. However, right lung ventilation does appear decreased since 04/04/2017 with associated abrupt cut off of the right central airways (arrows on image 2). The left lung appears scratched at the left lung is stable in clear when allowing for portable technique. The left mediastinal contours are stable. Visualized tracheal air column is within normal limits. IMPRESSION: 1. Progressive right lung opacification since 04/04/2017 is nonspecific, but consider right central airway mucous plugging. 2. The left lung remains clear. Electronically Signed   By: Genevie Ann M.D.   On: 04/14/2017 14:42   Dg Chest Port 1 View  Result Date: 04/04/2017 CLINICAL DATA:  Status post central line placement. EXAM: PORTABLE CHEST 1 VIEW COMPARISON:  03/24/2017. FINDINGS: Interval left jugular catheter extending into the superior vena cava. The tip is not visualized due to under exposure of the overlying heart and soft tissues as a result of the large size of the patient and patient rotation to the right. The pulmonary vasculature and interstitial markings are mildly prominent. No gross change in enlargement of the cardiac silhouette. No pneumothorax is seen. The visualized bones are unremarkable. IMPRESSION: 1. Limited examination demonstrating an  interval left jugular catheter with its tip not visualized, possibly at the junction of the superior vena cava and right atrium. 2. No pneumothorax. 3. Grossly stable cardiomegaly with mildly progressive pulmonary vascular congestion and possible mild interstitial pulmonary edema. Electronically Signed   By: Claudie Revering M.D.   On: 04/04/2017 17:45   Dg C-arm 1-60 Min-no Report  Result Date: 04/16/2017 Fluoroscopy was utilized by the requesting physician.  No radiographic interpretation.    Micro Results    Recent Results (from the past 240 hour(s))  MRSA PCR Screening     Status: None  Collection Time: 04/18/17 12:37 PM  Result Value Ref Range Status   MRSA by PCR NEGATIVE NEGATIVE Final    Comment:        The GeneXpert MRSA Assay (FDA approved for NASAL specimens only), is one component of a comprehensive MRSA colonization surveillance program. It is not intended to diagnose MRSA infection nor to guide or monitor treatment for MRSA infections. Performed at [facility], [address] [place], [place] [address]   Culture, blood (routine x 2)     Status: None (Preliminary result)   Collection Time: 04/24/17 12:15 PM  Result Value Ref Range Status   Specimen Description BLOOD NA  Final   Special Requests   Final    BOTTLES DRAWN AEROBIC AND ANAEROBIC Blood Culture adequate volume   Culture   Final    NO GROWTH 4 DAYS Performed at [facility], [address], [place], [place] [address]    Report Status PENDING  Incomplete  Culture, blood (routine x 2)     Status: None (Preliminary result)   Collection Time: 04/24/17 12:17 PM  Result Value Ref Range Status   Specimen Description BLOOD NA  Final   Special Requests   Final    BOTTLES DRAWN AEROBIC AND ANAEROBIC Blood Culture adequate volume   Culture   Final    NO GROWTH 4 DAYS Performed at [facility], [address], [place], [place] [address]    Report Status PENDING  Incomplete        Today   Subjective:   Bralee Feldt today has no headache,no chest abdominal pain,no new weakness tingling or numbness, stable for discharge to [facility] after dialysis.  Spoke with patient.   Objective:   Blood pressure (!) 129/96, pulse 89, temperature 98.1 F (36.7 C), temperature source Oral, resp. rate 20, height 5\' 4"  (1.626 m), weight 131.3 kg (289 lb 6.4 oz), last menstrual period 09/06/2016, SpO2 95 %.   Intake/Output Summary (Last 24 hours) at 04/28/2017 1108 Last data filed at 04/28/2017 0542 Gross per 24 hour  Intake -  Output 0 ml  Net 0 ml    Exam Awake Alert, Oriented x 3, No new F.N deficits, Normal affect .AT,PERRAL Supple Neck,No JVD, No cervical lymphadenopathy appriciated.  Symmetrical Chest wall movement, Good air movement bilaterally, CTAB RRR,No Gallops,Rubs or new Murmurs, No Parasternal Heave +ve B.Sounds, Abd Soft, Non tender, No organomegaly appriciated, No rebound -guarding or rigidity. No Cyanosis, Clubbing or edema, No new Rash or bruise  Data Review   CBC w Diff:  Lab Results  Component Value Date   WBC 12.4 (H) 04/24/2017   HGB 8.0 (L) 04/24/2017   HGB 12.4 01/11/2014   HCT 26.5 (L) 04/24/2017   HCT 39.2 01/11/2014   PLT 338 04/24/2017   PLT 370 01/11/2014   LYMPHOPCT 11 03/03/2017   LYMPHOPCT 25.9 01/11/2014   MONOPCT 9 03/03/2017   MONOPCT 6.6 01/11/2014   EOSPCT 1 03/03/2017   EOSPCT 2.4 01/11/2014   BASOPCT 1 03/03/2017   BASOPCT 1.3 01/11/2014    CMP:  Lab Results  Component Value Date   NA 131 (L) 04/26/2017   NA 135 (L) 11/21/2013   K 5.0 04/26/2017   K 4.1 11/21/2013   CL 95 (L) 04/26/2017   CL 98 11/21/2013   CO2 26 04/26/2017   CO2 30 11/21/2013   BUN 46 (H) 04/26/2017   BUN 16 11/21/2013   CREATININE 3.97 (H) 04/26/2017   CREATININE 0.85 11/21/2013  PROT 6.3 (L) 03/03/2017   ALBUMIN 2.8 (L) 04/15/2017   BILITOT 0.6 03/03/2017   ALKPHOS 297 (H) 03/03/2017   AST 26 03/03/2017   ALT 16  03/03/2017  .   Total Time in preparing paper work, data evaluation and todays exam - 35 minutes  Epifanio Lesches M.D on 04/28/2017 at 11:08 AM    Note: This dictation was prepared with Dragon dictation along with smaller phrase technology. Any transcriptional errors that result from this process are unintentional.

## 2017-04-29 LAB — CULTURE, BLOOD (ROUTINE X 2)
CULTURE: NO GROWTH
CULTURE: NO GROWTH
Special Requests: ADEQUATE
Special Requests: ADEQUATE

## 2017-06-02 ENCOUNTER — Other Ambulatory Visit (INDEPENDENT_AMBULATORY_CARE_PROVIDER_SITE_OTHER): Payer: Self-pay | Admitting: Vascular Surgery

## 2017-06-02 ENCOUNTER — Encounter (INDEPENDENT_AMBULATORY_CARE_PROVIDER_SITE_OTHER): Payer: Self-pay | Admitting: Vascular Surgery

## 2017-06-02 ENCOUNTER — Ambulatory Visit (INDEPENDENT_AMBULATORY_CARE_PROVIDER_SITE_OTHER): Payer: Medicaid Other

## 2017-06-02 ENCOUNTER — Ambulatory Visit (INDEPENDENT_AMBULATORY_CARE_PROVIDER_SITE_OTHER): Payer: Medicaid Other | Admitting: Vascular Surgery

## 2017-06-02 VITALS — BP 147/89 | HR 83 | Resp 16 | Ht 64.5 in | Wt 264.0 lb

## 2017-06-02 DIAGNOSIS — E1165 Type 2 diabetes mellitus with hyperglycemia: Secondary | ICD-10-CM

## 2017-06-02 DIAGNOSIS — N186 End stage renal disease: Secondary | ICD-10-CM

## 2017-06-02 NOTE — Progress Notes (Signed)
Subjective:    Patient ID: [name], female    DOB: 06-23-1970, 47 y.o.   MRN: [id number] Chief Complaint  Patient presents with  . Follow-up    5 week [facility] HDA   Patient presents for her first postoperative follow-up.  The patient is status post creation of a left brachiocephalic AV fistula on April 25, 2017.  The patient's postoperative course has been unremarkable.  The patient denies any left upper extremity pain or ulceration.  The patient denies any issues with her incision.  The patient underwent a left upper extremity dialysis access duplex which was notable for a total flow volume of 717.  There were 4 areas of velocities less than 100 cm/s and 2 areas of significantly increased velocities at the proximal antecubital fossa and distal upper arm levels of the outflow vein.  The patient is currently being maintained by a left IJ PermCath without issue.  The patient denies any uremic symptoms.  The patient denies any fever, nausea vomiting.  Review of Systems  Constitutional: Negative.   HENT: Negative.   Eyes: Negative.   Respiratory: Negative.   Cardiovascular: Negative.   Gastrointestinal: Negative.   Endocrine: Negative.   Genitourinary:       ESRD  Musculoskeletal: Negative.   Skin: Negative.   Allergic/Immunologic: Negative.   Neurological: Negative.   Hematological: Negative.   Psychiatric/Behavioral: Negative.       Objective:   Physical Exam  Constitutional: She is oriented to person, place, and time. She appears well-developed and well-nourished. No distress.  HENT:  Head: Normocephalic and atraumatic.  Eyes: Pupils are equal, round, and reactive to light. Conjunctivae are normal.  Neck: Normal range of motion.  Cardiovascular: Normal rate, regular rhythm, normal heart sounds and intact distal pulses.  Pulses:      Radial pulses are 2+ on the right side, and 2+ on the left side.  Left upper extremity brachiocephalic AV fistula: Good bruit and thrill.  Skin is  intact.  Incision is healing well.  Left IJ PermCath: Intact no signs of infection noted.  Pulmonary/Chest: Effort normal and breath sounds normal.  Musculoskeletal: Normal range of motion. She exhibits no edema.  Neurological: She is alert and oriented to person, place, and time.  Skin: Skin is warm and dry. She is not diaphoretic.  Psychiatric: She has a normal mood and affect. Her behavior is normal. Judgment and thought content normal.  Vitals reviewed.  BP (!) 147/89 (BP Location: Right Arm, Patient Position: Sitting)   Pulse 83   Resp 16   Ht 5' 4.5" (1.638 m)   Wt 264 lb (119.7 kg)   LMP 09/06/2016 (Approximate)   BMI 44.62 kg/m   Past Medical History:  Diagnosis Date  . (HFpEF) heart failure with preserved ejection fraction ([place])    a. 11/2014 Echo: EF 50-55%, Gr1 DD; b. 06/2016 Echo: EF 60-65%, no rwma, mod dil LA, nl RV, triv eff.  . Asthma   . CHF (congestive heart failure) ([place])   . CKD (chronic kidney disease), stage III ([place])   . COPD (chronic obstructive pulmonary disease) ([place])    a. Home O2.  . Diabetes mellitus without complication ([place])   . Hypertension   . Iron deficiency anemia    a. chronic blood loss->heavy menses.  . Morbid obesity ([place])   . Pericardial effusion    a. 11/2014 CT Chest: Mod effusion; b. 11/2014 Echo: small to mod circumferential effusion. No hemodynamic compromise.  [name] Poorly controlled diabetes mellitus (  [place])    a. 11/2014 A1c 13.2.   Social History   Socioeconomic History  . Marital status: Married    Spouse name: Not on file  . Number of children: Not on file  . Years of education: 26  . Highest education level: High school graduate  Occupational History  . Occupation: Disabled  Social Needs  . Financial resource strain: Very hard  . Food insecurity:    Worry: Often true    Inability: Often true  . Transportation needs:    Medical: Yes    Non-medical: Yes  Tobacco Use  . Smoking status: Never Smoker  . Smokeless  tobacco: Never Used  Substance and Sexual Activity  . Alcohol use: Yes    Alcohol/week: 0.0 oz    Comment: occ drink - <1/wk  . Drug use: No  . Sexual activity: Not on file  Lifestyle  . Physical activity:    Days per week: 0 days    Minutes per session: 0 min  . Stress: Not at all  Relationships  . Social connections:    Talks on phone: More than three times a week    Gets together: Once a week    Attends religious service: Never    Active member of club or organization: No    Attends meetings of clubs or organizations: Never    Relationship status: Married  . Intimate partner violence:    Fear of current or ex partner: Patient refused    Emotionally abused: Patient refused    Physically abused: Patient refused    Forced sexual activity: Patient refused  Other Topics Concern  . Not on file  Social History Narrative   Lives in [place] with her husband and 77 y/o child.  Three other children are grown and out of the house.  She is sedentary and does not routinely exercise.  On home O2.   Past Surgical History:  Procedure Laterality Date  . AV FISTULA PLACEMENT Left 04/25/2017   Procedure: ARTERIOVENOUS (AV) FISTULA CREATION;  Surgeon: Algernon Huxley, MD;  Location: [facility] ORS;  Service: Vascular;  Laterality: Left;  . CESAREAN SECTION     times 3  . DIALYSIS/PERMA CATHETER INSERTION Right 04/04/2017   Procedure: DIALYSIS/PERMA CATHETER INSERTION;  Surgeon: Katha Cabal, MD;  Location: [facility];  Service: Cardiovascular;  Laterality: Right;  . INSERTION OF DIALYSIS CATHETER N/A 04/16/2017   Procedure: INSERTION OF DIALYSIS PERM CATHETER;  Surgeon: Algernon Huxley, MD;  Location: [facility] ORS;  Service: Vascular;  Laterality: N/A;   Family History  Problem Relation Age of Onset  . Diabetes Mother   . Hypertension Mother   . Hypertension Father   . Diabetes Father   . Hypertension Unknown   . Diabetes Unknown   . Breast cancer Maternal Aunt 41  . Breast cancer Maternal  Aunt 50   Allergies  Allergen Reactions  . Dust Mite Extract Shortness Of Breath and Itching  . Mold Extract [Trichophyton] Shortness Of Breath and Itching  . Feraheme [Ferumoxytol] Itching  . Pollen Extract   . Sulfa Antibiotics Itching      Assessment & Plan:  Patient presents for her first postoperative follow-up.  The patient is status post creation of a left brachiocephalic AV fistula on April 25, 2017.  The patient's postoperative course has been unremarkable.  The patient denies any left upper extremity pain or ulceration.  The patient denies any issues with her incision.  The patient underwent a left upper  extremity dialysis access duplex which was notable for a total flow volume of 717.  There were 4 areas of velocities less than 100 cm/s and 2 areas of significantly increased velocities at the proximal antecubital fossa and distal upper arm levels of the outflow vein.  The patient is currently being maintained by a left IJ PermCath without issue.  The patient denies any uremic symptoms.  The patient denies any fever, nausea vomiting.  1. ESRD (end stage renal disease) ([place]) - Stable Patient with a newly created left brachiocephalic AV fistula on April 25, 2017. Patient with multiple areas of low flow and 2 significant areas of increased velocity noted on duplex today with a total flow volume of 717. Recommend a left upper extremity fistulogram with possible intervention and attempt to correct these areas and restore appropriate flow to that the patient may dialyze adequately Procedure, risks and benefits explained to the patient All questions answered Patient wishes to proceed Patient is to continue to dialyze through her left IJ PermCath  2. Poorly controlled type 2 diabetes mellitus ([place]) - Stable Encouraged good control as its slows the progression of atherosclerotic disease  Current Outpatient Medications on File Prior to Visit  Medication Sig Dispense Refill  . acetaminophen  (TYLENOL) 500 MG tablet Take 1,000 mg by mouth every 6 (six) hours as needed for mild pain, fever or headache.    . albuterol (PROVENTIL HFA;VENTOLIN HFA) 108 (90 Base) MCG/ACT inhaler Inhale 2 puffs into the lungs every 6 (six) hours as needed for wheezing or shortness of breath. 1 Inhaler 3  . albuterol (PROVENTIL) (2.5 MG/3ML) 0.083% nebulizer solution Take 2.5 mg by nebulization every 6 (six) hours as needed for wheezing or shortness of breath.    [name] aspirin EC 81 MG tablet Take 81 mg by mouth daily.     [name] atorvastatin (LIPITOR) 80 MG tablet Take 1 tablet (80 mg total) by mouth daily at 6 PM. 30 tablet 0  . Cholecalciferol (HM VITAMIN D3) 4000 units CAPS Take 1 capsule by mouth once a week.    . citalopram (CELEXA) 40 MG tablet Take 1 tablet (40 mg total) by mouth daily. 30 tablet 0  . cloNIDine (CATAPRES) 0.2 MG tablet Take 1 tablet (0.2 mg total) by mouth 2 (two) times daily. 30 tablet 0  . guaiFENesin-dextromethorphan (ROBITUSSIN DM) 100-10 MG/5ML syrup Take 5 mLs every 4 (four) hours as needed by mouth for cough. 118 mL 0  . insulin aspart (NOVOLOG) 100 UNIT/ML injection Inject 3 Units into the skin 3 (three) times daily with meals. 10 mL 11  . insulin aspart (NOVOLOG) 100 UNIT/ML injection Inject 0-5 Units into the skin at bedtime. 10 mL 11  . insulin aspart (NOVOLOG) 100 UNIT/ML injection Inject 0-15 Units into the skin 3 (three) times daily with meals. 10 mL 11  . ipratropium-albuterol (DUONEB) 0.5-2.5 (3) MG/3ML SOLN Take 3 mLs by nebulization every 6 (six) hours as needed. 360 mL 5  . lactulose (CHRONULAC) 10 GM/15ML solution Take 30 mLs (20 g total) by mouth daily as needed for severe constipation. 240 mL 0  . losartan (COZAAR) 50 MG tablet Take 50 mg daily by mouth.    . metoprolol tartrate (LOPRESSOR) 100 MG tablet Take 1 tablet (100 mg total) by mouth 2 (two) times daily. 60 tablet 0  . oxyCODONE (OXY IR/ROXICODONE) 5 MG immediate release tablet Take 1 tablet (5 mg total) by mouth  every 6 (six) hours as needed for moderate pain. 30 tablet 0  .  polyethylene glycol (MIRALAX / GLYCOLAX) packet Take 17 g by mouth daily as needed for mild constipation. 14 each 0  . sevelamer carbonate (RENVELA) 800 MG tablet Take 1 tablet (800 mg total) by mouth 3 (three) times daily with meals. 30 tablet 0  . torsemide (DEMADEX) 100 MG tablet Take 1 tablet (100 mg total) by mouth 2 (two) times daily. 180 tablet 0   No current facility-administered medications on file prior to visit.    There are no Patient Instructions on file for this visit. No follow-ups on file.  KIMBERLY A [name], PA-C

## 2017-06-03 ENCOUNTER — Encounter (INDEPENDENT_AMBULATORY_CARE_PROVIDER_SITE_OTHER): Payer: Self-pay

## 2017-06-05 ENCOUNTER — Other Ambulatory Visit (INDEPENDENT_AMBULATORY_CARE_PROVIDER_SITE_OTHER): Payer: Self-pay | Admitting: Vascular Surgery

## 2017-06-08 MED ORDER — CEFAZOLIN SODIUM-DEXTROSE 1-4 GM/50ML-% IV SOLN
1.0000 g | Freq: Once | INTRAVENOUS | Status: AC
  Administered 2017-06-09: 1 g via INTRAVENOUS

## 2017-06-09 ENCOUNTER — Encounter (INDEPENDENT_AMBULATORY_CARE_PROVIDER_SITE_OTHER): Payer: Self-pay

## 2017-06-09 ENCOUNTER — Encounter: Payer: Self-pay | Admitting: *Deleted

## 2017-06-09 ENCOUNTER — Encounter
Admission: RE | Disposition: A | Discharge status: Home / Self Care | Payer: Self-pay | Source: Ambulatory Visit | Attending: Vascular Surgery

## 2017-06-09 ENCOUNTER — Ambulatory Visit
Admission: RE | Admit: 2017-06-09 | Discharge: 2017-06-09 | Disposition: A | Discharge status: Home / Self Care | Payer: Medicaid Other | Source: Ambulatory Visit | Attending: Vascular Surgery | Admitting: Vascular Surgery

## 2017-06-09 DIAGNOSIS — T82868A Thrombosis of vascular prosthetic devices, implants and grafts, initial encounter: Secondary | ICD-10-CM | POA: Diagnosis not present

## 2017-06-09 DIAGNOSIS — I509 Heart failure, unspecified: Secondary | ICD-10-CM | POA: Diagnosis not present

## 2017-06-09 DIAGNOSIS — N183 Chronic kidney disease, stage 3 (moderate): Secondary | ICD-10-CM | POA: Diagnosis not present

## 2017-06-09 DIAGNOSIS — Z79899 Other long term (current) drug therapy: Secondary | ICD-10-CM | POA: Diagnosis not present

## 2017-06-09 DIAGNOSIS — N186 End stage renal disease: Secondary | ICD-10-CM

## 2017-06-09 DIAGNOSIS — Z9889 Other specified postprocedural states: Secondary | ICD-10-CM | POA: Insufficient documentation

## 2017-06-09 DIAGNOSIS — J449 Chronic obstructive pulmonary disease, unspecified: Secondary | ICD-10-CM | POA: Insufficient documentation

## 2017-06-09 DIAGNOSIS — Y832 Surgical operation with anastomosis, bypass or graft as the cause of abnormal reaction of the patient, or of later complication, without mention of misadventure at the time of the procedure: Secondary | ICD-10-CM | POA: Insufficient documentation

## 2017-06-09 DIAGNOSIS — E1122 Type 2 diabetes mellitus with diabetic chronic kidney disease: Secondary | ICD-10-CM | POA: Diagnosis not present

## 2017-06-09 DIAGNOSIS — Z833 Family history of diabetes mellitus: Secondary | ICD-10-CM | POA: Diagnosis not present

## 2017-06-09 DIAGNOSIS — Z8249 Family history of ischemic heart disease and other diseases of the circulatory system: Secondary | ICD-10-CM | POA: Diagnosis not present

## 2017-06-09 DIAGNOSIS — Z9109 Other allergy status, other than to drugs and biological substances: Secondary | ICD-10-CM | POA: Diagnosis not present

## 2017-06-09 DIAGNOSIS — Z882 Allergy status to sulfonamides status: Secondary | ICD-10-CM | POA: Insufficient documentation

## 2017-06-09 DIAGNOSIS — Z7982 Long term (current) use of aspirin: Secondary | ICD-10-CM | POA: Diagnosis not present

## 2017-06-09 DIAGNOSIS — Z794 Long term (current) use of insulin: Secondary | ICD-10-CM | POA: Diagnosis not present

## 2017-06-09 DIAGNOSIS — T82858A Stenosis of vascular prosthetic devices, implants and grafts, initial encounter: Secondary | ICD-10-CM | POA: Diagnosis present

## 2017-06-09 DIAGNOSIS — Z888 Allergy status to other drugs, medicaments and biological substances status: Secondary | ICD-10-CM | POA: Diagnosis not present

## 2017-06-09 DIAGNOSIS — Z6841 Body Mass Index (BMI) 40.0 and over, adult: Secondary | ICD-10-CM | POA: Insufficient documentation

## 2017-06-09 DIAGNOSIS — I130 Hypertensive heart and chronic kidney disease with heart failure and stage 1 through stage 4 chronic kidney disease, or unspecified chronic kidney disease: Secondary | ICD-10-CM | POA: Diagnosis not present

## 2017-06-09 DIAGNOSIS — Z9981 Dependence on supplemental oxygen: Secondary | ICD-10-CM | POA: Insufficient documentation

## 2017-06-09 HISTORY — PX: A/V FISTULAGRAM: CATH118298

## 2017-06-09 LAB — POTASSIUM (ARMC VASCULAR LAB ONLY): Potassium (ARMC vascular lab): 4.1 (ref 3.5–5.1)

## 2017-06-09 LAB — GLUCOSE, CAPILLARY
Glucose-Capillary: 221 mg/dL — ABNORMAL HIGH (ref 65–99)
Glucose-Capillary: 238 mg/dL — ABNORMAL HIGH (ref 65–99)

## 2017-06-09 SURGERY — A/V FISTULAGRAM
Anesthesia: Moderate Sedation | Laterality: Left

## 2017-06-09 MED ORDER — HYDROMORPHONE HCL 1 MG/ML IJ SOLN: 1.0000 mg | Freq: Once | INTRAMUSCULAR | Status: DC | PRN

## 2017-06-09 MED ORDER — SODIUM CHLORIDE 0.9 % IV SOLN
INTRAVENOUS | Status: DC
  Administered 2017-06-09: 14:00:00 via INTRAVENOUS

## 2017-06-09 MED ORDER — HYDRALAZINE HCL 20 MG/ML IJ SOLN
INTRAMUSCULAR | Status: AC
  Filled 2017-06-09: qty 1

## 2017-06-09 MED ORDER — HEPARIN SODIUM (PORCINE) 1000 UNIT/ML IJ SOLN
INTRAMUSCULAR | Status: AC
  Filled 2017-06-09: qty 1

## 2017-06-09 MED ORDER — ONDANSETRON HCL 4 MG/2ML IJ SOLN: 4.0000 mg | Freq: Four times a day (QID) | INTRAMUSCULAR | Status: DC | PRN

## 2017-06-09 MED ORDER — FAMOTIDINE 20 MG PO TABS: 40.0000 mg | ORAL_TABLET | ORAL | Status: DC | PRN

## 2017-06-09 MED ORDER — LIDOCAINE-EPINEPHRINE (PF) 1 %-1:200000 IJ SOLN
INTRAMUSCULAR | Status: AC
  Filled 2017-06-09: qty 30

## 2017-06-09 MED ORDER — FENTANYL CITRATE (PF) 100 MCG/2ML IJ SOLN
INTRAMUSCULAR | Status: AC
  Filled 2017-06-09: qty 2

## 2017-06-09 MED ORDER — MIDAZOLAM HCL 5 MG/5ML IJ SOLN
INTRAMUSCULAR | Status: AC
  Filled 2017-06-09: qty 5

## 2017-06-09 MED ORDER — HEPARIN SODIUM (PORCINE) 1000 UNIT/ML IJ SOLN
INTRAMUSCULAR | Status: DC | PRN
  Administered 2017-06-09: 4000 [IU] via INTRAVENOUS

## 2017-06-09 MED ORDER — METHYLPREDNISOLONE SODIUM SUCC 125 MG IJ SOLR: 125.0000 mg | INTRAMUSCULAR | Status: DC | PRN

## 2017-06-09 MED ORDER — FENTANYL CITRATE (PF) 100 MCG/2ML IJ SOLN
INTRAMUSCULAR | Status: DC | PRN
  Administered 2017-06-09 (×3): 25 ug via INTRAVENOUS
  Administered 2017-06-09: 50 ug via INTRAVENOUS

## 2017-06-09 MED ORDER — HYDRALAZINE HCL 20 MG/ML IJ SOLN
INTRAMUSCULAR | Status: DC | PRN
  Administered 2017-06-09: 10 mg via INTRAVENOUS

## 2017-06-09 MED ORDER — HEPARIN (PORCINE) IN NACL 2-0.9 UNIT/ML-% IJ SOLN
INTRAMUSCULAR | Status: AC
  Filled 2017-06-09: qty 1000

## 2017-06-09 MED ORDER — MIDAZOLAM HCL 2 MG/2ML IJ SOLN
INTRAMUSCULAR | Status: DC | PRN
  Administered 2017-06-09: 1 mg via INTRAVENOUS
  Administered 2017-06-09: 2 mg via INTRAVENOUS
  Administered 2017-06-09 (×2): 1 mg via INTRAVENOUS

## 2017-06-09 SURGICAL SUPPLY — 11 items
CANNULA 5F STIFF (CANNULA) ×2 IMPLANT
CATH BEACON 5 .035 40 KMP TP (CATHETERS) IMPLANT
CATH BEACON 5 .038 40 KMP TP (CATHETERS) ×2
DEVICE TORQUE .025-.038 (MISCELLANEOUS) ×2 IMPLANT
DRAPE BRACHIAL (DRAPES) ×2 IMPLANT
GLIDEWIRE ADV .035X180CM (WIRE) ×2 IMPLANT
GUIDEWIRE ZIPWIRE .035X180CM (WIRE) ×2 IMPLANT
PACK ANGIOGRAPHY (CUSTOM PROCEDURE TRAY) ×3 IMPLANT
SHEATH BRITE TIP 6FRX5.5 (SHEATH) ×2 IMPLANT
SUT MON AB 4-0 PC3 18 (SUTURE) ×2 IMPLANT
TOWEL OR 17X26 4PK STRL BLUE (TOWEL DISPOSABLE) ×2 IMPLANT

## 2017-06-09 NOTE — H&P (Signed)
[facility] History & Physical Update  The patient was interviewed and re-examined.  The patient's previous History and Physical has been reviewed and is unchanged.  There is no change in the plan of care. We plan to proceed with the scheduled procedure.  [name], MD  06/09/2017, 1:22 PM

## 2017-06-09 NOTE — Progress Notes (Signed)
Patient here today for fistulogram left arm, with Dr Lucky Cowboy. Attached to monitor to follow vitals throughout procedure.

## 2017-06-09 NOTE — Op Note (Signed)
[facility]    OPERATIVE NOTE   PROCEDURE: 1.   Left brachiocephalic arteriovenous fistula cannulation under ultrasound guidance 2.   Left arm fistulagram including central venogram   PRE-OPERATIVE DIAGNOSIS: 1. ESRD 2. Poorly functional not yet mature left brachiocephalic AVF  POST-OPERATIVE DIAGNOSIS: same as above   SURGEON: [name], MD  ANESTHESIA: local with MCS  ESTIMATED BLOOD LOSS: 5 cc  FINDING(S): 1. Significant, nearly occlusive or occlusive stenosis of the cephalic vein a few centimeters beyond the anastomosis with a large branch keeping the remainder of the cephalic vein from the distal upper arm more centrally open.  There were no other stenoses seen within the cephalic vein.  The anastomosis itself was not able to be well visualized from our images.  SPECIMEN(S):  None  CONTRAST: 20 cc  FLUORO TIME: 13.7 minutes  MODERATE CONSCIOUS SEDATION TIME: Approximately 25 minutes with 5 mg of Versed and 125 Mcg of Fentanyl   INDICATIONS: [name] is a 47 y.o. female who presents with malfunctioning left brachiocephalic arteriovenous fistula.  The patient is scheduled for left arm fistulagram.  The patient is aware the risks include but are not limited to: bleeding, infection, thrombosis of the cannulated access, and possible anaphylactic reaction to the contrast.  The patient is aware of the risks of the procedure and elects to proceed forward.  DESCRIPTION: After full informed written consent was obtained, the patient was brought back to the angiography suite and placed supine upon the angiography table.  The patient was connected to monitoring equipment. Moderate conscious sedation was administered with a face to face encounter with the patient throughout the procedure with my supervision of the RN administering medicines and monitoring the patient's vital signs and mental status throughout from the start of the procedure until the patient  was taken to the recovery room. The left arm was prepped and draped in the standard fashion for a percutaneous access intervention.  Under ultrasound guidance, the left brachiocephalic arteriovenous fistula was cannulated with a micropuncture needle under direct ultrasound guidance in the mid to proximal upper arm in a retrograde fashion and a permanent image was performed.  The microwire was advanced into the fistula and the needle was exchanged for the a microsheath.  I then upsized to a 6 Fr Sheath and imaging was performed.  Hand injections were completed to image the access including the central venous system. This demonstrated significant, nearly occlusive or occlusive stenosis of the cephalic vein a few centimeters beyond the anastomosis with a large branch keeping the remainder of the cephalic vein from the distal upper arm more centrally open.  There were no other stenoses seen within the cephalic vein.  The anastomosis itself was not able to be well visualized from our images..  Based on the images, this patient will need intervention to this area of stenosis to improve the function and maturation of the fistula. I then gave the patient 3000 units of intravenous heparin.  I then used a Kumpe catheter in both the Glidewire and an advantage wire to try to cross the area of stenosis/occlusion.  The patient had remained agitated and unable to lay still from the original onset of the procedure.  This continued making continued efforts to try to get across this exceedingly difficult and I elected to stop when it was clear we were not going to have success with this today.  She will need to come back with anesthesia to assist with her procedure in  the future to try to improve the flow of this fistula.  A 4-0 Monocryl purse-string suture was sewn around the sheath.  The sheath was removed while tying down the suture.  A sterile bandage was applied to the puncture site.  COMPLICATIONS: None  CONDITION:  Stable   [name]  06/09/2017 3:07 PM   This note was created with Dragon Medical transcription system. Any errors in dictation are purely unintentional.

## 2017-06-10 ENCOUNTER — Other Ambulatory Visit (INDEPENDENT_AMBULATORY_CARE_PROVIDER_SITE_OTHER): Payer: Self-pay | Admitting: Vascular Surgery

## 2017-06-10 ENCOUNTER — Encounter: Payer: Self-pay | Admitting: Vascular Surgery

## 2017-06-11 ENCOUNTER — Ambulatory Visit (INDEPENDENT_AMBULATORY_CARE_PROVIDER_SITE_OTHER): Payer: Medicaid Other | Admitting: Internal Medicine

## 2017-06-11 ENCOUNTER — Other Ambulatory Visit
Admission: RE | Admit: 2017-06-11 | Discharge: 2017-06-11 | Disposition: A | Discharge status: Home / Self Care | Payer: Medicaid Other | Source: Ambulatory Visit | Attending: Internal Medicine | Admitting: Internal Medicine

## 2017-06-11 ENCOUNTER — Encounter: Payer: Self-pay | Admitting: Internal Medicine

## 2017-06-11 VITALS — BP 136/80 | HR 82 | Resp 16 | Ht 64.5 in | Wt 264.0 lb

## 2017-06-11 DIAGNOSIS — J4551 Severe persistent asthma with (acute) exacerbation: Secondary | ICD-10-CM | POA: Diagnosis present

## 2017-06-11 DIAGNOSIS — J4550 Severe persistent asthma, uncomplicated: Secondary | ICD-10-CM | POA: Diagnosis not present

## 2017-06-11 NOTE — Progress Notes (Signed)
[name]     Synopsis: 47 year old female with morbid obesity, diastolic congestive heart failure, asthma/COPD with chronic hypoxic and hypercapnic respiratory failure, complicated by chronic kidney disease..   Assessment and Plan:  Asthma with chronic bronchitis.  -Patient is asked to use continue using nebulized duo nebs as needed, continue Symbicort. -Suspect an allergic component.  We will obtain results of her allergy testing done a few years ago at [facility].  I am checking an IgE level today. -She is asked to start an antihistamine once daily.  Dyspnea with chronic hypoxic respiratory failure. -I suspect a the major driver of her dyspnea is morbid obesity, restrictive lung disease related to obesity, and pulmonary restriction from cardiomegaly.  Morbid obesity likely with OHS; BMI=61 was restrictive lung disease. --We tried to see if she could be started on Bipap but insurance not approved. In addition when she was tried in the hospital she did not tolerate it.   Deconditioning. -Likely contributing to dyspnea. -We referred the patient back to pulmonary rehabilitation.  Chronic diastolic congestive heart failure with cardiomegaly, history of pericardial effusion. -Continue to follow up with cardiology.  Date: 06/11/2017  MRN# [id number] [name] 1970-07-11    [name] is a 47 y.o. old female seen in consultation for chief complaint of:    Chief Complaint  Patient presents with  . Hospitalization Follow-up    Pt states asthma/COPD/allergies are not controlled  . Shortness of Breath    with exertion  . Cough    green mucus x 1-2 weeks.    HPI:   47 yo morbidly obese woman with uncontrolled diabetes with history of chronic diastolic CHF, hospitalization October 2016, November 2016 May 2018, September 2018, November 2018.  She was recently discharged about a month ago for ARF, CHF, volume overload, she is now on HD. She  went to rehab for a few week, she is doing better, her strength has improved but not back to baseline. She is using proair 4 times per day, seems to help, she uses nebs also. Currently she is also on symbicort, 2 puffs bid and rinses mouth.  She has 2 dogs, not in bedroom.  She takes cetirizine occasionally. She has had allergy testing in the past and was allergic to dust, grass, ragweed and others, she thinks the test was done at [facility] about 5 years ago.   She has been referred to pulmonary rehab in the past but but did not follow-up.   Echocardiogram 12/15/14: EF equals 55%, mild left atrial dilation, small to moderate pericardial effusion, no TR noted. **Abs eosinophils count as high as 300 on 01/03/15.   Medication:   Reviewed.     Allergies:  Dust mite extract; Mold extract [trichophyton]; Feraheme [ferumoxytol]; Pollen extract; and Sulfa antibiotics  Review of Systems: Gen:  Denies  fever, sweats, chills HEENT: Denies blurred vision, double vision. bleeds, sore throat Cvc:  No dizziness, chest pain. Resp:   Denies cough or sputum production. Gi: Denies swallowing difficulty, stomach pain. Gu:  Denies bladder incontinence, burning urine Ext:   No Joint pain, stiffness. Skin: No skin rash,  hives  Endoc:  No polyuria, polydipsia. Psych: No depression, insomnia. Other:  All other systems were reviewed with the patient and were negative other that what is mentioned in the HPI.   Physical Examination:   VS: BP 136/80 (BP Location: Left Arm, Cuff Size: Large)   Pulse 82   Resp 16   Ht 5' 4.5" (1.638  m)   Wt 264 lb (119.7 kg)   LMP 09/06/2016 (Approximate)   SpO2 93%   BMI 44.62 kg/m   General Appearance: No distress  Neuro:without focal findings,  speech normal,  HEENT: PERRLA, EOM intact.   Pulmonary: normal breath sounds, No wheezing.  CardiovascularNormal S1,S2.  No m/r/g.   Abdomen: Benign, Soft, non-tender. Renal:  No costovertebral tenderness  GU:  No  performed at this time. Endoc: No evident thyromegaly, no signs of acromegaly. Skin:   warm, no rashes, no ecchymosis  Extremities: normal, no cyanosis, clubbing.  Other findings:    LABORATORY PANEL:   CBC No results for input(s): WBC, HGB, HCT, PLT in the last 168 hours. ------------------------------------------------------------------------------------------------------------------  Chemistries  No results for input(s): NA, K, CL, CO2, GLUCOSE, BUN, CREATININE, CALCIUM, MG, AST, ALT, ALKPHOS, BILITOT in the last 168 hours.  Invalid input(s): GFRCGP ------------------------------------------------------------------------------------------------------------------  Cardiac Enzymes No results for input(s): TROPONINI in the last 168 hours. ------------------------------------------------------------  RADIOLOGY:  No results found.     Thank  you for the consultation and for allowing [facility] to assist in the care of your patient. Our recommendations are noted above.  Please contact us if we can be of further service.   Marda Stalker, MD.  Board Certified in Internal Medicine, Pulmonary Medicine, [place], and Sleep Medicine.  [facility] Office Number: [phone]  Patricia Pesa, M.D.  Merton Border, M.D  06/11/2017

## 2017-06-11 NOTE — Patient Instructions (Signed)
Will need to obtain results of allergy testing from [facility].  Will check IgE blood test.  Restart your daily antihistamine.

## 2017-06-15 MED ORDER — CEFAZOLIN SODIUM-DEXTROSE 1-4 GM/50ML-% IV SOLN
1.0000 g | Freq: Once | INTRAVENOUS | Status: AC
  Administered 2017-06-16: 1 g via INTRAVENOUS

## 2017-06-16 ENCOUNTER — Ambulatory Visit: Payer: Medicaid Other | Admitting: Anesthesiology

## 2017-06-16 ENCOUNTER — Ambulatory Visit
Admission: RE | Admit: 2017-06-16 | Discharge: 2017-06-16 | Disposition: A | Discharge status: Home / Self Care | Payer: Medicaid Other | Source: Ambulatory Visit | Attending: Vascular Surgery | Admitting: Vascular Surgery

## 2017-06-16 ENCOUNTER — Encounter: Payer: Self-pay | Admitting: Emergency Medicine

## 2017-06-16 ENCOUNTER — Telehealth: Payer: Self-pay | Admitting: Internal Medicine

## 2017-06-16 ENCOUNTER — Encounter
Admission: RE | Disposition: A | Discharge status: Home / Self Care | Payer: Self-pay | Source: Ambulatory Visit | Attending: Vascular Surgery

## 2017-06-16 DIAGNOSIS — J449 Chronic obstructive pulmonary disease, unspecified: Secondary | ICD-10-CM | POA: Diagnosis not present

## 2017-06-16 DIAGNOSIS — Z79899 Other long term (current) drug therapy: Secondary | ICD-10-CM | POA: Diagnosis not present

## 2017-06-16 DIAGNOSIS — N183 Chronic kidney disease, stage 3 (moderate): Secondary | ICD-10-CM | POA: Insufficient documentation

## 2017-06-16 DIAGNOSIS — Z833 Family history of diabetes mellitus: Secondary | ICD-10-CM | POA: Insufficient documentation

## 2017-06-16 DIAGNOSIS — E1122 Type 2 diabetes mellitus with diabetic chronic kidney disease: Secondary | ICD-10-CM | POA: Insufficient documentation

## 2017-06-16 DIAGNOSIS — Z8249 Family history of ischemic heart disease and other diseases of the circulatory system: Secondary | ICD-10-CM | POA: Diagnosis not present

## 2017-06-16 DIAGNOSIS — Z6841 Body Mass Index (BMI) 40.0 and over, adult: Secondary | ICD-10-CM | POA: Diagnosis not present

## 2017-06-16 DIAGNOSIS — I509 Heart failure, unspecified: Secondary | ICD-10-CM | POA: Diagnosis not present

## 2017-06-16 DIAGNOSIS — Z9889 Other specified postprocedural states: Secondary | ICD-10-CM | POA: Diagnosis not present

## 2017-06-16 DIAGNOSIS — Y832 Surgical operation with anastomosis, bypass or graft as the cause of abnormal reaction of the patient, or of later complication, without mention of misadventure at the time of the procedure: Secondary | ICD-10-CM | POA: Insufficient documentation

## 2017-06-16 DIAGNOSIS — Z794 Long term (current) use of insulin: Secondary | ICD-10-CM | POA: Diagnosis not present

## 2017-06-16 DIAGNOSIS — Z9981 Dependence on supplemental oxygen: Secondary | ICD-10-CM | POA: Insufficient documentation

## 2017-06-16 DIAGNOSIS — Z888 Allergy status to other drugs, medicaments and biological substances status: Secondary | ICD-10-CM | POA: Diagnosis not present

## 2017-06-16 DIAGNOSIS — Z91048 Other nonmedicinal substance allergy status: Secondary | ICD-10-CM | POA: Insufficient documentation

## 2017-06-16 DIAGNOSIS — T82858A Stenosis of vascular prosthetic devices, implants and grafts, initial encounter: Secondary | ICD-10-CM | POA: Diagnosis not present

## 2017-06-16 DIAGNOSIS — Z7982 Long term (current) use of aspirin: Secondary | ICD-10-CM | POA: Insufficient documentation

## 2017-06-16 DIAGNOSIS — T82868A Thrombosis of vascular prosthetic devices, implants and grafts, initial encounter: Secondary | ICD-10-CM | POA: Diagnosis not present

## 2017-06-16 DIAGNOSIS — N186 End stage renal disease: Secondary | ICD-10-CM

## 2017-06-16 DIAGNOSIS — I130 Hypertensive heart and chronic kidney disease with heart failure and stage 1 through stage 4 chronic kidney disease, or unspecified chronic kidney disease: Secondary | ICD-10-CM | POA: Insufficient documentation

## 2017-06-16 DIAGNOSIS — Z882 Allergy status to sulfonamides status: Secondary | ICD-10-CM | POA: Insufficient documentation

## 2017-06-16 HISTORY — PX: A/V FISTULAGRAM: CATH118298

## 2017-06-16 LAB — GLUCOSE, CAPILLARY
GLUCOSE-CAPILLARY: 151 mg/dL — AB (ref 65–99)
GLUCOSE-CAPILLARY: 155 mg/dL — AB (ref 65–99)

## 2017-06-16 LAB — POTASSIUM (ARMC VASCULAR LAB ONLY): POTASSIUM (ARMC VASCULAR LAB): 3.7 (ref 3.5–5.1)

## 2017-06-16 SURGERY — A/V FISTULAGRAM
Anesthesia: General | Laterality: Left

## 2017-06-16 MED ORDER — ONDANSETRON HCL 4 MG/2ML IJ SOLN: 4.0000 mg | Freq: Once | INTRAMUSCULAR | Status: DC | PRN

## 2017-06-16 MED ORDER — FENTANYL CITRATE (PF) 100 MCG/2ML IJ SOLN: 25.0000 ug | INTRAMUSCULAR | Status: DC | PRN

## 2017-06-16 MED ORDER — HEPARIN SODIUM (PORCINE) 1000 UNIT/ML IJ SOLN
INTRAMUSCULAR | Status: DC | PRN
  Administered 2017-06-16: 4000 [IU] via INTRAVENOUS

## 2017-06-16 MED ORDER — SUCCINYLCHOLINE CHLORIDE 20 MG/ML IJ SOLN
INTRAMUSCULAR | Status: AC
  Filled 2017-06-16: qty 1

## 2017-06-16 MED ORDER — LIDOCAINE HCL (PF) 2 % IJ SOLN
INTRAMUSCULAR | Status: AC
  Filled 2017-06-16: qty 10

## 2017-06-16 MED ORDER — SODIUM CHLORIDE 0.9 % IV SOLN
INTRAVENOUS | Status: DC | PRN
  Administered 2017-06-16: 30 ug/min via INTRAVENOUS

## 2017-06-16 MED ORDER — FAMOTIDINE 20 MG PO TABS: 40.0000 mg | ORAL_TABLET | ORAL | Status: DC | PRN

## 2017-06-16 MED ORDER — FENTANYL CITRATE (PF) 100 MCG/2ML IJ SOLN
INTRAMUSCULAR | Status: DC | PRN
  Administered 2017-06-16: 25 ug via INTRAVENOUS

## 2017-06-16 MED ORDER — LIDOCAINE-EPINEPHRINE (PF) 1 %-1:200000 IJ SOLN
INTRAMUSCULAR | Status: AC
  Filled 2017-06-16: qty 30

## 2017-06-16 MED ORDER — HEPARIN (PORCINE) IN NACL 1000-0.9 UT/500ML-% IV SOLN
INTRAVENOUS | Status: AC
  Filled 2017-06-16: qty 1000

## 2017-06-16 MED ORDER — HYDROMORPHONE HCL 1 MG/ML IJ SOLN: 1.0000 mg | Freq: Once | INTRAMUSCULAR | Status: DC | PRN

## 2017-06-16 MED ORDER — IOPAMIDOL (ISOVUE-300) INJECTION 61%
INTRAVENOUS | Status: DC | PRN
  Administered 2017-06-16: 25 mL via INTRAVENOUS

## 2017-06-16 MED ORDER — PROMETHAZINE HCL 25 MG/ML IJ SOLN: 6.2500 mg | INTRAMUSCULAR | Status: DC | PRN

## 2017-06-16 MED ORDER — METHYLPREDNISOLONE SODIUM SUCC 125 MG IJ SOLR: 125.0000 mg | INTRAMUSCULAR | Status: DC | PRN

## 2017-06-16 MED ORDER — ACETAMINOPHEN 500 MG PO TABS
1000.0000 mg | ORAL_TABLET | Freq: Once | ORAL | Status: AC
  Administered 2017-06-16: 1000 mg via ORAL

## 2017-06-16 MED ORDER — PROPOFOL 10 MG/ML IV BOLUS
INTRAVENOUS | Status: AC
  Filled 2017-06-16: qty 20

## 2017-06-16 MED ORDER — MEPERIDINE HCL 25 MG/ML IJ SOLN: 6.2500 mg | INTRAMUSCULAR | Status: DC | PRN

## 2017-06-16 MED ORDER — PROPOFOL 10 MG/ML IV BOLUS
INTRAVENOUS | Status: DC | PRN
  Administered 2017-06-16 (×2): 50 mg via INTRAVENOUS
  Administered 2017-06-16: 160 mg via INTRAVENOUS

## 2017-06-16 MED ORDER — PROPOFOL 500 MG/50ML IV EMUL
INTRAVENOUS | Status: DC | PRN
  Administered 2017-06-16: 75 ug/kg/min via INTRAVENOUS

## 2017-06-16 MED ORDER — ONDANSETRON HCL 4 MG/2ML IJ SOLN: 4.0000 mg | Freq: Four times a day (QID) | INTRAMUSCULAR | Status: DC | PRN

## 2017-06-16 MED ORDER — PROPOFOL 500 MG/50ML IV EMUL
INTRAVENOUS | Status: AC
  Filled 2017-06-16: qty 50

## 2017-06-16 MED ORDER — DEXMEDETOMIDINE HCL IN NACL 200 MCG/50ML IV SOLN
INTRAVENOUS | Status: AC
  Filled 2017-06-16: qty 50

## 2017-06-16 MED ORDER — ACETAMINOPHEN 500 MG PO TABS
ORAL_TABLET | ORAL | Status: AC
  Administered 2017-06-16: 1000 mg via ORAL
  Filled 2017-06-16: qty 2

## 2017-06-16 MED ORDER — LIDOCAINE HCL (CARDIAC) PF 100 MG/5ML IV SOSY
PREFILLED_SYRINGE | INTRAVENOUS | Status: DC | PRN
  Administered 2017-06-16: 100 mg via INTRAVENOUS

## 2017-06-16 MED ORDER — FENTANYL CITRATE (PF) 100 MCG/2ML IJ SOLN
INTRAMUSCULAR | Status: AC
  Filled 2017-06-16: qty 2

## 2017-06-16 MED ORDER — PHENYLEPHRINE HCL 10 MG/ML IJ SOLN
INTRAMUSCULAR | Status: AC
  Filled 2017-06-16: qty 1

## 2017-06-16 MED ORDER — SUCCINYLCHOLINE CHLORIDE 20 MG/ML IJ SOLN
INTRAMUSCULAR | Status: DC | PRN
  Administered 2017-06-16: 100 mg via INTRAVENOUS

## 2017-06-16 MED ORDER — DEXAMETHASONE SODIUM PHOSPHATE 10 MG/ML IJ SOLN
INTRAMUSCULAR | Status: AC
  Filled 2017-06-16: qty 1

## 2017-06-16 MED ORDER — ONDANSETRON HCL 4 MG/2ML IJ SOLN
INTRAMUSCULAR | Status: DC | PRN
  Administered 2017-06-16: 4 mg via INTRAVENOUS

## 2017-06-16 MED ORDER — SODIUM CHLORIDE 0.9 % IV SOLN
INTRAVENOUS | Status: DC
  Administered 2017-06-16: 08:00:00 via INTRAVENOUS

## 2017-06-16 MED ORDER — DEXMEDETOMIDINE HCL IN NACL 200 MCG/50ML IV SOLN
INTRAVENOUS | Status: DC | PRN
  Administered 2017-06-16: 20 ug via INTRAVENOUS

## 2017-06-16 SURGICAL SUPPLY — 14 items
BALLN LUTONIX DCB 6X80X130 (BALLOONS) ×3
BALLOON LUTONIX DCB 6X80X130 (BALLOONS) IMPLANT
CANNULA 5F STIFF (CANNULA) ×2 IMPLANT
CATH BEACON 5 .035 40 KMP TP (CATHETERS) IMPLANT
CATH BEACON 5 .038 40 KMP TP (CATHETERS) ×2
DEVICE PRESTO INFLATION (MISCELLANEOUS) ×2 IMPLANT
DEVICE TORQUE .025-.038 (MISCELLANEOUS) ×2 IMPLANT
DRAPE BRACHIAL (DRAPES) ×2 IMPLANT
GLIDEWIRE ADV .035X180CM (WIRE) ×2 IMPLANT
GUIDEWIRE LT ZIPWIRE 035X260 (WIRE) ×2 IMPLANT
PACK ANGIOGRAPHY (CUSTOM PROCEDURE TRAY) ×3 IMPLANT
SHEATH BRITE TIP 6FRX5.5 (SHEATH) ×2 IMPLANT
SUT MNCRL AB 4-0 PS2 18 (SUTURE) ×2 IMPLANT
TOWEL OR 17X26 4PK STRL BLUE (TOWEL DISPOSABLE) ×2 IMPLANT

## 2017-06-16 NOTE — Discharge Instructions (Signed)
Fistulogram, Care After Refer to this sheet in the next few weeks. These instructions provide you with information on caring for yourself after your procedure. Your health care provider may also give you more specific instructions. Your treatment has been planned according to current medical practices, but problems sometimes occur. Call your health care provider if you have any problems or questions after your procedure. What can I expect after the procedure? After your procedure, it is typical to have the following:  A small amount of discomfort in the area where the catheters were placed.  A small amount of bruising around the fistula.  Sleepiness and fatigue.  Follow these instructions at home:  Rest at home for the day following your procedure.  Do not drive or operate heavy machinery while taking pain medicine.  Take medicines only as directed by your health care provider.  Do not take baths, swim, or use a hot tub until your health care provider approves. You may shower 24 hours after the procedure or as directed by your health care provider.  There are many different ways to close and cover an incision, including stitches, skin glue, and adhesive strips. Follow your health care provider's instructions on: ? Incision care. ? Bandage (dressing) changes and removal. ? Incision closure removal.  Monitor your dialysis fistula carefully. Contact a health care provider if:  You have drainage, redness, swelling, or pain at your catheter site.  You have a fever.  You have chills. Get help right away if:  You feel weak.  You have trouble balancing.  You have trouble moving your arms or legs.  You have problems with your speech or vision.  You can no longer feel a vibration or buzz when you put your fingers over your dialysis fistula.  The limb that was used for the procedure: ? Swells. ? Is painful. ? Is cold. ? Is discolored, such as blue or pale white. This  information is not intended to replace advice given to you by your health care provider. Make sure you discuss any questions you have with your health care provider. Document Released: 06/28/2013 Document Revised: 07/20/2015 Document Reviewed: 04/02/2013 Elsevier Interactive Patient Education  2018 [place] Anesthesia, Adult, Care After These instructions provide you with information about caring for yourself after your procedure. Your health care provider may also give you more specific instructions. Your treatment has been planned according to current medical practices, but problems sometimes occur. Call your health care provider if you have any problems or questions after your procedure. What can I expect after the procedure? After the procedure, it is common to have:  Vomiting.  A sore throat.  Mental slowness.  It is common to feel:  Nauseous.  Cold or shivery.  Sleepy.  Tired.  Sore or achy, even in parts of your body where you did not have surgery.  Follow these instructions at home: For at least 24 hours after the procedure:  Do not: ? Participate in activities where you could fall or become injured. ? Drive. ? Use heavy machinery. ? Drink alcohol. ? Take sleeping pills or medicines that cause drowsiness. ? Make important decisions or sign legal documents. ? Take care of children on your own.  Rest. Eating and drinking  If you vomit, drink water, juice, or soup when you can drink without vomiting.  Drink enough fluid to keep your urine clear or pale yellow.  Make sure you have little or no nausea before eating solid foods.  Follow  the diet recommended by your health care provider. General instructions  Have a responsible adult stay with you until you are awake and alert.  Return to your normal activities as told by your health care provider. Ask your health care provider what activities are safe for you.  Take over-the-counter and  prescription medicines only as told by your health care provider.  If you smoke, do not smoke without supervision.  Keep all follow-up visits as told by your health care provider. This is important. Contact a health care provider if:  You continue to have nausea or vomiting at home, and medicines are not helpful.  You cannot drink fluids or start eating again.  You cannot urinate after 8-12 hours.  You develop a skin rash.  You have fever.  You have increasing redness at the site of your procedure. Get help right away if:  You have difficulty breathing.  You have chest pain.  You have unexpected bleeding.  You feel that you are having a life-threatening or urgent problem. This information is not intended to replace advice given to you by your health care provider. Make sure you discuss any questions you have with your health care provider. Document Released: 05/20/2000 Document Revised: 07/17/2015 Document Reviewed: 01/26/2015 Elsevier Interactive Patient Education  [employer].

## 2017-06-16 NOTE — Anesthesia Post-op Follow-up Note (Signed)
Anesthesia QCDR form completed.        

## 2017-06-16 NOTE — Telephone Encounter (Signed)
Informed pt that IGE levels were not back yet probably because of the holiday. She states you were going to send her in Azithromycin but I don't see where it was sent or in the OV note. Please advise.

## 2017-06-16 NOTE — Anesthesia Procedure Notes (Addendum)
Procedure Name: Intubation Performed by: Lance Muss, CRNA Pre-anesthesia Checklist: Patient identified, Patient being monitored, Timeout performed, Emergency Drugs available and Suction available Patient Re-evaluated:Patient Re-evaluated prior to induction Oxygen Delivery Method: Circle system utilized Preoxygenation: Pre-oxygenation with 100% oxygen Induction Type: IV induction Ventilation: Mask ventilation without difficulty Laryngoscope Size: Mac and 3 Grade View: Grade I Tube type: Oral Tube size: 7.0 mm Number of attempts: 1 Airway Equipment and Method: Stylet Placement Confirmation: ETT inserted through vocal cords under direct vision,  positive ETCO2 and breath sounds checked- equal and bilateral Secured at: 22 cm Tube secured with: Tape Dental Injury: Teeth and Oropharynx as per pre-operative assessment  Comments: Attempted first 4.0 LMA, unable to deliver appropriate TV. MDA request convert to GETA.

## 2017-06-16 NOTE — Op Note (Signed)
[facility]    OPERATIVE NOTE   PROCEDURE: 1.   Left brachiocephalic arteriovenous fistula cannulation under ultrasound guidance 2.   Left arm fistulagram including central venogram 3.   Percutaneous transluminal angioplasty of mid to distal upper arm cephalic vein stenosis with 6 mm diameter by 8 cm length Lutonix drug-coated  PRE-OPERATIVE DIAGNOSIS: 1. ESRD 2. Poorly functional left brachiocephalic AVF  POST-OPERATIVE DIAGNOSIS: same as above   SURGEON: [name], MD  ANESTHESIA: General  ESTIMATED BLOOD LOSS: 5 cc  FINDING(S): 1. Nearly occlusive stenosis about 5 cm from the anastomosis in the cephalic vein in the mid to distal upper arm.  The remainder of the fistula appeared to be widely patent although the anastomosis itself was difficult to opacify due to branches.  The central venous circulation was widely patent.  SPECIMEN(S):  None  CONTRAST: 25 cc  FLUORO TIME: 2.6 minutes  INDICATIONS: [name] is a 47 y.o. female who presents with malfunctioning left brachiocephalic arteriovenous fistula.  The patient is scheduled for left arm fistulagram.  The patient is aware the risks include but are not limited to: bleeding, infection, thrombosis of the cannulated access, and possible anaphylactic reaction to the contrast.  The patient is aware of the risks of the procedure and elects to proceed forward.  DESCRIPTION: After full informed written consent was obtained, the patient was brought back to the angiography suite and placed supine upon the angiography table.  The patient was connected to monitoring equipment. Moderate conscious sedation was administered with a face to face encounter with the patient throughout the procedure with my supervision of the RN administering medicines and monitoring the patient's vital signs and mental status throughout from the start of the procedure until the patient was taken to the recovery room. The left arm was  prepped and draped in the standard fashion for a percutaneous access intervention.  Under ultrasound guidance, the left brachiocephalic arteriovenous fistula was cannulated with a micropuncture needle under direct ultrasound guidance in an antegrade fashion just beyond the anastomosis within the cephalic vein and a permanent image was performed.  The microwire was advanced into the fistula and the needle was exchanged for the a microsheath.  I then upsized to a 6 Fr Sheath and imaging was performed.  Hand injections were completed to image the access including the central venous system. This demonstrated nearly occlusive stenosis about 5 cm from the anastomosis in the cephalic vein in the mid to distal upper arm.  The remainder of the fistula appeared to be widely patent although the anastomosis itself was difficult to opacify due to branches.  The central venous circulation was widely patent..  Based on the images, this patient will need intervention to this area to make the fistula functional. I then gave the patient 4000 units of intravenous heparin.  I then crossed the stenosis with a Kumpe catheter and an Advantage wire.  Based on the imaging, a 6 mm x 8 cm Lutonix drug-coated angioplasty balloon was selected.  The balloon was centered around the mid to distal upper arm cephalic vein stenosis and inflated to 10 ATM for 1 minute(s).  Second inflation was performed slightly more proximally also to 10 atm for 1 minute.  On completion imaging, a 15-20 % residual stenosis was present.     Based on the completion imaging, no further intervention is necessary.  The wire and balloon were removed from the sheath.  A 4-0 Monocryl purse-string suture was sewn around the  sheath.  The sheath was removed while tying down the suture.  A sterile bandage was applied to the puncture site.  COMPLICATIONS: None  CONDITION: Stable   [name]  06/16/2017 10:30 AM   This note was created with Dragon Medical  transcription system. Any errors in dictation are purely unintentional.

## 2017-06-16 NOTE — Transfer of Care (Signed)
Immediate Anesthesia Transfer of Care Note  Patient: [name]  Procedure(s) Performed: A/V FISTULAGRAM (Left )  Patient Location: PACU  Anesthesia Type:General  Level of Consciousness: awake, alert  and responds to stimulation  Airway & Oxygen Therapy: Patient Spontanous Breathing and Patient connected to face mask oxygen  Post-op Assessment: Report given to RN and Post -op Vital signs reviewed and stable  Post vital signs: Reviewed and stable  Last Vitals:  Vitals Value Taken Time  BP 129/89 06/16/2017 10:55 AM  Temp    Pulse 77 06/16/2017 10:55 AM  Resp 12 06/16/2017 10:55 AM  SpO2 100 % 06/16/2017 10:55 AM    Last Pain:  Vitals:   06/16/17 0748  PainSc: 7          Complications: No apparent anesthesia complications

## 2017-06-16 NOTE — H&P (Signed)
[facility] History & Physical Update  The patient was interviewed and re-examined.  The patient's previous History and Physical has been reviewed and is unchanged.  There is no change in the plan of care. We plan to proceed with the scheduled procedure.  She was unable to have this done under moderate sedation last week, so she will have either deep sedation or general anesthesia with this procedure.  [name], MD  06/16/2017, 8:11 AM

## 2017-06-16 NOTE — Telephone Encounter (Signed)
Will hold off on azithro. Would like to see how she does on antihistamine.

## 2017-06-16 NOTE — Telephone Encounter (Signed)
Please call regarding lab results. Pt also states there was a rx to be called in.

## 2017-06-16 NOTE — Anesthesia Preprocedure Evaluation (Signed)
Anesthesia Evaluation  Patient identified by MRN, date of birth, ID band Patient awake    Reviewed: Allergy & Precautions, H&P , NPO status , [name] Chart, lab work & pertinent test results, reviewed documented beta blocker date and time   Airway Mallampati: III   Neck ROM: full    Dental  (+) Poor Dentition   Pulmonary neg pulmonary ROS, shortness of breath, asthma , pneumonia, COPD,    Pulmonary exam normal        Cardiovascular Exercise Tolerance: Poor hypertension, On Medications +CHF  negative cardio ROS Normal cardiovascular exam Rhythm:regular Rate:Normal     Neuro/Psych PSYCHIATRIC DISORDERS Anxiety Depression negative neurological ROS  negative psych ROS   GI/Hepatic negative GI ROS, Neg liver ROS,   Endo/Other  negative endocrine ROSdiabetesMorbid obesity  Renal/GU CRFRenal diseasenegative Renal ROS  negative genitourinary   Musculoskeletal   Abdominal   Peds  Hematology negative hematology ROS (+) anemia ,   Anesthesia Other Findings Past Medical History: No date: (HFpEF) heart failure with preserved ejection fraction ([place])     Comment:  a. 11/2014 Echo: EF 50-55%, Gr1 DD; b. 06/2016 Echo: EF               60-65%, no rwma, mod dil LA, nl RV, triv eff. No date: Asthma No date: CHF (congestive heart failure) (HCC) No date: CKD (chronic kidney disease), stage III (HCC) No date: COPD (chronic obstructive pulmonary disease) ([place])     Comment:  a. Home O2. No date: Diabetes mellitus without complication (HCC) No date: Hypertension No date: Iron deficiency anemia     Comment:  a. chronic blood loss->heavy menses. No date: Morbid obesity ([place]) No date: Pericardial effusion     Comment:  a. 11/2014 CT Chest: Mod effusion; b. 11/2014 Echo:               small to mod circumferential effusion. No hemodynamic               compromise. No date: Poorly controlled diabetes mellitus ()     Comment:  a.  11/2014 A1c 13.2. Past Surgical History: 06/09/2017: A/V FISTULAGRAM; Left     Comment:  Procedure: A/V FISTULAGRAM;  Surgeon: Algernon Huxley, MD;               Location: [facility];  Service: Cardiovascular;              Laterality: Left; 04/25/2017: AV FISTULA PLACEMENT; Left     Comment:  Procedure: ARTERIOVENOUS (AV) FISTULA CREATION;                Surgeon: Algernon Huxley, MD;  Location: [facility];  Service:              Vascular;  Laterality: Left; No date: CESAREAN SECTION     Comment:  times 3 04/04/2017: DIALYSIS/PERMA CATHETER INSERTION; Right     Comment:  Procedure: DIALYSIS/PERMA CATHETER INSERTION;  Surgeon:               Katha Cabal, MD;  Location: [facility];               Service: Cardiovascular;  Laterality: Right; 04/16/2017: INSERTION OF DIALYSIS CATHETER; N/A     Comment:  Procedure: INSERTION OF DIALYSIS PERM CATHETER;                Surgeon: Algernon Huxley, MD;  Location: [facility];  Service:  Vascular;  Laterality: N/A; BMI    Body Mass Index:  43.93 kg/m     Reproductive/Obstetrics negative OB ROS                             Anesthesia Physical Anesthesia Plan  ASA: III  Anesthesia Plan: General   Post-op Pain Management:    Induction:   PONV Risk Score and Plan:   Airway Management Planned:   Additional Equipment:   Intra-op Plan:   Post-operative Plan:   Informed Consent: I have reviewed the patients History and Physical, chart, labs and discussed the procedure including the risks, benefits and alternatives for the proposed anesthesia with the patient or authorized representative who has indicated his/her understanding and acceptance.   Dental Advisory Given  Plan Discussed with: CRNA  Anesthesia Plan Comments:         Anesthesia Quick Evaluation

## 2017-06-16 NOTE — Telephone Encounter (Signed)
Pt informed. Nothing further needed. 

## 2017-06-18 ENCOUNTER — Encounter: Payer: Self-pay | Admitting: Vascular Surgery

## 2017-06-18 LAB — IGE: IgE (Immunoglobulin E), Serum: 249 IU/mL (ref 6–495)

## 2017-06-18 NOTE — Anesthesia Postprocedure Evaluation (Signed)
Anesthesia Post Note  Patient: [name]  Procedure(s) Performed: A/V FISTULAGRAM (Left )  Patient location during evaluation: PACU Anesthesia Type: General Level of consciousness: awake and alert Pain management: pain level controlled Vital Signs Assessment: post-procedure vital signs reviewed and stable Respiratory status: spontaneous breathing, nonlabored ventilation, respiratory function stable and patient connected to nasal cannula oxygen Cardiovascular status: blood pressure returned to baseline and stable Postop Assessment: no apparent nausea or vomiting Anesthetic complications: no     Last Vitals:  Vitals:   06/16/17 1145 06/16/17 1200  BP: (!) 148/102 (!) 146/94  Pulse: 76 76  Resp: 20 18  Temp:    SpO2: 99% 99%    Last Pain:  Vitals:   06/16/17 1133  PainSc: 7                  Molli Barrows

## 2017-07-02 ENCOUNTER — Other Ambulatory Visit (HOSPITAL_COMMUNITY): Payer: Self-pay

## 2017-07-02 NOTE — Progress Notes (Signed)
Bp: 150/84, pulse: 68, spo2: 92 BG: 193 Weight: ?  First visit with pt today, pt states feels ok.  Went over the program with her and she is willing to let me come and visit once a week.  Since she has left her last Hospital stay she was in skilled nursing for a month and now at home.  She states she can afford meds, she is applying for medicare ssi benefits.  She states a little behind on her rent, was wanting to know if there is help out there to help, advised her I will do some checking and let her know next visit.  She states since dialysis has started she is feeling much better, lost weight and been more active.  She is wheelchair bound at this time.  She is on 4 lpm oxygen by nasal all day. Goes to Dialysis 3 x week.  She has home health nurse comes x2 weekly and starting PT next week.  She is exited about starting PT and has a good attitude toward it.  She wants to get more active and has been little by little per her.  We discussed weighing herself everyday and writing it down, she states she will start.  Advised her importance of checking her sugars more often, she had not checked it today.  Talked about diet, she states just got her appetite back and it is hard.  Discussed low salt diets and importance of it.  Advised her if weight changes more than 3 lbs overnight or 5 lbs in a week to let me know or call clinic.  She is due for a HF clinic appt and she states she will make one, advised Estill Bamberg at [facility] clinic that if one is not made by next appt I will check and see if she will let me call and make her one.   Also advised Estill Bamberg at [facility] clinic of Atorvastatin.  Atorvastatin notes state on 4/22 pt had stopped, verified today pt is taking it.  Meds verified with husband today.  Husband gives pt her meds and states she is taking all of them.  He states he does not get confused.  Asked if need a pill box, he advised he just prefers getting them out of the bottles.  When a change in med they get the pharmacy to  redo the bottle.  Pt complains of dizziness when lays down, she complains of headaches, she states has eye problems.  Denies shortness of breath while sitting, she does not walk at this time.  Followup with pt next week.   [employer] [phone]

## 2017-07-03 ENCOUNTER — Other Ambulatory Visit (INDEPENDENT_AMBULATORY_CARE_PROVIDER_SITE_OTHER): Payer: Self-pay | Admitting: Vascular Surgery

## 2017-07-03 DIAGNOSIS — N185 Chronic kidney disease, stage 5: Secondary | ICD-10-CM

## 2017-07-07 ENCOUNTER — Ambulatory Visit (INDEPENDENT_AMBULATORY_CARE_PROVIDER_SITE_OTHER): Payer: Medicaid Other

## 2017-07-07 ENCOUNTER — Ambulatory Visit (INDEPENDENT_AMBULATORY_CARE_PROVIDER_SITE_OTHER): Payer: Medicaid Other | Admitting: Vascular Surgery

## 2017-07-07 ENCOUNTER — Encounter (INDEPENDENT_AMBULATORY_CARE_PROVIDER_SITE_OTHER): Payer: Self-pay | Admitting: Vascular Surgery

## 2017-07-07 VITALS — BP 164/96 | HR 90 | Resp 16 | Ht 64.5 in | Wt 265.0 lb

## 2017-07-07 DIAGNOSIS — N185 Chronic kidney disease, stage 5: Secondary | ICD-10-CM

## 2017-07-07 DIAGNOSIS — N186 End stage renal disease: Secondary | ICD-10-CM

## 2017-07-07 DIAGNOSIS — D500 Iron deficiency anemia secondary to blood loss (chronic): Secondary | ICD-10-CM

## 2017-07-08 ENCOUNTER — Encounter (INDEPENDENT_AMBULATORY_CARE_PROVIDER_SITE_OTHER): Payer: Self-pay | Admitting: Vascular Surgery

## 2017-07-08 NOTE — Progress Notes (Signed)
Subjective:    Patient ID: [name], female    DOB: 25-Dec-1970, 47 y.o.   MRN: [id number] Chief Complaint  Patient presents with  . Follow-up    [facility] HDA   Patient presents for her first post procedure follow-up.  The patient is status post creation of a left brachiocephalic AV fistula on April 25, 2017.  The patient is status post a left upper extremity fistulogram on June 16, 2017.  The patient has not started using her brachiocephalic AV fistula at this time.  She is currently being maintained by a left IJ PermCath.  The patient denies any left upper extremity pain or discomfort.  Patient denies any left upper extremity hand ulceration.  The patient underwent a left upper extremity HDA which is notable for patent left brachiocephalic AV fistula with multiple competing venous branches.  Total flow volume was 1441.  The patient denies any fever, nausea or vomiting.  Review of Systems  Constitutional: Negative.   HENT: Negative.   Eyes: Negative.   Respiratory: Negative.   Cardiovascular: Negative.   Gastrointestinal: Negative.   Endocrine: Negative.   Genitourinary:       ESRD  Musculoskeletal: Negative.   Skin: Negative.   Allergic/Immunologic: Negative.   Neurological: Negative.   Hematological: Negative.   Psychiatric/Behavioral: Negative.       Objective:   Physical Exam  Constitutional: She is oriented to person, place, and time. She appears well-developed and well-nourished. No distress.  HENT:  Head: Normocephalic and atraumatic.  Right Ear: External ear normal.  Left Ear: External ear normal.  Eyes: Pupils are equal, round, and reactive to light. Conjunctivae are normal.  Neck: Normal range of motion.  Cardiovascular: Normal rate, regular rhythm, normal heart sounds and intact distal pulses.  Pulses:      Radial pulses are 2+ on the right side, and 2+ on the left side.  Left upper extremity brachiocephalic AV fistula: Good bruit and thrill.  Skin is intact.    Pulmonary/Chest: Effort normal and breath sounds normal.  Musculoskeletal: Normal range of motion. She exhibits edema (Mild to moderate bilateral lower extremity edema noted).  Neurological: She is alert and oriented to person, place, and time.  Skin: Skin is warm and dry. She is not diaphoretic.  Psychiatric: She has a normal mood and affect. Her behavior is normal. Judgment and thought content normal.  Vitals reviewed.  BP (!) 164/96 (BP Location: Right Arm)   Pulse 90   Resp 16   Ht 5' 4.5" (1.638 m)   Wt 265 lb (120.2 kg)   LMP 09/06/2016 (Approximate)   BMI 44.78 kg/m   Past Medical History:  Diagnosis Date  . (HFpEF) heart failure with preserved ejection fraction ([place])    a. 11/2014 Echo: EF 50-55%, Gr1 DD; b. 06/2016 Echo: EF 60-65%, no rwma, mod dil LA, nl RV, triv eff.  . Asthma   . CHF (congestive heart failure) ([place])   . CKD (chronic kidney disease), stage III ([place])   . COPD (chronic obstructive pulmonary disease) ([place])    a. Home O2.  . Diabetes mellitus without complication ([place])   . Hypertension   . Iron deficiency anemia    a. chronic blood loss->heavy menses.  . Morbid obesity ([place])   . Pericardial effusion    a. 11/2014 CT Chest: Mod effusion; b. 11/2014 Echo: small to mod circumferential effusion. No hemodynamic compromise.  [name] Poorly controlled diabetes mellitus ([place])    a. 11/2014 A1c 13.2.   Social  History   Socioeconomic History  . Marital status: Married    Spouse name: Not on file  . Number of children: Not on file  . Years of education: 56  . Highest education level: High school graduate  Occupational History  . Occupation: Disabled  Social Needs  . Financial resource strain: Very hard  . Food insecurity:    Worry: Often true    Inability: Often true  . Transportation needs:    Medical: Yes    Non-medical: Yes  Tobacco Use  . Smoking status: Never Smoker  . Smokeless tobacco: Never Used  Substance and Sexual Activity  . Alcohol use:  Yes    Alcohol/week: 0.0 oz    Comment: occasional  . Drug use: Yes    Types: Marijuana  . Sexual activity: Not on file  Lifestyle  . Physical activity:    Days per week: 0 days    Minutes per session: 0 min  . Stress: Not at all  Relationships  . Social connections:    Talks on phone: More than three times a week    Gets together: Once a week    Attends religious service: Never    Active member of club or organization: No    Attends meetings of clubs or organizations: Never    Relationship status: Married  . Intimate partner violence:    Fear of current or ex partner: Patient refused    Emotionally abused: Patient refused    Physically abused: Patient refused    Forced sexual activity: Patient refused  Other Topics Concern  . Not on file  Social History Narrative   Lives in [place] with her husband and 23 y/o child.  Three other children are grown and out of the house.  She is sedentary and does not routinely exercise.  On home O2.   Past Surgical History:  Procedure Laterality Date  . A/V FISTULAGRAM Left 06/09/2017   Procedure: A/V FISTULAGRAM;  Surgeon: Algernon Huxley, MD;  Location: [facility];  Service: Cardiovascular;  Laterality: Left;  . A/V FISTULAGRAM Left 06/16/2017   Procedure: A/V FISTULAGRAM;  Surgeon: Algernon Huxley, MD;  Location: [facility];  Service: Cardiovascular;  Laterality: Left;  . AV FISTULA PLACEMENT Left 04/25/2017   Procedure: ARTERIOVENOUS (AV) FISTULA CREATION;  Surgeon: Algernon Huxley, MD;  Location: [facility];  Service: Vascular;  Laterality: Left;  . CESAREAN SECTION     times 3  . DIALYSIS/PERMA CATHETER INSERTION Right 04/04/2017   Procedure: DIALYSIS/PERMA CATHETER INSERTION;  Surgeon: Katha Cabal, MD;  Location: [facility];  Service: Cardiovascular;  Laterality: Right;  . INSERTION OF DIALYSIS CATHETER N/A 04/16/2017   Procedure: INSERTION OF DIALYSIS PERM CATHETER;  Surgeon: Algernon Huxley, MD;  Location: [facility];   Service: Vascular;  Laterality: N/A;   Family History  Problem Relation Age of Onset  . Diabetes Mother   . Hypertension Mother   . Hypertension Father   . Diabetes Father   . Hypertension Unknown   . Diabetes Unknown   . Breast cancer Maternal Aunt 54  . Breast cancer Maternal Aunt 50   Allergies  Allergen Reactions  . Dust Mite Extract Shortness Of Breath and Itching  . Mold Extract [Trichophyton] Shortness Of Breath and Itching  . Feraheme [Ferumoxytol] Itching  . Pollen Extract Other (See Comments)    Unknown  . Sulfa Antibiotics Itching      Assessment & Plan:  Patient presents for her  first post procedure follow-up.  The patient is status post creation of a left brachiocephalic AV fistula on April 25, 2017.  The patient is status post a left upper extremity fistulogram on June 16, 2017.  The patient has not started using her brachiocephalic AV fistula at this time.  She is currently being maintained by a left IJ PermCath.  The patient denies any left upper extremity pain or discomfort.  Patient denies any left upper extremity hand ulceration.  The patient underwent a left upper extremity HDA which is notable for patent left brachiocephalic AV fistula with multiple competing venous branches.  Total flow volume was 1441.  The patient denies any fever, nausea or vomiting.  1. ESRD (end stage renal disease) ([place]) - Stable Patient presents for her first post procedure follow-up. The patient has not started to use her fistula at this time.  She is currently being maintained by a left IJ PermCath. The patient underwent a left upper extremity HDA which was notable for patent left brachiocephalic AV fistula The patient is to start using this fistula.  Her competing branches may cause an issue however we will not know unless she starts using her fistula A letter was given to the patient stating that she can start using her fistula as of Jul 07, 2017 Once the patient is dialyzing  appropriately from her fistula we will plan on removing her PermCath The patient should follow-up every 6 months for surveillance HDA's The patient was instructed to call the office in the interim if any issues with dialysis access / doppler flow, pain, edema, pallor, fistula skin breakdown or ulceration of the arm / hand occur. The patient expressed their understanding.  - VAS US DUPLEX DIALYSIS ACCESS (AVF,AVG); Future  2. Iron deficiency anemia due to chronic blood loss - Stable The patient presents asymptomatically. This is followed by the patient's nephrologist and primary care physician  Current Outpatient Medications on File Prior to Visit  Medication Sig Dispense Refill  . acetaminophen (TYLENOL) 500 MG tablet Take 1,000 mg by mouth every 6 (six) hours as needed for mild pain, fever or headache.    . albuterol (PROVENTIL HFA;VENTOLIN HFA) 108 (90 Base) MCG/ACT inhaler Inhale 2 puffs into the lungs every 6 (six) hours as needed for wheezing or shortness of breath. 1 Inhaler 3  . albuterol (PROVENTIL) (2.5 MG/3ML) 0.083% nebulizer solution Take 2.5 mg by nebulization every 6 (six) hours as needed for wheezing or shortness of breath.    [name] aspirin EC 81 MG tablet Take 81 mg by mouth daily.     [name] atorvastatin (LIPITOR) 80 MG tablet Take 1 tablet (80 mg total) by mouth daily at 6 PM. 30 tablet 0  . b complex-vitamin c-folic acid (NEPHRO-VITE) 0.8 MG TABS tablet Take 1 tablet by mouth at bedtime.    . Cholecalciferol (HM VITAMIN D3) 4000 units CAPS Take 1 capsule by mouth once a week.    . cloNIDine (CATAPRES) 0.2 MG tablet Take 1 tablet (0.2 mg total) by mouth 2 (two) times daily. 30 tablet 0  . insulin aspart (NOVOLOG) 100 UNIT/ML injection Inject 3 Units into the skin 3 (three) times daily with meals. 10 mL 11  . insulin aspart (NOVOLOG) 100 UNIT/ML injection Inject 0-5 Units into the skin at bedtime. 10 mL 11  . ipratropium-albuterol (DUONEB) 0.5-2.5 (3) MG/3ML SOLN Take 3 mLs by  nebulization every 6 (six) hours as needed. 360 mL 5  . losartan (COZAAR) 50 MG tablet Take 50 mg  daily by mouth.    . metoprolol tartrate (LOPRESSOR) 100 MG tablet Take 1 tablet (100 mg total) by mouth 2 (two) times daily. 60 tablet 0  . oxyCODONE (OXY IR/ROXICODONE) 5 MG immediate release tablet Take 1 tablet (5 mg total) by mouth every 6 (six) hours as needed for moderate pain. 30 tablet 0  . sevelamer carbonate (RENVELA) 800 MG tablet Take 1 tablet (800 mg total) by mouth 3 (three) times daily with meals. 30 tablet 0  . torsemide (DEMADEX) 100 MG tablet Take 1 tablet (100 mg total) by mouth 2 (two) times daily. 180 tablet 0  . guaiFENesin-dextromethorphan (ROBITUSSIN DM) 100-10 MG/5ML syrup Take 5 mLs every 4 (four) hours as needed by mouth for cough. (Patient not taking: Reported on 07/02/2017) 118 mL 0  . insulin aspart (NOVOLOG) 100 UNIT/ML injection Inject 0-15 Units into the skin 3 (three) times daily with meals. (Patient not taking: Reported on 07/02/2017) 10 mL 11  . lactulose (CHRONULAC) 10 GM/15ML solution Take 30 mLs (20 g total) by mouth daily as needed for severe constipation. (Patient not taking: Reported on 07/02/2017) 240 mL 0  . polyethylene glycol (MIRALAX / GLYCOLAX) packet Take 17 g by mouth daily as needed for mild constipation. (Patient not taking: Reported on 07/02/2017) 14 each 0   No current facility-administered medications on file prior to visit.    There are no Patient Instructions on file for this visit. No follow-ups on file.  Edelmiro Innocent A Dijuan Sleeth, PA-C

## 2017-07-09 ENCOUNTER — Encounter (HOSPITAL_COMMUNITY): Payer: Self-pay

## 2017-07-09 ENCOUNTER — Other Ambulatory Visit (HOSPITAL_COMMUNITY): Payer: Self-pay

## 2017-07-09 NOTE — Progress Notes (Signed)
BP: 168/80, pulse: 74, Spo2: 94%, BG: 164, Weight last:? Today weight: 256 lbs  Todays visit pt states feeling good.  She just finished with physical therapy.  She states doing good with physical therapy, she is getting around walking and standing better.  She states oxygen still dropping when walks around so she is still on oxygen.  She states she has cooked supper once, something she has not done is a couple of years. She has met all of her dialysis appts and seen vein and vascular this week, good followup she said.  She had not made an appt with heart failure clinic so while I was there she agreed to let me call and make appt.  Appt is scheduled for June 5.  She is weighing and checking sugars everyday she said.  Her husband states she is taking all of her meds, went over them with him.  Pt and husband states that they keep up with getting refills, only one needs refill at this time, an inhaler and son is picking it up today.  Verified meds. Edema in lower extremities appear better today, still some.  Lung sounds clear.  Pt denies headaches, shortness of breath while sitting, gets short of breath while walking but not as bad, and denies dizziness.  Appetite is good, been watching salty foods.  Pt still on 4lpm oxygen at all times. She states she has transportation to meet her medical appts. Gave pt info on assistance with help on utilities and rent, as long as she meets their requirements.   Will followup with pt next week.   [place] [facility] [phone]

## 2017-07-16 ENCOUNTER — Encounter (HOSPITAL_COMMUNITY): Payer: Self-pay

## 2017-07-16 ENCOUNTER — Other Ambulatory Visit (HOSPITAL_COMMUNITY): Payer: Self-pay

## 2017-07-16 NOTE — Progress Notes (Signed)
BP: 162/78 PULSE: 81 RESP: 16 LAST WEIGHT: 256 BGL: 342  Todays visit with pt she is feeling good.  She states physical therapy is going good.  She states she was able to walk up the ramp to her house and inside yesterday, was a big accomplishment. She still gets short of breath with any walking and pulse ox will drop.  She states no short of breath while sitting.  She denies chest pain, dizziness or headaches.  She states watches her diet and takes all of her meds.  Her husband gives her meds.  Verified her meds with her and her husband.  She has made her dialysis appts. Still reminding her to weigh everyday and check sugars everyday and importance of it.  Will visit with pt next week.    [place] [phone]

## 2017-07-23 ENCOUNTER — Encounter (HOSPITAL_COMMUNITY): Payer: Self-pay

## 2017-07-23 ENCOUNTER — Other Ambulatory Visit (HOSPITAL_COMMUNITY): Payer: Self-pay

## 2017-07-23 NOTE — Progress Notes (Signed)
BP: 160/92, Pulse: 77, Resp: 18, SPO2: 93%, BGL: 115  Last week Weight: 256 This week: 255  Todays visit pt states feels good, she excited about her birthday next week.  She has been improving with her physical therapy at home, walking without walker more in house.  Been out to stores shopping a couple of times.  She states with this heat having more breathing problems with her asthma when she goes out, advised her to stay inside in air conditioner as much as possible, she does has dialysis 3 times a week.  Lungs clear in upper some wheezing in lower left lobe, right lower clear.  Pt advised doing her inhalers and neb treatments.  Pt wearing her oxygen at all times.  Pt has some edema in lower r ankle area, left leg swelling looks good.  Pt states watching what she eats and watching fluid intake.  Reinforced that pt needs to write weights down for me, she states weighing everyday and not gaining more than 3 lbs a day.  Her husband and her states she has been taking all her meds, they are not out of none.  Pt has a hf clinic appt next week, will visit with pt then.    [place] [phone]

## 2017-07-30 ENCOUNTER — Ambulatory Visit: Payer: Medicaid Other | Attending: Family | Admitting: Family

## 2017-07-30 ENCOUNTER — Encounter (HOSPITAL_COMMUNITY): Payer: Self-pay

## 2017-07-30 ENCOUNTER — Encounter: Payer: Self-pay | Admitting: Family

## 2017-07-30 ENCOUNTER — Other Ambulatory Visit (HOSPITAL_COMMUNITY): Payer: Self-pay

## 2017-07-30 VITALS — BP 155/94 | HR 75 | Resp 18 | Ht 64.0 in | Wt 253.2 lb

## 2017-07-30 DIAGNOSIS — N183 Chronic kidney disease, stage 3 (moderate): Secondary | ICD-10-CM | POA: Insufficient documentation

## 2017-07-30 DIAGNOSIS — J45909 Unspecified asthma, uncomplicated: Secondary | ICD-10-CM | POA: Insufficient documentation

## 2017-07-30 DIAGNOSIS — Z992 Dependence on renal dialysis: Secondary | ICD-10-CM | POA: Insufficient documentation

## 2017-07-30 DIAGNOSIS — E1122 Type 2 diabetes mellitus with diabetic chronic kidney disease: Secondary | ICD-10-CM | POA: Diagnosis not present

## 2017-07-30 DIAGNOSIS — Z79899 Other long term (current) drug therapy: Secondary | ICD-10-CM | POA: Insufficient documentation

## 2017-07-30 DIAGNOSIS — I130 Hypertensive heart and chronic kidney disease with heart failure and stage 1 through stage 4 chronic kidney disease, or unspecified chronic kidney disease: Secondary | ICD-10-CM | POA: Diagnosis present

## 2017-07-30 DIAGNOSIS — E1165 Type 2 diabetes mellitus with hyperglycemia: Secondary | ICD-10-CM | POA: Diagnosis not present

## 2017-07-30 DIAGNOSIS — J449 Chronic obstructive pulmonary disease, unspecified: Secondary | ICD-10-CM | POA: Insufficient documentation

## 2017-07-30 DIAGNOSIS — I5032 Chronic diastolic (congestive) heart failure: Secondary | ICD-10-CM

## 2017-07-30 DIAGNOSIS — Z9981 Dependence on supplemental oxygen: Secondary | ICD-10-CM | POA: Diagnosis not present

## 2017-07-30 DIAGNOSIS — D509 Iron deficiency anemia, unspecified: Secondary | ICD-10-CM | POA: Diagnosis not present

## 2017-07-30 DIAGNOSIS — I10 Essential (primary) hypertension: Secondary | ICD-10-CM

## 2017-07-30 NOTE — Patient Instructions (Addendum)
Continue weighing daily and call for an overnight weight gain of > 2 pounds or a weekly weight gain of >5 pounds.  Increase losartan to 2 tablets on your non-dialysis days. Dialysis days take 1 tablet daily.

## 2017-07-30 NOTE — Progress Notes (Signed)
Bp: 155/94, pulse: 78, resp: 18, spo2: 98% last week weight: 255 lbs, todays weight: 253 lbs  Todays visit with pt was at clinic.  Pt states feels good today.  Her birthday is tomorrow and we discussed foods she might be eating to celebrate.  We discussed crockpot foods and cooking different foods while waiting.  She has some swelling in ankle area on right and left.  Pt states she has cancelled home health and physical therapy at home due to visits are limited and she is afraid of running out due to insurance.  She states she will continue with exercises they gave her at home. [name] wants her Losarten to increase on none dialysis days to 100mg , stay at 50mg  on dialysis days.  Otila Kluver also discussed with pt about making a PCP appointment and discuss going back on her depression meds.  Will visit pt next week at home.  [place] [phone]

## 2017-07-30 NOTE — Progress Notes (Signed)
Patient ID: [name], female    DOB: [birth year], 47 y.o.   MRN: [id number]  HPI  [name] is a 47 y/o female with a history of DM, iron deficiency anemia, HTN, COPD with chronic oxygen use, asthma, obesity and chronic heart failure.   Echo report from 11/12/16 showed an EF of 55% along with mild MR. Reviewed echo done 06/27/16 which showed an EF of 60-65%. Previous echo was done 12/15/14 and showed an EF of 50-55% without any valvular regurgitation.   Procedure done 06/09/17 & 06/16/17 for fistula cannulation for dialysis. Admitted 03/24/17 due to acute on chronic HF. Renal function worsened so patient was started on dialysis. Cardiology, nephrology, vascular, psychiatry and palliative care consults were all obtained. AV fistula was placed. Discharged after 35 days to SNF for rehab. Admitted 03/03/17 due to acute on chronic HF. Cardiology & nephrology consults were obtained. Initially needed IV diuretics and then furosemide drip before transitioning to oral diuretics. Discharged home after 7 days. Admitted 01/07/17 due to pneumonia and pleural effusion. Treated with antibiotics and nebulizers. Elevated troponin thought to be due to demand ischemia. Discharged after 3 days.   She presents today for a follow-up visit with a chief complaint of shortness of breath. She's had shortness of breath, cough, fatigue, chest pain, abdominal distention, weakness,and subjective weight gain. She denies palpitations. She states that she has been up and walking some but that this causes her to become extremely SOB. She has lost a significant amount of weight ~ 150 lbs. She states that she feels much better having lost this weight. She has also been started on dialysis for her ESRD.    Past Medical History:  Diagnosis Date  . (HFpEF) heart failure with preserved ejection fraction ([place])    a. 11/2014 Echo: EF 50-55%, Gr1 DD; b. 06/2016 Echo: EF 60-65%, no rwma, mod dil LA, nl RV, triv eff.  . Asthma   . CHF (congestive  heart failure) ([place])   . CKD (chronic kidney disease), stage III ([place])   . COPD (chronic obstructive pulmonary disease) ([place])    a. Home O2.  . Diabetes mellitus without complication ([place])   . Hypertension   . Iron deficiency anemia    a. chronic blood loss->heavy menses.  . Morbid obesity ([place])   . Pericardial effusion    a. 11/2014 CT Chest: Mod effusion; b. 11/2014 Echo: small to mod circumferential effusion. No hemodynamic compromise.  [name] Poorly controlled diabetes mellitus ([place])    a. 11/2014 A1c 13.2.   Past Surgical History:  Procedure Laterality Date  . A/V FISTULAGRAM Left 06/09/2017   Procedure: A/V FISTULAGRAM;  Surgeon: Algernon Huxley, MD;  Location: [place] [facility];  Service: Cardiovascular;  Laterality: Left;  . A/V FISTULAGRAM Left 06/16/2017   Procedure: A/V FISTULAGRAM;  Surgeon: Algernon Huxley, MD;  Location: [place] [facility];  Service: Cardiovascular;  Laterality: Left;  . AV FISTULA PLACEMENT Left 04/25/2017   Procedure: ARTERIOVENOUS (AV) FISTULA CREATION;  Surgeon: Algernon Huxley, MD;  Location: [facility];  Service: Vascular;  Laterality: Left;  . CESAREAN SECTION     times 3  . DIALYSIS/PERMA CATHETER INSERTION Right 04/04/2017   Procedure: DIALYSIS/PERMA CATHETER INSERTION;  Surgeon: Katha Cabal, MD;  Location: [place] [facility];  Service: Cardiovascular;  Laterality: Right;  . INSERTION OF DIALYSIS CATHETER N/A 04/16/2017   Procedure: INSERTION OF DIALYSIS PERM CATHETER;  Surgeon: Algernon Huxley, MD;  Location: [facility];  Service: Vascular;  Laterality: N/A;   Family History  Problem Relation Age of Onset  . Diabetes Mother   . Hypertension Mother   . Hypertension Father   . Diabetes Father   . Hypertension Unknown   . Diabetes Unknown   . Breast cancer Maternal Aunt 55  . Breast cancer Maternal Aunt 38   Social History   Tobacco Use  . Smoking status: Never Smoker  . Smokeless tobacco: Never Used  Substance Use Topics  . Alcohol use: Yes     Alcohol/week: 0.0 oz    Comment: occasional   Allergies  Allergen Reactions  . Dust Mite Extract Shortness Of Breath and Itching  . Mold Extract [Trichophyton] Shortness Of Breath and Itching  . Feraheme [Ferumoxytol] Itching  . Pollen Extract Other (See Comments)    Unknown  . Sulfa Antibiotics Itching   Past Medical History:  Diagnosis Date  . (HFpEF) heart failure with preserved ejection fraction ([place])    a. 11/2014 Echo: EF 50-55%, Gr1 DD; b. 06/2016 Echo: EF 60-65%, no rwma, mod dil LA, nl RV, triv eff.  . Asthma   . CHF (congestive heart failure) ([place])   . CKD (chronic kidney disease), stage III ([place])   . COPD (chronic obstructive pulmonary disease) ([place])    a. Home O2.  . Diabetes mellitus without complication ([place])   . Hypertension   . Iron deficiency anemia    a. chronic blood loss->heavy menses.  . Morbid obesity ([place])   . Pericardial effusion    a. 11/2014 CT Chest: Mod effusion; b. 11/2014 Echo: small to mod circumferential effusion. No hemodynamic compromise.  [name] Poorly controlled diabetes mellitus ([place])    a. 11/2014 A1c 13.2.   Past Surgical History:  Procedure Laterality Date  . A/V FISTULAGRAM Left 06/09/2017   Procedure: A/V FISTULAGRAM;  Surgeon: Algernon Huxley, MD;  Location: [place] [facility];  Service: Cardiovascular;  Laterality: Left;  . A/V FISTULAGRAM Left 06/16/2017   Procedure: A/V FISTULAGRAM;  Surgeon: Algernon Huxley, MD;  Location: [place] [facility];  Service: Cardiovascular;  Laterality: Left;  . AV FISTULA PLACEMENT Left 04/25/2017   Procedure: ARTERIOVENOUS (AV) FISTULA CREATION;  Surgeon: Algernon Huxley, MD;  Location: [facility];  Service: Vascular;  Laterality: Left;  . CESAREAN SECTION     times 3  . DIALYSIS/PERMA CATHETER INSERTION Right 04/04/2017   Procedure: DIALYSIS/PERMA CATHETER INSERTION;  Surgeon: Katha Cabal, MD;  Location: [place] [facility];  Service: Cardiovascular;  Laterality: Right;  . INSERTION OF DIALYSIS CATHETER N/A  04/16/2017   Procedure: INSERTION OF DIALYSIS PERM CATHETER;  Surgeon: Algernon Huxley, MD;  Location: [facility];  Service: Vascular;  Laterality: N/A;   Family History  Problem Relation Age of Onset  . Diabetes Mother   . Hypertension Mother   . Hypertension Father   . Diabetes Father   . Hypertension Unknown   . Diabetes Unknown   . Breast cancer Maternal Aunt 61  . Breast cancer Maternal Aunt 3   Social History   Tobacco Use  . Smoking status: Never Smoker  . Smokeless tobacco: Never Used  Substance Use Topics  . Alcohol use: Yes    Alcohol/week: 0.0 oz    Comment: occasional   Allergies  Allergen Reactions  . Dust Mite Extract Shortness Of Breath and Itching  . Mold Extract [Trichophyton] Shortness Of Breath and Itching  . Feraheme [Ferumoxytol] Itching  . Pollen Extract Other (See Comments)    Unknown  .  Sulfa Antibiotics Itching   Prior to Admission medications   Medication Sig Start Date End Date Taking? Authorizing Provider  acetaminophen (TYLENOL) 500 MG tablet Take 1,000 mg by mouth every 6 (six) hours as needed for mild pain, fever or headache.   Yes [provider]  albuterol (PROVENTIL HFA;VENTOLIN HFA) 108 (90 Base) MCG/ACT inhaler Inhale 2 puffs into the lungs every 6 (six) hours as needed for wheezing or shortness of breath. 03/10/17  Yes Salary, Holly Bodily D, MD  albuterol (PROVENTIL) (2.5 MG/3ML) 0.083% nebulizer solution Take 2.5 mg by nebulization every 6 (six) hours as needed for wheezing or shortness of breath.   Yes [provider]  aspirin EC 81 MG tablet Take 81 mg by mouth daily.    Yes [provider]  atorvastatin (LIPITOR) 80 MG tablet Take 1 tablet (80 mg total) by mouth daily at 6 PM. 11/16/16  Yes Mody, Sital, MD  cefdinir (OMNICEF) 300 MG capsule Take 1 capsule (300 mg total) by mouth 2 (two) times daily. 03/10/17  Yes Salary, Avel Peace, MD  Cholecalciferol (HM VITAMIN D3) 4000 units CAPS Take 1 capsule by mouth once a week.    Yes [provider]  citalopram (CELEXA) 40 MG tablet Take 1 tablet (40 mg total) by mouth daily. 11/16/16  Yes Mody, Ulice Bold, MD  cloNIDine (CATAPRES) 0.2 MG tablet Take 1 tablet (0.2 mg total) by mouth 2 (two) times daily. 01/23/17  Yes Hackney, [name] A, FNP  guaiFENesin-dextromethorphan (ROBITUSSIN DM) 100-10 MG/5ML syrup Take 5 mLs every 4 (four) hours as needed by mouth for cough. 01/10/17  Yes Demetrios Loll, MD  hydrALAZINE (APRESOLINE) 100 MG tablet Take 1 tablet (100 mg total) by mouth every 8 (eight) hours. 11/16/16  Yes Mody, Ulice Bold, MD  insulin aspart (NOVOLOG FLEXPEN) 100 UNIT/ML FlexPen Inject 0-12 Units into the skin 3 (three) times daily with meals as needed for high blood sugar. Pt uses as needed per sliding scale. 11/16/16  Yes Mody, Sital, MD  ipratropium-albuterol (DUONEB) 0.5-2.5 (3) MG/3ML SOLN Take 3 mLs by nebulization every 6 (six) hours as needed. 03/10/17  Yes Salary, Avel Peace, MD  losartan (COZAAR) 50 MG tablet Take 50 mg daily by mouth.   Yes [provider]  metoprolol tartrate (LOPRESSOR) 100 MG tablet Take 1 tablet (100 mg total) by mouth 2 (two) times daily. 11/16/16  Yes Mody, Ulice Bold, MD  polyethylene glycol (MIRALAX / GLYCOLAX) packet Take 17 g by mouth daily as needed for mild constipation. 06/29/16  Yes Vaughan Basta, MD  potassium chloride (K-DUR) 10 MEQ tablet Take 1 tablet (10 mEq total) by mouth 2 (two) times daily. 11/16/16  Yes Bettey Costa, MD  torsemide (DEMADEX) 100 MG tablet Take 1 tablet (100 mg total) by mouth 2 (two) times daily. 03/10/17  Yes Salary, Avel Peace, MD    Review of Systems  Constitutional: Positive for appetite change (decreased) and fatigue.  HENT: Positive for congestion. Negative for postnasal drip and sinus pressure.   Eyes: Negative.   Respiratory: Positive for cough, shortness of breath and wheezing (at times). Negative for chest tightness.   Cardiovascular: Positive for chest pain. Negative for palpitations and leg  swelling.  Gastrointestinal: Positive for abdominal distention.  Endocrine: Negative.   Genitourinary: Negative.   Musculoskeletal: Negative for back pain.  Skin: Negative.   Allergic/Immunologic: Negative.   Neurological: Positive for weakness and light-headedness. Negative for dizziness.  Hematological: Negative for adenopathy. Does not bruise/bleed easily.  Psychiatric/Behavioral: Positive for dysphoric mood. Negative for  sleep disturbance (wearing oxygen at 2L) and suicidal ideas. The patient is nervous/anxious.    Vitals:   07/30/17 1340  BP: (!) 155/94  Pulse: 75  Resp: 18  SpO2: 98%  Weight: 253 lb 4 oz (114.9 kg)  Height: 5\' 4"  (1.626 m)   Wt Readings from Last 3 Encounters:  07/30/17 253 lb 4 oz (114.9 kg)  07/23/17 255 lb (115.7 kg)  07/16/17 256 lb (116.1 kg)   Lab Results  Component Value Date   CREATININE 3.97 (H) 04/26/2017   CREATININE 3.93 (H) 04/21/2017   CREATININE 4.30 (H) 04/18/2017    Physical Exam  Constitutional: She is oriented to person, place, and time. She appears well-developed and well-nourished.  HENT:  Head: Normocephalic and atraumatic.  Neck: Normal range of motion. Neck supple. No JVD present.  Cardiovascular: Normal rate and regular rhythm.  Pulmonary/Chest: Effort normal. She has no wheezes. She has no rales.  Difficult to assess due to body girth   Abdominal: She exhibits no distension. There is no tenderness.  Musculoskeletal: She exhibits no edema or tenderness.  Neurological: She is alert and oriented to person, place, and time.  Skin: Skin is warm and dry.  Psychiatric: She has a normal mood and affect. Her behavior is normal. Thought content normal.  Nursing note and vitals reviewed.   Assessment & Plan:  1: Chronic heart failure with preserved ejection fraction- - NYHA class III - patient is weighing daily. Re-stated importance of calling clinic if 2 lb weight gain overnight or 5 lb weight gain in a week.  - taking 100mg   torsemide BID and says that she hasn't missed any doses  - not adding salt and trying to follow a 2000mg  sodium diet; says that her husband is cooking everything fresh - says that she's drinking ~ 1.5 hospital cups of fluid daily - last saw cardiologist Rockey Situ) 12/25/15.  - ECHO 10/2016 LVEF 55% - no longer followed by home health but EMT is coming to house once a week. Her husband helps her with her medications  - BNP 03/27/17 was 577 - BMP 04/26/17 shows sodium 131, potassium 5 and GFR 13/SCr 3.97 - Dialysis on Tuesday, Thursday, Saturday - followed by Dr. Ashby Dawes last visit 06/11/17  2. HTN-  - BP elevated today at appoinment - patient states that BP at home is typically in 160's but at dialysis can drop to 130's - will change losartan prescription to 100mg  on non-dialysis days and 50mg  on dialysis days   Return on 08/27/17 or sooner if any questions/problems arise.   Lendon Ka, PharmD Pharmacy Resident

## 2017-07-31 ENCOUNTER — Encounter: Payer: Self-pay | Admitting: Family

## 2017-08-06 ENCOUNTER — Encounter (HOSPITAL_COMMUNITY): Payer: Self-pay

## 2017-08-06 ENCOUNTER — Other Ambulatory Visit (HOSPITAL_COMMUNITY): Payer: Self-pay

## 2017-08-06 NOTE — Progress Notes (Signed)
Bp: 140/86, pulse: 71, resp: 20, spo2: 95%, bgl: 148, last weight: 253, todays 242  Today visit with pt she states she is ok.  She states the humidity is acting her asthma up, she has took extra neb treatment today.  She states celebrated her birthday and went to a baby shower, she states she had some weight gain because of what she ate but now come back down.  She has very little swelling in feet and ankles.  Denies tightness in abdominal area.  Lungs are clear.  She states taking her meds, husband gives her meds to her.  Pt states taking extra 50 mg Losartan on non dialysis days.  She has not missed her dialysis appts. Meds were verified.  Denis dizziness or headaches.  Pt states been doing her exercises that physical therapy had showed her.  Pt is dong well, she is walking without her walker more around the house.  Will discuss with patient about switching to every 2 weeks next week.  Will visit next week.   Nile Dear [place] [facility] [place] [phone]

## 2017-08-13 ENCOUNTER — Encounter (HOSPITAL_COMMUNITY): Payer: Self-pay

## 2017-08-13 ENCOUNTER — Other Ambulatory Visit (HOSPITAL_COMMUNITY): Payer: Self-pay

## 2017-08-13 NOTE — Progress Notes (Deleted)
[facility]    Synopsis: 47 year old female with morbid obesity, diastolic congestive heart failure, asthma/COPD with chronic hypoxic and hypercapnic respiratory failure, complicated by chronic kidney disease..   Assessment and Plan:  Asthma with chronic bronchitis.  -Patient is asked to use continue using nebulized duo nebs as needed, continue Symbicort. -Suspect an allergic component.  We will obtain results of her allergy testing done a few years ago at [facility].  -She is asked to start an antihistamine once daily.  Dyspnea with chronic hypoxic respiratory failure. -I suspect a the major driver of her dyspnea is morbid obesity, restrictive lung disease related to obesity, and pulmonary restriction from cardiomegaly.  Morbid obesity likely with OHS; BMI=61 was restrictive lung disease. --We tried to see if she could be started on Bipap but insurance not approved. In addition when she was tried in the hospital she did not tolerate it.   Deconditioning. -Likely contributing to dyspnea. -We referred the patient back to pulmonary rehabilitation.  Chronic diastolic congestive heart failure with cardiomegaly, history of pericardial effusion. -Continue to follow up with cardiology.  Date: 08/13/2017  MRN# [id number] [name] [birth year]    [name] is a 47 y.o. old female seen in consultation for chief complaint of:    No chief complaint on file.   HPI:   47 yo morbidly obese woman with uncontrolled diabetes with history of chronic diastolic CHF with numerous admission for CHF. She also has allergic asthma, morbid obesity with likely OHS. She does not tolerate bipap and it was not covered by insurance. At last visit she was asked to continue symbicort, antihistamine, duonebs.  Most recent hospitalizations>>September 2018, November 2018, January 2019, March 2019.   She was recently discharged about a month ago for ARF, CHF, volume overload, she is  now on HD. She went to rehab for a few week, she is doing better, her strength has improved but not back to baseline. She is using proair 4 times per day, seems to help, she uses nebs also. Currently she is also on symbicort, 2 puffs bid and rinses mouth.  She has 2 dogs, not in bedroom.  She takes cetirizine occasionally. She has had allergy testing in the past and was allergic to dust, grass, ragweed and others, she thinks the test was done at [facility] about 5 years ago.   She has been referred to pulmonary rehab in the past but but did not follow-up.  **IgE 06/11/17>>249. **Echocardiogram 12/15/14>> EF equals 55%, mild left atrial dilation, small to moderate pericardial effusion, no TR noted. **Abs eosinophils count as high as 300 on 01/03/15.   Medication:   Reviewed.     Allergies:  Dust mite extract; Mold extract [trichophyton]; Feraheme [ferumoxytol]; Pollen extract; and Sulfa antibiotics  Review of Systems:   Physical Examination:   VS: LMP 09/06/2016 (Approximate)    Other findings:    LABORATORY PANEL:   CBC No results for input(s): WBC, HGB, HCT, PLT in the last 168 hours. ------------------------------------------------------------------------------------------------------------------  Chemistries  No results for input(s): NA, K, CL, CO2, GLUCOSE, BUN, CREATININE, CALCIUM, MG, AST, ALT, ALKPHOS, BILITOT in the last 168 hours.  Invalid input(s): GFRCGP ------------------------------------------------------------------------------------------------------------------  Cardiac Enzymes No results for input(s): TROPONINI in the last 168 hours. ------------------------------------------------------------  RADIOLOGY:  No results found.     Thank  you for the consultation and for allowing [facility] to assist in the care of your patient. Our recommendations are noted above.  Please contact us  if we can be of further service.   Marda Stalker, MD.  Board Certified in Internal Medicine, Pulmonary Medicine, [place], and Sleep Medicine.  [facility] Office Number: [phone]  Patricia Pesa, M.D.  Merton Border, M.D  08/13/2017

## 2017-08-13 NOTE — Progress Notes (Signed)
Bp: 150/82, pulse: 74, resp: 20, sop2: 95, last weight 242. Today 250. Bgl: 395  Pt today states she feels good.  She states her diet been off a little.  She has no swelling in lower extremities, she states her abdomen does not feel tight.  Lungs sounds are clear.  She has been meeting her dialysis appts.  She has a weight gain this week of 8 lbs.  She states been to a few restaurants out to eat for celebrations and a baby shower.  She denies shortness of breath at rest and breathing is better with activity.  She wears her oxygen at all times.  She is continuing being active and doing exercises that physical therapy had given her.  Pt meds verified, her husband gives her meds to her.  She states her blood sugar has been ok, today she has just eaten.  She states she will check it again later.  Will visit pt in 2 weeks.   [place] [phone]

## 2017-08-19 ENCOUNTER — Telehealth: Payer: Self-pay | Admitting: *Deleted

## 2017-08-19 NOTE — Telephone Encounter (Signed)
Called patient to make aware she needs appt for poc/02 eval. Appt already scheduled.

## 2017-08-19 NOTE — Progress Notes (Addendum)
[name]    Synopsis: 47 year old female with morbid obesity, diastolic congestive heart failure, asthma/COPD with chronic hypoxic and hypercapnic respiratory failure, complicated by chronic kidney disease..   Assessment and Plan:  Asthma with chronic bronchitis.  -Patient is asked to use continue using nebulized duo nebs as needed, patient requested change from Symbicort to [facility], will order, though I am not sure if this will be covered, if not she request to be changed to Advair. -Suspect an allergic component.  We will again attempt to obtain allergy testing done a few years ago at [facility].  -She is asked to start an antihistamine once daily.  Dyspnea with chronic hypoxic respiratory failure. -I suspect a the major driver of her dyspnea is morbid obesity, restrictive lung disease related to obesity, and pulmonary restriction from cardiomegaly.  Morbid obesity likely with OHS; BMI=61 was restrictive lung disease. --We tried to see if she could be started on Bipap but insurance not approved. In addition when she was tried in the hospital she did not tolerate it.   Deconditioning. -Likely contributing to dyspnea. -We referred the patient back to pulmonary rehabilitation.  Chronic diastolic congestive heart failure with cardiomegaly, history of pericardial effusion. -Continue to follow up with cardiology.  Chronic hypoxic respiratory failure. -Patient completed a walk test today, she required 0 L at rest, 4 L with activity.  Date: 08/19/2017  MRN# [id number] [name] 1970/12/04    [name] is a 47 y.o. old female seen in consultation for chief complaint of:    Chief Complaint  Patient presents with  . Follow-up    asthma:     HPI:   47 yo morbidly obese woman with uncontrolled diabetes with history of chronic diastolic CHF with numerous admission for CHF. She also has allergic asthma, morbid obesity with likely OHS. She does not  tolerate bipap and it was not covered by insurance.  Most recent hospitalizations>>September 2018, November 2018, January 2019, March 2019.   Since last visit she feels that she does not like the way she feels with symbicort, she did better with dulera. She has declined singulair because wants to try to reduce her medications.   She has 2 dogs, not in bedroom.  She takes cetirizine occasionally. She has had allergy testing in the past and was allergic to dust, grass, ragweed and others, she thinks the test was done at [facility] about 5 years ago.   She has been referred to pulmonary rehab in the past but but did not follow-up.  **Walk test 08/20/2017>> required 0 L at rest, 4 L with activity. **IgE 06/11/17>>249. **Echocardiogram 12/15/14>> EF equals 55%, mild left atrial dilation, small to moderate pericardial effusion, no TR noted. **Abs eosinophils count as high as 300 on 01/03/15.   Addendum 09/02/2017 **Review of outside allergy testing from 12/05/2011; positive allergies to multiple perennial aeroallergens including ragweed, dust mite, cockroach, cat hair, dog, Alternaria  Medication:   Reviewed.     Allergies:  Dust mite extract; Mold extract [trichophyton]; Feraheme [ferumoxytol]; Pollen extract; and Sulfa antibiotics  Review of Systems:   Physical Examination:   VS: LMP 09/06/2016 (Approximate)    Other findings:    LABORATORY PANEL:   CBC No results for input(s): WBC, HGB, HCT, PLT in the last 168 hours. ------------------------------------------------------------------------------------------------------------------  Chemistries  No results for input(s): NA, K, CL, CO2, GLUCOSE, BUN, CREATININE, CALCIUM, MG, AST, ALT, ALKPHOS, BILITOT in the last 168 hours.  Invalid input(s): GFRCGP ------------------------------------------------------------------------------------------------------------------  Cardiac Enzymes No  results for input(s): TROPONINI in the last  168 hours. ------------------------------------------------------------  RADIOLOGY:  No results found.     Thank  you for the consultation and for allowing [facility] to assist in the care of your patient. Our recommendations are noted above.  Please contact us if we can be of further service.   Marda Stalker, MD.  Board Certified in Internal [name], Pulmonary [name], [place], and Sleep [name].  [facility] Office Number: [phone]  Patricia Pesa, M.D.  Merton Border, M.D  08/19/2017

## 2017-08-20 ENCOUNTER — Ambulatory Visit: Payer: Medicaid Other | Admitting: Internal Medicine

## 2017-08-20 ENCOUNTER — Encounter: Payer: Self-pay | Admitting: Internal Medicine

## 2017-08-20 ENCOUNTER — Ambulatory Visit (INDEPENDENT_AMBULATORY_CARE_PROVIDER_SITE_OTHER): Payer: Medicaid Other | Admitting: Internal Medicine

## 2017-08-20 VITALS — BP 118/76 | HR 80 | Resp 16 | Ht 64.0 in | Wt 250.0 lb

## 2017-08-20 DIAGNOSIS — J9611 Chronic respiratory failure with hypoxia: Secondary | ICD-10-CM | POA: Diagnosis not present

## 2017-08-20 DIAGNOSIS — J4551 Severe persistent asthma with (acute) exacerbation: Secondary | ICD-10-CM

## 2017-08-20 DIAGNOSIS — J984 Other disorders of lung: Secondary | ICD-10-CM | POA: Diagnosis not present

## 2017-08-20 MED ORDER — MOMETASONE FURO-FORMOTEROL FUM 200-5 MCG/ACT IN AERO: 2.0000 | INHALATION_SPRAY | Freq: Two times a day (BID) | RESPIRATORY_TRACT | 5 refills | Status: DC

## 2017-08-20 NOTE — Patient Instructions (Signed)
Will switch symbicort to dulera or advair (whichever is covered).

## 2017-08-21 ENCOUNTER — Ambulatory Visit: Payer: Medicaid Other | Admitting: Internal Medicine

## 2017-08-27 ENCOUNTER — Encounter: Payer: Self-pay | Admitting: Family

## 2017-08-27 ENCOUNTER — Encounter (HOSPITAL_COMMUNITY): Payer: Self-pay

## 2017-08-27 ENCOUNTER — Ambulatory Visit: Payer: Medicaid Other | Attending: Family | Admitting: Family

## 2017-08-27 ENCOUNTER — Other Ambulatory Visit (HOSPITAL_COMMUNITY): Payer: Self-pay

## 2017-08-27 VITALS — BP 117/77 | HR 82 | Resp 18 | Ht 64.0 in | Wt 250.5 lb

## 2017-08-27 DIAGNOSIS — Z9981 Dependence on supplemental oxygen: Secondary | ICD-10-CM | POA: Diagnosis not present

## 2017-08-27 DIAGNOSIS — D509 Iron deficiency anemia, unspecified: Secondary | ICD-10-CM | POA: Insufficient documentation

## 2017-08-27 DIAGNOSIS — N183 Chronic kidney disease, stage 3 unspecified: Secondary | ICD-10-CM

## 2017-08-27 DIAGNOSIS — J449 Chronic obstructive pulmonary disease, unspecified: Secondary | ICD-10-CM | POA: Insufficient documentation

## 2017-08-27 DIAGNOSIS — Z7982 Long term (current) use of aspirin: Secondary | ICD-10-CM | POA: Diagnosis not present

## 2017-08-27 DIAGNOSIS — Z794 Long term (current) use of insulin: Secondary | ICD-10-CM | POA: Insufficient documentation

## 2017-08-27 DIAGNOSIS — Z8249 Family history of ischemic heart disease and other diseases of the circulatory system: Secondary | ICD-10-CM | POA: Insufficient documentation

## 2017-08-27 DIAGNOSIS — Z79899 Other long term (current) drug therapy: Secondary | ICD-10-CM | POA: Insufficient documentation

## 2017-08-27 DIAGNOSIS — E1122 Type 2 diabetes mellitus with diabetic chronic kidney disease: Secondary | ICD-10-CM | POA: Diagnosis not present

## 2017-08-27 DIAGNOSIS — Z882 Allergy status to sulfonamides status: Secondary | ICD-10-CM | POA: Insufficient documentation

## 2017-08-27 DIAGNOSIS — R5383 Other fatigue: Secondary | ICD-10-CM | POA: Insufficient documentation

## 2017-08-27 DIAGNOSIS — I5032 Chronic diastolic (congestive) heart failure: Secondary | ICD-10-CM | POA: Insufficient documentation

## 2017-08-27 DIAGNOSIS — I10 Essential (primary) hypertension: Secondary | ICD-10-CM

## 2017-08-27 DIAGNOSIS — I130 Hypertensive heart and chronic kidney disease with heart failure and stage 1 through stage 4 chronic kidney disease, or unspecified chronic kidney disease: Secondary | ICD-10-CM | POA: Insufficient documentation

## 2017-08-27 DIAGNOSIS — Z992 Dependence on renal dialysis: Secondary | ICD-10-CM | POA: Diagnosis not present

## 2017-08-27 NOTE — Patient Instructions (Signed)
Continue weighing daily and call for an overnight weight gain of > 2 pounds or a weekly weight gain of >5 pounds. 

## 2017-08-27 NOTE — Progress Notes (Signed)
Patient ID: Tinya Cadogan, female    DOB: 1971-01-13, 47 y.o.   MRN: [id number]  HPI  Ms Capano is a 47 y/o female with a history of DM, iron deficiency anemia, HTN, COPD with chronic oxygen use, asthma, obesity and chronic heart failure.   Echo report from 11/12/16 showed an EF of 55% along with mild MR. Reviewed echo done 06/27/16 which showed an EF of 60-65%. Previous echo was done 12/15/14 and showed an EF of 50-55% without any valvular regurgitation.   Procedure done 06/09/17 & 06/16/17 for fistula cannulation for dialysis. Admitted 03/24/17 due to acute on chronic HF. Renal function worsened so patient was started on dialysis. Cardiology, nephrology, vascular, psychiatry and palliative care consults were all obtained. AV fistula was placed. Discharged after 35 days to SNF for rehab. Admitted 03/03/17 due to acute on chronic HF. Cardiology & nephrology consults were obtained. Initially needed IV diuretics and then furosemide drip before transitioning to oral diuretics. Discharged home after 7 days.   She presents today for a follow-up visit with a chief complaint of moderate fatigue upon minimal exertion. She describes this as chronic in nature having been present for several years. She has associated cough, shortness of breath, wheezing, light-headedness, weakness, chest pain and anxiety along with this. She denies any difficulty sleeping, abdominal distention, palpitations, leg swelling or weight gain. Continues to have dialysis Tues, Thurs, Sat.   Past Medical History:  Diagnosis Date  . (HFpEF) heart failure with preserved ejection fraction ([place])    a. 11/2014 Echo: EF 50-55%, Gr1 DD; b. 06/2016 Echo: EF 60-65%, no rwma, mod dil LA, nl RV, triv eff.  . Asthma   . CHF (congestive heart failure) ([place])   . CKD (chronic kidney disease), stage III ([place])   . COPD (chronic obstructive pulmonary disease) ([place])    a. Home O2.  . Diabetes mellitus without complication ([place])   . Hypertension   . Iron  deficiency anemia    a. chronic blood loss->heavy menses.  . Morbid obesity ([place])   . Pericardial effusion    a. 11/2014 CT Chest: Mod effusion; b. 11/2014 Echo: small to mod circumferential effusion. No hemodynamic compromise.  [name] Poorly controlled diabetes mellitus ([place])    a. 11/2014 A1c 13.2.   Past Surgical History:  Procedure Laterality Date  . A/V FISTULAGRAM Left 06/09/2017   Procedure: A/V FISTULAGRAM;  Surgeon: Algernon Huxley, MD;  Location: [place] [facility];  Service: Cardiovascular;  Laterality: Left;  . A/V FISTULAGRAM Left 06/16/2017   Procedure: A/V FISTULAGRAM;  Surgeon: Algernon Huxley, MD;  Location: [place] [facility];  Service: Cardiovascular;  Laterality: Left;  . AV FISTULA PLACEMENT Left 04/25/2017   Procedure: ARTERIOVENOUS (AV) FISTULA CREATION;  Surgeon: Algernon Huxley, MD;  Location: [facility];  Service: Vascular;  Laterality: Left;  . CESAREAN SECTION     times 3  . DIALYSIS/PERMA CATHETER INSERTION Right 04/04/2017   Procedure: DIALYSIS/PERMA CATHETER INSERTION;  Surgeon: Katha Cabal, MD;  Location: [place] [facility];  Service: Cardiovascular;  Laterality: Right;  . INSERTION OF DIALYSIS CATHETER N/A 04/16/2017   Procedure: INSERTION OF DIALYSIS PERM CATHETER;  Surgeon: Algernon Huxley, MD;  Location: [facility];  Service: Vascular;  Laterality: N/A;   Family History  Problem Relation Age of Onset  . Diabetes Mother   . Hypertension Mother   . Hypertension Father   . Diabetes Father   . Hypertension Unknown   . Diabetes Unknown   .  Breast cancer Maternal Aunt 68  . Breast cancer Maternal Aunt 101   Social History   Tobacco Use  . Smoking status: Never Smoker  . Smokeless tobacco: Never Used  Substance Use Topics  . Alcohol use: Yes    Alcohol/week: 0.0 oz    Comment: occasional   Allergies  Allergen Reactions  . Dust Mite Extract Shortness Of Breath and Itching  . Mold Extract [Trichophyton] Shortness Of Breath and Itching  . Feraheme  [Ferumoxytol] Itching  . Pollen Extract Other (See Comments)    Unknown  . Sulfa Antibiotics Itching   Past Medical History:  Diagnosis Date  . (HFpEF) heart failure with preserved ejection fraction ([place])    a. 11/2014 Echo: EF 50-55%, Gr1 DD; b. 06/2016 Echo: EF 60-65%, no rwma, mod dil LA, nl RV, triv eff.  . Asthma   . CHF (congestive heart failure) ([place])   . CKD (chronic kidney disease), stage III ([place])   . COPD (chronic obstructive pulmonary disease) ([place])    a. Home O2.  . Diabetes mellitus without complication ([place])   . Hypertension   . Iron deficiency anemia    a. chronic blood loss->heavy menses.  . Morbid obesity ([place])   . Pericardial effusion    a. 11/2014 CT Chest: Mod effusion; b. 11/2014 Echo: small to mod circumferential effusion. No hemodynamic compromise.  [name] Poorly controlled diabetes mellitus ([place])    a. 11/2014 A1c 13.2.   Past Surgical History:  Procedure Laterality Date  . A/V FISTULAGRAM Left 06/09/2017   Procedure: A/V FISTULAGRAM;  Surgeon: Algernon Huxley, MD;  Location: [place];  Service: Cardiovascular;  Laterality: Left;  . A/V FISTULAGRAM Left 06/16/2017   Procedure: A/V FISTULAGRAM;  Surgeon: Algernon Huxley, MD;  Location: [place] [facility];  Service: Cardiovascular;  Laterality: Left;  . AV FISTULA PLACEMENT Left 04/25/2017   Procedure: ARTERIOVENOUS (AV) FISTULA CREATION;  Surgeon: Algernon Huxley, MD;  Location: [facility];  Service: Vascular;  Laterality: Left;  . CESAREAN SECTION     times 3  . DIALYSIS/PERMA CATHETER INSERTION Right 04/04/2017   Procedure: DIALYSIS/PERMA CATHETER INSERTION;  Surgeon: Katha Cabal, MD;  Location: [facility];  Service: Cardiovascular;  Laterality: Right;  . INSERTION OF DIALYSIS CATHETER N/A 04/16/2017   Procedure: INSERTION OF DIALYSIS PERM CATHETER;  Surgeon: Algernon Huxley, MD;  Location: [facility];  Service: Vascular;  Laterality: N/A;   Family History  Problem Relation Age of Onset  . Diabetes  Mother   . Hypertension Mother   . Hypertension Father   . Diabetes Father   . Hypertension Unknown   . Diabetes Unknown   . Breast cancer Maternal Aunt 18  . Breast cancer Maternal Aunt 16   Social History   Tobacco Use  . Smoking status: Never Smoker  . Smokeless tobacco: Never Used  Substance Use Topics  . Alcohol use: Yes    Alcohol/week: 0.0 oz    Comment: occasional   Allergies  Allergen Reactions  . Dust Mite Extract Shortness Of Breath and Itching  . Mold Extract [Trichophyton] Shortness Of Breath and Itching  . Feraheme [Ferumoxytol] Itching  . Pollen Extract Other (See Comments)    Unknown  . Sulfa Antibiotics Itching   Prior to Admission medications   Medication Sig Start Date End Date Taking? Authorizing Provider  acetaminophen (TYLENOL) 500 MG tablet Take 1,000 mg by mouth every 6 (six) hours as needed for mild pain, fever or headache.  Yes [provider]  albuterol (PROVENTIL HFA;VENTOLIN HFA) 108 (90 Base) MCG/ACT inhaler Inhale 2 puffs into the lungs every 6 (six) hours as needed for wheezing or shortness of breath. 03/10/17  Yes Salary, Holly Bodily D, MD  albuterol (PROVENTIL) (2.5 MG/3ML) 0.083% nebulizer solution Take 2.5 mg by nebulization every 6 (six) hours as needed for wheezing or shortness of breath.   Yes [provider]  aspirin EC 81 MG tablet Take 81 mg by mouth daily.    Yes [provider]  b complex-vitamin c-folic acid (NEPHRO-VITE) 0.8 MG TABS tablet Take 1 tablet by mouth at bedtime.   Yes [provider]  budesonide-formoterol (SYMBICORT) 160-4.5 MCG/ACT inhaler Inhale 2 puffs into the lungs 2 (two) times daily.   Yes [provider]  Cholecalciferol (HM VITAMIN D3) 4000 units CAPS Take 1 capsule by mouth once a week.   Yes [provider]  cloNIDine (CATAPRES) 0.2 MG tablet Take 1 tablet (0.2 mg total) by mouth 2 (two) times daily. 01/23/17  Yes Nevia Henkin, Otila Kluver A, FNP  docusate sodium (COLACE)  100 MG capsule Take 100 mg by mouth daily.   Yes [provider]  ferric citrate (AURYXIA) 1 GM 210 MG(Fe) tablet Take 210 mg by mouth 3 (three) times daily with meals.   Yes [provider]  guaiFENesin-dextromethorphan (ROBITUSSIN DM) 100-10 MG/5ML syrup Take 5 mLs every 4 (four) hours as needed by mouth for cough. 01/10/17  Yes Demetrios Loll, MD  insulin aspart (NOVOLOG) 100 UNIT/ML injection Inject 0-5 Units into the skin at bedtime. 04/26/17  Yes Epifanio Lesches, MD  insulin aspart (NOVOLOG) 100 UNIT/ML injection Inject 0-15 Units into the skin 3 (three) times daily with meals. 04/26/17  Yes Epifanio Lesches, MD  insulin degludec (TRESIBA) 100 UNIT/ML SOPN FlexTouch Pen Inject 38 Units into the skin daily at 10 pm.   Yes [provider]  ipratropium-albuterol (DUONEB) 0.5-2.5 (3) MG/3ML SOLN Take 3 mLs by nebulization every 6 (six) hours as needed. 03/10/17  Yes Salary, Avel Peace, MD  lactulose (CHRONULAC) 10 GM/15ML solution Take 30 mLs (20 g total) by mouth daily as needed for severe constipation. 04/26/17  Yes Epifanio Lesches, MD  lidocaine-prilocaine (EMLA) cream Apply topically as needed (For access for dialysis).   Yes [provider]  losartan (COZAAR) 50 MG tablet Take 50 mg by mouth 2 (two) times daily. On dialysis days, patient is taking in the evening   Yes [provider]  metoprolol tartrate (LOPRESSOR) 100 MG tablet Take 1 tablet (100 mg total) by mouth 2 (two) times daily. 11/16/16  Yes Mody, Ulice Bold, MD  mometasone-formoterol (DULERA) 200-5 MCG/ACT AERO Inhale 2 puffs into the lungs 2 (two) times daily. Rinse mouth after use. 08/20/17  Yes Laverle Hobby, MD  OXYGEN Inhale 2-4 L into the lungs.   Yes [provider]  polyethylene glycol (MIRALAX / GLYCOLAX) packet Take 17 g by mouth daily as needed for mild constipation. 06/29/16  Yes Vaughan Basta, MD  torsemide (DEMADEX) 100 MG tablet Take 1 tablet (100 mg total)  by mouth 2 (two) times daily. 03/10/17  Yes Salary, Avel Peace, MD   Review of Systems  Constitutional: Positive for appetite change (decreased) and fatigue.  HENT: Positive for congestion. Negative for sinus pressure.   Eyes: Negative.   Respiratory: Positive for cough (loose cough), shortness of breath and wheezing (at times). Negative for chest tightness.   Cardiovascular: Positive for chest pain. Negative for leg swelling.  Gastrointestinal: Negative for abdominal  distention and abdominal pain.  Endocrine: Negative.   Genitourinary: Negative.   Musculoskeletal: Negative for back pain.  Skin: Negative.   Allergic/Immunologic: Negative.   Neurological: Positive for weakness and light-headedness. Negative for dizziness.  Hematological: Negative for adenopathy. Does not bruise/bleed easily.  Psychiatric/Behavioral: Positive for dysphoric mood. Negative for sleep disturbance (wearing oxygen at 2L) and suicidal ideas. The patient is nervous/anxious.    Vitals:   08/27/17 1139  BP: 117/77  Pulse: 82  Resp: 18  SpO2: 97%  Weight: 250 lb 8 oz (113.6 kg)  Height: 5\' 4"  (1.626 m)   Wt Readings from Last 3 Encounters:  08/27/17 250 lb 8 oz (113.6 kg)  08/20/17 250 lb (113.4 kg)  08/13/17 250 lb (113.4 kg)   Lab Results  Component Value Date   CREATININE 3.97 (H) 04/26/2017   CREATININE 3.93 (H) 04/21/2017   CREATININE 4.30 (H) 04/18/2017    Physical Exam  Constitutional: She is oriented to person, place, and time. She appears well-developed and well-nourished.  HENT:  Head: Normocephalic and atraumatic.  Neck: Normal range of motion. Neck supple. No JVD present.  Cardiovascular: Normal rate and regular rhythm.  Pulmonary/Chest: Effort normal. She has no wheezes. She has rhonchi (clears with cough) in the right lower field, the left upper field and the left lower field. She has no rales.  Abdominal: She exhibits no distension. There is no tenderness.  Musculoskeletal: She exhibits  no edema or tenderness.  Neurological: She is alert and oriented to person, place, and time.  Skin: Skin is warm and dry.  Psychiatric: She has a normal mood and affect. Her behavior is normal. Thought content normal.  Nursing note and vitals reviewed.   Assessment & Plan:  1: Chronic heart failure with preserved ejection fraction- - NYHA class III - patient is weighing daily. Reminded to call for 2 lb weight gain overnight or 5 lb weight gain in a week.  - taking 100mg  torsemide BID and says that she hasn't missed any doses  - not adding salt and trying to follow a 2000mg  sodium diet; says that her husband is cooking everything fresh - says that she's drinking ~ 1.5 hospital cups of fluid daily - last saw cardiologist Rockey Situ) 12/25/15.  - no longer followed by home health but EMT is coming to house once a week. Her husband helps her with her medications  - BNP 03/27/17 was 577 - BMP 04/26/17 shows sodium 131, potassium 5 and GFR 13/SCr 3.97 - Dialysis on Tuesday, Thursday, Saturday - followed by Dr. Ashby Dawes last visit 08/20/17  2. HTN-  - BP looks good today - taking losartan as 100mg  on non-dialysis days and 50mg  on dialysis days   3: Diabetes-  - nonfasting glucose at home this morning was 240 - she had eaten eggs with cheese, 2 pieces of french toast sausage and 1 piece of toast  Patient did not bring her medications nor a list. Each medication was verbally reviewed with the patient and she was encouraged to bring the bottles to every visit to confirm accuracy of list.  Return in 4 months or sooner for any questions/problems before then.

## 2017-08-27 NOTE — Progress Notes (Signed)
Bp: 117/77, pulse: 82, resp: 18, spo2: 97, weight 250 lbs  Today visit with patient at Heart Failure clinic.  Pt states this hot and humid air has affected her breathing.  She states she has been walking a lot more, still using wheel chair going out.  She states been walking more for exercise due to her back has been hurting.  Talked about diet and watching her salt and sugars, she states ate a lot of pasta and zuchinni bread last week.  Swelling in legs look good, she states her weight has been maintaining.  She has been making her dialysis appts.  She has started crock pot meals and clinic today gave her a cook book with low salt recipes.  She states ran out of her night time insulin for 4 days but now back on it. She states her sugars has been around 200's. She states she is picking up meds today and she will have them all.  She has some fluid in lower right lung but clears when cough.  [name] advised to listen to her next week and make sure sounds good.  Meds verified with pt.  Will visit pt next week.   [place] [phone]

## 2017-09-02 ENCOUNTER — Other Ambulatory Visit (HOSPITAL_COMMUNITY): Payer: Self-pay

## 2017-09-02 NOTE — Progress Notes (Signed)
Bp: 148/90, pulse: 74, resp: 20, spo2: 97 last weight: 250, todays 256  Today patient states feels good.  Lungs have some congestion but clears when cough.  Pt states cough is getting better, she just feels the rattle.  She states breathing appears to be better.  She is walking with walker and without more than sitting in wheel chair.  She states doing some exercises that physical therapy had left her.  She has very little swelling in extremities, she denies abdomen feeling tight.  She denies chest pain, dizziness or headaches.  We discussed her weight going up, does not appear to be fluid, patient states she has not been watching her diet as close.  We talked about her keep moving forward, she has come a long ways in past 6 months, advised we will work more on foods and get her some recipes she might like.  She has been going to her dialysis appts.  Asked her to continue to weigh everyday and mark it down.  Asked about if a food journal may help, she refused.  She has been trying more vegetables but ate meat and gravy and some other high fat and sodium foods in past week.  Will keep educating pt on foods.  Will visit patient next week. Meds were verified, husband gives patient her meds and he is familiar with her meds.  [place] [phone]

## 2017-09-10 ENCOUNTER — Encounter (HOSPITAL_COMMUNITY): Payer: Self-pay

## 2017-09-10 ENCOUNTER — Other Ambulatory Visit (HOSPITAL_COMMUNITY): Payer: Self-pay

## 2017-09-10 NOTE — Progress Notes (Signed)
Bp: 132/84, pulse: 74, resp: 18, spo2: 96% todays weight: 247 lbs, last weight: 250  Today pt states feels good.  She has been out driving a car and getting around much better.  She has got rid of wheel chair and using a cane or walker.  She states watching her salt intake, she has not missed her dialysis appts.  She denies short of breath being still, still shortness of breath with activity but not as bad.  Lungs clear from fluid, pt has some wheezes on left lower, she states time for breathing treatment.  Pt denies headaches or dizziness.  No extremity swelling, abdomen not tight.  Pt states getting better and better.  Gave pt some recipes for crock pot and some low salt recipes.  Educated pt on low salt diet and importance of it.  Will visit pt in 2 weeks, meds verified, husband give meds to pt, he is aware of what she takes. Pt states she is able to afford her meds and is not out of any.    [place] [phone]

## 2017-09-11 ENCOUNTER — Encounter (INDEPENDENT_AMBULATORY_CARE_PROVIDER_SITE_OTHER): Payer: Self-pay

## 2017-09-15 ENCOUNTER — Other Ambulatory Visit (INDEPENDENT_AMBULATORY_CARE_PROVIDER_SITE_OTHER): Payer: Self-pay | Admitting: Vascular Surgery

## 2017-09-17 ENCOUNTER — Ambulatory Visit
Admission: RE | Admit: 2017-09-17 | Discharge: 2017-09-17 | Disposition: A | Discharge status: Home / Self Care | Payer: Medicaid Other | Source: Ambulatory Visit | Attending: Vascular Surgery | Admitting: Vascular Surgery

## 2017-09-17 ENCOUNTER — Encounter
Admission: RE | Disposition: A | Discharge status: Home / Self Care | Payer: Self-pay | Source: Ambulatory Visit | Attending: Vascular Surgery

## 2017-09-17 DIAGNOSIS — N186 End stage renal disease: Secondary | ICD-10-CM | POA: Insufficient documentation

## 2017-09-17 DIAGNOSIS — I2510 Atherosclerotic heart disease of native coronary artery without angina pectoris: Secondary | ICD-10-CM | POA: Diagnosis not present

## 2017-09-17 DIAGNOSIS — E1122 Type 2 diabetes mellitus with diabetic chronic kidney disease: Secondary | ICD-10-CM | POA: Insufficient documentation

## 2017-09-17 DIAGNOSIS — I132 Hypertensive heart and chronic kidney disease with heart failure and with stage 5 chronic kidney disease, or end stage renal disease: Secondary | ICD-10-CM | POA: Insufficient documentation

## 2017-09-17 DIAGNOSIS — J449 Chronic obstructive pulmonary disease, unspecified: Secondary | ICD-10-CM | POA: Diagnosis not present

## 2017-09-17 DIAGNOSIS — E119 Type 2 diabetes mellitus without complications: Secondary | ICD-10-CM

## 2017-09-17 DIAGNOSIS — Z4901 Encounter for fitting and adjustment of extracorporeal dialysis catheter: Secondary | ICD-10-CM | POA: Insufficient documentation

## 2017-09-17 DIAGNOSIS — I5032 Chronic diastolic (congestive) heart failure: Secondary | ICD-10-CM | POA: Diagnosis not present

## 2017-09-17 DIAGNOSIS — Z8249 Family history of ischemic heart disease and other diseases of the circulatory system: Secondary | ICD-10-CM | POA: Diagnosis not present

## 2017-09-17 DIAGNOSIS — I10 Essential (primary) hypertension: Secondary | ICD-10-CM

## 2017-09-17 HISTORY — PX: DIALYSIS/PERMA CATHETER REMOVAL: CATH118289

## 2017-09-17 SURGERY — DIALYSIS/PERMA CATHETER REMOVAL
Anesthesia: LOCAL

## 2017-09-17 MED ORDER — LIDOCAINE-EPINEPHRINE (PF) 1 %-1:200000 IJ SOLN
INTRAMUSCULAR | Status: DC | PRN
  Administered 2017-09-17: 20 mL via INTRADERMAL

## 2017-09-17 MED ORDER — MIDAZOLAM HCL 2 MG/ML PO SYRP
ORAL_SOLUTION | ORAL | Status: AC
  Filled 2017-09-17: qty 4

## 2017-09-17 MED ORDER — LIDOCAINE-EPINEPHRINE (PF) 1 %-1:200000 IJ SOLN
INTRAMUSCULAR | Status: AC
  Filled 2017-09-17: qty 30

## 2017-09-17 MED ORDER — MIDAZOLAM HCL 2 MG/ML PO SYRP
8.0000 mg | ORAL_SOLUTION | Freq: Once | ORAL | Status: AC
  Administered 2017-09-17: 8 mg via ORAL

## 2017-09-17 SURGICAL SUPPLY — 4 items
FCP FG STRG 5.5XNS LF DISP (INSTRUMENTS) ×1
FORCEPS FG STRG 5.5XNS LF DISP (INSTRUMENTS) IMPLANT
FORCEPS KELLY 5.5 STR (INSTRUMENTS) ×2
TRAY LACERAT/PLASTIC (MISCELLANEOUS) ×1 IMPLANT

## 2017-09-17 NOTE — H&P (Signed)
[facility] Admission History & Physical  MRN : [id number]  [name] is a 47 y.o. ([birth year]) female who presents with chief complaint of No chief complaint on file. [name]  History of Present Illness: I am asked to evaluate the patient by the dialysis center. The patient was sent here because they have a nonfunctioning tunneled catheter and a functioning arm access in her left arm.  The patient reports they're not been any problems with any of their dialysis runs. They are reporting good flows with good parameters at dialysis.  Patient denies pain or tenderness overlying the access.  There is no pain with dialysis.  The patient denies hand pain or finger pain consistent with steal syndrome.  No fevers or chills while on dialysis.   Current Facility-Administered Medications  Medication Dose Route Frequency Provider Last Rate Last Dose  . midazolam (VERSED) 2 MG/ML syrup 8 mg  8 mg Oral Once Algernon Huxley, MD        Past Medical History:  Diagnosis Date  . (HFpEF) heart failure with preserved ejection fraction ([place])    a. 11/2014 Echo: EF 50-55%, Gr1 DD; b. 06/2016 Echo: EF 60-65%, no rwma, mod dil LA, nl RV, triv eff.  . Asthma   . CHF (congestive heart failure) ([place])   . CKD (chronic kidney disease), stage III ([place])   . COPD (chronic obstructive pulmonary disease) ([place])    a. Home O2.  . Diabetes mellitus without complication ([place])   . Hypertension   . Iron deficiency anemia    a. chronic blood loss->heavy menses.  . Morbid obesity ([place])   . Pericardial effusion    a. 11/2014 CT Chest: Mod effusion; b. 11/2014 Echo: small to mod circumferential effusion. No hemodynamic compromise.  [name] Poorly controlled diabetes mellitus ([place])    a. 11/2014 A1c 13.2.    Past Surgical History:  Procedure Laterality Date  . A/V FISTULAGRAM Left 06/09/2017   Procedure: A/V FISTULAGRAM;  Surgeon: Algernon Huxley, MD;  Location: [facility];  Service: Cardiovascular;   Laterality: Left;  . A/V FISTULAGRAM Left 06/16/2017   Procedure: A/V FISTULAGRAM;  Surgeon: Algernon Huxley, MD;  Location: [facility];  Service: Cardiovascular;  Laterality: Left;  . AV FISTULA PLACEMENT Left 04/25/2017   Procedure: ARTERIOVENOUS (AV) FISTULA CREATION;  Surgeon: Algernon Huxley, MD;  Location: [facility];  Service: Vascular;  Laterality: Left;  . CESAREAN SECTION     times 3  . DIALYSIS/PERMA CATHETER INSERTION Right 04/04/2017   Procedure: DIALYSIS/PERMA CATHETER INSERTION;  Surgeon: Katha Cabal, MD;  Location: [facility];  Service: Cardiovascular;  Laterality: Right;  . INSERTION OF DIALYSIS CATHETER N/A 04/16/2017   Procedure: INSERTION OF DIALYSIS PERM CATHETER;  Surgeon: Algernon Huxley, MD;  Location: [facility];  Service: Vascular;  Laterality: N/A;    Social History Social History   Tobacco Use  . Smoking status: Never Smoker  . Smokeless tobacco: Never Used  Substance Use Topics  . Alcohol use: Yes    Alcohol/week: 0.0 oz    Comment: occasional  . Drug use: Yes    Types: Marijuana    Family History Family History  Problem Relation Age of Onset  . Diabetes Mother   . Hypertension Mother   . Hypertension Father   . Diabetes Father   . Hypertension Unknown   . Diabetes Unknown   . Breast cancer Maternal Aunt 14  . Breast cancer Maternal Aunt  50    No family history of bleeding or clotting disorders, autoimmune disease or porphyria  Allergies  Allergen Reactions  . Dust Mite Extract Shortness Of Breath and Itching  . Mold Extract [Trichophyton] Shortness Of Breath and Itching  . Feraheme [Ferumoxytol] Itching  . Pollen Extract Other (See Comments)    Unknown  . Sulfa Antibiotics Itching     REVIEW OF SYSTEMS (Negative unless checked)  Constitutional: [] Weight loss  [] Fever  [] Chills Cardiac: [] Chest pain   [] Chest pressure   [] Palpitations   [] Shortness of breath when laying flat   [] Shortness of breath at rest   [x] Shortness of  breath with exertion. Vascular:  [] Pain in legs with walking   [] Pain in legs at rest   [] Pain in legs when laying flat   [] Claudication   [] Pain in feet when walking  [] Pain in feet at rest  [] Pain in feet when laying flat   [] History of DVT   [] Phlebitis   [x] Swelling in legs   [] Varicose veins   [] Non-healing ulcers Pulmonary:   [] Uses home oxygen   [] Productive cough   [] Hemoptysis   [] Wheeze  [x] COPD   [] Asthma Neurologic:  [] Dizziness  [] Blackouts   [] Seizures   [] History of stroke   [] History of TIA  [] Aphasia   [] Temporary blindness   [] Dysphagia   [] Weakness or numbness in arms   [] Weakness or numbness in legs Musculoskeletal:  [x] Arthritis   [] Joint swelling   [] Joint pain   [] Low back pain Hematologic:  [] Easy bruising  [] Easy bleeding   [] Hypercoagulable state   [x] Anemic  [] Hepatitis Gastrointestinal:  [] Blood in stool   [] Vomiting blood  [x] Gastroesophageal reflux/heartburn   [] Difficulty swallowing. Genitourinary:  [x] Chronic kidney disease   [] Difficult urination  [] Frequent urination  [] Burning with urination   [] Blood in urine Skin:  [] Rashes   [] Ulcers   [] Wounds Psychological:  [x] History of anxiety   []  History of major depression.  Physical Examination  There were no vitals filed for this visit. There is no height or weight on file to calculate BMI. Gen: WD/WN, NAD. Obese  Head: [place]/AT, No temporalis wasting.  Ear/Nose/Throat: Hearing grossly intact, nares w/o erythema or drainage, oropharynx w/o Erythema/Exudate,  Eyes: Conjunctiva clear, sclera non-icteric Neck: Trachea midline.  No JVD.  Pulmonary:  Good air movement, respirations not labored, no use of accessory muscles.  Cardiac: RRR, normal S1, S2. Vascular: good thrill in left arm access Vessel Right Left  Radial Palpable Palpable               Musculoskeletal: M/S 5/5 throughout.  Extremities without ischemic changes.  No deformity or atrophy.  Neurologic: Sensation grossly intact in extremities.   Symmetrical.  Speech is fluent. Motor exam as listed above. Psychiatric: Judgment intact, Mood & affect appropriate for pt's clinical situation. Dermatologic: No rashes or ulcers noted.  No cellulitis or open wounds.    CBC Lab Results  Component Value Date   WBC 12.4 (H) 04/24/2017   HGB 8.0 (L) 04/24/2017   HCT 26.5 (L) 04/24/2017   MCV 86.3 04/24/2017   PLT 338 04/24/2017    BMET    Component Value Date/Time   NA 131 (L) 04/26/2017 0609   NA 135 (L) 11/21/2013 1153   K 5.0 04/26/2017 0609   K 4.1 11/21/2013 1153   CL 95 (L) 04/26/2017 0609   CL 98 11/21/2013 1153   CO2 26 04/26/2017 0609   CO2 30 11/21/2013 1153   GLUCOSE 266 (H) 04/26/2017 1610  GLUCOSE 287 (H) 11/21/2013 1153   BUN 46 (H) 04/26/2017 0609   BUN 16 11/21/2013 1153   CREATININE 3.97 (H) 04/26/2017 0609   CREATININE 0.85 11/21/2013 1153   CALCIUM 8.7 (L) 04/26/2017 0609   CALCIUM 8.7 11/21/2013 1153   GFRNONAA 13 (L) 04/26/2017 0609   GFRNONAA >60 11/21/2013 1153   GFRAA 15 (L) 04/26/2017 0609   GFRAA >60 11/21/2013 1153   CrCl cannot be calculated ([name] most recent lab result is older than the maximum 21 days allowed.).  COAG No results found for: INR, PROTIME  Radiology No results found.  Assessment/Plan 1.  Complication dialysis device with catheter not being used:  [name] Tunneled catheter is not being used and presents an infectious and thrombotic risk. The patient has an extremity access that is functioning well. Therefore, the patient will undergo removal of the tunneled catheter under local anesthesia.  The risks and benefits were described to the patient.  All questions were answered.  The patient agrees to proceed with angiography and intervention. Potassium will be drawn to ensure that it is an appropriate level prior to performing intervention. 2.  End-stage renal disease requiring hemodialysis:  Patient will continue dialysis therapy without further interruption 3.   Hypertension:  Patient will continue medical management; nephrology is following no changes in oral medications. 4. Diabetes mellitus:  Glucose will be monitored and oral medications been held this morning once the patient has undergone the patient's procedure po intake will be reinitiated and again Accu-Cheks will be used to assess the blood glucose level and treat as needed. The patient will be restarted on the patient's usual hypoglycemic regime 5.  Coronary artery disease:  EKG will be monitored. Nitrates will be used if needed. The patient's oral cardiac medications will be continued.    [name], MD  09/17/2017 11:32 AM

## 2017-09-17 NOTE — Discharge Instructions (Signed)
Incision and Drainage, Care After Refer to this sheet in the next few weeks. These instructions provide you with information about caring for yourself after your procedure. Your health care provider may also give you more specific instructions. Your treatment has been planned according to current medical practices, but problems sometimes occur. Call your health care provider if you have any problems or questions after your procedure. What can I expect after the procedure? After the procedure, it is common to have:  Pain or discomfort around your incision site.  Drainage from your incision.  Follow these instructions at home:  Take over-the-counter and prescription medicines only as told by your health care provider.  If you were prescribed an antibiotic medicine, take it as told by your health care provider.Do not stop taking the antibiotic even if you start to feel better.  Followinstructions from your health care provider about: ? How to take care of your incision. ? When and how you should change your packing and bandage (dressing). Wash your hands with soap and water before you change your dressing. If soap and water are not available, use hand sanitizer. ? When you should remove your dressing.  Do not take baths, swim, or use a hot tub until your health care provider approves.  Keep all follow-up visits as told by your health care provider. This is important.  Check your incision area every day for signs of infection. Check for: ? More redness, swelling, or pain. ? More fluid or blood. ? Warmth. ? Pus or a bad smell. Contact a health care provider if:  Your cyst or abscess returns.  You have a fever.  You have more redness, swelling, or pain around your incision.  You have more fluid or blood coming from your incision.  Your incision feels warm to the touch.  You have pus or a bad smell coming from your incision. Get help right away if:  You have severe pain or  bleeding.  You cannot eat or drink without vomiting.  You have decreased urine output.  You become short of breath.  You have chest pain.  You cough up blood.  The area where the incision and drainage occurred becomes numb or it tingles. This information is not intended to replace advice given to you by your health care provider. Make sure you discuss any questions you have with your health care provider. Document Released: 05/06/2011 Document Revised: 07/14/2015 Document Reviewed: 12/02/2014 Elsevier Interactive Patient Education  2018 [employer] Inc[employer]  

## 2017-09-17 NOTE — Op Note (Signed)
Operative Note     Preoperative diagnosis:   1. ESRD with functional permanent access  Postoperative diagnosis:  1. ESRD with functional permanent access  Procedure:  Removal of left jugular Permcath  Surgeon:  [name], MD  Anesthesia:  Local  EBL:  Minimal  Indication for the Procedure:  The patient has a functional permanent dialysis access and no longer needs their permcath.  This can be removed.  Risks and benefits are discussed and informed consent is obtained.  Description of the Procedure:  The patient's left neck, chest and existing catheter were sterilely prepped and draped. The area around the catheter was anesthetized copiously with 1% lidocaine. The catheter was dissected out with curved hemostats until the cuff was freed from the surrounding fibrous sheath. The fiber sheath was transected, and the catheter was then removed in its entirety using gentle traction. Pressure was held and sterile dressings were placed. The patient tolerated the procedure well and was taken to the recovery room in stable condition.     [name]  09/17/2017, 1:29 PM This note was created with Dragon Medical transcription system. Any errors in dictation are purely unintentional.

## 2017-09-18 ENCOUNTER — Encounter: Payer: Self-pay | Admitting: Vascular Surgery

## 2017-09-24 ENCOUNTER — Other Ambulatory Visit (HOSPITAL_COMMUNITY): Payer: Self-pay

## 2017-09-24 ENCOUNTER — Encounter (HOSPITAL_COMMUNITY): Payer: Self-pay

## 2017-09-24 NOTE — Progress Notes (Signed)
Bp: 140/76, pulse: 80, resp: 18, spo2: 94, todays weight: 259 lbs, last weight: 247, bgl: 158  Pt states feels good, been doing good.  Pt has a 12 lbs weight gain, legs have some swelling in ankle area, lower legs look good.  Lungs clear, abdomen does not feel tight.  Pt states she has got her appetite back and she states has a sweet tooth in past month.  She states her husband has made 2 cakes and been drinking koolaid.  Talked about diet and replacements she could do that could stop the craving for sugars.  She states her A1C is up also.  Talked about how that affects her kidneys and her heart.  Went over some recipes that are better for her, she states she will try.  Pt states she is walking more and more and getting out to stores now.  Pt denies headaches, dizziness or chest pain.  Meds verfied, husband gives her medicines to her and he is aware of what she takes.   Will continue to visit [place].  [place] [phone]

## 2017-10-08 ENCOUNTER — Other Ambulatory Visit (HOSPITAL_COMMUNITY): Payer: Self-pay

## 2017-10-08 ENCOUNTER — Encounter (HOSPITAL_COMMUNITY): Payer: Self-pay

## 2017-10-08 NOTE — Progress Notes (Signed)
Bp: 148/70, pulse: 74, resp: 18, spo2: 91, last weight: 259, todays: 258 lbs, bgl: 135  Pt states she has been doing well.  She has been going out more but eating more fast food.  She has been watching some salt but not much and been eating sweets.  Lower extremities no swelling.  Lungs wheezes lower, no fluid heard.  Pt states time for neb treatment.  She has some congestion in her nasal, she states will start using some nasal spray. Discussed diet and some recipes.  Talked about making food taste better and easy things to snack on.  Discussed fluids.  She states been to all dialysis appts.  She states walking without assistance more.  They have their car fixed so easier to get around. Pt denies headaches or dizziness.  Will continue to educate patient on healthy foods during our visits. Meds verified and husband gives pt her meds and he is aware of her meds.   [place] [phone]

## 2017-10-27 IMAGING — CR DG ABDOMEN 2V
5 series · 6 of 6 positions shown · non-contrast
Comparison: PA and lateral chest this same day.

CLINICAL DATA: Shortness of breath and weight gain.

EXAM:
ABDOMEN - 2 VIEW

[Series 1: abdomen erect · 0.14mm/px · 2 of 2 slices shown]
[im 1/2]
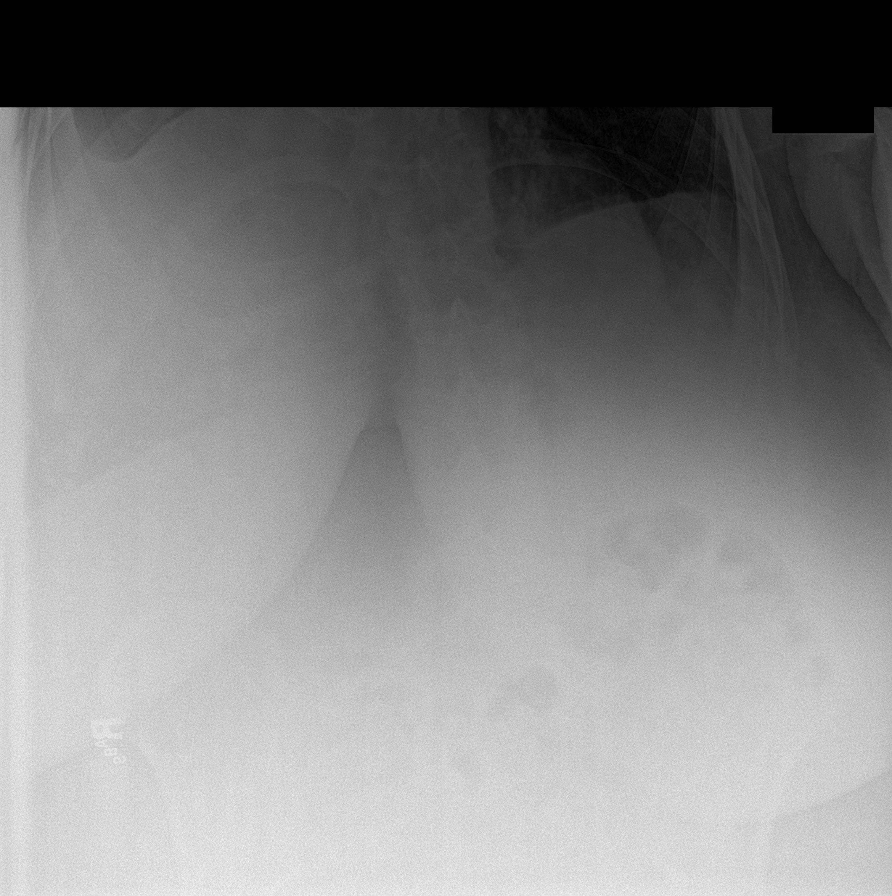
[im 2/2]
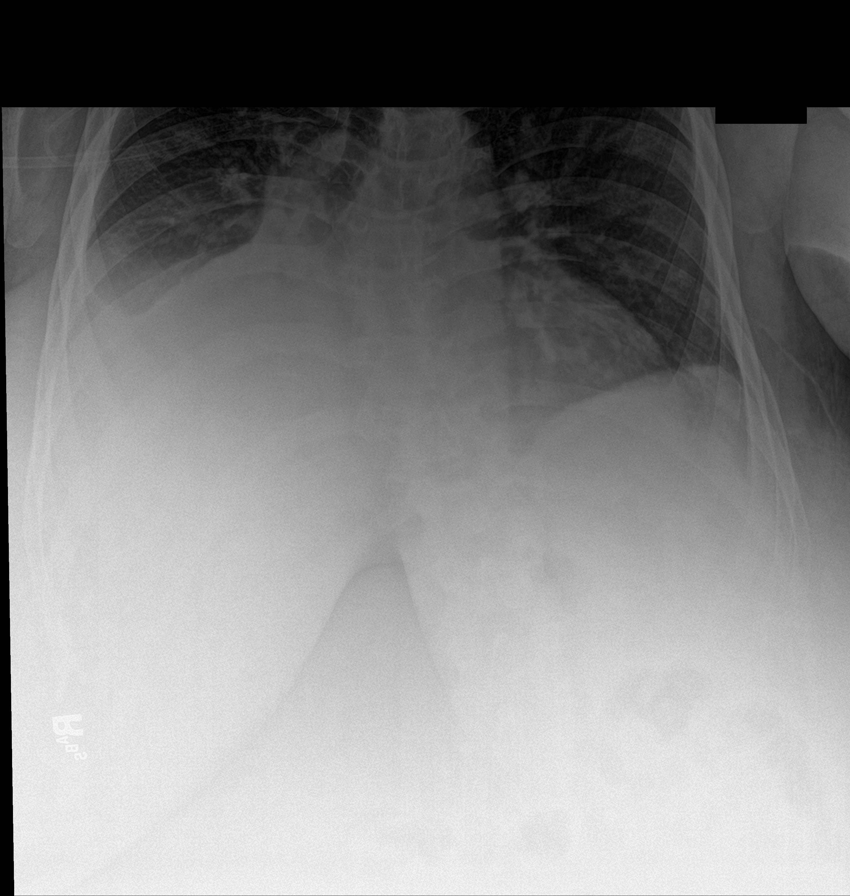

[abdomen supine (1 of 4)]
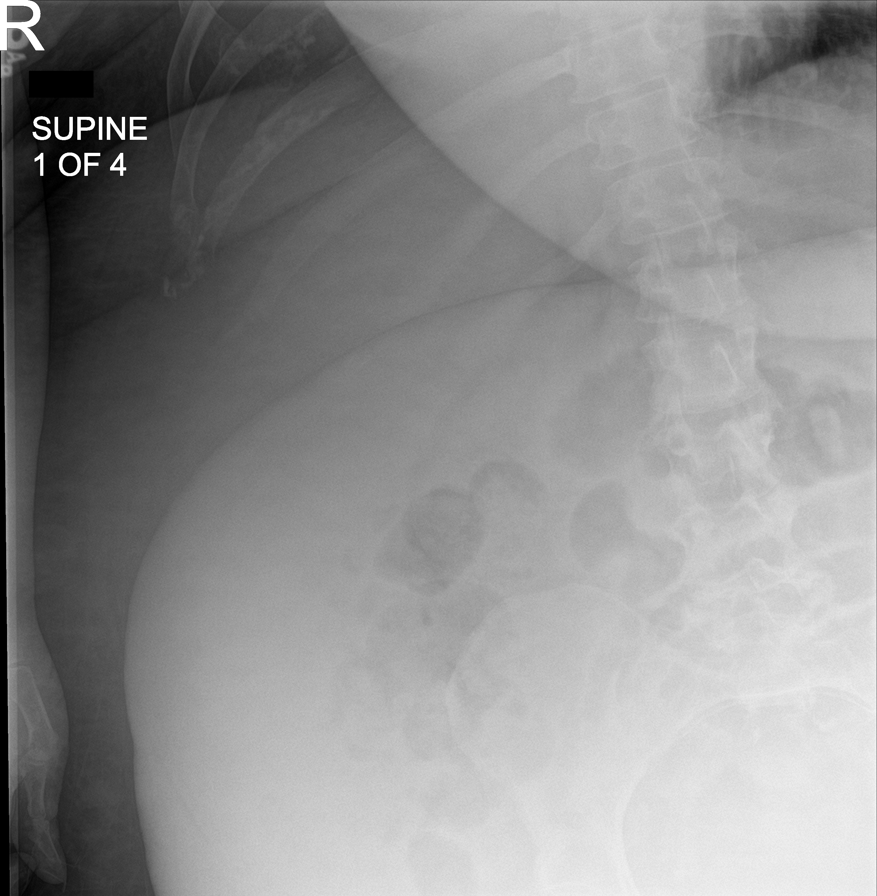

[abdomen supine (2 of 4)]
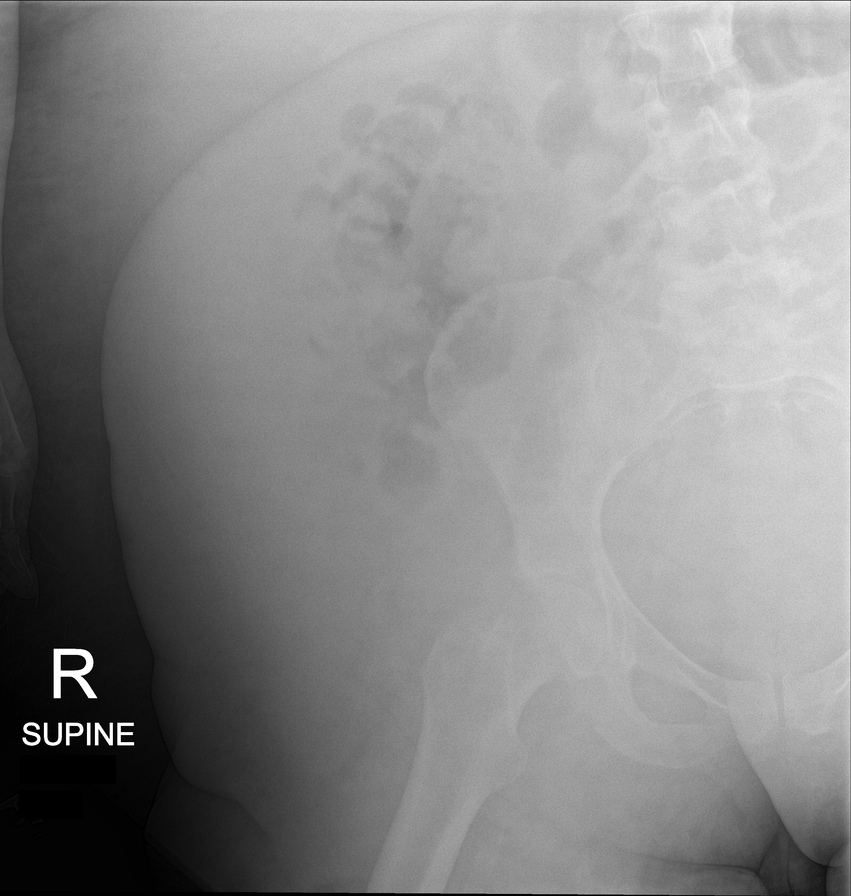

[abdomen supine (3 of 4)]
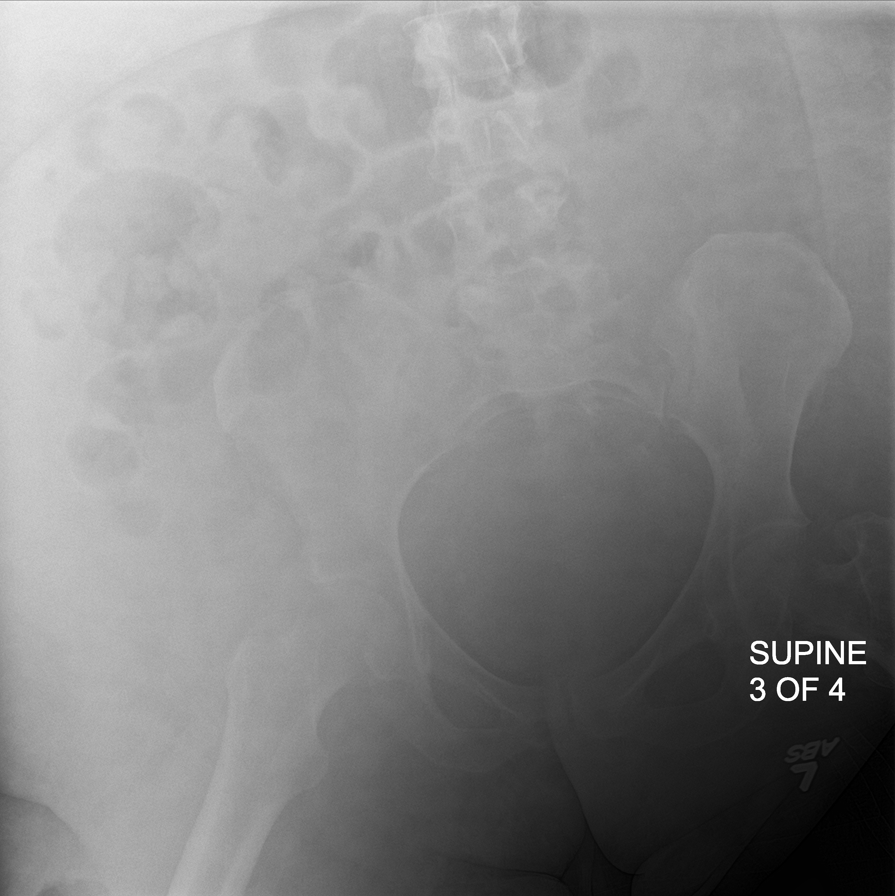

[abdomen supine (4 of 4)]
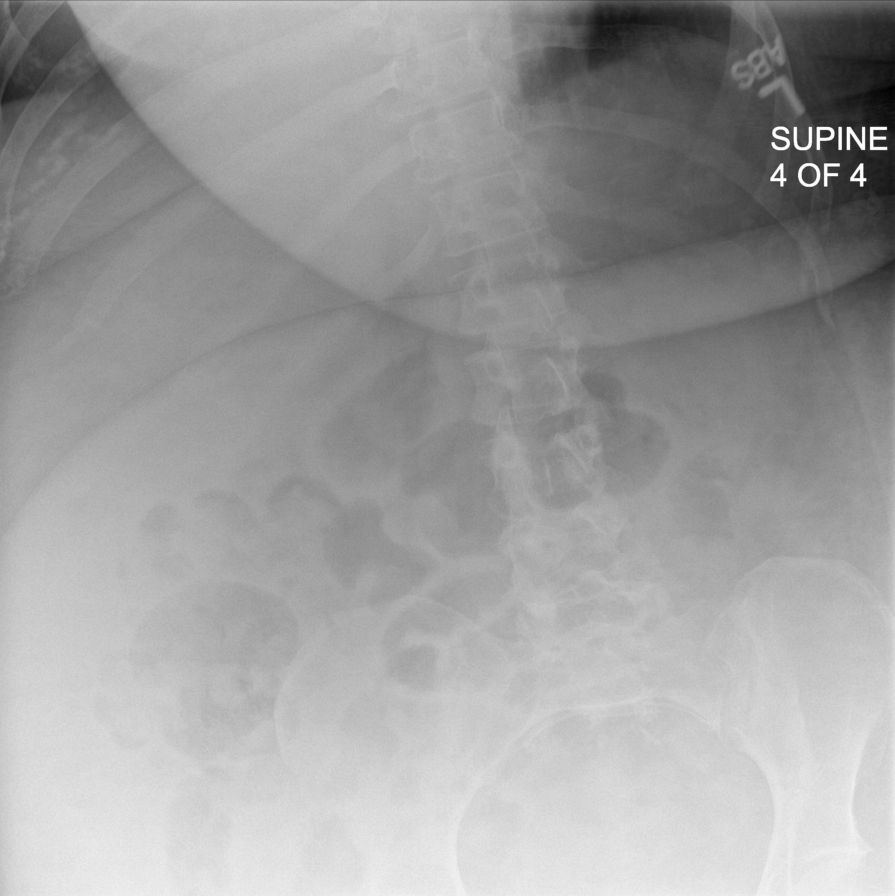

[6 of 6 positions shown; findings below may reference images not displayed]

FINDINGS: Right pleural effusion and basilar airspace disease are seen. No
free intraperitoneal air is identified. The bowel gas pattern is
nonobstructive. No abnormal abdominal calcification or focal bony
abnormality.
IMPRESSION: Negative abdomen.

Right pleural effusion and basilar airspace disease.

## 2017-11-12 ENCOUNTER — Other Ambulatory Visit (HOSPITAL_COMMUNITY): Payer: Self-pay

## 2017-11-12 ENCOUNTER — Encounter (HOSPITAL_COMMUNITY): Payer: Self-pay

## 2017-11-12 NOTE — Progress Notes (Signed)
Bp: 142/68, pulse: 61, resp: 18, spo2: 96 todays weight 269 lbs last weight: 258 lbs bgl: 81  Visit with [name] today she states been feeling down because her weight is going up and she knows its what she has been eating.  She states her A1C is up.  She has gained 10 lbs in past month.  She has no swelling, lungs are clear.  She denies shortness of breath.  She has no dry cough or wet cough. Discussed diet with her, she is going to start journaling what she eat everyday and we will look through it with every visit.  She is going to start writing her daily weights down.  Discussed recipes she can try.  She states been going without her oxygen some, trying to wean herself off of it.  She states has bumped her oxygen down. She is trying to become more active, she is walking without walker now.  She states she is going to start exercising at [employer], advised her to start off slow and increase slowly. She states has not missed any dialysis appts and her nephrologist is very impressed with her numbers. She states she is almost where they would consider her for a kidney transplant.  She states watches her fluid intake.  Meds verified and her husband gives her meds to her.  He is aware of what she takes.  Will continue to visit and educate on diet.   [place] [phone]

## 2017-11-25 IMAGING — US US EXTREM LOW VENOUS BILAT
1 series · 14 of 24 positions shown · non-contrast
Comparison: [DATE]

CLINICAL DATA: Swelling x1 month

EXAM:
BILATERAL LOWER EXTREMITY VENOUS DOPPLER ULTRASOUND
TECHNIQUE: Gray-scale sonography with compression, as well as color and duplex
ultrasound, were performed to evaluate the deep venous system from
the level of the common femoral vein through the popliteal and
proximal calf veins. Technologist describes technically difficult
examination secondary to diffuse anasarca involving abdomen and
lower extremities.

[Series 1: us extrem low venous bilat · 0.12mm/px · 14 of 52 slices shown]
[im 1/52]
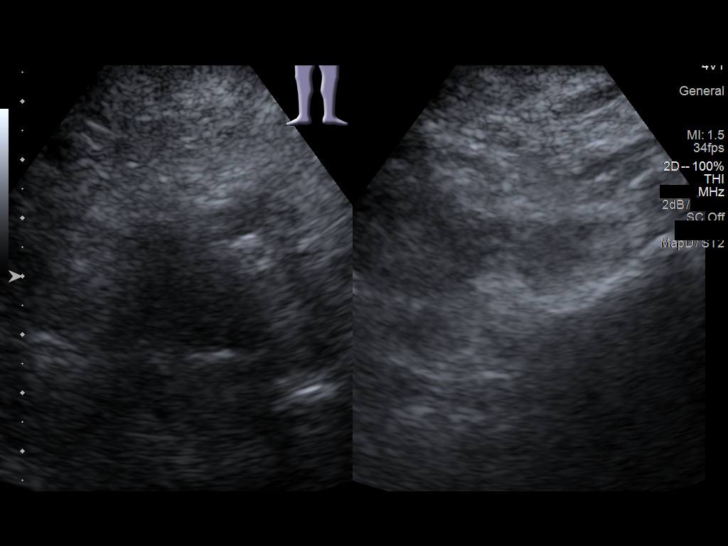
[im 5/52]
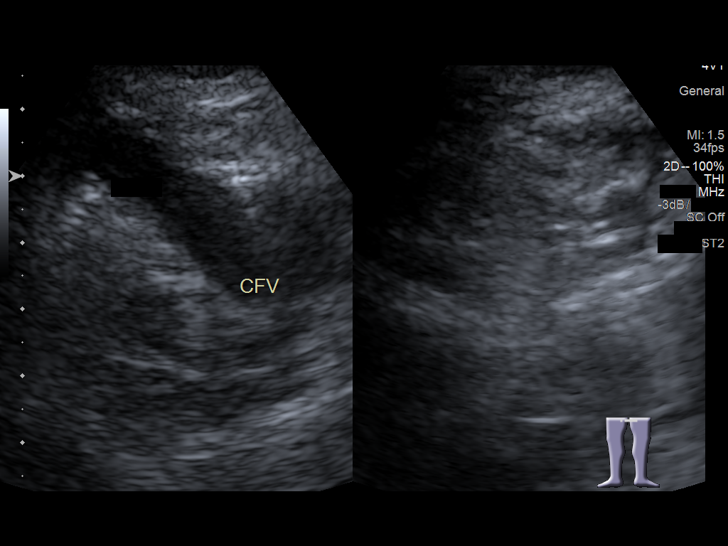
[im 9/52]
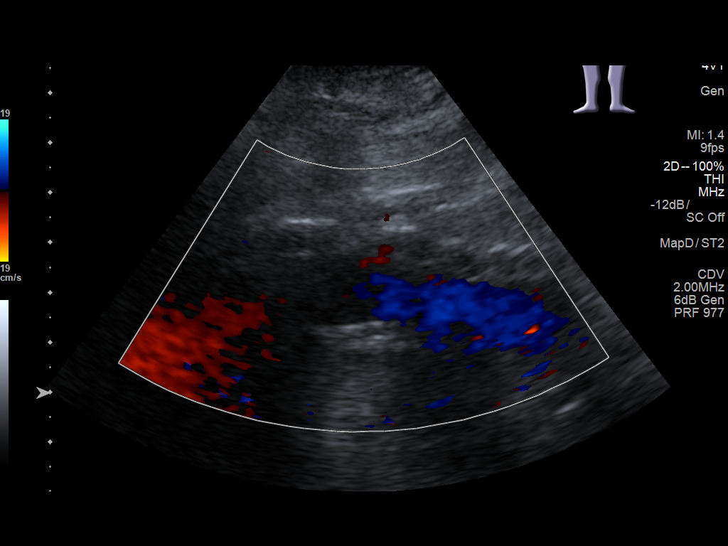
[im 14/52]
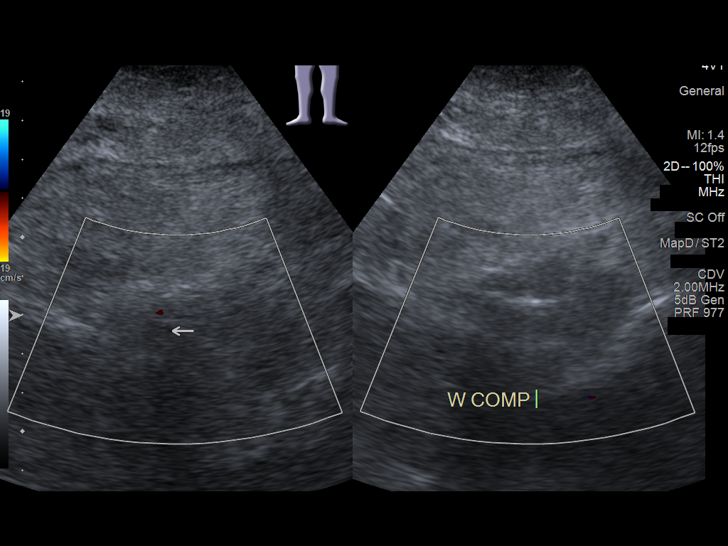
[im 16/52]
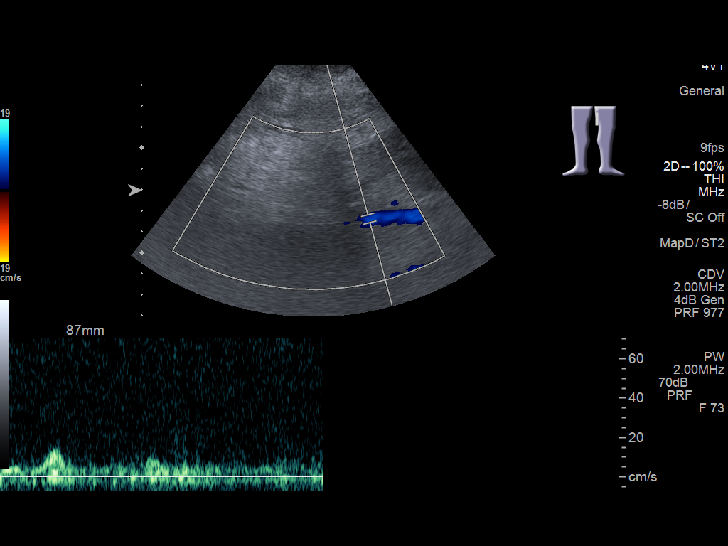
[im 20/52]
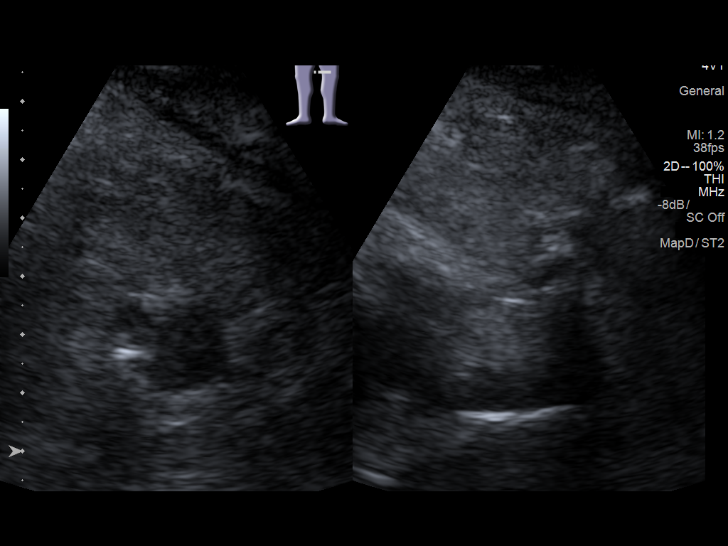
[im 25/52]
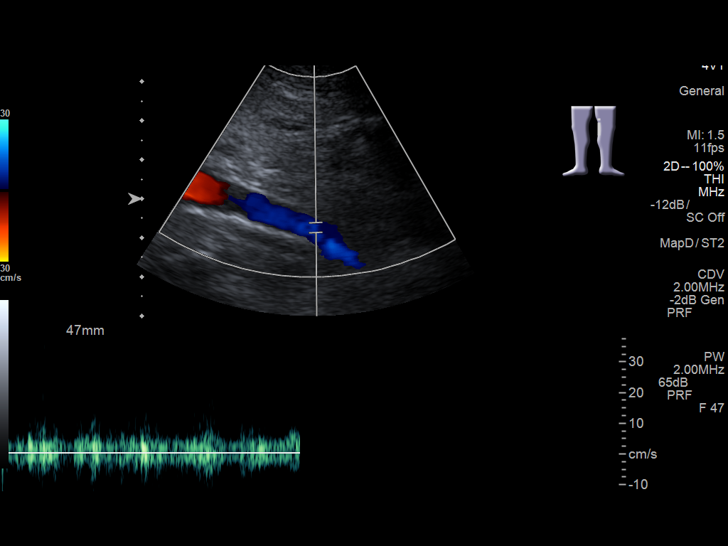
[im 27/52]
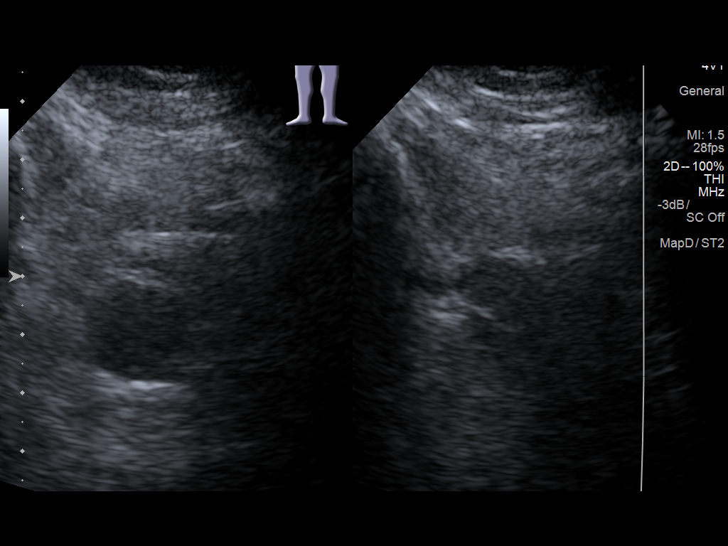
[im 32/52]
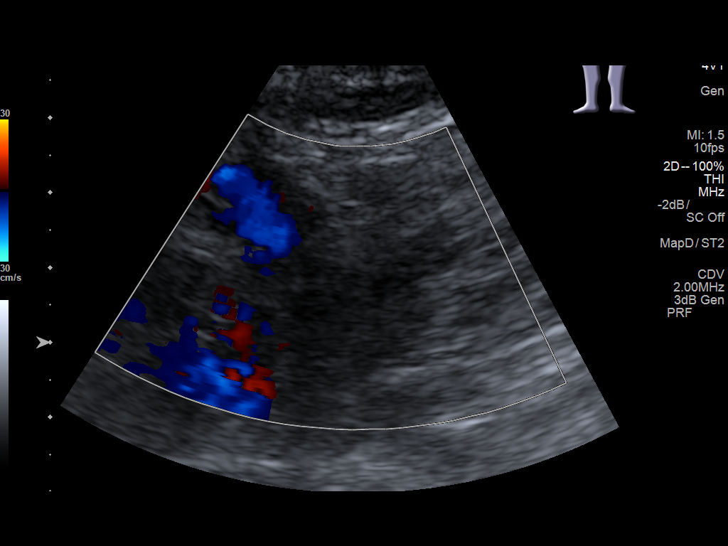
[im 36/52]
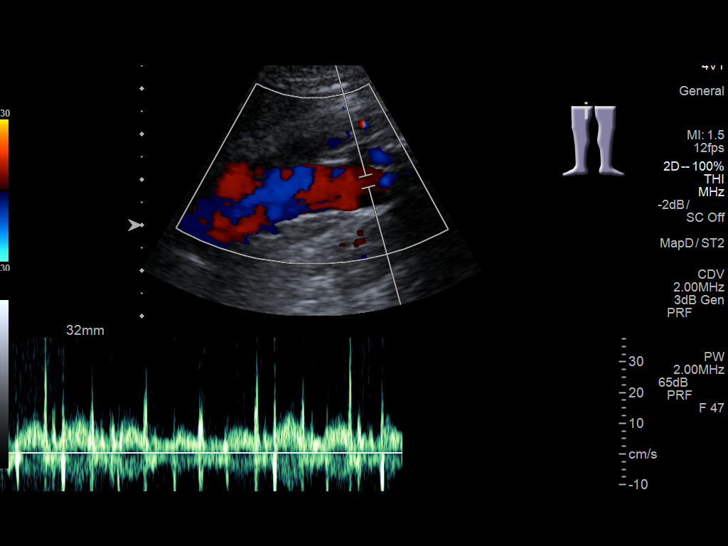
[im 40/52]
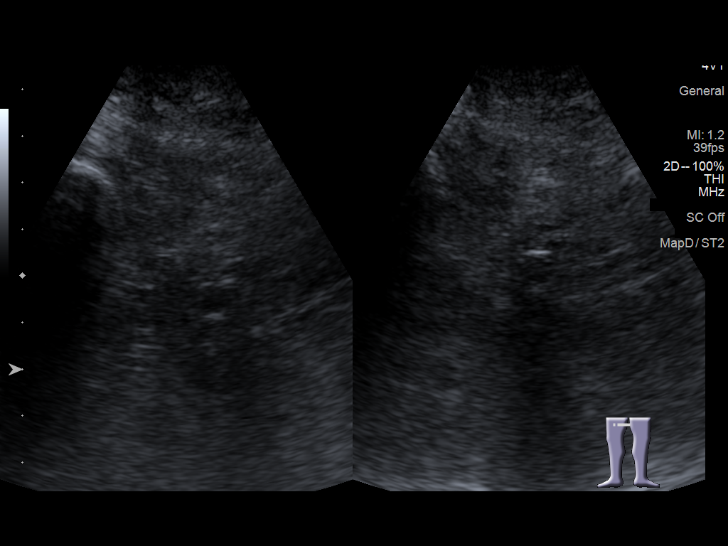
[im 43/52]
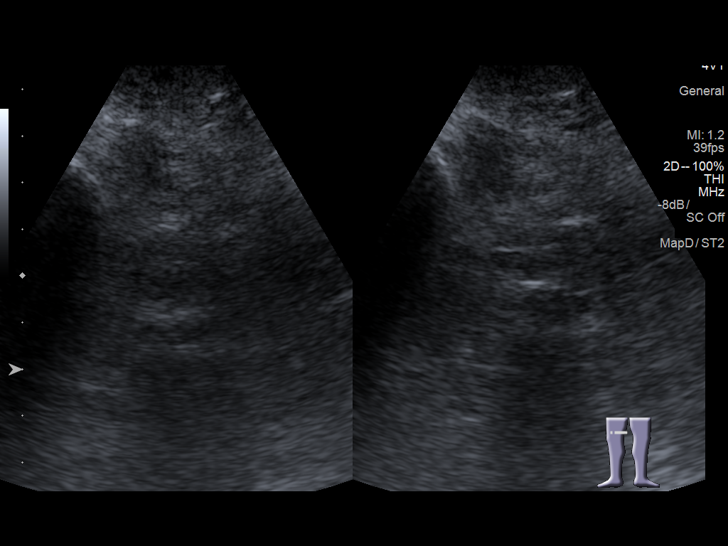
[im 47/52]
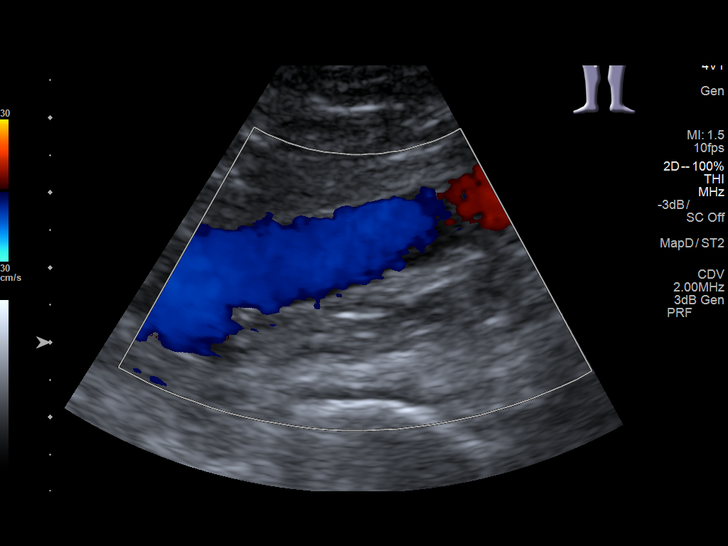
[im 52/52]
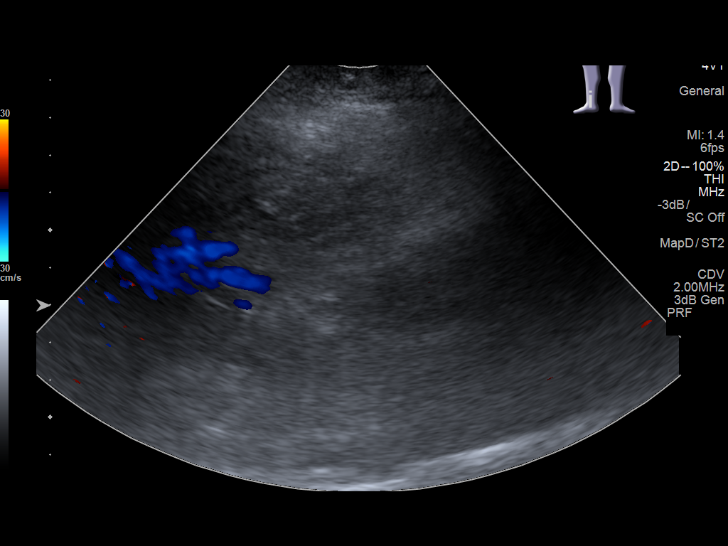

[14 of 24 positions shown; findings below may reference images not displayed]

FINDINGS: Normal compressibility of the common femoral, superficial femoral,
and popliteal veins. Limited evaluation of the proximal calf veins
secondary to anasarca.. No filling defects to suggest DVT on
grayscale or color Doppler imaging. Doppler waveforms show normal
direction of venous flow, normal respiratory phasicity. Limited
response to augmentation in bilateral segments of femoral and
popliteal veins may be related to marked anasarca..
IMPRESSION: No evidence of lower extremity deep vein thrombosis, with
limitations as above.

## 2017-11-26 ENCOUNTER — Other Ambulatory Visit (HOSPITAL_COMMUNITY): Payer: Self-pay

## 2017-11-26 ENCOUNTER — Encounter (HOSPITAL_COMMUNITY): Payer: Self-pay

## 2017-11-26 NOTE — Progress Notes (Signed)
Bp: 122/80, pulse: 76, resp: 18, spo2: 97, refused to weight  Todays visit she appears to be down.  She states she can not stop eating.  She states she had to have an extra dialysis treatment this week.  She states drinking too much fluids.  Asked how I could help, she was not sure.  Will continue to try to motivate her, we discussed foods and recipes.  She did try some peanut butter cookies this past wek made with splenda, she liked them.  She states she is waiting for her medicare and wants PT back.  She mentioned trying to go to the gym.  Her family does not help her with eating in front of her.  Will try to get them involved.  Lungs are clear, she denies headaches, or chest pain.  She was stressed last week and states she had some chest pain, but stress has lowered and she has not had any since.  She refused to weigh due to she knows her weight has been going up and not due to fluid.  Will continue to educate on diet and heart failure.  Will continue to visit.   [place] [phone]

## 2017-12-24 ENCOUNTER — Telehealth (HOSPITAL_COMMUNITY): Payer: Self-pay

## 2017-12-24 NOTE — Telephone Encounter (Signed)
Spoke with [name], she states been doing well.  Reminded her of appt with [name] next week.  She states she will be there.   [place] [phone]

## 2017-12-31 ENCOUNTER — Ambulatory Visit: Payer: Medicaid Other | Admitting: Family

## 2018-01-06 NOTE — Progress Notes (Signed)
Patient ID: [name], female    DOB: [birth year], 47 y.o.   MRN: [id number]  HPI  [name] is a 47 y/o female with a history of DM, iron deficiency anemia, HTN, COPD with chronic oxygen use, asthma, obesity and chronic heart failure.   Echo report from 11/12/16 showed an EF of 55% along with mild MR. Reviewed echo done 06/27/16 which showed an EF of 60-65%. Previous echo was done 12/15/14 and showed an EF of 50-55% without any valvular regurgitation.   Has not been admitted or been in the ED in the last 6 months.   She presents today for a follow-up visit with a chief complaint of moderate fatigue upon minimal exertion. She describes this as chronic in nature having been present for several years. She has associated cough, shortness of breath, light-headedness, weakness, hip pain and gradual weight gain along with this. She denies any abdominal distention or pedal edema.Says that her weight gain is because she has just been overeating although has still tried to monitor her sodium intake. Has been eating out and is aware that fast food will have a lot of sodium in it.   Past Medical History:  Diagnosis Date  . (HFpEF) heart failure with preserved ejection fraction ([place])    a. 11/2014 Echo: EF 50-55%, Gr1 DD; b. 06/2016 Echo: EF 60-65%, no rwma, mod dil LA, nl RV, triv eff.  . Asthma   . CHF (congestive heart failure) ([place])   . CKD (chronic kidney disease), stage III ([place])   . COPD (chronic obstructive pulmonary disease) ([place])    a. Home O2.  . Diabetes mellitus without complication ([place])   . Hypertension   . Iron deficiency anemia    a. chronic blood loss->heavy menses.  . Morbid obesity ([place])   . Pericardial effusion    a. 11/2014 CT Chest: Mod effusion; b. 11/2014 Echo: small to mod circumferential effusion. No hemodynamic compromise.  [name] Poorly controlled diabetes mellitus ([place])    a. 11/2014 A1c 13.2.   Past Surgical History:  Procedure Laterality Date  . A/V FISTULAGRAM Left  06/09/2017   Procedure: A/V FISTULAGRAM;  Surgeon: Algernon Huxley, MD;  Location: [facility];  Service: Cardiovascular;  Laterality: Left;  . A/V FISTULAGRAM Left 06/16/2017   Procedure: A/V FISTULAGRAM;  Surgeon: Algernon Huxley, MD;  Location: [facility];  Service: Cardiovascular;  Laterality: Left;  . AV FISTULA PLACEMENT Left 04/25/2017   Procedure: ARTERIOVENOUS (AV) FISTULA CREATION;  Surgeon: Algernon Huxley, MD;  Location: [facility];  Service: Vascular;  Laterality: Left;  . CESAREAN SECTION     times 3  . DIALYSIS/PERMA CATHETER INSERTION Right 04/04/2017   Procedure: DIALYSIS/PERMA CATHETER INSERTION;  Surgeon: Katha Cabal, MD;  Location: [facility];  Service: Cardiovascular;  Laterality: Right;  . DIALYSIS/PERMA CATHETER REMOVAL N/A 09/17/2017   Procedure: DIALYSIS/PERMA CATHETER REMOVAL;  Surgeon: Algernon Huxley, MD;  Location: [facility];  Service: Cardiovascular;  Laterality: N/A;  . INSERTION OF DIALYSIS CATHETER N/A 04/16/2017   Procedure: INSERTION OF DIALYSIS PERM CATHETER;  Surgeon: Algernon Huxley, MD;  Location: [facility];  Service: Vascular;  Laterality: N/A;   Family History  Problem Relation Age of Onset  . Diabetes Mother   . Hypertension Mother   . Hypertension Father   . Diabetes Father   . Hypertension Unknown   . Diabetes Unknown   . Breast cancer Maternal Aunt 78  . Breast cancer  Maternal Aunt 75   Social History   Tobacco Use  . Smoking status: Never Smoker  . Smokeless tobacco: Never Used  Substance Use Topics  . Alcohol use: Yes    Alcohol/week: 0.0 standard drinks    Comment: occasional   Allergies  Allergen Reactions  . Dust Mite Extract Shortness Of Breath and Itching  . Mold Extract [Trichophyton] Shortness Of Breath and Itching  . Feraheme [Ferumoxytol] Itching  . Pollen Extract Other (See Comments)    Unknown  . Sulfa Antibiotics Itching   Past Medical History:  Diagnosis Date  . (HFpEF) heart failure with  preserved ejection fraction ([place])    a. 11/2014 Echo: EF 50-55%, Gr1 DD; b. 06/2016 Echo: EF 60-65%, no rwma, mod dil LA, nl RV, triv eff.  . Asthma   . CHF (congestive heart failure) ([place])   . CKD (chronic kidney disease), stage III ([place])   . COPD (chronic obstructive pulmonary disease) ([place])    a. Home O2.  . Diabetes mellitus without complication ([place])   . Hypertension   . Iron deficiency anemia    a. chronic blood loss->heavy menses.  . Morbid obesity ([place])   . Pericardial effusion    a. 11/2014 CT Chest: Mod effusion; b. 11/2014 Echo: small to mod circumferential effusion. No hemodynamic compromise.  [name] Poorly controlled diabetes mellitus ([place])    a. 11/2014 A1c 13.2.   Past Surgical History:  Procedure Laterality Date  . A/V FISTULAGRAM Left 06/09/2017   Procedure: A/V FISTULAGRAM;  Surgeon: Algernon Huxley, MD;  Location: [facility];  Service: Cardiovascular;  Laterality: Left;  . A/V FISTULAGRAM Left 06/16/2017   Procedure: A/V FISTULAGRAM;  Surgeon: Algernon Huxley, MD;  Location: [facility];  Service: Cardiovascular;  Laterality: Left;  . AV FISTULA PLACEMENT Left 04/25/2017   Procedure: ARTERIOVENOUS (AV) FISTULA CREATION;  Surgeon: Algernon Huxley, MD;  Location: [facility];  Service: Vascular;  Laterality: Left;  . CESAREAN SECTION     times 3  . DIALYSIS/PERMA CATHETER INSERTION Right 04/04/2017   Procedure: DIALYSIS/PERMA CATHETER INSERTION;  Surgeon: Katha Cabal, MD;  Location: [facility];  Service: Cardiovascular;  Laterality: Right;  . DIALYSIS/PERMA CATHETER REMOVAL N/A 09/17/2017   Procedure: DIALYSIS/PERMA CATHETER REMOVAL;  Surgeon: Algernon Huxley, MD;  Location: [facility];  Service: Cardiovascular;  Laterality: N/A;  . INSERTION OF DIALYSIS CATHETER N/A 04/16/2017   Procedure: INSERTION OF DIALYSIS PERM CATHETER;  Surgeon: Algernon Huxley, MD;  Location: [facility];  Service: Vascular;  Laterality: N/A;   Family History  Problem Relation  Age of Onset  . Diabetes Mother   . Hypertension Mother   . Hypertension Father   . Diabetes Father   . Hypertension Unknown   . Diabetes Unknown   . Breast cancer Maternal Aunt 49  . Breast cancer Maternal Aunt 79   Social History   Tobacco Use  . Smoking status: Never Smoker  . Smokeless tobacco: Never Used  Substance Use Topics  . Alcohol use: Yes    Alcohol/week: 0.0 standard drinks    Comment: occasional   Allergies  Allergen Reactions  . Dust Mite Extract Shortness Of Breath and Itching  . Mold Extract [Trichophyton] Shortness Of Breath and Itching  . Feraheme [Ferumoxytol] Itching  . Pollen Extract Other (See Comments)    Unknown  . Sulfa Antibiotics Itching   Prior to Admission medications   Medication Sig Start Date End Date Taking? Authorizing Provider  acetaminophen (TYLENOL) 500 MG tablet Take 1,000 mg by mouth every 6 (six) hours as needed for mild pain, fever or headache.   Yes [provider]  albuterol (PROVENTIL HFA;VENTOLIN HFA) 108 (90 Base) MCG/ACT inhaler Inhale 2 puffs into the lungs every 6 (six) hours as needed for wheezing or shortness of breath. 03/10/17  Yes Salary, Holly Bodily D, MD  albuterol (PROVENTIL) (2.5 MG/3ML) 0.083% nebulizer solution Take 2.5 mg by nebulization every 6 (six) hours as needed for wheezing or shortness of breath.   Yes [provider]  aspirin EC 81 MG tablet Take 81 mg by mouth daily.    Yes [provider]  b complex-vitamin c-folic acid (NEPHRO-VITE) 0.8 MG TABS tablet Take 1 tablet by mouth at bedtime.   Yes [provider]  budesonide-formoterol (SYMBICORT) 160-4.5 MCG/ACT inhaler Inhale 2 puffs into the lungs 2 (two) times daily.   Yes [provider]  Cholecalciferol (HM VITAMIN D3) 4000 units CAPS Take 1 capsule by mouth once a week.   Yes [provider]  cloNIDine (CATAPRES) 0.2 MG tablet Take 1 tablet (0.2 mg total) by mouth 2 (two) times daily. 01/23/17  Yes Jacquelinne Speak,  Otila Kluver A, FNP  docusate sodium (COLACE) 100 MG capsule Take 100 mg by mouth daily.   Yes [provider]  ferric citrate (AURYXIA) 1 GM 210 MG(Fe) tablet Take 210 mg by mouth 3 (three) times daily with meals.   Yes [provider]  guaiFENesin-dextromethorphan (ROBITUSSIN DM) 100-10 MG/5ML syrup Take 5 mLs every 4 (four) hours as needed by mouth for cough. 01/10/17  Yes Demetrios Loll, MD  insulin aspart (NOVOLOG) 100 UNIT/ML injection Inject 0-5 Units into the skin at bedtime. 04/26/17  Yes Epifanio Lesches, MD  insulin aspart (NOVOLOG) 100 UNIT/ML injection Inject 0-15 Units into the skin 3 (three) times daily with meals. 04/26/17  Yes Epifanio Lesches, MD  insulin degludec (TRESIBA) 100 UNIT/ML SOPN FlexTouch Pen Inject 38 Units into the skin daily at 10 pm.   Yes [provider]  ipratropium-albuterol (DUONEB) 0.5-2.5 (3) MG/3ML SOLN Take 3 mLs by nebulization every 6 (six) hours as needed. 03/10/17  Yes Salary, Avel Peace, MD  lactulose (CHRONULAC) 10 GM/15ML solution Take 30 mLs (20 g total) by mouth daily as needed for severe constipation. 04/26/17  Yes Epifanio Lesches, MD  lidocaine-prilocaine (EMLA) cream Apply topically as needed (For access for dialysis).   Yes [provider]  losartan (COZAAR) 50 MG tablet Take 50 mg by mouth 2 (two) times daily. On dialysis days, patient is taking in the evening   Yes [provider]  metoprolol tartrate (LOPRESSOR) 100 MG tablet Take 1 tablet (100 mg total) by mouth 2 (two) times daily. 11/16/16  Yes Mody, Ulice Bold, MD  mometasone-formoterol (DULERA) 200-5 MCG/ACT AERO Inhale 2 puffs into the lungs 2 (two) times daily. Rinse mouth after use. 08/20/17  Yes Laverle Hobby, MD  OXYGEN Inhale 2-4 L into the lungs.   Yes [provider]  polyethylene glycol (MIRALAX / GLYCOLAX) packet Take 17 g by mouth daily as needed for mild constipation. 06/29/16  Yes Vaughan Basta, MD  torsemide (DEMADEX) 100  MG tablet Take 1 tablet (100 mg total) by mouth 2 (two) times daily. 03/10/17  Yes Salary, Avel Peace, MD    Review of Systems  Constitutional: Positive for fatigue. Negative for appetite change.  HENT: Positive for congestion. Negative for sinus pressure.   Eyes: Negative.   Respiratory: Positive for cough (loose cough) and shortness  of breath. Negative for chest tightness and wheezing.   Cardiovascular: Positive for chest pain (when coughing). Negative for leg swelling.  Gastrointestinal: Negative for abdominal distention and abdominal pain.  Endocrine: Negative.   Genitourinary: Negative.   Musculoskeletal: Positive for arthralgias (hips/knees). Negative for back pain.  Skin: Negative.   Allergic/Immunologic: Negative.   Neurological: Positive for weakness and light-headedness. Negative for dizziness.  Hematological: Negative for adenopathy. Does not bruise/bleed easily.  Psychiatric/Behavioral: Positive for dysphoric mood. Negative for sleep disturbance (wearing oxygen at 2L) and suicidal ideas. The patient is nervous/anxious.    Vitals:   01/07/18 1248  BP: 129/77  Pulse: 78  Resp: 18  SpO2: 95%  Weight: 282 lb (127.9 kg)  Height: 5\' 4"  (1.626 m)   Wt Readings from Last 3 Encounters:  01/07/18 282 lb (127.9 kg)  01/07/18 282 lb (127.9 kg)  11/12/17 269 lb (122 kg)   Lab Results  Component Value Date   CREATININE 3.97 (H) 04/26/2017   CREATININE 3.93 (H) 04/21/2017   CREATININE 4.30 (H) 04/18/2017    Physical Exam  Constitutional: She is oriented to person, place, and time. She appears well-developed and well-nourished.  HENT:  Head: Normocephalic and atraumatic.  Neck: Normal range of motion. Neck supple. No JVD present.  Cardiovascular: Normal rate and regular rhythm.  Pulmonary/Chest: Effort normal. She has no wheezes. She has no rhonchi. She has no rales.  Abdominal: She exhibits no distension. There is no tenderness.  Musculoskeletal: She exhibits no edema or  tenderness.  Neurological: She is alert and oriented to person, place, and time.  Skin: Skin is warm and dry.  Psychiatric: She has a normal mood and affect. Her behavior is normal. Thought content normal.  Nursing note and vitals reviewed.   Assessment & Plan:  1: Chronic heart failure with preserved ejection fraction- - NYHA class III - patient is weighing daily. Reminded to call for 2 lb weight gain overnight or 5 lb weight gain in a week.  - weight up 32 pounds since last here 4 months ago; says that she's been overeating and is trying to get back to her diet - does do some walking in her home; waiting on medicare to take affect so that she can resume PT - continues to try eat low sodium diet but admits that it hasn't been easy lately - says that she's drinking ~ 128-160 ounces of fluid daily; explained that she's drinking way too much and she says that she knows and is going to try and reduce the amount - last saw cardiologist Rockey Situ) 12/25/15.  - involved with paramedicine - BNP 03/27/17 was 577 - followed by Dr. Ashby Dawes last visit 08/20/17 - she says that she's received her flu vaccine for this season already  2. HTN-  - BP looks good today - BMP 04/26/17 shows sodium 131, potassium 5 and GFR 13/SCr 3.97  3: Diabetes-  - nonfasting glucose at home this morning was 189 - A1c 03/05/17 was 7.3%  4: ESRD- - dialysis Tuesday, Thursday, Saturday - [facility] to [facility] 01/30/18 to see what she needs to do to get on the kidney transplant list  Patient did not bring her medications nor a list. Each medication was verbally reviewed with the patient and she was encouraged to bring the bottles to every visit to confirm accuracy of list.  Return in 3 months or sooner for any questions/problems before then.

## 2018-01-07 ENCOUNTER — Other Ambulatory Visit (HOSPITAL_COMMUNITY): Payer: Self-pay

## 2018-01-07 ENCOUNTER — Encounter: Payer: Self-pay | Admitting: Family

## 2018-01-07 ENCOUNTER — Encounter (HOSPITAL_COMMUNITY): Payer: Self-pay

## 2018-01-07 ENCOUNTER — Ambulatory Visit: Payer: Medicaid Other | Attending: Family | Admitting: Family

## 2018-01-07 VITALS — BP 129/77 | HR 78 | Resp 18 | Ht 64.0 in | Wt 282.0 lb

## 2018-01-07 DIAGNOSIS — Z992 Dependence on renal dialysis: Secondary | ICD-10-CM | POA: Diagnosis not present

## 2018-01-07 DIAGNOSIS — Z6841 Body Mass Index (BMI) 40.0 and over, adult: Secondary | ICD-10-CM | POA: Diagnosis not present

## 2018-01-07 DIAGNOSIS — Z8249 Family history of ischemic heart disease and other diseases of the circulatory system: Secondary | ICD-10-CM | POA: Diagnosis not present

## 2018-01-07 DIAGNOSIS — R5383 Other fatigue: Secondary | ICD-10-CM | POA: Diagnosis present

## 2018-01-07 DIAGNOSIS — Z79899 Other long term (current) drug therapy: Secondary | ICD-10-CM | POA: Diagnosis not present

## 2018-01-07 DIAGNOSIS — N186 End stage renal disease: Secondary | ICD-10-CM | POA: Diagnosis not present

## 2018-01-07 DIAGNOSIS — D509 Iron deficiency anemia, unspecified: Secondary | ICD-10-CM | POA: Diagnosis not present

## 2018-01-07 DIAGNOSIS — J449 Chronic obstructive pulmonary disease, unspecified: Secondary | ICD-10-CM | POA: Diagnosis not present

## 2018-01-07 DIAGNOSIS — Z882 Allergy status to sulfonamides status: Secondary | ICD-10-CM | POA: Insufficient documentation

## 2018-01-07 DIAGNOSIS — Z9981 Dependence on supplemental oxygen: Secondary | ICD-10-CM | POA: Insufficient documentation

## 2018-01-07 DIAGNOSIS — Z833 Family history of diabetes mellitus: Secondary | ICD-10-CM | POA: Diagnosis not present

## 2018-01-07 DIAGNOSIS — Z794 Long term (current) use of insulin: Secondary | ICD-10-CM | POA: Diagnosis not present

## 2018-01-07 DIAGNOSIS — R0602 Shortness of breath: Secondary | ICD-10-CM | POA: Diagnosis present

## 2018-01-07 DIAGNOSIS — I132 Hypertensive heart and chronic kidney disease with heart failure and with stage 5 chronic kidney disease, or end stage renal disease: Secondary | ICD-10-CM | POA: Diagnosis not present

## 2018-01-07 DIAGNOSIS — E1122 Type 2 diabetes mellitus with diabetic chronic kidney disease: Secondary | ICD-10-CM | POA: Diagnosis not present

## 2018-01-07 DIAGNOSIS — I5032 Chronic diastolic (congestive) heart failure: Secondary | ICD-10-CM | POA: Insufficient documentation

## 2018-01-07 DIAGNOSIS — R05 Cough: Secondary | ICD-10-CM | POA: Diagnosis present

## 2018-01-07 DIAGNOSIS — I10 Essential (primary) hypertension: Secondary | ICD-10-CM

## 2018-01-07 NOTE — Patient Instructions (Signed)
Continue weighing daily and call for an overnight weight gain of > 2 pounds or a weekly weight gain of >5 pounds. 

## 2018-01-07 NOTE — Progress Notes (Deleted)
[facility] - PHARMACIST COUNSELING NOTE   ASSESSMENT   (Brief summary of the presentation/problems identified):   Adherence assessment ([facility] MEDICATION ADHERENCE SCALE?)   1. Patient reports taking medications as prescribed: ***(all the time, most of the time, some of the time, infrequently, never)   2. Patient has insurance and/or the ability to afford the prescribed medications: ***Y/N   3. Patient has adequate transportation to obtain medications: ***Y/N   4. Patient is not experiencing any adverse effects that limit adherence: ***Y/N   5. Patient understands why medications are prescribed/their importance: ***Y/N     (Problem 1):   (Problem 2):   (Problem 3):   PLAN   (Plan 1):   (Plan 2):   (Plan 3):   SUBJECTIVE   HPI:   Past Medical History:  Diagnosis Date  . (HFpEF) heart failure with preserved ejection fraction ([place])    a. 11/2014 Echo: EF 50-55%, Gr1 DD; b. 06/2016 Echo: EF 60-65%, no rwma, mod dil LA, nl RV, triv eff.  . Asthma   . CHF (congestive heart failure) ([place])   . CKD (chronic kidney disease), stage III ([place])   . COPD (chronic obstructive pulmonary disease) ([place])    a. Home O2.  . Diabetes mellitus without complication ([place])   . Hypertension   . Iron deficiency anemia    a. chronic blood loss->heavy menses.  . Morbid obesity ([place])   . Pericardial effusion    a. 11/2014 CT Chest: Mod effusion; b. 11/2014 Echo: small to mod circumferential effusion. No hemodynamic compromise.  [name] Poorly controlled diabetes mellitus ([place])    a. 11/2014 A1c 13.2.      Current Outpatient Medications:  .  acetaminophen (TYLENOL) 500 MG tablet, Take 1,000 mg by mouth every 6 (six) hours as needed for mild pain, fever or headache., Disp: , Rfl:  .  albuterol (PROVENTIL HFA;VENTOLIN HFA) 108 (90 Base) MCG/ACT inhaler, Inhale 2 puffs into the lungs every 6 (six) hours as needed for wheezing or shortness of breath., Disp: 1  Inhaler, Rfl: 3 .  albuterol (PROVENTIL) (2.5 MG/3ML) 0.083% nebulizer solution, Take 2.5 mg by nebulization every 6 (six) hours as needed for wheezing or shortness of breath., Disp: , Rfl:  .  aspirin EC 81 MG tablet, Take 81 mg by mouth daily. , Disp: , Rfl:  .  b complex-vitamin c-folic acid (NEPHRO-VITE) 0.8 MG TABS tablet, Take 1 tablet by mouth at bedtime., Disp: , Rfl:  .  budesonide-formoterol (SYMBICORT) 160-4.5 MCG/ACT inhaler, Inhale 2 puffs into the lungs 2 (two) times daily., Disp: , Rfl:  .  Cholecalciferol (HM VITAMIN D3) 4000 units CAPS, Take 1 capsule by mouth once a week., Disp: , Rfl:  .  cloNIDine (CATAPRES) 0.2 MG tablet, Take 1 tablet (0.2 mg total) by mouth 2 (two) times daily., Disp: 30 tablet, Rfl: 0 .  docusate sodium (COLACE) 100 MG capsule, Take 100 mg by mouth daily., Disp: , Rfl:  .  ferric citrate (AURYXIA) 1 GM 210 MG(Fe) tablet, Take 210 mg by mouth 3 (three) times daily with meals., Disp: , Rfl:  .  guaiFENesin-dextromethorphan (ROBITUSSIN DM) 100-10 MG/5ML syrup, Take 5 mLs every 4 (four) hours as needed by mouth for cough., Disp: 118 mL, Rfl: 0 .  insulin aspart (NOVOLOG) 100 UNIT/ML injection, Inject 0-5 Units into the skin at bedtime., Disp: 10 mL, Rfl: 11 .  insulin aspart (NOVOLOG) 100 UNIT/ML injection, Inject 0-15 Units into the skin 3 (three)  times daily with meals., Disp: 10 mL, Rfl: 11 .  insulin degludec (TRESIBA) 100 UNIT/ML SOPN FlexTouch Pen, Inject 38 Units into the skin daily at 10 pm., Disp: , Rfl:  .  ipratropium-albuterol (DUONEB) 0.5-2.5 (3) MG/3ML SOLN, Take 3 mLs by nebulization every 6 (six) hours as needed., Disp: 360 mL, Rfl: 5 .  lactulose (CHRONULAC) 10 GM/15ML solution, Take 30 mLs (20 g total) by mouth daily as needed for severe constipation., Disp: 240 mL, Rfl: 0 .  lidocaine-prilocaine (EMLA) cream, Apply topically as needed (For access for dialysis)., Disp: , Rfl:  .  losartan (COZAAR) 50 MG tablet, Take 50 mg by mouth 2 (two) times  daily. On dialysis days, patient is taking in the evening, Disp: , Rfl:  .  metoprolol tartrate (LOPRESSOR) 100 MG tablet, Take 1 tablet (100 mg total) by mouth 2 (two) times daily., Disp: 60 tablet, Rfl: 0 .  mometasone-formoterol (DULERA) 200-5 MCG/ACT AERO, Inhale 2 puffs into the lungs 2 (two) times daily. Rinse mouth after use., Disp: 1 Inhaler, Rfl: 5 .  OXYGEN, Inhale 2-4 L into the lungs., Disp: , Rfl:  .  polyethylene glycol (MIRALAX / GLYCOLAX) packet, Take 17 g by mouth daily as needed for mild constipation., Disp: 14 each, Rfl: 0 .  torsemide (DEMADEX) 100 MG tablet, Take 1 tablet (100 mg total) by mouth 2 (two) times daily., Disp: 180 tablet, Rfl: 0    OBJECTIVE    BMP Latest Ref Rng & Units 04/26/2017 04/21/2017 04/18/2017  Glucose 65 - 99 mg/dL 266(H) 188(H) 199(H)  BUN 6 - 20 mg/dL 46(H) 56(H) 72(H)  Creatinine 0.44 - 1.00 mg/dL 3.97(H) 3.93(H) 4.30(H)  Sodium 135 - 145 mmol/L 131(L) 130(L) 137  Potassium 3.5 - 5.1 mmol/L 5.0 4.4 4.7  Chloride 101 - 111 mmol/L 95(L) 90(L) 95(L)  CO2 22 - 32 mmol/L 26 30 32  Calcium 8.9 - 10.3 mg/dL 8.7(L) 8.5(L) 8.5(L)    Vital signs: HR ***, BP ***, weight (kg) ***  PE: Deferred  ECHO: Date ***, EF ***, notes ***  Cath: Date ***, EF ***, notes ***   DRUGS TO AVOID IN HEART FAILURE Anti-inflammatory medications  Glucocorticoids   Sodium retention (particularly with fludrocortisone, hydrocortisone)  NSAIDS    Sodium retention and peripheral vasoconstriction; blunted response to diuretics and ACE inhibitors Cardiovascular medications  Class I antiarrhythmic agents  Negative inotropy, proarrhythmia, increased mortality with 1A and 1C agents in post-MI trials  Class III antiarrhythmic agents  Proarrhythmia  Calcium channel blockers  Negative inotropy, neurohumoral activation  Minoxidil    Sodium retention, neurohumoral activation Diabetes medications  Metformin    Lactic acidosis  Thiazolidinediones   Sodium retention Phosphodiestease  inhibitors  Anagrelide    Palpitations, tachycardia, sodium retention, induction or exacerbation of HF  Cilostazol    Ventricular tachyarrhythmias Neurologic and psychiatric medications  Amphetamines   Sympathetic agonist activity, hypertension, tachycardia, tachyarrhythmia  Carbamazepine   Negative inotropy, bradyarrhythmia  Clozapine    Development of myocarditis and cardiomyopathy  Ergot alkaloids   Sympathetic agonist activity, valve fibrosis  Tricyclic antidepressants  Negative inotropy, proarrhythmia Miscellaneous  Beta-2 agonists   Sympathetic agonist activity, tachyarrhythmias, hypokalemia  SMZ/TMP    Risk of hyperkalemia or AKI when taken with ACE/ARB or MRA  Itraconazole    Negative inotropy, can increase serum digoxin concentration  Licorice    Mineralocorticoid excess, sodium retention, hypokalemia  Sodium-containing preparations  Sodium load QT prolonging drugs Proarrhythmia   COUNSELING POINTS/CLINICAL PEARLS (*** DELETE ANY DRUGS NOT  ON PATIENTS MEDICATION LIST***) Carvedilol  Patient should avoid activities requiring coordination until drug effects are realized, as drug may cause dizziness.  This drug may cause diarrhea, nausea, vomiting, arthralgia, back pain, myalgia, headache, vision disorder, erectile dysfunction, reduced libido, or fatigue.  Instruct patient to report signs/symptoms of adverse cardiovascular effects such as hypotension (especially in elderly patients), arrhythmias, syncope, palpitations, angina, or edema.  Drug may mask symptoms of hypoglycemia. Advise diabetic patients to carefully monitor blood sugar levels.  Patient should take drug with food.  Advise patient against sudden discontinuation of drug. Bisoprolol Patient should avoid activities requiring mental alertness or coordination until drug effects are realized.  This drug may cause bradyarrhythmia, cold extremities, hypotension, dyspepsia, dizziness, headache, dyssomnia, upper respiratory  infection, or fatigue.  Drug may mask symptoms of hypoglycemia. Advise diabetic patients to carefully monitor blood sugar levels.  Advise patient against sudden discontinuation of drug.  Instruct patient to take a missed dose as soon as possible, but if next dose is in less than 8 h, skip the missed dose. Metoprolol Succinate  Warn patient to avoid activities requiring mental alertness or coordination until drug effects are realized, as drug may cause dizziness. Tell patient planning major surgery with anesthesia to alert physician that drug is being used, as drug impairs ability of heart to respond to reflex adrenergic stimuli. Drug may cause diarrhea, fatigue, headache, or depression. Advise diabetic patient to carefully monitor blood glucose as drug may mask symptoms of hypoglycemia. Patient should take extended-release tablet with or immediately following meals. Counsel patient against sudden discontinuation of drug, as this may precipitate hypertension, angina, or myocardial infarction. In the event of a missed dose, counsel patient to skip the missed dose and maintain a regular dosing schedule. Lisinopril  This drug may cause nausea, vomiting, dizziness, headache, or angioedema of face, lips, throat, or intestines.  Instruct patient to report signs/symptoms of hypotension, or a persistent cough.  Advise patient against sudden discontinuation of drug. Enalapril  Patient should avoid activities requiring mental alertness or coordination until drug effects are realized.  Instruct patient to rise slowly from a sitting/supine position, as drug may cause orthostatic hypotension.  This drug may cause nausea, vomiting, diarrhea, fatigue, rash, dizziness, headache, or asthenia.  Instruct patient to report signs/symptoms of hypotension (lightheadedness or syncope) or persistent cough.  Tell patient to report symptoms of angioedema (swelling of face, extremities, eyes, lips, or tongue, or difficulty  in swallowing or breathing) or intestinal angioedema (abdominal pain).  Instruct patient to report symptoms of hepatic failure (jaundice) or acute renal failure.  Advise patient to maintain adequate hydration during therapy to prevent volume depletion and an excessive fall in blood pressure.  Instruct patient to immediately report signs/symptoms of infection (sore throat or fever).  Instruct patients/caregivers on the preparation method for the oral solution or suspension and inform them of the expiration date following reconstitution.  Patient should avoid use of potassium-sparing diuretics or potassium-containing supplements or salt substitutes without first consulting their healthcare provider, as the drug may cause increased potassium levels. Losartan  Warn female patient to avoid pregnancy and to report a pregnancy that occurs during therapy.  Side effects may include dizziness, upper respiratory infection, nasal congestion, and back pain.  Warn patient to avoid use of potassium supplements or potassium-containing salt substitutes unless they consult healthcare provider. Valsartan  Advise patient to report lightheadedness or syncope.  Tell patient to avoid activities requiring coordination until drug effects are realized, as this medicine may cause dizziness.  Side effects may include abdominal pain, diarrhea, hypotension, headache, cough, or fatigue.  Advise patient to avoid potassium supplements and foods/salt substitutes that are high in potassium. Sarina Ill female patient to avoid pregnancy during therapy and to report a pregnancy to a physician.  Advise patient to report symptomatic hypotension.  Side effects may include hyperkalemia, cough, dizziness, or renal failure. Furosemide  Drug causes sun-sensitivity. Advise patient to use sunscreen and avoid tanning beds. Patient should avoid activities requiring coordination until drug effects are realized, as drug may cause dizziness,  vertigo, or blurred vision. This drug may cause hyperglycemia, hyperuricemia, constipation, diarrhea, loss of appetite, nausea, vomiting, purpuric disorder, cramps, spasticity, asthenia, headache, paresthesia, or scaling eczema. Instruct patient to report unusual bleeding/bruising or signs/symptoms of hypotension, infection, pancreatitis, or ototoxicity (tinnitus, hearing impairment). Advise patient to report signs/symptoms of a severe skin reactions (flu-like symptoms, spreading red rash, or skin/mucous membrane blistering) or erythema multiforme. Instruct patient to eat high-potassium foods during drug therapy, as directed by healthcare professional.  Patient should not drink alcohol while taking this drug. Torsemide  Side effects may include excessive urination.  Tell patient to report symptoms of ototoxicity.  Instruct patient to report lightheadedness or syncope.  Warn patient to avoid use of nonprescription NSAID products without first discussing it with their healthcare provider. Spironolactone  Warn patient to report dehydration, hypotension, or symptoms of worsening renal function.  Counsel female patient to report gynecomastia.  Side effects may include diarrhea, nausea, vomiting, abdominal cramping, fever, leg cramps, lethargy, mental confusion, decreased libido, irregular menses, and rash. Suspension: Tell patient to take drug consistently with respect to food, either before or after a meal.  Advise patient to avoid potassium supplements and foods containing high levels of potassium, including salt substitutes. Eplerenone  Patient should avoid activities requiring coordination until drug effects are realized, as drug may cause dizziness.  This drug may cause diarrhea, headache, cough, fatigue, influenza-like illness, angina, or myocardial infarction.  Patient should avoid potassium supplements, potassium-containing salt substitutes and other potassium-sparing diuretics during  therapy.   MEDICATION ADHERENCES TIPS AND STRATEGIES 1. Taking medication as prescribed improves patient outcomes in heart failure (reduces hospitalizations, improves symptoms, increases survival) 2. Side effects of medications can be managed by decreasing doses, switching agents, stopping drugs, or adding additional therapy. Please let someone in the [facility] know if you have having bothersome side effects so we can modify your regimen. Do not alter your medication regimen without talking to [place].  3. Medication reminders can help patients remember to take drugs on time. If you are missing or forgetting doses you can try linking behaviors, using pill boxes, or an electronic reminder like an alarm on your phone or an app. Some people can also get automated phone calls as medication reminders.   Time spent: ***  Laural Benes, Pharm.D. Clinical Pharmacist @DATE @

## 2018-01-07 NOTE — Progress Notes (Signed)
Bgl: [phone]  Today is visit at [facility] office.  She states feels ok, she states gaining weight, discussed diet, but she is having a hard time eating right.  Been trying to work with her. She states watching her fluid intake.  She states compliant with meds.  She is compliant with her dialysis treatment, she has appt in a few weeks to discuss kidney transplant.  She states not exercising, she is thinking about joining the gym to walk on tread mill or ride stationary bike.  Discussed holidays coming up and watching what we eat.  Discussed healthier choices.  Will continue to visit and educate on diet and heart failure.   [place] [phone]

## 2018-01-12 ENCOUNTER — Encounter (INDEPENDENT_AMBULATORY_CARE_PROVIDER_SITE_OTHER): Payer: Self-pay | Admitting: Nurse Practitioner

## 2018-01-12 ENCOUNTER — Ambulatory Visit (INDEPENDENT_AMBULATORY_CARE_PROVIDER_SITE_OTHER): Payer: Medicare Other

## 2018-01-12 ENCOUNTER — Ambulatory Visit (INDEPENDENT_AMBULATORY_CARE_PROVIDER_SITE_OTHER): Payer: Medicaid Other | Admitting: Nurse Practitioner

## 2018-01-12 VITALS — BP 130/75 | HR 80 | Resp 16 | Ht 64.0 in | Wt 286.0 lb

## 2018-01-12 DIAGNOSIS — Z794 Long term (current) use of insulin: Secondary | ICD-10-CM

## 2018-01-12 DIAGNOSIS — J4489 Other specified chronic obstructive pulmonary disease: Secondary | ICD-10-CM

## 2018-01-12 DIAGNOSIS — J449 Chronic obstructive pulmonary disease, unspecified: Secondary | ICD-10-CM | POA: Diagnosis not present

## 2018-01-12 DIAGNOSIS — N186 End stage renal disease: Secondary | ICD-10-CM

## 2018-01-12 DIAGNOSIS — Z992 Dependence on renal dialysis: Secondary | ICD-10-CM | POA: Diagnosis not present

## 2018-01-12 DIAGNOSIS — E1122 Type 2 diabetes mellitus with diabetic chronic kidney disease: Secondary | ICD-10-CM

## 2018-01-12 NOTE — Progress Notes (Signed)
Subjective:    Patient ID: [name], female    DOB: 1970-07-25, 47 y.o.   MRN: [id number] Chief Complaint  Patient presents with  . Follow-up    23moth ultrasound follow up    HPI  [name] is a 47 y.o. female The patient returns to the office for follow up regarding problem with the dialysis access. Currently the patient is maintained via a left brachiocephalic AV fistula  The patient has had multiple failed upper extremity accesses.  The patient notes a significant increase in bleeding time after decannulation.  The patient has also been informed that there is increased recirculation.    The patient denies hand pain or other symptoms consistent with steal phenomena.  No significant arm swelling.  The patient denies redness or swelling at the access site. The patient denies fever or chills at home or while on dialysis.  The patient denies amaurosis fugax or recent TIA symptoms. There are no recent neurological changes noted. The patient denies claudication symptoms or rest pain symptoms. The patient denies history of DVT, PE or superficial thrombophlebitis. The patient denies recent episodes of angina or shortness of breath.    She underwent a left upper extremity hemodialysis access duplex today, which revealed a flow volume of 936.  The distal upper arm had a change in diameter from 1.4 cm to 0.46 cm.  Previous studies done on 07/07/2017 show flow volume of 1441 with no significant areas of stenosis.   Past Medical History:  Diagnosis Date  . (HFpEF) heart failure with preserved ejection fraction ([place])    a. 11/2014 Echo: EF 50-55%, Gr1 DD; b. 06/2016 Echo: EF 60-65%, no rwma, mod dil LA, nl RV, triv eff.  . Asthma   . CHF (congestive heart failure) ([place])   . CKD (chronic kidney disease), stage III ([place])   . COPD (chronic obstructive pulmonary disease) ([place])    a. Home O2.  . Diabetes mellitus without complication ([place])   . Hypertension   . Iron deficiency anemia      a. chronic blood loss->heavy menses.  . Morbid obesity ([place])   . Pericardial effusion    a. 11/2014 CT Chest: Mod effusion; b. 11/2014 Echo: small to mod circumferential effusion. No hemodynamic compromise.  [name] Poorly controlled diabetes mellitus ([place])    a. 11/2014 A1c 13.2.    Past Surgical History:  Procedure Laterality Date  . A/V FISTULAGRAM Left 06/09/2017   Procedure: A/V FISTULAGRAM;  Surgeon: Algernon Huxley, MD;  Location: [facility];  Service: Cardiovascular;  Laterality: Left;  . A/V FISTULAGRAM Left 06/16/2017   Procedure: A/V FISTULAGRAM;  Surgeon: Algernon Huxley, MD;  Location: [facility];  Service: Cardiovascular;  Laterality: Left;  . AV FISTULA PLACEMENT Left 04/25/2017   Procedure: ARTERIOVENOUS (AV) FISTULA CREATION;  Surgeon: Algernon Huxley, MD;  Location: [facility];  Service: Vascular;  Laterality: Left;  . CESAREAN SECTION     times 3  . DIALYSIS/PERMA CATHETER INSERTION Right 04/04/2017   Procedure: DIALYSIS/PERMA CATHETER INSERTION;  Surgeon: Katha Cabal, MD;  Location: [facility];  Service: Cardiovascular;  Laterality: Right;  . DIALYSIS/PERMA CATHETER REMOVAL N/A 09/17/2017   Procedure: DIALYSIS/PERMA CATHETER REMOVAL;  Surgeon: Algernon Huxley, MD;  Location: [facility];  Service: Cardiovascular;  Laterality: N/A;  . INSERTION OF DIALYSIS CATHETER N/A 04/16/2017   Procedure: INSERTION OF DIALYSIS PERM CATHETER;  Surgeon: Algernon Huxley, MD;  Location: [facility];  Service: Vascular;  Laterality: N/A;    Social History   Socioeconomic History  . Marital status: Married    Spouse name: Not on file  . Number of children: Not on file  . Years of education: 82  . Highest education level: High school graduate  Occupational History  . Occupation: Disabled  Social Needs  . Financial resource strain: Very hard  . Food insecurity:    Worry: Often true    Inability: Often true  . Transportation needs:    Medical: Yes     Non-medical: Yes  Tobacco Use  . Smoking status: Never Smoker  . Smokeless tobacco: Never Used  Substance and Sexual Activity  . Alcohol use: Yes    Alcohol/week: 0.0 standard drinks    Comment: occasional  . Drug use: Yes    Types: Marijuana  . Sexual activity: Not on file  Lifestyle  . Physical activity:    Days per week: 0 days    Minutes per session: 0 min  . Stress: Not at all  Relationships  . Social connections:    Talks on phone: More than three times a week    Gets together: Once a week    Attends religious service: Never    Active member of club or organization: No    Attends meetings of clubs or organizations: Never    Relationship status: Married  . Intimate partner violence:    Fear of current or ex partner: Patient refused    Emotionally abused: Patient refused    Physically abused: Patient refused    Forced sexual activity: Patient refused  Other Topics Concern  . Not on file  Social History Narrative   Lives in [place] with her husband and 52 y/o child.  Three other children are grown and out of the house.  She is sedentary and does not routinely exercise.  On home O2.    Family History  Problem Relation Age of Onset  . Diabetes Mother   . Hypertension Mother   . Hypertension Father   . Diabetes Father   . Hypertension Unknown   . Diabetes Unknown   . Breast cancer Maternal Aunt 28  . Breast cancer Maternal Aunt 50    Allergies  Allergen Reactions  . Dust Mite Extract Shortness Of Breath and Itching  . Mold Extract [Trichophyton] Shortness Of Breath and Itching  . Feraheme [Ferumoxytol] Itching  . Pollen Extract Other (See Comments)    Unknown  . Sulfa Antibiotics Itching     Review of Systems   Review of Systems: Negative Unless Checked Constitutional: [] Weight loss  [] Fever  [] Chills Cardiac: [] Chest pain   []  Atrial Fibrillation  [] Palpitations   [] Shortness of breath when laying flat   [] Shortness of breath with exertion. Vascular:   [] Pain in legs with walking   [] Pain in legs with standing  [] History of DVT   [] Phlebitis   [] Swelling in legs   [] Varicose veins   [] Non-healing ulcers Pulmonary:   [x] Uses home oxygen   [] Productive cough   [] Hemoptysis   [] Wheeze  [x] COPD   [x] Asthma Neurologic:  [] Dizziness   [] Seizures   [] History of stroke   [] History of TIA  [] Aphasia   [] Vissual changes   [] Weakness or numbness in arm   [] Weakness or numbness in leg Musculoskeletal:   [] Joint swelling   [] Joint pain   [] Low back pain  []  History of Knee Replacement Hematologic:  [] Easy bruising  [] Easy bleeding   [] Hypercoagulable state   [  x]Anemic Gastrointestinal:  [] Diarrhea   [] Vomiting  [] Gastroesophageal reflux/heartburn   [] Difficulty swallowing. Genitourinary:  [] Chronic kidney disease   [] Difficult urination  [] Anuric   [] Blood in urine Skin:  [] Rashes   [] Ulcers  Psychological:  [x] History of anxiety   [x]  History of major depression  []  Memory Difficulties     Objective:   Physical Exam  BP 130/75 (BP Location: Right Arm)   Pulse 80   Resp 16   Ht 5\' 4"  (1.626 m)   Wt 286 lb (129.7 kg)   LMP 09/06/2016 (Approximate)   BMI 49.09 kg/m   Gen: WD/WN, NAD Head: Xenia/AT, No temporalis wasting.  Ear/Nose/Throat: Hearing grossly intact, nares w/o erythema or drainage Eyes: PER, EOMI, sclera nonicteric.  Neck: Supple, no masses.  No JVD.  Pulmonary:  Good air movement, no use of accessory muscles.  Cardiac: RRR Vascular: Good thrill and bruit near proximal end of AVF.  Poor Thrill and soft bruit at distal end Vessel Right Left  Radial Palpable Palpable   Gastrointestinal: soft, non-distended. No guarding/no peritoneal signs.  Musculoskeletal: M/S 5/5 throughout.  No deformity or atrophy.  Neurologic: Pain and light touch intact in extremities.  Symmetrical.  Speech is fluent. Motor exam as listed above. Psychiatric: Judgment intact, Mood & affect appropriate for pt's clinical situation. Dermatologic: No Venous rashes.  No Ulcers Noted.  No changes consistent with cellulitis. Lymph : No Cervical lymphadenopathy, no lichenification or skin changes of chronic lymphedema.      Assessment & Plan:   1. ESRD (end stage renal disease) ([place]) Recommend:  The patient is experiencing increasing problems with their dialysis access.  Patient should have a fistulagram with the intention for intervention.  The intention for intervention is to restore appropriate flow and prevent thrombosis and possible loss of the access.  As well as improve the quality of dialysis therapy.  The risks, benefits and alternative therapies were reviewed in detail with the patient.  All questions were answered.  The patient agrees to proceed with angio/intervention.      2. Type 2 diabetes mellitus with chronic kidney disease on chronic dialysis, with long-term current use of insulin (HCC) Continue hypoglycemic medications as already ordered, these medications have been reviewed and there are no changes at this time.  Hgb A1C to be monitored as already arranged by primary service   3. COPD with chronic bronchitis ([place]) Continue pulmonary medications and aerosols as already ordered, these medications have been reviewed and there are no changes at this time.     Current Outpatient Medications on File Prior to Visit  Medication Sig Dispense Refill  . acetaminophen (TYLENOL) 500 MG tablet Take 1,000 mg by mouth every 6 (six) hours as needed for mild pain, fever or headache.    . albuterol (PROVENTIL HFA;VENTOLIN HFA) 108 (90 Base) MCG/ACT inhaler Inhale 2 puffs into the lungs every 6 (six) hours as needed for wheezing or shortness of breath. 1 Inhaler 3  . albuterol (PROVENTIL) (2.5 MG/3ML) 0.083% nebulizer solution Take 2.5 mg by nebulization every 6 (six) hours as needed for wheezing or shortness of breath.    [name] aspirin EC 81 MG tablet Take 81 mg by mouth daily.     [name] b complex-vitamin c-folic acid (NEPHRO-VITE) 0.8 MG TABS tablet  Take 1 tablet by mouth at bedtime.    . budesonide-formoterol (SYMBICORT) 160-4.5 MCG/ACT inhaler Inhale 2 puffs into the lungs 2 (two) times daily.    . Cholecalciferol (HM VITAMIN D3) 4000 units CAPS Take  1 capsule by mouth once a week.    . cloNIDine (CATAPRES) 0.2 MG tablet Take 1 tablet (0.2 mg total) by mouth 2 (two) times daily. 30 tablet 0  . docusate sodium (COLACE) 100 MG capsule Take 100 mg by mouth daily.    . ferric citrate (AURYXIA) 1 GM 210 MG(Fe) tablet Take 210 mg by mouth 3 (three) times daily with meals.    [name] guaiFENesin-dextromethorphan (ROBITUSSIN DM) 100-10 MG/5ML syrup Take 5 mLs every 4 (four) hours as needed by mouth for cough. 118 mL 0  . insulin aspart (NOVOLOG) 100 UNIT/ML injection Inject 0-5 Units into the skin at bedtime. 10 mL 11  . insulin aspart (NOVOLOG) 100 UNIT/ML injection Inject 0-15 Units into the skin 3 (three) times daily with meals. 10 mL 11  . insulin degludec (TRESIBA) 100 UNIT/ML SOPN FlexTouch Pen Inject 38 Units into the skin daily at 10 pm.    . ipratropium-albuterol (DUONEB) 0.5-2.5 (3) MG/3ML SOLN Take 3 mLs by nebulization every 6 (six) hours as needed. 360 mL 5  . lactulose (CHRONULAC) 10 GM/15ML solution Take 30 mLs (20 g total) by mouth daily as needed for severe constipation. 240 mL 0  . lidocaine-prilocaine (EMLA) cream Apply topically as needed (For access for dialysis).    [name] losartan (COZAAR) 50 MG tablet Take 50 mg by mouth 2 (two) times daily. On dialysis days, patient is taking in the evening    . metoprolol tartrate (LOPRESSOR) 100 MG tablet Take 1 tablet (100 mg total) by mouth 2 (two) times daily. 60 tablet 0  . mometasone-formoterol (DULERA) 200-5 MCG/ACT AERO Inhale 2 puffs into the lungs 2 (two) times daily. Rinse mouth after use. 1 Inhaler 5  . OXYGEN Inhale 2-4 L into the lungs.    . polyethylene glycol (MIRALAX / GLYCOLAX) packet Take 17 g by mouth daily as needed for mild constipation. 14 each 0  . torsemide (DEMADEX) 100 MG  tablet Take 1 tablet (100 mg total) by mouth 2 (two) times daily. 180 tablet 0   No current facility-administered medications on file prior to visit.     There are no Patient Instructions on file for this visit. No follow-ups on file.   [name] Hartmann, NP  This note was completed with Sales executive.  Any errors are purely unintentional.

## 2018-01-14 ENCOUNTER — Encounter (INDEPENDENT_AMBULATORY_CARE_PROVIDER_SITE_OTHER): Payer: Self-pay

## 2018-01-14 ENCOUNTER — Encounter: Payer: Self-pay | Admitting: *Deleted

## 2018-01-21 ENCOUNTER — Ambulatory Visit
Admission: RE | Admit: 2018-01-21 | Discharge: 2018-01-21 | Disposition: A | Discharge status: Home / Self Care | Payer: Medicare Other | Source: Ambulatory Visit | Attending: Ophthalmology | Admitting: Ophthalmology

## 2018-01-21 ENCOUNTER — Ambulatory Visit: Payer: Medicare Other | Admitting: Certified Registered Nurse Anesthetist

## 2018-01-21 ENCOUNTER — Encounter
Admission: RE | Disposition: A | Discharge status: Home / Self Care | Payer: Self-pay | Source: Ambulatory Visit | Attending: Ophthalmology

## 2018-01-21 ENCOUNTER — Encounter: Payer: Self-pay | Admitting: Anesthesiology

## 2018-01-21 DIAGNOSIS — Z6841 Body Mass Index (BMI) 40.0 and over, adult: Secondary | ICD-10-CM | POA: Insufficient documentation

## 2018-01-21 DIAGNOSIS — Z794 Long term (current) use of insulin: Secondary | ICD-10-CM | POA: Insufficient documentation

## 2018-01-21 DIAGNOSIS — I509 Heart failure, unspecified: Secondary | ICD-10-CM | POA: Diagnosis not present

## 2018-01-21 DIAGNOSIS — Z9981 Dependence on supplemental oxygen: Secondary | ICD-10-CM | POA: Diagnosis not present

## 2018-01-21 DIAGNOSIS — E1122 Type 2 diabetes mellitus with diabetic chronic kidney disease: Secondary | ICD-10-CM | POA: Insufficient documentation

## 2018-01-21 DIAGNOSIS — E1136 Type 2 diabetes mellitus with diabetic cataract: Secondary | ICD-10-CM | POA: Diagnosis present

## 2018-01-21 DIAGNOSIS — Z79899 Other long term (current) drug therapy: Secondary | ICD-10-CM | POA: Insufficient documentation

## 2018-01-21 DIAGNOSIS — I132 Hypertensive heart and chronic kidney disease with heart failure and with stage 5 chronic kidney disease, or end stage renal disease: Secondary | ICD-10-CM | POA: Insufficient documentation

## 2018-01-21 DIAGNOSIS — J449 Chronic obstructive pulmonary disease, unspecified: Secondary | ICD-10-CM | POA: Diagnosis not present

## 2018-01-21 DIAGNOSIS — Z992 Dependence on renal dialysis: Secondary | ICD-10-CM | POA: Diagnosis not present

## 2018-01-21 DIAGNOSIS — H2512 Age-related nuclear cataract, left eye: Secondary | ICD-10-CM | POA: Insufficient documentation

## 2018-01-21 DIAGNOSIS — N186 End stage renal disease: Secondary | ICD-10-CM | POA: Insufficient documentation

## 2018-01-21 DIAGNOSIS — Z882 Allergy status to sulfonamides status: Secondary | ICD-10-CM | POA: Diagnosis not present

## 2018-01-21 HISTORY — DX: Dependence on supplemental oxygen: Z99.81

## 2018-01-21 HISTORY — DX: Major depressive disorder, single episode, unspecified: F32.9

## 2018-01-21 HISTORY — DX: Other specified soft tissue disorders: M79.89

## 2018-01-21 HISTORY — DX: Depression, unspecified: F32.A

## 2018-01-21 HISTORY — DX: Cough: R05

## 2018-01-21 HISTORY — DX: Orthopnea: R06.01

## 2018-01-21 HISTORY — DX: Other allergic rhinitis: J30.89

## 2018-01-21 HISTORY — DX: Dyspnea, unspecified: R06.00

## 2018-01-21 HISTORY — DX: Unspecified kidney failure: N19

## 2018-01-21 HISTORY — DX: Bronchitis, not specified as acute or chronic: J40

## 2018-01-21 HISTORY — PX: CATARACT EXTRACTION W/PHACO: SHX586

## 2018-01-21 HISTORY — DX: Chronic cough: R05.3

## 2018-01-21 HISTORY — DX: Anxiety disorder, unspecified: F41.9

## 2018-01-21 HISTORY — DX: Pneumonia, unspecified organism: J18.9

## 2018-01-21 HISTORY — DX: Cardiac arrhythmia, unspecified: I49.9

## 2018-01-21 HISTORY — DX: Diverticulosis of intestine, part unspecified, without perforation or abscess without bleeding: K57.90

## 2018-01-21 LAB — POCT I-STAT 4, (NA,K, GLUC, HGB,HCT)
Glucose, Bld: 82 mg/dL (ref 70–99)
HCT: 34 % — ABNORMAL LOW (ref 36.0–46.0)
HEMOGLOBIN: 11.6 g/dL — AB (ref 12.0–15.0)
Potassium: 4.3 mmol/L (ref 3.5–5.1)
Sodium: 135 mmol/L (ref 135–145)

## 2018-01-21 LAB — GLUCOSE, CAPILLARY: Glucose-Capillary: 88 mg/dL (ref 70–99)

## 2018-01-21 SURGERY — PHACOEMULSIFICATION, CATARACT, WITH IOL INSERTION
Anesthesia: Monitor Anesthesia Care | Site: Eye | Laterality: Left

## 2018-01-21 MED ORDER — MIDAZOLAM HCL 2 MG/2ML IJ SOLN
INTRAMUSCULAR | Status: AC
  Filled 2018-01-21: qty 2

## 2018-01-21 MED ORDER — POVIDONE-IODINE 5 % OP SOLN
OPHTHALMIC | Status: DC | PRN
  Administered 2018-01-21: 1 via OPHTHALMIC

## 2018-01-21 MED ORDER — ARMC OPHTHALMIC DILATING DROPS
1.0000 "application " | OPHTHALMIC | Status: AC
  Administered 2018-01-21 (×3): 1 via OPHTHALMIC

## 2018-01-21 MED ORDER — EPINEPHRINE PF 1 MG/ML IJ SOLN
INTRAOCULAR | Status: DC | PRN
  Administered 2018-01-21: 10:00:00 via OPHTHALMIC

## 2018-01-21 MED ORDER — MOXIFLOXACIN HCL 0.5 % OP SOLN
OPHTHALMIC | Status: AC
  Administered 2018-01-21: 1 [drp] via OPHTHALMIC
  Filled 2018-01-21: qty 6

## 2018-01-21 MED ORDER — NA HYALUR & NA CHOND-NA HYALUR 0.4-0.35 ML IO KIT
PACK | INTRAOCULAR | Status: DC | PRN
  Administered 2018-01-21: .5 mL via INTRAOCULAR

## 2018-01-21 MED ORDER — LIDOCAINE HCL (PF) 4 % IJ SOLN
INTRAOCULAR | Status: DC | PRN
  Administered 2018-01-21: 4 mL via OPHTHALMIC

## 2018-01-21 MED ORDER — TETRACAINE HCL 0.5 % OP SOLN
1.0000 [drp] | OPHTHALMIC | Status: AC
  Administered 2018-01-21 (×3): 1 [drp] via OPHTHALMIC

## 2018-01-21 MED ORDER — MOXIFLOXACIN HCL 0.5 % OP SOLN: 1.0000 [drp] | OPHTHALMIC | Status: DC | PRN

## 2018-01-21 MED ORDER — MOXIFLOXACIN HCL 0.5 % OP SOLN
1.0000 [drp] | OPHTHALMIC | Status: AC
  Administered 2018-01-21 (×3): 1 [drp] via OPHTHALMIC

## 2018-01-21 MED ORDER — MIDAZOLAM HCL 2 MG/2ML IJ SOLN
INTRAMUSCULAR | Status: DC | PRN
  Administered 2018-01-21: 1 mg via INTRAVENOUS
  Administered 2018-01-21 (×2): 0.5 mg via INTRAVENOUS

## 2018-01-21 MED ORDER — ARMC OPHTHALMIC DILATING DROPS
OPHTHALMIC | Status: AC
  Administered 2018-01-21: 1 via OPHTHALMIC
  Filled 2018-01-21: qty 0.5

## 2018-01-21 MED ORDER — SODIUM CHLORIDE 0.9 % IV SOLN
INTRAVENOUS | Status: DC
  Administered 2018-01-21: 20 mL/h via INTRAVENOUS

## 2018-01-21 MED ORDER — NEOMYCIN-POLYMYXIN-DEXAMETH 0.1 % OP OINT
TOPICAL_OINTMENT | OPHTHALMIC | Status: DC | PRN
  Administered 2018-01-21: 1 via OPHTHALMIC

## 2018-01-21 MED ORDER — TETRACAINE HCL 0.5 % OP SOLN
OPHTHALMIC | Status: AC
  Administered 2018-01-21: 1 [drp] via OPHTHALMIC
  Filled 2018-01-21: qty 4

## 2018-01-21 MED ORDER — EPINEPHRINE PF 1 MG/ML IJ SOLN
INTRAMUSCULAR | Status: AC
  Filled 2018-01-21: qty 2

## 2018-01-21 MED ORDER — FENTANYL CITRATE (PF) 100 MCG/2ML IJ SOLN
INTRAMUSCULAR | Status: AC
  Filled 2018-01-21: qty 2

## 2018-01-21 MED ORDER — LIDOCAINE HCL (PF) 4 % IJ SOLN
INTRAMUSCULAR | Status: AC
  Filled 2018-01-21: qty 5

## 2018-01-21 MED ORDER — NEOMYCIN-POLYMYXIN-DEXAMETH 3.5-10000-0.1 OP OINT
TOPICAL_OINTMENT | OPHTHALMIC | Status: AC
  Filled 2018-01-21: qty 3.5

## 2018-01-21 MED ORDER — NA HYALUR & NA CHOND-NA HYALUR 0.55-0.5 ML IO KIT
PACK | INTRAOCULAR | Status: AC
  Filled 2018-01-21: qty 1.05

## 2018-01-21 MED ORDER — POVIDONE-IODINE 5 % OP SOLN
OPHTHALMIC | Status: AC
  Filled 2018-01-21: qty 30

## 2018-01-21 SURGICAL SUPPLY — 18 items
GLOVE BIO SURGEON STRL SZ8 (GLOVE) ×3 IMPLANT
GLOVE BIOGEL M 6.5 STRL (GLOVE) ×3 IMPLANT
GLOVE SURG LX 7.5 STRW (GLOVE) ×2
GLOVE SURG LX STRL 7.5 STRW (GLOVE) ×1 IMPLANT
GOWN STRL REUS W/ TWL LRG LVL3 (GOWN DISPOSABLE) ×2 IMPLANT
GOWN STRL REUS W/TWL LRG LVL3 (GOWN DISPOSABLE) ×6
LABEL CATARACT MEDS ST (LABEL) ×3 IMPLANT
LENS IOL TECNIS ITEC 21.0 (Intraocular Lens) ×2 IMPLANT
NDL HPO THNWL 1X22GA REG BVL (NEEDLE) ×1 IMPLANT
NEEDLE SAFETY 22GX1 (NEEDLE) ×3
PACK CATARACT (MISCELLANEOUS) ×3 IMPLANT
PACK CATARACT BRASINGTON LX (MISCELLANEOUS) ×3 IMPLANT
PACK EYE AFTER SURG (MISCELLANEOUS) ×3 IMPLANT
SOL BSS BAG (MISCELLANEOUS) ×3
SOLUTION BSS BAG (MISCELLANEOUS) ×1 IMPLANT
SYR 5ML LL (SYRINGE) ×3 IMPLANT
WATER STERILE IRR 250ML POUR (IV SOLUTION) ×3 IMPLANT
WIPE NON LINTING 3.25X3.25 (MISCELLANEOUS) ×3 IMPLANT

## 2018-01-21 NOTE — Anesthesia Procedure Notes (Signed)
Procedure Name: MAC Performed by: Demetrius Charity, CRNA Pre-anesthesia Checklist: Patient identified, Emergency Drugs available, Timeout performed, Patient being monitored and Suction available Oxygen Delivery Method: Nasal cannula

## 2018-01-21 NOTE — Transfer of Care (Signed)
Immediate Anesthesia Transfer of Care Note  Patient: [name]  Procedure(s) Performed: CATARACT EXTRACTION PHACO AND INTRAOCULAR LENS PLACEMENT ([place]) (Left Eye)  Patient Location: PACU  Anesthesia Type:General  Level of Consciousness: awake, alert  and oriented  Airway & Oxygen Therapy: Patient Spontanous Breathing,, nasal cannula   Post-op Assessment: Report given to RN and Post -op Vital signs reviewed and stable  Post vital signs: Reviewed and stable  Last Vitals:  Vitals Value Taken Time  BP    Temp    Pulse    Resp    SpO2      Last Pain:  Vitals:   01/21/18 0910  TempSrc: Oral  PainSc: 0-No pain         Complications: No apparent anesthesia complications

## 2018-01-21 NOTE — Op Note (Signed)
OPERATIVE NOTE  JAMIN HUMPHRIES [id number] 01/21/2018   PREOPERATIVE DIAGNOSIS:  Nuclear sclerotic cataract left eye. H25.12   POSTOPERATIVE DIAGNOSIS:    Nuclear sclerotic cataract left eye.     PROCEDURE:  Phacoemusification with posterior chamber intraocular lens placement of the left eye   LENS:   Implant Name Type Inv. Item Serial No. Manufacturer Lot No. LRB No. Used  LENS IOL DIOP 21.0 - [id number] Intraocular Lens LENS IOL DIOP 21.0 [id number] AMO  Left 1        ULTRASOUND TIME: 14 % of 1 minutes, 6 seconds.  CDE 9.1   SURGEON:  Wyonia Hough, MD   ANESTHESIA:  Topical with tetracaine drops and 2% Xylocaine jelly, augmented with 1% preservative-free intracameral lidocaine.    COMPLICATIONS:  None.   DESCRIPTION OF PROCEDURE:  The patient was identified in the holding room and transported to the operating room and placed in the supine position under the operating microscope.  The left eye was identified as the operative eye and it was prepped and draped in the usual sterile ophthalmic fashion.   A 1 millimeter clear-corneal paracentesis was made at the 1:30 position. 0.5 ml of preservative-free 1% lidocaine was injected into the anterior chamber.  The anterior chamber was filled with Viscoat viscoelastic.  A 2.4 millimeter keratome was used to make a near-clear corneal incision at the 10:30 position.  .  A curvilinear capsulorrhexis was made with a cystotome and capsulorrhexis forceps.  Balanced salt solution was used to hydrodissect and hydrodelineate the nucleus.   Phacoemulsification was then used in stop and chop fashion to remove the lens nucleus and epinucleus.  The remaining cortex was then removed using the irrigation and aspiration handpiece. Provisc was then placed into the capsular bag to distend it for lens placement.  A lens was then injected into the capsular bag.  The remaining viscoelastic was aspirated.   Wounds were hydrated with balanced salt  solution.  The anterior chamber was inflated to a physiologic pressure with balanced salt solution. Vigamox 0.2 ml of a 1mg  per ml solution was injected into the anterior chamber for a dose of 0.2 mg of intracameral antibiotic at the completion of the case.  Miostat was placed into the anterior chamber to constrict the pupil.  No wound leaks were noted.  Topical Vigamox drops and Maxitrol ointment were applied to the eye.  The patient was taken to the recovery room in stable condition without complications of anesthesia or surgery  Lennyx Verdell 01/21/2018, 10:13 AM

## 2018-01-21 NOTE — Anesthesia Preprocedure Evaluation (Signed)
Anesthesia Evaluation  Patient identified by MRN, date of birth, ID band Patient awake    Reviewed: Allergy & Precautions, NPO status , [name] Chart, lab work & pertinent test results, reviewed documented beta blocker date and time   Airway Mallampati: III  TM Distance: >3 FB     Dental  (+) Chipped   Pulmonary shortness of breath, asthma , pneumonia, resolved, COPD,  oxygen dependent,           Cardiovascular hypertension, Pt. on medications and Pt. on home beta blockers +CHF and + Orthopnea  + dysrhythmias      Neuro/Psych PSYCHIATRIC DISORDERS Anxiety Depression    GI/Hepatic   Endo/Other  diabetes, Type 2Morbid obesity  Renal/GU ESRFRenal disease     Musculoskeletal   Abdominal   Peds  Hematology  (+) anemia ,   Anesthesia Other Findings   Reproductive/Obstetrics                             Anesthesia Physical Anesthesia Plan  ASA: III  Anesthesia Plan: MAC   Post-op Pain Management:    Induction:   PONV Risk Score and Plan:   Airway Management Planned:   Additional Equipment:   Intra-op Plan:   Post-operative Plan:   Informed Consent: I have reviewed the patients History and Physical, chart, labs and discussed the procedure including the risks, benefits and alternatives for the proposed anesthesia with the patient or authorized representative who has indicated his/her understanding and acceptance.     Plan Discussed with: CRNA  Anesthesia Plan Comments:         Anesthesia Quick Evaluation

## 2018-01-21 NOTE — Discharge Instructions (Addendum)
Eye Surgery Discharge Instructions  Expect mild scratchy sensation or mild soreness. DO NOT RUB YOUR EYE!  The day of surgery:  Minimal physical activity, but bed rest is not required  No reading, computer work, or close hand work  No bending, lifting, or straining.  May watch TV  For 24 hours:  No driving, legal decisions, or alcoholic beverages  Safety precautions  Eat anything you prefer: It is better to start with liquids, then soup then solid foods.  Solar shield eyeglasses should be worn for comfort in the sunlight/patch while sleeping  Resume all regular medications including aspirin or Coumadin if these were discontinued prior to surgery. You may shower, bathe, shave, or wash your hair. Tylenol may be taken for mild discomfort. FOLLOW DR. Larrie Kass EYE DROP INSTRUCTION SHEET AS REVIEWED.  Call your doctor if you experience significant pain, nausea, or vomiting, fever > 101 or other signs of infection. [phone] or [phone] Specific instructions:  Follow-up Information    Leandrew Koyanagi, MD Follow up.   Specialty:  Ophthalmology Why:  01/21/18 @ 3:05 PM Contact information: [address]   [place] [address] [phone]

## 2018-01-21 NOTE — Anesthesia Postprocedure Evaluation (Signed)
Anesthesia Post Note  Patient: [name]  Procedure(s) Performed: CATARACT EXTRACTION PHACO AND INTRAOCULAR LENS PLACEMENT ([facility]) (Left Eye)  Anesthesia Type: MAC     Last Vitals:  Vitals:   01/21/18 0910 01/21/18 1016  BP: 114/69 (!) 131/55  Pulse: 72 71  Resp: 16 16  Temp: (!) 36.1 C (!) 36.3 C  SpO2: 100% 100%    Last Pain:  Vitals:   01/21/18 1016  TempSrc: Temporal  PainSc: [place]

## 2018-01-21 NOTE — Anesthesia Post-op Follow-up Note (Signed)
Anesthesia QCDR form completed.        

## 2018-01-21 NOTE — H&P (Signed)
The History and Physical notes are on paper, have been signed, and are to be scanned. The patient remains stable and unchanged from the H&P.   Previous H&P reviewed, patient examined, and there are no changes.  [name] 01/21/2018 9:20 AM

## 2018-01-27 ENCOUNTER — Other Ambulatory Visit (INDEPENDENT_AMBULATORY_CARE_PROVIDER_SITE_OTHER): Payer: Self-pay | Admitting: Nurse Practitioner

## 2018-01-28 ENCOUNTER — Encounter: Payer: Self-pay | Admitting: Emergency Medicine

## 2018-01-28 ENCOUNTER — Encounter
Admission: RE | Disposition: A | Discharge status: Home / Self Care | Payer: Self-pay | Source: Ambulatory Visit | Attending: Vascular Surgery

## 2018-01-28 ENCOUNTER — Ambulatory Visit
Admission: RE | Admit: 2018-01-28 | Discharge: 2018-01-28 | Disposition: A | Discharge status: Home / Self Care | Payer: Medicare Other | Source: Ambulatory Visit | Attending: Vascular Surgery | Admitting: Vascular Surgery

## 2018-01-28 DIAGNOSIS — Y832 Surgical operation with anastomosis, bypass or graft as the cause of abnormal reaction of the patient, or of later complication, without mention of misadventure at the time of the procedure: Secondary | ICD-10-CM | POA: Diagnosis not present

## 2018-01-28 DIAGNOSIS — Z833 Family history of diabetes mellitus: Secondary | ICD-10-CM | POA: Diagnosis not present

## 2018-01-28 DIAGNOSIS — Z9981 Dependence on supplemental oxygen: Secondary | ICD-10-CM | POA: Insufficient documentation

## 2018-01-28 DIAGNOSIS — Z992 Dependence on renal dialysis: Secondary | ICD-10-CM | POA: Insufficient documentation

## 2018-01-28 DIAGNOSIS — Z6841 Body Mass Index (BMI) 40.0 and over, adult: Secondary | ICD-10-CM | POA: Insufficient documentation

## 2018-01-28 DIAGNOSIS — J449 Chronic obstructive pulmonary disease, unspecified: Secondary | ICD-10-CM | POA: Insufficient documentation

## 2018-01-28 DIAGNOSIS — E669 Obesity, unspecified: Secondary | ICD-10-CM | POA: Insufficient documentation

## 2018-01-28 DIAGNOSIS — N186 End stage renal disease: Secondary | ICD-10-CM

## 2018-01-28 DIAGNOSIS — T82858A Stenosis of vascular prosthetic devices, implants and grafts, initial encounter: Secondary | ICD-10-CM | POA: Insufficient documentation

## 2018-01-28 DIAGNOSIS — Z8249 Family history of ischemic heart disease and other diseases of the circulatory system: Secondary | ICD-10-CM | POA: Insufficient documentation

## 2018-01-28 DIAGNOSIS — Z7951 Long term (current) use of inhaled steroids: Secondary | ICD-10-CM | POA: Diagnosis not present

## 2018-01-28 DIAGNOSIS — Z79899 Other long term (current) drug therapy: Secondary | ICD-10-CM | POA: Insufficient documentation

## 2018-01-28 DIAGNOSIS — I509 Heart failure, unspecified: Secondary | ICD-10-CM | POA: Diagnosis not present

## 2018-01-28 DIAGNOSIS — Z7982 Long term (current) use of aspirin: Secondary | ICD-10-CM | POA: Insufficient documentation

## 2018-01-28 DIAGNOSIS — E1122 Type 2 diabetes mellitus with diabetic chronic kidney disease: Secondary | ICD-10-CM | POA: Diagnosis not present

## 2018-01-28 DIAGNOSIS — Z794 Long term (current) use of insulin: Secondary | ICD-10-CM | POA: Diagnosis not present

## 2018-01-28 DIAGNOSIS — I132 Hypertensive heart and chronic kidney disease with heart failure and with stage 5 chronic kidney disease, or end stage renal disease: Secondary | ICD-10-CM | POA: Diagnosis not present

## 2018-01-28 HISTORY — PX: A/V FISTULAGRAM: CATH118298

## 2018-01-28 LAB — GLUCOSE, CAPILLARY
GLUCOSE-CAPILLARY: 149 mg/dL — AB (ref 70–99)
Glucose-Capillary: 142 mg/dL — ABNORMAL HIGH (ref 70–99)

## 2018-01-28 LAB — POTASSIUM (ARMC VASCULAR LAB ONLY): POTASSIUM (ARMC VASCULAR LAB): 4.2 (ref 3.5–5.1)

## 2018-01-28 SURGERY — A/V FISTULAGRAM
Anesthesia: Moderate Sedation | Laterality: Left

## 2018-01-28 MED ORDER — MIDAZOLAM HCL 5 MG/5ML IJ SOLN
INTRAMUSCULAR | Status: AC
  Filled 2018-01-28: qty 5

## 2018-01-28 MED ORDER — SODIUM CHLORIDE 0.9 % IV SOLN
INTRAVENOUS | Status: DC
  Administered 2018-01-28: 09:00:00 via INTRAVENOUS

## 2018-01-28 MED ORDER — ONDANSETRON HCL 4 MG/2ML IJ SOLN: 4.0000 mg | Freq: Four times a day (QID) | INTRAMUSCULAR | Status: DC | PRN

## 2018-01-28 MED ORDER — HEPARIN SODIUM (PORCINE) 1000 UNIT/ML IJ SOLN
INTRAMUSCULAR | Status: DC | PRN
  Administered 2018-01-28: 3000 [IU] via INTRAVENOUS

## 2018-01-28 MED ORDER — CEFAZOLIN SODIUM-DEXTROSE 1-4 GM/50ML-% IV SOLN
1.0000 g | Freq: Once | INTRAVENOUS | Status: AC
  Administered 2018-01-28: 1 g via INTRAVENOUS

## 2018-01-28 MED ORDER — MIDAZOLAM HCL 2 MG/2ML IJ SOLN
INTRAMUSCULAR | Status: DC | PRN
  Administered 2018-01-28: 0.5 mg via INTRAVENOUS
  Administered 2018-01-28: 1 mg via INTRAVENOUS
  Administered 2018-01-28: 2 mg via INTRAVENOUS
  Administered 2018-01-28: 0.5 mg via INTRAVENOUS

## 2018-01-28 MED ORDER — HEPARIN (PORCINE) IN NACL 1000-0.9 UT/500ML-% IV SOLN
INTRAVENOUS | Status: AC
  Filled 2018-01-28: qty 1000

## 2018-01-28 MED ORDER — FENTANYL CITRATE (PF) 100 MCG/2ML IJ SOLN
INTRAMUSCULAR | Status: DC | PRN
  Administered 2018-01-28: 50 ug via INTRAVENOUS
  Administered 2018-01-28: 12.5 ug via INTRAVENOUS
  Administered 2018-01-28: 25 ug via INTRAVENOUS

## 2018-01-28 MED ORDER — HYDROMORPHONE HCL 1 MG/ML IJ SOLN: 1.0000 mg | Freq: Once | INTRAMUSCULAR | Status: DC | PRN

## 2018-01-28 MED ORDER — HEPARIN SODIUM (PORCINE) 1000 UNIT/ML IJ SOLN
INTRAMUSCULAR | Status: AC
  Filled 2018-01-28: qty 1

## 2018-01-28 MED ORDER — LIDOCAINE-EPINEPHRINE (PF) 1 %-1:200000 IJ SOLN
INTRAMUSCULAR | Status: AC
  Filled 2018-01-28: qty 10

## 2018-01-28 MED ORDER — FENTANYL CITRATE (PF) 100 MCG/2ML IJ SOLN
INTRAMUSCULAR | Status: AC
  Filled 2018-01-28: qty 2

## 2018-01-28 SURGICAL SUPPLY — 13 items
BALLN DORADO 10X20X80 (BALLOONS) ×3
BALLN DORADO 7X200X80 (BALLOONS) ×3
BALLN DORADO 8X100X80 (BALLOONS) ×3
BALLOON DORADO 10X20X80 (BALLOONS) IMPLANT
BALLOON DORADO 7X200X80 (BALLOONS) IMPLANT
BALLOON DORADO 8X100X80 (BALLOONS) IMPLANT
CANNULA 5F STIFF (CANNULA) ×2 IMPLANT
DEVICE PRESTO INFLATION (MISCELLANEOUS) ×2 IMPLANT
DRAPE BRACHIAL (DRAPES) ×2 IMPLANT
PACK ANGIOGRAPHY (CUSTOM PROCEDURE TRAY) ×3 IMPLANT
SHEATH BRITE TIP 6FRX5.5 (SHEATH) ×2 IMPLANT
SUT MNCRL AB 4-0 PS2 18 (SUTURE) ×2 IMPLANT
WIRE MAGIC TOR.035 180C (WIRE) ×2 IMPLANT

## 2018-01-28 NOTE — H&P (Signed)
[facility] History & Physical Update  The patient was interviewed and re-examined.  The patient's previous History and Physical has been reviewed and is unchanged.  There is no change in the plan of care. We plan to proceed with the scheduled procedure.  [name], MD  01/28/2018, 9:09 AM

## 2018-01-28 NOTE — Op Note (Signed)
[facility]    OPERATIVE NOTE   PROCEDURE: 1.   Left brachiocephalic arteriovenous fistula cannulation under ultrasound guidance 2.   Left arm fistulagram including central venogram 3.   Percutaneous transluminal angioplasty of the mid to distal upper arm cephalic vein with 7, 8, and 10 mm high pressure angioplasty balloons  PRE-OPERATIVE DIAGNOSIS: 1. ESRD 2. Poorly functional left brachiocephalic AVF  POST-OPERATIVE DIAGNOSIS: same as above   SURGEON: [name], MD  ANESTHESIA: local with MCS  ESTIMATED BLOOD LOSS: 5 cc  FINDING(S): 1. Irregular narrowing in several spots in close proximity in the mid and distal upper arm cephalic vein with greater than 70% stenosis.  The remainder of the cephalic vein to the cephalic vein subclavian vein confluence had no hemodynamically significant stenosis although there were 30 to 40% stenosis in the most proximal portion near the subclavian vein.  The remainder of the central venous circulation was widely patent.  SPECIMEN(S):  None  CONTRAST: 25 cc  FLUORO TIME: 2.1 minutes  MODERATE CONSCIOUS SEDATION TIME: Approximately 20 minutes with 4 mg of Versed and 87.5 mcg of Fentanyl   INDICATIONS: [name] is a 47 y.o. female who presents with malfunctioning left brachiocephalic arteriovenous fistula.  The patient is scheduled for left arm fistulagram.  The patient is aware the risks include but are not limited to: bleeding, infection, thrombosis of the cannulated access, and possible anaphylactic reaction to the contrast.  The patient is aware of the risks of the procedure and elects to proceed forward.  DESCRIPTION: After full informed written consent was obtained, the patient was brought back to the angiography suite and placed supine upon the angiography table.  The patient was connected to monitoring equipment. Moderate conscious sedation was administered with a face to face encounter with the patient  throughout the procedure with my supervision of the RN administering medicines and monitoring the patient's vital signs and mental status throughout from the start of the procedure until the patient was taken to the recovery room. The left arm was prepped and draped in the standard fashion for a percutaneous access intervention.  Under ultrasound guidance, the left brachiocephalic arteriovenous fistula was cannulated with a micropuncture needle under direct ultrasound guidance where it was patent and a permanent image was performed.  The microwire was advanced into the fistula and the needle was exchanged for the a microsheath.  I then upsized to a 6 Fr Sheath and imaging was performed.  Hand injections were completed to image the access including the central venous system. This demonstrated irregular narrowing in several spots in close proximity in the mid and distal upper arm cephalic vein with greater than 70% stenosis.  The remainder of the cephalic vein to the cephalic vein subclavian vein confluence had no hemodynamically significant stenosis although there were 30 to 40% stenosis in the most proximal portion near the subclavian vein.  The remainder of the central venous circulation was widely patent.  Based on the images, this patient will need intervention to these areas. I then gave the patient 3000 units of intravenous heparin.  I then crossed the stenosis with a Magic Tourqe wire.  Based on the imaging, a 7 mm x 20 cm high-pressure angioplasty balloon was selected.  The balloon was centered around the mid and distal upper arm cephalic vein stenosis and inflated to 16 ATM for 1 minute(s).  This was slightly undersized so I went with 10 cm length high-pressure angioplasty balloon and used 2 inflations  to treat the mid and distal upper arm cephalic vein.  These inflations were 16 to 20 atm for 1 minute.  Completion imaging showed about a 30% residual stenosis in the most distal upper arm cephalic vein and  about a 20% stenosis in the midportion but the most proximal area in the mid upper arm cephalic vein still had about a 60 to 70% residual stenosis.  In this focal location, a 10 mm diameter by 4 cm length high-pressure angioplasty balloon was inflated to 22 atm for 1 minute.  Completion imaging showed only about a 10 to 15% residual stenosis following this although there was some spasm more proximal to this.     Based on the completion imaging, no further intervention is necessary.  The wire and balloon were removed from the sheath.  A 4-0 Monocryl purse-string suture was sewn around the sheath.  The sheath was removed while tying down the suture.  A sterile bandage was applied to the puncture site.  COMPLICATIONS: None  CONDITION: Stable   [name]  01/28/2018 9:41 AM   This note was created with Dragon Medical transcription system. Any errors in dictation are purely unintentional.

## 2018-01-28 NOTE — Discharge Instructions (Signed)

## 2018-01-29 ENCOUNTER — Other Ambulatory Visit (HOSPITAL_COMMUNITY): Payer: Self-pay

## 2018-01-29 ENCOUNTER — Encounter (HOSPITAL_COMMUNITY): Payer: Self-pay

## 2018-01-29 NOTE — Progress Notes (Signed)
Bp: 138/90, pulse: 84, resp: 18, spo2: 97% todays weight: 280  Today visited with Abbagayle at dilysis due to her appts this week and she has been busy.  She has had her cataract surgery on left eye, went well.  She has not been following her diet plan, will continue to work with her on that.  She has been eating salty foods and high carbs.  We have talked about it in the past, will bring her diet plans and try to get her to journal her food intake.  Meds verified and she states has all her meds.  She is ambulating with no assistance.  She is on continuous oxygen at 3 lpm.  She states been feeling well lately.  Denies chest pain, shortness of breath at rest, dizziness or headaches.  She states she received her ssi and paying bills, her husband has went back to work.  Will visit next week at her home to educate on diet and heart failure.   [place] [phone]

## 2018-02-05 ENCOUNTER — Telehealth (INDEPENDENT_AMBULATORY_CARE_PROVIDER_SITE_OTHER): Payer: Self-pay

## 2018-02-05 NOTE — Telephone Encounter (Signed)
Patient called an stated that she is having increased pain in her Left arm where the fistula is. She would like to know what to do? Will she need an appointment and ultrasound?

## 2018-02-06 NOTE — Telephone Encounter (Signed)
Called the patient back to find out where the pain was in her arm and hand. She stated that it was totally in her hand and states that her hand was totally numb for about 2 days. She states that the feeling has come back, and that if her symptoms get worse that she will give [place] a call on Monday. She did not want to schedule anything today.

## 2018-02-10 ENCOUNTER — Other Ambulatory Visit (HOSPITAL_COMMUNITY): Payer: Self-pay

## 2018-02-10 ENCOUNTER — Encounter (HOSPITAL_COMMUNITY): Payer: Self-pay

## 2018-02-10 NOTE — Progress Notes (Signed)
Last weight: 280 lbs todays: 280  Bp: 152/77, pulse: 86, resp: 18, spo2: 97%  Today visited with [name] at dialysis.  She states been doing ok, she states she likes to eat.  She has been gaining weight but not fluid.  She states she has gained since summer approx 45 lbs.  Discussed diet and she states after the holidays she will get back on it.  She states watches her sodium but she was eating cheezits when I walked in.  She has no swelling in extremities.  She states she will be considered for kidney transplant if she can lose the 45 lbs.  She states she has been good except for cold.  She states been getting around, she is ambulating on her own, if shopping she uses wheel chair.  She denies chest pain, shortness of breath, headaches or dizziness.  She states has not been exercising or walking.  Discussed fluids, she watches that due to her dialysis.  Her sugars has been running good she states, was 89 this am.  Highest is 200 per [place]. Lungs are clear.  Denies abdominal tightness. Meds verified.  Will continue to visit and educate on diet.  Will try to get her to walk or light exercises.   [place] [phone]

## 2018-02-12 ENCOUNTER — Ambulatory Visit
Admission: RE | Admit: 2018-02-12 | Discharge: 2018-02-12 | Disposition: A | Discharge status: Home / Self Care | Payer: Medicare Other | Source: Ambulatory Visit | Attending: Nephrology | Admitting: Nephrology

## 2018-02-12 ENCOUNTER — Other Ambulatory Visit: Payer: Self-pay | Admitting: Nephrology

## 2018-02-12 DIAGNOSIS — R0602 Shortness of breath: Secondary | ICD-10-CM

## 2018-02-12 IMAGING — CR DG CHEST 2V
1 series · 2 of 2 positions shown · non-contrast
Comparison: [DATE]

CLINICAL DATA: Shortness of breath, cough

EXAM:
CHEST - 2 VIEW

[Series 1: dg chest 2 view · 0.14mm/px · 2 of 2 slices shown]
[im 1/2]
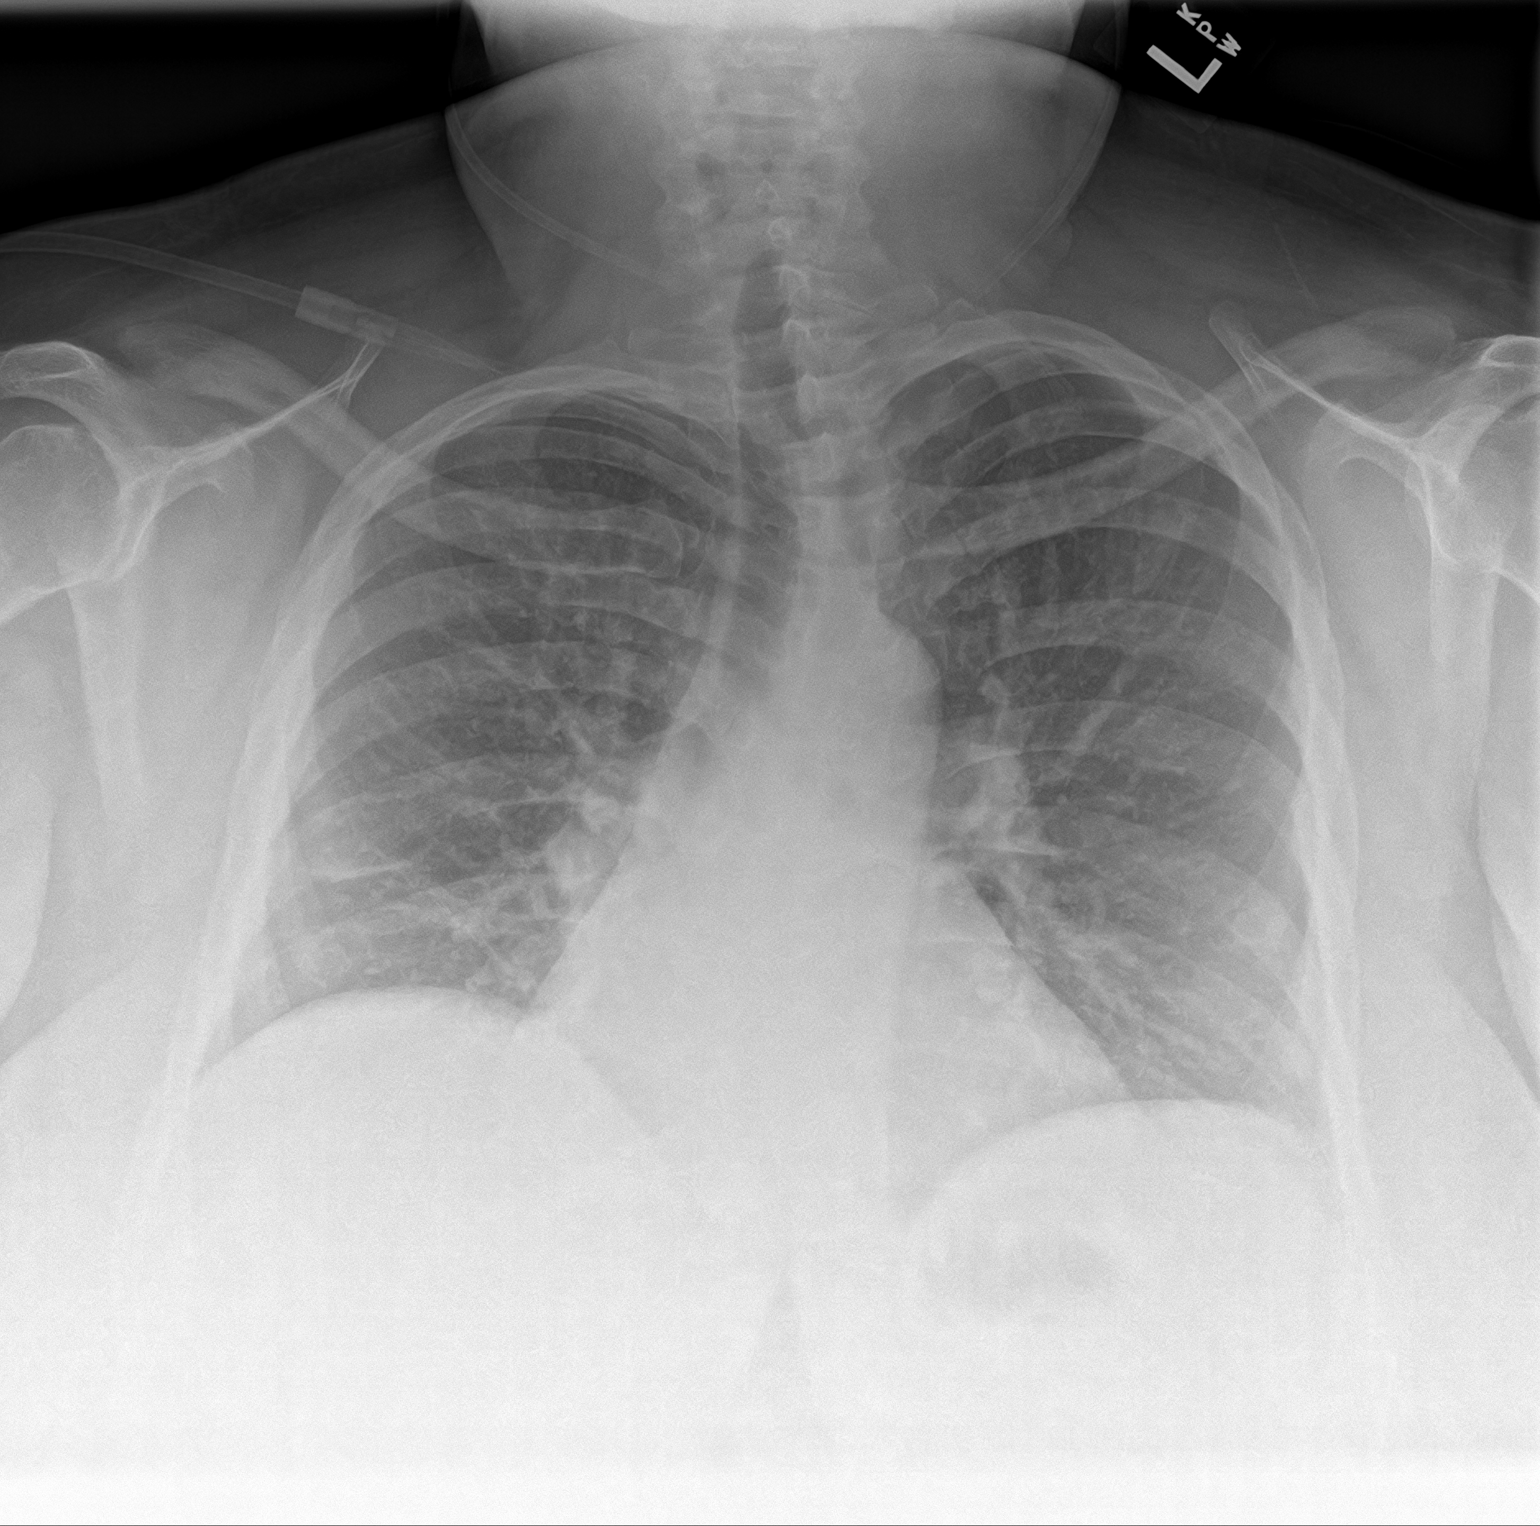
[im 2/2]
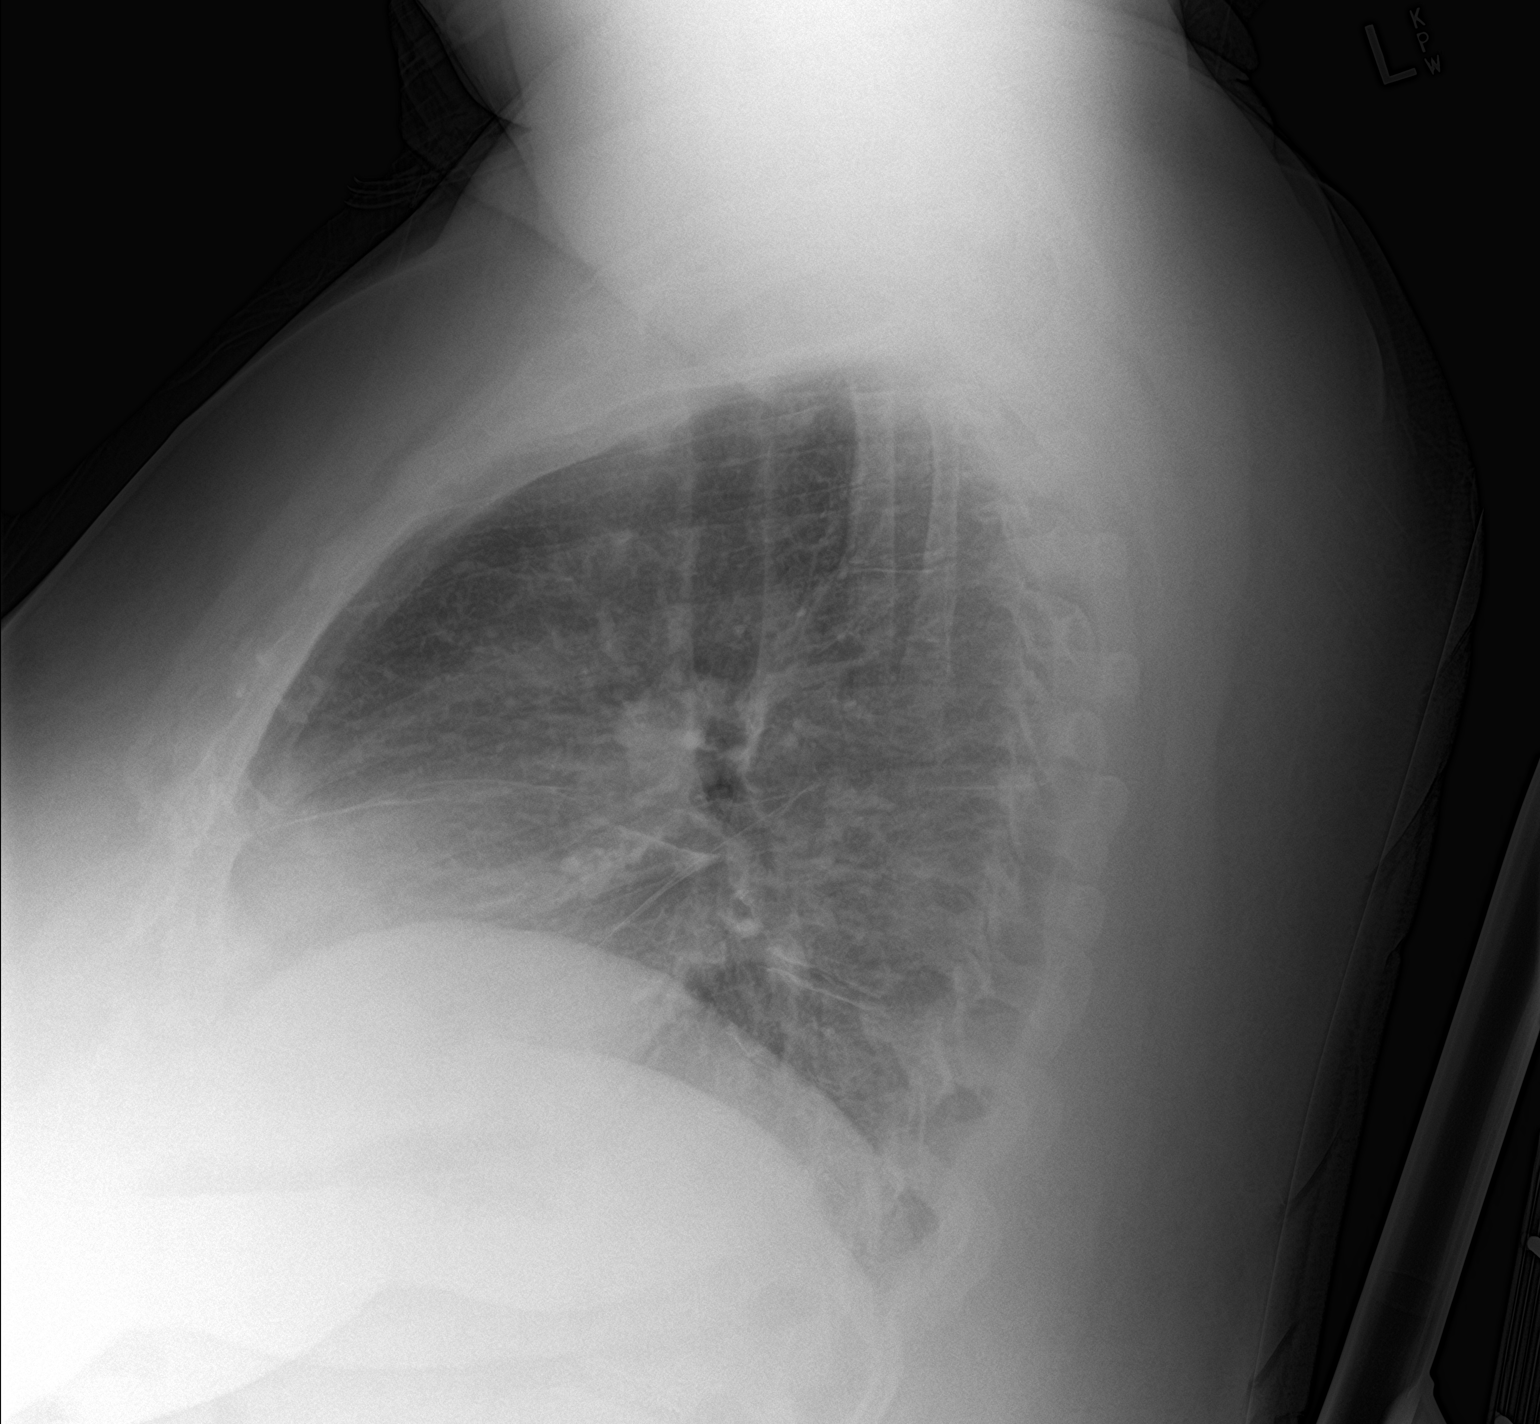

[2 of 2 positions shown; findings below may reference images not displayed]

FINDINGS: Normal heart size and vascularity. Low lung volumes with minor right
base atelectasis/scarring. No focal pneumonia, collapse or
consolidation. Negative for edema, large effusion or pneumothorax.
Trachea is midline.
IMPRESSION: Low volume exam with basilar atelectasis.

## 2018-03-03 ENCOUNTER — Other Ambulatory Visit (HOSPITAL_COMMUNITY): Payer: Self-pay

## 2018-03-03 ENCOUNTER — Encounter (HOSPITAL_COMMUNITY): Payer: Self-pay

## 2018-03-03 NOTE — Progress Notes (Signed)
Bp: 112/76, bp: 78, resp: 18, spo2: 97, has not been weighing  Prim states she has been doing pretty good.  Discussed diet, she states still gaining due to her weight at dialysis.  Asked her to start weighing.  She states needs to lose about 40 lbs to get a kidney transport, she has put on about 30 pounds in past few months.  She states she has been eating sweets and bad foods.  Since holidays are over she is starting to watch it.  Brought her a journal to write her daily food and water intake in.  She states she will use it and we will look at it each week. She denies chest pain, shortness of breath at rest, headaches or dizziness.  Lungs are clear.  No swelling in extremities.  Discussed simple exercises she could do on edge of bed or ina chair.  She states she is walking better and may join a gym to ride a bicycle.  Meds verified and she states she has them all.  Will continue to visit weekly to educate on diet for heart failure and diabetes.   [place] [phone]

## 2018-03-06 ENCOUNTER — Other Ambulatory Visit (INDEPENDENT_AMBULATORY_CARE_PROVIDER_SITE_OTHER): Payer: Self-pay | Admitting: Vascular Surgery

## 2018-03-06 DIAGNOSIS — Z9862 Peripheral vascular angioplasty status: Secondary | ICD-10-CM

## 2018-03-10 ENCOUNTER — Encounter (HOSPITAL_COMMUNITY): Payer: Self-pay

## 2018-03-10 ENCOUNTER — Other Ambulatory Visit (HOSPITAL_COMMUNITY): Payer: Self-pay

## 2018-03-10 NOTE — Progress Notes (Signed)
Bgl: 177 bp: 138/90, pulse: 86, resp: 18, spo2: 97%  She did not weight today.  Visited with her at dialysis, they could not do treatment today due to her port. She has a vascular appt tomorrow for them to look at it.  She states she has been doing well.  She wrote down everything in her food journal but left it at home.  Will visit at home next week.  She states she did not eat well but she wrote it down.  She had pancakes, cake and several other things.  She states been a little depressed, glad I came by, she states helps.  She appears to be ok now.  Her nephrologist was there and he kept telling her things are looking good, she wants a kidney transplant.  Will continue to work on diet.  She denies headaches, dizziness, chest pain or shortness of breath at rest.  She continues to wear her oxygen 24 hrs a day.  Meds verified, her husband takes care of her meds for her.  Lungs are clear, no edema in lower extremities.  Will continue to visit weekly.  [place] [phone]

## 2018-03-11 ENCOUNTER — Ambulatory Visit (INDEPENDENT_AMBULATORY_CARE_PROVIDER_SITE_OTHER): Payer: Medicare Other | Admitting: Vascular Surgery

## 2018-03-11 ENCOUNTER — Encounter (INDEPENDENT_AMBULATORY_CARE_PROVIDER_SITE_OTHER): Payer: Self-pay

## 2018-03-11 ENCOUNTER — Ambulatory Visit (INDEPENDENT_AMBULATORY_CARE_PROVIDER_SITE_OTHER): Payer: Medicare Other

## 2018-03-11 DIAGNOSIS — Z9862 Peripheral vascular angioplasty status: Secondary | ICD-10-CM | POA: Diagnosis not present

## 2018-03-23 ENCOUNTER — Telehealth: Payer: Self-pay | Admitting: Internal Medicine

## 2018-03-23 NOTE — Telephone Encounter (Signed)
Call made to patient, she received some paper work from her DME stating she needed a office visit and in office walk for her oxygen. Informed patient we would need to make an Appt. Patient voices understanding. Patient states she will call us back with appointment availability.

## 2018-03-24 NOTE — Telephone Encounter (Signed)
LMTCB to schedule apt with walk test-gave [place] phone number

## 2018-03-26 NOTE — Telephone Encounter (Signed)
Will route to [facility] triage for follow up.

## 2018-03-26 NOTE — Telephone Encounter (Signed)
LMTCB

## 2018-03-27 NOTE — Telephone Encounter (Signed)
Another attempt made to contact patient LMTCB

## 2018-03-30 ENCOUNTER — Telehealth: Payer: Self-pay | Admitting: Internal Medicine

## 2018-03-30 NOTE — Telephone Encounter (Signed)
Called patient to schedule re-cert appt. Scheduled.    It looks like Mrs Oswaldo Done just had payer change to Medicare so they can disregard the letter. It was for [facility] requirements. Since she is now Medicare she will need to be retested with a new sat under 88%, office visit showing chronic respiratory dx, symptoms, alternate methods and have it sent to the requalification team .  It will be like a new order unless she has been tested since 01/25/18 in office when she went Medicare effective

## 2018-03-30 NOTE — Telephone Encounter (Signed)
Per Baldemar Friday, RN [place]- ok to close encounter.  It has been taking care of.

## 2018-03-31 ENCOUNTER — Encounter (HOSPITAL_COMMUNITY): Payer: Self-pay

## 2018-03-31 ENCOUNTER — Other Ambulatory Visit (HOSPITAL_COMMUNITY): Payer: Self-pay

## 2018-03-31 NOTE — Progress Notes (Signed)
Today [name] states she is feeling ok, gets more tired easier since she has been gaining her weight back.  She states dialysis keeps upping her dry weight.  She has a nutritionist that is starting to work with her.  She needs to lose 50 lbs for kidney transplant.  She states she gets depressed some days and other days she is fine.  She does not want to take her depression pills.  Talked about diet and recipes.  She states has all her meds, she has not missed her dialysis appts.  She denies chest pain, headaches or dizziness.  Her PCP is going to get physical therapy back to work with her.  She still is walking without walker.  Will continue to visit to educate on diet and heart failure.   [place] [phone]

## 2018-04-03 ENCOUNTER — Encounter: Payer: Self-pay | Admitting: Internal Medicine

## 2018-04-03 ENCOUNTER — Ambulatory Visit (INDEPENDENT_AMBULATORY_CARE_PROVIDER_SITE_OTHER): Payer: Medicare Other | Admitting: Internal Medicine

## 2018-04-03 VITALS — BP 118/70 | HR 83 | Ht 64.5 in | Wt 300.2 lb

## 2018-04-03 DIAGNOSIS — J4551 Severe persistent asthma with (acute) exacerbation: Secondary | ICD-10-CM

## 2018-04-03 DIAGNOSIS — J984 Other disorders of lung: Secondary | ICD-10-CM | POA: Diagnosis not present

## 2018-04-03 DIAGNOSIS — J9611 Chronic respiratory failure with hypoxia: Secondary | ICD-10-CM | POA: Diagnosis not present

## 2018-04-03 MED ORDER — MOMETASONE FURO-FORMOTEROL FUM 200-5 MCG/ACT IN AERO: 2.0000 | INHALATION_SPRAY | Freq: Two times a day (BID) | RESPIRATORY_TRACT | 5 refills | Status: DC

## 2018-04-03 NOTE — Progress Notes (Signed)
[facility]    Synopsis: 48 year old female with morbid obesity, diastolic congestive heart failure, asthma/COPD with chronic hypoxic and hypercapnic respiratory failure, complicated by chronic kidney disease..   Assessment and Plan:  Asthma chronic bronchitis. -Had developed unusual sensations with Advair, Symbicort, but seem to tolerate Dulera.  Reiterated the importance of using Dulera daily. -Suspect an allergic component.   -Continue antihistamine once daily.  Dyspnea with chronic hypoxic respiratory failure. -I suspect a the major driver of her dyspnea is morbid obesity, restrictive lung disease related to obesity, and pulmonary restriction from cardiomegaly.  Obesity, possible OHS; BMI of 61 with likely restrictive lung disease.  --We tried to see if she could be started on Bipap but insurance not approved. In addition when she was tried in the hospital she did not tolerate it.   Deconditioning. -Likely contributing to dyspnea. -We referred the patient back to pulmonary rehabilitation.  Chronic diastolic congestive heart failure with cardiomegaly. -Continue to follow up with cardiology.  Chronic hypoxic respiratory failure. -Patient is currently on 3 L at rest, 4 L with hemodialysis.  We will renew her oxygen prescription today.  Return in about 6 months (around 10/02/2018).    Date: 04/03/2018  MRN# [id number] [name] [birth year]    [name] is a 48 y.o. old female seen in consultation for chief complaint of:    Chief Complaint  Patient presents with  . Follow-up    OV for oxygen recert. States she has good and bad days as far as her breathing. States she is currently coghing up green mucous. Recently completed Levaquin 3 days ago for URI.     HPI:   48 yo morbidly obese woman with uncontrolled diabetes with history of chronic diastolic CHF with numerous admission for CHF. She also has allergic asthma, morbid obesity with likely  OHS. She does not tolerate bipap and it was not covered by insurance.  Most recent hospitalizations>>September 2018, November 2018, January 2019, March 2019.   Since last visit she feels that her breathing had not been having any issues. She is taking proair as needed, she did not like symbicort, advair, because they made her chest feel funny. She was then on dulera which she did fine on, but she has not really been using it regularly because she forgets.  She recently had a URTI, and was given a course of levaquin and feels that she is getting better though back to normal.  She is currently on oxygen at 3L at rest and with activity, she turns up to 4l at HD.   She has been referred to pulmonary rehab in the past but but did not follow-up.  **Walk test 08/20/2017>> required 0 L at rest, 4 L with activity. **IgE 06/11/17>>249. **Echocardiogram 12/15/14>> EF equals 55%, mild left atrial dilation, small to moderate pericardial effusion, no TR noted. **Abs eosinophils count as high as 300 on 01/03/15.   **Review of outside allergy testing from 12/05/2011; positive allergies to multiple perennial aeroallergens including ragweed, dust mite, cockroach, cat hair, dog, Alternaria  Medication:   Reviewed.     Allergies:  Dust mite extract; Eggs or egg-derived products; Mold extract [trichophyton]; Pollen extract; Sulfa antibiotics; and Feraheme [ferumoxytol]  Review of Systems:  Constitutional: Feels well. Cardiovascular: Denies chest pain, exertional chest pain.  Pulmonary: Denies hemoptysis, pleuritic chest pain.   The remainder of systems were reviewed and were found to be negative other than what is documented in the HPI.    Physical  Examination:   VS: BP 118/70   Pulse 83   Ht 5' 4.5" (1.638 m)   Wt (!) 300 lb 3.2 oz (136.2 kg)   LMP 09/06/2016 (Approximate)   SpO2 97%   BMI 50.73 kg/m   General Appearance: No distress  Neuro:without focal findings, mental status, speech normal,  alert and oriented HEENT: PERRLA, EOM intact Pulmonary: No wheezing, No rales  CardiovascularNormal S1,S2.  No m/r/g.  Abdomen: Benign, Soft, non-tender, No masses Renal:  No costovertebral tenderness  GU:  No performed at this time. Endoc: No evident thyromegaly, no signs of acromegaly or Cushing features Skin:   warm, no rashes, no ecchymosis  Extremities: normal, no cyanosis, clubbing.    LABORATORY PANEL:   CBC No results for input(s): WBC, HGB, HCT, PLT in the last 168 hours. ------------------------------------------------------------------------------------------------------------------  Chemistries  No results for input(s): NA, K, CL, CO2, GLUCOSE, BUN, CREATININE, CALCIUM, MG, AST, ALT, ALKPHOS, BILITOT in the last 168 hours.  Invalid input(s): GFRCGP ------------------------------------------------------------------------------------------------------------------  Cardiac Enzymes No results for input(s): TROPONINI in the last 168 hours. ------------------------------------------------------------  RADIOLOGY:  No results found.     Thank  you for the consultation and for allowing [facility] to assist in the care of your patient. Our recommendations are noted above.  Please contact us if we can be of further service.  Marda Stalker, M.D., F.C.C.P.  Board Certified in Internal Medicine, Pulmonary Medicine, [place], and Sleep Medicine.  [facility] Office Number: [phone]   04/03/2018

## 2018-04-03 NOTE — Addendum Note (Signed)
Addended by: Vivia Ewing on: 04/03/2018 04:30 PM   Modules accepted: Orders

## 2018-04-03 NOTE — Patient Instructions (Addendum)
Recommend that you use dulera 2 puffs twice per day, rinse mouth after use.

## 2018-04-06 ENCOUNTER — Ambulatory Visit: Payer: Medicaid Other | Admitting: Family

## 2018-04-07 ENCOUNTER — Telehealth: Payer: Self-pay | Admitting: Internal Medicine

## 2018-04-07 ENCOUNTER — Encounter (HOSPITAL_COMMUNITY): Payer: Self-pay

## 2018-04-07 ENCOUNTER — Other Ambulatory Visit (HOSPITAL_COMMUNITY): Payer: Self-pay

## 2018-04-07 NOTE — Progress Notes (Signed)
Today [name] states been doing well.  She has been working with nutritionist at Dialysis, she states they gave her some recipes to try.  She told me some things she has ate the past week and they have been healthy.  She states she still not weighing at home.  She states her weight same at dialysis.  Verified her meds and she is aware of what she takes and for what.  A little of swelling in ankle area, has some sock lines.  She states been active, have been doing some exercises at home this week.  They are trying to get her physical therapy at home again to help.  Mood is good today.  Lungs are clear.  She denies chest pain, shortness of breath at rest, headaches or dizziness.  Her walk test done last week is getting better still need oxygen.  She has some goals including get a kidney transplant to come off dialysis and get off her oxygen.  Will keep visiting for heart failure and help with her goals.   [place] [phone]

## 2018-04-07 NOTE — Telephone Encounter (Signed)
Called and spoke to [place] with [facility].  Sharyn Lull has requested date that pt was re qualified for oxygen.  Per [facility] note- pt was walked during her office visit on 04/03/18. Sharyn Lull stated that she would fax test sheet to be completed and faxed back, as documentation that was given to her only says that the [facility] note was updated on 04/03/18 not actually preformed.  Will await form.

## 2018-04-07 NOTE — Telephone Encounter (Signed)
Oxygen saturation report has been received. I have completed form based off of 04/03/18 [facility] note and faxed it  back to [place] at [phone]. Nothing further is needed at this time

## 2018-04-08 NOTE — Telephone Encounter (Signed)
Message Closed Pt was seen 04/03/18 and re qualified.

## 2018-04-15 ENCOUNTER — Telehealth: Payer: Self-pay | Admitting: Internal Medicine

## 2018-04-16 ENCOUNTER — Other Ambulatory Visit: Payer: Self-pay | Admitting: *Deleted

## 2018-04-16 DIAGNOSIS — J449 Chronic obstructive pulmonary disease, unspecified: Secondary | ICD-10-CM

## 2018-04-16 NOTE — Telephone Encounter (Signed)
Pt wishes to change dme from [facility] to [place]. Orders will be placed.

## 2018-04-16 NOTE — Telephone Encounter (Signed)
  LMTCB: Pt on 3 liters 02 continuous DME: [facility] which does not carry POC at this time. Pt # [phone]

## 2018-04-17 ENCOUNTER — Telehealth: Payer: Self-pay | Admitting: Internal Medicine

## 2018-04-17 DIAGNOSIS — J439 Emphysema, unspecified: Secondary | ICD-10-CM

## 2018-04-17 NOTE — Telephone Encounter (Signed)
Order for nebulizer placed thru [place].

## 2018-04-30 ENCOUNTER — Other Ambulatory Visit (HOSPITAL_COMMUNITY): Payer: Self-pay

## 2018-04-30 NOTE — Progress Notes (Signed)
Visit today was at dialysis with [facility].  She states still gaining weight, she states she just can not quit eating the bad foods.  She has physical therapy coming out now.  She states she can tell she is gaining, getting more short of breath, tires more easily.   We talked about cravings, instead of eating all of it, maybe half and substituting with something healthy.  Talked about diet in general and maybe some foods that would help with cravings.  She has someone going to [employer] getting her fish sandwich for lunch.  Will keep trying different things for diet.  She states been feeling well, swelling is still down in extremities.  She makes all her dialysis appts.  Her bp has been dripping some at dialysis, she does not take her bp meds day of.  She has all her meds, meds were verified.  Will continue to visit for heart failure.  She missed her last HF clinic apt due to they cancelled, next week will reschedule it with her.   [place] [phone]

## 2018-05-13 ENCOUNTER — Encounter: Payer: Self-pay | Admitting: Obstetrics and Gynecology

## 2018-05-14 ENCOUNTER — Ambulatory Visit: Payer: Medicare Other | Admitting: Nurse Practitioner

## 2018-05-18 ENCOUNTER — Ambulatory Visit: Payer: Medicare Other | Admitting: Nurse Practitioner

## 2018-05-18 ENCOUNTER — Other Ambulatory Visit (HOSPITAL_COMMUNITY): Payer: Self-pay

## 2018-05-18 ENCOUNTER — Encounter (HOSPITAL_COMMUNITY): Payer: Self-pay

## 2018-05-18 NOTE — Progress Notes (Signed)
Today [place] states been doing ok.  She still not weighing, she states she does not want to because she knows she is gaining.  Approx 60 lbs per her in past 6 months.  She has no swelling in extremities, lungs are clear.  She states no shortness of breath at rest.  We have discussed diet but it is hard for her to manage.  She also has been talking to a nutritionist at Dialysis but no change in diet habits yet.  Did mention [name] to her and she states she thinks she would do them, quick and easy.  She could pack them on dialysis days.  Will sign her up for them.  Will continue to visit for Heart failure and help with diet.  Verified meds, she states she has them all.  She is concerned of running out of food in the house and house hold items since her husband is not working during this coronavirus, will try to find donations for them.    [place] [phone]

## 2018-05-20 ENCOUNTER — Telehealth (HOSPITAL_COMMUNITY): Payer: Self-pay | Admitting: Licensed Clinical Social Worker

## 2018-05-20 NOTE — Telephone Encounter (Signed)
Patient identified as a candidate to receive 14 heart healthy meals per week for 3 months through [facility] partnership with [name] program.   Completed referral sent in for review.  Anticipate patient will receive first shipment of food in 1-3 business days.  Jenniger Figiel H. Tilla Wilborn, LCSW Clinical Social Worker [facility] Desk#: [phone] Cell#: [phone]   

## 2018-05-28 ENCOUNTER — Telehealth (HOSPITAL_COMMUNITY): Payer: Self-pay | Admitting: Licensed Clinical Social Worker

## 2018-05-28 NOTE — Telephone Encounter (Signed)
CSW called pt to check in regarding initial [name] delivery.  Pt reports that she received the delivery with no issues and is enjoying the meals overall.    Pt reports that she has been struggling with having enough food for her and her family and this has helped take away some of that burden.  Pt aware of local food pantries and utilizes them when she needs to.  Pt has applied for foodstamps but has been informed she is over income limit.   No other concerns expressed at this time  CSW will continue to follow and assist as needed  [place], [facility] Desk#: [phone] Cell#: [phone]

## 2018-06-02 ENCOUNTER — Telehealth (HOSPITAL_COMMUNITY): Payer: Self-pay

## 2018-06-02 NOTE — Telephone Encounter (Signed)
Today was a phone visit with [name]  She states been feeling good.  Her weight has been staying the same.  No swelling in extremities.  She states has all her medications.  She has been eating her [name] meals.  She is familiar with ordering them.  Did advise her to put the fruit, dessert, etc with the appropriate meal to make a balanced meal.   She denies shortness of breath, chest pain, cough, dizziness or headaches.  She has been staying at home with the coronavirus pandemic.  She does some exercising.  She has been going to her dialysis appts and been going well.  Will continue to visit by phone at this time.  She is familiar with how to take her medications.   Regino Ramirez [phone]

## 2018-06-04 ENCOUNTER — Other Ambulatory Visit: Payer: Self-pay

## 2018-06-04 ENCOUNTER — Ambulatory Visit: Payer: Medicare Other | Attending: Anesthesiology | Admitting: Anesthesiology

## 2018-06-04 DIAGNOSIS — G8929 Other chronic pain: Secondary | ICD-10-CM

## 2018-06-04 DIAGNOSIS — M25561 Pain in right knee: Secondary | ICD-10-CM | POA: Diagnosis not present

## 2018-06-04 DIAGNOSIS — M25562 Pain in left knee: Secondary | ICD-10-CM

## 2018-06-04 DIAGNOSIS — M25552 Pain in left hip: Secondary | ICD-10-CM

## 2018-06-04 DIAGNOSIS — M25551 Pain in right hip: Secondary | ICD-10-CM | POA: Diagnosis not present

## 2018-06-04 DIAGNOSIS — G894 Chronic pain syndrome: Secondary | ICD-10-CM

## 2018-06-04 DIAGNOSIS — G609 Hereditary and idiopathic neuropathy, unspecified: Secondary | ICD-10-CM

## 2018-06-04 DIAGNOSIS — M818 Other osteoporosis without current pathological fracture: Secondary | ICD-10-CM

## 2018-06-04 DIAGNOSIS — M545 Low back pain: Secondary | ICD-10-CM | POA: Diagnosis not present

## 2018-06-08 ENCOUNTER — Telehealth: Payer: Self-pay | Admitting: Anesthesiology

## 2018-06-08 MED ORDER — TRAMADOL HCL 50 MG PO TABS: 50.0000 mg | ORAL_TABLET | Freq: Three times a day (TID) | ORAL | 0 refills | Status: DC

## 2018-06-08 MED ORDER — CYCLOBENZAPRINE HCL 10 MG PO TABS: 10.0000 mg | ORAL_TABLET | Freq: Every day | ORAL | 0 refills | Status: AC

## 2018-06-08 NOTE — Telephone Encounter (Signed)
Robin from [facility]. lvmail stating they could only fill patient script for tramadol for 7 days as she has not had opiods since 03-2017. Patient will need a script sent in to cover the rest of the month.

## 2018-06-08 NOTE — Progress Notes (Signed)
Virtual Visit via Telephone Note  I connected with [name] on 06/08/18 at 10:15 AM EDT by telephone and verified that I am speaking with the correct person using two identifiers.   I discussed the limitations, risks, security and privacy concerns of performing an evaluation and management service by telephone and the availability of in person appointments. I also discussed with the patient that there may be a patient responsible charge related to this service. The patient expressed understanding and agreed to proceed.   History of Present Illness: I spoke with [name] regarding her issues and chronic pain over the telephone today.  After identifying myself we spoke about her persistent low back pain.  She describes low back pain that is been present for several years with associated hip and knee pain.  She also describes diffuse joint pain affecting the ankles hips buttocks.  She states is gradually gotten worse over the past several months and she takes ibuprofen but this is of no help.  She also is a diabetic and undergoes hemodialysis with associated diabetic neuropathy and reports that unfortunately she cannot find any medication profile that gives her much relief.  She is gone through conservative therapy with stretching strengthening exercise activities without much relief.  Secondary to failing conservative therapy she has been referred to the pain clinic for assistance and other medication recommendations that might give her some relief.  The pain she describes is frequently severe in nature incapacitating at times and keeps her limited in her mobility.  She tries to do exercises but these seem to aggravate her pain she reports.  She has tried ibuprofen with limited success and has not been on any recent opioids.  She has been to see [facility] and I have reviewed her primary care notes.  She is also had previous x-rays showing reviews of her low back with no evidence of  anterolisthesis or retrolisthesis and normal vertebral body column height.  No further studies are available.    Observations/Objective:   Assessment and Plan: 1. Other osteoporosis without current pathological fracture   2. Chronic pain of both knees   3. Pain of both hip joints   4. Chronic bilateral low back pain without sciatica   5. Hereditary and idiopathic peripheral neuropathy   6. Chronic pain syndrome   Based on the above findings and per our discussion today I think it would be reasonable to start her on a baseline protocol of tramadol to assist with pain relief as she is failed with ibuprofen and more conservative therapy.  We will initiate therapy at tramadol 50 mg 1 p.o. 3 times daily for starters.  Furthermore I will start her on Flexeril 10 mg that she can use as 1/2 tablet or 1 full tablet once a day for muscle spasm of the low back.  Secondary to the covid  crisis we will schedule her for return appointment in 1 month.  She is instructed to contact the pain control center if she should have any problems in the meantime.   Follow Up Instructions:    I discussed the assessment and treatment plan with the patient. The patient was provided an opportunity to ask questions and all were answered. The patient agreed with the plan and demonstrated an understanding of the instructions.   The patient was advised to call back or seek an in-person evaluation if the symptoms worsen or if the condition fails to improve as anticipated.  I provided 30 minutes of non-face-to-face  time during this encounter.   Molli Barrows, MD

## 2018-06-08 NOTE — Telephone Encounter (Signed)
Patient states she had VV last week and Dr. Andree Elk was supposed to call in a script to her pharmacy. Pharmacy has no script there for her. Please let patient know status.

## 2018-06-08 NOTE — Telephone Encounter (Signed)
Spoke with patient to let her know that Dr Andree Elk is going to escribe Tramadol as well as a muscle relaxer for her.  She will check with her pharmacy prior to going for pick up to see if these medications require PA. If so, she will let us know so we can get that sent in for her.

## 2018-06-09 MED ORDER — TRAMADOL HCL 50 MG PO TABS: 50.0000 mg | ORAL_TABLET | Freq: Three times a day (TID) | ORAL | 0 refills | Status: AC

## 2018-06-09 NOTE — Addendum Note (Signed)
Addended by: Molli Barrows on: 06/09/2018 02:50 PM   Modules accepted: Orders

## 2018-06-15 ENCOUNTER — Telehealth (HOSPITAL_COMMUNITY): Payer: Self-pay

## 2018-06-15 NOTE — Telephone Encounter (Signed)
Telephone visit with [name] today.  She states been doing ok, she has been eating the [name] meals.  She has all her medications and meds were verified.  She has been staying in as much as possible due to virus.  She goes to her dialysis appts.  Denies chest pain, headaches, dizziness or shortness of breath at rest.  She continues to wear her oxygen.  She has a lump sum coming out of her check beginning of May that will only leave her 100$ for expenses.  She is getting in touch with social security to find out what the problem is.  States her swelling is good.  Her weight staying the same.  Will continue to visit by phone at this time, she is aware to call me if there is a problem, will visit is there is a need.   [place] [phone]

## 2018-07-02 ENCOUNTER — Other Ambulatory Visit (HOSPITAL_COMMUNITY): Payer: Self-pay

## 2018-07-02 NOTE — Progress Notes (Signed)
Today was a telephone visit.  [name] states been doing good.  She stays out of the public.  She is going to her dialysis appts.  She has all her medications, verified her meds.  She denies chest pain, headaches, shortness of breath at rest and dizziness.  She states she tries to stay active, does not exercise.  She is eating the [name] meals.  She tries to watch her diet and she watches her fluids.  She denies swelling in lower extremities.  She is not weighing, she weighs at dialysis, she states her weight is staying same right now.  Will continue to do telephone appts at this time.  She is aware to contact me if she has any problems.  Will do a home visit if there is a need.  Will continue to visit for heart failure and diet.   [place] [phone]

## 2018-07-27 ENCOUNTER — Telehealth (HOSPITAL_COMMUNITY): Payer: Self-pay

## 2018-07-27 NOTE — Telephone Encounter (Signed)
Had a telephone visit with [name] today.  She states she is preplanning for a trip to [place], [place].  She has her dialysis appts set up and she will be staying at a hotel away from a lot of people.  She is going for her nephew graduation and her birthday.  She states been doing well, no weight gain but no weight loss.  She has all her medications, meds were verified.  She states tries to watch sodium and fluids.  She still getting [name] meals.  She denies swelling.  She denies chest pain or shortness of breath at rest.  She is still wearing oxygen.  She states less activity due to staying home, she tries to stay active.  Will continue to visit for heart failure, medication compliance and diet.   [place] [phone]

## 2018-07-28 ENCOUNTER — Other Ambulatory Visit: Payer: Self-pay | Admitting: Anesthesiology

## 2018-08-04 ENCOUNTER — Other Ambulatory Visit (HOSPITAL_COMMUNITY): Payer: Self-pay

## 2018-08-17 ENCOUNTER — Telehealth (HOSPITAL_COMMUNITY): Payer: Self-pay

## 2018-08-17 NOTE — Telephone Encounter (Signed)
Today [name] contacted me back and advised she has been doing well.  She states has appt with vascular today about her dialysis access.  She states has all her medication, meds were verified.  She states trip went well, she did very good with her first trip out of town.  She kept her distance, she had her oxygen and did well.  She tries to watch her sodium and sugars but slips up a lot.  She eats her [name] meals but not all the time.  Her husband cooks a lot but they do eat fast food right much.  She states has plenty of household items at the moment.  She denies headaches, dizziness, shortness of breath or chest pain.  She states not doing any exercise right now.  She stays active in the house and going to appts.  She goes to all her dialysis appts.  Will continue to visit for heart failure, med compliance and diet.   [place] [phone]

## 2018-08-21 ENCOUNTER — Telehealth (HOSPITAL_COMMUNITY): Payer: Self-pay | Admitting: Licensed Clinical Social Worker

## 2018-08-21 NOTE — Telephone Encounter (Signed)
Pt enrolled in [name] Meals 3 month study through [facility]- patient's 3 month period has now ended so CSW spoke with pt to complete an exit interview regarding patient's experience:  1. Since being on [name] Meals have you noticed any changes in your health or the way you feel physically? no Weight loss? Actually gained weight but thinks this was due to inactivity during quarantine. Improvement in BP? unsure  2. What do you like about the program? Enjoyed the variety and ability to choose meals each week. What would you like to change/add? Thought the portions were too small.  3. How did you get food prior to [place]? a. Did you get food stamps? no b. Did someone help financially? yes c. Did you access food banks? Yes as often as she can d. Do you anticipate any issues transitioning back to getting your own food after this program ends? Always somewhat concerned about getting enough food but she understands her local resources.  4. Would you be interested in future food pilots? yes  5. Would you like a dietary consult? yes  6. Do you have internet access? Tablet/Smart Phone? Smart phone   Jorge Ny, LCSW Clinical Social Worker [facility] Desk#: [phone] Cell#: [phone]

## 2018-08-26 NOTE — Progress Notes (Signed)
Had a visit with [name] and she is concerned and excited talking about upcoming trip.  She states she has scheduled it around dialysis appts.  She has contacted oxygen company in [facility] to have a concentrator while she is there.  She has all her medications and has been feeling good.  She will be traveling by car.  This will be first trip in several years.  She denies chest pain, shortness of breath at rest, headaches or dizziness.  She is still trying to watch diet but slips a lot.  She denies swelling in extremities.  She states they have the trip planned out, expecially due to virus right now.  They will stop often to stretch.  They will be with family and not going out into the public.  She is aware of high sodium foods and how much fluid to be intaking.  Meds verified.  Will continue to visit for heart failure.   [place] [phone]

## 2018-08-31 ENCOUNTER — Other Ambulatory Visit (HOSPITAL_COMMUNITY): Payer: Self-pay

## 2018-08-31 NOTE — Progress Notes (Signed)
Today was a phone visit with [name]  She states she has been feeling good.  She goes to her dialysis appts.  She has been staying inside in the cool with this hot weather.  She avoids crowds with the pandemic.  She states her weight keeps going up, Kidney doctor is sending her to the Bariatric clinic to speak with them.  She has all her medications, verified them.  She denies chest pain, shortness of breath at rest(wears her oxygen), dizziness or headaches.  She states has no swelling in lower extremities.  She states has everything she needs.  Will continue to visit for heart failure, medication compliance and diet.   [place] [phone]

## 2018-09-09 ENCOUNTER — Encounter (INDEPENDENT_AMBULATORY_CARE_PROVIDER_SITE_OTHER): Payer: Medicare Other

## 2018-09-09 ENCOUNTER — Ambulatory Visit (INDEPENDENT_AMBULATORY_CARE_PROVIDER_SITE_OTHER): Payer: Medicare Other | Admitting: Nurse Practitioner

## 2018-10-07 ENCOUNTER — Telehealth (HOSPITAL_COMMUNITY): Payer: Self-pay

## 2018-10-07 NOTE — Telephone Encounter (Signed)
Today was a telephone visit.  [name] states her a1c is high and she is going to have to work on that.  Diet is bad, she can not control it.  She has a nutritionist at dialysis that is trying to help.  Gaining weight, last weight was 303 lbs.  She is wanting a kidney transplant but needs to lose weight first.  She has all her medications, meds were verified.  She denies swelling in extremities or abdomen feeling tight.  She states constipated today but she has took some meds to help.  She denies chest pain, shortness of breath at rest, headaches or dizziness.  She still wearing oxygen 24 hours a day.  She has been to all her dialysis appts.  She is aware of up coming appts. Discussed diet and healthy alternatives.  Discussed will power is up to her.  Will continue to visit for heart failure.   [place] [phone]

## 2018-10-13 ENCOUNTER — Other Ambulatory Visit: Payer: Self-pay | Admitting: Anesthesiology

## 2018-10-17 ENCOUNTER — Other Ambulatory Visit: Payer: Self-pay | Admitting: Anesthesiology

## 2018-11-05 ENCOUNTER — Other Ambulatory Visit (HOSPITAL_COMMUNITY): Payer: Self-pay

## 2018-11-05 NOTE — Progress Notes (Signed)
Today had a visit with [name] She had dialysis today, she is tired.  She states doing ok, no change.  She still trying to lose weight without success.  She has all her meds and verified them.  She states has allergies or a cold, no fever.  Dialysis advised her to take mucinex, if no better they will send her for a chest x ray.  She has no swelling, denies chest pain, shortness of breath at rest, dizziness or headaches.  Will continue to visit for heart failure.   [place] [phone]

## 2018-11-16 ENCOUNTER — Telehealth (HOSPITAL_COMMUNITY): Payer: Self-pay

## 2018-11-16 NOTE — Telephone Encounter (Signed)
Today had a telephone visit with [name]  She states she has been good.  She still having hard time dieting.  She has been going to dialysis and has seen their nutritionist. She is hoping for a kidney transplant.  She states has all her medications and verified them.  Explained that she has been doing well with her heart failure and we will discharge her from the program.   She can always call if she has problems and we can add her back to the program.  [name] is aware and if she has readmittance to hospital will add her back.  She appears to understand and states she will keep my number.  Since started the program she has controlled her heart failure, medications are affordable, we have attempted to get her to watch certain foods, have assisted with certain house hold needs, she is now on disability and able to support her self and family better. Medication are affordable.    [place] [phone]

## 2019-02-16 ENCOUNTER — Telehealth: Payer: Self-pay | Admitting: Pulmonary Disease

## 2019-02-16 NOTE — Telephone Encounter (Signed)
Former DR pt- pt is scheduled with [facility] and stated that she typically has PFT yearly prior to follow up. I do not see PFT mentioned in last OV note.   LG, please advise. Thanks

## 2019-02-23 NOTE — Telephone Encounter (Signed)
Per Dr. Patsey Berthold verbally- due to covid, PFT schedule is out until 03/2019. She would like to eval pt first and discuss PFT at upcoming [place].  Pt is aware of this information and voiced her understanding. Nothing further is needed.

## 2019-03-19 ENCOUNTER — Ambulatory Visit: Payer: Medicare Other | Admitting: Pulmonary Disease

## 2019-03-29 NOTE — Progress Notes (Deleted)
@Patient  ID: [name], female    DOB: 04/11/1970, 49 y.o.   MRN: [id number]  No chief complaint on file.   Referring provider: [name], NP  HPI: 49 year old female, never smoked. PMH significant for COPD/asthma, obesity hypoventilation syndrome (not on BIPAP), chronic diastolic HF, HTN, type 2 diabetes, ESRD (on HD), anxiety/depression, chronic pain syndrome. Former patient of Dr. Ashby Dawes, last seen on 04/03/18. Maintained on Dulera and chronic oxygen 3-4L. Receives annual PFTs, FU in 6 months. Patient will be establishing care with Dr. Patsey Berthold.     Allergies  Allergen Reactions  . Dust Mite Extract Shortness Of Breath and Itching  . Eggs Or Egg-Derived Products Itching    MAKES TONGUE ITCH but she will eat, only in small amounts  . Mold Extract [Trichophyton] Shortness Of Breath and Itching  . Pollen Extract Shortness Of Breath    Itching, shortness of breath, asthma like symptoms  . Sulfa Antibiotics Itching  . Feraheme [Ferumoxytol] Itching    Uses benadryl so she can get the medicine    Immunization History  Administered Date(s) Administered  . Influenza,inj,Quad PF,6+ Mos 11/14/2016  . PPD Test 04/15/2017    Past Medical History:  Diagnosis Date  . (HFpEF) heart failure with preserved ejection fraction ([place])    a. 11/2014 Echo: EF 50-55%, Gr1 DD; b. 06/2016 Echo: EF 60-65%, no rwma, mod dil LA, nl RV, triv eff.  . Anxiety   . Asthma   . Bronchitis   . CHF (congestive heart failure) ([place])   . Chronic cough   . CKD (chronic kidney disease), stage III ([place])   . COPD (chronic obstructive pulmonary disease) ([place])    a. Home O2.  (2-4L)  . Depression   . Diabetes mellitus without complication ([place])   . Diverticulosis   . Dyspnea   . Dysrhythmia    per pt, no arrythmias  . Environmental and seasonal allergies   . Hypertension   . Iron deficiency anemia    a. chronic blood loss->heavy menses.  . Kidney failure    dialysis tues/thur/sat   ...  left arm shunt  . Morbid obesity ([place])   . Orthopnea   . Oxygen dependent    3l/min  . Pericardial effusion    a. 11/2014 CT Chest: Mod effusion; b. 11/2014 Echo: small to mod circumferential effusion. No hemodynamic compromise.  . Pneumonia    history  . Poorly controlled diabetes mellitus ([place])    a. 11/2014 A1c 13.2.  [name] Swelling of both lower extremities     Tobacco History: Social History   Tobacco Use  Smoking Status Never Smoker  Smokeless Tobacco Never Used   Counseling given: Not Answered   Outpatient Medications Prior to Visit  Medication Sig Dispense Refill  . acetaminophen (TYLENOL) 500 MG tablet Take 1,000 mg by mouth every 6 (six) hours as needed for mild pain, fever or headache.    . albuterol (PROVENTIL HFA;VENTOLIN HFA) 108 (90 Base) MCG/ACT inhaler Inhale 2 puffs into the lungs every 6 (six) hours as needed for wheezing or shortness of breath. (Patient taking differently: Inhale 2 puffs into the lungs every 4 (four) hours as needed for wheezing or shortness of breath. ) 1 Inhaler 3  . albuterol (PROVENTIL) (2.5 MG/3ML) 0.083% nebulizer solution Take 2.5 mg by nebulization every 6 (six) hours as needed for wheezing or shortness of breath.    [name] aspirin EC 81 MG tablet Take 81 mg by mouth daily.     [name]  atorvastatin (LIPITOR) 80 MG tablet Take 80 mg by mouth daily at 6 PM.    . b complex-vitamin c-folic acid (NEPHRO-VITE) 0.8 MG TABS tablet Take 1 tablet by mouth at bedtime.    . budesonide-formoterol (SYMBICORT) 160-4.5 MCG/ACT inhaler Inhale 2 puffs into the lungs 2 (two) times daily.    . Cholecalciferol (HM VITAMIN D3) 4000 units CAPS Take 1 capsule by mouth once a week.    . cloNIDine (CATAPRES) 0.2 MG tablet Take 1 tablet (0.2 mg total) by mouth 2 (two) times daily. (Patient taking differently: Take 0.2 mg by mouth 2 (two) times daily. ) 30 tablet 0  . cyclobenzaprine (FLEXERIL) 10 MG tablet Take 10 mg by mouth once.    . docusate sodium (COLACE) 100 MG capsule  Take 100 mg by mouth daily.    . ferric citrate (AURYXIA) 1 GM 210 MG(Fe) tablet Take 210 mg by mouth 3 (three) times daily with meals.    [name] guaiFENesin-dextromethorphan (ROBITUSSIN DM) 100-10 MG/5ML syrup Take 5 mLs every 4 (four) hours as needed by mouth for cough. 118 mL 0  . insulin aspart (NOVOLOG) 100 UNIT/ML injection Inject 0-5 Units into the skin at bedtime. 10 mL 11  . insulin aspart (NOVOLOG) 100 UNIT/ML injection Inject 0-15 Units into the skin 3 (three) times daily with meals. 10 mL 11  . insulin degludec (TRESIBA) 100 UNIT/ML SOPN FlexTouch Pen Inject 36 Units into the skin daily at 10 pm.     . ipratropium-albuterol (DUONEB) 0.5-2.5 (3) MG/3ML SOLN Take 3 mLs by nebulization every 6 (six) hours as needed. 360 mL 5  . lactulose (CHRONULAC) 10 GM/15ML solution Take 30 mLs (20 g total) by mouth daily as needed for severe constipation. 240 mL 0  . lidocaine-prilocaine (EMLA) cream Apply topically as needed (For access for dialysis).    [name] losartan (COZAAR) 50 MG tablet Take 50 mg by mouth 2 (two) times daily. On dialysis days, patient is taking in the evening    . metoprolol tartrate (LOPRESSOR) 100 MG tablet Take 1 tablet (100 mg total) by mouth 2 (two) times daily. 60 tablet 0  . mometasone-formoterol (DULERA) 200-5 MCG/ACT AERO Inhale 2 puffs into the lungs 2 (two) times daily. Rinse mouth after use. 1 Inhaler 5  . OXYGEN Inhale 2-4 L into the lungs.    . polyethylene glycol (MIRALAX / GLYCOLAX) packet Take 17 g by mouth daily as needed for mild constipation. 14 each 0  . torsemide (DEMADEX) 100 MG tablet Take 1 tablet (100 mg total) by mouth 2 (two) times daily. 180 tablet 0  . traMADol (ULTRAM) 50 MG tablet Take by mouth every 6 (six) hours as needed.     No facility-administered medications prior to visit.      Review of Systems  Review of Systems   Physical Exam  LMP 09/06/2016 (Approximate)  Physical Exam   Lab Results:  CBC    Component Value Date/Time   WBC  12.4 (H) 04/24/2017 1215   RBC 3.07 (L) 04/24/2017 1215   HGB 11.6 (L) 01/21/2018 0901   HGB 12.4 01/11/2014 1324   HCT 34.0 (L) 01/21/2018 0901   HCT 39.2 01/11/2014 1324   PLT 338 04/24/2017 1215   PLT 370 01/11/2014 1324   MCV 86.3 04/24/2017 1215   MCV 86 01/11/2014 1324   MCH 26.0 04/24/2017 1215   MCHC 30.1 (L) 04/24/2017 1215   RDW 16.8 (H) 04/24/2017 1215   RDW 15.1 (H) 01/11/2014 1324   LYMPHSABS 0.8 (  L) 03/03/2017 1114   LYMPHSABS 1.8 01/11/2014 1324   MONOABS 0.7 03/03/2017 1114   MONOABS 0.5 01/11/2014 1324   EOSABS 0.1 03/03/2017 1114   EOSABS 0.2 01/11/2014 1324   BASOSABS 0.1 03/03/2017 1114   BASOSABS 0.1 01/11/2014 1324    BMET    Component Value Date/Time   NA 135 01/21/2018 0901   NA 135 (L) 11/21/2013 1153   K 4.3 01/21/2018 0901   K 4.1 11/21/2013 1153   CL 95 (L) 04/26/2017 0609   CL 98 11/21/2013 1153   CO2 26 04/26/2017 0609   CO2 30 11/21/2013 1153   GLUCOSE 82 01/21/2018 0901   GLUCOSE 287 (H) 11/21/2013 1153   BUN 46 (H) 04/26/2017 0609   BUN 16 11/21/2013 1153   CREATININE 3.97 (H) 04/26/2017 0609   CREATININE 0.85 11/21/2013 1153   CALCIUM 8.7 (L) 04/26/2017 0609   CALCIUM 8.7 11/21/2013 1153   GFRNONAA 13 (L) 04/26/2017 0609   GFRNONAA >60 11/21/2013 1153   GFRAA 15 (L) 04/26/2017 0609   GFRAA >60 11/21/2013 1153    BNP    Component Value Date/Time   BNP 577.0 (H) 03/27/2017 0702    ProBNP No results found for: PROBNP  Imaging: No results found.   Assessment & Plan:   No problem-specific Assessment & Plan notes found for this encounter.     Martyn Ehrich, NP 03/29/2019

## 2019-03-30 ENCOUNTER — Telehealth: Payer: Self-pay | Admitting: Primary Care

## 2019-03-30 NOTE — Telephone Encounter (Signed)
Received results from [facility] and pt tested positive for COVID 19 on 03/08/2019.

## 2019-03-30 NOTE — Telephone Encounter (Signed)
Patient orignally tested positive for covid on 1/6 or 1/9 (she was unsure of the date). Patient states she feels better but is still fatigued. She retested on 03/25/19 and was still positive. She would prefer to come in the office but I made patient aware that since she is still testing positive we can set up a phone/televisit with her. She mentioned she was told that she could test positive for 3 months. Please advise.

## 2019-03-30 NOTE — Telephone Encounter (Signed)
If she is still having acute symptoms needs televisit. If no fever, chills or cough she can come in for visit. It has been 21 days since first positive.

## 2019-03-30 NOTE — Telephone Encounter (Signed)
Went over covid screening w/ pt and she said that she came in contact w/ someone w/ covid on 03/03/19 and she is positive for covid but was told that a negative test can take months to get. No new symptoms, no travel, but she feels she needs to come in due to having to have her oxygen checked per [employer]. She said her phone is almost dead so it's okay to leave a VM.

## 2019-03-30 NOTE — Telephone Encounter (Signed)
LVMTCBx1 need to confirm that pt is not having any acute symptoms. If she is then we will need to schedule a televist and if she is not then we can schedule her in office.

## 2019-03-30 NOTE — Telephone Encounter (Signed)
Called patient to verify where she had covid test done and she stated it was [facility]. I called them to see if they could fax over covid results.

## 2019-03-31 ENCOUNTER — Ambulatory Visit: Payer: Medicare Other | Admitting: Primary Care

## 2019-03-31 NOTE — Telephone Encounter (Signed)
Spoke with pt and she stated she is not feeling well and does not have any transportation. She rescheduled for 2/22 with NP Eric Form.

## 2019-04-19 ENCOUNTER — Other Ambulatory Visit: Payer: Self-pay

## 2019-04-19 ENCOUNTER — Ambulatory Visit (INDEPENDENT_AMBULATORY_CARE_PROVIDER_SITE_OTHER): Payer: Medicare Other | Admitting: Acute Care

## 2019-04-19 ENCOUNTER — Encounter: Payer: Self-pay | Admitting: Acute Care

## 2019-04-19 VITALS — BP 138/74 | HR 88 | Temp 98.0°F | Ht 64.5 in | Wt 304.0 lb

## 2019-04-19 DIAGNOSIS — E669 Obesity, unspecified: Secondary | ICD-10-CM

## 2019-04-19 DIAGNOSIS — J9610 Chronic respiratory failure, unspecified whether with hypoxia or hypercapnia: Secondary | ICD-10-CM | POA: Diagnosis not present

## 2019-04-19 DIAGNOSIS — J45909 Unspecified asthma, uncomplicated: Secondary | ICD-10-CM | POA: Diagnosis not present

## 2019-04-19 DIAGNOSIS — I5032 Chronic diastolic (congestive) heart failure: Secondary | ICD-10-CM

## 2019-04-19 DIAGNOSIS — J449 Chronic obstructive pulmonary disease, unspecified: Secondary | ICD-10-CM | POA: Diagnosis not present

## 2019-04-19 DIAGNOSIS — U071 COVID-19: Secondary | ICD-10-CM

## 2019-04-19 DIAGNOSIS — Z9189 Other specified personal risk factors, not elsewhere classified: Secondary | ICD-10-CM

## 2019-04-19 MED ORDER — MOMETASONE FURO-FORMOTEROL FUM 200-5 MCG/ACT IN AERO: 2.0000 | INHALATION_SPRAY | Freq: Two times a day (BID) | RESPIRATORY_TRACT | Status: DC

## 2019-04-19 NOTE — Progress Notes (Signed)
History of Present Illness [name] is a 49 y.o. female never smoker with morbid obesity, diastolic congestive heart failure, asthma/COPD with chronic hypoxic and hypercapnic respiratory failure, complicated by chronic kidney disease.Marland KitchenShe was  followed by [name] She will now be followed by [name]   04/19/2019 Follow up for asthma.  Pt. Presents for follow up. She was diagnosed with COVID 02/2019. She has not been using her [facility] for her asthma. . She has been out of medication for at least 3 months. She is not using her daily allergy medication. She has been using her breathing treatments once daily. She usually uses them 1-2 times a week. She is recovering from COVID>>  Jan 2021. She did not need hospitalization. She states she used her nebs and this helped her get through it. She has been wearing her oxygen at 3 L [place]  24/7. She also needs to re- qualify for her oxygen today per her DME.. She states she does not feel she is quite back to her baseline compared to  prior to her COVID diagnosis. She states she has been wheezing. She notes it is worse at night and with weather changes. She endorses thick secretions that are clear to yellow tinged. She has no fever. She was not treated with any COVID specific medications for her COVID diagnosis. She did Vitamin D 3, Zinc, Elderberry, Green Tea, and DuoNebs. She recovered 2 weeks after her diagnosis. She still feels fatigued. She continues to have some body aches. She denies any fever, chest pain, orthopnea or hemoptysis She states she did have CXR but it was done at an outside facility. We do not have access to those records. She is now asymptomatic.  She is deconditioned and here today in a wheelchair.  Test Results: Oxygen Qualification Desaturated to 86% on RA with minimal ambulation  CBC Latest Ref Rng & Units 01/21/2018 04/24/2017 04/21/2017  WBC 3.6 - 11.0 K/uL - 12.4(H) 8.3  Hemoglobin 12.0 - 15.0 g/dL 11.6(L) 8.0(L)  8.0(L)  Hematocrit 36.0 - 46.0 % 34.0(L) 26.5(L) 26.0(L)  Platelets 150 - 440 K/uL - 338 321    BMP Latest Ref Rng & Units 01/21/2018 04/26/2017 04/21/2017  Glucose 70 - 99 mg/dL 82 266(H) 188(H)  BUN 6 - 20 mg/dL - 46(H) 56(H)  Creatinine 0.44 - 1.00 mg/dL - 3.97(H) 3.93(H)  Sodium 135 - 145 mmol/L 135 131(L) 130(L)  Potassium 3.5 - 5.1 mmol/L 4.3 5.0 4.4  Chloride 101 - 111 mmol/L - 95(L) 90(L)  CO2 22 - 32 mmol/L - 26 30  Calcium 8.9 - 10.3 mg/dL - 8.7(L) 8.5(L)    BNP    Component Value Date/Time   BNP 577.0 (H) 03/27/2017 0702    ProBNP No results found for: PROBNP  PFT No results found for: FEV1PRE, FEV1POST, FVCPRE, FVCPOST, TLC, DLCOUNC, PREFEV1FVCRT, PSTFEV1FVCRT  No results found.   Past medical hx Past Medical History:  Diagnosis Date  . (HFpEF) heart failure with preserved ejection fraction ([place])    a. 11/2014 Echo: EF 50-55%, Gr1 DD; b. 06/2016 Echo: EF 60-65%, no rwma, mod dil LA, nl RV, triv eff.  . Anxiety   . Asthma   . Bronchitis   . CHF (congestive heart failure) ([place])   . Chronic cough   . CKD (chronic kidney disease), stage III   . COPD (chronic obstructive pulmonary disease) ([place])    a. Home O2.  (2-4L)  . Depression   . Diabetes mellitus without complication ([place])   .  Diverticulosis   . Dyspnea   . Dysrhythmia    per pt, no arrythmias  . Environmental and seasonal allergies   . Hypertension   . Iron deficiency anemia    a. chronic blood loss->heavy menses.  . Kidney failure    dialysis tues/thur/sat   ... left arm shunt  . Morbid obesity ([place])   . Orthopnea   . Oxygen dependent    3l/min  . Pericardial effusion    a. 11/2014 CT Chest: Mod effusion; b. 11/2014 Echo: small to mod circumferential effusion. No hemodynamic compromise.  . Pneumonia    history  . Poorly controlled diabetes mellitus ([place])    a. 11/2014 A1c 13.2.  [name] Swelling of both lower extremities      Social History   Tobacco Use  . Smoking status: Never Smoker  .  Smokeless tobacco: Never Used  Substance Use Topics  . Alcohol use: Yes    Alcohol/week: 0.0 standard drinks    Comment: occasional  . Drug use: Yes    Types: Marijuana    JenniferWeaver reports that she has never smoked. She has never used smokeless tobacco. She reports current alcohol use. She reports current drug use. Drug: Marijuana.  Tobacco Cessation: Never smoker  Past surgical hx, Family hx, Social hx all reviewed.  Current Outpatient Medications on File Prior to Visit  Medication Sig  . acetaminophen (TYLENOL) 500 MG tablet Take 1,000 mg by mouth every 6 (six) hours as needed for mild pain, fever or headache.  . albuterol (PROVENTIL HFA;VENTOLIN HFA) 108 (90 Base) MCG/ACT inhaler Inhale 2 puffs into the lungs every 6 (six) hours as needed for wheezing or shortness of breath. (Patient taking differently: Inhale 2 puffs into the lungs every 4 (four) hours as needed for wheezing or shortness of breath. )  . albuterol (PROVENTIL) (2.5 MG/3ML) 0.083% nebulizer solution Take 2.5 mg by nebulization every 6 (six) hours as needed for wheezing or shortness of breath.  [name] aspirin EC 81 MG tablet Take 81 mg by mouth daily.   [name] atorvastatin (LIPITOR) 80 MG tablet Take 80 mg by mouth daily at 6 PM.  . b complex-vitamin c-folic acid (NEPHRO-VITE) 0.8 MG TABS tablet Take 1 tablet by mouth at bedtime.  . Cholecalciferol (HM VITAMIN D3) 4000 units CAPS Take 1 capsule by mouth once a week.  . cloNIDine (CATAPRES) 0.2 MG tablet Take 1 tablet (0.2 mg total) by mouth 2 (two) times daily. (Patient taking differently: Take 0.2 mg by mouth 2 (two) times daily. )  . cyclobenzaprine (FLEXERIL) 10 MG tablet Take 10 mg by mouth once.  . docusate sodium (COLACE) 100 MG capsule Take 100 mg by mouth daily.  . ferric citrate (AURYXIA) 1 GM 210 MG(Fe) tablet Take 210 mg by mouth 3 (three) times daily with meals.  [name] guaiFENesin-dextromethorphan (ROBITUSSIN DM) 100-10 MG/5ML syrup Take 5 mLs every 4 (four) hours as  needed by mouth for cough.  . insulin aspart (NOVOLOG) 100 UNIT/ML injection Inject 0-5 Units into the skin at bedtime.  . insulin aspart (NOVOLOG) 100 UNIT/ML injection Inject 0-15 Units into the skin 3 (three) times daily with meals.  . insulin degludec (TRESIBA) 100 UNIT/ML SOPN FlexTouch Pen Inject 36 Units into the skin daily at 10 pm.   . ipratropium-albuterol (DUONEB) 0.5-2.5 (3) MG/3ML SOLN Take 3 mLs by nebulization every 6 (six) hours as needed.  . lactulose (CHRONULAC) 10 GM/15ML solution Take 30 mLs (20 g total) by mouth daily as needed for severe constipation.  [name]  lidocaine-prilocaine (EMLA) cream Apply topically as needed (For access for dialysis).  [name] losartan (COZAAR) 50 MG tablet Take 50 mg by mouth 2 (two) times daily. On dialysis days, patient is taking in the evening  . metoprolol tartrate (LOPRESSOR) 100 MG tablet Take 1 tablet (100 mg total) by mouth 2 (two) times daily.  . OXYGEN Inhale 2-4 L into the lungs.  [name] OZEMPIC, 0.25 OR 0.5 MG/DOSE, 2 MG/1.5ML SOPN   . polyethylene glycol (MIRALAX / GLYCOLAX) packet Take 17 g by mouth daily as needed for mild constipation.  . torsemide (DEMADEX) 100 MG tablet Take 1 tablet (100 mg total) by mouth 2 (two) times daily.  . traMADol (ULTRAM) 50 MG tablet Take by mouth every 6 (six) hours as needed.   No current facility-administered medications on file prior to visit.     Allergies  Allergen Reactions  . Dust Mite Extract Shortness Of Breath and Itching  . Eggs Or Egg-Derived Products Itching    MAKES TONGUE ITCH but she will eat, only in small amounts  . Mold Extract [Trichophyton] Shortness Of Breath and Itching  . Pollen Extract Shortness Of Breath    Itching, shortness of breath, asthma like symptoms  . Sulfa Antibiotics Itching  . Feraheme [Ferumoxytol] Itching    Uses benadryl so she can get the medicine    Review Of Systems:  Constitutional:   No  weight loss, night sweats,  Fevers, chills, + fatigue, or   lassitude.  HEENT:   No headaches,  Difficulty swallowing,  Tooth/dental problems, or  Sore throat,                No sneezing, itching, ear ache, nasal congestion, post nasal drip,   CV:  No chest pain,  Orthopnea, PND, swelling in lower extremities, anasarca, dizziness, palpitations, syncope.   GI  No heartburn, indigestion, abdominal pain, nausea, vomiting, diarrhea, change in bowel habits, loss of appetite, bloody stools.   Resp: + shortness of breath with exertion or at rest.  No excess mucus, no productive cough,  No non-productive cough,  No coughing up of blood.  No change in color of mucus.  Occasional  wheezing.  No chest wall deformity  Skin: no rash, no lesions or lesions.  GU: no dysuria, change in color of urine, no urgency or frequency.  No flank pain, no hematuria   MS:  No joint pain or swelling.  + decreased range of motion.  No back pain.  Psych:  No change in mood or affect. No depression or anxiety.  No memory loss.   Vital Signs BP 138/74 (BP Location: Right Arm, Patient Position: Sitting, Cuff Size: Large)   Pulse 88   Temp 98 F (36.7 C) (Temporal)   Ht 5' 4.5" (1.638 m)   Wt (!) 137.9 kg   LMP 09/06/2016 (Approximate)   SpO2 95% Comment: on 3L  BMI 51.38 kg/m    Physical Exam:  General- No distress,  A&Ox3, pleasant female in wheelchair wearing oxygen ENT: No sinus tenderness, TM clear, pale nasal mucosa, no oral exudate,no post nasal drip, no LAN Cardiac: S1, S2, regular rate and rhythm, no murmur Chest: No wheeze/ No rales/ dullness; no accessory muscle use, no nasal flaring, no sternal retractions Abd.: Soft Non-tender, ND, BS + Ext: No clubbing cyanosis, edema Neuro:  Deconditioned, MAE x 4, A&O x 3, Body mass index is 51.38 kg/m. Skin: No rashes, No lesions. LUA HD access , + Bruit and thrill, warm and dry Psych:  normal mood and behavior   Assessment/Plan   Asthma chronic bronchitis. - Continue Dulera 2 puffs twice daily every day no  matter what.  - this is your maintenance medication - Rinse mouth after use - Continue to use Duo nebs as needed for breakthrough shortness of breath or wheezing.  - Start Claritin 10 mg daily every day   Dyspnea with chronic hypoxic respiratory failure 2/2 morbid obesity , restrictive lung disease 2/2 MO Plan  Continue Dulera as above Continue oxygen  At 3 L [place] continuously Saturation goals are 92-94%  Obesity, possible OHS; BMI of 61 with likely restrictive lung disease.  --Insurance did not approbe BiPAP  - In addition when she was tried in the hospital she did not tolerate it, most likely would not be compliant.   Deconditioning. -Likely contributing to dyspnea. -We will  referred the patient back to pulmonary rehabilitation.( [name])  Chronic diastolic congestive heart failure with cardiomegaly. -Continue to follow up with cardiology.  Chronic hypoxic respiratory failure. - Continue oxygen 3 L at rest, 4 L with hemodialysis.   - We are re- qualifying for oxygen today with a walk. .  At risk for VTE post COVID 19/ Decreased mobility Plan Pt and husband eduated regarding monitoring for new leg pain or swelling, Worsening shortness of breath , back pain and hemoptysis. Instructed to seek emergency care Asked to try to mobilize a bit more as able.   Return in about 4 months (around 07/2019).with [name]  This appointment was over 40 minutes long with over 50% of the time being direct face to face patient care, assessment , plan of care , and follow up,   Magdalen Spatz, NP 04/19/2019  10:51 PM

## 2019-04-19 NOTE — Patient Instructions (Addendum)
It is good to see you today. We will renew your [facility]. Take this medication  Every day without fail. 2 puffs in the morning and 2 puffs in the evening. Rinse mouth after use Continue using albuterol nebs, Duo Nebs  and inhaler as needed for break through shortness of breath or wheezing.  Continue wearing oxygen at 3 L [place]. Saturation goal is > 92 Please call us for any leg tenderness of swelling, new back pain, or worsening from baseline shortness of breath.  Please try to remian as active as possible.  Start Claritin 10 mg daily We will refer you back to pulmonary rehab ( Place order under Dr. Duwayne Heck)  Follow up with Cards re: Heart Failure Continue Tuesday, Thursday Saturday HD.  Follow up in 4 months with Dr. Duwayne Heck Call us sooner if you need [place] sooner.  Please contact office for sooner follow up if symptoms do not improve or worsen or seek emergency care

## 2019-04-26 ENCOUNTER — Telehealth: Payer: Self-pay | Admitting: Acute Care

## 2019-04-26 NOTE — Telephone Encounter (Signed)
Called and spoke with Educational psychologist, [place].  Vicky stated diagnosis code was needed for nebs. Diagnosis code J44.9 given.   Called to let Patient know diagnosis code was given, and nebs were being filled at pharmacy. Nothing further at this time.

## 2019-04-27 ENCOUNTER — Telehealth: Payer: Self-pay | Admitting: Primary Care

## 2019-04-27 NOTE — Telephone Encounter (Signed)
Returned Robert from [employer] called and [id number].

## 2019-04-30 NOTE — Telephone Encounter (Signed)
Lm x2 for [name] with [employer].

## 2019-05-03 NOTE — Telephone Encounter (Signed)
Called and spoke to Herbie Baltimore with [employer], who stated that he faxed over request for oxygen re certification. I have requested that Herbie Baltimore re fax this request, as we have not received it.  Will await fax.

## 2019-05-03 NOTE — Progress Notes (Signed)
Cardiology Office Note  Date:  05/04/2019   ID:  [name], DOB [birth year], MRN [id number]  PCP:  [name], NP   Chief Complaint  Patient presents with  . OTHER    OD 6 month f/u LS 2017 c/o coughing, sob, anxiety and chest pain. Meds reviewed verbally with pt.    HPI:  49 yo morbidly obese woman with uncontrolled diabetes  with history of chronic diastolic CHF,  anemia,  Asthma Morbid obesity ESRD on HD 3w a week, started 03/2017  who presents for  follow-up of her diastolic CHF.  Son presents with her today Covid 02/2019, still recovering Fatigue with flem Ache all over  Diabetes followed by PMD Struggling to lose weight  On chronic oxygen, 2 liters On RA, 90  80 to 85 % with exertion  Anemia followed by nephrology Iron infusion  Takes torsemide 100 BID HD on tues/Thurs/sat  EKG personally reviewed by myself on todays visit Shows NSR rate 90 BPM  Hemoglobin A1c in the past has been up to 43 -13   PMH:   has a past medical history of (HFpEF) heart failure with preserved ejection fraction (HCC), Anxiety, Asthma, Bronchitis, CHF (congestive heart failure) ([place]), Chronic cough, CKD (chronic kidney disease), stage III, COPD (chronic obstructive pulmonary disease) ([place]), Depression, Diabetes mellitus without complication ([place]), Diverticulosis, Dyspnea, Dysrhythmia, Environmental and seasonal allergies, Hypertension, Iron deficiency anemia, Kidney failure, Morbid obesity ([place]), Orthopnea, Oxygen dependent, Pericardial effusion, Pneumonia, Poorly controlled diabetes mellitus ([place]), and Swelling of both lower extremities.  PSH:    Past Surgical History:  Procedure Laterality Date  . A/V FISTULAGRAM Left 06/09/2017   Procedure: A/V FISTULAGRAM;  Surgeon: Algernon Huxley, MD;  Location: [place] [facility];  Service: Cardiovascular;  Laterality: Left;  . A/V FISTULAGRAM Left 06/16/2017   Procedure: A/V FISTULAGRAM;  Surgeon: Algernon Huxley, MD;  Location: [place]  [facility];  Service: Cardiovascular;  Laterality: Left;  . A/V FISTULAGRAM Left 01/28/2018   Procedure: A/V FISTULAGRAM;  Surgeon: Algernon Huxley, MD;  Location: [place] [facility];  Service: Cardiovascular;  Laterality: Left;  . AV FISTULA PLACEMENT Left 04/25/2017   Procedure: ARTERIOVENOUS (AV) FISTULA CREATION;  Surgeon: Algernon Huxley, MD;  Location: [facility];  Service: Vascular;  Laterality: Left;  . CATARACT EXTRACTION W/PHACO Left 01/21/2018   Procedure: CATARACT EXTRACTION PHACO AND INTRAOCULAR LENS PLACEMENT (IOC);  Surgeon: Leandrew Koyanagi, MD;  Location: [facility] OR[facility];  Service: Ophthalmology;  Laterality: Left;  [place] 01:55.5 CDE 9.05 EAUP 13.9 Fluid Pack Lot # [id number]  . CESAREAN SECTION     times 3  . DIALYSIS/PERMA CATHETER INSERTION Right 04/04/2017   Procedure: DIALYSIS/PERMA CATHETER INSERTION;  Surgeon: Katha Cabal, MD;  Location: [place] [facility];  Service: Cardiovascular;  Laterality: Right;  . DIALYSIS/PERMA CATHETER REMOVAL N/A 09/17/2017   Procedure: DIALYSIS/PERMA CATHETER REMOVAL;  Surgeon: Algernon Huxley, MD;  Location: [place] [facility];  Service: Cardiovascular;  Laterality: N/A;  . INSERTION OF DIALYSIS CATHETER N/A 04/16/2017   Procedure: INSERTION OF DIALYSIS PERM CATHETER;  Surgeon: Algernon Huxley, MD;  Location: [facility];  Service: Vascular;  Laterality: N/A;    Current Outpatient Medications  Medication Sig Dispense Refill  . acetaminophen (TYLENOL) 500 MG tablet Take 1,000 mg by mouth every 6 (six) hours as needed for mild pain, fever or headache.    . albuterol (PROVENTIL HFA;VENTOLIN HFA) 108 (90 Base) MCG/ACT inhaler Inhale 2 puffs into the lungs every 6 (six) hours  as needed for wheezing or shortness of breath. (Patient taking differently: Inhale 2 puffs into the lungs every 4 (four) hours as needed for wheezing or shortness of breath. ) 1 Inhaler 3  . albuterol (PROVENTIL) (2.5 MG/3ML) 0.083% nebulizer solution Take 2.5 mg by nebulization every 6 (six)  hours as needed for wheezing or shortness of breath.    [name] aspirin EC 81 MG tablet Take 81 mg by mouth daily.     [name] atorvastatin (LIPITOR) 80 MG tablet Take 80 mg by mouth daily at 6 PM.    . b complex-vitamin c-folic acid (NEPHRO-VITE) 0.8 MG TABS tablet Take 1 tablet by mouth at bedtime.    . Cholecalciferol (HM VITAMIN D3) 4000 units CAPS Take 1 capsule by mouth once a week.    . cloNIDine (CATAPRES) 0.2 MG tablet Take 1 tablet (0.2 mg total) by mouth 2 (two) times daily. (Patient taking differently: Take 0.2 mg by mouth 2 (two) times daily. ) 30 tablet 0  . docusate sodium (COLACE) 100 MG capsule Take 100 mg by mouth daily.    [name] ELDERBERRY PO Take by mouth daily.    . ferric citrate (AURYXIA) 1 GM 210 MG(Fe) tablet Take 210 mg by mouth 3 (three) times daily with meals.    . insulin aspart (NOVOLOG) 100 UNIT/ML injection Inject 0-5 Units into the skin at bedtime. 10 mL 11  . insulin aspart (NOVOLOG) 100 UNIT/ML injection Inject 0-15 Units into the skin 3 (three) times daily with meals. 10 mL 11  . insulin degludec (TRESIBA) 100 UNIT/ML SOPN FlexTouch Pen Inject 36 Units into the skin daily at 10 pm.     . ipratropium-albuterol (DUONEB) 0.5-2.5 (3) MG/3ML SOLN Take 3 mLs by nebulization every 6 (six) hours as needed. 360 mL 5  . lactulose (CHRONULAC) 10 GM/15ML solution Take 30 mLs (20 g total) by mouth daily as needed for severe constipation. 240 mL 0  . lidocaine-prilocaine (EMLA) cream Apply topically as needed (For access for dialysis).    [name] losartan (COZAAR) 50 MG tablet Take 50 mg by mouth 2 (two) times daily. On dialysis days, patient is taking in the evening    . metoprolol tartrate (LOPRESSOR) 100 MG tablet Take 1 tablet (100 mg total) by mouth 2 (two) times daily. 60 tablet 0  . OXYGEN Inhale 2-4 L into the lungs.    [name] OZEMPIC, 0.25 OR 0.5 MG/DOSE, 2 MG/1.5ML SOPN     . polyethylene glycol (MIRALAX / GLYCOLAX) packet Take 17 g by mouth daily as needed for mild constipation. 14 each 0   . torsemide (DEMADEX) 100 MG tablet Take 1 tablet (100 mg total) by mouth 2 (two) times daily. 180 tablet 0  . traMADol (ULTRAM) 50 MG tablet Take by mouth every 6 (six) hours as needed.     Current Facility-Administered Medications  Medication Dose Route Frequency Provider Last Rate Last Admin  . mometasone-formoterol (DULERA) 200-5 MCG/ACT inhaler 2 puff  2 puff Inhalation BID Magdalen Spatz, NP         Allergies:   Dust mite extract, Eggs or egg-derived products, Mold extract [trichophyton], Pollen extract, Sulfa antibiotics, and Feraheme [ferumoxytol]   Social History:  The patient  reports that she has never smoked. She has never used smokeless tobacco. She reports current alcohol use. She reports current drug use. Drug: Marijuana.   Family History:   family history includes Breast cancer (age of onset: 40) in her maternal aunt and maternal aunt; Diabetes in her  father, mother, and another family member; Hypertension in her father, mother, and another family member.    Review of Systems: Review of Systems  Constitutional: Negative.   HENT: Negative.   Respiratory: Positive for shortness of breath.   Cardiovascular: Negative.   Gastrointestinal: Negative.   Musculoskeletal: Negative.        Leg weakness, gait instability  Neurological: Negative.   Psychiatric/Behavioral: Negative.   All other systems reviewed and are negative.   PHYSICAL EXAM: VS:  BP 118/78 (BP Location: Right Wrist, Patient Position: Sitting, Cuff Size: Large)   Pulse (!) 190   Ht 5' 4.35" (1.634 m)   Wt (!) 306 lb (138.8 kg)   LMP 09/06/2016 (Approximate)   SpO2 98%   BMI 51.95 kg/m  , BMI Body mass index is 51.95 kg/m. Constitutional:  oriented to person, place, and time. No distress.  Morbidly obese, presenting in wheelchair HENT:  Head: Grossly normal Eyes:  no discharge. No scleral icterus.  Neck: No JVD, no carotid bruits  Cardiovascular: Regular rate and rhythm, no murmurs  appreciated Pulmonary/Chest: Clear to auscultation bilaterally, expiratory wheezing is noted Abdominal: Soft.  no distension.  no tenderness.  Musculoskeletal: Normal range of motion Neurological:  normal muscle tone. Coordination normal. No atrophy Skin: Skin warm and dry Psychiatric: normal affect, pleasant   Recent Labs: No results found for requested labs within last 8760 hours.    Lipid Panel Lab Results  Component Value Date   CHOL 232 (H) 01/03/2015   HDL 41 01/03/2015   LDLCALC 169 (H) 01/03/2015   TRIG 112 01/03/2015      Wt Readings from Last 3 Encounters:  05/04/19 (!) 306 lb (138.8 kg)  04/19/19 (!) 304 lb (137.9 kg)  04/03/18 (!) 300 lb 3.2 oz (136.2 kg)    ASSESSMENT AND PLAN:  Chronic diastolic congestive heart failure (HCC) - Fluid managed by hemodialysis Reports that she continues to take torsemide 100 daily  Essential hypertension -  Blood pressure is well controlled on today's visit. No changes made to the medications.  Asthma, unspecified asthma severity, unspecified whether complicated, unspecified whether persistent - Recent Covid infection, still recovering, wheezing appreciated on exam On home oxygen therapy   Morbid obesity due to excess calories ([place]) History of poor diet  Poorly controlled type 2 diabetes mellitus ([place]) Hemoglobin A1c chronically elevated Stressed importance of aggressive dietary changes  Shortness of breath Multifactorial from morbid obesity, deconditioning, pulmonary edema Trying to lose weight through dietary restriction  Anemia Managed by nephrology   Total encounter time more than 25 minutes  Greater than 50% was spent in counseling and coordination of care with the patient   Disposition:   F/U  12 months   Orders Placed This Encounter  Procedures  . EKG 12-Lead     Signed, Esmond Plants, M.D., Ph.D. 05/04/2019  [place], [place]

## 2019-05-03 NOTE — Telephone Encounter (Signed)
CMN has been received and faxed to [place] at our [place] office to complete.

## 2019-05-04 ENCOUNTER — Other Ambulatory Visit: Payer: Self-pay

## 2019-05-04 ENCOUNTER — Encounter: Payer: Self-pay | Admitting: Cardiovascular Disease

## 2019-05-04 ENCOUNTER — Ambulatory Visit (INDEPENDENT_AMBULATORY_CARE_PROVIDER_SITE_OTHER): Payer: Medicare Other | Admitting: Cardiovascular Disease

## 2019-05-04 VITALS — BP 118/78 | HR 190 | Ht 64.35 in | Wt 306.0 lb

## 2019-05-04 DIAGNOSIS — Z794 Long term (current) use of insulin: Secondary | ICD-10-CM

## 2019-05-04 DIAGNOSIS — I5033 Acute on chronic diastolic (congestive) heart failure: Secondary | ICD-10-CM

## 2019-05-04 DIAGNOSIS — I10 Essential (primary) hypertension: Secondary | ICD-10-CM | POA: Diagnosis not present

## 2019-05-04 DIAGNOSIS — Z992 Dependence on renal dialysis: Secondary | ICD-10-CM

## 2019-05-04 DIAGNOSIS — E1122 Type 2 diabetes mellitus with diabetic chronic kidney disease: Secondary | ICD-10-CM

## 2019-05-04 DIAGNOSIS — N186 End stage renal disease: Secondary | ICD-10-CM | POA: Diagnosis not present

## 2019-05-04 DIAGNOSIS — I5032 Chronic diastolic (congestive) heart failure: Secondary | ICD-10-CM | POA: Diagnosis not present

## 2019-05-04 DIAGNOSIS — I110 Hypertensive heart disease with heart failure: Secondary | ICD-10-CM

## 2019-05-04 NOTE — Patient Instructions (Signed)

## 2019-05-05 NOTE — Telephone Encounter (Signed)
CMN has not been received from [facility] [place].  Rodena Piety, please advise if CMN has been given to [facility] for signature. Thanks

## 2019-05-05 NOTE — Telephone Encounter (Signed)
There have been 2 issues with this CMN it was sent to the wrong provider. I had to call and get them to resend for [name] to sign. Judson Roch is in the office today and I will give the CMN to her to sign

## 2019-05-06 NOTE — Telephone Encounter (Signed)
This form has been signed by Judson Roch and has been faxed to [place]. I received confirmation that the fax was received

## 2019-06-07 ENCOUNTER — Other Ambulatory Visit: Payer: Self-pay

## 2019-06-07 ENCOUNTER — Ambulatory Visit (INDEPENDENT_AMBULATORY_CARE_PROVIDER_SITE_OTHER): Payer: Medicare HMO | Admitting: Dermatology

## 2019-06-07 DIAGNOSIS — L910 Hypertrophic scar: Secondary | ICD-10-CM | POA: Diagnosis not present

## 2019-06-07 NOTE — Patient Instructions (Signed)
Serica scar gel

## 2019-06-07 NOTE — Progress Notes (Signed)
   Follow-Up Visit   Subjective  [name] is a 49 y.o. female who presents for the following: keloids (L neck, L arm, IL injections of Kenalog 40mg /ml on 04/19/19). She has noticed some softening of keloids  The following portions of the chart were reviewed this encounter and updated as appropriate:     Review of Systems: No other skin or systemic complaints.  Objective  Well appearing patient in no apparent distress; mood and affect are within normal limits.  A focused examination was performed including L neck, L arm. Relevant physical exam findings are noted in the Assessment and Plan.  Objective  L arm, L neck, L upper chest: Firm pink/brown dermal papule(s)/plaque(s) L neck, L arm, L upper chest  Assessment & Plan  Keloid (3) L neck; L arm; L upper chest  Recommend Serica gel for scars qd/bid, sample given IL steroid today to L neck and upper arm.  Will add in L upper chest on f/u  Intralesional injection - L arm, L neck Location: L neck, L arm  Informed Consent: Discussed risks (infection, pain, bleeding, bruising, thinning of the skin, loss of skin pigment, lack of resolution, and recurrence of lesion) and benefits of the procedure, as well as the alternatives. Informed consent was obtained. Preparation: The area was prepared a standard fashion.  Anesthesia:None  Procedure Details: An intralesional injection was performed with Kenalog 40 mg/cc. 1.5 cc in total were injected. L neck 0.2cc, L arm 1.3cc  Total number of injections: >7  Lot # [id number] exp 03/2020  Plan: The patient was instructed on post-op care. Recommend OTC analgesia as needed for pain.   Return in about 6 weeks (around 07/19/2019).   I, Othelia Pulling, RMA, am acting as scribe for [name] Patty, MD .

## 2019-06-22 ENCOUNTER — Other Ambulatory Visit: Payer: Self-pay | Admitting: Internal Medicine

## 2019-07-07 ENCOUNTER — Telehealth: Payer: Self-pay | Admitting: Acute Care

## 2019-07-07 MED ORDER — BUDESONIDE-FORMOTEROL FUMARATE 160-4.5 MCG/ACT IN AERO: 2.0000 | INHALATION_SPRAY | Freq: Two times a day (BID) | RESPIRATORY_TRACT | 5 refills | Status: DC

## 2019-07-07 NOTE — Telephone Encounter (Signed)
Pt was prescribed Dulera by Eric Form and she said that the cost is $900 with her insurance. She would like to know if we had any samples or if something else could be prescribed that would be at a lower price. Cb#: [phone]

## 2019-07-07 NOTE — Telephone Encounter (Signed)
Pt calling back to state that her insurance will cover these medicines Advair and symbicort. Pt can be reached at [phone]

## 2019-07-07 NOTE — Telephone Encounter (Signed)
Rx sent to pharm and I spoke with the pt and notified her of this  Nothing further needed

## 2019-07-07 NOTE — Telephone Encounter (Signed)
Former [name] pt seen by Judson Roch last and is on [place] 200 but ins stopped covering  She called and found that they will cover advair and symbicort  Please advise on alternative, thanks

## 2019-07-07 NOTE — Telephone Encounter (Signed)
Symbicort 160/4.5, 2 inhalations twice a day.

## 2019-07-07 NOTE — Telephone Encounter (Signed)
Spoke with the pt  I advised no samples of Dulera  Her insurance stopped covering this  She will call to check formulary and call back with list of alternatives and then will send msg to provider  Will await call back

## 2019-07-24 ENCOUNTER — Telehealth: Payer: Self-pay | Admitting: Internal Medicine

## 2019-07-24 MED ORDER — PREDNISONE 10 MG PO TABS: ORAL_TABLET | ORAL | 0 refills | Status: DC

## 2019-07-24 NOTE — Telephone Encounter (Signed)
Says has copd, never smoker on symbicort and duoneb and wheezing/ desats despite 3lpm (her usual) and presently in HD unit where nurse hears wheezing also   rec  Prednisone 10 mg take  4 each am x 2 days,   2 each am x 2 days,  1 each am x 2 days and stop  Needs to re-establish with pulmonary doc  ? In [place] or in [place] (had [employer] but doesn't have doc now ) and I don't see any pfts to document but I doubt strongly this is copd

## 2019-07-27 NOTE — Telephone Encounter (Signed)
Spoke with the pt  She feels much improved  I have scheduled her to see Dr Patsey Berthold on 09/01/19  She is aware to call sooner if needed

## 2019-08-11 ENCOUNTER — Telehealth: Payer: Self-pay

## 2019-08-11 NOTE — Telephone Encounter (Signed)
Received record request from [facility]. This request has been forwarded to [facility] via fax.

## 2019-08-26 ENCOUNTER — Other Ambulatory Visit (HOSPITAL_COMMUNITY): Payer: Self-pay | Admitting: Nurse Practitioner

## 2019-08-26 ENCOUNTER — Other Ambulatory Visit: Payer: Self-pay | Admitting: Nurse Practitioner

## 2019-08-26 DIAGNOSIS — G8929 Other chronic pain: Secondary | ICD-10-CM

## 2019-09-01 ENCOUNTER — Ambulatory Visit (INDEPENDENT_AMBULATORY_CARE_PROVIDER_SITE_OTHER): Payer: Medicare HMO | Admitting: Pulmonary Disease

## 2019-09-01 ENCOUNTER — Encounter: Payer: Self-pay | Admitting: Pulmonary Disease

## 2019-09-01 ENCOUNTER — Other Ambulatory Visit: Payer: Self-pay

## 2019-09-01 VITALS — BP 124/76 | HR 78 | Temp 98.7°F | Ht 64.5 in | Wt 292.0 lb

## 2019-09-01 DIAGNOSIS — J9612 Chronic respiratory failure with hypercapnia: Secondary | ICD-10-CM

## 2019-09-01 DIAGNOSIS — J209 Acute bronchitis, unspecified: Secondary | ICD-10-CM | POA: Diagnosis not present

## 2019-09-01 DIAGNOSIS — J4541 Moderate persistent asthma with (acute) exacerbation: Secondary | ICD-10-CM | POA: Diagnosis not present

## 2019-09-01 DIAGNOSIS — N186 End stage renal disease: Secondary | ICD-10-CM

## 2019-09-01 DIAGNOSIS — E662 Morbid (severe) obesity with alveolar hypoventilation: Secondary | ICD-10-CM | POA: Diagnosis not present

## 2019-09-01 DIAGNOSIS — Z992 Dependence on renal dialysis: Secondary | ICD-10-CM

## 2019-09-01 DIAGNOSIS — J9611 Chronic respiratory failure with hypoxia: Secondary | ICD-10-CM | POA: Diagnosis not present

## 2019-09-01 MED ORDER — TRELEGY ELLIPTA 200-62.5-25 MCG/INH IN AEPB: 1.0000 | INHALATION_SPRAY | Freq: Every day | RESPIRATORY_TRACT | 0 refills | Status: DC

## 2019-09-01 MED ORDER — AZITHROMYCIN 250 MG PO TABS: ORAL_TABLET | ORAL | 0 refills | Status: AC

## 2019-09-01 NOTE — Progress Notes (Signed)
Subjective:    Patient ID: [name], female    DOB: 11/21/1970, 49 y.o.   MRN: [id number]  HPI Patient is a 49 year old lifelong never smoker with morbid obesity (BMI 49) and history of diastolic heart failure and obesity with obesity hypoventilation syndrome and asthma who follows for the issue of asthma.  She has chronic respiratory failure with hypercapnia and hypoxia.  She is on oxygen at 3 L/min with 4 L/min during hemodialysis.  Previously she was  followed by Dr. Ashby Dawes.  I am assuming care after Dr. Mathis Fare departure from the practice.  This is my first time seeing this patient.  The patient was recently switched from [facility] to Symbicort due to insurance noncoverage of [facility].  She has had issues over the last 3 to 4 weeks with increased congestion purulent nasal drainage and cough.  Has had a few episodes of cough related syncope.  No fevers, chills or sweats.  No hemoptysis.  Cough is for the most part nonproductive but does sound congested.  When she does produce sputum it is purulent appearing.  She did have a prednisone taper this was proximately 3 weeks ago.  No antibiotics.  Had prior issues with COVID-19 on January 2021 but never required hospitalization.  She self treated at home with nebulizers and a homeopathic support.  She states overall she did "well".  By arterial blood gases drawn in 2019 she met criteria for obesity with obesity hypoventilation syndrome.   She does not describe any orthopnea or paroxysmal nocturnal dyspnea.  She has not had any lower extremity edema.  No chest pai  Review of Systems A 10 point review of systems was performed and it is as noted above otherwise negative. Past Medical History:  Diagnosis Date  . (HFpEF) heart failure with preserved ejection fraction ([place])    a. 11/2014 Echo: EF 50-55%, Gr1 DD; b. 06/2016 Echo: EF 60-65%, no rwma, mod dil LA, nl RV, triv eff.  . Anxiety   . Asthma   . Bronchitis   . CHF (congestive heart  failure) ([place])   . Chronic cough   . Chronic respiratory failure with hypoxia and hypercapnia (HCC)    a. Home O2.  (2-4L)  . CKD (chronic kidney disease), stage III   . Depression   . Diabetes mellitus without complication ([place])   . Diverticulosis   . Dyspnea   . Dysrhythmia    per pt, no arrythmias  . Environmental and seasonal allergies   . Extreme obesity with alveolar hypoventilation (HCC)   . Hypertension   . Iron deficiency anemia    a. chronic blood loss->heavy menses.  . Kidney failure    dialysis tues/thur/sat   ... left arm shunt  . Orthopnea   . Oxygen dependent    3l/min  . Pericardial effusion    a. 11/2014 CT Chest: Mod effusion; b. 11/2014 Echo: small to mod circumferential effusion. No hemodynamic compromise.  . Pneumonia    history  . Poorly controlled diabetes mellitus ([place])    a. 11/2014 A1c 13.2.  [name] Swelling of both lower extremities     Past Surgical History:  Procedure Laterality Date  . A/V FISTULAGRAM Left 06/09/2017   Procedure: A/V FISTULAGRAM;  Surgeon: Algernon Huxley, MD;  Location: [place] [facility];  Service: Cardiovascular;  Laterality: Left;  . A/V FISTULAGRAM Left 06/16/2017   Procedure: A/V FISTULAGRAM;  Surgeon: Algernon Huxley, MD;  Location: [place] [facility];  Service: Cardiovascular;  Laterality:  Left;  . A/V FISTULAGRAM Left 01/28/2018   Procedure: A/V FISTULAGRAM;  Surgeon: Algernon Huxley, MD;  Location: [place] [facility];  Service: Cardiovascular;  Laterality: Left;  . AV FISTULA PLACEMENT Left 04/25/2017   Procedure: ARTERIOVENOUS (AV) FISTULA CREATION;  Surgeon: Algernon Huxley, MD;  Location: [facility];  Service: Vascular;  Laterality: Left;  . CATARACT EXTRACTION W/PHACO Left 01/21/2018   Procedure: CATARACT EXTRACTION PHACO AND INTRAOCULAR LENS PLACEMENT (IOC);  Surgeon: Leandrew Koyanagi, MD;  Location: [facility];  Service: Ophthalmology;  Laterality: Left;  [place] 01:55.5 CDE 9.05 EAUP 13.9 Fluid Pack Lot # [id number]  . CESAREAN  SECTION     times 3  . DIALYSIS/PERMA CATHETER INSERTION Right 04/04/2017   Procedure: DIALYSIS/PERMA CATHETER INSERTION;  Surgeon: Katha Cabal, MD;  Location: [place] [facility];  Service: Cardiovascular;  Laterality: Right;  . DIALYSIS/PERMA CATHETER REMOVAL N/A 09/17/2017   Procedure: DIALYSIS/PERMA CATHETER REMOVAL;  Surgeon: Algernon Huxley, MD;  Location: [place] [facility];  Service: Cardiovascular;  Laterality: N/A;  . INSERTION OF DIALYSIS CATHETER N/A 04/16/2017   Procedure: INSERTION OF DIALYSIS PERM CATHETER;  Surgeon: Algernon Huxley, MD;  Location: [facility];  Service: Vascular;  Laterality: N/A;   Family History  Problem Relation Age of Onset  . Diabetes Mother   . Hypertension Mother   . Hypertension Father   . Diabetes Father   . Hypertension Other   . Diabetes Other   . Breast cancer Maternal Aunt 51  . Breast cancer Maternal Aunt 38   Social History   Tobacco Use  . Smoking status: Never Smoker  . Smokeless tobacco: Never Used  Substance Use Topics  . Alcohol use: Yes    Alcohol/week: 0.0 standard drinks    Comment: occasional   Does smoke marijuana on occasion.  Allergies  Allergen Reactions  . Dust Mite Extract Shortness Of Breath and Itching  . Eggs Or Egg-Derived Products Itching    MAKES TONGUE ITCH but she will eat, only in small amounts  . Mold Extract [Trichophyton] Shortness Of Breath and Itching  . Pollen Extract Shortness Of Breath    Itching, shortness of breath, asthma like symptoms  . Sulfa Antibiotics Itching  . Feraheme [Ferumoxytol] Itching    Uses benadryl so she can get the medicine   Current Meds  Medication Sig  . acetaminophen (TYLENOL) 500 MG tablet Take 1,000 mg by mouth every 6 (six) hours as needed for mild pain, fever or headache.  . albuterol (PROVENTIL HFA;VENTOLIN HFA) 108 (90 Base) MCG/ACT inhaler Inhale 2 puffs into the lungs every 6 (six) hours as needed for wheezing or shortness of breath. (Patient taking differently:  Inhale 2 puffs into the lungs every 4 (four) hours as needed for wheezing or shortness of breath. )  . albuterol (PROVENTIL) (2.5 MG/3ML) 0.083% nebulizer solution Take 2.5 mg by nebulization every 6 (six) hours as needed for wheezing or shortness of breath.  [name] aspirin EC 81 MG tablet Take 81 mg by mouth daily.   [name] atorvastatin (LIPITOR) 80 MG tablet Take 80 mg by mouth daily at 6 PM.  . b complex-vitamin c-folic acid (NEPHRO-VITE) 0.8 MG TABS tablet Take 1 tablet by mouth at bedtime.  . budesonide-formoterol (SYMBICORT) 160-4.5 MCG/ACT inhaler Inhale 2 puffs into the lungs 2 (two) times daily.  . Cholecalciferol (HM VITAMIN D3) 4000 units CAPS Take 1 capsule by mouth once a week.  . cloNIDine (CATAPRES) 0.2 MG tablet Take 1 tablet (0.2 mg total)  by mouth 2 (two) times daily. (Patient taking differently: Take 0.2 mg by mouth 2 (two) times daily. )  . docusate sodium (COLACE) 100 MG capsule Take 100 mg by mouth daily.  [name] ELDERBERRY PO Take by mouth daily.  . ferric citrate (AURYXIA) 1 GM 210 MG(Fe) tablet Take 210 mg by mouth 3 (three) times daily with meals.  . insulin aspart (NOVOLOG) 100 UNIT/ML injection Inject 0-5 Units into the skin at bedtime.  . insulin aspart (NOVOLOG) 100 UNIT/ML injection Inject 0-15 Units into the skin 3 (three) times daily with meals.  . insulin degludec (TRESIBA) 100 UNIT/ML SOPN FlexTouch Pen Inject 36 Units into the skin daily at 10 pm.   . ipratropium-albuterol (DUONEB) 0.5-2.5 (3) MG/3ML SOLN Take 3 mLs by nebulization every 6 (six) hours as needed.  . lactulose (CHRONULAC) 10 GM/15ML solution Take 30 mLs (20 g total) by mouth daily as needed for severe constipation.  . lidocaine-prilocaine (EMLA) cream Apply topically as needed (For access for dialysis).  [name] losartan (COZAAR) 50 MG tablet Take 50 mg by mouth 2 (two) times daily. On dialysis days, patient is taking in the evening  . metoprolol tartrate (LOPRESSOR) 100 MG tablet Take 1 tablet (100 mg total) by mouth  2 (two) times daily.  . OXYGEN Inhale 2-4 L into the lungs.  [name] OZEMPIC, 0.25 OR 0.5 MG/DOSE, 2 MG/1.5ML SOPN   . polyethylene glycol (MIRALAX / GLYCOLAX) packet Take 17 g by mouth daily as needed for mild constipation.  . torsemide (DEMADEX) 100 MG tablet Take 1 tablet (100 mg total) by mouth 2 (two) times daily.  . traMADol (ULTRAM) 50 MG tablet Take by mouth every 6 (six) hours as needed.  . [DISCONTINUED] predniSONE (DELTASONE) 10 MG tablet Take  4 each am x 2 days,   2 each am x 2 days,  1 each am x 2 days and stop      Objective:   Physical Exam BP 124/76 (BP Location: Left Arm, Cuff Size: Large)   Pulse 78   Temp 98.7 F (37.1 C) (Temporal)   Ht 5' 4.5" (1.638 m)   Wt 292 lb (132.5 kg) Comment: weight is per pt  LMP 09/06/2016 (Approximate)   SpO2 98%   BMI 49.35 kg/m She is on oxygen at 3 L/min.  GENERAL: Obese woman in no acute respiratory distress.  Sounds nasally congested, coughing with congested cough.  She presents in a transport chair.  She is wearing oxygen via nasal cannula. HEAD: Normocephalic, atraumatic.  EYES: Pupils equal, round, reactive to light.  No scleral icterus.  MOUTH: Nose/mouth/throat not examined due to masking requirements for COVID 19. NECK: Supple. No thyromegaly. Trachea midline. No JVD.  No adenopathy. PULMONARY: Symmetrical air entry.  She has scattered rhonchi and wheezes noted.  Moving air fairly well however. CARDIOVASCULAR: S1 and S2. Regular rate and rhythm.  No rubs or gallops heard.  Grade 2/6 systolic ejection murmur radiating to axilla. GASTROINTESTINAL: Obese abdomen, nondistended. MUSCULOSKELETAL: No joint deformity, no clubbing, no edema.  No calf tenderness.  AV fistula left upper extremity with good thrill. NEUROLOGIC: Awake, alert, no overt focal deficits.  Speech is fluent.  Gait not assessed as patient is on transport chair. SKIN: Intact,warm,dry.  No overt rashes noted. PSYCH: Mood and behavior appropriate.     Assessment &  Plan:     ICD-10-CM   1. Moderate persistent asthma with acute exacerbation in adult  J45.41    SHE DOES NOT HAVE COPD Exacerbation due  to acute bronchitis Switch to Trelegy Ellipta 200/62.5/25, 1 puff daily   2. Acute bronchitis, unspecified organism  J20.9    Azithromycin Z-Pak as directed  3. Extreme obesity with alveolar hypoventilation (HCC)  E66.2    This issue adds complexity to her management Obesity perpetuates inflammatory response of asthma  4. Chronic respiratory failure with hypoxia and hypercapnia (HCC)  J96.11    J96.12    Due to extreme obesity with obesity hypoventilation Continue oxygen supplementation  5. ESRD (end stage renal disease) on dialysis [facility])  N18.6    Z99.2    This issue adds complexity to her management Dialysis T-Th-Sat   Meds ordered this encounter  Medications  . Fluticasone-Umeclidin-Vilant (TRELEGY ELLIPTA) 200-62.5-25 MCG/INH AEPB    Sig: Inhale 1 puff into the lungs daily.    Dispense:  14 each    Refill:  0    Order Specific Question:   Lot Number?    Answer:   [id number]    Order Specific Question:   Expiration Date?    Answer:   11/25/2020    Order Specific Question:   Manufacturer?    Answer:   Gl[employer]SmithK[employer] [[id number]]    Order Specific Question:   Quantity    Answer:   1  . azithromycin (ZITHROMAX) 250 MG tablet    Sig: Take 2 tablets (500 mg) on  Day 1,  followed by 1 tablet (250 mg) once daily on Days 2 through 5.    Dispense:  6 each    Refill:  0   Discussion: This patient has ASTHMA.  She is a lifelong never smoker.  Does smoke occasional marijuana.  She does not have COPD.  I have reviewed her records extensively although there are no pulmonary function tests recorded prior CT scans of the chest do not demonstrate findings consistent with COPD.  [name] history also not consistent with COPD.  We have corrected her past medical history.  We will obtain PFTs once she is over this current exacerbation.  She was treated with prednisone  with some relief however she does have evidence of bacterial bronchitis and will be treated for that.  We will switch her to Trelegy Ellipta 200/62.5/25, 1 inhalation daily.  The patient is to let us know how this does for her.  We provided her with samples today.    She has issues with obesity and obesity hypoventilation, she is actually engage in weight loss as demonstrated by prior BMI of 61 now down to 49.3.  She is engaged in weight loss to try to have a kidney transplant.  We will see her in follow-up in 4 to 6 weeks time.  She is to contact us prior to that time should any new difficulties arise.    Renold Don, MD [facility]   *This note was dictated using voice recognition software/Dragon.  Despite best efforts to proofread, errors can occur which can change the meaning.  Any change was purely unintentional.

## 2019-09-01 NOTE — Patient Instructions (Signed)
What we discussed today:  We are going to change your inhaler to Trelegy Ellipta 200/62.5/25 1 puff daily, rinse your mouth well after you use it.  You have 14 days on the sample please let us know how this does for you so we can call it in for you if it works.  Do not use Symbicort while using Trelegy.  You may still use your rescue inhaler if needed.  We are going to treat you with a azithromycin (Z-Pak) prescription has been sent to your pharmacy  We will see you in follow-up in 4 to 6 weeks time call sooner should any new difficulties arise

## 2019-09-03 ENCOUNTER — Encounter: Payer: Self-pay | Admitting: Pulmonary Disease

## 2019-09-03 DIAGNOSIS — J9611 Chronic respiratory failure with hypoxia: Secondary | ICD-10-CM | POA: Insufficient documentation

## 2019-09-03 DIAGNOSIS — E662 Morbid (severe) obesity with alveolar hypoventilation: Secondary | ICD-10-CM | POA: Insufficient documentation

## 2019-09-07 ENCOUNTER — Telehealth: Payer: Self-pay | Admitting: Pulmonary Disease

## 2019-09-07 NOTE — Telephone Encounter (Signed)
Lm for pt

## 2019-09-08 MED ORDER — TRELEGY ELLIPTA 200-62.5-25 MCG/INH IN AEPB: 1.0000 | INHALATION_SPRAY | Freq: Every day | RESPIRATORY_TRACT | 2 refills | Status: DC

## 2019-09-08 NOTE — Telephone Encounter (Signed)
Pt is requesting Rx for trelegy, as she feels that this medication is effective. Rx for Trelegy has been sent to preferred pharmacy. Nothing further is needed.

## 2019-09-10 ENCOUNTER — Ambulatory Visit
Admission: RE | Admit: 2019-09-10 | Discharge: 2019-09-10 | Disposition: A | Discharge status: Home / Self Care | Payer: Medicare HMO | Source: Ambulatory Visit | Attending: Nurse Practitioner | Admitting: Nurse Practitioner

## 2019-09-10 ENCOUNTER — Other Ambulatory Visit: Payer: Self-pay

## 2019-09-10 DIAGNOSIS — M545 Low back pain, unspecified: Secondary | ICD-10-CM

## 2019-09-10 DIAGNOSIS — G8929 Other chronic pain: Secondary | ICD-10-CM | POA: Diagnosis present

## 2019-09-10 IMAGING — MR MR LUMBAR SPINE W/O CM
4 series · 32 of 48 positions shown · non-contrast
Comparison: None.

CLINICAL DATA: Low back pain, bilateral hip pain

EXAM:
MRI LUMBAR SPINE WITHOUT CONTRAST
TECHNIQUE: Multiplanar, multisequence MR imaging of the lumbar spine was
performed. No intravenous contrast was administered.

[Series 5: T2 · sagittal · 4.0mm · 0.81mm/px · 8 of 17 slices shown (1 of 2)]
[im 1/17]
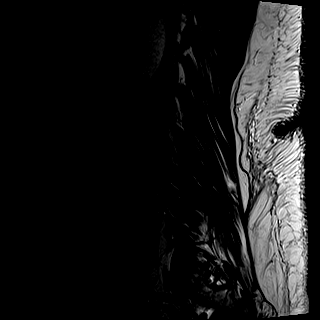
[im 2/17]
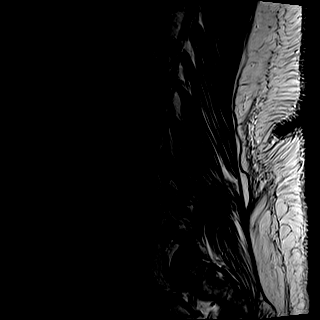
[im 6/17]
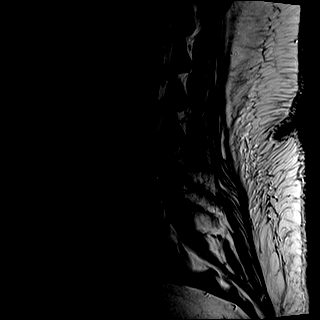
[im 8/17]
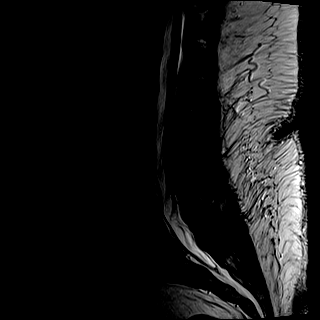
[im 9/17]
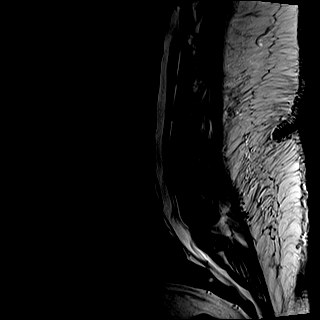
[im 11/17]
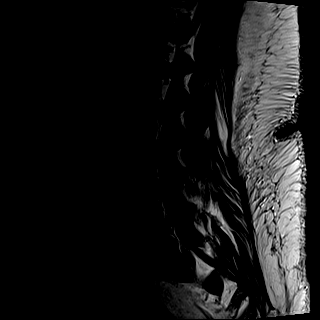
[im 15/17]
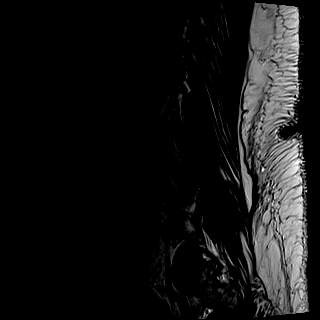
[im 17/17]
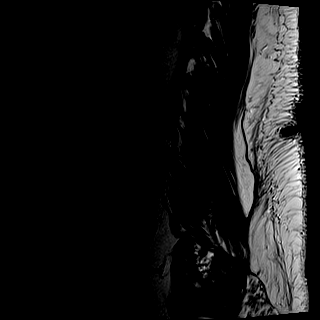

[Series 6: T1 · sagittal · 4.0mm · 0.81mm/px · 9 of 17 slices shown]
[im 1/17]
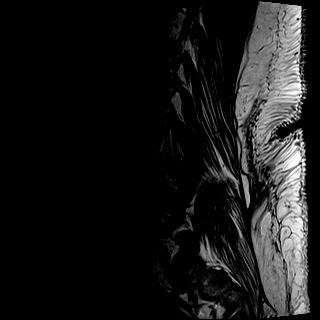
[im 3/17]
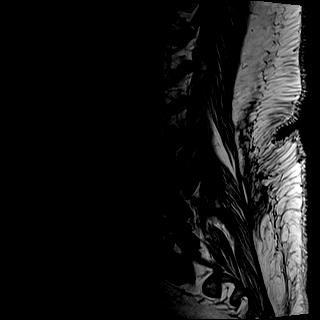
[im 5/17]
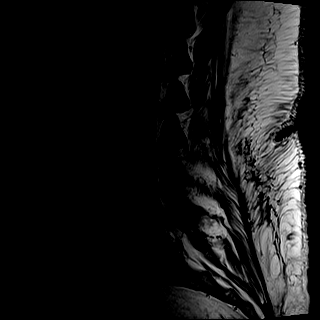
[im 7/17]
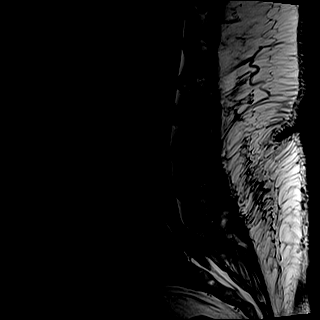
[im 9/17]
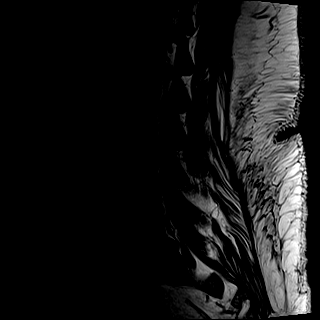
[im 11/17]
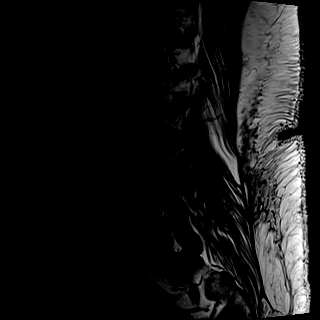
[im 13/17]
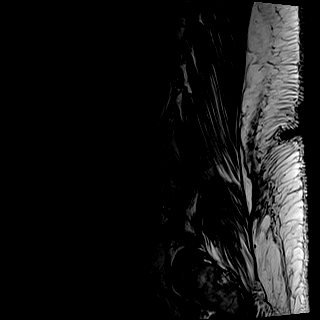
[im 15/17]
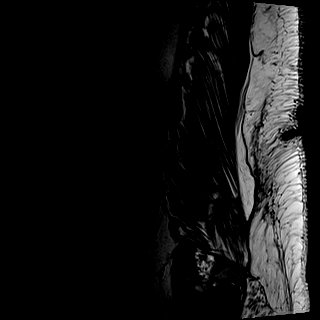
[im 17/17]
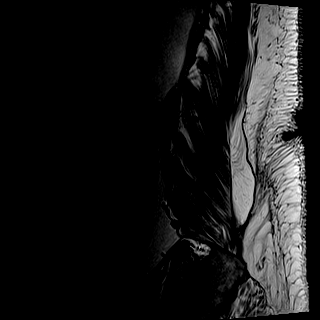

[Series 7: STIR · sagittal · 4.0mm · 0.41mm/px · 6 of 17 slices shown]
[im 1/17]
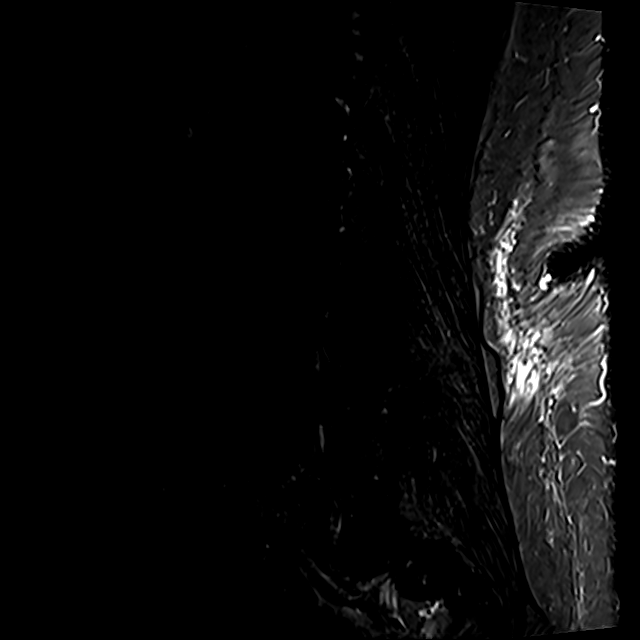
[im 3/17]
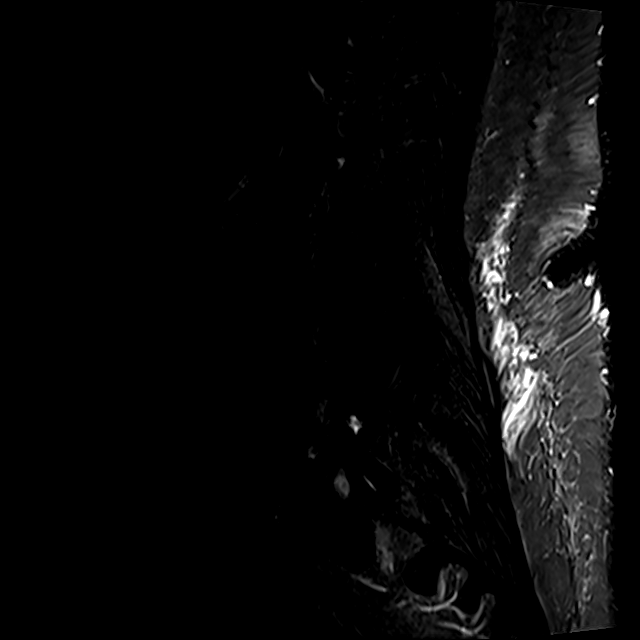
[im 5/17]
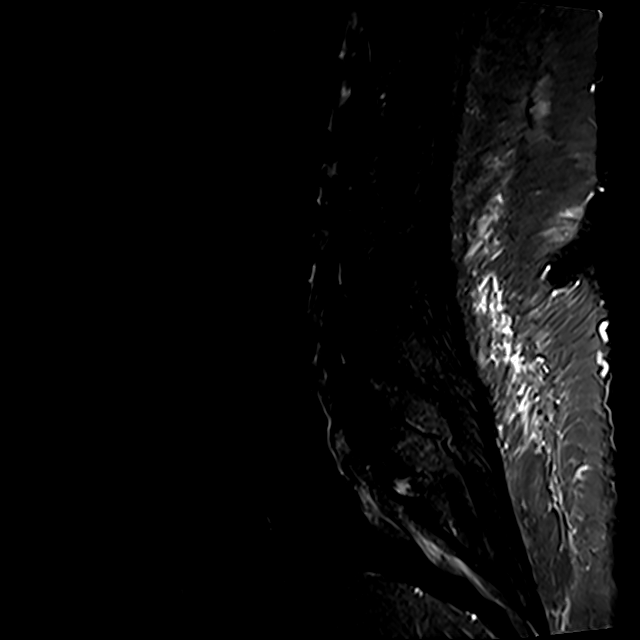
[im 7/17]
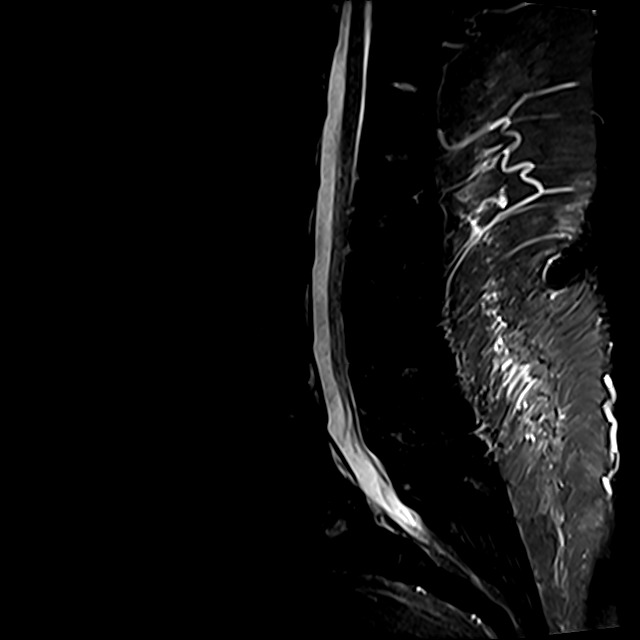
[im 9/17]
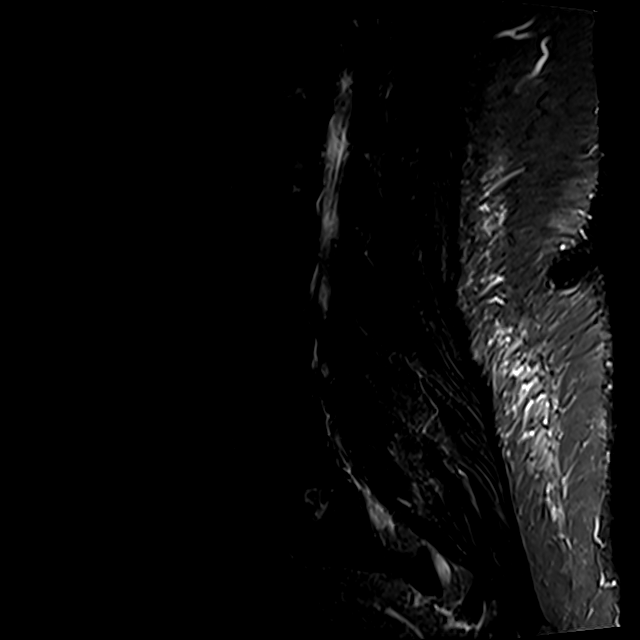
[im 15/17]
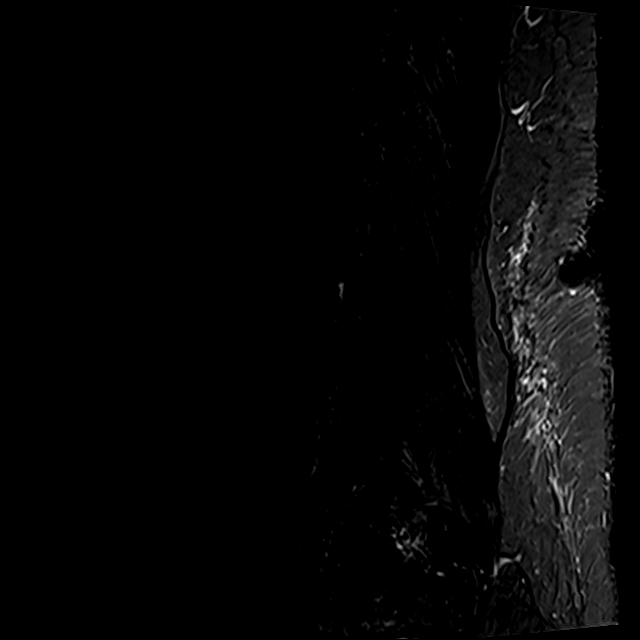

[Series 8: T2 · axial · 4.0mm · 0.78mm/px · z∈[-8,+182]mm · 9 of 37 slices shown (2 of 2)]
[im 2/37]
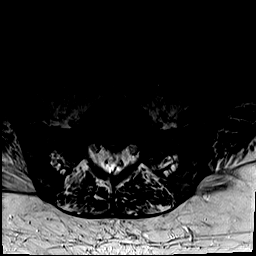
[im 6/37]
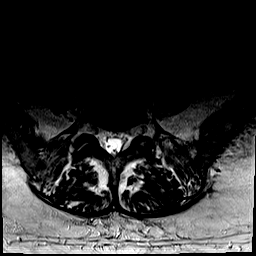
[im 12/37]
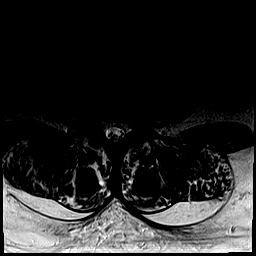
[im 16/37]
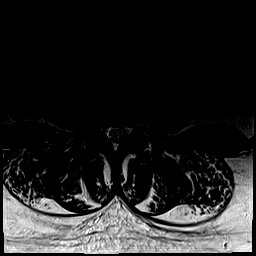
[im 19/37]
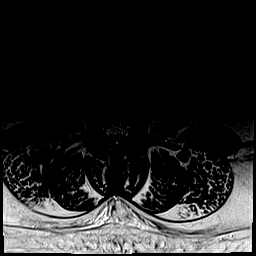
[im 21/37]
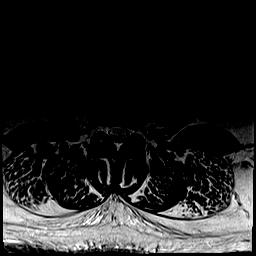
[im 25/37]
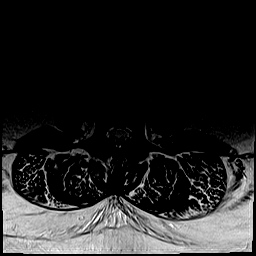
[im 31/37]
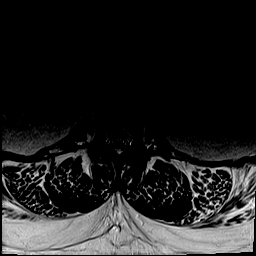
[im 35/37]
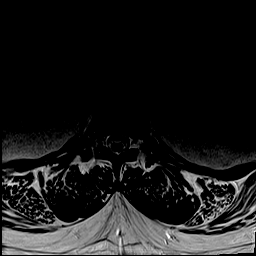

[32 of 48 positions shown; findings below may reference images not displayed]

FINDINGS: Segmentation:  Standard.

Alignment:  Physiologic.

Vertebrae:  No fracture, evidence of discitis, or bone lesion.

Conus medullaris and cauda equina: Conus extends to the L1 level.
Conus and cauda equina appear normal.

Paraspinal and other soft tissues: No acute paraspinal abnormality.

Other: Mild osteoarthritis of bilateral SI joints.

Disc levels:

Disc spaces: Degenerative disease with disc height loss at L5-S1.
Schmorl's node along the inferior endplate of L5 with surrounding
marrow edema. Small areas of T2 hyperintensity along the inferior
endplates of T12, L1, L2 and L3 which may reflect developing
Schmorl's nodes.

T12-L1: No significant disc bulge. No evidence of neural foraminal
stenosis. No central canal stenosis.

L1-L2: No significant disc bulge. No evidence of neural foraminal
stenosis. No central canal stenosis.

L2-L3: No significant disc bulge. No evidence of neural foraminal
stenosis. No central canal stenosis.

L3-L4: Mild broad-based disc bulge. No evidence of neural foraminal
stenosis. No central canal stenosis.

L4-L5: Mild broad-based disc bulge. Mild bilateral facet
arthropathy. No evidence of neural foraminal stenosis. No central
canal stenosis.

L5-S1: Mild broad-based disc bulge. No evidence of neural foraminal
stenosis. No central canal stenosis.
IMPRESSION: 1. At L4-5 there is a mild broad-based disc bulge. Mild bilateral
facet arthropathy.
2. At L5-S1 there is a mild broad-based disc bulge.
3. Schmorl's node along the inferior endplate of L5 with surrounding
marrow edema. Small areas of T2 hyperintensity along the inferior
endplates of T12, L1, L2 and L3 which may reflect developing
Schmorl's nodes.

## 2019-09-10 IMAGING — MR MR SACRUM / SI JOINTS WO CM
4 of 5 series · 24 of 48 positions shown · non-contrast
Comparison: None.

CLINICAL DATA: Low back pain, thigh pain with occasional tingling

EXAM:
MRI SACROILIAC JOINTS WITHOUT CONTRAST
TECHNIQUE: Multiplanar, multisequence MR imaging of the sacroiliac joint was
performed. No intravenous contrast was administered.

[Series 5: T2 fat-sat · sagittal · 4.0mm · 0.81mm/px · 9 of 25 slices shown]
[im 1/25]
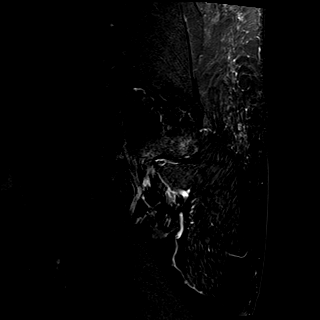
[im 4/25]
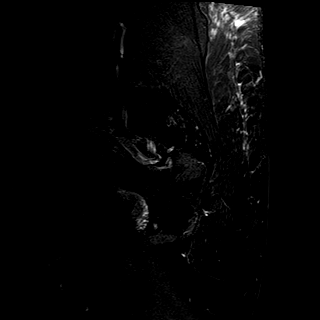
[im 7/25]
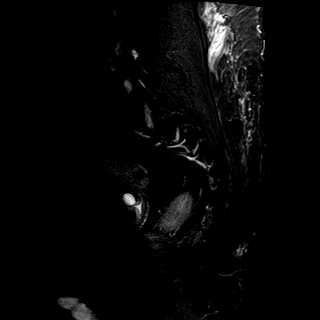
[im 10/25]
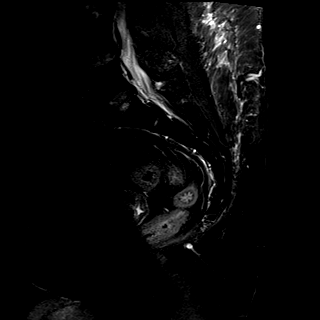
[im 13/25]
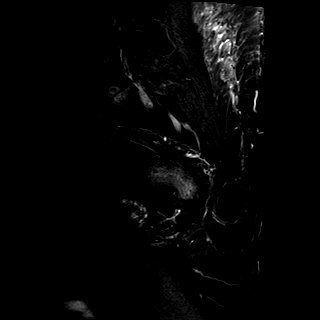
[im 16/25]
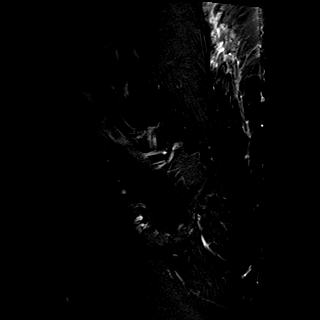
[im 19/25]
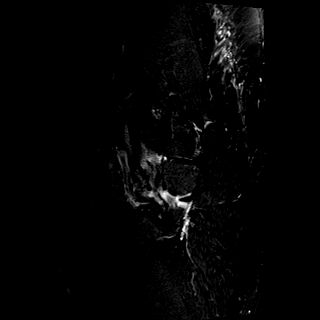
[im 22/25]
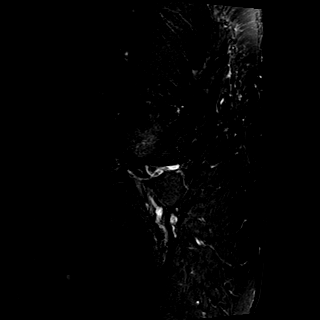
[im 25/25]
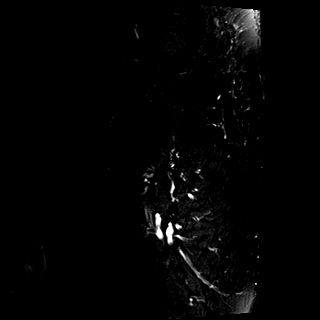

[Series 6: STIR · coronal · 3.0mm · 0.51mm/px · 9 of 25 slices shown]
[im 1/25]
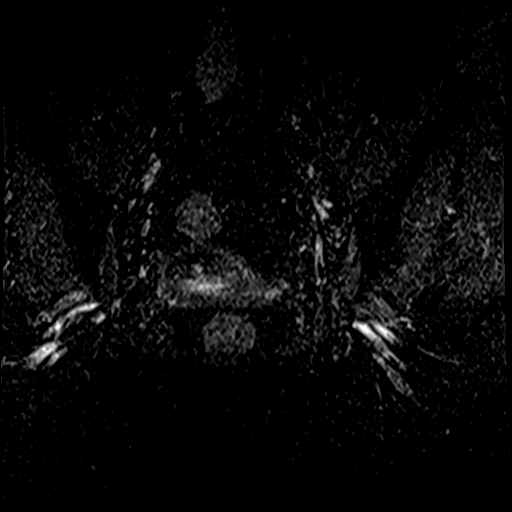
[im 4/25]
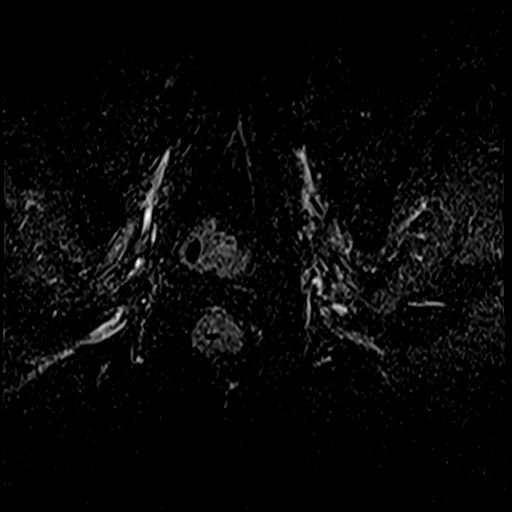
[im 7/25]
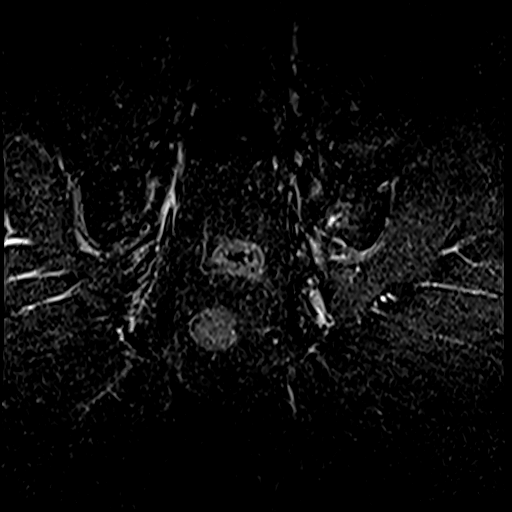
[im 10/25]
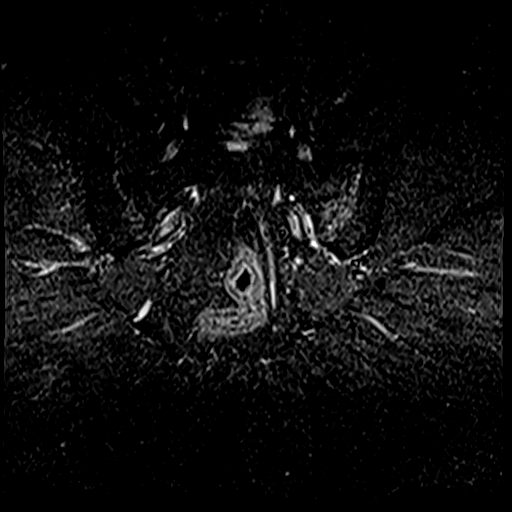
[im 13/25]
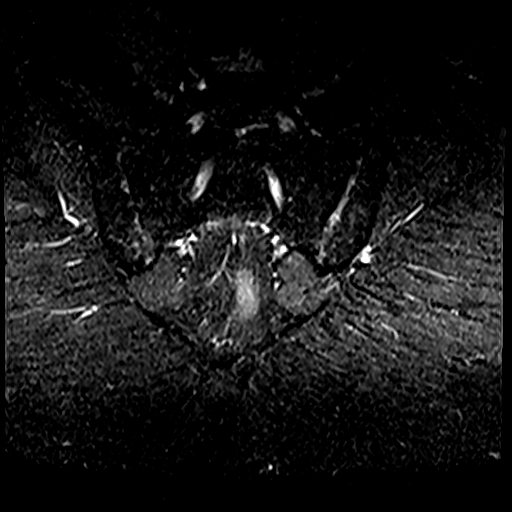
[im 16/25]
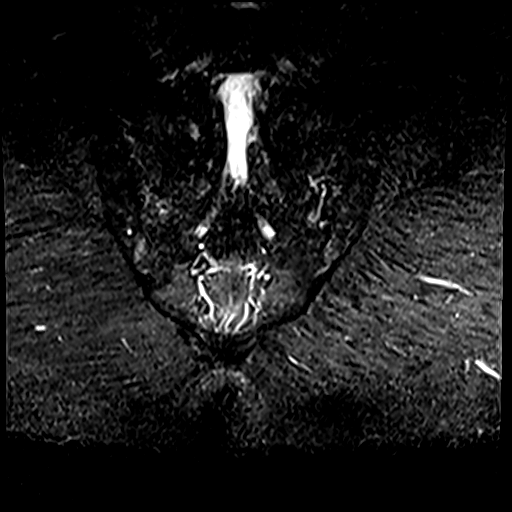
[im 19/25]
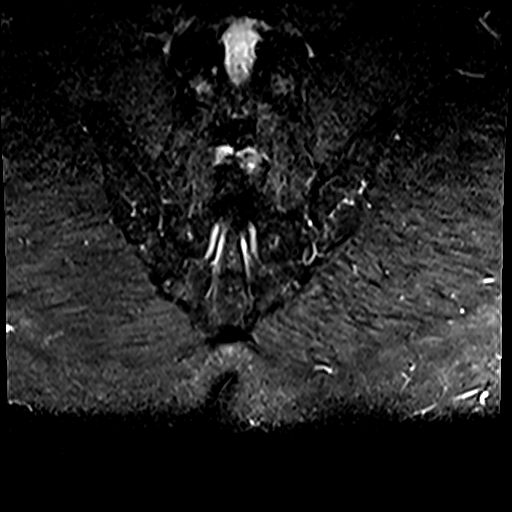
[im 22/25]
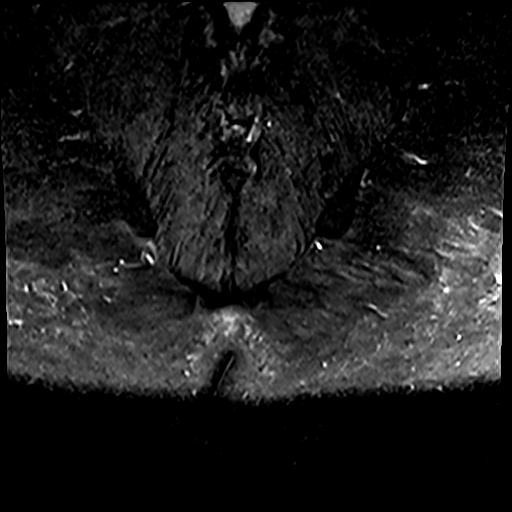
[im 25/25]
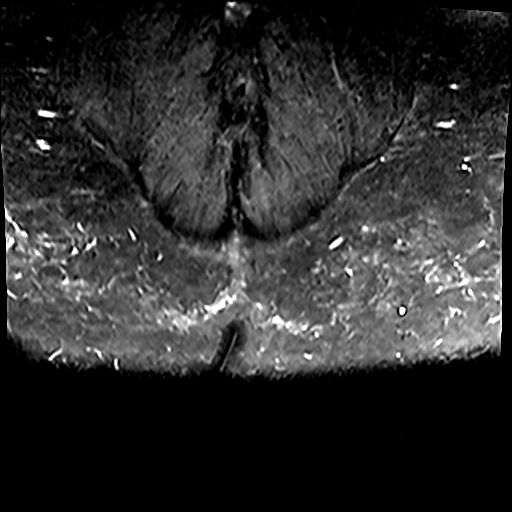

[Series 7: T1 · coronal · 3.0mm · 0.45mm/px · 3 of 25 slices shown (1 of 2)]
[im 3/25]
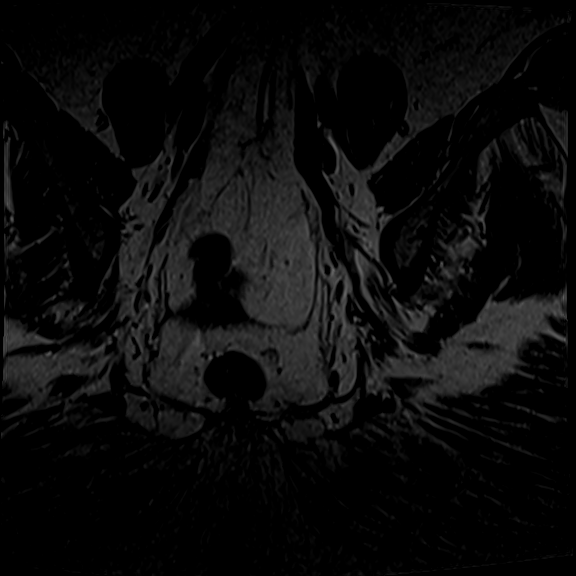
[im 14/25]
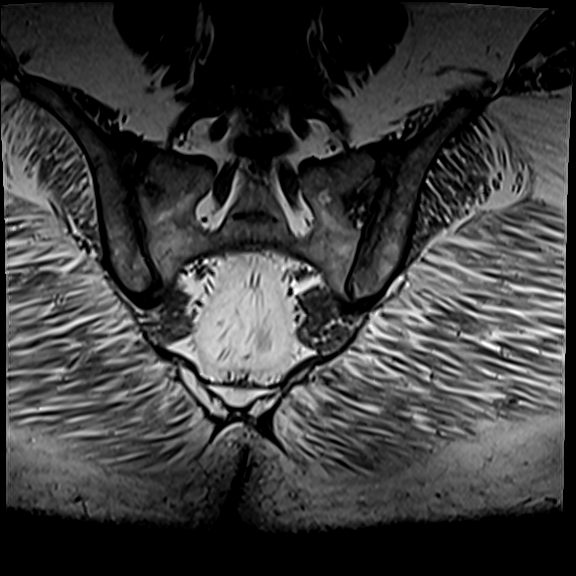
[im 22/25]
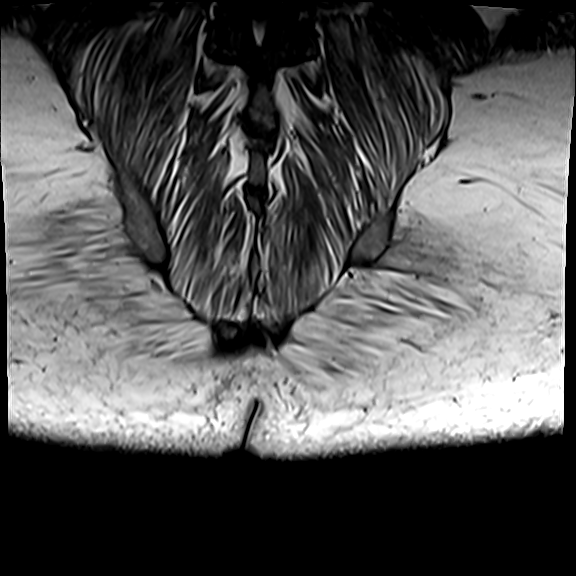

[Series 8: T1 · axial · 4.0mm · 0.43mm/px · z∈[-95,-4]mm · 3 of 25 slices shown (2 of 2)]
[im 3/25]
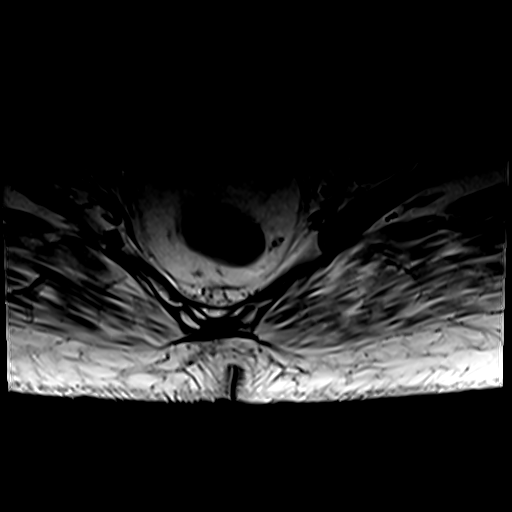
[im 14/25]
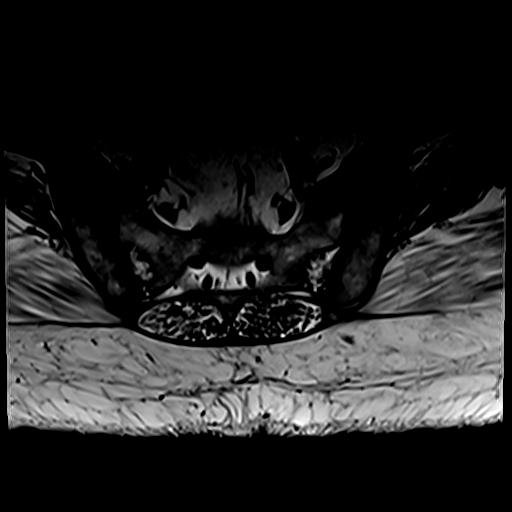
[im 22/25]
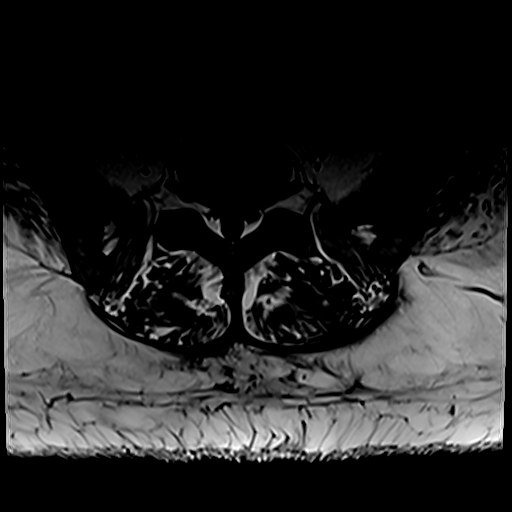

[24 of 48 positions shown; findings below may reference images not displayed]

FINDINGS: Bones/Joint/Cartilage

No marrow signal abnormality. No fracture or dislocation. Normal
alignment. No joint effusion. Mild osteoarthritis of bilateral SI
joints.

Degenerative disease with disc height loss at L5-S1 with a mild
broad-based disc bulge.

Muscles and Tendons
No focal muscle abnormality. No intramuscular fluid collection or
hematoma.

Soft tissue
No fluid collection or hematoma.  No soft tissue mass.
IMPRESSION: 1.  No acute osseous injury of the sacrum and coccyx.
2. Mild osteoarthritis of bilateral SI joints.

## 2019-09-24 ENCOUNTER — Ambulatory Visit: Payer: Medicare HMO | Admitting: Cardiovascular Disease

## 2019-10-08 ENCOUNTER — Telehealth: Payer: Self-pay | Admitting: Pulmonary Disease

## 2019-10-08 NOTE — Telephone Encounter (Signed)
Appt notes have been updated to state phone visit. Patient is aware and voiced her understanding.  Nothing further is needed.

## 2019-10-08 NOTE — Telephone Encounter (Signed)
Patient stated that her breathing has improved since last OV.  Patient is requesting that 10/13/2019 visit be switched to phone visit due to improvement.   Dr. Patsey Berthold, please advise. Thanks

## 2019-10-08 NOTE — Telephone Encounter (Signed)
OK 

## 2019-10-13 ENCOUNTER — Encounter: Payer: Self-pay | Admitting: Pulmonary Disease

## 2019-10-13 ENCOUNTER — Ambulatory Visit (INDEPENDENT_AMBULATORY_CARE_PROVIDER_SITE_OTHER): Payer: Medicare HMO | Admitting: Pulmonary Disease

## 2019-10-13 ENCOUNTER — Other Ambulatory Visit: Payer: Self-pay

## 2019-10-13 DIAGNOSIS — Z992 Dependence on renal dialysis: Secondary | ICD-10-CM

## 2019-10-13 DIAGNOSIS — J9612 Chronic respiratory failure with hypercapnia: Secondary | ICD-10-CM

## 2019-10-13 DIAGNOSIS — J4550 Severe persistent asthma, uncomplicated: Secondary | ICD-10-CM | POA: Diagnosis not present

## 2019-10-13 DIAGNOSIS — J9611 Chronic respiratory failure with hypoxia: Secondary | ICD-10-CM

## 2019-10-13 DIAGNOSIS — N186 End stage renal disease: Secondary | ICD-10-CM

## 2019-10-13 DIAGNOSIS — E662 Morbid (severe) obesity with alveolar hypoventilation: Secondary | ICD-10-CM

## 2019-10-13 MED ORDER — MONTELUKAST SODIUM 10 MG PO TABS: 10.0000 mg | ORAL_TABLET | Freq: Every day | ORAL | 2 refills | Status: DC

## 2019-10-13 NOTE — Patient Instructions (Signed)
Continue Trelegy as you are doing  We will start Singulair 10 mg daily  Continue as needed albuterol  We will see you in follow-up in 3 months time we will schedule chest x-ray at that time (you will be seen in visit first before the x-ray is scheduled).

## 2019-10-13 NOTE — Progress Notes (Signed)
Subjective:    Patient ID: [name], female    DOB: 10/19/1970, 49 y.o.   MRN: [id number] Virtual Visit Via Video or Telephone Note:   This visit type was conducted due to national recommendations for restrictions regarding the COVID-19 pandemic .  This format is felt to be most appropriate for this patient at this time.  All issues noted in this document were discussed and addressed.  No physical exam was performed (except for noted visual exam findings with Video Visits).    I connected with [name] by  telephone at 09:15 AM and verified that I was speaking with the correct person using two identifiers. Location patient: home Location provider: [facility] Persons participating in the virtual visit: patient, physician   I discussed the limitations, risks, security and privacy concerns of performing an evaluation and management service by telephone and the availability of in person appointments. The patient expressed understanding and agreed to proceed.  HPI [name] 49 year old lifelong never smoker with end-stage renal disease, morbid obesity and severe persistent asthma who follows for the same.  Due to the COVID-19 delta surge, she requested visit by phone today.  Patient has chronic respiratory failure with hypercapnia and hypoxia and is chronically on oxygen at 3 L/min with 4 L/min during hemodialysis.  Her oxygen requirements have not changed.  Initial visit with me was on 01 September 2019.  She was placed on Trelegy Ellipta 200/62.5/25, 1 inhalation daily.  Notes that this has helped her tremendously.  She still has to use her rescue inhaler several times per day but overall feels that her breathing has improved.  Continues to have issues with nasal congestion which aggravates her respiratory status.  She is compliant with dialysis.  She is compliant with her oxygen supplementation.  She voices no other complaint today.  Overall she feels that she is doing  better.  Review of Systems A 10 point review of systems was performed and it is as noted above otherwise negative.  Allergies  Allergen Reactions  . Dust Mite Extract Shortness Of Breath and Itching  . Eggs Or Egg-Derived Products Itching    MAKES TONGUE ITCH but she will eat, only in small amounts  . Mold Extract [Trichophyton] Shortness Of Breath and Itching  . Pollen Extract Shortness Of Breath    Itching, shortness of breath, asthma like symptoms  . Sulfa Antibiotics Itching  . Feraheme [Ferumoxytol] Itching    Uses benadryl so she can get the medicine   Immunization History  Administered Date(s) Administered  . Influenza,inj,Quad PF,6+ Mos 11/14/2016  . Influenza-Unspecified 10/16/2018, 11/05/2018  . PPD Test 04/15/2017   We discussed Covid-19 precautions.  I reviewed the vaccine effectiveness and potential side effects in detail to include differences between mRNA vaccines and traditional vaccines (attenuated virus).  Discussion also offered of long-term effectiveness and safety profile which are unclear at this time.  Discussed current CDC guidance that all patients are recommended COVID-19 vaccinations [when available]-as long as they do not have allergy to components of the vaccine.  Patient declines COVID-19 vaccine.  Note patient has had COVID-19 infection January 2021.    Objective:   Physical Exam  No physical exam performed as the visit was via telephone.  Patient did not exhibit conversational dyspnea during the visit.    Assessment & Plan:     ICD-10-CM   1. Severe persistent asthma without complication  J85.63    Continue Trelegy Ellipta 200/62.5/25, 1 inhalation daily Continue albuterol  as needed Add Singulair Follow-up 3 months time  2. Chronic respiratory failure with hypoxia and hypercapnia (HCC)  J96.11    J96.12    Patient compliant with oxygen at 3 L/min, 4 L with dialysis Continue use of oxygen   3. Extreme obesity with alveolar hypoventilation  (HCC)  E66.2    This issue adds complexity to her management Makes asthma more difficult to manage Weight loss recommended  4. ESRD (end stage renal disease) on dialysis [facility])  N18.6    Z99.2    This issue adds complexity to her management Continue dialysis as per renal   Meds ordered this encounter  Medications  . montelukast (SINGULAIR) 10 MG tablet    Sig: Take 1 tablet (10 mg total) by mouth daily.    Dispense:  30 tablet    Refill:  2    Discussion:  Patient will be seen in follow-up in 3 months time.  We will cue her for chest x-ray on follow-up visit.  We will also reconsider allergy reevaluation at that time.   Total visit time: 15 minutes.   Renold Don, MD [facility]   *This note was dictated using voice recognition software/Dragon.  Despite best efforts to proofread, errors can occur which can change the meaning.  Any change was purely unintentional.

## 2019-10-14 NOTE — Progress Notes (Signed)
Cardiology Office Note  Date:  10/15/2019   ID:  CORVETTE ORSER, DOB 04-06-1970, MRN [id number]  PCP:  [name], NP   Chief Complaint  Patient presents with  . Other    Patient c/o passing out spells / SOB and chest pain. meds reviewed verbally with patient.     HPI:  49 yo morbidly obese woman with uncontrolled diabetes  with history of chronic diastolic CHF,  anemia,  Asthma Morbid obesity ESRD on HD 3w a week, started 03/2017  who presents for  follow-up of her diastolic CHF.  Last seen in clinic March 2021 Covid 02/2019,   Weight down 10 pounds, On ozympic, Sugars "good"  Has back pain, "has bulging disk" "something for anxiety" CBD oil works, "edibles with [employer]"  Diabetes followed by PMD  On chronic oxygen, 3 liters, followed by gonzalez  Anemia followed by nephrology Iron infusion at HD  Takes torsemide 100 BID on non HD days  HD on tues/Thurs/sat On midodrine PRN before HD Out of a month, drops pressure 3 x  EKG personally reviewed by myself on todays visit Shows NSR rate 104 BPM   PMH:   has a past medical history of (HFpEF) heart failure with preserved ejection fraction ([place]), Anxiety, Asthma, Bronchitis, CHF (congestive heart failure) ([place]), Chronic cough, Chronic respiratory failure with hypoxia and hypercapnia ([place]), CKD (chronic kidney disease), stage III, Depression, Diabetes mellitus without complication ([place]), Diverticulosis, Dyspnea, Dysrhythmia, Environmental and seasonal allergies, Extreme obesity with alveolar hypoventilation ([place]), Hypertension, Iron deficiency anemia, Kidney failure, Orthopnea, Oxygen dependent, Pericardial effusion, Pneumonia, Poorly controlled diabetes mellitus ([place]), and Swelling of both lower extremities.  PSH:    Past Surgical History:  Procedure Laterality Date  . A/V FISTULAGRAM Left 06/09/2017   Procedure: A/V FISTULAGRAM;  Surgeon: Algernon Huxley, MD;  Location: [facility];  Service: Cardiovascular;   Laterality: Left;  . A/V FISTULAGRAM Left 06/16/2017   Procedure: A/V FISTULAGRAM;  Surgeon: Algernon Huxley, MD;  Location: [facility];  Service: Cardiovascular;  Laterality: Left;  . A/V FISTULAGRAM Left 01/28/2018   Procedure: A/V FISTULAGRAM;  Surgeon: Algernon Huxley, MD;  Location: [facility];  Service: Cardiovascular;  Laterality: Left;  . AV FISTULA PLACEMENT Left 04/25/2017   Procedure: ARTERIOVENOUS (AV) FISTULA CREATION;  Surgeon: Algernon Huxley, MD;  Location: [facility];  Service: Vascular;  Laterality: Left;  . CATARACT EXTRACTION W/PHACO Left 01/21/2018   Procedure: CATARACT EXTRACTION PHACO AND INTRAOCULAR LENS PLACEMENT (IOC);  Surgeon: Leandrew Koyanagi, MD;  Location: [facility];  Service: Ophthalmology;  Laterality: Left;  [place] 01:55.5 CDE 9.05 EAUP 13.9 Fluid Pack Lot # [id number]  . CESAREAN SECTION     times 3  . DIALYSIS/PERMA CATHETER INSERTION Right 04/04/2017   Procedure: DIALYSIS/PERMA CATHETER INSERTION;  Surgeon: Katha Cabal, MD;  Location: [facility];  Service: Cardiovascular;  Laterality: Right;  . DIALYSIS/PERMA CATHETER REMOVAL N/A 09/17/2017   Procedure: DIALYSIS/PERMA CATHETER REMOVAL;  Surgeon: Algernon Huxley, MD;  Location: [facility];  Service: Cardiovascular;  Laterality: N/A;  . INSERTION OF DIALYSIS CATHETER N/A 04/16/2017   Procedure: INSERTION OF DIALYSIS PERM CATHETER;  Surgeon: Algernon Huxley, MD;  Location: [facility];  Service: Vascular;  Laterality: N/A;    Current Outpatient Medications  Medication Sig Dispense Refill  . acetaminophen (TYLENOL) 500 MG tablet Take 1,000 mg by mouth every 6 (six) hours as needed for mild pain, fever or headache.    [name]  albuterol (PROVENTIL HFA;VENTOLIN HFA) 108 (90 Base) MCG/ACT inhaler Inhale 2 puffs into the lungs every 6 (six) hours as needed for wheezing or shortness of breath. (Patient taking differently: Inhale 2 puffs into the lungs every 4 (four) hours as needed for wheezing or  shortness of breath. ) 1 Inhaler 3  . albuterol (PROVENTIL) (2.5 MG/3ML) 0.083% nebulizer solution Take 2.5 mg by nebulization every 6 (six) hours as needed for wheezing or shortness of breath.    [name] aspirin EC 81 MG tablet Take 81 mg by mouth daily.     [name] atorvastatin (LIPITOR) 80 MG tablet Take 80 mg by mouth daily at 6 PM.    . b complex-vitamin c-folic acid (NEPHRO-VITE) 0.8 MG TABS tablet Take 1 tablet by mouth at bedtime.    . Cholecalciferol (HM VITAMIN D3) 4000 units CAPS Take 1 capsule by mouth once a week.    . cloNIDine (CATAPRES) 0.2 MG tablet Take 1 tablet (0.2 mg total) by mouth 2 (two) times daily. (Patient taking differently: Take 0.2 mg by mouth 2 (two) times daily. ) 30 tablet 0  . docusate sodium (COLACE) 100 MG capsule Take 100 mg by mouth daily.    [name] ELDERBERRY PO Take by mouth daily.    . ferric citrate (AURYXIA) 1 GM 210 MG(Fe) tablet Take 210 mg by mouth 3 (three) times daily with meals.    . Fluticasone-Umeclidin-Vilant (TRELEGY ELLIPTA) 200-62.5-25 MCG/INH AEPB Inhale 1 puff into the lungs daily. 60 each 2  . insulin aspart (NOVOLOG) 100 UNIT/ML injection Inject 0-5 Units into the skin at bedtime. 10 mL 11  . insulin aspart (NOVOLOG) 100 UNIT/ML injection Inject 0-15 Units into the skin 3 (three) times daily with meals. 10 mL 11  . insulin degludec (TRESIBA) 100 UNIT/ML SOPN FlexTouch Pen Inject 36 Units into the skin daily at 10 pm.     . ipratropium-albuterol (DUONEB) 0.5-2.5 (3) MG/3ML SOLN Take 3 mLs by nebulization every 6 (six) hours as needed. 360 mL 5  . lactulose (CHRONULAC) 10 GM/15ML solution Take 30 mLs (20 g total) by mouth daily as needed for severe constipation. 240 mL 0  . lidocaine-prilocaine (EMLA) cream Apply topically as needed (For access for dialysis).    [name] losartan (COZAAR) 50 MG tablet Take 50 mg by mouth 2 (two) times daily. On dialysis days, patient is taking in the evening    . metoprolol tartrate (LOPRESSOR) 100 MG tablet Take 1 tablet (100 mg  total) by mouth 2 (two) times daily. 60 tablet 0  . montelukast (SINGULAIR) 10 MG tablet Take 1 tablet (10 mg total) by mouth daily. 30 tablet 2  . OXYGEN Inhale 2-4 L into the lungs.    [name] OZEMPIC, 0.25 OR 0.5 MG/DOSE, 2 MG/1.5ML SOPN     . polyethylene glycol (MIRALAX / GLYCOLAX) packet Take 17 g by mouth daily as needed for mild constipation. 14 each 0  . torsemide (DEMADEX) 100 MG tablet Take 1 tablet (100 mg total) by mouth 2 (two) times daily. 180 tablet 0   Current Facility-Administered Medications  Medication Dose Route Frequency Provider Last Rate Last Admin  . mometasone-formoterol (DULERA) 200-5 MCG/ACT inhaler 2 puff  2 puff Inhalation BID Magdalen Spatz, NP         Allergies:   Dust mite extract, Eggs or egg-derived products, Mold extract [trichophyton], Pollen extract, Sulfa antibiotics, and Feraheme [ferumoxytol]   Social History:  The patient  reports that she has never smoked. She has never used smokeless  tobacco. She reports current alcohol use. She reports current drug use. Drug: Marijuana.   Family History:   family history includes Breast cancer (age of onset: 26) in her maternal aunt and maternal aunt; Diabetes in her father, mother, and another family member; Hypertension in her father, mother, and another family member.    Review of Systems: Review of Systems  Constitutional: Negative.   HENT: Negative.   Respiratory: Positive for shortness of breath.   Cardiovascular: Negative.   Gastrointestinal: Negative.   Musculoskeletal: Positive for back pain.  Neurological: Negative.   Psychiatric/Behavioral: Negative.   All other systems reviewed and are negative.   PHYSICAL EXAM: VS:  BP 120/82 (BP Location: Right Wrist, Patient Position: Sitting, Cuff Size: Large)   Pulse (!) 104   Ht 5' 4.5" (1.638 m)   Wt 297 lb (134.7 kg)   LMP 09/06/2016 (Approximate)   BMI 50.19 kg/m  , BMI Body mass index is 50.19 kg/m. Constitutional:  oriented to person, place, and  time. No distress.  HENT:  Head: Grossly normal Eyes:  no discharge. No scleral icterus.  Neck: No JVD, no carotid bruits  Cardiovascular: Regular rate and rhythm, no murmurs appreciated Pulmonary/Chest: Clear to auscultation bilaterally, no wheezes or rails Abdominal: Soft.  no distension.  no tenderness.  Musculoskeletal: Normal range of motion Neurological:  normal muscle tone. Coordination normal. No atrophy Skin: Skin warm and dry Psychiatric: normal affect, pleasant   Recent Labs: No results found for requested labs within last 8760 hours.    Lipid Panel Lab Results  Component Value Date   CHOL 232 (H) 01/03/2015   HDL 41 01/03/2015   LDLCALC 169 (H) 01/03/2015   TRIG 112 01/03/2015      Wt Readings from Last 3 Encounters:  10/15/19 297 lb (134.7 kg)  09/01/19 292 lb (132.5 kg)  05/04/19 (!) 306 lb (138.8 kg)    ASSESSMENT AND PLAN:  Chronic diastolic congestive heart failure (HCC) - Fluid managed by hemodialysis Appears to be only euvolemic or dry side Orthostatics checked today, mild drop with sitting but recovered with standing Takes midodrine as needed  Essential hypertension -  Blood pressure is well controlled on today's visit. No changes made to the medications. Discussed when to take midodrine, went to hold her losartan for hypotension  Asthma, unspecified asthma severity, unspecified whether complicated, unspecified whether persistent - Prior Covid infection Discussed need for vaccine, she would be high risk of being put on a ventilator She and her husband who presents with her today are adamant, did not want vaccine despite significant time spent discussing her risk  Morbid obesity due to excess calories ([place]) History of poor diet Weight down 10 pounds since early 2021, she is watching her diet Recommend water aerobics  Poorly controlled type 2 diabetes mellitus ([place]) Hemoglobin A1c chronically elevated Stressed importance of aggressive diet  control  Shortness of breath Recommend water aerobics, walking program, dietary changes  Anemia Managed by nephrology   Total encounter time more than 25 minutes  Greater than 50% was spent in counseling and coordination of care with the patient   Disposition:   F/U  12 months   Orders Placed This Encounter  Procedures  . EKG 12-Lead     Signed, Esmond Plants, M.D., Ph.D. 10/15/2019  [place], [place]

## 2019-10-15 ENCOUNTER — Other Ambulatory Visit: Payer: Self-pay

## 2019-10-15 ENCOUNTER — Encounter: Payer: Self-pay | Admitting: Cardiovascular Disease

## 2019-10-15 ENCOUNTER — Ambulatory Visit (INDEPENDENT_AMBULATORY_CARE_PROVIDER_SITE_OTHER): Payer: Medicare HMO | Admitting: Cardiovascular Disease

## 2019-10-15 VITALS — BP 120/82 | HR 104 | Ht 64.5 in | Wt 297.0 lb

## 2019-10-15 DIAGNOSIS — I10 Essential (primary) hypertension: Secondary | ICD-10-CM

## 2019-10-15 DIAGNOSIS — I110 Hypertensive heart disease with heart failure: Secondary | ICD-10-CM | POA: Diagnosis not present

## 2019-10-15 DIAGNOSIS — I5032 Chronic diastolic (congestive) heart failure: Secondary | ICD-10-CM | POA: Diagnosis not present

## 2019-10-15 DIAGNOSIS — I5033 Acute on chronic diastolic (congestive) heart failure: Secondary | ICD-10-CM | POA: Diagnosis not present

## 2019-10-15 DIAGNOSIS — N186 End stage renal disease: Secondary | ICD-10-CM | POA: Diagnosis not present

## 2019-10-15 DIAGNOSIS — Z794 Long term (current) use of insulin: Secondary | ICD-10-CM

## 2019-10-15 DIAGNOSIS — E1122 Type 2 diabetes mellitus with diabetic chronic kidney disease: Secondary | ICD-10-CM

## 2019-10-15 DIAGNOSIS — Z992 Dependence on renal dialysis: Secondary | ICD-10-CM

## 2019-10-15 NOTE — Patient Instructions (Signed)
Medication Instructions:  No changes  If you need a refill on your cardiac medications before your next appointment, please call your pharmacy.    Lab work: No new labs needed   If you have labs (blood work) drawn today and your tests are completely normal, you will receive your results only by: . MyChart Message (if you have MyChart) OR . A paper copy in the mail If you have any lab test that is abnormal or we need to change your treatment, we will call you to review the results.   Testing/Procedures: No new testing needed   Follow-Up: At [facility], you and your health needs are our priority.  As part of our continuing mission to provide you with exceptional heart care, we have created designated Provider Care Teams.  These Care Teams include your primary Cardiologist (physician) and Advanced Practice Providers (APPs -  Physician Assistants and Nurse Practitioners) who all work together to provide you with the care you need, when you need it.  . You will need a follow up appointment in 12 months  . Providers on your designated Care Team:   . Christopher Berge, NP . Ryan Dunn, PA-C . Jacquelyn Visser, PA-C  Any Other Special Instructions Will Be Listed Below (If Applicable).  COVID-19 Vaccine Information can be found at: [email or web address] For questions related to vaccine distribution or appointments, please email [email or web address] or call [phone].     

## 2019-11-23 ENCOUNTER — Other Ambulatory Visit: Payer: Self-pay | Admitting: Pulmonary Disease

## 2019-12-30 ENCOUNTER — Other Ambulatory Visit: Payer: Self-pay

## 2019-12-30 MED ORDER — ALBUTEROL SULFATE HFA 108 (90 BASE) MCG/ACT IN AERS: 2.0000 | INHALATION_SPRAY | RESPIRATORY_TRACT | 2 refills | Status: DC | PRN

## 2020-01-28 ENCOUNTER — Other Ambulatory Visit: Payer: Self-pay | Admitting: Pulmonary Disease

## 2020-03-10 ENCOUNTER — Telehealth: Payer: Self-pay | Admitting: Pulmonary Disease

## 2020-03-10 DIAGNOSIS — J4550 Severe persistent asthma, uncomplicated: Secondary | ICD-10-CM

## 2020-03-10 NOTE — Telephone Encounter (Signed)
Spoke to patient, who stated that [facility] does not have any backup oxygen tanks.  Patient stated that [facility] advised her to call EMS or go to ED if she runs out of oxygen.  She is also requesting Rx for nebulizer machine. It does not appear that our office prescribed a nebulizer machine previously.   Rodena Piety contacted [facility], who confirmed that they do not currently have backup tanks. Per [facility], patient received a backup tank when setup on oxygen originally.  Called patient back and relayed message from [place]. Patient stated that she did receive a backup tank, however it is almost empty. She stated that she contacted her insurance and was advised that can get oxygen from multiple companies.    Rodena Piety contacted [place] with [employer], who stated that patient recently switched from [employer] to [place] 3 years ago. Melissa does not think that insurance will allow patient to switch back, as it has not been 5 years. Melissa suggested that patient stay with a family member who has power or go to a shelter.     Patient has been made aware of [employer]'s response. She voiced her understanding and had no further questions.   Dr. Joline Salt advise on nebulizer machine.

## 2020-03-11 NOTE — Telephone Encounter (Signed)
Can Rx neb machine

## 2020-03-14 NOTE — Telephone Encounter (Signed)
Order for nebulizer machine has been sent to [place] [facility]. Left detailed message for patient.  Nothing further needed at this time.

## 2020-03-15 NOTE — Telephone Encounter (Signed)
Received message from [facility]- patient is not eligible for nebulizer machine. Patient last received nebulizer machine 03/2018. Patient is aware and voiced her understanding.  Nothing further needed.

## 2020-04-20 ENCOUNTER — Other Ambulatory Visit: Payer: Self-pay | Admitting: Pulmonary Disease

## 2020-05-27 ENCOUNTER — Emergency Department
Admission: EM | Admit: 2020-05-27 | Discharge: 2020-05-27 | Disposition: A | Discharge status: Home / Self Care | Payer: Medicare HMO | Attending: Emergency Medicine | Admitting: Emergency Medicine

## 2020-05-27 ENCOUNTER — Other Ambulatory Visit: Payer: Self-pay

## 2020-05-27 ENCOUNTER — Emergency Department: Payer: Medicare HMO

## 2020-05-27 DIAGNOSIS — Z7982 Long term (current) use of aspirin: Secondary | ICD-10-CM | POA: Insufficient documentation

## 2020-05-27 DIAGNOSIS — R059 Cough, unspecified: Secondary | ICD-10-CM | POA: Insufficient documentation

## 2020-05-27 DIAGNOSIS — Z794 Long term (current) use of insulin: Secondary | ICD-10-CM | POA: Diagnosis not present

## 2020-05-27 DIAGNOSIS — J441 Chronic obstructive pulmonary disease with (acute) exacerbation: Secondary | ICD-10-CM | POA: Insufficient documentation

## 2020-05-27 DIAGNOSIS — K859 Acute pancreatitis without necrosis or infection, unspecified: Secondary | ICD-10-CM | POA: Diagnosis not present

## 2020-05-27 DIAGNOSIS — Z20822 Contact with and (suspected) exposure to covid-19: Secondary | ICD-10-CM | POA: Diagnosis not present

## 2020-05-27 DIAGNOSIS — E1122 Type 2 diabetes mellitus with diabetic chronic kidney disease: Secondary | ICD-10-CM | POA: Diagnosis not present

## 2020-05-27 DIAGNOSIS — Z7951 Long term (current) use of inhaled steroids: Secondary | ICD-10-CM | POA: Diagnosis not present

## 2020-05-27 DIAGNOSIS — Z79899 Other long term (current) drug therapy: Secondary | ICD-10-CM | POA: Diagnosis not present

## 2020-05-27 DIAGNOSIS — I132 Hypertensive heart and chronic kidney disease with heart failure and with stage 5 chronic kidney disease, or end stage renal disease: Secondary | ICD-10-CM | POA: Diagnosis not present

## 2020-05-27 DIAGNOSIS — R1011 Right upper quadrant pain: Secondary | ICD-10-CM | POA: Diagnosis present

## 2020-05-27 DIAGNOSIS — Z992 Dependence on renal dialysis: Secondary | ICD-10-CM | POA: Insufficient documentation

## 2020-05-27 DIAGNOSIS — N186 End stage renal disease: Secondary | ICD-10-CM | POA: Diagnosis not present

## 2020-05-27 DIAGNOSIS — J45909 Unspecified asthma, uncomplicated: Secondary | ICD-10-CM | POA: Diagnosis not present

## 2020-05-27 DIAGNOSIS — R0989 Other specified symptoms and signs involving the circulatory and respiratory systems: Secondary | ICD-10-CM | POA: Diagnosis not present

## 2020-05-27 DIAGNOSIS — R1010 Upper abdominal pain, unspecified: Secondary | ICD-10-CM

## 2020-05-27 DIAGNOSIS — I5033 Acute on chronic diastolic (congestive) heart failure: Secondary | ICD-10-CM | POA: Insufficient documentation

## 2020-05-27 LAB — RESP PANEL BY RT-PCR (FLU A&B, COVID) ARPGX2
Influenza A by PCR: NEGATIVE
Influenza B by PCR: NEGATIVE
SARS Coronavirus 2 by RT PCR: NEGATIVE

## 2020-05-27 LAB — CBC
HCT: 35.7 % — ABNORMAL LOW (ref 36.0–46.0)
Hemoglobin: 11.4 g/dL — ABNORMAL LOW (ref 12.0–15.0)
MCH: 30.7 pg (ref 26.0–34.0)
MCHC: 31.9 g/dL (ref 30.0–36.0)
MCV: 96.2 fL (ref 80.0–100.0)
Platelets: 434 10*3/uL — ABNORMAL HIGH (ref 150–400)
RBC: 3.71 MIL/uL — ABNORMAL LOW (ref 3.87–5.11)
RDW: 13.2 % (ref 11.5–15.5)
WBC: 14.5 10*3/uL — ABNORMAL HIGH (ref 4.0–10.5)
nRBC: 0 % (ref 0.0–0.2)

## 2020-05-27 LAB — COMPREHENSIVE METABOLIC PANEL
ALT: 29 U/L (ref 0–44)
AST: 44 U/L — ABNORMAL HIGH (ref 15–41)
Albumin: 3.7 g/dL (ref 3.5–5.0)
Alkaline Phosphatase: 154 U/L — ABNORMAL HIGH (ref 38–126)
Anion gap: 11 (ref 5–15)
BUN: 42 mg/dL — ABNORMAL HIGH (ref 6–20)
CO2: 24 mmol/L (ref 22–32)
Calcium: 8.4 mg/dL — ABNORMAL LOW (ref 8.9–10.3)
Chloride: 92 mmol/L — ABNORMAL LOW (ref 98–111)
Creatinine, Ser: 8.14 mg/dL — ABNORMAL HIGH (ref 0.44–1.00)
GFR, Estimated: 6 mL/min — ABNORMAL LOW (ref >60)
Glucose, Bld: 272 mg/dL — ABNORMAL HIGH (ref 70–99)
Potassium: 4.9 mmol/L (ref 3.5–5.1)
Sodium: 127 mmol/L — ABNORMAL LOW (ref 135–145)
Total Bilirubin: 1.1 mg/dL (ref 0.3–1.2)
Total Protein: 7.4 g/dL (ref 6.5–8.1)

## 2020-05-27 LAB — LIPASE, BLOOD: Lipase: 86 U/L — ABNORMAL HIGH (ref 11–51)

## 2020-05-27 IMAGING — US US ABDOMEN LIMITED RUQ/ASCITES
1 series · 14 of 25 positions shown · non-contrast
Comparison: None.

CLINICAL DATA: Right upper quadrant pain for 1 week

EXAM:
ULTRASOUND ABDOMEN LIMITED RIGHT UPPER QUADRANT

[Series 1: us abdomen limited ruq (liver/gb) · 14 of 51 slices shown]
[im 1/51]
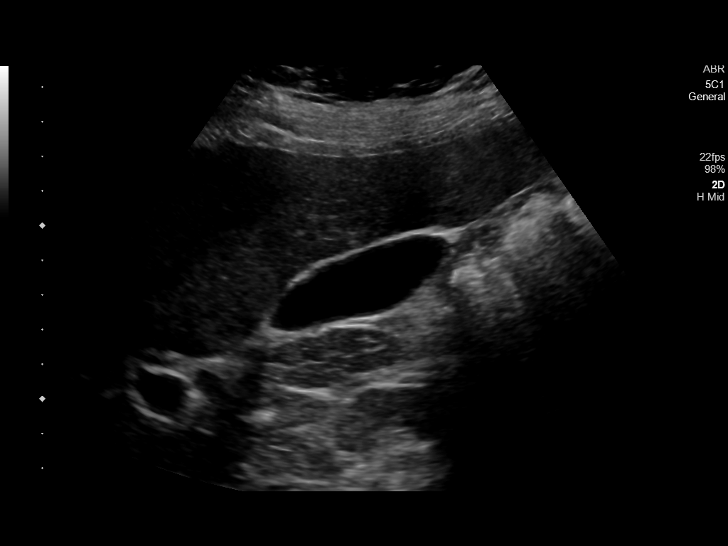
[im 5/51]
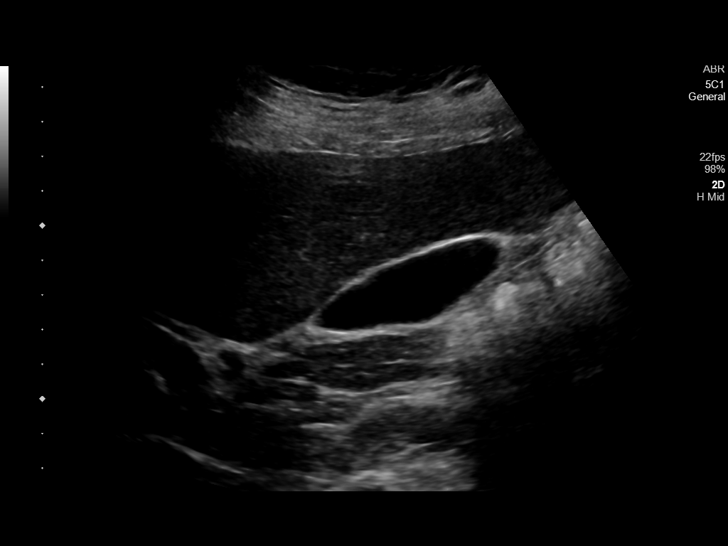
[im 9/51]
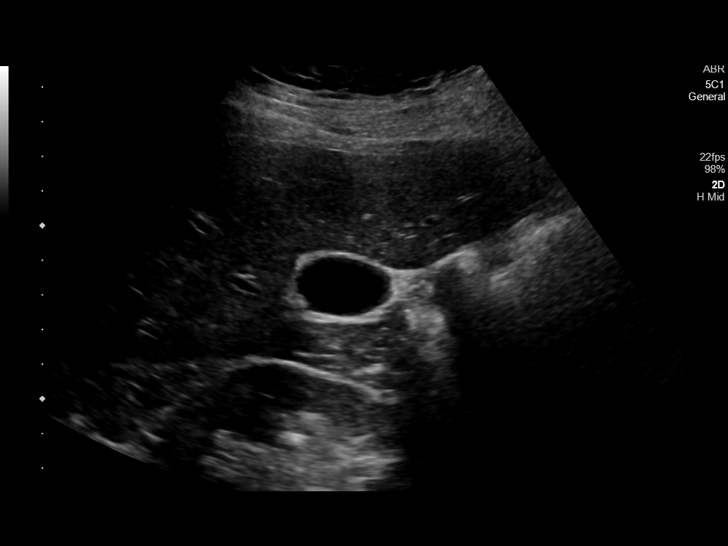
[im 13/51]
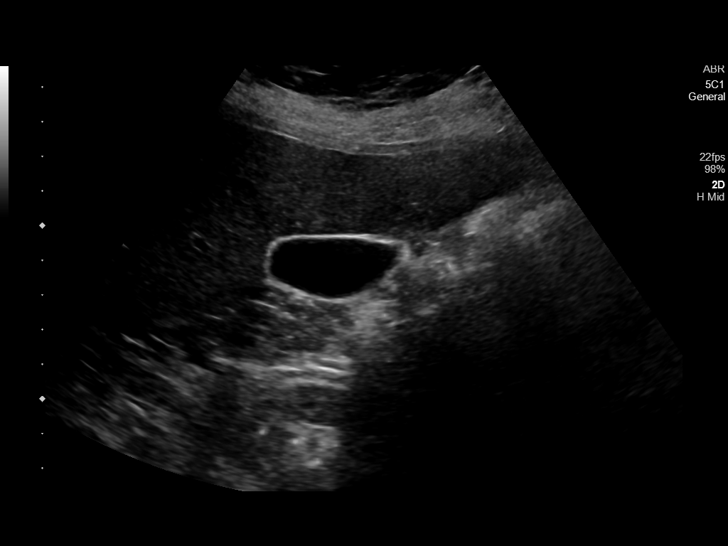
[im 17/51]
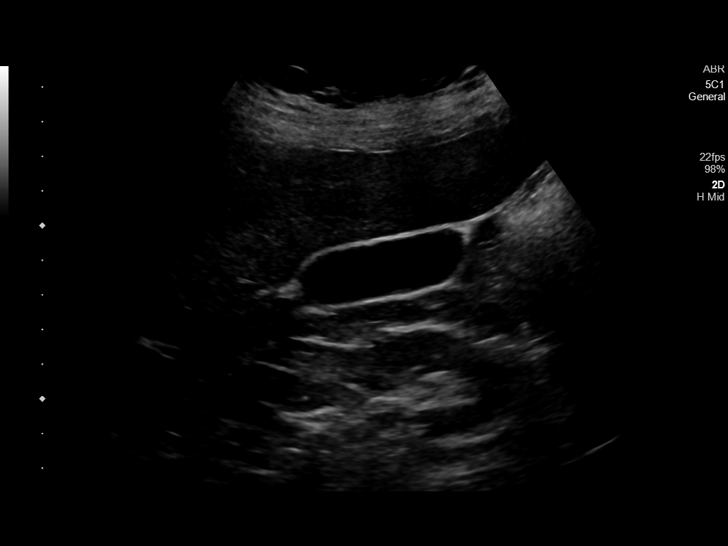
[im 19/51]
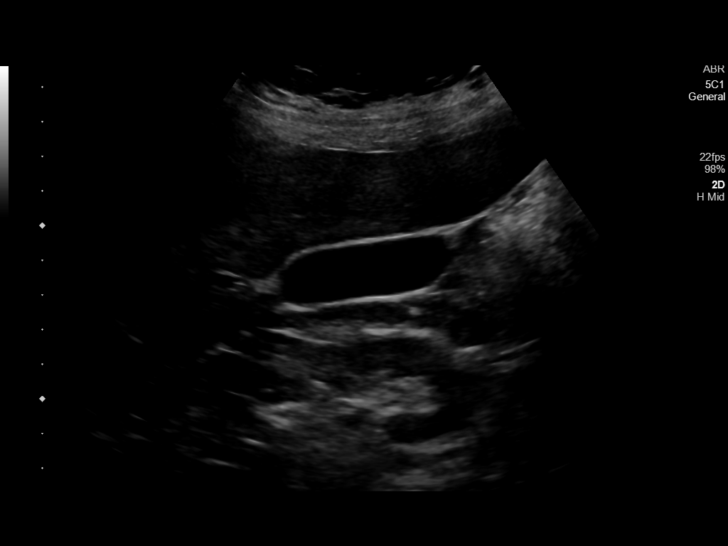
[im 23/51]
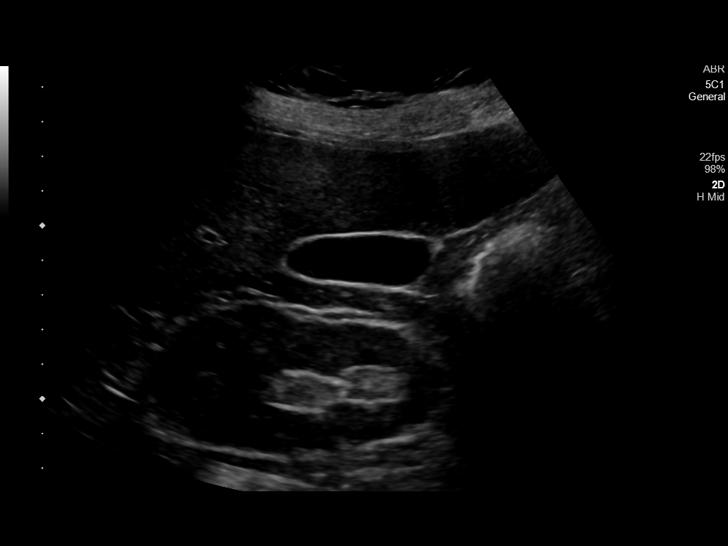
[im 28/51]
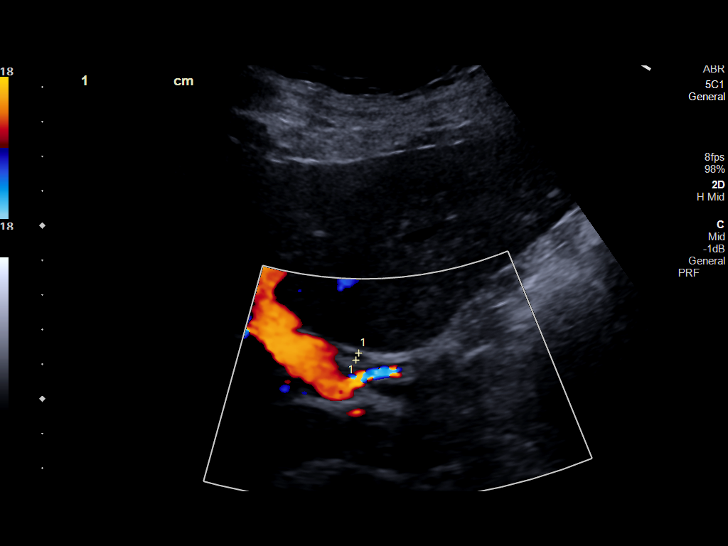
[im 32/51]
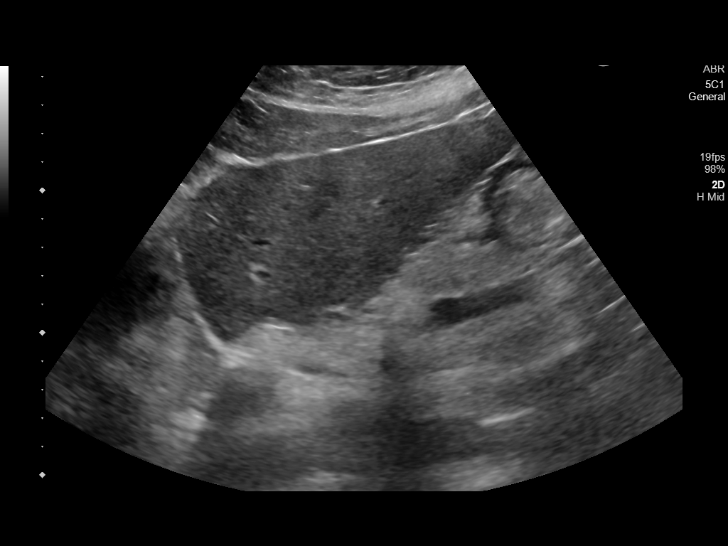
[im 34/51]
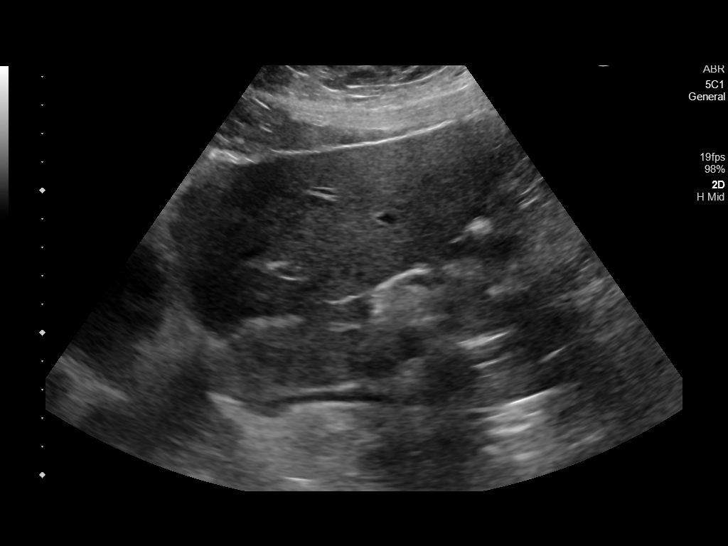
[im 38/51]
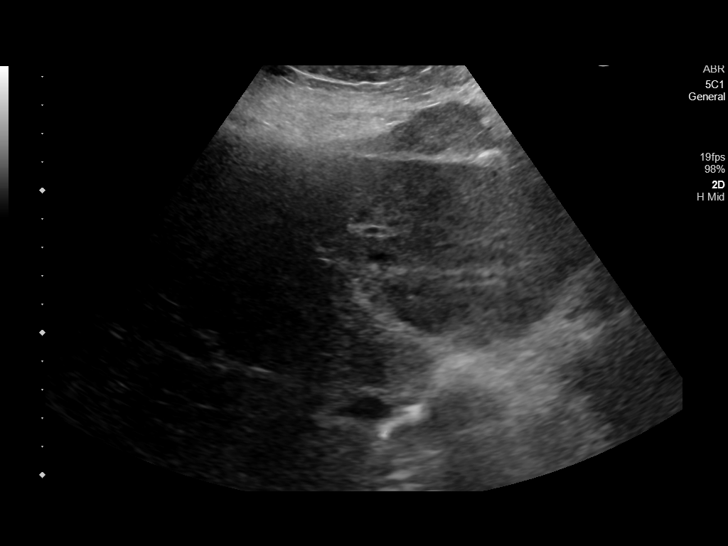
[im 42/51]
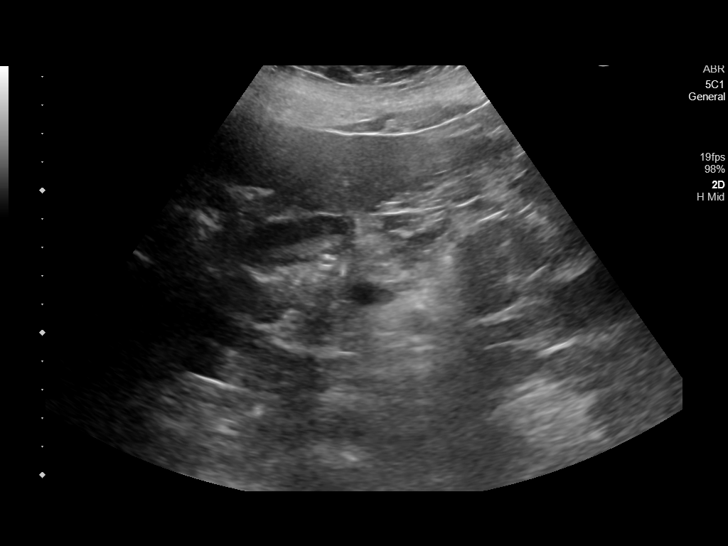
[im 46/51]
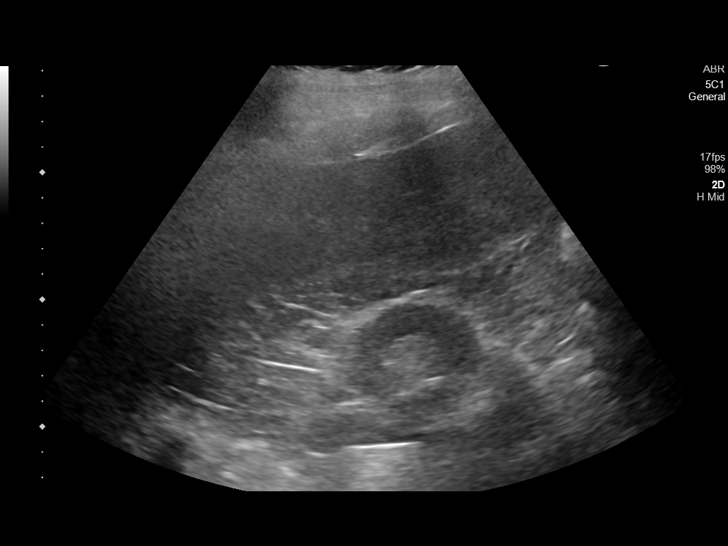
[im 51/51]
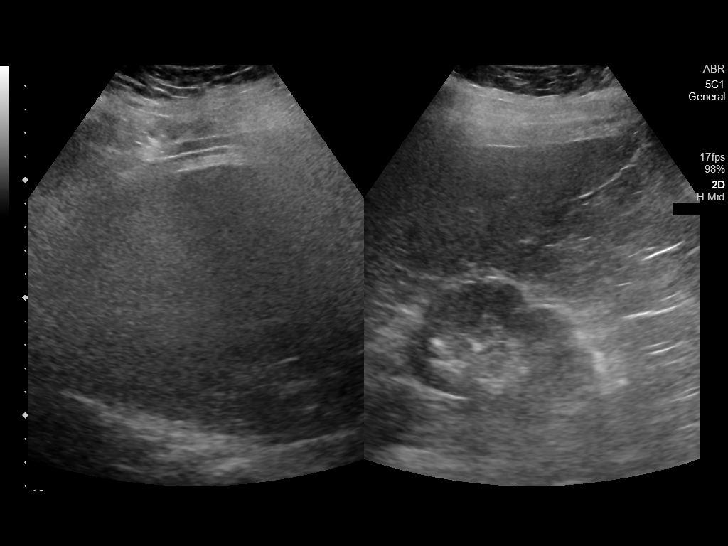

[14 of 25 positions shown; findings below may reference images not displayed]

FINDINGS: Gallbladder:

No gallstones or wall thickening visualized. No sonographic Murphy
sign noted by sonographer.

Common bile duct:

Diameter: 2 mm

Liver:

No focal lesion identified. Diffusely echogenic liver. Portal vein
is patent on color Doppler imaging with normal direction of blood
flow towards the liver.
IMPRESSION: 1. No acute finding.  No cholelithiasis.
2. Hepatic steatosis.

## 2020-05-27 MED ORDER — ONDANSETRON HCL 4 MG/2ML IJ SOLN
4.0000 mg | Freq: Once | INTRAMUSCULAR | Status: AC
  Administered 2020-05-27: 4 mg via INTRAVENOUS
  Filled 2020-05-27: qty 2

## 2020-05-27 MED ORDER — ONDANSETRON 4 MG PO TBDP: 4.0000 mg | ORAL_TABLET | Freq: Three times a day (TID) | ORAL | 0 refills | Status: DC | PRN

## 2020-05-27 MED ORDER — OXYCODONE-ACETAMINOPHEN 5-325 MG PO TABS: 1.0000 | ORAL_TABLET | ORAL | 0 refills | Status: DC | PRN

## 2020-05-27 NOTE — ED Notes (Signed)
Pen pad not working in room. Patient verbalized understanding of discharge.

## 2020-05-27 NOTE — ED Triage Notes (Signed)
Pt arrives via EMS from [facility] for upper R sided abdominal pain- pt was 1 hour into her dialysis treatment when she noticed the pain and had an episode of vomiting- pt also did not complete dialysis on Thursday- pt states the abdominal pain has been going on for a couple days and she has had vomiting x2 weeks

## 2020-05-27 NOTE — ED Notes (Signed)
Repeat lt green sent to lab ?

## 2020-05-27 NOTE — ED Provider Notes (Signed)
[facility] Emergency Department Provider Note  Time seen: 9:51 AM  I have reviewed the triage vital signs and the nursing notes.   HISTORY  Chief Complaint Abdominal Pain   HPI [name] is a 50 y.o. female with a past medical history of anxiety, asthma, CHF, ESRD on HD, diabetes, hypertension, chronic O2 3 L, presents to the emergency department for upper abdominal discomfort nausea and vomiting.  According to the patient for the past 2 weeks or so she has had increased cough and congestion, her doctor recently put her on a prednisone taper.  She states over the past 1 week she has been experiencing pain in the right upper quadrant worse when she coughs.  States the pain seems to worsen when she is on dialysis such as last Thursday and then again today.  Had an episode of vomiting today so they stopped her dialysis after approximately 1 hour and sent the patient to the emergency department.  Patient denies any fever.  Denies any diarrhea.  States the pain is somewhat worse when she eats.   Past Medical History:  Diagnosis Date  . (HFpEF) heart failure with preserved ejection fraction ([place])    a. 11/2014 Echo: EF 50-55%, Gr1 DD; b. 06/2016 Echo: EF 60-65%, no rwma, mod dil LA, nl RV, triv eff.  . Anxiety   . Asthma   . Bronchitis   . CHF (congestive heart failure) ([place])   . Chronic cough   . Chronic respiratory failure with hypoxia and hypercapnia (HCC)    a. Home O2.  (2-4L)  . CKD (chronic kidney disease), stage III ([place])   . Depression   . Diabetes mellitus without complication ([place])   . Diverticulosis   . Dyspnea   . Dysrhythmia    per pt, no arrythmias  . Environmental and seasonal allergies   . Extreme obesity with alveolar hypoventilation (HCC)   . Hypertension   . Iron deficiency anemia    a. chronic blood loss->heavy menses.  . Kidney failure    dialysis tues/thur/sat   ... left arm shunt  . Orthopnea   . Oxygen dependent    3l/min  .  Pericardial effusion    a. 11/2014 CT Chest: Mod effusion; b. 11/2014 Echo: small to mod circumferential effusion. No hemodynamic compromise.  . Pneumonia    history  . Poorly controlled diabetes mellitus ([place])    a. 11/2014 A1c 13.2.  [name] Swelling of both lower extremities     Patient Active Problem List   Diagnosis Date Noted  . Chronic respiratory failure with hypoxia and hypercapnia (HCC)   . Extreme obesity with alveolar hypoventilation (HCC)   . Palliative care by specialist   . Goals of care, counseling/discussion   . ESRD (end stage renal disease) ([place])   . DNR (do not resuscitate) discussion   . Stage 4 chronic kidney disease ([place])   . Adjustment disorder with mixed anxiety and depressed mood 03/31/2017  . Dysthymia 03/31/2017  . Chronic pain syndrome 03/31/2017  . Palliative care encounter   . Obesity hypoventilation syndrome ([place]) 03/09/2017  . Chronic diastolic heart failure ([place]) 01/07/2017  . Depression 09/24/2016  . Acute on chronic diastolic heart failure ([place]) 07/04/2016  . COPD with chronic bronchitis ([place]) 07/04/2016  . CKD (chronic kidney disease), stage III ([place]) 06/26/2016  . COPD exacerbation ([place]) 03/25/2016  . Anxiety 10/02/2015  . Asthma 08/30/2015  . Poorly controlled type 2 diabetes mellitus ([place]) 03/13/2015  . Iron deficiency  anemia 02/17/2015  . Obesity, morbid ([place])   . Shortness of breath   . Essential hypertension   . Hypertensive heart disease with congestive heart failure ([place]) 12/18/2014  . Diabetes ([place]) 12/15/2014    Past Surgical History:  Procedure Laterality Date  . A/V FISTULAGRAM Left 06/09/2017   Procedure: A/V FISTULAGRAM;  Surgeon: Algernon Huxley, MD;  Location: [facility];  Service: Cardiovascular;  Laterality: Left;  . A/V FISTULAGRAM Left 06/16/2017   Procedure: A/V FISTULAGRAM;  Surgeon: Algernon Huxley, MD;  Location: [place] [facility];  Service: Cardiovascular;  Laterality: Left;  . A/V FISTULAGRAM Left 01/28/2018    Procedure: A/V FISTULAGRAM;  Surgeon: Algernon Huxley, MD;  Location: [facility];  Service: Cardiovascular;  Laterality: Left;  . AV FISTULA PLACEMENT Left 04/25/2017   Procedure: ARTERIOVENOUS (AV) FISTULA CREATION;  Surgeon: Algernon Huxley, MD;  Location: [facility];  Service: Vascular;  Laterality: Left;  . CATARACT EXTRACTION W/PHACO Left 01/21/2018   Procedure: CATARACT EXTRACTION PHACO AND INTRAOCULAR LENS PLACEMENT (IOC);  Surgeon: Leandrew Koyanagi, MD;  Location: [facility];  Service: Ophthalmology;  Laterality: Left;  [place] 01:55.5 CDE 9.05 EAUP 13.9 Fluid Pack Lot # [id number]  . CESAREAN SECTION     times 3  . DIALYSIS/PERMA CATHETER INSERTION Right 04/04/2017   Procedure: DIALYSIS/PERMA CATHETER INSERTION;  Surgeon: Katha Cabal, MD;  Location: [facility];  Service: Cardiovascular;  Laterality: Right;  . DIALYSIS/PERMA CATHETER REMOVAL N/A 09/17/2017   Procedure: DIALYSIS/PERMA CATHETER REMOVAL;  Surgeon: Algernon Huxley, MD;  Location: [place];  Service: Cardiovascular;  Laterality: N/A;  . INSERTION OF DIALYSIS CATHETER N/A 04/16/2017   Procedure: INSERTION OF DIALYSIS PERM CATHETER;  Surgeon: Algernon Huxley, MD;  Location: [facility];  Service: Vascular;  Laterality: N/A;    Prior to Admission medications   Medication Sig Start Date End Date Taking? Authorizing Provider  acetaminophen (TYLENOL) 500 MG tablet Take 1,000 mg by mouth every 6 (six) hours as needed for mild pain, fever or headache.    [provider]  albuterol (PROVENTIL) (2.5 MG/3ML) 0.083% nebulizer solution Take 2.5 mg by nebulization every 6 (six) hours as needed for wheezing or shortness of breath.    [provider]  albuterol (VENTOLIN HFA) 108 (90 Base) MCG/ACT inhaler Inhale 2 puffs into the lungs every 4 (four) hours as needed for wheezing or shortness of breath. 12/30/19   Tyler Pita, MD  aspirin EC 81 MG tablet Take 81 mg by mouth daily.     [provider]  atorvastatin (LIPITOR) 80 MG tablet Take 80 mg by mouth daily at 6 PM.    [provider]  b complex-vitamin c-folic acid (NEPHRO-VITE) 0.8 MG TABS tablet Take 1 tablet by mouth at bedtime.    [provider]  Cholecalciferol (HM VITAMIN D3) 4000 units CAPS Take 1 capsule by mouth once a week.    [provider]  cloNIDine (CATAPRES) 0.2 MG tablet Take 1 tablet (0.2 mg total) by mouth 2 (two) times daily. Patient taking differently: Take 0.2 mg by mouth 2 (two) times daily.  01/23/17   Alisa Graff, FNP  docusate sodium (COLACE) 100 MG capsule Take 100 mg by mouth daily.    [provider]  ELDERBERRY PO Take by mouth daily.    [provider]  ferric citrate (AURYXIA) 1 GM 210 MG(Fe) tablet Take 210 mg by mouth 3 (three) times daily with meals.  [provider]  insulin aspart (NOVOLOG) 100 UNIT/ML injection Inject 0-5 Units into the skin at bedtime. 04/26/17   Epifanio Lesches, MD  insulin aspart (NOVOLOG) 100 UNIT/ML injection Inject 0-15 Units into the skin 3 (three) times daily with meals. 04/26/17   Epifanio Lesches, MD  insulin degludec (TRESIBA) 100 UNIT/ML SOPN FlexTouch Pen Inject 36 Units into the skin daily at 10 pm.     [provider]  ipratropium-albuterol (DUONEB) 0.5-2.5 (3) MG/3ML SOLN Take 3 mLs by nebulization every 6 (six) hours as needed. 03/10/17   Salary, Avel Peace, MD  lactulose (CHRONULAC) 10 GM/15ML solution Take 30 mLs (20 g total) by mouth daily as needed for severe constipation. 04/26/17   Epifanio Lesches, MD  lidocaine-prilocaine (EMLA) cream Apply topically as needed (For access for dialysis).    [provider]  losartan (COZAAR) 50 MG tablet Take 50 mg by mouth 2 (two) times daily. On dialysis days, patient is taking in the evening    [provider]  metoprolol tartrate (LOPRESSOR) 100 MG tablet Take 1 tablet (100 mg total) by mouth 2 (two) times daily. 11/16/16   Bettey Costa, MD  montelukast (SINGULAIR) 10 MG tablet Take 1 tablet (10 mg total) by mouth daily. 10/13/19 01/11/20  Tyler Pita, MD  OXYGEN Inhale 2-4 L into the lungs.    [provider]  OZEMPIC, 0.25 OR 0.5 MG/DOSE, 2 MG/1.5ML SOPN  04/03/19   [provider]  polyethylene glycol (MIRALAX / GLYCOLAX) packet Take 17 g by mouth daily as needed for mild constipation. 06/29/16   Vaughan Basta, MD  torsemide (DEMADEX) 100 MG tablet Take 1 tablet (100 mg total) by mouth 2 (two) times daily. 03/10/17   Salary, Avel Peace, MD  TRELEGY ELLIPTA 200-62.5-25 MCG/INH AEPB INHALE 1 PUFF ONCE DAILY 04/21/20   Tyler Pita, MD    Allergies  Allergen Reactions  . Dust Mite Extract Shortness Of Breath and Itching  . Eggs Or Egg-Derived Products Itching    MAKES TONGUE ITCH but she will eat, only in small amounts  . Mold Extract [Trichophyton] Shortness Of Breath and Itching  . Pollen Extract Shortness Of Breath    Itching, shortness of breath, asthma like symptoms  . Sulfa Antibiotics Itching  . Feraheme [Ferumoxytol] Itching    Uses benadryl so she can get the medicine    Family History  Problem Relation Age of Onset  . Diabetes Mother   . Hypertension Mother   . Hypertension Father   . Diabetes Father   . Hypertension Other   . Diabetes Other   . Breast cancer Maternal Aunt 74  . Breast cancer Maternal Aunt 69    Social History Social History   Tobacco Use  . Smoking status: Never Smoker  . Smokeless tobacco: Never Used  Vaping Use  . Vaping Use: Never used  Substance Use Topics  . Alcohol use: Yes    Alcohol/week: 0.0 standard drinks    Comment: occasional  . Drug use: Yes    Types: Marijuana    Review of Systems Constitutional: Negative for fever. Cardiovascular: Negative for chest pain. Respiratory: Negative for shortness of breath. Gastrointestinal: Right upper quadrant abdominal pain.  Nausea and vomiting x1 today.  No diarrhea. Genitourinary:  Will occasionally make urine but not every day. Musculoskeletal: Negative for musculoskeletal complaints Neurological: Negative for headache All other ROS negative  ____________________________________________   PHYSICAL EXAM:  VITAL SIGNS: ED Triage Vitals  Enc Vitals Group  BP 05/27/20 0933 (!) 122/94     Pulse Rate 05/27/20 0933 (!) 109     Resp 05/27/20 0933 20     Temp 05/27/20 0933 98.3 F (36.8 C)     Temp Source 05/27/20 0933 Oral     SpO2 05/27/20 0933 100 %     Weight 05/27/20 0934 280 lb (127 kg)     Height 05/27/20 0934 5\' 4"  (1.626 m)     Head Circumference --      Peak Flow --      Pain Score 05/27/20 0934 8     Pain Loc --      Pain Edu? --      Excl. in [place]? --     Constitutional: Alert and oriented. Well appearing and in no distress. Eyes: Normal exam ENT      Head: Normocephalic and atraumatic.      Mouth/Throat: Mucous membranes are moist. Cardiovascular: Normal rate, regular rhythm. No murmur Respiratory: Normal respiratory effort without tachypnea nor retractions. Breath sounds are clear Gastrointestinal: Soft, mild right upper quadrant tenderness palpation without rebound guarding or distention. Musculoskeletal: Nontender with normal range of motion in all extremities. Neurologic:  Normal speech and language. No gross focal neurologic deficits  Skin:  Skin is warm, dry and intact.  Psychiatric: Mood and affect are normal.   ____________________________________________    EKG  EKG viewed and interpreted by myself shows sinus tachycardia 105 bpm with a narrow QRS, normal axis, normal intervals, nonspecific ST changes  ____________________________________________    RADIOLOGY  Ultrasound is negative for acute findings.  ____________________________________________   INITIAL IMPRESSION / ASSESSMENT AND PLAN / ED COURSE  Pertinent labs & imaging results that were available during my care of the patient were reviewed by me and considered  in my medical decision making (see chart for details).   Patient presents to the emergency department for right upper quadrant abdominal pain nausea vomiting.  States he has had a cough for approximately 2 weeks currently on a prednisone taper which explain the mild leukocytosis of 14,500.  Patient states she has noted some association when she eats food that the pain worsens.  Given the patient's right upper quadrant discomfort we will proceed with an ultrasound to evaluate the patient's gallbladder.  Remainder the lab work is pending including LFTs and lipase.  As the patient is on dialysis we will need to assess the patient's potassium as she only received approximately 1 hour of her session today.  [name] labs do show mild lipase elevation.  Patient states rare alcohol use.  Ultrasound has resulted largely within normal limits, no acute findings or gallstones.  [name] LFTs are largely within normal limits as well.  Patient does have mild hyponatremia however in reviewing the patient's chart she typically has lower sodium levels.  [name] discomfort could be related to mild pancreatitis.  I spoke to the patient's nephrologist Dr. Candiss Norse regarding the patient's work-up as well.  We will discharge patient home discussed return precautions as well as dietary precautions, avoiding fatty/oily foods, avoiding alcohol.  [name] was evaluated in Emergency Department on 05/27/2020 for the symptoms described in the history of present illness. She was evaluated in the context of the global COVID-19 pandemic, which necessitated consideration that the patient might be at risk for infection with the SARS-CoV-2 virus that causes COVID-19. Institutional protocols and algorithms that pertain to the evaluation of patients at risk for COVID-19 are in a state of rapid change based on  information released by regulatory bodies including the CDC and federal and state organizations. These policies and algorithms were  followed during the patient's care in the ED.  ____________________________________________   FINAL CLINICAL IMPRESSION(S) / ED DIAGNOSES  Right upper quadrant abdominal pain Acute pancreatitis   Harvest Dark, MD 05/27/20 1233

## 2020-06-26 ENCOUNTER — Other Ambulatory Visit: Payer: Self-pay | Admitting: Pulmonary Disease

## 2020-07-03 ENCOUNTER — Ambulatory Visit: Payer: Medicare HMO | Admitting: Pulmonary Disease

## 2020-07-26 DIAGNOSIS — I5040 Unspecified combined systolic (congestive) and diastolic (congestive) heart failure: Secondary | ICD-10-CM | POA: Diagnosis not present

## 2020-07-26 DIAGNOSIS — Z992 Dependence on renal dialysis: Secondary | ICD-10-CM | POA: Diagnosis not present

## 2020-07-26 DIAGNOSIS — J45909 Unspecified asthma, uncomplicated: Secondary | ICD-10-CM | POA: Diagnosis not present

## 2020-07-26 DIAGNOSIS — N186 End stage renal disease: Secondary | ICD-10-CM | POA: Diagnosis not present

## 2020-07-27 DIAGNOSIS — N186 End stage renal disease: Secondary | ICD-10-CM | POA: Diagnosis not present

## 2020-07-27 DIAGNOSIS — Z992 Dependence on renal dialysis: Secondary | ICD-10-CM | POA: Diagnosis not present

## 2020-07-29 DIAGNOSIS — N186 End stage renal disease: Secondary | ICD-10-CM | POA: Diagnosis not present

## 2020-07-29 DIAGNOSIS — Z992 Dependence on renal dialysis: Secondary | ICD-10-CM | POA: Diagnosis not present

## 2020-08-01 DIAGNOSIS — N186 End stage renal disease: Secondary | ICD-10-CM | POA: Diagnosis not present

## 2020-08-01 DIAGNOSIS — Z992 Dependence on renal dialysis: Secondary | ICD-10-CM | POA: Diagnosis not present

## 2020-08-03 DIAGNOSIS — Z992 Dependence on renal dialysis: Secondary | ICD-10-CM | POA: Diagnosis not present

## 2020-08-03 DIAGNOSIS — N186 End stage renal disease: Secondary | ICD-10-CM | POA: Diagnosis not present

## 2020-08-05 DIAGNOSIS — N186 End stage renal disease: Secondary | ICD-10-CM | POA: Diagnosis not present

## 2020-08-05 DIAGNOSIS — Z992 Dependence on renal dialysis: Secondary | ICD-10-CM | POA: Diagnosis not present

## 2020-08-06 DIAGNOSIS — Z992 Dependence on renal dialysis: Secondary | ICD-10-CM | POA: Diagnosis not present

## 2020-08-06 DIAGNOSIS — N186 End stage renal disease: Secondary | ICD-10-CM | POA: Diagnosis not present

## 2020-08-10 DIAGNOSIS — Z992 Dependence on renal dialysis: Secondary | ICD-10-CM | POA: Diagnosis not present

## 2020-08-10 DIAGNOSIS — N186 End stage renal disease: Secondary | ICD-10-CM | POA: Diagnosis not present

## 2020-08-12 DIAGNOSIS — Z992 Dependence on renal dialysis: Secondary | ICD-10-CM | POA: Diagnosis not present

## 2020-08-12 DIAGNOSIS — N186 End stage renal disease: Secondary | ICD-10-CM | POA: Diagnosis not present

## 2020-08-15 DIAGNOSIS — Z992 Dependence on renal dialysis: Secondary | ICD-10-CM | POA: Diagnosis not present

## 2020-08-15 DIAGNOSIS — N186 End stage renal disease: Secondary | ICD-10-CM | POA: Diagnosis not present

## 2020-08-17 DIAGNOSIS — Z992 Dependence on renal dialysis: Secondary | ICD-10-CM | POA: Diagnosis not present

## 2020-08-17 DIAGNOSIS — N186 End stage renal disease: Secondary | ICD-10-CM | POA: Diagnosis not present

## 2020-08-18 DIAGNOSIS — J449 Chronic obstructive pulmonary disease, unspecified: Secondary | ICD-10-CM | POA: Diagnosis not present

## 2020-08-18 DIAGNOSIS — J9611 Chronic respiratory failure with hypoxia: Secondary | ICD-10-CM | POA: Diagnosis not present

## 2020-08-18 DIAGNOSIS — I5032 Chronic diastolic (congestive) heart failure: Secondary | ICD-10-CM | POA: Diagnosis not present

## 2020-08-19 DIAGNOSIS — Z992 Dependence on renal dialysis: Secondary | ICD-10-CM | POA: Diagnosis not present

## 2020-08-19 DIAGNOSIS — N186 End stage renal disease: Secondary | ICD-10-CM | POA: Diagnosis not present

## 2020-08-22 DIAGNOSIS — N186 End stage renal disease: Secondary | ICD-10-CM | POA: Diagnosis not present

## 2020-08-22 DIAGNOSIS — Z992 Dependence on renal dialysis: Secondary | ICD-10-CM | POA: Diagnosis not present

## 2020-08-24 DIAGNOSIS — Z992 Dependence on renal dialysis: Secondary | ICD-10-CM | POA: Diagnosis not present

## 2020-08-24 DIAGNOSIS — N186 End stage renal disease: Secondary | ICD-10-CM | POA: Diagnosis not present

## 2020-08-25 DIAGNOSIS — I5040 Unspecified combined systolic (congestive) and diastolic (congestive) heart failure: Secondary | ICD-10-CM | POA: Diagnosis not present

## 2020-08-25 DIAGNOSIS — J45909 Unspecified asthma, uncomplicated: Secondary | ICD-10-CM | POA: Diagnosis not present

## 2020-08-25 DIAGNOSIS — Z992 Dependence on renal dialysis: Secondary | ICD-10-CM | POA: Diagnosis not present

## 2020-08-25 DIAGNOSIS — N186 End stage renal disease: Secondary | ICD-10-CM | POA: Diagnosis not present

## 2020-08-26 DIAGNOSIS — N186 End stage renal disease: Secondary | ICD-10-CM | POA: Diagnosis not present

## 2020-08-26 DIAGNOSIS — Z992 Dependence on renal dialysis: Secondary | ICD-10-CM | POA: Diagnosis not present

## 2020-08-28 DIAGNOSIS — R079 Chest pain, unspecified: Secondary | ICD-10-CM | POA: Diagnosis not present

## 2020-08-28 DIAGNOSIS — E11621 Type 2 diabetes mellitus with foot ulcer: Secondary | ICD-10-CM | POA: Diagnosis not present

## 2020-08-28 DIAGNOSIS — M79671 Pain in right foot: Secondary | ICD-10-CM | POA: Diagnosis not present

## 2020-08-28 DIAGNOSIS — H9209 Otalgia, unspecified ear: Secondary | ICD-10-CM | POA: Diagnosis not present

## 2020-08-28 DIAGNOSIS — L97519 Non-pressure chronic ulcer of other part of right foot with unspecified severity: Secondary | ICD-10-CM | POA: Diagnosis not present

## 2020-08-28 DIAGNOSIS — J449 Chronic obstructive pulmonary disease, unspecified: Secondary | ICD-10-CM | POA: Diagnosis not present

## 2020-08-28 DIAGNOSIS — L539 Erythematous condition, unspecified: Secondary | ICD-10-CM | POA: Diagnosis not present

## 2020-08-28 DIAGNOSIS — L97511 Non-pressure chronic ulcer of other part of right foot limited to breakdown of skin: Secondary | ICD-10-CM | POA: Diagnosis not present

## 2020-08-28 DIAGNOSIS — I509 Heart failure, unspecified: Secondary | ICD-10-CM | POA: Diagnosis not present

## 2020-08-28 DIAGNOSIS — I132 Hypertensive heart and chronic kidney disease with heart failure and with stage 5 chronic kidney disease, or end stage renal disease: Secondary | ICD-10-CM | POA: Diagnosis not present

## 2020-08-28 DIAGNOSIS — J3489 Other specified disorders of nose and nasal sinuses: Secondary | ICD-10-CM | POA: Diagnosis not present

## 2020-08-28 DIAGNOSIS — R918 Other nonspecific abnormal finding of lung field: Secondary | ICD-10-CM | POA: Diagnosis not present

## 2020-08-28 DIAGNOSIS — E1122 Type 2 diabetes mellitus with diabetic chronic kidney disease: Secondary | ICD-10-CM | POA: Diagnosis not present

## 2020-08-28 DIAGNOSIS — H9203 Otalgia, bilateral: Secondary | ICD-10-CM | POA: Diagnosis not present

## 2020-08-28 DIAGNOSIS — R9431 Abnormal electrocardiogram [ECG] [EKG]: Secondary | ICD-10-CM | POA: Diagnosis not present

## 2020-08-29 DIAGNOSIS — N186 End stage renal disease: Secondary | ICD-10-CM | POA: Diagnosis not present

## 2020-08-29 DIAGNOSIS — Z992 Dependence on renal dialysis: Secondary | ICD-10-CM | POA: Diagnosis not present

## 2020-08-31 DIAGNOSIS — Z992 Dependence on renal dialysis: Secondary | ICD-10-CM | POA: Diagnosis not present

## 2020-08-31 DIAGNOSIS — N186 End stage renal disease: Secondary | ICD-10-CM | POA: Diagnosis not present

## 2020-09-02 DIAGNOSIS — Z992 Dependence on renal dialysis: Secondary | ICD-10-CM | POA: Diagnosis not present

## 2020-09-02 DIAGNOSIS — N186 End stage renal disease: Secondary | ICD-10-CM | POA: Diagnosis not present

## 2020-09-03 DIAGNOSIS — Z992 Dependence on renal dialysis: Secondary | ICD-10-CM | POA: Diagnosis not present

## 2020-09-03 DIAGNOSIS — R42 Dizziness and giddiness: Secondary | ICD-10-CM | POA: Diagnosis not present

## 2020-09-03 DIAGNOSIS — Z8679 Personal history of other diseases of the circulatory system: Secondary | ICD-10-CM | POA: Diagnosis not present

## 2020-09-05 DIAGNOSIS — Z992 Dependence on renal dialysis: Secondary | ICD-10-CM | POA: Diagnosis not present

## 2020-09-05 DIAGNOSIS — N186 End stage renal disease: Secondary | ICD-10-CM | POA: Diagnosis not present

## 2020-09-07 DIAGNOSIS — Z992 Dependence on renal dialysis: Secondary | ICD-10-CM | POA: Diagnosis not present

## 2020-09-07 DIAGNOSIS — N186 End stage renal disease: Secondary | ICD-10-CM | POA: Diagnosis not present

## 2020-09-09 DIAGNOSIS — Z992 Dependence on renal dialysis: Secondary | ICD-10-CM | POA: Diagnosis not present

## 2020-09-09 DIAGNOSIS — N186 End stage renal disease: Secondary | ICD-10-CM | POA: Diagnosis not present

## 2020-09-11 DIAGNOSIS — Z8669 Personal history of other diseases of the nervous system and sense organs: Secondary | ICD-10-CM | POA: Diagnosis not present

## 2020-09-11 DIAGNOSIS — Z09 Encounter for follow-up examination after completed treatment for conditions other than malignant neoplasm: Secondary | ICD-10-CM | POA: Diagnosis not present

## 2020-09-11 DIAGNOSIS — Z992 Dependence on renal dialysis: Secondary | ICD-10-CM | POA: Diagnosis not present

## 2020-09-12 DIAGNOSIS — Z992 Dependence on renal dialysis: Secondary | ICD-10-CM | POA: Diagnosis not present

## 2020-09-12 DIAGNOSIS — N186 End stage renal disease: Secondary | ICD-10-CM | POA: Diagnosis not present

## 2020-09-14 DIAGNOSIS — Z992 Dependence on renal dialysis: Secondary | ICD-10-CM | POA: Diagnosis not present

## 2020-09-14 DIAGNOSIS — N186 End stage renal disease: Secondary | ICD-10-CM | POA: Diagnosis not present

## 2020-09-15 DIAGNOSIS — N186 End stage renal disease: Secondary | ICD-10-CM | POA: Diagnosis not present

## 2020-09-15 DIAGNOSIS — R109 Unspecified abdominal pain: Secondary | ICD-10-CM | POA: Diagnosis not present

## 2020-09-15 DIAGNOSIS — E1165 Type 2 diabetes mellitus with hyperglycemia: Secondary | ICD-10-CM | POA: Diagnosis not present

## 2020-09-15 DIAGNOSIS — I10 Essential (primary) hypertension: Secondary | ICD-10-CM | POA: Diagnosis not present

## 2020-09-15 DIAGNOSIS — E08622 Diabetes mellitus due to underlying condition with other skin ulcer: Secondary | ICD-10-CM | POA: Diagnosis not present

## 2020-09-16 DIAGNOSIS — Z992 Dependence on renal dialysis: Secondary | ICD-10-CM | POA: Diagnosis not present

## 2020-09-16 DIAGNOSIS — N186 End stage renal disease: Secondary | ICD-10-CM | POA: Diagnosis not present

## 2020-09-17 DIAGNOSIS — J449 Chronic obstructive pulmonary disease, unspecified: Secondary | ICD-10-CM | POA: Diagnosis not present

## 2020-09-17 DIAGNOSIS — I5032 Chronic diastolic (congestive) heart failure: Secondary | ICD-10-CM | POA: Diagnosis not present

## 2020-09-17 DIAGNOSIS — J9611 Chronic respiratory failure with hypoxia: Secondary | ICD-10-CM | POA: Diagnosis not present

## 2020-09-19 DIAGNOSIS — Z992 Dependence on renal dialysis: Secondary | ICD-10-CM | POA: Diagnosis not present

## 2020-09-19 DIAGNOSIS — N186 End stage renal disease: Secondary | ICD-10-CM | POA: Diagnosis not present

## 2020-09-19 DIAGNOSIS — E119 Type 2 diabetes mellitus without complications: Secondary | ICD-10-CM | POA: Diagnosis not present

## 2020-09-19 DIAGNOSIS — Z794 Long term (current) use of insulin: Secondary | ICD-10-CM | POA: Diagnosis not present

## 2020-09-21 DIAGNOSIS — N186 End stage renal disease: Secondary | ICD-10-CM | POA: Diagnosis not present

## 2020-09-21 DIAGNOSIS — Z992 Dependence on renal dialysis: Secondary | ICD-10-CM | POA: Diagnosis not present

## 2020-09-23 DIAGNOSIS — Z992 Dependence on renal dialysis: Secondary | ICD-10-CM | POA: Diagnosis not present

## 2020-09-23 DIAGNOSIS — N186 End stage renal disease: Secondary | ICD-10-CM | POA: Diagnosis not present

## 2020-09-24 DIAGNOSIS — Z992 Dependence on renal dialysis: Secondary | ICD-10-CM | POA: Diagnosis not present

## 2020-09-24 DIAGNOSIS — N186 End stage renal disease: Secondary | ICD-10-CM | POA: Diagnosis not present

## 2020-09-25 DIAGNOSIS — Z992 Dependence on renal dialysis: Secondary | ICD-10-CM | POA: Diagnosis not present

## 2020-09-25 DIAGNOSIS — I5040 Unspecified combined systolic (congestive) and diastolic (congestive) heart failure: Secondary | ICD-10-CM | POA: Diagnosis not present

## 2020-09-25 DIAGNOSIS — N186 End stage renal disease: Secondary | ICD-10-CM | POA: Diagnosis not present

## 2020-09-25 DIAGNOSIS — J45909 Unspecified asthma, uncomplicated: Secondary | ICD-10-CM | POA: Diagnosis not present

## 2020-09-27 DIAGNOSIS — Z20822 Contact with and (suspected) exposure to covid-19: Secondary | ICD-10-CM | POA: Diagnosis not present

## 2020-09-27 DIAGNOSIS — J40 Bronchitis, not specified as acute or chronic: Secondary | ICD-10-CM | POA: Diagnosis not present

## 2020-09-27 DIAGNOSIS — R059 Cough, unspecified: Secondary | ICD-10-CM | POA: Diagnosis not present

## 2020-09-28 DIAGNOSIS — Z992 Dependence on renal dialysis: Secondary | ICD-10-CM | POA: Diagnosis not present

## 2020-09-28 DIAGNOSIS — N186 End stage renal disease: Secondary | ICD-10-CM | POA: Diagnosis not present

## 2020-10-03 ENCOUNTER — Other Ambulatory Visit: Payer: Self-pay | Admitting: Pulmonary Disease

## 2020-10-03 DIAGNOSIS — N186 End stage renal disease: Secondary | ICD-10-CM | POA: Diagnosis not present

## 2020-10-03 DIAGNOSIS — Z992 Dependence on renal dialysis: Secondary | ICD-10-CM | POA: Diagnosis not present

## 2020-10-04 ENCOUNTER — Encounter: Payer: Medicare HMO | Attending: Physician Assistant | Admitting: Physician Assistant

## 2020-10-04 ENCOUNTER — Other Ambulatory Visit: Payer: Self-pay

## 2020-10-04 DIAGNOSIS — Z9981 Dependence on supplemental oxygen: Secondary | ICD-10-CM | POA: Insufficient documentation

## 2020-10-04 DIAGNOSIS — I5042 Chronic combined systolic (congestive) and diastolic (congestive) heart failure: Secondary | ICD-10-CM | POA: Insufficient documentation

## 2020-10-04 DIAGNOSIS — Z882 Allergy status to sulfonamides status: Secondary | ICD-10-CM | POA: Insufficient documentation

## 2020-10-04 DIAGNOSIS — E1140 Type 2 diabetes mellitus with diabetic neuropathy, unspecified: Secondary | ICD-10-CM | POA: Insufficient documentation

## 2020-10-04 DIAGNOSIS — I132 Hypertensive heart and chronic kidney disease with heart failure and with stage 5 chronic kidney disease, or end stage renal disease: Secondary | ICD-10-CM | POA: Insufficient documentation

## 2020-10-04 DIAGNOSIS — L97513 Non-pressure chronic ulcer of other part of right foot with necrosis of muscle: Secondary | ICD-10-CM | POA: Insufficient documentation

## 2020-10-04 DIAGNOSIS — E11621 Type 2 diabetes mellitus with foot ulcer: Secondary | ICD-10-CM | POA: Insufficient documentation

## 2020-10-04 DIAGNOSIS — Z992 Dependence on renal dialysis: Secondary | ICD-10-CM | POA: Diagnosis not present

## 2020-10-04 DIAGNOSIS — E1122 Type 2 diabetes mellitus with diabetic chronic kidney disease: Secondary | ICD-10-CM | POA: Diagnosis not present

## 2020-10-04 DIAGNOSIS — N186 End stage renal disease: Secondary | ICD-10-CM | POA: Diagnosis not present

## 2020-10-04 DIAGNOSIS — L97512 Non-pressure chronic ulcer of other part of right foot with fat layer exposed: Secondary | ICD-10-CM | POA: Diagnosis not present

## 2020-10-05 ENCOUNTER — Other Ambulatory Visit: Payer: Self-pay | Admitting: Physician Assistant

## 2020-10-05 DIAGNOSIS — Z992 Dependence on renal dialysis: Secondary | ICD-10-CM | POA: Diagnosis not present

## 2020-10-05 DIAGNOSIS — L97513 Non-pressure chronic ulcer of other part of right foot with necrosis of muscle: Secondary | ICD-10-CM

## 2020-10-05 DIAGNOSIS — N186 End stage renal disease: Secondary | ICD-10-CM | POA: Diagnosis not present

## 2020-10-05 NOTE — Progress Notes (Signed)
[name], [name] ([id number]) Visit Report for 10/04/2020 Chief Complaint Document Details Patient Name: Dietz, [name] Date of Service: 10/04/2020 12:45 PM Medical Record Number: [id number] Patient Account Number: [id number] Date of Birth/Sex: 1970-11-14 (50 y.o. F) Treating RN: Dolan Amen Primary Care Provider: Margurite Auerbach Other Clinician: Referring Provider: Margurite Auerbach Treating Provider/Extender: Jeri Cos Weeks in Treatment: 0 Information Obtained from: Patient Chief Complaint Right 5th toe ulcer Electronic Signature(s) Signed: 10/04/2020 1:58:10 PM By: Worthy Keeler PA-C Entered By: Worthy Keeler on 10/04/2020 13:58:09 Cadman, [name] ([id number]) -------------------------------------------------------------------------------- Debridement Details Patient Name: [name] Date of Service: 10/04/2020 12:45 PM Medical Record Number: [id number] Patient Account Number: [id number] Date of Birth/Sex: 10/17/1970 (50 y.o. F) Treating RN: Dolan Amen Primary Care Provider: Margurite Auerbach Other Clinician: Referring Provider: Margurite Auerbach Treating Provider/Extender: Jeri Cos Weeks in Treatment: 0 Debridement Performed for Wound #1 Right Toe Fifth Assessment: Performed By: Physician Tommie Sams., PA-C Debridement Type: Chemical/Enzymatic/Mechanical Agent Used: saline gauze Severity of Tissue Pre Debridement: Fat layer exposed Level of Consciousness (Pre- Awake and Alert procedure): Pre-procedure Verification/Time Out Yes - 14:03 Taken: Start Time: 14:03 Instrument: Other : gauze Bleeding: None Response to Treatment: Procedure was tolerated well Level of Consciousness (Post- Awake and Alert procedure): Post Debridement Measurements of Total Wound Length: (cm) 0.7 Width: (cm) 0.7 Depth: (cm) 1 Volume: (cm) 0.385 Character of Wound/Ulcer Post Debridement: Stable Severity of Tissue Post Debridement: Fat layer exposed Post Procedure  Diagnosis Same as Pre-procedure Electronic Signature(s) Signed: 10/04/2020 5:01:16 PM By: Dolan Amen RN Signed: 10/05/2020 5:38:02 PM By: Worthy Keeler PA-C Entered By: Dolan Amen on 10/04/2020 14:03:39 Murguia, [name] ([id number]) -------------------------------------------------------------------------------- HPI Details Patient Name: [name] Date of Service: 10/04/2020 12:45 PM Medical Record Number: [id number] Patient Account Number: [id number] Date of Birth/Sex: Sep 10, 1970 (50 y.o. F) Treating RN: Dolan Amen Primary Care Provider: Margurite Auerbach Other Clinician: Referring Provider: Margurite Auerbach Treating Provider/Extender: Jeri Cos Weeks in Treatment: 0 History of Present Illness HPI Description: 10/04/2020 upon evaluation today patient presents for initial inspection here in the clinic concerning a fairly concerning wound over her right fifth toe towards the medial aspect which actually probes all the way down to bone. There is evidence of necrotic muscle as well and I do see tendon exposed as well. Subsequently this does have me very concerned about where things stand here. Again this is the first time that I am seeing her and on top of everything else her ABIs are noncompressible. I think that arterial flow is the primary concern here. Of note the patient states this was first noted July 1. She had a x-ray on July 4 which was negative for bone infection. She has been on 2 antibiotics she is unsure of the name. With that being said her most recent hemoglobin A1c was 8.1 that was December 2022. She otherwise does have a history obviously of diabetes mellitus type 2, hypertension, end-stage renal disease with dependence on renal dialysis, and she is also dependent on supplemental oxygen. Electronic Signature(s) Signed: 10/05/2020 5:34:41 PM By: Worthy Keeler PA-C Entered By: Worthy Keeler on 10/05/2020 17:34:41 Mcbryar, [name]  ([id number]) -------------------------------------------------------------------------------- Physical Exam Details Patient Name: [name] Date of Service: 10/04/2020 12:45 PM Medical Record Number: [id number] Patient Account Number: [id number] Date of Birth/Sex: 09/04/1970 (50 y.o. F) Treating RN: Dolan Amen Primary Care Provider: Margurite Auerbach Other Clinician: Referring Provider: Margurite Auerbach Treating Provider/Extender: Jeri Cos Weeks in Treatment: 0 Constitutional sitting or  standing blood pressure is within target range for patient.. pulse regular and within target range for patient.[name] respirations regular, non- labored and within target range for patient.[name] temperature within target range for patient.. Well-nourished and well-hydrated in no acute distress. Eyes conjunctiva clear no eyelid edema noted. pupils equal round and reactive to light and accommodation. Ears, Nose, Mouth, and Throat no gross abnormality of ear auricles or external auditory canals. normal hearing noted during conversation. mucus membranes moist. Respiratory normal breathing without difficulty. Cardiovascular Absent posterior tibial and dorsalis pedis pulses bilateral lower extremities. trace pitting edema of the bilateral lower extremities. Musculoskeletal normal gait and posture. no significant deformity or arthritic changes, no loss or range of motion, no clubbing. Psychiatric this patient is able to make decisions and demonstrates good insight into disease process. Alert and Oriented x 3. pleasant and cooperative. Notes Upon inspection patient's wound bed actually showed signs of necrotic debris in the surface of the wound. With that being said I am not going to. Performing any type of sharp debridement today due to the fact that the patient unfortunately has questionable arterial flow. I think before we consider anything of that nature we need to have arterial studies and she is in  agreement with that plan. I do not even get good pulses when I am filling over her foot and to be honest I feel like that she actually does have diminished capillary refill as well lasting greater than 3 seconds to refill. Electronic Signature(s) Signed: 10/05/2020 5:35:21 PM By: Worthy Keeler PA-C Entered By: Worthy Keeler on 10/05/2020 17:35:20 Cuello, [name] ([id number]) -------------------------------------------------------------------------------- Physician Orders Details Patient Name: Mccarver, [name] Date of Service: 10/04/2020 12:45 PM Medical Record Number: [id number] Patient Account Number: [id number] Date of Birth/Sex: [birth year] (50 y.o. F) Treating RN: Dolan Amen Primary Care Provider: Margurite Auerbach Other Clinician: Referring Provider: Margurite Auerbach Treating Provider/Extender: Jeri Cos Weeks in Treatment: 0 Verbal / Phone Orders: No Diagnosis Coding ICD-10 Coding Code Description E11.621 Type 2 diabetes mellitus with foot ulcer L97.513 Non-pressure chronic ulcer of other part of right foot with necrosis of muscle I50.42 Chronic combined systolic (congestive) and diastolic (congestive) heart failure I10 Essential (primary) hypertension N18.6 End stage renal disease Z99.2 Dependence on renal dialysis Z99.81 Dependence on supplemental oxygen Follow-up Appointments o Return Appointment in 1 week. Bathing/ Shower/ Hygiene o May shower; gently cleanse wound with antibacterial soap, rinse and pat dry prior to dressing wounds Wound Treatment Wound #1 - Toe Fifth Wound Laterality: Right Cleanser: Byram Ancillary Kit - 15 Day Supply (DME) (Generic) 1 x Per Day/30 Days Discharge Instructions: Use supplies as instructed; Kit contains: (15) Saline Bullets; (15) 3x3 Gauze; 15 pr Gloves Cleanser: Normal Saline 1 x Per Day/30 Days Discharge Instructions: Wash your hands with soap and water. Remove old dressing, discard into plastic bag and place into  trash. Cleanse the wound with Normal Saline prior to applying a clean dressing using gauze sponges, not tissues or cotton balls. Do not scrub or use excessive force. Pat dry using gauze sponges, not tissue or cotton balls. Primary Dressing: Gauze (DME) (Generic) 1 x Per Day/30 Days Discharge Instructions: Secure alginate with dry gauze Primary Dressing: Silvercel Small 2x2 (in/in) 1 x Per Day/30 Days Discharge Instructions: Apply Silvercel Small 2x2 (in/in) as instructed Secondary Dressing: Conforming Guaze Roll-Small (DME) (Generic) 1 x Per Day/30 Days Discharge Instructions: Apply Conforming Stretch Guaze Bandage as directed Secured With: 25M Medipore H Soft Cloth Surgical Tape, 2x2 (in/yd) (DME) (Generic) 1 x  Per Day/30 Days Discharge Instructions: Secure dressing Radiology o MRI without Contrast - Right foot, special focus on fifth toe Services and Therapies o Arterial Studies- Bilateral - AVVS Electronic Signature(s) Signed: 10/04/2020 5:01:16 PM By: Dolan Amen RN Signed: 10/05/2020 5:38:02 PM By: Worthy Keeler PA-C Entered By: Dolan Amen on 10/04/2020 14:16:31 Turnage, [name] ([id number]) [place], [name] ([id number]) -------------------------------------------------------------------------------- Problem List Details Patient Name: Cassady, [name] Date of Service: 10/04/2020 12:45 PM Medical Record Number: [id number] Patient Account Number: [id number] Date of Birth/Sex: 01-06-1971 (50 y.o. F) Treating RN: Dolan Amen Primary Care Provider: Margurite Auerbach Other Clinician: Referring Provider: Margurite Auerbach Treating Provider/Extender: Jeri Cos Weeks in Treatment: 0 Active Problems ICD-10 Encounter Code Description Active Date MDM Diagnosis E11.621 Type 2 diabetes mellitus with foot ulcer 10/04/2020 No Yes L97.513 Non-pressure chronic ulcer of other part of right foot with necrosis of 10/04/2020 No Yes muscle I50.42 Chronic combined systolic (congestive)  and diastolic (congestive) heart 10/04/2020 No Yes failure I10 Essential (primary) hypertension 10/04/2020 No Yes N18.6 End stage renal disease 10/04/2020 No Yes Z99.2 Dependence on renal dialysis 10/04/2020 No Yes Z99.81 Dependence on supplemental oxygen 10/04/2020 No Yes Inactive Problems Resolved Problems Electronic Signature(s) Signed: 10/04/2020 1:57:43 PM By: Worthy Keeler PA-C Entered By: Worthy Keeler on 10/04/2020 13:57:42 Elizarraraz, [name] ([id number]) -------------------------------------------------------------------------------- Progress Note Details Patient Name: Pardoe, [name] Date of Service: 10/04/2020 12:45 PM Medical Record Number: [id number] Patient Account Number: [id number] Date of Birth/Sex: Jul 10, 1970 (50 y.o. F) Treating RN: Dolan Amen Primary Care Provider: Margurite Auerbach Other Clinician: Referring Provider: Margurite Auerbach Treating Provider/Extender: Jeri Cos Weeks in Treatment: 0 Subjective Chief Complaint Information obtained from Patient Right 5th toe ulcer History of Present Illness (HPI) 10/04/2020 upon evaluation today patient presents for initial inspection here in the clinic concerning a fairly concerning wound over her right fifth toe towards the medial aspect which actually probes all the way down to bone. There is evidence of necrotic muscle as well and I do see tendon exposed as well. Subsequently this does have me very concerned about where things stand here. Again this is the first time that I am seeing her and on top of everything else her ABIs are noncompressible. I think that arterial flow is the primary concern here. Of note the patient states this was first noted July 1. She had a x-ray on July 4 which was negative for bone infection. She has been on 2 antibiotics she is unsure of the name. With that being said her most recent hemoglobin A1c was 8.1 that was December 2022. She otherwise does have a history obviously of diabetes  mellitus type 2, hypertension, end-stage renal disease with dependence on renal dialysis, and she is also dependent on supplemental oxygen. Patient History Information obtained from Patient. Allergies Sulfa (Sulfonamide Antibiotics) Social History Never smoker, Alcohol Use - Rarely, Drug Use - Current History - marijuana, Caffeine Use - Daily. Medical History Eyes Patient has history of Cataracts Ear/Nose/Mouth/Throat Denies history of Chronic sinus problems/congestion, Middle ear problems Hematologic/Lymphatic Denies history of Anemia, Hemophilia, Human Immunodeficiency Virus, Lymphedema, Sickle Cell Disease Respiratory Patient has history of Asthma Cardiovascular Patient has history of Hypertension Endocrine Patient has history of Type II Diabetes Denies history of Type I Diabetes Genitourinary Patient has history of End Stage Renal Disease Integumentary (Skin) Denies history of History of Burn, History of pressure wounds Musculoskeletal Patient has history of Osteoarthritis Denies history of Gout, Rheumatoid Arthritis, Osteomyelitis Neurologic Patient has history of Neuropathy Denies  history of Dementia, Quadriplegia, Paraplegia, Seizure Disorder Patient is treated with Insulin. Blood sugar is tested. Review of Systems (ROS) Constitutional Symptoms (General Health) Denies complaints or symptoms of Fatigue, Fever, Chills, Marked Weight Change. Eyes Complains or has symptoms of Vision Changes. Denies complaints or symptoms of Dry Eyes, Glasses / Contacts. Ear/Nose/Mouth/Throat Denies complaints or symptoms of Difficult clearing ears, Sinusitis. Hematologic/Lymphatic Denies complaints or symptoms of Bleeding / Clotting Disorders, Human Immunodeficiency Virus. Respiratory Complains or has symptoms of Shortness of Breath. Payment, [name] ([id number]) Cardiovascular Denies complaints or symptoms of Chest pain, LE edema. Gastrointestinal Denies complaints or symptoms of  Frequent diarrhea, Nausea, Vomiting. Endocrine Denies complaints or symptoms of Hepatitis, Thyroid disease, Polydypsia (Excessive Thirst). Genitourinary Complains or has symptoms of Kidney failure/ Dialysis - 3 times a week. Denies complaints or symptoms of Incontinence/dribbling. Immunological Denies complaints or symptoms of Hives, Itching. Integumentary (Skin) Complains or has symptoms of Wounds. Denies complaints or symptoms of Bleeding or bruising tendency, Breakdown, Swelling. Musculoskeletal Denies complaints or symptoms of Muscle Pain, Muscle Weakness. Neurologic Denies complaints or symptoms of Numbness/parasthesias, Focal/Weakness. Psychiatric Complains or has symptoms of Anxiety. Denies complaints or symptoms of Claustrophobia. Objective Constitutional sitting or standing blood pressure is within target range for patient.. pulse regular and within target range for patient.[name] respirations regular, non- labored and within target range for patient.[name] temperature within target range for patient.. Well-nourished and well-hydrated in no acute distress. Vitals Time Taken: 12:54 PM, Height: 64 in, Source: Stated, Weight: 285 lbs, Source: Stated, BMI: 48.9, Temperature: 98.5 F, Pulse: 83 bpm, Respiratory Rate: 20 breaths/min, Blood Pressure: 119/67 mmHg, Capillary Blood Glucose: 127 mg/dl, Pulse Oximetry: 97 %. General Notes: pt unsteady, unable to obtain ht/wt Eyes conjunctiva clear no eyelid edema noted. pupils equal round and reactive to light and accommodation. Ears, Nose, Mouth, and Throat no gross abnormality of ear auricles or external auditory canals. normal hearing noted during conversation. mucus membranes moist. Respiratory normal breathing without difficulty. Cardiovascular Absent posterior tibial and dorsalis pedis pulses bilateral lower extremities. trace pitting edema of the bilateral lower extremities. Musculoskeletal normal gait and posture. no significant  deformity or arthritic changes, no loss or range of motion, no clubbing. Psychiatric this patient is able to make decisions and demonstrates good insight into disease process. Alert and Oriented x 3. pleasant and cooperative. General Notes: Upon inspection patient's wound bed actually showed signs of necrotic debris in the surface of the wound. With that being said I am not going to. Performing any type of sharp debridement today due to the fact that the patient unfortunately has questionable arterial flow. I think before we consider anything of that nature we need to have arterial studies and she is in agreement with that plan. I do not even get good pulses when I am filling over her foot and to be honest I feel like that she actually does have diminished capillary refill as well lasting greater than 3 seconds to refill. Integumentary (Hair, Skin) Wound #1 status is Open. Original cause of wound was Gradually Appeared. The date acquired was: 08/25/2020. The wound is located on the Right Toe Fifth. The wound measures 0.7cm length x 0.7cm width x 1cm depth; 0.385cm^2 area and 0.385cm^3 volume. There is bone, tendon, and Fat Layer (Subcutaneous Tissue) exposed. There is no tunneling or undermining noted. There is a large amount of serosanguineous drainage noted. There is small (1-33%) red granulation within the wound bed. There is a large (67-100%) amount of necrotic tissue within the wound bed  including Eschar. Labree, ROSSLYN PASION ([id number]) Assessment Active Problems ICD-10 Type 2 diabetes mellitus with foot ulcer Non-pressure chronic ulcer of other part of right foot with necrosis of muscle Chronic combined systolic (congestive) and diastolic (congestive) heart failure Essential (primary) hypertension End stage renal disease Dependence on renal dialysis Dependence on supplemental oxygen Procedures Wound #1 Pre-procedure diagnosis of Wound #1 is a Diabetic Wound/Ulcer of the Lower Extremity  located on the Right Toe Fifth .Severity of Tissue Pre Debridement is: Fat layer exposed. There was a Chemical/Enzymatic/Mechanical debridement performed by Tommie Sams., PA-C. With the following instrument(s): gauze. Other agent used was saline gauze. A time out was conducted at 14:03, prior to the start of the procedure. There was no bleeding. The procedure was tolerated well. Post Debridement Measurements: 0.7cm length x 0.7cm width x 1cm depth; 0.385cm^3 volume. Character of Wound/Ulcer Post Debridement is stable. Severity of Tissue Post Debridement is: Fat layer exposed. Post procedure Diagnosis Wound #1: Same as Pre-Procedure Plan Follow-up Appointments: Return Appointment in 1 week. Bathing/ Shower/ Hygiene: May shower; gently cleanse wound with antibacterial soap, rinse and pat dry prior to dressing wounds Services and Therapies ordered were: Arterial Studies- Bilateral - AVVS Radiology ordered were: MRI without Contrast - Right foot, special focus on fifth toe WOUND #1: - Toe Fifth Wound Laterality: Right Cleanser: Byram Ancillary Kit - 15 Day Supply (DME) (Generic) 1 x Per Day/30 Days Discharge Instructions: Use supplies as instructed; Kit contains: (15) Saline Bullets; (15) 3x3 Gauze; 15 pr Gloves Cleanser: Normal Saline 1 x Per Day/30 Days Discharge Instructions: Wash your hands with soap and water. Remove old dressing, discard into plastic bag and place into trash. Cleanse the wound with Normal Saline prior to applying a clean dressing using gauze sponges, not tissues or cotton balls. Do not scrub or use excessive force. Pat dry using gauze sponges, not tissue or cotton balls. Primary Dressing: Gauze (DME) (Generic) 1 x Per Day/30 Days Discharge Instructions: Secure alginate with dry gauze Primary Dressing: Silvercel Small 2x2 (in/in) 1 x Per Day/30 Days Discharge Instructions: Apply Silvercel Small 2x2 (in/in) as instructed Secondary Dressing: Conforming Guaze Roll-Small  (DME) (Generic) 1 x Per Day/30 Days Discharge Instructions: Apply Conforming Stretch Guaze Bandage as directed Secured With: 52M Medipore H Soft Cloth Surgical Tape, 2x2 (in/yd) (DME) (Generic) 1 x Per Day/30 Days Discharge Instructions: Secure dressing 1. I would recommend currently that we go ahead and put in an order stat for arterial studies with ABI TBI hopefully we get this scheduled as soon as possible. 2. I am also can recommend at this time that we begin with the approval process for an MRI as well as I feel like this patient has a very light high likelihood of osteomyelitis. 3. I am also can recommend at this time that we have the patient go ahead and initiate treatment with a silver alginate dressing which I think is probably the best way to go. We will use gauze followed by roll gauze to secure in place. We will see patient back for reevaluation in 1 week here in the clinic. If anything worsens or changes patient will contact our office for additional recommendations. Meech, CYRENA KUCHENBECKER ([id number]) Electronic Signature(s) Signed: 10/05/2020 5:37:17 PM By: Worthy Keeler PA-C Entered By: Worthy Keeler on 10/05/2020 17:37:16 Freeze, [name] ([id number]) -------------------------------------------------------------------------------- ROS/PFSH Details Patient Name: Hollingworth, [name] Date of Service: 10/04/2020 12:45 PM Medical Record Number: [id number] Patient Account Number: [id number] Date of Birth/Sex: [birth year] (50 y.o. F) Treating  RN: Dolan Amen Primary Care Provider: Margurite Auerbach Other Clinician: Referring Provider: Margurite Auerbach Treating Provider/Extender: Jeri Cos Weeks in Treatment: 0 Information Obtained From Patient Constitutional Symptoms (General Health) Complaints and Symptoms: Negative for: Fatigue; Fever; Chills; Marked Weight Change Eyes Complaints and Symptoms: Positive for: Vision Changes Negative for: Dry Eyes; Glasses / Contacts Medical  History: Positive for: Cataracts Ear/Nose/Mouth/Throat Complaints and Symptoms: Negative for: Difficult clearing ears; Sinusitis Medical History: Negative for: Chronic sinus problems/congestion; Middle ear problems Hematologic/Lymphatic Complaints and Symptoms: Negative for: Bleeding / Clotting Disorders; Human Immunodeficiency Virus Medical History: Negative for: Anemia; Hemophilia; Human Immunodeficiency Virus; Lymphedema; Sickle Cell Disease Respiratory Complaints and Symptoms: Positive for: Shortness of Breath Medical History: Positive for: Asthma Cardiovascular Complaints and Symptoms: Negative for: Chest pain; LE edema Medical History: Positive for: Hypertension Gastrointestinal Complaints and Symptoms: Negative for: Frequent diarrhea; Nausea; Vomiting Endocrine Complaints and Symptoms: Negative for: Hepatitis; Thyroid disease; Polydypsia (Excessive Thirst) Medical History: Positive for: Type II Diabetes Yazdi, Fe M. ([id number]) Negative for: Type I Diabetes Time with diabetes: 29 years Treated with: Insulin Blood sugar tested every day: Yes Tested : Genitourinary Complaints and Symptoms: Positive for: Kidney failure/ Dialysis - 3 times a week Negative for: Incontinence/dribbling Medical History: Positive for: End Stage Renal Disease Immunological Complaints and Symptoms: Negative for: Hives; Itching Integumentary (Skin) Complaints and Symptoms: Positive for: Wounds Negative for: Bleeding or bruising tendency; Breakdown; Swelling Medical History: Negative for: History of Burn; History of pressure wounds Musculoskeletal Complaints and Symptoms: Negative for: Muscle Pain; Muscle Weakness Medical History: Positive for: Osteoarthritis Negative for: Gout; Rheumatoid Arthritis; Osteomyelitis Neurologic Complaints and Symptoms: Negative for: Numbness/parasthesias; Focal/Weakness Medical History: Positive for: Neuropathy Negative for: Dementia;  Quadriplegia; Paraplegia; Seizure Disorder Psychiatric Complaints and Symptoms: Positive for: Anxiety Negative for: Claustrophobia Oncologic HBO Extended History Items Eyes: Cataracts Immunizations Pneumococcal Vaccine: Received Pneumococcal Vaccination: No Implantable Devices None Family and Social History Savarese, NADYNE GARIEPY. ([id number]) Never smoker; Alcohol Use: Rarely; Drug Use: Current History - marijuana; Caffeine Use: Daily Electronic Signature(s) Signed: 10/04/2020 5:01:16 PM By: Dolan Amen RN Signed: 10/05/2020 5:38:02 PM By: Worthy Keeler PA-C Entered By: Dolan Amen on 10/04/2020 13:03:52 Mauritz, [name] ([id number]) -------------------------------------------------------------------------------- SuperBill Details Patient Name: Longshore, [name] Date of Service: 10/04/2020 Medical Record Number: [id number] Patient Account Number: [id number] Date of Birth/Sex: Aug 31, 1970 (50 y.o. F) Treating RN: Dolan Amen Primary Care Provider: Margurite Auerbach Other Clinician: Referring Provider: Margurite Auerbach Treating Provider/Extender: Jeri Cos Weeks in Treatment: 0 Diagnosis Coding ICD-10 Codes Code Description E11.621 Type 2 diabetes mellitus with foot ulcer L97.513 Non-pressure chronic ulcer of other part of right foot with necrosis of muscle I50.42 Chronic combined systolic (congestive) and diastolic (congestive) heart failure I10 Essential (primary) hypertension N18.6 End stage renal disease Z99.2 Dependence on renal dialysis Z99.81 Dependence on supplemental oxygen Facility Procedures CPT4 Code: 85027741 Description: [place] VISIT-LEV 4 EST PT Modifier: Quantity: 1 Physician Procedures CPT4 Code: 2878676 Description: 72094 - WC PHYS LEVEL 4 - NEW PT Modifier: Quantity: 1 CPT4 Code: Description: ICD-10 Diagnosis Description E11.621 Type 2 diabetes mellitus with foot ulcer L97.513 Non-pressure chronic ulcer of other part of right foot  with necrosis of I50.42 Chronic combined systolic (congestive) and diastolic (congestive) heart I10  Essential (primary) hypertension Modifier: muscle failure Quantity: Electronic Signature(s) Signed: 10/05/2020 5:37:48 PM By: Worthy Keeler PA-C Previous Signature: 10/04/2020 5:01:16 PM Version By: Dolan Amen RN Entered By: Worthy Keeler on 10/05/2020 17:37:47

## 2020-10-05 NOTE — Progress Notes (Addendum)
ANGELISSE, RISO ([id number]) Visit Report for 10/04/2020 Allergy List Details Patient Name: [name], [name] Date of Service: 10/04/2020 12:45 PM Medical Record Number: [id number] Patient Account Number: [id number] Date of Birth/Sex: 03-13-1970 (50 y.o. F) Treating RN: Dolan Amen Primary Care Darold Miley: Margurite Auerbach Other Clinician: Referring Rockland Kotarski: Margurite Auerbach Treating Kobyn Kray/Extender: Jeri Cos Weeks in Treatment: 0 Allergies Active Allergies Sulfa (Sulfonamide Antibiotics) Allergy Notes Electronic Signature(s) Signed: 10/04/2020 5:01:16 PM By: Dolan Amen RN Entered By: Dolan Amen on 10/04/2020 13:00:06 [name], [name] ([id number]) -------------------------------------------------------------------------------- Arrival Information Details Patient Name: [name], [name] Date of Service: 10/04/2020 12:45 PM Medical Record Number: [id number] Patient Account Number: [id number] Date of Birth/Sex: 1970-10-18 (50 y.o. F) Treating RN: Dolan Amen Primary Care Hendy Brindle: Margurite Auerbach Other Clinician: Referring Kahlen Morais: Margurite Auerbach Treating Jannat Rosemeyer/Extender: Jeri Cos Weeks in Treatment: 0 Visit Information Patient Arrived: Wheel Chair Arrival Time: 12:54 Accompanied By: self Transfer Assistance: Manual Patient Identification Verified: Yes Secondary Verification Process Completed: Yes Patient Has Alerts: Yes Patient Alerts: Type 2 Diabetic Electronic Signature(s) Signed: 10/04/2020 5:01:16 PM By: Dolan Amen RN Entered By: Dolan Amen on 10/04/2020 13:35:38 [name], [name] ([id number]) -------------------------------------------------------------------------------- Clinic Level of Care Assessment Details Patient Name: [name] Date of Service: 10/04/2020 12:45 PM Medical Record Number: [id number] Patient Account Number: [id number] Date of Birth/Sex: [birth year] (50 y.o. F) Treating RN: Dolan Amen Primary Care Cheila Wickstrom:  Margurite Auerbach Other Clinician: Referring Eann Cleland: Margurite Auerbach Treating Sherl Yzaguirre/Extender: Jeri Cos Weeks in Treatment: 0 Clinic Level of Care Assessment Items TOOL 2 Quantity Score X - Use when only an EandM is performed on the INITIAL visit 1 0 ASSESSMENTS - Nursing Assessment / Reassessment X - General Physical Exam (combine w/ comprehensive assessment (listed just below) when performed on new 1 20 pt. evals) X- 1 25 Comprehensive Assessment (HX, ROS, Risk Assessments, Wounds Hx, etc.) ASSESSMENTS - Wound and Skin Assessment / Reassessment X - Simple Wound Assessment / Reassessment - one wound 1 5 []  - 0 Complex Wound Assessment / Reassessment - multiple wounds []  - 0 Dermatologic / Skin Assessment (not related to wound area) ASSESSMENTS - Ostomy and/or Continence Assessment and Care []  - Incontinence Assessment and Management 0 []  - 0 Ostomy Care Assessment and Management (repouching, etc.) PROCESS - Coordination of Care X - Simple Patient / Family Education for ongoing care 1 15 []  - 0 Complex (extensive) Patient / Family Education for ongoing care []  - 0 Staff obtains Programmer, systems, Records, Test Results / Process Orders []  - 0 Staff telephones HHA, Nursing Homes / Clarify orders / etc []  - 0 Routine Transfer to another Facility (non-emergent condition) []  - 0 Routine Hospital Admission (non-emergent condition) []  - 0 New Admissions / Biomedical engineer / Ordering NPWT, Apligraf, etc. []  - 0 Emergency Hospital Admission (emergent condition) X- 1 10 Simple Discharge Coordination []  - 0 Complex (extensive) Discharge Coordination PROCESS - Special Needs []  - Pediatric / Minor Patient Management 0 []  - 0 Isolation Patient Management []  - 0 Hearing / Language / Visual special needs []  - 0 Assessment of Community assistance (transportation, D/C planning, etc.) []  - 0 Additional assistance / Altered mentation []  - 0 Support Surface(s) Assessment (bed,  cushion, seat, etc.) INTERVENTIONS - Wound Cleansing / Measurement X - Wound Imaging (photographs - any number of wounds) 1 5 []  - 0 Wound Tracing (instead of photographs) X- 1 5 Simple Wound Measurement - one wound []  - 0 Complex Wound Measurement - multiple wounds Schoneman, Veatrice M. ([id number])  X- 1 5 Simple Wound Cleansing - one wound []  - 0 Complex Wound Cleansing - multiple wounds INTERVENTIONS - Wound Dressings []  - Small Wound Dressing one or multiple wounds 0 X- 1 15 Medium Wound Dressing one or multiple wounds []  - 0 Large Wound Dressing one or multiple wounds []  - 0 Application of Medications - injection INTERVENTIONS - Miscellaneous []  - External ear exam 0 []  - 0 Specimen Collection (cultures, biopsies, blood, body fluids, etc.) []  - 0 Specimen(s) / Culture(s) sent or taken to Lab for analysis []  - 0 Patient Transfer (multiple staff / Civil Service fast streamer / Similar devices) []  - 0 Simple Staple / Suture removal (25 or less) []  - 0 Complex Staple / Suture removal (26 or more) []  - 0 Hypo / Hyperglycemic Management (close monitor of Blood Glucose) X- 1 15 Ankle / Brachial Index (ABI) - do not check if billed separately Has the patient been seen at the hospital within the last three years: Yes Total Score: 120 Level Of Care: New/Established - Level 4 Electronic Signature(s) Signed: 10/04/2020 5:01:16 PM By: Dolan Amen RN Entered By: Dolan Amen on 10/04/2020 14:20:48 [name], [name] ([id number]) -------------------------------------------------------------------------------- Lower Extremity Assessment Details Patient Name: [name] Date of Service: 10/04/2020 12:45 PM Medical Record Number: [id number] Patient Account Number: [id number] Date of Birth/Sex: 14-Apr-1970 (50 y.o. F) Treating RN: Dolan Amen Primary Care Nyaja Dubu[place]: Margurite Auerbach Other Clinician: Referring Nikoloz Huy: Margurite Auerbach Treating Elizaveta Mattice/Extender: Jeri Cos Weeks in  Treatment: 0 Edema Assessment Assessed: [Left: Yes] [Right: Yes] Edema: [Left: No] [Right: No] Vascular Assessment Pulses: Dorsalis Pedis Palpable: [Left:Yes] [Right:Yes] Doppler Audible: [Right:Yes] Posterior Tibial Palpable: [Right:No Yes] Notes pt Non compressible in office today Electronic Signature(s) Signed: 10/04/2020 5:01:16 PM By: Dolan Amen RN Entered By: Dolan Amen on 10/04/2020 13:35:11 [name], [name] ([id number]) -------------------------------------------------------------------------------- Multi Wound Chart Details Patient Name: Zawistowski, [name] Date of Service: 10/04/2020 12:45 PM Medical Record Number: [id number] Patient Account Number: [id number] Date of Birth/Sex: 07-28-1970 (50 y.o. F) Treating RN: Dolan Amen Primary Care Mechelle Pates: Margurite Auerbach Other Clinician: Referring Jasslyn Finkel: Margurite Auerbach Treating Ritamarie Arkin/Extender: Jeri Cos Weeks in Treatment: 0 Vital Signs Height(in): 64 Capillary Blood Glucose 127 (mg/dl): Weight(lbs): 285 Pulse(bpm): 83 Body Mass Index(BMI): 70 Blood Pressure(mmHg): 119/67 Temperature(F): 98.5 Respiratory Rate(breaths/min): 20 Photos: [N/A:N/A] Wound Location: Right Toe Fifth N/A N/A Wounding Event: Gradually Appeared N/A N/A Primary Etiology: Diabetic Wound/Ulcer of the Lower N/A N/A Extremity Comorbid History: Cataracts, Asthma, Hypertension, N/A N/A Type II Diabetes, End Stage Renal Disease, Osteoarthritis, Neuropathy Date Acquired: 08/25/2020 N/A N/A Weeks of Treatment: 0 N/A N/A Wound Status: Open N/A N/A Pending Amputation on Yes N/A N/A Presentation: Measurements L x W x D (cm) 0.7x0.7x1 N/A N/A Area (cm) : 0.385 N/A N/A Volume (cm) : 0.385 N/A N/A Classification: Grade 2 N/A N/A Exudate Amount: Large N/A N/A Exudate Type: Serosanguineous N/A N/A Exudate Color: red, brown N/A N/A Granulation Amount: Small (1-33%) N/A N/A Granulation Quality: Red N/A N/A Necrotic Amount: Large  (67-100%) N/A N/A Necrotic Tissue: Eschar N/A N/A Exposed Structures: Fat Layer (Subcutaneous Tissue): N/A N/A Yes Tendon: Yes Bone: Yes Fascia: No Muscle: No Joint: No Epithelialization: None N/A N/A Treatment Notes Electronic Signature(s) Signed: 10/04/2020 5:01:16 PM By: Dolan Amen RN Entered By: Dolan Amen on 10/04/2020 14:02:00 [name], [name] ([id number]) -------------------------------------------------------------------------------- Multi-Disciplinary Care Plan Details Patient Name: [name], [name] Date of Service: 10/04/2020 12:45 PM Medical Record Number: [id number] Patient Account Number: [id number] Date of Birth/Sex: 03/28/1970 (  50 y.o. F) Treating RN: Dolan Amen Primary Care Rahiem Schellinger: Margurite Auerbach Other Clinician: Referring Cerra Eisenhower: Margurite Auerbach Treating Vermon Grays/Extender: Jeri Cos Weeks in Treatment: 0 Active Inactive Electronic Signature(s) Signed: 10/24/2020 6:42:39 PM By: Gretta Cool, BSN, RN, CWS, Kim RN, BSN Signed: 10/26/2020 4:48:47 PM By: Dolan Amen RN Previous Signature: 10/24/2020 6:42:26 PM Version By: Gretta Cool BSN, RN, CWS, Kim RN, BSN Previous Signature: 10/04/2020 5:01:16 PM Version By: Dolan Amen RN Entered By: Gretta Cool BSN, RN, CWS, Kim on 10/24/2020 18:42:39 [name], [name] ([id number]) -------------------------------------------------------------------------------- Pain Assessment Details Patient Name: [name], [name] Date of Service: 10/04/2020 12:45 PM Medical Record Number: [id number] Patient Account Number: [id number] Date of Birth/Sex: [birth year] (50 y.o. F) Treating RN: Dolan Amen Primary Care Garlin Batdorf: Margurite Auerbach Other Clinician: Referring Noelia Lenart: Margurite Auerbach Treating Johsua [name]/Extender: Jeri Cos Weeks in Treatment: 0 Active Problems Location of Pain Severity and Description of Pain Patient Has Paino Yes Site Locations Rate the pain. Current Pain Level: 7 Pain Management and  Medication Current Pain Management: Electronic Signature(s) Signed: 10/04/2020 5:01:16 PM By: Dolan Amen RN Entered By: Dolan Amen on 10/04/2020 12:54:51 [name], [name] ([id number]) -------------------------------------------------------------------------------- Patient/Caregiver Education Details Patient Name: [name], [name] Date of Service: 10/04/2020 12:45 PM Medical Record Number: [id number] Patient Account Number: [id number] Date of Birth/Gender: 05/09/1970 (50 y.o. F) Treating RN: Dolan Amen Primary Care Physician: Margurite Auerbach Other Clinician: Referring Physician: Margurite Auerbach Treating Physician/Extender: Skipper Cliche in Treatment: 0 Education Assessment Education Provided To: Patient Education Topics Provided Welcome To [facility]: Methods: Explain/Verbal Responses: State content correctly Wound/Skin Impairment: Methods: Explain/Verbal Responses: State content correctly Electronic Signature(s) Signed: 10/04/2020 5:01:16 PM By: Dolan Amen RN Entered By: Dolan Amen on 10/04/2020 14:21:05 [name], [name] ([id number]) -------------------------------------------------------------------------------- Wound Assessment Details Patient Name: [name], [name] Date of Service: 10/04/2020 12:45 PM Medical Record Number: [id number] Patient Account Number: [id number] Date of Birth/Sex: [birth year] (50 y.o. F) Treating RN: Dolan Amen Primary Care Lindzie Boxx: Margurite Auerbach Other Clinician: Referring Hamdi Vari: Margurite Auerbach Treating Suraj Ramdass/Extender: Jeri Cos Weeks in Treatment: 0 Wound Status Wound Number: 1 Primary Diabetic Wound/Ulcer of the Lower Extremity Etiology: Wound Location: Right Toe Fifth Wound Amputation Wounding Event: Gradually Appeared Status: Date Acquired: 08/25/2020 Outcome Digit/Ray Weeks Of Treatment: 0 Level: Clustered Wound: No Comorbid Cataracts, Asthma, Hypertension, Type II Diabetes,  End Pending Amputation On Presentation History: Stage Renal Disease, Osteoarthritis, Neuropathy Photos Wound Measurements Length: (cm) 0.7 Width: (cm) 0.7 Depth: (cm) 1 Area: (cm) 0.385 Volume: (cm) 0.385 % Reduction in Area: 0% % Reduction in Volume: 0% Epithelialization: None Tunneling: No Undermining: No Wound Description Classification: Grade 2 Exudate Amount: Large Exudate Type: Serosanguineous Exudate Color: red, brown Foul Odor After Cleansing: No Slough/Fibrino Yes Wound Bed Granulation Amount: Small (1-33%) Exposed Structure Granulation Quality: Red Fascia Exposed: No Necrotic Amount: Large (67-100%) Fat Layer (Subcutaneous Tissue) Exposed: Yes Necrotic Quality: Eschar Tendon Exposed: Yes Muscle Exposed: No Joint Exposed: No Bone Exposed: Yes Electronic Signature(s) Signed: 10/24/2020 6:41:21 PM By: Gretta Cool, BSN, RN, CWS, Kim RN, BSN Signed: 10/26/2020 4:48:47 PM By: Dolan Amen RN Previous Signature: 10/04/2020 5:01:16 PM Version By: Dolan Amen RN Entered By: Gretta Cool, BSN, RN, CWS, Kim on 10/24/2020 18:41:21 [name], [name] ([id number]) -------------------------------------------------------------------------------- [place] Details Patient Name: Silberman, [name] Date of Service: 10/04/2020 12:45 PM Medical Record Number: [id number] Patient Account Number: [id number] Date of Birth/Sex: February 12, 1971 (50 y.o. F) Treating RN: Dolan Amen Primary Care Chivonne Rascon: Margurite Auerbach Other Clinician: Referring Naphtali Riede: Margurite Auerbach Treating  Blu Mcglaun/Extender: Jeri Cos Weeks in Treatment: 0 Vital Signs Time Taken: 12:54 Temperature (F): 98.5 Height (in): 64 Pulse (bpm): 83 Source: Stated Respiratory Rate (breaths/min): 20 Weight (lbs): 285 Blood Pressure (mmHg): 119/67 Source: Stated Capillary Blood Glucose (mg/dl): 127 Body Mass Index (BMI): 48.9 Reference Range: 80 - 120 mg / dl Airway Pulse Oximetry (%): 97 Inhaled Oxygen Concentration (%):  3 Notes pt unsteady, unable to obtain ht/wt Electronic Signature(s) Signed: 10/04/2020 5:01:16 PM By: Dolan Amen RN Entered By: Dolan Amen on 10/04/2020 12:58:21

## 2020-10-05 NOTE — Progress Notes (Signed)
TERRIS, BODIN ([id number]) Visit Report for 10/04/2020 Abuse/Suicide Risk Screen Details Patient Name: [name], DRUE CAMERA. Date of Service: 10/04/2020 12:45 PM Medical Record Number: [id number] Patient Account Number: [id number] Date of Birth/Sex: 05-14-1970 (50 y.o. F) Treating RN: Dolan Amen Primary Care Gearld Kerstein: Margurite Auerbach Other Clinician: Referring Loni Delbridge: Charlynn Grimes, [place] Treating Nimrit Kehres/Extender: Jeri Cos Weeks in Treatment: 0 Abuse/Suicide Risk Screen Items Answer ABUSE RISK SCREEN: Has anyone close to you tried to hurt or harm you recentlyo No Do you feel uncomfortable with anyone in your familyo No Has anyone forced you do things that you didnot want to doo No Electronic Signature(s) Signed: 10/04/2020 5:01:16 PM By: Dolan Amen RN Entered By: Dolan Amen on 10/04/2020 13:03:57 [place]ord, [name] ([id number]) -------------------------------------------------------------------------------- Activities of Daily Living Details Patient Name: Romick, [name] Date of Service: 10/04/2020 12:45 PM Medical Record Number: [id number] Patient Account Number: [id number] Date of Birth/Sex: [birth year] (50 y.o. F) Treating RN: Dolan Amen Primary Care Floyde Dingley: Margurite Auerbach Other Clinician: Referring Senia Even: Margurite Auerbach Treating Meylin Stenzel/Extender: Jeri Cos Weeks in Treatment: 0 Activities of Daily Living Items Answer Activities of Daily Living (Please select one for each item) Drive Automobile Not Able Take Medications Completely Able Use Telephone Completely Able Care for Appearance Need Assistance Use Toilet Need Assistance Bath / Shower Need Assistance Dress Self Need Assistance Feed Self Completely Able Walk Need Assistance Get In / Out Bed Need Assistance Housework Need Assistance Prepare Meals Completely [place] for Self Completely Able Electronic Signature(s) Signed: 10/04/2020 5:01:16 PM By: Dolan Amen RN Entered By: Dolan Amen on 10/04/2020 13:04:40 [place], [name] ([id number]) -------------------------------------------------------------------------------- Education Screening Details Patient Name: Lemar, [name] Date of Service: 10/04/2020 12:45 PM Medical Record Number: [id number] Patient Account Number: [id number] Date of Birth/Sex: [birth year] (50 y.o. F) Treating RN: Dolan Amen Primary Care Ludella Pranger: Margurite Auerbach Other Clinician: Referring Kenidee Cregan: Margurite Auerbach Treating Tyrena Gohr/Extender: Jeri Cos Weeks in Treatment: 0 Primary Learner Assessed: Patient Learning Preferences/Education Level/Primary Language Learning Preference: Explanation, Demonstration Highest Education Level: College or Above Preferred Language: English Cognitive Barrier Language Barrier: No Translator Needed: No Memory Deficit: No Emotional Barrier: No Cultural/Religious Beliefs Affecting Medical Care: No Physical Barrier Impaired Vision: No Impaired Hearing: No Decreased Hand dexterity: No Knowledge/Comprehension Knowledge Level: Medium Comprehension Level: Medium Ability to understand written instructions: Medium Ability to understand verbal instructions: Medium Motivation Anxiety Level: Calm Education Importance: Acknowledges Need Interest in Health Problems: Asks Questions Perception: Coherent Willingness to Engage in Self-Management Medium Activities: Readiness to Engage in Self-Management Medium Activities: Electronic Signature(s) Signed: 10/04/2020 5:01:16 PM By: Dolan Amen RN Entered By: Dolan Amen on 10/04/2020 13:05:01 Brion, [name] ([id number]) -------------------------------------------------------------------------------- Fall Risk Assessment Details Patient Name: Kirst, [name] Date of Service: 10/04/2020 12:45 PM Medical Record Number: [id number] Patient Account Number: [id number] Date of Birth/Sex: [birth year] (50 y.o. F) Treating RN:  Dolan Amen Primary Care Lona Six: Margurite Auerbach Other Clinician: Referring Adriannah Steinkamp: Margurite Auerbach Treating Placido Hangartner/Extender: Jeri Cos Weeks in Treatment: 0 Fall Risk Assessment Items Have you had 2 or more falls in the last 12 monthso 0 No Have you had any fall that resulted in injury in the last 12 monthso 0 No FALLS RISK SCREEN History of falling - immediate or within 3 months 0 No Secondary diagnosis (Do you have 2 or more medical diagnoseso) 15 Yes Ambulatory aid None/bed rest/wheelchair/nurse 0 No Crutches/cane/walker 15 Yes Furniture 0 No Intravenous therapy Access/Saline/Heparin Lock 0 No Gait/Transferring Normal/ bed rest/ wheelchair 0  No Weak (short steps with or without shuffle, stooped but able to lift head while walking, may 10 Yes seek support from furniture) Impaired (short steps with shuffle, may have difficulty arising from chair, head down, impaired 0 No balance) Mental Status Oriented to own ability 0 Yes Electronic Signature(s) Signed: 10/04/2020 5:01:16 PM By: Dolan Amen RN Entered By: Dolan Amen on 10/04/2020 13:05:16 Dellarocco, [name] ([id number]) -------------------------------------------------------------------------------- Foot Assessment Details Patient Name: Gick, [name] Date of Service: 10/04/2020 12:45 PM Medical Record Number: [id number] Patient Account Number: [id number] Date of Birth/Sex: 1970-07-01 (50 y.o. F) Treating RN: Dolan Amen Primary Care Ashtin Rosner: Margurite Auerbach Other Clinician: Referring Kaya Pottenger: Margurite Auerbach Treating Taneika Choi/Extender: Jeri Cos Weeks in Treatment: 0 Foot Assessment Items Site Locations + = Sensation present, - = Sensation absent, C = Callus, U = Ulcer R = Redness, W = Warmth, M = Maceration, PU = Pre-ulcerative lesion F = Fissure, S = Swelling, D = Dryness Assessment Right: Left: Other Deformity: No No Prior Foot Ulcer: No No Prior Amputation: No No Charcot Joint: No  No Ambulatory Status: Ambulatory With Help Assistance Device: Walker Gait: Administrator, arts) Signed: 10/04/2020 5:01:16 PM By: Dolan Amen RN Entered By: Dolan Amen on 10/04/2020 13:12:36 Skluzacek, [name] ([id number]) -------------------------------------------------------------------------------- Nutrition Risk Screening Details Patient Name: Overley, [name] Date of Service: 10/04/2020 12:45 PM Medical Record Number: [id number] Patient Account Number: [id number] Date of Birth/Sex: November 21, 1970 (50 y.o. F) Treating RN: Dolan Amen Primary Care Jamin Humphries: Margurite Auerbach Other Clinician: Referring Margene Cherian: Margurite Auerbach Treating Shizuye Rupert/Extender: Jeri Cos Weeks in Treatment: 0 Height (in): 64 Weight (lbs): 285 Body Mass Index (BMI): 48.9 Nutrition Risk Screening Items Score Screening NUTRITION RISK SCREEN: I have an illness or condition that made me change the kind and/or amount of food I eat 0 No I eat fewer than two meals per day 0 No I eat few fruits and vegetables, or milk products 0 No I have three or more drinks of beer, liquor or wine almost every day 0 No I have tooth or mouth problems that make it hard for me to eat 0 No I don't always have enough money to buy the food I need 0 No I eat alone most of the time 0 No I take three or more different prescribed or over-the-counter drugs a day 1 Yes Without wanting to, I have lost or gained 10 pounds in the last six months 0 No I am not always physically able to shop, cook and/or feed myself 0 No Nutrition Protocols Good Risk Protocol 0 No interventions needed Moderate Risk Protocol High Risk Proctocol Risk Level: Good Risk Score: 1 Electronic Signature(s) Signed: 10/04/2020 5:01:16 PM By: Dolan Amen RN Entered By: Dolan Amen on 10/04/2020 13:05:30

## 2020-10-06 ENCOUNTER — Other Ambulatory Visit (INDEPENDENT_AMBULATORY_CARE_PROVIDER_SITE_OTHER): Payer: Self-pay | Admitting: Physician Assistant

## 2020-10-06 DIAGNOSIS — I10 Essential (primary) hypertension: Secondary | ICD-10-CM | POA: Diagnosis not present

## 2020-10-06 DIAGNOSIS — E11621 Type 2 diabetes mellitus with foot ulcer: Secondary | ICD-10-CM | POA: Diagnosis not present

## 2020-10-06 DIAGNOSIS — I872 Venous insufficiency (chronic) (peripheral): Secondary | ICD-10-CM

## 2020-10-06 DIAGNOSIS — N186 End stage renal disease: Secondary | ICD-10-CM | POA: Diagnosis not present

## 2020-10-06 DIAGNOSIS — L97513 Non-pressure chronic ulcer of other part of right foot with necrosis of muscle: Secondary | ICD-10-CM

## 2020-10-06 DIAGNOSIS — Z9981 Dependence on supplemental oxygen: Secondary | ICD-10-CM | POA: Diagnosis not present

## 2020-10-06 DIAGNOSIS — Z992 Dependence on renal dialysis: Secondary | ICD-10-CM | POA: Diagnosis not present

## 2020-10-06 DIAGNOSIS — I5042 Chronic combined systolic (congestive) and diastolic (congestive) heart failure: Secondary | ICD-10-CM | POA: Diagnosis not present

## 2020-10-07 DIAGNOSIS — Z992 Dependence on renal dialysis: Secondary | ICD-10-CM | POA: Diagnosis not present

## 2020-10-07 DIAGNOSIS — N186 End stage renal disease: Secondary | ICD-10-CM | POA: Diagnosis not present

## 2020-10-09 ENCOUNTER — Other Ambulatory Visit: Payer: Self-pay

## 2020-10-09 DIAGNOSIS — R109 Unspecified abdominal pain: Secondary | ICD-10-CM | POA: Diagnosis not present

## 2020-10-09 DIAGNOSIS — K5900 Constipation, unspecified: Secondary | ICD-10-CM | POA: Diagnosis not present

## 2020-10-10 DIAGNOSIS — N186 End stage renal disease: Secondary | ICD-10-CM | POA: Diagnosis not present

## 2020-10-10 DIAGNOSIS — Z992 Dependence on renal dialysis: Secondary | ICD-10-CM | POA: Diagnosis not present

## 2020-10-12 DIAGNOSIS — Z992 Dependence on renal dialysis: Secondary | ICD-10-CM | POA: Diagnosis not present

## 2020-10-12 DIAGNOSIS — N186 End stage renal disease: Secondary | ICD-10-CM | POA: Diagnosis not present

## 2020-10-13 ENCOUNTER — Ambulatory Visit: Payer: Medicare HMO | Admitting: Physician Assistant

## 2020-10-14 DIAGNOSIS — N186 End stage renal disease: Secondary | ICD-10-CM | POA: Diagnosis not present

## 2020-10-14 DIAGNOSIS — Z992 Dependence on renal dialysis: Secondary | ICD-10-CM | POA: Diagnosis not present

## 2020-10-15 ENCOUNTER — Emergency Department: Payer: Medicare HMO

## 2020-10-15 ENCOUNTER — Other Ambulatory Visit: Payer: Self-pay

## 2020-10-15 ENCOUNTER — Inpatient Hospital Stay
Admission: EM | Admit: 2020-10-15 | Discharge: 2020-11-10 | DRG: 616 | Disposition: A | Discharge status: Home / Self Care | Payer: Medicare HMO | Attending: Internal Medicine | Admitting: Internal Medicine

## 2020-10-15 ENCOUNTER — Encounter: Payer: Self-pay | Admitting: Internal Medicine

## 2020-10-15 DIAGNOSIS — I10 Essential (primary) hypertension: Secondary | ICD-10-CM | POA: Diagnosis not present

## 2020-10-15 DIAGNOSIS — Z89421 Acquired absence of other right toe(s): Secondary | ICD-10-CM | POA: Diagnosis not present

## 2020-10-15 DIAGNOSIS — J690 Pneumonitis due to inhalation of food and vomit: Secondary | ICD-10-CM | POA: Diagnosis not present

## 2020-10-15 DIAGNOSIS — I5032 Chronic diastolic (congestive) heart failure: Secondary | ICD-10-CM | POA: Diagnosis not present

## 2020-10-15 DIAGNOSIS — L02611 Cutaneous abscess of right foot: Secondary | ICD-10-CM | POA: Diagnosis present

## 2020-10-15 DIAGNOSIS — D62 Acute posthemorrhagic anemia: Secondary | ICD-10-CM | POA: Diagnosis not present

## 2020-10-15 DIAGNOSIS — E1169 Type 2 diabetes mellitus with other specified complication: Secondary | ICD-10-CM | POA: Diagnosis present

## 2020-10-15 DIAGNOSIS — Z91048 Other nonmedicinal substance allergy status: Secondary | ICD-10-CM

## 2020-10-15 DIAGNOSIS — R000 Tachycardia, unspecified: Secondary | ICD-10-CM | POA: Diagnosis not present

## 2020-10-15 DIAGNOSIS — R609 Edema, unspecified: Secondary | ICD-10-CM | POA: Diagnosis not present

## 2020-10-15 DIAGNOSIS — R079 Chest pain, unspecified: Secondary | ICD-10-CM | POA: Diagnosis not present

## 2020-10-15 DIAGNOSIS — I739 Peripheral vascular disease, unspecified: Secondary | ICD-10-CM

## 2020-10-15 DIAGNOSIS — J9611 Chronic respiratory failure with hypoxia: Secondary | ICD-10-CM | POA: Diagnosis not present

## 2020-10-15 DIAGNOSIS — I70213 Atherosclerosis of native arteries of extremities with intermittent claudication, bilateral legs: Secondary | ICD-10-CM | POA: Diagnosis not present

## 2020-10-15 DIAGNOSIS — I120 Hypertensive chronic kidney disease with stage 5 chronic kidney disease or end stage renal disease: Secondary | ICD-10-CM | POA: Diagnosis not present

## 2020-10-15 DIAGNOSIS — D638 Anemia in other chronic diseases classified elsewhere: Secondary | ICD-10-CM | POA: Diagnosis not present

## 2020-10-15 DIAGNOSIS — Z7189 Other specified counseling: Secondary | ICD-10-CM

## 2020-10-15 DIAGNOSIS — Z6841 Body Mass Index (BMI) 40.0 and over, adult: Secondary | ICD-10-CM | POA: Diagnosis not present

## 2020-10-15 DIAGNOSIS — L97519 Non-pressure chronic ulcer of other part of right foot with unspecified severity: Secondary | ICD-10-CM

## 2020-10-15 DIAGNOSIS — I96 Gangrene, not elsewhere classified: Secondary | ICD-10-CM

## 2020-10-15 DIAGNOSIS — L97509 Non-pressure chronic ulcer of other part of unspecified foot with unspecified severity: Secondary | ICD-10-CM | POA: Diagnosis present

## 2020-10-15 DIAGNOSIS — B952 Enterococcus as the cause of diseases classified elsewhere: Secondary | ICD-10-CM | POA: Diagnosis present

## 2020-10-15 DIAGNOSIS — R0989 Other specified symptoms and signs involving the circulatory and respiratory systems: Secondary | ICD-10-CM

## 2020-10-15 DIAGNOSIS — N186 End stage renal disease: Secondary | ICD-10-CM | POA: Diagnosis present

## 2020-10-15 DIAGNOSIS — Z8249 Family history of ischemic heart disease and other diseases of the circulatory system: Secondary | ICD-10-CM

## 2020-10-15 DIAGNOSIS — L97518 Non-pressure chronic ulcer of other part of right foot with other specified severity: Secondary | ICD-10-CM | POA: Diagnosis not present

## 2020-10-15 DIAGNOSIS — Z833 Family history of diabetes mellitus: Secondary | ICD-10-CM

## 2020-10-15 DIAGNOSIS — I5030 Unspecified diastolic (congestive) heart failure: Secondary | ICD-10-CM | POA: Diagnosis not present

## 2020-10-15 DIAGNOSIS — E1152 Type 2 diabetes mellitus with diabetic peripheral angiopathy with gangrene: Secondary | ICD-10-CM | POA: Diagnosis present

## 2020-10-15 DIAGNOSIS — Z09 Encounter for follow-up examination after completed treatment for conditions other than malignant neoplasm: Secondary | ICD-10-CM

## 2020-10-15 DIAGNOSIS — I70235 Atherosclerosis of native arteries of right leg with ulceration of other part of foot: Secondary | ICD-10-CM | POA: Diagnosis not present

## 2020-10-15 DIAGNOSIS — J9612 Chronic respiratory failure with hypercapnia: Secondary | ICD-10-CM | POA: Diagnosis present

## 2020-10-15 DIAGNOSIS — J9610 Chronic respiratory failure, unspecified whether with hypoxia or hypercapnia: Secondary | ICD-10-CM | POA: Diagnosis not present

## 2020-10-15 DIAGNOSIS — I132 Hypertensive heart and chronic kidney disease with heart failure and with stage 5 chronic kidney disease, or end stage renal disease: Secondary | ICD-10-CM | POA: Diagnosis present

## 2020-10-15 DIAGNOSIS — Z794 Long term (current) use of insulin: Secondary | ICD-10-CM

## 2020-10-15 DIAGNOSIS — J449 Chronic obstructive pulmonary disease, unspecified: Secondary | ICD-10-CM | POA: Diagnosis present

## 2020-10-15 DIAGNOSIS — R0602 Shortness of breath: Secondary | ICD-10-CM | POA: Diagnosis not present

## 2020-10-15 DIAGNOSIS — E1165 Type 2 diabetes mellitus with hyperglycemia: Secondary | ICD-10-CM | POA: Diagnosis present

## 2020-10-15 DIAGNOSIS — S98131A Complete traumatic amputation of one right lesser toe, initial encounter: Secondary | ICD-10-CM | POA: Diagnosis not present

## 2020-10-15 DIAGNOSIS — D631 Anemia in chronic kidney disease: Secondary | ICD-10-CM | POA: Diagnosis present

## 2020-10-15 DIAGNOSIS — E11628 Type 2 diabetes mellitus with other skin complications: Secondary | ICD-10-CM | POA: Diagnosis present

## 2020-10-15 DIAGNOSIS — L089 Local infection of the skin and subcutaneous tissue, unspecified: Secondary | ICD-10-CM | POA: Diagnosis not present

## 2020-10-15 DIAGNOSIS — H9191 Unspecified hearing loss, right ear: Secondary | ICD-10-CM | POA: Diagnosis present

## 2020-10-15 DIAGNOSIS — Z20822 Contact with and (suspected) exposure to covid-19: Secondary | ICD-10-CM | POA: Diagnosis not present

## 2020-10-15 DIAGNOSIS — Z882 Allergy status to sulfonamides status: Secondary | ICD-10-CM

## 2020-10-15 DIAGNOSIS — R059 Cough, unspecified: Secondary | ICD-10-CM | POA: Diagnosis not present

## 2020-10-15 DIAGNOSIS — Z9981 Dependence on supplemental oxygen: Secondary | ICD-10-CM

## 2020-10-15 DIAGNOSIS — F32A Depression, unspecified: Secondary | ICD-10-CM | POA: Diagnosis present

## 2020-10-15 DIAGNOSIS — I959 Hypotension, unspecified: Secondary | ICD-10-CM | POA: Diagnosis not present

## 2020-10-15 DIAGNOSIS — J45909 Unspecified asthma, uncomplicated: Secondary | ICD-10-CM | POA: Diagnosis not present

## 2020-10-15 DIAGNOSIS — I5040 Unspecified combined systolic (congestive) and diastolic (congestive) heart failure: Secondary | ICD-10-CM | POA: Diagnosis not present

## 2020-10-15 DIAGNOSIS — Z888 Allergy status to other drugs, medicaments and biological substances status: Secondary | ICD-10-CM

## 2020-10-15 DIAGNOSIS — A419 Sepsis, unspecified organism: Secondary | ICD-10-CM

## 2020-10-15 DIAGNOSIS — T17908S Unspecified foreign body in respiratory tract, part unspecified causing other injury, sequela: Secondary | ICD-10-CM | POA: Diagnosis not present

## 2020-10-15 DIAGNOSIS — E871 Hypo-osmolality and hyponatremia: Secondary | ICD-10-CM | POA: Diagnosis present

## 2020-10-15 DIAGNOSIS — T17908A Unspecified foreign body in respiratory tract, part unspecified causing other injury, initial encounter: Secondary | ICD-10-CM

## 2020-10-15 DIAGNOSIS — Z48817 Encounter for surgical aftercare following surgery on the skin and subcutaneous tissue: Secondary | ICD-10-CM | POA: Diagnosis not present

## 2020-10-15 DIAGNOSIS — Z992 Dependence on renal dialysis: Secondary | ICD-10-CM

## 2020-10-15 DIAGNOSIS — L97514 Non-pressure chronic ulcer of other part of right foot with necrosis of bone: Secondary | ICD-10-CM | POA: Diagnosis not present

## 2020-10-15 DIAGNOSIS — N2581 Secondary hyperparathyroidism of renal origin: Secondary | ICD-10-CM | POA: Diagnosis present

## 2020-10-15 DIAGNOSIS — Z7982 Long term (current) use of aspirin: Secondary | ICD-10-CM

## 2020-10-15 DIAGNOSIS — Z66 Do not resuscitate: Secondary | ICD-10-CM | POA: Diagnosis present

## 2020-10-15 DIAGNOSIS — L97513 Non-pressure chronic ulcer of other part of right foot with necrosis of muscle: Secondary | ICD-10-CM | POA: Diagnosis not present

## 2020-10-15 DIAGNOSIS — N16 Renal tubulo-interstitial disorders in diseases classified elsewhere: Secondary | ICD-10-CM | POA: Diagnosis not present

## 2020-10-15 DIAGNOSIS — Z91012 Allergy to eggs: Secondary | ICD-10-CM

## 2020-10-15 DIAGNOSIS — Z79899 Other long term (current) drug therapy: Secondary | ICD-10-CM

## 2020-10-15 DIAGNOSIS — J90 Pleural effusion, not elsewhere classified: Secondary | ICD-10-CM | POA: Diagnosis not present

## 2020-10-15 DIAGNOSIS — Z961 Presence of intraocular lens: Secondary | ICD-10-CM | POA: Diagnosis present

## 2020-10-15 DIAGNOSIS — F419 Anxiety disorder, unspecified: Secondary | ICD-10-CM | POA: Diagnosis not present

## 2020-10-15 DIAGNOSIS — M86171 Other acute osteomyelitis, right ankle and foot: Secondary | ICD-10-CM | POA: Diagnosis present

## 2020-10-15 DIAGNOSIS — E11621 Type 2 diabetes mellitus with foot ulcer: Secondary | ICD-10-CM | POA: Diagnosis not present

## 2020-10-15 DIAGNOSIS — Z7951 Long term (current) use of inhaled steroids: Secondary | ICD-10-CM

## 2020-10-15 DIAGNOSIS — E662 Morbid (severe) obesity with alveolar hypoventilation: Secondary | ICD-10-CM | POA: Diagnosis not present

## 2020-10-15 DIAGNOSIS — R0600 Dyspnea, unspecified: Secondary | ICD-10-CM | POA: Diagnosis not present

## 2020-10-15 DIAGNOSIS — E1122 Type 2 diabetes mellitus with diabetic chronic kidney disease: Secondary | ICD-10-CM | POA: Diagnosis present

## 2020-10-15 DIAGNOSIS — E119 Type 2 diabetes mellitus without complications: Secondary | ICD-10-CM

## 2020-10-15 DIAGNOSIS — Z9842 Cataract extraction status, left eye: Secondary | ICD-10-CM

## 2020-10-15 DIAGNOSIS — T8743 Infection of amputation stump, right lower extremity: Secondary | ICD-10-CM | POA: Diagnosis not present

## 2020-10-15 DIAGNOSIS — I2510 Atherosclerotic heart disease of native coronary artery without angina pectoris: Secondary | ICD-10-CM | POA: Diagnosis present

## 2020-10-15 DIAGNOSIS — J9811 Atelectasis: Secondary | ICD-10-CM | POA: Diagnosis not present

## 2020-10-15 LAB — CBC WITH DIFFERENTIAL/PLATELET
Abs Immature Granulocytes: 0.34 10*3/uL — ABNORMAL HIGH (ref 0.00–0.07)
Basophils Absolute: 0.1 10*3/uL (ref 0.0–0.1)
Basophils Relative: 0 %
Eosinophils Absolute: 0.1 10*3/uL (ref 0.0–0.5)
Eosinophils Relative: 0 %
HCT: 30.6 % — ABNORMAL LOW (ref 36.0–46.0)
Hemoglobin: 9.8 g/dL — ABNORMAL LOW (ref 12.0–15.0)
Immature Granulocytes: 2 %
Lymphocytes Relative: 5 %
Lymphs Abs: 1.1 10*3/uL (ref 0.7–4.0)
MCH: 30.9 pg (ref 26.0–34.0)
MCHC: 32 g/dL (ref 30.0–36.0)
MCV: 96.5 fL (ref 80.0–100.0)
Monocytes Absolute: 1.5 10*3/uL — ABNORMAL HIGH (ref 0.1–1.0)
Monocytes Relative: 7 %
Neutro Abs: 19 10*3/uL — ABNORMAL HIGH (ref 1.7–7.7)
Neutrophils Relative %: 86 %
Platelets: 358 10*3/uL (ref 150–400)
RBC: 3.17 MIL/uL — ABNORMAL LOW (ref 3.87–5.11)
RDW: 13.6 % (ref 11.5–15.5)
WBC: 22.1 10*3/uL — ABNORMAL HIGH (ref 4.0–10.5)
nRBC: 0 % (ref 0.0–0.2)

## 2020-10-15 LAB — COMPREHENSIVE METABOLIC PANEL
ALT: 59 U/L — ABNORMAL HIGH (ref 0–44)
AST: 64 U/L — ABNORMAL HIGH (ref 15–41)
Albumin: 3.3 g/dL — ABNORMAL LOW (ref 3.5–5.0)
Alkaline Phosphatase: 559 U/L — ABNORMAL HIGH (ref 38–126)
Anion gap: 15 (ref 5–15)
BUN: 52 mg/dL — ABNORMAL HIGH (ref 6–20)
CO2: 23 mmol/L (ref 22–32)
Calcium: 9.3 mg/dL (ref 8.9–10.3)
Chloride: 92 mmol/L — ABNORMAL LOW (ref 98–111)
Creatinine, Ser: 8.88 mg/dL — ABNORMAL HIGH (ref 0.44–1.00)
GFR, Estimated: 5 mL/min — ABNORMAL LOW (ref >60)
Glucose, Bld: 241 mg/dL — ABNORMAL HIGH (ref 70–99)
Potassium: 4.7 mmol/L (ref 3.5–5.1)
Sodium: 130 mmol/L — ABNORMAL LOW (ref 135–145)
Total Bilirubin: 0.7 mg/dL (ref 0.3–1.2)
Total Protein: 8.3 g/dL — ABNORMAL HIGH (ref 6.5–8.1)

## 2020-10-15 LAB — TROPONIN I (HIGH SENSITIVITY): Troponin I (High Sensitivity): 57 ng/L — ABNORMAL HIGH (ref <18)

## 2020-10-15 LAB — RESP PANEL BY RT-PCR (FLU A&B, COVID) ARPGX2
Influenza A by PCR: NEGATIVE
Influenza B by PCR: NEGATIVE
SARS Coronavirus 2 by RT PCR: NEGATIVE

## 2020-10-15 LAB — LACTIC ACID, PLASMA: Lactic Acid, Venous: 1.8 mmol/L (ref 0.5–1.9)

## 2020-10-15 LAB — PROTIME-INR
INR: 1.2 (ref 0.8–1.2)
Prothrombin Time: 14.9 seconds (ref 11.4–15.2)

## 2020-10-15 LAB — APTT: aPTT: 35 seconds (ref 24–36)

## 2020-10-15 IMAGING — DX DG FOOT 2V*R*
2 series · 2 of 2 positions shown · non-contrast
Comparison: None.

CLINICAL DATA: Fifth toe infection.  Evaluate for osteomyelitis.

EXAM:
RIGHT FOOT - 2 VIEW

[foot ap]
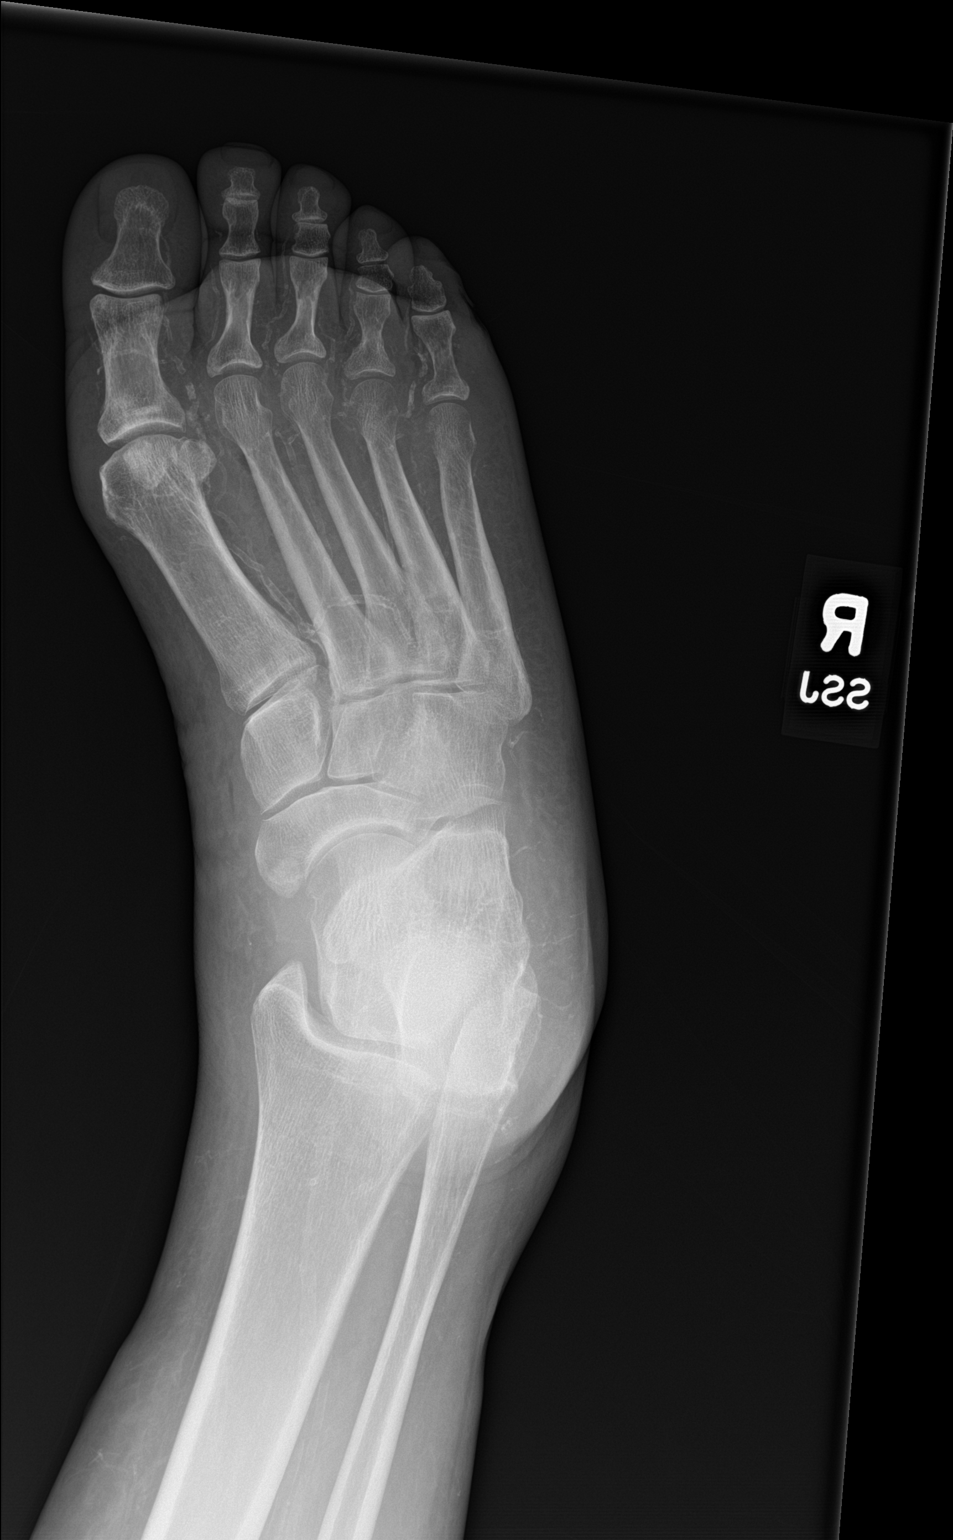

[foot lat]
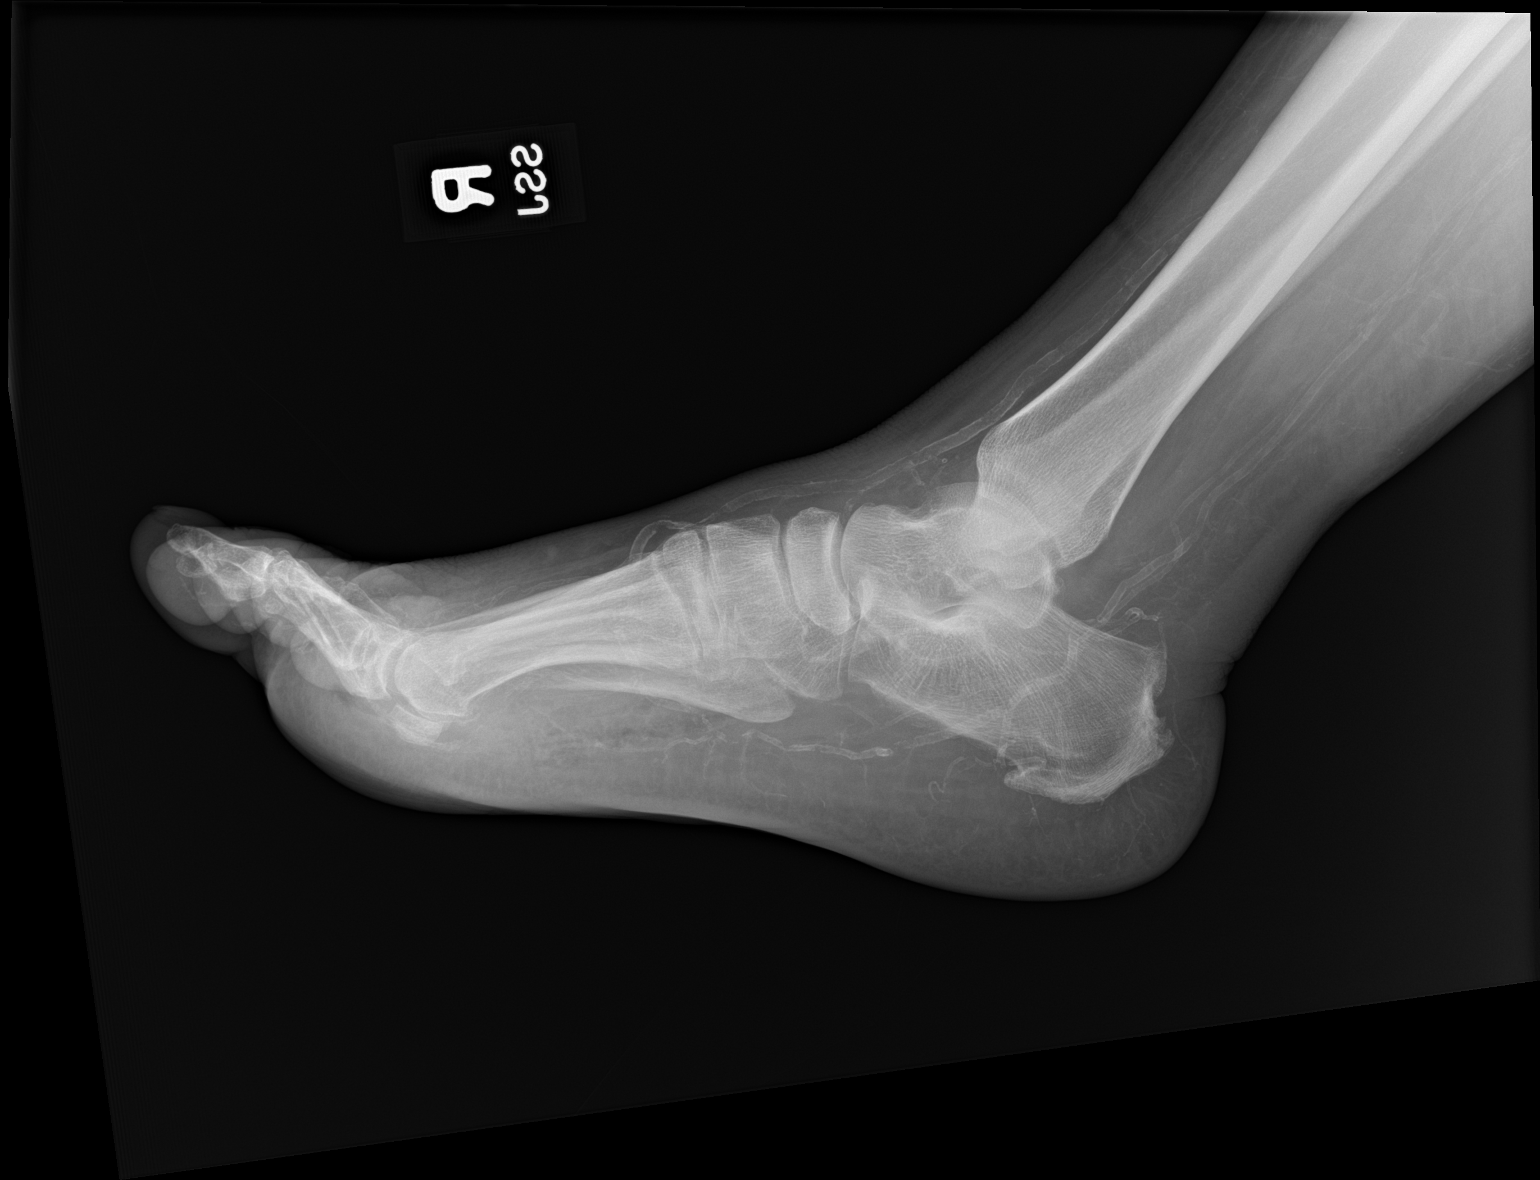

[2 of 2 positions shown; findings below may reference images not displayed]

FINDINGS: There is no acute fracture or dislocation. No bone erosion or
periosteal elevation to suggest acute osteomyelitis. The bones are
osteopenic. Mild diffuse subcutaneous edema. Extensive vascular
calcification of the foot. No radiopaque foreign object or soft
tissue gas.
IMPRESSION: 1. No acute fracture or dislocation. No radiographic evidence of
acute osteomyelitis.
2. Osteopenia.

## 2020-10-15 IMAGING — CR DG CHEST 2V
2 series · 2 of 2 positions shown · non-contrast
Comparison: Chest radiograph dated [DATE].

CLINICAL DATA: Chest pain.

EXAM:
CHEST - 2 VIEW

[chest lat]
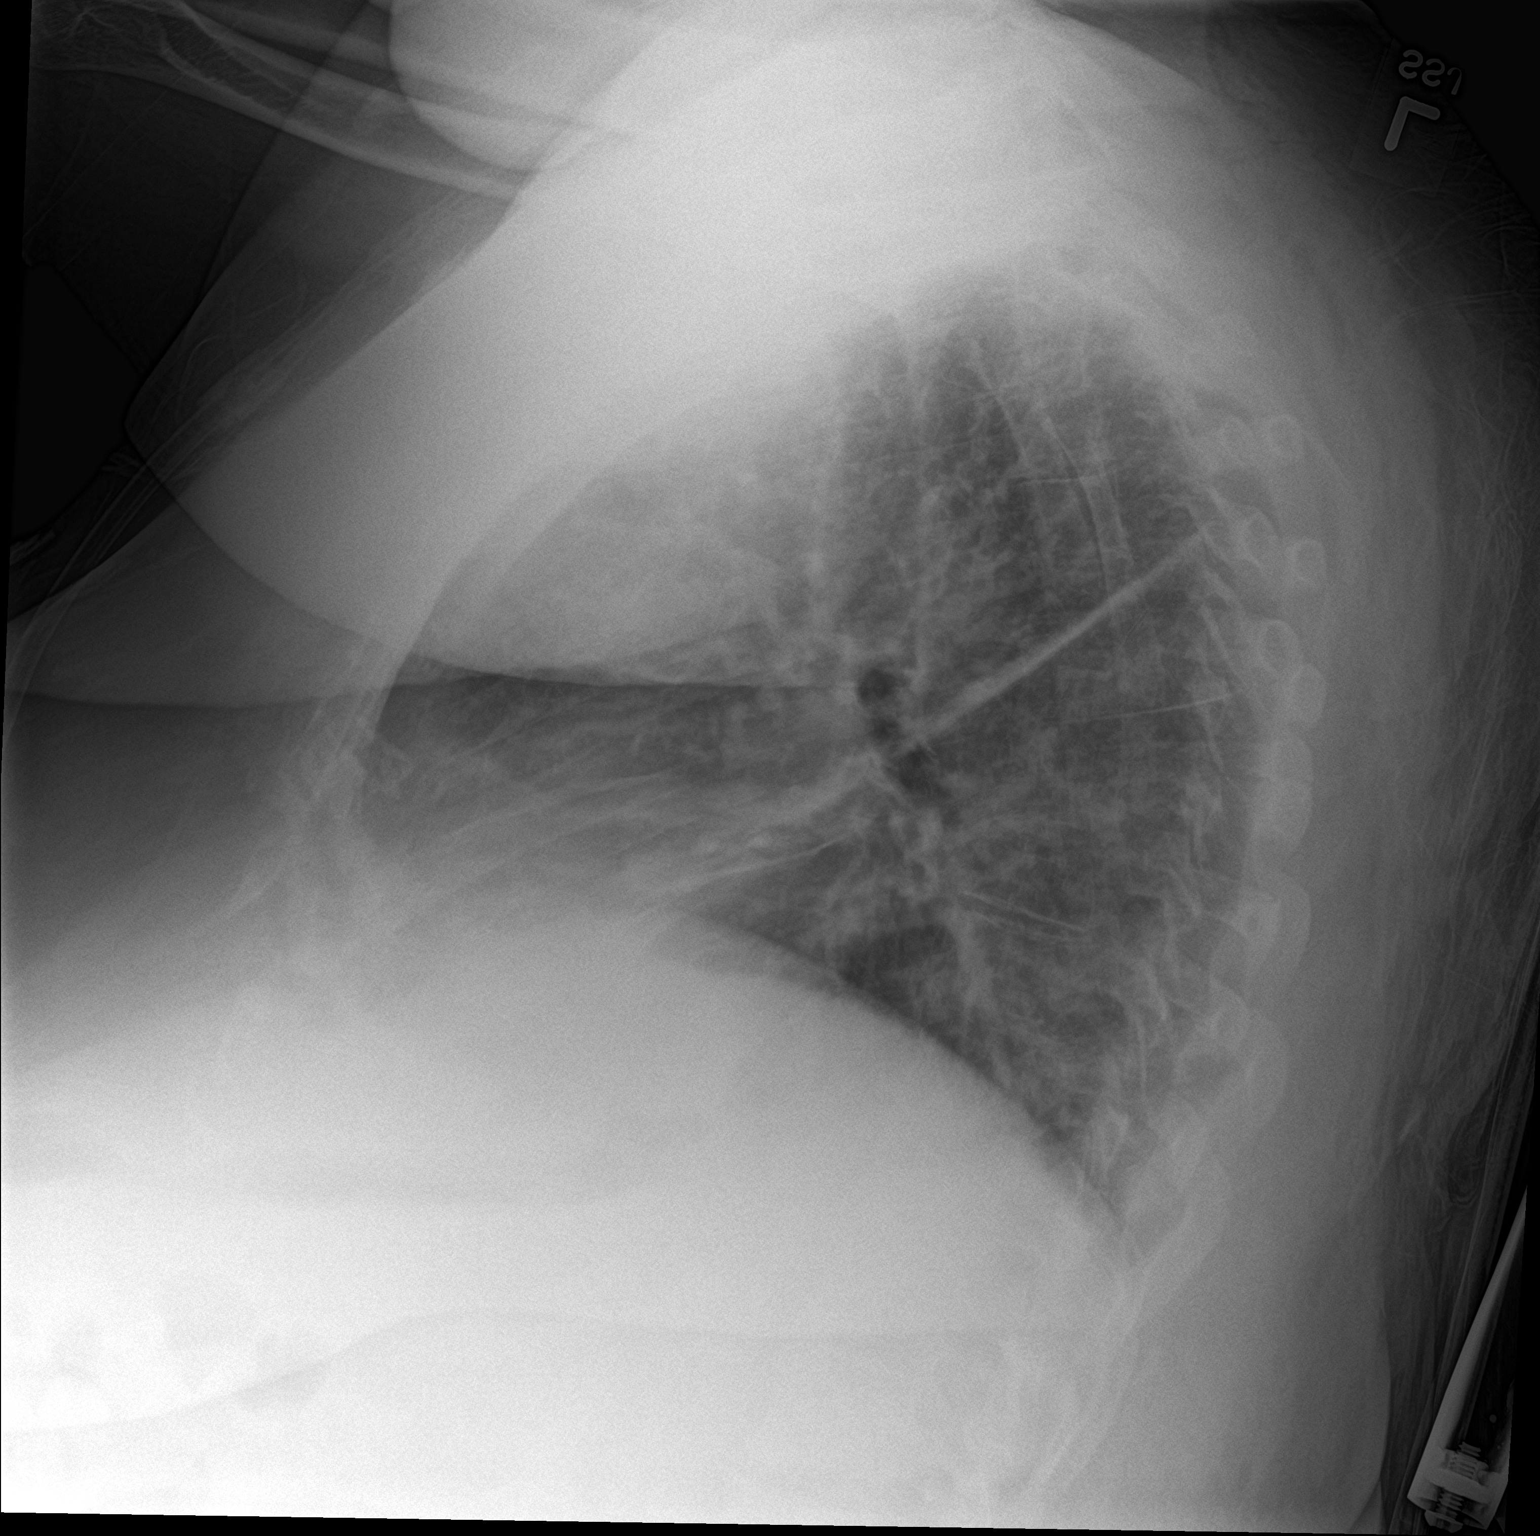

[chest ap]
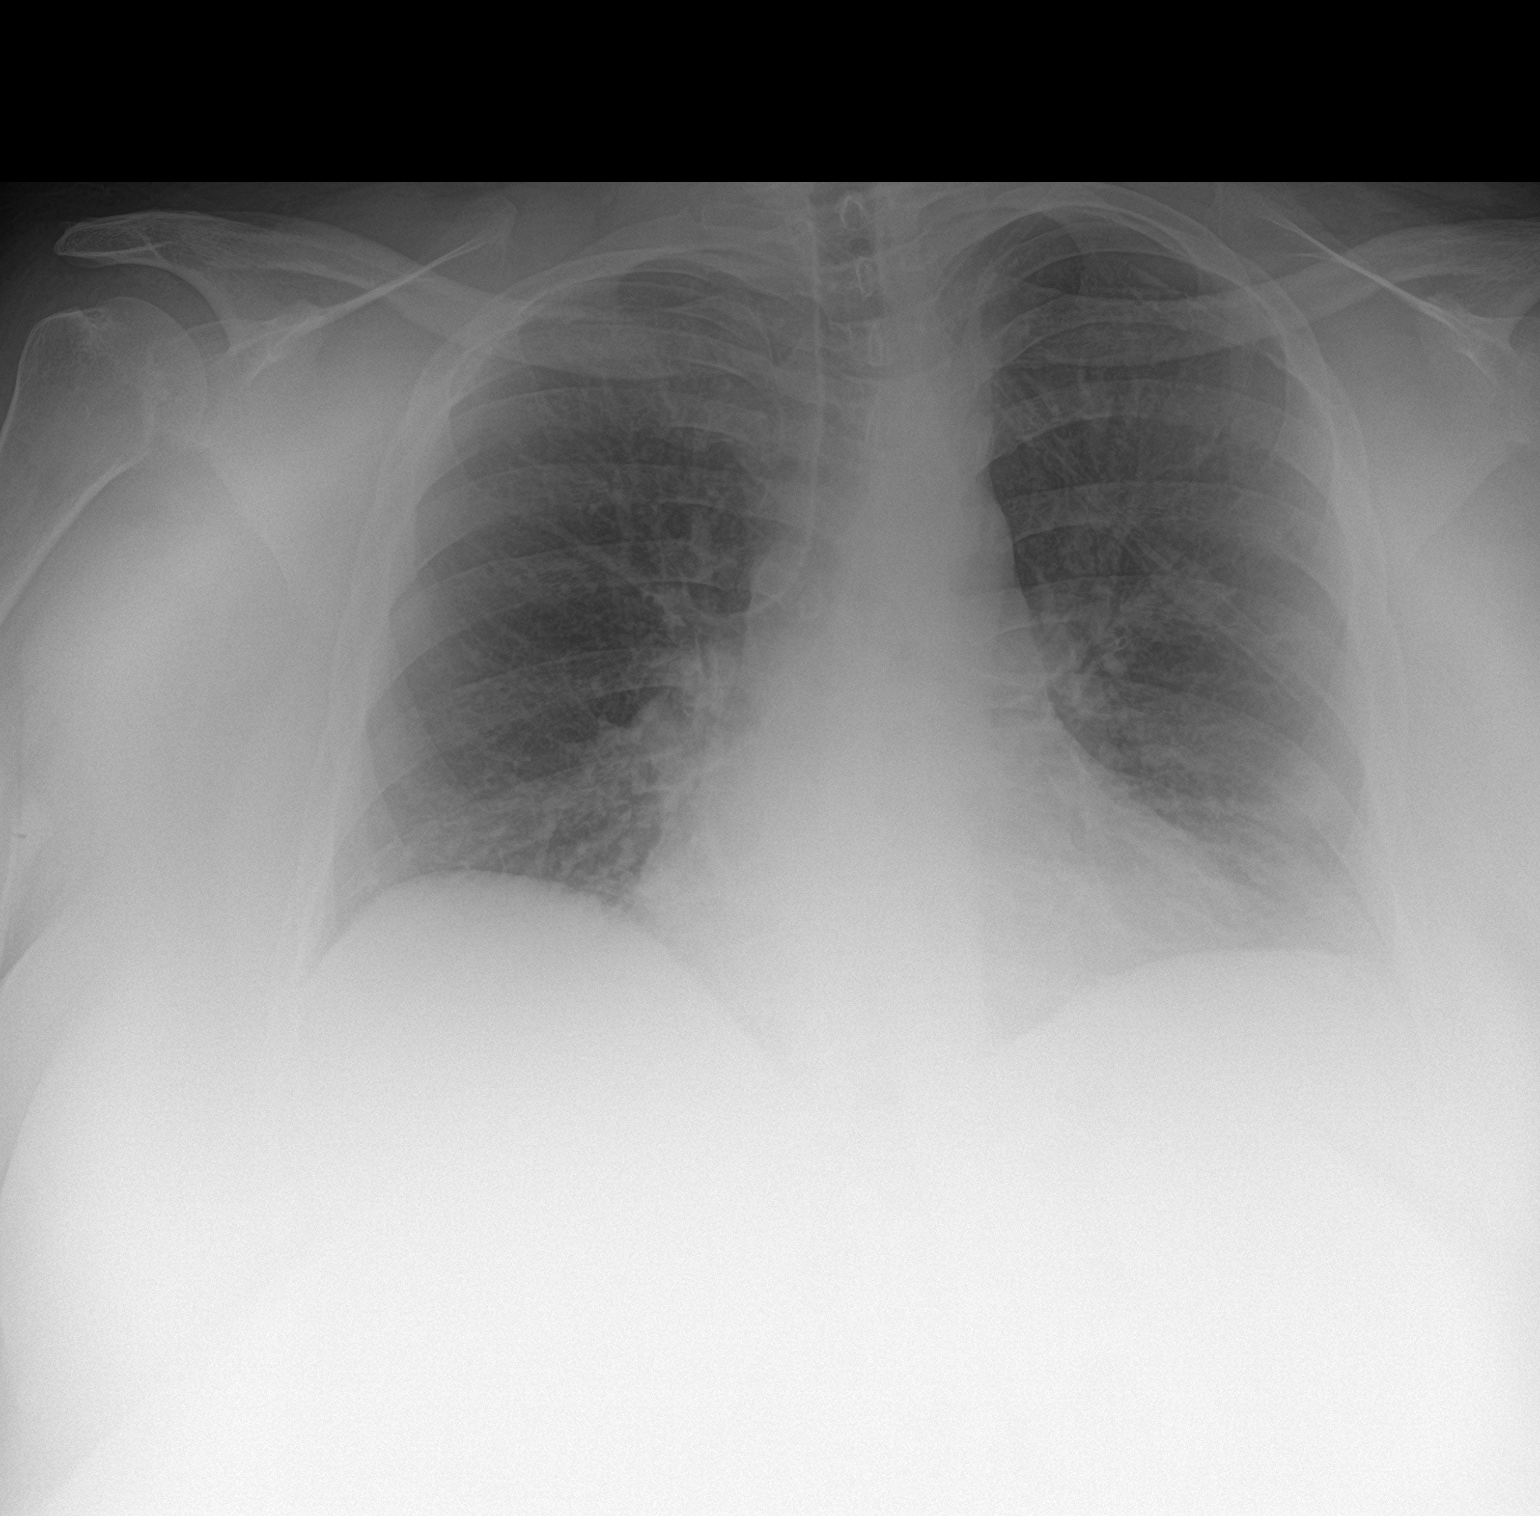

[2 of 2 positions shown; findings below may reference images not displayed]

FINDINGS: No focal consolidation, pleural effusion or pneumothorax. The
cardiac silhouette is within normal limits. No acute osseous
pathology.
IMPRESSION: No active cardiopulmonary disease.

## 2020-10-15 MED ORDER — SODIUM CHLORIDE 0.9% FLUSH
3.0000 mL | Freq: Two times a day (BID) | INTRAVENOUS | Status: DC
  Administered 2020-10-16 – 2020-11-10 (×45): 3 mL via INTRAVENOUS

## 2020-10-15 MED ORDER — HEPARIN SODIUM (PORCINE) 5000 UNIT/ML IJ SOLN
5000.0000 [IU] | Freq: Three times a day (TID) | INTRAMUSCULAR | Status: DC
  Administered 2020-10-16 – 2020-11-10 (×73): 5000 [IU] via SUBCUTANEOUS
  Filled 2020-10-15 (×74): qty 1

## 2020-10-15 MED ORDER — OXYCODONE-ACETAMINOPHEN 5-325 MG PO TABS
1.0000 | ORAL_TABLET | ORAL | Status: DC | PRN
  Filled 2020-10-15: qty 1

## 2020-10-15 MED ORDER — VANCOMYCIN HCL IN DEXTROSE 1-5 GM/200ML-% IV SOLN
1000.0000 mg | Freq: Once | INTRAVENOUS | Status: AC
  Administered 2020-10-15: 1000 mg via INTRAVENOUS
  Filled 2020-10-15: qty 200

## 2020-10-15 MED ORDER — SODIUM CHLORIDE 0.9 % IV SOLN
2.0000 g | Freq: Once | INTRAVENOUS | Status: AC
  Administered 2020-10-15: 2 g via INTRAVENOUS
  Filled 2020-10-15: qty 20

## 2020-10-15 MED ORDER — LACTATED RINGERS IV SOLN: INTRAVENOUS | Status: AC

## 2020-10-15 MED ORDER — INSULIN ASPART 100 UNIT/ML ~~LOC~~ SOLN
0.0000 [IU] | Freq: Every day | SUBCUTANEOUS | Status: DC
Start: 2020-10-15 — End: 2020-10-17
  Administered 2020-10-16: 3 [IU] via SUBCUTANEOUS
  Filled 2020-10-15: qty 0.05

## 2020-10-15 MED ORDER — ASPIRIN EC 81 MG PO TBEC
81.0000 mg | DELAYED_RELEASE_TABLET | Freq: Every day | ORAL | Status: DC
  Administered 2020-10-16 – 2020-11-10 (×24): 81 mg via ORAL
  Filled 2020-10-15 (×24): qty 1

## 2020-10-15 MED ORDER — FERRIC CITRATE 1 GM 210 MG(FE) PO TABS
210.0000 mg | ORAL_TABLET | Freq: Three times a day (TID) | ORAL | Status: DC
  Administered 2020-10-16 – 2020-11-10 (×64): 210 mg via ORAL
  Filled 2020-10-15 (×79): qty 1

## 2020-10-15 MED ORDER — LACTULOSE 10 GM/15ML PO SOLN
20.0000 g | Freq: Every day | ORAL | Status: DC | PRN
  Administered 2020-10-16 – 2020-10-18 (×2): 20 g via ORAL
  Filled 2020-10-15 (×3): qty 30

## 2020-10-15 MED ORDER — ATORVASTATIN CALCIUM 20 MG PO TABS
80.0000 mg | ORAL_TABLET | Freq: Every day | ORAL | Status: DC
  Administered 2020-10-16 – 2020-10-24 (×8): 80 mg via ORAL
  Filled 2020-10-15 (×9): qty 4

## 2020-10-15 MED ORDER — ALBUTEROL SULFATE HFA 108 (90 BASE) MCG/ACT IN AERS
2.0000 | INHALATION_SPRAY | RESPIRATORY_TRACT | Status: DC | PRN
  Administered 2020-10-18 – 2020-11-03 (×9): 2 via RESPIRATORY_TRACT
  Filled 2020-10-15 (×3): qty 6.7

## 2020-10-15 MED ORDER — INSULIN GLARGINE-YFGN 100 UNIT/ML ~~LOC~~ SOLN
36.0000 [IU] | Freq: Every day | SUBCUTANEOUS | Status: DC
  Administered 2020-10-16 – 2020-10-29 (×14): 36 [IU] via SUBCUTANEOUS
  Filled 2020-10-15 (×16): qty 0.36

## 2020-10-15 MED ORDER — HYDRALAZINE HCL 20 MG/ML IJ SOLN: 10.0000 mg | Freq: Four times a day (QID) | INTRAMUSCULAR | Status: DC | PRN

## 2020-10-15 MED ORDER — MOMETASONE FURO-FORMOTEROL FUM 200-5 MCG/ACT IN AERO
2.0000 | INHALATION_SPRAY | Freq: Two times a day (BID) | RESPIRATORY_TRACT | Status: DC
  Administered 2020-10-16 – 2020-11-10 (×51): 2 via RESPIRATORY_TRACT
  Filled 2020-10-15 (×3): qty 8.8

## 2020-10-15 MED ORDER — ALBUTEROL SULFATE (2.5 MG/3ML) 0.083% IN NEBU
2.5000 mg | INHALATION_SOLUTION | Freq: Four times a day (QID) | RESPIRATORY_TRACT | Status: DC | PRN
  Administered 2020-10-16 – 2020-11-10 (×23): 2.5 mg via RESPIRATORY_TRACT
  Filled 2020-10-15 (×26): qty 3

## 2020-10-15 MED ORDER — IPRATROPIUM-ALBUTEROL 0.5-2.5 (3) MG/3ML IN SOLN
3.0000 mL | Freq: Four times a day (QID) | RESPIRATORY_TRACT | Status: DC | PRN
  Administered 2020-10-16 – 2020-10-23 (×17): 3 mL via RESPIRATORY_TRACT
  Filled 2020-10-15 (×18): qty 3

## 2020-10-15 MED ORDER — ONDANSETRON 4 MG PO TBDP
4.0000 mg | ORAL_TABLET | Freq: Three times a day (TID) | ORAL | Status: DC | PRN
  Filled 2020-10-15: qty 1

## 2020-10-15 MED ORDER — SODIUM CHLORIDE 0.9 % IV SOLN
1.0000 g | INTRAVENOUS | Status: DC
  Filled 2020-10-15: qty 10

## 2020-10-15 MED ORDER — DOCUSATE SODIUM 100 MG PO CAPS
100.0000 mg | ORAL_CAPSULE | Freq: Every day | ORAL | Status: DC
  Administered 2020-10-16 – 2020-11-10 (×24): 100 mg via ORAL
  Filled 2020-10-15 (×24): qty 1

## 2020-10-15 MED ORDER — SODIUM CHLORIDE 0.9 % IV SOLN
250.0000 mL | INTRAVENOUS | Status: DC | PRN
  Administered 2020-10-16 – 2020-10-21 (×2): 250 mL via INTRAVENOUS

## 2020-10-15 MED ORDER — POLYETHYLENE GLYCOL 3350 17 G PO PACK
17.0000 g | PACK | Freq: Every day | ORAL | Status: DC | PRN
  Filled 2020-10-15: qty 1

## 2020-10-15 MED ORDER — INSULIN DEGLUDEC 100 UNIT/ML ~~LOC~~ SOPN
36.0000 [IU] | PEN_INJECTOR | Freq: Every day | SUBCUTANEOUS | Status: DC
  Filled 2020-10-15: qty 3

## 2020-10-15 MED ORDER — VANCOMYCIN HCL 1500 MG/300ML IV SOLN
1500.0000 mg | Freq: Once | INTRAVENOUS | Status: AC
  Administered 2020-10-15: 1500 mg via INTRAVENOUS
  Filled 2020-10-15: qty 300

## 2020-10-15 MED ORDER — SODIUM CHLORIDE 0.9% FLUSH: 3.0000 mL | INTRAVENOUS | Status: DC | PRN

## 2020-10-15 MED ORDER — VITAMIN D3 25 MCG (1000 UNIT) PO TABS
4000.0000 [IU] | ORAL_TABLET | ORAL | Status: DC
  Administered 2020-10-17 – 2020-11-05 (×3): 4000 [IU] via ORAL
  Filled 2020-10-15 (×9): qty 4

## 2020-10-15 MED ORDER — ACETAMINOPHEN 500 MG PO TABS
1000.0000 mg | ORAL_TABLET | Freq: Four times a day (QID) | ORAL | Status: DC | PRN
  Administered 2020-10-16 – 2020-11-08 (×17): 1000 mg via ORAL
  Filled 2020-10-15 (×18): qty 2

## 2020-10-15 NOTE — Consult Note (Signed)
CODE SEPSIS - PHARMACY COMMUNICATION  **Broad Spectrum Antibiotics should be administered within 1 hour of Sepsis diagnosis**  Time Code Sepsis Called/Page Received: 1958  Antibiotics Ordered: 1958  Time of 1st antibiotic administration: 2045  Additional action taken by pharmacy: N/A  If necessary, Name of Provider/Nurse Contacted: N/A    Darnelle Bos ,PharmD Clinical Pharmacist  10/15/2020  8:28 PM

## 2020-10-15 NOTE — ED Provider Notes (Signed)
[facility] Emergency Department Provider Note   ____________________________________________    I have reviewed the triage vital signs and the nursing notes.   HISTORY  Chief Complaint Chest Pain     HPI [name] is a 50 y.o. female with extensive past medical history including diabetes, end-stage renal disease who presents with complaints of fever, fatigue, generalized malaise.  She denies nasal congestion or body aches.  She reports foul smell from her right great toe.  She reports chronic ulcer and notes frequent dressing changes but recently started to smell strongly.  Past Medical History:  Diagnosis Date   (HFpEF) heart failure with preserved ejection fraction ([place])    a. 11/2014 Echo: EF 50-55%, Gr1 DD; b. 06/2016 Echo: EF 60-65%, no rwma, mod dil LA, nl RV, triv eff.   Anxiety    Asthma    Bronchitis    CHF (congestive heart failure) (HCC)    Chronic cough    Chronic respiratory failure with hypoxia and hypercapnia ([place])    a. Home O2.  (2-4L)   CKD (chronic kidney disease), stage III (HCC)    Depression    Diabetes mellitus without complication (HCC)    Diverticulosis    Dyspnea    Dysrhythmia    per pt, no arrythmias   Environmental and seasonal allergies    Extreme obesity with alveolar hypoventilation (HCC)    Hypertension    Iron deficiency anemia    a. chronic blood loss->heavy menses.   Kidney failure    dialysis tues/thur/sat   ... left arm shunt   Orthopnea    Oxygen dependent    3l/min   Pericardial effusion    a. 11/2014 CT Chest: Mod effusion; b. 11/2014 Echo: small to mod circumferential effusion. No hemodynamic compromise.   Pneumonia    history   Poorly controlled diabetes mellitus ([place])    a. 11/2014 A1c 13.2.   Swelling of both lower extremities     Patient Active Problem List   Diagnosis Date Noted   Chronic respiratory failure with hypoxia and hypercapnia ([place])    Extreme obesity with alveolar  hypoventilation ([place])    Palliative care by specialist    Goals of care, counseling/discussion    ESRD (end stage renal disease) ([place])    DNR (do not resuscitate) discussion    Stage 4 chronic kidney disease ([place])    Adjustment disorder with mixed anxiety and depressed mood 03/31/2017   Dysthymia 03/31/2017   Chronic pain syndrome 03/31/2017   Palliative care encounter    Obesity hypoventilation syndrome ([place]) 03/09/2017   Chronic diastolic heart failure ([place]) 01/07/2017   Depression 09/24/2016   Acute on chronic diastolic heart failure ([place]) 07/04/2016   COPD with chronic bronchitis ([place]) 07/04/2016   CKD (chronic kidney disease), stage III ([place]) 06/26/2016   COPD exacerbation ([place]) 03/25/2016   Anxiety 10/02/2015   Asthma 08/30/2015   Poorly controlled type 2 diabetes mellitus ([place]) 03/13/2015   Iron deficiency anemia 02/17/2015   Obesity, morbid (HCC)    Shortness of breath    Essential hypertension    Hypertensive heart disease with congestive heart failure ([place]) 12/18/2014   Diabetes ([place]) 12/15/2014    Past Surgical History:  Procedure Laterality Date   A/V FISTULAGRAM Left 06/09/2017   Procedure: A/V FISTULAGRAM;  Surgeon: Algernon Huxley, MD;  Location: [place] [facility];  Service: Cardiovascular;  Laterality: Left;   A/V FISTULAGRAM Left 06/16/2017   Procedure: A/V FISTULAGRAM;  Surgeon:  Algernon Huxley, MD;  Location: [place] [facility];  Service: Cardiovascular;  Laterality: Left;   A/V FISTULAGRAM Left 01/28/2018   Procedure: A/V FISTULAGRAM;  Surgeon: Algernon Huxley, MD;  Location: [place] [facility];  Service: Cardiovascular;  Laterality: Left;   AV FISTULA PLACEMENT Left 04/25/2017   Procedure: ARTERIOVENOUS (AV) FISTULA CREATION;  Surgeon: Algernon Huxley, MD;  Location: [facility];  Service: Vascular;  Laterality: Left;   CATARACT EXTRACTION W/PHACO Left 01/21/2018   Procedure: CATARACT EXTRACTION PHACO AND INTRAOCULAR LENS PLACEMENT (IOC);  Surgeon: Leandrew Koyanagi,  MD;  Location: [facility];  Service: Ophthalmology;  Laterality: Left;  [place] 01:55.5 CDE 9.05 EAUP 13.9 Fluid Pack Lot # [id number]   CESAREAN SECTION     times 3   DIALYSIS/PERMA CATHETER INSERTION Right 04/04/2017   Procedure: DIALYSIS/PERMA CATHETER INSERTION;  Surgeon: Katha Cabal, MD;  Location: [place] [facility];  Service: Cardiovascular;  Laterality: Right;   DIALYSIS/PERMA CATHETER REMOVAL N/A 09/17/2017   Procedure: DIALYSIS/PERMA CATHETER REMOVAL;  Surgeon: Algernon Huxley, MD;  Location: [place] [facility];  Service: Cardiovascular;  Laterality: N/A;   INSERTION OF DIALYSIS CATHETER N/A 04/16/2017   Procedure: INSERTION OF DIALYSIS PERM CATHETER;  Surgeon: Algernon Huxley, MD;  Location: [facility];  Service: Vascular;  Laterality: N/A;    Prior to Admission medications   Medication Sig Start Date End Date Taking? Authorizing Provider  acetaminophen (TYLENOL) 500 MG tablet Take 1,000 mg by mouth every 6 (six) hours as needed for mild pain, fever or headache.    [provider]  albuterol (PROVENTIL) (2.5 MG/3ML) 0.083% nebulizer solution Take 2.5 mg by nebulization every 6 (six) hours as needed for wheezing or shortness of breath.    [provider]  albuterol (VENTOLIN HFA) 108 (90 Base) MCG/ACT inhaler Inhale 2 puffs into the lungs every 4 (four) hours as needed for wheezing or shortness of breath. 12/30/19   Tyler Pita, MD  aspirin EC 81 MG tablet Take 81 mg by mouth daily.     [provider]  atorvastatin (LIPITOR) 80 MG tablet Take 80 mg by mouth daily at 6 PM.    [provider]  b complex-vitamin c-folic acid (NEPHRO-VITE) 0.8 MG TABS tablet Take 1 tablet by mouth at bedtime.    [provider]  Cholecalciferol (HM VITAMIN D3) 4000 units CAPS Take 1 capsule by mouth once a week.    [provider]  cloNIDine (CATAPRES) 0.2 MG tablet Take 1 tablet (0.2 mg total) by mouth 2 (two) times daily. Patient taking differently:  Take 0.2 mg by mouth 2 (two) times daily.  01/23/17   Alisa Graff, FNP  docusate sodium (COLACE) 100 MG capsule Take 100 mg by mouth daily.    [provider]  ELDERBERRY PO Take by mouth daily.    [provider]  ferric citrate (AURYXIA) 1 GM 210 MG(Fe) tablet Take 210 mg by mouth 3 (three) times daily with meals.    [provider]  insulin aspart (NOVOLOG) 100 UNIT/ML injection Inject 0-5 Units into the skin at bedtime. 04/26/17   Epifanio Lesches, MD  insulin aspart (NOVOLOG) 100 UNIT/ML injection Inject 0-15 Units into the skin 3 (three) times daily with meals. 04/26/17   Epifanio Lesches, MD  insulin degludec (TRESIBA) 100 UNIT/ML SOPN FlexTouch Pen Inject 36 Units into the skin daily at 10 pm.     [provider]  ipratropium-albuterol (DUONEB) 0.5-2.5 (3) MG/3ML SOLN Take 3  mLs by nebulization every 6 (six) hours as needed. 03/10/17   Salary, Avel Peace, MD  lactulose (CHRONULAC) 10 GM/15ML solution Take 30 mLs (20 g total) by mouth daily as needed for severe constipation. 04/26/17   Epifanio Lesches, MD  lidocaine-prilocaine (EMLA) cream Apply topically as needed (For access for dialysis).    [provider]  losartan (COZAAR) 50 MG tablet Take 50 mg by mouth 2 (two) times daily. On dialysis days, patient is taking in the evening    [provider]  metoprolol tartrate (LOPRESSOR) 100 MG tablet Take 1 tablet (100 mg total) by mouth 2 (two) times daily. 11/16/16   Bettey Costa, MD  montelukast (SINGULAIR) 10 MG tablet Take 1 tablet (10 mg total) by mouth daily. 10/13/19 01/11/20  Tyler Pita, MD  ondansetron (ZOFRAN ODT) 4 MG disintegrating tablet Take 1 tablet (4 mg total) by mouth every 8 (eight) hours as needed for nausea or vomiting. 05/27/20   Harvest Dark, MD  oxyCODONE-acetaminophen (PERCOCET) 5-325 MG tablet Take 1 tablet by mouth every 4 (four) hours as needed for severe pain. 05/27/20   Harvest Dark, MD   OXYGEN Inhale 2-4 L into the lungs.    [provider]  OZEMPIC, 0.25 OR 0.5 MG/DOSE, 2 MG/1.5ML SOPN  04/03/19   [provider]  polyethylene glycol (MIRALAX / GLYCOLAX) packet Take 17 g by mouth daily as needed for mild constipation. 06/29/16   Vaughan Basta, MD  torsemide (DEMADEX) 100 MG tablet Take 1 tablet (100 mg total) by mouth 2 (two) times daily. 03/10/17   Salary, Holly Bodily D, MD  TRELEGY ELLIPTA 200-62.5-25 MCG/INH AEPB INHALE 1 PUFF BY MOUTH ONCE DAILY. APPOINTMENT NEEDED FOR FURTHER REFILLS. 10/07/20   Tyler Pita, MD     Allergies Dust mite extract, Eggs or egg-derived products, Mold extract [trichophyton], Pollen extract, Sulfa antibiotics, and Feraheme [ferumoxytol]  Family History  Problem Relation Age of Onset   Diabetes Mother    Hypertension Mother    Hypertension Father    Diabetes Father    Hypertension Other    Diabetes Other    Breast cancer Maternal Aunt 66   Breast cancer Maternal Aunt 50    Social History Social History   Tobacco Use   Smoking status: Never   Smokeless tobacco: Never  Vaping Use   Vaping Use: Never used  Substance Use Topics   Alcohol use: Yes    Alcohol/week: 0.0 standard drinks    Comment: occasional   Drug use: Yes    Types: Marijuana    Review of Systems  Constitutional: Positive fever, fatigue Eyes: No visual changes.  ENT: No sore throat. Cardiovascular: Denies chest pain. Respiratory: Denies shortness of breath. Gastrointestinal: No abdominal pain.  No nausea, no vomiting.   Genitourinary: Negative for dysuria. Musculoskeletal: Toe ulcer as above Skin: As above Neurological: Negative for headaches or weakness   ____________________________________________   PHYSICAL EXAM:  VITAL SIGNS: ED Triage Vitals  Enc Vitals Group     BP 10/15/20 1911 (!) 118/91     Pulse Rate 10/15/20 1911 (!) 139     Resp 10/15/20 1911 (!) 22     Temp 10/15/20 1911 100.3 F (37.9 C)     Temp Source  10/15/20 1911 Oral     SpO2 10/15/20 1911 93 %     Weight 10/15/20 1905 126.1 kg (278 lb)     Height 10/15/20 1905 1.638 m (5' 4.5")     Head Circumference --  Peak Flow --      Pain Score 10/15/20 1905 8     Pain Loc --      Pain Edu? --      Excl. in [place]? --     Constitutional: Alert and oriented.  Eyes: Conjunctivae are normal.   Nose: No congestion/rhinnorhea. Mouth/Throat: Mucous membranes are moist.    Cardiovascular: Normal rate, regular rhythm. Grossly normal heart sounds.  Good peripheral circulation. Respiratory: Normal respiratory effort.  No retractions. Lungs CTAB. Gastrointestinal: Soft and nontender. No distention.  No CVA tenderness. Genitourinary: deferred Musculoskeletal: Left upper extremity, AV fistula, normal exam.  Right fifth toe appears to be gangrenous, foul-smelling  Neurologic:  Normal speech and language. No gross focal neurologic deficits are appreciated.  Skin:  Skin is warm, dry.  See above Psychiatric: Mood and affect are normal. Speech and behavior are normal.  ____________________________________________   LABS (all labs ordered are listed, but only abnormal results are displayed)  Labs Reviewed  RESP PANEL BY RT-PCR (FLU A&B, COVID) [id number]  CULTURE, BLOOD (ROUTINE X 2)  CULTURE, BLOOD (ROUTINE X 2)  URINE CULTURE  LACTIC ACID, PLASMA  LACTIC ACID, PLASMA  COMPREHENSIVE METABOLIC PANEL  CBC WITH DIFFERENTIAL/PLATELET  PROTIME-INR  APTT  URINALYSIS, COMPLETE (UACMP) WITH MICROSCOPIC  POC URINE PREG, ED   ____________________________________________  EKG  ED ECG REPORT I, Lavonia Drafts, the attending physician, personally viewed and interpreted this ECG.  Date: 10/15/2020  Rhythm: Sinus tachycardia QRS Axis: normal Intervals: normal ST/T Wave abnormalities: normal Narrative Interpretation: no evidence of acute ischemia  ____________________________________________  RADIOLOGY  Chest x-ray reviewed by me, no acute  abnormality ____________________________________________   PROCEDURES  Procedure(s) performed: No  Procedures   Critical Care performed: yes  CRITICAL CARE Performed by: Lavonia Drafts   Total critical care time: 30 minutes  Critical care time was exclusive of separately billable procedures and treating other patients.  Critical care was necessary to treat or prevent imminent or life-threatening deterioration.  Critical care was time spent personally by me on the following activities: development of treatment plan with patient and/or surrogate as well as nursing, discussions with consultants, evaluation of patient's response to treatment, examination of patient, obtaining history from patient or surrogate, ordering and performing treatments and interventions, ordering and review of laboratory studies, ordering and review of radiographic studies, pulse oximetry and re-evaluation of patient's condition.  ____________________________________________   INITIAL IMPRESSION / ASSESSMENT AND PLAN / ED COURSE  Pertinent labs & imaging results that were available during my care of the patient were reviewed by me and considered in my medical decision making (see chart for details).   Patient presents with symptoms as above, found to be febrile here in the emergency department with tachycardia.  Presentation is concerning for possible sepsis given exam that appears consistent with infection/gangrene to the right fifth toe.  Code sepsis activated, broad-spectrum IV antibiotics ordered.  We will give IV fluids.  Discussed with Dr. Earleen Newport of podiatry who will consult on the patient, patient will be admitted to the hospital service    ____________________________________________   FINAL CLINICAL IMPRESSION(S) / ED DIAGNOSES  Final diagnoses:  Sepsis, due to unspecified organism, unspecified whether acute organ dysfunction present ([place])  Gangrene ([place])        Note:  This document  was prepared using Dragon voice recognition software and may include unintentional dictation errors.    Lavonia Drafts, MD 10/15/20 2028

## 2020-10-15 NOTE — Progress Notes (Deleted)
No show

## 2020-10-15 NOTE — ED Notes (Signed)
Unable to obtain 2nd set of BC at this time. Will notify MD and have lab come draw.

## 2020-10-15 NOTE — Sepsis Progress Note (Signed)
Elink Following Code Sepsis  

## 2020-10-15 NOTE — ED Triage Notes (Signed)
Patient states off of chest pain since yesterday and feeling light headed.  Patient states her blood pressure was low just before dialysis yesterday.  Patient also reports her neuropathy has started acting up.

## 2020-10-15 NOTE — Consult Note (Signed)
PHARMACY -  BRIEF ANTIBIOTIC NOTE   Pharmacy has received consult(s) for vancomycin from an ED provider.  The patient's profile has been reviewed for ht/wt/allergies/indication/available labs.    One time order(s) placed for vancomycin 1000 mg followed by 1500 mg (total loading dose of 2500 mg)  Further antibiotics/pharmacy consults should be ordered by admitting physician if indicated.                       Thank you, Darnelle Bos, PharmD 10/15/2020  8:29 PM

## 2020-10-15 NOTE — H&P (Addendum)
History and Physical    [name] BPZ:[id number] DOB: 1970/06/26 DOA: 10/15/2020  PCP: [name], NP    Patient coming from:  Home    Chief Complaint:  Generalized malaise.    HPI: [name] is a 50 y.o. female seen in ed with complaints of generalized malaise.  Pt noted that her toe has been black and malodorous.Pt is being followed at wound care for her Diabetic foot wound and per patient it has been draining.  Pt has right foot diabetic foot wound since July and ulcer since 4 days.  Today at party was dizzy, not feeling and her bp at dialysis was 88 and took midodrine and got her HD treatment was completed.   Heart was beating was fast, back and legs were hurting bad, ulcer was draining, and was tender, and cant walk on it. Pt has wound care appt tomorrow. Bgt are usually fair and 250's-70 sometimes.   Pt reports chills x 2 days and fever today that she did not know about.  Pt also reports vomiting x 1 month and xray of stomach by pcp and was told she was constipated.   Pt has past medical history of ESRD on HD, DM II,DFU, HTN, Anemia of CKD, COPD.  ED Course:  Vitals:   10/15/20 1905 10/15/20 1911 10/15/20 2052  BP:  (!) 118/91 135/72  Pulse:  (!) 139 (!) 133  Resp:  (!) 22 (!) 23  Temp:  100.3 F (37.9 C)   TempSrc:  Oral   SpO2:  93% 100%  Weight: 126.1 kg    Height: 5' 4.5" (1.638 m)    In ed pt is alert,awake and oxygenating at room air.  Labs shows hyponatremia 127, glucose of 272, creatinine of 8.14, lipase 86, AST 44.  CBC shows elevated wbc count of 14.5, hemoglobin 11.4, platelet count of 434. In ed pt given rocephin and vancomycin.   Review of Systems:  Review of Systems  Constitutional:  Positive for chills, fever and malaise/fatigue.  Cardiovascular:  Positive for palpitations.  Neurological:  Positive for dizziness.    Past Medical History:  Diagnosis Date   (HFpEF) heart failure with preserved ejection fraction ([place])    a.  11/2014 Echo: EF 50-55%, Gr1 DD; b. 06/2016 Echo: EF 60-65%, no rwma, mod dil LA, nl RV, triv eff.   Anxiety    Asthma    Bronchitis    CHF (congestive heart failure) (HCC)    Chronic cough    Chronic respiratory failure with hypoxia and hypercapnia ([place])    a. Home O2.  (2-4L)   CKD (chronic kidney disease), stage III (HCC)    Depression    Diabetes mellitus without complication (HCC)    Diverticulosis    Dyspnea    Dysrhythmia    per pt, no arrythmias   Environmental and seasonal allergies    Extreme obesity with alveolar hypoventilation (HCC)    Hypertension    Iron deficiency anemia    a. chronic blood loss->heavy menses.   Kidney failure    dialysis tues/thur/sat   ... left arm shunt   Orthopnea    Oxygen dependent    3l/min   Pericardial effusion    a. 11/2014 CT Chest: Mod effusion; b. 11/2014 Echo: small to mod circumferential effusion. No hemodynamic compromise.   Pneumonia    history   Poorly controlled diabetes mellitus ([place])    a. 11/2014 A1c 13.2.   Swelling of both lower extremities  Past Surgical History:  Procedure Laterality Date   A/V FISTULAGRAM Left 06/09/2017   Procedure: A/V FISTULAGRAM;  Surgeon: Algernon Huxley, MD;  Location: [facility];  Service: Cardiovascular;  Laterality: Left;   A/V FISTULAGRAM Left 06/16/2017   Procedure: A/V FISTULAGRAM;  Surgeon: Algernon Huxley, MD;  Location: [facility];  Service: Cardiovascular;  Laterality: Left;   A/V FISTULAGRAM Left 01/28/2018   Procedure: A/V FISTULAGRAM;  Surgeon: Algernon Huxley, MD;  Location: [facility];  Service: Cardiovascular;  Laterality: Left;   AV FISTULA PLACEMENT Left 04/25/2017   Procedure: ARTERIOVENOUS (AV) FISTULA CREATION;  Surgeon: Algernon Huxley, MD;  Location: [facility];  Service: Vascular;  Laterality: Left;   CATARACT EXTRACTION W/PHACO Left 01/21/2018   Procedure: CATARACT EXTRACTION PHACO AND INTRAOCULAR LENS PLACEMENT (IOC);  Surgeon: Leandrew Koyanagi,  MD;  Location: [facility];  Service: Ophthalmology;  Laterality: Left;  [place] 01:55.5 CDE 9.05 EAUP 13.9 Fluid Pack Lot # [id number]   CESAREAN SECTION     times 3   DIALYSIS/PERMA CATHETER INSERTION Right 04/04/2017   Procedure: DIALYSIS/PERMA CATHETER INSERTION;  Surgeon: Katha Cabal, MD;  Location: [place] [facility];  Service: Cardiovascular;  Laterality: Right;   DIALYSIS/PERMA CATHETER REMOVAL N/A 09/17/2017   Procedure: DIALYSIS/PERMA CATHETER REMOVAL;  Surgeon: Algernon Huxley, MD;  Location: [facility] [place] [facility];  Service: Cardiovascular;  Laterality: N/A;   INSERTION OF DIALYSIS CATHETER N/A 04/16/2017   Procedure: INSERTION OF DIALYSIS PERM CATHETER;  Surgeon: Algernon Huxley, MD;  Location: [facility];  Service: Vascular;  Laterality: N/A;     reports that she has never smoked. She has never used smokeless tobacco. She reports current alcohol use. She reports current drug use. Drug: Marijuana.  Allergies  Allergen Reactions   Dust Mite Extract Shortness Of Breath and Itching   Eggs Or Egg-Derived Products Itching    MAKES TONGUE ITCH but she will eat, only in small amounts   Mold Extract [Trichophyton] Shortness Of Breath and Itching   Pollen Extract Shortness Of Breath    Itching, shortness of breath, asthma like symptoms   Sulfa Antibiotics Itching   Feraheme [Ferumoxytol] Itching    Uses benadryl so she can get the medicine    Family History  Problem Relation Age of Onset   Diabetes Mother    Hypertension Mother    Hypertension Father    Diabetes Father    Hypertension Other    Diabetes Other    Breast cancer Maternal Aunt 82   Breast cancer Maternal Aunt 50    Prior to Admission medications   Medication Sig Start Date End Date Taking? Authorizing Provider  acetaminophen (TYLENOL) 500 MG tablet Take 1,000 mg by mouth every 6 (six) hours as needed for mild pain, fever or headache.    [provider]  albuterol (PROVENTIL) (2.5 MG/3ML) 0.083% nebulizer solution  Take 2.5 mg by nebulization every 6 (six) hours as needed for wheezing or shortness of breath.    [provider]  albuterol (VENTOLIN HFA) 108 (90 Base) MCG/ACT inhaler Inhale 2 puffs into the lungs every 4 (four) hours as needed for wheezing or shortness of breath. 12/30/19   Tyler Pita, MD  aspirin EC 81 MG tablet Take 81 mg by mouth daily.     [provider]  atorvastatin (LIPITOR) 80 MG tablet Take 80 mg by mouth daily at 6 PM.    [provider]  b complex-vitamin c-folic acid (NEPHRO-VITE)  0.8 MG TABS tablet Take 1 tablet by mouth at bedtime.    [provider]  Cholecalciferol (HM VITAMIN D3) 4000 units CAPS Take 1 capsule by mouth once a week.    [provider]  cloNIDine (CATAPRES) 0.2 MG tablet Take 1 tablet (0.2 mg total) by mouth 2 (two) times daily. Patient taking differently: Take 0.2 mg by mouth 2 (two) times daily.  01/23/17   Alisa Graff, FNP  docusate sodium (COLACE) 100 MG capsule Take 100 mg by mouth daily.    [provider]  ELDERBERRY PO Take by mouth daily.    [provider]  ferric citrate (AURYXIA) 1 GM 210 MG(Fe) tablet Take 210 mg by mouth 3 (three) times daily with meals.    [provider]  insulin aspart (NOVOLOG) 100 UNIT/ML injection Inject 0-5 Units into the skin at bedtime. 04/26/17   Epifanio Lesches, MD  insulin aspart (NOVOLOG) 100 UNIT/ML injection Inject 0-15 Units into the skin 3 (three) times daily with meals. 04/26/17   Epifanio Lesches, MD  insulin degludec (TRESIBA) 100 UNIT/ML SOPN FlexTouch Pen Inject 36 Units into the skin daily at 10 pm.     [provider]  ipratropium-albuterol (DUONEB) 0.5-2.5 (3) MG/3ML SOLN Take 3 mLs by nebulization every 6 (six) hours as needed. 03/10/17   Salary, Avel Peace, MD  lactulose (CHRONULAC) 10 GM/15ML solution Take 30 mLs (20 g total) by mouth daily as needed for severe constipation. 04/26/17   Epifanio Lesches, MD   lidocaine-prilocaine (EMLA) cream Apply topically as needed (For access for dialysis).    [provider]  losartan (COZAAR) 50 MG tablet Take 50 mg by mouth 2 (two) times daily. On dialysis days, patient is taking in the evening    [provider]  metoprolol tartrate (LOPRESSOR) 100 MG tablet Take 1 tablet (100 mg total) by mouth 2 (two) times daily. 11/16/16   Bettey Costa, MD  montelukast (SINGULAIR) 10 MG tablet Take 1 tablet (10 mg total) by mouth daily. 10/13/19 01/11/20  Tyler Pita, MD  ondansetron (ZOFRAN ODT) 4 MG disintegrating tablet Take 1 tablet (4 mg total) by mouth every 8 (eight) hours as needed for nausea or vomiting. 05/27/20   Harvest Dark, MD  oxyCODONE-acetaminophen (PERCOCET) 5-325 MG tablet Take 1 tablet by mouth every 4 (four) hours as needed for severe pain. 05/27/20   Harvest Dark, MD  OXYGEN Inhale 2-4 L into the lungs.    [provider]  OZEMPIC, 0.25 OR 0.5 MG/DOSE, 2 MG/1.5ML SOPN  04/03/19   [provider]  polyethylene glycol (MIRALAX / GLYCOLAX) packet Take 17 g by mouth daily as needed for mild constipation. 06/29/16   Vaughan Basta, MD  torsemide (DEMADEX) 100 MG tablet Take 1 tablet (100 mg total) by mouth 2 (two) times daily. 03/10/17   Salary, Holly Bodily D, MD  TRELEGY ELLIPTA 200-62.5-25 MCG/INH AEPB INHALE 1 PUFF BY MOUTH ONCE DAILY. APPOINTMENT NEEDED FOR FURTHER REFILLS. 10/07/20   Tyler Pita, MD    Physical Exam: Vitals:   10/15/20 1905 10/15/20 1911 10/15/20 2052  BP:  (!) 118/91 135/72  Pulse:  (!) 139 (!) 133  Resp:  (!) 22 (!) 23  Temp:  100.3 F (37.9 C)   TempSrc:  Oral   SpO2:  93% 100%  Weight: 126.1 kg    Height: 5' 4.5" (1.638 m)     Physical Exam Vitals and nursing note reviewed.  Constitutional:      General: She  is not in acute distress.    Appearance: She is obese. She is not ill-appearing.  HENT:     Head: Normocephalic and atraumatic.     Right Ear: External ear  normal.     Left Ear: External ear normal.     Nose: Nose normal.     Mouth/Throat:     Mouth: Mucous membranes are moist.  Eyes:     Extraocular Movements: Extraocular movements intact.     Pupils: Pupils are equal, round, and reactive to light.  Neck:     Vascular: No carotid bruit.  Cardiovascular:     Rate and Rhythm: Normal rate and regular rhythm.     Pulses:          Dorsalis pedis pulses are 1+ on the right side and 1+ on the left side.       Posterior tibial pulses are 1+ on the right side and 1+ on the left side.     Heart sounds: Normal heart sounds.  Pulmonary:     Effort: Pulmonary effort is normal.     Breath sounds: Normal breath sounds.  Abdominal:     General: Bowel sounds are normal. There is no distension.     Palpations: Abdomen is soft. There is no mass.     Tenderness: There is no abdominal tenderness. There is no guarding.     Hernia: No hernia is present.  Musculoskeletal:     Right lower leg: No edema.     Left lower leg: No edema.       Feet:  Neurological:     General: No focal deficit present.     Mental Status: She is alert and oriented to person, place, and time.  Psychiatric:        Mood and Affect: Mood normal.        Behavior: Behavior normal. Behavior is cooperative.   Labs on Admission: I have personally reviewed following labs and imaging studies  No results for input(s): CKTOTAL, CKMB, TROPONINI in the last 72 hours. Lab Results  Component Value Date   WBC 22.1 (H) 10/15/2020   HGB 9.8 (L) 10/15/2020   HCT 30.6 (L) 10/15/2020   MCV 96.5 10/15/2020   PLT 358 10/15/2020   No results for input(s): NA, K, CL, CO2, BUN, CREATININE, CALCIUM, PROT, BILITOT, ALKPHOS, ALT, AST, GLUCOSE in the last 168 hours.  Invalid input(s): LABALBU Lab Results  Component Value Date   CHOL 232 (H) 01/03/2015   HDL 41 01/03/2015   LDLCALC 169 (H) 01/03/2015   TRIG 112 01/03/2015   No results found for: DDIMER Invalid input(s):  POCBNP  Urinalysis    Component Value Date/Time   COLORURINE YELLOW (A) 04/02/2017 1548   APPEARANCEUR HAZY (A) 04/02/2017 1548   LABSPEC 1.008 04/02/2017 1548   PHURINE 5.0 04/02/2017 1548   GLUCOSEU NEGATIVE 04/02/2017 1548   HGBUR NEGATIVE 04/02/2017 1548   [place] 04/02/2017 1548   KETONESUR NEGATIVE 04/02/2017 1548   PROTEINUR 100 (A) 04/02/2017 1548   NITRITE NEGATIVE 04/02/2017 1548   LEUKOCYTESUR NEGATIVE 04/02/2017 1548   COVID-19 Labs No results for input(s): DDIMER, FERRITIN, LDH, CRP in the last 72 hours. Lab Results  Component Value Date   [place] NEGATIVE 05/27/2020    Radiological Exams on Admission: DG Chest 2 View  Result Date: 10/15/2020 CLINICAL DATA:  Chest pain. EXAM: CHEST - 2 VIEW COMPARISON:  Chest radiograph dated 02/12/2018. FINDINGS: No focal consolidation, pleural effusion or pneumothorax. The cardiac silhouette is  within normal limits. No acute osseous pathology. IMPRESSION: No active cardiopulmonary disease. Electronically Signed   By: Anner Crete M.D.   On: 10/15/2020 19:48   DG Foot 2 Views Right  Result Date: 10/15/2020 CLINICAL DATA:  Fifth toe infection.  Evaluate for osteomyelitis. EXAM: RIGHT FOOT - 2 VIEW COMPARISON:  None. FINDINGS: There is no acute fracture or dislocation. No bone erosion or periosteal elevation to suggest acute osteomyelitis. The bones are osteopenic. Mild diffuse subcutaneous edema. Extensive vascular calcification of the foot. No radiopaque foreign object or soft tissue gas. IMPRESSION: 1. No acute fracture or dislocation. No radiographic evidence of acute osteomyelitis. 2. Osteopenia. Electronically Signed   By: Anner Crete M.D.   On: 10/15/2020 20:12    EKG: Independently reviewed.  ST 139, qtc 419, PR- 144.  Echocardiogram 11/2014 - Procedure narrative: Transthoracic echocardiography. Image    quality was suboptimal.  - Left ventricle: The cavity size was normal. There was moderate     concentric hypertrophy. Systolic function was normal. The    estimated ejection fraction was in the range of 50% to 55%. Wall    motion was normal; there were no regional wall motion    abnormalities. Doppler parameters are consistent with abnormal    left ventricular relaxation (grade 1 diastolic dysfunction).  - Left atrium: The atrium was mildly dilated.  - Pericardium, extracardiac: A small to moderate pericardial    effusion was identified circumferential to the heart. There was    no evidence of hemodynamic compromise.    Assessment/Plan Principal Problem:   Diabetic foot ulcer ([place]) Active Problems:   Decreased pedal pulses   Diabetes (HCC)   Essential hypertension   DNR (do not resuscitate) discussion   End-stage renal disease on hemodialysis ([place])   Chronic respiratory failure with hypoxia and hypercapnia ([place])   Diabetic Foot ulcer: Podiatrist has been consulted by EDMD. Pt is s/p iv abx. Pharmacy consult for abx. We will obtain ABI as pulse is 1+ but difficult to feel due to edema.  Decreased pedal pulse: ABI .  DM II: A1c, SSI with glycemic protocol.  Ozempic discontinued as pt had pancreatitis.  Home insulin regimen held.  HTN: Blood pressure 135/72, pulse (!) 133, temperature 100.3 F (37.9 C), temperature source Oral, resp. rate (!) 23, height 5' 4.5" (1.638 m), weight 126.1 kg, last menstrual period 09/06/2016, SpO2 100 %. We will  hold metoprolol ,torsemide, losartan,and clonidine due to hypotension.   DNR: Pt states she wants to be full code.  ESRD on HD: Nephrology consult per am team.   C/H respiratory failure: Pt to cont her home 3 L oxygen regimen.    DVT prophylaxis:  Heparin     Code Status:  FULL CODE   Family Communication:  [place], Hagarty (Spouse)  [phone] (Home Phone)   Disposition Plan:  Home    Consults called:  None   Admission status: Inpatient     Para Skeans MD [facility] [phone] How to  contact the [facility] Attending or Consulting provider [place] or covering provider during after hours [place], for this patient.    Check the care team in [facility] and look for a) attending/consulting [facility] provider listed and b) the [facility] team listed Log into [email or web address] and use [place]'s universal password to access. If you do not have the password, please contact the hospital operator. Locate the [facility] provider you are looking for under [facility] and page to a number that you can be  directly reached. If you still have difficulty reaching the provider, please page the [facility] (Director on Call) for the Hospitalists listed on amion for assistance. [email or web address] Password [facility] 10/15/2020, 9:42 PM

## 2020-10-16 ENCOUNTER — Ambulatory Visit: Payer: Medicare HMO | Admitting: Cardiovascular Disease

## 2020-10-16 ENCOUNTER — Ambulatory Visit: Payer: Medicare HMO | Admitting: Physician Assistant

## 2020-10-16 ENCOUNTER — Inpatient Hospital Stay: Payer: Medicare HMO

## 2020-10-16 ENCOUNTER — Encounter: Payer: Self-pay | Admitting: Internal Medicine

## 2020-10-16 DIAGNOSIS — E11621 Type 2 diabetes mellitus with foot ulcer: Secondary | ICD-10-CM | POA: Diagnosis not present

## 2020-10-16 DIAGNOSIS — I10 Essential (primary) hypertension: Secondary | ICD-10-CM

## 2020-10-16 DIAGNOSIS — I5032 Chronic diastolic (congestive) heart failure: Secondary | ICD-10-CM

## 2020-10-16 DIAGNOSIS — J9611 Chronic respiratory failure with hypoxia: Secondary | ICD-10-CM | POA: Diagnosis not present

## 2020-10-16 DIAGNOSIS — E1122 Type 2 diabetes mellitus with diabetic chronic kidney disease: Secondary | ICD-10-CM

## 2020-10-16 DIAGNOSIS — N186 End stage renal disease: Secondary | ICD-10-CM

## 2020-10-16 DIAGNOSIS — R0989 Other specified symptoms and signs involving the circulatory and respiratory systems: Secondary | ICD-10-CM | POA: Diagnosis not present

## 2020-10-16 LAB — HEMOGLOBIN A1C
Hgb A1c MFr Bld: 8.9 % — ABNORMAL HIGH (ref 4.8–5.6)
Mean Plasma Glucose: 208.73 mg/dL

## 2020-10-16 LAB — GLUCOSE, CAPILLARY
Glucose-Capillary: 291 mg/dL — ABNORMAL HIGH (ref 70–99)
Glucose-Capillary: 252 mg/dL — ABNORMAL HIGH (ref 70–99)

## 2020-10-16 LAB — CBC
HCT: 28.5 % — ABNORMAL LOW (ref 36.0–46.0)
Hemoglobin: 8.9 g/dL — ABNORMAL LOW (ref 12.0–15.0)
MCH: 30.5 pg (ref 26.0–34.0)
MCHC: 31.2 g/dL (ref 30.0–36.0)
MCV: 97.6 fL (ref 80.0–100.0)
Platelets: 325 10*3/uL (ref 150–400)
RBC: 2.92 MIL/uL — ABNORMAL LOW (ref 3.87–5.11)
RDW: 13.5 % (ref 11.5–15.5)
WBC: 21.4 10*3/uL — ABNORMAL HIGH (ref 4.0–10.5)
nRBC: 0 % (ref 0.0–0.2)

## 2020-10-16 LAB — COMPREHENSIVE METABOLIC PANEL
ALT: 47 U/L — ABNORMAL HIGH (ref 0–44)
AST: 45 U/L — ABNORMAL HIGH (ref 15–41)
Albumin: 2.7 g/dL — ABNORMAL LOW (ref 3.5–5.0)
Alkaline Phosphatase: 474 U/L — ABNORMAL HIGH (ref 38–126)
Anion gap: 14 (ref 5–15)
BUN: 55 mg/dL — ABNORMAL HIGH (ref 6–20)
CO2: 24 mmol/L (ref 22–32)
Calcium: 9.1 mg/dL (ref 8.9–10.3)
Chloride: 94 mmol/L — ABNORMAL LOW (ref 98–111)
Creatinine, Ser: 9.18 mg/dL — ABNORMAL HIGH (ref 0.44–1.00)
GFR, Estimated: 5 mL/min — ABNORMAL LOW (ref >60)
Glucose, Bld: 217 mg/dL — ABNORMAL HIGH (ref 70–99)
Potassium: 4.5 mmol/L (ref 3.5–5.1)
Sodium: 132 mmol/L — ABNORMAL LOW (ref 135–145)
Total Bilirubin: 0.9 mg/dL (ref 0.3–1.2)
Total Protein: 7.4 g/dL (ref 6.5–8.1)

## 2020-10-16 LAB — PROCALCITONIN: Procalcitonin: 3.26 ng/mL

## 2020-10-16 LAB — BASIC METABOLIC PANEL
Anion gap: 19 — ABNORMAL HIGH (ref 5–15)
BUN: 60 mg/dL — ABNORMAL HIGH (ref 6–20)
CO2: 19 mmol/L — ABNORMAL LOW (ref 22–32)
Calcium: 9.1 mg/dL (ref 8.9–10.3)
Chloride: 90 mmol/L — ABNORMAL LOW (ref 98–111)
Creatinine, Ser: 9.75 mg/dL — ABNORMAL HIGH (ref 0.44–1.00)
GFR, Estimated: 4 mL/min — ABNORMAL LOW (ref >60)
Glucose, Bld: 295 mg/dL — ABNORMAL HIGH (ref 70–99)
Potassium: 5 mmol/L (ref 3.5–5.1)
Sodium: 128 mmol/L — ABNORMAL LOW (ref 135–145)

## 2020-10-16 LAB — HIV ANTIBODY (ROUTINE TESTING W REFLEX): HIV Screen 4th Generation wRfx: NONREACTIVE

## 2020-10-16 LAB — TROPONIN I (HIGH SENSITIVITY)
Troponin I (High Sensitivity): 57 ng/L — ABNORMAL HIGH (ref <18)
Troponin I (High Sensitivity): 63 ng/L — ABNORMAL HIGH (ref <18)

## 2020-10-16 IMAGING — MR MR FOOT*R* W/O CM
5 series · 40 of 40 positions shown · non-contrast
Comparison: None.

CLINICAL DATA: History of diabetes and end-stage renal disease.
Soft tissue wound of the lateral forefoot.

EXAM:
MRI OF THE RIGHT FOREFOOT WITHOUT CONTRAST
TECHNIQUE: Multiplanar, multisequence MR imaging of the right foot was
performed. No intravenous contrast was administered.

[Series 3: T1 · coronal · right · 3.0mm · 0.38mm/px · 11 of 45 slices shown (1 of 2)]
[im 1/45]
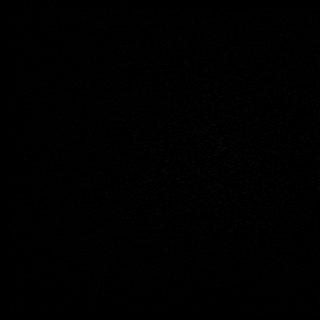
[im 5/45]
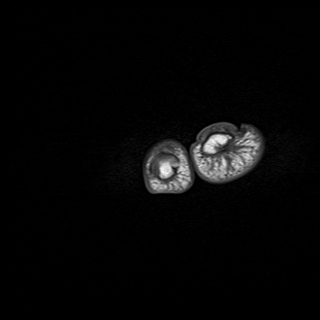
[im 9/45]
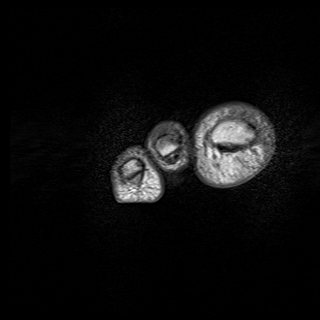
[im 14/45]
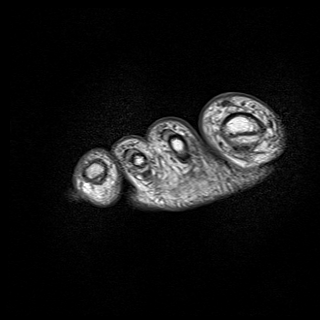
[im 18/45]
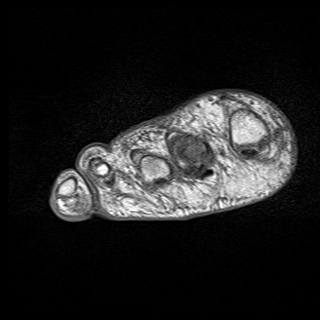
[im 23/45]
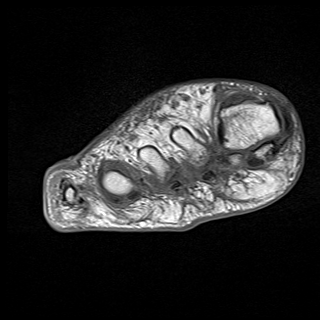
[im 27/45]
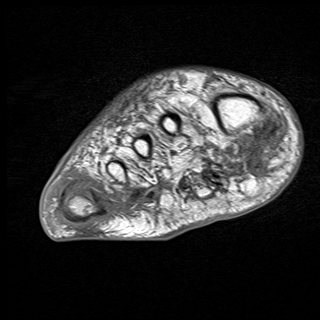
[im 31/45]
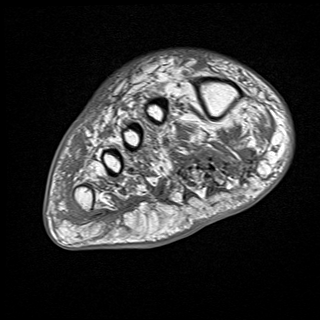
[im 36/45]
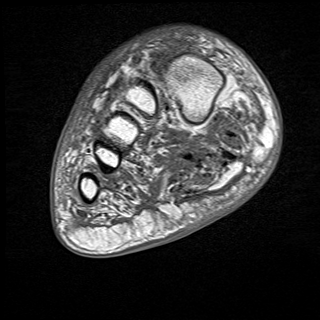
[im 40/45]
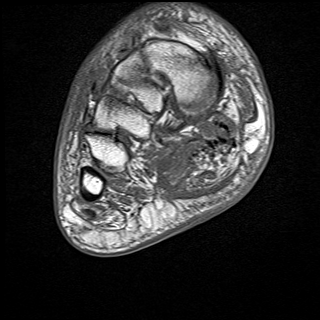
[im 45/45]
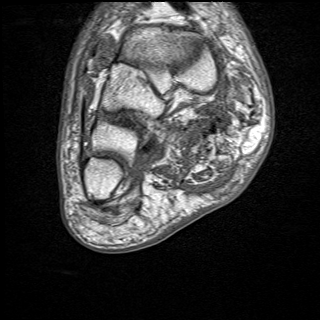

[Series 5: T2 · coronal · right · 3.0mm · 0.38mm/px · 11 of 45 slices shown (1 of 2)]
[im 1/45]
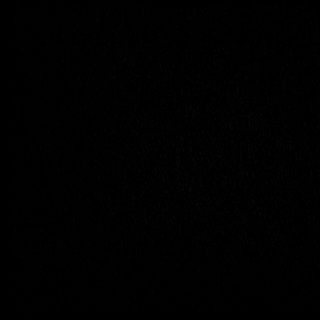
[im 5/45]
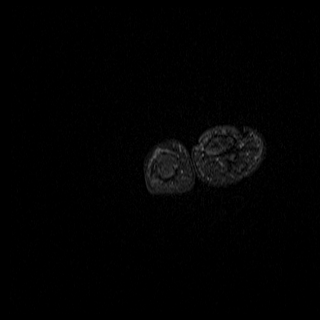
[im 9/45]
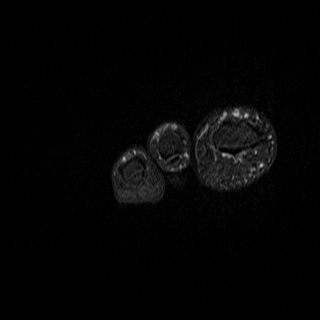
[im 14/45]
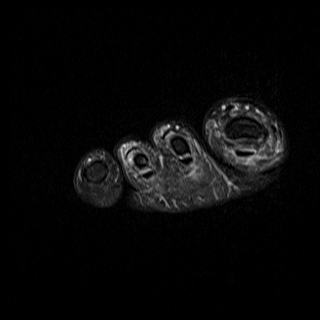
[im 18/45]
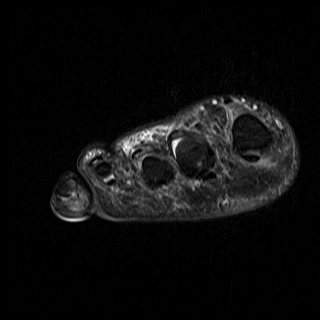
[im 23/45]
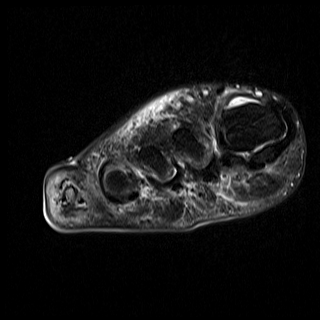
[im 27/45]
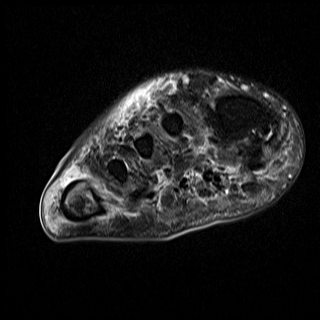
[im 31/45]
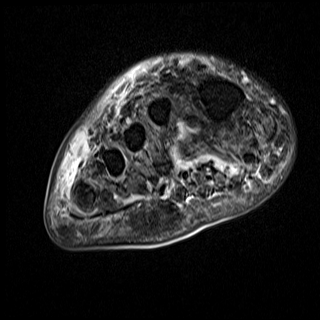
[im 36/45]
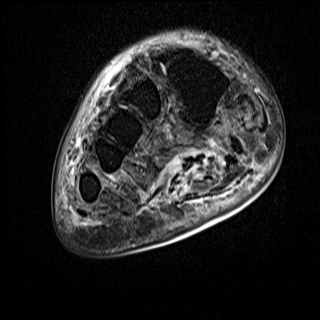
[im 40/45]
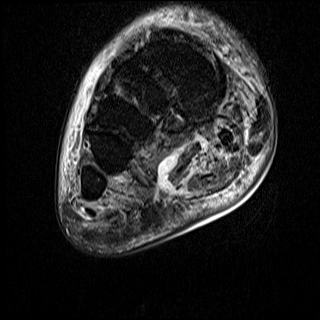
[im 45/45]
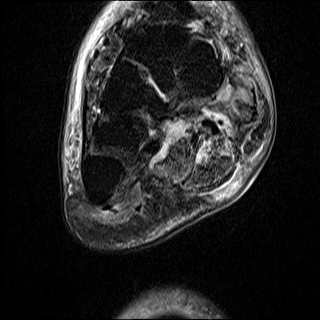

[Series 6: T1 · axial · right · 3.0mm · 0.70mm/px · z∈[-90,-27]mm · 5 of 20 slices shown (2 of 2)]
[im 1/20]
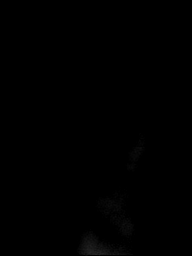
[im 5/20]
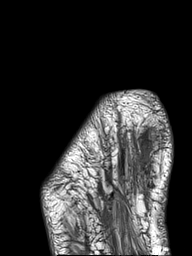
[im 10/20]
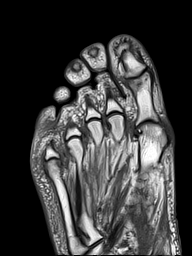
[im 15/20]
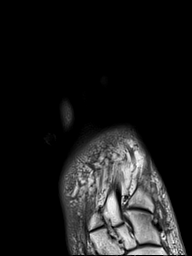
[im 20/20]
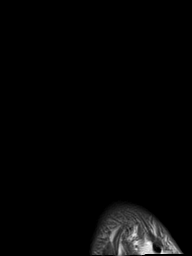

[Series 8: T2 · axial · right · 3.0mm · 0.70mm/px · z∈[-90,-27]mm · 5 of 20 slices shown (2 of 2)]
[im 1/20]
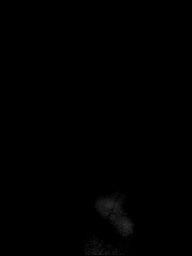
[im 5/20]
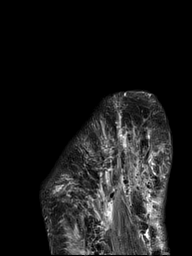
[im 10/20]
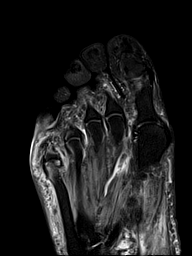
[im 15/20]
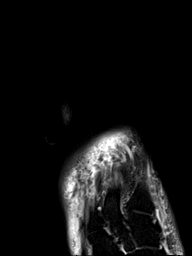
[im 20/20]
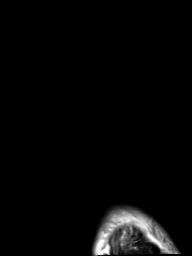

[Series 9: STIR · sagittal · right · 3.0mm · 0.70mm/px · 8 of 33 slices shown]
[im 1/33]
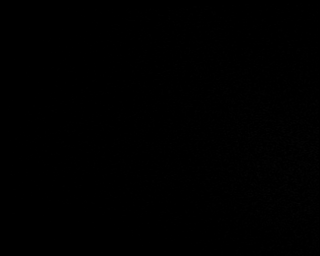
[im 5/33]
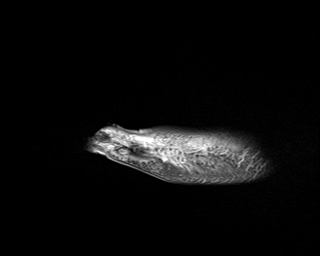
[im 10/33]
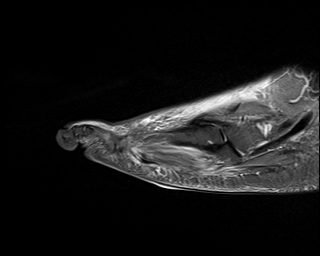
[im 14/33]
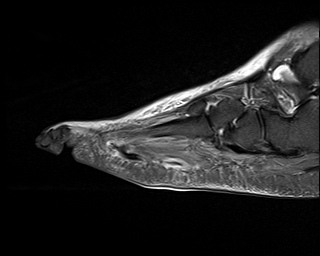
[im 19/33]
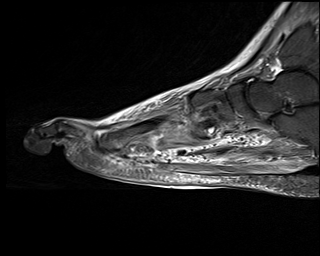
[im 23/33]
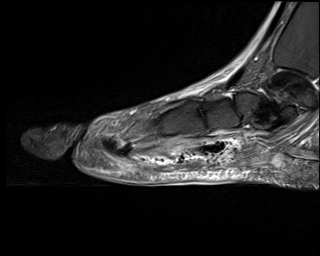
[im 28/33]
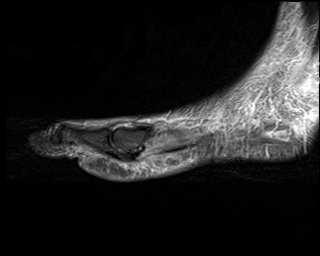
[im 33/33]
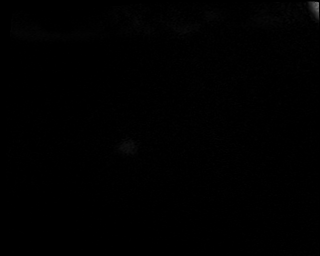

[40 of 40 positions shown; findings below may reference images not displayed]

FINDINGS: Bones/Joint/Cartilage

Soft tissue wound of the fifth digit along the dorsal aspect. Bone
marrow edema in the fifth metatarsal head and fifth proximal phalanx
concerning for osteomyelitis.

No acute fracture or dislocation. Normal alignment. No joint
effusion. Mild osteoarthritis of the first MTP joint.

Ligaments

Collateral ligaments are intact. Lisfranc ligament is intact.

Muscles and Tendons

Flexor, peroneal and extensor compartment tendons are intact. There
are small foci of low signal within the plantar musculature
concerning for soft tissue air as can be seen with necrotizing
infection. Mild edema within the plantar musculature.

Soft tissue
No fluid collection or hematoma. No soft tissue mass. Soft tissue
edema throughout the dorsal aspect of the foot which may be reactive
versus secondary to cellulitis.
IMPRESSION: 1. Soft tissue wound of the fifth digit along the dorsal aspect.
Bone marrow edema in the fifth metatarsal head and fifth proximal
phalanx concerning for osteomyelitis.
2. There are small foci of low signal within the plantar musculature
concerning for air as can be seen with necrotizing infection.
3. Soft tissue edema throughout the dorsal aspect of the foot which
may be reactive versus secondary to cellulitis.

## 2020-10-16 MED ORDER — INSULIN ASPART 100 UNIT/ML IJ SOLN
0.0000 [IU] | Freq: Every day | INTRAMUSCULAR | Status: DC
  Administered 2020-10-18 – 2020-10-24 (×5): 2 [IU] via SUBCUTANEOUS
  Administered 2020-10-26: 3 [IU] via SUBCUTANEOUS
  Administered 2020-10-27: 2 [IU] via SUBCUTANEOUS
  Administered 2020-10-29 – 2020-11-05 (×5): 3 [IU] via SUBCUTANEOUS
  Administered 2020-11-07: 4 [IU] via SUBCUTANEOUS
  Filled 2020-10-16 (×14): qty 1

## 2020-10-16 MED ORDER — DM-GUAIFENESIN ER 30-600 MG PO TB12
1.0000 | ORAL_TABLET | Freq: Two times a day (BID) | ORAL | Status: DC | PRN
  Administered 2020-10-16 – 2020-11-09 (×3): 1 via ORAL
  Filled 2020-10-16 (×5): qty 1

## 2020-10-16 MED ORDER — METRONIDAZOLE 500 MG/100ML IV SOLN
500.0000 mg | Freq: Three times a day (TID) | INTRAVENOUS | Status: DC
  Administered 2020-10-16 (×2): 500 mg via INTRAVENOUS
  Filled 2020-10-16 (×3): qty 100

## 2020-10-16 MED ORDER — VANCOMYCIN HCL IN DEXTROSE 1-5 GM/200ML-% IV SOLN
1000.0000 mg | INTRAVENOUS | Status: DC
  Administered 2020-10-17 – 2020-10-19 (×2): 1000 mg via INTRAVENOUS
  Filled 2020-10-16 (×2): qty 200

## 2020-10-16 MED ORDER — ALUM & MAG HYDROXIDE-SIMETH 200-200-20 MG/5ML PO SUSP
30.0000 mL | ORAL | Status: DC | PRN
  Administered 2020-10-16: 30 mL via ORAL
  Filled 2020-10-16: qty 30

## 2020-10-16 MED ORDER — SODIUM CHLORIDE 0.9 % IV SOLN: INTRAVENOUS | Status: DC

## 2020-10-16 MED ORDER — INSULIN ASPART 100 UNIT/ML IJ SOLN
2.0000 [IU] | Freq: Three times a day (TID) | INTRAMUSCULAR | Status: DC
  Administered 2020-10-16 – 2020-10-25 (×23): 2 [IU] via SUBCUTANEOUS
  Filled 2020-10-16 (×24): qty 1

## 2020-10-16 MED ORDER — METOPROLOL TARTRATE 5 MG/5ML IV SOLN
5.0000 mg | INTRAVENOUS | Status: DC | PRN
  Administered 2020-10-16 – 2020-11-08 (×3): 5 mg via INTRAVENOUS
  Filled 2020-10-16 (×3): qty 5

## 2020-10-16 MED ORDER — INSULIN ASPART 100 UNIT/ML IJ SOLN
0.0000 [IU] | Freq: Three times a day (TID) | INTRAMUSCULAR | Status: DC
  Administered 2020-10-16: 5 [IU] via SUBCUTANEOUS
  Administered 2020-10-17 (×2): 3 [IU] via SUBCUTANEOUS
  Administered 2020-10-17: 1 [IU] via SUBCUTANEOUS
  Administered 2020-10-18: 3 [IU] via SUBCUTANEOUS
  Administered 2020-10-19: 7 [IU] via SUBCUTANEOUS
  Administered 2020-10-19: 1 [IU] via SUBCUTANEOUS
  Administered 2020-10-19: 2 [IU] via SUBCUTANEOUS
  Administered 2020-10-20: 3 [IU] via SUBCUTANEOUS
  Administered 2020-10-20 (×2): 5 [IU] via SUBCUTANEOUS
  Administered 2020-10-21: 2 [IU] via SUBCUTANEOUS
  Administered 2020-10-22 – 2020-10-23 (×4): 3 [IU] via SUBCUTANEOUS
  Administered 2020-10-23: 1 [IU] via SUBCUTANEOUS
  Administered 2020-10-24: 7 [IU] via SUBCUTANEOUS
  Administered 2020-10-24 (×2): 2 [IU] via SUBCUTANEOUS
  Administered 2020-10-25 (×2): 1 [IU] via SUBCUTANEOUS
  Administered 2020-10-25: 2 [IU] via SUBCUTANEOUS
  Administered 2020-10-26 – 2020-10-27 (×2): 3 [IU] via SUBCUTANEOUS
  Administered 2020-10-27: 2 [IU] via SUBCUTANEOUS
  Administered 2020-10-27: 3 [IU] via SUBCUTANEOUS
  Administered 2020-10-28 (×2): 2 [IU] via SUBCUTANEOUS
  Administered 2020-10-28: 5 [IU] via SUBCUTANEOUS
  Administered 2020-10-29 (×2): 3 [IU] via SUBCUTANEOUS
  Administered 2020-10-29 – 2020-10-30 (×2): 2 [IU] via SUBCUTANEOUS
  Administered 2020-10-30: 5 [IU] via SUBCUTANEOUS
  Administered 2020-10-30: 2 [IU] via SUBCUTANEOUS
  Administered 2020-10-31: 3 [IU] via SUBCUTANEOUS
  Administered 2020-10-31 (×2): 2 [IU] via SUBCUTANEOUS
  Administered 2020-11-01 (×2): 3 [IU] via SUBCUTANEOUS
  Administered 2020-11-01: 2 [IU] via SUBCUTANEOUS
  Administered 2020-11-02: 3 [IU] via SUBCUTANEOUS
  Administered 2020-11-02: 1 [IU] via SUBCUTANEOUS
  Administered 2020-11-02 – 2020-11-03 (×2): 2 [IU] via SUBCUTANEOUS
  Administered 2020-11-03 (×2): 3 [IU] via SUBCUTANEOUS
  Administered 2020-11-04 (×2): 2 [IU] via SUBCUTANEOUS
  Administered 2020-11-04: 3 [IU] via SUBCUTANEOUS
  Administered 2020-11-05 (×2): 2 [IU] via SUBCUTANEOUS
  Administered 2020-11-05: 1 [IU] via SUBCUTANEOUS
  Administered 2020-11-06 – 2020-11-07 (×5): 2 [IU] via SUBCUTANEOUS
  Administered 2020-11-08: 3 [IU] via SUBCUTANEOUS
  Administered 2020-11-08 (×2): 1 [IU] via SUBCUTANEOUS
  Administered 2020-11-09 – 2020-11-10 (×3): 3 [IU] via SUBCUTANEOUS
  Administered 2020-11-10: 1 [IU] via SUBCUTANEOUS
  Filled 2020-10-16 (×66): qty 1

## 2020-10-16 MED ORDER — TRAZODONE HCL 50 MG PO TABS
50.0000 mg | ORAL_TABLET | Freq: Every evening | ORAL | Status: DC | PRN
  Administered 2020-10-17 – 2020-11-09 (×10): 50 mg via ORAL
  Filled 2020-10-16 (×12): qty 1

## 2020-10-16 MED ORDER — CHLORHEXIDINE GLUCONATE CLOTH 2 % EX PADS
6.0000 | MEDICATED_PAD | Freq: Every day | CUTANEOUS | Status: DC
  Administered 2020-10-17 – 2020-11-09 (×22): 6 via TOPICAL

## 2020-10-16 MED ORDER — OXYCODONE HCL 5 MG PO TABS
5.0000 mg | ORAL_TABLET | ORAL | Status: DC | PRN
Start: 2020-10-16 — End: 2020-10-29
  Administered 2020-10-16 – 2020-10-29 (×28): 5 mg via ORAL
  Filled 2020-10-16 (×29): qty 1

## 2020-10-16 MED ORDER — SODIUM CHLORIDE 0.9 % IV SOLN
1.0000 g | INTRAVENOUS | Status: DC
  Administered 2020-10-16 – 2020-10-19 (×4): 1 g via INTRAVENOUS
  Filled 2020-10-16 (×4): qty 1

## 2020-10-16 MED ORDER — CALCIUM CARBONATE ANTACID 500 MG PO CHEW
1.0000 | CHEWABLE_TABLET | Freq: Three times a day (TID) | ORAL | Status: DC | PRN
  Administered 2020-10-16: 200 mg via ORAL
  Filled 2020-10-16: qty 1

## 2020-10-16 MED ORDER — HYDRALAZINE HCL 20 MG/ML IJ SOLN: 10.0000 mg | INTRAMUSCULAR | Status: DC | PRN

## 2020-10-16 MED ORDER — PANTOPRAZOLE SODIUM 40 MG PO TBEC
40.0000 mg | DELAYED_RELEASE_TABLET | Freq: Every day | ORAL | Status: DC
  Administered 2020-10-16: 40 mg via ORAL
  Filled 2020-10-16: qty 1

## 2020-10-16 NOTE — Treatment Plan (Signed)
Again attempted to see patient but was away for MRI. I spoke with her by phone. Dr Amalia Hailey will see tomorrow for full consult   Lanae Crumbly, [facility] 10/16/2020

## 2020-10-16 NOTE — Progress Notes (Signed)
PT Cancellation Note  Patient Details Name: [name] MRN: [id number] DOB: 07/14/1970   Cancelled Treatment:    Reason Eval/Treat Not Completed: Other (comment) PT orders received, chart reviewed. Attempted to see pt for PT evaluation but pt strongly declines participation at this time, citing fatigue. Will f/u as able & as pt is willing to participate.  Lavone Nian, PT, DPT 10/16/20, 1:13 PM    [name] 10/16/2020, 1:12 PM

## 2020-10-16 NOTE — Consult Note (Signed)
Pharmacy Antibiotic Note  [name] is a 50 y.o. female w/ h/o ESRD on HD, DM II, DFU, HTN, Anemia of CKD, COPDadmitted on 10/15/2020 with sepsis, cellulitis, and Diabetic foot infection .  Pharmacy has been consulted for vancomycin and cefepime dosing.  Plan: Vancomycin 2.5g x1 loading dose (1g at 2145;1.5g at 2316); then 1g post-HD on HD days. Per nephrology, will continue Tuesday, Thursday, Saturday schedule F/u MRSA PCR and cultures. Cefepime 1g IV Q 24 hours  Height: 5' 4.5" (163.8 cm) Weight: 126.1 kg (278 lb) IBW/kg (Calculated) : 55.85  Temp (24hrs), Avg:99.3 F (37.4 C), Min:98.4 F (36.9 C), Max:100.3 F (37.9 C)  Recent Labs  Lab 10/15/20 1958 10/16/20 0347 10/16/20 1402  WBC 22.1*  --  21.4*  CREATININE 8.88* 9.18* 9.75*  LATICACIDVEN 1.8  --   --      Estimated Creatinine Clearance: 9.3 mL/min (A) (by C-G formula based on SCr of 9.75 mg/dL (H)).    Allergies  Allergen Reactions   Dust Mite Extract Shortness Of Breath and Itching   Eggs Or Egg-Derived Products Itching    MAKES TONGUE ITCH but she will eat, only in small amounts   Mold Extract [Trichophyton] Shortness Of Breath and Itching   Pollen Extract Shortness Of Breath    Itching, shortness of breath, asthma like symptoms   Sulfa Antibiotics Itching   Feraheme [Ferumoxytol] Itching    Uses benadryl so she can get the medicine    Antimicrobials this admission: Ceftriaxone (8/21 >> 8/22 Vancomycin (8/21 >>  Cefepime (8/22 >>  Dose adjustments this admission: ESRD-HD (inpatient schedule TBD)  Microbiology results: 8/21 BCx: NG<12 hours 8/21 UCx: pending  8/22 MRSA PCR: ordered  Thank you for allowing pharmacy to be a part of this patient's care.  Pearla Dubonnet, PharmD Clinical Pharmacist 10/16/2020 2:59 PM

## 2020-10-16 NOTE — Consult Note (Signed)
[place] Nurse Consult Note: Patient receiving care in [facility]. Podiatrist attempted to see earlier, but patient was in Vascular Lab. Reason for Consult: Malodorous, draining right 5th toe Wound type: to be determined Pressure Injury POA: Yes/No/NA Measurement: Wound bed: Drainage (amount, consistency, odor)  Periwound: darkened. Dressing procedure/placement/frequency: Wrap right forefoot in kerlix. Change every shift. Can place dry gauze between toes.  Chart reviewed in completing this consult.  MRI of foot has been ordered. Xray from yesterday does not indicate any type of acute osteomyelitis. [place] nurse will not follow at this time.  Please re-consult the [place] team if needed.  Val Riles, RN, MSN, CWOCN, CNS-BC, pager [phone]

## 2020-10-16 NOTE — Progress Notes (Addendum)
PROGRESS NOTE    [name]  WNU:[id number] DOB: February 21, 1971 DOA: 10/15/2020 PCP: [name], NP   Brief Narrative:  50 year old with history of ESRD on HD, HTN, diabetes, chronic hypoxia on 3 L nasal cannula comes to the hospital for worsening foot infection, drainage from her diabetic foot ulcer.  Started on IV antibiotics, podiatry team consulted.   Assessment & Plan:   Principal Problem:   Diabetic foot ulcer ([place]) Active Problems:   Diabetes ([place])   Essential hypertension   DNR (do not resuscitate) discussion   End-stage renal disease on hemodialysis ([place])   Chronic respiratory failure with hypoxia and hypercapnia (HCC)   Decreased pedal pulses  Diabetic foot ulcer Decreased pedal pulses -Admitted for IV antibiotics-changed to cefepime. Will order Right Foot.  - Podiatry consulted - ABI-moderate occlusive disease bilateral lower extremity - Pain control, bowel regimen -Concerns about PVD therefore vascular team consulted  Diabetes mellitus type 2 - Insulin sliding scale and Accu-Chek.  Glargine 36 units daily - A1c-  Essential hypertension -Losartan and clonidine on hold.  Slowly resume as appropriate.  As needed hydralazine.  ESRD on hemodialysis - Nephrology consulted  Chronic hypoxia on 3 L nasal cannula -Supplemental oxygen    DVT prophylaxis: Subcu heparin Code Status: Full code Family Communication: None  Status is: Inpatient  Remains inpatient appropriate because:Inpatient level of care appropriate due to severity of illness  Dispo: The patient is from: Home              Anticipated d/c is to: Home              Patient currently is not medically stable to d/c.  Ongoing evaluation for PVD, complicated wound.   Difficult to place patient No       Subjective: Feels okay, states she injured her toe about 2 months ago and since then she has been suffering from recurrent infection despite of routine wound care and oral antibiotic  therapy.  Review of Systems Otherwise negative except as per HPI, including: General: Denies fever, chills, night sweats or unintended weight loss. Resp: Denies cough, wheezing, shortness of breath. Cardiac: Denies chest pain, palpitations, orthopnea, paroxysmal nocturnal dyspnea. GI: Denies abdominal pain, nausea, vomiting, diarrhea or constipation GU: Denies dysuria, frequency, hesitancy or incontinence MS: Denies muscle aches, joint pain or swelling Neuro: Denies headache, neurologic deficits (focal weakness, numbness, tingling), abnormal gait Psych: Denies anxiety, depression, SI/HI/AVH Skin: Denies new rashes or lesions ID: Denies sick contacts, exotic exposures, travel  Examination:  General exam: Appears calm and comfortable  Respiratory system: Clear to auscultation. Respiratory effort normal. Cardiovascular system: S1 & S2 heard, RRR. No JVD, murmurs, rubs, gallops or clicks. No pedal edema. Gastrointestinal system: Abdomen is nondistended, soft and nontender. No organomegaly or masses felt. Normal bowel sounds heard. Central nervous system: Alert and oriented. No focal neurological deficits. Extremities: Symmetric 5 x 5 power. Skin: Right lower extremity fifth toe infection noted with mild drainage. Psychiatry: Judgement and insight appear normal. Mood & affect appropriate.     Objective: Vitals:   10/16/20 0000 10/16/20 0200 10/16/20 0400 10/16/20 0600  BP: 122/65 (!) 105/58 (!) 116/57 111/72  Pulse: (!) 126 (!) 124 98 (!) 102  Resp: (!) 25 (!) 24 (!) 23 16  Temp:   99.4 F (37.4 C)   TempSrc:   Oral   SpO2: 95% 94% 97% 98%  Weight:      Height:        Intake/Output Summary (Last 24  hours) at 10/16/2020 0838 Last data filed at 10/16/2020 0443 Gross per 24 hour  Intake 1690 ml  Output --  Net 1690 ml   Filed Weights   10/15/20 1905  Weight: 126.1 kg     Data Reviewed:   CBC: Recent Labs  Lab 10/15/20 1958  WBC 22.1*  NEUTROABS 19.0*  HGB 9.8*   HCT 30.6*  MCV 96.5  PLT 979   Basic Metabolic Panel: Recent Labs  Lab 10/15/20 1958  NA 130*  K 4.7  CL 92*  CO2 23  GLUCOSE 241*  BUN 52*  CREATININE 8.88*  CALCIUM 9.3   GFR: Estimated Creatinine Clearance: 10.2 mL/min (A) (by C-G formula based on SCr of 8.88 mg/dL (H)). Liver Function Tests: Recent Labs  Lab 10/15/20 1958  AST 64*  ALT 59*  ALKPHOS 559*  BILITOT 0.7  PROT 8.3*  ALBUMIN 3.3*   No results for input(s): LIPASE, AMYLASE in the last 168 hours. No results for input(s): AMMONIA in the last 168 hours. Coagulation Profile: Recent Labs  Lab 10/15/20 1958  INR 1.2   Cardiac Enzymes: No results for input(s): CKTOTAL, CKMB, CKMBINDEX, TROPONINI in the last 168 hours. BNP (last 3 results) No results for input(s): PROBNP in the last 8760 hours. HbA1C: Recent Labs    10/15/20 0033  HGBA1C 8.9*   CBG: No results for input(s): GLUCAP in the last 168 hours. Lipid Profile: No results for input(s): CHOL, HDL, LDLCALC, TRIG, CHOLHDL, LDLDIRECT in the last 72 hours. Thyroid Function Tests: No results for input(s): TSH, T4TOTAL, FREET4, T3FREE, THYROIDAB in the last 72 hours. Anemia Panel: No results for input(s): VITAMINB12, FOLATE, FERRITIN, TIBC, IRON, RETICCTPCT in the last 72 hours. Sepsis Labs: Recent Labs  Lab 10/15/20 1958 10/16/20 0500  PROCALCITON  --  3.26  LATICACIDVEN 1.8  --     Recent Results (from the past 240 hour(s))  Blood Culture (routine x 2)     Status: None (Preliminary result)   Collection Time: 10/15/20  8:03 PM   Specimen: BLOOD  Result Value Ref Range Status   Specimen Description BLOOD BLOOD RIGHT FOREARM  Final   Special Requests   Final    BOTTLES DRAWN AEROBIC AND ANAEROBIC Blood Culture adequate volume   Culture   Final    NO GROWTH < 12 HOURS Performed at [facility], [address], [place], [place] [address]    Report Status PENDING  Incomplete  Resp Panel by RT-PCR (Flu A&B, Covid)  Nasopharyngeal Swab     Status: None   Collection Time: 10/15/20  8:26 PM   Specimen: Nasopharyngeal Swab; Nasopharyngeal(NP) swabs in vial transport medium  Result Value Ref Range Status   SARS Coronavirus 2 by RT PCR NEGATIVE NEGATIVE Final    Comment: (NOTE) SARS-CoV-2 target nucleic acids are NOT DETECTED.  The SARS-CoV-2 RNA is generally detectable in upper respiratory specimens during the acute phase of infection. The lowest concentration of SARS-CoV-2 viral copies this assay can detect is 138 copies/mL. A negative result does not preclude SARS-Cov-2 infection and should not be used as the sole basis for treatment or other patient management decisions. A negative result may occur with  improper specimen collection/handling, submission of specimen other than nasopharyngeal swab, presence of viral mutation(s) within the areas targeted by this assay, and inadequate number of viral copies(<138 copies/mL). A negative result must be combined with clinical observations, patient history, and epidemiological information. The expected result is Negative.  Fact Sheet for Patients:  EntrepreneurPulse.com.au  Fact Sheet for Healthcare Providers:  IncredibleEmployment.be  This test is no t yet approved or cleared by the [place] FDA and  has been authorized for detection and/or diagnosis of SARS-CoV-2 by FDA under an Emergency Use Authorization (EUA). This EUA will remain  in effect (meaning this test can be used) for the duration of the COVID-19 declaration under Section 564(b)(1) of the Act, 21 U.S.C.section 360bbb-3(b)(1), unless the authorization is terminated  or revoked sooner.       Influenza A by PCR NEGATIVE NEGATIVE Final   Influenza B by PCR NEGATIVE NEGATIVE Final    Comment: (NOTE) The Xpert Xpress SARS-CoV-2/FLU/RSV plus assay is intended as an aid in the diagnosis of influenza from Nasopharyngeal swab specimens and should not be  used as a sole basis for treatment. Nasal washings and aspirates are unacceptable for Xpert Xpress SARS-CoV-2/FLU/RSV testing.  Fact Sheet for Patients: EntrepreneurPulse.com.au  Fact Sheet for Healthcare Providers: IncredibleEmployment.be  This test is not yet approved or cleared by the [place] FDA and has been authorized for detection and/or diagnosis of SARS-CoV-2 by FDA under an Emergency Use Authorization (EUA). This EUA will remain in effect (meaning this test can be used) for the duration of the COVID-19 declaration under Section 564(b)(1) of the Act, 21 U.S.C. section 360bbb-3(b)(1), unless the authorization is terminated or revoked.  Performed at [facility], [address] [place], [place] [address]   Blood Culture (routine x 2)     Status: None (Preliminary result)   Collection Time: 10/15/20  8:26 PM   Specimen: BLOOD  Result Value Ref Range Status   Specimen Description BLOOD RIGHT ANTECUBITAL  Final   Special Requests   Final    BOTTLES DRAWN AEROBIC AND ANAEROBIC Blood Culture adequate volume   Culture   Final    NO GROWTH < 12 HOURS Performed at [facility], [address] [place], [place] [address]    Report Status PENDING  Incomplete         Radiology Studies: DG Chest 2 View  Result Date: 10/15/2020 CLINICAL DATA:  Chest pain. EXAM: CHEST - 2 VIEW COMPARISON:  Chest radiograph dated 02/12/2018. FINDINGS: No focal consolidation, pleural effusion or pneumothorax. The cardiac silhouette is within normal limits. No acute osseous pathology. IMPRESSION: No active cardiopulmonary disease. Electronically Signed   By: Anner Crete M.D.   On: 10/15/2020 19:48   DG Foot 2 Views Right  Result Date: 10/15/2020 CLINICAL DATA:  Fifth toe infection.  Evaluate for osteomyelitis. EXAM: RIGHT FOOT - 2 VIEW COMPARISON:  None. FINDINGS: There is no acute fracture or dislocation. No bone erosion or  periosteal elevation to suggest acute osteomyelitis. The bones are osteopenic. Mild diffuse subcutaneous edema. Extensive vascular calcification of the foot. No radiopaque foreign object or soft tissue gas. IMPRESSION: 1. No acute fracture or dislocation. No radiographic evidence of acute osteomyelitis. 2. Osteopenia. Electronically Signed   By: Anner Crete M.D.   On: 10/15/2020 20:12        Scheduled Meds:  aspirin EC  81 mg Oral Daily   atorvastatin  80 mg Oral q1800   Cholecalciferol  1 capsule Oral Weekly   docusate sodium  100 mg Oral Daily   ferric citrate  210 mg Oral TID WC   heparin  5,000 Units Subcutaneous Q8H   insulin aspart  0-5 Units Subcutaneous QHS   insulin glargine-yfgn  36 Units Subcutaneous QHS   mometasone-formoterol  2 puff Inhalation BID   mometasone-formoterol  2 puff Inhalation BID   sodium chloride flush  3 mL Intravenous Q12H   Continuous Infusions:  sodium chloride     cefTRIAXone (ROCEPHIN)  IV     lactated ringers Stopped (10/16/20 0439)   metronidazole Stopped (10/16/20 0443)     LOS: 1 day   Time spent= 35 mins    Shunna Mikaelian Arsenio Loader, MD [facility]  If 7PM-7AM, please contact night-coverage  10/16/2020, 8:38 AM

## 2020-10-16 NOTE — Progress Notes (Signed)
[facility] Daily Progress Note   Patient seen and examined. Recommend RLE angiogram with possible intervention. Plan for Wed with [place]. Full consult to follow.  Discussed with Eliot Ford PA-C 10/16/2020 3:27 PM

## 2020-10-16 NOTE — Treatment Plan (Signed)
Came to evaluate the patient but she is in the vascular lab.  I spoke briefly with her husband.  We will see her again this evening  Lanae Crumbly, DPM 10/16/2020

## 2020-10-16 NOTE — Progress Notes (Signed)
[facility]  ROUNDING NOTE   Subjective:   [name] was admitted to [facility] on 10/15/2020 for Gangrene [facility]) [I96] Diabetic foot ulcer ([place]) [E11.621, L97.509] Decreased pedal pulses [R09.89] Sepsis, due to unspecified organism, unspecified whether acute organ dysfunction present [facility]) [A41.9]  Last hemodialysis treatment was Saturday, 8/20. She was having hypotension on her treatment and she took midodrine to complete her treatment.   Husband at bedside.   Patient has been following with wound care about her diabetic foot ulcer. She endorses this being there for 2 months. But now with pain and foul smelling drainage from wound.    Objective:  Vital signs in last 24 hours:  Temp:  [98.9 F (37.2 C)-100.3 F (37.9 C)] 98.9 F (37.2 C) (08/22 1015) Pulse Rate:  [97-139] 116 (08/22 1015) Resp:  [15-28] 15 (08/22 1015) BP: (105-135)/(54-91) 124/54 (08/22 1015) SpO2:  [91 %-100 %] 94 % (08/22 0900) Weight:  [126.1 kg] 126.1 kg (08/21 1905)  Weight change:  Filed Weights   10/15/20 1905  Weight: 126.1 kg    Intake/Output: I/O last 3 completed shifts: In: 1690 [I.V.:1000; IV Piggyback:690] Out: -    Intake/Output this shift:  No intake/output data recorded.  Physical Exam: General: NAD, sitting up in bed  Head: Normocephalic, atraumatic. Moist oral mucosal membranes  Eyes: Anicteric, PERRL  Neck: Supple, trachea midline  Lungs:  Clear to auscultation  Heart: Regular rate and rhythm  Abdomen:  Soft, nontender,   Extremities:  trace peripheral edema. Right foot with ulcer under fifth toe  Neurologic: Nonfocal, moving all four extremities  Skin: No lesions  Access: Left AVF    Basic Metabolic Panel: Recent Labs  Lab 10/15/20 1958  NA 130*  K 4.7  CL 92*  CO2 23  GLUCOSE 241*  BUN 52*  CREATININE 8.88*  CALCIUM 9.3    Liver Function Tests: Recent Labs  Lab 10/15/20 1958  AST 64*  ALT 59*  ALKPHOS 559*  BILITOT 0.7  PROT 8.3*   ALBUMIN 3.3*   No results for input(s): LIPASE, AMYLASE in the last 168 hours. No results for input(s): AMMONIA in the last 168 hours.  CBC: Recent Labs  Lab 10/15/20 1958  WBC 22.1*  NEUTROABS 19.0*  HGB 9.8*  HCT 30.6*  MCV 96.5  PLT 358    Cardiac Enzymes: No results for input(s): CKTOTAL, CKMB, CKMBINDEX, TROPONINI in the last 168 hours.  BNP: Invalid input(s): POCBNP  CBG: No results for input(s): GLUCAP in the last 168 hours.  Microbiology: Results for orders placed or performed during the hospital encounter of 10/15/20  Blood Culture (routine x 2)     Status: None (Preliminary result)   Collection Time: 10/15/20  8:03 PM   Specimen: BLOOD  Result Value Ref Range Status   Specimen Description BLOOD BLOOD RIGHT FOREARM  Final   Special Requests   Final    BOTTLES DRAWN AEROBIC AND ANAEROBIC Blood Culture adequate volume   Culture   Final    NO GROWTH < 12 HOURS Performed at [facility], [address], [place], [place] [address]    Report Status PENDING  Incomplete  Resp Panel by RT-PCR (Flu A&B, Covid) Nasopharyngeal Swab     Status: None   Collection Time: 10/15/20  8:26 PM   Specimen: Nasopharyngeal Swab; Nasopharyngeal(NP) swabs in vial transport medium  Result Value Ref Range Status   SARS Coronavirus 2 by RT PCR NEGATIVE NEGATIVE Final    Comment: (NOTE) SARS-CoV-2  target nucleic acids are NOT DETECTED.  The SARS-CoV-2 RNA is generally detectable in upper respiratory specimens during the acute phase of infection. The lowest concentration of SARS-CoV-2 viral copies this assay can detect is 138 copies/mL. A negative result does not preclude SARS-Cov-2 infection and should not be used as the sole basis for treatment or other patient management decisions. A negative result may occur with  improper specimen collection/handling, submission of specimen other than nasopharyngeal swab, presence of viral mutation(s) within the areas targeted by  this assay, and inadequate number of viral copies(<138 copies/mL). A negative result must be combined with clinical observations, patient history, and epidemiological information. The expected result is Negative.  Fact Sheet for Patients:  EntrepreneurPulse.com.au  Fact Sheet for Healthcare Providers:  IncredibleEmployment.be  This test is no t yet approved or cleared by the [place] FDA and  has been authorized for detection and/or diagnosis of SARS-CoV-2 by FDA under an Emergency Use Authorization (EUA). This EUA will remain  in effect (meaning this test can be used) for the duration of the COVID-19 declaration under Section 564(b)(1) of the Act, 21 U.S.C.section 360bbb-3(b)(1), unless the authorization is terminated  or revoked sooner.       Influenza A by PCR NEGATIVE NEGATIVE Final   Influenza B by PCR NEGATIVE NEGATIVE Final    Comment: (NOTE) The Xpert Xpress SARS-CoV-2/FLU/RSV plus assay is intended as an aid in the diagnosis of influenza from Nasopharyngeal swab specimens and should not be used as a sole basis for treatment. Nasal washings and aspirates are unacceptable for Xpert Xpress SARS-CoV-2/FLU/RSV testing.  Fact Sheet for Patients: EntrepreneurPulse.com.au  Fact Sheet for Healthcare Providers: IncredibleEmployment.be  This test is not yet approved or cleared by the [place] FDA and has been authorized for detection and/or diagnosis of SARS-CoV-2 by FDA under an Emergency Use Authorization (EUA). This EUA will remain in effect (meaning this test can be used) for the duration of the COVID-19 declaration under Section 564(b)(1) of the Act, 21 U.S.C. section 360bbb-3(b)(1), unless the authorization is terminated or revoked.  Performed at [facility], [address] [place], [place] [address]   Blood Culture (routine x 2)     Status: None (Preliminary result)    Collection Time: 10/15/20  8:26 PM   Specimen: BLOOD  Result Value Ref Range Status   Specimen Description BLOOD RIGHT ANTECUBITAL  Final   Special Requests   Final    BOTTLES DRAWN AEROBIC AND ANAEROBIC Blood Culture adequate volume   Culture   Final    NO GROWTH < 12 HOURS Performed at [facility], [address], [place], [place] [address]    Report Status PENDING  Incomplete    Coagulation Studies: Recent Labs    10/15/20 1958  LABPROT 14.9  INR 1.2    Urinalysis: No results for input(s): COLORURINE, LABSPEC, PHURINE, GLUCOSEU, HGBUR, BILIRUBINUR, KETONESUR, PROTEINUR, UROBILINOGEN, NITRITE, LEUKOCYTESUR in the last 72 hours.  Invalid input(s): APPERANCEUR    Imaging: DG Chest 2 View  Result Date: 10/15/2020 CLINICAL DATA:  Chest pain. EXAM: CHEST - 2 VIEW COMPARISON:  Chest radiograph dated 02/12/2018. FINDINGS: No focal consolidation, pleural effusion or pneumothorax. The cardiac silhouette is within normal limits. No acute osseous pathology. IMPRESSION: No active cardiopulmonary disease. Electronically Signed   By: Anner Crete M.D.   On: 10/15/2020 19:48   DG Foot 2 Views Right  Result Date: 10/15/2020 CLINICAL DATA:  Fifth toe infection.  Evaluate for osteomyelitis. EXAM: RIGHT FOOT - 2  VIEW COMPARISON:  None. FINDINGS: There is no acute fracture or dislocation. No bone erosion or periosteal elevation to suggest acute osteomyelitis. The bones are osteopenic. Mild diffuse subcutaneous edema. Extensive vascular calcification of the foot. No radiopaque foreign object or soft tissue gas. IMPRESSION: 1. No acute fracture or dislocation. No radiographic evidence of acute osteomyelitis. 2. Osteopenia. Electronically Signed   By: Anner Crete M.D.   On: 10/15/2020 20:12     Medications:    sodium chloride 250 mL (10/16/20 1048)   cefTRIAXone (ROCEPHIN)  IV     lactated ringers Stopped (10/16/20 0439)   metronidazole Stopped (10/16/20 0443)    aspirin EC   81 mg Oral Daily   atorvastatin  80 mg Oral q1800   cholecalciferol  4,000 Units Oral Weekly   docusate sodium  100 mg Oral Daily   ferric citrate  210 mg Oral TID WC   heparin  5,000 Units Subcutaneous Q8H   insulin aspart  0-5 Units Subcutaneous QHS   insulin glargine-yfgn  36 Units Subcutaneous QHS   mometasone-formoterol  2 puff Inhalation BID   sodium chloride flush  3 mL Intravenous Q12H   sodium chloride, acetaminophen, albuterol, albuterol, dextromethorphan-guaiFENesin, hydrALAZINE, ipratropium-albuterol, lactulose, metoprolol tartrate, ondansetron, oxyCODONE, polyethylene glycol, sodium chloride flush, traZODone  Assessment/ Plan:  [name] is a 50 y.o. black female with end stage renal disease on hemodialysis, diabetes mellitus type II, hypertension, congestive heart failure, COPD, who is admitted to [facility] on 10/15/2020 for Gangrene [facility]) [I96] Diabetic foot ulcer ([place]) [Y24.825, L97.509] Decreased pedal pulses [R09.89] Sepsis, due to unspecified organism, unspecified whether acute organ dysfunction present [facility]) [A41.9]  [facility] Left AVF 127.5kg  End Stage Renal Disease: last dialysis was Saturday. Next scheduled dialysis for tomorrow, Tuesday.   Hypotension: secondary to infection. Holding home regimen torsemide, losartan, clonidine.   Anemia with chronic kidney disease: hemoglobin 9.8. EPO with HD treatments.   Secondary Hyperparathyroidism with hyeprphosphatemia: there is risk for calciphylaxis. Outpatinet labs on 8/9: phos 10.6, calcium 8.2, PTH 1456.  - resume  cinacalcet and sevelamer with meals.   Diabetes mellitus type II with chronic kidney disease: insulin dependent. Complicated with diabetic foot ulcer. History of poor control. Hemoglobin A1c 9.2% on 09/19/2020.  - Appreciate podiatry and vascular input.  - empiric ceftriaxone and metronidazole.      LOS: 1 Lyriq J[place]ow 8/22/202211:27 AM

## 2020-10-16 NOTE — ED Notes (Signed)
Pt provided a patient phone.  

## 2020-10-16 NOTE — Progress Notes (Signed)
OT Cancellation Note  Patient Details Name: [name] MRN: [id number] DOB: [birth year]   Cancelled Treatment:    Reason Eval/Treat Not Completed: Patient at procedure or test/ unavailable. Order received, chart reviewed. As therapist entered room, pt being transferred for MRI. Pt requests therapist come back tomorrow for evaluation, as she is highly fatigued, hopes to sleep after returning from imaging. Will re-attempt evaluation as pt is available and willing to participate.  Josiah Lobo, PhD, MS, OTR/L 10/16/20, 2:32 PM

## 2020-10-16 NOTE — Consult Note (Signed)
Pharmacy Antibiotic Note  [name] is a 50 y.o. female w/ h/o ESRD on HD, DM II, DFU, HTN, Anemia of CKD, COPDadmitted on 10/15/2020 with sepsis, cellulitis, and Diabetic foot infection .  Pharmacy has been consulted for vancomycin dosing.  Plan: Vancomycin 2.5g x1 loading dose (1g at 2145;1.5g at 2316); then 1g post-HD on HD days. Will need to follow up once inpatient HD schedule determined.  F/u MRSA PCR and cultures. Ceftriaxone 1g q24h Per d/w MD: start  metronidazole 500mg  IV q8h  Height: 5' 4.5" (163.8 cm) Weight: 126.1 kg (278 lb) IBW/kg (Calculated) : 55.85  Temp (24hrs), Avg:100.3 F (37.9 C), Min:100.3 F (37.9 C), Max:100.3 F (37.9 C)  Recent Labs  Lab 10/15/20 1958  WBC 22.1*  CREATININE 8.88*  LATICACIDVEN 1.8    Estimated Creatinine Clearance: 10.2 mL/min (A) (by C-G formula based on SCr of 8.88 mg/dL (H)).    Allergies  Allergen Reactions   Dust Mite Extract Shortness Of Breath and Itching   Eggs Or Egg-Derived Products Itching    MAKES TONGUE ITCH but she will eat, only in small amounts   Mold Extract [Trichophyton] Shortness Of Breath and Itching   Pollen Extract Shortness Of Breath    Itching, shortness of breath, asthma like symptoms   Sulfa Antibiotics Itching   Feraheme [Ferumoxytol] Itching    Uses benadryl so she can get the medicine    Antimicrobials this admission: Ceftriaxone (8/21 >>  Vancomycin (8/21 >>   Dose adjustments this admission: ESRD-HD (inpatient schedule TBD)  Microbiology results: 8/21 BCx: pending 8/21 UCx: pending  8/22 MRSA PCR: ordered  Thank you for allowing pharmacy to be a part of this patient's care.  Shanon Brow Analei Whinery 10/16/2020 2:28 AM

## 2020-10-17 ENCOUNTER — Other Ambulatory Visit (INDEPENDENT_AMBULATORY_CARE_PROVIDER_SITE_OTHER): Payer: Self-pay | Admitting: Vascular Surgery

## 2020-10-17 ENCOUNTER — Encounter: Payer: Self-pay | Admitting: Cardiovascular Disease

## 2020-10-17 DIAGNOSIS — I96 Gangrene, not elsewhere classified: Secondary | ICD-10-CM

## 2020-10-17 DIAGNOSIS — J9611 Chronic respiratory failure with hypoxia: Secondary | ICD-10-CM | POA: Diagnosis not present

## 2020-10-17 DIAGNOSIS — E11628 Type 2 diabetes mellitus with other skin complications: Secondary | ICD-10-CM | POA: Diagnosis not present

## 2020-10-17 DIAGNOSIS — E11621 Type 2 diabetes mellitus with foot ulcer: Secondary | ICD-10-CM | POA: Diagnosis not present

## 2020-10-17 DIAGNOSIS — R0989 Other specified symptoms and signs involving the circulatory and respiratory systems: Secondary | ICD-10-CM | POA: Diagnosis not present

## 2020-10-17 DIAGNOSIS — E1122 Type 2 diabetes mellitus with diabetic chronic kidney disease: Secondary | ICD-10-CM | POA: Diagnosis not present

## 2020-10-17 DIAGNOSIS — L97509 Non-pressure chronic ulcer of other part of unspecified foot with unspecified severity: Secondary | ICD-10-CM

## 2020-10-17 DIAGNOSIS — L089 Local infection of the skin and subcutaneous tissue, unspecified: Secondary | ICD-10-CM

## 2020-10-17 DIAGNOSIS — A419 Sepsis, unspecified organism: Secondary | ICD-10-CM | POA: Diagnosis not present

## 2020-10-17 DIAGNOSIS — M86171 Other acute osteomyelitis, right ankle and foot: Secondary | ICD-10-CM

## 2020-10-17 DIAGNOSIS — N186 End stage renal disease: Secondary | ICD-10-CM | POA: Diagnosis not present

## 2020-10-17 LAB — CBC
HCT: 29.4 % — ABNORMAL LOW (ref 36.0–46.0)
Hemoglobin: 9.3 g/dL — ABNORMAL LOW (ref 12.0–15.0)
MCH: 30.1 pg (ref 26.0–34.0)
MCHC: 31.6 g/dL (ref 30.0–36.0)
MCV: 95.1 fL (ref 80.0–100.0)
Platelets: 314 10*3/uL (ref 150–400)
RBC: 3.09 MIL/uL — ABNORMAL LOW (ref 3.87–5.11)
RDW: 13.4 % (ref 11.5–15.5)
WBC: 20.8 10*3/uL — ABNORMAL HIGH (ref 4.0–10.5)
nRBC: 0 % (ref 0.0–0.2)

## 2020-10-17 LAB — PROCALCITONIN: Procalcitonin: 4.86 ng/mL

## 2020-10-17 LAB — BASIC METABOLIC PANEL
Anion gap: 16 — ABNORMAL HIGH (ref 5–15)
BUN: 76 mg/dL — ABNORMAL HIGH (ref 6–20)
CO2: 21 mmol/L — ABNORMAL LOW (ref 22–32)
Calcium: 9.4 mg/dL (ref 8.9–10.3)
Chloride: 91 mmol/L — ABNORMAL LOW (ref 98–111)
Creatinine, Ser: 10.68 mg/dL — ABNORMAL HIGH (ref 0.44–1.00)
GFR, Estimated: 4 mL/min — ABNORMAL LOW (ref >60)
Glucose, Bld: 194 mg/dL — ABNORMAL HIGH (ref 70–99)
Potassium: 5 mmol/L (ref 3.5–5.1)
Sodium: 128 mmol/L — ABNORMAL LOW (ref 135–145)

## 2020-10-17 LAB — GLUCOSE, CAPILLARY
Glucose-Capillary: 202 mg/dL — ABNORMAL HIGH (ref 70–99)
Glucose-Capillary: 134 mg/dL — ABNORMAL HIGH (ref 70–99)
Glucose-Capillary: 201 mg/dL — ABNORMAL HIGH (ref 70–99)
Glucose-Capillary: 168 mg/dL — ABNORMAL HIGH (ref 70–99)

## 2020-10-17 LAB — MAGNESIUM: Magnesium: 2 mg/dL (ref 1.7–2.4)

## 2020-10-17 MED ORDER — LIDOCAINE-PRILOCAINE 2.5-2.5 % EX CREA
1.0000 "application " | TOPICAL_CREAM | CUTANEOUS | Status: DC | PRN
  Filled 2020-10-17: qty 5

## 2020-10-17 MED ORDER — SODIUM CHLORIDE 0.9 % IV SOLN: 100.0000 mL | INTRAVENOUS | Status: DC | PRN

## 2020-10-17 MED ORDER — LIDOCAINE HCL (PF) 1 % IJ SOLN
5.0000 mL | INTRAMUSCULAR | Status: DC | PRN
  Filled 2020-10-17: qty 5

## 2020-10-17 MED ORDER — LINACLOTIDE 145 MCG PO CAPS
290.0000 ug | ORAL_CAPSULE | Freq: Every morning | ORAL | Status: DC
  Administered 2020-10-17 – 2020-11-08 (×12): 290 ug via ORAL
  Filled 2020-10-17 (×16): qty 2
  Filled 2020-10-17: qty 1
  Filled 2020-10-17 (×11): qty 2

## 2020-10-17 MED ORDER — PANTOPRAZOLE SODIUM 40 MG PO TBEC
40.0000 mg | DELAYED_RELEASE_TABLET | Freq: Every day | ORAL | Status: DC
  Administered 2020-10-17 – 2020-11-10 (×21): 40 mg via ORAL
  Filled 2020-10-17 (×22): qty 1

## 2020-10-17 MED ORDER — CINACALCET HCL 30 MG PO TABS
60.0000 mg | ORAL_TABLET | Freq: Every day | ORAL | Status: DC
  Administered 2020-10-17 – 2020-11-06 (×17): 60 mg via ORAL
  Filled 2020-10-17 (×22): qty 2

## 2020-10-17 MED ORDER — HEPARIN SODIUM (PORCINE) 1000 UNIT/ML DIALYSIS
1000.0000 [IU] | INTRAMUSCULAR | Status: DC | PRN
  Filled 2020-10-17: qty 1

## 2020-10-17 MED ORDER — PENTAFLUOROPROP-TETRAFLUOROETH EX AERO
1.0000 "application " | INHALATION_SPRAY | CUTANEOUS | Status: DC | PRN
  Filled 2020-10-17: qty 30

## 2020-10-17 MED ORDER — ALTEPLASE 2 MG IJ SOLR
2.0000 mg | Freq: Once | INTRAMUSCULAR | Status: DC | PRN
  Filled 2020-10-17: qty 2

## 2020-10-17 NOTE — Progress Notes (Signed)
PROGRESS NOTE    [name]  NLG:[id number] DOB: 22-Oct-1970 DOA: 10/15/2020 PCP: [name], NP   Brief Narrative:  50 year old with history of ESRD on HD, HTN, diabetes, chronic hypoxia on 3 L nasal cannula comes to the hospital for worsening foot infection, drainage from her diabetic foot ulcer.  MRI of the foot was positive for osteomyelitis, infectious disease consulted.  On IV antibiotics.  ABI showed concerns of moderate occlusive disease, vascular team was consulted and plans for lower extremity angiogram on 10/18/2020.   Assessment & Plan:   Principal Problem:   Diabetic foot ulcer ([place]) Active Problems:   Diabetes ([place])   Essential hypertension   DNR (do not resuscitate) discussion   End-stage renal disease on hemodialysis ([place])   Chronic respiratory failure with hypoxia and hypercapnia ([facility])   Decreased pedal pulses  Diabetic foot ulcer with right foot/toe osteomyelitis Decreased pedal pulses - Continue IV antibiotics, infectious disease consulted.  Vascular and podiatry following. - ABI-moderate occlusive disease bilateral lower extremity - Pain control, bowel regimen - Plans for angiogram tomorrow with intervention if necessary.  Diabetes mellitus type 2 - Insulin sliding scale and Accu-Chek.  Glargine 36 units daily - A1c-8.9  Essential hypertension -As needed hydralazine.  ESRD on hemodialysis - Nephrology following.  Chronic hypoxia on 3 L nasal cannula -Supplemental oxygen    DVT prophylaxis: Subcu heparin Code Status: Full code Family Communication: None  Status is: Inpatient  Remains inpatient appropriate because:Inpatient level of care appropriate due to severity of illness  Dispo: The patient is from: Home              Anticipated d/c is to: Home              Patient currently is not medically stable to d/c.  Ongoing evaluation for PVD, complicated wound.   Difficult to place patient No       Subjective: Seen at her  hemodialysis.  Patient states she still having a lot of discomfort in her right toe and mild drainage but overall no complaints.  Tolerating HD.  Review of Systems General: Denies fever, chills, night sweats or unintended weight loss. Resp: Denies cough, wheezing, shortness of breath. Cardiac: Denies chest pain, palpitations, orthopnea, paroxysmal nocturnal dyspnea. GI: Denies abdominal pain, nausea, vomiting, diarrhea or constipation GU: Denies dysuria, frequency, hesitancy or incontinence MS: Denies muscle aches, joint pain or swelling Neuro: Denies headache, neurologic deficits (focal weakness, numbness, tingling), abnormal gait Psych: Denies anxiety, depression, SI/HI/AVH Skin: Denies new rashes or lesions ID: Denies sick contacts, exotic exposures, travel Examination:  Constitutional: Not in acute distress Respiratory: Clear to auscultation bilaterally Cardiovascular: Normal sinus rhythm, no rubs Abdomen: Nontender nondistended good bowel sounds Musculoskeletal: No edema noted Skin: Right lower extremity fifth toe infection with mild drainage Neurologic: CN 2-12 grossly intact.  And nonfocal Psychiatric: Normal judgment and insight. Alert and oriented x 3. Normal mood.  Objective: Vitals:   10/17/20 1115 10/17/20 1130 10/17/20 1145 10/17/20 1200  BP: (!) 133/59 (!) 122/58 (!) 123/43 (!) 118/50  Pulse: (!) 103 (!) 104 (!) 105 (!) 106  Resp: (!) 21 20 18 19   Temp:      TempSrc:      SpO2:      Weight:      Height:        Intake/Output Summary (Last 24 hours) at 10/17/2020 1304 Last data filed at 10/17/2020 0301 Gross per 24 hour  Intake 400.23 ml  Output --  Net 400.23  ml   Filed Weights   10/15/20 1905  Weight: 126.1 kg     Data Reviewed:   CBC: Recent Labs  Lab 10/15/20 1958 10/16/20 1402 10/17/20 0352  WBC 22.1* 21.4* 20.8*  NEUTROABS 19.0*  --   --   HGB 9.8* 8.9* 9.3*  HCT 30.6* 28.5* 29.4*  MCV 96.5 97.6 95.1  PLT 358 325 664   Basic Metabolic  Panel: Recent Labs  Lab 10/15/20 1958 10/16/20 0347 10/16/20 1402 10/17/20 0352  NA 130* 132* 128* 128*  K 4.7 4.5 5.0 5.0  CL 92* 94* 90* 91*  CO2 23 24 19* 21*  GLUCOSE 241* 217* 295* 194*  BUN 52* 55* 60* 76*  CREATININE 8.88* 9.18* 9.75* 10.68*  CALCIUM 9.3 9.1 9.1 9.4  MG  --   --   --  2.0   GFR: Estimated Creatinine Clearance: 8.4 mL/min (A) (by C-G formula based on SCr of 10.68 mg/dL (H)). Liver Function Tests: Recent Labs  Lab 10/15/20 1958 10/16/20 0347  AST 64* 45*  ALT 59* 47*  ALKPHOS 559* 474*  BILITOT 0.7 0.9  PROT 8.3* 7.4  ALBUMIN 3.3* 2.7*   No results for input(s): LIPASE, AMYLASE in the last 168 hours. No results for input(s): AMMONIA in the last 168 hours. Coagulation Profile: Recent Labs  Lab 10/15/20 1958  INR 1.2   Cardiac Enzymes: No results for input(s): CKTOTAL, CKMB, CKMBINDEX, TROPONINI in the last 168 hours. BNP (last 3 results) No results for input(s): PROBNP in the last 8760 hours. HbA1C: Recent Labs    10/15/20 0033  HGBA1C 8.9*   CBG: Recent Labs  Lab 10/16/20 1650 10/16/20 2107 10/17/20 0843  GLUCAP 291* 252* 202*   Lipid Profile: No results for input(s): CHOL, HDL, LDLCALC, TRIG, CHOLHDL, LDLDIRECT in the last 72 hours. Thyroid Function Tests: No results for input(s): TSH, T4TOTAL, FREET4, T3FREE, THYROIDAB in the last 72 hours. Anemia Panel: No results for input(s): VITAMINB12, FOLATE, FERRITIN, TIBC, IRON, RETICCTPCT in the last 72 hours. Sepsis Labs: Recent Labs  Lab 10/15/20 1958 10/16/20 0500 10/17/20 0352  PROCALCITON  --  3.26 4.86  LATICACIDVEN 1.8  --   --     Recent Results (from the past 240 hour(s))  Blood Culture (routine x 2)     Status: None (Preliminary result)   Collection Time: 10/15/20  8:03 PM   Specimen: BLOOD  Result Value Ref Range Status   Specimen Description BLOOD BLOOD RIGHT FOREARM  Final   Special Requests   Final    BOTTLES DRAWN AEROBIC AND ANAEROBIC Blood Culture  adequate volume   Culture   Final    NO GROWTH 2 DAYS Performed at [facility], [address], [place], [place] [address]    Report Status PENDING  Incomplete  Resp Panel by RT-PCR (Flu A&B, Covid) Nasopharyngeal Swab     Status: None   Collection Time: 10/15/20  8:26 PM   Specimen: Nasopharyngeal Swab; Nasopharyngeal(NP) swabs in vial transport medium  Result Value Ref Range Status   SARS Coronavirus 2 by RT PCR NEGATIVE NEGATIVE Final    Comment: (NOTE) SARS-CoV-2 target nucleic acids are NOT DETECTED.  The SARS-CoV-2 RNA is generally detectable in upper respiratory specimens during the acute phase of infection. The lowest concentration of SARS-CoV-2 viral copies this assay can detect is 138 copies/mL. A negative result does not preclude SARS-Cov-2 infection and should not be used as the sole basis for treatment or other patient management decisions. A negative result  may occur with  improper specimen collection/handling, submission of specimen other than nasopharyngeal swab, presence of viral mutation(s) within the areas targeted by this assay, and inadequate number of viral copies(<138 copies/mL). A negative result must be combined with clinical observations, patient history, and epidemiological information. The expected result is Negative.  Fact Sheet for Patients:  EntrepreneurPulse.com.au  Fact Sheet for Healthcare Providers:  IncredibleEmployment.be  This test is no t yet approved or cleared by the [place] FDA and  has been authorized for detection and/or diagnosis of SARS-CoV-2 by FDA under an Emergency Use Authorization (EUA). This EUA will remain  in effect (meaning this test can be used) for the duration of the COVID-19 declaration under Section 564(b)(1) of the Act, 21 U.S.C.section 360bbb-3(b)(1), unless the authorization is terminated  or revoked sooner.       Influenza A by PCR NEGATIVE NEGATIVE Final    Influenza B by PCR NEGATIVE NEGATIVE Final    Comment: (NOTE) The Xpert Xpress SARS-CoV-2/FLU/RSV plus assay is intended as an aid in the diagnosis of influenza from Nasopharyngeal swab specimens and should not be used as a sole basis for treatment. Nasal washings and aspirates are unacceptable for Xpert Xpress SARS-CoV-2/FLU/RSV testing.  Fact Sheet for Patients: EntrepreneurPulse.com.au  Fact Sheet for Healthcare Providers: IncredibleEmployment.be  This test is not yet approved or cleared by the [place] FDA and has been authorized for detection and/or diagnosis of SARS-CoV-2 by FDA under an Emergency Use Authorization (EUA). This EUA will remain in effect (meaning this test can be used) for the duration of the COVID-19 declaration under Section 564(b)(1) of the Act, 21 U.S.C. section 360bbb-3(b)(1), unless the authorization is terminated or revoked.  Performed at [facility], [address] [place], [place] [address]   Blood Culture (routine x 2)     Status: None (Preliminary result)   Collection Time: 10/15/20  8:26 PM   Specimen: BLOOD  Result Value Ref Range Status   Specimen Description BLOOD RIGHT ANTECUBITAL  Final   Special Requests   Final    BOTTLES DRAWN AEROBIC AND ANAEROBIC Blood Culture adequate volume   Culture   Final    NO GROWTH 2 DAYS Performed at [facility], [address], [place], [place] [address]    Report Status PENDING  Incomplete         Radiology Studies: DG Chest 2 View  Result Date: 10/15/2020 CLINICAL DATA:  Chest pain. EXAM: CHEST - 2 VIEW COMPARISON:  Chest radiograph dated 02/12/2018. FINDINGS: No focal consolidation, pleural effusion or pneumothorax. The cardiac silhouette is within normal limits. No acute osseous pathology. IMPRESSION: No active cardiopulmonary disease. Electronically Signed   By: Anner Crete M.D.   On: 10/15/2020 19:48   MR FOOT RIGHT WO  CONTRAST  Result Date: 10/17/2020 CLINICAL DATA:  History of diabetes and end-stage renal disease. Soft tissue wound of the lateral forefoot. EXAM: MRI OF THE RIGHT FOREFOOT WITHOUT CONTRAST TECHNIQUE: Multiplanar, multisequence MR imaging of the right foot was performed. No intravenous contrast was administered. COMPARISON:  None. FINDINGS: Bones/Joint/Cartilage Soft tissue wound of the fifth digit along the dorsal aspect. Bone marrow edema in the fifth metatarsal head and fifth proximal phalanx concerning for osteomyelitis. No acute fracture or dislocation. Normal alignment. No joint effusion. Mild osteoarthritis of the first MTP joint. Ligaments Collateral ligaments are intact. Lisfranc ligament is intact. Muscles and Tendons Flexor, peroneal and extensor compartment tendons are intact. There are small foci of low signal within the plantar musculature  concerning for soft tissue air as can be seen with necrotizing infection. Mild edema within the plantar musculature. Soft tissue No fluid collection or hematoma. No soft tissue mass. Soft tissue edema throughout the dorsal aspect of the foot which may be reactive versus secondary to cellulitis. IMPRESSION: 1. Soft tissue wound of the fifth digit along the dorsal aspect. Bone marrow edema in the fifth metatarsal head and fifth proximal phalanx concerning for osteomyelitis. 2. There are small foci of low signal within the plantar musculature concerning for air as can be seen with necrotizing infection. 3. Soft tissue edema throughout the dorsal aspect of the foot which may be reactive versus secondary to cellulitis. Electronically Signed   By: Kathreen Devoid M.D.   On: 10/17/2020 06:23   US ARTERIAL ABI (SCREENING LOWER EXTREMITY)  Result Date: 10/16/2020 CLINICAL DATA:  Bilateral lower extremity pain and claudication. Right lower extremity gangrene. Diabetes, right vascular ulcers, hypertension, hyperlipidemia. EXAM: NONINVASIVE PHYSIOLOGIC VASCULAR STUDY OF  BILATERAL LOWER EXTREMITIES TECHNIQUE: Evaluation of both lower extremities were performed at rest, including calculation of ankle-brachial indices with single level Doppler, pressure and pulse volume recording. COMPARISON:  None. FINDINGS: Right ABI:  0.68 Left ABI:  0.59 Right Lower Extremity: Dorsalis pedis was noncompressible. Monophasic arterial waveforms at the ankle. Left Lower Extremity: Dorsalis pedis noncompressible. Monophasic waveforms at the ankle. IMPRESSION: Moderate bilateral lower extremity arterial occlusive disease at rest. Electronically Signed   By: Lucrezia Europe M.D.   On: 10/16/2020 12:29   DG Foot 2 Views Right  Result Date: 10/15/2020 CLINICAL DATA:  Fifth toe infection.  Evaluate for osteomyelitis. EXAM: RIGHT FOOT - 2 VIEW COMPARISON:  None. FINDINGS: There is no acute fracture or dislocation. No bone erosion or periosteal elevation to suggest acute osteomyelitis. The bones are osteopenic. Mild diffuse subcutaneous edema. Extensive vascular calcification of the foot. No radiopaque foreign object or soft tissue gas. IMPRESSION: 1. No acute fracture or dislocation. No radiographic evidence of acute osteomyelitis. 2. Osteopenia. Electronically Signed   By: Anner Crete M.D.   On: 10/15/2020 20:12        Scheduled Meds:  aspirin EC  81 mg Oral Daily   atorvastatin  80 mg Oral q1800   Chlorhexidine Gluconate Cloth  6 each Topical Daily   cholecalciferol  4,000 Units Oral Weekly   cinacalcet  60 mg Oral Daily   docusate sodium  100 mg Oral Daily   ferric citrate  210 mg Oral TID WC   heparin  5,000 Units Subcutaneous Q8H   insulin aspart  0-5 Units Subcutaneous QHS   insulin aspart  0-9 Units Subcutaneous TID WC   insulin aspart  2 Units Subcutaneous TID WC   insulin glargine-yfgn  36 Units Subcutaneous QHS   linaclotide  290 mcg Oral q morning   mometasone-formoterol  2 puff Inhalation BID   pantoprazole  40 mg Oral Daily   sodium chloride flush  3 mL Intravenous Q12H    Continuous Infusions:  sodium chloride Stopped (10/16/20 1111)   sodium chloride Stopped (10/16/20 1804)   sodium chloride     sodium chloride     ceFEPime (MAXIPIME) IV Stopped (10/16/20 1625)   vancomycin       LOS: 2 days   Time spent= 35 mins    Dhamar Gregory Arsenio Loader, MD [facility]  If 7PM-7AM, please contact night-coverage  10/17/2020, 1:04 PM

## 2020-10-17 NOTE — Plan of Care (Signed)

## 2020-10-17 NOTE — Consult Note (Signed)
[name]  MRN : [id number]  [name] is a 50 y.o. (Mar 13, 1970) female who presents with chief complaint of  Chief Complaint  Patient presents with   Chest Pain   History of Present Illness:  [name] is a 50 y.o. female seen in ed with complaints of generalized malaise.   Pt noted that her toe has been black and malodorous.Pt is being followed at wound care for her Diabetic foot wound and per patient it has been draining.  Pt has right foot diabetic foot wound since July and ulcer since 4 days.   At presentation, the patient noted to be dizzy, and her bp at dialysis was 88 and took midodrine and got her HD treatment was completed.    Heart was beating was fast, back and legs were hurting bad, ulcer was draining, and was tender, and cant walk on it. Pt has wound care appt tomorrow. Bgt are usually fair and 250's-70 sometimes.    Pt reports chills x 2 days and fever today that she did not know about.  Pt also reports vomiting x 1 month and xray of stomach by pcp and was told she was constipated.    Pt has past medical history of ESRD on HD, DM II,DFU, HTN, Anemia of CKD, COPD.  Vascular surgery was consulted by Dr Posey Pronto for a chronic nonhealing wound on the right foot in the setting of multiple risk factors for atherosclerotic disease.  Current Facility-Administered Medications  Medication Dose Route Frequency Provider Last Rate Last Admin   0.9 %  sodium chloride infusion  250 mL Intravenous PRN Para Skeans, MD   Stopped at 10/16/20 1111   0.9 %  sodium chloride infusion   Intravenous Continuous Dew, Erskine Squibb, MD   Stopped at 10/16/20 1804   0.9 %  sodium chloride infusion  100 mL Intravenous PRN Breeze, Benancio Deeds, NP       0.9 %  sodium chloride infusion  100 mL Intravenous PRN Colon Flattery, NP       acetaminophen (TYLENOL) tablet 1,000 mg  1,000 mg Oral Q6H PRN Para Skeans, MD   1,000 mg at 10/16/20 2038    albuterol (PROVENTIL) (2.5 MG/3ML) 0.083% nebulizer solution 2.5 mg  2.5 mg Nebulization Q6H PRN Para Skeans, MD   2.5 mg at 10/16/20 0522   albuterol (VENTOLIN HFA) 108 (90 Base) MCG/ACT inhaler 2 puff  2 puff Inhalation Q4H PRN Para Skeans, MD       alteplase (CATHFLO ACTIVASE) injection 2 mg  2 mg Intracatheter Once PRN Colon Flattery, NP       alum & mag hydroxide-simeth (MAALOX/MYLANTA) 200-200-20 MG/5ML suspension 30 mL  30 mL Oral Q4H PRN Amin, Ankit Chirag, MD   30 mL at 10/16/20 1622   aspirin EC tablet 81 mg  81 mg Oral Daily Para Skeans, MD   81 mg at 10/17/20 0917   atorvastatin (LIPITOR) tablet 80 mg  80 mg Oral q1800 Para Skeans, MD   80 mg at 10/16/20 1707   calcium carbonate (TUMS - dosed in mg elemental calcium) chewable tablet 200 mg of elemental calcium  1 tablet Oral TID PRN Damita Lack, MD   200 mg of elemental calcium at 10/16/20 1622   ceFEPIme (MAXIPIME) 1 g in sodium chloride 0.9 % 100 mL IVPB  1 g Intravenous Q24H Rito Ehrlich A, RPH   Stopped at 10/16/20 1625   Chlorhexidine  Gluconate Cloth 2 % PADS 6 each  6 each Topical Daily Amin, Ankit Chirag, MD       cholecalciferol (VITAMIN D) tablet 4,000 Units  4,000 Units Oral Weekly Para Skeans, MD       cinacalcet (SENSIPAR) tablet 60 mg  60 mg Oral Daily Amin, Ankit Chirag, MD       dextromethorphan-guaiFENesin (Hollister DM) 30-600 MG per 12 hr tablet 1 tablet  1 tablet Oral BID PRN Damita Lack, MD   1 tablet at 10/16/20 1049   docusate sodium (COLACE) capsule 100 mg  100 mg Oral Daily Para Skeans, MD   100 mg at 10/17/20 1610   ferric citrate (AURYXIA) tablet 210 mg  210 mg Oral TID WC Para Skeans, MD   210 mg at 10/17/20 0917   heparin injection 1,000 Units  1,000 Units Dialysis PRN Colon Flattery, NP       heparin injection 5,000 Units  5,000 Units Subcutaneous Q8H Para Skeans, MD   5,000 Units at 10/17/20 0600   hydrALAZINE (APRESOLINE) injection 10 mg  10 mg Intravenous Q4H PRN Amin,  Ankit Chirag, MD       insulin aspart (novoLOG) injection 0-5 Units  0-5 Units Subcutaneous QHS Amin, Ankit Chirag, MD       insulin aspart (novoLOG) injection 0-9 Units  0-9 Units Subcutaneous TID WC Amin, Ankit Chirag, MD   3 Units at 10/17/20 0800   insulin aspart (novoLOG) injection 2 Units  2 Units Subcutaneous TID WC Amin, Ankit Chirag, MD   2 Units at 10/17/20 0800   insulin glargine-yfgn (SEMGLEE) injection 36 Units  36 Units Subcutaneous QHS Lorna Dibble, RPH   36 Units at 10/16/20 2109   ipratropium-albuterol (DUONEB) 0.5-2.5 (3) MG/3ML nebulizer solution 3 mL  3 mL Nebulization Q6H PRN Para Skeans, MD   3 mL at 10/17/20 0424   lactulose (CHRONULAC) 10 GM/15ML solution 20 g  20 g Oral Daily PRN Para Skeans, MD   20 g at 10/16/20 1049   lidocaine (PF) (XYLOCAINE) 1 % injection 5 mL  5 mL Intradermal PRN Colon Flattery, NP       lidocaine-prilocaine (EMLA) cream 1 application  1 application Topical PRN Colon Flattery, NP       linaclotide (LINZESS) capsule 290 mcg  290 mcg Oral q morning Amin, Ankit Chirag, MD       metoprolol tartrate (LOPRESSOR) injection 5 mg  5 mg Intravenous Q4H PRN Amin, Ankit Chirag, MD   5 mg at 10/16/20 1050   mometasone-formoterol (DULERA) 200-5 MCG/ACT inhaler 2 puff  2 puff Inhalation BID Para Skeans, MD   2 puff at 10/17/20 0830   ondansetron (ZOFRAN-ODT) disintegrating tablet 4 mg  4 mg Oral Q8H PRN Para Skeans, MD       oxyCODONE (Oxy IR/ROXICODONE) immediate release tablet 5 mg  5 mg Oral Q4H PRN Amin, Ankit Chirag, MD   5 mg at 10/17/20 0502   pantoprazole (PROTONIX) EC tablet 40 mg  40 mg Oral Daily Amin, Ankit Chirag, MD   40 mg at 10/17/20 9604   pentafluoroprop-tetrafluoroeth (GEBAUERS) aerosol 1 application  1 application Topical PRN Colon Flattery, NP       polyethylene glycol (MIRALAX / GLYCOLAX) packet 17 g  17 g Oral Daily PRN Para Skeans, MD       sodium chloride flush (NS) 0.9 % injection 3 mL  3 mL Intravenous Q12H Para Skeans, MD  3 mL at 10/17/20 8182   sodium chloride flush (NS) 0.9 % injection 3 mL  3 mL Intravenous PRN Para Skeans, MD       traZODone (DESYREL) tablet 50 mg  50 mg Oral QHS PRN Amin, Ankit Chirag, MD       vancomycin (VANCOCIN) IVPB 1000 mg/200 mL premix  1,000 mg Intravenous Q T,Th,Sa-HD Pearla Dubonnet, RPH       Past Medical History:  Diagnosis Date   (HFpEF) heart failure with preserved ejection fraction ([place])    a. 11/2014 Echo: EF 50-55%, Gr1 DD; b. 06/2016 Echo: EF 60-65%, no rwma, mod dil LA, nl RV, triv eff.   Anxiety    Asthma    Bronchitis    CHF (congestive heart failure) (HCC)    Chronic cough    Chronic respiratory failure with hypoxia and hypercapnia ([place])    a. Home O2.  (2-4L)   CKD (chronic kidney disease), stage III (HCC)    Depression    Diabetes mellitus without complication (HCC)    Diverticulosis    Dyspnea    Dysrhythmia    per pt, no arrythmias   Environmental and seasonal allergies    Extreme obesity with alveolar hypoventilation (HCC)    Hypertension    Iron deficiency anemia    a. chronic blood loss->heavy menses.   Kidney failure    dialysis tues/thur/sat   ... left arm shunt   Orthopnea    Oxygen dependent    3l/min   Pericardial effusion    a. 11/2014 CT Chest: Mod effusion; b. 11/2014 Echo: small to mod circumferential effusion. No hemodynamic compromise.   Pneumonia    history   Poorly controlled diabetes mellitus ([place])    a. 11/2014 A1c 13.2.   Swelling of both lower extremities    Past Surgical History:  Procedure Laterality Date   A/V FISTULAGRAM Left 06/09/2017   Procedure: A/V FISTULAGRAM;  Surgeon: Algernon Huxley, MD;  Location: [facility];  Service: Cardiovascular;  Laterality: Left;   A/V FISTULAGRAM Left 06/16/2017   Procedure: A/V FISTULAGRAM;  Surgeon: Algernon Huxley, MD;  Location: [facility];  Service: Cardiovascular;  Laterality: Left;   A/V FISTULAGRAM Left 01/28/2018   Procedure: A/V FISTULAGRAM;   Surgeon: Algernon Huxley, MD;  Location: [facility];  Service: Cardiovascular;  Laterality: Left;   AV FISTULA PLACEMENT Left 04/25/2017   Procedure: ARTERIOVENOUS (AV) FISTULA CREATION;  Surgeon: Algernon Huxley, MD;  Location: [facility];  Service: Vascular;  Laterality: Left;   CATARACT EXTRACTION W/PHACO Left 01/21/2018   Procedure: CATARACT EXTRACTION PHACO AND INTRAOCULAR LENS PLACEMENT (IOC);  Surgeon: Leandrew Koyanagi, MD;  Location: [facility];  Service: Ophthalmology;  Laterality: Left;  [place] 01:55.5 CDE 9.05 EAUP 13.9 Fluid Pack Lot # [id number]   CESAREAN SECTION     times 3   DIALYSIS/PERMA CATHETER INSERTION Right 04/04/2017   Procedure: DIALYSIS/PERMA CATHETER INSERTION;  Surgeon: Katha Cabal, MD;  Location: [facility];  Service: Cardiovascular;  Laterality: Right;   DIALYSIS/PERMA CATHETER REMOVAL N/A 09/17/2017   Procedure: DIALYSIS/PERMA CATHETER REMOVAL;  Surgeon: Algernon Huxley, MD;  Location: [facility];  Service: Cardiovascular;  Laterality: N/A;   INSERTION OF DIALYSIS CATHETER N/A 04/16/2017   Procedure: INSERTION OF DIALYSIS PERM CATHETER;  Surgeon: Algernon Huxley, MD;  Location: [facility];  Service: Vascular;  Laterality: N/A;   Social History Social History   Tobacco Use   Smoking  status: Never   Smokeless tobacco: Never  Vaping Use   Vaping Use: Never used  Substance Use Topics   Alcohol use: Yes    Alcohol/week: 0.0 standard drinks    Comment: occasional   Drug use: Yes    Types: Marijuana   Family History Family History  Problem Relation Age of Onset   Diabetes Mother    Hypertension Mother    Hypertension Father    Diabetes Father    Hypertension Other    Diabetes Other    Breast cancer Maternal Aunt 37   Breast cancer Maternal Aunt 73  Patient denies any family history of peripheral artery disease, venous disease or renal disease.  Allergies  Allergen Reactions   Dust Mite Extract Shortness Of Breath and Itching   Eggs Or  Egg-Derived Products Itching    MAKES TONGUE ITCH but she will eat, only in small amounts   Mold Extract [Trichophyton] Shortness Of Breath and Itching   Pollen Extract Shortness Of Breath    Itching, shortness of breath, asthma like symptoms   Sulfa Antibiotics Itching   Feraheme [Ferumoxytol] Itching    Uses benadryl so she can get the medicine   REVIEW OF SYSTEMS (Negative unless checked)  Constitutional: [] Weight loss  [] Fever  [] Chills Cardiac: [] Chest pain   [] Chest pressure   [] Palpitations   [] Shortness of breath when laying flat   [] Shortness of breath at rest   [] Shortness of breath with exertion. Vascular:  [] Pain in legs with walking   [x] Pain in legs at rest   [] Pain in legs when laying flat   [] Claudication   [] Pain in feet when walking  [] Pain in feet at rest  [] Pain in feet when laying flat   [] History of DVT   [] Phlebitis   [x] Swelling in legs   [] Varicose veins   [] Non-healing ulcers Pulmonary:   [] Uses home oxygen   [] Productive cough   [] Hemoptysis   [] Wheeze  [] COPD   [] Asthma Neurologic:  [] Dizziness  [] Blackouts   [] Seizures   [] History of stroke   [] History of TIA  [] Aphasia   [] Temporary blindness   [] Dysphagia   [] Weakness or numbness in arms   [] Weakness or numbness in legs Musculoskeletal:  [] Arthritis   [] Joint swelling   [] Joint pain   [] Low back pain Hematologic:  [] Easy bruising  [] Easy bleeding   [] Hypercoagulable state   [] Anemic  [] Hepatitis Gastrointestinal:  [] Blood in stool   [] Vomiting blood  [] Gastroesophageal reflux/heartburn   [] Difficulty swallowing. Genitourinary:  [x] Chronic kidney disease   [] Difficult urination  [] Frequent urination  [] Burning with urination   [] Blood in urine Skin:  [] Rashes   [x] Ulcers   [x] Wounds Psychological:  [] History of anxiety   []  History of major depression.  Physical Examination  Vitals:   10/17/20 1115 10/17/20 1130 10/17/20 1145 10/17/20 1200  BP: (!) 133/59 (!) 122/58 (!) 123/43 (!) 118/50  Pulse: (!) 103 (!)  104 (!) 105 (!) 106  Resp: (!) 21 20 18 19   Temp:      TempSrc:      SpO2:      Weight:      Height:       Body mass index is 46.98 kg/m. Gen:  WD/WN, NAD Head: [place]/AT, No temporalis wasting. Prominent temp pulse not noted. Ear/Nose/Throat: Hearing grossly intact, nares w/o erythema or drainage, oropharynx w/o Erythema/Exudate Eyes: Sclera non-icteric, conjunctiva clear Neck: Trachea midline.  No JVD.  Pulmonary:  Good air movement, respirations not labored, equal bilaterally.  Cardiac:  RRR, normal S1, S2. Vascular:  Vessel Right Left  Radial Palpable Palpable  Ulnar Palpable Palpable  Brachial Palpable Palpable  Carotid Palpable, without bruit Palpable, without bruit  Aorta Not palpable N/A  Femoral Palpable Palpable  Popliteal Palpable Palpable  PT Non-Palpable Non-Palpable  DP Non-Palpable Non-Palpable   Right lower extremity: Thigh soft. Calf soft. Hard to palpate pedal pulses the extremity is warm however due to body habitus and edema hard to palpate.  Motor/sensory is intact.  Wound noted to the dorsal aspect of the fifth toe on the right foot.  Gastrointestinal: soft, non-tender/non-distended. No guarding/reflex.  Musculoskeletal: M/S 5/5 throughout.  Extremities without ischemic changes.  No deformity or atrophy. No edema. Neurologic: Sensation grossly intact in extremities.  Symmetrical.  Speech is fluent. Motor exam as listed above. Psychiatric: Judgment intact, Mood & affect appropriate for pt's clinical situation. Dermatologic: As above Lymph : No Cervical, Axillary, or Inguinal lymphadenopathy.  CBC Lab Results  Component Value Date   WBC 20.8 (H) 10/17/2020   HGB 9.3 (L) 10/17/2020   HCT 29.4 (L) 10/17/2020   MCV 95.1 10/17/2020   PLT 314 10/17/2020   BMET    Component Value Date/Time   NA 128 (L) 10/17/2020 0352   NA 135 (L) 11/21/2013 1153   K 5.0 10/17/2020 0352   K 4.1 11/21/2013 1153   CL 91 (L) 10/17/2020 0352   CL 98 11/21/2013 1153   CO2 21  (L) 10/17/2020 0352   CO2 30 11/21/2013 1153   GLUCOSE 194 (H) 10/17/2020 0352   GLUCOSE 287 (H) 11/21/2013 1153   BUN 76 (H) 10/17/2020 0352   BUN 16 11/21/2013 1153   CREATININE 10.68 (H) 10/17/2020 0352   CREATININE 0.85 11/21/2013 1153   CALCIUM 9.4 10/17/2020 0352   CALCIUM 8.7 11/21/2013 1153   GFRNONAA 4 (L) 10/17/2020 0352   GFRNONAA >60 11/21/2013 1153   GFRAA 15 (L) 04/26/2017 0609   GFRAA >60 11/21/2013 1153   Estimated Creatinine Clearance: 8.4 mL/min (A) (by C-G formula based on SCr of 10.68 mg/dL (H)).  COAG Lab Results  Component Value Date   INR 1.2 10/15/2020   Radiology DG Chest 2 View  Result Date: 10/15/2020 CLINICAL DATA:  Chest pain. EXAM: CHEST - 2 VIEW COMPARISON:  Chest radiograph dated 02/12/2018. FINDINGS: No focal consolidation, pleural effusion or pneumothorax. The cardiac silhouette is within normal limits. No acute osseous pathology. IMPRESSION: No active cardiopulmonary disease. Electronically Signed   By: Anner Crete M.D.   On: 10/15/2020 19:48   MR FOOT RIGHT WO CONTRAST  Result Date: 10/17/2020 CLINICAL DATA:  History of diabetes and end-stage renal disease. Soft tissue wound of the lateral forefoot. EXAM: MRI OF THE RIGHT FOREFOOT WITHOUT CONTRAST TECHNIQUE: Multiplanar, multisequence MR imaging of the right foot was performed. No intravenous contrast was administered. COMPARISON:  None. FINDINGS: Bones/Joint/Cartilage Soft tissue wound of the fifth digit along the dorsal aspect. Bone marrow edema in the fifth metatarsal head and fifth proximal phalanx concerning for osteomyelitis. No acute fracture or dislocation. Normal alignment. No joint effusion. Mild osteoarthritis of the first MTP joint. Ligaments Collateral ligaments are intact. Lisfranc ligament is intact. Muscles and Tendons Flexor, peroneal and extensor compartment tendons are intact. There are small foci of low signal within the plantar musculature concerning for soft tissue air as can  be seen with necrotizing infection. Mild edema within the plantar musculature. Soft tissue No fluid collection or hematoma. No soft tissue mass. Soft tissue edema throughout the dorsal aspect of  the foot which may be reactive versus secondary to cellulitis. IMPRESSION: 1. Soft tissue wound of the fifth digit along the dorsal aspect. Bone marrow edema in the fifth metatarsal head and fifth proximal phalanx concerning for osteomyelitis. 2. There are small foci of low signal within the plantar musculature concerning for air as can be seen with necrotizing infection. 3. Soft tissue edema throughout the dorsal aspect of the foot which may be reactive versus secondary to cellulitis. Electronically Signed   By: Kathreen Devoid M.D.   On: 10/17/2020 06:23   US ARTERIAL ABI (SCREENING LOWER EXTREMITY)  Result Date: 10/16/2020 CLINICAL DATA:  Bilateral lower extremity pain and claudication. Right lower extremity gangrene. Diabetes, right vascular ulcers, hypertension, hyperlipidemia. EXAM: NONINVASIVE PHYSIOLOGIC VASCULAR STUDY OF BILATERAL LOWER EXTREMITIES TECHNIQUE: Evaluation of both lower extremities were performed at rest, including calculation of ankle-brachial indices with single level Doppler, pressure and pulse volume recording. COMPARISON:  None. FINDINGS: Right ABI:  0.68 Left ABI:  0.59 Right Lower Extremity: Dorsalis pedis was noncompressible. Monophasic arterial waveforms at the ankle. Left Lower Extremity: Dorsalis pedis noncompressible. Monophasic waveforms at the ankle. IMPRESSION: Moderate bilateral lower extremity arterial occlusive disease at rest. Electronically Signed   By: Lucrezia Europe M.D.   On: 10/16/2020 12:29   DG Foot 2 Views Right  Result Date: 10/15/2020 CLINICAL DATA:  Fifth toe infection.  Evaluate for osteomyelitis. EXAM: RIGHT FOOT - 2 VIEW COMPARISON:  None. FINDINGS: There is no acute fracture or dislocation. No bone erosion or periosteal elevation to suggest acute osteomyelitis. The  bones are osteopenic. Mild diffuse subcutaneous edema. Extensive vascular calcification of the foot. No radiopaque foreign object or soft tissue gas. IMPRESSION: 1. No acute fracture or dislocation. No radiographic evidence of acute osteomyelitis. 2. Osteopenia. Electronically Signed   By: Anner Crete M.D.   On: 10/15/2020 20:12    Assessment/Plan The patient is a 50 year old female with multiple medical issues who presents with a chronic nonhealing wound of over a month to the dorsum of the right fifth toe  1.  Possible atherosclerotic disease with gangrene: 1 formation occurred approximately 1 month ago.  Patient notes progressively worsening nonhealing chronic wound to the fifth toe.  Patient was prompted to seek medical attention when this wound started to drain and smell.  Hard to palpate pedal pulses on exam due to body habitus and edema however in the setting of a chronic wound multiple risk factors for atherosclerotic disease recommend the patient undergo a right lower extremity angiogram possible intervention and attempt assess her anatomy and contributing degree of atherosclerotic disease.  If appropriate an attempt to revascularize extremity to be made at that time.  Procedure, risks and benefits were explained to the patient.  All questions were answered.  The patient wishes to proceed.  Plan on this tomorrow with Dr. Lucky Cowboy  2.  End-stage renal disease: Patient currently denies any issues with her existing dialysis access.  3.  Chronic oxygen: Patient is on 3 L at baseline Try to use minimal amounts of sedation as possible  4.  Diabetes: Uncontrolled. Encouraged good control as its slows the progression of atherosclerotic disease   Discussed with Dr. Mayme Genta, PA-C 10/17/2020 1:47 PM  This [name] was created with Dragon medical transcription system.  Any error is purely unintentional.

## 2020-10-17 NOTE — Progress Notes (Signed)
Pt tolerated dialysis well, net of 203ml removed. Report to [facility], lpn.

## 2020-10-17 NOTE — Progress Notes (Signed)
[facility]  ROUNDING NOTE   Subjective:   [name] was admitted to [facility] on 10/15/2020 for Gangrene [facility]) [I96] Diabetic foot ulcer ([place]) [E11.621, L97.509] Decreased pedal pulses [R09.89] Sepsis, due to unspecified organism, unspecified whether acute organ dysfunction present [facility]) [A41.9]  Last hemodialysis treatment was Saturday, 8/20. She was having hypotension on her treatment and she took midodrine to complete her treatment.   Patient seen during dialysis   HEMODIALYSIS FLOWSHEET:  Blood Flow Rate (mL/min): (P) 200 mL/min Arterial Pressure (mmHg): (P) -80 mmHg Venous Pressure (mmHg): (P) 70 mmHg Transmembrane Pressure (mmHg): (P) 20 mmHg Ultrafiltration Rate (mL/min): (P) 70 mL/min Dialysate Flow Rate (mL/min): (P) 500 ml/min Conductivity: Machine : (P) 13.8 Conductivity: Machine : (P) 13.8 Dialysis Fluid Bolus: Normal Saline Bolus Amount (mL): 250 mL  No complaints at this time   Objective:  Vital signs in last 24 hours:  Temp:  [98.2 F (36.8 C)-99.9 F (37.7 C)] 98.2 F (36.8 C) (08/23 0849) Pulse Rate:  [96-116] 108 (08/23 0849) Resp:  [15-20] 17 (08/23 0849) BP: (103-126)/(35-88) 103/88 (08/23 0849) SpO2:  [91 %-97 %] 97 % (08/23 0849)  Weight change:  Filed Weights   10/15/20 1905  Weight: 126.1 kg    Intake/Output: I/O last 3 completed shifts: In: 2090.2 [P.O.:240; I.V.:1060.2; IV Piggyback:790] Out: -    Intake/Output this shift:  No intake/output data recorded.  Physical Exam: General: NAD, sitting up in bed  Head: Normocephalic, atraumatic. Moist oral mucosal membranes  Eyes: Anicteric, PERRL  Neck: Supple, trachea midline  Lungs:  Clear to auscultation  Heart: Regular rate and rhythm  Abdomen:  Soft, nontender,   Extremities:  trace peripheral edema. Right foot with ulcer under fifth toe  Neurologic: Nonfocal, moving all four extremities  Skin: No lesions  Access: Left AVF    Basic Metabolic Panel: Recent  Labs  Lab 10/15/20 1958 10/16/20 0347 10/16/20 1402 10/17/20 0352  NA 130* 132* 128* 128*  K 4.7 4.5 5.0 5.0  CL 92* 94* 90* 91*  CO2 23 24 19* 21*  GLUCOSE 241* 217* 295* 194*  BUN 52* 55* 60* 76*  CREATININE 8.88* 9.18* 9.75* 10.68*  CALCIUM 9.3 9.1 9.1 9.4  MG  --   --   --  2.0     Liver Function Tests: Recent Labs  Lab 10/15/20 1958 10/16/20 0347  AST 64* 45*  ALT 59* 47*  ALKPHOS 559* 474*  BILITOT 0.7 0.9  PROT 8.3* 7.4  ALBUMIN 3.3* 2.7*    No results for input(s): LIPASE, AMYLASE in the last 168 hours. No results for input(s): AMMONIA in the last 168 hours.  CBC: Recent Labs  Lab 10/15/20 1958 10/16/20 1402 10/17/20 0352  WBC 22.1* 21.4* 20.8*  NEUTROABS 19.0*  --   --   HGB 9.8* 8.9* 9.3*  HCT 30.6* 28.5* 29.4*  MCV 96.5 97.6 95.1  PLT 358 325 314     Cardiac Enzymes: No results for input(s): CKTOTAL, CKMB, CKMBINDEX, TROPONINI in the last 168 hours.  BNP: Invalid input(s): POCBNP  CBG: Recent Labs  Lab 10/16/20 1650 10/16/20 2107 10/17/20 0843  GLUCAP 291* 252* 202*    Microbiology: Results for orders placed or performed during the hospital encounter of 10/15/20  Blood Culture (routine x 2)     Status: None (Preliminary result)   Collection Time: 10/15/20  8:03 PM   Specimen: BLOOD  Result Value Ref Range Status   Specimen Description BLOOD BLOOD RIGHT FOREARM  Final  Special Requests   Final    BOTTLES DRAWN AEROBIC AND ANAEROBIC Blood Culture adequate volume   Culture   Final    NO GROWTH 2 DAYS Performed at [facility], [address] [place], [place] [address]    Report Status PENDING  Incomplete  Resp Panel by RT-PCR (Flu A&B, Covid) Nasopharyngeal Swab     Status: None   Collection Time: 10/15/20  8:26 PM   Specimen: Nasopharyngeal Swab; Nasopharyngeal(NP) swabs in vial transport medium  Result Value Ref Range Status   SARS Coronavirus 2 by RT PCR NEGATIVE NEGATIVE Final    Comment: (NOTE) SARS-CoV-2  target nucleic acids are NOT DETECTED.  The SARS-CoV-2 RNA is generally detectable in upper respiratory specimens during the acute phase of infection. The lowest concentration of SARS-CoV-2 viral copies this assay can detect is 138 copies/mL. A negative result does not preclude SARS-Cov-2 infection and should not be used as the sole basis for treatment or other patient management decisions. A negative result may occur with  improper specimen collection/handling, submission of specimen other than nasopharyngeal swab, presence of viral mutation(s) within the areas targeted by this assay, and inadequate number of viral copies(<138 copies/mL). A negative result must be combined with clinical observations, patient history, and epidemiological information. The expected result is Negative.  Fact Sheet for Patients:  EntrepreneurPulse.com.au  Fact Sheet for Healthcare Providers:  IncredibleEmployment.be  This test is no t yet approved or cleared by the [place] FDA and  has been authorized for detection and/or diagnosis of SARS-CoV-2 by FDA under an Emergency Use Authorization (EUA). This EUA will remain  in effect (meaning this test can be used) for the duration of the COVID-19 declaration under Section 564(b)(1) of the Act, 21 U.S.C.section 360bbb-3(b)(1), unless the authorization is terminated  or revoked sooner.       Influenza A by PCR NEGATIVE NEGATIVE Final   Influenza B by PCR NEGATIVE NEGATIVE Final    Comment: (NOTE) The Xpert Xpress SARS-CoV-2/FLU/RSV plus assay is intended as an aid in the diagnosis of influenza from Nasopharyngeal swab specimens and should not be used as a sole basis for treatment. Nasal washings and aspirates are unacceptable for Xpert Xpress SARS-CoV-2/FLU/RSV testing.  Fact Sheet for Patients: EntrepreneurPulse.com.au  Fact Sheet for Healthcare  Providers: IncredibleEmployment.be  This test is not yet approved or cleared by the [place] FDA and has been authorized for detection and/or diagnosis of SARS-CoV-2 by FDA under an Emergency Use Authorization (EUA). This EUA will remain in effect (meaning this test can be used) for the duration of the COVID-19 declaration under Section 564(b)(1) of the Act, 21 U.S.C. section 360bbb-3(b)(1), unless the authorization is terminated or revoked.  Performed at [facility], [address] [place], [place] [address]   Blood Culture (routine x 2)     Status: None (Preliminary result)   Collection Time: 10/15/20  8:26 PM   Specimen: BLOOD  Result Value Ref Range Status   Specimen Description BLOOD RIGHT ANTECUBITAL  Final   Special Requests   Final    BOTTLES DRAWN AEROBIC AND ANAEROBIC Blood Culture adequate volume   Culture   Final    NO GROWTH 2 DAYS Performed at [facility], [address], [place], [place] [address]    Report Status PENDING  Incomplete    Coagulation Studies: Recent Labs    10/15/20 1958  LABPROT 14.9  INR 1.2     Urinalysis: No results for input(s): COLORURINE, LABSPEC, [place], [place],  HGBUR, BILIRUBINUR, KETONESUR, PROTEINUR, UROBILINOGEN, NITRITE, LEUKOCYTESUR in the last 72 hours.  Invalid input(s): APPERANCEUR    Imaging: DG Chest 2 View  Result Date: 10/15/2020 CLINICAL DATA:  Chest pain. EXAM: CHEST - 2 VIEW COMPARISON:  Chest radiograph dated 02/12/2018. FINDINGS: No focal consolidation, pleural effusion or pneumothorax. The cardiac silhouette is within normal limits. No acute osseous pathology. IMPRESSION: No active cardiopulmonary disease. Electronically Signed   By: Anner Crete M.D.   On: 10/15/2020 19:48   MR FOOT RIGHT WO CONTRAST  Result Date: 10/17/2020 CLINICAL DATA:  History of diabetes and end-stage renal disease. Soft tissue wound of the lateral forefoot. EXAM: MRI OF THE RIGHT  FOREFOOT WITHOUT CONTRAST TECHNIQUE: Multiplanar, multisequence MR imaging of the right foot was performed. No intravenous contrast was administered. COMPARISON:  None. FINDINGS: Bones/Joint/Cartilage Soft tissue wound of the fifth digit along the dorsal aspect. Bone marrow edema in the fifth metatarsal head and fifth proximal phalanx concerning for osteomyelitis. No acute fracture or dislocation. Normal alignment. No joint effusion. Mild osteoarthritis of the first MTP joint. Ligaments Collateral ligaments are intact. Lisfranc ligament is intact. Muscles and Tendons Flexor, peroneal and extensor compartment tendons are intact. There are small foci of low signal within the plantar musculature concerning for soft tissue air as can be seen with necrotizing infection. Mild edema within the plantar musculature. Soft tissue No fluid collection or hematoma. No soft tissue mass. Soft tissue edema throughout the dorsal aspect of the foot which may be reactive versus secondary to cellulitis. IMPRESSION: 1. Soft tissue wound of the fifth digit along the dorsal aspect. Bone marrow edema in the fifth metatarsal head and fifth proximal phalanx concerning for osteomyelitis. 2. There are small foci of low signal within the plantar musculature concerning for air as can be seen with necrotizing infection. 3. Soft tissue edema throughout the dorsal aspect of the foot which may be reactive versus secondary to cellulitis. Electronically Signed   By: Kathreen Devoid M.D.   On: 10/17/2020 06:23   US ARTERIAL ABI (SCREENING LOWER EXTREMITY)  Result Date: 10/16/2020 CLINICAL DATA:  Bilateral lower extremity pain and claudication. Right lower extremity gangrene. Diabetes, right vascular ulcers, hypertension, hyperlipidemia. EXAM: NONINVASIVE PHYSIOLOGIC VASCULAR STUDY OF BILATERAL LOWER EXTREMITIES TECHNIQUE: Evaluation of both lower extremities were performed at rest, including calculation of ankle-brachial indices with single level  Doppler, pressure and pulse volume recording. COMPARISON:  None. FINDINGS: Right ABI:  0.68 Left ABI:  0.59 Right Lower Extremity: Dorsalis pedis was noncompressible. Monophasic arterial waveforms at the ankle. Left Lower Extremity: Dorsalis pedis noncompressible. Monophasic waveforms at the ankle. IMPRESSION: Moderate bilateral lower extremity arterial occlusive disease at rest. Electronically Signed   By: Lucrezia Europe M.D.   On: 10/16/2020 12:29   DG Foot 2 Views Right  Result Date: 10/15/2020 CLINICAL DATA:  Fifth toe infection.  Evaluate for osteomyelitis. EXAM: RIGHT FOOT - 2 VIEW COMPARISON:  None. FINDINGS: There is no acute fracture or dislocation. No bone erosion or periosteal elevation to suggest acute osteomyelitis. The bones are osteopenic. Mild diffuse subcutaneous edema. Extensive vascular calcification of the foot. No radiopaque foreign object or soft tissue gas. IMPRESSION: 1. No acute fracture or dislocation. No radiographic evidence of acute osteomyelitis. 2. Osteopenia. Electronically Signed   By: Anner Crete M.D.   On: 10/15/2020 20:12     Medications:    sodium chloride Stopped (10/16/20 1111)   sodium chloride Stopped (10/16/20 1804)   sodium chloride     sodium chloride  ceFEPime (MAXIPIME) IV Stopped (10/16/20 1625)   vancomycin      aspirin EC  81 mg Oral Daily   atorvastatin  80 mg Oral q1800   Chlorhexidine Gluconate Cloth  6 each Topical Daily   cholecalciferol  4,000 Units Oral Weekly   cinacalcet  60 mg Oral Daily   docusate sodium  100 mg Oral Daily   ferric citrate  210 mg Oral TID WC   heparin  5,000 Units Subcutaneous Q8H   insulin aspart  0-5 Units Subcutaneous QHS   insulin aspart  0-5 Units Subcutaneous QHS   insulin aspart  0-9 Units Subcutaneous TID WC   insulin aspart  2 Units Subcutaneous TID WC   insulin glargine-yfgn  36 Units Subcutaneous QHS   linaclotide  290 mcg Oral q morning   mometasone-formoterol  2 puff Inhalation BID    pantoprazole  40 mg Oral Daily   sodium chloride flush  3 mL Intravenous Q12H   sodium chloride, sodium chloride, sodium chloride, acetaminophen, albuterol, albuterol, alteplase, alum & mag hydroxide-simeth, calcium carbonate, dextromethorphan-guaiFENesin, heparin, hydrALAZINE, ipratropium-albuterol, lactulose, lidocaine (PF), lidocaine-prilocaine, metoprolol tartrate, ondansetron, oxyCODONE, pentafluoroprop-tetrafluoroeth, polyethylene glycol, sodium chloride flush, traZODone  Assessment/ Plan:  [name] is a 50 y.o. black female with end stage renal disease on hemodialysis, diabetes mellitus type II, hypertension, congestive heart failure, COPD, who is admitted to [facility] on 10/15/2020 for Gangrene [facility]) [I96] Diabetic foot ulcer ([place]) [U76.546, L97.509] Decreased pedal pulses [R09.89] Sepsis, due to unspecified organism, unspecified whether acute organ dysfunction present [facility]) [A41.9]  [facility] T[facility] Left AVF 127.5kg  End Stage Renal Disease: last dialysis was Saturday. Received dialysis today, UF 2L removed. Tolerated well. Next treatment scheduled for Thursday  Hypotension: secondary to infection. Holding home regimen torsemide, losartan, clonidine. 118/50  Anemia with chronic kidney disease: hemoglobin 9.3. EPO with next HD treatment.   Secondary Hyperparathyroidism with hyeprphosphatemia: there is risk for calciphylaxis. Outpatinet labs on 8/9: phos 10.6, calcium 8.2, PTH 1456.  - resume  cinacalcet and sevelamer with meals.   Diabetes mellitus type II with chronic kidney disease: insulin dependent. Complicated with diabetic foot ulcer. History of poor control. Hemoglobin A1c 9.2% on 09/19/2020.  - Appreciate podiatry and vascular input.  - empiric ceftriaxone and metronidazole.  - Vascular to schedule RLE angiogram tomorrow with possible intervention     LOS: 2 Azaylea Maves 8/23/20229:45 AM

## 2020-10-17 NOTE — Consult Note (Signed)
PODIATRY CONSULTATION  NAME [name] MRN [id number] DOB Jan 10, 1971 DOA 10/15/2020   Reason for consult: OM RT 5th toe w/ ulcer Chief Complaint  Patient presents with   Chest Pain    History of present illness: 50 y.o. female PMHx DM type II, CKD stage III on dialysis admitted for worsening infection with fever and malaise upon presentation in the ED.  Patient states that she has been dealing with an ulcer to the right fifth toe for few months now.  She has been managed outpatient at the wound care center.  Upon admission podiatry consulted.  MRI performed.  Past Medical History:  Diagnosis Date   (HFpEF) heart failure with preserved ejection fraction ([place])    a. 11/2014 Echo: EF 50-55%, Gr1 DD; b. 06/2016 Echo: EF 60-65%, no rwma, mod dil LA, nl RV, triv eff.   Anxiety    Asthma    Bronchitis    CHF (congestive heart failure) (HCC)    Chronic cough    Chronic respiratory failure with hypoxia and hypercapnia ([place])    a. Home O2.  (2-4L)   CKD (chronic kidney disease), stage III (HCC)    Depression    Diabetes mellitus without complication (HCC)    Diverticulosis    Dyspnea    Dysrhythmia    per pt, no arrythmias   Environmental and seasonal allergies    Extreme obesity with alveolar hypoventilation (HCC)    Hypertension    Iron deficiency anemia    a. chronic blood loss->heavy menses.   Kidney failure    dialysis tues/thur/sat   ... left arm shunt   Orthopnea    Oxygen dependent    3l/min   Pericardial effusion    a. 11/2014 CT Chest: Mod effusion; b. 11/2014 Echo: small to mod circumferential effusion. No hemodynamic compromise.   Pneumonia    history   Poorly controlled diabetes mellitus ([place])    a. 11/2014 A1c 13.2.   Swelling of both lower extremities     CBC Latest Ref Rng & Units 10/17/2020 10/16/2020 10/15/2020  WBC 4.0 - 10.5 K/uL 20.8(H) 21.4(H) 22.1(H)  Hemoglobin 12.0 - 15.0 g/dL 9.3(L) 8.9(L) 9.8(L)  Hematocrit 36.0 - 46.0 % 29.4(L) 28.5(L) 30.6(L)   Platelets 150 - 400 K/uL 314 325 358    BMP Latest Ref Rng & Units 10/17/2020 10/16/2020 10/16/2020  Glucose 70 - 99 mg/dL 194(H) 295(H) 217(H)  BUN 6 - 20 mg/dL 76(H) 60(H) 55(H)  Creatinine 0.44 - 1.00 mg/dL 10.68(H) 9.75(H) 9.18(H)  Sodium 135 - 145 mmol/L 128(L) 128(L) 132(L)  Potassium 3.5 - 5.1 mmol/L 5.0 5.0 4.5  Chloride 98 - 111 mmol/L 91(L) 90(L) 94(L)  CO2 22 - 32 mmol/L 21(L) 19(L) 24  Calcium 8.9 - 10.3 mg/dL 9.4 9.1 9.1       Physical Exam: General: The patient is alert and oriented x3 in no acute distress.   Dermatology: Ulcer medial aspect right fifth toe.  Moderate malodor noted.  The right fifth toe appears somewhat necrotic in nature.  Please see above attached photo  Vascular: Diminished pedal pulses. Skin cool to touch. Edema noted diffusely to the foot and ankle  Neurological: Epicritic and protective threshold absentbilaterally.   Musculoskeletal Exam: No structural deformity noted.  MRI RT foot wo contrast 10/16/2020: IMPRESSION: 1. Soft tissue wound of the fifth digit along the dorsal aspect. Bone marrow edema in the fifth metatarsal head and fifth proximal phalanx concerning for osteomyelitis. 2. There are small foci of low signal  within the plantar musculature concerning for air as can be seen with necrotizing infection. 3. Soft tissue edema throughout the dorsal aspect of the foot which may be reactive versus secondary to cellulitis.  ASSESSMENT/PLAN OF CARE 1.  Likely osteomyelitis with diabetic ulcer right fifth toe -MRI was reviewed with the patient today.  The wound appears to probe very deep into the toe and overall the toe appears very necrotic -Recommended amputation of the toe to the patient.  The patient states that she would like to think about it.  We discussed the risks and benefits.  Explained to the patient that she is at high risk of the spread of infection.  Patient understands. -Patient was very polite and we did have a good  conversation.  Patient would like to have a second opinion regarding the toe amputation.  Recommend infectious disease provide a second opinion.  If they feel that the toe is salvageable with long-term IV antibiotics we could pursue this option in combination with aggressive wound care.  Their expertise and opinion would be greatly appreciated. -Patient scheduled for lower extremity angiography with vascular tomorrow. -We will round on the patient after vascular intervention to reevaluate and discuss different treatment options regarding the ulcer of the toe. -Podiatry will continue to follow     Thank you for the consult.  Please contact me directly with any questions or concerns.  Cell [phone]   Edrick Kins, DPM [facility]  Dr. Edrick Kins, DPM    [address],  [address]                Office [phone]  Fax [phone]

## 2020-10-17 NOTE — Progress Notes (Signed)
OT Cancellation Note  Patient Details Name: WAUNETA SILVERIA MRN: [id number] DOB: 12-03-1970   Cancelled Treatment:    Reason Eval/Treat Not Completed: Other (comment). Pt still awaiting podiatry consult. Vascular planning on angiogram 8/24 with possible intervention. OT to hold until more information available regarding POC and precautions for functional mobility for pt safety.  Darleen Crocker, MS, OTR/L , CBIS ascom [phone]  10/17/20, 9:12 AM

## 2020-10-17 NOTE — Progress Notes (Signed)
Spoke with Katrina from central telemetry at 0930 to transfer patient from room 157 to [facility].

## 2020-10-17 NOTE — Progress Notes (Signed)
Dr. Juleen China notified of elevated hr compared to start, no new orders received.

## 2020-10-17 NOTE — H&P (View-Only) (Signed)
[name]  MRN : [id number]  [name] is a 50 y.o. (Feb 27, 1970) female who presents with chief complaint of  Chief Complaint  Patient presents with   Chest Pain   History of Present Illness:  [name] is a 50 y.o. female seen in ed with complaints of generalized malaise.   Pt noted that her toe has been black and malodorous.Pt is being followed at wound care for her Diabetic foot wound and per patient it has been draining.  Pt has right foot diabetic foot wound since July and ulcer since 4 days.   At presentation, the patient noted to be dizzy, and her bp at dialysis was 88 and took midodrine and got her HD treatment was completed.    Heart was beating was fast, back and legs were hurting bad, ulcer was draining, and was tender, and cant walk on it. Pt has wound care appt tomorrow. Bgt are usually fair and 250's-70 sometimes.    Pt reports chills x 2 days and fever today that she did not know about.  Pt also reports vomiting x 1 month and xray of stomach by pcp and was told she was constipated.    Pt has past medical history of ESRD on HD, DM II,DFU, HTN, Anemia of CKD, COPD.  Vascular surgery was consulted by Dr Posey Pronto for a chronic nonhealing wound on the right foot in the setting of multiple risk factors for atherosclerotic disease.  Current Facility-Administered Medications  Medication Dose Route Frequency Provider Last Rate Last Admin   0.9 %  sodium chloride infusion  250 mL Intravenous PRN Para Skeans, MD   Stopped at 10/16/20 1111   0.9 %  sodium chloride infusion   Intravenous Continuous Dew, Erskine Squibb, MD   Stopped at 10/16/20 1804   0.9 %  sodium chloride infusion  100 mL Intravenous PRN Breeze, Benancio Deeds, NP       0.9 %  sodium chloride infusion  100 mL Intravenous PRN Colon Flattery, NP       acetaminophen (TYLENOL) tablet 1,000 mg  1,000 mg Oral Q6H PRN Para Skeans, MD   1,000 mg at 10/16/20 2038    albuterol (PROVENTIL) (2.5 MG/3ML) 0.083% nebulizer solution 2.5 mg  2.5 mg Nebulization Q6H PRN Para Skeans, MD   2.5 mg at 10/16/20 0522   albuterol (VENTOLIN HFA) 108 (90 Base) MCG/ACT inhaler 2 puff  2 puff Inhalation Q4H PRN Para Skeans, MD       alteplase (CATHFLO ACTIVASE) injection 2 mg  2 mg Intracatheter Once PRN Colon Flattery, NP       alum & mag hydroxide-simeth (MAALOX/MYLANTA) 200-200-20 MG/5ML suspension 30 mL  30 mL Oral Q4H PRN Amin, Ankit Chirag, MD   30 mL at 10/16/20 1622   aspirin EC tablet 81 mg  81 mg Oral Daily Para Skeans, MD   81 mg at 10/17/20 0917   atorvastatin (LIPITOR) tablet 80 mg  80 mg Oral q1800 Para Skeans, MD   80 mg at 10/16/20 1707   calcium carbonate (TUMS - dosed in mg elemental calcium) chewable tablet 200 mg of elemental calcium  1 tablet Oral TID PRN Damita Lack, MD   200 mg of elemental calcium at 10/16/20 1622   ceFEPIme (MAXIPIME) 1 g in sodium chloride 0.9 % 100 mL IVPB  1 g Intravenous Q24H Rito Ehrlich A, RPH   Stopped at 10/16/20 1625   Chlorhexidine  Gluconate Cloth 2 % PADS 6 each  6 each Topical Daily Amin, Ankit Chirag, MD       cholecalciferol (VITAMIN D) tablet 4,000 Units  4,000 Units Oral Weekly Para Skeans, MD       cinacalcet (SENSIPAR) tablet 60 mg  60 mg Oral Daily Amin, Ankit Chirag, MD       dextromethorphan-guaiFENesin ([place] DM) 30-600 MG per 12 hr tablet 1 tablet  1 tablet Oral BID PRN Damita Lack, MD   1 tablet at 10/16/20 1049   docusate sodium (COLACE) capsule 100 mg  100 mg Oral Daily Para Skeans, MD   100 mg at 10/17/20 7353   ferric citrate (AURYXIA) tablet 210 mg  210 mg Oral TID WC Para Skeans, MD   210 mg at 10/17/20 0917   heparin injection 1,000 Units  1,000 Units Dialysis PRN Colon Flattery, NP       heparin injection 5,000 Units  5,000 Units Subcutaneous Q8H Para Skeans, MD   5,000 Units at 10/17/20 0600   hydrALAZINE (APRESOLINE) injection 10 mg  10 mg Intravenous Q4H PRN Amin,  Ankit Chirag, MD       insulin aspart (novoLOG) injection 0-5 Units  0-5 Units Subcutaneous QHS Amin, Ankit Chirag, MD       insulin aspart (novoLOG) injection 0-9 Units  0-9 Units Subcutaneous TID WC Amin, Ankit Chirag, MD   3 Units at 10/17/20 0800   insulin aspart (novoLOG) injection 2 Units  2 Units Subcutaneous TID WC Amin, Ankit Chirag, MD   2 Units at 10/17/20 0800   insulin glargine-yfgn (SEMGLEE) injection 36 Units  36 Units Subcutaneous QHS Lorna Dibble, RPH   36 Units at 10/16/20 2109   ipratropium-albuterol (DUONEB) 0.5-2.5 (3) MG/3ML nebulizer solution 3 mL  3 mL Nebulization Q6H PRN Para Skeans, MD   3 mL at 10/17/20 0424   lactulose (CHRONULAC) 10 GM/15ML solution 20 g  20 g Oral Daily PRN Para Skeans, MD   20 g at 10/16/20 1049   lidocaine (PF) (XYLOCAINE) 1 % injection 5 mL  5 mL Intradermal PRN Colon Flattery, NP       lidocaine-prilocaine (EMLA) cream 1 application  1 application Topical PRN Colon Flattery, NP       linaclotide (LINZESS) capsule 290 mcg  290 mcg Oral q morning Amin, Ankit Chirag, MD       metoprolol tartrate (LOPRESSOR) injection 5 mg  5 mg Intravenous Q4H PRN Amin, Ankit Chirag, MD   5 mg at 10/16/20 1050   mometasone-formoterol (DULERA) 200-5 MCG/ACT inhaler 2 puff  2 puff Inhalation BID Para Skeans, MD   2 puff at 10/17/20 0830   ondansetron (ZOFRAN-ODT) disintegrating tablet 4 mg  4 mg Oral Q8H PRN Para Skeans, MD       oxyCODONE (Oxy IR/ROXICODONE) immediate release tablet 5 mg  5 mg Oral Q4H PRN Amin, Ankit Chirag, MD   5 mg at 10/17/20 0502   pantoprazole (PROTONIX) EC tablet 40 mg  40 mg Oral Daily Amin, Ankit Chirag, MD   40 mg at 10/17/20 2992   pentafluoroprop-tetrafluoroeth (GEBAUERS) aerosol 1 application  1 application Topical PRN Colon Flattery, NP       polyethylene glycol (MIRALAX / GLYCOLAX) packet 17 g  17 g Oral Daily PRN Para Skeans, MD       sodium chloride flush (NS) 0.9 % injection 3 mL  3 mL Intravenous Q12H Para Skeans, MD  3 mL at 10/17/20 1245   sodium chloride flush (NS) 0.9 % injection 3 mL  3 mL Intravenous PRN Para Skeans, MD       traZODone (DESYREL) tablet 50 mg  50 mg Oral QHS PRN Amin, Ankit Chirag, MD       vancomycin (VANCOCIN) IVPB 1000 mg/200 mL premix  1,000 mg Intravenous Q T,Th,Sa-HD Pearla Dubonnet, RPH       Past Medical History:  Diagnosis Date   (HFpEF) heart failure with preserved ejection fraction ([place])    a. 11/2014 Echo: EF 50-55%, Gr1 DD; b. 06/2016 Echo: EF 60-65%, no rwma, mod dil LA, nl RV, triv eff.   Anxiety    Asthma    Bronchitis    CHF (congestive heart failure) (HCC)    Chronic cough    Chronic respiratory failure with hypoxia and hypercapnia ([place])    a. Home O2.  (2-4L)   CKD (chronic kidney disease), stage III (HCC)    Depression    Diabetes mellitus without complication (HCC)    Diverticulosis    Dyspnea    Dysrhythmia    per pt, no arrythmias   Environmental and seasonal allergies    Extreme obesity with alveolar hypoventilation (HCC)    Hypertension    Iron deficiency anemia    a. chronic blood loss->heavy menses.   Kidney failure    dialysis tues/thur/sat   ... left arm shunt   Orthopnea    Oxygen dependent    3l/min   Pericardial effusion    a. 11/2014 CT Chest: Mod effusion; b. 11/2014 Echo: small to mod circumferential effusion. No hemodynamic compromise.   Pneumonia    history   Poorly controlled diabetes mellitus ([place])    a. 11/2014 A1c 13.2.   Swelling of both lower extremities    Past Surgical History:  Procedure Laterality Date   A/V FISTULAGRAM Left 06/09/2017   Procedure: A/V FISTULAGRAM;  Surgeon: Algernon Huxley, MD;  Location: [place] [facility];  Service: Cardiovascular;  Laterality: Left;   A/V FISTULAGRAM Left 06/16/2017   Procedure: A/V FISTULAGRAM;  Surgeon: Algernon Huxley, MD;  Location: [place] [facility];  Service: Cardiovascular;  Laterality: Left;   A/V FISTULAGRAM Left 01/28/2018   Procedure: A/V FISTULAGRAM;   Surgeon: Algernon Huxley, MD;  Location: [place] [facility];  Service: Cardiovascular;  Laterality: Left;   AV FISTULA PLACEMENT Left 04/25/2017   Procedure: ARTERIOVENOUS (AV) FISTULA CREATION;  Surgeon: Algernon Huxley, MD;  Location: [facility];  Service: Vascular;  Laterality: Left;   CATARACT EXTRACTION W/PHACO Left 01/21/2018   Procedure: CATARACT EXTRACTION PHACO AND INTRAOCULAR LENS PLACEMENT (IOC);  Surgeon: Leandrew Koyanagi, MD;  Location: [facility];  Service: Ophthalmology;  Laterality: Left;  [place] 01:55.5 CDE 9.05 EAUP 13.9 Fluid Pack Lot # [id number]   CESAREAN SECTION     times 3   DIALYSIS/PERMA CATHETER INSERTION Right 04/04/2017   Procedure: DIALYSIS/PERMA CATHETER INSERTION;  Surgeon: Katha Cabal, MD;  Location: [place] [facility];  Service: Cardiovascular;  Laterality: Right;   DIALYSIS/PERMA CATHETER REMOVAL N/A 09/17/2017   Procedure: DIALYSIS/PERMA CATHETER REMOVAL;  Surgeon: Algernon Huxley, MD;  Location: [place] [facility];  Service: Cardiovascular;  Laterality: N/A;   INSERTION OF DIALYSIS CATHETER N/A 04/16/2017   Procedure: INSERTION OF DIALYSIS PERM CATHETER;  Surgeon: Algernon Huxley, MD;  Location: [facility];  Service: Vascular;  Laterality: N/A;   Social History Social History   Tobacco Use   Smoking  status: Never   Smokeless tobacco: Never  Vaping Use   Vaping Use: Never used  Substance Use Topics   Alcohol use: Yes    Alcohol/week: 0.0 standard drinks    Comment: occasional   Drug use: Yes    Types: Marijuana   Family History Family History  Problem Relation Age of Onset   Diabetes Mother    Hypertension Mother    Hypertension Father    Diabetes Father    Hypertension Other    Diabetes Other    Breast cancer Maternal Aunt 67   Breast cancer Maternal Aunt 40  Patient denies any family history of peripheral artery disease, venous disease or renal disease.  Allergies  Allergen Reactions   Dust Mite Extract Shortness Of Breath and Itching   Eggs Or  Egg-Derived Products Itching    MAKES TONGUE ITCH but she will eat, only in small amounts   Mold Extract [Trichophyton] Shortness Of Breath and Itching   Pollen Extract Shortness Of Breath    Itching, shortness of breath, asthma like symptoms   Sulfa Antibiotics Itching   Feraheme [Ferumoxytol] Itching    Uses benadryl so she can get the medicine   REVIEW OF SYSTEMS (Negative unless checked)  Constitutional: [] Weight loss  [] Fever  [] Chills Cardiac: [] Chest pain   [] Chest pressure   [] Palpitations   [] Shortness of breath when laying flat   [] Shortness of breath at rest   [] Shortness of breath with exertion. Vascular:  [] Pain in legs with walking   [x] Pain in legs at rest   [] Pain in legs when laying flat   [] Claudication   [] Pain in feet when walking  [] Pain in feet at rest  [] Pain in feet when laying flat   [] History of DVT   [] Phlebitis   [x] Swelling in legs   [] Varicose veins   [] Non-healing ulcers Pulmonary:   [] Uses home oxygen   [] Productive cough   [] Hemoptysis   [] Wheeze  [] COPD   [] Asthma Neurologic:  [] Dizziness  [] Blackouts   [] Seizures   [] History of stroke   [] History of TIA  [] Aphasia   [] Temporary blindness   [] Dysphagia   [] Weakness or numbness in arms   [] Weakness or numbness in legs Musculoskeletal:  [] Arthritis   [] Joint swelling   [] Joint pain   [] Low back pain Hematologic:  [] Easy bruising  [] Easy bleeding   [] Hypercoagulable state   [] Anemic  [] Hepatitis Gastrointestinal:  [] Blood in stool   [] Vomiting blood  [] Gastroesophageal reflux/heartburn   [] Difficulty swallowing. Genitourinary:  [x] Chronic kidney disease   [] Difficult urination  [] Frequent urination  [] Burning with urination   [] Blood in urine Skin:  [] Rashes   [x] Ulcers   [x] Wounds Psychological:  [] History of anxiety   []  History of major depression.  Physical Examination  Vitals:   10/17/20 1115 10/17/20 1130 10/17/20 1145 10/17/20 1200  BP: (!) 133/59 (!) 122/58 (!) 123/43 (!) 118/50  Pulse: (!) 103 (!)  104 (!) 105 (!) 106  Resp: (!) 21 20 18 19   Temp:      TempSrc:      SpO2:      Weight:      Height:       Body mass index is 46.98 kg/m. Gen:  WD/WN, NAD Head: [place]/AT, No temporalis wasting. Prominent temp pulse not noted. Ear/Nose/Throat: Hearing grossly intact, nares w/o erythema or drainage, oropharynx w/o Erythema/Exudate Eyes: Sclera non-icteric, conjunctiva clear Neck: Trachea midline.  No JVD.  Pulmonary:  Good air movement, respirations not labored, equal bilaterally.  Cardiac:  RRR, normal S1, S2. Vascular:  Vessel Right Left  Radial Palpable Palpable  Ulnar Palpable Palpable  Brachial Palpable Palpable  Carotid Palpable, without bruit Palpable, without bruit  Aorta Not palpable N/A  Femoral Palpable Palpable  Popliteal Palpable Palpable  PT Non-Palpable Non-Palpable  DP Non-Palpable Non-Palpable   Right lower extremity: Thigh soft. Calf soft. Hard to palpate pedal pulses the extremity is warm however due to body habitus and edema hard to palpate.  Motor/sensory is intact.  Wound noted to the dorsal aspect of the fifth toe on the right foot.  Gastrointestinal: soft, non-tender/non-distended. No guarding/reflex.  Musculoskeletal: M/S 5/5 throughout.  Extremities without ischemic changes.  No deformity or atrophy. No edema. Neurologic: Sensation grossly intact in extremities.  Symmetrical.  Speech is fluent. Motor exam as listed above. Psychiatric: Judgment intact, Mood & affect appropriate for pt's clinical situation. Dermatologic: As above Lymph : No Cervical, Axillary, or Inguinal lymphadenopathy.  CBC Lab Results  Component Value Date   WBC 20.8 (H) 10/17/2020   HGB 9.3 (L) 10/17/2020   HCT 29.4 (L) 10/17/2020   MCV 95.1 10/17/2020   PLT 314 10/17/2020   BMET    Component Value Date/Time   NA 128 (L) 10/17/2020 0352   NA 135 (L) 11/21/2013 1153   K 5.0 10/17/2020 0352   K 4.1 11/21/2013 1153   CL 91 (L) 10/17/2020 0352   CL 98 11/21/2013 1153   CO2 21  (L) 10/17/2020 0352   CO2 30 11/21/2013 1153   GLUCOSE 194 (H) 10/17/2020 0352   GLUCOSE 287 (H) 11/21/2013 1153   BUN 76 (H) 10/17/2020 0352   BUN 16 11/21/2013 1153   CREATININE 10.68 (H) 10/17/2020 0352   CREATININE 0.85 11/21/2013 1153   CALCIUM 9.4 10/17/2020 0352   CALCIUM 8.7 11/21/2013 1153   GFRNONAA 4 (L) 10/17/2020 0352   GFRNONAA >60 11/21/2013 1153   GFRAA 15 (L) 04/26/2017 0609   GFRAA >60 11/21/2013 1153   Estimated Creatinine Clearance: 8.4 mL/min (A) (by C-G formula based on SCr of 10.68 mg/dL (H)).  COAG Lab Results  Component Value Date   INR 1.2 10/15/2020   Radiology DG Chest 2 View  Result Date: 10/15/2020 CLINICAL DATA:  Chest pain. EXAM: CHEST - 2 VIEW COMPARISON:  Chest radiograph dated 02/12/2018. FINDINGS: No focal consolidation, pleural effusion or pneumothorax. The cardiac silhouette is within normal limits. No acute osseous pathology. IMPRESSION: No active cardiopulmonary disease. Electronically Signed   By: Anner Crete M.D.   On: 10/15/2020 19:48   MR FOOT RIGHT WO CONTRAST  Result Date: 10/17/2020 CLINICAL DATA:  History of diabetes and end-stage renal disease. Soft tissue wound of the lateral forefoot. EXAM: MRI OF THE RIGHT FOREFOOT WITHOUT CONTRAST TECHNIQUE: Multiplanar, multisequence MR imaging of the right foot was performed. No intravenous contrast was administered. COMPARISON:  None. FINDINGS: Bones/Joint/Cartilage Soft tissue wound of the fifth digit along the dorsal aspect. Bone marrow edema in the fifth metatarsal head and fifth proximal phalanx concerning for osteomyelitis. No acute fracture or dislocation. Normal alignment. No joint effusion. Mild osteoarthritis of the first MTP joint. Ligaments Collateral ligaments are intact. Lisfranc ligament is intact. Muscles and Tendons Flexor, peroneal and extensor compartment tendons are intact. There are small foci of low signal within the plantar musculature concerning for soft tissue air as can  be seen with necrotizing infection. Mild edema within the plantar musculature. Soft tissue No fluid collection or hematoma. No soft tissue mass. Soft tissue edema throughout the dorsal aspect of  the foot which may be reactive versus secondary to cellulitis. IMPRESSION: 1. Soft tissue wound of the fifth digit along the dorsal aspect. Bone marrow edema in the fifth metatarsal head and fifth proximal phalanx concerning for osteomyelitis. 2. There are small foci of low signal within the plantar musculature concerning for air as can be seen with necrotizing infection. 3. Soft tissue edema throughout the dorsal aspect of the foot which may be reactive versus secondary to cellulitis. Electronically Signed   By: Kathreen Devoid M.D.   On: 10/17/2020 06:23   US ARTERIAL ABI (SCREENING LOWER EXTREMITY)  Result Date: 10/16/2020 CLINICAL DATA:  Bilateral lower extremity pain and claudication. Right lower extremity gangrene. Diabetes, right vascular ulcers, hypertension, hyperlipidemia. EXAM: NONINVASIVE PHYSIOLOGIC VASCULAR STUDY OF BILATERAL LOWER EXTREMITIES TECHNIQUE: Evaluation of both lower extremities were performed at rest, including calculation of ankle-brachial indices with single level Doppler, pressure and pulse volume recording. COMPARISON:  None. FINDINGS: Right ABI:  0.68 Left ABI:  0.59 Right Lower Extremity: Dorsalis pedis was noncompressible. Monophasic arterial waveforms at the ankle. Left Lower Extremity: Dorsalis pedis noncompressible. Monophasic waveforms at the ankle. IMPRESSION: Moderate bilateral lower extremity arterial occlusive disease at rest. Electronically Signed   By: Lucrezia Europe M.D.   On: 10/16/2020 12:29   DG Foot 2 Views Right  Result Date: 10/15/2020 CLINICAL DATA:  Fifth toe infection.  Evaluate for osteomyelitis. EXAM: RIGHT FOOT - 2 VIEW COMPARISON:  None. FINDINGS: There is no acute fracture or dislocation. No bone erosion or periosteal elevation to suggest acute osteomyelitis. The  bones are osteopenic. Mild diffuse subcutaneous edema. Extensive vascular calcification of the foot. No radiopaque foreign object or soft tissue gas. IMPRESSION: 1. No acute fracture or dislocation. No radiographic evidence of acute osteomyelitis. 2. Osteopenia. Electronically Signed   By: Anner Crete M.D.   On: 10/15/2020 20:12    Assessment/Plan The patient is a 50 year old female with multiple medical issues who presents with a chronic nonhealing wound of over a month to the dorsum of the right fifth toe  1.  Possible atherosclerotic disease with gangrene: 1 formation occurred approximately 1 month ago.  Patient notes progressively worsening nonhealing chronic wound to the fifth toe.  Patient was prompted to seek medical attention when this wound started to drain and smell.  Hard to palpate pedal pulses on exam due to body habitus and edema however in the setting of a chronic wound multiple risk factors for atherosclerotic disease recommend the patient undergo a right lower extremity angiogram possible intervention and attempt assess her anatomy and contributing degree of atherosclerotic disease.  If appropriate an attempt to revascularize extremity to be made at that time.  Procedure, risks and benefits were explained to the patient.  All questions were answered.  The patient wishes to proceed.  Plan on this tomorrow with Dr. Lucky Cowboy  2.  End-stage renal disease: Patient currently denies any issues with her existing dialysis access.  3.  Chronic oxygen: Patient is on 3 L at baseline Try to use minimal amounts of sedation as possible  4.  Diabetes: Uncontrolled. Encouraged good control as its slows the progression of atherosclerotic disease   Discussed with Dr. Mayme Genta, PA-C 10/17/2020 1:47 PM  This [name] was created with Dragon medical transcription system.  Any error is purely unintentional.

## 2020-10-17 NOTE — Progress Notes (Signed)
   10/17/20 1430  Clinical Encounter Type  Visited With Patient  Visit Type Follow-up  Referral From Nurse  Consult/Referral To Chaplain  Spiritual Encounters  Spiritual Needs Prayer;Emotional  Stress Factors  Patient Stress Factors Health changes  Chaplain Brace Welte responded to an OR for room 1A-157 Pt, Mrs. [name] I provided reflective listening, followed by storytelling and prayer

## 2020-10-17 NOTE — Consult Note (Addendum)
NAME: [name]  DOB: 13-May-1970  MRN: [id number]  Date/Time: 10/17/2020 3:25 PM  REQUESTING PROVIDER: Dr.Amin Subjective:  REASON FOR CONSULT: DFI ? [name] is a 50 y.o. with a history of ESRD on dialysis, CHF, DM, HTN Presents with foul smelling wound rt foot- pt says she has neuropathy and on July 4th developed a hole on the rt foot in the wed space between 4ht and 5th. She was followed at the wound clinic and was given Aquacel dressing. Over the weekend the wound got worse and had a foul discharge.  Her BP was low at dialysis and she had to take midodrine and to complete the treatment.  She had palpitations and also had pain in her back and legs.  She was not able to walk on that foot.  She was also having chills for 2 days but never had fever at home.  In the ED pressure was 100.3.  BP was 116/71, pulse 126, respiratory 25.  WBC 22.1, Hb 9.8, platelet 358.  Blood cultures were sent and she was started on Vanco cefepime. I am asked to see the patient for antibiotic management Vascular is planning angio tomorrow. Seen by podiatrist. Past Medical History:  Diagnosis Date   (HFpEF) heart failure with preserved ejection fraction ([place])    a. 11/2014 Echo: EF 50-55%, Gr1 DD; b. 06/2016 Echo: EF 60-65%, no rwma, mod dil LA, nl RV, triv eff.   Anxiety    Asthma    Bronchitis    CHF (congestive heart failure) (HCC)    Chronic cough    Chronic respiratory failure with hypoxia and hypercapnia ([place])    a. Home O2.  (2-4L)   CKD (chronic kidney disease), stage III (HCC)    Depression    Diabetes mellitus without complication (HCC)    Diverticulosis    Dyspnea    Dysrhythmia    per pt, no arrythmias   Environmental and seasonal allergies    Extreme obesity with alveolar hypoventilation (HCC)    Hypertension    Iron deficiency anemia    a. chronic blood loss->heavy menses.   Kidney failure    dialysis tues/thur/sat   ... left arm shunt   Orthopnea    Oxygen dependent    3l/min    Pericardial effusion    a. 11/2014 CT Chest: Mod effusion; b. 11/2014 Echo: small to mod circumferential effusion. No hemodynamic compromise.   Pneumonia    history   Poorly controlled diabetes mellitus ([place])    a. 11/2014 A1c 13.2.   Swelling of both lower extremities     Past Surgical History:  Procedure Laterality Date   A/V FISTULAGRAM Left 06/09/2017   Procedure: A/V FISTULAGRAM;  Surgeon: Algernon Huxley, MD;  Location: [facility];  Service: Cardiovascular;  Laterality: Left;   A/V FISTULAGRAM Left 06/16/2017   Procedure: A/V FISTULAGRAM;  Surgeon: Algernon Huxley, MD;  Location: [facility];  Service: Cardiovascular;  Laterality: Left;   A/V FISTULAGRAM Left 01/28/2018   Procedure: A/V FISTULAGRAM;  Surgeon: Algernon Huxley, MD;  Location: [facility];  Service: Cardiovascular;  Laterality: Left;   AV FISTULA PLACEMENT Left 04/25/2017   Procedure: ARTERIOVENOUS (AV) FISTULA CREATION;  Surgeon: Algernon Huxley, MD;  Location: [facility];  Service: Vascular;  Laterality: Left;   CATARACT EXTRACTION W/PHACO Left 01/21/2018   Procedure: CATARACT EXTRACTION PHACO AND INTRAOCULAR LENS PLACEMENT (IOC);  Surgeon: Leandrew Koyanagi, MD;  Location: [facility];  Service: Ophthalmology;  Laterality: Left;  [place] 01:55.5 CDE 9.05 EAUP 13.9 Fluid Pack Lot # [id number]   CESAREAN SECTION     times 3   DIALYSIS/PERMA CATHETER INSERTION Right 04/04/2017   Procedure: DIALYSIS/PERMA CATHETER INSERTION;  Surgeon: Katha Cabal, MD;  Location: [facility];  Service: Cardiovascular;  Laterality: Right;   DIALYSIS/PERMA CATHETER REMOVAL N/A 09/17/2017   Procedure: DIALYSIS/PERMA CATHETER REMOVAL;  Surgeon: Algernon Huxley, MD;  Location: [facility];  Service: Cardiovascular;  Laterality: N/A;   INSERTION OF DIALYSIS CATHETER N/A 04/16/2017   Procedure: INSERTION OF DIALYSIS PERM CATHETER;  Surgeon: Algernon Huxley, MD;  Location: [facility];  Service: Vascular;  Laterality: N/A;     Social History   Socioeconomic History   Marital status: Married    Spouse name: abjul   Number of children: Not on file   Years of education: 12   Highest education level: High school graduate  Occupational History   Occupation: Disabled  Tobacco Use   Smoking status: Never   Smokeless tobacco: Never  Vaping Use   Vaping Use: Never used  Substance and Sexual Activity   Alcohol use: Yes    Alcohol/week: 0.0 standard drinks    Comment: occasional   Drug use: Yes    Types: Marijuana   Sexual activity: Not on file  Other Topics Concern   Not on file  Social History Narrative   Lives in [place] with her husband and 42 y/o child.  Three other children are grown and out of the house.  She is sedentary and does not routinely exercise.  On home O2.   Social Determinants of Health   Financial Resource Strain: Not on file  Food Insecurity: Not on file  Transportation Needs: Not on file  Physical Activity: Not on file  Stress: Not on file  Social Connections: Not on file  Intimate Partner Violence: Not on file    Family History  Problem Relation Age of Onset   Diabetes Mother    Hypertension Mother    Hypertension Father    Diabetes Father    Hypertension Other    Diabetes Other    Breast cancer Maternal Aunt 2   Breast cancer Maternal Aunt 50   Allergies  Allergen Reactions   Dust Mite Extract Shortness Of Breath and Itching   Eggs Or Egg-Derived Products Itching    MAKES TONGUE ITCH but she will eat, only in small amounts   Mold Extract [Trichophyton] Shortness Of Breath and Itching   Pollen Extract Shortness Of Breath    Itching, shortness of breath, asthma like symptoms   Sulfa Antibiotics Itching   Feraheme [Ferumoxytol] Itching    Uses benadryl so she can get the medicine   I? Current Facility-Administered Medications  Medication Dose Route Frequency Provider Last Rate Last Admin   0.9 %  sodium chloride infusion  250 mL Intravenous PRN Para Skeans, MD    Stopped at 10/16/20 1111   acetaminophen (TYLENOL) tablet 1,000 mg  1,000 mg Oral Q6H PRN Para Skeans, MD   1,000 mg at 10/16/20 2038   albuterol (PROVENTIL) (2.5 MG/3ML) 0.083% nebulizer solution 2.5 mg  2.5 mg Nebulization Q6H PRN Para Skeans, MD   2.5 mg at 10/17/20 1411   albuterol (VENTOLIN HFA) 108 (90 Base) MCG/ACT inhaler 2 puff  2 puff Inhalation Q4H PRN Para Skeans, MD       alum & mag hydroxide-simeth (MAALOX/MYLANTA) 200-200-20 MG/5ML suspension 30 mL  30  mL Oral Q4H PRN Damita Lack, MD   30 mL at 10/16/20 1622   aspirin EC tablet 81 mg  81 mg Oral Daily Para Skeans, MD   81 mg at 10/17/20 0917   atorvastatin (LIPITOR) tablet 80 mg  80 mg Oral q1800 Para Skeans, MD   80 mg at 10/16/20 1707   calcium carbonate (TUMS - dosed in mg elemental calcium) chewable tablet 200 mg of elemental calcium  1 tablet Oral TID PRN Damita Lack, MD   200 mg of elemental calcium at 10/16/20 1622   ceFEPIme (MAXIPIME) 1 g in sodium chloride 0.9 % 100 mL IVPB  1 g Intravenous Q24H Rito Ehrlich A, RPH   Stopped at 10/16/20 1625   Chlorhexidine Gluconate Cloth 2 % PADS 6 each  6 each Topical Daily Damita Lack, MD   6 each at 10/17/20 1400   cholecalciferol (VITAMIN D) tablet 4,000 Units  4,000 Units Oral Weekly Para Skeans, MD       cinacalcet (SENSIPAR) tablet 60 mg  60 mg Oral Daily Amin, Ankit Chirag, MD   60 mg at 10/17/20 1411   dextromethorphan-guaiFENesin (Hollister DM) 30-600 MG per 12 hr tablet 1 tablet  1 tablet Oral BID PRN Damita Lack, MD   1 tablet at 10/16/20 1049   docusate sodium (COLACE) capsule 100 mg  100 mg Oral Daily Florina Ou V, MD   100 mg at 10/17/20 0917   ferric citrate (AURYXIA) tablet 210 mg  210 mg Oral TID WC Florina Ou V, MD   210 mg at 10/17/20 1411   heparin injection 5,000 Units  5,000 Units Subcutaneous Q8H Para Skeans, MD   5,000 Units at 10/17/20 1411   hydrALAZINE (APRESOLINE) injection 10 mg  10 mg Intravenous Q4H PRN Amin, Ankit  Chirag, MD       insulin aspart (novoLOG) injection 0-5 Units  0-5 Units Subcutaneous QHS Amin, Ankit Chirag, MD       insulin aspart (novoLOG) injection 0-9 Units  0-9 Units Subcutaneous TID WC Amin, Ankit Chirag, MD   1 Units at 10/17/20 1437   insulin aspart (novoLOG) injection 2 Units  2 Units Subcutaneous TID WC Amin, Ankit Chirag, MD   2 Units at 10/17/20 1438   insulin glargine-yfgn (SEMGLEE) injection 36 Units  36 Units Subcutaneous QHS Lorna Dibble, RPH   36 Units at 10/16/20 2109   ipratropium-albuterol (DUONEB) 0.5-2.5 (3) MG/3ML nebulizer solution 3 mL  3 mL Nebulization Q6H PRN Florina Ou V, MD   3 mL at 10/17/20 0424   lactulose (CHRONULAC) 10 GM/15ML solution 20 g  20 g Oral Daily PRN Para Skeans, MD   20 g at 10/16/20 1049   linaclotide (LINZESS) capsule 290 mcg  290 mcg Oral q morning Amin, Ankit Chirag, MD   290 mcg at 10/17/20 1410   metoprolol tartrate (LOPRESSOR) injection 5 mg  5 mg Intravenous Q4H PRN Amin, Ankit Chirag, MD   5 mg at 10/16/20 1050   mometasone-formoterol (DULERA) 200-5 MCG/ACT inhaler 2 puff  2 puff Inhalation BID Para Skeans, MD   2 puff at 10/17/20 0830   ondansetron (ZOFRAN-ODT) disintegrating tablet 4 mg  4 mg Oral Q8H PRN Para Skeans, MD       oxyCODONE (Oxy IR/ROXICODONE) immediate release tablet 5 mg  5 mg Oral Q4H PRN Amin, Ankit Chirag, MD   5 mg at 10/17/20 1411   pantoprazole (PROTONIX) EC tablet 40  mg  40 mg Oral Daily Amin, Ankit Chirag, MD   40 mg at 10/17/20 0917   polyethylene glycol (MIRALAX / GLYCOLAX) packet 17 g  17 g Oral Daily PRN Para Skeans, MD       sodium chloride flush (NS) 0.9 % injection 3 mL  3 mL Intravenous Q12H Para Skeans, MD   3 mL at 10/17/20 3790   sodium chloride flush (NS) 0.9 % injection 3 mL  3 mL Intravenous PRN Para Skeans, MD       traZODone (DESYREL) tablet 50 mg  50 mg Oral QHS PRN Amin, Ankit Chirag, MD       vancomycin (VANCOCIN) IVPB 1000 mg/200 mL premix  1,000 mg Intravenous Q T,Th,Sa-HD  Nazari, Walid A, RPH         Abtx:  Anti-infectives (From admission, onward)    Start     Dose/Rate Route Frequency Ordered Stop   10/17/20 1200  vancomycin (VANCOCIN) IVPB 1000 mg/200 mL premix        1,000 mg 200 mL/hr over 60 Minutes Intravenous Every T-Th-Sa (Hemodialysis) 10/16/20 1456     10/16/20 2100  cefTRIAXone (ROCEPHIN) 1 g in sodium chloride 0.9 % 100 mL IVPB  Status:  Discontinued        1 g 200 mL/hr over 30 Minutes Intravenous Every 24 hours 10/15/20 2142 10/16/20 1308   10/16/20 1500  ceFEPIme (MAXIPIME) 1 g in sodium chloride 0.9 % 100 mL IVPB        1 g 200 mL/hr over 30 Minutes Intravenous Every 24 hours 10/16/20 1348 10/23/20 1459   10/16/20 0300  metroNIDAZOLE (FLAGYL) IVPB 500 mg  Status:  Discontinued        500 mg 100 mL/hr over 60 Minutes Intravenous Every 8 hours 10/16/20 0241 10/16/20 1308   10/15/20 2030  vancomycin (VANCOREADY) IVPB 1500 mg/300 mL        1,500 mg 150 mL/hr over 120 Minutes Intravenous  Once 10/15/20 2029 10/16/20 0255   10/15/20 2000  vancomycin (VANCOCIN) IVPB 1000 mg/200 mL premix        1,000 mg 200 mL/hr over 60 Minutes Intravenous  Once 10/15/20 1958 10/15/20 2250   10/15/20 2000  cefTRIAXone (ROCEPHIN) 2 g in sodium chloride 0.9 % 100 mL IVPB        2 g 200 mL/hr over 30 Minutes Intravenous  Once 10/15/20 1958 10/15/20 2146       REVIEW OF SYSTEMS:  Const: negative fever, positive chills, negative weight loss Eyes: negative diplopia or visual changes, negative eye pain ENT: negative coryza, negative sore throat Resp: Has cough, hemoptysis, has dyspnea Cards: negative for chest pain, palpitations, lower extremity edema GU: negative for frequency, dysuria and hematuria GI: Negative for abdominal pain, diarrhea, bleeding, constipation Skin: Hyperpigmented papules, itching Heme: negative for easy bruising and gum/nose bleeding MS: Generalized weakness. Low back and leg pain Neurolo:negative for headaches, positive dizziness,  vertigo, memory problems  Psych: negative for feelings of anxiety, depression  Endocrine:, diabetes Allergy/Immunology-sulfa causes itching.  Says she takes Benadryl with it Objective:  VITALS:  BP (!) 118/50   Pulse (!) 106   Temp 98.2 F (36.8 C) (Oral)   Resp 19   Ht 5' 4.5" (1.638 m)   Wt 126.1 kg   LMP 09/06/2016 (Approximate)   SpO2 (!) 71%   BMI 46.98 kg/m  PHYSICAL EXAM:  General: Alert, cooperative, no distress, appears stated age.  Increased BMI Head: Normocephalic, without obvious abnormality, atraumatic.  Eyes: Conjunctivae clear, anicteric sclerae. Pupils are equal ENT Nares normal. No drainage or sinus tenderness. Lips, mucosa, and tongue normal. No Thrush Neck: Supple, symmetrical, no adenopathy, thyroid: non tender no carotid bruit and no JVD. Back: No CVA tenderness. Lungs: Bilateral air entry. Heart: Tachycardia Abdomen. Did not examine .  Extremities: Right foot Fourth webspace there is an wound with foul-smelling discharge.  Fifth toe is necrotic     Left arm AV fistula skin: Hyperpigmented papules over the extremities Lymph: Cervical, supraclavicular normal. Neurologic: Grossly non-focal Pertinent Labs Lab Results CBC    Component Value Date/Time   WBC 20.8 (H) 10/17/2020 0352   RBC 3.09 (L) 10/17/2020 0352   HGB 9.3 (L) 10/17/2020 0352   HGB 12.4 01/11/2014 1324   HCT 29.4 (L) 10/17/2020 0352   HCT 39.2 01/11/2014 1324   PLT 314 10/17/2020 0352   PLT 370 01/11/2014 1324   MCV 95.1 10/17/2020 0352   MCV 86 01/11/2014 1324   MCH 30.1 10/17/2020 0352   MCHC 31.6 10/17/2020 0352   RDW 13.4 10/17/2020 0352   RDW 15.1 (H) 01/11/2014 1324   LYMPHSABS 1.1 10/15/2020 1958   LYMPHSABS 1.8 01/11/2014 1324   MONOABS 1.5 (H) 10/15/2020 1958   MONOABS 0.5 01/11/2014 1324   EOSABS 0.1 10/15/2020 1958   EOSABS 0.2 01/11/2014 1324   BASOSABS 0.1 10/15/2020 1958   BASOSABS 0.1 01/11/2014 1324    CMP Latest Ref Rng & Units 10/17/2020 10/16/2020  10/16/2020  Glucose 70 - 99 mg/dL 194(H) 295(H) 217(H)  BUN 6 - 20 mg/dL 76(H) 60(H) 55(H)  Creatinine 0.44 - 1.00 mg/dL 10.68(H) 9.75(H) 9.18(H)  Sodium 135 - 145 mmol/L 128(L) 128(L) 132(L)  Potassium 3.5 - 5.1 mmol/L 5.0 5.0 4.5  Chloride 98 - 111 mmol/L 91(L) 90(L) 94(L)  CO2 22 - 32 mmol/L 21(L) 19(L) 24  Calcium 8.9 - 10.3 mg/dL 9.4 9.1 9.1  Total Protein 6.5 - 8.1 g/dL - - 7.4  Total Bilirubin 0.3 - 1.2 mg/dL - - 0.9  Alkaline Phos 38 - 126 U/L - - 474(H)  AST 15 - 41 U/L - - 45(H)  ALT 0 - 44 U/L - - 47(H)      Microbiology: Recent Results (from the past 240 hour(s))  Blood Culture (routine x 2)     Status: None (Preliminary result)   Collection Time: 10/15/20  8:03 PM   Specimen: BLOOD  Result Value Ref Range Status   Specimen Description BLOOD BLOOD RIGHT FOREARM  Final   Special Requests   Final    BOTTLES DRAWN AEROBIC AND ANAEROBIC Blood Culture adequate volume   Culture   Final    NO GROWTH 2 DAYS Performed at [facility], [address], [place], [place] [address]    Report Status PENDING  Incomplete  Resp Panel by RT-PCR (Flu A&B, Covid) Nasopharyngeal Swab     Status: None   Collection Time: 10/15/20  8:26 PM   Specimen: Nasopharyngeal Swab; Nasopharyngeal(NP) swabs in vial transport medium  Result Value Ref Range Status   SARS Coronavirus 2 by RT PCR NEGATIVE NEGATIVE Final    Comment: (NOTE) SARS-CoV-2 target nucleic acids are NOT DETECTED.  The SARS-CoV-2 RNA is generally detectable in upper respiratory specimens during the acute phase of infection. The lowest concentration of SARS-CoV-2 viral copies this assay can detect is 138 copies/mL. A negative result does not preclude SARS-Cov-2 infection and should not be used as the sole basis for treatment or other patient management decisions. A  negative result may occur with  improper specimen collection/handling, submission of specimen other than nasopharyngeal swab, presence of viral  mutation(s) within the areas targeted by this assay, and inadequate number of viral copies(<138 copies/mL). A negative result must be combined with clinical observations, patient history, and epidemiological information. The expected result is Negative.  Fact Sheet for Patients:  EntrepreneurPulse.com.au  Fact Sheet for Healthcare Providers:  IncredibleEmployment.be  This test is no t yet approved or cleared by the [place] FDA and  has been authorized for detection and/or diagnosis of SARS-CoV-2 by FDA under an Emergency Use Authorization (EUA). This EUA will remain  in effect (meaning this test can be used) for the duration of the COVID-19 declaration under Section 564(b)(1) of the Act, 21 U.S.C.section 360bbb-3(b)(1), unless the authorization is terminated  or revoked sooner.       Influenza A by PCR NEGATIVE NEGATIVE Final   Influenza B by PCR NEGATIVE NEGATIVE Final    Comment: (NOTE) The Xpert Xpress SARS-CoV-2/FLU/RSV plus assay is intended as an aid in the diagnosis of influenza from Nasopharyngeal swab specimens and should not be used as a sole basis for treatment. Nasal washings and aspirates are unacceptable for Xpert Xpress SARS-CoV-2/FLU/RSV testing.  Fact Sheet for Patients: EntrepreneurPulse.com.au  Fact Sheet for Healthcare Providers: IncredibleEmployment.be  This test is not yet approved or cleared by the [place] FDA and has been authorized for detection and/or diagnosis of SARS-CoV-2 by FDA under an Emergency Use Authorization (EUA). This EUA will remain in effect (meaning this test can be used) for the duration of the COVID-19 declaration under Section 564(b)(1) of the Act, 21 U.S.C. section 360bbb-3(b)(1), unless the authorization is terminated or revoked.  Performed at [facility], [address] [place], [place] [address]   Blood Culture (routine x 2)      Status: None (Preliminary result)   Collection Time: 10/15/20  8:26 PM   Specimen: BLOOD  Result Value Ref Range Status   Specimen Description BLOOD RIGHT ANTECUBITAL  Final   Special Requests   Final    BOTTLES DRAWN AEROBIC AND ANAEROBIC Blood Culture adequate volume   Culture   Final    NO GROWTH 2 DAYS Performed at [facility], [address] [place], [place] [address]    Report Status PENDING  Incomplete    IMAGING RESULTS: I have personally reviewed the films ? Impression/Recommendation Diabetic foot infection Right fifth toe infection with webspace infection Sent culture Currently on Vanco and cefepime. Awaiting angio Seen by podiatrist She Will need surgery as toe gangrenous and antibiotics alone not enough  Diabetes mellitus on insulin  End-stage renal disease on dialysis through the AV fistula Hyperlipidemia on atorvastatin. ? ? ___________________________________________________ Discussed with patient, requesting provider Note:  This document was prepared using Dragon voice recognition software and may include unintentional dictation errors.

## 2020-10-17 NOTE — Progress Notes (Signed)
PT Cancellation Note  Patient Details Name: [name] MRN: [id number] DOB: 1971-01-12   Cancelled Treatment:    Reason Eval/Treat Not Completed: Patient at procedure or test/unavailable, will attempt to see pt at a future date/time as medically appropriate.     Linus Salmons PT, DPT 10/17/20, 1:09 PM

## 2020-10-18 ENCOUNTER — Encounter
Admission: EM | Disposition: A | Discharge status: Home / Self Care | Payer: Self-pay | Source: Home / Self Care | Attending: Internal Medicine

## 2020-10-18 ENCOUNTER — Encounter: Payer: Self-pay | Admitting: Vascular Surgery

## 2020-10-18 DIAGNOSIS — N186 End stage renal disease: Secondary | ICD-10-CM | POA: Diagnosis not present

## 2020-10-18 DIAGNOSIS — L97519 Non-pressure chronic ulcer of other part of right foot with unspecified severity: Secondary | ICD-10-CM

## 2020-10-18 DIAGNOSIS — E11628 Type 2 diabetes mellitus with other skin complications: Secondary | ICD-10-CM | POA: Diagnosis not present

## 2020-10-18 DIAGNOSIS — E11621 Type 2 diabetes mellitus with foot ulcer: Secondary | ICD-10-CM

## 2020-10-18 DIAGNOSIS — L089 Local infection of the skin and subcutaneous tissue, unspecified: Secondary | ICD-10-CM | POA: Diagnosis not present

## 2020-10-18 DIAGNOSIS — I70235 Atherosclerosis of native arteries of right leg with ulceration of other part of foot: Secondary | ICD-10-CM

## 2020-10-18 HISTORY — PX: LOWER EXTREMITY ANGIOGRAPHY: CATH118251

## 2020-10-18 LAB — BASIC METABOLIC PANEL
Anion gap: 16 — ABNORMAL HIGH (ref 5–15)
BUN: 48 mg/dL — ABNORMAL HIGH (ref 6–20)
CO2: 25 mmol/L (ref 22–32)
Calcium: 8.7 mg/dL — ABNORMAL LOW (ref 8.9–10.3)
Chloride: 89 mmol/L — ABNORMAL LOW (ref 98–111)
Creatinine, Ser: 8.03 mg/dL — ABNORMAL HIGH (ref 0.44–1.00)
GFR, Estimated: 6 mL/min — ABNORMAL LOW (ref >60)
Glucose, Bld: 180 mg/dL — ABNORMAL HIGH (ref 70–99)
Potassium: 4.2 mmol/L (ref 3.5–5.1)
Sodium: 130 mmol/L — ABNORMAL LOW (ref 135–145)

## 2020-10-18 LAB — GLUCOSE, CAPILLARY
Glucose-Capillary: 161 mg/dL — ABNORMAL HIGH (ref 70–99)
Glucose-Capillary: 156 mg/dL — ABNORMAL HIGH (ref 70–99)
Glucose-Capillary: 185 mg/dL — ABNORMAL HIGH (ref 70–99)
Glucose-Capillary: 184 mg/dL — ABNORMAL HIGH (ref 70–99)
Glucose-Capillary: 242 mg/dL — ABNORMAL HIGH (ref 70–99)
Glucose-Capillary: 238 mg/dL — ABNORMAL HIGH (ref 70–99)

## 2020-10-18 LAB — CBC
HCT: 26.6 % — ABNORMAL LOW (ref 36.0–46.0)
Hemoglobin: 8.6 g/dL — ABNORMAL LOW (ref 12.0–15.0)
MCH: 30.4 pg (ref 26.0–34.0)
MCHC: 32.3 g/dL (ref 30.0–36.0)
MCV: 94 fL (ref 80.0–100.0)
Platelets: 328 10*3/uL (ref 150–400)
RBC: 2.83 MIL/uL — ABNORMAL LOW (ref 3.87–5.11)
RDW: 13.5 % (ref 11.5–15.5)
WBC: 20.7 10*3/uL — ABNORMAL HIGH (ref 4.0–10.5)
nRBC: 0 % (ref 0.0–0.2)

## 2020-10-18 LAB — MAGNESIUM: Magnesium: 1.8 mg/dL (ref 1.7–2.4)

## 2020-10-18 SURGERY — LOWER EXTREMITY ANGIOGRAPHY
Anesthesia: Moderate Sedation | Laterality: Right

## 2020-10-18 MED ORDER — MIDAZOLAM HCL 2 MG/ML PO SYRP
ORAL_SOLUTION | ORAL | Status: AC
  Filled 2020-10-18: qty 4

## 2020-10-18 MED ORDER — MIDAZOLAM HCL 2 MG/ML PO SYRP
8.0000 mg | ORAL_SOLUTION | Freq: Once | ORAL | Status: AC | PRN
  Administered 2020-10-18: 8 mg via ORAL

## 2020-10-18 MED ORDER — MIDAZOLAM HCL 2 MG/2ML IJ SOLN
INTRAMUSCULAR | Status: DC | PRN
  Administered 2020-10-18 (×2): 0.5 mg via INTRAVENOUS

## 2020-10-18 MED ORDER — HEPARIN SODIUM (PORCINE) 1000 UNIT/ML IJ SOLN
INTRAMUSCULAR | Status: AC
  Filled 2020-10-18: qty 1

## 2020-10-18 MED ORDER — MIDAZOLAM HCL 5 MG/5ML IJ SOLN
INTRAMUSCULAR | Status: AC
  Filled 2020-10-18: qty 5

## 2020-10-18 MED ORDER — HYDROMORPHONE HCL 1 MG/ML IJ SOLN
1.0000 mg | Freq: Once | INTRAMUSCULAR | Status: AC | PRN
  Administered 2020-10-21: 1 mg via INTRAVENOUS
  Filled 2020-10-18: qty 1

## 2020-10-18 MED ORDER — FENTANYL CITRATE PF 50 MCG/ML IJ SOSY
PREFILLED_SYRINGE | INTRAMUSCULAR | Status: AC
  Filled 2020-10-18: qty 2

## 2020-10-18 MED ORDER — FAMOTIDINE 20 MG PO TABS: 40.0000 mg | ORAL_TABLET | Freq: Once | ORAL | Status: DC | PRN

## 2020-10-18 MED ORDER — FENTANYL CITRATE (PF) 100 MCG/2ML IJ SOLN
INTRAMUSCULAR | Status: DC | PRN
  Administered 2020-10-18 (×2): 12.5 ug via INTRAVENOUS

## 2020-10-18 MED ORDER — SODIUM CHLORIDE 0.9 % IV SOLN
INTRAVENOUS | Status: DC
Start: 2020-10-18 — End: 2020-10-18

## 2020-10-18 MED ORDER — DIPHENHYDRAMINE HCL 50 MG/ML IJ SOLN: 50.0000 mg | Freq: Once | INTRAMUSCULAR | Status: DC | PRN

## 2020-10-18 MED ORDER — METHYLPREDNISOLONE SODIUM SUCC 125 MG IJ SOLR: 125.0000 mg | Freq: Once | INTRAMUSCULAR | Status: DC | PRN

## 2020-10-18 MED ORDER — CEFAZOLIN SODIUM-DEXTROSE 1-4 GM/50ML-% IV SOLN: 1.0000 g | Freq: Once | INTRAVENOUS | Status: AC

## 2020-10-18 MED ORDER — CEFAZOLIN SODIUM-DEXTROSE 1-4 GM/50ML-% IV SOLN
INTRAVENOUS | Status: AC
  Administered 2020-10-18: 1 g via INTRAVENOUS
  Filled 2020-10-18: qty 50

## 2020-10-18 MED ORDER — IODIXANOL 320 MG/ML IV SOLN
INTRAVENOUS | Status: DC | PRN
Start: 2020-10-18 — End: 2020-10-18
  Administered 2020-10-18: 30 mL via INTRA_ARTERIAL

## 2020-10-18 MED ORDER — ONDANSETRON HCL 4 MG/2ML IJ SOLN: 4.0000 mg | Freq: Four times a day (QID) | INTRAMUSCULAR | Status: DC | PRN

## 2020-10-18 SURGICAL SUPPLY — 9 items
CATH ANGIO 5F PIGTAIL 65CM (CATHETERS) ×1 IMPLANT
COVER PROBE U/S 5X48 (MISCELLANEOUS) ×1 IMPLANT
DEVICE STARCLOSE SE CLOSURE (Vascular Products) ×1 IMPLANT
GLIDEWIRE ADV .035X260CM (WIRE) ×1 IMPLANT
PACK ANGIOGRAPHY (CUSTOM PROCEDURE TRAY) ×2 IMPLANT
SHEATH BRITE TIP 5FRX11 (SHEATH) ×1 IMPLANT
SYR MEDRAD MARK 7 150ML (SYRINGE) ×1 IMPLANT
TUBING CONTRAST HIGH PRESS 72 (TUBING) ×1 IMPLANT
WIRE GUIDERIGHT .035X150 (WIRE) ×1 IMPLANT

## 2020-10-18 NOTE — Progress Notes (Signed)
Dr. Lucky Cowboy at bedside, speaking with pt. And her spouse Fritz Pickerel. Both verbalize understanding of conversation with MD.

## 2020-10-18 NOTE — Interval H&P Note (Signed)
History and Physical Interval Note:  10/18/2020 9:51 AM  [name]  has presented today for surgery, with the diagnosis of diabetic foot ulcer.  The various methods of treatment have been discussed with the patient and family. After consideration of risks, benefits and other options for treatment, the patient has consented to  Procedure(s): Lower Extremity Angiography (Right) as a surgical intervention.  The patient's history has been reviewed, patient examined, no change in status, stable for surgery.  I have reviewed the patient's chart and labs.  Questions were answered to the patient's satisfaction.     [name]

## 2020-10-18 NOTE — Progress Notes (Signed)
OT Cancellation Note  Patient Details Name: [name] MRN: [id number] DOB: December 10, 1970   Cancelled Treatment:    Reason Eval/Treat Not Completed: Patient at procedure or test/ unavailable Pt OTF in recovery room after vascular procedure at this time. Continue at transfer order in place. Will f/u for OT evaluation at later date/time once pt back to the floor. Thank you.   Gerrianne Scale, [place], OTR/L ascom [phone] 10/18/20, 11:30 AM

## 2020-10-18 NOTE — Progress Notes (Signed)
Date of Admission:  10/15/2020    ID: [name] is a 50 y.o. female  Principal Problem:   Diabetic foot ulcer ([place]) Active Problems:   Diabetes ([place])   Essential hypertension   DNR (do not resuscitate) discussion   End-stage renal disease on hemodialysis ([place])   Chronic respiratory failure with hypoxia and hypercapnia (HCC)   Decreased pedal pulses  Rt lower extremity-Normal common femoral artery, profunda femoris artery, and superficial femoral artery.  The popliteal artery had a mild lesion of 30% or less just below the knee.  There was a normal tibial trifurcation with three-vessel runoff with both the anterior tibial and posterior tibial arteries being continuous to the foot without focal stenosis.  Subjective: Sitting in bed No complaints No fever   Medications:   aspirin EC  81 mg Oral Daily   atorvastatin  80 mg Oral q1800   Chlorhexidine Gluconate Cloth  6 each Topical Daily   cholecalciferol  4,000 Units Oral Weekly   cinacalcet  60 mg Oral Daily   docusate sodium  100 mg Oral Daily   fentaNYL       ferric citrate  210 mg Oral TID WC   heparin  5,000 Units Subcutaneous Q8H   insulin aspart  0-5 Units Subcutaneous QHS   insulin aspart  0-9 Units Subcutaneous TID WC   insulin aspart  2 Units Subcutaneous TID WC   insulin glargine-yfgn  36 Units Subcutaneous QHS   linaclotide  290 mcg Oral q morning   midazolam       midazolam       mometasone-formoterol  2 puff Inhalation BID   pantoprazole  40 mg Oral Daily   sodium chloride flush  3 mL Intravenous Q12H    Objective: Vital signs in last 24 hours: Temp:  [98.2 F (36.8 C)-100.3 F (37.9 C)] 99.5 F (37.5 C) (08/24 1157) Pulse Rate:  [83-118] 103 (08/24 1157) Resp:  [18-29] 20 (08/24 1157) BP: (90-130)/(51-77) 101/57 (08/24 1157) SpO2:  [89 %-100 %] 95 % (08/24 1233) Weight:  [126.1 kg] 126.1 kg (08/24 0928)  PHYSICAL EXAM:  General: Alert, cooperative, no distress, appears stated age.  Lungs:  Clear to auscultation bilaterally. No Wheezing or Rhonchi. No rales. Heart: Regular rate and rhythm, no murmur, rub or gallop. Extremities: Right foot Fourth webspace infection along with ischemic necrosis of the fifth toe on the right side Skin: Hyperpigmented papules on t both lower extremity. Lymph: Cervical, supraclavicular normal. Neurologic: Grossly non-focal  Lab Results Recent Labs    10/17/20 0352 10/18/20 0602  WBC 20.8* 20.7*  HGB 9.3* 8.6*  HCT 29.4* 26.6*  NA 128* 130*  K 5.0 4.2  CL 91* 89*  CO2 21* 25  BUN 76* 48*  CREATININE 10.68* 8.03*   Liver Panel Recent Labs    10/15/20 1958 10/16/20 0347  PROT 8.3* 7.4  ALBUMIN 3.3* 2.7*  AST 64* 45*  ALT 59* 47*  ALKPHOS 559* 474*  BILITOT 0.7 0.9   Sedimentation Rate No results for input(s): ESRSEDRATE in the last 72 hours. C-Reactive Protein No results for input(s): CRP in the last 72 hours.  Microbiology:  Studies/Results: MR FOOT RIGHT WO CONTRAST  Result Date: 10/17/2020 CLINICAL DATA:  History of diabetes and end-stage renal disease. Soft tissue wound of the lateral forefoot. EXAM: MRI OF THE RIGHT FOREFOOT WITHOUT CONTRAST TECHNIQUE: Multiplanar, multisequence MR imaging of the right foot was performed. No intravenous contrast was administered. COMPARISON:  None. FINDINGS: Bones/Joint/Cartilage Soft tissue  wound of the fifth digit along the dorsal aspect. Bone marrow edema in the fifth metatarsal head and fifth proximal phalanx concerning for osteomyelitis. No acute fracture or dislocation. Normal alignment. No joint effusion. Mild osteoarthritis of the first MTP joint. Ligaments Collateral ligaments are intact. Lisfranc ligament is intact. Muscles and Tendons Flexor, peroneal and extensor compartment tendons are intact. There are small foci of low signal within the plantar musculature concerning for soft tissue air as can be seen with necrotizing infection. Mild edema within the plantar musculature. Soft  tissue No fluid collection or hematoma. No soft tissue mass. Soft tissue edema throughout the dorsal aspect of the foot which may be reactive versus secondary to cellulitis. IMPRESSION: 1. Soft tissue wound of the fifth digit along the dorsal aspect. Bone marrow edema in the fifth metatarsal head and fifth proximal phalanx concerning for osteomyelitis. 2. There are small foci of low signal within the plantar musculature concerning for air as can be seen with necrotizing infection. 3. Soft tissue edema throughout the dorsal aspect of the foot which may be reactive versus secondary to cellulitis. Electronically Signed   By: Kathreen Devoid M.D.   On: 10/17/2020 06:23   PERIPHERAL VASCULAR CATHETERIZATION  Result Date: 10/18/2020 See surgical note for result.    Assessment/Plan: Diabetic foot infection.  Right fifth toe infection with webspace infection.  Culture has been sent.  Currently on Vanco and cefepime.  Angio was done and did not show any blockage She is going to need surgery as antibiotics alone will not treat this.  Diabetes mellitus on insulin  End-stage renal disease on dialysis through AV fistula  Hyperlipidemia on atorvastatin. Discussed the management with the patient and care team.

## 2020-10-18 NOTE — Progress Notes (Signed)
[facility]  ROUNDING NOTE   Subjective:   [name] was admitted to [facility] on 10/15/2020 for Gangrene [facility]) [I96] Diabetic foot ulcer ([place]) [E11.621, L97.509] Decreased pedal pulses [R09.89] Sepsis, due to unspecified organism, unspecified whether acute organ dysfunction present [facility]) [A41.9]  Last hemodialysis treatment was Saturday, 8/20. She was having hypotension on her treatment and she took midodrine to complete her treatment.   Patient seen resting in bed in preop Alert and oriented No complaints at this time Patient seen later back in inpatient room after procedure Drowsy, requesting bathroom assistance and respiratory Husband at bedside, states he was told there was good blood flow   Objective:  Vital signs in last 24 hours:  Temp:  [98.2 F (36.8 C)-100.3 F (37.9 C)] 99.5 F (37.5 C) (08/24 1157) Pulse Rate:  [83-118] 103 (08/24 1157) Resp:  [18-29] 20 (08/24 1157) BP: (90-130)/(51-77) 101/57 (08/24 1157) SpO2:  [89 %-100 %] 95 % (08/24 1233) Weight:  [126.1 kg] 126.1 kg (08/24 0928)  Weight change:  Filed Weights   10/15/20 1905 10/18/20 0928  Weight: 126.1 kg 126.1 kg    Intake/Output: I/O last 3 completed shifts: In: 460.2 [I.V.:60.2; IV Piggyback:400] Out: 2000 [Other:2000]   Intake/Output this shift:  No intake/output data recorded.  Physical Exam: General: NAD, sitting up in bed  Head: Normocephalic, atraumatic. Moist oral mucosal membranes  Eyes: Anicteric  Lungs:  Clear to auscultation, O2 2L  Heart: Regular rate and rhythm  Abdomen:  Soft, nontender  Extremities:  trace peripheral edema. Right foot with ulcer under fifth toe  Neurologic: Nonfocal, moving all four extremities  Skin: No lesions  Access: Left AVF    Basic Metabolic Panel: Recent Labs  Lab 10/15/20 1958 10/16/20 0347 10/16/20 1402 10/17/20 0352 10/18/20 0602  NA 130* 132* 128* 128* 130*  K 4.7 4.5 5.0 5.0 4.2  CL 92* 94* 90* 91* 89*  CO2 23 24  19* 21* 25  GLUCOSE 241* 217* 295* 194* 180*  BUN 52* 55* 60* 76* 48*  CREATININE 8.88* 9.18* 9.75* 10.68* 8.03*  CALCIUM 9.3 9.1 9.1 9.4 8.7*  MG  --   --   --  2.0 1.8     Liver Function Tests: Recent Labs  Lab 10/15/20 1958 10/16/20 0347  AST 64* 45*  ALT 59* 47*  ALKPHOS 559* 474*  BILITOT 0.7 0.9  PROT 8.3* 7.4  ALBUMIN 3.3* 2.7*    No results for input(s): LIPASE, AMYLASE in the last 168 hours. No results for input(s): AMMONIA in the last 168 hours.  CBC: Recent Labs  Lab 10/15/20 1958 10/16/20 1402 10/17/20 0352 10/18/20 0602  WBC 22.1* 21.4* 20.8* 20.7*  NEUTROABS 19.0*  --   --   --   HGB 9.8* 8.9* 9.3* 8.6*  HCT 30.6* 28.5* 29.4* 26.6*  MCV 96.5 97.6 95.1 94.0  PLT 358 325 314 328     Cardiac Enzymes: No results for input(s): CKTOTAL, CKMB, CKMBINDEX, TROPONINI in the last 168 hours.  BNP: Invalid input(s): POCBNP  CBG: Recent Labs  Lab 10/17/20 2021 10/18/20 0751 10/18/20 0920 10/18/20 1101 10/18/20 1200  GLUCAP 168* 161* 156* 185* 184*     Microbiology: Results for orders placed or performed during the hospital encounter of 10/15/20  Blood Culture (routine x 2)     Status: None (Preliminary result)   Collection Time: 10/15/20  8:03 PM   Specimen: BLOOD  Result Value Ref Range Status   Specimen Description BLOOD BLOOD RIGHT FOREARM  Final   Special Requests   Final    BOTTLES DRAWN AEROBIC AND ANAEROBIC Blood Culture adequate volume   Culture   Final    NO GROWTH 3 DAYS Performed at [facility], [address] [place], [place] [address]    Report Status PENDING  Incomplete  Resp Panel by RT-PCR (Flu A&B, Covid) Nasopharyngeal Swab     Status: None   Collection Time: 10/15/20  8:26 PM   Specimen: Nasopharyngeal Swab; Nasopharyngeal(NP) swabs in vial transport medium  Result Value Ref Range Status   SARS Coronavirus 2 by RT PCR NEGATIVE NEGATIVE Final    Comment: (NOTE) SARS-CoV-2 target nucleic acids are NOT  DETECTED.  The SARS-CoV-2 RNA is generally detectable in upper respiratory specimens during the acute phase of infection. The lowest concentration of SARS-CoV-2 viral copies this assay can detect is 138 copies/mL. A negative result does not preclude SARS-Cov-2 infection and should not be used as the sole basis for treatment or other patient management decisions. A negative result may occur with  improper specimen collection/handling, submission of specimen other than nasopharyngeal swab, presence of viral mutation(s) within the areas targeted by this assay, and inadequate number of viral copies(<138 copies/mL). A negative result must be combined with clinical observations, patient history, and epidemiological information. The expected result is Negative.  Fact Sheet for Patients:  EntrepreneurPulse.com.au  Fact Sheet for Healthcare Providers:  IncredibleEmployment.be  This test is no t yet approved or cleared by the [place] FDA and  has been authorized for detection and/or diagnosis of SARS-CoV-2 by FDA under an Emergency Use Authorization (EUA). This EUA will remain  in effect (meaning this test can be used) for the duration of the COVID-19 declaration under Section 564(b)(1) of the Act, 21 U.S.C.section 360bbb-3(b)(1), unless the authorization is terminated  or revoked sooner.       Influenza A by PCR NEGATIVE NEGATIVE Final   Influenza B by PCR NEGATIVE NEGATIVE Final    Comment: (NOTE) The Xpert Xpress SARS-CoV-2/FLU/RSV plus assay is intended as an aid in the diagnosis of influenza from Nasopharyngeal swab specimens and should not be used as a sole basis for treatment. Nasal washings and aspirates are unacceptable for Xpert Xpress SARS-CoV-2/FLU/RSV testing.  Fact Sheet for Patients: EntrepreneurPulse.com.au  Fact Sheet for Healthcare Providers: IncredibleEmployment.be  This test is not yet  approved or cleared by the [place] FDA and has been authorized for detection and/or diagnosis of SARS-CoV-2 by FDA under an Emergency Use Authorization (EUA). This EUA will remain in effect (meaning this test can be used) for the duration of the COVID-19 declaration under Section 564(b)(1) of the Act, 21 U.S.C. section 360bbb-3(b)(1), unless the authorization is terminated or revoked.  Performed at [facility], [address] [place], [place] [address]   Blood Culture (routine x 2)     Status: None (Preliminary result)   Collection Time: 10/15/20  8:26 PM   Specimen: BLOOD  Result Value Ref Range Status   Specimen Description BLOOD RIGHT ANTECUBITAL  Final   Special Requests   Final    BOTTLES DRAWN AEROBIC AND ANAEROBIC Blood Culture adequate volume   Culture   Final    NO GROWTH 3 DAYS Performed at [facility], [address], [place], [place] [address]    Report Status PENDING  Incomplete  Aerobic Culture w Gram Stain (superficial specimen)     Status: None (Preliminary result)   Collection Time: 10/17/20  8:52 PM   Specimen: Wound  Result Value Ref Range Status   Specimen Description   Final    WOUND Performed at [facility], [address], [place], [place] [address]    Special Requests   Final    NONE Performed at [facility], [place], [place] [address]    Gram Stain   Final    RARE SQUAMOUS EPITHELIAL CELLS PRESENT FEW WBC PRESENT, PREDOMINANTLY MONONUCLEAR ABUNDANT GRAM VARIABLE ROD FEW GRAM POSITIVE COCCI    Culture   Final    TOO YOUNG TO READ Performed at [facility], [address] [place], [place] [address]    Report Status PENDING  Incomplete    Coagulation Studies: Recent Labs    10/15/20 1958  LABPROT 14.9  INR 1.2     Urinalysis: No results for input(s): COLORURINE, LABSPEC, PHURINE, GLUCOSEU, HGBUR, BILIRUBINUR, KETONESUR, PROTEINUR, UROBILINOGEN, NITRITE,  LEUKOCYTESUR in the last 72 hours.  Invalid input(s): APPERANCEUR    Imaging: MR FOOT RIGHT WO CONTRAST  Result Date: 10/17/2020 CLINICAL DATA:  History of diabetes and end-stage renal disease. Soft tissue wound of the lateral forefoot. EXAM: MRI OF THE RIGHT FOREFOOT WITHOUT CONTRAST TECHNIQUE: Multiplanar, multisequence MR imaging of the right foot was performed. No intravenous contrast was administered. COMPARISON:  None. FINDINGS: Bones/Joint/Cartilage Soft tissue wound of the fifth digit along the dorsal aspect. Bone marrow edema in the fifth metatarsal head and fifth proximal phalanx concerning for osteomyelitis. No acute fracture or dislocation. Normal alignment. No joint effusion. Mild osteoarthritis of the first MTP joint. Ligaments Collateral ligaments are intact. Lisfranc ligament is intact. Muscles and Tendons Flexor, peroneal and extensor compartment tendons are intact. There are small foci of low signal within the plantar musculature concerning for soft tissue air as can be seen with necrotizing infection. Mild edema within the plantar musculature. Soft tissue No fluid collection or hematoma. No soft tissue mass. Soft tissue edema throughout the dorsal aspect of the foot which may be reactive versus secondary to cellulitis. IMPRESSION: 1. Soft tissue wound of the fifth digit along the dorsal aspect. Bone marrow edema in the fifth metatarsal head and fifth proximal phalanx concerning for osteomyelitis. 2. There are small foci of low signal within the plantar musculature concerning for air as can be seen with necrotizing infection. 3. Soft tissue edema throughout the dorsal aspect of the foot which may be reactive versus secondary to cellulitis. Electronically Signed   By: Kathreen Devoid M.D.   On: 10/17/2020 06:23   PERIPHERAL VASCULAR CATHETERIZATION  Result Date: 10/18/2020 See surgical note for result.    Medications:    sodium chloride Stopped (10/16/20 1111)   ceFEPime (MAXIPIME) IV  Stopped (10/17/20 2137)   vancomycin Stopped (10/17/20 1716)    aspirin EC  81 mg Oral Daily   atorvastatin  80 mg Oral q1800   Chlorhexidine Gluconate Cloth  6 each Topical Daily   cholecalciferol  4,000 Units Oral Weekly   cinacalcet  60 mg Oral Daily   docusate sodium  100 mg Oral Daily   fentaNYL       ferric citrate  210 mg Oral TID WC   heparin  5,000 Units Subcutaneous Q8H   insulin aspart  0-5 Units Subcutaneous QHS   insulin aspart  0-9 Units Subcutaneous TID WC   insulin aspart  2 Units Subcutaneous TID WC   insulin glargine-yfgn  36 Units Subcutaneous QHS   linaclotide  290 mcg Oral q morning   midazolam  midazolam       mometasone-formoterol  2 puff Inhalation BID   pantoprazole  40 mg Oral Daily   sodium chloride flush  3 mL Intravenous Q12H   sodium chloride, acetaminophen, albuterol, albuterol, alum & mag hydroxide-simeth, calcium carbonate, dextromethorphan-guaiFENesin, hydrALAZINE, HYDROmorphone (DILAUDID) injection, ipratropium-albuterol, lactulose, metoprolol tartrate, ondansetron (ZOFRAN) IV, ondansetron, oxyCODONE, polyethylene glycol, sodium chloride flush, traZODone  Assessment/ Plan:  [name] is a 50 y.o. black female with end stage renal disease on hemodialysis, diabetes mellitus type II, hypertension, congestive heart failure, COPD, who is admitted to [facility] on 10/15/2020 for Gangrene [facility]) [I96] Diabetic foot ulcer ([place]) [F79.024, L97.509] Decreased pedal pulses [R09.89] Sepsis, due to unspecified organism, unspecified whether acute organ dysfunction present [facility]) [A41.9]  [facility] Left AVF 127.5kg  End Stage Renal Disease: last dialysis was 10/14/20. Received dialysis yesterday, UF 2L removed. Tolerated well. Next treatment scheduled for Thursday  Hypotension: secondary to infection. Holding home regimen torsemide, losartan, clonidine. 101/57  Anemia with chronic kidney disease: hemoglobin 8.6. Low dose EPO with next HD  treatment.   Secondary Hyperparathyroidism with hyeprphosphatemia: there is risk for calciphylaxis. Outpatinet labs on 8/9: phos 10.6, calcium 8.2, PTH 1456.  - resume  cinacalcet and sevelamer with meals.   Diabetes mellitus type II with chronic kidney disease: insulin dependent. Complicated with diabetic foot ulcer. History of poor control. Hemoglobin A1c 9.2% on 09/19/2020.  - Appreciate podiatry and vascular input.  - empiric ceftriaxone and metronidazole.  - Vascular performed right lower angiogram today. 30% mild lesion found popliteal artery. Deemed adequate for wound healing.  - Podiatry recommending right fifth toe amputation, but awaiting angiogram today to determine treatment plan.      LOS: 3 Beatryce Colombo 8/24/202212:41 PM

## 2020-10-18 NOTE — Op Note (Signed)
[name]  Percutaneous Study/Intervention Procedural Note   Date of Surgery: 10/18/2020  Surgeon(s):Mauriana Dann    Assistants:none  Pre-operative Diagnosis: PAD with ulceration right foot  Post-operative diagnosis:  Same  Procedure(s) Performed:             1.  Ultrasound guidance for vascular access left femoral artery             2.  Catheter placement into right SFA from left femoral approach             3.  Aortogram and selective right lower extremity angiogram             4.  StarClose closure device left femoral artery  EBL: 5 cc  Contrast: 30 cc  Fluoro Time: 1.3 minutes  Moderate Conscious Sedation Time: approximately 28 minutes using 1 mg of Versed and 25 mcg of Fentanyl              Indications:  Patient is a 50 y.o.female with a nonhealing ulceration on the right foot. The patient is brought in for angiography for further evaluation and potential treatment.  Due to the limb threatening nature of the situation, angiogram was performed for attempted limb salvage. The patient is aware that if the procedure fails, amputation would be expected.  The patient also understands that even with successful revascularization, amputation may still be required due to the severity of the situation.  Risks and benefits are discussed and informed consent is obtained.   Procedure:  The patient was identified and appropriate procedural time out was performed.  The patient was then placed supine on the table and prepped and draped in the usual sterile fashion. Moderate conscious sedation was administered during a face to face encounter with the patient throughout the procedure with my supervision of the RN administering medicines and monitoring the patient's vital signs, pulse oximetry, telemetry and mental status throughout from the start of the procedure until the patient was taken to the recovery room. Ultrasound was used to evaluate the left common femoral artery.   It was patent .  A digital ultrasound image was acquired.  A Seldinger needle was used to access the left common femoral artery under direct ultrasound guidance and a permanent image was performed.  A 0.035 J wire was advanced without resistance and a 5Fr sheath was placed.  Pigtail catheter was placed into the aorta and an AP aortogram was performed. This demonstrated normal renal arteries and normal aorta and iliac segments without significant stenosis. I then crossed the aortic bifurcation and advanced to the right femoral head.  After the initial image of the femoral bifurcation, the pigtail catheter was advanced into the mid right SFA to help opacify distally.  Selective right lower extremity angiogram was then performed. This demonstrated a normal common femoral artery, profunda femoris artery, and superficial femoral artery.  The popliteal artery had a mild lesion of 30% or less just below the knee.  There was a normal tibial trifurcation with three-vessel runoff with both the anterior tibial and posterior tibial arteries being continuous to the foot without focal stenosis.  No intervention was required and she has adequate flow for wound healing. I elected to terminate the procedure. The sheath was removed and StarClose closure device was deployed in the left femoral artery with excellent hemostatic result. The patient was taken to the recovery room in stable condition having tolerated the procedure well.  Findings:  Aortogram: Normal aorta and iliac arteries without significant stenosis             Right lower Extremity: Normal common femoral artery, profunda femoris artery, and superficial femoral artery.  The popliteal artery had a mild lesion of 30% or less just below the knee.  There was a normal tibial trifurcation with three-vessel runoff with both the anterior tibial and posterior tibial arteries being continuous to the foot without focal stenosis.   Disposition: Patient was taken  to the recovery room in stable condition having tolerated the procedure well.  Complications: None  [name] 10/18/2020 10:49 AM   This note was created with Dragon Medical transcription system. Any errors in dictation are purely unintentional.

## 2020-10-18 NOTE — Progress Notes (Signed)
PT Cancellation Note  Patient Details Name: ZHANE DONLAN MRN: [id number] DOB: [birth year]   Cancelled Treatment:    Reason Eval/Treat Not Completed: Patient at procedure or test/unavailable, will attempt to see pt at a future date/time as medically appropriate.     Linus Salmons PT, DPT 10/18/20, 11:55 AM

## 2020-10-18 NOTE — Progress Notes (Signed)
Order Requisition for prayer. Patient had been taken for testing. I will follow up again, patient's family members present in the room.

## 2020-10-18 NOTE — Progress Notes (Signed)
PROGRESS NOTE    [name]  WUG:[id number] DOB: 1970-10-01 DOA: 10/15/2020 PCP: [name], NP   Assessment & Plan:   Principal Problem:   Diabetic foot ulcer ([place]) Active Problems:   Diabetes ([place])   Essential hypertension   DNR (do not resuscitate) discussion   End-stage renal disease on hemodialysis ([place])   Chronic respiratory failure with hypoxia and hypercapnia (HCC)   Decreased pedal pulses   Diabetic foot ulcer: with right foot/toe osteomyelitis. Continue on IV cefepime, vanco as per ID. Oxycodone prn for pain   Possible PVD: has decreased pedal pulses. ABI shows moderate occlusive disease bilateral lower extremity. S/p angiogram 8/24 which showed normal common femoral artery, profunda femoris artery, superficial femoral artery & popliteal artery had mild lesion of 30% or < just below the knee.   Leukocytosis: secondary to above infection. Continue on IV abxs   DM2: poorly controlled w/ HbA1c 8.9. Continue glargine, SSI w/ accuchecks  ESRD: on HD. Management as per nephro   ACD: likely secondary to ESRD. No need for a transfusion currently   Hyponatremia: will be managed w/ HD  Chronic hypoxia: continue on supplemental oxygen at 3L [place]   Morbid obesity: BMI 46.9. Complicates overall care & prognosis     DVT prophylaxis: heparin  Code Status: full  Family Communication: discussed pt's care w/ pt's family at bedside and answered their questions  Disposition Plan: unclear, likely d/c back home   Level of care: Med-Surg  Status is: Inpatient  Remains inpatient appropriate because:Ongoing diagnostic testing needed not appropriate for outpatient work up, IV treatments appropriate due to intensity of illness or inability to take PO, and Inpatient level of care appropriate due to severity of illness  Dispo: The patient is from: Home              Anticipated d/c is to: Home              Patient currently is not medically stable to d/c.   Difficult to  place patient : unclear     Consultants:  Vascular surg ID  Procedures:   Antimicrobials: vanco, cefepime    Subjective: Pt c/o malaise  Objective: Vitals:   10/17/20 2022 10/17/20 2343 10/18/20 0152 10/18/20 0750  BP:  (!) 99/53 (!) 129/54 113/76  Pulse:  (!) 118 83 (!) 102  Resp:  20 19 18   Temp:  100 F (37.8 C) 100 F (37.8 C) 99.4 F (37.4 C)  TempSrc:    Oral  SpO2: 95% 100% 94% 100%  Weight:      Height:        Intake/Output Summary (Last 24 hours) at 10/18/2020 8882 Last data filed at 10/18/2020 0500 Gross per 24 hour  Intake 300 ml  Output 2000 ml  Net -1700 ml   Filed Weights   10/15/20 1905  Weight: 126.1 kg    Examination:  General exam: Appears lethargic but comfortable  Respiratory system: diminished breath sounds b/l Cardiovascular system: S1 & S2 +. No rubs, gallops or clicks.  Gastrointestinal system: Abdomen is obese, soft and nontender. Normal bowel sounds heard. Central nervous system: Lethargic but moves all extremities  Psychiatry: Judgement and insight appear normal. Flat mood and affect.     Data Reviewed: I have personally reviewed following labs and imaging studies  CBC: Recent Labs  Lab 10/15/20 1958 10/16/20 1402 10/17/20 0352 10/18/20 0602  WBC 22.1* 21.4* 20.8* 20.7*  NEUTROABS 19.0*  --   --   --  HGB 9.8* 8.9* 9.3* 8.6*  HCT 30.6* 28.5* 29.4* 26.6*  MCV 96.5 97.6 95.1 94.0  PLT 358 325 314 510   Basic Metabolic Panel: Recent Labs  Lab 10/15/20 1958 10/16/20 0347 10/16/20 1402 10/17/20 0352 10/18/20 0602  NA 130* 132* 128* 128* 130*  K 4.7 4.5 5.0 5.0 4.2  CL 92* 94* 90* 91* 89*  CO2 23 24 19* 21* 25  GLUCOSE 241* 217* 295* 194* 180*  BUN 52* 55* 60* 76* 48*  CREATININE 8.88* 9.18* 9.75* 10.68* 8.03*  CALCIUM 9.3 9.1 9.1 9.4 8.7*  MG  --   --   --  2.0 1.8   GFR: Estimated Creatinine Clearance: 11.2 mL/min (A) (by C-G formula based on SCr of 8.03 mg/dL (H)). Liver Function Tests: Recent Labs   Lab 10/15/20 1958 10/16/20 0347  AST 64* 45*  ALT 59* 47*  ALKPHOS 559* 474*  BILITOT 0.7 0.9  PROT 8.3* 7.4  ALBUMIN 3.3* 2.7*   No results for input(s): LIPASE, AMYLASE in the last 168 hours. No results for input(s): AMMONIA in the last 168 hours. Coagulation Profile: Recent Labs  Lab 10/15/20 1958  INR 1.2   Cardiac Enzymes: No results for input(s): CKTOTAL, CKMB, CKMBINDEX, TROPONINI in the last 168 hours. BNP (last 3 results) No results for input(s): PROBNP in the last 8760 hours. HbA1C: No results for input(s): HGBA1C in the last 72 hours. CBG: Recent Labs  Lab 10/17/20 0843 10/17/20 1420 10/17/20 1659 10/17/20 2021 10/18/20 0751  GLUCAP 202* 134* 201* 168* 161*   Lipid Profile: No results for input(s): CHOL, HDL, LDLCALC, TRIG, CHOLHDL, LDLDIRECT in the last 72 hours. Thyroid Function Tests: No results for input(s): TSH, T4TOTAL, FREET4, T3FREE, THYROIDAB in the last 72 hours. Anemia Panel: No results for input(s): VITAMINB12, FOLATE, FERRITIN, TIBC, IRON, RETICCTPCT in the last 72 hours. Sepsis Labs: Recent Labs  Lab 10/15/20 1958 10/16/20 0500 10/17/20 0352  PROCALCITON  --  3.26 4.86  LATICACIDVEN 1.8  --   --     Recent Results (from the past 240 hour(s))  Blood Culture (routine x 2)     Status: None (Preliminary result)   Collection Time: 10/15/20  8:03 PM   Specimen: BLOOD  Result Value Ref Range Status   Specimen Description BLOOD BLOOD RIGHT FOREARM  Final   Special Requests   Final    BOTTLES DRAWN AEROBIC AND ANAEROBIC Blood Culture adequate volume   Culture   Final    NO GROWTH 3 DAYS Performed at [facility], [address], [place], [place] [address]    Report Status PENDING  Incomplete  Resp Panel by RT-PCR (Flu A&B, Covid) Nasopharyngeal Swab     Status: None   Collection Time: 10/15/20  8:26 PM   Specimen: Nasopharyngeal Swab; Nasopharyngeal(NP) swabs in vial transport medium  Result Value Ref Range Status   SARS  Coronavirus 2 by RT PCR NEGATIVE NEGATIVE Final    Comment: (NOTE) SARS-CoV-2 target nucleic acids are NOT DETECTED.  The SARS-CoV-2 RNA is generally detectable in upper respiratory specimens during the acute phase of infection. The lowest concentration of SARS-CoV-2 viral copies this assay can detect is 138 copies/mL. A negative result does not preclude SARS-Cov-2 infection and should not be used as the sole basis for treatment or other patient management decisions. A negative result may occur with  improper specimen collection/handling, submission of specimen other than nasopharyngeal swab, presence of viral mutation(s) within the areas targeted by this assay, and inadequate number  of viral copies(<138 copies/mL). A negative result must be combined with clinical observations, patient history, and epidemiological information. The expected result is Negative.  Fact Sheet for Patients:  EntrepreneurPulse.com.au  Fact Sheet for Healthcare Providers:  IncredibleEmployment.be  This test is no t yet approved or cleared by the [place] FDA and  has been authorized for detection and/or diagnosis of SARS-CoV-2 by FDA under an Emergency Use Authorization (EUA). This EUA will remain  in effect (meaning this test can be used) for the duration of the COVID-19 declaration under Section 564(b)(1) of the Act, 21 U.S.C.section 360bbb-3(b)(1), unless the authorization is terminated  or revoked sooner.       Influenza A by PCR NEGATIVE NEGATIVE Final   Influenza B by PCR NEGATIVE NEGATIVE Final    Comment: (NOTE) The Xpert Xpress SARS-CoV-2/FLU/RSV plus assay is intended as an aid in the diagnosis of influenza from Nasopharyngeal swab specimens and should not be used as a sole basis for treatment. Nasal washings and aspirates are unacceptable for Xpert Xpress SARS-CoV-2/FLU/RSV testing.  Fact Sheet for  Patients: EntrepreneurPulse.com.au  Fact Sheet for Healthcare Providers: IncredibleEmployment.be  This test is not yet approved or cleared by the [place] FDA and has been authorized for detection and/or diagnosis of SARS-CoV-2 by FDA under an Emergency Use Authorization (EUA). This EUA will remain in effect (meaning this test can be used) for the duration of the COVID-19 declaration under Section 564(b)(1) of the Act, 21 U.S.C. section 360bbb-3(b)(1), unless the authorization is terminated or revoked.  Performed at [facility], [address] [place], [place] [address]   Blood Culture (routine x 2)     Status: None (Preliminary result)   Collection Time: 10/15/20  8:26 PM   Specimen: BLOOD  Result Value Ref Range Status   Specimen Description BLOOD RIGHT ANTECUBITAL  Final   Special Requests   Final    BOTTLES DRAWN AEROBIC AND ANAEROBIC Blood Culture adequate volume   Culture   Final    NO GROWTH 3 DAYS Performed at [facility], [address] [place], [place] [address]    Report Status PENDING  Incomplete  Aerobic Culture w Gram Stain (superficial specimen)     Status: None (Preliminary result)   Collection Time: 10/17/20  8:52 PM   Specimen: Wound  Result Value Ref Range Status   Specimen Description   Final    WOUND Performed at [facility], [address] [place], [place] [address]    Special Requests   Final    NONE Performed at [facility], [address] [place], [place] [address]    Gram Stain   Final    RARE SQUAMOUS EPITHELIAL CELLS PRESENT FEW WBC PRESENT, PREDOMINANTLY MONONUCLEAR ABUNDANT GRAM VARIABLE ROD FEW GRAM POSITIVE COCCI Performed at [facility], [place] [address], [place], [place] [address]    Culture PENDING  Incomplete   Report Status PENDING  Incomplete         Radiology Studies: MR FOOT RIGHT WO CONTRAST  Result Date: 10/17/2020 CLINICAL  DATA:  History of diabetes and end-stage renal disease. Soft tissue wound of the lateral forefoot. EXAM: MRI OF THE RIGHT FOREFOOT WITHOUT CONTRAST TECHNIQUE: Multiplanar, multisequence MR imaging of the right foot was performed. No intravenous contrast was administered. COMPARISON:  None. FINDINGS: Bones/Joint/Cartilage Soft tissue wound of the fifth digit along the dorsal aspect. Bone marrow edema in the fifth metatarsal head and fifth proximal phalanx concerning for osteomyelitis. No acute fracture  or dislocation. Normal alignment. No joint effusion. Mild osteoarthritis of the first MTP joint. Ligaments Collateral ligaments are intact. Lisfranc ligament is intact. Muscles and Tendons Flexor, peroneal and extensor compartment tendons are intact. There are small foci of low signal within the plantar musculature concerning for soft tissue air as can be seen with necrotizing infection. Mild edema within the plantar musculature. Soft tissue No fluid collection or hematoma. No soft tissue mass. Soft tissue edema throughout the dorsal aspect of the foot which may be reactive versus secondary to cellulitis. IMPRESSION: 1. Soft tissue wound of the fifth digit along the dorsal aspect. Bone marrow edema in the fifth metatarsal head and fifth proximal phalanx concerning for osteomyelitis. 2. There are small foci of low signal within the plantar musculature concerning for air as can be seen with necrotizing infection. 3. Soft tissue edema throughout the dorsal aspect of the foot which may be reactive versus secondary to cellulitis. Electronically Signed   By: Kathreen Devoid M.D.   On: 10/17/2020 06:23   US ARTERIAL ABI (SCREENING LOWER EXTREMITY)  Result Date: 10/16/2020 CLINICAL DATA:  Bilateral lower extremity pain and claudication. Right lower extremity gangrene. Diabetes, right vascular ulcers, hypertension, hyperlipidemia. EXAM: NONINVASIVE PHYSIOLOGIC VASCULAR STUDY OF BILATERAL LOWER EXTREMITIES TECHNIQUE: Evaluation  of both lower extremities were performed at rest, including calculation of ankle-brachial indices with single level Doppler, pressure and pulse volume recording. COMPARISON:  None. FINDINGS: Right ABI:  0.68 Left ABI:  0.59 Right Lower Extremity: Dorsalis pedis was noncompressible. Monophasic arterial waveforms at the ankle. Left Lower Extremity: Dorsalis pedis noncompressible. Monophasic waveforms at the ankle. IMPRESSION: Moderate bilateral lower extremity arterial occlusive disease at rest. Electronically Signed   By: Lucrezia Europe M.D.   On: 10/16/2020 12:29        Scheduled Meds:  aspirin EC  81 mg Oral Daily   atorvastatin  80 mg Oral q1800   Chlorhexidine Gluconate Cloth  6 each Topical Daily   cholecalciferol  4,000 Units Oral Weekly   cinacalcet  60 mg Oral Daily   docusate sodium  100 mg Oral Daily   ferric citrate  210 mg Oral TID WC   heparin  5,000 Units Subcutaneous Q8H   insulin aspart  0-5 Units Subcutaneous QHS   insulin aspart  0-9 Units Subcutaneous TID WC   insulin aspart  2 Units Subcutaneous TID WC   insulin glargine-yfgn  36 Units Subcutaneous QHS   linaclotide  290 mcg Oral q morning   mometasone-formoterol  2 puff Inhalation BID   pantoprazole  40 mg Oral Daily   sodium chloride flush  3 mL Intravenous Q12H   Continuous Infusions:  sodium chloride Stopped (10/16/20 1111)   sodium chloride     [START ON 10/19/2020]  ceFAZolin (ANCEF) IV     ceFEPime (MAXIPIME) IV Stopped (10/17/20 2137)   vancomycin Stopped (10/17/20 1716)     LOS: 3 days    Time spent: 33 mins     Wyvonnia Dusky, MD [facility] Pager [phone]  If 7PM-7AM, please contact night-coverage 10/18/2020, 9:04 AM

## 2020-10-19 DIAGNOSIS — L97519 Non-pressure chronic ulcer of other part of right foot with unspecified severity: Secondary | ICD-10-CM | POA: Diagnosis not present

## 2020-10-19 DIAGNOSIS — L089 Local infection of the skin and subcutaneous tissue, unspecified: Secondary | ICD-10-CM | POA: Diagnosis not present

## 2020-10-19 DIAGNOSIS — E11628 Type 2 diabetes mellitus with other skin complications: Secondary | ICD-10-CM | POA: Diagnosis not present

## 2020-10-19 DIAGNOSIS — E11621 Type 2 diabetes mellitus with foot ulcer: Secondary | ICD-10-CM | POA: Diagnosis not present

## 2020-10-19 DIAGNOSIS — N186 End stage renal disease: Secondary | ICD-10-CM | POA: Diagnosis not present

## 2020-10-19 DIAGNOSIS — I96 Gangrene, not elsewhere classified: Secondary | ICD-10-CM | POA: Diagnosis not present

## 2020-10-19 LAB — CBC
HCT: 24.3 % — ABNORMAL LOW (ref 36.0–46.0)
Hemoglobin: 7.9 g/dL — ABNORMAL LOW (ref 12.0–15.0)
MCH: 30.9 pg (ref 26.0–34.0)
MCHC: 32.5 g/dL (ref 30.0–36.0)
MCV: 94.9 fL (ref 80.0–100.0)
Platelets: 351 10*3/uL (ref 150–400)
RBC: 2.56 MIL/uL — ABNORMAL LOW (ref 3.87–5.11)
RDW: 13.3 % (ref 11.5–15.5)
WBC: 22.9 10*3/uL — ABNORMAL HIGH (ref 4.0–10.5)
nRBC: 0 % (ref 0.0–0.2)

## 2020-10-19 LAB — BASIC METABOLIC PANEL
Anion gap: 17 — ABNORMAL HIGH (ref 5–15)
BUN: 61 mg/dL — ABNORMAL HIGH (ref 6–20)
CO2: 24 mmol/L (ref 22–32)
Calcium: 7.8 mg/dL — ABNORMAL LOW (ref 8.9–10.3)
Chloride: 85 mmol/L — ABNORMAL LOW (ref 98–111)
Creatinine, Ser: 8.86 mg/dL — ABNORMAL HIGH (ref 0.44–1.00)
GFR, Estimated: 5 mL/min — ABNORMAL LOW (ref >60)
Glucose, Bld: 325 mg/dL — ABNORMAL HIGH (ref 70–99)
Potassium: 4.7 mmol/L (ref 3.5–5.1)
Sodium: 126 mmol/L — ABNORMAL LOW (ref 135–145)

## 2020-10-19 LAB — GLUCOSE, CAPILLARY
Glucose-Capillary: 334 mg/dL — ABNORMAL HIGH (ref 70–99)
Glucose-Capillary: 150 mg/dL — ABNORMAL HIGH (ref 70–99)
Glucose-Capillary: 186 mg/dL — ABNORMAL HIGH (ref 70–99)
Glucose-Capillary: 210 mg/dL — ABNORMAL HIGH (ref 70–99)

## 2020-10-19 LAB — HEPATITIS B SURFACE ANTIGEN: Hepatitis B Surface Ag: NONREACTIVE

## 2020-10-19 LAB — MAGNESIUM: Magnesium: 1.8 mg/dL (ref 1.7–2.4)

## 2020-10-19 MED ORDER — PIPERACILLIN-TAZOBACTAM IN DEX 2-0.25 GM/50ML IV SOLN
2.2500 g | Freq: Three times a day (TID) | INTRAVENOUS | Status: DC
  Filled 2020-10-19 (×2): qty 50

## 2020-10-19 NOTE — Progress Notes (Cosign Needed)
Due to Body Habitus a regular walker will not work for the patient's needs of being unsteady and week.  She will need a Bariatric walker.

## 2020-10-19 NOTE — Progress Notes (Signed)
I Pt is bed Tired Says the rt toe infection has not been cleaned since she came to the hospital She has not made up her mind about surgery  O/E Awake BP 121/68 (BP Location: Right Arm)   Pulse (!) 101   Temp 99.7 F (37.6 C)   Resp 16   Ht 5' 4.5" (1.638 m)   Wt 126.1 kg   LMP 09/06/2016 (Approximate)   SpO2 97%   BMI 46.98 kg/m   Chest b/l air entry Hss1s2 Abd soft Rt foot 5th toe- gangrenous Web space infection Placed acquacel  Labs CBC Latest Ref Rng & Units 10/19/2020 10/18/2020 10/17/2020  WBC 4.0 - 10.5 K/uL 22.9(H) 20.7(H) 20.8(H)  Hemoglobin 12.0 - 15.0 g/dL 7.9(L) 8.6(L) 9.3(L)  Hematocrit 36.0 - 46.0 % 24.3(L) 26.6(L) 29.4(L)  Platelets 150 - 400 K/uL 351 328 314    CMP Latest Ref Rng & Units 10/19/2020 10/18/2020 10/17/2020  Glucose 70 - 99 mg/dL 325(H) 180(H) 194(H)  BUN 6 - 20 mg/dL 61(H) 48(H) 76(H)  Creatinine 0.44 - 1.00 mg/dL 8.86(H) 8.03(H) 10.68(H)  Sodium 135 - 145 mmol/L 126(L) 130(L) 128(L)  Potassium 3.5 - 5.1 mmol/L 4.7 4.2 5.0  Chloride 98 - 111 mmol/L 85(L) 89(L) 91(L)  CO2 22 - 32 mmol/L 24 25 21(L)  Calcium 8.9 - 10.3 mg/dL 7.8(L) 8.7(L) 9.4  Total Protein 6.5 - 8.1 g/dL - - -  Total Bilirubin 0.3 - 1.2 mg/dL - - -  Alkaline Phos 38 - 126 U/L - - -  AST 15 - 41 U/L - - -  ALT 0 - 44 U/L - - -    MICRO Wound culture  enterococcus and klebsiella  Impression/recommendation  Rt 5th toe gangrene with infection of web space Diabetic foot infection Needs amputation- she is aware but has not made up her mind Antibiotics ( broad spectrum) not seem to be helping Persistent leucocytosis from the gangrene Kleb and enterococcus in the wound culture- will change vanco and cefepime ( already had it today) to zosyn starting tomorrow  DM- on insulin  ESRD on dialysis thru left Avfistula  HLD on atrovastatin  Discussed the management with the patient

## 2020-10-19 NOTE — Evaluation (Addendum)
Occupational Therapy Evaluation Patient Details Name: [name] MRN: [id number] DOB: 04/13/1970 Today's Date: 10/19/2020    History of Present Illness Pt is a 50 y/o F admitted on 10/15/20 with c/o generalized malaise, fever & fatigue. Pt notes her toe has been black & malodorous; pt has been followed for her diabetic foot wound & per pt it has been draining.  PMH: DM, ESRD, anxiety, chronic respiratory failure on home O2, obesity, orthopnea. Pt with osteomyelitis, Podiatry recommending R fifth toe amputation.   Clinical Impression   Pt seen for OT Evaluation this date in setting of acute hospitalization d/t diabetic foot ulcer. Pt reports at baseline, that she usually uses rollator for fxl mobility, but states it is borrowed and old and her foot has been hurting so bad recently that she used electric scooter for last 2 HD appts. Pt states she is able to do her basic self care, but reports that her spouse performs most HH IADLs including cooking, cleaning and laundry. Pt presents this date with decreased fxl activity tolerance, decreased balance, and decreased strength. On ADL assessment, pt requires: MIN/MOD A for bed level and seated UB bathing. MOD/MAX A for LB bathing in standing with CGA to sustain static standing balance with RW. Pt able to don socks bed level using circle-sit method with external hip rotation with MIN A. Pt able to come to EOB sitting with MIN A and requires MIN/MOD A with 2WW for STS and SPS from bed to recliner. Pt participates in ADLs with OT and is returned to bed with all needs met and in reach at end of session. OT reports session contents to RN including that pt feels she needs her breathing tx. Will continue to follow acutely. At this time, as pt presents with increased weakness and is requiring more assistance with self care and ADL transfers/mobility, anticipate she will require STR f/u OT services.     Follow Up Recommendations  SNF    Equipment Recommendations   Other (comment) (defer to next level of care)    Recommendations for Other Services       Precautions / Restrictions Precautions Precautions: Fall Restrictions Other Position/Activity Restrictions: treated as heel WB for safety/preservation of anterior aspect of foot as no formal orders in place at this time and pt still deciding about podiatry sx.      Mobility Bed Mobility Overal bed mobility: Needs Assistance Bed Mobility: Supine to Sit;Sit to Supine     Supine to sit: Min assist;HOB elevated Sit to supine: Supervision        Transfers Overall transfer level: Needs assistance Equipment used: Rolling walker (2 wheeled) Transfers: Sit to/from [facility] Sit to Stand: Min assist;Mod assist Stand pivot transfers: Min assist;Mod assist       General transfer comment: cues for hand placement with RW, rock to gain momentum    Balance Overall balance assessment: Needs assistance   Sitting balance-Leahy Scale: Good     Standing balance support: Bilateral upper extremity supported Standing balance-Leahy Scale: Poor Standing balance comment: requires UE support and CGA to sustain static standing                           ADL either performed or assessed with clinical judgement   ADL  General ADL Comments: Pt requires MIN/MOD A for bed level and seated UB bathing. MOD/MAX A for LB bathing in standing with CGA to sustain static standing balance with RW. Pt able to don socks bed level using circle-sit method with external hip rotation with MIN A.     Vision Patient Visual Report: No change from baseline       Perception     Praxis      Pertinent Vitals/Pain Pain Assessment: 0-10 Pain Score: 7  Pain Location: R foot Pain Descriptors / Indicators: Aching;Tender;Sore Pain Intervention(s): Limited activity within patient's tolerance;Monitored during session     Hand Dominance  Right   Extremity/Trunk Assessment Upper Extremity Assessment Upper Extremity Assessment: Generalized weakness (ROM WFL. MMT grossly 4-/5)   Lower Extremity Assessment Lower Extremity Assessment: Defer to PT evaluation;Generalized weakness       Communication Communication Communication: No difficulties   Cognition Arousal/Alertness: Awake/alert Behavior During Therapy: WFL for tasks assessed/performed Overall Cognitive Status: Within Functional Limits for tasks assessed                                     General Comments       Exercises Other Exercises Other Exercises: OT engages pt in ed re: role of OT, importance of OOB activity, safe use of RW. Pt with good understanding.   Shoulder Instructions      Home Living Family/patient expects to be discharged to:: Private residence Living Arrangements: Spouse/significant other;Children Available Help at Discharge: Family;Available PRN/intermittently Type of Home: House Home Access: Ramped entrance     Home Layout: One level     Bathroom Shower/Tub: Tub/shower unit         Home Equipment: Environmental consultant - 4 wheels;Electric scooter;Shower seat;Hand held shower head;Grab bars - tub/shower          Prior Functioning/Environment Level of Independence: Needs assistance  Gait / Transfers Assistance Needed: MOD I with rollator for fxl mobility. States that the rollator was a hand-me-down and does not work well. States for past 2 times she has left the house, she's used electric scooter for fxl mobility d/t pain in R foot. ADL's / Homemaking Assistance Needed: Able to perform all basic self care, but endorses struggle. States her spouse performs all IADLs including cooking, cleaning and laundry.            OT Problem List: Decreased strength;Decreased activity tolerance;Impaired balance (sitting and/or standing);Decreased knowledge of use of DME or AE;Cardiopulmonary status limiting activity;Obesity      OT  Treatment/Interventions: Self-care/ADL training;Therapeutic exercise;Energy conservation;DME and/or AE instruction;Therapeutic activities;Patient/family education    OT Goals(Current goals can be found in the care plan section) Acute Rehab OT Goals Patient Stated Goal: to go home OT Goal Formulation: With patient Time For Goal Achievement: 11/02/20 Potential to Achieve Goals: Good ADL Goals Pt Will Perform Upper Body Bathing: with set-up;sitting Pt Will Perform Lower Body Bathing: with min assist;sit to/from stand (for perineal area in standing with RW for standing balance) Pt Will Perform Lower Body Dressing: with supervision;sit to/from stand (with AE PRN) Pt Will Transfer to Toilet: with min guard assist;ambulating;bedside commode (CGA with LRAD for fxl mobility at least ~10-15' to [facility]) Pt Will Perform Toileting - Clothing Manipulation and hygiene: with min assist;sit to/from stand Pt/caregiver will Perform Home Exercise Program: Increased strength;Both right and left upper extremity;With Supervision  OT Frequency: Min 2X/week   Barriers to D/C:  Co-evaluation              AM-PAC OT "6 Clicks" Daily Activity     Outcome Measure Help from another person eating meals?: None Help from another person taking care of personal grooming?: A Little Help from another person toileting, which includes using toliet, bedpan, or urinal?: A Lot Help from another person bathing (including washing, rinsing, drying)?: A Lot Help from another person to put on and taking off regular upper body clothing?: A Little Help from another person to put on and taking off regular lower body clothing?: A Lot 6 Click Score: 16   End of Session Equipment Utilized During Treatment: Rolling walker;Oxygen Nurse Communication: Mobility status;Other (comment) (notified pt requesting breathin tx and OT called respiratory, notified CM that pt wanting rollator, but therapy recommending 2ww.)  Activity  Tolerance: Patient tolerated treatment well Patient left: with call bell/phone within reach  OT Visit Diagnosis: Unsteadiness on feet (R26.81);Muscle weakness (generalized) (M62.81);Pain Pain - Right/Left: Right Pain - part of body: Ankle and joints of foot                Time: 1451-1558 OT Time Calculation (min): 67 min Charges:  OT General Charges $OT Visit: 1 Visit OT Evaluation $OT Eval Moderate Complexity: 1 Mod OT Treatments $Self Care/Home Management : 23-37 mins $Therapeutic Activity: 23-37 mins  Gerrianne Scale, MS, OTR/L ascom [phone] 10/19/20, 4:28 PM

## 2020-10-19 NOTE — Progress Notes (Signed)
[facility]  ROUNDING NOTE   Subjective:   [name] was admitted to [facility] on 10/15/2020 for Gangrene [facility]) [I96] Diabetic foot ulcer ([place]) [E11.621, L97.509] Decreased pedal pulses [R09.89] Sepsis, due to unspecified organism, unspecified whether acute organ dysfunction present [facility]) [A41.9]  Last hemodialysis treatment was Saturday, 8/20. She was having hypotension on her treatment and she took midodrine to complete her treatment.   Patient seen during dialysis   HEMODIALYSIS FLOWSHEET:  Blood Flow Rate (mL/min): 400 mL/min Arterial Pressure (mmHg): -150 mmHg Venous Pressure (mmHg): 200 mmHg Transmembrane Pressure (mmHg): 70 mmHg Ultrafiltration Rate (mL/min): 670 mL/min Dialysate Flow Rate (mL/min): 500 ml/min Conductivity: Machine : 13.6 Conductivity: Machine : 13.6 Dialysis Fluid Bolus: Normal Saline Bolus Amount (mL): 200 mL  No complaints at this time.  Concerned about treatment plan for right toe Wants to have amputation as a last resort   Objective:  Vital signs in last 24 hours:  Temp:  [98.6 F (37 C)-99.1 F (37.3 C)] 98.9 F (37.2 C) (08/25 1325) Pulse Rate:  [47-116] 100 (08/25 1325) Resp:  [17-31] 18 (08/25 1325) BP: (92-132)/(38-73) 116/69 (08/25 1325) SpO2:  [93 %-100 %] 95 % (08/25 1325)  Weight change:  Filed Weights   10/15/20 1905 10/18/20 0928  Weight: 126.1 kg 126.1 kg    Intake/Output: I/O last 3 completed shifts: In: 300 [IV Piggyback:300] Out: -    Intake/Output this shift:  Total I/O In: -  Out: 1500 [Other:1500]  Physical Exam: General: NAD, laying in bed  Head: Normocephalic, atraumatic. Moist oral mucosal membranes  Eyes: Anicteric  Lungs:  Clear to auscultation, O2 2L  Heart: Regular rate and rhythm  Abdomen:  Soft, nontender  Extremities:  trace peripheral edema. Right foot with ulcer under fifth toe  Neurologic: Nonfocal, moving all four extremities  Skin: No lesions  Access: Left AVF     Basic Metabolic Panel: Recent Labs  Lab 10/16/20 0347 10/16/20 1402 10/17/20 0352 10/18/20 0602 10/19/20 0526  NA 132* 128* 128* 130* 126*  K 4.5 5.0 5.0 4.2 4.7  CL 94* 90* 91* 89* 85*  CO2 24 19* 21* 25 24  GLUCOSE 217* 295* 194* 180* 325*  BUN 55* 60* 76* 48* 61*  CREATININE 9.18* 9.75* 10.68* 8.03* 8.86*  CALCIUM 9.1 9.1 9.4 8.7* 7.8*  MG  --   --  2.0 1.8 1.8     Liver Function Tests: Recent Labs  Lab 10/15/20 1958 10/16/20 0347  AST 64* 45*  ALT 59* 47*  ALKPHOS 559* 474*  BILITOT 0.7 0.9  PROT 8.3* 7.4  ALBUMIN 3.3* 2.7*    No results for input(s): LIPASE, AMYLASE in the last 168 hours. No results for input(s): AMMONIA in the last 168 hours.  CBC: Recent Labs  Lab 10/15/20 1958 10/16/20 1402 10/17/20 0352 10/18/20 0602 10/19/20 0526  WBC 22.1* 21.4* 20.8* 20.7* 22.9*  NEUTROABS 19.0*  --   --   --   --   HGB 9.8* 8.9* 9.3* 8.6* 7.9*  HCT 30.6* 28.5* 29.4* 26.6* 24.3*  MCV 96.5 97.6 95.1 94.0 94.9  PLT 358 325 314 328 351     Cardiac Enzymes: No results for input(s): CKTOTAL, CKMB, CKMBINDEX, TROPONINI in the last 168 hours.  BNP: Invalid input(s): POCBNP  CBG: Recent Labs  Lab 10/18/20 1200 10/18/20 1642 10/18/20 2025 10/19/20 0818 10/19/20 1337  GLUCAP 184* 242* 238* 334* 150*     Microbiology: Results for orders placed or performed during the hospital encounter of  10/15/20  Blood Culture (routine x 2)     Status: None (Preliminary result)   Collection Time: 10/15/20  8:03 PM   Specimen: BLOOD  Result Value Ref Range Status   Specimen Description BLOOD BLOOD RIGHT FOREARM  Final   Special Requests   Final    BOTTLES DRAWN AEROBIC AND ANAEROBIC Blood Culture adequate volume   Culture   Final    NO GROWTH 4 DAYS Performed at [facility], [address] [place], [place] [address]    Report Status PENDING  Incomplete  Resp Panel by RT-PCR (Flu A&B, Covid) Nasopharyngeal Swab     Status: None   Collection Time:  10/15/20  8:26 PM   Specimen: Nasopharyngeal Swab; Nasopharyngeal(NP) swabs in vial transport medium  Result Value Ref Range Status   SARS Coronavirus 2 by RT PCR NEGATIVE NEGATIVE Final    Comment: (NOTE) SARS-CoV-2 target nucleic acids are NOT DETECTED.  The SARS-CoV-2 RNA is generally detectable in upper respiratory specimens during the acute phase of infection. The lowest concentration of SARS-CoV-2 viral copies this assay can detect is 138 copies/mL. A negative result does not preclude SARS-Cov-2 infection and should not be used as the sole basis for treatment or other patient management decisions. A negative result may occur with  improper specimen collection/handling, submission of specimen other than nasopharyngeal swab, presence of viral mutation(s) within the areas targeted by this assay, and inadequate number of viral copies(<138 copies/mL). A negative result must be combined with clinical observations, patient history, and epidemiological information. The expected result is Negative.  Fact Sheet for Patients:  EntrepreneurPulse.com.au  Fact Sheet for Healthcare Providers:  IncredibleEmployment.be  This test is no t yet approved or cleared by the [place] FDA and  has been authorized for detection and/or diagnosis of SARS-CoV-2 by FDA under an Emergency Use Authorization (EUA). This EUA will remain  in effect (meaning this test can be used) for the duration of the COVID-19 declaration under Section 564(b)(1) of the Act, 21 U.S.C.section 360bbb-3(b)(1), unless the authorization is terminated  or revoked sooner.       Influenza A by PCR NEGATIVE NEGATIVE Final   Influenza B by PCR NEGATIVE NEGATIVE Final    Comment: (NOTE) The Xpert Xpress SARS-CoV-2/FLU/RSV plus assay is intended as an aid in the diagnosis of influenza from Nasopharyngeal swab specimens and should not be used as a sole basis for treatment. Nasal washings  and aspirates are unacceptable for Xpert Xpress SARS-CoV-2/FLU/RSV testing.  Fact Sheet for Patients: EntrepreneurPulse.com.au  Fact Sheet for Healthcare Providers: IncredibleEmployment.be  This test is not yet approved or cleared by the [place] FDA and has been authorized for detection and/or diagnosis of SARS-CoV-2 by FDA under an Emergency Use Authorization (EUA). This EUA will remain in effect (meaning this test can be used) for the duration of the COVID-19 declaration under Section 564(b)(1) of the Act, 21 U.S.C. section 360bbb-3(b)(1), unless the authorization is terminated or revoked.  Performed at [facility], [address] [place], [place] [address]   Blood Culture (routine x 2)     Status: None (Preliminary result)   Collection Time: 10/15/20  8:26 PM   Specimen: BLOOD  Result Value Ref Range Status   Specimen Description BLOOD RIGHT ANTECUBITAL  Final   Special Requests   Final    BOTTLES DRAWN AEROBIC AND ANAEROBIC Blood Culture adequate volume   Culture   Final    NO GROWTH 4 DAYS Performed at [facility], [address], [place], [place] [address]    Report Status PENDING  Incomplete  Aerobic Culture w Gram Stain (superficial specimen)     Status: None (Preliminary result)   Collection Time: 10/17/20  8:52 PM   Specimen: Wound  Result Value Ref Range Status   Specimen Description   Final    WOUND Performed at [facility], [address], [place], [place] [address]    Special Requests   Final    NONE Performed at [facility], [address] [place], [place] [address]    Gram Stain   Final    RARE SQUAMOUS EPITHELIAL CELLS PRESENT FEW WBC PRESENT, PREDOMINANTLY MONONUCLEAR ABUNDANT GRAM VARIABLE ROD FEW GRAM POSITIVE COCCI    Culture   Final    CULTURE REINCUBATED FOR BETTER GROWTH Performed at [facility], [place] [address] [place], [place] [address]     Report Status PENDING  Incomplete    Coagulation Studies: No results for input(s): LABPROT, INR in the last 72 hours.   Urinalysis: No results for input(s): COLORURINE, LABSPEC, PHURINE, GLUCOSEU, HGBUR, BILIRUBINUR, KETONESUR, PROTEINUR, UROBILINOGEN, NITRITE, LEUKOCYTESUR in the last 72 hours.  Invalid input(s): APPERANCEUR    Imaging: PERIPHERAL VASCULAR CATHETERIZATION  Result Date: 10/18/2020 See surgical note for result.    Medications:    sodium chloride Stopped (10/16/20 1111)   ceFEPime (MAXIPIME) IV Stopped (10/18/20 1415)   vancomycin 1,000 mg (10/19/20 1444)    aspirin EC  81 mg Oral Daily   atorvastatin  80 mg Oral q1800   Chlorhexidine Gluconate Cloth  6 each Topical Daily   cholecalciferol  4,000 Units Oral Weekly   cinacalcet  60 mg Oral Daily   docusate sodium  100 mg Oral Daily   ferric citrate  210 mg Oral TID WC   heparin  5,000 Units Subcutaneous Q8H   insulin aspart  0-5 Units Subcutaneous QHS   insulin aspart  0-9 Units Subcutaneous TID WC   insulin aspart  2 Units Subcutaneous TID WC   insulin glargine-yfgn  36 Units Subcutaneous QHS   linaclotide  290 mcg Oral q morning   mometasone-formoterol  2 puff Inhalation BID   pantoprazole  40 mg Oral Daily   sodium chloride flush  3 mL Intravenous Q12H   sodium chloride, acetaminophen, albuterol, albuterol, alum & mag hydroxide-simeth, calcium carbonate, dextromethorphan-guaiFENesin, hydrALAZINE, HYDROmorphone (DILAUDID) injection, ipratropium-albuterol, lactulose, metoprolol tartrate, ondansetron (ZOFRAN) IV, ondansetron, oxyCODONE, polyethylene glycol, sodium chloride flush, traZODone  Assessment/ Plan:  [name] is a 50 y.o. black female with end stage renal disease on hemodialysis, diabetes mellitus type II, hypertension, congestive heart failure, COPD, who is admitted to [facility] on 10/15/2020 for Gangrene [facility]) [I96] Diabetic foot ulcer ([place]) [U93.235, L97.509] Decreased pedal pulses  [R09.89] Sepsis, due to unspecified organism, unspecified whether acute organ dysfunction present [facility]) [A41.9]  [facility] Left AVF 127.5kg  End Stage Renal Disease: last dialysis was 10/14/20. Received dialysis today, UF goal 1.5L achieved.  Tolerated well. Next treatment scheduled for Saturday  Hypotension: secondary to infection. Holding home regimen torsemide, losartan, clonidine. 116/69  Anemia with chronic kidney disease: hemoglobin 7.9. Low dose EPO with next HD treatment.   Secondary Hyperparathyroidism with hyeprphosphatemia: there is risk for calciphylaxis. Outpatinet labs on 8/9: phos 10.6, calcium 8.2, PTH 1456.  - resume  cinacalcet and sevelamer with meals.   Diabetes mellitus type II with chronic kidney disease: insulin dependent. Complicated with diabetic foot ulcer. History of poor control. Hemoglobin  A1c 9.2% on 09/19/2020.  - Appreciate podiatry and vascular input.  - empiric ceftriaxone and metronidazole.  - Vascular performed right lower angiogram today. 30% mild lesion found popliteal artery. Deemed adequate for wound healing.  - Awaiting podiatry evaluation since procedure     LOS: 4 [place] 8/25/20222:55 PM

## 2020-10-19 NOTE — Progress Notes (Signed)
PROGRESS NOTE    [name]  ZOX:[id number] DOB: 1970/04/28 DOA: 10/15/2020 PCP: [name], NP   Assessment & Plan:   Principal Problem:   Diabetic foot ulcer ([place]) Active Problems:   Diabetes ([place])   Essential hypertension   DNR (do not resuscitate) discussion   End-stage renal disease on hemodialysis ([place])   Chronic respiratory failure with hypoxia and hypercapnia (H[facility])   Decreased pedal pulses   Diabetic foot ulcer: w/ gangrene of 5th toe & osteomyelitis. Continue on IV cefepime, vanco as per ID. Wound cx grow gram positive cocci. Oxycodone prn for pain   Possible PVD: has decreased pedal pulses. ABI shows moderate occlusive disease bilateral lower extremity. S/p angiogram 8/24 which showed normal common femoral artery, profunda femoris artery, superficial femoral artery & popliteal artery had mild lesion of 30% or < just below the knee.   Leukocytosis: secondary to above infection. Continue on IV abxs   DM2: poorly controlled w/ HbA1c 8.9. Continue on glargine, SSI w/ accuchecks    ESRD: on HD. Management per nephro   ACD: likely secondary to ESRD. No need for a transfusion currently   Hyponatremia: managed w/ HD as per nephro   Chronic hypoxia: continue on supplemental oxygen at 3L [place]   Morbid obesity: BMI 46.9. Complicates overall care and prognosis       DVT prophylaxis: heparin  Code Status: full  Family Communication:  Disposition Plan: unclear, likely d/c back home   Level of care: Med-Surg  Status is: Inpatient  Remains inpatient appropriate because:Ongoing diagnostic testing needed not appropriate for outpatient work up, IV treatments appropriate due to intensity of illness or inability to take PO, and Inpatient level of care appropriate due to severity of illness  Dispo: The patient is from: Home              Anticipated d/c is to: Home              Patient currently is not medically stable to d/c.   Difficult to place patient :  unclear     Consultants:  Vascular surg ID  Procedures:   Antimicrobials: vanco, cefepime    Subjective: Pt c/o right foot pain   Objective: Vitals:   10/18/20 1958 10/18/20 2000 10/19/20 0438 10/19/20 0821  BP: 104/64  132/69 116/70  Pulse: 96  (!) 102 98  Resp: 20  20 17   Temp: 98.9 F (37.2 C)  98.6 F (37 C) 98.6 F (37 C)  TempSrc: Oral     SpO2: 99% 99% 93% 100%  Weight:      Height:       No intake or output data in the 24 hours ending 10/19/20 0831  Filed Weights   10/15/20 1905 10/18/20 0928  Weight: 126.1 kg 126.1 kg    Examination:  General exam: Appears calm and comfortable  Respiratory system: decreased breath sounds b/l  Cardiovascular system: S1/S2+. No rubs or clicks   Gastrointestinal system: Abd is soft, NT, obese & hypoactive bowel sounds Central nervous system: Awake and oriented. Moves all extremities  Psychiatry: Judgement and insight appear normal. Appropriate mood and affect     Data Reviewed: I have personally reviewed following labs and imaging studies  CBC: Recent Labs  Lab 10/15/20 1958 10/16/20 1402 10/17/20 0352 10/18/20 0602 10/19/20 0526  WBC 22.1* 21.4* 20.8* 20.7* 22.9*  NEUTROABS 19.0*  --   --   --   --   HGB 9.8* 8.9* 9.3* 8.6* 7.9*  HCT 30.6* 28.5* 29.4* 26.6* 24.3*  MCV 96.5 97.6 95.1 94.0 94.9  PLT 358 325 314 328 384   Basic Metabolic Panel: Recent Labs  Lab 10/16/20 0347 10/16/20 1402 10/17/20 0352 10/18/20 0602 10/19/20 0526  NA 132* 128* 128* 130* 126*  K 4.5 5.0 5.0 4.2 4.7  CL 94* 90* 91* 89* 85*  CO2 24 19* 21* 25 24  GLUCOSE 217* 295* 194* 180* 325*  BUN 55* 60* 76* 48* 61*  CREATININE 9.18* 9.75* 10.68* 8.03* 8.86*  CALCIUM 9.1 9.1 9.4 8.7* 7.8*  MG  --   --  2.0 1.8 1.8   GFR: Estimated Creatinine Clearance: 10.2 mL/min (A) (by C-G formula based on SCr of 8.86 mg/dL (H)). Liver Function Tests: Recent Labs  Lab 10/15/20 1958 10/16/20 0347  AST 64* 45*  ALT 59* 47*  ALKPHOS  559* 474*  BILITOT 0.7 0.9  PROT 8.3* 7.4  ALBUMIN 3.3* 2.7*   No results for input(s): LIPASE, AMYLASE in the last 168 hours. No results for input(s): AMMONIA in the last 168 hours. Coagulation Profile: Recent Labs  Lab 10/15/20 1958  INR 1.2   Cardiac Enzymes: No results for input(s): CKTOTAL, CKMB, CKMBINDEX, TROPONINI in the last 168 hours. BNP (last 3 results) No results for input(s): PROBNP in the last 8760 hours. HbA1C: No results for input(s): HGBA1C in the last 72 hours. CBG: Recent Labs  Lab 10/18/20 1101 10/18/20 1200 10/18/20 1642 10/18/20 2025 10/19/20 0818  GLUCAP 185* 184* 242* 238* 334*   Lipid Profile: No results for input(s): CHOL, HDL, LDLCALC, TRIG, CHOLHDL, LDLDIRECT in the last 72 hours. Thyroid Function Tests: No results for input(s): TSH, T4TOTAL, FREET4, T3FREE, THYROIDAB in the last 72 hours. Anemia Panel: No results for input(s): VITAMINB12, FOLATE, FERRITIN, TIBC, IRON, RETICCTPCT in the last 72 hours. Sepsis Labs: Recent Labs  Lab 10/15/20 1958 10/16/20 0500 10/17/20 0352  PROCALCITON  --  3.26 4.86  LATICACIDVEN 1.8  --   --     Recent Results (from the past 240 hour(s))  Blood Culture (routine x 2)     Status: None (Preliminary result)   Collection Time: 10/15/20  8:03 PM   Specimen: BLOOD  Result Value Ref Range Status   Specimen Description BLOOD BLOOD RIGHT FOREARM  Final   Special Requests   Final    BOTTLES DRAWN AEROBIC AND ANAEROBIC Blood Culture adequate volume   Culture   Final    NO GROWTH 4 DAYS Performed at [facility], [address] [place], [place] [address]    Report Status PENDING  Incomplete  Resp Panel by RT-PCR (Flu A&B, Covid) Nasopharyngeal Swab     Status: None   Collection Time: 10/15/20  8:26 PM   Specimen: Nasopharyngeal Swab; Nasopharyngeal(NP) swabs in vial transport medium  Result Value Ref Range Status   SARS Coronavirus 2 by RT PCR NEGATIVE NEGATIVE Final    Comment:  (NOTE) SARS-CoV-2 target nucleic acids are NOT DETECTED.  The SARS-CoV-2 RNA is generally detectable in upper respiratory specimens during the acute phase of infection. The lowest concentration of SARS-CoV-2 viral copies this assay can detect is 138 copies/mL. A negative result does not preclude SARS-Cov-2 infection and should not be used as the sole basis for treatment or other patient management decisions. A negative result may occur with  improper specimen collection/handling, submission of specimen other than nasopharyngeal swab, presence of viral mutation(s) within the areas targeted by this assay, and inadequate number of viral copies(<138 copies/mL). A  negative result must be combined with clinical observations, patient history, and epidemiological information. The expected result is Negative.  Fact Sheet for Patients:  EntrepreneurPulse.com.au  Fact Sheet for Healthcare Providers:  IncredibleEmployment.be  This test is no t yet approved or cleared by the [place] FDA and  has been authorized for detection and/or diagnosis of SARS-CoV-2 by FDA under an Emergency Use Authorization (EUA). This EUA will remain  in effect (meaning this test can be used) for the duration of the COVID-19 declaration under Section 564(b)(1) of the Act, 21 U.S.C.section 360bbb-3(b)(1), unless the authorization is terminated  or revoked sooner.       Influenza A by PCR NEGATIVE NEGATIVE Final   Influenza B by PCR NEGATIVE NEGATIVE Final    Comment: (NOTE) The Xpert Xpress SARS-CoV-2/FLU/RSV plus assay is intended as an aid in the diagnosis of influenza from Nasopharyngeal swab specimens and should not be used as a sole basis for treatment. Nasal washings and aspirates are unacceptable for Xpert Xpress SARS-CoV-2/FLU/RSV testing.  Fact Sheet for Patients: EntrepreneurPulse.com.au  Fact Sheet for Healthcare  Providers: IncredibleEmployment.be  This test is not yet approved or cleared by the [place] FDA and has been authorized for detection and/or diagnosis of SARS-CoV-2 by FDA under an Emergency Use Authorization (EUA). This EUA will remain in effect (meaning this test can be used) for the duration of the COVID-19 declaration under Section 564(b)(1) of the Act, 21 U.S.C. section 360bbb-3(b)(1), unless the authorization is terminated or revoked.  Performed at [facility], [address] [place], [place] [address]   Blood Culture (routine x 2)     Status: None (Preliminary result)   Collection Time: 10/15/20  8:26 PM   Specimen: BLOOD  Result Value Ref Range Status   Specimen Description BLOOD RIGHT ANTECUBITAL  Final   Special Requests   Final    BOTTLES DRAWN AEROBIC AND ANAEROBIC Blood Culture adequate volume   Culture   Final    NO GROWTH 4 DAYS Performed at [facility], [address], [place], [place] [address]    Report Status PENDING  Incomplete  Aerobic Culture w Gram Stain (superficial specimen)     Status: None (Preliminary result)   Collection Time: 10/17/20  8:52 PM   Specimen: Wound  Result Value Ref Range Status   Specimen Description   Final    WOUND Performed at [facility], [address], [place], [place] [address]    Special Requests   Final    NONE Performed at [facility], [address] [place], [place] [address]    Gram Stain   Final    RARE SQUAMOUS EPITHELIAL CELLS PRESENT FEW WBC PRESENT, PREDOMINANTLY MONONUCLEAR ABUNDANT GRAM VARIABLE ROD FEW GRAM POSITIVE COCCI    Culture   Final    TOO YOUNG TO READ Performed at [facility], [place] [address], [place], [place] [address]    Report Status PENDING  Incomplete         Radiology Studies: PERIPHERAL VASCULAR CATHETERIZATION  Result Date: 10/18/2020 See surgical note for result.       Scheduled Meds:  aspirin EC   81 mg Oral Daily   atorvastatin  80 mg Oral q1800   Chlorhexidine Gluconate Cloth  6 each Topical Daily   cholecalciferol  4,000 Units Oral Weekly   cinacalcet  60 mg Oral Daily   docusate sodium  100 mg Oral Daily   ferric citrate  210 mg Oral TID WC  heparin  5,000 Units Subcutaneous Q8H   insulin aspart  0-5 Units Subcutaneous QHS   insulin aspart  0-9 Units Subcutaneous TID WC   insulin aspart  2 Units Subcutaneous TID WC   insulin glargine-yfgn  36 Units Subcutaneous QHS   linaclotide  290 mcg Oral q morning   mometasone-formoterol  2 puff Inhalation BID   pantoprazole  40 mg Oral Daily   sodium chloride flush  3 mL Intravenous Q12H   Continuous Infusions:  sodium chloride Stopped (10/16/20 1111)   ceFEPime (MAXIPIME) IV Stopped (10/18/20 1415)   vancomycin Stopped (10/17/20 1716)     LOS: 4 days    Time spent: 30 mins     Wyvonnia Dusky, MD [facility] Pager [phone]  If 7PM-7AM, please contact night-coverage 10/19/2020, 8:31 AM

## 2020-10-19 NOTE — Plan of Care (Signed)
  Problem: Education: Goal: Knowledge of General Education information will improve Description: Including pain rating scale, medication(s)/side effects and non-pharmacologic comfort measures Outcome: Not Progressing   Problem: Activity: Goal: Risk for activity intolerance will decrease Outcome: Not Progressing   Problem: Pain Managment: Goal: General experience of comfort will improve Outcome: Not Progressing   Problem: Safety: Goal: Ability to remain free from injury will improve Outcome: Not Progressing   Problem: Skin Integrity: Goal: Risk for impaired skin integrity will decrease Outcome: Not Progressing

## 2020-10-19 NOTE — TOC Initial Note (Signed)
Transition of Care [facility]) - Initial/Assessment Note    Patient Details  Name: [name] MRN: [id number] Date of Birth: 1971/01/14  Transition of Care [facility]) CM/SW Contact:    Su Hilt, RN Phone Number: 10/19/2020, 2:34 PM  Clinical Narrative:    Met with the patient in the room to discuss DC plan and needs She has a PCP and transportation with her husband, She gets her meds at [facility], She wants a rolling walker, she will talk to her doctor about a power folding wheelchair , She gets dialysis [place], [place] and [place].  I explained to her that Insurance does not cover both RW and Wheelchairs, one would need to be out of pocket, she stated understanding, she is considering amputation of her toes but not decided yet.  She stated she is not sure if she wants [facility] services but will let me know, CM will continue to monitor for needs              Expected Discharge Plan: Home/Self Care Barriers to Discharge: Continued Medical Work up   Patient Goals and CMS Choice Patient states their goals for this hospitalization and ongoing recovery are:: get well      Expected Discharge Plan and Services Expected Discharge Plan: Home/Self Care   Discharge Planning Services: CM Consult   Living arrangements for the past 2 months: Single Family Home                 DME Arranged: Walker rolling DME Agency: [employer] Date DME Agency Contacted: 10/19/20 Time DME Agency Contacted: [phone] Representative spoke with at DME Agency: Suanne Marker            Prior Living Arrangements/Services Living arrangements for the past 2 months: [place] with:: Spouse                   Activities of Daily Living Home Assistive Devices/Equipment: Blood pressure cuff, Eyeglasses, Transport planner, Environmental consultant (specify type), Wheelchair, CBG Meter, Oxygen ADL Screening (condition at time of admission) [name] cognitive ability adequate to safely complete daily activities?: Yes Is the patient deaf  or have difficulty hearing?: No Does the patient have difficulty seeing, even when wearing glasses/contacts?: No Does the patient have difficulty concentrating, remembering, or making decisions?: No Patient able to express need for assistance with ADLs?: Yes Does the patient have difficulty dressing or bathing?: Yes Independently performs ADLs?: No Communication: Independent Dressing (OT): Needs assistance Is this a change from baseline?: Pre-admission baseline Grooming: Needs assistance Is this a change from baseline?: Pre-admission baseline Feeding: Independent Bathing: Needs assistance Is this a change from baseline?: Pre-admission baseline Toileting: Needs assistance Is this a change from baseline?: Pre-admission baseline In/Out Bed: Independent Walks in Home: Independent with device (comment) (rolling walker) Does the patient have difficulty walking or climbing stairs?: Yes Weakness of Legs: Both Weakness of Arms/Hands: Both  Permission Sought/Granted                  Emotional Assessment              Admission diagnosis:  Gangrene ([place]) [I96] Diabetic foot ulcer ([place]) [P94.327, L97.509] Decreased pedal pulses [R09.89] Sepsis, due to unspecified organism, unspecified whether acute organ dysfunction present [facility]) [A41.9] Patient Active Problem List   Diagnosis Date Noted   Diabetic foot ulcer ([place]) 10/15/2020   Decreased pedal pulses 10/15/2020   Chronic respiratory failure with hypoxia and hypercapnia ([place])    Extreme obesity with alveolar hypoventilation ([place])  Palliative care by specialist    Goals of care, counseling/discussion    End-stage renal disease on hemodialysis [facility])    DNR (do not resuscitate) discussion    Adjustment disorder with mixed anxiety and depressed mood 03/31/2017   Dysthymia 03/31/2017   Chronic pain syndrome 03/31/2017   Palliative care encounter    Obesity hypoventilation syndrome ([place]) 03/09/2017   Chronic diastolic heart  failure ([place]) 01/07/2017   Depression 09/24/2016   Acute on chronic diastolic heart failure ([place]) 07/04/2016   COPD with chronic bronchitis ([place]) 07/04/2016   CKD (chronic kidney disease), stage III ([place]) 06/26/2016   COPD exacerbation ([place]) 03/25/2016   Anxiety 10/02/2015   Asthma 08/30/2015   Poorly controlled type 2 diabetes mellitus ([place]) 03/13/2015   Iron deficiency anemia 02/17/2015   Obesity, morbid (HCC)    Shortness of breath    Essential hypertension    Hypertensive heart disease with congestive heart failure ([place]) 12/18/2014   Diabetes ([place]) 12/15/2014   PCP:  [name], NP Pharmacy:   [facility] [address] ([address]), [place]) [place] [address] Phone: [phone] Fax: [phone]  [facility] [address] [place] [address] Phone: [phone] Fax: [phone]     Social Determinants of Health (SDOH) Interventions    Readmission Risk Interventions No flowsheet data found.

## 2020-10-19 NOTE — Progress Notes (Signed)
PT Cancellation Note  Patient Details Name: [name] MRN: [id number] DOB: Oct 17, 1970   Cancelled Treatment:    Reason Eval/Treat Not Completed: Patient off the floor for HD.  Pt also pending possible R fifth toe amputation.  Will attempt to see pt at a future date/time as medically appropriate with PT evaluation to be held until after amputation if pt agrees to surgical intervention.     Linus Salmons PT, DPT 10/19/20, 10:58 AM

## 2020-10-19 NOTE — Progress Notes (Signed)
  Subjective:  Patient ID: [name], female    DOB: [birth year],  MRN: [id number]  States she feels groggy from the procedure she had today.  She is still considering amputation  Negative for chest pain and shortness of breath Fever: no Night sweats: no Constitutional signs: no Review of all other systems is negative Objective:   Vitals:   10/18/20 2000 10/19/20 0438  BP:  132/69  Pulse:  (!) 102  Resp:  20  Temp:  98.6 F (37 C)  SpO2: 99% 93%   General AA&O x3. Normal mood and affect.  Vascular Dorsalis pedis and posterior tibial pulses weakly palpable  Neurologic Epicritic sensation grossly reduced.  Dermatologic Necrotic fifth toe with interspace ulceration  Orthopedic: MMT 5/5 in dorsiflexion, plantarflexion, inversion, and eversion. Normal joint ROM without pain or crepitus.   Angiogram with good three-vessel runoff to foot  Assessment & Plan:  Patient was evaluated and treated and all questions answered.  Gangrene of fifth toe and osteomyelitis -Recommended amputation with her as well.  We discussed this briefly but she was clearly sedated still from her angiography in the morning.  We will continue to have discussions with her but ultimately she will require amputation to heal this. -We will continue to follow. -Earliest OR for [place] would be on Friday 8/26 with Dr. Alanda Amass, DPM  Accessible via secure chat for questions or concerns.

## 2020-10-19 NOTE — Progress Notes (Signed)
Patient tolerated 3 hrs of HD treatment, cannulated without difficulty.  UF goal of 1.5L achieved

## 2020-10-20 ENCOUNTER — Encounter
Admission: EM | Disposition: A | Discharge status: Home / Self Care | Payer: Self-pay | Source: Home / Self Care | Attending: Internal Medicine

## 2020-10-20 ENCOUNTER — Ambulatory Visit
Admission: RE | Admit: 2020-10-20 | Discharge: 2020-10-20 | Disposition: A | Discharge status: Home / Self Care | Payer: Medicare HMO | Source: Ambulatory Visit | Attending: Physician Assistant | Admitting: Physician Assistant

## 2020-10-20 DIAGNOSIS — N186 End stage renal disease: Secondary | ICD-10-CM | POA: Diagnosis not present

## 2020-10-20 DIAGNOSIS — L97513 Non-pressure chronic ulcer of other part of right foot with necrosis of muscle: Secondary | ICD-10-CM | POA: Insufficient documentation

## 2020-10-20 DIAGNOSIS — L97519 Non-pressure chronic ulcer of other part of right foot with unspecified severity: Secondary | ICD-10-CM | POA: Diagnosis not present

## 2020-10-20 DIAGNOSIS — E11621 Type 2 diabetes mellitus with foot ulcer: Secondary | ICD-10-CM | POA: Diagnosis not present

## 2020-10-20 DIAGNOSIS — A419 Sepsis, unspecified organism: Secondary | ICD-10-CM | POA: Diagnosis not present

## 2020-10-20 DIAGNOSIS — I96 Gangrene, not elsewhere classified: Secondary | ICD-10-CM

## 2020-10-20 DIAGNOSIS — R0989 Other specified symptoms and signs involving the circulatory and respiratory systems: Secondary | ICD-10-CM | POA: Diagnosis not present

## 2020-10-20 DIAGNOSIS — L97509 Non-pressure chronic ulcer of other part of unspecified foot with unspecified severity: Secondary | ICD-10-CM

## 2020-10-20 LAB — CULTURE, BLOOD (ROUTINE X 2)
Culture: NO GROWTH
Culture: NO GROWTH
Special Requests: ADEQUATE
Special Requests: ADEQUATE

## 2020-10-20 LAB — BASIC METABOLIC PANEL
Anion gap: 16 — ABNORMAL HIGH (ref 5–15)
BUN: 45 mg/dL — ABNORMAL HIGH (ref 6–20)
CO2: 25 mmol/L (ref 22–32)
Calcium: 7.8 mg/dL — ABNORMAL LOW (ref 8.9–10.3)
Chloride: 85 mmol/L — ABNORMAL LOW (ref 98–111)
Creatinine, Ser: 6.99 mg/dL — ABNORMAL HIGH (ref 0.44–1.00)
GFR, Estimated: 7 mL/min — ABNORMAL LOW (ref >60)
Glucose, Bld: 239 mg/dL — ABNORMAL HIGH (ref 70–99)
Potassium: 4.4 mmol/L (ref 3.5–5.1)
Sodium: 126 mmol/L — ABNORMAL LOW (ref 135–145)

## 2020-10-20 LAB — AEROBIC CULTURE W GRAM STAIN (SUPERFICIAL SPECIMEN)

## 2020-10-20 LAB — CBC
HCT: 26.6 % — ABNORMAL LOW (ref 36.0–46.0)
Hemoglobin: 8.6 g/dL — ABNORMAL LOW (ref 12.0–15.0)
MCH: 30.2 pg (ref 26.0–34.0)
MCHC: 32.3 g/dL (ref 30.0–36.0)
MCV: 93.3 fL (ref 80.0–100.0)
Platelets: 396 10*3/uL (ref 150–400)
RBC: 2.85 MIL/uL — ABNORMAL LOW (ref 3.87–5.11)
RDW: 13.8 % (ref 11.5–15.5)
WBC: 27.7 10*3/uL — ABNORMAL HIGH (ref 4.0–10.5)
nRBC: 0 % (ref 0.0–0.2)

## 2020-10-20 LAB — HEPATITIS B SURFACE ANTIBODY, QUANTITATIVE: Hep B S AB Quant (Post): 3.5 m[IU]/mL — ABNORMAL LOW (ref >9.9)

## 2020-10-20 LAB — GLUCOSE, CAPILLARY
Glucose-Capillary: 203 mg/dL — ABNORMAL HIGH (ref 70–99)
Glucose-Capillary: 246 mg/dL — ABNORMAL HIGH (ref 70–99)
Glucose-Capillary: 300 mg/dL — ABNORMAL HIGH (ref 70–99)
Glucose-Capillary: 290 mg/dL — ABNORMAL HIGH (ref 70–99)
Glucose-Capillary: 217 mg/dL — ABNORMAL HIGH (ref 70–99)

## 2020-10-20 LAB — PHOSPHORUS: Phosphorus: 4.5 mg/dL (ref 2.5–4.6)

## 2020-10-20 LAB — MAGNESIUM: Magnesium: 1.9 mg/dL (ref 1.7–2.4)

## 2020-10-20 SURGERY — AMPUTATION, TOE
Anesthesia: Choice | Site: Toe | Laterality: Right

## 2020-10-20 MED ORDER — SODIUM CHLORIDE 0.9 % IV SOLN
3.0000 g | Freq: Two times a day (BID) | INTRAVENOUS | Status: DC
  Administered 2020-10-20 – 2020-10-23 (×6): 3 g via INTRAVENOUS
  Filled 2020-10-20: qty 3
  Filled 2020-10-20 (×4): qty 8
  Filled 2020-10-20 (×2): qty 3
  Filled 2020-10-20: qty 8

## 2020-10-20 MED ORDER — ALPRAZOLAM 0.25 MG PO TABS
0.2500 mg | ORAL_TABLET | Freq: Two times a day (BID) | ORAL | Status: DC | PRN
  Administered 2020-10-20 – 2020-11-10 (×14): 0.25 mg via ORAL
  Filled 2020-10-20 (×14): qty 1

## 2020-10-20 MED ORDER — NEPRO/CARBSTEADY PO LIQD
237.0000 mL | Freq: Two times a day (BID) | ORAL | Status: DC
Start: 2020-10-20 — End: 2020-11-10
  Administered 2020-10-20 – 2020-11-09 (×14): 237 mL via ORAL

## 2020-10-20 NOTE — Progress Notes (Signed)
[facility] Daily Progress Note  10/18/20:             1.  Ultrasound guidance for vascular access left femoral artery             2.  Catheter placement into right SFA from left femoral approach             3.  Aortogram and selective right lower extremity angiogram             4.  StarClose closure device left femoral artery  Subjective: Patient without complaint this AM.  No acute issues overnight.    Objective: Vitals:   10/19/20 1612 10/19/20 1954 10/20/20 0424 10/20/20 0810  BP: 121/68 115/64 115/65 127/67  Pulse: (!) 101 (!) 106 98 93  Resp: 16 19 20    Temp: 99.7 F (37.6 C) 98.7 F (37.1 C) (!) 97.5 F (36.4 C) 98.4 F (36.9 C)  TempSrc:   Oral   SpO2: 97% 100% 98% 100%  Weight:      Height:        Intake/Output Summary (Last 24 hours) at 10/20/2020 1027 Last data filed at 10/20/2020 0810 Gross per 24 hour  Intake 240 ml  Output 1500 ml  Net -1260 ml   Physical Exam: A&Ox3, NAD CV: RRR Pulmonary: CTA Bilaterally Abdomen: Soft, Nontender, Nondistended Access site:  Clean dry and intact.  No swelling or drainage noted. Vascular:  Right lower extremity : Thigh soft.  Calf soft.  Warm distally to toes.  Unable to palpate pedal pulses due to body habitus.   Laboratory: CBC    Component Value Date/Time   WBC 27.7 (H) 10/20/2020 0757   HGB 8.6 (L) 10/20/2020 0757   HGB 12.4 01/11/2014 1324   HCT 26.6 (L) 10/20/2020 0757   HCT 39.2 01/11/2014 1324   PLT 396 10/20/2020 0757   PLT 370 01/11/2014 1324   BMET    Component Value Date/Time   NA 126 (L) 10/20/2020 0757   NA 135 (L) 11/21/2013 1153   K 4.4 10/20/2020 0757   K 4.1 11/21/2013 1153   CL 85 (L) 10/20/2020 0757   CL 98 11/21/2013 1153   CO2 25 10/20/2020 0757   CO2 30 11/21/2013 1153   GLUCOSE 239 (H) 10/20/2020 0757   GLUCOSE 287 (H) 11/21/2013 1153   BUN 45 (H) 10/20/2020 0757   BUN 16 11/21/2013 1153   CREATININE 6.99 (H) 10/20/2020 0757   CREATININE 0.85 11/21/2013  1153   CALCIUM 7.8 (L) 10/20/2020 0757   CALCIUM 8.7 11/21/2013 1153   GFRNONAA 7 (L) 10/20/2020 0757   GFRNONAA >60 11/21/2013 1153   GFRAA 15 (L) 04/26/2017 0609   GFRAA >60 11/21/2013 1153   Assessment/Planning: The patient is a 50 year old female with multiple medical issues including chronic nonhealing wound to the right foot status post right lower extremity angiogram - POD#2  1) Findings:   Right lower Extremity: Normal common femoral artery, profunda femoris artery, and superficial femoral artery.  The popliteal artery had a mild lesion of 30% or less just below the knee.  There was a normal tibial trifurcation with three-vessel runoff with both the anterior tibial and posterior tibial arteries being continuous to the foot without focal stenosis.  2) patient is currently on aspirin and statin for medical management  3) no issues with the functioning ability of her dialysis access at this time  4) vascular surgery to sign off at this time.  No further recommendations.  Please reconsult if anything else is needed.  Thank you.  Discussed with Dr. Ellis Parents Morris Longenecker PA-C 10/20/2020 10:27 AM

## 2020-10-20 NOTE — Progress Notes (Signed)
[facility]  ROUNDING NOTE   Subjective:   [name] was admitted to [facility] on 10/15/2020 for Gangrene [facility]) [I96] Diabetic foot ulcer ([place]) [E11.621, L97.509] Decreased pedal pulses [R09.89] Sepsis, due to unspecified organism, unspecified whether acute organ dysfunction present [name]) [A41.9]  Last hemodialysis treatment was Saturday, 8/20. She was having hypotension on her treatment and she took midodrine to complete her treatment.   Patient seen resting in bed Alert and oriented Feels like she is being rushed into a decision about how to proceed with her care.  Dialysis yesterday, tolerated well   Objective:  Vital signs in last 24 hours:  Temp:  [97.5 F (36.4 C)-99.7 F (37.6 C)] 98.4 F (36.9 C) (08/26 0810) Pulse Rate:  [47-116] 93 (08/26 0810) Resp:  [16-31] 20 (08/26 0424) BP: (92-127)/(38-73) 127/67 (08/26 0810) SpO2:  [95 %-100 %] 100 % (08/26 0810)  Weight change:  Filed Weights   10/15/20 1905 10/18/20 0928  Weight: 126.1 kg 126.1 kg    Intake/Output: I/O last 3 completed shifts: In: 240 [P.O.:240] Out: 1500 [Other:1500]   Intake/Output this shift:  No intake/output data recorded.  Physical Exam: General: NAD, laying in bed  Head: Normocephalic, atraumatic. Moist oral mucosal membranes  Eyes: Anicteric  Lungs:  Clear to auscultation, O2 2L  Heart: Regular rate and rhythm  Abdomen:  Soft, nontender  Extremities:  trace peripheral edema. Right foot with ulcer under fifth toe  Neurologic: Nonfocal, moving all four extremities  Skin: No lesions  Access: Left AVF    Basic Metabolic Panel: Recent Labs  Lab 10/16/20 1402 10/17/20 0352 10/18/20 0602 10/19/20 0526 10/20/20 0757  NA 128* 128* 130* 126* 126*  K 5.0 5.0 4.2 4.7 4.4  CL 90* 91* 89* 85* 85*  CO2 19* 21* 25 24 25   GLUCOSE 295* 194* 180* 325* 239*  BUN 60* 76* 48* 61* 45*  CREATININE 9.75* 10.68* 8.03* 8.86* 6.99*  CALCIUM 9.1 9.4 8.7* 7.8* 7.8*  MG  --  2.0  1.8 1.8 1.9     Liver Function Tests: Recent Labs  Lab 10/15/20 1958 10/16/20 0347  AST 64* 45*  ALT 59* 47*  ALKPHOS 559* 474*  BILITOT 0.7 0.9  PROT 8.3* 7.4  ALBUMIN 3.3* 2.7*    No results for input(s): LIPASE, AMYLASE in the last 168 hours. No results for input(s): AMMONIA in the last 168 hours.  CBC: Recent Labs  Lab 10/15/20 1958 10/16/20 1402 10/17/20 0352 10/18/20 0602 10/19/20 0526 10/20/20 0757  WBC 22.1* 21.4* 20.8* 20.7* 22.9* 27.7*  NEUTROABS 19.0*  --   --   --   --   --   HGB 9.8* 8.9* 9.3* 8.6* 7.9* 8.6*  HCT 30.6* 28.5* 29.4* 26.6* 24.3* 26.6*  MCV 96.5 97.6 95.1 94.0 94.9 93.3  PLT 358 325 314 328 351 396     Cardiac Enzymes: No results for input(s): CKTOTAL, CKMB, CKMBINDEX, TROPONINI in the last 168 hours.  BNP: Invalid input(s): POCBNP  CBG: Recent Labs  Lab 10/19/20 1337 10/19/20 1623 10/19/20 2223 10/20/20 0136 10/20/20 0809  GLUCAP 150* 186* 210* 67* 75*     Microbiology: Results for orders placed or performed during the hospital encounter of 10/15/20  Blood Culture (routine x 2)     Status: None   Collection Time: 10/15/20  8:03 PM   Specimen: BLOOD  Result Value Ref Range Status   Specimen Description BLOOD BLOOD RIGHT FOREARM  Final   Special Requests   Final  BOTTLES DRAWN AEROBIC AND ANAEROBIC Blood Culture adequate volume   Culture   Final    NO GROWTH 5 DAYS Performed at [facility], [address] [place], [place] [address]    Report Status 10/20/2020 FINAL  Final  Resp Panel by RT-PCR (Flu A&B, Covid) Nasopharyngeal Swab     Status: None   Collection Time: 10/15/20  8:26 PM   Specimen: Nasopharyngeal Swab; Nasopharyngeal(NP) swabs in vial transport medium  Result Value Ref Range Status   SARS Coronavirus 2 by RT PCR NEGATIVE NEGATIVE Final    Comment: (NOTE) SARS-CoV-2 target nucleic acids are NOT DETECTED.  The SARS-CoV-2 RNA is generally detectable in upper respiratory specimens during  the acute phase of infection. The lowest concentration of SARS-CoV-2 viral copies this assay can detect is 138 copies/mL. A negative result does not preclude SARS-Cov-2 infection and should not be used as the sole basis for treatment or other patient management decisions. A negative result may occur with  improper specimen collection/handling, submission of specimen other than nasopharyngeal swab, presence of viral mutation(s) within the areas targeted by this assay, and inadequate number of viral copies(<138 copies/mL). A negative result must be combined with clinical observations, patient history, and epidemiological information. The expected result is Negative.  Fact Sheet for Patients:  EntrepreneurPulse.com.au  Fact Sheet for Healthcare Providers:  IncredibleEmployment.be  This test is no t yet approved or cleared by the [place] FDA and  has been authorized for detection and/or diagnosis of SARS-CoV-2 by FDA under an Emergency Use Authorization (EUA). This EUA will remain  in effect (meaning this test can be used) for the duration of the COVID-19 declaration under Section 564(b)(1) of the Act, 21 U.S.C.section 360bbb-3(b)(1), unless the authorization is terminated  or revoked sooner.       Influenza A by PCR NEGATIVE NEGATIVE Final   Influenza B by PCR NEGATIVE NEGATIVE Final    Comment: (NOTE) The Xpert Xpress SARS-CoV-2/FLU/RSV plus assay is intended as an aid in the diagnosis of influenza from Nasopharyngeal swab specimens and should not be used as a sole basis for treatment. Nasal washings and aspirates are unacceptable for Xpert Xpress SARS-CoV-2/FLU/RSV testing.  Fact Sheet for Patients: EntrepreneurPulse.com.au  Fact Sheet for Healthcare Providers: IncredibleEmployment.be  This test is not yet approved or cleared by the [place] FDA and has been authorized for detection and/or  diagnosis of SARS-CoV-2 by FDA under an Emergency Use Authorization (EUA). This EUA will remain in effect (meaning this test can be used) for the duration of the COVID-19 declaration under Section 564(b)(1) of the Act, 21 U.S.C. section 360bbb-3(b)(1), unless the authorization is terminated or revoked.  Performed at [facility], [address] [place], [place] [address]   Blood Culture (routine x 2)     Status: None   Collection Time: 10/15/20  8:26 PM   Specimen: BLOOD  Result Value Ref Range Status   Specimen Description BLOOD RIGHT ANTECUBITAL  Final   Special Requests   Final    BOTTLES DRAWN AEROBIC AND ANAEROBIC Blood Culture adequate volume   Culture   Final    NO GROWTH 5 DAYS Performed at [facility], [address], [place], [place] [address]    Report Status 10/20/2020 FINAL  Final  Aerobic Culture w Gram Stain (superficial specimen)     Status: None (Preliminary result)   Collection Time: 10/17/20  8:52 PM   Specimen: Wound  Result Value Ref Range Status   Specimen Description  Final    WOUND Performed at [facility], [address] [place], [place] [address]    Special Requests   Final    NONE Performed at [facility], [address] [place], [place] [address]    Gram Stain   Final    RARE SQUAMOUS EPITHELIAL CELLS PRESENT FEW WBC PRESENT, PREDOMINANTLY MONONUCLEAR ABUNDANT GRAM VARIABLE ROD FEW GRAM POSITIVE COCCI    Culture   Final    MODERATE ENTEROCOCCUS FAECALIS MODERATE KLEBSIELLA OXYTOCA SUSCEPTIBILITIES TO FOLLOW ABUNDANT DIPHTHEROIDS(CORYNEBACTERIUM SPECIES) Standardized susceptibility testing for this organism is not available. Performed at [facility], [place] [address], [place],  [address]    Report Status PENDING  Incomplete    Coagulation Studies: No results for input(s): LABPROT, INR in the last 72 hours.   Urinalysis: No results for input(s): COLORURINE, LABSPEC, PHURINE,  GLUCOSEU, HGBUR, BILIRUBINUR, KETONESUR, PROTEINUR, UROBILINOGEN, NITRITE, LEUKOCYTESUR in the last 72 hours.  Invalid input(s): APPERANCEUR    Imaging: PERIPHERAL VASCULAR CATHETERIZATION  Result Date: 10/18/2020 See surgical note for result.    Medications:    sodium chloride Stopped (10/16/20 1111)   piperacillin-tazobactam (ZOSYN)  IV      aspirin EC  81 mg Oral Daily   atorvastatin  80 mg Oral q1800   Chlorhexidine Gluconate Cloth  6 each Topical Daily   cholecalciferol  4,000 Units Oral Weekly   cinacalcet  60 mg Oral Daily   docusate sodium  100 mg Oral Daily   ferric citrate  210 mg Oral TID WC   heparin  5,000 Units Subcutaneous Q8H   insulin aspart  0-5 Units Subcutaneous QHS   insulin aspart  0-9 Units Subcutaneous TID WC   insulin aspart  2 Units Subcutaneous TID WC   insulin glargine-yfgn  36 Units Subcutaneous QHS   linaclotide  290 mcg Oral q morning   mometasone-formoterol  2 puff Inhalation BID   pantoprazole  40 mg Oral Daily   sodium chloride flush  3 mL Intravenous Q12H   sodium chloride, acetaminophen, albuterol, albuterol, alum & mag hydroxide-simeth, calcium carbonate, dextromethorphan-guaiFENesin, hydrALAZINE, HYDROmorphone (DILAUDID) injection, ipratropium-albuterol, lactulose, metoprolol tartrate, ondansetron (ZOFRAN) IV, ondansetron, oxyCODONE, polyethylene glycol, sodium chloride flush, traZODone  Assessment/ Plan:  [name] is a 50 y.o. black female with end stage renal disease on hemodialysis, diabetes mellitus type II, hypertension, congestive heart failure, COPD, who is admitted to [facility] on 10/15/2020 for Gangrene [facility]) [I96] Diabetic foot ulcer ([place]) [O96.295, L97.509] Decreased pedal pulses [R09.89] Sepsis, due to unspecified organism, unspecified whether acute organ dysfunction present [facility]) [A41.9]  [facility] Left AVF 127.5kg  End Stage Renal Disease: last dialysis was 10/14/20. Received dialysis yesterday, UF goal  1.5L achieved.  Tolerated well. Next treatment scheduled for Saturday  Hypotension: secondary to infection. Holding home regimen torsemide, losartan, clonidine. 127/67  Anemia with chronic kidney disease: hemoglobin 8.6. Low dose EPO with next HD treatment.   Secondary Hyperparathyroidism with hyeprphosphatemia: there is risk for calciphylaxis. Outpatinet labs on 8/9: phos 10.6, calcium 8.2, PTH 1456.  - resume  cinacalcet and sevelamer with meals - Phosphorus at goal 4.5.   Diabetes mellitus type II with chronic kidney disease: insulin dependent. Complicated with diabetic foot ulcer. History of poor control. Hemoglobin A1c 9.2% on 09/19/2020.  - Appreciate podiatry and vascular input.  - empiric ceftriaxone and metronidazole.  - Vascular performed right lower angiogram today. 30% mild lesion found popliteal artery. Deemed adequate for wound healing.  - Podiatry recommending  amputation, patient would like to entertain other options     LOS: 5 Debralee Braaksma 8/26/20229:59 AM

## 2020-10-20 NOTE — Progress Notes (Signed)
Pt removed telemetry box stating that the wires are causing her anxiety and the electrodes were causing her to itch.  Pt educated on the reasons why she has telemetry, including that her oxygen saturations are also being monitored.  Despite education, pt continued to refuse telemetry.  CCMD and on call provider, Dr. Sidney Ace made aware.

## 2020-10-20 NOTE — Plan of Care (Signed)

## 2020-10-20 NOTE — Progress Notes (Signed)
Spoke with patient and husband this evening at bedside. Patient is amenable to amputation of the RT 5th toe. All patient questions answered. Scheduled for tomorrow around noon. NPO after midnight. Preop orders placed this AM.   Edrick Kins, DPM [facility]  Dr. Edrick Kins, DPM    [address], [place] [address]                Office [phone]  Fax [phone]

## 2020-10-20 NOTE — Progress Notes (Signed)
Brief Nutrition Note  RD received call from kitchen staff reporting that patient was frustrated with diet order changes and unsure of what her diet needed to be.  RD reviewed pt's chart and talked with attending. Given pt's history of T2DM, pt's diet order made to be Carbohydrate Modified.  Labs and medications reviewed. Electrolytes WNL, therefore pt does not need renal diet modification at this time.  No further nutrition intervention needed at this time. If need arises, please consult RD.  Derrel Nip, RD, LDN (she/her/hers) Registered Dietitian I After-Hours/Weekend Pager # in [place]

## 2020-10-20 NOTE — Progress Notes (Signed)
PT Cancellation Note  Patient Details Name: [name] MRN: [id number] DOB: 10/28/1970   Cancelled Treatment:    Reason Eval/Treat Not Completed: Patient declined to participate with PT services this date secondary to fatigue, anxiety, and R foot pain.  Will attempt to see pt at a future date/time as medically appropriate.    Linus Salmons PT, DPT 10/20/20, 11:18 AM

## 2020-10-20 NOTE — Progress Notes (Signed)
Inpatient Diabetes Program Recommendations  AACE/ADA: New Consensus Statement on Inpatient Glycemic Control (2015)  Target Ranges:  Prepandial:   less than 140 mg/dL      Peak postprandial:   less than 180 mg/dL (1-2 hours)      Critically ill patients:  140 - 180 mg/dL  Results for [name], [name] (MRN [id number]) as of 10/20/2020 09:55  Ref. Range 10/19/2020 08:18 10/19/2020 13:37 10/19/2020 16:23 10/19/2020 22:23  Glucose-Capillary Latest Ref Range: 70 - 99 mg/dL 334 (H) 150 (H) 186 (H) 210 (H)  Results for [name], [name] (MRN [id number]) as of 10/20/2020 09:55  Ref. Range 10/20/2020 08:09  Glucose-Capillary Latest Ref Range: 70 - 99 mg/dL 246 (H)     Home DM Meds: Novolog 0-15 units TID per SSI       Tresiba 36 units Daily       Ozempic 1 mg Qweek (NOT taking)  Current Orders: Novolog Sensitive Correction Scale/ SSI (0-9 units) TID AC + HS     Novolog 2 units TID with meals     Semglee 36 units QHS    MD- Note AM CBGs have been elevated the last 2 days.    Please consider increasing Semglee slightly to 40 units QHS    --Will follow patient during hospitalization--  Wyn Quaker RN, MSN, CDE Diabetes Coordinator Inpatient Glycemic Control Team Team Pager: [phone] (8a-5p)

## 2020-10-20 NOTE — Progress Notes (Signed)
PROGRESS NOTE    [name]  IWL:[id number] DOB: November 09, 1970 DOA: 10/15/2020 PCP: [name], NP   Assessment & Plan:   Principal Problem:   Diabetic foot ulcer ([place]) Active Problems:   Diabetes ([place])   Essential hypertension   DNR (do not resuscitate) discussion   End-stage renal disease on hemodialysis ([place])   Chronic respiratory failure with hypoxia and hypercapnia (HCC)   Decreased pedal pulses   Diabetic foot ulcer: w/ gangrene of 5th toe & osteomyelitis. Continue on IV unasyn as per ID. Wound cx growing enterococcus faecalis, klebsiella. Oxycodone prn for pain. Initially refused 5th toe amputation but now agreeable, made podiatry aware   Possible PVD: has decreased pedal pulses. ABI shows moderate occlusive disease bilateral lower extremity. S/p angiogram 8/24 which showed normal common femoral artery, profunda femoris artery, superficial femoral artery & popliteal artery had mild lesion of 30% or < just below the knee.   Leukocytosis: secondary to above infection and trending up. Needs toe amputation. Continue on IV abxs  DM2: poorly controlled w/ HbA1c 8.9. Continue on glargine, SSI w/ accuchecks   ESRD: on HD. Management per nephro   ACD: likely secondary to ESRD. No need for a transfusion currently   Hyponatremia: managed w/ HD as per nephro   Chronic hypoxia: continue on supplemental oxygen, uses 3L [place]  Morbid obesity: BMI 46.9. Complicates overall care and prognosis       DVT prophylaxis: heparin  Code Status: full  Family Communication:  Disposition Plan: unclear, likely d/c back home   Level of care: Med-Surg  Status is: Inpatient  Remains inpatient appropriate because:Ongoing diagnostic testing needed not appropriate for outpatient work up, IV treatments appropriate due to intensity of illness or inability to take PO, and Inpatient level of care appropriate due to severity of illness  Dispo: The patient is from: Home               Anticipated d/c is to: Home              Patient currently is not medically stable to d/c.   Difficult to place patient : unclear     Consultants:  Vascular surg ID  Procedures:   Antimicrobials: unasyn   Subjective: Pt c/o right foot pain still   Objective: Vitals:   10/19/20 1325 10/19/20 1612 10/19/20 1954 10/20/20 0424  BP: 116/69 121/68 115/64 115/65  Pulse: 100 (!) 101 (!) 106 98  Resp: 18 16 19 20   Temp: 98.9 F (37.2 C) 99.7 F (37.6 C) 98.7 F (37.1 C) (!) 97.5 F (36.4 C)  TempSrc:    Oral  SpO2: 95% 97% 100% 98%  Weight:      Height:        Intake/Output Summary (Last 24 hours) at 10/20/2020 0754 Last data filed at 10/20/2020 0300 Gross per 24 hour  Intake 240 ml  Output 1500 ml  Net -1260 ml    Filed Weights   10/15/20 1905 10/18/20 0928  Weight: 126.1 kg 126.1 kg    Examination:  General exam: Appears calm but uncomfortable. Morbidly obese Respiratory system: diminished breath sounds b/l  Cardiovascular system: S1 & S2+. No rubs or clicks  Gastrointestinal system: Abd is soft, NT, obese & hypoactive bowel sounds  Central nervous system: Awake and oriented. Moves all extremities  Psychiatry: Judgement and insight appear normal. Flat mood and affect    Data Reviewed: I have personally reviewed following labs and imaging studies  CBC: Recent Labs  Lab  10/15/20 1958 10/16/20 1402 10/17/20 0352 10/18/20 0602 10/19/20 0526  WBC 22.1* 21.4* 20.8* 20.7* 22.9*  NEUTROABS 19.0*  --   --   --   --   HGB 9.8* 8.9* 9.3* 8.6* 7.9*  HCT 30.6* 28.5* 29.4* 26.6* 24.3*  MCV 96.5 97.6 95.1 94.0 94.9  PLT 358 325 314 328 188   Basic Metabolic Panel: Recent Labs  Lab 10/16/20 0347 10/16/20 1402 10/17/20 0352 10/18/20 0602 10/19/20 0526  NA 132* 128* 128* 130* 126*  K 4.5 5.0 5.0 4.2 4.7  CL 94* 90* 91* 89* 85*  CO2 24 19* 21* 25 24  GLUCOSE 217* 295* 194* 180* 325*  BUN 55* 60* 76* 48* 61*  CREATININE 9.18* 9.75* 10.68* 8.03* 8.86*   CALCIUM 9.1 9.1 9.4 8.7* 7.8*  MG  --   --  2.0 1.8 1.8   GFR: Estimated Creatinine Clearance: 10.2 mL/min (A) (by C-G formula based on SCr of 8.86 mg/dL (H)). Liver Function Tests: Recent Labs  Lab 10/15/20 1958 10/16/20 0347  AST 64* 45*  ALT 59* 47*  ALKPHOS 559* 474*  BILITOT 0.7 0.9  PROT 8.3* 7.4  ALBUMIN 3.3* 2.7*   No results for input(s): LIPASE, AMYLASE in the last 168 hours. No results for input(s): AMMONIA in the last 168 hours. Coagulation Profile: Recent Labs  Lab 10/15/20 1958  INR 1.2   Cardiac Enzymes: No results for input(s): CKTOTAL, CKMB, CKMBINDEX, TROPONINI in the last 168 hours. BNP (last 3 results) No results for input(s): PROBNP in the last 8760 hours. HbA1C: No results for input(s): HGBA1C in the last 72 hours. CBG: Recent Labs  Lab 10/19/20 0818 10/19/20 1337 10/19/20 1623 10/19/20 2223 10/20/20 0136  GLUCAP 334* 150* 186* 210* 203*   Lipid Profile: No results for input(s): CHOL, HDL, LDLCALC, TRIG, CHOLHDL, LDLDIRECT in the last 72 hours. Thyroid Function Tests: No results for input(s): TSH, T4TOTAL, FREET4, T3FREE, THYROIDAB in the last 72 hours. Anemia Panel: No results for input(s): VITAMINB12, FOLATE, FERRITIN, TIBC, IRON, RETICCTPCT in the last 72 hours. Sepsis Labs: Recent Labs  Lab 10/15/20 1958 10/16/20 0500 10/17/20 0352  PROCALCITON  --  3.26 4.86  LATICACIDVEN 1.8  --   --     Recent Results (from the past 240 hour(s))  Blood Culture (routine x 2)     Status: None   Collection Time: 10/15/20  8:03 PM   Specimen: BLOOD  Result Value Ref Range Status   Specimen Description BLOOD BLOOD RIGHT FOREARM  Final   Special Requests   Final    BOTTLES DRAWN AEROBIC AND ANAEROBIC Blood Culture adequate volume   Culture   Final    NO GROWTH 5 DAYS Performed at [facility], [address] [place], [place] [address]    Report Status 10/20/2020 FINAL  Final  Resp Panel by RT-PCR (Flu A&B, Covid) Nasopharyngeal  Swab     Status: None   Collection Time: 10/15/20  8:26 PM   Specimen: Nasopharyngeal Swab; Nasopharyngeal(NP) swabs in vial transport medium  Result Value Ref Range Status   SARS Coronavirus 2 by RT PCR NEGATIVE NEGATIVE Final    Comment: (NOTE) SARS-CoV-2 target nucleic acids are NOT DETECTED.  The SARS-CoV-2 RNA is generally detectable in upper respiratory specimens during the acute phase of infection. The lowest concentration of SARS-CoV-2 viral copies this assay can detect is 138 copies/mL. A negative result does not preclude SARS-Cov-2 infection and should not be used as the sole basis for treatment or other  patient management decisions. A negative result may occur with  improper specimen collection/handling, submission of specimen other than nasopharyngeal swab, presence of viral mutation(s) within the areas targeted by this assay, and inadequate number of viral copies(<138 copies/mL). A negative result must be combined with clinical observations, patient history, and epidemiological information. The expected result is Negative.  Fact Sheet for Patients:  EntrepreneurPulse.com.au  Fact Sheet for Healthcare Providers:  IncredibleEmployment.be  This test is no t yet approved or cleared by the [place] FDA and  has been authorized for detection and/or diagnosis of SARS-CoV-2 by FDA under an Emergency Use Authorization (EUA). This EUA will remain  in effect (meaning this test can be used) for the duration of the COVID-19 declaration under Section 564(b)(1) of the Act, 21 U.S.C.section 360bbb-3(b)(1), unless the authorization is terminated  or revoked sooner.       Influenza A by PCR NEGATIVE NEGATIVE Final   Influenza B by PCR NEGATIVE NEGATIVE Final    Comment: (NOTE) The Xpert Xpress SARS-CoV-2/FLU/RSV plus assay is intended as an aid in the diagnosis of influenza from Nasopharyngeal swab specimens and should not be used as a sole  basis for treatment. Nasal washings and aspirates are unacceptable for Xpert Xpress SARS-CoV-2/FLU/RSV testing.  Fact Sheet for Patients: EntrepreneurPulse.com.au  Fact Sheet for Healthcare Providers: IncredibleEmployment.be  This test is not yet approved or cleared by the [place] FDA and has been authorized for detection and/or diagnosis of SARS-CoV-2 by FDA under an Emergency Use Authorization (EUA). This EUA will remain in effect (meaning this test can be used) for the duration of the COVID-19 declaration under Section 564(b)(1) of the Act, 21 U.S.C. section 360bbb-3(b)(1), unless the authorization is terminated or revoked.  Performed at [facility], [address] [place], [place] [address]   Blood Culture (routine x 2)     Status: None   Collection Time: 10/15/20  8:26 PM   Specimen: BLOOD  Result Value Ref Range Status   Specimen Description BLOOD RIGHT ANTECUBITAL  Final   Special Requests   Final    BOTTLES DRAWN AEROBIC AND ANAEROBIC Blood Culture adequate volume   Culture   Final    NO GROWTH 5 DAYS Performed at [facility], [address] [place], [place] [address]    Report Status 10/20/2020 FINAL  Final  Aerobic Culture w Gram Stain (superficial specimen)     Status: None (Preliminary result)   Collection Time: 10/17/20  8:52 PM   Specimen: Wound  Result Value Ref Range Status   Specimen Description   Final    WOUND Performed at [facility], [address], [place], [place] [address]    Special Requests   Final    NONE Performed at [facility], [address] [place], [place] [address]    Gram Stain   Final    RARE SQUAMOUS EPITHELIAL CELLS PRESENT FEW WBC PRESENT, PREDOMINANTLY MONONUCLEAR ABUNDANT GRAM VARIABLE ROD FEW GRAM POSITIVE COCCI    Culture   Final    MODERATE ENTEROCOCCUS FAECALIS MODERATE KLEBSIELLA OXYTOCA SUSCEPTIBILITIES TO FOLLOW ABUNDANT  DIPHTHEROIDS(CORYNEBACTERIUM SPECIES) Standardized susceptibility testing for this organism is not available. Performed at [facility], [place] [address] [place], [place] [address]    Report Status PENDING  Incomplete         Radiology Studies: PERIPHERAL VASCULAR CATHETERIZATION  Result Date: 10/18/2020 See surgical note for result.       Scheduled Meds:  aspirin EC  81 mg  Oral Daily   atorvastatin  80 mg Oral q1800   Chlorhexidine Gluconate Cloth  6 each Topical Daily   cholecalciferol  4,000 Units Oral Weekly   cinacalcet  60 mg Oral Daily   docusate sodium  100 mg Oral Daily   ferric citrate  210 mg Oral TID WC   heparin  5,000 Units Subcutaneous Q8H   insulin aspart  0-5 Units Subcutaneous QHS   insulin aspart  0-9 Units Subcutaneous TID WC   insulin aspart  2 Units Subcutaneous TID WC   insulin glargine-yfgn  36 Units Subcutaneous QHS   linaclotide  290 mcg Oral q morning   mometasone-formoterol  2 puff Inhalation BID   pantoprazole  40 mg Oral Daily   sodium chloride flush  3 mL Intravenous Q12H   Continuous Infusions:  sodium chloride Stopped (10/16/20 1111)   piperacillin-tazobactam (ZOSYN)  IV       LOS: 5 days    Time spent: 35 mins     Wyvonnia Dusky, MD [facility] Pager [phone]  If 7PM-7AM, please contact night-coverage 10/20/2020, 7:54 AM

## 2020-10-20 NOTE — Progress Notes (Signed)
Spoke with patient this morning via telephone regarding right toe amputation.  Patient refuses surgery today.  She states that she would like to continue to think and pray about it.    Assessment/plan of care:  RT fifth toe gangrene with infection webspace   -Continue wound care.   -Explained to the patient the risks and benefits of the amputation of the toe and eradicating the source of infection.If the patient continues to refuse surgery from a podiatry standpoint she may follow-up outpatient.   -NPO canceled  -will round on patient this weekend and readdress  Edrick Kins, DPM [facility]  Dr. Edrick Kins, DPM    [address], [place] [address]                Office [phone]  Fax [phone]

## 2020-10-20 NOTE — Progress Notes (Signed)
ID Pt was sleeping when I went to her room Patient Vitals for the past 24 hrs:  BP Temp Temp src Pulse Resp SpO2  10/20/20 1509 (!) 109/57 98.4 F (36.9 C) -- 96 -- 95 %  10/20/20 1223 130/62 98.2 F (36.8 C) -- 98 -- 97 %  10/20/20 0810 127/67 98.4 F (36.9 C) -- 93 -- 100 %  10/20/20 0424 115/65 (!) 97.5 F (36.4 C) Oral 98 20 98 %  10/19/20 1954 115/64 98.7 F (37.1 C) -- (!) 106 19 100 %     Labs CBC Latest Ref Rng & Units 10/20/2020 10/19/2020 10/18/2020  WBC 4.0 - 10.5 K/uL 27.7(H) 22.9(H) 20.7(H)  Hemoglobin 12.0 - 15.0 g/dL 8.6(L) 7.9(L) 8.6(L)  Hematocrit 36.0 - 46.0 % 26.6(L) 24.3(L) 26.6(L)  Platelets 150 - 400 K/uL 396 351 328    CMP Latest Ref Rng & Units 10/20/2020 10/19/2020 10/18/2020  Glucose 70 - 99 mg/dL 239(H) 325(H) 180(H)  BUN 6 - 20 mg/dL 45(H) 61(H) 48(H)  Creatinine 0.44 - 1.00 mg/dL 6.99(H) 8.86(H) 8.03(H)  Sodium 135 - 145 mmol/L 126(L) 126(L) 130(L)  Potassium 3.5 - 5.1 mmol/L 4.4 4.7 4.2  Chloride 98 - 111 mmol/L 85(L) 85(L) 89(L)  CO2 22 - 32 mmol/L 25 24 25   Calcium 8.9 - 10.3 mg/dL 7.8(L) 7.8(L) 8.7(L)  Total Protein 6.5 - 8.1 g/dL - - -  Total Bilirubin 0.3 - 1.2 mg/dL - - -  Alkaline Phos 38 - 126 U/L - - -  AST 15 - 41 U/L - - -  ALT 0 - 44 U/L - - -    Impression/recommendation- worsening necrosis /gangrene rt 5th toe -on Iv vanco and cefepime- no change in status- infact leucocytosis worsening due to gangrene. Superficial culture from the web space was enterococus and kleb- no MRSA or pseudomonas- so Will DC the two antibiotics and start unasyn Podiatry saw her after and talked to her and she has agreed to undergo amputation tomorrow

## 2020-10-21 ENCOUNTER — Inpatient Hospital Stay: Payer: Medicare HMO

## 2020-10-21 ENCOUNTER — Inpatient Hospital Stay: Payer: Medicare HMO | Admitting: Anesthesiology

## 2020-10-21 ENCOUNTER — Encounter
Admission: EM | Disposition: A | Discharge status: Home / Self Care | Payer: Self-pay | Source: Home / Self Care | Attending: Internal Medicine

## 2020-10-21 DIAGNOSIS — L97519 Non-pressure chronic ulcer of other part of right foot with unspecified severity: Secondary | ICD-10-CM | POA: Diagnosis not present

## 2020-10-21 DIAGNOSIS — E11621 Type 2 diabetes mellitus with foot ulcer: Secondary | ICD-10-CM | POA: Diagnosis not present

## 2020-10-21 DIAGNOSIS — L97518 Non-pressure chronic ulcer of other part of right foot with other specified severity: Secondary | ICD-10-CM

## 2020-10-21 DIAGNOSIS — I96 Gangrene, not elsewhere classified: Secondary | ICD-10-CM

## 2020-10-21 DIAGNOSIS — N186 End stage renal disease: Secondary | ICD-10-CM | POA: Diagnosis not present

## 2020-10-21 HISTORY — PX: AMPUTATION TOE: SHX6595

## 2020-10-21 LAB — CBC
HCT: 25.7 % — ABNORMAL LOW (ref 36.0–46.0)
Hemoglobin: 8.3 g/dL — ABNORMAL LOW (ref 12.0–15.0)
MCH: 29.7 pg (ref 26.0–34.0)
MCHC: 32.3 g/dL (ref 30.0–36.0)
MCV: 92.1 fL (ref 80.0–100.0)
Platelets: 399 10*3/uL (ref 150–400)
RBC: 2.79 MIL/uL — ABNORMAL LOW (ref 3.87–5.11)
RDW: 13.8 % (ref 11.5–15.5)
WBC: 31.1 10*3/uL — ABNORMAL HIGH (ref 4.0–10.5)
nRBC: 0.1 % (ref 0.0–0.2)

## 2020-10-21 LAB — BASIC METABOLIC PANEL
Anion gap: 16 — ABNORMAL HIGH (ref 5–15)
BUN: 57 mg/dL — ABNORMAL HIGH (ref 6–20)
CO2: 23 mmol/L (ref 22–32)
Calcium: 7.8 mg/dL — ABNORMAL LOW (ref 8.9–10.3)
Chloride: 87 mmol/L — ABNORMAL LOW (ref 98–111)
Creatinine, Ser: 8.04 mg/dL — ABNORMAL HIGH (ref 0.44–1.00)
GFR, Estimated: 6 mL/min — ABNORMAL LOW (ref >60)
Glucose, Bld: 126 mg/dL — ABNORMAL HIGH (ref 70–99)
Potassium: 5 mmol/L (ref 3.5–5.1)
Sodium: 126 mmol/L — ABNORMAL LOW (ref 135–145)

## 2020-10-21 LAB — GLUCOSE, CAPILLARY
Glucose-Capillary: 127 mg/dL — ABNORMAL HIGH (ref 70–99)
Glucose-Capillary: 141 mg/dL — ABNORMAL HIGH (ref 70–99)
Glucose-Capillary: 155 mg/dL — ABNORMAL HIGH (ref 70–99)
Glucose-Capillary: 212 mg/dL — ABNORMAL HIGH (ref 70–99)

## 2020-10-21 LAB — MAGNESIUM
Magnesium: 2 mg/dL (ref 1.7–2.4)
Magnesium: 2.1 mg/dL (ref 1.7–2.4)

## 2020-10-21 IMAGING — DX DG FOOT COMPLETE 3+V*R*
3 series · 3 of 3 positions shown · non-contrast
Comparison: None.

CLINICAL DATA: Status post fifth metatarsal amputation.

EXAM:
RIGHT FOOT COMPLETE - 3+ VIEW

[foot ap]
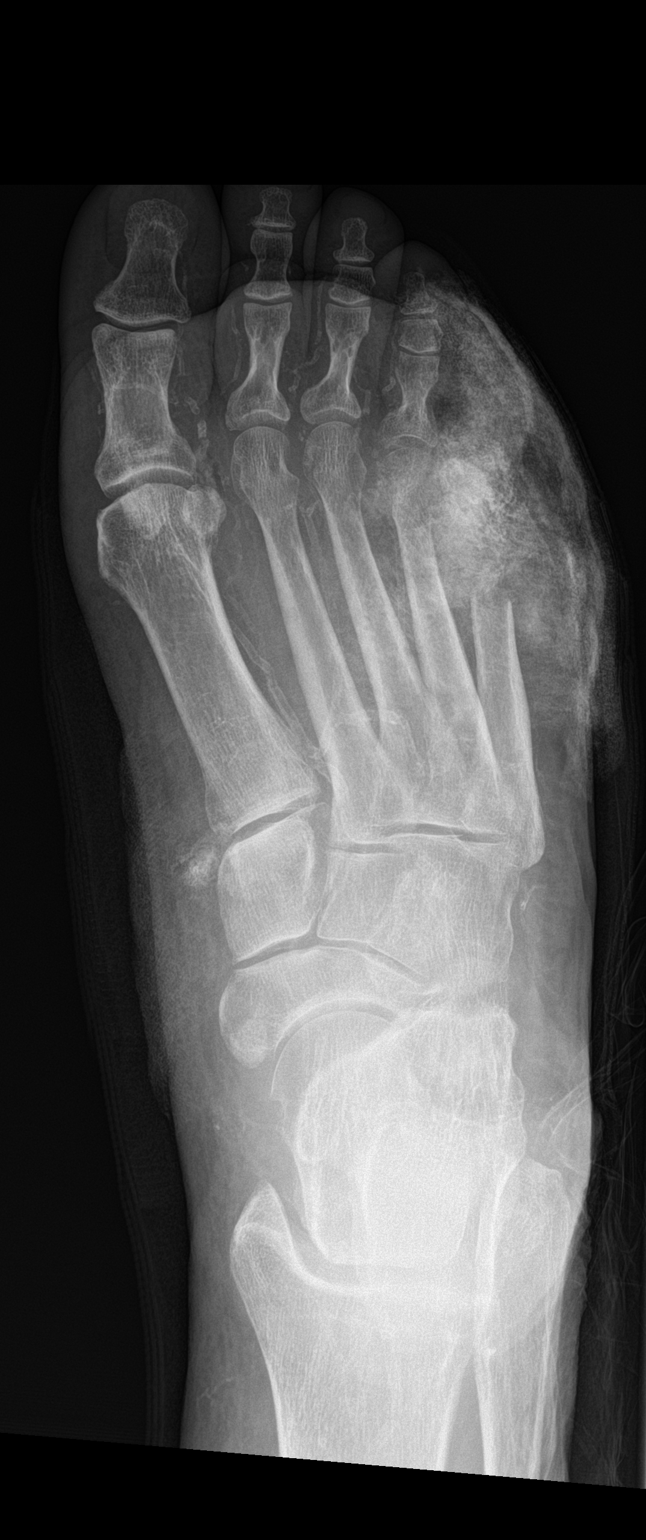

[foot obl]
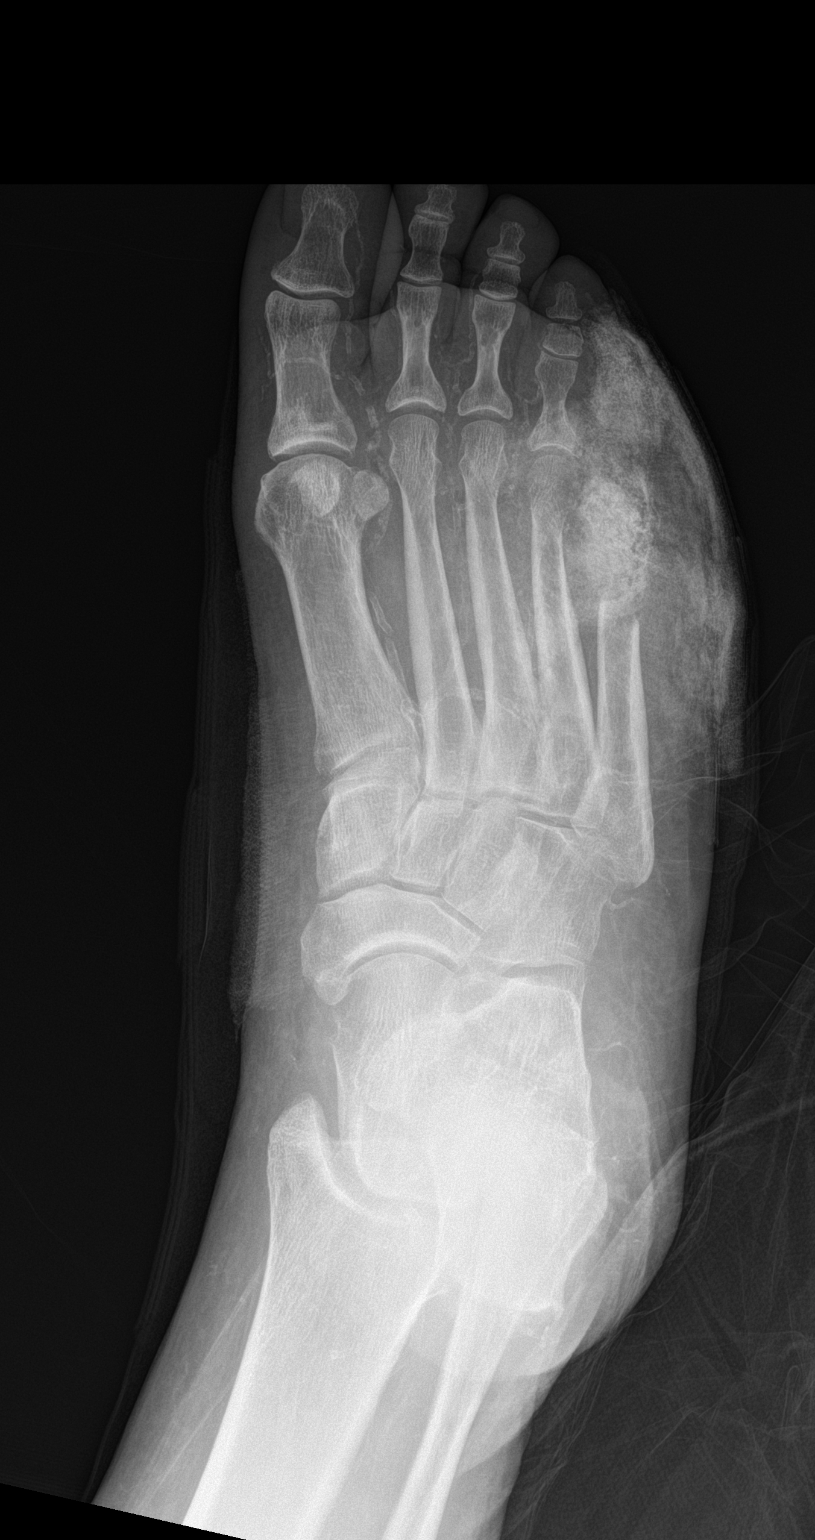

[foot lat]
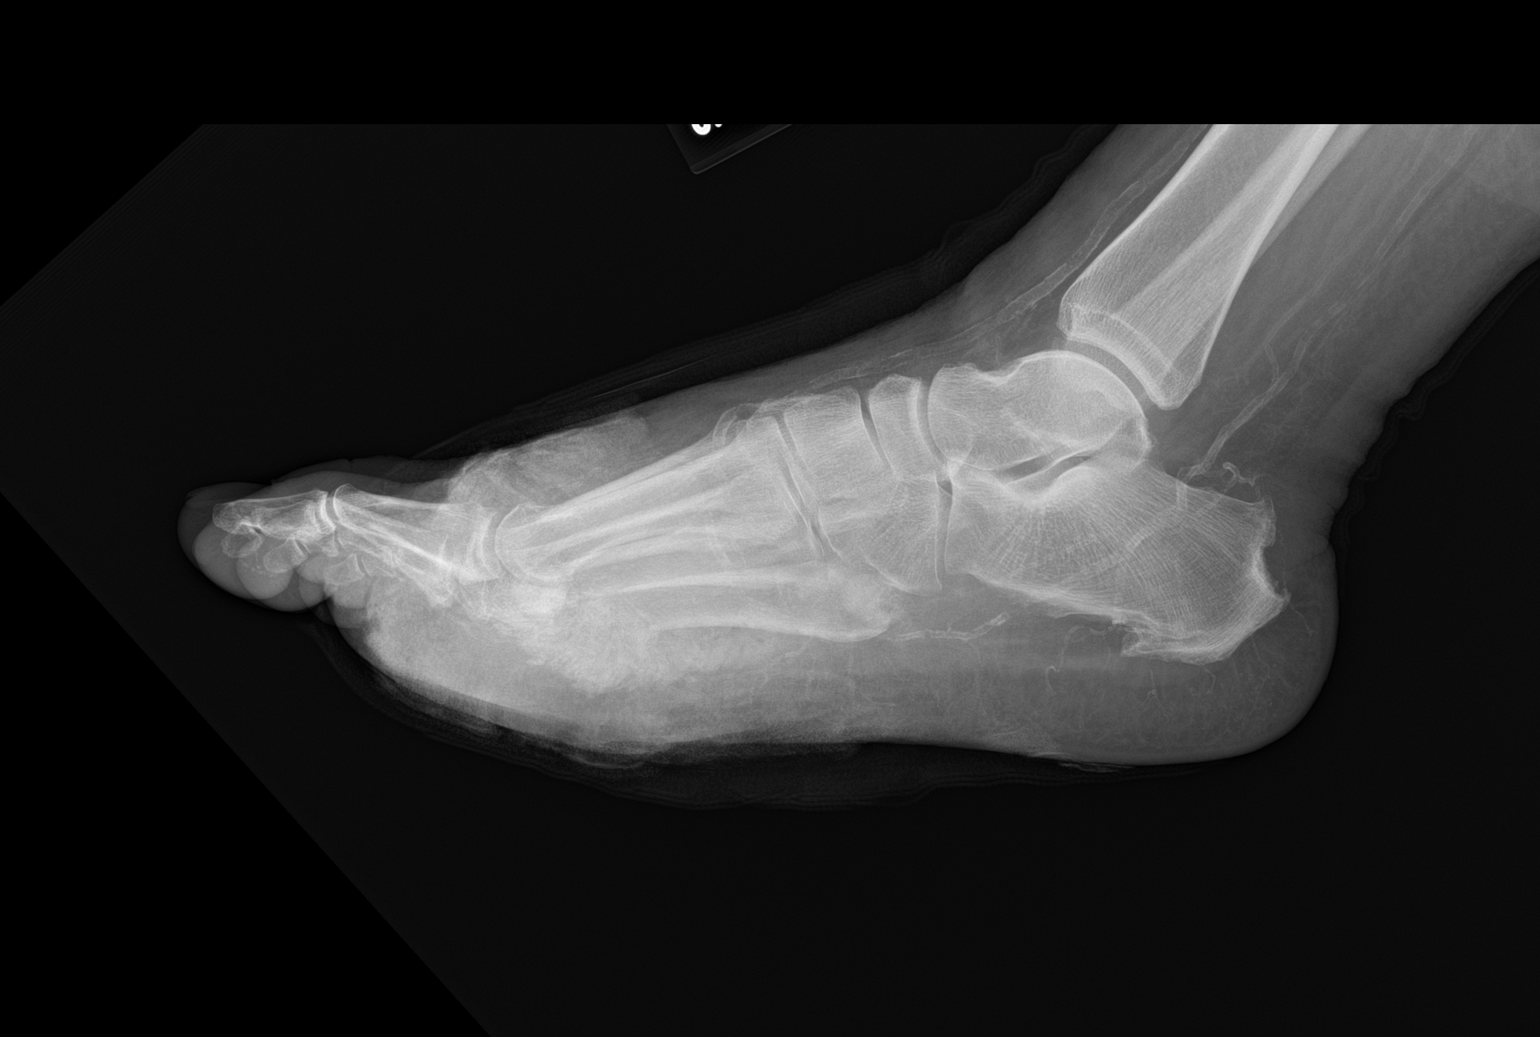

[3 of 3 positions shown; findings below may reference images not displayed]

FINDINGS: The patient is status post amputation of the distal fifth metatarsal
and the fifth toe. Overlying dressings limit evaluation of fine
cortical detail in these regions. No other acute abnormalities.
Vascular calcifications are identified.
IMPRESSION: Patient is status post amputation of the distal fifth metatarsal and
the fifth toe.

## 2020-10-21 SURGERY — AMPUTATION, TOE
Anesthesia: General | Site: Toe | Laterality: Right

## 2020-10-21 MED ORDER — LIDOCAINE HCL 1 % IJ SOLN
INTRAMUSCULAR | Status: DC | PRN
Start: 2020-10-21 — End: 2020-10-21
  Administered 2020-10-21: 10 mL

## 2020-10-21 MED ORDER — OXYCODONE HCL 5 MG/5ML PO SOLN
5.0000 mg | Freq: Once | ORAL | Status: DC | PRN
Start: 2020-10-21 — End: 2020-10-21

## 2020-10-21 MED ORDER — FENTANYL CITRATE (PF) 100 MCG/2ML IJ SOLN: 25.0000 ug | INTRAMUSCULAR | Status: DC | PRN

## 2020-10-21 MED ORDER — KETAMINE HCL 50 MG/ML IJ SOLN
INTRAMUSCULAR | Status: AC
  Filled 2020-10-21: qty 1

## 2020-10-21 MED ORDER — GLYCOPYRROLATE 0.2 MG/ML IJ SOLN
INTRAMUSCULAR | Status: DC | PRN
  Administered 2020-10-21: .2 mg via INTRAVENOUS

## 2020-10-21 MED ORDER — FENTANYL CITRATE (PF) 100 MCG/2ML IJ SOLN
INTRAMUSCULAR | Status: DC | PRN
  Administered 2020-10-21 (×2): 25 ug via INTRAVENOUS

## 2020-10-21 MED ORDER — PROPOFOL 10 MG/ML IV BOLUS
INTRAVENOUS | Status: DC | PRN
  Administered 2020-10-21: 40 mg via INTRAVENOUS

## 2020-10-21 MED ORDER — BUPIVACAINE HCL 0.5 % IJ SOLN
INTRAMUSCULAR | Status: DC | PRN
  Administered 2020-10-21: 10 mL

## 2020-10-21 MED ORDER — ONDANSETRON HCL 4 MG/2ML IJ SOLN
INTRAMUSCULAR | Status: DC | PRN
  Administered 2020-10-21: 4 mg via INTRAVENOUS

## 2020-10-21 MED ORDER — PROPOFOL 500 MG/50ML IV EMUL
INTRAVENOUS | Status: DC | PRN
  Administered 2020-10-21: 100 ug/kg/min via INTRAVENOUS

## 2020-10-21 MED ORDER — KETAMINE HCL 10 MG/ML IJ SOLN
INTRAMUSCULAR | Status: DC | PRN
  Administered 2020-10-21: 50 mg via INTRAVENOUS

## 2020-10-21 MED ORDER — LORAZEPAM 2 MG/ML IJ SOLN
0.5000 mg | Freq: Once | INTRAMUSCULAR | Status: AC
  Administered 2020-10-21: 0.5 mg via INTRAVENOUS
  Filled 2020-10-21: qty 1
  Filled 2020-10-21: qty 0.25

## 2020-10-21 MED ORDER — ACETAMINOPHEN 10 MG/ML IV SOLN: 1000.0000 mg | Freq: Once | INTRAVENOUS | Status: DC | PRN

## 2020-10-21 MED ORDER — PROPOFOL 500 MG/50ML IV EMUL
INTRAVENOUS | Status: AC
  Filled 2020-10-21: qty 50

## 2020-10-21 MED ORDER — PHENYLEPHRINE HCL (PRESSORS) 10 MG/ML IV SOLN
INTRAVENOUS | Status: DC | PRN
  Administered 2020-10-21 (×3): 100 ug via INTRAVENOUS

## 2020-10-21 MED ORDER — MIDAZOLAM HCL 2 MG/2ML IJ SOLN
INTRAMUSCULAR | Status: DC | PRN
  Administered 2020-10-21: 1 mg via INTRAVENOUS

## 2020-10-21 MED ORDER — MIDAZOLAM HCL 2 MG/2ML IJ SOLN
INTRAMUSCULAR | Status: AC
  Filled 2020-10-21: qty 2

## 2020-10-21 MED ORDER — FENTANYL CITRATE (PF) 100 MCG/2ML IJ SOLN
INTRAMUSCULAR | Status: AC
  Filled 2020-10-21: qty 2

## 2020-10-21 MED ORDER — ALBUMIN HUMAN 25 % IV SOLN
INTRAVENOUS | Status: AC
  Filled 2020-10-21: qty 50

## 2020-10-21 MED ORDER — OXYCODONE HCL 5 MG PO TABS: 5.0000 mg | ORAL_TABLET | Freq: Once | ORAL | Status: DC | PRN

## 2020-10-21 MED ORDER — ONDANSETRON HCL 4 MG/2ML IJ SOLN: 4.0000 mg | Freq: Once | INTRAMUSCULAR | Status: DC | PRN

## 2020-10-21 MED ORDER — 0.9 % SODIUM CHLORIDE (POUR BTL) OPTIME
TOPICAL | Status: DC | PRN
  Administered 2020-10-21: 500 mL

## 2020-10-21 SURGICAL SUPPLY — 45 items
BLADE MED AGGRESSIVE (BLADE) ×2 IMPLANT
BLADE OSC/SAGITTAL MD 5.5X18 (BLADE) IMPLANT
BLADE SURG 15 STRL LF DISP TIS (BLADE) IMPLANT
BLADE SURG 15 STRL SS (BLADE)
BNDG CONFORM 2 STRL LF (GAUZE/BANDAGES/DRESSINGS) IMPLANT
BNDG ELASTIC 4X5.8 VLCR STR LF (GAUZE/BANDAGES/DRESSINGS) ×2 IMPLANT
BNDG ESMARK 4X12 TAN STRL LF (GAUZE/BANDAGES/DRESSINGS) ×2 IMPLANT
BNDG GAUZE ELAST 4 BULKY (GAUZE/BANDAGES/DRESSINGS) ×2 IMPLANT
CANISTER SUCT 1200ML W/VALVE (MISCELLANEOUS) ×2 IMPLANT
CNTNR SPEC 2.5X3XGRAD LEK (MISCELLANEOUS) ×1
CONT SPEC 4OZ STER OR WHT (MISCELLANEOUS) ×1
CONT SPEC 4OZ STRL OR WHT (MISCELLANEOUS) ×1
CONTAINER SPEC 2.5X3XGRAD LEK (MISCELLANEOUS) ×1 IMPLANT
CUFF TOURN SGL QUICK 12 (TOURNIQUET CUFF) IMPLANT
CUFF TOURN SGL QUICK 18X4 (TOURNIQUET CUFF) ×1 IMPLANT
DURAPREP 26ML APPLICATOR (WOUND CARE) ×2 IMPLANT
ELECT REM PT RETURN 9FT ADLT (ELECTROSURGICAL) ×2
ELECTRODE REM PT RTRN 9FT ADLT (ELECTROSURGICAL) ×1 IMPLANT
GAUZE 4X4 16PLY ~~LOC~~+RFID DBL (SPONGE) ×2 IMPLANT
GAUZE SPONGE 4X4 12PLY STRL (GAUZE/BANDAGES/DRESSINGS) ×4 IMPLANT
GAUZE XEROFORM 1X8 LF (GAUZE/BANDAGES/DRESSINGS) ×2 IMPLANT
GLOVE SURG ENC MOIS LTX SZ7 (GLOVE) ×2 IMPLANT
GLOVE SURG UNDER LTX SZ7 (GLOVE) ×2 IMPLANT
GOWN STRL REUS W/ TWL LRG LVL3 (GOWN DISPOSABLE) ×2 IMPLANT
GOWN STRL REUS W/TWL LRG LVL3 (GOWN DISPOSABLE) ×4
IV NS IRRIG 3000ML ARTHROMATIC (IV SOLUTION) IMPLANT
KIT TURNOVER KIT A (KITS) ×2 IMPLANT
LABEL OR SOLS (LABEL) ×2 IMPLANT
MANIFOLD NEPTUNE II (INSTRUMENTS) ×2 IMPLANT
NDL FILTER BLUNT 18X1 1/2 (NEEDLE) ×1 IMPLANT
NDL HYPO 25X1 1.5 SAFETY (NEEDLE) ×2 IMPLANT
NEEDLE FILTER BLUNT 18X 1/2SAF (NEEDLE) ×1
NEEDLE FILTER BLUNT 18X1 1/2 (NEEDLE) ×1 IMPLANT
NEEDLE HYPO 25X1 1.5 SAFETY (NEEDLE) ×4 IMPLANT
NS IRRIG 500ML POUR BTL (IV SOLUTION) ×2 IMPLANT
PACK EXTREMITY ARMC (MISCELLANEOUS) ×2 IMPLANT
SOL PREP PVP 2OZ (MISCELLANEOUS) ×2
SOLUTION PREP PVP 2OZ (MISCELLANEOUS) ×1 IMPLANT
STOCKINETTE STRL 6IN 960660 (GAUZE/BANDAGES/DRESSINGS) ×2 IMPLANT
SUT ETHILON 3-0 FS-10 30 BLK (SUTURE) ×4
SUT VIC AB 3-0 SH 27 (SUTURE) ×2
SUT VIC AB 3-0 SH 27X BRD (SUTURE) ×1 IMPLANT
SUTURE EHLN 3-0 FS-10 30 BLK (SUTURE) ×2 IMPLANT
SWAB CULTURE AMIES ANAERIB BLU (MISCELLANEOUS) ×1 IMPLANT
SYR 10ML LL (SYRINGE) ×2 IMPLANT

## 2020-10-21 NOTE — Addendum Note (Signed)
Addendum  created 10/21/20 2348 by Aline Brochure, CRNA   Intraprocedure Staff edited

## 2020-10-21 NOTE — Anesthesia Postprocedure Evaluation (Signed)
Anesthesia Post Note  Patient: [name]  Procedure(s) Performed: AMPUTATION TOE-5th Digit Amputation Right Foot (Right: Toe)  Patient location during evaluation: PACU Anesthesia Type: General Level of consciousness: awake and alert Pain management: pain level controlled Vital Signs Assessment: post-procedure vital signs reviewed and stable Respiratory status: spontaneous breathing, nonlabored ventilation, respiratory function stable and patient connected to nasal cannula oxygen Cardiovascular status: blood pressure returned to baseline and stable Postop Assessment: no apparent nausea or vomiting Anesthetic complications: no   No notable events documented.   Last Vitals:  Vitals:   10/21/20 1615 10/21/20 1645  BP: 112/66 118/79  Pulse:  (!) 102  Resp: 20 20  Temp: (!) 36.3 C 36.6 C  SpO2: 95% 98%    Last Pain:  Vitals:   10/21/20 1645  TempSrc:   PainSc: 8                  Arita Miss

## 2020-10-21 NOTE — Progress Notes (Signed)
[facility]  PROGRESS NOTE   Subjective:   Patient seen on dialysis.  Has been tolerating well without the left AV fistula. She is scheduled for a toe amputation today. Patient has been on Unasyn as per ID advice.  Objective:  Vital signs in last 24 hours:  Temp:  [98.3 F (36.8 C)-98.8 F (37.1 C)] 98.3 F (36.8 C) (08/27 1305) Pulse Rate:  [66-96] 93 (08/27 1305) Resp:  [13-19] 15 (08/27 1305) BP: (101-127)/(47-74) 105/52 (08/27 1305) SpO2:  [92 %-100 %] 99 % (08/27 1305)  Weight change:  Filed Weights   10/15/20 1905 10/18/20 0928  Weight: 126.1 kg 126.1 kg    Intake/Output: I/O last 3 completed shifts: In: 440 [P.O.:240; IV Piggyback:200] Out: 0    Intake/Output this shift:  Total I/O In: -  Out: 1500 [Other:1500]  Physical Exam: General:  No acute distress  Head:  Normocephalic, atraumatic. Moist oral mucosal membranes  Eyes:  Anicteric  Neck:  Supple  Lungs:   Clear to auscultation, normal effort  Heart:  S1S2 no rubs  Abdomen:   Soft, nontender, bowel sounds present  Extremities: Nonhealing ulcers of the toe.  Neurologic:  Awake, alert, following commands  Skin:  No lesions  Access:     Basic Metabolic Panel: Recent Labs  Lab 10/17/20 0352 10/18/20 0602 10/19/20 0526 10/20/20 0757 10/21/20 0104 10/21/20 0655  NA 128* 130* 126* 126*  --  126*  K 5.0 4.2 4.7 4.4  --  5.0  CL 91* 89* 85* 85*  --  87*  CO2 21* 25 24 25   --  23  GLUCOSE 194* 180* 325* 239*  --  126*  BUN 76* 48* 61* 45*  --  57*  CREATININE 10.68* 8.03* 8.86* 6.99*  --  8.04*  CALCIUM 9.4 8.7* 7.8* 7.8*  --  7.8*  MG 2.0 1.8 1.8 1.9 2.0 2.1  PHOS  --   --   --  4.5  --   --     CBC: Recent Labs  Lab 10/15/20 1958 10/16/20 1402 10/17/20 0352 10/18/20 0602 10/19/20 0526 10/20/20 0757 10/21/20 0655  WBC 22.1*   < > 20.8* 20.7* 22.9* 27.7* 31.1*  NEUTROABS 19.0*  --   --   --   --   --   --   HGB 9.8*   < > 9.3* 8.6* 7.9* 8.6* 8.3*  HCT 30.6*   < > 29.4*  26.6* 24.3* 26.6* 25.7*  MCV 96.5   < > 95.1 94.0 94.9 93.3 92.1  PLT 358   < > 314 328 351 396 399   < > = values in this interval not displayed.     Urinalysis: No results for input(s): COLORURINE, LABSPEC, PHURINE, GLUCOSEU, HGBUR, BILIRUBINUR, KETONESUR, PROTEINUR, UROBILINOGEN, NITRITE, LEUKOCYTESUR in the last 72 hours.  Invalid input(s): APPERANCEUR    Imaging: No results found.   Medications:    sodium chloride Stopped (10/16/20 1111)   albumin human     ampicillin-sulbactam (UNASYN) IV 3 g (10/20/20 2117)    aspirin EC  81 mg Oral Daily   atorvastatin  80 mg Oral q1800   Chlorhexidine Gluconate Cloth  6 each Topical Daily   cholecalciferol  4,000 Units Oral Weekly   cinacalcet  60 mg Oral Daily   docusate sodium  100 mg Oral Daily   feeding supplement (NEPRO CARB STEADY)  237 mL Oral BID BM   ferric citrate  210 mg Oral TID WC   heparin  5,000  Units Subcutaneous Q8H   insulin aspart  0-5 Units Subcutaneous QHS   insulin aspart  0-9 Units Subcutaneous TID WC   insulin aspart  2 Units Subcutaneous TID WC   insulin glargine-yfgn  36 Units Subcutaneous QHS   linaclotide  290 mcg Oral q morning   mometasone-formoterol  2 puff Inhalation BID   pantoprazole  40 mg Oral Daily   sodium chloride flush  3 mL Intravenous Q12H    Assessment/ Plan:     Principal Problem:   Diabetic foot ulcer ([place]) Active Problems:   Diabetes ([place])   Essential hypertension   DNR (do not resuscitate) discussion   End-stage renal disease on hemodialysis ([place])   Chronic respiratory failure with hypoxia and hypercapnia (HCC)   Decreased pedal pulses  50 year old obese female with history of hypertension, diabetes, coronary artery disease, congestive heart failure, peripheral vascular disease, end-stage renal disease on dialysis now admitted with history of nonhealing foot ulcers and gangrene with sepsis.  #1: ESRD: Patient is on Tuesday Thursday Saturday schedule.  Tolerating dialysis  well with fluid removal.  #2: Hypotension: Presently off of antihypertensive medications.  We will give albumin during dialysis for fluid removal.  #3: PVD/nonhealing foot ulcers.  Patient is scheduled for to amputation today.  #4: Sepsis: Patient is on IV Unasyn as per the advice of ID specialist.  #5: Anemia: We will continue to monitor hemoglobin levels closely.  We will follow anemia protocols.  #6: Secondary hyperparathyroidism: We will monitor PTH levels and also phosphorus levels.   LOS: [place], [facility] 8/27/20222:02 PM

## 2020-10-21 NOTE — Progress Notes (Signed)
PROGRESS NOTE    [name]  VEL:[id number] DOB: October 27, 1970 DOA: 10/15/2020 PCP: [name], NP   Assessment & Plan:   Principal Problem:   Diabetic foot ulcer ([place]) Active Problems:   Diabetes ([place])   Essential hypertension   DNR (do not resuscitate) discussion   End-stage renal disease on hemodialysis ([place])   Chronic respiratory failure with hypoxia and hypercapnia (HCC)   Decreased pedal pulses   Diabetic foot ulcer: w/ gangrene of 5th toe & osteomyelitis. Continue on IV unasyn as per ID. Wound cx growing enterococcus faecalis, klebsiella. Oxycodone prn for pain. Initially refused 5th toe amputation but now agreeable, will go for surgery today as per podiatry   PVD: has decreased pedal pulses. ABI shows moderate occlusive disease bilateral lower extremity. S/p angiogram 8/24 which showed normal common femoral artery, profunda femoris artery, superficial femoral artery & popliteal artery had mild lesion of 30% or < just below the knee.   Leukocytosis: continues to trend up secondary to above infection. Continue on IV abxs  DM2: poorly controlled w/ HbA1c 8.9. Continue on glargine, SSI w/ accuchecks   ESRD: on HD. Management as per nephro   ACD: likely secondary to ESRD. No need for a transfusion currently   Hyponatremia: managed w/ HD as per nephro   Chronic hypoxia: continue on supplemental oxygen, uses 3L [place]  Morbid obesity: BMI 46.9. Complicates overall care & prognosis       DVT prophylaxis: heparin  Code Status: full  Family Communication:  Disposition Plan: unclear, likely d/c back home   Level of care: Med-Surg  Status is: Inpatient  Remains inpatient appropriate because:Ongoing diagnostic testing needed not appropriate for outpatient work up, IV treatments appropriate due to intensity of illness or inability to take PO, and Inpatient level of care appropriate due to severity of illness  Dispo: The patient is from: Home              Anticipated  d/c is to: Home              Patient currently is not medically stable to d/c.   Difficult to place patient : unclear     Consultants:  Vascular surg ID  Procedures:   Antimicrobials: unasyn   Subjective: Pt c/o anxiety   Objective: Vitals:   10/20/20 2236 10/21/20 0035 10/21/20 0444 10/21/20 0754  BP:  102/63 103/64 106/62  Pulse:  96 90 89  Resp:  16 16 18   Temp:  98.3 F (36.8 C) 98.4 F (36.9 C) 98.6 F (37 C)  TempSrc:      SpO2: 92% 98% 100%   Weight:      Height:        Intake/Output Summary (Last 24 hours) at 10/21/2020 0802 Last data filed at 10/21/2020 0600 Gross per 24 hour  Intake 200 ml  Output 0 ml  Net 200 ml    Filed Weights   10/15/20 1905 10/18/20 0928  Weight: 126.1 kg 126.1 kg    Examination:  General exam: Appears anxious  Respiratory system: decreased breath sounds b/l Cardiovascular system: S1/S2+. No rubs or clicks  Gastrointestinal system: Abd is soft, NT,obese & hypoactive bowel sounds Central nervous system: Awake and oriented. Moves all extremities Psychiatry: Judgement and insight appear normal. Anxious mood and affect    Data Reviewed: I have personally reviewed following labs and imaging studies  CBC: Recent Labs  Lab 10/15/20 1958 10/16/20 1402 10/17/20 0352 10/18/20 0602 10/19/20 2585 10/20/20 0757 10/21/20 2778  WBC 22.1*   < > 20.8* 20.7* 22.9* 27.7* PENDING  NEUTROABS 19.0*  --   --   --   --   --   --   HGB 9.8*   < > 9.3* 8.6* 7.9* 8.6* 8.3*  HCT 30.6*   < > 29.4* 26.6* 24.3* 26.6* 25.7*  MCV 96.5   < > 95.1 94.0 94.9 93.3 92.1  PLT 358   < > 314 328 351 396 399   < > = values in this interval not displayed.   Basic Metabolic Panel: Recent Labs  Lab 10/17/20 0352 10/18/20 0602 10/19/20 0526 10/20/20 0757 10/21/20 0104 10/21/20 0655  NA 128* 130* 126* 126*  --  126*  K 5.0 4.2 4.7 4.4  --  5.0  CL 91* 89* 85* 85*  --  87*  CO2 21* 25 24 25   --  23  GLUCOSE 194* 180* 325* 239*  --  126*  BUN  76* 48* 61* 45*  --  57*  CREATININE 10.68* 8.03* 8.86* 6.99*  --  8.04*  CALCIUM 9.4 8.7* 7.8* 7.8*  --  7.8*  MG 2.0 1.8 1.8 1.9 2.0 2.1  PHOS  --   --   --  4.5  --   --    GFR: Estimated Creatinine Clearance: 11.2 mL/min (A) (by C-G formula based on SCr of 8.04 mg/dL (H)). Liver Function Tests: Recent Labs  Lab 10/15/20 1958 10/16/20 0347  AST 64* 45*  ALT 59* 47*  ALKPHOS 559* 474*  BILITOT 0.7 0.9  PROT 8.3* 7.4  ALBUMIN 3.3* 2.7*   No results for input(s): LIPASE, AMYLASE in the last 168 hours. No results for input(s): AMMONIA in the last 168 hours. Coagulation Profile: Recent Labs  Lab 10/15/20 1958  INR 1.2   Cardiac Enzymes: No results for input(s): CKTOTAL, CKMB, CKMBINDEX, TROPONINI in the last 168 hours. BNP (last 3 results) No results for input(s): PROBNP in the last 8760 hours. HbA1C: No results for input(s): HGBA1C in the last 72 hours. CBG: Recent Labs  Lab 10/20/20 0809 10/20/20 1222 10/20/20 1626 10/20/20 2045 10/21/20 0800  GLUCAP 246* 300* 290* 217* 127*   Lipid Profile: No results for input(s): CHOL, HDL, LDLCALC, TRIG, CHOLHDL, LDLDIRECT in the last 72 hours. Thyroid Function Tests: No results for input(s): TSH, T4TOTAL, FREET4, T3FREE, THYROIDAB in the last 72 hours. Anemia Panel: No results for input(s): VITAMINB12, FOLATE, FERRITIN, TIBC, IRON, RETICCTPCT in the last 72 hours. Sepsis Labs: Recent Labs  Lab 10/15/20 1958 10/16/20 0500 10/17/20 0352  PROCALCITON  --  3.26 4.86  LATICACIDVEN 1.8  --   --     Recent Results (from the past 240 hour(s))  Blood Culture (routine x 2)     Status: None   Collection Time: 10/15/20  8:03 PM   Specimen: BLOOD  Result Value Ref Range Status   Specimen Description BLOOD BLOOD RIGHT FOREARM  Final   Special Requests   Final    BOTTLES DRAWN AEROBIC AND ANAEROBIC Blood Culture adequate volume   Culture   Final    NO GROWTH 5 DAYS Performed at [facility], [address]  [place], [place] [address]    Report Status 10/20/2020 FINAL  Final  Resp Panel by RT-PCR (Flu A&B, Covid) Nasopharyngeal Swab     Status: None   Collection Time: 10/15/20  8:26 PM   Specimen: Nasopharyngeal Swab; Nasopharyngeal(NP) swabs in vial transport medium  Result Value Ref Range Status   SARS  Coronavirus 2 by RT PCR NEGATIVE NEGATIVE Final    Comment: (NOTE) SARS-CoV-2 target nucleic acids are NOT DETECTED.  The SARS-CoV-2 RNA is generally detectable in upper respiratory specimens during the acute phase of infection. The lowest concentration of SARS-CoV-2 viral copies this assay can detect is 138 copies/mL. A negative result does not preclude SARS-Cov-2 infection and should not be used as the sole basis for treatment or other patient management decisions. A negative result may occur with  improper specimen collection/handling, submission of specimen other than nasopharyngeal swab, presence of viral mutation(s) within the areas targeted by this assay, and inadequate number of viral copies(<138 copies/mL). A negative result must be combined with clinical observations, patient history, and epidemiological information. The expected result is Negative.  Fact Sheet for Patients:  EntrepreneurPulse.com.au  Fact Sheet for Healthcare Providers:  IncredibleEmployment.be  This test is no t yet approved or cleared by the [place] FDA and  has been authorized for detection and/or diagnosis of SARS-CoV-2 by FDA under an Emergency Use Authorization (EUA). This EUA will remain  in effect (meaning this test can be used) for the duration of the COVID-19 declaration under Section 564(b)(1) of the Act, 21 U.S.C.section 360bbb-3(b)(1), unless the authorization is terminated  or revoked sooner.       Influenza A by PCR NEGATIVE NEGATIVE Final   Influenza B by PCR NEGATIVE NEGATIVE Final    Comment: (NOTE) The Xpert Xpress SARS-CoV-2/FLU/RSV plus assay is  intended as an aid in the diagnosis of influenza from Nasopharyngeal swab specimens and should not be used as a sole basis for treatment. Nasal washings and aspirates are unacceptable for Xpert Xpress SARS-CoV-2/FLU/RSV testing.  Fact Sheet for Patients: EntrepreneurPulse.com.au  Fact Sheet for Healthcare Providers: IncredibleEmployment.be  This test is not yet approved or cleared by the [place] FDA and has been authorized for detection and/or diagnosis of SARS-CoV-2 by FDA under an Emergency Use Authorization (EUA). This EUA will remain in effect (meaning this test can be used) for the duration of the COVID-19 declaration under Section 564(b)(1) of the Act, 21 U.S.C. section 360bbb-3(b)(1), unless the authorization is terminated or revoked.  Performed at [facility], [address] [place], [place] [address]   Blood Culture (routine x 2)     Status: None   Collection Time: 10/15/20  8:26 PM   Specimen: BLOOD  Result Value Ref Range Status   Specimen Description BLOOD RIGHT ANTECUBITAL  Final   Special Requests   Final    BOTTLES DRAWN AEROBIC AND ANAEROBIC Blood Culture adequate volume   Culture   Final    NO GROWTH 5 DAYS Performed at [facility], [address] [place], [place] [address]    Report Status 10/20/2020 FINAL  Final  Aerobic Culture w Gram Stain (superficial specimen)     Status: None   Collection Time: 10/17/20  8:52 PM   Specimen: Wound  Result Value Ref Range Status   Specimen Description WOUND  Final   Special Requests NONE  Final   Gram Stain   Final    RARE SQUAMOUS EPITHELIAL CELLS PRESENT FEW WBC PRESENT, PREDOMINANTLY MONONUCLEAR ABUNDANT GRAM VARIABLE ROD FEW GRAM POSITIVE COCCI    Culture   Final    MODERATE ENTEROCOCCUS FAECALIS MODERATE KLEBSIELLA OXYTOCA ABUNDANT DIPHTHEROIDS(CORYNEBACTERIUM SPECIES) Standardized susceptibility testing for this organism is not available.     Report Status 10/20/2020 FINAL  Final   Organism ID, Bacteria ENTEROCOCCUS FAECALIS  Final   Organism ID, Bacteria KLEBSIELLA  OXYTOCA  Final      Susceptibility   Enterococcus faecalis - MIC*    AMPICILLIN <=2 SENSITIVE Sensitive     VANCOMYCIN 1 SENSITIVE Sensitive     GENTAMICIN SYNERGY RESISTANT Resistant     * MODERATE ENTEROCOCCUS FAECALIS   Klebsiella oxytoca - MIC*    AMPICILLIN >=32 RESISTANT Resistant     CEFAZOLIN <=4 SENSITIVE Sensitive     CEFEPIME <=0.12 SENSITIVE Sensitive     CEFTAZIDIME <=1 SENSITIVE Sensitive     CEFTRIAXONE <=0.25 SENSITIVE Sensitive     CIPROFLOXACIN <=0.25 SENSITIVE Sensitive     GENTAMICIN <=1 SENSITIVE Sensitive     IMIPENEM <=0.25 SENSITIVE Sensitive     TRIMETH/SULFA <=20 SENSITIVE Sensitive     AMPICILLIN/SULBACTAM 8 SENSITIVE Sensitive     PIP/TAZO <=4 SENSITIVE Sensitive     * MODERATE KLEBSIELLA OXYTOCA         Radiology Studies: No results found.      Scheduled Meds:  aspirin EC  81 mg Oral Daily   atorvastatin  80 mg Oral q1800   Chlorhexidine Gluconate Cloth  6 each Topical Daily   cholecalciferol  4,000 Units Oral Weekly   cinacalcet  60 mg Oral Daily   docusate sodium  100 mg Oral Daily   feeding supplement (NEPRO CARB STEADY)  237 mL Oral BID BM   ferric citrate  210 mg Oral TID WC   heparin  5,000 Units Subcutaneous Q8H   insulin aspart  0-5 Units Subcutaneous QHS   insulin aspart  0-9 Units Subcutaneous TID WC   insulin aspart  2 Units Subcutaneous TID WC   insulin glargine-yfgn  36 Units Subcutaneous QHS   linaclotide  290 mcg Oral q morning   mometasone-formoterol  2 puff Inhalation BID   pantoprazole  40 mg Oral Daily   sodium chloride flush  3 mL Intravenous Q12H   Continuous Infusions:  sodium chloride Stopped (10/16/20 1111)   ampicillin-sulbactam (UNASYN) IV 3 g (10/20/20 2117)     LOS: 6 days    Time spent: 33 mins     Wyvonnia Dusky, MD [facility] Pager [phone]  If  7PM-7AM, please contact night-coverage 10/21/2020, 8:02 AM

## 2020-10-21 NOTE — Brief Op Note (Signed)
10/15/2020 - 10/21/2020  3:23 PM   PATIENT:  [name]  50 y.o. female  PRE-OPERATIVE DIAGNOSIS:  osteomyelitis right fifth toe.   POST-OPERATIVE DIAGNOSIS:  osteomyeilitis right fifth toe. Abscess right foot.   PROCEDURE:  Procedure(s): AMPUTATION TOE-5th Digit Amputation Right Foot (Right) Incision and drainage right foot  SURGEON:  Surgeon(s) and Role:    Edrick Kins, DPM - Primary  PHYSICIAN ASSISTANT: None  ASSISTANTS: none   ANESTHESIA:   MAC / Local  EBL:  24m   BLOOD ADMINISTERED:none  DRAINS: none   LOCAL MEDICATIONS USED:  MARCAINE    and LIDOCAINE   SPECIMEN:  Source of Specimen:  culture RT foot. Bone biospy RT 5th metatarsal  DISPOSITION OF SPECIMEN:  PATHOLOGY  COUNTS:  YES  TOURNIQUET:  * Missing tourniquet times found for documented tourniquets in log: [id number]*  DICTATION: .DViviann SpareDictation  PLAN OF CARE: Admit to inpatient   PATIENT DISPOSITION:  PACU - hemodynamically stable.   Delay start of Pharmacological VTE agent (>24hrs) due to surgical blood loss or risk of bleeding: yes

## 2020-10-21 NOTE — Progress Notes (Signed)
[facility] called. Pt had a run of Vtach unsustained. Dr. Sidney Ace made aware. New order received.

## 2020-10-21 NOTE — Progress Notes (Signed)
[facility]  Pt completed full tx. 1.5 L removed with albumin X 1. Ativan given by floor due to anxiety.  SK

## 2020-10-21 NOTE — Transfer of Care (Signed)
Immediate Anesthesia Transfer of Care Note  Patient: [name]  Procedure(s) Performed: AMPUTATION TOE-5th Digit Amputation Right Foot (Right: Toe)  Patient Location: PACU  Anesthesia Type:General  Level of Consciousness: sedated  Airway & Oxygen Therapy: Patient Spontanous Breathing and Patient connected to nasal cannula oxygen  Post-op Assessment: Report given to RN and Post -op Vital signs reviewed and stable  Post vital signs: Reviewed and stable  Last Vitals:  Vitals Value Taken Time  BP    Temp    Pulse    Resp 26 10/21/20 1523  SpO2    Vitals shown include unvalidated device data.  Last Pain:  Vitals:   10/21/20 1305  TempSrc: Oral  PainSc: 0-No pain      Patients Stated Pain Goal: 4 (90/38/33 3832)  Complications: No notable events documented.

## 2020-10-21 NOTE — Anesthesia Preprocedure Evaluation (Addendum)
Anesthesia Evaluation  Patient identified by MRN, date of birth, ID band Patient awake  General Assessment Comment:Patient appears drowsy, says she took xanax recently. Appears anxious about procedure and anesthetic.  Reviewed: Allergy & Precautions, NPO status , [name] Chart, lab work & pertinent test results  History of Anesthesia Complications Negative for: history of anesthetic complications  Airway Mallampati: II  TM Distance: >3 FB Neck ROM: Full    Dental no notable dental hx. (+) Teeth Intact   Pulmonary asthma , neg sleep apnea, COPD,  oxygen dependent, Patient abstained from smoking.Not current smoker,  On 3-4L/min O2 Denies OSA    + decreased breath sounds      Cardiovascular Exercise Tolerance: Poor METShypertension, Pt. on medications +CHF and + Orthopnea  (-) CAD and (-) Past MI (-) dysrhythmias  Rhythm:Regular Rate:Normal - Systolic murmurs TTE 7412: Study Conclusions   - Left ventricle: The cavity size was mildly dilated. Systolic  function was normal. The estimated ejection fraction was 55%.  Wall motion was normal; there were no regional wall motion  abnormalities. Doppler parameters are consistent with abnormal  left ventricular relaxation (grade 1 diastolic dysfunction).  - Mitral valve: There was mild regurgitation.  - Atrial septum: No defect or patent foramen ovale was identified.    Neuro/Psych PSYCHIATRIC DISORDERS Anxiety Depression negative neurological ROS     GI/Hepatic neg GERD  ,(+)     (-) substance abuse  ,   Endo/Other  diabetes, Poorly Controlled, Insulin DependentMorbid obesity  Renal/GU ESRF and DialysisRenal diseaseLast dialyzed this morning     Musculoskeletal   Abdominal (+) + obese,   Peds  Hematology   Anesthesia Other Findings Past Medical History: No date: (HFpEF) heart failure with preserved ejection fraction ([place])     Comment:  a. 11/2014 Echo: EF  50-55%, Gr1 DD; b. 06/2016 Echo: EF               60-65%, no rwma, mod dil LA, nl RV, triv eff. No date: Anxiety No date: Asthma No date: Bronchitis No date: CHF (congestive heart failure) (HCC) No date: Chronic cough No date: Chronic respiratory failure with hypoxia and hypercapnia  (HCC)     Comment:  a. Home O2.  (2-4L) No date: CKD (chronic kidney disease), stage III (HCC) No date: Depression No date: Diabetes mellitus without complication (HCC) No date: Diverticulosis No date: Dyspnea No date: Dysrhythmia     Comment:  per pt, no arrythmias No date: Environmental and seasonal allergies No date: Extreme obesity with alveolar hypoventilation (HCC) No date: Hypertension No date: Iron deficiency anemia     Comment:  a. chronic blood loss->heavy menses. No date: Kidney failure     Comment:  dialysis tues/thur/sat   ... left arm shunt No date: Orthopnea No date: Oxygen dependent     Comment:  3l/min No date: Pericardial effusion     Comment:  a. 11/2014 CT Chest: Mod effusion; b. 11/2014 Echo:               small to mod circumferential effusion. No hemodynamic               compromise. No date: Pneumonia     Comment:  history No date: Poorly controlled diabetes mellitus ([place])     Comment:  a. 11/2014 A1c 13.2. No date: Swelling of both lower extremities  Reproductive/Obstetrics  Anesthesia Physical Anesthesia Plan  ASA: 4  Anesthesia Plan: General   Post-op Pain Management:    Induction: Intravenous  PONV Risk Score and Plan: 3 and Ondansetron, Propofol infusion, TIVA, Midazolam and Dexamethasone  Airway Management Planned: Natural Airway and Simple Face Mask  Additional Equipment: None  Intra-op Plan:   Post-operative Plan:   Informed Consent: I have reviewed the patients History and Physical, chart, labs and discussed the procedure including the risks, benefits and alternatives for the proposed anesthesia with  the patient or authorized representative who has indicated his/her understanding and acceptance.     Dental advisory given  Plan Discussed with: CRNA and Surgeon  Anesthesia Plan Comments: (Discussed risks of anesthesia with patient, including possibility of difficulty with spontaneous ventilation under anesthesia necessitating airway intervention, PONV, and rare risks such as cardiac or respiratory or neurological events, and allergic reactions. Patient understands. Patient counseled on being higher risk for anesthesia due to comorbidities: ESRD, morbid obesity. Patient was told about increased risk of cardiac and respiratory events, including death. Patient understands. )       Anesthesia Quick Evaluation

## 2020-10-21 NOTE — Op Note (Addendum)
OPERATIVE REPORT Patient name: [name] MRN: [id number] DOB: [birth year]  DOS: 10/21/2020  Preop Dx: Osteomyelitis right fifth Postop Dx: Osteomyelitis right fifth toe.  Abscess right foot  Procedure:  1.  Partial fifth ray amputation right foot 2.  Incision and drainage right foot  Surgeon: Edrick Kins DPM  Anesthesia: 50-50 mixture of 2% lidocaine plain with 0.5% Marcaine plain totaling 20 mL infiltrated in the patient's right lower extremity in an ankle block fashion  Hemostasis: No tourniquet utilized  EBL: 20 mL Materials: None Injectables: None Pathology: Culture right foot sent for aerobic anaerobic and gram stain.  Bone culture right fifth metatarsal  Condition: The patient tolerated the procedure and anesthesia well. No complications noted or reported   Justification for procedure: The patient is a 50 y.o. female who presents today for surgical correction of osteomyelitis to the right fifth toe complicated by PVD. All conservative modalities of been unsuccessful in providing any sort of satisfactory alleviation of symptoms with the patient. The patient was told benefits as well as possible side effects of the surgery. The patient consented for surgical correction. The patient consent form was reviewed. All patient questions were answered. No guarantees were expressed or implied. The patient and the surgeon both signed the patient consent form with the witness present and placed in the patient's chart.   Procedure in Detail: The patient was brought to the operating room, placed in the operating table in the supine position at which time an aseptic scrub and drape were performed about the patient's respective lower extremity after anesthesia was induced as described above. Attention was then directed to the surgical area where procedure number one commenced.  Procedure #1: Partial fifth ray amputation right foot A racquet type incision was planned and made about the  fifth metatarsal phalangeal joint of the right foot.  Incision was carried down to the level of joint capsule using a surgical #15 scalpel.  Minimal bleeding was noted during incision.  Concern for peripheral limb ischemia.  The right fifth toe was disarticulated at the fifth MTPJ.  The head of the fifth metatarsal was then identified, which was necrotic, and an osteotomy was performed more proximal to healthier bone margin.  The distal portion of the fifth metatarsal was sharply resected away and placed in a sterile specimen container and sent for bone culture.  Attention was then directed to the soft tissue structures around the amputation site where procedure #2 commenced  Procedure #2: Incision and drainage right foot Blunt soft tissue dissection of the fifth ray amputation site indicated at heavy amount of purulence along the plantar compartments of the foot.  Blunt dissection was utilized using a curved mosquito.  All compartments of the foot were explored and heavy purulence was drained from these areas.  Approximately 30 mL total purulence was expressed from the foot.  The abscess along the plantar compartment of the foot extended to the medial longitudinal arch of the foot  Decision was made to create a small incision along the plantar medial arch of the foot.  As soon as the skin tissue was perforated, heavy purulence was drained from this area.  Additional blunt soft tissue dissection was utilized in this area and additional purulence was drained from this area.  The superficial epidermis along the plantar foot also was detached with underlying abscess and purulence.  The superficial skin was excisionally removed and deroofed.  The small incision made along the medial plantar arch of the foot connected  along the plantar compartment of the foot to the fifth toe amputation site.  Unfortunately again there was minimal bleeding noted.  Heavy purulence along all compartments of the plantar  foot.  Copious irrigation was then utilized using normal saline in combination with gentamicin powder.  After copious irrigation, both the incision site along the medial longitudinal arch which was approximately 1 cm in length as well as the fifth toe amputation site were packed with half-inch iodoform packing.  Amputation site was left open to allow for drainage.  No suture or closure was performed.  Dry sterile compressive dressings were then applied to all previously mentioned incision sites about the patient's lower extremity. The tourniquet which was used for hemostasis was deflated. All normal neurovascular responses including pink color and warmth returned all the digits of patient's lower extremity.  Impression: Unfortunately minimal bleeding was noted during surgery indicating severe peripheral vascular disease.  Heavy purulence along all compartments of the foot in combination with the established osteomyelitis makes the patient at high risk for more proximal amputation or limb loss.  Chances of healing appears to be very small  The patient was then transferred from the operating room to the recovery room having tolerated the procedure and anesthesia well. All vital signs are stable. After a brief stay in the recovery room the patient was readmitted to inpatient room with adequate prescriptions for analgesia. Verbal as well as written instructions were provided for the patient regarding wound care.  Keep dressings clean dry and intact.  Will follow and see patient tomorrow a.m.    Edrick Kins, DPM [facility]  Dr. Edrick Kins, DPM    [address],  [address]                Office [phone]  Fax [phone]

## 2020-10-21 NOTE — Plan of Care (Signed)

## 2020-10-21 NOTE — Progress Notes (Signed)
PT Cancellation Note  Patient Details Name: [name] MRN: [id number] DOB: [birth year]   Cancelled Treatment:    Reason Eval/Treat Not Completed: Patient at procedure or test/unavailable.  In HD, will retry as time and pt allow.  WBAT on affected foot per podiatrist.   Ramond Dial 10/21/2020, 11:23 AM  Mee Hives, PT MS Acute Rehab Dept. Number: [place] [facility] [place]

## 2020-10-22 ENCOUNTER — Encounter: Payer: Self-pay | Admitting: Podiatry

## 2020-10-22 ENCOUNTER — Inpatient Hospital Stay: Payer: Medicare HMO

## 2020-10-22 DIAGNOSIS — J690 Pneumonitis due to inhalation of food and vomit: Secondary | ICD-10-CM

## 2020-10-22 DIAGNOSIS — L97519 Non-pressure chronic ulcer of other part of right foot with unspecified severity: Secondary | ICD-10-CM | POA: Diagnosis not present

## 2020-10-22 DIAGNOSIS — E11621 Type 2 diabetes mellitus with foot ulcer: Secondary | ICD-10-CM | POA: Diagnosis not present

## 2020-10-22 LAB — COMPREHENSIVE METABOLIC PANEL
ALT: 22 U/L (ref 0–44)
AST: 60 U/L — ABNORMAL HIGH (ref 15–41)
Albumin: 2.4 g/dL — ABNORMAL LOW (ref 3.5–5.0)
Alkaline Phosphatase: 477 U/L — ABNORMAL HIGH (ref 38–126)
Anion gap: 15 (ref 5–15)
BUN: 41 mg/dL — ABNORMAL HIGH (ref 6–20)
CO2: 26 mmol/L (ref 22–32)
Calcium: 7.7 mg/dL — ABNORMAL LOW (ref 8.9–10.3)
Chloride: 87 mmol/L — ABNORMAL LOW (ref 98–111)
Creatinine, Ser: 6.01 mg/dL — ABNORMAL HIGH (ref 0.44–1.00)
GFR, Estimated: 8 mL/min — ABNORMAL LOW (ref >60)
Glucose, Bld: 215 mg/dL — ABNORMAL HIGH (ref 70–99)
Potassium: 4.8 mmol/L (ref 3.5–5.1)
Sodium: 128 mmol/L — ABNORMAL LOW (ref 135–145)
Total Bilirubin: 1 mg/dL (ref 0.3–1.2)
Total Protein: 6.7 g/dL (ref 6.5–8.1)

## 2020-10-22 LAB — PREPARE RBC (CROSSMATCH)

## 2020-10-22 LAB — CBC
HCT: 21 % — ABNORMAL LOW (ref 36.0–46.0)
Hemoglobin: 6.8 g/dL — ABNORMAL LOW (ref 12.0–15.0)
MCH: 30.9 pg (ref 26.0–34.0)
MCHC: 32.4 g/dL (ref 30.0–36.0)
MCV: 95.5 fL (ref 80.0–100.0)
Platelets: 376 10*3/uL (ref 150–400)
RBC: 2.2 MIL/uL — ABNORMAL LOW (ref 3.87–5.11)
RDW: 14.2 % (ref 11.5–15.5)
WBC: 31.3 10*3/uL — ABNORMAL HIGH (ref 4.0–10.5)
nRBC: 0.1 % (ref 0.0–0.2)

## 2020-10-22 LAB — HEMOGLOBIN AND HEMATOCRIT, BLOOD
HCT: 24.7 % — ABNORMAL LOW (ref 36.0–46.0)
Hemoglobin: 8.3 g/dL — ABNORMAL LOW (ref 12.0–15.0)

## 2020-10-22 LAB — GLUCOSE, CAPILLARY
Glucose-Capillary: 229 mg/dL — ABNORMAL HIGH (ref 70–99)
Glucose-Capillary: 240 mg/dL — ABNORMAL HIGH (ref 70–99)
Glucose-Capillary: 159 mg/dL — ABNORMAL HIGH (ref 70–99)

## 2020-10-22 IMAGING — DX DG CHEST 1V PORT
1 series · 1 of 1 positions shown · non-contrast
Comparison: [DATE].

CLINICAL DATA: Dyspnea, cough.

EXAM:
PORTABLE CHEST 1 VIEW

[chest ap]
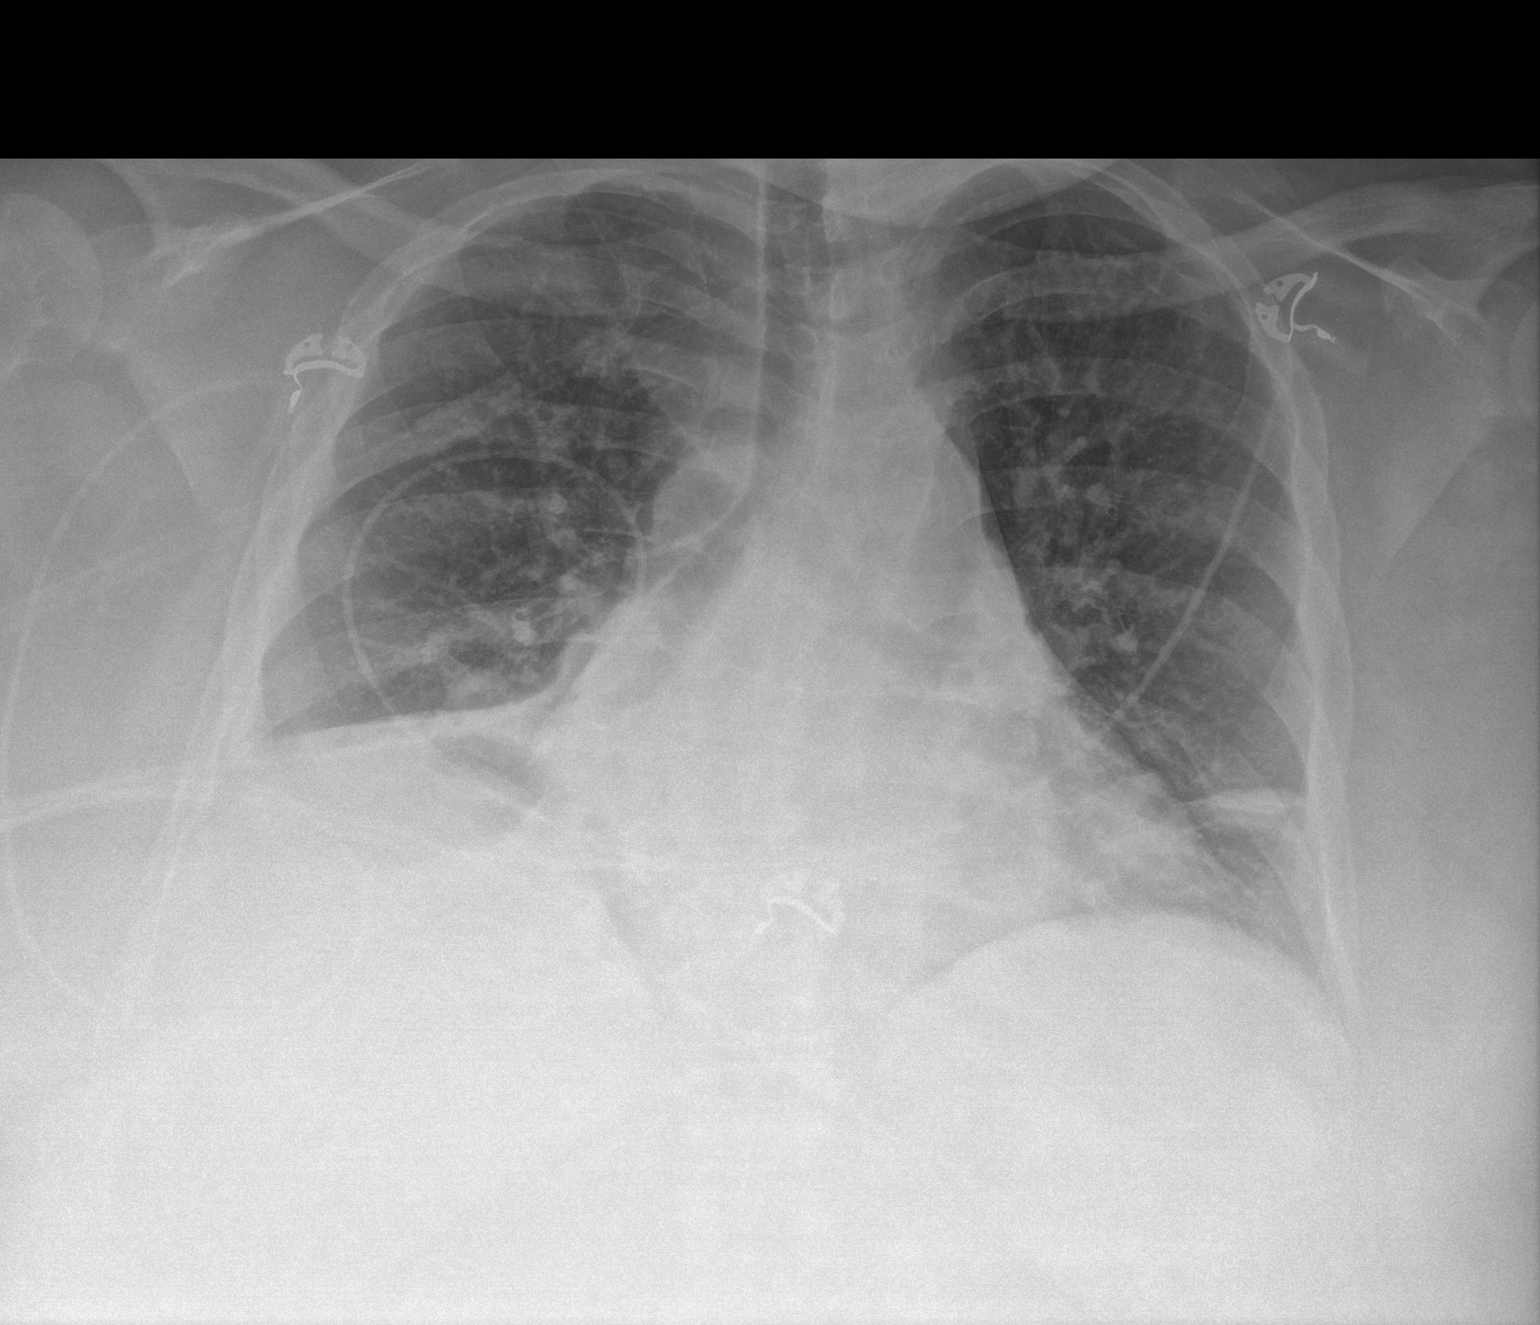

[1 of 1 positions shown; findings below may reference images not displayed]

FINDINGS: The heart size and mediastinal contours are within normal limits. No
pneumothorax is noted. Mild left lingular subsegmental atelectasis
is noted. Right middle lobe opacity is noted concerning for
atelectasis or pneumonia and possible right pleural effusion. The
visualized skeletal structures are unremarkable.
IMPRESSION: Right middle lobe opacity is noted concerning for atelectasis or
pneumonia and possible right pleural effusion.

## 2020-10-22 MED ORDER — HYDROCORTISONE-ACETIC ACID 1-2 % OT SOLN
3.0000 [drp] | Freq: Four times a day (QID) | OTIC | Status: DC
  Administered 2020-10-22 – 2020-10-25 (×11): 3 [drp] via OTIC
  Filled 2020-10-22: qty 10

## 2020-10-22 MED ORDER — SODIUM CHLORIDE 0.9% IV SOLUTION: Freq: Once | INTRAVENOUS | Status: AC

## 2020-10-22 MED ORDER — MORPHINE SULFATE (PF) 2 MG/ML IV SOLN
1.0000 mg | INTRAVENOUS | Status: DC | PRN
  Administered 2020-10-22 – 2020-10-23 (×3): 1 mg via INTRAVENOUS
  Filled 2020-10-22 (×3): qty 1

## 2020-10-22 MED ORDER — SODIUM CHLORIDE 0.9 % IV SOLN
12.5000 mg | Freq: Four times a day (QID) | INTRAVENOUS | Status: DC | PRN
  Filled 2020-10-22: qty 0.25

## 2020-10-22 MED ORDER — LORAZEPAM 2 MG/ML IJ SOLN
1.0000 mg | Freq: Three times a day (TID) | INTRAMUSCULAR | Status: DC | PRN
  Administered 2020-10-22 – 2020-10-23 (×3): 1 mg via INTRAVENOUS
  Filled 2020-10-22 (×3): qty 1

## 2020-10-22 MED ORDER — PHENOL 1.4 % MT LIQD
1.0000 | OROMUCOSAL | Status: DC | PRN
  Filled 2020-10-22: qty 177

## 2020-10-22 MED ORDER — DIPHENHYDRAMINE HCL 25 MG PO CAPS
25.0000 mg | ORAL_CAPSULE | Freq: Four times a day (QID) | ORAL | Status: DC | PRN
  Administered 2020-10-22 – 2020-11-05 (×16): 25 mg via ORAL
  Filled 2020-10-22 (×15): qty 1

## 2020-10-22 NOTE — Progress Notes (Signed)
PODIATRY PROGRESS NOTE  NAME [name] MRN [id number] DOB 05/06/1970 DOA 10/15/2020   Subjetive: Pt s/p RT partial 5th ray amputation with I&D. DOS: 10/21/2020. Resting comfortably in bed with husband present.  Patient states that she does not have any pain associated to the foot.  Denies N/V/SOB/CP/chills  Past Medical History:  Diagnosis Date   (HFpEF) heart failure with preserved ejection fraction ([place])    a. 11/2014 Echo: EF 50-55%, Gr1 DD; b. 06/2016 Echo: EF 60-65%, no rwma, mod dil LA, nl RV, triv eff.   Anxiety    Asthma    Bronchitis    CHF (congestive heart failure) (HCC)    Chronic cough    Chronic respiratory failure with hypoxia and hypercapnia ([place])    a. Home O2.  (2-4L)   CKD (chronic kidney disease), stage III (HCC)    Depression    Diabetes mellitus without complication (HCC)    Diverticulosis    Dyspnea    Dysrhythmia    per pt, no arrythmias   Environmental and seasonal allergies    Extreme obesity with alveolar hypoventilation (HCC)    Hypertension    Iron deficiency anemia    a. chronic blood loss->heavy menses.   Kidney failure    dialysis tues/thur/sat   ... left arm shunt   Orthopnea    Oxygen dependent    3l/min   Pericardial effusion    a. 11/2014 CT Chest: Mod effusion; b. 11/2014 Echo: small to mod circumferential effusion. No hemodynamic compromise.   Pneumonia    history   Poorly controlled diabetes mellitus ([place])    a. 11/2014 A1c 13.2.   Swelling of both lower extremities     CBC Latest Ref Rng & Units 10/22/2020 10/21/2020 10/20/2020  WBC 4.0 - 10.5 K/uL 31.3(H) 31.1(H) 27.7(H)  Hemoglobin 12.0 - 15.0 g/dL 6.8(L) 8.3(L) 8.6(L)  Hematocrit 36.0 - 46.0 % 21.0(L) 25.7(L) 26.6(L)  Platelets 150 - 400 K/uL 376 399 396    BMP Latest Ref Rng & Units 10/22/2020 10/21/2020 10/20/2020  Glucose 70 - 99 mg/dL 215(H) 126(H) 239(H)  BUN 6 - 20 mg/dL 41(H) 57(H) 45(H)  Creatinine 0.44 - 1.00 mg/dL 6.01(H) 8.04(H) 6.99(H)  Sodium 135 - 145  mmol/L 128(L) 126(L) 126(L)  Potassium 3.5 - 5.1 mmol/L 4.8 5.0 4.4  Chloride 98 - 111 mmol/L 87(L) 87(L) 85(L)  CO2 22 - 32 mmol/L 26 23 25   Calcium 8.9 - 10.3 mg/dL 7.7(L) 7.8(L) 7.8(L)        ASSESSMENT/PLAN OF CARE S/P RT fifth toe amputation with I&D.  DOS: 10/21/2020 Critical limb ischemia RT -Foot remains very cold to touch.  Minimal bleeding noted upon removal of the packing from the I&D sites.  High concern for ischemia which may lead to more proximal amputation -Dressings changed.  Packing reapplied -Continue IV antibiotics as per ID 3 g Unasyn Q12H -Had a conversation with both the patient and the husband about the severity of her foot condition with the limited perfusion and infection which resulted in high risk of limb loss.  Patient and husband justifiably saddened but understanding.  We will continue daily packing with the possibility of returning to the OR for repeat irrigation and debridement     Please contact me directly with any questions or concerns.  Cell [phone]   Edrick Kins, DPM [facility]  Dr. Edrick Kins, DPM    [address]  [place], [place] [address]                Office [phone]  Fax [phone]

## 2020-10-22 NOTE — Progress Notes (Signed)
PROGRESS NOTE    [name]  FMB:[id number] DOB: 1970/11/22 DOA: 10/15/2020 PCP: [name], NP   Assessment & Plan:   Principal Problem:   Diabetic foot ulcer ([place]) Active Problems:   Diabetes ([place])   Essential hypertension   DNR (do not resuscitate) discussion   End-stage renal disease on hemodialysis ([place])   Chronic respiratory failure with hypoxia and hypercapnia (HCC)   Decreased pedal pulses   Diabetic foot ulcer: w/ gangrene of 5th toe & osteomyelitis. Continue on IV unasyn as per ID. Wound cx growing enterococcus faecalis, klebsiella. Oxycodone prn for pain.S/p partial 5th ray amputation & I&D right foot 8/27 as per podiatry   PVD: has decreased pedal pulses. ABI shows moderate occlusive disease bilateral lower extremity. S/p angiogram 8/24 which showed normal common femoral artery, profunda femoris artery, superficial femoral artery & popliteal artery had mild lesion of 30% or < just below the knee.   Aspiration pneumonia: intermittently coughing and choking of food & liquids. NPO. Speech consulted. Continue on IV unasyn   Decreased hearing: etiology unclear. Started on vosol-hc otic solution   Leukocytosis: continue to trend up secondary to above infection. Continue on IV abxs  DM2: HbA1c 8.9, poorly controlled. Continue on glargine, SSI w/ accuchecks  ESRD: on HD. Management per nephro   ACD: likely secondary to ACD w/ component of acute blood loss anemia secondary to recent surgery as stated above. Will transfuse 1 unit of pRBCs. Repeat H&H 4 hours post transfusion   Hyponatremia: managed w/ HD as per nephro   Chronic hypoxia: continue on supplemental oxygen, uses 3L [place]  Morbid obesity: BMI 46.9. Complicates overall care & prognosis.       DVT prophylaxis: heparin  Code Status: full  Family Communication: discussed pt's care w/ pt's husband at baseline and answered his questions  Disposition Plan: unclear, likely d/c back home   Level of care:  Med-Surg  Status is: Inpatient  Remains inpatient appropriate because:Ongoing diagnostic testing needed not appropriate for outpatient work up, IV treatments appropriate due to intensity of illness or inability to take PO, and Inpatient level of care appropriate due to severity of illness  Dispo: The patient is from: Home              Anticipated d/c is to: Home              Patient currently is not medically stable to d/c.   Difficult to place patient : unclear     Consultants:  Vascular surg ID  Procedures:   Antimicrobials: unasyn   Subjective: Pt c/o difficulty hearing   Objective: Vitals:   10/21/20 2011 10/22/20 0156 10/22/20 0532 10/22/20 0740  BP: 121/66 104/64 119/62 (!) 125/59  Pulse: 96 96 91 89  Resp: 20  20 18   Temp: 98 F (36.7 C)  98.4 F (36.9 C) 98.1 F (36.7 C)  TempSrc: Oral     SpO2: 91%  94% 96%  Weight:      Height:        Intake/Output Summary (Last 24 hours) at 10/22/2020 0748 Last data filed at 10/22/2020 0726 Gross per 24 hour  Intake 744.07 ml  Output 1500 ml  Net -755.93 ml    Filed Weights   10/15/20 1905 10/18/20 0928  Weight: 126.1 kg 126.1 kg    Examination:  General exam: Appears uncomfortable. Morbidly obese  Respiratory system: diminished breath sounds  Cardiovascular system: S1 & S2+. No rubs or clicks  Gastrointestinal system: Abd  is soft, NT, obese & hypoactive bowel sounds Central nervous system: Alert and awake. Moves all extremities  Psychiatry: Judgement and insight appears impaired. Flat mood and affect     Data Reviewed: I have personally reviewed following labs and imaging studies  CBC: Recent Labs  Lab 10/15/20 1958 10/16/20 1402 10/18/20 0602 10/19/20 0526 10/20/20 0757 10/21/20 0655 10/22/20 0523  WBC 22.1*   < > 20.7* 22.9* 27.7* 31.1* 31.3*  NEUTROABS 19.0*  --   --   --   --   --   --   HGB 9.8*   < > 8.6* 7.9* 8.6* 8.3* 6.8*  HCT 30.6*   < > 26.6* 24.3* 26.6* 25.7* 21.0*  MCV 96.5   < >  94.0 94.9 93.3 92.1 95.5  PLT 358   < > 328 351 396 399 376   < > = values in this interval not displayed.   Basic Metabolic Panel: Recent Labs  Lab 10/18/20 0602 10/19/20 0526 10/20/20 0757 10/21/20 0104 10/21/20 0655 10/22/20 0523  NA 130* 126* 126*  --  126* 128*  K 4.2 4.7 4.4  --  5.0 4.8  CL 89* 85* 85*  --  87* 87*  CO2 25 24 25   --  23 26  GLUCOSE 180* 325* 239*  --  126* 215*  BUN 48* 61* 45*  --  57* 41*  CREATININE 8.03* 8.86* 6.99*  --  8.04* 6.01*  CALCIUM 8.7* 7.8* 7.8*  --  7.8* 7.7*  MG 1.8 1.8 1.9 2.0 2.1  --   PHOS  --   --  4.5  --   --   --    GFR: Estimated Creatinine Clearance: 15 mL/min (A) (by C-G formula based on SCr of 6.01 mg/dL (H)). Liver Function Tests: Recent Labs  Lab 10/15/20 1958 10/16/20 0347 10/22/20 0523  AST 64* 45* 60*  ALT 59* 47* 22  ALKPHOS 559* 474* 477*  BILITOT 0.7 0.9 1.0  PROT 8.3* 7.4 6.7  ALBUMIN 3.3* 2.7* 2.4*   No results for input(s): LIPASE, AMYLASE in the last 168 hours. No results for input(s): AMMONIA in the last 168 hours. Coagulation Profile: Recent Labs  Lab 10/15/20 1958  INR 1.2   Cardiac Enzymes: No results for input(s): CKTOTAL, CKMB, CKMBINDEX, TROPONINI in the last 168 hours. BNP (last 3 results) No results for input(s): PROBNP in the last 8760 hours. HbA1C: No results for input(s): HGBA1C in the last 72 hours. CBG: Recent Labs  Lab 10/20/20 2045 10/21/20 0800 10/21/20 1535 10/21/20 1646 10/21/20 2133  GLUCAP 217* 127* 141* 155* 212*   Lipid Profile: No results for input(s): CHOL, HDL, LDLCALC, TRIG, CHOLHDL, LDLDIRECT in the last 72 hours. Thyroid Function Tests: No results for input(s): TSH, T4TOTAL, FREET4, T3FREE, THYROIDAB in the last 72 hours. Anemia Panel: No results for input(s): VITAMINB12, FOLATE, FERRITIN, TIBC, IRON, RETICCTPCT in the last 72 hours. Sepsis Labs: Recent Labs  Lab 10/15/20 1958 10/16/20 0500 10/17/20 0352  PROCALCITON  --  3.26 4.86  LATICACIDVEN 1.8   --   --     Recent Results (from the past 240 hour(s))  Blood Culture (routine x 2)     Status: None   Collection Time: 10/15/20  8:03 PM   Specimen: BLOOD  Result Value Ref Range Status   Specimen Description BLOOD BLOOD RIGHT FOREARM  Final   Special Requests   Final    BOTTLES DRAWN AEROBIC AND ANAEROBIC Blood Culture adequate volume   Culture  Final    NO GROWTH 5 DAYS Performed at [facility], [address] [place], [place] [address]    Report Status 10/20/2020 FINAL  Final  Resp Panel by RT-PCR (Flu A&B, Covid) Nasopharyngeal Swab     Status: None   Collection Time: 10/15/20  8:26 PM   Specimen: Nasopharyngeal Swab; Nasopharyngeal(NP) swabs in vial transport medium  Result Value Ref Range Status   SARS Coronavirus 2 by RT PCR NEGATIVE NEGATIVE Final    Comment: (NOTE) SARS-CoV-2 target nucleic acids are NOT DETECTED.  The SARS-CoV-2 RNA is generally detectable in upper respiratory specimens during the acute phase of infection. The lowest concentration of SARS-CoV-2 viral copies this assay can detect is 138 copies/mL. A negative result does not preclude SARS-Cov-2 infection and should not be used as the sole basis for treatment or other patient management decisions. A negative result may occur with  improper specimen collection/handling, submission of specimen other than nasopharyngeal swab, presence of viral mutation(s) within the areas targeted by this assay, and inadequate number of viral copies(<138 copies/mL). A negative result must be combined with clinical observations, patient history, and epidemiological information. The expected result is Negative.  Fact Sheet for Patients:  EntrepreneurPulse.com.au  Fact Sheet for Healthcare Providers:  IncredibleEmployment.be  This test is no t yet approved or cleared by the [place] FDA and  has been authorized for detection and/or diagnosis of SARS-CoV-2 by FDA under  an Emergency Use Authorization (EUA). This EUA will remain  in effect (meaning this test can be used) for the duration of the COVID-19 declaration under Section 564(b)(1) of the Act, 21 U.S.C.section 360bbb-3(b)(1), unless the authorization is terminated  or revoked sooner.       Influenza A by PCR NEGATIVE NEGATIVE Final   Influenza B by PCR NEGATIVE NEGATIVE Final    Comment: (NOTE) The Xpert Xpress SARS-CoV-2/FLU/RSV plus assay is intended as an aid in the diagnosis of influenza from Nasopharyngeal swab specimens and should not be used as a sole basis for treatment. Nasal washings and aspirates are unacceptable for Xpert Xpress SARS-CoV-2/FLU/RSV testing.  Fact Sheet for Patients: EntrepreneurPulse.com.au  Fact Sheet for Healthcare Providers: IncredibleEmployment.be  This test is not yet approved or cleared by the [place] FDA and has been authorized for detection and/or diagnosis of SARS-CoV-2 by FDA under an Emergency Use Authorization (EUA). This EUA will remain in effect (meaning this test can be used) for the duration of the COVID-19 declaration under Section 564(b)(1) of the Act, 21 U.S.C. section 360bbb-3(b)(1), unless the authorization is terminated or revoked.  Performed at [facility], [address] [place], [place] [address]   Blood Culture (routine x 2)     Status: None   Collection Time: 10/15/20  8:26 PM   Specimen: BLOOD  Result Value Ref Range Status   Specimen Description BLOOD RIGHT ANTECUBITAL  Final   Special Requests   Final    BOTTLES DRAWN AEROBIC AND ANAEROBIC Blood Culture adequate volume   Culture   Final    NO GROWTH 5 DAYS Performed at [facility], [address], [place], [place] [address]    Report Status 10/20/2020 FINAL  Final  Aerobic Culture w Gram Stain (superficial specimen)     Status: None   Collection Time: 10/17/20  8:52 PM   Specimen: Wound  Result Value Ref  Range Status   Specimen Description WOUND  Final   Special Requests NONE  Final   Gram Stain   Final  RARE SQUAMOUS EPITHELIAL CELLS PRESENT FEW WBC PRESENT, PREDOMINANTLY MONONUCLEAR ABUNDANT GRAM VARIABLE ROD FEW GRAM POSITIVE COCCI    Culture   Final    MODERATE ENTEROCOCCUS FAECALIS MODERATE KLEBSIELLA OXYTOCA ABUNDANT DIPHTHEROIDS(CORYNEBACTERIUM SPECIES) Standardized susceptibility testing for this organism is not available.    Report Status 10/20/2020 FINAL  Final   Organism ID, Bacteria ENTEROCOCCUS FAECALIS  Final   Organism ID, Bacteria KLEBSIELLA OXYTOCA  Final      Susceptibility   Enterococcus faecalis - MIC*    AMPICILLIN <=2 SENSITIVE Sensitive     VANCOMYCIN 1 SENSITIVE Sensitive     GENTAMICIN SYNERGY RESISTANT Resistant     * MODERATE ENTEROCOCCUS FAECALIS   Klebsiella oxytoca - MIC*    AMPICILLIN >=32 RESISTANT Resistant     CEFAZOLIN <=4 SENSITIVE Sensitive     CEFEPIME <=0.12 SENSITIVE Sensitive     CEFTAZIDIME <=1 SENSITIVE Sensitive     CEFTRIAXONE <=0.25 SENSITIVE Sensitive     CIPROFLOXACIN <=0.25 SENSITIVE Sensitive     GENTAMICIN <=1 SENSITIVE Sensitive     IMIPENEM <=0.25 SENSITIVE Sensitive     TRIMETH/SULFA <=20 SENSITIVE Sensitive     AMPICILLIN/SULBACTAM 8 SENSITIVE Sensitive     PIP/TAZO <=4 SENSITIVE Sensitive     * MODERATE KLEBSIELLA OXYTOCA  Aerobic/Anaerobic Culture w Gram Stain (surgical/deep wound)     Status: None (Preliminary result)   Collection Time: 10/21/20  3:07 PM   Specimen: PATH Digit amputation; Tissue  Result Value Ref Range Status   Specimen Description   Final    FOOT  RIGHT 5TH DIGIT AMPUTATION Performed at [facility], [address] [place], [place] [address]    Special Requests   Final    NONE Performed at [facility], [place], [place] [address]    Gram Stain   Final    FEW SQUAMOUS EPITHELIAL CELLS PRESENT FEW WBC SEEN FEW GRAM POSITIVE COCCI Performed at [facility], [place] [address], [place], [place] [address]    Culture PENDING  Incomplete   Report Status PENDING  Incomplete         Radiology Studies: DG Foot Complete Right  Result Date: 10/21/2020 CLINICAL DATA:  Status post fifth metatarsal amputation. EXAM: RIGHT FOOT COMPLETE - 3+ VIEW COMPARISON:  None. FINDINGS: The patient is status post amputation of the distal fifth metatarsal and the fifth toe. Overlying dressings limit evaluation of fine cortical detail in these regions. No other acute abnormalities. Vascular calcifications are identified. IMPRESSION: Patient is status post amputation of the distal fifth metatarsal and the fifth toe. Electronically Signed   By: Dorise Bullion III M.D.   On: 10/21/2020 17:00        Scheduled Meds:  aspirin EC  81 mg Oral Daily   atorvastatin  80 mg Oral q1800   Chlorhexidine Gluconate Cloth  6 each Topical Daily   cholecalciferol  4,000 Units Oral Weekly   cinacalcet  60 mg Oral Daily   docusate sodium  100 mg Oral Daily   feeding supplement (NEPRO CARB STEADY)  237 mL Oral BID BM   ferric citrate  210 mg Oral TID WC   heparin  5,000 Units Subcutaneous Q8H   insulin aspart  0-5 Units Subcutaneous QHS   insulin aspart  0-9 Units Subcutaneous TID WC   insulin aspart  2 Units Subcutaneous TID WC   insulin glargine-yfgn  36 Units Subcutaneous QHS   linaclotide  290 mcg Oral q morning   mometasone-formoterol  2 puff Inhalation BID   pantoprazole  40 mg Oral Daily   sodium chloride flush  3 mL Intravenous Q12H   Continuous Infusions:  sodium chloride Stopped (10/22/20 0203)   ampicillin-sulbactam (UNASYN) IV Stopped (10/21/20 2140)     LOS: 7 days    Time spent: 39 mins     Wyvonnia Dusky, MD [facility] Pager [phone]  If 7PM-7AM, please contact night-coverage 10/22/2020, 7:48 AM

## 2020-10-22 NOTE — Progress Notes (Signed)
[facility]  PROGRESS NOTE   Subjective:   Awake and alert. Family at bedside. Had to amputation yesterday.  Objective:  Vital signs in last 24 hours:  Temp:  [97.1 F (36.2 C)-98.4 F (36.9 C)] 98.1 F (36.7 C) (08/28 0740) Pulse Rate:  [67-108] 89 (08/28 0740) Resp:  [13-34] 18 (08/28 0740) BP: (86-127)/(47-79) 125/59 (08/28 0740) SpO2:  [91 %-99 %] 96 % (08/28 0740)  Weight change:  Filed Weights   10/15/20 1905 10/18/20 0928  Weight: 126.1 kg 126.1 kg    Intake/Output: I/O last 3 completed shifts: In: 800 [P.O.:600; IV Piggyback:200] Out: 1500 [Other:1500]   Intake/Output this shift:  Total I/O In: 144.1 [I.V.:44.1; IV Piggyback:100] Out: -   Physical Exam: General:  No acute distress  Head:  Normocephalic, atraumatic. Moist oral mucosal membranes  Eyes:  Anicteric  Neck:  Supple  Lungs:   Clear to auscultation, normal effort  Heart:  S1S2 no rubs  Abdomen:   Soft, nontender, bowel sounds present  Extremities:  peripheral edema.  Neurologic:  Awake, alert, following commands  Skin:  No lesions  Access:     Basic Metabolic Panel: Recent Labs  Lab 10/18/20 0602 10/19/20 0526 10/20/20 0757 10/21/20 0104 10/21/20 0655 10/22/20 0523  NA 130* 126* 126*  --  126* 128*  K 4.2 4.7 4.4  --  5.0 4.8  CL 89* 85* 85*  --  87* 87*  CO2 25 24 25   --  23 26  GLUCOSE 180* 325* 239*  --  126* 215*  BUN 48* 61* 45*  --  57* 41*  CREATININE 8.03* 8.86* 6.99*  --  8.04* 6.01*  CALCIUM 8.7* 7.8* 7.8*  --  7.8* 7.7*  MG 1.8 1.8 1.9 2.0 2.1  --   PHOS  --   --  4.5  --   --   --     CBC: Recent Labs  Lab 10/15/20 1958 10/16/20 1402 10/18/20 0602 10/19/20 0526 10/20/20 0757 10/21/20 0655 10/22/20 0523  WBC 22.1*   < > 20.7* 22.9* 27.7* 31.1* 31.3*  NEUTROABS 19.0*  --   --   --   --   --   --   HGB 9.8*   < > 8.6* 7.9* 8.6* 8.3* 6.8*  HCT 30.6*   < > 26.6* 24.3* 26.6* 25.7* 21.0*  MCV 96.5   < > 94.0 94.9 93.3 92.1 95.5  PLT 358   < > 328  351 396 399 376   < > = values in this interval not displayed.     Urinalysis: No results for input(s): COLORURINE, LABSPEC, PHURINE, GLUCOSEU, HGBUR, BILIRUBINUR, KETONESUR, PROTEINUR, UROBILINOGEN, NITRITE, LEUKOCYTESUR in the last 72 hours.  Invalid input(s): APPERANCEUR    Imaging: DG Foot Complete Right  Result Date: 10/21/2020 CLINICAL DATA:  Status post fifth metatarsal amputation. EXAM: RIGHT FOOT COMPLETE - 3+ VIEW COMPARISON:  None. FINDINGS: The patient is status post amputation of the distal fifth metatarsal and the fifth toe. Overlying dressings limit evaluation of fine cortical detail in these regions. No other acute abnormalities. Vascular calcifications are identified. IMPRESSION: Patient is status post amputation of the distal fifth metatarsal and the fifth toe. Electronically Signed   By: Dorise Bullion III M.D.   On: 10/21/2020 17:00     Medications:    sodium chloride Stopped (10/22/20 0203)   ampicillin-sulbactam (UNASYN) IV Stopped (10/21/20 2140)    sodium chloride   Intravenous Once   aspirin EC  81 mg Oral  Daily   atorvastatin  80 mg Oral q1800   Chlorhexidine Gluconate Cloth  6 each Topical Daily   cholecalciferol  4,000 Units Oral Weekly   cinacalcet  60 mg Oral Daily   docusate sodium  100 mg Oral Daily   feeding supplement (NEPRO CARB STEADY)  237 mL Oral BID BM   ferric citrate  210 mg Oral TID WC   heparin  5,000 Units Subcutaneous Q8H   insulin aspart  0-5 Units Subcutaneous QHS   insulin aspart  0-9 Units Subcutaneous TID WC   insulin aspart  2 Units Subcutaneous TID WC   insulin glargine-yfgn  36 Units Subcutaneous QHS   linaclotide  290 mcg Oral q morning   mometasone-formoterol  2 puff Inhalation BID   pantoprazole  40 mg Oral Daily   sodium chloride flush  3 mL Intravenous Q12H    Assessment/ Plan:     Principal Problem:   Diabetic foot ulcer ([place]) Active Problems:   Diabetes ([place])   Essential hypertension   DNR (do not  resuscitate) discussion   End-stage renal disease on hemodialysis ([place])   Chronic respiratory failure with hypoxia and hypercapnia (HCC)   Decreased pedal pulses   50 year old obese female with history of hypertension, diabetes, coronary artery disease, congestive heart failure, peripheral vascular disease, end-stage renal disease on dialysis now admitted with history of nonhealing foot ulcers and gangrene with sepsis.   #1: ESRD: Patient is on Tuesday Thursday Saturday schedule.  Tolerating dialysis well with fluid removal.   #2: Hypotension: Presently off of antihypertensive medications.  We will give albumin during dialysis for fluid removal.   #3: PVD/nonhealing foot ulcers.  S/p right foot amputation yesterday.   #4: Sepsis: Patient is on IV Unasyn as per the advice of ID specialist.   #5: Anemia: We will continue to monitor hemoglobin levels closely.  We will follow anemia protocols.   #6: Secondary hyperparathyroidism: We will monitor PTH levels and also phosphorus levels.  LOS: [place], MD [facility] 8/28/202210:41 AM

## 2020-10-22 NOTE — Progress Notes (Signed)
Patient with an episode of choking with this RN and Dr. Jimmye Norman present. Speech eval & treat orders entered.

## 2020-10-23 DIAGNOSIS — D62 Acute posthemorrhagic anemia: Secondary | ICD-10-CM

## 2020-10-23 DIAGNOSIS — L97519 Non-pressure chronic ulcer of other part of right foot with unspecified severity: Secondary | ICD-10-CM | POA: Diagnosis not present

## 2020-10-23 DIAGNOSIS — L97514 Non-pressure chronic ulcer of other part of right foot with necrosis of bone: Secondary | ICD-10-CM | POA: Diagnosis not present

## 2020-10-23 DIAGNOSIS — J690 Pneumonitis due to inhalation of food and vomit: Secondary | ICD-10-CM | POA: Diagnosis not present

## 2020-10-23 DIAGNOSIS — E11621 Type 2 diabetes mellitus with foot ulcer: Secondary | ICD-10-CM | POA: Diagnosis not present

## 2020-10-23 LAB — COMPREHENSIVE METABOLIC PANEL
ALT: 31 U/L (ref 0–44)
AST: 88 U/L — ABNORMAL HIGH (ref 15–41)
Albumin: 2.4 g/dL — ABNORMAL LOW (ref 3.5–5.0)
Alkaline Phosphatase: 474 U/L — ABNORMAL HIGH (ref 38–126)
Anion gap: 15 (ref 5–15)
BUN: 53 mg/dL — ABNORMAL HIGH (ref 6–20)
CO2: 23 mmol/L (ref 22–32)
Calcium: 7.3 mg/dL — ABNORMAL LOW (ref 8.9–10.3)
Chloride: 88 mmol/L — ABNORMAL LOW (ref 98–111)
Creatinine, Ser: 7.09 mg/dL — ABNORMAL HIGH (ref 0.44–1.00)
GFR, Estimated: 7 mL/min — ABNORMAL LOW (ref >60)
Glucose, Bld: 131 mg/dL — ABNORMAL HIGH (ref 70–99)
Potassium: 4.5 mmol/L (ref 3.5–5.1)
Sodium: 126 mmol/L — ABNORMAL LOW (ref 135–145)
Total Bilirubin: 1.1 mg/dL (ref 0.3–1.2)
Total Protein: 6.8 g/dL (ref 6.5–8.1)

## 2020-10-23 LAB — CBC
HCT: 24.7 % — ABNORMAL LOW (ref 36.0–46.0)
Hemoglobin: 8.2 g/dL — ABNORMAL LOW (ref 12.0–15.0)
MCH: 30.5 pg (ref 26.0–34.0)
MCHC: 33.2 g/dL (ref 30.0–36.0)
MCV: 91.8 fL (ref 80.0–100.0)
Platelets: 400 10*3/uL (ref 150–400)
RBC: 2.69 MIL/uL — ABNORMAL LOW (ref 3.87–5.11)
RDW: 14.5 % (ref 11.5–15.5)
WBC: 30.7 10*3/uL — ABNORMAL HIGH (ref 4.0–10.5)
nRBC: 0.3 % — ABNORMAL HIGH (ref 0.0–0.2)

## 2020-10-23 LAB — GLUCOSE, CAPILLARY
Glucose-Capillary: 123 mg/dL — ABNORMAL HIGH (ref 70–99)
Glucose-Capillary: 98 mg/dL (ref 70–99)
Glucose-Capillary: 207 mg/dL — ABNORMAL HIGH (ref 70–99)
Glucose-Capillary: 192 mg/dL — ABNORMAL HIGH (ref 70–99)

## 2020-10-23 MED ORDER — PIPERACILLIN-TAZOBACTAM IN DEX 2-0.25 GM/50ML IV SOLN
2.2500 g | Freq: Three times a day (TID) | INTRAVENOUS | Status: DC
  Administered 2020-10-23 – 2020-10-29 (×19): 2.25 g via INTRAVENOUS
  Filled 2020-10-23 (×21): qty 50

## 2020-10-23 MED ORDER — HYDROMORPHONE HCL 1 MG/ML IJ SOLN
1.0000 mg | INTRAMUSCULAR | Status: DC | PRN
  Administered 2020-10-23 – 2020-10-29 (×10): 1 mg via INTRAVENOUS
  Filled 2020-10-23 (×10): qty 1

## 2020-10-23 NOTE — Progress Notes (Signed)
[facility]  ROUNDING NOTE   Subjective:   [name] was admitted to [facility] on 10/15/2020 for Gangrene [facility]) [I96] Diabetic foot ulcer ([place]) [E11.621, L97.509] Decreased pedal pulses [R09.89] Sepsis, due to unspecified organism, unspecified whether acute organ dysfunction present [facility]) [A41.9]  Last hemodialysis treatment was Saturday, 8/20. She was having hypotension on her treatment and she took midodrine to complete her treatment.   Patient seen resting in bed Alert and oriented Husband at bedside  Complains of poor pain management with recent amputation  Currently NPO for witnessed aspiration event   Objective:  Vital signs in last 24 hours:  Temp:  [97.6 F (36.4 C)-98.8 F (37.1 C)] 98.1 F (36.7 C) (08/29 0811) Pulse Rate:  [78-92] 78 (08/29 0811) Resp:  [15-20] 15 (08/29 0811) BP: (83-140)/(55-130) 83/60 (08/29 0811) SpO2:  [93 %-100 %] 99 % (08/29 0811)  Weight change:  Filed Weights   10/15/20 1905 10/18/20 0928  Weight: 126.1 kg 126.1 kg    Intake/Output: I/O last 3 completed shifts: In: 1309.6 [P.O.:720; I.V.:69.6; Blood:320; IV Piggyback:200] Out: -    Intake/Output this shift:  No intake/output data recorded.  Physical Exam: General: NAD, laying in bed  Head: Normocephalic, atraumatic. Moist oral mucosal membranes  Eyes: Anicteric  Lungs:  Clear to auscultation, O2 2L  Heart: Regular rate and rhythm  Abdomen:  Soft, nontender  Extremities:  trace peripheral edema. Right foot fifth toe amputation on 10/21/20, ace wrapped  Neurologic: Nonfocal, moving all four extremities  Skin: No lesions  Access: Left AVF    Basic Metabolic Panel: Recent Labs  Lab 10/18/20 0602 10/19/20 0526 10/20/20 0757 10/21/20 0104 10/21/20 0655 10/22/20 0523 10/23/20 0456  NA 130* 126* 126*  --  126* 128* 126*  K 4.2 4.7 4.4  --  5.0 4.8 4.5  CL 89* 85* 85*  --  87* 87* 88*  CO2 25 24 25   --  23 26 23   GLUCOSE 180* 325* 239*  --  126* 215*  131*  BUN 48* 61* 45*  --  57* 41* 53*  CREATININE 8.03* 8.86* 6.99*  --  8.04* 6.01* 7.09*  CALCIUM 8.7* 7.8* 7.8*  --  7.8* 7.7* 7.3*  MG 1.8 1.8 1.9 2.0 2.1  --   --   PHOS  --   --  4.5  --   --   --   --      Liver Function Tests: Recent Labs  Lab 10/22/20 0523 10/23/20 0456  AST 60* 88*  ALT 22 31  ALKPHOS 477* 474*  BILITOT 1.0 1.1  PROT 6.7 6.8  ALBUMIN 2.4* 2.4*    No results for input(s): LIPASE, AMYLASE in the last 168 hours. No results for input(s): AMMONIA in the last 168 hours.  CBC: Recent Labs  Lab 10/19/20 0526 10/20/20 0757 10/21/20 0655 10/22/20 0523 10/22/20 2242 10/23/20 0456  WBC 22.9* 27.7* 31.1* 31.3*  --  30.7*  HGB 7.9* 8.6* 8.3* 6.8* 8.3* 8.2*  HCT 24.3* 26.6* 25.7* 21.0* 24.7* 24.7*  MCV 94.9 93.3 92.1 95.5  --  91.8  PLT 351 396 399 376  --  400     Cardiac Enzymes: No results for input(s): CKTOTAL, CKMB, CKMBINDEX, TROPONINI in the last 168 hours.  BNP: Invalid input(s): POCBNP  CBG: Recent Labs  Lab 10/22/20 0855 10/22/20 1401 10/22/20 2251 10/23/20 0820 10/23/20 1202  GLUCAP 229* 240* 159* 123* 18     Microbiology: Results for orders placed or performed during  the hospital encounter of 10/15/20  Blood Culture (routine x 2)     Status: None   Collection Time: 10/15/20  8:03 PM   Specimen: BLOOD  Result Value Ref Range Status   Specimen Description BLOOD BLOOD RIGHT FOREARM  Final   Special Requests   Final    BOTTLES DRAWN AEROBIC AND ANAEROBIC Blood Culture adequate volume   Culture   Final    NO GROWTH 5 DAYS Performed at [facility], [address] [place], [place] [address]    Report Status 10/20/2020 FINAL  Final  Resp Panel by RT-PCR (Flu A&B, Covid) Nasopharyngeal Swab     Status: None   Collection Time: 10/15/20  8:26 PM   Specimen: Nasopharyngeal Swab; Nasopharyngeal(NP) swabs in vial transport medium  Result Value Ref Range Status   SARS Coronavirus 2 by RT PCR NEGATIVE NEGATIVE Final     Comment: (NOTE) SARS-CoV-2 target nucleic acids are NOT DETECTED.  The SARS-CoV-2 RNA is generally detectable in upper respiratory specimens during the acute phase of infection. The lowest concentration of SARS-CoV-2 viral copies this assay can detect is 138 copies/mL. A negative result does not preclude SARS-Cov-2 infection and should not be used as the sole basis for treatment or other patient management decisions. A negative result may occur with  improper specimen collection/handling, submission of specimen other than nasopharyngeal swab, presence of viral mutation(s) within the areas targeted by this assay, and inadequate number of viral copies(<138 copies/mL). A negative result must be combined with clinical observations, patient history, and epidemiological information. The expected result is Negative.  Fact Sheet for Patients:  EntrepreneurPulse.com.au  Fact Sheet for Healthcare Providers:  IncredibleEmployment.be  This test is no t yet approved or cleared by the [place] FDA and  has been authorized for detection and/or diagnosis of SARS-CoV-2 by FDA under an Emergency Use Authorization (EUA). This EUA will remain  in effect (meaning this test can be used) for the duration of the COVID-19 declaration under Section 564(b)(1) of the Act, 21 U.S.C.section 360bbb-3(b)(1), unless the authorization is terminated  or revoked sooner.       Influenza A by PCR NEGATIVE NEGATIVE Final   Influenza B by PCR NEGATIVE NEGATIVE Final    Comment: (NOTE) The Xpert Xpress SARS-CoV-2/FLU/RSV plus assay is intended as an aid in the diagnosis of influenza from Nasopharyngeal swab specimens and should not be used as a sole basis for treatment. Nasal washings and aspirates are unacceptable for Xpert Xpress SARS-CoV-2/FLU/RSV testing.  Fact Sheet for Patients: EntrepreneurPulse.com.au  Fact Sheet for Healthcare  Providers: IncredibleEmployment.be  This test is not yet approved or cleared by the [place] FDA and has been authorized for detection and/or diagnosis of SARS-CoV-2 by FDA under an Emergency Use Authorization (EUA). This EUA will remain in effect (meaning this test can be used) for the duration of the COVID-19 declaration under Section 564(b)(1) of the Act, 21 U.S.C. section 360bbb-3(b)(1), unless the authorization is terminated or revoked.  Performed at [facility], [address] [place], [place] [address]   Blood Culture (routine x 2)     Status: None   Collection Time: 10/15/20  8:26 PM   Specimen: BLOOD  Result Value Ref Range Status   Specimen Description BLOOD RIGHT ANTECUBITAL  Final   Special Requests   Final    BOTTLES DRAWN AEROBIC AND ANAEROBIC Blood Culture adequate volume   Culture   Final    NO GROWTH 5 DAYS Performed at [facility],  [place], [place] [address]    Report Status 10/20/2020 FINAL  Final  Aerobic Culture w Gram Stain (superficial specimen)     Status: None   Collection Time: 10/17/20  8:52 PM   Specimen: Wound  Result Value Ref Range Status   Specimen Description WOUND  Final   Special Requests NONE  Final   Gram Stain   Final    RARE SQUAMOUS EPITHELIAL CELLS PRESENT FEW WBC PRESENT, PREDOMINANTLY MONONUCLEAR ABUNDANT GRAM VARIABLE ROD FEW GRAM POSITIVE COCCI    Culture   Final    MODERATE ENTEROCOCCUS FAECALIS MODERATE KLEBSIELLA OXYTOCA ABUNDANT DIPHTHEROIDS(CORYNEBACTERIUM SPECIES) Standardized susceptibility testing for this organism is not available.    Report Status 10/20/2020 FINAL  Final   Organism ID, Bacteria ENTEROCOCCUS FAECALIS  Final   Organism ID, Bacteria KLEBSIELLA OXYTOCA  Final      Susceptibility   Enterococcus faecalis - MIC*    AMPICILLIN <=2 SENSITIVE Sensitive     VANCOMYCIN 1 SENSITIVE Sensitive     GENTAMICIN SYNERGY RESISTANT Resistant     *  MODERATE ENTEROCOCCUS FAECALIS   Klebsiella oxytoca - MIC*    AMPICILLIN >=32 RESISTANT Resistant     CEFAZOLIN <=4 SENSITIVE Sensitive     CEFEPIME <=0.12 SENSITIVE Sensitive     CEFTAZIDIME <=1 SENSITIVE Sensitive     CEFTRIAXONE <=0.25 SENSITIVE Sensitive     CIPROFLOXACIN <=0.25 SENSITIVE Sensitive     GENTAMICIN <=1 SENSITIVE Sensitive     IMIPENEM <=0.25 SENSITIVE Sensitive     TRIMETH/SULFA <=20 SENSITIVE Sensitive     AMPICILLIN/SULBACTAM 8 SENSITIVE Sensitive     PIP/TAZO <=4 SENSITIVE Sensitive     * MODERATE KLEBSIELLA OXYTOCA  Aerobic/Anaerobic Culture w Gram Stain (surgical/deep wound)     Status: None (Preliminary result)   Collection Time: 10/21/20  3:07 PM   Specimen: PATH Digit amputation; Tissue  Result Value Ref Range Status   Specimen Description   Final    FOOT  RIGHT 5TH DIGIT AMPUTATION Performed at [facility], [address], [place], [place] [address]    Special Requests   Final    NONE Performed at [facility], [place], [place] [address]    Gram Stain   Final    FEW SQUAMOUS EPITHELIAL CELLS PRESENT FEW WBC SEEN FEW GRAM POSITIVE COCCI Performed at [facility], [place] [address], [place], [place] [address]    Culture PENDING  Incomplete   Report Status PENDING  Incomplete    Coagulation Studies: No results for input(s): LABPROT, INR in the last 72 hours.   Urinalysis: No results for input(s): COLORURINE, LABSPEC, PHURINE, GLUCOSEU, HGBUR, BILIRUBINUR, KETONESUR, PROTEINUR, UROBILINOGEN, NITRITE, LEUKOCYTESUR in the last 72 hours.  Invalid input(s): APPERANCEUR    Imaging: DG Chest Port 1 View  Result Date: 10/22/2020 CLINICAL DATA:  Dyspnea, cough. EXAM: PORTABLE CHEST 1 VIEW COMPARISON:  October 15, 2020. FINDINGS: The heart size and mediastinal contours are within normal limits. No pneumothorax is noted. Mild left lingular subsegmental atelectasis is noted. Right middle lobe opacity is noted  concerning for atelectasis or pneumonia and possible right pleural effusion. The visualized skeletal structures are unremarkable. IMPRESSION: Right middle lobe opacity is noted concerning for atelectasis or pneumonia and possible right pleural effusion. Electronically Signed   By: Marijo Conception M.D.   On: 10/22/2020 11:33   DG Foot Complete Right  Result Date: 10/21/2020 CLINICAL DATA:  Status post fifth metatarsal amputation. EXAM: RIGHT FOOT COMPLETE -  3+ VIEW COMPARISON:  None. FINDINGS: The patient is status post amputation of the distal fifth metatarsal and the fifth toe. Overlying dressings limit evaluation of fine cortical detail in these regions. No other acute abnormalities. Vascular calcifications are identified. IMPRESSION: Patient is status post amputation of the distal fifth metatarsal and the fifth toe. Electronically Signed   By: Dorise Bullion III M.D.   On: 10/21/2020 17:00     Medications:    sodium chloride Stopped (10/22/20 1200)   ampicillin-sulbactam (UNASYN) IV 3 g (10/23/20 1002)   diphenhydrAMINE      acetic acid-hydrocortisone  3 drop Both EARS Q6H   aspirin EC  81 mg Oral Daily   atorvastatin  80 mg Oral q1800   Chlorhexidine Gluconate Cloth  6 each Topical Daily   cholecalciferol  4,000 Units Oral Weekly   cinacalcet  60 mg Oral Daily   docusate sodium  100 mg Oral Daily   feeding supplement (NEPRO CARB STEADY)  237 mL Oral BID BM   ferric citrate  210 mg Oral TID WC   heparin  5,000 Units Subcutaneous Q8H   insulin aspart  0-5 Units Subcutaneous QHS   insulin aspart  0-9 Units Subcutaneous TID WC   insulin aspart  2 Units Subcutaneous TID WC   insulin glargine-yfgn  36 Units Subcutaneous QHS   linaclotide  290 mcg Oral q morning   mometasone-formoterol  2 puff Inhalation BID   pantoprazole  40 mg Oral Daily   sodium chloride flush  3 mL Intravenous Q12H   sodium chloride, acetaminophen, albuterol, albuterol, ALPRAZolam, alum & mag hydroxide-simeth,  calcium carbonate, dextromethorphan-guaiFENesin, diphenhydrAMINE, diphenhydrAMINE, hydrALAZINE, HYDROmorphone (DILAUDID) injection, ipratropium-albuterol, lactulose, LORazepam, metoprolol tartrate, ondansetron (ZOFRAN) IV, ondansetron, oxyCODONE, phenol, polyethylene glycol, sodium chloride flush, traZODone  Assessment/ Plan:  [name] is a 50 y.o. black female with end stage renal disease on hemodialysis, diabetes mellitus type II, hypertension, congestive heart failure, COPD, who is admitted to [facility] on 10/15/2020 for Gangrene [facility]) [I96] Diabetic foot ulcer ([place]) [M78.675, L97.509] Decreased pedal pulses [R09.89] Sepsis, due to unspecified organism, unspecified whether acute organ dysfunction present [facility]) [A41.9]  [facility] Left AVF 127.5kg  End Stage Renal Disease: last dialysis was 10/14/20. Received dialysis Saturday, tolerated well. Next treatment scheduled for Tuesday.   Hypotension: secondary to infection. Holding home regimen torsemide, losartan, clonidine. 83/60 Will monitor for need of Midodrine with HD treatments  Anemia with chronic kidney disease: hemoglobin 8.2. Low dose EPO with next HD treatment.   Secondary Hyperparathyroidism with hyeprphosphatemia: there is risk for calciphylaxis. Outpatinet labs on 8/9: phos 10.6, calcium 8.2, PTH 1456.  - resume  cinacalcet and sevelamer with meals - Phosphorus at goal    Diabetes mellitus type II with chronic kidney disease: insulin dependent. Complicated with diabetic foot ulcer. History of poor control. Hemoglobin A1c 9.2% on 09/19/2020.  - Appreciate podiatry and vascular input.  - empiric ceftriaxone and metronidazole.  - Vascular performed right lower angiogram today. 30% mild lesion found popliteal artery. Deemed adequate for wound healing.  - Podiatry performed right foot, partial fifth toe amputation on 10/21/20. Left open to drain.      LOS: 8 Stephanine Reas 8/29/202212:35 PM

## 2020-10-23 NOTE — TOC Progression Note (Signed)
Transition of Care [facility]) - Progression Note    Patient Details  Name: [name] MRN: [id number] Date of Birth: November 25, 1970  Transition of Care [facility]) CM/SW [place], RN Phone Number: 10/23/2020, 5:10 PM  Clinical Narrative:   RNCM discussed recommendation of SNF rehab.  Patient declined at first, importance of rehab explained, patient asked to have until tomorrow to consider rehab.  Care team advised,  TOC contact information given, TOC to follow to discharge.    Expected Discharge Plan: Home/Self Care Barriers to Discharge: Continued Medical Work up  Expected Discharge Plan and Services Expected Discharge Plan: Home/Self Care   Discharge Planning Services: CM Consult   Living arrangements for the past 2 months: Single Family Home                 DME Arranged: [place] rolling DME Agency: [employer] Date DME Agency Contacted: 10/19/20 Time DME Agency Contacted: [phone] Representative spoke with at DME Agency: [facility] Determinants of Health ([place]) Interventions    Readmission Risk Interventions No flowsheet data found.

## 2020-10-23 NOTE — Progress Notes (Signed)
PROGRESS NOTE    [name]  WNI:[id number] DOB: 1971-01-23 DOA: 10/15/2020 PCP: [name], NP   Assessment & Plan:   Principal Problem:   Diabetic foot ulcer ([place]) Active Problems:   Diabetes ([place])   Essential hypertension   DNR (do not resuscitate) discussion   End-stage renal disease on hemodialysis ([place])   Chronic respiratory failure with hypoxia and hypercapnia (HCC)   Decreased pedal pulses   Diabetic foot ulcer: w/ gangrene of 5th toe & osteomyelitis. Continue on IV unasyn as per ID. Wound cx growing enterococcus faecalis, klebsiella. Oxycodone prn for pain. S/p partial 5th ray amputation & I&D right foot 8/27 as per podiatry   PVD: has decreased pedal pulses. ABI shows moderate occlusive disease bilateral lower extremity. S/p angiogram 8/24 which showed normal common femoral artery, profunda femoris artery, superficial femoral artery & popliteal artery had mild lesion of 30% or < just below the knee.   Aspiration pneumonia: intermittently coughing & choking on foods & liquids. NPO. Speech consulted. Continue on IV unasyn   Decreased hearing: etiology unclear. Continue on vosol-hc solution. Improved today   Leukocytosis: trending down slightly, likely secondary to above infections. Continue on IV abxs   DM2: poorly controlled, HbA1c 8.9. Continue on glargine, SSI w/ accuchecks   ESRD: on HD. Management per nephro   ACD: likely secondary to ACD w/ component of acute blood loss anemia secondary to recent surgery as stated above. S/p 1 unit of pRBCs given 8/28. H&H are trending up   Hyponatremia:  will be managed w/ HD as per nephro   Chronic hypoxia: continue on supplemental oxygen, uses 3L [place]  Morbid obesity: BMI 46.9. Complicates overall care & prognosis      DVT prophylaxis: heparin  Code Status: full  Family Communication: discussed pt's care w/ pt's husband at bedside and answered his questions  Disposition Plan: unclear, likely d/c back home    Level of care: Med-Surg  Status is: Inpatient  Remains inpatient appropriate because:Ongoing diagnostic testing needed not appropriate for outpatient work up, IV treatments appropriate due to intensity of illness or inability to take PO, and Inpatient level of care appropriate due to severity of illness  Dispo: The patient is from: Home              Anticipated d/c is to: Home              Patient currently is not medically stable to d/c.   Difficult to place patient : unclear     Consultants:  Vascular surg ID  Procedures:   Antimicrobials: unasyn   Subjective: Pt c/o malaise   Objective: Vitals:   10/22/20 1819 10/22/20 1826 10/23/20 0013 10/23/20 0519  BP: 94/69 103/60 98/60 (!) 140/130  Pulse: 90  85 86  Resp: 18   18  Temp: 97.9 F (36.6 C)  98.6 F (37 C) 97.6 F (36.4 C)  TempSrc: Oral  Oral   SpO2: 97%  100% 93%  Weight:      Height:        Intake/Output Summary (Last 24 hours) at 10/23/2020 0745 Last data filed at 10/22/2020 1845 Gross per 24 hour  Intake 565.52 ml  Output --  Net 565.52 ml    Filed Weights   10/15/20 1905 10/18/20 0928  Weight: 126.1 kg 126.1 kg    Examination:  General exam: Appears calm & comfortable. Morbidly obese Respiratory system: decreased breath sounds b/l. No wheezes  Cardiovascular system: S1/S2+. No rubs or  clicks  Gastrointestinal system: Abd is soft, NT, obese & hypoactive bowel sounds  Central nervous system: Alert and oriented. Moves all extremities  Psychiatry: Judgement and insight appear normal. Flat mood and affect    Data Reviewed: I have personally reviewed following labs and imaging studies  CBC: Recent Labs  Lab 10/19/20 0526 10/20/20 0757 10/21/20 0655 10/22/20 0523 10/22/20 2242 10/23/20 0456  WBC 22.9* 27.7* 31.1* 31.3*  --  30.7*  HGB 7.9* 8.6* 8.3* 6.8* 8.3* 8.2*  HCT 24.3* 26.6* 25.7* 21.0* 24.7* 24.7*  MCV 94.9 93.3 92.1 95.5  --  91.8  PLT 351 396 399 376  --  149   Basic  Metabolic Panel: Recent Labs  Lab 10/18/20 0602 10/19/20 0526 10/20/20 0757 10/21/20 0104 10/21/20 0655 10/22/20 0523 10/23/20 0456  NA 130* 126* 126*  --  126* 128* 126*  K 4.2 4.7 4.4  --  5.0 4.8 4.5  CL 89* 85* 85*  --  87* 87* 88*  CO2 25 24 25   --  23 26 23   GLUCOSE 180* 325* 239*  --  126* 215* 131*  BUN 48* 61* 45*  --  57* 41* 53*  CREATININE 8.03* 8.86* 6.99*  --  8.04* 6.01* 7.09*  CALCIUM 8.7* 7.8* 7.8*  --  7.8* 7.7* 7.3*  MG 1.8 1.8 1.9 2.0 2.1  --   --   PHOS  --   --  4.5  --   --   --   --    GFR: Estimated Creatinine Clearance: 12.7 mL/min (A) (by C-G formula based on SCr of 7.09 mg/dL (H)). Liver Function Tests: Recent Labs  Lab 10/22/20 0523 10/23/20 0456  AST 60* 88*  ALT 22 31  ALKPHOS 477* 474*  BILITOT 1.0 1.1  PROT 6.7 6.8  ALBUMIN 2.4* 2.4*   No results for input(s): LIPASE, AMYLASE in the last 168 hours. No results for input(s): AMMONIA in the last 168 hours. Coagulation Profile: No results for input(s): INR, PROTIME in the last 168 hours.  Cardiac Enzymes: No results for input(s): CKTOTAL, CKMB, CKMBINDEX, TROPONINI in the last 168 hours. BNP (last 3 results) No results for input(s): PROBNP in the last 8760 hours. HbA1C: No results for input(s): HGBA1C in the last 72 hours. CBG: Recent Labs  Lab 10/21/20 1646 10/21/20 2133 10/22/20 0855 10/22/20 1401 10/22/20 2251  GLUCAP 155* 212* 229* 240* 159*   Lipid Profile: No results for input(s): CHOL, HDL, LDLCALC, TRIG, CHOLHDL, LDLDIRECT in the last 72 hours. Thyroid Function Tests: No results for input(s): TSH, T4TOTAL, FREET4, T3FREE, THYROIDAB in the last 72 hours. Anemia Panel: No results for input(s): VITAMINB12, FOLATE, FERRITIN, TIBC, IRON, RETICCTPCT in the last 72 hours. Sepsis Labs: Recent Labs  Lab 10/17/20 0352  PROCALCITON 4.86    Recent Results (from the past 240 hour(s))  Blood Culture (routine x 2)     Status: None   Collection Time: 10/15/20  8:03 PM    Specimen: BLOOD  Result Value Ref Range Status   Specimen Description BLOOD BLOOD RIGHT FOREARM  Final   Special Requests   Final    BOTTLES DRAWN AEROBIC AND ANAEROBIC Blood Culture adequate volume   Culture   Final    NO GROWTH 5 DAYS Performed at [facility], [address], [place], [place] [address]    Report Status 10/20/2020 FINAL  Final  Resp Panel by RT-PCR (Flu A&B, Covid) Nasopharyngeal Swab     Status: None   Collection Time: 10/15/20  8:26 PM   Specimen: Nasopharyngeal Swab; Nasopharyngeal(NP) swabs in vial transport medium  Result Value Ref Range Status   SARS Coronavirus 2 by RT PCR NEGATIVE NEGATIVE Final    Comment: (NOTE) SARS-CoV-2 target nucleic acids are NOT DETECTED.  The SARS-CoV-2 RNA is generally detectable in upper respiratory specimens during the acute phase of infection. The lowest concentration of SARS-CoV-2 viral copies this assay can detect is 138 copies/mL. A negative result does not preclude SARS-Cov-2 infection and should not be used as the sole basis for treatment or other patient management decisions. A negative result may occur with  improper specimen collection/handling, submission of specimen other than nasopharyngeal swab, presence of viral mutation(s) within the areas targeted by this assay, and inadequate number of viral copies(<138 copies/mL). A negative result must be combined with clinical observations, patient history, and epidemiological information. The expected result is Negative.  Fact Sheet for Patients:  EntrepreneurPulse.com.au  Fact Sheet for Healthcare Providers:  IncredibleEmployment.be  This test is no t yet approved or cleared by the [place] FDA and  has been authorized for detection and/or diagnosis of SARS-CoV-2 by FDA under an Emergency Use Authorization (EUA). This EUA will remain  in effect (meaning this test can be used) for the duration of the COVID-19  declaration under Section 564(b)(1) of the Act, 21 U.S.C.section 360bbb-3(b)(1), unless the authorization is terminated  or revoked sooner.       Influenza A by PCR NEGATIVE NEGATIVE Final   Influenza B by PCR NEGATIVE NEGATIVE Final    Comment: (NOTE) The Xpert Xpress SARS-CoV-2/FLU/RSV plus assay is intended as an aid in the diagnosis of influenza from Nasopharyngeal swab specimens and should not be used as a sole basis for treatment. Nasal washings and aspirates are unacceptable for Xpert Xpress SARS-CoV-2/FLU/RSV testing.  Fact Sheet for Patients: EntrepreneurPulse.com.au  Fact Sheet for Healthcare Providers: IncredibleEmployment.be  This test is not yet approved or cleared by the [place] FDA and has been authorized for detection and/or diagnosis of SARS-CoV-2 by FDA under an Emergency Use Authorization (EUA). This EUA will remain in effect (meaning this test can be used) for the duration of the COVID-19 declaration under Section 564(b)(1) of the Act, 21 U.S.C. section 360bbb-3(b)(1), unless the authorization is terminated or revoked.  Performed at [facility], [address] [place], [place] [address]   Blood Culture (routine x 2)     Status: None   Collection Time: 10/15/20  8:26 PM   Specimen: BLOOD  Result Value Ref Range Status   Specimen Description BLOOD RIGHT ANTECUBITAL  Final   Special Requests   Final    BOTTLES DRAWN AEROBIC AND ANAEROBIC Blood Culture adequate volume   Culture   Final    NO GROWTH 5 DAYS Performed at [facility], [address] [place], [place] [address]    Report Status 10/20/2020 FINAL  Final  Aerobic Culture w Gram Stain (superficial specimen)     Status: None   Collection Time: 10/17/20  8:52 PM   Specimen: Wound  Result Value Ref Range Status   Specimen Description WOUND  Final   Special Requests NONE  Final   Gram Stain   Final    RARE SQUAMOUS EPITHELIAL CELLS  PRESENT FEW WBC PRESENT, PREDOMINANTLY MONONUCLEAR ABUNDANT GRAM VARIABLE ROD FEW GRAM POSITIVE COCCI    Culture   Final    MODERATE ENTEROCOCCUS FAECALIS MODERATE KLEBSIELLA OXYTOCA ABUNDANT DIPHTHEROIDS(CORYNEBACTERIUM SPECIES) Standardized susceptibility testing for this organism is not available.  Report Status 10/20/2020 FINAL  Final   Organism ID, Bacteria ENTEROCOCCUS FAECALIS  Final   Organism ID, Bacteria KLEBSIELLA OXYTOCA  Final      Susceptibility   Enterococcus faecalis - MIC*    AMPICILLIN <=2 SENSITIVE Sensitive     VANCOMYCIN 1 SENSITIVE Sensitive     GENTAMICIN SYNERGY RESISTANT Resistant     * MODERATE ENTEROCOCCUS FAECALIS   Klebsiella oxytoca - MIC*    AMPICILLIN >=32 RESISTANT Resistant     CEFAZOLIN <=4 SENSITIVE Sensitive     CEFEPIME <=0.12 SENSITIVE Sensitive     CEFTAZIDIME <=1 SENSITIVE Sensitive     CEFTRIAXONE <=0.25 SENSITIVE Sensitive     CIPROFLOXACIN <=0.25 SENSITIVE Sensitive     GENTAMICIN <=1 SENSITIVE Sensitive     IMIPENEM <=0.25 SENSITIVE Sensitive     TRIMETH/SULFA <=20 SENSITIVE Sensitive     AMPICILLIN/SULBACTAM 8 SENSITIVE Sensitive     PIP/TAZO <=4 SENSITIVE Sensitive     * MODERATE KLEBSIELLA OXYTOCA  Aerobic/Anaerobic Culture w Gram Stain (surgical/deep wound)     Status: None (Preliminary result)   Collection Time: 10/21/20  3:07 PM   Specimen: PATH Digit amputation; Tissue  Result Value Ref Range Status   Specimen Description   Final    FOOT  RIGHT 5TH DIGIT AMPUTATION Performed at [facility], [address], [place], [place] [address]    Special Requests   Final    NONE Performed at [facility], [place], [place] [address]    Gram Stain   Final    FEW SQUAMOUS EPITHELIAL CELLS PRESENT FEW WBC SEEN FEW GRAM POSITIVE COCCI Performed at [facility], [place] [address] [place], [place] [address]    Culture PENDING  Incomplete   Report Status PENDING  Incomplete          Radiology Studies: DG Chest Port 1 View  Result Date: 10/22/2020 CLINICAL DATA:  Dyspnea, cough. EXAM: PORTABLE CHEST 1 VIEW COMPARISON:  October 15, 2020. FINDINGS: The heart size and mediastinal contours are within normal limits. No pneumothorax is noted. Mild left lingular subsegmental atelectasis is noted. Right middle lobe opacity is noted concerning for atelectasis or pneumonia and possible right pleural effusion. The visualized skeletal structures are unremarkable. IMPRESSION: Right middle lobe opacity is noted concerning for atelectasis or pneumonia and possible right pleural effusion. Electronically Signed   By: Marijo Conception M.D.   On: 10/22/2020 11:33   DG Foot Complete Right  Result Date: 10/21/2020 CLINICAL DATA:  Status post fifth metatarsal amputation. EXAM: RIGHT FOOT COMPLETE - 3+ VIEW COMPARISON:  None. FINDINGS: The patient is status post amputation of the distal fifth metatarsal and the fifth toe. Overlying dressings limit evaluation of fine cortical detail in these regions. No other acute abnormalities. Vascular calcifications are identified. IMPRESSION: Patient is status post amputation of the distal fifth metatarsal and the fifth toe. Electronically Signed   By: Dorise Bullion III M.D.   On: 10/21/2020 17:00        Scheduled Meds:  acetic acid-hydrocortisone  3 drop Both EARS Q6H   aspirin EC  81 mg Oral Daily   atorvastatin  80 mg Oral q1800   Chlorhexidine Gluconate Cloth  6 each Topical Daily   cholecalciferol  4,000 Units Oral Weekly   cinacalcet  60 mg Oral Daily   docusate sodium  100 mg Oral Daily   feeding supplement (NEPRO CARB STEADY)  237 mL Oral BID BM   ferric citrate  210  mg Oral TID WC   heparin  5,000 Units Subcutaneous Q8H   insulin aspart  0-5 Units Subcutaneous QHS   insulin aspart  0-9 Units Subcutaneous TID WC   insulin aspart  2 Units Subcutaneous TID WC   insulin glargine-yfgn  36 Units Subcutaneous QHS   linaclotide  290 mcg  Oral q morning   mometasone-formoterol  2 puff Inhalation BID   pantoprazole  40 mg Oral Daily   sodium chloride flush  3 mL Intravenous Q12H   Continuous Infusions:  sodium chloride Stopped (10/22/20 1200)   ampicillin-sulbactam (UNASYN) IV 3 g (10/22/20 2327)   diphenhydrAMINE       LOS: 8 days    Time spent: 35 mins     Wyvonnia Dusky, MD [facility] Pager [phone]  If 7PM-7AM, please contact night-coverage 10/23/2020, 7:45 AM

## 2020-10-23 NOTE — Progress Notes (Signed)
Pharmacy Antibiotic Note  KEMISHA BONNETTE is a 50 y.o. female admitted on 10/15/2020 with  diabetic foot wound .  Pharmacy has been consulted for piperacillin/tazobactam dosing. Patient with diabetic foot wound s/p right 5th toes amputation and I&D of foot with 7ml purulence per OR report.  ID wishes to escalate amp/sulb to pip/tazo w/ 2nd area of foot being infected (extension of infection from toe)  Plan: For dialysis, piperacillin/tazobactam 2.25gm IV q8h Pharmacy will follow at distance since no dose adjustment necessary on HD  Height: 5' 4.5" (163.8 cm) Weight: 126.1 kg (278 lb) IBW/kg (Calculated) : 55.85  Temp (24hrs), Avg:98.1 F (36.7 C), Min:97.6 F (36.4 C), Max:98.6 F (37 C)  Recent Labs  Lab 10/19/20 0526 10/20/20 0757 10/21/20 0655 10/22/20 0523 10/23/20 0456  WBC 22.9* 27.7* 31.1* 31.3* 30.7*  CREATININE 8.86* 6.99* 8.04* 6.01* 7.09*    Estimated Creatinine Clearance: 12.7 mL/min (A) (by C-G formula based on SCr of 7.09 mg/dL (H)).    Allergies  Allergen Reactions   Dust Mite Extract Shortness Of Breath and Itching   Mold Extract [Trichophyton] Shortness Of Breath and Itching   Pollen Extract Shortness Of Breath    Itching, shortness of breath, asthma like symptoms   Sulfa Antibiotics Itching   Feraheme [Ferumoxytol] Itching    Uses benadryl so she can get the medicine    Antimicrobials this admission: Ceftriaxone (8/21 >> 8/22) Vancomycin (8/21 >> 8/25) Cefepime (8/22 >>8/26 Amp/sulb 8/26 >>8/29 Pip/tazo 8/29 >>  Dose adjustments this admission:   Microbiology results: 8/27 R 5th digit: S. Anginosis 8/23 Wound: K oxytoca (R to amp), E faecalis (amp-susc)  Thank you for allowing pharmacy to be a part of this patient's care.  Doreene Eland, PharmD, BCPS.   Work Cell: [phone] 10/23/2020 3:34 PM

## 2020-10-23 NOTE — Progress Notes (Signed)
PT Cancellation Note  Patient Details Name: [name] MRN: [id number] DOB: Dec 29, 1970   Cancelled Treatment:    Reason Eval/Treat Not Completed: Other (comment): Pt s/p surgical intervention 10/21/20. Per protocol awaiting new or continue at transfer PT/OT orders prior to patient participating with rehab services, MD aware.  Will attempt to see pt at a future date/time as medically appropriate.    Linus Salmons PT, DPT 10/23/20, 4:00 PM

## 2020-10-23 NOTE — Progress Notes (Signed)
ID S/p 5 th ray amputation on 10/21/20 Foot abscess was also drained Says she was groggy this monring but better now Also was having trouble hearing this morning which has resolved now  Patient Vitals for the past 24 hrs:  BP Temp Temp src Pulse Resp SpO2  10/23/20 0811 (!) 83/60 98.1 F (36.7 C) -- 78 15 99 %  10/23/20 0714 -- -- -- -- -- 99 %  10/23/20 0519 (!) 140/130 97.6 F (36.4 C) -- 86 18 93 %  10/23/20 0013 98/60 98.6 F (37 C) Oral 85 -- 100 %  10/22/20 1826 103/60 -- -- -- -- --  10/22/20 1819 94/69 97.9 F (36.6 C) Oral 90 18 97 %  10/22/20 1515 (!) 102/55 98.8 F (37.1 C) Oral 92 20 98 %  10/22/20 1452 94/63 98.4 F (36.9 C) Oral 91 20 98 %   Awake and alert No distress Chest b/l air entry [id number] Rt foot Podiatrist removed the dressing     Labs CBC Latest Ref Rng & Units 10/23/2020 10/22/2020 10/22/2020  WBC 4.0 - 10.5 K/uL 30.7(H) - 31.3(H)  Hemoglobin 12.0 - 15.0 g/dL 8.2(L) 8.3(L) 6.8(L)  Hematocrit 36.0 - 46.0 % 24.7(L) 24.7(L) 21.0(L)  Platelets 150 - 400 K/uL 400 - 376    CMP Latest Ref Rng & Units 10/23/2020 10/22/2020 10/21/2020  Glucose 70 - 99 mg/dL 131(H) 215(H) 126(H)  BUN 6 - 20 mg/dL 53(H) 41(H) 57(H)  Creatinine 0.44 - 1.00 mg/dL 7.09(H) 6.01(H) 8.04(H)  Sodium 135 - 145 mmol/L 126(L) 128(L) 126(L)  Potassium 3.5 - 5.1 mmol/L 4.5 4.8 5.0  Chloride 98 - 111 mmol/L 88(L) 87(L) 87(L)  CO2 22 - 32 mmol/L 23 26 23   Calcium 8.9 - 10.3 mg/dL 7.3(L) 7.7(L) 7.8(L)  Total Protein 6.5 - 8.1 g/dL 6.8 6.7 -  Total Bilirubin 0.3 - 1.2 mg/dL 1.1 1.0 -  Alkaline Phos 38 - 126 U/L 474(H) 477(H) -  AST 15 - 41 U/L 88(H) 60(H) -  ALT 0 - 44 U/L 31 22 -     Micro Bone culture pending  Impression/recommendation-  Diabetic foot infection with necrosis Partial 5th ray amputation. Also had a foot abscess, which has been drained- this is a complex absces soccupying deep spaces. May need to go back to surgery Until bone cultures are back will do zosyn   ESRD  on dialysis  DM On insulin  Pt seen with Dr.Patel- foot and ankle specialist

## 2020-10-23 NOTE — Evaluation (Addendum)
Clinical/Bedside Swallow Evaluation Patient Details  Name: [name] MRN: [id number] Date of Birth: Oct 18, 1970  Today's Date: 10/23/2020 Time: SLP Start Time (ACUTE ONLY): 35 SLP Stop Time (ACUTE ONLY): 1315 SLP Time Calculation (min) (ACUTE ONLY): 55 min  Past Medical History:  Past Medical History:  Diagnosis Date   (HFpEF) heart failure with preserved ejection fraction ([place])    a. 11/2014 Echo: EF 50-55%, Gr1 DD; b. 06/2016 Echo: EF 60-65%, no rwma, mod dil LA, nl RV, triv eff.   Anxiety    Asthma    Bronchitis    CHF (congestive heart failure) (HCC)    Chronic cough    Chronic respiratory failure with hypoxia and hypercapnia ([place])    a. Home O2.  (2-4L)   CKD (chronic kidney disease), stage III (HCC)    Depression    Diabetes mellitus without complication (HCC)    Diverticulosis    Dyspnea    Dysrhythmia    per pt, no arrythmias   Environmental and seasonal allergies    Extreme obesity with alveolar hypoventilation (HCC)    Hypertension    Iron deficiency anemia    a. chronic blood loss->heavy menses.   Kidney failure    dialysis tues/thur/sat   ... left arm shunt   Orthopnea    Oxygen dependent    3l/min   Pericardial effusion    a. 11/2014 CT Chest: Mod effusion; b. 11/2014 Echo: small to mod circumferential effusion. No hemodynamic compromise.   Pneumonia    history   Poorly controlled diabetes mellitus ([place])    a. 11/2014 A1c 13.2.   Swelling of both lower extremities    Past Surgical History:  Past Surgical History:  Procedure Laterality Date   A/V FISTULAGRAM Left 06/09/2017   Procedure: A/V FISTULAGRAM;  Surgeon: Algernon Huxley, MD;  Location: [place] [facility];  Service: Cardiovascular;  Laterality: Left;   A/V FISTULAGRAM Left 06/16/2017   Procedure: A/V FISTULAGRAM;  Surgeon: Algernon Huxley, MD;  Location: [place] [facility];  Service: Cardiovascular;  Laterality: Left;   A/V FISTULAGRAM Left 01/28/2018   Procedure: A/V FISTULAGRAM;  Surgeon: Algernon Huxley, MD;  Location: [place] [facility];  Service: Cardiovascular;  Laterality: Left;   AMPUTATION TOE Right 10/21/2020   Procedure: AMPUTATION TOE-5th Digit Amputation Right Foot;  Surgeon: Edrick Kins, DPM;  Location: [facility];  Service: Podiatry;  Laterality: Right;   AV FISTULA PLACEMENT Left 04/25/2017   Procedure: ARTERIOVENOUS (AV) FISTULA CREATION;  Surgeon: Algernon Huxley, MD;  Location: [facility];  Service: Vascular;  Laterality: Left;   CATARACT EXTRACTION W/PHACO Left 01/21/2018   Procedure: CATARACT EXTRACTION PHACO AND INTRAOCULAR LENS PLACEMENT (IOC);  Surgeon: Leandrew Koyanagi, MD;  Location: [facility];  Service: Ophthalmology;  Laterality: Left;  [place] 01:55.5 CDE 9.05 EAUP 13.9 Fluid Pack Lot # [id number]   CESAREAN SECTION     times 3   DIALYSIS/PERMA CATHETER INSERTION Right 04/04/2017   Procedure: DIALYSIS/PERMA CATHETER INSERTION;  Surgeon: Katha Cabal, MD;  Location: [place] [facility];  Service: Cardiovascular;  Laterality: Right;   DIALYSIS/PERMA CATHETER REMOVAL N/A 09/17/2017   Procedure: DIALYSIS/PERMA CATHETER REMOVAL;  Surgeon: Algernon Huxley, MD;  Location: [place] [facility];  Service: Cardiovascular;  Laterality: N/A;   INSERTION OF DIALYSIS CATHETER N/A 04/16/2017   Procedure: INSERTION OF DIALYSIS PERM CATHETER;  Surgeon: Algernon Huxley, MD;  Location: [facility];  Service: Vascular;  Laterality: N/A;   LOWER EXTREMITY ANGIOGRAPHY Right 10/18/2020  Procedure: Lower Extremity Angiography;  Surgeon: Algernon Huxley, MD;  Location: [place] [facility];  Service: Cardiovascular;  Laterality: Right;   HPI:  Pt is a 50 y.o. female seen in ed with complaints of generalized malaise. Pt noted that her toe has been black and malodorous.Pt is being followed at wound care for her Diabetic foot wound and per patient it has been draining.   Pt has right foot diabetic foot wound since July and ulcer since 4 days. At admit, she stated at a party she was dizzy, not feeling and  her bp at dialysis was 88 and took midodrine and got her HD treatment was completed.  Heart was beating was fast, back and legs were hurting bad, ulcer was draining, and was tender, and cant walk on it. Pt has wound care appt tomorrow. Bgt are usually fair and 250's-70 sometimes.  Pt reports chills x 2 days and fever today that she did not know about. Pt also reports Vomiting x 1 month and xray of stomach by pcp and was told she was Constipated.  PMH includes: Obesity -- Extreme obesity with alveolar hypoventilation, Asthma, ESRD on HD, DM II poorly controlled, DFU, HTN, Anemia of CKD, COPD on Home O2 chronic. No Neurological dxs per H&P.   Assessment / Plan / Recommendation Clinical Impression  Pt seen for BSE today -- she has No Neurological dx or h/o CVA per H&P though does have Pulmonary dxs including COPD on Chronic Home O2, Asthma. She also has dx of extreme Obesity with Alveolar Hypoventilation. Pt eats a Regular diet at home per her report. W/in the recent month, she reported Vomiting w/ Constipation dx'd per PCP. ANY Pulmonary and/or GI Regurgitation can increase risk for airway compromise during the swallow and/or aspiration of REFLUX material after the swallow d/t Regurgitation.    During this study today, pt appears to present w/ adequate oropharyngeal phase swallow w/ No oropharyngeal phase dysphagia noted, No neuromuscular deficits noted. Pt consumed po trials w/ No immediate, overt clinical s/s of aspiration during po trials. Pt appears at reduced risk for aspiration following general aspiration precautions including Behavioral choices of Not Talking w/ food/drink in mouth. Pt also c/o potential oral dryness and eating dry foods -- discussed the impact of dry mouth when eating/drinking w/ chronic O2 use and the side effect of dry mouth from multiple medications; strategies to moisten mouth b/f meals and in b/t meals.    During po trials, pt consumed all consistencies w/ no overt coughing,  decline in vocal quality, or change in respiratory presentation during/post trials. Pt did exhibit a mild throat clearing x1 post 2 sips; throat clearing did not increase in frequencey w/ further po trials. Oral phase appeared [facility] w/ timely bolus management, mastication, and control of bolus propulsion for A-P transfer for swallowing. Oral clearing achieved w/ all trial consistencies. OM Exam appeared [facility] w/ no unilateral weakness noted. Speech Clear. Pt fed self.   Recommend a Regular consistency diet w/ well-Cut meats, moistened foods; Thin liquids VIA CUP for less air swallowing and increased Small sip control. Recommend general aspiration precautions, Pills WHOLE in Puree for safer, easier swallowing as pt described Larger pills causing difficulty to swallow. Encouraged pt and Family to NOT Talk during eating/drinking -- Reduce Distractions. Education given on Pills in Puree; food consistencies and easy to eat options; general aspiration and Reflux precautions. NSG to reconsult if any new needs arise. NSG agreed. MD updated Addendum: unsure if pt has any Baseline  Reflux/GERD; she is currently on a PPI per chart. Recommend monitoring for such and f/u w/ GI for education and management if indicated to help lessen any risk for Retrograde backflow of Reflux material possibly impacting airway, Pulmonary health.  SLP Visit Diagnosis: Dysphagia, unspecified (R13.10)    Aspiration Risk   (reduced w/ general precautions)    Diet Recommendation   Regular consistency diet w/ well-Cut meats, moistened foods; Thin liquids VIA CUP for less air swallowing and increased Small sip control. Recommend general aspiration precautions, encouraged pt and Family to NOT Talk during eating/drinking -- Reduce Distractions during meals.  Medication Administration: Whole meds with puree    Other  Recommendations Recommended Consults:  (Dietician f/u) Oral Care Recommendations: Oral care BID;Patient independent with oral  care;Oral care before and after PO Other Recommendations:  (n/a)   Follow up Recommendations None      Frequency and Duration  (n/a)   (n/a)       Prognosis Prognosis for Safe Diet Advancement: Good Barriers to Reach Goals:  (Pulmonary - chronic)      Swallow Study   General Date of Onset: 10/15/20 HPI: Pt is a 50 y.o. female seen in ed with complaints of generalized malaise. Pt noted that her toe has been black and malodorous.Pt is being followed at wound care for her Diabetic foot wound and per patient it has been draining.   Pt has right foot diabetic foot wound since July and ulcer since 4 days. At admit, she stated at a party she was dizzy, not feeling and her bp at dialysis was 88 and took midodrine and got her HD treatment was completed.  Heart was beating was fast, back and legs were hurting bad, ulcer was draining, and was tender, and cant walk on it. Pt has wound care appt tomorrow. Bgt are usually fair and 250's-70 sometimes.  Pt reports chills x 2 days and fever today that she did not know about. Pt also reports Vomiting x 1 month and xray of stomach by pcp and was told she was Constipated.  PMH includes: Obesity -- Extreme obesity with alveolar hypoventilation, Asthma, ESRD on HD, DM II poorly controlled, DFU, HTN, Anemia of CKD, COPD on Home O2 chronic. No Neurological dxs per H&P. Type of Study: Bedside Swallow Evaluation Previous Swallow Assessment: none Diet Prior to this Study: NPO (but on Regular diet b/f yesterday, at home also) Temperature Spikes Noted: No (wbc 30.7) Respiratory Status: Nasal cannula (4L chronic at home) History of Recent Intubation: No Behavior/Cognition: Alert;Cooperative;Pleasant mood Oral Cavity Assessment: Within Functional Limits Oral Care Completed by SLP: Recent completion by staff Oral Cavity - Dentition: Adequate natural dentition Vision: Functional for self-feeding Self-Feeding Abilities: Able to feed self Patient Positioning: Upright in  bed Baseline Vocal Quality: Normal Volitional Cough: Strong Volitional Swallow: Able to elicit    Oral/Motor/Sensory Function Overall Oral Motor/Sensory Function: Within functional limits   Ice Chips Ice chips: Within functional limits Presentation: Spoon (fed; 2 trials)   Thin Liquid Thin Liquid: Within functional limits Presentation: Cup;Self Fed (10 trials)    Nectar Thick Nectar Thick Liquid: Not tested   Honey Thick Honey Thick Liquid: Not tested   Puree Puree: Within functional limits Presentation: Self Fed;Spoon (~3 ozs total)   Solid     Solid: Within functional limits Presentation: Self Fed;Spoon (5 trials)        Orinda Kenner, MS, CCC-SLP Speech Language Pathologist Rehab Services [phone] Nikolaos Maddocks 10/23/2020,3:47 PM

## 2020-10-23 NOTE — Progress Notes (Signed)
OT Cancellation Note  Patient Details Name: [name] MRN: [id number] DOB: 04/25/1970   Cancelled Treatment:    Reason Eval/Treat Not Completed: Other (comment)Pt s/p surgical intervention 10/21/20. Per protocol awaiting new or continue at transfer PT/OT orders prior to patient participating with rehab services, MD aware.  Will attempt to see pt at a future date/time as medically appropriate.   Darleen Crocker, Ken Caryl, OTR/L , CBIS ascom [phone]  10/23/20, 4:03 PM

## 2020-10-23 NOTE — Progress Notes (Addendum)
   PODIATRY PROGRESS NOTE  NAME [name] MRN [id number] DOB 1970/12/21 DOA 10/15/2020   Subjetive: Pt s/p RT partial 5th ray amputation with I&D. DOS: 10/21/2020. Resting comfortably in bed with husband present.  Patient states that she does not have any pain associated to the foot. Minimal bleeding noted. Denies N/V/SOB/CP/chills  Past Medical History:  Diagnosis Date   (HFpEF) heart failure with preserved ejection fraction ([place])    a. 11/2014 Echo: EF 50-55%, Gr1 DD; b. 06/2016 Echo: EF 60-65%, no rwma, mod dil LA, nl RV, triv eff.   Anxiety    Asthma    Bronchitis    CHF (congestive heart failure) (HCC)    Chronic cough    Chronic respiratory failure with hypoxia and hypercapnia ([place])    a. Home O2.  (2-4L)   CKD (chronic kidney disease), stage III (HCC)    Depression    Diabetes mellitus without complication (HCC)    Diverticulosis    Dyspnea    Dysrhythmia    per pt, no arrythmias   Environmental and seasonal allergies    Extreme obesity with alveolar hypoventilation (HCC)    Hypertension    Iron deficiency anemia    a. chronic blood loss->heavy menses.   Kidney failure    dialysis tues/thur/sat   ... left arm shunt   Orthopnea    Oxygen dependent    3l/min   Pericardial effusion    a. 11/2014 CT Chest: Mod effusion; b. 11/2014 Echo: small to mod circumferential effusion. No hemodynamic compromise.   Pneumonia    history   Poorly controlled diabetes mellitus ([place])    a. 11/2014 A1c 13.2.   Swelling of both lower extremities     CBC Latest Ref Rng & Units 10/23/2020 10/22/2020 10/22/2020  WBC 4.0 - 10.5 K/uL 30.7(H) - 31.3(H)  Hemoglobin 12.0 - 15.0 g/dL 8.2(L) 8.3(L) 6.8(L)  Hematocrit 36.0 - 46.0 % 24.7(L) 24.7(L) 21.0(L)  Platelets 150 - 400 K/uL 400 - 376    BMP Latest Ref Rng & Units 10/23/2020 10/22/2020 10/21/2020  Glucose 70 - 99 mg/dL 131(H) 215(H) 126(H)  BUN 6 - 20 mg/dL 53(H) 41(H) 57(H)  Creatinine 0.44 - 1.00 mg/dL 7.09(H) 6.01(H) 8.04(H)   Sodium 135 - 145 mmol/L 126(L) 128(L) 126(L)  Potassium 3.5 - 5.1 mmol/L 4.5 4.8 5.0  Chloride 98 - 111 mmol/L 88(L) 87(L) 87(L)  CO2 22 - 32 mmol/L 23 26 23   Calcium 8.9 - 10.3 mg/dL 7.3(L) 7.7(L) 7.8(L)           ASSESSMENT/PLAN OF CARE S/P RT fifth toe amputation with I&D.  DOS: 10/21/2020 Critical limb ischemia RT -Foot remains very cold to touch.  Continued Minimal bleeding noted upon removal of the packing from the I&D sites.  High concern for ischemia which may lead to more proximal amputation. Patient may have microvessel disease. -Dressings changed.  Packing reapplied -Continue IV antibiotics as per ID  -Had a another conversation with both the patient and the husband about the severity of her foot condition with the limited perfusion and infection which resulted in high risk of limb loss.  Patient and husband justifiably saddened but understanding.  We will continue daily packing with the possibility of returning to the OR for repeat irrigation and debridement.

## 2020-10-24 DIAGNOSIS — E11628 Type 2 diabetes mellitus with other skin complications: Secondary | ICD-10-CM | POA: Diagnosis not present

## 2020-10-24 DIAGNOSIS — J690 Pneumonitis due to inhalation of food and vomit: Secondary | ICD-10-CM | POA: Diagnosis not present

## 2020-10-24 DIAGNOSIS — D62 Acute posthemorrhagic anemia: Secondary | ICD-10-CM | POA: Diagnosis not present

## 2020-10-24 DIAGNOSIS — L089 Local infection of the skin and subcutaneous tissue, unspecified: Secondary | ICD-10-CM | POA: Diagnosis not present

## 2020-10-24 DIAGNOSIS — L97519 Non-pressure chronic ulcer of other part of right foot with unspecified severity: Secondary | ICD-10-CM | POA: Diagnosis not present

## 2020-10-24 DIAGNOSIS — E11621 Type 2 diabetes mellitus with foot ulcer: Secondary | ICD-10-CM | POA: Diagnosis not present

## 2020-10-24 DIAGNOSIS — I96 Gangrene, not elsewhere classified: Secondary | ICD-10-CM | POA: Diagnosis not present

## 2020-10-24 LAB — BPAM RBC
Blood Product Expiration Date: 202209032359
ISSUE DATE / TIME: 202208281448
Unit Type and Rh: 600

## 2020-10-24 LAB — GLUCOSE, CAPILLARY
Glucose-Capillary: 197 mg/dL — ABNORMAL HIGH (ref 70–99)
Glucose-Capillary: 178 mg/dL — ABNORMAL HIGH (ref 70–99)
Glucose-Capillary: 305 mg/dL — ABNORMAL HIGH (ref 70–99)
Glucose-Capillary: 222 mg/dL — ABNORMAL HIGH (ref 70–99)

## 2020-10-24 LAB — COMPREHENSIVE METABOLIC PANEL
ALT: 91 U/L — ABNORMAL HIGH (ref 0–44)
AST: 346 U/L — ABNORMAL HIGH (ref 15–41)
Albumin: 2.5 g/dL — ABNORMAL LOW (ref 3.5–5.0)
Alkaline Phosphatase: 661 U/L — ABNORMAL HIGH (ref 38–126)
Anion gap: 20 — ABNORMAL HIGH (ref 5–15)
BUN: 68 mg/dL — ABNORMAL HIGH (ref 6–20)
CO2: 18 mmol/L — ABNORMAL LOW (ref 22–32)
Calcium: 7 mg/dL — ABNORMAL LOW (ref 8.9–10.3)
Chloride: 85 mmol/L — ABNORMAL LOW (ref 98–111)
Creatinine, Ser: 8.43 mg/dL — ABNORMAL HIGH (ref 0.44–1.00)
GFR, Estimated: 5 mL/min — ABNORMAL LOW (ref >60)
Glucose, Bld: 198 mg/dL — ABNORMAL HIGH (ref 70–99)
Potassium: 5.6 mmol/L — ABNORMAL HIGH (ref 3.5–5.1)
Sodium: 123 mmol/L — ABNORMAL LOW (ref 135–145)
Total Bilirubin: 1.2 mg/dL (ref 0.3–1.2)
Total Protein: 7 g/dL (ref 6.5–8.1)

## 2020-10-24 LAB — CBC
HCT: 24.2 % — ABNORMAL LOW (ref 36.0–46.0)
Hemoglobin: 8.1 g/dL — ABNORMAL LOW (ref 12.0–15.0)
MCH: 31.8 pg (ref 26.0–34.0)
MCHC: 33.5 g/dL (ref 30.0–36.0)
MCV: 94.9 fL (ref 80.0–100.0)
Platelets: 398 10*3/uL (ref 150–400)
RBC: 2.55 MIL/uL — ABNORMAL LOW (ref 3.87–5.11)
RDW: 14.6 % (ref 11.5–15.5)
WBC: 28.2 10*3/uL — ABNORMAL HIGH (ref 4.0–10.5)
nRBC: 0.2 % (ref 0.0–0.2)

## 2020-10-24 LAB — TYPE AND SCREEN
ABO/RH(D): A POS
Antibody Screen: NEGATIVE
Unit division: 0

## 2020-10-24 LAB — SURGICAL PATHOLOGY

## 2020-10-24 MED ORDER — ALBUTEROL SULFATE (2.5 MG/3ML) 0.083% IN NEBU
INHALATION_SOLUTION | RESPIRATORY_TRACT | Status: AC
  Administered 2020-10-24: 2.5 mg via RESPIRATORY_TRACT
  Filled 2020-10-24: qty 3

## 2020-10-24 MED ORDER — IPRATROPIUM-ALBUTEROL 0.5-2.5 (3) MG/3ML IN SOLN
3.0000 mL | Freq: Three times a day (TID) | RESPIRATORY_TRACT | Status: DC
  Administered 2020-10-24 – 2020-11-10 (×46): 3 mL via RESPIRATORY_TRACT
  Filled 2020-10-24 (×47): qty 3

## 2020-10-24 NOTE — Progress Notes (Signed)
Patient Treatment initiated, T/B diminished when assessing AVF. Complications sticking Venous needle, but re-stick was not required was able to push and pull with adjustment. Patient will attempt to pull 2kg, patient was provided breakfast.

## 2020-10-24 NOTE — TOC Progression Note (Signed)
Transition of Care [facility]) - Progression Note    Patient Details  Name: [name] MRN: [id number] Date of Birth: 07-30-1970  [facility] CM/SW [place], RN Phone Number: 10/24/2020, 3:16 PM  Clinical Narrative:    The patient will possibly require additional surgery, SNF placement on hold for now, will be evaluated by PT then CM will set up for DC, will continue to monitor   Expected Discharge Plan: Home/Self Care Barriers to Discharge: Continued Medical Work up  Expected Discharge Plan and Services Expected Discharge Plan: Home/Self Care   Discharge Planning Services: CM Consult   Living arrangements for the past 2 months: Single Family Home                 DME Arranged: Walker rolling DME Agency: [employer] Date DME Agency Contacted: 10/19/20 Time DME Agency Contacted: [phone] Representative spoke with at DME Agency: [place] ([place]) Interventions    Readmission Risk Interventions No flowsheet data found.

## 2020-10-24 NOTE — Progress Notes (Signed)
Hemodialysis patient known at [place] 6:00am. Patient stated that she rides with [employer] and has no transportation or dialysis concerns. Please contact me with any dialysis placement concerns.  Elvera Bicker Dialysis Coordinator [phone]

## 2020-10-24 NOTE — Progress Notes (Addendum)
PROGRESS NOTE    HPI was taken from Dr. Girard Cooter: [name] is a 50 y.o. female seen in ed with complaints of generalized malaise.  Pt noted that her toe has been black and malodorous.Pt is being followed at wound care for her Diabetic foot wound and per patient it has been draining.  Pt has right foot diabetic foot wound since July and ulcer since 4 days.   Today at party was dizzy, not feeling and her bp at dialysis was 88 and took midodrine and got her HD treatment was completed.    Heart was beating was fast, back and legs were hurting bad, ulcer was draining, and was tender, and cant walk on it. Pt has wound care appt tomorrow. Bgt are usually fair and 250's-70 sometimes.    Pt reports chills x 2 days and fever today that she did not know about.  Pt also reports vomiting x 1 month and xray of stomach by pcp and was told she was constipated.    Pt has past medical history of ESRD on HD, DM II,DFU, HTN, Anemia of CKD, COPD.   Hospital course from Dr. Jimmye Norman 8/24-8/30/22: Pt was found to diabetic foot ulcer w/ gangrene of 5th toe & osteomyelitis. Wound cx growing enterococcus faecalis, klebsiella. Pt is s/p partial 5th ray amputation & I&D right foot 8/27 as per podiatry. Continue on IV zosyn as per ID. Of note,  ABI shows moderate occlusive disease bilateral lower extremity. S/p angiogram 8/24 which showed normal common femoral artery, profunda femoris artery, superficial femoral artery & popliteal artery had mild lesion of 30% or < just below the knee. Vascular surg was re-consulted today as pt's foot is not healing well and will re-evaluate the pt tomorrow. Please see podiatry notes.    [name]  QMG:[id number] DOB: Feb 05, 1971 DOA: 10/15/2020 PCP: [name], NP   Assessment & Plan:   Principal Problem:   Diabetic foot ulcer ([place]) Active Problems:   Diabetes ([place] [place])   Essential hypertension   DNR (do not resuscitate) discussion   End-stage renal disease on  hemodialysis ([place])   Chronic respiratory failure with hypoxia and hypercapnia ([facility])   Decreased pedal pulses   Diabetic foot ulcer: w/ gangrene of 5th toe & osteomyelitis. Continue on IV zosyn as per ID. Wound cx growing enterococcus faecalis, klebsiella. Oxycodone prn for pain. S/p partial 5th ray amputation & I&D right foot 8/27 as per podiatry. Wounds are not healing well, vascular surg re-consulted   PVD: has decreased pedal pulses. ABI shows moderate occlusive disease bilateral lower extremity. S/p angiogram 8/24 which showed normal common femoral artery, profunda femoris artery, superficial femoral artery & popliteal artery had mild lesion of 30% or < just below the knee. Vascular surg re-consulted as above wounds are not healing well   Aspiration pneumonia: intermittently coughing & choking on foods & liquids. Speech evaluated pt and educated pt on aspiration precautions, swallowing safely (see speech's note). Continue on IV zosyn   Decreased hearing: etiology unclear. Continue on vosol-hc solution. Continues to improve    Leukocytosis: continues to trend down. Secondary to above infections   DM2: HbA1c 8.9, poorly controlled. Continue on glargine, SSI w/ accuchecks   ESRD: on HD. Management per nephro   ACD: likely secondary to ACD w/ component of acute blood loss anemia secondary to recent surgery as stated above. S/p 1 unit of pRBCs given 8/28. H&H are labile.   Hyponatremia: will be managed w/ HD as per nephro  Chronic hypoxia: continue on supplemental oxygen, 3L [place]  Morbid obesity: BMI 46.9. Complicates overall care & prognosis        DVT prophylaxis: heparin  Code Status: full  Family Communication:  Disposition Plan: unclear, likely d/c back home   Level of care: Med-Surg  Status is: Inpatient  Remains inpatient appropriate because:Ongoing diagnostic testing needed not appropriate for outpatient work up, IV treatments appropriate due to intensity of illness or  inability to take PO, and Inpatient level of care appropriate due to severity of illness  Dispo: The patient is from: Home              Anticipated d/c is to: Home              Patient currently is not medically stable to d/c.   Difficult to place patient : unclear     Consultants:  Vascular surg ID  Procedures:   Antimicrobials: unasyn   Subjective: Pt c/o being cold  Objective: Vitals:   10/23/20 1709 10/23/20 2003 10/24/20 0538 10/24/20 0809  BP: 108/60 105/72 125/70 (!) 110/59  Pulse: 87 87 85 85  Resp: 16 18 18 20   Temp: 97.6 F (36.4 C) 97.9 F (36.6 C) 97.8 F (36.6 C) 97.6 F (36.4 C)  TempSrc:      SpO2: 99% 98% 100% 100%  Weight:      Height:       No intake or output data in the 24 hours ending 10/24/20 0815   Filed Weights   10/15/20 1905 10/18/20 0928  Weight: 126.1 kg 126.1 kg    Examination:  General exam: Appears lethargic. Morbidly obese Respiratory system: diminished breath sounds b/l  Cardiovascular system: S1 & S2+. No rubs or clicks  Gastrointestinal system: Abd is soft, NT, obese & hypoactive bowel sounds  Central nervous system: Lethargic. Moves all extremiteis  Psychiatry: Judgement and insight appear normal. Flat mood and affect    Data Reviewed: I have personally reviewed following labs and imaging studies  CBC: Recent Labs  Lab 10/20/20 0757 10/21/20 0655 10/22/20 0523 10/22/20 2242 10/23/20 0456 10/24/20 0527  WBC 27.7* 31.1* 31.3*  --  30.7* 28.2*  HGB 8.6* 8.3* 6.8* 8.3* 8.2* 8.1*  HCT 26.6* 25.7* 21.0* 24.7* 24.7* 24.2*  MCV 93.3 92.1 95.5  --  91.8 94.9  PLT 396 399 376  --  400 220   Basic Metabolic Panel: Recent Labs  Lab 10/18/20 0602 10/19/20 0526 10/20/20 0757 10/21/20 0104 10/21/20 0655 10/22/20 0523 10/23/20 0456 10/24/20 0527  NA 130* 126* 126*  --  126* 128* 126* 123*  K 4.2 4.7 4.4  --  5.0 4.8 4.5 5.6*  CL 89* 85* 85*  --  87* 87* 88* 85*  CO2 25 24 25   --  23 26 23  18*  GLUCOSE 180*  325* 239*  --  126* 215* 131* 198*  BUN 48* 61* 45*  --  57* 41* 53* 68*  CREATININE 8.03* 8.86* 6.99*  --  8.04* 6.01* 7.09* 8.43*  CALCIUM 8.7* 7.8* 7.8*  --  7.8* 7.7* 7.3* 7.0*  MG 1.8 1.8 1.9 2.0 2.1  --   --   --   PHOS  --   --  4.5  --   --   --   --   --    GFR: Estimated Creatinine Clearance: 10.7 mL/min (A) (by C-G formula based on SCr of 8.43 mg/dL (H)). Liver Function Tests: Recent Labs  Lab 10/22/20 [phone] 10/23/20 0456  10/24/20 0527  AST 60* 88* 346*  ALT 22 31 91*  ALKPHOS 477* 474* 661*  BILITOT 1.0 1.1 1.2  PROT 6.7 6.8 7.0  ALBUMIN 2.4* 2.4* 2.5*   No results for input(s): LIPASE, AMYLASE in the last 168 hours. No results for input(s): AMMONIA in the last 168 hours. Coagulation Profile: No results for input(s): INR, PROTIME in the last 168 hours.  Cardiac Enzymes: No results for input(s): CKTOTAL, CKMB, CKMBINDEX, TROPONINI in the last 168 hours. BNP (last 3 results) No results for input(s): PROBNP in the last 8760 hours. HbA1C: No results for input(s): HGBA1C in the last 72 hours. CBG: Recent Labs  Lab 10/23/20 0820 10/23/20 1202 10/23/20 1628 10/23/20 2139 10/24/20 0727  GLUCAP 123* 98 207* 192* 197*   Lipid Profile: No results for input(s): CHOL, HDL, LDLCALC, TRIG, CHOLHDL, LDLDIRECT in the last 72 hours. Thyroid Function Tests: No results for input(s): TSH, T4TOTAL, FREET4, T3FREE, THYROIDAB in the last 72 hours. Anemia Panel: No results for input(s): VITAMINB12, FOLATE, FERRITIN, TIBC, IRON, RETICCTPCT in the last 72 hours. Sepsis Labs: No results for input(s): PROCALCITON, LATICACIDVEN in the last 168 hours.   Recent Results (from the past 240 hour(s))  Blood Culture (routine x 2)     Status: None   Collection Time: 10/15/20  8:03 PM   Specimen: BLOOD  Result Value Ref Range Status   Specimen Description BLOOD BLOOD RIGHT FOREARM  Final   Special Requests   Final    BOTTLES DRAWN AEROBIC AND ANAEROBIC Blood Culture adequate volume    Culture   Final    NO GROWTH 5 DAYS Performed at [facility], [address] [place], [place] [address]    Report Status 10/20/2020 FINAL  Final  Resp Panel by RT-PCR (Flu A&B, Covid) Nasopharyngeal Swab     Status: None   Collection Time: 10/15/20  8:26 PM   Specimen: Nasopharyngeal Swab; Nasopharyngeal(NP) swabs in vial transport medium  Result Value Ref Range Status   SARS Coronavirus 2 by RT PCR NEGATIVE NEGATIVE Final    Comment: (NOTE) SARS-CoV-2 target nucleic acids are NOT DETECTED.  The SARS-CoV-2 RNA is generally detectable in upper respiratory specimens during the acute phase of infection. The lowest concentration of SARS-CoV-2 viral copies this assay can detect is 138 copies/mL. A negative result does not preclude SARS-Cov-2 infection and should not be used as the sole basis for treatment or other patient management decisions. A negative result may occur with  improper specimen collection/handling, submission of specimen other than nasopharyngeal swab, presence of viral mutation(s) within the areas targeted by this assay, and inadequate number of viral copies(<138 copies/mL). A negative result must be combined with clinical observations, patient history, and epidemiological information. The expected result is Negative.  Fact Sheet for Patients:  EntrepreneurPulse.com.au  Fact Sheet for Healthcare Providers:  IncredibleEmployment.be  This test is no t yet approved or cleared by the [place] FDA and  has been authorized for detection and/or diagnosis of SARS-CoV-2 by FDA under an Emergency Use Authorization (EUA). This EUA will remain  in effect (meaning this test can be used) for the duration of the COVID-19 declaration under Section 564(b)(1) of the Act, 21 U.S.C.section 360bbb-3(b)(1), unless the authorization is terminated  or revoked sooner.       Influenza A by PCR NEGATIVE NEGATIVE Final   Influenza B by  PCR NEGATIVE NEGATIVE Final    Comment: (NOTE) The Xpert Xpress SARS-CoV-2/FLU/RSV plus assay is intended as an aid in  the diagnosis of influenza from Nasopharyngeal swab specimens and should not be used as a sole basis for treatment. Nasal washings and aspirates are unacceptable for Xpert Xpress SARS-CoV-2/FLU/RSV testing.  Fact Sheet for Patients: EntrepreneurPulse.com.au  Fact Sheet for Healthcare Providers: IncredibleEmployment.be  This test is not yet approved or cleared by the [place] FDA and has been authorized for detection and/or diagnosis of SARS-CoV-2 by FDA under an Emergency Use Authorization (EUA). This EUA will remain in effect (meaning this test can be used) for the duration of the COVID-19 declaration under Section 564(b)(1) of the Act, 21 U.S.C. section 360bbb-3(b)(1), unless the authorization is terminated or revoked.  Performed at [facility], [address] [place], [place] [address]   Blood Culture (routine x 2)     Status: None   Collection Time: 10/15/20  8:26 PM   Specimen: BLOOD  Result Value Ref Range Status   Specimen Description BLOOD RIGHT ANTECUBITAL  Final   Special Requests   Final    BOTTLES DRAWN AEROBIC AND ANAEROBIC Blood Culture adequate volume   Culture   Final    NO GROWTH 5 DAYS Performed at [facility], [address] [place], [place] [address]    Report Status 10/20/2020 FINAL  Final  Aerobic Culture w Gram Stain (superficial specimen)     Status: None   Collection Time: 10/17/20  8:52 PM   Specimen: Wound  Result Value Ref Range Status   Specimen Description WOUND  Final   Special Requests NONE  Final   Gram Stain   Final    RARE SQUAMOUS EPITHELIAL CELLS PRESENT FEW WBC PRESENT, PREDOMINANTLY MONONUCLEAR ABUNDANT GRAM VARIABLE ROD FEW GRAM POSITIVE COCCI    Culture   Final    MODERATE ENTEROCOCCUS FAECALIS MODERATE KLEBSIELLA OXYTOCA ABUNDANT  DIPHTHEROIDS(CORYNEBACTERIUM SPECIES) Standardized susceptibility testing for this organism is not available.    Report Status 10/20/2020 FINAL  Final   Organism ID, Bacteria ENTEROCOCCUS FAECALIS  Final   Organism ID, Bacteria KLEBSIELLA OXYTOCA  Final      Susceptibility   Enterococcus faecalis - MIC*    AMPICILLIN <=2 SENSITIVE Sensitive     VANCOMYCIN 1 SENSITIVE Sensitive     GENTAMICIN SYNERGY RESISTANT Resistant     * MODERATE ENTEROCOCCUS FAECALIS   Klebsiella oxytoca - MIC*    AMPICILLIN >=32 RESISTANT Resistant     CEFAZOLIN <=4 SENSITIVE Sensitive     CEFEPIME <=0.12 SENSITIVE Sensitive     CEFTAZIDIME <=1 SENSITIVE Sensitive     CEFTRIAXONE <=0.25 SENSITIVE Sensitive     CIPROFLOXACIN <=0.25 SENSITIVE Sensitive     GENTAMICIN <=1 SENSITIVE Sensitive     IMIPENEM <=0.25 SENSITIVE Sensitive     TRIMETH/SULFA <=20 SENSITIVE Sensitive     AMPICILLIN/SULBACTAM 8 SENSITIVE Sensitive     PIP/TAZO <=4 SENSITIVE Sensitive     * MODERATE KLEBSIELLA OXYTOCA  Aerobic/Anaerobic Culture w Gram Stain (surgical/deep wound)     Status: None (Preliminary result)   Collection Time: 10/21/20  3:07 PM   Specimen: PATH Digit amputation; Tissue  Result Value Ref Range Status   Specimen Description   Final    FOOT  RIGHT 5TH DIGIT AMPUTATION Performed at [facility], [address], [place], [place] [address]    Special Requests   Final    NONE Performed at [facility], [place], [place] [address]    Gram Stain   Final    FEW SQUAMOUS EPITHELIAL CELLS PRESENT FEW WBC SEEN FEW  GRAM POSITIVE COCCI    Culture   Final    FEW STREPTOCOCCUS ANGINOSIS SUSCEPTIBILITIES TO FOLLOW Performed at [facility], [place] [address], [place], [place] [address]    Report Status PENDING  Incomplete         Radiology Studies: DG Chest Port 1 View  Result Date: 10/22/2020 CLINICAL DATA:  Dyspnea, cough. EXAM: PORTABLE CHEST 1 VIEW COMPARISON:  October 15, 2020. FINDINGS: The heart size and mediastinal contours are within normal limits. No pneumothorax is noted. Mild left lingular subsegmental atelectasis is noted. Right middle lobe opacity is noted concerning for atelectasis or pneumonia and possible right pleural effusion. The visualized skeletal structures are unremarkable. IMPRESSION: Right middle lobe opacity is noted concerning for atelectasis or pneumonia and possible right pleural effusion. Electronically Signed   By: Marijo Conception M.D.   On: 10/22/2020 11:33        Scheduled Meds:  acetic acid-hydrocortisone  3 drop Both EARS Q6H   aspirin EC  81 mg Oral Daily   atorvastatin  80 mg Oral q1800   Chlorhexidine Gluconate Cloth  6 each Topical Daily   cholecalciferol  4,000 Units Oral Weekly   cinacalcet  60 mg Oral Daily   docusate sodium  100 mg Oral Daily   feeding supplement (NEPRO CARB STEADY)  237 mL Oral BID BM   ferric citrate  210 mg Oral TID WC   heparin  5,000 Units Subcutaneous Q8H   insulin aspart  0-5 Units Subcutaneous QHS   insulin aspart  0-9 Units Subcutaneous TID WC   insulin aspart  2 Units Subcutaneous TID WC   insulin glargine-yfgn  36 Units Subcutaneous QHS   linaclotide  290 mcg Oral q morning   mometasone-formoterol  2 puff Inhalation BID   pantoprazole  40 mg Oral Daily   sodium chloride flush  3 mL Intravenous Q12H   Continuous Infusions:  sodium chloride Stopped (10/22/20 1200)   diphenhydrAMINE     piperacillin-tazobactam (ZOSYN)  IV 2.25 g (10/24/20 0543)     LOS: 9 days    Time spent: 30 mins     Wyvonnia Dusky, MD [facility] Pager [phone]  If 7PM-7AM, please contact night-coverage 10/24/2020, 8:15 AM

## 2020-10-24 NOTE — Progress Notes (Signed)
PT Cancellation Note  Patient Details Name: [name] MRN: [id number] DOB: [birth year]   Cancelled Treatment:    Reason Eval/Treat Not Completed: Other (comment): Per chart review no new or continue at transfer PT orders after recent surgical intervention with patient possibly requiring additional surgical intervention.  Discussed with Dr. Jimmye Norman and agreed to complete PT orders at this time but will reassess pt pending a change in status upon receipt of new PT orders.   Linus Salmons PT, DPT 10/24/20, 11:50 AM

## 2020-10-24 NOTE — Progress Notes (Signed)
OT Cancellation Note  Patient Details Name: [name] MRN: [id number] DOB: 1971-01-01   Cancelled Treatment:    Reason Eval/Treat Not Completed: Other (comment). OT continues to await new order or continue at transfer order per therapy guidelines secondary to surgical intervention on 8/27. OT to SIGN OFF. Please re-consult when pt is able to medically and actively participate with OT intervention.    Darleen Crocker, MS, OTR/L , CBIS ascom [phone]  10/24/20, 10:43 AM

## 2020-10-24 NOTE — Progress Notes (Signed)
   Date of Admission:  10/15/2020       Subjective: Pt has no specific complaint Says she is feeling okay No pain  Medications:   acetic acid-hydrocortisone  3 drop Both EARS Q6H   aspirin EC  81 mg Oral Daily   atorvastatin  80 mg Oral q1800   Chlorhexidine Gluconate Cloth  6 each Topical Daily   cholecalciferol  4,000 Units Oral Weekly   cinacalcet  60 mg Oral Daily   docusate sodium  100 mg Oral Daily   feeding supplement (NEPRO CARB STEADY)  237 mL Oral BID BM   ferric citrate  210 mg Oral TID WC   heparin  5,000 Units Subcutaneous Q8H   insulin aspart  0-5 Units Subcutaneous QHS   insulin aspart  0-9 Units Subcutaneous TID WC   insulin aspart  2 Units Subcutaneous TID WC   insulin glargine-yfgn  36 Units Subcutaneous QHS   linaclotide  290 mcg Oral q morning   mometasone-formoterol  2 puff Inhalation BID   pantoprazole  40 mg Oral Daily   sodium chloride flush  3 mL Intravenous Q12H    Objective: Vital signs in last 24 hours: Temp:  [97.6 F (36.4 C)-98.8 F (37.1 C)] 98.6 F (37 C) (08/30 1130) Pulse Rate:  [84-89] 86 (08/30 1130) Resp:  [16-21] 19 (08/30 1130) BP: (96-138)/(47-77) 116/67 (08/30 1130) SpO2:  [92 %-100 %] 97 % (08/30 1130)  PHYSICAL EXAM:  General: Alert, cooperative, no distress, appears stated age.  Head: Normocephalic, without obvious abnormality, atraumatic. Eyes: Conjunctivae clear, anicteric sclerae. Pupils are equal ENT Nares normal. No drainage or sinus tenderness. Lips, mucosa, and tongue normal. No Thrush Neck: Supple, symmetrical, no adenopathy, thyroid: non tender no carotid bruit and no JVD. Back: No CVA tenderness. Lungs: Clear to auscultation bilaterally. No Wheezing or Rhonchi. No rales. Heart: Regular rate and rhythm, no murmur, rub or gallop. Abdomen: Soft, non-tender,not distended. Bowel sounds normal. No masses Extremities: rt foot dresisng not removed     Skin:hyperpigmented papules Lymph: Cervical, supraclavicular  normal. Neurologic: Grossly non-focal  Lab Results Recent Labs    10/23/20 0456 10/24/20 0527  WBC 30.7* 28.2*  HGB 8.2* 8.1*  HCT 24.7* 24.2*  NA 126* 123*  K 4.5 5.6*  CL 88* 85*  CO2 23 18*  BUN 53* 68*  CREATININE 7.09* 8.43*   Liver Panel Recent Labs    10/23/20 0456 10/24/20 0527  PROT 6.8 7.0  ALBUMIN 2.4* 2.5*  AST 88* 346*  ALT 31 91*  ALKPHOS 474* 661*  BILITOT 1.1 1.2    Assessment/Plan: Diabetic foot infection with necrosis Partial 5th ray amputation. Also had a foot abscess, which has been drained- this is a complex abscess of  deep spaces. May need to go back for more surgery Bone pathology , margin has acute osteo Bone culture not finalized Continue zosyn until culture finalizes   ESRD on dialysis   DM On insulin  Discussed the management with patient

## 2020-10-24 NOTE — Progress Notes (Signed)
Patient venous pressures increased, was unable to readjust. Repeat cannulation was successful.

## 2020-10-24 NOTE — Progress Notes (Signed)
Pt completed tx successfully ran a lower bfr due to higher pressures in venous needle. 2kg removed during tx.

## 2020-10-24 NOTE — Progress Notes (Signed)
[facility]  ROUNDING NOTE   Subjective:   [name] was admitted to [facility] on 10/15/2020 for Gangrene [facility]) [I96] Diabetic foot ulcer ([place]) [E11.621, L97.509] Decreased pedal pulses [R09.89] Sepsis, due to unspecified organism, unspecified whether acute organ dysfunction present [facility]) [A41.9]  Last hemodialysis treatment was Saturday, 8/20. She was having hypotension on her treatment and she took midodrine to complete her treatment.   Patient seen during dialysis   HEMODIALYSIS FLOWSHEET:  Blood Flow Rate (mL/min): 300 mL/min Arterial Pressure (mmHg): 80 mmHg Venous Pressure (mmHg): 80 mmHg Transmembrane Pressure (mmHg): 80 mmHg Ultrafiltration Rate (mL/min): 70 mL/min Dialysate Flow Rate (mL/min): 500 ml/min Conductivity: Machine : 13.8 Conductivity: Machine : 13.8 Dialysis Fluid Bolus: Normal Saline Bolus Amount (mL): 200 mL  No complaints at the moment Podiatry does dressing changes every day and will determine if surgical I&D is needed.    Objective:  Vital signs in last 24 hours:  Temp:  [97.6 F (36.4 C)-98.8 F (37.1 C)] 98.6 F (37 C) (08/30 1130) Pulse Rate:  [84-89] 86 (08/30 1130) Resp:  [16-21] 19 (08/30 1130) BP: (96-138)/(47-77) 116/67 (08/30 1130) SpO2:  [92 %-100 %] 97 % (08/30 1130)  Weight change:  Filed Weights   10/15/20 1905 10/18/20 0928  Weight: 126.1 kg 126.1 kg    Intake/Output: No intake/output data recorded.   Intake/Output this shift:  Total I/O In: -  Out: 2000 [Other:2000]  Physical Exam: General: NAD, laying in bed  Head: Normocephalic, atraumatic. Moist oral mucosal membranes  Eyes: Anicteric  Lungs:  Clear to auscultation, O2 2L  Heart: Regular rate and rhythm  Abdomen:  Soft, nontender  Extremities:  trace peripheral edema. Right foot fifth toe amputation on 10/21/20, ace wrapped  Neurologic: Nonfocal, moving all four extremities  Skin: No lesions  Access: Left AVF    Basic Metabolic  Panel: Recent Labs  Lab 10/18/20 0602 10/19/20 0526 10/20/20 0757 10/21/20 0104 10/21/20 0655 10/22/20 0523 10/23/20 0456 10/24/20 0527  NA 130* 126* 126*  --  126* 128* 126* 123*  K 4.2 4.7 4.4  --  5.0 4.8 4.5 5.6*  CL 89* 85* 85*  --  87* 87* 88* 85*  CO2 25 24 25   --  23 26 23  18*  GLUCOSE 180* 325* 239*  --  126* 215* 131* 198*  BUN 48* 61* 45*  --  57* 41* 53* 68*  CREATININE 8.03* 8.86* 6.99*  --  8.04* 6.01* 7.09* 8.43*  CALCIUM 8.7* 7.8* 7.8*  --  7.8* 7.7* 7.3* 7.0*  MG 1.8 1.8 1.9 2.0 2.1  --   --   --   PHOS  --   --  4.5  --   --   --   --   --      Liver Function Tests: Recent Labs  Lab 10/22/20 0523 10/23/20 0456 10/24/20 0527  AST 60* 88* 346*  ALT 22 31 91*  ALKPHOS 477* 474* 661*  BILITOT 1.0 1.1 1.2  PROT 6.7 6.8 7.0  ALBUMIN 2.4* 2.4* 2.5*    No results for input(s): LIPASE, AMYLASE in the last 168 hours. No results for input(s): AMMONIA in the last 168 hours.  CBC: Recent Labs  Lab 10/20/20 0757 10/21/20 0655 10/22/20 0523 10/22/20 2242 10/23/20 0456 10/24/20 0527  WBC 27.7* 31.1* 31.3*  --  30.7* 28.2*  HGB 8.6* 8.3* 6.8* 8.3* 8.2* 8.1*  HCT 26.6* 25.7* 21.0* 24.7* 24.7* 24.2*  MCV 93.3 92.1 95.5  --  91.8  94.9  PLT 396 399 376  --  400 398     Cardiac Enzymes: No results for input(s): CKTOTAL, CKMB, CKMBINDEX, TROPONINI in the last 168 hours.  BNP: Invalid input(s): POCBNP  CBG: Recent Labs  Lab 10/23/20 1202 10/23/20 1628 10/23/20 2139 10/24/20 0727 10/24/20 1234  GLUCAP 98 207* 192* 197* 178*     Microbiology: Results for orders placed or performed during the hospital encounter of 10/15/20  Blood Culture (routine x 2)     Status: None   Collection Time: 10/15/20  8:03 PM   Specimen: BLOOD  Result Value Ref Range Status   Specimen Description BLOOD BLOOD RIGHT FOREARM  Final   Special Requests   Final    BOTTLES DRAWN AEROBIC AND ANAEROBIC Blood Culture adequate volume   Culture   Final    NO GROWTH 5  DAYS Performed at [facility], [address] [place], [place] [address]    Report Status 10/20/2020 FINAL  Final  Resp Panel by RT-PCR (Flu A&B, Covid) Nasopharyngeal Swab     Status: None   Collection Time: 10/15/20  8:26 PM   Specimen: Nasopharyngeal Swab; Nasopharyngeal(NP) swabs in vial transport medium  Result Value Ref Range Status   SARS Coronavirus 2 by RT PCR NEGATIVE NEGATIVE Final    Comment: (NOTE) SARS-CoV-2 target nucleic acids are NOT DETECTED.  The SARS-CoV-2 RNA is generally detectable in upper respiratory specimens during the acute phase of infection. The lowest concentration of SARS-CoV-2 viral copies this assay can detect is 138 copies/mL. A negative result does not preclude SARS-Cov-2 infection and should not be used as the sole basis for treatment or other patient management decisions. A negative result may occur with  improper specimen collection/handling, submission of specimen other than nasopharyngeal swab, presence of viral mutation(s) within the areas targeted by this assay, and inadequate number of viral copies(<138 copies/mL). A negative result must be combined with clinical observations, patient history, and epidemiological information. The expected result is Negative.  Fact Sheet for Patients:  EntrepreneurPulse.com.au  Fact Sheet for Healthcare Providers:  IncredibleEmployment.be  This test is no t yet approved or cleared by the [place] FDA and  has been authorized for detection and/or diagnosis of SARS-CoV-2 by FDA under an Emergency Use Authorization (EUA). This EUA will remain  in effect (meaning this test can be used) for the duration of the COVID-19 declaration under Section 564(b)(1) of the Act, 21 U.S.C.section 360bbb-3(b)(1), unless the authorization is terminated  or revoked sooner.       Influenza A by PCR NEGATIVE NEGATIVE Final   Influenza B by PCR NEGATIVE NEGATIVE Final     Comment: (NOTE) The Xpert Xpress SARS-CoV-2/FLU/RSV plus assay is intended as an aid in the diagnosis of influenza from Nasopharyngeal swab specimens and should not be used as a sole basis for treatment. Nasal washings and aspirates are unacceptable for Xpert Xpress SARS-CoV-2/FLU/RSV testing.  Fact Sheet for Patients: EntrepreneurPulse.com.au  Fact Sheet for Healthcare Providers: IncredibleEmployment.be  This test is not yet approved or cleared by the [place] FDA and has been authorized for detection and/or diagnosis of SARS-CoV-2 by FDA under an Emergency Use Authorization (EUA). This EUA will remain in effect (meaning this test can be used) for the duration of the COVID-19 declaration under Section 564(b)(1) of the Act, 21 U.S.C. section 360bbb-3(b)(1), unless the authorization is terminated or revoked.  Performed at [facility], [address], [place], [place] [address]   Blood Culture (routine x 2)  Status: None   Collection Time: 10/15/20  8:26 PM   Specimen: BLOOD  Result Value Ref Range Status   Specimen Description BLOOD RIGHT ANTECUBITAL  Final   Special Requests   Final    BOTTLES DRAWN AEROBIC AND ANAEROBIC Blood Culture adequate volume   Culture   Final    NO GROWTH 5 DAYS Performed at [facility], [address] [place], [place] [address]    Report Status 10/20/2020 FINAL  Final  Aerobic Culture w Gram Stain (superficial specimen)     Status: None   Collection Time: 10/17/20  8:52 PM   Specimen: Wound  Result Value Ref Range Status   Specimen Description WOUND  Final   Special Requests NONE  Final   Gram Stain   Final    RARE SQUAMOUS EPITHELIAL CELLS PRESENT FEW WBC PRESENT, PREDOMINANTLY MONONUCLEAR ABUNDANT GRAM VARIABLE ROD FEW GRAM POSITIVE COCCI    Culture   Final    MODERATE ENTEROCOCCUS FAECALIS MODERATE KLEBSIELLA OXYTOCA ABUNDANT DIPHTHEROIDS(CORYNEBACTERIUM  SPECIES) Standardized susceptibility testing for this organism is not available.    Report Status 10/20/2020 FINAL  Final   Organism ID, Bacteria ENTEROCOCCUS FAECALIS  Final   Organism ID, Bacteria KLEBSIELLA OXYTOCA  Final      Susceptibility   Enterococcus faecalis - MIC*    AMPICILLIN <=2 SENSITIVE Sensitive     VANCOMYCIN 1 SENSITIVE Sensitive     GENTAMICIN SYNERGY RESISTANT Resistant     * MODERATE ENTEROCOCCUS FAECALIS   Klebsiella oxytoca - MIC*    AMPICILLIN >=32 RESISTANT Resistant     CEFAZOLIN <=4 SENSITIVE Sensitive     CEFEPIME <=0.12 SENSITIVE Sensitive     CEFTAZIDIME <=1 SENSITIVE Sensitive     CEFTRIAXONE <=0.25 SENSITIVE Sensitive     CIPROFLOXACIN <=0.25 SENSITIVE Sensitive     GENTAMICIN <=1 SENSITIVE Sensitive     IMIPENEM <=0.25 SENSITIVE Sensitive     TRIMETH/SULFA <=20 SENSITIVE Sensitive     AMPICILLIN/SULBACTAM 8 SENSITIVE Sensitive     PIP/TAZO <=4 SENSITIVE Sensitive     * MODERATE KLEBSIELLA OXYTOCA  Aerobic/Anaerobic Culture w Gram Stain (surgical/deep wound)     Status: None (Preliminary result)   Collection Time: 10/21/20  3:07 PM   Specimen: PATH Digit amputation; Tissue  Result Value Ref Range Status   Specimen Description   Final    FOOT  RIGHT 5TH DIGIT AMPUTATION Performed at [facility], [address], [place], [place] [address]    Special Requests   Final    NONE Performed at [facility], [place], [place] [address]    Gram Stain   Final    FEW SQUAMOUS EPITHELIAL CELLS PRESENT FEW WBC SEEN FEW GRAM POSITIVE COCCI Performed at [facility], [place] [address], [place], [place] [address]    Culture   Final    FEW STREPTOCOCCUS ANGINOSIS NO ANAEROBES ISOLATED; CULTURE IN PROGRESS FOR 5 DAYS    Report Status PENDING  Incomplete   Organism ID, Bacteria STREPTOCOCCUS ANGINOSIS  Final      Susceptibility   Streptococcus anginosis - MIC*    PENICILLIN 0.12 SENSITIVE Sensitive     CEFTRIAXONE  0.5 SENSITIVE Sensitive     ERYTHROMYCIN 4 RESISTANT Resistant     LEVOFLOXACIN 0.5 SENSITIVE Sensitive     VANCOMYCIN 0.5 SENSITIVE Sensitive     * FEW STREPTOCOCCUS ANGINOSIS    Coagulation Studies: No results for input(s): LABPROT, INR in the last 72 hours.   Urinalysis: No  results for input(s): COLORURINE, LABSPEC, [place], GLUCOSEU, HGBUR, BILIRUBINUR, KETONESUR, PROTEINUR, UROBILINOGEN, NITRITE, LEUKOCYTESUR in the last 72 hours.  Invalid input(s): APPERANCEUR    Imaging: No results found.   Medications:    sodium chloride Stopped (10/22/20 1200)   diphenhydrAMINE     piperacillin-tazobactam (ZOSYN)  IV 2.25 g (10/24/20 0543)    acetic acid-hydrocortisone  3 drop Both EARS Q6H   aspirin EC  81 mg Oral Daily   atorvastatin  80 mg Oral q1800   Chlorhexidine Gluconate Cloth  6 each Topical Daily   cholecalciferol  4,000 Units Oral Weekly   cinacalcet  60 mg Oral Daily   docusate sodium  100 mg Oral Daily   feeding supplement (NEPRO CARB STEADY)  237 mL Oral BID BM   ferric citrate  210 mg Oral TID WC   heparin  5,000 Units Subcutaneous Q8H   insulin aspart  0-5 Units Subcutaneous QHS   insulin aspart  0-9 Units Subcutaneous TID WC   insulin aspart  2 Units Subcutaneous TID WC   insulin glargine-yfgn  36 Units Subcutaneous QHS   linaclotide  290 mcg Oral q morning   mometasone-formoterol  2 puff Inhalation BID   pantoprazole  40 mg Oral Daily   sodium chloride flush  3 mL Intravenous Q12H   sodium chloride, acetaminophen, albuterol, albuterol, ALPRAZolam, alum & mag hydroxide-simeth, calcium carbonate, dextromethorphan-guaiFENesin, diphenhydrAMINE, diphenhydrAMINE, hydrALAZINE, HYDROmorphone (DILAUDID) injection, ipratropium-albuterol, lactulose, LORazepam, metoprolol tartrate, ondansetron (ZOFRAN) IV, ondansetron, oxyCODONE, phenol, polyethylene glycol, sodium chloride flush, traZODone  Assessment/ Plan:  [name] is a 50 y.o. black female with end stage  renal disease on hemodialysis, diabetes mellitus type II, hypertension, congestive heart failure, COPD, who is admitted to [facility] on 10/15/2020 for Gangrene [facility]) [I96] Diabetic foot ulcer ([place]) [W38.937, L97.509] Decreased pedal pulses [R09.89] Sepsis, due to unspecified organism, unspecified whether acute organ dysfunction present [facility]) [A41.9]  [facility] Left AVF 127.5kg  End Stage Renal Disease: last dialysis was 10/14/20. Received dialysis today. UF goal 2L removed. Tolerated well. Next treatment scheduled for Thursday.  Hypotension: secondary to infection. Holding home regimen torsemide, losartan, clonidine.116/67. Will monitor for need of Midodrine with HD treatments  Anemia with chronic kidney disease: hemoglobin 8.1. Low dose EPO with next HD treatment.   Secondary Hyperparathyroidism with hyeprphosphatemia: there is risk for calciphylaxis. Outpatinet labs on 8/9: phos 10.6, calcium 8.2, PTH 1456.  - cinacalcet and sevelamer with meals - Phosphorus at goal    Diabetes mellitus type II with chronic kidney disease: insulin dependent. Complicated with diabetic foot ulcer. History of poor control. Hemoglobin A1c 9.2% on 09/19/2020.  - Appreciate podiatry and vascular input.  - empiric ceftriaxone and metronidazole.  - Vascular performed right lower angiogram today. 30% mild lesion found popliteal artery. Deemed adequate for wound healing.  - Podiatry performed right foot, partial fifth toe amputation on 10/21/20. Daily dressing changes will determine if further intervention is needed.      LOS: 9 Erasmus Bistline 8/30/20221:43 PM

## 2020-10-25 DIAGNOSIS — I96 Gangrene, not elsewhere classified: Secondary | ICD-10-CM

## 2020-10-25 DIAGNOSIS — T17908S Unspecified foreign body in respiratory tract, part unspecified causing other injury, sequela: Secondary | ICD-10-CM

## 2020-10-25 DIAGNOSIS — N186 End stage renal disease: Secondary | ICD-10-CM | POA: Diagnosis not present

## 2020-10-25 DIAGNOSIS — R0989 Other specified symptoms and signs involving the circulatory and respiratory systems: Secondary | ICD-10-CM | POA: Diagnosis not present

## 2020-10-25 DIAGNOSIS — Z992 Dependence on renal dialysis: Secondary | ICD-10-CM | POA: Diagnosis not present

## 2020-10-25 DIAGNOSIS — E11621 Type 2 diabetes mellitus with foot ulcer: Secondary | ICD-10-CM | POA: Diagnosis not present

## 2020-10-25 DIAGNOSIS — A419 Sepsis, unspecified organism: Secondary | ICD-10-CM | POA: Diagnosis not present

## 2020-10-25 DIAGNOSIS — I739 Peripheral vascular disease, unspecified: Secondary | ICD-10-CM | POA: Diagnosis not present

## 2020-10-25 DIAGNOSIS — L97514 Non-pressure chronic ulcer of other part of right foot with necrosis of bone: Secondary | ICD-10-CM

## 2020-10-25 DIAGNOSIS — H9191 Unspecified hearing loss, right ear: Secondary | ICD-10-CM

## 2020-10-25 DIAGNOSIS — T17908A Unspecified foreign body in respiratory tract, part unspecified causing other injury, initial encounter: Secondary | ICD-10-CM

## 2020-10-25 LAB — CBC
HCT: 24.1 % — ABNORMAL LOW (ref 36.0–46.0)
Hemoglobin: 7.9 g/dL — ABNORMAL LOW (ref 12.0–15.0)
MCH: 31 pg (ref 26.0–34.0)
MCHC: 32.8 g/dL (ref 30.0–36.0)
MCV: 94.5 fL (ref 80.0–100.0)
Platelets: 389 10*3/uL (ref 150–400)
RBC: 2.55 MIL/uL — ABNORMAL LOW (ref 3.87–5.11)
RDW: 14.8 % (ref 11.5–15.5)
WBC: 25.9 10*3/uL — ABNORMAL HIGH (ref 4.0–10.5)
nRBC: 0.3 % — ABNORMAL HIGH (ref 0.0–0.2)

## 2020-10-25 LAB — GLUCOSE, CAPILLARY
Glucose-Capillary: 127 mg/dL — ABNORMAL HIGH (ref 70–99)
Glucose-Capillary: 146 mg/dL — ABNORMAL HIGH (ref 70–99)
Glucose-Capillary: 154 mg/dL — ABNORMAL HIGH (ref 70–99)
Glucose-Capillary: 165 mg/dL — ABNORMAL HIGH (ref 70–99)

## 2020-10-25 LAB — COMPREHENSIVE METABOLIC PANEL
ALT: 167 U/L — ABNORMAL HIGH (ref 0–44)
AST: 561 U/L — ABNORMAL HIGH (ref 15–41)
Albumin: 2.3 g/dL — ABNORMAL LOW (ref 3.5–5.0)
Alkaline Phosphatase: 661 U/L — ABNORMAL HIGH (ref 38–126)
Anion gap: 16 — ABNORMAL HIGH (ref 5–15)
BUN: 51 mg/dL — ABNORMAL HIGH (ref 6–20)
CO2: 23 mmol/L (ref 22–32)
Calcium: 7 mg/dL — ABNORMAL LOW (ref 8.9–10.3)
Chloride: 87 mmol/L — ABNORMAL LOW (ref 98–111)
Creatinine, Ser: 6.74 mg/dL — ABNORMAL HIGH (ref 0.44–1.00)
GFR, Estimated: 7 mL/min — ABNORMAL LOW (ref >60)
Glucose, Bld: 199 mg/dL — ABNORMAL HIGH (ref 70–99)
Potassium: 3.9 mmol/L (ref 3.5–5.1)
Sodium: 126 mmol/L — ABNORMAL LOW (ref 135–145)
Total Bilirubin: 1.2 mg/dL (ref 0.3–1.2)
Total Protein: 6.8 g/dL (ref 6.5–8.1)

## 2020-10-25 MED ORDER — LORATADINE 10 MG PO TABS
10.0000 mg | ORAL_TABLET | Freq: Every day | ORAL | Status: DC
  Administered 2020-10-25 – 2020-11-10 (×16): 10 mg via ORAL
  Filled 2020-10-25 (×17): qty 1

## 2020-10-25 MED ORDER — RENA-VITE PO TABS
1.0000 | ORAL_TABLET | Freq: Every day | ORAL | Status: DC
  Administered 2020-10-25 – 2020-11-09 (×16): 1 via ORAL
  Filled 2020-10-25 (×17): qty 1

## 2020-10-25 MED ORDER — ASCORBIC ACID 500 MG PO TABS
500.0000 mg | ORAL_TABLET | Freq: Every day | ORAL | Status: DC
  Administered 2020-10-25 – 2020-11-10 (×17): 500 mg via ORAL
  Filled 2020-10-25 (×17): qty 1

## 2020-10-25 NOTE — Progress Notes (Signed)
[facility] Daily Progress Note  10/18/20:             1.  Ultrasound guidance for vascular access left femoral artery             2.  Catheter placement into right SFA from left femoral approach             3.  Aortogram and selective right lower extremity angiogram             4.  StarClose closure device left femoral artery  Subjective: Patient is without complaint this afternoon.  No acute issues overnight.  Objective: Vitals:   10/24/20 1951 10/25/20 0244 10/25/20 0750 10/25/20 0814  BP: 96/62 (!) 97/58  121/85  Pulse: 89 81  84  Resp: 20 20  16   Temp: 98.8 F (37.1 C) 97.7 F (36.5 C)  98.5 F (36.9 C)  TempSrc: Oral Oral  Oral  SpO2: 100% 97% 95% 96%  Weight:      Height:        Intake/Output Summary (Last 24 hours) at 10/25/2020 1515 Last data filed at 10/25/2020 1430 Gross per 24 hour  Intake 120 ml  Output --  Net 120 ml   Physical Exam: A&Ox3, NAD CV: RRR Pulmonary: CTA Bilaterally Abdomen: Soft, Nontender, Nondistended Access site:             Clean dry and intact.  No swelling or drainage noted. Vascular:             Right lower extremity : Thigh soft.  Calf soft.  Warm distally to toes.  Unable to palpate pedal pulses due to body habitus.   Laboratory: CBC    Component Value Date/Time   WBC 25.9 (H) 10/25/2020 0410   HGB 7.9 (L) 10/25/2020 0410   HGB 12.4 01/11/2014 1324   HCT 24.1 (L) 10/25/2020 0410   HCT 39.2 01/11/2014 1324   PLT 389 10/25/2020 0410   PLT 370 01/11/2014 1324   BMET    Component Value Date/Time   NA 126 (L) 10/25/2020 0410   NA 135 (L) 11/21/2013 1153   K 3.9 10/25/2020 0410   K 4.1 11/21/2013 1153   CL 87 (L) 10/25/2020 0410   CL 98 11/21/2013 1153   CO2 23 10/25/2020 0410   CO2 30 11/21/2013 1153   GLUCOSE 199 (H) 10/25/2020 0410   GLUCOSE 287 (H) 11/21/2013 1153   BUN 51 (H) 10/25/2020 0410   BUN 16 11/21/2013 1153   CREATININE 6.74 (H) 10/25/2020 0410   CREATININE 0.85 11/21/2013 1153    CALCIUM 7.0 (L) 10/25/2020 0410   CALCIUM 8.7 11/21/2013 1153   GFRNONAA 7 (L) 10/25/2020 0410   GFRNONAA >60 11/21/2013 1153   GFRAA 15 (L) 04/26/2017 0609   GFRAA >60 11/21/2013 1153   Assessment/Planning: The patient is a 50 year old female with multiple medical issues including known chronic wound to the right foot s/p right lower extremity angiogram without any need for intervention due to lack of hemodynamically significant disease  1) Unsalvageable Right Foot: Patient has undergone a right lower extremity angiogram last week however was not a candidate for revascularization as she did not have any significant hemodynamic disease.  The patient has been followed by the podiatry service who feels the severity of her chronic wounds to the right foot are unsalvageable.  They are recommending below the knee amputation.  During my conversation with the patient today in regard to the procedure, risks and benefits  of a below the knee amputation she noted that she would like to receive a second opinion both from podiatry and surgery.  I explained to her that we were the only vascular surgery service in the hospital however it is in her right to see another surgical service or seek one outside of this hospital system.  She understands her risk of sepsis, further tissue loss or even death is a possibility the longer she waits.  Will relay this information to the rest of the clinical team.  At this time, I will leave the patient on the operating room schedule for tomorrow just in case she changes her mind.  I will also leave her NPO at midnight.  2) End-Stage Renal Disease: The patient has been followed by the nephrology service while in house. Denies any issues with her current dialysis access.  Discussed with Dr. Ellis Parents Skylen Danielsen PA-C 10/25/2020 3:15 PM

## 2020-10-25 NOTE — Progress Notes (Signed)
  Subjective:  Patient ID: [name], female    DOB: [birth year],  MRN: [id number]  Not have any pain denies fevers chills nausea vomiting, understandably upset and trying to process what is going on with the foot  Negative for chest pain and shortness of breath Fever: no Night sweats: no Constitutional signs: no Review of all other systems is negative Objective:   Vitals:   10/25/20 0814 10/25/20 1559  BP: 121/85 114/70  Pulse: 84 88  Resp: 16 16  Temp: 98.5 F (36.9 C) 98.2 F (36.8 C)  SpO2: 96% 100%   General AA&O x3. Normal mood and affect.  Vascular Dorsalis pedis and posterior tibial pulses weakly palpable  Neurologic Epicritic sensation grossly reduced.  Dermatologic Area of necrosis on plantar vault, the foot is cold from the proximal margin of this to the tip of the toes plantarly, the dorsal flap is cold to the level of the tarsometatarsal joints, some purulence on the packing change medially and laterally  Orthopedic: MMT 5/5 in dorsiflexion, plantarflexion, inversion, and eversion. Normal joint ROM without pain or crepitus.   Angiogram with good three-vessel runoff to foot, I again reviewed the images today and she does have an intact perforator but these were prior to the last infection  Assessment & Plan:  Patient was evaluated and treated and all questions answered.  Gangrene of f right foot -Evaluated the patient and I met with her for approximately 20 to 25 minutes and discussed the clinical findings with her as well as her progress and prognosis with her and her family.  Change her dressing and change the packing and did find a small amount of purulence which I expressed.  Fresh iodoform packing was placed into the wound and dry sterile gauze dressings were applied.  - I walked her and her family through a very proximal partial foot amputation versus a below-knee and explained to her why I think partial foot has a very low success chance which she  understands but she is trying to piece it together.  Almost the entire plantar foot is cold, her dorsal flap is better perfused but would likely need a [place] amputation which I do not think would be functional and would be better off with a prosthetic.  She says she is thinking about try to get a second opinion from someone at [facility] or [facility] in May asked to be transferred.  I think if she wants to go this route she should probably go back to the OR here for at least a further extensive I&D to clear out any further infection prior to doing so, I found some pus in the medial plantar foot when I changed the packing in the distal plantar forefoot is beginning to necrose and likely will be gangrenous soon  Unfortunate very sad situation with no clear answer of her best prospects but I would still recommend below-knee amputation for prosthetic over a partial foot amputation with a dorsal flap advance plantarly which I think would have a very low chance of success.   Criselda Peaches, DPM  Accessible via secure chat for questions or concerns.

## 2020-10-25 NOTE — Progress Notes (Signed)
Patient ID: [name], female   DOB: [birth year], 50 y.o.   MRN: [id number] [facility] PROGRESS NOTE  [name] DGL:[id number] DOB: [birth year] DOA: 10/15/2020 PCP: [name] Berry, NP  HPI/Subjective: Patient states that after surgery she could not hear very well.  Hearing a little bit better now.  Some pain in her foot.  Initially admitted 8/21 for diabetic foot ulcer and decreased pulses.  Objective: Vitals:   10/25/20 0750 10/25/20 0814  BP:  121/85  Pulse:  84  Resp:  16  Temp:  98.5 F (36.9 C)  SpO2: 95% 96%    Intake/Output Summary (Last 24 hours) at 10/25/2020 1439 Last data filed at 10/25/2020 1430 Gross per 24 hour  Intake 120 ml  Output --  Net 120 ml   Filed Weights   10/15/20 1905 10/18/20 0928  Weight: 126.1 kg 126.1 kg    ROS: Review of Systems  HENT:  Positive for hearing loss.   Respiratory:  Negative for shortness of breath.   Cardiovascular:  Negative for chest pain.  Gastrointestinal:  Negative for abdominal pain, nausea and vomiting.  Exam: Physical Exam HENT:     Head: Normocephalic.     Left Ear: Tympanic membrane normal.     Ears:     Comments: Tympanic membrane on the right slight bulging only slight erythema.    Mouth/Throat:     Pharynx: No oropharyngeal exudate.  Eyes:     General: Lids are normal.     Conjunctiva/sclera: Conjunctivae normal.  Cardiovascular:     Rate and Rhythm: Normal rate and regular rhythm.     Heart sounds: Normal heart sounds, S1 normal and S2 normal.  Pulmonary:     Breath sounds: No decreased breath sounds, wheezing, rhonchi or rales.  Abdominal:     Palpations: Abdomen is soft.     Tenderness: There is no abdominal tenderness.  Skin:    General: Skin is warm.     Comments: Right leg covered.  Neurological:     Mental Status: She is alert and oriented to person, place, and time.      Scheduled Meds:  acetic acid-hydrocortisone  3 drop Both EARS Q6H   aspirin EC  81 mg Oral Daily    Chlorhexidine Gluconate Cloth  6 each Topical Daily   cholecalciferol  4,000 Units Oral Weekly   cinacalcet  60 mg Oral Daily   docusate sodium  100 mg Oral Daily   feeding supplement (NEPRO CARB STEADY)  237 mL Oral BID BM   ferric citrate  210 mg Oral TID WC   heparin  5,000 Units Subcutaneous Q8H   insulin aspart  0-5 Units Subcutaneous QHS   insulin aspart  0-9 Units Subcutaneous TID WC   insulin aspart  2 Units Subcutaneous TID WC   insulin glargine-yfgn  36 Units Subcutaneous QHS   ipratropium-albuterol  3 mL Nebulization TID   linaclotide  290 mcg Oral q morning   loratadine  10 mg Oral Daily   mometasone-formoterol  2 puff Inhalation BID   pantoprazole  40 mg Oral Daily   sodium chloride flush  3 mL Intravenous Q12H   Continuous Infusions:  sodium chloride Stopped (10/22/20 1200)   diphenhydrAMINE     piperacillin-tazobactam (ZOSYN)  IV 2.25 g (10/25/20 1436)    Assessment/Plan:  Diabetic foot ulcers with gangrene of the fifth toe and osteomyelitis.  Patient on IV Zosyn as per ID.  Patient with status post fifth ray amputation and I&D  of the right foot on 8/27.  Vascular surgery reconsulted for wounds not healing very well. Peripheral vascular disease.  Angiogram on 8/24 showed popliteal artery had a mild lesion 30% or less below the knee.  Continue aspirin. Aspiration pneumonia currently on IV Zosyn Decreased hearing.  Tympanic membrane slight erythema.  Start Claritin.  If no improvement may need ENT as outpatient. Type 2 diabetes mellitus with end-stage renal disease.  Continue hemodialysis as per nephrology.  Patient on glargine insulin and sliding scale and short acting insulin prior to meals.   Anemia of chronic disease.  Last hemoglobin 7.9.  Patient did receive a unit of packed red blood cells on 10/22/2020. Hyponatremia.  Managed with hemodialysis Chronic respiratory failure with hypoxia on 3 to 4 L of oxygen, on [place] Morbid obesity with a BMI of 46.98 (used weight  and height currently in computer)     Code Status:     Code Status Orders  (From admission, onward)           Start     Ordered   10/15/20 2114  Full code  Continuous        10/15/20 2119           Code Status History     Date Active Date Inactive Code Status Order ID Comments User Context   03/24/2017 1953 04/28/2017 1906 Full Code [id number]  Hillary Bow, MD ED   03/03/2017 1420 03/10/2017 2131 Full Code [id number]  Fritzi Mandes, MD ED   01/07/2017 1725 01/10/2017 1900 Full Code [id number]  Demetrios Loll, MD Inpatient   11/08/2016 0450 11/16/2016 1717 Full Code [id number]  Hugelmeyer, Alexis, DO Inpatient   06/25/2016 1447 06/29/2016 1815 Full Code [id number]  Hillary Bow, MD ED   08/09/2015 1757 08/13/2015 1721 Full Code [id number]  Bettey Costa, MD Inpatient   12/31/2014 2025 01/03/2015 1749 Full Code [id number]  Hower, Aaron Mose, MD ED   12/15/2014 1154 12/19/2014 1657 Full Code [id number]  Hillary Bow, MD ED      Family Communication:  Disposition Plan: Status is: Inpatient  Dispo: The patient is from: Home              Anticipated d/c is to: To be determined based on clinical course              Patient currently being treated for diabetic wound infection.  Awaiting vascular reevaluation for wounds.   Difficult to place patient.  No.  Consultants: Vascular surgery  Antibiotics: Zosyn  Time spent: 27 minutes  [place]

## 2020-10-25 NOTE — Progress Notes (Signed)
[facility]  ROUNDING NOTE   Subjective:   [name] was admitted to [facility] on 10/15/2020 for Gangrene [facility]) [I96] Diabetic foot ulcer ([place]) [E11.621, L97.509] Decreased pedal pulses [R09.89] Sepsis, due to unspecified organism, unspecified whether acute organ dysfunction present [facility]) [A41.9]  Last hemodialysis treatment was Saturday, 8/20. She was having hypotension on her treatment and she took midodrine to complete her treatment.   Patient seen sitting up in bed Alert and oriented, but slow to respond She states the pain medication make her "swimmy headed" and she has only taken Tylenol since yesterday to clear the fog. States the pain from her foot is worse today so she may have to request the stronger stuff. Tolerating meals  Objective:  Vital signs in last 24 hours:  Temp:  [97.7 F (36.5 C)-98.9 F (37.2 C)] 98.5 F (36.9 C) (08/31 0814) Pulse Rate:  [81-90] 84 (08/31 0814) Resp:  [16-20] 16 (08/31 0814) BP: (96-121)/(58-85) 121/85 (08/31 0814) SpO2:  [95 %-100 %] 96 % (08/31 0814)  Weight change:  Filed Weights   10/15/20 1905 10/18/20 0928  Weight: 126.1 kg 126.1 kg    Intake/Output: I/O last 3 completed shifts: In: -  Out: 2000 [Other:2000]   Intake/Output this shift:  No intake/output data recorded.  Physical Exam: General: NAD, laying in bed  Head: Normocephalic, atraumatic. Moist oral mucosal membranes  Eyes: Anicteric  Lungs:  Clear to auscultation, O2 2L  Heart: Regular rate and rhythm  Abdomen:  Soft, nontender  Extremities:  trace peripheral edema. Right foot fifth toe amputation on 10/21/20, ace wrapped  Neurologic: Nonfocal, moving all four extremities  Skin: No lesions  Access: Left AVF    Basic Metabolic Panel: Recent Labs  Lab 10/19/20 0526 10/20/20 0757 10/21/20 0104 10/21/20 0655 10/22/20 0523 10/23/20 0456 10/24/20 0527 10/25/20 0410  NA 126* 126*  --  126* 128* 126* 123* 126*  K 4.7 4.4  --  5.0 4.8 4.5  5.6* 3.9  CL 85* 85*  --  87* 87* 88* 85* 87*  CO2 24 25  --  23 26 23  18* 23  GLUCOSE 325* 239*  --  126* 215* 131* 198* 199*  BUN 61* 45*  --  57* 41* 53* 68* 51*  CREATININE 8.86* 6.99*  --  8.04* 6.01* 7.09* 8.43* 6.74*  CALCIUM 7.8* 7.8*  --  7.8* 7.7* 7.3* 7.0* 7.0*  MG 1.8 1.9 2.0 2.1  --   --   --   --   PHOS  --  4.5  --   --   --   --   --   --      Liver Function Tests: Recent Labs  Lab 10/22/20 0523 10/23/20 0456 10/24/20 0527 10/25/20 0410  AST 60* 88* 346* 561*  ALT 22 31 91* 167*  ALKPHOS 477* 474* 661* 661*  BILITOT 1.0 1.1 1.2 1.2  PROT 6.7 6.8 7.0 6.8  ALBUMIN 2.4* 2.4* 2.5* 2.3*    No results for input(s): LIPASE, AMYLASE in the last 168 hours. No results for input(s): AMMONIA in the last 168 hours.  CBC: Recent Labs  Lab 10/21/20 0655 10/22/20 0523 10/22/20 2242 10/23/20 0456 10/24/20 0527 10/25/20 0410  WBC 31.1* 31.3*  --  30.7* 28.2* 25.9*  HGB 8.3* 6.8* 8.3* 8.2* 8.1* 7.9*  HCT 25.7* 21.0* 24.7* 24.7* 24.2* 24.1*  MCV 92.1 95.5  --  91.8 94.9 94.5  PLT 399 376  --  400 398 389  Cardiac Enzymes: No results for input(s): CKTOTAL, CKMB, CKMBINDEX, TROPONINI in the last 168 hours.  BNP: Invalid input(s): POCBNP  CBG: Recent Labs  Lab 10/24/20 1234 10/24/20 1646 10/24/20 2116 10/25/20 0730 10/25/20 1209  GLUCAP 178* 305* 222* 127* 146*     Microbiology: Results for orders placed or performed during the hospital encounter of 10/15/20  Blood Culture (routine x 2)     Status: None   Collection Time: 10/15/20  8:03 PM   Specimen: BLOOD  Result Value Ref Range Status   Specimen Description BLOOD BLOOD RIGHT FOREARM  Final   Special Requests   Final    BOTTLES DRAWN AEROBIC AND ANAEROBIC Blood Culture adequate volume   Culture   Final    NO GROWTH 5 DAYS Performed at [facility], [address] [place], [place] [address]    Report Status 10/20/2020 FINAL  Final  Resp Panel by RT-PCR (Flu A&B, Covid)  Nasopharyngeal Swab     Status: None   Collection Time: 10/15/20  8:26 PM   Specimen: Nasopharyngeal Swab; Nasopharyngeal(NP) swabs in vial transport medium  Result Value Ref Range Status   SARS Coronavirus 2 by RT PCR NEGATIVE NEGATIVE Final    Comment: (NOTE) SARS-CoV-2 target nucleic acids are NOT DETECTED.  The SARS-CoV-2 RNA is generally detectable in upper respiratory specimens during the acute phase of infection. The lowest concentration of SARS-CoV-2 viral copies this assay can detect is 138 copies/mL. A negative result does not preclude SARS-Cov-2 infection and should not be used as the sole basis for treatment or other patient management decisions. A negative result may occur with  improper specimen collection/handling, submission of specimen other than nasopharyngeal swab, presence of viral mutation(s) within the areas targeted by this assay, and inadequate number of viral copies(<138 copies/mL). A negative result must be combined with clinical observations, patient history, and epidemiological information. The expected result is Negative.  Fact Sheet for Patients:  EntrepreneurPulse.com.au  Fact Sheet for Healthcare Providers:  IncredibleEmployment.be  This test is no t yet approved or cleared by the [place] FDA and  has been authorized for detection and/or diagnosis of SARS-CoV-2 by FDA under an Emergency Use Authorization (EUA). This EUA will remain  in effect (meaning this test can be used) for the duration of the COVID-19 declaration under Section 564(b)(1) of the Act, 21 U.S.C.section 360bbb-3(b)(1), unless the authorization is terminated  or revoked sooner.       Influenza A by PCR NEGATIVE NEGATIVE Final   Influenza B by PCR NEGATIVE NEGATIVE Final    Comment: (NOTE) The Xpert Xpress SARS-CoV-2/FLU/RSV plus assay is intended as an aid in the diagnosis of influenza from Nasopharyngeal swab specimens and should not be  used as a sole basis for treatment. Nasal washings and aspirates are unacceptable for Xpert Xpress SARS-CoV-2/FLU/RSV testing.  Fact Sheet for Patients: EntrepreneurPulse.com.au  Fact Sheet for Healthcare Providers: IncredibleEmployment.be  This test is not yet approved or cleared by the [place] FDA and has been authorized for detection and/or diagnosis of SARS-CoV-2 by FDA under an Emergency Use Authorization (EUA). This EUA will remain in effect (meaning this test can be used) for the duration of the COVID-19 declaration under Section 564(b)(1) of the Act, 21 U.S.C. section 360bbb-3(b)(1), unless the authorization is terminated or revoked.  Performed at [facility], [address], [place], [place] [address]   Blood Culture (routine x 2)     Status: None   Collection Time: 10/15/20  8:26 PM  Specimen: BLOOD  Result Value Ref Range Status   Specimen Description BLOOD RIGHT ANTECUBITAL  Final   Special Requests   Final    BOTTLES DRAWN AEROBIC AND ANAEROBIC Blood Culture adequate volume   Culture   Final    NO GROWTH 5 DAYS Performed at [facility], [address] [place], [place] [address]    Report Status 10/20/2020 FINAL  Final  Aerobic Culture w Gram Stain (superficial specimen)     Status: None   Collection Time: 10/17/20  8:52 PM   Specimen: Wound  Result Value Ref Range Status   Specimen Description WOUND  Final   Special Requests NONE  Final   Gram Stain   Final    RARE SQUAMOUS EPITHELIAL CELLS PRESENT FEW WBC PRESENT, PREDOMINANTLY MONONUCLEAR ABUNDANT GRAM VARIABLE ROD FEW GRAM POSITIVE COCCI    Culture   Final    MODERATE ENTEROCOCCUS FAECALIS MODERATE KLEBSIELLA OXYTOCA ABUNDANT DIPHTHEROIDS(CORYNEBACTERIUM SPECIES) Standardized susceptibility testing for this organism is not available.    Report Status 10/20/2020 FINAL  Final   Organism ID, Bacteria ENTEROCOCCUS FAECALIS  Final    Organism ID, Bacteria KLEBSIELLA OXYTOCA  Final      Susceptibility   Enterococcus faecalis - MIC*    AMPICILLIN <=2 SENSITIVE Sensitive     VANCOMYCIN 1 SENSITIVE Sensitive     GENTAMICIN SYNERGY RESISTANT Resistant     * MODERATE ENTEROCOCCUS FAECALIS   Klebsiella oxytoca - MIC*    AMPICILLIN >=32 RESISTANT Resistant     CEFAZOLIN <=4 SENSITIVE Sensitive     CEFEPIME <=0.12 SENSITIVE Sensitive     CEFTAZIDIME <=1 SENSITIVE Sensitive     CEFTRIAXONE <=0.25 SENSITIVE Sensitive     CIPROFLOXACIN <=0.25 SENSITIVE Sensitive     GENTAMICIN <=1 SENSITIVE Sensitive     IMIPENEM <=0.25 SENSITIVE Sensitive     TRIMETH/SULFA <=20 SENSITIVE Sensitive     AMPICILLIN/SULBACTAM 8 SENSITIVE Sensitive     PIP/TAZO <=4 SENSITIVE Sensitive     * MODERATE KLEBSIELLA OXYTOCA  Aerobic/Anaerobic Culture w Gram Stain (surgical/deep wound)     Status: None (Preliminary result)   Collection Time: 10/21/20  3:07 PM   Specimen: PATH Digit amputation; Tissue  Result Value Ref Range Status   Specimen Description   Final    FOOT  RIGHT 5TH DIGIT AMPUTATION Performed at [facility], [address] [place], [place] [address]    Special Requests   Final    NONE Performed at [facility], [place], [place] [address]    Gram Stain   Final    FEW SQUAMOUS EPITHELIAL CELLS PRESENT FEW WBC SEEN FEW GRAM POSITIVE COCCI Performed at [facility], [place] [address], [place],  [address]    Culture   Final    FEW STREPTOCOCCUS ANGINOSIS NO ANAEROBES ISOLATED; CULTURE IN PROGRESS FOR 5 DAYS    Report Status PENDING  Incomplete   Organism ID, Bacteria STREPTOCOCCUS ANGINOSIS  Final      Susceptibility   Streptococcus anginosis - MIC*    PENICILLIN 0.12 SENSITIVE Sensitive     CEFTRIAXONE 0.5 SENSITIVE Sensitive     ERYTHROMYCIN 4 RESISTANT Resistant     LEVOFLOXACIN 0.5 SENSITIVE Sensitive     VANCOMYCIN 0.5 SENSITIVE Sensitive     * FEW STREPTOCOCCUS ANGINOSIS     Coagulation Studies: No results for input(s): LABPROT, INR in the last 72 hours.   Urinalysis: No results for input(s): COLORURINE, LABSPEC, [place], [place], [place], [place], [place], [place], [place],  NITRITE, LEUKOCYTESUR in the last 72 hours.  Invalid input(s): APPERANCEUR    Imaging: No results found.   Medications:    sodium chloride Stopped (10/22/20 1200)   diphenhydrAMINE     piperacillin-tazobactam (ZOSYN)  IV 2.25 g (10/25/20 0554)    acetic acid-hydrocortisone  3 drop Both EARS Q6H   aspirin EC  81 mg Oral Daily   atorvastatin  80 mg Oral q1800   Chlorhexidine Gluconate Cloth  6 each Topical Daily   cholecalciferol  4,000 Units Oral Weekly   cinacalcet  60 mg Oral Daily   docusate sodium  100 mg Oral Daily   feeding supplement (NEPRO CARB STEADY)  237 mL Oral BID BM   ferric citrate  210 mg Oral TID WC   heparin  5,000 Units Subcutaneous Q8H   insulin aspart  0-5 Units Subcutaneous QHS   insulin aspart  0-9 Units Subcutaneous TID WC   insulin aspart  2 Units Subcutaneous TID WC   insulin glargine-yfgn  36 Units Subcutaneous QHS   ipratropium-albuterol  3 mL Nebulization TID   linaclotide  290 mcg Oral q morning   mometasone-formoterol  2 puff Inhalation BID   pantoprazole  40 mg Oral Daily   sodium chloride flush  3 mL Intravenous Q12H   sodium chloride, acetaminophen, albuterol, albuterol, ALPRAZolam, alum & mag hydroxide-simeth, calcium carbonate, dextromethorphan-guaiFENesin, diphenhydrAMINE, diphenhydrAMINE, hydrALAZINE, HYDROmorphone (DILAUDID) injection, lactulose, metoprolol tartrate, ondansetron (ZOFRAN) IV, ondansetron, oxyCODONE, phenol, polyethylene glycol, sodium chloride flush, traZODone  Assessment/ Plan:  Ms. AMAYA BLAKEMAN is a 50 y.o. black female with end stage renal disease on hemodialysis, diabetes mellitus type II, hypertension, congestive heart failure, COPD, who is admitted to [facility] on 10/15/2020 for Gangrene [facility])  [I96] Diabetic foot ulcer ([place]) [J00.938, L97.509] Decreased pedal pulses [R09.89] Sepsis, due to unspecified organism, unspecified whether acute organ dysfunction present [facility]) [A41.9]  [facility] Left AVF 127.5kg  End Stage Renal Disease: last dialysis was 10/14/20. Received dialysis yesterday. UF goal 2L removed. Tolerated well. Next treatment scheduled for Thursday.  Hypotension: secondary to infection. Holding home regimen torsemide, losartan, clonidine.121/85. Will monitor for need of Midodrine with HD treatments  Anemia with chronic kidney disease: hemoglobin 7.9. Low dose EPO with next HD treatment.   Secondary Hyperparathyroidism with hyeprphosphatemia: there is risk for calciphylaxis. Outpatinet labs on 8/9: phos 10.6, calcium 8.2, PTH 1456.  - cinacalcet and sevelamer with meals - Phosphorus at goal    Diabetes mellitus type II with chronic kidney disease: insulin dependent. Complicated with diabetic foot ulcer. History of poor control. Hemoglobin A1c 9.2% on 09/19/2020.  - Appreciate podiatry and vascular input.  - empiric ceftriaxone and metronidazole.  - Vascular performed right lower angiogram today. 30% mild lesion found popliteal artery. Deemed adequate for wound healing.  - Podiatry performed right foot, partial fifth toe amputation on 10/21/20. Daily dressing changes will determine if further intervention is needed.    6.    Hyponatremia: Current 126. Will perform dialysis tomorrow with sodium modeling to correct this value.    LOS: [place] 8/31/202212:28 PM

## 2020-10-25 NOTE — Progress Notes (Signed)
Initial Nutrition Assessment  DOCUMENTATION CODES:  Morbid obesity  INTERVENTION:  Carb modified diet to maximize intake and meal selection choices. Continue Nepro Shake po BID, each supplement provides 425 kcal and 19 grams protein Vitamin C 500mg  daily for wound healing and HD Renavite daily for HD needs  NUTRITION DIAGNOSIS:  Increased nutrient needs related to wound healing as evidenced by estimated needs.  GOAL:  Patient will meet greater than or equal to 90% of their needs  MONITOR:  PO intake, Supplement acceptance, Skin  REASON FOR ASSESSMENT:  LOS    ASSESSMENT:  50 y.o. female with past medical history including DM, ESRD on HD, CHF, diverticulosis, HTN, and COPD, presented to ED with fatigue, malaise, and worsening toe wound.  Vascular surgey consulted and recommended revascularizing the extremity. Underwent angiogram 8/24. Podiatry also consulted and discussed the necrotic appearance of the toe with pt and recommended amputation of the toe. Pt hesitant, but ultimately underwent amputation of the right 5th toe and I&D of the bottom of her foot 8/27. Bone margins from surgery positive for osteomyelitis.    Pt with other provider at first attempted visit and on bedpan at second attempt will defer assessment and physical exam to a later date. Reviewed intake hx and overall, intake appears poor with limited intake recorded. Pt has had her diet changed frequently after bing made NPO for procedures and kitchen alerted Rds of pt's frustrations at this. RD spoke to attending 8/26 and gave ok for carb modified diet as pt is on HD and minerals have been WNL. Adjusted back this AM.  Average Meal Intake: 8/22-8/31: 33% intake x 3 recorded meals (0-50%)  Nutritionally Relevant Medications: Scheduled Meds:  atorvastatin  80 mg Oral q1800   cholecalciferol  4,000 Units Oral Weekly   cinacalcet  60 mg Oral Daily   docusate sodium  100 mg Oral Daily   NEPRO CARB STEADY  237 mL Oral  BID BM   ferric citrate  210 mg Oral TID WC   insulin aspart  0-5 Units Subcutaneous QHS   insulin aspart  0-9 Units Subcutaneous TID WC   insulin aspart  2 Units Subcutaneous TID WC   insulin glargine-yfgn  36 Units Subcutaneous QHS   linaclotide  290 mcg Oral q morning   pantoprazole  40 mg Oral Daily   Continuous Infusions:  piperacillin-tazobactam (ZOSYN)  IV 2.25 g (10/25/20 0554)   PRN Meds: alum & mag hydroxide-simeth, calcium carbonate, diphenhydrAMINE, lactulose, ondansetron, phenol, polyethylene glycol  Labs Reviewed: Na 126, Chloride 87 BUN 51, creatinine 6.74 SBG ranges from 127-305 mg/dL over the last 24 hours HgbA1c 8.9% (8/21)  NUTRITION - FOCUSED PHYSICAL EXAM: Defer to in-person assessment  Diet Order:   Diet Order             Diet NPO time specified  Diet effective midnight           Diet Carb Modified Fluid consistency: Thin; Room service appropriate? Yes  Diet effective now                   EDUCATION NEEDS:  No education needs have been identified at this time  Skin:  Skin Assessment: Skin Integrity Issues: Skin Integrity Issues:: Incisions, Diabetic Ulcer Diabetic Ulcer: right heel Incisions: right foot, 5th toe  Last BM:  8/26  Height:  Ht Readings from Last 1 Encounters:  10/18/20 5' 4.5" (1.638 m)    Weight:  Wt Readings from Last 1 Encounters:  10/18/20 126.1  kg    Ideal Body Weight:  56.8 kg  BMI:  Body mass index is 46.98 kg/m.  Estimated Nutritional Needs:  Kcal:  2000-2200 kcal/d Protein:  100-120 g/d Fluid:  1L+UOP   Ranell Patrick, RD, LDN Clinical Dietitian Pager on [place]

## 2020-10-26 ENCOUNTER — Encounter
Admission: EM | Disposition: A | Discharge status: Home / Self Care | Payer: Self-pay | Source: Home / Self Care | Attending: Internal Medicine

## 2020-10-26 ENCOUNTER — Inpatient Hospital Stay: Payer: Medicare HMO | Admitting: Anesthesiology

## 2020-10-26 ENCOUNTER — Encounter: Payer: Self-pay | Admitting: Internal Medicine

## 2020-10-26 ENCOUNTER — Encounter: Payer: Self-pay | Admitting: Anesthesiology

## 2020-10-26 DIAGNOSIS — E11621 Type 2 diabetes mellitus with foot ulcer: Secondary | ICD-10-CM | POA: Diagnosis not present

## 2020-10-26 DIAGNOSIS — I96 Gangrene, not elsewhere classified: Secondary | ICD-10-CM

## 2020-10-26 DIAGNOSIS — Z992 Dependence on renal dialysis: Secondary | ICD-10-CM | POA: Diagnosis not present

## 2020-10-26 DIAGNOSIS — T17908S Unspecified foreign body in respiratory tract, part unspecified causing other injury, sequela: Secondary | ICD-10-CM | POA: Diagnosis not present

## 2020-10-26 DIAGNOSIS — N186 End stage renal disease: Secondary | ICD-10-CM | POA: Diagnosis not present

## 2020-10-26 DIAGNOSIS — E871 Hypo-osmolality and hyponatremia: Secondary | ICD-10-CM

## 2020-10-26 DIAGNOSIS — H9191 Unspecified hearing loss, right ear: Secondary | ICD-10-CM | POA: Diagnosis not present

## 2020-10-26 DIAGNOSIS — T8743 Infection of amputation stump, right lower extremity: Secondary | ICD-10-CM | POA: Diagnosis not present

## 2020-10-26 DIAGNOSIS — I739 Peripheral vascular disease, unspecified: Secondary | ICD-10-CM | POA: Diagnosis not present

## 2020-10-26 HISTORY — PX: INCISION AND DRAINAGE: SHX5863

## 2020-10-26 LAB — POCT I-STAT, CHEM 8
BUN: 36 mg/dL — ABNORMAL HIGH (ref 6–20)
Calcium, Ion: 0.81 mmol/L — CL (ref 1.15–1.40)
Chloride: 95 mmol/L — ABNORMAL LOW (ref 98–111)
Creatinine, Ser: 6.2 mg/dL — ABNORMAL HIGH (ref 0.44–1.00)
Glucose, Bld: 143 mg/dL — ABNORMAL HIGH (ref 70–99)
HCT: 29 % — ABNORMAL LOW (ref 36.0–46.0)
Hemoglobin: 9.9 g/dL — ABNORMAL LOW (ref 12.0–15.0)
Potassium: 4 mmol/L (ref 3.5–5.1)
Sodium: 130 mmol/L — ABNORMAL LOW (ref 135–145)
TCO2: 23 mmol/L (ref 22–32)

## 2020-10-26 LAB — GLUCOSE, CAPILLARY
Glucose-Capillary: 206 mg/dL — ABNORMAL HIGH (ref 70–99)
Glucose-Capillary: 117 mg/dL — ABNORMAL HIGH (ref 70–99)
Glucose-Capillary: 138 mg/dL — ABNORMAL HIGH (ref 70–99)
Glucose-Capillary: 283 mg/dL — ABNORMAL HIGH (ref 70–99)

## 2020-10-26 LAB — BASIC METABOLIC PANEL
Anion gap: 17 — ABNORMAL HIGH (ref 5–15)
BUN: 67 mg/dL — ABNORMAL HIGH (ref 6–20)
CO2: 21 mmol/L — ABNORMAL LOW (ref 22–32)
Calcium: 6.9 mg/dL — ABNORMAL LOW (ref 8.9–10.3)
Chloride: 86 mmol/L — ABNORMAL LOW (ref 98–111)
Creatinine, Ser: 8.01 mg/dL — ABNORMAL HIGH (ref 0.44–1.00)
GFR, Estimated: 6 mL/min — ABNORMAL LOW (ref >60)
Glucose, Bld: 257 mg/dL — ABNORMAL HIGH (ref 70–99)
Potassium: 4.4 mmol/L (ref 3.5–5.1)
Sodium: 124 mmol/L — ABNORMAL LOW (ref 135–145)

## 2020-10-26 LAB — MAGNESIUM: Magnesium: 2 mg/dL (ref 1.7–2.4)

## 2020-10-26 LAB — CBC
HCT: 26 % — ABNORMAL LOW (ref 36.0–46.0)
Hemoglobin: 8.4 g/dL — ABNORMAL LOW (ref 12.0–15.0)
MCH: 29.9 pg (ref 26.0–34.0)
MCHC: 32.3 g/dL (ref 30.0–36.0)
MCV: 92.5 fL (ref 80.0–100.0)
Platelets: 390 10*3/uL (ref 150–400)
RBC: 2.81 MIL/uL — ABNORMAL LOW (ref 3.87–5.11)
RDW: 15.3 % (ref 11.5–15.5)
WBC: 25.4 10*3/uL — ABNORMAL HIGH (ref 4.0–10.5)
nRBC: 0 % (ref 0.0–0.2)

## 2020-10-26 LAB — PROTIME-INR
INR: 1.3 — ABNORMAL HIGH (ref 0.8–1.2)
Prothrombin Time: 16.3 seconds — ABNORMAL HIGH (ref 11.4–15.2)

## 2020-10-26 LAB — TYPE AND SCREEN
ABO/RH(D): A POS
Antibody Screen: NEGATIVE

## 2020-10-26 LAB — APTT: aPTT: 41 seconds — ABNORMAL HIGH (ref 24–36)

## 2020-10-26 SURGERY — AMPUTATION BELOW KNEE
Anesthesia: Choice

## 2020-10-26 SURGERY — INCISION AND DRAINAGE
Anesthesia: General | Site: Foot | Laterality: Right

## 2020-10-26 MED ORDER — FENTANYL CITRATE (PF) 100 MCG/2ML IJ SOLN
25.0000 ug | INTRAMUSCULAR | Status: DC | PRN
  Administered 2020-10-26 (×3): 25 ug via INTRAVENOUS

## 2020-10-26 MED ORDER — INSULIN ASPART 100 UNIT/ML IJ SOLN
2.0000 [IU] | Freq: Three times a day (TID) | INTRAMUSCULAR | Status: DC
  Administered 2020-10-27 – 2020-11-03 (×22): 2 [IU] via SUBCUTANEOUS
  Filled 2020-10-26 (×23): qty 1

## 2020-10-26 MED ORDER — PROPOFOL 10 MG/ML IV BOLUS
INTRAVENOUS | Status: DC | PRN
  Administered 2020-10-26: 50 mg via INTRAVENOUS
  Administered 2020-10-26 (×2): 25 mg via INTRAVENOUS
  Administered 2020-10-26: 30 mg via INTRAVENOUS

## 2020-10-26 MED ORDER — POVIDONE-IODINE 10 % EX SOLN
CUTANEOUS | Status: DC | PRN
  Administered 2020-10-26: 1 via TOPICAL

## 2020-10-26 MED ORDER — PROMETHAZINE HCL 25 MG/ML IJ SOLN: 6.2500 mg | INTRAMUSCULAR | Status: DC | PRN

## 2020-10-26 MED ORDER — PHENYLEPHRINE HCL (PRESSORS) 10 MG/ML IV SOLN
INTRAVENOUS | Status: DC | PRN
  Administered 2020-10-26 (×2): 200 ug via INTRAVENOUS
  Administered 2020-10-26 (×2): 100 ug via INTRAVENOUS

## 2020-10-26 MED ORDER — 0.9 % SODIUM CHLORIDE (POUR BTL) OPTIME
TOPICAL | Status: DC | PRN
  Administered 2020-10-26: 200 mL

## 2020-10-26 MED ORDER — LIDOCAINE HCL 1 % IJ SOLN
INTRAMUSCULAR | Status: DC | PRN
  Administered 2020-10-26: 9 mL

## 2020-10-26 MED ORDER — BUPIVACAINE HCL (PF) 0.5 % IJ SOLN
INTRAMUSCULAR | Status: AC
  Filled 2020-10-26: qty 30

## 2020-10-26 MED ORDER — BUPIVACAINE HCL (PF) 0.25 % IJ SOLN
INTRAMUSCULAR | Status: AC
  Filled 2020-10-26: qty 30

## 2020-10-26 MED ORDER — DIPHENHYDRAMINE HCL 50 MG/ML IJ SOLN
INTRAMUSCULAR | Status: AC
  Filled 2020-10-26: qty 1

## 2020-10-26 MED ORDER — PROPOFOL 10 MG/ML IV BOLUS
INTRAVENOUS | Status: AC
  Filled 2020-10-26: qty 20

## 2020-10-26 MED ORDER — DIPHENHYDRAMINE HCL 50 MG/ML IJ SOLN
12.5000 mg | Freq: Once | INTRAMUSCULAR | Status: AC
  Administered 2020-10-26: 12.5 mg via INTRAVENOUS

## 2020-10-26 MED ORDER — LIDOCAINE-EPINEPHRINE 1 %-1:100000 IJ SOLN
INTRAMUSCULAR | Status: AC
  Filled 2020-10-26: qty 1

## 2020-10-26 MED ORDER — FENTANYL CITRATE (PF) 100 MCG/2ML IJ SOLN
INTRAMUSCULAR | Status: DC | PRN
  Administered 2020-10-26 (×2): 25 ug via INTRAVENOUS
  Administered 2020-10-26: 50 ug via INTRAVENOUS

## 2020-10-26 MED ORDER — LIDOCAINE HCL (PF) 2 % IJ SOLN
INTRAMUSCULAR | Status: AC
  Filled 2020-10-26: qty 5

## 2020-10-26 MED ORDER — FENTANYL CITRATE (PF) 100 MCG/2ML IJ SOLN
INTRAMUSCULAR | Status: AC
  Filled 2020-10-26: qty 2

## 2020-10-26 MED ORDER — BUPIVACAINE HCL 0.5 % IJ SOLN
INTRAMUSCULAR | Status: DC | PRN
  Administered 2020-10-26: 9 mL

## 2020-10-26 MED ORDER — LIDOCAINE HCL (PF) 1 % IJ SOLN
INTRAMUSCULAR | Status: AC
  Filled 2020-10-26: qty 30

## 2020-10-26 MED ORDER — MIDODRINE HCL 5 MG PO TABS: 20.0000 mg | ORAL_TABLET | Freq: Every day | ORAL | Status: DC | PRN

## 2020-10-26 MED ORDER — SODIUM CHLORIDE 0.9 % IR SOLN
Status: DC | PRN
  Administered 2020-10-26: 3000 mL

## 2020-10-26 MED ORDER — FENTANYL CITRATE (PF) 100 MCG/2ML IJ SOLN
INTRAMUSCULAR | Status: AC
  Administered 2020-10-26: 25 ug via INTRAVENOUS
  Filled 2020-10-26: qty 2

## 2020-10-26 MED ORDER — PROPOFOL 500 MG/50ML IV EMUL
INTRAVENOUS | Status: DC | PRN
  Administered 2020-10-26: 25 ug/kg/min via INTRAVENOUS

## 2020-10-26 SURGICAL SUPPLY — 43 items
APL PRP STRL LF DISP 70% ISPRP (MISCELLANEOUS) ×1
BLADE SAGITTAL WIDE XTHICK NO (BLADE) IMPLANT
BLADE SAW SAG 25.4X90 (BLADE) ×3 IMPLANT
BNDG CMPR STD VLCR NS LF 5.8X6 (GAUZE/BANDAGES/DRESSINGS) ×1
BNDG COHESIVE 4X5 TAN ST LF (GAUZE/BANDAGES/DRESSINGS) ×3 IMPLANT
BNDG ELASTIC 6X5.8 VLCR NS LF (GAUZE/BANDAGES/DRESSINGS) ×3 IMPLANT
BNDG GAUZE ELAST 4 BULKY (GAUZE/BANDAGES/DRESSINGS) ×6 IMPLANT
BRUSH SCRUB EZ  4% CHG (MISCELLANEOUS) ×1
BRUSH SCRUB EZ 4% CHG (MISCELLANEOUS) ×2 IMPLANT
CHLORAPREP W/TINT 26 (MISCELLANEOUS) ×3 IMPLANT
DRAPE INCISE IOBAN 66X45 STRL (DRAPES) IMPLANT
ELECT CAUTERY BLADE 6.4 (BLADE) ×3 IMPLANT
ELECT REM PT RETURN 9FT ADLT (ELECTROSURGICAL) ×2
ELECTRODE REM PT RTRN 9FT ADLT (ELECTROSURGICAL) ×2 IMPLANT
GAUZE 4X4 16PLY ~~LOC~~+RFID DBL (SPONGE) ×3 IMPLANT
GAUZE XEROFORM 1X8 LF (GAUZE/BANDAGES/DRESSINGS) ×6 IMPLANT
GLOVE SURG ENC MOIS LTX SZ7 (GLOVE) ×3 IMPLANT
GLOVE SURG SYN 7.0 (GLOVE) ×2 IMPLANT
GLOVE SURG SYN 7.0 PF PI (GLOVE) ×1 IMPLANT
GLOVE SURG UNDER LTX SZ7.5 (GLOVE) ×3 IMPLANT
GOWN STRL REUS W/ TWL LRG LVL3 (GOWN DISPOSABLE) ×2 IMPLANT
GOWN STRL REUS W/ TWL XL LVL3 (GOWN DISPOSABLE) ×4 IMPLANT
GOWN STRL REUS W/TWL LRG LVL3 (GOWN DISPOSABLE) ×2
GOWN STRL REUS W/TWL XL LVL3 (GOWN DISPOSABLE) ×4
HANDLE YANKAUER SUCT BULB TIP (MISCELLANEOUS) ×3 IMPLANT
KIT TURNOVER KIT A (KITS) ×3 IMPLANT
LABEL OR SOLS (LABEL) ×3 IMPLANT
MANIFOLD NEPTUNE II (INSTRUMENTS) ×3 IMPLANT
NS IRRIG 1000ML POUR BTL (IV SOLUTION) ×3 IMPLANT
PACK EXTREMITY ARMC (MISCELLANEOUS) ×3 IMPLANT
PAD ABD DERMACEA PRESS 5X9 (GAUZE/BANDAGES/DRESSINGS) ×6 IMPLANT
PAD PREP 24X41 OB/GYN DISP (PERSONAL CARE ITEMS) ×3 IMPLANT
SPONGE T-LAP 18X18 ~~LOC~~+RFID (SPONGE) ×3 IMPLANT
STAPLER SKIN PROX 35W (STAPLE) ×3 IMPLANT
STOCKINETTE M/LG 89821 (MISCELLANEOUS) ×3 IMPLANT
SUT SILK 2 0 (SUTURE) ×2
SUT SILK 2 0 SH (SUTURE) ×6 IMPLANT
SUT SILK 2-0 18XBRD TIE 12 (SUTURE) ×2 IMPLANT
SUT SILK 3 0 (SUTURE) ×2
SUT SILK 3-0 18XBRD TIE 12 (SUTURE) ×2 IMPLANT
SUT VIC AB 0 CT1 36 (SUTURE) ×6 IMPLANT
SUT VIC AB 2-0 CT1 (SUTURE) ×6 IMPLANT
WATER STERILE IRR 500ML POUR (IV SOLUTION) ×3 IMPLANT

## 2020-10-26 SURGICAL SUPPLY — 68 items
"PENCIL ELECTRO HAND CTR " (MISCELLANEOUS) ×1 IMPLANT
APL PRP STRL LF DISP 70% ISPRP (MISCELLANEOUS) ×1
BLADE OSC/SAGITTAL MD 5.5X18 (BLADE) IMPLANT
BLADE OSCILLATING/SAGITTAL (BLADE)
BLADE SURG 15 STRL LF DISP TIS (BLADE) ×1 IMPLANT
BLADE SURG 15 STRL SS (BLADE) ×2
BLADE SW THK.38XMED LNG THN (BLADE) IMPLANT
BNDG COHESIVE 4X5 TAN ST LF (GAUZE/BANDAGES/DRESSINGS) ×1 IMPLANT
BNDG COHESIVE 6X5 TAN ST LF (GAUZE/BANDAGES/DRESSINGS) ×2 IMPLANT
BNDG CONFORM 2 STRL LF (GAUZE/BANDAGES/DRESSINGS) ×1 IMPLANT
BNDG CONFORM 3 STRL LF (GAUZE/BANDAGES/DRESSINGS) ×1 IMPLANT
BNDG ELASTIC 4X5.8 VLCR STR LF (GAUZE/BANDAGES/DRESSINGS) ×2 IMPLANT
BNDG ESMARK 4X12 TAN STRL LF (GAUZE/BANDAGES/DRESSINGS) ×1 IMPLANT
BNDG GAUZE ELAST 4 BULKY (GAUZE/BANDAGES/DRESSINGS) ×2 IMPLANT
CANISTER WOUND CARE 500ML ATS (WOUND CARE) ×2 IMPLANT
CHLORAPREP W/TINT 26 (MISCELLANEOUS) ×1 IMPLANT
CUFF TOURN SGL QUICK 12 (TOURNIQUET CUFF) IMPLANT
CUFF TOURN SGL QUICK 18X4 (TOURNIQUET CUFF) IMPLANT
DRAPE FLUOR MINI C-ARM 54X84 (DRAPES) IMPLANT
DRAPE XRAY CASSETTE 23X24 (DRAPES) IMPLANT
DRSG MEPILEX FLEX 3X3 (GAUZE/BANDAGES/DRESSINGS) IMPLANT
DURAPREP 26ML APPLICATOR (WOUND CARE) ×1 IMPLANT
ELECT REM PT RETURN 9FT ADLT (ELECTROSURGICAL) ×2
ELECTRODE REM PT RTRN 9FT ADLT (ELECTROSURGICAL) ×1 IMPLANT
GAUZE 4X4 16PLY ~~LOC~~+RFID DBL (SPONGE) ×2 IMPLANT
GAUZE PACKING 1/4 X5 YD (GAUZE/BANDAGES/DRESSINGS) ×2 IMPLANT
GAUZE PACKING IODOFORM 1X5 (PACKING) ×1 IMPLANT
GAUZE PACKING IODOFORM 2 (PACKING) ×1 IMPLANT
GAUZE SPONGE 4X4 12PLY STRL (GAUZE/BANDAGES/DRESSINGS) ×2 IMPLANT
GAUZE XEROFORM 1X8 LF (GAUZE/BANDAGES/DRESSINGS) ×1 IMPLANT
GLOVE SURG ENC MOIS LTX SZ7.5 (GLOVE) ×2 IMPLANT
GLOVE SURG UNDER LTX SZ8 (GLOVE) ×2 IMPLANT
GOWN STRL REUS W/ TWL XL LVL3 (GOWN DISPOSABLE) ×2 IMPLANT
GOWN STRL REUS W/TWL MED LVL3 (GOWN DISPOSABLE) ×2 IMPLANT
GOWN STRL REUS W/TWL XL LVL3 (GOWN DISPOSABLE) ×4
HANDPIECE VERSAJET DEBRIDEMENT (MISCELLANEOUS) IMPLANT
IV NS 1000ML (IV SOLUTION) ×2
IV NS 1000ML BAXH (IV SOLUTION) ×1 IMPLANT
IV NS IRRIG 3000ML ARTHROMATIC (IV SOLUTION) ×2 IMPLANT
KIT DRSG VAC SLVR GRANUFM (MISCELLANEOUS) ×1 IMPLANT
KIT TURNOVER KIT A (KITS) ×2 IMPLANT
LABEL OR SOLS (LABEL) ×1 IMPLANT
MANIFOLD NEPTUNE II (INSTRUMENTS) ×2 IMPLANT
NDL FILTER BLUNT 18X1 1/2 (NEEDLE) ×1 IMPLANT
NDL HYPO 25X1 1.5 SAFETY (NEEDLE) ×1 IMPLANT
NEEDLE FILTER BLUNT 18X 1/2SAF (NEEDLE)
NEEDLE FILTER BLUNT 18X1 1/2 (NEEDLE) IMPLANT
NEEDLE HYPO 25X1 1.5 SAFETY (NEEDLE) ×2 IMPLANT
NS IRRIG 500ML POUR BTL (IV SOLUTION) ×2 IMPLANT
PACK EXTREMITY ARMC (MISCELLANEOUS) ×2 IMPLANT
PAD ABD DERMACEA PRESS 5X9 (GAUZE/BANDAGES/DRESSINGS) ×1 IMPLANT
PENCIL ELECTRO HAND CTR (MISCELLANEOUS) ×2 IMPLANT
PULSAVAC PLUS IRRIG FAN TIP (DISPOSABLE) ×2
RASP SM TEAR CROSS CUT (RASP) IMPLANT
SHIELD FULL FACE ANTIFOG 7M (MISCELLANEOUS) ×1 IMPLANT
STOCKINETTE IMPERVIOUS 9X36 MD (GAUZE/BANDAGES/DRESSINGS) ×2 IMPLANT
SUT ETHILON 2 0 FS 18 (SUTURE) ×3 IMPLANT
SUT ETHILON 4-0 (SUTURE)
SUT ETHILON 4-0 FS2 18XMFL BLK (SUTURE)
SUT VIC AB 3-0 SH 27 (SUTURE)
SUT VIC AB 3-0 SH 27X BRD (SUTURE) ×1 IMPLANT
SUT VIC AB 4-0 FS2 27 (SUTURE) ×1 IMPLANT
SUTURE ETHLN 4-0 FS2 18XMF BLK (SUTURE) ×1 IMPLANT
SWAB CULTURE AMIES ANAERIB BLU (MISCELLANEOUS) IMPLANT
SYR 10ML LL (SYRINGE) ×2 IMPLANT
SYR 3ML LL SCALE MARK (SYRINGE) ×1 IMPLANT
TIP FAN IRRIG PULSAVAC PLUS (DISPOSABLE) ×1 IMPLANT
WATER STERILE IRR 500ML POUR (IV SOLUTION) ×2 IMPLANT

## 2020-10-26 NOTE — Progress Notes (Signed)
Pt. with LUA AVF difficulty cannulation, clot, restick, was able to maintain BFR. Pt terminated tx early citing GI discomfort. Fld removed 0525. VSS, afebrile. Pt maintained NPO status, returned to room.

## 2020-10-26 NOTE — Op Note (Signed)
Surgeon: Surgeon(s): [name], DPM  Assistants: None Pre-operative diagnosis: Washout of Debridement of Right Foot  Post-operative diagnosis: same Procedure: 1.  Incision and drainage with revisional debridement Pathology:  ID Type Source Tests Collected by Time Destination  A : Right foot; post lavage Tissue PATH Other AEROBIC/ANAEROBIC CULTURE W GRAM STAIN (SURGICAL/DEEP WOUND) [name], DPM 10/26/2020 1735     Pertinent Intra-op findings: Further purulent drainage was noted tracking across the plantar foot as well as dorsally.  All purulent drainage was exsanguinated* Anesthesia: Choice  Hemostasis: * No tourniquets in log * EBL: 100 cc Materials: Iodoform packing Injectables: None Complications: None  Indications for surgery: A 50 y.o. female presents with right foot abscess. Patient has failed all conservative therapy including but not limited to local wound care and IV antibiotics. She wishes to have surgical correction of the foot/deformity. It was determined that patient would benefit from right foot incision drainage revisional with further debridement and decompression. Informed surgical risk consent was reviewed and read aloud to the patient.  I reviewed the films.  I have discussed my findings with the patient in great detail.  I have discussed all risks including but not limited to infection, stiffness, scarring, limp, disability, deformity, damage to blood vessels and nerves, numbness, poor healing, need for braces, arthritis, chronic pain, amputation, death.  All benefits and realistic expectations discussed in great detail.  I have made no promises as to the outcome.  I have provided realistic expectations.  I have offered the patient a 2nd opinion, which they have declined and assured me they preferred to proceed despite the risks   Procedure in detail: The patient was both verbally and visually identified by myself, the nursing staff, and anesthesia staff in the  preoperative holding area. They were then transferred to the operating room and placed on the operative table in supine position.  Attention was directed to the right foot at the previous incision.  The plantar foot was reinspected using #15 blade incision was carried down to deep tissue using hemostat and mosquito all planes/tissue planes were decompressed further purulence was noted.  Continued decompression was achieved via mechanical decompression.  The distal partial fifth ray amputation dorsal area was also decompressed thoroughly.  At this time all purulent drainage was noted to be decompressed.  Using 3 L normal saline solution and pulse lavage the left incision site was thoroughly irrigated in standard technique.  No purulent drainage noted.  Debridement was further carried out with rongeur.  At this time the wound was packed with 1 inch iodoform packing in standard technique.  The foot was dressed with Betadine soaked gauze Kerlix Kling Ace bandage.  All bony prominences were adequately padded.  At the conclusion of the procedure the patient was awoken from anesthesia and found to have tolerated the procedure well any complications. There were transferred to PACU with vital signs stable and vascular status intact.  Boneta Lucks, DPM

## 2020-10-26 NOTE — Interval H&P Note (Signed)
History and Physical Interval Note:  10/26/2020 4:53 PM  [name] [name]  has presented today for surgery, with the diagnosis of Washout of Debridement of Right Leg.  The various methods of treatment have been discussed with the patient and family. After consideration of risks, benefits and other options for treatment, the patient has consented to  Procedure(s): INCISION AND DRAINAGE (Right) Revisional as a surgical intervention.  The patient's history has been reviewed, patient examined, no change in status, stable for surgery.  I have reviewed the patient's chart and labs.  Questions were answered to the patient's satisfaction.     [name]

## 2020-10-26 NOTE — Progress Notes (Signed)
Patient ID: [name], female   DOB: Apr 25, 1970, 50 y.o.   MRN: [id number] [facility] PROGRESS NOTE  [name] QIW:[id number] DOB: 1970/09/12 DOA: 10/15/2020 PCP: [name], NP  HPI/Subjective: Patient seen this morning.  She wants to wait on making any decision for amputation or further cutting at this point but of agreeable to a cleanout procedure on her foot.  She will let me know if she is interested in second opinion.  Patient states that she has had pain in the foot going on for a long time.  On July 4 she noticed a hole.  Before coming into the hospital she noticed some pus coming from the area.  Objective: Vitals:   10/26/20 1245 10/26/20 1352  BP: (!) 70/46 111/61  Pulse: 91 90  Resp:  20  Temp:  98.3 F (36.8 C)  SpO2:  99%    Intake/Output Summary (Last 24 hours) at 10/26/2020 1418 Last data filed at 10/26/2020 1245 Gross per 24 hour  Intake 120 ml  Output 1739 ml  Net -1619 ml   Filed Weights   10/15/20 1905 10/18/20 0928  Weight: 126.1 kg 126.1 kg    ROS: Review of Systems  Respiratory:  Negative for shortness of breath.   Cardiovascular:  Negative for chest pain.  Gastrointestinal:  Negative for abdominal pain.  Musculoskeletal:  Positive for joint pain.  Exam: Physical Exam HENT:     Head: Normocephalic.     Mouth/Throat:     Pharynx: No oropharyngeal exudate.  Eyes:     General: Lids are normal.     Conjunctiva/sclera: Conjunctivae normal.  Cardiovascular:     Rate and Rhythm: Normal rate and regular rhythm.     Heart sounds: Normal heart sounds, S1 normal and S2 normal.  Pulmonary:     Breath sounds: No decreased breath sounds, wheezing, rhonchi or rales.  Abdominal:     Palpations: Abdomen is soft.     Tenderness: There is no abdominal tenderness.  Musculoskeletal:     Right lower leg: No swelling.     Left lower leg: No swelling.  Skin:    General: Skin is warm.     Comments: Right foot covered.  Neurological:      Mental Status: She is alert and oriented to person, place, and time.      Scheduled Meds:  vitamin C  500 mg Oral Daily   aspirin EC  81 mg Oral Daily   Chlorhexidine Gluconate Cloth  6 each Topical Daily   cholecalciferol  4,000 Units Oral Weekly   cinacalcet  60 mg Oral Daily   docusate sodium  100 mg Oral Daily   feeding supplement (NEPRO CARB STEADY)  237 mL Oral BID BM   ferric citrate  210 mg Oral TID WC   heparin  5,000 Units Subcutaneous Q8H   insulin aspart  0-5 Units Subcutaneous QHS   insulin aspart  0-9 Units Subcutaneous TID WC   insulin aspart  2 Units Subcutaneous TID WC   insulin glargine-yfgn  36 Units Subcutaneous QHS   ipratropium-albuterol  3 mL Nebulization TID   linaclotide  290 mcg Oral q morning   loratadine  10 mg Oral Daily   mometasone-formoterol  2 puff Inhalation BID   multivitamin  1 tablet Oral QHS   pantoprazole  40 mg Oral Daily   sodium chloride flush  3 mL Intravenous Q12H   Continuous Infusions:  sodium chloride Stopped (10/22/20 1200)   diphenhydrAMINE  piperacillin-tazobactam (ZOSYN)  IV 2.25 g (10/26/20 0541)    Assessment/Plan:  Diabetic foot ulcers with gangrene of the fifth toe and osteomyelitis.  Patient had an amputation of the fifth ray and I&D of the right foot on 827.  Patient has declined any further amputation right at this point.  She wants to speak with some more people and will get back to me on whether she wants a second opinion or not.  She is agreeable to a cleanout procedure this afternoon.  Vascular surgery and podiatry recommending amputation which the patient is not agreeable to at this point.  Patient currently on IV Zosyn Enterococcus and Klebsiella growing out of culture.  Also has diphtheroids growing out of culture corynebacterium species.  White blood cell count still very elevated at 25.4. Peripheral vascular disease.  Angiogram on 10/18/2020 showed popliteal artery mild lesion 30% or less.  On aspirin. Decreased  hearing.  Continue Claritin.  Tympanic membrane showed slight erythema yesterday.  Zosyn would cover if infection. Type 2 diabetes mellitus with end-stage renal disease.  Hemodialysis today needed to be cut short a little bit secondary to hypotension.  Patient on glargine insulin and sliding scale. Anemia of chronic disease of last hemoglobin 8.4. Hyponatremia.  Managed with hemodialysis Chronic hypoxic respiratory failure with hypoxia wears 3 to 4 L of oxygen at night up.  On Dulera inhaler. Morbid obesity with a BMI of 46.9        Code Status:     Code Status Orders  (From admission, onward)           Start     Ordered   10/15/20 2114  Full code  Continuous        10/15/20 2119           Code Status History     Date Active Date Inactive Code Status Order ID Comments User Context   03/24/2017 1953 04/28/2017 1906 Full Code [id number]  Hillary Bow, MD ED   03/03/2017 1420 03/10/2017 2131 Full Code [id number]  Fritzi Mandes, MD ED   01/07/2017 1725 01/10/2017 1900 Full Code [id number]  Demetrios Loll, MD Inpatient   11/08/2016 0450 11/16/2016 1717 Full Code [id number]  Hugelmeyer, Alexis, DO Inpatient   06/25/2016 1447 06/29/2016 1815 Full Code [id number]  Hillary Bow, MD ED   08/09/2015 1757 08/13/2015 1721 Full Code [id number]  Bettey Costa, MD Inpatient   12/31/2014 2025 01/03/2015 1749 Full Code [id number]  Hower, Aaron Mose, MD ED   12/15/2014 1154 12/19/2014 1657 Full Code [id number]  Hillary Bow, MD ED      Family Communication: Left message for husband Disposition Plan: Status is: Inpatient  Dispo: The patient is from: Home              Anticipated d/c is to: Home              Patient currently going to the operating room this evening for a cleanout procedure.  Patient has not given permission for amputation at this point.   Difficult to place patient.  No.  Consultants: Infectious disease Vascular surgery Podiatry Nephrology  Antibiotics: Zosyn  Time spent: 27 minutes,  case discussed with vascular and podiatry.  [place]  [facility] [employer]

## 2020-10-26 NOTE — Progress Notes (Addendum)
Ice chips given per verbal from Dr. Leslye Peer. MD states to also give medicine.

## 2020-10-26 NOTE — Progress Notes (Signed)
[facility]  ROUNDING NOTE   Subjective:   [name] was admitted to [facility] on 10/15/2020 for Gangrene [facility]) [I96] Diabetic foot ulcer ([place]) [E11.621, L97.509] Decreased pedal pulses [R09.89] Sepsis, due to unspecified organism, unspecified whether acute organ dysfunction present [facility]) [A41.9]  Last hemodialysis treatment was Saturday, 8/20. She was having hypotension on her treatment and she took midodrine to complete her treatment.   Patient seen during dialysis   HEMODIALYSIS FLOWSHEET:  Blood Flow Rate (mL/min): 400 mL/min Arterial Pressure (mmHg): -180 mmHg Venous Pressure (mmHg): 190 mmHg Transmembrane Pressure (mmHg): 70 mmHg Ultrafiltration Rate (mL/min): 1170 mL/min Dialysate Flow Rate (mL/min): 500 ml/min Conductivity: Machine : 14.4 Conductivity: Machine : 14.4 Dialysis Fluid Bolus: Normal Saline Bolus Amount (mL): 200 mL  Voices concerns about recent surgery and management afterwards No other complaints at this time  Objective:  Vital signs in last 24 hours:  Temp:  [97.9 F (36.6 C)-99.1 F (37.3 C)] 98.3 F (36.8 C) (09/01 1352) Pulse Rate:  [85-93] 90 (09/01 1352) Resp:  [12-20] 20 (09/01 1352) BP: (43-134)/(31-106) 111/61 (09/01 1352) SpO2:  [97 %-100 %] 99 % (09/01 1352)  Weight change:  Filed Weights   10/15/20 1905 10/18/20 0928  Weight: 126.1 kg 126.1 kg    Intake/Output: I/O last 3 completed shifts: In: 120 [P.O.:120] Out: -    Intake/Output this shift:  Total I/O In: -  Out: 1739 [Other:1739]  Physical Exam: General: NAD, laying in bed  Head: Normocephalic, atraumatic. Moist oral mucosal membranes  Eyes: Anicteric  Lungs:  Clear to auscultation, O2 2L  Heart: Regular rate and rhythm  Abdomen:  Soft, nontender  Extremities:  trace peripheral edema. Right foot fifth toe amputation on 10/21/20, ace wrapped  Neurologic: Nonfocal, moving all four extremities  Skin: No lesions  Access: Left AVF    Basic  Metabolic Panel: Recent Labs  Lab 10/20/20 0757 10/21/20 0104 10/21/20 5361 10/22/20 0523 10/23/20 0456 10/24/20 0527 10/25/20 0410 10/26/20 0439  NA 126*  --  126* 128* 126* 123* 126* 124*  K 4.4  --  5.0 4.8 4.5 5.6* 3.9 4.4  CL 85*  --  87* 87* 88* 85* 87* 86*  CO2 25  --  23 26 23  18* 23 21*  GLUCOSE 239*  --  126* 215* 131* 198* 199* 257*  BUN 45*  --  57* 41* 53* 68* 51* 67*  CREATININE 6.99*  --  8.04* 6.01* 7.09* 8.43* 6.74* 8.01*  CALCIUM 7.8*  --  7.8* 7.7* 7.3* 7.0* 7.0* 6.9*  MG 1.9 2.0 2.1  --   --   --   --  2.0  PHOS 4.5  --   --   --   --   --   --   --      Liver Function Tests: Recent Labs  Lab 10/22/20 0523 10/23/20 0456 10/24/20 0527 10/25/20 0410  AST 60* 88* 346* 561*  ALT 22 31 91* 167*  ALKPHOS 477* 474* 661* 661*  BILITOT 1.0 1.1 1.2 1.2  PROT 6.7 6.8 7.0 6.8  ALBUMIN 2.4* 2.4* 2.5* 2.3*    No results for input(s): LIPASE, AMYLASE in the last 168 hours. No results for input(s): AMMONIA in the last 168 hours.  CBC: Recent Labs  Lab 10/22/20 0523 10/22/20 2242 10/23/20 0456 10/24/20 0527 10/25/20 0410 10/26/20 0439  WBC 31.3*  --  30.7* 28.2* 25.9* 25.4*  HGB 6.8* 8.3* 8.2* 8.1* 7.9* 8.4*  HCT 21.0* 24.7* 24.7* 24.2* 24.1*  26.0*  MCV 95.5  --  91.8 94.9 94.5 92.5  PLT 376  --  400 398 389 390     Cardiac Enzymes: No results for input(s): CKTOTAL, CKMB, CKMBINDEX, TROPONINI in the last 168 hours.  BNP: Invalid input(s): POCBNP  CBG: Recent Labs  Lab 10/25/20 1209 10/25/20 1620 10/25/20 2119 10/26/20 0810 10/26/20 1358  GLUCAP 146* 154* 165* 206* 117*     Microbiology: Results for orders placed or performed during the hospital encounter of 10/15/20  Blood Culture (routine x 2)     Status: None   Collection Time: 10/15/20  8:03 PM   Specimen: BLOOD  Result Value Ref Range Status   Specimen Description BLOOD BLOOD RIGHT FOREARM  Final   Special Requests   Final    BOTTLES DRAWN AEROBIC AND ANAEROBIC Blood Culture  adequate volume   Culture   Final    NO GROWTH 5 DAYS Performed at [facility], [address] [place], [place] [address]    Report Status 10/20/2020 FINAL  Final  Resp Panel by RT-PCR (Flu A&B, Covid) Nasopharyngeal Swab     Status: None   Collection Time: 10/15/20  8:26 PM   Specimen: Nasopharyngeal Swab; Nasopharyngeal(NP) swabs in vial transport medium  Result Value Ref Range Status   SARS Coronavirus 2 by RT PCR NEGATIVE NEGATIVE Final    Comment: (NOTE) SARS-CoV-2 target nucleic acids are NOT DETECTED.  The SARS-CoV-2 RNA is generally detectable in upper respiratory specimens during the acute phase of infection. The lowest concentration of SARS-CoV-2 viral copies this assay can detect is 138 copies/mL. A negative result does not preclude SARS-Cov-2 infection and should not be used as the sole basis for treatment or other patient management decisions. A negative result may occur with  improper specimen collection/handling, submission of specimen other than nasopharyngeal swab, presence of viral mutation(s) within the areas targeted by this assay, and inadequate number of viral copies(<138 copies/mL). A negative result must be combined with clinical observations, patient history, and epidemiological information. The expected result is Negative.  Fact Sheet for Patients:  EntrepreneurPulse.com.au  Fact Sheet for Healthcare Providers:  IncredibleEmployment.be  This test is no t yet approved or cleared by the [place] FDA and  has been authorized for detection and/or diagnosis of SARS-CoV-2 by FDA under an Emergency Use Authorization (EUA). This EUA will remain  in effect (meaning this test can be used) for the duration of the COVID-19 declaration under Section 564(b)(1) of the Act, 21 U.S.C.section 360bbb-3(b)(1), unless the authorization is terminated  or revoked sooner.       Influenza A by PCR NEGATIVE NEGATIVE Final    Influenza B by PCR NEGATIVE NEGATIVE Final    Comment: (NOTE) The Xpert Xpress SARS-CoV-2/FLU/RSV plus assay is intended as an aid in the diagnosis of influenza from Nasopharyngeal swab specimens and should not be used as a sole basis for treatment. Nasal washings and aspirates are unacceptable for Xpert Xpress SARS-CoV-2/FLU/RSV testing.  Fact Sheet for Patients: EntrepreneurPulse.com.au  Fact Sheet for Healthcare Providers: IncredibleEmployment.be  This test is not yet approved or cleared by the [place] FDA and has been authorized for detection and/or diagnosis of SARS-CoV-2 by FDA under an Emergency Use Authorization (EUA). This EUA will remain in effect (meaning this test can be used) for the duration of the COVID-19 declaration under Section 564(b)(1) of the Act, 21 U.S.C. section 360bbb-3(b)(1), unless the authorization is terminated or revoked.  Performed at [facility] [place],  [place], [place] [address]   Blood Culture (routine x 2)     Status: None   Collection Time: 10/15/20  8:26 PM   Specimen: BLOOD  Result Value Ref Range Status   Specimen Description BLOOD RIGHT ANTECUBITAL  Final   Special Requests   Final    BOTTLES DRAWN AEROBIC AND ANAEROBIC Blood Culture adequate volume   Culture   Final    NO GROWTH 5 DAYS Performed at [facility], [address] [place], [place] [address]    Report Status 10/20/2020 FINAL  Final  Aerobic Culture w Gram Stain (superficial specimen)     Status: None   Collection Time: 10/17/20  8:52 PM   Specimen: Wound  Result Value Ref Range Status   Specimen Description WOUND  Final   Special Requests NONE  Final   Gram Stain   Final    RARE SQUAMOUS EPITHELIAL CELLS PRESENT FEW WBC PRESENT, PREDOMINANTLY MONONUCLEAR ABUNDANT GRAM VARIABLE ROD FEW GRAM POSITIVE COCCI    Culture   Final    MODERATE ENTEROCOCCUS FAECALIS MODERATE KLEBSIELLA  OXYTOCA ABUNDANT DIPHTHEROIDS(CORYNEBACTERIUM SPECIES) Standardized susceptibility testing for this organism is not available.    Report Status 10/20/2020 FINAL  Final   Organism ID, Bacteria ENTEROCOCCUS FAECALIS  Final   Organism ID, Bacteria KLEBSIELLA OXYTOCA  Final      Susceptibility   Enterococcus faecalis - MIC*    AMPICILLIN <=2 SENSITIVE Sensitive     VANCOMYCIN 1 SENSITIVE Sensitive     GENTAMICIN SYNERGY RESISTANT Resistant     * MODERATE ENTEROCOCCUS FAECALIS   Klebsiella oxytoca - MIC*    AMPICILLIN >=32 RESISTANT Resistant     CEFAZOLIN <=4 SENSITIVE Sensitive     CEFEPIME <=0.12 SENSITIVE Sensitive     CEFTAZIDIME <=1 SENSITIVE Sensitive     CEFTRIAXONE <=0.25 SENSITIVE Sensitive     CIPROFLOXACIN <=0.25 SENSITIVE Sensitive     GENTAMICIN <=1 SENSITIVE Sensitive     IMIPENEM <=0.25 SENSITIVE Sensitive     TRIMETH/SULFA <=20 SENSITIVE Sensitive     AMPICILLIN/SULBACTAM 8 SENSITIVE Sensitive     PIP/TAZO <=4 SENSITIVE Sensitive     * MODERATE KLEBSIELLA OXYTOCA  Aerobic/Anaerobic Culture w Gram Stain (surgical/deep wound)     Status: None (Preliminary result)   Collection Time: 10/21/20  3:07 PM   Specimen: PATH Digit amputation; Tissue  Result Value Ref Range Status   Specimen Description   Final    FOOT  RIGHT 5TH DIGIT AMPUTATION Performed at [facility], [address], [place], [place] [address]    Special Requests   Final    NONE Performed at [facility], [place], [place] [address]    Gram Stain   Final    FEW SQUAMOUS EPITHELIAL CELLS PRESENT FEW WBC SEEN FEW GRAM POSITIVE COCCI Performed at [facility], [place] [address], [place], [place] [address]    Culture   Final    FEW STREPTOCOCCUS ANGINOSIS NO ANAEROBES ISOLATED; CULTURE IN PROGRESS FOR 5 DAYS    Report Status PENDING  Incomplete   Organism ID, Bacteria STREPTOCOCCUS ANGINOSIS  Final      Susceptibility   Streptococcus anginosis - MIC*     PENICILLIN 0.12 SENSITIVE Sensitive     CEFTRIAXONE 0.5 SENSITIVE Sensitive     ERYTHROMYCIN 4 RESISTANT Resistant     LEVOFLOXACIN 0.5 SENSITIVE Sensitive     VANCOMYCIN 0.5 SENSITIVE Sensitive     * FEW STREPTOCOCCUS ANGINOSIS    Coagulation Studies: Recent  Labs    10/26/20 0439  LABPROT 16.3*  INR 1.3*     Urinalysis: No results for input(s): COLORURINE, LABSPEC, PHURINE, GLUCOSEU, HGBUR, BILIRUBINUR, KETONESUR, PROTEINUR, UROBILINOGEN, NITRITE, LEUKOCYTESUR in the last 72 hours.  Invalid input(s): APPERANCEUR    Imaging: No results found.   Medications:    sodium chloride Stopped (10/22/20 1200)   diphenhydrAMINE     piperacillin-tazobactam (ZOSYN)  IV 2.25 g (10/26/20 0541)    vitamin C  500 mg Oral Daily   aspirin EC  81 mg Oral Daily   Chlorhexidine Gluconate Cloth  6 each Topical Daily   cholecalciferol  4,000 Units Oral Weekly   cinacalcet  60 mg Oral Daily   docusate sodium  100 mg Oral Daily   feeding supplement (NEPRO CARB STEADY)  237 mL Oral BID BM   ferric citrate  210 mg Oral TID WC   heparin  5,000 Units Subcutaneous Q8H   insulin aspart  0-5 Units Subcutaneous QHS   insulin aspart  0-9 Units Subcutaneous TID WC   insulin aspart  2 Units Subcutaneous TID WC   insulin glargine-yfgn  36 Units Subcutaneous QHS   ipratropium-albuterol  3 mL Nebulization TID   linaclotide  290 mcg Oral q morning   loratadine  10 mg Oral Daily   mometasone-formoterol  2 puff Inhalation BID   multivitamin  1 tablet Oral QHS   pantoprazole  40 mg Oral Daily   sodium chloride flush  3 mL Intravenous Q12H   sodium chloride, acetaminophen, albuterol, albuterol, ALPRAZolam, alum & mag hydroxide-simeth, calcium carbonate, dextromethorphan-guaiFENesin, diphenhydrAMINE, diphenhydrAMINE, hydrALAZINE, HYDROmorphone (DILAUDID) injection, lactulose, metoprolol tartrate, ondansetron (ZOFRAN) IV, ondansetron, oxyCODONE, phenol, polyethylene glycol, sodium chloride flush,  traZODone  Assessment/ Plan:  [name] is a 50 y.o. black female with end stage renal disease on hemodialysis, diabetes mellitus type II, hypertension, congestive heart failure, COPD, who is admitted to [facility] on 10/15/2020 for Gangrene [facility]) [I96] Diabetic foot ulcer ([place]) [R42.706, L97.509] Decreased pedal pulses [R09.89] Sepsis, due to unspecified organism, unspecified whether acute organ dysfunction present [facility]) [A41.9]  [facility] Left AVF 127.5kg  End Stage Renal Disease: last dialysis was 10/14/20. Received dialysis today. UF goal 1.7L removed. Sodium modeling used to correct sodium levels. Requested to be taken off treatment early due to bathroom need. Next treatment scheduled for Saturday.  Hypotension: secondary to infection. Holding home regimen torsemide, losartan, clonidine.111/61. Low BP during dialysis, protocols followed.   Anemia with chronic kidney disease: hemoglobin 8.4. Low dose EPO with next HD treatment.   Secondary Hyperparathyroidism with hyeprphosphatemia: there is risk for calciphylaxis. Outpatinet labs on 8/9: phos 10.6, calcium 8.2, PTH 1456.  - cinacalcet and sevelamer with meals - Phosphorus at goal    Diabetes mellitus type II with chronic kidney disease: insulin dependent. Complicated with diabetic foot ulcer. History of poor control. Hemoglobin A1c 9.2% on 09/19/2020.  - Appreciate podiatry and vascular input.  - empiric ceftriaxone and metronidazole.  - Vascular performed right lower angiogram today. 30% mild lesion found popliteal artery. Deemed adequate for wound healing.  - Podiatry performed right foot, partial fifth toe amputation on 10/21/20. Daily dressing changes will determine if further intervention is needed.    6.    Hyponatremia: Decreased to 124. Sodium modeling during dialysis to correct this.   LOS: [place] 9/1/20222:12 PM

## 2020-10-26 NOTE — Anesthesia Postprocedure Evaluation (Signed)
Anesthesia Post Note  Patient: [name]  Procedure(s) Performed: INCISION AND DRAINAGE (Right: Foot)  Patient location during evaluation: PACU Anesthesia Type: General Level of consciousness: awake and alert Pain management: pain level controlled Vital Signs Assessment: post-procedure vital signs reviewed and stable Respiratory status: spontaneous breathing, nonlabored ventilation, respiratory function stable and patient connected to nasal cannula oxygen Cardiovascular status: blood pressure returned to baseline and stable Postop Assessment: no apparent nausea or vomiting Anesthetic complications: no   No notable events documented.   Last Vitals:  Vitals:   10/26/20 1826 10/26/20 1842  BP: 117/74 134/78  Pulse: 93 89  Resp: 17 16  Temp: 36.6 C 36.6 C  SpO2: 96% 98%    Last Pain:  Vitals:   10/26/20 1842  TempSrc: Oral  PainSc: 6                  Martha Clan

## 2020-10-26 NOTE — Transfer of Care (Signed)
Immediate Anesthesia Transfer of Care Note  Patient: [name]  Procedure(s) Performed: INCISION AND DRAINAGE (Right: Foot)  Patient Location: PACU  Anesthesia Type:General  Level of Consciousness: awake and alert   Airway & Oxygen Therapy: Patient Spontanous Breathing and Patient connected to nasal cannula oxygen  Post-op Assessment: Report given to RN and Post -op Vital signs reviewed and stable  Post vital signs: Reviewed  Last Vitals:  Vitals Value Taken Time  BP    Temp    Pulse    Resp    SpO2      Last Pain:  5    Patients Stated Pain Goal: 4 (17/00/17 4944)  Complications: No notable events documented.

## 2020-10-26 NOTE — Anesthesia Preprocedure Evaluation (Signed)
Anesthesia Evaluation  Patient identified by MRN, date of birth, ID band Patient awake  General Assessment Comment:Patient appears drowsy, says she took xanax recently. Appears anxious about procedure and anesthetic.  Reviewed: Allergy & Precautions, NPO status , [name] Chart, lab work & pertinent test results  History of Anesthesia Complications Negative for: history of anesthetic complications  Airway Mallampati: II  TM Distance: >3 FB Neck ROM: Full    Dental no notable dental hx. (+) Teeth Intact, Dental Advidsory Given   Pulmonary neg shortness of breath, asthma , neg sleep apnea, COPD,  oxygen dependent, neg recent URI, Patient abstained from smoking.Not current smoker,  On 3-4L/min O2 Denies OSA    + decreased breath sounds      Cardiovascular Exercise Tolerance: Poor METShypertension, Pt. on medications (-) angina+ Peripheral Vascular Disease, +CHF and + Orthopnea  (-) CAD and (-) Past MI (-) dysrhythmias  Rhythm:Regular Rate:Normal - Systolic murmurs TTE 4174: Study Conclusions   - Left ventricle: The cavity size was mildly dilated. Systolic  function was normal. The estimated ejection fraction was 55%.  Wall motion was normal; there were no regional wall motion  abnormalities. Doppler parameters are consistent with abnormal  left ventricular relaxation (grade 1 diastolic dysfunction).  - Mitral valve: There was mild regurgitation.  - Atrial septum: No defect or patent foramen ovale was identified.    Neuro/Psych PSYCHIATRIC DISORDERS Anxiety Depression negative neurological ROS     GI/Hepatic neg GERD  ,(+)     (-) substance abuse  ,   Endo/Other  diabetes, Poorly Controlled, Insulin DependentMorbid obesity  Renal/GU ESRF and DialysisRenal diseaseLast dialyzed this morning     Musculoskeletal   Abdominal (+) + obese,   Peds  Hematology   Anesthesia Other Findings Past Medical History: No  date: (HFpEF) heart failure with preserved ejection fraction ([place])     Comment:  a. 11/2014 Echo: EF 50-55%, Gr1 DD; b. 06/2016 Echo: EF               60-65%, no rwma, mod dil LA, nl RV, triv eff. No date: Anxiety No date: Asthma No date: Bronchitis No date: CHF (congestive heart failure) (HCC) No date: Chronic cough No date: Chronic respiratory failure with hypoxia and hypercapnia  (HCC)     Comment:  a. Home O2.  (2-4L) No date: CKD (chronic kidney disease), stage III (HCC) No date: Depression No date: Diabetes mellitus without complication (HCC) No date: Diverticulosis No date: Dyspnea No date: Dysrhythmia     Comment:  per pt, no arrythmias No date: Environmental and seasonal allergies No date: Extreme obesity with alveolar hypoventilation (HCC) No date: Hypertension No date: Iron deficiency anemia     Comment:  a. chronic blood loss->heavy menses. No date: Kidney failure     Comment:  dialysis tues/thur/sat   ... left arm shunt No date: Orthopnea No date: Oxygen dependent     Comment:  3l/min No date: Pericardial effusion     Comment:  a. 11/2014 CT Chest: Mod effusion; b. 11/2014 Echo:               small to mod circumferential effusion. No hemodynamic               compromise. No date: Pneumonia     Comment:  history No date: Poorly controlled diabetes mellitus ([place])     Comment:  a. 11/2014 A1c 13.2. No date: Swelling of both lower extremities  Reproductive/Obstetrics  Anesthesia Physical  Anesthesia Plan  ASA: 4  Anesthesia Plan: General   Post-op Pain Management:    Induction: Intravenous  PONV Risk Score and Plan: 3 and Ondansetron, Propofol infusion, TIVA, Midazolam and Dexamethasone  Airway Management Planned: Natural Airway and Simple Face Mask  Additional Equipment: None  Intra-op Plan:   Post-operative Plan:   Informed Consent: I have reviewed the patients History and Physical, chart, labs  and discussed the procedure including the risks, benefits and alternatives for the proposed anesthesia with the patient or authorized representative who has indicated his/her understanding and acceptance.     Dental advisory given  Plan Discussed with: CRNA and Surgeon  Anesthesia Plan Comments: (Discussed risks of anesthesia with patient, including possibility of difficulty with spontaneous ventilation under anesthesia necessitating airway intervention, PONV, and rare risks such as cardiac or respiratory or neurological events, and allergic reactions. Patient understands. Patient counseled on being higher risk for anesthesia due to comorbidities: ESRD, morbid obesity. Patient was told about increased risk of cardiac and respiratory events, including death. Patient understands. )        Anesthesia Quick Evaluation

## 2020-10-26 NOTE — Progress Notes (Signed)
Pt states she is really itchy. Dr. Rosey Bath notified. Acknowledged. Orders received.

## 2020-10-27 ENCOUNTER — Encounter: Payer: Self-pay | Admitting: Podiatry

## 2020-10-27 DIAGNOSIS — E1122 Type 2 diabetes mellitus with diabetic chronic kidney disease: Secondary | ICD-10-CM | POA: Diagnosis not present

## 2020-10-27 DIAGNOSIS — L97514 Non-pressure chronic ulcer of other part of right foot with necrosis of bone: Secondary | ICD-10-CM | POA: Diagnosis not present

## 2020-10-27 DIAGNOSIS — I96 Gangrene, not elsewhere classified: Secondary | ICD-10-CM | POA: Diagnosis not present

## 2020-10-27 DIAGNOSIS — E11621 Type 2 diabetes mellitus with foot ulcer: Secondary | ICD-10-CM | POA: Diagnosis not present

## 2020-10-27 DIAGNOSIS — I739 Peripheral vascular disease, unspecified: Secondary | ICD-10-CM | POA: Diagnosis not present

## 2020-10-27 DIAGNOSIS — H9191 Unspecified hearing loss, right ear: Secondary | ICD-10-CM | POA: Diagnosis not present

## 2020-10-27 LAB — AEROBIC/ANAEROBIC CULTURE W GRAM STAIN (SURGICAL/DEEP WOUND)

## 2020-10-27 LAB — GLUCOSE, CAPILLARY
Glucose-Capillary: 169 mg/dL — ABNORMAL HIGH (ref 70–99)
Glucose-Capillary: 166 mg/dL — ABNORMAL HIGH (ref 70–99)
Glucose-Capillary: 243 mg/dL — ABNORMAL HIGH (ref 70–99)
Glucose-Capillary: 220 mg/dL — ABNORMAL HIGH (ref 70–99)

## 2020-10-27 NOTE — Progress Notes (Addendum)
Patient refused to do dressing change. She states she want podiatry to do initial dressing change. Dr. Posey Pronto notified. He states that he will do the dressing change tomorrow.

## 2020-10-27 NOTE — Evaluation (Signed)
Physical Therapy Evaluation Patient Details Name: [name] MRN: [id number] DOB: [birth year] Today's Date: 10/27/2020   History of Present Illness  Pt is a 50 y.o. female presenting to hospital 8/21 with chest pain, fever, fatigue, and generalized malaise; also foul smell from R great toe (chronic ulcer).  Pt admitted with diabetic foot ulcers with gangrene of the 5th toe and osteomyelitis.  S/p R LE angio 8/24; s/p partial R foot 5th toe amp 8/27; and s/p I&D with revisional debridement R foot 9/1.  Per PT order 9/2: pt [place] R foot.  PMH includes DM, ESRD, anxiety, CHF, CKD, PNA, chronic respiratory failure with hypoxia and hypercapnia.  Clinical Impression  Prior to hospital admission, pt was ambulatory short distances within home with rollator; used electric scooter in community; lives with her husband in 1 level home with ramp to enter; uses 3 L home O2 baseline.  Currently pt is SBA with bed mobility; unable to stand up to walker with bed height elevated, max cueing, and 1 assist; and SBA with lateral scooting edge of bed a couple feet towards L side (bed height elevated).  Pt able to maintain [place] status during session but pt felt standing would be extremely difficult with this [place] restriction.  Discussed with pt w/c level transfer options.  Pt would benefit from skilled PT to address noted impairments and functional limitations (see below for any additional details).  Upon hospital discharge, pt would benefit from SNF.    Follow Up Recommendations SNF    Equipment Recommendations  Wheelchair (measurements PT);Wheelchair cushion (measurements PT);Other (comment) (slideboard)    Recommendations for Other Services OT consult     Precautions / Restrictions Precautions Precautions: Fall Restrictions Weight Bearing Restrictions: Yes RLE Weight Bearing: Non weight bearing Other Position/Activity Restrictions: [place] R foot      Mobility  Bed Mobility Overal bed mobility:  Needs Assistance Bed Mobility: Supine to Sit;Sit to Supine     Supine to sit: Supervision;HOB elevated Sit to supine: Supervision;HOB elevated   General bed mobility comments: mild increased effort to perform on own    Transfers Overall transfer level: Needs assistance Equipment used: Rolling walker (2 wheeled) Transfers: Sit to/from Stand;Lateral/Scoot Transfers Sit to Stand: From elevated surface (unable to stand with 1 assist)        Lateral/Scoot Transfers: Supervision;From elevated surface (SBA lateral scooting towards L a couple feet sitting edge of bed) General transfer comment: pt unable to stand up to walker with 1 assist and max cueing for technique (and bed height significantly raised)  Ambulation/Gait             General Gait Details: unable to stand to attempt  Stairs            Wheelchair Mobility    Modified Rankin (Stroke Patients Only)       Balance Overall balance assessment: Needs assistance Sitting-balance support: No upper extremity supported;Feet unsupported Sitting balance-Leahy Scale: Good Sitting balance - Comments: steady sitting reaching within BOS                                     Pertinent Vitals/Pain Pain Assessment: Faces Faces Pain Scale: Hurts a little bit Pain Location: R foot Pain Descriptors / Indicators: Tingling Pain Intervention(s): Limited activity within patient's tolerance;Monitored during session;Repositioned Vitals (HR and O2 on room air) stable and WFL throughout treatment session.    Home Living Family/patient expects to  be discharged to:: Private residence Living Arrangements: Spouse/significant other Available Help at Discharge: Family;Available PRN/intermittently Type of Home: House Home Access: Ramped entrance     Home Layout: One level Home Equipment: [place] - 4 wheels;Electric scooter;Shower seat;Hand held shower head;Grab bars - tub/shower;Bedside commode;Grab bars - toilet       Prior Function Level of [place]: Needs assistance   Gait / Transfers Assistance Needed: Ambulates 20-25 feet with borrowed rollator in home; used electric scooter in community  ADL's / Homemaking Assistance Needed: Per recent OT eval "Able to perform all basic self care, but endorses struggle. States her spouse performs all IADLs including cooking, cleaning and laundry."  Comments: 3 L home O2 baseline     Hand Dominance        Extremity/Trunk Assessment   Upper Extremity Assessment Upper Extremity Assessment: Generalized weakness    Lower Extremity Assessment Lower Extremity Assessment: Generalized weakness    Cervical / Trunk Assessment Cervical / Trunk Assessment: Normal  Communication   Communication: No difficulties  Cognition Arousal/Alertness: Awake/alert Behavior During Therapy: Anxious Overall Cognitive Status: Within Functional Limits for tasks assessed                                        General Comments General comments (skin integrity, edema, etc.): R foot dressings in place.  Nursing cleared pt for participation in physical therapy.  Pt agreeable to PT session.    Exercises  Transfer training   Assessment/Plan    PT Assessment Patient needs continued PT services  PT Problem List Decreased strength;Decreased activity tolerance;Decreased balance;Decreased mobility;Decreased knowledge of use of DME;Decreased knowledge of precautions;Pain;Decreased skin integrity       PT Treatment Interventions DME instruction;Gait training;Functional mobility training;Therapeutic activities;Therapeutic exercise;Balance training;Patient/family education    PT Goals (Current goals can be found in the Care Plan section)  Acute Rehab PT Goals Patient Stated Goal: to improve mobility PT Goal Formulation: With patient Time For Goal Achievement: 11/10/20 Potential to Achieve Goals: Fair    Frequency 7X/week   Barriers to discharge Decreased  caregiver support      Co-evaluation               AM-PAC PT "6 Clicks" Mobility  Outcome Measure Help needed turning from your back to your side while in a flat bed without using bedrails?: None Help needed moving from lying on your back to sitting on the side of a flat bed without using bedrails?: A Little Help needed moving to and from a bed to a chair (including a wheelchair)?: Total Help needed standing up from a chair using your arms (e.g., wheelchair or bedside chair)?: Total Help needed to walk in hospital room?: Total Help needed climbing 3-5 steps with a railing? : Total 6 Click Score: 11    End of Session Equipment Utilized During Treatment: Gait belt;Oxygen (3 L O2 via nasal cannula) Activity Tolerance: Patient tolerated treatment well Patient left: in bed;with call bell/phone within reach;with bed alarm set Nurse Communication: Mobility status;Precautions;Weight bearing status PT Visit Diagnosis: Other abnormalities of gait and mobility (R26.89);Muscle weakness (generalized) (M62.81);Pain Pain - Right/Left: Right Pain - part of body: Ankle and joints of foot    Time: 1555-1640 PT Time Calculation (min) (ACUTE ONLY): 45 min   Charges:   PT Evaluation $PT Eval Low Complexity: 1 Low PT Treatments $Therapeutic Activity: 23-37 mins       Raquel Sarna  Danise Dehne, PT 10/27/20, 5:19 PM

## 2020-10-27 NOTE — Progress Notes (Signed)
[facility]  ROUNDING NOTE   Subjective:   [name] was admitted to [facility] on 10/15/2020 for Gangrene [facility]) [I96] Diabetic foot ulcer ([place]) [E11.621, L97.509] Decreased pedal pulses [R09.89] Sepsis, due to unspecified organism, unspecified whether acute organ dysfunction present [facility]) [A41.9]  Last hemodialysis treatment was Saturday, 8/20. She was having hypotension on her treatment and she took midodrine to complete her treatment.   Patient seen sitting up in bed States she feels well Improved pain management  Voiced concerns about wound management   Objective:  Vital signs in last 24 hours:  Temp:  [97.5 F (36.4 C)-98.6 F (37 C)] 98.6 F (37 C) (09/02 0755) Pulse Rate:  [84-98] 88 (09/02 0525) Resp:  [12-25] 16 (09/02 0755) BP: (43-134)/(31-80) 98/52 (09/02 0755) SpO2:  [93 %-100 %] 98 % (09/02 0804)  Weight change:  Filed Weights   10/15/20 1905 10/18/20 0928  Weight: 126.1 kg 126.1 kg    Intake/Output: I/O last 3 completed shifts: In: 593.7 [I.V.:100; IV Piggyback:493.7] Out: 1739 [Other:1739]   Intake/Output this shift:  Total I/O In: 240 [P.O.:240] Out: -   Physical Exam: General: NAD, laying in bed  Head: Normocephalic, atraumatic. Moist oral mucosal membranes  Eyes: Anicteric  Lungs:  Clear to auscultation, O2 2L  Heart: Regular rate and rhythm  Abdomen:  Soft, nontender  Extremities:  trace peripheral edema. Right foot fifth toe amputation on 10/21/20, ace wrapped  Neurologic: Nonfocal, moving all four extremities  Skin: No lesions  Access: Left AVF    Basic Metabolic Panel: Recent Labs  Lab 10/21/20 0104 10/21/20 2694 10/21/20 8546 10/22/20 0523 10/23/20 0456 10/24/20 0527 10/25/20 0410 10/26/20 0439 10/26/20 1619  NA  --  126*   < > 128* 126* 123* 126* 124* 130*  K  --  5.0   < > 4.8 4.5 5.6* 3.9 4.4 4.0  CL  --  87*   < > 87* 88* 85* 87* 86* 95*  CO2  --  23   < > 26 23 18* 23 21*  --   GLUCOSE  --  126*   <  > 215* 131* 198* 199* 257* 143*  BUN  --  57*   < > 41* 53* 68* 51* 67* 36*  CREATININE  --  8.04*   < > 6.01* 7.09* 8.43* 6.74* 8.01* 6.20*  CALCIUM  --  7.8*   < > 7.7* 7.3* 7.0* 7.0* 6.9*  --   MG 2.0 2.1  --   --   --   --   --  2.0  --    < > = values in this interval not displayed.     Liver Function Tests: Recent Labs  Lab 10/22/20 0523 10/23/20 0456 10/24/20 0527 10/25/20 0410  AST 60* 88* 346* 561*  ALT 22 31 91* 167*  ALKPHOS 477* 474* 661* 661*  BILITOT 1.0 1.1 1.2 1.2  PROT 6.7 6.8 7.0 6.8  ALBUMIN 2.4* 2.4* 2.5* 2.3*    No results for input(s): LIPASE, AMYLASE in the last 168 hours. No results for input(s): AMMONIA in the last 168 hours.  CBC: Recent Labs  Lab 10/22/20 0523 10/22/20 2242 10/23/20 0456 10/24/20 0527 10/25/20 0410 10/26/20 0439 10/26/20 1619  WBC 31.3*  --  30.7* 28.2* 25.9* 25.4*  --   HGB 6.8*   < > 8.2* 8.1* 7.9* 8.4* 9.9*  HCT 21.0*   < > 24.7* 24.2* 24.1* 26.0* 29.0*  MCV 95.5  --  91.8 94.9  94.5 92.5  --   PLT 376  --  400 398 389 390  --    < > = values in this interval not displayed.     Cardiac Enzymes: No results for input(s): CKTOTAL, CKMB, CKMBINDEX, TROPONINI in the last 168 hours.  BNP: Invalid input(s): POCBNP  CBG: Recent Labs  Lab 10/26/20 1358 10/26/20 1749 10/26/20 2106 10/27/20 0759 10/27/20 0835  GLUCAP 117* 138* 283* 169* 166*     Microbiology: Results for orders placed or performed during the hospital encounter of 10/15/20  Blood Culture (routine x 2)     Status: None   Collection Time: 10/15/20  8:03 PM   Specimen: BLOOD  Result Value Ref Range Status   Specimen Description BLOOD BLOOD RIGHT FOREARM  Final   Special Requests   Final    BOTTLES DRAWN AEROBIC AND ANAEROBIC Blood Culture adequate volume   Culture   Final    NO GROWTH 5 DAYS Performed at [facility], [address] [place], [place] [address]    Report Status 10/20/2020 FINAL  Final  Resp Panel by RT-PCR (Flu A&B,  Covid) Nasopharyngeal Swab     Status: None   Collection Time: 10/15/20  8:26 PM   Specimen: Nasopharyngeal Swab; Nasopharyngeal(NP) swabs in vial transport medium  Result Value Ref Range Status   SARS Coronavirus 2 by RT PCR NEGATIVE NEGATIVE Final    Comment: (NOTE) SARS-CoV-2 target nucleic acids are NOT DETECTED.  The SARS-CoV-2 RNA is generally detectable in upper respiratory specimens during the acute phase of infection. The lowest concentration of SARS-CoV-2 viral copies this assay can detect is 138 copies/mL. A negative result does not preclude SARS-Cov-2 infection and should not be used as the sole basis for treatment or other patient management decisions. A negative result may occur with  improper specimen collection/handling, submission of specimen other than nasopharyngeal swab, presence of viral mutation(s) within the areas targeted by this assay, and inadequate number of viral copies(<138 copies/mL). A negative result must be combined with clinical observations, patient history, and epidemiological information. The expected result is Negative.  Fact Sheet for Patients:  EntrepreneurPulse.com.au  Fact Sheet for Healthcare Providers:  IncredibleEmployment.be  This test is no t yet approved or cleared by the [place] FDA and  has been authorized for detection and/or diagnosis of SARS-CoV-2 by FDA under an Emergency Use Authorization (EUA). This EUA will remain  in effect (meaning this test can be used) for the duration of the COVID-19 declaration under Section 564(b)(1) of the Act, 21 U.S.C.section 360bbb-3(b)(1), unless the authorization is terminated  or revoked sooner.       Influenza A by PCR NEGATIVE NEGATIVE Final   Influenza B by PCR NEGATIVE NEGATIVE Final    Comment: (NOTE) The Xpert Xpress SARS-CoV-2/FLU/RSV plus assay is intended as an aid in the diagnosis of influenza from Nasopharyngeal swab specimens and should  not be used as a sole basis for treatment. Nasal washings and aspirates are unacceptable for Xpert Xpress SARS-CoV-2/FLU/RSV testing.  Fact Sheet for Patients: EntrepreneurPulse.com.au  Fact Sheet for Healthcare Providers: IncredibleEmployment.be  This test is not yet approved or cleared by the [place] FDA and has been authorized for detection and/or diagnosis of SARS-CoV-2 by FDA under an Emergency Use Authorization (EUA). This EUA will remain in effect (meaning this test can be used) for the duration of the COVID-19 declaration under Section 564(b)(1) of the Act, 21 U.S.C. section 360bbb-3(b)(1), unless the authorization is terminated or revoked.  Performed  at [facility], [address] [place], [place] [address]   Blood Culture (routine x 2)     Status: None   Collection Time: 10/15/20  8:26 PM   Specimen: BLOOD  Result Value Ref Range Status   Specimen Description BLOOD RIGHT ANTECUBITAL  Final   Special Requests   Final    BOTTLES DRAWN AEROBIC AND ANAEROBIC Blood Culture adequate volume   Culture   Final    NO GROWTH 5 DAYS Performed at [facility], [address] [place], [place] [address]    Report Status 10/20/2020 FINAL  Final  Aerobic Culture w Gram Stain (superficial specimen)     Status: None   Collection Time: 10/17/20  8:52 PM   Specimen: Wound  Result Value Ref Range Status   Specimen Description WOUND  Final   Special Requests NONE  Final   Gram Stain   Final    RARE SQUAMOUS EPITHELIAL CELLS PRESENT FEW WBC PRESENT, PREDOMINANTLY MONONUCLEAR ABUNDANT GRAM VARIABLE ROD FEW GRAM POSITIVE COCCI    Culture   Final    MODERATE ENTEROCOCCUS FAECALIS MODERATE KLEBSIELLA OXYTOCA ABUNDANT DIPHTHEROIDS(CORYNEBACTERIUM SPECIES) Standardized susceptibility testing for this organism is not available.    Report Status 10/20/2020 FINAL  Final   Organism ID, Bacteria ENTEROCOCCUS FAECALIS  Final    Organism ID, Bacteria KLEBSIELLA OXYTOCA  Final      Susceptibility   Enterococcus faecalis - MIC*    AMPICILLIN <=2 SENSITIVE Sensitive     VANCOMYCIN 1 SENSITIVE Sensitive     GENTAMICIN SYNERGY RESISTANT Resistant     * MODERATE ENTEROCOCCUS FAECALIS   Klebsiella oxytoca - MIC*    AMPICILLIN >=32 RESISTANT Resistant     CEFAZOLIN <=4 SENSITIVE Sensitive     CEFEPIME <=0.12 SENSITIVE Sensitive     CEFTAZIDIME <=1 SENSITIVE Sensitive     CEFTRIAXONE <=0.25 SENSITIVE Sensitive     CIPROFLOXACIN <=0.25 SENSITIVE Sensitive     GENTAMICIN <=1 SENSITIVE Sensitive     IMIPENEM <=0.25 SENSITIVE Sensitive     TRIMETH/SULFA <=20 SENSITIVE Sensitive     AMPICILLIN/SULBACTAM 8 SENSITIVE Sensitive     PIP/TAZO <=4 SENSITIVE Sensitive     * MODERATE KLEBSIELLA OXYTOCA  Aerobic/Anaerobic Culture w Gram Stain (surgical/deep wound)     Status: None (Preliminary result)   Collection Time: 10/21/20  3:07 PM   Specimen: PATH Digit amputation; Tissue  Result Value Ref Range Status   Specimen Description   Final    FOOT  RIGHT 5TH DIGIT AMPUTATION Performed at [facility], [address], [place], [place] [address]    Special Requests   Final    NONE Performed at [facility], [place], [place] [address]    Gram Stain   Final    FEW SQUAMOUS EPITHELIAL CELLS PRESENT FEW WBC SEEN FEW GRAM POSITIVE COCCI Performed at [facility], [place] [address], [place], [place] [address]    Culture   Final    FEW STREPTOCOCCUS ANGINOSIS NO ANAEROBES ISOLATED; CULTURE IN PROGRESS FOR 5 DAYS    Report Status PENDING  Incomplete   Organism ID, Bacteria STREPTOCOCCUS ANGINOSIS  Final      Susceptibility   Streptococcus anginosis - MIC*    PENICILLIN 0.12 SENSITIVE Sensitive     CEFTRIAXONE 0.5 SENSITIVE Sensitive     ERYTHROMYCIN 4 RESISTANT Resistant     LEVOFLOXACIN 0.5 SENSITIVE Sensitive     VANCOMYCIN 0.5 SENSITIVE Sensitive     * FEW STREPTOCOCCUS  ANGINOSIS   Aerobic/Anaerobic Culture w Gram Stain (surgical/deep wound)     Status: None (Preliminary result)   Collection Time: 10/26/20  5:35 PM   Specimen: PATH Other; Tissue  Result Value Ref Range Status   Specimen Description   Final    FOOT RIGHT Performed at [facility], [address], [place], [place] [address]    Special Requests   Final    NONE Performed at [facility], [place], [place] [address]    Gram Stain NO WBC SEEN NO ORGANISMS SEEN   Final   Culture   Final    NO GROWTH < 24 HOURS Performed at [facility], [place] [address] [place], [place] [address]    Report Status PENDING  Incomplete    Coagulation Studies: Recent Labs    10/26/20 0439  LABPROT 16.3*  INR 1.3*     Urinalysis: No results for input(s): COLORURINE, LABSPEC, PHURINE, GLUCOSEU, HGBUR, BILIRUBINUR, KETONESUR, PROTEINUR, UROBILINOGEN, NITRITE, LEUKOCYTESUR in the last 72 hours.  Invalid input(s): APPERANCEUR    Imaging: No results found.   Medications:    sodium chloride 0 mL/hr at 10/22/20 1200   piperacillin-tazobactam (ZOSYN)  IV 2.25 g (10/27/20 0647)    vitamin C  500 mg Oral Daily   aspirin EC  81 mg Oral Daily   Chlorhexidine Gluconate Cloth  6 each Topical Daily   cholecalciferol  4,000 Units Oral Weekly   cinacalcet  60 mg Oral Daily   docusate sodium  100 mg Oral Daily   feeding supplement (NEPRO CARB STEADY)  237 mL Oral BID BM   ferric citrate  210 mg Oral TID WC   heparin  5,000 Units Subcutaneous Q8H   insulin aspart  0-5 Units Subcutaneous QHS   insulin aspart  0-9 Units Subcutaneous TID WC   insulin aspart  2 Units Subcutaneous TID WC   insulin glargine-yfgn  36 Units Subcutaneous QHS   ipratropium-albuterol  3 mL Nebulization TID   linaclotide  290 mcg Oral q morning   loratadine  10 mg Oral Daily   mometasone-formoterol  2 puff Inhalation BID   multivitamin  1 tablet Oral QHS   pantoprazole  40 mg Oral Daily   sodium  chloride flush  3 mL Intravenous Q12H   sodium chloride, acetaminophen, albuterol, albuterol, ALPRAZolam, alum & mag hydroxide-simeth, calcium carbonate, dextromethorphan-guaiFENesin, diphenhydrAMINE, hydrALAZINE, HYDROmorphone (DILAUDID) injection, lactulose, metoprolol tartrate, ondansetron (ZOFRAN) IV, ondansetron, oxyCODONE, phenol, polyethylene glycol, sodium chloride flush, traZODone  Assessment/ Plan:  [name] is a 50 y.o. black female with end stage renal disease on hemodialysis, diabetes mellitus type II, hypertension, congestive heart failure, COPD, who is admitted to [facility] on 10/15/2020 for Gangrene [facility]) [I96] Diabetic foot ulcer ([place]) [X83.382, L97.509] Decreased pedal pulses [R09.89] Sepsis, due to unspecified organism, unspecified whether acute organ dysfunction present [facility]) [A41.9]  [facility] Left AVF 127.5kg  End Stage Renal Disease: last dialysis was 10/14/20. Received dialysis yesterday, UF 1.7L removed.  Next treatment scheduled for Saturday.  Hypotension: secondary to infection. Holding home regimen torsemide, losartan, clonidine.98/52. Low BP during dialysis, protocols followed.   Anemia with chronic kidney disease: hemoglobin 9.9. Low dose EPO with next HD treatment.   Secondary Hyperparathyroidism with hyeprphosphatemia: there is risk for calciphylaxis. Outpatinet labs on 8/9: phos 10.6, calcium 8.2, PTH 1456.  - cinacalcet and sevelamer with meals  Diabetes mellitus type II with chronic kidney disease: insulin dependent. Complicated with diabetic foot ulcer.  History of poor control. Hemoglobin A1c 9.2% on 09/19/2020.  - Appreciate podiatry and vascular input.  - empiric ceftriaxone and metronidazole.  - Vascular performed right lower angiogram today. 30% mild lesion found popliteal artery. Deemed adequate for wound healing.  - Podiatry performed right foot, partial fifth toe amputation on 10/21/20. Daily dressing changes will determine if  further intervention is needed.    6.    Hyponatremia: Improved to 130.     LOS: 12 Lekia Nier 9/2/202211:05 AM

## 2020-10-27 NOTE — Progress Notes (Signed)
Date of Admission:  10/15/2020      ID: [name] is a 51 y.o. female Principal Problem:   Diabetic foot ulcer ([place]) Active Problems:   Diabetes ([place])   Essential hypertension   Type 2 diabetes mellitus with ESRD (end-stage renal disease) ([place])   DNR (do not resuscitate) discussion   End-stage renal disease on hemodialysis ([place])   Chronic respiratory failure with hypoxia (HCC)   Decreased pedal pulses   Gangrene (HCC)   PVD (peripheral vascular disease) (HCC)   Aspiration into airway   Hearing loss of right ear   Hyponatremia    Subjective: Doing okay Upset podiatrist have not seen her today  Medications:   vitamin C  500 mg Oral Daily   aspirin EC  81 mg Oral Daily   Chlorhexidine Gluconate Cloth  6 each Topical Daily   cholecalciferol  4,000 Units Oral Weekly   cinacalcet  60 mg Oral Daily   docusate sodium  100 mg Oral Daily   feeding supplement (NEPRO CARB STEADY)  237 mL Oral BID BM   ferric citrate  210 mg Oral TID WC   heparin  5,000 Units Subcutaneous Q8H   insulin aspart  0-5 Units Subcutaneous QHS   insulin aspart  0-9 Units Subcutaneous TID WC   insulin aspart  2 Units Subcutaneous TID WC   insulin glargine-yfgn  36 Units Subcutaneous QHS   ipratropium-albuterol  3 mL Nebulization TID   linaclotide  290 mcg Oral q morning   loratadine  10 mg Oral Daily   mometasone-formoterol  2 puff Inhalation BID   multivitamin  1 tablet Oral QHS   pantoprazole  40 mg Oral Daily   sodium chloride flush  3 mL Intravenous Q12H    Objective: Vital signs in last 24 hours: Temp:  [97.5 F (36.4 C)-98.7 F (37.1 C)] 98.2 F (36.8 C) (09/02 1612) Pulse Rate:  [88-98] 88 (09/02 1612) Resp:  [12-25] 16 (09/02 1612) BP: (90-134)/(48-80) 102/48 (09/02 1612) SpO2:  [93 %-100 %] 94 % (09/02 1301)  PHYSICAL EXAM:  General: Alert, cooperative, no distress, appears stated age.  Lungs: Clear to auscultation bilaterally. No Wheezing or Rhonchi. No rales. Heart:  Regular rate and rhythm, no murmur, rub or gallop.  Extremities: rt foot dressing not removed  Skin: No rashes or lesions. Or bruising Lymph: Cervical, supraclavicular normal. Neurologic: Grossly non-focal  Lab Results Recent Labs    10/25/20 0410 10/26/20 0439 10/26/20 1619  WBC 25.9* 25.4*  --   HGB 7.9* 8.4* 9.9*  HCT 24.1* 26.0* 29.0*  NA 126* 124* 130*  K 3.9 4.4 4.0  CL 87* 86* 95*  CO2 23 21*  --   BUN 51* 67* 36*  CREATININE 6.74* 8.01* 6.20*   Liver Panel Recent Labs    10/25/20 0410  PROT 6.8  ALBUMIN 2.3*  AST 561*  ALT 167*  ALKPHOS 661*  BILITOT 1.2      Assessment/Plan:  Diabetic foot infection with necrosis Partial 5th ray amputation. Also had a foot abscess, which has been drained- this is a complex abscess of  deep spaces. Bone pathology , margin has acute osteo Strep anginosis in culture Prior superficial culture was klebsiella and enterococcus Because of non viable tissue /poor circulation BKA was recommended and she was not for it . So a wash out/debridemen done on the rt foot on 10/26/20 Continue zosyn until definite surgical plan   ESRD on dialysis   DM On insulin  Discussed the management with patient Informed podiatrist that patient wanted to talk to them

## 2020-10-27 NOTE — Progress Notes (Signed)
Patient ID: [name], female   DOB: [birth year], 50 y.o.   MRN: [id number] [facility] PROGRESS NOTE  [name] OIN:[id number] DOB: [birth year] DOA: 10/15/2020 PCP: [name], NP  HPI/Subjective: Patient able to flex and extend at the ankle and wiggle toes on her right foot.  Some pain but not a lot.  Feels okay. Admitted 11 days ago with infected diabetic foot ulcer.  Objective: Vitals:   10/27/20 1202 10/27/20 1301  BP:  (!) 106/59  Pulse:  91  Resp:  18  Temp:  98.7 F (37.1 C)  SpO2: 98% 94%    Intake/Output Summary (Last 24 hours) at 10/27/2020 1508 Last data filed at 10/27/2020 1024 Gross per 24 hour  Intake 833.74 ml  Output --  Net 833.74 ml   Filed Weights   10/15/20 1905 10/18/20 0928  Weight: 126.1 kg 126.1 kg    ROS: Review of Systems  Respiratory:  Positive for shortness of breath.   Cardiovascular:  Negative for chest pain.  Gastrointestinal:  Negative for abdominal pain, nausea and vomiting.  Musculoskeletal:  Positive for joint pain.  Exam: Physical Exam HENT:     Head: Normocephalic.     Mouth/Throat:     Pharynx: No oropharyngeal exudate.  Eyes:     General: Lids are normal.     Conjunctiva/sclera: Conjunctivae normal.  Cardiovascular:     Rate and Rhythm: Normal rate and regular rhythm.     Heart sounds: Normal heart sounds, S1 normal and S2 normal.  Pulmonary:     Breath sounds: Normal breath sounds. No decreased breath sounds, wheezing, rhonchi or rales.  Abdominal:     Palpations: Abdomen is soft.     Tenderness: There is no abdominal tenderness.  Musculoskeletal:     Right ankle: No swelling.     Left ankle: No swelling.  Skin:    General: Skin is warm.     Comments: Right foot covered. Bilateral lower extremities small blackish dots which could be calciphylaxis.  Neurological:     Mental Status: She is alert and oriented to person, place, and time.     Comments: Patient able to wiggle toes and flex and extend at  the ankle and the right foot      Scheduled Meds:  vitamin C  500 mg Oral Daily   aspirin EC  81 mg Oral Daily   Chlorhexidine Gluconate Cloth  6 each Topical Daily   cholecalciferol  4,000 Units Oral Weekly   cinacalcet  60 mg Oral Daily   docusate sodium  100 mg Oral Daily   feeding supplement (NEPRO CARB STEADY)  237 mL Oral BID BM   ferric citrate  210 mg Oral TID WC   heparin  5,000 Units Subcutaneous Q8H   insulin aspart  0-5 Units Subcutaneous QHS   insulin aspart  0-9 Units Subcutaneous TID WC   insulin aspart  2 Units Subcutaneous TID WC   insulin glargine-yfgn  36 Units Subcutaneous QHS   ipratropium-albuterol  3 mL Nebulization TID   linaclotide  290 mcg Oral q morning   loratadine  10 mg Oral Daily   mometasone-formoterol  2 puff Inhalation BID   multivitamin  1 tablet Oral QHS   pantoprazole  40 mg Oral Daily   sodium chloride flush  3 mL Intravenous Q12H   Continuous Infusions:  sodium chloride 0 mL/hr at 10/22/20 1200   piperacillin-tazobactam (ZOSYN)  IV 2.25 g (10/27/20 7096)    Assessment/Plan:  Diabetic  foot ulcers with gangrene of the fifth toe and osteomyelitis.  Patient had amputation of the fifth ray and I&D of the foot on 10/21/2020.  Patient had washout of the foot on 10/26/2020.  Patient declining any further amputation at this point.  Case discussed with infectious disease.  Currently on IV Zosyn but plan to either do something with dialysis or oral antibiotics upon disposition.  Need to watch the foot over the next few days to see how things progress. Peripheral vascular disease.  Angiogram on 10/18/2020 showed popliteal artery mild lesion 30% of mass on aspirin. Decreased hearing.  Continue Claritin. Type 2 diabetes mellitus with end-stage renal disease.  Hemodialysis Tuesday Thursday and Saturday.  Patient found glargine insulin and sliding scale. Anemia of chronic disease.  Last hemoglobin 9.9. Hyponatremia.  Managed with hemodialysis. Chronic  hypoxic respiratory failure.  Baseline 3 to 4 L of oxygen.  Continue Dulera inhaler Morbid obesity with a BMI of 46.9 Blackish lesions bilateral lower extremity could be consistent with calciphylaxis     Code Status:     Code Status Orders  (From admission, onward)           Start     Ordered   10/15/20 2114  Full code  Continuous        10/15/20 2119           Code Status History     Date Active Date Inactive Code Status Order ID Comments User Context   03/24/2017 1953 04/28/2017 1906 Full Code [id number]  Hillary Bow, MD ED   03/03/2017 1420 03/10/2017 2131 Full Code [id number]  Fritzi Mandes, MD ED   01/07/2017 1725 01/10/2017 1900 Full Code [id number]  Demetrios Loll, MD Inpatient   11/08/2016 0450 11/16/2016 1717 Full Code [id number]  Hugelmeyer, Alexis, DO Inpatient   06/25/2016 1447 06/29/2016 1815 Full Code [id number]  Hillary Bow, MD ED   08/09/2015 1757 08/13/2015 1721 Full Code [id number]  Bettey Costa, MD Inpatient   12/31/2014 2025 01/03/2015 1749 Full Code [id number]  Hower, Aaron Mose, MD ED   12/15/2014 1154 12/19/2014 1657 Full Code [id number]  Hillary Bow, MD ED      Family Communication: Spoke with husband on the phone at [phone] Disposition Plan: Status is: Inpatient  Dispo: The patient is from: Home              Anticipated d/c is to: Home              Patient currently postoperative day 1 after cleaning out procedure by podiatry.  Continue IV antibiotics.   Difficult to place patient.  No.  Consultants: Nephrology Podiatry Infectious disease Vascular surgery  Procedures: Amputation of the fifth toe, cleanout procedure  Antibiotics: Currently on IV Zosyn  Time spent: 27 minutes  [place]

## 2020-10-27 NOTE — Plan of Care (Signed)
Patient alert and oriented x 4, complains of generalized pain relieved with prn pain medications. Blood pressures low with monitoring, NP notified and verbalized that she was ok with the patients readings. Patient due for dressing change to right foot, however patient prefers for providers to initiate the first dressing change. Stable condition at the end of shift. Will continue to monitor.  Problem: Education: Goal: Knowledge of General Education information will improve Description: Including pain rating scale, medication(s)/side effects and non-pharmacologic comfort measures Outcome: Progressing   Problem: Activity: Goal: Risk for activity intolerance will decrease Outcome: Progressing   Problem: Pain Managment: Goal: General experience of comfort will improve Outcome: Progressing   Problem: Safety: Goal: Ability to remain free from injury will improve Outcome: Progressing   Problem: Skin Integrity: Goal: Risk for impaired skin integrity will decrease Outcome: Progressing

## 2020-10-28 DIAGNOSIS — D638 Anemia in other chronic diseases classified elsewhere: Secondary | ICD-10-CM

## 2020-10-28 DIAGNOSIS — E1122 Type 2 diabetes mellitus with diabetic chronic kidney disease: Secondary | ICD-10-CM | POA: Diagnosis not present

## 2020-10-28 DIAGNOSIS — I739 Peripheral vascular disease, unspecified: Secondary | ICD-10-CM | POA: Diagnosis not present

## 2020-10-28 DIAGNOSIS — E11621 Type 2 diabetes mellitus with foot ulcer: Secondary | ICD-10-CM | POA: Diagnosis not present

## 2020-10-28 LAB — GLUCOSE, CAPILLARY
Glucose-Capillary: 162 mg/dL — ABNORMAL HIGH (ref 70–99)
Glucose-Capillary: 277 mg/dL — ABNORMAL HIGH (ref 70–99)
Glucose-Capillary: 195 mg/dL — ABNORMAL HIGH (ref 70–99)

## 2020-10-28 NOTE — Progress Notes (Signed)
This morning blood sugar did not pull over to chart. [name] blood sugar 191 this morning at 0755.

## 2020-10-28 NOTE — Progress Notes (Signed)
Patient completed as ordered. BP remain within ranged. Patient tolerated treatment well. 3 liters removed over 3 hours. Report given to [facility].

## 2020-10-28 NOTE — Progress Notes (Signed)
  Subjective:  Patient ID: [name], female    DOB: 11-30-1970,  MRN: [id number]  She states that she is doing well.  No nausea fever chills vomiting.  She is in better mood today.  She was waiting for me to change the bandage and discuss further options.  Negative for chest pain and shortness of breath Fever: no Night sweats: no Constitutional signs: no Review of all other systems is negative Objective:   Vitals:   10/28/20 1300 10/28/20 1321  BP: 108/65 107/70  Pulse: (!) 106 86  Resp: 16 16  Temp: 98.5 F (36.9 C) 98.3 F (36.8 C)  SpO2:  100%   General AA&O x3. Normal mood and affect.  Vascular Dorsalis pedis and posterior tibial pulses non palpable  Neurologic Epicritic sensation grossly reduced.  Dermatologic Continued necrosis noted to the plantar vault.  No bleeding noted in the tissue margins.  No purulent drainage that was expressed upon mechanical decompression.  However the tissue quality is poor.  Bleeding is limited.  No granular tissue noted.  Mostly fibrotic no cellulitis noted.  Orthopedic: MMT 5/5 in dorsiflexion, plantarflexion, inversion, and eversion. Normal joint ROM without pain or crepitus.   Angiogram with good three-vessel runoff to foot, I again reviewed the images today and she does have an intact perforator but these were prior to the last infection  Assessment & Plan:  Patient was evaluated and treated and all questions answered.  Gangrene of the right foot -I discussed with the patient the etiology of gangrene and the plantar wound as well as lateral wound with infection traveling up to the level of the heel.  I discussed with her that at this point given the nature of the infection in the setting of poor vascular flow/bleeding I believe patient will benefit benefit from major amputation from both functional standpoint as well as infection control standpoint.  I discussed this with her in extensive detail.  She is amenable to major amputation  however she is still considering and would like to get a second opinion from another hospital system. -She will ultimately decide by tonight and will let the hospitalist know either to undergo major amputation or be transferred to another hospital system for second opinion. -Continue local wound care with Betadine wet-to-dry dressing changes.  Nursing can perform the dressing changes daily. -Podiatry will continue to follow from [place].  At this time there is not much that we are able to offer in terms of salvageability and I discussed this with patient in extensive detail.  She states understanding.   [name], DPM  Accessible via secure chat for questions or concerns.

## 2020-10-28 NOTE — Progress Notes (Signed)
[facility]  ROUNDING NOTE   Subjective:   [name] was admitted to [facility] on 10/15/2020 for Gangrene [facility]) [I96] Diabetic foot ulcer ([place]) [E11.621, L97.509] Decreased pedal pulses [R09.89] Sepsis, due to unspecified organism, unspecified whether acute organ dysfunction present [facility]) [A41.9]  Last hemodialysis treatment was Saturday, 8/20. She was having hypotension on her treatment and she took midodrine to complete her treatment.   Patient seen during dialysis   HEMODIALYSIS FLOWSHEET:  Blood Flow Rate (mL/min): 300 mL/min Arterial Pressure (mmHg): -100 mmHg Venous Pressure (mmHg): 230 mmHg Transmembrane Pressure (mmHg): 80 mmHg Ultrafiltration Rate (mL/min): 1160 mL/min Dialysate Flow Rate (mL/min): 500 ml/min Conductivity: Machine : 13.7 Conductivity: Machine : 13.7 Dialysis Fluid Bolus: Normal Saline Bolus Amount (mL): 200 mL   No complaints at this time Voiced concerns about low blood pressure and not being given midodrine prior to treatment  Objective:  Vital signs in last 24 hours:  Temp:  [98.1 F (36.7 C)-98.7 F (37.1 C)] 98.1 F (36.7 C) (09/03 0731) Pulse Rate:  [52-91] 60 (09/03 1130) Resp:  [14-20] 14 (09/03 1200) BP: (37-126)/(24-101) 124/65 (09/03 1200) SpO2:  [94 %-100 %] 97 % (09/03 0736)  Weight change:  Filed Weights   10/15/20 1905 10/18/20 0928  Weight: 126.1 kg 126.1 kg    Intake/Output: I/O last 3 completed shifts: In: 1123.7 [P.O.:480; IV Piggyback:643.7] Out: -    Intake/Output this shift:  No intake/output data recorded.  Physical Exam: General: NAD, laying in bed  Head: Normocephalic, atraumatic. Moist oral mucosal membranes  Eyes: Anicteric  Lungs:  Clear to auscultation, O2 2L  Heart: Regular rate and rhythm  Abdomen:  Soft, nontender  Extremities:  trace peripheral edema. Right foot fifth toe amputation on 10/21/20, ace wrapped  Neurologic: Nonfocal, moving all four extremities  Skin: No lesions   Access: Left AVF    Basic Metabolic Panel: Recent Labs  Lab 10/22/20 0523 10/23/20 0456 10/24/20 0527 10/25/20 0410 10/26/20 0439 10/26/20 1619  NA 128* 126* 123* 126* 124* 130*  K 4.8 4.5 5.6* 3.9 4.4 4.0  CL 87* 88* 85* 87* 86* 95*  CO2 26 23 18* 23 21*  --   GLUCOSE 215* 131* 198* 199* 257* 143*  BUN 41* 53* 68* 51* 67* 36*  CREATININE 6.01* 7.09* 8.43* 6.74* 8.01* 6.20*  CALCIUM 7.7* 7.3* 7.0* 7.0* 6.9*  --   MG  --   --   --   --  2.0  --      Liver Function Tests: Recent Labs  Lab 10/22/20 0523 10/23/20 0456 10/24/20 0527 10/25/20 0410  AST 60* 88* 346* 561*  ALT 22 31 91* 167*  ALKPHOS 477* 474* 661* 661*  BILITOT 1.0 1.1 1.2 1.2  PROT 6.7 6.8 7.0 6.8  ALBUMIN 2.4* 2.4* 2.5* 2.3*    No results for input(s): LIPASE, AMYLASE in the last 168 hours. No results for input(s): AMMONIA in the last 168 hours.  CBC: Recent Labs  Lab 10/22/20 0523 10/22/20 2242 10/23/20 0456 10/24/20 0527 10/25/20 0410 10/26/20 0439 10/26/20 1619  WBC 31.3*  --  30.7* 28.2* 25.9* 25.4*  --   HGB 6.8*   < > 8.2* 8.1* 7.9* 8.4* 9.9*  HCT 21.0*   < > 24.7* 24.2* 24.1* 26.0* 29.0*  MCV 95.5  --  91.8 94.9 94.5 92.5  --   PLT 376  --  400 398 389 390  --    < > = values in this interval not displayed.  Cardiac Enzymes: No results for input(s): CKTOTAL, CKMB, CKMBINDEX, TROPONINI in the last 168 hours.  BNP: Invalid input(s): POCBNP  CBG: Recent Labs  Lab 10/26/20 2106 10/27/20 0759 10/27/20 0835 10/27/20 1206 10/27/20 1549  GLUCAP 283* 169* 166* 243* 220*     Microbiology: Results for orders placed or performed during the hospital encounter of 10/15/20  Blood Culture (routine x 2)     Status: None   Collection Time: 10/15/20  8:03 PM   Specimen: BLOOD  Result Value Ref Range Status   Specimen Description BLOOD BLOOD RIGHT FOREARM  Final   Special Requests   Final    BOTTLES DRAWN AEROBIC AND ANAEROBIC Blood Culture adequate volume   Culture   Final     NO GROWTH 5 DAYS Performed at [facility], [address] [place], [place] [address]    Report Status 10/20/2020 FINAL  Final  Resp Panel by RT-PCR (Flu A&B, Covid) Nasopharyngeal Swab     Status: None   Collection Time: 10/15/20  8:26 PM   Specimen: Nasopharyngeal Swab; Nasopharyngeal(NP) swabs in vial transport medium  Result Value Ref Range Status   SARS Coronavirus 2 by RT PCR NEGATIVE NEGATIVE Final    Comment: (NOTE) SARS-CoV-2 target nucleic acids are NOT DETECTED.  The SARS-CoV-2 RNA is generally detectable in upper respiratory specimens during the acute phase of infection. The lowest concentration of SARS-CoV-2 viral copies this assay can detect is 138 copies/mL. A negative result does not preclude SARS-Cov-2 infection and should not be used as the sole basis for treatment or other patient management decisions. A negative result may occur with  improper specimen collection/handling, submission of specimen other than nasopharyngeal swab, presence of viral mutation(s) within the areas targeted by this assay, and inadequate number of viral copies(<138 copies/mL). A negative result must be combined with clinical observations, patient history, and epidemiological information. The expected result is Negative.  Fact Sheet for Patients:  EntrepreneurPulse.com.au  Fact Sheet for Healthcare Providers:  IncredibleEmployment.be  This test is no t yet approved or cleared by the [place] FDA and  has been authorized for detection and/or diagnosis of SARS-CoV-2 by FDA under an Emergency Use Authorization (EUA). This EUA will remain  in effect (meaning this test can be used) for the duration of the COVID-19 declaration under Section 564(b)(1) of the Act, 21 U.S.C.section 360bbb-3(b)(1), unless the authorization is terminated  or revoked sooner.       Influenza A by PCR NEGATIVE NEGATIVE Final   Influenza B by PCR NEGATIVE  NEGATIVE Final    Comment: (NOTE) The Xpert Xpress SARS-CoV-2/FLU/RSV plus assay is intended as an aid in the diagnosis of influenza from Nasopharyngeal swab specimens and should not be used as a sole basis for treatment. Nasal washings and aspirates are unacceptable for Xpert Xpress SARS-CoV-2/FLU/RSV testing.  Fact Sheet for Patients: EntrepreneurPulse.com.au  Fact Sheet for Healthcare Providers: IncredibleEmployment.be  This test is not yet approved or cleared by the [place] FDA and has been authorized for detection and/or diagnosis of SARS-CoV-2 by FDA under an Emergency Use Authorization (EUA). This EUA will remain in effect (meaning this test can be used) for the duration of the COVID-19 declaration under Section 564(b)(1) of the Act, 21 U.S.C. section 360bbb-3(b)(1), unless the authorization is terminated or revoked.  Performed at [facility], [address], [place], [place] [address]   Blood Culture (routine x 2)     Status: None   Collection Time: 10/15/20  8:26 PM  Specimen: BLOOD  Result Value Ref Range Status   Specimen Description BLOOD RIGHT ANTECUBITAL  Final   Special Requests   Final    BOTTLES DRAWN AEROBIC AND ANAEROBIC Blood Culture adequate volume   Culture   Final    NO GROWTH 5 DAYS Performed at [facility], [address] [place], [place] [address]    Report Status 10/20/2020 FINAL  Final  Aerobic Culture w Gram Stain (superficial specimen)     Status: None   Collection Time: 10/17/20  8:52 PM   Specimen: Wound  Result Value Ref Range Status   Specimen Description WOUND  Final   Special Requests NONE  Final   Gram Stain   Final    RARE SQUAMOUS EPITHELIAL CELLS PRESENT FEW WBC PRESENT, PREDOMINANTLY MONONUCLEAR ABUNDANT GRAM VARIABLE ROD FEW GRAM POSITIVE COCCI    Culture   Final    MODERATE ENTEROCOCCUS FAECALIS MODERATE KLEBSIELLA OXYTOCA ABUNDANT  DIPHTHEROIDS(CORYNEBACTERIUM SPECIES) Standardized susceptibility testing for this organism is not available.    Report Status 10/20/2020 FINAL  Final   Organism ID, Bacteria ENTEROCOCCUS FAECALIS  Final   Organism ID, Bacteria KLEBSIELLA OXYTOCA  Final      Susceptibility   Enterococcus faecalis - MIC*    AMPICILLIN <=2 SENSITIVE Sensitive     VANCOMYCIN 1 SENSITIVE Sensitive     GENTAMICIN SYNERGY RESISTANT Resistant     * MODERATE ENTEROCOCCUS FAECALIS   Klebsiella oxytoca - MIC*    AMPICILLIN >=32 RESISTANT Resistant     CEFAZOLIN <=4 SENSITIVE Sensitive     CEFEPIME <=0.12 SENSITIVE Sensitive     CEFTAZIDIME <=1 SENSITIVE Sensitive     CEFTRIAXONE <=0.25 SENSITIVE Sensitive     CIPROFLOXACIN <=0.25 SENSITIVE Sensitive     GENTAMICIN <=1 SENSITIVE Sensitive     IMIPENEM <=0.25 SENSITIVE Sensitive     TRIMETH/SULFA <=20 SENSITIVE Sensitive     AMPICILLIN/SULBACTAM 8 SENSITIVE Sensitive     PIP/TAZO <=4 SENSITIVE Sensitive     * MODERATE KLEBSIELLA OXYTOCA  Aerobic/Anaerobic Culture w Gram Stain (surgical/deep wound)     Status: None   Collection Time: 10/21/20  3:07 PM   Specimen: PATH Digit amputation; Tissue  Result Value Ref Range Status   Specimen Description   Final    FOOT  RIGHT 5TH DIGIT AMPUTATION Performed at [facility], [address] [place], [place] [address]    Special Requests   Final    NONE Performed at [facility], [place], [place] [address]    Gram Stain   Final    FEW SQUAMOUS EPITHELIAL CELLS PRESENT FEW WBC SEEN FEW GRAM POSITIVE COCCI    Culture   Final    FEW STREPTOCOCCUS ANGINOSIS NO ANAEROBES ISOLATED Performed at [facility], [place] [address] [place], [place] [address]    Report Status 10/27/2020 FINAL  Final   Organism ID, Bacteria STREPTOCOCCUS ANGINOSIS  Final      Susceptibility   Streptococcus anginosis - MIC*    PENICILLIN 0.12 SENSITIVE Sensitive     CEFTRIAXONE 0.5 SENSITIVE  Sensitive     ERYTHROMYCIN 4 RESISTANT Resistant     LEVOFLOXACIN 0.5 SENSITIVE Sensitive     VANCOMYCIN 0.5 SENSITIVE Sensitive     * FEW STREPTOCOCCUS ANGINOSIS  Aerobic/Anaerobic Culture w Gram Stain (surgical/deep wound)     Status: None (Preliminary result)   Collection Time: 10/26/20  5:35 PM   Specimen: PATH Other; Tissue  Result Value Ref Range Status   Specimen  Description   Final    FOOT RIGHT Performed at [facility], [address] [place], [place] [address]    Special Requests   Final    NONE Performed at [facility], [place], [place] [address]    Gram Stain NO WBC SEEN NO ORGANISMS SEEN   Final   Culture   Final    CULTURE REINCUBATED FOR BETTER GROWTH Performed at [facility], [place] [address] [place], [place] [address]    Report Status PENDING  Incomplete    Coagulation Studies: Recent Labs    10/26/20 0439  LABPROT 16.3*  INR 1.3*     Urinalysis: No results for input(s): COLORURINE, LABSPEC, PHURINE, GLUCOSEU, HGBUR, BILIRUBINUR, KETONESUR, PROTEINUR, UROBILINOGEN, NITRITE, LEUKOCYTESUR in the last 72 hours.  Invalid input(s): APPERANCEUR    Imaging: No results found.   Medications:    sodium chloride 0 mL/hr at 10/22/20 1200   piperacillin-tazobactam (ZOSYN)  IV 2.25 g (10/28/20 0722)    vitamin C  500 mg Oral Daily   aspirin EC  81 mg Oral Daily   Chlorhexidine Gluconate Cloth  6 each Topical Daily   cholecalciferol  4,000 Units Oral Weekly   cinacalcet  60 mg Oral Daily   docusate sodium  100 mg Oral Daily   feeding supplement (NEPRO CARB STEADY)  237 mL Oral BID BM   ferric citrate  210 mg Oral TID WC   heparin  5,000 Units Subcutaneous Q8H   insulin aspart  0-5 Units Subcutaneous QHS   insulin aspart  0-9 Units Subcutaneous TID WC   insulin aspart  2 Units Subcutaneous TID WC   insulin glargine-yfgn  36 Units Subcutaneous QHS   ipratropium-albuterol  3 mL Nebulization TID   linaclotide   290 mcg Oral q morning   loratadine  10 mg Oral Daily   mometasone-formoterol  2 puff Inhalation BID   multivitamin  1 tablet Oral QHS   pantoprazole  40 mg Oral Daily   sodium chloride flush  3 mL Intravenous Q12H   sodium chloride, acetaminophen, albuterol, albuterol, ALPRAZolam, alum & mag hydroxide-simeth, calcium carbonate, dextromethorphan-guaiFENesin, diphenhydrAMINE, hydrALAZINE, HYDROmorphone (DILAUDID) injection, lactulose, metoprolol tartrate, ondansetron (ZOFRAN) IV, ondansetron, oxyCODONE, phenol, polyethylene glycol, sodium chloride flush, traZODone  Assessment/ Plan:  [name] is a 50 y.o. black female with end stage renal disease on hemodialysis, diabetes mellitus type II, hypertension, congestive heart failure, COPD, who is admitted to [facility] on 10/15/2020 for Gangrene [facility]) [I96] Diabetic foot ulcer ([place]) [X51.700, L97.509] Decreased pedal pulses [R09.89] Sepsis, due to unspecified organism, unspecified whether acute organ dysfunction present [facility]) [A41.9]  [facility] Left AVF 127.5kg  End Stage Renal Disease: last dialysis was 10/14/20. Received dialysis today, UF 3L removed.  Next treatment scheduled for Tuesday. Despite increased Uf's during recent treatments, patient feels like she has more fluid. May consider extra treatment on Monday.   Hypotension: secondary to infection. Holding home regimen torsemide, losartan, clonidine.98/52. Midodrine possibly held due to vasoconstriction and this may hinder recent amputation procedure. Will monitor BP and give as needed vs prophylactically. BP 124/65 after treatment.   Anemia with chronic kidney disease: hemoglobin 9.9. Low dose EPO with next HD treatment.   Secondary Hyperparathyroidism with hyeprphosphatemia: there is risk for calciphylaxis. Outpatinet labs on 8/9: phos 10.6, calcium 8.2, PTH 1456.  - cinacalcet and sevelamer with meals - Calcium decreased, will monitor  Diabetes mellitus type II with  chronic kidney disease: insulin dependent.  Complicated with diabetic foot ulcer. History of poor control. Hemoglobin A1c 9.2% on 09/19/2020.  - Appreciate podiatry and vascular input.  - empiric ceftriaxone and metronidazole.  - Vascular performed right lower angiogram today. 30% mild lesion found popliteal artery. Deemed adequate for wound healing.  - Podiatry performed right foot, partial fifth toe amputation on 10/21/20. Daily dressing changes will determine if further intervention is needed.    6.    Hyponatremia: Improved to 130.     LOS: Time 9/3/202212:57 PM

## 2020-10-28 NOTE — Progress Notes (Signed)
Patient ID: [name], female   DOB: Jul 25, 1970, 50 y.o.   MRN: [id number] [facility] PROGRESS NOTE  [name] QIW:[id number] DOB: [birth year] DOA: 10/15/2020 PCP: [name], NP  HPI/Subjective: Patient feels okay.  Had severe pain when the podiatrist changed the dressing today.  She is concerned on how her foot is.  Objective: Vitals:   10/28/20 1508 10/28/20 1633  BP: 106/60 (!) 96/57  Pulse: 94 90  Resp:  18  Temp:  98.1 F (36.7 C)  SpO2:      Intake/Output Summary (Last 24 hours) at 10/28/2020 1642 Last data filed at 10/28/2020 1215 Gross per 24 hour  Intake 390 ml  Output 3002 ml  Net -2612 ml   Filed Weights   10/15/20 1905 10/18/20 0928  Weight: 126.1 kg 126.1 kg    ROS: Review of Systems  Respiratory:  Positive for shortness of breath.   Cardiovascular:  Negative for chest pain.  Gastrointestinal:  Negative for abdominal pain, nausea and vomiting.  Musculoskeletal:  Positive for joint pain.  Exam: Physical Exam HENT:     Head: Normocephalic.     Mouth/Throat:     Pharynx: No oropharyngeal exudate.  Eyes:     General: Lids are normal.     Conjunctiva/sclera: Conjunctivae normal.  Cardiovascular:     Rate and Rhythm: Normal rate and regular rhythm.     Heart sounds: Normal heart sounds, S1 normal and S2 normal.  Pulmonary:     Breath sounds: Examination of the right-lower field reveals decreased breath sounds. Examination of the left-lower field reveals decreased breath sounds. Decreased breath sounds present. No wheezing, rhonchi or rales.  Abdominal:     Palpations: Abdomen is soft.     Tenderness: There is no abdominal tenderness.  Musculoskeletal:     Right lower leg: No swelling.     Left lower leg: No swelling.  Skin:    General: Skin is warm.     Comments: Right foot and ankle covered.  Few of the toes a little darker in coloration.  Neurological:     Mental Status: She is alert and oriented to person, place, and time.       Scheduled Meds:  vitamin C  500 mg Oral Daily   aspirin EC  81 mg Oral Daily   Chlorhexidine Gluconate Cloth  6 each Topical Daily   cholecalciferol  4,000 Units Oral Weekly   cinacalcet  60 mg Oral Daily   docusate sodium  100 mg Oral Daily   feeding supplement (NEPRO CARB STEADY)  237 mL Oral BID BM   ferric citrate  210 mg Oral TID WC   heparin  5,000 Units Subcutaneous Q8H   insulin aspart  0-5 Units Subcutaneous QHS   insulin aspart  0-9 Units Subcutaneous TID WC   insulin aspart  2 Units Subcutaneous TID WC   insulin glargine-yfgn  36 Units Subcutaneous QHS   ipratropium-albuterol  3 mL Nebulization TID   linaclotide  290 mcg Oral q morning   loratadine  10 mg Oral Daily   mometasone-formoterol  2 puff Inhalation BID   multivitamin  1 tablet Oral QHS   pantoprazole  40 mg Oral Daily   sodium chloride flush  3 mL Intravenous Q12H   Continuous Infusions:  sodium chloride 0 mL/hr at 10/22/20 1200   piperacillin-tazobactam (ZOSYN)  IV 2.25 g (10/28/20 1612)    Assessment/Plan:  Diabetic foot ulcers with gangrene of the fifth toe and osteomyelitis.  8/27  the patient had amputation of the fifth ray and I&D of the foot.  Patient had a wound washout of the foot on 10/26/20.  Dr. Posey Pronto from podiatry changed the dressing today and concerned about gangrene of the foot.  Dr. Posey Pronto still recommends amputation at this point.  Patient wants to watch things over the next few days and see how things progress.  Continue IV Zosyn. Peripheral vascular disease.  Angiogram 8/24 showed popliteal artery mid lesion 30% or less.  Continue aspirin Type 2 diabetes mellitus with end-stage renal disease.  Hemodialysis Tuesday Thursday Saturday.  Patient on glargine insulin and sliding scale. Anemia of chronic disease.  Last hemoglobin 9.9 Hyponatremia.  Managed with hemodialysis Chronic hypoxic respiratory failure.  Baseline oxygen on 3 to 4 L. Morbid obesity Bilateral lesions bilateral lower  extremity could be calciphylaxis        Code Status:     Code Status Orders  (From admission, onward)           Start     Ordered   10/15/20 2114  Full code  Continuous        10/15/20 2119           Code Status History     Date Active Date Inactive Code Status Order ID Comments User Context   03/24/2017 1953 04/28/2017 1906 Full Code [id number]  Hillary Bow, MD ED   03/03/2017 1420 03/10/2017 2131 Full Code [id number]  Fritzi Mandes, MD ED   01/07/2017 1725 01/10/2017 1900 Full Code [id number]  Demetrios Loll, MD Inpatient   11/08/2016 0450 11/16/2016 1717 Full Code [id number]  Hugelmeyer, Alexis, DO Inpatient   06/25/2016 1447 06/29/2016 1815 Full Code [id number]  Hillary Bow, MD ED   08/09/2015 1757 08/13/2015 1721 Full Code [id number]  Bettey Costa, MD Inpatient   12/31/2014 2025 01/03/2015 1749 Full Code [id number]  Hower, Aaron Mose, MD ED   12/15/2014 1154 12/19/2014 1657 Full Code [id number]  Hillary Bow, MD ED      Family Communication: Spoke with husband on the phone at [phone] Disposition Plan: Status is: Inpatient  Dispo: The patient is from: Home              Anticipated d/c is to: Home              Patient currently on IV antibiotics.  Podiatry concerned about the patient's foot.  Patient still contemplating her next steps.   Difficult to place patient.  No.  Antibiotics: Zosyn  Time spent: 26 minutes  [name]

## 2020-10-28 NOTE — Progress Notes (Signed)
PT Cancellation Note  Patient Details Name: [name] MRN: [id number] DOB: 10-Sep-1970   Cancelled Treatment:     PT attempt. Pt currently off floor for HD. Will return later and continue to follow per current POC.   Willette Pa 10/28/2020, 9:22 AM

## 2020-10-29 DIAGNOSIS — E1122 Type 2 diabetes mellitus with diabetic chronic kidney disease: Secondary | ICD-10-CM | POA: Diagnosis not present

## 2020-10-29 DIAGNOSIS — I96 Gangrene, not elsewhere classified: Secondary | ICD-10-CM | POA: Diagnosis not present

## 2020-10-29 DIAGNOSIS — E11621 Type 2 diabetes mellitus with foot ulcer: Secondary | ICD-10-CM | POA: Diagnosis not present

## 2020-10-29 DIAGNOSIS — I739 Peripheral vascular disease, unspecified: Secondary | ICD-10-CM | POA: Diagnosis not present

## 2020-10-29 LAB — GLUCOSE, CAPILLARY
Glucose-Capillary: 241 mg/dL — ABNORMAL HIGH (ref 70–99)
Glucose-Capillary: 186 mg/dL — ABNORMAL HIGH (ref 70–99)
Glucose-Capillary: 233 mg/dL — ABNORMAL HIGH (ref 70–99)
Glucose-Capillary: 275 mg/dL — ABNORMAL HIGH (ref 70–99)

## 2020-10-29 LAB — CBC
HCT: 25.3 % — ABNORMAL LOW (ref 36.0–46.0)
Hemoglobin: 8.1 g/dL — ABNORMAL LOW (ref 12.0–15.0)
MCH: 30.6 pg (ref 26.0–34.0)
MCHC: 32 g/dL (ref 30.0–36.0)
MCV: 95.5 fL (ref 80.0–100.0)
Platelets: 341 10*3/uL (ref 150–400)
RBC: 2.65 MIL/uL — ABNORMAL LOW (ref 3.87–5.11)
RDW: 15.2 % (ref 11.5–15.5)
WBC: 19.8 10*3/uL — ABNORMAL HIGH (ref 4.0–10.5)
nRBC: 0 % (ref 0.0–0.2)

## 2020-10-29 LAB — PHOSPHORUS: Phosphorus: 5.6 mg/dL — ABNORMAL HIGH (ref 2.5–4.6)

## 2020-10-29 LAB — BASIC METABOLIC PANEL
Anion gap: 15 (ref 5–15)
BUN: 43 mg/dL — ABNORMAL HIGH (ref 6–20)
CO2: 23 mmol/L (ref 22–32)
Calcium: 6.9 mg/dL — ABNORMAL LOW (ref 8.9–10.3)
Chloride: 91 mmol/L — ABNORMAL LOW (ref 98–111)
Creatinine, Ser: 7.04 mg/dL — ABNORMAL HIGH (ref 0.44–1.00)
GFR, Estimated: 7 mL/min — ABNORMAL LOW (ref >60)
Glucose, Bld: 277 mg/dL — ABNORMAL HIGH (ref 70–99)
Potassium: 3.8 mmol/L (ref 3.5–5.1)
Sodium: 129 mmol/L — ABNORMAL LOW (ref 135–145)

## 2020-10-29 MED ORDER — OXYCODONE HCL 5 MG PO TABS
5.0000 mg | ORAL_TABLET | ORAL | Status: DC | PRN
Start: 2020-10-29 — End: 2020-11-10

## 2020-10-29 MED ORDER — SODIUM CHLORIDE 0.9 % IV SOLN
1.0000 g | INTRAVENOUS | Status: DC
  Administered 2020-10-31: 1 g via INTRAVENOUS
  Filled 2020-10-29 (×2): qty 1

## 2020-10-29 MED ORDER — SODIUM CHLORIDE 0.9 % IV SOLN
1.0000 g | Freq: Once | INTRAVENOUS | Status: AC
  Administered 2020-10-29: 1 g via INTRAVENOUS
  Filled 2020-10-29: qty 1

## 2020-10-29 MED ORDER — OXYCODONE HCL 5 MG PO TABS
10.0000 mg | ORAL_TABLET | ORAL | Status: DC | PRN
  Administered 2020-10-29 – 2020-11-10 (×35): 10 mg via ORAL
  Filled 2020-10-29 (×36): qty 2

## 2020-10-29 NOTE — Progress Notes (Signed)
Physical Therapy Treatment Patient Details Name: [name] MRN: [id number] DOB: [birth year] Today's Date: 10/29/2020    History of Present Illness Pt is a 50 y.o. female presenting to hospital 8/21 with chest pain, fever, fatigue, and generalized malaise; also foul smell from R great toe (chronic ulcer).  Pt admitted with diabetic foot ulcers with gangrene of the 5th toe and osteomyelitis.  S/p R LE angio 8/24; s/p partial R foot 5th toe amp 8/27; and s/p I&D with revisional debridement R foot 9/1.  Per PT order 9/2: pt [place] R foot.  PMH includes DM, ESRD, anxiety, CHF, CKD, PNA, chronic respiratory failure with hypoxia and hypercapnia.    PT Comments    Pt received supine in bed agreeable to PT services. Pt aware she is [place] on RLE. Reports her husband had to physically lift her yesterday to stand up but reports she did maintain RLE precautions during. Unable to state how long she stayed standing but it appears this was a maxA transfer from husband.  Maintaining supervision for bed mobility but requires bed rails and HOB elevated. Pt educated on use of sliding board if unable to successfully progress to standing with BRW and then to stand pivot transfers. X3 STS to BRW with modA+1 from PT with no success. Pt still attempting to utilize R foot although aware not to due to weakness. Pt denying attempts with +2 assist due to not having that capability at home, and denying attempts of sliding board transfer due to fear of falling on the floor. Pt able to reposition in bed with no physical assist back to supine with minA for RLE repositioning. Pt does report having a knee scooter and a w/c but this still requires pt to stand or use sliding board to transfer which she is unable to do at this time and was not willing to attempt today (I.e. sliding board). STR will be necessary at this time in order for pt to improve BUE and LLE strength and ability to transfer safely with LRAD to at least perform OOB  mobility with knee scooter or w/c.    Follow Up Recommendations  SNF     Equipment Recommendations  Wheelchair (measurements PT);Wheelchair cushion (measurements PT);Other (comment) (sliding board. Has access to hospital bed.)    Recommendations for Other Services       Precautions / Restrictions Precautions Precautions: Fall Restrictions Weight Bearing Restrictions: Yes Other Position/Activity Restrictions: [place] R foot    Mobility  Bed Mobility Overal bed mobility: Needs Assistance Bed Mobility: Supine to Sit;Sit to Supine     Supine to sit: Supervision;HOB elevated Sit to supine: Supervision;HOB elevated     Patient Response: Cooperative  Transfers Overall transfer level: Needs assistance Equipment used: Rolling walker (2 wheeled) Transfers: Sit to/from Stand;Lateral/Scoot Transfers Sit to Stand: From elevated surface (Still unable to stand with 1 assist. Denying 2 person assist b/c does not have that capability at home.)        Lateral/Scoot Transfers: Supervision;From elevated surface General transfer comment: Pt denying attempts in slide board transfer at this time. Reports she does not feel safe and nervous on falling to floor. x3 attempts to stand with maintaining RLE NWB'ing precautions with no success.  Ambulation/Gait             General Gait Details: unable to stand to attempt   Stairs             Wheelchair Mobility    Modified Rankin (Stroke Patients Only)  Balance Overall balance assessment: Needs assistance Sitting-balance support: No upper extremity supported;Feet unsupported Sitting balance-Leahy Scale: Good         Standing balance comment: Unable to perform                            Cognition Arousal/Alertness: Awake/alert Behavior During Therapy: WFL for tasks assessed/performed Overall Cognitive Status: Within Functional Limits for tasks assessed                                  General Comments: Reports she gets anxious. Currently not during today's session.      Exercises      General Comments General comments (skin integrity, edema, etc.): R foot dressing donned and in place.      Pertinent Vitals/Pain Pain Assessment: Faces Faces Pain Scale: Hurts a little bit Pain Location: R foot Pain Descriptors / Indicators: Tingling Pain Intervention(s): Limited activity within patient's tolerance;Repositioned;Monitored during session    Home Living                      Prior Function            PT Goals (current goals can now be found in the care plan section) Acute Rehab PT Goals Patient Stated Goal: to improve mobility PT Goal Formulation: With patient Time For Goal Achievement: 11/10/20 Potential to Achieve Goals: Fair Progress towards PT goals: Not progressing toward goals - comment    Frequency    7X/week      PT Plan      Co-evaluation              AM-PAC PT "6 Clicks" Mobility   Outcome Measure  Help needed turning from your back to your side while in a flat bed without using bedrails?: None Help needed moving from lying on your back to sitting on the side of a flat bed without using bedrails?: A Little Help needed moving to and from a bed to a chair (including a wheelchair)?: Total Help needed standing up from a chair using your arms (e.g., wheelchair or bedside chair)?: Total Help needed to walk in hospital room?: Total Help needed climbing 3-5 steps with a railing? : Total 6 Click Score: 11    End of Session Equipment Utilized During Treatment: Gait belt;Oxygen Activity Tolerance: Patient tolerated treatment well Patient left: in bed;with call bell/phone within reach;with bed alarm set;with nursing/sitter in room Nurse Communication: Mobility status;Precautions;Weight bearing status PT Visit Diagnosis: Other abnormalities of gait and mobility (R26.89);Muscle weakness (generalized) (M62.81);Pain Pain -  Right/Left: Right Pain - part of body: Ankle and joints of foot     Time: 1107-1140 PT Time Calculation (min) (ACUTE ONLY): 33 min  Charges:  $Therapeutic Activity: 23-37 mins                    Catalina Salasar M. Fairly IV, PT, DPT Physical Therapist- [facility]  10/29/2020, 12:04 PM

## 2020-10-29 NOTE — Progress Notes (Signed)
[facility]  ROUNDING NOTE   Subjective:   [name] was admitted to [facility] on 10/15/2020 for [facility]) [I96] Diabetic foot ulcer ([place]) [E11.621, L97.509] Decreased pedal pulses [R09.89] Sepsis, due to unspecified organism, unspecified whether acute organ dysfunction present [facility]) [A41.9]  Last hemodialysis treatment was Saturday, 8/20. She was having hypotension on her treatment and she took midodrine to complete her treatment.   Patient seen laying in bed Alert and oriented Concerned about wound healing and care plan. States she does not want to have more of her foot removed if there are other options available. She would lie a second opinion before proceeding with further amputation. Also concerned about mobility and transfers.  Objective:  Vital signs in last 24 hours:  Temp:  [98.1 F (36.7 C)-98.6 F (37 C)] 98.6 F (37 C) (09/04 0726) Pulse Rate:  [60-106] 87 (09/04 0726) Resp:  [14-22] 19 (09/04 0726) BP: (90-124)/(37-81) 113/72 (09/04 0726) SpO2:  [95 %-100 %] 100 % (09/04 0749)  Weight change:  Filed Weights   10/15/20 1905 10/18/20 0928  Weight: 126.1 kg 126.1 kg    Intake/Output: I/O last 3 completed shifts: In: 71 [P.O.:240; IV Piggyback:250] Out: 3002 [Other:3002]   Intake/Output this shift:  Total I/O In: 3 [I.V.:3] Out: 1 [Stool:1]  Physical Exam: General: NAD, laying in bed  Head: Normocephalic, atraumatic. Moist oral mucosal membranes  Eyes: Anicteric  Lungs:  Clear to auscultation, O2 2L  Heart: Regular rate and rhythm  Abdomen:  Soft, nontender  Extremities:  trace peripheral edema. Right foot fifth toe amputation on 10/21/20, ace wrapped  Neurologic: Nonfocal, moving all four extremities  Skin: No lesions  Access: Left AVF    Basic Metabolic Panel: Recent Labs  Lab 10/23/20 0456 10/24/20 0527 10/25/20 0410 10/26/20 0439 10/26/20 1619 10/29/20 0523  NA 126* 123* 126* 124* 130* 129*  K 4.5 5.6* 3.9 4.4 4.0  3.8  CL 88* 85* 87* 86* 95* 91*  CO2 23 18* 23 21*  --  23  GLUCOSE 131* 198* 199* 257* 143* 277*  BUN 53* 68* 51* 67* 36* 43*  CREATININE 7.09* 8.43* 6.74* 8.01* 6.20* 7.04*  CALCIUM 7.3* 7.0* 7.0* 6.9*  --  6.9*  MG  --   --   --  2.0  --   --   PHOS  --   --   --   --   --  5.6*     Liver Function Tests: Recent Labs  Lab 10/23/20 0456 10/24/20 0527 10/25/20 0410  AST 88* 346* 561*  ALT 31 91* 167*  ALKPHOS 474* 661* 661*  BILITOT 1.1 1.2 1.2  PROT 6.8 7.0 6.8  ALBUMIN 2.4* 2.5* 2.3*    No results for input(s): LIPASE, AMYLASE in the last 168 hours. No results for input(s): AMMONIA in the last 168 hours.  CBC: Recent Labs  Lab 10/23/20 0456 10/24/20 0527 10/25/20 0410 10/26/20 0439 10/26/20 1619 10/29/20 0523  WBC 30.7* 28.2* 25.9* 25.4*  --  19.8*  HGB 8.2* 8.1* 7.9* 8.4* 9.9* 8.1*  HCT 24.7* 24.2* 24.1* 26.0* 29.0* 25.3*  MCV 91.8 94.9 94.5 92.5  --  95.5  PLT 400 398 389 390  --  341     Cardiac Enzymes: No results for input(s): CKTOTAL, CKMB, CKMBINDEX, TROPONINI in the last 168 hours.  BNP: Invalid input(s): POCBNP  CBG: Recent Labs  Lab 10/27/20 1549 10/28/20 1316 10/28/20 1626 10/28/20 2114 10/29/20 0727  GLUCAP 220* 162* 277* 195* 241*  Microbiology: Results for orders placed or performed during the hospital encounter of 10/15/20  Blood Culture (routine x 2)     Status: None   Collection Time: 10/15/20  8:03 PM   Specimen: BLOOD  Result Value Ref Range Status   Specimen Description BLOOD BLOOD RIGHT FOREARM  Final   Special Requests   Final    BOTTLES DRAWN AEROBIC AND ANAEROBIC Blood Culture adequate volume   Culture   Final    NO GROWTH 5 DAYS Performed at [facility], [address] [place], [place] [address]    Report Status 10/20/2020 FINAL  Final  Resp Panel by RT-PCR (Flu A&B, Covid) Nasopharyngeal Swab     Status: None   Collection Time: 10/15/20  8:26 PM   Specimen: Nasopharyngeal Swab;  Nasopharyngeal(NP) swabs in vial transport medium  Result Value Ref Range Status   SARS Coronavirus 2 by RT PCR NEGATIVE NEGATIVE Final    Comment: (NOTE) SARS-CoV-2 target nucleic acids are NOT DETECTED.  The SARS-CoV-2 RNA is generally detectable in upper respiratory specimens during the acute phase of infection. The lowest concentration of SARS-CoV-2 viral copies this assay can detect is 138 copies/mL. A negative result does not preclude SARS-Cov-2 infection and should not be used as the sole basis for treatment or other patient management decisions. A negative result may occur with  improper specimen collection/handling, submission of specimen other than nasopharyngeal swab, presence of viral mutation(s) within the areas targeted by this assay, and inadequate number of viral copies(<138 copies/mL). A negative result must be combined with clinical observations, patient history, and epidemiological information. The expected result is Negative.  Fact Sheet for Patients:  EntrepreneurPulse.com.au  Fact Sheet for Healthcare Providers:  IncredibleEmployment.be  This test is no t yet approved or cleared by the [place] FDA and  has been authorized for detection and/or diagnosis of SARS-CoV-2 by FDA under an Emergency Use Authorization (EUA). This EUA will remain  in effect (meaning this test can be used) for the duration of the COVID-19 declaration under Section 564(b)(1) of the Act, 21 U.S.C.section 360bbb-3(b)(1), unless the authorization is terminated  or revoked sooner.       Influenza A by PCR NEGATIVE NEGATIVE Final   Influenza B by PCR NEGATIVE NEGATIVE Final    Comment: (NOTE) The Xpert Xpress SARS-CoV-2/FLU/RSV plus assay is intended as an aid in the diagnosis of influenza from Nasopharyngeal swab specimens and should not be used as a sole basis for treatment. Nasal washings and aspirates are unacceptable for Xpert Xpress  SARS-CoV-2/FLU/RSV testing.  Fact Sheet for Patients: EntrepreneurPulse.com.au  Fact Sheet for Healthcare Providers: IncredibleEmployment.be  This test is not yet approved or cleared by the [place] FDA and has been authorized for detection and/or diagnosis of SARS-CoV-2 by FDA under an Emergency Use Authorization (EUA). This EUA will remain in effect (meaning this test can be used) for the duration of the COVID-19 declaration under Section 564(b)(1) of the Act, 21 U.S.C. section 360bbb-3(b)(1), unless the authorization is terminated or revoked.  Performed at [facility], [address] [place], [place] [address]   Blood Culture (routine x 2)     Status: None   Collection Time: 10/15/20  8:26 PM   Specimen: BLOOD  Result Value Ref Range Status   Specimen Description BLOOD RIGHT ANTECUBITAL  Final   Special Requests   Final    BOTTLES DRAWN AEROBIC AND ANAEROBIC Blood Culture adequate volume   Culture   Final    NO  GROWTH 5 DAYS Performed at [facility], [address] [place], [place] [address]    Report Status 10/20/2020 FINAL  Final  Aerobic Culture w Gram Stain (superficial specimen)     Status: None   Collection Time: 10/17/20  8:52 PM   Specimen: Wound  Result Value Ref Range Status   Specimen Description WOUND  Final   Special Requests NONE  Final   Gram Stain   Final    RARE SQUAMOUS EPITHELIAL CELLS PRESENT FEW WBC PRESENT, PREDOMINANTLY MONONUCLEAR ABUNDANT GRAM VARIABLE ROD FEW GRAM POSITIVE COCCI    Culture   Final    MODERATE ENTEROCOCCUS FAECALIS MODERATE KLEBSIELLA OXYTOCA ABUNDANT DIPHTHEROIDS(CORYNEBACTERIUM SPECIES) Standardized susceptibility testing for this organism is not available.    Report Status 10/20/2020 FINAL  Final   Organism ID, Bacteria ENTEROCOCCUS FAECALIS  Final   Organism ID, Bacteria KLEBSIELLA OXYTOCA  Final      Susceptibility   Enterococcus faecalis - MIC*     AMPICILLIN <=2 SENSITIVE Sensitive     VANCOMYCIN 1 SENSITIVE Sensitive     GENTAMICIN SYNERGY RESISTANT Resistant     * MODERATE ENTEROCOCCUS FAECALIS   Klebsiella oxytoca - MIC*    AMPICILLIN >=32 RESISTANT Resistant     CEFAZOLIN <=4 SENSITIVE Sensitive     CEFEPIME <=0.12 SENSITIVE Sensitive     CEFTAZIDIME <=1 SENSITIVE Sensitive     CEFTRIAXONE <=0.25 SENSITIVE Sensitive     CIPROFLOXACIN <=0.25 SENSITIVE Sensitive     GENTAMICIN <=1 SENSITIVE Sensitive     IMIPENEM <=0.25 SENSITIVE Sensitive     TRIMETH/SULFA <=20 SENSITIVE Sensitive     AMPICILLIN/SULBACTAM 8 SENSITIVE Sensitive     PIP/TAZO <=4 SENSITIVE Sensitive     * MODERATE KLEBSIELLA OXYTOCA  Aerobic/Anaerobic Culture w Gram Stain (surgical/deep wound)     Status: None   Collection Time: 10/21/20  3:07 PM   Specimen: PATH Digit amputation; Tissue  Result Value Ref Range Status   Specimen Description   Final    FOOT  RIGHT 5TH DIGIT AMPUTATION Performed at [facility], [address], [place], [place] [address]    Special Requests   Final    NONE Performed at [facility], [place], [place] [address]    Gram Stain   Final    FEW SQUAMOUS EPITHELIAL CELLS PRESENT FEW WBC SEEN FEW GRAM POSITIVE COCCI    Culture   Final    FEW STREPTOCOCCUS ANGINOSIS NO ANAEROBES ISOLATED Performed at [facility], [place] [address] [place], [place] [address]    Report Status 10/27/2020 FINAL  Final   Organism ID, Bacteria STREPTOCOCCUS ANGINOSIS  Final      Susceptibility   Streptococcus anginosis - MIC*    PENICILLIN 0.12 SENSITIVE Sensitive     CEFTRIAXONE 0.5 SENSITIVE Sensitive     ERYTHROMYCIN 4 RESISTANT Resistant     LEVOFLOXACIN 0.5 SENSITIVE Sensitive     VANCOMYCIN 0.5 SENSITIVE Sensitive     * FEW STREPTOCOCCUS ANGINOSIS  Aerobic/Anaerobic Culture w Gram Stain (surgical/deep wound)     Status: None (Preliminary result)   Collection Time: 10/26/20  5:35 PM   Specimen:  PATH Other; Tissue  Result Value Ref Range Status   Specimen Description   Final    FOOT RIGHT Performed at [facility], [address], [place], [place] [address]    Special Requests   Final    NONE Performed at [facility], [address] [place], [place] [address]  Gram Stain   Final    NO WBC SEEN NO ORGANISMS SEEN Performed at [facility], [place] [address], [place], [place] [address]    Culture   Final    CULTURE REINCUBATED FOR BETTER GROWTH NO ANAEROBES ISOLATED; CULTURE IN PROGRESS FOR 5 DAYS    Report Status PENDING  Incomplete    Coagulation Studies: No results for input(s): LABPROT, INR in the last 72 hours.   Urinalysis: No results for input(s): COLORURINE, LABSPEC, PHURINE, GLUCOSEU, HGBUR, BILIRUBINUR, KETONESUR, PROTEINUR, UROBILINOGEN, NITRITE, LEUKOCYTESUR in the last 72 hours.  Invalid input(s): APPERANCEUR    Imaging: No results found.   Medications:    sodium chloride 0 mL/hr at 10/22/20 1200   piperacillin-tazobactam (ZOSYN)  IV 2.25 g (10/29/20 0730)    vitamin C  500 mg Oral Daily   aspirin EC  81 mg Oral Daily   Chlorhexidine Gluconate Cloth  6 each Topical Daily   cholecalciferol  4,000 Units Oral Weekly   cinacalcet  60 mg Oral Daily   docusate sodium  100 mg Oral Daily   feeding supplement (NEPRO CARB STEADY)  237 mL Oral BID BM   ferric citrate  210 mg Oral TID WC   heparin  5,000 Units Subcutaneous Q8H   insulin aspart  0-5 Units Subcutaneous QHS   insulin aspart  0-9 Units Subcutaneous TID WC   insulin aspart  2 Units Subcutaneous TID WC   insulin glargine-yfgn  36 Units Subcutaneous QHS   ipratropium-albuterol  3 mL Nebulization TID   linaclotide  290 mcg Oral q morning   loratadine  10 mg Oral Daily   mometasone-formoterol  2 puff Inhalation BID   multivitamin  1 tablet Oral QHS   pantoprazole  40 mg Oral Daily   sodium chloride flush  3 mL Intravenous Q12H   sodium chloride, acetaminophen,  albuterol, albuterol, ALPRAZolam, alum & mag hydroxide-simeth, calcium carbonate, dextromethorphan-guaiFENesin, diphenhydrAMINE, hydrALAZINE, HYDROmorphone (DILAUDID) injection, lactulose, metoprolol tartrate, ondansetron (ZOFRAN) IV, ondansetron, oxyCODONE, phenol, polyethylene glycol, sodium chloride flush, traZODone  Assessment/ Plan:  [name] is a 50 y.o. black female with end stage renal disease on hemodialysis, diabetes mellitus type II, hypertension, congestive heart failure, COPD, who is admitted to [facility] on 10/15/2020 for [facility]) [I96] Diabetic foot ulcer ([place]) [I45.809, L97.509] Decreased pedal pulses [R09.89] Sepsis, due to unspecified organism, unspecified whether acute organ dysfunction present [facility]) [A41.9]  [facility] Left AVF 127.5kg  End Stage Renal Disease: last dialysis was 10/14/20. Received dialysis yesterday, UF 3L removed.  Next treatment scheduled for Tuesday.Patient continues to feel she needs more aggressive fluid removal with treatment. Offered to provide an extra treatment tomorrow. Patient agrees to dialyze tomorrow for 2 hrs with UF goal 2L as tolerated.    Hypotension: secondary to infection. Holding home regimen torsemide, losartan, clonidine.98/52. Midodrine possibly held due to vasoconstriction and this may hinder recent amputation procedure. Will monitor for need. BP 113/72   Anemia with chronic kidney disease: hemoglobin 8.1. Will increase EPO with next HD treatment.   Secondary Hyperparathyroidism with hyeprphosphatemia: there is risk for calciphylaxis. Outpatient labs on 8/9: phos 10.6, calcium 8.2, PTH 1456.  - cinacalcet and sevelamer with meals - Calcium 6.9, Phosphorus 5.6. Both not at target - Continue current treatment  Diabetes mellitus type II with chronic kidney disease: insulin dependent. Complicated with diabetic foot ulcer. History of poor control. Hemoglobin A1c 9.2% on 09/19/2020.  - Appreciate podiatry and vascular  input.  -Currently receiving  Zosyn - Vascular performed right lower angiogram. 30% mild lesion found popliteal artery. Deemed adequate for wound healing.  - Podiatry performed right foot, partial fifth toe amputation on 10/21/20. Dressing changes determine need for further amputation. Patient requesting second opinion.    6.    Hyponatremia: Improved to 129. Will continue to monitor. Patient states she has been eating more ice chips, instructed to limit this consumption.    LOS: [place] 9/4/202210:25 AM

## 2020-10-29 NOTE — Progress Notes (Signed)
A consult was placed to the hospital's IV Nurse for new iv access; pt is limited to her right arm only for all labs, ivs; has had graft/fistula in her left arm for approx 3 years;  right arm assessed thoroughly with ultrasound; veins bifurcate and do not compress easily ; scar tissue is noted; attempted x 2; pt requested to "just try the one in the bend":  catheter will only thread part way into the vein with a blood return; flushes w NS but is not entirely threaded into the vein; Sam, RN, pt'[s nurse has been at bedside and is aware of access issues;  spoke with Levada Dy, RN, in charge , in the ED; she will send another RN to assess the pt for iv access.  Suggest some type of central access for this pt.

## 2020-10-29 NOTE — Progress Notes (Signed)
Patient ID: [name], female   DOB: 09-Jun-1970, 50 y.o.   MRN: [id number] [facility] PROGRESS NOTE  TYLEIGH MAHN [id number] DOB: 31-Jul-1970 DOA: 10/15/2020 PCP: [name], NP  HPI/Subjective: Patient feeling okay.  Had pain with her dressing change this morning but not as much as the day before.  She is okay with staying here at this hospital and following with the physicians here.  Objective: Vitals:   10/29/20 1140 10/29/20 1520  BP: 110/78 103/85  Pulse: 89 69  Resp: 18 18  Temp: 99 F (37.2 C) 98.7 F (37.1 C)  SpO2: 100% 92%    Intake/Output Summary (Last 24 hours) at 10/29/2020 1614 Last data filed at 10/29/2020 1001 Gross per 24 hour  Intake 103 ml  Output 1 ml  Net 102 ml   Filed Weights   10/15/20 1905 10/18/20 0928  Weight: 126.1 kg 126.1 kg    ROS: Review of Systems  Respiratory:  Negative for shortness of breath.   Cardiovascular:  Negative for chest pain.  Gastrointestinal:  Negative for abdominal pain.  Musculoskeletal:  Positive for joint pain.  Exam: Physical Exam HENT:     Head: Normocephalic.     Mouth/Throat:     Pharynx: No oropharyngeal exudate.  Eyes:     General: Lids are normal.     Conjunctiva/sclera: Conjunctivae normal.  Cardiovascular:     Rate and Rhythm: Normal rate and regular rhythm.     Heart sounds: Normal heart sounds, S1 normal and S2 normal.  Pulmonary:     Breath sounds: Examination of the right-lower field reveals decreased breath sounds. Examination of the left-lower field reveals decreased breath sounds. Decreased breath sounds present. No wheezing, rhonchi or rales.  Abdominal:     Palpations: Abdomen is soft.     Tenderness: There is no abdominal tenderness.  Musculoskeletal:     Right lower leg: Swelling present.     Left lower leg: Swelling present.  Skin:    General: Skin is warm.     Comments: Right foot covered.  Toes on right foot darker in color.,  Calciphylaxis looking lesions bilateral  lower extremities.  Neurological:     Mental Status: She is alert and oriented to person, place, and time.      Scheduled Meds:  vitamin C  500 mg Oral Daily   aspirin EC  81 mg Oral Daily   Chlorhexidine Gluconate Cloth  6 each Topical Daily   cholecalciferol  4,000 Units Oral Weekly   cinacalcet  60 mg Oral Daily   docusate sodium  100 mg Oral Daily   feeding supplement (NEPRO CARB STEADY)  237 mL Oral BID BM   ferric citrate  210 mg Oral TID WC   heparin  5,000 Units Subcutaneous Q8H   insulin aspart  0-5 Units Subcutaneous QHS   insulin aspart  0-9 Units Subcutaneous TID WC   insulin aspart  2 Units Subcutaneous TID WC   insulin glargine-yfgn  36 Units Subcutaneous QHS   ipratropium-albuterol  3 mL Nebulization TID   linaclotide  290 mcg Oral q morning   loratadine  10 mg Oral Daily   mometasone-formoterol  2 puff Inhalation BID   multivitamin  1 tablet Oral QHS   pantoprazole  40 mg Oral Daily   sodium chloride flush  3 mL Intravenous Q12H   Continuous Infusions:  sodium chloride 0 mL/hr at 10/22/20 1200    Assessment/Plan:  Diabetic foot ulcers with gangrene of the fifth  toe and osteomyelitis.  On 8/27 the patient had amputation of the fifth ray and I&D of the abscess right foot.  On 9/1 had a washout procedure.  Continue close monitoring.  Patient is okay with our recommendations at this hospital does not want transfer at this point.  Patient received a dose of Zosyn now.  With poor IV access switching Zosyn over to ceftazidime which can be dosed with dialysis. Peripheral vascular disease.  Angiogram on 824 showed popliteal artery mid lesion 30% or less on aspirin. Type 2 diabetes mellitus with end-stage renal disease.  Hemodialysis Tuesday Thursday and Saturday.  Patient to have an extra session of dialysis on Monday and ceftazidime will be given then. Patient on glargine insulin and sliding scale Anemia of chronic disease.  Hemoglobin fluctuates last hemoglobin  8.1. Hyponatremia managed with hemodialysis Chronic hypoxic respiratory failure on baseline 3 to 4 L of oxygen Morbid obesity Looks like calciphylaxis lesions bilateral lower extremities     Code Status:     Code Status Orders  (From admission, onward)           Start     Ordered   10/15/20 2114  Full code  Continuous        10/15/20 2119           Code Status History     Date Active Date Inactive Code Status Order ID Comments User Context   03/24/2017 1953 04/28/2017 1906 Full Code [id number]  Hillary Bow, MD ED   03/03/2017 1420 03/10/2017 2131 Full Code [id number]  Fritzi Mandes, MD ED   01/07/2017 1725 01/10/2017 1900 Full Code [id number]  Demetrios Loll, MD Inpatient   11/08/2016 0450 11/16/2016 1717 Full Code [id number]  Hugelmeyer, Alexis, DO Inpatient   06/25/2016 1447 06/29/2016 1815 Full Code [id number]  Hillary Bow, MD ED   08/09/2015 1757 08/13/2015 1721 Full Code [id number]  Bettey Costa, MD Inpatient   12/31/2014 2025 01/03/2015 1749 Full Code [id number]  Hower, Aaron Mose, MD ED   12/15/2014 1154 12/19/2014 1657 Full Code [id number]  Hillary Bow, MD ED      Family Communication: Husband yesterday Disposition Plan: Status is: Inpatient  Dispo: The patient is from: Home              Anticipated d/c is to: Home versus rehab              Patient currently being followed for wound infection.  Continue IV antibiotics.   Difficult to place patient.  No.  Consultants: Podiatry Vascular surgery Nephrology Infectious disease  Antibiotics: With poor IV access we will change antibiotics to ceftazidime which can be dosed with dialysis  Time spent: 27 minutes  [place]

## 2020-10-29 NOTE — Plan of Care (Signed)
Patient alert and oriented x4, complains on on-going surgical pain to right foot, relieved with prn pain medications. Vitals stable, no respiratory distress on 3 liters via nasal canula. Remains on cardiac monitoring, dressing changed to right foot. Will continue to monitor.  Problem: Education: Goal: Knowledge of General Education information will improve Description: Including pain rating scale, medication(s)/side effects and non-pharmacologic comfort measures Outcome: Progressing   Problem: Activity: Goal: Risk for activity intolerance will decrease Outcome: Progressing   Problem: Pain Managment: Goal: General experience of comfort will improve Outcome: Progressing   Problem: Safety: Goal: Ability to remain free from injury will improve Outcome: Progressing

## 2020-10-29 NOTE — Progress Notes (Addendum)
Pharmacy Antibiotic Note  [name] is a 50 y.o. female admitted on 10/15/2020 with Diabetic foot infection with gangrene and osteomyelitis of 5th toe. S/P amputation on 8/27 and wash out and debridement 10/26/20. Patient has PMH of ESRD on HD. Per provider, patient keeps losing IV access and needs medication that can be given during dialysis. ID has recommended change from Zosyn to Ceftazidime. Pharmacy has been consulted for Ceftazidime dosing.  Plan: Ceftazidime 1g IV x 1 dose this evening followed by ceftazidime 1g IV with HD on HD days.   Will need to monitor HD schedule and make sure dose is given in HD.   Height: 5' 4.5" (163.8 cm) Weight: 126.1 kg (278 lb) IBW/kg (Calculated) : 55.85  Temp (24hrs), Avg:98.5 F (36.9 C), Min:98.1 F (36.7 C), Max:99 F (37.2 C)  Recent Labs  Lab 10/23/20 0456 10/24/20 0527 10/25/20 0410 10/26/20 0439 10/26/20 1619 10/29/20 0523  WBC 30.7* 28.2* 25.9* 25.4*  --  19.8*  CREATININE 7.09* 8.43* 6.74* 8.01* 6.20* 7.04*    Estimated Creatinine Clearance: 12.8 mL/min (A) (by C-G formula based on SCr of 7.04 mg/dL (H)).    Allergies  Allergen Reactions   Dust Mite Extract Shortness Of Breath and Itching   Mold Extract [Trichophyton] Shortness Of Breath and Itching   Pollen Extract Shortness Of Breath    Itching, shortness of breath, asthma like symptoms   Sulfa Antibiotics Itching   Feraheme [Ferumoxytol] Itching    Uses benadryl so she can get the medicine    Antimicrobials this admission: 8/21 Ceftriaxone x 1 8/21 vancomycin>> 8/25 8/22 Cefepime>>8/25 8/26 Unasyn>> 8/29 8/26 Zosyn>> 9/4 9/4 Ceftazidime>>   Microbiology results: 9/1 aerobic/anaerobic foot Cx:  RARE STREPTOCOCCUS ANGINOSIS  8/23 Wound Cx: ENTEROCOCCUS FAECALIS, KLEBSIELLA OXYTOCA  Thank you for allowing pharmacy to be a part of this patient's care.  Pernell Dupre, PharmD, BCPS Clinical Pharmacist 10/29/2020 4:29 PM

## 2020-10-29 NOTE — Progress Notes (Signed)
No IV access, Wiet[place] MD aware

## 2020-10-29 NOTE — Progress Notes (Signed)
For all providers to be aware pt. has poor IV access please see progress note from IV team. Pt. was stuck 3-5X from floor nurses before IV team consulted.[name] an ED nurse did come to the floor following IV team and placed an additional IV but when this nurse went to the room and assessed the catheter was almost completely out already without use.

## 2020-10-30 DIAGNOSIS — E11628 Type 2 diabetes mellitus with other skin complications: Secondary | ICD-10-CM | POA: Diagnosis not present

## 2020-10-30 DIAGNOSIS — E11621 Type 2 diabetes mellitus with foot ulcer: Secondary | ICD-10-CM | POA: Diagnosis not present

## 2020-10-30 DIAGNOSIS — E1122 Type 2 diabetes mellitus with diabetic chronic kidney disease: Secondary | ICD-10-CM | POA: Diagnosis not present

## 2020-10-30 DIAGNOSIS — L089 Local infection of the skin and subcutaneous tissue, unspecified: Secondary | ICD-10-CM | POA: Diagnosis not present

## 2020-10-30 DIAGNOSIS — I739 Peripheral vascular disease, unspecified: Secondary | ICD-10-CM | POA: Diagnosis not present

## 2020-10-30 DIAGNOSIS — I96 Gangrene, not elsewhere classified: Secondary | ICD-10-CM | POA: Diagnosis not present

## 2020-10-30 LAB — GLUCOSE, CAPILLARY
Glucose-Capillary: 183 mg/dL — ABNORMAL HIGH (ref 70–99)
Glucose-Capillary: 180 mg/dL — ABNORMAL HIGH (ref 70–99)
Glucose-Capillary: 254 mg/dL — ABNORMAL HIGH (ref 70–99)
Glucose-Capillary: 256 mg/dL — ABNORMAL HIGH (ref 70–99)

## 2020-10-30 MED ORDER — SODIUM CHLORIDE 0.9 % IV SOLN
1.0000 g | Freq: Once | INTRAVENOUS | Status: AC
  Administered 2020-10-30: 1 g via INTRAVENOUS
  Filled 2020-10-30: qty 1

## 2020-10-30 MED ORDER — METRONIDAZOLE 500 MG PO TABS
500.0000 mg | ORAL_TABLET | Freq: Two times a day (BID) | ORAL | Status: DC
  Administered 2020-10-30 – 2020-11-10 (×22): 500 mg via ORAL
  Filled 2020-10-30 (×22): qty 1

## 2020-10-30 MED ORDER — CIPROFLOXACIN-DEXAMETHASONE 0.3-0.1 % OT SUSP
4.0000 [drp] | Freq: Two times a day (BID) | OTIC | Status: DC
  Administered 2020-10-30 – 2020-11-10 (×18): 4 [drp] via OTIC
  Filled 2020-10-30 (×2): qty 7.5

## 2020-10-30 MED ORDER — INSULIN GLARGINE-YFGN 100 UNIT/ML ~~LOC~~ SOLN
40.0000 [IU] | Freq: Every day | SUBCUTANEOUS | Status: DC
  Administered 2020-10-30 – 2020-11-02 (×4): 40 [IU] via SUBCUTANEOUS
  Filled 2020-10-30 (×5): qty 0.4

## 2020-10-30 NOTE — Progress Notes (Signed)
Patient completed dialysis as ordered. 2 liters fluid removal over 2 hours. Patient tolerated well. Patient inquired about antibiotic that is usually given during dialysis. Per MAR next dose is scheduled with her dialysis on Tues, Thurs and Sat. Patient floor nurse called toward the end of patient dialysis treatment to informed nurse that there is a one time dose for today at the end of dialysis. Patient had less than 15 minutes left of treatment and antibiotic is set to run for 30 minutes. Patient floor nurse Christiana Fuchs, RN made aware.

## 2020-10-30 NOTE — Progress Notes (Signed)
Patient ID: [name], female   DOB: 1970-03-04, 50 y.o.   MRN: [id number] [facility] PROGRESS NOTE  [name] [id number] DOB: 1970/07/27 DOA: 10/15/2020 PCP: [name] Berry, NP  HPI/Subjective: Patient still has some pain in her right foot.  Difficult IV access and antibiotics changed to dialysis dosing.  Patient ends for an extra hemodialysis session today to remove some fluid.  Admitted with diabetic foot ulcers and gangrene.  Objective: Vitals:   10/30/20 1215 10/30/20 1230  BP: (!) 134/41   Pulse:    Resp: 14 17  Temp:    SpO2:      Intake/Output Summary (Last 24 hours) at 10/30/2020 1300 Last data filed at 10/30/2020 0300 Gross per 24 hour  Intake 30 ml  Output --  Net 30 ml   Filed Weights   10/15/20 1905 10/18/20 0928  Weight: 126.1 kg 126.1 kg    ROS: Review of Systems  Respiratory:  Negative for shortness of breath.   Cardiovascular:  Negative for chest pain.  Gastrointestinal:  Negative for abdominal pain, nausea and vomiting.  Musculoskeletal:  Positive for joint pain.  Exam: Physical Exam HENT:     Head: Normocephalic.     Mouth/Throat:     Pharynx: No oropharyngeal exudate.  Eyes:     General: Lids are normal.     Conjunctiva/sclera: Conjunctivae normal.  Cardiovascular:     Rate and Rhythm: Normal rate and regular rhythm.     Heart sounds: Normal heart sounds, S1 normal and S2 normal.  Pulmonary:     Breath sounds: Normal breath sounds. No decreased breath sounds, wheezing, rhonchi or rales.  Abdominal:     Palpations: Abdomen is soft.     Tenderness: There is no abdominal tenderness.  Musculoskeletal:     Right lower leg: Swelling present.     Left lower leg: Swelling present.  Skin:    General: Skin is warm.     Comments: Right foot covered.  Darker discoloration over her toes right foot.  Small blackish dot discoloration bilateral lower extremities  Neurological:     Mental Status: She is alert and oriented to person,  place, and time.      Scheduled Meds:  vitamin C  500 mg Oral Daily   aspirin EC  81 mg Oral Daily   Chlorhexidine Gluconate Cloth  6 each Topical Daily   cholecalciferol  4,000 Units Oral Weekly   cinacalcet  60 mg Oral Daily   ciprofloxacin-dexamethasone  4 drop Both EARS BID   docusate sodium  100 mg Oral Daily   feeding supplement (NEPRO CARB STEADY)  237 mL Oral BID BM   ferric citrate  210 mg Oral TID WC   heparin  5,000 Units Subcutaneous Q8H   insulin aspart  0-5 Units Subcutaneous QHS   insulin aspart  0-9 Units Subcutaneous TID WC   insulin aspart  2 Units Subcutaneous TID WC   insulin glargine-yfgn  40 Units Subcutaneous QHS   ipratropium-albuterol  3 mL Nebulization TID   linaclotide  290 mcg Oral q morning   loratadine  10 mg Oral Daily   mometasone-formoterol  2 puff Inhalation BID   multivitamin  1 tablet Oral QHS   pantoprazole  40 mg Oral Daily   sodium chloride flush  3 mL Intravenous Q12H   Continuous Infusions:  sodium chloride 0 mL/hr at 10/22/20 1200   [START ON 10/31/2020] cefTAZidime (FORTAZ)  IV     cefTAZidime (FORTAZ)  IV  Assessment/Plan:  Diabetic foot ulcers with gangrene of the fifth toe and osteomyelitis.  On 10/21/2020 patient had an amputation of fifth ray and I&D of abscess of the right foot.  On 10/26/2020 had washout procedure.  Continue close monitoring.  With poor IV access switched antibiotics over to ceftazidime with dialysis.  Podiatry to follow-up with the patient about how the wound is looking and the next steps.  Previous recommendations were amputation. Type 2 diabetes mellitus with end-stage renal disease.  Extra hemodialysis session today.  Normal days Tuesday Thursday and Saturday.  With patient eating better I will increase glargine insulin to 40 units at night and continue sliding scale and short acting insulin prior to meals. Peripheral vascular disease.  Angiogram on 10/18/2020 showed popliteal artery mid lesion 30%.  On  aspirin. Anemia of chronic disease.  Hemoglobin fluctuates during the hospital course.  Last hemoglobin 8.1. Hyponatremia.  Managed with hemodialysis Chronic hypoxic respiratory failure on baseline oxygen 3 to 4 L. Morbid obesity Lower extremity.Blackish lesions looks like calciphylaxis to me Weakness.  Physical therapy evaluation.  So far nonweightbearing right foot. Hearing loss continue Claritin.  May need ENT as outpatient.  Patient wants to go back on eardrops.  I told her that the Claritin should be the thing that would help her the most.  I do not think the eardrops will help        Code Status:     Code Status Orders  (From admission, onward)           Start     Ordered   10/15/20 2114  Full code  Continuous        10/15/20 2119           Code Status History     Date Active Date Inactive Code Status Order ID Comments User Context   03/24/2017 1953 04/28/2017 1906 Full Code [id number]  Hillary Bow, MD ED   03/03/2017 1420 03/10/2017 2131 Full Code [id number]  Fritzi Mandes, MD ED   01/07/2017 1725 01/10/2017 1900 Full Code [id number]  Demetrios Loll, MD Inpatient   11/08/2016 0450 11/16/2016 1717 Full Code [id number]  Hugelmeyer, Alexis, DO Inpatient   06/25/2016 1447 06/29/2016 1815 Full Code [id number]  Hillary Bow, MD ED   08/09/2015 1757 08/13/2015 1721 Full Code [id number]  Bettey Costa, MD Inpatient   12/31/2014 2025 01/03/2015 1749 Full Code [id number]  Hower, Aaron Mose, MD ED   12/15/2014 1154 12/19/2014 1657 Full Code [id number]  Hillary Bow, MD ED      Family Communication: Spoke with husband at the bedside Disposition Plan: Status is: Inpatient  Dispo: The patient is from: Home              Anticipated d/c is to: To be determined              Patient currently being treated for diabetic foot infection with gangrene.  Continue close monitoring of the wounds with podiatry.  Likely patient will need an amputation.   Difficult to place patient.   No.  Consultants: Infectious disease Nephrology Podiatry  Antibiotics: Ceftazidime with dialysis  Time spent: 26 minutes  [place]

## 2020-10-30 NOTE — Progress Notes (Signed)
[facility]  ROUNDING NOTE   Subjective:   [name] was admitted to [facility] on 10/15/2020 for Gangrene [facility]) [I96] Diabetic foot ulcer ([place]) [E11.621, L97.509] Decreased pedal pulses [R09.89] Sepsis, due to unspecified organism, unspecified whether acute organ dysfunction present [facility]) [A41.9]  Patient seen and evaluated during hemodialysis treatment this AM. Tolerating treatment well. Ultrafiltration target 2 kg today.  Objective:  Vital signs in last 24 hours:  Temp:  [97.7 F (36.5 C)-98.8 F (37.1 C)] 98.8 F (37.1 C) (09/05 1055) Pulse Rate:  [52-115] 62 (09/05 1243) Resp:  [9-25] 18 (09/05 1243) BP: (71-134)/(41-85) 115/83 (09/05 1243) SpO2:  [92 %-100 %] 100 % (09/05 1151)  Weight change:  Filed Weights   10/15/20 1905 10/18/20 0928  Weight: 126.1 kg 126.1 kg    Intake/Output: I/O last 3 completed shifts: In: 133 [P.O.:30; I.V.:3; IV Piggyback:100] Out: 0    Intake/Output this shift:  Total I/O In: -  Out: 2000 [Other:2000]  Physical Exam: General: NAD, laying in bed  Head: Normocephalic, atraumatic. Moist oral mucosal membranes  Eyes: Anicteric  Lungs:  Clear to auscultation, O2 2L  Heart: Regular rate and rhythm  Abdomen:  Soft, nontender  Extremities: trace peripheral edema. Right foot fifth toe amputation on 10/21/20, ace wrapped  Neurologic: Nonfocal, moving all four extremities  Skin: No lesions  Access: Left AVF    Basic Metabolic Panel: Recent Labs  Lab 10/24/20 0527 10/25/20 0410 10/26/20 0439 10/26/20 1619 10/29/20 0523  NA 123* 126* 124* 130* 129*  K 5.6* 3.9 4.4 4.0 3.8  CL 85* 87* 86* 95* 91*  CO2 18* 23 21*  --  23  GLUCOSE 198* 199* 257* 143* 277*  BUN 68* 51* 67* 36* 43*  CREATININE 8.43* 6.74* 8.01* 6.20* 7.04*  CALCIUM 7.0* 7.0* 6.9*  --  6.9*  MG  --   --  2.0  --   --   PHOS  --   --   --   --  5.6*     Liver Function Tests: Recent Labs  Lab 10/24/20 0527 10/25/20 0410  AST 346* 561*  ALT  91* 167*  ALKPHOS 661* 661*  BILITOT 1.2 1.2  PROT 7.0 6.8  ALBUMIN 2.5* 2.3*    No results for input(s): LIPASE, AMYLASE in the last 168 hours. No results for input(s): AMMONIA in the last 168 hours.  CBC: Recent Labs  Lab 10/24/20 0527 10/25/20 0410 10/26/20 0439 10/26/20 1619 10/29/20 0523  WBC 28.2* 25.9* 25.4*  --  19.8*  HGB 8.1* 7.9* 8.4* 9.9* 8.1*  HCT 24.2* 24.1* 26.0* 29.0* 25.3*  MCV 94.9 94.5 92.5  --  95.5  PLT 398 389 390  --  341     Cardiac Enzymes: No results for input(s): CKTOTAL, CKMB, CKMBINDEX, TROPONINI in the last 168 hours.  BNP: Invalid input(s): POCBNP  CBG: Recent Labs  Lab 10/29/20 0727 10/29/20 1142 10/29/20 1712 10/29/20 2152 10/30/20 0754  GLUCAP 241* 186* 233* 275* 183*     Microbiology: Results for orders placed or performed during the hospital encounter of 10/15/20  Blood Culture (routine x 2)     Status: None   Collection Time: 10/15/20  8:03 PM   Specimen: BLOOD  Result Value Ref Range Status   Specimen Description BLOOD BLOOD RIGHT FOREARM  Final   Special Requests   Final    BOTTLES DRAWN AEROBIC AND ANAEROBIC Blood Culture adequate volume   Culture   Final    NO GROWTH  5 DAYS Performed at [facility], [address] [place], [place] [address]    Report Status 10/20/2020 FINAL  Final  Resp Panel by RT-PCR (Flu A&B, Covid) Nasopharyngeal Swab     Status: None   Collection Time: 10/15/20  8:26 PM   Specimen: Nasopharyngeal Swab; Nasopharyngeal(NP) swabs in vial transport medium  Result Value Ref Range Status   SARS Coronavirus 2 by RT PCR NEGATIVE NEGATIVE Final    Comment: (NOTE) SARS-CoV-2 target nucleic acids are NOT DETECTED.  The SARS-CoV-2 RNA is generally detectable in upper respiratory specimens during the acute phase of infection. The lowest concentration of SARS-CoV-2 viral copies this assay can detect is 138 copies/mL. A negative result does not preclude SARS-Cov-2 infection and should not  be used as the sole basis for treatment or other patient management decisions. A negative result may occur with  improper specimen collection/handling, submission of specimen other than nasopharyngeal swab, presence of viral mutation(s) within the areas targeted by this assay, and inadequate number of viral copies(<138 copies/mL). A negative result must be combined with clinical observations, patient history, and epidemiological information. The expected result is Negative.  Fact Sheet for Patients:  EntrepreneurPulse.com.au  Fact Sheet for Healthcare Providers:  IncredibleEmployment.be  This test is no t yet approved or cleared by the [place] FDA and  has been authorized for detection and/or diagnosis of SARS-CoV-2 by FDA under an Emergency Use Authorization (EUA). This EUA will remain  in effect (meaning this test can be used) for the duration of the COVID-19 declaration under Section 564(b)(1) of the Act, 21 U.S.C.section 360bbb-3(b)(1), unless the authorization is terminated  or revoked sooner.       Influenza A by PCR NEGATIVE NEGATIVE Final   Influenza B by PCR NEGATIVE NEGATIVE Final    Comment: (NOTE) The Xpert Xpress SARS-CoV-2/FLU/RSV plus assay is intended as an aid in the diagnosis of influenza from Nasopharyngeal swab specimens and should not be used as a sole basis for treatment. Nasal washings and aspirates are unacceptable for Xpert Xpress SARS-CoV-2/FLU/RSV testing.  Fact Sheet for Patients: EntrepreneurPulse.com.au  Fact Sheet for Healthcare Providers: IncredibleEmployment.be  This test is not yet approved or cleared by the [place] FDA and has been authorized for detection and/or diagnosis of SARS-CoV-2 by FDA under an Emergency Use Authorization (EUA). This EUA will remain in effect (meaning this test can be used) for the duration of the COVID-19 declaration under Section  564(b)(1) of the Act, 21 U.S.C. section 360bbb-3(b)(1), unless the authorization is terminated or revoked.  Performed at [facility], [address] [place], [place] [address]   Blood Culture (routine x 2)     Status: None   Collection Time: 10/15/20  8:26 PM   Specimen: BLOOD  Result Value Ref Range Status   Specimen Description BLOOD RIGHT ANTECUBITAL  Final   Special Requests   Final    BOTTLES DRAWN AEROBIC AND ANAEROBIC Blood Culture adequate volume   Culture   Final    NO GROWTH 5 DAYS Performed at [facility], [address], [place], [place] [address]    Report Status 10/20/2020 FINAL  Final  Aerobic Culture w Gram Stain (superficial specimen)     Status: None   Collection Time: 10/17/20  8:52 PM   Specimen: Wound  Result Value Ref Range Status   Specimen Description WOUND  Final   Special Requests NONE  Final   Gram Stain   Final    RARE SQUAMOUS EPITHELIAL  CELLS PRESENT FEW WBC PRESENT, PREDOMINANTLY MONONUCLEAR ABUNDANT GRAM VARIABLE ROD FEW GRAM POSITIVE COCCI    Culture   Final    MODERATE ENTEROCOCCUS FAECALIS MODERATE KLEBSIELLA OXYTOCA ABUNDANT DIPHTHEROIDS(CORYNEBACTERIUM SPECIES) Standardized susceptibility testing for this organism is not available.    Report Status 10/20/2020 FINAL  Final   Organism ID, Bacteria ENTEROCOCCUS FAECALIS  Final   Organism ID, Bacteria KLEBSIELLA OXYTOCA  Final      Susceptibility   Enterococcus faecalis - MIC*    AMPICILLIN <=2 SENSITIVE Sensitive     VANCOMYCIN 1 SENSITIVE Sensitive     GENTAMICIN SYNERGY RESISTANT Resistant     * MODERATE ENTEROCOCCUS FAECALIS   Klebsiella oxytoca - MIC*    AMPICILLIN >=32 RESISTANT Resistant     CEFAZOLIN <=4 SENSITIVE Sensitive     CEFEPIME <=0.12 SENSITIVE Sensitive     CEFTAZIDIME <=1 SENSITIVE Sensitive     CEFTRIAXONE <=0.25 SENSITIVE Sensitive     CIPROFLOXACIN <=0.25 SENSITIVE Sensitive     GENTAMICIN <=1 SENSITIVE Sensitive     IMIPENEM <=0.25  SENSITIVE Sensitive     TRIMETH/SULFA <=20 SENSITIVE Sensitive     AMPICILLIN/SULBACTAM 8 SENSITIVE Sensitive     PIP/TAZO <=4 SENSITIVE Sensitive     * MODERATE KLEBSIELLA OXYTOCA  Aerobic/Anaerobic Culture w Gram Stain (surgical/deep wound)     Status: None   Collection Time: 10/21/20  3:07 PM   Specimen: PATH Digit amputation; Tissue  Result Value Ref Range Status   Specimen Description   Final    FOOT  RIGHT 5TH DIGIT AMPUTATION Performed at [facility], [address] [place], [place] [address]    Special Requests   Final    NONE Performed at [facility], [place], [place] [address]    Gram Stain   Final    FEW SQUAMOUS EPITHELIAL CELLS PRESENT FEW WBC SEEN FEW GRAM POSITIVE COCCI    Culture   Final    FEW STREPTOCOCCUS ANGINOSIS NO ANAEROBES ISOLATED Performed at [facility], [place] [address], [place], [place] [address]    Report Status 10/27/2020 FINAL  Final   Organism ID, Bacteria STREPTOCOCCUS ANGINOSIS  Final      Susceptibility   Streptococcus anginosis - MIC*    PENICILLIN 0.12 SENSITIVE Sensitive     CEFTRIAXONE 0.5 SENSITIVE Sensitive     ERYTHROMYCIN 4 RESISTANT Resistant     LEVOFLOXACIN 0.5 SENSITIVE Sensitive     VANCOMYCIN 0.5 SENSITIVE Sensitive     * FEW STREPTOCOCCUS ANGINOSIS  Aerobic/Anaerobic Culture w Gram Stain (surgical/deep wound)     Status: None (Preliminary result)   Collection Time: 10/26/20  5:35 PM   Specimen: PATH Other; Tissue  Result Value Ref Range Status   Specimen Description   Final    FOOT RIGHT Performed at [facility], [address], [place], [place] [address]    Special Requests   Final    NONE Performed at [facility], [address] [place], [place] [address]    Gram Stain   Final    NO WBC SEEN NO ORGANISMS SEEN Performed at [facility], [place] [address], [place], [place] [address]    Culture   Final    RARE STREPTOCOCCUS ANGINOSIS NO  ANAEROBES ISOLATED; CULTURE IN PROGRESS FOR 5 DAYS    Report Status PENDING  Incomplete   Organism ID, Bacteria STREPTOCOCCUS ANGINOSIS  Final      Susceptibility   Streptococcus anginosis - MIC*  PENICILLIN 0.12 SENSITIVE Sensitive     CEFTRIAXONE 0.5 SENSITIVE Sensitive     ERYTHROMYCIN >=8 RESISTANT Resistant     LEVOFLOXACIN 0.5 SENSITIVE Sensitive     VANCOMYCIN 0.5 SENSITIVE Sensitive     * RARE STREPTOCOCCUS ANGINOSIS    Coagulation Studies: No results for input(s): LABPROT, INR in the last 72 hours.   Urinalysis: No results for input(s): COLORURINE, LABSPEC, PHURINE, GLUCOSEU, HGBUR, BILIRUBINUR, KETONESUR, PROTEINUR, UROBILINOGEN, NITRITE, LEUKOCYTESUR in the last 72 hours.  Invalid input(s): APPERANCEUR    Imaging: No results found.   Medications:    sodium chloride 0 mL/hr at 10/22/20 1200   [START ON 10/31/2020] cefTAZidime (FORTAZ)  IV     cefTAZidime (FORTAZ)  IV      vitamin C  500 mg Oral Daily   aspirin EC  81 mg Oral Daily   Chlorhexidine Gluconate Cloth  6 each Topical Daily   cholecalciferol  4,000 Units Oral Weekly   cinacalcet  60 mg Oral Daily   ciprofloxacin-dexamethasone  4 drop Both EARS BID   docusate sodium  100 mg Oral Daily   feeding supplement (NEPRO CARB STEADY)  237 mL Oral BID BM   ferric citrate  210 mg Oral TID WC   heparin  5,000 Units Subcutaneous Q8H   insulin aspart  0-5 Units Subcutaneous QHS   insulin aspart  0-9 Units Subcutaneous TID WC   insulin aspart  2 Units Subcutaneous TID WC   insulin glargine-yfgn  40 Units Subcutaneous QHS   ipratropium-albuterol  3 mL Nebulization TID   linaclotide  290 mcg Oral q morning   loratadine  10 mg Oral Daily   mometasone-formoterol  2 puff Inhalation BID   multivitamin  1 tablet Oral QHS   pantoprazole  40 mg Oral Daily   sodium chloride flush  3 mL Intravenous Q12H   sodium chloride, acetaminophen, albuterol, albuterol, ALPRAZolam, alum & mag hydroxide-simeth, calcium carbonate,  dextromethorphan-guaiFENesin, diphenhydrAMINE, hydrALAZINE, lactulose, metoprolol tartrate, ondansetron (ZOFRAN) IV, ondansetron, oxyCODONE, oxyCODONE, phenol, polyethylene glycol, sodium chloride flush, traZODone  Assessment/ Plan:  [name] is a 50 y.o. black female with end stage renal disease on hemodialysis, diabetes mellitus type II, hypertension, congestive heart failure, COPD, who is admitted to [facility] on 10/15/2020 for Gangrene [facility]) [I96] Diabetic foot ulcer ([place]) [D32.671, L97.509] Decreased pedal pulses [R09.89] Sepsis, due to unspecified organism, unspecified whether acute organ dysfunction present ([place]) [A41.9]  [facility] TTS Left AVF 127.5kg  End Stage Renal Disease: Patient seen and evaluated during extra hemodialysis treatment today.  Tolerating well.  We will plan to complete dialysis today.  Hypotension: Blood pressure appears improved at the moment at 115/83.  Anemia with chronic kidney disease: Start the patient on Epogen 10,000 IV with dialysis next treatment.  Secondary Hyperparathyroidism with hyeprphosphatemia: there is risk for calciphylaxis. Outpatient labs on 8/9: phos 10.6, calcium 8.2, PTH 1456.  -Continue Cinacalcet and Renvela.  Diabetes mellitus type II with chronic kidney disease: insulin dependent. Complicated with diabetic foot ulcer. History of poor control. Hemoglobin A1c 9.2% on 09/19/2020.  - Appreciate podiatry and vascular input.  -Currently receiving Zosyn - Vascular performed right lower angiogram. 30% mild lesion found popliteal artery. Deemed adequate for wound healing.  - Podiatry performed right foot, partial fifth toe amputation on 10/21/20. Dressing changes determine need for further amputation. Patient requesting second opinion.    6.    Hyponatremia: Sodium should be close to normal by end of dialysis treatment tomorrow.  LOS: 15 Izrael Peak 9/5/20221:41 PM

## 2020-10-30 NOTE — Progress Notes (Signed)
ID Pt is doing okay She wants to try antibiotics and see how her foot does She does not want to get BKA If she decides to have BKA she will get it done at [facility] No distress Not septic looking Patient Vitals for the past 24 hrs:  BP Temp Temp src Pulse Resp SpO2  10/30/20 1315 124/65 98.6 F (37 C) Oral -- 16 --  10/30/20 1243 115/83 -- -- 62 18 --  10/30/20 1230 -- -- -- -- 17 --  10/30/20 1215 (!) 134/41 -- -- -- 14 --  10/30/20 1200 -- -- -- -- 15 --  10/30/20 1159 -- -- -- -- 14 --  10/30/20 1158 -- -- -- -- 14 --  10/30/20 1157 -- -- -- -- 13 --  10/30/20 1156 -- -- -- -- (!) 21 --  10/30/20 1155 101/63 -- -- -- 19 --  10/30/20 1154 -- -- -- -- 14 --  10/30/20 1153 -- -- -- -- 13 --  10/30/20 1152 -- -- -- -- 17 --  10/30/20 1151 -- -- -- (!) 52 16 100 %  10/30/20 1150 -- -- -- -- 13 --  10/30/20 1149 -- -- -- -- 15 --  10/30/20 1148 -- -- -- -- 16 --  10/30/20 1147 -- -- -- -- (!) 24 --  10/30/20 1146 -- -- -- -- (!) 25 --  10/30/20 1145 (!) 71/54 -- -- 87 16 98 %  10/30/20 1130 (!) 117/56 -- -- -- 15 --  10/30/20 1120 -- -- -- -- 18 --  10/30/20 1115 119/66 -- -- -- 19 --  10/30/20 1110 -- -- -- (!) 115 18 100 %  10/30/20 1105 101/63 -- -- 80 14 100 %  10/30/20 1100 -- -- -- 89 15 100 %  10/30/20 1055 125/63 98.8 F (37.1 C) Oral -- 15 --  10/30/20 1050 -- -- -- -- 15 --  10/30/20 1045 -- -- -- -- 17 --  10/30/20 1040 -- -- -- -- 12 --  10/30/20 1035 -- -- -- 88 (!) 9 100 %  10/30/20 0818 -- -- -- -- -- 100 %  10/30/20 0757 127/67 97.7 F (36.5 C) -- 93 16 --  10/30/20 0604 115/72 98.1 F (36.7 C) Oral 96 16 100 %  10/29/20 2151 (!) 95/50 98 F (36.7 C) Oral 96 17 100 %  10/29/20 2022 -- -- -- -- -- 94 %    Chest b/l air entry [id number] Rt foot dressing reoved Devitaized wound on the mid foot 5th toe amputation site has wound with white slough       Labs CBC Latest Ref Rng & Units 10/29/2020 10/26/2020 10/26/2020  WBC 4.0 - 10.5 K/uL 19.8(H) - 25.4(H)   Hemoglobin 12.0 - 15.0 g/dL 8.1(L) 9.9(L) 8.4(L)  Hematocrit 36.0 - 46.0 % 25.3(L) 29.0(L) 26.0(L)  Platelets 150 - 400 K/uL 341 - 390    CMP Latest Ref Rng & Units 10/29/2020 10/26/2020 10/26/2020  Glucose 70 - 99 mg/dL 277(H) 143(H) 257(H)  BUN 6 - 20 mg/dL 43(H) 36(H) 67(H)  Creatinine 0.44 - 1.00 mg/dL 7.04(H) 6.20(H) 8.01(H)  Sodium 135 - 145 mmol/L 129(L) 130(L) 124(L)  Potassium 3.5 - 5.1 mmol/L 3.8 4.0 4.4  Chloride 98 - 111 mmol/L 91(L) 95(L) 86(L)  CO2 22 - 32 mmol/L 23 - 21(L)  Calcium 8.9 - 10.3 mg/dL 6.9(L) - 6.9(L)  Total Protein 6.5 - 8.1 g/dL - - -  Total Bilirubin 0.3 - 1.2 mg/dL - - -  Alkaline Phos 38 - 126 U/L - - -  AST 15 - 41 U/L - - -  ALT 0 - 44 U/L - - -     Impression/recommendation Diabetic foot infection with necrotic infection of the rt foot with osteomyelitis S/p 5 th toe amputation- deep infection of the foot space- had further debridement and washout . Pt needs BKA but is not ready Has been on zosyn which was changed to ceftazidine + flagyl  The former will be given during dialysis Leucocytosis persist Will continue antibiotics for 4 more weeks  ESRD on dialysis  Discussed the management with te patient and her husband

## 2020-10-30 NOTE — Progress Notes (Signed)
PT Cancellation Note  Patient Details Name: [name] MRN: [id number] DOB: [birth year]   Cancelled Treatment:    Reason Eval/Treat Not Completed: Patient at procedure or test/unavailable. Chart reviewed, treatment session attempted. Patient reports she is preparing to go off floor for HD treatment at this time.  Patient expresses continued disappointment in her limited mobility capabilities since physical therapy treatment yesterday.  Will attempt again at later date/time.  12:07 PM, 10/30/20 Etta Grandchild, PT, DPT Physical Therapist - [facility]  [phone] ([facility])     [place] [facility] 10/30/2020, 12:06 PM

## 2020-10-30 NOTE — Progress Notes (Signed)
Pharmacy Antibiotic Note  [name] is a 50 y.o. female admitted on 10/15/2020 with Diabetic foot infection with gangrene and osteomyelitis of 5th toe. S/P amputation on 8/27 and wash out and debridement 10/26/20. Patient has PMH of ESRD on HD. Per provider, patient keeps losing IV access and needs medication that can be given during dialysis. ID has recommended change from Zosyn to Ceftazidime. Pharmacy has been consulted for Ceftazidime dosing.  Plan: Ceftazidime 1g IV x 1 dose this evening followed by ceftazidime 1g IV with HD on HD days.   Will need to monitor HD schedule and make sure dose is given in HD.   9/5- extra Hemodialysis session today- ordered Ceftazidime 1 gm x 1 dose     Height: 5' 4.5" (163.8 cm) Weight: 126.1 kg (278 lb) IBW/kg (Calculated) : 55.85  Temp (24hrs), Avg:98.3 F (36.8 C), Min:97.7 F (36.5 C), Max:98.8 F (37.1 C)  Recent Labs  Lab 10/24/20 0527 10/25/20 0410 10/26/20 0439 10/26/20 1619 10/29/20 0523  WBC 28.2* 25.9* 25.4*  --  19.8*  CREATININE 8.43* 6.74* 8.01* 6.20* 7.04*     Estimated Creatinine Clearance: 12.8 mL/min (A) (by C-G formula based on SCr of 7.04 mg/dL (H)).    Allergies  Allergen Reactions   Dust Mite Extract Shortness Of Breath and Itching   Mold Extract [Trichophyton] Shortness Of Breath and Itching   Pollen Extract Shortness Of Breath    Itching, shortness of breath, asthma like symptoms   Sulfa Antibiotics Itching   Feraheme [Ferumoxytol] Itching    Uses benadryl so she can get the medicine    Antimicrobials this admission: 8/21 Ceftriaxone x 1 8/21 vancomycin>> 8/25 8/22 Cefepime>>8/25 8/26 Unasyn>> 8/29 8/26 Zosyn>> 9/4 9/4 Ceftazidime>>   Microbiology results: 9/1 aerobic/anaerobic foot Cx:  RARE STREPTOCOCCUS ANGINOSIS  8/23 Wound Cx: ENTEROCOCCUS FAECALIS, KLEBSIELLA OXYTOCA  Thank you for allowing pharmacy to be a part of this patient's care.  Noralee Space, PharmD Clinical  Pharmacist 10/30/2020 12:27 PM

## 2020-10-31 DIAGNOSIS — E1122 Type 2 diabetes mellitus with diabetic chronic kidney disease: Secondary | ICD-10-CM | POA: Diagnosis not present

## 2020-10-31 DIAGNOSIS — E11621 Type 2 diabetes mellitus with foot ulcer: Secondary | ICD-10-CM | POA: Diagnosis not present

## 2020-10-31 DIAGNOSIS — D638 Anemia in other chronic diseases classified elsewhere: Secondary | ICD-10-CM | POA: Diagnosis not present

## 2020-10-31 DIAGNOSIS — I96 Gangrene, not elsewhere classified: Secondary | ICD-10-CM | POA: Diagnosis not present

## 2020-10-31 LAB — AEROBIC/ANAEROBIC CULTURE W GRAM STAIN (SURGICAL/DEEP WOUND): Gram Stain: NONE SEEN

## 2020-10-31 LAB — CBC WITH DIFFERENTIAL/PLATELET
Abs Immature Granulocytes: 0.41 10*3/uL — ABNORMAL HIGH (ref 0.00–0.07)
Basophils Absolute: 0.1 10*3/uL (ref 0.0–0.1)
Basophils Relative: 1 %
Eosinophils Absolute: 0.1 10*3/uL (ref 0.0–0.5)
Eosinophils Relative: 1 %
HCT: 23.4 % — ABNORMAL LOW (ref 36.0–46.0)
Hemoglobin: 7.5 g/dL — ABNORMAL LOW (ref 12.0–15.0)
Immature Granulocytes: 3 %
Lymphocytes Relative: 7 %
Lymphs Abs: 1.1 10*3/uL (ref 0.7–4.0)
MCH: 30.6 pg (ref 26.0–34.0)
MCHC: 32.1 g/dL (ref 30.0–36.0)
MCV: 95.5 fL (ref 80.0–100.0)
Monocytes Absolute: 1 10*3/uL (ref 0.1–1.0)
Monocytes Relative: 6 %
Neutro Abs: 12.6 10*3/uL — ABNORMAL HIGH (ref 1.7–7.7)
Neutrophils Relative %: 82 %
Platelets: 296 10*3/uL (ref 150–400)
RBC: 2.45 MIL/uL — ABNORMAL LOW (ref 3.87–5.11)
RDW: 14.6 % (ref 11.5–15.5)
WBC: 15.3 10*3/uL — ABNORMAL HIGH (ref 4.0–10.5)
nRBC: 0 % (ref 0.0–0.2)

## 2020-10-31 LAB — BASIC METABOLIC PANEL
Anion gap: 14 (ref 5–15)
BUN: 48 mg/dL — ABNORMAL HIGH (ref 6–20)
CO2: 23 mmol/L (ref 22–32)
Calcium: 6.4 mg/dL — CL (ref 8.9–10.3)
Chloride: 90 mmol/L — ABNORMAL LOW (ref 98–111)
Creatinine, Ser: 7.53 mg/dL — ABNORMAL HIGH (ref 0.44–1.00)
GFR, Estimated: 6 mL/min — ABNORMAL LOW (ref >60)
Glucose, Bld: 266 mg/dL — ABNORMAL HIGH (ref 70–99)
Potassium: 3.8 mmol/L (ref 3.5–5.1)
Sodium: 127 mmol/L — ABNORMAL LOW (ref 135–145)

## 2020-10-31 LAB — GLUCOSE, CAPILLARY
Glucose-Capillary: 219 mg/dL — ABNORMAL HIGH (ref 70–99)
Glucose-Capillary: 191 mg/dL — ABNORMAL HIGH (ref 70–99)
Glucose-Capillary: 176 mg/dL — ABNORMAL HIGH (ref 70–99)
Glucose-Capillary: 174 mg/dL — ABNORMAL HIGH (ref 70–99)
Glucose-Capillary: 243 mg/dL — ABNORMAL HIGH (ref 70–99)
Glucose-Capillary: 259 mg/dL — ABNORMAL HIGH (ref 70–99)

## 2020-10-31 MED ORDER — VANCOMYCIN HCL IN DEXTROSE 1-5 GM/200ML-% IV SOLN
1000.0000 mg | INTRAVENOUS | Status: DC
  Administered 2020-11-02 – 2020-11-09 (×4): 1000 mg via INTRAVENOUS
  Filled 2020-10-31 (×7): qty 200

## 2020-10-31 MED ORDER — VANCOMYCIN HCL 2000 MG/400ML IV SOLN
2000.0000 mg | Freq: Once | INTRAVENOUS | Status: AC
  Administered 2020-10-31: 2000 mg via INTRAVENOUS
  Filled 2020-10-31: qty 400

## 2020-10-31 MED ORDER — EPOETIN ALFA 10000 UNIT/ML IJ SOLN
10000.0000 [IU] | INTRAMUSCULAR | Status: DC
Start: 2020-11-02 — End: 2020-11-10
  Administered 2020-11-02 – 2020-11-09 (×4): 10000 [IU] via INTRAVENOUS
  Filled 2020-10-31 (×3): qty 1

## 2020-10-31 MED ORDER — OXYMETAZOLINE HCL 0.05 % NA SOLN
4.0000 | NASAL | Status: AC | PRN
  Filled 2020-10-31: qty 15

## 2020-10-31 MED ORDER — CALCIUM CARBONATE 1250 (500 CA) MG PO TABS
1.0000 | ORAL_TABLET | Freq: Three times a day (TID) | ORAL | Status: DC
  Administered 2020-10-31 – 2020-11-10 (×28): 500 mg via ORAL
  Filled 2020-10-31 (×34): qty 1

## 2020-10-31 MED ORDER — SODIUM CHLORIDE 0.9 % IV SOLN
400.0000 mg | Freq: Once | INTRAVENOUS | Status: AC
  Administered 2020-10-31: 400 mg via INTRAVENOUS
  Filled 2020-10-31: qty 20

## 2020-10-31 NOTE — Progress Notes (Signed)
Pharmacy Antibiotic Note  [name] is a 50 y.o. female admitted on 10/15/2020 with Diabetic foot infection with gangrene and osteomyelitis of 5th toe. S/P amputation on 8/27 and wash out and debridement 10/26/20. Patient has PMH of ESRD on HD. Per provider, patient keeps losing IV access and needs medication that can be given during dialysis. ID has recommended change from Zosyn to Ceftazidime. Pharmacy has been consulted for Ceftazidime dosing.  Plan: Per discussion with ID,  add vancomycin for better streptococcus and enterococcus coverage that ceftazidime lacks.  Vancomycin 2.5gm IV x 1 then 1gm IV qHD TTS Check pre-HD level prior to 3-4th HD session.  Goal level 15-25 mcg/mL Ceftazidime 1g IV x 1 dose this evening followed by ceftazidime 1g IV with HD on HD days.  Plan is patient will receive IV antibiotics at discharge (received at HD center)  Height: 5' 4.5" (163.8 cm) Weight: 126.1 kg (278 lb) IBW/kg (Calculated) : 55.85  Temp (24hrs), Avg:98.1 F (36.7 C), Min:97.5 F (36.4 C), Max:98.5 F (36.9 C)  Recent Labs  Lab 10/25/20 0410 10/26/20 0439 10/26/20 1619 10/29/20 0523 10/31/20 0408  WBC 25.9* 25.4*  --  19.8* 15.3*  CREATININE 6.74* 8.01* 6.20* 7.04* 7.53*     Estimated Creatinine Clearance: 12 mL/min (A) (by C-G formula based on SCr of 7.53 mg/dL (H)).    Allergies  Allergen Reactions   Dust Mite Extract Shortness Of Breath and Itching   Mold Extract [Trichophyton] Shortness Of Breath and Itching   Pollen Extract Shortness Of Breath    Itching, shortness of breath, asthma like symptoms   Sulfa Antibiotics Itching   Feraheme [Ferumoxytol] Itching    Uses benadryl so she can get the medicine    Antimicrobials this admission: 8/21 Ceftriaxone x 1 8/21 vancomycin>> 8/25 8/22 Cefepime>>8/25 8/26 Unasyn>> 8/29 8/26 Zosyn>> 9/4 9/4 Ceftazidime>>   Microbiology results: 9/1 aerobic/anaerobic foot Cx:  RARE STREPTOCOCCUS ANGINOSIS  8/23 Wound Cx:  ENTEROCOCCUS FAECALIS, KLEBSIELLA OXYTOCA  Thank you for allowing pharmacy to be a part of this patient's care.  Doreene Eland, PharmD, BCPS.   Work Cell: [phone] 10/31/2020 4:05 PM

## 2020-10-31 NOTE — Progress Notes (Signed)
Noted improvement with patient blood pressure with cuff readjustment, pt is alert and tx completed. successfully pulled off 3kg and patient post vitals were stable.

## 2020-10-31 NOTE — Progress Notes (Signed)
[facility]  ROUNDING NOTE   Subjective:   [name] was admitted to [facility] on 10/15/2020 for Gangrene [facility]) [I96] Diabetic foot ulcer ([place]) [E11.621, L97.509] Decreased pedal pulses [R09.89] Sepsis, due to unspecified organism, unspecified whether acute organ dysfunction present [facility]) [A41.9]  Patient seen during dialysis   HEMODIALYSIS FLOWSHEET:  Blood Flow Rate (mL/min): 200 mL/min Arterial Pressure (mmHg): -60 mmHg Venous Pressure (mmHg): 50 mmHg Transmembrane Pressure (mmHg): 70 mmHg Ultrafiltration Rate (mL/min): 70 mL/min Dialysate Flow Rate (mL/min): 500 ml/min Conductivity: Machine : 14 Conductivity: Machine : 14 Dialysis Fluid Bolus: Normal Saline Bolus Amount (mL): 200 mL  Right foot pain improved control today Continues to work on transfers with PT, pt weaker than expected   Objective:  Vital signs in last 24 hours:  Temp:  [97.5 F (36.4 C)-98.4 F (36.9 C)] 98.1 F (36.7 C) (09/06 1206) Pulse Rate:  [36-102] 102 (09/06 1200) Resp:  [13-23] 23 (09/06 1206) BP: (57-152)/(47-93) 98/48 (09/06 1206) SpO2:  [95 %-100 %] 100 % (09/06 1206)  Weight change:  Filed Weights   10/15/20 1905 10/18/20 0928  Weight: 126.1 kg 126.1 kg    Intake/Output: I/O last 3 completed shifts: In: 30 [P.O.:30] Out: 2000 [Other:2000]   Intake/Output this shift:  Total I/O In: -  Out: 3000 [Other:3000]  Physical Exam: General: NAD, laying in bed  Head: Normocephalic, atraumatic. Moist oral mucosal membranes  Eyes: Anicteric  Lungs:  Clear to auscultation, O2 2L  Heart: Regular rate and rhythm  Abdomen:  Soft, nontender  Extremities: [name] peripheral edema. Right foot fifth toe amputation on 10/21/20, ace wrapped  Neurologic: Nonfocal, moving all four extremities  Skin: No lesions  Access: Left AVF    Basic Metabolic Panel: Recent Labs  Lab 10/25/20 0410 10/26/20 0439 10/26/20 1619 10/29/20 0523 10/31/20 0408  NA 126* 124* 130* 129* 127*   K 3.9 4.4 4.0 3.8 3.8  CL 87* 86* 95* 91* 90*  CO2 23 21*  --  23 23  GLUCOSE 199* 257* 143* 277* 266*  BUN 51* 67* 36* 43* 48*  CREATININE 6.74* 8.01* 6.20* 7.04* 7.53*  CALCIUM 7.0* 6.9*  --  6.9* 6.4*  MG  --  2.0  --   --   --   PHOS  --   --   --  5.6*  --      Liver Function Tests: Recent Labs  Lab 10/25/20 0410  AST 561*  ALT 167*  ALKPHOS 661*  BILITOT 1.2  PROT 6.8  ALBUMIN 2.3*    No results for input(s): LIPASE, AMYLASE in the last 168 hours. No results for input(s): AMMONIA in the last 168 hours.  CBC: Recent Labs  Lab 10/25/20 0410 10/26/20 0439 10/26/20 1619 10/29/20 0523 10/31/20 0408  WBC 25.9* 25.4*  --  19.8* 15.3*  NEUTROABS  --   --   --   --  12.6*  HGB 7.9* 8.4* 9.9* 8.1* 7.5*  HCT 24.1* 26.0* 29.0* 25.3* 23.4*  MCV 94.5 92.5  --  95.5 95.5  PLT 389 390  --  341 296     Cardiac Enzymes: No results for input(s): CKTOTAL, CKMB, CKMBINDEX, TROPONINI in the last 168 hours.  BNP: Invalid input(s): POCBNP  CBG: Recent Labs  Lab 10/30/20 1338 10/30/20 1549 10/30/20 2014 10/31/20 0735 10/31/20 1311  GLUCAP 180* 254* 256* 176* 174*     Microbiology: Results for orders placed or performed during the hospital encounter of 10/15/20  Blood Culture (routine x  2)     Status: None   Collection Time: 10/15/20  8:03 PM   Specimen: BLOOD  Result Value Ref Range Status   Specimen Description BLOOD BLOOD RIGHT FOREARM  Final   Special Requests   Final    BOTTLES DRAWN AEROBIC AND ANAEROBIC Blood Culture adequate volume   Culture   Final    NO GROWTH 5 DAYS Performed at [facility], [address] [place], [place] [address]    Report Status 10/20/2020 FINAL  Final  Resp Panel by RT-PCR (Flu A&B, Covid) Nasopharyngeal Swab     Status: None   Collection Time: 10/15/20  8:26 PM   Specimen: Nasopharyngeal Swab; Nasopharyngeal(NP) swabs in vial transport medium  Result Value Ref Range Status   SARS Coronavirus 2 by RT PCR  NEGATIVE NEGATIVE Final    Comment: (NOTE) SARS-CoV-2 target nucleic acids are NOT DETECTED.  The SARS-CoV-2 RNA is generally detectable in upper respiratory specimens during the acute phase of infection. The lowest concentration of SARS-CoV-2 viral copies this assay can detect is 138 copies/mL. A negative result does not preclude SARS-Cov-2 infection and should not be used as the sole basis for treatment or other patient management decisions. A negative result may occur with  improper specimen collection/handling, submission of specimen other than nasopharyngeal swab, presence of viral mutation(s) within the areas targeted by this assay, and inadequate number of viral copies(<138 copies/mL). A negative result must be combined with clinical observations, patient history, and epidemiological information. The expected result is Negative.  Fact Sheet for Patients:  EntrepreneurPulse.com.au  Fact Sheet for Healthcare Providers:  IncredibleEmployment.be  This test is no t yet approved or cleared by the [place] FDA and  has been authorized for detection and/or diagnosis of SARS-CoV-2 by FDA under an Emergency Use Authorization (EUA). This EUA will remain  in effect (meaning this test can be used) for the duration of the COVID-19 declaration under Section 564(b)(1) of the Act, 21 U.S.C.section 360bbb-3(b)(1), unless the authorization is terminated  or revoked sooner.       Influenza A by PCR NEGATIVE NEGATIVE Final   Influenza B by PCR NEGATIVE NEGATIVE Final    Comment: (NOTE) The Xpert Xpress SARS-CoV-2/FLU/RSV plus assay is intended as an aid in the diagnosis of influenza from Nasopharyngeal swab specimens and should not be used as a sole basis for treatment. Nasal washings and aspirates are unacceptable for Xpert Xpress SARS-CoV-2/FLU/RSV testing.  Fact Sheet for Patients: EntrepreneurPulse.com.au  Fact Sheet for  Healthcare Providers: IncredibleEmployment.be  This test is not yet approved or cleared by the [place] FDA and has been authorized for detection and/or diagnosis of SARS-CoV-2 by FDA under an Emergency Use Authorization (EUA). This EUA will remain in effect (meaning this test can be used) for the duration of the COVID-19 declaration under Section 564(b)(1) of the Act, 21 U.S.C. section 360bbb-3(b)(1), unless the authorization is terminated or revoked.  Performed at [facility], [address] [place], [place] [address]   Blood Culture (routine x 2)     Status: None   Collection Time: 10/15/20  8:26 PM   Specimen: BLOOD  Result Value Ref Range Status   Specimen Description BLOOD RIGHT ANTECUBITAL  Final   Special Requests   Final    BOTTLES DRAWN AEROBIC AND ANAEROBIC Blood Culture adequate volume   Culture   Final    NO GROWTH 5 DAYS Performed at [facility], [address], [place], [place] [address]  Report Status 10/20/2020 FINAL  Final  Aerobic Culture w Gram Stain (superficial specimen)     Status: None   Collection Time: 10/17/20  8:52 PM   Specimen: Wound  Result Value Ref Range Status   Specimen Description WOUND  Final   Special Requests NONE  Final   Gram Stain   Final    RARE SQUAMOUS EPITHELIAL CELLS PRESENT FEW WBC PRESENT, PREDOMINANTLY MONONUCLEAR ABUNDANT GRAM VARIABLE ROD FEW GRAM POSITIVE COCCI    Culture   Final    MODERATE ENTEROCOCCUS FAECALIS MODERATE KLEBSIELLA OXYTOCA ABUNDANT DIPHTHEROIDS(CORYNEBACTERIUM SPECIES) Standardized susceptibility testing for this organism is not available.    Report Status 10/20/2020 FINAL  Final   Organism ID, Bacteria ENTEROCOCCUS FAECALIS  Final   Organism ID, Bacteria KLEBSIELLA OXYTOCA  Final      Susceptibility   Enterococcus faecalis - MIC*    AMPICILLIN <=2 SENSITIVE Sensitive     VANCOMYCIN 1 SENSITIVE Sensitive     GENTAMICIN SYNERGY RESISTANT Resistant      * MODERATE ENTEROCOCCUS FAECALIS   Klebsiella oxytoca - MIC*    AMPICILLIN >=32 RESISTANT Resistant     CEFAZOLIN <=4 SENSITIVE Sensitive     CEFEPIME <=0.12 SENSITIVE Sensitive     CEFTAZIDIME <=1 SENSITIVE Sensitive     CEFTRIAXONE <=0.25 SENSITIVE Sensitive     CIPROFLOXACIN <=0.25 SENSITIVE Sensitive     GENTAMICIN <=1 SENSITIVE Sensitive     IMIPENEM <=0.25 SENSITIVE Sensitive     TRIMETH/SULFA <=20 SENSITIVE Sensitive     AMPICILLIN/SULBACTAM 8 SENSITIVE Sensitive     PIP/TAZO <=4 SENSITIVE Sensitive     * MODERATE KLEBSIELLA OXYTOCA  Aerobic/Anaerobic Culture w Gram Stain (surgical/deep wound)     Status: None   Collection Time: 10/21/20  3:07 PM   Specimen: PATH Digit amputation; Tissue  Result Value Ref Range Status   Specimen Description   Final    FOOT  RIGHT 5TH DIGIT AMPUTATION Performed at [facility], [address], [place], [place] [address]    Special Requests   Final    NONE Performed at [facility], [place], [place] [address]    Gram Stain   Final    FEW SQUAMOUS EPITHELIAL CELLS PRESENT FEW WBC SEEN FEW GRAM POSITIVE COCCI    Culture   Final    FEW STREPTOCOCCUS ANGINOSIS NO ANAEROBES ISOLATED Performed at [facility], [place] [address], [place], [place] [address]    Report Status 10/27/2020 FINAL  Final   Organism ID, Bacteria STREPTOCOCCUS ANGINOSIS  Final      Susceptibility   Streptococcus anginosis - MIC*    PENICILLIN 0.12 SENSITIVE Sensitive     CEFTRIAXONE 0.5 SENSITIVE Sensitive     ERYTHROMYCIN 4 RESISTANT Resistant     LEVOFLOXACIN 0.5 SENSITIVE Sensitive     VANCOMYCIN 0.5 SENSITIVE Sensitive     * FEW STREPTOCOCCUS ANGINOSIS  Aerobic/Anaerobic Culture w Gram Stain (surgical/deep wound)     Status: None   Collection Time: 10/26/20  5:35 PM   Specimen: PATH Other; Tissue  Result Value Ref Range Status   Specimen Description   Final    FOOT RIGHT Performed at [facility], [address] [place], [place] [address]    Special Requests   Final    NONE Performed at [facility], [address] [place], [place] [address]    Gram Stain NO WBC SEEN NO ORGANISMS SEEN   Final   Culture   Final  RARE STREPTOCOCCUS ANGINOSIS NO ANAEROBES ISOLATED Performed at [facility], [place] [address] [place], [place] [address]    Report Status 10/31/2020 FINAL  Final   Organism ID, Bacteria STREPTOCOCCUS ANGINOSIS  Final      Susceptibility   Streptococcus anginosis - MIC*    PENICILLIN 0.12 SENSITIVE Sensitive     CEFTRIAXONE 0.5 SENSITIVE Sensitive     ERYTHROMYCIN >=8 RESISTANT Resistant     LEVOFLOXACIN 0.5 SENSITIVE Sensitive     VANCOMYCIN 0.5 SENSITIVE Sensitive     * RARE STREPTOCOCCUS ANGINOSIS    Coagulation Studies: No results for input(s): LABPROT, INR in the last 72 hours.   Urinalysis: No results for input(s): COLORURINE, LABSPEC, PHURINE, GLUCOSEU, HGBUR, BILIRUBINUR, KETONESUR, PROTEINUR, UROBILINOGEN, NITRITE, LEUKOCYTESUR in the last 72 hours.  Invalid input(s): APPERANCEUR    Imaging: No results found.   Medications:    sodium chloride 0 mL/hr at 10/22/20 1200   cefTAZidime (FORTAZ)  IV 1 g (10/31/20 1115)   iron sucrose 400 mg (10/31/20 1336)    vitamin C  500 mg Oral Daily   aspirin EC  81 mg Oral Daily   calcium carbonate  1 tablet Oral TID with meals   Chlorhexidine Gluconate Cloth  6 each Topical Daily   cholecalciferol  4,000 Units Oral Weekly   cinacalcet  60 mg Oral Daily   ciprofloxacin-dexamethasone  4 drop Both EARS BID   docusate sodium  100 mg Oral Daily   feeding supplement (NEPRO CARB STEADY)  237 mL Oral BID BM   ferric citrate  210 mg Oral TID WC   heparin  5,000 Units Subcutaneous Q8H   insulin aspart  0-5 Units Subcutaneous QHS   insulin aspart  0-9 Units Subcutaneous TID WC   insulin aspart  2 Units Subcutaneous TID WC   insulin glargine-yfgn  40 Units Subcutaneous QHS   ipratropium-albuterol   3 mL Nebulization TID   linaclotide  290 mcg Oral q morning   loratadine  10 mg Oral Daily   metroNIDAZOLE  500 mg Oral Q12H   mometasone-formoterol  2 puff Inhalation BID   multivitamin  1 tablet Oral QHS   pantoprazole  40 mg Oral Daily   sodium chloride flush  3 mL Intravenous Q12H   sodium chloride, acetaminophen, albuterol, albuterol, ALPRAZolam, alum & mag hydroxide-simeth, calcium carbonate, dextromethorphan-guaiFENesin, diphenhydrAMINE, hydrALAZINE, lactulose, metoprolol tartrate, ondansetron (ZOFRAN) IV, ondansetron, oxyCODONE, oxyCODONE, oxymetazoline, phenol, polyethylene glycol, sodium chloride flush, traZODone  Assessment/ Plan:  [name] is a 50 y.o. black female with end stage renal disease on hemodialysis, diabetes mellitus type II, hypertension, congestive heart failure, COPD, who is admitted to [facility] on 10/15/2020 for Gangrene [facility]) [I96] Diabetic foot ulcer ([place]) [K27.062, L97.509] Decreased pedal pulses [R09.89] Sepsis, due to unspecified organism, unspecified whether acute organ dysfunction present ([place]) [A41.9]  [facility] Left AVF 127.5kg  End Stage Renal Disease: Received dialysis today, UF goal 3L achieved. Next treatment scheduled for Thursday.  Hypotension: BP stable during treatment.  Anemia with chronic kidney disease: Hgb 7.5, EPO given during treatment  Secondary Hyperparathyroidism with hyeprphosphatemia: there is risk for calciphylaxis. Outpatient labs on 8/9: phos 10.6, calcium 8.2, PTH 1456.  -Continue Cinacalcet and Renvela. -Calcium 6.4, calcium carbonate 1 tab TID ordered  Diabetes mellitus type II with chronic kidney disease: insulin dependent. Complicated with diabetic foot ulcer. History of poor control. Hemoglobin A1c 9.2% on 09/19/2020.  - Appreciate podiatry and vascular input.  - Vascular performed right lower  angiogram. 30% mild lesion found popliteal artery. Deemed adequate for wound healing.  - Podiatry performed  right foot, partial fifth toe amputation on 10/21/20. Dressing changes determine need for further amputation. Patient requesting second opinion.  - Currently receiving Metronidazole and Fortaz. Tressie Ellis given with dialysis.    6.    Hyponatremia: Will monitor am labs    LOS: [place] 9/6/20222:14 PM

## 2020-10-31 NOTE — Progress Notes (Signed)
Patient transitioned to [facility], full report given

## 2020-10-31 NOTE — Progress Notes (Signed)
Physical Therapy Treatment Patient Details Name: [name] MRN: [id number] DOB: 12/19/1970 Today's Date: 10/31/2020    History of Present Illness [name]  is a 50 y.o. female presenting to hospital 8/21 with chest pain, fever, fatigue, and generalized malaise; also foul smell from R great toe (chronic ulcer).  Pt admitted with diabetic foot ulcers with gangrene of the 5th toe and osteomyelitis. S/p R LE angio 8/24; s/p partial R foot 5th toe amp 8/27; and s/p I&D with revisional debridement R foot 9/1.  Per PT order 9/2: pt [place] R foot. PMH includes DM, ESRD, anxiety, CHF, CKD, PNA, chronic respiratory failure with hypoxia and hypercapnia.    PT Comments    Author returns to attempt PT treatment in late afternoon given AM HD session. Pt has had low MAP since return to floor, 65 initially, marginally higher later. Pt reports feeling groggy and anxious, has been sitting up in bed. Pt agreeable to session. Bed mobility performed at supervision level, author managing lines/leads for safety. Pt takes part in A/ROM, AA/ROM, and AR/ROM for exercises at EOB. Pt requires some brief recovery intervals between each set of exercises. Hip extension strength is significantly weak and susceptible to fatigue after 10 reps, unlike hip flexion which is more resilient over time. Pt still short on insight at this time regarding her very limited options for mobility going forward. Author continued to educated on 6+ weeks of NWB as a minimum estimation, inability to use STS lift given NWB status, dire need to learn slide board transfers given inability to stand, mechanical lift transfers as her only realistic option at present given her inability to perform STS or SPT as of yet. Husband can physically assist patient with transfers to some degree but given her need for >modA, this is not a realistic long-term option. Will continue to maximize mobility training.   Follow Up Recommendations  SNF;Supervision for  mobility/OOB     Equipment Recommendations   (unknown at this time)    Recommendations for Other Services OT consult     Precautions / Restrictions Precautions Precautions: Fall Restrictions Weight Bearing Restrictions: Yes RLE Weight Bearing: Non weight bearing Other Position/Activity Restrictions: [place] R foot    Mobility  Bed Mobility Overal bed mobility: Needs Assistance Bed Mobility: Supine to Sit     Supine to sit: Supervision Sit to supine: Supervision   General bed mobility comments: supervision for supine to long sitting, long sitting to EOB short sitting; EOB to long sitting in bed.    Transfers Overall transfer level:  (deferred, unsafe at this time)                  Ambulation/Gait                 Stairs             Wheelchair Mobility    Modified Rankin (Stroke Patients Only)       Balance Overall balance assessment: Modified Independent;Mild deficits observed, not formally tested                                          Cognition  Exercises Other Exercises Other Exercises: SEated LAQ 1x15 bilat; seated Marching 1x15 bilat (AA/ROM; seated hip extension AR/ROM at EOB 1x15 bilat (on Right resistance applied at distal thigh with gait belt) Other Exercises: unsupported sitting EOB x15 minutes for pulmonary hygiene and core endurance    General Comments        Pertinent Vitals/Pain      Home Living                      Prior Function            PT Goals (current goals can now be found in the care plan section) Acute Rehab PT Goals Patient Stated Goal: to improve mobility PT Goal Formulation: With patient Time For Goal Achievement: 11/10/20 Potential to Achieve Goals: Poor Progress towards PT goals: Not progressing toward goals - comment    Frequency    Min 2X/week      PT Plan Current plan remains appropriate     Co-evaluation              AM-PAC PT "6 Clicks" Mobility   Outcome Measure  Help needed turning from your back to your side while in a flat bed without using bedrails?: A Little Help needed moving from lying on your back to sitting on the side of a flat bed without using bedrails?: A Little Help needed moving to and from a bed to a chair (including a wheelchair)?: Total Help needed standing up from a chair using your arms (e.g., wheelchair or bedside chair)?: Total Help needed to walk in hospital room?: Total Help needed climbing 3-5 steps with a railing? : Total 6 Click Score: 10    End of Session Equipment Utilized During Treatment: Gait belt;Oxygen Activity Tolerance: Patient tolerated treatment well;Patient limited by fatigue;Treatment limited secondary to medical complications (Comment) (Ca++, post HD hypotension, MAP today between 65-68.) Patient left: in bed;with call bell/phone within reach;with bed alarm set;with nursing/sitter in room Nurse Communication: Mobility status;Precautions;Weight bearing status PT Visit Diagnosis: Other abnormalities of gait and mobility (R26.89);Muscle weakness (generalized) (M62.81);Pain Pain - Right/Left: Right Pain - part of body: Ankle and joints of foot     Time: 1600-1630 PT Time Calculation (min) (ACUTE ONLY): 30 min  Charges:  $Therapeutic Exercise: 23-37 mins                    4:49 PM, 10/31/20 Etta Grandchild, PT, DPT Physical Therapist - [facility]  [phone] ([place])     [place] C 10/31/2020, 4:42 PM

## 2020-10-31 NOTE — Progress Notes (Signed)
Patient ID: [name], female   DOB: 1970/06/12, 50 y.o.   MRN: [id number] [facility] PROGRESS NOTE  [name] OJJ:[id number] DOB: [birth year] DOA: 10/15/2020 PCP: [name], NP  HPI/Subjective: Patient still has some pain in her right foot.  Still holding off on decision for amputation at this point.  Objective: Vitals:   10/31/20 1200 10/31/20 1206  BP: (!) 57/47 (!) 98/48  Pulse: (!) 102   Resp: 17 (!) 23  Temp:  98.1 F (36.7 C)  SpO2: 100% 100%    Intake/Output Summary (Last 24 hours) at 10/31/2020 1310 Last data filed at 10/31/2020 1206 Gross per 24 hour  Intake --  Output 3000 ml  Net -3000 ml   Filed Weights   10/15/20 1905 10/18/20 0928  Weight: 126.1 kg 126.1 kg    ROS: Review of Systems  Respiratory:  Negative for shortness of breath.   Cardiovascular:  Negative for chest pain.  Gastrointestinal:  Negative for abdominal pain, nausea and vomiting.  Musculoskeletal:  Positive for joint pain.  Exam: Physical Exam HENT:     Head: Normocephalic.     Mouth/Throat:     Pharynx: No oropharyngeal exudate.  Eyes:     General: Lids are normal.     Conjunctiva/sclera: Conjunctivae normal.  Cardiovascular:     Rate and Rhythm: Normal rate and regular rhythm.     Heart sounds: Normal heart sounds, S1 normal and S2 normal.  Pulmonary:     Breath sounds: No decreased breath sounds, wheezing, rhonchi or rales.  Abdominal:     Palpations: Abdomen is soft.     Tenderness: There is no abdominal tenderness.  Musculoskeletal:     Right ankle: Swelling present.     Left ankle: Swelling present.  Skin:    General: Skin is warm.     Comments: Lower leg some blackish discoloration little dots could be calciphylaxis  Neurological:     Mental Status: She is alert and oriented to person, place, and time.           Scheduled Meds:  vitamin C  500 mg Oral Daily   aspirin EC  81 mg Oral Daily   calcium carbonate  1 tablet Oral TID with meals    Chlorhexidine Gluconate Cloth  6 each Topical Daily   cholecalciferol  4,000 Units Oral Weekly   cinacalcet  60 mg Oral Daily   ciprofloxacin-dexamethasone  4 drop Both EARS BID   docusate sodium  100 mg Oral Daily   feeding supplement (NEPRO CARB STEADY)  237 mL Oral BID BM   ferric citrate  210 mg Oral TID WC   heparin  5,000 Units Subcutaneous Q8H   insulin aspart  0-5 Units Subcutaneous QHS   insulin aspart  0-9 Units Subcutaneous TID WC   insulin aspart  2 Units Subcutaneous TID WC   insulin glargine-yfgn  40 Units Subcutaneous QHS   ipratropium-albuterol  3 mL Nebulization TID   linaclotide  290 mcg Oral q morning   loratadine  10 mg Oral Daily   metroNIDAZOLE  500 mg Oral Q12H   mometasone-formoterol  2 puff Inhalation BID   multivitamin  1 tablet Oral QHS   pantoprazole  40 mg Oral Daily   sodium chloride flush  3 mL Intravenous Q12H   Continuous Infusions:  sodium chloride 0 mL/hr at 10/22/20 1200   cefTAZidime (FORTAZ)  IV 1 g (10/31/20 1115)   iron sucrose     Brief history.  Patient  admitted 15 days ago with diabetic foot ulcers with gangrene of the fifth toe and osteomyelitis.  Past medical history chronic diastolic congestive heart failure, chronic hypoxic respiratory failure on 3 to 4 L of oxygen, end-stage renal disease with diabetes, morbid obesity.  On 10/21/2020 had amputation of the fifth ray and I&D of abscess on the right foot.  On 10/26/2020 had washout procedure.  The patient initially was on IV Zosyn.  Switched over to ceftazidime with dosing with dialysis at this point.  Specialist had recommended amputation but patient is not ready to make that decision at this point in time.  Assessment/Plan:  Diabetic foot ulcers with gangrene of the fifth toe and osteomyelitis.  On 10/21/2020 patient had an amputation of the fifth ray and I&D abscess of the right foot.  On 10/26/2020 had washout procedure.  With poor IV access antibiotics switched over to ceftazidime with  dialysis.  ID and podiatry following.  If patient decides on an amputation, can reconsult vascular surgery. Type 2 diabetes mellitus with end-stage renal disease.  Continue dialysis Tuesday Thursday Saturday.  Had extra dialysis session yesterday.  I increase glargine insulin to 40 units at night and continue sliding scale and short acting insulin prior to meals. Anemia of chronic disease.  Hemoglobin today 7.5.  Continue to monitor. Peripheral vascular disease.  Angiogram 8/24 showed popliteal artery mid lesion 30%.  Continue aspirin Hyponatremia managed with hemodialysis Chronic hypoxic respiratory failure.  Continue oxygen 3 to 4 L. Morbid obesity Lower extremity blackness lesions looks like calciphylaxis to me Weakness.  Continue physical therapy evaluations.  Patient is nonweightbearing on the right foot at this point. Hearing loss.  Continue Claritin.  May need ENT as outpatient for hearing test.  The patient wanted me to and prescribed eardrops for her.     Code Status:     Code Status Orders  (From admission, onward)           Start     Ordered   10/15/20 2114  Full code  Continuous        10/15/20 2119           Code Status History     Date Active Date Inactive Code Status Order ID Comments User Context   03/24/2017 1953 04/28/2017 1906 Full Code [id number]  Hillary Bow, MD ED   03/03/2017 1420 03/10/2017 2131 Full Code [id number]  Fritzi Mandes, MD ED   01/07/2017 1725 01/10/2017 1900 Full Code [id number]  Demetrios Loll, MD Inpatient   11/08/2016 0450 11/16/2016 1717 Full Code [id number]  Hugelmeyer, Alexis, DO Inpatient   06/25/2016 1447 06/29/2016 1815 Full Code [id number]  Hillary Bow, MD ED   08/09/2015 1757 08/13/2015 1721 Full Code [id number]  Bettey Costa, MD Inpatient   12/31/2014 2025 01/03/2015 1749 Full Code [id number]  Hower, Aaron Mose, MD ED   12/15/2014 1154 12/19/2014 1657 Full Code [id number]  Hillary Bow, MD ED      Family Communication: Spoke with husband at the  bedside Disposition Plan: Status is: Inpatient  Dispo: The patient is from: Home              Anticipated d/c is to: Unclear at this point              Patient currently on IV antibiotics for severe right lower extremity infection.   Difficult to place patient.  No.  So  Consultants: Podiatry Infectious disease Nephrology  Procedures: 8/24 angiogram right lower extremity 8/27 amputation of the fifth ray  and I&D of abscess of the right foot 9/1 washout procedure  Antibiotics: Ceftazidime with dialysis  Time spent: 27 minutes  [place]

## 2020-10-31 NOTE — Progress Notes (Signed)
Noted decrease in pt bp, pt is alert states she feels okay. Pt cuff is loose, will readjust and recheck blood pressure. Pt has 6 Mins left of treatment.

## 2020-11-01 DIAGNOSIS — E11621 Type 2 diabetes mellitus with foot ulcer: Secondary | ICD-10-CM | POA: Diagnosis not present

## 2020-11-01 DIAGNOSIS — L089 Local infection of the skin and subcutaneous tissue, unspecified: Secondary | ICD-10-CM | POA: Diagnosis not present

## 2020-11-01 DIAGNOSIS — E11628 Type 2 diabetes mellitus with other skin complications: Secondary | ICD-10-CM | POA: Diagnosis not present

## 2020-11-01 DIAGNOSIS — L97514 Non-pressure chronic ulcer of other part of right foot with necrosis of bone: Secondary | ICD-10-CM | POA: Diagnosis not present

## 2020-11-01 LAB — GLUCOSE, CAPILLARY
Glucose-Capillary: 218 mg/dL — ABNORMAL HIGH (ref 70–99)
Glucose-Capillary: 221 mg/dL — ABNORMAL HIGH (ref 70–99)
Glucose-Capillary: 195 mg/dL — ABNORMAL HIGH (ref 70–99)
Glucose-Capillary: 260 mg/dL — ABNORMAL HIGH (ref 70–99)
Glucose-Capillary: 258 mg/dL — ABNORMAL HIGH (ref 70–99)
Glucose-Capillary: 104 mg/dL — ABNORMAL HIGH (ref 70–99)

## 2020-11-01 MED ORDER — SODIUM CHLORIDE 0.9 % IV SOLN
1.0000 g | INTRAVENOUS | Status: DC
  Filled 2020-11-01 (×2): qty 1

## 2020-11-01 MED ORDER — SODIUM CHLORIDE 0.9 % IV SOLN
2.0000 g | INTRAVENOUS | Status: DC
  Administered 2020-11-04: 2 g via INTRAVENOUS
  Filled 2020-11-01 (×3): qty 2

## 2020-11-01 MED ORDER — SODIUM CHLORIDE 0.9 % IV SOLN
1.0000 g | INTRAVENOUS | Status: DC
  Administered 2020-11-07: 1 g via INTRAVENOUS
  Filled 2020-11-01 (×2): qty 1

## 2020-11-01 MED ORDER — HYDROMORPHONE HCL 1 MG/ML IJ SOLN
0.5000 mg | INTRAMUSCULAR | Status: DC | PRN
  Administered 2020-11-01 – 2020-11-10 (×13): 0.5 mg via INTRAVENOUS
  Filled 2020-11-01 (×13): qty 1

## 2020-11-01 NOTE — Progress Notes (Signed)
Date of Admission:  10/15/2020     ID: [name] is a 50 y.o. female  Principal Problem:   Diabetic foot ulcer ([place]) Active Problems:   Diabetes ([place])   Obesity, Class III, BMI 40-49.9 (morbid obesity) ([place])   Essential hypertension   Type 2 diabetes mellitus with ESRD (end-stage renal disease) ([place])   DNR (do not resuscitate) discussion   End-stage renal disease on hemodialysis ([place])   Chronic respiratory failure with hypoxia ([facility])   Decreased pedal pulses   Gangrene (H[facility])   PVD (peripheral vascular disease) ([facility])   Aspiration into airway   Hearing loss of right ear   Hyponatremia   Anemia of chronic disease    Subjective: Pt has no specific complaints No fever No pain at rest No sob No pain abdomen   Medications:   vitamin C  500 mg Oral Daily   aspirin EC  81 mg Oral Daily   calcium carbonate  1 tablet Oral TID with meals   Chlorhexidine Gluconate Cloth  6 each Topical Daily   cholecalciferol  4,000 Units Oral Weekly   cinacalcet  60 mg Oral Daily   ciprofloxacin-dexamethasone  4 drop Both EARS BID   docusate sodium  100 mg Oral Daily   [START ON 11/02/2020] epoetin (EPOGEN/PROCRIT) injection  10,000 Units Intravenous Q T,Th,Sa-HD   feeding supplement (NEPRO CARB STEADY)  237 mL Oral BID BM   ferric citrate  210 mg Oral TID WC   heparin  5,000 Units Subcutaneous Q8H   insulin aspart  0-5 Units Subcutaneous QHS   insulin aspart  0-9 Units Subcutaneous TID WC   insulin aspart  2 Units Subcutaneous TID WC   insulin glargine-yfgn  40 Units Subcutaneous QHS   ipratropium-albuterol  3 mL Nebulization TID   linaclotide  290 mcg Oral q morning   loratadine  10 mg Oral Daily   metroNIDAZOLE  500 mg Oral Q12H   mometasone-formoterol  2 puff Inhalation BID   multivitamin  1 tablet Oral QHS   pantoprazole  40 mg Oral Daily   sodium chloride flush  3 mL Intravenous Q12H    Objective: Vital signs in last 24 hours: Temp:  [97.9 F (36.6 C)-98.5 F (36.9 C)]  97.9 F (36.6 C) (09/07 1148) Pulse Rate:  [92-102] 102 (09/07 1148) Resp:  [15-20] 16 (09/07 1148) BP: (107-129)/(44-80) 107/80 (09/07 1148) SpO2:  [93 %-100 %] 100 % (09/07 1148)  PHYSICAL EXAM:  General: Alert, cooperative, no distress, appears stated age.  Lungs: Clear to auscultation bilaterally. No Wheezing or Rhonchi. No rales. Heart: Regular rate and rhythm, no murmur, rub or gallop. Abdomen: Soft, non-tender,not distended. Bowel sounds normal. No masses Extremities: rt foot       Skin: No rashes or lesions. Or bruising Lymph: Cervical, supraclavicular normal. Neurologic: Grossly non-focal  Lab Results Recent Labs    10/31/20 0408  WBC 15.3*  HGB 7.5*  HCT 23.4*  NA 127*  K 3.8  CL 90*  CO2 23  BUN 48*  CREATININE 7.53*    Microbiology: Wound culture- streptococcus anginosis, enterococcis, klebsiella oxytoca   Assessment/Plan:  Diabetic foot infection with necrotic infection of the rt foot with osteo and deep space abscess- s/p 5th toe amputation , I/D of abscess  on 10/21/20 followed by debridement and washout on 9/1 Multiple organisms cultured- on vanco/ceftazidime and flagyl. Will discontinue the flagyl as no anerobes cultured Pt  is not in favor of BKA  and wants to continue IV  antibiotics for some time to see whether there will be improvement  OPAT orders placed for antibiotics until 9/28  ESRD on dialysis  DM -management as per primary team Will follow her as OP ID will sign off- call if needed

## 2020-11-01 NOTE — Progress Notes (Signed)
Nutrition Follow-up  DOCUMENTATION CODES:  Morbid obesity  INTERVENTION:  Carb modified diet to maximize intake and meal selection choices. Continue Nepro Shake po BID, each supplement provides 425 kcal and 19 grams protein Vitamin C 500mg  daily for wound healing and HD Renavite daily for HD needs Request new weight  NUTRITION DIAGNOSIS:  Increased nutrient needs related to wound healing as evidenced by estimated needs.  GOAL:  Patient will meet greater than or equal to 90% of their needs  MONITOR:  PO intake, Supplement acceptance, Skin  REASON FOR ASSESSMENT:  LOS    ASSESSMENT:  50 y.o. female with past medical history including DM, ESRD on HD, CHF, diverticulosis, HTN, and COPD, presented to ED with fatigue, malaise, and worsening toe wound.  8/24 - angiogram  right lower extremity 8/27 - amputation of the fifth ray and I&D of abscess of the right foot 9/1 - washout procedure  Pt resting in bedside chair at the time of visit. Pt reports that she feels her appetite is better than it was, but still not great. Discussed the difficulties she has had with her diet order which has changed frequently after procedures. Discussed diet with attending, ok to change back to carb modified as pt's K has not been elevated, Na is low, and fluid is being removed consistently at HD.  Pt reports that she likes the nepro supplements. Discussed her increased needs with HD and with wound healing and encouraged her to continue trying to increase her oral intake and drink the supplements so adequate protein could be provided. Pt agreeable.   Average Meal Intake: 8/22-8/31: 33% intake x 3 recorded meals (0-50%) 9/1-9/7: 85% intake x 3 recorded meals  Nutritionally Relevant Medications: Scheduled Meds:  vitamin C  500 mg Oral Daily   calcium carbonate  1 tablet Oral TID with meals   cholecalciferol  4,000 Units Oral Weekly   cinacalcet  60 mg Oral Daily   docusate sodium  100 mg Oral Daily    feeding supplement (NEPRO CARB STEADY)  237 mL Oral BID BM   ferric citrate  210 mg Oral TID WC   insulin aspart  0-5 Units Subcutaneous QHS   insulin aspart  0-9 Units Subcutaneous TID WC   insulin aspart  2 Units Subcutaneous TID WC   insulin glargine-yfgn  40 Units Subcutaneous QHS   linaclotide  290 mcg Oral q morning   metroNIDAZOLE  500 mg Oral Q12H   multivitamin  1 tablet Oral QHS   pantoprazole  40 mg Oral Daily   Continuous Infusions:  cefTAZidime (FORTAZ)  IV 1 g (10/31/20 1115)   PRN Meds: alum & mag hydroxide-simeth, calcium carbonate, diphenhydrAMINE, lactulose, ondansetron, polyethylene glycol  Labs Reviewed: Na 126, Chloride 87 BUN 51, creatinine 6.74 SBG ranges from 127-305 mg/dL over the last 24 hours HgbA1c 8.9% (8/21)  NUTRITION - FOCUSED PHYSICAL EXAM: Defer to in-person assessment  Diet Order:   Diet Order             Diet Carb Modified Fluid consistency: Thin; Room service appropriate? Yes; Fluid restriction: 1800 mL Fluid  Diet effective now                  EDUCATION NEEDS:  No education needs have been identified at this time  Skin:  Skin Assessment: Skin Integrity Issues: Skin Integrity Issues:: Incisions, Diabetic Ulcer Diabetic Ulcer: right heel Incisions: right foot, 5th toe  Last BM:  9/5 - type 6  Height:  Ht Readings  from Last 1 Encounters:  10/18/20 5' 4.5" (1.638 m)   Weight:  Wt Readings from Last 1 Encounters:  10/18/20 126.1 kg    Ideal Body Weight:  56.8 kg  BMI:  Body mass index is 46.98 kg/m.  Estimated Nutritional Needs:  Kcal:  2000-2200 kcal/d Protein:  100-120 g/d Fluid:  1L+UOP   Ranell Patrick, RD, LDN Clinical Dietitian Pager on [place]

## 2020-11-01 NOTE — Progress Notes (Signed)
Physical Therapy Treatment Patient Details Name: [name] MRN: [id number] DOB: 05-22-1970 Today's Date: 11/01/2020    History of Present Illness [name]  is a 50 y.o. female presenting to hospital 8/21 with chest pain, fever, fatigue, and generalized malaise; also foul smell from R great toe (chronic ulcer).  Pt admitted with diabetic foot ulcers with gangrene of the 5th toe and osteomyelitis. S/p R LE angio 8/24; s/p partial R foot 5th toe amp 8/27; and s/p I&D with revisional debridement R foot 9/1.  Per PT order 9/2: pt [place] R foot. PMH includes DM, ESRD, anxiety, CHF, CKD, PNA, chronic respiratory failure with hypoxia and hypercapnia.    PT Comments    Pt in bed upon entry agreeable to session. Pain 7-8/10 RN bringing meds soon. ModI to EOB. Flow increased t0 4L/min for exertion as per baseline. Focused on lateral scoot transfers and mechanics at EOB. This activity is highly exerting however, pt has no balance difficulty. Pt upgraded to lateral scoot from bed recliner (no rotational component.) This is pt's first time OOB to chair since arrival. Pt educated on need to achieve this technique prior to transition to slide board transfers. Pt has been very anxious and reluctant to attempt slide board techniques due to fear of falling. Of note: lateral scoot requires a physical blocking of recliner, wheel locks alone not sufficient to stabilize chair. Author offered to return later to demonstrate set up to RN for return to bed.   Follow Up Recommendations  SNF;Supervision for mobility/OOB     Equipment Recommendations       Recommendations for Other Services       Precautions / Restrictions Precautions Precautions: Fall Restrictions Weight Bearing Restrictions: Yes RLE Weight Bearing: Non weight bearing Other Position/Activity Restrictions: NWB'ing R foot    Mobility  Bed Mobility Overal bed mobility: Modified Independent Bed Mobility: Supine to Sit     Supine to sit:  Modified independent (Device/Increase time)     General bed mobility comments: no woozieness this date    Transfers     Transfers: Lateral/Scoot Transfers Sit to Stand: Supervision        Lateral/Scoot Transfers: Supervision General transfer comment: extesnive cues for foot placement and contrlateral trunk lean; pt needs no cuing to maintain NWB, albeit she allows foot to rest on floor while seated.  Ambulation/Gait                 Stairs             Wheelchair Mobility    Modified Rankin (Stroke Patients Only)       Balance                                            Cognition Arousal/Alertness: Awake/alert Behavior During Therapy: WFL for tasks assessed/performed Overall Cognitive Status: Within Functional Limits for tasks assessed                                        Exercises Other Exercises Other Exercises: Lateral scoot training, center of bed to [facility]; rest interval, HOB to center of bed, rest, then center of bed to foot of bed; Other Exercises: foot of bed to recliner with drop arm (pt unable to make "uphill" transition, but from elevated height  can perform downhill into recliner (3 attempts required in total)    General Comments        Pertinent Vitals/Pain Pain Assessment: 0-10 Pain Score: 8  Pain Location: R foot Pain Intervention(s): Limited activity within patient's tolerance;Monitored during session;Patient requesting pain meds-RN notified;RN gave pain meds during session    Home Living                      Prior Function            PT Goals (current goals can now be found in the care plan section) Acute Rehab PT Goals Patient Stated Goal: to improve mobility PT Goal Formulation: With patient Time For Goal Achievement: 11/10/20 Potential to Achieve Goals: Poor Progress towards PT goals: Progressing toward goals    Frequency    Min 2X/week      PT Plan Current plan remains  appropriate    Co-evaluation              AM-PAC PT "6 Clicks" Mobility   Outcome Measure  Help needed turning from your back to your side while in a flat bed without using bedrails?: A Little Help needed moving from lying on your back to sitting on the side of a flat bed without using bedrails?: A Little Help needed moving to and from a bed to a chair (including a wheelchair)?: Total Help needed standing up from a chair using your arms (e.g., wheelchair or bedside chair)?: Total Help needed to walk in hospital room?: Total Help needed climbing 3-5 steps with a railing? : Total 6 Click Score: 10    End of Session Equipment Utilized During Treatment: Oxygen Activity Tolerance: Patient tolerated treatment well;Patient limited by fatigue;Patient limited by pain Patient left: in chair;with call bell/phone within reach Nurse Communication: Mobility status;Precautions PT Visit Diagnosis: Other abnormalities of gait and mobility (R26.89);Muscle weakness (generalized) (M62.81);Pain Pain - Right/Left: Right Pain - part of body: Ankle and joints of foot     Time: 1027-1101 PT Time Calculation (min) (ACUTE ONLY): 34 min  Charges:  $Therapeutic Exercise: 23-37 mins                    12:12 PM, 11/01/20 Etta Grandchild, PT, DPT Physical Therapist - [facility]  [phone] ([place])     [place] C 11/01/2020, 12:06 PM

## 2020-11-01 NOTE — NC FL2 (Signed)
[place] LEVEL OF CARE SCREENING TOOL     IDENTIFICATION  Patient Name: [name] Birthdate: 1970/04/04 Sex: female Admission Date (Current Location): 10/15/2020  [place] and [place] Number:  Engineering geologist and Address:  [facility], [address], [place], [place] [address]      Provider Number: [id number]  Attending Physician Name and Address:  Sidney Ace, MD  Relative Name and Phone Number:  Orland Mustard [phone]    Current Level of Care: Hospital Recommended Level of Care: [place] Prior Approval Number:    Date Approved/Denied:   PASRR Number: [id number]  Discharge Plan: SNF    Current Diagnoses: Patient Active Problem List   Diagnosis Date Noted   Anemia of chronic disease    Hyponatremia    Gangrene ([place])    PVD (peripheral vascular disease) ([place])    Aspiration into airway    Hearing loss of right ear    Diabetic foot ulcer ([place]) 10/15/2020   Decreased pedal pulses 10/15/2020   Chronic respiratory failure with hypoxia ([place])    Extreme obesity with alveolar hypoventilation ([place])    Palliative care by specialist    Goals of care, counseling/discussion    End-stage renal disease on hemodialysis ([place])    DNR (do not resuscitate) discussion    Adjustment disorder with mixed anxiety and depressed mood 03/31/2017   Dysthymia 03/31/2017   Chronic pain syndrome 03/31/2017   Palliative care encounter    Obesity hypoventilation syndrome ([place]) 03/09/2017   Chronic diastolic heart failure ([place]) 01/07/2017   Depression 09/24/2016   Acute on chronic diastolic heart failure ([place]) 07/04/2016   COPD with chronic bronchitis ([place]) 07/04/2016   CKD (chronic kidney disease), stage III ([place]) 06/26/2016   COPD exacerbation ([place]) 03/25/2016   Anxiety 10/02/2015   Asthma 08/30/2015   Type 2 diabetes mellitus with ESRD (end-stage renal disease) ([place]) 03/13/2015   Iron deficiency anemia 02/17/2015    Obesity, Class III, BMI 40-49.9 (morbid obesity) ([place])    Shortness of breath    Essential hypertension    Hypertensive heart disease with congestive heart failure ([place]) 12/18/2014   Diabetes ([place]) 12/15/2014    Orientation RESPIRATION BLADDER Height & Weight     Self, Time, Situation, Place  Normal Continent, External catheter Weight: 126.1 kg Height:  5' 4.5" (163.8 cm)  BEHAVIORAL SYMPTOMS/MOOD NEUROLOGICAL BOWEL NUTRITION STATUS      Continent Diet (carb modified)  AMBULATORY STATUS COMMUNICATION OF NEEDS Skin   Extensive Assist Verbally Other (Comment) (wound lower exremity)                       Personal Care Assistance Level of Assistance  Bathing, Feeding, Dressing Bathing Assistance: Limited assistance Feeding assistance: Independent Dressing Assistance: Limited assistance     Functional Limitations Info             SPECIAL CARE FACTORS FREQUENCY  PT (By licensed PT) (IV ABX)     PT Frequency: 5 times per week              Contractures Contractures Info: Not present    Additional Factors Info  Code Status, Allergies, Insulin Sliding Scale Code Status Info: fullcode Allergies Info: Dust Mite Extract, Mold Extract Pollen Extract, Sulfa Antibiotics, Feraheme   Insulin Sliding Scale Info: CBG 70 - 120: 0 units   CBG 121 - 150: 0 units   CBG 151 - 200: 0 units   CBG 201 -  250: 2 units   CBG 251 - 300: 3 units   CBG 301 - 350: 4 units   CBG 351 - 400: 5 units       Current Medications (11/01/2020):  This is the current hospital active medication list Current Facility-Administered Medications  Medication Dose Route Frequency Provider Last Rate Last Admin   0.9 %  sodium chloride infusion  250 mL Intravenous PRN [name], DPM 0 mL/hr at 10/22/20 1200 New Bag at 10/26/20 1654   acetaminophen (TYLENOL) tablet 1,000 mg  1,000 mg Oral Q6H PRN [name], DPM   1,000 mg at 10/31/20 1656   albuterol (PROVENTIL) (2.5 MG/3ML) 0.083% nebulizer solution  2.5 mg  2.5 mg Nebulization Q6H PRN [name], DPM   2.5 mg at 10/31/20 1004   albuterol (VENTOLIN HFA) 108 (90 Base) MCG/ACT inhaler 2 puff  2 puff Inhalation Q4H PRN [name], DPM   2 puff at 10/22/20 0093   ALPRAZolam (XANAX) tablet 0.25 mg  0.25 mg Oral BID PRN [name], DPM   0.25 mg at 10/31/20 1326   alum & mag hydroxide-simeth (MAALOX/MYLANTA) 200-200-20 MG/5ML suspension 30 mL  30 mL Oral Q4H PRN [name], DPM   30 mL at 10/16/20 1622   ascorbic acid (VITAMIN C) tablet 500 mg  500 mg Oral Daily [name], DPM   500 mg at 11/01/20 1039   aspirin EC tablet 81 mg  81 mg Oral Daily [name], DPM   81 mg at 11/01/20 1040   calcium carbonate (OS-CAL - dosed in mg of elemental calcium) tablet 500 mg of elemental calcium  1 tablet Oral TID with meals Loletha Grayer, MD   500 mg of elemental calcium at 11/01/20 1236   calcium carbonate (TUMS - dosed in mg elemental calcium) chewable tablet 200 mg of elemental calcium  1 tablet Oral TID PRN [name], DPM   200 mg of elemental calcium at 10/16/20 1622   cefTAZidime (FORTAZ) 1 g in sodium chloride 0.9 % 100 mL IVPB  1 g Intravenous Q T,Th,Sa-HD Hallaji, Sheema M, RPH 200 mL/hr at 10/31/20 1115 1 g at 10/31/20 1115   Chlorhexidine Gluconate Cloth 2 % PADS 6 each  6 each Topical Daily [name], DPM   6 each at 11/01/20 1132   cholecalciferol (VITAMIN D) tablet 4,000 Units  4,000 Units Oral Weekly [name], DPM   4,000 Units at 10/29/20 2223   cinacalcet (SENSIPAR) tablet 60 mg  60 mg Oral Daily [name], DPM   60 mg at 11/01/20 1040   ciprofloxacin-dexamethasone (CIPRODEX) 0.3-0.1 % OTIC (EAR) suspension 4 drop  4 drop Both EARS BID Wieting, Richard, MD   4 drop at 11/01/20 1040   dextromethorphan-guaiFENesin (MUCINEX DM) 30-600 MG per 12 hr tablet 1 tablet  1 tablet Oral BID PRN [name], DPM   1 tablet at 10/16/20 1049   diphenhydrAMINE (BENADRYL) capsule 25 mg  25 mg Oral Q6H PRN [name], DPM   25 mg at 10/31/20 2122   docusate sodium (COLACE) capsule 100 mg  100 mg Oral Daily [name], DPM   100 mg at 11/01/20 1039   [START ON 11/02/2020] epoetin alfa (EPOGEN) injection 10,000 Units  10,000 Units Intravenous Q T,Th,Sa-HD Colon Flattery, NP       feeding supplement (NEPRO CARB STEADY) liquid 237 mL  237 mL Oral BID BM [name],  DPM   237 mL at 11/01/20 1040   ferric citrate (AURYXIA) tablet 210 mg  210 mg Oral TID WC [name], DPM   210 mg at 11/01/20 1236   heparin injection 5,000 Units  5,000 Units Subcutaneous Q8H [name], DPM   5,000 Units at 11/01/20 0446   hydrALAZINE (APRESOLINE) injection 10 mg  10 mg Intravenous Q4H PRN [name], DPM       insulin aspart (novoLOG) injection 0-5 Units  0-5 Units Subcutaneous QHS [name], DPM   3 Units at 10/31/20 2303   insulin aspart (novoLOG) injection 0-9 Units  0-9 Units Subcutaneous TID WC [name], DPM   2 Units at 11/01/20 1236   insulin aspart (novoLOG) injection 2 Units  2 Units Subcutaneous TID WC [name], DPM   2 Units at 11/01/20 1235   insulin glargine-yfgn (SEMGLEE) injection 40 Units  40 Units Subcutaneous QHS Loletha Grayer, MD   40 Units at 10/31/20 2121   ipratropium-albuterol (DUONEB) 0.5-2.5 (3) MG/3ML nebulizer solution 3 mL  3 mL Nebulization TID [name], DPM   3 mL at 11/01/20 0744   lactulose (CHRONULAC) 10 GM/15ML solution 20 g  20 g Oral Daily PRN [name], DPM   20 g at 10/18/20 1344   linaclotide (LINZESS) capsule 290 mcg  290 mcg Oral q morning [name], DPM   290 mcg at 11/01/20 1040   loratadine (CLARITIN) tablet 10 mg  10 mg Oral Daily [name], DPM   10 mg at 11/01/20 1039   metoprolol tartrate (LOPRESSOR) injection 5 mg  5 mg Intravenous Q4H PRN [name], DPM   5 mg at 10/17/20 2349   metroNIDAZOLE (FLAGYL) tablet 500 mg  500 mg Oral Q12H Tsosie Billing, MD   500 mg at 11/01/20 1040   mometasone-formoterol  (DULERA) 200-5 MCG/ACT inhaler 2 puff  2 puff Inhalation BID [name], DPM   2 puff at 11/01/20 0818   multivitamin (RENA-VIT) tablet 1 tablet  1 tablet Oral QHS [name], DPM   1 tablet at 10/31/20 2122   ondansetron (ZOFRAN) injection 4 mg  4 mg Intravenous Q6H PRN [name], DPM       ondansetron (ZOFRAN-ODT) disintegrating tablet 4 mg  4 mg Oral Q8H PRN [name], DPM       oxyCODONE (Oxy IR/ROXICODONE) immediate release tablet 10 mg  10 mg Oral Q4H PRN Loletha Grayer, MD   10 mg at 11/01/20 1040   oxyCODONE (Oxy IR/ROXICODONE) immediate release tablet 5 mg  5 mg Oral Q4H PRN Loletha Grayer, MD       oxymetazoline (AFRIN) 0.05 % nasal spray 4 spray  4 spray Each Nare Q3H PRN Loletha Grayer, MD       pantoprazole (PROTONIX) EC tablet 40 mg  40 mg Oral Daily [name], DPM   40 mg at 11/01/20 1040   phenol (CHLORASEPTIC) mouth spray 1 spray  1 spray Mouth/Throat PRN [name], DPM       polyethylene glycol (MIRALAX / GLYCOLAX) packet 17 g  17 g Oral Daily PRN [name], DPM       sodium chloride flush (NS) 0.9 % injection 3 mL  3 mL Intravenous Q12H [name], DPM   3 mL at 11/01/20 1041   sodium chloride flush (NS) 0.9 % injection 3 mL  3 mL Intravenous PRN [name],  DPM       traZODone (DESYREL) tablet 50 mg  50 mg Oral QHS PRN [name], DPM   50 mg at 10/31/20 2122   [START ON 11/02/2020] vancomycin (VANCOCIN) IVPB 1000 mg/200 mL premix  1,000 mg Intravenous Q T,Th,Sa-HD [place], [facility]         Discharge Medications: Please see discharge summary for a list of discharge medications.  Relevant Imaging Results:  Relevant Lab Results:   Additional Information SSN [id number]  Su Hilt, RN

## 2020-11-01 NOTE — Progress Notes (Signed)
[facility]  ROUNDING NOTE   Subjective:   [name] was admitted to [facility] on 10/15/2020 for Gangrene [facility]) [I96] Diabetic foot ulcer ([place]) [E11.621, S34.196] Decreased pedal pulses [R09.89] Sepsis, due to unspecified organism, unspecified whether acute organ dysfunction present ([place]) [A41.9]  Patient seen laying in bed States pain slightly improved today Worked with PT yesterday, not yet today Patient seen later sitting up in chair Slightly short of breath from recent activity    Objective:  Vital signs in last 24 hours:  Temp:  [97.9 F (36.6 C)-98.5 F (36.9 C)] 97.9 F (36.6 C) (09/07 1148) Pulse Rate:  [92-102] 102 (09/07 1148) Resp:  [15-20] 16 (09/07 1148) BP: (107-129)/(44-80) 107/80 (09/07 1148) SpO2:  [93 %-100 %] 100 % (09/07 1148)  Weight change:  Filed Weights   10/15/20 1905 10/18/20 0928  Weight: 126.1 kg 126.1 kg    Intake/Output: I/O last 3 completed shifts: In: -  Out: 3000 [Other:3000]   Intake/Output this shift:  No intake/output data recorded.  Physical Exam: General: NAD,sitting in chair  Head: Normocephalic, atraumatic. Moist oral mucosal membranes  Eyes: Anicteric  Lungs:  Clear to auscultation, O2 2L  Heart: Regular rate and rhythm  Abdomen:  Soft, nontender  Extremities: trace peripheral edema. Right foot fifth toe amputation on 10/21/20, ace wrapped dressing  Neurologic: Alert, moving all four extremities  Skin: No lesions  Access: Left AVF    Basic Metabolic Panel: Recent Labs  Lab 10/26/20 0439 10/26/20 1619 10/29/20 0523 10/31/20 0408  NA 124* 130* 129* 127*  K 4.4 4.0 3.8 3.8  CL 86* 95* 91* 90*  CO2 21*  --  23 23  GLUCOSE 257* 143* 277* 266*  BUN 67* 36* 43* 48*  CREATININE 8.01* 6.20* 7.04* 7.53*  CALCIUM 6.9*  --  6.9* 6.4*  MG 2.0  --   --   --   PHOS  --   --  5.6*  --      Liver Function Tests: No results for input(s): AST, ALT, ALKPHOS, BILITOT, PROT, ALBUMIN in the last 168  hours.  No results for input(s): LIPASE, AMYLASE in the last 168 hours. No results for input(s): AMMONIA in the last 168 hours.  CBC: Recent Labs  Lab 10/26/20 0439 10/26/20 1619 10/29/20 0523 10/31/20 0408  WBC 25.4*  --  19.8* 15.3*  NEUTROABS  --   --   --  12.6*  HGB 8.4* 9.9* 8.1* 7.5*  HCT 26.0* 29.0* 25.3* 23.4*  MCV 92.5  --  95.5 95.5  PLT 390  --  341 296     Cardiac Enzymes: No results for input(s): CKTOTAL, CKMB, CKMBINDEX, TROPONINI in the last 168 hours.  BNP: Invalid input(s): POCBNP  CBG: Recent Labs  Lab 10/31/20 1311 10/31/20 1618 10/31/20 2235 11/01/20 0753 11/01/20 1150  GLUCAP 174* 243* 259* 221* 195*     Microbiology: Results for orders placed or performed during the hospital encounter of 10/15/20  Blood Culture (routine x 2)     Status: None   Collection Time: 10/15/20  8:03 PM   Specimen: BLOOD  Result Value Ref Range Status   Specimen Description BLOOD BLOOD RIGHT FOREARM  Final   Special Requests   Final    BOTTLES DRAWN AEROBIC AND ANAEROBIC Blood Culture adequate volume   Culture   Final    NO GROWTH 5 DAYS Performed at [facility], [address] [place], [place] [address]    Report Status 10/20/2020 FINAL  Final  Resp Panel by RT-PCR (Flu A&B, Covid) Nasopharyngeal Swab     Status: None   Collection Time: 10/15/20  8:26 PM   Specimen: Nasopharyngeal Swab; Nasopharyngeal(NP) swabs in vial transport medium  Result Value Ref Range Status   SARS Coronavirus 2 by RT PCR NEGATIVE NEGATIVE Final    Comment: (NOTE) SARS-CoV-2 target nucleic acids are NOT DETECTED.  The SARS-CoV-2 RNA is generally detectable in upper respiratory specimens during the acute phase of infection. The lowest concentration of SARS-CoV-2 viral copies this assay can detect is 138 copies/mL. A negative result does not preclude SARS-Cov-2 infection and should not be used as the sole basis for treatment or other patient management decisions. A  negative result may occur with  improper specimen collection/handling, submission of specimen other than nasopharyngeal swab, presence of viral mutation(s) within the areas targeted by this assay, and inadequate number of viral copies(<138 copies/mL). A negative result must be combined with clinical observations, patient history, and epidemiological information. The expected result is Negative.  Fact Sheet for Patients:  EntrepreneurPulse.com.au  Fact Sheet for Healthcare Providers:  IncredibleEmployment.be  This test is no t yet approved or cleared by the [place] FDA and  has been authorized for detection and/or diagnosis of SARS-CoV-2 by FDA under an Emergency Use Authorization (EUA). This EUA will remain  in effect (meaning this test can be used) for the duration of the COVID-19 declaration under Section 564(b)(1) of the Act, 21 U.S.C.section 360bbb-3(b)(1), unless the authorization is terminated  or revoked sooner.       Influenza A by PCR NEGATIVE NEGATIVE Final   Influenza B by PCR NEGATIVE NEGATIVE Final    Comment: (NOTE) The Xpert Xpress SARS-CoV-2/FLU/RSV plus assay is intended as an aid in the diagnosis of influenza from Nasopharyngeal swab specimens and should not be used as a sole basis for treatment. Nasal washings and aspirates are unacceptable for Xpert Xpress SARS-CoV-2/FLU/RSV testing.  Fact Sheet for Patients: EntrepreneurPulse.com.au  Fact Sheet for Healthcare Providers: IncredibleEmployment.be  This test is not yet approved or cleared by the [place] FDA and has been authorized for detection and/or diagnosis of SARS-CoV-2 by FDA under an Emergency Use Authorization (EUA). This EUA will remain in effect (meaning this test can be used) for the duration of the COVID-19 declaration under Section 564(b)(1) of the Act, 21 U.S.C. section 360bbb-3(b)(1), unless the authorization  is terminated or revoked.  Performed at [facility], [address] [place], [place] [address]   Blood Culture (routine x 2)     Status: None   Collection Time: 10/15/20  8:26 PM   Specimen: BLOOD  Result Value Ref Range Status   Specimen Description BLOOD RIGHT ANTECUBITAL  Final   Special Requests   Final    BOTTLES DRAWN AEROBIC AND ANAEROBIC Blood Culture adequate volume   Culture   Final    NO GROWTH 5 DAYS Performed at [facility], [address] [place], [place] [address]    Report Status 10/20/2020 FINAL  Final  Aerobic Culture w Gram Stain (superficial specimen)     Status: None   Collection Time: 10/17/20  8:52 PM   Specimen: Wound  Result Value Ref Range Status   Specimen Description WOUND  Final   Special Requests NONE  Final   Gram Stain   Final    RARE SQUAMOUS EPITHELIAL CELLS PRESENT FEW WBC PRESENT, PREDOMINANTLY MONONUCLEAR ABUNDANT GRAM VARIABLE ROD FEW GRAM POSITIVE COCCI    Culture   Final  MODERATE ENTEROCOCCUS FAECALIS MODERATE KLEBSIELLA OXYTOCA ABUNDANT DIPHTHEROIDS(CORYNEBACTERIUM SPECIES) Standardized susceptibility testing for this organism is not available.    Report Status 10/20/2020 FINAL  Final   Organism ID, Bacteria ENTEROCOCCUS FAECALIS  Final   Organism ID, Bacteria KLEBSIELLA OXYTOCA  Final      Susceptibility   Enterococcus faecalis - MIC*    AMPICILLIN <=2 SENSITIVE Sensitive     VANCOMYCIN 1 SENSITIVE Sensitive     GENTAMICIN SYNERGY RESISTANT Resistant     * MODERATE ENTEROCOCCUS FAECALIS   Klebsiella oxytoca - MIC*    AMPICILLIN >=32 RESISTANT Resistant     CEFAZOLIN <=4 SENSITIVE Sensitive     CEFEPIME <=0.12 SENSITIVE Sensitive     CEFTAZIDIME <=1 SENSITIVE Sensitive     CEFTRIAXONE <=0.25 SENSITIVE Sensitive     CIPROFLOXACIN <=0.25 SENSITIVE Sensitive     GENTAMICIN <=1 SENSITIVE Sensitive     IMIPENEM <=0.25 SENSITIVE Sensitive     TRIMETH/SULFA <=20 SENSITIVE Sensitive      AMPICILLIN/SULBACTAM 8 SENSITIVE Sensitive     PIP/TAZO <=4 SENSITIVE Sensitive     * MODERATE KLEBSIELLA OXYTOCA  Aerobic/Anaerobic Culture w Gram Stain (surgical/deep wound)     Status: None   Collection Time: 10/21/20  3:07 PM   Specimen: PATH Digit amputation; Tissue  Result Value Ref Range Status   Specimen Description   Final    FOOT  RIGHT 5TH DIGIT AMPUTATION Performed at [facility], [address] [place], [place] [address]    Special Requests   Final    NONE Performed at [facility], [place], [place] [address]    Gram Stain   Final    FEW SQUAMOUS EPITHELIAL CELLS PRESENT FEW WBC SEEN FEW GRAM POSITIVE COCCI    Culture   Final    FEW STREPTOCOCCUS ANGINOSIS NO ANAEROBES ISOLATED Performed at [facility], [place] [address], [place], [place] [address]    Report Status 10/27/2020 FINAL  Final   Organism ID, Bacteria STREPTOCOCCUS ANGINOSIS  Final      Susceptibility   Streptococcus anginosis - MIC*    PENICILLIN 0.12 SENSITIVE Sensitive     CEFTRIAXONE 0.5 SENSITIVE Sensitive     ERYTHROMYCIN 4 RESISTANT Resistant     LEVOFLOXACIN 0.5 SENSITIVE Sensitive     VANCOMYCIN 0.5 SENSITIVE Sensitive     * FEW STREPTOCOCCUS ANGINOSIS  Aerobic/Anaerobic Culture w Gram Stain (surgical/deep wound)     Status: None   Collection Time: 10/26/20  5:35 PM   Specimen: PATH Other; Tissue  Result Value Ref Range Status   Specimen Description   Final    FOOT RIGHT Performed at [facility], [address], [place], [place] [address]    Special Requests   Final    NONE Performed at [facility], [address] [place], [place] [address]    Gram Stain NO WBC SEEN NO ORGANISMS SEEN   Final   Culture   Final    RARE STREPTOCOCCUS ANGINOSIS NO ANAEROBES ISOLATED Performed at [facility], [place] [address] [place],  [address]    Report Status 10/31/2020 FINAL  Final   Organism ID, Bacteria STREPTOCOCCUS  ANGINOSIS  Final      Susceptibility   Streptococcus anginosis - MIC*    PENICILLIN 0.12 SENSITIVE Sensitive     CEFTRIAXONE 0.5 SENSITIVE Sensitive     ERYTHROMYCIN >=8 RESISTANT Resistant     LEVOFLOXACIN 0.5 SENSITIVE Sensitive     VANCOMYCIN 0.5 SENSITIVE Sensitive     *  RARE STREPTOCOCCUS ANGINOSIS    Coagulation Studies: No results for input(s): LABPROT, INR in the last 72 hours.   Urinalysis: No results for input(s): COLORURINE, LABSPEC, PHURINE, GLUCOSEU, HGBUR, BILIRUBINUR, KETONESUR, PROTEINUR, UROBILINOGEN, NITRITE, LEUKOCYTESUR in the last 72 hours.  Invalid input(s): APPERANCEUR    Imaging: No results found.   Medications:    sodium chloride 0 mL/hr at 10/22/20 1200   cefTAZidime (FORTAZ)  IV 1 g (10/31/20 1115)   [START ON 11/02/2020] vancomycin      vitamin C  500 mg Oral Daily   aspirin EC  81 mg Oral Daily   calcium carbonate  1 tablet Oral TID with meals   Chlorhexidine Gluconate Cloth  6 each Topical Daily   cholecalciferol  4,000 Units Oral Weekly   cinacalcet  60 mg Oral Daily   ciprofloxacin-dexamethasone  4 drop Both EARS BID   docusate sodium  100 mg Oral Daily   [START ON 11/02/2020] epoetin (EPOGEN/PROCRIT) injection  10,000 Units Intravenous Q T,Th,Sa-HD   feeding supplement (NEPRO CARB STEADY)  237 mL Oral BID BM   ferric citrate  210 mg Oral TID WC   heparin  5,000 Units Subcutaneous Q8H   insulin aspart  0-5 Units Subcutaneous QHS   insulin aspart  0-9 Units Subcutaneous TID WC   insulin aspart  2 Units Subcutaneous TID WC   insulin glargine-yfgn  40 Units Subcutaneous QHS   ipratropium-albuterol  3 mL Nebulization TID   linaclotide  290 mcg Oral q morning   loratadine  10 mg Oral Daily   metroNIDAZOLE  500 mg Oral Q12H   mometasone-formoterol  2 puff Inhalation BID   multivitamin  1 tablet Oral QHS   pantoprazole  40 mg Oral Daily   sodium chloride flush  3 mL Intravenous Q12H   sodium chloride, acetaminophen, albuterol, albuterol,  ALPRAZolam, alum & mag hydroxide-simeth, calcium carbonate, dextromethorphan-guaiFENesin, diphenhydrAMINE, hydrALAZINE, lactulose, metoprolol tartrate, ondansetron (ZOFRAN) IV, ondansetron, oxyCODONE, oxyCODONE, oxymetazoline, phenol, polyethylene glycol, sodium chloride flush, traZODone  Assessment/ Plan:  [name] is a 50 y.o. black female with end stage renal disease on hemodialysis, diabetes mellitus type II, hypertension, congestive heart failure, COPD, who is admitted to [facility] on 10[facility]15/2020 for Gangrene [facility]) [I96] Diabetic foot ulcer ([place]) [Q67.619, L97.509] Decreased pedal pulses [R09.89] Sepsis, due to unspecified organism, unspecified whether acute organ dysfunction present ([place]) [A41.9]  [facility] Left AVF 127.5kg  End Stage Renal Disease: Received dialysis yesterday, UF goal 3L achieved. Tolerated well. Next treatment scheduled for Thursday.  Hypotension: BP 107/80  Anemia with chronic kidney disease: Hgb 7.5, EPO ordered during treatment  Secondary Hyperparathyroidism with hyeprphosphatemia: there is risk for calciphylaxis. Outpatient labs on 8/9: phos 10.6, calcium 8.2, PTH 1456.  -Continue Cinacalcet and Renvela. -Calcium 6.4, calcium carbonate 1 tab TID ordered  Diabetes mellitus type II with chronic kidney disease: insulin dependent. Complicated with diabetic foot ulcer. History of poor control. Hemoglobin A1c 9.2% on 09/19/2020.  Vascular performed right lower angiogram. 30% mild lesion found popliteal artery. Deemed adequate for wound healing.  Podiatry performed right foot, partial fifth toe amputation on 10/21/20. Dressing changes determine need for further amputation. Patient requesting second opinion.  - Currently receiving Metronidazole and Fortaz. Tressie Ellis given with dialysis.  - Appreciate podiatry and vascular input.  - Glucose elevated, SSI managed by primary team   6.    Hyponatremia: Na 127 today, will improve with dialysis. Encourage patient  to limit amount of ice chips.  LOS: [place] 9/7/202212:49 PM

## 2020-11-01 NOTE — Progress Notes (Signed)
Physical Therapy Treatment Patient Details Name: [name] MRN: [id number] DOB: 07-25-1970 Today's Date: 11/01/2020    History of Present Illness [name]  is a 50 y.o. female presenting to hospital 8/21 with chest pain, fever, fatigue, and generalized malaise; also foul smell from R great toe (chronic ulcer).  Pt admitted with diabetic foot ulcers with gangrene of the 5th toe and osteomyelitis. S/p R LE angio 8/24; s/p partial R foot 5th toe amp 8/27; and s/p I&D with revisional debridement R foot 9/1.  Per PT order 9/2: pt Rulo R foot. PMH includes DM, ESRD, anxiety, CHF, CKD, PNA, chronic respiratory failure with hypoxia and hypercapnia.    PT Comments    Author returning to room to assist with transfer back to bed from recliner.Pt only able to do so using lateral scoot technique with drop arm against FOB (foot board removed). NA/patient educated extensively as they were perfomring STS with PWB using a RW prior to entry. Pt exhausted from mobility exertion required for toiletting and has lost motivation to perform scoot back top bed, but author emphasized this being the only way she has demonstrated ability to perform NWB, otherwise a hoyer lift is the only other option. Pt on 4L/min already for exertion. Able to lateral scoot into bed with 2 rest breaks given fatigue and exertion. Once fully out of chair, author allowed pt/NA to finish transition back to supine and pericare needs. Pt making excellent progress this date, flelt pleased and physically comfortable to be able to get OOB to chair, but is exhausted from requisite exertion. Pt offered encouraging words and hope for improved facility in coming days.   Follow Up Recommendations  SNF;Supervision for mobility/OOB     Equipment Recommendations  None recommended by PT    Recommendations for Other Services OT consult     Precautions / Restrictions Precautions Precautions: Fall Restrictions RLE Weight Bearing: Non weight  bearing Other Position/Activity Restrictions: [place] R foot    Mobility  Bed Mobility                    Transfers Overall transfer level: Needs assistance Equipment used:  (Pt performed a STS from recliner c RW s/p author entry, for toiletting with NA. Pt admits to not remaining NWB; educated again on importance of NWB status) Transfers: Lateral/Scoot Transfers Sit to Stand: Supervision Stand pivot transfers:  (pt requests with intent in [place] on RLE; author refuses due to pt inabiity to perfomr and maintain NWB as directed)       General transfer comment: extesnive cues for foot placement and contrlateral trunk lean; pt needs no cuing to maintain NWB, albeit she allows foot to rest on floor while seated.  Ambulation/Gait                 Stairs             Wheelchair Mobility    Modified Rankin (Stroke Patients Only)       Balance Overall balance assessment: Modified Independent;No apparent balance deficits (not formally assessed)                                          Cognition Arousal/Alertness: Awake/alert Behavior During Therapy: WFL for tasks assessed/performed Overall Cognitive Status: Within Functional Limits for tasks assessed  Exercises      General Comments        Pertinent Vitals/Pain      Home Living                      Prior Function            PT Goals (current goals can now be found in the care plan section) Acute Rehab PT Goals Patient Stated Goal: to improve mobility PT Goal Formulation: With patient Time For Goal Achievement: 11/10/20 Potential to Achieve Goals: Fair Progress towards PT goals: Progressing toward goals    Frequency    Min 2X/week (unable to meet QD treatments with success due to HD schedule)      PT Plan Current plan remains appropriate    Co-evaluation              AM-PAC PT "6 Clicks" Mobility    Outcome Measure  Help needed turning from your back to your side while in a flat bed without using bedrails?: A Little Help needed moving from lying on your back to sitting on the side of a flat bed without using bedrails?: A Little Help needed moving to and from a bed to a chair (including a wheelchair)?: A Little Help needed standing up from a chair using your arms (e.g., wheelchair or bedside chair)?: Total Help needed to walk in hospital room?: Total Help needed climbing 3-5 steps with a railing? : Total 6 Click Score: 12    End of Session Equipment Utilized During Treatment: Oxygen Activity Tolerance: Patient tolerated treatment well;Patient limited by fatigue Patient left: in bed;with nursing/sitter in room;with call bell/phone within reach Nurse Communication: Mobility status;Precautions PT Visit Diagnosis: Other abnormalities of gait and mobility (R26.89);Muscle weakness (generalized) (M62.81);Pain Pain - Right/Left: Right Pain - part of body: Ankle and joints of foot     Time: 5465-6812 PT Time Calculation (min) (ACUTE ONLY): 15 min  Charges:  $Therapeutic Activity: 8-22 mins                    4:31 PM, 11/01/20 Etta Grandchild, PT, DPT Physical Therapist - [facility]  [phone] ([place])      [place] C 11/01/2020, 4:27 PM

## 2020-11-01 NOTE — Treatment Plan (Signed)
Diagnosis: Rt foot  necrotic infection with DM ESRD   Allergies  Allergen Reactions   Dust Mite Extract Shortness Of Breath and Itching   Mold Extract [Trichophyton] Shortness Of Breath and Itching   Pollen Extract Shortness Of Breath    Itching, shortness of breath, asthma like symptoms   Sulfa Antibiotics Itching   Feraheme [Ferumoxytol] Itching    Uses benadryl so she can get the medicine    OPAT Orders Discharge antibiotics: During dialysis Vanco 1 gram on the day of dialysis Ceftazidime 1gram -1 gram -2 gram  on days of dialysis Aim for pre hemodialysis Vancomycin level  15-25 (unless otherwise indicated) Duration:total of 4 weeks from last debridement  End Date: 11/22/20   Labs weekly while on IV antibiotics: _X CBC with differential  _X CMP    Fax weekly labs to  Dr>Lorilyn [phone]  Clinic Follow Up Appt: 11/21/20 at 12.15 pm   Call [phone] with any questions

## 2020-11-01 NOTE — Progress Notes (Signed)
PROGRESS NOTE    [name]  ZWC:[id number] DOB: 09-Jan-1971 DOA: 10/15/2020 PCP: [name], NP    Brief Narrative:  Patient admitted 15 days ago with diabetic foot ulcers with gangrene of the fifth toe and osteomyelitis.  Past medical history chronic diastolic congestive heart failure, chronic hypoxic respiratory failure on 3 to 4 L of oxygen, end-stage renal disease with diabetes, morbid obesity.  On 10/21/2020 had amputation of the fifth ray and I&D of abscess on the right foot.  On 10/26/2020 had washout procedure.  The patient initially was on IV Zosyn.  Switched over to ceftazidime with dosing with dialysis at this point.  Specialist had recommended amputation but patient is not ready to make that decision at this point in time.   Assessment & Plan:   Principal Problem:   Diabetic foot ulcer ([place]) Active Problems:   Diabetes ([place])   Obesity, Class III, BMI 40-49.9 (morbid obesity) ([place])   Essential hypertension   Type 2 diabetes mellitus with ESRD (end-stage renal disease) ([place])   DNR (do not resuscitate) discussion   End-stage renal disease on hemodialysis ([place])   Chronic respiratory failure with hypoxia (HCC)   Decreased pedal pulses   Gangrene (HCC)   PVD (peripheral vascular disease) (H[facility])   Aspiration into airway   Hearing loss of right ear   Hyponatremia   Anemia of chronic disease  Diabetic fold associate with gangrene of the fifth toe associated with osteomyelitis Status post amputation of fifth ray and I&D on 8/27 Washout procedure on 10/26/2020 ID and podiatry following Reaffirmed with patient, she is declining amputation at this time Encouraged her not to delay her decision and seek out surgical evaluation when she is ready to make the decision Plan: IV vancomycin and ceftazidime with dialysis Will need 4 weeks of therapy Appreciate ID follow-up Can likely discharge to skilled nursing facility within the next 24 to 48 hours  ESRD on HD Nephrology  consulted TTS dialysis schedule Follow nephrology recommendation for inpatient HD needs  Diabetes mellitus type 2, uncontrolled Semglee 40 units nightly NovoLog and sliding scale Carb modified diet  Anemia of chronic kidney disease Stable, will monitor  Peripheral vascular disease Continue aspirin  Hyponatremia Managed with hemodialysis  Chronic hypoxic respiratory failure At baseline, 3 to 4 L  Morbid obesity This complicates overall care and prognosis  Weakness Functional decline Will need skilled nursing facility at time of discharge      DVT prophylaxis: SQ Lovenox Code Status: Full Family Communication: None today Disposition Plan: Status is: Inpatient  Remains inpatient appropriate because:Unsafe d/c plan and Inpatient level of care appropriate due to severity of illness  Dispo: The patient is from: Home              Anticipated d/c is to: SNF              Patient currently is not medically stable to d/c.   Difficult to place patient No       Level of care: Med-Surg  Consultants:  ID Podiatry Nephrology  Procedures:  8/24 angiogram right lower extremity 8/27 amputation of the fifth ray and I&D of abscess of the right foot 9/1 washout procedure  Antimicrobials:  Vancomycin Ceftazidime   Subjective: Patient seen and examined.  Resting comfortably in bed.  Again discussed her decision regarding recommended amputation.  She reaffirms she is not ready at this time.  Objective: Vitals:   11/01/20 0444 11/01/20 0747 11/01/20 0805 11/01/20 1148  BP: [name])  123/51  129/65 107/80  Pulse: 92  94 (!) 102  Resp: 20  15 16   Temp: 97.9 F (36.6 C)  98.2 F (36.8 C) 97.9 F (36.6 C)  TempSrc:      SpO2: 100% 99% 94% 100%  Weight:      Height:        Intake/Output Summary (Last 24 hours) at 11/01/2020 1341 Last data filed at 11/01/2020 1019 Gross per 24 hour  Intake 0 ml  Output --  Net 0 ml   Filed Weights   10/15/20 1905 10/18/20 0928   Weight: 126.1 kg 126.1 kg    Examination:  General exam: Appears calm and comfortable  Respiratory system: Clear to auscultation. Respiratory effort normal. Cardiovascular system: S1-S2, RRR, no murmurs, no pedal edema Gastrointestinal system: Obese, nontender, nondistended, normal bowel sounds Central nervous system: Alert and oriented. No focal neurological deficits. Extremities: Decreased power bilateral lower extremities Skin: Lower leg with skin darkening, significant nonpitting edema.  Right foot with significant wounds Psychiatry: Judgement and insight appear normal. Mood & affect appropriate.     Data Reviewed: I have personally reviewed following labs and imaging studies  CBC: Recent Labs  Lab 10/26/20 0439 10/26/20 1619 10/29/20 0523 10/31/20 0408  WBC 25.4*  --  19.8* 15.3*  NEUTROABS  --   --   --  12.6*  HGB 8.4* 9.9* 8.1* 7.5*  HCT 26.0* 29.0* 25.3* 23.4*  MCV 92.5  --  95.5 95.5  PLT 390  --  341 160   Basic Metabolic Panel: Recent Labs  Lab 10/26/20 0439 10/26/20 1619 10/29/20 0523 10/31/20 0408  NA 124* 130* 129* 127*  K 4.4 4.0 3.8 3.8  CL 86* 95* 91* 90*  CO2 21*  --  23 23  GLUCOSE 257* 143* 277* 266*  BUN 67* 36* 43* 48*  CREATININE 8.01* 6.20* 7.04* 7.53*  CALCIUM 6.9*  --  6.9* 6.4*  MG 2.0  --   --   --   PHOS  --   --  5.6*  --    GFR: Estimated Creatinine Clearance: 12 mL/min (A) (by C-G formula based on SCr of 7.53 mg/dL (H)). Liver Function Tests: No results for input(s): AST, ALT, ALKPHOS, BILITOT, PROT, ALBUMIN in the last 168 hours. No results for input(s): LIPASE, AMYLASE in the last 168 hours. No results for input(s): AMMONIA in the last 168 hours. Coagulation Profile: Recent Labs  Lab 10/26/20 0439  INR 1.3*   Cardiac Enzymes: No results for input(s): CKTOTAL, CKMB, CKMBINDEX, TROPONINI in the last 168 hours. BNP (last 3 results) No results for input(s): PROBNP in the last 8760 hours. HbA1C: No results for  input(s): HGBA1C in the last 72 hours. CBG: Recent Labs  Lab 10/31/20 1311 10/31/20 1618 10/31/20 2235 11/01/20 0753 11/01/20 1150  GLUCAP 174* 243* 259* 221* 195*   Lipid Profile: No results for input(s): CHOL, HDL, LDLCALC, TRIG, CHOLHDL, LDLDIRECT in the last 72 hours. Thyroid Function Tests: No results for input(s): TSH, T4TOTAL, FREET4, T3FREE, THYROIDAB in the last 72 hours. Anemia Panel: No results for input(s): VITAMINB12, FOLATE, FERRITIN, TIBC, IRON, RETICCTPCT in the last 72 hours. Sepsis Labs: No results for input(s): PROCALCITON, LATICACIDVEN in the last 168 hours.  Recent Results (from the past 240 hour(s))  Aerobic/Anaerobic Culture w Gram Stain (surgical/deep wound)     Status: None   Collection Time: 10/26/20  5:35 PM   Specimen: PATH Other; Tissue  Result Value Ref Range Status   Specimen Description  Final    FOOT RIGHT Performed at [facility], [address] [place], [place] [address]    Special Requests   Final    NONE Performed at [facility], [place], [place] [address]    Gram Stain NO WBC SEEN NO ORGANISMS SEEN   Final   Culture   Final    RARE STREPTOCOCCUS ANGINOSIS NO ANAEROBES ISOLATED Performed at [facility], [place] [address] [place], [place] [address]    Report Status 10/31/2020 FINAL  Final   Organism ID, Bacteria STREPTOCOCCUS ANGINOSIS  Final      Susceptibility   Streptococcus anginosis - MIC*    PENICILLIN 0.12 SENSITIVE Sensitive     CEFTRIAXONE 0.5 SENSITIVE Sensitive     ERYTHROMYCIN >=8 RESISTANT Resistant     LEVOFLOXACIN 0.5 SENSITIVE Sensitive     VANCOMYCIN 0.5 SENSITIVE Sensitive     * RARE STREPTOCOCCUS ANGINOSIS         Radiology Studies: No results found.      Scheduled Meds:  vitamin C  500 mg Oral Daily   aspirin EC  81 mg Oral Daily   calcium carbonate  1 tablet Oral TID with meals   Chlorhexidine Gluconate Cloth  6 each Topical Daily   cholecalciferol   4,000 Units Oral Weekly   cinacalcet  60 mg Oral Daily   ciprofloxacin-dexamethasone  4 drop Both EARS BID   docusate sodium  100 mg Oral Daily   [START ON 11/02/2020] epoetin (EPOGEN/PROCRIT) injection  10,000 Units Intravenous Q T,Th,Sa-HD   feeding supplement (NEPRO CARB STEADY)  237 mL Oral BID BM   ferric citrate  210 mg Oral TID WC   heparin  5,000 Units Subcutaneous Q8H   insulin aspart  0-5 Units Subcutaneous QHS   insulin aspart  0-9 Units Subcutaneous TID WC   insulin aspart  2 Units Subcutaneous TID WC   insulin glargine-yfgn  40 Units Subcutaneous QHS   ipratropium-albuterol  3 mL Nebulization TID   linaclotide  290 mcg Oral q morning   loratadine  10 mg Oral Daily   metroNIDAZOLE  500 mg Oral Q12H   mometasone-formoterol  2 puff Inhalation BID   multivitamin  1 tablet Oral QHS   pantoprazole  40 mg Oral Daily   sodium chloride flush  3 mL Intravenous Q12H   Continuous Infusions:  sodium chloride 0 mL/hr at 10/22/20 1200   cefTAZidime (FORTAZ)  IV 1 g (10/31/20 1115)   [START ON 11/02/2020] vancomycin       LOS: 17 days    Time spent: 35 minutes    Sidney Ace, MD [facility] Pager [phone]  If 7PM-7AM, please contact night-coverage  11/01/2020, 1:41 PM

## 2020-11-01 NOTE — TOC Progression Note (Signed)
Transition of Care [facility] - Progression Note    Patient Details  Name: [name] MRN: [id number] Date of Birth: [birth year]  Transition of Care [facility]) CM/SW [place], RN Phone Number: 11/01/2020, 2:28 PM  Clinical Narrative:    Met with the patient today at the bedside, she confirms that she is refusing the recommended BKA.  She understands that she will have at least 4 weeks of IV ABX, she is agreeable to me doing a bedsearch to determine which STR facilities has a bed to offer, Passr Obtined, Fl2 completed, bedsearch sent, will review offers once obtained   Expected Discharge Plan: Home/Self Care Barriers to Discharge: Continued Medical Work up  Expected Discharge Plan and Services Expected Discharge Plan: Home/Self Care   Discharge Planning Services: CM Consult   Living arrangements for the past 2 months: Single Family Home                 DME Arranged: [place] rolling DME Agency: [employer] Date DME Agency Contacted: 10/19/20 Time DME Agency Contacted: [phone] Representative spoke with at DME Agency: [place] Determinants of Health ([place]) Interventions    Readmission Risk Interventions No flowsheet data found.

## 2020-11-02 DIAGNOSIS — L97514 Non-pressure chronic ulcer of other part of right foot with necrosis of bone: Secondary | ICD-10-CM | POA: Diagnosis not present

## 2020-11-02 DIAGNOSIS — E11621 Type 2 diabetes mellitus with foot ulcer: Secondary | ICD-10-CM | POA: Diagnosis not present

## 2020-11-02 LAB — GLUCOSE, CAPILLARY
Glucose-Capillary: 230 mg/dL — ABNORMAL HIGH (ref 70–99)
Glucose-Capillary: 142 mg/dL — ABNORMAL HIGH (ref 70–99)
Glucose-Capillary: 213 mg/dL — ABNORMAL HIGH (ref 70–99)
Glucose-Capillary: 284 mg/dL — ABNORMAL HIGH (ref 70–99)

## 2020-11-02 NOTE — Progress Notes (Signed)
[facility]  ROUNDING NOTE   Subjective:   [name] was admitted to [facility] on 10/15/2020 for Gangrene [facility]) [I96] Diabetic foot ulcer ([place]) [E11.621, L97.509] Decreased pedal pulses [R09.89] Sepsis, due to unspecified organism, unspecified whether acute organ dysfunction present [facility]) [A41.9]  Patient evaluated during dialysis   HEMODIALYSIS FLOWSHEET:  Blood Flow Rate (mL/min): 350 mL/min Arterial Pressure (mmHg): -160 mmHg Venous Pressure (mmHg): 190 mmHg Transmembrane Pressure (mmHg): 70 mmHg Ultrafiltration Rate (mL/min): 1330 mL/min Dialysate Flow Rate (mL/min): 500 ml/min Conductivity: Machine : 13.9 Conductivity: Machine : 13.9 Dialysis Fluid Bolus: Normal Saline Bolus Amount (mL): 200 mL  No complaints at this time   Objective:  Vital signs in last 24 hours:  Temp:  [97.7 F (36.5 C)-98.9 F (37.2 C)] 98.4 F (36.9 C) (09/08 1200) Pulse Rate:  [37-99] 91 (09/08 1200) Resp:  [15-20] 17 (09/08 1200) BP: (95-167)/(56-95) 148/93 (09/08 1200) SpO2:  [93 %-100 %] 100 % (09/08 1200)  Weight change:  Filed Weights   10/15/20 1905 10/18/20 0928  Weight: 126.1 kg 126.1 kg    Intake/Output: No intake/output data recorded.   Intake/Output this shift:  Total I/O In: -  Out: 3500 [Other:3500]  Physical Exam: General: NAD,resting in bed  Head: Normocephalic, atraumatic. Moist oral mucosal membranes  Eyes: Anicteric  Lungs:  Clear to auscultation, O2 2L  Heart: Regular rate and rhythm  Abdomen:  Soft, nontender  Extremities: trace peripheral edema. Right foot fifth toe amputation on 10/21/20, ace wrapped dressing  Neurologic: Alert, moving all four extremities  Skin: No lesions  Access: Left AVF    Basic Metabolic Panel: Recent Labs  Lab 10/26/20 1619 10/29/20 0523 10/31/20 0408  NA 130* 129* 127*  K 4.0 3.8 3.8  CL 95* 91* 90*  CO2  --  23 23  GLUCOSE 143* 277* 266*  BUN 36* 43* 48*  CREATININE 6.20* 7.04* 7.53*  CALCIUM   --  6.9* 6.4*  PHOS  --  5.6*  --      Liver Function Tests: No results for input(s): AST, ALT, ALKPHOS, BILITOT, PROT, ALBUMIN in the last 168 hours.  No results for input(s): LIPASE, AMYLASE in the last 168 hours. No results for input(s): AMMONIA in the last 168 hours.  CBC: Recent Labs  Lab 10/26/20 1619 10/29/20 0523 10/31/20 0408  WBC  --  19.8* 15.3*  NEUTROABS  --   --  12.6*  HGB 9.9* 8.1* 7.5*  HCT 29.0* 25.3* 23.4*  MCV  --  95.5 95.5  PLT  --  341 296     Cardiac Enzymes: No results for input(s): CKTOTAL, CKMB, CKMBINDEX, TROPONINI in the last 168 hours.  BNP: Invalid input(s): POCBNP  CBG: Recent Labs  Lab 11/01/20 1150 11/01/20 1454 11/01/20 1630 11/01/20 2050 11/02/20 0739  GLUCAP 195* 260* 258* 104* 61*     Microbiology: Results for orders placed or performed during the hospital encounter of 10/15/20  Blood Culture (routine x 2)     Status: None   Collection Time: 10/15/20  8:03 PM   Specimen: BLOOD  Result Value Ref Range Status   Specimen Description BLOOD BLOOD RIGHT FOREARM  Final   Special Requests   Final    BOTTLES DRAWN AEROBIC AND ANAEROBIC Blood Culture adequate volume   Culture   Final    NO GROWTH 5 DAYS Performed at [facility], [address], [place], [place] [address]    Report Status 10/20/2020 FINAL  Final  Resp Panel  by RT-PCR (Flu A&B, Covid) Nasopharyngeal Swab     Status: None   Collection Time: 10/15/20  8:26 PM   Specimen: Nasopharyngeal Swab; Nasopharyngeal(NP) swabs in vial transport medium  Result Value Ref Range Status   SARS Coronavirus 2 by RT PCR NEGATIVE NEGATIVE Final    Comment: (NOTE) SARS-CoV-2 target nucleic acids are NOT DETECTED.  The SARS-CoV-2 RNA is generally detectable in upper respiratory specimens during the acute phase of infection. The lowest concentration of SARS-CoV-2 viral copies this assay can detect is 138 copies/mL. A negative result does not preclude  SARS-Cov-2 infection and should not be used as the sole basis for treatment or other patient management decisions. A negative result may occur with  improper specimen collection/handling, submission of specimen other than nasopharyngeal swab, presence of viral mutation(s) within the areas targeted by this assay, and inadequate number of viral copies(<138 copies/mL). A negative result must be combined with clinical observations, patient history, and epidemiological information. The expected result is Negative.  Fact Sheet for Patients:  EntrepreneurPulse.com.au  Fact Sheet for Healthcare Providers:  IncredibleEmployment.be  This test is no t yet approved or cleared by the [place] FDA and  has been authorized for detection and/or diagnosis of SARS-CoV-2 by FDA under an Emergency Use Authorization (EUA). This EUA will remain  in effect (meaning this test can be used) for the duration of the COVID-19 declaration under Section 564(b)(1) of the Act, 21 U.S.C.section 360bbb-3(b)(1), unless the authorization is terminated  or revoked sooner.       Influenza A by PCR NEGATIVE NEGATIVE Final   Influenza B by PCR NEGATIVE NEGATIVE Final    Comment: (NOTE) The Xpert Xpress SARS-CoV-2/FLU/RSV plus assay is intended as an aid in the diagnosis of influenza from Nasopharyngeal swab specimens and should not be used as a sole basis for treatment. Nasal washings and aspirates are unacceptable for Xpert Xpress SARS-CoV-2/FLU/RSV testing.  Fact Sheet for Patients: EntrepreneurPulse.com.au  Fact Sheet for Healthcare Providers: IncredibleEmployment.be  This test is not yet approved or cleared by the [place] FDA and has been authorized for detection and/or diagnosis of SARS-CoV-2 by FDA under an Emergency Use Authorization (EUA). This EUA will remain in effect (meaning this test can be used) for the duration of  the COVID-19 declaration under Section 564(b)(1) of the Act, 21 U.S.C. section 360bbb-3(b)(1), unless the authorization is terminated or revoked.  Performed at [facility], [address] [place], [place] [address]   Blood Culture (routine x 2)     Status: None   Collection Time: 10/15/20  8:26 PM   Specimen: BLOOD  Result Value Ref Range Status   Specimen Description BLOOD RIGHT ANTECUBITAL  Final   Special Requests   Final    BOTTLES DRAWN AEROBIC AND ANAEROBIC Blood Culture adequate volume   Culture   Final    NO GROWTH 5 DAYS Performed at [facility], [address], [place], [place] [address]    Report Status 10/20/2020 FINAL  Final  Aerobic Culture w Gram Stain (superficial specimen)     Status: None   Collection Time: 10/17/20  8:52 PM   Specimen: Wound  Result Value Ref Range Status   Specimen Description WOUND  Final   Special Requests NONE  Final   Gram Stain   Final    RARE SQUAMOUS EPITHELIAL CELLS PRESENT FEW WBC PRESENT, PREDOMINANTLY MONONUCLEAR ABUNDANT GRAM VARIABLE ROD FEW GRAM POSITIVE COCCI    Culture   Final    MODERATE  ENTEROCOCCUS FAECALIS MODERATE KLEBSIELLA OXYTOCA ABUNDANT DIPHTHEROIDS(CORYNEBACTERIUM SPECIES) Standardized susceptibility testing for this organism is not available.    Report Status 10/20/2020 FINAL  Final   Organism ID, Bacteria ENTEROCOCCUS FAECALIS  Final   Organism ID, Bacteria KLEBSIELLA OXYTOCA  Final      Susceptibility   Enterococcus faecalis - MIC*    AMPICILLIN <=2 SENSITIVE Sensitive     VANCOMYCIN 1 SENSITIVE Sensitive     GENTAMICIN SYNERGY RESISTANT Resistant     * MODERATE ENTEROCOCCUS FAECALIS   Klebsiella oxytoca - MIC*    AMPICILLIN >=32 RESISTANT Resistant     CEFAZOLIN <=4 SENSITIVE Sensitive     CEFEPIME <=0.12 SENSITIVE Sensitive     CEFTAZIDIME <=1 SENSITIVE Sensitive     CEFTRIAXONE <=0.25 SENSITIVE Sensitive     CIPROFLOXACIN <=0.25 SENSITIVE Sensitive     GENTAMICIN <=1  SENSITIVE Sensitive     IMIPENEM <=0.25 SENSITIVE Sensitive     TRIMETH/SULFA <=20 SENSITIVE Sensitive     AMPICILLIN/SULBACTAM 8 SENSITIVE Sensitive     PIP/TAZO <=4 SENSITIVE Sensitive     * MODERATE KLEBSIELLA OXYTOCA  Aerobic/Anaerobic Culture w Gram Stain (surgical/deep wound)     Status: None   Collection Time: 10/21/20  3:07 PM   Specimen: PATH Digit amputation; Tissue  Result Value Ref Range Status   Specimen Description   Final    FOOT  RIGHT 5TH DIGIT AMPUTATION Performed at [facility], [address], [place], [place] [address]    Special Requests   Final    NONE Performed at [facility], [place], [place] [address]    Gram Stain   Final    FEW SQUAMOUS EPITHELIAL CELLS PRESENT FEW WBC SEEN FEW GRAM POSITIVE COCCI    Culture   Final    FEW STREPTOCOCCUS ANGINOSIS NO ANAEROBES ISOLATED Performed at [facility], [address] [place], [place] [address]    Report Status 10/27/2020 FINAL  Final   Organism ID, Bacteria STREPTOCOCCUS ANGINOSIS  Final      Susceptibility   Streptococcus anginosis - MIC*    PENICILLIN 0.12 SENSITIVE Sensitive     CEFTRIAXONE 0.5 SENSITIVE Sensitive     ERYTHROMYCIN 4 RESISTANT Resistant     LEVOFLOXACIN 0.5 SENSITIVE Sensitive     VANCOMYCIN 0.5 SENSITIVE Sensitive     * FEW STREPTOCOCCUS ANGINOSIS  Aerobic/Anaerobic Culture w Gram Stain (surgical/deep wound)     Status: None   Collection Time: 10/26/20  5:35 PM   Specimen: PATH Other; Tissue  Result Value Ref Range Status   Specimen Description   Final    FOOT RIGHT Performed at [facility], [address], [place], [place] [address]    Special Requests   Final    NONE Performed at [facility], [address] [place], [place] [address]    Gram Stain NO WBC SEEN NO ORGANISMS SEEN   Final   Culture   Final    RARE STREPTOCOCCUS ANGINOSIS NO ANAEROBES ISOLATED Performed at [facility], [place]  [address] [place], [place] [address]    Report Status 10/31/2020 FINAL  Final   Organism ID, Bacteria STREPTOCOCCUS ANGINOSIS  Final      Susceptibility   Streptococcus anginosis - MIC*    PENICILLIN 0.12 SENSITIVE Sensitive     CEFTRIAXONE 0.5 SENSITIVE Sensitive     ERYTHROMYCIN >=8 RESISTANT Resistant     LEVOFLOXACIN 0.5 SENSITIVE Sensitive     VANCOMYCIN 0.5 SENSITIVE Sensitive     *  RARE STREPTOCOCCUS ANGINOSIS    Coagulation Studies: No results for input(s): LABPROT, INR in the last 72 hours.   Urinalysis: No results for input(s): COLORURINE, LABSPEC, PHURINE, GLUCOSEU, HGBUR, BILIRUBINUR, KETONESUR, PROTEINUR, UROBILINOGEN, NITRITE, LEUKOCYTESUR in the last 72 hours.  Invalid input(s): APPERANCEUR    Imaging: No results found.   Medications:    sodium chloride 0 mL/hr at 10/22/20 1200   [START ON 11/07/2020] cefTAZidime (FORTAZ)  IV     cefTAZidime (FORTAZ)  IV     [START ON 11/04/2020] cefTAZidime (FORTAZ)  IV     vancomycin 1,000 mg (11/02/20 1112)    vitamin C  500 mg Oral Daily   aspirin EC  81 mg Oral Daily   calcium carbonate  1 tablet Oral TID with meals   Chlorhexidine Gluconate Cloth  6 each Topical Daily   cholecalciferol  4,000 Units Oral Weekly   cinacalcet  60 mg Oral Daily   ciprofloxacin-dexamethasone  4 drop Both EARS BID   docusate sodium  100 mg Oral Daily   epoetin (EPOGEN/PROCRIT) injection  10,000 Units Intravenous Q T,Th,Sa-HD   feeding supplement (NEPRO CARB STEADY)  237 mL Oral BID BM   ferric citrate  210 mg Oral TID WC   heparin  5,000 Units Subcutaneous Q8H   insulin aspart  0-5 Units Subcutaneous QHS   insulin aspart  0-9 Units Subcutaneous TID WC   insulin aspart  2 Units Subcutaneous TID WC   insulin glargine-yfgn  40 Units Subcutaneous QHS   ipratropium-albuterol  3 mL Nebulization TID   linaclotide  290 mcg Oral q morning   loratadine  10 mg Oral Daily   metroNIDAZOLE  500 mg Oral Q12H   mometasone-formoterol  2 puff Inhalation  BID   multivitamin  1 tablet Oral QHS   pantoprazole  40 mg Oral Daily   sodium chloride flush  3 mL Intravenous Q12H   sodium chloride, acetaminophen, albuterol, albuterol, ALPRAZolam, alum & mag hydroxide-simeth, calcium carbonate, dextromethorphan-guaiFENesin, diphenhydrAMINE, hydrALAZINE, HYDROmorphone (DILAUDID) injection, lactulose, metoprolol tartrate, ondansetron (ZOFRAN) IV, ondansetron, oxyCODONE, oxyCODONE, oxymetazoline, phenol, polyethylene glycol, sodium chloride flush, traZODone  Assessment/ Plan:  Ms. ZOELLE MARKUS is a 50 y.o. black female with end stage renal disease on hemodialysis, diabetes mellitus type II, hypertension, congestive heart failure, COPD, who is admitted to [facility] on 10/15/2020 for Gangrene [facility]) [I96] Diabetic foot ulcer ([place]) [A07.622, L97.509] Decreased pedal pulses [R09.89] Sepsis, due to unspecified organism, unspecified whether acute organ dysfunction present ([place]) [A41.9]  [facility] Left AVF 127.5kg  End Stage Renal Disease: Receiving dialysis today, UF goal 3.5L as tolerated. Next treatment scheduled for Saturday. Antibiotics prescribed with dialysis and will resume outpatient. Ceftazidime 1g/1g/2g with HD treatment with an end date of 11/23/20.  Hypotension: stable BP 148/93  Anemia with chronic kidney disease: Hgb 7.5, EPO with dialysis treatments  Secondary Hyperparathyroidism with hyeprphosphatemia: there is risk for calciphylaxis. Outpatient labs on 8/9: phos 10.6, calcium 8.2, PTH 1456.  -Continue Cinacalcet and Renvela. -Calcium 6.4, continue calcium carbonate 1 tab TID   Diabetes mellitus type II with chronic kidney disease: insulin dependent. Complicated with diabetic foot ulcer. History of poor control. Hemoglobin A1c 9.2% on 09/19/2020.  Vascular performed right lower angiogram. 30% mild lesion found popliteal artery. Deemed adequate for wound healing.  Podiatry performed right foot, partial fifth toe amputation on 10/21/20.  Dressing changes determine need for further amputation. Patient requesting second opinion.  - Currently receiving Metronidazole and Fortaz. Tressie Ellis given with dialysis.  -  Appreciate podiatry and vascular input.  - Glucose elevated, SSI managed by primary team - Spoke with patient about goals in relation to right foot and she states she wants to be home with family. Discussed the current state of right foot and the care teams concerns, including the possibility of sepsis. She clearly stated she wants to continue with antibiotic therapy.    6.    Hyponatremia: Na 127, Will correct with dialysis    LOS: 18 Rhett Najera 9/8/202212:29 PM

## 2020-11-02 NOTE — Progress Notes (Signed)
PT Cancellation Note  Patient Details Name: [name] MRN: [id number] DOB: Jun 30, 1970   Cancelled Treatment:    Reason Eval/Treat Not Completed: Patient at procedure or test/unavailable (Chart reviewed, RN consulted. Pt going off floor for HD at this time. Will resume services at later date/time.)  8:27 AM, 11/02/20 Etta Grandchild, PT, DPT Physical Therapist - [facility]  [phone] ([place])    [place] 11/02/2020, 8:27 AM

## 2020-11-02 NOTE — TOC Progression Note (Signed)
Transition of Care [facility]) - Progression Note    Patient Details  Name: TRENNA KIELY MRN: [id number] Date of Birth: 1971/02/26  Transition of Care [facility] CM/SW [place], RN Phone Number: 11/02/2020, 3:49 PM  Clinical Narrative:    Reached out to the4 local facilities and requested them to review for a potential bed offer, awaiting repsonce   Expected Discharge Plan: Home/Self Care Barriers to Discharge: Continued Medical Work up  Expected Discharge Plan and Services Expected Discharge Plan: Home/Self Care   Discharge Planning Services: CM Consult   Living arrangements for the past 2 months: Single Family Home                 DME Arranged: [place] rolling DME Agency: [employer] Date DME Agency Contacted: 10/19/20 Time DME Agency Contacted: [phone] Representative spoke with at DME Agency: [place] Determinants of Health ([place]) Interventions    Readmission Risk Interventions No flowsheet data found.

## 2020-11-02 NOTE — Progress Notes (Signed)
Tx Initiated without incident, successfully cannulated 2x, uf goal of 3.5kg.

## 2020-11-02 NOTE — Progress Notes (Signed)
Tx completed without incident, 3.5kg pulled successful.

## 2020-11-02 NOTE — Progress Notes (Signed)
PROGRESS NOTE    [name]  KGY:[id number] DOB: 04/14/1970 DOA: 10/15/2020 PCP: [name], NP    Brief Narrative:  Patient admitted 15 days ago with diabetic foot ulcers with gangrene of the fifth toe and osteomyelitis.  Past medical history chronic diastolic congestive heart failure, chronic hypoxic respiratory failure on 3 to 4 L of oxygen, end-stage renal disease with diabetes, morbid obesity.  On 10/21/2020 had amputation of the fifth ray and I&D of abscess on the right foot.  On 10/26/2020 had washout procedure.  The patient initially was on IV Zosyn.  Switched over to ceftazidime with dosing with dialysis at this point.  Specialist had recommended amputation but patient is not ready to make that decision at this point in time.   Assessment & Plan:   Principal Problem:   Diabetic foot ulcer ([place]) Active Problems:   Diabetes ([place])   Obesity, Class III, BMI 40-49.9 (morbid obesity) ([place])   Essential hypertension   Type 2 diabetes mellitus with ESRD (end-stage renal disease) ([place])   DNR (do not resuscitate) discussion   End-stage renal disease on hemodialysis ([place])   Chronic respiratory failure with hypoxia ([facility])   Decreased pedal pulses   Gangrene (HCC)   PVD (peripheral vascular disease) ([facility])   Aspiration into airway   Hearing loss of right ear   Hyponatremia   Anemia of chronic disease  Diabetic fold associate with gangrene of the fifth toe associated with osteomyelitis Status post amputation of fifth ray and I&D on 8/27 Washout procedure on 10/26/2020 ID and podiatry following Reaffirmed with patient, she is declining amputation at this time Encouraged her not to delay her decision and seek out surgical evaluation when she is ready to make the decision Plan: IV vancomycin and ceftazidime with dialysis Will need 4 weeks of therapy Appreciate ID follow-up Outpatient antibiotic regimen in place.  Patient is medically stable for discharge at this time  ESRD on  HD Nephrology consulted TTS dialysis schedule Follow nephrology recommendation for inpatient HD needs  Diabetes mellitus type 2, uncontrolled Semglee 40 units nightly NovoLog and sliding scale Carb modified diet  Anemia of chronic kidney disease Stable, will monitor  Peripheral vascular disease Continue aspirin  Hyponatremia Managed with hemodialysis  Chronic hypoxic respiratory failure At baseline, 3 to 4 L  Morbid obesity This complicates overall care and prognosis  Weakness Functional decline Will need skilled nursing facility at time of discharge      DVT prophylaxis: SQ Lovenox Code Status: Full Family Communication: None today Disposition Plan: Status is: Inpatient  Remains inpatient appropriate because:Unsafe d/c plan  Dispo: The patient is from: Home              Anticipated d/c is to: SNF              Patient currently is medically stable to d/c.   Difficult to place patient No  Patient is refusing amputation as recommended by our surgical teams.  As such she is medically stable for discharge at this time.  She will be on 4 weeks of antibiotics.  TOC aware, bed search initiated.  Patient can discharge once SNF bed is found.             Level of care: Med-Surg  Consultants:  ID Podiatry Nephrology  Procedures:  8/24 angiogram right lower extremity 8/27 amputation of the fifth ray and I&D of abscess of the right foot 9/1 washout procedure  Antimicrobials:  Vancomycin Ceftazidime   Subjective: Patient  seen and examined during hemodialysis.  Resting comfortably in bed.  No visible distress.  Again discussed her decision regarding recommended amputation.  She reaffirms that she is declining at this time and will seek out follow-up at tertiary care facility  Objective: Vitals:   11/02/20 1115 11/02/20 1130 11/02/20 1145 11/02/20 1200  BP: 138/61 (!) 145/86 132/79 (!) 148/93  Pulse:   (!) 37 91  Resp: 18 17 15 17   Temp:    98.4 F  (36.9 C)  TempSrc:    Oral  SpO2: 100% 100% 93% 100%  Weight:      Height:        Intake/Output Summary (Last 24 hours) at 11/02/2020 1230 Last data filed at 11/02/2020 1200 Gross per 24 hour  Intake --  Output 3500 ml  Net -3500 ml   Filed Weights   10/15/20 1905 10/18/20 0928  Weight: 126.1 kg 126.1 kg    Examination:  General exam: Appears calm and comfortable  Respiratory system: Clear to auscultation. Respiratory effort normal. Cardiovascular system: S1-S2, RRR, no murmurs, no pedal edema Gastrointestinal system: Obese, nontender, nondistended, normal bowel sounds Central nervous system: Alert and oriented. No focal neurological deficits. Extremities: Decreased power bilateral lower extremities Skin: Lower leg with skin darkening, significant nonpitting edema.  Right foot with significant wounds Psychiatry: Judgement and insight appear normal. Mood & affect appropriate.     Data Reviewed: I have personally reviewed following labs and imaging studies  CBC: Recent Labs  Lab 10/26/20 1619 10/29/20 0523 10/31/20 0408  WBC  --  19.8* 15.3*  NEUTROABS  --   --  12.6*  HGB 9.9* 8.1* 7.5*  HCT 29.0* 25.3* 23.4*  MCV  --  95.5 95.5  PLT  --  341 664   Basic Metabolic Panel: Recent Labs  Lab 10/26/20 1619 10/29/20 0523 10/31/20 0408  NA 130* 129* 127*  K 4.0 3.8 3.8  CL 95* 91* 90*  CO2  --  23 23  GLUCOSE 143* 277* 266*  BUN 36* 43* 48*  CREATININE 6.20* 7.04* 7.53*  CALCIUM  --  6.9* 6.4*  PHOS  --  5.6*  --    GFR: Estimated Creatinine Clearance: 12 mL/min (A) (by C-G formula based on SCr of 7.53 mg/dL (H)). Liver Function Tests: No results for input(s): AST, ALT, ALKPHOS, BILITOT, PROT, ALBUMIN in the last 168 hours. No results for input(s): LIPASE, AMYLASE in the last 168 hours. No results for input(s): AMMONIA in the last 168 hours. Coagulation Profile: No results for input(s): INR, PROTIME in the last 168 hours.  Cardiac Enzymes: No results for  input(s): CKTOTAL, CKMB, CKMBINDEX, TROPONINI in the last 168 hours. BNP (last 3 results) No results for input(s): PROBNP in the last 8760 hours. HbA1C: No results for input(s): HGBA1C in the last 72 hours. CBG: Recent Labs  Lab 11/01/20 1150 11/01/20 1454 11/01/20 1630 11/01/20 2050 11/02/20 0739  GLUCAP 195* 260* 258* 104* 230*   Lipid Profile: No results for input(s): CHOL, HDL, LDLCALC, TRIG, CHOLHDL, LDLDIRECT in the last 72 hours. Thyroid Function Tests: No results for input(s): TSH, T4TOTAL, FREET4, T3FREE, THYROIDAB in the last 72 hours. Anemia Panel: No results for input(s): VITAMINB12, FOLATE, FERRITIN, TIBC, IRON, RETICCTPCT in the last 72 hours. Sepsis Labs: No results for input(s): PROCALCITON, LATICACIDVEN in the last 168 hours.  Recent Results (from the past 240 hour(s))  Aerobic/Anaerobic Culture w Gram Stain (surgical/deep wound)     Status: None   Collection Time: 10/26/20  5:35  PM   Specimen: PATH Other; Tissue  Result Value Ref Range Status   Specimen Description   Final    FOOT RIGHT Performed at [facility], [address] [place], [place] [address]    Special Requests   Final    NONE Performed at [facility], [place], [place] [address]    Gram Stain NO WBC SEEN NO ORGANISMS SEEN   Final   Culture   Final    RARE STREPTOCOCCUS ANGINOSIS NO ANAEROBES ISOLATED Performed at [facility], [place] [address], [place], [place] [address]    Report Status 10/31/2020 FINAL  Final   Organism ID, Bacteria STREPTOCOCCUS ANGINOSIS  Final      Susceptibility   Streptococcus anginosis - MIC*    PENICILLIN 0.12 SENSITIVE Sensitive     CEFTRIAXONE 0.5 SENSITIVE Sensitive     ERYTHROMYCIN >=8 RESISTANT Resistant     LEVOFLOXACIN 0.5 SENSITIVE Sensitive     VANCOMYCIN 0.5 SENSITIVE Sensitive     * RARE STREPTOCOCCUS ANGINOSIS         Radiology Studies: No results found.      Scheduled Meds:  vitamin C   500 mg Oral Daily   aspirin EC  81 mg Oral Daily   calcium carbonate  1 tablet Oral TID with meals   Chlorhexidine Gluconate Cloth  6 each Topical Daily   cholecalciferol  4,000 Units Oral Weekly   cinacalcet  60 mg Oral Daily   ciprofloxacin-dexamethasone  4 drop Both EARS BID   docusate sodium  100 mg Oral Daily   epoetin (EPOGEN/PROCRIT) injection  10,000 Units Intravenous Q T,Th,Sa-HD   feeding supplement (NEPRO CARB STEADY)  237 mL Oral BID BM   ferric citrate  210 mg Oral TID WC   heparin  5,000 Units Subcutaneous Q8H   insulin aspart  0-5 Units Subcutaneous QHS   insulin aspart  0-9 Units Subcutaneous TID WC   insulin aspart  2 Units Subcutaneous TID WC   insulin glargine-yfgn  40 Units Subcutaneous QHS   ipratropium-albuterol  3 mL Nebulization TID   linaclotide  290 mcg Oral q morning   loratadine  10 mg Oral Daily   metroNIDAZOLE  500 mg Oral Q12H   mometasone-formoterol  2 puff Inhalation BID   multivitamin  1 tablet Oral QHS   pantoprazole  40 mg Oral Daily   sodium chloride flush  3 mL Intravenous Q12H   Continuous Infusions:  sodium chloride 0 mL/hr at 10/22/20 1200   [START ON 11/07/2020] cefTAZidime (FORTAZ)  IV     cefTAZidime (FORTAZ)  IV     [START ON 11/04/2020] cefTAZidime (FORTAZ)  IV     vancomycin 1,000 mg (11/02/20 1112)     LOS: 18 days    Time spent: 15 minutes    Sidney Ace, MD [facility] Pager [phone]  If 7PM-7AM, please contact night-coverage  11/02/2020, 12:30 PM

## 2020-11-03 DIAGNOSIS — E11621 Type 2 diabetes mellitus with foot ulcer: Secondary | ICD-10-CM | POA: Diagnosis not present

## 2020-11-03 DIAGNOSIS — L97514 Non-pressure chronic ulcer of other part of right foot with necrosis of bone: Secondary | ICD-10-CM | POA: Diagnosis not present

## 2020-11-03 LAB — CBC
HCT: 25.4 % — ABNORMAL LOW (ref 36.0–46.0)
Hemoglobin: 8.1 g/dL — ABNORMAL LOW (ref 12.0–15.0)
MCH: 30.1 pg (ref 26.0–34.0)
MCHC: 31.9 g/dL (ref 30.0–36.0)
MCV: 94.4 fL (ref 80.0–100.0)
Platelets: 309 10*3/uL (ref 150–400)
RBC: 2.69 MIL/uL — ABNORMAL LOW (ref 3.87–5.11)
RDW: 14.4 % (ref 11.5–15.5)
WBC: 13.5 10*3/uL — ABNORMAL HIGH (ref 4.0–10.5)
nRBC: 0 % (ref 0.0–0.2)

## 2020-11-03 LAB — GLUCOSE, CAPILLARY
Glucose-Capillary: 208 mg/dL — ABNORMAL HIGH (ref 70–99)
Glucose-Capillary: 190 mg/dL — ABNORMAL HIGH (ref 70–99)
Glucose-Capillary: 212 mg/dL — ABNORMAL HIGH (ref 70–99)
Glucose-Capillary: 171 mg/dL — ABNORMAL HIGH (ref 70–99)

## 2020-11-03 LAB — RENAL FUNCTION PANEL
Albumin: 2.4 g/dL — ABNORMAL LOW (ref 3.5–5.0)
Anion gap: 16 — ABNORMAL HIGH (ref 5–15)
BUN: 46 mg/dL — ABNORMAL HIGH (ref 6–20)
CO2: 22 mmol/L (ref 22–32)
Calcium: 7.1 mg/dL — ABNORMAL LOW (ref 8.9–10.3)
Chloride: 87 mmol/L — ABNORMAL LOW (ref 98–111)
Creatinine, Ser: 7.3 mg/dL — ABNORMAL HIGH (ref 0.44–1.00)
GFR, Estimated: 6 mL/min — ABNORMAL LOW (ref >60)
Glucose, Bld: 198 mg/dL — ABNORMAL HIGH (ref 70–99)
Phosphorus: 4.3 mg/dL (ref 2.5–4.6)
Potassium: 3.6 mmol/L (ref 3.5–5.1)
Sodium: 125 mmol/L — ABNORMAL LOW (ref 135–145)

## 2020-11-03 MED ORDER — INSULIN GLARGINE-YFGN 100 UNIT/ML ~~LOC~~ SOLN
43.0000 [IU] | Freq: Every day | SUBCUTANEOUS | Status: DC
  Administered 2020-11-03 – 2020-11-09 (×7): 43 [IU] via SUBCUTANEOUS
  Filled 2020-11-03 (×8): qty 0.43

## 2020-11-03 MED ORDER — INSULIN ASPART 100 UNIT/ML IJ SOLN
3.0000 [IU] | Freq: Three times a day (TID) | INTRAMUSCULAR | Status: DC
  Administered 2020-11-03 – 2020-11-10 (×20): 3 [IU] via SUBCUTANEOUS
  Filled 2020-11-03 (×20): qty 1

## 2020-11-03 NOTE — Progress Notes (Signed)
Physical Therapy Treatment Patient Details Name: [name] MRN: [id number] DOB: 10-14-1970 Today's Date: 11/03/2020    History of Present Illness [name]  is a 50 y.o. female presenting to hospital 8/21 with chest pain, fever, fatigue, and generalized malaise; also foul smell from R great toe (chronic ulcer).  Pt admitted with diabetic foot ulcers with gangrene of the 5th toe and osteomyelitis. S/p R LE angio 8/24; s/p partial R foot 5th toe amp 8/27; and s/p I&D with revisional debridement R foot 9/1.  Per PT order 9/2: pt [name] R foot. PMH includes DM, ESRD, anxiety, CHF, CKD, PNA, chronic respiratory failure with hypoxia and hypercapnia.    PT Comments    Pt in bed upon entry, husband at bedside. Pt agreeable to session. Continued to work on lateral scoot techniques, fitness, and confidence today, again no frank balance concerns, just getting used to metabolic demand- pt moved to 4L for exertion as per her baseline. Author introduces a slide board this date to help pt be more familiar and less anxious about future potential use. Pt also started with STS transfer training, much more anxiety provoking, but takes calming breaks between efforts. Pt performing with a BRW ordered for her this admission and from 25" heigh EOB. Extensive cues for technique to maximize potential for NWB performance on Rt which she still is unable to manage due to weakness. Pt left in recliner at end of session, offered words of encouragement for her efforts, resilience during anxiety, and progress since 2 days prior.    Follow Up Recommendations  SNF;Supervision for mobility/OOB     Equipment Recommendations  None recommended by PT    Recommendations for Other Services OT consult     Precautions / Restrictions Precautions Precautions: Fall Restrictions Other Position/Activity Restrictions: [place] R foot    Mobility  Bed Mobility Overal bed mobility: Modified Independent Bed Mobility: Supine to  Sit     Supine to sit: Modified independent (Device/Increase time)     General bed mobility comments: no balance issues, author helps with socks    Transfers Overall transfer level: Needs assistance Equipment used: Rolling walker (2 wheeled) (bari RW) Transfers: Lateral/Scoot Transfers Sit to Stand: Min guard;From elevated surface (25" high EOB)        Lateral/Scoot Transfers: Supervision (removed foot of bed, Left recliner up against FOB. Used a slide board to cross from mattress to recliner) General transfer comment: 3x STS training, cues for Left foot position central to COM in frontal and sagittal planes. heavy attempts to maintain TTWB, unable to perform NWB, gets closer each time; each attempt stirs anxiety, which requires 3-4 minutes to calm.  Ambulation/Gait                 Stairs             Wheelchair Mobility    Modified Rankin (Stroke Patients Only)       Balance                                            Cognition Arousal/Alertness: Awake/alert Behavior During Therapy: WFL for tasks assessed/performed Overall Cognitive Status: Within Functional Limits for tasks assessed                                 General Comments: anxiety  issues fluctuate, gets itchy all over when nervous      Exercises      General Comments        Pertinent Vitals/Pain Pain Assessment: 0-10 Pain Score: 7  Pain Location: R foot Pain Intervention(s): Limited activity within patient's tolerance;Monitored during session    Home Living                      Prior Function            PT Goals (current goals can now be found in the care plan section) Acute Rehab PT Goals Patient Stated Goal: to improve mobility PT Goal Formulation: With patient Time For Goal Achievement: 11/10/20 Potential to Achieve Goals: Fair Progress towards PT goals: Progressing toward goals    Frequency    Min 2X/week      PT Plan  Current plan remains appropriate    Co-evaluation              AM-PAC PT "6 Clicks" Mobility   Outcome Measure  Help needed turning from your back to your side while in a flat bed without using bedrails?: None Help needed moving from lying on your back to sitting on the side of a flat bed without using bedrails?: None Help needed moving to and from a bed to a chair (including a wheelchair)?: A Lot Help needed standing up from a chair using your arms (e.g., wheelchair or bedside chair)?: A Lot Help needed to walk in hospital room?: Total Help needed climbing 3-5 steps with a railing? : Total 6 Click Score: 14    End of Session Equipment Utilized During Treatment: Oxygen Activity Tolerance: Patient tolerated treatment well;Patient limited by fatigue Patient left: in bed;with nursing/sitter in room;with call bell/phone within reach Nurse Communication: Mobility status;Precautions PT Visit Diagnosis: Other abnormalities of gait and mobility (R26.89);Muscle weakness (generalized) (M62.81);Pain Pain - Right/Left: Right Pain - part of body: Ankle and joints of foot     Time: 5176-1607 PT Time Calculation (min) (ACUTE ONLY): 38 min  Charges:  $Therapeutic Exercise: 23-37 mins $Therapeutic Activity: 8-22 mins                    12:39 PM, 11/03/20 Etta Grandchild, PT, DPT Physical Therapist - [facility]  [phone] ([place])       [place] C 11/03/2020, 12:35 PM

## 2020-11-03 NOTE — Progress Notes (Signed)
Pharmacy Antibiotic Note  [name] is a 50 y.o. female admitted on 10/15/2020 with Diabetic foot infection with gangrene and osteomyelitis of 5th toe. S/P amputation on 8/27 and wash out and debridement 10/26/20. Patient has PMH of ESRD on HD. Per provider, patient keeps losing IV access and needs medication that can be given during dialysis. ID has recommended change from Zosyn to Ceftazidime. Pharmacy has been consulted for  Vancomycin dosing.  Height: 5' 4.5" (163.8 cm) Weight: 126.1 kg (278 lb) IBW/kg (Calculated) : 55.85  Temp (24hrs), Avg:98.6 F (37 C), Min:97.6 F (36.4 C), Max:99.4 F (37.4 C)  Recent Labs  Lab 10/29/20 0523 10/31/20 0408 11/03/20 0838  WBC 19.8* 15.3* 13.5*  CREATININE 7.04* 7.53* 7.30*     Estimated Creatinine Clearance: 12.4 mL/min (A) (by C-G formula based on SCr of 7.3 mg/dL (H)).    Allergies  Allergen Reactions   Dust Mite Extract Shortness Of Breath and Itching   Mold Extract [Trichophyton] Shortness Of Breath and Itching   Pollen Extract Shortness Of Breath    Itching, shortness of breath, asthma like symptoms   Sulfa Antibiotics Itching   Feraheme [Ferumoxytol] Itching    Uses benadryl so she can get the medicine    Antimicrobials this admission: 8/21 Ceftriaxone x 1 8/21 vancomycin>> 8/25 8/22 Cefepime>>8/25 8/26 Unasyn>> 8/29 8/26 Zosyn>> 9/4 9/4 Ceftazidime>>   Microbiology results: 9/1 aerobic/anaerobic foot Cx:  RARE STREPTOCOCCUS ANGINOSIS  8/23 Wound Cx: ENTEROCOCCUS FAECALIS, KLEBSIELLA OXYTOCA   Plan: Per discussion with ID,  add vancomycin for better streptococcus and enterococcus coverage that ceftazidime lacks.  Vancomycin 2.5gm IV x 1 then 1gm IV qHD TTS Check pre-HD level prior to 3-4th HD session.  Goal level 15-25 mcg/mL Received vancomycin load on 9/6 and 1000mg  on 9/8. Will check vancomycin random level prior to HD on Tuesday 11/07/20.  Uliana Brinker Rodriguez-Guzman PharmD, BCPS 11/03/2020 2:31 PM

## 2020-11-03 NOTE — Progress Notes (Signed)
PROGRESS NOTE    [name]  AST:[id number] DOB: 12/19/1970 DOA: 10/15/2020 PCP: [name], NP    Brief Narrative:  Patient admitted 15 days ago with diabetic foot ulcers with gangrene of the fifth toe and osteomyelitis.  Past medical history chronic diastolic congestive heart failure, chronic hypoxic respiratory failure on 3 to 4 L of oxygen, end-stage renal disease with diabetes, morbid obesity.  On 10/21/2020 had amputation of the fifth ray and I&D of abscess on the right foot.  On 10/26/2020 had washout procedure.  The patient initially was on IV Zosyn.  Switched over to ceftazidime with dosing with dialysis at this point.  Specialist had recommended amputation but patient is not ready to make that decision at this point in time.   Assessment & Plan:   Principal Problem:   Diabetic foot ulcer ([place]) Active Problems:   Diabetes ([place])   Obesity, Class III, BMI 40-49.9 (morbid obesity) ([place])   Essential hypertension   Type 2 diabetes mellitus with ESRD (end-stage renal disease) ([place])   DNR (do not resuscitate) discussion   End-stage renal disease on hemodialysis ([place])   Chronic respiratory failure with hypoxia (HCC)   Decreased pedal pulses   Gangrene (HCC)   PVD (peripheral vascular disease) (HCC)   Aspiration into airway   Hearing loss of right ear   Hyponatremia   Anemia of chronic disease  Diabetic fold associate with gangrene of the fifth toe associated with osteomyelitis Status post amputation of fifth ray and I&D on 8/27 Washout procedure on 10/26/2020 ID and podiatry following Reaffirmed with patient, she is declining amputation at this time Encouraged her not to delay her decision and seek out surgical evaluation when she is ready to make the decision Plan: IV vancomycin and ceftazidime with dialysis Will need 4 weeks of therapy Appreciate ID follow-up Outpatient antibiotic regimen in place.   Patient is medically stable for discharge at this time  ESRD on  HD Nephrology consulted TTS dialysis schedule Follow nephrology recommendation for inpatient HD needs Patient will need a facility to accommodate antibiotics with dialysis  Diabetes mellitus type 2, uncontrolled Semglee 43 units nightly NovoLog 3 units 3 times daily with meals NovoLog and sliding scale Carb modified diet  Anemia of chronic kidney disease Stable, will monitor  Peripheral vascular disease Continue aspirin  Hyponatremia Managed with hemodialysis  Chronic hypoxic respiratory failure At baseline, 3 to 4 L  Morbid obesity This complicates overall care and prognosis  Weakness Functional decline Will need skilled nursing facility at time of discharge      DVT prophylaxis: SQ Lovenox Code Status: Full Family Communication: None today Disposition Plan: Status is: Inpatient  Remains inpatient appropriate because:Unsafe d/c plan  Dispo: The patient is from: Home              Anticipated d/c is to: SNF              Patient currently is medically stable to d/c.   Difficult to place patient No  Patient is refusing amputation as recommended by our surgical teams.  As such she is medically stable for discharge at this time.  She will be on 4 weeks of antibiotics.  TOC aware, bed search initiated.  Patient can discharge once SNF bed is found.   Level of care: Med-Surg  Consultants:  ID Podiatry Nephrology  Procedures:  8/24 angiogram right lower extremity 8/27 amputation of the fifth ray and I&D of abscess of the right foot 9/1 washout procedure  Antimicrobials:  Vancomycin Ceftazidime   Subjective: Patient seen and examined in the room.  Husband at bedside.  No visible distress.  Reaffirmed her decision to decline amputation at this time.  Objective: Vitals:   11/03/20 0358 11/03/20 0631 11/03/20 0813 11/03/20 1223  BP: (!) 103/55  124/76 118/79  Pulse: 96  95 99  Resp: 18  16 16   Temp: 98.4 F (36.9 C)  97.6 F (36.4 C) 99.4 F (37.4 C)   TempSrc: Oral  Oral Oral  SpO2: 97% 98% 92% 98%  Weight:      Height:        Intake/Output Summary (Last 24 hours) at 11/03/2020 1304 Last data filed at 11/03/2020 1024 Gross per 24 hour  Intake 0 ml  Output 0 ml  Net 0 ml   Filed Weights   10/15/20 1905 10/18/20 0928  Weight: 126.1 kg 126.1 kg    Examination:  General exam: Appears calm and comfortable  Respiratory system: Clear to auscultation. Respiratory effort normal. Cardiovascular system: S1-S2, RRR, no murmurs, no pedal edema Gastrointestinal system: Obese, nontender, nondistended, normal bowel sounds Central nervous system: Alert and oriented. No focal neurological deficits. Extremities: Decreased power bilateral lower extremities Skin: Lower leg with skin darkening, significant nonpitting edema.  Right foot with significant wounds Psychiatry: Judgement and insight appear normal. Mood & affect appropriate.     Data Reviewed: I have personally reviewed following labs and imaging studies  CBC: Recent Labs  Lab 10/29/20 0523 10/31/20 0408 11/03/20 0838  WBC 19.8* 15.3* 13.5*  NEUTROABS  --  12.6*  --   HGB 8.1* 7.5* 8.1*  HCT 25.3* 23.4* 25.4*  MCV 95.5 95.5 94.4  PLT 341 296 086   Basic Metabolic Panel: Recent Labs  Lab 10/29/20 0523 10/31/20 0408 11/03/20 0838  NA 129* 127* 125*  K 3.8 3.8 3.6  CL 91* 90* 87*  CO2 23 23 22   GLUCOSE 277* 266* 198*  BUN 43* 48* 46*  CREATININE 7.04* 7.53* 7.30*  CALCIUM 6.9* 6.4* 7.1*  PHOS 5.6*  --  4.3   GFR: Estimated Creatinine Clearance: 12.4 mL/min (A) (by C-G formula based on SCr of 7.3 mg/dL (H)). Liver Function Tests: Recent Labs  Lab 11/03/20 0838  ALBUMIN 2.4*   No results for input(s): LIPASE, AMYLASE in the last 168 hours. No results for input(s): AMMONIA in the last 168 hours. Coagulation Profile: No results for input(s): INR, PROTIME in the last 168 hours.  Cardiac Enzymes: No results for input(s): CKTOTAL, CKMB, CKMBINDEX, TROPONINI in  the last 168 hours. BNP (last 3 results) No results for input(s): PROBNP in the last 8760 hours. HbA1C: No results for input(s): HGBA1C in the last 72 hours. CBG: Recent Labs  Lab 11/02/20 1352 11/02/20 1651 11/02/20 2257 11/03/20 0814 11/03/20 1147  GLUCAP 142* 213* 284* 208* 190*   Lipid Profile: No results for input(s): CHOL, HDL, LDLCALC, TRIG, CHOLHDL, LDLDIRECT in the last 72 hours. Thyroid Function Tests: No results for input(s): TSH, T4TOTAL, FREET4, T3FREE, THYROIDAB in the last 72 hours. Anemia Panel: No results for input(s): VITAMINB12, FOLATE, FERRITIN, TIBC, IRON, RETICCTPCT in the last 72 hours. Sepsis Labs: No results for input(s): PROCALCITON, LATICACIDVEN in the last 168 hours.  Recent Results (from the past 240 hour(s))  Aerobic/Anaerobic Culture w Gram Stain (surgical/deep wound)     Status: None   Collection Time: 10/26/20  5:35 PM   Specimen: PATH Other; Tissue  Result Value Ref Range Status   Specimen Description   Final  FOOT RIGHT Performed at [facility], [address] [place], [place] [address]    Special Requests   Final    NONE Performed at [facility], [place], [place] [address]    Gram Stain NO WBC SEEN NO ORGANISMS SEEN   Final   Culture   Final    RARE STREPTOCOCCUS ANGINOSIS NO ANAEROBES ISOLATED Performed at [facility], [place] [address] [place], [place] [address]    Report Status 10/31/2020 FINAL  Final   Organism ID, Bacteria STREPTOCOCCUS ANGINOSIS  Final      Susceptibility   Streptococcus anginosis - MIC*    PENICILLIN 0.12 SENSITIVE Sensitive     CEFTRIAXONE 0.5 SENSITIVE Sensitive     ERYTHROMYCIN >=8 RESISTANT Resistant     LEVOFLOXACIN 0.5 SENSITIVE Sensitive     VANCOMYCIN 0.5 SENSITIVE Sensitive     * RARE STREPTOCOCCUS ANGINOSIS         Radiology Studies: No results found.      Scheduled Meds:  vitamin C  500 mg Oral Daily   aspirin EC  81 mg Oral Daily    calcium carbonate  1 tablet Oral TID with meals   Chlorhexidine Gluconate Cloth  6 each Topical Daily   cholecalciferol  4,000 Units Oral Weekly   cinacalcet  60 mg Oral Daily   ciprofloxacin-dexamethasone  4 drop Both EARS BID   docusate sodium  100 mg Oral Daily   epoetin (EPOGEN/PROCRIT) injection  10,000 Units Intravenous Q T,Th,Sa-HD   feeding supplement (NEPRO CARB STEADY)  237 mL Oral BID BM   ferric citrate  210 mg Oral TID WC   heparin  5,000 Units Subcutaneous Q8H   insulin aspart  0-5 Units Subcutaneous QHS   insulin aspart  0-9 Units Subcutaneous TID WC   insulin aspart  3 Units Subcutaneous TID WC   insulin glargine-yfgn  43 Units Subcutaneous QHS   ipratropium-albuterol  3 mL Nebulization TID   linaclotide  290 mcg Oral q morning   loratadine  10 mg Oral Daily   metroNIDAZOLE  500 mg Oral Q12H   mometasone-formoterol  2 puff Inhalation BID   multivitamin  1 tablet Oral QHS   pantoprazole  40 mg Oral Daily   sodium chloride flush  3 mL Intravenous Q12H   Continuous Infusions:  sodium chloride 0 mL/hr at 10/22/20 1200   [START ON 11/07/2020] cefTAZidime (FORTAZ)  IV     cefTAZidime (FORTAZ)  IV     [START ON 11/04/2020] cefTAZidime (FORTAZ)  IV     vancomycin Stopped (11/02/20 1357)     LOS: 19 days    Time spent: 15 minutes    Sidney Ace, MD [facility] Pager [phone]  If 7PM-7AM, please contact night-coverage  11/03/2020, 1:04 PM

## 2020-11-03 NOTE — Plan of Care (Deleted)

## 2020-11-03 NOTE — Progress Notes (Signed)
[facility]  ROUNDING NOTE   Subjective:   [name] was admitted to [facility] on 10/15/2020 for Gangrene [facility]) [I96] Diabetic foot ulcer ([place]) [E11.621, L97.509] Decreased pedal pulses [R09.89] Sepsis, due to unspecified organism, unspecified whether acute organ dysfunction present ([place]) [A41.9]  Patient seen resting in bed Husband at bedside Continues to complain of right foot pain and itching   Objective:  Vital signs in last 24 hours:  Temp:  [97.6 F (36.4 C)-99.4 F (37.4 C)] 99.4 F (37.4 C) (09/09 1223) Pulse Rate:  [94-99] 99 (09/09 1223) Resp:  [16-20] 16 (09/09 1223) BP: (103-147)/(55-84) 118/79 (09/09 1223) SpO2:  [92 %-98 %] 98 % (09/09 1223)  Weight change:  Filed Weights   10/15/20 1905 10/18/20 0928  Weight: 126.1 kg 126.1 kg    Intake/Output: I/O last 3 completed shifts: In: -  Out: 3500 [Other:3500]   Intake/Output this shift:  No intake/output data recorded.  Physical Exam: General: NAD,resting in bed  Head: Normocephalic, atraumatic. Moist oral mucosal membranes  Eyes: Anicteric  Lungs:  Clear to auscultation, O2 2L  Heart: Regular rate and rhythm  Abdomen:  Soft, nontender  Extremities: trace peripheral edema. Right foot fifth toe amputation on 10/21/20, ace wrapped dressing  Neurologic: Alert, moving all four extremities  Skin: No lesions  Access: Left AVF    Basic Metabolic Panel: Recent Labs  Lab 10/29/20 0523 10/31/20 0408 11/03/20 0838  NA 129* 127* 125*  K 3.8 3.8 3.6  CL 91* 90* 87*  CO2 23 23 22   GLUCOSE 277* 266* 198*  BUN 43* 48* 46*  CREATININE 7.04* 7.53* 7.30*  CALCIUM 6.9* 6.4* 7.1*  PHOS 5.6*  --  4.3     Liver Function Tests: Recent Labs  Lab 11/03/20 0838  ALBUMIN 2.4*    No results for input(s): LIPASE, AMYLASE in the last 168 hours. No results for input(s): AMMONIA in the last 168 hours.  CBC: Recent Labs  Lab 10/29/20 0523 10/31/20 0408 11/03/20 0838  WBC 19.8* 15.3* 13.5*   NEUTROABS  --  12.6*  --   HGB 8.1* 7.5* 8.1*  HCT 25.3* 23.4* 25.4*  MCV 95.5 95.5 94.4  PLT 341 296 309     Cardiac Enzymes: No results for input(s): CKTOTAL, CKMB, CKMBINDEX, TROPONINI in the last 168 hours.  BNP: Invalid input(s): POCBNP  CBG: Recent Labs  Lab 11/02/20 1352 11/02/20 1651 11/02/20 2257 11/03/20 0814 11/03/20 1147  GLUCAP 142* 213* 284* 208* 190*     Microbiology: Results for orders placed or performed during the hospital encounter of 10/15/20  Blood Culture (routine x 2)     Status: None   Collection Time: 10/15/20  8:03 PM   Specimen: BLOOD  Result Value Ref Range Status   Specimen Description BLOOD BLOOD RIGHT FOREARM  Final   Special Requests   Final    BOTTLES DRAWN AEROBIC AND ANAEROBIC Blood Culture adequate volume   Culture   Final    NO GROWTH 5 DAYS Performed at [facility], [address] [place], [place] [address]    Report Status 10/20/2020 FINAL  Final  Resp Panel by RT-PCR (Flu A&B, Covid) Nasopharyngeal Swab     Status: None   Collection Time: 10/15/20  8:26 PM   Specimen: Nasopharyngeal Swab; Nasopharyngeal(NP) swabs in vial transport medium  Result Value Ref Range Status   SARS Coronavirus 2 by RT PCR NEGATIVE NEGATIVE Final    Comment: (NOTE) SARS-CoV-2 target nucleic acids are NOT DETECTED.  The SARS-CoV-2 RNA is generally detectable in upper respiratory specimens during the acute phase of infection. The lowest concentration of SARS-CoV-2 viral copies this assay can detect is 138 copies/mL. A negative result does not preclude SARS-Cov-2 infection and should not be used as the sole basis for treatment or other patient management decisions. A negative result may occur with  improper specimen collection/handling, submission of specimen other than nasopharyngeal swab, presence of viral mutation(s) within the areas targeted by this assay, and inadequate number of viral copies(<138 copies/mL). A negative result  must be combined with clinical observations, patient history, and epidemiological information. The expected result is Negative.  Fact Sheet for Patients:  EntrepreneurPulse.com.au  Fact Sheet for Healthcare Providers:  IncredibleEmployment.be  This test is no t yet approved or cleared by the [place] FDA and  has been authorized for detection and/or diagnosis of SARS-CoV-2 by FDA under an Emergency Use Authorization (EUA). This EUA will remain  in effect (meaning this test can be used) for the duration of the COVID-19 declaration under Section 564(b)(1) of the Act, 21 U.S.C.section 360bbb-3(b)(1), unless the authorization is terminated  or revoked sooner.       Influenza A by PCR NEGATIVE NEGATIVE Final   Influenza B by PCR NEGATIVE NEGATIVE Final    Comment: (NOTE) The Xpert Xpress SARS-CoV-2/FLU/RSV plus assay is intended as an aid in the diagnosis of influenza from Nasopharyngeal swab specimens and should not be used as a sole basis for treatment. Nasal washings and aspirates are unacceptable for Xpert Xpress SARS-CoV-2/FLU/RSV testing.  Fact Sheet for Patients: EntrepreneurPulse.com.au  Fact Sheet for Healthcare Providers: IncredibleEmployment.be  This test is not yet approved or cleared by the [place] FDA and has been authorized for detection and/or diagnosis of SARS-CoV-2 by FDA under an Emergency Use Authorization (EUA). This EUA will remain in effect (meaning this test can be used) for the duration of the COVID-19 declaration under Section 564(b)(1) of the Act, 21 U.S.C. section 360bbb-3(b)(1), unless the authorization is terminated or revoked.  Performed at [facility], [address] [place], [place] [address]   Blood Culture (routine x 2)     Status: None   Collection Time: 10/15/20  8:26 PM   Specimen: BLOOD  Result Value Ref Range Status   Specimen Description  BLOOD RIGHT ANTECUBITAL  Final   Special Requests   Final    BOTTLES DRAWN AEROBIC AND ANAEROBIC Blood Culture adequate volume   Culture   Final    NO GROWTH 5 DAYS Performed at [facility], [address] [place], [place] [address]    Report Status 10/20/2020 FINAL  Final  Aerobic Culture w Gram Stain (superficial specimen)     Status: None   Collection Time: 10/17/20  8:52 PM   Specimen: Wound  Result Value Ref Range Status   Specimen Description WOUND  Final   Special Requests NONE  Final   Gram Stain   Final    RARE SQUAMOUS EPITHELIAL CELLS PRESENT FEW WBC PRESENT, PREDOMINANTLY MONONUCLEAR ABUNDANT GRAM VARIABLE ROD FEW GRAM POSITIVE COCCI    Culture   Final    MODERATE ENTEROCOCCUS FAECALIS MODERATE KLEBSIELLA OXYTOCA ABUNDANT DIPHTHEROIDS(CORYNEBACTERIUM SPECIES) Standardized susceptibility testing for this organism is not available.    Report Status 10/20/2020 FINAL  Final   Organism ID, Bacteria ENTEROCOCCUS FAECALIS  Final   Organism ID, Bacteria KLEBSIELLA OXYTOCA  Final      Susceptibility   Enterococcus faecalis - MIC*    AMPICILLIN <=2 SENSITIVE  Sensitive     VANCOMYCIN 1 SENSITIVE Sensitive     GENTAMICIN SYNERGY RESISTANT Resistant     * MODERATE ENTEROCOCCUS FAECALIS   Klebsiella oxytoca - MIC*    AMPICILLIN >=32 RESISTANT Resistant     CEFAZOLIN <=4 SENSITIVE Sensitive     CEFEPIME <=0.12 SENSITIVE Sensitive     CEFTAZIDIME <=1 SENSITIVE Sensitive     CEFTRIAXONE <=0.25 SENSITIVE Sensitive     CIPROFLOXACIN <=0.25 SENSITIVE Sensitive     GENTAMICIN <=1 SENSITIVE Sensitive     IMIPENEM <=0.25 SENSITIVE Sensitive     TRIMETH/SULFA <=20 SENSITIVE Sensitive     AMPICILLIN/SULBACTAM 8 SENSITIVE Sensitive     PIP/TAZO <=4 SENSITIVE Sensitive     * MODERATE KLEBSIELLA OXYTOCA  Aerobic/Anaerobic Culture w Gram Stain (surgical/deep wound)     Status: None   Collection Time: 10/21/20  3:07 PM   Specimen: PATH Digit amputation; Tissue  Result  Value Ref Range Status   Specimen Description   Final    FOOT  RIGHT 5TH DIGIT AMPUTATION Performed at [facility], [address], [place], [place] [address]    Special Requests   Final    NONE Performed at [facility], [place], [place] [address]    Gram Stain   Final    FEW SQUAMOUS EPITHELIAL CELLS PRESENT FEW WBC SEEN FEW GRAM POSITIVE COCCI    Culture   Final    FEW STREPTOCOCCUS ANGINOSIS NO ANAEROBES ISOLATED Performed at [facility], [place] [address], [place], [place] [address]    Report Status 10/27/2020 FINAL  Final   Organism ID, Bacteria STREPTOCOCCUS ANGINOSIS  Final      Susceptibility   Streptococcus anginosis - MIC*    PENICILLIN 0.12 SENSITIVE Sensitive     CEFTRIAXONE 0.5 SENSITIVE Sensitive     ERYTHROMYCIN 4 RESISTANT Resistant     LEVOFLOXACIN 0.5 SENSITIVE Sensitive     VANCOMYCIN 0.5 SENSITIVE Sensitive     * FEW STREPTOCOCCUS ANGINOSIS  Aerobic/Anaerobic Culture w Gram Stain (surgical/deep wound)     Status: None   Collection Time: 10/26/20  5:35 PM   Specimen: PATH Other; Tissue  Result Value Ref Range Status   Specimen Description   Final    FOOT RIGHT Performed at [facility], [address], [place], [place] [address]    Special Requests   Final    NONE Performed at [facility], [address] [place], [place] [address]    Gram Stain NO WBC SEEN NO ORGANISMS SEEN   Final   Culture   Final    RARE STREPTOCOCCUS ANGINOSIS NO ANAEROBES ISOLATED Performed at [facility], [place] [address], [place], [place] [address]    Report Status 10/31/2020 FINAL  Final   Organism ID, Bacteria STREPTOCOCCUS ANGINOSIS  Final      Susceptibility   Streptococcus anginosis - MIC*    PENICILLIN 0.12 SENSITIVE Sensitive     CEFTRIAXONE 0.5 SENSITIVE Sensitive     ERYTHROMYCIN >=8 RESISTANT Resistant     LEVOFLOXACIN 0.5 SENSITIVE Sensitive     VANCOMYCIN 0.5 SENSITIVE Sensitive     *  RARE STREPTOCOCCUS ANGINOSIS    Coagulation Studies: No results for input(s): LABPROT, INR in the last 72 hours.   Urinalysis: No results for input(s): COLORURINE, LABSPEC, PHURINE, GLUCOSEU, HGBUR, BILIRUBINUR, KETONESUR, PROTEINUR, UROBILINOGEN, NITRITE, LEUKOCYTESUR in the last 72 hours.  Invalid input(s): APPERANCEUR    Imaging: No results found.   Medications:  sodium chloride 0 mL/hr at 10/22/20 1200   [START ON 11/07/2020] cefTAZidime (FORTAZ)  IV     cefTAZidime (FORTAZ)  IV     [START ON 11/04/2020] cefTAZidime (FORTAZ)  IV     vancomycin Stopped (11/02/20 1357)    vitamin C  500 mg Oral Daily   aspirin EC  81 mg Oral Daily   calcium carbonate  1 tablet Oral TID with meals   Chlorhexidine Gluconate Cloth  6 each Topical Daily   cholecalciferol  4,000 Units Oral Weekly   cinacalcet  60 mg Oral Daily   ciprofloxacin-dexamethasone  4 drop Both EARS BID   docusate sodium  100 mg Oral Daily   epoetin (EPOGEN/PROCRIT) injection  10,000 Units Intravenous Q T,Th,Sa-HD   feeding supplement (NEPRO CARB STEADY)  237 mL Oral BID BM   ferric citrate  210 mg Oral TID WC   heparin  5,000 Units Subcutaneous Q8H   insulin aspart  0-5 Units Subcutaneous QHS   insulin aspart  0-9 Units Subcutaneous TID WC   insulin aspart  3 Units Subcutaneous TID WC   insulin glargine-yfgn  43 Units Subcutaneous QHS   ipratropium-albuterol  3 mL Nebulization TID   linaclotide  290 mcg Oral q morning   loratadine  10 mg Oral Daily   metroNIDAZOLE  500 mg Oral Q12H   mometasone-formoterol  2 puff Inhalation BID   multivitamin  1 tablet Oral QHS   pantoprazole  40 mg Oral Daily   sodium chloride flush  3 mL Intravenous Q12H   sodium chloride, acetaminophen, albuterol, albuterol, ALPRAZolam, alum & mag hydroxide-simeth, calcium carbonate, dextromethorphan-guaiFENesin, diphenhydrAMINE, hydrALAZINE, HYDROmorphone (DILAUDID) injection, lactulose, metoprolol tartrate, ondansetron (ZOFRAN) IV,  ondansetron, oxyCODONE, oxyCODONE, phenol, polyethylene glycol, sodium chloride flush, traZODone  Assessment/ Plan:  [name] is a 50 y.o. black female with end stage renal disease on hemodialysis, diabetes mellitus type II, hypertension, congestive heart failure, COPD, who is admitted to [facility] on 10/15/2020 for Gangrene [facility]) [I96] Diabetic foot ulcer ([name]) [M57.846, L97.509] Decreased pedal pulses [R09.89] Sepsis, due to unspecified organism, unspecified whether acute organ dysfunction present ([place]) [A41.9]  [facility] TTS Left AVF 127.5kg  End Stage Renal Disease: Received dialysis yesterday, UF goal 3L achieved. Next treatment scheduled for Saturday. Antibiotics prescribed with dialysis and will resume outpatient. Ceftazidime 1g/1g/2g with HD treatment, scheduled to end on 11/23/20.  Hypotension: BP 118/79  Anemia with chronic kidney disease: Hgb 8.1, EPO with dialysis   Secondary Hyperparathyroidism with hyeprphosphatemia: there is risk for calciphylaxis. Outpatient labs on 8/9: phos 10.6, calcium 8.2, PTH 1456.  -Continue Cinacalcet and Renvela. -Calcium improved to 7.1, continue calcium carbonate 1 tab TID   Diabetes mellitus type II with chronic kidney disease: insulin dependent. Complicated with diabetic foot ulcer. History of poor control. Hemoglobin A1c 9.2% on 09/19/2020.  Vascular performed right lower angiogram. 30% mild lesion found popliteal artery. Deemed adequate for wound healing.  Podiatry performed right foot, partial fifth toe amputation on 10/21/20. Dressing changes determine need for further amputation. Patient requesting second opinion.  - Currently receiving Metronidazole and Fortaz. Will continue Fortaz with dialysis. - Appreciate podiatry and vascular input.  - Glucose elevated, SSI managed by primary team    6.    Hyponatremia: Na 125, Will adjust with dialysis    LOS: [place] 9/9/20221:34 PM

## 2020-11-03 NOTE — Progress Notes (Signed)
Pt refused wound care but was educated about its importance. Will notify incoming shift. Will continue to monitor.

## 2020-11-03 NOTE — Progress Notes (Signed)
Inpatient Diabetes Program Recommendations  AACE/ADA: New Consensus Statement on Inpatient Glycemic Control   Target Ranges:  Prepandial:   less than 140 mg/dL      Peak postprandial:   less than 180 mg/dL (1-2 hours)      Critically ill patients:  140 - 180 mg/dL   Results for [name], [name] (MRN [id number]) as of 11/03/2020 12:05  Ref. Range 11/02/2020 07:39 11/02/2020 13:52 11/02/2020 16:51 11/02/2020 22:57 11/03/2020 08:14 11/03/2020 11:47  Glucose-Capillary Latest Ref Range: 70 - 99 mg/dL 230 (H) 142 (H) 213 (H) 284 (H) 208 (H) 190 (H)   Review of Glycemic Control  Diabetes history: DM2 Outpatient Diabetes medications: Tresiba 36 units QHS, Novoog 0-15 units TID with meals, Novolog 0-5 units QHS, Ozempic 1 mg Qweek Current orders for Inpatient glycemic control: Semglee 40 units QHS, Novolog 2 units TID with meals, Novolog 0-9 units TID with meals, Novolog 0-5 units QHS  Inpatient Diabetes Program Recommendations:    Insulin: Please consider increasing Semglee to 43 units QHS and meal coverage to Novolog 3 units TID with meals.  Thanks, Barnie Alderman, RN, MSN, CDE Diabetes Coordinator Inpatient Diabetes Program [phone] (Team Pager from 8am to 5pm)

## 2020-11-03 NOTE — Plan of Care (Signed)
  Problem: Education: Goal: Knowledge of General Education information will improve Description: Including pain rating scale, medication(s)/side effects and non-pharmacologic comfort measures Outcome: Progressing   Problem: Pain Managment: Goal: General experience of comfort will improve Outcome: Progressing   Problem: Safety: Goal: Ability to remain free from injury will improve Outcome: Progressing   

## 2020-11-04 DIAGNOSIS — Z992 Dependence on renal dialysis: Secondary | ICD-10-CM | POA: Diagnosis not present

## 2020-11-04 DIAGNOSIS — T8743 Infection of amputation stump, right lower extremity: Secondary | ICD-10-CM | POA: Diagnosis not present

## 2020-11-04 DIAGNOSIS — N186 End stage renal disease: Secondary | ICD-10-CM | POA: Diagnosis not present

## 2020-11-04 DIAGNOSIS — L97514 Non-pressure chronic ulcer of other part of right foot with necrosis of bone: Secondary | ICD-10-CM | POA: Diagnosis not present

## 2020-11-04 DIAGNOSIS — E11621 Type 2 diabetes mellitus with foot ulcer: Secondary | ICD-10-CM | POA: Diagnosis not present

## 2020-11-04 LAB — GLUCOSE, CAPILLARY
Glucose-Capillary: 152 mg/dL — ABNORMAL HIGH (ref 70–99)
Glucose-Capillary: 187 mg/dL — ABNORMAL HIGH (ref 70–99)
Glucose-Capillary: 241 mg/dL — ABNORMAL HIGH (ref 70–99)
Glucose-Capillary: 189 mg/dL — ABNORMAL HIGH (ref 70–99)

## 2020-11-04 MED ORDER — EPOETIN ALFA 10000 UNIT/ML IJ SOLN
INTRAMUSCULAR | Status: AC
  Filled 2020-11-04: qty 1

## 2020-11-04 NOTE — Progress Notes (Signed)
PROGRESS NOTE    [name]  ZOX:[id number] DOB: [birth year] DOA: 10/15/2020 PCP: [name], NP    Brief Narrative:  Patient admitted 15 days ago with diabetic foot ulcers with gangrene of the fifth toe and osteomyelitis.  Past medical history chronic diastolic congestive heart failure, chronic hypoxic respiratory failure on 3 to 4 L of oxygen, end-stage renal disease with diabetes, morbid obesity.  On 10/21/2020 had amputation of the fifth ray and I&D of abscess on the right foot.  On 10/26/2020 had washout procedure.  The patient initially was on IV Zosyn.  Switched over to ceftazidime with dosing with dialysis at this point.  Specialist had recommended amputation but patient is not ready to make that decision at this point in time.   Assessment & Plan:   Principal Problem:   Diabetic foot ulcer ([place]) Active Problems:   Diabetes ([place])   Obesity, Class III, BMI 40-49.9 (morbid obesity) ([place])   Essential hypertension   Type 2 diabetes mellitus with ESRD (end-stage renal disease) ([place])   DNR (do not resuscitate) discussion   End-stage renal disease on hemodialysis ([place])   Chronic respiratory failure with hypoxia (HCC)   Decreased pedal pulses   Gangrene (HCC)   PVD (peripheral vascular disease) (HCC)   Aspiration into airway   Hearing loss of right ear   Hyponatremia   Anemia of chronic disease  Diabetic fold associate with gangrene of the fifth toe associated with osteomyelitis Status post amputation of fifth ray and I&D on 8/27 Washout procedure on 10/26/2020 ID and podiatry following Reaffirmed with patient, she is declining amputation at this time Encouraged her not to delay her decision and seek out surgical evaluation when she is ready to make the decision Plan: IV vancomycin and ceftazidime with dialysis Will need 4 weeks of therapy Appreciate ID follow-up Outpatient antibiotic regimen in place.   Patient is medically stable for discharge at this time  ESRD on  HD Nephrology consulted TTS dialysis schedule Follow nephrology recommendation for inpatient HD needs Patient will need a facility to accommodate antibiotics with dialysis Plan: Dialysis today  Diabetes mellitus type 2, uncontrolled Semglee 43 units nightly NovoLog 3 units 3 times daily with meals NovoLog and sliding scale Carb modified diet  Anemia of chronic kidney disease Stable, will monitor  Peripheral vascular disease Continue aspirin  Hyponatremia Managed with hemodialysis  Chronic hypoxic respiratory failure At baseline, 3 to 4 L  Morbid obesity This complicates overall care and prognosis  Weakness Functional decline Will need skilled nursing facility at time of discharge      DVT prophylaxis: SQ Lovenox Code Status: Full Family Communication: None today Disposition Plan: Status is: Inpatient  Remains inpatient appropriate because:Unsafe d/c plan  Dispo: The patient is from: Home              Anticipated d/c is to: SNF              Patient currently is medically stable to d/c.   Difficult to place patient No  Patient is refusing amputation as recommended by our surgical teams.  As such she is medically stable for discharge at this time.  She will be on 4 weeks of antibiotics.  TOC aware, bed search initiated.  Patient can discharge once SNF bed is found.   Level of care: Med-Surg  Consultants:  ID Podiatry Nephrology  Procedures:  8/24 angiogram right lower extremity 8/27 amputation of the fifth ray and I&D of abscess of the right foot  9/1 washout procedure  Antimicrobials:  Vancomycin Ceftazidime   Subjective: Patient seen and examined.  Husband at bedside.  No visible distress.  Answers all questions appropriately.  Pain well controlled.  Objective: Vitals:   11/04/20 1015 11/04/20 1030 11/04/20 1045 11/04/20 1100  BP: 132/80 138/84 140/74 135/89  Pulse: 95 94 96 97  Resp: 19 18 18 15   Temp:      TempSrc:      SpO2: 96% 100%  96% 95%  Weight:      Height:        Intake/Output Summary (Last 24 hours) at 11/04/2020 1111 Last data filed at 11/04/2020 1038 Gross per 24 hour  Intake 240 ml  Output --  Net 240 ml   Filed Weights   10/15/20 1905 10/18/20 0928  Weight: 126.1 kg 126.1 kg    Examination:  General exam: Appears calm and comfortable  Respiratory system: Clear to auscultation. Respiratory effort normal. Cardiovascular system: S1-S2, RRR, no murmurs, no pedal edema Gastrointestinal system: Obese, nontender, nondistended, normal bowel sounds Central nervous system: Alert and oriented. No focal neurological deficits. Extremities: Decreased power bilateral lower extremities Skin: Lower leg with skin darkening, significant nonpitting edema.  Right foot with significant wounds Psychiatry: Judgement and insight appear normal. Mood & affect appropriate.     Data Reviewed: I have personally reviewed following labs and imaging studies  CBC: Recent Labs  Lab 10/29/20 0523 10/31/20 0408 11/03/20 0838  WBC 19.8* 15.3* 13.5*  NEUTROABS  --  12.6*  --   HGB 8.1* 7.5* 8.1*  HCT 25.3* 23.4* 25.4*  MCV 95.5 95.5 94.4  PLT 341 296 993   Basic Metabolic Panel: Recent Labs  Lab 10/29/20 0523 10/31/20 0408 11/03/20 0838  NA 129* 127* 125*  K 3.8 3.8 3.6  CL 91* 90* 87*  CO2 23 23 22   GLUCOSE 277* 266* 198*  BUN 43* 48* 46*  CREATININE 7.04* 7.53* 7.30*  CALCIUM 6.9* 6.4* 7.1*  PHOS 5.6*  --  4.3   GFR: Estimated Creatinine Clearance: 12.4 mL/min (A) (by C-G formula based on SCr of 7.3 mg/dL (H)). Liver Function Tests: Recent Labs  Lab 11/03/20 0838  ALBUMIN 2.4*   No results for input(s): LIPASE, AMYLASE in the last 168 hours. No results for input(s): AMMONIA in the last 168 hours. Coagulation Profile: No results for input(s): INR, PROTIME in the last 168 hours.  Cardiac Enzymes: No results for input(s): CKTOTAL, CKMB, CKMBINDEX, TROPONINI in the last 168 hours. BNP (last 3  results) No results for input(s): PROBNP in the last 8760 hours. HbA1C: No results for input(s): HGBA1C in the last 72 hours. CBG: Recent Labs  Lab 11/03/20 0814 11/03/20 1147 11/03/20 1559 11/03/20 2044 11/04/20 0804  GLUCAP 208* 190* 212* 171* 152*   Lipid Profile: No results for input(s): CHOL, HDL, LDLCALC, TRIG, CHOLHDL, LDLDIRECT in the last 72 hours. Thyroid Function Tests: No results for input(s): TSH, T4TOTAL, FREET4, T3FREE, THYROIDAB in the last 72 hours. Anemia Panel: No results for input(s): VITAMINB12, FOLATE, FERRITIN, TIBC, IRON, RETICCTPCT in the last 72 hours. Sepsis Labs: No results for input(s): PROCALCITON, LATICACIDVEN in the last 168 hours.  Recent Results (from the past 240 hour(s))  Aerobic/Anaerobic Culture w Gram Stain (surgical/deep wound)     Status: None   Collection Time: 10/26/20  5:35 PM   Specimen: PATH Other; Tissue  Result Value Ref Range Status   Specimen Description   Final    FOOT RIGHT Performed at [facility], [address] [place], [place] [address]    Special Requests   Final    NONE Performed at [facility], [place], [place] [address]    Gram Stain NO WBC SEEN NO ORGANISMS SEEN   Final   Culture   Final    RARE STREPTOCOCCUS ANGINOSIS NO ANAEROBES ISOLATED Performed at [facility], [place] [address] [place], [place] [address]    Report Status 10/31/2020 FINAL  Final   Organism ID, Bacteria STREPTOCOCCUS ANGINOSIS  Final      Susceptibility   Streptococcus anginosis - MIC*    PENICILLIN 0.12 SENSITIVE Sensitive     CEFTRIAXONE 0.5 SENSITIVE Sensitive     ERYTHROMYCIN >=8 RESISTANT Resistant     LEVOFLOXACIN 0.5 SENSITIVE Sensitive     VANCOMYCIN 0.5 SENSITIVE Sensitive     * RARE STREPTOCOCCUS ANGINOSIS         Radiology Studies: No results found.      Scheduled Meds:  vitamin C  500 mg Oral Daily   aspirin EC  81 mg Oral Daily   calcium carbonate  1 tablet  Oral TID with meals   Chlorhexidine Gluconate Cloth  6 each Topical Daily   cholecalciferol  4,000 Units Oral Weekly   cinacalcet  60 mg Oral Daily   ciprofloxacin-dexamethasone  4 drop Both EARS BID   docusate sodium  100 mg Oral Daily   epoetin (EPOGEN/PROCRIT) injection  10,000 Units Intravenous Q T,Th,Sa-HD   feeding supplement (NEPRO CARB STEADY)  237 mL Oral BID BM   ferric citrate  210 mg Oral TID WC   heparin  5,000 Units Subcutaneous Q8H   insulin aspart  0-5 Units Subcutaneous QHS   insulin aspart  0-9 Units Subcutaneous TID WC   insulin aspart  3 Units Subcutaneous TID WC   insulin glargine-yfgn  43 Units Subcutaneous QHS   ipratropium-albuterol  3 mL Nebulization TID   linaclotide  290 mcg Oral q morning   loratadine  10 mg Oral Daily   metroNIDAZOLE  500 mg Oral Q12H   mometasone-formoterol  2 puff Inhalation BID   multivitamin  1 tablet Oral QHS   pantoprazole  40 mg Oral Daily   sodium chloride flush  3 mL Intravenous Q12H   Continuous Infusions:  sodium chloride 0 mL/hr at 10/22/20 1200   [START ON 11/07/2020] cefTAZidime (FORTAZ)  IV     cefTAZidime (FORTAZ)  IV     cefTAZidime (FORTAZ)  IV     vancomycin Stopped (11/02/20 1357)     LOS: 20 days    Time spent: 15 minutes    Sidney Ace, MD [facility] Pager [phone]  If 7PM-7AM, please contact night-coverage  11/04/2020, 11:11 AM

## 2020-11-04 NOTE — Progress Notes (Signed)
[facility]  ROUNDING NOTE   Subjective:   [name] was admitted to [facility] on 10/15/2020 for Gangrene [facility]) [I96] Diabetic foot ulcer ([place]) [E11.621, L97.509] Decreased pedal pulses [R09.89] Sepsis, due to unspecified organism, unspecified whether acute organ dysfunction present [facility]) [A41.9]  Patient was seen today on dialysis Patient offers no new specific physical complaints Patient was tolerating treatment well   Objective:  Vital signs in last 24 hours:  Temp:  [97.7 F (36.5 C)-99.4 F (37.4 C)] 97.7 F (36.5 C) (09/10 1004) Pulse Rate:  [93-105] 96 (09/10 1045) Resp:  [15-19] 18 (09/10 1045) BP: (118-148)/(70-84) 140/74 (09/10 1045) SpO2:  [91 %-100 %] 96 % (09/10 1045)  Weight change:  Filed Weights   10/15/20 1905 10/18/20 0928  Weight: 126.1 kg 126.1 kg    Intake/Output: No intake/output data recorded.   Intake/Output this shift:  Total I/O In: 240 [P.O.:240] Out: -   Physical Exam: General: NAD,resting in bed  Head: Normocephalic, atraumatic. Moist oral mucosal membranes  Eyes: Anicteric  Lungs:  Clear to auscultation, O2 2L  Heart: Regular rate and rhythm  Abdomen:  Soft, nontender  Extremities: trace peripheral edema. Right foot fifth toe amputation on 10/21/20, ace wrapped dressing  Neurologic: Alert, moving all four extremities  Skin: No lesions  Access: Left AVF-2 needles in situ    Basic Metabolic Panel: Recent Labs  Lab 10/29/20 0523 10/31/20 0408 11/03/20 0838  NA 129* 127* 125*  K 3.8 3.8 3.6  CL 91* 90* 87*  CO2 23 23 22   GLUCOSE 277* 266* 198*  BUN 43* 48* 46*  CREATININE 7.04* 7.53* 7.30*  CALCIUM 6.9* 6.4* 7.1*  PHOS 5.6*  --  4.3    Liver Function Tests: Recent Labs  Lab 11/03/20 0838  ALBUMIN 2.4*   No results for input(s): LIPASE, AMYLASE in the last 168 hours. No results for input(s): AMMONIA in the last 168 hours.  CBC: Recent Labs  Lab 10/29/20 0523 10/31/20 0408 11/03/20 0838  WBC  19.8* 15.3* 13.5*  NEUTROABS  --  12.6*  --   HGB 8.1* 7.5* 8.1*  HCT 25.3* 23.4* 25.4*  MCV 95.5 95.5 94.4  PLT 341 296 309    Cardiac Enzymes: No results for input(s): CKTOTAL, CKMB, CKMBINDEX, TROPONINI in the last 168 hours.  BNP: Invalid input(s): POCBNP  CBG: Recent Labs  Lab 11/03/20 0814 11/03/20 1147 11/03/20 1559 11/03/20 2044 11/04/20 0804  GLUCAP 208* 190* 212* 171* 152*    Microbiology: Results for orders placed or performed during the hospital encounter of 10/15/20  Blood Culture (routine x 2)     Status: None   Collection Time: 10/15/20  8:03 PM   Specimen: BLOOD  Result Value Ref Range Status   Specimen Description BLOOD BLOOD RIGHT FOREARM  Final   Special Requests   Final    BOTTLES DRAWN AEROBIC AND ANAEROBIC Blood Culture adequate volume   Culture   Final    NO GROWTH 5 DAYS Performed at [facility], [address] [place], [place] [address]    Report Status 10/20/2020 FINAL  Final  Resp Panel by RT-PCR (Flu A&B, Covid) Nasopharyngeal Swab     Status: None   Collection Time: 10/15/20  8:26 PM   Specimen: Nasopharyngeal Swab; Nasopharyngeal(NP) swabs in vial transport medium  Result Value Ref Range Status   SARS Coronavirus 2 by RT PCR NEGATIVE NEGATIVE Final    Comment: (NOTE) SARS-CoV-2 target nucleic acids are NOT DETECTED.  The SARS-CoV-2  RNA is generally detectable in upper respiratory specimens during the acute phase of infection. The lowest concentration of SARS-CoV-2 viral copies this assay can detect is 138 copies/mL. A negative result does not preclude SARS-Cov-2 infection and should not be used as the sole basis for treatment or other patient management decisions. A negative result may occur with  improper specimen collection/handling, submission of specimen other than nasopharyngeal swab, presence of viral mutation(s) within the areas targeted by this assay, and inadequate number of viral copies(<138 copies/mL). A  negative result must be combined with clinical observations, patient history, and epidemiological information. The expected result is Negative.  Fact Sheet for Patients:  EntrepreneurPulse.com.au  Fact Sheet for Healthcare Providers:  IncredibleEmployment.be  This test is no t yet approved or cleared by the [place] FDA and  has been authorized for detection and/or diagnosis of SARS-CoV-2 by FDA under an Emergency Use Authorization (EUA). This EUA will remain  in effect (meaning this test can be used) for the duration of the COVID-19 declaration under Section 564(b)(1) of the Act, 21 U.S.C.section 360bbb-3(b)(1), unless the authorization is terminated  or revoked sooner.       Influenza A by PCR NEGATIVE NEGATIVE Final   Influenza B by PCR NEGATIVE NEGATIVE Final    Comment: (NOTE) The Xpert Xpress SARS-CoV-2/FLU/RSV plus assay is intended as an aid in the diagnosis of influenza from Nasopharyngeal swab specimens and should not be used as a sole basis for treatment. Nasal washings and aspirates are unacceptable for Xpert Xpress SARS-CoV-2/FLU/RSV testing.  Fact Sheet for Patients: EntrepreneurPulse.com.au  Fact Sheet for Healthcare Providers: IncredibleEmployment.be  This test is not yet approved or cleared by the [place] FDA and has been authorized for detection and/or diagnosis of SARS-CoV-2 by FDA under an Emergency Use Authorization (EUA). This EUA will remain in effect (meaning this test can be used) for the duration of the COVID-19 declaration under Section 564(b)(1) of the Act, 21 U.S.C. section 360bbb-3(b)(1), unless the authorization is terminated or revoked.  Performed at [facility], [address] [place], [place] [address]   Blood Culture (routine x 2)     Status: None   Collection Time: 10/15/20  8:26 PM   Specimen: BLOOD  Result Value Ref Range Status    Specimen Description BLOOD RIGHT ANTECUBITAL  Final   Special Requests   Final    BOTTLES DRAWN AEROBIC AND ANAEROBIC Blood Culture adequate volume   Culture   Final    NO GROWTH 5 DAYS Performed at [facility], [address] [place], [place] [address]    Report Status 10/20/2020 FINAL  Final  Aerobic Culture w Gram Stain (superficial specimen)     Status: None   Collection Time: 10/17/20  8:52 PM   Specimen: Wound  Result Value Ref Range Status   Specimen Description WOUND  Final   Special Requests NONE  Final   Gram Stain   Final    RARE SQUAMOUS EPITHELIAL CELLS PRESENT FEW WBC PRESENT, PREDOMINANTLY MONONUCLEAR ABUNDANT GRAM VARIABLE ROD FEW GRAM POSITIVE COCCI    Culture   Final    MODERATE ENTEROCOCCUS FAECALIS MODERATE KLEBSIELLA OXYTOCA ABUNDANT DIPHTHEROIDS(CORYNEBACTERIUM SPECIES) Standardized susceptibility testing for this organism is not available.    Report Status 10/20/2020 FINAL  Final   Organism ID, Bacteria ENTEROCOCCUS FAECALIS  Final   Organism ID, Bacteria KLEBSIELLA OXYTOCA  Final      Susceptibility   Enterococcus faecalis - MIC*    AMPICILLIN <=2 SENSITIVE Sensitive  VANCOMYCIN 1 SENSITIVE Sensitive     GENTAMICIN SYNERGY RESISTANT Resistant     * MODERATE ENTEROCOCCUS FAECALIS   Klebsiella oxytoca - MIC*    AMPICILLIN >=32 RESISTANT Resistant     CEFAZOLIN <=4 SENSITIVE Sensitive     CEFEPIME <=0.12 SENSITIVE Sensitive     CEFTAZIDIME <=1 SENSITIVE Sensitive     CEFTRIAXONE <=0.25 SENSITIVE Sensitive     CIPROFLOXACIN <=0.25 SENSITIVE Sensitive     GENTAMICIN <=1 SENSITIVE Sensitive     IMIPENEM <=0.25 SENSITIVE Sensitive     TRIMETH/SULFA <=20 SENSITIVE Sensitive     AMPICILLIN/SULBACTAM 8 SENSITIVE Sensitive     PIP/TAZO <=4 SENSITIVE Sensitive     * MODERATE KLEBSIELLA OXYTOCA  Aerobic/Anaerobic Culture w Gram Stain (surgical/deep wound)     Status: None   Collection Time: 10/21/20  3:07 PM   Specimen: PATH Digit  amputation; Tissue  Result Value Ref Range Status   Specimen Description   Final    FOOT  RIGHT 5TH DIGIT AMPUTATION Performed at [facility], [address], [place], [place] [address]    Special Requests   Final    NONE Performed at [facility], [place], [place] [address]    Gram Stain   Final    FEW SQUAMOUS EPITHELIAL CELLS PRESENT FEW WBC SEEN FEW GRAM POSITIVE COCCI    Culture   Final    FEW STREPTOCOCCUS ANGINOSIS NO ANAEROBES ISOLATED Performed at [facility], [place] [address] [place], [place] [address]    Report Status 10/27/2020 FINAL  Final   Organism ID, Bacteria STREPTOCOCCUS ANGINOSIS  Final      Susceptibility   Streptococcus anginosis - MIC*    PENICILLIN 0.12 SENSITIVE Sensitive     CEFTRIAXONE 0.5 SENSITIVE Sensitive     ERYTHROMYCIN 4 RESISTANT Resistant     LEVOFLOXACIN 0.5 SENSITIVE Sensitive     VANCOMYCIN 0.5 SENSITIVE Sensitive     * FEW STREPTOCOCCUS ANGINOSIS  Aerobic/Anaerobic Culture w Gram Stain (surgical/deep wound)     Status: None   Collection Time: 10/26/20  5:35 PM   Specimen: PATH Other; Tissue  Result Value Ref Range Status   Specimen Description   Final    FOOT RIGHT Performed at [facility], [address] [place], [place] [address]    Special Requests   Final    NONE Performed at [facility], [address] [place], [place] [address]    Gram Stain NO WBC SEEN NO ORGANISMS SEEN   Final   Culture   Final    RARE STREPTOCOCCUS ANGINOSIS NO ANAEROBES ISOLATED Performed at [facility], [place] [address], [place], [place] [address]    Report Status 10/31/2020 FINAL  Final   Organism ID, Bacteria STREPTOCOCCUS ANGINOSIS  Final      Susceptibility   Streptococcus anginosis - MIC*    PENICILLIN 0.12 SENSITIVE Sensitive     CEFTRIAXONE 0.5 SENSITIVE Sensitive     ERYTHROMYCIN >=8 RESISTANT Resistant     LEVOFLOXACIN 0.5 SENSITIVE Sensitive     VANCOMYCIN 0.5  SENSITIVE Sensitive     * RARE STREPTOCOCCUS ANGINOSIS    Coagulation Studies: No results for input(s): LABPROT, INR in the last 72 hours.   Urinalysis: No results for input(s): COLORURINE, LABSPEC, PHURINE, GLUCOSEU, HGBUR, BILIRUBINUR, KETONESUR, PROTEINUR, UROBILINOGEN, NITRITE, LEUKOCYTESUR in the last 72 hours.  Invalid input(s): APPERANCEUR    Imaging: No results found.   Medications:    sodium chloride 0 mL/hr  at 10/22/20 1200   [START ON 11/07/2020] cefTAZidime (FORTAZ)  IV     cefTAZidime (FORTAZ)  IV     cefTAZidime (FORTAZ)  IV     vancomycin Stopped (11/02/20 1357)    vitamin C  500 mg Oral Daily   aspirin EC  81 mg Oral Daily   calcium carbonate  1 tablet Oral TID with meals   Chlorhexidine Gluconate Cloth  6 each Topical Daily   cholecalciferol  4,000 Units Oral Weekly   cinacalcet  60 mg Oral Daily   ciprofloxacin-dexamethasone  4 drop Both EARS BID   docusate sodium  100 mg Oral Daily   epoetin (EPOGEN/PROCRIT) injection  10,000 Units Intravenous Q T,Th,Sa-HD   feeding supplement (NEPRO CARB STEADY)  237 mL Oral BID BM   ferric citrate  210 mg Oral TID WC   heparin  5,000 Units Subcutaneous Q8H   insulin aspart  0-5 Units Subcutaneous QHS   insulin aspart  0-9 Units Subcutaneous TID WC   insulin aspart  3 Units Subcutaneous TID WC   insulin glargine-yfgn  43 Units Subcutaneous QHS   ipratropium-albuterol  3 mL Nebulization TID   linaclotide  290 mcg Oral q morning   loratadine  10 mg Oral Daily   metroNIDAZOLE  500 mg Oral Q12H   mometasone-formoterol  2 puff Inhalation BID   multivitamin  1 tablet Oral QHS   pantoprazole  40 mg Oral Daily   sodium chloride flush  3 mL Intravenous Q12H   sodium chloride, acetaminophen, albuterol, albuterol, ALPRAZolam, alum & mag hydroxide-simeth, calcium carbonate, dextromethorphan-guaiFENesin, diphenhydrAMINE, hydrALAZINE, HYDROmorphone (DILAUDID) injection, lactulose, metoprolol tartrate, ondansetron (ZOFRAN) IV,  ondansetron, oxyCODONE, oxyCODONE, phenol, polyethylene glycol, sodium chloride flush, traZODone  Assessment/ Plan:  [name] is a 50 y.o. black female with end stage renal disease on hemodialysis, diabetes mellitus type II, hypertension, congestive heart failure, COPD, who is admitted to [facility] on 10/15/2020 for Gangrene [facility]) [I96] Diabetic foot ulcer ([place]) [D66.440, L97.509] Decreased pedal pulses [R09.89] Sepsis, due to unspecified organism, unspecified whether acute organ dysfunction present [facility]) [A41.9]  [facility] TTS Left AVF 127.5kg   Impression  1)Renal    End-stage renal disease Patient is on hemodialysis Patient is on Tuesday Thursday Saturday schedule Patient is currently being dialyzed  2)HTN    Blood pressure is stable    3)Anemia of chronic disease  CBC Latest Ref Rng & Units 11/03/2020 10/31/2020 10/29/2020  WBC 4.0 - 10.5 K/uL 13.5(H) 15.3(H) 19.8(H)  Hemoglobin 12.0 - 15.0 g/dL 8.1(L) 7.5(L) 8.1(L)  Hematocrit 36.0 - 46.0 % 25.4(L) 23.4(L) 25.3(L)  Platelets 150 - 400 K/uL 309 296 341       HGb is not at goal (9--11) Patient is on Epogen Patient hemoglobin is improving  4) Secondary hyperparathyroidism -CKD Mineral-Bone Disorder    Lab Results  Component Value Date   PTH 134 (H) 04/21/2017   CALCIUM 7.1 (L) 11/03/2020   CAION 0.81 (LL) 10/26/2020   PHOS 4.3 11/03/2020    Secondary Hyperparathyroidism present Phosphorus at goal.   5)Diabetes mellitus type II with chronic kidney disease: insulin dependent.  Patient diabetes is complicated with diabetic foot ulcer.  Patient has history of poor control. Hemoglobin A1c 9.2% on 09/19/2020.  Vascular performed right lower angiogram. 30% mild lesion found popliteal artery.  It was deemed adequate for wound healing.  Podiatry performed right foot, partial fifth toe amputation on 10/21/20. Dressing changes determine need for further amputation. Patient requesting second opinion. Patient is  currently on IV antibiotics Patient is being followed by podiatry vascular the primary team    6) Electrolytes   BMP Latest Ref Rng & Units 11/03/2020 10/31/2020 10/29/2020  Glucose 70 - 99 mg/dL 198(H) 266(H) 277(H)  BUN 6 - 20 mg/dL 46(H) 48(H) 43(H)  Creatinine 0.44 - 1.00 mg/dL 7.30(H) 7.53(H) 7.04(H)  Sodium 135 - 145 mmol/L 125(L) 127(L) 129(L)  Potassium 3.5 - 5.1 mmol/L 3.6 3.8 3.8  Chloride 98 - 111 mmol/L 87(L) 90(L) 91(L)  CO2 22 - 32 mmol/L 22 23 23   Calcium 8.9 - 10.3 mg/dL 7.1(L) 6.4(LL) 6.9(L)     Sodium Hyponatremia Secondary to ESRD Should improve with dialysis   Potassium Normokalemic    7)Acid base  Co2 at goal    Plan   We will continue current treatment   LOS: 20 [name] s [facility] 9/10/202211:06 AM

## 2020-11-04 NOTE — Progress Notes (Signed)
Pt ended HD tx 11 minutes early d/t need for bathroom. Refused bedpan in hd unit. Received HD medications. Removed 3L with HD, did not meet uf goal. Alert, stable, report to primary RN and ccmd notified of pt departure from unit. AVF positive for thrill and bruit post HD.

## 2020-11-04 NOTE — Progress Notes (Signed)
Pre HD RN assessment 

## 2020-11-04 NOTE — TOC Progression Note (Signed)
Transition of Care [facility]) - Progression Note    Patient Details  Name: [name] MRN: [id number] Date of Birth: 1970/08/14  Transition of Care [facility]) CM/SW Contact  Izola Price, RN Phone Number: 11/04/2020, 3:41 PM  Clinical Narrative:   9/10: Refusing BKA. Starting on IV Vancomycin today, will need for 4 weeks. Chronic oxygen 3-4 L/[place]. Pending bed search results. Simmie Davies RN CM     Expected Discharge Plan: Home/Self Care Barriers to Discharge: Continued Medical Work up  Expected Discharge Plan and Services Expected Discharge Plan: Home/Self Care   Discharge Planning Services: CM Consult   Living arrangements for the past 2 months: Single Family Home                 DME Arranged: [place] rolling DME Agency: [employer] Date DME Agency Contacted: 10/19/20 Time DME Agency Contacted: [phone] Representative spoke with at DME Agency: [place] [facility]    Readmission Risk Interventions No flowsheet data found.

## 2020-11-05 DIAGNOSIS — L97514 Non-pressure chronic ulcer of other part of right foot with necrosis of bone: Secondary | ICD-10-CM | POA: Diagnosis not present

## 2020-11-05 DIAGNOSIS — E11621 Type 2 diabetes mellitus with foot ulcer: Secondary | ICD-10-CM | POA: Diagnosis not present

## 2020-11-05 LAB — GLUCOSE, CAPILLARY
Glucose-Capillary: 132 mg/dL — ABNORMAL HIGH (ref 70–99)
Glucose-Capillary: 151 mg/dL — ABNORMAL HIGH (ref 70–99)
Glucose-Capillary: 159 mg/dL — ABNORMAL HIGH (ref 70–99)
Glucose-Capillary: 256 mg/dL — ABNORMAL HIGH (ref 70–99)

## 2020-11-05 NOTE — Progress Notes (Signed)
Patient refusing dressing change at this time.

## 2020-11-05 NOTE — TOC Progression Note (Signed)
Transition of Care [facility]) - Progression Note    Patient Details  Name: CHAREESE SERGENT MRN: [id number] Date of Birth: Jul 03, 1970  Transition of Care [facility]) CM/SW Contact  Izola Price, RN Phone Number: 11/05/2020, 9:19 AM  Clinical Narrative:  11/05/20: RN progress note indicated patient refused dressing change. Bed search pending a facility that can accommodate medical, wound care, dressing changes, IV antibiotics for osteomyelitis 5th toe, dialysis, co-morbidities. Simmie Davies RN CM      Expected Discharge Plan: Home/Self Care Barriers to Discharge: SNF Pending bed offer (SNF bed search that can accomodate medical needs, wound care, and IV abx's. Bed search pending offers.)  Expected Discharge Plan and Services Expected Discharge Plan: Home/Self Care   Discharge Planning Services: CM Consult   Living arrangements for the past 2 months: Single Family Home                 DME Arranged: [place] rolling DME Agency: [employer] Date DME Agency Contacted: 10/19/20 Time DME Agency Contacted: [phone] Representative spoke with at DME Agency: [place] [facility]    Readmission Risk Interventions No flowsheet data found.

## 2020-11-05 NOTE — Progress Notes (Signed)
[facility]  ROUNDING NOTE   Subjective:   [name] was admitted to [facility] on 10/15/2020 for Gangrene [facility]) [I96] Diabetic foot ulcer ([place]) [E11.621, L97.509] Decreased pedal pulses [R09.89] Sepsis, due to unspecified organism, unspecified whether acute organ dysfunction present [facility]) [A41.9]  Patient offers no new specific complaints. Patient was seen today on first floor    Objective:  Vital signs in last 24 hours:  Temp:  [97.5 F (36.4 C)-98.6 F (37 C)] 97.9 F (36.6 C) (09/11 0828) Pulse Rate:  [93-106] 94 (09/11 0900) Resp:  [14-19] 16 (09/11 0900) BP: (107-157)/(41-98) 136/98 (09/11 0828) SpO2:  [95 %-100 %] 100 % (09/11 0828)  Weight change:  Filed Weights   10/15/20 1905 10/18/20 0928  Weight: 126.1 kg 126.1 kg    Intake/Output: I/O last 3 completed shifts: In: 720 [P.O.:720] Out: 2961 [Other:2961]   Intake/Output this shift:  No intake/output data recorded.  Physical Exam: General: NAD,resting in bed  Head: Normocephalic, atraumatic. Moist oral mucosal membranes  Eyes: Anicteric  Lungs:  Clear to auscultation, O2 2L  Heart: Regular rate and rhythm  Abdomen:  Soft, nontender  Extremities: trace peripheral edema. Right foot fifth toe amputation on 10/21/20, ace wrapped dressing  Neurologic: Alert, moving all four extremities  Skin: No lesions  Access: Left AVF    Basic Metabolic Panel: Recent Labs  Lab 10/31/20 0408 11/03/20 0838  NA 127* 125*  K 3.8 3.6  CL 90* 87*  CO2 23 22  GLUCOSE 266* 198*  BUN 48* 46*  CREATININE 7.53* 7.30*  CALCIUM 6.4* 7.1*  PHOS  --  4.3    Liver Function Tests: Recent Labs  Lab 11/03/20 0838  ALBUMIN 2.4*   No results for input(s): LIPASE, AMYLASE in the last 168 hours. No results for input(s): AMMONIA in the last 168 hours.  CBC: Recent Labs  Lab 10/31/20 0408 11/03/20 0838  WBC 15.3* 13.5*  NEUTROABS 12.6*  --   HGB 7.5* 8.1*  HCT 23.4* 25.4*  MCV 95.5 94.4  PLT 296  309    Cardiac Enzymes: No results for input(s): CKTOTAL, CKMB, CKMBINDEX, TROPONINI in the last 168 hours.  BNP: Invalid input(s): POCBNP  CBG: Recent Labs  Lab 11/04/20 0804 11/04/20 1423 11/04/20 1623 11/04/20 2039 11/05/20 0807  GLUCAP 152* 187* 241* 189* 132*    Microbiology: Results for orders placed or performed during the hospital encounter of 10/15/20  Blood Culture (routine x 2)     Status: None   Collection Time: 10/15/20  8:03 PM   Specimen: BLOOD  Result Value Ref Range Status   Specimen Description BLOOD BLOOD RIGHT FOREARM  Final   Special Requests   Final    BOTTLES DRAWN AEROBIC AND ANAEROBIC Blood Culture adequate volume   Culture   Final    NO GROWTH 5 DAYS Performed at [facility], [address] [place], [place] [address]    Report Status 10/20/2020 FINAL  Final  Resp Panel by RT-PCR (Flu A&B, Covid) Nasopharyngeal Swab     Status: None   Collection Time: 10/15/20  8:26 PM   Specimen: Nasopharyngeal Swab; Nasopharyngeal(NP) swabs in vial transport medium  Result Value Ref Range Status   SARS Coronavirus 2 by RT PCR NEGATIVE NEGATIVE Final    Comment: (NOTE) SARS-CoV-2 target nucleic acids are NOT DETECTED.  The SARS-CoV-2 RNA is generally detectable in upper respiratory specimens during the acute phase of infection. The lowest concentration of SARS-CoV-2 viral copies this assay can detect  is 138 copies/mL. A negative result does not preclude SARS-Cov-2 infection and should not be used as the sole basis for treatment or other patient management decisions. A negative result may occur with  improper specimen collection/handling, submission of specimen other than nasopharyngeal swab, presence of viral mutation(s) within the areas targeted by this assay, and inadequate number of viral copies(<138 copies/mL). A negative result must be combined with clinical observations, patient history, and epidemiological information. The expected  result is Negative.  Fact Sheet for Patients:  EntrepreneurPulse.com.au  Fact Sheet for Healthcare Providers:  IncredibleEmployment.be  This test is no t yet approved or cleared by the [place] FDA and  has been authorized for detection and/or diagnosis of SARS-CoV-2 by FDA under an Emergency Use Authorization (EUA). This EUA will remain  in effect (meaning this test can be used) for the duration of the COVID-19 declaration under Section 564(b)(1) of the Act, 21 U.S.C.section 360bbb-3(b)(1), unless the authorization is terminated  or revoked sooner.       Influenza A by PCR NEGATIVE NEGATIVE Final   Influenza B by PCR NEGATIVE NEGATIVE Final    Comment: (NOTE) The Xpert Xpress SARS-CoV-2/FLU/RSV plus assay is intended as an aid in the diagnosis of influenza from Nasopharyngeal swab specimens and should not be used as a sole basis for treatment. Nasal washings and aspirates are unacceptable for Xpert Xpress SARS-CoV-2/FLU/RSV testing.  Fact Sheet for Patients: EntrepreneurPulse.com.au  Fact Sheet for Healthcare Providers: IncredibleEmployment.be  This test is not yet approved or cleared by the [place] FDA and has been authorized for detection and/or diagnosis of SARS-CoV-2 by FDA under an Emergency Use Authorization (EUA). This EUA will remain in effect (meaning this test can be used) for the duration of the COVID-19 declaration under Section 564(b)(1) of the Act, 21 U.S.C. section 360bbb-3(b)(1), unless the authorization is terminated or revoked.  Performed at [facility], [address] [place], [place] [address]   Blood Culture (routine x 2)     Status: None   Collection Time: 10/15/20  8:26 PM   Specimen: BLOOD  Result Value Ref Range Status   Specimen Description BLOOD RIGHT ANTECUBITAL  Final   Special Requests   Final    BOTTLES DRAWN AEROBIC AND ANAEROBIC Blood Culture  adequate volume   Culture   Final    NO GROWTH 5 DAYS Performed at [facility], [address] [place], [place] [address]    Report Status 10/20/2020 FINAL  Final  Aerobic Culture w Gram Stain (superficial specimen)     Status: None   Collection Time: 10/17/20  8:52 PM   Specimen: Wound  Result Value Ref Range Status   Specimen Description WOUND  Final   Special Requests NONE  Final   Gram Stain   Final    RARE SQUAMOUS EPITHELIAL CELLS PRESENT FEW WBC PRESENT, PREDOMINANTLY MONONUCLEAR ABUNDANT GRAM VARIABLE ROD FEW GRAM POSITIVE COCCI    Culture   Final    MODERATE ENTEROCOCCUS FAECALIS MODERATE KLEBSIELLA OXYTOCA ABUNDANT DIPHTHEROIDS(CORYNEBACTERIUM SPECIES) Standardized susceptibility testing for this organism is not available.    Report Status 10/20/2020 FINAL  Final   Organism ID, Bacteria ENTEROCOCCUS FAECALIS  Final   Organism ID, Bacteria KLEBSIELLA OXYTOCA  Final      Susceptibility   Enterococcus faecalis - MIC*    AMPICILLIN <=2 SENSITIVE Sensitive     VANCOMYCIN 1 SENSITIVE Sensitive     GENTAMICIN SYNERGY RESISTANT Resistant     * MODERATE ENTEROCOCCUS FAECALIS  Klebsiella oxytoca - MIC*    AMPICILLIN >=32 RESISTANT Resistant     CEFAZOLIN <=4 SENSITIVE Sensitive     CEFEPIME <=0.12 SENSITIVE Sensitive     CEFTAZIDIME <=1 SENSITIVE Sensitive     CEFTRIAXONE <=0.25 SENSITIVE Sensitive     CIPROFLOXACIN <=0.25 SENSITIVE Sensitive     GENTAMICIN <=1 SENSITIVE Sensitive     IMIPENEM <=0.25 SENSITIVE Sensitive     TRIMETH/SULFA <=20 SENSITIVE Sensitive     AMPICILLIN/SULBACTAM 8 SENSITIVE Sensitive     PIP/TAZO <=4 SENSITIVE Sensitive     * MODERATE KLEBSIELLA OXYTOCA  Aerobic/Anaerobic Culture w Gram Stain (surgical/deep wound)     Status: None   Collection Time: 10/21/20  3:07 PM   Specimen: PATH Digit amputation; Tissue  Result Value Ref Range Status   Specimen Description   Final    FOOT  RIGHT 5TH DIGIT AMPUTATION Performed at [facility], [address], [place], [place] [address]    Special Requests   Final    NONE Performed at [facility], [place], [place] [address]    Gram Stain   Final    FEW SQUAMOUS EPITHELIAL CELLS PRESENT FEW WBC SEEN FEW GRAM POSITIVE COCCI    Culture   Final    FEW STREPTOCOCCUS ANGINOSIS NO ANAEROBES ISOLATED Performed at [facility], [place] [address], [place], [place] [address]    Report Status 10/27/2020 FINAL  Final   Organism ID, Bacteria STREPTOCOCCUS ANGINOSIS  Final      Susceptibility   Streptococcus anginosis - MIC*    PENICILLIN 0.12 SENSITIVE Sensitive     CEFTRIAXONE 0.5 SENSITIVE Sensitive     ERYTHROMYCIN 4 RESISTANT Resistant     LEVOFLOXACIN 0.5 SENSITIVE Sensitive     VANCOMYCIN 0.5 SENSITIVE Sensitive     * FEW STREPTOCOCCUS ANGINOSIS  Aerobic/Anaerobic Culture w Gram Stain (surgical/deep wound)     Status: None   Collection Time: 10/26/20  5:35 PM   Specimen: PATH Other; Tissue  Result Value Ref Range Status   Specimen Description   Final    FOOT RIGHT Performed at [facility], [address] [place], [place] [address]    Special Requests   Final    NONE Performed at [facility], [address] [place], [place] [address]    Gram Stain NO WBC SEEN NO ORGANISMS SEEN   Final   Culture   Final    RARE STREPTOCOCCUS ANGINOSIS NO ANAEROBES ISOLATED Performed at [facility], [place] [address], [place], [place] [address]    Report Status 10/31/2020 FINAL  Final   Organism ID, Bacteria STREPTOCOCCUS ANGINOSIS  Final      Susceptibility   Streptococcus anginosis - MIC*    PENICILLIN 0.12 SENSITIVE Sensitive     CEFTRIAXONE 0.5 SENSITIVE Sensitive     ERYTHROMYCIN >=8 RESISTANT Resistant     LEVOFLOXACIN 0.5 SENSITIVE Sensitive     VANCOMYCIN 0.5 SENSITIVE Sensitive     * RARE STREPTOCOCCUS ANGINOSIS    Coagulation Studies: No results for input(s): LABPROT, INR in the last 72  hours.   Urinalysis: No results for input(s): COLORURINE, LABSPEC, PHURINE, GLUCOSEU, HGBUR, BILIRUBINUR, KETONESUR, PROTEINUR, UROBILINOGEN, NITRITE, LEUKOCYTESUR in the last 72 hours.  Invalid input(s): APPERANCEUR    Imaging: No results found.   Medications:    sodium chloride 0 mL/hr at 10/22/20 1200   [START ON 11/07/2020] cefTAZidime (FORTAZ)  IV     cefTAZidime (FORTAZ)  IV  cefTAZidime (FORTAZ)  IV Stopped (11/04/20 1429)   vancomycin 1,000 mg (11/04/20 1500)    vitamin C  500 mg Oral Daily   aspirin EC  81 mg Oral Daily   calcium carbonate  1 tablet Oral TID with meals   Chlorhexidine Gluconate Cloth  6 each Topical Daily   cholecalciferol  4,000 Units Oral Weekly   cinacalcet  60 mg Oral Daily   ciprofloxacin-dexamethasone  4 drop Both EARS BID   docusate sodium  100 mg Oral Daily   epoetin (EPOGEN/PROCRIT) injection  10,000 Units Intravenous Q T,Th,Sa-HD   feeding supplement (NEPRO CARB STEADY)  237 mL Oral BID BM   ferric citrate  210 mg Oral TID WC   heparin  5,000 Units Subcutaneous Q8H   insulin aspart  0-5 Units Subcutaneous QHS   insulin aspart  0-9 Units Subcutaneous TID WC   insulin aspart  3 Units Subcutaneous TID WC   insulin glargine-yfgn  43 Units Subcutaneous QHS   ipratropium-albuterol  3 mL Nebulization TID   linaclotide  290 mcg Oral q morning   loratadine  10 mg Oral Daily   metroNIDAZOLE  500 mg Oral Q12H   mometasone-formoterol  2 puff Inhalation BID   multivitamin  1 tablet Oral QHS   pantoprazole  40 mg Oral Daily   sodium chloride flush  3 mL Intravenous Q12H   sodium chloride, acetaminophen, albuterol, albuterol, ALPRAZolam, alum & mag hydroxide-simeth, calcium carbonate, dextromethorphan-guaiFENesin, diphenhydrAMINE, hydrALAZINE, HYDROmorphone (DILAUDID) injection, lactulose, metoprolol tartrate, ondansetron (ZOFRAN) IV, ondansetron, oxyCODONE, oxyCODONE, phenol, polyethylene glycol, sodium chloride flush, traZODone  Assessment/  Plan:  [name] is a 50 y.o. black female with end stage renal disease on hemodialysis, diabetes mellitus type II, hypertension, congestive heart failure, COPD, who is admitted to [facility] on 10/15/2020 for Gangrene [facility]) [I96] Diabetic foot ulcer ([place]) [J49.702, L97.509] Decreased pedal pulses [R09.89] Sepsis, due to unspecified organism, unspecified whether acute organ dysfunction present [facility]) [A41.9]  [facility] TTS Left AVF 127.5kg   Impression  1)Renal    End-stage renal disease Patient is on hemodialysis Patient is on Tuesday Thursday Saturday schedule Patient was last dialyzed yesterday No need for renal placement therapy today  2)HTN    Blood pressure is stable    3)Anemia of chronic disease  CBC Latest Ref Rng & Units 11/03/2020 10/31/2020 10/29/2020  WBC 4.0 - 10.5 K/uL 13.5(H) 15.3(H) 19.8(H)  Hemoglobin 12.0 - 15.0 g/dL 8.1(L) 7.5(L) 8.1(L)  Hematocrit 36.0 - 46.0 % 25.4(L) 23.4(L) 25.3(L)  Platelets 150 - 400 K/uL 309 296 341       HGb is not at goal (9--11) Patient is on Epogen Patient hemoglobin is improving  4) Secondary hyperparathyroidism -CKD Mineral-Bone Disorder    Lab Results  Component Value Date   PTH 134 (H) 04/21/2017   CALCIUM 7.1 (L) 11/03/2020   CAION 0.81 (LL) 10/26/2020   PHOS 4.3 11/03/2020    Secondary Hyperparathyroidism present Phosphorus at goal.   5)Diabetes mellitus type II with chronic kidney disease: insulin dependent.  Patient diabetes is complicated with diabetic foot ulcer.  Patient has history of poor control. Hemoglobin A1c 9.2% on 09/19/2020.  Vascular performed right lower angiogram. 30% mild lesion found popliteal artery.  It was deemed adequate for wound healing.  Podiatry performed right foot, partial fifth toe amputation on 10/21/20. Dressing changes determine need for further amputation. Patient requesting second opinion. Patient is currently on IV antibiotics Patient is being followed by podiatry  vascular the primary team  6) Electrolytes   BMP Latest Ref Rng & Units 11/03/2020 10/31/2020 10/29/2020  Glucose 70 - 99 mg/dL 198(H) 266(H) 277(H)  BUN 6 - 20 mg/dL 46(H) 48(H) 43(H)  Creatinine 0.44 - 1.00 mg/dL 7.30(H) 7.53(H) 7.04(H)  Sodium 135 - 145 mmol/L 125(L) 127(L) 129(L)  Potassium 3.5 - 5.1 mmol/L 3.6 3.8 3.8  Chloride 98 - 111 mmol/L 87(L) 90(L) 91(L)  CO2 22 - 32 mmol/L 22 23 23   Calcium 8.9 - 10.3 mg/dL 7.1(L) 6.4(LL) 6.9(L)     Sodium Hyponatremia Secondary to ESRD Inability to get rid of free water   Potassium Normokalemic    7)Acid base  Co2 at goal    Plan   We will continue current treatment. No need for renal replacement therapy today We will follow-up on the labs   LOS: 21 Reeve Turnley s [facility] 9/11/20229:52 AM

## 2020-11-05 NOTE — Progress Notes (Signed)
PROGRESS NOTE    [name]  KDX:[id number] DOB: 1970/07/31 DOA: 10/15/2020 PCP: [name], NP    Brief Narrative:  Patient admitted 15 days ago with diabetic foot ulcers with gangrene of the fifth toe and osteomyelitis.  Past medical history chronic diastolic congestive heart failure, chronic hypoxic respiratory failure on 3 to 4 L of oxygen, end-stage renal disease with diabetes, morbid obesity.  On 10/21/2020 had amputation of the fifth ray and I&D of abscess on the right foot.  On 10/26/2020 had washout procedure.  The patient initially was on IV Zosyn.  Switched over to ceftazidime with dosing with dialysis at this point.  Specialist had recommended amputation but patient is not ready to make that decision at this point in time.   Assessment & Plan:   Principal Problem:   Diabetic foot ulcer ([place]) Active Problems:   Diabetes ([place])   Obesity, Class III, BMI 40-49.9 (morbid obesity) ([place])   Essential hypertension   Type 2 diabetes mellitus with ESRD (end-stage renal disease) ([place])   DNR (do not resuscitate) discussion   End-stage renal disease on hemodialysis ([place])   Chronic respiratory failure with hypoxia (HCC)   Decreased pedal pulses   Gangrene (HCC)   PVD (peripheral vascular disease) (HCC)   Aspiration into airway   Hearing loss of right ear   Hyponatremia   Anemia of chronic disease  Diabetic fold associate with gangrene of the fifth toe associated with osteomyelitis Status post amputation of fifth ray and I&D on 8/27 Washout procedure on 10/26/2020 ID and podiatry following Reaffirmed with patient, she is declining amputation at this time Encouraged her not to delay her decision and seek out surgical evaluation when she is ready to make the decision Plan: IV vancomycin and ceftazidime with dialysis Will need 4 weeks of therapy Appreciate ID follow-up Outpatient antibiotic regimen in place.   Patient is medically stable for discharge at this time Will need  skilled nursing facility.  Pending bed search  ESRD on HD Nephrology consulted TTS dialysis schedule Follow nephrology recommendation for inpatient HD needs Patient will need a facility to accommodate antibiotics with dialysis Plan: Next hemodialysis Tuesday  Diabetes mellitus type 2, uncontrolled Semglee 43 units nightly NovoLog 3 units 3 times daily with meals NovoLog and sliding scale Carb modified diet  Anemia of chronic kidney disease Stable, will monitor  Peripheral vascular disease Continue aspirin  Hyponatremia Managed with hemodialysis  Chronic hypoxic respiratory failure At baseline, 3 to 4 L  Morbid obesity This complicates overall care and prognosis  Weakness Functional decline Will need skilled nursing facility at time of discharge   DVT prophylaxis: SQ Lovenox Code Status: Full Family Communication: None today Disposition Plan: Status is: Inpatient  Remains inpatient appropriate because:Unsafe d/c plan  Dispo: The patient is from: Home              Anticipated d/c is to: SNF              Patient currently is medically stable to d/c.   Difficult to place patient No  Patient is refusing amputation as recommended by our surgical teams.  As such she is medically stable for discharge at this time.  She will be on 4 weeks of antibiotics.  TOC aware, bed search initiated.  Patient can discharge once SNF bed is found.   Level of care: Med-Surg  Consultants:  ID Podiatry Nephrology  Procedures:  8/24 angiogram right lower extremity 8/27 amputation of the fifth ray and  I&D of abscess of the right foot 9/1 washout procedure  Antimicrobials:  Vancomycin Ceftazidime   Subjective: Patient seen and examined.  Husband at bedside.  No visible distress.  Answer questions appropriately  Objective: Vitals:   11/05/20 0703 11/05/20 0828 11/05/20 0900 11/05/20 1314  BP:  (!) 136/98    Pulse:  93 94   Resp:  16 16   Temp:  97.9 F (36.6 C)     TempSrc:      SpO2: 98% 100%  100%  Weight:      Height:        Intake/Output Summary (Last 24 hours) at 11/05/2020 1401 Last data filed at 11/05/2020 0300 Gross per 24 hour  Intake 480 ml  Output --  Net 480 ml   Filed Weights   10/15/20 1905 10/18/20 0928  Weight: 126.1 kg 126.1 kg    Examination:  General exam: Appears calm and comfortable  Respiratory system: Clear to auscultation. Respiratory effort normal. Cardiovascular system: S1-S2, RRR, no murmurs, no pedal edema Gastrointestinal system: Obese, nontender, nondistended, normal bowel sounds Central nervous system: Alert and oriented. No focal neurological deficits. Extremities: Decreased power bilateral lower extremities Skin: Lower leg with skin darkening, significant nonpitting edema.  Right foot with significant wounds Psychiatry: Judgement and insight appear normal. Mood & affect appropriate.     Data Reviewed: I have personally reviewed following labs and imaging studies  CBC: Recent Labs  Lab 10/31/20 0408 11/03/20 0838  WBC 15.3* 13.5*  NEUTROABS 12.6*  --   HGB 7.5* 8.1*  HCT 23.4* 25.4*  MCV 95.5 94.4  PLT 296 170   Basic Metabolic Panel: Recent Labs  Lab 10/31/20 0408 11/03/20 0838  NA 127* 125*  K 3.8 3.6  CL 90* 87*  CO2 23 22  GLUCOSE 266* 198*  BUN 48* 46*  CREATININE 7.53* 7.30*  CALCIUM 6.4* 7.1*  PHOS  --  4.3   GFR: Estimated Creatinine Clearance: 12.4 mL/min (A) (by C-G formula based on SCr of 7.3 mg/dL (H)). Liver Function Tests: Recent Labs  Lab 11/03/20 0838  ALBUMIN 2.4*   No results for input(s): LIPASE, AMYLASE in the last 168 hours. No results for input(s): AMMONIA in the last 168 hours. Coagulation Profile: No results for input(s): INR, PROTIME in the last 168 hours.  Cardiac Enzymes: No results for input(s): CKTOTAL, CKMB, CKMBINDEX, TROPONINI in the last 168 hours. BNP (last 3 results) No results for input(s): PROBNP in the last 8760 hours. HbA1C: No  results for input(s): HGBA1C in the last 72 hours. CBG: Recent Labs  Lab 11/04/20 1423 11/04/20 1623 11/04/20 2039 11/05/20 0807 11/05/20 1209  GLUCAP 187* 241* 189* 132* 151*   Lipid Profile: No results for input(s): CHOL, HDL, LDLCALC, TRIG, CHOLHDL, LDLDIRECT in the last 72 hours. Thyroid Function Tests: No results for input(s): TSH, T4TOTAL, FREET4, T3FREE, THYROIDAB in the last 72 hours. Anemia Panel: No results for input(s): VITAMINB12, FOLATE, FERRITIN, TIBC, IRON, RETICCTPCT in the last 72 hours. Sepsis Labs: No results for input(s): PROCALCITON, LATICACIDVEN in the last 168 hours.  Recent Results (from the past 240 hour(s))  Aerobic/Anaerobic Culture w Gram Stain (surgical/deep wound)     Status: None   Collection Time: 10/26/20  5:35 PM   Specimen: PATH Other; Tissue  Result Value Ref Range Status   Specimen Description   Final    FOOT RIGHT Performed at [facility], [address], [place], [place] [address]    Special Requests   Final  NONE Performed at [facility], [place], [place] [address]    Gram Stain NO WBC SEEN NO ORGANISMS SEEN   Final   Culture   Final    RARE STREPTOCOCCUS ANGINOSIS NO ANAEROBES ISOLATED Performed at [facility], [place] [address] [place], [place] [address]    Report Status 10/31/2020 FINAL  Final   Organism ID, Bacteria STREPTOCOCCUS ANGINOSIS  Final      Susceptibility   Streptococcus anginosis - MIC*    PENICILLIN 0.12 SENSITIVE Sensitive     CEFTRIAXONE 0.5 SENSITIVE Sensitive     ERYTHROMYCIN >=8 RESISTANT Resistant     LEVOFLOXACIN 0.5 SENSITIVE Sensitive     VANCOMYCIN 0.5 SENSITIVE Sensitive     * RARE STREPTOCOCCUS ANGINOSIS         Radiology Studies: No results found.      Scheduled Meds:  vitamin C  500 mg Oral Daily   aspirin EC  81 mg Oral Daily   calcium carbonate  1 tablet Oral TID with meals   Chlorhexidine Gluconate Cloth  6 each Topical Daily    cholecalciferol  4,000 Units Oral Weekly   cinacalcet  60 mg Oral Daily   ciprofloxacin-dexamethasone  4 drop Both EARS BID   docusate sodium  100 mg Oral Daily   epoetin (EPOGEN/PROCRIT) injection  10,000 Units Intravenous Q T,Th,Sa-HD   feeding supplement (NEPRO CARB STEADY)  237 mL Oral BID BM   ferric citrate  210 mg Oral TID WC   heparin  5,000 Units Subcutaneous Q8H   insulin aspart  0-5 Units Subcutaneous QHS   insulin aspart  0-9 Units Subcutaneous TID WC   insulin aspart  3 Units Subcutaneous TID WC   insulin glargine-yfgn  43 Units Subcutaneous QHS   ipratropium-albuterol  3 mL Nebulization TID   linaclotide  290 mcg Oral q morning   loratadine  10 mg Oral Daily   metroNIDAZOLE  500 mg Oral Q12H   mometasone-formoterol  2 puff Inhalation BID   multivitamin  1 tablet Oral QHS   pantoprazole  40 mg Oral Daily   sodium chloride flush  3 mL Intravenous Q12H   Continuous Infusions:  sodium chloride 0 mL/hr at 10/22/20 1200   [START ON 11/07/2020] cefTAZidime (FORTAZ)  IV     cefTAZidime (FORTAZ)  IV     cefTAZidime (FORTAZ)  IV Stopped (11/04/20 1429)   vancomycin 1,000 mg (11/04/20 1500)     LOS: 21 days    Time spent: 15 minutes    Sidney Ace, MD [facility] Pager [phone]  If 7PM-7AM, please contact night-coverage  11/05/2020, 2:01 PM

## 2020-11-06 DIAGNOSIS — E11621 Type 2 diabetes mellitus with foot ulcer: Secondary | ICD-10-CM | POA: Diagnosis not present

## 2020-11-06 DIAGNOSIS — L97514 Non-pressure chronic ulcer of other part of right foot with necrosis of bone: Secondary | ICD-10-CM | POA: Diagnosis not present

## 2020-11-06 LAB — BASIC METABOLIC PANEL
Anion gap: 12 (ref 5–15)
BUN: 52 mg/dL — ABNORMAL HIGH (ref 6–20)
CO2: 23 mmol/L (ref 22–32)
Calcium: 6.8 mg/dL — ABNORMAL LOW (ref 8.9–10.3)
Chloride: 88 mmol/L — ABNORMAL LOW (ref 98–111)
Creatinine, Ser: 8.04 mg/dL — ABNORMAL HIGH (ref 0.44–1.00)
GFR, Estimated: 6 mL/min — ABNORMAL LOW (ref >60)
Glucose, Bld: 208 mg/dL — ABNORMAL HIGH (ref 70–99)
Potassium: 4 mmol/L (ref 3.5–5.1)
Sodium: 123 mmol/L — ABNORMAL LOW (ref 135–145)

## 2020-11-06 LAB — CBC
HCT: 23.6 % — ABNORMAL LOW (ref 36.0–46.0)
Hemoglobin: 7.5 g/dL — ABNORMAL LOW (ref 12.0–15.0)
MCH: 30.5 pg (ref 26.0–34.0)
MCHC: 31.8 g/dL (ref 30.0–36.0)
MCV: 95.9 fL (ref 80.0–100.0)
Platelets: 318 10*3/uL (ref 150–400)
RBC: 2.46 MIL/uL — ABNORMAL LOW (ref 3.87–5.11)
RDW: 14.4 % (ref 11.5–15.5)
WBC: 12.5 10*3/uL — ABNORMAL HIGH (ref 4.0–10.5)
nRBC: 0 % (ref 0.0–0.2)

## 2020-11-06 LAB — GLUCOSE, CAPILLARY
Glucose-Capillary: 162 mg/dL — ABNORMAL HIGH (ref 70–99)
Glucose-Capillary: 166 mg/dL — ABNORMAL HIGH (ref 70–99)
Glucose-Capillary: 158 mg/dL — ABNORMAL HIGH (ref 70–99)
Glucose-Capillary: 182 mg/dL — ABNORMAL HIGH (ref 70–99)

## 2020-11-06 LAB — PHOSPHORUS: Phosphorus: 3.9 mg/dL (ref 2.5–4.6)

## 2020-11-06 MED ORDER — BENZONATATE 100 MG PO CAPS
200.0000 mg | ORAL_CAPSULE | Freq: Three times a day (TID) | ORAL | Status: DC
  Administered 2020-11-06 – 2020-11-10 (×12): 200 mg via ORAL
  Filled 2020-11-06 (×12): qty 2

## 2020-11-06 NOTE — Progress Notes (Signed)
[facility]  ROUNDING NOTE   Subjective:   Patient states she wants to go to a skilled nursing facility for rehab.    Objective:  Vital signs in last 24 hours:  Temp:  [97.9 F (36.6 C)-98.6 F (37 C)] 98.2 F (36.8 C) (09/12 0729) Pulse Rate:  [92-100] 92 (09/12 0729) Resp:  [15-18] 15 (09/12 0729) BP: (96-137)/(66-82) 136/82 (09/12 0729) SpO2:  [93 %-100 %] 100 % (09/12 0729) Weight:  [146.7 kg] 146.7 kg (09/12 0646)  Weight change:  Filed Weights   10/15/20 1905 10/18/20 0928 11/06/20 0646  Weight: 126.1 kg 126.1 kg (!) 146.7 kg    Intake/Output: I/O last 3 completed shifts: In: 600 [P.O.:600] Out: -    Intake/Output this shift:  No intake/output data recorded.  Physical Exam: General: NAD,resting in bed  Head: Normocephalic, atraumatic. Moist oral mucosal membranes  Eyes: Anicteric  Lungs:  Clear to auscultation, O2 2L  Heart: Regular rate and rhythm  Abdomen:  Soft, nontender  Extremities: trace peripheral edema. Right lower extremity in dressings.   Neurologic: Alert, moving all four extremities  Skin: No lesions  Access: Left AVF    Basic Metabolic Panel: Recent Labs  Lab 10/31/20 0408 11/03/20 0838 11/06/20 0408  NA 127* 125* 123*  K 3.8 3.6 4.0  CL 90* 87* 88*  CO2 23 22 23   GLUCOSE 266* 198* 208*  BUN 48* 46* 52*  CREATININE 7.53* 7.30* 8.04*  CALCIUM 6.4* 7.1* 6.8*  PHOS  --  4.3 3.9     Liver Function Tests: Recent Labs  Lab 11/03/20 0838  ALBUMIN 2.4*    No results for input(s): LIPASE, AMYLASE in the last 168 hours. No results for input(s): AMMONIA in the last 168 hours.  CBC: Recent Labs  Lab 10/31/20 0408 11/03/20 0838 11/06/20 0408  WBC 15.3* 13.5* 12.5*  NEUTROABS 12.6*  --   --   HGB 7.5* 8.1* 7.5*  HCT 23.4* 25.4* 23.6*  MCV 95.5 94.4 95.9  PLT 296 309 318     Cardiac Enzymes: No results for input(s): CKTOTAL, CKMB, CKMBINDEX, TROPONINI in the last 168 hours.  BNP: Invalid input(s):  POCBNP  CBG: Recent Labs  Lab 11/05/20 1209 11/05/20 1630 11/05/20 2044 11/06/20 0735 11/06/20 1214  GLUCAP 151* 159* 256* 162* 166*     Microbiology: Results for orders placed or performed during the hospital encounter of 10/15/20  Blood Culture (routine x 2)     Status: None   Collection Time: 10/15/20  8:03 PM   Specimen: BLOOD  Result Value Ref Range Status   Specimen Description BLOOD BLOOD RIGHT FOREARM  Final   Special Requests   Final    BOTTLES DRAWN AEROBIC AND ANAEROBIC Blood Culture adequate volume   Culture   Final    NO GROWTH 5 DAYS Performed at [facility], [address] [place], [place] [address]    Report Status 10/20/2020 FINAL  Final  Resp Panel by RT-PCR (Flu A&B, Covid) Nasopharyngeal Swab     Status: None   Collection Time: 10/15/20  8:26 PM   Specimen: Nasopharyngeal Swab; Nasopharyngeal(NP) swabs in vial transport medium  Result Value Ref Range Status   SARS Coronavirus 2 by RT PCR NEGATIVE NEGATIVE Final    Comment: (NOTE) SARS-CoV-2 target nucleic acids are NOT DETECTED.  The SARS-CoV-2 RNA is generally detectable in upper respiratory specimens during the acute phase of infection. The lowest concentration of SARS-CoV-2 viral copies this assay can detect is 138 copies/mL. A  negative result does not preclude SARS-Cov-2 infection and should not be used as the sole basis for treatment or other patient management decisions. A negative result may occur with  improper specimen collection/handling, submission of specimen other than nasopharyngeal swab, presence of viral mutation(s) within the areas targeted by this assay, and inadequate number of viral copies(<138 copies/mL). A negative result must be combined with clinical observations, patient history, and epidemiological information. The expected result is Negative.  Fact Sheet for Patients:  EntrepreneurPulse.com.au  Fact Sheet for Healthcare Providers:   IncredibleEmployment.be  This test is no t yet approved or cleared by the [place] FDA and  has been authorized for detection and/or diagnosis of SARS-CoV-2 by FDA under an Emergency Use Authorization (EUA). This EUA will remain  in effect (meaning this test can be used) for the duration of the COVID-19 declaration under Section 564(b)(1) of the Act, 21 U.S.C.section 360bbb-3(b)(1), unless the authorization is terminated  or revoked sooner.       Influenza A by PCR NEGATIVE NEGATIVE Final   Influenza B by PCR NEGATIVE NEGATIVE Final    Comment: (NOTE) The Xpert Xpress SARS-CoV-2/FLU/RSV plus assay is intended as an aid in the diagnosis of influenza from Nasopharyngeal swab specimens and should not be used as a sole basis for treatment. Nasal washings and aspirates are unacceptable for Xpert Xpress SARS-CoV-2/FLU/RSV testing.  Fact Sheet for Patients: EntrepreneurPulse.com.au  Fact Sheet for Healthcare Providers: IncredibleEmployment.be  This test is not yet approved or cleared by the [place] FDA and has been authorized for detection and/or diagnosis of SARS-CoV-2 by FDA under an Emergency Use Authorization (EUA). This EUA will remain in effect (meaning this test can be used) for the duration of the COVID-19 declaration under Section 564(b)(1) of the Act, 21 U.S.C. section 360bbb-3(b)(1), unless the authorization is terminated or revoked.  Performed at [facility], [address] [place], [place] [address]   Blood Culture (routine x 2)     Status: None   Collection Time: 10/15/20  8:26 PM   Specimen: BLOOD  Result Value Ref Range Status   Specimen Description BLOOD RIGHT ANTECUBITAL  Final   Special Requests   Final    BOTTLES DRAWN AEROBIC AND ANAEROBIC Blood Culture adequate volume   Culture   Final    NO GROWTH 5 DAYS Performed at [facility], [address] [place], [place]  [address]    Report Status 10/20/2020 FINAL  Final  Aerobic Culture w Gram Stain (superficial specimen)     Status: None   Collection Time: 10/17/20  8:52 PM   Specimen: Wound  Result Value Ref Range Status   Specimen Description WOUND  Final   Special Requests NONE  Final   Gram Stain   Final    RARE SQUAMOUS EPITHELIAL CELLS PRESENT FEW WBC PRESENT, PREDOMINANTLY MONONUCLEAR ABUNDANT GRAM VARIABLE ROD FEW GRAM POSITIVE COCCI    Culture   Final    MODERATE ENTEROCOCCUS FAECALIS MODERATE KLEBSIELLA OXYTOCA ABUNDANT DIPHTHEROIDS(CORYNEBACTERIUM SPECIES) Standardized susceptibility testing for this organism is not available.    Report Status 10/20/2020 FINAL  Final   Organism ID, Bacteria ENTEROCOCCUS FAECALIS  Final   Organism ID, Bacteria KLEBSIELLA OXYTOCA  Final      Susceptibility   Enterococcus faecalis - MIC*    AMPICILLIN <=2 SENSITIVE Sensitive     VANCOMYCIN 1 SENSITIVE Sensitive     GENTAMICIN SYNERGY RESISTANT Resistant     * MODERATE ENTEROCOCCUS FAECALIS   Klebsiella oxytoca - MIC*  AMPICILLIN >=32 RESISTANT Resistant     CEFAZOLIN <=4 SENSITIVE Sensitive     CEFEPIME <=0.12 SENSITIVE Sensitive     CEFTAZIDIME <=1 SENSITIVE Sensitive     CEFTRIAXONE <=0.25 SENSITIVE Sensitive     CIPROFLOXACIN <=0.25 SENSITIVE Sensitive     GENTAMICIN <=1 SENSITIVE Sensitive     IMIPENEM <=0.25 SENSITIVE Sensitive     TRIMETH/SULFA <=20 SENSITIVE Sensitive     AMPICILLIN/SULBACTAM 8 SENSITIVE Sensitive     PIP/TAZO <=4 SENSITIVE Sensitive     * MODERATE KLEBSIELLA OXYTOCA  Aerobic/Anaerobic Culture w Gram Stain (surgical/deep wound)     Status: None   Collection Time: 10/21/20  3:07 PM   Specimen: PATH Digit amputation; Tissue  Result Value Ref Range Status   Specimen Description   Final    FOOT  RIGHT 5TH DIGIT AMPUTATION Performed at [facility], [address] [place], [place] [address]    Special Requests   Final    NONE Performed at [facility], [place], [place] [address]    Gram Stain   Final    FEW SQUAMOUS EPITHELIAL CELLS PRESENT FEW WBC SEEN FEW GRAM POSITIVE COCCI    Culture   Final    FEW STREPTOCOCCUS ANGINOSIS NO ANAEROBES ISOLATED Performed at [facility], [place] [address] [place], [place] [address]    Report Status 10/27/2020 FINAL  Final   Organism ID, Bacteria STREPTOCOCCUS ANGINOSIS  Final      Susceptibility   Streptococcus anginosis - MIC*    PENICILLIN 0.12 SENSITIVE Sensitive     CEFTRIAXONE 0.5 SENSITIVE Sensitive     ERYTHROMYCIN 4 RESISTANT Resistant     LEVOFLOXACIN 0.5 SENSITIVE Sensitive     VANCOMYCIN 0.5 SENSITIVE Sensitive     * FEW STREPTOCOCCUS ANGINOSIS  Aerobic/Anaerobic Culture w Gram Stain (surgical/deep wound)     Status: None   Collection Time: 10/26/20  5:35 PM   Specimen: PATH Other; Tissue  Result Value Ref Range Status   Specimen Description   Final    FOOT RIGHT Performed at [facility], [address] [place], [place] [address]    Special Requests   Final    NONE Performed at [facility], [address] [place], [place] [address]    Gram Stain NO WBC SEEN NO ORGANISMS SEEN   Final   Culture   Final    RARE STREPTOCOCCUS ANGINOSIS NO ANAEROBES ISOLATED Performed at [facility], [place] [address] [place], [place] [address]    Report Status 10/31/2020 FINAL  Final   Organism ID, Bacteria STREPTOCOCCUS ANGINOSIS  Final      Susceptibility   Streptococcus anginosis - MIC*    PENICILLIN 0.12 SENSITIVE Sensitive     CEFTRIAXONE 0.5 SENSITIVE Sensitive     ERYTHROMYCIN >=8 RESISTANT Resistant     LEVOFLOXACIN 0.5 SENSITIVE Sensitive     VANCOMYCIN 0.5 SENSITIVE Sensitive     * RARE STREPTOCOCCUS ANGINOSIS    Coagulation Studies: No results for input(s): LABPROT, INR in the last 72 hours.   Urinalysis: No results for input(s): COLORURINE, LABSPEC, PHURINE, GLUCOSEU, HGBUR, BILIRUBINUR, KETONESUR, PROTEINUR,  UROBILINOGEN, NITRITE, LEUKOCYTESUR in the last 72 hours.  Invalid input(s): APPERANCEUR    Imaging: No results found.   Medications:    sodium chloride 0 mL/hr at 10/22/20 1200   [START ON 11/07/2020] cefTAZidime (FORTAZ)  IV     cefTAZidime (FORTAZ)  IV     cefTAZidime (FORTAZ)  IV Stopped (  11/04/20 1429)   vancomycin 1,000 mg (11/04/20 1500)    vitamin C  500 mg Oral Daily   aspirin EC  81 mg Oral Daily   calcium carbonate  1 tablet Oral TID with meals   Chlorhexidine Gluconate Cloth  6 each Topical Daily   cholecalciferol  4,000 Units Oral Weekly   cinacalcet  60 mg Oral Daily   ciprofloxacin-dexamethasone  4 drop Both EARS BID   docusate sodium  100 mg Oral Daily   epoetin (EPOGEN/PROCRIT) injection  10,000 Units Intravenous Q T,Th,Sa-HD   feeding supplement (NEPRO CARB STEADY)  237 mL Oral BID BM   ferric citrate  210 mg Oral TID WC   heparin  5,000 Units Subcutaneous Q8H   insulin aspart  0-5 Units Subcutaneous QHS   insulin aspart  0-9 Units Subcutaneous TID WC   insulin aspart  3 Units Subcutaneous TID WC   insulin glargine-yfgn  43 Units Subcutaneous QHS   ipratropium-albuterol  3 mL Nebulization TID   linaclotide  290 mcg Oral q morning   loratadine  10 mg Oral Daily   metroNIDAZOLE  500 mg Oral Q12H   mometasone-formoterol  2 puff Inhalation BID   multivitamin  1 tablet Oral QHS   pantoprazole  40 mg Oral Daily   sodium chloride flush  3 mL Intravenous Q12H   sodium chloride, acetaminophen, albuterol, albuterol, ALPRAZolam, alum & mag hydroxide-simeth, calcium carbonate, dextromethorphan-guaiFENesin, diphenhydrAMINE, hydrALAZINE, HYDROmorphone (DILAUDID) injection, lactulose, metoprolol tartrate, ondansetron (ZOFRAN) IV, ondansetron, oxyCODONE, oxyCODONE, phenol, polyethylene glycol, sodium chloride flush, traZODone  Assessment/ Plan:  [name] is a 50 y.o. black female with end stage renal disease on hemodialysis, diabetes mellitus type II,  hypertension, congestive heart failure, COPD, who is admitted to [facility] on 10[facility]15/2020 for [facility]) [I96] Diabetic foot ulcer ([place]) [R83.094, L97.509] Decreased pedal pulses [R09.89] Sepsis, due to unspecified organism, unspecified whether acute organ dysfunction present [facility]) [A41.9]  [facility] TTS Left AVF 127.5kg  End stage renal disease:  - Dialysis for tomorrow. Continue TTS schedule.   Hypertension: 131/69. Currently not on any blood pressure agents.  Hyponatremia: secondary to ESRD. Correct with dialysis.   Diabetes mellitus type II with chronic kidney disease: complicated by diabetic foot ulcer and osteomyelitis. Not well controlled. Hemoglobin A1c 8.9%.   Anemia of chronic kidney disease: hemoglobin 7.5, normocytic.  - EPO on HD treatments.   Secondary Hyperparathyroidism: with hypocalcemia.  - Calcium carbonate  - Discontinue cincalcet.  - Continue Auryxia.     LOS: [place] 9/12/202212:40 PM

## 2020-11-06 NOTE — Progress Notes (Signed)
PROGRESS NOTE    [name]  QQI:[id number] DOB: 1970/03/30 DOA: 10/15/2020 PCP: [name], NP    Brief Narrative:  Patient admitted 15 days ago with diabetic foot ulcers with gangrene of the fifth toe and osteomyelitis.  Past medical history chronic diastolic congestive heart failure, chronic hypoxic respiratory failure on 3 to 4 L of oxygen, end-stage renal disease with diabetes, morbid obesity.  On 10/21/2020 had amputation of the fifth ray and I&D of abscess on the right foot.  On 10/26/2020 had washout procedure.  The patient initially was on IV Zosyn.  Switched over to ceftazidime with dosing with dialysis at this point.  Specialist had recommended amputation but patient is not ready to make that decision at this point in time.   Assessment & Plan:   Principal Problem:   Diabetic foot ulcer ([place]) Active Problems:   Diabetes ([place])   Obesity, Class III, BMI 40-49.9 (morbid obesity) ([place])   Essential hypertension   Type 2 diabetes mellitus with ESRD (end-stage renal disease) ([place])   DNR (do not resuscitate) discussion   End-stage renal disease on hemodialysis ([place])   Chronic respiratory failure with hypoxia (HCC)   Decreased pedal pulses   Gangrene (HCC)   PVD (peripheral vascular disease) (HCC)   Aspiration into airway   Hearing loss of right ear   Hyponatremia   Anemia of chronic disease  Diabetic fold associate with gangrene of the fifth toe associated with osteomyelitis Status post amputation of fifth ray and I&D on 8/27 Washout procedure on 10/26/2020 ID and podiatry following Reaffirmed with patient, she is declining amputation at this time Encouraged her not to delay her decision and seek out surgical evaluation when she is ready to make the decision Plan: IV vancomycin and ceftazidime with dialysis Will need 4 weeks of therapy Appreciate ID follow-up Outpatient antibiotic regimen in place.   Patient is medically stable for discharge at this time Will need  skilled nursing facility.   TOC consulted.  Bed search in progress  ESRD on HD Nephrology consulted TTS dialysis schedule Follow nephrology recommendation for inpatient HD needs Patient will need a facility to accommodate antibiotics with dialysis Plan: Next hemodialysis Tuesday 9/13  Diabetes mellitus type 2, uncontrolled Semglee 43 units nightly NovoLog 3 units 3 times daily with meals NovoLog and sliding scale Carb modified diet  Anemia of chronic kidney disease Stable, will monitor  Peripheral vascular disease Continue aspirin  Hyponatremia Managed with hemodialysis  Chronic hypoxic respiratory failure At baseline, 3 to 4 L  Morbid obesity This complicates overall care and prognosis  Weakness Functional decline Will need skilled nursing facility at time of discharge   DVT prophylaxis: SQ Lovenox Code Status: Full Family Communication: None today Disposition Plan: Status is: Inpatient  Remains inpatient appropriate because:Unsafe d/c plan  Dispo: The patient is from: Home              Anticipated d/c is to: SNF              Patient currently is medically stable to d/c.   Difficult to place patient No  Patient is refusing amputation as recommended by our surgical teams.  As such she is medically stable for discharge at this time.  She will be on 4 weeks of antibiotics.  TOC aware, bed search initiated.  Patient can discharge once SNF bed is found.   Level of care: Med-Surg  Consultants:  ID Podiatry Nephrology  Procedures:  8/24 angiogram right lower extremity 8/27  amputation of the fifth ray and I&D of abscess of the right foot 9/1 washout procedure  Antimicrobials:  Vancomycin Ceftazidime   Subjective: Patient seen and examined.  Sitting up in bed.  I stressed importance of therapy evaluations and adherence to dressing changes.  Patient understands and agrees.  Objective: Vitals:   11/05/20 2044 11/06/20 0420 11/06/20 0646 11/06/20 0729   BP: 137/66 96/68  136/82  Pulse: 100 96  92  Resp: 18 18  15   Temp: 98.6 F (37 C) 97.9 F (36.6 C)  98.2 F (36.8 C)  TempSrc:      SpO2: 93% 96%  100%  Weight:   (!) 146.7 kg   Height:        Intake/Output Summary (Last 24 hours) at 11/06/2020 1055 Last data filed at 11/05/2020 1833 Gross per 24 hour  Intake 120 ml  Output --  Net 120 ml   Filed Weights   10/15/20 1905 10/18/20 0928 11/06/20 0646  Weight: 126.1 kg 126.1 kg (!) 146.7 kg    Examination:  General exam: Appears calm and comfortable  Respiratory system: Clear to auscultation. Respiratory effort normal. Cardiovascular system: S1-S2, RRR, no murmurs, no pedal edema Gastrointestinal system: Obese, nontender, nondistended, normal bowel sounds Central nervous system: Alert and oriented. No focal neurological deficits. Extremities: Decreased power bilateral lower extremities Skin: Lower leg with skin darkening, significant nonpitting edema.  Right foot with significant wounds Psychiatry: Judgement and insight appear normal. Mood & affect appropriate.     Data Reviewed: I have personally reviewed following labs and imaging studies  CBC: Recent Labs  Lab 10/31/20 0408 11/03/20 0838 11/06/20 0408  WBC 15.3* 13.5* 12.5*  NEUTROABS 12.6*  --   --   HGB 7.5* 8.1* 7.5*  HCT 23.4* 25.4* 23.6*  MCV 95.5 94.4 95.9  PLT 296 309 517   Basic Metabolic Panel: Recent Labs  Lab 10/31/20 0408 11/03/20 0838 11/06/20 0408  NA 127* 125* 123*  K 3.8 3.6 4.0  CL 90* 87* 88*  CO2 23 22 23   GLUCOSE 266* 198* 208*  BUN 48* 46* 52*  CREATININE 7.53* 7.30* 8.04*  CALCIUM 6.4* 7.1* 6.8*  PHOS  --  4.3 3.9   GFR: Estimated Creatinine Clearance: 12.3 mL/min (A) (by C-G formula based on SCr of 8.04 mg/dL (H)). Liver Function Tests: Recent Labs  Lab 11/03/20 0838  ALBUMIN 2.4*   No results for input(s): LIPASE, AMYLASE in the last 168 hours. No results for input(s): AMMONIA in the last 168 hours. Coagulation  Profile: No results for input(s): INR, PROTIME in the last 168 hours.  Cardiac Enzymes: No results for input(s): CKTOTAL, CKMB, CKMBINDEX, TROPONINI in the last 168 hours. BNP (last 3 results) No results for input(s): PROBNP in the last 8760 hours. HbA1C: No results for input(s): HGBA1C in the last 72 hours. CBG: Recent Labs  Lab 11/05/20 0807 11/05/20 1209 11/05/20 1630 11/05/20 2044 11/06/20 0735  GLUCAP 132* 151* 159* 256* 162*   Lipid Profile: No results for input(s): CHOL, HDL, LDLCALC, TRIG, CHOLHDL, LDLDIRECT in the last 72 hours. Thyroid Function Tests: No results for input(s): TSH, T4TOTAL, FREET4, T3FREE, THYROIDAB in the last 72 hours. Anemia Panel: No results for input(s): VITAMINB12, FOLATE, FERRITIN, TIBC, IRON, RETICCTPCT in the last 72 hours. Sepsis Labs: No results for input(s): PROCALCITON, LATICACIDVEN in the last 168 hours.  No results found for this or any previous visit (from the past 240 hour(s)).        Radiology Studies: No results  found.      Scheduled Meds:  vitamin C  500 mg Oral Daily   aspirin EC  81 mg Oral Daily   calcium carbonate  1 tablet Oral TID with meals   Chlorhexidine Gluconate Cloth  6 each Topical Daily   cholecalciferol  4,000 Units Oral Weekly   cinacalcet  60 mg Oral Daily   ciprofloxacin-dexamethasone  4 drop Both EARS BID   docusate sodium  100 mg Oral Daily   epoetin (EPOGEN/PROCRIT) injection  10,000 Units Intravenous Q T,Th,Sa-HD   feeding supplement (NEPRO CARB STEADY)  237 mL Oral BID BM   ferric citrate  210 mg Oral TID WC   heparin  5,000 Units Subcutaneous Q8H   insulin aspart  0-5 Units Subcutaneous QHS   insulin aspart  0-9 Units Subcutaneous TID WC   insulin aspart  3 Units Subcutaneous TID WC   insulin glargine-yfgn  43 Units Subcutaneous QHS   ipratropium-albuterol  3 mL Nebulization TID   linaclotide  290 mcg Oral q morning   loratadine  10 mg Oral Daily   metroNIDAZOLE  500 mg Oral Q12H    mometasone-formoterol  2 puff Inhalation BID   multivitamin  1 tablet Oral QHS   pantoprazole  40 mg Oral Daily   sodium chloride flush  3 mL Intravenous Q12H   Continuous Infusions:  sodium chloride 0 mL/hr at 10/22/20 1200   [START ON 11/07/2020] cefTAZidime (FORTAZ)  IV     cefTAZidime (FORTAZ)  IV     cefTAZidime (FORTAZ)  IV Stopped (11/04/20 1429)   vancomycin 1,000 mg (11/04/20 1500)     LOS: 22 days    Time spent: 15 minutes    Sidney Ace, MD [facility] Pager [phone]  If 7PM-7AM, please contact night-coverage  11/06/2020, 10:55 AM

## 2020-11-06 NOTE — Progress Notes (Signed)
Physical Therapy Treatment Patient Details Name: [name] MRN: [id number] DOB: 1970-10-16 Today's Date: 11/06/2020   History of Present Illness [name]  is a 50 y.o. female presenting to hospital 8/21 with chest pain, fever, fatigue, and generalized malaise; also foul smell from R great toe (chronic ulcer).  Pt admitted with diabetic foot ulcers with gangrene of the 5th toe and osteomyelitis. S/p R LE angio 8/24; s/p partial R foot 5th toe amp 8/27; and s/p I&D with revisional debridement R foot 9/1.  Per PT order 9/2: pt [name] R foot. PMH includes DM, ESRD, anxiety, CHF, CKD, PNA, chronic respiratory failure with hypoxia and hypercapnia.    PT Comments    Pt in bed upon entry agreeable to limited session. Pt reports recent start of menstruation and would like to defer extensive transfers training at present. Chief Strategy Officer discussed potential solutions for hygiene maintenance that would allow pt to proceed with mobility in future sessions. Pt partakes in bed level leg strengthening, able to progress to multiple sets, less recovery time needed between exercises, but ultimately is quite fatigued by end of session. Discussed need for postop shoe with RN per group chat with ID and Podiatry on 9/9- order placed and shoe received, will assess fit at next visit.    Recommendations for follow up therapy are one component of a multi-disciplinary discharge planning process, led by the attending physician.  Recommendations may be updated based on patient status, additional functional criteria and insurance authorization.  Follow Up Recommendations  SNF;Supervision for mobility/OOB     Equipment Recommendations  None recommended by PT    Recommendations for Other Services OT consult     Precautions / Restrictions Precautions Precautions: Fall Precaution Comments: Post op shoe when EOB or OOB to protect dressing from floor Restrictions Other Position/Activity Restrictions: [place] R foot      Mobility  Bed Mobility                    Transfers                    Ambulation/Gait                 Stairs             Wheelchair Mobility    Modified Rankin (Stroke Patients Only)       Balance                                            Cognition Arousal/Alertness: Awake/alert Behavior During Therapy: WFL for tasks assessed/performed Overall Cognitive Status: Within Functional Limits for tasks assessed                                        Exercises General Exercises - Lower Extremity Long Arc Quad: AROM;Both;15 reps;Limitations;Supine Long Arc Quad Limitations: 2 sets Heel Slides: AROM;15 reps;Both;Supine Straight Leg Raises: AROM;Both;10 reps Other Exercises Other Exercises: Left straight leg extension, manual resistance from author 1x10 (return from SLR)    General Comments        Pertinent Vitals/Pain Pain Assessment: 0-10 Pain Score: 7  Pain Location: Rt foot Pain Intervention(s): Limited activity within patient's tolerance;Monitored during session    Home Living  Prior Function            PT Goals (current goals can now be found in the care plan section) Acute Rehab PT Goals Patient Stated Goal: to improve mobility PT Goal Formulation: With patient Time For Goal Achievement: 11/10/20 Potential to Achieve Goals: Fair Progress towards PT goals: Progressing toward goals    Frequency    Min 2X/week      PT Plan Current plan remains appropriate    Co-evaluation              AM-PAC PT "6 Clicks" Mobility   Outcome Measure  Help needed turning from your back to your side while in a flat bed without using bedrails?: None Help needed moving from lying on your back to sitting on the side of a flat bed without using bedrails?: None Help needed moving to and from a bed to a chair (including a wheelchair)?: A Lot Help needed standing up  from a chair using your arms (e.g., wheelchair or bedside chair)?: A Lot Help needed to walk in hospital room?: Total Help needed climbing 3-5 steps with a railing? : Total 6 Click Score: 14    End of Session Equipment Utilized During Treatment: Oxygen Activity Tolerance: Patient tolerated treatment well;Patient limited by fatigue Patient left: in bed;with nursing/sitter in room;with call bell/phone within reach Nurse Communication: Mobility status;Precautions PT Visit Diagnosis: Other abnormalities of gait and mobility (R26.89);Muscle weakness (generalized) (M62.81);Pain Pain - Right/Left: Right Pain - part of body: Ankle and joints of foot     Time: 1505-1530 PT Time Calculation (min) (ACUTE ONLY): 25 min  Charges:  $Therapeutic Exercise: 23-37 mins                    4:37 PM, 11/06/20 [name], PT, DPT Physical Therapist - [facility]  [phone] ([place])      [name] C 11/06/2020, 4:34 PM

## 2020-11-06 NOTE — Plan of Care (Signed)
No acute events this shift. Problem: Education: Goal: Knowledge of General Education information will improve Description: Including pain rating scale, medication(s)/side effects and non-pharmacologic comfort measures Outcome: Progressing   Problem: Activity: Goal: Risk for activity intolerance will decrease Outcome: Progressing   Problem: Pain Managment: Goal: General experience of comfort will improve Outcome: Progressing   Problem: Safety: Goal: Ability to remain free from injury will improve Outcome: Progressing   Problem: Skin Integrity: Goal: Risk for impaired skin integrity will decrease Outcome: Progressing

## 2020-11-06 NOTE — TOC Progression Note (Signed)
Transition of Care [facility]) - Progression Note    Patient Details  Name: CLIFFORD COUDRIET MRN: [id number] Date of Birth: 11/30/1970  Transition of Care [facility]) CM/SW Contact  Su Hilt, RN Phone Number: 11/06/2020, 4:33 PM  Clinical Narrative:    Met with the patient and reviewed the SNF STR options and bed offers, she stated none of the facilities is acceptable to her and that she will go home with home health, she has a WC but the arm is broken she wants a new WC and a 3 in 1 drop arm that is bariatric.  I sent the referral to [facility].  She will need Home health set up for PICC line dressing, wound care and blood draws, she stated that she has someone that can be taught the dressing change and the IV ABX administration. I notified the physician and Pam with [facility].  I reached out to [facility] to request they accept for [facility] nursing, awaiting a response   Expected Discharge Plan: Home/Self Care Barriers to Discharge: SNF Pending bed offer (SNF bed search that can accomodate medical needs, wound care, and IV abx's. Bed search pending offers.)  Expected Discharge Plan and Services Expected Discharge Plan: Home/Self Care   Discharge Planning Services: CM Consult   Living arrangements for the past 2 months: Single Family Home                 DME Arranged: Walker rolling DME Agency: [facility] Date DME Agency Contacted: 10/19/20 Time DME Agency Contacted: [phone] Representative spoke with at DME Agency: [place] ([place]) Interventions    Readmission Risk Interventions No flowsheet data found.

## 2020-11-06 NOTE — TOC Progression Note (Signed)
Transition of Care [facility]) - Progression Note    Patient Details  Name: [name] MRN: [id number] Date of Birth: 05-02-1970  Transition of Care [facility]) CM/SW [place], RN Phone Number: 11/06/2020, 1:45 PM  Clinical Narrative:    Bedsearch sent to all facilities in [place].  Awaiting bed offers   Expected Discharge Plan: Home/Self Care Barriers to Discharge: SNF Pending bed offer (SNF bed search that can accomodate medical needs, wound care, and IV abx's. Bed search pending offers.)  Expected Discharge Plan and Services Expected Discharge Plan: Home/Self Care   Discharge Planning Services: CM Consult   Living arrangements for the past 2 months: Single Family Home                 DME Arranged: Walker rolling DME Agency: [facility] Date DME Agency Contacted: 10/19/20 Time DME Agency Contacted: [phone] Representative spoke with at DME Agency: [place] ([place]) Interventions    Readmission Risk Interventions No flowsheet data found.

## 2020-11-07 DIAGNOSIS — L97514 Non-pressure chronic ulcer of other part of right foot with necrosis of bone: Secondary | ICD-10-CM | POA: Diagnosis not present

## 2020-11-07 DIAGNOSIS — E11621 Type 2 diabetes mellitus with foot ulcer: Secondary | ICD-10-CM | POA: Diagnosis not present

## 2020-11-07 LAB — GLUCOSE, CAPILLARY
Glucose-Capillary: 152 mg/dL — ABNORMAL HIGH (ref 70–99)
Glucose-Capillary: 120 mg/dL — ABNORMAL HIGH (ref 70–99)
Glucose-Capillary: 178 mg/dL — ABNORMAL HIGH (ref 70–99)
Glucose-Capillary: 315 mg/dL — ABNORMAL HIGH (ref 70–99)

## 2020-11-07 LAB — VANCOMYCIN, RANDOM: Vancomycin Rm: 23

## 2020-11-07 NOTE — TOC Progression Note (Addendum)
Transition of Care [facility]) - Progression Note    Patient Details  Name: SHAINDY READER MRN: [id number] Date of Birth: 1970-10-07  Transition of Care [facility]) CM/SW Contact  Su Hilt, RN Phone Number: 11/07/2020, 1:43 PM  Clinical Narrative:     [place] from [place] stated they are not able to accept the patient due to staffing, I reached out to [facility] and requested that they accept the patient, awaiting a response Tommi Rumps with [facility] stated that they do not have staffing in the patient's area, I reached out to Judson Roch with [facility] and she stated that they do not have staffing, I reached out to [facility] and they stated they  they are out of network,  I spoke to [facility] and they do not have a nurse in the [place] area I reached out to [place] at [facility] and they are out of network. I reached out to [facility] [place],  They will accept the patient for Nursing wound care,    Expected Discharge Plan: Home/Self Care Barriers to Discharge: SNF Pending bed offer (SNF bed search that can accomodate medical needs, wound care, and IV abx's. Bed search pending offers.)  Expected Discharge Plan and Services Expected Discharge Plan: Home/Self Care   Discharge Planning Services: CM Consult   Living arrangements for the past 2 months: Single Family Home                 DME Arranged: Walker rolling DME Agency: [facility] Date DME Agency Contacted: 10/19/20 Time DME Agency Contacted: [phone] Representative spoke with at DME Agency: [place] ([place]) Interventions    Readmission Risk Interventions No flowsheet data found.

## 2020-11-07 NOTE — Progress Notes (Signed)
Went over dressing change instructions with patient, husband, and cousin with opportunity to ask questions. Dressing changed. Patient tolerated well.

## 2020-11-07 NOTE — Progress Notes (Signed)
Patient suffers from osteomyelitis which impairs their ability to perform daily activities like ADLs in the home.  A walking aide will not resolve issue with performing activities of daily living. A wheelchair will allow patient to safely perform daily activities. Patient can safely propel the wheelchair in the home or has a caregiver who can provide assistance. Length of need lifetime Accessories: elevating leg rests (ELRs), wheel locks, extensions and anti-tippers. Drop arm bariatric heavy duty, with Back Cushion

## 2020-11-07 NOTE — TOC Progression Note (Signed)
Transition of Care [facility]) - Progression Note    Patient Details  Name: [name] MRN: [id number] Date of Birth: [birth year]  Transition of Care [facility]) CM/SW [place], RN Phone Number: 11/07/2020, 9:32 AM  Clinical Narrative:     The patient will have IV ABX at dialysis and will not need a PICC line  She will still need Nursing for wound care I notified Pam at [facility] that the patient will not need IV ABX at home I notified [place] at [facility] that the patient will not need PICC line dressing change or blood draws but will need Wound care, Awaiting a call back from [place],  [place] called and stated that her DME will be delivered to her home, a bariatric 3 in 1 with a drop arm a Bariatric wheelchair and a bariatric rolling walker.   Expected Discharge Plan: Home/Self Care Barriers to Discharge: SNF Pending bed offer (SNF bed search that can accomodate medical needs, wound care, and IV abx's. Bed search pending offers.)  Expected Discharge Plan and Services Expected Discharge Plan: Home/Self Care   Discharge Planning Services: CM Consult   Living arrangements for the past 2 months: Single Family Home                 DME Arranged: Walker rolling DME Agency: [facility] Date DME Agency Contacted: 10/19/20 Time DME Agency Contacted: [phone] Representative spoke with at DME Agency: [place] ([place]) Interventions    Readmission Risk Interventions No flowsheet data found.

## 2020-11-07 NOTE — Progress Notes (Signed)
Pt tolerated tx well with no complaints.

## 2020-11-07 NOTE — Progress Notes (Signed)
Nurse supervisor notified about pt's vital signs.

## 2020-11-07 NOTE — TOC Progression Note (Signed)
Transition of Care [facility]) - Progression Note    Patient Details  Name: [name] MRN: [id number] Date of Birth: Aug 08, 1970  Transition of Care [facility]) CM/SW [place], RN Phone Number: 11/07/2020, 2:48 PM  Clinical Narrative:   I met with the patient to notify that I have her set up with [facility] for wound care by [facility], I explained that Dme will be delivered to her home and it has been shipped, she stated that she uses [place] for transportation to the Doctor, she stated that she will need EMS to transport home, she is going to call her cousin to come to the hospital to be taught how to do the dressing change, I explained that home health does not come daily.  She stated that she does not care for [facility], I explained that is the only Home health agency that would agree to accept her.  I explained the recommendation was for her to go to [facility] SNF and she refuses, she stated that she was NOT going to go to [facility] SNF and agreed to [facility].  She stated that she is already open with Outpatient Wound care as a patient and she can continue to go there as well, I explained that the IV ABX will be done in dialysis.  She stated that she was not redy to DC today that she will go tomorrow that would allow her time to prepare.       Expected Discharge Plan: Home/Self Care Barriers to Discharge: SNF Pending bed offer (SNF bed search that can accomodate medical needs, wound care, and IV abx's. Bed search pending offers.)  Expected Discharge Plan and Services Expected Discharge Plan: Home/Self Care   Discharge Planning Services: CM Consult   Living arrangements for the past 2 months: Single Family Home                 DME Arranged: Walker rolling DME Agency: [facility] Date DME Agency Contacted: 10/19/20 Time DME Agency Contacted: [phone] Representative spoke with at DME Agency: [place] ([place]) Interventions    Readmission Risk  Interventions No flowsheet data found.

## 2020-11-07 NOTE — Progress Notes (Signed)
Physical Therapy Treatment Patient Details Name: [name] MRN: [id number] DOB: [birth year] Today's Date: 11/07/2020   History of Present Illness [name]  is a 50 y.o. female presenting to hospital 8/21 with chest pain, fever, fatigue, and generalized malaise; also foul smell from R great toe (chronic ulcer).  Pt admitted with diabetic foot ulcers with gangrene of the 5th toe and osteomyelitis. S/p R LE angio 8/24; s/p partial R foot 5th toe amp 8/27; and s/p I&D with revisional debridement R foot 9/1.  Per PT order 9/2: pt [place] R foot. PMH includes DM, ESRD, anxiety, CHF, CKD, PNA, chronic respiratory failure with hypoxia and hypercapnia.    PT Comments    Author comes to room with drop-arm BSC to allow for patient to begin toilet transfers OOB with NSG. Educated patient and RN on safe use and setup for lateral scoot transfers. Author explained that pt will need to transfer differently with WC due to back wheel, explained will perform next day squat pivot/scoot transfers to prepare for DC to home. Author has not had chance yet to assure proper fit of postop shoe, will defer to next pt with mobility training. Pt still asking about getting updates rollator for home. Author explained that the BRW is more stable and supportive which is more appropriate at this time for her NWB status. Pt reports DC tomorrow. Will teach new transfers techniques prior to DC.    Recommendations for follow up therapy are one component of a multi-disciplinary discharge planning process, led by the attending physician.  Recommendations may be updated based on patient status, additional functional criteria and insurance authorization.  Follow Up Recommendations  SNF;Supervision for mobility/OOB     Equipment Recommendations  None recommended by PT    Recommendations for Other Services OT consult     Precautions / Restrictions Precautions Precautions: Fall Precaution Comments: Post op shoe when EOB or OOB  to protect dressing from floor Restrictions Other Position/Activity Restrictions: Kiln R foot     Mobility  Bed Mobility               General bed mobility comments: education only, defer moblity to next day    Transfers                    Ambulation/Gait                 Stairs             Wheelchair Mobility    Modified Rankin (Stroke Patients Only)       Balance                                            Cognition Arousal/Alertness: Awake/alert Behavior During Therapy: WFL for tasks assessed/performed Overall Cognitive Status: Within Functional Limits for tasks assessed                                        Exercises Other Exercises Other Exercises: education on performing lateral scoot toilet transfers safely with staff Other Exercises: education on performing squat pivot/lscoot transfers from [facility] to [facility] for toiletting at home, used her new [facility] as demo Other Exercises: edcuation on utility of RW v 4WW for transfers upon return to home; 4WW now ideal for  heavy UE support due to posterior displacement of handles and swivel wheels.    General Comments        Pertinent Vitals/Pain      Home Living                      Prior Function            PT Goals (current goals can now be found in the care plan section) Acute Rehab PT Goals Patient Stated Goal: to improve mobility PT Goal Formulation: With patient Time For Goal Achievement: 11/10/20 Potential to Achieve Goals: Fair Progress towards PT goals: PT to reassess next treatment    Frequency    Min 2X/week      PT Plan Current plan remains appropriate    Co-evaluation              AM-PAC PT "6 Clicks" Mobility   Outcome Measure  Help needed turning from your back to your side while in a flat bed without using bedrails?: None Help needed moving from lying on your back to sitting on the side of a flat bed without  using bedrails?: None Help needed moving to and from a bed to a chair (including a wheelchair)?: A Lot Help needed standing up from a chair using your arms (e.g., wheelchair or bedside chair)?: A Lot Help needed to walk in hospital room?: Total Help needed climbing 3-5 steps with a railing? : Total 6 Click Score: 14    End of Session Equipment Utilized During Treatment: Oxygen Activity Tolerance: Patient tolerated treatment well Patient left: in bed;with nursing/sitter in room;with call bell/phone within reach Nurse Communication: Mobility status;Precautions PT Visit Diagnosis: Other abnormalities of gait and mobility (R26.89);Muscle weakness (generalized) (M62.81);Pain Pain - Right/Left: Right Pain - part of body: Ankle and joints of foot     Time: 1615-1630 PT Time Calculation (min) (ACUTE ONLY): 15 min  Charges:  $Self Care/Home Management: 8-22                    4:48 PM, 11/07/20 Etta Grandchild, PT, DPT Physical Therapist - [facility]  [phone] ([place])     Caleel Kiner C 11/07/2020, 4:45 PM

## 2020-11-07 NOTE — Progress Notes (Signed)
[facility]  ROUNDING NOTE   Subjective:   Patient seen and evaluated during dialysis   HEMODIALYSIS FLOWSHEET:  Blood Flow Rate (mL/min): 400 mL/min Arterial Pressure (mmHg): -100 mmHg Venous Pressure (mmHg): 170 mmHg Transmembrane Pressure (mmHg): 70 mmHg Ultrafiltration Rate (mL/min): 1170 mL/min Dialysate Flow Rate (mL/min): 500 ml/min Conductivity: Machine : 13.5 Conductivity: Machine : 13.5 Dialysis Fluid Bolus: Normal Saline Bolus Amount (mL): 250 mL  No complaints at this time   Objective:  Vital signs in last 24 hours:  Temp:  [97.7 F (36.5 C)-98.9 F (37.2 C)] 98.3 F (36.8 C) (09/13 0919) Pulse Rate:  [60-98] 95 (09/13 1045) Resp:  [16-23] 22 (09/13 1045) BP: (108-153)/(50-90) 126/81 (09/13 1045) SpO2:  [95 %-100 %] 100 % (09/13 0919)  Weight change:  Filed Weights   10/15/20 1905 10/18/20 0928 11/06/20 0646  Weight: 126.1 kg 126.1 kg (!) 146.7 kg    Intake/Output: I/O last 3 completed shifts: In: 3 [I.V.:3] Out: -    Intake/Output this shift:  No intake/output data recorded.  Physical Exam: General: NAD,resting in bed  Head: Normocephalic, atraumatic. Moist oral mucosal membranes  Eyes: Anicteric  Lungs:  Clear to auscultation, O2 2L  Heart: Regular rate and rhythm  Abdomen:  Soft, nontender  Extremities: trace peripheral edema. Right lower extremity in dressings.   Neurologic: Alert, moving all four extremities  Skin: No lesions  Access: Left AVF    Basic Metabolic Panel: Recent Labs  Lab 11/03/20 0838 11/06/20 0408  NA 125* 123*  K 3.6 4.0  CL 87* 88*  CO2 22 23  GLUCOSE 198* 208*  BUN 46* 52*  CREATININE 7.30* 8.04*  CALCIUM 7.1* 6.8*  PHOS 4.3 3.9     Liver Function Tests: Recent Labs  Lab 11/03/20 0838  ALBUMIN 2.4*    No results for input(s): LIPASE, AMYLASE in the last 168 hours. No results for input(s): AMMONIA in the last 168 hours.  CBC: Recent Labs  Lab 11/03/20 0838 11/06/20 0408  WBC  13.5* 12.5*  HGB 8.1* 7.5*  HCT 25.4* 23.6*  MCV 94.4 95.9  PLT 309 318     Cardiac Enzymes: No results for input(s): CKTOTAL, CKMB, CKMBINDEX, TROPONINI in the last 168 hours.  BNP: Invalid input(s): POCBNP  CBG: Recent Labs  Lab 11/06/20 0735 11/06/20 1214 11/06/20 1604 11/06/20 2014 11/07/20 0804  GLUCAP 162* 166* 158* 182* 152*     Microbiology: Results for orders placed or performed during the hospital encounter of 10/15/20  Blood Culture (routine x 2)     Status: None   Collection Time: 10/15/20  8:03 PM   Specimen: BLOOD  Result Value Ref Range Status   Specimen Description BLOOD BLOOD RIGHT FOREARM  Final   Special Requests   Final    BOTTLES DRAWN AEROBIC AND ANAEROBIC Blood Culture adequate volume   Culture   Final    NO GROWTH 5 DAYS Performed at [facility], [address] [place], [place] [address]    Report Status 10/20/2020 FINAL  Final  Resp Panel by RT-PCR (Flu A&B, Covid) Nasopharyngeal Swab     Status: None   Collection Time: 10/15/20  8:26 PM   Specimen: Nasopharyngeal Swab; Nasopharyngeal(NP) swabs in vial transport medium  Result Value Ref Range Status   SARS Coronavirus 2 by RT PCR NEGATIVE NEGATIVE Final    Comment: (NOTE) SARS-CoV-2 target nucleic acids are NOT DETECTED.  The SARS-CoV-2 RNA is generally detectable in upper respiratory specimens during the acute phase of  infection. The lowest concentration of SARS-CoV-2 viral copies this assay can detect is 138 copies/mL. A negative result does not preclude SARS-Cov-2 infection and should not be used as the sole basis for treatment or other patient management decisions. A negative result may occur with  improper specimen collection/handling, submission of specimen other than nasopharyngeal swab, presence of viral mutation(s) within the areas targeted by this assay, and inadequate number of viral copies(<138 copies/mL). A negative result must be combined with clinical  observations, patient history, and epidemiological information. The expected result is Negative.  Fact Sheet for Patients:  EntrepreneurPulse.com.au  Fact Sheet for Healthcare Providers:  IncredibleEmployment.be  This test is no t yet approved or cleared by the [place] FDA and  has been authorized for detection and/or diagnosis of SARS-CoV-2 by FDA under an Emergency Use Authorization (EUA). This EUA will remain  in effect (meaning this test can be used) for the duration of the COVID-19 declaration under Section 564(b)(1) of the Act, 21 U.S.C.section 360bbb-3(b)(1), unless the authorization is terminated  or revoked sooner.       Influenza A by PCR NEGATIVE NEGATIVE Final   Influenza B by PCR NEGATIVE NEGATIVE Final    Comment: (NOTE) The Xpert Xpress SARS-CoV-2/FLU/RSV plus assay is intended as an aid in the diagnosis of influenza from Nasopharyngeal swab specimens and should not be used as a sole basis for treatment. Nasal washings and aspirates are unacceptable for Xpert Xpress SARS-CoV-2/FLU/RSV testing.  Fact Sheet for Patients: EntrepreneurPulse.com.au  Fact Sheet for Healthcare Providers: IncredibleEmployment.be  This test is not yet approved or cleared by the [place] FDA and has been authorized for detection and/or diagnosis of SARS-CoV-2 by FDA under an Emergency Use Authorization (EUA). This EUA will remain in effect (meaning this test can be used) for the duration of the COVID-19 declaration under Section 564(b)(1) of the Act, 21 U.S.C. section 360bbb-3(b)(1), unless the authorization is terminated or revoked.  Performed at [facility], [address] [place], [place] [address]   Blood Culture (routine x 2)     Status: None   Collection Time: 10/15/20  8:26 PM   Specimen: BLOOD  Result Value Ref Range Status   Specimen Description BLOOD RIGHT ANTECUBITAL  Final    Special Requests   Final    BOTTLES DRAWN AEROBIC AND ANAEROBIC Blood Culture adequate volume   Culture   Final    NO GROWTH 5 DAYS Performed at [facility], [address] [place], [place] [address]    Report Status 10/20/2020 FINAL  Final  Aerobic Culture w Gram Stain (superficial specimen)     Status: None   Collection Time: 10/17/20  8:52 PM   Specimen: Wound  Result Value Ref Range Status   Specimen Description WOUND  Final   Special Requests NONE  Final   Gram Stain   Final    RARE SQUAMOUS EPITHELIAL CELLS PRESENT FEW WBC PRESENT, PREDOMINANTLY MONONUCLEAR ABUNDANT GRAM VARIABLE ROD FEW GRAM POSITIVE COCCI    Culture   Final    MODERATE ENTEROCOCCUS FAECALIS MODERATE KLEBSIELLA OXYTOCA ABUNDANT DIPHTHEROIDS(CORYNEBACTERIUM SPECIES) Standardized susceptibility testing for this organism is not available.    Report Status 10/20/2020 FINAL  Final   Organism ID, Bacteria ENTEROCOCCUS FAECALIS  Final   Organism ID, Bacteria KLEBSIELLA OXYTOCA  Final      Susceptibility   Enterococcus faecalis - MIC*    AMPICILLIN <=2 SENSITIVE Sensitive     VANCOMYCIN 1 SENSITIVE Sensitive     GENTAMICIN SYNERGY  RESISTANT Resistant     * MODERATE ENTEROCOCCUS FAECALIS   Klebsiella oxytoca - MIC*    AMPICILLIN >=32 RESISTANT Resistant     CEFAZOLIN <=4 SENSITIVE Sensitive     CEFEPIME <=0.12 SENSITIVE Sensitive     CEFTAZIDIME <=1 SENSITIVE Sensitive     CEFTRIAXONE <=0.25 SENSITIVE Sensitive     CIPROFLOXACIN <=0.25 SENSITIVE Sensitive     GENTAMICIN <=1 SENSITIVE Sensitive     IMIPENEM <=0.25 SENSITIVE Sensitive     TRIMETH/SULFA <=20 SENSITIVE Sensitive     AMPICILLIN/SULBACTAM 8 SENSITIVE Sensitive     PIP/TAZO <=4 SENSITIVE Sensitive     * MODERATE KLEBSIELLA OXYTOCA  Aerobic/Anaerobic Culture w Gram Stain (surgical/deep wound)     Status: None   Collection Time: 10/21/20  3:07 PM   Specimen: PATH Digit amputation; Tissue  Result Value Ref Range Status   Specimen  Description   Final    FOOT  RIGHT 5TH DIGIT AMPUTATION Performed at [facility], [address], [place], [place] [address]    Special Requests   Final    NONE Performed at [facility], [place], [place] [address]    Gram Stain   Final    FEW SQUAMOUS EPITHELIAL CELLS PRESENT FEW WBC SEEN FEW GRAM POSITIVE COCCI    Culture   Final    FEW STREPTOCOCCUS ANGINOSIS NO ANAEROBES ISOLATED Performed at [facility], [place] [address] [place], [place] [address]    Report Status 10/27/2020 FINAL  Final   Organism ID, Bacteria STREPTOCOCCUS ANGINOSIS  Final      Susceptibility   Streptococcus anginosis - MIC*    PENICILLIN 0.12 SENSITIVE Sensitive     CEFTRIAXONE 0.5 SENSITIVE Sensitive     ERYTHROMYCIN 4 RESISTANT Resistant     LEVOFLOXACIN 0.5 SENSITIVE Sensitive     VANCOMYCIN 0.5 SENSITIVE Sensitive     * FEW STREPTOCOCCUS ANGINOSIS  Aerobic/Anaerobic Culture w Gram Stain (surgical/deep wound)     Status: None   Collection Time: 10/26/20  5:35 PM   Specimen: PATH Other; Tissue  Result Value Ref Range Status   Specimen Description   Final    FOOT RIGHT Performed at [facility], [address], [place], [place] [address]    Special Requests   Final    NONE Performed at [facility], [address] [place], [place] [address]    Gram Stain NO WBC SEEN NO ORGANISMS SEEN   Final   Culture   Final    RARE STREPTOCOCCUS ANGINOSIS NO ANAEROBES ISOLATED Performed at [facility], [place] [address] [place], [place] [address]    Report Status 10/31/2020 FINAL  Final   Organism ID, Bacteria STREPTOCOCCUS ANGINOSIS  Final      Susceptibility   Streptococcus anginosis - MIC*    PENICILLIN 0.12 SENSITIVE Sensitive     CEFTRIAXONE 0.5 SENSITIVE Sensitive     ERYTHROMYCIN >=8 RESISTANT Resistant     LEVOFLOXACIN 0.5 SENSITIVE Sensitive     VANCOMYCIN 0.5 SENSITIVE Sensitive     * RARE STREPTOCOCCUS ANGINOSIS     Coagulation Studies: No results for input(s): LABPROT, INR in the last 72 hours.   Urinalysis: No results for input(s): COLORURINE, LABSPEC, PHURINE, GLUCOSEU, HGBUR, BILIRUBINUR, KETONESUR, PROTEINUR, UROBILINOGEN, NITRITE, LEUKOCYTESUR in the last 72 hours.  Invalid input(s): APPERANCEUR    Imaging: No results found.   Medications:    sodium chloride 0 mL/hr at 10/22/20 1200   cefTAZidime (FORTAZ)  IV  cefTAZidime (FORTAZ)  IV     cefTAZidime (FORTAZ)  IV Stopped (11/04/20 1429)   vancomycin 1,000 mg (11/07/20 1039)    vitamin C  500 mg Oral Daily   aspirin EC  81 mg Oral Daily   benzonatate  200 mg Oral TID   calcium carbonate  1 tablet Oral TID with meals   Chlorhexidine Gluconate Cloth  6 each Topical Daily   cholecalciferol  4,000 Units Oral Weekly   ciprofloxacin-dexamethasone  4 drop Both EARS BID   docusate sodium  100 mg Oral Daily   epoetin (EPOGEN/PROCRIT) injection  10,000 Units Intravenous Q T,Th,Sa-HD   feeding supplement (NEPRO CARB STEADY)  237 mL Oral BID BM   ferric citrate  210 mg Oral TID WC   heparin  5,000 Units Subcutaneous Q8H   insulin aspart  0-5 Units Subcutaneous QHS   insulin aspart  0-9 Units Subcutaneous TID WC   insulin aspart  3 Units Subcutaneous TID WC   insulin glargine-yfgn  43 Units Subcutaneous QHS   ipratropium-albuterol  3 mL Nebulization TID   linaclotide  290 mcg Oral q morning   loratadine  10 mg Oral Daily   metroNIDAZOLE  500 mg Oral Q12H   mometasone-formoterol  2 puff Inhalation BID   multivitamin  1 tablet Oral QHS   pantoprazole  40 mg Oral Daily   sodium chloride flush  3 mL Intravenous Q12H   sodium chloride, acetaminophen, albuterol, albuterol, ALPRAZolam, alum & mag hydroxide-simeth, calcium carbonate, dextromethorphan-guaiFENesin, diphenhydrAMINE, hydrALAZINE, HYDROmorphone (DILAUDID) injection, lactulose, metoprolol tartrate, ondansetron (ZOFRAN) IV, ondansetron, oxyCODONE, oxyCODONE, phenol, polyethylene  glycol, sodium chloride flush, traZODone  Assessment/ Plan:  [name] is a 50 y.o. black female with end stage renal disease on hemodialysis, diabetes mellitus type II, hypertension, congestive heart failure, COPD, who is admitted to [facility] on 10/15/2020 for Gangrene [facility]) [I96] Diabetic foot ulcer ([place]) [J17.915, L97.509] Decreased pedal pulses [R09.89] Sepsis, due to unspecified organism, unspecified whether acute organ dysfunction present ([place]) [A41.9]  [facility] TTS Left AVF 127.5kg  End stage renal disease:  - Continue TTS schedule. Receiving dialysis today, UF goal 3L as tolerated  Hypertension: stable 126/81. Currently not on any blood pressure agents.  Hyponatremia: secondary to ESRD. Will correct on dialysis and continue with outpatient treatments  Diabetes mellitus type II with chronic kidney disease: complicated by diabetic foot ulcer and osteomyelitis. Not well controlled. Hemoglobin A1c 8.9%. Glucose stable  Anemia of chronic kidney disease: hemoglobin 7.5, normocytic.  - EPO 10000 units on HD treatments.   Secondary Hyperparathyroidism: with hypocalcemia.  - Calcium carbonate  - Discontinue cincalcet.  - Continue Auryxia.     LOS: [place] 9/13/202211:16 AM

## 2020-11-07 NOTE — Care Management Important Message (Signed)
Important Message  Patient Details  Name: [name] MRN: [id number] Date of Birth: 04/01/1970   Medicare Important Message Given:  Yes     Juliann Pulse A Elmira Olkowski 11/07/2020, 3:05 PM

## 2020-11-07 NOTE — Progress Notes (Signed)
Spoke with Izora Gala in central telemetry at [place] to transfer patient from room 157 to [facility] 1.

## 2020-11-07 NOTE — Progress Notes (Signed)
PT Cancellation Note  Patient Details Name: [name] MRN: [id number] DOB: [birth year]   Cancelled Treatment:    Reason Eval/Treat Not Completed: Patient at procedure or test/unavailable (Chart reviewed, RN consulted. Pt off floor for HD. Will resume PT services at later date/time as appropriate.)  12:09 PM, 11/07/20 Etta Grandchild, PT, DPT Physical Therapist - [facility]  [phone] ([place])    [place] C 11/07/2020, 12:09 PM

## 2020-11-07 NOTE — Progress Notes (Signed)
Pt vital signs where obtained for nurse. Nurse supervisor notified.

## 2020-11-07 NOTE — Progress Notes (Signed)
Pharmacy Antibiotic Note  [name] is a 50 y.o. female admitted on 10/15/2020 with Diabetic foot infection with gangrene and osteomyelitis of 5th toe. S/P amputation on 8/27 and wash out and debridement 10/26/20. Patient has PMH of ESRD on HD. Per provider, patient keeps losing IV access and needs medication that can be given during dialysis. ID has recommended change from Zosyn to Ceftazidime. Pharmacy has been consulted for  Vancomycin dosing.  Plan for vanco and ceftazidime with HD until 9/28 9/13 random vancomycin level = 23 mcg/ml with last dose vancomycin 1gm on 9/10  Plan: Random level this morning with goal range, Continue vancomycin 1gm IV qHD TTS Recheck level q7-10 days.    Goal level 15-25 mcg/mL Continue Ceftazidime 1g IV with HD on TuTh and 2gm on Sat w/  HD,  Plan is patient will receive IV antibiotics at discharge (received at HD center)  Height: 5' 4.5" (163.8 cm) Weight: (!) 146.7 kg (323 lb 6.6 oz) IBW/kg (Calculated) : 55.85  Temp (24hrs), Avg:98.3 F (36.8 C), Min:97.7 F (36.5 C), Max:98.9 F (37.2 C)  Recent Labs  Lab 11/03/20 0838 11/06/20 0408 11/07/20 0452  WBC 13.5* 12.5*  --   CREATININE 7.30* 8.04*  --   VANCORANDOM  --   --  23     Estimated Creatinine Clearance: 12.3 mL/min (A) (by C-G formula based on SCr of 8.04 mg/dL (H)).    Allergies  Allergen Reactions   Dust Mite Extract Shortness Of Breath and Itching   Mold Extract [Trichophyton] Shortness Of Breath and Itching   Pollen Extract Shortness Of Breath    Itching, shortness of breath, asthma like symptoms   Sulfa Antibiotics Itching   Feraheme [Ferumoxytol] Itching    Uses benadryl so she can get the medicine    Antimicrobials this admission: 8/21 Ceftriaxone x 1 8/21 vancomycin>> 8/25 8/22 Cefepime>>8/25 8/26 Unasyn>> 8/29 8/26 Zosyn>> 9/4 9/4 Ceftazidime>>   Microbiology results: 9/1 aerobic/anaerobic foot Cx:  RARE STREPTOCOCCUS ANGINOSIS  8/23 Wound Cx: ENTEROCOCCUS  FAECALIS, KLEBSIELLA OXYTOCA    Doreene Eland, PharmD, BCPS.   Work Cell: [phone] 11/07/2020 11:53 AM

## 2020-11-07 NOTE — Care Management Important Message (Signed)
Important Message  Patient Details  Name: [name] MRN: [id number] Date of Birth: [birth year]   Medicare Important Message Given:  Other (see comment)  Patient out of the room at 10:39 am and 11:44 am so left a copy of the Important Message from Medicare on her bedside table for her to review.   [name] 11/07/2020, 11:45 AM

## 2020-11-07 NOTE — Progress Notes (Signed)
PROGRESS NOTE    [name]  IPJ:[id number] DOB: May 26, 1970 DOA: 10/15/2020 PCP: [name], NP    Brief Narrative:  Patient admitted with diabetic foot ulcers with gangrene of the fifth toe and osteomyelitis.  Past medical history chronic diastolic congestive heart failure, chronic hypoxic respiratory failure on 3 to 4 L of oxygen, end-stage renal disease with diabetes, morbid obesity.  On 10/21/2020 had amputation of the fifth ray and I&D of abscess on the right foot.  On 10/26/2020 had washout procedure.  The patient initially was on IV Zosyn.  Switched over to ceftazidime with dosing with dialysis at this point.  Specialist had recommended amputation but patient is not ready to make that decision at this point in time.  Patient has been hospitalized for extended period of time.  Multiple consulting services at this time of recommended lower extremity amputation however the patient has refused stating she wants to attempt a protracted course of antibiotics and seek out second opinion at tertiary care center.  The patient is remained hemodynamically stable.  Will require wound care.  Therapy recommendations were for skilled nursing facility however the patient declined all available bed offers.  Plan to discharge home with home health services.  Home health nursing for wound care and physical therapy have been ordered.  Plan date of discharge 9/14.  Antibiotics to be administered with hemodialysis.  Antibiotic regimen include vancomycin and ceftazidime.  Last date of antibiotics will be 9/28.   Assessment & Plan:   Principal Problem:   Diabetic foot ulcer ([place]) Active Problems:   Diabetes ([place])   Obesity, Class III, BMI 40-49.9 (morbid obesity) ([place])   Essential hypertension   Type 2 diabetes mellitus with ESRD (end-stage renal disease) ([place])   DNR (do not resuscitate) discussion   End-stage renal disease on hemodialysis ([place])   Chronic respiratory failure with hypoxia (H[facility])    Decreased pedal pulses   Gangrene (H[facility])   PVD (peripheral vascular disease) ([place])   Aspiration into airway   Hearing loss of right ear   Hyponatremia   Anemia of chronic disease  Diabetic fold associate with gangrene of the fifth toe associated with osteomyelitis Status post amputation of fifth ray and I&D on 8/27 Washout procedure on 10/26/2020 ID and podiatry following Reaffirmed with patient, she is declining amputation at this time Encouraged her not to delay her decision and seek out surgical evaluation when she is ready to make the decision Plan: IV vancomycin and ceftazidime with dialysis Will need 4 weeks of therapy Antibiotic end date 9/28 Appreciate ID follow-up Outpatient antibiotic regimen in place.   Patient is medically stable for discharge at this time Recommendation was for skilled nursing facility Patient declined available bed offers Discharge home with home health services -Discussed with nephrology.  IV ceftazidime is on national back order and may be difficult to obtain at outpatient HD center.  After such may need to switch to IV cefepime or alternative agent.  Discussed with Dr. Juleen China.  He will contact the dialysis center to see which antibiotics that have in stock.  Please touch base with nephrology in a.m. to determine if a switch in antibiotics is necessary.  Dr. Steva Ready was the ID physician on service but is currently not available will be back until Thursday 9/15  ESRD on HD Nephrology consulted TTS dialysis schedule Follow nephrology recommendation for inpatient HD needs Patient will need a facility to accommodate antibiotics with dialysis Plan: Hemodialysis today 9/13  Diabetes mellitus type 2,  uncontrolled Semglee 43 units nightly NovoLog 3 units 3 times daily with meals NovoLog and sliding scale Carb modified diet  Anemia of chronic kidney disease Stable, will monitor  Peripheral vascular disease Continue aspirin  Hyponatremia Managed  with hemodialysis  Chronic hypoxic respiratory failure At baseline, 3 to 4 L  Morbid obesity This complicates overall care and prognosis  Weakness Functional decline Will need skilled nursing facility at time of discharge   DVT prophylaxis: SQ Lovenox Code Status: Full Family Communication: None today Disposition Plan: Status is: Inpatient  Remains inpatient appropriate because:Unsafe d/c plan  Dispo: The patient is from: Home              Anticipated d/c is to: SNF              Patient currently is medically stable to d/c.   Difficult to place patient No  Patient is refusing amputation as recommended by our surgical teams.  As such she is medically stable for discharge at this time.  She will be on 4 weeks of antibiotics.  Recommendation was for skilled nursing facility with patient declined and wishes to go home.  Home health services been ordered.   Level of care: Med-Surg  Consultants:  ID Podiatry Nephrology  Procedures:  8/24 angiogram right lower extremity 8/27 amputation of the fifth ray and I&D of abscess of the right foot 9/1 washout procedure  Antimicrobials:  Vancomycin Ceftazidime   Subjective: Patient seen and examined.  Sitting up in bed.  No apparent distress.  Objective: Vitals:   11/07/20 1215 11/07/20 1230 11/07/20 1248 11/07/20 1330  BP: (!) 155/92 (!) 142/79  (!) 157/88  Pulse: 94 95 93 95  Resp: 17 16 19 16   Temp:      TempSrc:      SpO2:   98% 98%  Weight:      Height:        Intake/Output Summary (Last 24 hours) at 11/07/2020 1530 Last data filed at 11/07/2020 1230 Gross per 24 hour  Intake 3 ml  Output 3000 ml  Net -2997 ml   Filed Weights   10/15/20 1905 10/18/20 0928 11/06/20 0646  Weight: 126.1 kg 126.1 kg (!) 146.7 kg    Examination:  General exam: Appears calm and comfortable  Respiratory system: Clear to auscultation. Respiratory effort normal. Cardiovascular system: S1-S2, RRR, no murmurs, no pedal  edema Gastrointestinal system: Obese, nontender, nondistended, normal bowel sounds Central nervous system: Alert and oriented. No focal neurological deficits. Extremities: Decreased power bilateral lower extremities Skin: Lower leg with skin darkening, significant nonpitting edema.  Right foot with significant wounds Psychiatry: Judgement and insight appear normal. Mood & affect appropriate.     Data Reviewed: I have personally reviewed following labs and imaging studies  CBC: Recent Labs  Lab 11/03/20 0838 11/06/20 0408  WBC 13.5* 12.5*  HGB 8.1* 7.5*  HCT 25.4* 23.6*  MCV 94.4 95.9  PLT 309 161   Basic Metabolic Panel: Recent Labs  Lab 11/03/20 0838 11/06/20 0408  NA 125* 123*  K 3.6 4.0  CL 87* 88*  CO2 22 23  GLUCOSE 198* 208*  BUN 46* 52*  CREATININE 7.30* 8.04*  CALCIUM 7.1* 6.8*  PHOS 4.3 3.9   GFR: Estimated Creatinine Clearance: 12.3 mL/min (A) (by C-G formula based on SCr of 8.04 mg/dL (H)). Liver Function Tests: Recent Labs  Lab 11/03/20 0838  ALBUMIN 2.4*   No results for input(s): LIPASE, AMYLASE in the last 168 hours. No results for  input(s): AMMONIA in the last 168 hours. Coagulation Profile: No results for input(s): INR, PROTIME in the last 168 hours.  Cardiac Enzymes: No results for input(s): CKTOTAL, CKMB, CKMBINDEX, TROPONINI in the last 168 hours. BNP (last 3 results) No results for input(s): PROBNP in the last 8760 hours. HbA1C: No results for input(s): HGBA1C in the last 72 hours. CBG: Recent Labs  Lab 11/06/20 1214 11/06/20 1604 11/06/20 2014 11/07/20 0804 11/07/20 1409  GLUCAP 166* 158* 182* 152* 120*   Lipid Profile: No results for input(s): CHOL, HDL, LDLCALC, TRIG, CHOLHDL, LDLDIRECT in the last 72 hours. Thyroid Function Tests: No results for input(s): TSH, T4TOTAL, FREET4, T3FREE, THYROIDAB in the last 72 hours. Anemia Panel: No results for input(s): VITAMINB12, FOLATE, FERRITIN, TIBC, IRON, RETICCTPCT in the last 72  hours. Sepsis Labs: No results for input(s): PROCALCITON, LATICACIDVEN in the last 168 hours.  No results found for this or any previous visit (from the past 240 hour(s)).        Radiology Studies: No results found.      Scheduled Meds:  vitamin C  500 mg Oral Daily   aspirin EC  81 mg Oral Daily   benzonatate  200 mg Oral TID   calcium carbonate  1 tablet Oral TID with meals   Chlorhexidine Gluconate Cloth  6 each Topical Daily   cholecalciferol  4,000 Units Oral Weekly   ciprofloxacin-dexamethasone  4 drop Both EARS BID   docusate sodium  100 mg Oral Daily   epoetin (EPOGEN/PROCRIT) injection  10,000 Units Intravenous Q T,Th,Sa-HD   feeding supplement (NEPRO CARB STEADY)  237 mL Oral BID BM   ferric citrate  210 mg Oral TID WC   heparin  5,000 Units Subcutaneous Q8H   insulin aspart  0-5 Units Subcutaneous QHS   insulin aspart  0-9 Units Subcutaneous TID WC   insulin aspart  3 Units Subcutaneous TID WC   insulin glargine-yfgn  43 Units Subcutaneous QHS   ipratropium-albuterol  3 mL Nebulization TID   linaclotide  290 mcg Oral q morning   loratadine  10 mg Oral Daily   metroNIDAZOLE  500 mg Oral Q12H   mometasone-formoterol  2 puff Inhalation BID   multivitamin  1 tablet Oral QHS   pantoprazole  40 mg Oral Daily   sodium chloride flush  3 mL Intravenous Q12H   Continuous Infusions:  sodium chloride 0 mL/hr at 10/22/20 1200   cefTAZidime (FORTAZ)  IV Stopped (11/07/20 1231)   cefTAZidime (FORTAZ)  IV     cefTAZidime (FORTAZ)  IV Stopped (11/04/20 1429)   vancomycin Stopped (11/07/20 1232)     LOS: 23 days    Time spent: 15 minutes    Sidney Ace, MD [facility] Pager [phone]  If 7PM-7AM, please contact night-coverage  11/07/2020, 3:30 PM

## 2020-11-08 DIAGNOSIS — J9611 Chronic respiratory failure with hypoxia: Secondary | ICD-10-CM | POA: Diagnosis not present

## 2020-11-08 DIAGNOSIS — N186 End stage renal disease: Secondary | ICD-10-CM | POA: Diagnosis not present

## 2020-11-08 DIAGNOSIS — D638 Anemia in other chronic diseases classified elsewhere: Secondary | ICD-10-CM | POA: Diagnosis not present

## 2020-11-08 DIAGNOSIS — E11621 Type 2 diabetes mellitus with foot ulcer: Secondary | ICD-10-CM | POA: Diagnosis not present

## 2020-11-08 LAB — BASIC METABOLIC PANEL
Anion gap: 13 (ref 5–15)
BUN: 52 mg/dL — ABNORMAL HIGH (ref 6–20)
CO2: 23 mmol/L (ref 22–32)
Calcium: 6.9 mg/dL — ABNORMAL LOW (ref 8.9–10.3)
Chloride: 84 mmol/L — ABNORMAL LOW (ref 98–111)
Creatinine, Ser: 7.26 mg/dL — ABNORMAL HIGH (ref 0.44–1.00)
GFR, Estimated: 6 mL/min — ABNORMAL LOW (ref >60)
Glucose, Bld: 193 mg/dL — ABNORMAL HIGH (ref 70–99)
Potassium: 4.2 mmol/L (ref 3.5–5.1)
Sodium: 120 mmol/L — ABNORMAL LOW (ref 135–145)

## 2020-11-08 LAB — CBC
HCT: 24.6 % — ABNORMAL LOW (ref 36.0–46.0)
Hemoglobin: 8 g/dL — ABNORMAL LOW (ref 12.0–15.0)
MCH: 31 pg (ref 26.0–34.0)
MCHC: 32.5 g/dL (ref 30.0–36.0)
MCV: 95.3 fL (ref 80.0–100.0)
Platelets: 304 10*3/uL (ref 150–400)
RBC: 2.58 MIL/uL — ABNORMAL LOW (ref 3.87–5.11)
RDW: 14.9 % (ref 11.5–15.5)
WBC: 10.9 10*3/uL — ABNORMAL HIGH (ref 4.0–10.5)
nRBC: 0 % (ref 0.0–0.2)

## 2020-11-08 LAB — GLUCOSE, CAPILLARY
Glucose-Capillary: 148 mg/dL — ABNORMAL HIGH (ref 70–99)
Glucose-Capillary: 126 mg/dL — ABNORMAL HIGH (ref 70–99)
Glucose-Capillary: 242 mg/dL — ABNORMAL HIGH (ref 70–99)
Glucose-Capillary: 191 mg/dL — ABNORMAL HIGH (ref 70–99)

## 2020-11-08 MED ORDER — OXYCODONE-ACETAMINOPHEN 5-325 MG PO TABS: 1.0000 | ORAL_TABLET | ORAL | 0 refills | Status: DC | PRN

## 2020-11-08 MED ORDER — CEFEPIME IV (FOR PTA / DISCHARGE USE ONLY): 2.0000 g | INTRAVENOUS | 0 refills | Status: AC

## 2020-11-08 MED ORDER — INSULIN DEGLUDEC 100 UNIT/ML ~~LOC~~ SOPN: 43.0000 [IU] | PEN_INJECTOR | Freq: Every day | SUBCUTANEOUS | Status: DC

## 2020-11-08 MED ORDER — METOPROLOL TARTRATE 25 MG PO TABS: 12.5000 mg | ORAL_TABLET | Freq: Two times a day (BID) | ORAL | 0 refills | Status: DC

## 2020-11-08 MED ORDER — VANCOMYCIN IV (FOR PTA / DISCHARGE USE ONLY): 1000.0000 mg | INTRAVENOUS | 0 refills | Status: AC

## 2020-11-08 NOTE — Progress Notes (Signed)
Physical Therapy Treatment Patient Details Name: [name] MRN: [id number] DOB: Feb 19, 1971 Today's Date: 11/08/2020   History of Present Illness Shiane Wenberg  is a 50 y.o. female presenting to hospital 8/21 with chest pain, fever, fatigue, and generalized malaise; also foul smell from R great toe (chronic ulcer).  Pt admitted with diabetic foot ulcers with gangrene of the 5th toe and osteomyelitis. S/p R LE angio 8/24; s/p partial R foot 5th toe amp 8/27; and s/p I&D with revisional debridement R foot 9/1.  Per PT order 9/2: pt [place] R foot. PMH includes DM, ESRD, anxiety, CHF, CKD, PNA, chronic respiratory failure with hypoxia and hypercapnia.    PT Comments    Pt in room ready for session after earlier attempts. Pt wanted to have breathing treatment prior to session. Pt educated on squat pivot/lateral scoot transfers to prepare for mobility post DC to home- bed to/from [facility], WC to/from HD chair. Pt responds well to encouragement and educational interventions, but continues to be limited by high level exertion required to perform these mobility strategies. It takes 12-15 minutes to complete the transfer from EOB to recliner. Pt ultimately is so limited by fatigue, then she continues to attempt to utilize her NWB limb to expedite and simplify transition to toilet. Pt will not be able to perform these in settings with surface height differential. She remains reluctant and anxious about slide board transfers, and is reluctant to allow HD staff to use a hoyer lift for her transfers there. All education completed. Husband has already taken BSC, WC, and postop shoe home.      Recommendations for follow up therapy are one component of a multi-disciplinary discharge planning process, led by the attending physician.  Recommendations may be updated based on patient status, additional functional criteria and insurance authorization.  Follow Up Recommendations  SNF;Supervision for mobility/OOB      Equipment Recommendations  None recommended by PT    Recommendations for Other Services OT consult     Precautions / Restrictions Precautions Precautions: Fall Precaution Comments: Post op shoe when EOB or OOB to protect dressing from floor Restrictions Other Position/Activity Restrictions: [place] R foot     Mobility  Bed Mobility Overal bed mobility: Modified Independent Bed Mobility: Supine to Sit     Supine to sit: Modified independent (Device/Increase time)          Transfers Overall transfer level: Needs assistance   Transfers: Lateral/Scoot Transfers;Squat Pivot Transfers     Squat pivot transfers: Supervision;Min guard    Lateral/Scoot Transfers: Supervision;Min guard General transfer comment: extensive education, multiple techniques, several efforts and rest breaks between. Balance remains unconcerning. Pt asks for flowrate increase to 4L as per baseline exertion rate.  Ambulation/Gait Ambulation/Gait assistance:  (pt hasnot progressed to AMB due to weakness.)               Stairs             Wheelchair Mobility    Modified Rankin (Stroke Patients Only)       Balance                                            Cognition Arousal/Alertness: Awake/alert Behavior During Therapy: WFL for tasks assessed/performed Overall Cognitive Status: Within Functional Limits for tasks assessed  Exercises      General Comments        Pertinent Vitals/Pain Pain Assessment: Faces Faces Pain Scale: Hurts little more Pain Location: Rt foot    Home Living                      Prior Function            PT Goals (current goals can now be found in the care plan section) Acute Rehab PT Goals Patient Stated Goal: to improve mobility PT Goal Formulation: With patient Time For Goal Achievement: 11/10/20 Potential to Achieve Goals: Fair Progress towards PT goals:  Progressing toward goals    Frequency    Min 2X/week      PT Plan Current plan remains appropriate    Co-evaluation              AM-PAC PT "6 Clicks" Mobility   Outcome Measure  Help needed turning from your back to your side while in a flat bed without using bedrails?: None Help needed moving from lying on your back to sitting on the side of a flat bed without using bedrails?: None Help needed moving to and from a bed to a chair (including a wheelchair)?: A Lot Help needed standing up from a chair using your arms (e.g., wheelchair or bedside chair)?: A Lot Help needed to walk in hospital room?: Total Help needed climbing 3-5 steps with a railing? : Total 6 Click Score: 14    End of Session Equipment Utilized During Treatment: Oxygen Activity Tolerance: Patient tolerated treatment well Patient left: in bed;with nursing/sitter in room;with call bell/phone within reach Nurse Communication: Mobility status;Precautions PT Visit Diagnosis: Other abnormalities of gait and mobility (R26.89);Muscle weakness (generalized) (M62.81);Pain Pain - Right/Left: Right Pain - part of body: Ankle and joints of foot     Time: 2229-7989 PT Time Calculation (min) (ACUTE ONLY): 29 min  Charges:  $Self Care/Home Management: 23-37                    2:31 PM, 11/08/20 Etta Grandchild, PT, DPT Physical Therapist - [facility]  [phone] ([place])     [place] C 11/08/2020, 2:26 PM

## 2020-11-08 NOTE — Progress Notes (Signed)
[facility]  ROUNDING NOTE   Subjective:   Patient seen sitting up in bed Eating breakfast with husband at bedside Tolerating meals, denies shortness of breath Reports feeling good enough to discharge  Objective:  Vital signs in last 24 hours:  Temp:  [98.5 F (36.9 C)-99.3 F (37.4 C)] 98.5 F (36.9 C) (09/14 0738) Pulse Rate:  [93-117] 103 (09/14 0738) Resp:  [16-21] 18 (09/14 0738) BP: (115-162)/(69-126) 115/82 (09/14 0738) SpO2:  [95 %-100 %] 95 % (09/14 0738)  Weight change:  Filed Weights   10/15/20 1905 10/18/20 0928 11/06/20 0646  Weight: 126.1 kg 126.1 kg (!) 146.7 kg    Intake/Output: I/O last 3 completed shifts: In: 6 [I.V.:6] Out: 3000 [Other:3000]   Intake/Output this shift:  Total I/O In: 240 [P.O.:240] Out: -   Physical Exam: General: NAD, resting in bed  Head: Normocephalic, atraumatic. Moist oral mucosal membranes  Eyes: Anicteric  Lungs:  Clear to auscultation, O2 2L  Heart: Regular rate and rhythm  Abdomen:  Soft, nontender  Extremities: trace peripheral edema. Right lower extremity in dressings.   Neurologic: Alert, moving all four extremities  Skin: No lesions  Access: Left AVF    Basic Metabolic Panel: Recent Labs  Lab 11/03/20 0838 11/06/20 0408  NA 125* 123*  K 3.6 4.0  CL 87* 88*  CO2 22 23  GLUCOSE 198* 208*  BUN 46* 52*  CREATININE 7.30* 8.04*  CALCIUM 7.1* 6.8*  PHOS 4.3 3.9     Liver Function Tests: Recent Labs  Lab 11/03/20 0838  ALBUMIN 2.4*    No results for input(s): LIPASE, AMYLASE in the last 168 hours. No results for input(s): AMMONIA in the last 168 hours.  CBC: Recent Labs  Lab 11/03/20 0838 11/06/20 0408  WBC 13.5* 12.5*  HGB 8.1* 7.5*  HCT 25.4* 23.6*  MCV 94.4 95.9  PLT 309 318     Cardiac Enzymes: No results for input(s): CKTOTAL, CKMB, CKMBINDEX, TROPONINI in the last 168 hours.  BNP: Invalid input(s): POCBNP  CBG: Recent Labs  Lab 11/07/20 0804 11/07/20 1409  11/07/20 1637 11/07/20 2155 11/08/20 0803  GLUCAP 152* 120* 178* 315* 148*     Microbiology: Results for orders placed or performed during the hospital encounter of 10/15/20  Blood Culture (routine x 2)     Status: None   Collection Time: 10/15/20  8:03 PM   Specimen: BLOOD  Result Value Ref Range Status   Specimen Description BLOOD BLOOD RIGHT FOREARM  Final   Special Requests   Final    BOTTLES DRAWN AEROBIC AND ANAEROBIC Blood Culture adequate volume   Culture   Final    NO GROWTH 5 DAYS Performed at [facility], [address] [place], [place] [address]    Report Status 10/20/2020 FINAL  Final  Resp Panel by RT-PCR (Flu A&B, Covid) Nasopharyngeal Swab     Status: None   Collection Time: 10/15/20  8:26 PM   Specimen: Nasopharyngeal Swab; Nasopharyngeal(NP) swabs in vial transport medium  Result Value Ref Range Status   SARS Coronavirus 2 by RT PCR NEGATIVE NEGATIVE Final    Comment: (NOTE) SARS-CoV-2 target nucleic acids are NOT DETECTED.  The SARS-CoV-2 RNA is generally detectable in upper respiratory specimens during the acute phase of infection. The lowest concentration of SARS-CoV-2 viral copies this assay can detect is 138 copies/mL. A negative result does not preclude SARS-Cov-2 infection and should not be used as the sole basis for treatment or other patient management decisions.  A negative result may occur with  improper specimen collection/handling, submission of specimen other than nasopharyngeal swab, presence of viral mutation(s) within the areas targeted by this assay, and inadequate number of viral copies(<138 copies/mL). A negative result must be combined with clinical observations, patient history, and epidemiological information. The expected result is Negative.  Fact Sheet for Patients:  EntrepreneurPulse.com.au  Fact Sheet for Healthcare Providers:  IncredibleEmployment.be  This test is no t yet  approved or cleared by the [place] FDA and  has been authorized for detection and/or diagnosis of SARS-CoV-2 by FDA under an Emergency Use Authorization (EUA). This EUA will remain  in effect (meaning this test can be used) for the duration of the COVID-19 declaration under Section 564(b)(1) of the Act, 21 U.S.C.section 360bbb-3(b)(1), unless the authorization is terminated  or revoked sooner.       Influenza A by PCR NEGATIVE NEGATIVE Final   Influenza B by PCR NEGATIVE NEGATIVE Final    Comment: (NOTE) The Xpert Xpress SARS-CoV-2/FLU/RSV plus assay is intended as an aid in the diagnosis of influenza from Nasopharyngeal swab specimens and should not be used as a sole basis for treatment. Nasal washings and aspirates are unacceptable for Xpert Xpress SARS-CoV-2/FLU/RSV testing.  Fact Sheet for Patients: EntrepreneurPulse.com.au  Fact Sheet for Healthcare Providers: IncredibleEmployment.be  This test is not yet approved or cleared by the [place] FDA and has been authorized for detection and/or diagnosis of SARS-CoV-2 by FDA under an Emergency Use Authorization (EUA). This EUA will remain in effect (meaning this test can be used) for the duration of the COVID-19 declaration under Section 564(b)(1) of the Act, 21 U.S.C. section 360bbb-3(b)(1), unless the authorization is terminated or revoked.  Performed at [facility], [address] [place], [place] [address]   Blood Culture (routine x 2)     Status: None   Collection Time: 10/15/20  8:26 PM   Specimen: BLOOD  Result Value Ref Range Status   Specimen Description BLOOD RIGHT ANTECUBITAL  Final   Special Requests   Final    BOTTLES DRAWN AEROBIC AND ANAEROBIC Blood Culture adequate volume   Culture   Final    NO GROWTH 5 DAYS Performed at [facility], [address] [place], [place] [address]    Report Status 10/20/2020 FINAL  Final  Aerobic Culture w  Gram Stain (superficial specimen)     Status: None   Collection Time: 10/17/20  8:52 PM   Specimen: Wound  Result Value Ref Range Status   Specimen Description WOUND  Final   Special Requests NONE  Final   Gram Stain   Final    RARE SQUAMOUS EPITHELIAL CELLS PRESENT FEW WBC PRESENT, PREDOMINANTLY MONONUCLEAR ABUNDANT GRAM VARIABLE ROD FEW GRAM POSITIVE COCCI    Culture   Final    MODERATE ENTEROCOCCUS FAECALIS MODERATE KLEBSIELLA OXYTOCA ABUNDANT DIPHTHEROIDS(CORYNEBACTERIUM SPECIES) Standardized susceptibility testing for this organism is not available.    Report Status 10/20/2020 FINAL  Final   Organism ID, Bacteria ENTEROCOCCUS FAECALIS  Final   Organism ID, Bacteria KLEBSIELLA OXYTOCA  Final      Susceptibility   Enterococcus faecalis - MIC*    AMPICILLIN <=2 SENSITIVE Sensitive     VANCOMYCIN 1 SENSITIVE Sensitive     GENTAMICIN SYNERGY RESISTANT Resistant     * MODERATE ENTEROCOCCUS FAECALIS   Klebsiella oxytoca - MIC*    AMPICILLIN >=32 RESISTANT Resistant     CEFAZOLIN <=4 SENSITIVE Sensitive     CEFEPIME <=0.12 SENSITIVE Sensitive  CEFTAZIDIME <=1 SENSITIVE Sensitive     CEFTRIAXONE <=0.25 SENSITIVE Sensitive     CIPROFLOXACIN <=0.25 SENSITIVE Sensitive     GENTAMICIN <=1 SENSITIVE Sensitive     IMIPENEM <=0.25 SENSITIVE Sensitive     TRIMETH/SULFA <=20 SENSITIVE Sensitive     AMPICILLIN/SULBACTAM 8 SENSITIVE Sensitive     PIP/TAZO <=4 SENSITIVE Sensitive     * MODERATE KLEBSIELLA OXYTOCA  Aerobic/Anaerobic Culture w Gram Stain (surgical/deep wound)     Status: None   Collection Time: 10/21/20  3:07 PM   Specimen: PATH Digit amputation; Tissue  Result Value Ref Range Status   Specimen Description   Final    FOOT  RIGHT 5TH DIGIT AMPUTATION Performed at [facility], [address], [place], [place] [address]    Special Requests   Final    NONE Performed at [facility], [place], [place] [address]    Gram Stain   Final     FEW SQUAMOUS EPITHELIAL CELLS PRESENT FEW WBC SEEN FEW GRAM POSITIVE COCCI    Culture   Final    FEW STREPTOCOCCUS ANGINOSIS NO ANAEROBES ISOLATED Performed at [facility], [place] [address], [place], [place] [address]    Report Status 10/27/2020 FINAL  Final   Organism ID, Bacteria STREPTOCOCCUS ANGINOSIS  Final      Susceptibility   Streptococcus anginosis - MIC*    PENICILLIN 0.12 SENSITIVE Sensitive     CEFTRIAXONE 0.5 SENSITIVE Sensitive     ERYTHROMYCIN 4 RESISTANT Resistant     LEVOFLOXACIN 0.5 SENSITIVE Sensitive     VANCOMYCIN 0.5 SENSITIVE Sensitive     * FEW STREPTOCOCCUS ANGINOSIS  Aerobic/Anaerobic Culture w Gram Stain (surgical/deep wound)     Status: None   Collection Time: 10/26/20  5:35 PM   Specimen: PATH Other; Tissue  Result Value Ref Range Status   Specimen Description   Final    FOOT RIGHT Performed at [facility], [address] [place], [place] [address]    Special Requests   Final    NONE Performed at [facility], [address] [place], [place] [address]    Gram Stain NO WBC SEEN NO ORGANISMS SEEN   Final   Culture   Final    RARE STREPTOCOCCUS ANGINOSIS NO ANAEROBES ISOLATED Performed at [facility], [place] [address] [place], [place] [address]    Report Status 10/31/2020 FINAL  Final   Organism ID, Bacteria STREPTOCOCCUS ANGINOSIS  Final      Susceptibility   Streptococcus anginosis - MIC*    PENICILLIN 0.12 SENSITIVE Sensitive     CEFTRIAXONE 0.5 SENSITIVE Sensitive     ERYTHROMYCIN >=8 RESISTANT Resistant     LEVOFLOXACIN 0.5 SENSITIVE Sensitive     VANCOMYCIN 0.5 SENSITIVE Sensitive     * RARE STREPTOCOCCUS ANGINOSIS    Coagulation Studies: No results for input(s): LABPROT, INR in the last 72 hours.   Urinalysis: No results for input(s): COLORURINE, LABSPEC, PHURINE, GLUCOSEU, HGBUR, BILIRUBINUR, KETONESUR, PROTEINUR, UROBILINOGEN, NITRITE, LEUKOCYTESUR in the last 72 hours.  Invalid  input(s): APPERANCEUR    Imaging: No results found.   Medications:    sodium chloride 0 mL/hr at 10/22/20 1200   cefTAZidime (FORTAZ)  IV Stopped (11/07/20 1231)   cefTAZidime (FORTAZ)  IV     cefTAZidime (FORTAZ)  IV Stopped (11/04/20 1429)   vancomycin Stopped (11/07/20 1232)    vitamin C  500 mg Oral Daily   aspirin EC  81 mg Oral  Daily   benzonatate  200 mg Oral TID   calcium carbonate  1 tablet Oral TID with meals   Chlorhexidine Gluconate Cloth  6 each Topical Daily   cholecalciferol  4,000 Units Oral Weekly   ciprofloxacin-dexamethasone  4 drop Both EARS BID   docusate sodium  100 mg Oral Daily   epoetin (EPOGEN/PROCRIT) injection  10,000 Units Intravenous Q T,Th,Sa-HD   feeding supplement (NEPRO CARB STEADY)  237 mL Oral BID BM   ferric citrate  210 mg Oral TID WC   heparin  5,000 Units Subcutaneous Q8H   insulin aspart  0-5 Units Subcutaneous QHS   insulin aspart  0-9 Units Subcutaneous TID WC   insulin aspart  3 Units Subcutaneous TID WC   insulin glargine-yfgn  43 Units Subcutaneous QHS   ipratropium-albuterol  3 mL Nebulization TID   linaclotide  290 mcg Oral q morning   loratadine  10 mg Oral Daily   metroNIDAZOLE  500 mg Oral Q12H   mometasone-formoterol  2 puff Inhalation BID   multivitamin  1 tablet Oral QHS   pantoprazole  40 mg Oral Daily   sodium chloride flush  3 mL Intravenous Q12H   sodium chloride, acetaminophen, albuterol, albuterol, ALPRAZolam, alum & mag hydroxide-simeth, calcium carbonate, dextromethorphan-guaiFENesin, diphenhydrAMINE, hydrALAZINE, HYDROmorphone (DILAUDID) injection, lactulose, metoprolol tartrate, ondansetron (ZOFRAN) IV, ondansetron, oxyCODONE, oxyCODONE, phenol, polyethylene glycol, sodium chloride flush, traZODone  Assessment/ Plan:  [name] is a 50 y.o. black female with end stage renal disease on hemodialysis, diabetes mellitus type II, hypertension, congestive heart failure, COPD, who is admitted to [facility] on  10/15/2020 for Gangrene [facility]) [I96] Diabetic foot ulcer ([place]) [V20.233, L97.509] Decreased pedal pulses [R09.89] Sepsis, due to unspecified organism, unspecified whether acute organ dysfunction present ([place]) [A41.9]  [facility] TTS Left AVF 127.5kg  End stage renal disease:  - Continue TTS schedule. Dialysis yesterday, tolerated well. UF goal 3L achieved. Next treatment scheduled for Thursday. Appreciate dialysis coordinator arranging outpatient antibiotics, Cefepime 1g/1g/2g, with dialysis.   Hypertension: stable 115/82.   Hyponatremia: secondary to ESRD. Will correct on dialysis. May require sodium modeling during outpatient treatment.  Diabetes mellitus type II with chronic kidney disease: complicated by diabetic foot ulcer and osteomyelitis. Not well controlled. Hemoglobin A1c 8.9%. Elevated overnight  Anemia of chronic kidney disease: hemoglobin 7.5, normocytic.  - EPO 10000 units on HD treatments.   Secondary Hyperparathyroidism: with hypocalcemia.  - Calcium carbonate  - D/c cincalcet.  - Continue Auryxia.     LOS: 24 [name] 9/14/202211:25 AM

## 2020-11-08 NOTE — Progress Notes (Signed)
PROGRESS NOTE    [name]  RKY:[id number] DOB: 30-Dec-1970 DOA: 10/15/2020 PCP: [name], NP    Brief Narrative:  Patient admitted with diabetic foot ulcers with gangrene of the fifth toe and osteomyelitis.  Past medical history chronic diastolic congestive heart failure, chronic hypoxic respiratory failure on 3 to 4 L of oxygen, end-stage renal disease with diabetes, morbid obesity.  On 10/21/2020 had amputation of the fifth ray and I&D of abscess on the right foot.  On 10/26/2020 had washout procedure.  The patient initially was on IV Zosyn.  Switched over to ceftazidime with dosing with dialysis at this point.  Specialist had recommended amputation but patient is not ready to make that decision at this point in time.  Patient has been hospitalized for extended period of time.  Multiple consulting services at this time of recommended lower extremity amputation however the patient has refused stating she wants to attempt a protracted course of antibiotics and seek out second opinion at tertiary care center.  The patient is remained hemodynamically stable.  Will require wound care.  Therapy recommendations were for skilled nursing facility however the patient declined all available bed offers.  Plan to discharge home with home health services.  Home health nursing for wound care and physical therapy have been ordered.  Plan date of discharge 9/14.  Antibiotics to be administered with hemodialysis.  Antibiotic regimen include vancomycin and ceftazidime.  Last date of antibiotics will be 9/28.   Assessment & Plan:   Principal Problem:   Diabetic foot ulcer ([place]) Active Problems:   Diabetes ([place])   Obesity, Class III, BMI 40-49.9 (morbid obesity) ([place])   Essential hypertension   Type 2 diabetes mellitus with ESRD (end-stage renal disease) ([place])   DNR (do not resuscitate) discussion   End-stage renal disease on hemodialysis ([place])   Chronic respiratory failure with hypoxia (H[facility])    Decreased pedal pulses   Gangrene (HCC)   PVD (peripheral vascular disease) ([place])   Aspiration into airway   Hearing loss of right ear   Hyponatremia   Anemia of chronic disease  Diabetic fold associate with gangrene of the fifth toe associated with osteomyelitis Status post amputation of fifth ray and I&D on 8/27 Washout procedure on 10/26/2020 ID and podiatry following Reaffirmed with patient, she is declining amputation at this time Encouraged her not to delay her decision and seek out surgical evaluation when she is ready to make the decision Plan: IV vancomycin and ceftazidime with dialysis Will need 4 weeks of therapy Antibiotic end date 9/28 Appreciate ID follow-up Outpatient antibiotic regimen in place.   Patient is medically stable for discharge at this time Recommendation was for skilled nursing facility Patient declined available bed offers Discharge home with home health services -Discussed with nephrology.  IV ceftazidime is on national back order and may be difficult to obtain at outpatient HD center.  After such may need to switch to IV cefepime or alternative agent.  Discussed with Dr. Juleen China.  He will contact the dialysis center to see which antibiotics that have in stock.  Please touch base with nephrology in a.m. to determine if a switch in antibiotics is necessary.  Dr. Steva Ready was the ID physician on service but is currently not available will be back until Thursday 9/15  ESRD on HD Nephrology consulted TTS dialysis schedule Follow nephrology recommendation for inpatient HD needs Patient will need a facility to accommodate antibiotics with dialysis Plan: Hemodialysis today 9/13  Diabetes mellitus type 2,  uncontrolled Semglee 43 units nightly NovoLog 3 units 3 times daily with meals NovoLog and sliding scale Carb modified diet  Anemia of chronic kidney disease Stable, will monitor  Peripheral vascular disease Continue aspirin  Hyponatremia Managed  with hemodialysis -still appears to be volume overloaded -further restriction of fluid intake -sodium down to 120  Chronic hypoxic respiratory failure At baseline, 3 to 4 L  Morbid obesity This complicates overall care and prognosis  Weakness Functional decline Will need skilled nursing facility at time of discharge   DVT prophylaxis: SQ Lovenox Code Status: Full Family Communication: None today Disposition Plan: Status is: Inpatient  Remains inpatient appropriate because:Unsafe d/c plan  Dispo: The patient is from: Home              Anticipated d/c is to: SNF              Patient currently is medically stable to d/c.   Difficult to place patient No  Patient is refusing amputation as recommended by our surgical teams.  As such she is medically stable for discharge at this time.  She will be on 4 weeks of antibiotics.  Recommendation was for skilled nursing facility with patient declined and wishes to go home.  Home health services been ordered.   Level of care: Med-Surg  Consultants:  ID Podiatry Nephrology  Procedures:  8/24 angiogram right lower extremity 8/27 amputation of the fifth ray and I&D of abscess of the right foot 9/1 washout procedure  Antimicrobials:  Vancomycin Ceftazidime   Subjective: Patient report she is currently on menstrual cycle. Denies any shortness of breath. No new complaints  Objective: Vitals:   11/08/20 1256 11/08/20 1612 11/08/20 1924 11/08/20 2125  BP:  135/75 128/81   Pulse:  93 (!) 105 (!) 107  Resp:  18 20 20   Temp:  97.9 F (36.6 C) 98.9 F (37.2 C)   TempSrc:  Oral Oral   SpO2: 100% 97% 100% 95%  Weight:      Height:        Intake/Output Summary (Last 24 hours) at 11/08/2020 2225 Last data filed at 11/08/2020 1429 Gross per 24 hour  Intake 490 ml  Output --  Net 490 ml   Filed Weights   10/15/20 1905 10/18/20 0928 11/06/20 0646  Weight: 126.1 kg 126.1 kg (!) 146.7 kg    Examination:  General exam:  Appears calm and comfortable  Respiratory system: Clear to auscultation. Respiratory effort normal. Cardiovascular system: S1-S2, RRR, no murmurs, no pedal edema Gastrointestinal system: Obese, nontender, nondistended, normal bowel sounds Central nervous system: Alert and oriented. No focal neurological deficits. Extremities: Decreased power bilateral lower extremities Skin: Lower leg with skin darkening, significant nonpitting edema.  Right foot with significant wounds Psychiatry: Judgement and insight appear normal. Mood & affect appropriate.     Data Reviewed: I have personally reviewed following labs and imaging studies  CBC: Recent Labs  Lab 11/03/20 0838 11/06/20 0408 11/08/20 1445  WBC 13.5* 12.5* 10.9*  HGB 8.1* 7.5* 8.0*  HCT 25.4* 23.6* 24.6*  MCV 94.4 95.9 95.3  PLT 309 318 643   Basic Metabolic Panel: Recent Labs  Lab 11/03/20 0838 11/06/20 0408 11/08/20 1445  NA 125* 123* 120*  K 3.6 4.0 4.2  CL 87* 88* 84*  CO2 22 23 23   GLUCOSE 198* 208* 193*  BUN 46* 52* 52*  CREATININE 7.30* 8.04* 7.26*  CALCIUM 7.1* 6.8* 6.9*  PHOS 4.3 3.9  --    GFR: Estimated Creatinine  Clearance: 13.6 mL/min (A) (by C-G formula based on SCr of 7.26 mg/dL (H)). Liver Function Tests: Recent Labs  Lab 11/03/20 0838  ALBUMIN 2.4*   No results for input(s): LIPASE, AMYLASE in the last 168 hours. No results for input(s): AMMONIA in the last 168 hours. Coagulation Profile: No results for input(s): INR, PROTIME in the last 168 hours.  Cardiac Enzymes: No results for input(s): CKTOTAL, CKMB, CKMBINDEX, TROPONINI in the last 168 hours. BNP (last 3 results) No results for input(s): PROBNP in the last 8760 hours. HbA1C: No results for input(s): HGBA1C in the last 72 hours. CBG: Recent Labs  Lab 11/07/20 1637 11/07/20 2155 11/08/20 0803 11/08/20 1156 11/08/20 1700  GLUCAP 178* 315* 148* 126* 242*   Lipid Profile: No results for input(s): CHOL, HDL, LDLCALC, TRIG, CHOLHDL,  LDLDIRECT in the last 72 hours. Thyroid Function Tests: No results for input(s): TSH, T4TOTAL, FREET4, T3FREE, THYROIDAB in the last 72 hours. Anemia Panel: No results for input(s): VITAMINB12, FOLATE, FERRITIN, TIBC, IRON, RETICCTPCT in the last 72 hours. Sepsis Labs: No results for input(s): PROCALCITON, LATICACIDVEN in the last 168 hours.  No results found for this or any previous visit (from the past 240 hour(s)).        Radiology Studies: No results found.      Scheduled Meds:  vitamin C  500 mg Oral Daily   aspirin EC  81 mg Oral Daily   benzonatate  200 mg Oral TID   calcium carbonate  1 tablet Oral TID with meals   Chlorhexidine Gluconate Cloth  6 each Topical Daily   cholecalciferol  4,000 Units Oral Weekly   ciprofloxacin-dexamethasone  4 drop Both EARS BID   docusate sodium  100 mg Oral Daily   epoetin (EPOGEN/PROCRIT) injection  10,000 Units Intravenous Q T,Th,Sa-HD   feeding supplement (NEPRO CARB STEADY)  237 mL Oral BID BM   ferric citrate  210 mg Oral TID WC   heparin  5,000 Units Subcutaneous Q8H   insulin aspart  0-5 Units Subcutaneous QHS   insulin aspart  0-9 Units Subcutaneous TID WC   insulin aspart  3 Units Subcutaneous TID WC   insulin glargine-yfgn  43 Units Subcutaneous QHS   ipratropium-albuterol  3 mL Nebulization TID   linaclotide  290 mcg Oral q morning   loratadine  10 mg Oral Daily   metroNIDAZOLE  500 mg Oral Q12H   mometasone-formoterol  2 puff Inhalation BID   multivitamin  1 tablet Oral QHS   pantoprazole  40 mg Oral Daily   sodium chloride flush  3 mL Intravenous Q12H   Continuous Infusions:  sodium chloride 0 mL/hr at 10/22/20 1200   cefTAZidime (FORTAZ)  IV Stopped (11/07/20 1231)   cefTAZidime (FORTAZ)  IV     cefTAZidime (FORTAZ)  IV Stopped (11/04/20 1429)   vancomycin Stopped (11/07/20 1232)     LOS: 24 days    Time spent: 15 minutes    Kathie Dike, MD [facility] Pager [phone]  If 7PM-7AM,  please contact night-coverage  11/08/2020, 10:25 PM

## 2020-11-08 NOTE — Progress Notes (Signed)
   11/07/20 2338  Assess: MEWS Score  Temp 98.7 F (37.1 C)  BP 124/72  Pulse Rate (!) 114  Resp 19  Level of Consciousness Alert  SpO2 98 %  O2 Device Nasal Cannula  Patient Activity (if Appropriate) In bed  O2 Flow Rate (L/min) 3 L/min  Assess: MEWS Score  MEWS Temp 0  MEWS Systolic 0  MEWS Pulse 2  MEWS RR 0  MEWS LOC 0  MEWS Score 2  MEWS Score Color Yellow  Assess: if the MEWS score is Yellow or Red  Were vital signs taken at a resting state? Yes  Focused Assessment No change from prior assessment  Does the patient meet 2 or more of the SIRS criteria? No  Does the patient have a confirmed or suspected source of infection? Yes  Provider and Rapid Response Notified? Yes (Rapid Response not notified)  Early Detection of Sepsis Score *See Row Information* Medium  MEWS guidelines implemented *See Row Information* Yes  Treat  MEWS Interventions Escalated (See documentation below)  Pain Scale 0-10  Pain Score 6  Pain Location Generalized  Interventions Relaxation;Reposition  Take Vital Signs  Increase Vital Sign Frequency  Yellow: Q 2hr X 2 then Q 4hr X 2, if remains yellow, continue Q 4hrs  Escalate  MEWS: Escalate Yellow: discuss with charge nurse/RN and consider discussing with provider and RRT  Notify: Charge Nurse/RN  Name of Charge Nurse/RN Notified [place], RN  Date Charge Nurse/RN Notified 11/07/20  Time Charge Nurse/RN Notified 2345  Notify: Provider  Provider Name/Title Sharion Settler, NP  Date Provider Notified 11/08/20  Time Provider Notified [phone]  Notification Type  (epic secure chat)  Notification Reason Other (Comment) (Elevated heart rate)  Provider response No new orders  Date of Provider Response 11/08/20  Time of Provider Response 0039  Notify: Rapid Response  Name of Rapid Response RN Notified RRT not called  Document  Patient Outcome Other (Comment) (Patient stable. No acute distress noted)  Progress note created (see row info) Yes   Assess: SIRS CRITERIA  SIRS Temperature  0  SIRS Pulse 1  SIRS Respirations  0  SIRS WBC 0  SIRS Score Sum  1

## 2020-11-09 ENCOUNTER — Inpatient Hospital Stay: Payer: Medicare HMO

## 2020-11-09 DIAGNOSIS — D638 Anemia in other chronic diseases classified elsewhere: Secondary | ICD-10-CM | POA: Diagnosis not present

## 2020-11-09 DIAGNOSIS — E11621 Type 2 diabetes mellitus with foot ulcer: Secondary | ICD-10-CM | POA: Diagnosis not present

## 2020-11-09 DIAGNOSIS — J9611 Chronic respiratory failure with hypoxia: Secondary | ICD-10-CM | POA: Diagnosis not present

## 2020-11-09 DIAGNOSIS — N186 End stage renal disease: Secondary | ICD-10-CM | POA: Diagnosis not present

## 2020-11-09 LAB — RENAL FUNCTION PANEL
Albumin: 2.7 g/dL — ABNORMAL LOW (ref 3.5–5.0)
Anion gap: 12 (ref 5–15)
BUN: 59 mg/dL — ABNORMAL HIGH (ref 6–20)
CO2: 23 mmol/L (ref 22–32)
Calcium: 6.9 mg/dL — ABNORMAL LOW (ref 8.9–10.3)
Chloride: 86 mmol/L — ABNORMAL LOW (ref 98–111)
Creatinine, Ser: 8 mg/dL — ABNORMAL HIGH (ref 0.44–1.00)
GFR, Estimated: 6 mL/min — ABNORMAL LOW (ref >60)
Glucose, Bld: 217 mg/dL — ABNORMAL HIGH (ref 70–99)
Phosphorus: 5 mg/dL — ABNORMAL HIGH (ref 2.5–4.6)
Potassium: 4.3 mmol/L (ref 3.5–5.1)
Sodium: 121 mmol/L — ABNORMAL LOW (ref 135–145)

## 2020-11-09 LAB — GLUCOSE, CAPILLARY
Glucose-Capillary: 179 mg/dL — ABNORMAL HIGH (ref 70–99)
Glucose-Capillary: 205 mg/dL — ABNORMAL HIGH (ref 70–99)
Glucose-Capillary: 175 mg/dL — ABNORMAL HIGH (ref 70–99)

## 2020-11-09 IMAGING — DX DG CHEST 1V PORT
1 series · 1 of 1 positions shown · non-contrast
Comparison: [DATE]

CLINICAL DATA: Short of breath

EXAM:
PORTABLE CHEST 1 VIEW

[chest ap]
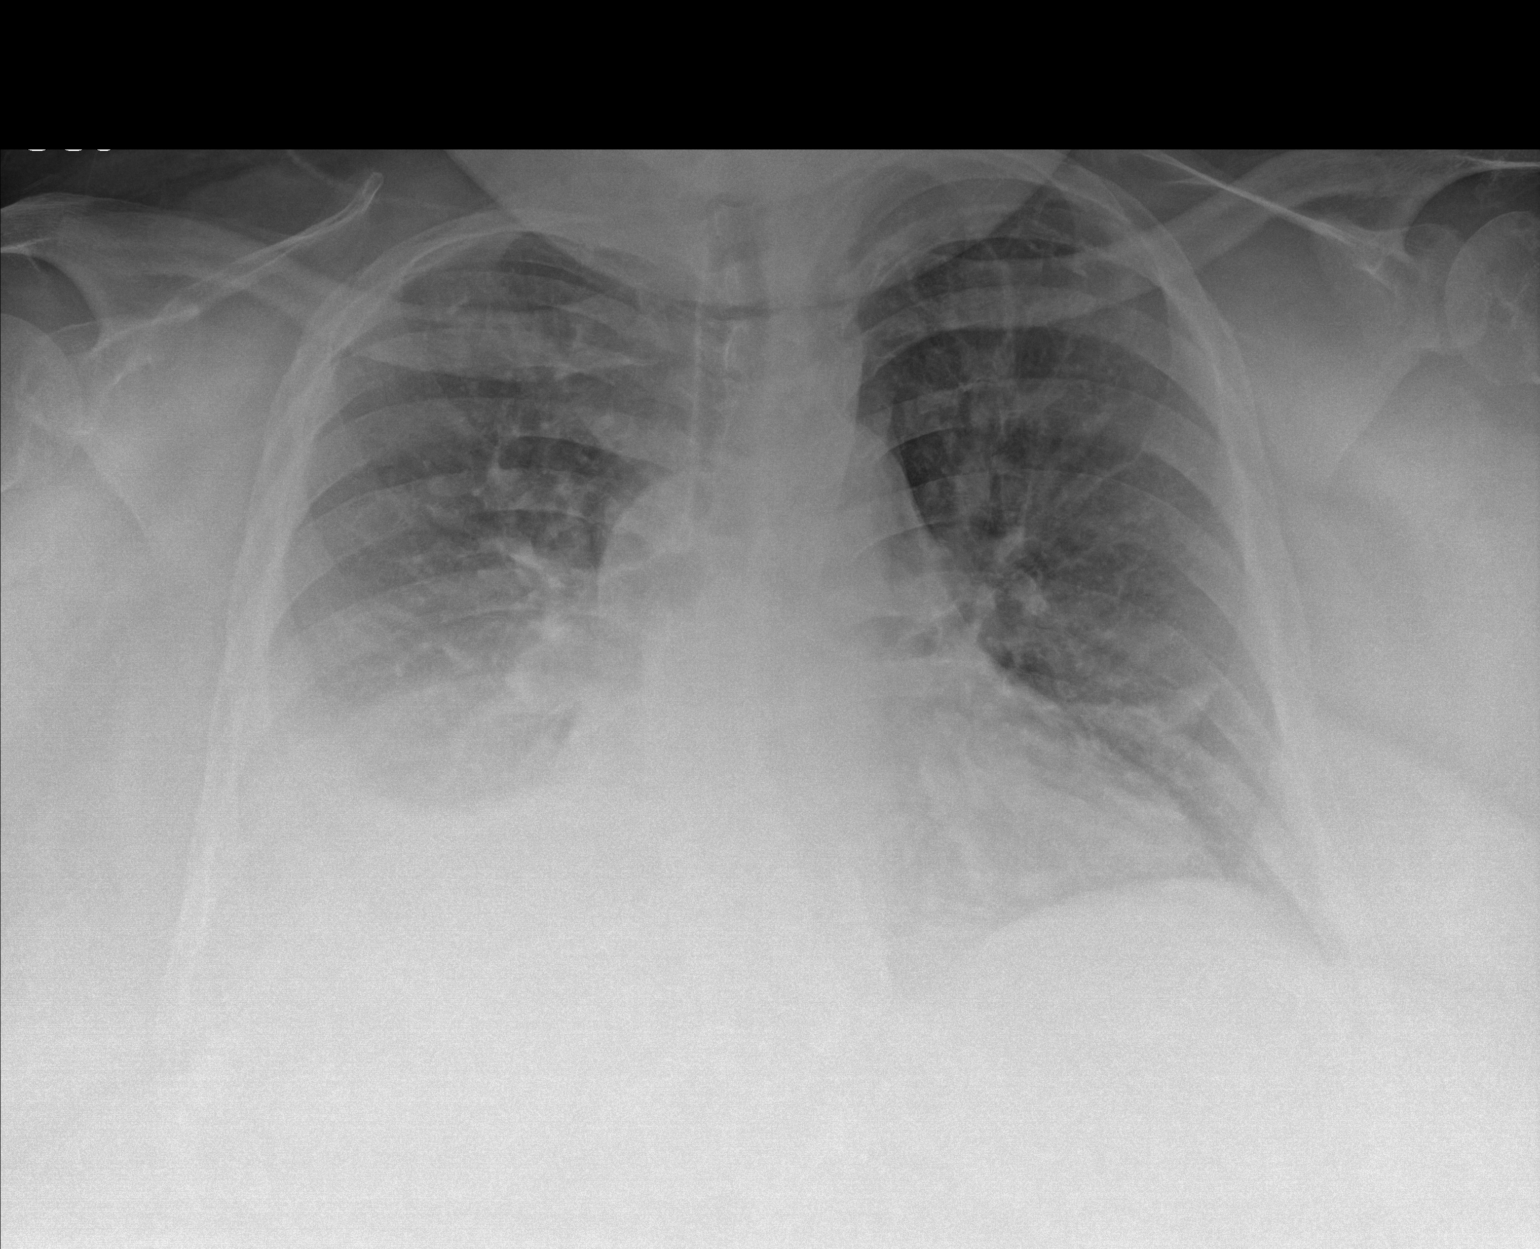

[1 of 1 positions shown; findings below may reference images not displayed]

FINDINGS: Single frontal view of the chest demonstrates a stable cardiac
silhouette. There is chronic central vascular congestion, with
persistent veiling opacity at the right lung base consistent with
effusion and consolidation. No pneumothorax.
IMPRESSION: 1. Right basilar consolidation and effusion.
2. Central vascular congestion.

## 2020-11-09 MED ORDER — SODIUM CHLORIDE 0.9 % IV SOLN
1.0000 g | INTRAVENOUS | Status: DC
  Administered 2020-11-09: 1 g via INTRAVENOUS
  Filled 2020-11-09: qty 1

## 2020-11-09 MED ORDER — METOPROLOL TARTRATE 25 MG PO TABS
25.0000 mg | ORAL_TABLET | Freq: Two times a day (BID) | ORAL | Status: DC
  Administered 2020-11-09 – 2020-11-10 (×2): 25 mg via ORAL
  Filled 2020-11-09 (×2): qty 1

## 2020-11-09 NOTE — Progress Notes (Signed)
[facility]  ROUNDING NOTE   Subjective:   Seen and examined on hemodialysis treatment.     HEMODIALYSIS FLOWSHEET:  Blood Flow Rate (mL/min): 200 mL/min Arterial Pressure (mmHg): -80 mmHg Venous Pressure (mmHg): 70 mmHg Transmembrane Pressure (mmHg): 30 mmHg Ultrafiltration Rate (mL/min): 70 mL/min Dialysate Flow Rate (mL/min): 500 ml/min Conductivity: Machine : 13.5 Conductivity: Machine : 13.5 Dialysis Fluid Bolus: Normal Saline Bolus Amount (mL): 250 mL   Objective:  Vital signs in last 24 hours:  Temp:  [97.6 F (36.4 C)-98.9 F (37.2 C)] 98.2 F (36.8 C) (09/15 0822) Pulse Rate:  [93-109] 108 (09/15 0822) Resp:  [17-20] 18 (09/15 0822) BP: (120-138)/(74-90) 135/90 (09/15 0822) SpO2:  [95 %-100 %] 99 % (09/15 0822)  Weight change:  Filed Weights   10/15/20 1905 10/18/20 0928 11/06/20 0646  Weight: 126.1 kg 126.1 kg (!) 146.7 kg    Intake/Output: I/O last 3 completed shifts: In: 496 [P.O.:480; I.V.:16] Out: -    Intake/Output this shift:  No intake/output data recorded.  Physical Exam: General: NAD, resting in bed  Head: Normocephalic, atraumatic. Moist oral mucosal membranes  Eyes: Anicteric  Lungs:  Clear to auscultation, O2 2L  Heart: Regular rate and rhythm  Abdomen:  Soft, nontender  Extremities: trace peripheral edema. Right lower extremity in dressings.   Neurologic: Alert, moving all four extremities  Skin: No lesions  Access: Left AVF    Basic Metabolic Panel: Recent Labs  Lab 11/03/20 0838 11/06/20 0408 11/08/20 1445 11/09/20 0412  NA 125* 123* 120* 121*  K 3.6 4.0 4.2 4.3  CL 87* 88* 84* 86*  CO2 22 23 23 23   GLUCOSE 198* 208* 193* 217*  BUN 46* 52* 52* 59*  CREATININE 7.30* 8.04* 7.26* 8.00*  CALCIUM 7.1* 6.8* 6.9* 6.9*  PHOS 4.3 3.9  --  5.0*     Liver Function Tests: Recent Labs  Lab 11/03/20 0838 11/09/20 0412  ALBUMIN 2.4* 2.7*    No results for input(s): LIPASE, AMYLASE in the last 168 hours. No  results for input(s): AMMONIA in the last 168 hours.  CBC: Recent Labs  Lab 11/03/20 0838 11/06/20 0408 11/08/20 1445  WBC 13.5* 12.5* 10.9*  HGB 8.1* 7.5* 8.0*  HCT 25.4* 23.6* 24.6*  MCV 94.4 95.9 95.3  PLT 309 318 304     Cardiac Enzymes: No results for input(s): CKTOTAL, CKMB, CKMBINDEX, TROPONINI in the last 168 hours.  BNP: Invalid input(s): POCBNP  CBG: Recent Labs  Lab 11/08/20 0803 11/08/20 1156 11/08/20 1700 11/08/20 2321 11/09/20 0748  GLUCAP 148* 126* 242* 191* 179*     Microbiology: Results for orders placed or performed during the hospital encounter of 10/15/20  Blood Culture (routine x 2)     Status: None   Collection Time: 10/15/20  8:03 PM   Specimen: BLOOD  Result Value Ref Range Status   Specimen Description BLOOD BLOOD RIGHT FOREARM  Final   Special Requests   Final    BOTTLES DRAWN AEROBIC AND ANAEROBIC Blood Culture adequate volume   Culture   Final    NO GROWTH 5 DAYS Performed at [facility], [address], [place], [place] [address]    Report Status 10/20/2020 FINAL  Final  Resp Panel by RT-PCR (Flu A&B, Covid) Nasopharyngeal Swab     Status: None   Collection Time: 10/15/20  8:26 PM   Specimen: Nasopharyngeal Swab; Nasopharyngeal(NP) swabs in vial transport medium  Result Value Ref Range Status   SARS Coronavirus 2 by RT  PCR NEGATIVE NEGATIVE Final    Comment: (NOTE) SARS-CoV-2 target nucleic acids are NOT DETECTED.  The SARS-CoV-2 RNA is generally detectable in upper respiratory specimens during the acute phase of infection. The lowest concentration of SARS-CoV-2 viral copies this assay can detect is 138 copies/mL. A negative result does not preclude SARS-Cov-2 infection and should not be used as the sole basis for treatment or other patient management decisions. A negative result may occur with  improper specimen collection/handling, submission of specimen other than nasopharyngeal swab, presence of viral  mutation(s) within the areas targeted by this assay, and inadequate number of viral copies(<138 copies/mL). A negative result must be combined with clinical observations, patient history, and epidemiological information. The expected result is Negative.  Fact Sheet for Patients:  EntrepreneurPulse.com.au  Fact Sheet for Healthcare Providers:  IncredibleEmployment.be  This test is no t yet approved or cleared by the [place] FDA and  has been authorized for detection and/or diagnosis of SARS-CoV-2 by FDA under an Emergency Use Authorization (EUA). This EUA will remain  in effect (meaning this test can be used) for the duration of the COVID-19 declaration under Section 564(b)(1) of the Act, 21 U.S.C.section 360bbb-3(b)(1), unless the authorization is terminated  or revoked sooner.       Influenza A by PCR NEGATIVE NEGATIVE Final   Influenza B by PCR NEGATIVE NEGATIVE Final    Comment: (NOTE) The Xpert Xpress SARS-CoV-2/FLU/RSV plus assay is intended as an aid in the diagnosis of influenza from Nasopharyngeal swab specimens and should not be used as a sole basis for treatment. Nasal washings and aspirates are unacceptable for Xpert Xpress SARS-CoV-2/FLU/RSV testing.  Fact Sheet for Patients: EntrepreneurPulse.com.au  Fact Sheet for Healthcare Providers: IncredibleEmployment.be  This test is not yet approved or cleared by the [place] FDA and has been authorized for detection and/or diagnosis of SARS-CoV-2 by FDA under an Emergency Use Authorization (EUA). This EUA will remain in effect (meaning this test can be used) for the duration of the COVID-19 declaration under Section 564(b)(1) of the Act, 21 U.S.C. section 360bbb-3(b)(1), unless the authorization is terminated or revoked.  Performed at [facility], [address] [address] [place], [place] [address]   Blood Culture (routine x 2)      Status: None   Collection Time: 10/15/20  8:26 PM   Specimen: BLOOD  Result Value Ref Range Status   Specimen Description BLOOD RIGHT ANTECUBITAL  Final   Special Requests   Final    BOTTLES DRAWN AEROBIC AND ANAEROBIC Blood Culture adequate volume   Culture   Final    NO GROWTH 5 DAYS Performed at [facility], [address] [place], [place] [address]    Report Status 10/20/2020 FINAL  Final  Aerobic Culture w Gram Stain (superficial specimen)     Status: None   Collection Time: 10/17/20  8:52 PM   Specimen: Wound  Result Value Ref Range Status   Specimen Description WOUND  Final   Special Requests NONE  Final   Gram Stain   Final    RARE SQUAMOUS EPITHELIAL CELLS PRESENT FEW WBC PRESENT, PREDOMINANTLY MONONUCLEAR ABUNDANT GRAM VARIABLE ROD FEW GRAM POSITIVE COCCI    Culture   Final    MODERATE ENTEROCOCCUS FAECALIS MODERATE KLEBSIELLA OXYTOCA ABUNDANT DIPHTHEROIDS(CORYNEBACTERIUM SPECIES) Standardized susceptibility testing for this organism is not available.    Report Status 10/20/2020 FINAL  Final   Organism ID, Bacteria ENTEROCOCCUS FAECALIS  Final   Organism ID, Bacteria KLEBSIELLA OXYTOCA  Final  Susceptibility   Enterococcus faecalis - MIC*    AMPICILLIN <=2 SENSITIVE Sensitive     VANCOMYCIN 1 SENSITIVE Sensitive     GENTAMICIN SYNERGY RESISTANT Resistant     * MODERATE ENTEROCOCCUS FAECALIS   Klebsiella oxytoca - MIC*    AMPICILLIN >=32 RESISTANT Resistant     CEFAZOLIN <=4 SENSITIVE Sensitive     CEFEPIME <=0.12 SENSITIVE Sensitive     CEFTAZIDIME <=1 SENSITIVE Sensitive     CEFTRIAXONE <=0.25 SENSITIVE Sensitive     CIPROFLOXACIN <=0.25 SENSITIVE Sensitive     GENTAMICIN <=1 SENSITIVE Sensitive     IMIPENEM <=0.25 SENSITIVE Sensitive     TRIMETH/SULFA <=20 SENSITIVE Sensitive     AMPICILLIN/SULBACTAM 8 SENSITIVE Sensitive     PIP/TAZO <=4 SENSITIVE Sensitive     * MODERATE KLEBSIELLA OXYTOCA  Aerobic/Anaerobic Culture w Gram Stain  (surgical/deep wound)     Status: None   Collection Time: 10/21/20  3:07 PM   Specimen: PATH Digit amputation; Tissue  Result Value Ref Range Status   Specimen Description   Final    FOOT  RIGHT 5TH DIGIT AMPUTATION Performed at [facility], [address] [place], [place] [address]    Special Requests   Final    NONE Performed at [facility], [place], [place] [address]    Gram Stain   Final    FEW SQUAMOUS EPITHELIAL CELLS PRESENT FEW WBC SEEN FEW GRAM POSITIVE COCCI    Culture   Final    FEW STREPTOCOCCUS ANGINOSIS NO ANAEROBES ISOLATED Performed at [facility], [place] [address] [place], [place] [address]    Report Status 10/27/2020 FINAL  Final   Organism ID, Bacteria STREPTOCOCCUS ANGINOSIS  Final      Susceptibility   Streptococcus anginosis - MIC*    PENICILLIN 0.12 SENSITIVE Sensitive     CEFTRIAXONE 0.5 SENSITIVE Sensitive     ERYTHROMYCIN 4 RESISTANT Resistant     LEVOFLOXACIN 0.5 SENSITIVE Sensitive     VANCOMYCIN 0.5 SENSITIVE Sensitive     * FEW STREPTOCOCCUS ANGINOSIS  Aerobic/Anaerobic Culture w Gram Stain (surgical/deep wound)     Status: None   Collection Time: 10/26/20  5:35 PM   Specimen: PATH Other; Tissue  Result Value Ref Range Status   Specimen Description   Final    FOOT RIGHT Performed at [facility], [address], [place], [place] [address]    Special Requests   Final    NONE Performed at [facility], [address] [place],  [address]    Gram Stain NO WBC SEEN NO ORGANISMS SEEN   Final   Culture   Final    RARE STREPTOCOCCUS ANGINOSIS NO ANAEROBES ISOLATED Performed at [facility], [place] [address], [place],  [address]    Report Status 10/31/2020 FINAL  Final   Organism ID, Bacteria STREPTOCOCCUS ANGINOSIS  Final      Susceptibility   Streptococcus anginosis - MIC*    PENICILLIN 0.12 SENSITIVE Sensitive     CEFTRIAXONE 0.5 SENSITIVE Sensitive      ERYTHROMYCIN >=8 RESISTANT Resistant     LEVOFLOXACIN 0.5 SENSITIVE Sensitive     VANCOMYCIN 0.5 SENSITIVE Sensitive     * RARE STREPTOCOCCUS ANGINOSIS    Coagulation Studies: No results for input(s): LABPROT, INR in the last 72 hours.   Urinalysis: No results for input(s): COLORURINE, LABSPEC, PHURINE, GLUCOSEU, HGBUR, BILIRUBINUR, KETONESUR, PROTEINUR, UROBILINOGEN, NITRITE, LEUKOCYTESUR in the last 72 hours.  Invalid input(s):  APPERANCEUR    Imaging: No results found.   Medications:    sodium chloride 0 mL/hr at 10/22/20 1200   cefTAZidime (FORTAZ)  IV Stopped (11/07/20 1231)   cefTAZidime (FORTAZ)  IV     cefTAZidime (FORTAZ)  IV Stopped (11/04/20 1429)   vancomycin Stopped (11/07/20 1232)    vitamin C  500 mg Oral Daily   aspirin EC  81 mg Oral Daily   benzonatate  200 mg Oral TID   calcium carbonate  1 tablet Oral TID with meals   Chlorhexidine Gluconate Cloth  6 each Topical Daily   cholecalciferol  4,000 Units Oral Weekly   ciprofloxacin-dexamethasone  4 drop Both EARS BID   docusate sodium  100 mg Oral Daily   epoetin (EPOGEN/PROCRIT) injection  10,000 Units Intravenous Q T,Th,Sa-HD   feeding supplement (NEPRO CARB STEADY)  237 mL Oral BID BM   ferric citrate  210 mg Oral TID WC   heparin  5,000 Units Subcutaneous Q8H   insulin aspart  0-5 Units Subcutaneous QHS   insulin aspart  0-9 Units Subcutaneous TID WC   insulin aspart  3 Units Subcutaneous TID WC   insulin glargine-yfgn  43 Units Subcutaneous QHS   ipratropium-albuterol  3 mL Nebulization TID   linaclotide  290 mcg Oral q morning   loratadine  10 mg Oral Daily   metroNIDAZOLE  500 mg Oral Q12H   mometasone-formoterol  2 puff Inhalation BID   multivitamin  1 tablet Oral QHS   pantoprazole  40 mg Oral Daily   sodium chloride flush  3 mL Intravenous Q12H   sodium chloride, acetaminophen, albuterol, albuterol, ALPRAZolam, alum & mag hydroxide-simeth, calcium carbonate, dextromethorphan-guaiFENesin,  diphenhydrAMINE, hydrALAZINE, HYDROmorphone (DILAUDID) injection, lactulose, metoprolol tartrate, ondansetron (ZOFRAN) IV, ondansetron, oxyCODONE, oxyCODONE, phenol, polyethylene glycol, sodium chloride flush, traZODone  Assessment/ Plan:  [name] is a 50 y.o. black female with end stage renal disease on hemodialysis, diabetes mellitus type II, hypertension, congestive heart failure, COPD, who is admitted to [facility] on 10/15/2020 for [facility]) [I96] Diabetic foot ulcer ([place]) [T70.177, L97.509] Decreased pedal pulses [R09.89] Sepsis, due to unspecified organism, unspecified whether acute organ dysfunction present [facility]) [A41.9]  [facility] TTS Left AVF 127.5kg  End stage renal disease: seen and examined on hemodialysis treatment.  - Continue TTS schedule.    Hypertension: 135/90. Currently not on any blood pressure agents.   Hyponatremia: secondary to ESRD.  Na 121.  - lengthen treatment to 240 minutes - Na linear 140.   Diabetes mellitus type II with chronic kidney disease: complicated by diabetic foot ulcer and osteomyelitis. Not well controlled. Hemoglobin A1c 8.9%.   Osteomyelitis: with step anginosis - outpatient antibiotics: cefepime 2g with HD treatments. End date 9/29  Anemia of chronic kidney disease: hemoglobin 8, normocytic.  - EPO 10000 units on HD treatments.   Secondary Hyperparathyroidism: with hypocalcemia. Corrected calcium of 7.9. Discontinued cinacalcet. Patient was on etelcalcetide as outpatient.  - Calcium carbonate  - Continue Auryxia.     LOS: 25 Jimmie Dattilio 9/15/202211:03 AM

## 2020-11-09 NOTE — Progress Notes (Signed)
Nutrition Follow-up  DOCUMENTATION CODES:  Morbid obesity  INTERVENTION:  Continue Carb modified diet to maximize intake and meal selection choices. Continue Nepro Shake po BID, each supplement provides 425 kcal and 19 grams protein Vitamin C 500mg  daily for wound healing and HD Renavite daily for HD needs  NUTRITION DIAGNOSIS:  Increased nutrient needs related to wound healing as evidenced by estimated needs.  GOAL:  Patient will meet greater than or equal to 90% of their needs  MONITOR:  PO intake, Supplement acceptance, Skin  REASON FOR ASSESSMENT:  LOS    ASSESSMENT:  50 y.o. female with past medical history including DM, ESRD on HD, CHF, diverticulosis, HTN, and COPD, presented to ED with fatigue, malaise, and worsening toe wound.  8/24 - angiogram  right lower extremity 8/27 - amputation of the fifth ray and I&D of abscess of the right foot 9/1 - washout procedure  Pt out of room for HD at the time of assessment. Pt currently stable for discharge and will be discharging home once outpatient antibiotic infusions are finalized. Stable intake since last assessment but noted that pt is refusing nepro supplements. Will follow-up with pt about intake as protein needs and nutrition were emphasized at last assessment for wound healing.  Average Meal Intake: 8/22-8/31: 33% intake x 3 recorded meals (0-50%) 9/1-9/7: 85% intake x 3 recorded meals 9/8-9/15: 77% intake x 9 recorded meals  Nutritionally Relevant Medications: Scheduled Meds:  vitamin C  500 mg Oral Daily   calcium carbonate  1 tablet Oral TID with meals   cholecalciferol  4,000 Units Oral Weekly   docusate sodium  100 mg Oral Daily   feeding supplement (NEPRO CARB STEADY)  237 mL Oral BID BM   ferric citrate  210 mg Oral TID WC   insulin aspart  0-5 Units Subcutaneous QHS   insulin aspart  0-9 Units Subcutaneous TID WC   insulin aspart  3 Units Subcutaneous TID WC   insulin glargine-yfgn  43 Units Subcutaneous  QHS   linaclotide  290 mcg Oral q morning   metroNIDAZOLE  500 mg Oral Q12H   multivitamin  1 tablet Oral QHS   pantoprazole  40 mg Oral Daily   Continuous Infusions:  cefTAZidime (FORTAZ)  IV     PRN Meds: alum & mag hydroxide-simeth, calcium carbonate, diphenhydrAMINE, lactulose, ondansetron, polyethylene glycol  Labs Reviewed: Na 121, Chloride 86 BUN 59, creatinine 8.0 SBG ranges from 1126-242 mg/dL over the last 24 hours HgbA1c 8.9% (8/21)  NUTRITION - FOCUSED PHYSICAL EXAM: Defer to in-person assessment  Diet Order:   Diet Order             Diet Carb Modified Fluid consistency: Thin; Room service appropriate? Yes; Fluid restriction: 1200 mL Fluid  Diet effective now                  EDUCATION NEEDS:  No education needs have been identified at this time  Skin:  Skin Assessment: Skin Integrity Issues: Skin Integrity Issues:: Incisions, Diabetic Ulcer Diabetic Ulcer: right heel Incisions: right foot, 5th toe  Last BM:  9/15 - type 6  Height:  Ht Readings from Last 1 Encounters:  10/18/20 5' 4.5" (1.638 m)   Weight:  Wt Readings from Last 1 Encounters:  11/06/20 (!) 146.7 kg   Ideal Body Weight:  56.8 kg  BMI:  Body mass index is 54.66 kg/m.  Estimated Nutritional Needs:  Kcal:  2000-2200 kcal/d Protein:  100-120 g/d Fluid:  1L+UOP  Ranell Patrick, RD, LDN Clinical Dietitian Pager on [place]

## 2020-11-09 NOTE — Progress Notes (Signed)
PROGRESS NOTE    [name]  WSF:[id number] DOB: Aug 12, 1970 DOA: 10/15/2020 PCP: [name], NP    Brief Narrative:  Patient admitted with diabetic foot ulcers with gangrene of the fifth toe and osteomyelitis.  Past medical history chronic diastolic congestive heart failure, chronic hypoxic respiratory failure on 3 to 4 L of oxygen, end-stage renal disease with diabetes, morbid obesity.  On 10/21/2020 had amputation of the fifth ray and I&D of abscess on the right foot.  On 10/26/2020 had washout procedure.  The patient initially was on IV Zosyn.  Switched over to ceftazidime with dosing with dialysis at this point.  Specialist had recommended amputation but patient is not ready to make that decision at this point in time.  Patient has been hospitalized for extended period of time.  Multiple consulting services at this time of recommended lower extremity amputation however the patient has refused stating she wants to attempt a protracted course of antibiotics and seek out second opinion at tertiary care center.  The patient is remained hemodynamically stable.  Will require wound care.  Therapy recommendations were for skilled nursing facility however the patient declined all available bed offers.  Plan to discharge home with home health services.  Home health nursing for wound care and physical therapy have been ordered.  Plan date of discharge 9/14.  Antibiotics to be administered with hemodialysis.  Antibiotic regimen include vancomycin and ceftazidime.  Last date of antibiotics will be 9/28.   Assessment & Plan:   Principal Problem:   Diabetic foot ulcer ([place]) Active Problems:   Diabetes ([place])   Obesity, Class III, BMI 40-49.9 (morbid obesity) ([place])   Essential hypertension   Type 2 diabetes mellitus with ESRD (end-stage renal disease) ([place])   DNR (do not resuscitate) discussion   End-stage renal disease on hemodialysis ([place])   Chronic respiratory failure with hypoxia (HCC)    Decreased pedal pulses   Gangrene (HCC)   PVD (peripheral vascular disease) ([place])   Aspiration into airway   Hearing loss of right ear   Hyponatremia   Anemia of chronic disease  Diabetic fold associate with gangrene of the fifth toe associated with osteomyelitis Status post amputation of fifth ray and I&D on 8/27 Washout procedure on 10/26/2020 ID and podiatry following Reaffirmed with patient, she is declining amputation at this time Encouraged her not to delay her decision and seek out surgical evaluation when she is ready to make the decision Plan: IV vancomycin and ceftazidime with dialysis Will need 4 weeks of therapy Antibiotic end date 9/28 Appreciate ID follow-up Outpatient antibiotic regimen in place.   Patient is medically stable for discharge at this time Recommendation was for skilled nursing facility Patient declined available bed offers Discharge home with home health services -Discussed with nephrology.  IV ceftazidime is on national back order and may be difficult to obtain at outpatient HD center.  As such, she has been switched to cefepime with dialysis.  This has been arranged by nephrology.  ESRD on HD Nephrology consulted TTS dialysis schedule Follow nephrology recommendation for inpatient HD needs Hemodialysis today 9/15 Since she does complain of shortness of breath, will repeat chest x-ray today  Diabetes mellitus type 2, uncontrolled Semglee 43 units nightly NovoLog 3 units 3 times daily with meals NovoLog and sliding scale Carb modified diet  Anemia of chronic kidney disease Stable, will monitor  Peripheral vascular disease Continue aspirin  Hyponatremia Managed with hemodialysis -still appears to be volume overloaded -further restriction of fluid  intake -sodium down to 120 -Recheck in a.m.  Chronic hypoxic respiratory failure At baseline, 3 to 4 L  Morbid obesity This complicates overall care and prognosis  Weakness Functional  decline Recommendations were for skilled nursing facility -Patient electing to discharge home with home health services   DVT prophylaxis: SQ Lovenox Code Status: Full Family Communication: None today Disposition Plan: Status is: Inpatient  Remains inpatient appropriate because:Unsafe d/c plan  Dispo: The patient is from: Home              Anticipated d/c is to: SNF              Patient currently is medically stable to d/c.   Difficult to place patient No  Patient is refusing amputation as recommended by our surgical teams.  As such she is medically stable for discharge at this time.  She will be on 4 weeks of antibiotics.  Recommendation was for skilled nursing facility with patient declined and wishes to go home.  Home health services been ordered.   Level of care: Med-Surg  Consultants:  ID Podiatry Nephrology  Procedures:  8/24 angiogram right lower extremity 8/27 amputation of the fifth ray and I&D of abscess of the right foot 9/1 washout procedure  Antimicrobials:  Vancomycin Ceftazidime   Subjective: Reports that she had to cut her dialysis session short today by 20 minutes since she had to use the bathroom.  She does report feeling short of breath and has productive cough.  Objective: Vitals:   11/09/20 1345 11/09/20 1400 11/09/20 1447 11/09/20 1521  BP: 111/89 133/82  (!) 118/51  Pulse: (!) 113 (!) 115  (!) 105  Resp: 20 19  18   Temp:    98.5 F (36.9 C)  TempSrc:    Oral  SpO2:   100% 100%  Weight:      Height:        Intake/Output Summary (Last 24 hours) at 11/09/2020 1946 Last data filed at 11/09/2020 1400 Gross per 24 hour  Intake 3 ml  Output 2429 ml  Net -2426 ml   Filed Weights   10/15/20 1905 10/18/20 0928 11/06/20 0646  Weight: 126.1 kg 126.1 kg (!) 146.7 kg    Examination:  General exam: Appears calm and comfortable  Respiratory system: Crackles at bases. Respiratory effort normal. Cardiovascular system: S1-S2, RRR, no murmurs, no  pedal edema Gastrointestinal system: Obese, nontender, nondistended, normal bowel sounds Central nervous system: Alert and oriented. No focal neurological deficits. Extremities: Decreased power bilateral lower extremities Skin: Lower leg with skin darkening, significant nonpitting edema.  Right foot with significant wounds Psychiatry: Judgement and insight appear normal. Mood & affect appropriate.     Data Reviewed: I have personally reviewed following labs and imaging studies  CBC: Recent Labs  Lab 11/03/20 0838 11/06/20 0408 11/08/20 1445  WBC 13.5* 12.5* 10.9*  HGB 8.1* 7.5* 8.0*  HCT 25.4* 23.6* 24.6*  MCV 94.4 95.9 95.3  PLT 309 318 235   Basic Metabolic Panel: Recent Labs  Lab 11/03/20 0838 11/06/20 0408 11/08/20 1445 11/09/20 0412  NA 125* 123* 120* 121*  K 3.6 4.0 4.2 4.3  CL 87* 88* 84* 86*  CO2 22 23 23 23   GLUCOSE 198* 208* 193* 217*  BUN 46* 52* 52* 59*  CREATININE 7.30* 8.04* 7.26* 8.00*  CALCIUM 7.1* 6.8* 6.9* 6.9*  PHOS 4.3 3.9  --  5.0*   GFR: Estimated Creatinine Clearance: 12.4 mL/min (A) (by C-G formula based on SCr of 8 mg/dL (  H)). Liver Function Tests: Recent Labs  Lab 11/03/20 0838 11/09/20 0412  ALBUMIN 2.4* 2.7*   No results for input(s): LIPASE, AMYLASE in the last 168 hours. No results for input(s): AMMONIA in the last 168 hours. Coagulation Profile: No results for input(s): INR, PROTIME in the last 168 hours.  Cardiac Enzymes: No results for input(s): CKTOTAL, CKMB, CKMBINDEX, TROPONINI in the last 168 hours. BNP (last 3 results) No results for input(s): PROBNP in the last 8760 hours. HbA1C: No results for input(s): HGBA1C in the last 72 hours. CBG: Recent Labs  Lab 11/08/20 1156 11/08/20 1700 11/08/20 2321 11/09/20 0748 11/09/20 1519  GLUCAP 126* 242* 191* 179* 205*   Lipid Profile: No results for input(s): CHOL, HDL, LDLCALC, TRIG, CHOLHDL, LDLDIRECT in the last 72 hours. Thyroid Function Tests: No results for  input(s): TSH, T4TOTAL, FREET4, T3FREE, THYROIDAB in the last 72 hours. Anemia Panel: No results for input(s): VITAMINB12, FOLATE, FERRITIN, TIBC, IRON, RETICCTPCT in the last 72 hours. Sepsis Labs: No results for input(s): PROCALCITON, LATICACIDVEN in the last 168 hours.  No results found for this or any previous visit (from the past 240 hour(s)).        Radiology Studies: No results found.      Scheduled Meds:  vitamin C  500 mg Oral Daily   aspirin EC  81 mg Oral Daily   benzonatate  200 mg Oral TID   calcium carbonate  1 tablet Oral TID with meals   Chlorhexidine Gluconate Cloth  6 each Topical Daily   cholecalciferol  4,000 Units Oral Weekly   ciprofloxacin-dexamethasone  4 drop Both EARS BID   docusate sodium  100 mg Oral Daily   epoetin (EPOGEN/PROCRIT) injection  10,000 Units Intravenous Q T,Th,Sa-HD   feeding supplement (NEPRO CARB STEADY)  237 mL Oral BID BM   ferric citrate  210 mg Oral TID WC   heparin  5,000 Units Subcutaneous Q8H   insulin aspart  0-5 Units Subcutaneous QHS   insulin aspart  0-9 Units Subcutaneous TID WC   insulin aspart  3 Units Subcutaneous TID WC   insulin glargine-yfgn  43 Units Subcutaneous QHS   ipratropium-albuterol  3 mL Nebulization TID   linaclotide  290 mcg Oral q morning   loratadine  10 mg Oral Daily   metoprolol tartrate  25 mg Oral BID   metroNIDAZOLE  500 mg Oral Q12H   mometasone-formoterol  2 puff Inhalation BID   multivitamin  1 tablet Oral QHS   pantoprazole  40 mg Oral Daily   sodium chloride flush  3 mL Intravenous Q12H   Continuous Infusions:  sodium chloride 0 mL/hr at 10/22/20 1200   cefTAZidime (FORTAZ)  IV Stopped (11/07/20 1231)   cefTAZidime (FORTAZ)  IV 1 g (11/09/20 1642)   cefTAZidime (FORTAZ)  IV Stopped (11/04/20 1429)   vancomycin Stopped (11/09/20 1542)     LOS: 25 days    Time spent: 25 minutes    Kathie Dike, MD [facility]   If 7PM-7AM, please contact  night-coverage  11/09/2020, 7:46 PM

## 2020-11-10 DIAGNOSIS — E11621 Type 2 diabetes mellitus with foot ulcer: Secondary | ICD-10-CM | POA: Diagnosis not present

## 2020-11-10 DIAGNOSIS — J9611 Chronic respiratory failure with hypoxia: Secondary | ICD-10-CM | POA: Diagnosis not present

## 2020-11-10 DIAGNOSIS — D638 Anemia in other chronic diseases classified elsewhere: Secondary | ICD-10-CM | POA: Diagnosis not present

## 2020-11-10 DIAGNOSIS — E1122 Type 2 diabetes mellitus with diabetic chronic kidney disease: Secondary | ICD-10-CM | POA: Diagnosis not present

## 2020-11-10 LAB — BASIC METABOLIC PANEL
Anion gap: 8 (ref 5–15)
BUN: 43 mg/dL — ABNORMAL HIGH (ref 6–20)
CO2: 28 mmol/L (ref 22–32)
Calcium: 7.3 mg/dL — ABNORMAL LOW (ref 8.9–10.3)
Chloride: 91 mmol/L — ABNORMAL LOW (ref 98–111)
Creatinine, Ser: 6.11 mg/dL — ABNORMAL HIGH (ref 0.44–1.00)
GFR, Estimated: 8 mL/min — ABNORMAL LOW (ref >60)
Glucose, Bld: 78 mg/dL (ref 70–99)
Potassium: 4.2 mmol/L (ref 3.5–5.1)
Sodium: 127 mmol/L — ABNORMAL LOW (ref 135–145)

## 2020-11-10 LAB — GLUCOSE, CAPILLARY
Glucose-Capillary: 133 mg/dL — ABNORMAL HIGH (ref 70–99)
Glucose-Capillary: 91 mg/dL (ref 70–99)
Glucose-Capillary: 207 mg/dL — ABNORMAL HIGH (ref 70–99)

## 2020-11-10 MED ORDER — METOPROLOL TARTRATE 25 MG PO TABS: 25.0000 mg | ORAL_TABLET | Freq: Two times a day (BID) | ORAL | 0 refills | Status: DC

## 2020-11-10 MED ORDER — ALBUTEROL SULFATE (2.5 MG/3ML) 0.083% IN NEBU: 2.5000 mg | INHALATION_SOLUTION | Freq: Four times a day (QID) | RESPIRATORY_TRACT | 12 refills | Status: DC | PRN

## 2020-11-10 MED ORDER — GUAIFENESIN 100 MG/5ML PO SOLN
5.0000 mL | ORAL | Status: DC | PRN
  Administered 2020-11-10 (×2): 100 mg via ORAL
  Filled 2020-11-10 (×3): qty 5

## 2020-11-10 NOTE — Progress Notes (Signed)
[facility]  ROUNDING NOTE   Subjective:   Hemodialysis treatment yesterday. Tolerated treatment well. UF of 2479mL.  Na 127.   Objective:  Vital signs in last 24 hours:  Temp:  [98.1 F (36.7 C)-99 F (37.2 C)] 98.2 F (36.8 C) (09/16 0837) Pulse Rate:  [76-117] 93 (09/16 0837) Resp:  [17-23] 17 (09/16 0837) BP: (51-155)/(40-89) 130/74 (09/16 0837) SpO2:  [98 %-100 %] 100 % (09/16 0837)  Weight change:  Filed Weights   10/15/20 1905 10/18/20 0928 11/06/20 0646  Weight: 126.1 kg 126.1 kg (!) 146.7 kg    Intake/Output: I/O last 3 completed shifts: In: 645.3 [I.V.:3; IV Piggyback:642.3] Out: 2429 [Other:2429]   Intake/Output this shift:  Total I/O In: 120 [P.O.:120] Out: -   Physical Exam: General: NAD, sitting up in bed  Head: Normocephalic, atraumatic. Moist oral mucosal membranes  Eyes: Anicteric  Lungs:  Clear to auscultation, O2 2L  Heart: Regular rate and rhythm  Abdomen:  Soft, nontender  Extremities: trace peripheral edema. Right lower extremity in dressings.   Neurologic: Alert, moving all four extremities  Skin: No lesions  Access: Left AVF    Basic Metabolic Panel: Recent Labs  Lab 11/06/20 0408 11/08/20 1445 11/09/20 0412 11/10/20 0538  NA 123* 120* 121* 127*  K 4.0 4.2 4.3 4.2  CL 88* 84* 86* 91*  CO2 23 23 23 28   GLUCOSE 208* 193* 217* 78  BUN 52* 52* 59* 43*  CREATININE 8.04* 7.26* 8.00* 6.11*  CALCIUM 6.8* 6.9* 6.9* 7.3*  PHOS 3.9  --  5.0*  --      Liver Function Tests: Recent Labs  Lab 11/09/20 0412  ALBUMIN 2.7*    No results for input(s): LIPASE, AMYLASE in the last 168 hours. No results for input(s): AMMONIA in the last 168 hours.  CBC: Recent Labs  Lab 11/06/20 0408 11/08/20 1445  WBC 12.5* 10.9*  HGB 7.5* 8.0*  HCT 23.6* 24.6*  MCV 95.9 95.3  PLT 318 304     Cardiac Enzymes: No results for input(s): CKTOTAL, CKMB, CKMBINDEX, TROPONINI in the last 168 hours.  BNP: Invalid input(s):  POCBNP  CBG: Recent Labs  Lab 11/08/20 2321 11/09/20 0748 11/09/20 1519 11/09/20 2153 11/10/20 0839  GLUCAP 191* 179* 205* 175* 133*     Microbiology: Results for orders placed or performed during the hospital encounter of 10/15/20  Blood Culture (routine x 2)     Status: None   Collection Time: 10/15/20  8:03 PM   Specimen: BLOOD  Result Value Ref Range Status   Specimen Description BLOOD BLOOD RIGHT FOREARM  Final   Special Requests   Final    BOTTLES DRAWN AEROBIC AND ANAEROBIC Blood Culture adequate volume   Culture   Final    NO GROWTH 5 DAYS Performed at [facility], [address] [place], [place] [address]    Report Status 10/20/2020 FINAL  Final  Resp Panel by RT-PCR (Flu A&B, Covid) Nasopharyngeal Swab     Status: None   Collection Time: 10/15/20  8:26 PM   Specimen: Nasopharyngeal Swab; Nasopharyngeal(NP) swabs in vial transport medium  Result Value Ref Range Status   SARS Coronavirus 2 by RT PCR NEGATIVE NEGATIVE Final    Comment: (NOTE) SARS-CoV-2 target nucleic acids are NOT DETECTED.  The SARS-CoV-2 RNA is generally detectable in upper respiratory specimens during the acute phase of infection. The lowest concentration of SARS-CoV-2 viral copies this assay can detect is 138 copies/mL. A negative result does not preclude  SARS-Cov-2 infection and should not be used as the sole basis for treatment or other patient management decisions. A negative result may occur with  improper specimen collection/handling, submission of specimen other than nasopharyngeal swab, presence of viral mutation(s) within the areas targeted by this assay, and inadequate number of viral copies(<138 copies/mL). A negative result must be combined with clinical observations, patient history, and epidemiological information. The expected result is Negative.  Fact Sheet for Patients:  EntrepreneurPulse.com.au  Fact Sheet for Healthcare Providers:   IncredibleEmployment.be  This test is no t yet approved or cleared by the [place] FDA and  has been authorized for detection and/or diagnosis of SARS-CoV-2 by FDA under an Emergency Use Authorization (EUA). This EUA will remain  in effect (meaning this test can be used) for the duration of the COVID-19 declaration under Section 564(b)(1) of the Act, 21 U.S.C.section 360bbb-3(b)(1), unless the authorization is terminated  or revoked sooner.       Influenza A by PCR NEGATIVE NEGATIVE Final   Influenza B by PCR NEGATIVE NEGATIVE Final    Comment: (NOTE) The Xpert Xpress SARS-CoV-2/FLU/RSV plus assay is intended as an aid in the diagnosis of influenza from Nasopharyngeal swab specimens and should not be used as a sole basis for treatment. Nasal washings and aspirates are unacceptable for Xpert Xpress SARS-CoV-2/FLU/RSV testing.  Fact Sheet for Patients: EntrepreneurPulse.com.au  Fact Sheet for Healthcare Providers: IncredibleEmployment.be  This test is not yet approved or cleared by the [place] FDA and has been authorized for detection and/or diagnosis of SARS-CoV-2 by FDA under an Emergency Use Authorization (EUA). This EUA will remain in effect (meaning this test can be used) for the duration of the COVID-19 declaration under Section 564(b)(1) of the Act, 21 U.S.C. section 360bbb-3(b)(1), unless the authorization is terminated or revoked.  Performed at [facility], [address] [place], [place] [address]   Blood Culture (routine x 2)     Status: None   Collection Time: 10/15/20  8:26 PM   Specimen: BLOOD  Result Value Ref Range Status   Specimen Description BLOOD RIGHT ANTECUBITAL  Final   Special Requests   Final    BOTTLES DRAWN AEROBIC AND ANAEROBIC Blood Culture adequate volume   Culture   Final    NO GROWTH 5 DAYS Performed at [facility], [address] [place], [place]  [address]    Report Status 10/20/2020 FINAL  Final  Aerobic Culture w Gram Stain (superficial specimen)     Status: None   Collection Time: 10/17/20  8:52 PM   Specimen: Wound  Result Value Ref Range Status   Specimen Description WOUND  Final   Special Requests NONE  Final   Gram Stain   Final    RARE SQUAMOUS EPITHELIAL CELLS PRESENT FEW WBC PRESENT, PREDOMINANTLY MONONUCLEAR ABUNDANT GRAM VARIABLE ROD FEW GRAM POSITIVE COCCI    Culture   Final    MODERATE ENTEROCOCCUS FAECALIS MODERATE KLEBSIELLA OXYTOCA ABUNDANT DIPHTHEROIDS(CORYNEBACTERIUM SPECIES) Standardized susceptibility testing for this organism is not available.    Report Status 10/20/2020 FINAL  Final   Organism ID, Bacteria ENTEROCOCCUS FAECALIS  Final   Organism ID, Bacteria KLEBSIELLA OXYTOCA  Final      Susceptibility   Enterococcus faecalis - MIC*    AMPICILLIN <=2 SENSITIVE Sensitive     VANCOMYCIN 1 SENSITIVE Sensitive     GENTAMICIN SYNERGY RESISTANT Resistant     * MODERATE ENTEROCOCCUS FAECALIS   Klebsiella oxytoca - MIC*    AMPICILLIN >=32  RESISTANT Resistant     CEFAZOLIN <=4 SENSITIVE Sensitive     CEFEPIME <=0.12 SENSITIVE Sensitive     CEFTAZIDIME <=1 SENSITIVE Sensitive     CEFTRIAXONE <=0.25 SENSITIVE Sensitive     CIPROFLOXACIN <=0.25 SENSITIVE Sensitive     GENTAMICIN <=1 SENSITIVE Sensitive     IMIPENEM <=0.25 SENSITIVE Sensitive     TRIMETH/SULFA <=20 SENSITIVE Sensitive     AMPICILLIN/SULBACTAM 8 SENSITIVE Sensitive     PIP/TAZO <=4 SENSITIVE Sensitive     * MODERATE KLEBSIELLA OXYTOCA  Aerobic/Anaerobic Culture w Gram Stain (surgical/deep wound)     Status: None   Collection Time: 10/21/20  3:07 PM   Specimen: PATH Digit amputation; Tissue  Result Value Ref Range Status   Specimen Description   Final    FOOT  RIGHT 5TH DIGIT AMPUTATION Performed at [facility], [address], [place], [place] [address]    Special Requests   Final    NONE Performed at [facility], [place], [place] [address]    Gram Stain   Final    FEW SQUAMOUS EPITHELIAL CELLS PRESENT FEW WBC SEEN FEW GRAM POSITIVE COCCI    Culture   Final    FEW STREPTOCOCCUS ANGINOSIS NO ANAEROBES ISOLATED Performed at [facility], [place] [address] [place], [place] [address]    Report Status 10/27/2020 FINAL  Final   Organism ID, Bacteria STREPTOCOCCUS ANGINOSIS  Final      Susceptibility   Streptococcus anginosis - MIC*    PENICILLIN 0.12 SENSITIVE Sensitive     CEFTRIAXONE 0.5 SENSITIVE Sensitive     ERYTHROMYCIN 4 RESISTANT Resistant     LEVOFLOXACIN 0.5 SENSITIVE Sensitive     VANCOMYCIN 0.5 SENSITIVE Sensitive     * FEW STREPTOCOCCUS ANGINOSIS  Aerobic/Anaerobic Culture w Gram Stain (surgical/deep wound)     Status: None   Collection Time: 10/26/20  5:35 PM   Specimen: PATH Other; Tissue  Result Value Ref Range Status   Specimen Description   Final    FOOT RIGHT Performed at [facility], [address] [place], [place] [address]    Special Requests   Final    NONE Performed at [facility], [address] [place], [place] [address]    Gram Stain NO WBC SEEN NO ORGANISMS SEEN   Final   Culture   Final    RARE STREPTOCOCCUS ANGINOSIS NO ANAEROBES ISOLATED Performed at [facility], [place] [address] [place], [place] [address]    Report Status 10/31/2020 FINAL  Final   Organism ID, Bacteria STREPTOCOCCUS ANGINOSIS  Final      Susceptibility   Streptococcus anginosis - MIC*    PENICILLIN 0.12 SENSITIVE Sensitive     CEFTRIAXONE 0.5 SENSITIVE Sensitive     ERYTHROMYCIN >=8 RESISTANT Resistant     LEVOFLOXACIN 0.5 SENSITIVE Sensitive     VANCOMYCIN 0.5 SENSITIVE Sensitive     * RARE STREPTOCOCCUS ANGINOSIS    Coagulation Studies: No results for input(s): LABPROT, INR in the last 72 hours.   Urinalysis: No results for input(s): COLORURINE, LABSPEC, PHURINE, GLUCOSEU, HGBUR, BILIRUBINUR, KETONESUR, PROTEINUR,  UROBILINOGEN, NITRITE, LEUKOCYTESUR in the last 72 hours.  Invalid input(s): APPERANCEUR    Imaging: DG Chest Port 1 View  Result Date: 11/09/2020 CLINICAL DATA:  Short of breath EXAM: PORTABLE CHEST 1 VIEW COMPARISON:  10/22/2020 FINDINGS: Single frontal view of the chest demonstrates a stable cardiac silhouette. There is chronic central vascular congestion, with persistent veiling  opacity at the right lung base consistent with effusion and consolidation. No pneumothorax. IMPRESSION: 1. Right basilar consolidation and effusion. 2. Central vascular congestion. Electronically Signed   By: Randa Ngo M.D.   On: 11/09/2020 20:20     Medications:    sodium chloride 0 mL/hr at 10/22/20 1200   cefTAZidime (FORTAZ)  IV Stopped (11/07/20 1231)   cefTAZidime (FORTAZ)  IV 1 g (11/09/20 1642)   cefTAZidime (FORTAZ)  IV Stopped (11/04/20 1429)   vancomycin Stopped (11/09/20 1542)    vitamin C  500 mg Oral Daily   aspirin EC  81 mg Oral Daily   benzonatate  200 mg Oral TID   calcium carbonate  1 tablet Oral TID with meals   Chlorhexidine Gluconate Cloth  6 each Topical Daily   cholecalciferol  4,000 Units Oral Weekly   ciprofloxacin-dexamethasone  4 drop Both EARS BID   docusate sodium  100 mg Oral Daily   epoetin (EPOGEN/PROCRIT) injection  10,000 Units Intravenous Q T,Th,Sa-HD   feeding supplement (NEPRO CARB STEADY)  237 mL Oral BID BM   ferric citrate  210 mg Oral TID WC   heparin  5,000 Units Subcutaneous Q8H   insulin aspart  0-5 Units Subcutaneous QHS   insulin aspart  0-9 Units Subcutaneous TID WC   insulin aspart  3 Units Subcutaneous TID WC   insulin glargine-yfgn  43 Units Subcutaneous QHS   ipratropium-albuterol  3 mL Nebulization TID   linaclotide  290 mcg Oral q morning   loratadine  10 mg Oral Daily   metoprolol tartrate  25 mg Oral BID   metroNIDAZOLE  500 mg Oral Q12H   mometasone-formoterol  2 puff Inhalation BID   multivitamin  1 tablet Oral QHS   pantoprazole  40  mg Oral Daily   sodium chloride flush  3 mL Intravenous Q12H   sodium chloride, acetaminophen, albuterol, albuterol, ALPRAZolam, alum & mag hydroxide-simeth, calcium carbonate, diphenhydrAMINE, guaiFENesin, hydrALAZINE, HYDROmorphone (DILAUDID) injection, lactulose, metoprolol tartrate, ondansetron (ZOFRAN) IV, ondansetron, oxyCODONE, oxyCODONE, phenol, polyethylene glycol, sodium chloride flush, traZODone  Assessment/ Plan:  [name] is a 50 y.o. black female with end stage renal disease on hemodialysis, diabetes mellitus type II, hypertension, congestive heart failure, COPD, who is admitted to [facility] on 10/15/2020 for Gangrene [facility]) [I96] Diabetic foot ulcer ([place]) [H20.947, L97.509] Decreased pedal pulses [R09.89] Sepsis, due to unspecified organism, unspecified whether acute organ dysfunction present ([place]) [A41.9]  [facility] TTS Left AVF 127.5kg  End stage renal disease:   - Continue TTS schedule.  Dialysis for tomorrow.   Hypertension: 135/74. Currently not on any blood pressure agents.   Hyponatremia: secondary to ESRD.  Na improved to 127.  - lengthen treatment to 240 minutes while inpatient.  - Na linear 140.   Diabetes mellitus type II with chronic kidney disease: complicated by diabetic foot ulcer and osteomyelitis. Not well controlled. Hemoglobin A1c 8.9%.   Osteomyelitis: with step anginosis - outpatient antibiotics: cefepime 2g with HD treatments. End date 9/29  Anemia of chronic kidney disease: hemoglobin 8, normocytic.  - EPO 10000 units on HD treatments.   Secondary Hyperparathyroidism: with hypocalcemia. Discontinued cinacalcet.   - Calcium carbonate  - Continue Auryxia.     LOS: [place] 9/16/202210:54 AM

## 2020-11-10 NOTE — Plan of Care (Signed)
Patient alert and oriented x , denies pain with assess. Dressing changed to right foot per orders. Vitals stable, no respiratory distress on 4 liters oxygen. Will continue to monitor.  Problem: Education: Goal: Knowledge of General Education information will improve Description: Including pain rating scale, medication(s)/side effects and non-pharmacologic comfort measures Outcome: Progressing   Problem: Activity: Goal: Risk for activity intolerance will decrease Outcome: Progressing   Problem: Pain Managment: Goal: General experience of comfort will improve Outcome: Progressing   Problem: Safety: Goal: Ability to remain free from injury will improve Outcome: Progressing   Problem: Skin Integrity: Goal: Risk for impaired skin integrity will decrease Outcome: Progressing

## 2020-11-10 NOTE — Progress Notes (Signed)
Physical Therapy Treatment Patient Details Name: [name] MRN: [id number] DOB: 1970-06-14 Today's Date: 11/10/2020   History of Present Illness [name]  is a 50 y.o. female presenting to hospital 8/21 with chest pain, fever, fatigue, and generalized malaise; also foul smell from R great toe (chronic ulcer).  Pt admitted with diabetic foot ulcers with gangrene of the 5th toe and osteomyelitis. S/p R LE angio 8/24; s/p partial R foot 5th toe amp 8/27; and s/p I&D with revisional debridement R foot 9/1.  Per PT order 9/2: pt [place] R foot. PMH includes DM, ESRD, anxiety, CHF, CKD, PNA, chronic respiratory failure with hypoxia and hypercapnia.    PT Comments    Patient received sitting up on side of bed, concerned about going home via private car. Educated regarding transfer to car. Practiced transfer to wheelchair. Patient is able to stand and pivot however does not keep full NWB on right. She will continue to benefit from skilled PT for improved strengthening and safety with transfers.      Recommendations for follow up therapy are one component of a multi-disciplinary discharge planning process, led by the attending physician.  Recommendations may be updated based on patient status, additional functional criteria and insurance authorization.  Follow Up Recommendations  Home health PT;Supervision/Assistance - 24 hour     Equipment Recommendations  Other (comment) (has all equipment needed)    Recommendations for Other Services       Precautions / Restrictions Precautions Precautions: Fall Precaution Comments: Post op shoe when EOB or OOB to protect dressing from floor Restrictions Weight Bearing Restrictions: Yes RLE Weight Bearing: Non weight bearing Other Position/Activity Restrictions: [place] R foot     Mobility  Bed Mobility               General bed mobility comments: patient sitting up on side of bed    Transfers Overall transfer level: Needs  assistance Equipment used: Rolling walker (2 wheeled) Transfers: Sit to/from [facility] Sit to Stand: Min guard Stand pivot transfers: Min guard       General transfer comment: requires education regarding car transfer then decided to use transport to get home. Able to stand pivot from bed to wheelchair, but is not able to maintain full NWB on right with this. Encouraged patient to perform lateral scoot transfers and transfer as minimally as possible to protect right foot.  Ambulation/Gait                 Stairs             Wheelchair Mobility    Modified Rankin (Stroke Patients Only)       Balance Overall balance assessment: Needs assistance Sitting-balance support: Feet supported Sitting balance-Leahy Scale: Good     Standing balance support: Bilateral upper extremity supported;During functional activity Standing balance-Leahy Scale: Fair Standing balance comment: able to stand with RW, but unable to maintain R NWB when standing                            Cognition Arousal/Alertness: Awake/alert Behavior During Therapy: WFL for tasks assessed/performed Overall Cognitive Status: Within Functional Limits for tasks assessed                                        Exercises      General Comments  Pertinent Vitals/Pain Pain Assessment: No/denies pain    Home Living                      Prior Function            PT Goals (current goals can now be found in the care plan section) Acute Rehab PT Goals Patient Stated Goal: to improve mobility PT Goal Formulation: With patient/family Time For Goal Achievement: 11/10/20 Potential to Achieve Goals: Fair Progress towards PT goals: Progressing toward goals    Frequency    Min 2X/week      PT Plan Current plan remains appropriate    Co-evaluation              AM-PAC PT "6 Clicks" Mobility   Outcome Measure  Help needed turning  from your back to your side while in a flat bed without using bedrails?: None Help needed moving from lying on your back to sitting on the side of a flat bed without using bedrails?: None Help needed moving to and from a bed to a chair (including a wheelchair)?: A Little Help needed standing up from a chair using your arms (e.g., wheelchair or bedside chair)?: A Little Help needed to walk in hospital room?: Total Help needed climbing 3-5 steps with a railing? : Total 6 Click Score: 16    End of Session Equipment Utilized During Treatment: Oxygen Activity Tolerance: Patient tolerated treatment well Patient left: in bed Nurse Communication: Precautions;Mobility status PT Visit Diagnosis: Other abnormalities of gait and mobility (R26.89);Muscle weakness (generalized) (M62.81);Pain Pain - Right/Left: Right Pain - part of body: Ankle and joints of foot     Time: 1315-1345 PT Time Calculation (min) (ACUTE ONLY): 30 min  Charges:  $Therapeutic Activity: 23-37 mins                     Marvene Strohm, PT, GCS 11/10/20,1:58 PM

## 2020-11-10 NOTE — Discharge Summary (Signed)
Physician Discharge Summary  [name]:[id number] DOB: [birth year] DOA: 10/15/2020  PCP: [name], NP  Admit date: 10/15/2020 Discharge date: 11/10/2020  Admitted From: Home Disposition: Home  Recommendations for Outpatient Follow-up:  Follow up with PCP in 1-2 weeks Please obtain BMP/CBC in one week Continue antibiotic course to be administered during dialysis If if patient's wound/infection worsened, ultimately she would need below the knee amputation. Follow-up at outpatient wound care center for continued wound care  Home Health: Home health PT, OT, RN Equipment/Devices: Bariatric 3 in 1, bariatric wheelchair, bariatric rolling walker  Discharge Condition: Stable CODE STATUS: Full code Diet recommendation: Heart healthy, carb modified  Brief/Interim Summary: Patient admitted with diabetic foot ulcers with gangrene of the fifth toe and osteomyelitis.  Past medical history chronic diastolic congestive heart failure, chronic hypoxic respiratory failure on 3 to 4 L of oxygen, end-stage renal disease with diabetes, morbid obesity.  On 10/21/2020 had amputation of the fifth ray and I&D of abscess on the right foot.  On 10/26/2020 had washout procedure.  The patient initially was on IV Zosyn.  Switched over to ceftazidime with dosing with dialysis at this point.  Specialist had recommended amputation but patient is not ready to make that decision at this point in time.   Patient has been hospitalized for extended period of time.  Multiple consulting services at this time of recommended lower extremity amputation however the patient has refused stating she wants to attempt a protracted course of antibiotics and seek out second opinion at tertiary care center.   The patient is remained hemodynamically stable.  Will require wound care.  Therapy recommendations were for skilled nursing facility however the patient declined all available bed offers.  Plan to discharge home with home  health services.  Home health nursing for wound care and physical therapy have been ordered.  Plan date of discharge 9/14.   Antibiotics to be administered with hemodialysis.  Antibiotic regimen include vancomycin and ceftazidime.  Last date of antibiotics will be 9/28.  Discharge Diagnoses:  Principal Problem:   Diabetic foot ulcer ([place]) Active Problems:   Diabetes ([place])   Obesity, Class III, BMI 40-49.9 (morbid obesity) ([place])   Essential hypertension   Type 2 diabetes mellitus with ESRD (end-stage renal disease) ([place])   DNR (do not resuscitate) discussion   End-stage renal disease on hemodialysis ([place])   Chronic respiratory failure with hypoxia ([facility])   Decreased pedal pulses   Gangrene ([facility])   PVD (peripheral vascular disease) ([place])   Aspiration into airway   Hearing loss of right ear   Hyponatremia   Anemia of chronic disease  Diabetic fold associate with gangrene of the fifth toe associated with osteomyelitis Status post amputation of fifth ray and I&D on 8/27 Washout procedure on 10/26/2020 ID and podiatry following Reaffirmed with patient, she is declining amputation at this time Encouraged her not to delay her decision and seek out surgical evaluation when she is ready to make the decision Plan: IV vancomycin and ceftazidime with dialysis Will need 4 weeks of therapy Antibiotic end date 9/28 Appreciate ID follow-up Outpatient antibiotic regimen in place.   Patient is medically stable for discharge at this time Recommendation was for skilled nursing facility Patient declined available bed offers Discharge home with home health services -Discussed with nephrology.  IV ceftazidime is on national back order and may be difficult to obtain at outpatient HD center.  As such, she has been switched to cefepime with dialysis.  This has  been arranged by nephrology.   ESRD on HD Nephrology consulted TTS dialysis schedule Follow nephrology recommendation for inpatient HD  needs Hemodialysis today 9/15   Diabetes mellitus type 2, uncontrolled Semglee 43 units nightly NovoLog 3 units 3 times daily with meals NovoLog and sliding scale Carb modified diet   Anemia of chronic kidney disease Stable, will monitor   Peripheral vascular disease Continue aspirin   Hyponatremia Overall sodium has began to improve with free water restriction and continue dialysis -She has been advised to limit her free water/ice intake   Chronic hypoxic respiratory failure At baseline, 3 to 4 L   Morbid obesity This complicates overall care and prognosis   Weakness Functional decline Recommendations were for skilled nursing facility -Patient electing to discharge home with home health services  Discharge Instructions  Discharge Instructions     Diet - low sodium heart healthy   Complete by: As directed    Discharge wound care:   Complete by: As directed    Please do Betadine wet-to-dry dressing changes once a day.  Take a 4 x 4 gauze soaked in Betadine applied directly to the open wound.  Secure it with dry 4 x 4 gauze and wrap it with Kerlix and Ace wrap.   Does not need to be packed with iodoform packing and ca be packed with Betadine gauze   Face-to-face encounter (required for Medicare/Medicaid patients)   Complete by: As directed    I Kathie Dike certify that this patient is under my care and that I, or a nurse practitioner or physician's assistant working with me, had a face-to-face encounter that meets the physician face-to-face encounter requirements with this patient on 11/08/2020. The encounter with the patient was in whole, or in part for the following medical condition(s) which is the primary reason for home health care (List medical condition): wound care, generalized weakness   The encounter with the patient was in whole, or in part, for the following medical condition, which is the primary reason for home health care: wound care, physical therapy   I  certify that, based on my findings, the following services are medically necessary home health services:  Nursing Physical therapy     Reason for Medically Necessary Home Health Services: Therapy- Therapeutic Exercises to Increase Strength and Endurance   My clinical findings support the need for the above services: Unable to leave home safely without assistance and/or assistive device   Further, I certify that my clinical findings support that this patient is homebound due to: Open/draining pressure/stasis ulcer   Home Health   Complete by: As directed    To provide the following care/treatments:  PT OT RN     Home infusion instructions   Complete by: As directed    Instructions: Flushing of vascular access device: 0.9% NaCl pre/post medication administration and prn patency; Heparin 100 u/ml, 1ml for implanted ports and Heparin 10u/ml, 63ml for all other central venous catheters.   Increase activity slowly   Complete by: As directed       Allergies as of 11/10/2020       Reactions   Dust Mite Extract Shortness Of Breath, Itching   Mold Extract [trichophyton] Shortness Of Breath, Itching   Pollen Extract Shortness Of Breath   Itching, shortness of breath, asthma like symptoms   Sulfa Antibiotics Itching   Feraheme [ferumoxytol] Itching   Uses benadryl so she can get the medicine        Medication List  STOP taking these medications    atorvastatin 80 MG tablet Commonly known as: LIPITOR   cinacalcet 60 MG tablet Commonly known as: SENSIPAR   cloNIDine 0.2 MG tablet Commonly known as: Catapres   losartan 50 MG tablet Commonly known as: COZAAR   Ozempic (1 MG/DOSE) 4 MG/3ML Sopn Generic drug: Semaglutide (1 MG/DOSE)       TAKE these medications    acetaminophen 500 MG tablet Commonly known as: TYLENOL Take 1,000 mg by mouth every 6 (six) hours as needed for mild pain, fever or headache.   albuterol 108 (90 Base) MCG/ACT inhaler Commonly known as:  VENTOLIN HFA Inhale 2 puffs into the lungs every 4 (four) hours as needed for wheezing or shortness of breath.   albuterol (2.5 MG/3ML) 0.083% nebulizer solution Commonly known as: PROVENTIL Take 3 mLs (2.5 mg total) by nebulization every 6 (six) hours as needed for wheezing or shortness of breath.   aspirin EC 81 MG tablet Take 81 mg by mouth daily.   b complex-vitamin c-folic acid 0.8 MG Tabs tablet Take 1 tablet by mouth at bedtime.   ceFEPime  IVPB Commonly known as: MAXIPIME Inject 2 g into the vein Every Tuesday,Thursday,and Saturday with dialysis for 11 days. To be given at HD center Indication:  necrotic diabetic foot infection Last Day of Therapy:  11/22/2020 Pre-HD vancomycin level 15-62mcg/ml or as otherwise directed per HD center protocol Labs weekly while on IV antibiotics: CBC w/ differential, CMP Fax weekly labs to  Dr. Donney Dice) [phone] Start taking on: November 11, 2020   Cholecalciferol 100 MCG (4000 UT) Caps Take 1 capsule by mouth once a week.   clindamycin 1 % lotion Commonly known as: CLEOCIN T Apply 1 application topically daily.   docusate sodium 100 MG capsule Commonly known as: COLACE Take 100 mg by mouth daily.   ELDERBERRY PO Take by mouth daily.   ferric citrate 1 GM 210 MG(Fe) tablet Commonly known as: AURYXIA Take 210 mg by mouth 3 (three) times daily with meals.   insulin aspart 100 UNIT/ML injection Commonly known as: novoLOG Inject 0-5 Units into the skin at bedtime.   insulin aspart 100 UNIT/ML injection Commonly known as: novoLOG Inject 0-15 Units into the skin 3 (three) times daily with meals.   insulin degludec 100 UNIT/ML FlexTouch Pen Commonly known as: TRESIBA Inject 43 Units into the skin daily at 10 pm. What changed: how much to take   ipratropium-albuterol 0.5-2.5 (3) MG/3ML Soln Commonly known as: DUONEB Take 3 mLs by nebulization every 6 (six) hours as needed.   lactulose 10 GM/15ML solution Commonly  known as: CHRONULAC Take 30 mLs (20 g total) by mouth daily as needed for severe constipation.   levocetirizine 5 MG tablet Commonly known as: XYZAL Take 5 mg by mouth at bedtime.   lidocaine-prilocaine cream Commonly known as: EMLA Apply topically as needed (For access for dialysis).   Linzess 290 MCG Caps capsule Generic drug: linaclotide Take 290 mcg by mouth every morning.   metoprolol tartrate 25 MG tablet Commonly known as: LOPRESSOR Take 1 tablet (25 mg total) by mouth 2 (two) times daily. What changed:  medication strength how much to take   midodrine 10 MG tablet Commonly known as: PROAMATINE Take 20 mg by mouth daily as needed.   omeprazole 20 MG capsule Commonly known as: PRILOSEC Take 20 mg by mouth daily.   oxyCODONE-acetaminophen 5-325 MG tablet Commonly known as: Percocet Take 1 tablet by mouth every 4 (four) hours as  needed for severe pain.   OXYGEN Inhale 2-4 L into the lungs.   Trelegy Ellipta 200-62.5-25 MCG/INH Aepb Generic drug: Fluticasone-Umeclidin-Vilant INHALE 1 PUFF BY MOUTH ONCE DAILY. APPOINTMENT NEEDED FOR FURTHER REFILLS.   vancomycin  IVPB Inject 1,000 mg into the vein Every Tuesday,Thursday,and Saturday with dialysis for 20 days. Indication:  necrotic diabetic foot infection To be given at HD center Last Day of Therapy:  11/22/2020 Pre-HD vancomycin level 15-60mcg/ml or as otherwise directed per HD center protocol Labs weekly while on IV antibiotics: CBC w/ differential, CMP Fax weekly labs to  Dr>[phone]               Home Infusion Instuctions  (From admission, onward)           Start     Ordered   11/08/20 0000  Home infusion instructions       Question:  Instructions  Answer:  Flushing of vascular access device: 0.9% NaCl pre/post medication administration and prn patency; Heparin 100 u/ml, 46ml for implanted ports and Heparin 10u/ml, 52ml for all other central venous catheters.   11/08/20 1518               Durable Medical Equipment  (From admission, onward)           Start     Ordered   11/06/20 1639  For home use only DME standard manual wheelchair with seat cushion  Once       Comments: Patient suffers from osteomyelitis which impairs their ability to perform daily activities like ADLs in the home.  A walking aide will not resolve issue with performing activities of daily living. A wheelchair will allow patient to safely perform daily activities. Patient can safely propel the wheelchair in the home or has a caregiver who can provide assistance. Length of need lifetime Accessories: elevating leg rests (ELRs), wheel locks, extensions and anti-tippers. Drop arm bariatric heavy duty, with Back Cushion   11/06/20 1642   10/19/20 1601  For home use only DME Walker rolling  Once       Comments: Due to body habitus a regular rolling walker will not work, will need a bariatric rolling walker  Question Answer Comment  Walker: With [place]   Patient needs a walker to treat with the following condition Weakness      10/19/20 1601              Discharge Care Instructions  (From admission, onward)           Start     Ordered   11/10/20 0000  Discharge wound care:       Comments: Please do Betadine wet-to-dry dressing changes once a day.  Take a 4 x 4 gauze soaked in Betadine applied directly to the open wound.  Secure it with dry 4 x 4 gauze and wrap it with Kerlix and Ace wrap.   Does not need to be packed with iodoform packing and ca be packed with Betadine gauze   11/10/20 1312            Allergies  Allergen Reactions   Dust Mite Extract Shortness Of Breath and Itching   Mold Extract [Trichophyton] Shortness Of Breath and Itching   Pollen Extract Shortness Of Breath    Itching, shortness of breath, asthma like symptoms   Sulfa Antibiotics Itching   Feraheme [Ferumoxytol] Itching    Uses benadryl so she can get the medicine     Consultations: Nephrology  Podiatry Infectious disease   Procedures/Studies: DG Chest 2 View  Result Date: 10/15/2020 CLINICAL DATA:  Chest pain. EXAM: CHEST - 2 VIEW COMPARISON:  Chest radiograph dated 02/12/2018. FINDINGS: No focal consolidation, pleural effusion or pneumothorax. The cardiac silhouette is within normal limits. No acute osseous pathology. IMPRESSION: No active cardiopulmonary disease. Electronically Signed   By: Anner Crete M.D.   On: 10/15/2020 19:48   MR FOOT RIGHT WO CONTRAST  Result Date: 10/17/2020 CLINICAL DATA:  History of diabetes and end-stage renal disease. Soft tissue wound of the lateral forefoot. EXAM: MRI OF THE RIGHT FOREFOOT WITHOUT CONTRAST TECHNIQUE: Multiplanar, multisequence MR imaging of the right foot was performed. No intravenous contrast was administered. COMPARISON:  None. FINDINGS: Bones/Joint/Cartilage Soft tissue wound of the fifth digit along the dorsal aspect. Bone marrow edema in the fifth metatarsal head and fifth proximal phalanx concerning for osteomyelitis. No acute fracture or dislocation. Normal alignment. No joint effusion. Mild osteoarthritis of the first MTP joint. Ligaments Collateral ligaments are intact. Lisfranc ligament is intact. Muscles and Tendons Flexor, peroneal and extensor compartment tendons are intact. There are small foci of low signal within the plantar musculature concerning for soft tissue air as can be seen with necrotizing infection. Mild edema within the plantar musculature. Soft tissue No fluid collection or hematoma. No soft tissue mass. Soft tissue edema throughout the dorsal aspect of the foot which may be reactive versus secondary to cellulitis. IMPRESSION: 1. Soft tissue wound of the fifth digit along the dorsal aspect. Bone marrow edema in the fifth metatarsal head and fifth proximal phalanx concerning for osteomyelitis. 2. There are small foci of low signal within the plantar musculature concerning for air  as can be seen with necrotizing infection. 3. Soft tissue edema throughout the dorsal aspect of the foot which may be reactive versus secondary to cellulitis. Electronically Signed   By: Kathreen Devoid M.D.   On: 10/17/2020 06:23   PERIPHERAL VASCULAR CATHETERIZATION  Result Date: 10/18/2020 See surgical note for result.  US ARTERIAL ABI (SCREENING LOWER EXTREMITY)  Result Date: 10/16/2020 CLINICAL DATA:  Bilateral lower extremity pain and claudication. Right lower extremity gangrene. Diabetes, right vascular ulcers, hypertension, hyperlipidemia. EXAM: NONINVASIVE PHYSIOLOGIC VASCULAR STUDY OF BILATERAL LOWER EXTREMITIES TECHNIQUE: Evaluation of both lower extremities were performed at rest, including calculation of ankle-brachial indices with single level Doppler, pressure and pulse volume recording. COMPARISON:  None. FINDINGS: Right ABI:  0.68 Left ABI:  0.59 Right Lower Extremity: Dorsalis pedis was noncompressible. Monophasic arterial waveforms at the ankle. Left Lower Extremity: Dorsalis pedis noncompressible. Monophasic waveforms at the ankle. IMPRESSION: Moderate bilateral lower extremity arterial occlusive disease at rest. Electronically Signed   By: Lucrezia Europe M.D.   On: 10/16/2020 12:29   DG Chest Port 1 View  Result Date: 11/09/2020 CLINICAL DATA:  Short of breath EXAM: PORTABLE CHEST 1 VIEW COMPARISON:  10/22/2020 FINDINGS: Single frontal view of the chest demonstrates a stable cardiac silhouette. There is chronic central vascular congestion, with persistent veiling opacity at the right lung base consistent with effusion and consolidation. No pneumothorax. IMPRESSION: 1. Right basilar consolidation and effusion. 2. Central vascular congestion. Electronically Signed   By: Randa Ngo M.D.   On: 11/09/2020 20:20   DG Chest Port 1 View  Result Date: 10/22/2020 CLINICAL DATA:  Dyspnea, cough. EXAM: PORTABLE CHEST 1 VIEW COMPARISON:  October 15, 2020. FINDINGS: The heart size and mediastinal  contours are within normal limits. No pneumothorax is noted. Mild left lingular subsegmental atelectasis is noted.  Right middle lobe opacity is noted concerning for atelectasis or pneumonia and possible right pleural effusion. The visualized skeletal structures are unremarkable. IMPRESSION: Right middle lobe opacity is noted concerning for atelectasis or pneumonia and possible right pleural effusion. Electronically Signed   By: Marijo Conception M.D.   On: 10/22/2020 11:33   DG Foot 2 Views Right  Result Date: 10/15/2020 CLINICAL DATA:  Fifth toe infection.  Evaluate for osteomyelitis. EXAM: RIGHT FOOT - 2 VIEW COMPARISON:  None. FINDINGS: There is no acute fracture or dislocation. No bone erosion or periosteal elevation to suggest acute osteomyelitis. The bones are osteopenic. Mild diffuse subcutaneous edema. Extensive vascular calcification of the foot. No radiopaque foreign object or soft tissue gas. IMPRESSION: 1. No acute fracture or dislocation. No radiographic evidence of acute osteomyelitis. 2. Osteopenia. Electronically Signed   By: Anner Crete M.D.   On: 10/15/2020 20:12   DG Foot Complete Right  Result Date: 10/21/2020 CLINICAL DATA:  Status post fifth metatarsal amputation. EXAM: RIGHT FOOT COMPLETE - 3+ VIEW COMPARISON:  None. FINDINGS: The patient is status post amputation of the distal fifth metatarsal and the fifth toe. Overlying dressings limit evaluation of fine cortical detail in these regions. No other acute abnormalities. Vascular calcifications are identified. IMPRESSION: Patient is status post amputation of the distal fifth metatarsal and the fifth toe. Electronically Signed   By: Dorise Bullion III M.D.   On: 10/21/2020 17:00      Subjective: No new complaints, feels ready to discharge home  Discharge Exam: Vitals:   11/10/20 0150 11/10/20 0517 11/10/20 0837 11/10/20 1213  BP:  124/69 130/74 126/71  Pulse: 76 90 93 89  Resp: 20 18 17 17   Temp:  98.1 F (36.7 C) 98.2  F (36.8 C)   TempSrc:  Oral Oral   SpO2:  100% 100% 99%  Weight:      Height:        General exam: Appears calm and comfortable  Respiratory system: Crackles at bases. Respiratory effort normal. Cardiovascular system: S1-S2, RRR, no murmurs, no pedal edema Gastrointestinal system: Obese, nontender, nondistended, normal bowel sounds Central nervous system: Alert and oriented. No focal neurological deficits. Extremities: Decreased power bilateral lower extremities Skin: Lower leg with skin darkening, significant nonpitting edema.  Right foot with significant wounds Psychiatry: Judgement and insight appear normal. Mood & affect appropriate.     The results of significant diagnostics from this hospitalization (including imaging, microbiology, ancillary and laboratory) are listed below for reference.     Microbiology: No results found for this or any previous visit (from the past 240 hour(s)).   Labs: BNP (last 3 results) No results for input(s): BNP in the last 8760 hours. Basic Metabolic Panel: Recent Labs  Lab 11/06/20 0408 11/08/20 1445 11/09/20 0412 11/10/20 0538  NA 123* 120* 121* 127*  K 4.0 4.2 4.3 4.2  CL 88* 84* 86* 91*  CO2 23 23 23 28   GLUCOSE 208* 193* 217* 78  BUN 52* 52* 59* 43*  CREATININE 8.04* 7.26* 8.00* 6.11*  CALCIUM 6.8* 6.9* 6.9* 7.3*  PHOS 3.9  --  5.0*  --    Liver Function Tests: Recent Labs  Lab 11/09/20 0412  ALBUMIN 2.7*   No results for input(s): LIPASE, AMYLASE in the last 168 hours. No results for input(s): AMMONIA in the last 168 hours. CBC: Recent Labs  Lab 11/06/20 0408 11/08/20 1445  WBC 12.5* 10.9*  HGB 7.5* 8.0*  HCT 23.6* 24.6*  MCV 95.9 95.3  PLT 318 304   Cardiac Enzymes: No results for input(s): CKTOTAL, CKMB, CKMBINDEX, TROPONINI in the last 168 hours. BNP: Invalid input(s): POCBNP CBG: Recent Labs  Lab 11/09/20 1519 11/09/20 2153 11/10/20 0839 11/10/20 1214 11/10/20 1633  GLUCAP 205* 175* 133* 91 207*    D-Dimer No results for input(s): DDIMER in the last 72 hours. Hgb A1c No results for input(s): HGBA1C in the last 72 hours. Lipid Profile No results for input(s): CHOL, HDL, LDLCALC, TRIG, CHOLHDL, LDLDIRECT in the last 72 hours. Thyroid function studies No results for input(s): TSH, T4TOTAL, T3FREE, THYROIDAB in the last 72 hours.  Invalid input(s): FREET3 Anemia work up No results for input(s): VITAMINB12, FOLATE, FERRITIN, TIBC, IRON, RETICCTPCT in the last 72 hours. Urinalysis    Component Value Date/Time   COLORURINE YELLOW (A) 04/02/2017 1548   APPEARANCEUR HAZY (A) 04/02/2017 1548   LABSPEC 1.008 04/02/2017 1548   PHURINE 5.0 04/02/2017 1548   GLUCOSEU NEGATIVE 04/02/2017 1548   HGBUR NEGATIVE 04/02/2017 1548   [place] 04/02/2017 1548   KETONESUR NEGATIVE 04/02/2017 1548   PROTEINUR 100 (A) 04/02/2017 1548   NITRITE NEGATIVE 04/02/2017 1548   LEUKOCYTESUR NEGATIVE 04/02/2017 1548   Sepsis Labs Invalid input(s): PROCALCITONIN,  WBC,  LACTICIDVEN Microbiology No results found for this or any previous visit (from the past 240 hour(s)).   Time coordinating discharge: 72mins  SIGNED:   Kathie Dike, MD  [facility] 11/10/2020, 8:40 PM   If 7PM-7AM, please contact night-coverage [email or web address]

## 2020-11-10 NOTE — TOC Progression Note (Signed)
Transition of Care [facility]) - Progression Note    Patient Details  Name: TRESSY KUNZMAN MRN: [id number] Date of Birth: 1970-03-09  Transition of Care [facility]) CM/SW [place], RN Phone Number: 11/10/2020, 2:04 PM  Clinical Narrative:    The patient to DC home with family, [place] set up [place] and PT, DME will be delivered to the home, She needs EMS transport for continuous oxygen needs as well as Non weight bearing status and obesity, [facility] to transport   Expected Discharge Plan: Home/Self Care Barriers to Discharge: SNF Pending bed offer (SNF bed search that can accomodate medical needs, wound care, and IV abx's. Bed search pending offers.)  Expected Discharge Plan and Services Expected Discharge Plan: Home/Self Care   Discharge Planning Services: CM Consult   Living arrangements for the past 2 months: Single Family Home Expected Discharge Date: 11/10/20               DME Arranged: Gilford Rile rolling DME Agency: [employer] Date DME Agency Contacted: 10/19/20 Time DME Agency Contacted: [phone] Representative spoke with at DME Agency: [place] ([place]) Interventions    Readmission Risk Interventions No flowsheet data found.

## 2020-11-10 NOTE — Care Management Important Message (Signed)
Important Message  Patient Details  Name: [name] MRN: [id number] Date of Birth: Jul 24, 1970   Medicare Important Message Given:  Yes     Dannette Barbara 11/10/2020, 1:27 PM

## 2020-11-11 DIAGNOSIS — T8743 Infection of amputation stump, right lower extremity: Secondary | ICD-10-CM | POA: Diagnosis not present

## 2020-11-11 DIAGNOSIS — Z992 Dependence on renal dialysis: Secondary | ICD-10-CM | POA: Diagnosis not present

## 2020-11-11 DIAGNOSIS — N186 End stage renal disease: Secondary | ICD-10-CM | POA: Diagnosis not present

## 2020-11-13 ENCOUNTER — Encounter: Payer: Self-pay | Admitting: *Deleted

## 2020-11-13 ENCOUNTER — Other Ambulatory Visit: Payer: Self-pay

## 2020-11-13 DIAGNOSIS — E1122 Type 2 diabetes mellitus with diabetic chronic kidney disease: Secondary | ICD-10-CM | POA: Diagnosis not present

## 2020-11-13 DIAGNOSIS — J9811 Atelectasis: Secondary | ICD-10-CM | POA: Diagnosis not present

## 2020-11-13 DIAGNOSIS — I132 Hypertensive heart and chronic kidney disease with heart failure and with stage 5 chronic kidney disease, or end stage renal disease: Secondary | ICD-10-CM | POA: Insufficient documentation

## 2020-11-13 DIAGNOSIS — Z20822 Contact with and (suspected) exposure to covid-19: Secondary | ICD-10-CM | POA: Insufficient documentation

## 2020-11-13 DIAGNOSIS — N186 End stage renal disease: Secondary | ICD-10-CM | POA: Insufficient documentation

## 2020-11-13 DIAGNOSIS — J45909 Unspecified asthma, uncomplicated: Secondary | ICD-10-CM | POA: Insufficient documentation

## 2020-11-13 DIAGNOSIS — Z7982 Long term (current) use of aspirin: Secondary | ICD-10-CM | POA: Diagnosis not present

## 2020-11-13 DIAGNOSIS — Z992 Dependence on renal dialysis: Secondary | ICD-10-CM | POA: Insufficient documentation

## 2020-11-13 DIAGNOSIS — R531 Weakness: Secondary | ICD-10-CM | POA: Diagnosis not present

## 2020-11-13 DIAGNOSIS — J90 Pleural effusion, not elsewhere classified: Secondary | ICD-10-CM | POA: Diagnosis not present

## 2020-11-13 DIAGNOSIS — J811 Chronic pulmonary edema: Secondary | ICD-10-CM | POA: Diagnosis not present

## 2020-11-13 DIAGNOSIS — I5033 Acute on chronic diastolic (congestive) heart failure: Secondary | ICD-10-CM | POA: Diagnosis not present

## 2020-11-13 DIAGNOSIS — Z794 Long term (current) use of insulin: Secondary | ICD-10-CM | POA: Insufficient documentation

## 2020-11-13 DIAGNOSIS — I120 Hypertensive chronic kidney disease with stage 5 chronic kidney disease or end stage renal disease: Secondary | ICD-10-CM | POA: Diagnosis not present

## 2020-11-13 DIAGNOSIS — J449 Chronic obstructive pulmonary disease, unspecified: Secondary | ICD-10-CM | POA: Diagnosis not present

## 2020-11-13 DIAGNOSIS — R42 Dizziness and giddiness: Secondary | ICD-10-CM | POA: Diagnosis not present

## 2020-11-13 DIAGNOSIS — Z79899 Other long term (current) drug therapy: Secondary | ICD-10-CM | POA: Diagnosis not present

## 2020-11-13 DIAGNOSIS — R0602 Shortness of breath: Secondary | ICD-10-CM | POA: Diagnosis not present

## 2020-11-13 LAB — CBC
HCT: 26.8 % — ABNORMAL LOW (ref 36.0–46.0)
Hemoglobin: 8.3 g/dL — ABNORMAL LOW (ref 12.0–15.0)
MCH: 30.2 pg (ref 26.0–34.0)
MCHC: 31 g/dL (ref 30.0–36.0)
MCV: 97.5 fL (ref 80.0–100.0)
Platelets: 405 10*3/uL — ABNORMAL HIGH (ref 150–400)
RBC: 2.75 MIL/uL — ABNORMAL LOW (ref 3.87–5.11)
RDW: 15.6 % — ABNORMAL HIGH (ref 11.5–15.5)
WBC: 11.4 10*3/uL — ABNORMAL HIGH (ref 4.0–10.5)
nRBC: 0 % (ref 0.0–0.2)

## 2020-11-13 NOTE — ED Triage Notes (Addendum)
Pt brought in  via ems from home with dizziness.  Pt was discharged from [facility] 3 days ago after a lengthy stay per ems.  Pt on 6 liters [place].   Last dialysis was 2 days ago.   Pt alert  speech clear.

## 2020-11-14 ENCOUNTER — Emergency Department: Payer: Medicare HMO

## 2020-11-14 ENCOUNTER — Encounter: Payer: Self-pay | Admitting: Internal Medicine

## 2020-11-14 ENCOUNTER — Observation Stay
Admission: EM | Admit: 2020-11-14 | Discharge: 2020-11-16 | Disposition: A | Discharge status: Home / Self Care | Payer: Medicare HMO | Attending: Internal Medicine | Admitting: Internal Medicine

## 2020-11-14 DIAGNOSIS — R0602 Shortness of breath: Secondary | ICD-10-CM | POA: Diagnosis not present

## 2020-11-14 DIAGNOSIS — I5033 Acute on chronic diastolic (congestive) heart failure: Secondary | ICD-10-CM | POA: Diagnosis not present

## 2020-11-14 DIAGNOSIS — M868X9 Other osteomyelitis, unspecified sites: Secondary | ICD-10-CM | POA: Diagnosis not present

## 2020-11-14 DIAGNOSIS — D638 Anemia in other chronic diseases classified elsewhere: Secondary | ICD-10-CM | POA: Diagnosis present

## 2020-11-14 DIAGNOSIS — E871 Hypo-osmolality and hyponatremia: Secondary | ICD-10-CM | POA: Diagnosis present

## 2020-11-14 DIAGNOSIS — E8770 Fluid overload, unspecified: Secondary | ICD-10-CM | POA: Diagnosis not present

## 2020-11-14 DIAGNOSIS — J90 Pleural effusion, not elsewhere classified: Secondary | ICD-10-CM | POA: Diagnosis not present

## 2020-11-14 DIAGNOSIS — E662 Morbid (severe) obesity with alveolar hypoventilation: Secondary | ICD-10-CM | POA: Diagnosis present

## 2020-11-14 DIAGNOSIS — J810 Acute pulmonary edema: Secondary | ICD-10-CM

## 2020-11-14 DIAGNOSIS — R42 Dizziness and giddiness: Secondary | ICD-10-CM | POA: Diagnosis not present

## 2020-11-14 DIAGNOSIS — I10 Essential (primary) hypertension: Secondary | ICD-10-CM | POA: Diagnosis not present

## 2020-11-14 DIAGNOSIS — E1122 Type 2 diabetes mellitus with diabetic chronic kidney disease: Secondary | ICD-10-CM | POA: Diagnosis not present

## 2020-11-14 DIAGNOSIS — J9620 Acute and chronic respiratory failure, unspecified whether with hypoxia or hypercapnia: Secondary | ICD-10-CM | POA: Diagnosis present

## 2020-11-14 DIAGNOSIS — J9811 Atelectasis: Secondary | ICD-10-CM | POA: Diagnosis not present

## 2020-11-14 DIAGNOSIS — I739 Peripheral vascular disease, unspecified: Secondary | ICD-10-CM | POA: Diagnosis present

## 2020-11-14 DIAGNOSIS — N186 End stage renal disease: Secondary | ICD-10-CM | POA: Diagnosis not present

## 2020-11-14 DIAGNOSIS — N2581 Secondary hyperparathyroidism of renal origin: Secondary | ICD-10-CM | POA: Diagnosis not present

## 2020-11-14 DIAGNOSIS — I120 Hypertensive chronic kidney disease with stage 5 chronic kidney disease or end stage renal disease: Secondary | ICD-10-CM | POA: Diagnosis not present

## 2020-11-14 DIAGNOSIS — J811 Chronic pulmonary edema: Secondary | ICD-10-CM | POA: Diagnosis not present

## 2020-11-14 DIAGNOSIS — D631 Anemia in chronic kidney disease: Secondary | ICD-10-CM | POA: Diagnosis not present

## 2020-11-14 LAB — RESP PANEL BY RT-PCR (FLU A&B, COVID) ARPGX2
Influenza A by PCR: NEGATIVE
Influenza B by PCR: NEGATIVE
SARS Coronavirus 2 by RT PCR: NEGATIVE

## 2020-11-14 LAB — BASIC METABOLIC PANEL
Anion gap: 14 (ref 5–15)
BUN: 57 mg/dL — ABNORMAL HIGH (ref 6–20)
CO2: 25 mmol/L (ref 22–32)
Calcium: 7.4 mg/dL — ABNORMAL LOW (ref 8.9–10.3)
Chloride: 92 mmol/L — ABNORMAL LOW (ref 98–111)
Creatinine, Ser: 7.9 mg/dL — ABNORMAL HIGH (ref 0.44–1.00)
GFR, Estimated: 6 mL/min — ABNORMAL LOW (ref >60)
Glucose, Bld: 128 mg/dL — ABNORMAL HIGH (ref 70–99)
Potassium: 4.5 mmol/L (ref 3.5–5.1)
Sodium: 131 mmol/L — ABNORMAL LOW (ref 135–145)

## 2020-11-14 LAB — CBG MONITORING, ED
Glucose-Capillary: 230 mg/dL — ABNORMAL HIGH (ref 70–99)
Glucose-Capillary: 244 mg/dL — ABNORMAL HIGH (ref 70–99)

## 2020-11-14 LAB — TROPONIN I (HIGH SENSITIVITY)
Troponin I (High Sensitivity): 21 ng/L — ABNORMAL HIGH (ref <18)
Troponin I (High Sensitivity): 22 ng/L — ABNORMAL HIGH (ref <18)

## 2020-11-14 LAB — GLUCOSE, CAPILLARY: Glucose-Capillary: 85 mg/dL (ref 70–99)

## 2020-11-14 IMAGING — CR DG CHEST 2V
2 series · 2 of 2 positions shown · non-contrast
Comparison: [DATE]

CLINICAL DATA: Shortness of breath.

EXAM:
CHEST - 2 VIEW

[chest ap]
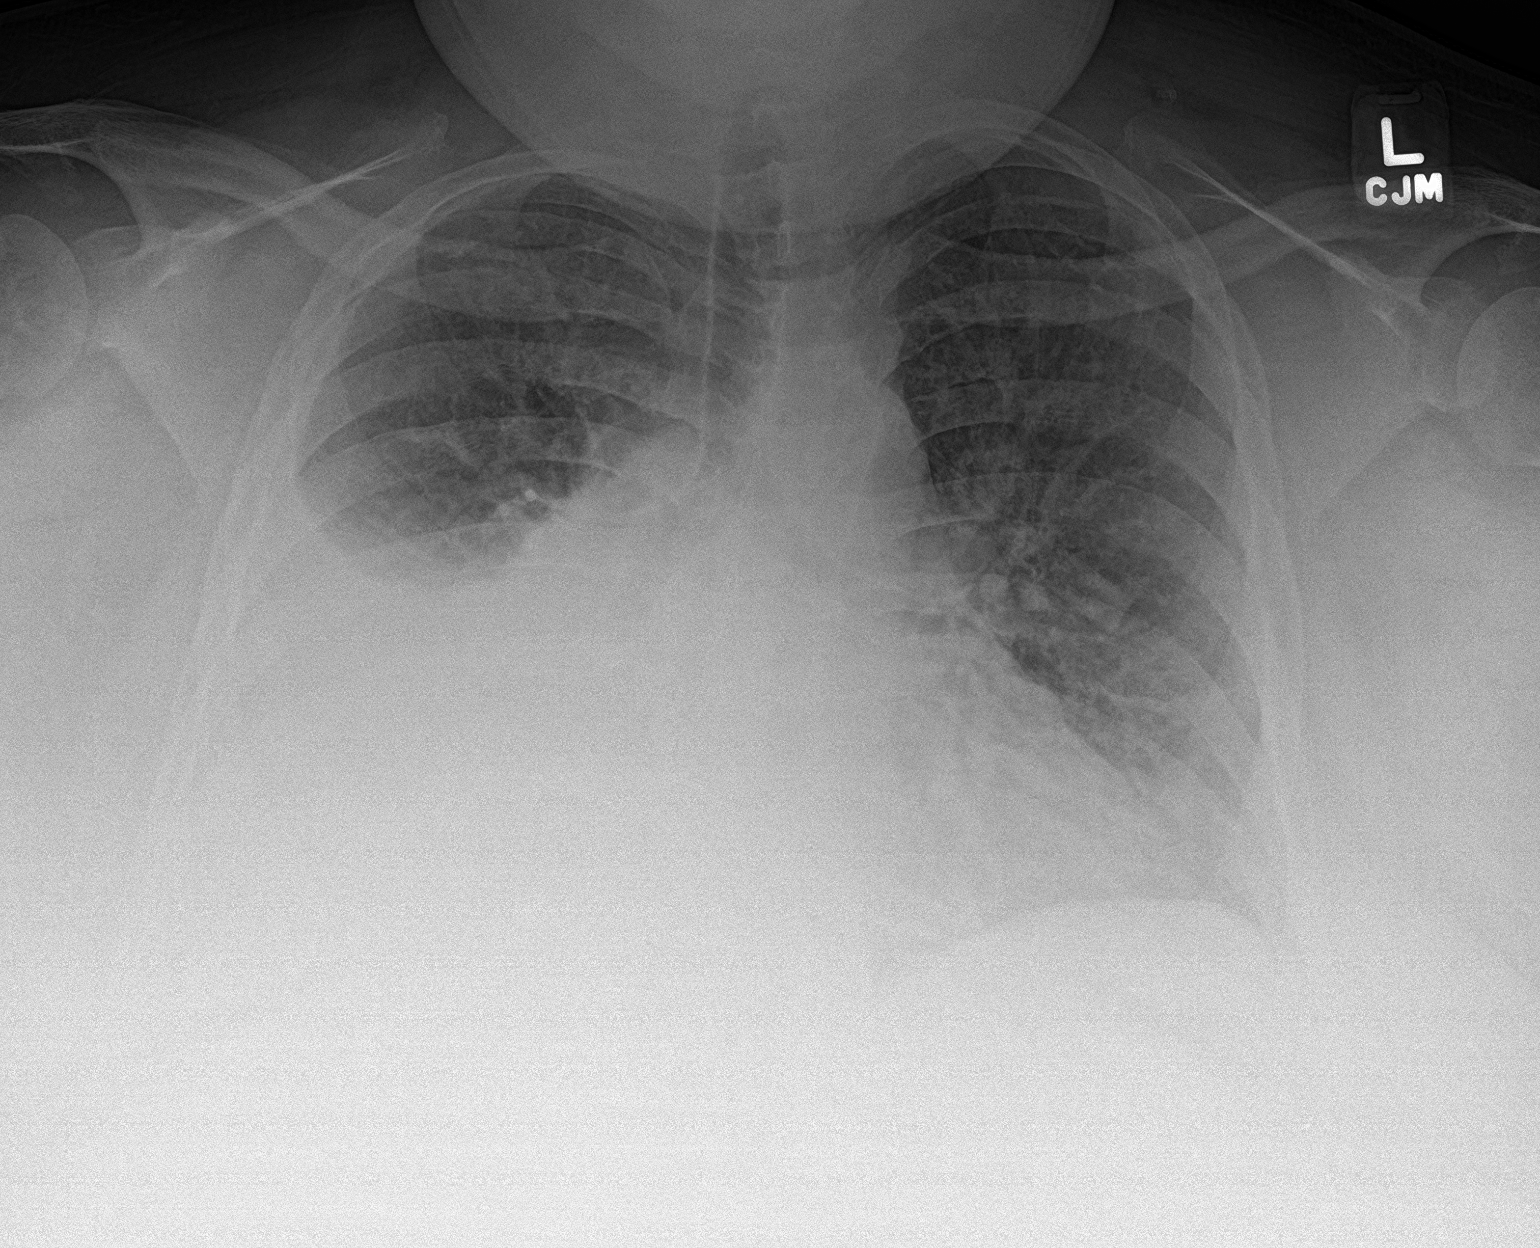

[chest lat]
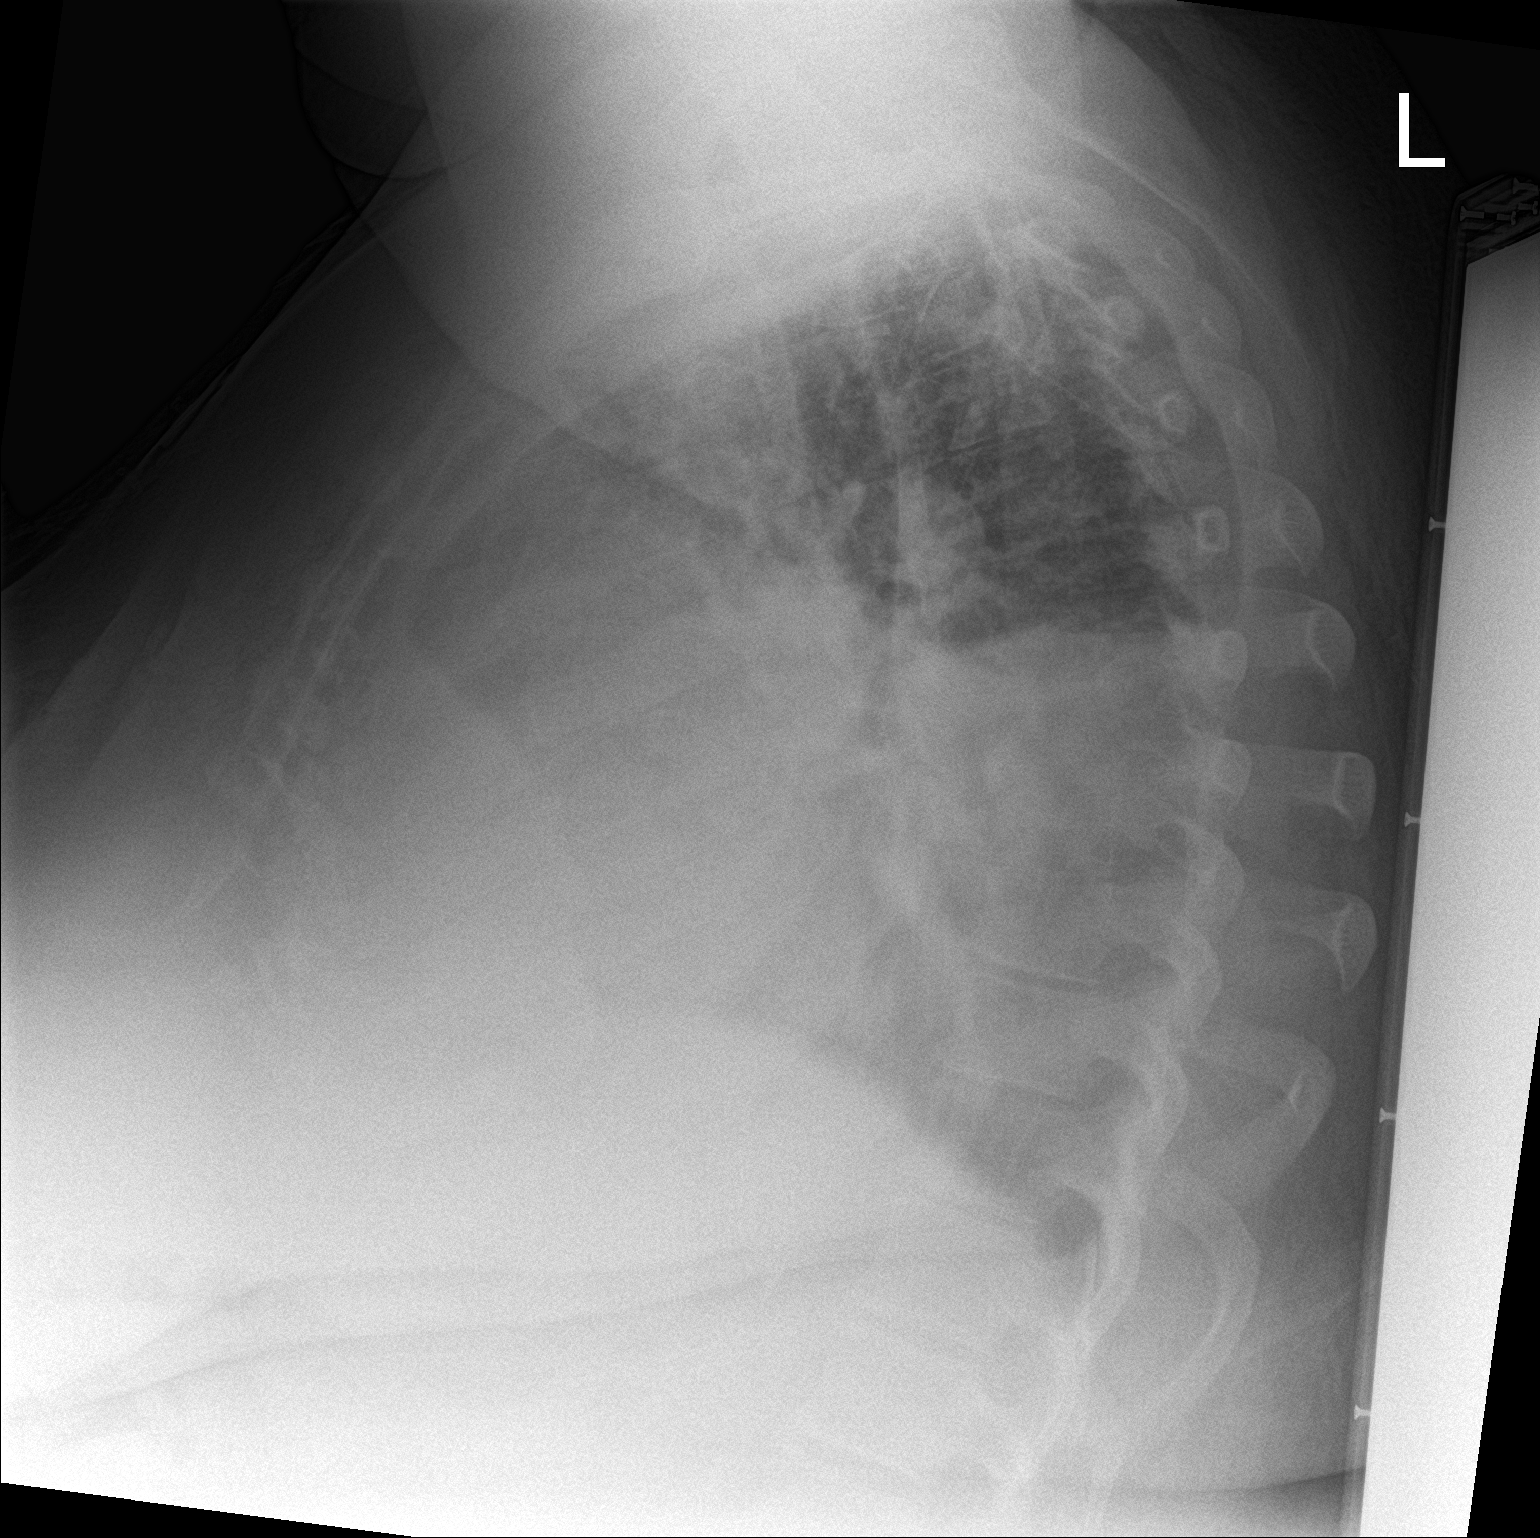

[2 of 2 positions shown; findings below may reference images not displayed]

FINDINGS: Stable cardiomediastinal contours. There has been significant
interval increase in volume of right pleural effusion. Similar
pulmonary vascular congestion. Mild atelectasis noted in the left
lower lobe.
IMPRESSION: 1. Significant increase in volume of right pleural effusion.
2. Pulmonary vascular congestion.

## 2020-11-14 MED ORDER — PENTAFLUOROPROP-TETRAFLUOROETH EX AERO
1.0000 "application " | INHALATION_SPRAY | CUTANEOUS | Status: DC | PRN
  Filled 2020-11-14: qty 30

## 2020-11-14 MED ORDER — INSULIN GLARGINE-YFGN 100 UNIT/ML ~~LOC~~ SOLN
34.0000 [IU] | Freq: Every day | SUBCUTANEOUS | Status: DC
  Administered 2020-11-14 – 2020-11-16 (×2): 34 [IU] via SUBCUTANEOUS
  Filled 2020-11-14 (×3): qty 0.34

## 2020-11-14 MED ORDER — ACETAMINOPHEN 500 MG PO TABS
1000.0000 mg | ORAL_TABLET | Freq: Four times a day (QID) | ORAL | Status: DC | PRN
  Administered 2020-11-14 – 2020-11-16 (×3): 1000 mg via ORAL
  Filled 2020-11-14 (×3): qty 2

## 2020-11-14 MED ORDER — VANCOMYCIN IV (FOR PTA / DISCHARGE USE ONLY): 1000.0000 mg | INTRAVENOUS | Status: DC

## 2020-11-14 MED ORDER — ACETAMINOPHEN 325 MG PO TABS
650.0000 mg | ORAL_TABLET | Freq: Four times a day (QID) | ORAL | Status: DC | PRN
  Administered 2020-11-14: 650 mg via ORAL

## 2020-11-14 MED ORDER — ALTEPLASE 2 MG IJ SOLR
2.0000 mg | Freq: Once | INTRAMUSCULAR | Status: DC | PRN
  Filled 2020-11-14: qty 2

## 2020-11-14 MED ORDER — LORATADINE 10 MG PO TABS
10.0000 mg | ORAL_TABLET | Freq: Every day | ORAL | Status: DC
  Administered 2020-11-15 – 2020-11-16 (×2): 10 mg via ORAL
  Filled 2020-11-14 (×2): qty 1

## 2020-11-14 MED ORDER — FLUTICASONE-UMECLIDIN-VILANT 200-62.5-25 MCG/INH IN AEPB: INHALATION_SPRAY | Freq: Every day | RESPIRATORY_TRACT | Status: DC

## 2020-11-14 MED ORDER — LINACLOTIDE 290 MCG PO CAPS
290.0000 ug | ORAL_CAPSULE | Freq: Every morning | ORAL | Status: DC
  Administered 2020-11-15 – 2020-11-16 (×2): 290 ug via ORAL
  Filled 2020-11-14 (×2): qty 1

## 2020-11-14 MED ORDER — UMECLIDINIUM BROMIDE 62.5 MCG/INH IN AEPB
1.0000 | INHALATION_SPRAY | Freq: Every day | RESPIRATORY_TRACT | Status: DC
  Administered 2020-11-16: 1 via RESPIRATORY_TRACT
  Filled 2020-11-14: qty 7

## 2020-11-14 MED ORDER — SODIUM CHLORIDE 0.9 % IV SOLN: 100.0000 mL | INTRAVENOUS | Status: DC | PRN

## 2020-11-14 MED ORDER — IPRATROPIUM-ALBUTEROL 0.5-2.5 (3) MG/3ML IN SOLN
3.0000 mL | Freq: Three times a day (TID) | RESPIRATORY_TRACT | Status: DC
  Administered 2020-11-14 – 2020-11-16 (×5): 3 mL via RESPIRATORY_TRACT
  Filled 2020-11-14 (×5): qty 3

## 2020-11-14 MED ORDER — INSULIN ASPART 100 UNIT/ML IJ SOLN
0.0000 [IU] | Freq: Three times a day (TID) | INTRAMUSCULAR | Status: DC
  Administered 2020-11-14: 2 [IU] via SUBCUTANEOUS
  Administered 2020-11-15: 1 [IU] via SUBCUTANEOUS
  Administered 2020-11-15: 3 [IU] via SUBCUTANEOUS
  Filled 2020-11-14 (×3): qty 1

## 2020-11-14 MED ORDER — SODIUM CHLORIDE 0.9 % IV SOLN
1.0000 g | Freq: Once | INTRAVENOUS | Status: AC
  Administered 2020-11-14: 1 g via INTRAVENOUS
  Filled 2020-11-14 (×2): qty 1

## 2020-11-14 MED ORDER — EPOETIN ALFA 10000 UNIT/ML IJ SOLN
10000.0000 [IU] | Freq: Once | INTRAMUSCULAR | Status: AC
  Administered 2020-11-14: 10000 [IU] via SUBCUTANEOUS
  Filled 2020-11-14: qty 1

## 2020-11-14 MED ORDER — VANCOMYCIN HCL IN DEXTROSE 1-5 GM/200ML-% IV SOLN
1000.0000 mg | INTRAVENOUS | Status: DC
  Administered 2020-11-16: 1000 mg via INTRAVENOUS
  Filled 2020-11-14 (×2): qty 200

## 2020-11-14 MED ORDER — LEVOCETIRIZINE DIHYDROCHLORIDE 5 MG PO TABS: 5.0000 mg | ORAL_TABLET | Freq: Every day | ORAL | Status: DC

## 2020-11-14 MED ORDER — SODIUM CHLORIDE 0.9% FLUSH
3.0000 mL | Freq: Two times a day (BID) | INTRAVENOUS | Status: DC
  Administered 2020-11-15 – 2020-11-16 (×2): 3 mL via INTRAVENOUS

## 2020-11-14 MED ORDER — OXYCODONE-ACETAMINOPHEN 5-325 MG PO TABS
1.0000 | ORAL_TABLET | ORAL | Status: DC | PRN
  Administered 2020-11-14 – 2020-11-16 (×4): 1 via ORAL
  Filled 2020-11-14 (×4): qty 1

## 2020-11-14 MED ORDER — ASPIRIN EC 81 MG PO TBEC
81.0000 mg | DELAYED_RELEASE_TABLET | Freq: Every day | ORAL | Status: DC
  Administered 2020-11-15 – 2020-11-16 (×2): 81 mg via ORAL
  Filled 2020-11-14 (×2): qty 1

## 2020-11-14 MED ORDER — INSULIN DEGLUDEC 100 UNIT/ML ~~LOC~~ SOPN: 43.0000 [IU] | PEN_INJECTOR | Freq: Every day | SUBCUTANEOUS | Status: DC

## 2020-11-14 MED ORDER — SODIUM CHLORIDE 0.9% FLUSH: 3.0000 mL | INTRAVENOUS | Status: DC | PRN

## 2020-11-14 MED ORDER — VITAMIN D 25 MCG (1000 UNIT) PO TABS: 4000.0000 [IU] | ORAL_TABLET | ORAL | Status: DC

## 2020-11-14 MED ORDER — DOCUSATE SODIUM 100 MG PO CAPS
100.0000 mg | ORAL_CAPSULE | Freq: Every day | ORAL | Status: DC
  Administered 2020-11-15 – 2020-11-16 (×2): 100 mg via ORAL
  Filled 2020-11-14 (×2): qty 1

## 2020-11-14 MED ORDER — LIDOCAINE HCL (PF) 1 % IJ SOLN
5.0000 mL | INTRAMUSCULAR | Status: DC | PRN
  Filled 2020-11-14: qty 5

## 2020-11-14 MED ORDER — CEFEPIME IV (FOR PTA / DISCHARGE USE ONLY): 2.0000 g | INTRAVENOUS | Status: DC

## 2020-11-14 MED ORDER — ONDANSETRON HCL 4 MG PO TABS: 4.0000 mg | ORAL_TABLET | Freq: Four times a day (QID) | ORAL | Status: DC | PRN

## 2020-11-14 MED ORDER — FLUTICASONE FUROATE-VILANTEROL 200-25 MCG/INH IN AEPB
1.0000 | INHALATION_SPRAY | Freq: Every day | RESPIRATORY_TRACT | Status: DC
  Administered 2020-11-16: 1 via RESPIRATORY_TRACT
  Filled 2020-11-14: qty 28

## 2020-11-14 MED ORDER — METOPROLOL TARTRATE 25 MG PO TABS
25.0000 mg | ORAL_TABLET | Freq: Two times a day (BID) | ORAL | Status: DC
  Administered 2020-11-14 – 2020-11-16 (×3): 25 mg via ORAL
  Filled 2020-11-14 (×4): qty 1

## 2020-11-14 MED ORDER — MIDODRINE HCL 5 MG PO TABS: 20.0000 mg | ORAL_TABLET | Freq: Every day | ORAL | Status: DC | PRN

## 2020-11-14 MED ORDER — LIDOCAINE-PRILOCAINE 2.5-2.5 % EX KIT: PACK | CUTANEOUS | Status: DC | PRN

## 2020-11-14 MED ORDER — SODIUM CHLORIDE 0.9 % IV SOLN: 250.0000 mL | INTRAVENOUS | Status: DC | PRN

## 2020-11-14 MED ORDER — HEPARIN SODIUM (PORCINE) 5000 UNIT/ML IJ SOLN
5000.0000 [IU] | Freq: Three times a day (TID) | INTRAMUSCULAR | Status: DC
  Administered 2020-11-14 – 2020-11-16 (×6): 5000 [IU] via SUBCUTANEOUS
  Filled 2020-11-14 (×6): qty 1

## 2020-11-14 MED ORDER — ALBUTEROL SULFATE (2.5 MG/3ML) 0.083% IN NEBU
2.5000 mg | INHALATION_SOLUTION | Freq: Four times a day (QID) | RESPIRATORY_TRACT | Status: DC | PRN
  Administered 2020-11-14 – 2020-11-16 (×3): 2.5 mg via RESPIRATORY_TRACT
  Filled 2020-11-14 (×3): qty 3

## 2020-11-14 MED ORDER — PANTOPRAZOLE SODIUM 40 MG PO TBEC
40.0000 mg | DELAYED_RELEASE_TABLET | Freq: Every day | ORAL | Status: DC
  Administered 2020-11-15 – 2020-11-16 (×2): 40 mg via ORAL
  Filled 2020-11-14 (×2): qty 1

## 2020-11-14 MED ORDER — VANCOMYCIN HCL IN DEXTROSE 1-5 GM/200ML-% IV SOLN
1000.0000 mg | Freq: Once | INTRAVENOUS | Status: AC
  Administered 2020-11-14: 1000 mg via INTRAVENOUS
  Filled 2020-11-14: qty 200

## 2020-11-14 MED ORDER — CHLORHEXIDINE GLUCONATE CLOTH 2 % EX PADS
6.0000 | MEDICATED_PAD | Freq: Every day | CUTANEOUS | Status: DC
  Administered 2020-11-15 – 2020-11-16 (×2): 6 via TOPICAL
  Filled 2020-11-14 (×3): qty 6

## 2020-11-14 MED ORDER — SODIUM CHLORIDE 0.9 % IV SOLN
2.0000 g | INTRAVENOUS | Status: DC
  Administered 2020-11-16: 2 g via INTRAVENOUS
  Filled 2020-11-14: qty 2

## 2020-11-14 MED ORDER — FERRIC CITRATE 1 GM 210 MG(FE) PO TABS
210.0000 mg | ORAL_TABLET | Freq: Three times a day (TID) | ORAL | Status: DC
  Administered 2020-11-15 – 2020-11-16 (×3): 210 mg via ORAL
  Filled 2020-11-14 (×7): qty 1

## 2020-11-14 MED ORDER — HEPARIN SODIUM (PORCINE) 1000 UNIT/ML DIALYSIS
1000.0000 [IU] | INTRAMUSCULAR | Status: DC | PRN
  Filled 2020-11-14: qty 1

## 2020-11-14 MED ORDER — LIDOCAINE-PRILOCAINE 2.5-2.5 % EX CREA
1.0000 "application " | TOPICAL_CREAM | CUTANEOUS | Status: DC | PRN
  Filled 2020-11-14: qty 5

## 2020-11-14 MED ORDER — RENA-VITE PO TABS
1.0000 | ORAL_TABLET | Freq: Every day | ORAL | Status: DC
  Administered 2020-11-16: 1 via ORAL
  Filled 2020-11-14 (×3): qty 1

## 2020-11-14 MED ORDER — SODIUM CHLORIDE 0.9 % IV SOLN
1.0000 g | Freq: Once | INTRAVENOUS | Status: AC
  Administered 2020-11-14: 1 g via INTRAVENOUS

## 2020-11-14 MED ORDER — ONDANSETRON HCL 4 MG/2ML IJ SOLN: 4.0000 mg | Freq: Four times a day (QID) | INTRAMUSCULAR | Status: DC | PRN

## 2020-11-14 NOTE — Progress Notes (Signed)
Hemodialysis patient known at [place] 6:00am. Patient stated that she rides with [facility] and has no transportation or dialysis concerns. Please contact me with any dialysis placement concerns.   Elvera Bicker Dialysis Coordinator [phone]

## 2020-11-14 NOTE — ED Provider Notes (Signed)
[facility] Emergency Department Provider Note  ____________________________________________   Event Date/Time   First MD Initiated Contact with Patient 11/14/20 [phone]     (approximate)  I have reviewed the triage vital signs and the nursing notes.   HISTORY  Chief Complaint Dizziness    HPI [name] is a 50 y.o. female with extensive past medical history as below including chronic hypoxic respiratory failure, end-stage renal disease, recent amputation for osteomyelitis of the toe here with shortness of breath and mild lightheadedness.  The patient states that since she was hospitalized, she has gained approximately 20 pounds of fluid weight.  She has had associated abdominal swelling, shortness of breath, and orthopnea.  She has felt incredibly winded with any amount of movement.  She states the foot itself seems to be improving well and she denies any drainage or increased pain.  She states that earlier today, she began to feel lightheaded and dizzy.  She felt like her heart was beating quickly.  She had taken her metoprolol prior to this and checked her blood pressure and it was in the 90s over 50s, which is low for her.  She took her midodrine and her symptoms have improved.  Otherwise, denies any chest pain..  She currently feels persistently shortness of breath, but otherwise is without complaints.  No fevers.  No sputum production.    Past Medical History:  Diagnosis Date   (HFpEF) heart failure with preserved ejection fraction ([place])    a. 11/2014 Echo: EF 50-55%, Gr1 DD; b. 06/2016 Echo: EF 60-65%, no rwma, mod dil LA, nl RV, triv eff.   Anxiety    Asthma    Bronchitis    CHF (congestive heart failure) (HCC)    Chronic cough    Chronic respiratory failure with hypoxia and hypercapnia ([place])    a. Home O2.  (2-4L)   CKD (chronic kidney disease), stage III (HCC)    Depression    Diabetes mellitus without complication (HCC)    Diverticulosis     Dyspnea    Dysrhythmia    per pt, no arrythmias   Environmental and seasonal allergies    Extreme obesity with alveolar hypoventilation (HCC)    Hypertension    Iron deficiency anemia    a. chronic blood loss->heavy menses.   Kidney failure    dialysis tues/thur/sat   ... left arm shunt   Orthopnea    Oxygen dependent    3l/min   Pericardial effusion    a. 11/2014 CT Chest: Mod effusion; b. 11/2014 Echo: small to mod circumferential effusion. No hemodynamic compromise.   Pneumonia    history   Poorly controlled diabetes mellitus ([place])    a. 11/2014 A1c 13.2.   Swelling of both lower extremities     Patient Active Problem List   Diagnosis Date Noted   Acute on chronic diastolic CHF (congestive heart failure) ([place]) 11/14/2020   Anemia of chronic disease    Hyponatremia    Gangrene (HCC)    PVD (peripheral vascular disease) (HCC)    Aspiration into airway    Hearing loss of right ear    Diabetic foot ulcer ([place]) 10/15/2020   Decreased pedal pulses 10/15/2020   Chronic respiratory failure with hypoxia (HCC)    Extreme obesity with alveolar hypoventilation ([place])    Palliative care by specialist    Goals of care, counseling/discussion    End-stage renal disease on hemodialysis ([place])    DNR (do not resuscitate) discussion  Adjustment disorder with mixed anxiety and depressed mood 03/31/2017   Dysthymia 03/31/2017   Chronic pain syndrome 03/31/2017   Palliative care encounter    Obesity hypoventilation syndrome ([place]) 03/09/2017   Chronic diastolic heart failure ([place]) 01/07/2017   Depression 09/24/2016   Acute on chronic diastolic heart failure ([place]) 07/04/2016   COPD with chronic bronchitis ([place]) 07/04/2016   Acute on chronic respiratory failure ([place]) 06/26/2016   CKD (chronic kidney disease), stage III ([place]) 06/26/2016   COPD exacerbation ([place]) 03/25/2016   Anxiety 10/02/2015   Asthma 08/30/2015   Type 2 diabetes mellitus with ESRD (end-stage renal disease) ([place])  03/13/2015   Iron deficiency anemia 02/17/2015   Obesity, Class III, BMI 40-49.9 (morbid obesity) ([place])    Shortness of breath    Essential hypertension    Hypertensive heart disease with congestive heart failure ([place]) 12/18/2014   Diabetes mellitus with ESRD (end-stage renal disease) ([place]) 12/15/2014    Past Surgical History:  Procedure Laterality Date   A/V FISTULAGRAM Left 06/09/2017   Procedure: A/V FISTULAGRAM;  Surgeon: Algernon Huxley, MD;  Location: [place] [facility];  Service: Cardiovascular;  Laterality: Left;   A/V FISTULAGRAM Left 06/16/2017   Procedure: A/V FISTULAGRAM;  Surgeon: Algernon Huxley, MD;  Location: [place] [facility];  Service: Cardiovascular;  Laterality: Left;   A/V FISTULAGRAM Left 01/28/2018   Procedure: A/V FISTULAGRAM;  Surgeon: Algernon Huxley, MD;  Location: [place] [facility];  Service: Cardiovascular;  Laterality: Left;   AMPUTATION TOE Right 10/21/2020   Procedure: AMPUTATION TOE-5th Digit Amputation Right Foot;  Surgeon: Edrick Kins, DPM;  Location: [facility];  Service: Podiatry;  Laterality: Right;   AV FISTULA PLACEMENT Left 04/25/2017   Procedure: ARTERIOVENOUS (AV) FISTULA CREATION;  Surgeon: Algernon Huxley, MD;  Location: [facility];  Service: Vascular;  Laterality: Left;   CATARACT EXTRACTION W/PHACO Left 01/21/2018   Procedure: CATARACT EXTRACTION PHACO AND INTRAOCULAR LENS PLACEMENT (IOC);  Surgeon: Leandrew Koyanagi, MD;  Location: [facility];  Service: Ophthalmology;  Laterality: Left;  [place] 01:55.5 CDE 9.05 EAUP 13.9 Fluid Pack Lot # [id number]   CESAREAN SECTION     times 3   DIALYSIS/PERMA CATHETER INSERTION Right 04/04/2017   Procedure: DIALYSIS/PERMA CATHETER INSERTION;  Surgeon: Katha Cabal, MD;  Location: [place] [facility];  Service: Cardiovascular;  Laterality: Right;   DIALYSIS/PERMA CATHETER REMOVAL N/A 09/17/2017   Procedure: DIALYSIS/PERMA CATHETER REMOVAL;  Surgeon: Algernon Huxley, MD;  Location: [place] [facility];  Service:  Cardiovascular;  Laterality: N/A;   INCISION AND DRAINAGE Right 10/26/2020   Procedure: INCISION AND DRAINAGE;  Surgeon: [name], DPM;  Location: [facility];  Service: Podiatry;  Laterality: Right;   INSERTION OF DIALYSIS CATHETER N/A 04/16/2017   Procedure: INSERTION OF DIALYSIS PERM CATHETER;  Surgeon: Algernon Huxley, MD;  Location: [facility];  Service: Vascular;  Laterality: N/A;   LOWER EXTREMITY ANGIOGRAPHY Right 10/18/2020   Procedure: Lower Extremity Angiography;  Surgeon: Algernon Huxley, MD;  Location: [place] [facility];  Service: Cardiovascular;  Laterality: Right;    Prior to Admission medications   Medication Sig Start Date End Date Taking? Authorizing Provider  acetaminophen (TYLENOL) 500 MG tablet Take 1,000 mg by mouth every 6 (six) hours as needed for mild pain, fever or headache.    [provider]  albuterol (PROVENTIL) (2.5 MG/3ML) 0.083% nebulizer solution Take 3 mLs (2.5 mg total) by nebulization every 6 (six) hours as needed for wheezing or shortness  of breath. 11/10/20   Kathie Dike, MD  albuterol (VENTOLIN HFA) 108 (90 Base) MCG/ACT inhaler Inhale 2 puffs into the lungs every 4 (four) hours as needed for wheezing or shortness of breath. 12/30/19   Tyler Pita, MD  aspirin EC 81 MG tablet Take 81 mg by mouth daily.     [provider]  b complex-vitamin c-folic acid (NEPHRO-VITE) 0.8 MG TABS tablet Take 1 tablet by mouth at bedtime.    [provider]  ceFEPime (MAXIPIME) IVPB Inject 2 g into the vein Every Tuesday,Thursday,and Saturday with dialysis for 11 days. To be given at HD center Indication:  necrotic diabetic foot infection Last Day of Therapy:  11/22/2020 Pre-HD vancomycin level 15-53mcg/ml or as otherwise directed per HD center protocol Labs weekly while on IV antibiotics: CBC w/ differential, CMP Fax weekly labs to  Dr. Donney Dice) [phone] 11/11/20 11/22/20  Kathie Dike, MD  Cholecalciferol 100 MCG (4000 UT) CAPS Take 1  capsule by mouth once a week.    [provider]  clindamycin (CLEOCIN T) 1 % lotion Apply 1 application topically daily. 09/19/20   [provider]  docusate sodium (COLACE) 100 MG capsule Take 100 mg by mouth daily.    [provider]  ELDERBERRY PO Take by mouth daily.    [provider]  ferric citrate (AURYXIA) 1 GM 210 MG(Fe) tablet Take 210 mg by mouth 3 (three) times daily with meals.    [provider]  insulin aspart (NOVOLOG) 100 UNIT/ML injection Inject 0-5 Units into the skin at bedtime. 04/26/17   Epifanio Lesches, MD  insulin aspart (NOVOLOG) 100 UNIT/ML injection Inject 0-15 Units into the skin 3 (three) times daily with meals. 04/26/17   Epifanio Lesches, MD  insulin degludec (TRESIBA) 100 UNIT/ML FlexTouch Pen Inject 43 Units into the skin daily at 10 pm. 11/08/20   Kathie Dike, MD  ipratropium-albuterol (DUONEB) 0.5-2.5 (3) MG/3ML SOLN Take 3 mLs by nebulization every 6 (six) hours as needed. 03/10/17   Salary, Avel Peace, MD  lactulose (CHRONULAC) 10 GM/15ML solution Take 30 mLs (20 g total) by mouth daily as needed for severe constipation. 04/26/17   Epifanio Lesches, MD  levocetirizine (XYZAL) 5 MG tablet Take 5 mg by mouth at bedtime. 05/25/20   [provider]  lidocaine-prilocaine (EMLA) cream Apply topically as needed (For access for dialysis).    [provider]  LINZESS 290 MCG CAPS capsule Take 290 mcg by mouth every morning. 10/10/20   [provider]  metoprolol tartrate (LOPRESSOR) 25 MG tablet Take 1 tablet (25 mg total) by mouth 2 (two) times daily. 11/10/20   Kathie Dike, MD  midodrine (PROAMATINE) 10 MG tablet Take 20 mg by mouth daily as needed. 08/22/20   [provider]  omeprazole (PRILOSEC) 20 MG capsule Take 20 mg by mouth daily. 08/06/20   [provider]  oxyCODONE-acetaminophen (PERCOCET) 5-325 MG tablet Take 1 tablet by mouth every 4 (four) hours as needed for  severe pain. 11/08/20   Kathie Dike, MD  OXYGEN Inhale 2-4 L into the lungs.    [provider]  Donnal Debar 200-62.5-25 MCG/INH AEPB INHALE 1 PUFF BY MOUTH ONCE DAILY. APPOINTMENT NEEDED FOR FURTHER REFILLS. 10/07/20   Tyler Pita, MD  vancomycin IVPB Inject 1,000 mg into the vein Every Tuesday,Thursday,and Saturday with dialysis for 20 days. Indication:  necrotic diabetic foot infection To be given at HD center Last Day of Therapy:  11/22/2020 Pre-HD vancomycin level 15-63mcg/ml or  as otherwise directed per HD center protocol Labs weekly while on IV antibiotics: CBC w/ differential, CMP Fax weekly labs to  Dr>[phone] 11/09/20 11/29/20  Kathie Dike, MD    Allergies Dust mite extract, Mold extract [trichophyton], Pollen extract, Sulfa antibiotics, and Feraheme [ferumoxytol]  Family History  Problem Relation Age of Onset   Diabetes Mother    Hypertension Mother    Hypertension Father    Diabetes Father    Hypertension Other    Diabetes Other    Breast cancer Maternal Aunt 69   Breast cancer Maternal Aunt 50    Social History Social History   Tobacco Use   Smoking status: Never   Smokeless tobacco: Never  Vaping Use   Vaping Use: Never used  Substance Use Topics   Alcohol use: Not Currently    Comment: occasional   Drug use: Yes    Types: Marijuana    Review of Systems  Review of Systems  Constitutional:  Positive for fatigue. Negative for fever.  HENT:  Negative for congestion and sore throat.   Eyes:  Negative for visual disturbance.  Respiratory:  Positive for shortness of breath. Negative for cough.   Cardiovascular:  Negative for chest pain.  Gastrointestinal:  Negative for abdominal pain, diarrhea, nausea and vomiting.  Genitourinary:  Negative for flank pain.  Musculoskeletal:  Negative for back pain and neck pain.  Skin:  Negative for rash and wound.  Neurological:  Positive for weakness and light-headedness.  All  other systems reviewed and are negative.   ____________________________________________  PHYSICAL EXAM:      VITAL SIGNS: ED Triage Vitals  Enc Vitals Group     BP 11/13/20 2327 (!) 159/100     Pulse Rate 11/13/20 2327 84     Resp 11/13/20 2327 (!) 22     Temp 11/13/20 2327 98.6 F (37 C)     Temp Source 11/13/20 2327 Oral     SpO2 11/13/20 2327 100 %     Weight 11/13/20 2326 (!) 323 lb (146.5 kg)     Height 11/13/20 2326 5\' 4"  (1.626 m)     Head Circumference --      Peak Flow --      Pain Score 11/13/20 2326 5     Pain Loc --      Pain Edu? --      Excl. in [place]? --      Physical Exam Vitals and nursing note reviewed.  Constitutional:      General: She is not in acute distress.    Appearance: She is well-developed.  HENT:     Head: Normocephalic and atraumatic.  Eyes:     Conjunctiva/sclera: Conjunctivae normal.  Cardiovascular:     Rate and Rhythm: Normal rate and regular rhythm.     Heart sounds: Normal heart sounds. No murmur heard.   No friction rub.  Pulmonary:     Effort: Pulmonary effort is normal. No respiratory distress.     Breath sounds: Rales (Bibasilar, markedly diminished on the right) present. No wheezing.  Abdominal:     General: There is no distension.     Palpations: Abdomen is soft.     Tenderness: There is no abdominal tenderness.  Musculoskeletal:     Cervical back: Neck supple.  Skin:    General: Skin is warm.     Capillary Refill: Capillary refill takes less than 2 seconds.  Neurological:     Mental Status: She is alert and oriented to person,  place, and time.     Motor: No abnormal muscle tone.      ____________________________________________   LABS (all labs ordered are listed, but only abnormal results are displayed)  Labs Reviewed  BASIC METABOLIC PANEL - Abnormal; Notable for the following components:      Result Value   Sodium 131 (*)    Chloride 92 (*)    Glucose, Bld 128 (*)    BUN 57 (*)    Creatinine, Ser 7.90 (*)     Calcium 7.4 (*)    GFR, Estimated 6 (*)    All other components within normal limits  CBC - Abnormal; Notable for the following components:   WBC 11.4 (*)    RBC 2.75 (*)    Hemoglobin 8.3 (*)    HCT 26.8 (*)    RDW 15.6 (*)    Platelets 405 (*)    All other components within normal limits  TROPONIN I (HIGH SENSITIVITY) - Abnormal; Notable for the following components:   Troponin I (High Sensitivity) 22 (*)    All other components within normal limits  TROPONIN I (HIGH SENSITIVITY) - Abnormal; Notable for the following components:   Troponin I (High Sensitivity) 21 (*)    All other components within normal limits  RESP PANEL BY RT-PCR (FLU A&B, COVID) ARPGX2  HEPATITIS B SURFACE ANTIGEN  HEPATITIS B SURFACE ANTIBODY,QUALITATIVE  HEPATITIS B SURFACE ANTIBODY, QUANTITATIVE  POC URINE PREG, ED    ____________________________________________  EKG: Normal sinus rhythm, ventricular 84.  PR 188, QRS 82, QTc 467.  No acute ST elevations or depressions.  No acute evidence of acute ischemia or infarct. ________________________________________  RADIOLOGY All imaging, including plain films, CT scans, and ultrasounds, independently reviewed by me, and interpretations confirmed via formal radiology reads.  ED MD interpretation:   Chest x-ray: Significantly worsened right-sided pleural effusion and pulmonary edema  Official radiology report(s): DG Chest 2 View  Result Date: 11/14/2020 CLINICAL DATA:  Shortness of breath. EXAM: CHEST - 2 VIEW COMPARISON:  11/09/2020 FINDINGS: Stable cardiomediastinal contours. There has been significant interval increase in volume of right pleural effusion. Similar pulmonary vascular congestion. Mild atelectasis noted in the left lower lobe. IMPRESSION: 1. Significant increase in volume of right pleural effusion. 2. Pulmonary vascular congestion. Electronically Signed   By: Kerby Moors M.D.   On: 11/14/2020 09:06     ____________________________________________  PROCEDURES   Procedure(s) performed (including Critical Care):  Procedures  ____________________________________________  INITIAL IMPRESSION / MDM / [place] / ED COURSE  As part of my medical decision making, I reviewed the following data within the [facility] notes reviewed and incorporated, Old chart reviewed, Notes from prior ED visits, and [place] Controlled Substance Database       *KATRICE GOEL was evaluated in Emergency Department on 11/14/2020 for the symptoms described in the history of present illness. She was evaluated in the context of the global COVID-19 pandemic, which necessitated consideration that the patient might be at risk for infection with the SARS-CoV-2 virus that causes COVID-19. Institutional protocols and algorithms that pertain to the evaluation of patients at risk for COVID-19 are in a state of rapid change based on information released by regulatory bodies including the CDC and federal and state organizations. These policies and algorithms were followed during the patient's care in the ED.  Some ED evaluations and interventions may be delayed as a result of limited staffing during the pandemic.*     Medical Decision  Making: 50 year old female with history of end-stage renal disease here with acute on chronic respiratory distress, hypoxia, and mild lightheadedness.  Suspect acute on chronic fluid overload, possibly in the setting of recent hospitalization.  From an infectious standpoint, she has a normal white count and seems to be recovering well from her osteomyelitis.  She does have increased work of breathing and markedly diminished breath sounds, particularly on the right, with chest x-ray concerning for significant increase in pleural effusion.  She reports that she has gained over 20 pounds.  Will plan to admit for diuresis and close monitoring of fluid status, as given her  cardiac history I suspect this could be contributing to her transient hypotension and shortness of breath.  Otherwise, no signs of ischemia on EKG.  No focal neurological deficits.  Patient is otherwise hemodynamically stable.  No significant arrhythmia noted in the ED.  Dr. Candiss Norse consulted and patient will be taken to dialysis while awaiting hospitalist admission.  ____________________________________________  FINAL CLINICAL IMPRESSION(S) / ED DIAGNOSES  Final diagnoses:  Pleural effusion  Acute pulmonary edema (HCC)  ESRD (end stage renal disease) (HCC)  Lightheaded     MEDICATIONS GIVEN DURING THIS VISIT:  Medications  Chlorhexidine Gluconate Cloth 2 % PADS 6 each (has no administration in time range)  pentafluoroprop-tetrafluoroeth (GEBAUERS) aerosol 1 application (has no administration in time range)  lidocaine (PF) (XYLOCAINE) 1 % injection 5 mL (has no administration in time range)  lidocaine-prilocaine (EMLA) cream 1 application (has no administration in time range)  0.9 %  sodium chloride infusion (has no administration in time range)  0.9 %  sodium chloride infusion (has no administration in time range)  heparin injection 1,000 Units (has no administration in time range)  alteplase (CATHFLO ACTIVASE) injection 2 mg (has no administration in time range)  acetaminophen (TYLENOL) tablet 650 mg (650 mg Oral Given 11/14/20 0944)  ceFEPIme (MAXIPIME) 1 g in sodium chloride 0.9 % 100 mL IVPB (has no administration in time range)     ED Discharge Orders     None        Note:  This document was prepared using Dragon voice recognition software and may include unintentional dictation errors.   Duffy Bruce, MD 11/14/20 1007

## 2020-11-14 NOTE — Progress Notes (Signed)
Pt tolerated tx well with no complaints and a completed net uf goal of 5.0

## 2020-11-14 NOTE — Progress Notes (Addendum)
[facility] consult received to obtain IV access and draw blood. Pt with orders for labs in am (5/21) and medications to be completed with dialysis (Tu/Th/Sa). Pt received dialysis today and has returned to ER after treatment. SecureChat sent to ER RN, Rodman Key to inquire about need for IV access at this time. Best practice is to place IV access when needed to allow for vessel preservation and decrease risk of infection. Spoke with Agbata, MD via SecureChat regarding best practice to place IV access when needed (especially in renal patients). He still wants PIV placed at this time. VAST RN will attempt IV access as able.

## 2020-11-14 NOTE — ED Notes (Signed)
To x-ray

## 2020-11-14 NOTE — ED Notes (Addendum)
First pt contact at this time. Wearing 6lpm [place]. Pt NAD, a/ox4. Pt states BIBA last night after she began to feel very light headed like she was going to pass out, ands/or her BGL was low. Pt states she has had this happen before because she is a HD pt. Pt states she has just returned from HD. Pt denies current dizziness, CP, SOB, nausea. VSS.

## 2020-11-14 NOTE — Progress Notes (Signed)
[facility]  ROUNDING NOTE   Subjective:   Patient seen and evaluated during dialysis   HEMODIALYSIS FLOWSHEET:  Blood Flow Rate (mL/min): 350 mL/min Arterial Pressure (mmHg): -150 mmHg Venous Pressure (mmHg): 210 mmHg Transmembrane Pressure (mmHg): 80 mmHg Ultrafiltration Rate (mL/min): 1800 mL/min Dialysate Flow Rate (mL/min): 500 ml/min Conductivity: Machine : 13.8 Conductivity: Machine : 13.8 Dialysis Fluid Bolus: Normal Saline Bolus Amount (mL): 250 mL  Feels she has a lot of fluid in her abdomen States she has not eaten much, but feels like there was not enough taken off during her previous admission.  Objective:  Vital signs in last 24 hours:  Temp:  [98.6 F (37 C)-98.7 F (37.1 C)] 98.6 F (37 C) (09/20 0923) Pulse Rate:  [70-84] 81 (09/20 1115) Resp:  [18-25] 18 (09/20 1115) BP: (130-159)/(68-119) 145/101 (09/20 1115) SpO2:  [95 %-100 %] 100 % (09/20 1142) Weight:  [146.5 kg] 146.5 kg (09/19 2326)  Weight change:  Filed Weights   11/13/20 2326  Weight: (!) 146.5 kg    Intake/Output: No intake/output data recorded.   Intake/Output this shift:  No intake/output data recorded.  Physical Exam: General: NAD, sitting up in bed  Head: Normocephalic, atraumatic. Moist oral mucosal membranes  Eyes: Anicteric  Lungs:  Clear to auscultation, O2 2L  Heart: Regular rate and rhythm  Abdomen:  Soft, nontender  Extremities: trace peripheral edema. Right lower extremity in ace dressings.   Neurologic: Alert, moving all four extremities  Skin: No lesions  Access: Left AVF    Basic Metabolic Panel: Recent Labs  Lab 11/08/20 1445 11/09/20 0412 11/10/20 0538 11/13/20 2330  NA 120* 121* 127* 131*  K 4.2 4.3 4.2 4.5  CL 84* 86* 91* 92*  CO2 23 23 28 25   GLUCOSE 193* 217* 78 128*  BUN 52* 59* 43* 57*  CREATININE 7.26* 8.00* 6.11* 7.90*  CALCIUM 6.9* 6.9* 7.3* 7.4*  PHOS  --  5.0*  --   --      Liver Function Tests: Recent Labs  Lab  11/09/20 0412  ALBUMIN 2.7*    No results for input(s): LIPASE, AMYLASE in the last 168 hours. No results for input(s): AMMONIA in the last 168 hours.  CBC: Recent Labs  Lab 11/08/20 1445 11/13/20 2330  WBC 10.9* 11.4*  HGB 8.0* 8.3*  HCT 24.6* 26.8*  MCV 95.3 97.5  PLT 304 405*     Cardiac Enzymes: No results for input(s): CKTOTAL, CKMB, CKMBINDEX, TROPONINI in the last 168 hours.  BNP: Invalid input(s): POCBNP  CBG: Recent Labs  Lab 11/09/20 2153 11/10/20 0839 11/10/20 1214 11/10/20 1633 11/14/20 1044  GLUCAP 175* 133* 91 207* 85     Microbiology: Results for orders placed or performed during the hospital encounter of 10/15/20  Blood Culture (routine x 2)     Status: None   Collection Time: 10/15/20  8:03 PM   Specimen: BLOOD  Result Value Ref Range Status   Specimen Description BLOOD BLOOD RIGHT FOREARM  Final   Special Requests   Final    BOTTLES DRAWN AEROBIC AND ANAEROBIC Blood Culture adequate volume   Culture   Final    NO GROWTH 5 DAYS Performed at [facility], [address], [place], [place] [address]    Report Status 10/20/2020 FINAL  Final  Resp Panel by RT-PCR (Flu A&B, Covid) Nasopharyngeal Swab     Status: None   Collection Time: 10/15/20  8:26 PM   Specimen: Nasopharyngeal Swab; Nasopharyngeal(NP) swabs in vial  transport medium  Result Value Ref Range Status   SARS Coronavirus 2 by RT PCR NEGATIVE NEGATIVE Final    Comment: (NOTE) SARS-CoV-2 target nucleic acids are NOT DETECTED.  The SARS-CoV-2 RNA is generally detectable in upper respiratory specimens during the acute phase of infection. The lowest concentration of SARS-CoV-2 viral copies this assay can detect is 138 copies/mL. A negative result does not preclude SARS-Cov-2 infection and should not be used as the sole basis for treatment or other patient management decisions. A negative result may occur with  improper specimen collection/handling, submission of specimen  other than nasopharyngeal swab, presence of viral mutation(s) within the areas targeted by this assay, and inadequate number of viral copies(<138 copies/mL). A negative result must be combined with clinical observations, patient history, and epidemiological information. The expected result is Negative.  Fact Sheet for Patients:  EntrepreneurPulse.com.au  Fact Sheet for Healthcare Providers:  IncredibleEmployment.be  This test is no t yet approved or cleared by the [place] FDA and  has been authorized for detection and/or diagnosis of SARS-CoV-2 by FDA under an Emergency Use Authorization (EUA). This EUA will remain  in effect (meaning this test can be used) for the duration of the COVID-19 declaration under Section 564(b)(1) of the Act, 21 U.S.C.section 360bbb-3(b)(1), unless the authorization is terminated  or revoked sooner.       Influenza A by PCR NEGATIVE NEGATIVE Final   Influenza B by PCR NEGATIVE NEGATIVE Final    Comment: (NOTE) The Xpert Xpress SARS-CoV-2/FLU/RSV plus assay is intended as an aid in the diagnosis of influenza from Nasopharyngeal swab specimens and should not be used as a sole basis for treatment. Nasal washings and aspirates are unacceptable for Xpert Xpress SARS-CoV-2/FLU/RSV testing.  Fact Sheet for Patients: EntrepreneurPulse.com.au  Fact Sheet for Healthcare Providers: IncredibleEmployment.be  This test is not yet approved or cleared by the [place] FDA and has been authorized for detection and/or diagnosis of SARS-CoV-2 by FDA under an Emergency Use Authorization (EUA). This EUA will remain in effect (meaning this test can be used) for the duration of the COVID-19 declaration under Section 564(b)(1) of the Act, 21 U.S.C. section 360bbb-3(b)(1), unless the authorization is terminated or revoked.  Performed at [facility], [address]  [place], [place] [address]   Blood Culture (routine x 2)     Status: None   Collection Time: 10/15/20  8:26 PM   Specimen: BLOOD  Result Value Ref Range Status   Specimen Description BLOOD RIGHT ANTECUBITAL  Final   Special Requests   Final    BOTTLES DRAWN AEROBIC AND ANAEROBIC Blood Culture adequate volume   Culture   Final    NO GROWTH 5 DAYS Performed at [facility], [address] [place], [place] [address]    Report Status 10/20/2020 FINAL  Final  Aerobic Culture w Gram Stain (superficial specimen)     Status: None   Collection Time: 10/17/20  8:52 PM   Specimen: Wound  Result Value Ref Range Status   Specimen Description WOUND  Final   Special Requests NONE  Final   Gram Stain   Final    RARE SQUAMOUS EPITHELIAL CELLS PRESENT FEW WBC PRESENT, PREDOMINANTLY MONONUCLEAR ABUNDANT GRAM VARIABLE ROD FEW GRAM POSITIVE COCCI    Culture   Final    MODERATE ENTEROCOCCUS FAECALIS MODERATE KLEBSIELLA OXYTOCA ABUNDANT DIPHTHEROIDS(CORYNEBACTERIUM SPECIES) Standardized susceptibility testing for this organism is not available.    Report Status 10/20/2020 FINAL  Final   Organism ID,  Bacteria ENTEROCOCCUS FAECALIS  Final   Organism ID, Bacteria KLEBSIELLA OXYTOCA  Final      Susceptibility   Enterococcus faecalis - MIC*    AMPICILLIN <=2 SENSITIVE Sensitive     VANCOMYCIN 1 SENSITIVE Sensitive     GENTAMICIN SYNERGY RESISTANT Resistant     * MODERATE ENTEROCOCCUS FAECALIS   Klebsiella oxytoca - MIC*    AMPICILLIN >=32 RESISTANT Resistant     CEFAZOLIN <=4 SENSITIVE Sensitive     CEFEPIME <=0.12 SENSITIVE Sensitive     CEFTAZIDIME <=1 SENSITIVE Sensitive     CEFTRIAXONE <=0.25 SENSITIVE Sensitive     CIPROFLOXACIN <=0.25 SENSITIVE Sensitive     GENTAMICIN <=1 SENSITIVE Sensitive     IMIPENEM <=0.25 SENSITIVE Sensitive     TRIMETH/SULFA <=20 SENSITIVE Sensitive     AMPICILLIN/SULBACTAM 8 SENSITIVE Sensitive     PIP/TAZO <=4 SENSITIVE Sensitive     * MODERATE  KLEBSIELLA OXYTOCA  Aerobic/Anaerobic Culture w Gram Stain (surgical/deep wound)     Status: None   Collection Time: 10/21/20  3:07 PM   Specimen: PATH Digit amputation; Tissue  Result Value Ref Range Status   Specimen Description   Final    FOOT  RIGHT 5TH DIGIT AMPUTATION Performed at [facility], [address], [place], [place] [address]    Special Requests   Final    NONE Performed at [facility], [place], [place] [address]    Gram Stain   Final    FEW SQUAMOUS EPITHELIAL CELLS PRESENT FEW WBC SEEN FEW GRAM POSITIVE COCCI    Culture   Final    FEW STREPTOCOCCUS ANGINOSIS NO ANAEROBES ISOLATED Performed at [facility], [place] [address], [place], [place] [address]    Report Status 10/27/2020 FINAL  Final   Organism ID, Bacteria STREPTOCOCCUS ANGINOSIS  Final      Susceptibility   Streptococcus anginosis - MIC*    PENICILLIN 0.12 SENSITIVE Sensitive     CEFTRIAXONE 0.5 SENSITIVE Sensitive     ERYTHROMYCIN 4 RESISTANT Resistant     LEVOFLOXACIN 0.5 SENSITIVE Sensitive     VANCOMYCIN 0.5 SENSITIVE Sensitive     * FEW STREPTOCOCCUS ANGINOSIS  Aerobic/Anaerobic Culture w Gram Stain (surgical/deep wound)     Status: None   Collection Time: 10/26/20  5:35 PM   Specimen: PATH Other; Tissue  Result Value Ref Range Status   Specimen Description   Final    FOOT RIGHT Performed at [facility], [address], [place], [place] [address]    Special Requests   Final    NONE Performed at [facility], [address] [place], [place] [address]    Gram Stain NO WBC SEEN NO ORGANISMS SEEN   Final   Culture   Final    RARE STREPTOCOCCUS ANGINOSIS NO ANAEROBES ISOLATED Performed at [facility], [place] [address], [place], [place] [address]    Report Status 10/31/2020 FINAL  Final   Organism ID, Bacteria STREPTOCOCCUS ANGINOSIS  Final      Susceptibility   Streptococcus anginosis - MIC*    PENICILLIN 0.12  SENSITIVE Sensitive     CEFTRIAXONE 0.5 SENSITIVE Sensitive     ERYTHROMYCIN >=8 RESISTANT Resistant     LEVOFLOXACIN 0.5 SENSITIVE Sensitive     VANCOMYCIN 0.5 SENSITIVE Sensitive     * RARE STREPTOCOCCUS ANGINOSIS    Coagulation Studies: No results for input(s): LABPROT, INR in the last 72 hours.   Urinalysis: No results for input(s):  COLORURINE, LABSPEC, [place], GLUCOSEU, HGBUR, BILIRUBINUR, KETONESUR, PROTEINUR, UROBILINOGEN, NITRITE, LEUKOCYTESUR in the last 72 hours.  Invalid input(s): APPERANCEUR    Imaging: DG Chest 2 View  Result Date: 11/14/2020 CLINICAL DATA:  Shortness of breath. EXAM: CHEST - 2 VIEW COMPARISON:  11/09/2020 FINDINGS: Stable cardiomediastinal contours. There has been significant interval increase in volume of right pleural effusion. Similar pulmonary vascular congestion. Mild atelectasis noted in the left lower lobe. IMPRESSION: 1. Significant increase in volume of right pleural effusion. 2. Pulmonary vascular congestion. Electronically Signed   By: Kerby Moors M.D.   On: 11/14/2020 09:06     Medications:    sodium chloride     sodium chloride     sodium chloride     ceFEPime (MAXIPIME) IV 1 g (11/14/20 1129)   ceFEPime     vancomycin      aspirin EC  81 mg Oral Daily   Chlorhexidine Gluconate Cloth  6 each Topical Q0600   Cholecalciferol  1 capsule Oral Weekly   docusate sodium  100 mg Oral Daily   ferric citrate  210 mg Oral TID WC   Fluticasone-Umeclidin-Vilant   Inhalation Daily   heparin  5,000 Units Subcutaneous Q8H   insulin aspart  0-6 Units Subcutaneous TID WC   insulin degludec  43 Units Subcutaneous Q2200   ipratropium-albuterol  3 mL Nebulization TID   levocetirizine  5 mg Oral QHS   linaclotide  290 mcg Oral q morning   metoprolol tartrate  25 mg Oral BID   multivitamin  1 tablet Oral QHS   pantoprazole  40 mg Oral Daily   sodium chloride flush  3 mL Intravenous Q12H   sodium chloride, sodium chloride, sodium chloride,  acetaminophen, acetaminophen, albuterol, alteplase, heparin, lidocaine (PF), lidocaine-prilocaine, lidocaine-prilocaine, midodrine, ondansetron **OR** ondansetron (ZOFRAN) IV, oxyCODONE-acetaminophen, pentafluoroprop-tetrafluoroeth, sodium chloride flush  Assessment/ Plan:  [name] is a 50 y.o. black female with end stage renal disease on hemodialysis, diabetes mellitus type II, hypertension, congestive heart failure, COPD, who is admitted to [facility] on 11/14/2020 for Acute on chronic diastolic CHF (congestive heart failure) ([place]) [I50.33]  [facility] T[facility] Left AVF 127.5kg  Fluid volume overload with end stage renal disease on dialysis:   - Receiving dialysis now due to scheduled dialysis day - Attempting UF 5L as tolerated  Hypertension: 155/103. Currently not on any blood pressure agents.   Hyponatremia: secondary to ESRD.  Na 131 - Should correct with dialysis today  Diabetes mellitus type II with chronic kidney disease: complicated by diabetic foot ulcer and osteomyelitis. Not well controlled. Hemoglobin A1c 8.9% on 10/15/20.   Osteomyelitis: with step anginosis - outpatient antibiotics: cefepime 2g with HD treatments. End date 9/29  Anemia of chronic kidney disease: hemoglobin 8.3, normocytic.  - EPO 10000 units on HD treatments.   Secondary Hyperparathyroidism: with hypocalcemia. Discontinued cinacalcet.   - Calcium not at target    LOS: 0 Tabbetha Kutscher 9/20/202211:45 AM

## 2020-11-14 NOTE — Progress Notes (Signed)
Waiting on maxipime from pharmacy, pharmacy asked three times to send to dialysis.

## 2020-11-14 NOTE — ED Notes (Signed)
Pt given ice chips per request

## 2020-11-14 NOTE — Progress Notes (Signed)
Per doctor UF  net goal increased from 3.5 to 5.0 RN aware

## 2020-11-14 NOTE — ED Notes (Signed)
This tech assisted pt w/ bedpad. Pericare provided.

## 2020-11-14 NOTE — Consult Note (Signed)
Pharmacy Antibiotic Note  [name] is a 50 y.o. female admitted on 11/14/2020 with necrotic foot infection on outpatient IV cefepime and Vancomycin until 11/22/20 following recent admission 8/21-9/16/22. Patient w/ ESRD on HF T-Th-Sat. Pharmacy has been consulted for Vancomycin dosing.  Patient with difficulty IV access, antibiotics given in HD previous admission. IV team consulted for peripheral line   Last HD 11/14/20  Plan: Continue outpatient cefepime 2 gram Q HD (T-Th-Sat) Patient received 1 gram x 1, will order additional 1 gram to keep on schedule Patient on Vancomycin 1000 mg q HD T-Th, Sat, with weekly levels Last pre-HD level 11/07/20 = 23  Will order post-HD level for now and assess for re-dose  Goal pre-HD level 15-25 cg/mL Plan to continue Antibiotic therapy until 11/22/20 per outpatient regimen/ ID following   Height: 5\' 4"  (162.6 cm) Weight: (!) 146.5 kg (323 lb) IBW/kg (Calculated) : 54.7  Temp (24hrs), Avg:98.6 F (37 C), Min:98.6 F (37 C), Max:98.7 F (37.1 C)  Recent Labs  Lab 11/08/20 1445 11/09/20 0412 11/10/20 0538 11/13/20 2330  WBC 10.9*  --   --  11.4*  CREATININE 7.26* 8.00* 6.11* 7.90*    Estimated Creatinine Clearance: 12.4 mL/min (A) (by C-G formula based on SCr of 7.9 mg/dL (H)).    Allergies  Allergen Reactions   Dust Mite Extract Shortness Of Breath and Itching   Mold Extract [Trichophyton] Shortness Of Breath and Itching   Pollen Extract Shortness Of Breath    Itching, shortness of breath, asthma like symptoms   Sulfa Antibiotics Itching   Feraheme [Ferumoxytol] Itching    Uses benadryl so she can get the medicine    Antimicrobials this admission: 9/20 cefepime >>  9/20 vancomycin >>   Dose adjustments this admission: N/a  Microbiology results: 9/1 aerobic/anaerobic foot Cx:  RARE STREPTOCOCCUS ANGINOSIS  8/23 Wound Cx: ENTEROCOCCUS FAECALIS, KLEBSIELLA OXYTOCA    Thank you for allowing pharmacy to be a part of this  patient's care.  Dorothe Pea, PharmD, BCPS Clinical Pharmacist   11/14/2020 5:57 PM

## 2020-11-14 NOTE — Progress Notes (Signed)
Spoke with Katrina in central telemetry at 0909 to transfer patient from ED 6 to [facility] 1.

## 2020-11-14 NOTE — H&P (Signed)
History and Physical    [name] UXN:[id number] DOB: Jul 24, 1970 DOA: 11/14/2020  PCP: [name], NP   Patient coming from: Home  I have personally briefly reviewed patient's old medical records in [place]  Chief Complaint: Shortness of breath  HPI: [name] is a 50 y.o. female with medical history significant for diabetes mellitus with complications of ESRD on dialysis (T/TH/S), chronic diastolic dysfunction CHF, hypertension, morbid obesity, obesity hypoventilation syndrome on oxygen supplementation 3 to 4 L, peripheral vascular disease status post amputation of the fifth right digit as well as I&D of abscess on the right foot, depression and anxiety who was recently discharged from the hospital on 11/10/20 and presents today for evaluation of dizziness and shortness of breath. Patient states that she was in her usual state of health following her discharge and had gone to dialysis 2 days prior to this hospitalization where she was found to be about 20lbs above her dry weight.  They were able to pull 6L of fluid during her dialysis. Over the last 24 hours she states that she has felt very dizzy and lightheaded and has had worsening shortness of breath from her baseline.  She checked her blood pressure at home and had a systolic blood pressure in the 90s so she took 1 midodrine. She denies having any dietary indiscretion. She denies having any fever, no chills, no chest pain, no nausea, no vomiting, no palpitations, no diaphoresis, no cough, no blurred vision, no focal deficits, no changes in her bowel habits, no urinary symptoms. Labs show sodium 131, potassium 4.5, chloride 92, bicarb 25, glucose 128, BUN 57, creatinine 7.90, calcium 7.4, troponin 22 >> 21, white count 11.4, hemoglobin 8.3, hematocrit 26.8, MCV 97.5, RDW 15.6, platelet count 405, Chest x-ray reviewed by me shows pulmonary vascular congestion.  Significant increase in volume of right pleural  effusion. Twelve-lead EKG reviewed by me shows normal sinus rhythm with low voltage QRS.      ED Course: Patient is a 50 year old morbidly obese African-American female who presents to the ER for evaluation of worsening shortness of breath from her baseline with an increased oxygen requirement from 3 to 4 L to now 6 L. Patient has had a 20 pound weight gain over her dry weight and imaging shows pulmonary vascular congestion as well as increase in volume of right pleural effusion. She will be referred to observation status for further evaluation.     Review of Systems: As per HPI otherwise all other systems reviewed and negative.    Past Medical History:  Diagnosis Date   (HFpEF) heart failure with preserved ejection fraction ([place])    a. 11/2014 Echo: EF 50-55%, Gr1 DD; b. 06/2016 Echo: EF 60-65%, no rwma, mod dil LA, nl RV, triv eff.   Anxiety    Asthma    Bronchitis    CHF (congestive heart failure) (HCC)    Chronic cough    Chronic respiratory failure with hypoxia and hypercapnia ([place])    a. Home O2.  (2-4L)   CKD (chronic kidney disease), stage III (HCC)    Depression    Diabetes mellitus without complication (HCC)    Diverticulosis    Dyspnea    Dysrhythmia    per pt, no arrythmias   Environmental and seasonal allergies    Extreme obesity with alveolar hypoventilation (HCC)    Hypertension    Iron deficiency anemia    a. chronic blood loss->heavy menses.   Kidney failure  dialysis tues/thur/sat   ... left arm shunt   Orthopnea    Oxygen dependent    3l/min   Pericardial effusion    a. 11/2014 CT Chest: Mod effusion; b. 11/2014 Echo: small to mod circumferential effusion. No hemodynamic compromise.   Pneumonia    history   Poorly controlled diabetes mellitus ([place])    a. 11/2014 A1c 13.2.   Swelling of both lower extremities     Past Surgical History:  Procedure Laterality Date   A/V FISTULAGRAM Left 06/09/2017   Procedure: A/V FISTULAGRAM;  Surgeon: Algernon Huxley, MD;  Location: [facility];  Service: Cardiovascular;  Laterality: Left;   A/V FISTULAGRAM Left 06/16/2017   Procedure: A/V FISTULAGRAM;  Surgeon: Algernon Huxley, MD;  Location: [facility];  Service: Cardiovascular;  Laterality: Left;   A/V FISTULAGRAM Left 01/28/2018   Procedure: A/V FISTULAGRAM;  Surgeon: Algernon Huxley, MD;  Location: [facility];  Service: Cardiovascular;  Laterality: Left;   AMPUTATION TOE Right 10/21/2020   Procedure: AMPUTATION TOE-5th Digit Amputation Right Foot;  Surgeon: Edrick Kins, DPM;  Location: [facility];  Service: Podiatry;  Laterality: Right;   AV FISTULA PLACEMENT Left 04/25/2017   Procedure: ARTERIOVENOUS (AV) FISTULA CREATION;  Surgeon: Algernon Huxley, MD;  Location: [facility];  Service: Vascular;  Laterality: Left;   CATARACT EXTRACTION W/PHACO Left 01/21/2018   Procedure: CATARACT EXTRACTION PHACO AND INTRAOCULAR LENS PLACEMENT (IOC);  Surgeon: Leandrew Koyanagi, MD;  Location: [facility];  Service: Ophthalmology;  Laterality: Left;  [place] 01:55.5 CDE 9.05 EAUP 13.9 Fluid Pack Lot # [id number]   CESAREAN SECTION     times 3   DIALYSIS/PERMA CATHETER INSERTION Right 04/04/2017   Procedure: DIALYSIS/PERMA CATHETER INSERTION;  Surgeon: Katha Cabal, MD;  Location: [facility];  Service: Cardiovascular;  Laterality: Right;   DIALYSIS/PERMA CATHETER REMOVAL N/A 09/17/2017   Procedure: DIALYSIS/PERMA CATHETER REMOVAL;  Surgeon: Algernon Huxley, MD;  Location: [facility];  Service: Cardiovascular;  Laterality: N/A;   INCISION AND DRAINAGE Right 10/26/2020   Procedure: INCISION AND DRAINAGE;  Surgeon: [name], DPM;  Location: [facility];  Service: Podiatry;  Laterality: Right;   INSERTION OF DIALYSIS CATHETER N/A 04/16/2017   Procedure: INSERTION OF DIALYSIS PERM CATHETER;  Surgeon: Algernon Huxley, MD;  Location: [facility];  Service: Vascular;  Laterality: N/A;   LOWER EXTREMITY ANGIOGRAPHY Right 10/18/2020   Procedure:  Lower Extremity Angiography;  Surgeon: Algernon Huxley, MD;  Location: [facility];  Service: Cardiovascular;  Laterality: Right;     reports that she has never smoked. She has never used smokeless tobacco. She reports that she does not currently use alcohol. She reports current drug use. Drug: Marijuana.  Allergies  Allergen Reactions   Dust Mite Extract Shortness Of Breath and Itching   Mold Extract [Trichophyton] Shortness Of Breath and Itching   Pollen Extract Shortness Of Breath    Itching, shortness of breath, asthma like symptoms   Sulfa Antibiotics Itching   Feraheme [Ferumoxytol] Itching    Uses benadryl so she can get the medicine    Family History  Problem Relation Age of Onset   Diabetes Mother    Hypertension Mother    Hypertension Father    Diabetes Father    Hypertension Other    Diabetes Other    Breast cancer Maternal Aunt 69   Breast cancer Maternal Aunt 50      Prior to Admission  medications   Medication Sig Start Date End Date Taking? Authorizing Provider  acetaminophen (TYLENOL) 500 MG tablet Take 1,000 mg by mouth every 6 (six) hours as needed for mild pain, fever or headache.    [provider]  albuterol (PROVENTIL) (2.5 MG/3ML) 0.083% nebulizer solution Take 3 mLs (2.5 mg total) by nebulization every 6 (six) hours as needed for wheezing or shortness of breath. 11/10/20   Kathie Dike, MD  albuterol (VENTOLIN HFA) 108 (90 Base) MCG/ACT inhaler Inhale 2 puffs into the lungs every 4 (four) hours as needed for wheezing or shortness of breath. 12/30/19   Tyler Pita, MD  aspirin EC 81 MG tablet Take 81 mg by mouth daily.     [provider]  b complex-vitamin c-folic acid (NEPHRO-VITE) 0.8 MG TABS tablet Take 1 tablet by mouth at bedtime.    [provider]  ceFEPime (MAXIPIME) IVPB Inject 2 g into the vein Every Tuesday,Thursday,and Saturday with dialysis for 11 days. To be given at HD center Indication:  necrotic  diabetic foot infection Last Day of Therapy:  11/22/2020 Pre-HD vancomycin level 15-29mcg/ml or as otherwise directed per HD center protocol Labs weekly while on IV antibiotics: CBC w/ differential, CMP Fax weekly labs to  Dr. Donney Dice) [phone] 11/11/20 11/22/20  Kathie Dike, MD  Cholecalciferol 100 MCG (4000 UT) CAPS Take 1 capsule by mouth once a week.    [provider]  clindamycin (CLEOCIN T) 1 % lotion Apply 1 application topically daily. 09/19/20   [provider]  docusate sodium (COLACE) 100 MG capsule Take 100 mg by mouth daily.    [provider]  ELDERBERRY PO Take by mouth daily.    [provider]  ferric citrate (AURYXIA) 1 GM 210 MG(Fe) tablet Take 210 mg by mouth 3 (three) times daily with meals.    [provider]  insulin aspart (NOVOLOG) 100 UNIT/ML injection Inject 0-5 Units into the skin at bedtime. 04/26/17   Epifanio Lesches, MD  insulin aspart (NOVOLOG) 100 UNIT/ML injection Inject 0-15 Units into the skin 3 (three) times daily with meals. 04/26/17   Epifanio Lesches, MD  insulin degludec (TRESIBA) 100 UNIT/ML FlexTouch Pen Inject 43 Units into the skin daily at 10 pm. 11/08/20   Kathie Dike, MD  ipratropium-albuterol (DUONEB) 0.5-2.5 (3) MG/3ML SOLN Take 3 mLs by nebulization every 6 (six) hours as needed. 03/10/17   Salary, Avel Peace, MD  lactulose (CHRONULAC) 10 GM/15ML solution Take 30 mLs (20 g total) by mouth daily as needed for severe constipation. 04/26/17   Epifanio Lesches, MD  levocetirizine (XYZAL) 5 MG tablet Take 5 mg by mouth at bedtime. 05/25/20   [provider]  lidocaine-prilocaine (EMLA) cream Apply topically as needed (For access for dialysis).    [provider]  LINZESS 290 MCG CAPS capsule Take 290 mcg by mouth every morning. 10/10/20   [provider]  metoprolol tartrate (LOPRESSOR) 25 MG tablet Take 1 tablet (25 mg total) by mouth 2 (two) times daily. 11/10/20    Kathie Dike, MD  midodrine (PROAMATINE) 10 MG tablet Take 20 mg by mouth daily as needed. 08/22/20   [provider]  omeprazole (PRILOSEC) 20 MG capsule Take 20 mg by mouth daily. 08/06/20   [provider]  oxyCODONE-acetaminophen (PERCOCET) 5-325 MG tablet Take 1 tablet by mouth every 4 (four) hours as needed for severe pain. 11/08/20   Kathie Dike, MD  OXYGEN Inhale 2-4 L into the lungs.    [provider]  TRELEGY ELLIPTA 200-62.5-25 MCG/INH AEPB INHALE 1 PUFF BY MOUTH ONCE DAILY. APPOINTMENT NEEDED FOR FURTHER REFILLS. 10/07/20   Tyler Pita, MD  vancomycin IVPB Inject 1,000 mg into the vein Every Tuesday,Thursday,and Saturday with dialysis for 20 days. Indication:  necrotic diabetic foot infection To be given at HD center Last Day of Therapy:  11/22/2020 Pre-HD vancomycin level 15-62mcg/ml or as otherwise directed per HD center protocol Labs weekly while on IV antibiotics: CBC w/ differential, CMP Fax weekly labs to  Dr>[phone] 11/09/20 11/29/20  Kathie Dike, MD    Physical Exam: Vitals:   11/13/20 2327 11/13/20 2329 11/14/20 0448 11/14/20 0834  BP: (!) 159/100  130/82 130/78  Pulse: 84 84 74 70  Resp: (!) 22  (!) 25 20  Temp: 98.6 F (37 C)  98.7 F (37.1 C)   TempSrc: Oral  Oral   SpO2: 100% 100% 95% 95%  Weight:      Height:         Vitals:   11/13/20 2327 11/13/20 2329 11/14/20 0448 11/14/20 0834  BP: (!) 159/100  130/82 130/78  Pulse: 84 84 74 70  Resp: (!) 22  (!) 25 20  Temp: 98.6 F (37 C)  98.7 F (37.1 C)   TempSrc: Oral  Oral   SpO2: 100% 100% 95% 95%  Weight:      Height:          Constitutional: Alert and oriented x 3 . Not in any apparent distress.  Morbidly obese HEENT:      Head: Normocephalic and atraumatic.         Eyes: PERLA, EOMI, Conjunctivae pallor, Sclera is non-icteric.       Mouth/Throat: Mucous membranes are moist.       Neck: Supple with no signs of  meningismus. Cardiovascular: Regular rate and rhythm. No murmurs, gallops, or rubs. 2+ symmetrical distal pulses are present . No JVD. No LE edema Respiratory: Respiratory effort normal .crackles in both lung bases bilaterally (Rt > Lt)  no wheezes or rhonchi.  Gastrointestinal: Soft, non tender, and non distended with positive bowel sounds.  Genitourinary: No CVA tenderness. Musculoskeletal: Nontender with normal range of motion in all extremities. No cyanosis, or erythema of extremities.  Dressing over right foot Neurologic:  Face is symmetric. Moving all extremities. No gross focal neurologic deficits . Skin: Skin is warm, dry.  No rash or ulcers Psychiatric: Mood and affect are normal    Labs on Admission: I have personally reviewed following labs and imaging studies  CBC: Recent Labs  Lab 11/08/20 1445 11/13/20 2330  WBC 10.9* 11.4*  HGB 8.0* 8.3*  HCT 24.6* 26.8*  MCV 95.3 97.5  PLT 304 132*   Basic Metabolic Panel: Recent Labs  Lab 11/08/20 1445 11/09/20 0412 11/10/20 0538 11/13/20 2330  NA 120* 121* 127* 131*  K 4.2 4.3 4.2 4.5  CL 84* 86* 91* 92*  CO2 23 23 28 25   GLUCOSE 193* 217* 78 128*  BUN 52* 59* 43* 57*  CREATININE 7.26* 8.00* 6.11* 7.90*  CALCIUM 6.9* 6.9* 7.3* 7.4*  PHOS  --  5.0*  --   --    GFR: Estimated Creatinine Clearance: 12.4 mL/min (A) (by C-G formula based on SCr of 7.9 mg/dL (H)). Liver Function Tests: Recent Labs  Lab 11/09/20 0412  ALBUMIN 2.7*   No results for input(s): LIPASE, AMYLASE in the last 168 hours. No results for input(s): AMMONIA in the last 168 hours. Coagulation Profile: No  results for input(s): INR, PROTIME in the last 168 hours. Cardiac Enzymes: No results for input(s): CKTOTAL, CKMB, CKMBINDEX, TROPONINI in the last 168 hours. BNP (last 3 results) No results for input(s): PROBNP in the last 8760 hours. HbA1C: No results for input(s): HGBA1C in the last 72 hours. CBG: Recent Labs  Lab 11/09/20 1519  11/09/20 2153 11/10/20 0839 11/10/20 1214 11/10/20 1633  GLUCAP 205* 175* 133* 91 207*   Lipid Profile: No results for input(s): CHOL, HDL, LDLCALC, TRIG, CHOLHDL, LDLDIRECT in the last 72 hours. Thyroid Function Tests: No results for input(s): TSH, T4TOTAL, FREET4, T3FREE, THYROIDAB in the last 72 hours. Anemia Panel: No results for input(s): VITAMINB12, FOLATE, FERRITIN, TIBC, IRON, RETICCTPCT in the last 72 hours. Urine analysis:    Component Value Date/Time   COLORURINE YELLOW (A) 04/02/2017 1548   APPEARANCEUR HAZY (A) 04/02/2017 1548   LABSPEC 1.008 04/02/2017 1548   PHURINE 5.0 04/02/2017 [place] 04/02/2017 1548   HGBUR NEGATIVE 04/02/2017 1548   BILIRUBINUR NEGATIVE 04/02/2017 [place] 04/02/2017 1548   PROTEINUR 100 (A) 04/02/2017 1548   NITRITE NEGATIVE 04/02/2017 1548   LEUKOCYTESUR NEGATIVE 04/02/2017 1548    Radiological Exams on Admission: DG Chest 2 View  Result Date: 11/14/2020 CLINICAL DATA:  Shortness of breath. EXAM: CHEST - 2 VIEW COMPARISON:  11/09/2020 FINDINGS: Stable cardiomediastinal contours. There has been significant interval increase in volume of right pleural effusion. Similar pulmonary vascular congestion. Mild atelectasis noted in the left lower lobe. IMPRESSION: 1. Significant increase in volume of right pleural effusion. 2. Pulmonary vascular congestion. Electronically Signed   By: Kerby Moors M.D.   On: 11/14/2020 09:06     Assessment/Plan Principal Problem:   Acute on chronic diastolic CHF (congestive heart failure) (HCC) Active Problems:   Diabetes mellitus with ESRD (end-stage renal disease) (HCC)   Obesity, Class III, BMI 40-49.9 (morbid obesity) ([place])   Acute on chronic respiratory failure (HCC)   Extreme obesity with alveolar hypoventilation (HCC)   PVD (peripheral vascular disease) (HCC)   Hyponatremia   Anemia of chronic disease     Patient is a 50 year old morbidly obese African-American  female who presents to the ER for evaluation of worsening shortness of breath of her baseline and has an increased oxygen requirement as she is now on 6 L above her baseline of 3 to 4 L.  Imaging shows an increased right pleural effusion as well as pulmonary vascular congestion.     Acute on chronic diastolic dysfunction CHF Patient presents for evaluation of worsening shortness of breath from her baseline and imaging is consistent with pulmonary vascular congestion and increased right pleural effusion. [name] last 2D echocardiogram from 09/18 shows an LVEF of 55% Patient is scheduled for hemodialysis today Optimize blood pressure control Check daily weights      Acute on chronic respiratory failure Multifactorial and secondary to acute diastolic CHF as well as obesity hypoventilation syndrome At baseline patient wears 3 to 4 L of oxygen but she is currently on 6 L Will attempt to wean patient down to her baseline oxygen requirement once her acute illness is improved    Diabetic foot infection with gangrene of the fifth toe associated with osteomyelitis Status post amputation of fifth ray and I&D on 8/27 [place] procedure on 10/26/2020 Continue IV vancomycin and cefepime with dialysis Duration of antibiotic therapy is 4 weeks Antibiotic end date 9/28    Diabetes mellitus with complications of ESRD on HD Nephrology consulted TTS  dialysis schedule Continue long-acting insulin with sliding scale coverage Maintain consistent carbohydrate diet     Anemia of chronic kidney disease Stable, will monitor     Peripheral vascular disease Continue aspirin    Hyponatremia Most likely secondary to volume overload Expect improvement in serum sodium level following renal replacement therapy     Morbid obesity (BMI 55) This complicates overall care and prognosis     Physical deconditioning Following recent hospitalization Patient is nonweightbearing on her right  foot Recommendations were for skilled nursing facility but patient chose to go home with home health and has a wheelchair at home       DVT prophylaxis: Heparin Code Status: full code  Family Communication: Greater than 50% of time was spent discussing patient's condition and plan of with her at the bedside.  All questions and concerns have been addressed.  She verbalizes understanding and agrees with the plan. Disposition Plan: Back to previous home environment Consults called: Nephrology  Status:At the time of admission, it appears that the appropriate admission status for this patient is inpatient. This is judged to be reasonable and necessary to provide the required intensity of service to ensure the patient's safety given the presenting symptoms, physical exam findings, and initial radiographic and laboratory data in the context of their comorbid conditions. Patient requires inpatient status due to high intensity of service, high risk for further deterioration and high frequency of surveillance required.     Collier Bullock MD [facility]     11/14/2020, 9:56 AM

## 2020-11-14 NOTE — ED Triage Notes (Signed)
Patient will be going to dialysis.

## 2020-11-15 ENCOUNTER — Observation Stay (HOSPITAL_BASED_OUTPATIENT_CLINIC_OR_DEPARTMENT_OTHER)
Admit: 2020-11-15 | Discharge: 2020-11-15 | Disposition: A | Discharge status: Home / Self Care | Payer: Medicare HMO | Attending: Internal Medicine | Admitting: Internal Medicine

## 2020-11-15 ENCOUNTER — Encounter: Payer: Self-pay | Admitting: Internal Medicine

## 2020-11-15 DIAGNOSIS — N186 End stage renal disease: Secondary | ICD-10-CM | POA: Diagnosis not present

## 2020-11-15 DIAGNOSIS — I5031 Acute diastolic (congestive) heart failure: Secondary | ICD-10-CM | POA: Diagnosis not present

## 2020-11-15 DIAGNOSIS — D631 Anemia in chronic kidney disease: Secondary | ICD-10-CM | POA: Diagnosis not present

## 2020-11-15 DIAGNOSIS — I5033 Acute on chronic diastolic (congestive) heart failure: Secondary | ICD-10-CM | POA: Diagnosis not present

## 2020-11-15 DIAGNOSIS — E1122 Type 2 diabetes mellitus with diabetic chronic kidney disease: Secondary | ICD-10-CM | POA: Diagnosis not present

## 2020-11-15 DIAGNOSIS — N2581 Secondary hyperparathyroidism of renal origin: Secondary | ICD-10-CM | POA: Diagnosis not present

## 2020-11-15 DIAGNOSIS — I10 Essential (primary) hypertension: Secondary | ICD-10-CM | POA: Diagnosis not present

## 2020-11-15 DIAGNOSIS — E8770 Fluid overload, unspecified: Secondary | ICD-10-CM | POA: Diagnosis not present

## 2020-11-15 LAB — BASIC METABOLIC PANEL
Anion gap: 14 (ref 5–15)
BUN: 45 mg/dL — ABNORMAL HIGH (ref 6–20)
CO2: 28 mmol/L (ref 22–32)
Calcium: 7.8 mg/dL — ABNORMAL LOW (ref 8.9–10.3)
Chloride: 91 mmol/L — ABNORMAL LOW (ref 98–111)
Creatinine, Ser: 6.72 mg/dL — ABNORMAL HIGH (ref 0.44–1.00)
GFR, Estimated: 7 mL/min — ABNORMAL LOW (ref >60)
Glucose, Bld: 154 mg/dL — ABNORMAL HIGH (ref 70–99)
Potassium: 4.6 mmol/L (ref 3.5–5.1)
Sodium: 133 mmol/L — ABNORMAL LOW (ref 135–145)

## 2020-11-15 LAB — CBG MONITORING, ED
Glucose-Capillary: 150 mg/dL — ABNORMAL HIGH (ref 70–99)
Glucose-Capillary: 153 mg/dL — ABNORMAL HIGH (ref 70–99)
Glucose-Capillary: 215 mg/dL — ABNORMAL HIGH (ref 70–99)

## 2020-11-15 LAB — CBC
HCT: 28.3 % — ABNORMAL LOW (ref 36.0–46.0)
Hemoglobin: 8.7 g/dL — ABNORMAL LOW (ref 12.0–15.0)
MCH: 30.4 pg (ref 26.0–34.0)
MCHC: 30.7 g/dL (ref 30.0–36.0)
MCV: 99 fL (ref 80.0–100.0)
Platelets: 326 10*3/uL (ref 150–400)
RBC: 2.86 MIL/uL — ABNORMAL LOW (ref 3.87–5.11)
RDW: 15.1 % (ref 11.5–15.5)
WBC: 7.8 10*3/uL (ref 4.0–10.5)
nRBC: 0 % (ref 0.0–0.2)

## 2020-11-15 LAB — GLUCOSE, CAPILLARY: Glucose-Capillary: 194 mg/dL — ABNORMAL HIGH (ref 70–99)

## 2020-11-15 LAB — HEPATITIS B SURFACE ANTIGEN: Hepatitis B Surface Ag: NONREACTIVE

## 2020-11-15 LAB — HEPATITIS B SURFACE ANTIBODY,QUALITATIVE: Hep B S Ab: REACTIVE — AB

## 2020-11-15 MED ORDER — PERFLUTREN LIPID MICROSPHERE
1.0000 mL | INTRAVENOUS | Status: AC | PRN
  Administered 2020-11-15: 3 mL via INTRAVENOUS
  Filled 2020-11-15: qty 10

## 2020-11-15 MED ORDER — CALCIUM CARBONATE 1250 (500 CA) MG PO TABS
1250.0000 mg | ORAL_TABLET | Freq: Two times a day (BID) | ORAL | Status: DC
  Administered 2020-11-15 – 2020-11-16 (×2): 1250 mg via ORAL
  Filled 2020-11-15 (×4): qty 1

## 2020-11-15 NOTE — Progress Notes (Signed)
Patient arrived to unit about 1955 tonight. Bedside report received from Penelope. Arrived to unit A&Ox4. 4L O2 visa [place]. No distress noted. Patient oriented to staff and unit and equipment. Safety precautions in place. Call light within reach. Safety education provided. Plan of care reviewed with patient and she agree. Will continue to monitor and endorse.

## 2020-11-15 NOTE — ED Notes (Signed)
Pt taken to dialysis at this time.

## 2020-11-15 NOTE — Progress Notes (Signed)
[facility]  ROUNDING NOTE   Subjective:   Patient seen and evaluated during dialysis   HEMODIALYSIS FLOWSHEET:  Blood Flow Rate (mL/min): 200 mL/min Arterial Pressure (mmHg): -80 mmHg Venous Pressure (mmHg): 70 mmHg Transmembrane Pressure (mmHg): 40 mmHg Ultrafiltration Rate (mL/min): 70 mL/min Dialysate Flow Rate (mL/min): 500 ml/min Conductivity: Machine : 13.8 Conductivity: Machine : 13.8 Dialysis Fluid Bolus: Normal Saline Bolus Amount (mL): 250 mL  Sequential only dialysis today with UF goal 5L States breathing improved, but complains of anxiety related to respiratory status. Requesting breathing treatment   Objective:  Vital signs in last 24 hours:  Temp:  [97.5 F (36.4 C)-97.9 F (36.6 C)] 97.9 F (36.6 C) (09/21 0645) Pulse Rate:  [67-90] 67 (09/21 0645) Resp:  [18-21] 20 (09/21 0645) BP: (115-156)/(65-103) 120/72 (09/21 0645) SpO2:  [92 %-100 %] 92 % (09/21 0645)  Weight change:  Filed Weights   11/13/20 2326  Weight: (!) 146.5 kg    Intake/Output: I/O last 3 completed shifts: In: -  Out: 5000 [Other:5000]   Intake/Output this shift:  No intake/output data recorded.  Physical Exam: General: NAD, sitting up in bed  Head: Normocephalic, atraumatic. Moist oral mucosal membranes  Eyes: Anicteric  Lungs:  Clear to auscultation, O2 2L  Heart: Regular rate and rhythm  Abdomen:  Soft, nontender  Extremities: trace peripheral edema. Right lower extremity in ace dressings.   Neurologic: Alert, moving all four extremities  Skin: No lesions  Access: Left AVF    Basic Metabolic Panel: Recent Labs  Lab 11/08/20 1445 11/09/20 0412 11/10/20 0538 11/13/20 2330 11/15/20 0846  NA 120* 121* 127* 131* 133*  K 4.2 4.3 4.2 4.5 4.6  CL 84* 86* 91* 92* 91*  CO2 23 23 28 25 28   GLUCOSE 193* 217* 78 128* 154*  BUN 52* 59* 43* 57* 45*  CREATININE 7.26* 8.00* 6.11* 7.90* 6.72*  CALCIUM 6.9* 6.9* 7.3* 7.4* 7.8*  PHOS  --  5.0*  --   --   --       Liver Function Tests: Recent Labs  Lab 11/09/20 0412  ALBUMIN 2.7*    No results for input(s): LIPASE, AMYLASE in the last 168 hours. No results for input(s): AMMONIA in the last 168 hours.  CBC: Recent Labs  Lab 11/08/20 1445 11/13/20 2330 11/15/20 0846  WBC 10.9* 11.4* 7.8  HGB 8.0* 8.3* 8.7*  HCT 24.6* 26.8* 28.3*  MCV 95.3 97.5 99.0  PLT 304 405* 326     Cardiac Enzymes: No results for input(s): CKTOTAL, CKMB, CKMBINDEX, TROPONINI in the last 168 hours.  BNP: Invalid input(s): POCBNP  CBG: Recent Labs  Lab 11/10/20 1633 11/14/20 1044 11/14/20 1725 11/14/20 2351 11/15/20 0818  GLUCAP 207* 85 244* 230* 150*     Microbiology: Results for orders placed or performed during the hospital encounter of 11/14/20  Resp Panel by RT-PCR (Flu A&B, Covid) Nasopharyngeal Swab     Status: None   Collection Time: 11/14/20  3:45 PM   Specimen: Nasopharyngeal Swab; Nasopharyngeal(NP) swabs in vial transport medium  Result Value Ref Range Status   SARS Coronavirus 2 by RT PCR NEGATIVE NEGATIVE Final    Comment: (NOTE) SARS-CoV-2 target nucleic acids are NOT DETECTED.  The SARS-CoV-2 RNA is generally detectable in upper respiratory specimens during the acute phase of infection. The lowest concentration of SARS-CoV-2 viral copies this assay can detect is 138 copies/mL. A negative result does not preclude SARS-Cov-2 infection and should not be used as the sole basis for  treatment or other patient management decisions. A negative result may occur with  improper specimen collection/handling, submission of specimen other than nasopharyngeal swab, presence of viral mutation(s) within the areas targeted by this assay, and inadequate number of viral copies(<138 copies/mL). A negative result must be combined with clinical observations, patient history, and epidemiological information. The expected result is Negative.  Fact Sheet for Patients:   EntrepreneurPulse.com.au  Fact Sheet for Healthcare Providers:  IncredibleEmployment.be  This test is no t yet approved or cleared by the [place] FDA and  has been authorized for detection and/or diagnosis of SARS-CoV-2 by FDA under an Emergency Use Authorization (EUA). This EUA will remain  in effect (meaning this test can be used) for the duration of the COVID-19 declaration under Section 564(b)(1) of the Act, 21 U.S.C.section 360bbb-3(b)(1), unless the authorization is terminated  or revoked sooner.       Influenza A by PCR NEGATIVE NEGATIVE Final   Influenza B by PCR NEGATIVE NEGATIVE Final    Comment: (NOTE) The Xpert Xpress SARS-CoV-2/FLU/RSV plus assay is intended as an aid in the diagnosis of influenza from Nasopharyngeal swab specimens and should not be used as a sole basis for treatment. Nasal washings and aspirates are unacceptable for Xpert Xpress SARS-CoV-2/FLU/RSV testing.  Fact Sheet for Patients: EntrepreneurPulse.com.au  Fact Sheet for Healthcare Providers: IncredibleEmployment.be  This test is not yet approved or cleared by the [place] FDA and has been authorized for detection and/or diagnosis of SARS-CoV-2 by FDA under an Emergency Use Authorization (EUA). This EUA will remain in effect (meaning this test can be used) for the duration of the COVID-19 declaration under Section 564(b)(1) of the Act, 21 U.S.C. section 360bbb-3(b)(1), unless the authorization is terminated or revoked.  Performed at [facility], [address] [place], [place] [address]     Coagulation Studies: No results for input(s): LABPROT, INR in the last 72 hours.   Urinalysis: No results for input(s): COLORURINE, LABSPEC, PHURINE, GLUCOSEU, HGBUR, BILIRUBINUR, KETONESUR, PROTEINUR, UROBILINOGEN, NITRITE, LEUKOCYTESUR in the last 72 hours.  Invalid input(s): APPERANCEUR     Imaging: DG Chest 2 View  Result Date: 11/14/2020 CLINICAL DATA:  Shortness of breath. EXAM: CHEST - 2 VIEW COMPARISON:  11/09/2020 FINDINGS: Stable cardiomediastinal contours. There has been significant interval increase in volume of right pleural effusion. Similar pulmonary vascular congestion. Mild atelectasis noted in the left lower lobe. IMPRESSION: 1. Significant increase in volume of right pleural effusion. 2. Pulmonary vascular congestion. Electronically Signed   By: Kerby Moors M.D.   On: 11/14/2020 09:06     Medications:    sodium chloride     sodium chloride     sodium chloride     [START ON 11/16/2020] ceFEPime (MAXIPIME) IV     [START ON 11/16/2020] vancomycin      aspirin EC  81 mg Oral Daily   Chlorhexidine Gluconate Cloth  6 each Topical Q0600   [START ON 11/17/2020] cholecalciferol  4,000 Units Oral Weekly   docusate sodium  100 mg Oral Daily   ferric citrate  210 mg Oral TID WC   fluticasone furoate-vilanterol  1 puff Inhalation Daily   And   umeclidinium bromide  1 puff Inhalation Daily   heparin  5,000 Units Subcutaneous Q8H   insulin aspart  0-6 Units Subcutaneous TID WC   insulin glargine-yfgn  34 Units Subcutaneous QHS   ipratropium-albuterol  3 mL Nebulization TID   linaclotide  290 mcg Oral q morning   loratadine  10  mg Oral Daily   metoprolol tartrate  25 mg Oral BID   multivitamin  1 tablet Oral QHS   pantoprazole  40 mg Oral Daily   sodium chloride flush  3 mL Intravenous Q12H   sodium chloride, sodium chloride, sodium chloride, acetaminophen, albuterol, alteplase, heparin, lidocaine (PF), lidocaine-prilocaine, midodrine, ondansetron **OR** ondansetron (ZOFRAN) IV, oxyCODONE-acetaminophen, pentafluoroprop-tetrafluoroeth, sodium chloride flush  Assessment/ Plan:  [name] is a 50 y.o. black female with end stage renal disease on hemodialysis, diabetes mellitus type II, hypertension, congestive heart failure, COPD, who is admitted to [facility]  on 11/14/2020 for Acute on chronic diastolic CHF (congestive heart failure) ([place]) [I50.33]  [facility] TTS Left AVF 127.5kg  Fluid volume overload with end stage renal disease on dialysis:   -Received dialysis yesterday, UF goal increased to 5L due to shortness of breath and abdominal distention. UF goal met. Receiving sequential treatment today with UF goal 5L. Tolerating treatment well at this time. Next treatment scheduled for Thursday.   Hypertension: 111/75 during dialysis. Currently taking Metoprolol 46m BID and Midodrine as needed  Hyponatremia: secondary to ESRD.  Na 133 - Will continue to correct with dialysis  Diabetes mellitus type II with chronic kidney disease: complicated by diabetic foot ulcer and osteomyelitis. Not well controlled. Hemoglobin A1c 8.9% on 10/15/20. Glucose monitoring and Ssi managed by primary team  Osteomyelitis: with step anginosis - Will continue outpatient antibiotics: cefepime 2g with HD treatments. End date 9/29  Anemia of chronic kidney disease: hemoglobin 8.3, normocytic.  - EPO 10000 units on HD treatments.   Secondary Hyperparathyroidism: with hypocalcemia.  - Calcium not at target    LOS: 0 Hester Forget 9/21/202211:04 AM

## 2020-11-15 NOTE — Progress Notes (Signed)
Spoke with Lanelle Bal at [phone] in central telemetry to transfer patient from ED 32 to [place]

## 2020-11-15 NOTE — ED Notes (Signed)
Laural Golden RN notified of handoff at this time

## 2020-11-15 NOTE — ED Notes (Signed)
Cup of ice given to pt

## 2020-11-15 NOTE — ED Notes (Signed)
Dietary arrived with food tray, pt in dialysis, requested breakfast be brought to pt in dialysis.

## 2020-11-15 NOTE — Progress Notes (Signed)
PROGRESS NOTE    [name]  OIZ:[id number] DOB: Jan 30, 1971 DOA: 11/14/2020 PCP: [name], NP     Brief Narrative:  [name] is a 50 y.o. female with medical history significant for diabetes mellitus with complications of ESRD on dialysis (T/TH/S), chronic diastolic dysfunction CHF, hypertension, morbid obesity, obesity hypoventilation syndrome on oxygen supplementation 3 to 4 L, peripheral vascular disease status post amputation of the fifth right digit as well as I&D of abscess on the right foot, depression and anxiety who was recently discharged from the hospital on 11/10/20 and presents today for evaluation of dizziness and shortness of breath.  Patient was hospitalized 10/15/2020 to 11/10/2020 secondary to gangrene of the right foot with osteomyelitis status post fifth ray amputation and I&D on 8/27 as well as washout procedure 9/1.  She was recommended for IV vancomycin, cefepime for 4 weeks, end date 9/28.  She had declined amputation at that time.  Patient states that she was in her usual state of health following her discharge and had gone to dialysis 2 days prior to this hospitalization where she was found to be about 20lbs above her dry weight.  They were able to pull 6L of fluid during her dialysis. Over the last 24 hours she states that she has felt very dizzy and lightheaded and has had worsening shortness of breath from her baseline.  She checked her blood pressure at home and had a systolic blood pressure in the 90s so she took 1 midodrine. Chest x-ray reviewed by me shows pulmonary vascular congestion.  Significant increase in volume of right pleural effusion.  New events last 24 hours / Subjective: Patient states that she feels mildly improved since admission to the hospital.  Nephrology planning for UF today.  Assessment & Plan:   Principal Problem:   Acute on chronic diastolic CHF (congestive heart failure) (HCC) Active Problems:   Diabetes mellitus with  ESRD (end-stage renal disease) (HCC)   Obesity, Class III, BMI 40-49.9 (morbid obesity) ([place])   Acute on chronic respiratory failure (HCC)   Extreme obesity with alveolar hypoventilation (HCC)   PVD (peripheral vascular disease) (HCC)   Hyponatremia   Anemia of chronic disease   Acute on chronic diastolic CHF exacerbation -Symptoms of SOB, up 20lbs from her dry weight per her report  -Echo on 11/12/2016 showed EF 33%, grade 1 diastolic dysfunction -Chest x-ray showed right pleural effusion, pulmonary vascular congestion -Strict I/O's, daily weights, fluid restriction diet -Fluid management with HD -Repeat echocardiogram this admission  ESRD on HD  -Nephrology following  Acute on chronic hypoxemic respiratory failure -Secondary to above -At baseline patient wears 3 to 4 L of nasal cannula O2 -At time of admission, patient was requiring up to 6 L, now weaned to 4 L on my examination  Diabetic foot infection, osteomyelitis -Status post amputation of fifth ray and I&D on 8/27 -Washout procedure on 10/26/2020 -Continue IV vancomycin and cefepime with dialysis -Duration of antibiotic therapy is 4 week. Antibiotic end date 9/28 -Daily dressing change -Nonweight bearing right foot   DM with renal complication -Continue semglee, SSI   PAD -Aspirin  Demand ischemia -Insetting of ESRD and volume overload   DVT prophylaxis:  heparin injection 5,000 Units Start: 11/14/20 2200  Code Status:     Code Status Orders  (From admission, onward)           Start     Ordered   11/14/20 1023  Full code  Continuous  11/14/20 1023           Code Status History     Date Active Date Inactive Code Status Order ID Comments User Context   10/15/2020 2119 11/10/2020 2242 Full Code [id number]  Para Skeans, MD ED   03/24/2017 1953 04/28/2017 1906 Full Code [id number]  Hillary Bow, MD ED   03/03/2017 1420 03/10/2017 2131 Full Code [id number]  Fritzi Mandes, MD ED   01/07/2017 1725  01/10/2017 1900 Full Code [id number]  Demetrios Loll, MD Inpatient   11/08/2016 0450 11/16/2016 1717 Full Code [id number]  Hugelmeyer, Alexis, DO Inpatient   06/25/2016 1447 06/29/2016 1815 Full Code [id number]  Hillary Bow, MD ED   08/09/2015 1757 08/13/2015 1721 Full Code [id number]  Bettey Costa, MD Inpatient   12/31/2014 2025 01/03/2015 1749 Full Code [id number]  Hower, Aaron Mose, MD ED   12/15/2014 1154 12/19/2014 1657 Full Code [id number]  Hillary Bow, MD ED      Family Communication: Non. Family at bedside, sleeping.  Disposition Plan:  Status is: Observation  The patient will require care spanning > 2 midnights and should be moved to inpatient because: Inpatient level of care appropriate due to severity of illness  Dispo: The patient is from: Home              Anticipated d/c is to: Home              Patient currently is not medically stable to d/c.   Difficult to place patient No      Consultants:  Nephrology   Procedures:  None   Antimicrobials:  Anti-infectives (From admission, onward)    Start     Dose/Rate Route Frequency Ordered Stop   11/16/20 1200  ceFEPIme (MAXIPIME) 2 g in sodium chloride 0.9 % 100 mL IVPB        2 g 200 mL/hr over 30 Minutes Intravenous Every T-Th-Sa (Hemodialysis) 11/14/20 1744 11/23/20 1159   11/16/20 1200  vancomycin (VANCOCIN) IVPB 1000 mg/200 mL premix        1,000 mg 200 mL/hr over 60 Minutes Intravenous Every T-Th-Sa (Hemodialysis) 11/14/20 2222 11/22/20 2359   11/14/20 2230  vancomycin (VANCOCIN) IVPB 1000 mg/200 mL premix        1,000 mg 200 mL/hr over 60 Minutes Intravenous  Once 11/14/20 2223 11/15/20 0055   11/14/20 1745  ceFEPIme (MAXIPIME) 1 g in sodium chloride 0.9 % 100 mL IVPB       Note to Pharmacy: Patient in dialysis now, please take/send to HD   1 g 200 mL/hr over 30 Minutes Intravenous  Once 11/14/20 1744 11/14/20 2200   11/14/20 1200  ceFEPime (MAXIPIME) IVPB  Status:  Discontinued       Note to Pharmacy: To be given at HD  center Indication:  necrotic diabetic foot infection Last Day of Therapy:  11/22/2020 Pre-HD vancomycin level 15-19mcg/ml or as otherwise directed per HD center protocol Labs weekly while on IV antibiotics Fax weekly labs to  Dr. [phone]   2 g Intravenous Every T-Th-Sa (Hemodialysis) 11/14/20 1023 11/14/20 1744   11/14/20 1200  vancomycin IVPB  Status:  Discontinued       Note to Pharmacy: Indication:  necrotic diabetic foot infection To be given at HD center Last Day of Therapy:  11/22/2020 Pre-HD vancomycin level 15-65mcg/ml or as otherwise directed per HD center protocol Labs weekly while on IV antibiotics: CBC w/ differential, CMP Fax weekly labs to  Dr>[phone]   1,000 mg Intravenous Every T-Th-Sa (  Hemodialysis) 11/14/20 1023 11/14/20 1754   11/14/20 0945  ceFEPIme (MAXIPIME) 1 g in sodium chloride 0.9 % 100 mL IVPB       Note to Pharmacy: Patient in dialysis now, please take/send to HD   1 g 200 mL/hr over 30 Minutes Intravenous Once in dialysis 11/14/20 0935 11/14/20 1230        Objective: Vitals:   11/15/20 1200 11/15/20 1215 11/15/20 1230 11/15/20 1245  BP: 134/73 138/87 116/64 126/80  Pulse: 74 75 78 77  Resp: 18 18 18 18   Temp:      TempSrc:      SpO2:      Weight:      Height:        Intake/Output Summary (Last 24 hours) at 11/15/2020 1408 Last data filed at 11/15/2020 1245 Gross per 24 hour  Intake --  Output 5000 ml  Net -5000 ml   Filed Weights   11/13/20 2326  Weight: (!) 146.5 kg    Examination:  General exam: Appears calm and comfortable  Respiratory system: Diminished breath sounds bilaterally without respiratory distress or conversational dyspnea.  On 4 L nasal cannula O2 Cardiovascular system: S1 & S2 heard, RRR. No murmurs. No pedal edema. Gastrointestinal system: Abdomen is nondistended, soft and nontender. Normal bowel sounds heard. Central nervous system: Alert and oriented. No focal neurological deficits.  Speech clear.  Extremities: Symmetric in appearance  Skin: No rashes, lesions or ulcers on exposed skin, right foot in dressing Psychiatry: Judgement and insight appear normal. Mood & affect appropriate.   Data Reviewed: I have personally reviewed following labs and imaging studies  CBC: Recent Labs  Lab 11/08/20 1445 11/13/20 2330 11/15/20 0846  WBC 10.9* 11.4* 7.8  HGB 8.0* 8.3* 8.7*  HCT 24.6* 26.8* 28.3*  MCV 95.3 97.5 99.0  PLT 304 405* 784   Basic Metabolic Panel: Recent Labs  Lab 11/08/20 1445 11/09/20 0412 11/10/20 0538 11/13/20 2330 11/15/20 0846  NA 120* 121* 127* 131* 133*  K 4.2 4.3 4.2 4.5 4.6  CL 84* 86* 91* 92* 91*  CO2 23 23 28 25 28   GLUCOSE 193* 217* 78 128* 154*  BUN 52* 59* 43* 57* 45*  CREATININE 7.26* 8.00* 6.11* 7.90* 6.72*  CALCIUM 6.9* 6.9* 7.3* 7.4* 7.8*  PHOS  --  5.0*  --   --   --    GFR: Estimated Creatinine Clearance: 14.6 mL/min (A) (by C-G formula based on SCr of 6.72 mg/dL (H)). Liver Function Tests: Recent Labs  Lab 11/09/20 0412  ALBUMIN 2.7*   No results for input(s): LIPASE, AMYLASE in the last 168 hours. No results for input(s): AMMONIA in the last 168 hours. Coagulation Profile: No results for input(s): INR, PROTIME in the last 168 hours. Cardiac Enzymes: No results for input(s): CKTOTAL, CKMB, CKMBINDEX, TROPONINI in the last 168 hours. BNP (last 3 results) No results for input(s): PROBNP in the last 8760 hours. HbA1C: No results for input(s): HGBA1C in the last 72 hours. CBG: Recent Labs  Lab 11/10/20 1633 11/14/20 1044 11/14/20 1725 11/14/20 2351 11/15/20 0818  GLUCAP 207* 85 244* 230* 150*   Lipid Profile: No results for input(s): CHOL, HDL, LDLCALC, TRIG, CHOLHDL, LDLDIRECT in the last 72 hours. Thyroid Function Tests: No results for input(s): TSH, T4TOTAL, FREET4, T3FREE, THYROIDAB in the last 72 hours. Anemia Panel: No results for input(s): VITAMINB12, FOLATE, FERRITIN, TIBC, IRON, RETICCTPCT in the  last 72 hours. Sepsis Labs: No results for input(s): PROCALCITON, LATICACIDVEN in the last 168  hours.  Recent Results (from the past 240 hour(s))  Resp Panel by RT-PCR (Flu A&B, Covid) Nasopharyngeal Swab     Status: None   Collection Time: 11/14/20  3:45 PM   Specimen: Nasopharyngeal Swab; Nasopharyngeal(NP) swabs in vial transport medium  Result Value Ref Range Status   SARS Coronavirus 2 by RT PCR NEGATIVE NEGATIVE Final    Comment: (NOTE) SARS-CoV-2 target nucleic acids are NOT DETECTED.  The SARS-CoV-2 RNA is generally detectable in upper respiratory specimens during the acute phase of infection. The lowest concentration of SARS-CoV-2 viral copies this assay can detect is 138 copies/mL. A negative result does not preclude SARS-Cov-2 infection and should not be used as the sole basis for treatment or other patient management decisions. A negative result may occur with  improper specimen collection/handling, submission of specimen other than nasopharyngeal swab, presence of viral mutation(s) within the areas targeted by this assay, and inadequate number of viral copies(<138 copies/mL). A negative result must be combined with clinical observations, patient history, and epidemiological information. The expected result is Negative.  Fact Sheet for Patients:  EntrepreneurPulse.com.au  Fact Sheet for Healthcare Providers:  IncredibleEmployment.be  This test is no t yet approved or cleared by the [place] FDA and  has been authorized for detection and/or diagnosis of SARS-CoV-2 by FDA under an Emergency Use Authorization (EUA). This EUA will remain  in effect (meaning this test can be used) for the duration of the COVID-19 declaration under Section 564(b)(1) of the Act, 21 U.S.C.section 360bbb-3(b)(1), unless the authorization is terminated  or revoked sooner.       Influenza A by PCR NEGATIVE NEGATIVE Final   Influenza B by PCR NEGATIVE  NEGATIVE Final    Comment: (NOTE) The Xpert Xpress SARS-CoV-2/FLU/RSV plus assay is intended as an aid in the diagnosis of influenza from Nasopharyngeal swab specimens and should not be used as a sole basis for treatment. Nasal washings and aspirates are unacceptable for Xpert Xpress SARS-CoV-2/FLU/RSV testing.  Fact Sheet for Patients: EntrepreneurPulse.com.au  Fact Sheet for Healthcare Providers: IncredibleEmployment.be  This test is not yet approved or cleared by the [place] FDA and has been authorized for detection and/or diagnosis of SARS-CoV-2 by FDA under an Emergency Use Authorization (EUA). This EUA will remain in effect (meaning this test can be used) for the duration of the COVID-19 declaration under Section 564(b)(1) of the Act, 21 U.S.C. section 360bbb-3(b)(1), unless the authorization is terminated or revoked.  Performed at [facility], [address], [place], [place] [address]       Radiology Studies: DG Chest 2 View  Result Date: 11/14/2020 CLINICAL DATA:  Shortness of breath. EXAM: CHEST - 2 VIEW COMPARISON:  11/09/2020 FINDINGS: Stable cardiomediastinal contours. There has been significant interval increase in volume of right pleural effusion. Similar pulmonary vascular congestion. Mild atelectasis noted in the left lower lobe. IMPRESSION: 1. Significant increase in volume of right pleural effusion. 2. Pulmonary vascular congestion. Electronically Signed   By: Kerby Moors M.D.   On: 11/14/2020 09:06      Scheduled Meds:  aspirin EC  81 mg Oral Daily   Chlorhexidine Gluconate Cloth  6 each Topical Q0600   [START ON 11/17/2020] cholecalciferol  4,000 Units Oral Weekly   docusate sodium  100 mg Oral Daily   ferric citrate  210 mg Oral TID WC   fluticasone furoate-vilanterol  1 puff Inhalation Daily   And   umeclidinium bromide  1 puff Inhalation Daily   heparin  5,000  Units Subcutaneous Q8H   insulin  aspart  0-6 Units Subcutaneous TID WC   insulin glargine-yfgn  34 Units Subcutaneous QHS   ipratropium-albuterol  3 mL Nebulization TID   linaclotide  290 mcg Oral q morning   loratadine  10 mg Oral Daily   metoprolol tartrate  25 mg Oral BID   multivitamin  1 tablet Oral QHS   pantoprazole  40 mg Oral Daily   sodium chloride flush  3 mL Intravenous Q12H   Continuous Infusions:  sodium chloride     sodium chloride     sodium chloride     [START ON 11/16/2020] ceFEPime (MAXIPIME) IV     [START ON 11/16/2020] vancomycin       LOS: 0 days      Time spent: 30 minutes   Dessa Phi, DO [facility] 11/15/2020, 2:08 PM   Available via Epic secure chat 7am-7pm After these hours, please refer to coverage provider listed on [email or web address]

## 2020-11-15 NOTE — ED Notes (Signed)
Pt brought back from dialysis at this time.

## 2020-11-15 NOTE — Progress Notes (Signed)
Patient tolerated tx well with no complaints and a completed net UF goal of 5.0

## 2020-11-15 NOTE — ED Notes (Signed)
RN Laural Golden with another pt during report call. 2A Charge RN- Abigail Butts given verbal report on pt due to transports arrival to take pt to assigned room. No questions posed to this RN at this time from [facility]. Pt taken to 2A room 252 at this time

## 2020-11-15 NOTE — ED Notes (Signed)
MD Maylene Roes informed of this pt's AM medications held d/t this pt OTF status, in dialysis.

## 2020-11-16 DIAGNOSIS — Z992 Dependence on renal dialysis: Secondary | ICD-10-CM | POA: Diagnosis not present

## 2020-11-16 DIAGNOSIS — N186 End stage renal disease: Secondary | ICD-10-CM | POA: Diagnosis not present

## 2020-11-16 DIAGNOSIS — E871 Hypo-osmolality and hyponatremia: Secondary | ICD-10-CM | POA: Diagnosis not present

## 2020-11-16 DIAGNOSIS — E8770 Fluid overload, unspecified: Secondary | ICD-10-CM | POA: Diagnosis not present

## 2020-11-16 DIAGNOSIS — R531 Weakness: Secondary | ICD-10-CM | POA: Diagnosis not present

## 2020-11-16 DIAGNOSIS — Z09 Encounter for follow-up examination after completed treatment for conditions other than malignant neoplasm: Secondary | ICD-10-CM | POA: Diagnosis not present

## 2020-11-16 DIAGNOSIS — I5033 Acute on chronic diastolic (congestive) heart failure: Secondary | ICD-10-CM | POA: Diagnosis not present

## 2020-11-16 DIAGNOSIS — Z7401 Bed confinement status: Secondary | ICD-10-CM | POA: Diagnosis not present

## 2020-11-16 DIAGNOSIS — D631 Anemia in chronic kidney disease: Secondary | ICD-10-CM | POA: Diagnosis not present

## 2020-11-16 DIAGNOSIS — R42 Dizziness and giddiness: Secondary | ICD-10-CM | POA: Diagnosis not present

## 2020-11-16 DIAGNOSIS — Z89421 Acquired absence of other right toe(s): Secondary | ICD-10-CM | POA: Diagnosis not present

## 2020-11-16 DIAGNOSIS — Z8739 Personal history of other diseases of the musculoskeletal system and connective tissue: Secondary | ICD-10-CM | POA: Diagnosis not present

## 2020-11-16 DIAGNOSIS — E1122 Type 2 diabetes mellitus with diabetic chronic kidney disease: Secondary | ICD-10-CM | POA: Diagnosis not present

## 2020-11-16 DIAGNOSIS — I10 Essential (primary) hypertension: Secondary | ICD-10-CM | POA: Diagnosis not present

## 2020-11-16 LAB — CBC
HCT: 30 % — ABNORMAL LOW (ref 36.0–46.0)
Hemoglobin: 9 g/dL — ABNORMAL LOW (ref 12.0–15.0)
MCH: 29.5 pg (ref 26.0–34.0)
MCHC: 30 g/dL (ref 30.0–36.0)
MCV: 98.4 fL (ref 80.0–100.0)
Platelets: 328 K/uL (ref 150–400)
RBC: 3.05 MIL/uL — ABNORMAL LOW (ref 3.87–5.11)
RDW: 14.9 % (ref 11.5–15.5)
WBC: 7.7 K/uL (ref 4.0–10.5)
nRBC: 0 % (ref 0.0–0.2)

## 2020-11-16 LAB — ECHOCARDIOGRAM COMPLETE
Area-P 1/2: 2.62 cm2
S' Lateral: 3 cm

## 2020-11-16 LAB — VANCOMYCIN, RANDOM: Vancomycin Rm: 20

## 2020-11-16 LAB — GLUCOSE, CAPILLARY
Glucose-Capillary: 146 mg/dL — ABNORMAL HIGH (ref 70–99)
Glucose-Capillary: 103 mg/dL — ABNORMAL HIGH (ref 70–99)

## 2020-11-16 LAB — BASIC METABOLIC PANEL
Anion gap: 16 — ABNORMAL HIGH (ref 5–15)
BUN: 58 mg/dL — ABNORMAL HIGH (ref 6–20)
CO2: 24 mmol/L (ref 22–32)
Calcium: 7.8 mg/dL — ABNORMAL LOW (ref 8.9–10.3)
Chloride: 89 mmol/L — ABNORMAL LOW (ref 98–111)
Creatinine, Ser: 7.45 mg/dL — ABNORMAL HIGH (ref 0.44–1.00)
GFR, Estimated: 6 mL/min — ABNORMAL LOW (ref >60)
Glucose, Bld: 160 mg/dL — ABNORMAL HIGH (ref 70–99)
Potassium: 4.6 mmol/L (ref 3.5–5.1)
Sodium: 129 mmol/L — ABNORMAL LOW (ref 135–145)

## 2020-11-16 LAB — HEPATITIS B SURFACE ANTIBODY, QUANTITATIVE: Hep B S AB Quant (Post): 15.1 m[IU]/mL (ref >9.9)

## 2020-11-16 LAB — MRSA NEXT GEN BY PCR, NASAL: MRSA by PCR Next Gen: NOT DETECTED

## 2020-11-16 MED ORDER — DIPHENHYDRAMINE HCL 25 MG PO CAPS
25.0000 mg | ORAL_CAPSULE | Freq: Once | ORAL | Status: AC
  Administered 2020-11-16: 25 mg via ORAL
  Filled 2020-11-16: qty 1

## 2020-11-16 MED ORDER — IPRATROPIUM-ALBUTEROL 0.5-2.5 (3) MG/3ML IN SOLN
3.0000 mL | Freq: Three times a day (TID) | RESPIRATORY_TRACT | Status: DC
  Administered 2020-11-16: 3 mL via RESPIRATORY_TRACT
  Filled 2020-11-16: qty 3

## 2020-11-16 NOTE — Progress Notes (Signed)
PT Cancellation Note  Patient Details Name: COLLETTE PESCADOR MRN: [id number] DOB: 18-Sep-1970   Cancelled Treatment:    Reason Eval/Treat Not Completed: PT screened, no needs identified, will sign off. Patient is at her same functional status since discharge last week on Friday 9/16. Planning for discharge home today with HHPT. Will sign off.     Malory Spurr 11/16/2020, 3:27 PM

## 2020-11-16 NOTE — Progress Notes (Signed)
Tx completed, pt pulled of 5kg. Blood pressure remained stable, no extended bleeding time when deaccessed.

## 2020-11-16 NOTE — Progress Notes (Signed)
Tx initiated, T/B+, cannulated successfully with 16g needles x2, uf goal of 5kg.

## 2020-11-16 NOTE — Progress Notes (Signed)
Patient blood pressure cuff was loose and hanging, RN adjusted cuff and rechecked blood pressure. Patient is stable and alert; states no complaints at this time.

## 2020-11-16 NOTE — Progress Notes (Signed)
PT Cancellation Note  Patient Details Name: [name] MRN: [id number] DOB: 04-Sep-1970   Cancelled Treatment:    Reason Eval/Treat Not Completed: Patient at procedure or test/unavailable. Patient currently at HD. Will re-attempt later today if able.    Cristianna Cyr 11/16/2020, 9:36 AM

## 2020-11-16 NOTE — Progress Notes (Signed)
[facility]  ROUNDING NOTE   Subjective:   Patient seen and evaluated during dialysis     HEMODIALYSIS FLOWSHEET:  Blood Flow Rate (mL/min): 400 mL/min Arterial Pressure (mmHg): -200 mmHg Venous Pressure (mmHg): 200 mmHg Transmembrane Pressure (mmHg): 80 mmHg Ultrafiltration Rate (mL/min): 1570 mL/min Dialysate Flow Rate (mL/min): 500 ml/min Conductivity: Machine : 13.8 Conductivity: Machine : 13.8 Dialysis Fluid Bolus: Normal Saline Bolus Amount (mL): 250 mL  Tolerating dialysis at this time No complaints States breathing improved and abdomen less distended   Objective:  Vital signs in last 24 hours:  Temp:  [97.4 F (36.3 C)-98.9 F (37.2 C)] 98.7 F (37.1 C) (09/22 0927) Pulse Rate:  [73-89] 83 (09/22 0927) Resp:  [16-20] 18 (09/22 1045) BP: (39-153)/(24-121) 147/121 (09/22 1045) SpO2:  [94 %-100 %] 100 % (09/22 1115) Weight:  [150.4 kg] 150.4 kg (09/22 0517)  Weight change:  Filed Weights   11/13/20 2326 11/16/20 0517  Weight: (!) 146.5 kg (!) 150.4 kg    Intake/Output: I/O last 3 completed shifts: In: 240 [P.O.:240] Out: 5000 [Other:5000]   Intake/Output this shift:  No intake/output data recorded.  Physical Exam: General: NAD, sitting up in bed  Head: Normocephalic, atraumatic. Moist oral mucosal membranes  Eyes: Anicteric  Lungs:  Clear to auscultation, O2 2L  Heart: Regular rate and rhythm  Abdomen:  Soft, nontender  Extremities: trace peripheral edema. Right lower extremity in ace dressings.   Neurologic: Alert, moving all four extremities  Skin: No lesions  Access: Left AVF    Basic Metabolic Panel: Recent Labs  Lab 11/10/20 0538 11/13/20 2330 11/15/20 0846 11/16/20 0618  NA 127* 131* 133* 129*  K 4.2 4.5 4.6 4.6  CL 91* 92* 91* 89*  CO2 28 25 28 24   GLUCOSE 78 128* 154* 160*  BUN 43* 57* 45* 58*  CREATININE 6.11* 7.90* 6.72* 7.45*  CALCIUM 7.3* 7.4* 7.8* 7.8*     Liver Function Tests: No results for input(s):  AST, ALT, ALKPHOS, BILITOT, PROT, ALBUMIN in the last 168 hours.  No results for input(s): LIPASE, AMYLASE in the last 168 hours. No results for input(s): AMMONIA in the last 168 hours.  CBC: Recent Labs  Lab 11/13/20 2330 11/15/20 0846 11/16/20 0618  WBC 11.4* 7.8 7.7  HGB 8.3* 8.7* 9.0*  HCT 26.8* 28.3* 30.0*  MCV 97.5 99.0 98.4  PLT 405* 326 328     Cardiac Enzymes: No results for input(s): CKTOTAL, CKMB, CKMBINDEX, TROPONINI in the last 168 hours.  BNP: Invalid input(s): POCBNP  CBG: Recent Labs  Lab 11/15/20 0818 11/15/20 1412 11/15/20 1633 11/15/20 2126 11/16/20 0908  GLUCAP 150* 153* 215* 194* 146*     Microbiology: Results for orders placed or performed during the hospital encounter of 11/14/20  Resp Panel by RT-PCR (Flu A&B, Covid) Nasopharyngeal Swab     Status: None   Collection Time: 11/14/20  3:45 PM   Specimen: Nasopharyngeal Swab; Nasopharyngeal(NP) swabs in vial transport medium  Result Value Ref Range Status   SARS Coronavirus 2 by RT PCR NEGATIVE NEGATIVE Final    Comment: (NOTE) SARS-CoV-2 target nucleic acids are NOT DETECTED.  The SARS-CoV-2 RNA is generally detectable in upper respiratory specimens during the acute phase of infection. The lowest concentration of SARS-CoV-2 viral copies this assay can detect is 138 copies/mL. A negative result does not preclude SARS-Cov-2 infection and should not be used as the sole basis for treatment or other patient management decisions. A negative result may occur with  improper  specimen collection/handling, submission of specimen other than nasopharyngeal swab, presence of viral mutation(s) within the areas targeted by this assay, and inadequate number of viral copies(<138 copies/mL). A negative result must be combined with clinical observations, patient history, and epidemiological information. The expected result is Negative.  Fact Sheet for Patients:   EntrepreneurPulse.com.au  Fact Sheet for Healthcare Providers:  IncredibleEmployment.be  This test is no t yet approved or cleared by the [place] FDA and  has been authorized for detection and/or diagnosis of SARS-CoV-2 by FDA under an Emergency Use Authorization (EUA). This EUA will remain  in effect (meaning this test can be used) for the duration of the COVID-19 declaration under Section 564(b)(1) of the Act, 21 U.S.C.section 360bbb-3(b)(1), unless the authorization is terminated  or revoked sooner.       Influenza A by PCR NEGATIVE NEGATIVE Final   Influenza B by PCR NEGATIVE NEGATIVE Final    Comment: (NOTE) The Xpert Xpress SARS-CoV-2/FLU/RSV plus assay is intended as an aid in the diagnosis of influenza from Nasopharyngeal swab specimens and should not be used as a sole basis for treatment. Nasal washings and aspirates are unacceptable for Xpert Xpress SARS-CoV-2/FLU/RSV testing.  Fact Sheet for Patients: EntrepreneurPulse.com.au  Fact Sheet for Healthcare Providers: IncredibleEmployment.be  This test is not yet approved or cleared by the [place] FDA and has been authorized for detection and/or diagnosis of SARS-CoV-2 by FDA under an Emergency Use Authorization (EUA). This EUA will remain in effect (meaning this test can be used) for the duration of the COVID-19 declaration under Section 564(b)(1) of the Act, 21 U.S.C. section 360bbb-3(b)(1), unless the authorization is terminated or revoked.  Performed at [facility], [address] [place], [place] [address]   MRSA Next Gen by PCR, Nasal     Status: None   Collection Time: 11/16/20 12:15 AM   Specimen: Nasal Mucosa; Nasal Swab  Result Value Ref Range Status   MRSA by PCR Next Gen NOT DETECTED NOT DETECTED Final    Comment: (NOTE) The GeneXpert MRSA Assay (FDA approved for NASAL specimens only), is one component of a  comprehensive MRSA colonization surveillance program. It is not intended to diagnose MRSA infection nor to guide or monitor treatment for MRSA infections. Test performance is not FDA approved in patients less than 89 years old. Performed at [facility], [address] [place], [place] [address]     Coagulation Studies: No results for input(s): LABPROT, INR in the last 72 hours.   Urinalysis: No results for input(s): COLORURINE, LABSPEC, PHURINE, GLUCOSEU, HGBUR, BILIRUBINUR, KETONESUR, PROTEINUR, UROBILINOGEN, NITRITE, LEUKOCYTESUR in the last 72 hours.  Invalid input(s): APPERANCEUR    Imaging: No results found.   Medications:    sodium chloride     sodium chloride     sodium chloride     ceFEPime (MAXIPIME) IV Stopped (11/16/20 1205)   vancomycin      aspirin EC  81 mg Oral Daily   calcium carbonate  1,250 mg Oral BID WC   Chlorhexidine Gluconate Cloth  6 each Topical Q0600   [START ON 11/17/2020] cholecalciferol  4,000 Units Oral Weekly   docusate sodium  100 mg Oral Daily   ferric citrate  210 mg Oral TID WC   fluticasone furoate-vilanterol  1 puff Inhalation Daily   And   umeclidinium bromide  1 puff Inhalation Daily   heparin  5,000 Units Subcutaneous Q8H   insulin aspart  0-6 Units Subcutaneous TID WC   insulin glargine-yfgn  34 Units Subcutaneous QHS   ipratropium-albuterol  3 mL Nebulization TID   linaclotide  290 mcg Oral q morning   loratadine  10 mg Oral Daily   metoprolol tartrate  25 mg Oral BID   multivitamin  1 tablet Oral QHS   pantoprazole  40 mg Oral Daily   sodium chloride flush  3 mL Intravenous Q12H   sodium chloride, sodium chloride, sodium chloride, acetaminophen, albuterol, alteplase, heparin, lidocaine (PF), lidocaine-prilocaine, midodrine, ondansetron **OR** ondansetron (ZOFRAN) IV, oxyCODONE-acetaminophen, pentafluoroprop-tetrafluoroeth, sodium chloride flush  Assessment/ Plan:  [name] is a 50 y.o. black female  with end stage renal disease on hemodialysis, diabetes mellitus type II, hypertension, congestive heart failure, COPD, who is admitted to [facility] on 11/14/2020 for Acute pulmonary edema ([place]) [J81.0] Pleural effusion [J90] Lightheaded [R42] ESRD (end stage renal disease) ([place]) [N18.6] Acute on chronic diastolic CHF (congestive heart failure) ([place]) [I50.33]  [facility] TTS Left AVF 127.5kg  Fluid volume overload with end stage renal disease on dialysis:   -Received sequential dialysis yesterday, UF goal of 5L removed. Currently receiving dialysis with UF goal 5L. Tolerating treatment well at this time. Next treatment scheduled for Saturday  Hypertension: 126/70 during dialysis. Currently taking Metoprolol 25mg  BID and Midodrine as needed  Hyponatremia: secondary to ESRD.  Na 129 - Will correct with dialysis  Diabetes mellitus type II with chronic kidney disease: complicated by diabetic foot ulcer and osteomyelitis. Not well controlled. Hemoglobin A1c 8.9% on 10/15/20. Glucose monitoring and Ssi managed by primary team  Osteomyelitis: with step anginosis - Will continue outpatient antibiotics: Vanc and cefepime 2g with HD treatments. End date 9/29  Anemia of chronic kidney disease: hemoglobin 9.0, normocytic.  - EPO 10000 units on HD treatments.   Secondary Hyperparathyroidism: with hypocalcemia.  - Calcium below target    LOS: 0 Iaan Oregel 9/22/202212:28 PM

## 2020-11-16 NOTE — Consult Note (Signed)
[place] Nurse Consult Note: Patient receiving care in [facility]. Reason for Consult: "right foot" Wound type: Patient was being followed by Podiatry (Dr. Keith Rake, DPM) in a previous admission at the beginning of September for gangrene of the right foot, where the subject of right lower extremity amputation was discussed with her. She decided she did not want amputation and today stated, "all things heal in time".  I explained that in fact healing does not always occur, and that the provider did not make the suggestion of amputation lightly, but did so in an effort to prevent further complications such as gangrene and loss of life.  She explained she did not make the decision of not pursuing amputation lightly either.  Today her foot is clearly necrotic with an odor. Pressure Injury POA: Yes/No/NA Measurement: deferred.  It would be highly valuable for a provider with [employer] capabilities, to place photos of the surfaces of the foot in the EMR. Wound bed: Drainage (amount, consistency, odor)  Periwound: Dressing procedure/placement/frequency: Insert betadine/iodine moistened gauze into the wounds on the right foot, secure with kerlix.  Continue the use of the Prevalon heel lift boot to the foot.  It was present today at the time of my assessment.  [place] nurse will not follow at this time.  Please re-consult the [place] team if needed.  Val Riles, RN, MSN, CWOCN, CNS-BC, pager [phone]

## 2020-11-16 NOTE — Progress Notes (Signed)
Improvement in blood pressure with cuff adjustment, pt is tolerating treatment well at this time.

## 2020-11-16 NOTE — Consult Note (Signed)
Pharmacy Antibiotic Note  [name] is a 50 y.o. female admitted on 11/14/2020 with necrotic foot infection on outpatient IV cefepime and Vancomycin until 11/22/20 following recent admission 8/21-9/16/22. Patient w/ ESRD on HF T-Th-Sat. Pharmacy has been consulted for Vancomycin dosing.  Patient with difficulty IV access, antibiotics given in HD previous admission. IV team consulted for peripheral line   Last HD 11/15/20 however PTA TTS schedule  Plan: Osteomyelitis of LE w/ strep anginosus & wound cx isolates of Kleb oxytoca/E.Faecalis; vanc post-HD level on 9/22 0618 was appropriate at 26mcg/mL will continue with current dosing regimen. Continue outpatient cefepime 2 gram Q HD (T-Th-Sat);  pt received 2g x1 on 9/20; next dose 9/22 (Thurs) Patient on Vancomycin 1000 mg q HD T-Th, Sat, with weekly levels Last pre-HD level 11/07/20 = 23; Post-HD level 20 (9/22 0618) Goal pre-HD level 15-25 mcg/mL Plan to continue Antibiotic therapy until 11/22/20 per outpatient regimen/ ID following   Height: 5\' 4"  (162.6 cm) Weight: (!) 150.4 kg (331 lb 9.2 oz) IBW/kg (Calculated) : 54.7  Temp (24hrs), Avg:98.2 F (36.8 C), Min:97.4 F (36.3 C), Max:98.9 F (37.2 C)  Recent Labs  Lab 11/10/20 0538 11/13/20 2330 11/15/20 0846 11/16/20 0618  WBC  --  11.4* 7.8 7.7  CREATININE 6.11* 7.90* 6.72* 7.45*  VANCORANDOM  --   --   --  20     Estimated Creatinine Clearance: 13.4 mL/min (A) (by C-G formula based on SCr of 7.45 mg/dL (H)).    Allergies  Allergen Reactions   Dust Mite Extract Shortness Of Breath and Itching   Mold Extract [Trichophyton] Shortness Of Breath and Itching   Pollen Extract Shortness Of Breath    Itching, shortness of breath, asthma like symptoms   Sulfa Antibiotics Itching   Feraheme [Ferumoxytol] Itching    Uses benadryl so she can get the medicine    Antimicrobials this admission: 9/20 cefepime >>  9/20 vancomycin >>   Dose adjustments this  admission: N/a  Microbiology results: 9/22 MRSA pcr: negative 9/1 aerobic/anaerobic foot Cx:  RARE STREPTOCOCCUS ANGINOSIS  8/23 Wound Cx: ENTEROCOCCUS FAECALIS, KLEBSIELLA OXYTOCA    Thank you for allowing pharmacy to be a part of this patient's care.  Lorna Dibble, PharmD, BCPS Clinical Pharmacist   11/16/2020 7:29 AM

## 2020-11-16 NOTE — Discharge Summary (Addendum)
Physician Discharge Summary  [name] VHQ:[id number] DOB: Apr 04, 1970 DOA: 11/14/2020  PCP: [name], NP  Admit date: 11/14/2020 Discharge date: 11/16/2020  Admitted From: Home Disposition:  Home  Recommendations for Outpatient Follow-up:  Follow up with PCP in 1 week  Discharge Condition: Stable CODE STATUS: Full  Diet recommendation: Renal   Brief/Interim Summary: [name] is a 50 y.o. female with medical history significant for diabetes mellitus with complications of ESRD on dialysis (T/TH/S), chronic diastolic dysfunction CHF, hypertension, morbid obesity, obesity hypoventilation syndrome on oxygen supplementation 3 to 4 L, peripheral vascular disease status post amputation of the fifth right digit as well as I&D of abscess on the right foot, depression and anxiety who was recently discharged from the hospital on 11/10/20 and presents today for evaluation of dizziness and shortness of breath.   Patient was hospitalized 10/15/2020 to 11/10/2020 secondary to gangrene of the right foot with osteomyelitis status post fifth ray amputation and I&D on 8/27 as well as washout procedure 9/1.  She was recommended for IV vancomycin, cefepime for 4 weeks, end date 9/28.  She had declined amputation at that time.   Patient states that she was in her usual state of health following her discharge and had gone to dialysis 2 days prior to this hospitalization where she was found to be about 20lbs above her dry weight.  They were able to pull 6L of fluid during her dialysis. Over the last 24 hours she states that she has felt very dizzy and lightheaded and has had worsening shortness of breath from her baseline.  She checked her blood pressure at home and had a systolic blood pressure in the 90s so she took 1 midodrine. Chest x-ray shows pulmonary vascular congestion.  Significant increase in volume of right pleural effusion.  [name] volume status was managed with hemodialysis during  hospitalization.  Echocardiogram was repeated which revealed grade 2 diastolic dysfunction.  Discharge Diagnoses:  Principal Problem:   Acute on chronic diastolic CHF (congestive heart failure) (HCC) Active Problems:   Diabetes mellitus with ESRD (end-stage renal disease) (HCC)   Obesity, Class III, BMI 40-49.9 (morbid obesity) ([place])   Acute on chronic respiratory failure (HCC)   Extreme obesity with alveolar hypoventilation (HCC)   PVD (peripheral vascular disease) (HCC)   Hyponatremia   Anemia of chronic disease     Acute on chronic diastolic CHF exacerbation -Symptoms of SOB, up 20lbs from her dry weight per her report  -Echo on 11/16/20 showed EF 41-32%, grade 2 diastolic dysfunction  -Chest x-ray showed right pleural effusion, pulmonary vascular congestion -Strict I/O's, daily weights, fluid restriction diet -Fluid management with HD   ESRD on HD  -Nephrology following   Acute on chronic hypoxemic respiratory failure -Secondary to above -At baseline patient wears 3 to 4 L of nasal cannula O2 -Currently requiring 4L   Diabetic foot infection, osteomyelitis -Status post amputation of fifth ray and I&D on 8/27 -Washout procedure on 10/26/2020 -Recommended amputation which patient has declined  -Continue IV vancomycin and cefepime with dialysis -Duration of antibiotic therapy is 4 week. Antibiotic end date 9/28 -Daily dressing change -Nonweight bearing right foot    DM with renal complication -Continue semglee, SSI    PAD -Aspirin   Demand ischemia -Insetting of ESRD and volume overload  Discharge Instructions  Discharge Instructions     (HEART FAILURE PATIENTS) Call MD:  Anytime you have any of the following symptoms: 1) 3 pound weight gain in 24 hours or  5 pounds in 1 week 2) shortness of breath, with or without a dry hacking cough 3) swelling in the hands, feet or stomach 4) if you have to sleep on extra pillows at night in order to breathe.   Complete by: As  directed    Call MD for:  difficulty breathing, headache or visual disturbances   Complete by: As directed    Call MD for:  extreme fatigue   Complete by: As directed    Call MD for:  persistant dizziness or light-headedness   Complete by: As directed    Call MD for:  persistant nausea and vomiting   Complete by: As directed    Call MD for:  severe uncontrolled pain   Complete by: As directed    Call MD for:  temperature >100.4   Complete by: As directed    Discharge instructions   Complete by: As directed    You were cared for by a hospitalist during your hospital stay. If you have any questions about your discharge medications or the care you received while you were in the hospital after you are discharged, you can call the unit and ask to speak with the hospitalist on call if the hospitalist that took care of you is not available. Once you are discharged, your primary care physician will handle any further medical issues. Please note that NO REFILLS for any discharge medications will be authorized once you are discharged, as it is imperative that you return to your primary care physician (or establish a relationship with a primary care physician if you do not have one) for your aftercare needs so that they can reassess your need for medications and monitor your lab values.   Discharge wound care:   Complete by: As directed    Insert betadine/iodine moistened gauze into the wounds on the right foot, secure with kerlix.   Increase activity slowly   Complete by: As directed       Allergies as of 11/16/2020       Reactions   Dust Mite Extract Shortness Of Breath, Itching   Mold Extract [trichophyton] Shortness Of Breath, Itching   Pollen Extract Shortness Of Breath   Itching, shortness of breath, asthma like symptoms   Sulfa Antibiotics Itching   Feraheme [ferumoxytol] Itching   Uses benadryl so she can get the medicine        Medication List     STOP taking these medications     ELDERBERRY PO       TAKE these medications    acetaminophen 500 MG tablet Commonly known as: TYLENOL Take 1,000 mg by mouth every 6 (six) hours as needed for mild pain, fever or headache.   albuterol 108 (90 Base) MCG/ACT inhaler Commonly known as: VENTOLIN HFA Inhale 2 puffs into the lungs every 4 (four) hours as needed for wheezing or shortness of breath.   albuterol (2.5 MG/3ML) 0.083% nebulizer solution Commonly known as: PROVENTIL Take 3 mLs (2.5 mg total) by nebulization every 6 (six) hours as needed for wheezing or shortness of breath.   aspirin EC 81 MG tablet Take 81 mg by mouth daily.   b complex-vitamin c-folic acid 0.8 MG Tabs tablet Take 1 tablet by mouth at bedtime.   ceFEPime  IVPB Commonly known as: MAXIPIME Inject 2 g into the vein Every Tuesday,Thursday,and Saturday with dialysis for 11 days. To be given at HD center Indication:  necrotic diabetic foot infection Last Day of Therapy:  11/22/2020 Pre-HD vancomycin  level 15-58mcg/ml or as otherwise directed per HD center protocol Labs weekly while on IV antibiotics: CBC w/ differential, CMP Fax weekly labs to  Dr. Donney Dice) [phone]   Cholecalciferol 100 MCG (4000 UT) Caps Take 1 capsule by mouth once a week.   clindamycin 1 % lotion Commonly known as: CLEOCIN T Apply 1 application topically daily.   docusate sodium 100 MG capsule Commonly known as: COLACE Take 100 mg by mouth daily.   ferric citrate 1 GM 210 MG(Fe) tablet Commonly known as: AURYXIA Take 210 mg by mouth 3 (three) times daily with meals.   insulin aspart 100 UNIT/ML injection Commonly known as: novoLOG Inject 0-5 Units into the skin at bedtime.   insulin aspart 100 UNIT/ML injection Commonly known as: novoLOG Inject 0-15 Units into the skin 3 (three) times daily with meals.   insulin degludec 100 UNIT/ML FlexTouch Pen Commonly known as: TRESIBA Inject 43 Units into the skin daily at 10 pm.   ipratropium-albuterol  0.5-2.5 (3) MG/3ML Soln Commonly known as: DUONEB Take 3 mLs by nebulization every 6 (six) hours as needed.   lactulose 10 GM/15ML solution Commonly known as: CHRONULAC Take 30 mLs (20 g total) by mouth daily as needed for severe constipation.   levocetirizine 5 MG tablet Commonly known as: XYZAL Take 5 mg by mouth at bedtime.   lidocaine-prilocaine cream Commonly known as: EMLA Apply topically as needed (For access for dialysis).   Linzess 290 MCG Caps capsule Generic drug: linaclotide Take 290 mcg by mouth every morning.   metoprolol tartrate 25 MG tablet Commonly known as: LOPRESSOR Take 1 tablet (25 mg total) by mouth 2 (two) times daily.   midodrine 10 MG tablet Commonly known as: PROAMATINE Take 20 mg by mouth daily as needed.   omeprazole 20 MG capsule Commonly known as: PRILOSEC Take 20 mg by mouth daily.   oxyCODONE-acetaminophen 5-325 MG tablet Commonly known as: Percocet Take 1 tablet by mouth every 4 (four) hours as needed for severe pain.   OXYGEN Inhale 2-4 L into the lungs.   Trelegy Ellipta 200-62.5-25 MCG/INH Aepb Generic drug: Fluticasone-Umeclidin-Vilant INHALE 1 PUFF BY MOUTH ONCE DAILY. APPOINTMENT NEEDED FOR FURTHER REFILLS.   vancomycin  IVPB Inject 1,000 mg into the vein Every Tuesday,Thursday,and Saturday with dialysis for 20 days. Indication:  necrotic diabetic foot infection To be given at HD center Last Day of Therapy:  11/22/2020 Pre-HD vancomycin level 15-31mcg/ml or as otherwise directed per HD center protocol Labs weekly while on IV antibiotics: CBC w/ differential, CMP Fax weekly labs to  Dr>[phone]               Discharge Care Instructions  (From admission, onward)           Start     Ordered   11/16/20 0000  Discharge wound care:       Comments: Insert betadine/iodine moistened gauze into the wounds on the right foot, secure with kerlix.   11/16/20 1340            Follow-up Information      Boswell, Ardis Rowan, NP Follow up.   Specialty: Nurse Practitioner Contact information: [place] [address] [phone]                Allergies  Allergen Reactions   Dust Mite Extract Shortness Of Breath and Itching   Mold Extract [Trichophyton] Shortness Of Breath and Itching   Pollen Extract Shortness Of Breath    Itching, shortness of  breath, asthma like symptoms   Sulfa Antibiotics Itching   Feraheme [Ferumoxytol] Itching    Uses benadryl so she can get the medicine    Consultations: Nephrology    Procedures/Studies: DG Chest 2 View  Result Date: 11/14/2020 CLINICAL DATA:  Shortness of breath. EXAM: CHEST - 2 VIEW COMPARISON:  11/09/2020 FINDINGS: Stable cardiomediastinal contours. There has been significant interval increase in volume of right pleural effusion. Similar pulmonary vascular congestion. Mild atelectasis noted in the left lower lobe. IMPRESSION: 1. Significant increase in volume of right pleural effusion. 2. Pulmonary vascular congestion. Electronically Signed   By: Kerby Moors M.D.   On: 11/14/2020 09:06   PERIPHERAL VASCULAR CATHETERIZATION  Result Date: 10/18/2020 See surgical note for result.  DG Chest Port 1 View  Result Date: 11/09/2020 CLINICAL DATA:  Short of breath EXAM: PORTABLE CHEST 1 VIEW COMPARISON:  10/22/2020 FINDINGS: Single frontal view of the chest demonstrates a stable cardiac silhouette. There is chronic central vascular congestion, with persistent veiling opacity at the right lung base consistent with effusion and consolidation. No pneumothorax. IMPRESSION: 1. Right basilar consolidation and effusion. 2. Central vascular congestion. Electronically Signed   By: Randa Ngo M.D.   On: 11/09/2020 20:20   DG Chest Port 1 View  Result Date: 10/22/2020 CLINICAL DATA:  Dyspnea, cough. EXAM: PORTABLE CHEST 1 VIEW COMPARISON:  October 15, 2020. FINDINGS: The heart size and mediastinal contours are within normal  limits. No pneumothorax is noted. Mild left lingular subsegmental atelectasis is noted. Right middle lobe opacity is noted concerning for atelectasis or pneumonia and possible right pleural effusion. The visualized skeletal structures are unremarkable. IMPRESSION: Right middle lobe opacity is noted concerning for atelectasis or pneumonia and possible right pleural effusion. Electronically Signed   By: Marijo Conception M.D.   On: 10/22/2020 11:33   DG Foot Complete Right  Result Date: 10/21/2020 CLINICAL DATA:  Status post fifth metatarsal amputation. EXAM: RIGHT FOOT COMPLETE - 3+ VIEW COMPARISON:  None. FINDINGS: The patient is status post amputation of the distal fifth metatarsal and the fifth toe. Overlying dressings limit evaluation of fine cortical detail in these regions. No other acute abnormalities. Vascular calcifications are identified. IMPRESSION: Patient is status post amputation of the distal fifth metatarsal and the fifth toe. Electronically Signed   By: Dorise Bullion III M.D.   On: 10/21/2020 17:00   ECHOCARDIOGRAM COMPLETE  Result Date: 11/16/2020    ECHOCARDIOGRAM REPORT   Patient Name:   [name] Date of Exam: 11/15/2020 Medical Rec #:  [id number]       Height:       64.0 in Accession #:    [id number]      Weight:       323.0 lb Date of Birth:  [birth year]        BSA:          2.398 m Patient Age:    75 years        BP:           159/70 mmHg Patient Gender: F               HR:           86 bpm. Exam Location:  [facility] Procedure: 2D Echo, Cardiac Doppler, Color Doppler and Intracardiac            Opacification Agent Indications:     H84.69 Acute diastolic CHF  History:         Patient has prior  history of Echocardiogram examinations, most                  recent 11/12/2016. CHF, Signs/Symptoms:Dyspnea; Risk                  Factors:Hypertension and Morbid Obesity. Anxiety. Chronic                  kidney disease-Dialysis. Pericardial effusion. Pneumonia.  Sonographer:     Cresenciano Lick RDCS Referring Phys:  [id number] Anderson [place] Kaisyn Reinhold Diagnosing Phys: Kate Sable MD  Sonographer Comments: Technically difficult study due to poor echo windows. IMPRESSIONS  1. Left ventricular ejection fraction, by estimation, is 60 to 65%. The left ventricle has normal function. The left ventricle has no regional wall motion abnormalities. There is mild left ventricular hypertrophy. Left ventricular diastolic parameters are consistent with Grade II diastolic dysfunction (pseudonormalization).  2. Right ventricular systolic function is normal. The right ventricular size is not well visualized. There is severely elevated pulmonary artery systolic pressure. The estimated right ventricular systolic pressure is 64.3 mmHg.  3. The mitral valve is degenerative. Mild mitral valve regurgitation.  4. The aortic valve is calcified. Aortic valve regurgitation is not visualized. Mild to moderate aortic valve sclerosis/calcification is present, without any evidence of aortic stenosis.  5. The inferior vena cava is dilated in size with <50% respiratory variability, suggesting right atrial pressure of 15 mmHg. FINDINGS  Left Ventricle: Left ventricular ejection fraction, by estimation, is 60 to 65%. The left ventricle has normal function. The left ventricle has no regional wall motion abnormalities. Definity contrast agent was given IV to delineate the left ventricular  endocardial borders. The left ventricular internal cavity size was normal in size. There is mild left ventricular hypertrophy. Left ventricular diastolic parameters are consistent with Grade II diastolic dysfunction (pseudonormalization). Right Ventricle: The right ventricular size is not well visualized. No increase in right ventricular wall thickness. Right ventricular systolic function is normal. There is severely elevated pulmonary artery systolic pressure. The tricuspid regurgitant velocity is 3.98 m/s, and with an assumed right atrial pressure of  15 mmHg, the estimated right ventricular systolic pressure is 32.9 mmHg. Left Atrium: Left atrial size was normal in size. Right Atrium: Right atrial size was normal in size. Pericardium: There is no evidence of pericardial effusion. Mitral Valve: The mitral valve is degenerative in appearance. There is severe calcification of the mitral valve leaflet(s). Mild mitral annular calcification. Mild mitral valve regurgitation. Tricuspid Valve: The tricuspid valve is not well visualized. Tricuspid valve regurgitation is mild. Aortic Valve: The aortic valve is calcified. Aortic valve regurgitation is not visualized. Mild to moderate aortic valve sclerosis/calcification is present, without any evidence of aortic stenosis. Pulmonic Valve: The pulmonic valve was not well visualized. Pulmonic valve regurgitation is not visualized. Aorta: The aortic root is normal in size and structure. Venous: The inferior vena cava is dilated in size with less than 50% respiratory variability, suggesting right atrial pressure of 15 mmHg. IAS/Shunts: No atrial level shunt detected by color flow Doppler.  LEFT VENTRICLE PLAX 2D LVIDd:         4.40 cm Diastology LVIDs:         3.00 cm LV e' medial:    4.46 cm/s LV PW:         1.30 cm LV E/e' medial:  26.5 LV IVS:        1.30 cm LV e' lateral:   4.57 cm/s  LV E/e' lateral: 25.8  RIGHT VENTRICLE             IVC RV Basal diam:  4.80 cm     IVC diam: 2.50 cm RV S prime:     12.00 cm/s TAPSE (M-mode): 2.3 cm LEFT ATRIUM             Index       RIGHT ATRIUM           Index LA diam:        4.90 cm 2.04 cm/m  RA Area:     17.30 cm LA Vol (A2C):   69.9 ml 29.14 ml/m RA Volume:   47.90 ml  19.97 ml/m LA Vol (A4C):   62.6 ml 26.10 ml/m LA Biplane Vol: 67.0 ml 27.94 ml/m   AORTA Ao Root diam: 2.90 cm MITRAL VALVE                TRICUSPID VALVE MV Area (PHT): 2.62 cm     TR Peak grad:   63.4 mmHg MV Decel Time: 289 msec     TR Vmax:        398.00 cm/s MV E velocity: 118.00 cm/s  MV A velocity: 169.00 cm/s MV E/A ratio:  0.70 Kate Sable MD Electronically signed by Kate Sable MD Signature Date/Time: 11/16/2020/1:29:51 PM    Final        Discharge Exam: Vitals:   11/16/20 1245 11/16/20 1307  BP: 128/65 138/64  Pulse:  91  Resp: 19   Temp:  98 F (36.7 C)  SpO2: 100% 100%    General: Pt is alert, awake, not in acute distress Cardiovascular: RRR, S1/S2 +, no edema Respiratory: CTA bilaterally, no wheezing, no rhonchi, no respiratory distress, no conversational dyspnea, on 4L O2 which is her baseline  Abdominal: Soft, NT, ND, bowel sounds + Extremities: +right foot in prevalon heel lift boot  Psych: Normal mood and affect, stable judgement and insight     The results of significant diagnostics from this hospitalization (including imaging, microbiology, ancillary and laboratory) are listed below for reference.     Microbiology: Recent Results (from the past 240 hour(s))  Resp Panel by RT-PCR (Flu A&B, Covid) Nasopharyngeal Swab     Status: None   Collection Time: 11/14/20  3:45 PM   Specimen: Nasopharyngeal Swab; Nasopharyngeal(NP) swabs in vial transport medium  Result Value Ref Range Status   SARS Coronavirus 2 by RT PCR NEGATIVE NEGATIVE Final    Comment: (NOTE) SARS-CoV-2 target nucleic acids are NOT DETECTED.  The SARS-CoV-2 RNA is generally detectable in upper respiratory specimens during the acute phase of infection. The lowest concentration of SARS-CoV-2 viral copies this assay can detect is 138 copies/mL. A negative result does not preclude SARS-Cov-2 infection and should not be used as the sole basis for treatment or other patient management decisions. A negative result may occur with  improper specimen collection/handling, submission of specimen other than nasopharyngeal swab, presence of viral mutation(s) within the areas targeted by this assay, and inadequate number of viral copies(<138 copies/mL). A negative result must be  combined with clinical observations, patient history, and epidemiological information. The expected result is Negative.  Fact Sheet for Patients:  EntrepreneurPulse.com.au  Fact Sheet for Healthcare Providers:  IncredibleEmployment.be  This test is no t yet approved or cleared by the [place] FDA and  has been authorized for detection and/or diagnosis of SARS-CoV-2 by FDA under an Emergency Use Authorization (EUA). This EUA will remain  in  effect (meaning this test can be used) for the duration of the COVID-19 declaration under Section 564(b)(1) of the Act, 21 U.S.C.section 360bbb-3(b)(1), unless the authorization is terminated  or revoked sooner.       Influenza A by PCR NEGATIVE NEGATIVE Final   Influenza B by PCR NEGATIVE NEGATIVE Final    Comment: (NOTE) The Xpert Xpress SARS-CoV-2/FLU/RSV plus assay is intended as an aid in the diagnosis of influenza from Nasopharyngeal swab specimens and should not be used as a sole basis for treatment. Nasal washings and aspirates are unacceptable for Xpert Xpress SARS-CoV-2/FLU/RSV testing.  Fact Sheet for Patients: EntrepreneurPulse.com.au  Fact Sheet for Healthcare Providers: IncredibleEmployment.be  This test is not yet approved or cleared by the [place] FDA and has been authorized for detection and/or diagnosis of SARS-CoV-2 by FDA under an Emergency Use Authorization (EUA). This EUA will remain in effect (meaning this test can be used) for the duration of the COVID-19 declaration under Section 564(b)(1) of the Act, 21 U.S.C. section 360bbb-3(b)(1), unless the authorization is terminated or revoked.  Performed at [facility], [address] [place], [place] [address]   MRSA Next Gen by PCR, Nasal     Status: None   Collection Time: 11/16/20 12:15 AM   Specimen: Nasal Mucosa; Nasal Swab  Result Value Ref Range Status   MRSA by PCR  Next Gen NOT DETECTED NOT DETECTED Final    Comment: (NOTE) The GeneXpert MRSA Assay (FDA approved for NASAL specimens only), is one component of a comprehensive MRSA colonization surveillance program. It is not intended to diagnose MRSA infection nor to guide or monitor treatment for MRSA infections. Test performance is not FDA approved in patients less than 66 years old. Performed at [facility], [address] [place], [place] [address]      Labs: BNP (last 3 results) No results for input(s): BNP in the last 8760 hours. Basic Metabolic Panel: Recent Labs  Lab 11/10/20 0538 11/13/20 2330 11/15/20 0846 11/16/20 0618  NA 127* 131* 133* 129*  K 4.2 4.5 4.6 4.6  CL 91* 92* 91* 89*  CO2 28 25 28 24   GLUCOSE 78 128* 154* 160*  BUN 43* 57* 45* 58*  CREATININE 6.11* 7.90* 6.72* 7.45*  CALCIUM 7.3* 7.4* 7.8* 7.8*   Liver Function Tests: No results for input(s): AST, ALT, ALKPHOS, BILITOT, PROT, ALBUMIN in the last 168 hours. No results for input(s): LIPASE, AMYLASE in the last 168 hours. No results for input(s): AMMONIA in the last 168 hours. CBC: Recent Labs  Lab 11/13/20 2330 11/15/20 0846 11/16/20 0618  WBC 11.4* 7.8 7.7  HGB 8.3* 8.7* 9.0*  HCT 26.8* 28.3* 30.0*  MCV 97.5 99.0 98.4  PLT 405* 326 328   Cardiac Enzymes: No results for input(s): CKTOTAL, CKMB, CKMBINDEX, TROPONINI in the last 168 hours. BNP: Invalid input(s): POCBNP CBG: Recent Labs  Lab 11/15/20 0818 11/15/20 1412 11/15/20 1633 11/15/20 2126 11/16/20 0908  GLUCAP 150* 153* 215* 194* 146*   D-Dimer No results for input(s): DDIMER in the last 72 hours. Hgb A1c No results for input(s): HGBA1C in the last 72 hours. Lipid Profile No results for input(s): CHOL, HDL, LDLCALC, TRIG, CHOLHDL, LDLDIRECT in the last 72 hours. Thyroid function studies No results for input(s): TSH, T4TOTAL, T3FREE, THYROIDAB in the last 72 hours.  Invalid input(s): FREET3 Anemia work up No results for  input(s): VITAMINB12, FOLATE, FERRITIN, TIBC, IRON, RETICCTPCT in the last 72 hours. Urinalysis    Component Value Date/Time  COLORURINE YELLOW (A) 04/02/2017 1548   APPEARANCEUR HAZY (A) 04/02/2017 1548   LABSPEC 1.008 04/02/2017 1548   PHURINE 5.0 04/02/2017 1548   GLUCOSEU NEGATIVE 04/02/2017 1548   HGBUR NEGATIVE 04/02/2017 [place] 04/02/2017 [place] 04/02/2017 1548   PROTEINUR 100 (A) 04/02/2017 1548   NITRITE NEGATIVE 04/02/2017 1548   LEUKOCYTESUR NEGATIVE 04/02/2017 1548   Sepsis Labs Invalid input(s): PROCALCITONIN,  WBC,  LACTICIDVEN Microbiology Recent Results (from the past 240 hour(s))  Resp Panel by RT-PCR (Flu A&B, Covid) Nasopharyngeal Swab     Status: None   Collection Time: 11/14/20  3:45 PM   Specimen: Nasopharyngeal Swab; Nasopharyngeal(NP) swabs in vial transport medium  Result Value Ref Range Status   SARS Coronavirus 2 by RT PCR NEGATIVE NEGATIVE Final    Comment: (NOTE) SARS-CoV-2 target nucleic acids are NOT DETECTED.  The SARS-CoV-2 RNA is generally detectable in upper respiratory specimens during the acute phase of infection. The lowest concentration of SARS-CoV-2 viral copies this assay can detect is 138 copies/mL. A negative result does not preclude SARS-Cov-2 infection and should not be used as the sole basis for treatment or other patient management decisions. A negative result may occur with  improper specimen collection/handling, submission of specimen other than nasopharyngeal swab, presence of viral mutation(s) within the areas targeted by this assay, and inadequate number of viral copies(<138 copies/mL). A negative result must be combined with clinical observations, patient history, and epidemiological information. The expected result is Negative.  Fact Sheet for Patients:  EntrepreneurPulse.com.au  Fact Sheet for Healthcare Providers:   IncredibleEmployment.be  This test is no t yet approved or cleared by the [place] FDA and  has been authorized for detection and/or diagnosis of SARS-CoV-2 by FDA under an Emergency Use Authorization (EUA). This EUA will remain  in effect (meaning this test can be used) for the duration of the COVID-19 declaration under Section 564(b)(1) of the Act, 21 U.S.C.section 360bbb-3(b)(1), unless the authorization is terminated  or revoked sooner.       Influenza A by PCR NEGATIVE NEGATIVE Final   Influenza B by PCR NEGATIVE NEGATIVE Final    Comment: (NOTE) The Xpert Xpress SARS-CoV-2/FLU/RSV plus assay is intended as an aid in the diagnosis of influenza from Nasopharyngeal swab specimens and should not be used as a sole basis for treatment. Nasal washings and aspirates are unacceptable for Xpert Xpress SARS-CoV-2/FLU/RSV testing.  Fact Sheet for Patients: EntrepreneurPulse.com.au  Fact Sheet for Healthcare Providers: IncredibleEmployment.be  This test is not yet approved or cleared by the [place] FDA and has been authorized for detection and/or diagnosis of SARS-CoV-2 by FDA under an Emergency Use Authorization (EUA). This EUA will remain in effect (meaning this test can be used) for the duration of the COVID-19 declaration under Section 564(b)(1) of the Act, 21 U.S.C. section 360bbb-3(b)(1), unless the authorization is terminated or revoked.  Performed at [facility], [address] [place], [place] [address]   MRSA Next Gen by PCR, Nasal     Status: None   Collection Time: 11/16/20 12:15 AM   Specimen: Nasal Mucosa; Nasal Swab  Result Value Ref Range Status   MRSA by PCR Next Gen NOT DETECTED NOT DETECTED Final    Comment: (NOTE) The GeneXpert MRSA Assay (FDA approved for NASAL specimens only), is one component of a comprehensive MRSA colonization surveillance program. It is not intended to  diagnose MRSA infection nor to guide or monitor treatment for MRSA infections. Test  performance is not FDA approved in patients less than 67 years old. Performed at [facility], [address] [place], [place] [address]      Patient was seen and examined on the day of discharge and was found to be in stable condition. Time coordinating discharge: 25 minutes including assessment and coordination of care, as well as examination of the patient.   SIGNED:  Dessa Phi, DO [facility] 11/16/2020, 1:41 PM

## 2020-11-16 NOTE — Progress Notes (Signed)
OT Cancellation Note  Patient Details Name: [name] MRN: [id number] DOB: [birth year]   Cancelled Treatment:    Reason Eval/Treat Not Completed: Patient at procedure or test/ unavailable Pt OTF to HD at this time. Will f/u for OT evaluation at later date/time as able/pt available. Thank you.  Gerrianne Scale, [place], OTR/L ascom [phone] 11/16/20, 10:13 AM

## 2020-11-17 DIAGNOSIS — E1151 Type 2 diabetes mellitus with diabetic peripheral angiopathy without gangrene: Secondary | ICD-10-CM | POA: Diagnosis not present

## 2020-11-17 DIAGNOSIS — E11621 Type 2 diabetes mellitus with foot ulcer: Secondary | ICD-10-CM | POA: Diagnosis not present

## 2020-11-17 DIAGNOSIS — N186 End stage renal disease: Secondary | ICD-10-CM | POA: Diagnosis not present

## 2020-11-17 DIAGNOSIS — D631 Anemia in chronic kidney disease: Secondary | ICD-10-CM | POA: Diagnosis not present

## 2020-11-17 DIAGNOSIS — E1152 Type 2 diabetes mellitus with diabetic peripheral angiopathy with gangrene: Secondary | ICD-10-CM | POA: Diagnosis not present

## 2020-11-17 DIAGNOSIS — I5032 Chronic diastolic (congestive) heart failure: Secondary | ICD-10-CM | POA: Diagnosis not present

## 2020-11-17 DIAGNOSIS — L97511 Non-pressure chronic ulcer of other part of right foot limited to breakdown of skin: Secondary | ICD-10-CM | POA: Diagnosis not present

## 2020-11-17 DIAGNOSIS — E1122 Type 2 diabetes mellitus with diabetic chronic kidney disease: Secondary | ICD-10-CM | POA: Diagnosis not present

## 2020-11-17 DIAGNOSIS — I132 Hypertensive heart and chronic kidney disease with heart failure and with stage 5 chronic kidney disease, or end stage renal disease: Secondary | ICD-10-CM | POA: Diagnosis not present

## 2020-11-18 DIAGNOSIS — J449 Chronic obstructive pulmonary disease, unspecified: Secondary | ICD-10-CM | POA: Diagnosis not present

## 2020-11-18 DIAGNOSIS — N186 End stage renal disease: Secondary | ICD-10-CM | POA: Diagnosis not present

## 2020-11-18 DIAGNOSIS — Z992 Dependence on renal dialysis: Secondary | ICD-10-CM | POA: Diagnosis not present

## 2020-11-18 DIAGNOSIS — J9611 Chronic respiratory failure with hypoxia: Secondary | ICD-10-CM | POA: Diagnosis not present

## 2020-11-18 DIAGNOSIS — I5032 Chronic diastolic (congestive) heart failure: Secondary | ICD-10-CM | POA: Diagnosis not present

## 2020-11-18 DIAGNOSIS — T8743 Infection of amputation stump, right lower extremity: Secondary | ICD-10-CM | POA: Diagnosis not present

## 2020-11-20 DIAGNOSIS — N186 End stage renal disease: Secondary | ICD-10-CM | POA: Diagnosis not present

## 2020-11-20 DIAGNOSIS — I5032 Chronic diastolic (congestive) heart failure: Secondary | ICD-10-CM | POA: Diagnosis not present

## 2020-11-20 DIAGNOSIS — I132 Hypertensive heart and chronic kidney disease with heart failure and with stage 5 chronic kidney disease, or end stage renal disease: Secondary | ICD-10-CM | POA: Diagnosis not present

## 2020-11-20 DIAGNOSIS — D631 Anemia in chronic kidney disease: Secondary | ICD-10-CM | POA: Diagnosis not present

## 2020-11-20 DIAGNOSIS — E11621 Type 2 diabetes mellitus with foot ulcer: Secondary | ICD-10-CM | POA: Diagnosis not present

## 2020-11-20 DIAGNOSIS — E1152 Type 2 diabetes mellitus with diabetic peripheral angiopathy with gangrene: Secondary | ICD-10-CM | POA: Diagnosis not present

## 2020-11-20 DIAGNOSIS — E1151 Type 2 diabetes mellitus with diabetic peripheral angiopathy without gangrene: Secondary | ICD-10-CM | POA: Diagnosis not present

## 2020-11-20 DIAGNOSIS — E1122 Type 2 diabetes mellitus with diabetic chronic kidney disease: Secondary | ICD-10-CM | POA: Diagnosis not present

## 2020-11-20 DIAGNOSIS — L97511 Non-pressure chronic ulcer of other part of right foot limited to breakdown of skin: Secondary | ICD-10-CM | POA: Diagnosis not present

## 2020-11-21 ENCOUNTER — Other Ambulatory Visit: Payer: Self-pay

## 2020-11-21 ENCOUNTER — Ambulatory Visit: Payer: Medicare HMO | Attending: Infectious Diseases | Admitting: Infectious Diseases

## 2020-11-21 ENCOUNTER — Telehealth: Payer: Self-pay

## 2020-11-21 ENCOUNTER — Other Ambulatory Visit
Admission: RE | Admit: 2020-11-21 | Discharge: 2020-11-21 | Disposition: A | Discharge status: Home / Self Care | Payer: Medicare HMO | Attending: Infectious Diseases | Admitting: Infectious Diseases

## 2020-11-21 VITALS — BP 132/82 | HR 75 | Resp 16 | Ht 64.0 in | Wt 331.6 lb

## 2020-11-21 DIAGNOSIS — Z7982 Long term (current) use of aspirin: Secondary | ICD-10-CM | POA: Insufficient documentation

## 2020-11-21 DIAGNOSIS — Z833 Family history of diabetes mellitus: Secondary | ICD-10-CM | POA: Diagnosis not present

## 2020-11-21 DIAGNOSIS — Z79899 Other long term (current) drug therapy: Secondary | ICD-10-CM | POA: Diagnosis not present

## 2020-11-21 DIAGNOSIS — I5032 Chronic diastolic (congestive) heart failure: Secondary | ICD-10-CM | POA: Insufficient documentation

## 2020-11-21 DIAGNOSIS — Z882 Allergy status to sulfonamides status: Secondary | ICD-10-CM | POA: Diagnosis not present

## 2020-11-21 DIAGNOSIS — B999 Unspecified infectious disease: Secondary | ICD-10-CM | POA: Diagnosis not present

## 2020-11-21 DIAGNOSIS — Z992 Dependence on renal dialysis: Secondary | ICD-10-CM | POA: Insufficient documentation

## 2020-11-21 DIAGNOSIS — I132 Hypertensive heart and chronic kidney disease with heart failure and with stage 5 chronic kidney disease, or end stage renal disease: Secondary | ICD-10-CM | POA: Diagnosis not present

## 2020-11-21 DIAGNOSIS — Z794 Long term (current) use of insulin: Secondary | ICD-10-CM | POA: Diagnosis not present

## 2020-11-21 DIAGNOSIS — E1122 Type 2 diabetes mellitus with diabetic chronic kidney disease: Secondary | ICD-10-CM | POA: Diagnosis not present

## 2020-11-21 DIAGNOSIS — Z7901 Long term (current) use of anticoagulants: Secondary | ICD-10-CM | POA: Diagnosis not present

## 2020-11-21 DIAGNOSIS — N186 End stage renal disease: Secondary | ICD-10-CM | POA: Insufficient documentation

## 2020-11-21 DIAGNOSIS — L089 Local infection of the skin and subcutaneous tissue, unspecified: Secondary | ICD-10-CM | POA: Insufficient documentation

## 2020-11-21 DIAGNOSIS — E1169 Type 2 diabetes mellitus with other specified complication: Secondary | ICD-10-CM | POA: Diagnosis not present

## 2020-11-21 DIAGNOSIS — Z9981 Dependence on supplemental oxygen: Secondary | ICD-10-CM | POA: Insufficient documentation

## 2020-11-21 DIAGNOSIS — T8743 Infection of amputation stump, right lower extremity: Secondary | ICD-10-CM | POA: Diagnosis not present

## 2020-11-21 LAB — CBC WITH DIFFERENTIAL/PLATELET
Abs Immature Granulocytes: 0.06 10*3/uL (ref 0.00–0.07)
Basophils Absolute: 0.1 10*3/uL (ref 0.0–0.1)
Basophils Relative: 1 %
Eosinophils Absolute: 0.2 10*3/uL (ref 0.0–0.5)
Eosinophils Relative: 2 %
HCT: 31.8 % — ABNORMAL LOW (ref 36.0–46.0)
Hemoglobin: 9.8 g/dL — ABNORMAL LOW (ref 12.0–15.0)
Immature Granulocytes: 1 %
Lymphocytes Relative: 10 %
Lymphs Abs: 0.8 10*3/uL (ref 0.7–4.0)
MCH: 29.8 pg (ref 26.0–34.0)
MCHC: 30.8 g/dL (ref 30.0–36.0)
MCV: 96.7 fL (ref 80.0–100.0)
Monocytes Absolute: 0.6 10*3/uL (ref 0.1–1.0)
Monocytes Relative: 7 %
Neutro Abs: 6.3 10*3/uL (ref 1.7–7.7)
Neutrophils Relative %: 79 %
Platelets: 306 10*3/uL (ref 150–400)
RBC: 3.29 MIL/uL — ABNORMAL LOW (ref 3.87–5.11)
RDW: 14.4 % (ref 11.5–15.5)
WBC: 8 10*3/uL (ref 4.0–10.5)
nRBC: 0 % (ref 0.0–0.2)

## 2020-11-21 LAB — COMPREHENSIVE METABOLIC PANEL
ALT: 19 U/L (ref 0–44)
AST: 25 U/L (ref 15–41)
Albumin: 3.4 g/dL — ABNORMAL LOW (ref 3.5–5.0)
Alkaline Phosphatase: 347 U/L — ABNORMAL HIGH (ref 38–126)
Anion gap: 12 (ref 5–15)
BUN: 24 mg/dL — ABNORMAL HIGH (ref 6–20)
CO2: 28 mmol/L (ref 22–32)
Calcium: 9 mg/dL (ref 8.9–10.3)
Chloride: 96 mmol/L — ABNORMAL LOW (ref 98–111)
Creatinine, Ser: 5.04 mg/dL — ABNORMAL HIGH (ref 0.44–1.00)
GFR, Estimated: 10 mL/min — ABNORMAL LOW (ref >60)
Glucose, Bld: 85 mg/dL (ref 70–99)
Potassium: 4.1 mmol/L (ref 3.5–5.1)
Sodium: 136 mmol/L (ref 135–145)
Total Bilirubin: 1.1 mg/dL (ref 0.3–1.2)
Total Protein: 8.2 g/dL — ABNORMAL HIGH (ref 6.5–8.1)

## 2020-11-21 LAB — C-REACTIVE PROTEIN: CRP: 3.1 mg/dL — ABNORMAL HIGH (ref <1.0)

## 2020-11-21 LAB — SEDIMENTATION RATE: Sed Rate: 92 mm/hr — ABNORMAL HIGH (ref 0–20)

## 2020-11-21 NOTE — Telephone Encounter (Signed)
Faxed ov notes with OPAT orders and lab orders to [facility]) and it went through at 4:22 pm 11/21/20.

## 2020-11-21 NOTE — Progress Notes (Signed)
NAME: [name]  DOB: [birth year]  MRN: [id number]  Date/Time: 11/21/2020 12:40 PM    ? [name] is a 50 y.o. with a history of DM, DFI, ESRD on dialysis , CHF, HTN,  Recently was in hospital for rt foot infection Diabetic foot infection- necrotic with osteomyelitis- amputation of fifth ray and I&D on 8/27 -[place] procedure on 10/26/2020 -Recommended amputation which patient had declined  -Sent on  IV vancomycin and cefepime with dialysis -Duration of antibiotic therapy was  4 week. Antibiotic end date 9/28 Pt is here for follow up  Pt is doing well Says the foot is coming along She has not seen podiatrist after discharge She had to be readmitted with SOB and was fluid overload and says they took out 50 pounds of fluid thru dialysis She is breathing better Appetite is improving The nurse comes home to do the dressing and her cousin helps as well  Past Medical History:  Diagnosis Date   (HFpEF) heart failure with preserved ejection fraction ([place])    a. 11/2014 Echo: EF 50-55%, Gr1 DD; b. 06/2016 Echo: EF 60-65%, no rwma, mod dil LA, nl RV, triv eff.   Anxiety    Asthma    Bronchitis    CHF (congestive heart failure) (HCC)    Chronic cough    Chronic respiratory failure with hypoxia and hypercapnia ([place])    a. Home O2.  (2-4L)   CKD (chronic kidney disease), stage III (HCC)    Depression    Diabetes mellitus without complication (HCC)    Diverticulosis    Dyspnea    Dysrhythmia    per pt, no arrythmias   Environmental and seasonal allergies    Extreme obesity with alveolar hypoventilation (HCC)    Hypertension    Iron deficiency anemia    a. chronic blood loss->heavy menses.   Kidney failure    dialysis tues/thur/sat   ... left arm shunt   Orthopnea    Oxygen dependent    3l/min   Pericardial effusion    a. 11/2014 CT Chest: Mod effusion; b. 11/2014 Echo: small to mod circumferential effusion. No hemodynamic compromise.   Pneumonia    history   Poorly  controlled diabetes mellitus ([place])    a. 11/2014 A1c 13.2.   Swelling of both lower extremities     Past Surgical History:  Procedure Laterality Date   A/V FISTULAGRAM Left 06/09/2017   Procedure: A/V FISTULAGRAM;  Surgeon: Algernon Huxley, MD;  Location: [place] [facility];  Service: Cardiovascular;  Laterality: Left;   A/V FISTULAGRAM Left 06/16/2017   Procedure: A/V FISTULAGRAM;  Surgeon: Algernon Huxley, MD;  Location: [place] [facility];  Service: Cardiovascular;  Laterality: Left;   A/V FISTULAGRAM Left 01/28/2018   Procedure: A/V FISTULAGRAM;  Surgeon: Algernon Huxley, MD;  Location: [facility] [facility];  Service: Cardiovascular;  Laterality: Left;   AMPUTATION TOE Right 10/21/2020   Procedure: AMPUTATION TOE-5th Digit Amputation Right Foot;  Surgeon: Edrick Kins, DPM;  Location: [facility];  Service: Podiatry;  Laterality: Right;   AV FISTULA PLACEMENT Left 04/25/2017   Procedure: ARTERIOVENOUS (AV) FISTULA CREATION;  Surgeon: Algernon Huxley, MD;  Location: [facility];  Service: Vascular;  Laterality: Left;   CATARACT EXTRACTION W/PHACO Left 01/21/2018   Procedure: CATARACT EXTRACTION PHACO AND INTRAOCULAR LENS PLACEMENT (IOC);  Surgeon: Leandrew Koyanagi, MD;  Location: [facility];  Service: Ophthalmology;  Laterality: Left;  [place] 01:55.5 CDE 9.05 EAUP 13.9 Fluid Pack Lot #  [id number]   CESAREAN SECTION     times 3   DIALYSIS/PERMA CATHETER INSERTION Right 04/04/2017   Procedure: DIALYSIS/PERMA CATHETER INSERTION;  Surgeon: Katha Cabal, MD;  Location: [facility] [facility];  Service: Cardiovascular;  Laterality: Right;   DIALYSIS/PERMA CATHETER REMOVAL N/A 09/17/2017   Procedure: DIALYSIS/PERMA CATHETER REMOVAL;  Surgeon: Algernon Huxley, MD;  Location: [facility];  Service: Cardiovascular;  Laterality: N/A;   INCISION AND DRAINAGE Right 10/26/2020   Procedure: INCISION AND DRAINAGE;  Surgeon: [name], DPM;  Location: [facility];  Service: Podiatry;  Laterality: Right;   INSERTION  OF DIALYSIS CATHETER N/A 04/16/2017   Procedure: INSERTION OF DIALYSIS PERM CATHETER;  Surgeon: Algernon Huxley, MD;  Location: [facility];  Service: Vascular;  Laterality: N/A;   LOWER EXTREMITY ANGIOGRAPHY Right 10/18/2020   Procedure: Lower Extremity Angiography;  Surgeon: Algernon Huxley, MD;  Location: [facility];  Service: Cardiovascular;  Laterality: Right;    Social History   Socioeconomic History   Marital status: Married    Spouse name: abjul   Number of children: Not on file   Years of education: 12   Highest education level: High school graduate  Occupational History   Occupation: Disabled  Tobacco Use   Smoking status: Never   Smokeless tobacco: Never  Vaping Use   Vaping Use: Never used  Substance and Sexual Activity   Alcohol use: Not Currently    Comment: occasional   Drug use: Yes    Types: Marijuana   Sexual activity: Not Currently    Birth control/protection: None  Other Topics Concern   Not on file  Social History Narrative   Lives in [place] with her husband and 37 y/o child.  Three other children are grown and out of the house.  She is sedentary and does not routinely exercise.  On home O2.   Social Determinants of Health   Financial Resource Strain: Not on file  Food Insecurity: Not on file  Transportation Needs: Not on file  Physical Activity: Not on file  Stress: Not on file  Social Connections: Not on file  Intimate Partner Violence: Not on file    Family History  Problem Relation Age of Onset   Diabetes Mother    Hypertension Mother    Hypertension Father    Diabetes Father    Hypertension Other    Diabetes Other    Breast cancer Maternal Aunt 61   Breast cancer Maternal Aunt 50   Allergies  Allergen Reactions   Dust Mite Extract Shortness Of Breath and Itching   Mold Extract [Trichophyton] Shortness Of Breath and Itching   Pollen Extract Shortness Of Breath    Itching, shortness of breath, asthma like symptoms   Sulfa Antibiotics  Itching   Feraheme [Ferumoxytol] Itching    Uses benadryl so she can get the medicine   I? Current Outpatient Medications  Medication Sig Dispense Refill   acetaminophen (TYLENOL) 500 MG tablet Take 1,000 mg by mouth every 6 (six) hours as needed for mild pain, fever or headache.     albuterol (PROVENTIL) (2.5 MG/3ML) 0.083% nebulizer solution Take 3 mLs (2.5 mg total) by nebulization every 6 (six) hours as needed for wheezing or shortness of breath. 75 mL 12   albuterol (VENTOLIN HFA) 108 (90 Base) MCG/ACT inhaler Inhale 2 puffs into the lungs every 4 (four) hours as needed for wheezing or shortness of breath. 18 g 2   aspirin EC 81 MG  tablet Take 81 mg by mouth daily.      b complex-vitamin c-folic acid (NEPHRO-VITE) 0.8 MG TABS tablet Take 1 tablet by mouth at bedtime.     ceFEPime (MAXIPIME) IVPB Inject 2 g into the vein Every Tuesday,Thursday,and Saturday with dialysis for 11 days. To be given at HD center Indication:  necrotic diabetic foot infection Last Day of Therapy:  11/22/2020 Pre-HD vancomycin level 15-61mg/ml or as otherwise directed per HD center protocol Labs weekly while on IV antibiotics: CBC w/ differential, CMP Fax weekly labs to  Dr. RDonney Dice [phone] Units 0   Cholecalciferol 100 MCG (4000 UT) CAPS Take 1 capsule by mouth once a week.     clindamycin (CLEOCIN T) 1 % lotion Apply 1 application topically daily.     docusate sodium (COLACE) 100 MG capsule Take 100 mg by mouth daily.     ferric citrate (AURYXIA) 1 GM 210 MG(Fe) tablet Take 210 mg by mouth 3 (three) times daily with meals.     insulin aspart (NOVOLOG) 100 UNIT/ML injection Inject 0-5 Units into the skin at bedtime. 10 mL 11   insulin aspart (NOVOLOG) 100 UNIT/ML injection Inject 0-15 Units into the skin 3 (three) times daily with meals. 10 mL 11   insulin degludec (TRESIBA) 100 UNIT/ML FlexTouch Pen Inject 43 Units into the skin daily at 10 pm.     ipratropium-albuterol (DUONEB) 0.5-2.5 (3) MG/3ML  SOLN Take 3 mLs by nebulization every 6 (six) hours as needed. 360 mL 5   lactulose (CHRONULAC) 10 GM/15ML solution Take 30 mLs (20 g total) by mouth daily as needed for severe constipation. 240 mL 0   levocetirizine (XYZAL) 5 MG tablet Take 5 mg by mouth at bedtime.     lidocaine-prilocaine (EMLA) cream Apply topically as needed (For access for dialysis).     LINZESS 290 MCG CAPS capsule Take 290 mcg by mouth every morning.     metoprolol tartrate (LOPRESSOR) 25 MG tablet Take 1 tablet (25 mg total) by mouth 2 (two) times daily. 30 tablet 0   midodrine (PROAMATINE) 10 MG tablet Take 20 mg by mouth daily as needed.     omeprazole (PRILOSEC) 20 MG capsule Take 20 mg by mouth daily.     oxyCODONE-acetaminophen (PERCOCET) 5-325 MG tablet Take 1 tablet by mouth every 4 (four) hours as needed for severe pain. 12 tablet 0   OXYGEN Inhale 2-4 L into the lungs.     TRELEGY ELLIPTA 200-62.5-25 MCG/INH AEPB INHALE 1 PUFF BY MOUTH ONCE DAILY. APPOINTMENT NEEDED FOR FURTHER REFILLS. 60 each 0   vancomycin IVPB Inject 1,000 mg into the vein Every Tuesday,Thursday,and Saturday with dialysis for 20 days. Indication:  necrotic diabetic foot infection To be given at HD center Last Day of Therapy:  11/22/2020 Pre-HD vancomycin level 15-264m/ml or as otherwise directed per HD center protocol Labs weekly while on IV antibiotics: CBC w/ differential, CMP Fax weekly labs to  Dr>Darielys [phone]) [phone] Units 0   No current facility-administered medications for this visit.     Abtx:  Anti-infectives (From admission, onward)    None       REVIEW OF SYSTEMS:  Const: negative fever, negative chills, weight gain Eyes: negative diplopia or visual changes, negative eye pain ENT: negative coryza, negative sore throat Resp: negative cough, hemoptysis, had dyspnea Cards: negative for chest pain, palpitations, has  lower extremity edema GU: negative for frequency, dysuria and hematuria GI: Negative for  abdominal pain, diarrhea, bleeding, constipation Skin: negative for  rash and pruritus Heme: negative for easy bruising and gum/nose bleeding MS: general weakness Neurolo: headaches, poor sleep  Endocrine: , diabetes Allergy/Immunology- as above Objective:  VITALS:  BP 132/82   Pulse 75   Resp 16   Ht _0  (1.626 m)   Wt (!) 331 lb 9.1 oz (150.4 kg)   LMP 09/06/2016 (Approximate)   SpO2 99% Comment: 3 L O2  BMI 56.91 kg/m  PHYSICAL EXAM:  General: Alert, cooperative, no distress, appears stated age.  On nasal cannula oxygen Head: Normocephalic, without obvious abnormality, atraumatic. Eyes: Conjunctivae clear, anicteric sclerae. Pupils are equal ENT Nares normal. No drainage or sinus tenderness. Lips, mucosa, and tongue normal. No Thrush Neck: Supple, symmetrical, no adenopathy, thyroid: non tender no carotid bruit and no JVD. Back: No CVA tenderness. Lungs: Clear to auscultation bilaterally. No Wheezing or Rhonchi. No rales. Heart: Regular rate and rhythm, no murmur, rub or gallop. Abdomen: did not examine as she was in wheel chair Extremities:rt5th toe amputation- site is improving than before Overall foot less edematous Packing from dorsum removed and the tissue is less necrotic Skin: hyperpigmented papules over face Lymph: Cervical, supraclavicular normal. Neurologic: Grossly non-focal Pertinent Labs Lab Results CBC    Component Value Date/Time   WBC 7.7 11/16/2020 0618   RBC 3.05 (L) 11/16/2020 0618   HGB 9.0 (L) 11/16/2020 0618   HGB 12.4 01/11/2014 1324   HCT 30.0 (L) 11/16/2020 0618   HCT 39.2 01/11/2014 1324   PLT 328 11/16/2020 0618   PLT 370 01/11/2014 1324   MCV 98.4 11/16/2020 0618   MCV 86 01/11/2014 1324   MCH 29.5 11/16/2020 0618   MCHC 30.0 11/16/2020 0618   RDW 14.9 11/16/2020 0618   RDW 15.1 (H) 01/11/2014 1324   LYMPHSABS 1.1 10/31/2020 0408   LYMPHSABS 1.8 01/11/2014 1324   MONOABS 1.0 10/31/2020 0408   MONOABS 0.5 01/11/2014 1324    EOSABS 0.1 10/31/2020 0408   EOSABS 0.2 01/11/2014 1324   BASOSABS 0.1 10/31/2020 0408   BASOSABS 0.1 01/11/2014 1324    CMP Latest Ref Rng & Units 11/16/2020 11/15/2020 11/13/2020  Glucose 70 - 99 mg/dL 160(H) 154(H) 128(H)  BUN 6 - 20 mg/dL 58(H) 45(H) 57(H)  Creatinine 0.44 - 1.00 mg/dL 7.45(H) 6.72(H) 7.90(H)  Sodium 135 - 145 mmol/L 129(L) 133(L) 131(L)  Potassium 3.5 - 5.1 mmol/L 4.6 4.6 4.5  Chloride 98 - 111 mmol/L 89(L) 91(L) 92(L)  CO2 22 - 32 mmol/L _1 Calcium 8.9 - 10.3 mg/dL 7.8(L) 7.8(L) 7.4(L)  Total Protein 6.5 - 8.1 g/dL - - -  Total Bilirubin 0.3 - 1.2 mg/dL - - -  Alkaline Phos 38 - 126 U/L - - -  AST 15 - 41 U/L - - -  ALT 0 - 44 U/L - - -      Microbiology: Recent Results (from the past 240 hour(s))  Resp Panel by RT-PCR (Flu A&B, Covid) Nasopharyngeal Swab     Status: None   Collection Time: 11/14/20  3:45 PM   Specimen: Nasopharyngeal Swab; Nasopharyngeal(NP) swabs in vial transport medium  Result Value Ref Range Status   SARS Coronavirus 2 by RT PCR NEGATIVE NEGATIVE Final    Comment: (NOTE) SARS-CoV-2 target nucleic acids are NOT DETECTED.  The SARS-CoV-2 RNA is generally detectable in upper respiratory specimens during the acute phase of infection. The lowest concentration of SARS-CoV-2 viral copies this assay can detect is 138 copies/mL. A negative result does not preclude SARS-Cov-2 infection and should  not be used as the sole basis for treatment or other patient management decisions. A negative result may occur with  improper specimen collection/handling, submission of specimen other than nasopharyngeal swab, presence of viral mutation(s) within the areas targeted by this assay, and inadequate number of viral copies(<138 copies/mL). A negative result must be combined with clinical observations, patient history, and epidemiological information. The expected result is Negative.  Fact Sheet for Patients:   EntrepreneurPulse.com.au  Fact Sheet for Healthcare Providers:  IncredibleEmployment.be  This test is no t yet approved or cleared by the [place] FDA and  has been authorized for detection and/or diagnosis of SARS-CoV-2 by FDA under an Emergency Use Authorization (EUA). This EUA will remain  in effect (meaning this test can be used) for the duration of the COVID-19 declaration under Section 564(b)(1) of the Act, 21 U.S.C.section 360bbb-3(b)(1), unless the authorization is terminated  or revoked sooner.       Influenza A by PCR NEGATIVE NEGATIVE Final   Influenza B by PCR NEGATIVE NEGATIVE Final    Comment: (NOTE) The Xpert Xpress SARS-CoV-2/FLU/RSV plus assay is intended as an aid in the diagnosis of influenza from Nasopharyngeal swab specimens and should not be used as a sole basis for treatment. Nasal washings and aspirates are unacceptable for Xpert Xpress SARS-CoV-2/FLU/RSV testing.  Fact Sheet for Patients: EntrepreneurPulse.com.au  Fact Sheet for Healthcare Providers: IncredibleEmployment.be  This test is not yet approved or cleared by the [place] FDA and has been authorized for detection and/or diagnosis of SARS-CoV-2 by FDA under an Emergency Use Authorization (EUA). This EUA will remain in effect (meaning this test can be used) for the duration of the COVID-19 declaration under Section 564(b)(1) of the Act, 21 U.S.C. section 360bbb-3(b)(1), unless the authorization is terminated or revoked.  Performed at [facility], [address] [place], [place] [address]   MRSA Next Gen by PCR, Nasal     Status: None   Collection Time: 11/16/20 12:15 AM   Specimen: Nasal Mucosa; Nasal Swab  Result Value Ref Range Status   MRSA by PCR Next Gen NOT DETECTED NOT DETECTED Final    Comment: (NOTE) The GeneXpert MRSA Assay (FDA approved for NASAL specimens only), is one component of a  comprehensive MRSA colonization surveillance program. It is not intended to diagnose MRSA infection nor to guide or monitor treatment for MRSA infections. Test performance is not FDA approved in patients less than 7 years old. Performed at [facility], [address] [place], [place] [address]     IMAGING RESULTS: I have personally reviewed the films ? Impression/Recommendation ?? Diabetes mellitus with rt foot necrotic infection- s/p ray amputation of 5th toe and I/D of complex abscess with debridement- she declined amputation and wanted to take a chance with Iv antibiotics- has received 4 weeks of Vanco and cefepime and the foot is better than before- will get labs today and continue 4 more weeks of antibiotic during dilaysis She will have to follow up with podiatrist         OPAT Orders During dialysis Vanco 1 gram on the day of dialysis Ceftazidime 1gram -1 gram -2 gram  on days of dialysis Aim for pre hemodialysis Vancomycin level  15-25 (unless otherwise indicated) Duration: 4 more weeks   End Date: 12/19/20     Labs weekly while on IV antibiotics: _X CBC with differential   _X CMP    X Esr X CRP   Fax weekly labs to  Dr>Dimetrius Montfort([phone])  [phone] Follow up appt  4-5 weeks   ___________________________________________________ Discussed with patient, requesting provider Note:  This document was prepared using Dragon voice recognition software and may include unintentional dictation errors.

## 2020-11-21 NOTE — Patient Instructions (Signed)
You are here for follow up of rt foot infection with diabetes- on Iv vanco and ceftazidime during dialysis and better- so will continue for 4 more weeks until 12/19/20. Will get labs today- will follow up in 5 weeks

## 2020-11-22 DIAGNOSIS — D631 Anemia in chronic kidney disease: Secondary | ICD-10-CM | POA: Diagnosis not present

## 2020-11-22 DIAGNOSIS — N186 End stage renal disease: Secondary | ICD-10-CM | POA: Diagnosis not present

## 2020-11-22 DIAGNOSIS — L97511 Non-pressure chronic ulcer of other part of right foot limited to breakdown of skin: Secondary | ICD-10-CM | POA: Diagnosis not present

## 2020-11-22 DIAGNOSIS — E11621 Type 2 diabetes mellitus with foot ulcer: Secondary | ICD-10-CM | POA: Diagnosis not present

## 2020-11-22 DIAGNOSIS — I132 Hypertensive heart and chronic kidney disease with heart failure and with stage 5 chronic kidney disease, or end stage renal disease: Secondary | ICD-10-CM | POA: Diagnosis not present

## 2020-11-22 DIAGNOSIS — E1122 Type 2 diabetes mellitus with diabetic chronic kidney disease: Secondary | ICD-10-CM | POA: Diagnosis not present

## 2020-11-22 DIAGNOSIS — E1152 Type 2 diabetes mellitus with diabetic peripheral angiopathy with gangrene: Secondary | ICD-10-CM | POA: Diagnosis not present

## 2020-11-22 DIAGNOSIS — I5032 Chronic diastolic (congestive) heart failure: Secondary | ICD-10-CM | POA: Diagnosis not present

## 2020-11-22 DIAGNOSIS — E1151 Type 2 diabetes mellitus with diabetic peripheral angiopathy without gangrene: Secondary | ICD-10-CM | POA: Diagnosis not present

## 2020-11-23 DIAGNOSIS — T8743 Infection of amputation stump, right lower extremity: Secondary | ICD-10-CM | POA: Diagnosis not present

## 2020-11-23 DIAGNOSIS — Z992 Dependence on renal dialysis: Secondary | ICD-10-CM | POA: Diagnosis not present

## 2020-11-23 DIAGNOSIS — N186 End stage renal disease: Secondary | ICD-10-CM | POA: Diagnosis not present

## 2020-11-24 DIAGNOSIS — L97511 Non-pressure chronic ulcer of other part of right foot limited to breakdown of skin: Secondary | ICD-10-CM | POA: Diagnosis not present

## 2020-11-24 DIAGNOSIS — I132 Hypertensive heart and chronic kidney disease with heart failure and with stage 5 chronic kidney disease, or end stage renal disease: Secondary | ICD-10-CM | POA: Diagnosis not present

## 2020-11-24 DIAGNOSIS — E1152 Type 2 diabetes mellitus with diabetic peripheral angiopathy with gangrene: Secondary | ICD-10-CM | POA: Diagnosis not present

## 2020-11-24 DIAGNOSIS — Z992 Dependence on renal dialysis: Secondary | ICD-10-CM | POA: Diagnosis not present

## 2020-11-24 DIAGNOSIS — E1122 Type 2 diabetes mellitus with diabetic chronic kidney disease: Secondary | ICD-10-CM | POA: Diagnosis not present

## 2020-11-24 DIAGNOSIS — I5032 Chronic diastolic (congestive) heart failure: Secondary | ICD-10-CM | POA: Diagnosis not present

## 2020-11-24 DIAGNOSIS — D631 Anemia in chronic kidney disease: Secondary | ICD-10-CM | POA: Diagnosis not present

## 2020-11-24 DIAGNOSIS — N186 End stage renal disease: Secondary | ICD-10-CM | POA: Diagnosis not present

## 2020-11-24 DIAGNOSIS — E11621 Type 2 diabetes mellitus with foot ulcer: Secondary | ICD-10-CM | POA: Diagnosis not present

## 2020-11-24 DIAGNOSIS — E1151 Type 2 diabetes mellitus with diabetic peripheral angiopathy without gangrene: Secondary | ICD-10-CM | POA: Diagnosis not present

## 2020-11-25 DIAGNOSIS — N186 End stage renal disease: Secondary | ICD-10-CM | POA: Diagnosis not present

## 2020-11-25 DIAGNOSIS — I5040 Unspecified combined systolic (congestive) and diastolic (congestive) heart failure: Secondary | ICD-10-CM | POA: Diagnosis not present

## 2020-11-25 DIAGNOSIS — Z992 Dependence on renal dialysis: Secondary | ICD-10-CM | POA: Diagnosis not present

## 2020-11-25 DIAGNOSIS — J45909 Unspecified asthma, uncomplicated: Secondary | ICD-10-CM | POA: Diagnosis not present

## 2020-11-25 DIAGNOSIS — T8743 Infection of amputation stump, right lower extremity: Secondary | ICD-10-CM | POA: Diagnosis not present

## 2020-11-27 DIAGNOSIS — E1122 Type 2 diabetes mellitus with diabetic chronic kidney disease: Secondary | ICD-10-CM | POA: Diagnosis not present

## 2020-11-27 DIAGNOSIS — E1151 Type 2 diabetes mellitus with diabetic peripheral angiopathy without gangrene: Secondary | ICD-10-CM | POA: Diagnosis not present

## 2020-11-27 DIAGNOSIS — Z992 Dependence on renal dialysis: Secondary | ICD-10-CM | POA: Diagnosis not present

## 2020-11-27 DIAGNOSIS — D631 Anemia in chronic kidney disease: Secondary | ICD-10-CM | POA: Diagnosis not present

## 2020-11-27 DIAGNOSIS — E1152 Type 2 diabetes mellitus with diabetic peripheral angiopathy with gangrene: Secondary | ICD-10-CM | POA: Diagnosis not present

## 2020-11-27 DIAGNOSIS — I132 Hypertensive heart and chronic kidney disease with heart failure and with stage 5 chronic kidney disease, or end stage renal disease: Secondary | ICD-10-CM | POA: Diagnosis not present

## 2020-11-27 DIAGNOSIS — Z794 Long term (current) use of insulin: Secondary | ICD-10-CM | POA: Diagnosis not present

## 2020-11-27 DIAGNOSIS — Z9981 Dependence on supplemental oxygen: Secondary | ICD-10-CM | POA: Diagnosis not present

## 2020-11-27 DIAGNOSIS — L97511 Non-pressure chronic ulcer of other part of right foot limited to breakdown of skin: Secondary | ICD-10-CM | POA: Diagnosis not present

## 2020-11-27 DIAGNOSIS — N186 End stage renal disease: Secondary | ICD-10-CM | POA: Diagnosis not present

## 2020-11-27 DIAGNOSIS — E11621 Type 2 diabetes mellitus with foot ulcer: Secondary | ICD-10-CM | POA: Diagnosis not present

## 2020-11-27 DIAGNOSIS — I5032 Chronic diastolic (congestive) heart failure: Secondary | ICD-10-CM | POA: Diagnosis not present

## 2020-11-28 ENCOUNTER — Telehealth: Payer: Self-pay

## 2020-11-28 DIAGNOSIS — N2581 Secondary hyperparathyroidism of renal origin: Secondary | ICD-10-CM | POA: Diagnosis not present

## 2020-11-28 DIAGNOSIS — N186 End stage renal disease: Secondary | ICD-10-CM | POA: Diagnosis not present

## 2020-11-28 DIAGNOSIS — Z992 Dependence on renal dialysis: Secondary | ICD-10-CM | POA: Diagnosis not present

## 2020-11-28 DIAGNOSIS — J449 Chronic obstructive pulmonary disease, unspecified: Secondary | ICD-10-CM | POA: Diagnosis not present

## 2020-11-28 DIAGNOSIS — E1165 Type 2 diabetes mellitus with hyperglycemia: Secondary | ICD-10-CM | POA: Diagnosis not present

## 2020-11-28 DIAGNOSIS — T8743 Infection of amputation stump, right lower extremity: Secondary | ICD-10-CM | POA: Diagnosis not present

## 2020-11-28 NOTE — Telephone Encounter (Signed)
See previous call and call back if no word by 11/30/2020.

## 2020-11-28 NOTE — Telephone Encounter (Signed)
Called to review labs. Still has infection. Did she see podiatry and is she getting IV ABX still at Dialysis? I did send them the extended by one month orders and called to alert the change. Patient did not answer I left vm to call me back regarding labs and to go over if she is getting the meds and podiatry consults.

## 2020-11-29 DIAGNOSIS — N186 End stage renal disease: Secondary | ICD-10-CM | POA: Diagnosis not present

## 2020-11-29 DIAGNOSIS — D631 Anemia in chronic kidney disease: Secondary | ICD-10-CM | POA: Diagnosis not present

## 2020-11-29 DIAGNOSIS — I132 Hypertensive heart and chronic kidney disease with heart failure and with stage 5 chronic kidney disease, or end stage renal disease: Secondary | ICD-10-CM | POA: Diagnosis not present

## 2020-11-29 DIAGNOSIS — I5032 Chronic diastolic (congestive) heart failure: Secondary | ICD-10-CM | POA: Diagnosis not present

## 2020-11-29 DIAGNOSIS — E1122 Type 2 diabetes mellitus with diabetic chronic kidney disease: Secondary | ICD-10-CM | POA: Diagnosis not present

## 2020-11-29 DIAGNOSIS — E1152 Type 2 diabetes mellitus with diabetic peripheral angiopathy with gangrene: Secondary | ICD-10-CM | POA: Diagnosis not present

## 2020-11-29 DIAGNOSIS — E1151 Type 2 diabetes mellitus with diabetic peripheral angiopathy without gangrene: Secondary | ICD-10-CM | POA: Diagnosis not present

## 2020-11-29 DIAGNOSIS — Z794 Long term (current) use of insulin: Secondary | ICD-10-CM | POA: Diagnosis not present

## 2020-11-29 DIAGNOSIS — Z9981 Dependence on supplemental oxygen: Secondary | ICD-10-CM | POA: Diagnosis not present

## 2020-11-29 DIAGNOSIS — Z992 Dependence on renal dialysis: Secondary | ICD-10-CM | POA: Diagnosis not present

## 2020-11-29 DIAGNOSIS — E11621 Type 2 diabetes mellitus with foot ulcer: Secondary | ICD-10-CM | POA: Diagnosis not present

## 2020-11-29 DIAGNOSIS — L97511 Non-pressure chronic ulcer of other part of right foot limited to breakdown of skin: Secondary | ICD-10-CM | POA: Diagnosis not present

## 2020-11-30 DIAGNOSIS — T8743 Infection of amputation stump, right lower extremity: Secondary | ICD-10-CM | POA: Diagnosis not present

## 2020-11-30 DIAGNOSIS — N186 End stage renal disease: Secondary | ICD-10-CM | POA: Diagnosis not present

## 2020-11-30 DIAGNOSIS — Z992 Dependence on renal dialysis: Secondary | ICD-10-CM | POA: Diagnosis not present

## 2020-11-30 DIAGNOSIS — M869 Osteomyelitis, unspecified: Secondary | ICD-10-CM | POA: Diagnosis not present

## 2020-12-01 ENCOUNTER — Encounter: Payer: Self-pay | Admitting: Oncology

## 2020-12-01 DIAGNOSIS — L97511 Non-pressure chronic ulcer of other part of right foot limited to breakdown of skin: Secondary | ICD-10-CM | POA: Diagnosis not present

## 2020-12-01 DIAGNOSIS — N186 End stage renal disease: Secondary | ICD-10-CM | POA: Diagnosis not present

## 2020-12-01 DIAGNOSIS — E1122 Type 2 diabetes mellitus with diabetic chronic kidney disease: Secondary | ICD-10-CM | POA: Diagnosis not present

## 2020-12-01 DIAGNOSIS — E1152 Type 2 diabetes mellitus with diabetic peripheral angiopathy with gangrene: Secondary | ICD-10-CM | POA: Diagnosis not present

## 2020-12-01 DIAGNOSIS — E11621 Type 2 diabetes mellitus with foot ulcer: Secondary | ICD-10-CM | POA: Diagnosis not present

## 2020-12-01 DIAGNOSIS — I5032 Chronic diastolic (congestive) heart failure: Secondary | ICD-10-CM | POA: Diagnosis not present

## 2020-12-01 DIAGNOSIS — I132 Hypertensive heart and chronic kidney disease with heart failure and with stage 5 chronic kidney disease, or end stage renal disease: Secondary | ICD-10-CM | POA: Diagnosis not present

## 2020-12-01 DIAGNOSIS — Z9981 Dependence on supplemental oxygen: Secondary | ICD-10-CM | POA: Diagnosis not present

## 2020-12-01 DIAGNOSIS — Z992 Dependence on renal dialysis: Secondary | ICD-10-CM | POA: Diagnosis not present

## 2020-12-01 DIAGNOSIS — D631 Anemia in chronic kidney disease: Secondary | ICD-10-CM | POA: Diagnosis not present

## 2020-12-01 DIAGNOSIS — Z794 Long term (current) use of insulin: Secondary | ICD-10-CM | POA: Diagnosis not present

## 2020-12-01 DIAGNOSIS — E1151 Type 2 diabetes mellitus with diabetic peripheral angiopathy without gangrene: Secondary | ICD-10-CM | POA: Diagnosis not present

## 2020-12-02 DIAGNOSIS — M869 Osteomyelitis, unspecified: Secondary | ICD-10-CM | POA: Diagnosis not present

## 2020-12-02 DIAGNOSIS — N186 End stage renal disease: Secondary | ICD-10-CM | POA: Diagnosis not present

## 2020-12-02 DIAGNOSIS — Z992 Dependence on renal dialysis: Secondary | ICD-10-CM | POA: Diagnosis not present

## 2020-12-02 DIAGNOSIS — T8743 Infection of amputation stump, right lower extremity: Secondary | ICD-10-CM | POA: Diagnosis not present

## 2020-12-04 ENCOUNTER — Telehealth: Payer: Self-pay | Admitting: Pulmonary Disease

## 2020-12-04 ENCOUNTER — Other Ambulatory Visit: Payer: Self-pay | Admitting: Pulmonary Disease

## 2020-12-04 DIAGNOSIS — Z794 Long term (current) use of insulin: Secondary | ICD-10-CM | POA: Diagnosis not present

## 2020-12-04 DIAGNOSIS — I5032 Chronic diastolic (congestive) heart failure: Secondary | ICD-10-CM | POA: Diagnosis not present

## 2020-12-04 DIAGNOSIS — L97511 Non-pressure chronic ulcer of other part of right foot limited to breakdown of skin: Secondary | ICD-10-CM | POA: Diagnosis not present

## 2020-12-04 DIAGNOSIS — E1151 Type 2 diabetes mellitus with diabetic peripheral angiopathy without gangrene: Secondary | ICD-10-CM | POA: Diagnosis not present

## 2020-12-04 DIAGNOSIS — I132 Hypertensive heart and chronic kidney disease with heart failure and with stage 5 chronic kidney disease, or end stage renal disease: Secondary | ICD-10-CM | POA: Diagnosis not present

## 2020-12-04 DIAGNOSIS — E1122 Type 2 diabetes mellitus with diabetic chronic kidney disease: Secondary | ICD-10-CM | POA: Diagnosis not present

## 2020-12-04 DIAGNOSIS — N186 End stage renal disease: Secondary | ICD-10-CM | POA: Diagnosis not present

## 2020-12-04 DIAGNOSIS — Z9981 Dependence on supplemental oxygen: Secondary | ICD-10-CM | POA: Diagnosis not present

## 2020-12-04 DIAGNOSIS — E11621 Type 2 diabetes mellitus with foot ulcer: Secondary | ICD-10-CM | POA: Diagnosis not present

## 2020-12-04 DIAGNOSIS — T8743 Infection of amputation stump, right lower extremity: Secondary | ICD-10-CM | POA: Diagnosis not present

## 2020-12-04 DIAGNOSIS — E1152 Type 2 diabetes mellitus with diabetic peripheral angiopathy with gangrene: Secondary | ICD-10-CM | POA: Diagnosis not present

## 2020-12-04 DIAGNOSIS — D631 Anemia in chronic kidney disease: Secondary | ICD-10-CM | POA: Diagnosis not present

## 2020-12-04 DIAGNOSIS — Z992 Dependence on renal dialysis: Secondary | ICD-10-CM | POA: Diagnosis not present

## 2020-12-04 MED ORDER — TRELEGY ELLIPTA 200-62.5-25 MCG/INH IN AEPB: INHALATION_SPRAY | RESPIRATORY_TRACT | 0 refills | Status: DC

## 2020-12-04 NOTE — Telephone Encounter (Signed)
Spoke to patient, who is requesting a refill on Trelegy. One month supply has been sent to preferred pharmacy.  She is aware to keep scheduled visit for further refills.  Nothing further needed.

## 2020-12-05 ENCOUNTER — Ambulatory Visit (INDEPENDENT_AMBULATORY_CARE_PROVIDER_SITE_OTHER): Payer: Medicare Other | Admitting: Podiatry

## 2020-12-05 ENCOUNTER — Telehealth: Payer: Self-pay

## 2020-12-05 ENCOUNTER — Other Ambulatory Visit: Payer: Self-pay

## 2020-12-05 DIAGNOSIS — I96 Gangrene, not elsewhere classified: Secondary | ICD-10-CM

## 2020-12-05 DIAGNOSIS — E11621 Type 2 diabetes mellitus with foot ulcer: Secondary | ICD-10-CM

## 2020-12-05 DIAGNOSIS — L97514 Non-pressure chronic ulcer of other part of right foot with necrosis of bone: Secondary | ICD-10-CM

## 2020-12-05 DIAGNOSIS — Z992 Dependence on renal dialysis: Secondary | ICD-10-CM | POA: Diagnosis not present

## 2020-12-05 DIAGNOSIS — N186 End stage renal disease: Secondary | ICD-10-CM | POA: Diagnosis not present

## 2020-12-05 DIAGNOSIS — T8743 Infection of amputation stump, right lower extremity: Secondary | ICD-10-CM | POA: Diagnosis not present

## 2020-12-05 NOTE — Progress Notes (Signed)
Subjective:  Patient ID: [name], female    DOB: 1970-08-30,  MRN: [id number]  Chief Complaint  Patient presents with   Foot Pain    Right foot     50 y.o. female presents with the above complaint.  Patient presents with complaint of right foot gangrene with ulceration to the right lateral foot and right plantar midfoot.  Patient had gone undergone multiple I&D in the hospital setting by our group.  Patient refused below the knee amputation and is currently being treated by local wound care and antibiotics per infectious disease.  She is here for her evaluation.  She has been getting dressing Betadine changes 3 times a week.  She denies any nausea fever chills vomiting.  She states the foot is feeling better.  She denies any other acute complaints.   Review of Systems: Negative except as noted in the HPI. Denies N/V/F/Ch.  Past Medical History:  Diagnosis Date   (HFpEF) heart failure with preserved ejection fraction ([place])    a. 11/2014 Echo: EF 50-55%, Gr1 DD; b. 06/2016 Echo: EF 60-65%, no rwma, mod dil LA, nl RV, triv eff.   Anxiety    Asthma    Bronchitis    CHF (congestive heart failure) (HCC)    Chronic cough    Chronic respiratory failure with hypoxia and hypercapnia ([place])    a. Home O2.  (2-4L)   CKD (chronic kidney disease), stage III (HCC)    Depression    Diabetes mellitus without complication (HCC)    Diverticulosis    Dyspnea    Dysrhythmia    per pt, no arrythmias   Environmental and seasonal allergies    Extreme obesity with alveolar hypoventilation (HCC)    Hypertension    Iron deficiency anemia    a. chronic blood loss->heavy menses.   Kidney failure    dialysis tues/thur/sat   ... left arm shunt   Orthopnea    Oxygen dependent    3l/min   Pericardial effusion    a. 11/2014 CT Chest: Mod effusion; b. 11/2014 Echo: small to mod circumferential effusion. No hemodynamic compromise.   Pneumonia    history   Poorly controlled diabetes mellitus ([place])     a. 11/2014 A1c 13.2.   Swelling of both lower extremities     Current Outpatient Medications:    acetaminophen (TYLENOL) 500 MG tablet, Take 1,000 mg by mouth every 6 (six) hours as needed for mild pain, fever or headache., Disp: , Rfl:    albuterol (PROVENTIL) (2.5 MG/3ML) 0.083% nebulizer solution, Take 3 mLs (2.5 mg total) by nebulization every 6 (six) hours as needed for wheezing or shortness of breath., Disp: 75 mL, Rfl: 12   albuterol (VENTOLIN HFA) 108 (90 Base) MCG/ACT inhaler, Inhale 2 puffs into the lungs every 4 (four) hours as needed for wheezing or shortness of breath., Disp: 18 g, Rfl: 2   aspirin EC 81 MG tablet, Take 81 mg by mouth daily. , Disp: , Rfl:    b complex-vitamin c-folic acid (NEPHRO-VITE) 0.8 MG TABS tablet, Take 1 tablet by mouth at bedtime., Disp: , Rfl:    Cholecalciferol 100 MCG (4000 UT) CAPS, Take 1 capsule by mouth once a week., Disp: , Rfl:    clindamycin (CLEOCIN T) 1 % lotion, Apply 1 application topically daily., Disp: , Rfl:    docusate sodium (COLACE) 100 MG capsule, Take 100 mg by mouth daily., Disp: , Rfl:    ferric citrate (AURYXIA) 1 GM 210 MG(Fe) tablet,  Take 210 mg by mouth 3 (three) times daily with meals., Disp: , Rfl:    Fluticasone-Umeclidin-Vilant (TRELEGY ELLIPTA) 200-62.5-25 MCG/INH AEPB, INHALE 1 PUFF BY MOUTH ONCE DAILY. APPOINTMENT NEEDED FOR FURTHER REFILLS., Disp: 60 each, Rfl: 0   insulin aspart (NOVOLOG) 100 UNIT/ML injection, Inject 0-5 Units into the skin at bedtime., Disp: 10 mL, Rfl: 11   insulin aspart (NOVOLOG) 100 UNIT/ML injection, Inject 0-15 Units into the skin 3 (three) times daily with meals., Disp: 10 mL, Rfl: 11   insulin degludec (TRESIBA) 100 UNIT/ML FlexTouch Pen, Inject 43 Units into the skin daily at 10 pm., Disp: , Rfl:    ipratropium-albuterol (DUONEB) 0.5-2.5 (3) MG/3ML SOLN, Take 3 mLs by nebulization every 6 (six) hours as needed., Disp: 360 mL, Rfl: 5   lactulose (CHRONULAC) 10 GM/15ML solution, Take 30 mLs (20 g  total) by mouth daily as needed for severe constipation., Disp: 240 mL, Rfl: 0   levocetirizine (XYZAL) 5 MG tablet, Take 5 mg by mouth at bedtime., Disp: , Rfl:    lidocaine-prilocaine (EMLA) cream, Apply topically as needed (For access for dialysis)., Disp: , Rfl:    LINZESS 290 MCG CAPS capsule, Take 290 mcg by mouth every morning., Disp: , Rfl:    metoprolol tartrate (LOPRESSOR) 25 MG tablet, Take 1 tablet (25 mg total) by mouth 2 (two) times daily., Disp: 30 tablet, Rfl: 0   midodrine (PROAMATINE) 10 MG tablet, Take 20 mg by mouth daily as needed., Disp: , Rfl:    omeprazole (PRILOSEC) 20 MG capsule, Take 20 mg by mouth daily., Disp: , Rfl:    oxyCODONE-acetaminophen (PERCOCET) 5-325 MG tablet, Take 1 tablet by mouth every 4 (four) hours as needed for severe pain., Disp: 12 tablet, Rfl: 0   OXYGEN, Inhale 2-4 L into the lungs., Disp: , Rfl:   Social History   Tobacco Use  Smoking Status Never  Smokeless Tobacco Never    Allergies  Allergen Reactions   Dust Mite Extract Shortness Of Breath and Itching   Mold Extract [Trichophyton] Shortness Of Breath and Itching   Pollen Extract Shortness Of Breath    Itching, shortness of breath, asthma like symptoms   Sulfa Antibiotics Itching   Feraheme [Ferumoxytol] Itching    Uses benadryl so she can get the medicine   Objective:  There were no vitals filed for this visit. There is no height or weight on file to calculate BMI. Constitutional Well developed. Well nourished.  Vascular Dorsalis pedis pulses palpable bilaterally. Posterior tibial pulses palpable bilaterally. Capillary refill normal to all digits.  No cyanosis or clubbing noted. Pedal hair growth normal.  Neurologic Normal speech. Oriented to person, place, and time. Epicritic sensation to light touch grossly present bilaterally.  Dermatologic Nails well groomed and normal in appearance. No open wounds. No skin lesions.  Orthopedic: Dry stable gangrene noted to the  forefoot up to the level of the midfoot.  Plantar midfoot and right lateral foot ulceration noted with fibrogranular wound base no purulent drainage expressed.  No malodor present.  No probing down to deep tissue or bone   Radiographs: None Assessment:   1. Diabetic ulcer of toe of right foot associated with type 2 diabetes mellitus, with necrosis of bone ([place])   2. Gangrene of right foot ([place])    Plan:  Patient was evaluated and treated and all questions answered.  Right forefoot gangrene with midfoot and right lateral foot ulceration with history of multiple incision and drainage washout debridement -Clinically patient has  dry stable gangrene with noninfectious ulceration.  I rediscussed with her that she would benefit from below the knee amputation however patient continues to refuse.  I discussed with her that at this time it appears to be dry and stable without it any clinical signs of infection however if it progresses and worsens have asked her to go to the emergency room at that time patient will need below the knee amputation.  She states understanding and agrees with the plan. -Continue local wound care and home nursing Betadine wet-to-dry dressing changes -We will continue to clinically monitor with local wound care. -Nonweightbearing to the right lower extremity in wheelchair -Antibiotics per infectious disease  No follow-ups on file.

## 2020-12-05 NOTE — Telephone Encounter (Signed)
[facility]ed [facility] and spoke to [employer] who agreed to fax [place] latest lab results.

## 2020-12-07 DIAGNOSIS — N186 End stage renal disease: Secondary | ICD-10-CM | POA: Diagnosis not present

## 2020-12-07 DIAGNOSIS — T8743 Infection of amputation stump, right lower extremity: Secondary | ICD-10-CM | POA: Diagnosis not present

## 2020-12-07 DIAGNOSIS — L97509 Non-pressure chronic ulcer of other part of unspecified foot with unspecified severity: Secondary | ICD-10-CM | POA: Diagnosis not present

## 2020-12-07 DIAGNOSIS — J9610 Chronic respiratory failure, unspecified whether with hypoxia or hypercapnia: Secondary | ICD-10-CM | POA: Diagnosis not present

## 2020-12-07 DIAGNOSIS — I96 Gangrene, not elsewhere classified: Secondary | ICD-10-CM | POA: Diagnosis not present

## 2020-12-07 DIAGNOSIS — J449 Chronic obstructive pulmonary disease, unspecified: Secondary | ICD-10-CM | POA: Diagnosis not present

## 2020-12-07 DIAGNOSIS — I5030 Unspecified diastolic (congestive) heart failure: Secondary | ICD-10-CM | POA: Diagnosis not present

## 2020-12-07 DIAGNOSIS — Z992 Dependence on renal dialysis: Secondary | ICD-10-CM | POA: Diagnosis not present

## 2020-12-08 ENCOUNTER — Encounter: Payer: Self-pay | Admitting: Oncology

## 2020-12-09 DIAGNOSIS — Z992 Dependence on renal dialysis: Secondary | ICD-10-CM | POA: Diagnosis not present

## 2020-12-09 DIAGNOSIS — T8743 Infection of amputation stump, right lower extremity: Secondary | ICD-10-CM | POA: Diagnosis not present

## 2020-12-09 DIAGNOSIS — N186 End stage renal disease: Secondary | ICD-10-CM | POA: Diagnosis not present

## 2020-12-11 ENCOUNTER — Telehealth: Payer: Self-pay

## 2020-12-11 DIAGNOSIS — E1151 Type 2 diabetes mellitus with diabetic peripheral angiopathy without gangrene: Secondary | ICD-10-CM | POA: Diagnosis not present

## 2020-12-11 DIAGNOSIS — Z794 Long term (current) use of insulin: Secondary | ICD-10-CM | POA: Diagnosis not present

## 2020-12-11 DIAGNOSIS — E11621 Type 2 diabetes mellitus with foot ulcer: Secondary | ICD-10-CM | POA: Diagnosis not present

## 2020-12-11 DIAGNOSIS — N186 End stage renal disease: Secondary | ICD-10-CM | POA: Diagnosis not present

## 2020-12-11 DIAGNOSIS — E1122 Type 2 diabetes mellitus with diabetic chronic kidney disease: Secondary | ICD-10-CM | POA: Diagnosis not present

## 2020-12-11 DIAGNOSIS — E1152 Type 2 diabetes mellitus with diabetic peripheral angiopathy with gangrene: Secondary | ICD-10-CM | POA: Diagnosis not present

## 2020-12-11 DIAGNOSIS — I132 Hypertensive heart and chronic kidney disease with heart failure and with stage 5 chronic kidney disease, or end stage renal disease: Secondary | ICD-10-CM | POA: Diagnosis not present

## 2020-12-11 DIAGNOSIS — I5032 Chronic diastolic (congestive) heart failure: Secondary | ICD-10-CM | POA: Diagnosis not present

## 2020-12-11 DIAGNOSIS — L97511 Non-pressure chronic ulcer of other part of right foot limited to breakdown of skin: Secondary | ICD-10-CM | POA: Diagnosis not present

## 2020-12-11 DIAGNOSIS — Z9981 Dependence on supplemental oxygen: Secondary | ICD-10-CM | POA: Diagnosis not present

## 2020-12-11 DIAGNOSIS — Z992 Dependence on renal dialysis: Secondary | ICD-10-CM | POA: Diagnosis not present

## 2020-12-11 DIAGNOSIS — D631 Anemia in chronic kidney disease: Secondary | ICD-10-CM | POA: Diagnosis not present

## 2020-12-11 NOTE — Telephone Encounter (Signed)
Requested labs from [facility]. Faxing them to me at [facility]. Vanc Alice Rieger will be done tomorrow.

## 2020-12-12 ENCOUNTER — Ambulatory Visit: Payer: Medicare Other | Admitting: Podiatry

## 2020-12-12 DIAGNOSIS — N186 End stage renal disease: Secondary | ICD-10-CM | POA: Diagnosis not present

## 2020-12-12 DIAGNOSIS — J9610 Chronic respiratory failure, unspecified whether with hypoxia or hypercapnia: Secondary | ICD-10-CM | POA: Diagnosis not present

## 2020-12-12 DIAGNOSIS — T8743 Infection of amputation stump, right lower extremity: Secondary | ICD-10-CM | POA: Diagnosis not present

## 2020-12-12 DIAGNOSIS — I10 Essential (primary) hypertension: Secondary | ICD-10-CM | POA: Diagnosis not present

## 2020-12-12 DIAGNOSIS — F32A Depression, unspecified: Secondary | ICD-10-CM | POA: Diagnosis not present

## 2020-12-12 DIAGNOSIS — N2581 Secondary hyperparathyroidism of renal origin: Secondary | ICD-10-CM | POA: Diagnosis not present

## 2020-12-12 DIAGNOSIS — E1165 Type 2 diabetes mellitus with hyperglycemia: Secondary | ICD-10-CM | POA: Diagnosis not present

## 2020-12-12 DIAGNOSIS — Z992 Dependence on renal dialysis: Secondary | ICD-10-CM | POA: Diagnosis not present

## 2020-12-12 DIAGNOSIS — J449 Chronic obstructive pulmonary disease, unspecified: Secondary | ICD-10-CM | POA: Diagnosis not present

## 2020-12-12 DIAGNOSIS — M869 Osteomyelitis, unspecified: Secondary | ICD-10-CM | POA: Diagnosis not present

## 2020-12-13 DIAGNOSIS — E1122 Type 2 diabetes mellitus with diabetic chronic kidney disease: Secondary | ICD-10-CM | POA: Diagnosis not present

## 2020-12-13 DIAGNOSIS — Z9981 Dependence on supplemental oxygen: Secondary | ICD-10-CM | POA: Diagnosis not present

## 2020-12-13 DIAGNOSIS — E11621 Type 2 diabetes mellitus with foot ulcer: Secondary | ICD-10-CM | POA: Diagnosis not present

## 2020-12-13 DIAGNOSIS — E1151 Type 2 diabetes mellitus with diabetic peripheral angiopathy without gangrene: Secondary | ICD-10-CM | POA: Diagnosis not present

## 2020-12-13 DIAGNOSIS — I132 Hypertensive heart and chronic kidney disease with heart failure and with stage 5 chronic kidney disease, or end stage renal disease: Secondary | ICD-10-CM | POA: Diagnosis not present

## 2020-12-13 DIAGNOSIS — N186 End stage renal disease: Secondary | ICD-10-CM | POA: Diagnosis not present

## 2020-12-13 DIAGNOSIS — Z992 Dependence on renal dialysis: Secondary | ICD-10-CM | POA: Diagnosis not present

## 2020-12-13 DIAGNOSIS — L97511 Non-pressure chronic ulcer of other part of right foot limited to breakdown of skin: Secondary | ICD-10-CM | POA: Diagnosis not present

## 2020-12-13 DIAGNOSIS — I5032 Chronic diastolic (congestive) heart failure: Secondary | ICD-10-CM | POA: Diagnosis not present

## 2020-12-13 DIAGNOSIS — Z794 Long term (current) use of insulin: Secondary | ICD-10-CM | POA: Diagnosis not present

## 2020-12-13 DIAGNOSIS — E1152 Type 2 diabetes mellitus with diabetic peripheral angiopathy with gangrene: Secondary | ICD-10-CM | POA: Diagnosis not present

## 2020-12-13 DIAGNOSIS — D631 Anemia in chronic kidney disease: Secondary | ICD-10-CM | POA: Diagnosis not present

## 2020-12-14 DIAGNOSIS — Z992 Dependence on renal dialysis: Secondary | ICD-10-CM | POA: Diagnosis not present

## 2020-12-14 DIAGNOSIS — N186 End stage renal disease: Secondary | ICD-10-CM | POA: Diagnosis not present

## 2020-12-14 DIAGNOSIS — T8743 Infection of amputation stump, right lower extremity: Secondary | ICD-10-CM | POA: Diagnosis not present

## 2020-12-16 DIAGNOSIS — Z992 Dependence on renal dialysis: Secondary | ICD-10-CM | POA: Diagnosis not present

## 2020-12-16 DIAGNOSIS — N186 End stage renal disease: Secondary | ICD-10-CM | POA: Diagnosis not present

## 2020-12-16 DIAGNOSIS — T8743 Infection of amputation stump, right lower extremity: Secondary | ICD-10-CM | POA: Diagnosis not present

## 2020-12-18 ENCOUNTER — Telehealth: Payer: Self-pay

## 2020-12-18 DIAGNOSIS — E11621 Type 2 diabetes mellitus with foot ulcer: Secondary | ICD-10-CM | POA: Diagnosis not present

## 2020-12-18 DIAGNOSIS — I132 Hypertensive heart and chronic kidney disease with heart failure and with stage 5 chronic kidney disease, or end stage renal disease: Secondary | ICD-10-CM | POA: Diagnosis not present

## 2020-12-18 DIAGNOSIS — E1152 Type 2 diabetes mellitus with diabetic peripheral angiopathy with gangrene: Secondary | ICD-10-CM | POA: Diagnosis not present

## 2020-12-18 DIAGNOSIS — J9611 Chronic respiratory failure with hypoxia: Secondary | ICD-10-CM | POA: Diagnosis not present

## 2020-12-18 DIAGNOSIS — Z794 Long term (current) use of insulin: Secondary | ICD-10-CM | POA: Diagnosis not present

## 2020-12-18 DIAGNOSIS — Z992 Dependence on renal dialysis: Secondary | ICD-10-CM | POA: Diagnosis not present

## 2020-12-18 DIAGNOSIS — L97511 Non-pressure chronic ulcer of other part of right foot limited to breakdown of skin: Secondary | ICD-10-CM | POA: Diagnosis not present

## 2020-12-18 DIAGNOSIS — J449 Chronic obstructive pulmonary disease, unspecified: Secondary | ICD-10-CM | POA: Diagnosis not present

## 2020-12-18 DIAGNOSIS — E1151 Type 2 diabetes mellitus with diabetic peripheral angiopathy without gangrene: Secondary | ICD-10-CM | POA: Diagnosis not present

## 2020-12-18 DIAGNOSIS — E1122 Type 2 diabetes mellitus with diabetic chronic kidney disease: Secondary | ICD-10-CM | POA: Diagnosis not present

## 2020-12-18 DIAGNOSIS — D631 Anemia in chronic kidney disease: Secondary | ICD-10-CM | POA: Diagnosis not present

## 2020-12-18 DIAGNOSIS — I5032 Chronic diastolic (congestive) heart failure: Secondary | ICD-10-CM | POA: Diagnosis not present

## 2020-12-18 DIAGNOSIS — N186 End stage renal disease: Secondary | ICD-10-CM | POA: Diagnosis not present

## 2020-12-18 DIAGNOSIS — Z9981 Dependence on supplemental oxygen: Secondary | ICD-10-CM | POA: Diagnosis not present

## 2020-12-18 NOTE — Telephone Encounter (Signed)
Requested lab report be faxed to me at [facility] to review and discuss with DR Ola Spurr. [facility] to fax today.

## 2020-12-19 DIAGNOSIS — T8743 Infection of amputation stump, right lower extremity: Secondary | ICD-10-CM | POA: Diagnosis not present

## 2020-12-19 DIAGNOSIS — Z992 Dependence on renal dialysis: Secondary | ICD-10-CM | POA: Diagnosis not present

## 2020-12-19 DIAGNOSIS — M869 Osteomyelitis, unspecified: Secondary | ICD-10-CM | POA: Diagnosis not present

## 2020-12-19 DIAGNOSIS — E119 Type 2 diabetes mellitus without complications: Secondary | ICD-10-CM | POA: Diagnosis not present

## 2020-12-19 DIAGNOSIS — N186 End stage renal disease: Secondary | ICD-10-CM | POA: Diagnosis not present

## 2020-12-19 DIAGNOSIS — D509 Iron deficiency anemia, unspecified: Secondary | ICD-10-CM | POA: Diagnosis not present

## 2020-12-19 DIAGNOSIS — Z794 Long term (current) use of insulin: Secondary | ICD-10-CM | POA: Diagnosis not present

## 2020-12-20 DIAGNOSIS — E1151 Type 2 diabetes mellitus with diabetic peripheral angiopathy without gangrene: Secondary | ICD-10-CM | POA: Diagnosis not present

## 2020-12-20 DIAGNOSIS — D631 Anemia in chronic kidney disease: Secondary | ICD-10-CM | POA: Diagnosis not present

## 2020-12-20 DIAGNOSIS — I132 Hypertensive heart and chronic kidney disease with heart failure and with stage 5 chronic kidney disease, or end stage renal disease: Secondary | ICD-10-CM | POA: Diagnosis not present

## 2020-12-20 DIAGNOSIS — E1152 Type 2 diabetes mellitus with diabetic peripheral angiopathy with gangrene: Secondary | ICD-10-CM | POA: Diagnosis not present

## 2020-12-20 DIAGNOSIS — E11621 Type 2 diabetes mellitus with foot ulcer: Secondary | ICD-10-CM | POA: Diagnosis not present

## 2020-12-20 DIAGNOSIS — L97511 Non-pressure chronic ulcer of other part of right foot limited to breakdown of skin: Secondary | ICD-10-CM | POA: Diagnosis not present

## 2020-12-20 DIAGNOSIS — Z9981 Dependence on supplemental oxygen: Secondary | ICD-10-CM | POA: Diagnosis not present

## 2020-12-20 DIAGNOSIS — Z992 Dependence on renal dialysis: Secondary | ICD-10-CM | POA: Diagnosis not present

## 2020-12-20 DIAGNOSIS — N186 End stage renal disease: Secondary | ICD-10-CM | POA: Diagnosis not present

## 2020-12-20 DIAGNOSIS — I5032 Chronic diastolic (congestive) heart failure: Secondary | ICD-10-CM | POA: Diagnosis not present

## 2020-12-20 DIAGNOSIS — E1122 Type 2 diabetes mellitus with diabetic chronic kidney disease: Secondary | ICD-10-CM | POA: Diagnosis not present

## 2020-12-20 DIAGNOSIS — Z794 Long term (current) use of insulin: Secondary | ICD-10-CM | POA: Diagnosis not present

## 2020-12-21 DIAGNOSIS — Z992 Dependence on renal dialysis: Secondary | ICD-10-CM | POA: Diagnosis not present

## 2020-12-21 DIAGNOSIS — N186 End stage renal disease: Secondary | ICD-10-CM | POA: Diagnosis not present

## 2020-12-21 DIAGNOSIS — T8743 Infection of amputation stump, right lower extremity: Secondary | ICD-10-CM | POA: Diagnosis not present

## 2020-12-22 ENCOUNTER — Other Ambulatory Visit: Payer: Self-pay | Admitting: Nurse Practitioner

## 2020-12-22 DIAGNOSIS — E11621 Type 2 diabetes mellitus with foot ulcer: Secondary | ICD-10-CM | POA: Diagnosis not present

## 2020-12-22 DIAGNOSIS — Z992 Dependence on renal dialysis: Secondary | ICD-10-CM | POA: Diagnosis not present

## 2020-12-22 DIAGNOSIS — D631 Anemia in chronic kidney disease: Secondary | ICD-10-CM | POA: Diagnosis not present

## 2020-12-22 DIAGNOSIS — I132 Hypertensive heart and chronic kidney disease with heart failure and with stage 5 chronic kidney disease, or end stage renal disease: Secondary | ICD-10-CM | POA: Diagnosis not present

## 2020-12-22 DIAGNOSIS — E1122 Type 2 diabetes mellitus with diabetic chronic kidney disease: Secondary | ICD-10-CM | POA: Diagnosis not present

## 2020-12-22 DIAGNOSIS — N186 End stage renal disease: Secondary | ICD-10-CM | POA: Diagnosis not present

## 2020-12-22 DIAGNOSIS — E1151 Type 2 diabetes mellitus with diabetic peripheral angiopathy without gangrene: Secondary | ICD-10-CM | POA: Diagnosis not present

## 2020-12-22 DIAGNOSIS — I5032 Chronic diastolic (congestive) heart failure: Secondary | ICD-10-CM | POA: Diagnosis not present

## 2020-12-22 DIAGNOSIS — E1152 Type 2 diabetes mellitus with diabetic peripheral angiopathy with gangrene: Secondary | ICD-10-CM | POA: Diagnosis not present

## 2020-12-22 DIAGNOSIS — J189 Pneumonia, unspecified organism: Secondary | ICD-10-CM

## 2020-12-22 DIAGNOSIS — Z9981 Dependence on supplemental oxygen: Secondary | ICD-10-CM | POA: Diagnosis not present

## 2020-12-22 DIAGNOSIS — L97511 Non-pressure chronic ulcer of other part of right foot limited to breakdown of skin: Secondary | ICD-10-CM | POA: Diagnosis not present

## 2020-12-22 DIAGNOSIS — Z794 Long term (current) use of insulin: Secondary | ICD-10-CM | POA: Diagnosis not present

## 2020-12-23 DIAGNOSIS — T8743 Infection of amputation stump, right lower extremity: Secondary | ICD-10-CM | POA: Diagnosis not present

## 2020-12-23 DIAGNOSIS — Z992 Dependence on renal dialysis: Secondary | ICD-10-CM | POA: Diagnosis not present

## 2020-12-23 DIAGNOSIS — N186 End stage renal disease: Secondary | ICD-10-CM | POA: Diagnosis not present

## 2020-12-25 DIAGNOSIS — E1151 Type 2 diabetes mellitus with diabetic peripheral angiopathy without gangrene: Secondary | ICD-10-CM | POA: Diagnosis not present

## 2020-12-25 DIAGNOSIS — L97511 Non-pressure chronic ulcer of other part of right foot limited to breakdown of skin: Secondary | ICD-10-CM | POA: Diagnosis not present

## 2020-12-25 DIAGNOSIS — N186 End stage renal disease: Secondary | ICD-10-CM | POA: Diagnosis not present

## 2020-12-25 DIAGNOSIS — Z794 Long term (current) use of insulin: Secondary | ICD-10-CM | POA: Diagnosis not present

## 2020-12-25 DIAGNOSIS — E1152 Type 2 diabetes mellitus with diabetic peripheral angiopathy with gangrene: Secondary | ICD-10-CM | POA: Diagnosis not present

## 2020-12-25 DIAGNOSIS — I132 Hypertensive heart and chronic kidney disease with heart failure and with stage 5 chronic kidney disease, or end stage renal disease: Secondary | ICD-10-CM | POA: Diagnosis not present

## 2020-12-25 DIAGNOSIS — D631 Anemia in chronic kidney disease: Secondary | ICD-10-CM | POA: Diagnosis not present

## 2020-12-25 DIAGNOSIS — Z9981 Dependence on supplemental oxygen: Secondary | ICD-10-CM | POA: Diagnosis not present

## 2020-12-25 DIAGNOSIS — E1122 Type 2 diabetes mellitus with diabetic chronic kidney disease: Secondary | ICD-10-CM | POA: Diagnosis not present

## 2020-12-25 DIAGNOSIS — I5032 Chronic diastolic (congestive) heart failure: Secondary | ICD-10-CM | POA: Diagnosis not present

## 2020-12-25 DIAGNOSIS — E11621 Type 2 diabetes mellitus with foot ulcer: Secondary | ICD-10-CM | POA: Diagnosis not present

## 2020-12-25 DIAGNOSIS — Z992 Dependence on renal dialysis: Secondary | ICD-10-CM | POA: Diagnosis not present

## 2020-12-26 DIAGNOSIS — T8743 Infection of amputation stump, right lower extremity: Secondary | ICD-10-CM | POA: Diagnosis not present

## 2020-12-26 DIAGNOSIS — J45909 Unspecified asthma, uncomplicated: Secondary | ICD-10-CM | POA: Diagnosis not present

## 2020-12-26 DIAGNOSIS — M869 Osteomyelitis, unspecified: Secondary | ICD-10-CM | POA: Diagnosis not present

## 2020-12-26 DIAGNOSIS — Z992 Dependence on renal dialysis: Secondary | ICD-10-CM | POA: Diagnosis not present

## 2020-12-26 DIAGNOSIS — I5040 Unspecified combined systolic (congestive) and diastolic (congestive) heart failure: Secondary | ICD-10-CM | POA: Diagnosis not present

## 2020-12-26 DIAGNOSIS — N186 End stage renal disease: Secondary | ICD-10-CM | POA: Diagnosis not present

## 2020-12-27 DIAGNOSIS — Z794 Long term (current) use of insulin: Secondary | ICD-10-CM | POA: Diagnosis not present

## 2020-12-27 DIAGNOSIS — E1122 Type 2 diabetes mellitus with diabetic chronic kidney disease: Secondary | ICD-10-CM | POA: Diagnosis not present

## 2020-12-27 DIAGNOSIS — N186 End stage renal disease: Secondary | ICD-10-CM | POA: Diagnosis not present

## 2020-12-27 DIAGNOSIS — E1151 Type 2 diabetes mellitus with diabetic peripheral angiopathy without gangrene: Secondary | ICD-10-CM | POA: Diagnosis not present

## 2020-12-27 DIAGNOSIS — E1152 Type 2 diabetes mellitus with diabetic peripheral angiopathy with gangrene: Secondary | ICD-10-CM | POA: Diagnosis not present

## 2020-12-27 DIAGNOSIS — Z992 Dependence on renal dialysis: Secondary | ICD-10-CM | POA: Diagnosis not present

## 2020-12-27 DIAGNOSIS — I132 Hypertensive heart and chronic kidney disease with heart failure and with stage 5 chronic kidney disease, or end stage renal disease: Secondary | ICD-10-CM | POA: Diagnosis not present

## 2020-12-27 DIAGNOSIS — E11621 Type 2 diabetes mellitus with foot ulcer: Secondary | ICD-10-CM | POA: Diagnosis not present

## 2020-12-27 DIAGNOSIS — L97511 Non-pressure chronic ulcer of other part of right foot limited to breakdown of skin: Secondary | ICD-10-CM | POA: Diagnosis not present

## 2020-12-27 DIAGNOSIS — Z9981 Dependence on supplemental oxygen: Secondary | ICD-10-CM | POA: Diagnosis not present

## 2020-12-27 DIAGNOSIS — D631 Anemia in chronic kidney disease: Secondary | ICD-10-CM | POA: Diagnosis not present

## 2020-12-27 DIAGNOSIS — I5032 Chronic diastolic (congestive) heart failure: Secondary | ICD-10-CM | POA: Diagnosis not present

## 2020-12-28 DIAGNOSIS — T8743 Infection of amputation stump, right lower extremity: Secondary | ICD-10-CM | POA: Diagnosis not present

## 2020-12-28 DIAGNOSIS — Z992 Dependence on renal dialysis: Secondary | ICD-10-CM | POA: Diagnosis not present

## 2020-12-28 DIAGNOSIS — N186 End stage renal disease: Secondary | ICD-10-CM | POA: Diagnosis not present

## 2020-12-29 ENCOUNTER — Other Ambulatory Visit: Payer: Self-pay

## 2020-12-29 ENCOUNTER — Encounter (INDEPENDENT_AMBULATORY_CARE_PROVIDER_SITE_OTHER): Payer: Self-pay

## 2020-12-29 ENCOUNTER — Ambulatory Visit (INDEPENDENT_AMBULATORY_CARE_PROVIDER_SITE_OTHER): Payer: Medicare Other | Admitting: Podiatry

## 2020-12-29 DIAGNOSIS — I96 Gangrene, not elsewhere classified: Secondary | ICD-10-CM

## 2021-01-01 DIAGNOSIS — E1122 Type 2 diabetes mellitus with diabetic chronic kidney disease: Secondary | ICD-10-CM | POA: Diagnosis not present

## 2021-01-01 DIAGNOSIS — N186 End stage renal disease: Secondary | ICD-10-CM | POA: Diagnosis not present

## 2021-01-01 DIAGNOSIS — D631 Anemia in chronic kidney disease: Secondary | ICD-10-CM | POA: Diagnosis not present

## 2021-01-01 DIAGNOSIS — Z9981 Dependence on supplemental oxygen: Secondary | ICD-10-CM | POA: Diagnosis not present

## 2021-01-01 DIAGNOSIS — E1151 Type 2 diabetes mellitus with diabetic peripheral angiopathy without gangrene: Secondary | ICD-10-CM | POA: Diagnosis not present

## 2021-01-01 DIAGNOSIS — Z794 Long term (current) use of insulin: Secondary | ICD-10-CM | POA: Diagnosis not present

## 2021-01-01 DIAGNOSIS — I132 Hypertensive heart and chronic kidney disease with heart failure and with stage 5 chronic kidney disease, or end stage renal disease: Secondary | ICD-10-CM | POA: Diagnosis not present

## 2021-01-01 DIAGNOSIS — E11621 Type 2 diabetes mellitus with foot ulcer: Secondary | ICD-10-CM | POA: Diagnosis not present

## 2021-01-01 DIAGNOSIS — I5032 Chronic diastolic (congestive) heart failure: Secondary | ICD-10-CM | POA: Diagnosis not present

## 2021-01-01 DIAGNOSIS — Z992 Dependence on renal dialysis: Secondary | ICD-10-CM | POA: Diagnosis not present

## 2021-01-01 DIAGNOSIS — E1152 Type 2 diabetes mellitus with diabetic peripheral angiopathy with gangrene: Secondary | ICD-10-CM | POA: Diagnosis not present

## 2021-01-01 DIAGNOSIS — L97511 Non-pressure chronic ulcer of other part of right foot limited to breakdown of skin: Secondary | ICD-10-CM | POA: Diagnosis not present

## 2021-01-02 ENCOUNTER — Ambulatory Visit: Payer: Medicare Other | Admitting: Infectious Diseases

## 2021-01-02 DIAGNOSIS — T8743 Infection of amputation stump, right lower extremity: Secondary | ICD-10-CM | POA: Diagnosis not present

## 2021-01-02 DIAGNOSIS — Z992 Dependence on renal dialysis: Secondary | ICD-10-CM | POA: Diagnosis not present

## 2021-01-02 DIAGNOSIS — N186 End stage renal disease: Secondary | ICD-10-CM | POA: Diagnosis not present

## 2021-01-02 DIAGNOSIS — N2581 Secondary hyperparathyroidism of renal origin: Secondary | ICD-10-CM | POA: Diagnosis not present

## 2021-01-02 NOTE — Progress Notes (Signed)
   Subjective:  Patient recent hospital admission for PAD with gangrene of the right foot refused below-knee amputation and patient presents today status post right fifth toe amputation with I&D. DOS: 10/21/2020.  Patient continues to have wound care dressed the foot.  She has no pain associated to the area.  She presents for outpatient follow-up  Past Medical History:  Diagnosis Date   (HFpEF) heart failure with preserved ejection fraction ([place])    a. 11/2014 Echo: EF 50-55%, Gr1 DD; b. 06/2016 Echo: EF 60-65%, no rwma, mod dil LA, nl RV, triv eff.   Anxiety    Asthma    Bronchitis    CHF (congestive heart failure) (HCC)    Chronic cough    Chronic respiratory failure with hypoxia and hypercapnia ([place])    a. Home O2.  (2-4L)   CKD (chronic kidney disease), stage III (HCC)    Depression    Diabetes mellitus without complication (HCC)    Diverticulosis    Dyspnea    Dysrhythmia    per pt, no arrythmias   Environmental and seasonal allergies    Extreme obesity with alveolar hypoventilation (HCC)    Hypertension    Iron deficiency anemia    a. chronic blood loss->heavy menses.   Kidney failure    dialysis tues/thur/sat   ... left arm shunt   Orthopnea    Oxygen dependent    3l/min   Pericardial effusion    a. 11/2014 CT Chest: Mod effusion; b. 11/2014 Echo: small to mod circumferential effusion. No hemodynamic compromise.   Pneumonia    history   Poorly controlled diabetes mellitus ([place])    a. 11/2014 A1c 13.2.   Swelling of both lower extremities          Objective/Physical Exam Please see attached photos.  I explained to the patient that the foot is completely necrotic and ischemic.  It is no longer viable.  There is no malodor.  Minimal drainage.  The foot is mostly consisting of a dry stable gangrene.  Open wound along the plantar aspect of the foot with tracking.  Assessment: 1.  Right foot gangrene   Plan of Care:  1. Patient was evaluated. 2.  I did my best to  explain that the foot is irreversibly ischemic and past the point of salvage.  Below-knee amputation was recommended today however the patient continues to refuse 3.  Continue Betadine wet-to-dry dressings as per home nurse 4.  At the moment there is nothing further I can provide for the patient.  Recommend that she goes immediately to the hospital if she begins to feel any signs or symptoms of infection which would include but not limited to fever, chills, nausea or vomiting, increase in odor, increase in redness of the leg. 5.  Return to clinic as needed   Edrick Kins, DPM [facility]  Dr. Edrick Kins, DPM    [address], [place] [address]                Office [phone]  Fax [phone]

## 2021-01-03 DIAGNOSIS — Z992 Dependence on renal dialysis: Secondary | ICD-10-CM | POA: Diagnosis not present

## 2021-01-03 DIAGNOSIS — E11621 Type 2 diabetes mellitus with foot ulcer: Secondary | ICD-10-CM | POA: Diagnosis not present

## 2021-01-03 DIAGNOSIS — I132 Hypertensive heart and chronic kidney disease with heart failure and with stage 5 chronic kidney disease, or end stage renal disease: Secondary | ICD-10-CM | POA: Diagnosis not present

## 2021-01-03 DIAGNOSIS — L97511 Non-pressure chronic ulcer of other part of right foot limited to breakdown of skin: Secondary | ICD-10-CM | POA: Diagnosis not present

## 2021-01-03 DIAGNOSIS — N186 End stage renal disease: Secondary | ICD-10-CM | POA: Diagnosis not present

## 2021-01-03 DIAGNOSIS — I5032 Chronic diastolic (congestive) heart failure: Secondary | ICD-10-CM | POA: Diagnosis not present

## 2021-01-03 DIAGNOSIS — Z9981 Dependence on supplemental oxygen: Secondary | ICD-10-CM | POA: Diagnosis not present

## 2021-01-03 DIAGNOSIS — E1151 Type 2 diabetes mellitus with diabetic peripheral angiopathy without gangrene: Secondary | ICD-10-CM | POA: Diagnosis not present

## 2021-01-03 DIAGNOSIS — D631 Anemia in chronic kidney disease: Secondary | ICD-10-CM | POA: Diagnosis not present

## 2021-01-03 DIAGNOSIS — J449 Chronic obstructive pulmonary disease, unspecified: Secondary | ICD-10-CM | POA: Diagnosis not present

## 2021-01-03 DIAGNOSIS — E1152 Type 2 diabetes mellitus with diabetic peripheral angiopathy with gangrene: Secondary | ICD-10-CM | POA: Diagnosis not present

## 2021-01-03 DIAGNOSIS — Z794 Long term (current) use of insulin: Secondary | ICD-10-CM | POA: Diagnosis not present

## 2021-01-03 DIAGNOSIS — T8743 Infection of amputation stump, right lower extremity: Secondary | ICD-10-CM | POA: Diagnosis not present

## 2021-01-03 DIAGNOSIS — E1122 Type 2 diabetes mellitus with diabetic chronic kidney disease: Secondary | ICD-10-CM | POA: Diagnosis not present

## 2021-01-04 DIAGNOSIS — T8743 Infection of amputation stump, right lower extremity: Secondary | ICD-10-CM | POA: Diagnosis not present

## 2021-01-04 DIAGNOSIS — Z992 Dependence on renal dialysis: Secondary | ICD-10-CM | POA: Diagnosis not present

## 2021-01-04 DIAGNOSIS — N186 End stage renal disease: Secondary | ICD-10-CM | POA: Diagnosis not present

## 2021-01-05 DIAGNOSIS — Z9981 Dependence on supplemental oxygen: Secondary | ICD-10-CM | POA: Diagnosis not present

## 2021-01-05 DIAGNOSIS — E1122 Type 2 diabetes mellitus with diabetic chronic kidney disease: Secondary | ICD-10-CM | POA: Diagnosis not present

## 2021-01-05 DIAGNOSIS — E11621 Type 2 diabetes mellitus with foot ulcer: Secondary | ICD-10-CM | POA: Diagnosis not present

## 2021-01-05 DIAGNOSIS — Z992 Dependence on renal dialysis: Secondary | ICD-10-CM | POA: Diagnosis not present

## 2021-01-05 DIAGNOSIS — D631 Anemia in chronic kidney disease: Secondary | ICD-10-CM | POA: Diagnosis not present

## 2021-01-05 DIAGNOSIS — I5032 Chronic diastolic (congestive) heart failure: Secondary | ICD-10-CM | POA: Diagnosis not present

## 2021-01-05 DIAGNOSIS — L97511 Non-pressure chronic ulcer of other part of right foot limited to breakdown of skin: Secondary | ICD-10-CM | POA: Diagnosis not present

## 2021-01-05 DIAGNOSIS — E1151 Type 2 diabetes mellitus with diabetic peripheral angiopathy without gangrene: Secondary | ICD-10-CM | POA: Diagnosis not present

## 2021-01-05 DIAGNOSIS — E1152 Type 2 diabetes mellitus with diabetic peripheral angiopathy with gangrene: Secondary | ICD-10-CM | POA: Diagnosis not present

## 2021-01-05 DIAGNOSIS — I132 Hypertensive heart and chronic kidney disease with heart failure and with stage 5 chronic kidney disease, or end stage renal disease: Secondary | ICD-10-CM | POA: Diagnosis not present

## 2021-01-05 DIAGNOSIS — Z794 Long term (current) use of insulin: Secondary | ICD-10-CM | POA: Diagnosis not present

## 2021-01-05 DIAGNOSIS — N186 End stage renal disease: Secondary | ICD-10-CM | POA: Diagnosis not present

## 2021-01-06 DIAGNOSIS — N186 End stage renal disease: Secondary | ICD-10-CM | POA: Diagnosis not present

## 2021-01-06 DIAGNOSIS — T8743 Infection of amputation stump, right lower extremity: Secondary | ICD-10-CM | POA: Diagnosis not present

## 2021-01-06 DIAGNOSIS — Z992 Dependence on renal dialysis: Secondary | ICD-10-CM | POA: Diagnosis not present

## 2021-01-07 DIAGNOSIS — J449 Chronic obstructive pulmonary disease, unspecified: Secondary | ICD-10-CM | POA: Diagnosis not present

## 2021-01-07 DIAGNOSIS — I5030 Unspecified diastolic (congestive) heart failure: Secondary | ICD-10-CM | POA: Diagnosis not present

## 2021-01-07 DIAGNOSIS — J9610 Chronic respiratory failure, unspecified whether with hypoxia or hypercapnia: Secondary | ICD-10-CM | POA: Diagnosis not present

## 2021-01-07 DIAGNOSIS — I96 Gangrene, not elsewhere classified: Secondary | ICD-10-CM | POA: Diagnosis not present

## 2021-01-07 DIAGNOSIS — L97509 Non-pressure chronic ulcer of other part of unspecified foot with unspecified severity: Secondary | ICD-10-CM | POA: Diagnosis not present

## 2021-01-08 DIAGNOSIS — D631 Anemia in chronic kidney disease: Secondary | ICD-10-CM | POA: Diagnosis not present

## 2021-01-08 DIAGNOSIS — E1152 Type 2 diabetes mellitus with diabetic peripheral angiopathy with gangrene: Secondary | ICD-10-CM | POA: Diagnosis not present

## 2021-01-08 DIAGNOSIS — I5032 Chronic diastolic (congestive) heart failure: Secondary | ICD-10-CM | POA: Diagnosis not present

## 2021-01-08 DIAGNOSIS — L97511 Non-pressure chronic ulcer of other part of right foot limited to breakdown of skin: Secondary | ICD-10-CM | POA: Diagnosis not present

## 2021-01-08 DIAGNOSIS — Z992 Dependence on renal dialysis: Secondary | ICD-10-CM | POA: Diagnosis not present

## 2021-01-08 DIAGNOSIS — E11621 Type 2 diabetes mellitus with foot ulcer: Secondary | ICD-10-CM | POA: Diagnosis not present

## 2021-01-08 DIAGNOSIS — E1122 Type 2 diabetes mellitus with diabetic chronic kidney disease: Secondary | ICD-10-CM | POA: Diagnosis not present

## 2021-01-08 DIAGNOSIS — Z9981 Dependence on supplemental oxygen: Secondary | ICD-10-CM | POA: Diagnosis not present

## 2021-01-08 DIAGNOSIS — Z794 Long term (current) use of insulin: Secondary | ICD-10-CM | POA: Diagnosis not present

## 2021-01-08 DIAGNOSIS — I132 Hypertensive heart and chronic kidney disease with heart failure and with stage 5 chronic kidney disease, or end stage renal disease: Secondary | ICD-10-CM | POA: Diagnosis not present

## 2021-01-08 DIAGNOSIS — E1151 Type 2 diabetes mellitus with diabetic peripheral angiopathy without gangrene: Secondary | ICD-10-CM | POA: Diagnosis not present

## 2021-01-08 DIAGNOSIS — N186 End stage renal disease: Secondary | ICD-10-CM | POA: Diagnosis not present

## 2021-01-09 ENCOUNTER — Telehealth: Payer: Self-pay | Admitting: Nurse Practitioner

## 2021-01-09 DIAGNOSIS — N186 End stage renal disease: Secondary | ICD-10-CM | POA: Diagnosis not present

## 2021-01-09 DIAGNOSIS — N2581 Secondary hyperparathyroidism of renal origin: Secondary | ICD-10-CM | POA: Diagnosis not present

## 2021-01-09 DIAGNOSIS — Z992 Dependence on renal dialysis: Secondary | ICD-10-CM | POA: Diagnosis not present

## 2021-01-09 DIAGNOSIS — T8743 Infection of amputation stump, right lower extremity: Secondary | ICD-10-CM | POA: Diagnosis not present

## 2021-01-10 DIAGNOSIS — Z9981 Dependence on supplemental oxygen: Secondary | ICD-10-CM | POA: Diagnosis not present

## 2021-01-10 DIAGNOSIS — L97511 Non-pressure chronic ulcer of other part of right foot limited to breakdown of skin: Secondary | ICD-10-CM | POA: Diagnosis not present

## 2021-01-10 DIAGNOSIS — Z794 Long term (current) use of insulin: Secondary | ICD-10-CM | POA: Diagnosis not present

## 2021-01-10 DIAGNOSIS — E1151 Type 2 diabetes mellitus with diabetic peripheral angiopathy without gangrene: Secondary | ICD-10-CM | POA: Diagnosis not present

## 2021-01-10 DIAGNOSIS — E1122 Type 2 diabetes mellitus with diabetic chronic kidney disease: Secondary | ICD-10-CM | POA: Diagnosis not present

## 2021-01-10 DIAGNOSIS — Z992 Dependence on renal dialysis: Secondary | ICD-10-CM | POA: Diagnosis not present

## 2021-01-10 DIAGNOSIS — D631 Anemia in chronic kidney disease: Secondary | ICD-10-CM | POA: Diagnosis not present

## 2021-01-10 DIAGNOSIS — N186 End stage renal disease: Secondary | ICD-10-CM | POA: Diagnosis not present

## 2021-01-10 DIAGNOSIS — I5032 Chronic diastolic (congestive) heart failure: Secondary | ICD-10-CM | POA: Diagnosis not present

## 2021-01-10 DIAGNOSIS — E1152 Type 2 diabetes mellitus with diabetic peripheral angiopathy with gangrene: Secondary | ICD-10-CM | POA: Diagnosis not present

## 2021-01-10 DIAGNOSIS — E11621 Type 2 diabetes mellitus with foot ulcer: Secondary | ICD-10-CM | POA: Diagnosis not present

## 2021-01-10 DIAGNOSIS — I132 Hypertensive heart and chronic kidney disease with heart failure and with stage 5 chronic kidney disease, or end stage renal disease: Secondary | ICD-10-CM | POA: Diagnosis not present

## 2021-01-11 DIAGNOSIS — Z794 Long term (current) use of insulin: Secondary | ICD-10-CM | POA: Diagnosis not present

## 2021-01-11 DIAGNOSIS — Z9981 Dependence on supplemental oxygen: Secondary | ICD-10-CM | POA: Diagnosis not present

## 2021-01-11 DIAGNOSIS — Z992 Dependence on renal dialysis: Secondary | ICD-10-CM | POA: Diagnosis not present

## 2021-01-11 DIAGNOSIS — L97511 Non-pressure chronic ulcer of other part of right foot limited to breakdown of skin: Secondary | ICD-10-CM | POA: Diagnosis not present

## 2021-01-11 DIAGNOSIS — I132 Hypertensive heart and chronic kidney disease with heart failure and with stage 5 chronic kidney disease, or end stage renal disease: Secondary | ICD-10-CM | POA: Diagnosis not present

## 2021-01-11 DIAGNOSIS — E1122 Type 2 diabetes mellitus with diabetic chronic kidney disease: Secondary | ICD-10-CM | POA: Diagnosis not present

## 2021-01-11 DIAGNOSIS — E1151 Type 2 diabetes mellitus with diabetic peripheral angiopathy without gangrene: Secondary | ICD-10-CM | POA: Diagnosis not present

## 2021-01-11 DIAGNOSIS — D631 Anemia in chronic kidney disease: Secondary | ICD-10-CM | POA: Diagnosis not present

## 2021-01-11 DIAGNOSIS — I5032 Chronic diastolic (congestive) heart failure: Secondary | ICD-10-CM | POA: Diagnosis not present

## 2021-01-11 DIAGNOSIS — N186 End stage renal disease: Secondary | ICD-10-CM | POA: Diagnosis not present

## 2021-01-11 DIAGNOSIS — E11621 Type 2 diabetes mellitus with foot ulcer: Secondary | ICD-10-CM | POA: Diagnosis not present

## 2021-01-11 DIAGNOSIS — T8743 Infection of amputation stump, right lower extremity: Secondary | ICD-10-CM | POA: Diagnosis not present

## 2021-01-11 DIAGNOSIS — E1152 Type 2 diabetes mellitus with diabetic peripheral angiopathy with gangrene: Secondary | ICD-10-CM | POA: Diagnosis not present

## 2021-01-12 DIAGNOSIS — J9610 Chronic respiratory failure, unspecified whether with hypoxia or hypercapnia: Secondary | ICD-10-CM | POA: Diagnosis not present

## 2021-01-12 DIAGNOSIS — J449 Chronic obstructive pulmonary disease, unspecified: Secondary | ICD-10-CM | POA: Diagnosis not present

## 2021-01-13 ENCOUNTER — Other Ambulatory Visit
Admission: RE | Admit: 2021-01-13 | Discharge: 2021-01-13 | Disposition: A | Discharge status: Home / Self Care | Payer: Medicare Other | Source: Ambulatory Visit | Attending: Nephrology | Admitting: Nephrology

## 2021-01-13 DIAGNOSIS — N186 End stage renal disease: Secondary | ICD-10-CM | POA: Insufficient documentation

## 2021-01-13 DIAGNOSIS — T8743 Infection of amputation stump, right lower extremity: Secondary | ICD-10-CM | POA: Diagnosis not present

## 2021-01-13 DIAGNOSIS — Z992 Dependence on renal dialysis: Secondary | ICD-10-CM | POA: Diagnosis not present

## 2021-01-13 LAB — CBC WITH DIFFERENTIAL/PLATELET
Abs Immature Granulocytes: 0.03 10*3/uL (ref 0.00–0.07)
Basophils Absolute: 0 10*3/uL (ref 0.0–0.1)
Basophils Relative: 1 %
Eosinophils Absolute: 0.1 10*3/uL (ref 0.0–0.5)
Eosinophils Relative: 2 %
HCT: 32.3 % — ABNORMAL LOW (ref 36.0–46.0)
Hemoglobin: 9.7 g/dL — ABNORMAL LOW (ref 12.0–15.0)
Immature Granulocytes: 1 %
Lymphocytes Relative: 17 %
Lymphs Abs: 0.8 10*3/uL (ref 0.7–4.0)
MCH: 28.8 pg (ref 26.0–34.0)
MCHC: 30 g/dL (ref 30.0–36.0)
MCV: 95.8 fL (ref 80.0–100.0)
Monocytes Absolute: 0.4 10*3/uL (ref 0.1–1.0)
Monocytes Relative: 8 %
Neutro Abs: 3.6 10*3/uL (ref 1.7–7.7)
Neutrophils Relative %: 71 %
Platelets: 249 10*3/uL (ref 150–400)
RBC: 3.37 MIL/uL — ABNORMAL LOW (ref 3.87–5.11)
RDW: 14.5 % (ref 11.5–15.5)
WBC: 5 10*3/uL (ref 4.0–10.5)
nRBC: 0 % (ref 0.0–0.2)

## 2021-01-13 LAB — C-REACTIVE PROTEIN: CRP: 4.7 mg/dL — ABNORMAL HIGH (ref <1.0)

## 2021-01-14 DIAGNOSIS — Z794 Long term (current) use of insulin: Secondary | ICD-10-CM | POA: Diagnosis not present

## 2021-01-14 DIAGNOSIS — E1151 Type 2 diabetes mellitus with diabetic peripheral angiopathy without gangrene: Secondary | ICD-10-CM | POA: Diagnosis not present

## 2021-01-14 DIAGNOSIS — L97511 Non-pressure chronic ulcer of other part of right foot limited to breakdown of skin: Secondary | ICD-10-CM | POA: Diagnosis not present

## 2021-01-14 DIAGNOSIS — Z9981 Dependence on supplemental oxygen: Secondary | ICD-10-CM | POA: Diagnosis not present

## 2021-01-14 DIAGNOSIS — I132 Hypertensive heart and chronic kidney disease with heart failure and with stage 5 chronic kidney disease, or end stage renal disease: Secondary | ICD-10-CM | POA: Diagnosis not present

## 2021-01-14 DIAGNOSIS — D631 Anemia in chronic kidney disease: Secondary | ICD-10-CM | POA: Diagnosis not present

## 2021-01-14 DIAGNOSIS — E1152 Type 2 diabetes mellitus with diabetic peripheral angiopathy with gangrene: Secondary | ICD-10-CM | POA: Diagnosis not present

## 2021-01-14 DIAGNOSIS — I5032 Chronic diastolic (congestive) heart failure: Secondary | ICD-10-CM | POA: Diagnosis not present

## 2021-01-14 DIAGNOSIS — Z992 Dependence on renal dialysis: Secondary | ICD-10-CM | POA: Diagnosis not present

## 2021-01-14 DIAGNOSIS — E11621 Type 2 diabetes mellitus with foot ulcer: Secondary | ICD-10-CM | POA: Diagnosis not present

## 2021-01-14 DIAGNOSIS — N186 End stage renal disease: Secondary | ICD-10-CM | POA: Diagnosis not present

## 2021-01-14 DIAGNOSIS — E1122 Type 2 diabetes mellitus with diabetic chronic kidney disease: Secondary | ICD-10-CM | POA: Diagnosis not present

## 2021-01-15 ENCOUNTER — Ambulatory Visit
Admission: RE | Admit: 2021-01-15 | Discharge: 2021-01-15 | Disposition: A | Discharge status: Home / Self Care | Payer: Medicare Other | Source: Ambulatory Visit | Attending: Nurse Practitioner | Admitting: Nurse Practitioner

## 2021-01-15 DIAGNOSIS — J189 Pneumonia, unspecified organism: Secondary | ICD-10-CM | POA: Insufficient documentation

## 2021-01-15 DIAGNOSIS — J449 Chronic obstructive pulmonary disease, unspecified: Secondary | ICD-10-CM | POA: Diagnosis not present

## 2021-01-15 DIAGNOSIS — E1165 Type 2 diabetes mellitus with hyperglycemia: Secondary | ICD-10-CM | POA: Diagnosis not present

## 2021-01-15 DIAGNOSIS — I5032 Chronic diastolic (congestive) heart failure: Secondary | ICD-10-CM | POA: Diagnosis not present

## 2021-01-15 DIAGNOSIS — L97511 Non-pressure chronic ulcer of other part of right foot limited to breakdown of skin: Secondary | ICD-10-CM | POA: Diagnosis not present

## 2021-01-15 DIAGNOSIS — J90 Pleural effusion, not elsewhere classified: Secondary | ICD-10-CM | POA: Diagnosis not present

## 2021-01-15 DIAGNOSIS — Z9981 Dependence on supplemental oxygen: Secondary | ICD-10-CM | POA: Diagnosis not present

## 2021-01-15 DIAGNOSIS — E1152 Type 2 diabetes mellitus with diabetic peripheral angiopathy with gangrene: Secondary | ICD-10-CM | POA: Diagnosis not present

## 2021-01-15 DIAGNOSIS — Z992 Dependence on renal dialysis: Secondary | ICD-10-CM | POA: Diagnosis not present

## 2021-01-15 DIAGNOSIS — E1151 Type 2 diabetes mellitus with diabetic peripheral angiopathy without gangrene: Secondary | ICD-10-CM | POA: Diagnosis not present

## 2021-01-15 DIAGNOSIS — N186 End stage renal disease: Secondary | ICD-10-CM | POA: Diagnosis not present

## 2021-01-15 DIAGNOSIS — E1122 Type 2 diabetes mellitus with diabetic chronic kidney disease: Secondary | ICD-10-CM | POA: Diagnosis not present

## 2021-01-15 DIAGNOSIS — I132 Hypertensive heart and chronic kidney disease with heart failure and with stage 5 chronic kidney disease, or end stage renal disease: Secondary | ICD-10-CM | POA: Diagnosis not present

## 2021-01-15 DIAGNOSIS — E11621 Type 2 diabetes mellitus with foot ulcer: Secondary | ICD-10-CM | POA: Diagnosis not present

## 2021-01-15 DIAGNOSIS — Z794 Long term (current) use of insulin: Secondary | ICD-10-CM | POA: Diagnosis not present

## 2021-01-15 DIAGNOSIS — D631 Anemia in chronic kidney disease: Secondary | ICD-10-CM | POA: Diagnosis not present

## 2021-01-15 IMAGING — CR DG CHEST 2V
2 series · 2 of 2 positions shown · non-contrast
Comparison: Chest x-ray dated [DATE]

CLINICAL DATA: Pneumonia

EXAM:
CHEST - 2 VIEW

[chest pa]
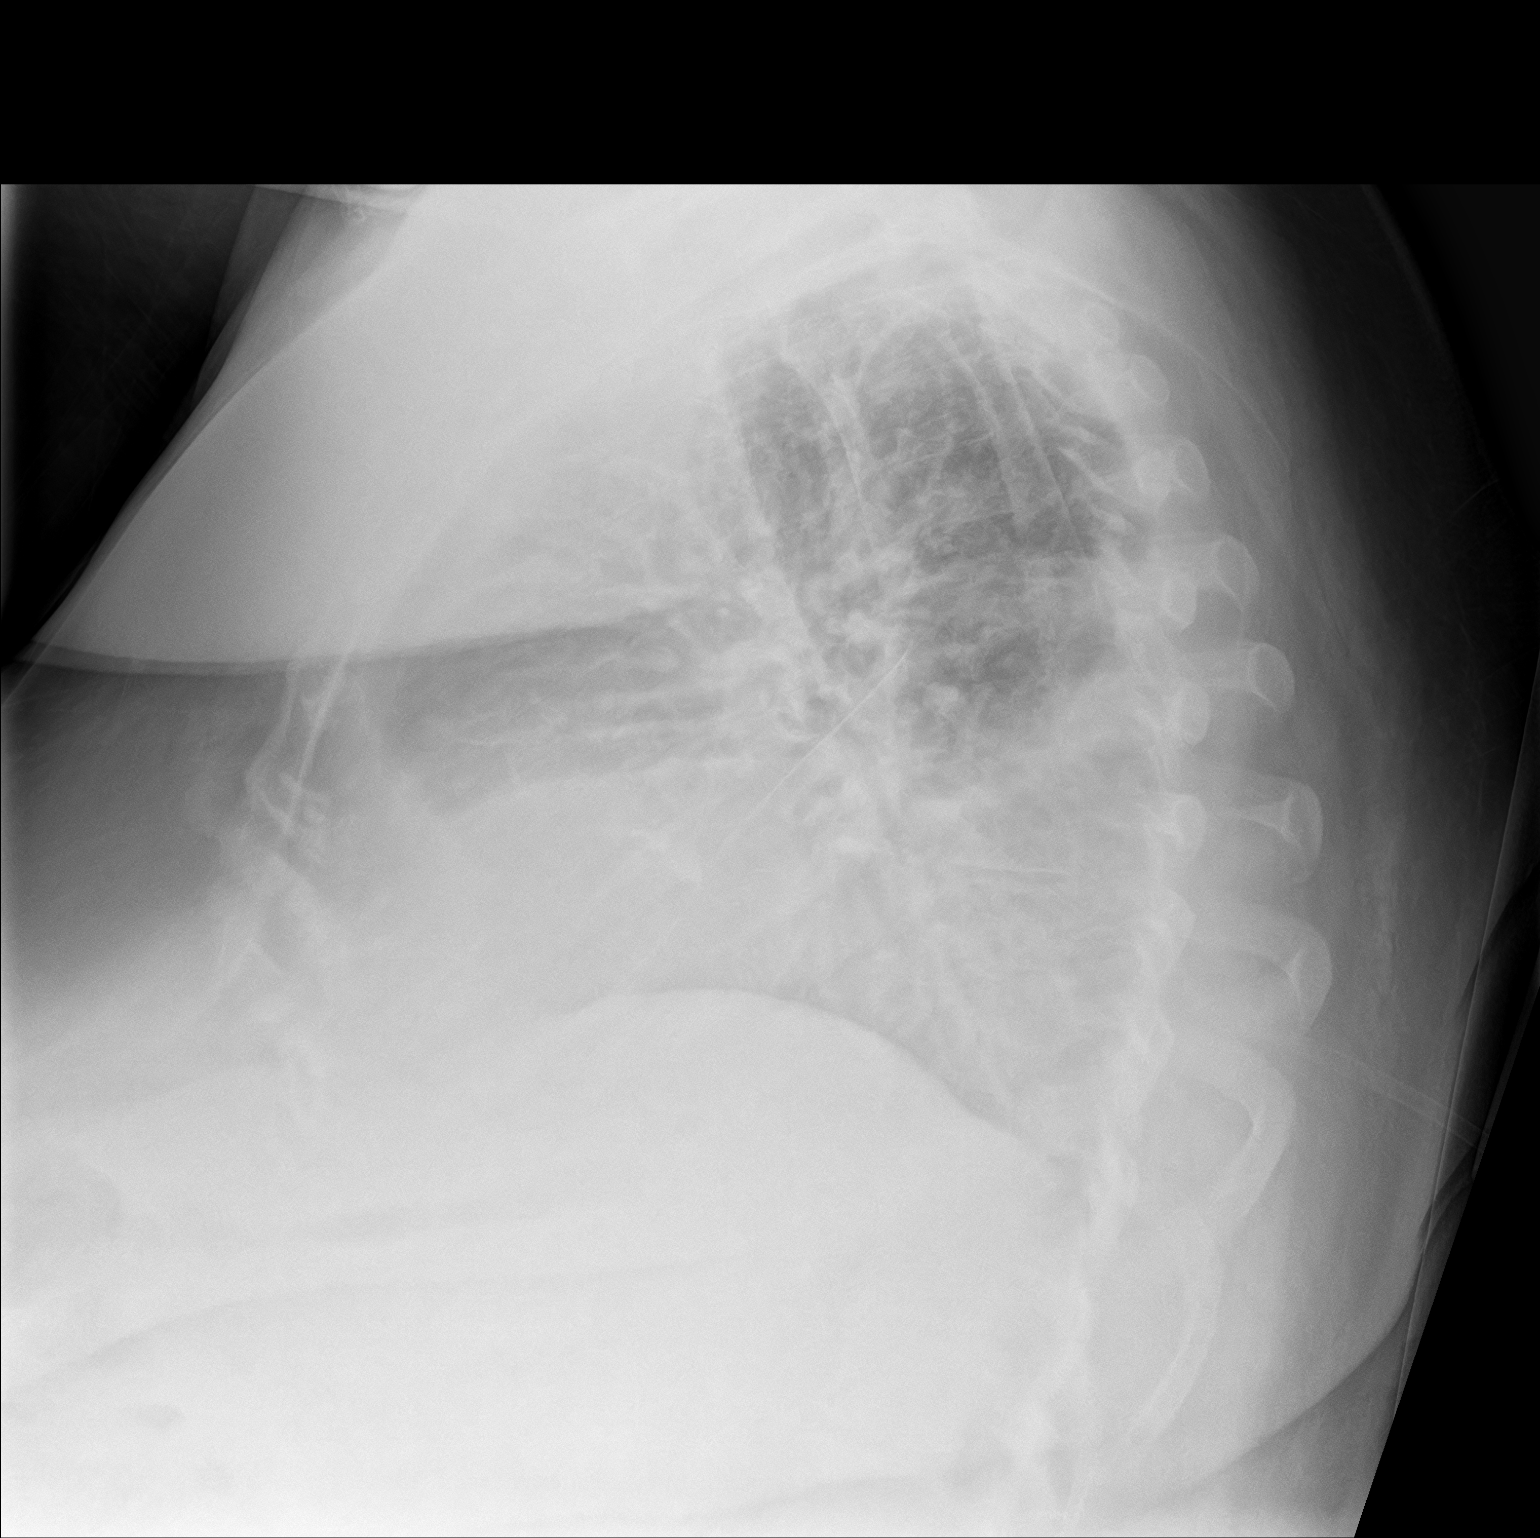

[chest ap]
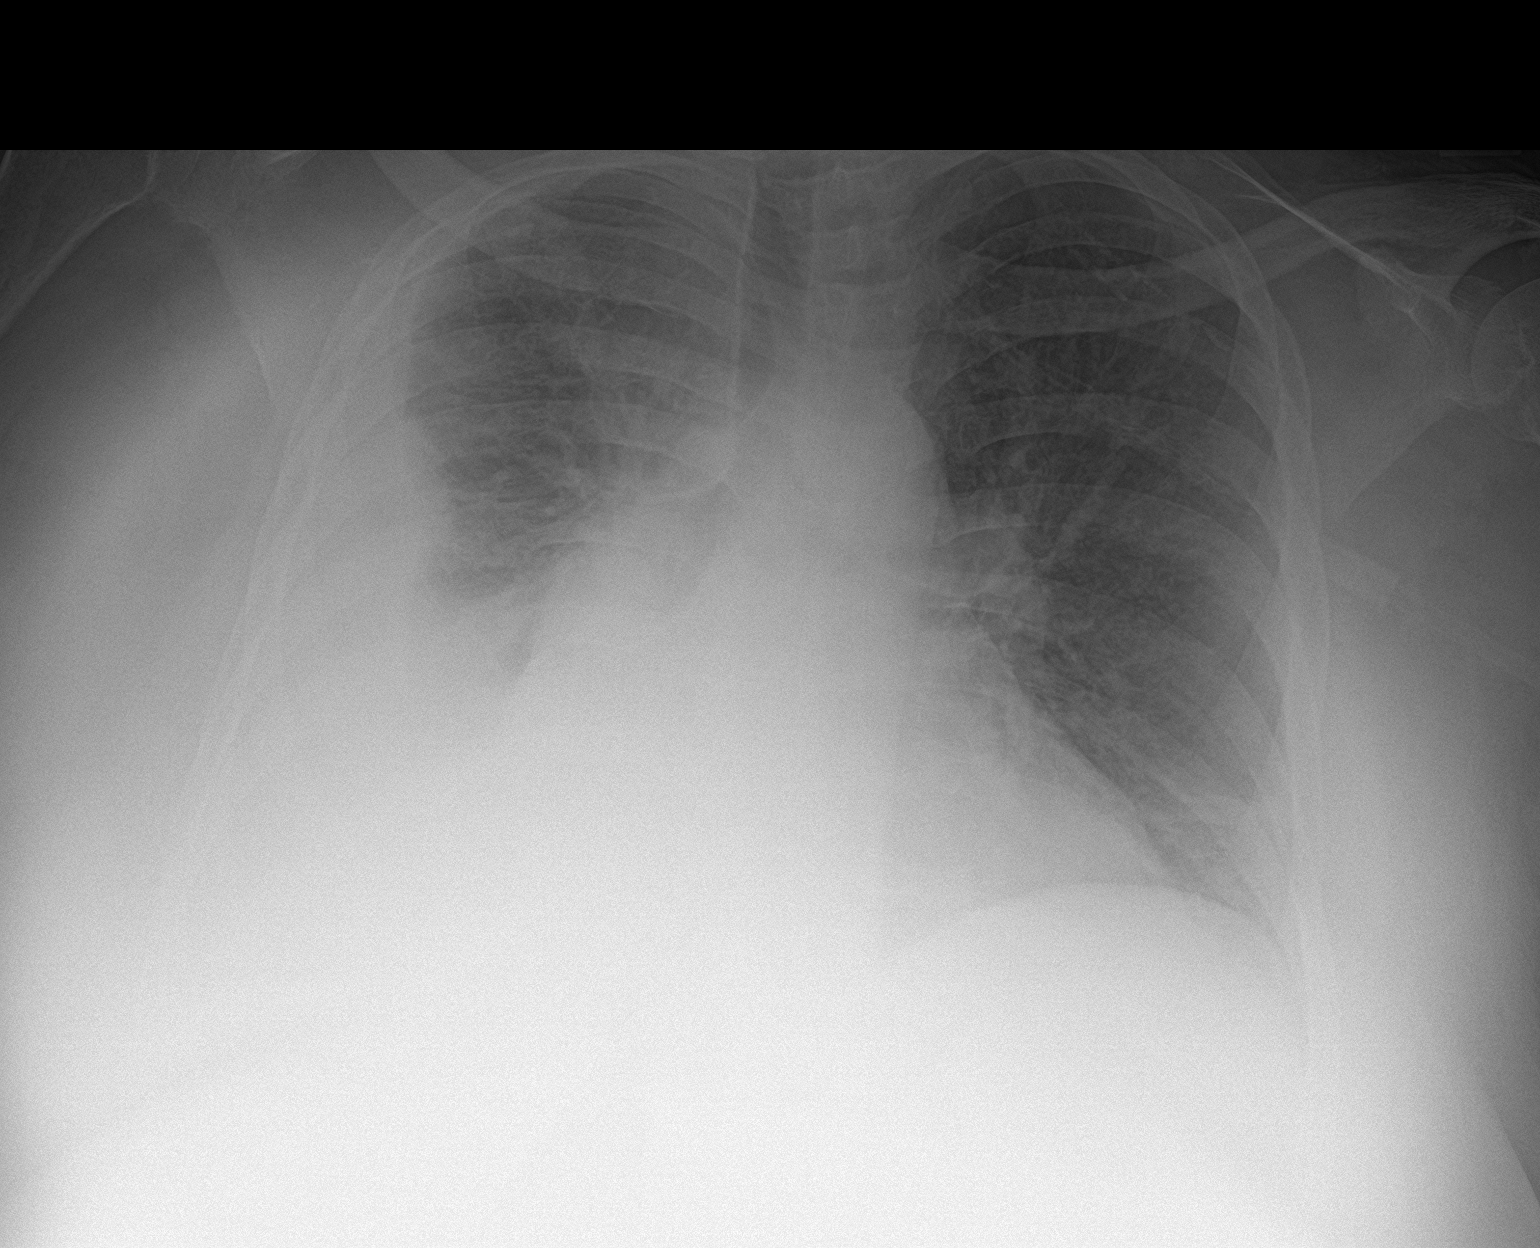

[2 of 2 positions shown; findings below may reference images not displayed]

FINDINGS: Visualized cardiac and mediastinal contours are unchanged. Moderate
right pleural effusion is similar in size to prior exam but now
appears loculated. No new parenchymal process. No evidence of
pneumothorax.
IMPRESSION: Moderate right pleural effusion is similar in size to prior exam but
now appears loculated.

## 2021-01-16 ENCOUNTER — Telehealth: Payer: Self-pay | Admitting: Pulmonary Disease

## 2021-01-16 DIAGNOSIS — N186 End stage renal disease: Secondary | ICD-10-CM | POA: Diagnosis not present

## 2021-01-16 DIAGNOSIS — T8743 Infection of amputation stump, right lower extremity: Secondary | ICD-10-CM | POA: Diagnosis not present

## 2021-01-16 DIAGNOSIS — Z992 Dependence on renal dialysis: Secondary | ICD-10-CM | POA: Diagnosis not present

## 2021-01-16 NOTE — Telephone Encounter (Signed)
Spoke to patient.  Patient stated that PCP ordered a CXR yesterday. CXR showed pleural effusion. She would like Dr. Patsey Berthold to review this prior to 01/24/21 visit.  Dr. Patsey Berthold, please advise. Thanks

## 2021-01-16 NOTE — Telephone Encounter (Signed)
Effusion (fluid) is about the same as it was in September.  We will discuss on follow-up.

## 2021-01-16 NOTE — Telephone Encounter (Signed)
Patient is aware of below recommendations and voiced her understanding.  Nothing further needed.   

## 2021-01-17 DIAGNOSIS — I5032 Chronic diastolic (congestive) heart failure: Secondary | ICD-10-CM | POA: Diagnosis not present

## 2021-01-17 DIAGNOSIS — E1151 Type 2 diabetes mellitus with diabetic peripheral angiopathy without gangrene: Secondary | ICD-10-CM | POA: Diagnosis not present

## 2021-01-17 DIAGNOSIS — E1122 Type 2 diabetes mellitus with diabetic chronic kidney disease: Secondary | ICD-10-CM | POA: Diagnosis not present

## 2021-01-17 DIAGNOSIS — E1152 Type 2 diabetes mellitus with diabetic peripheral angiopathy with gangrene: Secondary | ICD-10-CM | POA: Diagnosis not present

## 2021-01-17 DIAGNOSIS — E11621 Type 2 diabetes mellitus with foot ulcer: Secondary | ICD-10-CM | POA: Diagnosis not present

## 2021-01-17 DIAGNOSIS — L97511 Non-pressure chronic ulcer of other part of right foot limited to breakdown of skin: Secondary | ICD-10-CM | POA: Diagnosis not present

## 2021-01-17 DIAGNOSIS — D631 Anemia in chronic kidney disease: Secondary | ICD-10-CM | POA: Diagnosis not present

## 2021-01-17 DIAGNOSIS — Z992 Dependence on renal dialysis: Secondary | ICD-10-CM | POA: Diagnosis not present

## 2021-01-17 DIAGNOSIS — I132 Hypertensive heart and chronic kidney disease with heart failure and with stage 5 chronic kidney disease, or end stage renal disease: Secondary | ICD-10-CM | POA: Diagnosis not present

## 2021-01-17 DIAGNOSIS — N186 End stage renal disease: Secondary | ICD-10-CM | POA: Diagnosis not present

## 2021-01-17 DIAGNOSIS — Z9981 Dependence on supplemental oxygen: Secondary | ICD-10-CM | POA: Diagnosis not present

## 2021-01-17 DIAGNOSIS — Z794 Long term (current) use of insulin: Secondary | ICD-10-CM | POA: Diagnosis not present

## 2021-01-18 DIAGNOSIS — J449 Chronic obstructive pulmonary disease, unspecified: Secondary | ICD-10-CM | POA: Diagnosis not present

## 2021-01-18 DIAGNOSIS — I5032 Chronic diastolic (congestive) heart failure: Secondary | ICD-10-CM | POA: Diagnosis not present

## 2021-01-18 DIAGNOSIS — J9611 Chronic respiratory failure with hypoxia: Secondary | ICD-10-CM | POA: Diagnosis not present

## 2021-01-19 DIAGNOSIS — D631 Anemia in chronic kidney disease: Secondary | ICD-10-CM | POA: Diagnosis not present

## 2021-01-19 DIAGNOSIS — N186 End stage renal disease: Secondary | ICD-10-CM | POA: Diagnosis not present

## 2021-01-19 DIAGNOSIS — E1151 Type 2 diabetes mellitus with diabetic peripheral angiopathy without gangrene: Secondary | ICD-10-CM | POA: Diagnosis not present

## 2021-01-19 DIAGNOSIS — Z794 Long term (current) use of insulin: Secondary | ICD-10-CM | POA: Diagnosis not present

## 2021-01-19 DIAGNOSIS — Z992 Dependence on renal dialysis: Secondary | ICD-10-CM | POA: Diagnosis not present

## 2021-01-19 DIAGNOSIS — E1122 Type 2 diabetes mellitus with diabetic chronic kidney disease: Secondary | ICD-10-CM | POA: Diagnosis not present

## 2021-01-19 DIAGNOSIS — L97511 Non-pressure chronic ulcer of other part of right foot limited to breakdown of skin: Secondary | ICD-10-CM | POA: Diagnosis not present

## 2021-01-19 DIAGNOSIS — E11621 Type 2 diabetes mellitus with foot ulcer: Secondary | ICD-10-CM | POA: Diagnosis not present

## 2021-01-19 DIAGNOSIS — I132 Hypertensive heart and chronic kidney disease with heart failure and with stage 5 chronic kidney disease, or end stage renal disease: Secondary | ICD-10-CM | POA: Diagnosis not present

## 2021-01-19 DIAGNOSIS — E1152 Type 2 diabetes mellitus with diabetic peripheral angiopathy with gangrene: Secondary | ICD-10-CM | POA: Diagnosis not present

## 2021-01-19 DIAGNOSIS — I5032 Chronic diastolic (congestive) heart failure: Secondary | ICD-10-CM | POA: Diagnosis not present

## 2021-01-19 DIAGNOSIS — Z9981 Dependence on supplemental oxygen: Secondary | ICD-10-CM | POA: Diagnosis not present

## 2021-01-20 DIAGNOSIS — N186 End stage renal disease: Secondary | ICD-10-CM | POA: Diagnosis not present

## 2021-01-20 DIAGNOSIS — Z992 Dependence on renal dialysis: Secondary | ICD-10-CM | POA: Diagnosis not present

## 2021-01-20 DIAGNOSIS — T8743 Infection of amputation stump, right lower extremity: Secondary | ICD-10-CM | POA: Diagnosis not present

## 2021-01-23 DIAGNOSIS — N186 End stage renal disease: Secondary | ICD-10-CM | POA: Diagnosis not present

## 2021-01-23 DIAGNOSIS — Z992 Dependence on renal dialysis: Secondary | ICD-10-CM | POA: Diagnosis not present

## 2021-01-23 DIAGNOSIS — T8743 Infection of amputation stump, right lower extremity: Secondary | ICD-10-CM | POA: Diagnosis not present

## 2021-01-23 DIAGNOSIS — N2581 Secondary hyperparathyroidism of renal origin: Secondary | ICD-10-CM | POA: Diagnosis not present

## 2021-01-24 ENCOUNTER — Other Ambulatory Visit: Payer: Self-pay

## 2021-01-24 ENCOUNTER — Encounter: Payer: Self-pay | Admitting: Pulmonary Disease

## 2021-01-24 ENCOUNTER — Other Ambulatory Visit: Payer: Self-pay | Admitting: Interventional Radiology

## 2021-01-24 ENCOUNTER — Ambulatory Visit
Admission: RE | Admit: 2021-01-24 | Discharge: 2021-01-24 | Disposition: A | Discharge status: Home / Self Care | Payer: Medicare Other | Source: Ambulatory Visit | Attending: Pulmonary Disease | Admitting: Pulmonary Disease

## 2021-01-24 ENCOUNTER — Ambulatory Visit
Admission: RE | Admit: 2021-01-24 | Discharge: 2021-01-24 | Disposition: A | Discharge status: Home / Self Care | Payer: Medicare Other | Source: Ambulatory Visit | Attending: Interventional Radiology | Admitting: Interventional Radiology

## 2021-01-24 ENCOUNTER — Ambulatory Visit (INDEPENDENT_AMBULATORY_CARE_PROVIDER_SITE_OTHER): Payer: Medicare Other | Admitting: Pulmonary Disease

## 2021-01-24 VITALS — BP 110/78 | HR 118 | Temp 97.8°F | Ht 64.5 in | Wt 268.0 lb

## 2021-01-24 DIAGNOSIS — Z9889 Other specified postprocedural states: Secondary | ICD-10-CM

## 2021-01-24 DIAGNOSIS — N186 End stage renal disease: Secondary | ICD-10-CM

## 2021-01-24 DIAGNOSIS — J90 Pleural effusion, not elsewhere classified: Secondary | ICD-10-CM

## 2021-01-24 DIAGNOSIS — E1122 Type 2 diabetes mellitus with diabetic chronic kidney disease: Secondary | ICD-10-CM | POA: Diagnosis not present

## 2021-01-24 DIAGNOSIS — L97511 Non-pressure chronic ulcer of other part of right foot limited to breakdown of skin: Secondary | ICD-10-CM | POA: Diagnosis not present

## 2021-01-24 DIAGNOSIS — J811 Chronic pulmonary edema: Secondary | ICD-10-CM | POA: Diagnosis not present

## 2021-01-24 DIAGNOSIS — Z992 Dependence on renal dialysis: Secondary | ICD-10-CM | POA: Insufficient documentation

## 2021-01-24 DIAGNOSIS — E1151 Type 2 diabetes mellitus with diabetic peripheral angiopathy without gangrene: Secondary | ICD-10-CM | POA: Diagnosis not present

## 2021-01-24 DIAGNOSIS — I132 Hypertensive heart and chronic kidney disease with heart failure and with stage 5 chronic kidney disease, or end stage renal disease: Secondary | ICD-10-CM | POA: Diagnosis not present

## 2021-01-24 DIAGNOSIS — E11621 Type 2 diabetes mellitus with foot ulcer: Secondary | ICD-10-CM | POA: Diagnosis not present

## 2021-01-24 DIAGNOSIS — J9612 Chronic respiratory failure with hypercapnia: Secondary | ICD-10-CM

## 2021-01-24 DIAGNOSIS — E662 Morbid (severe) obesity with alveolar hypoventilation: Secondary | ICD-10-CM | POA: Diagnosis not present

## 2021-01-24 DIAGNOSIS — I96 Gangrene, not elsewhere classified: Secondary | ICD-10-CM | POA: Diagnosis not present

## 2021-01-24 DIAGNOSIS — Z794 Long term (current) use of insulin: Secondary | ICD-10-CM | POA: Diagnosis not present

## 2021-01-24 DIAGNOSIS — J9611 Chronic respiratory failure with hypoxia: Secondary | ICD-10-CM

## 2021-01-24 DIAGNOSIS — J189 Pneumonia, unspecified organism: Secondary | ICD-10-CM | POA: Diagnosis not present

## 2021-01-24 DIAGNOSIS — R0602 Shortness of breath: Secondary | ICD-10-CM | POA: Diagnosis not present

## 2021-01-24 DIAGNOSIS — Z9981 Dependence on supplemental oxygen: Secondary | ICD-10-CM | POA: Diagnosis not present

## 2021-01-24 DIAGNOSIS — D631 Anemia in chronic kidney disease: Secondary | ICD-10-CM | POA: Diagnosis not present

## 2021-01-24 DIAGNOSIS — N289 Disorder of kidney and ureter, unspecified: Secondary | ICD-10-CM | POA: Diagnosis not present

## 2021-01-24 DIAGNOSIS — J4550 Severe persistent asthma, uncomplicated: Secondary | ICD-10-CM

## 2021-01-24 DIAGNOSIS — I5032 Chronic diastolic (congestive) heart failure: Secondary | ICD-10-CM | POA: Diagnosis not present

## 2021-01-24 DIAGNOSIS — E1152 Type 2 diabetes mellitus with diabetic peripheral angiopathy with gangrene: Secondary | ICD-10-CM | POA: Diagnosis not present

## 2021-01-24 LAB — BODY FLUID CELL COUNT WITH DIFFERENTIAL
Eos, Fluid: 0 %
Lymphs, Fluid: 70 %
Monocyte-Macrophage-Serous Fluid: 28 %
Neutrophil Count, Fluid: 2 %
Total Nucleated Cell Count, Fluid: 343 cu mm

## 2021-01-24 LAB — GLUCOSE, PLEURAL OR PERITONEAL FLUID: Glucose, Fluid: 207 mg/dL

## 2021-01-24 LAB — LACTATE DEHYDROGENASE, PLEURAL OR PERITONEAL FLUID: LD, Fluid: 125 U/L — ABNORMAL HIGH (ref 3–23)

## 2021-01-24 LAB — ALBUMIN, PLEURAL OR PERITONEAL FLUID: Albumin, Fluid: 2.1 g/dL

## 2021-01-24 LAB — PROTEIN, PLEURAL OR PERITONEAL FLUID: Total protein, fluid: 4 g/dL

## 2021-01-24 LAB — AMYLASE, PLEURAL OR PERITONEAL FLUID: Amylase, Fluid: 46 U/L

## 2021-01-24 IMAGING — DX DG CHEST 1V PORT
1 series · 1 of 1 positions shown · non-contrast
Comparison: [DATE]

CLINICAL DATA: S/p right side thoracentesis [LL] cc's of fluid was
removed

EXAM:
PORTABLE CHEST - 1 VIEW

[chest ap]
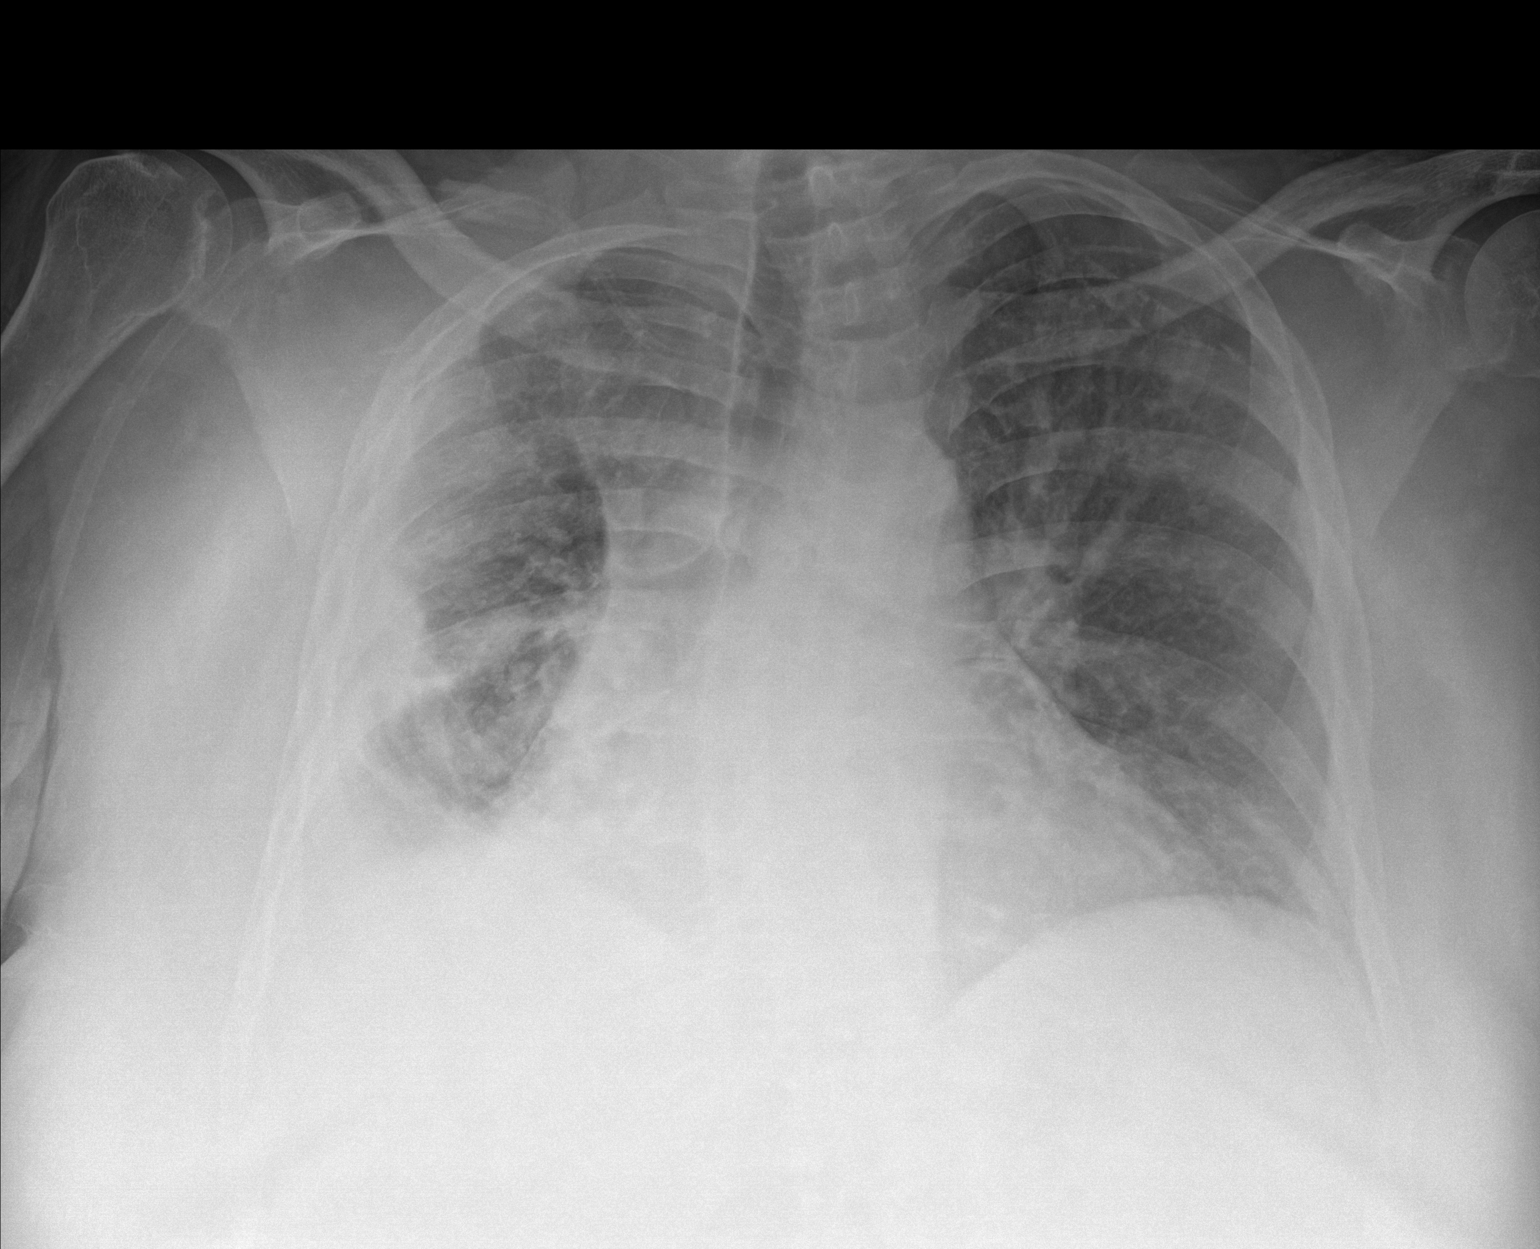

[1 of 1 positions shown; findings below may reference images not displayed]

FINDINGS: No pneumothorax. Interval decrease in size of right pleural effusion
with small probably loculated lateral residual. Improved aeration at
the right lung base with residual patchy interstitial opacities
throughout. Pulmonary vascular congestion has slightly increased
since previous.

Heart size and mediastinal contours are within normal limits.

Visualized bones unremarkable.
IMPRESSION: No pneumothorax post right thoracentesis.

## 2021-01-24 IMAGING — US US THORACENTESIS ASP PLEURAL SPACE W/IMG GUIDE
1 series · 5 of 5 positions shown · non-contrast
Comparison: none

INDICATION: Pneumonia.  Renal insufficiency on hemodialysis.

[Series 1: us thoracentesis asp pleural space w/img guide · 0.31mm/px · 5 of 5 slices shown]
[im 1/5]
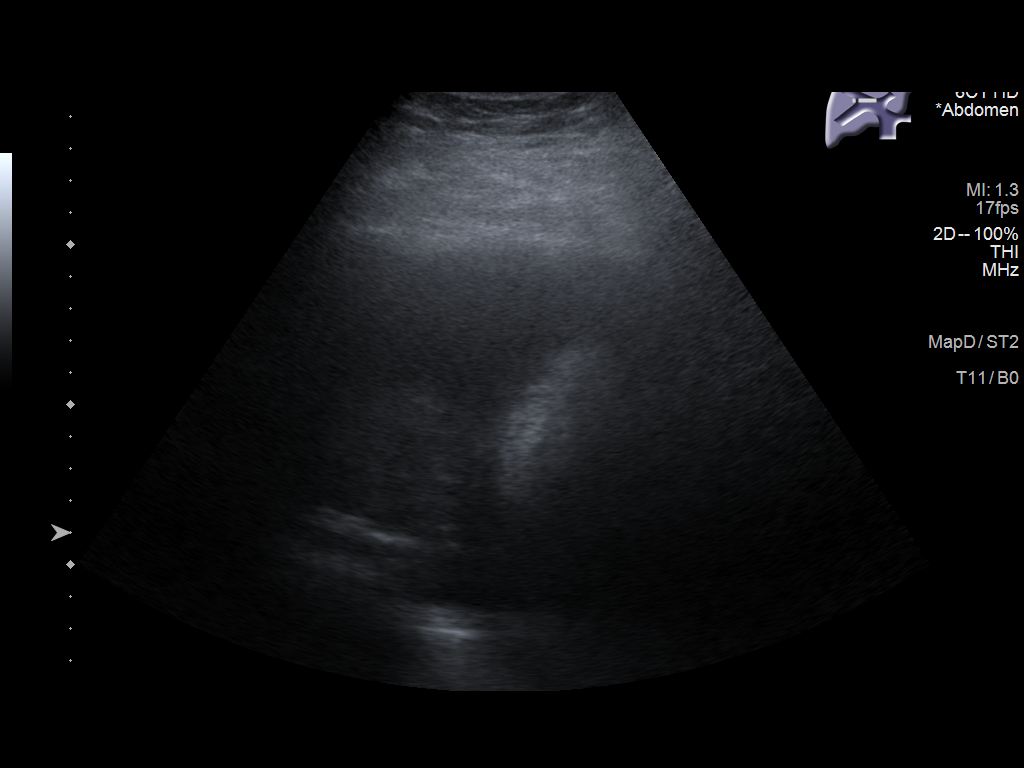
[im 2/5]
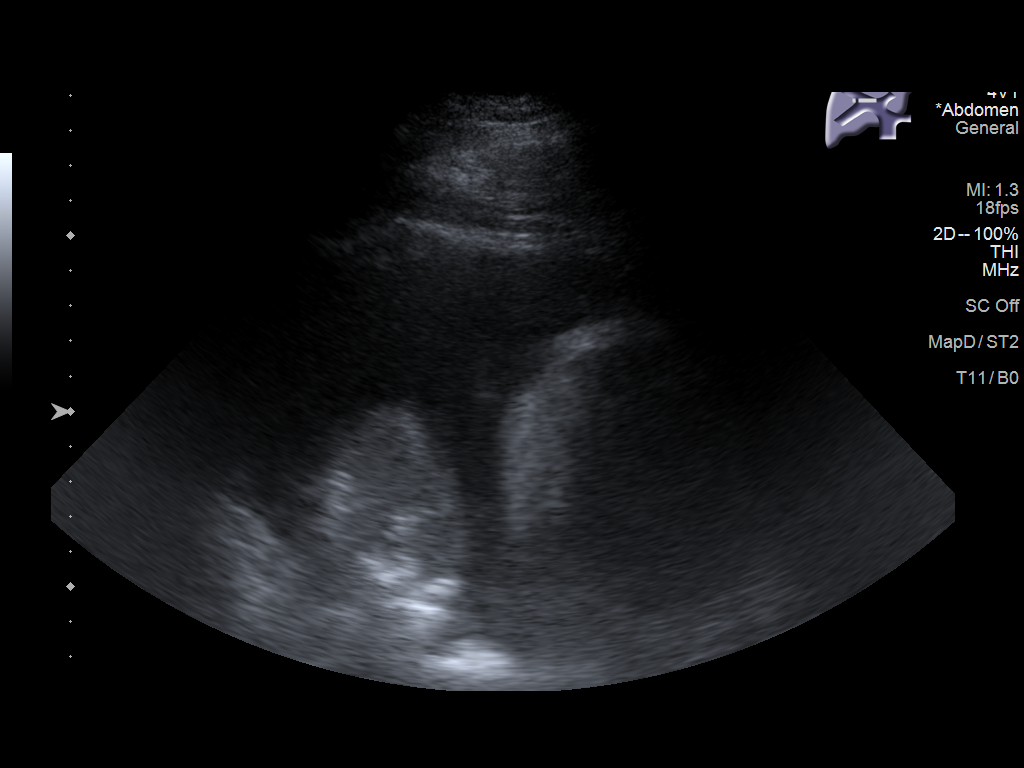
[im 3/5]
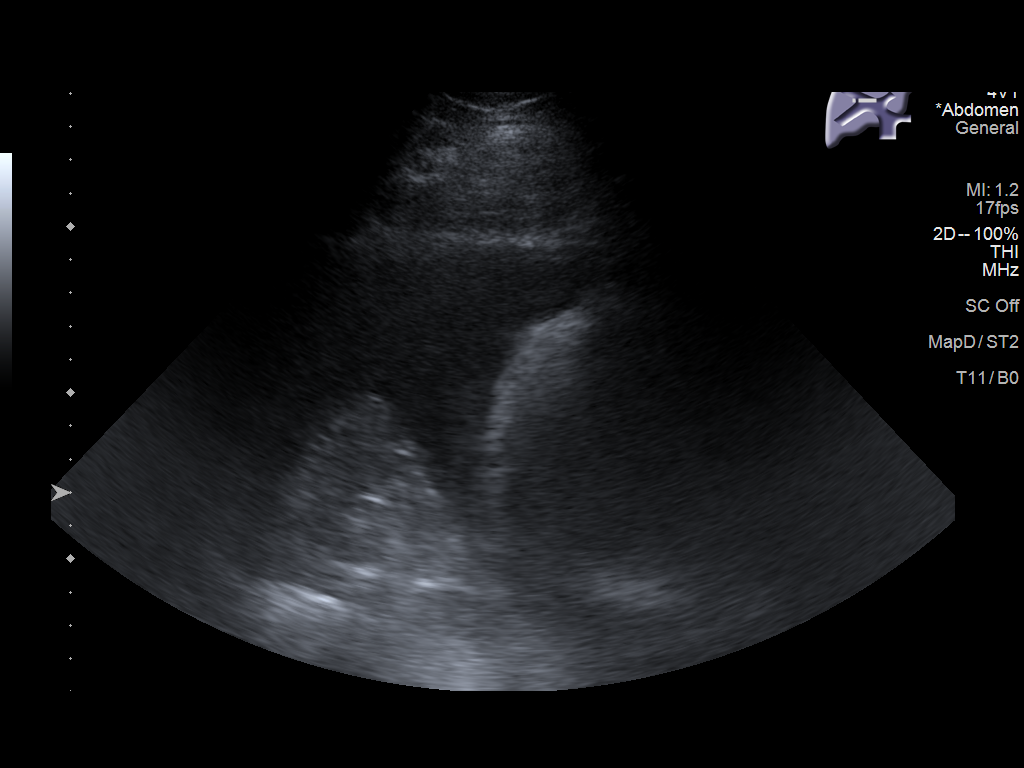
[im 4/5]
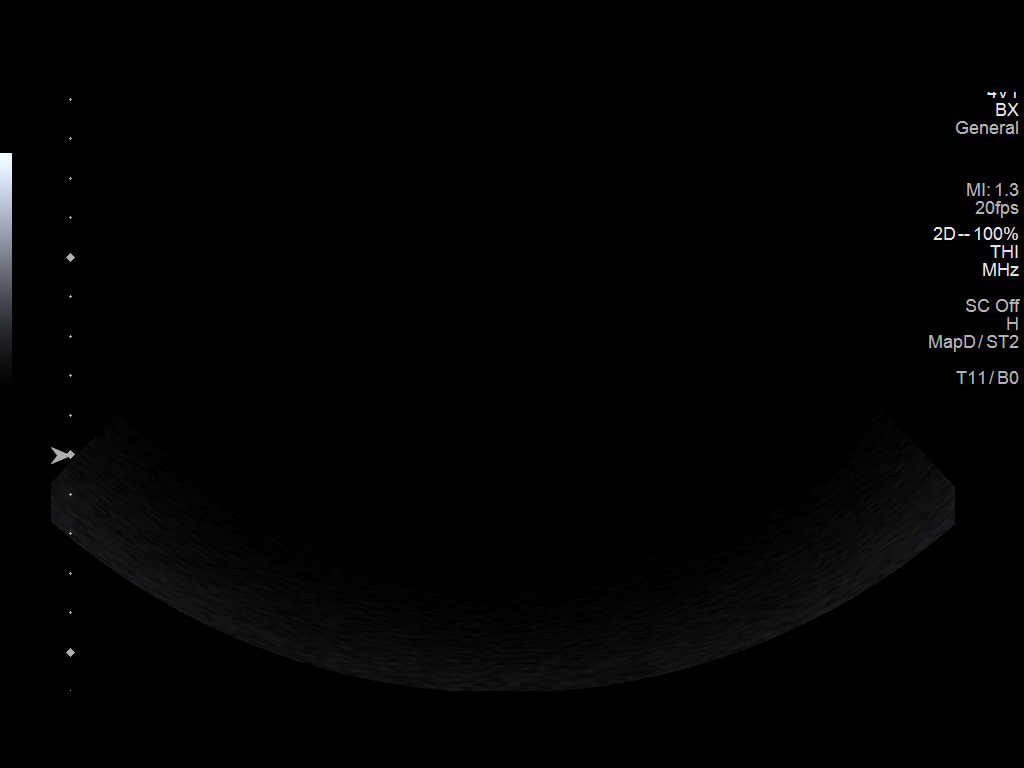
[im 5/5]
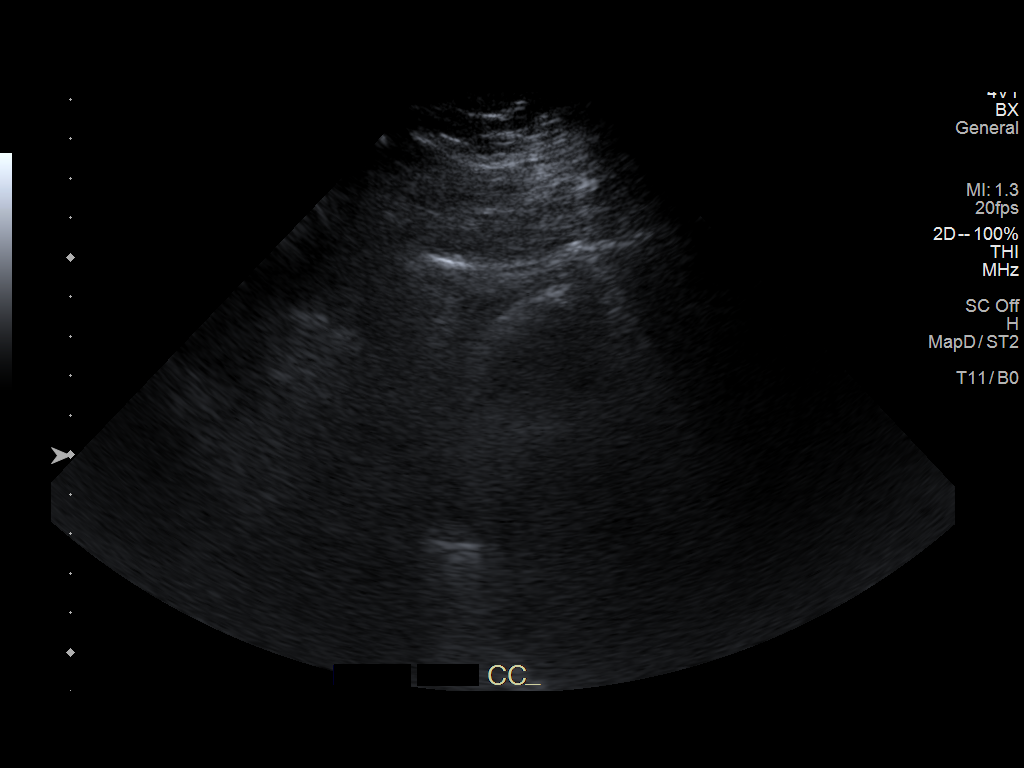

[5 of 5 positions shown; findings below may reference images not displayed]

EXAM:
ULTRASOUND GUIDED RIGHT THORACENTESIS

MEDICATIONS:
Lidocaine 1% subcutaneous

COMPLICATIONS:
None immediate.  No pneumothorax on follow-up chest radiograph.

PROCEDURE:
An ultrasound guided thoracentesis was thoroughly discussed with the
patient and questions answered. The benefits, risks, alternatives
and complications were also discussed. The patient understands and
wishes to proceed with the procedure. Written consent was obtained.

Ultrasound was performed to localize and mark an adequate pocket of
fluid in the right chest. The area was then prepped and draped in
the normal sterile fashion. 1% Lidocaine was used for local
anesthesia. Under ultrasound guidance a 6 Fr Safe-T-Centesis
catheter was introduced. Thoracentesis was performed. The catheter
was removed and a dressing applied.
FINDINGS: A total of approximately 1.2 L of clear yellow fluid was removed.
Samples were sent to the laboratory as requested by the clinical
team.
IMPRESSION: Successful ultrasound guided right thoracentesis yielding 1.2 of
pleural fluid.

## 2021-01-24 MED ORDER — BUDESONIDE 0.25 MG/2ML IN SUSP: 0.2500 mg | Freq: Two times a day (BID) | RESPIRATORY_TRACT | 6 refills | Status: DC

## 2021-01-24 NOTE — Procedures (Signed)
  Procedure: [place] Right Thoracentesis 1.2L clear yellow EBL:   minimal Complications:  none immediate  See full dictation in [facility].  Dillard Cannon MD Main # [phone] Pager  [phone] Mobile [phone]

## 2021-01-24 NOTE — Patient Instructions (Signed)
We have you will be scheduled for a thoracentesis (removal of fluid from around the lung) by the radiology department at [facility].  I am adding a medication called budesonide (Pulmicort) to your nebulizer solution that you can use after the DuoNeb twice a day.  We will see you in follow-up in 3 to 4 weeks time you may see me or the nurse practitioner at that time.

## 2021-01-24 NOTE — Progress Notes (Signed)
Subjective:    Patient ID: [name], female    DOB: 10-02-1970, 50 y.o.   MRN: [id number] Chief Complaint  Patient presents with   Follow-up    Fluid on lungs, coughing with production, wheezing. Sob.    HPI [name] is a 50 year old morbidly obese lifelong never smoker who presents for evaluation of a right pleural effusion with increasing shortness of breath.  She has end-stage renal disease and is on dialysis T/TH/S.  I have not seen this patient since August 2021 as she had been lost to follow-up.  She previously was on Trelegy Ellipta 200/62.5/25 for severe persistent asthma.  She had a refill provided for her prior to this visit however she states that she was recently in the hospital and was switched to [place] and Yupelri as maintenance with DuoNeb to use as needed only.  She uses oxygen at 3 L/min and 4 L/min during hemodialysis.  She has chronic respiratory failure due to extreme obesity with alveolar hypoventilation.  She has developed gangrene of the right foot however, refuses amputation.  The foot is nonsalvageable at present.  She has been going through wound therapy and podiatry and the recommendation remains for amputation below the knee, however she is declining this.  He was admitted to [facility] in September for volume overload and a right pleural effusion.  She had a 2D echo which showed grade II diastolic dysfunction and EF of 60 to 65%.  Her dry weight has been adjusted however the effusion persists and may be partly loculated as noted below.  She has not had any fevers, chills or sweats.  She notices difficult to expectorate sputum.  She is as noted, no longer on Trelegy which seems to help her previously she is now on [place].  She has duplication of LAMA.   Review of Systems A 10 point review of systems was performed and it is as noted above otherwise negative.  Patient Active Problem List   Diagnosis Date Noted   Acute on chronic diastolic CHF  (congestive heart failure) ([place]) 11/14/2020   Anemia of chronic disease    Hyponatremia    Gangrene (HCC)    PVD (peripheral vascular disease) ([place])    Aspiration into airway    Hearing loss of right ear    Diabetic foot ulcer ([place]) 10/15/2020   Decreased pedal pulses 10/15/2020   Chronic respiratory failure with hypoxia ([place])    Extreme obesity with alveolar hypoventilation ([place])    Palliative care by specialist    Goals of care, counseling/discussion    End-stage renal disease on hemodialysis ([place])    DNR (do not resuscitate) discussion    Adjustment disorder with mixed anxiety and depressed mood 03/31/2017   Dysthymia 03/31/2017   Chronic pain syndrome 03/31/2017   Palliative care encounter    Obesity hypoventilation syndrome ([place]) 03/09/2017   Chronic diastolic heart failure ([place]) 01/07/2017   Depression 09/24/2016   Acute on chronic diastolic heart failure ([place]) 07/04/2016   COPD with chronic bronchitis ([place]) 07/04/2016   Acute on chronic respiratory failure ([place]) 06/26/2016   CKD (chronic kidney disease), stage III ([place]) 06/26/2016   COPD exacerbation ([place]) 03/25/2016   Anxiety 10/02/2015   Asthma 08/30/2015   Type 2 diabetes mellitus with ESRD (end-stage renal disease) ([place]) 03/13/2015   Iron deficiency anemia 02/17/2015   Obesity, Class III, BMI 40-49.9 (morbid obesity) ([place])    Shortness of breath    Essential hypertension    Hypertensive heart disease  with congestive heart failure ([place]) 12/18/2014   Diabetes mellitus with ESRD (end-stage renal disease) ([place]) 12/15/2014   Social History   Tobacco Use   Smoking status: Never   Smokeless tobacco: Never  Substance Use Topics   Alcohol use: Not Currently    Comment: occasional   Allergies  Allergen Reactions   Dust Mite Extract Shortness Of Breath and Itching   Mold Extract [Trichophyton] Shortness Of Breath and Itching   Pollen Extract Shortness Of Breath    Itching, shortness of breath, asthma like symptoms    Sulfa Antibiotics Itching   Feraheme [Ferumoxytol] Itching    Uses benadryl so she can get the medicine   Current Meds  Medication Sig   acetaminophen (TYLENOL) 500 MG tablet Take 1,000 mg by mouth every 6 (six) hours as needed for mild pain, fever or headache.   albuterol (PROVENTIL) (2.5 MG/3ML) 0.083% nebulizer solution Take 3 mLs (2.5 mg total) by nebulization every 6 (six) hours as needed for wheezing or shortness of breath.   albuterol (VENTOLIN HFA) 108 (90 Base) MCG/ACT inhaler Inhale 2 puffs into the lungs every 4 (four) hours as needed for wheezing or shortness of breath.   aspirin EC 81 MG tablet Take 81 mg by mouth daily.    b complex-vitamin c-folic acid (NEPHRO-VITE) 0.8 MG TABS tablet Take 1 tablet by mouth at bedtime.   budesonide (PULMICORT) 0.25 MG/2ML nebulizer solution Take 2 mLs (0.25 mg total) by nebulization 2 (two) times daily.   Cholecalciferol 100 MCG (4000 UT) CAPS Take 1 capsule by mouth once a week.   clindamycin (CLEOCIN T) 1 % lotion Apply 1 application topically daily.   docusate sodium (COLACE) 100 MG capsule Take 100 mg by mouth daily.   ferric citrate (AURYXIA) 1 GM 210 MG(Fe) tablet Take 210 mg by mouth 3 (three) times daily with meals.   formoterol (PERFOROMIST) 20 MCG/2ML nebulizer solution    insulin aspart (NOVOLOG) 100 UNIT/ML injection Inject 0-5 Units into the skin at bedtime.   insulin aspart (NOVOLOG) 100 UNIT/ML injection Inject 0-15 Units into the skin 3 (three) times daily with meals.   insulin degludec (TRESIBA) 100 UNIT/ML FlexTouch Pen Inject 43 Units into the skin daily at 10 pm.   ipratropium-albuterol (DUONEB) 0.5-2.5 (3) MG/3ML SOLN Take 3 mLs by nebulization every 6 (six) hours as needed.   lactulose (CHRONULAC) 10 GM/15ML solution Take 30 mLs (20 g total) by mouth daily as needed for severe constipation.   levocetirizine (XYZAL) 5 MG tablet Take 5 mg by mouth at bedtime.   lidocaine-prilocaine (EMLA) cream Apply topically as needed  (For access for dialysis).   LINZESS 290 MCG CAPS capsule Take 290 mcg by mouth every morning.   metoprolol tartrate (LOPRESSOR) 25 MG tablet Take 1 tablet (25 mg total) by mouth 2 (two) times daily.   midodrine (PROAMATINE) 10 MG tablet Take 20 mg by mouth daily as needed.   Omega-3 Fatty Acids ([employer] NATURAL FISH OIL) 1000 MG CAPS 2 mL 2 TIMES DAILY (route: inhalation)   omeprazole (PRILOSEC) 20 MG capsule Take 20 mg by mouth daily.   oxyCODONE-acetaminophen (PERCOCET) 5-325 MG tablet Take 1 tablet by mouth every 4 (four) hours as needed for severe pain.   OXYGEN Inhale 2-4 L into the lungs.   revefenacin (YUPELRI) 175 MCG/3ML nebulizer solution    [DISCONTINUED] budesonide (PULMICORT) 0.5 MG/2ML nebulizer solution Take by nebulization.   [DISCONTINUED] Fluticasone-Umeclidin-Vilant (TRELEGY ELLIPTA) 200-62.5-25 MCG/INH AEPB INHALE 1 PUFF BY MOUTH ONCE DAILY. APPOINTMENT  NEEDED FOR FURTHER REFILLS.   Immunization History  Administered Date(s) Administered   Hepatitis B, adult 09/26/2018   Influenza,inj,Quad PF,6+ Mos 11/14/2016   Influenza-Unspecified 11/14/2016, 12/27/2017, 10/16/2018, 11/05/2018   PPD Test 04/15/2017, 05/06/2017, 05/15/2017, 04/28/2018, 04/13/2019, 06/01/2020   Pneumococcal Polysaccharide-23 04/29/2017       Objective:   Physical Exam BP 110/78 (BP Location: Left Arm, Patient Position: Sitting, Cuff Size: Large)   Pulse (!) 118   Temp 97.8 F (36.6 C) (Oral)   Ht 5' 4.5" (1.638 m)   Wt 268 lb (121.6 kg)   LMP 09/06/2016 (Approximate)   SpO2 97%   BMI 45.29 kg/m  GENERAL: Obese woman in no acute respiratory distress.  She presents in a transport chair.  She is wearing oxygen via nasal cannula.  HEAD: Normocephalic, atraumatic.  EYES: Pupils equal, round, reactive to light.  No scleral icterus.  MOUTH: Nose/mouth/throat not examined due to masking requirements for COVID 19. NECK: Supple. No thyromegaly. Trachea midline. No JVD.  No adenopathy. PULMONARY:  Symmetrical air entry.  She has scattered rhonchi and wheezes noted.  Moving air fairly well however. CARDIOVASCULAR: S1 and S2. Regular rate and rhythm.  No rubs or gallops heard.  Grade 2/6 systolic ejection murmur radiating to axilla. GASTROINTESTINAL: Obese abdomen, nondistended. MUSCULOSKELETAL: AV fistula left upper extremity with good thrill.  She has a gangrenous right foot, malodorous, it is wrapped.  This was not exposed for examination. NEUROLOGIC: Awake, alert, no overt focal deficits.  Speech is fluent.  Gait not assessed as patient is on transport chair. SKIN: Intact,warm,dry.  No overt rashes noted. PSYCH: Mood and behavior appropriate   Most recent chest x-ray of 15 January 2021, independently reviewed, shows partly loculated right pleural effusion:     Assessment & Plan:     ICD-10-CM   1. Pleural effusion on right  J90 US THORACENTESIS ASP PLEURAL SPACE W/IMG GUIDE    CANCELED: IR THORACENTESIS ASP PLEURAL SPACE W/IMG GUIDE   Thoracentesis per IR IR able to get her in today Pleural fluid for analysis    2. Severe persistent asthma without complication  F75.10    On Perforomist/Yupelri prescribed after last admission Add Pulmicort    3. Chronic respiratory failure with hypoxia and hypercapnia (HCC)  J96.11    J96.12    Mostly due to to obesity with alveolar hypoventilation Continue oxygen supplementation    4. Extreme obesity with alveolar hypoventilation (HCC)  E66.2    Weight loss recommended    5. Shortness of breath  R06.02    Aggravated by right pleural effusion Thoracentesis per IR today    6. ESRD (end stage renal disease) on dialysis [facility])  N18.6    Z99.2    This issue adds complexity to her management Followed by nephrology Dialysis Tuesday Thursday Saturday    7. Gangrene of right foot [facility])  I96    Patient should have BKA Patient declines BKA High risk for developing sepsis     Orders Placed This Encounter  Procedures   US THORACENTESIS  ASP PLEURAL SPACE W/IMG GUIDE    Pt does dialysis on Monday,Wednesday and Friday    Standing Status:   Future    Number of Occurrences:   1    Standing Expiration Date:   01/24/2022    Order Specific Question:   Are labs required for specimen collection?    Answer:   Yes    Order Specific Question:   Lab orders requested (DO NOT place separate lab orders,  these will be automatically ordered during procedure specimen collection):    Answer:   Acid Fast Culture With Reflexed Sensitivities    Order Specific Question:   Lab orders requested (DO NOT place separate lab orders, these will be automatically ordered during procedure specimen collection):    Answer:   Albumin, Body Fluid    Order Specific Question:   Lab orders requested (DO NOT place separate lab orders, these will be automatically ordered during procedure specimen collection):    Answer:   Amylase, Body Fluid    Order Specific Question:   Lab orders requested (DO NOT place separate lab orders, these will be automatically ordered during procedure specimen collection):    Answer:   Body Fluid Cell Count With Differential    Order Specific Question:   Lab orders requested (DO NOT place separate lab orders, these will be automatically ordered during procedure specimen collection):    Answer:   Body Fluid Culture    Order Specific Question:   Lab orders requested (DO NOT place separate lab orders, these will be automatically ordered during procedure specimen collection):    Answer:   Cytology - Non Pap    Order Specific Question:   Lab orders requested (DO NOT place separate lab orders, these will be automatically ordered during procedure specimen collection):    Answer:   Fungus Culture With Stain    Order Specific Question:   Lab orders requested (DO NOT place separate lab orders, these will be automatically ordered during procedure specimen collection):    Answer:   Glucose, Pleural Or Peritoneal Fluid    Order Specific Question:   Lab  orders requested (DO NOT place separate lab orders, these will be automatically ordered during procedure specimen collection):    Answer:   Gram Stain    Order Specific Question:   Lab orders requested (DO NOT place separate lab orders, these will be automatically ordered during procedure specimen collection):    Answer:   Lactate Dehydrogenase, Body Fluid    Order Specific Question:   Lab orders requested (DO NOT place separate lab orders, these will be automatically ordered during procedure specimen collection):    Answer:   Ph, Body Fluid    Order Specific Question:   Lab orders requested (DO NOT place separate lab orders, these will be automatically ordered during procedure specimen collection):    Answer:   Protein, Pleural Or Peritoneal Fluid    Order Specific Question:   Reason for Exam (SYMPTOM  OR DIAGNOSIS REQUIRED)    Answer:   Pleural effusion    Comments:   right    Order Specific Question:   Preferred imaging location?    Answer:   [facility] ordered this encounter  Medications   budesonide (PULMICORT) 0.25 MG/2ML nebulizer solution    Sig: Take 2 mLs (0.25 mg total) by nebulization 2 (two) times daily.    Dispense:  60 mL    Refill:  6   The patient will need thoracentesis to better ascertain the etiology of the effusion however suspect that this is still related to volume overload and end-stage renal disease.  Aggravated by diastolic dysfunction.  IR has the opportunity to do her thoracentesis today so we will get her tapped today.  We have ordered laboratory studies to determine etiology of the fluid.  Her main pulmonary issue is related to alveolar hypoventilation from extreme obesity.  She does have a history of asthma since childhood and previously had  difficulty controlling this.  As such she needs an anti-inflammatory added to her regimen.  We have added budesonide to her nebulizer regimen.  She should not have duplication of LAMA, recommend switching DuoNeb  to as needed albuterol only continue Perforomist and Yupelri.  We will see her in follow-up in 3 to 4 weeks time with either me or the nurse practitioner at that time she is to contact us prior to that time should any new difficulties arise.   Renold Don, MD Advanced Bronchoscopy PCCM [facility]    *This note was dictated using voice recognition software/Dragon.  Despite best efforts to proofread, errors can occur which can change the meaning.  Any change was purely unintentional.

## 2021-01-25 ENCOUNTER — Telehealth: Payer: Self-pay

## 2021-01-25 DIAGNOSIS — J45909 Unspecified asthma, uncomplicated: Secondary | ICD-10-CM | POA: Diagnosis not present

## 2021-01-25 DIAGNOSIS — Z992 Dependence on renal dialysis: Secondary | ICD-10-CM | POA: Diagnosis not present

## 2021-01-25 DIAGNOSIS — N186 End stage renal disease: Secondary | ICD-10-CM | POA: Diagnosis not present

## 2021-01-25 DIAGNOSIS — I5040 Unspecified combined systolic (congestive) and diastolic (congestive) heart failure: Secondary | ICD-10-CM | POA: Diagnosis not present

## 2021-01-25 LAB — CYTOLOGY - NON PAP

## 2021-01-25 LAB — ACID FAST SMEAR (AFB, MYCOBACTERIA): Acid Fast Smear: NEGATIVE

## 2021-01-25 MED ORDER — ALBUTEROL SULFATE HFA 108 (90 BASE) MCG/ACT IN AERS: 2.0000 | INHALATION_SPRAY | RESPIRATORY_TRACT | 2 refills | Status: DC | PRN

## 2021-01-25 NOTE — Telephone Encounter (Signed)
Patient requested refill for albuterol. Order placed nothing further needed.

## 2021-01-26 DIAGNOSIS — I132 Hypertensive heart and chronic kidney disease with heart failure and with stage 5 chronic kidney disease, or end stage renal disease: Secondary | ICD-10-CM | POA: Diagnosis not present

## 2021-01-26 DIAGNOSIS — E1151 Type 2 diabetes mellitus with diabetic peripheral angiopathy without gangrene: Secondary | ICD-10-CM | POA: Diagnosis not present

## 2021-01-26 DIAGNOSIS — E1122 Type 2 diabetes mellitus with diabetic chronic kidney disease: Secondary | ICD-10-CM | POA: Diagnosis not present

## 2021-01-26 DIAGNOSIS — N186 End stage renal disease: Secondary | ICD-10-CM | POA: Diagnosis not present

## 2021-01-26 DIAGNOSIS — Z9981 Dependence on supplemental oxygen: Secondary | ICD-10-CM | POA: Diagnosis not present

## 2021-01-26 DIAGNOSIS — I5032 Chronic diastolic (congestive) heart failure: Secondary | ICD-10-CM | POA: Diagnosis not present

## 2021-01-26 DIAGNOSIS — E1152 Type 2 diabetes mellitus with diabetic peripheral angiopathy with gangrene: Secondary | ICD-10-CM | POA: Diagnosis not present

## 2021-01-26 DIAGNOSIS — Z794 Long term (current) use of insulin: Secondary | ICD-10-CM | POA: Diagnosis not present

## 2021-01-26 DIAGNOSIS — Z992 Dependence on renal dialysis: Secondary | ICD-10-CM | POA: Diagnosis not present

## 2021-01-26 DIAGNOSIS — L97511 Non-pressure chronic ulcer of other part of right foot limited to breakdown of skin: Secondary | ICD-10-CM | POA: Diagnosis not present

## 2021-01-26 DIAGNOSIS — E11621 Type 2 diabetes mellitus with foot ulcer: Secondary | ICD-10-CM | POA: Diagnosis not present

## 2021-01-26 DIAGNOSIS — D631 Anemia in chronic kidney disease: Secondary | ICD-10-CM | POA: Diagnosis not present

## 2021-01-27 DIAGNOSIS — N186 End stage renal disease: Secondary | ICD-10-CM | POA: Diagnosis not present

## 2021-01-27 DIAGNOSIS — Z992 Dependence on renal dialysis: Secondary | ICD-10-CM | POA: Diagnosis not present

## 2021-01-28 LAB — BODY FLUID CULTURE W GRAM STAIN
Culture: NO GROWTH
Gram Stain: NONE SEEN

## 2021-01-29 DIAGNOSIS — J449 Chronic obstructive pulmonary disease, unspecified: Secondary | ICD-10-CM | POA: Diagnosis not present

## 2021-01-30 DIAGNOSIS — N186 End stage renal disease: Secondary | ICD-10-CM | POA: Diagnosis not present

## 2021-01-30 DIAGNOSIS — Z992 Dependence on renal dialysis: Secondary | ICD-10-CM | POA: Diagnosis not present

## 2021-01-31 DIAGNOSIS — E1122 Type 2 diabetes mellitus with diabetic chronic kidney disease: Secondary | ICD-10-CM | POA: Diagnosis not present

## 2021-01-31 DIAGNOSIS — I5032 Chronic diastolic (congestive) heart failure: Secondary | ICD-10-CM | POA: Diagnosis not present

## 2021-01-31 DIAGNOSIS — Z9981 Dependence on supplemental oxygen: Secondary | ICD-10-CM | POA: Diagnosis not present

## 2021-01-31 DIAGNOSIS — D631 Anemia in chronic kidney disease: Secondary | ICD-10-CM | POA: Diagnosis not present

## 2021-01-31 DIAGNOSIS — L97511 Non-pressure chronic ulcer of other part of right foot limited to breakdown of skin: Secondary | ICD-10-CM | POA: Diagnosis not present

## 2021-01-31 DIAGNOSIS — Z794 Long term (current) use of insulin: Secondary | ICD-10-CM | POA: Diagnosis not present

## 2021-01-31 DIAGNOSIS — E1151 Type 2 diabetes mellitus with diabetic peripheral angiopathy without gangrene: Secondary | ICD-10-CM | POA: Diagnosis not present

## 2021-01-31 DIAGNOSIS — I132 Hypertensive heart and chronic kidney disease with heart failure and with stage 5 chronic kidney disease, or end stage renal disease: Secondary | ICD-10-CM | POA: Diagnosis not present

## 2021-01-31 DIAGNOSIS — E1152 Type 2 diabetes mellitus with diabetic peripheral angiopathy with gangrene: Secondary | ICD-10-CM | POA: Diagnosis not present

## 2021-01-31 DIAGNOSIS — N186 End stage renal disease: Secondary | ICD-10-CM | POA: Diagnosis not present

## 2021-01-31 DIAGNOSIS — Z992 Dependence on renal dialysis: Secondary | ICD-10-CM | POA: Diagnosis not present

## 2021-01-31 DIAGNOSIS — E11621 Type 2 diabetes mellitus with foot ulcer: Secondary | ICD-10-CM | POA: Diagnosis not present

## 2021-02-01 ENCOUNTER — Telehealth: Payer: Self-pay | Admitting: Pulmonary Disease

## 2021-02-01 DIAGNOSIS — Z992 Dependence on renal dialysis: Secondary | ICD-10-CM | POA: Diagnosis not present

## 2021-02-01 DIAGNOSIS — N186 End stage renal disease: Secondary | ICD-10-CM | POA: Diagnosis not present

## 2021-02-01 NOTE — Telephone Encounter (Signed)
The fluid was consistent with fluid accumulating from kidney failure and the heart being stiff.  It is not infected.  It had however been there for a while and thus the reason why it got trapped in the area that it did.  Hopefully the thoracentesis relieved her symptoms.  She is supposed to see [place] back of first week of January.  Other issues that cause fluid like this are inflammation in the body.  I have seen her blood work done while she was in the hospital and recently and it shows that she has significant inflammation in her body.  This is likely related to the issues she is having with her foot.  We can get another chest x-ray to see how the fluid is behaving after the thoracentesis.  But the lung is reacting to all the other issues she has going on in her body.

## 2021-02-01 NOTE — Telephone Encounter (Signed)
Called and spoke to patient.  She is wanting to know next steps after thoracentesis? Thoracentesis was performed 01/24/21   Dr. Patsey Berthold, please advise. Thanks

## 2021-02-01 NOTE — Telephone Encounter (Signed)
Patient is aware of results/recommendations and voiced her understanding.  She would like to hold off on CXR for now. She will call back if she can not get CXR at PCP.  Nothing further needed at this time.

## 2021-02-02 DIAGNOSIS — L97511 Non-pressure chronic ulcer of other part of right foot limited to breakdown of skin: Secondary | ICD-10-CM | POA: Diagnosis not present

## 2021-02-02 DIAGNOSIS — Z9981 Dependence on supplemental oxygen: Secondary | ICD-10-CM | POA: Diagnosis not present

## 2021-02-02 DIAGNOSIS — E1122 Type 2 diabetes mellitus with diabetic chronic kidney disease: Secondary | ICD-10-CM | POA: Diagnosis not present

## 2021-02-02 DIAGNOSIS — E11621 Type 2 diabetes mellitus with foot ulcer: Secondary | ICD-10-CM | POA: Diagnosis not present

## 2021-02-02 DIAGNOSIS — I132 Hypertensive heart and chronic kidney disease with heart failure and with stage 5 chronic kidney disease, or end stage renal disease: Secondary | ICD-10-CM | POA: Diagnosis not present

## 2021-02-02 DIAGNOSIS — E1151 Type 2 diabetes mellitus with diabetic peripheral angiopathy without gangrene: Secondary | ICD-10-CM | POA: Diagnosis not present

## 2021-02-02 DIAGNOSIS — E1152 Type 2 diabetes mellitus with diabetic peripheral angiopathy with gangrene: Secondary | ICD-10-CM | POA: Diagnosis not present

## 2021-02-02 DIAGNOSIS — D631 Anemia in chronic kidney disease: Secondary | ICD-10-CM | POA: Diagnosis not present

## 2021-02-02 DIAGNOSIS — Z992 Dependence on renal dialysis: Secondary | ICD-10-CM | POA: Diagnosis not present

## 2021-02-02 DIAGNOSIS — I5032 Chronic diastolic (congestive) heart failure: Secondary | ICD-10-CM | POA: Diagnosis not present

## 2021-02-02 DIAGNOSIS — N186 End stage renal disease: Secondary | ICD-10-CM | POA: Diagnosis not present

## 2021-02-02 DIAGNOSIS — Z794 Long term (current) use of insulin: Secondary | ICD-10-CM | POA: Diagnosis not present

## 2021-02-03 DIAGNOSIS — Z992 Dependence on renal dialysis: Secondary | ICD-10-CM | POA: Diagnosis not present

## 2021-02-03 DIAGNOSIS — N186 End stage renal disease: Secondary | ICD-10-CM | POA: Diagnosis not present

## 2021-02-05 DIAGNOSIS — Z9981 Dependence on supplemental oxygen: Secondary | ICD-10-CM | POA: Diagnosis not present

## 2021-02-05 DIAGNOSIS — E11621 Type 2 diabetes mellitus with foot ulcer: Secondary | ICD-10-CM | POA: Diagnosis not present

## 2021-02-05 DIAGNOSIS — Z794 Long term (current) use of insulin: Secondary | ICD-10-CM | POA: Diagnosis not present

## 2021-02-05 DIAGNOSIS — E1152 Type 2 diabetes mellitus with diabetic peripheral angiopathy with gangrene: Secondary | ICD-10-CM | POA: Diagnosis not present

## 2021-02-05 DIAGNOSIS — L97511 Non-pressure chronic ulcer of other part of right foot limited to breakdown of skin: Secondary | ICD-10-CM | POA: Diagnosis not present

## 2021-02-05 DIAGNOSIS — I5032 Chronic diastolic (congestive) heart failure: Secondary | ICD-10-CM | POA: Diagnosis not present

## 2021-02-05 DIAGNOSIS — I132 Hypertensive heart and chronic kidney disease with heart failure and with stage 5 chronic kidney disease, or end stage renal disease: Secondary | ICD-10-CM | POA: Diagnosis not present

## 2021-02-05 DIAGNOSIS — D631 Anemia in chronic kidney disease: Secondary | ICD-10-CM | POA: Diagnosis not present

## 2021-02-05 DIAGNOSIS — E1151 Type 2 diabetes mellitus with diabetic peripheral angiopathy without gangrene: Secondary | ICD-10-CM | POA: Diagnosis not present

## 2021-02-05 DIAGNOSIS — E1122 Type 2 diabetes mellitus with diabetic chronic kidney disease: Secondary | ICD-10-CM | POA: Diagnosis not present

## 2021-02-05 DIAGNOSIS — Z992 Dependence on renal dialysis: Secondary | ICD-10-CM | POA: Diagnosis not present

## 2021-02-05 DIAGNOSIS — N186 End stage renal disease: Secondary | ICD-10-CM | POA: Diagnosis not present

## 2021-02-06 DIAGNOSIS — J449 Chronic obstructive pulmonary disease, unspecified: Secondary | ICD-10-CM | POA: Diagnosis not present

## 2021-02-06 DIAGNOSIS — I96 Gangrene, not elsewhere classified: Secondary | ICD-10-CM | POA: Diagnosis not present

## 2021-02-06 DIAGNOSIS — N2581 Secondary hyperparathyroidism of renal origin: Secondary | ICD-10-CM | POA: Diagnosis not present

## 2021-02-06 DIAGNOSIS — Z992 Dependence on renal dialysis: Secondary | ICD-10-CM | POA: Diagnosis not present

## 2021-02-06 DIAGNOSIS — I5030 Unspecified diastolic (congestive) heart failure: Secondary | ICD-10-CM | POA: Diagnosis not present

## 2021-02-06 DIAGNOSIS — J9610 Chronic respiratory failure, unspecified whether with hypoxia or hypercapnia: Secondary | ICD-10-CM | POA: Diagnosis not present

## 2021-02-06 DIAGNOSIS — N186 End stage renal disease: Secondary | ICD-10-CM | POA: Diagnosis not present

## 2021-02-06 DIAGNOSIS — L97509 Non-pressure chronic ulcer of other part of unspecified foot with unspecified severity: Secondary | ICD-10-CM | POA: Diagnosis not present

## 2021-02-07 DIAGNOSIS — N186 End stage renal disease: Secondary | ICD-10-CM | POA: Diagnosis not present

## 2021-02-07 DIAGNOSIS — D631 Anemia in chronic kidney disease: Secondary | ICD-10-CM | POA: Diagnosis not present

## 2021-02-07 DIAGNOSIS — Z9981 Dependence on supplemental oxygen: Secondary | ICD-10-CM | POA: Diagnosis not present

## 2021-02-07 DIAGNOSIS — L97511 Non-pressure chronic ulcer of other part of right foot limited to breakdown of skin: Secondary | ICD-10-CM | POA: Diagnosis not present

## 2021-02-07 DIAGNOSIS — E1151 Type 2 diabetes mellitus with diabetic peripheral angiopathy without gangrene: Secondary | ICD-10-CM | POA: Diagnosis not present

## 2021-02-07 DIAGNOSIS — I5032 Chronic diastolic (congestive) heart failure: Secondary | ICD-10-CM | POA: Diagnosis not present

## 2021-02-07 DIAGNOSIS — E1122 Type 2 diabetes mellitus with diabetic chronic kidney disease: Secondary | ICD-10-CM | POA: Diagnosis not present

## 2021-02-07 DIAGNOSIS — Z992 Dependence on renal dialysis: Secondary | ICD-10-CM | POA: Diagnosis not present

## 2021-02-07 DIAGNOSIS — Z794 Long term (current) use of insulin: Secondary | ICD-10-CM | POA: Diagnosis not present

## 2021-02-07 DIAGNOSIS — E1152 Type 2 diabetes mellitus with diabetic peripheral angiopathy with gangrene: Secondary | ICD-10-CM | POA: Diagnosis not present

## 2021-02-07 DIAGNOSIS — E11621 Type 2 diabetes mellitus with foot ulcer: Secondary | ICD-10-CM | POA: Diagnosis not present

## 2021-02-07 DIAGNOSIS — I132 Hypertensive heart and chronic kidney disease with heart failure and with stage 5 chronic kidney disease, or end stage renal disease: Secondary | ICD-10-CM | POA: Diagnosis not present

## 2021-02-08 DIAGNOSIS — Z992 Dependence on renal dialysis: Secondary | ICD-10-CM | POA: Diagnosis not present

## 2021-02-08 DIAGNOSIS — N186 End stage renal disease: Secondary | ICD-10-CM | POA: Diagnosis not present

## 2021-02-09 DIAGNOSIS — Z9981 Dependence on supplemental oxygen: Secondary | ICD-10-CM | POA: Diagnosis not present

## 2021-02-09 DIAGNOSIS — L97511 Non-pressure chronic ulcer of other part of right foot limited to breakdown of skin: Secondary | ICD-10-CM | POA: Diagnosis not present

## 2021-02-09 DIAGNOSIS — E11621 Type 2 diabetes mellitus with foot ulcer: Secondary | ICD-10-CM | POA: Diagnosis not present

## 2021-02-09 DIAGNOSIS — E1122 Type 2 diabetes mellitus with diabetic chronic kidney disease: Secondary | ICD-10-CM | POA: Diagnosis not present

## 2021-02-09 DIAGNOSIS — E1151 Type 2 diabetes mellitus with diabetic peripheral angiopathy without gangrene: Secondary | ICD-10-CM | POA: Diagnosis not present

## 2021-02-09 DIAGNOSIS — Z794 Long term (current) use of insulin: Secondary | ICD-10-CM | POA: Diagnosis not present

## 2021-02-09 DIAGNOSIS — D631 Anemia in chronic kidney disease: Secondary | ICD-10-CM | POA: Diagnosis not present

## 2021-02-09 DIAGNOSIS — Z992 Dependence on renal dialysis: Secondary | ICD-10-CM | POA: Diagnosis not present

## 2021-02-09 DIAGNOSIS — I5032 Chronic diastolic (congestive) heart failure: Secondary | ICD-10-CM | POA: Diagnosis not present

## 2021-02-09 DIAGNOSIS — N186 End stage renal disease: Secondary | ICD-10-CM | POA: Diagnosis not present

## 2021-02-09 DIAGNOSIS — E1152 Type 2 diabetes mellitus with diabetic peripheral angiopathy with gangrene: Secondary | ICD-10-CM | POA: Diagnosis not present

## 2021-02-09 DIAGNOSIS — I132 Hypertensive heart and chronic kidney disease with heart failure and with stage 5 chronic kidney disease, or end stage renal disease: Secondary | ICD-10-CM | POA: Diagnosis not present

## 2021-02-10 DIAGNOSIS — Z992 Dependence on renal dialysis: Secondary | ICD-10-CM | POA: Diagnosis not present

## 2021-02-10 DIAGNOSIS — N186 End stage renal disease: Secondary | ICD-10-CM | POA: Diagnosis not present

## 2021-02-11 DIAGNOSIS — J9610 Chronic respiratory failure, unspecified whether with hypoxia or hypercapnia: Secondary | ICD-10-CM | POA: Diagnosis not present

## 2021-02-11 DIAGNOSIS — J449 Chronic obstructive pulmonary disease, unspecified: Secondary | ICD-10-CM | POA: Diagnosis not present

## 2021-02-12 DIAGNOSIS — E1122 Type 2 diabetes mellitus with diabetic chronic kidney disease: Secondary | ICD-10-CM | POA: Diagnosis not present

## 2021-02-12 DIAGNOSIS — I132 Hypertensive heart and chronic kidney disease with heart failure and with stage 5 chronic kidney disease, or end stage renal disease: Secondary | ICD-10-CM | POA: Diagnosis not present

## 2021-02-12 DIAGNOSIS — E11621 Type 2 diabetes mellitus with foot ulcer: Secondary | ICD-10-CM | POA: Diagnosis not present

## 2021-02-12 DIAGNOSIS — Z9981 Dependence on supplemental oxygen: Secondary | ICD-10-CM | POA: Diagnosis not present

## 2021-02-12 DIAGNOSIS — L97511 Non-pressure chronic ulcer of other part of right foot limited to breakdown of skin: Secondary | ICD-10-CM | POA: Diagnosis not present

## 2021-02-12 DIAGNOSIS — E1151 Type 2 diabetes mellitus with diabetic peripheral angiopathy without gangrene: Secondary | ICD-10-CM | POA: Diagnosis not present

## 2021-02-12 DIAGNOSIS — E1152 Type 2 diabetes mellitus with diabetic peripheral angiopathy with gangrene: Secondary | ICD-10-CM | POA: Diagnosis not present

## 2021-02-12 DIAGNOSIS — D631 Anemia in chronic kidney disease: Secondary | ICD-10-CM | POA: Diagnosis not present

## 2021-02-12 DIAGNOSIS — I5032 Chronic diastolic (congestive) heart failure: Secondary | ICD-10-CM | POA: Diagnosis not present

## 2021-02-12 DIAGNOSIS — Z992 Dependence on renal dialysis: Secondary | ICD-10-CM | POA: Diagnosis not present

## 2021-02-12 DIAGNOSIS — N186 End stage renal disease: Secondary | ICD-10-CM | POA: Diagnosis not present

## 2021-02-12 DIAGNOSIS — Z794 Long term (current) use of insulin: Secondary | ICD-10-CM | POA: Diagnosis not present

## 2021-02-13 DIAGNOSIS — Z992 Dependence on renal dialysis: Secondary | ICD-10-CM | POA: Diagnosis not present

## 2021-02-13 DIAGNOSIS — N186 End stage renal disease: Secondary | ICD-10-CM | POA: Diagnosis not present

## 2021-02-14 DIAGNOSIS — J9610 Chronic respiratory failure, unspecified whether with hypoxia or hypercapnia: Secondary | ICD-10-CM | POA: Diagnosis not present

## 2021-02-14 DIAGNOSIS — E1165 Type 2 diabetes mellitus with hyperglycemia: Secondary | ICD-10-CM | POA: Diagnosis not present

## 2021-02-14 DIAGNOSIS — N186 End stage renal disease: Secondary | ICD-10-CM | POA: Diagnosis not present

## 2021-02-14 DIAGNOSIS — J90 Pleural effusion, not elsewhere classified: Secondary | ICD-10-CM | POA: Diagnosis not present

## 2021-02-15 ENCOUNTER — Other Ambulatory Visit: Payer: Self-pay | Admitting: Podiatry

## 2021-02-15 ENCOUNTER — Telehealth: Payer: Self-pay | Admitting: Podiatry

## 2021-02-15 DIAGNOSIS — I96 Gangrene, not elsewhere classified: Secondary | ICD-10-CM

## 2021-02-15 NOTE — Telephone Encounter (Signed)
Called pt let her know you sent over referral to ID.

## 2021-02-15 NOTE — Telephone Encounter (Signed)
Patient called stating you wanted her to follow up with her infectous disease MD. Patient states she had an appt and cancelled because she was sick. Patient states the MD office is giving her a hard time scheduling her. Patient wanted to know if you could send a referral over there. [facility] infectious Disease MD.

## 2021-02-16 DIAGNOSIS — Z992 Dependence on renal dialysis: Secondary | ICD-10-CM | POA: Diagnosis not present

## 2021-02-16 DIAGNOSIS — N186 End stage renal disease: Secondary | ICD-10-CM | POA: Diagnosis not present

## 2021-02-17 DIAGNOSIS — J9611 Chronic respiratory failure with hypoxia: Secondary | ICD-10-CM | POA: Diagnosis not present

## 2021-02-17 DIAGNOSIS — J449 Chronic obstructive pulmonary disease, unspecified: Secondary | ICD-10-CM | POA: Diagnosis not present

## 2021-02-17 DIAGNOSIS — I5032 Chronic diastolic (congestive) heart failure: Secondary | ICD-10-CM | POA: Diagnosis not present

## 2021-02-19 DIAGNOSIS — I5032 Chronic diastolic (congestive) heart failure: Secondary | ICD-10-CM | POA: Diagnosis not present

## 2021-02-19 DIAGNOSIS — Z794 Long term (current) use of insulin: Secondary | ICD-10-CM | POA: Diagnosis not present

## 2021-02-19 DIAGNOSIS — E1151 Type 2 diabetes mellitus with diabetic peripheral angiopathy without gangrene: Secondary | ICD-10-CM | POA: Diagnosis not present

## 2021-02-19 DIAGNOSIS — N186 End stage renal disease: Secondary | ICD-10-CM | POA: Diagnosis not present

## 2021-02-19 DIAGNOSIS — E1122 Type 2 diabetes mellitus with diabetic chronic kidney disease: Secondary | ICD-10-CM | POA: Diagnosis not present

## 2021-02-19 DIAGNOSIS — Z9981 Dependence on supplemental oxygen: Secondary | ICD-10-CM | POA: Diagnosis not present

## 2021-02-19 DIAGNOSIS — L97511 Non-pressure chronic ulcer of other part of right foot limited to breakdown of skin: Secondary | ICD-10-CM | POA: Diagnosis not present

## 2021-02-19 DIAGNOSIS — I132 Hypertensive heart and chronic kidney disease with heart failure and with stage 5 chronic kidney disease, or end stage renal disease: Secondary | ICD-10-CM | POA: Diagnosis not present

## 2021-02-19 DIAGNOSIS — D631 Anemia in chronic kidney disease: Secondary | ICD-10-CM | POA: Diagnosis not present

## 2021-02-19 DIAGNOSIS — Z992 Dependence on renal dialysis: Secondary | ICD-10-CM | POA: Diagnosis not present

## 2021-02-19 DIAGNOSIS — E1152 Type 2 diabetes mellitus with diabetic peripheral angiopathy with gangrene: Secondary | ICD-10-CM | POA: Diagnosis not present

## 2021-02-19 DIAGNOSIS — E11621 Type 2 diabetes mellitus with foot ulcer: Secondary | ICD-10-CM | POA: Diagnosis not present

## 2021-02-20 DIAGNOSIS — N186 End stage renal disease: Secondary | ICD-10-CM | POA: Diagnosis not present

## 2021-02-20 DIAGNOSIS — Z992 Dependence on renal dialysis: Secondary | ICD-10-CM | POA: Diagnosis not present

## 2021-02-21 DIAGNOSIS — N186 End stage renal disease: Secondary | ICD-10-CM | POA: Diagnosis not present

## 2021-02-21 DIAGNOSIS — D631 Anemia in chronic kidney disease: Secondary | ICD-10-CM | POA: Diagnosis not present

## 2021-02-21 DIAGNOSIS — E1122 Type 2 diabetes mellitus with diabetic chronic kidney disease: Secondary | ICD-10-CM | POA: Diagnosis not present

## 2021-02-21 DIAGNOSIS — Z992 Dependence on renal dialysis: Secondary | ICD-10-CM | POA: Diagnosis not present

## 2021-02-21 DIAGNOSIS — E1151 Type 2 diabetes mellitus with diabetic peripheral angiopathy without gangrene: Secondary | ICD-10-CM | POA: Diagnosis not present

## 2021-02-21 DIAGNOSIS — I132 Hypertensive heart and chronic kidney disease with heart failure and with stage 5 chronic kidney disease, or end stage renal disease: Secondary | ICD-10-CM | POA: Diagnosis not present

## 2021-02-21 DIAGNOSIS — E1152 Type 2 diabetes mellitus with diabetic peripheral angiopathy with gangrene: Secondary | ICD-10-CM | POA: Diagnosis not present

## 2021-02-21 DIAGNOSIS — Z794 Long term (current) use of insulin: Secondary | ICD-10-CM | POA: Diagnosis not present

## 2021-02-21 DIAGNOSIS — Z9981 Dependence on supplemental oxygen: Secondary | ICD-10-CM | POA: Diagnosis not present

## 2021-02-21 DIAGNOSIS — I5032 Chronic diastolic (congestive) heart failure: Secondary | ICD-10-CM | POA: Diagnosis not present

## 2021-02-21 DIAGNOSIS — E11621 Type 2 diabetes mellitus with foot ulcer: Secondary | ICD-10-CM | POA: Diagnosis not present

## 2021-02-21 DIAGNOSIS — L97511 Non-pressure chronic ulcer of other part of right foot limited to breakdown of skin: Secondary | ICD-10-CM | POA: Diagnosis not present

## 2021-02-22 ENCOUNTER — Other Ambulatory Visit: Payer: Self-pay

## 2021-02-22 ENCOUNTER — Ambulatory Visit: Payer: Medicare Other | Attending: Infectious Diseases | Admitting: Infectious Diseases

## 2021-02-22 ENCOUNTER — Other Ambulatory Visit
Admission: RE | Admit: 2021-02-22 | Discharge: 2021-02-22 | Disposition: A | Discharge status: Home / Self Care | Payer: Medicare Other | Attending: Infectious Diseases | Admitting: Infectious Diseases

## 2021-02-22 VITALS — BP 113/76 | HR 104 | Temp 98.6°F | Resp 16 | Ht 64.5 in | Wt 268.0 lb

## 2021-02-22 DIAGNOSIS — E1122 Type 2 diabetes mellitus with diabetic chronic kidney disease: Secondary | ICD-10-CM | POA: Diagnosis not present

## 2021-02-22 DIAGNOSIS — I5032 Chronic diastolic (congestive) heart failure: Secondary | ICD-10-CM | POA: Insufficient documentation

## 2021-02-22 DIAGNOSIS — Z9981 Dependence on supplemental oxygen: Secondary | ICD-10-CM | POA: Insufficient documentation

## 2021-02-22 DIAGNOSIS — I96 Gangrene, not elsewhere classified: Secondary | ICD-10-CM | POA: Insufficient documentation

## 2021-02-22 DIAGNOSIS — E662 Morbid (severe) obesity with alveolar hypoventilation: Secondary | ICD-10-CM | POA: Diagnosis not present

## 2021-02-22 DIAGNOSIS — D500 Iron deficiency anemia secondary to blood loss (chronic): Secondary | ICD-10-CM | POA: Diagnosis not present

## 2021-02-22 DIAGNOSIS — Z992 Dependence on renal dialysis: Secondary | ICD-10-CM | POA: Insufficient documentation

## 2021-02-22 DIAGNOSIS — N186 End stage renal disease: Secondary | ICD-10-CM | POA: Insufficient documentation

## 2021-02-22 DIAGNOSIS — E1142 Type 2 diabetes mellitus with diabetic polyneuropathy: Secondary | ICD-10-CM | POA: Diagnosis not present

## 2021-02-22 DIAGNOSIS — J90 Pleural effusion, not elsewhere classified: Secondary | ICD-10-CM | POA: Diagnosis not present

## 2021-02-22 DIAGNOSIS — Z6841 Body Mass Index (BMI) 40.0 and over, adult: Secondary | ICD-10-CM | POA: Insufficient documentation

## 2021-02-22 DIAGNOSIS — L089 Local infection of the skin and subcutaneous tissue, unspecified: Secondary | ICD-10-CM

## 2021-02-22 DIAGNOSIS — E1152 Type 2 diabetes mellitus with diabetic peripheral angiopathy with gangrene: Secondary | ICD-10-CM | POA: Insufficient documentation

## 2021-02-22 DIAGNOSIS — I132 Hypertensive heart and chronic kidney disease with heart failure and with stage 5 chronic kidney disease, or end stage renal disease: Secondary | ICD-10-CM | POA: Diagnosis not present

## 2021-02-22 DIAGNOSIS — E11628 Type 2 diabetes mellitus with other skin complications: Secondary | ICD-10-CM | POA: Insufficient documentation

## 2021-02-22 DIAGNOSIS — J45909 Unspecified asthma, uncomplicated: Secondary | ICD-10-CM | POA: Diagnosis not present

## 2021-02-22 DIAGNOSIS — M868X7 Other osteomyelitis, ankle and foot: Secondary | ICD-10-CM | POA: Insufficient documentation

## 2021-02-22 DIAGNOSIS — Z89421 Acquired absence of other right toe(s): Secondary | ICD-10-CM | POA: Diagnosis not present

## 2021-02-22 LAB — CBC WITH DIFFERENTIAL/PLATELET
Abs Immature Granulocytes: 0.02 10*3/uL (ref 0.00–0.07)
Basophils Absolute: 0 10*3/uL (ref 0.0–0.1)
Basophils Relative: 1 %
Eosinophils Absolute: 0.2 10*3/uL (ref 0.0–0.5)
Eosinophils Relative: 3 %
HCT: 35.3 % — ABNORMAL LOW (ref 36.0–46.0)
Hemoglobin: 10.7 g/dL — ABNORMAL LOW (ref 12.0–15.0)
Immature Granulocytes: 0 %
Lymphocytes Relative: 15 %
Lymphs Abs: 0.9 10*3/uL (ref 0.7–4.0)
MCH: 28.7 pg (ref 26.0–34.0)
MCHC: 30.3 g/dL (ref 30.0–36.0)
MCV: 94.6 fL (ref 80.0–100.0)
Monocytes Absolute: 0.5 10*3/uL (ref 0.1–1.0)
Monocytes Relative: 8 %
Neutro Abs: 4.5 10*3/uL (ref 1.7–7.7)
Neutrophils Relative %: 73 %
Platelets: 341 10*3/uL (ref 150–400)
RBC: 3.73 MIL/uL — ABNORMAL LOW (ref 3.87–5.11)
RDW: 13.8 % (ref 11.5–15.5)
Smear Review: NORMAL
WBC: 6.1 10*3/uL (ref 4.0–10.5)
nRBC: 0 % (ref 0.0–0.2)

## 2021-02-22 LAB — COMPREHENSIVE METABOLIC PANEL
ALT: 24 U/L (ref 0–44)
AST: 20 U/L (ref 15–41)
Albumin: 3.4 g/dL — ABNORMAL LOW (ref 3.5–5.0)
Alkaline Phosphatase: 395 U/L — ABNORMAL HIGH (ref 38–126)
Anion gap: 11 (ref 5–15)
BUN: 24 mg/dL — ABNORMAL HIGH (ref 6–20)
CO2: 29 mmol/L (ref 22–32)
Calcium: 8.6 mg/dL — ABNORMAL LOW (ref 8.9–10.3)
Chloride: 96 mmol/L — ABNORMAL LOW (ref 98–111)
Creatinine, Ser: 5.07 mg/dL — ABNORMAL HIGH (ref 0.44–1.00)
GFR, Estimated: 10 mL/min — ABNORMAL LOW (ref >60)
Glucose, Bld: 180 mg/dL — ABNORMAL HIGH (ref 70–99)
Potassium: 3.7 mmol/L (ref 3.5–5.1)
Sodium: 136 mmol/L (ref 135–145)
Total Bilirubin: 0.8 mg/dL (ref 0.3–1.2)
Total Protein: 7.9 g/dL (ref 6.5–8.1)

## 2021-02-22 LAB — C-REACTIVE PROTEIN: CRP: 2.7 mg/dL — ABNORMAL HIGH (ref <1.0)

## 2021-02-22 LAB — SEDIMENTATION RATE: Sed Rate: 77 mm/hr — ABNORMAL HIGH (ref 0–20)

## 2021-02-22 NOTE — Patient Instructions (Addendum)
Today you came to followup on the rt foot infection for which you took a long course of Iv antibiotics and you have been off for more than 2 months- you have been stable- watch out for any fveer, new discharge pain rt leg -will do labs today   follow up as needed

## 2021-02-22 NOTE — Progress Notes (Signed)
NAME: [name]  DOB: 09-24-1970  MRN: [id number]  Date/Time: 02/22/2021 1:04 PM  Pt is here with her son Follow up of the rt foot infection Last seen on 11/21/20 t ? [name] is a 50 y.o. with a history of DM, DFI, ESRD on dialysis , CHF, HTN,   Diabetic foot infection- necrotic with osteomyelitis- amputation of fifth ray and I&D on 8/27 -[place] procedure on 10/26/2020 -Recommended amputation which patient had declined  -Took  IV vancomycin and cefepime with dialysis -for 8 weeks until 12/19/20 The nurse comes home to do the dressing and her cousin helps as well Saw Dr.Gonzalez on 01/24/21 for SOB and had rt pleural effusion- IR drained 1.2 liters of fluid that day and it was not infection-Dr.Gonzalez thought it was a combination of heart, kidney and possibly the rt foot gangrene was contributing to the fluid build up in the lung So patient is her to get her foot reassessed No fever No discharge The foot is being dressed by nurse and cousin She has not been walking She has been tired   Past Medical History:  Diagnosis Date   (HFpEF) heart failure with preserved ejection fraction ([place])    a. 11/2014 Echo: EF 50-55%, Gr1 DD; b. 06/2016 Echo: EF 60-65%, no rwma, mod dil LA, nl RV, triv eff.   Anxiety    Asthma    Bronchitis    CHF (congestive heart failure) (HCC)    Chronic cough    Chronic respiratory failure with hypoxia and hypercapnia ([place])    a. Home O2.  (2-4L)   CKD (chronic kidney disease), stage III (HCC)    Depression    Diabetes mellitus without complication (HCC)    Diverticulosis    Dyspnea    Dysrhythmia    per pt, no arrythmias   Environmental and seasonal allergies    Extreme obesity with alveolar hypoventilation (HCC)    Hypertension    Iron deficiency anemia    a. chronic blood loss->heavy menses.   Kidney failure    dialysis tues/thur/sat   ... left arm shunt   Orthopnea    Oxygen dependent    3l/min   Pericardial effusion    a. 11/2014  CT Chest: Mod effusion; b. 11/2014 Echo: small to mod circumferential effusion. No hemodynamic compromise.   Pneumonia    history   Poorly controlled diabetes mellitus ([place])    a. 11/2014 A1c 13.2.   Swelling of both lower extremities     Past Surgical History:  Procedure Laterality Date   A/V FISTULAGRAM Left 06/09/2017   Procedure: A/V FISTULAGRAM;  Surgeon: Algernon Huxley, MD;  Location: [facility];  Service: Cardiovascular;  Laterality: Left;   A/V FISTULAGRAM Left 06/16/2017   Procedure: A/V FISTULAGRAM;  Surgeon: Algernon Huxley, MD;  Location: [facility];  Service: Cardiovascular;  Laterality: Left;   A/V FISTULAGRAM Left 01/28/2018   Procedure: A/V FISTULAGRAM;  Surgeon: Algernon Huxley, MD;  Location: [facility];  Service: Cardiovascular;  Laterality: Left;   AMPUTATION TOE Right 10/21/2020   Procedure: AMPUTATION TOE-5th Digit Amputation Right Foot;  Surgeon: Edrick Kins, DPM;  Location: [facility];  Service: Podiatry;  Laterality: Right;   AV FISTULA PLACEMENT Left 04/25/2017   Procedure: ARTERIOVENOUS (AV) FISTULA CREATION;  Surgeon: Algernon Huxley, MD;  Location: [facility];  Service: Vascular;  Laterality: Left;   CATARACT EXTRACTION W/PHACO Left 01/21/2018   Procedure: CATARACT EXTRACTION PHACO AND INTRAOCULAR LENS  PLACEMENT (IOC);  Surgeon: Leandrew Koyanagi, MD;  Location: [facility];  Service: Ophthalmology;  Laterality: Left;  [place] 01:55.5 CDE 9.05 EAUP 13.9 Fluid Pack Lot # [id number]   CESAREAN SECTION     times 3   DIALYSIS/PERMA CATHETER INSERTION Right 04/04/2017   Procedure: DIALYSIS/PERMA CATHETER INSERTION;  Surgeon: Katha Cabal, MD;  Location: [facility];  Service: Cardiovascular;  Laterality: Right;   DIALYSIS/PERMA CATHETER REMOVAL N/A 09/17/2017   Procedure: DIALYSIS/PERMA CATHETER REMOVAL;  Surgeon: Algernon Huxley, MD;  Location: [facility];  Service: Cardiovascular;  Laterality: N/A;   INCISION AND DRAINAGE Right 10/26/2020    Procedure: INCISION AND DRAINAGE;  Surgeon: [name], DPM;  Location: [facility];  Service: Podiatry;  Laterality: Right;   INSERTION OF DIALYSIS CATHETER N/A 04/16/2017   Procedure: INSERTION OF DIALYSIS PERM CATHETER;  Surgeon: Algernon Huxley, MD;  Location: [facility];  Service: Vascular;  Laterality: N/A;   LOWER EXTREMITY ANGIOGRAPHY Right 10/18/2020   Procedure: Lower Extremity Angiography;  Surgeon: Algernon Huxley, MD;  Location: [facility];  Service: Cardiovascular;  Laterality: Right;    Social History   Socioeconomic History   Marital status: Married    Spouse name: abjul   Number of children: Not on file   Years of education: 12   Highest education level: High school graduate  Occupational History   Occupation: Disabled  Tobacco Use   Smoking status: Never   Smokeless tobacco: Never  Vaping Use   Vaping Use: Never used  Substance and Sexual Activity   Alcohol use: Not Currently    Comment: occasional   Drug use: Yes    Types: Marijuana   Sexual activity: Not Currently    Birth control/protection: None  Other Topics Concern   Not on file  Social History Narrative   Lives in [place] with her husband and 49 y/o child.  Three other children are grown and out of the house.  She is sedentary and does not routinely exercise.  On home O2.   Social Determinants of Health   Financial Resource Strain: Not on file  Food Insecurity: Not on file  Transportation Needs: Not on file  Physical Activity: Not on file  Stress: Not on file  Social Connections: Not on file  Intimate Partner Violence: Not on file    Family History  Problem Relation Age of Onset   Diabetes Mother    Hypertension Mother    Hypertension Father    Diabetes Father    Hypertension Other    Diabetes Other    Breast cancer Maternal Aunt 61   Breast cancer Maternal Aunt 50   Allergies  Allergen Reactions   Dust Mite Extract Shortness Of Breath and Itching   Mold Extract [Trichophyton]  Shortness Of Breath and Itching   Pollen Extract Shortness Of Breath    Itching, shortness of breath, asthma like symptoms   Sulfa Antibiotics Itching   Feraheme [Ferumoxytol] Itching    Uses benadryl so she can get the medicine   I? Current Outpatient Medications  Medication Sig Dispense Refill   acetaminophen (TYLENOL) 500 MG tablet Take 1,000 mg by mouth every 6 (six) hours as needed for mild pain, fever or headache.     albuterol (PROVENTIL) (2.5 MG/3ML) 0.083% nebulizer solution Take 3 mLs (2.5 mg total) by nebulization every 6 (six) hours as needed for wheezing or shortness of breath. 75 mL 12   albuterol (VENTOLIN HFA) 108 (90 Base) MCG/ACT  inhaler Inhale 2 puffs into the lungs every 4 (four) hours as needed for wheezing or shortness of breath. 18 g 2   aspirin EC 81 MG tablet Take 81 mg by mouth daily.      b complex-vitamin c-folic acid (NEPHRO-VITE) 0.8 MG TABS tablet Take 1 tablet by mouth at bedtime.     Cholecalciferol 100 MCG (4000 UT) CAPS Take 1 capsule by mouth once a week.     clindamycin (CLEOCIN T) 1 % lotion Apply 1 application topically daily.     docusate sodium (COLACE) 100 MG capsule Take 100 mg by mouth daily.     ferric citrate (AURYXIA) 1 GM 210 MG(Fe) tablet Take 210 mg by mouth 3 (three) times daily with meals.     formoterol (PERFOROMIST) 20 MCG/2ML nebulizer solution      insulin aspart (NOVOLOG) 100 UNIT/ML injection Inject 0-5 Units into the skin at bedtime. 10 mL 11   insulin aspart (NOVOLOG) 100 UNIT/ML injection Inject 0-15 Units into the skin 3 (three) times daily with meals. 10 mL 11   insulin degludec (TRESIBA) 100 UNIT/ML FlexTouch Pen Inject 43 Units into the skin daily at 10 pm.     ipratropium-albuterol (DUONEB) 0.5-2.5 (3) MG/3ML SOLN Take 3 mLs by nebulization every 6 (six) hours as needed. 360 mL 5   lactulose (CHRONULAC) 10 GM/15ML solution Take 30 mLs (20 g total) by mouth daily as needed for severe constipation. 240 mL 0    lidocaine-prilocaine (EMLA) cream Apply topically as needed (For access for dialysis).     LINZESS 290 MCG CAPS capsule Take 290 mcg by mouth every morning.     metoprolol tartrate (LOPRESSOR) 25 MG tablet Take 1 tablet (25 mg total) by mouth 2 (two) times daily. 30 tablet 0   midodrine (PROAMATINE) 10 MG tablet Take 20 mg by mouth daily as needed.     omeprazole (PRILOSEC) 20 MG capsule Take 20 mg by mouth daily.     OXYGEN Inhale 2-4 L into the lungs.     revefenacin (YUPELRI) 175 MCG/3ML nebulizer solution      budesonide (PULMICORT) 0.25 MG/2ML nebulizer solution Take 2 mLs (0.25 mg total) by nebulization 2 (two) times daily. (Patient not taking: Reported on 02/22/2021) 60 mL 6   levocetirizine (XYZAL) 5 MG tablet Take 5 mg by mouth at bedtime. (Patient not taking: Reported on 02/22/2021)     Omega-3 Fatty Acids ([employer] NATURAL FISH OIL) 1000 MG CAPS 2 mL 2 TIMES DAILY (route: inhalation) (Patient not taking: Reported on 02/22/2021)     oxyCODONE-acetaminophen (PERCOCET) 5-325 MG tablet Take 1 tablet by mouth every 4 (four) hours as needed for severe pain. (Patient not taking: Reported on 02/22/2021) 12 tablet 0   No current facility-administered medications for this visit.     Abtx:  Anti-infectives (From admission, onward)    None       REVIEW OF SYSTEMS:  Const: negative fever, negative chills, weight gain Eyes: negative diplopia or visual changes, negative eye pain ENT: negative coryza, negative sore throat Resp: negative cough, hemoptysis, has  dyspnea and on oxygen Cards: negative for chest pain, palpitations, has  lower extremity edema GU: negative for frequency, dysuria and hematuria GI: Negative for abdominal pain, diarrhea, bleeding, constipation Skin: negative for rash and pruritus Heme: negative for easy bruising and gum/nose bleeding MS: general weakness Neurolo: headaches, poor sleep  Endocrine: , diabetes Allergy/Immunology- as above Objective:  VITALS:  BP  113/76    Pulse (!) 104  Temp 98.6 F (37 C)    Resp 16    Ht 5' 4.5" (1.638 m)    Wt 268 lb (121.6 kg)    LMP 09/06/2016 (Approximate)    SpO2 91%    BMI 45.29 kg/m  PHYSICAL EXAM:  General: Alert, cooperative, no distress, appears stated age.  On nasal cannula oxygen Head: Normocephalic, without obvious abnormality, atraumatic. Eyes: Conjunctivae clear, anicteric sclerae. Pupils are equal ENT did not examine due to mask Lungs: b/l air entry- crepts bases. Heart: Regular rate and rhythm, no murmur, rub or gallop. Abdomen: did not examine as she was in wheel chair Extremities:r tfoot- dry gangrene, shriveling Two wounds dry- they have been packed with betadine      The foot does not have any signs of acute infection Skin: hyperpigmented papules over face Lymph: Cervical, supraclavicular normal. Neurologic:peripheral neuropathy Pertinent Labs none   ? Impression/Recommendation ?? Diabetes mellitus with rt foot necrotic infection- s/p ray amputation of 5th toe and I/D of complex abscess with debridement in Aug 2022- she declined amputation and wanted to take a chance with Iv antibiotics-  received 8 weeks of Vanco and cefepime and completed on 12/19/20- The acute infection resolved but now the patient has dry gangrene of the foot with chronic nonhealing desiccated wounds which do not have any signs of acute infection No need for  any antibiotics but continue with betadine dressing like she is doing. Explained to her to look for any signs of infection including fever, drainage from the wound' Will check ESR/CRP/CBC as requested by her- The ESR can be high as she is a dialysis patient  She is following up with podiatrist   DM  ESRD on dialysis  Asthma /obesity hypoventilation syndrome  On oxygen supplementation Recent rt pleural effusion- s/p thoracentesis on 11/30  Anemia  CHF   ___________________________________________________ Discussed with patient,and son in  detail Will inform her of the results Follow PRN

## 2021-02-23 ENCOUNTER — Encounter: Payer: Self-pay | Admitting: Oncology

## 2021-02-23 DIAGNOSIS — D631 Anemia in chronic kidney disease: Secondary | ICD-10-CM | POA: Diagnosis not present

## 2021-02-23 DIAGNOSIS — Z992 Dependence on renal dialysis: Secondary | ICD-10-CM | POA: Diagnosis not present

## 2021-02-23 DIAGNOSIS — E1122 Type 2 diabetes mellitus with diabetic chronic kidney disease: Secondary | ICD-10-CM | POA: Diagnosis not present

## 2021-02-23 DIAGNOSIS — E11621 Type 2 diabetes mellitus with foot ulcer: Secondary | ICD-10-CM | POA: Diagnosis not present

## 2021-02-23 DIAGNOSIS — E1151 Type 2 diabetes mellitus with diabetic peripheral angiopathy without gangrene: Secondary | ICD-10-CM | POA: Diagnosis not present

## 2021-02-23 DIAGNOSIS — Z9981 Dependence on supplemental oxygen: Secondary | ICD-10-CM | POA: Diagnosis not present

## 2021-02-23 DIAGNOSIS — E1152 Type 2 diabetes mellitus with diabetic peripheral angiopathy with gangrene: Secondary | ICD-10-CM | POA: Diagnosis not present

## 2021-02-23 DIAGNOSIS — I132 Hypertensive heart and chronic kidney disease with heart failure and with stage 5 chronic kidney disease, or end stage renal disease: Secondary | ICD-10-CM | POA: Diagnosis not present

## 2021-02-23 DIAGNOSIS — N186 End stage renal disease: Secondary | ICD-10-CM | POA: Diagnosis not present

## 2021-02-23 DIAGNOSIS — L97511 Non-pressure chronic ulcer of other part of right foot limited to breakdown of skin: Secondary | ICD-10-CM | POA: Diagnosis not present

## 2021-02-23 DIAGNOSIS — Z794 Long term (current) use of insulin: Secondary | ICD-10-CM | POA: Diagnosis not present

## 2021-02-23 DIAGNOSIS — I5032 Chronic diastolic (congestive) heart failure: Secondary | ICD-10-CM | POA: Diagnosis not present

## 2021-02-24 DIAGNOSIS — Z992 Dependence on renal dialysis: Secondary | ICD-10-CM | POA: Diagnosis not present

## 2021-02-24 DIAGNOSIS — L97513 Non-pressure chronic ulcer of other part of right foot with necrosis of muscle: Secondary | ICD-10-CM | POA: Diagnosis not present

## 2021-02-24 DIAGNOSIS — N186 End stage renal disease: Secondary | ICD-10-CM | POA: Diagnosis not present

## 2021-02-25 DIAGNOSIS — I5040 Unspecified combined systolic (congestive) and diastolic (congestive) heart failure: Secondary | ICD-10-CM | POA: Diagnosis not present

## 2021-02-25 DIAGNOSIS — J45909 Unspecified asthma, uncomplicated: Secondary | ICD-10-CM | POA: Diagnosis not present

## 2021-02-25 DIAGNOSIS — N186 End stage renal disease: Secondary | ICD-10-CM | POA: Diagnosis not present

## 2021-02-25 DIAGNOSIS — Z992 Dependence on renal dialysis: Secondary | ICD-10-CM | POA: Diagnosis not present

## 2021-02-26 DIAGNOSIS — N186 End stage renal disease: Secondary | ICD-10-CM | POA: Diagnosis not present

## 2021-02-26 DIAGNOSIS — E1122 Type 2 diabetes mellitus with diabetic chronic kidney disease: Secondary | ICD-10-CM | POA: Diagnosis not present

## 2021-02-26 DIAGNOSIS — D631 Anemia in chronic kidney disease: Secondary | ICD-10-CM | POA: Diagnosis not present

## 2021-02-26 DIAGNOSIS — Z794 Long term (current) use of insulin: Secondary | ICD-10-CM | POA: Diagnosis not present

## 2021-02-26 DIAGNOSIS — E11621 Type 2 diabetes mellitus with foot ulcer: Secondary | ICD-10-CM | POA: Diagnosis not present

## 2021-02-26 DIAGNOSIS — L97511 Non-pressure chronic ulcer of other part of right foot limited to breakdown of skin: Secondary | ICD-10-CM | POA: Diagnosis not present

## 2021-02-26 DIAGNOSIS — E1151 Type 2 diabetes mellitus with diabetic peripheral angiopathy without gangrene: Secondary | ICD-10-CM | POA: Diagnosis not present

## 2021-02-26 DIAGNOSIS — Z9981 Dependence on supplemental oxygen: Secondary | ICD-10-CM | POA: Diagnosis not present

## 2021-02-26 DIAGNOSIS — I132 Hypertensive heart and chronic kidney disease with heart failure and with stage 5 chronic kidney disease, or end stage renal disease: Secondary | ICD-10-CM | POA: Diagnosis not present

## 2021-02-26 DIAGNOSIS — I5032 Chronic diastolic (congestive) heart failure: Secondary | ICD-10-CM | POA: Diagnosis not present

## 2021-02-26 DIAGNOSIS — Z992 Dependence on renal dialysis: Secondary | ICD-10-CM | POA: Diagnosis not present

## 2021-02-26 DIAGNOSIS — E1152 Type 2 diabetes mellitus with diabetic peripheral angiopathy with gangrene: Secondary | ICD-10-CM | POA: Diagnosis not present

## 2021-02-27 DIAGNOSIS — N186 End stage renal disease: Secondary | ICD-10-CM | POA: Diagnosis not present

## 2021-02-27 DIAGNOSIS — Z992 Dependence on renal dialysis: Secondary | ICD-10-CM | POA: Diagnosis not present

## 2021-02-27 LAB — FUNGUS CULTURE RESULT

## 2021-02-27 LAB — FUNGAL ORGANISM REFLEX

## 2021-02-27 LAB — FUNGUS CULTURE WITH STAIN

## 2021-02-28 ENCOUNTER — Encounter: Payer: Self-pay | Admitting: Pulmonary Disease

## 2021-02-28 ENCOUNTER — Encounter: Payer: Self-pay | Admitting: Oncology

## 2021-02-28 ENCOUNTER — Ambulatory Visit
Admission: RE | Admit: 2021-02-28 | Discharge: 2021-02-28 | Disposition: A | Discharge status: Home / Self Care | Payer: Medicare Other | Source: Ambulatory Visit | Attending: Pulmonary Disease | Admitting: Pulmonary Disease

## 2021-02-28 ENCOUNTER — Ambulatory Visit (INDEPENDENT_AMBULATORY_CARE_PROVIDER_SITE_OTHER): Payer: Commercial Managed Care - HMO | Admitting: Pulmonary Disease

## 2021-02-28 ENCOUNTER — Other Ambulatory Visit: Payer: Self-pay

## 2021-02-28 VITALS — BP 118/70 | HR 111 | Temp 97.1°F | Ht 64.5 in | Wt 261.0 lb

## 2021-02-28 DIAGNOSIS — J9811 Atelectasis: Secondary | ICD-10-CM | POA: Diagnosis not present

## 2021-02-28 DIAGNOSIS — J9612 Chronic respiratory failure with hypercapnia: Secondary | ICD-10-CM

## 2021-02-28 DIAGNOSIS — E11621 Type 2 diabetes mellitus with foot ulcer: Secondary | ICD-10-CM | POA: Diagnosis not present

## 2021-02-28 DIAGNOSIS — I5032 Chronic diastolic (congestive) heart failure: Secondary | ICD-10-CM | POA: Diagnosis not present

## 2021-02-28 DIAGNOSIS — J90 Pleural effusion, not elsewhere classified: Secondary | ICD-10-CM

## 2021-02-28 DIAGNOSIS — Z794 Long term (current) use of insulin: Secondary | ICD-10-CM | POA: Diagnosis not present

## 2021-02-28 DIAGNOSIS — I96 Gangrene, not elsewhere classified: Secondary | ICD-10-CM | POA: Diagnosis not present

## 2021-02-28 DIAGNOSIS — Z9981 Dependence on supplemental oxygen: Secondary | ICD-10-CM | POA: Diagnosis not present

## 2021-02-28 DIAGNOSIS — J4550 Severe persistent asthma, uncomplicated: Secondary | ICD-10-CM | POA: Diagnosis not present

## 2021-02-28 DIAGNOSIS — J9611 Chronic respiratory failure with hypoxia: Secondary | ICD-10-CM | POA: Diagnosis not present

## 2021-02-28 DIAGNOSIS — R400 Somnolence: Secondary | ICD-10-CM

## 2021-02-28 DIAGNOSIS — N186 End stage renal disease: Secondary | ICD-10-CM | POA: Diagnosis not present

## 2021-02-28 DIAGNOSIS — I517 Cardiomegaly: Secondary | ICD-10-CM | POA: Diagnosis not present

## 2021-02-28 DIAGNOSIS — D631 Anemia in chronic kidney disease: Secondary | ICD-10-CM | POA: Diagnosis not present

## 2021-02-28 DIAGNOSIS — E1122 Type 2 diabetes mellitus with diabetic chronic kidney disease: Secondary | ICD-10-CM | POA: Diagnosis not present

## 2021-02-28 DIAGNOSIS — L97511 Non-pressure chronic ulcer of other part of right foot limited to breakdown of skin: Secondary | ICD-10-CM | POA: Diagnosis not present

## 2021-02-28 DIAGNOSIS — E1151 Type 2 diabetes mellitus with diabetic peripheral angiopathy without gangrene: Secondary | ICD-10-CM | POA: Diagnosis not present

## 2021-02-28 DIAGNOSIS — E1152 Type 2 diabetes mellitus with diabetic peripheral angiopathy with gangrene: Secondary | ICD-10-CM | POA: Diagnosis not present

## 2021-02-28 DIAGNOSIS — Z992 Dependence on renal dialysis: Secondary | ICD-10-CM | POA: Diagnosis not present

## 2021-02-28 DIAGNOSIS — I132 Hypertensive heart and chronic kidney disease with heart failure and with stage 5 chronic kidney disease, or end stage renal disease: Secondary | ICD-10-CM | POA: Diagnosis not present

## 2021-02-28 IMAGING — CR DG CHEST 2V
1 series · 2 of 2 positions shown · non-contrast
Comparison: [DATE]

CLINICAL DATA: Follow-up pleural effusion

EXAM:
CHEST - 2 VIEW

[Series 1: dg chest 2 view · 0.14mm/px · 2 of 2 slices shown]
[im 1/2]
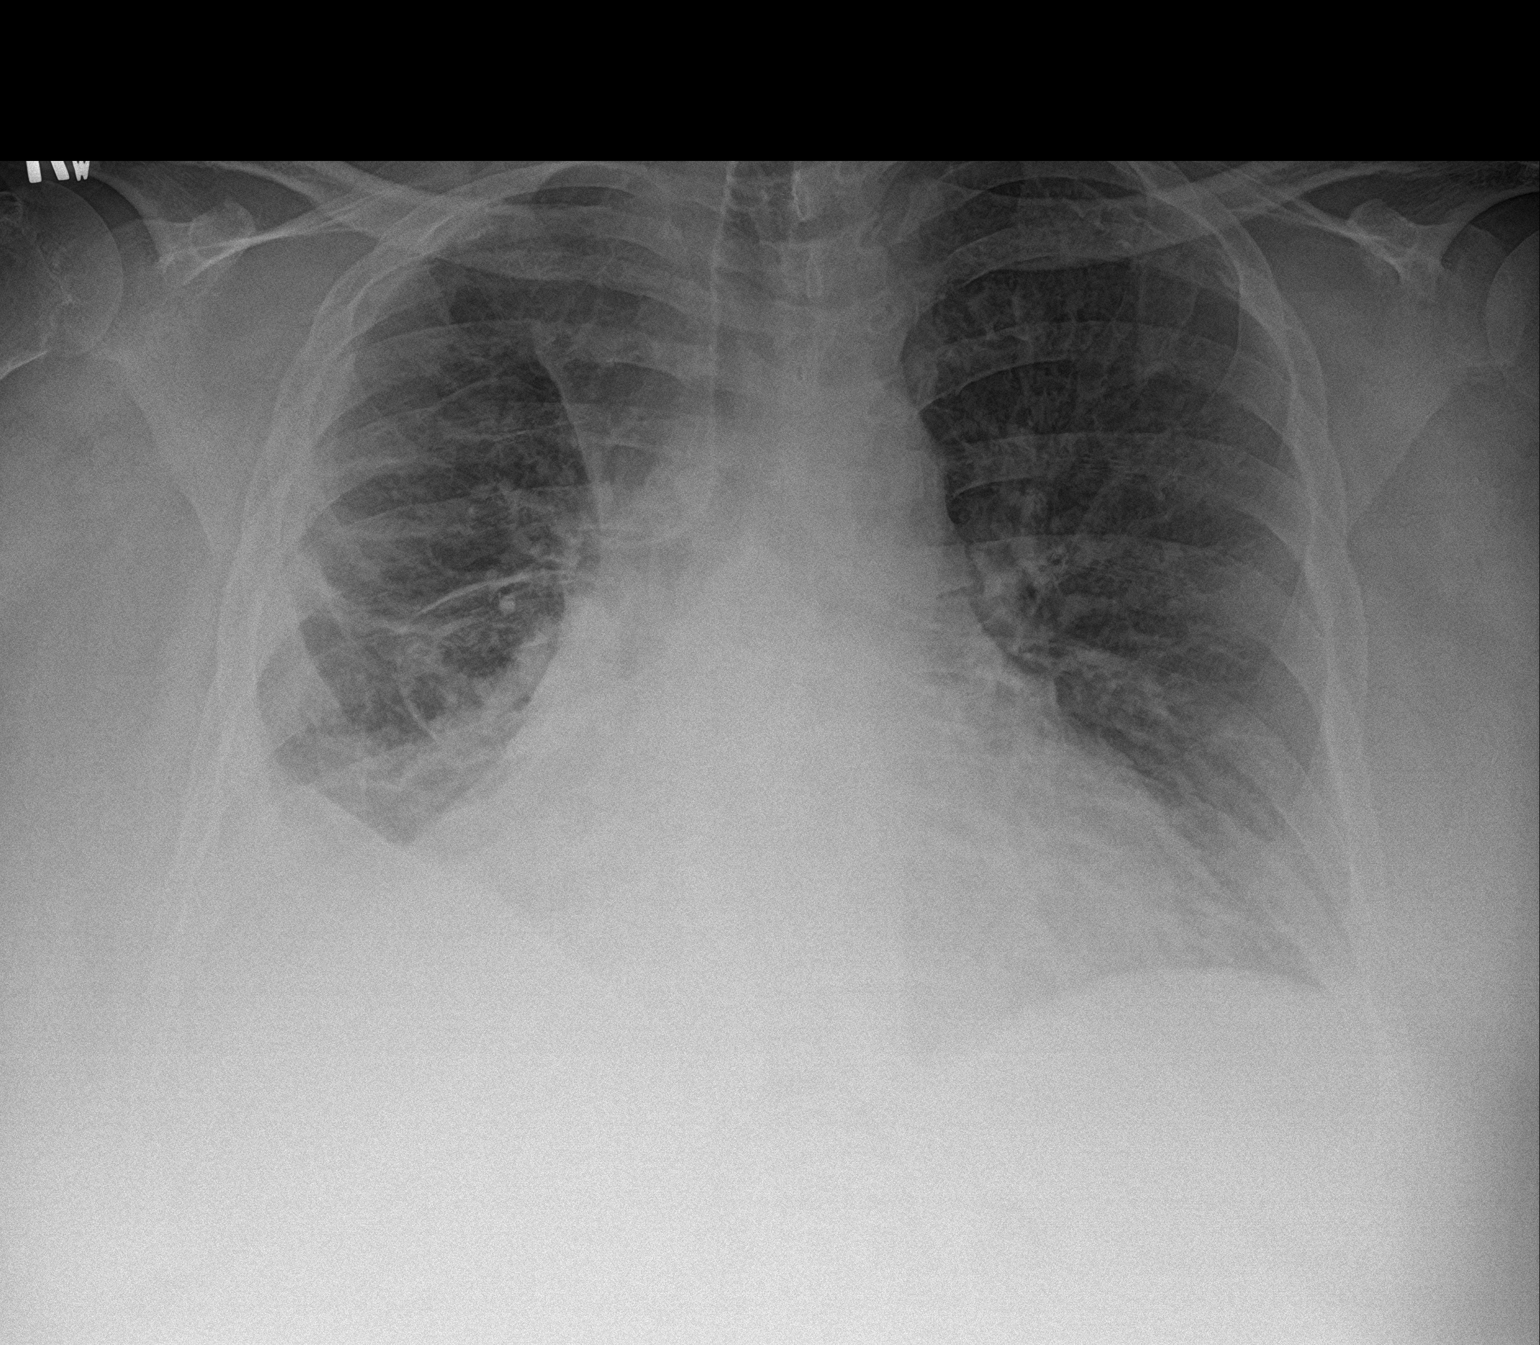
[im 2/2]
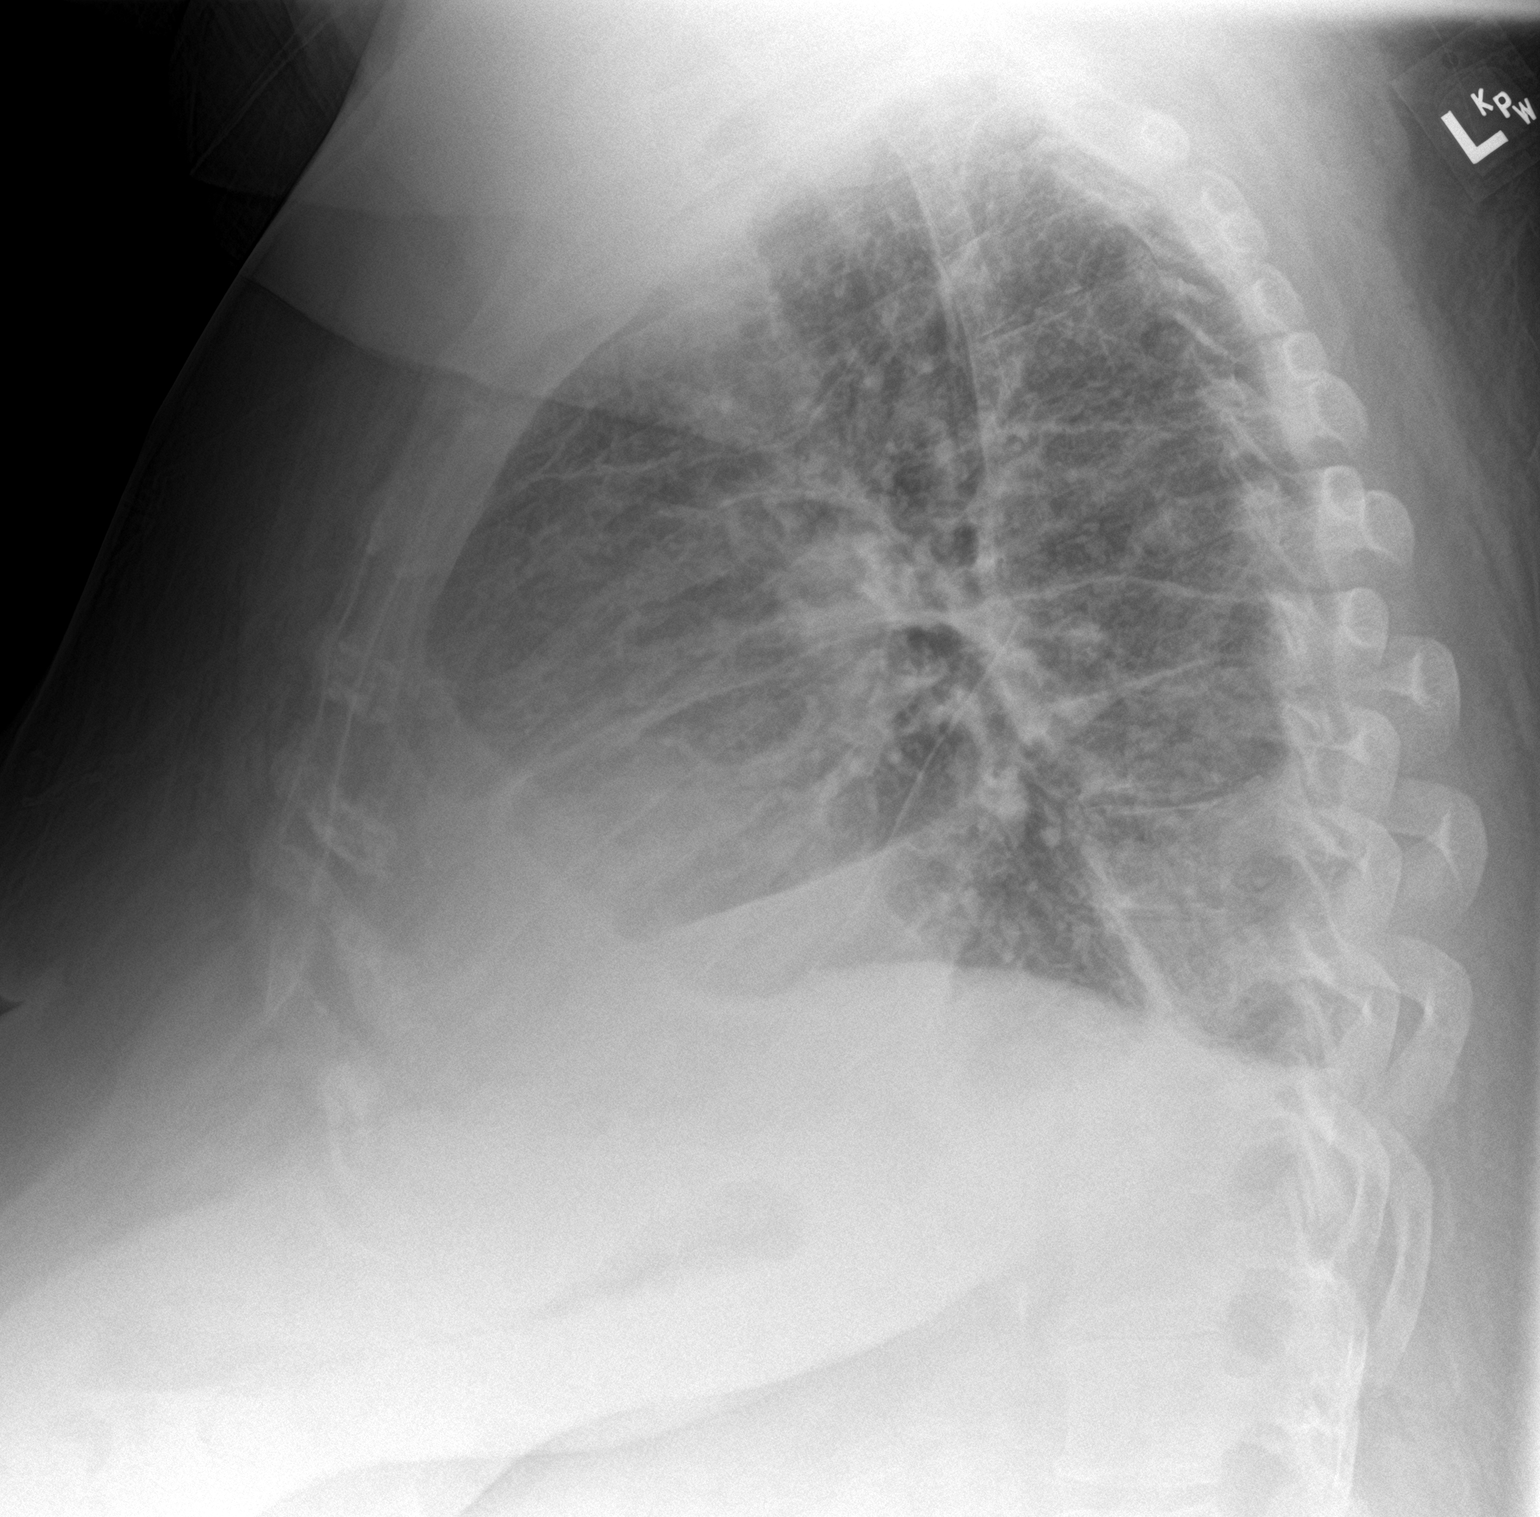

[2 of 2 positions shown; findings below may reference images not displayed]

FINDINGS: Cardiomegaly as seen previously. Small amount of pleural fluid in
the posterior costophrenic angle on the left. Persistent pleural
density on the right that could be a combination of small to
moderate effusion and pleural scarring. No worsening or new finding.
The lung apices are well aerated.
IMPRESSION: Persistent pleural density on the right that could be a combination
of pleural fluid and pleural scarring. Some associated atelectasis
in the lower right lung. Mild blunting of the posterior costophrenic
angle on the left.

## 2021-02-28 MED ORDER — ALBUTEROL SULFATE (2.5 MG/3ML) 0.083% IN NEBU: 2.5000 mg | INHALATION_SOLUTION | Freq: Four times a day (QID) | RESPIRATORY_TRACT | 12 refills | Status: DC | PRN

## 2021-02-28 NOTE — Progress Notes (Signed)
Subjective:    Patient ID: [name], female    DOB: 06/11/1970, 51 y.o.   MRN: [id number] Chief Complaint  Patient presents with   Follow-up   HPI Patient is a 51 year old morbidly obese lifelong never smoker, with a history of ESRD who presents for follow-up on the issue of right pleural effusion and shortness of breath.  Last evaluated here 24 January 2021.  At that time she was noted to have a right loculated pleural effusion that had been present since at least September.  It had been tapped previously with no specific etiology.  At that time also we optimized her nebulizer therapy.  The patient had been sent home on Perforomist and Yupelri, budesonide was added and her rescue medic Acacian switched to albuterol alone (had been on DuoNeb but this offered duplication of medications with Maretta Bees).  At her last visit she underwent thoracentesis by IR, a total of 1.2 L of clear yellow fluid was removed.  She was of this fluid were negative, cell count showed only 343 cells 70% of which were lymphs indicating chronicity of the fluid.  Cytology showed lymphocytic predominant effusion but no malignancy.  Fluid was mostly transudative/pseudo exudative due to chronicity.  Since her thoracentesis she has noted improvement on her shortness of breath.  Also improvement in her shortness of breath with her new nebulizer regimen.  Overall she feels improved in this regard.  She does not endorse any new complaint today.  She continues to go to infectious disease clinic due to a gangrenous right foot that she refuses to have amputated.  Has been compliant with dialysis Tuesday Thursday Saturdays and apparently her target weights have been adjusted.  We went over her nebulizer regimen closely again today and created a chart for the patient.  Patient also describes daytime sleepiness.  Noted to have snoring as well during the night.   Review of Systems A 10 point review of systems was performed and it is as  noted above otherwise negative.  Patient Active Problem List   Diagnosis Date Noted   Acute on chronic diastolic CHF (congestive heart failure) ([place]) 11/14/2020   Anemia of chronic disease    Hyponatremia    Gangrene (HCC)    PVD (peripheral vascular disease) ([place])    Aspiration into airway    Hearing loss of right ear    Diabetic foot ulcer ([place]) 10/15/2020   Decreased pedal pulses 10/15/2020   Chronic respiratory failure with hypoxia ([place])    Extreme obesity with alveolar hypoventilation ([place])    Palliative care by specialist    Goals of care, counseling/discussion    End-stage renal disease on hemodialysis ([place])    DNR (do not resuscitate) discussion    Adjustment disorder with mixed anxiety and depressed mood 03/31/2017   Dysthymia 03/31/2017   Chronic pain syndrome 03/31/2017   Palliative care encounter    Obesity hypoventilation syndrome ([place]) 03/09/2017   Chronic diastolic heart failure ([place]) 01/07/2017   Depression 09/24/2016   Acute on chronic diastolic heart failure ([place]) 07/04/2016   COPD with chronic bronchitis ([place]) 07/04/2016   Acute on chronic respiratory failure ([place]) 06/26/2016   CKD (chronic kidney disease), stage III ([place]) 06/26/2016   COPD exacerbation ([place]) 03/25/2016   Anxiety 10/02/2015   Asthma 08/30/2015   Type 2 diabetes mellitus with ESRD (end-stage renal disease) ([place]) 03/13/2015   Iron deficiency anemia 02/17/2015   Obesity, Class III, BMI 40-49.9 (morbid obesity) ([place])    Shortness of breath  Essential hypertension    Hypertensive heart disease with congestive heart failure ([place]) 12/18/2014   Diabetes mellitus with ESRD (end-stage renal disease) ([place]) 12/15/2014   Social History   Tobacco Use   Smoking status: Never   Smokeless tobacco: Never  Substance Use Topics   Alcohol use: Not Currently    Comment: occasional   Allergies  Allergen Reactions   Dust Mite Extract Shortness Of Breath and Itching   Mold Extract [Trichophyton]  Shortness Of Breath and Itching   Pollen Extract Shortness Of Breath    Itching, shortness of breath, asthma like symptoms   Sulfa Antibiotics Itching   Feraheme [Ferumoxytol] Itching    Uses benadryl so she can get the medicine   Current Meds  Medication Sig   acetaminophen (TYLENOL) 500 MG tablet Take 1,000 mg by mouth every 6 (six) hours as needed for mild pain, fever or headache.   albuterol (PROVENTIL) (2.5 MG/3ML) 0.083% nebulizer solution Take 3 mLs (2.5 mg total) by nebulization every 6 (six) hours as needed for wheezing or shortness of breath.   albuterol (VENTOLIN HFA) 108 (90 Base) MCG/ACT inhaler Inhale 2 puffs into the lungs every 4 (four) hours as needed for wheezing or shortness of breath.   aspirin EC 81 MG tablet Take 81 mg by mouth daily.    b complex-vitamin c-folic acid (NEPHRO-VITE) 0.8 MG TABS tablet Take 1 tablet by mouth at bedtime.   budesonide (PULMICORT) 0.25 MG/2ML nebulizer solution Take 2 mLs (0.25 mg total) by nebulization 2 (two) times daily.   Cholecalciferol 100 MCG (4000 UT) CAPS Take 1 capsule by mouth once a week.   clindamycin (CLEOCIN T) 1 % lotion Apply 1 application topically daily.   docusate sodium (COLACE) 100 MG capsule Take 100 mg by mouth daily.   ferric citrate (AURYXIA) 1 GM 210 MG(Fe) tablet Take 210 mg by mouth 3 (three) times daily with meals.   formoterol (PERFOROMIST) 20 MCG/2ML nebulizer solution    insulin aspart (NOVOLOG) 100 UNIT/ML injection Inject 0-5 Units into the skin at bedtime.   insulin aspart (NOVOLOG) 100 UNIT/ML injection Inject 0-15 Units into the skin 3 (three) times daily with meals.   insulin degludec (TRESIBA) 100 UNIT/ML FlexTouch Pen Inject 43 Units into the skin daily at 10 pm.   ipratropium-albuterol (DUONEB) 0.5-2.5 (3) MG/3ML SOLN Take 3 mLs by nebulization every 6 (six) hours as needed.   lactulose (CHRONULAC) 10 GM/15ML solution Take 30 mLs (20 g total) by mouth daily as needed for severe constipation.    levocetirizine (XYZAL) 5 MG tablet Take 5 mg by mouth at bedtime.   lidocaine-prilocaine (EMLA) cream Apply topically as needed (For access for dialysis).   LINZESS 290 MCG CAPS capsule Take 290 mcg by mouth every morning.   metoprolol tartrate (LOPRESSOR) 25 MG tablet Take 1 tablet (25 mg total) by mouth 2 (two) times daily.   midodrine (PROAMATINE) 10 MG tablet Take 20 mg by mouth daily as needed.   Omega-3 Fatty Acids ([employer] NATURAL FISH OIL) 1000 MG CAPS 2 mL 2 TIMES DAILY (route: inhalation)   omeprazole (PRILOSEC) 20 MG capsule Take 20 mg by mouth daily.   oxyCODONE-acetaminophen (PERCOCET) 5-325 MG tablet Take 1 tablet by mouth every 4 (four) hours as needed for severe pain.   OXYGEN Inhale 2-4 L into the lungs.   revefenacin (YUPELRI) 175 MCG/3ML nebulizer solution    Immunization History  Administered Date(s) Administered   Hepatitis B, adult 09/26/2018   Influenza,inj,Quad PF,6+ Mos 11/14/2016  Influenza-Unspecified 11/14/2016, 12/27/2017, 10/16/2018, 11/05/2018   PPD Test 04/15/2017, 05/06/2017, 05/15/2017, 04/28/2018, 04/13/2019, 06/01/2020   Pneumococcal Polysaccharide-23 04/29/2017      Objective:   Physical Exam BP 118/70 (BP Location: Left Arm, Patient Position: Sitting, Cuff Size: Normal)    Pulse (!) 111    Temp (!) 97.1 F (36.2 C) (Oral)    Ht 5' 4.5" (1.638 m)    Wt 261 lb (118.4 kg)    LMP 09/06/2016 (Approximate)    SpO2 96%    BMI 44.11 kg/m  GENERAL: Obese woman in no acute respiratory distress.  She presents in a transport chair.  She is wearing oxygen via nasal cannula.  HEAD: Normocephalic, atraumatic.  EYES: Pupils equal, round, reactive to light.  No scleral icterus.  MOUTH: Nose/mouth/throat not examined due to masking requirements for COVID 19. NECK: Supple. No thyromegaly. Trachea midline. No JVD.  No adenopathy. PULMONARY: Symmetrical air entry.  She has scattered rhonchi and wheezes noted.  Moving air fairly well however. CARDIOVASCULAR: S1 and S2.  Regular rate and rhythm.  No rubs or gallops heard.  Grade 2/6 systolic ejection murmur radiating to axilla. GASTROINTESTINAL: Obese abdomen, nondistended. MUSCULOSKELETAL: AV fistula left upper extremity with good thrill.  She has a gangrenous right foot,it is wrapped. This was not exposed for examination. NEUROLOGIC: Awake, alert, no overt focal deficits.  Speech is fluent.  Gait not assessed as patient is on transport chair. SKIN: Intact,warm,dry.  No overt rashes noted. PSYCH: Mood and behavior appropriate  Chest x-ray today, shows decrease in pleural fluid from prior on the right.  Residual pleural scarring with no worsening or new finding:       Assessment & Plan:     ICD-10-CM   1. Pleural effusion on right  J90 DG Chest 2 View   Chest x-ray today shows residual scarring but no new accumulation of fluid Likely due to volume overload/inflammation Culture/cytology negative    2. Severe persistent asthma without complication  C58.85    Continue Perforomist, budesonide and Yupelri Patient provided chart for dosing Rescue nebulizer is albuterol    3. Chronic respiratory failure with hypoxia and hypercapnia (HCC)  J96.11    J96.12    Continue oxygen at current liter flow    4. Daytime sleepiness  R40.0 Nocturnal polysomnography   Recommend home sleep study    5. Gangrene of right foot ([place])  I96    Followed by ID Patient should have amputation Patient refuses amputation     Orders Placed This Encounter  Procedures   DG Chest 2 View    Standing Status:   Future    Number of Occurrences:   1    Standing Expiration Date:   02/28/2022    Order Specific Question:   Reason for Exam (SYMPTOM  OR DIAGNOSIS REQUIRED)    Answer:   PLEURAL EFFUSION    Order Specific Question:   Preferred imaging location?    Answer:   [facility]    Order Specific Question:   Is patient pregnant?    Answer:   No   Nocturnal polysomnography    Standing Status:   Future    Standing  Expiration Date:   02/28/2022    Order Specific Question:   Where should this test be performed:    Answer:   [facility]   Meds ordered this encounter  Medications   albuterol (PROVENTIL) (2.5 MG/3ML) 0.083% nebulizer solution    Sig: Take 3 mLs (2.5 mg total) by nebulization every  6 (six) hours as needed for wheezing or shortness of breath.    Dispense:  75 mL    Refill:  12   We will see the patient in follow-up in 2 months time she is to contact us prior to that time should any new difficulties arise.  Renold Don, MD Advanced Bronchoscopy PCCM [facility]    *This note was dictated using voice recognition software/Dragon.  Despite best efforts to proofread, errors can occur which can change the meaning. Any transcriptional errors that result from this process are unintentional and may not be fully corrected at the time of dictation.

## 2021-02-28 NOTE — Patient Instructions (Addendum)
We are getting a chest x-ray to follow-up on the fluid around your lung.  We are providing you with a chart for your nebulizers.  We will see you in follow-up in 2 months time call sooner if any problems arise.  We are checking with [employer] with regards to your Trilogy machine, this was initiated by your hospital/primary team and we will check on it to see if we need to fill any other paperwork for them.

## 2021-03-01 ENCOUNTER — Telehealth: Payer: Self-pay

## 2021-03-01 DIAGNOSIS — N186 End stage renal disease: Secondary | ICD-10-CM | POA: Diagnosis not present

## 2021-03-01 DIAGNOSIS — Z992 Dependence on renal dialysis: Secondary | ICD-10-CM | POA: Diagnosis not present

## 2021-03-01 NOTE — Telephone Encounter (Signed)
Advised ESR is elevated but better than previous. Was 9.2 now 7.0. AlsoCRP was 4.7 and is now 2.7. I did instruct her not to do anything different and notify us if she has fever or swelling or new symptoms. Patient understood.

## 2021-03-03 DIAGNOSIS — N186 End stage renal disease: Secondary | ICD-10-CM | POA: Diagnosis not present

## 2021-03-03 DIAGNOSIS — Z992 Dependence on renal dialysis: Secondary | ICD-10-CM | POA: Diagnosis not present

## 2021-03-04 DIAGNOSIS — N186 End stage renal disease: Secondary | ICD-10-CM | POA: Diagnosis not present

## 2021-03-04 DIAGNOSIS — Z992 Dependence on renal dialysis: Secondary | ICD-10-CM | POA: Diagnosis not present

## 2021-03-05 DIAGNOSIS — E1151 Type 2 diabetes mellitus with diabetic peripheral angiopathy without gangrene: Secondary | ICD-10-CM | POA: Diagnosis not present

## 2021-03-05 DIAGNOSIS — E11621 Type 2 diabetes mellitus with foot ulcer: Secondary | ICD-10-CM | POA: Diagnosis not present

## 2021-03-05 DIAGNOSIS — I132 Hypertensive heart and chronic kidney disease with heart failure and with stage 5 chronic kidney disease, or end stage renal disease: Secondary | ICD-10-CM | POA: Diagnosis not present

## 2021-03-05 DIAGNOSIS — J449 Chronic obstructive pulmonary disease, unspecified: Secondary | ICD-10-CM | POA: Diagnosis not present

## 2021-03-05 DIAGNOSIS — E1122 Type 2 diabetes mellitus with diabetic chronic kidney disease: Secondary | ICD-10-CM | POA: Diagnosis not present

## 2021-03-05 DIAGNOSIS — I5032 Chronic diastolic (congestive) heart failure: Secondary | ICD-10-CM | POA: Diagnosis not present

## 2021-03-05 DIAGNOSIS — Z9981 Dependence on supplemental oxygen: Secondary | ICD-10-CM | POA: Diagnosis not present

## 2021-03-05 DIAGNOSIS — N186 End stage renal disease: Secondary | ICD-10-CM | POA: Diagnosis not present

## 2021-03-05 DIAGNOSIS — D631 Anemia in chronic kidney disease: Secondary | ICD-10-CM | POA: Diagnosis not present

## 2021-03-05 DIAGNOSIS — Z794 Long term (current) use of insulin: Secondary | ICD-10-CM | POA: Diagnosis not present

## 2021-03-05 DIAGNOSIS — Z992 Dependence on renal dialysis: Secondary | ICD-10-CM | POA: Diagnosis not present

## 2021-03-05 DIAGNOSIS — L97511 Non-pressure chronic ulcer of other part of right foot limited to breakdown of skin: Secondary | ICD-10-CM | POA: Diagnosis not present

## 2021-03-05 DIAGNOSIS — E1152 Type 2 diabetes mellitus with diabetic peripheral angiopathy with gangrene: Secondary | ICD-10-CM | POA: Diagnosis not present

## 2021-03-06 DIAGNOSIS — Z992 Dependence on renal dialysis: Secondary | ICD-10-CM | POA: Diagnosis not present

## 2021-03-06 DIAGNOSIS — N2581 Secondary hyperparathyroidism of renal origin: Secondary | ICD-10-CM | POA: Diagnosis not present

## 2021-03-06 DIAGNOSIS — N186 End stage renal disease: Secondary | ICD-10-CM | POA: Diagnosis not present

## 2021-03-08 DIAGNOSIS — N186 End stage renal disease: Secondary | ICD-10-CM | POA: Diagnosis not present

## 2021-03-08 DIAGNOSIS — Z992 Dependence on renal dialysis: Secondary | ICD-10-CM | POA: Diagnosis not present

## 2021-03-09 DIAGNOSIS — E1122 Type 2 diabetes mellitus with diabetic chronic kidney disease: Secondary | ICD-10-CM | POA: Diagnosis not present

## 2021-03-09 DIAGNOSIS — D631 Anemia in chronic kidney disease: Secondary | ICD-10-CM | POA: Diagnosis not present

## 2021-03-09 DIAGNOSIS — N186 End stage renal disease: Secondary | ICD-10-CM | POA: Diagnosis not present

## 2021-03-09 DIAGNOSIS — I5032 Chronic diastolic (congestive) heart failure: Secondary | ICD-10-CM | POA: Diagnosis not present

## 2021-03-09 DIAGNOSIS — Z9981 Dependence on supplemental oxygen: Secondary | ICD-10-CM | POA: Diagnosis not present

## 2021-03-09 DIAGNOSIS — Z992 Dependence on renal dialysis: Secondary | ICD-10-CM | POA: Diagnosis not present

## 2021-03-09 DIAGNOSIS — L97511 Non-pressure chronic ulcer of other part of right foot limited to breakdown of skin: Secondary | ICD-10-CM | POA: Diagnosis not present

## 2021-03-09 DIAGNOSIS — Z794 Long term (current) use of insulin: Secondary | ICD-10-CM | POA: Diagnosis not present

## 2021-03-09 DIAGNOSIS — E1152 Type 2 diabetes mellitus with diabetic peripheral angiopathy with gangrene: Secondary | ICD-10-CM | POA: Diagnosis not present

## 2021-03-09 DIAGNOSIS — E1151 Type 2 diabetes mellitus with diabetic peripheral angiopathy without gangrene: Secondary | ICD-10-CM | POA: Diagnosis not present

## 2021-03-09 DIAGNOSIS — E11621 Type 2 diabetes mellitus with foot ulcer: Secondary | ICD-10-CM | POA: Diagnosis not present

## 2021-03-09 DIAGNOSIS — I132 Hypertensive heart and chronic kidney disease with heart failure and with stage 5 chronic kidney disease, or end stage renal disease: Secondary | ICD-10-CM | POA: Diagnosis not present

## 2021-03-09 LAB — ACID FAST CULTURE WITH REFLEXED SENSITIVITIES (MYCOBACTERIA): Acid Fast Culture: NEGATIVE

## 2021-03-10 DIAGNOSIS — Z992 Dependence on renal dialysis: Secondary | ICD-10-CM | POA: Diagnosis not present

## 2021-03-10 DIAGNOSIS — N186 End stage renal disease: Secondary | ICD-10-CM | POA: Diagnosis not present

## 2021-03-12 DIAGNOSIS — I132 Hypertensive heart and chronic kidney disease with heart failure and with stage 5 chronic kidney disease, or end stage renal disease: Secondary | ICD-10-CM | POA: Diagnosis not present

## 2021-03-12 DIAGNOSIS — E1122 Type 2 diabetes mellitus with diabetic chronic kidney disease: Secondary | ICD-10-CM | POA: Diagnosis not present

## 2021-03-12 DIAGNOSIS — L97511 Non-pressure chronic ulcer of other part of right foot limited to breakdown of skin: Secondary | ICD-10-CM | POA: Diagnosis not present

## 2021-03-12 DIAGNOSIS — E1151 Type 2 diabetes mellitus with diabetic peripheral angiopathy without gangrene: Secondary | ICD-10-CM | POA: Diagnosis not present

## 2021-03-12 DIAGNOSIS — N186 End stage renal disease: Secondary | ICD-10-CM | POA: Diagnosis not present

## 2021-03-12 DIAGNOSIS — E11621 Type 2 diabetes mellitus with foot ulcer: Secondary | ICD-10-CM | POA: Diagnosis not present

## 2021-03-12 DIAGNOSIS — I5032 Chronic diastolic (congestive) heart failure: Secondary | ICD-10-CM | POA: Diagnosis not present

## 2021-03-12 DIAGNOSIS — E1152 Type 2 diabetes mellitus with diabetic peripheral angiopathy with gangrene: Secondary | ICD-10-CM | POA: Diagnosis not present

## 2021-03-12 DIAGNOSIS — D631 Anemia in chronic kidney disease: Secondary | ICD-10-CM | POA: Diagnosis not present

## 2021-03-12 DIAGNOSIS — Z992 Dependence on renal dialysis: Secondary | ICD-10-CM | POA: Diagnosis not present

## 2021-03-12 DIAGNOSIS — Z9981 Dependence on supplemental oxygen: Secondary | ICD-10-CM | POA: Diagnosis not present

## 2021-03-12 DIAGNOSIS — Z794 Long term (current) use of insulin: Secondary | ICD-10-CM | POA: Diagnosis not present

## 2021-03-13 DIAGNOSIS — N186 End stage renal disease: Secondary | ICD-10-CM | POA: Diagnosis not present

## 2021-03-13 DIAGNOSIS — Z992 Dependence on renal dialysis: Secondary | ICD-10-CM | POA: Diagnosis not present

## 2021-03-14 DIAGNOSIS — Z9981 Dependence on supplemental oxygen: Secondary | ICD-10-CM | POA: Diagnosis not present

## 2021-03-14 DIAGNOSIS — E1152 Type 2 diabetes mellitus with diabetic peripheral angiopathy with gangrene: Secondary | ICD-10-CM | POA: Diagnosis not present

## 2021-03-14 DIAGNOSIS — J9610 Chronic respiratory failure, unspecified whether with hypoxia or hypercapnia: Secondary | ICD-10-CM | POA: Diagnosis not present

## 2021-03-14 DIAGNOSIS — I5032 Chronic diastolic (congestive) heart failure: Secondary | ICD-10-CM | POA: Diagnosis not present

## 2021-03-14 DIAGNOSIS — E11621 Type 2 diabetes mellitus with foot ulcer: Secondary | ICD-10-CM | POA: Diagnosis not present

## 2021-03-14 DIAGNOSIS — L97511 Non-pressure chronic ulcer of other part of right foot limited to breakdown of skin: Secondary | ICD-10-CM | POA: Diagnosis not present

## 2021-03-14 DIAGNOSIS — J449 Chronic obstructive pulmonary disease, unspecified: Secondary | ICD-10-CM | POA: Diagnosis not present

## 2021-03-14 DIAGNOSIS — I132 Hypertensive heart and chronic kidney disease with heart failure and with stage 5 chronic kidney disease, or end stage renal disease: Secondary | ICD-10-CM | POA: Diagnosis not present

## 2021-03-14 DIAGNOSIS — Z992 Dependence on renal dialysis: Secondary | ICD-10-CM | POA: Diagnosis not present

## 2021-03-14 DIAGNOSIS — D631 Anemia in chronic kidney disease: Secondary | ICD-10-CM | POA: Diagnosis not present

## 2021-03-14 DIAGNOSIS — E1122 Type 2 diabetes mellitus with diabetic chronic kidney disease: Secondary | ICD-10-CM | POA: Diagnosis not present

## 2021-03-14 DIAGNOSIS — E1151 Type 2 diabetes mellitus with diabetic peripheral angiopathy without gangrene: Secondary | ICD-10-CM | POA: Diagnosis not present

## 2021-03-14 DIAGNOSIS — Z794 Long term (current) use of insulin: Secondary | ICD-10-CM | POA: Diagnosis not present

## 2021-03-14 DIAGNOSIS — N186 End stage renal disease: Secondary | ICD-10-CM | POA: Diagnosis not present

## 2021-03-15 DIAGNOSIS — Z992 Dependence on renal dialysis: Secondary | ICD-10-CM | POA: Diagnosis not present

## 2021-03-15 DIAGNOSIS — N186 End stage renal disease: Secondary | ICD-10-CM | POA: Diagnosis not present

## 2021-03-16 DIAGNOSIS — Z794 Long term (current) use of insulin: Secondary | ICD-10-CM | POA: Diagnosis not present

## 2021-03-16 DIAGNOSIS — Z9981 Dependence on supplemental oxygen: Secondary | ICD-10-CM | POA: Diagnosis not present

## 2021-03-16 DIAGNOSIS — E1151 Type 2 diabetes mellitus with diabetic peripheral angiopathy without gangrene: Secondary | ICD-10-CM | POA: Diagnosis not present

## 2021-03-16 DIAGNOSIS — E1122 Type 2 diabetes mellitus with diabetic chronic kidney disease: Secondary | ICD-10-CM | POA: Diagnosis not present

## 2021-03-16 DIAGNOSIS — N186 End stage renal disease: Secondary | ICD-10-CM | POA: Diagnosis not present

## 2021-03-16 DIAGNOSIS — L97511 Non-pressure chronic ulcer of other part of right foot limited to breakdown of skin: Secondary | ICD-10-CM | POA: Diagnosis not present

## 2021-03-16 DIAGNOSIS — E1152 Type 2 diabetes mellitus with diabetic peripheral angiopathy with gangrene: Secondary | ICD-10-CM | POA: Diagnosis not present

## 2021-03-16 DIAGNOSIS — I5032 Chronic diastolic (congestive) heart failure: Secondary | ICD-10-CM | POA: Diagnosis not present

## 2021-03-16 DIAGNOSIS — D631 Anemia in chronic kidney disease: Secondary | ICD-10-CM | POA: Diagnosis not present

## 2021-03-16 DIAGNOSIS — Z992 Dependence on renal dialysis: Secondary | ICD-10-CM | POA: Diagnosis not present

## 2021-03-16 DIAGNOSIS — I132 Hypertensive heart and chronic kidney disease with heart failure and with stage 5 chronic kidney disease, or end stage renal disease: Secondary | ICD-10-CM | POA: Diagnosis not present

## 2021-03-16 DIAGNOSIS — E11621 Type 2 diabetes mellitus with foot ulcer: Secondary | ICD-10-CM | POA: Diagnosis not present

## 2021-03-17 DIAGNOSIS — N186 End stage renal disease: Secondary | ICD-10-CM | POA: Diagnosis not present

## 2021-03-17 DIAGNOSIS — Z992 Dependence on renal dialysis: Secondary | ICD-10-CM | POA: Diagnosis not present

## 2021-03-19 DIAGNOSIS — E1165 Type 2 diabetes mellitus with hyperglycemia: Secondary | ICD-10-CM | POA: Diagnosis not present

## 2021-03-19 DIAGNOSIS — E1152 Type 2 diabetes mellitus with diabetic peripheral angiopathy with gangrene: Secondary | ICD-10-CM | POA: Diagnosis not present

## 2021-03-19 DIAGNOSIS — J449 Chronic obstructive pulmonary disease, unspecified: Secondary | ICD-10-CM | POA: Diagnosis not present

## 2021-03-19 DIAGNOSIS — Z794 Long term (current) use of insulin: Secondary | ICD-10-CM | POA: Diagnosis not present

## 2021-03-19 DIAGNOSIS — N186 End stage renal disease: Secondary | ICD-10-CM | POA: Diagnosis not present

## 2021-03-19 DIAGNOSIS — I132 Hypertensive heart and chronic kidney disease with heart failure and with stage 5 chronic kidney disease, or end stage renal disease: Secondary | ICD-10-CM | POA: Diagnosis not present

## 2021-03-19 DIAGNOSIS — D631 Anemia in chronic kidney disease: Secondary | ICD-10-CM | POA: Diagnosis not present

## 2021-03-19 DIAGNOSIS — E1151 Type 2 diabetes mellitus with diabetic peripheral angiopathy without gangrene: Secondary | ICD-10-CM | POA: Diagnosis not present

## 2021-03-19 DIAGNOSIS — Z9981 Dependence on supplemental oxygen: Secondary | ICD-10-CM | POA: Diagnosis not present

## 2021-03-19 DIAGNOSIS — E11621 Type 2 diabetes mellitus with foot ulcer: Secondary | ICD-10-CM | POA: Diagnosis not present

## 2021-03-19 DIAGNOSIS — E1122 Type 2 diabetes mellitus with diabetic chronic kidney disease: Secondary | ICD-10-CM | POA: Diagnosis not present

## 2021-03-19 DIAGNOSIS — Z992 Dependence on renal dialysis: Secondary | ICD-10-CM | POA: Diagnosis not present

## 2021-03-19 DIAGNOSIS — F32A Depression, unspecified: Secondary | ICD-10-CM | POA: Diagnosis not present

## 2021-03-19 DIAGNOSIS — L97511 Non-pressure chronic ulcer of other part of right foot limited to breakdown of skin: Secondary | ICD-10-CM | POA: Diagnosis not present

## 2021-03-19 DIAGNOSIS — I5032 Chronic diastolic (congestive) heart failure: Secondary | ICD-10-CM | POA: Diagnosis not present

## 2021-03-20 DIAGNOSIS — N186 End stage renal disease: Secondary | ICD-10-CM | POA: Diagnosis not present

## 2021-03-20 DIAGNOSIS — J449 Chronic obstructive pulmonary disease, unspecified: Secondary | ICD-10-CM | POA: Diagnosis not present

## 2021-03-20 DIAGNOSIS — J9611 Chronic respiratory failure with hypoxia: Secondary | ICD-10-CM | POA: Diagnosis not present

## 2021-03-20 DIAGNOSIS — Z992 Dependence on renal dialysis: Secondary | ICD-10-CM | POA: Diagnosis not present

## 2021-03-20 DIAGNOSIS — D509 Iron deficiency anemia, unspecified: Secondary | ICD-10-CM | POA: Diagnosis not present

## 2021-03-20 DIAGNOSIS — I5032 Chronic diastolic (congestive) heart failure: Secondary | ICD-10-CM | POA: Diagnosis not present

## 2021-03-20 DIAGNOSIS — E119 Type 2 diabetes mellitus without complications: Secondary | ICD-10-CM | POA: Diagnosis not present

## 2021-03-20 DIAGNOSIS — Z794 Long term (current) use of insulin: Secondary | ICD-10-CM | POA: Diagnosis not present

## 2021-03-22 DIAGNOSIS — Z992 Dependence on renal dialysis: Secondary | ICD-10-CM | POA: Diagnosis not present

## 2021-03-22 DIAGNOSIS — N186 End stage renal disease: Secondary | ICD-10-CM | POA: Diagnosis not present

## 2021-03-23 DIAGNOSIS — Z9981 Dependence on supplemental oxygen: Secondary | ICD-10-CM | POA: Diagnosis not present

## 2021-03-23 DIAGNOSIS — N186 End stage renal disease: Secondary | ICD-10-CM | POA: Diagnosis not present

## 2021-03-23 DIAGNOSIS — I132 Hypertensive heart and chronic kidney disease with heart failure and with stage 5 chronic kidney disease, or end stage renal disease: Secondary | ICD-10-CM | POA: Diagnosis not present

## 2021-03-23 DIAGNOSIS — Z992 Dependence on renal dialysis: Secondary | ICD-10-CM | POA: Diagnosis not present

## 2021-03-23 DIAGNOSIS — E1122 Type 2 diabetes mellitus with diabetic chronic kidney disease: Secondary | ICD-10-CM | POA: Diagnosis not present

## 2021-03-23 DIAGNOSIS — E1151 Type 2 diabetes mellitus with diabetic peripheral angiopathy without gangrene: Secondary | ICD-10-CM | POA: Diagnosis not present

## 2021-03-23 DIAGNOSIS — Z794 Long term (current) use of insulin: Secondary | ICD-10-CM | POA: Diagnosis not present

## 2021-03-23 DIAGNOSIS — E11621 Type 2 diabetes mellitus with foot ulcer: Secondary | ICD-10-CM | POA: Diagnosis not present

## 2021-03-23 DIAGNOSIS — E1152 Type 2 diabetes mellitus with diabetic peripheral angiopathy with gangrene: Secondary | ICD-10-CM | POA: Diagnosis not present

## 2021-03-23 DIAGNOSIS — L97511 Non-pressure chronic ulcer of other part of right foot limited to breakdown of skin: Secondary | ICD-10-CM | POA: Diagnosis not present

## 2021-03-23 DIAGNOSIS — I5032 Chronic diastolic (congestive) heart failure: Secondary | ICD-10-CM | POA: Diagnosis not present

## 2021-03-23 DIAGNOSIS — D631 Anemia in chronic kidney disease: Secondary | ICD-10-CM | POA: Diagnosis not present

## 2021-03-24 DIAGNOSIS — Z992 Dependence on renal dialysis: Secondary | ICD-10-CM | POA: Diagnosis not present

## 2021-03-24 DIAGNOSIS — N186 End stage renal disease: Secondary | ICD-10-CM | POA: Diagnosis not present

## 2021-03-26 DIAGNOSIS — D631 Anemia in chronic kidney disease: Secondary | ICD-10-CM | POA: Diagnosis not present

## 2021-03-26 DIAGNOSIS — Z794 Long term (current) use of insulin: Secondary | ICD-10-CM | POA: Diagnosis not present

## 2021-03-26 DIAGNOSIS — E1122 Type 2 diabetes mellitus with diabetic chronic kidney disease: Secondary | ICD-10-CM | POA: Diagnosis not present

## 2021-03-26 DIAGNOSIS — E11621 Type 2 diabetes mellitus with foot ulcer: Secondary | ICD-10-CM | POA: Diagnosis not present

## 2021-03-26 DIAGNOSIS — E1151 Type 2 diabetes mellitus with diabetic peripheral angiopathy without gangrene: Secondary | ICD-10-CM | POA: Diagnosis not present

## 2021-03-26 DIAGNOSIS — Z992 Dependence on renal dialysis: Secondary | ICD-10-CM | POA: Diagnosis not present

## 2021-03-26 DIAGNOSIS — L97511 Non-pressure chronic ulcer of other part of right foot limited to breakdown of skin: Secondary | ICD-10-CM | POA: Diagnosis not present

## 2021-03-26 DIAGNOSIS — E1152 Type 2 diabetes mellitus with diabetic peripheral angiopathy with gangrene: Secondary | ICD-10-CM | POA: Diagnosis not present

## 2021-03-26 DIAGNOSIS — I5032 Chronic diastolic (congestive) heart failure: Secondary | ICD-10-CM | POA: Diagnosis not present

## 2021-03-26 DIAGNOSIS — I132 Hypertensive heart and chronic kidney disease with heart failure and with stage 5 chronic kidney disease, or end stage renal disease: Secondary | ICD-10-CM | POA: Diagnosis not present

## 2021-03-26 DIAGNOSIS — Z9981 Dependence on supplemental oxygen: Secondary | ICD-10-CM | POA: Diagnosis not present

## 2021-03-26 DIAGNOSIS — N186 End stage renal disease: Secondary | ICD-10-CM | POA: Diagnosis not present

## 2021-03-27 DIAGNOSIS — N186 End stage renal disease: Secondary | ICD-10-CM | POA: Diagnosis not present

## 2021-03-27 DIAGNOSIS — Z992 Dependence on renal dialysis: Secondary | ICD-10-CM | POA: Diagnosis not present

## 2021-03-28 DIAGNOSIS — J449 Chronic obstructive pulmonary disease, unspecified: Secondary | ICD-10-CM | POA: Diagnosis not present

## 2021-03-28 DIAGNOSIS — N186 End stage renal disease: Secondary | ICD-10-CM | POA: Diagnosis not present

## 2021-03-28 DIAGNOSIS — Z992 Dependence on renal dialysis: Secondary | ICD-10-CM | POA: Diagnosis not present

## 2021-03-28 DIAGNOSIS — Z794 Long term (current) use of insulin: Secondary | ICD-10-CM | POA: Diagnosis not present

## 2021-03-28 DIAGNOSIS — L97509 Non-pressure chronic ulcer of other part of unspecified foot with unspecified severity: Secondary | ICD-10-CM | POA: Diagnosis not present

## 2021-03-28 DIAGNOSIS — J9610 Chronic respiratory failure, unspecified whether with hypoxia or hypercapnia: Secondary | ICD-10-CM | POA: Diagnosis not present

## 2021-03-28 DIAGNOSIS — J45909 Unspecified asthma, uncomplicated: Secondary | ICD-10-CM | POA: Diagnosis not present

## 2021-03-28 DIAGNOSIS — E1122 Type 2 diabetes mellitus with diabetic chronic kidney disease: Secondary | ICD-10-CM | POA: Diagnosis not present

## 2021-03-28 DIAGNOSIS — D631 Anemia in chronic kidney disease: Secondary | ICD-10-CM | POA: Diagnosis not present

## 2021-03-28 DIAGNOSIS — I5030 Unspecified diastolic (congestive) heart failure: Secondary | ICD-10-CM | POA: Diagnosis not present

## 2021-03-28 DIAGNOSIS — E11621 Type 2 diabetes mellitus with foot ulcer: Secondary | ICD-10-CM | POA: Diagnosis not present

## 2021-03-28 DIAGNOSIS — I96 Gangrene, not elsewhere classified: Secondary | ICD-10-CM | POA: Diagnosis not present

## 2021-03-28 DIAGNOSIS — I5040 Unspecified combined systolic (congestive) and diastolic (congestive) heart failure: Secondary | ICD-10-CM | POA: Diagnosis not present

## 2021-03-28 DIAGNOSIS — E1151 Type 2 diabetes mellitus with diabetic peripheral angiopathy without gangrene: Secondary | ICD-10-CM | POA: Diagnosis not present

## 2021-03-28 DIAGNOSIS — Z9981 Dependence on supplemental oxygen: Secondary | ICD-10-CM | POA: Diagnosis not present

## 2021-03-28 DIAGNOSIS — I5032 Chronic diastolic (congestive) heart failure: Secondary | ICD-10-CM | POA: Diagnosis not present

## 2021-03-28 DIAGNOSIS — I132 Hypertensive heart and chronic kidney disease with heart failure and with stage 5 chronic kidney disease, or end stage renal disease: Secondary | ICD-10-CM | POA: Diagnosis not present

## 2021-03-28 DIAGNOSIS — L97511 Non-pressure chronic ulcer of other part of right foot limited to breakdown of skin: Secondary | ICD-10-CM | POA: Diagnosis not present

## 2021-03-28 DIAGNOSIS — E1152 Type 2 diabetes mellitus with diabetic peripheral angiopathy with gangrene: Secondary | ICD-10-CM | POA: Diagnosis not present

## 2021-03-29 DIAGNOSIS — Z992 Dependence on renal dialysis: Secondary | ICD-10-CM | POA: Diagnosis not present

## 2021-03-29 DIAGNOSIS — N186 End stage renal disease: Secondary | ICD-10-CM | POA: Diagnosis not present

## 2021-03-30 DIAGNOSIS — D631 Anemia in chronic kidney disease: Secondary | ICD-10-CM | POA: Diagnosis not present

## 2021-03-30 DIAGNOSIS — Z9981 Dependence on supplemental oxygen: Secondary | ICD-10-CM | POA: Diagnosis not present

## 2021-03-30 DIAGNOSIS — Z794 Long term (current) use of insulin: Secondary | ICD-10-CM | POA: Diagnosis not present

## 2021-03-30 DIAGNOSIS — E1151 Type 2 diabetes mellitus with diabetic peripheral angiopathy without gangrene: Secondary | ICD-10-CM | POA: Diagnosis not present

## 2021-03-30 DIAGNOSIS — E11621 Type 2 diabetes mellitus with foot ulcer: Secondary | ICD-10-CM | POA: Diagnosis not present

## 2021-03-30 DIAGNOSIS — L97511 Non-pressure chronic ulcer of other part of right foot limited to breakdown of skin: Secondary | ICD-10-CM | POA: Diagnosis not present

## 2021-03-30 DIAGNOSIS — I5032 Chronic diastolic (congestive) heart failure: Secondary | ICD-10-CM | POA: Diagnosis not present

## 2021-03-30 DIAGNOSIS — N186 End stage renal disease: Secondary | ICD-10-CM | POA: Diagnosis not present

## 2021-03-30 DIAGNOSIS — E1122 Type 2 diabetes mellitus with diabetic chronic kidney disease: Secondary | ICD-10-CM | POA: Diagnosis not present

## 2021-03-30 DIAGNOSIS — E1152 Type 2 diabetes mellitus with diabetic peripheral angiopathy with gangrene: Secondary | ICD-10-CM | POA: Diagnosis not present

## 2021-03-30 DIAGNOSIS — I132 Hypertensive heart and chronic kidney disease with heart failure and with stage 5 chronic kidney disease, or end stage renal disease: Secondary | ICD-10-CM | POA: Diagnosis not present

## 2021-03-30 DIAGNOSIS — Z992 Dependence on renal dialysis: Secondary | ICD-10-CM | POA: Diagnosis not present

## 2021-03-31 DIAGNOSIS — N186 End stage renal disease: Secondary | ICD-10-CM | POA: Diagnosis not present

## 2021-03-31 DIAGNOSIS — Z992 Dependence on renal dialysis: Secondary | ICD-10-CM | POA: Diagnosis not present

## 2021-04-03 ENCOUNTER — Encounter: Payer: Self-pay | Admitting: Oncology

## 2021-04-03 DIAGNOSIS — Z992 Dependence on renal dialysis: Secondary | ICD-10-CM | POA: Diagnosis not present

## 2021-04-03 DIAGNOSIS — N186 End stage renal disease: Secondary | ICD-10-CM | POA: Diagnosis not present

## 2021-04-03 DIAGNOSIS — N2581 Secondary hyperparathyroidism of renal origin: Secondary | ICD-10-CM | POA: Diagnosis not present

## 2021-04-04 DIAGNOSIS — Z794 Long term (current) use of insulin: Secondary | ICD-10-CM | POA: Diagnosis not present

## 2021-04-04 DIAGNOSIS — E1151 Type 2 diabetes mellitus with diabetic peripheral angiopathy without gangrene: Secondary | ICD-10-CM | POA: Diagnosis not present

## 2021-04-04 DIAGNOSIS — D631 Anemia in chronic kidney disease: Secondary | ICD-10-CM | POA: Diagnosis not present

## 2021-04-04 DIAGNOSIS — E1122 Type 2 diabetes mellitus with diabetic chronic kidney disease: Secondary | ICD-10-CM | POA: Diagnosis not present

## 2021-04-04 DIAGNOSIS — Z992 Dependence on renal dialysis: Secondary | ICD-10-CM | POA: Diagnosis not present

## 2021-04-04 DIAGNOSIS — E11621 Type 2 diabetes mellitus with foot ulcer: Secondary | ICD-10-CM | POA: Diagnosis not present

## 2021-04-04 DIAGNOSIS — N186 End stage renal disease: Secondary | ICD-10-CM | POA: Diagnosis not present

## 2021-04-04 DIAGNOSIS — I5032 Chronic diastolic (congestive) heart failure: Secondary | ICD-10-CM | POA: Diagnosis not present

## 2021-04-04 DIAGNOSIS — Z9981 Dependence on supplemental oxygen: Secondary | ICD-10-CM | POA: Diagnosis not present

## 2021-04-04 DIAGNOSIS — L97511 Non-pressure chronic ulcer of other part of right foot limited to breakdown of skin: Secondary | ICD-10-CM | POA: Diagnosis not present

## 2021-04-04 DIAGNOSIS — I132 Hypertensive heart and chronic kidney disease with heart failure and with stage 5 chronic kidney disease, or end stage renal disease: Secondary | ICD-10-CM | POA: Diagnosis not present

## 2021-04-04 DIAGNOSIS — E1152 Type 2 diabetes mellitus with diabetic peripheral angiopathy with gangrene: Secondary | ICD-10-CM | POA: Diagnosis not present

## 2021-04-05 DIAGNOSIS — Z992 Dependence on renal dialysis: Secondary | ICD-10-CM | POA: Diagnosis not present

## 2021-04-05 DIAGNOSIS — N186 End stage renal disease: Secondary | ICD-10-CM | POA: Diagnosis not present

## 2021-04-06 DIAGNOSIS — E1122 Type 2 diabetes mellitus with diabetic chronic kidney disease: Secondary | ICD-10-CM | POA: Diagnosis not present

## 2021-04-06 DIAGNOSIS — E1151 Type 2 diabetes mellitus with diabetic peripheral angiopathy without gangrene: Secondary | ICD-10-CM | POA: Diagnosis not present

## 2021-04-06 DIAGNOSIS — E11621 Type 2 diabetes mellitus with foot ulcer: Secondary | ICD-10-CM | POA: Diagnosis not present

## 2021-04-06 DIAGNOSIS — I5032 Chronic diastolic (congestive) heart failure: Secondary | ICD-10-CM | POA: Diagnosis not present

## 2021-04-06 DIAGNOSIS — Z9981 Dependence on supplemental oxygen: Secondary | ICD-10-CM | POA: Diagnosis not present

## 2021-04-06 DIAGNOSIS — Z992 Dependence on renal dialysis: Secondary | ICD-10-CM | POA: Diagnosis not present

## 2021-04-06 DIAGNOSIS — L97511 Non-pressure chronic ulcer of other part of right foot limited to breakdown of skin: Secondary | ICD-10-CM | POA: Diagnosis not present

## 2021-04-06 DIAGNOSIS — E1152 Type 2 diabetes mellitus with diabetic peripheral angiopathy with gangrene: Secondary | ICD-10-CM | POA: Diagnosis not present

## 2021-04-06 DIAGNOSIS — Z794 Long term (current) use of insulin: Secondary | ICD-10-CM | POA: Diagnosis not present

## 2021-04-06 DIAGNOSIS — I132 Hypertensive heart and chronic kidney disease with heart failure and with stage 5 chronic kidney disease, or end stage renal disease: Secondary | ICD-10-CM | POA: Diagnosis not present

## 2021-04-06 DIAGNOSIS — N186 End stage renal disease: Secondary | ICD-10-CM | POA: Diagnosis not present

## 2021-04-06 DIAGNOSIS — D631 Anemia in chronic kidney disease: Secondary | ICD-10-CM | POA: Diagnosis not present

## 2021-04-07 DIAGNOSIS — N186 End stage renal disease: Secondary | ICD-10-CM | POA: Diagnosis not present

## 2021-04-07 DIAGNOSIS — Z992 Dependence on renal dialysis: Secondary | ICD-10-CM | POA: Diagnosis not present

## 2021-04-09 ENCOUNTER — Ambulatory Visit: Payer: Medicare Other | Attending: Pulmonary Disease

## 2021-04-09 DIAGNOSIS — R400 Somnolence: Secondary | ICD-10-CM | POA: Diagnosis not present

## 2021-04-09 DIAGNOSIS — I509 Heart failure, unspecified: Secondary | ICD-10-CM | POA: Insufficient documentation

## 2021-04-09 DIAGNOSIS — E1152 Type 2 diabetes mellitus with diabetic peripheral angiopathy with gangrene: Secondary | ICD-10-CM | POA: Diagnosis not present

## 2021-04-09 DIAGNOSIS — I5032 Chronic diastolic (congestive) heart failure: Secondary | ICD-10-CM | POA: Diagnosis not present

## 2021-04-09 DIAGNOSIS — Z992 Dependence on renal dialysis: Secondary | ICD-10-CM | POA: Diagnosis not present

## 2021-04-09 DIAGNOSIS — N186 End stage renal disease: Secondary | ICD-10-CM | POA: Diagnosis not present

## 2021-04-09 DIAGNOSIS — M3501 Sicca syndrome with keratoconjunctivitis: Secondary | ICD-10-CM | POA: Diagnosis not present

## 2021-04-09 DIAGNOSIS — L97511 Non-pressure chronic ulcer of other part of right foot limited to breakdown of skin: Secondary | ICD-10-CM | POA: Diagnosis not present

## 2021-04-09 DIAGNOSIS — E1122 Type 2 diabetes mellitus with diabetic chronic kidney disease: Secondary | ICD-10-CM | POA: Diagnosis not present

## 2021-04-09 DIAGNOSIS — G4733 Obstructive sleep apnea (adult) (pediatric): Secondary | ICD-10-CM | POA: Insufficient documentation

## 2021-04-09 DIAGNOSIS — G4736 Sleep related hypoventilation in conditions classified elsewhere: Secondary | ICD-10-CM | POA: Diagnosis not present

## 2021-04-09 DIAGNOSIS — Z6841 Body Mass Index (BMI) 40.0 and over, adult: Secondary | ICD-10-CM | POA: Insufficient documentation

## 2021-04-09 DIAGNOSIS — E11621 Type 2 diabetes mellitus with foot ulcer: Secondary | ICD-10-CM | POA: Diagnosis not present

## 2021-04-09 DIAGNOSIS — E1151 Type 2 diabetes mellitus with diabetic peripheral angiopathy without gangrene: Secondary | ICD-10-CM | POA: Diagnosis not present

## 2021-04-09 DIAGNOSIS — D631 Anemia in chronic kidney disease: Secondary | ICD-10-CM | POA: Diagnosis not present

## 2021-04-09 DIAGNOSIS — I132 Hypertensive heart and chronic kidney disease with heart failure and with stage 5 chronic kidney disease, or end stage renal disease: Secondary | ICD-10-CM | POA: Diagnosis not present

## 2021-04-09 DIAGNOSIS — H3343 Traction detachment of retina, bilateral: Secondary | ICD-10-CM | POA: Diagnosis not present

## 2021-04-09 DIAGNOSIS — Z9981 Dependence on supplemental oxygen: Secondary | ICD-10-CM | POA: Diagnosis not present

## 2021-04-09 DIAGNOSIS — Z794 Long term (current) use of insulin: Secondary | ICD-10-CM | POA: Diagnosis not present

## 2021-04-10 DIAGNOSIS — Z992 Dependence on renal dialysis: Secondary | ICD-10-CM | POA: Diagnosis not present

## 2021-04-10 DIAGNOSIS — N186 End stage renal disease: Secondary | ICD-10-CM | POA: Diagnosis not present

## 2021-04-10 DIAGNOSIS — N2581 Secondary hyperparathyroidism of renal origin: Secondary | ICD-10-CM | POA: Diagnosis not present

## 2021-04-11 ENCOUNTER — Encounter: Payer: Self-pay | Admitting: Oncology

## 2021-04-11 ENCOUNTER — Telehealth (HOSPITAL_BASED_OUTPATIENT_CLINIC_OR_DEPARTMENT_OTHER): Payer: Medicare Other | Admitting: Pulmonary Disease

## 2021-04-11 DIAGNOSIS — G4733 Obstructive sleep apnea (adult) (pediatric): Secondary | ICD-10-CM

## 2021-04-11 DIAGNOSIS — Z9981 Dependence on supplemental oxygen: Secondary | ICD-10-CM | POA: Diagnosis not present

## 2021-04-11 DIAGNOSIS — D631 Anemia in chronic kidney disease: Secondary | ICD-10-CM | POA: Diagnosis not present

## 2021-04-11 DIAGNOSIS — E11621 Type 2 diabetes mellitus with foot ulcer: Secondary | ICD-10-CM | POA: Diagnosis not present

## 2021-04-11 DIAGNOSIS — I5032 Chronic diastolic (congestive) heart failure: Secondary | ICD-10-CM | POA: Diagnosis not present

## 2021-04-11 DIAGNOSIS — N186 End stage renal disease: Secondary | ICD-10-CM | POA: Diagnosis not present

## 2021-04-11 DIAGNOSIS — Z992 Dependence on renal dialysis: Secondary | ICD-10-CM | POA: Diagnosis not present

## 2021-04-11 DIAGNOSIS — L97511 Non-pressure chronic ulcer of other part of right foot limited to breakdown of skin: Secondary | ICD-10-CM | POA: Diagnosis not present

## 2021-04-11 DIAGNOSIS — E1151 Type 2 diabetes mellitus with diabetic peripheral angiopathy without gangrene: Secondary | ICD-10-CM | POA: Diagnosis not present

## 2021-04-11 DIAGNOSIS — I132 Hypertensive heart and chronic kidney disease with heart failure and with stage 5 chronic kidney disease, or end stage renal disease: Secondary | ICD-10-CM | POA: Diagnosis not present

## 2021-04-11 DIAGNOSIS — E1152 Type 2 diabetes mellitus with diabetic peripheral angiopathy with gangrene: Secondary | ICD-10-CM | POA: Diagnosis not present

## 2021-04-11 DIAGNOSIS — E1122 Type 2 diabetes mellitus with diabetic chronic kidney disease: Secondary | ICD-10-CM | POA: Diagnosis not present

## 2021-04-11 DIAGNOSIS — Z794 Long term (current) use of insulin: Secondary | ICD-10-CM | POA: Diagnosis not present

## 2021-04-11 DIAGNOSIS — E662 Morbid (severe) obesity with alveolar hypoventilation: Secondary | ICD-10-CM

## 2021-04-11 NOTE — Telephone Encounter (Signed)
Patient has  severe OSA.  RDI of 42/h, she will need a formal sleep titration study

## 2021-04-11 NOTE — Telephone Encounter (Signed)
Severe OSA, RDI 42/h , predominant hypopneas & RERAs 1-2 L O2 was used for hypoxia Suggest formal titration study  Seems like she is already on NIV

## 2021-04-11 NOTE — Telephone Encounter (Signed)
Lets see if we can arrange for auto BiPAP or trilogy vent.  She clearly has not only severe sleep apnea but obesity with obesity hypoventilation.  She would need 1 to 2 L of oxygen bled in as well.

## 2021-04-11 NOTE — Telephone Encounter (Signed)
Spoke to patient.  She is aware that titration studies can not be performed at home.  She is questioning if she can use NIV without doing cpap titration. She does not currently using NIV.  Dr. Patsey Berthold, please advise. Thanks

## 2021-04-11 NOTE — Telephone Encounter (Signed)
Spoke to patient and relayed below results.  She would like to think about titration study. She will call back with update.

## 2021-04-11 NOTE — Telephone Encounter (Signed)
Spoke to [place] with [employer]. Patient did not qualify for NIV 58mo ago. Insurance denied peer to peer.  He stated that patient could qualify for bipap if ONO with oxygen is performed and still shows desats.   Dr. Patsey Berthold, please advise. Thanks

## 2021-04-11 NOTE — Telephone Encounter (Signed)
Patient is returning phone call. Patient phone number is [phone].

## 2021-04-12 ENCOUNTER — Other Ambulatory Visit: Payer: Self-pay

## 2021-04-12 DIAGNOSIS — N186 End stage renal disease: Secondary | ICD-10-CM | POA: Diagnosis not present

## 2021-04-12 DIAGNOSIS — G4733 Obstructive sleep apnea (adult) (pediatric): Secondary | ICD-10-CM

## 2021-04-12 DIAGNOSIS — E662 Morbid (severe) obesity with alveolar hypoventilation: Secondary | ICD-10-CM

## 2021-04-12 DIAGNOSIS — Z992 Dependence on renal dialysis: Secondary | ICD-10-CM | POA: Diagnosis not present

## 2021-04-12 NOTE — Telephone Encounter (Signed)
Lets schedule ONOX on O2

## 2021-04-12 NOTE — Telephone Encounter (Signed)
ONO ordered. ?Patient is aware and voiced her understanding.  ?Nothing further needed.  ? ?

## 2021-04-13 DIAGNOSIS — Z992 Dependence on renal dialysis: Secondary | ICD-10-CM | POA: Diagnosis not present

## 2021-04-13 DIAGNOSIS — Z794 Long term (current) use of insulin: Secondary | ICD-10-CM | POA: Diagnosis not present

## 2021-04-13 DIAGNOSIS — E1152 Type 2 diabetes mellitus with diabetic peripheral angiopathy with gangrene: Secondary | ICD-10-CM | POA: Diagnosis not present

## 2021-04-13 DIAGNOSIS — D631 Anemia in chronic kidney disease: Secondary | ICD-10-CM | POA: Diagnosis not present

## 2021-04-13 DIAGNOSIS — E11621 Type 2 diabetes mellitus with foot ulcer: Secondary | ICD-10-CM | POA: Diagnosis not present

## 2021-04-13 DIAGNOSIS — N186 End stage renal disease: Secondary | ICD-10-CM | POA: Diagnosis not present

## 2021-04-13 DIAGNOSIS — E1151 Type 2 diabetes mellitus with diabetic peripheral angiopathy without gangrene: Secondary | ICD-10-CM | POA: Diagnosis not present

## 2021-04-13 DIAGNOSIS — L97511 Non-pressure chronic ulcer of other part of right foot limited to breakdown of skin: Secondary | ICD-10-CM | POA: Diagnosis not present

## 2021-04-13 DIAGNOSIS — I5032 Chronic diastolic (congestive) heart failure: Secondary | ICD-10-CM | POA: Diagnosis not present

## 2021-04-13 DIAGNOSIS — Z9981 Dependence on supplemental oxygen: Secondary | ICD-10-CM | POA: Diagnosis not present

## 2021-04-13 DIAGNOSIS — I132 Hypertensive heart and chronic kidney disease with heart failure and with stage 5 chronic kidney disease, or end stage renal disease: Secondary | ICD-10-CM | POA: Diagnosis not present

## 2021-04-13 DIAGNOSIS — E1122 Type 2 diabetes mellitus with diabetic chronic kidney disease: Secondary | ICD-10-CM | POA: Diagnosis not present

## 2021-04-14 DIAGNOSIS — Z992 Dependence on renal dialysis: Secondary | ICD-10-CM | POA: Diagnosis not present

## 2021-04-14 DIAGNOSIS — J9610 Chronic respiratory failure, unspecified whether with hypoxia or hypercapnia: Secondary | ICD-10-CM | POA: Diagnosis not present

## 2021-04-14 DIAGNOSIS — N186 End stage renal disease: Secondary | ICD-10-CM | POA: Diagnosis not present

## 2021-04-14 DIAGNOSIS — J449 Chronic obstructive pulmonary disease, unspecified: Secondary | ICD-10-CM | POA: Diagnosis not present

## 2021-04-16 DIAGNOSIS — Z9981 Dependence on supplemental oxygen: Secondary | ICD-10-CM | POA: Diagnosis not present

## 2021-04-16 DIAGNOSIS — E11621 Type 2 diabetes mellitus with foot ulcer: Secondary | ICD-10-CM | POA: Diagnosis not present

## 2021-04-16 DIAGNOSIS — I5032 Chronic diastolic (congestive) heart failure: Secondary | ICD-10-CM | POA: Diagnosis not present

## 2021-04-16 DIAGNOSIS — D631 Anemia in chronic kidney disease: Secondary | ICD-10-CM | POA: Diagnosis not present

## 2021-04-16 DIAGNOSIS — E1122 Type 2 diabetes mellitus with diabetic chronic kidney disease: Secondary | ICD-10-CM | POA: Diagnosis not present

## 2021-04-16 DIAGNOSIS — E1152 Type 2 diabetes mellitus with diabetic peripheral angiopathy with gangrene: Secondary | ICD-10-CM | POA: Diagnosis not present

## 2021-04-16 DIAGNOSIS — L97511 Non-pressure chronic ulcer of other part of right foot limited to breakdown of skin: Secondary | ICD-10-CM | POA: Diagnosis not present

## 2021-04-16 DIAGNOSIS — Z794 Long term (current) use of insulin: Secondary | ICD-10-CM | POA: Diagnosis not present

## 2021-04-16 DIAGNOSIS — Z992 Dependence on renal dialysis: Secondary | ICD-10-CM | POA: Diagnosis not present

## 2021-04-16 DIAGNOSIS — E1151 Type 2 diabetes mellitus with diabetic peripheral angiopathy without gangrene: Secondary | ICD-10-CM | POA: Diagnosis not present

## 2021-04-16 DIAGNOSIS — N186 End stage renal disease: Secondary | ICD-10-CM | POA: Diagnosis not present

## 2021-04-16 DIAGNOSIS — I132 Hypertensive heart and chronic kidney disease with heart failure and with stage 5 chronic kidney disease, or end stage renal disease: Secondary | ICD-10-CM | POA: Diagnosis not present

## 2021-04-17 ENCOUNTER — Other Ambulatory Visit: Payer: Self-pay | Admitting: Pulmonary Disease

## 2021-04-17 DIAGNOSIS — Z992 Dependence on renal dialysis: Secondary | ICD-10-CM | POA: Diagnosis not present

## 2021-04-17 DIAGNOSIS — N186 End stage renal disease: Secondary | ICD-10-CM | POA: Diagnosis not present

## 2021-04-18 ENCOUNTER — Encounter: Payer: Self-pay | Admitting: Pulmonary Disease

## 2021-04-18 DIAGNOSIS — Z794 Long term (current) use of insulin: Secondary | ICD-10-CM | POA: Diagnosis not present

## 2021-04-18 DIAGNOSIS — E1152 Type 2 diabetes mellitus with diabetic peripheral angiopathy with gangrene: Secondary | ICD-10-CM | POA: Diagnosis not present

## 2021-04-18 DIAGNOSIS — L97511 Non-pressure chronic ulcer of other part of right foot limited to breakdown of skin: Secondary | ICD-10-CM | POA: Diagnosis not present

## 2021-04-18 DIAGNOSIS — Z992 Dependence on renal dialysis: Secondary | ICD-10-CM | POA: Diagnosis not present

## 2021-04-18 DIAGNOSIS — E11621 Type 2 diabetes mellitus with foot ulcer: Secondary | ICD-10-CM | POA: Diagnosis not present

## 2021-04-18 DIAGNOSIS — I5032 Chronic diastolic (congestive) heart failure: Secondary | ICD-10-CM | POA: Diagnosis not present

## 2021-04-18 DIAGNOSIS — E1151 Type 2 diabetes mellitus with diabetic peripheral angiopathy without gangrene: Secondary | ICD-10-CM | POA: Diagnosis not present

## 2021-04-18 DIAGNOSIS — E1122 Type 2 diabetes mellitus with diabetic chronic kidney disease: Secondary | ICD-10-CM | POA: Diagnosis not present

## 2021-04-18 DIAGNOSIS — Z9981 Dependence on supplemental oxygen: Secondary | ICD-10-CM | POA: Diagnosis not present

## 2021-04-18 DIAGNOSIS — N186 End stage renal disease: Secondary | ICD-10-CM | POA: Diagnosis not present

## 2021-04-18 DIAGNOSIS — I132 Hypertensive heart and chronic kidney disease with heart failure and with stage 5 chronic kidney disease, or end stage renal disease: Secondary | ICD-10-CM | POA: Diagnosis not present

## 2021-04-18 DIAGNOSIS — D631 Anemia in chronic kidney disease: Secondary | ICD-10-CM | POA: Diagnosis not present

## 2021-04-20 DIAGNOSIS — I5032 Chronic diastolic (congestive) heart failure: Secondary | ICD-10-CM | POA: Diagnosis not present

## 2021-04-20 DIAGNOSIS — J449 Chronic obstructive pulmonary disease, unspecified: Secondary | ICD-10-CM | POA: Diagnosis not present

## 2021-04-20 DIAGNOSIS — J9611 Chronic respiratory failure with hypoxia: Secondary | ICD-10-CM | POA: Diagnosis not present

## 2021-04-21 ENCOUNTER — Emergency Department: Payer: Medicare Other

## 2021-04-21 ENCOUNTER — Encounter: Payer: Self-pay | Admitting: Emergency Medicine

## 2021-04-21 ENCOUNTER — Inpatient Hospital Stay: Payer: Medicare Other

## 2021-04-21 ENCOUNTER — Other Ambulatory Visit: Payer: Self-pay

## 2021-04-21 ENCOUNTER — Inpatient Hospital Stay
Admission: EM | Admit: 2021-04-21 | Discharge: 2021-05-01 | DRG: 987 | Disposition: A | Discharge status: Home / Self Care | Payer: Medicare Other | Attending: Student in an Organized Health Care Education/Training Program | Admitting: Student in an Organized Health Care Education/Training Program

## 2021-04-21 DIAGNOSIS — K5900 Constipation, unspecified: Secondary | ICD-10-CM | POA: Diagnosis not present

## 2021-04-21 DIAGNOSIS — L97514 Non-pressure chronic ulcer of other part of right foot with necrosis of bone: Secondary | ICD-10-CM | POA: Diagnosis not present

## 2021-04-21 DIAGNOSIS — L97516 Non-pressure chronic ulcer of other part of right foot with bone involvement without evidence of necrosis: Secondary | ICD-10-CM | POA: Diagnosis not present

## 2021-04-21 DIAGNOSIS — K859 Acute pancreatitis without necrosis or infection, unspecified: Secondary | ICD-10-CM | POA: Diagnosis not present

## 2021-04-21 DIAGNOSIS — I132 Hypertensive heart and chronic kidney disease with heart failure and with stage 5 chronic kidney disease, or end stage renal disease: Secondary | ICD-10-CM | POA: Diagnosis present

## 2021-04-21 DIAGNOSIS — E1169 Type 2 diabetes mellitus with other specified complication: Secondary | ICD-10-CM | POA: Diagnosis present

## 2021-04-21 DIAGNOSIS — E875 Hyperkalemia: Secondary | ICD-10-CM | POA: Diagnosis present

## 2021-04-21 DIAGNOSIS — Z882 Allergy status to sulfonamides status: Secondary | ICD-10-CM

## 2021-04-21 DIAGNOSIS — E1122 Type 2 diabetes mellitus with diabetic chronic kidney disease: Secondary | ICD-10-CM | POA: Diagnosis not present

## 2021-04-21 DIAGNOSIS — I70235 Atherosclerosis of native arteries of right leg with ulceration of other part of foot: Secondary | ICD-10-CM | POA: Diagnosis not present

## 2021-04-21 DIAGNOSIS — K8689 Other specified diseases of pancreas: Secondary | ICD-10-CM | POA: Diagnosis not present

## 2021-04-21 DIAGNOSIS — R109 Unspecified abdominal pain: Secondary | ICD-10-CM | POA: Diagnosis not present

## 2021-04-21 DIAGNOSIS — K861 Other chronic pancreatitis: Secondary | ICD-10-CM | POA: Diagnosis not present

## 2021-04-21 DIAGNOSIS — E1165 Type 2 diabetes mellitus with hyperglycemia: Secondary | ICD-10-CM | POA: Diagnosis not present

## 2021-04-21 DIAGNOSIS — Z7982 Long term (current) use of aspirin: Secondary | ICD-10-CM

## 2021-04-21 DIAGNOSIS — Z992 Dependence on renal dialysis: Secondary | ICD-10-CM

## 2021-04-21 DIAGNOSIS — Z794 Long term (current) use of insulin: Secondary | ICD-10-CM

## 2021-04-21 DIAGNOSIS — Z6841 Body Mass Index (BMI) 40.0 and over, adult: Secondary | ICD-10-CM | POA: Diagnosis not present

## 2021-04-21 DIAGNOSIS — E8881 Metabolic syndrome: Secondary | ICD-10-CM | POA: Diagnosis present

## 2021-04-21 DIAGNOSIS — K219 Gastro-esophageal reflux disease without esophagitis: Secondary | ICD-10-CM | POA: Diagnosis present

## 2021-04-21 DIAGNOSIS — E8729 Other acidosis: Secondary | ICD-10-CM

## 2021-04-21 DIAGNOSIS — M868X7 Other osteomyelitis, ankle and foot: Secondary | ICD-10-CM | POA: Diagnosis not present

## 2021-04-21 DIAGNOSIS — R1011 Right upper quadrant pain: Secondary | ICD-10-CM | POA: Diagnosis not present

## 2021-04-21 DIAGNOSIS — Z9981 Dependence on supplemental oxygen: Secondary | ICD-10-CM

## 2021-04-21 DIAGNOSIS — S91301A Unspecified open wound, right foot, initial encounter: Secondary | ICD-10-CM | POA: Diagnosis not present

## 2021-04-21 DIAGNOSIS — E11621 Type 2 diabetes mellitus with foot ulcer: Secondary | ICD-10-CM

## 2021-04-21 DIAGNOSIS — I10 Essential (primary) hypertension: Secondary | ICD-10-CM | POA: Diagnosis not present

## 2021-04-21 DIAGNOSIS — Z833 Family history of diabetes mellitus: Secondary | ICD-10-CM

## 2021-04-21 DIAGNOSIS — D631 Anemia in chronic kidney disease: Secondary | ICD-10-CM | POA: Diagnosis not present

## 2021-04-21 DIAGNOSIS — N2581 Secondary hyperparathyroidism of renal origin: Secondary | ICD-10-CM | POA: Diagnosis not present

## 2021-04-21 DIAGNOSIS — J449 Chronic obstructive pulmonary disease, unspecified: Secondary | ICD-10-CM | POA: Diagnosis not present

## 2021-04-21 DIAGNOSIS — J9611 Chronic respiratory failure with hypoxia: Secondary | ICD-10-CM | POA: Diagnosis present

## 2021-04-21 DIAGNOSIS — J302 Other seasonal allergic rhinitis: Secondary | ICD-10-CM | POA: Diagnosis present

## 2021-04-21 DIAGNOSIS — I5040 Unspecified combined systolic (congestive) and diastolic (congestive) heart failure: Secondary | ICD-10-CM | POA: Diagnosis not present

## 2021-04-21 DIAGNOSIS — E662 Morbid (severe) obesity with alveolar hypoventilation: Secondary | ICD-10-CM | POA: Diagnosis present

## 2021-04-21 DIAGNOSIS — J441 Chronic obstructive pulmonary disease with (acute) exacerbation: Secondary | ICD-10-CM | POA: Diagnosis not present

## 2021-04-21 DIAGNOSIS — R0602 Shortness of breath: Secondary | ICD-10-CM

## 2021-04-21 DIAGNOSIS — J9612 Chronic respiratory failure with hypercapnia: Secondary | ICD-10-CM | POA: Diagnosis present

## 2021-04-21 DIAGNOSIS — N186 End stage renal disease: Secondary | ICD-10-CM | POA: Diagnosis not present

## 2021-04-21 DIAGNOSIS — Z8739 Personal history of other diseases of the musculoskeletal system and connective tissue: Secondary | ICD-10-CM | POA: Diagnosis not present

## 2021-04-21 DIAGNOSIS — E876 Hypokalemia: Secondary | ICD-10-CM | POA: Diagnosis present

## 2021-04-21 DIAGNOSIS — I5032 Chronic diastolic (congestive) heart failure: Secondary | ICD-10-CM | POA: Diagnosis not present

## 2021-04-21 DIAGNOSIS — Z8249 Family history of ischemic heart disease and other diseases of the circulatory system: Secondary | ICD-10-CM

## 2021-04-21 DIAGNOSIS — Z803 Family history of malignant neoplasm of breast: Secondary | ICD-10-CM

## 2021-04-21 DIAGNOSIS — Z20822 Contact with and (suspected) exposure to covid-19: Secondary | ICD-10-CM | POA: Diagnosis not present

## 2021-04-21 DIAGNOSIS — L989 Disorder of the skin and subcutaneous tissue, unspecified: Secondary | ICD-10-CM | POA: Diagnosis not present

## 2021-04-21 DIAGNOSIS — G4733 Obstructive sleep apnea (adult) (pediatric): Secondary | ICD-10-CM | POA: Diagnosis present

## 2021-04-21 DIAGNOSIS — D500 Iron deficiency anemia secondary to blood loss (chronic): Secondary | ICD-10-CM | POA: Diagnosis present

## 2021-04-21 DIAGNOSIS — Z9889 Other specified postprocedural states: Secondary | ICD-10-CM

## 2021-04-21 DIAGNOSIS — J4550 Severe persistent asthma, uncomplicated: Secondary | ICD-10-CM | POA: Diagnosis not present

## 2021-04-21 DIAGNOSIS — M86071 Acute hematogenous osteomyelitis, right ankle and foot: Secondary | ICD-10-CM | POA: Diagnosis present

## 2021-04-21 DIAGNOSIS — I120 Hypertensive chronic kidney disease with stage 5 chronic kidney disease or end stage renal disease: Secondary | ICD-10-CM | POA: Diagnosis not present

## 2021-04-21 DIAGNOSIS — Z9115 Patient's noncompliance with renal dialysis: Secondary | ICD-10-CM

## 2021-04-21 DIAGNOSIS — E8721 Acute metabolic acidosis: Secondary | ICD-10-CM | POA: Diagnosis not present

## 2021-04-21 DIAGNOSIS — L97509 Non-pressure chronic ulcer of other part of unspecified foot with unspecified severity: Secondary | ICD-10-CM | POA: Diagnosis present

## 2021-04-21 DIAGNOSIS — M7989 Other specified soft tissue disorders: Secondary | ICD-10-CM | POA: Diagnosis not present

## 2021-04-21 DIAGNOSIS — R079 Chest pain, unspecified: Secondary | ICD-10-CM | POA: Diagnosis not present

## 2021-04-21 DIAGNOSIS — I739 Peripheral vascular disease, unspecified: Secondary | ICD-10-CM | POA: Diagnosis not present

## 2021-04-21 DIAGNOSIS — I5033 Acute on chronic diastolic (congestive) heart failure: Secondary | ICD-10-CM | POA: Diagnosis not present

## 2021-04-21 DIAGNOSIS — Z7951 Long term (current) use of inhaled steroids: Secondary | ICD-10-CM

## 2021-04-21 DIAGNOSIS — R1110 Vomiting, unspecified: Secondary | ICD-10-CM | POA: Diagnosis not present

## 2021-04-21 DIAGNOSIS — M869 Osteomyelitis, unspecified: Secondary | ICD-10-CM | POA: Diagnosis not present

## 2021-04-21 DIAGNOSIS — E1152 Type 2 diabetes mellitus with diabetic peripheral angiopathy with gangrene: Secondary | ICD-10-CM | POA: Diagnosis present

## 2021-04-21 DIAGNOSIS — L97401 Non-pressure chronic ulcer of unspecified heel and midfoot limited to breakdown of skin: Secondary | ICD-10-CM | POA: Diagnosis not present

## 2021-04-21 DIAGNOSIS — I129 Hypertensive chronic kidney disease with stage 1 through stage 4 chronic kidney disease, or unspecified chronic kidney disease: Secondary | ICD-10-CM | POA: Diagnosis not present

## 2021-04-21 DIAGNOSIS — R110 Nausea: Secondary | ICD-10-CM | POA: Diagnosis not present

## 2021-04-21 DIAGNOSIS — Z89431 Acquired absence of right foot: Secondary | ICD-10-CM

## 2021-04-21 DIAGNOSIS — K828 Other specified diseases of gallbladder: Secondary | ICD-10-CM | POA: Diagnosis not present

## 2021-04-21 DIAGNOSIS — I509 Heart failure, unspecified: Secondary | ICD-10-CM | POA: Diagnosis not present

## 2021-04-21 DIAGNOSIS — E119 Type 2 diabetes mellitus without complications: Secondary | ICD-10-CM | POA: Diagnosis present

## 2021-04-21 DIAGNOSIS — Z888 Allergy status to other drugs, medicaments and biological substances status: Secondary | ICD-10-CM

## 2021-04-21 DIAGNOSIS — R0902 Hypoxemia: Secondary | ICD-10-CM | POA: Diagnosis not present

## 2021-04-21 DIAGNOSIS — E871 Hypo-osmolality and hyponatremia: Secondary | ICD-10-CM | POA: Diagnosis present

## 2021-04-21 DIAGNOSIS — Z79899 Other long term (current) drug therapy: Secondary | ICD-10-CM

## 2021-04-21 DIAGNOSIS — J45909 Unspecified asthma, uncomplicated: Secondary | ICD-10-CM | POA: Diagnosis not present

## 2021-04-21 DIAGNOSIS — K8500 Idiopathic acute pancreatitis without necrosis or infection: Secondary | ICD-10-CM | POA: Diagnosis not present

## 2021-04-21 LAB — CBC
HCT: 44.5 % (ref 36.0–46.0)
Hemoglobin: 13.5 g/dL (ref 12.0–15.0)
MCH: 27.8 pg (ref 26.0–34.0)
MCHC: 30.3 g/dL (ref 30.0–36.0)
MCV: 91.6 fL (ref 80.0–100.0)
Platelets: 294 10*3/uL (ref 150–400)
RBC: 4.86 MIL/uL (ref 3.87–5.11)
RDW: 14.8 % (ref 11.5–15.5)
WBC: 9.3 10*3/uL (ref 4.0–10.5)
nRBC: 0 % (ref 0.0–0.2)

## 2021-04-21 LAB — COMPREHENSIVE METABOLIC PANEL
ALT: 19 U/L (ref 0–44)
AST: 20 U/L (ref 15–41)
Albumin: 4.3 g/dL (ref 3.5–5.0)
Alkaline Phosphatase: 357 U/L — ABNORMAL HIGH (ref 38–126)
Anion gap: 22 — ABNORMAL HIGH (ref 5–15)
BUN: 73 mg/dL — ABNORMAL HIGH (ref 6–20)
CO2: 19 mmol/L — ABNORMAL LOW (ref 22–32)
Calcium: 8.8 mg/dL — ABNORMAL LOW (ref 8.9–10.3)
Chloride: 87 mmol/L — ABNORMAL LOW (ref 98–111)
Creatinine, Ser: 10.95 mg/dL — ABNORMAL HIGH (ref 0.44–1.00)
GFR, Estimated: 4 mL/min — ABNORMAL LOW (ref >60)
Glucose, Bld: 125 mg/dL — ABNORMAL HIGH (ref 70–99)
Potassium: 5.6 mmol/L — ABNORMAL HIGH (ref 3.5–5.1)
Sodium: 128 mmol/L — ABNORMAL LOW (ref 135–145)
Total Bilirubin: 0.6 mg/dL (ref 0.3–1.2)
Total Protein: 8.9 g/dL — ABNORMAL HIGH (ref 6.5–8.1)

## 2021-04-21 LAB — RESP PANEL BY RT-PCR (FLU A&B, COVID) ARPGX2
Influenza A by PCR: NEGATIVE
Influenza B by PCR: NEGATIVE
SARS Coronavirus 2 by RT PCR: NEGATIVE

## 2021-04-21 LAB — HCG, QUANTITATIVE, PREGNANCY: hCG, Beta Chain, Quant, S: 3 m[IU]/mL (ref <5)

## 2021-04-21 LAB — TRIGLYCERIDES: Triglycerides: 151 mg/dL — ABNORMAL HIGH (ref <150)

## 2021-04-21 LAB — LIPASE, BLOOD: Lipase: 439 U/L — ABNORMAL HIGH (ref 11–51)

## 2021-04-21 IMAGING — DX DG FOOT 2V*R*
2 series · 2 of 2 positions shown · non-contrast
Comparison: [DATE]

CLINICAL DATA: Nonhealing ulcer right foot

EXAM:
RIGHT FOOT - 2 VIEW

[foot ap]
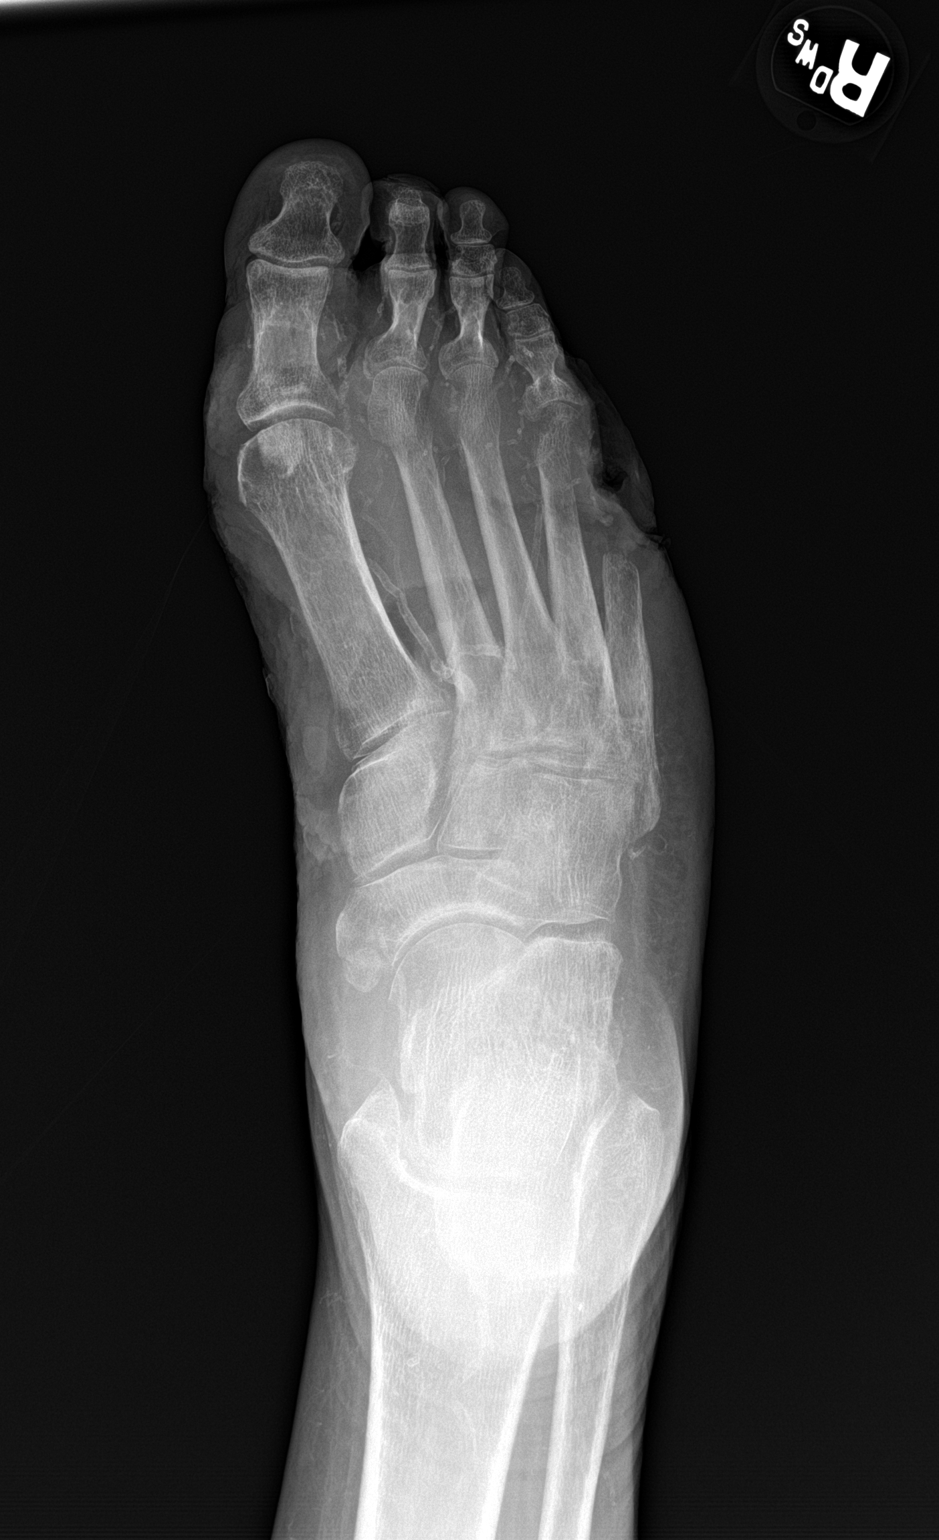

[foot lat]
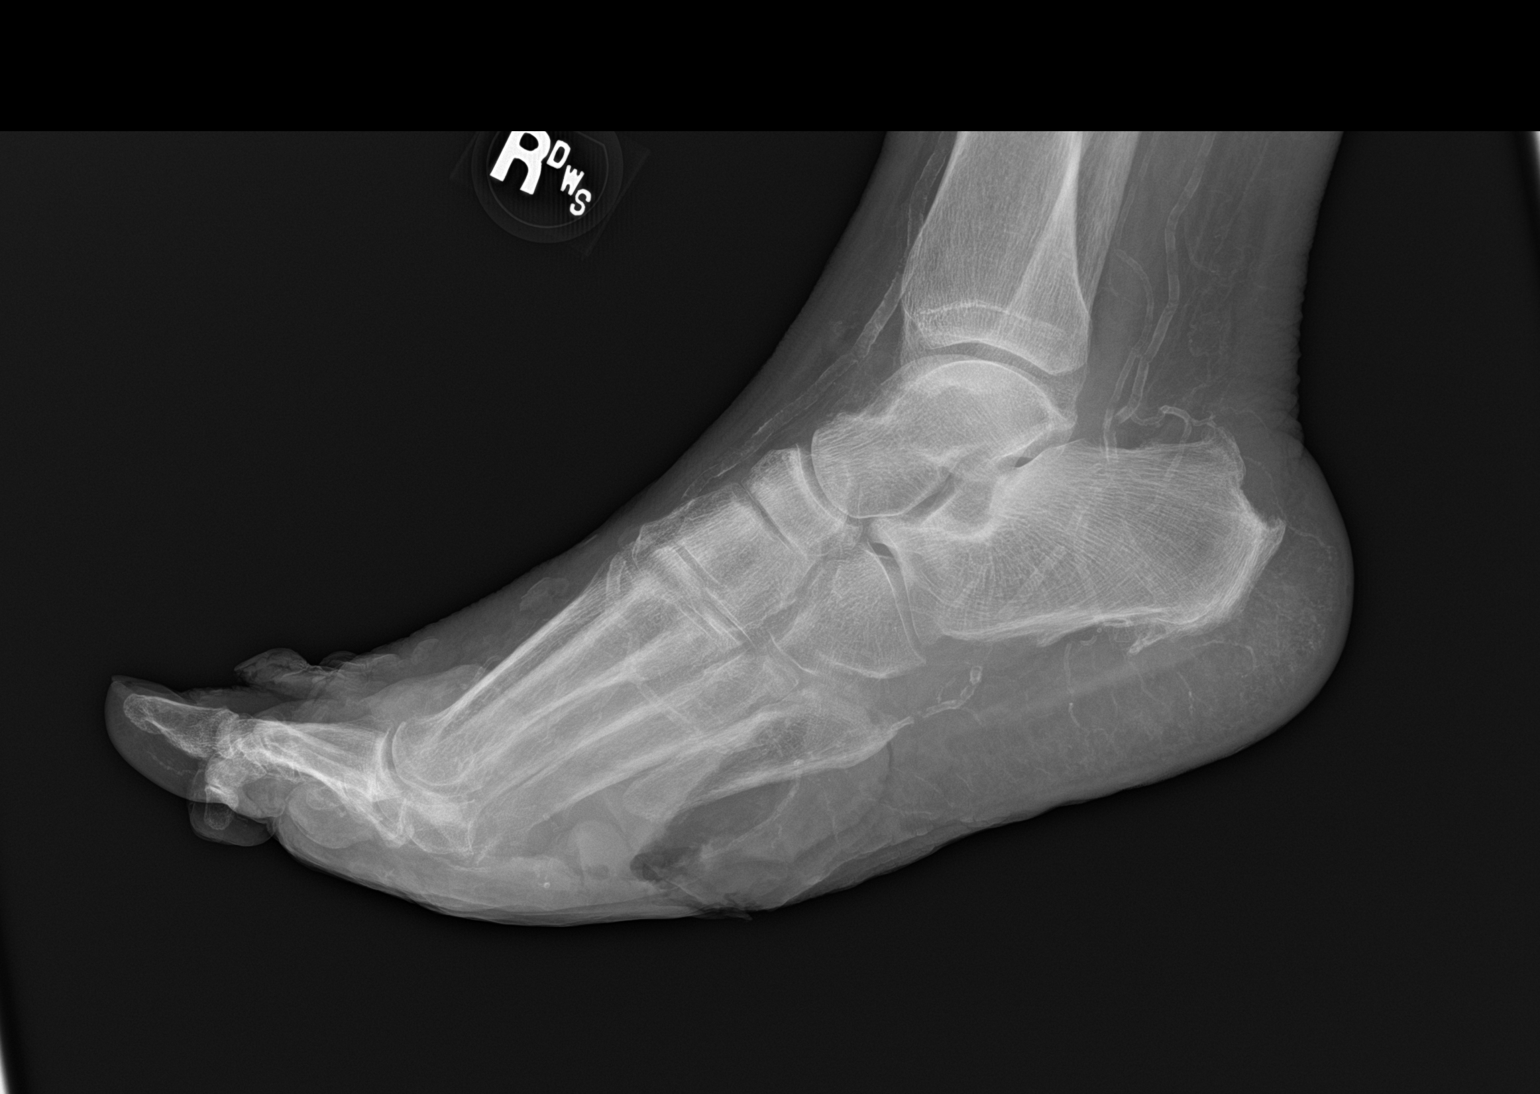

[2 of 2 positions shown; findings below may reference images not displayed]

FINDINGS: There is previous surgical removal of distal portion of fifth
metatarsal and fifth toe. No definite recent fracture is seen. There
is subtle low-density in the head of the fourth metatarsal. No
recent fracture or dislocation is seen. Plantar spur is seen in the
calcaneus. Extensive arterial calcifications are seen. There is soft
tissue swelling along the plantar aspect of forefoot. There is air
in the soft tissues along the plantar aspect of forefoot.
IMPRESSION: There is faint lucency in the head of the right fourth metatarsal.
Possibility of osteomyelitis is not excluded. Follow-up MRI as
clinically warranted should be considered.

## 2021-04-21 IMAGING — DX DG CHEST 1V PORT
1 series · 1 of 1 positions shown · non-contrast
Comparison: [DATE]

CLINICAL DATA: Shortness of breath, pancreatitis, nausea, medical
history significant of HTN, COPD, chronic respiratory failure with
hypoxia and hypercapnia on 3 L of oxygen at baseline, ESRD on
HD(TTS), DM type II, and gangrene with associated osteomyelitis
status post fifth ray amputation of the right foot presents with
complaints of 3-4 day of abdominal pain

EXAM:
PORTABLE CHEST 1 VIEW

[chest ap]
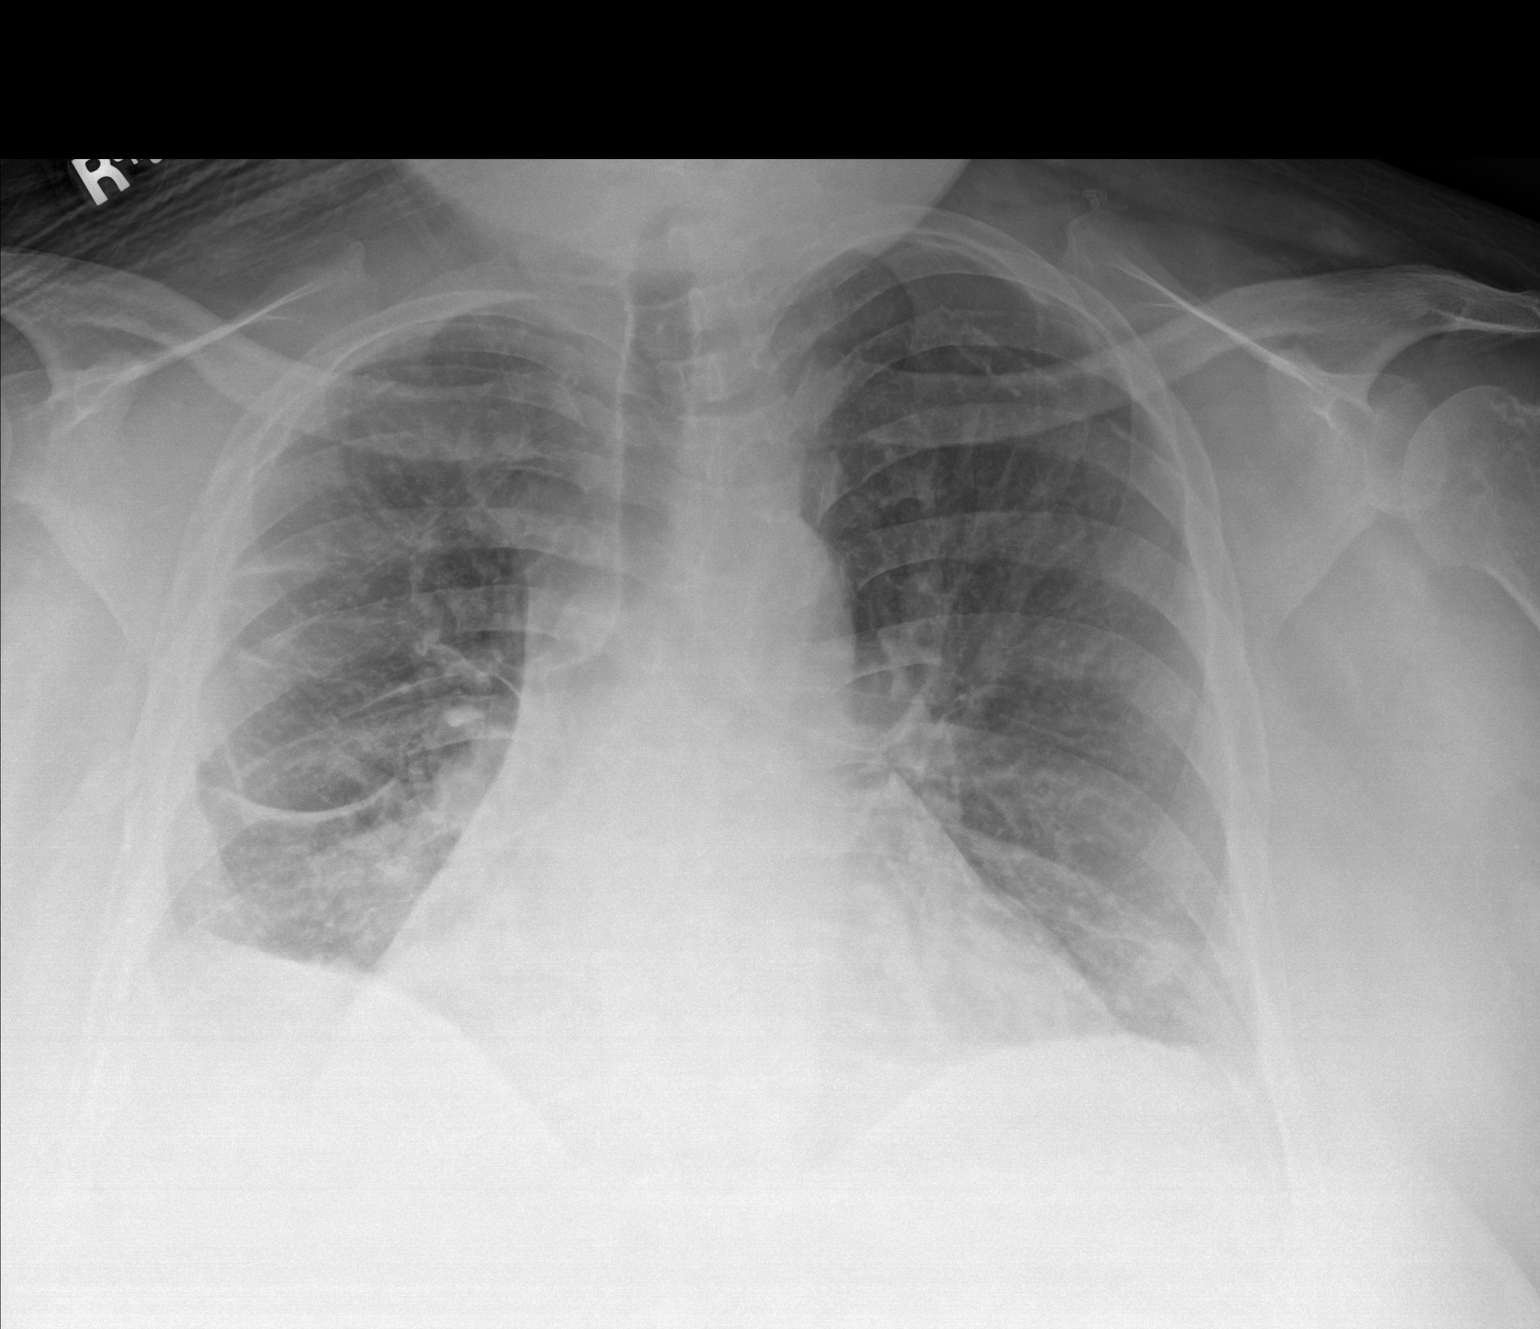

[1 of 1 positions shown; findings below may reference images not displayed]

FINDINGS: Cardiac silhouette is enlarged, but stable. No mediastinal or hilar
masses.

Linear opacities in the right mid to lower lung. Right lateral
hemidiaphragm pleural tenting. This is similar to the prior study,
likely atelectasis/pleural scarring. Mild interstitial thickening.
No lung consolidation.

No pneumothorax.

Skeletal structures grossly intact.
IMPRESSION: 1. No acute cardiopulmonary disease.
2. Chronic areas of right lung scarring or atelectasis with
associated diaphragmatic base pleural thickening/scarring. Findings
stable from the prior chest radiograph.

## 2021-04-21 IMAGING — US US ABDOMEN LIMITED
1 series · 14 of 25 positions shown · non-contrast
Comparison: None.

CLINICAL DATA: Right upper quadrant pain for 3 days.

EXAM:
ULTRASOUND ABDOMEN LIMITED RIGHT UPPER QUADRANT

[Series 1: us abdomen limited ruq (liver/gb) · 14 of 118 slices shown]
[im 1/118]
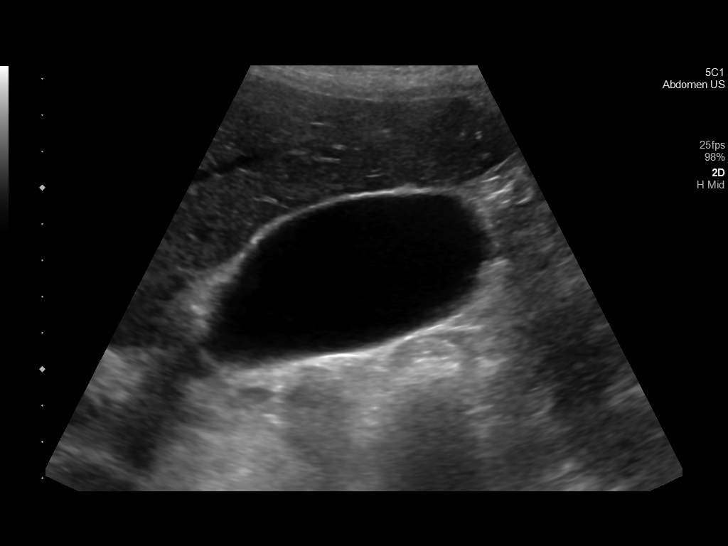
[im 10/118]
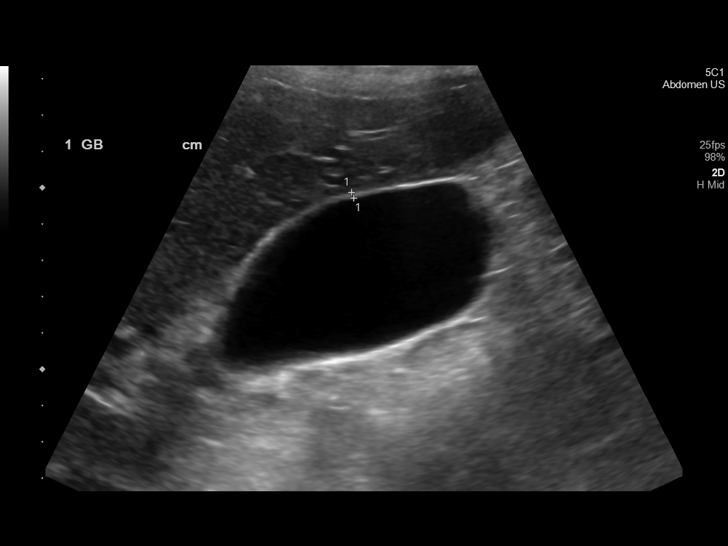
[im 20/118]
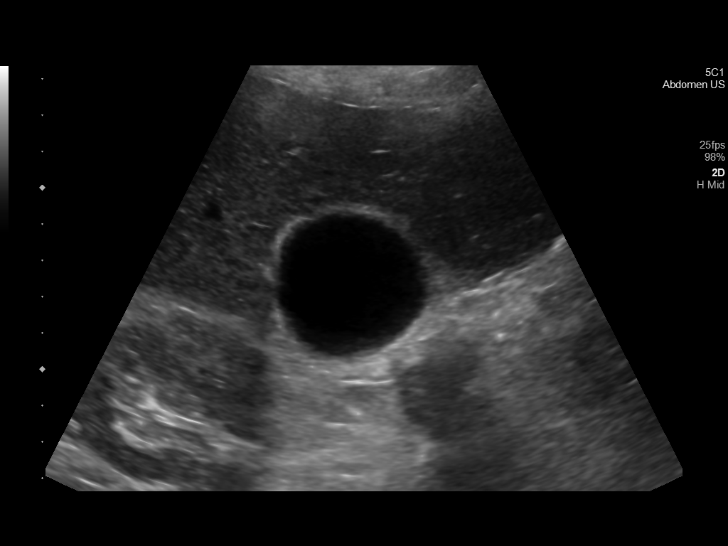
[im 30/118]
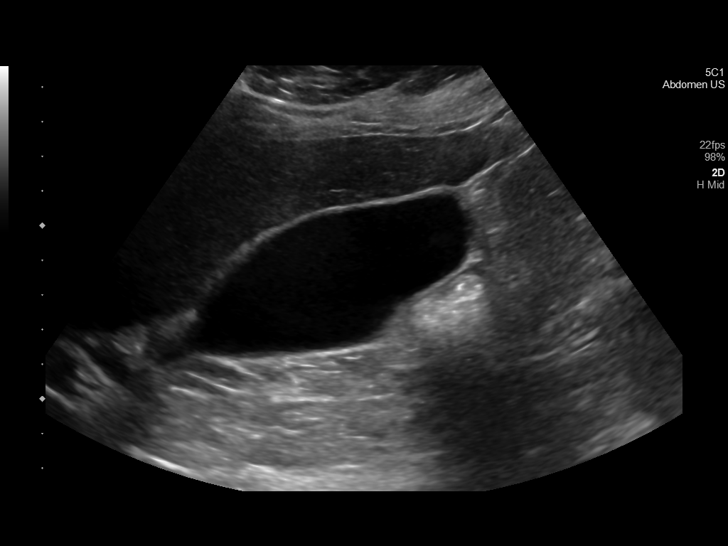
[im 40/118]
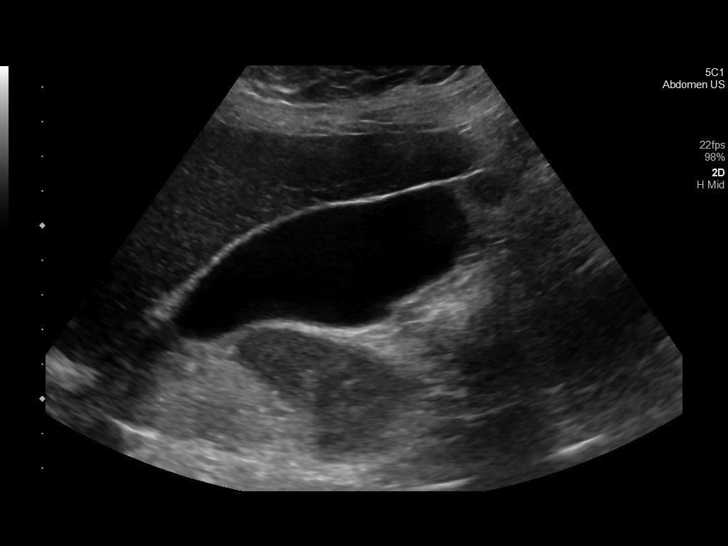
[im 44/118]
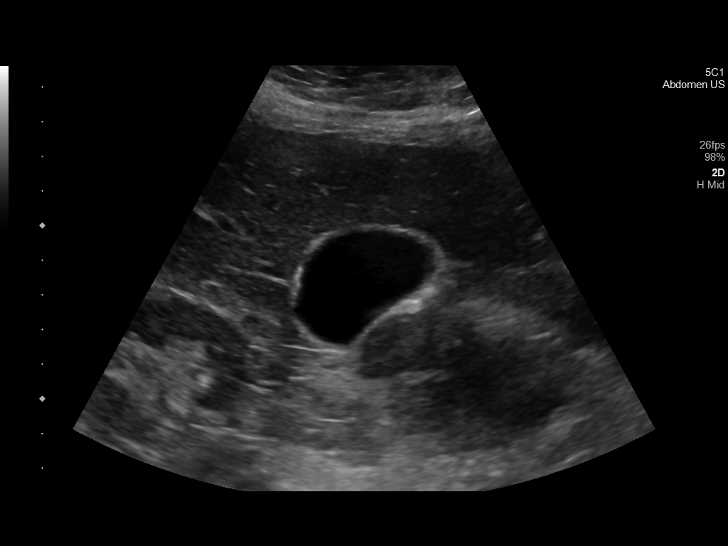
[im 54/118]
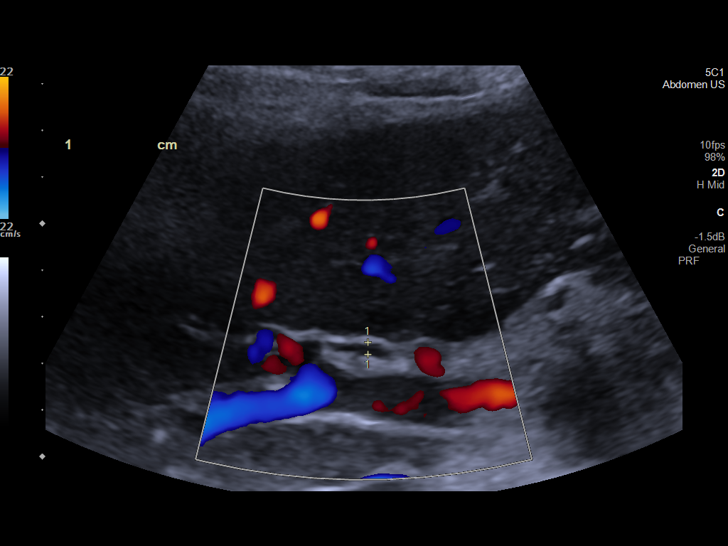
[im 64/118]
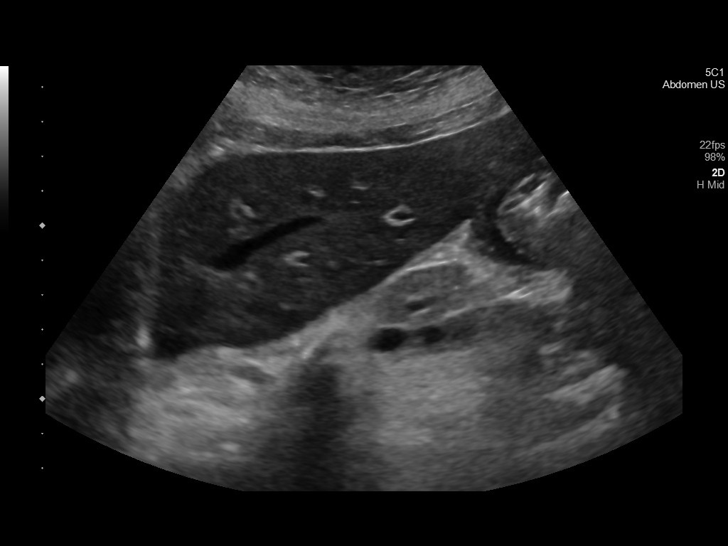
[im 74/118]
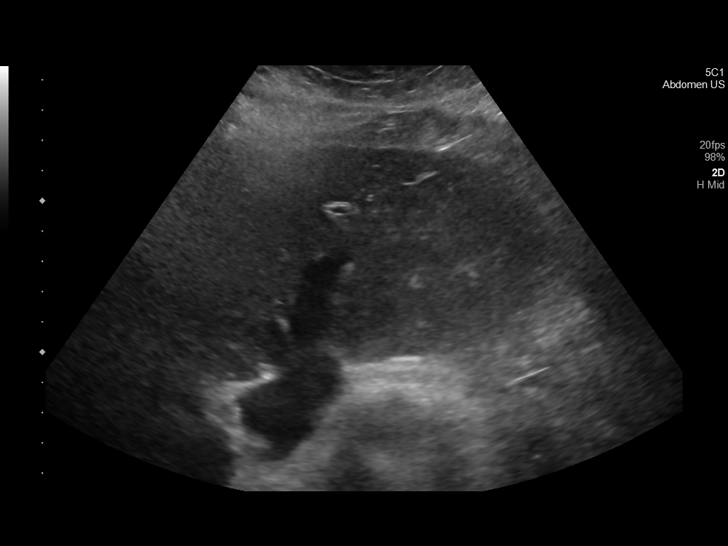
[im 79/118]
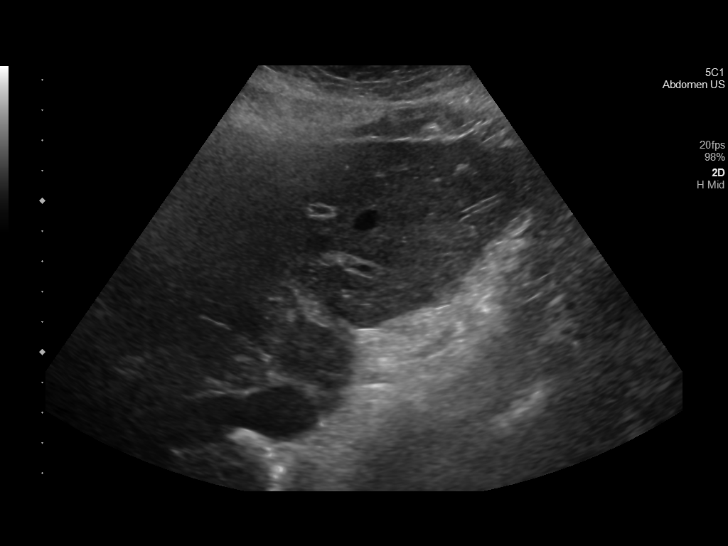
[im 88/118]
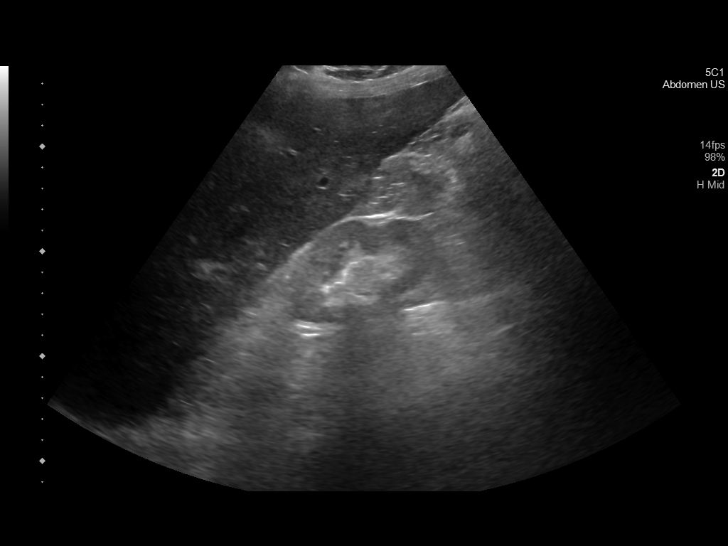
[im 98/118]
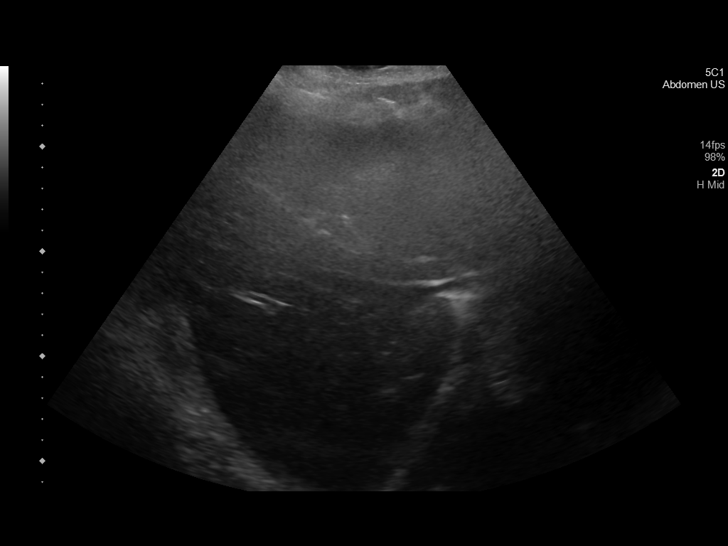
[im 108/118]
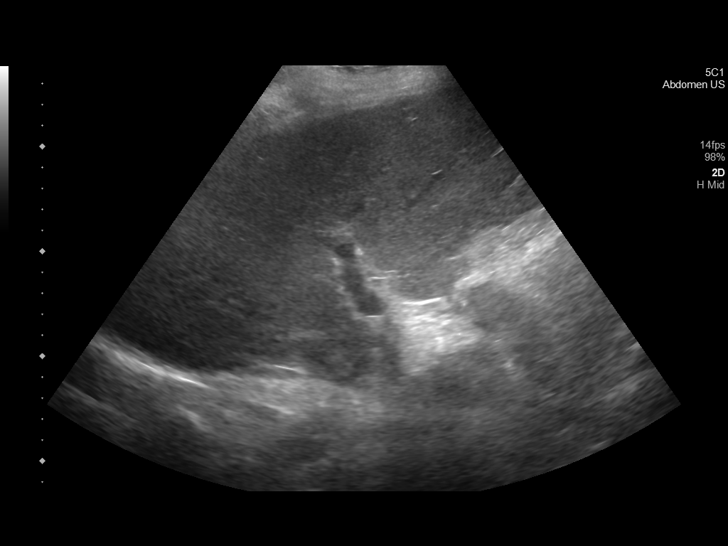
[im 118/118]
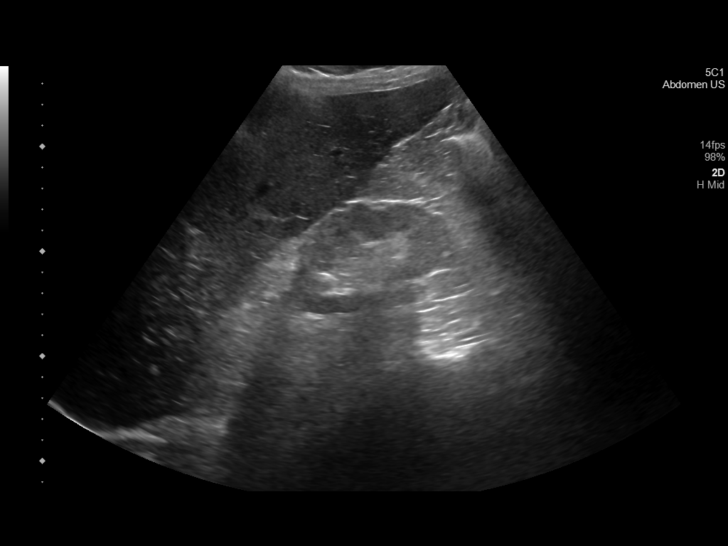

[14 of 25 positions shown; findings below may reference images not displayed]

FINDINGS: Gallbladder:

No gallstones or wall thickening visualized. No sonographic Murphy
sign noted by sonographer.

Common bile duct:

Diameter: 3 mm

Liver:

No focal lesion identified. Within normal limits in parenchymal
echogenicity. Portal vein is patent on color Doppler imaging with
normal direction of blood flow towards the liver.

Other: None.
IMPRESSION: Normal right upper quadrant ultrasound.

## 2021-04-21 IMAGING — CT CT ABD-PELV W/O CM
2 of 4 series · 16 of 46 positions shown, 18 images · non-contrast
Comparison: [DATE]

CLINICAL DATA: Abdominal pain and nausea and vomiting. Dialysis
patient.



[Series 2: routine abd/pel wo · axial · 0.92mm/px · z∈[-817,-362]mm · 13 of 101 slices shown, 15 images]
[im 5/101  soft-tissue]
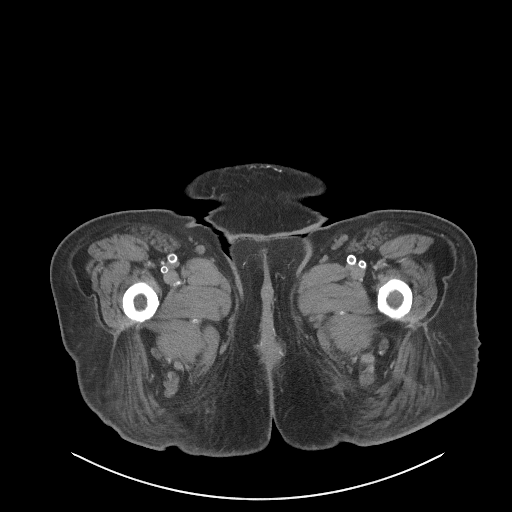
[im 5/101  bone]
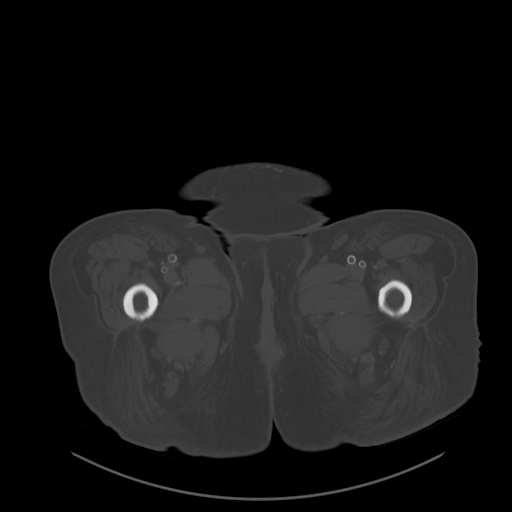
[im 14/101  soft-tissue]
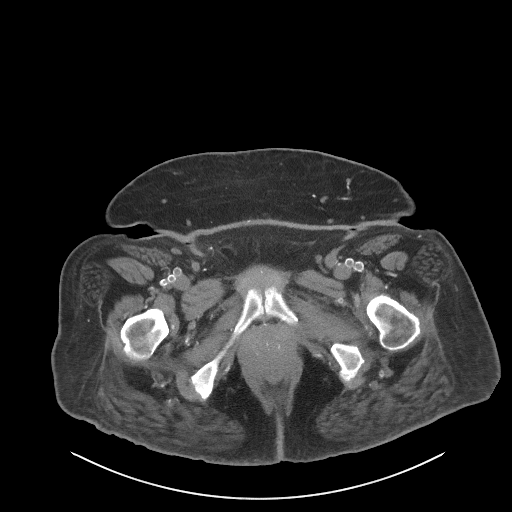
[im 22/101  soft-tissue]
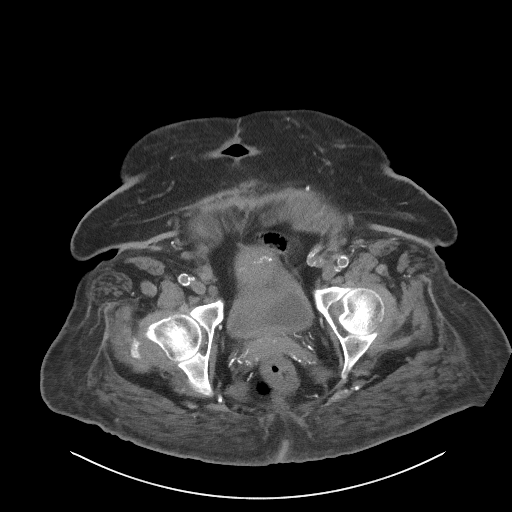
[im 27/101  soft-tissue]
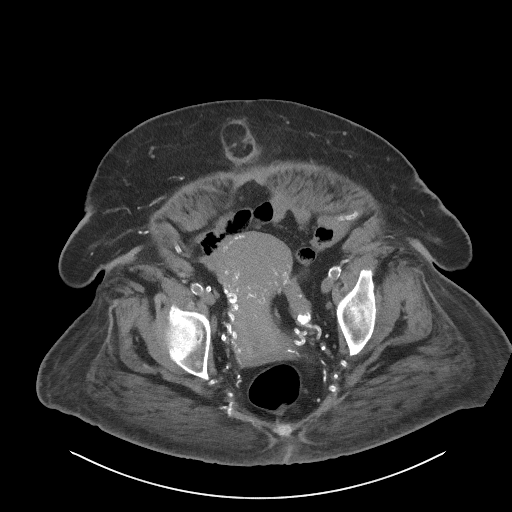
[im 35/101  soft-tissue]
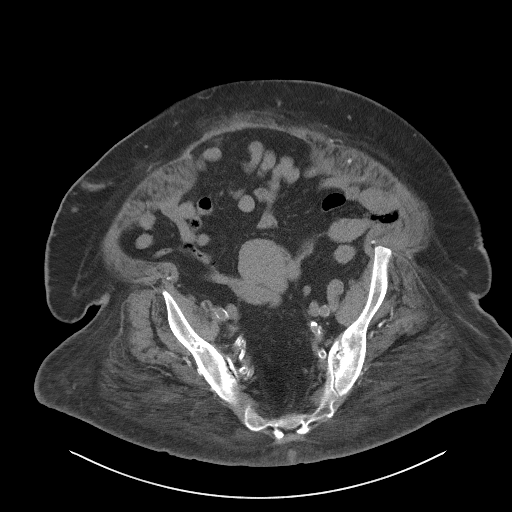
[im 44/101  soft-tissue]
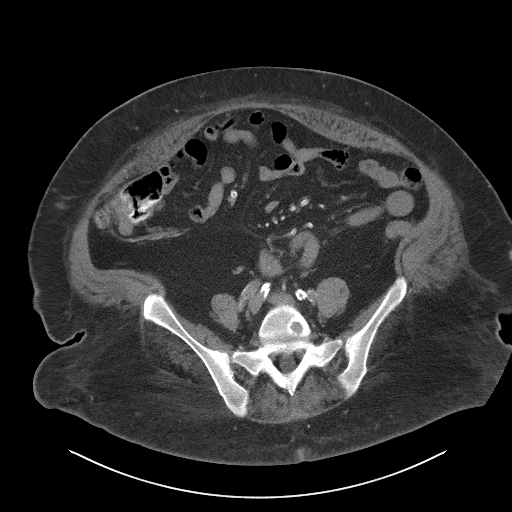
[im 53/101  soft-tissue]
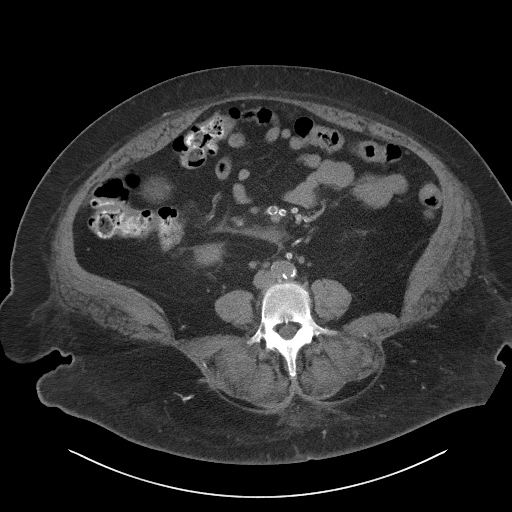
[im 57/101  soft-tissue]
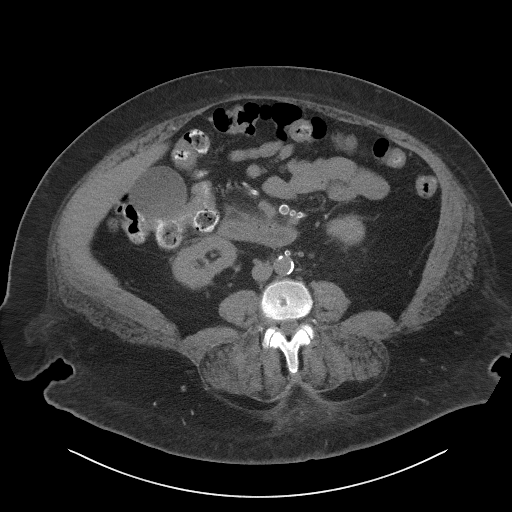
[im 66/101  soft-tissue]
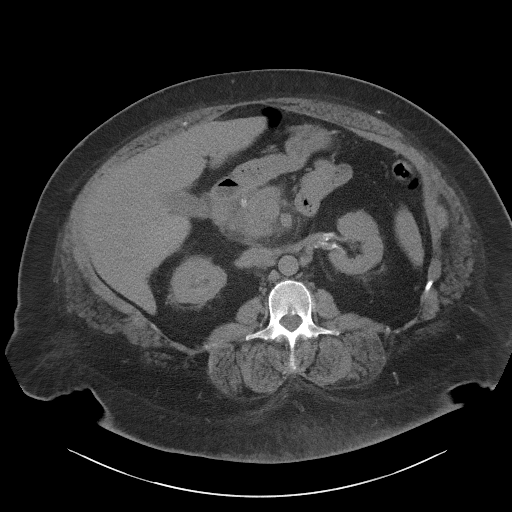
[im 66/101  bone]
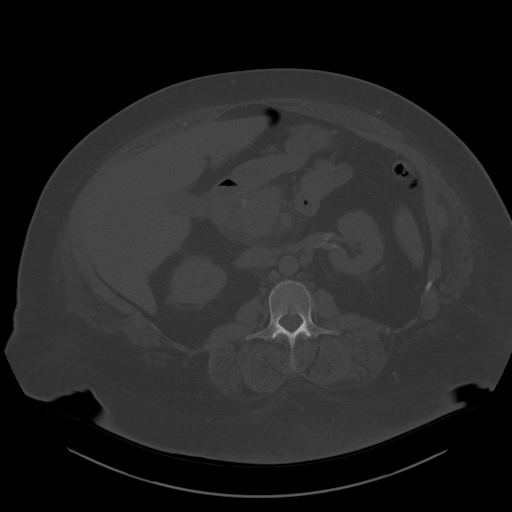
[im 74/101  soft-tissue]
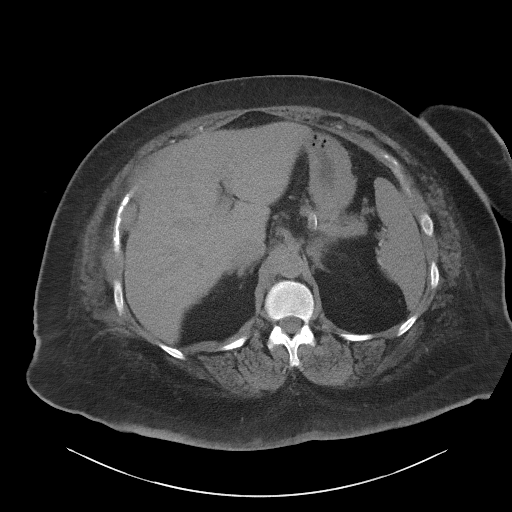
[im 79/101  soft-tissue]
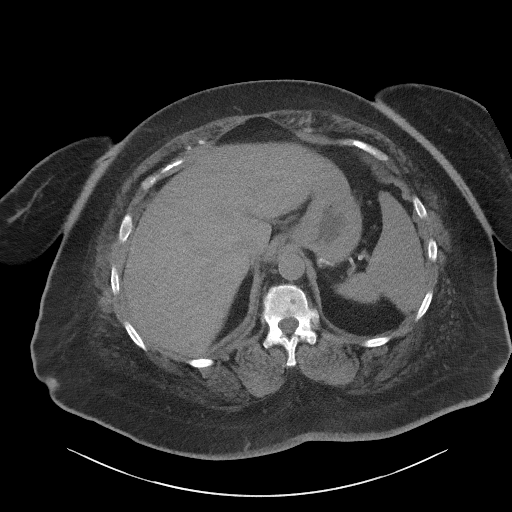
[im 87/101  soft-tissue]
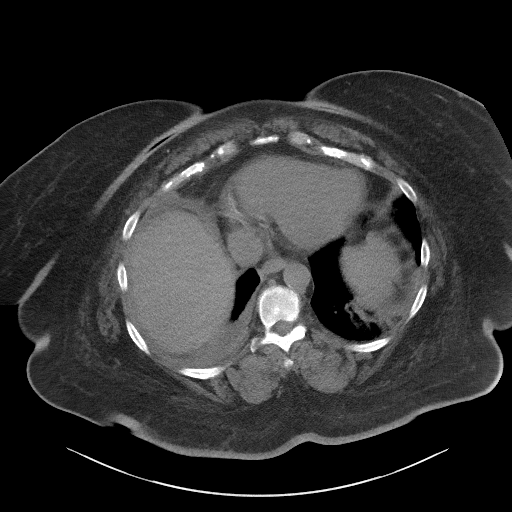
[im 96/101  soft-tissue]
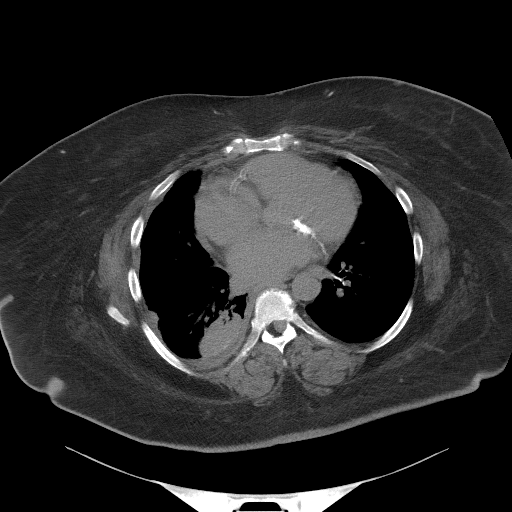

[Series 5: coronal st · coronal · 1.01mm/px · 3 of 124 slices shown]
[im 42/124  soft-tissue]
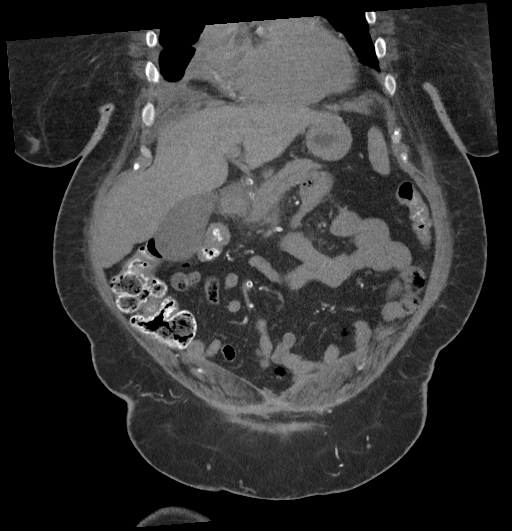
[im 55/124  soft-tissue]
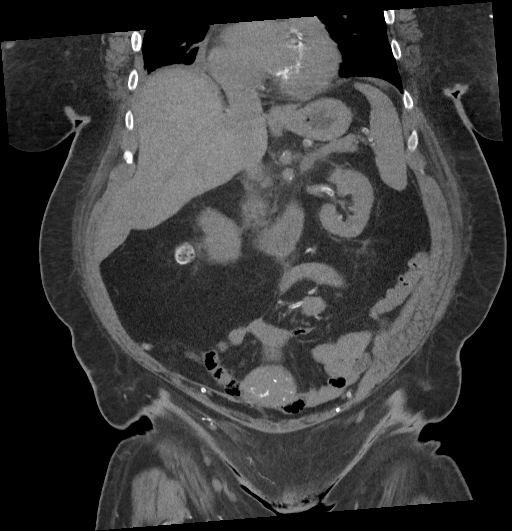
[im 69/124  soft-tissue]
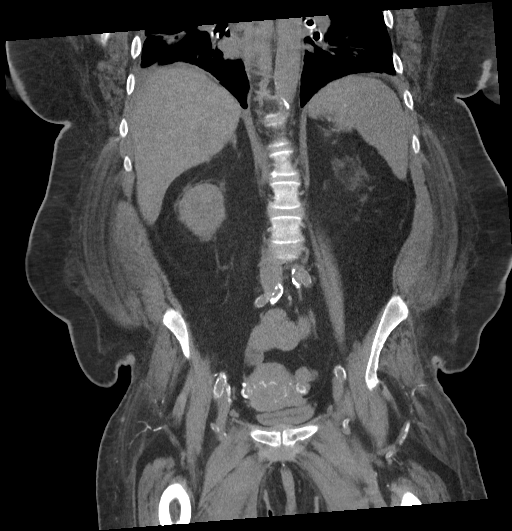

[16 of 46 positions shown; findings below may reference images not displayed]

FINDINGS: Lower chest: Right-sided pleural thickening noted. Rounded opacity
is seen in the posterior right lower lobe abutting the pleural
surface. These findings are new since prior exam in [W8]. Rounded
atelectasis is favored, however pulmonary infiltrate or neoplasm
cannot definitely be excluded.

Hepatobiliary: No mass visualized on this unenhanced exam.
Gallbladder is unremarkable. No evidence of biliary ductal
dilatation.

Pancreas: The pancreatic head is enlarged, with adjacent
peripancreatic soft tissue stranding. This is new since previous
study and is suspicious for focal pancreatitis, although pancreatic
carcinoma cannot definitely be excluded. No definite evidence of
pancreatic ductal dilatation on this unenhanced exam. No evidence of
peripancreatic fluid collections.

Spleen:  Within normal limits in size.

Adrenals/Urinary tract: No evidence of urolithiasis or
hydronephrosis. Unremarkable unopacified urinary bladder.

Stomach/Bowel: No evidence of obstruction, inflammatory process, or
abnormal fluid collections. Normal appendix visualized.

Vascular/Lymphatic: No pathologically enlarged lymph nodes
identified. No evidence of abdominal aortic aneurysm. Aortic
atherosclerotic calcification noted.

Reproductive:  No mass or other significant abnormality.

Other:  None.

Musculoskeletal:  No suspicious bone lesions identified.
IMPRESSION: Enlarged pancreatic head with adjacent peripancreatic soft tissue
stranding. This is suspicious for focal pancreatitis, although
pancreatic carcinoma cannot definitely be excluded. Recommend
correlation with serum lipase level, and continued follow-up by
pancreatic protocol MRI or CT without and with contrast.

Right-sided pleural thickening and rounded opacity in posterior
right lower lobe, new since [W8] exam. Rounded atelectasis is
favored, however pulmonary infiltrate or neoplasm cannot definitely
be excluded. Recommend clinical correlation, and short-term
follow-up with chest CT or PET-CT.

## 2021-04-21 MED ORDER — ONDANSETRON HCL 4 MG/2ML IJ SOLN
4.0000 mg | Freq: Four times a day (QID) | INTRAMUSCULAR | Status: DC | PRN
  Administered 2021-04-21 – 2021-04-30 (×12): 4 mg via INTRAVENOUS
  Filled 2021-04-21 (×12): qty 2

## 2021-04-21 MED ORDER — FENTANYL CITRATE PF 50 MCG/ML IJ SOSY
PREFILLED_SYRINGE | INTRAMUSCULAR | Status: AC
  Filled 2021-04-21: qty 1

## 2021-04-21 MED ORDER — HEPARIN SODIUM (PORCINE) 1000 UNIT/ML DIALYSIS
20.0000 [IU]/kg | INTRAMUSCULAR | Status: DC | PRN
  Filled 2021-04-21: qty 3

## 2021-04-21 MED ORDER — CHLORHEXIDINE GLUCONATE CLOTH 2 % EX PADS
6.0000 | MEDICATED_PAD | Freq: Every day | CUTANEOUS | Status: DC
  Administered 2021-04-22 – 2021-05-01 (×10): 6 via TOPICAL
  Filled 2021-04-21: qty 6

## 2021-04-21 MED ORDER — LIDOCAINE HCL (PF) 1 % IJ SOLN: 5.0000 mL | INTRAMUSCULAR | Status: DC | PRN

## 2021-04-21 MED ORDER — HYDROMORPHONE HCL 1 MG/ML IJ SOLN
0.5000 mg | INTRAMUSCULAR | Status: DC | PRN
  Administered 2021-04-21 – 2021-04-23 (×8): 0.5 mg via INTRAVENOUS
  Filled 2021-04-21 (×8): qty 1

## 2021-04-21 MED ORDER — PENTAFLUOROPROP-TETRAFLUOROETH EX AERO
1.0000 "application " | INHALATION_SPRAY | CUTANEOUS | Status: DC | PRN
  Filled 2021-04-21: qty 30

## 2021-04-21 MED ORDER — ONDANSETRON HCL 4 MG/2ML IJ SOLN
INTRAMUSCULAR | Status: AC
  Filled 2021-04-21: qty 2

## 2021-04-21 MED ORDER — FENTANYL CITRATE PF 50 MCG/ML IJ SOSY
25.0000 ug | PREFILLED_SYRINGE | INTRAMUSCULAR | Status: DC | PRN
  Administered 2021-04-21 (×2): 25 ug via INTRAVENOUS
  Filled 2021-04-21: qty 1

## 2021-04-21 MED ORDER — HYDROMORPHONE HCL 1 MG/ML IJ SOLN
1.0000 mg | Freq: Once | INTRAMUSCULAR | Status: AC
  Administered 2021-04-21: 1 mg via INTRAVENOUS
  Filled 2021-04-21: qty 1

## 2021-04-21 MED ORDER — LIDOCAINE-PRILOCAINE 2.5-2.5 % EX CREA: 1.0000 "application " | TOPICAL_CREAM | CUTANEOUS | Status: DC | PRN

## 2021-04-21 MED ORDER — ALTEPLASE 2 MG IJ SOLR
2.0000 mg | Freq: Once | INTRAMUSCULAR | Status: DC | PRN
  Filled 2021-04-21: qty 2

## 2021-04-21 MED ORDER — HEPARIN SODIUM (PORCINE) 1000 UNIT/ML DIALYSIS
1000.0000 [IU] | INTRAMUSCULAR | Status: DC | PRN
  Filled 2021-04-21: qty 1

## 2021-04-21 MED ORDER — ONDANSETRON HCL 4 MG/2ML IJ SOLN
4.0000 mg | Freq: Once | INTRAMUSCULAR | Status: AC
Start: 2021-04-21 — End: 2021-04-21
  Administered 2021-04-21: 4 mg via INTRAVENOUS
  Filled 2021-04-21: qty 2

## 2021-04-21 MED ORDER — SODIUM CHLORIDE 0.9 % IV SOLN: 100.0000 mL | INTRAVENOUS | Status: DC | PRN

## 2021-04-21 NOTE — Assessment & Plan Note (Addendum)
Patient presents with a 3 to 4-day history of epigastric abdominal pain with reports of radiation to the back unable to tolerate any significant p.o. intake.  Lipase elevated at 439.  Imaging significant for concern for focal pancreatitis.  Previous episode of pancreatitis noted in 05/2020, but thought possibly related to Ivy which appears to have been discontinued.  She does not drink alcohol.  -Admit to a medical telemetry bed -Triglycerides are 151 -Clear liquid diet and advance as tolerated -Antiemetics as needed -Fentanyl IV as needed for pain -Hold empirically giving IV fluids unless patient becomes hypotensive -Pt continues to complain of severe abdominal pain. -GI asssistance is appreciated

## 2021-04-21 NOTE — Assessment & Plan Note (Signed)
BMI 44.46 kg/m

## 2021-04-21 NOTE — ED Notes (Signed)
Pt states that she produces very little urine

## 2021-04-21 NOTE — Progress Notes (Signed)
Hemodialysis notes  Prior to treatment pt complained of pain on her abdomen and back. Pt said she already received pain meds from the ED.  17:30 HD treatment started.   17:56 patient was nauseas med given see MAR  18:11 patient still complaining of pain. Pain med given, see MAR  20:15 Pt complained of cramps. 100cc nSS given bp is also low. Bp readjusted and retaken still low at 81/59. Pt wants to be off the machine . DR Theador Hawthorne was notified and he agreed and pt should sign an AMA. Patient signed AMA for shortening treatment. Post HD BP 115/75.  Pt still have 40 minutes on the machine. Total UF removed is 1345ml. Total treatment time is 2 hours and 50 minutes. Pt is alert . Send back to ED

## 2021-04-21 NOTE — Assessment & Plan Note (Addendum)
Resolved after HD. Acute.  On admission CO2 19 and anion gap 22.  Glucose levels were within normal limits.  Suspect secondary to missed hemodialysis sessions. -Continue to monitor

## 2021-04-21 NOTE — ED Triage Notes (Signed)
Pt to ED via POV c/o abdominal pain and nausea. Pt states that she has had some vomiting and dry heaving as well. Pt is dialysis pt, pt states that she was at dialysis today but left early because she was feeling so bad. Pt states that she about 1 hour of her treatment. Pt is currently in NAD.

## 2021-04-21 NOTE — Assessment & Plan Note (Addendum)
Patient has a known history of a diabetic foot wound previously diagnosed with gangrene and associated osteomyelitis requiring fifth ray amputation of the foot 09/2020.  Patient appears to have been previously recommended  to have amputation of the foot but and declined.  Right foot toes appear to be chronic in appearance. -Check ESR and CRP -Check x-rays of her foot -Wound care consulted  -Reassess and determine need of antibiotics and/or further work-up and evaluate -X-ray of the foot was performed. It demonstrated a faint lucency in the head of the right fourth metatarsal. This may represent osteomyelitis. MRI has been ordered.

## 2021-04-21 NOTE — Assessment & Plan Note (Addendum)
Patient reports chronically having cough which is of breath wheezing.  On physical exam no significant wheezing appreciated at this time but she had been started on azithromycin due to reports of change in sputum. -Complete course of azithromycin -Continue scheduled home breathing treatments and albuterol nebs as needed for shortness of breath/wheezing

## 2021-04-21 NOTE — Assessment & Plan Note (Addendum)
[name] last full hemodialysis session was on Pt underwent dialysis today.

## 2021-04-21 NOTE — Progress Notes (Signed)
[name]  MRN: [id number]  DOB/AGE: 06/06/1970 51 y.o.  Primary Care Physician:[name], NP  Admit date: 04/21/2021  Chief Complaint:  Chief Complaint  Patient presents with   Abdominal Pain   Nausea    S-Pt presented on  04/21/2021 with  Chief Complaint  Patient presents with   Abdominal Pain   Nausea  . Patient is a 51 year old African-American female with a past medical history of end-stage renal disease, patient is on Tuesday Thursday Saturday schedule, diabetes mellitus, history of pancreatitis who came to the ER with chief complaint of abdominal pain.  History of present illness dated back stool positive and patient started having abdominal pain.  The pain is epigastric in location and radiates to the back. Patient also complains of decreased appetite and nausea associated with this pain Patient missed her dialysis on Thursday and today she did go for dialysis but could not tolerate it because of her abdominal pain. Upon evaluation in the ER patient had a CT scan done Patient CT scan does show pancreatitis patient was admitted for further treatment Nephrology was consulted for comanagement of dialysis patient. Patient was seen in the ER. Patient gave history  that she was having diarrhea earlier now she is better. Patient gave a history that since she got her Dilaudid the pain is better.    Y[facility] from the symptoms mentioned above,there are no other symptoms referable to all systems reviewed.  Physical Exam: Vital signs in last 24 hours: Temp:  [98 F (36.7 C)] 98 F (36.7 C) (02/25 0850) Pulse Rate:  [91] 91 (02/25 0850) Resp:  [16] 16 (02/25 0850) BP: (155)/(88) 155/88 (02/25 0850) SpO2:  [95 %] 95 % (02/25 0850) Weight:  [117.5 kg] 117.5 kg (02/25 0851) Weight change:     Intake/Output from previous day: No intake/output data recorded. No intake/output data recorded.   Physical Exam:  General- pt is awake,alert, oriented to time place  and person  Resp- No acute REsp distress, CTA B/L NO Rhonchi  CVS- S1S2 regular in rate and rhythm  GIT- BS+, soft, minimal tenderness  , Non distended  EXT- trace LE Edema,  No Cyanosis  Access-  LUE AVF  Lab Results:  CBC  Recent Labs    04/21/21 0853  WBC 9.3  HGB 13.5  HCT 44.5  PLT 294    BMET  Recent Labs    04/21/21 0853  NA 128*  K 5.6*  CL 87*  CO2 19*  GLUCOSE 125*  BUN 73*  CREATININE 10.95*  CALCIUM 8.8*      Most recent Creatinine trend  Lab Results  Component Value Date   CREATININE 10.95 (H) 04/21/2021   CREATININE 5.07 (H) 02/22/2021   CREATININE 5.04 (H) 11/21/2020      MICRO   No results found for this or any previous visit (from the past 240 hour(s)).   CT abdomen done on April 21, 2021. IMPRESSION: Enlarged pancreatic head with adjacent peripancreatic soft tissue stranding. This is suspicious for focal pancreatitis, although pancreatic carcinoma cannot definitely be excluded. Recommend correlation with serum lipase level, and continued follow-up by pancreatic protocol MRI or CT without and with contrast.   Right-sided pleural thickening and rounded opacity in posterior right lower lobe, new since 2016 exam. Rounded atelectasis is favored, however pulmonary infiltrate or neoplasm cannot definitely be excluded. Recommend clinical correlation, and short-term follow-up with chest CT or PET-CT.   Impression:    Patient is well-known to our practice as patient  undergoes dialysis at [facility] dialysis under care of Dr. Murlean Iba Patient is on Tuesday Thursday Saturday schedule Patient has a target dry weight of 119.5 kg  1)Renal   End-stage renal disease Patient is a hemodialysis Patient is on Tuesday Thursday Saturday schedule Patient missed her dialysis on Thursday only completed 1 hour today Patient will be dialyzed today  2)HTN    Patient blood pressures goal for the acute state Dialysis should  help   3)Anemia of chronic disease  CBC Latest Ref Rng & Units 04/21/2021 02/22/2021 01/13/2021  WBC 4.0 - 10.5 K/uL 9.3 6.1 5.0  Hemoglobin 12.0 - 15.0 g/dL 13.5 10.7(L) 9.7(L)  Hematocrit 36.0 - 46.0 % 44.5 35.3(L) 32.3(L)  Platelets 150 - 400 K/uL 294 341 249      Patient hemoglobin is more than 12 No need for Epogen Patient did receive Mircera as outpatient   4) Secondary hyperparathyroidism -CKD Mineral-Bone Disorder    Lab Results  Component Value Date   PTH 134 (H) 04/21/2017   CALCIUM 8.8 (L) 04/21/2021   CAION 0.81 (LL) 10/26/2020   PHOS 5.0 (H) 11/09/2020    Secondary Hyperparathyroidism  present  Phosphorus at goal.   5)Pancreatitis Patient is admitted with acute pancreatitis Primary team is following   6) Electrolytes   BMP Latest Ref Rng & Units 04/21/2021 02/22/2021 11/21/2020  Glucose 70 - 99 mg/dL 125(H) 180(H) 85  BUN 6 - 20 mg/dL 73(H) 24(H) 24(H)  Creatinine 0.44 - 1.00 mg/dL 10.95(H) 5.07(H) 5.04(H)  Sodium 135 - 145 mmol/L 128(L) 136 136  Potassium 3.5 - 5.1 mmol/L 5.6(H) 3.7 4.1  Chloride 98 - 111 mmol/L 87(L) 96(L) 96(L)  CO2 22 - 32 mmol/L 19(L) 29 28  Calcium 8.9 - 10.3 mg/dL 8.8(L) 8.6(L) 9.0     Sodium Hyponatremia Secondary to ESRD   Potassium Hyperkalemia Secondary to ESRD    7) Acute metabolic acidosis Most likely secondary to GI losses and ESRD        Plan:  We will dialyze patient today We will use lower K bath Will not give patient any ESA as patient hemoglobin is more than 12 Dialysis should also help with her acute metabolic acidosis       Renley Gutman s [facility] 04/21/2021, 12:21 PM

## 2021-04-21 NOTE — ED Notes (Signed)
Report to [facility], pt being moved in IP admission area.

## 2021-04-21 NOTE — ED Notes (Signed)
Report to [place], RN.

## 2021-04-21 NOTE — ED Notes (Signed)
Pt presents to ED with epigastric ABD pain that started yesterday. Pt states she is a dialysis pt and did end her session early yesterday. Pt states she does only producer "a tiny bit of urine". Pt denies fevers or chills. Pt is A&Ox4. Pt states some nauseous but denies vomiting and states "dry heaving'.

## 2021-04-21 NOTE — H&P (Addendum)
History and Physical    Patient: [name]:[id number] DOB: 14-Mar-1970 DOA: 04/21/2021 DOS: the patient was seen and examined on 04/21/2021 PCP: [name], NP  Patient coming from: Via private vehicle  Chief Complaint:  Chief Complaint  Patient presents with   Abdominal Pain   Nausea    HPI: [name] is a 51 y.o. female with medical history significant of HTN, COPD, chronic respiratory failure with hypoxia and hypercapnia on 3 L of oxygen at baseline, ESRD on HD(TTS),  DM type II, and gangrene with  associatedosteomyelitis status post fifth ray amputation of the right foot presents with complaints of 3-4 day of abdominal pain.  Describes it as a crampy on epigastric abdominal pain with associated symptoms of nausea and vomiting.  Initially she thought symptoms were possibly secondary to a stomach flu that has been going around and subsequently question if she was constipated.  She took stool softeners and had a bowel movement but symptoms did not improve.  Notes that pain would radiate to her back.  She chronically reports having intermittent chills, shortness of breath, wheezing, and productive cough.  However, she had noticed that her sputum had started to change colors.  Her nephrologist had placed her on Z-Pak 2 days ago but she was only able to keep the first dose down.  She had missed hemodialysis on Thursday, and today when she went only was able to tolerate about a hour. She does not drink alcohol.  Patient reported symptoms felt similar, but worse than prior bout of pancreatitis and she did not require hospitalization.  That was thought secondary to one of her medications like Ozempic.  On admission to the emergency department patient was noted to be afebrile with blood pressures elevated up to 155/88, and all other vital signs maintained.  Labs significant for sodium 128, potassium 5.6 CO2 19, BUN 73, creatinine 10.95, anion gap 22, alkaline phosphatase 357, lipase  439, and total protein 8.9. CT scan of the abdomen pelvis significant for concern for focal pancreatitis along with right-sided pleural thickening and rounded opacity noted in the posterior right lower lobe favoring atelectasis.  Review of Systems: As mentioned in the history of present illness. All other systems reviewed and are negative. Past Medical History:  Diagnosis Date   (HFpEF) heart failure with preserved ejection fraction ([place])    a. 11/2014 Echo: EF 50-55%, Gr1 DD; b. 06/2016 Echo: EF 60-65%, no rwma, mod dil LA, nl RV, triv eff.   Anxiety    Asthma    Bronchitis    CHF (congestive heart failure) (HCC)    Chronic cough    Chronic respiratory failure with hypoxia and hypercapnia ([place])    a. Home O2.  (2-4L)   CKD (chronic kidney disease), stage III (HCC)    Depression    Diabetes mellitus without complication (HCC)    Diverticulosis    Dyspnea    Dysrhythmia    per pt, no arrythmias   Environmental and seasonal allergies    Extreme obesity with alveolar hypoventilation (HCC)    Hypertension    Iron deficiency anemia    a. chronic blood loss->heavy menses.   Kidney failure    dialysis tues/thur/sat   ... left arm shunt   Orthopnea    Oxygen dependent    3l/min   Pericardial effusion    a. 11/2014 CT Chest: Mod effusion; b. 11/2014 Echo: small to mod circumferential effusion. No hemodynamic compromise.   Pneumonia  history   Poorly controlled diabetes mellitus ([place])    a. 11/2014 A1c 13.2.   Swelling of both lower extremities    Past Surgical History:  Procedure Laterality Date   A/V FISTULAGRAM Left 06/09/2017   Procedure: A/V FISTULAGRAM;  Surgeon: Algernon Huxley, MD;  Location: [facility];  Service: Cardiovascular;  Laterality: Left;   A/V FISTULAGRAM Left 06/16/2017   Procedure: A/V FISTULAGRAM;  Surgeon: Algernon Huxley, MD;  Location: [facility];  Service: Cardiovascular;  Laterality: Left;   A/V FISTULAGRAM Left 01/28/2018   Procedure: A/V  FISTULAGRAM;  Surgeon: Algernon Huxley, MD;  Location: [facility];  Service: Cardiovascular;  Laterality: Left;   AMPUTATION TOE Right 10/21/2020   Procedure: AMPUTATION TOE-5th Digit Amputation Right Foot;  Surgeon: Edrick Kins, DPM;  Location: [facility];  Service: Podiatry;  Laterality: Right;   AV FISTULA PLACEMENT Left 04/25/2017   Procedure: ARTERIOVENOUS (AV) FISTULA CREATION;  Surgeon: Algernon Huxley, MD;  Location: [facility];  Service: Vascular;  Laterality: Left;   CATARACT EXTRACTION W/PHACO Left 01/21/2018   Procedure: CATARACT EXTRACTION PHACO AND INTRAOCULAR LENS PLACEMENT (IOC);  Surgeon: Leandrew Koyanagi, MD;  Location: [facility];  Service: Ophthalmology;  Laterality: Left;  [place] 01:55.5 CDE 9.05 EAUP 13.9 Fluid Pack Lot # [id number]   CESAREAN SECTION     times 3   DIALYSIS/PERMA CATHETER INSERTION Right 04/04/2017   Procedure: DIALYSIS/PERMA CATHETER INSERTION;  Surgeon: Katha Cabal, MD;  Location: [facility];  Service: Cardiovascular;  Laterality: Right;   DIALYSIS/PERMA CATHETER REMOVAL N/A 09/17/2017   Procedure: DIALYSIS/PERMA CATHETER REMOVAL;  Surgeon: Algernon Huxley, MD;  Location: [facility];  Service: Cardiovascular;  Laterality: N/A;   INCISION AND DRAINAGE Right 10/26/2020   Procedure: INCISION AND DRAINAGE;  Surgeon: [name], DPM;  Location: [facility];  Service: Podiatry;  Laterality: Right;   INSERTION OF DIALYSIS CATHETER N/A 04/16/2017   Procedure: INSERTION OF DIALYSIS PERM CATHETER;  Surgeon: Algernon Huxley, MD;  Location: [facility];  Service: Vascular;  Laterality: N/A;   LOWER EXTREMITY ANGIOGRAPHY Right 10/18/2020   Procedure: Lower Extremity Angiography;  Surgeon: Algernon Huxley, MD;  Location: [facility];  Service: Cardiovascular;  Laterality: Right;   Social History:  reports that she has never smoked. She has never used smokeless tobacco. She reports that she does not currently use alcohol. She reports current drug use.  Drug: Marijuana.  Allergies  Allergen Reactions   Dust Mite Extract Shortness Of Breath and Itching   Mold Extract [Trichophyton] Shortness Of Breath and Itching   Pollen Extract Shortness Of Breath    Itching, shortness of breath, asthma like symptoms   Sulfa Antibiotics Itching   Feraheme [Ferumoxytol] Itching    Uses benadryl so she can get the medicine    Family History  Problem Relation Age of Onset   Diabetes Mother    Hypertension Mother    Hypertension Father    Diabetes Father    Hypertension Other    Diabetes Other    Breast cancer Maternal Aunt 101   Breast cancer Maternal Aunt 50    Prior to Admission medications   Medication Sig Start Date End Date Taking? Authorizing Provider  acetaminophen (TYLENOL) 500 MG tablet Take 1,000 mg by mouth every 6 (six) hours as needed for mild pain, fever or headache.    [provider]  albuterol (PROVENTIL) (2.5 MG/3ML) 0.083% nebulizer solution Take 3 mLs (  2.5 mg total) by nebulization every 6 (six) hours as needed for wheezing or shortness of breath. 02/28/21   Tyler Pita, MD  albuterol (VENTOLIN HFA) 108 (90 Base) MCG/ACT inhaler INHALE 2 PUFFS BY MOUTH EVERY 4 HOURS AS NEEDED FOR WHEEZING FOR SHORTNESS OF BREATH 04/17/21   Tyler Pita, MD  aspirin EC 81 MG tablet Take 81 mg by mouth daily.     [provider]  b complex-vitamin c-folic acid (NEPHRO-VITE) 0.8 MG TABS tablet Take 1 tablet by mouth at bedtime.    [provider]  budesonide (PULMICORT) 0.25 MG/2ML nebulizer solution Take 2 mLs (0.25 mg total) by nebulization 2 (two) times daily. 01/24/21   Tyler Pita, MD  Cholecalciferol 100 MCG (4000 UT) CAPS Take 1 capsule by mouth once a week.    [provider]  clindamycin (CLEOCIN T) 1 % lotion Apply 1 application topically daily. 09/19/20   [provider]  docusate sodium (COLACE) 100 MG capsule Take 100 mg by mouth daily.    [provider]  ferric  citrate (AURYXIA) 1 GM 210 MG(Fe) tablet Take 210 mg by mouth 3 (three) times daily with meals.    [provider]  formoterol (PERFOROMIST) 20 MCG/2ML nebulizer solution  01/19/21   [provider]  insulin aspart (NOVOLOG) 100 UNIT/ML injection Inject 0-5 Units into the skin at bedtime. 04/26/17   Epifanio Lesches, MD  insulin aspart (NOVOLOG) 100 UNIT/ML injection Inject 0-15 Units into the skin 3 (three) times daily with meals. 04/26/17   Epifanio Lesches, MD  insulin degludec (TRESIBA) 100 UNIT/ML FlexTouch Pen Inject 43 Units into the skin daily at 10 pm. 11/08/20   Kathie Dike, MD  lactulose (CHRONULAC) 10 GM/15ML solution Take 30 mLs (20 g total) by mouth daily as needed for severe constipation. 04/26/17   Epifanio Lesches, MD  levocetirizine (XYZAL) 5 MG tablet Take 5 mg by mouth at bedtime. 05/25/20   [provider]  lidocaine-prilocaine (EMLA) cream Apply topically as needed (For access for dialysis).    [provider]  LINZESS 290 MCG CAPS capsule Take 290 mcg by mouth every morning. 10/10/20   [provider]  metoprolol tartrate (LOPRESSOR) 25 MG tablet Take 1 tablet (25 mg total) by mouth 2 (two) times daily. 11/10/20   Kathie Dike, MD  midodrine (PROAMATINE) 10 MG tablet Take 20 mg by mouth daily as needed. 08/22/20   [provider]  Omega-3 Fatty Acids ([employer] NATURAL FISH OIL) 1000 MG CAPS 2 mL 2 TIMES DAILY (route: inhalation) 12/07/20   [provider]  omeprazole (PRILOSEC) 20 MG capsule Take 20 mg by mouth daily. 08/06/20   [provider]  oxyCODONE-acetaminophen (PERCOCET) 5-325 MG tablet Take 1 tablet by mouth every 4 (four) hours as needed for severe pain. 11/08/20   Kathie Dike, MD  OXYGEN Inhale 2-4 L into the lungs.    [provider]  revefenacin Maretta Bees) 175 MCG/3ML nebulizer solution  01/19/21   [provider]    Physical Exam: Vitals:   04/21/21 0850 04/21/21  0851 04/21/21 1217  BP: (!) 155/88  (!) 154/89  Pulse: 91  80  Resp: 16  17  Temp: 98 F (36.7 C)    TempSrc: Oral    SpO2: 95%  97%  Weight:  117.5 kg   Height:  _0  (1.626 m)     Constitutional: Morbidly obese female appears to be in some discomfort Eyes: PERRL, lids and conjunctivae normal ENMT:  Mucous membranes are moist. Posterior pharynx clear of any exudate or lesions.   Neck: normal, supple, no masses, no thyromegaly Respiratory: Decreased overall aeration with no significant wheezes or rhonchi appreciated O2 saturations currently maintained on 3 L of nasal cannula oxygen for Cardiovascular: Regular rate and rhythm, no murmurs / rubs / gallops.  Left upper extremity fistula with palpable wound. Abdomen: no tenderness, no masses palpated. No hepatosplenomegaly. Bowel sounds positive.  Musculoskeletal: Ray amputation of the right fifth toe. Skin: Deep ulcer noted of the medial aspect of the right mid foot and smaller less deep ulcer noted on the lateral aspect of the right foot.  No significant signs of erythema, but necrotic appearance of the toes      Neurologic: CN 2-12 grossly intact.  Able to move all extremities. Psychiatric: Normal judgment and insight. Alert and oriented x 3. Normal mood.    Data Reviewed:  EKG significant for sinus rhythm at 92 bpm with T wave changes   Assessment and Plan: * Pancreatitis- (present on admission) Patient presents with a 3 to 4-day history of epigastric abdominal pain with reports of radiation to the back unable to tolerate any significant p.o. intake.  Lipase elevated at 439.  Imaging significant for concern for focal pancreatitis.  Previous episode of pancreatitis noted in 05/2020, but thought possibly related to [place] which appears to have been discontinued.  She does not drink alcohol.  -Admit to a medical telemetry bed -Check triglyceride level -Clear liquid diet and advance as tolerated -Antiemetics as needed -Fentanyl  IV as needed for pain -Hold empirically giving IV fluids unless patient becomes hypotensive -Would likely benefit from a GI follow-up/referral in the outpatient setting   Diabetic foot ulcer ([place])- (present on admission) Patient has a known history of a diabetic foot wound previously diagnosed with gangrene and associated osteomyelitis requiring fifth ray amputation of the foot 09/2020.  Patient appears to have been previously recommended  to have amputation of the foot but and declined.  Right foot toes appear to be chronic in appearance. -Check ESR and CRP -Check x-rays of her foot -Wound care consulted  -Reassess and determine need of antibiotics and/or further work-up and evaluate   End-stage renal disease on hemodialysis ([place]) [name] last full hemodialysis session was on 2/21.  Labs significant for potassium 5.6, BUN 73, creatinine 10.95.  Nephrology consulted and  will try to have patient dialyzed today. -HD per nephrology  Metabolic acidosis, increased anion gap Acute.  On admission CO2 19 and anion gap 22.  Glucose levels were within normal limits.  Suspect secondary to missed hemodialysis sessions. -Continue to monitor  COPD with chronic bronchitis ([place])- (present on admission) Patient reports chronically having cough which is of breath wheezing.  On physical exam no significant wheezing appreciated at this time but she had been started on azithromycin due to reports of change in sputum. -Complete course of azithromycin -Continue scheduled home breathing treatments and albuterol nebs as needed for shortness of breath/wheezing   Type 2 diabetes mellitus with ESRD (end-stage renal disease) ([place])- (present on admission) Admission into the emergency department patient blood glucose was noted to be 125.  Last hemoglobin A1c 8.9 in 09/2020.  On insulin regimen includes NovoLog insulin 0 to 15 units with meals and Tresiba 43 units nightly. -Hypoglycemic protocol  -Reduced pharmacy  substitution of Tresiba to 23 units nightly -CBGs before every meal with moderate SSI -Adjust insulin regimen as needed   GERD (gastroesophageal reflux disease)- (present on admission) -Substitution  of Protonix 40 mg IV for omeprazole  Hyponatremia- (present on admission) Acute on chronic.  Sodium 128 on admission which appears acutely, but similar seen in the past.  Possibly related with reports of nausea and vomiting versus patient being fluid overloaded from missed dialysis. -Will recheck labs in a.m. given recent missed hemodialysis sessions and avoid possibity of fluid overload giving IV fluids   Chronic respiratory failure with hypoxia (HCC)- (present on admission) Chronic respiratory failure on 3 L nasal cannula oxygen 24/7 with O2 saturations currently maintained.  Patient had recently had a sleep study done in the outpatient setting, but has not heard back regarding results.  CT imaging did note concern for right-sided pleural effusion. -Continue to monitor O2 saturations -Continue nasal cannula oxygen 3 L and adjust as needed -Follow-up chest x-ray -Fluid management with dialysis -Follow-up with pulmonology in outpatient setting regarding sleep study   Obesity, Class III, BMI 40-49.9 (morbid obesity) ([place])- (present on admission) BMI 44.46 kg/m       Advance Care Planning:   Code Status: Full Code    Consults: Nephrology  Family Communication: None requested  Severity of Illness: The appropriate patient status for this patient is INPATIENT. Inpatient status is judged to be reasonable and necessary in order to provide the required intensity of service to ensure the patient's safety. The patient's presenting symptoms, physical exam findings, and initial radiographic and laboratory data in the context of their chronic comorbidities is felt to place them at high risk for further clinical deterioration. Furthermore, it is not anticipated that the patient will be medically  stable for discharge from the hospital within 2 midnights of admission.   * I certify that at the point of admission it is my clinical judgment that the patient will require inpatient hospital care spanning beyond 2 midnights from the point of admission due to high intensity of service, high risk for further deterioration and high frequency of surveillance required.*  Author: Norval Morton, MD 04/21/2021 1:10 PM  For on call review [email or web address].

## 2021-04-21 NOTE — Assessment & Plan Note (Addendum)
Admission into the emergency department patient blood glucose was noted to be 125.  Last hemoglobin A1c 8.9 in 09/2020.  On insulin regimen includes NovoLog insulin 0 to 15 units with meals and Tresiba 43 units nightly. -Hypoglycemic protocol  -Reduced pharmacy substitution of Tresiba to 23 units nightly -CBGs before every meal with moderate SSI -Adjust insulin regimen as needed Glucoses have been 105-198 in the last 24 hours.

## 2021-04-21 NOTE — Assessment & Plan Note (Signed)
-  Substitution of Protonix 40 mg IV for omeprazole

## 2021-04-21 NOTE — ED Notes (Signed)
PT to Dialysis at this time.

## 2021-04-21 NOTE — Assessment & Plan Note (Addendum)
Acute on chronic. Resolved. Sodium 130. Monitor.

## 2021-04-21 NOTE — ED Provider Notes (Signed)
[facility] Provider Note    Event Date/Time   First MD Initiated Contact with Patient 04/21/21 1102     (approximate)   History   Abdominal Pain and Nausea   HPI  [name] is a 51 y.o. female with history of heart failure, COPD, end-stage renal disease, prior pancreatitis, diabetes, here with abdominal pain.  The patient states that for the last several days, she has had progressive worsening aching, throbbing, gnawing, epigastric pain.  This is primarily in the front but occasionally radiates to the back.  She had associated nausea and difficulty eating and drinking.  She missed her dialysis session on Thursday due to this, and could only sit for about an hour today, before coming here.  Pain is worse with any attempted eating.  It does feel somewhat similar to her prior episode of pancreatitis, which was thought due to medications.  No recent medication changes.  No fevers.  Denies alcohol use.  No shortness of breath.     Physical Exam   Triage Vital Signs: ED Triage Vitals  Enc Vitals Group     BP 04/21/21 0850 (!) 155/88     Pulse Rate 04/21/21 0850 91     Resp 04/21/21 0850 16     Temp 04/21/21 0850 98 F (36.7 C)     Temp Source 04/21/21 0850 Oral     SpO2 04/21/21 0850 95 %     Weight 04/21/21 0851 259 lb (117.5 kg)     Height 04/21/21 0851 5\' 4"  (1.626 m)     Head Circumference --      Peak Flow --      Pain Score 04/21/21 0851 8     Pain Loc --      Pain Edu? --      Excl. in [place]? --     Most recent vital signs: Vitals:   04/21/21 0850 04/21/21 1217  BP: (!) 155/88 (!) 154/89  Pulse: 91 80  Resp: 16 17  Temp: 98 F (36.7 C)   SpO2: 95% 97%     General: Awake, no distress.  CV:  Good peripheral perfusion.  Resp:  Normal effort.  Lungs clear bilaterally.  No rales. Abd:  No distention.  Moderate diffuse tenderness, particularly in the epigastric area.  No rebound or guarding. Other:  No significant edema.  Moist mucous  membranes.   ED Results / Procedures / Treatments   Labs (all labs ordered are listed, but only abnormal results are displayed) Labs Reviewed  LIPASE, BLOOD - Abnormal; Notable for the following components:      Result Value   Lipase 439 (*)    All other components within normal limits  COMPREHENSIVE METABOLIC PANEL - Abnormal; Notable for the following components:   Sodium 128 (*)    Potassium 5.6 (*)    Chloride 87 (*)    CO2 19 (*)    Glucose, Bld 125 (*)    BUN 73 (*)    Creatinine, Ser 10.95 (*)    Calcium 8.8 (*)    Total Protein 8.9 (*)    Alkaline Phosphatase 357 (*)    GFR, Estimated 4 (*)    Anion gap 22 (*)    All other components within normal limits  TRIGLYCERIDES - Abnormal; Notable for the following components:   Triglycerides 151 (*)    All other components within normal limits  RESP PANEL BY RT-PCR (FLU A&B, COVID) ARPGX2  CBC  HCG, QUANTITATIVE,  PREGNANCY  URINALYSIS, ROUTINE W REFLEX MICROSCOPIC  SEDIMENTATION RATE  C-REACTIVE PROTEIN  POC URINE PREG, ED     EKG Normal sinus rhythm, ventricular rate 92.  PR 132, QRS 70, QTc 47.  No acute ST elevations or depressions.  No EKG evidence of acute ischemic infarct.   RADIOLOGY Ultrasound right upper quadrant: Negative CT abdomen/pelvis:Enlarged pancreatic head with focal pancreatitis, ? Pulmonary infiltrate   I also independently reviewed and agree wit radiologist interpretations.   PROCEDURES:  Critical Care performed: No  .1-3 Lead EKG Interpretation Performed by: Duffy Bruce, MD Authorized by: Duffy Bruce, MD     Interpretation: normal     ECG rate:  70-90   ECG rate assessment: normal     Rhythm: sinus rhythm     Ectopy: none     Conduction: normal   Comments:     Indication: abd pain, n/v    MEDICATIONS ORDERED IN ED: Medications  Chlorhexidine Gluconate Cloth 2 % PADS 6 each (has no administration in time range)  ondansetron (ZOFRAN) injection 4 mg (has no  administration in time range)  fentaNYL (SUBLIMAZE) injection 25 mcg (has no administration in time range)  HYDROmorphone (DILAUDID) injection 1 mg (1 mg Intravenous Given 04/21/21 1151)  ondansetron (ZOFRAN) injection 4 mg (4 mg Intravenous Given 04/21/21 1151)     IMPRESSION / MDM / ASSESSMENT AND PLAN / ED COURSE  I reviewed the triage vital signs and the nursing notes.                               The patient is on the cardiac monitor to evaluate for evidence of arrhythmia and/or significant heart rate changes.   MDM:  51 year old female with past medical history of end-stage renal disease, hypertension, morbid obesity, here with abdominal pain, nausea, vomiting.  Lab work reviewed as above, patient noted to have significant lipase elevation consistent with acute pancreatitis.  Lab work shows ESRD with mild hypokalemia, no EKG changes.  She has not had a full session in the last 2 sessions.  CBC without leukocytosis or evidence to suggest infection.  Vital signs are overall stable.  Ultrasound of the right upper quadrant shows no acute acute abnormality.  CT scan shows focal pancreatitis with no complications.  Patient with persistent nausea, vomiting, and pain.  Will plan to admit for symptomatic control and further management.  I did discuss with nephrology as well given that she had missed her treatments.  Admit to hospitalist service.  Patient stable, but clinical course complicated by her ESRD status, will need cautious fluid management.   MEDICATIONS GIVEN IN ED: Medications  Chlorhexidine Gluconate Cloth 2 % PADS 6 each (has no administration in time range)  ondansetron (ZOFRAN) injection 4 mg (has no administration in time range)  fentaNYL (SUBLIMAZE) injection 25 mcg (has no administration in time range)  HYDROmorphone (DILAUDID) injection 1 mg (1 mg Intravenous Given 04/21/21 1151)  ondansetron (ZOFRAN) injection 4 mg (4 mg Intravenous Given 04/21/21 1151)     Consults:  Dr.  Theador Hawthorne of Nephrology consulted via telephone Hospitalist consult for admission   EMR reviewed  Pulmonology notes with Dr. Patsey Berthold on January/2023 for chronic pleural effusion Infectious disease notes on 01/2021 for chronic dry gangrene     FINAL CLINICAL IMPRESSION(S) / ED DIAGNOSES   Final diagnoses:  RUQ pain  Acute pancreatitis, unspecified complication status, unspecified pancreatitis type  ESRD (end stage renal disease) ([place])  Rx / DC Orders   ED Discharge Orders     None        Note:  This document was prepared using Dragon voice recognition software and may include unintentional dictation errors.   Duffy Bruce, MD 04/21/21 [phone]

## 2021-04-21 NOTE — Assessment & Plan Note (Addendum)
Chronic respiratory failure on 3 L nasal cannula oxygen 24/7 with O2 saturations currently maintained.  Patient had recently had a sleep study done in the outpatient setting, but has not heard back regarding results.  CT imaging did note concern for right-sided pleural effusion. -Continue to monitor O2 saturations -Continue nasal cannula oxygen 3 L and adjust as needed -Patient is currently at baseline O2 requirements at 3L by [place] to maintain saturation of 92% -Follow-up chest x-ray -Fluid management with dialysis -Follow-up with pulmonology in outpatient setting regarding sleep study

## 2021-04-21 NOTE — ED Notes (Signed)
US at bedside

## 2021-04-21 NOTE — Progress Notes (Signed)
[place] Patient reports fentanyl ineffective for relieving her pain.  She requests hydromorphone and 0.5 mg every 3 hours ordered

## 2021-04-22 ENCOUNTER — Encounter: Payer: Self-pay | Admitting: Internal Medicine

## 2021-04-22 DIAGNOSIS — J9611 Chronic respiratory failure with hypoxia: Secondary | ICD-10-CM | POA: Diagnosis not present

## 2021-04-22 DIAGNOSIS — K859 Acute pancreatitis without necrosis or infection, unspecified: Secondary | ICD-10-CM | POA: Diagnosis not present

## 2021-04-22 DIAGNOSIS — J449 Chronic obstructive pulmonary disease, unspecified: Secondary | ICD-10-CM | POA: Diagnosis not present

## 2021-04-22 DIAGNOSIS — E11621 Type 2 diabetes mellitus with foot ulcer: Secondary | ICD-10-CM | POA: Diagnosis not present

## 2021-04-22 LAB — RENAL FUNCTION PANEL
Albumin: 3.8 g/dL (ref 3.5–5.0)
Anion gap: 14 (ref 5–15)
BUN: 47 mg/dL — ABNORMAL HIGH (ref 6–20)
CO2: 26 mmol/L (ref 22–32)
Calcium: 8.6 mg/dL — ABNORMAL LOW (ref 8.9–10.3)
Chloride: 90 mmol/L — ABNORMAL LOW (ref 98–111)
Creatinine, Ser: 8.61 mg/dL — ABNORMAL HIGH (ref 0.44–1.00)
GFR, Estimated: 5 mL/min — ABNORMAL LOW (ref >60)
Glucose, Bld: 107 mg/dL — ABNORMAL HIGH (ref 70–99)
Phosphorus: 8 mg/dL — ABNORMAL HIGH (ref 2.5–4.6)
Potassium: 4.5 mmol/L (ref 3.5–5.1)
Sodium: 130 mmol/L — ABNORMAL LOW (ref 135–145)

## 2021-04-22 LAB — CBC
HCT: 40.4 % (ref 36.0–46.0)
Hemoglobin: 12.2 g/dL (ref 12.0–15.0)
MCH: 28.3 pg (ref 26.0–34.0)
MCHC: 30.2 g/dL (ref 30.0–36.0)
MCV: 93.7 fL (ref 80.0–100.0)
Platelets: 301 10*3/uL (ref 150–400)
RBC: 4.31 MIL/uL (ref 3.87–5.11)
RDW: 15.2 % (ref 11.5–15.5)
WBC: 8.9 10*3/uL (ref 4.0–10.5)
nRBC: 0 % (ref 0.0–0.2)

## 2021-04-22 LAB — GLUCOSE, CAPILLARY
Glucose-Capillary: 93 mg/dL (ref 70–99)
Glucose-Capillary: 88 mg/dL (ref 70–99)
Glucose-Capillary: 110 mg/dL — ABNORMAL HIGH (ref 70–99)
Glucose-Capillary: 111 mg/dL — ABNORMAL HIGH (ref 70–99)

## 2021-04-22 LAB — HEMOGLOBIN A1C
Hgb A1c MFr Bld: 6.2 % — ABNORMAL HIGH (ref 4.8–5.6)
Mean Plasma Glucose: 131.24 mg/dL

## 2021-04-22 LAB — CBG MONITORING, ED
Glucose-Capillary: 70 mg/dL (ref 70–99)
Glucose-Capillary: 107 mg/dL — ABNORMAL HIGH (ref 70–99)

## 2021-04-22 LAB — C-REACTIVE PROTEIN: CRP: 4.7 mg/dL — ABNORMAL HIGH (ref <1.0)

## 2021-04-22 MED ORDER — RENA-VITE PO TABS
1.0000 | ORAL_TABLET | Freq: Every day | ORAL | Status: DC
  Administered 2021-04-25 – 2021-04-30 (×6): 1 via ORAL
  Filled 2021-04-22 (×11): qty 1

## 2021-04-22 MED ORDER — CITALOPRAM HYDROBROMIDE 20 MG PO TABS
20.0000 mg | ORAL_TABLET | Freq: Every day | ORAL | Status: DC
  Filled 2021-04-22 (×5): qty 1

## 2021-04-22 MED ORDER — HYDROMORPHONE HCL 1 MG/ML IJ SOLN
0.5000 mg | Freq: Once | INTRAMUSCULAR | Status: AC
  Administered 2021-04-22: 0.5 mg via INTRAVENOUS
  Filled 2021-04-22: qty 1

## 2021-04-22 MED ORDER — FERRIC CITRATE 1 GM 210 MG(FE) PO TABS
210.0000 mg | ORAL_TABLET | Freq: Three times a day (TID) | ORAL | Status: DC
  Administered 2021-04-22 – 2021-05-01 (×14): 210 mg via ORAL
  Filled 2021-04-22 (×31): qty 1

## 2021-04-22 MED ORDER — INSULIN GLARGINE-YFGN 100 UNIT/ML ~~LOC~~ SOLN
23.0000 [IU] | Freq: Every day | SUBCUTANEOUS | Status: DC
  Filled 2021-04-22 (×2): qty 0.23

## 2021-04-22 MED ORDER — REVEFENACIN 175 MCG/3ML IN SOLN
175.0000 ug | Freq: Every day | RESPIRATORY_TRACT | Status: DC
  Administered 2021-04-22 – 2021-05-01 (×10): 175 ug via RESPIRATORY_TRACT
  Filled 2021-04-22 (×10): qty 3

## 2021-04-22 MED ORDER — MIDODRINE HCL 5 MG PO TABS
20.0000 mg | ORAL_TABLET | Freq: Every day | ORAL | Status: DC | PRN
  Administered 2021-04-24 – 2021-04-28 (×2): 20 mg via ORAL
  Filled 2021-04-22: qty 4

## 2021-04-22 MED ORDER — ACETAMINOPHEN 650 MG RE SUPP: 650.0000 mg | Freq: Four times a day (QID) | RECTAL | Status: DC | PRN

## 2021-04-22 MED ORDER — PANTOPRAZOLE SODIUM 40 MG IV SOLR
40.0000 mg | INTRAVENOUS | Status: DC
  Administered 2021-04-22 – 2021-04-23 (×2): 40 mg via INTRAVENOUS
  Filled 2021-04-22 (×2): qty 10

## 2021-04-22 MED ORDER — INSULIN ASPART 100 UNIT/ML IJ SOLN
0.0000 [IU] | Freq: Three times a day (TID) | INTRAMUSCULAR | Status: DC
  Administered 2021-04-23 – 2021-04-24 (×2): 3 [IU] via SUBCUTANEOUS
  Administered 2021-04-24: 2 [IU] via SUBCUTANEOUS
  Administered 2021-04-25: 13:00:00 5 [IU] via SUBCUTANEOUS
  Administered 2021-04-25: 16:00:00 3 [IU] via SUBCUTANEOUS
  Administered 2021-04-26: 11 [IU] via SUBCUTANEOUS
  Administered 2021-04-26: 2 [IU] via SUBCUTANEOUS
  Administered 2021-04-26 – 2021-04-27 (×2): 5 [IU] via SUBCUTANEOUS
  Administered 2021-04-27: 2 [IU] via SUBCUTANEOUS
  Administered 2021-04-28: 15 [IU] via SUBCUTANEOUS
  Administered 2021-04-28 – 2021-04-29 (×3): 5 [IU] via SUBCUTANEOUS
  Administered 2021-04-29: 8 [IU] via SUBCUTANEOUS
  Administered 2021-04-30: 5 [IU] via SUBCUTANEOUS
  Administered 2021-04-30 (×2): 8 [IU] via SUBCUTANEOUS
  Administered 2021-05-01: 5 [IU] via SUBCUTANEOUS
  Filled 2021-04-22 (×19): qty 1

## 2021-04-22 MED ORDER — ACETAMINOPHEN 325 MG PO TABS
650.0000 mg | ORAL_TABLET | Freq: Four times a day (QID) | ORAL | Status: DC | PRN
  Administered 2021-04-22 – 2021-05-01 (×5): 650 mg via ORAL
  Filled 2021-04-22 (×6): qty 2

## 2021-04-22 MED ORDER — ARFORMOTEROL TARTRATE 15 MCG/2ML IN NEBU
15.0000 ug | INHALATION_SOLUTION | Freq: Two times a day (BID) | RESPIRATORY_TRACT | Status: DC
  Administered 2021-04-22 – 2021-05-01 (×19): 15 ug via RESPIRATORY_TRACT
  Filled 2021-04-22 (×20): qty 2

## 2021-04-22 MED ORDER — INSULIN GLARGINE-YFGN 100 UNIT/ML ~~LOC~~ SOLN
18.0000 [IU] | Freq: Every day | SUBCUTANEOUS | Status: DC
  Administered 2021-04-24 – 2021-04-30 (×7): 18 [IU] via SUBCUTANEOUS
  Filled 2021-04-22 (×10): qty 0.18

## 2021-04-22 MED ORDER — HEPARIN SODIUM (PORCINE) 5000 UNIT/ML IJ SOLN
5000.0000 [IU] | Freq: Three times a day (TID) | INTRAMUSCULAR | Status: DC
  Administered 2021-04-22 – 2021-05-01 (×28): 5000 [IU] via SUBCUTANEOUS
  Filled 2021-04-22 (×28): qty 1

## 2021-04-22 MED ORDER — LACTULOSE 10 GM/15ML PO SOLN
20.0000 g | Freq: Every day | ORAL | Status: DC | PRN
  Administered 2021-04-27 – 2021-04-29 (×2): 20 g via ORAL
  Filled 2021-04-22 (×2): qty 30

## 2021-04-22 MED ORDER — SODIUM CHLORIDE 0.9% FLUSH
3.0000 mL | Freq: Two times a day (BID) | INTRAVENOUS | Status: DC
  Administered 2021-04-22 – 2021-05-01 (×20): 3 mL via INTRAVENOUS

## 2021-04-22 MED ORDER — ALBUTEROL SULFATE (2.5 MG/3ML) 0.083% IN NEBU
2.5000 mg | INHALATION_SOLUTION | Freq: Four times a day (QID) | RESPIRATORY_TRACT | Status: DC | PRN
  Administered 2021-04-24 – 2021-04-28 (×3): 2.5 mg via RESPIRATORY_TRACT
  Filled 2021-04-22 (×5): qty 3

## 2021-04-22 MED ORDER — LIDOCAINE-PRILOCAINE 2.5-2.5 % EX CREA
TOPICAL_CREAM | CUTANEOUS | Status: DC | PRN
  Filled 2021-04-22: qty 5

## 2021-04-22 MED ORDER — LORATADINE 10 MG PO TABS
10.0000 mg | ORAL_TABLET | Freq: Every day | ORAL | Status: DC
  Administered 2021-04-22 – 2021-04-30 (×10): 10 mg via ORAL
  Filled 2021-04-22 (×10): qty 1

## 2021-04-22 MED ORDER — BUDESONIDE 0.5 MG/2ML IN SUSP
0.5000 mg | Freq: Two times a day (BID) | RESPIRATORY_TRACT | Status: DC
  Administered 2021-04-22 – 2021-05-01 (×20): 0.5 mg via RESPIRATORY_TRACT
  Filled 2021-04-22 (×20): qty 2

## 2021-04-22 NOTE — Progress Notes (Signed)
PROGRESS NOTE  [name]:[id number] DOB: 03-Jan-1971 DOA: 04/21/2021 PCP: [name], NP  Brief History    [name] is a 51 y.o. female with medical history significant of HTN, COPD, chronic respiratory failure with hypoxia and hypercapnia on 3 L of oxygen at baseline, ESRD on HD(TTS),  DM type II, and gangrene with  associatedosteomyelitis status post fifth ray amputation of the right foot presents with complaints of 3-4 day of abdominal pain.  Describes it as a crampy on epigastric abdominal pain with associated symptoms of nausea and vomiting.  Initially she thought symptoms were possibly secondary to a stomach flu that has been going around and subsequently question if she was constipated.  She took stool softeners and had a bowel movement but symptoms did not improve.  Notes that pain would radiate to her back.  She chronically reports having intermittent chills, shortness of breath, wheezing, and productive cough.  However, she had noticed that her sputum had started to change colors.  Her nephrologist had placed her on Z-Pak 2 days ago but she was only able to keep the first dose down.  She had missed hemodialysis on Thursday, and today when she went only was able to tolerate about a hour. She does not drink alcohol.  Patient reported symptoms felt similar, but worse than prior bout of pancreatitis and she did not require hospitalization.  That was thought secondary to one of her medications like Ozempic.   On admission to the emergency department patient was noted to be afebrile with blood pressures elevated up to 155/88, and all other vital signs maintained.  Labs significant for sodium 128, potassium 5.6 CO2 19, BUN 73, creatinine 10.95, anion gap 22, alkaline phosphatase 357, lipase 439, and total protein 8.9. CT scan of the abdomen pelvis significant for concern for focal pancreatitis along with right-sided pleural thickening and rounded opacity noted in the posterior right  lower lobe favoring atelectasis.  This morning a rapid response was called on the patient due to nursing's concern over telemetry. The patient was sitting up in bed. She had recently been medicated for abdominal pain. She was awake and alert. She had no complaints. No tachycardia present on physical exam. EKG demonstrated NSR with HR of 89.  Consultants  Nephrology  Procedures  Dialysis  Antibiotics   Anti-infectives (From admission, onward)    None      Subjective  The patient is resting comfortably. No new complaints.  Objective   Vitals:  Vitals:   04/22/21 0817 04/22/21 1036  BP: 140/71 (!) 147/113  Pulse:  88  Resp: (!) 22 20  Temp:  98.2 F (36.8 C)  SpO2: 99% 95%    Exam:  Constitutional:  The patient is awake, alert, and oriented x 3. No acute distress. Respiratory:  No increased work of breathing. No wheezes, rales, or rhonchi No tactile fremitus Cardiovascular:  Regular rate and rhythm No murmurs, ectopy, or gallups. No lateral PMI. No thrills. Abdomen:  Abdomen is soft, non-tender, non-distended No hernias, masses, or organomegaly Normoactive bowel sounds.  Musculoskeletal:  No cyanosis, clubbing, or edema Skin:  No rashes, lesions, ulcers palpation of skin: no induration or nodules Neurologic:  CN 2-12 intact Sensation all 4 extremities intact Psychiatric:  Mental status Mood, affect appropriate Orientation to person, place, time  judgment and insight appear intact   I have personally reviewed the following:   Today's Data  Vitals  Lab Data  BMP Mg Phos CRP CBC  Micro  Data    Imaging  CT abdomen and pelvis  Cardiology Data  EKG  Other Data  Vitals  Scheduled Meds:  arformoterol  15 mcg Nebulization Q12H   budesonide  0.5 mg Nebulization BID   Chlorhexidine Gluconate Cloth  6 each Topical Q0600   citalopram  20 mg Oral QHS   ferric citrate  210 mg Oral TID WC   heparin  5,000 Units Subcutaneous Q8H   insulin  aspart  0-15 Units Subcutaneous TID WC   insulin glargine-yfgn  18 Units Subcutaneous QHS   loratadine  10 mg Oral QHS   multivitamin  1 tablet Oral QHS   pantoprazole (PROTONIX) IV  40 mg Intravenous Q24H   revefenacin  175 mcg Nebulization Daily   sodium chloride flush  3 mL Intravenous Q12H   Continuous Infusions:  Principal Problem:   Pancreatitis Active Problems:   Obesity, Class III, BMI 40-49.9 (morbid obesity) (HCC)   Type 2 diabetes mellitus with ESRD (end-stage renal disease) (HCC)   COPD with chronic bronchitis (HCC)   End-stage renal disease on hemodialysis (HCC)   Chronic respiratory failure with hypoxia (HCC)   Diabetic foot ulcer (HCC)   Hyponatremia   Metabolic acidosis, increased anion gap   GERD (gastroesophageal reflux disease)   LOS: 1 day   A & P  Assessment and Plan: * Pancreatitis- (present on admission) Patient presents with a 3 to 4-day history of epigastric abdominal pain with reports of radiation to the back unable to tolerate any significant p.o. intake.  Lipase elevated at 439.  Imaging significant for concern for focal pancreatitis.  Previous episode of pancreatitis noted in 05/2020, but thought possibly related to [place] which appears to have been discontinued.  She does not drink alcohol.  -Admit to a medical telemetry bed -Triglycerides are 151 -Clear liquid diet and advance as tolerated -Antiemetics as needed -Fentanyl IV as needed for pain -Hold empirically giving IV fluids unless patient becomes hypotensive -Would likely benefit from a GI follow-up/referral in the outpatient setting. Consider Dedicated MRI of the pancreas as recommended as there is concern for pancreatic carcinoma.   GERD (gastroesophageal reflux disease)- (present on admission) -Substitution of Protonix 40 mg IV for omeprazole  Metabolic acidosis, increased anion gap  Resolved after HD. Acute.  On admission CO2 19 and anion gap 22.  Glucose levels were within normal  limits.  Suspect secondary to missed hemodialysis sessions. -Continue to monitor  Hyponatremia- (present on admission) Acute on chronic.  Sodium 128 on admission which appears acutely, but similar seen in the past. Improved to 130 this morning. Possibly related with reports of nausea and vomiting versus patient being fluid overloaded from missed dialysis. -Monitor sodium and volume status is to be addressed through HD.   Diabetic foot ulcer ([place])- (present on admission) Patient has a known history of a diabetic foot wound previously diagnosed with gangrene and associated osteomyelitis requiring fifth ray amputation of the foot 09/2020.  Patient appears to have been previously recommended  to have amputation of the foot but and declined.  Right foot toes appear to be chronic in appearance. -Check ESR and CRP -Check x-rays of her foot -Wound care consulted  -Reassess and determine need of antibiotics and/or further work-up and evaluate -X-ray of the foot was performed. It demonstrated a faint lucency in the head of the right fourth metatarsal. This may represent osteomyelitis. MRI is recommended.   Chronic respiratory failure with hypoxia ([place])- (present on admission) Chronic respiratory failure on 3 L  nasal cannula oxygen 24/7 with O2 saturations currently maintained.  Patient had recently had a sleep study done in the outpatient setting, but has not heard back regarding results.  CT imaging did note concern for right-sided pleural effusion. -Continue to monitor O2 saturations -Continue nasal cannula oxygen 3 L and adjust as needed -Patient is currently requring 4L by [place] to maintain saturations in the 90's -Follow-up chest x-ray -Fluid management with dialysis -Follow-up with pulmonology in outpatient setting regarding sleep study   End-stage renal disease on hemodialysis [facility]) [name] last full hemodialysis session was on 2/21.  Labs significant for potassium 5.6, BUN 73, creatinine  10.95.  Nephrology was consulted. She underwent HD yesterday.  -HD per nephrology  COPD with chronic bronchitis ([place])- (present on admission) Patient reports chronically having cough which is of breath wheezing.  On physical exam no significant wheezing appreciated at this time but she had been started on azithromycin due to reports of change in sputum. -Complete course of azithromycin -Continue scheduled home breathing treatments and albuterol nebs as needed for shortness of breath/wheezing   Type 2 diabetes mellitus with ESRD (end-stage renal disease) ([place])- (present on admission) Admission into the emergency department patient blood glucose was noted to be 125.  Last hemoglobin A1c 8.9 in 09/2020.  On insulin regimen includes NovoLog insulin 0 to 15 units with meals and Tresiba 43 units nightly. -Hypoglycemic protocol  -Reduced pharmacy substitution of Tresiba to 23 units nightly -CBGs before every meal with moderate SSI -Adjust insulin regimen as needed Glucoses have been 70-107 in the last 24 hours. Will reduce lantus to 18 units daily from 23 units due to fairly low glucoses when the patient isn't eating much.   Obesity, Class III, BMI 40-49.9 (morbid obesity) ([place])- (present on admission) BMI 44.46 kg/m  I have seen and examined this patient myself. I have spent 34 minutes in his evaluation and care.  DVT prophylaxis: heparin Code Status: Full Code Family Communication: None available Disposition Plan: tbd    Vinod Mikesell, DO [facility] Direct contact: see [email or web address]  7PM-7AM contact night coverage as above 04/22/2021, 2:43 PM  LOS: 1 day

## 2021-04-22 NOTE — Progress Notes (Signed)
°  Chaplain On-Call responded to Rapid Response notification.  Patient is recovering now from the event, per Staff.  Chaplains are available for additional support if requested.  Chaplain Pollyann Samples M.Div., [facility]

## 2021-04-22 NOTE — Progress Notes (Signed)
Pt asking for tylenol, immediately left room to pull tylenol from pyxis, upon returning to room, family states that they gave her tylenol from there bag, educated family and pt not to take medications from outside of the hospital, states understanding

## 2021-04-22 NOTE — Progress Notes (Addendum)
Around 0745 noted HR 110-190's. Rest of VSS. NAD. Reports nausea and epigastric pain that radiates to RUQ and back . Pain improved since received dilaudid.  At that time it was not clear who is the assigned MD for this patient.  Called rapid response. Then Charge RN find out the MD is Dr. Gunnar Bulla. MD has been notified and came to patient's room.  EKG ordered. EKG showed NSR, HR 89.  Dr. Gunnar Bulla made aware of EKG results.

## 2021-04-22 NOTE — Progress Notes (Signed)
[name]  MRN: [id number]  DOB/AGE: 07/02/1970 51 y.o.  Primary Care Physician:[name], NP  Admit date: 04/21/2021  Chief Complaint:  Chief Complaint  Patient presents with   Abdominal Pain   Nausea    S-Pt presented on  04/21/2021 with  Chief Complaint  Patient presents with   Abdominal Pain   Nausea  . Patient is a 51 year old African-American female with a past medical history of end-stage renal disease, patient is on Tuesday Thursday Saturday schedule, diabetes mellitus, history of pancreatitis who came to the ER with chief complaint of abdominal pain.  Patient seen today on first floor Patient offers no new specific physical complaint. Patient states abdominal pain is now better . Patient resting comfortably on the bed Patient major concern in today visit was about how long will it take for her to stay admitted?  What treatment she is on for her pancreatitis? Patient informed me that she will discuss these issues with her primary team    CZY:SAYTK from the symptoms mentioned above,there are no other symptoms referable to all systems reviewed.  Physical Exam: Vital signs in last 24 hours: Temp:  [97.8 F (36.6 C)-98.8 F (37.1 C)] 98.8 F (37.1 C) (02/26 0750) Pulse Rate:  [80-111] 106 (02/26 0750) Resp:  [14-24] 22 (02/26 0817) BP: (81-155)/(44-121) 140/71 (02/26 0817) SpO2:  [90 %-100 %] 99 % (02/26 0817) Weight:  [117.5 kg-117.8 kg] 117.8 kg (02/26 0648) Weight change:  Last BM Date : 04/20/21  Intake/Output from previous day: 02/25 0701 - 02/26 0700 In: -  Out: 1351  No intake/output data recorded.   Physical Exam:  General- pt is awake,alert, oriented to time place and person  Resp- No acute REsp distress, CTA B/L NO Rhonchi  CVS- S1S2 regular in rate and rhythm  GIT- BS+, soft, minimal tenderness  , Non distended  EXT- trace LE Edema,  No Cyanosis  Access-  LUE AVF  Lab Results:  CBC  Recent Labs    04/21/21 0853  04/22/21 0354  WBC 9.3 8.9  HGB 13.5 12.2  HCT 44.5 40.4  PLT 294 301    BMET  Recent Labs    04/21/21 0853 04/22/21 0354  NA 128* 130*  K 5.6* 4.5  CL 87* 90*  CO2 19* 26  GLUCOSE 125* 107*  BUN 73* 47*  CREATININE 10.95* 8.61*  CALCIUM 8.8* 8.6*      Most recent Creatinine trend  Lab Results  Component Value Date   CREATININE 8.61 (H) 04/22/2021   CREATININE 10.95 (H) 04/21/2021   CREATININE 5.07 (H) 02/22/2021      MICRO   Recent Results (from the past 240 hour(s))  Resp Panel by RT-PCR (Flu A&B, Covid) Nasopharyngeal Swab     Status: None   Collection Time: 04/21/21 12:16 PM   Specimen: Nasopharyngeal Swab; Nasopharyngeal(NP) swabs in vial transport medium  Result Value Ref Range Status   SARS Coronavirus 2 by RT PCR NEGATIVE NEGATIVE Final    Comment: (NOTE) SARS-CoV-2 target nucleic acids are NOT DETECTED.  The SARS-CoV-2 RNA is generally detectable in upper respiratory specimens during the acute phase of infection. The lowest concentration of SARS-CoV-2 viral copies this assay can detect is 138 copies/mL. A negative result does not preclude SARS-Cov-2 infection and should not be used as the sole basis for treatment or other patient management decisions. A negative result may occur with  improper specimen collection/handling, submission of specimen other than nasopharyngeal swab, presence of viral mutation(s) within the  areas targeted by this assay, and inadequate number of viral copies(<138 copies/mL). A negative result must be combined with clinical observations, patient history, and epidemiological information. The expected result is Negative.  Fact Sheet for Patients:  EntrepreneurPulse.com.au  Fact Sheet for Healthcare Providers:  IncredibleEmployment.be  This test is no t yet approved or cleared by the [place] FDA and  has been authorized for detection and/or diagnosis of SARS-CoV-2 by FDA under  an Emergency Use Authorization (EUA). This EUA will remain  in effect (meaning this test can be used) for the duration of the COVID-19 declaration under Section 564(b)(1) of the Act, 21 U.S.C.section 360bbb-3(b)(1), unless the authorization is terminated  or revoked sooner.       Influenza A by PCR NEGATIVE NEGATIVE Final   Influenza B by PCR NEGATIVE NEGATIVE Final    Comment: (NOTE) The Xpert Xpress SARS-CoV-2/FLU/RSV plus assay is intended as an aid in the diagnosis of influenza from Nasopharyngeal swab specimens and should not be used as a sole basis for treatment. Nasal washings and aspirates are unacceptable for Xpert Xpress SARS-CoV-2/FLU/RSV testing.  Fact Sheet for Patients: EntrepreneurPulse.com.au  Fact Sheet for Healthcare Providers: IncredibleEmployment.be  This test is not yet approved or cleared by the [place] FDA and has been authorized for detection and/or diagnosis of SARS-CoV-2 by FDA under an Emergency Use Authorization (EUA). This EUA will remain in effect (meaning this test can be used) for the duration of the COVID-19 declaration under Section 564(b)(1) of the Act, 21 U.S.C. section 360bbb-3(b)(1), unless the authorization is terminated or revoked.  Performed at [facility], [address], [place], [place] [address]      CT abdomen done on April 21, 2021. IMPRESSION: Enlarged pancreatic head with adjacent peripancreatic soft tissue stranding. This is suspicious for focal pancreatitis, although pancreatic carcinoma cannot definitely be excluded. Recommend correlation with serum lipase level, and continued follow-up by pancreatic protocol MRI or CT without and with contrast.   Right-sided pleural thickening and rounded opacity in posterior right lower lobe, new since 2016 exam. Rounded atelectasis is favored, however pulmonary infiltrate or neoplasm cannot definitely be excluded. Recommend  clinical correlation, and short-term follow-up with chest CT or PET-CT.   Impression:    Patient is well-known to our practice as patient undergoes dialysis at [facility] dialysis under care of Dr. Murlean Iba Patient is on Tuesday Thursday Saturday schedule Patient has a target dry weight of 119.5 kg  1)Renal   End-stage renal disease Patient is a hemodialysis Patient is on Tuesday Thursday Saturday schedule Patient missed her dialysis on Thursday only completed 1 hour today  Patient was last dialyzed yesterday  2)HTN    Patient blood pressures goal   3)Anemia of chronic disease  CBC Latest Ref Rng & Units 04/22/2021 04/21/2021 02/22/2021  WBC 4.0 - 10.5 K/uL 8.9 9.3 6.1  Hemoglobin 12.0 - 15.0 g/dL 12.2 13.5 10.7(L)  Hematocrit 36.0 - 46.0 % 40.4 44.5 35.3(L)  Platelets 150 - 400 K/uL 301 294 341      Patient hemoglobin is more than 12 No need for Epogen Patient did receive Mircera as outpatient   4) Secondary hyperparathyroidism -CKD Mineral-Bone Disorder/Renal failure associated hyperphosphatemia yes I is    Lab Results  Component Value Date   PTH 134 (H) 04/21/2017   CALCIUM 8.6 (L) 04/22/2021   CAION 0.81 (LL) 10/26/2020   PHOS 8.0 (H) 04/22/2021    Secondary Hyperparathyroidism  present  Phosphorus is not  at goal. Patient is  not on any binders secondary to pancreatitis We will follow  5)Pancreatitis Patient is admitted with acute pancreatitis Primary team is following   6) Electrolytes   BMP Latest Ref Rng & Units 04/22/2021 04/21/2021 02/22/2021  Glucose 70 - 99 mg/dL 107(H) 125(H) 180(H)  BUN 6 - 20 mg/dL 47(H) 73(H) 24(H)  Creatinine 0.44 - 1.00 mg/dL 8.61(H) 10.95(H) 5.07(H)  Sodium 135 - 145 mmol/L 130(L) 128(L) 136  Potassium 3.5 - 5.1 mmol/L 4.5 5.6(H) 3.7  Chloride 98 - 111 mmol/L 90(L) 87(L) 96(L)  CO2 22 - 32 mmol/L 26 19(L) 29  Calcium 8.9 - 10.3 mg/dL 8.6(L) 8.8(L) 8.6(L)     Sodium Hyponatremia Secondary to ESRD Now  better  Potassium Hyperkalemia Secondary to ESRD Now better   7) Acute metabolic acidosis Most likely secondary to GI losses and ESRD Now better       Plan:  No need for renal placement therapy today We will hold off on starting patient back on binders till her pancreatitis issue gets resolved      Monic Engelmann s [facility] 04/22/2021, 8:34 AM

## 2021-04-22 NOTE — Significant Event (Signed)
Rapid Response Event Note   Reason for Call : called RRT for pt with HR fluctuating into 180's on monitor?   Initial Focused Assessment: Pt sitting up in bed, alert and oriented x 4, no c/o pain. VSS...       Interventions: Dr Benny Lennert to bedside, stat EKG ordered, SR...   Plan of Care: RN Malka to call if further assistance needed.    Event Summary: as above  MD Notified: Dr Benny Lennert, 0800 Call [place] A, RN

## 2021-04-23 ENCOUNTER — Inpatient Hospital Stay: Payer: Medicare Other

## 2021-04-23 DIAGNOSIS — K859 Acute pancreatitis without necrosis or infection, unspecified: Secondary | ICD-10-CM | POA: Diagnosis not present

## 2021-04-23 DIAGNOSIS — J9611 Chronic respiratory failure with hypoxia: Secondary | ICD-10-CM | POA: Diagnosis not present

## 2021-04-23 DIAGNOSIS — J449 Chronic obstructive pulmonary disease, unspecified: Secondary | ICD-10-CM | POA: Diagnosis not present

## 2021-04-23 DIAGNOSIS — E11621 Type 2 diabetes mellitus with foot ulcer: Secondary | ICD-10-CM | POA: Diagnosis not present

## 2021-04-23 LAB — RENAL FUNCTION PANEL
Albumin: 3.5 g/dL (ref 3.5–5.0)
Anion gap: 18 — ABNORMAL HIGH (ref 5–15)
BUN: 55 mg/dL — ABNORMAL HIGH (ref 6–20)
CO2: 22 mmol/L (ref 22–32)
Calcium: 8.8 mg/dL — ABNORMAL LOW (ref 8.9–10.3)
Chloride: 90 mmol/L — ABNORMAL LOW (ref 98–111)
Creatinine, Ser: 10.2 mg/dL — ABNORMAL HIGH (ref 0.44–1.00)
GFR, Estimated: 4 mL/min — ABNORMAL LOW (ref >60)
Glucose, Bld: 113 mg/dL — ABNORMAL HIGH (ref 70–99)
Phosphorus: 9.2 mg/dL — ABNORMAL HIGH (ref 2.5–4.6)
Potassium: 5.5 mmol/L — ABNORMAL HIGH (ref 3.5–5.1)
Sodium: 130 mmol/L — ABNORMAL LOW (ref 135–145)

## 2021-04-23 LAB — GLUCOSE, CAPILLARY
Glucose-Capillary: 114 mg/dL — ABNORMAL HIGH (ref 70–99)
Glucose-Capillary: 114 mg/dL — ABNORMAL HIGH (ref 70–99)
Glucose-Capillary: 156 mg/dL — ABNORMAL HIGH (ref 70–99)
Glucose-Capillary: 102 mg/dL — ABNORMAL HIGH (ref 70–99)

## 2021-04-23 LAB — CBC WITH DIFFERENTIAL/PLATELET
Abs Immature Granulocytes: 0.02 10*3/uL (ref 0.00–0.07)
Basophils Absolute: 0.1 10*3/uL (ref 0.0–0.1)
Basophils Relative: 1 %
Eosinophils Absolute: 0.2 10*3/uL (ref 0.0–0.5)
Eosinophils Relative: 3 %
HCT: 44.1 % (ref 36.0–46.0)
Hemoglobin: 13.5 g/dL (ref 12.0–15.0)
Immature Granulocytes: 0 %
Lymphocytes Relative: 14 %
Lymphs Abs: 0.9 10*3/uL (ref 0.7–4.0)
MCH: 28.4 pg (ref 26.0–34.0)
MCHC: 30.6 g/dL (ref 30.0–36.0)
MCV: 92.6 fL (ref 80.0–100.0)
Monocytes Absolute: 0.6 10*3/uL (ref 0.1–1.0)
Monocytes Relative: 9 %
Neutro Abs: 4.8 10*3/uL (ref 1.7–7.7)
Neutrophils Relative %: 73 %
Platelets: 295 10*3/uL (ref 150–400)
RBC: 4.76 MIL/uL (ref 3.87–5.11)
RDW: 14.8 % (ref 11.5–15.5)
WBC: 6.6 10*3/uL (ref 4.0–10.5)
nRBC: 0 % (ref 0.0–0.2)

## 2021-04-23 LAB — MRSA NEXT GEN BY PCR, NASAL: MRSA by PCR Next Gen: NOT DETECTED

## 2021-04-23 MED ORDER — HYDROMORPHONE HCL 1 MG/ML IJ SOLN
1.0000 mg | INTRAMUSCULAR | Status: DC | PRN
  Administered 2021-04-23 (×3): 1 mg via INTRAVENOUS
  Filled 2021-04-23 (×3): qty 1

## 2021-04-23 MED ORDER — HYDROMORPHONE HCL 1 MG/ML IJ SOLN
1.0000 mg | INTRAMUSCULAR | Status: DC | PRN
  Administered 2021-04-23 – 2021-04-29 (×27): 1 mg via INTRAVENOUS
  Filled 2021-04-23 (×28): qty 1

## 2021-04-23 MED ORDER — PANTOPRAZOLE SODIUM 40 MG PO TBEC
40.0000 mg | DELAYED_RELEASE_TABLET | Freq: Every day | ORAL | Status: DC
  Administered 2021-04-23 – 2021-04-30 (×8): 40 mg via ORAL
  Filled 2021-04-23 (×8): qty 1

## 2021-04-23 MED ORDER — POVIDONE-IODINE 10 % EX SWAB: Freq: Every day | CUTANEOUS | Status: DC

## 2021-04-23 MED ORDER — OXYCODONE HCL 5 MG PO TABS
5.0000 mg | ORAL_TABLET | ORAL | Status: DC | PRN
  Administered 2021-04-23 – 2021-05-01 (×24): 10 mg via ORAL
  Filled 2021-04-23 (×25): qty 2

## 2021-04-23 NOTE — TOC Initial Note (Signed)
Transition of Care [facility]) - Initial/Assessment Note    Patient Details  Name: [name] MRN: [id number] Date of Birth: [birth year]  Transition of Care [facility]) CM/SW Contact:    Pete Pelt, RN Phone Number: 04/23/2021, 10:41 AM  Clinical Narrative:     Patient lives at home with husband, who she says takes care of her along with home health for her foot dressing.  She states she is not worried at all about her foot.  She just wants her pancreatitis to go away and then to go home.  Patient has no concerns about medications, transportation or appointments.  She is able to pick up medications at [facility] until they get delivered.  Patient states she has home health with [facility].  Malachy Mood confirms they will take her back upon discharge.  TOC contact information provided to patient.              Expected Discharge Plan: Home/Self Care Barriers to Discharge: Continued Medical Work up   Patient Goals and CMS Choice     Choice offered to / list presented to : NA  Expected Discharge Plan and Services Expected Discharge Plan: Home/Self Care   Discharge Planning Services: CM Consult   Living arrangements for the past 2 months: Single Family Home                           HH Arranged: RN Industry Agency: [place] Date [place]: 04/23/21 Time [place]: 53 Representative spoke with at [place]: Malachy Mood  Prior Living Arrangements/Services Living arrangements for the past 2 months: [place] with:: Self, Spouse Patient language and need for interpreter reviewed:: Yes (no interpreter required) Do you feel safe going back to the place where you live?: Yes      Need for Family Participation in Patient Care: Yes (Comment) Care giver support system in place?: Yes (comment)   Criminal Activity/Legal Involvement Pertinent to Current Situation/Hospitalization: No - Comment as needed  Activities of Daily Living Home Assistive  Devices/Equipment: Shower chair with back, Environmental consultant (specify type), Wheelchair, Hand-held shower hose, Grab bars around toilet, Grab bars in shower ADL Screening (condition at time of admission) [name] cognitive ability adequate to safely complete daily activities?: Yes Is the patient deaf or have difficulty hearing?: No Does the patient have difficulty seeing, even when wearing glasses/contacts?: No Does the patient have difficulty concentrating, remembering, or making decisions?: No Patient able to express need for assistance with ADLs?: Yes Does the patient have difficulty dressing or bathing?: Yes Independently performs ADLs?: No Communication: Independent Dressing (OT): Needs assistance Is this a change from baseline?: Pre-admission baseline Grooming: Needs assistance Is this a change from baseline?: Pre-admission baseline Feeding: Independent Bathing: Needs assistance Is this a change from baseline?: Pre-admission baseline Toileting: Needs assistance Is this a change from baseline?: Pre-admission baseline In/Out Bed: Dependent Is this a change from baseline?: Pre-admission baseline Walks in Home: Dependent Is this a change from baseline?: Pre-admission baseline Does the patient have difficulty walking or climbing stairs?: Yes Weakness of Legs: Both Weakness of Arms/Hands: None  Permission Sought/Granted Permission sought to share information with : Case Manager Permission granted to share information with : Yes, Verbal Permission Granted     Permission granted to share info w AGENCY: Home health with [facility]        Emotional Assessment Appearance:: Appears stated age Attitude/Demeanor/Rapport: Gracious, Engaged Affect (typically observed): Pleasant, Appropriate Orientation: :  Oriented to Self, Oriented to Place, Oriented to  Time, Oriented to Situation Alcohol / Substance Use: Not Applicable Psych Involvement: No (comment)  Admission diagnosis:  Pancreatitis  [K85.90] SOB (shortness of breath) [R06.02] RUQ pain [R10.11] ESRD (end stage renal disease) ([place]) [N18.6] Status post thoracentesis [Z98.890] History of osteomyelitis [Z87.39] Acute pancreatitis, unspecified complication status, unspecified pancreatitis type [K85.90] Patient Active Problem List   Diagnosis Date Noted   Pancreatitis 02/27/7251   Metabolic acidosis, increased anion gap 04/21/2021   GERD (gastroesophageal reflux disease) 04/21/2021   Acute on chronic diastolic CHF (congestive heart failure) ([place]) 11/14/2020   Anemia of chronic disease    Hyponatremia    Gangrene (HCC)    PVD (peripheral vascular disease) (HCC)    Aspiration into airway    Hearing loss of right ear    Diabetic foot ulcer ([place]) 10/15/2020   Decreased pedal pulses 10/15/2020   Chronic respiratory failure with hypoxia (HCC)    Extreme obesity with alveolar hypoventilation (HCC)    Palliative care by specialist    Goals of care, counseling/discussion    End-stage renal disease on hemodialysis ([place])    Adjustment disorder with mixed anxiety and depressed mood 03/31/2017   Dysthymia 03/31/2017   Chronic pain syndrome 03/31/2017   Palliative care encounter    Obesity hypoventilation syndrome ([place]) 03/09/2017   Chronic diastolic heart failure ([place]) 01/07/2017   Depression 09/24/2016   Acute on chronic diastolic heart failure (HCC) 07/04/2016   COPD with chronic bronchitis ([place]) 07/04/2016   Acute on chronic respiratory failure (HCC) 06/26/2016   CKD (chronic kidney disease), stage III ([place]) 06/26/2016   COPD exacerbation ([place]) 03/25/2016   Anxiety 10/02/2015   Asthma 08/30/2015   Type 2 diabetes mellitus with ESRD (end-stage renal disease) ([place]) 03/13/2015   Iron deficiency anemia 02/17/2015   Obesity, Class III, BMI 40-49.9 (morbid obesity) ([place])    Shortness of breath    Essential hypertension    Hypertensive heart disease with congestive heart failure ([place]) 12/18/2014   Diabetes mellitus with  ESRD (end-stage renal disease) ([place]) 12/15/2014   PCP:  [name], NP Pharmacy:   [facility] [address]), [place] [address] [place]) [place] [address] Phone: [phone] Fax: [phone]  [facility] [address] [place] [address] Phone: [phone] Fax: [phone]     Social Determinants of Health (SDOH) Interventions    Readmission Risk Interventions No flowsheet data found.

## 2021-04-23 NOTE — Progress Notes (Addendum)
Patient refused her 18 units of long acting insulin, patient alert and oriented, will continue to monitor, hopitalist made aware

## 2021-04-23 NOTE — Progress Notes (Signed)
Patient needs 20 G PIV access for CT of Abd. Restricted on Lt. Arm due to Fist[place]. Assessed Rt. Arm by ultrasound, and patient has only very small veins for 20 G. Informed patent's RN regarding this matter. [name] RN will contact to MD and will notify this matter. HS [employer]

## 2021-04-23 NOTE — Progress Notes (Signed)
PHARMACIST - PHYSICIAN COMMUNICATION   CONCERNING: IV to Oral Route Change Policy  RECOMMENDATION: This patient is receiving pantoprazole by the intravenous route.  Based on criteria approved by the Pharmacy and Therapeutics Committee, the intravenous medication(s) is/are being converted to the equivalent oral dose form(s).   DESCRIPTION: These criteria include: The patient is eating (either orally or via tube) and/or has been taking other orally administered medications for a least 24 hours The patient has no evidence of active gastrointestinal bleeding or impaired GI absorption (gastrectomy, short bowel, patient on TNA or NPO).  If you have questions about this conversion, please contact the [place] 04/23/2021 8:14 AM

## 2021-04-23 NOTE — Progress Notes (Signed)
PROGRESS NOTE  [name] HYW:[id number] DOB: [birth year] DOA: 04/21/2021 PCP: [name], NP  Brief History    [name] is a 51 y.o. female with medical history significant of HTN, COPD, chronic respiratory failure with hypoxia and hypercapnia on 3 L of oxygen at baseline, ESRD on HD(TTS),  DM type II, and gangrene with  associatedosteomyelitis status post fifth ray amputation of the right foot presents with complaints of 3-4 day of abdominal pain.  Describes it as a crampy on epigastric abdominal pain with associated symptoms of nausea and vomiting.  Initially she thought symptoms were possibly secondary to a stomach flu that has been going around and subsequently question if she was constipated.  She took stool softeners and had a bowel movement but symptoms did not improve.  Notes that pain would radiate to her back.  She chronically reports having intermittent chills, shortness of breath, wheezing, and productive cough.  However, she had noticed that her sputum had started to change colors.  Her nephrologist had placed her on Z-Pak 2 days ago but she was only able to keep the first dose down.  She had missed hemodialysis on Thursday, and today when she went only was able to tolerate about a hour. She does not drink alcohol.  Patient reported symptoms felt similar, but worse than prior bout of pancreatitis and she did not require hospitalization.  That was thought secondary to one of her medications like Ozempic.   On admission to the emergency department patient was noted to be afebrile with blood pressures elevated up to 155/88, and all other vital signs maintained.  Labs significant for sodium 128, potassium 5.6 CO2 19, BUN 73, creatinine 10.95, anion gap 22, alkaline phosphatase 357, lipase 439, and total protein 8.9. CT scan of the abdomen pelvis significant for concern for focal pancreatitis along with right-sided pleural thickening and rounded opacity noted in the posterior right  lower lobe favoring atelectasis.  This morning a rapid response was called on the patient due to nursing's concern over telemetry. The patient was sitting up in bed. She had recently been medicated for abdominal pain. She was awake and alert. She had no complaints. No tachycardia present on physical exam. EKG demonstrated NSR with HR of 89.  Consultants  Nephrology  Procedures  Dialysis  Antibiotics   Anti-infectives (From admission, onward)    None      Subjective  The patient is resting comfortably. She is complaining of continued severe epigastric abdominal pain.  Objective   Vitals:  Vitals:   04/23/21 0518 04/23/21 0805  BP: (!) 147/83 140/67  Pulse: 86 84  Resp: 17 16  Temp: 98 F (36.7 C) 98 F (36.7 C)  SpO2: 93% 93%    Exam:  Constitutional:  The patient is awake, alert, and oriented x 3. No acute distress. Respiratory:  No increased work of breathing. No wheezes, rales, or rhonchi No tactile fremitus Cardiovascular:  Regular rate and rhythm No murmurs, ectopy, or gallups. No lateral PMI. No thrills. Abdomen:  Abdomen is soft, and non-distended Positive for epigastric abdominal pain. No hernias, masses, or organomegaly Normoactive bowel sounds.  Musculoskeletal:  No cyanosis, clubbing, or edema Skin:  No rashes, lesions, ulcers palpation of skin: no induration or nodules Neurologic:  CN 2-12 intact Sensation all 4 extremities intact Psychiatric:  Mental status Mood, affect appropriate Orientation to person, place, time  judgment and insight appear intact   I have personally reviewed the following:   Today's Data  Vitals  Lab Data  BMP Mg Phos CRP CBC  Micro Data    Imaging  CT abdomen and pelvis  Cardiology Data  EKG  Other Data  Vitals  Scheduled Meds:  arformoterol  15 mcg Nebulization Q12H   budesonide  0.5 mg Nebulization BID   Chlorhexidine Gluconate Cloth  6 each Topical Q0600   citalopram  20 mg Oral QHS    ferric citrate  210 mg Oral TID WC   heparin  5,000 Units Subcutaneous Q8H   insulin aspart  0-15 Units Subcutaneous TID WC   insulin glargine-yfgn  18 Units Subcutaneous QHS   loratadine  10 mg Oral QHS   multivitamin  1 tablet Oral QHS   pantoprazole  40 mg Oral QHS   povidone-iodine   Topical Daily   revefenacin  175 mcg Nebulization Daily   sodium chloride flush  3 mL Intravenous Q12H   Continuous Infusions:  Principal Problem:   Pancreatitis Active Problems:   Obesity, Class III, BMI 40-49.9 (morbid obesity) (HCC)   Type 2 diabetes mellitus with ESRD (end-stage renal disease) (HCC)   COPD with chronic bronchitis (HCC)   End-stage renal disease on hemodialysis (HCC)   Chronic respiratory failure with hypoxia (HCC)   Diabetic foot ulcer (HCC)   Hyponatremia   Metabolic acidosis, increased anion gap   GERD (gastroesophageal reflux disease)   LOS: 2 days   A & P  Assessment and Plan: Assessment and Plan: * Pancreatitis- (present on admission) Patient presents with a 3 to 4-day history of epigastric abdominal pain with reports of radiation to the back unable to tolerate any significant p.o. intake.  Lipase elevated at 439.  Imaging significant for concern for focal pancreatitis.  Previous episode of pancreatitis noted in 05/2020, but thought possibly related to [place] which appears to have been discontinued.  She does not drink alcohol.  -Admit to a medical telemetry bed -Triglycerides are 151 -Clear liquid diet and advance as tolerated -Antiemetics as needed -Fentanyl IV as needed for pain -Hold empirically giving IV fluids unless patient becomes hypotensive -Would likely benefit from a GI follow-up/referral in the outpatient setting. The patient continues to complain of severe pain. Will check dedicated CT of pancreas as recommended.   GERD (gastroesophageal reflux disease)- (present on admission) -Substitution of Protonix 40 mg IV for omeprazole  Metabolic acidosis,  increased anion gap  Resolved after HD. Acute.  On admission CO2 19 and anion gap 22.  Glucose levels were within normal limits.  Suspect secondary to missed hemodialysis sessions. -Continue to monitor  Hyponatremia- (present on admission) Acute on chronic. Resolved. Sodium 130. Monitor.     Diabetic foot ulcer ([place])- (present on admission) Patient has a known history of a diabetic foot wound previously diagnosed with gangrene and associated osteomyelitis requiring fifth ray amputation of the foot 09/2020.  Patient appears to have been previously recommended  to have amputation of the foot but and declined.  Right foot toes appear to be chronic in appearance. -Check ESR and CRP -Check x-rays of her foot -Wound care consulted  -Reassess and determine need of antibiotics and/or further work-up and evaluate -X-ray of the foot was performed. It demonstrated a faint lucency in the head of the right fourth metatarsal. This may represent osteomyelitis. MRI is recommended.   Chronic respiratory failure with hypoxia (HCC)- (present on admission) Chronic respiratory failure on 3 L nasal cannula oxygen 24/7 with O2 saturations currently maintained.  Patient had recently had a sleep study done in  the outpatient setting, but has not heard back regarding results.  CT imaging did note concern for right-sided pleural effusion. -Continue to monitor O2 saturations -Continue nasal cannula oxygen 3 L and adjust as needed -Patient is currently at baseline O2 requirements at 3L by [place] to maintain saturation of 93% -Follow-up chest x-ray -Fluid management with dialysis -Follow-up with pulmonology in outpatient setting regarding sleep study   End-stage renal disease on hemodialysis [facility]) [name] last full hemodialysis session was on 2/21.  Labs significant for potassium 5.6, BUN 73, creatinine 10.95.  Nephrology was consulted. She underwent HD yesterday.  -HD per nephrology  COPD with chronic bronchitis  ([place])- (present on admission) Patient reports chronically having cough which is of breath wheezing.  On physical exam no significant wheezing appreciated at this time but she had been started on azithromycin due to reports of change in sputum. -Complete course of azithromycin -Continue scheduled home breathing treatments and albuterol nebs as needed for shortness of breath/wheezing   Type 2 diabetes mellitus with ESRD (end-stage renal disease) ([place])- (present on admission) Admission into the emergency department patient blood glucose was noted to be 125.  Last hemoglobin A1c 8.9 in 09/2020.  On insulin regimen includes NovoLog insulin 0 to 15 units with meals and Tresiba 43 units nightly. -Hypoglycemic protocol  -Reduced pharmacy substitution of Tresiba to 23 units nightly -CBGs before every meal with moderate SSI -Adjust insulin regimen as needed Glucoses have been 110-114 in the last 24 hours.    Obesity, Class III, BMI 40-49.9 (morbid obesity) ([place])- (present on admission) BMI 44.46 kg/m    I have seen and examined this patient myself. I have spent 34 minutes in his evaluation and care.  DVT prophylaxis: heparin Code Status: Full Code Family Communication: None available Disposition Plan: tbd    [name] Caissie, DO [facility] Direct contact: see [email or web address]  7PM-7AM contact night coverage as above 04/23/2021, 11:29 PM  LOS: 1 day

## 2021-04-23 NOTE — Consult Note (Signed)
[facility] Nurse Consult Note: Patient receiving care in [facility]. Consult completed remotely after review of record and images. Reason for Consult: Right foot wounds Wound type: ischemic, gangrene. Patient has been followed by Podiatry for the foot.  Per Daylene Katayama, DPM note on 12/29/20: It was assessed as gangrene. The foot was found to be "irreversibly ischemic and past the point of salvage.  Below-knee amputation was recommended today however the patient continues to refuse".  Wet-to-dry betadine dressings were ordered to be continued. Pressure Injury POA: NA Measurement: Wound bed: see photos Drainage (amount, consistency, odor)  Periwound: Dressing procedure/placement/frequency: Daily Betadine moistened gauze secured with kerlix was ordered for the right foot.   [place] nurse will not follow at this time.  Please re-consult the [place] team if needed.  Val Riles, RN, MSN, CWOCN, CNS-BC, pager [phone]

## 2021-04-23 NOTE — Progress Notes (Signed)
[name]  MRN: [id number]  DOB/AGE: 1970/03/14 51 y.o.  Primary Care Physician:[name], NP  Admit date: 04/21/2021  Chief Complaint:  Chief Complaint  Patient presents with   Abdominal Pain   Nausea    S-Pt presented on  04/21/2021 with  Chief Complaint  Patient presents with   Abdominal Pain   Nausea  . Patient is a 51 year old African-American female with a past medical history of end-stage renal disease, patient is on Tuesday Thursday Saturday schedule, diabetes mellitus, history of pancreatitis who came to the ER with chief complaint of abdominal pain.  Patient seen sitting up in bed attempting to eat clear liquid breakfast Denies nausea and vomiting Pain well managed with prescribed medications No other complaints at this time    EZM:OQHUT from the symptoms mentioned above,there are no other symptoms referable to all systems reviewed.  Physical Exam: Vital signs in last 24 hours: Temp:  [97.6 F (36.4 C)-98.6 F (37 C)] 97.6 F (36.4 C) (02/27 1142) Pulse Rate:  [82-95] 95 (02/27 1142) Resp:  [15-20] 16 (02/27 1142) BP: (110-169)/(65-89) 169/89 (02/27 1142) SpO2:  [91 %-100 %] 100 % (02/27 1142) Weight change:  Last BM Date : 04/21/21  Intake/Output from previous day: 02/26 0701 - 02/27 0700 In: 600 [P.O.:600] Out: -  Total I/O In: 240 [P.O.:240] Out: -    Physical Exam:  General- pt is awake,alert, oriented to time place and person  Resp- No acute REsp distress, CTA B/L NO Rhonchi  CVS- S1S2 regular in rate and rhythm  GIT- BS+, soft, minimal tenderness  , Non distended  EXT- trace LE Edema,  No Cyanosis right foot dressing  Access-  LUE AVF  Lab Results:  CBC  Recent Labs    04/22/21 0354 04/23/21 0653  WBC 8.9 6.6  HGB 12.2 13.5  HCT 40.4 44.1  PLT 301 295     BMET  Recent Labs    04/21/21 0853 04/22/21 0354  NA 128* 130*  K 5.6* 4.5  CL 87* 90*  CO2 19* 26  GLUCOSE 125* 107*  BUN 73* 47*  CREATININE  10.95* 8.61*  CALCIUM 8.8* 8.6*       Most recent Creatinine trend  Lab Results  Component Value Date   CREATININE 8.61 (H) 04/22/2021   CREATININE 10.95 (H) 04/21/2021   CREATININE 5.07 (H) 02/22/2021       MICRO   Recent Results (from the past 240 hour(s))  Resp Panel by RT-PCR (Flu A&B, Covid) Nasopharyngeal Swab     Status: None   Collection Time: 04/21/21 12:16 PM   Specimen: Nasopharyngeal Swab; Nasopharyngeal(NP) swabs in vial transport medium  Result Value Ref Range Status   SARS Coronavirus 2 by RT PCR NEGATIVE NEGATIVE Final    Comment: (NOTE) SARS-CoV-2 target nucleic acids are NOT DETECTED.  The SARS-CoV-2 RNA is generally detectable in upper respiratory specimens during the acute phase of infection. The lowest concentration of SARS-CoV-2 viral copies this assay can detect is 138 copies/mL. A negative result does not preclude SARS-Cov-2 infection and should not be used as the sole basis for treatment or other patient management decisions. A negative result may occur with  improper specimen collection/handling, submission of specimen other than nasopharyngeal swab, presence of viral mutation(s) within the areas targeted by this assay, and inadequate number of viral copies(<138 copies/mL). A negative result must be combined with clinical observations, patient history, and epidemiological information. The expected result is Negative.  Fact Sheet for Patients:  EntrepreneurPulse.com.au  Fact Sheet for Healthcare Providers:  IncredibleEmployment.be  This test is no t yet approved or cleared by the [place] FDA and  has been authorized for detection and/or diagnosis of SARS-CoV-2 by FDA under an Emergency Use Authorization (EUA). This EUA will remain  in effect (meaning this test can be used) for the duration of the COVID-19 declaration under Section 564(b)(1) of the Act, 21 U.S.C.section 360bbb-3(b)(1), unless the  authorization is terminated  or revoked sooner.       Influenza A by PCR NEGATIVE NEGATIVE Final   Influenza B by PCR NEGATIVE NEGATIVE Final    Comment: (NOTE) The Xpert Xpress SARS-CoV-2/FLU/RSV plus assay is intended as an aid in the diagnosis of influenza from Nasopharyngeal swab specimens and should not be used as a sole basis for treatment. Nasal washings and aspirates are unacceptable for Xpert Xpress SARS-CoV-2/FLU/RSV testing.  Fact Sheet for Patients: EntrepreneurPulse.com.au  Fact Sheet for Healthcare Providers: IncredibleEmployment.be  This test is not yet approved or cleared by the [place] FDA and has been authorized for detection and/or diagnosis of SARS-CoV-2 by FDA under an Emergency Use Authorization (EUA). This EUA will remain in effect (meaning this test can be used) for the duration of the COVID-19 declaration under Section 564(b)(1) of the Act, 21 U.S.C. section 360bbb-3(b)(1), unless the authorization is terminated or revoked.  Performed at [facility], [address] [place], [place] [address]   MRSA Next Gen by PCR, Nasal     Status: None   Collection Time: 04/23/21  6:55 AM   Specimen: Nasal Mucosa; Nasal Swab  Result Value Ref Range Status   MRSA by PCR Next Gen NOT DETECTED NOT DETECTED Final    Comment: (NOTE) The GeneXpert MRSA Assay (FDA approved for NASAL specimens only), is one component of a comprehensive MRSA colonization surveillance program. It is not intended to diagnose MRSA infection nor to guide or monitor treatment for MRSA infections. Test performance is not FDA approved in patients less than 45 years old. Performed at [facility], [address], [place], [place] [address]      CT abdomen done on April 21, 2021. IMPRESSION: Enlarged pancreatic head with adjacent peripancreatic soft tissue stranding. This is suspicious for focal pancreatitis,  although pancreatic carcinoma cannot definitely be excluded. Recommend correlation with serum lipase level, and continued follow-up by pancreatic protocol MRI or CT without and with contrast.   Right-sided pleural thickening and rounded opacity in posterior right lower lobe, new since 2016 exam. Rounded atelectasis is favored, however pulmonary infiltrate or neoplasm cannot definitely be excluded. Recommend clinical correlation, and short-term follow-up with chest CT or PET-CT.   Impression:    Patient is well-known to our practice as patient undergoes dialysis at [facility] dialysis under care of Dr. Murlean Iba Patient is on Tuesday Thursday Saturday schedule Patient has a target dry weight of 119.5 kg  1)  End-stage renal disease on dialysis.  Patient received dialysis on Saturday, UF goal 1.35 L achieved.  Patient reports cramping at end of treatment.  Next treatment scheduled for Tuesday  2)HTN with chronic kidney disease  Currently only receiving midodrine 20 mg as needed for low blood pressure   3)Anemia of chronic disease  CBC Latest Ref Rng & Units 04/23/2021 04/22/2021 04/21/2021  WBC 4.0 - 10.5 K/uL 6.6 8.9 9.3  Hemoglobin 12.0 - 15.0 g/dL 13.5 12.2 13.5  Hematocrit 36.0 - 46.0 % 44.1 40.4 44.5  Platelets 150 - 400 K/uL 295 301 294  Patient did receive Mircera as outpatient Hemoglobin at desired target  4) Secondary hyperparathyroidism -CKD Mineral-Bone Disorder/Renal failure associated hyperphosphatemia yes I is  Lab Results  Component Value Date   PTH 134 (H) 04/21/2017   CALCIUM 8.6 (L) 04/22/2021   CAION 0.81 (LL) 10/26/2020   PHOS 8.0 (H) 04/22/2021   Calcium within desired goal however phosphorus remains elevated.  Binders currently held in setting of pancreatitis.  We will continue to monitor  5)Pancreatitis  Currently tolerating clear liquid diet.  Pain control  Bonny Vanleeuwen 04/23/2021, 1:04 PM

## 2021-04-24 ENCOUNTER — Inpatient Hospital Stay: Payer: Medicare Other

## 2021-04-24 ENCOUNTER — Inpatient Hospital Stay: Payer: Self-pay

## 2021-04-24 ENCOUNTER — Encounter: Payer: Self-pay | Admitting: Internal Medicine

## 2021-04-24 DIAGNOSIS — Z992 Dependence on renal dialysis: Secondary | ICD-10-CM | POA: Diagnosis not present

## 2021-04-24 DIAGNOSIS — K859 Acute pancreatitis without necrosis or infection, unspecified: Secondary | ICD-10-CM | POA: Diagnosis not present

## 2021-04-24 DIAGNOSIS — J9611 Chronic respiratory failure with hypoxia: Secondary | ICD-10-CM | POA: Diagnosis not present

## 2021-04-24 DIAGNOSIS — J449 Chronic obstructive pulmonary disease, unspecified: Secondary | ICD-10-CM | POA: Diagnosis not present

## 2021-04-24 DIAGNOSIS — E11621 Type 2 diabetes mellitus with foot ulcer: Secondary | ICD-10-CM | POA: Diagnosis not present

## 2021-04-24 DIAGNOSIS — N186 End stage renal disease: Secondary | ICD-10-CM | POA: Diagnosis not present

## 2021-04-24 LAB — RENAL FUNCTION PANEL
Albumin: 3.5 g/dL (ref 3.5–5.0)
Anion gap: 14 (ref 5–15)
BUN: 62 mg/dL — ABNORMAL HIGH (ref 6–20)
CO2: 23 mmol/L (ref 22–32)
Calcium: 8.5 mg/dL — ABNORMAL LOW (ref 8.9–10.3)
Chloride: 91 mmol/L — ABNORMAL LOW (ref 98–111)
Creatinine, Ser: 11.56 mg/dL — ABNORMAL HIGH (ref 0.44–1.00)
GFR, Estimated: 4 mL/min — ABNORMAL LOW (ref >60)
Glucose, Bld: 117 mg/dL — ABNORMAL HIGH (ref 70–99)
Phosphorus: 10.4 mg/dL — ABNORMAL HIGH (ref 2.5–4.6)
Potassium: 5.6 mmol/L — ABNORMAL HIGH (ref 3.5–5.1)
Sodium: 128 mmol/L — ABNORMAL LOW (ref 135–145)

## 2021-04-24 LAB — GLUCOSE, CAPILLARY
Glucose-Capillary: 105 mg/dL — ABNORMAL HIGH (ref 70–99)
Glucose-Capillary: 107 mg/dL — ABNORMAL HIGH (ref 70–99)
Glucose-Capillary: 133 mg/dL — ABNORMAL HIGH (ref 70–99)
Glucose-Capillary: 156 mg/dL — ABNORMAL HIGH (ref 70–99)
Glucose-Capillary: 198 mg/dL — ABNORMAL HIGH (ref 70–99)

## 2021-04-24 LAB — HEPATITIS B CORE ANTIBODY, IGM: Hep B C IgM: NONREACTIVE

## 2021-04-24 LAB — HEPATITIS B SURFACE ANTIGEN: Hepatitis B Surface Ag: NONREACTIVE

## 2021-04-24 IMAGING — MR MR FOOT*R* W/O CM
8 series · 40 of 40 positions shown · non-contrast
Comparison: None.

CLINICAL DATA: Possible osteomyelitis of the right fourth
metatarsal. Prior fifth metatarsal amputation.

EXAM:
MRI OF THE RIGHT FOREFOOT WITHOUT CONTRAST
TECHNIQUE: Multiplanar, multisequence MR imaging of the right forefoot was
performed. No intravenous contrast was administered.

[Series 2: T1 · coronal · right · 3.0mm · 0.38mm/px · 6 of 45 slices shown (1 of 3)]
[im 1/45]
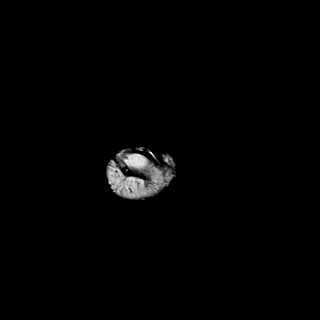
[im 9/45]
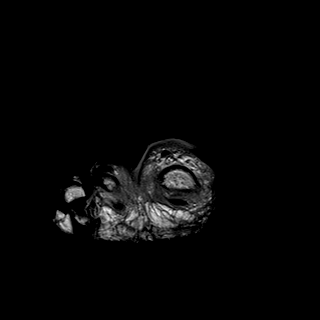
[im 18/45]
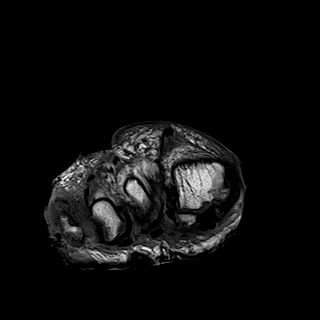
[im 27/45]
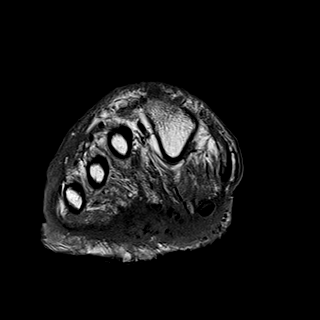
[im 36/45]
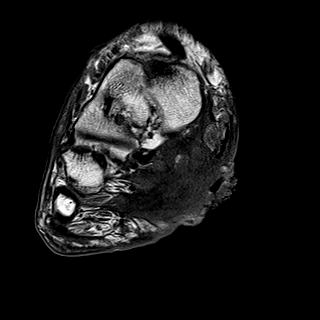
[im 45/45]
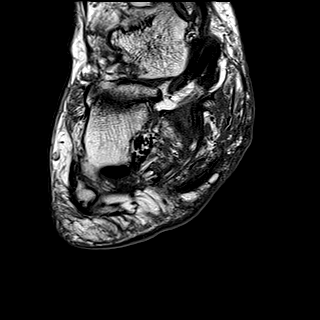

[Series 3: T2 · coronal · right · 3.0mm · 0.50mm/px · 7 of 45 slices shown (1 of 4)]
[im 1/45]
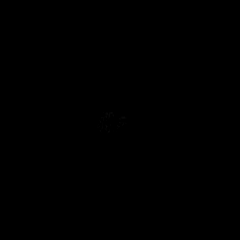
[im 8/45]
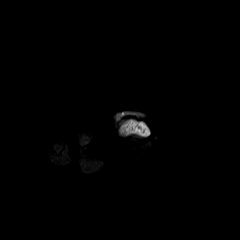
[im 15/45]
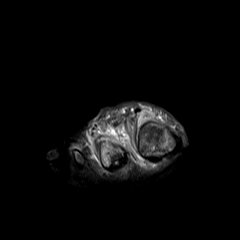
[im 23/45]
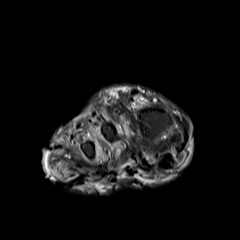
[im 30/45]
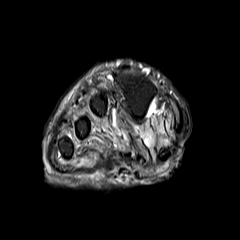
[im 37/45]
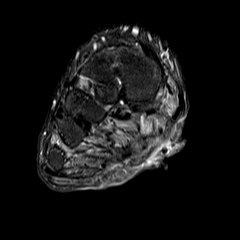
[im 45/45]
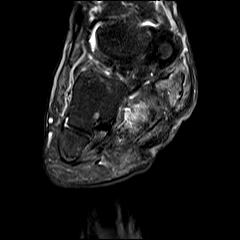

[Series 4: T2 · coronal · right · 3.0mm · 0.50mm/px · 7 of 45 slices shown (2 of 4)]
[im 1/45]
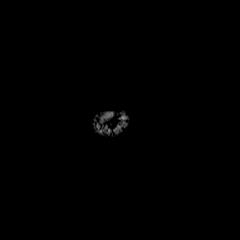
[im 8/45]
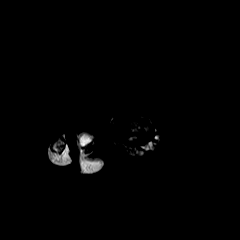
[im 15/45]
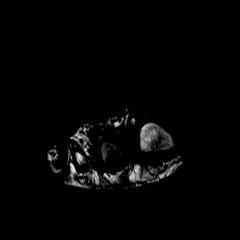
[im 23/45]
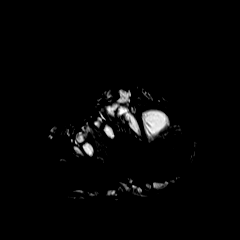
[im 30/45]
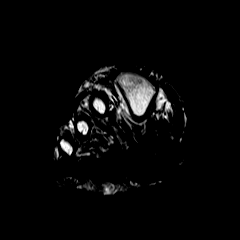
[im 37/45]
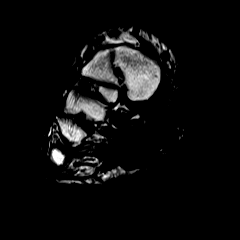
[im 45/45]
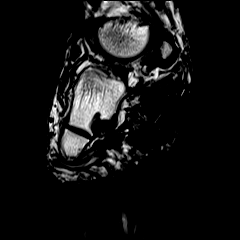

[Series 5: T1 · coronal · right · 3.0mm · 0.38mm/px · 7 of 45 slices shown (2 of 3)]
[im 1/45]
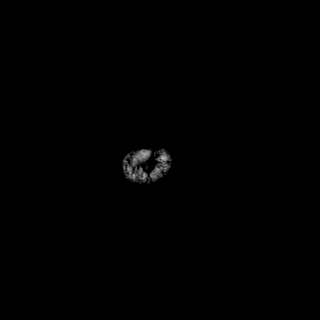
[im 8/45]
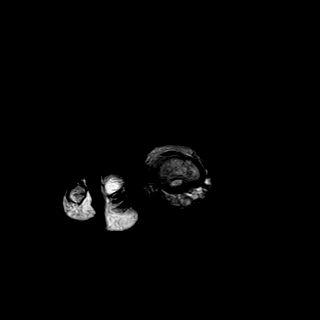
[im 15/45]
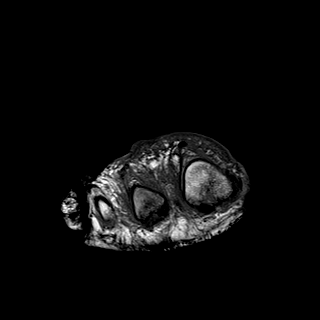
[im 23/45]
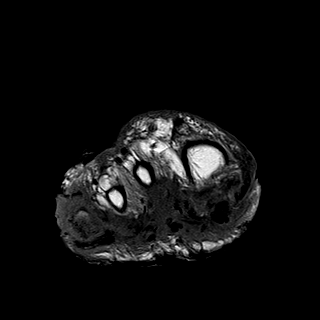
[im 30/45]
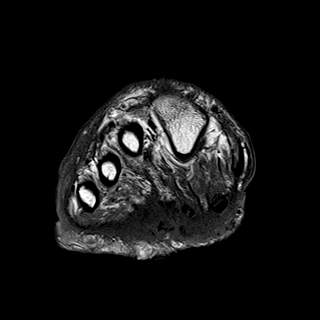
[im 37/45]
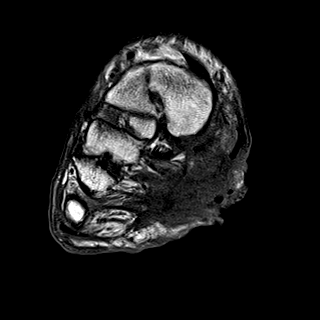
[im 45/45]
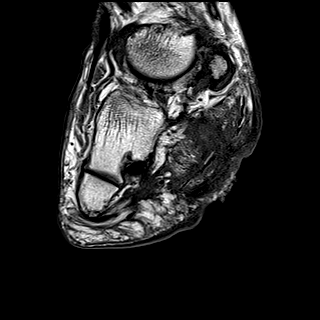

[Series 6: T1 · axial · right · 3.0mm · 0.70mm/px · z∈[-63,+8]mm · 3 of 19 slices shown (3 of 3)]
[im 1/19]
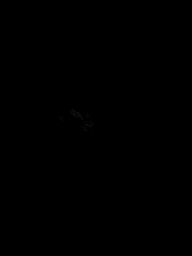
[im 10/19]
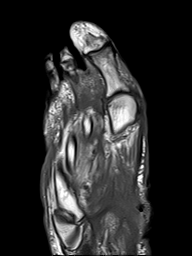
[im 19/19]
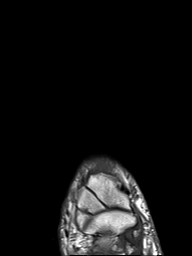

[Series 7: T2 · axial · right · 3.0mm · 0.70mm/px · z∈[-67,+8]mm · 3 of 20 slices shown (3 of 4)]
[im 1/20]
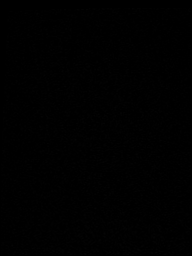
[im 10/20]
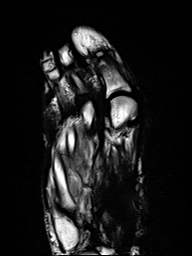
[im 20/20]
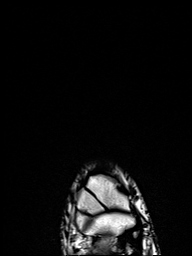

[Series 8: T2 · axial · right · 3.0mm · 0.70mm/px · z∈[-59,+8]mm · 3 of 18 slices shown (4 of 4)]
[im 1/18]
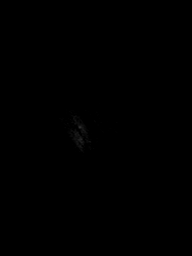
[im 9/18]
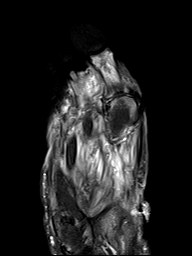
[im 18/18]
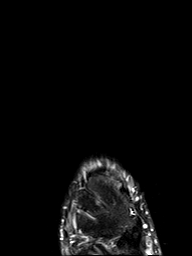

[Series 9: STIR · sagittal · right · 3.0mm · 0.62mm/px · 4 of 27 slices shown]
[im 1/27]
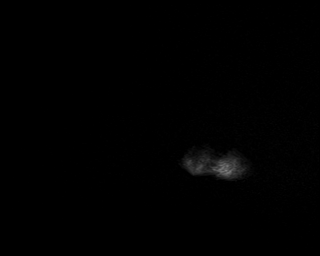
[im 9/27]
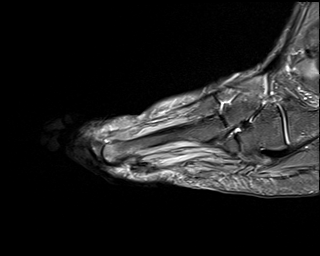
[im 18/27]
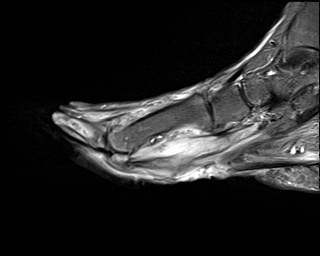
[im 27/27]
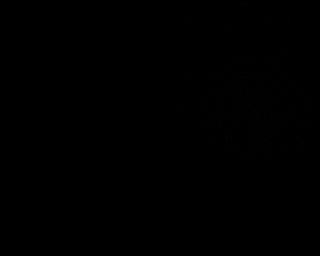

[40 of 40 positions shown; findings below may reference images not displayed]

FINDINGS: Bones/Joint/Cartilage

Prior amputation of the distal half of the fifth metatarsal and
phalanges. Soft tissue wound along the lateral forefoot overlying
the fourth MTP joint. Bone destruction and marrow edema within the
fourth metatarsal head and base of the fourth proximal phalanx
concerning for osteomyelitis.

Bone marrow edema in the third metatarsal head and base of the third
proximal phalanx which may be reactive versus osteomyelitis.

Severe bone marrow edema in the first proximal phalanx concerning
for osteomyelitis. Severe bone marrow edema in the second proximal
phalanx concerning for osteomyelitis.

Moderate osteoarthritis of the first MTP joint. Bone marrow edema in
the medial and lateral hallux sesamoids likely secondary to
arthritis.

Old healed second metatarsal neck fracture. Old healed third
metatarsal neck fracture.

Hallux valgus.  No joint effusion.

Ligaments

Collateral ligaments are intact.  Lisfranc ligament is intact.

Muscles and Tendons

Flexor, peroneal and extensor compartment tendons are intact.
Diffuse T2 hyperintense signal throughout the plantar musculature
likely neurogenic with atrophy.

Soft tissue
Large soft tissue wound along the plantar aspect of the medial
aspect of the midfoot. No soft tissue mass.
IMPRESSION: 1. Soft tissue wound along the lateral forefoot overlying the fourth
MTP joint. Osteomyelitis of the fourth metatarsal head and base of
the fourth proximal phalanx.
2. Bone marrow edema in the third metatarsal head and base of the
third proximal phalanx which may be reactive versus osteomyelitis.
3. Severe bone marrow edema in the first proximal phalanx and second
proximal phalanx concerning for osteomyelitis.
4. Moderate osteoarthritis of the first MTP joint. Bone marrow edema
in the medial and lateral hallux sesamoids likely secondary to
arthritis.

## 2021-04-24 MED ORDER — IOHEXOL 300 MG/ML  SOLN
100.0000 mL | Freq: Once | INTRAMUSCULAR | Status: AC | PRN
  Administered 2021-04-24: 17:00:00 100 mL via INTRAVENOUS

## 2021-04-24 MED ORDER — SODIUM CHLORIDE 0.9 % IV BOLUS
500.0000 mL | Freq: Once | INTRAVENOUS | Status: AC
  Administered 2021-04-24: 500 mL via INTRAVENOUS

## 2021-04-24 MED ORDER — MIDODRINE HCL 5 MG PO TABS
ORAL_TABLET | ORAL | Status: AC
  Filled 2021-04-24: qty 4

## 2021-04-24 NOTE — Progress Notes (Signed)
[name]  MRN: [id number]  DOB/AGE: 50-Jun-1972 50 y.o.  Primary Care Physician:[name], NP  Admit date: 04/21/2021  Chief Complaint:  Chief Complaint  Patient presents with   Abdominal Pain   Nausea    S-Pt presented on  04/21/2021 with  Chief Complaint  Patient presents with   Abdominal Pain   Nausea  . Patient is a 51 year old African-American female with a past medical history of end-stage renal disease, patient is on Tuesday Thursday Saturday schedule, diabetes mellitus, history of pancreatitis who came to the ER with chief complaint of abdominal pain.  Patient seen and evaluated during dialysis   HEMODIALYSIS FLOWSHEET:  Blood Flow Rate (mL/min): 350 mL/min Arterial Pressure (mmHg): -220 mmHg Venous Pressure (mmHg): 250 mmHg Transmembrane Pressure (mmHg): 50 mmHg Ultrafiltration Rate (mL/min): 0 mL/min Dialysate Flow Rate (mL/min): 500 ml/min Conductivity: Machine : 13.7 Conductivity: Machine : 13.7 Dialysis Fluid Bolus: Normal Saline Bolus Amount (mL): 100 mL  Continues to complain of foot pain, states pain not well managed   WUJ:[id number] from the symptoms mentioned above,there are no other symptoms referable to all systems reviewed.  Physical Exam: Vital signs in last 24 hours: Temp:  [97.7 F (36.5 C)-98.5 F (36.9 C)] 98.3 F (36.8 C) (02/28 1331) Pulse Rate:  [82-104] 92 (02/28 1331) Resp:  [16-53] 18 (02/28 1331) BP: (48-150)/(33-86) 113/62 (02/28 1330) SpO2:  [90 %-99 %] 97 % (02/28 1330) Weight:  [119.2 kg-121.8 kg] 119.2 kg (02/28 1329) Weight change:  Last BM Date : 04/21/21  Intake/Output from previous day: 02/27 0701 - 02/28 0700 In: 360 [P.O.:360] Out: -  Total I/O In: -  Out: 1974 [Other:1974]   Physical Exam:  General- pt is awake,alert, oriented to time place and person  Resp- No acute REsp distress, CTA B/L NO Rhonchi  CVS- S1S2 regular in rate and rhythm  GIT- BS+, soft, minimal tenderness  , Non  distended  EXT- trace LE Edema,  No Cyanosis right foot dressing  Access-  LUE AVF  Lab Results:  CBC  Recent Labs    04/22/21 0354 04/23/21 0653  WBC 8.9 6.6  HGB 12.2 13.5  HCT 40.4 44.1  PLT 301 295     BMET  Recent Labs    04/23/21 0515 04/24/21 0429  NA 130* 128*  K 5.5* 5.6*  CL 90* 91*  CO2 22 23  GLUCOSE 113* 117*  BUN 55* 62*  CREATININE 10.20* 11.56*  CALCIUM 8.8* 8.5*       Most recent Creatinine trend  Lab Results  Component Value Date   CREATININE 11.56 (H) 04/24/2021   CREATININE 10.20 (H) 04/23/2021   CREATININE 8.61 (H) 04/22/2021       MICRO   Recent Results (from the past 240 hour(s))  Resp Panel by RT-PCR (Flu A&B, Covid) Nasopharyngeal Swab     Status: None   Collection Time: 04/21/21 12:16 PM   Specimen: Nasopharyngeal Swab; Nasopharyngeal(NP) swabs in vial transport medium  Result Value Ref Range Status   SARS Coronavirus 2 by RT PCR NEGATIVE NEGATIVE Final    Comment: (NOTE) SARS-CoV-2 target nucleic acids are NOT DETECTED.  The SARS-CoV-2 RNA is generally detectable in upper respiratory specimens during the acute phase of infection. The lowest concentration of SARS-CoV-2 viral copies this assay can detect is 138 copies/mL. A negative result does not preclude SARS-Cov-2 infection and should not be used as the sole basis for treatment or other patient management decisions. A negative result may occur with  improper  specimen collection/handling, submission of specimen other than nasopharyngeal swab, presence of viral mutation(s) within the areas targeted by this assay, and inadequate number of viral copies(<138 copies/mL). A negative result must be combined with clinical observations, patient history, and epidemiological information. The expected result is Negative.  Fact Sheet for Patients:  EntrepreneurPulse.com.au  Fact Sheet for Healthcare Providers:   IncredibleEmployment.be  This test is no t yet approved or cleared by the [place] FDA and  has been authorized for detection and/or diagnosis of SARS-CoV-2 by FDA under an Emergency Use Authorization (EUA). This EUA will remain  in effect (meaning this test can be used) for the duration of the COVID-19 declaration under Section 564(b)(1) of the Act, 21 U.S.C.section 360bbb-3(b)(1), unless the authorization is terminated  or revoked sooner.       Influenza A by PCR NEGATIVE NEGATIVE Final   Influenza B by PCR NEGATIVE NEGATIVE Final    Comment: (NOTE) The Xpert Xpress SARS-CoV-2/FLU/RSV plus assay is intended as an aid in the diagnosis of influenza from Nasopharyngeal swab specimens and should not be used as a sole basis for treatment. Nasal washings and aspirates are unacceptable for Xpert Xpress SARS-CoV-2/FLU/RSV testing.  Fact Sheet for Patients: EntrepreneurPulse.com.au  Fact Sheet for Healthcare Providers: IncredibleEmployment.be  This test is not yet approved or cleared by the [place] FDA and has been authorized for detection and/or diagnosis of SARS-CoV-2 by FDA under an Emergency Use Authorization (EUA). This EUA will remain in effect (meaning this test can be used) for the duration of the COVID-19 declaration under Section 564(b)(1) of the Act, 21 U.S.C. section 360bbb-3(b)(1), unless the authorization is terminated or revoked.  Performed at [facility], [address] [place], [place] [address]   MRSA Next Gen by PCR, Nasal     Status: None   Collection Time: 04/23/21  6:55 AM   Specimen: Nasal Mucosa; Nasal Swab  Result Value Ref Range Status   MRSA by PCR Next Gen NOT DETECTED NOT DETECTED Final    Comment: (NOTE) The GeneXpert MRSA Assay (FDA approved for NASAL specimens only), is one component of a comprehensive MRSA colonization surveillance program. It is not intended to  diagnose MRSA infection nor to guide or monitor treatment for MRSA infections. Test performance is not FDA approved in patients less than 50 years old. Performed at [facility], [address], [place], [place] [address]      CT abdomen done on April 21, 2021. IMPRESSION: Enlarged pancreatic head with adjacent peripancreatic soft tissue stranding. This is suspicious for focal pancreatitis, although pancreatic carcinoma cannot definitely be excluded. Recommend correlation with serum lipase level, and continued follow-up by pancreatic protocol MRI or CT without and with contrast.   Right-sided pleural thickening and rounded opacity in posterior right lower lobe, new since 2016 exam. Rounded atelectasis is favored, however pulmonary infiltrate or neoplasm cannot definitely be excluded. Recommend clinical correlation, and short-term follow-up with chest CT or PET-CT.   Impression:    Patient is well-known to our practice as patient undergoes dialysis at [facility] dialysis under care of Dr. Murlean Iba Patient is on Tuesday Thursday Saturday schedule Patient has a target dry weight of 119.5 kg  1)  End-stage renal disease on dialysis.  Receiving dialysis with UF goal 3-3.5L as tolerated. Monitoring low BP during dialysis and given Midodrine 20mg  po once. Requested off dialysis 18 mins early UF goal 2.4L. Next treatment scheduled for Thursday.  2)HTN with chronic kidney disease  Currently only  receiving midodrine 20 mg as needed for low blood pressure.    3)Anemia of chronic disease  CBC Latest Ref Rng & Units 04/23/2021 04/22/2021 04/21/2021  WBC 4.0 - 10.5 K/uL 6.6 8.9 9.3  Hemoglobin 12.0 - 15.0 g/dL 13.5 12.2 13.5  Hematocrit 36.0 - 46.0 % 44.1 40.4 44.5  Platelets 150 - 400 K/uL 295 301 294    Patient did receive Mircera as outpatient Hemoglobin at goal  4) Secondary hyperparathyroidism -CKD Mineral-Bone Disorder/Renal failure associated  hyperphosphatemia   Lab Results  Component Value Date   PTH 134 (H) 04/21/2017   CALCIUM 8.5 (L) 04/24/2021   CAION 0.81 (LL) 10/26/2020   PHOS 10.4 (H) 04/24/2021   Phosphorus remains elevated.  Binders currently held in setting of pancreatitis.  We will continue to monitor  5)Pancreatitis  Diet advanced to soft, tolerating well.   Chalfont Maddisen Vought 04/24/2021, 1:38 PM

## 2021-04-24 NOTE — Progress Notes (Signed)
PROGRESS NOTE  [name] WVP:[id number] DOB: 1970/10/03 DOA: 04/21/2021 PCP: [name], NP  Brief History    [name] is a 51 y.o. female with medical history significant of HTN, COPD, chronic respiratory failure with hypoxia and hypercapnia on 3 L of oxygen at baseline, ESRD on HD(TTS),  DM type II, and gangrene with  associatedosteomyelitis status post fifth ray amputation of the right foot presents with complaints of 3-4 day of abdominal pain.  Describes it as a crampy on epigastric abdominal pain with associated symptoms of nausea and vomiting.  Initially she thought symptoms were possibly secondary to a stomach flu that has been going around and subsequently question if she was constipated.  She took stool softeners and had a bowel movement but symptoms did not improve.  Notes that pain would radiate to her back.  She chronically reports having intermittent chills, shortness of breath, wheezing, and productive cough.  However, she had noticed that her sputum had started to change colors.  Her nephrologist had placed her on Z-Pak 2 days ago but she was only able to keep the first dose down.  She had missed hemodialysis on Thursday, and today when she went only was able to tolerate about a hour. She does not drink alcohol.  Patient reported symptoms felt similar, but worse than prior bout of pancreatitis and she did not require hospitalization.  That was thought secondary to one of her medications like Ozempic.   On admission to the emergency department patient was noted to be afebrile with blood pressures elevated up to 155/88, and all other vital signs maintained.  Labs significant for sodium 128, potassium 5.6 CO2 19, BUN 73, creatinine 10.95, anion gap 22, alkaline phosphatase 357, lipase 439, and total protein 8.9. CT scan of the abdomen pelvis significant for concern for focal pancreatitis along with right-sided pleural thickening and rounded opacity noted in the posterior right  lower lobe favoring atelectasis.  GI was consulted today due to the patient's ongoing complaints of severe abdominal pain. I appreciate their assistance.  Consultants  Nephrology Gastroenterology  Procedures  Dialysis  Antibiotics   Anti-infectives (From admission, onward)    None      Subjective  The patient is resting comfortably. She is complaining of continued severe epigastric abdominal pain.  Objective   Vitals:  Vitals:   04/24/21 2000 04/24/21 2002  BP:    Pulse:    Resp:    Temp:    SpO2: 92% 92%    Exam:  Constitutional:  The patient is awake, alert, and oriented x 3. No acute distress. Respiratory:  No increased work of breathing. No wheezes, rales, or rhonchi No tactile fremitus Cardiovascular:  Regular rate and rhythm No murmurs, ectopy, or gallups. No lateral PMI. No thrills. Abdomen:  Abdomen is soft, and non-distended Positive for epigastric abdominal pain. No hernias, masses, or organomegaly Normoactive bowel sounds.  Musculoskeletal:  No cyanosis, clubbing, or edema Skin:  No rashes, lesions, ulcers palpation of skin: no induration or nodules Neurologic:  CN 2-12 intact Sensation all 4 extremities intact Psychiatric:  Mental status Mood, affect appropriate Orientation to person, place, time  judgment and insight appear intact   I have personally reviewed the following:   Today's Data  Vitals  Lab Data  BMP Mg Phos CRP CBC  Micro Data    Imaging  CT abdomen and pelvis  Cardiology Data  EKG  Other Data  Vitals  Scheduled Meds:  arformoterol  15 mcg  Nebulization Q12H   budesonide  0.5 mg Nebulization BID   Chlorhexidine Gluconate Cloth  6 each Topical Q0600   citalopram  20 mg Oral QHS   ferric citrate  210 mg Oral TID WC   heparin  5,000 Units Subcutaneous Q8H   insulin aspart  0-15 Units Subcutaneous TID WC   insulin glargine-yfgn  18 Units Subcutaneous QHS   loratadine  10 mg Oral QHS   midodrine        multivitamin  1 tablet Oral QHS   pantoprazole  40 mg Oral QHS   povidone-iodine   Topical Daily   revefenacin  175 mcg Nebulization Daily   sodium chloride flush  3 mL Intravenous Q12H   Continuous Infusions:  Principal Problem:   Pancreatitis Active Problems:   Obesity, Class III, BMI 40-49.9 (morbid obesity) (HCC)   Type 2 diabetes mellitus with ESRD (end-stage renal disease) ([place])   COPD with chronic bronchitis (HCC)   End-stage renal disease on hemodialysis ([place])   Chronic respiratory failure with hypoxia (HCC)   Diabetic foot ulcer (HCC)   Hyponatremia   Metabolic acidosis, increased anion gap   GERD (gastroesophageal reflux disease)   LOS: 3 days   A & P  Assessment and Plan: Assessment and Plan: * Pancreatitis- (present on admission) Patient presents with a 3 to 4-day history of epigastric abdominal pain with reports of radiation to the back unable to tolerate any significant p.o. intake.  Lipase elevated at 439.  Imaging significant for concern for focal pancreatitis.  Previous episode of pancreatitis noted in 05/2020, but thought possibly related to [place] which appears to have been discontinued.  She does not drink alcohol.  -Admit to a medical telemetry bed -Triglycerides are 151 -Clear liquid diet and advance as tolerated -Antiemetics as needed -Fentanyl IV as needed for pain -Hold empirically giving IV fluids unless patient becomes hypotensive -Pt continues to complain of severe abdominal pain. -GI asssistance is appreciated   GERD (gastroesophageal reflux disease)- (present on admission) -Substitution of Protonix 40 mg IV for omeprazole  Metabolic acidosis, increased anion gap  Resolved after HD. Acute.  On admission CO2 19 and anion gap 22.  Glucose levels were within normal limits.  Suspect secondary to missed hemodialysis sessions. -Continue to monitor  Hyponatremia- (present on admission) Acute on chronic. Resolved. Sodium 130. Monitor.      Diabetic foot ulcer ([place])- (present on admission) Patient has a known history of a diabetic foot wound previously diagnosed with gangrene and associated osteomyelitis requiring fifth ray amputation of the foot 09/2020.  Patient appears to have been previously recommended  to have amputation of the foot but and declined.  Right foot toes appear to be chronic in appearance. -Check ESR and CRP -Check x-rays of her foot -Wound care consulted  -Reassess and determine need of antibiotics and/or further work-up and evaluate -X-ray of the foot was performed. It demonstrated a faint lucency in the head of the right fourth metatarsal. This may represent osteomyelitis. MRI has been ordered.   Chronic respiratory failure with hypoxia (HCC)- (present on admission) Chronic respiratory failure on 3 L nasal cannula oxygen 24/7 with O2 saturations currently maintained.  Patient had recently had a sleep study done in the outpatient setting, but has not heard back regarding results.  CT imaging did note concern for right-sided pleural effusion. -Continue to monitor O2 saturations -Continue nasal cannula oxygen 3 L and adjust as needed -Patient is currently at baseline O2 requirements at 3L by  to  maintain saturation of 92% -Follow-up chest x-ray -Fluid management with dialysis -Follow-up with pulmonology in outpatient setting regarding sleep study   End-stage renal disease on hemodialysis [facility]) [name] last full hemodialysis session was on Pt underwent dialysis today.  COPD with chronic bronchitis ([place])- (present on admission) Patient reports chronically having cough which is of breath wheezing.  On physical exam no significant wheezing appreciated at this time but she had been started on azithromycin due to reports of change in sputum. -Complete course of azithromycin -Continue scheduled home breathing treatments and albuterol nebs as needed for shortness of breath/wheezing   Type 2 diabetes  mellitus with ESRD (end-stage renal disease) ([place])- (present on admission) Admission into the emergency department patient blood glucose was noted to be 125.  Last hemoglobin A1c 8.9 in 09/2020.  On insulin regimen includes NovoLog insulin 0 to 15 units with meals and Tresiba 43 units nightly. -Hypoglycemic protocol  -Reduced pharmacy substitution of Tresiba to 23 units nightly -CBGs before every meal with moderate SSI -Adjust insulin regimen as needed Glucoses have been 105-198 in the last 24 hours.    Obesity, Class III, BMI 40-49.9 (morbid obesity) ([place])- (present on admission) BMI 44.46 kg/m    I have seen and examined this patient myself. I have spent 34 minutes in his evaluation and care.  DVT prophylaxis: heparin Code Status: Full Code Family Communication: None available Disposition Plan: tbd    [name] Knoth, DO [facility] Direct contact: see [email or web address]  7PM-7AM contact night coverage as above 04/24/2021, 9:19 PM  LOS: 1 day

## 2021-04-24 NOTE — Consult Note (Signed)
[name] Darby, MD [address]  [place]  [place], [place] [address]  Main: [phone]  Fax: [phone] Pager: [phone]   Consultation  Referring Provider:     No ref. provider found Primary Care Physician:  [name], NP Primary Gastroenterologist: Althia Forts        Reason for Consultation:     Acute pancreatitis  Date of Admission:  04/21/2021 Date of Consultation:  04/24/2021         HPI:   [name] is a 51 y.o. female end-stage renal disease on hemodialysis is admitted on 2/25 secondary to epigastric pain associated with nausea, vomiting and dry heaving.  She is found to have serum lipase of 439, CT abdomen and pelvis without contrast on admission revealed enlarged pancreatic head with adjacent peripancreatic soft tissue stranding suspicious for focal pancreatitis, although pancreatic carcinoma cannot definitely be excluded.  Her LFTs were normal, no evidence of cholelithiasis or biliary dilation.  GI is consulted today because of ongoing epigastric pain.  When interviewed the patient, her epigastric pain is intermittent but when it comes it is severe.  She is willing to try some solid food today.  Patient had an episode of pancreatitis in 05/2020, which was thought to be related to [place] and has been discontinued.  Patient does not drink alcohol.  Her serum triglycerides levels are normal.  Patient is currently undergoing dialysis when I saw her   NSAIDs: None  Antiplts/Anticoagulants/Anti thrombotics: None  GI Procedures: None  Past Medical History:  Diagnosis Date   (HFpEF) heart failure with preserved ejection fraction ([place])    a. 11/2014 Echo: EF 50-55%, Gr1 DD; b. 06/2016 Echo: EF 60-65%, no rwma, mod dil LA, nl RV, triv eff.   Anxiety    Asthma    Bronchitis    CHF (congestive heart failure) (HCC)    Chronic cough    Chronic respiratory failure with hypoxia and hypercapnia ([place])    a. Home O2.  (2-4L)   CKD (chronic kidney disease),  stage III (HCC)    Depression    Diabetes mellitus without complication (HCC)    Diverticulosis    Dyspnea    Dysrhythmia    per pt, no arrythmias   Environmental and seasonal allergies    Extreme obesity with alveolar hypoventilation (HCC)    Hypertension    Iron deficiency anemia    a. chronic blood loss->heavy menses.   Kidney failure    dialysis tues/thur/sat   ... left arm shunt   Orthopnea    Oxygen dependent    3l/min   Pericardial effusion    a. 11/2014 CT Chest: Mod effusion; b. 11/2014 Echo: small to mod circumferential effusion. No hemodynamic compromise.   Pneumonia    history   Poorly controlled diabetes mellitus ([place])    a. 11/2014 A1c 13.2.   Swelling of both lower extremities     Past Surgical History:  Procedure Laterality Date   A/V FISTULAGRAM Left 06/09/2017   Procedure: A/V FISTULAGRAM;  Surgeon: Algernon Huxley, MD;  Location: [facility];  Service: Cardiovascular;  Laterality: Left;   A/V FISTULAGRAM Left 06/16/2017   Procedure: A/V FISTULAGRAM;  Surgeon: Algernon Huxley, MD;  Location: [facility];  Service: Cardiovascular;  Laterality: Left;   A/V FISTULAGRAM Left 01/28/2018   Procedure: A/V FISTULAGRAM;  Surgeon: Algernon Huxley, MD;  Location: [facility];  Service: Cardiovascular;  Laterality: Left;   AMPUTATION TOE  Right 10/21/2020   Procedure: AMPUTATION TOE-5th Digit Amputation Right Foot;  Surgeon: Edrick Kins, DPM;  Location: [facility];  Service: Podiatry;  Laterality: Right;   AV FISTULA PLACEMENT Left 04/25/2017   Procedure: ARTERIOVENOUS (AV) FISTULA CREATION;  Surgeon: Algernon Huxley, MD;  Location: [facility];  Service: Vascular;  Laterality: Left;   CATARACT EXTRACTION W/PHACO Left 01/21/2018   Procedure: CATARACT EXTRACTION PHACO AND INTRAOCULAR LENS PLACEMENT (IOC);  Surgeon: Leandrew Koyanagi, MD;  Location: [facility];  Service: Ophthalmology;  Laterality: Left;  [place] 01:55.5 CDE 9.05 EAUP 13.9 Fluid Pack Lot # [id number]    CESAREAN SECTION     times 3   DIALYSIS/PERMA CATHETER INSERTION Right 04/04/2017   Procedure: DIALYSIS/PERMA CATHETER INSERTION;  Surgeon: Katha Cabal, MD;  Location: [facility];  Service: Cardiovascular;  Laterality: Right;   DIALYSIS/PERMA CATHETER REMOVAL N/A 09/17/2017   Procedure: DIALYSIS/PERMA CATHETER REMOVAL;  Surgeon: Algernon Huxley, MD;  Location: [facility];  Service: Cardiovascular;  Laterality: N/A;   INCISION AND DRAINAGE Right 10/26/2020   Procedure: INCISION AND DRAINAGE;  Surgeon: [name], DPM;  Location: [facility];  Service: Podiatry;  Laterality: Right;   INSERTION OF DIALYSIS CATHETER N/A 04/16/2017   Procedure: INSERTION OF DIALYSIS PERM CATHETER;  Surgeon: Algernon Huxley, MD;  Location: [facility];  Service: Vascular;  Laterality: N/A;   LOWER EXTREMITY ANGIOGRAPHY Right 10/18/2020   Procedure: Lower Extremity Angiography;  Surgeon: Algernon Huxley, MD;  Location: [facility];  Service: Cardiovascular;  Laterality: Right;    Prior to Admission medications   Medication Sig Start Date End Date Taking? Authorizing Provider  acetaminophen (TYLENOL) 500 MG tablet Take 1,000 mg by mouth every 6 (six) hours as needed for mild pain, fever or headache.   Yes [provider]  albuterol (PROVENTIL) (2.5 MG/3ML) 0.083% nebulizer solution Take 3 mLs (2.5 mg total) by nebulization every 6 (six) hours as needed for wheezing or shortness of breath. 02/28/21  Yes Tyler Pita, MD  albuterol (VENTOLIN HFA) 108 (90 Base) MCG/ACT inhaler INHALE 2 PUFFS BY MOUTH EVERY 4 HOURS AS NEEDED FOR WHEEZING FOR SHORTNESS OF BREATH 04/17/21  Yes Tyler Pita, MD  aspirin EC 81 MG tablet Take 81 mg by mouth daily.    Yes [provider]  azithromycin (ZITHROMAX) 250 MG tablet Take by mouth. 04/17/21  Yes [provider]  b complex-vitamin c-folic acid (NEPHRO-VITE) 0.8 MG TABS tablet Take 1 tablet by mouth at bedtime.   Yes [provider]  budesonide (PULMICORT) 0.25 MG/2ML nebulizer solution Take 2 mLs (0.25 mg total) by nebulization 2 (two) times daily. 01/24/21  Yes Tyler Pita, MD  budesonide (PULMICORT) 0.5 MG/2ML nebulizer solution Take by nebulization. 04/17/21  Yes [provider]  Cholecalciferol 100 MCG (4000 UT) CAPS Take 1 capsule by mouth once a week.   Yes [provider]  citalopram (CELEXA) 20 MG tablet Take 20 mg by mouth at bedtime. 03/08/21  Yes [provider]  clindamycin (CLEOCIN T) 1 % lotion Apply 1 application topically daily. 09/19/20  Yes [provider]  docusate sodium (COLACE) 100 MG capsule Take 100 mg by mouth daily.   Yes [provider]  ferric citrate (AURYXIA) 1 GM 210 MG(Fe) tablet Take 210 mg by mouth 3 (three) times daily with meals.   Yes [provider]  formoterol (PERFOROMIST) 20 MCG/2ML nebulizer solution  01/19/21  Yes [provider]  insulin  aspart (NOVOLOG) 100 UNIT/ML injection Inject 0-15 Units into the skin 3 (three) times daily with meals. 04/26/17  Yes Epifanio Lesches, MD  insulin degludec (TRESIBA) 100 UNIT/ML FlexTouch Pen Inject 43 Units into the skin daily at 10 pm. 11/08/20  Yes Kathie Dike, MD  lactulose (CHRONULAC) 10 GM/15ML solution Take 30 mLs (20 g total) by mouth daily as needed for severe constipation. 04/26/17  Yes Epifanio Lesches, MD  levocetirizine (XYZAL) 5 MG tablet Take 5 mg by mouth at bedtime. 05/25/20  Yes [provider]  lidocaine-prilocaine (EMLA) cream Apply topically as needed (For access for dialysis).   Yes [provider]  LINZESS 290 MCG CAPS capsule Take 290 mcg by mouth every morning. 10/10/20  Yes [provider]  midodrine (PROAMATINE) 10 MG tablet Take 20 mg by mouth daily as needed. 08/22/20  Yes [provider]  Omega-3 Fatty Acids ([employer] NATURAL FISH OIL) 1000 MG CAPS 2 mL 2 TIMES DAILY (route: inhalation) 12/07/20  Yes  [provider]  omeprazole (PRILOSEC) 20 MG capsule Take 20 mg by mouth daily. 08/06/20  Yes [provider]  revefenacin (YUPELRI) 175 MCG/3ML nebulizer solution  01/19/21  Yes [provider]  insulin aspart (NOVOLOG) 100 UNIT/ML injection Inject 0-5 Units into the skin at bedtime. Patient not taking: Reported on 04/21/2021 04/26/17   Epifanio Lesches, MD  metoprolol tartrate (LOPRESSOR) 25 MG tablet Take 1 tablet (25 mg total) by mouth 2 (two) times daily. Patient not taking: Reported on 04/21/2021 11/10/20   Kathie Dike, MD  oxyCODONE-acetaminophen (PERCOCET) 5-325 MG tablet Take 1 tablet by mouth every 4 (four) hours as needed for severe pain. Patient not taking: Reported on 04/21/2021 11/08/20   Kathie Dike, MD  OXYGEN Inhale 2-4 L into the lungs.    [provider]    Current Facility-Administered Medications:    acetaminophen (TYLENOL) tablet 650 mg, 650 mg, Oral, Q6H PRN, 650 mg at 04/23/21 0830 **OR** acetaminophen (TYLENOL) suppository 650 mg, 650 mg, Rectal, Q6H PRN, Tamala Julian, Rondell A, MD   albuterol (PROVENTIL) (2.5 MG/3ML) 0.083% nebulizer solution 2.5 mg, 2.5 mg, Nebulization, Q6H PRN, Tamala Julian, Rondell A, MD, 2.5 mg at 04/24/21 0616   arformoterol (BROVANA) nebulizer solution 15 mcg, 15 mcg, Nebulization, Q12H, Smith, Rondell A, MD, 15 mcg at 04/24/21 0700   budesonide (PULMICORT) nebulizer solution 0.5 mg, 0.5 mg, Nebulization, BID, Smith, Rondell A, MD, 0.5 mg at 04/24/21 0700   Chlorhexidine Gluconate Cloth 2 % PADS 6 each, 6 each, Topical, Q0600, Norval Morton, MD, 6 each at 04/24/21 0505   citalopram (CELEXA) tablet 20 mg, 20 mg, Oral, QHS, Smith, Rondell A, MD   ferric citrate (AURYXIA) tablet 210 mg, 210 mg, Oral, TID WC, Smith, Rondell A, MD, 210 mg at 04/23/21 1656   heparin injection 5,000 Units, 5,000 Units, Subcutaneous, Q8H, Smith, Rondell A, MD, 5,000 Units at 04/24/21 1432   HYDROmorphone (DILAUDID) injection 1 mg, 1 mg,  Intravenous, Q3H PRN, Swayze, Ava, DO, 1 mg at 04/24/21 1144   insulin aspart (novoLOG) injection 0-15 Units, 0-15 Units, Subcutaneous, TID WC, Smith, Rondell A, MD, 2 Units at 04/24/21 0349   insulin glargine-yfgn (SEMGLEE) injection 18 Units, 18 Units, Subcutaneous, QHS, Swayze, Ava, DO   lactulose (CHRONULAC) 10 GM/15ML solution 20 g, 20 g, Oral, Daily PRN, Tamala Julian, Rondell A, MD   lidocaine-prilocaine (EMLA) cream, , Topical, PRN, Tamala Julian, Rondell A, MD   loratadine (CLARITIN) tablet 10 mg, 10 mg, Oral, QHS, Smith, Rondell A, MD, 10 mg  at 04/23/21 2119   midodrine (PROAMATINE) 5 MG tablet, , , ,    midodrine (PROAMATINE) tablet 20 mg, 20 mg, Oral, Daily PRN, Tamala Julian, Rondell A, MD, 20 mg at 04/24/21 1055   multivitamin (RENA-VIT) tablet 1 tablet, 1 tablet, Oral, QHS, Smith, Rondell A, MD   ondansetron (ZOFRAN) injection 4 mg, 4 mg, Intravenous, Q6H PRN, Fuller Plan A, MD, 4 mg at 04/24/21 4163   oxyCODONE (Oxy IR/ROXICODONE) immediate release tablet 5-10 mg, 5-10 mg, Oral, Q4H PRN, Swayze, Ava, DO, 10 mg at 04/24/21 1432   pantoprazole (PROTONIX) EC tablet 40 mg, 40 mg, Oral, QHS, Benita Gutter, RPH, 40 mg at 04/23/21 2119   povidone-iodine 10 % swab, , Topical, Daily, Swayze, Ava, DO, Given at 04/23/21 8453   revefenacin (YUPELRI) nebulizer solution 175 mcg, 175 mcg, Nebulization, Daily, Tamala Julian, Rondell A, MD, 175 mcg at 04/24/21 0700   sodium chloride flush (NS) 0.9 % injection 3 mL, 3 mL, Intravenous, Q12H, Tamala Julian, Rondell A, MD, 3 mL at 04/24/21 0753  Family History  Problem Relation Age of Onset   Diabetes Mother    Hypertension Mother    Hypertension Father    Diabetes Father    Hypertension Other    Diabetes Other    Breast cancer Maternal Aunt 62   Breast cancer Maternal Aunt 50     Social History   Tobacco Use   Smoking status: Never   Smokeless tobacco: Never  Vaping Use   Vaping Use: Never used  Substance Use Topics   Alcohol use: Not Currently    Comment: occasional    Drug use: Yes    Types: Marijuana    Allergies as of 04/21/2021 - Review Complete 04/21/2021  Allergen Reaction Noted   Dust mite extract Shortness Of Breath and Itching 11/10/2016   Mold extract [trichophyton] Shortness Of Breath and Itching 11/10/2016   Pollen extract Shortness Of Breath    Sulfa antibiotics Itching 12/15/2014   Feraheme [ferumoxytol] Itching 02/17/2015    Review of Systems:    All systems reviewed and negative except where noted in HPI.   Physical Exam:  Vital signs in last 24 hours: Temp:  [97.7 F (36.5 C)-98.5 F (36.9 C)] 98 F (36.7 C) (02/28 1415) Pulse Rate:  [82-104] 93 (02/28 1415) Resp:  [16-53] 16 (02/28 1415) BP: (48-150)/(33-86) 104/78 (02/28 1415) SpO2:  [90 %-99 %] 96 % (02/28 1415) Weight:  [119.2 kg-121.8 kg] 119.2 kg (02/28 1329) Last BM Date : 04/21/21 General:   Pleasant, cooperative, ill-appearing in NAD Head:  Normocephalic and atraumatic. Eyes:   No icterus.   Conjunctiva pink. PERRLA. Ears:  Normal auditory acuity. Neck:  Supple; no masses or thyroidomegaly Lungs: Respirations even and unlabored. Lungs clear to auscultation bilaterally.   No wheezes, crackles, or rhonchi.  Heart:  Regular rate and rhythm;  Without murmur, clicks, rubs or gallops Abdomen:  Soft, nondistended, nontender. Normal bowel sounds. No appreciable masses or hepatomegaly.  No rebound or guarding.  Rectal:  Not performed. Neurologic:  Alert and oriented x3;  grossly normal neurologically. Psych:  Alert and cooperative. Normal affect.  LAB RESULTS: CBC Latest Ref Rng & Units 04/23/2021 04/22/2021 04/21/2021  WBC 4.0 - 10.5 K/uL 6.6 8.9 9.3  Hemoglobin 12.0 - 15.0 g/dL 13.5 12.2 13.5  Hematocrit 36.0 - 46.0 % 44.1 40.4 44.5  Platelets 150 - 400 K/uL 295 301 294    BMET BMP Latest Ref Rng & Units 04/24/2021 04/23/2021 04/22/2021  Glucose 70 - 99 mg/dL  117(H) 113(H) 107(H)  BUN 6 - 20 mg/dL 62(H) 55(H) 47(H)  Creatinine 0.44 - 1.00 mg/dL 11.56(H) 10.20(H)  8.61(H)  Sodium 135 - 145 mmol/L 128(L) 130(L) 130(L)  Potassium 3.5 - 5.1 mmol/L 5.6(H) 5.5(H) 4.5  Chloride 98 - 111 mmol/L 91(L) 90(L) 90(L)  CO2 22 - 32 mmol/L 23 22 26   Calcium 8.9 - 10.3 mg/dL 8.5(L) 8.8(L) 8.6(L)    LFT Hepatic Function Latest Ref Rng & Units 04/24/2021 04/23/2021 04/22/2021  Total Protein 6.5 - 8.1 g/dL - - -  Albumin 3.5 - 5.0 g/dL 3.5 3.5 3.8  AST 15 - 41 U/L - - -  ALT 0 - 44 U/L - - -  Alk Phosphatase 38 - 126 U/L - - -  Total Bilirubin 0.3 - 1.2 mg/dL - - -     STUDIES: [place] EKG SITE RITE  Result Date: 04/24/2021 If Site Rite image not attached, placement could not be confirmed due to current cardiac rhythm.     Impression / Plan:   [name] is a 51 y.o. female with metabolic syndrome, end-stage renal disease on hemodialysis who is admitted with acute pancreatitis of unclear etiology.  Acute pancreatitis, uncomplicated: Unclear etiology No history of alcohol use, no evidence of cholelithiasis or choledocholithiasis, serum triglycerides mildly elevated Encourage oral hydration, small volume IV fluids at a slow rate as tolerated Advance diet as tolerated Check IgG4 levels Agree with CT abdomen and pelvis with contrast, pancreas protocol to evaluate for any underlying pancreatic lesion/neoplasm    Thank you for involving me in the care of this patient.      LOS: 3 days   Sherri Sear, MD  04/24/2021, 3:17 PM    Note: This dictation was prepared with Dragon dictation along with smaller phrase technology. Any transcriptional errors that result from this process are unintentional.

## 2021-04-24 NOTE — Progress Notes (Signed)
Received order for PICC, Nephrologist [facility] [place] no to PICC. Patient have new ultrasound PIV at this time.MD will d/c order for PICC.

## 2021-04-25 ENCOUNTER — Telehealth: Payer: Self-pay | Admitting: Pulmonary Disease

## 2021-04-25 DIAGNOSIS — G4733 Obstructive sleep apnea (adult) (pediatric): Secondary | ICD-10-CM

## 2021-04-25 DIAGNOSIS — Z992 Dependence on renal dialysis: Secondary | ICD-10-CM | POA: Diagnosis not present

## 2021-04-25 DIAGNOSIS — J449 Chronic obstructive pulmonary disease, unspecified: Secondary | ICD-10-CM | POA: Diagnosis not present

## 2021-04-25 DIAGNOSIS — M86071 Acute hematogenous osteomyelitis, right ankle and foot: Secondary | ICD-10-CM | POA: Diagnosis not present

## 2021-04-25 DIAGNOSIS — J9611 Chronic respiratory failure with hypoxia: Secondary | ICD-10-CM | POA: Diagnosis not present

## 2021-04-25 DIAGNOSIS — N186 End stage renal disease: Secondary | ICD-10-CM | POA: Diagnosis not present

## 2021-04-25 DIAGNOSIS — K859 Acute pancreatitis without necrosis or infection, unspecified: Secondary | ICD-10-CM | POA: Diagnosis not present

## 2021-04-25 DIAGNOSIS — L97509 Non-pressure chronic ulcer of other part of unspecified foot with unspecified severity: Secondary | ICD-10-CM | POA: Diagnosis not present

## 2021-04-25 DIAGNOSIS — L989 Disorder of the skin and subcutaneous tissue, unspecified: Secondary | ICD-10-CM | POA: Diagnosis not present

## 2021-04-25 LAB — CBC WITH DIFFERENTIAL/PLATELET
Abs Immature Granulocytes: 0.03 10*3/uL (ref 0.00–0.07)
Basophils Absolute: 0 10*3/uL (ref 0.0–0.1)
Basophils Relative: 1 %
Eosinophils Absolute: 0.2 10*3/uL (ref 0.0–0.5)
Eosinophils Relative: 3 %
HCT: 37.9 % (ref 36.0–46.0)
Hemoglobin: 11.8 g/dL — ABNORMAL LOW (ref 12.0–15.0)
Immature Granulocytes: 1 %
Lymphocytes Relative: 15 %
Lymphs Abs: 0.9 10*3/uL (ref 0.7–4.0)
MCH: 28.4 pg (ref 26.0–34.0)
MCHC: 31.1 g/dL (ref 30.0–36.0)
MCV: 91.3 fL (ref 80.0–100.0)
Monocytes Absolute: 0.6 10*3/uL (ref 0.1–1.0)
Monocytes Relative: 10 %
Neutro Abs: 4.5 10*3/uL (ref 1.7–7.7)
Neutrophils Relative %: 70 %
Platelets: 267 10*3/uL (ref 150–400)
RBC: 4.15 MIL/uL (ref 3.87–5.11)
RDW: 14.9 % (ref 11.5–15.5)
WBC: 6.2 10*3/uL (ref 4.0–10.5)
nRBC: 0 % (ref 0.0–0.2)

## 2021-04-25 LAB — LIPASE, BLOOD: Lipase: 118 U/L — ABNORMAL HIGH (ref 11–51)

## 2021-04-25 LAB — RENAL FUNCTION PANEL
Albumin: 3.6 g/dL (ref 3.5–5.0)
Anion gap: 16 — ABNORMAL HIGH (ref 5–15)
BUN: 39 mg/dL — ABNORMAL HIGH (ref 6–20)
CO2: 25 mmol/L (ref 22–32)
Calcium: 8.8 mg/dL — ABNORMAL LOW (ref 8.9–10.3)
Chloride: 89 mmol/L — ABNORMAL LOW (ref 98–111)
Creatinine, Ser: 8.34 mg/dL — ABNORMAL HIGH (ref 0.44–1.00)
GFR, Estimated: 5 mL/min — ABNORMAL LOW (ref >60)
Glucose, Bld: 130 mg/dL — ABNORMAL HIGH (ref 70–99)
Phosphorus: 8.3 mg/dL — ABNORMAL HIGH (ref 2.5–4.6)
Potassium: 4.9 mmol/L (ref 3.5–5.1)
Sodium: 130 mmol/L — ABNORMAL LOW (ref 135–145)

## 2021-04-25 LAB — GLUCOSE, CAPILLARY
Glucose-Capillary: 138 mg/dL — ABNORMAL HIGH (ref 70–99)
Glucose-Capillary: 113 mg/dL — ABNORMAL HIGH (ref 70–99)
Glucose-Capillary: 222 mg/dL — ABNORMAL HIGH (ref 70–99)
Glucose-Capillary: 178 mg/dL — ABNORMAL HIGH (ref 70–99)
Glucose-Capillary: 146 mg/dL — ABNORMAL HIGH (ref 70–99)

## 2021-04-25 MED ORDER — LINACLOTIDE 145 MCG PO CAPS
290.0000 ug | ORAL_CAPSULE | Freq: Every morning | ORAL | Status: DC
  Administered 2021-04-25 – 2021-05-01 (×6): 290 ug via ORAL
  Filled 2021-04-25: qty 2
  Filled 2021-04-25: qty 1
  Filled 2021-04-25 (×6): qty 2

## 2021-04-25 NOTE — Telephone Encounter (Signed)
If her MD puts a consult to [place] we can see her in the hospital. ?

## 2021-04-25 NOTE — Progress Notes (Signed)
?   04/25/21 2100  ?Clinical Encounter Type  ?Visited With Patient  ?Visit Type Initial  ?Referral From Nurse  ?Consult/Referral To Chaplain  ? ?Chaplain responded to nurse consult. Chaplain provided emotional and spiritual support. Chaplain supported patient while providing compassionate presence and reflective listening. Patient uses prayer as a source of strength and to add meaning to her life. Patient appreciated [place] visit. Chaplain offered follow up care as needed and/or requested. ?

## 2021-04-25 NOTE — TOC Progression Note (Signed)
Transition of Care (TOC) - Progression Note  ? ? ?Patient Details  ?Name: [name] ?MRN: [id number] ?Date of Birth: [birth year] ? ?Transition of Care (TOC) CM/SW Contact  ?Pete Pelt, RN ?Phone Number: ?04/25/2021, 2:29 PM ? ?Clinical Narrative:   Patient continues to refuse amputation.  Hospitalist states patient will possibly be here for an extended period of time due to continued medical workup.  TOC to follow.  [facility] home [facility] ? ? ? ?Expected Discharge Plan: Home/Self Care ?Barriers to Discharge: Continued Medical Work up ? ?Expected Discharge Plan and Services ?Expected Discharge Plan: Home/Self Care ?  ?Discharge Planning Services: CM Consult ?  ?Living arrangements for the past 2 months: [place] ?                ?  ?  ?  ?  ?  ?HH Arranged: RN ?[place] Agency: [employer] ?Date HH Agency Contacted: 04/23/21 ?Time [place]: [id number] ?Representative spoke with at [place]: Malachy Mood ? ? ?Social Determinants of Health (SDOH) Interventions ?  ? ?Readmission Risk Interventions ?No flowsheet data found. ? ?

## 2021-04-25 NOTE — Telephone Encounter (Signed)
Dr. Gonzalez, please advise. thanks 

## 2021-04-25 NOTE — Consult Note (Signed)
WOC Nurse Consult Note: ?Patient receiving care in [facility]. Primary RN, Estill Bamberg, and student nurse present at time of my visit. ?Reason for Consult: Patient requesting an in person [place] visit. ?Wound type: dry gangrene to extensive area of right forefoot from ischemia. Foot determined "unsalvageable" by treating Podiatrist Dr. Boneta Lucks in 2022.  Please see his note from 12/29/20. ?Today the vast majority of the right forefoot is dry, blackened, without drainage.  The vast majority of the previously open areas are now quite small, narrow and superficial in appearance.  As I understand it, the patient had been upset because an in-person [place] visit was not performed until today. I explained that on Monday, when I became aware of the consult, I reviewed her record, images, details of visits, and then continued the Podiatrist plan of care outlined last year.  The orders I placed have been in the chart since Monday. She indicated the nurses had not been following those orders.  Estill Bamberg, myself, and the patient discussed this, and then the patient indicated the care had been done.  She simply did not understand why a [place] had not viewed her foot.  I explained that remote consults, such as the one I did for her on Monday, is one method we use to manage "stable" wounds such as hers.  She expressed her understanding and agreed there had be miscommunication on the matter. ? ?At the conclusion of my visit the care for the foot had been completed. Estill Bamberg will transfer the care order to day shift. Unit staff had moved my original order to 10 pm.  I understand it will now be transferred back to 10 a.m.  The patient seemed calm and appreciative. ? ?Thank you for the consult.  Discussed plan of care with the patient and bedside nurse.  [place] nurse will not follow at this time.  Please re-consult the [place] team if needed. ? ?Val Riles, RN, MSN, CWOCN, CNS-BC, pager [phone]  ?

## 2021-04-25 NOTE — Consult Note (Signed)
Reason for Consult: biopsy for osteomyelitis Referring Physician: Dr Camila Li [name] is an 51 y.o. female.  HPI: She is known to our group for chronic ongoing RLE ulceration and osteomyelitis. At last evaluation there was significant gangrenous and necrotic changes. Dr Posey Pronto and Dr Amalia Hailey have recommended amputation as any debridement or partial foot amputation  is not likely to survive. She would not like amputation and has been having local wound care at home. New MRI last night showed osteomyelitis of the 4th  metatarsal. She is not interested in surgical treatment of the infection but is amenable to antibiotic treatment and so we are consulted for biopsy and culture.  Past Medical History:  Diagnosis Date   (HFpEF) heart failure with preserved ejection fraction ([place])    a. 11/2014 Echo: EF 50-55%, Gr1 DD; b. 06/2016 Echo: EF 60-65%, no rwma, mod dil LA, nl RV, triv eff.   Anxiety    Asthma    Bronchitis    CHF (congestive heart failure) (HCC)    Chronic cough    Chronic respiratory failure with hypoxia and hypercapnia ([place])    a. Home O2.  (2-4L)   CKD (chronic kidney disease), stage III (HCC)    Depression    Diabetes mellitus without complication (HCC)    Diverticulosis    Dyspnea    Dysrhythmia    per pt, no arrythmias   Environmental and seasonal allergies    Extreme obesity with alveolar hypoventilation (HCC)    Hypertension    Iron deficiency anemia    a. chronic blood loss->heavy menses.   Kidney failure    dialysis tues/thur/sat   ... left arm shunt   Orthopnea    Oxygen dependent    3l/min   Pericardial effusion    a. 11/2014 CT Chest: Mod effusion; b. 11/2014 Echo: small to mod circumferential effusion. No hemodynamic compromise.   Pneumonia    history   Poorly controlled diabetes mellitus ([place])    a. 11/2014 A1c 13.2.   Swelling of both lower extremities     Past Surgical History:  Procedure Laterality Date   A/V FISTULAGRAM Left 06/09/2017    Procedure: A/V FISTULAGRAM;  Surgeon: Algernon Huxley, MD;  Location: [place] [facility];  Service: Cardiovascular;  Laterality: Left;   A/V FISTULAGRAM Left 06/16/2017   Procedure: A/V FISTULAGRAM;  Surgeon: Algernon Huxley, MD;  Location: [place] [facility];  Service: Cardiovascular;  Laterality: Left;   A/V FISTULAGRAM Left 01/28/2018   Procedure: A/V FISTULAGRAM;  Surgeon: Algernon Huxley, MD;  Location: [place] [facility];  Service: Cardiovascular;  Laterality: Left;   AMPUTATION TOE Right 10/21/2020   Procedure: AMPUTATION TOE-5th Digit Amputation Right Foot;  Surgeon: Edrick Kins, DPM;  Location: [facility];  Service: Podiatry;  Laterality: Right;   AV FISTULA PLACEMENT Left 04/25/2017   Procedure: ARTERIOVENOUS (AV) FISTULA CREATION;  Surgeon: Algernon Huxley, MD;  Location: [facility];  Service: Vascular;  Laterality: Left;   CATARACT EXTRACTION W/PHACO Left 01/21/2018   Procedure: CATARACT EXTRACTION PHACO AND INTRAOCULAR LENS PLACEMENT (IOC);  Surgeon: Leandrew Koyanagi, MD;  Location: [facility];  Service: Ophthalmology;  Laterality: Left;  [place] 01:55.5 CDE 9.05 EAUP 13.9 Fluid Pack Lot # [id number]   CESAREAN SECTION     times 3   DIALYSIS/PERMA CATHETER INSERTION Right 04/04/2017   Procedure: DIALYSIS/PERMA CATHETER INSERTION;  Surgeon: Katha Cabal, MD;  Location: [place] [facility];  Service: Cardiovascular;  Laterality: Right;  DIALYSIS/PERMA CATHETER REMOVAL N/A 09/17/2017   Procedure: DIALYSIS/PERMA CATHETER REMOVAL;  Surgeon: Algernon Huxley, MD;  Location: [place] [facility];  Service: Cardiovascular;  Laterality: N/A;   INCISION AND DRAINAGE Right 10/26/2020   Procedure: INCISION AND DRAINAGE;  Surgeon: [name], DPM;  Location: [facility];  Service: Podiatry;  Laterality: Right;   INSERTION OF DIALYSIS CATHETER N/A 04/16/2017   Procedure: INSERTION OF DIALYSIS PERM CATHETER;  Surgeon: Algernon Huxley, MD;  Location: [facility];  Service: Vascular;  Laterality: N/A;   LOWER EXTREMITY  ANGIOGRAPHY Right 10/18/2020   Procedure: Lower Extremity Angiography;  Surgeon: Algernon Huxley, MD;  Location: [place] [facility];  Service: Cardiovascular;  Laterality: Right;    Family History  Problem Relation Age of Onset   Diabetes Mother    Hypertension Mother    Hypertension Father    Diabetes Father    Hypertension Other    Diabetes Other    Breast cancer Maternal Aunt 62   Breast cancer Maternal Aunt 46    Social History:  reports that she has never smoked. She has never used smokeless tobacco. She reports that she does not currently use alcohol. She reports current drug use. Drug: Marijuana.  Allergies:  Allergies  Allergen Reactions   Dust Mite Extract Shortness Of Breath and Itching   Mold Extract [Trichophyton] Shortness Of Breath and Itching   Pollen Extract Shortness Of Breath    Itching, shortness of breath, asthma like symptoms   Sulfa Antibiotics Itching   Feraheme [Ferumoxytol] Itching    Uses benadryl so she can get the medicine    Medications: I have reviewed the patient's current medications.  Results for orders placed or performed during the hospital encounter of 04/21/21 (from the past 48 hour(s))  Glucose, capillary     Status: Abnormal   Collection Time: 04/23/21  9:01 PM  Result Value Ref Range   Glucose-Capillary 102 (H) 70 - 99 mg/dL    Comment: Glucose reference range applies only to samples taken after fasting for at least 8 hours.  Glucose, capillary     Status: Abnormal   Collection Time: 04/24/21 12:39 AM  Result Value Ref Range   Glucose-Capillary 107 (H) 70 - 99 mg/dL    Comment: Glucose reference range applies only to samples taken after fasting for at least 8 hours.  Renal function panel     Status: Abnormal   Collection Time: 04/24/21  4:29 AM  Result Value Ref Range   Sodium 128 (L) 135 - 145 mmol/L   Potassium 5.6 (H) 3.5 - 5.1 mmol/L   Chloride 91 (L) 98 - 111 mmol/L   CO2 23 22 - 32 mmol/L   Glucose, Bld 117 (H) 70 - 99  mg/dL    Comment: Glucose reference range applies only to samples taken after fasting for at least 8 hours.   BUN 62 (H) 6 - 20 mg/dL   Creatinine, Ser 11.56 (H) 0.44 - 1.00 mg/dL   Calcium 8.5 (L) 8.9 - 10.3 mg/dL   Phosphorus 10.4 (H) 2.5 - 4.6 mg/dL   Albumin 3.5 3.5 - 5.0 g/dL   GFR, Estimated 4 (L) >60 mL/min    Comment: (NOTE) Calculated using the CKD-EPI Creatinine Equation (2021)    Anion gap 14 5 - 15    Comment: Performed at [facility], [address] [place], [place] [address]  Glucose, capillary     Status: Abnormal   Collection Time: 04/24/21  8:22 AM  Result Value Ref Range   Glucose-Capillary 133 (H) 70 - 99 mg/dL    Comment: Glucose reference range applies only to samples taken after fasting for at least 8 hours.  .Hepatitis B Surface Antigen     Status: None   Collection Time: 04/24/21 12:47 PM  Result Value Ref Range   Hepatitis B Surface Ag NON REACTIVE NON REACTIVE    Comment: Performed at [facility] [address], [place], [place] [address]  Hepatitis B core antibody, IgM     Status: None   Collection Time: 04/24/21 12:47 PM  Result Value Ref Range   Hep B C IgM NON REACTIVE NON REACTIVE    Comment: Performed at [facility] [address], [place], [place] [address]  Glucose, capillary     Status: Abnormal   Collection Time: 04/24/21  2:14 PM  Result Value Ref Range   Glucose-Capillary 105 (H) 70 - 99 mg/dL    Comment: Glucose reference range applies only to samples taken after fasting for at least 8 hours.  Glucose, capillary     Status: Abnormal   Collection Time: 04/24/21  5:27 PM  Result Value Ref Range   Glucose-Capillary 198 (H) 70 - 99 mg/dL    Comment: Glucose reference range applies only to samples taken after fasting for at least 8 hours.  Glucose, capillary     Status: Abnormal   Collection Time: 04/24/21  9:27 PM  Result Value Ref Range   Glucose-Capillary 156 (H) 70 - 99 mg/dL    Comment: Glucose reference  range applies only to samples taken after fasting for at least 8 hours.  Glucose, capillary     Status: Abnormal   Collection Time: 04/25/21  5:16 AM  Result Value Ref Range   Glucose-Capillary 138 (H) 70 - 99 mg/dL    Comment: Glucose reference range applies only to samples taken after fasting for at least 8 hours.  Renal function panel     Status: Abnormal   Collection Time: 04/25/21  6:25 AM  Result Value Ref Range   Sodium 130 (L) 135 - 145 mmol/L   Potassium 4.9 3.5 - 5.1 mmol/L   Chloride 89 (L) 98 - 111 mmol/L   CO2 25 22 - 32 mmol/L   Glucose, Bld 130 (H) 70 - 99 mg/dL    Comment: Glucose reference range applies only to samples taken after fasting for at least 8 hours.   BUN 39 (H) 6 - 20 mg/dL   Creatinine, Ser 8.34 (H) 0.44 - 1.00 mg/dL   Calcium 8.8 (L) 8.9 - 10.3 mg/dL   Phosphorus 8.3 (H) 2.5 - 4.6 mg/dL   Albumin 3.6 3.5 - 5.0 g/dL   GFR, Estimated 5 (L) >60 mL/min    Comment: (NOTE) Calculated using the CKD-EPI Creatinine Equation (2021)    Anion gap 16 (H) 5 - 15    Comment: Performed at [facility], [address] [place],  [address]  CBC with Differential/Platelet     Status: Abnormal   Collection Time: 04/25/21  6:25 AM  Result Value Ref Range   WBC 6.2 4.0 - 10.5 K/uL   RBC 4.15 3.87 - 5.11 MIL/uL   Hemoglobin 11.8 (L) 12.0 - 15.0 g/dL   HCT 37.9 36.0 - 46.0 %   MCV 91.3 80.0 - 100.0 fL   MCH 28.4 26.0 - 34.0 pg   MCHC 31.1 30.0 - 36.0 g/dL   RDW 14.9 11.5 - 15.5 %  Platelets 267 150 - 400 K/uL   nRBC 0.0 0.0 - 0.2 %   Neutrophils Relative % 70 %   Neutro Abs 4.5 1.7 - 7.7 K/uL   Lymphocytes Relative 15 %   Lymphs Abs 0.9 0.7 - 4.0 K/uL   Monocytes Relative 10 %   Monocytes Absolute 0.6 0.1 - 1.0 K/uL   Eosinophils Relative 3 %   Eosinophils Absolute 0.2 0.0 - 0.5 K/uL   Basophils Relative 1 %   Basophils Absolute 0.0 0.0 - 0.1 K/uL   Immature Granulocytes 1 %   Abs Immature Granulocytes 0.03 0.00 - 0.07 K/uL    Comment:  Performed at [facility], [address] [place], [place] [address]  Lipase, blood     Status: Abnormal   Collection Time: 04/25/21  6:25 AM  Result Value Ref Range   Lipase 118 (H) 11 - 51 U/L    Comment: Performed at [facility], [address] [place], [place] [address]  Glucose, capillary     Status: Abnormal   Collection Time: 04/25/21  7:37 AM  Result Value Ref Range   Glucose-Capillary 113 (H) 70 - 99 mg/dL    Comment: Glucose reference range applies only to samples taken after fasting for at least 8 hours.  Glucose, capillary     Status: Abnormal   Collection Time: 04/25/21 11:33 AM  Result Value Ref Range   Glucose-Capillary 222 (H) 70 - 99 mg/dL    Comment: Glucose reference range applies only to samples taken after fasting for at least 8 hours.  Glucose, capillary     Status: Abnormal   Collection Time: 04/25/21  3:27 PM  Result Value Ref Range   Glucose-Capillary 178 (H) 70 - 99 mg/dL    Comment: Glucose reference range applies only to samples taken after fasting for at least 8 hours.    MR FOOT RIGHT WO CONTRAST  Result Date: 04/25/2021 CLINICAL DATA:  Possible osteomyelitis of the right fourth metatarsal. Prior fifth metatarsal amputation. EXAM: MRI OF THE RIGHT FOREFOOT WITHOUT CONTRAST TECHNIQUE: Multiplanar, multisequence MR imaging of the right forefoot was performed. No intravenous contrast was administered. COMPARISON:  None. FINDINGS: Bones/Joint/Cartilage Prior amputation of the distal half of the fifth metatarsal and phalanges. Soft tissue wound along the lateral forefoot overlying the fourth MTP joint. Bone destruction and marrow edema within the fourth metatarsal head and base of the fourth proximal phalanx concerning for osteomyelitis. Bone marrow edema in the third metatarsal head and base of the third proximal phalanx which may be reactive versus osteomyelitis. Severe bone marrow edema in the first proximal phalanx concerning for  osteomyelitis. Severe bone marrow edema in the second proximal phalanx concerning for osteomyelitis. Moderate osteoarthritis of the first MTP joint. Bone marrow edema in the medial and lateral hallux sesamoids likely secondary to arthritis. Old healed second metatarsal neck fracture. Old healed third metatarsal neck fracture. Hallux valgus.  No joint effusion. Ligaments Collateral ligaments are intact.  Lisfranc ligament is intact. Muscles and Tendons Flexor, peroneal and extensor compartment tendons are intact. Diffuse T2 hyperintense signal throughout the plantar musculature likely neurogenic with atrophy. Soft tissue Large soft tissue wound along the plantar aspect of the medial aspect of the midfoot. No soft tissue mass. IMPRESSION: 1. Soft tissue wound along the lateral forefoot overlying the fourth MTP joint. Osteomyelitis of the fourth metatarsal head and base of the fourth proximal phalanx. 2. Bone marrow edema in the third metatarsal head and base of the third proximal phalanx  which may be reactive versus osteomyelitis. 3. Severe bone marrow edema in the first proximal phalanx and second proximal phalanx concerning for osteomyelitis. 4. Moderate osteoarthritis of the first MTP joint. Bone marrow edema in the medial and lateral hallux sesamoids likely secondary to arthritis. Electronically Signed   By: Kathreen Devoid M.D.   On: 04/25/2021 08:29   CT PANCREAS ABD W/WO  Result Date: 04/24/2021 CLINICAL DATA:  Follow-up acute pancreatitis. EXAM: CT ABDOMEN WITHOUT AND WITH CONTRAST TECHNIQUE: Multidetector CT imaging of the abdomen was performed following the standard protocol before and following the bolus administration of intravenous contrast. RADIATION DOSE REDUCTION: This exam was performed according to the departmental dose-optimization program which includes automated exposure control, adjustment of the mA and/or kV according to patient size and/or use of iterative reconstruction technique. CONTRAST:   11mL OMNIPAQUE IOHEXOL 300 MG/ML  SOLN COMPARISON:  CT scan 04/21/2021 FINDINGS: Lower chest: Stable mild cardiac enlargement. Significant age advanced aortic and coronary artery calcifications. Persistent area of probable rounded atelectasis at the right lung base and slightly progressive atelectasis at the left lung base. Hepatobiliary: No hepatic lesions or intrahepatic biliary dilatation. Gallbladder is mildly contracted and demonstrates uniform wall thickening. No common bile duct dilatation. Pancreas: Persistent changes of pancreatitis involving the pancreatic head which is edematous and slightly enlarged but normal enhancement without findings for pancreatic necrosis. Spleen: Normal size.  No focal lesions. Adrenals/Urinary Tract: Stable left adrenal gland nodules. No worrisome renal lesions or hydronephrosis. Extensive renal vascular calcifications again demonstrated. Stomach/Bowel: The stomach, duodenum, small bowel and colon are grossly normal. Vascular/Lymphatic: Significant age advanced vascular disease. No mesenteric or retroperitoneal mass or adenopathy. Other: No ascites or abdominal wall hernia. Musculoskeletal: No significant bony findings. IMPRESSION: 1. Persistent changes of pancreatitis involving the pancreatic head which is edematous and slightly enlarged but normal enhancement without findings for pancreatic necrosis. 2. Stable left adrenal gland nodules. 3. Stable area of probable rounded atelectasis at the right lung base and slightly progressive atelectasis at the left lung base. 4. Significant age advanced vascular disease. Aortic Atherosclerosis (ICD10-I70.0). Electronically Signed   By: Marijo Sanes M.D.   On: 04/24/2021 17:43   [place] EKG SITE RITE  Result Date: 04/24/2021 If [facility] image not attached, placement could not be confirmed due to current cardiac rhythm.   ROS Blood pressure 126/60, pulse 82, temperature 98.3 F (36.8 C), resp. rate 19, height 5\' 4"  (1.626 m), weight  119.2 kg, last menstrual period 09/06/2016, SpO2 97 %.  Vitals:   04/25/21 1950 04/25/21 2014  BP:  126/60  Pulse:  82  Resp:  19  Temp:  98.3 F (36.8 C)  SpO2: 94% 97%    General AA&O x3. Normal mood and affect.  Vascular Dorsalis pedis and posterior tibial pulses  not palpable  Neurologic Epicritic sensation grossly present.  Dermatologic (Wound) Photos reviewed gross necrosis of  half of forefoot, large open ulceration lateral foot   Orthopedic: Motor intact BLE.    Assessment/Plan:  Right foot osteomyelitis -Imaging: Studies independently reviewed -I discussed the prospects of limbs salvage which she already knows is poor but wishes to not proceed with amputation. She says she has prayed on this and feels the foot is improving - I discussed the rationale for biopsy and tissue to guide antibiotic therapy as well as the risks benefits and potential complications. She understands and wishes to proceed. She will be scheduled for this Friday with Dr Posey Pronto (HD is scheduled tomorrow)  [name] 04/25/2021,  8:37 PM   Best available via secure chat for questions or concerns.

## 2021-04-25 NOTE — Telephone Encounter (Signed)
Spoke to patient and relayed below message. ?She will speak with MD about consult. ?Nothing further needed at this time.  ? ?

## 2021-04-25 NOTE — Progress Notes (Signed)
[name] ? ?MRN: [id number] ? ?DOB/AGE: [birth year] 50 y.o. ? ?Primary Care Physician:[name], NP ? ?Admit date: 04/21/2021 ? ?Chief Complaint:  ?Chief Complaint  ?Patient presents with  ? Abdominal Pain  ? Nausea  ? ? ?S-Pt presented on  04/21/2021 with  ?Chief Complaint  ?Patient presents with  ? Abdominal Pain  ? Nausea  ?[name] ?Patient is a 51 year old African-American female with a past medical history of end-stage renal disease, patient is on Tuesday Thursday Saturday schedule, diabetes mellitus, history of pancreatitis who came to the ER with chief complaint of abdominal pain. ? ?Patient seen sitting up in bed ?Currently eating breakfast ?Complains of mild, nagging abdominal pain ?Denies nausea at this time ?Patient seen later in morning, complaining of nausea.  Just received antiemetic. ? ? ?WUJ:WJXBJ from the symptoms mentioned above,there are no other symptoms referable to all systems reviewed. ? ?Physical Exam: ?Vital signs in last 24 hours: ?Temp:  [97.8 ?F (36.6 ?C)-99 ?F (37.2 ?C)] 98.9 ?F (37.2 ?C) (03/01 0735) ?Pulse Rate:  [78-104] 78 (03/01 0735) ?Resp:  [15-44] 15 (03/01 0735) ?BP: (48-158)/(33-92) 133/63 (03/01 0735) ?SpO2:  [92 %-100 %] 99 % (03/01 0735) ?Weight:  [119.2 kg] 119.2 kg (02/28 1329) ?Weight change:  ?Last BM Date : 04/21/21 ? ?Intake/Output from previous day: ?02/28 0701 - 03/01 0700 ?In: 317.5 [P.O.:240; IV Piggyback:77.5] ?Out: 1974  ?Total I/O ?In: 240 [P.O.:240] ?Out: -  ? ? ?Physical Exam: ? ?General- pt is awake,alert, oriented to time place and person ? ?Resp- No acute REsp distress, CTA B/L NO Rhonchi ? ?CVS- S1S2 regular in rate and rhythm ? ?GIT- BS+, soft, minimal tenderness  , Non distended ? ?EXT- trace LE Edema,  No Cyanosis right foot dressing ? ?Access-  LUE AVF ? ?Lab Results: ? ?CBC ? ?Recent Labs  ?  04/23/21 ?0653 04/25/21 ?0625  ?WBC 6.6 6.2  ?HGB 13.5 11.8*  ?HCT 44.1 37.9  ?PLT 295 267  ? ? ? ?BMET ? ?Recent Labs  ?  04/24/21 ?0429 04/25/21 ?4782  ?NA  128* 130*  ?K 5.6* 4.9  ?CL 91* 89*  ?CO2 23 25  ?GLUCOSE 117* 130*  ?BUN 62* 39*  ?CREATININE 11.56* 8.34*  ?CALCIUM 8.5* 8.8*  ? ? ? ? ? ?Most recent Creatinine trend  ?Lab Results  ?Component Value Date  ? CREATININE 8.34 (H) 04/25/2021  ? CREATININE 11.56 (H) 04/24/2021  ? CREATININE 10.20 (H) 04/23/2021  ? ?  ? ? ?MICRO ? ? ?Recent Results (from the past 240 hour(s))  ?Resp Panel by RT-PCR (Flu A&B, Covid) Nasopharyngeal Swab     Status: None  ? Collection Time: 04/21/21 12:16 PM  ? Specimen: Nasopharyngeal Swab; Nasopharyngeal(NP) swabs in vial transport medium  ?Result Value Ref Range Status  ? SARS Coronavirus 2 by RT PCR NEGATIVE NEGATIVE Final  ?  Comment: (NOTE) ?SARS-CoV-2 target nucleic acids are NOT DETECTED. ? ?The SARS-CoV-2 RNA is generally detectable in upper respiratory ?specimens during the acute phase of infection. The lowest ?concentration of SARS-CoV-2 viral copies this assay can detect is ?138 copies/mL. A negative result does not preclude SARS-Cov-2 ?infection and should not be used as the sole basis for treatment or ?other patient management decisions. A negative result may occur with  ?improper specimen collection/handling, submission of specimen other ?than nasopharyngeal swab, presence of viral mutation(s) within the ?areas targeted by this assay, and inadequate number of viral ?copies(<138 copies/mL). A negative result must be combined with ?clinical observations, patient history, and epidemiological ?  information. The expected result is Negative. ? ?Fact Sheet for Patients:  ?EntrepreneurPulse.com.au ? ?Fact Sheet for Healthcare Providers:  ?IncredibleEmployment.be ? ?This test is no t yet approved or cleared by the [place] FDA and  ?has been authorized for detection and/or diagnosis of SARS-CoV-2 by ?FDA under an Emergency Use Authorization (EUA). This EUA will remain  ?in effect (meaning this test can be used) for the duration of the ?COVID-19  declaration under Section 564(b)(1) of the Act, 21 ?U.S.C.section 360bbb-3(b)(1), unless the authorization is terminated  ?or revoked sooner.  ? ? ?  ? Influenza A by PCR NEGATIVE NEGATIVE Final  ? Influenza B by PCR NEGATIVE NEGATIVE Final  ?  Comment: (NOTE) ?The Xpert Xpress SARS-CoV-2/FLU/RSV plus assay is intended as an aid ?in the diagnosis of influenza from Nasopharyngeal swab specimens and ?should not be used as a sole basis for treatment. Nasal washings and ?aspirates are unacceptable for Xpert Xpress SARS-CoV-2/FLU/RSV ?testing. ? ?Fact Sheet for Patients: ?EntrepreneurPulse.com.au ? ?Fact Sheet for Healthcare Providers: ?IncredibleEmployment.be ? ?This test is not yet approved or cleared by the [place] FDA and ?has been authorized for detection and/or diagnosis of SARS-CoV-2 by ?FDA under an Emergency Use Authorization (EUA). This EUA will remain ?in effect (meaning this test can be used) for the duration of the ?COVID-19 declaration under Section 564(b)(1) of the Act, 21 U.S.C. ?section 360bbb-3(b)(1), unless the authorization is terminated or ?revoked. ? ?Performed at [facility], [place], ?[place] [address] ?  ?MRSA Next Gen by PCR, Nasal     Status: None  ? Collection Time: 04/23/21  6:55 AM  ? Specimen: Nasal Mucosa; Nasal Swab  ?Result Value Ref Range Status  ? MRSA by PCR Next Gen NOT DETECTED NOT DETECTED Final  ?  Comment: (NOTE) ?The GeneXpert MRSA Assay (FDA approved for NASAL specimens only), ?is one component of a comprehensive MRSA colonization surveillance ?program. It is not intended to diagnose MRSA infection nor to guide ?or monitor treatment for MRSA infections. ?Test performance is not FDA approved in patients less than 2 years ?old. ?Performed at [facility], [place], ?[place] [address] ?  ?  ? ?CT abdomen done on April 21, 2021. ?IMPRESSION: ?Enlarged pancreatic head with adjacent  peripancreatic soft tissue ?stranding. This is suspicious for focal pancreatitis, although ?pancreatic carcinoma cannot definitely be excluded. Recommend ?correlation with serum lipase level, and continued follow-up by ?pancreatic protocol MRI or CT without and with contrast. ?  ?Right-sided pleural thickening and rounded opacity in posterior ?right lower lobe, new since 2016 exam. Rounded atelectasis is ?favored, however pulmonary infiltrate or neoplasm cannot definitely ?be excluded. Recommend clinical correlation, and short-term ?follow-up with chest CT or PET-CT. ? ? ?Impression: ? ? ? ?Patient is well-known to our practice as patient undergoes dialysis at [facility] dialysis under care of Dr. Murlean Iba ?Patient is on Tuesday Thursday Saturday schedule ?Patient has a target dry weight of 119.5 kg ? ?1)  End-stage renal disease on dialysis. ? Received dialysis yesterday, UF goal 1.9 L achieved.  Due to low blood pressure, UF goal reduced.  We will continue to monitor fluid status and allow leniency due to acute medical concerns. Next treatment scheduled for Thursday. ? ?2)HTN with chronic kidney disease ? Currently only receiving midodrine 20 mg as needed for low blood pressure.  ? ? ?3)Anemia of chronic disease ? ?CBC Latest Ref Rng & Units 04/25/2021 04/23/2021 04/22/2021  ?WBC 4.0 - 10.5 K/uL 6.2 6.6  8.9  ?Hemoglobin 12.0 - 15.0 g/dL 11.8(L) 13.5 12.2  ?Hematocrit 36.0 - 46.0 % 37.9 44.1 40.4  ?Platelets 150 - 400 K/uL 267 295 301  ?  ?Patient did receive Mircera as outpatient ?Hemoglobin remains at goal ? ?4) Secondary hyperparathyroidism -CKD Mineral-Bone Disorder/Renal failure associated hyperphosphatemia  ? ?Lab Results  ?Component Value Date  ? PTH 134 (H) 04/21/2017  ? CALCIUM 8.8 (L) 04/25/2021  ? CAION 0.81 (LL) 10/26/2020  ? PHOS 8.3 (H) 04/25/2021  ? ?Binders held in setting of pancreatitis.   ? ?5)Pancreatitis ? Diet advanced to soft.  Patient tolerating well.  Continue supportive  care ? ?[place] ?04/25/2021, 11:30 AM  ?

## 2021-04-25 NOTE — Telephone Encounter (Signed)
Lm for patient.  

## 2021-04-25 NOTE — Progress Notes (Signed)
PROGRESS NOTE  [name]    DOB: [birth year], 51 y.o.  ZOX:[id number]    Code Status: Full Code   DOA: 04/21/2021   LOS: 4   Brief hospital course  [name] is a 51 y.o. female with a PMH significant for HTN, COPD on 3 L nasal cannula chronically, ESRD on HD TTS, type II DM, known osteomyelitis of right foot s/p fifth ray amputation with poorly healing ulcers. They presented from home to the ED on 04/21/2021 with epigastric pain, nausea, vomiting x 4 days.  At the same time, she also had increased sputum production with cough so was started on azithromycin but unable to tolerate the full treatment. In the ED, it was found that they had pancreatitis and likely COPD exacerbation.  They were treated with analgesia, bowel rest, IV fluids.  GI was consulted. Imaging suggested osteomyelitis of right foot. Additionally, she was mildly hyponatremic and hyperkalemic due to missing several dialysis sessions. Patient was admitted to medicine service for further workup and management of pancreatitis, osteomyelitis, electrolyte abnormalities as outlined in detail below.  04/25/21 -stable, improved  Assessment & Plan  Principal Problem:   Pancreatitis Active Problems:   Obesity, Class III, BMI 40-49.9 (morbid obesity) (HCC)   Type 2 diabetes mellitus with ESRD (end-stage renal disease) (HCC)   COPD with chronic bronchitis (HCC)   End-stage renal disease on hemodialysis (HCC)   Chronic respiratory failure with hypoxia (HCC)   Diabetic foot ulcer (HCC)   Hyponatremia   Metabolic acidosis, increased anion gap   GERD (gastroesophageal reflux disease)  Pancreatitis- improving. Patient still endorsing epigastric pain but is tolerating soft diet well. CT yesterday showed normal progression of pancreatitis without necrosis.  - GI following, appreciate recs - advance diet as tolerated - continue analgesia PRN and taper off as tolerated   Metabolic acidosis, increased anion gap  Resolved after  HD. Acute.  On admission CO2 19 and anion gap 22.  Glucose levels were within normal limits.  Suspect secondary to missed hemodialysis sessions. -Continue to monitor   Hyponatremia- (present on admission)- acute on chronic. Improved. 130 Na+   Diabetic foot ulcer   osteomyelitis- previously diagnosed with gangrene and associated osteomyelitis requiring fifth ray amputation of the foot 09/2020. - xray suggestive of osteo> MRI confirmed osteomyelitis - patient refuses further amputation. Consulted ID for recommendations in guiding Abx course. She may be able to receive IV Abx at HD for duration of therapy - podiatry consulted for biopsy to guide Abx course. Holding off on Abx until Bx taken  - plan for bx 3/2 - wound care   Chronic respiratory failure with hypoxia ([name])- chronic, stable Chronic respiratory failure on 3 L nasal cannula oxygen 24/7 with O2 saturations currently maintained.  Patient had recently had a sleep study done in the outpatient setting, but has not heard back regarding results.  CT imaging did note concern for right-sided pleural effusion. -Patient is currently at baseline O2 requirements at 3-4L by  to maintain saturation of 92% -Fluid management with dialysis -Follow-up with pulmonology in outpatient setting regarding sleep study - bronchodilators PRN - complete azithromycin course   ESRD on HD TThS   hyperkalemia (resolved)-  - nephrology managing, appreciate recs   Type 2 diabetes mellitus with ESRD (end-stage renal disease) ([name])- will impact wound healing - sSSI - semglee daily   GERD  Body mass index is 45.11 kg/m.  VTE ppx: heparin injection 5,000 Units Start: 04/22/21 0045   Diet:  Diet   DIET SOFT Room service appropriate? Yes; Fluid consistency: Thin   Subjective 04/25/21    Pt reports feeling improved. She is able to tolerate soft diet well but still has epigastric pain. R foot pain moderately well controlled. She is not agreeable to  amputation but would like antibiotic treatment.    Objective   Vitals:   04/24/21 2130 04/25/21 0046 04/25/21 0454 04/25/21 0735  BP: 122/72 120/68 (!) 158/80 133/63  Pulse: 98 89 82 78  Resp: 17 20 18 15   Temp: 98.8 F (37.1 C) 99 F (37.2 C) 98.8 F (37.1 C) 98.9 F (37.2 C)  TempSrc: Oral     SpO2: 95% 100% 98% 99%  Weight:      Height:        Intake/Output Summary (Last 24 hours) at 04/25/2021 0741 Last data filed at 04/24/2021 1900 Gross per 24 hour  Intake 317.51 ml  Output 1974 ml  Net -1656.49 ml   Filed Weights   04/24/21 0900 04/24/21 0940 04/24/21 1329  Weight: 120.6 kg 121.8 kg 119.2 kg     Physical Exam:  General: awake, alert, NAD HEENT: atraumatic, clear conjunctiva, anicteric sclera, MMM, hearing grossly normal Respiratory: normal respiratory effort. Gastrointestinal: soft, tender to epigastric area Nervous: A&O x3. no gross focal neurologic deficits, normal speech Extremities: R foot with packed ulcers on plantar and dorsal surfaces. Poor perfusion. Poor sensation.  Skin: dry, intact, normal temperature. Lesions on face Psychiatry: normal mood, congruent affect  Labs   I have personally reviewed the following labs and imaging studies CBC    Component Value Date/Time   WBC 6.2 04/25/2021 0625   RBC 4.15 04/25/2021 0625   HGB 11.8 (L) 04/25/2021 0625   HGB 12.4 01/11/2014 1324   HCT 37.9 04/25/2021 0625   HCT 39.2 01/11/2014 1324   PLT 267 04/25/2021 0625   PLT 370 01/11/2014 1324   MCV 91.3 04/25/2021 0625   MCV 86 01/11/2014 1324   MCH 28.4 04/25/2021 0625   MCHC 31.1 04/25/2021 0625   RDW 14.9 04/25/2021 0625   RDW 15.1 (H) 01/11/2014 1324   LYMPHSABS 0.9 04/25/2021 0625   LYMPHSABS 1.8 01/11/2014 1324   MONOABS 0.6 04/25/2021 0625   MONOABS 0.5 01/11/2014 1324   EOSABS 0.2 04/25/2021 0625   EOSABS 0.2 01/11/2014 1324   BASOSABS 0.0 04/25/2021 0625   BASOSABS 0.1 01/11/2014 1324   BMP Latest Ref Rng & Units 04/24/2021 04/23/2021  04/22/2021  Glucose 70 - 99 mg/dL 117(H) 113(H) 107(H)  BUN 6 - 20 mg/dL 62(H) 55(H) 47(H)  Creatinine 0.44 - 1.00 mg/dL 11.56(H) 10.20(H) 8.61(H)  Sodium 135 - 145 mmol/L 128(L) 130(L) 130(L)  Potassium 3.5 - 5.1 mmol/L 5.6(H) 5.5(H) 4.5  Chloride 98 - 111 mmol/L 91(L) 90(L) 90(L)  CO2 22 - 32 mmol/L 23 22 26   Calcium 8.9 - 10.3 mg/dL 8.5(L) 8.8(L) 8.6(L)    CT PANCREAS ABD W/WO  Result Date: 04/24/2021 CLINICAL DATA:  Follow-up acute pancreatitis. EXAM: CT ABDOMEN WITHOUT AND WITH CONTRAST TECHNIQUE: Multidetector CT imaging of the abdomen was performed following the standard protocol before and following the bolus administration of intravenous contrast. RADIATION DOSE REDUCTION: This exam was performed according to the departmental dose-optimization program which includes automated exposure control, adjustment of the mA and/or kV according to patient size and/or use of iterative reconstruction technique. CONTRAST:  190mL OMNIPAQUE IOHEXOL 300 MG/ML  SOLN COMPARISON:  CT scan 04/21/2021 FINDINGS: Lower chest: Stable mild cardiac enlargement. Significant age advanced aortic and  coronary artery calcifications. Persistent area of probable rounded atelectasis at the right lung base and slightly progressive atelectasis at the left lung base. Hepatobiliary: No hepatic lesions or intrahepatic biliary dilatation. Gallbladder is mildly contracted and demonstrates uniform wall thickening. No common bile duct dilatation. Pancreas: Persistent changes of pancreatitis involving the pancreatic head which is edematous and slightly enlarged but normal enhancement without findings for pancreatic necrosis. Spleen: Normal size.  No focal lesions. Adrenals/Urinary Tract: Stable left adrenal gland nodules. No worrisome renal lesions or hydronephrosis. Extensive renal vascular calcifications again demonstrated. Stomach/Bowel: The stomach, duodenum, small bowel and colon are grossly normal. Vascular/Lymphatic: Significant age  advanced vascular disease. No mesenteric or retroperitoneal mass or adenopathy. Other: No ascites or abdominal wall hernia. Musculoskeletal: No significant bony findings. IMPRESSION: 1. Persistent changes of pancreatitis involving the pancreatic head which is edematous and slightly enlarged but normal enhancement without findings for pancreatic necrosis. 2. Stable left adrenal gland nodules. 3. Stable area of probable rounded atelectasis at the right lung base and slightly progressive atelectasis at the left lung base. 4. Significant age advanced vascular disease. Aortic Atherosclerosis (ICD10-I70.0). Electronically Signed   By: Marijo Sanes M.D.   On: 04/24/2021 17:43   [place] EKG SITE RITE  Result Date: 04/24/2021 If [facility] image not attached, placement could not be confirmed due to current cardiac rhythm.   Disposition Plan & Communication  Patient status: Inpatient  Admitted From: Home Planned disposition location: Home Anticipated discharge date: 3/4 pending biopsy and antibiotic choice  Family Communication: none    Author: Richarda Osmond, DO [facility] 04/25/2021, 7:41 AM   Available by Epic secure chat 7AM-7PM. If 7PM-7AM, please contact night-coverage.  [facility] contact information found on [email or web address].

## 2021-04-26 DIAGNOSIS — E11621 Type 2 diabetes mellitus with foot ulcer: Secondary | ICD-10-CM | POA: Diagnosis not present

## 2021-04-26 DIAGNOSIS — E1169 Type 2 diabetes mellitus with other specified complication: Secondary | ICD-10-CM | POA: Diagnosis not present

## 2021-04-26 DIAGNOSIS — K859 Acute pancreatitis without necrosis or infection, unspecified: Secondary | ICD-10-CM | POA: Diagnosis not present

## 2021-04-26 DIAGNOSIS — L97514 Non-pressure chronic ulcer of other part of right foot with necrosis of bone: Secondary | ICD-10-CM | POA: Diagnosis not present

## 2021-04-26 DIAGNOSIS — M86071 Acute hematogenous osteomyelitis, right ankle and foot: Secondary | ICD-10-CM | POA: Diagnosis not present

## 2021-04-26 LAB — BLOOD GAS, ARTERIAL
Acid-Base Excess: 3.4 mmol/L — ABNORMAL HIGH (ref 0.0–2.0)
Bicarbonate: 29.6 mmol/L — ABNORMAL HIGH (ref 20.0–28.0)
O2 Content: 3 L/min
O2 Saturation: 95.3 %
Patient temperature: 37
pCO2 arterial: 50 mmHg — ABNORMAL HIGH (ref 32–48)
pH, Arterial: 7.38 (ref 7.35–7.45)
pO2, Arterial: 69 mmHg — ABNORMAL LOW (ref 83–108)

## 2021-04-26 LAB — RENAL FUNCTION PANEL
Albumin: 3.7 g/dL (ref 3.5–5.0)
Anion gap: 16 — ABNORMAL HIGH (ref 5–15)
BUN: 57 mg/dL — ABNORMAL HIGH (ref 6–20)
CO2: 24 mmol/L (ref 22–32)
Calcium: 9.1 mg/dL (ref 8.9–10.3)
Chloride: 90 mmol/L — ABNORMAL LOW (ref 98–111)
Creatinine, Ser: 10.04 mg/dL — ABNORMAL HIGH (ref 0.44–1.00)
GFR, Estimated: 4 mL/min — ABNORMAL LOW (ref >60)
Glucose, Bld: 135 mg/dL — ABNORMAL HIGH (ref 70–99)
Phosphorus: 8.2 mg/dL — ABNORMAL HIGH (ref 2.5–4.6)
Potassium: 5 mmol/L (ref 3.5–5.1)
Sodium: 130 mmol/L — ABNORMAL LOW (ref 135–145)

## 2021-04-26 LAB — GLUCOSE, CAPILLARY
Glucose-Capillary: 152 mg/dL — ABNORMAL HIGH (ref 70–99)
Glucose-Capillary: 123 mg/dL — ABNORMAL HIGH (ref 70–99)
Glucose-Capillary: 224 mg/dL — ABNORMAL HIGH (ref 70–99)
Glucose-Capillary: 319 mg/dL — ABNORMAL HIGH (ref 70–99)
Glucose-Capillary: 243 mg/dL — ABNORMAL HIGH (ref 70–99)

## 2021-04-26 LAB — IGG 4: IgG, Subclass 4: 9 mg/dL (ref 2–96)

## 2021-04-26 MED ORDER — OXYCODONE HCL 5 MG PO TABS
ORAL_TABLET | ORAL | Status: AC
  Filled 2021-04-26: qty 2

## 2021-04-26 NOTE — Care Management Important Message (Signed)
Important Message ? ?Patient Details  ?Name: [name] ?MRN: [id number] ?Date of Birth: 02-15-1971 ? ? ?Medicare Important Message Given:  Yes ? ? ? ? ?[name] ?04/26/2021, 10:55 AM ?

## 2021-04-26 NOTE — Progress Notes (Signed)
Pt transferred to Hemodialysis Unit via the bed by the orderly; pt in stable condition ?

## 2021-04-26 NOTE — Progress Notes (Signed)
Cephas Darby, MD [address]  [place]  [place], [place] [address]  Main: [phone]  Fax: [phone] Pager: [phone]   Primary Care Physician: [name], NP  Primary Gastroenterologist:  Dr. Cephas Darby  Chief Complaint  Patient presents with   Abdominal Pain   Nausea    HPI: [name] is a 51 y.o. female who is admitted with acute pancreatitis.  Patient reports that her pain is significantly better, still experiencing mild dull pain postprandial.  Able to tolerate p.o.  She remains admitted for management of osteomyelitis of her right foot  Current Facility-Administered Medications  Medication Dose Route Frequency Provider Last Rate Last Admin   acetaminophen (TYLENOL) tablet 650 mg  650 mg Oral Q6H PRN Fuller Plan A, MD   650 mg at 04/23/21 0830   Or   acetaminophen (TYLENOL) suppository 650 mg  650 mg Rectal Q6H PRN Fuller Plan A, MD       albuterol (PROVENTIL) (2.5 MG/3ML) 0.083% nebulizer solution 2.5 mg  2.5 mg Nebulization Q6H PRN Tamala Julian, Rondell A, MD   2.5 mg at 04/26/21 1725   arformoterol (BROVANA) nebulizer solution 15 mcg  15 mcg Nebulization Q12H Smith, Rondell A, MD   15 mcg at 04/26/21 0746   budesonide (PULMICORT) nebulizer solution 0.5 mg  0.5 mg Nebulization BID Fuller Plan A, MD   0.5 mg at 04/26/21 0746   Chlorhexidine Gluconate Cloth 2 % PADS 6 each  6 each Topical Q0600 Fuller Plan A, MD   6 each at 04/26/21 0529   citalopram (CELEXA) tablet 20 mg  20 mg Oral QHS Smith, Rondell A, MD       ferric citrate (AURYXIA) tablet 210 mg  210 mg Oral TID WC Smith, Rondell A, MD   210 mg at 04/26/21 1710   heparin injection 5,000 Units  5,000 Units Subcutaneous Q8H Smith, Rondell A, MD   5,000 Units at 04/26/21 1440   HYDROmorphone (DILAUDID) injection 1 mg  1 mg Intravenous Q3H PRN Swayze, Ava, DO   1 mg at 04/26/21 1725   insulin aspart (novoLOG) injection 0-15 Units  0-15 Units Subcutaneous TID WC Smith, Rondell A, MD    5 Units at 04/26/21 1438   insulin glargine-yfgn (SEMGLEE) injection 18 Units  18 Units Subcutaneous QHS Swayze, Ava, DO   18 Units at 04/25/21 2118   lactulose (CHRONULAC) 10 GM/15ML solution 20 g  20 g Oral Daily PRN Fuller Plan A, MD       lidocaine-prilocaine (EMLA) cream   Topical PRN Fuller Plan A, MD       linaclotide (LINZESS) capsule 290 mcg  290 mcg Oral q morning Doristine Mango L, MD   290 mcg at 04/26/21 1438   loratadine (CLARITIN) tablet 10 mg  10 mg Oral QHS Smith, Rondell A, MD   10 mg at 04/25/21 2114   midodrine (PROAMATINE) tablet 20 mg  20 mg Oral Daily PRN Fuller Plan A, MD   20 mg at 04/24/21 1055   multivitamin (RENA-VIT) tablet 1 tablet  1 tablet Oral QHS Smith, Rondell A, MD   1 tablet at 04/25/21 2115   ondansetron (ZOFRAN) injection 4 mg  4 mg Intravenous Q6H PRN Fuller Plan A, MD   4 mg at 04/26/21 0741   oxyCODONE (Oxy IR/ROXICODONE) immediate release tablet 5-10 mg  5-10 mg Oral Q4H PRN Swayze, Ava, DO   10 mg at 04/26/21 1227   pantoprazole (PROTONIX) EC tablet 40  mg  40 mg Oral QHS Benita Gutter, RPH   40 mg at 04/25/21 2114   povidone-iodine 10 % swab   Topical Daily Swayze, Ava, DO   Given at 04/25/21 4034   revefenacin (YUPELRI) nebulizer solution 175 mcg  175 mcg Nebulization Daily Fuller Plan A, MD   175 mcg at 04/26/21 0746   sodium chloride flush (NS) 0.9 % injection 3 mL  3 mL Intravenous Q12H Smith, Rondell A, MD   3 mL at 04/26/21 0746    Allergies as of 04/21/2021 - Review Complete 04/21/2021  Allergen Reaction Noted   Dust mite extract Shortness Of Breath and Itching 11/10/2016   Mold extract [trichophyton] Shortness Of Breath and Itching 11/10/2016   Pollen extract Shortness Of Breath    Sulfa antibiotics Itching 12/15/2014   Feraheme [ferumoxytol] Itching 02/17/2015    NSAIDs: None  Antiplts/Anticoagulants/Anti thrombotics: None  ROS:  General: Negative for anorexia, weight loss, fever, chills, fatigue, weakness. ENT:  Negative for hoarseness, difficulty swallowing , nasal congestion. CV: Negative for chest pain, angina, palpitations, dyspnea on exertion, peripheral edema.  Respiratory: Negative for dyspnea at rest, dyspnea on exertion, cough, sputum, wheezing.  GI: See history of present illness. GU:  Negative for dysuria, hematuria, urinary incontinence, urinary frequency, nocturnal urination.  Endo: Negative for unusual weight change.    Physical Examination:   BP 127/78 (BP Location: Right Arm)    Pulse 94    Temp 98 F (36.7 C)    Resp 18    Ht 5\' 4"  (1.626 m)    Wt 115.8 kg    LMP 09/06/2016 (Approximate)    SpO2 93%    BMI 43.82 kg/m   General: Well-nourished, well-developed in no acute distress.  Eyes: No icterus. Conjunctivae pink. Mouth: Oropharyngeal mucosa moist and pink , no lesions erythema or exudate. Lungs: Clear to auscultation bilaterally. Non-labored. Heart: Regular rate and rhythm, no murmurs rubs or gallops.  Abdomen: Bowel sounds are normal, nontender, nondistended, no hepatosplenomegaly or masses, no hernia , no rebound or guarding.   Neuro: Alert and oriented x 3.  Grossly intact. Skin: Warm and dry, no jaundice.   Psych: Alert and cooperative, normal mood and affect.   Imaging Studies: CT ABDOMEN PELVIS WO CONTRAST  Result Date: 04/21/2021 CLINICAL DATA:  Abdominal pain and nausea and vomiting. Dialysis patient. EXAM: CT ABDOMEN AND PELVIS WITHOUT CONTRAST TECHNIQUE: Multidetector CT imaging of the abdomen and pelvis was performed following the standard protocol without IV contrast. RADIATION DOSE REDUCTION: This exam was performed according to the departmental dose-optimization program which includes automated exposure control, adjustment of the mA and/or kV according to patient size and/or use of iterative reconstruction technique. COMPARISON:  10/28/2014 FINDINGS: Lower chest: Right-sided pleural thickening noted. Rounded opacity is seen in the posterior right lower lobe  abutting the pleural surface. These findings are new since prior exam in 2016. Rounded atelectasis is favored, however pulmonary infiltrate or neoplasm cannot definitely be excluded. Hepatobiliary: No mass visualized on this unenhanced exam. Gallbladder is unremarkable. No evidence of biliary ductal dilatation. Pancreas: The pancreatic head is enlarged, with adjacent peripancreatic soft tissue stranding. This is new since previous study and is suspicious for focal pancreatitis, although pancreatic carcinoma cannot definitely be excluded. No definite evidence of pancreatic ductal dilatation on this unenhanced exam. No evidence of peripancreatic fluid collections. Spleen:  Within normal limits in size. Adrenals/Urinary tract: No evidence of urolithiasis or hydronephrosis. Unremarkable unopacified urinary bladder. Stomach/Bowel: No evidence of obstruction, inflammatory process, or  abnormal fluid collections. Normal appendix visualized. Vascular/Lymphatic: No pathologically enlarged lymph nodes identified. No evidence of abdominal aortic aneurysm. Aortic atherosclerotic calcification noted. Reproductive:  No mass or other significant abnormality. Other:  None. Musculoskeletal:  No suspicious bone lesions identified. IMPRESSION: Enlarged pancreatic head with adjacent peripancreatic soft tissue stranding. This is suspicious for focal pancreatitis, although pancreatic carcinoma cannot definitely be excluded. Recommend correlation with serum lipase level, and continued follow-up by pancreatic protocol MRI or CT without and with contrast. Right-sided pleural thickening and rounded opacity in posterior right lower lobe, new since 2016 exam. Rounded atelectasis is favored, however pulmonary infiltrate or neoplasm cannot definitely be excluded. Recommend clinical correlation, and short-term follow-up with chest CT or PET-CT. Electronically Signed   By: Marlaine Hind M.D.   On: 04/21/2021 12:50   MR FOOT RIGHT WO  CONTRAST  Result Date: 04/25/2021 CLINICAL DATA:  Possible osteomyelitis of the right fourth metatarsal. Prior fifth metatarsal amputation. EXAM: MRI OF THE RIGHT FOREFOOT WITHOUT CONTRAST TECHNIQUE: Multiplanar, multisequence MR imaging of the right forefoot was performed. No intravenous contrast was administered. COMPARISON:  None. FINDINGS: Bones/Joint/Cartilage Prior amputation of the distal half of the fifth metatarsal and phalanges. Soft tissue wound along the lateral forefoot overlying the fourth MTP joint. Bone destruction and marrow edema within the fourth metatarsal head and base of the fourth proximal phalanx concerning for osteomyelitis. Bone marrow edema in the third metatarsal head and base of the third proximal phalanx which may be reactive versus osteomyelitis. Severe bone marrow edema in the first proximal phalanx concerning for osteomyelitis. Severe bone marrow edema in the second proximal phalanx concerning for osteomyelitis. Moderate osteoarthritis of the first MTP joint. Bone marrow edema in the medial and lateral hallux sesamoids likely secondary to arthritis. Old healed second metatarsal neck fracture. Old healed third metatarsal neck fracture. Hallux valgus.  No joint effusion. Ligaments Collateral ligaments are intact.  Lisfranc ligament is intact. Muscles and Tendons Flexor, peroneal and extensor compartment tendons are intact. Diffuse T2 hyperintense signal throughout the plantar musculature likely neurogenic with atrophy. Soft tissue Large soft tissue wound along the plantar aspect of the medial aspect of the midfoot. No soft tissue mass. IMPRESSION: 1. Soft tissue wound along the lateral forefoot overlying the fourth MTP joint. Osteomyelitis of the fourth metatarsal head and base of the fourth proximal phalanx. 2. Bone marrow edema in the third metatarsal head and base of the third proximal phalanx which may be reactive versus osteomyelitis. 3. Severe bone marrow edema in the first  proximal phalanx and second proximal phalanx concerning for osteomyelitis. 4. Moderate osteoarthritis of the first MTP joint. Bone marrow edema in the medial and lateral hallux sesamoids likely secondary to arthritis. Electronically Signed   By: Kathreen Devoid M.D.   On: 04/25/2021 08:29   DG Chest Port 1 View  Result Date: 04/21/2021 CLINICAL DATA:  Shortness of breath, pancreatitis, nausea, medical history significant of HTN, COPD, chronic respiratory failure with hypoxia and hypercapnia on 3 L of oxygen at baseline, ESRD on HD(TTS), DM type II, and gangrene with associated osteomyelitis status post fifth ray amputation of the right foot presents with complaints of 3-4 day of abdominal pain EXAM: PORTABLE CHEST 1 VIEW COMPARISON:  02/28/2021 FINDINGS: Cardiac silhouette is enlarged, but stable. No mediastinal or hilar masses. Linear opacities in the right mid to lower lung. Right lateral hemidiaphragm pleural tenting. This is similar to the prior study, likely atelectasis/pleural scarring. Mild interstitial thickening. No lung consolidation. No pneumothorax. Skeletal structures grossly intact.  IMPRESSION: 1. No acute cardiopulmonary disease. 2. Chronic areas of right lung scarring or atelectasis with associated diaphragmatic base pleural thickening/scarring. Findings stable from the prior chest radiograph. Electronically Signed   By: Lajean Manes M.D.   On: 04/21/2021 15:34   DG Foot 2 Views Right  Result Date: 04/21/2021 CLINICAL DATA:  Nonhealing ulcer right foot EXAM: RIGHT FOOT - 2 VIEW COMPARISON:  10/21/2020 FINDINGS: There is previous surgical removal of distal portion of fifth metatarsal and fifth toe. No definite recent fracture is seen. There is subtle low-density in the head of the fourth metatarsal. No recent fracture or dislocation is seen. Plantar spur is seen in the calcaneus. Extensive arterial calcifications are seen. There is soft tissue swelling along the plantar aspect of forefoot. There  is air in the soft tissues along the plantar aspect of forefoot. IMPRESSION: There is faint lucency in the head of the right fourth metatarsal. Possibility of osteomyelitis is not excluded. Follow-up MRI as clinically warranted should be considered. Electronically Signed   By: Elmer Picker M.D.   On: 04/21/2021 14:51   CT PANCREAS ABD W/WO  Result Date: 04/24/2021 CLINICAL DATA:  Follow-up acute pancreatitis. EXAM: CT ABDOMEN WITHOUT AND WITH CONTRAST TECHNIQUE: Multidetector CT imaging of the abdomen was performed following the standard protocol before and following the bolus administration of intravenous contrast. RADIATION DOSE REDUCTION: This exam was performed according to the departmental dose-optimization program which includes automated exposure control, adjustment of the mA and/or kV according to patient size and/or use of iterative reconstruction technique. CONTRAST:  133mL OMNIPAQUE IOHEXOL 300 MG/ML  SOLN COMPARISON:  CT scan 04/21/2021 FINDINGS: Lower chest: Stable mild cardiac enlargement. Significant age advanced aortic and coronary artery calcifications. Persistent area of probable rounded atelectasis at the right lung base and slightly progressive atelectasis at the left lung base. Hepatobiliary: No hepatic lesions or intrahepatic biliary dilatation. Gallbladder is mildly contracted and demonstrates uniform wall thickening. No common bile duct dilatation. Pancreas: Persistent changes of pancreatitis involving the pancreatic head which is edematous and slightly enlarged but normal enhancement without findings for pancreatic necrosis. Spleen: Normal size.  No focal lesions. Adrenals/Urinary Tract: Stable left adrenal gland nodules. No worrisome renal lesions or hydronephrosis. Extensive renal vascular calcifications again demonstrated. Stomach/Bowel: The stomach, duodenum, small bowel and colon are grossly normal. Vascular/Lymphatic: Significant age advanced vascular disease. No mesenteric  or retroperitoneal mass or adenopathy. Other: No ascites or abdominal wall hernia. Musculoskeletal: No significant bony findings. IMPRESSION: 1. Persistent changes of pancreatitis involving the pancreatic head which is edematous and slightly enlarged but normal enhancement without findings for pancreatic necrosis. 2. Stable left adrenal gland nodules. 3. Stable area of probable rounded atelectasis at the right lung base and slightly progressive atelectasis at the left lung base. 4. Significant age advanced vascular disease. Aortic Atherosclerosis (ICD10-I70.0). Electronically Signed   By: Marijo Sanes M.D.   On: 04/24/2021 17:43   [place] EKG SITE RITE  Result Date: 04/24/2021 If [facility] image not attached, placement could not be confirmed due to current cardiac rhythm.  US Abdomen Limited RUQ (LIVER/GB)  Result Date: 04/21/2021 CLINICAL DATA:  Right upper quadrant pain for 3 days. EXAM: ULTRASOUND ABDOMEN LIMITED RIGHT UPPER QUADRANT COMPARISON:  None. FINDINGS: Gallbladder: No gallstones or wall thickening visualized. No sonographic Murphy sign noted by sonographer. Common bile duct: Diameter: 3 mm Liver: No focal lesion identified. Within normal limits in parenchymal echogenicity. Portal vein is patent on color Doppler imaging with normal direction of blood flow towards the  liver. Other: None. IMPRESSION: Normal right upper quadrant ultrasound. Electronically Signed   By: Lajean Manes M.D.   On: 04/21/2021 12:51   SLEEP STUDY DOCUMENTS  Result Date: 04/13/2021 Ordered by an unspecified provider.   Assessment and Plan:   [name] is a 51 y.o. female with metabolic syndrome, end-stage renal disease on hemodialysis who is admitted with acute pancreatitis of unclear etiology.   Acute pancreatitis, uncomplicated: Unclear etiology Repeat CT pancreas protocol revealed persistent changes of pancreatitis involving the pancreatic head which is edematous and slightly enlarged but normal enhancement  without findings of pancreatic necrosis No history of alcohol use, no evidence of cholelithiasis or choledocholithiasis, serum triglycerides mildly elevated Encourage oral hydration Small frequent meals IgG4 levels are normal    Thank you for involving me in the care of this patient.  GI will sign off at this time, please call us back with questions or concerns   Dr Sherri Sear, MD

## 2021-04-26 NOTE — Progress Notes (Signed)
[name] ? ?MRN: [id number] ? ?DOB/AGE: 50-03-1970 50 y.o. ? ?Primary Care Physician:[name], NP ? ?Admit date: 04/21/2021 ? ?Chief Complaint:  ?Chief Complaint  ?Patient presents with  ? Abdominal Pain  ? Nausea  ? ? ?S-Pt presented on  04/21/2021 with  ?Chief Complaint  ?Patient presents with  ? Abdominal Pain  ? Nausea  ?[name] ?Patient is a 51 year old African-American female with a past medical history of end-stage renal disease, patient is on Tuesday Thursday Saturday schedule, diabetes mellitus, history of pancreatitis who came to the ER with chief complaint of abdominal pain. ? ?Patient seen and evaluated during dialysis ?  ?HEMODIALYSIS FLOWSHEET: ? ?Blood Flow Rate (mL/min): 350 mL/min ?Arterial Pressure (mmHg): -210 mmHg ?Venous Pressure (mmHg): 220 mmHg ?Transmembrane Pressure (mmHg): 30 mmHg ?Ultrafiltration Rate (mL/min): 830 mL/min ?Dialysate Flow Rate (mL/min): 500 ml/min ?Conductivity: Machine : 14 ?Conductivity: Machine : 14 ?Dialysis Fluid Bolus: Normal Saline ?Bolus Amount (mL): 250 mL ? ?Continues to complain of mild abdominal pain ?Tolerating solid foods ? ? ?EXB:MWUXL from the symptoms mentioned above,there are no other symptoms referable to all systems reviewed. ? ?Physical Exam: ?Vital signs in last 24 hours: ?Temp:  [98 ?F (36.7 ?C)-98.7 ?F (37.1 ?C)] 98.4 ?F (36.9 ?C) (03/02 1017) ?Pulse Rate:  [82-89] 84 (03/02 1100) ?Resp:  [16-24] 18 (03/02 1145) ?BP: (104-151)/(48-113) 136/76 (03/02 1145) ?SpO2:  [91 %-97 %] 91 % (03/02 1100) ?Weight:  [244 kg] 118 kg (03/02 1017) ?Weight change:  ?Last BM Date : 04/21/21 ? ?Intake/Output from previous day: ?03/01 0701 - 03/02 0700 ?In: 620 [P.O.:620] ?Out: -  ?No intake/output data recorded. ? ? ?Physical Exam: ? ?General- pt is awake,alert, oriented to time place and person ? ?Resp- No acute REsp distress, CTA B/L NO Rhonchi ? ?CVS- S1S2 regular in rate and rhythm ? ?GIT- BS+, soft, minimal tenderness  , Non distended ? ?EXT- trace LE Edema,   No Cyanosis right foot dressing ? ?Access-  LUE AVF ? ?Lab Results: ? ?CBC ? ?Recent Labs  ?  04/25/21 ?0625  ?WBC 6.2  ?HGB 11.8*  ?HCT 37.9  ?PLT 267  ? ? ? ?BMET ? ?Recent Labs  ?  04/25/21 ?0625 04/26/21 ?0616  ?NA 130* 130*  ?K 4.9 5.0  ?CL 89* 90*  ?CO2 25 24  ?GLUCOSE 130* 135*  ?BUN 39* 57*  ?CREATININE 8.34* 10.04*  ?CALCIUM 8.8* 9.1  ? ? ? ? ? ?Most recent Creatinine trend  ?Lab Results  ?Component Value Date  ? CREATININE 10.04 (H) 04/26/2021  ? CREATININE 8.34 (H) 04/25/2021  ? CREATININE 11.56 (H) 04/24/2021  ? ?  ? ? ?MICRO ? ? ?Recent Results (from the past 240 hour(s))  ?Resp Panel by RT-PCR (Flu A&B, Covid) Nasopharyngeal Swab     Status: None  ? Collection Time: 04/21/21 12:16 PM  ? Specimen: Nasopharyngeal Swab; Nasopharyngeal(NP) swabs in vial transport medium  ?Result Value Ref Range Status  ? SARS Coronavirus 2 by RT PCR NEGATIVE NEGATIVE Final  ?  Comment: (NOTE) ?SARS-CoV-2 target nucleic acids are NOT DETECTED. ? ?The SARS-CoV-2 RNA is generally detectable in upper respiratory ?specimens during the acute phase of infection. The lowest ?concentration of SARS-CoV-2 viral copies this assay can detect is ?138 copies/mL. A negative result does not preclude SARS-Cov-2 ?infection and should not be used as the sole basis for treatment or ?other patient management decisions. A negative result may occur with  ?improper specimen collection/handling, submission of specimen other ?than nasopharyngeal swab, presence of viral  mutation(s) within the ?areas targeted by this assay, and inadequate number of viral ?copies(<138 copies/mL). A negative result must be combined with ?clinical observations, patient history, and epidemiological ?information. The expected result is Negative. ? ?Fact Sheet for Patients:  ?EntrepreneurPulse.com.au ? ?Fact Sheet for Healthcare Providers:  ?IncredibleEmployment.be ? ?This test is no t yet approved or cleared by the [place] FDA and   ?has been authorized for detection and/or diagnosis of SARS-CoV-2 by ?FDA under an Emergency Use Authorization (EUA). This EUA will remain  ?in effect (meaning this test can be used) for the duration of the ?COVID-19 declaration under Section 564(b)(1) of the Act, 21 ?U.S.C.section 360bbb-3(b)(1), unless the authorization is terminated  ?or revoked sooner.  ? ? ?  ? Influenza A by PCR NEGATIVE NEGATIVE Final  ? Influenza B by PCR NEGATIVE NEGATIVE Final  ?  Comment: (NOTE) ?The Xpert Xpress SARS-CoV-2/FLU/RSV plus assay is intended as an aid ?in the diagnosis of influenza from Nasopharyngeal swab specimens and ?should not be used as a sole basis for treatment. Nasal washings and ?aspirates are unacceptable for Xpert Xpress SARS-CoV-2/FLU/RSV ?testing. ? ?Fact Sheet for Patients: ?EntrepreneurPulse.com.au ? ?Fact Sheet for Healthcare Providers: ?IncredibleEmployment.be ? ?This test is not yet approved or cleared by the [place] FDA and ?has been authorized for detection and/or diagnosis of SARS-CoV-2 by ?FDA under an Emergency Use Authorization (EUA). This EUA will remain ?in effect (meaning this test can be used) for the duration of the ?COVID-19 declaration under Section 564(b)(1) of the Act, 21 U.S.C. ?section 360bbb-3(b)(1), unless the authorization is terminated or ?revoked. ? ?Performed at [facility], [place], ?[place] [address] ?  ?MRSA Next Gen by PCR, Nasal     Status: None  ? Collection Time: 04/23/21  6:55 AM  ? Specimen: Nasal Mucosa; Nasal Swab  ?Result Value Ref Range Status  ? MRSA by PCR Next Gen NOT DETECTED NOT DETECTED Final  ?  Comment: (NOTE) ?The GeneXpert MRSA Assay (FDA approved for NASAL specimens only), ?is one component of a comprehensive MRSA colonization surveillance ?program. It is not intended to diagnose MRSA infection nor to guide ?or monitor treatment for MRSA infections. ?Test performance is not FDA approved in  patients less than 2 years ?old. ?Performed at [facility], [place], ?[place] [address] ?  ?  ? ?CT abdomen done on April 21, 2021. ?IMPRESSION: ?Enlarged pancreatic head with adjacent peripancreatic soft tissue ?stranding. This is suspicious for focal pancreatitis, although ?pancreatic carcinoma cannot definitely be excluded. Recommend ?correlation with serum lipase level, and continued follow-up by ?pancreatic protocol MRI or CT without and with contrast. ?  ?Right-sided pleural thickening and rounded opacity in posterior ?right lower lobe, new since 2016 exam. Rounded atelectasis is ?favored, however pulmonary infiltrate or neoplasm cannot definitely ?be excluded. Recommend clinical correlation, and short-term ?follow-up with chest CT or PET-CT. ? ? ?Impression: ? ? ? ?Patient is well-known to our practice as patient undergoes dialysis at [facility] dialysis under care of Dr. Murlean Iba ?Patient is on Tuesday Thursday Saturday schedule ?Patient has a target dry weight of 119.5 kg ? ?1)  End-stage renal disease on dialysis. ? Receiving dialysis with low UF at the request of GI. UF goal 2L as tolerated. Next treatment scheduled for Saturday. ? ?2)HTN with chronic kidney disease ? Currently only receiving midodrine 20 mg as needed for low blood pressure.  ? BP 133/48 during dialysis ? ?3)Anemia of chronic disease ? ?CBC Latest Ref  Rng & Units 04/25/2021 04/23/2021 04/22/2021  ?WBC 4.0 - 10.5 K/uL 6.2 6.6 8.9  ?Hemoglobin 12.0 - 15.0 g/dL 11.8(L) 13.5 12.2  ?Hematocrit 36.0 - 46.0 % 37.9 44.1 40.4  ?Platelets 150 - 400 K/uL 267 295 301  ?  ?Patient did receive Mircera as outpatient ?Hemoglobin stable ? ?4) Secondary hyperparathyroidism -CKD Mineral-Bone Disorder/Renal failure associated hyperphosphatemia  ? ?Lab Results  ?Component Value Date  ? PTH 134 (H) 04/21/2017  ? CALCIUM 9.1 04/26/2021  ? CAION 0.81 (LL) 10/26/2020  ? PHOS 8.2 (H) 04/26/2021  ? ?Calcium at goal, phosphorus  elevated. Binders held due to pancreatitis.  ? ?5)Pancreatitis ? Tolerating advanced diet. Continue supportive care ? ? ? ?[place] ?04/26/2021, 12:15 PM  ?

## 2021-04-26 NOTE — Consult Note (Signed)
[facility]    Date of Admission:  04/21/2021     Reason for Consult:osteomyelitis     Referring Physician: Dr Ouida Sills  Current antibiotics: None   ASSESSMENT:    51 y.o. female admitted with:  Right foot osteomyelitis: She has extensive disease in her foot with significant gangrenous and ischemic changes that have been chronic.  This is following prior OM in the same foot with 5th ray amputation/I&D (August 2022) treated with 8 weeks of antibiotics.  She is not septic with no fevers or WBC so antibiotics are being held.  She has an incurable condition in her foot and antibiotics alone will not effectively deal with this dilemma.  She really needs amputation but consistently declines this intervention.  Diabetes: A1c is 6.2. ESRD: She is on HD.  Obesity: Body mass index is 44.65 kg/m.   RECOMMENDATIONS:    Hold antibiotics Await bone cultures planned for tomorrow Wound care Glycemic control Will follow   Principal Problem:   Pancreatitis Active Problems:   Obesity, Class III, BMI 40-49.9 (morbid obesity) (HCC)   Type 2 diabetes mellitus with ESRD (end-stage renal disease) (HCC)   COPD with chronic bronchitis (HCC)   ESRD (end stage renal disease) (HCC)   Chronic respiratory failure with hypoxia (HCC)   Diabetic foot ulcer (HCC)   Hyponatremia   Metabolic acidosis, increased anion gap   GERD (gastroesophageal reflux disease)   Acute hematogenous osteomyelitis of right foot (HCC)   MEDICATIONS:    Scheduled Meds:  arformoterol  15 mcg Nebulization Q12H   budesonide  0.5 mg Nebulization BID   Chlorhexidine Gluconate Cloth  6 each Topical Q0600   citalopram  20 mg Oral QHS   ferric citrate  210 mg Oral TID WC   heparin  5,000 Units Subcutaneous Q8H   insulin aspart  0-15 Units Subcutaneous TID WC   insulin glargine-yfgn  18 Units Subcutaneous QHS   linaclotide  290 mcg Oral q morning   loratadine  10 mg Oral QHS   multivitamin  1  tablet Oral QHS   pantoprazole  40 mg Oral QHS   povidone-iodine   Topical Daily   revefenacin  175 mcg Nebulization Daily   sodium chloride flush  3 mL Intravenous Q12H   Continuous Infusions: PRN Meds:.acetaminophen **OR** acetaminophen, albuterol, HYDROmorphone (DILAUDID) injection, lactulose, lidocaine-prilocaine, midodrine, ondansetron (ZOFRAN) IV, oxyCODONE  HPI:    [name] is a 51 y.o. female with PMHx of HTN, COPD, ESRD on HD, DM2, prior OM of right foot s/p 5th ray amputation and 8 week course of antibiotics admitted with pancreatitis.  She also noted to have ulcerations of her right foot which led to MRI.  She reported no increased pain or drainage.  This showed OM of the fourth metatarsal head and base of fourth proximal phalynx.  There was also edema in the 3rd metatarsal head and base of the proximal phalanx possibly reactive vs OM.  MRI additionally showed severe bone marrow edema in the first proximal phalanx and second proximal phalanx concerning for OM.  She has had long standing issues with non-healing wounds and dry gangrene.  Vascular surgery has evaluated her and has no vascular interventions to offer.  Podiatry has told her the ischemic changes are irreversible and that she needs amputation.  She has declined amputation to this point. Podiatry saw her yesterday and plans for a bone culture on Friday to guide antibiotics.   She denies fevers, chills.  She reports no worsening foot pain and that she does put some weight on her foot.  SHe reports that she is admitted with pancreatitis.   Past Medical History:  Diagnosis Date   (HFpEF) heart failure with preserved ejection fraction ([place])    a. 11/2014 Echo: EF 50-55%, Gr1 DD; b. 06/2016 Echo: EF 60-65%, no rwma, mod dil LA, nl RV, triv eff.   Anxiety    Asthma    Bronchitis    CHF (congestive heart failure) (HCC)    Chronic cough    Chronic respiratory failure with hypoxia and hypercapnia ([place])    a. Home O2.  (2-4L)    CKD (chronic kidney disease), stage III (HCC)    Depression    Diabetes mellitus without complication (HCC)    Diverticulosis    Dyspnea    Dysrhythmia    per pt, no arrythmias   Environmental and seasonal allergies    Extreme obesity with alveolar hypoventilation (HCC)    Hypertension    Iron deficiency anemia    a. chronic blood loss->heavy menses.   Kidney failure    dialysis tues/thur/sat   ... left arm shunt   Orthopnea    Oxygen dependent    3l/min   Pericardial effusion    a. 11/2014 CT Chest: Mod effusion; b. 11/2014 Echo: small to mod circumferential effusion. No hemodynamic compromise.   Pneumonia    history   Poorly controlled diabetes mellitus ([place])    a. 11/2014 A1c 13.2.   Swelling of both lower extremities     Social History   Tobacco Use   Smoking status: Never   Smokeless tobacco: Never  Vaping Use   Vaping Use: Never used  Substance Use Topics   Alcohol use: Not Currently    Comment: occasional   Drug use: Yes    Types: Marijuana    Family History  Problem Relation Age of Onset   Diabetes Mother    Hypertension Mother    Hypertension Father    Diabetes Father    Hypertension Other    Diabetes Other    Breast cancer Maternal Aunt 75   Breast cancer Maternal Aunt 50    Allergies  Allergen Reactions   Dust Mite Extract Shortness Of Breath and Itching   Mold Extract [Trichophyton] Shortness Of Breath and Itching   Pollen Extract Shortness Of Breath    Itching, shortness of breath, asthma like symptoms   Sulfa Antibiotics Itching   Feraheme [Ferumoxytol] Itching    Uses benadryl so she can get the medicine    Review of Systems  All other systems reviewed and are negative. Except as noted in HPI.   OBJECTIVE:   Blood pressure (!) 151/73, pulse 82, temperature 98.1 F (36.7 C), temperature source Oral, resp. rate 20, height 5\' 4"  (1.626 m), weight 119.2 kg, last menstrual period 09/06/2016, SpO2 95 %. Body mass index is 45.11  kg/m.  Physical Exam Constitutional:      General: She is not in acute distress.    Appearance: She is well-developed.     Comments: Patient seen while on HD.  She is in no distress and not ill appearing.   HENT:     Head: Normocephalic and atraumatic.     Nose: Nose normal.  Eyes:     Extraocular Movements: Extraocular movements intact.     Conjunctiva/sclera: Conjunctivae normal.  Pulmonary:     Effort: Pulmonary effort is normal. No respiratory distress.  Abdominal:     General:  There is no distension.     Palpations: Abdomen is soft. There is no mass.  Musculoskeletal:     Cervical back: Normal range of motion and neck supple.     Comments: Right foot is wrapped in Ace bandage.  Images reviewed and prior WOC notes reviewed.   Skin:    General: Skin is warm and dry.     Findings: No rash.  Neurological:     General: No focal deficit present.     Mental Status: She is alert and oriented to person, place, and time.  Psychiatric:        Mood and Affect: Mood normal.        Behavior: Behavior normal.     Lab Results: Lab Results  Component Value Date   WBC 6.2 04/25/2021   HGB 11.8 (L) 04/25/2021   HCT 37.9 04/25/2021   MCV 91.3 04/25/2021   PLT 267 04/25/2021    Lab Results  Component Value Date   NA 130 (L) 04/26/2021   K 5.0 04/26/2021   CO2 24 04/26/2021   GLUCOSE 135 (H) 04/26/2021   BUN 57 (H) 04/26/2021   CREATININE 10.04 (H) 04/26/2021   CALCIUM 9.1 04/26/2021   GFRNONAA 4 (L) 04/26/2021   GFRAA 15 (L) 04/26/2017    Lab Results  Component Value Date   ALT 19 04/21/2021   AST 20 04/21/2021   ALKPHOS 357 (H) 04/21/2021   BILITOT 0.6 04/21/2021       Component Value Date/Time   CRP 4.7 (H) 04/21/2021 1730       Component Value Date/Time   ESRSEDRATE 77 (H) 02/22/2021 1334    I have reviewed the micro and lab results in Epic.  Imaging: MR FOOT RIGHT WO CONTRAST  Result Date: 04/25/2021 CLINICAL DATA:  Possible osteomyelitis of the right  fourth metatarsal. Prior fifth metatarsal amputation. EXAM: MRI OF THE RIGHT FOREFOOT WITHOUT CONTRAST TECHNIQUE: Multiplanar, multisequence MR imaging of the right forefoot was performed. No intravenous contrast was administered. COMPARISON:  None. FINDINGS: Bones/Joint/Cartilage Prior amputation of the distal half of the fifth metatarsal and phalanges. Soft tissue wound along the lateral forefoot overlying the fourth MTP joint. Bone destruction and marrow edema within the fourth metatarsal head and base of the fourth proximal phalanx concerning for osteomyelitis. Bone marrow edema in the third metatarsal head and base of the third proximal phalanx which may be reactive versus osteomyelitis. Severe bone marrow edema in the first proximal phalanx concerning for osteomyelitis. Severe bone marrow edema in the second proximal phalanx concerning for osteomyelitis. Moderate osteoarthritis of the first MTP joint. Bone marrow edema in the medial and lateral hallux sesamoids likely secondary to arthritis. Old healed second metatarsal neck fracture. Old healed third metatarsal neck fracture. Hallux valgus.  No joint effusion. Ligaments Collateral ligaments are intact.  Lisfranc ligament is intact. Muscles and Tendons Flexor, peroneal and extensor compartment tendons are intact. Diffuse T2 hyperintense signal throughout the plantar musculature likely neurogenic with atrophy. Soft tissue Large soft tissue wound along the plantar aspect of the medial aspect of the midfoot. No soft tissue mass. IMPRESSION: 1. Soft tissue wound along the lateral forefoot overlying the fourth MTP joint. Osteomyelitis of the fourth metatarsal head and base of the fourth proximal phalanx. 2. Bone marrow edema in the third metatarsal head and base of the third proximal phalanx which may be reactive versus osteomyelitis. 3. Severe bone marrow edema in the first proximal phalanx and second proximal phalanx concerning for osteomyelitis. 4. Moderate  osteoarthritis of the first MTP joint. Bone marrow edema in the medial and lateral hallux sesamoids likely secondary to arthritis. Electronically Signed   By: Kathreen Devoid M.D.   On: 04/25/2021 08:29   CT PANCREAS ABD W/WO  Result Date: 04/24/2021 CLINICAL DATA:  Follow-up acute pancreatitis. EXAM: CT ABDOMEN WITHOUT AND WITH CONTRAST TECHNIQUE: Multidetector CT imaging of the abdomen was performed following the standard protocol before and following the bolus administration of intravenous contrast. RADIATION DOSE REDUCTION: This exam was performed according to the departmental dose-optimization program which includes automated exposure control, adjustment of the mA and/or kV according to patient size and/or use of iterative reconstruction technique. CONTRAST:  129mL OMNIPAQUE IOHEXOL 300 MG/ML  SOLN COMPARISON:  CT scan 04/21/2021 FINDINGS: Lower chest: Stable mild cardiac enlargement. Significant age advanced aortic and coronary artery calcifications. Persistent area of probable rounded atelectasis at the right lung base and slightly progressive atelectasis at the left lung base. Hepatobiliary: No hepatic lesions or intrahepatic biliary dilatation. Gallbladder is mildly contracted and demonstrates uniform wall thickening. No common bile duct dilatation. Pancreas: Persistent changes of pancreatitis involving the pancreatic head which is edematous and slightly enlarged but normal enhancement without findings for pancreatic necrosis. Spleen: Normal size.  No focal lesions. Adrenals/Urinary Tract: Stable left adrenal gland nodules. No worrisome renal lesions or hydronephrosis. Extensive renal vascular calcifications again demonstrated. Stomach/Bowel: The stomach, duodenum, small bowel and colon are grossly normal. Vascular/Lymphatic: Significant age advanced vascular disease. No mesenteric or retroperitoneal mass or adenopathy. Other: No ascites or abdominal wall hernia. Musculoskeletal: No significant bony  findings. IMPRESSION: 1. Persistent changes of pancreatitis involving the pancreatic head which is edematous and slightly enlarged but normal enhancement without findings for pancreatic necrosis. 2. Stable left adrenal gland nodules. 3. Stable area of probable rounded atelectasis at the right lung base and slightly progressive atelectasis at the left lung base. 4. Significant age advanced vascular disease. Aortic Atherosclerosis (ICD10-I70.0). Electronically Signed   By: Marijo Sanes M.D.   On: 04/24/2021 17:43   [place] EKG SITE RITE  Result Date: 04/24/2021 If [facility] image not attached, placement could not be confirmed due to current cardiac rhythm.    Imaging independently reviewed in Epic.  [facility] [phone] pager 04/26/2021, 7:56 AM

## 2021-04-26 NOTE — Progress Notes (Signed)
Patient completes scheduled Hemodialysis treatment. LUA AVF cannulates with ease, but unable to maintain the prescribed BFR, reduced to 350 to maintain safe parameters for treatment. Venous pressures elevated at initiation, needle adjusted but remained elevated. Targeted UF not met, due to high pressures, the decision was made to rinse back patient to avoid blood loss. Patient medicated for pain during course of treatment, with adequate relief. Patient returned to assigned room, report given to primary nurse.  ?

## 2021-04-26 NOTE — Progress Notes (Signed)
PROGRESS NOTE  AVERIANNA [name]    DOB: May 18, 1970, 51 y.o.  VOJ:[id number]    Code Status: Full Code   DOA: 04/21/2021   LOS: 5   Brief hospital course  [name] is a 51 y.o. female with a PMH significant for HTN, COPD on 3 L nasal cannula chronically, ESRD on HD TTS, type II DM, known osteomyelitis of right foot s/p fifth ray amputation with poorly healing ulcers. They presented from home to the ED on 04/21/2021 with epigastric pain, nausea, vomiting x 4 days.  At the same time, she also had increased sputum production with cough so was started on azithromycin but unable to tolerate the full treatment. In the ED, it was found that they had pancreatitis and likely COPD exacerbation.  They were treated with analgesia, bowel rest, IV fluids.  GI was consulted. Imaging suggested osteomyelitis of right foot. Additionally, she was mildly hyponatremic and hyperkalemic due to missing several dialysis sessions. Patient was admitted to medicine service for further workup and management of pancreatitis, osteomyelitis, electrolyte abnormalities as outlined in detail below.  04/26/21 -stable, improved  Assessment & Plan  Principal Problem:   Pancreatitis Active Problems:   Obesity, Class III, BMI 40-49.9 (morbid obesity) (HCC)   Type 2 diabetes mellitus with ESRD (end-stage renal disease) (HCC)   COPD with chronic bronchitis (HCC)   ESRD (end stage renal disease) (HCC)   Chronic respiratory failure with hypoxia (HCC)   Diabetic foot ulcer (HCC)   Hyponatremia   Metabolic acidosis, increased anion gap   GERD (gastroesophageal reflux disease)   Acute hematogenous osteomyelitis of right foot ([place])  Pancreatitis- improving. Patient still endorsing epigastric pain but is tolerating soft diet well. Repeat CT abdomen 2/28 showed normal progression of pancreatitis without necrosis.  - GI signed off - advance diet as tolerated - continue analgesia PRN and taper off as tolerated   Metabolic  acidosis, increased anion gap Resolved after HD. Acute.  On admission CO2 19 and anion gap 22.  Glucose levels were within normal limits.  Suspect secondary to missed hemodialysis sessions. -Continue to monitor   Hyponatremia- stable- Na+ 130   Diabetic foot ulcer   osteomyelitis- previously diagnosed with gangrene and associated osteomyelitis requiring fifth ray amputation of the foot 09/2020. - xray suggestive of osteo> MRI confirmed osteomyelitis - patient refuses further amputation. Consulted ID for recommendations in guiding Abx course. She will receive IV Abx at HD for duration of therapy - podiatry consulted for biopsy to guide Abx course. Holding off on Abx until Bx taken  - plan for bx 3/3 - wound care   Chronic respiratory failure with hypoxia ([place])- chronic, stable Chronic respiratory failure on 3 L nasal cannula oxygen 24/7 with O2 saturations currently maintained.  Patient had recently had a sleep study done in the outpatient setting, but has not heard back regarding results.  CT imaging did note concern for right-sided pleural effusion. -Patient is currently at baseline O2 requirements at 3-4L by [place] to maintain saturation of 92% -Fluid management with dialysis -Follow-up with pulmonology in outpatient setting regarding sleep study - bronchodilators PRN - complete azithromycin course   ESRD on HD TThS   hyperkalemia (resolved)-  - nephrology managing, appreciate recs   Type 2 diabetes mellitus with ESRD (end-stage renal disease) ([place])- will impact wound healing - sSSI - semglee daily   GERD  Body mass index is 45.11 kg/m.  VTE ppx: heparin injection 5,000 Units Start: 04/22/21 0045   Diet:  Diet   Diet NPO time specified   Subjective 04/26/21    Pt reports feeling overall well. She continues to complain of epigastric pain but is tolerating diet without vomiting. States it has been almost a week since last BM. Wants to continue to take Linzess for this Wants  to see her pulmonologist about sleep study   Objective   Vitals:   04/25/21 2014 04/26/21 0024 04/26/21 0517 04/26/21 0724  BP: 126/60 (!) 133/48 (!) 104/59 (!) 151/73  Pulse: 82 89 82 82  Resp: 19 18 18 20   Temp: 98.3 F (36.8 C) 98 F (36.7 C) 98 F (36.7 C) 98.1 F (36.7 C)  TempSrc:    Oral  SpO2: 97% 97% 96% 95%  Weight:      Height:        Intake/Output Summary (Last 24 hours) at 04/26/2021 0739 Last data filed at 04/25/2021 1900 Gross per 24 hour  Intake 620 ml  Output --  Net 620 ml    Filed Weights   04/24/21 0900 04/24/21 0940 04/24/21 1329  Weight: 120.6 kg 121.8 kg 119.2 kg     Physical Exam:  General: awake, alert, NAD HEENT: atraumatic, clear conjunctiva, anicteric sclera, MMM, hearing grossly normal Respiratory: normal respiratory effort. Gastrointestinal: soft, tender to epigastric area Nervous: A&O x3. no gross focal neurologic deficits, normal speech Extremities: R foot with packed ulcers on plantar and dorsal surfaces. Poor perfusion. Poor sensation.  Skin: dry, intact, normal temperature. Lesions on face Psychiatry: normal mood, congruent affect  Labs   I have personally reviewed the following labs and imaging studies CBC    Component Value Date/Time   WBC 6.2 04/25/2021 0625   RBC 4.15 04/25/2021 0625   HGB 11.8 (L) 04/25/2021 0625   HGB 12.4 01/11/2014 1324   HCT 37.9 04/25/2021 0625   HCT 39.2 01/11/2014 1324   PLT 267 04/25/2021 0625   PLT 370 01/11/2014 1324   MCV 91.3 04/25/2021 0625   MCV 86 01/11/2014 1324   MCH 28.4 04/25/2021 0625   MCHC 31.1 04/25/2021 0625   RDW 14.9 04/25/2021 0625   RDW 15.1 (H) 01/11/2014 1324   LYMPHSABS 0.9 04/25/2021 0625   LYMPHSABS 1.8 01/11/2014 1324   MONOABS 0.6 04/25/2021 0625   MONOABS 0.5 01/11/2014 1324   EOSABS 0.2 04/25/2021 0625   EOSABS 0.2 01/11/2014 1324   BASOSABS 0.0 04/25/2021 0625   BASOSABS 0.1 01/11/2014 1324   BMP Latest Ref Rng & Units 04/26/2021 04/25/2021 04/24/2021  Glucose  70 - 99 mg/dL 135(H) 130(H) 117(H)  BUN 6 - 20 mg/dL 57(H) 39(H) 62(H)  Creatinine 0.44 - 1.00 mg/dL 10.04(H) 8.34(H) 11.56(H)  Sodium 135 - 145 mmol/L 130(L) 130(L) 128(L)  Potassium 3.5 - 5.1 mmol/L 5.0 4.9 5.6(H)  Chloride 98 - 111 mmol/L 90(L) 89(L) 91(L)  CO2 22 - 32 mmol/L 24 25 23   Calcium 8.9 - 10.3 mg/dL 9.1 8.8(L) 8.5(L)    MR FOOT RIGHT WO CONTRAST  Result Date: 04/25/2021 CLINICAL DATA:  Possible osteomyelitis of the right fourth metatarsal. Prior fifth metatarsal amputation. EXAM: MRI OF THE RIGHT FOREFOOT WITHOUT CONTRAST TECHNIQUE: Multiplanar, multisequence MR imaging of the right forefoot was performed. No intravenous contrast was administered. COMPARISON:  None. FINDINGS: Bones/Joint/Cartilage Prior amputation of the distal half of the fifth metatarsal and phalanges. Soft tissue wound along the lateral forefoot overlying the fourth MTP joint. Bone destruction and marrow edema within the fourth metatarsal head and base of the fourth proximal phalanx concerning for osteomyelitis. Bone  marrow edema in the third metatarsal head and base of the third proximal phalanx which may be reactive versus osteomyelitis. Severe bone marrow edema in the first proximal phalanx concerning for osteomyelitis. Severe bone marrow edema in the second proximal phalanx concerning for osteomyelitis. Moderate osteoarthritis of the first MTP joint. Bone marrow edema in the medial and lateral hallux sesamoids likely secondary to arthritis. Old healed second metatarsal neck fracture. Old healed third metatarsal neck fracture. Hallux valgus.  No joint effusion. Ligaments Collateral ligaments are intact.  Lisfranc ligament is intact. Muscles and Tendons Flexor, peroneal and extensor compartment tendons are intact. Diffuse T2 hyperintense signal throughout the plantar musculature likely neurogenic with atrophy. Soft tissue Large soft tissue wound along the plantar aspect of the medial aspect of the midfoot. No soft  tissue mass. IMPRESSION: 1. Soft tissue wound along the lateral forefoot overlying the fourth MTP joint. Osteomyelitis of the fourth metatarsal head and base of the fourth proximal phalanx. 2. Bone marrow edema in the third metatarsal head and base of the third proximal phalanx which may be reactive versus osteomyelitis. 3. Severe bone marrow edema in the first proximal phalanx and second proximal phalanx concerning for osteomyelitis. 4. Moderate osteoarthritis of the first MTP joint. Bone marrow edema in the medial and lateral hallux sesamoids likely secondary to arthritis. Electronically Signed   By: Kathreen Devoid M.D.   On: 04/25/2021 08:29   CT PANCREAS ABD W/WO  Result Date: 04/24/2021 CLINICAL DATA:  Follow-up acute pancreatitis. EXAM: CT ABDOMEN WITHOUT AND WITH CONTRAST TECHNIQUE: Multidetector CT imaging of the abdomen was performed following the standard protocol before and following the bolus administration of intravenous contrast. RADIATION DOSE REDUCTION: This exam was performed according to the departmental dose-optimization program which includes automated exposure control, adjustment of the mA and/or kV according to patient size and/or use of iterative reconstruction technique. CONTRAST:  170mL OMNIPAQUE IOHEXOL 300 MG/ML  SOLN COMPARISON:  CT scan 04/21/2021 FINDINGS: Lower chest: Stable mild cardiac enlargement. Significant age advanced aortic and coronary artery calcifications. Persistent area of probable rounded atelectasis at the right lung base and slightly progressive atelectasis at the left lung base. Hepatobiliary: No hepatic lesions or intrahepatic biliary dilatation. Gallbladder is mildly contracted and demonstrates uniform wall thickening. No common bile duct dilatation. Pancreas: Persistent changes of pancreatitis involving the pancreatic head which is edematous and slightly enlarged but normal enhancement without findings for pancreatic necrosis. Spleen: Normal size.  No focal  lesions. Adrenals/Urinary Tract: Stable left adrenal gland nodules. No worrisome renal lesions or hydronephrosis. Extensive renal vascular calcifications again demonstrated. Stomach/Bowel: The stomach, duodenum, small bowel and colon are grossly normal. Vascular/Lymphatic: Significant age advanced vascular disease. No mesenteric or retroperitoneal mass or adenopathy. Other: No ascites or abdominal wall hernia. Musculoskeletal: No significant bony findings. IMPRESSION: 1. Persistent changes of pancreatitis involving the pancreatic head which is edematous and slightly enlarged but normal enhancement without findings for pancreatic necrosis. 2. Stable left adrenal gland nodules. 3. Stable area of probable rounded atelectasis at the right lung base and slightly progressive atelectasis at the left lung base. 4. Significant age advanced vascular disease. Aortic Atherosclerosis (ICD10-I70.0). Electronically Signed   By: Marijo Sanes M.D.   On: 04/24/2021 17:43   [place] EKG SITE RITE  Result Date: 04/24/2021 If [facility] image not attached, placement could not be confirmed due to current cardiac rhythm.   Disposition Plan & Communication  Patient status: Inpatient  Admitted From: Home Planned disposition location: Home Anticipated discharge date: 3/7 pending biopsy  and antibiotic choice  Family Communication: none    Author: Richarda Osmond, DO [facility] 04/26/2021, 7:39 AM   Available by Epic secure chat 7AM-7PM. If 7PM-7AM, please contact night-coverage.  [facility] contact information found on [email or web address].

## 2021-04-27 ENCOUNTER — Encounter
Admission: EM | Disposition: A | Discharge status: Home / Self Care | Payer: Self-pay | Source: Home / Self Care | Attending: Student in an Organized Health Care Education/Training Program

## 2021-04-27 ENCOUNTER — Inpatient Hospital Stay: Payer: Medicare Other | Admitting: Registered Nurse

## 2021-04-27 ENCOUNTER — Other Ambulatory Visit: Payer: Self-pay

## 2021-04-27 ENCOUNTER — Encounter: Payer: Self-pay | Admitting: Internal Medicine

## 2021-04-27 DIAGNOSIS — M86071 Acute hematogenous osteomyelitis, right ankle and foot: Secondary | ICD-10-CM

## 2021-04-27 DIAGNOSIS — E662 Morbid (severe) obesity with alveolar hypoventilation: Secondary | ICD-10-CM | POA: Diagnosis not present

## 2021-04-27 DIAGNOSIS — J9611 Chronic respiratory failure with hypoxia: Secondary | ICD-10-CM | POA: Diagnosis not present

## 2021-04-27 DIAGNOSIS — J4550 Severe persistent asthma, uncomplicated: Secondary | ICD-10-CM

## 2021-04-27 DIAGNOSIS — Z8739 Personal history of other diseases of the musculoskeletal system and connective tissue: Secondary | ICD-10-CM

## 2021-04-27 DIAGNOSIS — J449 Chronic obstructive pulmonary disease, unspecified: Secondary | ICD-10-CM | POA: Diagnosis not present

## 2021-04-27 DIAGNOSIS — Z9889 Other specified postprocedural states: Secondary | ICD-10-CM

## 2021-04-27 DIAGNOSIS — R1011 Right upper quadrant pain: Secondary | ICD-10-CM

## 2021-04-27 DIAGNOSIS — K859 Acute pancreatitis without necrosis or infection, unspecified: Secondary | ICD-10-CM | POA: Diagnosis not present

## 2021-04-27 HISTORY — PX: BONE BIOPSY: SHX375

## 2021-04-27 LAB — RENAL FUNCTION PANEL
Albumin: 2.9 g/dL — ABNORMAL LOW (ref 3.5–5.0)
Anion gap: 12 (ref 5–15)
BUN: 46 mg/dL — ABNORMAL HIGH (ref 6–20)
CO2: 26 mmol/L (ref 22–32)
Calcium: 8.8 mg/dL — ABNORMAL LOW (ref 8.9–10.3)
Chloride: 93 mmol/L — ABNORMAL LOW (ref 98–111)
Creatinine, Ser: 7.96 mg/dL — ABNORMAL HIGH (ref 0.44–1.00)
GFR, Estimated: 6 mL/min — ABNORMAL LOW (ref >60)
Glucose, Bld: 236 mg/dL — ABNORMAL HIGH (ref 70–99)
Phosphorus: 5.9 mg/dL — ABNORMAL HIGH (ref 2.5–4.6)
Potassium: 4.4 mmol/L (ref 3.5–5.1)
Sodium: 131 mmol/L — ABNORMAL LOW (ref 135–145)

## 2021-04-27 LAB — GLUCOSE, CAPILLARY
Glucose-Capillary: 228 mg/dL — ABNORMAL HIGH (ref 70–99)
Glucose-Capillary: 193 mg/dL — ABNORMAL HIGH (ref 70–99)
Glucose-Capillary: 130 mg/dL — ABNORMAL HIGH (ref 70–99)
Glucose-Capillary: 121 mg/dL — ABNORMAL HIGH (ref 70–99)

## 2021-04-27 LAB — HEPATITIS B VIRUS (PROFILE VI)

## 2021-04-27 SURGERY — BIOPSY, BONE
Anesthesia: General | Laterality: Right

## 2021-04-27 MED ORDER — MIDAZOLAM HCL 2 MG/2ML IJ SOLN
INTRAMUSCULAR | Status: DC | PRN
  Administered 2021-04-27 (×2): 1 mg via INTRAVENOUS

## 2021-04-27 MED ORDER — LIDOCAINE HCL (PF) 2 % IJ SOLN
INTRAMUSCULAR | Status: AC
  Filled 2021-04-27: qty 5

## 2021-04-27 MED ORDER — FENTANYL CITRATE (PF) 100 MCG/2ML IJ SOLN
INTRAMUSCULAR | Status: AC
Start: 2021-04-27 — End: ?
  Filled 2021-04-27: qty 2

## 2021-04-27 MED ORDER — PHENYLEPHRINE 40 MCG/ML (10ML) SYRINGE FOR IV PUSH (FOR BLOOD PRESSURE SUPPORT)
PREFILLED_SYRINGE | INTRAVENOUS | Status: AC
  Filled 2021-04-27: qty 20

## 2021-04-27 MED ORDER — DEXMEDETOMIDINE (PRECEDEX) IN NS 20 MCG/5ML (4 MCG/ML) IV SYRINGE
PREFILLED_SYRINGE | INTRAVENOUS | Status: DC | PRN
Start: 2021-04-27 — End: 2021-04-27
  Administered 2021-04-27: 20 ug via INTRAVENOUS

## 2021-04-27 MED ORDER — PROPOFOL 500 MG/50ML IV EMUL
INTRAVENOUS | Status: AC
  Filled 2021-04-27: qty 50

## 2021-04-27 MED ORDER — FENTANYL CITRATE (PF) 100 MCG/2ML IJ SOLN: 25.0000 ug | INTRAMUSCULAR | Status: DC | PRN

## 2021-04-27 MED ORDER — LIDOCAINE-EPINEPHRINE 1 %-1:100000 IJ SOLN
INTRAMUSCULAR | Status: AC
  Filled 2021-04-27: qty 1

## 2021-04-27 MED ORDER — MIDAZOLAM HCL 2 MG/2ML IJ SOLN
INTRAMUSCULAR | Status: AC
  Filled 2021-04-27: qty 2

## 2021-04-27 MED ORDER — KETAMINE HCL 10 MG/ML IJ SOLN
INTRAMUSCULAR | Status: DC | PRN
  Administered 2021-04-27: 30 mg via INTRAVENOUS
  Administered 2021-04-27: 20 mg via INTRAVENOUS

## 2021-04-27 MED ORDER — PROPOFOL 500 MG/50ML IV EMUL
INTRAVENOUS | Status: DC | PRN
  Administered 2021-04-27: 40 ug/kg/min via INTRAVENOUS

## 2021-04-27 MED ORDER — EPHEDRINE 5 MG/ML INJ
INTRAVENOUS | Status: AC
  Filled 2021-04-27: qty 5

## 2021-04-27 MED ORDER — BUPIVACAINE HCL (PF) 0.5 % IJ SOLN
INTRAMUSCULAR | Status: AC
  Filled 2021-04-27: qty 30

## 2021-04-27 MED ORDER — SODIUM CHLORIDE 0.9 % IV SOLN: INTRAVENOUS | Status: DC | PRN

## 2021-04-27 MED ORDER — PHENYLEPHRINE 40 MCG/ML (10ML) SYRINGE FOR IV PUSH (FOR BLOOD PRESSURE SUPPORT)
PREFILLED_SYRINGE | INTRAVENOUS | Status: DC | PRN
Start: 2021-04-27 — End: 2021-04-27
  Administered 2021-04-27: 240 ug via INTRAVENOUS
  Administered 2021-04-27: 160 ug via INTRAVENOUS

## 2021-04-27 MED ORDER — SORBITOL 70 % SOLN
960.0000 mL | TOPICAL_OIL | Freq: Once | ORAL | Status: AC
  Administered 2021-04-27: 960 mL via RECTAL
  Filled 2021-04-27: qty 473
  Filled 2021-04-27: qty 240

## 2021-04-27 MED ORDER — LIDOCAINE HCL (PF) 1 % IJ SOLN
INTRAMUSCULAR | Status: AC
  Filled 2021-04-27: qty 30

## 2021-04-27 MED ORDER — KETAMINE HCL 50 MG/5ML IJ SOSY
PREFILLED_SYRINGE | INTRAMUSCULAR | Status: AC
  Filled 2021-04-27: qty 5

## 2021-04-27 MED ORDER — LIDOCAINE HCL (CARDIAC) PF 100 MG/5ML IV SOSY
PREFILLED_SYRINGE | INTRAVENOUS | Status: DC | PRN
  Administered 2021-04-27: 100 mg via INTRAVENOUS

## 2021-04-27 MED ORDER — POVIDONE-IODINE 10 % EX SWAB: Freq: Every day | CUTANEOUS | Status: DC

## 2021-04-27 MED ORDER — KETOROLAC TROMETHAMINE 30 MG/ML IJ SOLN
INTRAMUSCULAR | Status: AC
  Filled 2021-04-27: qty 1

## 2021-04-27 SURGICAL SUPPLY — 42 items
BAG COUNTER SPONGE SURGICOUNT (BAG) IMPLANT
BAG SPNG CNTER NS LX DISP (BAG)
BLADE OSC/SAGITTAL MD 5.5X18 (BLADE) IMPLANT
BLADE SURG 15 STRL LF DISP TIS (BLADE) ×1 IMPLANT
BLADE SURG 15 STRL SS (BLADE) ×2
BLADE SURG MINI STRL (BLADE) ×2 IMPLANT
BNDG COHESIVE 4X5 TAN ST LF (GAUZE/BANDAGES/DRESSINGS) ×2 IMPLANT
BNDG ELASTIC 4X5.8 VLCR STR LF (GAUZE/BANDAGES/DRESSINGS) ×2 IMPLANT
BNDG ESMARK 4X12 TAN STRL LF (GAUZE/BANDAGES/DRESSINGS) ×2 IMPLANT
BNDG GAUZE ELAST 4 BULKY (GAUZE/BANDAGES/DRESSINGS) ×2 IMPLANT
COVER LIGHT HANDLE STERIS (MISCELLANEOUS) ×4 IMPLANT
CUFF TOURN SGL QUICK 18X4 (TOURNIQUET CUFF) ×2 IMPLANT
DRAPE FLUOR MINI C-ARM 54X84 (DRAPES) ×2 IMPLANT
DRSG PAD ABDOMINAL 8X10 ST (GAUZE/BANDAGES/DRESSINGS) ×2 IMPLANT
DURAPREP 26ML APPLICATOR (WOUND CARE) ×2 IMPLANT
ELECT REM PT RETURN 9FT ADLT (ELECTROSURGICAL) ×2
ELECTRODE REM PT RTRN 9FT ADLT (ELECTROSURGICAL) ×1 IMPLANT
GAUZE SPONGE 4X4 12PLY STRL (GAUZE/BANDAGES/DRESSINGS) IMPLANT
GLOVE SURG ENC MOIS LTX SZ7 (GLOVE) ×2 IMPLANT
GLOVE SURG UNDER POLY LF SZ7.5 (GLOVE) ×2 IMPLANT
GOWN STRL REUS W/ TWL LRG LVL3 (GOWN DISPOSABLE) ×2 IMPLANT
GOWN STRL REUS W/ TWL XL LVL3 (GOWN DISPOSABLE) ×1 IMPLANT
GOWN STRL REUS W/TWL LRG LVL3 (GOWN DISPOSABLE) ×4
GOWN STRL REUS W/TWL XL LVL3 (GOWN DISPOSABLE) ×2
HANDPIECE INTERPULSE COAX TIP (DISPOSABLE) ×2
HANDPIECE VERSAJET DEBRIDEMENT (MISCELLANEOUS) IMPLANT
IV NS 1000ML (IV SOLUTION) ×2
IV NS 1000ML BAXH (IV SOLUTION) ×1 IMPLANT
KIT BASIN OR (CUSTOM PROCEDURE TRAY) ×2 IMPLANT
KIT TURNOVER KIT A (KITS) IMPLANT
MANIFOLD NEPTUNE II (INSTRUMENTS) ×2 IMPLANT
NEEDLE HYPO 22GX1.5 SAFETY (NEEDLE) IMPLANT
NS IRRIG 1000ML POUR BTL (IV SOLUTION) ×2 IMPLANT
PACK DRAINAGE HVY KERLIX W/SPG (MISCELLANEOUS) ×2 IMPLANT
PACK EXTREMITY ARMC (MISCELLANEOUS) ×2 IMPLANT
PAD ARMBOARD 7.5X6 YLW CONV (MISCELLANEOUS) ×4 IMPLANT
SET HNDPC FAN SPRY TIP SCT (DISPOSABLE) ×1 IMPLANT
STAPLER SKIN PROX 35W (STAPLE) ×2 IMPLANT
SUT PROLENE 2 0 FS (SUTURE) ×2 IMPLANT
SUT PROLENE 3 0 PS 2 (SUTURE) ×2 IMPLANT
SWAB CULTURE AMIES ANAERIB BLU (MISCELLANEOUS) IMPLANT
SYR CONTROL 10ML LL (SYRINGE) IMPLANT

## 2021-04-27 NOTE — Progress Notes (Signed)
Inpatient Diabetes Program Recommendations ? ?AACE/ADA: New Consensus Statement on Inpatient Glycemic Control (2015) ? ?Target Ranges:  Prepandial:   less than 140 mg/dL ?     Peak postprandial:   less than 180 mg/dL (1-2 hours) ?     Critically ill patients:  140 - 180 mg/dL  ? ? Latest Reference Range & Units 04/26/21 07:25 04/26/21 14:31 04/26/21 17:36 04/26/21 20:31  ?Glucose-Capillary 70 - 99 mg/dL 123 (H) ? ?2 units Novolog ? 224 (H) ? ?5 units Novolog ? 319 (H) ? ?11 units Novolog ? 243 (H) ? ? ? ?18 units Semglee  ?(H): Data is abnormally high ? Latest Reference Range & Units 04/27/21 08:13  ?Glucose-Capillary 70 - 99 mg/dL 228 (H)  ?(H): Data is abnormally high ? Latest Reference Range & Units 04/22/21 01:24  ?Hemoglobin A1C 4.8 - 5.6 % 6.2 (H)  ?(H): Data is abnormally high ? ? ?Admit with:  ?Pancreatitis ?Diabetic foot ulcer  ? ?History: DM2, ESRD, CHF ? ?Home DM Meds: Novolog 0-15 units TID per SSI ?      Tresiba 43 units QHS ? ?Current Orders: Semglee 18 units QHS ?    Novolog Moderate Correction Scale/ SSI (0-15 units) TID AC ? ? ? ?MD- Please consider: ? ?Start low dose Novolog Meal Coverage: Novolog 3 units TID with meals ?HOLD if pt eats <50% meals ? ? ? ?--Will follow patient during hospitalization-- ? ?Wyn Quaker RN, MSN, CDE ?Diabetes Coordinator ?Inpatient Glycemic Control Team ?Team Pager: [phone] (8a-5p) ? ?

## 2021-04-27 NOTE — Progress Notes (Signed)
[name] ? ?MRN: [id number] ? ?DOB/AGE: 06/01/1970 51 y.o. ? ?Primary Care Physician:[name], NP ? ?Admit date: 04/21/2021 ? ?Chief Complaint:  ?Chief Complaint  ?Patient presents with  ? Abdominal Pain  ? Nausea  ? ? ?S-Pt presented on  04/21/2021 with  ?Chief Complaint  ?Patient presents with  ? Abdominal Pain  ? Nausea  ?[name] ?Patient is a 51 year old African-American female with a past medical history of end-stage renal disease, patient is on Tuesday Thursday Saturday schedule, diabetes mellitus, history of pancreatitis who came to the ER with chief complaint of abdominal pain. ? ?Patient seen sitting up in bed ?Tolerating meals without nausea, vomiting, and increased abd pain ?Currently NPO for biopsy ? ?Dialysis yesterday, tolerated well ? ? ?[id number] from the symptoms mentioned above,there are no other symptoms referable to all systems reviewed. ? ?Physical Exam: ?Vital signs in last 24 hours: ?Temp:  [98 ?F (36.7 ?C)-98.2 ?F (36.8 ?C)] 98.1 ?F (36.7 ?C) (03/03 6967) ?Pulse Rate:  [85-100] 88 (03/03 0811) ?Resp:  [18-23] 19 (03/03 8938) ?BP: (111-159)/(44-134) 126/84 (03/03 1017) ?SpO2:  [89 %-97 %] 97 % (03/03 0811) ?Weight:  [115.8 kg] 115.8 kg (03/02 1330) ?Weight change:  ?Last BM Date : 04/21/21 ? ?Intake/Output from previous day: ?03/02 0701 - 03/03 0700 ?In: 0  ?Out: 5102 [Urine:5] ?No intake/output data recorded. ? ? ?Physical Exam: ? ?General- pt is awake,alert, oriented to time place and person ? ?Resp- No acute REsp distress, CTA B/L NO Rhonchi ? ?CVS- S1S2 regular in rate and rhythm ? ?GIT- BS+, soft, minimal tenderness  , Non distended ? ?EXT- trace LE Edema,  No Cyanosis right foot dressing ? ?Access-  LUE AVF ? ?Lab Results: ? ?CBC ? ?Recent Labs  ?  04/25/21 ?0625  ?WBC 6.2  ?HGB 11.8*  ?HCT 37.9  ?PLT 267  ? ? ? ?BMET ? ?Recent Labs  ?  04/26/21 ?0616 04/27/21 ?5852  ?NA 130* 131*  ?K 5.0 4.4  ?CL 90* 93*  ?CO2 24 26  ?GLUCOSE 135* 236*  ?BUN 57* 46*  ?CREATININE 10.04* 7.96*   ?CALCIUM 9.1 8.8*  ? ? ? ? ? ?Most recent Creatinine trend  ?Lab Results  ?Component Value Date  ? CREATININE 7.96 (H) 04/27/2021  ? CREATININE 10.04 (H) 04/26/2021  ? CREATININE 8.34 (H) 04/25/2021  ? ?  ? ? ?MICRO ? ? ?Recent Results (from the past 240 hour(s))  ?Resp Panel by RT-PCR (Flu A&B, Covid) Nasopharyngeal Swab     Status: None  ? Collection Time: 04/21/21 12:16 PM  ? Specimen: Nasopharyngeal Swab; Nasopharyngeal(NP) swabs in vial transport medium  ?Result Value Ref Range Status  ? SARS Coronavirus 2 by RT PCR NEGATIVE NEGATIVE Final  ?  Comment: (NOTE) ?SARS-CoV-2 target nucleic acids are NOT DETECTED. ? ?The SARS-CoV-2 RNA is generally detectable in upper respiratory ?specimens during the acute phase of infection. The lowest ?concentration of SARS-CoV-2 viral copies this assay can detect is ?138 copies/mL. A negative result does not preclude SARS-Cov-2 ?infection and should not be used as the sole basis for treatment or ?other patient management decisions. A negative result may occur with  ?improper specimen collection/handling, submission of specimen other ?than nasopharyngeal swab, presence of viral mutation(s) within the ?areas targeted by this assay, and inadequate number of viral ?copies(<138 copies/mL). A negative result must be combined with ?clinical observations, patient history, and epidemiological ?information. The expected result is Negative. ? ?Fact Sheet for Patients:  ?EntrepreneurPulse.com.au ? ?Fact Sheet for Healthcare Providers:  ?  IncredibleEmployment.be ? ?This test is no t yet approved or cleared by the [place] FDA and  ?has been authorized for detection and/or diagnosis of SARS-CoV-2 by ?FDA under an Emergency Use Authorization (EUA). This EUA will remain  ?in effect (meaning this test can be used) for the duration of the ?COVID-19 declaration under Section 564(b)(1) of the Act, 21 ?U.S.C.section 360bbb-3(b)(1), unless the authorization is  terminated  ?or revoked sooner.  ? ? ?  ? Influenza A by PCR NEGATIVE NEGATIVE Final  ? Influenza B by PCR NEGATIVE NEGATIVE Final  ?  Comment: (NOTE) ?The Xpert Xpress SARS-CoV-2/FLU/RSV plus assay is intended as an aid ?in the diagnosis of influenza from Nasopharyngeal swab specimens and ?should not be used as a sole basis for treatment. Nasal washings and ?aspirates are unacceptable for Xpert Xpress SARS-CoV-2/FLU/RSV ?testing. ? ?Fact Sheet for Patients: ?EntrepreneurPulse.com.au ? ?Fact Sheet for Healthcare Providers: ?IncredibleEmployment.be ? ?This test is not yet approved or cleared by the [place] FDA and ?has been authorized for detection and/or diagnosis of SARS-CoV-2 by ?FDA under an Emergency Use Authorization (EUA). This EUA will remain ?in effect (meaning this test can be used) for the duration of the ?COVID-19 declaration under Section 564(b)(1) of the Act, 21 U.S.C. ?section 360bbb-3(b)(1), unless the authorization is terminated or ?revoked. ? ?Performed at [facility], [place], ?[place] [address] ?  ?MRSA Next Gen by PCR, Nasal     Status: None  ? Collection Time: 04/23/21  6:55 AM  ? Specimen: Nasal Mucosa; Nasal Swab  ?Result Value Ref Range Status  ? MRSA by PCR Next Gen NOT DETECTED NOT DETECTED Final  ?  Comment: (NOTE) ?The GeneXpert MRSA Assay (FDA approved for NASAL specimens only), ?is one component of a comprehensive MRSA colonization surveillance ?program. It is not intended to diagnose MRSA infection nor to guide ?or monitor treatment for MRSA infections. ?Test performance is not FDA approved in patients less than 2 years ?old. ?Performed at [facility], [place], ?[place] [address] ?  ?  ? ?CT abdomen done on April 21, 2021. ?IMPRESSION: ?Enlarged pancreatic head with adjacent peripancreatic soft tissue ?stranding. This is suspicious for focal pancreatitis, although ?pancreatic carcinoma  cannot definitely be excluded. Recommend ?correlation with serum lipase level, and continued follow-up by ?pancreatic protocol MRI or CT without and with contrast. ?  ?Right-sided pleural thickening and rounded opacity in posterior ?right lower lobe, new since 2016 exam. Rounded atelectasis is ?favored, however pulmonary infiltrate or neoplasm cannot definitely ?be excluded. Recommend clinical correlation, and short-term ?follow-up with chest CT or PET-CT. ? ? ?Impression: ? ? ? ?Patient is well-known to our practice as patient undergoes dialysis at [facility] dialysis under care of Dr. Murlean Iba ?Patient is on Tuesday Thursday Saturday schedule ?Patient has a target dry weight of 119.5 kg ? ?1)  End-stage renal disease on dialysis. ? Dialysis received yesterday, UF goal 1.78L achieved. Next treatment scheduled for Saturday ? ?2)HTN with chronic kidney disease ? Currently only receiving midodrine 20 mg as needed for low blood pressure.  ? BP stable for this patient ? ?3)Anemia of chronic disease ? ?CBC Latest Ref Rng & Units 04/25/2021 04/23/2021 04/22/2021  ?WBC 4.0 - 10.5 K/uL 6.2 6.6 8.9  ?Hemoglobin 12.0 - 15.0 g/dL 11.8(L) 13.5 12.2  ?Hematocrit 36.0 - 46.0 % 37.9 44.1 40.4  ?Platelets 150 - 400 K/uL 267 295 301  ?  ?Patient did receive Mircera as outpatient ?Hemoglobin stable ? ?4)  Secondary hyperparathyroidism -CKD Mineral-Bone Disorder/Renal failure associated hyperphosphatemia  ? ?Lab Results  ?Component Value Date  ? PTH 134 (H) 04/21/2017  ? CALCIUM 8.8 (L) 04/27/2021  ? CAION 0.81 (LL) 10/26/2020  ? PHOS 5.9 (H) 04/27/2021  ? ?Binders held due to pancreatitis.  ? ?5)Pancreatitis ? Tolerating advanced diet. Continue supportive care ? ? ? ?[place] ?04/27/2021, 12:57 PM  ?

## 2021-04-27 NOTE — Op Note (Signed)
Surgeon: Surgeon(s): ?[name], DPM  ?Assistants: None ?Pre-operative diagnosis: osteomyelitis  ?Post-operative diagnosis: same ?Procedure: Procedure(s) (LRB): ?Right 4th metatarsal BONE BIOPSY (Right)  ?Pathology: * No specimens in log *  ?Pertinent Intra-op findings: Right fourth metatarsal bone biopsy/proximal phalanx bone biopsy ?Anesthesia: Choice  ?Hemostasis: None ?EBL: None ?Materials: None ?Injectables: None ?Complications: None ? ?Indications for surgery: ?A 52 y.o. female presents with right foot gangrene with underlying fourth metatarsal osteomyelitis. Patient has failed all conservative therapy including but not limited to limited to local wound care and IV antibiotics. She wishes to have surgical correction of the foot/deformity. It was determined that patient would benefit from right fourth metatarsal bone biopsy for guidance of infectious disease and antibiotics tailored. ?Informed surgical risk consent was reviewed and read aloud to the patient.  I reviewed the films.  I have discussed my findings with the patient in great detail.  I have discussed all risks including but not limited to infection, stiffness, scarring, limp, disability, deformity, damage to blood vessels and nerves, numbness, poor healing, need for braces, arthritis, chronic pain, amputation, death.  All benefits and realistic expectations discussed in great detail.  I have made no promises as to the outcome.  I have provided realistic expectations.  I have offered the patient a 2nd opinion, which they have declined and assured me they preferred to proceed despite the risks ? ? ?Procedure in detail: ?The patient was both verbally and visually identified by myself, the nursing staff, and anesthesia staff in the preoperative holding area. They were then transferred to the operating room and placed on the operative table in supine position. ? ?Attention was directed to the lateral foot, normal saline solution was used to irrigate  the area thoroughly.  Using Jamshidi/rongeur fourth metatarsal bone biopsy was obtained in standard sterile technique.  This specimen was sent to microbiology and pathology. ? ?A separate fourth proximal phalanx base biopsy was obtained and sent technique to microbiology and pathology.  The wound was again thoroughly irrigated.  Betadine wet-to-dry dressing was applied Kerlix was applied Ace bandage was applied all bony prominences were adequately padded. ? ?Disposition ?-Patient is okay to be discharged from our standpoint.  Infectious disease for antibiotic recommendation based on culturing.  Patient can follow-up with the wound care center 1 week from the discharge.  Once again patient will benefit from below the knee amputation of the right side as patient has extensive gangrene. ? ?At the conclusion of the procedure the patient was awoken from anesthesia and found to have tolerated the procedure well any complications. There were transferred to PACU with vital signs stable and vascular status intact. ? ?Boneta Lucks, DPM ? ?

## 2021-04-27 NOTE — Anesthesia Preprocedure Evaluation (Signed)
Anesthesia Evaluation  ?Patient identified by MRN, date of birth, ID band ?Patient awake ? ?General Assessment Comment:Patient appears drowsy, says she took xanax recently. ?Appears anxious about procedure and anesthetic. ? ?Reviewed: ?Allergy & Precautions, NPO status , [name] Chart, lab work & pertinent test results ? ?History of Anesthesia Complications ?Negative for: history of anesthetic complications ? ?Airway ?Mallampati: II ? ?TM Distance: >3 FB ?Neck ROM: Full ? ? ? Dental ?no notable dental hx. ?(+) Teeth Intact, Dental Advidsory Given ?  ?Pulmonary ?shortness of breath and with exertion, asthma , neg sleep apnea, COPD,  oxygen dependent, Recent URI , Residual Cough, Patient abstained from smoking.Not current smoker,  ?On 3-4L/min O2 ?Denies OSA ?  ? ?+ decreased breath sounds ? ? ? ? ? Cardiovascular ?Exercise Tolerance: Poor ?METShypertension, Pt. on medications ?(-) angina+ Peripheral Vascular Disease, +CHF and + Orthopnea  ?(-) CAD and (-) Past MI (-) dysrhythmias  ?Rhythm:Regular Rate:Normal ?- Systolic murmurs ?TTE 2018: ?Study Conclusions  ? ?- Left ventricle: The cavity size was mildly dilated. Systolic  ???function was normal. The estimated ejection fraction was 55%.  ???Wall motion was normal; there were no regional wall motion  ???abnormalities. Doppler parameters are consistent with abnormal  ???left ventricular relaxation (grade 1 diastolic dysfunction).  ?- Mitral valve: There was mild regurgitation.  ?- Atrial septum: No defect or patent foramen ovale was identified.  ?  ?Neuro/Psych ?PSYCHIATRIC DISORDERS Anxiety Depression negative neurological ROS ?   ? GI/Hepatic ?neg GERD  ,(+)  ?  ? (-) substance abuse ? ,   ?Endo/Other  ?diabetes, Poorly Controlled, Insulin DependentMorbid obesity ? Renal/GU ?ESRF and DialysisRenal diseaseLast dialyzed this morning  ? ?  ?Musculoskeletal ? ? Abdominal ?(+) + obese,   ?Peds ? Hematology ?  ?Anesthesia Other  Findings ?Past Medical History: ?No date: (HFpEF) heart failure with preserved ejection fraction ([place]) ?    Comment:  a. 11/2014 Echo: EF 50-55%, Gr1 DD; b. 06/2016 Echo: EF  ?             60-65%, no rwma, mod dil LA, nl RV, triv eff. ?No date: Anxiety ?No date: Asthma ?No date: Bronchitis ?No date: CHF (congestive heart failure) ([place]) ?No date: Chronic cough ?No date: Chronic respiratory failure with hypoxia and hypercapnia  ?(HCC) ?    Comment:  a. Home O2.  (2-4L) ?No date: CKD (chronic kidney disease), stage III ([place]) ?No date: Depression ?No date: Diabetes mellitus without complication ([place]) ?No date: Diverticulosis ?No date: Dyspnea ?No date: Dysrhythmia ?    Comment:  per pt, no arrythmias ?No date: Environmental and seasonal allergies ?No date: Extreme obesity with alveolar hypoventilation (HCC) ?No date: Hypertension ?No date: Iron deficiency anemia ?    Comment:  a. chronic blood loss->heavy menses. ?No date: Kidney failure ?    Comment:  dialysis tues/thur/sat   ... left arm shunt ?No date: Orthopnea ?No date: Oxygen dependent ?    Comment:  3l/min ?No date: Pericardial effusion ?    Comment:  a. 11/2014 CT Chest: Mod effusion; b. 11/2014 Echo:  ?             small to mod circumferential effusion. No hemodynamic  ?             compromise. ?No date: Pneumonia ?    Comment:  history ?No date: Poorly controlled diabetes mellitus ([place]) ?    Comment:  a. 11/2014 A1c 13.2. ?No date: Swelling of both lower extremities ? Reproductive/Obstetrics ? ?  ? ? ? ? ? ? ? ? ? ? ? ? ? ?  ?  ? ? ? ? ? ? ? ? ?  Anesthesia Physical ? ?Anesthesia Plan ? ?ASA: 4 ? ?Anesthesia Plan: General  ? ?Post-op Pain Management:   ? ?Induction: Intravenous ? ?PONV Risk Score and Plan: 3 and Ondansetron, Propofol infusion, TIVA, Midazolam and Dexamethasone ? ?Airway Management Planned: Natural Airway and Simple Face Mask ? ?Additional Equipment: None ? ?Intra-op Plan:  ? ?Post-operative Plan:  ? ?Informed Consent: I have reviewed the patients  History and Physical, chart, labs and discussed the procedure including the risks, benefits and alternatives for the proposed anesthesia with the patient or authorized representative who has indicated his/her understanding and acceptance.  ? ? ? ?Dental advisory given ? ?Plan Discussed with: CRNA and Surgeon ? ?Anesthesia Plan Comments: (Discussed risks of anesthesia with patient, including possibility of difficulty with spontaneous ventilation under anesthesia necessitating airway intervention, PONV, and rare risks such as cardiac or respiratory or neurological events, and allergic reactions. Patient understands. ?Patient counseled on being higher risk for anesthesia due to comorbidities: ESRD, morbid obesity. Patient was told about increased risk of cardiac and respiratory events, including death. Patient understands. ?)  ? ? ? ? ? ? ?Anesthesia Quick Evaluation ? ?

## 2021-04-27 NOTE — Transfer of Care (Signed)
Immediate Anesthesia Transfer of Care Note ? ?Patient: [name] ? ?Procedure(s) Performed: Right 4th metatarsal BONE BIOPSY (Right) ? ?Patient Location: PACU ? ?Anesthesia Type:General ? ?Level of Consciousness: drowsy and patient cooperative ? ?Airway & Oxygen Therapy: Patient Spontanous Breathing and Patient connected to nasal cannula oxygen ? ?Post-op Assessment: Report given to RN and Post -op Vital signs reviewed and stable ? ?Post vital signs: Reviewed and stable ? ?Last Vitals:  ?Vitals Value Taken Time  ?BP 147/70 04/27/21 1539  ?Temp 36.5 ?C 04/27/21 1539  ?Pulse 86 04/27/21 1543  ?Resp 15 04/27/21 1543  ?SpO2 97 % 04/27/21 1543  ?Vitals shown include unvalidated device data. ? ?Last Pain:  ?Vitals:  ? 04/27/21 1539  ?TempSrc:   ?PainSc: 0-No pain  ?   ? ?Patients Stated Pain Goal: 0 (04/25/21 1833) ? ?Complications: No notable events documented. ?

## 2021-04-27 NOTE — Progress Notes (Signed)
?PROGRESS NOTE ? ?[name]    DOB: 11-Jun-1970, 51 y.o.  ?JEH:[id number]  ?  Code Status: Full Code   ?DOA: 04/21/2021   LOS: 6  ? ?Brief hospital course  ?[name] is a 51 y.o. female with a PMH significant for HTN, COPD on 3 L nasal cannula chronically, ESRD on HD TTS, type II DM, known osteomyelitis of right foot s/p fifth ray amputation with poorly healing ulcers. ?They presented from home to the ED on 04/21/2021 with epigastric pain, nausea, vomiting x 4 days.  At the same time, she also had increased sputum production with cough so was started on azithromycin but unable to tolerate the full treatment. ?In the ED, it was found that they had pancreatitis and likely COPD exacerbation.  ?They were treated with analgesia, bowel rest, IV fluids.  GI was consulted. ?Imaging suggested osteomyelitis of right foot. ?Additionally, she was mildly hyponatremic and hyperkalemic due to missing several dialysis sessions. ?Patient was admitted to medicine service for further workup and management of pancreatitis, osteomyelitis, electrolyte abnormalities as outlined in detail below. ? ?04/27/21 -stable, improved ? ?Assessment & Plan  ?Principal Problem: ?  Pancreatitis ?Active Problems: ?  Obesity, Class III, BMI 40-49.9 (morbid obesity) ([place]) ?  Type 2 diabetes mellitus with ESRD (end-stage renal disease) ([place]) ?  COPD with chronic bronchitis ([place]) ?  ESRD (end stage renal disease) ([place]) ?  Chronic respiratory failure with hypoxia (HCC) ?  Diabetic foot ulcer ([place]) ?  Hyponatremia ?  Metabolic acidosis, increased anion gap ?  GERD (gastroesophageal reflux disease) ?  Acute hematogenous osteomyelitis of right foot ([place]) ? ?Pancreatitis- improving. Patient still endorsing epigastric pain but is tolerating soft diet well. Repeat CT abdomen 2/28 showed normal progression of pancreatitis without necrosis. Suspect her pain is more related to her constipation at this time. ?- GI signed off ?- advance diet as tolerated ?-  continue analgesia PRN and taper off as tolerated  ? ?Hyponatremia-  stable- Na+ 130>131 ?- management concurrently with HD ?  ?Diabetic foot ulcer  osteomyelitis- previously diagnosed with gangrene and associated osteomyelitis requiring fifth ray amputation of the foot 09/2020. ?- xray suggestive of osteo> MRI confirmed osteomyelitis ?- patient refuses further amputation. Consulted ID for recommendations in guiding Abx course. She will receive IV Abx at HD for duration of therapy ?- podiatry consulted for biopsy to guide Abx course. Holding off on Abx until Bx taken ? - bx 3/3 ?- wound care ?  ?Chronic respiratory failure with hypoxia ([place])- chronic, stable ?Chronic respiratory failure on 3 L nasal cannula oxygen 24/7 with O2 saturations currently maintained.  Patient had recently had a sleep study done in the outpatient setting, but has not heard back regarding results.  CT imaging did note concern for right-sided pleural effusion. -Patient is currently at baseline O2 requirements at 3-4L by  to maintain saturation of 92% ?-Fluid management with dialysis ?-Follow-up with pulmonology in outpatient setting regarding sleep study ?- bronchodilators PRN ?- complete azithromycin course ?  ?ESRD on HD TThS  hyperkalemia (resolved)-  ?- nephrology managing, appreciate recs ?  ?Type 2 diabetes mellitus with ESRD (end-stage renal disease) ([place])- will impact wound healing ?- sSSI ?- semglee daily ?  ?GERD ? ?Body mass index is 43.82 kg/m?. ? ?VTE ppx: heparin injection 5,000 Units Start: 04/22/21 0045 ? ? ?Diet:  ?   ?Diet  ? Diet NPO time specified  ? ?Subjective 04/27/21   ? ?Pt reports doing well overall. She feels very  hungry as she is NPO for her biopsy currently.  ?  ?Objective  ? ?Vitals:  ? 04/26/21 1527 04/26/21 2002 04/27/21 0539 04/27/21 0723  ?BP: 127/78 (!) 114/44 116/63   ?Pulse: 94 100 91 94  ?Resp: 18 19 19 18   ?Temp: 98 ?F (36.7 ?C) 98 ?F (36.7 ?C) 98.1 ?F (36.7 ?C)   ?TempSrc:      ?SpO2: 93% 94% 93% 91%   ?Weight:      ?Height:      ? ? ?Intake/Output Summary (Last 24 hours) at 04/27/2021 0752 ?Last data filed at 04/26/2021 1956 ?Gross per 24 hour  ?Intake 0 ml  ?Output 1792 ml  ?Net -1792 ml  ? ? ?Filed Weights  ? 04/24/21 1329 04/26/21 1017 04/26/21 1330  ?Weight: 119.2 kg 118 kg 115.8 kg  ?  ? ?Physical Exam:  ?General: awake, alert, NAD ?HEENT: atraumatic, clear conjunctiva, anicteric sclera, MMM, hearing grossly normal ?Respiratory: normal respiratory effort. ?Gastrointestinal: soft, tender to epigastric area ?Nervous: A&O x3. no gross focal neurologic deficits, normal speech ?Extremities: R foot with packed ulcers on plantar and dorsal surfaces. Poor perfusion. Poor sensation.  ?Skin: dry, intact, normal temperature. Lesions on face ?Psychiatry: normal mood, congruent affect ? ?Labs   ?I have personally reviewed the following labs and imaging studies ?CBC ?   ?Component Value Date/Time  ? WBC 6.2 04/25/2021 0625  ? RBC 4.15 04/25/2021 0625  ? HGB 11.8 (L) 04/25/2021 7673  ? HGB 12.4 01/11/2014 1324  ? HCT 37.9 04/25/2021 0625  ? HCT 39.2 01/11/2014 1324  ? PLT 267 04/25/2021 0625  ? PLT 370 01/11/2014 1324  ? MCV 91.3 04/25/2021 0625  ? MCV 86 01/11/2014 1324  ? MCH 28.4 04/25/2021 0625  ? MCHC 31.1 04/25/2021 0625  ? RDW 14.9 04/25/2021 0625  ? RDW 15.1 (H) 01/11/2014 1324  ? LYMPHSABS 0.9 04/25/2021 0625  ? LYMPHSABS 1.8 01/11/2014 1324  ? MONOABS 0.6 04/25/2021 0625  ? MONOABS 0.5 01/11/2014 1324  ? EOSABS 0.2 04/25/2021 0625  ? EOSABS 0.2 01/11/2014 1324  ? BASOSABS 0.0 04/25/2021 0625  ? BASOSABS 0.1 01/11/2014 1324  ? ?BMP Latest Ref Rng & Units 04/27/2021 04/26/2021 04/25/2021  ?Glucose 70 - 99 mg/dL 236(H) 135(H) 130(H)  ?BUN 6 - 20 mg/dL 46(H) 57(H) 39(H)  ?Creatinine 0.44 - 1.00 mg/dL 7.96(H) 10.04(H) 8.34(H)  ?Sodium 135 - 145 mmol/L 131(L) 130(L) 130(L)  ?Potassium 3.5 - 5.1 mmol/L 4.4 5.0 4.9  ?Chloride 98 - 111 mmol/L 93(L) 90(L) 89(L)  ?CO2 22 - 32 mmol/L 26 24 25   ?Calcium 8.9 - 10.3 mg/dL 8.8(L) 9.1  8.8(L)  ? ? ?No results found. ? ?Disposition Plan & Communication  ?Patient status: Inpatient  ?Admitted From: Home ?Planned disposition location: Home ?Anticipated discharge date: 3/7 pending biopsy and antibiotic choice ? ?Family Communication: none  ?  ?Author: ?Richarda Osmond, DO ?[facility] ?04/27/2021, 7:52 AM  ? ?Available by Epic secure chat 7AM-7PM. ?If 7PM-7AM, please contact night-coverage.  ?[facility] contact information found on [email or web address] ? ?

## 2021-04-27 NOTE — Consult Note (Signed)
NAME:  [name], MRN:  [id number], DOB:  01/20/1971, LOS: 6 ADMISSION DATE:  04/21/2021, CONSULTATION DATE: 04/27/2021 REFERRING MD: Doristine Mango, MD, CHIEF COMPLAINT: Sleep disordered breathing  History of Present Illness:  Patient is a 51 year old lifelong never smoker, with end-stage renal disease, obesity with obesity with alveolar hypoventilation syndrome and severe persistent asthma with asthmatic bronchitis who presented to [facility] on 25 February due to pancreatitis.  We were asked to evaluate the patient because she had "not heard about her sleep study".  The patient had a sleep study in the earlier parts of February and it is well-documented in the chart that we discussed with the patient that she has SEVERE sleep apnea and would need an in lab titration study.  The patient however, has declined the in lab titration study and we have been trying to obtain auto BiPAP for her with other parameters.  She however has not had these outpatient studies done.  We have been asked to try to facilitate getting the necessary equipment for the patient.  The patient has not had any recent change in her baseline shortness of breath.  She has been coughing up yellowish sputum.  She is currently to undergo bone biopsy of her gangrenous foot to determine antibiotic coverage.  She has not had any chest pain, no hemoptysis.  She has been compliant with dialysis.  She has chronic hypercarbic and hypoxic respiratory failure due to extreme obesity with alveolar hypoventilation.  She wears at baseline 3 L/min oxygen supplementation and 4 L/min during dialysis.  Except for abdominal pain issues due to pancreatitis she does not endorse any other symptomatology today.  Pertinent  Medical History   Patient Active Problem List   Diagnosis Date Noted   Acute hematogenous osteomyelitis of right foot ([place])    Pancreatitis 81/19/1478   Metabolic acidosis, increased anion gap 04/21/2021   GERD (gastroesophageal  reflux disease) 04/21/2021   Acute on chronic diastolic CHF (congestive heart failure) ([place]) 11/14/2020   Anemia of chronic disease    Hyponatremia    Gangrene (HCC)    PVD (peripheral vascular disease) (HCC)    Aspiration into airway    Hearing loss of right ear    Diabetic foot ulcer ([place]) 10/15/2020   Decreased pedal pulses 10/15/2020   Chronic respiratory failure with hypoxia ([place])    Extreme obesity with alveolar hypoventilation ([place])    Palliative care by specialist    Goals of care, counseling/discussion    ESRD (end stage renal disease) ([place])    Adjustment disorder with mixed anxiety and depressed mood 03/31/2017   Dysthymia 03/31/2017   Chronic pain syndrome 03/31/2017   Palliative care encounter    Obesity hypoventilation syndrome ([place]) 03/09/2017   Chronic diastolic heart failure ([place]) 01/07/2017   Depression 09/24/2016   Acute on chronic diastolic heart failure ([place]) 07/04/2016   COPD with chronic bronchitis ([place]) 07/04/2016   Acute on chronic respiratory failure ([place]) 06/26/2016   CKD (chronic kidney disease), stage III ([place]) 06/26/2016   COPD exacerbation ([place]) 03/25/2016   Anxiety 10/02/2015   Asthma 08/30/2015   Type 2 diabetes mellitus with ESRD (end-stage renal disease) ([place]) 03/13/2015   Iron deficiency anemia 02/17/2015   Obesity, Class III, BMI 40-49.9 (morbid obesity) ([place])    Shortness of breath    Essential hypertension    Hypertensive heart disease with congestive heart failure ([place]) 12/18/2014   Diabetes mellitus with ESRD (end-stage renal disease) ([place]) 12/15/2014   Significant Hospital Events: Including procedures,  antibiotic start and stop dates in addition to other pertinent events   04/27/2021 right fourth metatarsal bone biopsy  Interim History / Subjective:  Had overnight oximetry last night.  Does not seem to have good insight as to her issues.  Objective   Blood pressure 126/84, pulse 88, temperature 98.1 F (36.7 C), temperature source  Oral, resp. rate 19, height 5\' 4"  (1.626 m), weight 115.8 kg, last menstrual period 09/06/2016, SpO2 97 %.    Intake/Output Summary (Last 24 hours) at 04/27/2021 1053 Last data filed at 04/26/2021 1956 Gross per 24 hour  Intake 0 ml  Output 1792 ml  Net -1792 ml   Filed Weights   04/24/21 1329 04/26/21 1017 04/26/21 1330  Weight: 119.2 kg 118 kg 115.8 kg   Examination: GENERAL: Obese woman in no acute respiratory distress.  She is sitting in bed without distress.  She is wearing oxygen via nasal cannula at 3 L/min.  HEAD: Normocephalic, atraumatic.  EYES: Pupils equal, round, reactive to light.  No scleral icterus.  MOUTH: Oral mucosa moist no thrush. NECK: Thick, circumference 19 inches, supple. No thyromegaly. Trachea midline. No JVD.  No adenopathy. PULMONARY: Symmetrical air entry.  Coarse breath sounds but no wheezes noted.  Moving air fairly well. CARDIOVASCULAR: S1 and S2. Regular rate and rhythm.  No rubs or gallops heard.  Grade 2/6 systolic ejection murmur radiating to axilla. GASTROINTESTINAL: Obese abdomen, nondistended. MUSCULOSKELETAL: AV fistula left upper extremity with good thrill.  She has a gangrenous right foot,it is wrapped. This was not exposed for examination. NEUROLOGIC: Awake, alert, no overt focal deficits.  Speech is fluent.  Gait not assessed. SKIN: Intact,warm,dry.  No overt rashes noted. PSYCH: Mood and behavior appropriate.  Insight: Poor  Overnight oximetry 3/2 - 3/3: On 3 L/min O2, patient still has episodes of extreme oxygen desaturations as low as 69%.  Resolved Hospital Problem list   N/A  Assessment & Plan:  SEVERE obstructive sleep apnea RDI 42/h Obesity with alveolar hypoventilation, PaCO2 50 [name] DME is [facility] SHE WILL NOT QUALIFY FOR TRILOGY VENTILATOR BY HER CURRENT INSURANCE We will order auto BiPAP since patient refuses titration study, orders placed to [facility] Patient will need 2 L/min nasal cannula O2 bled into BiPAP Follow-up at  [facility] in 4 to 6 weeks  Severe persistent asthma, no exacerbation currently Asthmatic bronchitis Continue nebulized medications She appears well compensated  Chronic hypoxic and hypercarbic respiratory failure Due to extreme obesity Weight loss is recommended Continue oxygen supplementation   Labs   CBC: Recent Labs  Lab 04/21/21 0853 04/22/21 0354 04/23/21 0653 04/25/21 0625  WBC 9.3 8.9 6.6 6.2  NEUTROABS  --   --  4.8 4.5  HGB 13.5 12.2 13.5 11.8*  HCT 44.5 40.4 44.1 37.9  MCV 91.6 93.7 92.6 91.3  PLT 294 301 295 510    Basic Metabolic Panel: Recent Labs  Lab 04/23/21 0515 04/24/21 0429 04/25/21 0625 04/26/21 0616 04/27/21 0535  NA 130* 128* 130* 130* 131*  K 5.5* 5.6* 4.9 5.0 4.4  CL 90* 91* 89* 90* 93*  CO2 22 23 25 24 26   GLUCOSE 113* 117* 130* 135* 236*  BUN 55* 62* 39* 57* 46*  CREATININE 10.20* 11.56* 8.34* 10.04* 7.96*  CALCIUM 8.8* 8.5* 8.8* 9.1 8.8*  PHOS 9.2* 10.4* 8.3* 8.2* 5.9*   GFR: Estimated Creatinine Clearance: 10.7 mL/min (A) (by C-G formula based on SCr of 7.96 mg/dL (H)). Recent Labs  Lab 04/21/21 [phone] 04/22/21 0354 04/23/21 2778 04/25/21  3790  WBC 9.3 8.9 6.6 6.2    Liver Function Tests: Recent Labs  Lab 04/21/21 0853 04/22/21 0354 04/23/21 0515 04/24/21 0429 04/25/21 0625 04/26/21 0616 04/27/21 0535  AST 20  --   --   --   --   --   --   ALT 19  --   --   --   --   --   --   ALKPHOS 357*  --   --   --   --   --   --   BILITOT 0.6  --   --   --   --   --   --   PROT 8.9*  --   --   --   --   --   --   ALBUMIN 4.3   < > 3.5 3.5 3.6 3.7 2.9*   < > = values in this interval not displayed.   Recent Labs  Lab 04/21/21 0853 04/25/21 0625  LIPASE 439* 118*   No results for input(s): AMMONIA in the last 168 hours.  ABG    Component Value Date/Time   PHART 7.38 04/26/2021 1617   PCO2ART 50 (H) 04/26/2021 1617   PO2ART 69 (L) 04/26/2021 1617   HCO3 29.6 (H) 04/26/2021 1617   TCO2 23  10/26/2020 1619   O2SAT 95.3 04/26/2021 1617     Coagulation Profile: No results for input(s): INR, PROTIME in the last 168 hours.  Cardiac Enzymes: No results for input(s): CKTOTAL, CKMB, CKMBINDEX, TROPONINI in the last 168 hours.  HbA1C: Hgb A1c MFr Bld  Date/Time Value Ref Range Status  04/22/2021 01:24 AM 6.2 (H) 4.8 - 5.6 % Final    Comment:    (NOTE) Pre diabetes:          5.7%-6.4%  Diabetes:              >6.4%  Glycemic control for   <7.0% adults with diabetes   10/15/2020 12:33 AM 8.9 (H) 4.8 - 5.6 % Final    Comment:    (NOTE) Pre diabetes:          5.7%-6.4%  Diabetes:              >6.4%  Glycemic control for   <7.0% adults with diabetes     CBG: Recent Labs  Lab 04/26/21 0725 04/26/21 1431 04/26/21 1736 04/26/21 2031 04/27/21 0813  GLUCAP 123* 224* 319* 243* 228*    Review of Systems:   A 10 point review of systems was performed and it is as noted above otherwise negative.  Past Medical History:   Past Medical History:  Diagnosis Date   (HFpEF) heart failure with preserved ejection fraction ([place])    a. 11/2014 Echo: EF 50-55%, Gr1 DD; b. 06/2016 Echo: EF 60-65%, no rwma, mod dil LA, nl RV, triv eff.   Anxiety    Asthma    Bronchitis    CHF (congestive heart failure) (HCC)    Chronic cough    Chronic respiratory failure with hypoxia and hypercapnia ([place])    a. Home O2.  (2-4L)   CKD (chronic kidney disease), stage III (HCC)    Depression    Diabetes mellitus without complication (HCC)    Diverticulosis    Dyspnea    Dysrhythmia    per pt, no arrythmias   Environmental and seasonal allergies    Extreme obesity with alveolar hypoventilation (HCC)    Hypertension    Iron deficiency anemia  a. chronic blood loss->heavy menses.   Kidney failure    dialysis tues/thur/sat   ... left arm shunt   Orthopnea    Oxygen dependent    3l/min   Pericardial effusion    a. 11/2014 CT Chest: Mod effusion; b. 11/2014 Echo: small to mod  circumferential effusion. No hemodynamic compromise.   Pneumonia    history   Poorly controlled diabetes mellitus ([place])    a. 11/2014 A1c 13.2.   Swelling of both lower extremities      Surgical History:   Past Surgical History:  Procedure Laterality Date   A/V FISTULAGRAM Left 06/09/2017   Procedure: A/V FISTULAGRAM;  Surgeon: Algernon Huxley, MD;  Location: [facility];  Service: Cardiovascular;  Laterality: Left;   A/V FISTULAGRAM Left 06/16/2017   Procedure: A/V FISTULAGRAM;  Surgeon: Algernon Huxley, MD;  Location: [facility];  Service: Cardiovascular;  Laterality: Left;   A/V FISTULAGRAM Left 01/28/2018   Procedure: A/V FISTULAGRAM;  Surgeon: Algernon Huxley, MD;  Location: [facility];  Service: Cardiovascular;  Laterality: Left;   AMPUTATION TOE Right 10/21/2020   Procedure: AMPUTATION TOE-5th Digit Amputation Right Foot;  Surgeon: Edrick Kins, DPM;  Location: [facility];  Service: Podiatry;  Laterality: Right;   AV FISTULA PLACEMENT Left 04/25/2017   Procedure: ARTERIOVENOUS (AV) FISTULA CREATION;  Surgeon: Algernon Huxley, MD;  Location: [facility];  Service: Vascular;  Laterality: Left;   CATARACT EXTRACTION W/PHACO Left 01/21/2018   Procedure: CATARACT EXTRACTION PHACO AND INTRAOCULAR LENS PLACEMENT (IOC);  Surgeon: Leandrew Koyanagi, MD;  Location: [facility];  Service: Ophthalmology;  Laterality: Left;  [place] 01:55.5 CDE 9.05 EAUP 13.9 Fluid Pack Lot # [id number]   CESAREAN SECTION     times 3   DIALYSIS/PERMA CATHETER INSERTION Right 04/04/2017   Procedure: DIALYSIS/PERMA CATHETER INSERTION;  Surgeon: Katha Cabal, MD;  Location: [facility];  Service: Cardiovascular;  Laterality: Right;   DIALYSIS/PERMA CATHETER REMOVAL N/A 09/17/2017   Procedure: DIALYSIS/PERMA CATHETER REMOVAL;  Surgeon: Algernon Huxley, MD;  Location: [facility];  Service: Cardiovascular;  Laterality: N/A;   INCISION AND DRAINAGE Right 10/26/2020   Procedure: INCISION AND  DRAINAGE;  Surgeon: [name], DPM;  Location: [facility];  Service: Podiatry;  Laterality: Right;   INSERTION OF DIALYSIS CATHETER N/A 04/16/2017   Procedure: INSERTION OF DIALYSIS PERM CATHETER;  Surgeon: Algernon Huxley, MD;  Location: [facility];  Service: Vascular;  Laterality: N/A;   LOWER EXTREMITY ANGIOGRAPHY Right 10/18/2020   Procedure: Lower Extremity Angiography;  Surgeon: Algernon Huxley, MD;  Location: [facility];  Service: Cardiovascular;  Laterality: Right;     Social History:   Social History   Tobacco Use   Smoking status: Never   Smokeless tobacco: Never  Substance Use Topics   Alcohol use: Not Currently    Comment: occasional  Does use marijuana occasionally   Family History:   Family History  Problem Relation Age of Onset   Diabetes Mother    Hypertension Mother    Hypertension Father    Diabetes Father    Hypertension Other    Diabetes Other    Breast cancer Maternal Aunt 52   Breast cancer Maternal Aunt 50   Allergies Allergies  Allergen Reactions   Dust Mite Extract Shortness Of Breath and Itching   Mold Extract [Trichophyton] Shortness Of Breath and Itching   Pollen Extract Shortness Of Breath    Itching, shortness  of breath, asthma like symptoms   Sulfa Antibiotics Itching   Feraheme [Ferumoxytol] Itching    Uses benadryl so she can get the medicine     Home Medications  Prior to Admission medications   Medication Sig Start Date End Date Taking? Authorizing Provider  acetaminophen (TYLENOL) 500 MG tablet Take 1,000 mg by mouth every 6 (six) hours as needed for mild pain, fever or headache.   Yes [provider]  albuterol (PROVENTIL) (2.5 MG/3ML) 0.083% nebulizer solution Take 3 mLs (2.5 mg total) by nebulization every 6 (six) hours as needed for wheezing or shortness of breath. 02/28/21  Yes Tyler Pita, MD  albuterol (VENTOLIN HFA) 108 (90 Base) MCG/ACT inhaler INHALE 2 PUFFS BY MOUTH EVERY 4 HOURS AS NEEDED FOR WHEEZING  FOR SHORTNESS OF BREATH 04/17/21  Yes Tyler Pita, MD  aspirin EC 81 MG tablet Take 81 mg by mouth daily.    Yes [provider]  azithromycin (ZITHROMAX) 250 MG tablet Take by mouth. 04/17/21  Yes [provider]  b complex-vitamin c-folic acid (NEPHRO-VITE) 0.8 MG TABS tablet Take 1 tablet by mouth at bedtime.   Yes [provider]  budesonide (PULMICORT) 0.25 MG/2ML nebulizer solution Take 2 mLs (0.25 mg total) by nebulization 2 (two) times daily. 01/24/21  Yes Tyler Pita, MD  budesonide (PULMICORT) 0.5 MG/2ML nebulizer solution Take by nebulization. 04/17/21  Yes [provider]  Cholecalciferol 100 MCG (4000 UT) CAPS Take 1 capsule by mouth once a week.   Yes [provider]  citalopram (CELEXA) 20 MG tablet Take 20 mg by mouth at bedtime. 03/08/21  Yes [provider]  clindamycin (CLEOCIN T) 1 % lotion Apply 1 application topically daily. 09/19/20  Yes [provider]  docusate sodium (COLACE) 100 MG capsule Take 100 mg by mouth daily.   Yes [provider]  ferric citrate (AURYXIA) 1 GM 210 MG(Fe) tablet Take 210 mg by mouth 3 (three) times daily with meals.   Yes [provider]  formoterol (PERFOROMIST) 20 MCG/2ML nebulizer solution  01/19/21  Yes [provider]  insulin aspart (NOVOLOG) 100 UNIT/ML injection Inject 0-15 Units into the skin 3 (three) times daily with meals. 04/26/17  Yes Epifanio Lesches, MD  insulin degludec (TRESIBA) 100 UNIT/ML FlexTouch Pen Inject 43 Units into the skin daily at 10 pm. 11/08/20  Yes Kathie Dike, MD  lactulose (CHRONULAC) 10 GM/15ML solution Take 30 mLs (20 g total) by mouth daily as needed for severe constipation. 04/26/17  Yes Epifanio Lesches, MD  levocetirizine (XYZAL) 5 MG tablet Take 5 mg by mouth at bedtime. 05/25/20  Yes [provider]  lidocaine-prilocaine (EMLA) cream Apply topically as needed (For access for dialysis).   Yes  [provider]  LINZESS 290 MCG CAPS capsule Take 290 mcg by mouth every morning. 10/10/20  Yes [provider]  midodrine (PROAMATINE) 10 MG tablet Take 20 mg by mouth daily as needed. 08/22/20  Yes [provider]  Omega-3 Fatty Acids ([employer] NATURAL FISH OIL) 1000 MG CAPS 2 mL 2 TIMES DAILY (route: inhalation) 12/07/20  Yes [provider]  omeprazole (PRILOSEC) 20 MG capsule Take 20 mg by mouth daily. 08/06/20  Yes [provider]  revefenacin (YUPELRI) 175 MCG/3ML nebulizer solution  01/19/21  Yes [provider]  insulin aspart (NOVOLOG) 100 UNIT/ML injection Inject 0-5 Units into the skin at bedtime. Patient not taking: Reported on 04/21/2021 04/26/17   Epifanio Lesches, MD  metoprolol tartrate (LOPRESSOR)  25 MG tablet Take 1 tablet (25 mg total) by mouth 2 (two) times daily. Patient not taking: Reported on 04/21/2021 11/10/20   Kathie Dike, MD  oxyCODONE-acetaminophen (PERCOCET) 5-325 MG tablet Take 1 tablet by mouth every 4 (four) hours as needed for severe pain. Patient not taking: Reported on 04/21/2021 11/08/20   Kathie Dike, MD  OXYGEN Inhale 2-4 L into the lungs.    [provider]    Scheduled Meds:  arformoterol  15 mcg Nebulization Q12H   budesonide  0.5 mg Nebulization BID   Chlorhexidine Gluconate Cloth  6 each Topical Q0600   citalopram  20 mg Oral QHS   ferric citrate  210 mg Oral TID WC   heparin  5,000 Units Subcutaneous Q8H   insulin aspart  0-15 Units Subcutaneous TID WC   insulin glargine-yfgn  18 Units Subcutaneous QHS   linaclotide  290 mcg Oral q morning   loratadine  10 mg Oral QHS   multivitamin  1 tablet Oral QHS   pantoprazole  40 mg Oral QHS   povidone-iodine   Topical Daily   revefenacin  175 mcg Nebulization Daily   sodium chloride flush  3 mL Intravenous Q12H   Continuous Infusions: PRN Meds:.acetaminophen **OR** acetaminophen, albuterol, HYDROmorphone (DILAUDID) injection, lactulose,  lidocaine-prilocaine, midodrine, ondansetron (ZOFRAN) IV, oxyCODONE  Level 4 consult    We will see the patient in follow-up in [place] pulmonary in 4 to 6 weeks time with either me or the nurse practitioner at that time.  We have sent orders to [facility] for the patient's needed equipment.   Renold Don, MD Advanced Bronchoscopy PCCM [facility]    *This note was dictated using voice recognition software/Dragon.  Despite best efforts to proofread, errors can occur which can change the meaning. Any transcriptional errors that result from this process are unintentional and may not be fully corrected at the time of dictation.

## 2021-04-27 NOTE — TOC Progression Note (Signed)
Transition of Care (TOC) - Progression Note  ? ? ?Patient Details  ?Name: [name] ?MRN: [id number] ?Date of Birth: 04/19/1970 ? ?Transition of Care (TOC) CM/SW Contact  ?Pete Pelt, RN ?Phone Number: ?04/27/2021, 10:13 AM ? ?Clinical Narrative:   Caryl Pina at [facility] working on Psychologist, counselling for [place] with [employer].  Patient continues with additional continued medical workup.  TOC to follow. ? ? ? ?Expected Discharge Plan: Home/Self Care ?Barriers to Discharge: Continued Medical Work up ? ?Expected Discharge Plan and Services ?Expected Discharge Plan: Home/Self Care ?  ?Discharge Planning Services: CM Consult ?  ?Living arrangements for the past 2 months: [place] ?                ?  ?  ?  ?  ?  ?HH Arranged: RN ?[place] Agency: [employer] ?Date HH Agency Contacted: 04/23/21 ?Time [place]: 7564 ?Representative spoke with at [place]: Malachy Mood ? ? ?Social Determinants of Health (SDOH) Interventions ?  ? ?Readmission Risk Interventions ?No flowsheet data found. ? ?

## 2021-04-27 NOTE — Interval H&P Note (Signed)
History and Physical Interval Note: ? ?04/27/2021 ?3:04 PM ? ?[name]  has presented today for surgery, with the diagnosis of osteomyelitis.  The various methods of treatment have been discussed with the patient and family. After consideration of risks, benefits and other options for treatment, the patient has consented to  Procedure(s): ?Right 4th metatarsal BONE BIOPSY (Right) as a surgical intervention.  The patient's history has been reviewed, patient examined, no change in status, stable for surgery.  I have reviewed the patient's chart and labs.  Questions were answered to the patient's satisfaction.   ? ? ?[name] ? ? ?

## 2021-04-27 NOTE — Telephone Encounter (Signed)
Per Dr. Patsey Berthold verbally- patient will need auto bipap  ?Min EPAP 8CM max IPAP 25cm ?PS min 4cm h2O ?PS max 8cm H2O ? ?Order placed for bipap.  ?

## 2021-04-27 NOTE — Telephone Encounter (Addendum)
Noted.  ?Bipap ordered.  ?Caryl Pina with [facility] is aware of recent ONO.  ?

## 2021-04-27 NOTE — Addendum Note (Signed)
Addended by: Claudette Head A on: 04/27/2021 11:03 AM ? ? Modules accepted: Orders ? ?

## 2021-04-27 NOTE — Telephone Encounter (Signed)
Spoke to [place] with [employer] for update. ? ?He stated that patient had ONO, however results have not been uploaded yet. ?Caryl Pina suggested going ahead and placing another order for ONO and order bipap.  ?Insurance continues to deny vent. ? ?Dr. Patsey Berthold, please advise. Thanks ?

## 2021-04-27 NOTE — Telephone Encounter (Signed)
She had ONO in the hospital last night, on 3 L/min oxygen saturations were as low as 69%. ?

## 2021-04-27 NOTE — Progress Notes (Signed)
Brief ID note: ? ?Patient awaiting bone biopsy and culture today with podiatry.  Remains off antibiotics and is stable without any leukocytosis or fevers. ? ?Post biopsy/cultures, would start vancomycin and cefepime while awaiting cultures.  Again, she has extensive disease in her foot with significant gangrenous and ischemic changes.  She has an incurable condition with new MRI showing extensive osteomyelitis in her right metatarsals.  Would recommend transtibial amputation in this setting, but patient consistently declines this intervention and desires attempt at limb salvage. ? ?Dr Baxter Flattery will take over on Monday.  At discharge, patient can continue to follow-up with Dr. Delaine Lame whom she has followed previously. ? ? ?Mignon Pine ?[facility] for Infectious Disease ?[facility] ?04/27/2021, 10:12 AM ? ?

## 2021-04-28 DIAGNOSIS — J449 Chronic obstructive pulmonary disease, unspecified: Secondary | ICD-10-CM | POA: Diagnosis not present

## 2021-04-28 DIAGNOSIS — J9611 Chronic respiratory failure with hypoxia: Secondary | ICD-10-CM | POA: Diagnosis not present

## 2021-04-28 DIAGNOSIS — K859 Acute pancreatitis without necrosis or infection, unspecified: Secondary | ICD-10-CM | POA: Diagnosis not present

## 2021-04-28 DIAGNOSIS — M86071 Acute hematogenous osteomyelitis, right ankle and foot: Secondary | ICD-10-CM | POA: Diagnosis not present

## 2021-04-28 LAB — GLUCOSE, CAPILLARY
Glucose-Capillary: 238 mg/dL — ABNORMAL HIGH (ref 70–99)
Glucose-Capillary: 206 mg/dL — ABNORMAL HIGH (ref 70–99)
Glucose-Capillary: 226 mg/dL — ABNORMAL HIGH (ref 70–99)
Glucose-Capillary: 172 mg/dL — ABNORMAL HIGH (ref 70–99)
Glucose-Capillary: 399 mg/dL — ABNORMAL HIGH (ref 70–99)

## 2021-04-28 LAB — RENAL FUNCTION PANEL
Albumin: 4.1 g/dL (ref 3.5–5.0)
Anion gap: 16 — ABNORMAL HIGH (ref 5–15)
BUN: 60 mg/dL — ABNORMAL HIGH (ref 6–20)
CO2: 25 mmol/L (ref 22–32)
Calcium: 9.5 mg/dL (ref 8.9–10.3)
Chloride: 91 mmol/L — ABNORMAL LOW (ref 98–111)
Creatinine, Ser: 9.25 mg/dL — ABNORMAL HIGH (ref 0.44–1.00)
GFR, Estimated: 5 mL/min — ABNORMAL LOW (ref >60)
Glucose, Bld: 214 mg/dL — ABNORMAL HIGH (ref 70–99)
Phosphorus: 7.9 mg/dL — ABNORMAL HIGH (ref 2.5–4.6)
Potassium: 5.1 mmol/L (ref 3.5–5.1)
Sodium: 132 mmol/L — ABNORMAL LOW (ref 135–145)

## 2021-04-28 MED ORDER — VANCOMYCIN HCL 2000 MG/400ML IV SOLN
2000.0000 mg | Freq: Once | INTRAVENOUS | Status: AC
  Administered 2021-04-28: 2000 mg via INTRAVENOUS
  Filled 2021-04-28: qty 400

## 2021-04-28 MED ORDER — SODIUM CHLORIDE 0.9 % IV SOLN
1.0000 g | INTRAVENOUS | Status: DC
  Administered 2021-04-28 – 2021-04-30 (×3): 1 g via INTRAVENOUS
  Filled 2021-04-28 (×4): qty 1

## 2021-04-28 MED ORDER — SODIUM CHLORIDE 0.9 % IV SOLN: INTRAVENOUS | Status: DC | PRN

## 2021-04-28 NOTE — Progress Notes (Addendum)
Pharmacy Antibiotic Note ? ?[name] is a 51 y.o. female admitted on 04/21/2021 with osteomyelitis. PMH HTN, COPD on 3-4L, ESRD on HD TTS, T2DM, OM of R foot s/p fifth ray amputation with poorly healing ulcers. Pharmacy has been consulted for vancomycin and cefepime dosing. ? ?Xray suggestive and MRI confirmed osteomyelitis.  ? ?Plan:  ?Cefepime 1 gram every 24 hours ? ?Vancomycin 2000 mg x 1 pre-HD ?Maintenance doses vancomycin 1,000 mg post-HD on HD days (TTS). ?Planning for HD today (3/4, Saturday).  ?Monitor clinical course, LOT. Plan to obtain weekly vanc level. ? ?Height: '5\' 4"'$  (162.6 cm) ?Weight: 120.5 kg (265 lb 10.5 oz) ?IBW/kg (Calculated) : 54.7 ? ?Temp (24hrs), Avg:97.8 ?F (36.6 ?C), Min:97 ?F (36.1 ?C), Max:98.3 ?F (36.8 ?C) ? ?Recent Labs  ?Lab 04/22/21 ?0354 04/23/21 ?5397 04/23/21 ?6734 04/24/21 ?1937 04/25/21 ?9024 04/26/21 ?0973 04/27/21 ?5329 04/28/21 ?0650  ?WBC 8.9  --  6.6  --  6.2  --   --   --   ?CREATININE 8.61*   < >  --  11.56* 8.34* 10.04* 7.96* 9.25*  ? < > = values in this interval not displayed.  ?  ?Estimated Creatinine Clearance: 9.4 mL/min (A) (by C-G formula based on SCr of 9.25 mg/dL (H)).   ? ?Allergies  ?Allergen Reactions  ? Dust Mite Extract Shortness Of Breath and Itching  ? Mold Extract [Trichophyton] Shortness Of Breath and Itching  ? Pollen Extract Shortness Of Breath  ?  Itching, shortness of breath, asthma like symptoms  ? Sulfa Antibiotics Itching  ? Feraheme [Ferumoxytol] Itching  ?  Uses benadryl so she can get the medicine  ? ? ?Antimicrobials this admission: ?vancomycin 3/4 >>  ?cefepime 3/4 >>  ? ?Dose adjustments this admission: ?N/a ? ?Microbiology results: ?3/3 Fourth metatarsal path: NG < 24 hours ?3/3 Right foot 4th toe: rare WBC, both PMN and mononuclear, no orgs ? ?Thank you for allowing pharmacy to be a part of this patient?s care. ? ?Wynelle Cleveland, PharmD ?Pharmacy Resident  ?04/28/2021 ?2:40 PM ? ? ?

## 2021-04-28 NOTE — Progress Notes (Signed)
?PROGRESS NOTE ? ?[name]    DOB: 1970/10/15, 51 y.o.  ?CBJ:[id number]  ?  Code Status: Full Code   ?DOA: 04/21/2021   LOS: 7  ? ?Brief hospital course  ?[name] is a 51 y.o. female with a PMH significant for HTN, COPD on 3-4 L nasal cannula chronically, ESRD on HD TTS, type II DM, known osteomyelitis of right foot s/p fifth ray amputation with poorly healing ulcers. ?They presented from home to the ED on 04/21/2021 with epigastric pain, nausea, vomiting x 4 days.  At the same time, she also had increased sputum production with cough so was started on azithromycin but unable to tolerate the full treatment. ?In the ED, it was found that they had pancreatitis and likely COPD exacerbation.  ?They were treated with analgesia, bowel rest, IV fluids.  GI was consulted. ?Imaging suggested osteomyelitis of right foot. ?Additionally, she was mildly hyponatremic and hyperkalemic due to missing several dialysis sessions. ?Patient was admitted to medicine service for further workup and management of pancreatitis, osteomyelitis, electrolyte abnormalities as outlined in detail below. ? ?04/28/21 -stable, improved ? ?Assessment & Plan  ?Principal Problem: ?  Pancreatitis ?Active Problems: ?  Obesity, Class III, BMI 40-49.9 (morbid obesity) ([place]) ?  Type 2 diabetes mellitus with ESRD (end-stage renal disease) ([place]) ?  COPD with chronic bronchitis ([place]) ?  ESRD (end stage renal disease) ([place]) ?  Chronic respiratory failure with hypoxia ([facility]) ?  Diabetic foot ulcer ([place]) ?  Hyponatremia ?  Metabolic acidosis, increased anion gap ?  GERD (gastroesophageal reflux disease) ?  Acute hematogenous osteomyelitis of right foot ([place]) ?  History of osteomyelitis ?  RUQ pain ?  Status post thoracentesis ? ?Pancreatitis- improving. Patient still endorsing epigastric pain but is tolerating soft diet well. Repeat CT abdomen 2/28 showed normal progression of pancreatitis without necrosis. Suspect her pain is more related to her  constipation at this time. ?- GI signed off ?- advance diet as tolerated ?- continue analgesia PRN and taper off as tolerated  ? ?Hyponatremia-  stable- Na+ 130>131>132 ?- management concurrently with HD ?  ?Diabetic foot ulcer  osteomyelitis- previously diagnosed with gangrene and associated osteomyelitis requiring fifth ray amputation of the foot 09/2020. ?- xray suggestive of osteo> MRI confirmed osteomyelitis ?- patient refuses further amputation. Consulted ID for recommendations in guiding Abx course. She will receive IV Abx at HD for duration of therapy ?- podiatry consulted for biopsy to guide Abx course 3/3 ?- start IV vanc, cefepime per pharmacy 3/4 ?- wound care ?  ?Chronic respiratory failure with hypoxia ([place])- chronic, stable ?Chronic respiratory failure on 3 L nasal cannula oxygen 24/7 with O2 saturations currently maintained.  Patient had recently had a sleep study done in the outpatient setting, but has not heard back regarding results.  CT imaging did note concern for right-sided pleural effusion. -Patient is currently at baseline O2 requirements at 3-4L by [place] to maintain saturation of 92% ?-Fluid management with dialysis ?-Follow-up with pulmonology in outpatient setting regarding sleep study ?- bronchodilators PRN ?- complete azithromycin course ?  ?ESRD on HD TThS  hyperkalemia (resolved)-  ?- nephrology managing, appreciate recs ?  ?Type 2 diabetes mellitus with ESRD (end-stage renal disease) ([place])- will impact wound healing ?- sSSI ?- semglee daily ?  ?GERD ? ?Body mass index is 43.82 kg/m?. ? ?VTE ppx: heparin injection 5,000 Units Start: 04/22/21 0045 ? ? ?Diet:  ?   ?Diet  ? Diet renal/carb modified with fluid restriction Diet-HS  Snack? Nothing; Fluid restriction: 1200 mL Fluid; Room service appropriate? Yes; Fluid consistency: Thin  ? ?Subjective 04/28/21   ? ?Pt reports no complaints today. She is doing well. Has had small BM and increased gas but no relief. ?  ?Objective  ? ?Vitals:  ?  04/27/21 1942 04/27/21 2058 04/28/21 0357 04/28/21 0731  ?BP:  121/72 95/81   ?Pulse:  91 89   ?Resp:  16 18   ?Temp:  98.2 ?F (36.8 ?C) 98.3 ?F (36.8 ?C)   ?TempSrc:  Oral Oral   ?SpO2: 97% 94% 92% 95%  ?Weight:      ?Height:      ? ? ?Intake/Output Summary (Last 24 hours) at 04/28/2021 0820 ?Last data filed at 04/27/2021 1531 ?Gross per 24 hour  ?Intake 100 ml  ?Output --  ?Net 100 ml  ? ? ?Filed Weights  ? 04/24/21 1329 04/26/21 1017 04/26/21 1330  ?Weight: 119.2 kg 118 kg 115.8 kg  ?  ? ?Physical Exam:  ?General: awake, alert, NAD ?HEENT: atraumatic, clear conjunctiva, anicteric sclera, MMM, hearing grossly normal ?Respiratory: normal respiratory effort. ?Gastrointestinal: soft, tender to epigastric area ?Nervous: A&O x3. no gross focal neurologic deficits, normal speech ?Extremities: R foot with packed ulcers on plantar and dorsal surfaces. Poor perfusion. Poor sensation.  ?Skin: dry, intact, normal temperature. Lesions on face ?Psychiatry: normal mood, congruent affect ? ?Labs   ?I have personally reviewed the following labs and imaging studies ?CBC ?   ?Component Value Date/Time  ? WBC 6.2 04/25/2021 0625  ? RBC 4.15 04/25/2021 0625  ? HGB 11.8 (L) 04/25/2021 4034  ? HGB 12.4 01/11/2014 1324  ? HCT 37.9 04/25/2021 0625  ? HCT 39.2 01/11/2014 1324  ? PLT 267 04/25/2021 0625  ? PLT 370 01/11/2014 1324  ? MCV 91.3 04/25/2021 0625  ? MCV 86 01/11/2014 1324  ? MCH 28.4 04/25/2021 0625  ? MCHC 31.1 04/25/2021 0625  ? RDW 14.9 04/25/2021 0625  ? RDW 15.1 (H) 01/11/2014 1324  ? LYMPHSABS 0.9 04/25/2021 0625  ? LYMPHSABS 1.8 01/11/2014 1324  ? MONOABS 0.6 04/25/2021 0625  ? MONOABS 0.5 01/11/2014 1324  ? EOSABS 0.2 04/25/2021 0625  ? EOSABS 0.2 01/11/2014 1324  ? BASOSABS 0.0 04/25/2021 0625  ? BASOSABS 0.1 01/11/2014 1324  ? ?BMP Latest Ref Rng & Units 04/28/2021 04/27/2021 04/26/2021  ?Glucose 70 - 99 mg/dL 214(H) 236(H) 135(H)  ?BUN 6 - 20 mg/dL 60(H) 46(H) 57(H)  ?Creatinine 0.44 - 1.00 mg/dL 9.25(H) 7.96(H) 10.04(H)   ?Sodium 135 - 145 mmol/L 132(L) 131(L) 130(L)  ?Potassium 3.5 - 5.1 mmol/L 5.1 4.4 5.0  ?Chloride 98 - 111 mmol/L 91(L) 93(L) 90(L)  ?CO2 22 - 32 mmol/L '25 26 24  '$ ?Calcium 8.9 - 10.3 mg/dL 9.5 8.8(L) 9.1  ? ? ?No results found. ? ?Disposition Plan & Communication  ?Patient status: Inpatient  ?Admitted From: Home ?Planned disposition location: Home ?Anticipated discharge date: 3/6 pending biopsy and antibiotic choice ? ?Family Communication: none  ?  ?Author: ?Richarda Osmond, DO ?[facility] ?04/28/2021, 8:20 AM  ? ?Available by Epic secure chat 7AM-7PM. ?If 7PM-7AM, please contact night-coverage.  ?[facility] contact information found on [email or web address]. ? ?

## 2021-04-28 NOTE — Anesthesia Postprocedure Evaluation (Signed)
Anesthesia Post Note ? ?Patient: [name] ? ?Procedure(s) Performed: Right 4th metatarsal BONE BIOPSY (Right) ? ?Patient location during evaluation: PACU ?Anesthesia Type: General ?Level of consciousness: awake and alert ?Pain management: pain level controlled ?Vital Signs Assessment: post-procedure vital signs reviewed and stable ?Respiratory status: spontaneous breathing, nonlabored ventilation, respiratory function stable and patient connected to nasal cannula oxygen ?Cardiovascular status: blood pressure returned to baseline and stable ?Postop Assessment: no apparent nausea or vomiting ?Anesthetic complications: no ? ? ?No notable events documented. ? ? ?Last Vitals:  ?Vitals:  ? 04/28/21 2000 04/28/21 2017  ?BP:  (!) 121/56  ?Pulse:  86  ?Resp:  16  ?Temp:  36.7 ?C  ?SpO2: 98% 98%  ?  ?Last Pain:  ?Vitals:  ? 04/28/21 2212  ?TempSrc:   ?PainSc: 8   ? ? ?  ?  ?  ?  ?  ?  ? ?Martha Clan ? ? ? ? ?

## 2021-04-28 NOTE — Progress Notes (Signed)
[name]  MRN: [id number]  DOB/AGE: 05/05/1970 51 y.o.  Primary Care Physician:[name], NP  Admit date: 04/21/2021  Chief Complaint:  Chief Complaint  Patient presents with   Abdominal Pain   Nausea    S-Pt presented on  04/21/2021 with  Chief Complaint  Patient presents with   Abdominal Pain   Nausea  . Patient is a 51 year old African-American female with a past medical history of end-stage renal disease, patient is on Tuesday Thursday Saturday schedule, diabetes mellitus, history of pancreatitis who came to the ER with chief complaint of abdominal pain.  Patient is sitting comfortably in the bed Patient states she is feeling better than before   WLN:[id number] from the symptoms mentioned above,there are no other symptoms referable to all systems reviewed.  Physical Exam: Vital signs in last 24 hours: Temp:  [97 F (36.1 C)-98.4 F (36.9 C)] 98.3 F (36.8 C) (03/04 0357) Pulse Rate:  [83-94] 89 (03/04 0357) Resp:  [16-22] 18 (03/04 0357) BP: (95-147)/(66-84) 95/81 (03/04 0357) SpO2:  [91 %-100 %] 92 % (03/04 0357) Weight change:  Last BM Date : 04/21/21  Intake/Output from previous day: 03/03 0701 - 03/04 0700 In: 100 [I.V.:100] Out: -  No intake/output data recorded.   Physical Exam:  General- pt is awake,alert, oriented to time place and person  Resp- No acute REsp distress, CTA B/L NO Rhonchi  CVS- S1S2 regular in rate and rhythm  GIT- BS+, soft, minimal tenderness  , Non distended  EXT- trace LE Edema,  No Cyanosis right foot dressing  Access-  LUE AVF  Lab Results:  CBC  Recent Labs    04/25/21 0625  WBC 6.2  HGB 11.8*  HCT 37.9  PLT 267    BMET  Recent Labs    04/26/21 0616 04/27/21 0535  NA 130* 131*  K 5.0 4.4  CL 90* 93*  CO2 24 26  GLUCOSE 135* 236*  BUN 57* 46*  CREATININE 10.04* 7.96*  CALCIUM 9.1 8.8*      Most recent Creatinine trend  Lab Results  Component Value Date   CREATININE 7.96 (H)  04/27/2021   CREATININE 10.04 (H) 04/26/2021   CREATININE 8.34 (H) 04/25/2021       MICRO   Recent Results (from the past 240 hour(s))  Resp Panel by RT-PCR (Flu A&B, Covid) Nasopharyngeal Swab     Status: None   Collection Time: 04/21/21 12:16 PM   Specimen: Nasopharyngeal Swab; Nasopharyngeal(NP) swabs in vial transport medium  Result Value Ref Range Status   SARS Coronavirus 2 by RT PCR NEGATIVE NEGATIVE Final    Comment: (NOTE) SARS-CoV-2 target nucleic acids are NOT DETECTED.  The SARS-CoV-2 RNA is generally detectable in upper respiratory specimens during the acute phase of infection. The lowest concentration of SARS-CoV-2 viral copies this assay can detect is 138 copies/mL. A negative result does not preclude SARS-Cov-2 infection and should not be used as the sole basis for treatment or other patient management decisions. A negative result may occur with  improper specimen collection/handling, submission of specimen other than nasopharyngeal swab, presence of viral mutation(s) within the areas targeted by this assay, and inadequate number of viral copies(<138 copies/mL). A negative result must be combined with clinical observations, patient history, and epidemiological information. The expected result is Negative.  Fact Sheet for Patients:  EntrepreneurPulse.com.au  Fact Sheet for Healthcare Providers:  IncredibleEmployment.be  This test is no t yet approved or cleared by the [place] FDA and  has  been authorized for detection and/or diagnosis of SARS-CoV-2 by FDA under an Emergency Use Authorization (EUA). This EUA will remain  in effect (meaning this test can be used) for the duration of the COVID-19 declaration under Section 564(b)(1) of the Act, 21 U.S.C.section 360bbb-3(b)(1), unless the authorization is terminated  or revoked sooner.       Influenza A by PCR NEGATIVE NEGATIVE Final   Influenza B by PCR NEGATIVE  NEGATIVE Final    Comment: (NOTE) The Xpert Xpress SARS-CoV-2/FLU/RSV plus assay is intended as an aid in the diagnosis of influenza from Nasopharyngeal swab specimens and should not be used as a sole basis for treatment. Nasal washings and aspirates are unacceptable for Xpert Xpress SARS-CoV-2/FLU/RSV testing.  Fact Sheet for Patients: EntrepreneurPulse.com.au  Fact Sheet for Healthcare Providers: IncredibleEmployment.be  This test is not yet approved or cleared by the [place] FDA and has been authorized for detection and/or diagnosis of SARS-CoV-2 by FDA under an Emergency Use Authorization (EUA). This EUA will remain in effect (meaning this test can be used) for the duration of the COVID-19 declaration under Section 564(b)(1) of the Act, 21 U.S.C. section 360bbb-3(b)(1), unless the authorization is terminated or revoked.  Performed at [facility], [address] [place], [place] [address]   MRSA Next Gen by PCR, Nasal     Status: None   Collection Time: 04/23/21  6:55 AM   Specimen: Nasal Mucosa; Nasal Swab  Result Value Ref Range Status   MRSA by PCR Next Gen NOT DETECTED NOT DETECTED Final    Comment: (NOTE) The GeneXpert MRSA Assay (FDA approved for NASAL specimens only), is one component of a comprehensive MRSA colonization surveillance program. It is not intended to diagnose MRSA infection nor to guide or monitor treatment for MRSA infections. Test performance is not FDA approved in patients less than 74 years old. Performed at [facility], [address] [place], [place] [address]   Aerobic/Anaerobic Culture w Gram Stain (surgical/deep wound)     Status: None (Preliminary result)   Collection Time: 04/27/21  3:39 PM   Specimen: PATH Amputaion Arm/Leg; Tissue  Result Value Ref Range Status   Specimen Description   Final    TISSUE  FOURTH METATARSAL Performed at [facility], [place] [address] [place], [place] [address]    Special Requests   Final    NONE Performed at [facility], [place], [place] [address]    Gram Stain   Final    RARE WBC PRESENT,BOTH PMN AND MONONUCLEAR RARE GRAM NEGATIVE RODS Performed at [facility], [place] [address], [place], [place] [address]    Culture PENDING  Incomplete   Report Status PENDING  Incomplete  Aerobic/Anaerobic Culture w Gram Stain (surgical/deep wound)     Status: None (Preliminary result)   Collection Time: 04/27/21  3:41 PM   Specimen: PATH Amputaion Arm/Leg; Tissue  Result Value Ref Range Status   Specimen Description   Final    TISSUE RIGHT FOOT FOURTH TOE Performed at [facility], [place] [address], [place], [place] [address]    Special Requests   Final    NONE Performed at [facility], [address] [place], [place] [address]    Gram Stain   Final    RARE WBC PRESENT,BOTH PMN AND MONONUCLEAR NO ORGANISMS SEEN Performed at [facility], [place] [address] [place], [place] [address]    Culture PENDING  Incomplete  Report Status PENDING  Incomplete     CT abdomen done on April 21, 2021. IMPRESSION: Enlarged pancreatic head with adjacent peripancreatic soft tissue stranding. This is suspicious for focal pancreatitis, although pancreatic carcinoma cannot definitely be excluded. Recommend correlation with serum lipase level, and continued follow-up by pancreatic protocol MRI or CT without and with contrast.   Right-sided pleural thickening and rounded opacity in posterior right lower lobe, new since 2016 exam. Rounded atelectasis is favored, however pulmonary infiltrate or neoplasm cannot definitely be excluded. Recommend clinical correlation, and short-term follow-up with chest CT or PET-CT.   Impression:    Patient is well-known to our practice as patient undergoes dialysis at [facility] dialysis under care of Dr. Murlean Iba Patient is on Tuesday  Thursday Saturday schedule Patient has a target dry weight of 119.5 kg  1)  End-stage renal disease on dialysis. Patient is to be dialyzed today   2)HTN with chronic kidney disease  Currently only receiving midodrine 20 mg as needed for low blood pressure.   BP stable for this patient  3)Anemia of chronic disease  CBC Latest Ref Rng & Units 04/25/2021 04/23/2021 04/22/2021  WBC 4.0 - 10.5 K/uL 6.2 6.6 8.9  Hemoglobin 12.0 - 15.0 g/dL 11.8(L) 13.5 12.2  Hematocrit 36.0 - 46.0 % 37.9 44.1 40.4  Platelets 150 - 400 K/uL 267 295 301    Patient did receive Mircera as outpatient Hemoglobin stable  4) Secondary hyperparathyroidism -CKD Mineral-Bone Disorder/Renal failure associated hyperphosphatemia   Lab Results  Component Value Date   PTH 134 (H) 04/21/2017   CALCIUM 8.8 (L) 04/27/2021   CAION 0.81 (LL) 10/26/2020   PHOS 5.9 (H) 04/27/2021   Binders held due to pancreatitis.   5)Pancreatitis  Tolerating advanced diet. Continue supportive care    Merlin Ege s Theador Hawthorne 04/28/2021, 5:26 AM

## 2021-04-28 NOTE — Plan of Care (Signed)
Dressing change performed on R foot.  Areas of white slough on inside/medial side of foot need to be debrided.  Cleaned w/NS and betadine soaked gauze placed around foot, wrapped w/kerlix and ace wrap.  Pt doesn't want amputation of leg but may be more responsive about partial foot amputation. Wound really needs continuous f/u by wound nurse.  ?

## 2021-04-28 NOTE — Progress Notes (Signed)
Patient is status post fourth metatarsal/proximal phalanx bone biopsy multiple sites.  The bone was soft friable intraoperatively.  Extensive gangrene noted.  Our recommendation still below the knee amputation.  At this time patient would like to continue salvaging the foot with IV antibiotics.  Rest of the care per primary team and infectious disease. ? ?Podiatry to sign off.  Please reconsult [place] as needed. ?

## 2021-04-28 NOTE — Discharge Summary (Incomplete)
Physician Discharge Summary  Patient: [name] UTM:[id number] DOB: Dec 26, 1970   Code Status: Full Code Admit date: 04/21/2021 Discharge date: 05/01/2021 Disposition: Home, No home health services recommended PCP: [name], NP  Recommendations for Outpatient Follow-up:  Follow up with PCP within 1-2 weeks Consider a rolling knee scooter to encourage mobility and still able to keep weight off foot wound Follow up with podiatry Follow up with wound care Follow up with nephrology IV vancomycin and cefepime to be given at HD for at least 8 weeks, per ID recommendations (start date 3/3) Weekly vanc level Follow up with ID Follow up with pulmonology  Discharge Diagnoses:  Principal Problem:   Pancreatitis Active Problems:   Obesity, Class III, BMI 40-49.9 (morbid obesity) ([place])   Type 2 diabetes mellitus with ESRD (end-stage renal disease) ([place])   COPD with chronic bronchitis ([place])   ESRD (end stage renal disease) ([place])   Chronic respiratory failure with hypoxia ([place])   Diabetic foot ulcer ([place])   Hyponatremia   Metabolic acidosis, increased anion gap   GERD (gastroesophageal reflux disease)   Acute hematogenous osteomyelitis of right foot ([place])   History of osteomyelitis   RUQ pain   Status post thoracentesis   SOB (shortness of breath)  Brief Hospital Course Summary: Pt ***  Discharge Condition: {DISCHARGE CONDITION:19696}, improved Recommended discharge diet: {Discharge FKCL:[id number]}  Consultations: ***  Procedures/Studies: ***   Allergies as of 05/01/2021       Reactions   Dust Mite Extract Shortness Of Breath, Itching   Mold Extract [trichophyton] Shortness Of Breath, Itching   Pollen Extract Shortness Of Breath   Itching, shortness of breath, asthma like symptoms   Sulfa Antibiotics Itching   Feraheme [ferumoxytol] Itching   Uses benadryl so she can get the medicine        Medication List     STOP taking these medications     azithromycin 250 MG tablet Commonly known as: ZITHROMAX   docusate sodium 100 MG capsule Commonly known as: COLACE   metoprolol tartrate 25 MG tablet Commonly known as: LOPRESSOR       TAKE these medications    acetaminophen 500 MG tablet Commonly known as: TYLENOL Take 1,000 mg by mouth every 6 (six) hours as needed for mild pain, fever or headache.   albuterol (2.5 MG/3ML) 0.083% nebulizer solution Commonly known as: PROVENTIL Take 3 mLs (2.5 mg total) by nebulization every 6 (six) hours as needed for wheezing or shortness of breath.   albuterol 108 (90 Base) MCG/ACT inhaler Commonly known as: VENTOLIN HFA INHALE 2 PUFFS BY MOUTH EVERY 4 HOURS AS NEEDED FOR WHEEZING FOR SHORTNESS OF BREATH   aspirin EC 81 MG tablet Take 81 mg by mouth daily.   b complex-vitamin c-folic acid 0.8 MG Tabs tablet Take 1 tablet by mouth at bedtime.   budesonide 0.25 MG/2ML nebulizer solution Commonly known as: PULMICORT Take 2 mLs (0.25 mg total) by nebulization 2 (two) times daily.   budesonide 0.5 MG/2ML nebulizer solution Commonly known as: PULMICORT Take by nebulization.   ceFEPIme 2 g in sodium chloride 0.9 % 100 mL Inject 2 g into the vein Every Tuesday,Thursday,and Saturday with dialysis.   Cholecalciferol 100 MCG (4000 UT) Caps Take 1 capsule by mouth once a week.   citalopram 20 MG tablet Commonly known as: CELEXA Take 20 mg by mouth at bedtime.   clindamycin 1 % lotion Commonly known as: CLEOCIN T Apply 1 application topically daily.   [employer]  Natural Fish Oil 1000 MG Caps 2 mL 2 TIMES DAILY (route: inhalation)   ferric citrate 1 GM 210 MG(Fe) tablet Commonly known as: AURYXIA Take 210 mg by mouth 3 (three) times daily with meals.   fluconazole 200 MG tablet Commonly known as: DIFLUCAN Take 1 tablet (200 mg total) by mouth at bedtime. Start taking on: May 02, 2021   formoterol 20 MCG/2ML nebulizer solution Commonly known as: PERFOROMIST   insulin aspart 100  UNIT/ML injection Commonly known as: novoLOG Inject 0-15 Units into the skin 3 (three) times daily with meals. What changed: Another medication with the same name was removed. Continue taking this medication, and follow the directions you see here.   insulin degludec 100 UNIT/ML FlexTouch Pen Commonly known as: TRESIBA Inject 30 Units into the skin daily. What changed:  how much to take when to take this   lactulose 10 GM/15ML solution Commonly known as: CHRONULAC Take 30 mLs (20 g total) by mouth daily as needed for severe constipation.   levocetirizine 5 MG tablet Commonly known as: XYZAL Take 5 mg by mouth at bedtime.   lidocaine-prilocaine cream Commonly known as: EMLA Apply topically as needed (For access for dialysis).   Linzess 290 MCG Caps capsule Generic drug: linaclotide Take 290 mcg by mouth every morning.   midodrine 10 MG tablet Commonly known as: PROAMATINE Take 2 tablets (20 mg total) by mouth daily as needed (low blood pressure). What changed: reasons to take this   omeprazole 20 MG capsule Commonly known as: PRILOSEC Take 20 mg by mouth daily.   oxyCODONE-acetaminophen 5-325 MG tablet Commonly known as: Percocet Take 1 tablet by mouth every 4 (four) hours as needed for severe pain.   OXYGEN Inhale 2-4 L into the lungs.   vancomycin 1-5 GM/200ML-% Soln Commonly known as: VANCOCIN Inject 200 mLs (1,000 mg total) into the vein Every Tuesday,Thursday,and Saturday with dialysis.   Yupelri 175 MCG/3ML nebulizer solution Generic drug: revefenacin         Subjective   Pt reports *** Objective  Blood pressure 95/81, pulse 89, temperature 98.3 F (36.8 C), temperature source Oral, resp. rate 18, height '5\' 4"'$  (1.626 m), weight 115.8 kg, last menstrual period 09/06/2016, SpO2 95 %.   General: Pt is alert, awake, not in acute distress Cardiovascular: RRR, S1/S2 +, no rubs, no gallops Respiratory: CTA bilaterally, no wheezing, no rhonchi Abdominal:  Soft, NT, ND, bowel sounds + Extremities: no edema, no cyanosis   The results of significant diagnostics from this hospitalization (including imaging, microbiology, ancillary and laboratory) are listed below for reference.   Imaging studies: CT ABDOMEN PELVIS WO CONTRAST  Result Date: 04/21/2021 CLINICAL DATA:  Abdominal pain and nausea and vomiting. Dialysis patient. EXAM: CT ABDOMEN AND PELVIS WITHOUT CONTRAST TECHNIQUE: Multidetector CT imaging of the abdomen and pelvis was performed following the standard protocol without IV contrast. RADIATION DOSE REDUCTION: This exam was performed according to the departmental dose-optimization program which includes automated exposure control, adjustment of the mA and/or kV according to patient size and/or use of iterative reconstruction technique. COMPARISON:  10/28/2014 FINDINGS: Lower chest: Right-sided pleural thickening noted. Rounded opacity is seen in the posterior right lower lobe abutting the pleural surface. These findings are new since prior exam in 2016. Rounded atelectasis is favored, however pulmonary infiltrate or neoplasm cannot definitely be excluded. Hepatobiliary: No mass visualized on this unenhanced exam. Gallbladder is unremarkable. No evidence of biliary ductal dilatation. Pancreas: The pancreatic head is enlarged, with adjacent peripancreatic soft tissue  stranding. This is new since previous study and is suspicious for focal pancreatitis, although pancreatic carcinoma cannot definitely be excluded. No definite evidence of pancreatic ductal dilatation on this unenhanced exam. No evidence of peripancreatic fluid collections. Spleen:  Within normal limits in size. Adrenals/Urinary tract: No evidence of urolithiasis or hydronephrosis. Unremarkable unopacified urinary bladder. Stomach/Bowel: No evidence of obstruction, inflammatory process, or abnormal fluid collections. Normal appendix visualized. Vascular/Lymphatic: No pathologically enlarged  lymph nodes identified. No evidence of abdominal aortic aneurysm. Aortic atherosclerotic calcification noted. Reproductive:  No mass or other significant abnormality. Other:  None. Musculoskeletal:  No suspicious bone lesions identified. IMPRESSION: Enlarged pancreatic head with adjacent peripancreatic soft tissue stranding. This is suspicious for focal pancreatitis, although pancreatic carcinoma cannot definitely be excluded. Recommend correlation with serum lipase level, and continued follow-up by pancreatic protocol MRI or CT without and with contrast. Right-sided pleural thickening and rounded opacity in posterior right lower lobe, new since 2016 exam. Rounded atelectasis is favored, however pulmonary infiltrate or neoplasm cannot definitely be excluded. Recommend clinical correlation, and short-term follow-up with chest CT or PET-CT. Electronically Signed   By: Marlaine Hind M.D.   On: 04/21/2021 12:50   MR FOOT RIGHT WO CONTRAST  Result Date: 04/25/2021 CLINICAL DATA:  Possible osteomyelitis of the right fourth metatarsal. Prior fifth metatarsal amputation. EXAM: MRI OF THE RIGHT FOREFOOT WITHOUT CONTRAST TECHNIQUE: Multiplanar, multisequence MR imaging of the right forefoot was performed. No intravenous contrast was administered. COMPARISON:  None. FINDINGS: Bones/Joint/Cartilage Prior amputation of the distal half of the fifth metatarsal and phalanges. Soft tissue wound along the lateral forefoot overlying the fourth MTP joint. Bone destruction and marrow edema within the fourth metatarsal head and base of the fourth proximal phalanx concerning for osteomyelitis. Bone marrow edema in the third metatarsal head and base of the third proximal phalanx which may be reactive versus osteomyelitis. Severe bone marrow edema in the first proximal phalanx concerning for osteomyelitis. Severe bone marrow edema in the second proximal phalanx concerning for osteomyelitis. Moderate osteoarthritis of the first MTP joint.  Bone marrow edema in the medial and lateral hallux sesamoids likely secondary to arthritis. Old healed second metatarsal neck fracture. Old healed third metatarsal neck fracture. Hallux valgus.  No joint effusion. Ligaments Collateral ligaments are intact.  Lisfranc ligament is intact. Muscles and Tendons Flexor, peroneal and extensor compartment tendons are intact. Diffuse T2 hyperintense signal throughout the plantar musculature likely neurogenic with atrophy. Soft tissue Large soft tissue wound along the plantar aspect of the medial aspect of the midfoot. No soft tissue mass. IMPRESSION: 1. Soft tissue wound along the lateral forefoot overlying the fourth MTP joint. Osteomyelitis of the fourth metatarsal head and base of the fourth proximal phalanx. 2. Bone marrow edema in the third metatarsal head and base of the third proximal phalanx which may be reactive versus osteomyelitis. 3. Severe bone marrow edema in the first proximal phalanx and second proximal phalanx concerning for osteomyelitis. 4. Moderate osteoarthritis of the first MTP joint. Bone marrow edema in the medial and lateral hallux sesamoids likely secondary to arthritis. Electronically Signed   By: Kathreen Devoid M.D.   On: 04/25/2021 08:29   DG Chest Port 1 View  Result Date: 04/21/2021 CLINICAL DATA:  Shortness of breath, pancreatitis, nausea, medical history significant of HTN, COPD, chronic respiratory failure with hypoxia and hypercapnia on 3 L of oxygen at baseline, ESRD on HD(TTS), DM type II, and gangrene with associated osteomyelitis status post fifth ray amputation of the right foot presents  with complaints of 3-4 day of abdominal pain EXAM: PORTABLE CHEST 1 VIEW COMPARISON:  02/28/2021 FINDINGS: Cardiac silhouette is enlarged, but stable. No mediastinal or hilar masses. Linear opacities in the right mid to lower lung. Right lateral hemidiaphragm pleural tenting. This is similar to the prior study, likely atelectasis/pleural scarring.  Mild interstitial thickening. No lung consolidation. No pneumothorax. Skeletal structures grossly intact. IMPRESSION: 1. No acute cardiopulmonary disease. 2. Chronic areas of right lung scarring or atelectasis with associated diaphragmatic base pleural thickening/scarring. Findings stable from the prior chest radiograph. Electronically Signed   By: Lajean Manes M.D.   On: 04/21/2021 15:34   DG Foot 2 Views Right  Result Date: 04/21/2021 CLINICAL DATA:  Nonhealing ulcer right foot EXAM: RIGHT FOOT - 2 VIEW COMPARISON:  10/21/2020 FINDINGS: There is previous surgical removal of distal portion of fifth metatarsal and fifth toe. No definite recent fracture is seen. There is subtle low-density in the head of the fourth metatarsal. No recent fracture or dislocation is seen. Plantar spur is seen in the calcaneus. Extensive arterial calcifications are seen. There is soft tissue swelling along the plantar aspect of forefoot. There is air in the soft tissues along the plantar aspect of forefoot. IMPRESSION: There is faint lucency in the head of the right fourth metatarsal. Possibility of osteomyelitis is not excluded. Follow-up MRI as clinically warranted should be considered. Electronically Signed   By: Elmer Picker M.D.   On: 04/21/2021 14:51   CT PANCREAS ABD W/WO  Result Date: 04/24/2021 CLINICAL DATA:  Follow-up acute pancreatitis. EXAM: CT ABDOMEN WITHOUT AND WITH CONTRAST TECHNIQUE: Multidetector CT imaging of the abdomen was performed following the standard protocol before and following the bolus administration of intravenous contrast. RADIATION DOSE REDUCTION: This exam was performed according to the departmental dose-optimization program which includes automated exposure control, adjustment of the mA and/or kV according to patient size and/or use of iterative reconstruction technique. CONTRAST:  118m OMNIPAQUE IOHEXOL 300 MG/ML  SOLN COMPARISON:  CT scan 04/21/2021 FINDINGS: Lower chest: Stable mild  cardiac enlargement. Significant age advanced aortic and coronary artery calcifications. Persistent area of probable rounded atelectasis at the right lung base and slightly progressive atelectasis at the left lung base. Hepatobiliary: No hepatic lesions or intrahepatic biliary dilatation. Gallbladder is mildly contracted and demonstrates uniform wall thickening. No common bile duct dilatation. Pancreas: Persistent changes of pancreatitis involving the pancreatic head which is edematous and slightly enlarged but normal enhancement without findings for pancreatic necrosis. Spleen: Normal size.  No focal lesions. Adrenals/Urinary Tract: Stable left adrenal gland nodules. No worrisome renal lesions or hydronephrosis. Extensive renal vascular calcifications again demonstrated. Stomach/Bowel: The stomach, duodenum, small bowel and colon are grossly normal. Vascular/Lymphatic: Significant age advanced vascular disease. No mesenteric or retroperitoneal mass or adenopathy. Other: No ascites or abdominal wall hernia. Musculoskeletal: No significant bony findings. IMPRESSION: 1. Persistent changes of pancreatitis involving the pancreatic head which is edematous and slightly enlarged but normal enhancement without findings for pancreatic necrosis. 2. Stable left adrenal gland nodules. 3. Stable area of probable rounded atelectasis at the right lung base and slightly progressive atelectasis at the left lung base. 4. Significant age advanced vascular disease. Aortic Atherosclerosis (ICD10-I70.0). Electronically Signed   By: PMarijo SanesM.D.   On: 04/24/2021 17:43   [place] SITE RITE  Result Date: 04/24/2021 [facility], placement could not be confirmed due to current cardiac rhythm.  [place]dom[place] Limited RUQ (LIVER/GB)  Result Date: 04/21/2021 CLINICAL DATA:  Right  upper quadrant pain for 3 days. EXAM: ULTRASOUND ABDOMEN LIMITED RIGHT UPPER QUADRANT COMPARISON:  None. FINDINGS: Gallbladder: No  gallstones or wall thickening visualized. No sonographic Murphy sign noted by sonographer. Common bile duct: Diameter: 3 mm Liver: No focal lesion identified. Within normal limits in parenchymal echogenicity. Portal vein is patent on color Doppler imaging with normal direction of blood flow towards the liver. Other: None. IMPRESSION: Normal right upper quadrant ultrasound. Electronically Signed   By: Lajean Manes M.D.   On: 04/21/2021 12:51   SLEEP STUDY DOCUMENTS  Result Date: 04/13/2021 Ordered by an unspecified provider.   Labs: Basic Metabolic Panel: Recent Labs  Lab 04/25/21 0625 04/26/21 0616 04/27/21 0535 04/28/21 0650 04/30/21 0503 05/01/21 0531  NA 130* 130* 131* 132* 132* 128*  K 4.9 5.0 4.4 5.1 4.6 5.3*  CL 89* 90* 93* 91* 93* 91*  CO2 '25 24 26 25 24 '$ 21*  GLUCOSE 130* 135* 236* 214* 297* 224*  BUN 39* 57* 46* 60* 57* 66*  CREATININE 8.34* 10.04* 7.96* 9.25* 8.78* 10.09*  CALCIUM 8.8* 9.1 8.8* 9.5 9.5 9.3  PHOS 8.3* 8.2* 5.9* 7.9*  --  8.0*   CBC: Recent Labs  Lab 04/25/21 0625 05/01/21 0923  WBC 6.2 6.4  NEUTROABS 4.5  --   HGB 11.8* 10.1*  HCT 37.9 33.4*  MCV 91.3 92.8  PLT 267 236   Microbiology: ***  Time coordinating discharge: Over 30 minutes  Richarda Osmond, MD  [facility] 05/01/2021, 11:11 AM

## 2021-04-29 ENCOUNTER — Encounter: Payer: Self-pay | Admitting: Podiatry

## 2021-04-29 DIAGNOSIS — M86071 Acute hematogenous osteomyelitis, right ankle and foot: Secondary | ICD-10-CM | POA: Diagnosis not present

## 2021-04-29 DIAGNOSIS — J449 Chronic obstructive pulmonary disease, unspecified: Secondary | ICD-10-CM | POA: Diagnosis not present

## 2021-04-29 DIAGNOSIS — J9611 Chronic respiratory failure with hypoxia: Secondary | ICD-10-CM | POA: Diagnosis not present

## 2021-04-29 DIAGNOSIS — K859 Acute pancreatitis without necrosis or infection, unspecified: Secondary | ICD-10-CM | POA: Diagnosis not present

## 2021-04-29 LAB — GLUCOSE, CAPILLARY
Glucose-Capillary: 279 mg/dL — ABNORMAL HIGH (ref 70–99)
Glucose-Capillary: 248 mg/dL — ABNORMAL HIGH (ref 70–99)
Glucose-Capillary: 210 mg/dL — ABNORMAL HIGH (ref 70–99)
Glucose-Capillary: 286 mg/dL — ABNORMAL HIGH (ref 70–99)

## 2021-04-29 MED ORDER — ANIDULAFUNGIN 100 MG IV SOLR
200.0000 mg | Freq: Once | INTRAVENOUS | Status: AC
  Administered 2021-04-29: 200 mg via INTRAVENOUS
  Filled 2021-04-29: qty 200

## 2021-04-29 MED ORDER — SODIUM CHLORIDE 0.9 % IV SOLN
100.0000 mg | INTRAVENOUS | Status: DC
  Administered 2021-04-30: 100 mg via INTRAVENOUS
  Filled 2021-04-29 (×3): qty 100

## 2021-04-29 MED ORDER — VANCOMYCIN HCL IN DEXTROSE 1-5 GM/200ML-% IV SOLN
1000.0000 mg | Freq: Once | INTRAVENOUS | Status: DC
  Filled 2021-04-29: qty 200

## 2021-04-29 MED ORDER — HYDROMORPHONE HCL 1 MG/ML IJ SOLN: 1.0000 mg | Freq: Two times a day (BID) | INTRAMUSCULAR | Status: DC | PRN

## 2021-04-29 NOTE — Progress Notes (Signed)
[name]  MRN: [id number]  DOB/AGE: May 05, 1970 51 y.o.  Primary Care Physician:[name], NP  Admit date: 04/21/2021  Chief Complaint:  Chief Complaint  Patient presents with   Abdominal Pain   Nausea    S-Pt presented on  04/21/2021 with  Chief Complaint  Patient presents with   Abdominal Pain   Nausea  . Patient is a 51 year old African-American female with a past medical history of end-stage renal disease, patient is on Tuesday Thursday Saturday schedule, diabetes mellitus, history of pancreatitis who came to the ER with chief complaint of abdominal pain.  Patient sitting up in bed Mild abdominal pain remains Tolerating meals  Dialysis yesterday, tolerated well   YQM:VHQIO from the symptoms mentioned above,there are no other symptoms referable to all systems reviewed.  Physical Exam: Vital signs in last 24 hours: Temp:  [97.4 F (36.3 C)-98.2 F (36.8 C)] 97.4 F (36.3 C) (03/05 0911) Pulse Rate:  [78-87] 87 (03/05 0911) Resp:  [12-27] 18 (03/05 0911) BP: (101-147)/(53-99) 120/64 (03/05 0911) SpO2:  [92 %-100 %] 95 % (03/05 0911) Weight:  [120.5 kg-120.6 kg] 120.5 kg (03/04 1352) Weight change:  Last BM Date : 04/29/21  Intake/Output from previous day: 03/04 0701 - 03/05 0700 In: 500 [IV Piggyback:500] Out: 2022  No intake/output data recorded.   Physical Exam:  General- pt is awake,alert, oriented to time place and person  Resp- No acute REsp distress, CTA B/L NO Rhonchi  CVS- S1S2 regular in rate and rhythm  GIT- BS+, soft, minimal tenderness  , Non distended  EXT- trace LE Edema,  No Cyanosis right foot dressing  Access-  LUE AVF  Lab Results:  CBC  No results for input(s): WBC, HGB, HCT, PLT in the last 72 hours.   BMET  Recent Labs    04/27/21 0535 04/28/21 0650  NA 131* 132*  K 4.4 5.1  CL 93* 91*  CO2 26 25  GLUCOSE 236* 214*  BUN 46* 60*  CREATININE 7.96* 9.25*  CALCIUM 8.8* 9.5       Most recent  Creatinine trend  Lab Results  Component Value Date   CREATININE 9.25 (H) 04/28/2021   CREATININE 7.96 (H) 04/27/2021   CREATININE 10.04 (H) 04/26/2021       MICRO   Recent Results (from the past 240 hour(s))  Resp Panel by RT-PCR (Flu A&B, Covid) Nasopharyngeal Swab     Status: None   Collection Time: 04/21/21 12:16 PM   Specimen: Nasopharyngeal Swab; Nasopharyngeal(NP) swabs in vial transport medium  Result Value Ref Range Status   SARS Coronavirus 2 by RT PCR NEGATIVE NEGATIVE Final    Comment: (NOTE) SARS-CoV-2 target nucleic acids are NOT DETECTED.  The SARS-CoV-2 RNA is generally detectable in upper respiratory specimens during the acute phase of infection. The lowest concentration of SARS-CoV-2 viral copies this assay can detect is 138 copies/mL. A negative result does not preclude SARS-Cov-2 infection and should not be used as the sole basis for treatment or other patient management decisions. A negative result may occur with  improper specimen collection/handling, submission of specimen other than nasopharyngeal swab, presence of viral mutation(s) within the areas targeted by this assay, and inadequate number of viral copies(<138 copies/mL). A negative result must be combined with clinical observations, patient history, and epidemiological information. The expected result is Negative.  Fact Sheet for Patients:  EntrepreneurPulse.com.au  Fact Sheet for Healthcare Providers:  IncredibleEmployment.be  This test is no t yet approved or cleared by the [place]  States FDA and  has been authorized for detection and/or diagnosis of SARS-CoV-2 by FDA under an Emergency Use Authorization (EUA). This EUA will remain  in effect (meaning this test can be used) for the duration of the COVID-19 declaration under Section 564(b)(1) of the Act, 21 U.S.C.section 360bbb-3(b)(1), unless the authorization is terminated  or revoked sooner.        Influenza A by PCR NEGATIVE NEGATIVE Final   Influenza B by PCR NEGATIVE NEGATIVE Final    Comment: (NOTE) The Xpert Xpress SARS-CoV-2/FLU/RSV plus assay is intended as an aid in the diagnosis of influenza from Nasopharyngeal swab specimens and should not be used as a sole basis for treatment. Nasal washings and aspirates are unacceptable for Xpert Xpress SARS-CoV-2/FLU/RSV testing.  Fact Sheet for Patients: EntrepreneurPulse.com.au  Fact Sheet for Healthcare Providers: IncredibleEmployment.be  This test is not yet approved or cleared by the [place] FDA and has been authorized for detection and/or diagnosis of SARS-CoV-2 by FDA under an Emergency Use Authorization (EUA). This EUA will remain in effect (meaning this test can be used) for the duration of the COVID-19 declaration under Section 564(b)(1) of the Act, 21 U.S.C. section 360bbb-3(b)(1), unless the authorization is terminated or revoked.  Performed at [facility], [address] [place], [place] [address]   MRSA Next Gen by PCR, Nasal     Status: None   Collection Time: 04/23/21  6:55 AM   Specimen: Nasal Mucosa; Nasal Swab  Result Value Ref Range Status   MRSA by PCR Next Gen NOT DETECTED NOT DETECTED Final    Comment: (NOTE) The GeneXpert MRSA Assay (FDA approved for NASAL specimens only), is one component of a comprehensive MRSA colonization surveillance program. It is not intended to diagnose MRSA infection nor to guide or monitor treatment for MRSA infections. Test performance is not FDA approved in patients less than 72 years old. Performed at [facility], [address] [place], [place] [address]   Aerobic/Anaerobic Culture w Gram Stain (surgical/deep wound)     Status: None (Preliminary result)   Collection Time: 04/27/21  3:39 PM   Specimen: PATH Amputaion Arm/Leg; Tissue  Result Value Ref Range Status   Specimen Description   Final     TISSUE  FOURTH METATARSAL Performed at [facility], [place] [address] [place], [place] [address]    Special Requests   Final    NONE Performed at [facility], [place], [place] [address]    Gram Stain   Final    RARE WBC PRESENT,BOTH PMN AND MONONUCLEAR RARE GRAM NEGATIVE RODS    Culture   Final    NO GROWTH < 24 HOURS Performed at [facility], [place] [address] [place], [place] [address]    Report Status PENDING  Incomplete  Aerobic/Anaerobic Culture w Gram Stain (surgical/deep wound)     Status: None (Preliminary result)   Collection Time: 04/27/21  3:41 PM   Specimen: PATH Amputaion Arm/Leg; Tissue  Result Value Ref Range Status   Specimen Description   Final    TISSUE RIGHT FOOT FOURTH TOE Performed at [facility], [place] [address], [place], [place] [address]    Special Requests   Final    NONE Performed at [facility], [place], [place] [address]    Gram Stain   Final    RARE WBC PRESENT,BOTH PMN AND MONONUCLEAR NO ORGANISMS SEEN    Culture   Final  NO GROWTH < 24 HOURS Performed at [place] [address] [place], [place] [address]    Report Status PENDING  Incomplete     CT abdomen done on April 21, 2021. IMPRESSION: Enlarged pancreatic head with adjacent peripancreatic soft tissue stranding. This is suspicious for focal pancreatitis, although pancreatic carcinoma cannot definitely be excluded. Recommend correlation with serum lipase level, and continued follow-up by pancreatic protocol MRI or CT without and with contrast.   Right-sided pleural thickening and rounded opacity in posterior right lower lobe, new since 2016 exam. Rounded atelectasis is favored, however pulmonary infiltrate or neoplasm cannot definitely be excluded. Recommend clinical correlation, and short-term follow-up with chest CT or PET-CT.   Impression:    Patient is well-known to our practice as  patient undergoes dialysis at [facility] dialysis under care of Dr. Murlean Iba Patient is on Tuesday Thursday Saturday schedule Patient has a target dry weight of 119.5 kg  1)  End-stage renal disease on dialysis.  Received dialysis yesterday, UF goal 2L achieved. At request of GI, prefer less UF removal while recovering from acute illness. Next treatment scheduled for Tuesday. Will monitor discharge plan and if antibiotics are needed with dialysis, will notify outpatient clinic.    2)HTN with chronic kidney disease  Currently only receiving midodrine 20 mg as needed for low blood pressure.   BP remains stable  3)Anemia of chronic disease  CBC Latest Ref Rng & Units 04/25/2021 04/23/2021 04/22/2021  WBC 4.0 - 10.5 K/uL 6.2 6.6 8.9  Hemoglobin 12.0 - 15.0 g/dL 11.8(L) 13.5 12.2  Hematocrit 36.0 - 46.0 % 37.9 44.1 40.4  Platelets 150 - 400 K/uL 267 295 301    Patient did receive Mircera as outpatient Hemoglobin stable  4) Secondary hyperparathyroidism -CKD Mineral-Bone Disorder/Renal failure associated hyperphosphatemia   Lab Results  Component Value Date   PTH 134 (H) 04/21/2017   CALCIUM 9.5 04/28/2021   CAION 0.81 (LL) 10/26/2020   PHOS 7.9 (H) 04/28/2021   Binders held due to pancreatitis.   5)Pancreatitis  Tolerating advanced diet. Continue supportive care    [facility] 04/29/2021, 10:51 AM

## 2021-04-29 NOTE — Progress Notes (Addendum)
?PROGRESS NOTE ? ?[name]    DOB: 10-Mar-1970, 51 y.o.  ?SHF:[id number]  ?  Code Status: Full Code   ?DOA: 04/21/2021   LOS: 8  ? ?Brief hospital course  ?[name] is a 51 y.o. female with a PMH significant for HTN, COPD on 3-4 L nasal cannula chronically, ESRD on HD TTS, type II DM, known osteomyelitis of right foot s/p fifth ray amputation with poorly healing ulcers. ?They presented from home to the ED on 04/21/2021 with epigastric pain, nausea, vomiting x 4 days.  At the same time, she also had increased sputum production with cough so was started on azithromycin but unable to tolerate the full treatment. ?In the ED, it was found that they had pancreatitis and likely COPD exacerbation.  ?They were treated with analgesia, bowel rest, IV fluids.  GI was consulted. ?Imaging suggested osteomyelitis of right foot. ?Additionally, she was mildly hyponatremic and hyperkalemic due to missing several dialysis sessions. ?Patient was admitted to medicine service for further workup and management of pancreatitis, osteomyelitis, electrolyte abnormalities as outlined in detail below. ? ?04/29/21 -stable, improved ? ?Assessment & Plan  ?Principal Problem: ?  Pancreatitis ?Active Problems: ?  Obesity, Class III, BMI 40-49.9 (morbid obesity) ([place]) ?  Type 2 diabetes mellitus with ESRD (end-stage renal disease) ([place]) ?  COPD with chronic bronchitis ([place]) ?  ESRD (end stage renal disease) ([place]) ?  Chronic respiratory failure with hypoxia ([facility]) ?  Diabetic foot ulcer ([place]) ?  Hyponatremia ?  Metabolic acidosis, increased anion gap ?  GERD (gastroesophageal reflux disease) ?  Acute hematogenous osteomyelitis of right foot ([place]) ?  History of osteomyelitis ?  RUQ pain ?  Status post thoracentesis ? ?Pancreatitis- improving. Patient still endorsing epigastric pain but is tolerating soft diet well. Repeat CT abdomen 2/28 showed normal progression of pancreatitis without necrosis. Suspect her pain is more related to her  constipation at this time. ?- GI signed off ?- advance diet as tolerated ?- continue analgesia PRN and taper off as tolerated  ? ?Hyponatremia-  stable- Na+ 130>131>132 ?- management concurrently with HD ?- RFP on HD days ?  ?Diabetic foot ulcer  osteomyelitis- previously diagnosed with gangrene and associated osteomyelitis requiring fifth ray amputation of the foot 09/2020. ?- xray suggestive of osteo> MRI confirmed osteomyelitis ?- patient refuses further amputation. Consulted ID for recommendations in guiding Abx course. She will receive IV Abx at HD for duration of therapy ?- podiatry consulted for biopsy to guide Abx course 3/3 ?- start IV vanc, cefepime per pharmacy 3/4 ?- wound care ? - will need wound car clinic referral at discharge ?- outpatient follow up with podiatry ?  ?Chronic respiratory failure with hypoxia ([place])- chronic, stable ?Chronic respiratory failure on 3 L nasal cannula oxygen 24/7 with O2 saturations currently maintained.  Patient had recently had a sleep study done in the outpatient setting, but has not heard back regarding results.  CT imaging did note concern for right-sided pleural effusion. -Patient is currently at baseline O2 requirements at 3-4L by [place] to maintain saturation of >92% ?-Fluid management with dialysis ?-Follow-up with pulmonology in outpatient setting regarding sleep study ?- bronchodilators PRN ?- completed azithromycin course ?  ?ESRD on HD TThS  hyperkalemia (resolved)-  ?- nephrology managing, appreciate recs ?  ?Type 2 diabetes mellitus with ESRD ([place])- will impact wound healing ?- sSSI ?- semglee daily ? ?Constipation- improving. Had BM yesterday s/p enema ?- continue home bowel regimen ?- limit narcotic pain medications ?-  increase mobility ?  ?GERD ? ?Body mass index is 45.6 kg/m?. ? ?VTE ppx: heparin injection 5,000 Units Start: 04/22/21 0045 ? ? ?Diet:  ?   ?Diet  ? Diet Carb Modified Fluid consistency: Thin; Room service appropriate? Yes  ? ?Subjective  04/29/21   ? ?Pt reports doing well today. She states that she is getting around better. Improvement in her abdominal pain. Able to tolerate diet well. She has many questions/concerns about follow up for antibiotics and her foot wound which were addressed at time of our encounter. ?  ?Objective  ? ?Vitals:  ? 04/28/21 2017 04/28/21 2321 04/29/21 0430 04/29/21 0715  ?BP: (!) 121/56 101/62 133/74   ?Pulse: 86 84 78   ?Resp: '16 18 18   '$ ?Temp: 98 ?F (36.7 ?C) 98.2 ?F (36.8 ?C) 97.8 ?F (36.6 ?C)   ?TempSrc: Oral Oral Oral   ?SpO2: 98% 96% 98% 95%  ?Weight:      ?Height:      ? ? ?Intake/Output Summary (Last 24 hours) at 04/29/2021 0733 ?Last data filed at 04/28/2021 2358 ?Gross per 24 hour  ?Intake 500 ml  ?Output 2022 ml  ?Net -1522 ml  ? ? ?Filed Weights  ? 04/28/21 4854 04/28/21 1247 04/28/21 1352  ?Weight: 122.6 kg 120.6 kg 120.5 kg  ?  ? ?Physical Exam:  ?General: awake, alert, NAD ?HEENT: atraumatic, clear conjunctiva, anicteric sclera, MMM, hearing grossly normal ?Respiratory: normal respiratory effort. ?Gastrointestinal: soft, tender to epigastric area ?Nervous: A&O x3. no gross focal neurologic deficits, normal speech ?Extremities: R foot with packed ulcers on plantar and dorsal surfaces. Poor perfusion. Poor sensation.  ?Skin: dry, intact, normal temperature. Lesions on face ?Psychiatry: normal mood, congruent affect ? ?Labs   ?I have personally reviewed the following labs and imaging studies ?CBC ?   ?Component Value Date/Time  ? WBC 6.2 04/25/2021 0625  ? RBC 4.15 04/25/2021 0625  ? HGB 11.8 (L) 04/25/2021 6270  ? HGB 12.4 01/11/2014 1324  ? HCT 37.9 04/25/2021 0625  ? HCT 39.2 01/11/2014 1324  ? PLT 267 04/25/2021 0625  ? PLT 370 01/11/2014 1324  ? MCV 91.3 04/25/2021 0625  ? MCV 86 01/11/2014 1324  ? MCH 28.4 04/25/2021 0625  ? MCHC 31.1 04/25/2021 0625  ? RDW 14.9 04/25/2021 0625  ? RDW 15.1 (H) 01/11/2014 1324  ? LYMPHSABS 0.9 04/25/2021 0625  ? LYMPHSABS 1.8 01/11/2014 1324  ? MONOABS 0.6 04/25/2021 0625  ?  MONOABS 0.5 01/11/2014 1324  ? EOSABS 0.2 04/25/2021 0625  ? EOSABS 0.2 01/11/2014 1324  ? BASOSABS 0.0 04/25/2021 0625  ? BASOSABS 0.1 01/11/2014 1324  ? ?BMP Latest Ref Rng & Units 04/28/2021 04/27/2021 04/26/2021  ?Glucose 70 - 99 mg/dL 214(H) 236(H) 135(H)  ?BUN 6 - 20 mg/dL 60(H) 46(H) 57(H)  ?Creatinine 0.44 - 1.00 mg/dL 9.25(H) 7.96(H) 10.04(H)  ?Sodium 135 - 145 mmol/L 132(L) 131(L) 130(L)  ?Potassium 3.5 - 5.1 mmol/L 5.1 4.4 5.0  ?Chloride 98 - 111 mmol/L 91(L) 93(L) 90(L)  ?CO2 22 - 32 mmol/L '25 26 24  '$ ?Calcium 8.9 - 10.3 mg/dL 9.5 8.8(L) 9.1  ? ? ?Disposition Plan & Communication  ?Patient status: Inpatient  ?Admitted From: Home ?Planned disposition location: Home ?Anticipated discharge date: 3/6 pending biopsy and antibiotic choice ? ?Family Communication: none  ?  ?Author: ?Richarda Osmond, DO ?[facility] ?04/29/2021, 7:33 AM  ? ?Available by Epic secure chat 7AM-7PM. ?If 7PM-7AM, please contact night-coverage.  ?[facility] contact information found on [email or web address]. ? ?

## 2021-04-30 ENCOUNTER — Ambulatory Visit: Payer: Commercial Managed Care - HMO | Admitting: Pulmonary Disease

## 2021-04-30 DIAGNOSIS — E11621 Type 2 diabetes mellitus with foot ulcer: Secondary | ICD-10-CM | POA: Diagnosis not present

## 2021-04-30 DIAGNOSIS — K8500 Idiopathic acute pancreatitis without necrosis or infection: Secondary | ICD-10-CM

## 2021-04-30 DIAGNOSIS — L97401 Non-pressure chronic ulcer of unspecified heel and midfoot limited to breakdown of skin: Secondary | ICD-10-CM

## 2021-04-30 DIAGNOSIS — J449 Chronic obstructive pulmonary disease, unspecified: Secondary | ICD-10-CM | POA: Diagnosis not present

## 2021-04-30 DIAGNOSIS — J9611 Chronic respiratory failure with hypoxia: Secondary | ICD-10-CM | POA: Diagnosis not present

## 2021-04-30 DIAGNOSIS — E1169 Type 2 diabetes mellitus with other specified complication: Secondary | ICD-10-CM | POA: Diagnosis not present

## 2021-04-30 DIAGNOSIS — K859 Acute pancreatitis without necrosis or infection, unspecified: Secondary | ICD-10-CM | POA: Diagnosis not present

## 2021-04-30 DIAGNOSIS — M86071 Acute hematogenous osteomyelitis, right ankle and foot: Secondary | ICD-10-CM | POA: Diagnosis not present

## 2021-04-30 LAB — BASIC METABOLIC PANEL
Anion gap: 15 (ref 5–15)
BUN: 57 mg/dL — ABNORMAL HIGH (ref 6–20)
CO2: 24 mmol/L (ref 22–32)
Calcium: 9.5 mg/dL (ref 8.9–10.3)
Chloride: 93 mmol/L — ABNORMAL LOW (ref 98–111)
Creatinine, Ser: 8.78 mg/dL — ABNORMAL HIGH (ref 0.44–1.00)
GFR, Estimated: 5 mL/min — ABNORMAL LOW (ref >60)
Glucose, Bld: 297 mg/dL — ABNORMAL HIGH (ref 70–99)
Potassium: 4.6 mmol/L (ref 3.5–5.1)
Sodium: 132 mmol/L — ABNORMAL LOW (ref 135–145)

## 2021-04-30 LAB — GLUCOSE, CAPILLARY
Glucose-Capillary: 275 mg/dL — ABNORMAL HIGH (ref 70–99)
Glucose-Capillary: 298 mg/dL — ABNORMAL HIGH (ref 70–99)
Glucose-Capillary: 206 mg/dL — ABNORMAL HIGH (ref 70–99)
Glucose-Capillary: 279 mg/dL — ABNORMAL HIGH (ref 70–99)
Glucose-Capillary: 204 mg/dL — ABNORMAL HIGH (ref 70–99)

## 2021-04-30 MED ORDER — FLUCONAZOLE 100 MG PO TABS
800.0000 mg | ORAL_TABLET | Freq: Once | ORAL | Status: AC
  Administered 2021-05-01: 800 mg via ORAL
  Filled 2021-04-30: qty 8

## 2021-04-30 MED ORDER — VANCOMYCIN HCL IN DEXTROSE 1-5 GM/200ML-% IV SOLN
1000.0000 mg | INTRAVENOUS | Status: DC
  Administered 2021-05-01: 1000 mg via INTRAVENOUS
  Filled 2021-04-30 (×2): qty 200

## 2021-04-30 MED ORDER — FLUCONAZOLE 100 MG PO TABS: 200.0000 mg | ORAL_TABLET | Freq: Every day | ORAL | Status: DC

## 2021-04-30 NOTE — Progress Notes (Signed)
Inpatient Diabetes Program Recommendations ? ?AACE/ADA: New Consensus Statement on Inpatient Glycemic Control (2015) ? ?Target Ranges:  Prepandial:   less than 140 mg/dL ?     Peak postprandial:   less than 180 mg/dL (1-2 hours) ?     Critically ill patients:  140 - 180 mg/dL  ? ? Latest Reference Range & Units 04/28/21 08:57 04/28/21 14:01 04/28/21 17:21  ?Glucose-Capillary 70 - 99 mg/dL 226 (H) 172 (H) 399 (H)  ?(H): Data is abnormally high ? Latest Reference Range & Units 04/29/21 09:12 04/29/21 12:23 04/29/21 16:17 04/29/21 21:05  ?Glucose-Capillary 70 - 99 mg/dL 279 (H) 248 (H) 210 (H) 286 (H)  ?(H): Data is abnormally high ? Latest Reference Range & Units 04/30/21 07:44  ?Glucose-Capillary 70 - 99 mg/dL 298 (H)  ?(H): Data is abnormally high ? ? ?Home DM Meds: Novolog 0-15 units TID per SSI ?                            Tresiba 43 units QHS ?  ? ?Current Orders: Semglee 18 units QHS ?                          Novolog Moderate Correction Scale/ SSI (0-15 units) TID AC ? ? ?MD- Note pt now tolerating PO diet.  CBGs >200. ? ?Please consider: ? ?1. Increase Semglee to 25 units QHS (takes 43 units Tresiba QHS at home) ? ?2. Start Novolog Meal Coverage: Novolog 4 units TID with meals ?HOLD if pt eats <50% meals ? ? ? ?--Will follow patient during hospitalization-- ? ?Wyn Quaker RN, MSN, CDE ?Diabetes Coordinator ?Inpatient Glycemic Control Team ?Team Pager: [phone] (8a-5p) ? ?

## 2021-04-30 NOTE — Progress Notes (Signed)
?PROGRESS NOTE ? ?[name]    DOB: 11/26/1970, 51 y.o.  ?WUJ:[id number]  ?  Code Status: Full Code   ?DOA: 04/21/2021   LOS: 9  ? ?Brief hospital course  ?[name] is a 51 y.o. female with a PMH significant for HTN, COPD on 3-4 L nasal cannula chronically, ESRD on HD TTS, type II DM, known osteomyelitis of right foot s/p fifth ray amputation with poorly healing ulcers. ?They presented from home to the ED on 04/21/2021 with epigastric pain, nausea, vomiting x 4 days.  At the same time, she also had increased sputum production with cough so was started on azithromycin but unable to tolerate the full treatment. ?In the ED, it was found that they had pancreatitis and likely COPD exacerbation.  ?They were treated with analgesia, bowel rest, IV fluids.  GI was consulted. ?Imaging suggested osteomyelitis of right foot. ?Additionally, she was mildly hyponatremic and hyperkalemic due to missing several dialysis sessions. ?Patient was admitted to medicine service for further workup and management of pancreatitis, osteomyelitis, electrolyte abnormalities as outlined in detail below. ? ?04/30/21 -stable, improved. Planning discharge today but delayed to confirm yeast culture, per ID recommendations ? ?Assessment & Plan  ?Principal Problem: ?  Pancreatitis ?Active Problems: ?  Obesity, Class III, BMI 40-49.9 (morbid obesity) ([place]) ?  Type 2 diabetes mellitus with ESRD (end-stage renal disease) ([place]) ?  COPD with chronic bronchitis ([place]) ?  ESRD (end stage renal disease) ([place]) ?  Chronic respiratory failure with hypoxia ([facility]) ?  Diabetic foot ulcer ([place]) ?  Hyponatremia ?  Metabolic acidosis, increased anion gap ?  GERD (gastroesophageal reflux disease) ?  Acute hematogenous osteomyelitis of right foot ([place]) ?  History of osteomyelitis ?  RUQ pain ?  Status post thoracentesis ? ?Pancreatitis- improving. Patient still endorsing epigastric pain but is tolerating soft diet well. Repeat CT abdomen 2/28 showed normal  progression of pancreatitis without necrosis. Suspect her pain is more related to her constipation at this time. ?- GI signed off ?- continue analgesia PRN and taper off as tolerated  ? ?Hyponatremia-  stable- Na+ 130>131>132>132 ?- management concurrently with HD ?- RFP on HD days ?  ?Diabetic foot ulcer  osteomyelitis- previously diagnosed with gangrene and associated osteomyelitis requiring fifth ray amputation of the foot 09/2020. ?- xray suggestive of osteo> MRI confirmed osteomyelitis ?- patient refuses further amputation. Consulted ID for recommendations in guiding Abx course. She will receive IV Abx at HD for duration of therapy as well as fluconazole for yeast growing on culture.  ?- f/u culture ?- podiatry consulted for biopsy to guide Abx course 3/3 ?- start IV vanc, cefepime per pharmacy 3/4 ?- wound care ? - will need wound car clinic referral at discharge ?- outpatient follow up with podiatry ?  ?Chronic respiratory failure with hypoxia ([place])- chronic, stable ?Chronic respiratory failure on 3 L nasal cannula oxygen 24/7 with O2 saturations currently maintained.  Patient had recently had a sleep study done in the outpatient setting, but has not heard back regarding results.  CT imaging did note concern for right-sided pleural effusion. -Patient is currently at baseline O2 requirements at 3-4L by [place] to maintain saturation of >92% ?-Fluid management with dialysis ?-Follow-up with pulmonology in outpatient setting regarding sleep study ?- bronchodilators PRN ?- completed azithromycin course ?  ?ESRD on HD TThS  hyperkalemia (resolved)-  ?- nephrology managing, appreciate recs ?  ?Type 2 diabetes mellitus with ESRD ([place])- will impact wound healing ?- sSSI ?- semglee  daily ? ?Constipation- improving. Had BM yesterday s/p enema ?- continue home bowel regimen ?- limit narcotic pain medications ?- increase mobility ?  ?GERD ? ?Body mass index is 45.6 kg/m?. ? ?VTE ppx: heparin injection 5,000 Units Start:  04/22/21 0045 ? ? ?Diet:  ?   ?Diet  ? Diet Carb Modified Fluid consistency: Thin; Room service appropriate? Yes  ? ?Subjective 04/30/21   ? ?Pt reports doing well today. Continues to complain of back and abdominal pain. Denies any more Bms.  ?  ?Objective  ? ?Vitals:  ? 04/30/21 0446 04/30/21 0830 04/30/21 1100 04/30/21 1542  ?BP: (!) 148/76 (!) 149/80 (!) 146/92 139/75  ?Pulse: (!) 101 (!) 102 99 100  ?Resp: '16 18 16 19  '$ ?Temp: 98.2 ?F (36.8 ?C) 97.7 ?F (36.5 ?C) 98.2 ?F (36.8 ?C) 98 ?F (36.7 ?C)  ?TempSrc: Oral Oral Oral Oral  ?SpO2: 91% 100% 99% 100%  ?Weight:      ?Height:      ? ? ?Intake/Output Summary (Last 24 hours) at 04/30/2021 1702 ?Last data filed at 04/30/2021 1445 ?Gross per 24 hour  ?Intake 1098.23 ml  ?Output --  ?Net 1098.23 ml  ? ? ?Filed Weights  ? 04/28/21 7824 04/28/21 1247 04/28/21 1352  ?Weight: 122.6 kg 120.6 kg 120.5 kg  ?  ? ?Physical Exam:  ?General: awake, alert, NAD ?HEENT: atraumatic, clear conjunctiva, anicteric sclera, MMM, hearing grossly normal ?Respiratory: normal respiratory effort. ?Gastrointestinal: soft, tender to epigastric area ?Nervous: A&O x3. no gross focal neurologic deficits, normal speech ?Extremities: R foot with packed ulcers on plantar and dorsal surfaces. Poor perfusion. Poor sensation.  ?Skin: dry, intact, normal temperature. Lesions on face ?Psychiatry: normal mood, congruent affect ? ?Labs   ?I have personally reviewed the following labs and imaging studies ?CBC ?   ?Component Value Date/Time  ? WBC 6.2 04/25/2021 0625  ? RBC 4.15 04/25/2021 0625  ? HGB 11.8 (L) 04/25/2021 2353  ? HGB 12.4 01/11/2014 1324  ? HCT 37.9 04/25/2021 0625  ? HCT 39.2 01/11/2014 1324  ? PLT 267 04/25/2021 0625  ? PLT 370 01/11/2014 1324  ? MCV 91.3 04/25/2021 0625  ? MCV 86 01/11/2014 1324  ? MCH 28.4 04/25/2021 0625  ? MCHC 31.1 04/25/2021 0625  ? RDW 14.9 04/25/2021 0625  ? RDW 15.1 (H) 01/11/2014 1324  ? LYMPHSABS 0.9 04/25/2021 0625  ? LYMPHSABS 1.8 01/11/2014 1324  ? MONOABS 0.6  04/25/2021 0625  ? MONOABS 0.5 01/11/2014 1324  ? EOSABS 0.2 04/25/2021 0625  ? EOSABS 0.2 01/11/2014 1324  ? BASOSABS 0.0 04/25/2021 0625  ? BASOSABS 0.1 01/11/2014 1324  ? ?BMP Latest Ref Rng & Units 04/30/2021 04/28/2021 04/27/2021  ?Glucose 70 - 99 mg/dL 297(H) 214(H) 236(H)  ?BUN 6 - 20 mg/dL 57(H) 60(H) 46(H)  ?Creatinine 0.44 - 1.00 mg/dL 8.78(H) 9.25(H) 7.96(H)  ?Sodium 135 - 145 mmol/L 132(L) 132(L) 131(L)  ?Potassium 3.5 - 5.1 mmol/L 4.6 5.1 4.4  ?Chloride 98 - 111 mmol/L 93(L) 91(L) 93(L)  ?CO2 22 - 32 mmol/L '24 25 26  '$ ?Calcium 8.9 - 10.3 mg/dL 9.5 9.5 8.8(L)  ? ? ?Disposition Plan & Communication  ?Patient status: Inpatient  ?Admitted From: Home ?Planned disposition location: Home ?Anticipated discharge date: 3/7 pending biopsy and antibiotic choice ? ?Family Communication: husband at bedside  ?  ?Author: ?Richarda Osmond, DO ?[facility] ?04/30/2021, 5:02 PM  ? ?Available by Epic secure chat 7AM-7PM. ?If 7PM-7AM, please contact night-coverage.  ?[facility] contact information found on [email or web address]. ? ?

## 2021-04-30 NOTE — Progress Notes (Addendum)
PHARMACY CONSULT NOTE FOR: ? ?OUTPATIENT  PARENTERAL ANTIBIOTIC THERAPY (OPAT) ? ?Indication: diabetic foot osteomyelitis ?Regimen:  ?Vancomycin 1gm qHD TTS ?Cefepime 2gm qHD TTS ?Fluconazole '200mg'$  PO daily (take at bedtime so that dose is taken after hemodialysis on days she receives dialysis) ?End date: 06/11/2021  ? ?IV antibiotic discharge orders are pended. ?To discharging provider:  please sign these orders via discharge navigator,  ?Select New Orders & click on the button choice - Manage This Unsigned Work.  ?  ? ?Thank you for allowing pharmacy to be a part of this patient's care. ? ?Doreene Eland, PharmD, BCPS, BCIDP ?Work Cell: [phone] ?04/30/2021 5:11 PM ? ? ? ?

## 2021-04-30 NOTE — Consult Note (Signed)
WOC consulted by bedside nurse for debridement, patient with gangrenous foot. Followed by podiatry. Please follow podiatry guidelines and for changes to care.  ?LE foot wounds of this nature would not be debrided until surgical debridement indicated by treating MD.  ? ? ?Re consult if needed, will not follow at this time. ?Thanks ? Cari Vandeberg [facility] MSN, RN,CWOCN, CNS, CWON-AP [phone])  ?

## 2021-04-30 NOTE — Progress Notes (Signed)
Pharmacy Antibiotic Note ? ?[name] is a 51 y.o. female admitted on 04/21/2021 with osteomyelitis. PMH HTN, COPD on 3-4L, ESRD on HD TTS, T2DM, OM of R foot s/p fifth ray amputation with poorly healing ulcers. Patient prefers conservative therapy with antibiotics over surgery at this time. Pharmacy has been consulted for vancomycin and cefepime dosing. ? ?Xray suggestive and MRI confirmed osteomyelitis. Bone biopsy with cultures performed by podiatry on 3/3.   ? ?Cultures:  ?3/3 4th metatarsal cx: rare yeast (gram stain with rare WBC, rare GNR) ?3/3 R 4th toe: rare yeast (Gram stain rare WBC) ? ? ?Plan:  ?-Cefepime 1 gram every 24 hours ?-Vancomycin 1,000 mg post-HD on HD days (TTS). ?Vancomycin 2000 mg x 1 given 3/4 post HD  ?Monitor clinical course, LOT. Plan to obtain weekly vanc level. ?-Anidulafungin started for yeast in cultures - awaiting identification and ability to change to fluconazole  ? ?Height: '5\' 4"'$  (162.6 cm) ?Weight: 120.5 kg (265 lb 10.5 oz) ?IBW/kg (Calculated) : 54.7 ? ?Temp (24hrs), Avg:98.1 ?F (36.7 ?C), Min:97.7 ?F (36.5 ?C), Max:98.4 ?F (36.9 ?C) ? ?Recent Labs  ?Lab 04/25/21 ?0625 04/26/21 ?6599 04/27/21 ?3570 04/28/21 ?1779 04/30/21 ?0503  ?WBC 6.2  --   --   --   --   ?CREATININE 8.34* 10.04* 7.96* 9.25* 8.78*  ? ?  ?Estimated Creatinine Clearance: 9.9 mL/min (A) (by C-G formula based on SCr of 8.78 mg/dL (H)).   ? ?Allergies  ?Allergen Reactions  ? Dust Mite Extract Shortness Of Breath and Itching  ? Mold Extract [Trichophyton] Shortness Of Breath and Itching  ? Pollen Extract Shortness Of Breath  ?  Itching, shortness of breath, asthma like symptoms  ? Sulfa Antibiotics Itching  ? Feraheme [Ferumoxytol] Itching  ?  Uses benadryl so she can get the medicine  ? ? ?Antimicrobials this admission: ?vancomycin 3/4 >>  ?cefepime 3/4 >>  ? ?Dose adjustments this admission: ?N/a ? ?Microbiology results: ?3/3 Fourth metatarsal path: NG < 24 hours ?3/3 Right foot 4th toe: rare WBC, both PMN  and mononuclear, no orgs ? ?Thank you for allowing pharmacy to be a part of this patient?s care. ? ?Clovis Riley, PharmD ?Pharmacy Resident  ?04/30/2021 ?11:19 AM ? ? ?

## 2021-04-30 NOTE — Progress Notes (Addendum)
[name]  MRN: [id number]  DOB/AGE: 1970/02/28 51 y.o.  Primary Care Physician:[name], NP  Admit date: 04/21/2021  Chief Complaint:  Chief Complaint  Patient presents with   Abdominal Pain   Nausea    S-Pt presented on  04/21/2021 with  Chief Complaint  Patient presents with   Abdominal Pain   Nausea  . Patient is a 51 year old African-American female with a past medical history of end-stage renal disease, patient is on Tuesday Thursday Saturday schedule, diabetes mellitus, history of pancreatitis who came to the ER with chief complaint of abdominal pain.  Patient sitting in bed, eating breakfast Continues to complain of mild abdominal pain, managed with prescribed medications States possible discharge this afternoon   GGE:ZMOQH from the symptoms mentioned above,there are no other symptoms referable to all systems reviewed.  Physical Exam: Vital signs in last 24 hours: Temp:  [97.7 F (36.5 C)-98.4 F (36.9 C)] 98.2 F (36.8 C) (03/06 1100) Pulse Rate:  [99-102] 99 (03/06 1100) Resp:  [16-18] 16 (03/06 1100) BP: (132-149)/(68-92) 146/92 (03/06 1100) SpO2:  [91 %-100 %] 99 % (03/06 1100) Weight change:  Last BM Date : 04/30/21  Intake/Output from previous day: 03/05 0701 - 03/06 0700 In: 618.2 [P.O.:240; I.V.:7.1; IV Piggyback:371.1] Out: -  Total I/O In: 240 [P.O.:240] Out: -    Physical Exam:  General- pt is awake,alert, oriented to time place and person  Resp- No acute REsp distress, CTA B/L NO Rhonchi  CVS- S1S2 regular in rate and rhythm  GIT- BS+, soft, minimal tenderness  , Non distended  EXT- trace LE Edema,  No Cyanosis right foot dressing  Access-  LUE AVF  Lab Results:  CBC  No results for input(s): WBC, HGB, HCT, PLT in the last 72 hours.   BMET  Recent Labs    04/28/21 0650 04/30/21 0503  NA 132* 132*  K 5.1 4.6  CL 91* 93*  CO2 25 24  GLUCOSE 214* 297*  BUN 60* 57*  CREATININE 9.25* 8.78*  CALCIUM 9.5  9.5       Most recent Creatinine trend  Lab Results  Component Value Date   CREATININE 8.78 (H) 04/30/2021   CREATININE 9.25 (H) 04/28/2021   CREATININE 7.96 (H) 04/27/2021       MICRO   Recent Results (from the past 240 hour(s))  Resp Panel by RT-PCR (Flu A&B, Covid) Nasopharyngeal Swab     Status: None   Collection Time: 04/21/21 12:16 PM   Specimen: Nasopharyngeal Swab; Nasopharyngeal(NP) swabs in vial transport medium  Result Value Ref Range Status   SARS Coronavirus 2 by RT PCR NEGATIVE NEGATIVE Final    Comment: (NOTE) SARS-CoV-2 target nucleic acids are NOT DETECTED.  The SARS-CoV-2 RNA is generally detectable in upper respiratory specimens during the acute phase of infection. The lowest concentration of SARS-CoV-2 viral copies this assay can detect is 138 copies/mL. A negative result does not preclude SARS-Cov-2 infection and should not be used as the sole basis for treatment or other patient management decisions. A negative result may occur with  improper specimen collection/handling, submission of specimen other than nasopharyngeal swab, presence of viral mutation(s) within the areas targeted by this assay, and inadequate number of viral copies(<138 copies/mL). A negative result must be combined with clinical observations, patient history, and epidemiological information. The expected result is Negative.  Fact Sheet for Patients:  EntrepreneurPulse.com.au  Fact Sheet for Healthcare Providers:  IncredibleEmployment.be  This test is no t yet approved or cleared  by the [place] and  has been authorized for detection and/or diagnosis of SARS-CoV-2 by FDA under an Emergency Use Authorization (EUA). This EUA will remain  in effect (meaning this test can be used) for the duration of the COVID-19 declaration under Section 564(b)(1) of the Act, 21 U.S.C.section 360bbb-3(b)(1), unless the authorization is terminated  or  revoked sooner.       Influenza A by PCR NEGATIVE NEGATIVE Final   Influenza B by PCR NEGATIVE NEGATIVE Final    Comment: (NOTE) The Xpert Xpress SARS-CoV-2/FLU/RSV plus assay is intended as an aid in the diagnosis of influenza from Nasopharyngeal swab specimens and should not be used as a sole basis for treatment. Nasal washings and aspirates are unacceptable for Xpert Xpress SARS-CoV-2/FLU/RSV testing.  Fact Sheet for Patients: EntrepreneurPulse.com.au  Fact Sheet for Healthcare Providers: IncredibleEmployment.be  This test is not yet approved or cleared by the [place] FDA and has been authorized for detection and/or diagnosis of SARS-CoV-2 by FDA under an Emergency Use Authorization (EUA). This EUA will remain in effect (meaning this test can be used) for the duration of the COVID-19 declaration under Section 564(b)(1) of the Act, 21 U.S.C. section 360bbb-3(b)(1), unless the authorization is terminated or revoked.  Performed at [facility], [address] [place], [place] [address]   MRSA Next Gen by PCR, Nasal     Status: None   Collection Time: 04/23/21  6:55 AM   Specimen: Nasal Mucosa; Nasal Swab  Result Value Ref Range Status   MRSA by PCR Next Gen NOT DETECTED NOT DETECTED Final    Comment: (NOTE) The GeneXpert MRSA Assay (FDA approved for NASAL specimens only), is one component of a comprehensive MRSA colonization surveillance program. It is not intended to diagnose MRSA infection nor to guide or monitor treatment for MRSA infections. Test performance is not FDA approved in patients less than 30 years old. Performed at [facility], [address] [place], [place] [address]   Aerobic/Anaerobic Culture w Gram Stain (surgical/deep wound)     Status: None (Preliminary result)   Collection Time: 04/27/21  3:39 PM   Specimen: PATH Amputaion Arm/Leg; Tissue  Result Value Ref Range Status   Specimen  Description   Final    TISSUE  FOURTH METATARSAL Performed at [facility], [place] [address], [place], [place] [address]    Special Requests   Final    NONE Performed at [facility], [place], [place] [address]    Gram Stain   Final    RARE WBC PRESENT,BOTH PMN AND MONONUCLEAR RARE GRAM NEGATIVE RODS    Culture   Final    RARE YEAST NO ANAEROBES ISOLATED; CULTURE IN PROGRESS FOR 5 DAYS IDENTIFICATION TO FOLLOW Performed at [facility], [place] [address] [place], [place] [address]    Report Status PENDING  Incomplete  Aerobic/Anaerobic Culture w Gram Stain (surgical/deep wound)     Status: None (Preliminary result)   Collection Time: 04/27/21  3:41 PM   Specimen: PATH Amputaion Arm/Leg; Tissue  Result Value Ref Range Status   Specimen Description   Final    TISSUE RIGHT FOOT FOURTH TOE Performed at [facility], [place] [address], [place], [place] [address]    Special Requests   Final    NONE Performed at [facility], [address] [place], [place] [address]    Gram Stain   Final    RARE WBC PRESENT,BOTH PMN  AND MONONUCLEAR NO ORGANISMS SEEN    Culture   Final    RARE YEAST NO ANAEROBES ISOLATED; CULTURE IN PROGRESS FOR 5 DAYS CULTURE REINCUBATED FOR BETTER GROWTH Performed at [facility], [place] [address] [place], [place] [address]    Report Status PENDING  Incomplete     CT abdomen done on April 21, 2021. IMPRESSION: Enlarged pancreatic head with adjacent peripancreatic soft tissue stranding. This is suspicious for focal pancreatitis, although pancreatic carcinoma cannot definitely be excluded. Recommend correlation with serum lipase level, and continued follow-up by pancreatic protocol MRI or CT without and with contrast.   Right-sided pleural thickening and rounded opacity in posterior right lower lobe, new since 2016 exam. Rounded atelectasis is favored, however pulmonary infiltrate or neoplasm  cannot definitely be excluded. Recommend clinical correlation, and short-term follow-up with chest CT or PET-CT.   Impression:  Patient is well-known to our practice as patient undergoes dialysis at [facility] dialysis under care of Dr. Murlean Iba Patient is on Tuesday Thursday Saturday schedule Patient has a target dry weight of 119.5 kg  1)  End-stage renal disease on dialysis.  Scheduled to receive dialysis tomorrow, if remains inpatient.  Patient will require outpatient antibiotics at discharge on dialysis days.  Dialysis coordinator currently assisting with medication arrangements.   2)HTN with chronic kidney disease  Currently only receiving midodrine 20 mg as needed for low blood pressure.   BP stable for this patient  3)Anemia of chronic disease  CBC Latest Ref Rng & Units 04/25/2021 04/23/2021 04/22/2021  WBC 4.0 - 10.5 K/uL 6.2 6.6 8.9  Hemoglobin 12.0 - 15.0 g/dL 11.8(L) 13.5 12.2  Hematocrit 36.0 - 46.0 % 37.9 44.1 40.4  Platelets 150 - 400 K/uL 267 295 301    Patient did receive Mircera as outpatient Hemoglobin within acceptable range   4) Secondary hyperparathyroidism -CKD Mineral-Bone Disorder/Renal failure associated hyperphosphatemia   Lab Results  Component Value Date   PTH 134 (H) 04/21/2017   CALCIUM 9.5 04/30/2021   CAION 0.81 (LL) 10/26/2020   PHOS 7.9 (H) 04/28/2021   due to pancreatitis, patient's binders have been held.due to pancreatitis, patient's binders have been held.  5)Pancreatitis  Tolerating advanced diet. Continue supportive care  6.  Right foot osteomyelitis.  Requiring antibiotics at discharge.  Prescription includes cefepime 2 g daily and vancomycin 1 g with each dialysis treatment with weekly vanc levels until 06/11/21.    Maxton Hendrik Donath 04/30/2021, 1:58 PM

## 2021-04-30 NOTE — Progress Notes (Addendum)
?  [facility]   ? ?Date of Admission:  04/21/2021   Total days of antibiotics 3/vanco/cefepime/anidulafungin ?        ? ? ?ID: [name] is a 51 y.o. female with   ?Principal Problem: ?  Pancreatitis ?Active Problems: ?  Obesity, Class III, BMI 40-49.9 (morbid obesity) ([place]) ?  Type 2 diabetes mellitus with ESRD (end-stage renal disease) ([place]) ?  COPD with chronic bronchitis ([place]) ?  ESRD (end stage renal disease) ([place]) ?  Chronic respiratory failure with hypoxia ([facility]) ?  Diabetic foot ulcer ([place]) ?  Hyponatremia ?  Metabolic acidosis, increased anion gap ?  GERD (gastroesophageal reflux disease) ?  Acute hematogenous osteomyelitis of right foot ([place]) ?  History of osteomyelitis ?  RUQ pain ?  Status post thoracentesis ? ? ? ?Subjective: ?Aefbrile. Feels like her foot is getting better. Not interested in amputation yet ? ?Medications:  ? arformoterol  15 mcg Nebulization Q12H  ? budesonide  0.5 mg Nebulization BID  ? Chlorhexidine Gluconate Cloth  6 each Topical Q0600  ? citalopram  20 mg Oral QHS  ? ferric citrate  210 mg Oral TID WC  ? heparin  5,000 Units Subcutaneous Q8H  ? insulin aspart  0-15 Units Subcutaneous TID WC  ? insulin glargine-yfgn  18 Units Subcutaneous QHS  ? linaclotide  290 mcg Oral q morning  ? loratadine  10 mg Oral QHS  ? multivitamin  1 tablet Oral QHS  ? pantoprazole  40 mg Oral QHS  ? povidone-iodine   Topical Daily  ? revefenacin  175 mcg Nebulization Daily  ? sodium chloride flush  3 mL Intravenous Q12H  ? ? ?Objective: ?Vital signs in last 24 hours: ?Temp:  [97.7 ?F (36.5 ?C)-98.4 ?F (36.9 ?C)] 98.2 ?F (36.8 ?C) (03/06 1100) ?Pulse Rate:  [99-102] 99 (03/06 1100) ?Resp:  [16-18] 16 (03/06 1100) ?BP: (132-149)/(68-92) 146/92 (03/06 1100) ?SpO2:  [91 %-100 %] 99 % (03/06 1100) ? ?Physical Exam  ?Constitutional:  oriented to person, place, and time. appears well-developed and well-nourished. No distress.  ?HENT: [place]/AT, PERRLA, no scleral  icterus ?Mouth/Throat: Oropharynx is clear and moist. No oropharyngeal exudate.  ?Cardiovascular: Normal rate, regular rhythm and normal heart sounds. Exam reveals no gallop and no friction rub.  ?No murmur heard.  ?Pulmonary/Chest: Effort normal and breath sounds normal. No respiratory distress.  has no wheezes.  ?Neck = supple, no nuchal rigidity ?Abdominal: Soft. Bowel sounds are normal.  exhibits no distension. There is no tenderness.  ?Lymphadenopathy: no cervical adenopathy. No axillary adenopathy ?Ext: pics attached in media.soft plantar aspect. Discoloration of distal aspect of foot ?Neurological: alert and oriented to person, place, and time.  ?Skin: Skin is warm and dry. No rash noted. No erythema.  ?Psychiatric: a normal mood and affect.  behavior is normal.  ? ? ?Lab Results ?Recent Labs  ?  04/28/21 ?0650 04/30/21 ?0503  ?NA 132* 132*  ?K 5.1 4.6  ?CL 91* 93*  ?CO2 25 24  ?BUN 60* 57*  ?CREATININE 9.25* 8.78*  ? ?Liver Panel ?Recent Labs  ?  04/28/21 ?0650  ?ALBUMIN 4.1  ? ?Lab Results  ?Component Value Date  ? ESRSEDRATE 77 (H) 02/22/2021  ? ? ? ?Microbiology: ?review ?Studies/Results: ?No results found. ? ? ?Assessment/Plan: ?Polymicrobial dfu/osteo of right foot   continue on vanco and cefepime with HD. Also has anidulafungin daily. Will follow up on culture results to see if can change to oral fluconazole. ? ?Will  need to check sed rate and crp ?------------ ?Addendum = c.parapsilosis isolated on culture. We can do fluconazole instead of anidulafungin and avoid line placement, which we will start tomorrow morning with loading dose of '800mg'$  then she will do daily fluconazole '200mg'$  daily.  ? ?ESRD on HD  getting hd tomorrow and will arrange for outpatient HD antibiotic delivery ? ?----------------- ?Diagnosis: ?Polymicrobial left dfu/gangrenous changes including c.parapsilosis ? ? ? ?Allergies  ?Allergen Reactions  ? Dust Mite Extract Shortness Of Breath and Itching  ? Mold Extract [Trichophyton]  Shortness Of Breath and Itching  ? Pollen Extract Shortness Of Breath  ?  Itching, shortness of breath, asthma like symptoms  ? Sulfa Antibiotics Itching  ? Feraheme [Ferumoxytol] Itching  ?  Uses benadryl so she can get the medicine  ? ? ?OPAT Orders ?Discharge antibiotics to be given via PICC line ?Discharge antibiotics: ?Per pharmacy protocol vanco and cefepime per hd. Plus oral fluconazole '200mg'$  oral daily ? ?Duration: ?6 wk ?End Date: ?April 17th ? ? ?Clinic Follow Up Appt: ?In 6 wk with dr Delaine Lame ? ? ? ?Carlyle Basques ?[facility] ?Pager: [phone] ? ?04/30/2021, 3:12 PM ? ? ? ? ? ?

## 2021-05-01 DIAGNOSIS — R0602 Shortness of breath: Secondary | ICD-10-CM

## 2021-05-01 DIAGNOSIS — K859 Acute pancreatitis without necrosis or infection, unspecified: Secondary | ICD-10-CM | POA: Diagnosis not present

## 2021-05-01 DIAGNOSIS — M86071 Acute hematogenous osteomyelitis, right ankle and foot: Secondary | ICD-10-CM | POA: Diagnosis not present

## 2021-05-01 DIAGNOSIS — J449 Chronic obstructive pulmonary disease, unspecified: Secondary | ICD-10-CM | POA: Diagnosis not present

## 2021-05-01 DIAGNOSIS — J9611 Chronic respiratory failure with hypoxia: Secondary | ICD-10-CM | POA: Diagnosis not present

## 2021-05-01 LAB — CBC
HCT: 33.4 % — ABNORMAL LOW (ref 36.0–46.0)
Hemoglobin: 10.1 g/dL — ABNORMAL LOW (ref 12.0–15.0)
MCH: 28.1 pg (ref 26.0–34.0)
MCHC: 30.2 g/dL (ref 30.0–36.0)
MCV: 92.8 fL (ref 80.0–100.0)
Platelets: 236 10*3/uL (ref 150–400)
RBC: 3.6 MIL/uL — ABNORMAL LOW (ref 3.87–5.11)
RDW: 13.6 % (ref 11.5–15.5)
WBC: 6.4 10*3/uL (ref 4.0–10.5)
nRBC: 0 % (ref 0.0–0.2)

## 2021-05-01 LAB — RENAL FUNCTION PANEL
Albumin: 3.3 g/dL — ABNORMAL LOW (ref 3.5–5.0)
Anion gap: 16 — ABNORMAL HIGH (ref 5–15)
BUN: 66 mg/dL — ABNORMAL HIGH (ref 6–20)
CO2: 21 mmol/L — ABNORMAL LOW (ref 22–32)
Calcium: 9.3 mg/dL (ref 8.9–10.3)
Chloride: 91 mmol/L — ABNORMAL LOW (ref 98–111)
Creatinine, Ser: 10.09 mg/dL — ABNORMAL HIGH (ref 0.44–1.00)
GFR, Estimated: 4 mL/min — ABNORMAL LOW (ref >60)
Glucose, Bld: 224 mg/dL — ABNORMAL HIGH (ref 70–99)
Phosphorus: 8 mg/dL — ABNORMAL HIGH (ref 2.5–4.6)
Potassium: 5.3 mmol/L — ABNORMAL HIGH (ref 3.5–5.1)
Sodium: 128 mmol/L — ABNORMAL LOW (ref 135–145)

## 2021-05-01 LAB — HEPATIC FUNCTION PANEL
ALT: 18 U/L (ref 0–44)
AST: 23 U/L (ref 15–41)
Albumin: 3.4 g/dL — ABNORMAL LOW (ref 3.5–5.0)
Alkaline Phosphatase: 243 U/L — ABNORMAL HIGH (ref 38–126)
Bilirubin, Direct: 0.1 mg/dL (ref 0.0–0.2)
Indirect Bilirubin: 0.6 mg/dL (ref 0.3–0.9)
Total Bilirubin: 0.7 mg/dL (ref 0.3–1.2)
Total Protein: 7.2 g/dL (ref 6.5–8.1)

## 2021-05-01 LAB — GLUCOSE, CAPILLARY
Glucose-Capillary: 253 mg/dL — ABNORMAL HIGH (ref 70–99)
Glucose-Capillary: 201 mg/dL — ABNORMAL HIGH (ref 70–99)
Glucose-Capillary: 218 mg/dL — ABNORMAL HIGH (ref 70–99)

## 2021-05-01 LAB — SURGICAL PATHOLOGY

## 2021-05-01 MED ORDER — SODIUM CHLORIDE 0.9 % IV SOLN
2.0000 g | INTRAVENOUS | Status: DC
  Administered 2021-05-01: 2 g via INTRAVENOUS
  Filled 2021-05-01 (×2): qty 2

## 2021-05-01 MED ORDER — CEFEPIME IV (FOR PTA / DISCHARGE USE ONLY): 2.0000 g | INTRAVENOUS | Status: DC

## 2021-05-01 MED ORDER — VANCOMYCIN HCL IN DEXTROSE 1-5 GM/200ML-% IV SOLN: 1000.0000 mg | INTRAVENOUS | Status: DC

## 2021-05-01 MED ORDER — FLUCONAZOLE 200 MG PO TABS: 200.0000 mg | ORAL_TABLET | Freq: Every day | ORAL | 0 refills | Status: AC

## 2021-05-01 MED ORDER — MIDODRINE HCL 10 MG PO TABS: 20.0000 mg | ORAL_TABLET | Freq: Every day | ORAL | 0 refills | Status: AC | PRN

## 2021-05-01 MED ORDER — OXYCODONE-ACETAMINOPHEN 5-325 MG PO TABS: 1.0000 | ORAL_TABLET | Freq: Two times a day (BID) | ORAL | 0 refills | Status: AC | PRN

## 2021-05-01 MED ORDER — INSULIN DEGLUDEC 100 UNIT/ML ~~LOC~~ SOPN: 30.0000 [IU] | PEN_INJECTOR | Freq: Every day | SUBCUTANEOUS | Status: DC

## 2021-05-01 MED ORDER — VANCOMYCIN IV (FOR PTA / DISCHARGE USE ONLY): 1000.0000 mg | INTRAVENOUS | 0 refills | Status: AC

## 2021-05-01 MED ORDER — SODIUM CHLORIDE 0.9 % IV SOLN: 2.0000 g | INTRAVENOUS | Status: DC

## 2021-05-01 NOTE — Discharge Instructions (Signed)
I'm glad that you are doing better.  ?We made several changes to your medications ?- started vancomycin and cefepime which will be given at dialysis ?- fluconazole sent to your pharmacy and you will take every day in the evening ?- I moved your daily insulin to the mornings and decreased the dose because you didn't require as much insulin while inpatient and it is safer to take it in the morning when you will be awake and eating. Please follow up with your primary doctor to monitor if the dose needs to go back up once eating a fully normal diet ?- lots of follow ups planned. If you don't hear from them, then reach out to them... pulmonology, primary doctor, wound clinic, podiatry, GI, infectious disease, and kidney doctor ?

## 2021-05-01 NOTE — Progress Notes (Signed)
Patient completes 3 hour hemodialysis treatment, RUA AVF cannulates with ease, maintains BFR., minimal post treatment bleeding. Received antibiotic as ordered. Targeted UF met, with 2-liter fluid removal. Patient anticipates discharge to home.  ?

## 2021-05-01 NOTE — Progress Notes (Signed)
[name] to be D/C'd Home per MD order.  Discussed prescriptions and follow up appointments with the patient. Prescriptions given to patient, medication list explained in detail. Pt verbalized understanding. ? ?Allergies as of 05/01/2021   ? ?   Reactions  ? Dust Mite Extract Shortness Of Breath, Itching  ? Mold Extract [trichophyton] Shortness Of Breath, Itching  ? Pollen Extract Shortness Of Breath  ? Itching, shortness of breath, asthma like symptoms  ? Sulfa Antibiotics Itching  ? Feraheme [ferumoxytol] Itching  ? Uses benadryl so she can get the medicine  ? ?  ? ?  ?Medication List  ?  ? ?STOP taking these medications   ? ?azithromycin 250 MG tablet ?Commonly known as: ZITHROMAX ?  ?docusate sodium 100 MG capsule ?Commonly known as: COLACE ?  ?metoprolol tartrate 25 MG tablet ?Commonly known as: LOPRESSOR ?  ? ?  ? ?TAKE these medications   ? ?acetaminophen 500 MG tablet ?Commonly known as: TYLENOL ?Take 1,000 mg by mouth every 6 (six) hours as needed for mild pain, fever or headache. ?  ?albuterol (2.5 MG/3ML) 0.083% nebulizer solution ?Commonly known as: PROVENTIL ?Take 3 mLs (2.5 mg total) by nebulization every 6 (six) hours as needed for wheezing or shortness of breath. ?  ?albuterol 108 (90 Base) MCG/ACT inhaler ?Commonly known as: VENTOLIN HFA ?INHALE 2 PUFFS BY MOUTH EVERY 4 HOURS AS NEEDED FOR WHEEZING FOR SHORTNESS OF BREATH ?  ?aspirin EC 81 MG tablet ?Take 81 mg by mouth daily. ?  ?b complex-vitamin c-folic acid 0.8 MG Tabs tablet ?Take 1 tablet by mouth at bedtime. ?  ?budesonide 0.25 MG/2ML nebulizer solution ?Commonly known as: PULMICORT ?Take 2 mLs (0.25 mg total) by nebulization 2 (two) times daily. ?  ?budesonide 0.5 MG/2ML nebulizer solution ?Commonly known as: PULMICORT ?Take by nebulization. ?  ?ceFEPime  IVPB ?Commonly known as: MAXIPIME ?Inject 2 g into the vein Every Tuesday,Thursday,and Saturday with dialysis. Cefepime 2gm with hemodialysis on Tue, Thur, Sat ?Indication: diabetic  foot infection ?Last Day of Therapy:  06/11/2021 ?Labs - Once weekly:  CBC/D, CMP, and vancomycin trough ?Start taking on: May 03, 2021 ?  ?ceFEPIme 2 g in sodium chloride 0.9 % 100 mL ?Inject 2 g into the vein Every Tuesday,Thursday,and Saturday with dialysis. ?  ?Cholecalciferol 100 MCG (4000 UT) Caps ?Take 1 capsule by mouth once a week. ?  ?citalopram 20 MG tablet ?Commonly known as: CELEXA ?Take 20 mg by mouth at bedtime. ?  ?clindamycin 1 % lotion ?Commonly known as: CLEOCIN T ?Apply 1 application topically daily. ?  ?CVS Natural Fish Oil 1000 MG Caps ?2 mL 2 TIMES DAILY (route: inhalation) ?  ?ferric citrate 1 GM 210 MG(Fe) tablet ?Commonly known as: AURYXIA ?Take 210 mg by mouth 3 (three) times daily with meals. ?  ?fluconazole 200 MG tablet ?Commonly known as: DIFLUCAN ?Take 1 tablet (200 mg total) by mouth at bedtime. ?Start taking on: May 02, 2021 ?  ?formoterol 20 MCG/2ML nebulizer solution ?Commonly known as: PERFOROMIST ?  ?insulin aspart 100 UNIT/ML injection ?Commonly known as: novoLOG ?Inject 0-15 Units into the skin 3 (three) times daily with meals. ?What changed: Another medication with the same name was removed. Continue taking this medication, and follow the directions you see here. ?  ?insulin degludec 100 UNIT/ML FlexTouch Pen ?Commonly known as: TRESIBA ?Inject 30 Units into the skin daily. ?What changed:  ?how much to take ?when to take this ?  ?lactulose 10 GM/15ML solution ?Commonly known as:  [place] ?Take 30 mLs (20 g total) by mouth daily as needed for severe constipation. ?  ?levocetirizine 5 MG tablet ?Commonly known as: XYZAL ?Take 5 mg by mouth at bedtime. ?  ?lidocaine-prilocaine cream ?Commonly known as: EMLA ?Apply topically as needed (For access for dialysis). ?  ?Linzess 290 MCG Caps capsule ?Generic drug: linaclotide ?Take 290 mcg by mouth every morning. ?  ?midodrine 10 MG tablet ?Commonly known as: PROAMATINE ?Take 2 tablets (20 mg total) by mouth daily as needed (low  blood pressure). ?What changed: reasons to take this ?  ?omeprazole 20 MG capsule ?Commonly known as: PRILOSEC ?Take 20 mg by mouth daily. ?  ?oxyCODONE-acetaminophen 5-325 MG tablet ?Commonly known as: Percocet ?Take 1 tablet by mouth every 4 (four) hours as needed for severe pain. ?What changed: Another medication with the same name was added. Make sure you understand how and when to take each. ?  ?oxyCODONE-acetaminophen 5-325 MG tablet ?Commonly known as: Percocet ?Take 1 tablet by mouth 2 (two) times daily as needed for up to 6 days for severe pain. ?What changed: You were already taking a medication with the same name, and this prescription was added. Make sure you understand how and when to take each. ?  ?OXYGEN ?Inhale 2-4 L into the lungs. ?  ?vancomycin  IVPB ?Inject 1,000 mg into the vein Every Tuesday,Thursday,and Saturday with dialysis. Vancomycin 1gm with hemodialysis on Tue, Thur, Sat ?Indication: diabetic foot infection ?Last Day of Therapy:  06/11/2021 ?Labs - Once weekly:  CBC/D, CMP, and vancomycin trough ?Start taking on: May 03, 2021 ?  ?vancomycin 1-5 GM/200ML-% Soln ?Commonly known as: VANCOCIN ?Inject 200 mLs (1,000 mg total) into the vein Every Tuesday,Thursday,and Saturday with dialysis. ?  ?Yupelri 175 MCG/3ML nebulizer solution ?Generic drug: revefenacin ?  ? ?  ? ?  ?  ? ? ?  ?Home Infusion Instuctions  ?(From admission, onward)  ?  ? ? ?  ? ?  Start     Ordered  ? 05/01/21 0000  Home infusion instructions       ?Question:  Instructions  Answer:  Flushing of vascular access device: 0.9% NaCl pre/post medication administration and prn patency; Heparin 100 u/ml, 63m for implanted ports and Heparin 10u/ml, 551mfor all other central venous catheters.  ? 05/01/21 1433  ? ?  ?  ? ?  ? ? ?Vitals:  ? 05/01/21 1218 05/01/21 1422  ?BP:  (!) 143/87  ?Pulse: 87 94  ?Resp: (!) 21 18  ?Temp: 98.6 ?F (37 ?C) 98 ?F (36.7 ?C)  ?SpO2: 97% 98%  ? ? ?Skin clean, dry and intact without evidence of skin break  down, no evidence of skin tears noted. IV catheter discontinued intact. Site without signs and symptoms of complications. Dressing and pressure applied. Pt denies pain at this time. No complaints noted. ? ?An After Visit Summary was printed and given to the patient. ?Patient escorted via [facility] D/C home via private auto. ? ?ErRolley Sims?

## 2021-05-01 NOTE — Care Management Important Message (Signed)
Important Message ? ?Patient Details  ?Name: [name] ?MRN: [id number] ?Date of Birth: 1970-09-22 ? ? ?Medicare Important Message Given:  Yes ? ? ? ? ?Juliann Pulse A Jadeyn Hargett ?05/01/2021, 12:12 PM ?

## 2021-05-01 NOTE — Progress Notes (Signed)
[name] ? ?MRN: [id number] ? ?DOB/AGE: [birth year] 51 y.o. ? ?Primary Care Physician:[name], NP ? ?Admit date: 04/21/2021 ? ?Chief Complaint:  ?Chief Complaint  ?Patient presents with  ? Abdominal Pain  ? Nausea  ? ? ?S-Pt presented on  04/21/2021 with  ?Chief Complaint  ?Patient presents with  ? Abdominal Pain  ? Nausea  ?[name] ?Patient is a 51 year old African-American female with a past medical history of end-stage renal disease, patient is on Tuesday Thursday Saturday schedule, diabetes mellitus, history of pancreatitis who came to the ER with chief complaint of abdominal pain. ? ?Patient seen and evaluated during dialysis ?  ?HEMODIALYSIS FLOWSHEET: ? ?Blood Flow Rate (mL/min): 400 mL/min ?Arterial Pressure (mmHg): -240 mmHg ?Venous Pressure (mmHg): 240 mmHg ?Transmembrane Pressure (mmHg): 60 mmHg ?Ultrafiltration Rate (mL/min): 830 mL/min ?Dialysate Flow Rate (mL/min): 500 ml/min ?Conductivity: Machine : 13.7 ?Conductivity: Machine : 13.7 ?Dialysis Fluid Bolus: Normal Saline ?Bolus Amount (mL): 250 mL ? ?No complaints at this time ? ? ?YKZ:LDJTT from the symptoms mentioned above,there are no other symptoms referable to all systems reviewed. ? ?Physical Exam: ?Vital signs in last 24 hours: ?Temp:  [97.6 ?F (36.4 ?C)-98.6 ?F (37 ?C)] 98.6 ?F (37 ?C) (03/07 1218) ?Pulse Rate:  [81-100] 87 (03/07 1218) ?Resp:  [14-28] 21 (03/07 1218) ?BP: (91-168)/(62-94) 132/68 (03/07 1215) ?SpO2:  [96 %-100 %] 97 % (03/07 1218) ?Weight:  [124.1 kg-125.3 kg] 124.1 kg (03/07 1218) ?Weight change:  ?Last BM Date : 05/01/21 ? ?Intake/Output from previous day: ?03/06 0701 - 03/07 0700 ?In: 480 [P.O.:480] ?Out: -  ?Total I/O ?In: -  ?Out: 2000 [Other:2000] ? ? ?Physical Exam: ? ?General- pt is awake,alert, oriented to time place and person ? ?Resp- No acute REsp distress, CTA B/L NO Rhonchi ? ?CVS- S1S2 regular in rate and rhythm ? ?GIT- BS+, soft, minimal tenderness  , Non distended ? ?EXT- trace LE Edema,  No Cyanosis right  foot dressing ? ?Access-  LUE AVF ? ?Lab Results: ? ?CBC ? ?Recent Labs  ?  05/01/21 ?0923  ?WBC 6.4  ?HGB 10.1*  ?HCT 33.4*  ?PLT 236  ? ? ? ?BMET ? ?Recent Labs  ?  04/30/21 ?0503 05/01/21 ?0177  ?NA 132* 128*  ?K 4.6 5.3*  ?CL 93* 91*  ?CO2 24 21*  ?GLUCOSE 297* 224*  ?BUN 57* 66*  ?CREATININE 8.78* 10.09*  ?CALCIUM 9.5 9.3  ? ? ? ? ? ?Most recent Creatinine trend  ?Lab Results  ?Component Value Date  ? CREATININE 10.09 (H) 05/01/2021  ? CREATININE 8.78 (H) 04/30/2021  ? CREATININE 9.25 (H) 04/28/2021  ? ?  ? ? ?MICRO ? ? ?Recent Results (from the past 240 hour(s))  ?MRSA Next Gen by PCR, Nasal     Status: None  ? Collection Time: 04/23/21  6:55 AM  ? Specimen: Nasal Mucosa; Nasal Swab  ?Result Value Ref Range Status  ? MRSA by PCR Next Gen NOT DETECTED NOT DETECTED Final  ?  Comment: (NOTE) ?The GeneXpert MRSA Assay (FDA approved for NASAL specimens only), ?is one component of a comprehensive MRSA colonization surveillance ?program. It is not intended to diagnose MRSA infection nor to guide ?or monitor treatment for MRSA infections. ?Test performance is not FDA approved in patients less than 2 years ?old. ?Performed at [facility], [place], ?[place] [address] ?  ?Aerobic/Anaerobic Culture w Gram Stain (surgical/deep wound)     Status: None (Preliminary result)  ? Collection Time: 04/27/21  3:39 PM  ?  Specimen: PATH Amputaion Arm/Leg; Tissue  ?Result Value Ref Range Status  ? Specimen Description   Final  ?  TISSUE  FOURTH METATARSAL ?Performed at [facility], [place] [address], [place], [place] [address] ?  ? Special Requests   Final  ?  NONE ?Performed at [facility], [address], [place], [place] [address] ?  ? Gram Stain   Final  ?  RARE WBC PRESENT,BOTH PMN AND MONONUCLEAR ?RARE GRAM NEGATIVE RODS ?Performed at [facility], [place] [address] [place], [place] [address] ?  ? Culture   Final  ?  RARE CANDIDA PARAPSILOSIS ?NO ANAEROBES ISOLATED; CULTURE IN  PROGRESS FOR 5 DAYS ?  ? Report Status PENDING  Incomplete  ?Aerobic/Anaerobic Culture w Gram Stain (surgical/deep wound)     Status: None (Preliminary result)  ? Collection Time: 04/27/21  3:41 PM  ? Specimen: PATH Amputaion Arm/Leg; Tissue  ?Result Value Ref Range Status  ? Specimen Description   Final  ?  TISSUE RIGHT FOOT FOURTH TOE ?Performed at [facility], [place] [address], [place], [place] [address] ?  ? Special Requests   Final  ?  NONE ?Performed at [facility], [address] [place], [place] [address] ?  ? Gram Stain   Final  ?  RARE WBC PRESENT,BOTH PMN AND MONONUCLEAR ?NO ORGANISMS SEEN ?Performed at [facility], [place] [address], [place], [place] [address] ?  ? Culture   Final  ?  RARE CANDIDA PARAPSILOSIS ?NO ANAEROBES ISOLATED; CULTURE IN PROGRESS FOR 5 DAYS ?  ? Report Status PENDING  Incomplete  ?  ? ?CT abdomen done on April 21, 2021. ?IMPRESSION: ?Enlarged pancreatic head with adjacent peripancreatic soft tissue ?stranding. This is suspicious for focal pancreatitis, although ?pancreatic carcinoma cannot definitely be excluded. Recommend ?correlation with serum lipase level, and continued follow-up by ?pancreatic protocol MRI or CT without and with contrast. ?  ?Right-sided pleural thickening and rounded opacity in posterior ?right lower lobe, new since 2016 exam. Rounded atelectasis is ?favored, however pulmonary infiltrate or neoplasm cannot definitely ?be excluded. Recommend clinical correlation, and short-term ?follow-up with chest CT or PET-CT. ? ? ?Impression: ? ?Patient is well-known to our practice as patient undergoes dialysis at [facility] dialysis under care of Dr. Murlean Iba ?Patient is on Tuesday Thursday Saturday schedule ?Patient has a target dry weight of 119.5 kg ? ?1)  End-stage renal disease on dialysis. ? Currently receiving dialysis, UF goal 2L as tolerated ? Next treatment scheduled for Thursday. Outpatient clinic aware of need for  antibiotics at discharge.  ? ? ?2)HTN with chronic kidney disease ? Currently only receiving midodrine 20 mg as needed for low blood pressure.  ? BP 132/68 during dialysis ? ?3)Anemia of chronic disease ? ?CBC Latest Ref Rng & Units 05/01/2021 04/25/2021 04/23/2021  ?WBC 4.0 - 10.5 K/uL 6.4 6.2 6.6  ?Hemoglobin 12.0 - 15.0 g/dL 10.1(L) 11.8(L) 13.5  ?Hematocrit 36.0 - 46.0 % 33.4(L) 37.9 44.1  ?Platelets 150 - 400 K/uL 236 267 295  ?  ?Patient did receive Mircera as outpatient ?Hemoglobin at goal ? ?4) Secondary hyperparathyroidism -CKD Mineral-Bone Disorder/Renal failure associated hyperphosphatemia  ? ?Lab Results  ?Component Value Date  ? PTH 134 (H) 04/21/2017  ? CALCIUM 9.3 05/01/2021  ? CAION 0.81 (LL) 10/26/2020  ? PHOS 8.0 (H) 05/01/2021  ? ?due to pancreatitis, patient's binders have been held.due to pancreatitis ? ?5)Pancreatitis ? Tolerating advanced diet. Continue supportive care ? ?6.  Right foot osteomyelitis.  Requiring antibiotics at discharge.  Prescription includes cefepime 2 g daily and vancomycin 1 g with each dialysis treatment with weekly vanc levels until 06/11/21. ? ? ? ?Morgan ?05/01/2021, 1:20 PM  ?

## 2021-05-01 NOTE — Progress Notes (Signed)
Inpatient Diabetes Program Recommendations ? ?AACE/ADA: New Consensus Statement on Inpatient Glycemic Control (2015) ? ?Target Ranges:  Prepandial:   less than 140 mg/dL ?     Peak postprandial:   less than 180 mg/dL (1-2 hours) ?     Critically ill patients:  140 - 180 mg/dL  ? ? Latest Reference Range & Units 04/30/21 07:44 04/30/21 11:56 04/30/21 16:19 04/30/21 22:14  ?Glucose-Capillary 70 - 99 mg/dL 298 (H) 206 (H) 279 (H) 204 (H)  ? ? Latest Reference Range & Units 05/01/21 07:49  ?Glucose-Capillary 70 - 99 mg/dL 201 (H)  ?(H): Data is abnormally high ? ? ?Home DM Meds: Novolog 0-15 units TID per SSI ?                            Tresiba 43 units QHS ?  ?  ?Current Orders: Semglee 18 units QHS ?                          Novolog Moderate Correction Scale/ SSI (0-15 units) TID AC ?  ?  ?MD- Note pt now tolerating PO diet.  CBGs >200. ?  ?Please consider: ?  ?1. Increase Semglee to 25 units QHS (takes 43 units Tresiba QHS at home) ?  ?2. Start Novolog Meal Coverage: Novolog 4 units TID with meals ?HOLD if pt eats <50% meals ?  ?  ?  ?--Will follow patient during hospitalization-- ?  ?Wyn Quaker RN, MSN, CDE ?Diabetes Coordinator ?Inpatient Glycemic Control Team ?Team Pager: [phone] (8a-5p) ? ?

## 2021-05-02 ENCOUNTER — Encounter: Payer: Self-pay | Admitting: Oncology

## 2021-05-02 DIAGNOSIS — E1122 Type 2 diabetes mellitus with diabetic chronic kidney disease: Secondary | ICD-10-CM | POA: Diagnosis not present

## 2021-05-02 DIAGNOSIS — K219 Gastro-esophageal reflux disease without esophagitis: Secondary | ICD-10-CM | POA: Diagnosis not present

## 2021-05-02 DIAGNOSIS — E11621 Type 2 diabetes mellitus with foot ulcer: Secondary | ICD-10-CM | POA: Diagnosis not present

## 2021-05-02 DIAGNOSIS — E1169 Type 2 diabetes mellitus with other specified complication: Secondary | ICD-10-CM | POA: Diagnosis not present

## 2021-05-02 DIAGNOSIS — Z9981 Dependence on supplemental oxygen: Secondary | ICD-10-CM | POA: Diagnosis not present

## 2021-05-02 DIAGNOSIS — M86071 Acute hematogenous osteomyelitis, right ankle and foot: Secondary | ICD-10-CM | POA: Diagnosis not present

## 2021-05-02 DIAGNOSIS — Z992 Dependence on renal dialysis: Secondary | ICD-10-CM | POA: Diagnosis not present

## 2021-05-02 DIAGNOSIS — J9811 Atelectasis: Secondary | ICD-10-CM | POA: Diagnosis not present

## 2021-05-02 DIAGNOSIS — Z792 Long term (current) use of antibiotics: Secondary | ICD-10-CM | POA: Diagnosis not present

## 2021-05-02 DIAGNOSIS — N186 End stage renal disease: Secondary | ICD-10-CM | POA: Diagnosis not present

## 2021-05-02 DIAGNOSIS — Z794 Long term (current) use of insulin: Secondary | ICD-10-CM | POA: Diagnosis not present

## 2021-05-02 DIAGNOSIS — K859 Acute pancreatitis without necrosis or infection, unspecified: Secondary | ICD-10-CM | POA: Diagnosis not present

## 2021-05-02 DIAGNOSIS — I5032 Chronic diastolic (congestive) heart failure: Secondary | ICD-10-CM | POA: Diagnosis not present

## 2021-05-02 DIAGNOSIS — J9611 Chronic respiratory failure with hypoxia: Secondary | ICD-10-CM | POA: Diagnosis not present

## 2021-05-02 DIAGNOSIS — Z79899 Other long term (current) drug therapy: Secondary | ICD-10-CM | POA: Diagnosis not present

## 2021-05-02 DIAGNOSIS — D631 Anemia in chronic kidney disease: Secondary | ICD-10-CM | POA: Diagnosis not present

## 2021-05-02 DIAGNOSIS — J441 Chronic obstructive pulmonary disease with (acute) exacerbation: Secondary | ICD-10-CM | POA: Diagnosis not present

## 2021-05-02 DIAGNOSIS — L97512 Non-pressure chronic ulcer of other part of right foot with fat layer exposed: Secondary | ICD-10-CM | POA: Diagnosis not present

## 2021-05-02 DIAGNOSIS — I132 Hypertensive heart and chronic kidney disease with heart failure and with stage 5 chronic kidney disease, or end stage renal disease: Secondary | ICD-10-CM | POA: Diagnosis not present

## 2021-05-02 DIAGNOSIS — E1152 Type 2 diabetes mellitus with diabetic peripheral angiopathy with gangrene: Secondary | ICD-10-CM | POA: Diagnosis not present

## 2021-05-03 DIAGNOSIS — N186 End stage renal disease: Secondary | ICD-10-CM | POA: Diagnosis not present

## 2021-05-03 DIAGNOSIS — L97509 Non-pressure chronic ulcer of other part of unspecified foot with unspecified severity: Secondary | ICD-10-CM | POA: Diagnosis not present

## 2021-05-03 DIAGNOSIS — Z992 Dependence on renal dialysis: Secondary | ICD-10-CM | POA: Diagnosis not present

## 2021-05-03 DIAGNOSIS — L989 Disorder of the skin and subcutaneous tissue, unspecified: Secondary | ICD-10-CM | POA: Diagnosis not present

## 2021-05-04 DIAGNOSIS — L97512 Non-pressure chronic ulcer of other part of right foot with fat layer exposed: Secondary | ICD-10-CM | POA: Diagnosis not present

## 2021-05-04 DIAGNOSIS — D631 Anemia in chronic kidney disease: Secondary | ICD-10-CM | POA: Diagnosis not present

## 2021-05-04 DIAGNOSIS — K859 Acute pancreatitis without necrosis or infection, unspecified: Secondary | ICD-10-CM | POA: Diagnosis not present

## 2021-05-04 DIAGNOSIS — J449 Chronic obstructive pulmonary disease, unspecified: Secondary | ICD-10-CM | POA: Diagnosis not present

## 2021-05-04 DIAGNOSIS — J441 Chronic obstructive pulmonary disease with (acute) exacerbation: Secondary | ICD-10-CM | POA: Diagnosis not present

## 2021-05-04 DIAGNOSIS — E11621 Type 2 diabetes mellitus with foot ulcer: Secondary | ICD-10-CM | POA: Diagnosis not present

## 2021-05-04 DIAGNOSIS — J9611 Chronic respiratory failure with hypoxia: Secondary | ICD-10-CM | POA: Diagnosis not present

## 2021-05-04 DIAGNOSIS — N186 End stage renal disease: Secondary | ICD-10-CM | POA: Diagnosis not present

## 2021-05-04 DIAGNOSIS — Z794 Long term (current) use of insulin: Secondary | ICD-10-CM | POA: Diagnosis not present

## 2021-05-04 DIAGNOSIS — E1169 Type 2 diabetes mellitus with other specified complication: Secondary | ICD-10-CM | POA: Diagnosis not present

## 2021-05-04 DIAGNOSIS — I132 Hypertensive heart and chronic kidney disease with heart failure and with stage 5 chronic kidney disease, or end stage renal disease: Secondary | ICD-10-CM | POA: Diagnosis not present

## 2021-05-04 DIAGNOSIS — Z79899 Other long term (current) drug therapy: Secondary | ICD-10-CM | POA: Diagnosis not present

## 2021-05-04 DIAGNOSIS — G4733 Obstructive sleep apnea (adult) (pediatric): Secondary | ICD-10-CM | POA: Diagnosis not present

## 2021-05-04 DIAGNOSIS — I5032 Chronic diastolic (congestive) heart failure: Secondary | ICD-10-CM | POA: Diagnosis not present

## 2021-05-04 DIAGNOSIS — Z9981 Dependence on supplemental oxygen: Secondary | ICD-10-CM | POA: Diagnosis not present

## 2021-05-04 DIAGNOSIS — M86071 Acute hematogenous osteomyelitis, right ankle and foot: Secondary | ICD-10-CM | POA: Diagnosis not present

## 2021-05-04 DIAGNOSIS — E1122 Type 2 diabetes mellitus with diabetic chronic kidney disease: Secondary | ICD-10-CM | POA: Diagnosis not present

## 2021-05-04 DIAGNOSIS — E1152 Type 2 diabetes mellitus with diabetic peripheral angiopathy with gangrene: Secondary | ICD-10-CM | POA: Diagnosis not present

## 2021-05-04 DIAGNOSIS — J9811 Atelectasis: Secondary | ICD-10-CM | POA: Diagnosis not present

## 2021-05-04 DIAGNOSIS — K219 Gastro-esophageal reflux disease without esophagitis: Secondary | ICD-10-CM | POA: Diagnosis not present

## 2021-05-04 DIAGNOSIS — Z992 Dependence on renal dialysis: Secondary | ICD-10-CM | POA: Diagnosis not present

## 2021-05-04 DIAGNOSIS — Z792 Long term (current) use of antibiotics: Secondary | ICD-10-CM | POA: Diagnosis not present

## 2021-05-04 LAB — AEROBIC/ANAEROBIC CULTURE W GRAM STAIN (SURGICAL/DEEP WOUND)

## 2021-05-05 DIAGNOSIS — L989 Disorder of the skin and subcutaneous tissue, unspecified: Secondary | ICD-10-CM | POA: Diagnosis not present

## 2021-05-05 DIAGNOSIS — N186 End stage renal disease: Secondary | ICD-10-CM | POA: Diagnosis not present

## 2021-05-05 DIAGNOSIS — Z992 Dependence on renal dialysis: Secondary | ICD-10-CM | POA: Diagnosis not present

## 2021-05-05 DIAGNOSIS — L97509 Non-pressure chronic ulcer of other part of unspecified foot with unspecified severity: Secondary | ICD-10-CM | POA: Diagnosis not present

## 2021-05-07 DIAGNOSIS — K859 Acute pancreatitis without necrosis or infection, unspecified: Secondary | ICD-10-CM | POA: Diagnosis not present

## 2021-05-07 DIAGNOSIS — E1152 Type 2 diabetes mellitus with diabetic peripheral angiopathy with gangrene: Secondary | ICD-10-CM | POA: Diagnosis not present

## 2021-05-07 DIAGNOSIS — N186 End stage renal disease: Secondary | ICD-10-CM | POA: Diagnosis not present

## 2021-05-07 DIAGNOSIS — J9611 Chronic respiratory failure with hypoxia: Secondary | ICD-10-CM | POA: Diagnosis not present

## 2021-05-07 DIAGNOSIS — I96 Gangrene, not elsewhere classified: Secondary | ICD-10-CM | POA: Diagnosis not present

## 2021-05-07 DIAGNOSIS — E1122 Type 2 diabetes mellitus with diabetic chronic kidney disease: Secondary | ICD-10-CM | POA: Diagnosis not present

## 2021-05-07 DIAGNOSIS — I5032 Chronic diastolic (congestive) heart failure: Secondary | ICD-10-CM | POA: Diagnosis not present

## 2021-05-07 DIAGNOSIS — L97509 Non-pressure chronic ulcer of other part of unspecified foot with unspecified severity: Secondary | ICD-10-CM | POA: Diagnosis not present

## 2021-05-07 DIAGNOSIS — E1169 Type 2 diabetes mellitus with other specified complication: Secondary | ICD-10-CM | POA: Diagnosis not present

## 2021-05-07 DIAGNOSIS — Z79899 Other long term (current) drug therapy: Secondary | ICD-10-CM | POA: Diagnosis not present

## 2021-05-07 DIAGNOSIS — K219 Gastro-esophageal reflux disease without esophagitis: Secondary | ICD-10-CM | POA: Diagnosis not present

## 2021-05-07 DIAGNOSIS — J441 Chronic obstructive pulmonary disease with (acute) exacerbation: Secondary | ICD-10-CM | POA: Diagnosis not present

## 2021-05-07 DIAGNOSIS — J9811 Atelectasis: Secondary | ICD-10-CM | POA: Diagnosis not present

## 2021-05-07 DIAGNOSIS — Z9981 Dependence on supplemental oxygen: Secondary | ICD-10-CM | POA: Diagnosis not present

## 2021-05-07 DIAGNOSIS — L97512 Non-pressure chronic ulcer of other part of right foot with fat layer exposed: Secondary | ICD-10-CM | POA: Diagnosis not present

## 2021-05-07 DIAGNOSIS — M86071 Acute hematogenous osteomyelitis, right ankle and foot: Secondary | ICD-10-CM | POA: Diagnosis not present

## 2021-05-07 DIAGNOSIS — D631 Anemia in chronic kidney disease: Secondary | ICD-10-CM | POA: Diagnosis not present

## 2021-05-07 DIAGNOSIS — Z992 Dependence on renal dialysis: Secondary | ICD-10-CM | POA: Diagnosis not present

## 2021-05-07 DIAGNOSIS — E11621 Type 2 diabetes mellitus with foot ulcer: Secondary | ICD-10-CM | POA: Diagnosis not present

## 2021-05-07 DIAGNOSIS — I5030 Unspecified diastolic (congestive) heart failure: Secondary | ICD-10-CM | POA: Diagnosis not present

## 2021-05-07 DIAGNOSIS — Z792 Long term (current) use of antibiotics: Secondary | ICD-10-CM | POA: Diagnosis not present

## 2021-05-07 DIAGNOSIS — Z794 Long term (current) use of insulin: Secondary | ICD-10-CM | POA: Diagnosis not present

## 2021-05-07 DIAGNOSIS — J449 Chronic obstructive pulmonary disease, unspecified: Secondary | ICD-10-CM | POA: Diagnosis not present

## 2021-05-07 DIAGNOSIS — I132 Hypertensive heart and chronic kidney disease with heart failure and with stage 5 chronic kidney disease, or end stage renal disease: Secondary | ICD-10-CM | POA: Diagnosis not present

## 2021-05-07 DIAGNOSIS — J9610 Chronic respiratory failure, unspecified whether with hypoxia or hypercapnia: Secondary | ICD-10-CM | POA: Diagnosis not present

## 2021-05-08 DIAGNOSIS — L989 Disorder of the skin and subcutaneous tissue, unspecified: Secondary | ICD-10-CM | POA: Diagnosis not present

## 2021-05-08 DIAGNOSIS — Z992 Dependence on renal dialysis: Secondary | ICD-10-CM | POA: Diagnosis not present

## 2021-05-08 DIAGNOSIS — L97509 Non-pressure chronic ulcer of other part of unspecified foot with unspecified severity: Secondary | ICD-10-CM | POA: Diagnosis not present

## 2021-05-08 DIAGNOSIS — N186 End stage renal disease: Secondary | ICD-10-CM | POA: Diagnosis not present

## 2021-05-09 DIAGNOSIS — Z794 Long term (current) use of insulin: Secondary | ICD-10-CM | POA: Diagnosis not present

## 2021-05-09 DIAGNOSIS — D631 Anemia in chronic kidney disease: Secondary | ICD-10-CM | POA: Diagnosis not present

## 2021-05-09 DIAGNOSIS — J9811 Atelectasis: Secondary | ICD-10-CM | POA: Diagnosis not present

## 2021-05-09 DIAGNOSIS — E1169 Type 2 diabetes mellitus with other specified complication: Secondary | ICD-10-CM | POA: Diagnosis not present

## 2021-05-09 DIAGNOSIS — M86071 Acute hematogenous osteomyelitis, right ankle and foot: Secondary | ICD-10-CM | POA: Diagnosis not present

## 2021-05-09 DIAGNOSIS — E1152 Type 2 diabetes mellitus with diabetic peripheral angiopathy with gangrene: Secondary | ICD-10-CM | POA: Diagnosis not present

## 2021-05-09 DIAGNOSIS — J441 Chronic obstructive pulmonary disease with (acute) exacerbation: Secondary | ICD-10-CM | POA: Diagnosis not present

## 2021-05-09 DIAGNOSIS — E11621 Type 2 diabetes mellitus with foot ulcer: Secondary | ICD-10-CM | POA: Diagnosis not present

## 2021-05-09 DIAGNOSIS — Z79899 Other long term (current) drug therapy: Secondary | ICD-10-CM | POA: Diagnosis not present

## 2021-05-09 DIAGNOSIS — K219 Gastro-esophageal reflux disease without esophagitis: Secondary | ICD-10-CM | POA: Diagnosis not present

## 2021-05-09 DIAGNOSIS — L989 Disorder of the skin and subcutaneous tissue, unspecified: Secondary | ICD-10-CM | POA: Diagnosis not present

## 2021-05-09 DIAGNOSIS — Z792 Long term (current) use of antibiotics: Secondary | ICD-10-CM | POA: Diagnosis not present

## 2021-05-09 DIAGNOSIS — I5032 Chronic diastolic (congestive) heart failure: Secondary | ICD-10-CM | POA: Diagnosis not present

## 2021-05-09 DIAGNOSIS — N186 End stage renal disease: Secondary | ICD-10-CM | POA: Diagnosis not present

## 2021-05-09 DIAGNOSIS — L97509 Non-pressure chronic ulcer of other part of unspecified foot with unspecified severity: Secondary | ICD-10-CM | POA: Diagnosis not present

## 2021-05-09 DIAGNOSIS — L97512 Non-pressure chronic ulcer of other part of right foot with fat layer exposed: Secondary | ICD-10-CM | POA: Diagnosis not present

## 2021-05-09 DIAGNOSIS — Z992 Dependence on renal dialysis: Secondary | ICD-10-CM | POA: Diagnosis not present

## 2021-05-09 DIAGNOSIS — E1122 Type 2 diabetes mellitus with diabetic chronic kidney disease: Secondary | ICD-10-CM | POA: Diagnosis not present

## 2021-05-09 DIAGNOSIS — Z9981 Dependence on supplemental oxygen: Secondary | ICD-10-CM | POA: Diagnosis not present

## 2021-05-09 DIAGNOSIS — K859 Acute pancreatitis without necrosis or infection, unspecified: Secondary | ICD-10-CM | POA: Diagnosis not present

## 2021-05-09 DIAGNOSIS — J9611 Chronic respiratory failure with hypoxia: Secondary | ICD-10-CM | POA: Diagnosis not present

## 2021-05-09 DIAGNOSIS — I132 Hypertensive heart and chronic kidney disease with heart failure and with stage 5 chronic kidney disease, or end stage renal disease: Secondary | ICD-10-CM | POA: Diagnosis not present

## 2021-05-10 DIAGNOSIS — L989 Disorder of the skin and subcutaneous tissue, unspecified: Secondary | ICD-10-CM | POA: Diagnosis not present

## 2021-05-10 DIAGNOSIS — Z992 Dependence on renal dialysis: Secondary | ICD-10-CM | POA: Diagnosis not present

## 2021-05-10 DIAGNOSIS — L97509 Non-pressure chronic ulcer of other part of unspecified foot with unspecified severity: Secondary | ICD-10-CM | POA: Diagnosis not present

## 2021-05-10 DIAGNOSIS — M86671 Other chronic osteomyelitis, right ankle and foot: Secondary | ICD-10-CM | POA: Diagnosis not present

## 2021-05-10 DIAGNOSIS — J441 Chronic obstructive pulmonary disease with (acute) exacerbation: Secondary | ICD-10-CM | POA: Diagnosis not present

## 2021-05-10 DIAGNOSIS — K869 Disease of pancreas, unspecified: Secondary | ICD-10-CM | POA: Diagnosis not present

## 2021-05-10 DIAGNOSIS — N186 End stage renal disease: Secondary | ICD-10-CM | POA: Diagnosis not present

## 2021-05-11 DIAGNOSIS — I132 Hypertensive heart and chronic kidney disease with heart failure and with stage 5 chronic kidney disease, or end stage renal disease: Secondary | ICD-10-CM | POA: Diagnosis not present

## 2021-05-11 DIAGNOSIS — L97512 Non-pressure chronic ulcer of other part of right foot with fat layer exposed: Secondary | ICD-10-CM | POA: Diagnosis not present

## 2021-05-11 DIAGNOSIS — D631 Anemia in chronic kidney disease: Secondary | ICD-10-CM | POA: Diagnosis not present

## 2021-05-11 DIAGNOSIS — E11621 Type 2 diabetes mellitus with foot ulcer: Secondary | ICD-10-CM | POA: Diagnosis not present

## 2021-05-11 DIAGNOSIS — I5032 Chronic diastolic (congestive) heart failure: Secondary | ICD-10-CM | POA: Diagnosis not present

## 2021-05-11 DIAGNOSIS — Z9981 Dependence on supplemental oxygen: Secondary | ICD-10-CM | POA: Diagnosis not present

## 2021-05-11 DIAGNOSIS — Z794 Long term (current) use of insulin: Secondary | ICD-10-CM | POA: Diagnosis not present

## 2021-05-11 DIAGNOSIS — E1122 Type 2 diabetes mellitus with diabetic chronic kidney disease: Secondary | ICD-10-CM | POA: Diagnosis not present

## 2021-05-11 DIAGNOSIS — E1151 Type 2 diabetes mellitus with diabetic peripheral angiopathy without gangrene: Secondary | ICD-10-CM | POA: Diagnosis not present

## 2021-05-11 DIAGNOSIS — N186 End stage renal disease: Secondary | ICD-10-CM | POA: Diagnosis not present

## 2021-05-11 DIAGNOSIS — Z992 Dependence on renal dialysis: Secondary | ICD-10-CM | POA: Diagnosis not present

## 2021-05-12 DIAGNOSIS — J9610 Chronic respiratory failure, unspecified whether with hypoxia or hypercapnia: Secondary | ICD-10-CM | POA: Diagnosis not present

## 2021-05-12 DIAGNOSIS — J449 Chronic obstructive pulmonary disease, unspecified: Secondary | ICD-10-CM | POA: Diagnosis not present

## 2021-05-14 DIAGNOSIS — E11621 Type 2 diabetes mellitus with foot ulcer: Secondary | ICD-10-CM | POA: Diagnosis not present

## 2021-05-14 DIAGNOSIS — L97512 Non-pressure chronic ulcer of other part of right foot with fat layer exposed: Secondary | ICD-10-CM | POA: Diagnosis not present

## 2021-05-14 DIAGNOSIS — N186 End stage renal disease: Secondary | ICD-10-CM | POA: Diagnosis not present

## 2021-05-14 DIAGNOSIS — E1122 Type 2 diabetes mellitus with diabetic chronic kidney disease: Secondary | ICD-10-CM | POA: Diagnosis not present

## 2021-05-14 DIAGNOSIS — I5032 Chronic diastolic (congestive) heart failure: Secondary | ICD-10-CM | POA: Diagnosis not present

## 2021-05-14 DIAGNOSIS — D631 Anemia in chronic kidney disease: Secondary | ICD-10-CM | POA: Diagnosis not present

## 2021-05-14 DIAGNOSIS — Z9981 Dependence on supplemental oxygen: Secondary | ICD-10-CM | POA: Diagnosis not present

## 2021-05-14 DIAGNOSIS — I132 Hypertensive heart and chronic kidney disease with heart failure and with stage 5 chronic kidney disease, or end stage renal disease: Secondary | ICD-10-CM | POA: Diagnosis not present

## 2021-05-14 DIAGNOSIS — Z794 Long term (current) use of insulin: Secondary | ICD-10-CM | POA: Diagnosis not present

## 2021-05-14 DIAGNOSIS — Z992 Dependence on renal dialysis: Secondary | ICD-10-CM | POA: Diagnosis not present

## 2021-05-14 DIAGNOSIS — E1151 Type 2 diabetes mellitus with diabetic peripheral angiopathy without gangrene: Secondary | ICD-10-CM | POA: Diagnosis not present

## 2021-05-15 DIAGNOSIS — N186 End stage renal disease: Secondary | ICD-10-CM | POA: Diagnosis not present

## 2021-05-15 DIAGNOSIS — L97509 Non-pressure chronic ulcer of other part of unspecified foot with unspecified severity: Secondary | ICD-10-CM | POA: Diagnosis not present

## 2021-05-15 DIAGNOSIS — Z5181 Encounter for therapeutic drug level monitoring: Secondary | ICD-10-CM | POA: Diagnosis not present

## 2021-05-15 DIAGNOSIS — Z792 Long term (current) use of antibiotics: Secondary | ICD-10-CM | POA: Diagnosis not present

## 2021-05-15 DIAGNOSIS — L989 Disorder of the skin and subcutaneous tissue, unspecified: Secondary | ICD-10-CM | POA: Diagnosis not present

## 2021-05-15 DIAGNOSIS — Z992 Dependence on renal dialysis: Secondary | ICD-10-CM | POA: Diagnosis not present

## 2021-05-16 DIAGNOSIS — D631 Anemia in chronic kidney disease: Secondary | ICD-10-CM | POA: Diagnosis not present

## 2021-05-16 DIAGNOSIS — E11621 Type 2 diabetes mellitus with foot ulcer: Secondary | ICD-10-CM | POA: Diagnosis not present

## 2021-05-16 DIAGNOSIS — I132 Hypertensive heart and chronic kidney disease with heart failure and with stage 5 chronic kidney disease, or end stage renal disease: Secondary | ICD-10-CM | POA: Diagnosis not present

## 2021-05-16 DIAGNOSIS — E1151 Type 2 diabetes mellitus with diabetic peripheral angiopathy without gangrene: Secondary | ICD-10-CM | POA: Diagnosis not present

## 2021-05-16 DIAGNOSIS — I5032 Chronic diastolic (congestive) heart failure: Secondary | ICD-10-CM | POA: Diagnosis not present

## 2021-05-16 DIAGNOSIS — L97512 Non-pressure chronic ulcer of other part of right foot with fat layer exposed: Secondary | ICD-10-CM | POA: Diagnosis not present

## 2021-05-16 DIAGNOSIS — N186 End stage renal disease: Secondary | ICD-10-CM | POA: Diagnosis not present

## 2021-05-16 DIAGNOSIS — E1122 Type 2 diabetes mellitus with diabetic chronic kidney disease: Secondary | ICD-10-CM | POA: Diagnosis not present

## 2021-05-16 DIAGNOSIS — Z992 Dependence on renal dialysis: Secondary | ICD-10-CM | POA: Diagnosis not present

## 2021-05-16 DIAGNOSIS — Z794 Long term (current) use of insulin: Secondary | ICD-10-CM | POA: Diagnosis not present

## 2021-05-16 DIAGNOSIS — Z9981 Dependence on supplemental oxygen: Secondary | ICD-10-CM | POA: Diagnosis not present

## 2021-05-17 DIAGNOSIS — L989 Disorder of the skin and subcutaneous tissue, unspecified: Secondary | ICD-10-CM | POA: Diagnosis not present

## 2021-05-17 DIAGNOSIS — Z992 Dependence on renal dialysis: Secondary | ICD-10-CM | POA: Diagnosis not present

## 2021-05-17 DIAGNOSIS — N186 End stage renal disease: Secondary | ICD-10-CM | POA: Diagnosis not present

## 2021-05-17 DIAGNOSIS — L97509 Non-pressure chronic ulcer of other part of unspecified foot with unspecified severity: Secondary | ICD-10-CM | POA: Diagnosis not present

## 2021-05-18 ENCOUNTER — Encounter: Payer: Medicare Other | Attending: Physician Assistant | Admitting: Physician Assistant

## 2021-05-18 ENCOUNTER — Other Ambulatory Visit: Payer: Self-pay

## 2021-05-18 DIAGNOSIS — Z9981 Dependence on supplemental oxygen: Secondary | ICD-10-CM | POA: Diagnosis not present

## 2021-05-18 DIAGNOSIS — E11621 Type 2 diabetes mellitus with foot ulcer: Secondary | ICD-10-CM | POA: Diagnosis not present

## 2021-05-18 DIAGNOSIS — I132 Hypertensive heart and chronic kidney disease with heart failure and with stage 5 chronic kidney disease, or end stage renal disease: Secondary | ICD-10-CM | POA: Diagnosis not present

## 2021-05-18 DIAGNOSIS — L97514 Non-pressure chronic ulcer of other part of right foot with necrosis of bone: Secondary | ICD-10-CM | POA: Insufficient documentation

## 2021-05-18 DIAGNOSIS — E1122 Type 2 diabetes mellitus with diabetic chronic kidney disease: Secondary | ICD-10-CM | POA: Insufficient documentation

## 2021-05-18 DIAGNOSIS — E1169 Type 2 diabetes mellitus with other specified complication: Secondary | ICD-10-CM | POA: Diagnosis not present

## 2021-05-18 DIAGNOSIS — J9611 Chronic respiratory failure with hypoxia: Secondary | ICD-10-CM | POA: Diagnosis not present

## 2021-05-18 DIAGNOSIS — M86371 Chronic multifocal osteomyelitis, right ankle and foot: Secondary | ICD-10-CM | POA: Insufficient documentation

## 2021-05-18 DIAGNOSIS — J449 Chronic obstructive pulmonary disease, unspecified: Secondary | ICD-10-CM | POA: Diagnosis not present

## 2021-05-18 DIAGNOSIS — L97512 Non-pressure chronic ulcer of other part of right foot with fat layer exposed: Secondary | ICD-10-CM | POA: Diagnosis not present

## 2021-05-18 DIAGNOSIS — I5032 Chronic diastolic (congestive) heart failure: Secondary | ICD-10-CM | POA: Diagnosis not present

## 2021-05-18 DIAGNOSIS — N186 End stage renal disease: Secondary | ICD-10-CM | POA: Diagnosis not present

## 2021-05-18 DIAGNOSIS — Z992 Dependence on renal dialysis: Secondary | ICD-10-CM | POA: Insufficient documentation

## 2021-05-18 DIAGNOSIS — I5042 Chronic combined systolic (congestive) and diastolic (congestive) heart failure: Secondary | ICD-10-CM | POA: Diagnosis not present

## 2021-05-18 NOTE — Progress Notes (Signed)
Demartini, [name] ([id number]) ?Visit Report for 05/18/2021 ?Abuse Risk Screen Details ?Patient Name: [name], [name] ?Date of Service: 05/18/2021 12:45 PM ?Medical Record Number: [id number] ?Patient Account Number: [id number] ?Date of Birth/Sex: Oct 03, 1970 (51 y.o. F) ?Treating RN: Levora Dredge ?Primary Care Tyquon Near: Margurite Auerbach Other Clinician: ?Referring Solon Alban: Referral, Self ?Treating Tajah Noguchi/Extender: Jeri Cos ?Weeks in Treatment: 0 ?Abuse Risk Screen Items ?Answer ?ABUSE RISK SCREEN: ?Has anyone close to you tried to hurt or harm you recentlyo No ?Do you feel uncomfortable with anyone in your familyo No ?Has anyone forced you do things that you didnot want to doo No ?Electronic Signature(s) ?Signed: 05/18/2021 4:31:50 PM By: Levora Dredge ?Entered By: Levora Dredge on 05/18/2021 12:59:43 ?Lemarr, AIRYN ELLZEY. ([id number]) ?-------------------------------------------------------------------------------- ?Activities of Daily Living Details ?Patient Name: [name] ?Date of Service: 05/18/2021 12:45 PM ?Medical Record Number: [id number] ?Patient Account Number: [id number] ?Date of Birth/Sex: 10-03-1970 (51 y.o. F) ?Treating RN: Levora Dredge ?Primary Care Laylynn Campanella: Margurite Auerbach Other Clinician: ?Referring Rylend Pietrzak: Referral, Self ?Treating Aydrian Halpin/Extender: Jeri Cos ?Weeks in Treatment: 0 ?Activities of Daily Living Items ?Answer ?Activities of Daily Living (Please select one for each item) ?Drive Automobile Not Able ?Take Medications Completely Able ?Use Telephone Completely Able ?Care for Appearance Completely Able ?Use Toilet Need Assistance ?Bath / Shower Need Assistance ?Dress Self Completely Able ?Feed Self Completely Able ?Walk Need Assistance ?Get In / Out Bed Need Assistance ?Housework Need Assistance ?Prepare Meals Need Assistance ?Handle Money Need Assistance ?Shop for Self Need Assistance ?Electronic Signature(s) ?Signed: 05/18/2021 4:31:50 PM By: Levora Dredge ?Entered By:  Levora Dredge on 05/18/2021 13:00:29 ?Mauro, SHAELYN DECARLI. ([id number]) ?-------------------------------------------------------------------------------- ?Education Screening Details ?Patient Name: [name], [name] ?Date of Service: 05/18/2021 12:45 PM ?Medical Record Number: [id number] ?Patient Account Number: [id number] ?Date of Birth/Sex: August 03, 1970 (51 y.o. F) ?Treating RN: Levora Dredge ?Primary Care Josemanuel Eakins: Margurite Auerbach Other Clinician: ?Referring Malyssa Maris: Referral, Self ?Treating Bora Broner/Extender: Jeri Cos ?Weeks in Treatment: 0 ?Learning Preferences/Education Level/Primary Language ?Learning Preference: Explanation, Demonstration, Video, Communication Board, Printed Material ?Preferred Language: English ?Cognitive Barrier ?Language Barrier: No ?Translator Needed: No ?Memory Deficit: No ?Emotional Barrier: No ?Cultural/Religious Beliefs Affecting Medical Care: No ?Physical Barrier ?Impaired Vision: No ?Impaired Hearing: Yes right side ?Decreased Hand dexterity: No ?Knowledge/Comprehension ?Knowledge Level: High ?Comprehension Level: High ?Ability to understand written instructions: High ?Ability to understand verbal instructions: High ?Motivation ?Anxiety Level: Calm ?Cooperation: Cooperative ?Education Importance: Acknowledges Need ?Interest in Health Problems: Asks Questions ?Perception: Coherent ?Willingness to Engage in Self-Management ?High ?Activities: ?Readiness to Engage in Self-Management ?High ?Activities: ?Electronic Signature(s) ?Signed: 05/18/2021 4:31:50 PM By: Levora Dredge ?Entered By: Levora Dredge on 05/18/2021 13:00:56 ?Roorda, LUSINE CORLETT. ([id number]) ?-------------------------------------------------------------------------------- ?Fall Risk Assessment Details ?Patient Name: [name], [name] ?Date of Service: 05/18/2021 12:45 PM ?Medical Record Number: [id number] ?Patient Account Number: [id number] ?Date of Birth/Sex: Sep 19, 1970 (51 y.o. F) ?Treating RN: Levora Dredge ?Primary Care  Shaquella Stamant: Margurite Auerbach Other Clinician: ?Referring Daesha Insco: Referral, Self ?Treating Banner Huckaba/Extender: Jeri Cos ?Weeks in Treatment: 0 ?Fall Risk Assessment Items ?Have you had 2 or more falls in the last 12 monthso 0 No ?Have you had any fall that resulted in injury in the last 12 monthso 0 No ?FALLS RISK SCREEN ?History of falling - immediate or within 3 months 0 No ?Secondary diagnosis (Do you have 2 or more medical diagnoseso) 0 No ?Ambulatory aid ?None/bed rest/wheelchair/nurse 0 Yes ?Crutches/cane/walker 0 No ?Furniture 0 No ?Intravenous therapy Access/Saline/Heparin Lock 0 No ?Gait/Transferring ?Normal/ bed rest/ wheelchair 0 Yes ?Weak (short steps with or without shuffle, stooped but  able to lift head while walking, may ?0 No ?seek support from furniture) ?Impaired (short steps with shuffle, may have difficulty arising from chair, head down, impaired ?0 No ?balance) ?Mental Status ?Oriented to own ability 0 Yes ?Electronic Signature(s) ?Signed: 05/18/2021 4:31:50 PM By: Levora Dredge ?Entered By: Levora Dredge on 05/18/2021 13:01:07 ?Laban, TARAE WOODEN. ([id number]) ?-------------------------------------------------------------------------------- ?Foot Assessment Details ?Patient Name: [name], [name] ?Date of Service: 05/18/2021 12:45 PM ?Medical Record Number: [id number] ?Patient Account Number: [id number] ?Date of Birth/Sex: 06/03/1970 (51 y.o. F) ?Treating RN: Levora Dredge ?Primary Care Kacin Dancy: Margurite Auerbach Other Clinician: ?Referring Coraleigh Sheeran: Referral, Self ?Treating Alexee Delsanto/Extender: Jeri Cos ?Weeks in Treatment: 0 ?Foot Assessment Items ?Site Locations ?+ = Sensation present, - = Sensation absent, C = Callus, U = Ulcer ?R = Redness, W = Warmth, M = Maceration, PU = Pre-ulcerative lesion ?F = Fissure, S = Swelling, D = Dryness ?Assessment ?Right: Left: ?Other Deformity: Yes No ?Prior Foot Ulcer: No No ?Prior Amputation: Yes No ?Charcot Joint: No No ?Ambulatory Status:  Non-ambulatory ?Assistance Device: Wheelchair ?Gait: Steady ?Electronic Signature(s) ?Signed: 05/18/2021 4:31:50 PM By: Levora Dredge ?Entered By: Levora Dredge on 05/18/2021 13:20:13 ?Propp, LAKIRA OGANDO. ([id number]) ?-------------------------------------------------------------------------------- ?Nutrition Risk Screening Details ?Patient Name: Lindy, COLETTE DICAMILLO. ?Date of Service: 05/18/2021 12:45 PM ?Medical Record Number: [id number] ?Patient Account Number: [id number] ?Date of Birth/Sex: [birth year] (51 y.o. F) ?Treating RN: Levora Dredge ?Primary Care Zhoey Blackstock: Margurite Auerbach Other Clinician: ?Referring Demetris Meinhardt: Referral, Self ?Treating Erhardt Dada/Extender: Jeri Cos ?Weeks in Treatment: 0 ?Height (in): 64 ?Weight (lbs): 259 ?Body Mass Index (BMI): 44.5 ?Nutrition Risk Screening Items ?Score Screening ?NUTRITION RISK SCREEN: ?I have an illness or condition that made me change the kind and/or amount of food I eat 0 No ?I eat fewer than two meals per day 0 No ?I eat few fruits and vegetables, or milk products 0 No ?I have three or more drinks of beer, liquor or wine almost every day 0 No ?I have tooth or mouth problems that make it hard for me to eat 0 No ?I don't always have enough money to buy the food I need 0 No ?I eat alone most of the time 0 No ?I take three or more different prescribed or over-the-counter drugs a day 0 No ?Without wanting to, I have lost or gained 10 pounds in the last six months 0 No ?I am not always physically able to shop, cook and/or feed myself 0 No ?Nutrition Protocols ?Good Risk Protocol 0 No interventions needed ?Moderate Risk Protocol ?High Risk Proctocol ?Risk Level: Good Risk ?Score: 0 ?Electronic Signature(s) ?Signed: 05/18/2021 4:31:50 PM By: Levora Dredge ?Entered By: Levora Dredge on 05/18/2021 13:01:29 ?

## 2021-05-19 DIAGNOSIS — N186 End stage renal disease: Secondary | ICD-10-CM | POA: Diagnosis not present

## 2021-05-19 DIAGNOSIS — Z992 Dependence on renal dialysis: Secondary | ICD-10-CM | POA: Diagnosis not present

## 2021-05-19 DIAGNOSIS — L97509 Non-pressure chronic ulcer of other part of unspecified foot with unspecified severity: Secondary | ICD-10-CM | POA: Diagnosis not present

## 2021-05-19 DIAGNOSIS — L989 Disorder of the skin and subcutaneous tissue, unspecified: Secondary | ICD-10-CM | POA: Diagnosis not present

## 2021-05-19 NOTE — Progress Notes (Signed)
[name], [name] ([id number]) ?Visit Report for 05/18/2021 ?Chief Complaint Document Details ?Patient Name: [name], [name] ?Date of Service: 05/18/2021 12:45 PM ?Medical Record Number: [id number] ?Patient Account Number: [id number] ?Date of Birth/Sex: [birth year] (51 y.o. F) ?Treating RN: Levora Dredge ?Primary Care Provider: Margurite Auerbach Other Clinician: ?Referring Provider: Referral, Self ?Treating Provider/Extender: Jeri Cos ?Weeks in Treatment: 0 ?Information Obtained from: Patient ?Chief Complaint ?Right foot gangrene and osteomyelitis ?Electronic Signature(s) ?Signed: 05/18/2021 5:06:45 PM By: Worthy Keeler PA-C ?Entered By: Worthy Keeler on 05/18/2021 17:06:45 ?[name], [name] ([id number]) ?-------------------------------------------------------------------------------- ?HPI Details ?Patient Name: [name] ?Date of Service: 05/18/2021 12:45 PM ?Medical Record Number: [id number] ?Patient Account Number: [id number] ?Date of Birth/Sex: 01/10/1971 (51 y.o. F) ?Treating RN: Levora Dredge ?Primary Care Provider: Margurite Auerbach Other Clinician: ?Referring Provider: Referral, Self ?Treating Provider/Extender: Jeri Cos ?Weeks in Treatment: 0 ?History of Present Illness ?HPI Description: 10/04/2020 upon evaluation today patient presents for initial inspection here in the clinic concerning a fairly concerning wound ?over her right fifth toe towards the medial aspect which actually probes all the way down to bone. There is evidence of necrotic muscle as well and ?I do see tendon exposed as well. Subsequently this does have me very concerned about where things stand here. Again this is the first time that I ?am seeing her and on top of everything else her ABIs are noncompressible. I think that arterial flow is the primary concern here. ?Of note the patient states this was first noted July 1. She had a x-ray on July 4 which was negative for bone infection. She has been on 2 ?antibiotics she is unsure of  the name. With that being said her most recent hemoglobin A1c was 8.1 that was December 2022. She otherwise does ?have a history obviously of diabetes mellitus type 2, hypertension, end-stage renal disease with dependence on renal dialysis, and she is also ?dependent on supplemental oxygen. ?Readmission: ?05/14/2021 this is a patient whom I previously seen October 04, 2020. With that being said she is subsequently on 21 October 2020 ended up ?having an amputation of her fifth digit of her foot unfortunately. This had progressed fairly quickly into what sounds to been gangrene at that ?time. Subsequently right now based on what I am seeing the patient actually has a necrotic area on the end of her foot and has been ?recommended that the only option really here is going to be a amputation which would be a below-knee amputation. She is really not interested in ?that and wants to try to give this every chance it can to heal. For that reason I discussed with her that definitely we can see what we can do to try ?to improve this situation and try to help get this doing better as best we can. With that being said there is definitely a portion of her wound that is ?just not good to be able to heal no matter what it is already starting to auto amputate. This includes potentially digits of her foot as well based on ?what I am seeing. She does have confirmed osteomyelitis noted by MRI. She is also currently on IV vancomycin. She does undergo dialysis on ?Tuesday, Thursday, and Saturday. ?The patient does have a history of diabetes mellitus type 2, congestive heart failure, hypertension, end-stage renal disease, dependence on renal ?dialysis, and dependence on supplemental oxygen. With that being said I am going to need to look into her heart failure and the significance of ?this as far as hyperbarics  is concerned before we initiate treatment. She is probably also going to need an updated EKG and chest x-ray unless its ?already  been done recently. ?Electronic Signature(s) ?Signed: 05/18/2021 5:03:29 PM By: Worthy Keeler PA-C ?Entered By: Worthy Keeler on 05/18/2021 17:03:29 ?[name], [name] ([id number]) ?-------------------------------------------------------------------------------- ?Physical Exam Details ?Patient Name: [name], [name] ?Date of Service: 05/18/2021 12:45 PM ?Medical Record Number: [id number] ?Patient Account Number: [id number] ?Date of Birth/Sex: 1970/08/16 (51 y.o. F) ?Treating RN: Levora Dredge ?Primary Care Provider: Margurite Auerbach Other Clinician: ?Referring Provider: Referral, Self ?Treating Provider/Extender: Jeri Cos ?Weeks in Treatment: 0 ?Constitutional ?patient is hypertensive.. pulse regular and within target range for patient.[name] respirations regular, non-labored and within target range for patient.. ?temperature within target range for patient.. Obese and well-hydrated in no acute distress. ?Eyes ?conjunctiva clear no eyelid edema noted. pupils equal round and reactive to light and accommodation. ?Ears, Nose, Mouth, and Throat ?no gross abnormality of ear auricles or external auditory canals. normal hearing noted during conversation. mucus membranes moist. ?Respiratory ?normal breathing without difficulty. ?Musculoskeletal ?normal gait and posture. no significant deformity or arthritic changes, no loss or range of motion, no clubbing. ?Psychiatric ?this patient is able to make decisions and demonstrates good insight into disease process. Alert and Oriented x 3. pleasant and cooperative. ?Notes ?Upon inspection patient's wound again showed signs of significant gangrene on the end of her foot. Obviously proximally there is some good ?tissue that is starting to already grow which is great news and I am very pleased in that regard. With that being said I do believe that she could ?benefit from hyperbaric oxygen therapy. This would not be to save the end of her foot but rather limb salvage in general trying  to prevent a ?below-knee amputation. I do believe that end of her foot is good to auto amputate no matter what we do. Obviously if things become worse and ?she becomes sick then she states she will have to do what she has to do but right now she is interested in trying to do everything she can to save ?as much of her foot as possible. ?Electronic Signature(s) ?Signed: 05/18/2021 5:04:16 PM By: Worthy Keeler PA-C ?Entered By: Worthy Keeler on 05/18/2021 17:04:16 ?[name], [name] ([id number]) ?-------------------------------------------------------------------------------- ?Physician Orders Details ?Patient Name: [name], [name] ?Date of Service: 05/18/2021 12:45 PM ?Medical Record Number: [id number] ?Patient Account Number: [id number] ?Date of Birth/Sex: 12-06-1970 (51 y.o. F) ?Treating RN: Levora Dredge ?Primary Care Provider: Margurite Auerbach Other Clinician: ?Referring Provider: Referral, Self ?Treating Provider/Extender: Jeri Cos ?Weeks in Treatment: 0 ?Verbal / Phone Orders: No ?Diagnosis Coding ?ICD-10 Coding ?Code Description ?E11.621 Type 2 diabetes mellitus with foot ulcer ?L97.514 Non-pressure chronic ulcer of other part of right foot with necrosis of bone ?I50.42 Chronic combined systolic (congestive) and diastolic (congestive) heart failure ?I10 Essential (primary) hypertension ?N18.6 End stage renal disease ?Z99.2 Dependence on renal dialysis ?Z99.81 Dependence on supplemental oxygen ?Follow-up Appointments ?o Return Appointment in 1 week. ?o Nurse Visit as needed ?Home Health ?[place]: - [facility] ?o [place] for wound care. May utilize formulary equivalent dressing for wound treatment orders unless ?otherwise specified. Home Health Nurse may visit PR[facility] to address patientos wound care needs. ?o Scheduled days for dressing changes to be completed; exception, patient has scheduled wound care visit that day. ?o **Please direct any NON-WOUND related issues/requests  for orders to patient's Primary Care Physician. **If current ?dressing causes regression in wound condition, may D/C ordered dressing  product/s and apply Normal Saline Moist ?Dressing daily until next [facility]

## 2021-05-21 DIAGNOSIS — Z992 Dependence on renal dialysis: Secondary | ICD-10-CM | POA: Diagnosis not present

## 2021-05-21 DIAGNOSIS — E1151 Type 2 diabetes mellitus with diabetic peripheral angiopathy without gangrene: Secondary | ICD-10-CM | POA: Diagnosis not present

## 2021-05-21 DIAGNOSIS — I132 Hypertensive heart and chronic kidney disease with heart failure and with stage 5 chronic kidney disease, or end stage renal disease: Secondary | ICD-10-CM | POA: Diagnosis not present

## 2021-05-21 DIAGNOSIS — E11621 Type 2 diabetes mellitus with foot ulcer: Secondary | ICD-10-CM | POA: Diagnosis not present

## 2021-05-21 DIAGNOSIS — E1122 Type 2 diabetes mellitus with diabetic chronic kidney disease: Secondary | ICD-10-CM | POA: Diagnosis not present

## 2021-05-21 DIAGNOSIS — N186 End stage renal disease: Secondary | ICD-10-CM | POA: Diagnosis not present

## 2021-05-21 DIAGNOSIS — D631 Anemia in chronic kidney disease: Secondary | ICD-10-CM | POA: Diagnosis not present

## 2021-05-21 DIAGNOSIS — I5032 Chronic diastolic (congestive) heart failure: Secondary | ICD-10-CM | POA: Diagnosis not present

## 2021-05-21 DIAGNOSIS — Z794 Long term (current) use of insulin: Secondary | ICD-10-CM | POA: Diagnosis not present

## 2021-05-21 DIAGNOSIS — H3343 Traction detachment of retina, bilateral: Secondary | ICD-10-CM | POA: Diagnosis not present

## 2021-05-21 DIAGNOSIS — L97512 Non-pressure chronic ulcer of other part of right foot with fat layer exposed: Secondary | ICD-10-CM | POA: Diagnosis not present

## 2021-05-21 DIAGNOSIS — Z9981 Dependence on supplemental oxygen: Secondary | ICD-10-CM | POA: Diagnosis not present

## 2021-05-21 NOTE — Progress Notes (Signed)
Shanks, DEREKA LUERAS. ([id number]) ?Visit Report for 05/18/2021 ?Allergy List Details ?Patient Name: [name], [name] ?Date of Service: 05/18/2021 12:45 PM ?Medical Record Number: [id number] ?Patient Account Number: [id number] ?Date of Birth/Sex: [birth year] (51 y.o. F) ?Treating RN: Levora Dredge ?Primary Care Ezelle Surprenant: Margurite Auerbach Other Clinician: ?Referring Hebah Bogosian: Referral, Self ?Treating Spyridon Hornstein/Extender: Jeri Cos ?Weeks in Treatment: 0 ?Allergies ?Active Allergies ?Sulfa (Sulfonamide Antibiotics) ?Feraheme ?Reaction: itching ?pollen extracts ?Reaction: SOB ?mold extracts ?Reaction: SOB itching ?house dust mite ?Reaction: SOB itching ?Allergy Notes ?Electronic Signature(s) ?Signed: 05/18/2021 4:31:50 PM By: Levora Dredge ?Entered By: Levora Dredge on 05/18/2021 12:55:36 ?Youngblood, HERLINDA HEADY. ([id number]) ?-------------------------------------------------------------------------------- ?Arrival Information Details ?Patient Name: [name] ?Date of Service: 05/18/2021 12:45 PM ?Medical Record Number: [id number] ?Patient Account Number: [id number] ?Date of Birth/Sex: [birth year] (51 y.o. F) ?Treating RN: Levora Dredge ?Primary Care Darlynn Ricco: Margurite Auerbach Other Clinician: ?Referring Gerri Acre: Referral, Self ?Treating Jaquetta Currier/Extender: Jeri Cos ?Weeks in Treatment: 0 ?Visit Information ?Patient Arrived: Wheel Chair ?Arrival Time: 12:52 ?Accompanied By: husband ?Transfer Assistance: EasyPivot Patient Lift ?Patient Identification Verified: Yes ?Secondary Verification Process Completed: Yes ?History Since Last Visit ?Added or deleted any medications: No ?Any new allergies or adverse reactions: No ?Had a fall or experienced change in activities of daily living that may affect risk of falls: No ?Hospitalized since last visit: No ?Has Dressing in Place as Prescribed: Yes ?Electronic Signature(s) ?Signed: 05/18/2021 4:31:50 PM By: Levora Dredge ?Entered By: Levora Dredge on 05/18/2021 12:52:48 ?Vanderkolk,  REYES FIFIELD. ([id number]) ?-------------------------------------------------------------------------------- ?Clinic Level of Care Assessment Details ?Patient Name: [name] ?Date of Service: 05/18/2021 12:45 PM ?Medical Record Number: [id number] ?Patient Account Number: [id number] ?Date of Birth/Sex: 1970-07-29 (51 y.o. F) ?Treating RN: Levora Dredge ?Primary Care Noelie Renfrow: Margurite Auerbach Other Clinician: ?Referring Jermiyah Ricotta: Referral, Self ?Treating Shira Bobst/Extender: Jeri Cos ?Weeks in Treatment: 0 ?Clinic Level of Care Assessment Items ?TOOL 2 Quantity Score ?'[]'$  - Use when only an EandM is performed on the INITIAL visit 0 ?ASSESSMENTS - Nursing Assessment / Reassessment ?'[]'$  - General Physical Exam (combine w/ comprehensive assessment (listed just below) when performed on new ?0 ?pt. evals) ?X- 1 25 ?Comprehensive Assessment (HX, ROS, Risk Assessments, Wounds Hx, etc.) ?ASSESSMENTS - Wound and Skin Assessment / Reassessment ?X - Simple Wound Assessment / Reassessment - one wound 1 5 ?'[]'$  - 0 ?Complex Wound Assessment / Reassessment - multiple wounds ?'[]'$  - 0 ?Dermatologic / Skin Assessment (not related to wound area) ?ASSESSMENTS - Ostomy and/or Continence Assessment and Care ?'[]'$  - Incontinence Assessment and Management 0 ?'[]'$  - 0 ?Ostomy Care Assessment and Management (repouching, etc.) ?PROCESS - Coordination of Care ?X - Simple Patient / Family Education for ongoing care 1 15 ?'[]'$  - 0 ?Complex (extensive) Patient / Family Education for ongoing care ?X- 1 10 ?Staff obtains Consents, Records, Test Results / Process Orders ?'[]'$  - 0 ?Staff telephones HHA, Nursing Homes / Clarify orders / etc ?'[]'$  - 0 ?Routine Transfer to another Facility (non-emergent condition) ?'[]'$  - 0 ?Routine Hospital Admission (non-emergent condition) ?X- 1 15 ?New Admissions / Biomedical engineer / Ordering NPWT, Apligraf, etc. ?'[]'$  - 0 ?Emergency Hospital Admission (emergent condition) ?X- 1 10 ?Simple Discharge Coordination ?'[]'$  -  0 ?Complex (extensive) Discharge Coordination ?PROCESS - Special Needs ?'[]'$  - Pediatric / Minor Patient Management 0 ?'[]'$  - 0 ?Isolation Patient Management ?'[]'$  - 0 ?Hearing / Language / Visual special needs ?'[]'$  - 0 ?Assessment of Community assistance (transportation, D/C planning, etc.) ?'[]'$  - 0 ?Additional assistance / Altered mentation ?'[]'$  - 0 ?Support Surface(s) Assessment (bed, cushion,  seat, etc.) ?INTERVENTIONS - Wound Cleansing / Measurement ?X - Wound Imaging (photographs - any number of wounds) 1 5 ?'[]'$  - 0 ?Wound Tracing (instead of photographs) ?X- 1 5 ?Simple Wound Measurement - one wound ?'[]'$  - 0 ?Complex Wound Measurement - multiple wounds ?Machnik, CHRISTIANNE ZACHER. ([id number]) ?X- 1 5 ?Simple Wound Cleansing - one wound ?'[]'$  - 0 ?Complex Wound Cleansing - multiple wounds ?INTERVENTIONS - Wound Dressings ?X - Small Wound Dressing one or multiple wounds 1 10 ?'[]'$  - 0 ?Medium Wound Dressing one or multiple wounds ?'[]'$  - 0 ?Large Wound Dressing one or multiple wounds ?'[]'$  - 0 ?Application of Medications - injection ?INTERVENTIONS - Miscellaneous ?'[]'$  - External ear exam 0 ?'[]'$  - 0 ?Specimen Collection (cultures, biopsies, blood, body fluids, etc.) ?'[]'$  - 0 ?Specimen(s) / Culture(s) sent or taken to Lab for analysis ?'[]'$  - 0 ?Patient Transfer (multiple staff / Civil Service fast streamer / Similar devices) ?'[]'$  - 0 ?Simple Staple / Suture removal (25 or less) ?'[]'$  - 0 ?Complex Staple / Suture removal (26 or more) ?'[]'$  - 0 ?Hypo / Hyperglycemic Management (close monitor of Blood Glucose) ?X- 1 15 ?Ankle / Brachial Index (ABI) - do not check if billed separately ?Has the patient been seen at the hospital within the last three years: Yes ?Total Score: 120 ?Level Of Care: New/Established - Level ?4 ?Electronic Signature(s) ?Signed: 05/18/2021 4:31:50 PM By: Levora Dredge ?Entered By: Levora Dredge on 05/18/2021 16:24:28 ?Forester, CHANETTA MOOSMAN. ([id number]) ?-------------------------------------------------------------------------------- ?Encounter  Discharge Information Details ?Patient Name: Walmer, [name] ?Date of Service: 05/18/2021 12:45 PM ?Medical Record Number: [id number] ?Patient Account Number: [id number] ?Date of Birth/Sex: Nov 27, 1970 (51 y.o. F) ?Treating RN: Levora Dredge ?Primary Care Taryne Kiger: Margurite Auerbach Other Clinician: ?Referring Shyam Dawson: Referral, Self ?Treating Zaire Levesque/Extender: Jeri Cos ?Weeks in Treatment: 0 ?Encounter Discharge Information Items ?Discharge Condition: Stable ?Ambulatory Status: Wheelchair ?Discharge Destination: Home ?Transportation: Private Auto ?Accompanied By: husband ?Schedule Follow-up Appointment: Yes ?Clinical Summary of Care: ?Electronic Signature(s) ?Signed: 05/18/2021 4:25:42 PM By: Levora Dredge ?Entered By: Levora Dredge on 05/18/2021 16:25:42 ?Hach, WINDIE MARASCO. ([id number]) ?-------------------------------------------------------------------------------- ?Lower Extremity Assessment Details ?Patient Name: Salmon, [name] ?Date of Service: 05/18/2021 12:45 PM ?Medical Record Number: [id number] ?Patient Account Number: [id number] ?Date of Birth/Sex: April 09, 1970 (51 y.o. F) ?Treating RN: Levora Dredge ?Primary Care Arlena Marsan: Margurite Auerbach Other Clinician: ?Referring Robie Oats: Referral, Self ?Treating Joushua Dugar/Extender: Jeri Cos ?Weeks in Treatment: 0 ?Edema Assessment ?Assessed: [Left: No] [Right: No] ?Edema: [Left: Ye] [Right: s] ?Calf ?Left: Right: ?Point of Measurement: 32 cm From Medial Instep 38 cm ?Ankle ?Left: Right: ?Point of Measurement: 10 cm From Medial Instep 23.5 cm ?Vascular Assessment ?Pulses: ?Dorsalis Pedis ?Doppler Audible: [Right:Yes] ?Notes ?ABI non compressible on the right ?Electronic Signature(s) ?Signed: 05/18/2021 4:31:50 PM By: Levora Dredge ?Entered By: Levora Dredge on 05/18/2021 13:31:36 ?Hendrickson, SIVAN CUELLO. ([id number]) ?-------------------------------------------------------------------------------- ?Multi Wound Chart Details ?Patient Name: Sean, PEACHES VANOVERBEKE. ?Date of  Service: 05/18/2021 12:45 PM ?Medical Record Number: [id number] ?Patient Account Number: [id number] ?Date of Birth/Sex: 03-01-1970 (51 y.o. F) ?Treating RN: Levora Dredge ?Primary Care Aleysia Oltmann: Margurite Auerbach Other

## 2021-05-22 DIAGNOSIS — Z992 Dependence on renal dialysis: Secondary | ICD-10-CM | POA: Diagnosis not present

## 2021-05-22 DIAGNOSIS — Z5181 Encounter for therapeutic drug level monitoring: Secondary | ICD-10-CM | POA: Diagnosis not present

## 2021-05-22 DIAGNOSIS — Z792 Long term (current) use of antibiotics: Secondary | ICD-10-CM | POA: Diagnosis not present

## 2021-05-22 DIAGNOSIS — J449 Chronic obstructive pulmonary disease, unspecified: Secondary | ICD-10-CM | POA: Diagnosis not present

## 2021-05-22 DIAGNOSIS — L989 Disorder of the skin and subcutaneous tissue, unspecified: Secondary | ICD-10-CM | POA: Diagnosis not present

## 2021-05-22 DIAGNOSIS — N186 End stage renal disease: Secondary | ICD-10-CM | POA: Diagnosis not present

## 2021-05-22 DIAGNOSIS — M869 Osteomyelitis, unspecified: Secondary | ICD-10-CM | POA: Diagnosis not present

## 2021-05-22 DIAGNOSIS — L97509 Non-pressure chronic ulcer of other part of unspecified foot with unspecified severity: Secondary | ICD-10-CM | POA: Diagnosis not present

## 2021-05-24 DIAGNOSIS — N186 End stage renal disease: Secondary | ICD-10-CM | POA: Diagnosis not present

## 2021-05-24 DIAGNOSIS — L97509 Non-pressure chronic ulcer of other part of unspecified foot with unspecified severity: Secondary | ICD-10-CM | POA: Diagnosis not present

## 2021-05-24 DIAGNOSIS — Z992 Dependence on renal dialysis: Secondary | ICD-10-CM | POA: Diagnosis not present

## 2021-05-24 DIAGNOSIS — L989 Disorder of the skin and subcutaneous tissue, unspecified: Secondary | ICD-10-CM | POA: Diagnosis not present

## 2021-05-25 ENCOUNTER — Encounter: Payer: Self-pay | Admitting: Oncology

## 2021-05-25 DIAGNOSIS — E1122 Type 2 diabetes mellitus with diabetic chronic kidney disease: Secondary | ICD-10-CM | POA: Diagnosis not present

## 2021-05-25 DIAGNOSIS — E11621 Type 2 diabetes mellitus with foot ulcer: Secondary | ICD-10-CM | POA: Diagnosis not present

## 2021-05-25 DIAGNOSIS — Z9981 Dependence on supplemental oxygen: Secondary | ICD-10-CM | POA: Diagnosis not present

## 2021-05-25 DIAGNOSIS — Z992 Dependence on renal dialysis: Secondary | ICD-10-CM | POA: Diagnosis not present

## 2021-05-25 DIAGNOSIS — L97512 Non-pressure chronic ulcer of other part of right foot with fat layer exposed: Secondary | ICD-10-CM | POA: Diagnosis not present

## 2021-05-25 DIAGNOSIS — D631 Anemia in chronic kidney disease: Secondary | ICD-10-CM | POA: Diagnosis not present

## 2021-05-25 DIAGNOSIS — N186 End stage renal disease: Secondary | ICD-10-CM | POA: Diagnosis not present

## 2021-05-25 DIAGNOSIS — I132 Hypertensive heart and chronic kidney disease with heart failure and with stage 5 chronic kidney disease, or end stage renal disease: Secondary | ICD-10-CM | POA: Diagnosis not present

## 2021-05-25 DIAGNOSIS — E1151 Type 2 diabetes mellitus with diabetic peripheral angiopathy without gangrene: Secondary | ICD-10-CM | POA: Diagnosis not present

## 2021-05-25 DIAGNOSIS — I5032 Chronic diastolic (congestive) heart failure: Secondary | ICD-10-CM | POA: Diagnosis not present

## 2021-05-25 DIAGNOSIS — Z794 Long term (current) use of insulin: Secondary | ICD-10-CM | POA: Diagnosis not present

## 2021-05-26 DIAGNOSIS — L989 Disorder of the skin and subcutaneous tissue, unspecified: Secondary | ICD-10-CM | POA: Diagnosis not present

## 2021-05-26 DIAGNOSIS — B951 Streptococcus, group B, as the cause of diseases classified elsewhere: Secondary | ICD-10-CM | POA: Diagnosis not present

## 2021-05-26 DIAGNOSIS — M869 Osteomyelitis, unspecified: Secondary | ICD-10-CM | POA: Diagnosis not present

## 2021-05-26 DIAGNOSIS — M86171 Other acute osteomyelitis, right ankle and foot: Secondary | ICD-10-CM | POA: Diagnosis not present

## 2021-05-26 DIAGNOSIS — J45909 Unspecified asthma, uncomplicated: Secondary | ICD-10-CM | POA: Diagnosis not present

## 2021-05-26 DIAGNOSIS — N186 End stage renal disease: Secondary | ICD-10-CM | POA: Diagnosis not present

## 2021-05-26 DIAGNOSIS — B9689 Other specified bacterial agents as the cause of diseases classified elsewhere: Secondary | ICD-10-CM | POA: Diagnosis not present

## 2021-05-26 DIAGNOSIS — I5040 Unspecified combined systolic (congestive) and diastolic (congestive) heart failure: Secondary | ICD-10-CM | POA: Diagnosis not present

## 2021-05-26 DIAGNOSIS — Z992 Dependence on renal dialysis: Secondary | ICD-10-CM | POA: Diagnosis not present

## 2021-05-26 DIAGNOSIS — B952 Enterococcus as the cause of diseases classified elsewhere: Secondary | ICD-10-CM | POA: Diagnosis not present

## 2021-05-28 ENCOUNTER — Encounter: Payer: Medicare Other | Attending: Physician Assistant | Admitting: Physician Assistant

## 2021-05-28 DIAGNOSIS — E1122 Type 2 diabetes mellitus with diabetic chronic kidney disease: Secondary | ICD-10-CM | POA: Insufficient documentation

## 2021-05-28 DIAGNOSIS — L97514 Non-pressure chronic ulcer of other part of right foot with necrosis of bone: Secondary | ICD-10-CM | POA: Insufficient documentation

## 2021-05-28 DIAGNOSIS — I110 Hypertensive heart disease with heart failure: Secondary | ICD-10-CM | POA: Diagnosis not present

## 2021-05-28 DIAGNOSIS — Z992 Dependence on renal dialysis: Secondary | ICD-10-CM | POA: Diagnosis not present

## 2021-05-28 DIAGNOSIS — I5042 Chronic combined systolic (congestive) and diastolic (congestive) heart failure: Secondary | ICD-10-CM | POA: Diagnosis not present

## 2021-05-28 DIAGNOSIS — I132 Hypertensive heart and chronic kidney disease with heart failure and with stage 5 chronic kidney disease, or end stage renal disease: Secondary | ICD-10-CM | POA: Diagnosis not present

## 2021-05-28 DIAGNOSIS — M86371 Chronic multifocal osteomyelitis, right ankle and foot: Secondary | ICD-10-CM | POA: Diagnosis not present

## 2021-05-28 DIAGNOSIS — Z9981 Dependence on supplemental oxygen: Secondary | ICD-10-CM | POA: Insufficient documentation

## 2021-05-28 DIAGNOSIS — N186 End stage renal disease: Secondary | ICD-10-CM | POA: Insufficient documentation

## 2021-05-28 DIAGNOSIS — E11621 Type 2 diabetes mellitus with foot ulcer: Secondary | ICD-10-CM | POA: Insufficient documentation

## 2021-05-28 DIAGNOSIS — E1169 Type 2 diabetes mellitus with other specified complication: Secondary | ICD-10-CM | POA: Diagnosis not present

## 2021-05-28 DIAGNOSIS — L97512 Non-pressure chronic ulcer of other part of right foot with fat layer exposed: Secondary | ICD-10-CM | POA: Diagnosis not present

## 2021-05-28 NOTE — Progress Notes (Addendum)
Mancusi, MILEIDY ATKIN. ([id number]) ?Visit Report for 05/28/2021 ?Chief Complaint Document Details ?Patient Name: Fuerte, LABRESHA MELLOR. ?Date of Service: 05/28/2021 10:30 AM ?Medical Record Number: [id number] ?Patient Account Number: [id number] ?Date of Birth/Sex: [birth year] (51 y.o. F) ?Treating RN: Levora Dredge ?Primary Care Provider: Margurite Auerbach Other Clinician: ?Referring Provider: Margurite Auerbach ?Treating Provider/Extender: Jeri Cos ?Weeks in Treatment: 1 ?Information Obtained from: Patient ?Chief Complaint ?Right foot gangrene and osteomyelitis ?Electronic Signature(s) ?Signed: 05/28/2021 11:19:33 AM By: Worthy Keeler PA-C ?Entered By: Worthy Keeler on 05/28/2021 11:19:32 ?Cryan, METZTLI SACHDEV. ([id number]) ?-------------------------------------------------------------------------------- ?Debridement Details ?Patient Name: Spinella, ANASTASIJA ANFINSON. ?Date of Service: 05/28/2021 10:30 AM ?Medical Record Number: [id number] ?Patient Account Number: [id number] ?Date of Birth/Sex: 1970-06-13 (51 y.o. F) ?Treating RN: Levora Dredge ?Primary Care Provider: Margurite Auerbach Other Clinician: ?Referring Provider: Margurite Auerbach ?Treating Provider/Extender: Jeri Cos ?Weeks in Treatment: 1 ?Debridement Performed for ?Wound #2 Right,Circumferential Foot ?Assessment: ?Performed By: Physician Tommie Sams., PA-C ?Debridement Type: Debridement ?Severity of Tissue Pre Debridement: Fat layer exposed ?Level of Consciousness (Pre- ?Awake and Alert ?procedure): ?Pre-procedure Verification/Time Out ?Yes - 11:27 ?Taken: ?Total Area Debrided (L x W): 5.5 (cm) x 4 (cm) = 22 (cm?) ?Tissue and other material ?Viable, Non-Viable, [place], [place], Subcutaneous, [place] ?debrided: ?Level: Skin/Subcutaneous Tissue ?Debridement Description: Excisional ?Instrument: Forceps, Scissors ?Bleeding: Minimum ?Hemostasis Achieved: Pressure ?Response to Treatment: Procedure was tolerated well ?Level of Consciousness (Post- ?Awake and Alert ?procedure): ?Post  Debridement Measurements of Total Wound ?Length: (cm) 5.5 ?Width: (cm) 4 ?Depth: (cm) 1.3 ?Volume: (cm?) 22.462 ?Character of Wound/Ulcer Post Debridement: Stable ?Severity of Tissue Post Debridement: Fat layer exposed ?Post Procedure Diagnosis ?Same as Pre-procedure ?Electronic Signature(s) ?Signed: 05/28/2021 4:14:46 PM By: Worthy Keeler PA-C ?Signed: 05/28/2021 4:40:06 PM By: Levora Dredge ?Entered By: Levora Dredge on 05/28/2021 11:30:53 ?Szeto, FERN CANOVA. ([id number]) ?-------------------------------------------------------------------------------- ?HPI Details ?Patient Name: Grainger, KESSIE CROSTON. ?Date of Service: 05/28/2021 10:30 AM ?Medical Record Number: [id number] ?Patient Account Number: [id number] ?Date of Birth/Sex: 10-01-1970 (51 y.o. F) ?Treating RN: Levora Dredge ?Primary Care Provider: Margurite Auerbach Other Clinician: ?Referring Provider: Margurite Auerbach ?Treating Provider/Extender: Jeri Cos ?Weeks in Treatment: 1 ?History of Present Illness ?HPI Description: 10/04/2020 upon evaluation today patient presents for initial inspection here in the clinic concerning a fairly concerning wound ?over her right fifth toe towards the medial aspect which actually probes all the way down to bone. There is evidence of necrotic muscle as well and ?I do see tendon exposed as well. Subsequently this does have me very concerned about where things stand here. Again this is the first time that I ?am seeing her and on top of everything else her ABIs are noncompressible. I think that arterial flow is the primary concern here. ?Of note the patient states this was first noted July 1. She had a x-ray on July 4 which was negative for bone infection. She has been on 2 ?antibiotics she is unsure of the name. With that being said her most recent hemoglobin A1c was 8.1 that was December 2022. She otherwise does ?have a history obviously of diabetes mellitus type 2, hypertension, end-stage renal disease with dependence on renal  dialysis, and she is also ?dependent on supplemental oxygen. ?Readmission: ?05/14/2021 this is a patient whom I previously seen October 04, 2020. With that being said she is subsequently on 21 October 2020 ended up ?having an amputation of her fifth digit of her foot unfortunately. This had progressed fairly quickly into what sounds to been gangrene at that ?time. Subsequently right now based on  what I am seeing the patient actually has a necrotic area on the end of her foot and has been ?recommended that the only option really here is going to be a amputation which would be a below-knee amputation. She is really not interested in ?that and wants to try to give this every chance it can to heal. For that reason I discussed with her that definitely we can see what we can do to try ?to improve this situation and try to help get this doing better as best we can. With that being said there is definitely a portion of her wound that is ?just not good to be able to heal no matter what it is already starting to auto amputate. This includes potentially digits of her foot as well based on ?what I am seeing. She does have confirmed osteomyelitis noted by MRI. She is also currently on IV vancomycin. She does undergo dialysis on ?Tuesday, Thursday, and Saturday. ?The patient does have a history of diabetes mellitus type 2, congestive heart failure, hypertension, end-stage renal disease, dependence on renal ?dialysis, and dependence on supplemental oxygen. With that being said I am going to need to look into her heart failure and the significance of ?this as far as hyperbarics is concerned before we initiate treatment. She is probably also going to need an updated EKG and chest x-ray unless its ?already been done recently. ?05/28/2021 upon evaluation today patient appears to be doing well with regard to her wound all things considered. I am going to try to see what we ?can do about trimmed away some of the necrotic tissue that is  really just hanging on here and there and not really helpful. Feel like if we can ?slowly start doing this maybe we will be able to get to the point where this is better and better as far as cleaning up the surface of the wound is ?concerned. Still as I explained I do believe some of her toes are not salvageable but again she is looking more towards autoamputation versus ?want to do anything definitive here from a surgical standpoint. I have been researching whether she would be an okay candidate for hyperbarics ?or not and I found that her x-ray but she had a chest x-ray recently was negative for any acute disease and appear to be stable. EKG was also ?stable. She did also have a cardiac echo which showed that her ejection fraction was 60 to 65% and therefore I think she is a candidate for ?hyperbarics. Again I do believe that this can help with preventing amputation which right now she has been told that her only option would be a ?below-knee amputation. Nonetheless I think that if we can get the heel leading to spread and the majority of this cleared away that it is possible ?we might even be able to get a surgeon just to surgically remove the nonviable toes and not have to go the full way of complete amputation. ?Nonetheless we do have a ways to go before we will get to that point. Either way I think the hyperbarics could be an excellent support as far as ?that is concerned. ?Electronic Signature(s) ?Signed: 05/28/2021 2:10:18 PM By: Worthy Keeler PA-C ?Entered By: Worthy Keeler on 05/28/2021 14:10:18 ?[place], PENNELOPE BASQUE. ([id number]) ?-------------------------------------------------------------------------------- ?Physical Exam Details ?Patient Name: Hase, AMALEA OTTEY. ?Date of Service: 05/28/2021 10:30 AM ?Medical Record Number: [id number] ?Patient Account Number: [id number] ?Date of Birth/Sex: 04/26/1970 (51 y.o. F) ?Treating RN: Levora Dredge ?Primary Care  Provider: Margurite Auerbach Other Clinician: ?Referring  Provider: Margurite Auerbach ?Treating Provider/Extender: Jeri Cos ?Weeks in Treatment: 1 ?Constitutional ?Well-nourished and well-hydrated in no acute distress. ?Respiratory ?normal breathing without difficulty. ?Psych

## 2021-05-28 NOTE — Progress Notes (Signed)
Kropp, KEMIA WENDEL. ([id number]) ?Visit Report for 05/28/2021 ?HBO Patient Questionnaire Details ?Patient Name: [name], [name] ?Date of Service: 05/28/2021 10:30 AM ?Medical Record Number: [id number] ?Patient Account Number: [id number] ?Date of Birth/Sex: Feb 17, 1971 (51 y.o. F) ?Treating RN: Levora Dredge ?Primary Care Tydarius Yawn: Margurite Auerbach Other Clinician: ?Referring Shantina Chronister: Margurite Auerbach ?Treating Yong Wahlquist/Extender: Jeri Cos ?Weeks in Treatment: 1 ?HBO Patient Questionnaire Items ?Answer ?Any "Yes" answers must be brought to the hyperbaric physician's attention. ?Breathing or Lung problemso Yes ?Describe: continuous O2 ?Seen a lung doctor for any breathing or lung problemso Yes ?If yes, provide the name of doctor: Orson Gear ?Currently use tobacco productso No ?Used tobacco products in the pasto No ?Heart problemso Yes ?Seen a doctor for any heart or blood pressure problemso Yes ?Do you take water pills (diuretic)o No ?Diabeteso Yes ?On Diabetes pillo No ?On Insulino Yes ?Dialysiso Yes ?Eye problems like glaucomao No ?Ear problems or surgeryo No ?Sinus Problemso Yes ?Cancero No ?Confinement Anxiety (Claustrophobia- fear of confined places)o Yes ?Any medical implants/devices that are fully or partially implanted or attached to your bodyo No ?Pregnanto No ?Seizureso No ?Date of last: ?Chest x-ray: 04/21/2021 ?EKG: 04/24/2021 ?CBC: 05/01/2021 ?Electronic Signature(s) ?Signed: 05/28/2021 4:36:57 PM By: Levora Dredge ?Entered By: Levora Dredge on 05/28/2021 16:36:57 ?

## 2021-05-28 NOTE — Progress Notes (Signed)
Correia, MIRABEL AHLGREN. ([id number]) ?Visit Report for 05/28/2021 ?Arrival Information Details ?Patient Name: Elza, ANGALA HILGERS. ?Date of Service: 05/28/2021 10:30 AM ?Medical Record Number: [id number] ?Patient Account Number: [id number] ?Date of Birth/Sex: [birth year] (51 y.o. F) ?Treating RN: Levora Dredge ?Primary Care Zakeria Kulzer: Margurite Auerbach Other Clinician: ?Referring Zakaiya Lares: Margurite Auerbach ?Treating Ryn Peine/Extender: Jeri Cos ?Weeks in Treatment: 1 ?Visit Information History Since Last Visit ?Added or deleted any medications: No ?Patient Arrived: Wheel Chair ?Any new allergies or adverse reactions: No ?Arrival Time: 10:44 ?Had a fall or experienced change in No ?Accompanied By: husband ?activities of daily living that may affect ?Transfer Assistance: EasyPivot Patient Lift ?risk of falls: ?Hospitalized since last visit: No ?Has Dressing in Place as Prescribed: Yes ?Pain Present Now: Yes ?Electronic Signature(s) ?Signed: 05/28/2021 4:40:06 PM By: Levora Dredge ?Entered By: Levora Dredge on 05/28/2021 10:46:04 ?Civello, INICE SANLUIS. ([id number]) ?-------------------------------------------------------------------------------- ?Clinic Level of Care Assessment Details ?Patient Name: Feehan, ZION LINT. ?Date of Service: 05/28/2021 10:30 AM ?Medical Record Number: [id number] ?Patient Account Number: [id number] ?Date of Birth/Sex: 1970-05-17 (51 y.o. F) ?Treating RN: Levora Dredge ?Primary Care Allsion Nogales: Margurite Auerbach Other Clinician: ?Referring Bradyn Vassey: Margurite Auerbach ?Treating Randee Huston/Extender: Jeri Cos ?Weeks in Treatment: 1 ?Clinic Level of Care Assessment Items ?TOOL 4 Quantity Score ?'[]'$  - Use when only an EandM is performed on FOLLOW-UP visit 0 ?ASSESSMENTS - Nursing Assessment / Reassessment ?'[]'$  - Reassessment of Co-morbidities (includes updates in patient status) 0 ?X- 1 5 ?Reassessment of Adherence to Treatment Plan ?ASSESSMENTS - Wound and Skin Assessment / Reassessment ?X - Simple Wound Assessment /  Reassessment - one wound 1 5 ?'[]'$  - 0 ?Complex Wound Assessment / Reassessment - multiple wounds ?'[]'$  - 0 ?Dermatologic / Skin Assessment (not related to wound area) ?ASSESSMENTS - Focused Assessment ?'[]'$  - Circumferential Edema Measurements - multi extremities 0 ?'[]'$  - 0 ?Nutritional Assessment / Counseling / Intervention ?'[]'$  - 0 ?Lower Extremity Assessment (monofilament, tuning fork, pulses) ?'[]'$  - 0 ?Peripheral Arterial Disease Assessment (using hand held doppler) ?ASSESSMENTS - Ostomy and/or Continence Assessment and Care ?'[]'$  - Incontinence Assessment and Management 0 ?'[]'$  - 0 ?Ostomy Care Assessment and Management (repouching, etc.) ?PROCESS - Coordination of Care ?X - Simple Patient / Family Education for ongoing care 1 15 ?'[]'$  - 0 ?Complex (extensive) Patient / Family Education for ongoing care ?'[]'$  - 0 ?Staff obtains Consents, Records, Test Results / Process Orders ?'[]'$  - 0 ?Staff telephones HHA, Nursing Homes / Clarify orders / etc ?'[]'$  - 0 ?Routine Transfer to another Facility (non-emergent condition) ?'[]'$  - 0 ?Routine Hospital Admission (non-emergent condition) ?'[]'$  - 0 ?New Admissions / Biomedical engineer / Ordering NPWT, Apligraf, etc. ?'[]'$  - 0 ?Emergency Hospital Admission (emergent condition) ?X- 1 10 ?Simple Discharge Coordination ?'[]'$  - 0 ?Complex (extensive) Discharge Coordination ?PROCESS - Special Needs ?'[]'$  - Pediatric / Minor Patient Management 0 ?'[]'$  - 0 ?Isolation Patient Management ?'[]'$  - 0 ?Hearing / Language / Visual special needs ?'[]'$  - 0 ?Assessment of Community assistance (transportation, D/C planning, etc.) ?'[]'$  - 0 ?Additional assistance / Altered mentation ?'[]'$  - 0 ?Support Surface(s) Assessment (bed, cushion, seat, etc.) ?INTERVENTIONS - Wound Cleansing / Measurement ?Fountaine, BERNADINE MELECIO. ([id number]) ?X- 1 5 ?Simple Wound Cleansing - one wound ?'[]'$  - 0 ?Complex Wound Cleansing - multiple wounds ?X- 1 5 ?Wound Imaging (photographs - any number of wounds) ?'[]'$  - 0 ?Wound Tracing (instead of  photographs) ?X- 1 5 ?Simple Wound Measurement - one wound ?'[]'$  - 0 ?Complex Wound Measurement - multiple wounds ?INTERVENTIONS - Wound Dressings ?X -  Small Wound Dressing one or multiple wounds 1 10 ?'[]'$  - 0 ?Medium Wound Dressing one or multiple wounds ?'[]'$  - 0 ?Large Wound Dressing one or multiple wounds ?'[]'$  - 0 ?Application of Medications - topical ?'[]'$  - 0 ?Application of Medications - injection ?INTERVENTIONS - Miscellaneous ?'[]'$  - External ear exam 0 ?'[]'$  - 0 ?Specimen Collection (cultures, biopsies, blood, body fluids, etc.) ?'[]'$  - 0 ?Specimen(s) / Culture(s) sent or taken to Lab for analysis ?'[]'$  - 0 ?Patient Transfer (multiple staff / Civil Service fast streamer / Similar devices) ?'[]'$  - 0 ?Simple Staple / Suture removal (25 or less) ?'[]'$  - 0 ?Complex Staple / Suture removal (26 or more) ?'[]'$  - 0 ?Hypo / Hyperglycemic Management (close monitor of Blood Glucose) ?'[]'$  - 0 ?Ankle / Brachial Index (ABI) - do not check if billed separately ?X- 1 5 ?Vital Signs ?Has the patient been seen at the hospital within the last three years: Yes ?Total Score: 65 ?Level Of Care: New/Established - Level ?2 ?Electronic Signature(s) ?Signed: 05/28/2021 4:40:06 PM By: Levora Dredge ?Entered By: Levora Dredge on 05/28/2021 16:29:43 ?Gadbois, KERRILYN AZBILL. ([id number]) ?-------------------------------------------------------------------------------- ?Encounter Discharge Information Details ?Patient Name: Affeldt, JAILINE LIEDER. ?Date of Service: 05/28/2021 10:30 AM ?Medical Record Number: [id number] ?Patient Account Number: [id number] ?Date of Birth/Sex: 1971-02-23 (51 y.o. F) ?Treating RN: Levora Dredge ?Primary Care Rosette Bellavance: Margurite Auerbach Other Clinician: ?Referring Conner Muegge: Margurite Auerbach ?Treating Terin Cragle/Extender: Jeri Cos ?Weeks in Treatment: 1 ?Encounter Discharge Information Items Post Procedure Vitals ?Discharge Condition: Stable ?Temperature (?F): 97.7 ?Ambulatory Status: Wheelchair ?Pulse (bpm): 73 ?Discharge Destination: Home ?Respiratory Rate  (breaths/min): 18 ?Transportation: Private Auto ?Blood Pressure (mmHg): 153/99 ?Accompanied By: husband ?Schedule Follow-up Appointment: Yes ?Clinical Summary of Care: ?Electronic Signature(s) ?Signed: 05/28/2021 4:31:49 PM By: Levora Dredge ?Entered By: Levora Dredge on 05/28/2021 16:31:49 ?Maltos, JESTINE BICKNELL. ([id number]) ?-------------------------------------------------------------------------------- ?Lower Extremity Assessment Details ?Patient Name: Gosling, ATTALLAH ONTKO. ?Date of Service: 05/28/2021 10:30 AM ?Medical Record Number: [id number] ?Patient Account Number: [id number] ?Date of Birth/Sex: 07-29-1970 (51 y.o. F) ?Treating RN: Levora Dredge ?Primary Care Carolin Quang: Margurite Auerbach Other Clinician: ?Referring Cloyd Ragas: Margurite Auerbach ?Treating Arnulfo Batson/Extender: Jeri Cos ?Weeks in Treatment: 1 ?Edema Assessment ?Assessed: [Left: No] [Right: No] ?Edema: [Left: Ye] [Right: s] ?Calf ?Left: Right: ?Point of Measurement: 32 cm From Medial Instep 37.5 cm ?Ankle ?Left: Right: ?Point of Measurement: 10 cm From Medial Instep 25 cm ?Vascular Assessment ?Pulses: ?Dorsalis Pedis ?Palpable: [Right:Yes] ?Electronic Signature(s) ?Signed: 05/28/2021 4:40:06 PM By: Levora Dredge ?Entered By: Levora Dredge on 05/28/2021 11:05:15 ?Vessell, KELLYANN ORDWAY. ([id number]) ?-------------------------------------------------------------------------------- ?Multi Wound Chart Details ?Patient Name: Mcclaskey, FIONA COTO. ?Date of Service: 05/28/2021 10:30 AM ?Medical Record Number: [id number] ?Patient Account Number: [id number] ?Date of Birth/Sex: [birth year] (51 y.o. F) ?Treating RN: Levora Dredge ?Primary Care Nathaneil Feagans: Margurite Auerbach Other Clinician: ?Referring Damaris Geers: Margurite Auerbach ?Treating Dagon Budai/Extender: Jeri Cos ?Weeks in Treatment: 1 ?Vital Signs ?Height(in): 64 ?Pulse(bpm): 73 ?Weight(lbs): 259 ?Blood Pressure(mmHg): 153/99 ?Body Mass Index(BMI): 44.5 ?Temperature(??F): 97.7 ?Respiratory Rate(breaths/min): 18 ?Photos:  [N/A:N/A] ?Wound Location: Right, Circumferential Foot N/A N/A ?Wounding Event: Gradually Appeared N/A N/A ?Primary Etiology: Diabetic Wound/Ulcer of the Lower N/A N/A ?Extremity ?Comorbid History: Cataracts, Anemia, Asthma, Chronic N/

## 2021-05-29 DIAGNOSIS — B9689 Other specified bacterial agents as the cause of diseases classified elsewhere: Secondary | ICD-10-CM | POA: Diagnosis not present

## 2021-05-29 DIAGNOSIS — B951 Streptococcus, group B, as the cause of diseases classified elsewhere: Secondary | ICD-10-CM | POA: Diagnosis not present

## 2021-05-29 DIAGNOSIS — Z992 Dependence on renal dialysis: Secondary | ICD-10-CM | POA: Diagnosis not present

## 2021-05-29 DIAGNOSIS — N186 End stage renal disease: Secondary | ICD-10-CM | POA: Diagnosis not present

## 2021-05-29 DIAGNOSIS — B952 Enterococcus as the cause of diseases classified elsewhere: Secondary | ICD-10-CM | POA: Diagnosis not present

## 2021-05-29 DIAGNOSIS — M86171 Other acute osteomyelitis, right ankle and foot: Secondary | ICD-10-CM | POA: Diagnosis not present

## 2021-05-29 DIAGNOSIS — L989 Disorder of the skin and subcutaneous tissue, unspecified: Secondary | ICD-10-CM | POA: Diagnosis not present

## 2021-05-29 DIAGNOSIS — M869 Osteomyelitis, unspecified: Secondary | ICD-10-CM | POA: Diagnosis not present

## 2021-05-30 DIAGNOSIS — E1151 Type 2 diabetes mellitus with diabetic peripheral angiopathy without gangrene: Secondary | ICD-10-CM | POA: Diagnosis not present

## 2021-05-30 DIAGNOSIS — Z9981 Dependence on supplemental oxygen: Secondary | ICD-10-CM | POA: Diagnosis not present

## 2021-05-30 DIAGNOSIS — I5032 Chronic diastolic (congestive) heart failure: Secondary | ICD-10-CM | POA: Diagnosis not present

## 2021-05-30 DIAGNOSIS — I132 Hypertensive heart and chronic kidney disease with heart failure and with stage 5 chronic kidney disease, or end stage renal disease: Secondary | ICD-10-CM | POA: Diagnosis not present

## 2021-05-30 DIAGNOSIS — Z992 Dependence on renal dialysis: Secondary | ICD-10-CM | POA: Diagnosis not present

## 2021-05-30 DIAGNOSIS — Z794 Long term (current) use of insulin: Secondary | ICD-10-CM | POA: Diagnosis not present

## 2021-05-30 DIAGNOSIS — L97512 Non-pressure chronic ulcer of other part of right foot with fat layer exposed: Secondary | ICD-10-CM | POA: Diagnosis not present

## 2021-05-30 DIAGNOSIS — E1122 Type 2 diabetes mellitus with diabetic chronic kidney disease: Secondary | ICD-10-CM | POA: Diagnosis not present

## 2021-05-30 DIAGNOSIS — N186 End stage renal disease: Secondary | ICD-10-CM | POA: Diagnosis not present

## 2021-05-30 DIAGNOSIS — D631 Anemia in chronic kidney disease: Secondary | ICD-10-CM | POA: Diagnosis not present

## 2021-05-30 DIAGNOSIS — E11621 Type 2 diabetes mellitus with foot ulcer: Secondary | ICD-10-CM | POA: Diagnosis not present

## 2021-05-31 ENCOUNTER — Ambulatory Visit: Payer: Medicare Other | Admitting: Adult Health

## 2021-05-31 DIAGNOSIS — B951 Streptococcus, group B, as the cause of diseases classified elsewhere: Secondary | ICD-10-CM | POA: Diagnosis not present

## 2021-05-31 DIAGNOSIS — M869 Osteomyelitis, unspecified: Secondary | ICD-10-CM | POA: Diagnosis not present

## 2021-05-31 DIAGNOSIS — N186 End stage renal disease: Secondary | ICD-10-CM | POA: Diagnosis not present

## 2021-05-31 DIAGNOSIS — B952 Enterococcus as the cause of diseases classified elsewhere: Secondary | ICD-10-CM | POA: Diagnosis not present

## 2021-05-31 DIAGNOSIS — B9689 Other specified bacterial agents as the cause of diseases classified elsewhere: Secondary | ICD-10-CM | POA: Diagnosis not present

## 2021-05-31 DIAGNOSIS — Z992 Dependence on renal dialysis: Secondary | ICD-10-CM | POA: Diagnosis not present

## 2021-05-31 DIAGNOSIS — L989 Disorder of the skin and subcutaneous tissue, unspecified: Secondary | ICD-10-CM | POA: Diagnosis not present

## 2021-05-31 DIAGNOSIS — M86171 Other acute osteomyelitis, right ankle and foot: Secondary | ICD-10-CM | POA: Diagnosis not present

## 2021-06-02 DIAGNOSIS — M86171 Other acute osteomyelitis, right ankle and foot: Secondary | ICD-10-CM | POA: Diagnosis not present

## 2021-06-02 DIAGNOSIS — L989 Disorder of the skin and subcutaneous tissue, unspecified: Secondary | ICD-10-CM | POA: Diagnosis not present

## 2021-06-02 DIAGNOSIS — B9689 Other specified bacterial agents as the cause of diseases classified elsewhere: Secondary | ICD-10-CM | POA: Diagnosis not present

## 2021-06-02 DIAGNOSIS — N186 End stage renal disease: Secondary | ICD-10-CM | POA: Diagnosis not present

## 2021-06-02 DIAGNOSIS — B952 Enterococcus as the cause of diseases classified elsewhere: Secondary | ICD-10-CM | POA: Diagnosis not present

## 2021-06-02 DIAGNOSIS — M869 Osteomyelitis, unspecified: Secondary | ICD-10-CM | POA: Diagnosis not present

## 2021-06-02 DIAGNOSIS — Z992 Dependence on renal dialysis: Secondary | ICD-10-CM | POA: Diagnosis not present

## 2021-06-02 DIAGNOSIS — B951 Streptococcus, group B, as the cause of diseases classified elsewhere: Secondary | ICD-10-CM | POA: Diagnosis not present

## 2021-06-04 DIAGNOSIS — Z9981 Dependence on supplemental oxygen: Secondary | ICD-10-CM | POA: Diagnosis not present

## 2021-06-04 DIAGNOSIS — I5032 Chronic diastolic (congestive) heart failure: Secondary | ICD-10-CM | POA: Diagnosis not present

## 2021-06-04 DIAGNOSIS — N186 End stage renal disease: Secondary | ICD-10-CM | POA: Diagnosis not present

## 2021-06-04 DIAGNOSIS — Z992 Dependence on renal dialysis: Secondary | ICD-10-CM | POA: Diagnosis not present

## 2021-06-04 DIAGNOSIS — G4733 Obstructive sleep apnea (adult) (pediatric): Secondary | ICD-10-CM | POA: Diagnosis not present

## 2021-06-04 DIAGNOSIS — E1151 Type 2 diabetes mellitus with diabetic peripheral angiopathy without gangrene: Secondary | ICD-10-CM | POA: Diagnosis not present

## 2021-06-04 DIAGNOSIS — I132 Hypertensive heart and chronic kidney disease with heart failure and with stage 5 chronic kidney disease, or end stage renal disease: Secondary | ICD-10-CM | POA: Diagnosis not present

## 2021-06-04 DIAGNOSIS — D631 Anemia in chronic kidney disease: Secondary | ICD-10-CM | POA: Diagnosis not present

## 2021-06-04 DIAGNOSIS — E1122 Type 2 diabetes mellitus with diabetic chronic kidney disease: Secondary | ICD-10-CM | POA: Diagnosis not present

## 2021-06-04 DIAGNOSIS — E11621 Type 2 diabetes mellitus with foot ulcer: Secondary | ICD-10-CM | POA: Diagnosis not present

## 2021-06-04 DIAGNOSIS — L97512 Non-pressure chronic ulcer of other part of right foot with fat layer exposed: Secondary | ICD-10-CM | POA: Diagnosis not present

## 2021-06-04 DIAGNOSIS — Z794 Long term (current) use of insulin: Secondary | ICD-10-CM | POA: Diagnosis not present

## 2021-06-05 ENCOUNTER — Encounter: Payer: Medicare Other | Admitting: Physician Assistant

## 2021-06-05 DIAGNOSIS — Z792 Long term (current) use of antibiotics: Secondary | ICD-10-CM | POA: Diagnosis not present

## 2021-06-05 DIAGNOSIS — B951 Streptococcus, group B, as the cause of diseases classified elsewhere: Secondary | ICD-10-CM | POA: Diagnosis not present

## 2021-06-05 DIAGNOSIS — L0390 Cellulitis, unspecified: Secondary | ICD-10-CM | POA: Diagnosis not present

## 2021-06-05 DIAGNOSIS — I10 Essential (primary) hypertension: Secondary | ICD-10-CM | POA: Diagnosis not present

## 2021-06-05 DIAGNOSIS — Z992 Dependence on renal dialysis: Secondary | ICD-10-CM | POA: Diagnosis not present

## 2021-06-05 DIAGNOSIS — L989 Disorder of the skin and subcutaneous tissue, unspecified: Secondary | ICD-10-CM | POA: Diagnosis not present

## 2021-06-05 DIAGNOSIS — L97515 Non-pressure chronic ulcer of other part of right foot with muscle involvement without evidence of necrosis: Secondary | ICD-10-CM | POA: Diagnosis not present

## 2021-06-05 DIAGNOSIS — B952 Enterococcus as the cause of diseases classified elsewhere: Secondary | ICD-10-CM | POA: Diagnosis not present

## 2021-06-05 DIAGNOSIS — M869 Osteomyelitis, unspecified: Secondary | ICD-10-CM | POA: Diagnosis not present

## 2021-06-05 DIAGNOSIS — N186 End stage renal disease: Secondary | ICD-10-CM | POA: Diagnosis not present

## 2021-06-05 DIAGNOSIS — M86171 Other acute osteomyelitis, right ankle and foot: Secondary | ICD-10-CM | POA: Diagnosis not present

## 2021-06-05 DIAGNOSIS — L97512 Non-pressure chronic ulcer of other part of right foot with fat layer exposed: Secondary | ICD-10-CM | POA: Diagnosis not present

## 2021-06-05 DIAGNOSIS — E11621 Type 2 diabetes mellitus with foot ulcer: Secondary | ICD-10-CM | POA: Diagnosis not present

## 2021-06-05 DIAGNOSIS — Z5181 Encounter for therapeutic drug level monitoring: Secondary | ICD-10-CM | POA: Diagnosis not present

## 2021-06-05 DIAGNOSIS — B9689 Other specified bacterial agents as the cause of diseases classified elsewhere: Secondary | ICD-10-CM | POA: Diagnosis not present

## 2021-06-05 NOTE — Progress Notes (Addendum)
Tryon, CLAUDETT BAYLY. ([id number]) ?Visit Report for 06/05/2021 ?Chief Complaint Document Details ?Patient Name: [name] [name] ERB. ?Date of Service: 06/05/2021 2:45 PM ?Medical Record Number: [id number] ?Patient Account Number: [id number] ?Date of Birth/Sex: 1970/05/10 (51 y.o. F) ?Treating RN: Cornell Barman ?Primary Care Provider: Margurite Auerbach Other Clinician: ?Referring Provider: Margurite Auerbach ?Treating Provider/Extender: Jeri Cos ?Weeks in Treatment: 2 ?Information Obtained from: Patient ?Chief Complaint ?Right foot gangrene and osteomyelitis ?Electronic Signature(s) ?Signed: 06/05/2021 2:55:11 PM By: Worthy Keeler PA-C ?Entered By: Worthy Keeler on 06/05/2021 14:55:11 ?[name] [name] SELLERS. ([id number]) ?-------------------------------------------------------------------------------- ?HPI Details ?Patient Name: [name] [name] VEJAR. ?Date of Service: 06/05/2021 2:45 PM ?Medical Record Number: [id number] ?Patient Account Number: [id number] ?Date of Birth/Sex: 1971-02-02 (51 y.o. F) ?Treating RN: Cornell Barman ?Primary Care Provider: Margurite Auerbach Other Clinician: ?Referring Provider: Margurite Auerbach ?Treating Provider/Extender: Jeri Cos ?Weeks in Treatment: 2 ?History of Present Illness ?HPI Description: 10/04/2020 upon evaluation today patient presents for initial inspection here in the clinic concerning a fairly concerning wound ?over her right fifth toe towards the medial aspect which actually probes all the way down to bone. There is evidence of necrotic muscle as well and ?I do see tendon exposed as well. Subsequently this does have me very concerned about where things stand here. Again this is the first time that I ?am seeing her and on top of everything else her ABIs are noncompressible. I think that arterial flow is the primary concern here. ?Of note the patient states this was first noted July 1. She had a x-ray on July 4 which was negative for bone infection. She has been on 2 ?antibiotics she is unsure of the  name. With that being said her most recent hemoglobin A1c was 8.1 that was December 2022. She otherwise does ?have a history obviously of diabetes mellitus type 2, hypertension, end-stage renal disease with dependence on renal dialysis, and she is also ?dependent on supplemental oxygen. ?Readmission: ?05/14/2021 this is a patient whom I previously seen October 04, 2020. With that being said she is subsequently on 21 October 2020 ended up ?having an amputation of her fifth digit of her foot unfortunately. This had progressed fairly quickly into what sounds to been gangrene at that ?time. Subsequently right now based on what I am seeing the patient actually has a necrotic area on the end of her foot and has been ?recommended that the only option really here is going to be a amputation which would be a below-knee amputation. She is really not interested in ?that and wants to try to give this every chance it can to heal. For that reason I discussed with her that definitely we can see what we can do to try ?to improve this situation and try to help get this doing better as best we can. With that being said there is definitely a portion of her wound that is ?just not good to be able to heal no matter what it is already starting to auto amputate. This includes potentially digits of her foot as well based on ?what I am seeing. She does have confirmed osteomyelitis noted by MRI. She is also currently on IV vancomycin. She does undergo dialysis on ?Tuesday, Thursday, and Saturday. ?The patient does have a history of diabetes mellitus type 2, congestive heart failure, hypertension, end-stage renal disease, dependence on renal ?dialysis, and dependence on supplemental oxygen. With that being said I am going to need to look into her heart failure and the significance of ?this as far as hyperbarics  is concerned before we initiate treatment. She is probably also going to need an updated EKG and chest x-ray unless its ?already been  done recently. ?05/28/2021 upon evaluation today patient appears to be doing well with regard to her wound all things considered. I am going to try to see what we ?can do about trimmed away some of the necrotic tissue that is really just hanging on here and there and not really helpful. Feel like if we can ?slowly start doing this maybe we will be able to get to the point where this is better and better as far as cleaning up the surface of the wound is ?concerned. Still as I explained I do believe some of her toes are not salvageable but again she is looking more towards autoamputation versus ?want to do anything definitive here from a surgical standpoint. I have been researching whether she would be an okay candidate for hyperbarics ?or not and I found that her x-ray but she had a chest x-ray recently was negative for any acute disease and appear to be stable. EKG was also ?stable. She did also have a cardiac echo which showed that her ejection fraction was 60 to 65% and therefore I think she is a candidate for ?hyperbarics. Again I do believe that this can help with preventing amputation which right now she has been told that her only option would be a ?below-knee amputation. Nonetheless I think that if we can get the heel leading to spread and the majority of this cleared away that it is possible ?we might even be able to get a surgeon just to surgically remove the nonviable toes and not have to go the full way of complete amputation. ?Nonetheless we do have a ways to go before we will get to that point. Either way I think the hyperbarics could be an excellent support as far as ?that is concerned. ?06-05-2021 upon evaluation today patient's wound is showing some signs of improvement. This does seem to be slow and of course it is ?understandable considering what we are seeing. She has discussed and looked up information about hyperbarics and in the end comes back today ?and states that she does want to proceed. I  honestly do feel like that is her best chance at saving the foot. Even if we are able to get her into a ?surgeon after we obtain some good healing approximately so that there is not as much necrotic tissue in general she may even be able to have a ?transmetatarsal amputation and get this to heal. Again I am not saying this is can be an easy road but it may be her best road as far as thinking ?about how to achieve some type of complete healing as I do not believe the toes are viable at all to be honest. ?Electronic Signature(s) ?Signed: 06/05/2021 4:08:37 PM By: Worthy Keeler PA-C ?Entered By: Worthy Keeler on 06/05/2021 16:08:37 ?[name] [name] PULCINI. ([id number]) ?-------------------------------------------------------------------------------- ?Physical Exam Details ?Patient Name: [name] [name] WEATHERHOLTZ. ?Date of Service: 06/05/2021 2:45 PM ?Medical Record Number: [id number] ?Patient Account Number: [id number] ?Date of Birth/Sex: 10-14-1970 (51 y.o. F) ?Treating RN: Cornell Barman ?Primary Care Provider: Margurite Auerbach Other Clinician: ?Referring Provider: Margurite Auerbach ?Treating Provider/Extender: Jeri Cos ?Weeks in Treatment: 2 ?Constitutional ?Well-nourished and well-hydrated in no acute distress. ?Respiratory ?normal breathing without difficulty. ?Psychiatric ?this patient is able to make decisions and demonstrates good insight into disease process. Alert and Oriented x 3. pleasant and cooperative. ?Notes ?Upon inspection patient's  wound bed actually showed signs of good granulation and epithelization at this point in some areas proximally there still ?a lot of necrotic tissue including significant eschar and again little by little looking to trim this away some of this could come off around the edge ?today but again it is going to take quite a bit of time. I do believe the hyperbaric oxygen therapy would support her in her goal of trying to ?salvage not having to go forward with a below-knee amputation. ?Electronic  Signature(s) ?Signed: 06/05/2021 4:09:04 PM By: Worthy Keeler PA-C ?Entered By: Worthy Keeler on 06/05/2021 16:09:04 ?[name] [name] M. ([id number]) ?------------------------------------------------------------

## 2021-06-05 NOTE — Progress Notes (Addendum)
[place]au, MARIGENE ERLER. ([id number]) ?Visit Report for 06/05/2021 ?Arrival Information Details ?Patient Name: [name] ?Date of Service: 06/05/2021 2:45 PM ?Medical Record Number: [id number] ?Patient Account Number: [id number] ?Date of Birth/Sex: 28-Apr-1970 (51 y.o. F) ?Treating RN: Cornell Barman ?Primary Care Destiney Sanabia: Margurite Auerbach Other Clinician: ?Referring Alfons Sulkowski: Margurite Auerbach ?Treating Baylyn Sickles/Extender: Jeri Cos ?Weeks in Treatment: 2 ?Visit Information History Since Last Visit ?Added or deleted any medications: No ?Patient Arrived: Wheel Chair ?Has Dressing in Place as Prescribed: Yes ?Arrival Time: 14:54 ?Has Footwear/Offloading in Place as Prescribed: Yes ?Transfer Assistance: None ?Right: Surgical Shoe with Pressure Relief ?Patient Identification Verified: Yes ?Insole ?Secondary Verification Process Completed: Yes ?Pain Present Now: Yes ?Electronic Signature(s) ?Signed: 06/05/2021 4:59:50 PM By: Gretta Cool, BSN, RN, CWS, Kim RN, BSN ?Entered By: Gretta Cool, BSN, RN, CWS, Kim on 06/05/2021 15:01:23 ?[place], MIDA CORY. ([id number]) ?-------------------------------------------------------------------------------- ?Lower Extremity Assessment Details ?Patient Name: [name] ?Date of Service: 06/05/2021 2:45 PM ?Medical Record Number: [id number] ?Patient Account Number: [id number] ?Date of Birth/Sex: 1970-07-26 (51 y.o. F) ?Treating RN: Cornell Barman ?Primary Care Teige Rountree: Margurite Auerbach Other Clinician: ?Referring Swan Zayed: Margurite Auerbach ?Treating Breionna Punt/Extender: Jeri Cos ?Weeks in Treatment: 2 ?Edema Assessment ?Assessed: [Left: No] [Right: No] ?[Left: Edema] [Right: :] ?Calf ?Left: Right: ?Point of Measurement: 32 cm From Medial Instep 39 cm ?Ankle ?Left: Right: ?Point of Measurement: 10 cm From Medial Instep 25 cm ?Vascular Assessment ?Pulses: ?Dorsalis Pedis ?Palpable: [Right:Yes] ?Electronic Signature(s) ?Signed: 06/05/2021 4:59:50 PM By: Gretta Cool, BSN, RN, CWS, Kim RN, BSN ?Entered By: Gretta Cool, BSN, RN,  CWS, Kim on 06/05/2021 15:13:05 ?[place], ANALISA SLEDD. ([id number]) ?-------------------------------------------------------------------------------- ?Multi Wound Chart Details ?Patient Name: [name] ?Date of Service: 06/05/2021 2:45 PM ?Medical Record Number: [id number] ?Patient Account Number: [id number] ?Date of Birth/Sex: 03-29-1970 (51 y.o. F) ?Treating RN: Cornell Barman ?Primary Care Lashay Osborne: Margurite Auerbach Other Clinician: ?Referring Tenesha Garza: Margurite Auerbach ?Treating Gelene Recktenwald/Extender: Jeri Cos ?Weeks in Treatment: 2 ?Vital Signs ?Height(in): 64 ?Pulse(bpm): 101 ?Weight(lbs): 259 ?Blood Pressure(mmHg): 152/64 ?Body Mass Index(BMI): 44.5 ?Temperature(??F): 98.5 ?Respiratory Rate(breaths/min): 18 ?Photos: [2:No Photos] [N/A:N/A] ?Wound Location: [2:Right, Circumferential Foot] [N/A:N/A] ?Wounding Event: [2:Gradually Appeared] [N/A:N/A] ?Primary Etiology: [2:Diabetic Wound/Ulcer of the Lower N/A Extremity] ?Comorbid History: [2:Cataracts, Anemia, Asthma, Chronic N/A Obstructive Pulmonary Disease (COPD), Congestive Heart Failure, Hypertension, Type II Diabetes, End Stage Renal Disease, Osteoarthritis, Neuropathy] ?Date Acquired: [2:10/15/2020] [N/A:N/A] ?Weeks of Treatment: [2:2] [N/A:N/A] ?Wound Status: [2:Open] [N/A:N/A] ?Wound Recurrence: [2:No] [N/A:N/A] ?Pending Amputation on [2:Yes] [N/A:N/A] ?Presentation: ?Measurements L x W x D (cm) [2:13x16x1.3] [N/A:N/A] ?Area (cm?) : [2:163.363] [N/A:N/A] ?Volume (cm?) : [2:212.372] [N/A:N/A] ?% Reduction in Area: [2:-54.10%] [N/A:N/A] ?% Reduction in Volume: [2:-0.10%] [N/A:N/A] ?Position 1 (o'clock): [2:6] ?Maximum Distance 1 (cm): [2:2.2] ?Tunneling: [2:Yes] [N/A:N/A] ?Classification: [2:Grade 4] [N/A:N/A] ?Exudate Amount: [2:Medium] [N/A:N/A] ?Exudate Type: [2:Serosanguineous] [N/A:N/A] ?Exudate Color: [2:red, brown] [N/A:N/A] ?Granulation Amount: [2:Small (1-33%)] [N/A:N/A] ?Granulation Quality: [2:Red] [N/A:N/A] ?Necrotic Amount: [2:Large (67-100%)]  [N/A:N/A] ?Necrotic Tissue: [2:Eschar] [N/A:N/A] ?Exposed Structures: ?[2:Fat Layer (Subcutaneous Tissue): Yes Tendon: Yes None] [N/A:N/A N/A] ?Treatment Notes ?Electronic Signature(s) ?Signed: 06/05/2021 4:59:50 PM By: Gretta Cool, BSN, RN, CWS, Kim RN, BSN ?Entered By: Gretta Cool, BSN, RN, CWS, Kim on 06/05/2021 15:16:20 ?[name], [name]. ([id number]) ?-------------------------------------------------------------------------------- ?Multi-Disciplinary Care Plan Details ?Patient Name: [name] ?Date of Service: 06/05/2021 2:45 PM ?Medical Record Number: [id number] ?Patient Account Number: [id number] ?Date of Birth/Sex: 15-Aug-1970 (51 y.o. F) ?Treating RN: Cornell Barman ?Primary Care Mirely Pangle: Margurite Auerbach Other Clinician: ?Referring Chrisotpher Rivero: Margurite Auerbach ?Treating Glyn Gerads/Extender: Jeri Cos ?Weeks in Treatment: 2 ?Active Inactive ?HBO ?Nursing Diagnoses: ?Anxiety related to feelings  of confinement associated with the hyperbaric oxygen chamber ?Anxiety related to knowledge deficit of hyperbaric oxygen therapy and treatment procedures ?Discomfort related to temperature and humidity changes inside hyperbaric chamber ?Potential for barotraumas to ears, sinuses, teeth, and lungs or cerebral gas embolism related to changes in atmospheric pressure inside hyperbaric ?oxygen chamber ?Potential for oxygen toxicity seizures related to delivery of 100% oxygen at an increased atmospheric pressure ?Potential for pulmonary oxygen toxicity related to delivery of 100% oxygen at an increased atmospheric pressure ?Goals: ?Barotrauma will be prevented during HBO2 ?Date Initiated: 06/05/2021 ?Target Resolution Date: 06/05/2021 ?Goal Status: Active ?Patient and/or family will be able to state/discuss factors appropriate to the management of their disease process during treatment ?Date Initiated: 06/05/2021 ?Target Resolution Date: 06/05/2021 ?Goal Status: Active ?Patient will tolerate the hyperbaric oxygen therapy treatment ?Date  Initiated: 06/05/2021 ?Target Resolution Date: 06/05/2021 ?Goal Status: Active ?Patient will tolerate the internal climate of the chamber ?Date Initiated: 06/05/2021 ?Target Resolution Date: 06/05/2021 ?Goal Status: Active ?Patient/caregiver will verbalize understanding of HBO goals, rationale, procedures and potential hazards ?Date Initiated: 06/05/2021 ?Target Resolution Date: 06/05/2021 ?Goal Status: Active ?Signs and symptoms of pulmonary oxygen toxicity will be recognized and promptly addressed ?Date Initiated: 06/05/2021 ?Target Resolution Date: 06/05/2021 ?Goal Status: Active ?Signs and symptoms of seizure will be recognized and promptly addressed ; seizing patients will suffer no harm ?Date Initiated: 06/05/2021 ?Target Resolution Date: 06/05/2021 ?Goal Status: Active ?Interventions: ?Administer a five (5) minute air break for patient if signs and symptoms of seizure appear and notify the hyperbaric physician ?Administer decongestants, per physician orders, prior to HBO2 ?Administer the correct therapeutic gas delivery based on the patients needs and limitations, per physician order ?Assess and provide for patientos comfort related to the hyperbaric environment and equalization of middle ear ?Assess for signs and symptoms related to adverse events, including but not limited to confinement anxiety, pneumothorax, oxygen toxicity and ?baurotrauma ?Assess patient for any history of confinement anxiety ?Assess patient's knowledge and expectations regarding hyperbaric medicine and provide education related to the hyperbaric environment, goals of ?treatment and prevention of adverse events ?Implement protocols to decrease risk of pneumothorax in high risk patients ?Notes: ?Necrotic Tissue ?Nursing Diagnoses: ?Impaired tissue integrity related to necrotic/devitalized tissue ?Shams, BRINKLEE CISSE. ([id number]) ?Knowledge deficit related to management of necrotic/devitalized tissue ?Goals: ?Necrotic/devitalized tissue will be  minimized in the wound bed ?Date Initiated: 06/05/2021 ?Target Resolution Date: 06/05/2021 ?Goal Status: Active ?Patient/caregiver will verbalize understanding of reason and process for debridement of necrotic tissue ?Date I

## 2021-06-06 ENCOUNTER — Ambulatory Visit: Payer: Medicare Other | Admitting: Pulmonary Disease

## 2021-06-06 ENCOUNTER — Encounter: Payer: Self-pay | Admitting: Pulmonary Disease

## 2021-06-06 VITALS — BP 150/90 | HR 80 | Temp 99.2°F | Ht 64.5 in | Wt 268.0 lb

## 2021-06-06 DIAGNOSIS — G4733 Obstructive sleep apnea (adult) (pediatric): Secondary | ICD-10-CM

## 2021-06-06 DIAGNOSIS — J9611 Chronic respiratory failure with hypoxia: Secondary | ICD-10-CM | POA: Diagnosis not present

## 2021-06-06 DIAGNOSIS — J4550 Severe persistent asthma, uncomplicated: Secondary | ICD-10-CM | POA: Diagnosis not present

## 2021-06-06 DIAGNOSIS — E662 Morbid (severe) obesity with alveolar hypoventilation: Secondary | ICD-10-CM

## 2021-06-06 MED ORDER — ALBUTEROL SULFATE HFA 108 (90 BASE) MCG/ACT IN AERS: INHALATION_SPRAY | RESPIRATORY_TRACT | 3 refills | Status: DC

## 2021-06-06 NOTE — Progress Notes (Signed)
Subjective:    Patient ID: [name], female    DOB: [birth year], 51 y.o.   MRN: [id number] Patient Care Team: Fayrene Helper, NP as PCP - General (Nurse Practitioner) Antonieta Iba, MD as PCP - Cardiology (Cardiology) Antonieta Iba, MD as Consulting Physician (Cardiology) Delma Freeze, FNP as Nurse Practitioner (Family Medicine) Shane Crutch, MD as Consulting Physician (Pulmonary Disease)  Chief Complaint  Patient presents with   Follow-up    More sob with allergies.  Problems with bipap, wants to change masks to a nasal mask.  3 months ago had fluid removed from right side of chest.   HPI Patient is a 51 year old lifelong never smoker, with end-stage renal disease, obesity with obesity with alveolar hypoventilation syndrome and severe persistent asthma with asthmatic bronchitis who follows here after recent admission to [facility] on 25 February due to pancreatitis.  I saw the patient in consultation during her hospital stay on 27 April 2021.  For the details of the consult please refer to that note.  The patient had a sleep study in the earlier parts of February and it is well-documented in the chart that we discussed with the patient that she has SEVERE sleep apnea and would need an in lab titration study.  The patient however, has declined the in lab titration study and we have been trying to obtain auto BiPAP for her with other parameters.  She however has not had these outpatient studies done.  We were able to obtain BiPAP for the patient during her hospitalization she has been set with auto BiPAP.  She however cannot tolerate the machine and has been noncompliant.  She requests a change of masks.  The patient has not had any recent change in her baseline shortness of breath.  She has been coughing up yellowish sputum.  She underwent bone biopsy of her gangrenous foot to determine antibiotic coverage.  She has not had any chest pain, no hemoptysis.  She has been  compliant with dialysis.   She has chronic hypercarbic and hypoxic respiratory failure due to extreme obesity with alveolar hypoventilation.  She says at baseline 3 L/min oxygen supplementation and 4 L/min during dialysis.   She has been having some generalized itching and watery eyes.  She states it is "allergies".  No wheezing.  She does not endorse any other symptomatology today.  She is on triple nebulizer therapy with Perforomist/Pulmicort/Yupelri she is however not using the medications correctly and these were discussed with her and her house who is present with her today.  Review of Systems A 10 point review of systems was performed and it is as noted above otherwise negative.  Patient Active Problem List   Diagnosis Date Noted   SOB (shortness of breath)    History of osteomyelitis    RUQ pain    Status post thoracentesis    Acute hematogenous osteomyelitis of right foot (HCC)    Pancreatitis 04/21/2021   Metabolic acidosis, increased anion gap 04/21/2021   GERD (gastroesophageal reflux disease) 04/21/2021   Acute on chronic diastolic CHF (congestive heart failure) (HCC) 11/14/2020   Anemia of chronic disease    Hyponatremia    Gangrene (HCC)    PVD (peripheral vascular disease) (HCC)    Aspiration into airway    Hearing loss of right ear    Diabetic foot ulcer (HCC) 10/15/2020   Decreased pedal pulses 10/15/2020   Chronic respiratory failure with hypoxia (HCC)    Extreme obesity with alveolar  hypoventilation (HCC)    Palliative care by specialist    Goals of care, counseling/discussion    ESRD (end stage renal disease) (HCC)    Adjustment disorder with mixed anxiety and depressed mood 03/31/2017   Dysthymia 03/31/2017   Chronic pain syndrome 03/31/2017   Palliative care encounter    Obesity hypoventilation syndrome (HCC) 03/09/2017   Chronic diastolic heart failure (HCC) 01/07/2017   Depression 09/24/2016   Acute on chronic diastolic heart failure (HCC) 07/04/2016    COPD with chronic bronchitis (HCC) 07/04/2016   Acute on chronic respiratory failure (HCC) 06/26/2016   CKD (chronic kidney disease), stage III (HCC) 06/26/2016   COPD exacerbation (HCC) 03/25/2016   Anxiety 10/02/2015   Asthma 08/30/2015   Type 2 diabetes mellitus with ESRD (end-stage renal disease) (HCC) 03/13/2015   Iron deficiency anemia 02/17/2015   Obesity, Class III, BMI 40-49.9 (morbid obesity) (HCC)    Shortness of breath    Essential hypertension    Hypertensive heart disease with congestive heart failure (HCC) 12/18/2014   Diabetes mellitus with ESRD (end-stage renal disease) (HCC) 12/15/2014   Social History   Tobacco Use   Smoking status: Never    Passive exposure: Past   Smokeless tobacco: Never  Substance Use Topics   Alcohol use: Not Currently    Comment: occasional   Allergies  Allergen Reactions   Dust Mite Extract Shortness Of Breath and Itching   Mold Extract [Trichophyton] Shortness Of Breath and Itching   Pollen Extract Shortness Of Breath    Itching, shortness of breath, asthma like symptoms   Sulfa Antibiotics Itching   Feraheme [Ferumoxytol] Itching    Uses benadryl so she can get the medicine   Current Meds  Medication Sig   acetaminophen (TYLENOL) 500 MG tablet Take 1,000 mg by mouth every 6 (six) hours as needed for mild pain, fever or headache.   albuterol (PROVENTIL) (2.5 MG/3ML) 0.083% nebulizer solution Take 3 mLs (2.5 mg total) by nebulization every 6 (six) hours as needed for wheezing or shortness of breath.   albuterol (VENTOLIN HFA) 108 (90 Base) MCG/ACT inhaler INHALE 2 PUFFS BY MOUTH EVERY 4 HOURS AS NEEDED FOR WHEEZING FOR SHORTNESS OF BREATH   aspirin EC 81 MG tablet Take 81 mg by mouth daily.    b complex-vitamin c-folic acid (NEPHRO-VITE) 0.8 MG TABS tablet Take 1 tablet by mouth at bedtime.   budesonide (PULMICORT) 0.25 MG/2ML nebulizer solution Take 2 mLs (0.25 mg total) by nebulization 2 (two) times daily.   budesonide  (PULMICORT) 0.5 MG/2ML nebulizer solution Take by nebulization.   ceFEPime (MAXIPIME) IVPB Inject 2 g into the vein Every Tuesday,Thursday,and Saturday with dialysis. Cefepime 2gm with hemodialysis on Tue, Thur, Sat Indication: diabetic foot infection Last Day of Therapy:  06/11/2021 Labs - Once weekly:  CBC/D, CMP, and vancomycin trough   ceFEPIme 2 g in sodium chloride 0.9 % 100 mL Inject 2 g into the vein Every Tuesday,Thursday,and Saturday with dialysis.   Cholecalciferol 100 MCG (4000 UT) CAPS Take 1 capsule by mouth once a week.   citalopram (CELEXA) 20 MG tablet Take 20 mg by mouth at bedtime.   clindamycin (CLEOCIN T) 1 % lotion Apply 1 application topically daily.   ferric citrate (AURYXIA) 1 GM 210 MG(Fe) tablet Take 210 mg by mouth 3 (three) times daily with meals.   fluconazole (DIFLUCAN) 200 MG tablet Take 1 tablet (200 mg total) by mouth at bedtime.   formoterol (PERFOROMIST) 20 MCG/2ML nebulizer solution    insulin  aspart (NOVOLOG) 100 UNIT/ML injection Inject 0-15 Units into the skin 3 (three) times daily with meals.   insulin degludec (TRESIBA) 100 UNIT/ML FlexTouch Pen Inject 30 Units into the skin daily.   lactulose (CHRONULAC) 10 GM/15ML solution Take 30 mLs (20 g total) by mouth daily as needed for severe constipation.   levocetirizine (XYZAL) 5 MG tablet Take 5 mg by mouth at bedtime.   lidocaine-prilocaine (EMLA) cream Apply topically as needed (For access for dialysis).   LINZESS 290 MCG CAPS capsule Take 290 mcg by mouth every morning.   Omega-3 Fatty Acids ([employer] NATURAL FISH OIL) 1000 MG CAPS 2 mL 2 TIMES DAILY (route: inhalation)   omeprazole (PRILOSEC) 20 MG capsule Take 20 mg by mouth daily.   oxyCODONE-acetaminophen (PERCOCET) 5-325 MG tablet Take 1 tablet by mouth every 4 (four) hours as needed for severe pain.   OXYGEN Inhale 2-4 L into the lungs.   revefenacin (YUPELRI) 175 MCG/3ML nebulizer solution    vancomycin (VANCOCIN) 1-5 GM/200ML-% SOLN Inject 200 mLs  (1,000 mg total) into the vein Every Tuesday,Thursday,and Saturday with dialysis.   vancomycin IVPB Inject 1,000 mg into the vein Every Tuesday,Thursday,and Saturday with dialysis. Vancomycin 1gm with hemodialysis on Tue, Thur, Sat Indication: diabetic foot infection Last Day of Therapy:  06/11/2021 Labs - Once weekly:  CBC/D, CMP, and vancomycin trough   Immunization History  Administered Date(s) Administered   Hepatitis B, adult 09/26/2018   Influenza,inj,Quad PF,6+ Mos 11/14/2016, 01/02/2021   Influenza-Unspecified 11/14/2016, 12/27/2017, 10/16/2018, 11/05/2018   PPD Test 04/15/2017, 05/06/2017, 05/15/2017, 04/28/2018, 04/13/2019, 06/01/2020   Pneumococcal Polysaccharide-23 04/29/2017       Objective:   Physical Exam BP (!) 150/90 (BP Location: Right Arm, Patient Position: Sitting, Cuff Size: Large)   Pulse 80   Temp 99.2 F (37.3 C) (Oral)   Ht 5' 4.5" (1.638 m)   Wt 268 lb (121.6 kg)   LMP 09/06/2016 (Approximate)   SpO2 100%   BMI 45.29 kg/m  GENERAL: Obese woman in no acute respiratory distress.  She is sitting in bed without distress.  She is wearing oxygen via nasal cannula at 3 L/min.  HEAD: Normocephalic, atraumatic.  EYES: Pupils equal, round, reactive to light.  No scleral icterus.  MOUTH: Oral mucosa moist no thrush. NECK: Thick, circumference 19 inches, supple. No thyromegaly. Trachea midline. No JVD.  No adenopathy. PULMONARY: Symmetrical air entry.  Coarse breath sounds but no wheezes noted.  Moving air fairly well. CARDIOVASCULAR: S1 and S2. Regular rate and rhythm.  No rubs or gallops heard.  Grade 2/6 systolic ejection murmur radiating to axilla. GASTROINTESTINAL: Obese abdomen, nondistended. MUSCULOSKELETAL: AV fistula left upper extremity with good thrill.  She has a gangrenous right foot,it is wrapped. This was not exposed for examination. NEUROLOGIC: Awake, alert, no overt focal deficits.  Speech is fluent.  Gait not assessed. SKIN: Intact,warm,dry.  No  overt rashes noted. PSYCH: Mood and behavior appropriate.  Insight: Poor      Assessment & Plan:     ICD-10-CM   1. Severe persistent asthmatic bronchitis without complication  J45.50    Continue Perforomist/Pulmicort/Yupelri I hand wrote the correct way of using the nebulizer solutions Continue as needed albuterol    2. Extreme obesity with alveolar hypoventilation (HCC)  E66.2    This issue adds complexity to her management Pain culprit in her respiratory failure issues She would benefit from weight loss    3. Chronic respiratory failure with hypoxia (HCC)  J96.11    Continue oxygen supplementation  as this Continue dialysis for fluid management    4. OSA (obstructive sleep apnea) - Severe, on BiPAP  G47.33 CANCELED: Ambulatory Referral for DME   Will try different mask for her BiPAP Advised needs to be compliant with BiPAP     Meds ordered this encounter  Medications   albuterol (VENTOLIN HFA) 108 (90 Base) MCG/ACT inhaler    Sig: INHALE 2 PUFFS BY MOUTH EVERY 4 HOURS AS NEEDED FOR WHEEZING FOR SHORTNESS OF BREATH    Dispense:  18 g    Refill:  3   Will see the patient in follow-up in 3 months time she is to contact us prior to that time should any new difficulties arise.  Gailen Shelter, MD Advanced Bronchoscopy PCCM [facility]    *This note was dictated using voice recognition software/Dragon.  Despite best efforts to proofread, errors can occur which can change the meaning. Any transcriptional errors that result from this process are unintentional and may not be fully corrected at the time of dictation.

## 2021-06-06 NOTE — Patient Instructions (Signed)
For your allergies, instead of Xyzal try switching to Zyrtec 1 tablet at bedtime. ? ?I am writing the instructions for your nebulizer as follows: ? ? ? ? ? ? ? ? ? ? ? ?We have sent a referral to [facility] to see if they can get you nasal pillows for your BiPAP. ? ? ? ?We will see you in follow-up in 3 months time call sooner should any new problems arise. ?

## 2021-06-07 DIAGNOSIS — M86171 Other acute osteomyelitis, right ankle and foot: Secondary | ICD-10-CM | POA: Diagnosis not present

## 2021-06-07 DIAGNOSIS — I5030 Unspecified diastolic (congestive) heart failure: Secondary | ICD-10-CM | POA: Diagnosis not present

## 2021-06-07 DIAGNOSIS — J449 Chronic obstructive pulmonary disease, unspecified: Secondary | ICD-10-CM | POA: Diagnosis not present

## 2021-06-07 DIAGNOSIS — L989 Disorder of the skin and subcutaneous tissue, unspecified: Secondary | ICD-10-CM | POA: Diagnosis not present

## 2021-06-07 DIAGNOSIS — B9689 Other specified bacterial agents as the cause of diseases classified elsewhere: Secondary | ICD-10-CM | POA: Diagnosis not present

## 2021-06-07 DIAGNOSIS — M869 Osteomyelitis, unspecified: Secondary | ICD-10-CM | POA: Diagnosis not present

## 2021-06-07 DIAGNOSIS — J9610 Chronic respiratory failure, unspecified whether with hypoxia or hypercapnia: Secondary | ICD-10-CM | POA: Diagnosis not present

## 2021-06-07 DIAGNOSIS — L97509 Non-pressure chronic ulcer of other part of unspecified foot with unspecified severity: Secondary | ICD-10-CM | POA: Diagnosis not present

## 2021-06-07 DIAGNOSIS — I96 Gangrene, not elsewhere classified: Secondary | ICD-10-CM | POA: Diagnosis not present

## 2021-06-07 DIAGNOSIS — B952 Enterococcus as the cause of diseases classified elsewhere: Secondary | ICD-10-CM | POA: Diagnosis not present

## 2021-06-07 DIAGNOSIS — B951 Streptococcus, group B, as the cause of diseases classified elsewhere: Secondary | ICD-10-CM | POA: Diagnosis not present

## 2021-06-07 DIAGNOSIS — N186 End stage renal disease: Secondary | ICD-10-CM | POA: Diagnosis not present

## 2021-06-07 DIAGNOSIS — Z992 Dependence on renal dialysis: Secondary | ICD-10-CM | POA: Diagnosis not present

## 2021-06-08 DIAGNOSIS — I132 Hypertensive heart and chronic kidney disease with heart failure and with stage 5 chronic kidney disease, or end stage renal disease: Secondary | ICD-10-CM | POA: Diagnosis not present

## 2021-06-08 DIAGNOSIS — I5032 Chronic diastolic (congestive) heart failure: Secondary | ICD-10-CM | POA: Diagnosis not present

## 2021-06-08 DIAGNOSIS — Z794 Long term (current) use of insulin: Secondary | ICD-10-CM | POA: Diagnosis not present

## 2021-06-08 DIAGNOSIS — E1151 Type 2 diabetes mellitus with diabetic peripheral angiopathy without gangrene: Secondary | ICD-10-CM | POA: Diagnosis not present

## 2021-06-08 DIAGNOSIS — M869 Osteomyelitis, unspecified: Secondary | ICD-10-CM | POA: Diagnosis not present

## 2021-06-08 DIAGNOSIS — Z992 Dependence on renal dialysis: Secondary | ICD-10-CM | POA: Diagnosis not present

## 2021-06-08 DIAGNOSIS — N186 End stage renal disease: Secondary | ICD-10-CM | POA: Diagnosis not present

## 2021-06-08 DIAGNOSIS — D631 Anemia in chronic kidney disease: Secondary | ICD-10-CM | POA: Diagnosis not present

## 2021-06-08 DIAGNOSIS — E1122 Type 2 diabetes mellitus with diabetic chronic kidney disease: Secondary | ICD-10-CM | POA: Diagnosis not present

## 2021-06-08 DIAGNOSIS — E11621 Type 2 diabetes mellitus with foot ulcer: Secondary | ICD-10-CM | POA: Diagnosis not present

## 2021-06-08 DIAGNOSIS — M86171 Other acute osteomyelitis, right ankle and foot: Secondary | ICD-10-CM | POA: Diagnosis not present

## 2021-06-08 DIAGNOSIS — G4733 Obstructive sleep apnea (adult) (pediatric): Secondary | ICD-10-CM | POA: Diagnosis not present

## 2021-06-08 DIAGNOSIS — B951 Streptococcus, group B, as the cause of diseases classified elsewhere: Secondary | ICD-10-CM | POA: Diagnosis not present

## 2021-06-08 DIAGNOSIS — B9689 Other specified bacterial agents as the cause of diseases classified elsewhere: Secondary | ICD-10-CM | POA: Diagnosis not present

## 2021-06-08 DIAGNOSIS — Z9981 Dependence on supplemental oxygen: Secondary | ICD-10-CM | POA: Diagnosis not present

## 2021-06-08 DIAGNOSIS — L97512 Non-pressure chronic ulcer of other part of right foot with fat layer exposed: Secondary | ICD-10-CM | POA: Diagnosis not present

## 2021-06-08 DIAGNOSIS — L989 Disorder of the skin and subcutaneous tissue, unspecified: Secondary | ICD-10-CM | POA: Diagnosis not present

## 2021-06-08 DIAGNOSIS — B952 Enterococcus as the cause of diseases classified elsewhere: Secondary | ICD-10-CM | POA: Diagnosis not present

## 2021-06-09 DIAGNOSIS — Z992 Dependence on renal dialysis: Secondary | ICD-10-CM | POA: Diagnosis not present

## 2021-06-09 DIAGNOSIS — B952 Enterococcus as the cause of diseases classified elsewhere: Secondary | ICD-10-CM | POA: Diagnosis not present

## 2021-06-09 DIAGNOSIS — B951 Streptococcus, group B, as the cause of diseases classified elsewhere: Secondary | ICD-10-CM | POA: Diagnosis not present

## 2021-06-09 DIAGNOSIS — B9689 Other specified bacterial agents as the cause of diseases classified elsewhere: Secondary | ICD-10-CM | POA: Diagnosis not present

## 2021-06-09 DIAGNOSIS — N186 End stage renal disease: Secondary | ICD-10-CM | POA: Diagnosis not present

## 2021-06-09 DIAGNOSIS — M86171 Other acute osteomyelitis, right ankle and foot: Secondary | ICD-10-CM | POA: Diagnosis not present

## 2021-06-09 DIAGNOSIS — L989 Disorder of the skin and subcutaneous tissue, unspecified: Secondary | ICD-10-CM | POA: Diagnosis not present

## 2021-06-09 DIAGNOSIS — M869 Osteomyelitis, unspecified: Secondary | ICD-10-CM | POA: Diagnosis not present

## 2021-06-11 DIAGNOSIS — E11621 Type 2 diabetes mellitus with foot ulcer: Secondary | ICD-10-CM | POA: Diagnosis not present

## 2021-06-11 DIAGNOSIS — E1122 Type 2 diabetes mellitus with diabetic chronic kidney disease: Secondary | ICD-10-CM | POA: Diagnosis not present

## 2021-06-11 DIAGNOSIS — I132 Hypertensive heart and chronic kidney disease with heart failure and with stage 5 chronic kidney disease, or end stage renal disease: Secondary | ICD-10-CM | POA: Diagnosis not present

## 2021-06-11 DIAGNOSIS — N186 End stage renal disease: Secondary | ICD-10-CM | POA: Diagnosis not present

## 2021-06-11 DIAGNOSIS — Z9981 Dependence on supplemental oxygen: Secondary | ICD-10-CM | POA: Diagnosis not present

## 2021-06-11 DIAGNOSIS — I5032 Chronic diastolic (congestive) heart failure: Secondary | ICD-10-CM | POA: Diagnosis not present

## 2021-06-11 DIAGNOSIS — D631 Anemia in chronic kidney disease: Secondary | ICD-10-CM | POA: Diagnosis not present

## 2021-06-11 DIAGNOSIS — Z992 Dependence on renal dialysis: Secondary | ICD-10-CM | POA: Diagnosis not present

## 2021-06-11 DIAGNOSIS — L97512 Non-pressure chronic ulcer of other part of right foot with fat layer exposed: Secondary | ICD-10-CM | POA: Diagnosis not present

## 2021-06-11 DIAGNOSIS — E1151 Type 2 diabetes mellitus with diabetic peripheral angiopathy without gangrene: Secondary | ICD-10-CM | POA: Diagnosis not present

## 2021-06-11 DIAGNOSIS — Z794 Long term (current) use of insulin: Secondary | ICD-10-CM | POA: Diagnosis not present

## 2021-06-12 ENCOUNTER — Encounter: Payer: Medicare Other | Admitting: Physician Assistant

## 2021-06-12 ENCOUNTER — Ambulatory Visit: Payer: Medicare Other | Attending: Infectious Diseases | Admitting: Infectious Diseases

## 2021-06-12 ENCOUNTER — Other Ambulatory Visit
Admission: RE | Admit: 2021-06-12 | Discharge: 2021-06-12 | Disposition: A | Discharge status: Home / Self Care | Payer: Medicare Other | Attending: Infectious Diseases | Admitting: Infectious Diseases

## 2021-06-12 ENCOUNTER — Encounter: Payer: Self-pay | Admitting: Infectious Diseases

## 2021-06-12 VITALS — BP 205/102 | HR 106 | Temp 97.5°F

## 2021-06-12 DIAGNOSIS — M86671 Other chronic osteomyelitis, right ankle and foot: Secondary | ICD-10-CM | POA: Diagnosis not present

## 2021-06-12 DIAGNOSIS — E1122 Type 2 diabetes mellitus with diabetic chronic kidney disease: Secondary | ICD-10-CM | POA: Insufficient documentation

## 2021-06-12 DIAGNOSIS — I5042 Chronic combined systolic (congestive) and diastolic (congestive) heart failure: Secondary | ICD-10-CM | POA: Diagnosis not present

## 2021-06-12 DIAGNOSIS — J449 Chronic obstructive pulmonary disease, unspecified: Secondary | ICD-10-CM | POA: Diagnosis not present

## 2021-06-12 DIAGNOSIS — I96 Gangrene, not elsewhere classified: Secondary | ICD-10-CM | POA: Diagnosis not present

## 2021-06-12 DIAGNOSIS — Z992 Dependence on renal dialysis: Secondary | ICD-10-CM | POA: Insufficient documentation

## 2021-06-12 DIAGNOSIS — L97514 Non-pressure chronic ulcer of other part of right foot with necrosis of bone: Secondary | ICD-10-CM | POA: Diagnosis not present

## 2021-06-12 DIAGNOSIS — E1152 Type 2 diabetes mellitus with diabetic peripheral angiopathy with gangrene: Secondary | ICD-10-CM | POA: Insufficient documentation

## 2021-06-12 DIAGNOSIS — N186 End stage renal disease: Secondary | ICD-10-CM | POA: Insufficient documentation

## 2021-06-12 DIAGNOSIS — E1169 Type 2 diabetes mellitus with other specified complication: Secondary | ICD-10-CM | POA: Diagnosis not present

## 2021-06-12 DIAGNOSIS — Z9981 Dependence on supplemental oxygen: Secondary | ICD-10-CM | POA: Insufficient documentation

## 2021-06-12 DIAGNOSIS — I132 Hypertensive heart and chronic kidney disease with heart failure and with stage 5 chronic kidney disease, or end stage renal disease: Secondary | ICD-10-CM | POA: Insufficient documentation

## 2021-06-12 DIAGNOSIS — M86371 Chronic multifocal osteomyelitis, right ankle and foot: Secondary | ICD-10-CM | POA: Diagnosis not present

## 2021-06-12 DIAGNOSIS — L989 Disorder of the skin and subcutaneous tissue, unspecified: Secondary | ICD-10-CM | POA: Diagnosis not present

## 2021-06-12 DIAGNOSIS — E662 Morbid (severe) obesity with alveolar hypoventilation: Secondary | ICD-10-CM | POA: Diagnosis not present

## 2021-06-12 DIAGNOSIS — L089 Local infection of the skin and subcutaneous tissue, unspecified: Secondary | ICD-10-CM | POA: Diagnosis not present

## 2021-06-12 DIAGNOSIS — M86171 Other acute osteomyelitis, right ankle and foot: Secondary | ICD-10-CM | POA: Diagnosis not present

## 2021-06-12 DIAGNOSIS — Z792 Long term (current) use of antibiotics: Secondary | ICD-10-CM | POA: Diagnosis not present

## 2021-06-12 DIAGNOSIS — E11621 Type 2 diabetes mellitus with foot ulcer: Secondary | ICD-10-CM | POA: Diagnosis not present

## 2021-06-12 DIAGNOSIS — Z5181 Encounter for therapeutic drug level monitoring: Secondary | ICD-10-CM | POA: Diagnosis not present

## 2021-06-12 DIAGNOSIS — B952 Enterococcus as the cause of diseases classified elsewhere: Secondary | ICD-10-CM | POA: Diagnosis not present

## 2021-06-12 DIAGNOSIS — I509 Heart failure, unspecified: Secondary | ICD-10-CM | POA: Insufficient documentation

## 2021-06-12 DIAGNOSIS — Z89421 Acquired absence of other right toe(s): Secondary | ICD-10-CM | POA: Diagnosis not present

## 2021-06-12 DIAGNOSIS — M869 Osteomyelitis, unspecified: Secondary | ICD-10-CM | POA: Diagnosis not present

## 2021-06-12 DIAGNOSIS — B951 Streptococcus, group B, as the cause of diseases classified elsewhere: Secondary | ICD-10-CM | POA: Diagnosis not present

## 2021-06-12 DIAGNOSIS — B9689 Other specified bacterial agents as the cause of diseases classified elsewhere: Secondary | ICD-10-CM | POA: Diagnosis not present

## 2021-06-12 DIAGNOSIS — L0390 Cellulitis, unspecified: Secondary | ICD-10-CM | POA: Diagnosis not present

## 2021-06-12 DIAGNOSIS — J9610 Chronic respiratory failure, unspecified whether with hypoxia or hypercapnia: Secondary | ICD-10-CM | POA: Diagnosis not present

## 2021-06-12 LAB — CBC WITH DIFFERENTIAL/PLATELET
Abs Immature Granulocytes: 0.02 10*3/uL (ref 0.00–0.07)
Basophils Absolute: 0.1 10*3/uL (ref 0.0–0.1)
Basophils Relative: 1 %
Eosinophils Absolute: 0.3 10*3/uL (ref 0.0–0.5)
Eosinophils Relative: 6 %
HCT: 37.6 % (ref 36.0–46.0)
Hemoglobin: 11.8 g/dL — ABNORMAL LOW (ref 12.0–15.0)
Immature Granulocytes: 0 %
Lymphocytes Relative: 19 %
Lymphs Abs: 1.1 10*3/uL (ref 0.7–4.0)
MCH: 28.5 pg (ref 26.0–34.0)
MCHC: 31.4 g/dL (ref 30.0–36.0)
MCV: 90.8 fL (ref 80.0–100.0)
Monocytes Absolute: 0.4 10*3/uL (ref 0.1–1.0)
Monocytes Relative: 7 %
Neutro Abs: 3.7 10*3/uL (ref 1.7–7.7)
Neutrophils Relative %: 67 %
Platelets: 250 10*3/uL (ref 150–400)
RBC: 4.14 MIL/uL (ref 3.87–5.11)
RDW: 13.8 % (ref 11.5–15.5)
WBC: 5.6 10*3/uL (ref 4.0–10.5)
nRBC: 0 % (ref 0.0–0.2)

## 2021-06-12 LAB — C-REACTIVE PROTEIN: CRP: 1 mg/dL — ABNORMAL HIGH (ref <1.0)

## 2021-06-12 LAB — GLUCOSE, CAPILLARY
Glucose-Capillary: 115 mg/dL — ABNORMAL HIGH (ref 70–99)
Glucose-Capillary: 172 mg/dL — ABNORMAL HIGH (ref 70–99)

## 2021-06-12 LAB — SEDIMENTATION RATE: Sed Rate: 63 mm/hr — ABNORMAL HIGH (ref 0–20)

## 2021-06-12 NOTE — Patient Instructions (Signed)
You are here for chronic ischemia/ dry gangrene/chronic wound  of rt foot- you are completing 6 weeks of Ivvanco and cefepime and fluconazole- today will get esr/crp/cbc- you are staring hyperbaric oxygen- you may not need any more antibiotics ?

## 2021-06-12 NOTE — Progress Notes (Signed)
NAME: [name]  ?DOB: August 30, 1970  ?MRN: [id number]  ?Date/Time: 06/12/2021 11:24 AM ? ?Patient here for follow-up of the right foot infection ? ?[name] is a 51 y.o. with a history of DM, PAD right DFI, right foot dry gangrene ESRD on dialysis , CHF, HTN,  ? ?She was recently hospitalized in [facility] between for abdominal pain and was diagnosed with pancreatitis.  Incidentally they noted ? ?Patient in August  2022 was in the hospital with right diabetic foot infection with necrosis and osteomyelitis.  She underwent amputation of the fifth ray on 10/21/2020.  This was followed by a washout procedure on 10/26/2020.  As the foot did not appear salvageable amputation was recommended but podiatrist.  She declined.  She took IV vancomycin and cefepime for 8 weeks until 12/19/2020 during dialysis. ?Diabetic foot infection- necrotic with osteomyelitis- amputation of fifth ray and I&D on 8/27 ?-Washout procedure on 10/26/2020 ?-Recommended amputation which patient had declined  ?-Took  IV vancomycin and cefepime with dialysis ?-for 8 weeks until 12/19/20 ?I had seen her on 02/22/2021 and recommended no antibiotics as there was no signs of acute infection. ?She was at her baseline status and doing okay until 04/21/2021 when she presented to the ED with abdominal pain and nausea and vomiting.  She was diagnosed with pancreatitis.  They noticed the right foot chronic gangrene and got an MRI which showed osteomyelitis of the fourth metatarsal head and base of the fourth proximal phalanx.  Podiatrist was consulted and they again recommended amputation better if she refused. ?So they did a biopsy of the bone from the fourth metatarsal and the fourth toe and the pathology came back as viable bone without evidence of acute osteomyelitis in a the sample.  The culture had Candida parapsilosis she was seen by ID and was sent on p.o. fluconazole and IV vancomycin and cefepime during dialysis for total of 6 weeks. ?She is here for  follow-up ?She has no new issues to the leg.  The leg is pretty much the same like what I have seen in December.  She is going to undergo hyperbaric oxygen at the wound clinic. ?She does not have any systemic features like fever or shortness of breath or pain abdomen or cough. ? ?Past Medical History:  ?Diagnosis Date  ? (HFpEF) heart failure with preserved ejection fraction ([place])   ? a. 11/2014 Echo: EF 50-55%, Gr1 DD; b. 06/2016 Echo: EF 60-65%, no rwma, mod dil LA, nl RV, triv eff.  ? Anxiety   ? Asthma   ? Bronchitis   ? CHF (congestive heart failure) ([place])   ? Chronic cough   ? Chronic respiratory failure with hypoxia and hypercapnia (HCC)   ? a. Home O2.  (2-4L)  ? CKD (chronic kidney disease), stage III ([place])   ? Depression   ? Diabetes mellitus without complication ([place])   ? Diverticulosis   ? Dyspnea   ? Dysrhythmia   ? per pt, no arrythmias  ? Environmental and seasonal allergies   ? Extreme obesity with alveolar hypoventilation (HCC)   ? Hypertension   ? Iron deficiency anemia   ? a. chronic blood loss->heavy menses.  ? Kidney failure   ? dialysis tues/thur/sat   ... left arm shunt  ? Orthopnea   ? Oxygen dependent   ? 3l/min  ? Pericardial effusion   ? a. 11/2014 CT Chest: Mod effusion; b. 11/2014 Echo: small to mod circumferential effusion. No hemodynamic compromise.  ? Pneumonia   ?  history  ? Poorly controlled diabetes mellitus ([place])   ? a. 11/2014 A1c 13.2.  ? Swelling of both lower extremities   ?  ?Past Surgical History:  ?Procedure Laterality Date  ? A/V FISTULAGRAM Left 06/09/2017  ? Procedure: A/V FISTULAGRAM;  Surgeon: Algernon Huxley, MD;  Location: [place] [facility];  Service: Cardiovascular;  Laterality: Left;  ? A/V FISTULAGRAM Left 06/16/2017  ? Procedure: A/V FISTULAGRAM;  Surgeon: Algernon Huxley, MD;  Location: [place] [facility];  Service: Cardiovascular;  Laterality: Left;  ? A/V FISTULAGRAM Left 01/28/2018  ? Procedure: A/V FISTULAGRAM;  Surgeon: Algernon Huxley, MD;  Location: [place]  [facility];  Service: Cardiovascular;  Laterality: Left;  ? AMPUTATION TOE Right 10/21/2020  ? Procedure: AMPUTATION TOE-5th Digit Amputation Right Foot;  Surgeon: Edrick Kins, DPM;  Location: [facility];  Service: Podiatry;  Laterality: Right;  ? AV FISTULA PLACEMENT Left 04/25/2017  ? Procedure: ARTERIOVENOUS (AV) FISTULA CREATION;  Surgeon: Algernon Huxley, MD;  Location: [facility];  Service: Vascular;  Laterality: Left;  ? BONE BIOPSY Right 04/27/2021  ? Procedure: Right 4th metatarsal BONE BIOPSY;  Surgeon: [name], DPM;  Location: [facility];  Service: Podiatry;  Laterality: Right;  ? CATARACT EXTRACTION W/PHACO Left 01/21/2018  ? Procedure: CATARACT EXTRACTION PHACO AND INTRAOCULAR LENS PLACEMENT (IOC);  Surgeon: Leandrew Koyanagi, MD;  Location: [facility];  Service: Ophthalmology;  Laterality: Left;  [place] 01:55.5 ?CDE 9.05 ?EAUP 13.9 ?Fluid Pack Lot # [id number]  ? CESAREAN SECTION    ? times 3  ? DIALYSIS/PERMA CATHETER INSERTION Right 04/04/2017  ? Procedure: DIALYSIS/PERMA CATHETER INSERTION;  Surgeon: Katha Cabal, MD;  Location: [place] [facility];  Service: Cardiovascular;  Laterality: Right;  ? DIALYSIS/PERMA CATHETER REMOVAL N/A 09/17/2017  ? Procedure: DIALYSIS/PERMA CATHETER REMOVAL;  Surgeon: Algernon Huxley, MD;  Location: [place] [facility];  Service: Cardiovascular;  Laterality: N/A;  ? INCISION AND DRAINAGE Right 10/26/2020  ? Procedure: INCISION AND DRAINAGE;  Surgeon: [name], DPM;  Location: [facility];  Service: Podiatry;  Laterality: Right;  ? INSERTION OF DIALYSIS CATHETER N/A 04/16/2017  ? Procedure: INSERTION OF DIALYSIS PERM CATHETER;  Surgeon: Algernon Huxley, MD;  Location: [facility];  Service: Vascular;  Laterality: N/A;  ? LOWER EXTREMITY ANGIOGRAPHY Right 10/18/2020  ? Procedure: Lower Extremity Angiography;  Surgeon: Algernon Huxley, MD;  Location: [place] [facility];  Service: Cardiovascular;  Laterality: Right;  ?  ?Social History  ? ?Socioeconomic History  ? Marital status: Married  ?   Spouse name: abjul  ? Number of children: Not on file  ? Years of education: 32  ? Highest education level: High school graduate  ?Occupational History  ? Occupation: Disabled  ?Tobacco Use  ? Smoking status: Never  ?  Passive exposure: Past  ? Smokeless tobacco: Never  ?Vaping Use  ? Vaping Use: Never used  ?Substance and Sexual Activity  ? Alcohol use: Not Currently  ?  Comment: occasional  ? Drug use: Yes  ?  Types: Marijuana  ? Sexual activity: Not Currently  ?  Birth control/protection: None  ?Other Topics Concern  ? Not on file  ?Social History Narrative  ? Lives in [place] with her husband and 25 y/o child.  Three other children are grown and out of the house.  She is sedentary and does not routinely exercise.  On home O2.  ? ?Social Determinants of Health  ? ?Financial Resource Strain: Not on file  ?  Food Insecurity: Not on file  ?Transportation Needs: Not on file  ?Physical Activity: Not on file  ?Stress: Not on file  ?Social Connections: Not on file  ?Intimate Partner Violence: Not on file  ?  ?Family History  ?Problem Relation Age of Onset  ? Diabetes Mother   ? Hypertension Mother   ? Hypertension Father   ? Diabetes Father   ? Hypertension Other   ? Diabetes Other   ? Breast cancer Maternal Aunt 75  ? Breast cancer Maternal Aunt 11  ? ?Allergies  ?Allergen Reactions  ? Dust Mite Extract Shortness Of Breath and Itching  ? Mold Extract [Trichophyton] Shortness Of Breath and Itching  ? Pollen Extract Shortness Of Breath  ?  Itching, shortness of breath, asthma like symptoms  ? Sulfa Antibiotics Itching  ? Feraheme [Ferumoxytol] Itching  ?  Uses benadryl so she can get the medicine  ? ?I? ?Current Outpatient Medications  ?Medication Sig Dispense Refill  ? acetaminophen (TYLENOL) 500 MG tablet Take 1,000 mg by mouth every 6 (six) hours as needed for mild pain, fever or headache.    ? albuterol (PROVENTIL) (2.5 MG/3ML) 0.083% nebulizer solution Take 3 mLs (2.5 mg total) by nebulization every 6 (six) hours as  needed for wheezing or shortness of breath. 75 mL 12  ? albuterol (VENTOLIN HFA) 108 (90 Base) MCG/ACT inhaler INHALE 2 PUFFS BY MOUTH EVERY 4 HOURS AS NEEDED FOR WHEEZING FOR SHORTNESS OF BREATH 18 g 3

## 2021-06-12 NOTE — Progress Notes (Signed)
Saavedra, ADELL KOVAL. ([id number]) ?Visit Report for 06/12/2021 ?Arrival Information Details ?Patient Name: [name], [name] ?Date of Service: 06/12/2021 1:30 PM ?Medical Record Number: [id number] ?Patient Account Number: [id number] ?Date of Birth/Sex: March 10, 1970 (51 y.o. F) ?Treating RN: Cornell Barman ?Primary Care Courtlyn Aki: Margurite Auerbach ?Other Clinician: Jacqulyn Bath ?Referring Lexx Monte: Margurite Auerbach ?Treating Rhya Shan/Extender: Jeri Cos ?Weeks in Treatment: 3 ?Visit Information History Since Last Visit ?Added or deleted any medications: No ?Patient Arrived: Wheel Chair ?Any new allergies or adverse reactions: No ?Arrival Time: 13:55 ?Had a fall or experienced change in No ?Accompanied By: self ?activities of daily living that may affect ?Transfer Assistance: None ?risk of falls: ?Patient Identification Verified: Yes ?Signs or symptoms of abuse/neglect since last visito No ?Secondary Verification Process Completed: Yes ?Hospitalized since last visit: No ?Implantable device outside of the clinic excluding No ?cellular tissue based products placed in the center ?since last visit: ?Has Dressing in Place as Prescribed: Yes ?Pain Present Now: No ?Electronic Signature(s) ?Signed: 06/12/2021 4:30:32 PM By: Enedina Finner RCP, RRT, CHT ?Entered By: Enedina Finner on 06/12/2021 16:14:16 ?Carolan, KIP KAUTZMAN. ([id number]) ?-------------------------------------------------------------------------------- ?Encounter Discharge Information Details ?Patient Name: [name], [name] ?Date of Service: 06/12/2021 1:30 PM ?Medical Record Number: [id number] ?Patient Account Number: [id number] ?Date of Birth/Sex: 1970-11-11 (51 y.o. F) ?Treating RN: Cornell Barman ?Primary Care Isaura Schiller: Margurite Auerbach ?Other Clinician: Jacqulyn Bath ?Referring Chaquana Nichols: Margurite Auerbach ?Treating Mariajose Mow/Extender: Jeri Cos ?Weeks in Treatment: 3 ?Encounter Discharge Information Items ?Discharge Condition: Stable ?Ambulatory Status:  Ambulatory ?Discharge Destination: Home ?Transportation: Other ?Accompanied By: [facility] [employer] ?Schedule Follow-up Appointment: No ?Clinical Summary of Care: ?Notes ?Patient will decide if she feels she can try again with HBO. ?Electronic Signature(s) ?Signed: 06/12/2021 4:30:32 PM By: Enedina Finner RCP, RRT, CHT ?Entered By: Enedina Finner on 06/12/2021 16:30:02 ?Mcmichael, MINETTA KRISHER. ([id number]) ?-------------------------------------------------------------------------------- ?Vitals Details ?Patient Name: [name], [name] ?Date of Service: 06/12/2021 1:30 PM ?Medical Record Number: [id number] ?Patient Account Number: [id number] ?Date of Birth/Sex: 06-14-1970 (51 y.o. F) ?Treating RN: Cornell Barman ?Primary Care Abbigael Detlefsen: Margurite Auerbach ?Other Clinician: Jacqulyn Bath ?Referring Tata Timmins: Margurite Auerbach ?Treating Lavontae Cornia/Extender: Jeri Cos ?Weeks in Treatment: 3 ?Vital Signs ?Time Taken: 13:57 ?Temperature (??F): 97.9 ?Height (in): 64 ?Pulse (bpm): 60 ?Weight (lbs): 259 ?Respiratory Rate (breaths/min): 18 ?Body Mass Index (BMI): 44.5 ?Blood Pressure (mmHg): 160/84 ?Capillary Blood Glucose (mg/dl): 115 ?Reference Range: 80 - 120 mg / dl ?Electronic Signature(s) ?Signed: 06/12/2021 4:30:32 PM By: Enedina Finner RCP, RRT, CHT ?Entered By: Enedina Finner on 06/12/2021 16:14:49 ?

## 2021-06-13 ENCOUNTER — Telehealth: Payer: Self-pay

## 2021-06-13 ENCOUNTER — Encounter: Payer: Medicare Other | Admitting: Internal Medicine

## 2021-06-13 DIAGNOSIS — E1151 Type 2 diabetes mellitus with diabetic peripheral angiopathy without gangrene: Secondary | ICD-10-CM | POA: Diagnosis not present

## 2021-06-13 DIAGNOSIS — I132 Hypertensive heart and chronic kidney disease with heart failure and with stage 5 chronic kidney disease, or end stage renal disease: Secondary | ICD-10-CM | POA: Diagnosis not present

## 2021-06-13 DIAGNOSIS — Z794 Long term (current) use of insulin: Secondary | ICD-10-CM | POA: Diagnosis not present

## 2021-06-13 DIAGNOSIS — E11621 Type 2 diabetes mellitus with foot ulcer: Secondary | ICD-10-CM | POA: Diagnosis not present

## 2021-06-13 DIAGNOSIS — I5032 Chronic diastolic (congestive) heart failure: Secondary | ICD-10-CM | POA: Diagnosis not present

## 2021-06-13 DIAGNOSIS — N186 End stage renal disease: Secondary | ICD-10-CM | POA: Diagnosis not present

## 2021-06-13 DIAGNOSIS — L97512 Non-pressure chronic ulcer of other part of right foot with fat layer exposed: Secondary | ICD-10-CM | POA: Diagnosis not present

## 2021-06-13 DIAGNOSIS — E1122 Type 2 diabetes mellitus with diabetic chronic kidney disease: Secondary | ICD-10-CM | POA: Diagnosis not present

## 2021-06-13 DIAGNOSIS — Z992 Dependence on renal dialysis: Secondary | ICD-10-CM | POA: Diagnosis not present

## 2021-06-13 DIAGNOSIS — Z9981 Dependence on supplemental oxygen: Secondary | ICD-10-CM | POA: Diagnosis not present

## 2021-06-13 DIAGNOSIS — D631 Anemia in chronic kidney disease: Secondary | ICD-10-CM | POA: Diagnosis not present

## 2021-06-13 NOTE — Progress Notes (Addendum)
Blaschke, MILLEE DENISE. ([id number]) ?Visit Report for 06/12/2021 ?HBO Details ?Patient Name: [name], [name] ?Date of Service: 06/12/2021 1:30 PM ?Medical Record Number: [id number] ?Patient Account Number: [id number] ?Date of Birth/Sex: 02-06-1971 (51 y.o. F) ?Treating RN: Cornell Barman ?Primary Care Mariluz Crespo: Margurite Auerbach ?Other Clinician: Jacqulyn Bath ?Referring Jodilyn Giese: Margurite Auerbach ?Treating Amandalee Lacap/Extender: Jeri Cos ?Weeks in Treatment: 3 ?HBO Treatment Course Details ?Treatment Course Number: 1 ?Ordering Eboney Claybrook: Jeri Cos ?Total Treatments Ordered: 40 ?HBO Treatment Start Date: 06/12/2021 ?HBO Indication: ?Chronic Refractory Osteomyelitis to Right Foot ?HBO Treatment Details ?Treatment Number: 1 ?Patient Type: Outpatient ?Chamber Type: Monoplace ?Chamber Serial #: [id number] ?Treatment Protocol: 2.5 ATA with 90 minutes oxygen, with two 5 minute air breaks ?Treatment Details ?Compression Rate Down: 1.0 psi / minute ?De-Compression Rate Up: 2.0 psi / minute ?Compress Tx Pressure Air breaks and breathing periods Decompress Decompress ?Begins Reached (leave unused spaces blank) Begins Ends ?Chamber Pressure (ATA) 1 2.5 2.5 2.5 2.5 2.5 - - 2.5 1 ?Clock Time (24 hr) 14:30 - - - - - - - 15:00 15:13 ?Treatment Length: 43 (minutes) ?Treatment Segments: 1 ?Vital Signs ?Capillary Blood Glucose Reference Range: 80 - 120 mg / dl ?HBO Diabetic Blood Glucose Intervention Range: <131 mg/dl or >249 mg/dl ?Time Vitals Blood Respiratory Capillary Blood Glucose Pulse Action ?Type: ?Pulse: Temperature: ?Taken: ?Pressure: ?Rate: ?Glucose (mg/dl): Meter #: Oximetry (%) Taken: ?Pre 13:57 160/84 60 18 97.9 115 1 Gave Ensure per protocol ?Pre 14:25 172 1 none per protocol ?Pre-Treatment Ear Evaluation ?Left Right ?Clear: Yes ?Clear: Yes ?Intact: Yes ?Intact: Yes ?Color: grey ?Color: Grey ?PE Tubes inserted: No ?PE Tubes inserted: No ?Irrigated: No ?Irrigated: No ?Myringotomy performed: No ?Myringotomy performed: No ?Left Teed Scale:  Grade 0 ?Right Teed Scale: Grade 0 ?Treatment Response ?Treatment Toleration: Poor ?Treatment Completion ?Treatment Aborted/Not Restarted ?Status: ?Reason: Patient Choice ?Treatment Notes ?Patient was not able to tolerate being in the chamber as she stated she was hot , smothering, could not breathe, and needed to get out. Margarita Grizzle ?Stone, PA-C, Cornell Barman, RN, and myself attempted to talk her through her panic and for several minutes it appeared that she was overcoming it ?and concentrating on keeping her ears cleared. Suddenly when we had reached 2.25 ATA she stated "I want out NOW" and decompression was ?begun at a rate set of 2.0. She continued to panic until she was completely out of the chamber and had her oxygen cannula back on at 4 LPM. ?Martavis Gurney Notes ?[name], [name] ([id number]) ?Unfortunately this patient during the HBO treatment today had a significant panic attack at the start of her dive. Subsequently we did attempt to ?help to calm her down and hopefully by doing so allow her to complete the treatment. Nonetheless she continued to sweat profusely which was ?not ideal. She also told me that she was having a very hard time with the thought that she could not get out of the chamber as quickly as she ?wanted if indeed she did not feel like the treatment was going well. Subsequently after discussing all of this with her we opted to actually pull her ?out of the chamber and she never did reach the full 2.5 ATA today. It was decided that the patient is going go home she has had a rough day ?today anyway with several appointments and regular continue to consider repeating this going forward and see if we can get her back into the ?chamber. Nonetheless if she does do this I would recommend #1 that she go ahead and  take her antianxiety medication a little bit sooner at least ?an hour before she is supposed to get in the chamber. Next I would also suggest at this time that the patient should also probably be  lowered to ?2.0 ATA without air breaks to cut back on the time she will have to be in the chamber. Both of these I think are appropriate changes in order to ?try to get her treatments completed which I think still would be beneficial for her. We will discuss this with the patient after she has a chance to ?think about what she would like to do. ?Post Treatment Teed Score ?Post Treatment Teed Score: Left Ear Grade I ?Post Treatment Teed Score: Right Ear Grade 0 ?HBO Attestation ?I certify that I supervised this HBO treatment in accordance with ?Medicare guidelines. A trained emergency response team is readily Yes ?available per hospital policies and procedures. ?Continue HBOT as ordered. Yes ?Electronic Signature(s) ?Signed: 06/13/2021 8:21:27 AM By: Worthy Keeler PA-C ?Previous Signature: 06/12/2021 4:30:32 PM Version By: Enedina Finner RCP, RRT, CHT ?Previous Signature: 06/12/2021 5:00:17 PM Version By: Worthy Keeler PA-C ?Entered By: Worthy Keeler on 06/13/2021 08:21:27 ?[name], [name] ([id number]) ?-------------------------------------------------------------------------------- ?HBO Safety Checklist Details ?Patient Name: [name], [name] ?Date of Service: 06/12/2021 1:30 PM ?Medical Record Number: [id number] ?Patient Account Number: [id number] ?Date of Birth/Sex: August 26, 1970 (51 y.o. F) ?Treating RN: Cornell Barman ?Primary Care Elzie Knisley: Margurite Auerbach ?Other Clinician: Jacqulyn Bath ?Referring Onaje Warne: Margurite Auerbach ?Treating Abrian Hanover/Extender: Jeri Cos ?Weeks in Treatment: 3 ?HBO Safety Checklist Items ?Safety Checklist ?Consent Form Signed ?Patient voided / foley secured and emptied ?When did you last eato 13:30 ?Last dose of injectable or oral agent am ?Ostomy pouch emptied and vented if applicable ?NA ?All implantable devices assessed, documented and approved ?NA ?Intravenous access site secured and place ?NA ?Valuables secured ?Linens and cotton and cotton/polyester blend (less than 51%  polyester) ?Personal oil-based products / skin lotions / body lotions removed ?Wigs or hairpieces removed ?NA ?Smoking or tobacco materials removed ?NA ?Books / newspapers / magazines / loose paper removed ?NA ?Cologne, aftershave, perfume and deodorant removed ?Jewelry removed (may wrap wedding band) ?Make-up removed ?Hair care products removed ?Battery operated devices (external) removed ?NA ?Heating patches and chemical warmers removed ?NA ?Titanium eyewear removed ?NA ?Nail polish cured greater than 10 hours ?NA ?Casting material cured greater than 10 hours ?NA ?Hearing aids removed ?NA ?Loose dentures or partials removed ?NA ?Prosthetics have been removed ?NA ?Patient demonstrates correct use of air break device (if applicable) ?Patient concerns have been addressed ?Patient grounding bracelet on and cord attached to chamber ?Specifics for Inpatients (complete in addition to ?above) ?Medication sheet sent with patient ?Intravenous medications needed or due during therapy sent with patient ?Drainage tubes (e.g. nasogastric tube or chest tube secured and vented) ?Endotracheal or Tracheotomy tube secured ?Cuff deflated of air and inflated with saline ?Airway suctioned ?Electronic Signature(s) ?Signed: 06/12/2021 4:30:32 PM By: Enedina Finner RCP, RRT, CHT ?Entered By: Enedina Finner on 06/12/2021 16:16:10 ?

## 2021-06-13 NOTE — Progress Notes (Signed)
[name], [name] CAPITO. ([id number]) ?Visit Report for 06/12/2021 ?Problem List Details ?Patient Name: [name], [name] ?Date of Service: 06/12/2021 1:30 PM ?Medical Record Number: [id number] ?Patient Account Number: [id number] ?Date of Birth/Sex: January 24, 1971 (51 y.o. F) ?Treating RN: Cornell Barman ?Primary Care Provider: Margurite Auerbach ?Other Clinician: Jacqulyn Bath ?Referring Provider: Margurite Auerbach ?Treating Provider/Extender: Jeri Cos ?Weeks in Treatment: 3 ?Active Problems ?ICD-10 ?Encounter ?Code Description Active Date MDM ?Diagnosis ?M86.371 Chronic multifocal osteomyelitis, right ankle and foot 05/18/2021 No Yes ?E11.621 Type 2 diabetes mellitus with foot ulcer 05/18/2021 No Yes ?L97.514 Non-pressure chronic ulcer of other part of right foot with necrosis of 05/18/2021 No Yes ?bone ?I50.42 Chronic combined systolic (congestive) and diastolic (congestive) heart 05/18/2021 No Yes ?failure ?I10 Essential (primary) hypertension 05/18/2021 No Yes ?N18.6 End stage renal disease 05/18/2021 No Yes ?Z99.2 Dependence on renal dialysis 05/18/2021 No Yes ?Z99.81 Dependence on supplemental oxygen 05/18/2021 No Yes ?Inactive Problems ?Resolved Problems ?Electronic Signature(s) ?Signed: 06/13/2021 8:16:57 AM By: Worthy Keeler PA-C ?Entered By: Worthy Keeler on 06/13/2021 08:16:57 ?[name], [name] ([id number]) ?-------------------------------------------------------------------------------- ?SuperBill Details ?Patient Name: [name], [name] ?Date of Service: 06/12/2021 ?Medical Record Number: [id number] ?Patient Account Number: [id number] ?Date of Birth/Sex: Mar 18, 1970 (51 y.o. F) ?Treating RN: Cornell Barman ?Primary Care Provider: Margurite Auerbach ?Other Clinician: Jacqulyn Bath ?Referring Provider: Margurite Auerbach ?Treating Provider/Extender: Jeri Cos ?Weeks in Treatment: 3 ?Diagnosis Coding ?ICD-10 Codes ?Code Description ?M86.371 Chronic multifocal osteomyelitis, right ankle and foot ?E11.621 Type 2 diabetes mellitus with foot  ulcer ?L97.514 Non-pressure chronic ulcer of other part of right foot with necrosis of bone ?I50.42 Chronic combined systolic (congestive) and diastolic (congestive) heart failure ?I10 Essential (primary) hypertension ?N18.6 End stage renal disease ?Z99.2 Dependence on renal dialysis ?Z99.81 Dependence on supplemental oxygen ?Facility Procedures ?CPT4 Code: [id number] ?Description: (Facility Use Only) HBOT, full body chamber, 34mn ?Modifier: ?Quantity: 1 ?CPT4 Code: ?Description: ICD-10 Diagnosis Description M86.371 Chronic multifocal osteomyelitis, right ankle and foot L97.514 Non-pressure chronic ulcer of other part of right foot with necrosis of bo E11.621 Type 2 diabetes mellitus with foot ulcer ?Modifier: ne ?Quantity: ?Electronic Signature(s) ?Signed: 06/13/2021 8:21:36 AM By: SWorthy KeelerPA-C ?Previous Signature: 06/12/2021 4:30:32 PM Version By: WEnedina FinnerRCP, RRT, CHT ?Previous Signature: 06/12/2021 5:00:17 PM Version By: SWorthy KeelerPA-C ?Entered By: SWorthy Keeleron 06/13/2021 08:21:36 ?

## 2021-06-13 NOTE — Progress Notes (Signed)
Cardiology Office Note ? ?Date:  06/18/2021  ? ?ID:  [name], DOB 11-17-1970, MRN [id number] ? ?PCP:  [name], NP  ? ?Chief Complaint  ?Patient presents with  ? 12 month follow up   ?  Patient c/o shortness of breath, LE edema and chest tightness/pressure. Medications reviewed by the patient verbally.   ? ? ?HPI:  ?[name] is a 51 yo woman with medical history of  ?morbidly obese  ?uncontrolled diabetes   ?chronic diastolic CHF,  ?anemia,  ?Asthma ?Morbid obesity ?ESRD on HD 3w a week, started 03/2017 ?Osteomyelitis foot ?who presents for  follow-up of her diastolic CHF. ? ?Last seen in clinic August 2021 ?Covid 02/2019,  ? ?Echocardiogram September 2022 ?Ejection fraction 60 to 88%, grade 2 diastolic dysfunction ?Severely elevated right heart pressures/pulmonary hypertension 78 mmHg ? ?Followed by Dr. Patsey Berthold for her pulmonary issues, asthmatic bronchitis ? ?Hospitalized April 21, 2021 for pancreatitis, abdominal pain ? ?Lab work reviewed ?A1c improved 6.2 down from 8-10 ? ?On discussion of her osteomyelitis, she reports foot is "coming along",  ?Doing hyperbaric chamber, gets anxiety in chamber ?questioning whether to proceed with further treatments ? ?Dr. Candiss Norse follows ESRD ? ?Trying to get out of many of her medications  ?stopped celexa, stopped statin on her own ?Reports she is out of her clonidine and metoprolol, taking these ?But does report blood pressure has been running high at times, not on dialysis days ? ?Prior hospitalization records reviewed ? hospitalized 10/15/2020 to 11/10/2020 secondary to gangrene of the right foot with osteomyelitis status post fifth ray amputation and I&D on 8/27 as well as washout procedure 9/1.  She was recommended for IV vancomycin, cefepime for 4 weeks, end date 9/28. ? ?Readmission for CHF in 9/22 ? ?Weight down , off ozempic ?Changed diet ? ?On chronic oxygen, 3 liters, followed by gonzalez ? ?EKG personally reviewed by myself on todays visit  ?Nsr  rate 92 bpm  poor r wave progression ? ?Past history reviewed ?Hx of diabetic foot ulcer requiring prolonged period of antibiotics ?Details per ID notes ? August  2022 was in the hospital with right diabetic foot infection with necrosis and osteomyelitis.  She underwent amputation of the fifth ray on 10/21/2020.  This was followed by a washout procedure on 10/26/2020.  As the foot did not appear salvageable amputation was recommended but podiatrist.  She declined.  She took IV vancomycin and cefepime for 8 weeks until 12/19/2020 during dialysis. ?Diabetic foot infection- necrotic with osteomyelitis- amputation of fifth ray and I&D on 8/27 ?-Washout procedure on 10/26/2020 ?-Recommended amputation which patient had declined  ?-Took  IV vancomycin and cefepime with dialysis ?-for 8 weeks until 12/19/20 ?I had seen her on 02/22/2021 and recommended no antibiotics as there was no signs of acute infection. ?-Repeat MRI February 2023 showing osteo ? ?PMH:   has a past medical history of (HFpEF) heart failure with preserved ejection fraction (HCC), Anxiety, Asthma, Bronchitis, CHF (congestive heart failure) ([place]), Chronic cough, Chronic respiratory failure with hypoxia and hypercapnia ([place]), CKD (chronic kidney disease), stage III ([place]), Depression, Diabetes mellitus without complication ([place]), Diverticulosis, Dyspnea, Dysrhythmia, Environmental and seasonal allergies, Extreme obesity with alveolar hypoventilation ([place]), Hypertension, Iron deficiency anemia, Kidney failure, Orthopnea, Oxygen dependent, Pericardial effusion, Pneumonia, Poorly controlled diabetes mellitus ([place]), and Swelling of both lower extremities. ? ?PSH:    ?Past Surgical History:  ?Procedure Laterality Date  ? A/V FISTULAGRAM Left 06/09/2017  ? Procedure: A/V FISTULAGRAM;  Surgeon: [name]  S, MD;  Location: [facility];  Service: Cardiovascular;  Laterality: Left;  ? A/V FISTULAGRAM Left 06/16/2017  ? Procedure: A/V FISTULAGRAM;  Surgeon: Algernon Huxley, MD;  Location: [facility];  Service: Cardiovascular;  Laterality: Left;  ? A/V FISTULAGRAM Left 01/28/2018  ? Procedure: A/V FISTULAGRAM;  Surgeon: Algernon Huxley, MD;  Location: [facility];  Service: Cardiovascular;  Laterality: Left;  ? AMPUTATION TOE Right 10/21/2020  ? Procedure: AMPUTATION TOE-5th Digit Amputation Right Foot;  Surgeon: Edrick Kins, DPM;  Location: [facility];  Service: Podiatry;  Laterality: Right;  ? AV FISTULA PLACEMENT Left 04/25/2017  ? Procedure: ARTERIOVENOUS (AV) FISTULA CREATION;  Surgeon: Algernon Huxley, MD;  Location: [facility];  Service: Vascular;  Laterality: Left;  ? BONE BIOPSY Right 04/27/2021  ? Procedure: Right 4th metatarsal BONE BIOPSY;  Surgeon: [name], DPM;  Location: [facility];  Service: Podiatry;  Laterality: Right;  ? CATARACT EXTRACTION W/PHACO Left 01/21/2018  ? Procedure: CATARACT EXTRACTION PHACO AND INTRAOCULAR LENS PLACEMENT (IOC);  Surgeon: Leandrew Koyanagi, MD;  Location: [facility];  Service: Ophthalmology;  Laterality: Left;  [place] 01:55.5 ?CDE 9.05 ?EAUP 13.9 ?Fluid Pack Lot # [id number]  ? CESAREAN SECTION    ? times 3  ? DIALYSIS/PERMA CATHETER INSERTION Right 04/04/2017  ? Procedure: DIALYSIS/PERMA CATHETER INSERTION;  Surgeon: Katha Cabal, MD;  Location: [place] [facility];  Service: Cardiovascular;  Laterality: Right;  ? DIALYSIS/PERMA CATHETER REMOVAL N/A 09/17/2017  ? Procedure: DIALYSIS/PERMA CATHETER REMOVAL;  Surgeon: Algernon Huxley, MD;  Location: [place];  Service: Cardiovascular;  Laterality: N/A;  ? INCISION AND DRAINAGE Right 10/26/2020  ? Procedure: INCISION AND DRAINAGE;  Surgeon: [name], DPM;  Location: [facility];  Service: Podiatry;  Laterality: Right;  ? INSERTION OF DIALYSIS CATHETER N/A 04/16/2017  ? Procedure: INSERTION OF DIALYSIS PERM CATHETER;  Surgeon: Algernon Huxley, MD;  Location: [facility];  Service: Vascular;  Laterality: N/A;  ? LOWER EXTREMITY ANGIOGRAPHY Right 10/18/2020  ? Procedure:  Lower Extremity Angiography;  Surgeon: Algernon Huxley, MD;  Location: [facility];  Service: Cardiovascular;  Laterality: Right;  ? ? ?Current Outpatient Medications  ?Medication Sig Dispense Refill  ? acetaminophen (TYLENOL) 500 MG tablet Take 1,000 mg by mouth every 6 (six) hours as needed for mild pain, fever or headache.    ? albuterol (PROVENTIL) (2.5 MG/3ML) 0.083% nebulizer solution Take 3 mLs (2.5 mg total) by nebulization every 6 (six) hours as needed for wheezing or shortness of breath. 75 mL 12  ? albuterol (VENTOLIN HFA) 108 (90 Base) MCG/ACT inhaler INHALE 2 PUFFS BY MOUTH EVERY 4 HOURS AS NEEDED FOR WHEEZING FOR SHORTNESS OF BREATH 18 g 3  ? aspirin EC 81 MG tablet Take 81 mg by mouth daily.     ? b complex-vitamin c-folic acid (NEPHRO-VITE) 0.8 MG TABS tablet Take 1 tablet by mouth at bedtime.    ? budesonide (PULMICORT) 0.25 MG/2ML nebulizer solution Take 2 mLs (0.25 mg total) by nebulization 2 (two) times daily. 60 mL 6  ? budesonide (PULMICORT) 0.5 MG/2ML nebulizer solution Take by nebulization.    ? ceFEPime (MAXIPIME) IVPB Inject 2 g into the vein Every Tuesday,Thursday,and Saturday with dialysis. Cefepime 2gm with hemodialysis on Tue, Thur, Sat ?Indication: diabetic foot infection ?Last Day of Therapy:  06/11/2021 ?Labs - Once weekly:  CBC/D, CMP, and vancomycin trough 18 Units   ? ceFEPIme 2 g in sodium chloride 0.9 %  100 mL Inject 2 g into the vein Every Tuesday,Thursday,and Saturday with dialysis.    ? Cholecalciferol 100 MCG (4000 UT) CAPS Take 1 capsule by mouth once a week.    ? citalopram (CELEXA) 20 MG tablet Take 20 mg by mouth at bedtime.    ? clindamycin (CLEOCIN T) 1 % lotion Apply 1 application topically daily.    ? ferric citrate (AURYXIA) 1 GM 210 MG(Fe) tablet Take 210 mg by mouth 3 (three) times daily with meals.    ? formoterol (PERFOROMIST) 20 MCG/2ML nebulizer solution     ? insulin aspart (NOVOLOG) 100 UNIT/ML injection Inject 0-15 Units into the skin 3 (three) times  daily with meals. 10 mL 11  ? insulin degludec (TRESIBA) 100 UNIT/ML FlexTouch Pen Inject 30 Units into the skin daily.    ? insulin lispro (HUMALOG) 100 UNIT/ML KwikPen Inject into the skin.    ? lactulos

## 2021-06-13 NOTE — Telephone Encounter (Signed)
Spoke with patient, notified her that labs are stable and that provider is discontinuing IV antibiotics. Patient verbalized understanding and has no further questions.  ? ?Relayed verbal orders to Judd Gaudier, RN at [facility] to discontinue IV vancomycin and cefepime per Dr. Delaine Lame. ? ?[facility]: [phone] ? ?Beryle Flock, RN ? ?

## 2021-06-13 NOTE — Telephone Encounter (Signed)
-----   Message from Tsosie Billing, MD sent at 06/13/2021  9:49 AM EDT ----- ?Please let her know that the labs drawn yesterday are all stable- She does not need any more vanco or cefepime which she gets at the dialysis center- thx ?----- Message ----- ?From: Interface, Lab In [place] ?Sent: 06/12/2021  12:27 PM EDT ?To: Tsosie Billing, MD ? ? ?

## 2021-06-14 ENCOUNTER — Encounter: Payer: Medicare Other | Admitting: Physician Assistant

## 2021-06-14 DIAGNOSIS — B952 Enterococcus as the cause of diseases classified elsewhere: Secondary | ICD-10-CM | POA: Diagnosis not present

## 2021-06-14 DIAGNOSIS — N186 End stage renal disease: Secondary | ICD-10-CM | POA: Diagnosis not present

## 2021-06-14 DIAGNOSIS — L989 Disorder of the skin and subcutaneous tissue, unspecified: Secondary | ICD-10-CM | POA: Diagnosis not present

## 2021-06-14 DIAGNOSIS — M869 Osteomyelitis, unspecified: Secondary | ICD-10-CM | POA: Diagnosis not present

## 2021-06-14 DIAGNOSIS — M86171 Other acute osteomyelitis, right ankle and foot: Secondary | ICD-10-CM | POA: Diagnosis not present

## 2021-06-14 DIAGNOSIS — B9689 Other specified bacterial agents as the cause of diseases classified elsewhere: Secondary | ICD-10-CM | POA: Diagnosis not present

## 2021-06-14 DIAGNOSIS — B951 Streptococcus, group B, as the cause of diseases classified elsewhere: Secondary | ICD-10-CM | POA: Diagnosis not present

## 2021-06-14 DIAGNOSIS — Z992 Dependence on renal dialysis: Secondary | ICD-10-CM | POA: Diagnosis not present

## 2021-06-15 ENCOUNTER — Encounter: Payer: Medicare Other | Admitting: Physician Assistant

## 2021-06-16 DIAGNOSIS — B952 Enterococcus as the cause of diseases classified elsewhere: Secondary | ICD-10-CM | POA: Diagnosis not present

## 2021-06-16 DIAGNOSIS — N186 End stage renal disease: Secondary | ICD-10-CM | POA: Diagnosis not present

## 2021-06-16 DIAGNOSIS — B9689 Other specified bacterial agents as the cause of diseases classified elsewhere: Secondary | ICD-10-CM | POA: Diagnosis not present

## 2021-06-16 DIAGNOSIS — L989 Disorder of the skin and subcutaneous tissue, unspecified: Secondary | ICD-10-CM | POA: Diagnosis not present

## 2021-06-16 DIAGNOSIS — M869 Osteomyelitis, unspecified: Secondary | ICD-10-CM | POA: Diagnosis not present

## 2021-06-16 DIAGNOSIS — Z992 Dependence on renal dialysis: Secondary | ICD-10-CM | POA: Diagnosis not present

## 2021-06-16 DIAGNOSIS — B951 Streptococcus, group B, as the cause of diseases classified elsewhere: Secondary | ICD-10-CM | POA: Diagnosis not present

## 2021-06-16 DIAGNOSIS — M86171 Other acute osteomyelitis, right ankle and foot: Secondary | ICD-10-CM | POA: Diagnosis not present

## 2021-06-18 ENCOUNTER — Encounter: Payer: Medicare Other | Admitting: Physician Assistant

## 2021-06-18 ENCOUNTER — Encounter: Payer: Self-pay | Admitting: Cardiovascular Disease

## 2021-06-18 ENCOUNTER — Ambulatory Visit (INDEPENDENT_AMBULATORY_CARE_PROVIDER_SITE_OTHER): Payer: Medicare Other | Admitting: Cardiovascular Disease

## 2021-06-18 VITALS — BP 140/88 | HR 92 | Ht 64.5 in | Wt 258.0 lb

## 2021-06-18 DIAGNOSIS — I5032 Chronic diastolic (congestive) heart failure: Secondary | ICD-10-CM

## 2021-06-18 DIAGNOSIS — E1151 Type 2 diabetes mellitus with diabetic peripheral angiopathy without gangrene: Secondary | ICD-10-CM | POA: Diagnosis not present

## 2021-06-18 DIAGNOSIS — I10 Essential (primary) hypertension: Secondary | ICD-10-CM | POA: Diagnosis not present

## 2021-06-18 DIAGNOSIS — I272 Pulmonary hypertension, unspecified: Secondary | ICD-10-CM

## 2021-06-18 DIAGNOSIS — I132 Hypertensive heart and chronic kidney disease with heart failure and with stage 5 chronic kidney disease, or end stage renal disease: Secondary | ICD-10-CM | POA: Diagnosis not present

## 2021-06-18 DIAGNOSIS — I110 Hypertensive heart disease with heart failure: Secondary | ICD-10-CM

## 2021-06-18 DIAGNOSIS — I5033 Acute on chronic diastolic (congestive) heart failure: Secondary | ICD-10-CM

## 2021-06-18 DIAGNOSIS — I739 Peripheral vascular disease, unspecified: Secondary | ICD-10-CM

## 2021-06-18 DIAGNOSIS — Z992 Dependence on renal dialysis: Secondary | ICD-10-CM

## 2021-06-18 DIAGNOSIS — D631 Anemia in chronic kidney disease: Secondary | ICD-10-CM | POA: Diagnosis not present

## 2021-06-18 DIAGNOSIS — E1122 Type 2 diabetes mellitus with diabetic chronic kidney disease: Secondary | ICD-10-CM

## 2021-06-18 DIAGNOSIS — Z794 Long term (current) use of insulin: Secondary | ICD-10-CM | POA: Diagnosis not present

## 2021-06-18 DIAGNOSIS — Z9981 Dependence on supplemental oxygen: Secondary | ICD-10-CM | POA: Diagnosis not present

## 2021-06-18 DIAGNOSIS — L97512 Non-pressure chronic ulcer of other part of right foot with fat layer exposed: Secondary | ICD-10-CM | POA: Diagnosis not present

## 2021-06-18 DIAGNOSIS — N186 End stage renal disease: Secondary | ICD-10-CM | POA: Diagnosis not present

## 2021-06-18 DIAGNOSIS — J449 Chronic obstructive pulmonary disease, unspecified: Secondary | ICD-10-CM | POA: Diagnosis not present

## 2021-06-18 DIAGNOSIS — E11621 Type 2 diabetes mellitus with foot ulcer: Secondary | ICD-10-CM | POA: Diagnosis not present

## 2021-06-18 DIAGNOSIS — J9611 Chronic respiratory failure with hypoxia: Secondary | ICD-10-CM | POA: Diagnosis not present

## 2021-06-18 MED ORDER — CLONIDINE HCL 0.1 MG PO TABS: 0.1000 mg | ORAL_TABLET | Freq: Two times a day (BID) | ORAL | 11 refills | Status: DC

## 2021-06-18 MED ORDER — METOPROLOL TARTRATE 50 MG PO TABS: 50.0000 mg | ORAL_TABLET | Freq: Two times a day (BID) | ORAL | 3 refills | Status: DC

## 2021-06-18 NOTE — Patient Instructions (Addendum)
Medication Instructions:  ?Clonidine 0.1 mg twice a day for high pressure ?Metoprolol tartrate 50 mg twice a day for high blood pressure ?Hold on dialysis days ? ?If you need a refill on your cardiac medications before your next appointment, please call your pharmacy.  ? ?Lab work: ?No new labs needed ? ?Testing/Procedures: ?No new testing needed ? ?Follow-Up: ?At [facility], you and your health needs are our priority.  As part of our continuing mission to provide you with exceptional heart care, we have created designated Provider Care Teams.  These Care Teams include your primary Cardiologist (physician) and Advanced Practice Providers (APPs -  Physician Assistants and Nurse Practitioners) who all work together to provide you with the care you need, when you need it. ? ?You will need a follow up appointment in 6 months ? ?Providers on your designated Care Team:   ?Murray Hodgkins, NP ?Christell Faith, PA-C ?Cadence Kathlen Mody, PA-C ? ?COVID-19 Vaccine Information can be found at: [email or web address].co.uk For questions related to vaccine distribution or appointments, please email [email or web address] or call [phone].  ? ?

## 2021-06-19 ENCOUNTER — Encounter: Payer: Medicare Other | Admitting: Physician Assistant

## 2021-06-19 DIAGNOSIS — E1122 Type 2 diabetes mellitus with diabetic chronic kidney disease: Secondary | ICD-10-CM | POA: Diagnosis not present

## 2021-06-19 DIAGNOSIS — B9689 Other specified bacterial agents as the cause of diseases classified elsewhere: Secondary | ICD-10-CM | POA: Diagnosis not present

## 2021-06-19 DIAGNOSIS — M869 Osteomyelitis, unspecified: Secondary | ICD-10-CM | POA: Diagnosis not present

## 2021-06-19 DIAGNOSIS — B952 Enterococcus as the cause of diseases classified elsewhere: Secondary | ICD-10-CM | POA: Diagnosis not present

## 2021-06-19 DIAGNOSIS — M86171 Other acute osteomyelitis, right ankle and foot: Secondary | ICD-10-CM | POA: Diagnosis not present

## 2021-06-19 DIAGNOSIS — L989 Disorder of the skin and subcutaneous tissue, unspecified: Secondary | ICD-10-CM | POA: Diagnosis not present

## 2021-06-19 DIAGNOSIS — N186 End stage renal disease: Secondary | ICD-10-CM | POA: Diagnosis not present

## 2021-06-19 DIAGNOSIS — Z992 Dependence on renal dialysis: Secondary | ICD-10-CM | POA: Diagnosis not present

## 2021-06-19 DIAGNOSIS — B951 Streptococcus, group B, as the cause of diseases classified elsewhere: Secondary | ICD-10-CM | POA: Diagnosis not present

## 2021-06-19 DIAGNOSIS — Z794 Long term (current) use of insulin: Secondary | ICD-10-CM | POA: Diagnosis not present

## 2021-06-20 ENCOUNTER — Encounter: Payer: Medicare Other | Admitting: Internal Medicine

## 2021-06-21 ENCOUNTER — Encounter: Payer: Medicare Other | Admitting: Internal Medicine

## 2021-06-21 DIAGNOSIS — B952 Enterococcus as the cause of diseases classified elsewhere: Secondary | ICD-10-CM | POA: Diagnosis not present

## 2021-06-21 DIAGNOSIS — B951 Streptococcus, group B, as the cause of diseases classified elsewhere: Secondary | ICD-10-CM | POA: Diagnosis not present

## 2021-06-21 DIAGNOSIS — J449 Chronic obstructive pulmonary disease, unspecified: Secondary | ICD-10-CM | POA: Diagnosis not present

## 2021-06-21 DIAGNOSIS — Z992 Dependence on renal dialysis: Secondary | ICD-10-CM | POA: Diagnosis not present

## 2021-06-21 DIAGNOSIS — L989 Disorder of the skin and subcutaneous tissue, unspecified: Secondary | ICD-10-CM | POA: Diagnosis not present

## 2021-06-21 DIAGNOSIS — N186 End stage renal disease: Secondary | ICD-10-CM | POA: Diagnosis not present

## 2021-06-21 DIAGNOSIS — B9689 Other specified bacterial agents as the cause of diseases classified elsewhere: Secondary | ICD-10-CM | POA: Diagnosis not present

## 2021-06-21 DIAGNOSIS — M869 Osteomyelitis, unspecified: Secondary | ICD-10-CM | POA: Diagnosis not present

## 2021-06-21 DIAGNOSIS — M86171 Other acute osteomyelitis, right ankle and foot: Secondary | ICD-10-CM | POA: Diagnosis not present

## 2021-06-22 ENCOUNTER — Encounter: Payer: Medicare Other | Admitting: Physician Assistant

## 2021-06-22 DIAGNOSIS — E1122 Type 2 diabetes mellitus with diabetic chronic kidney disease: Secondary | ICD-10-CM | POA: Diagnosis not present

## 2021-06-22 DIAGNOSIS — D631 Anemia in chronic kidney disease: Secondary | ICD-10-CM | POA: Diagnosis not present

## 2021-06-22 DIAGNOSIS — Z9981 Dependence on supplemental oxygen: Secondary | ICD-10-CM | POA: Diagnosis not present

## 2021-06-22 DIAGNOSIS — E11621 Type 2 diabetes mellitus with foot ulcer: Secondary | ICD-10-CM | POA: Diagnosis not present

## 2021-06-22 DIAGNOSIS — I132 Hypertensive heart and chronic kidney disease with heart failure and with stage 5 chronic kidney disease, or end stage renal disease: Secondary | ICD-10-CM | POA: Diagnosis not present

## 2021-06-22 DIAGNOSIS — Z992 Dependence on renal dialysis: Secondary | ICD-10-CM | POA: Diagnosis not present

## 2021-06-22 DIAGNOSIS — N186 End stage renal disease: Secondary | ICD-10-CM | POA: Diagnosis not present

## 2021-06-22 DIAGNOSIS — I5032 Chronic diastolic (congestive) heart failure: Secondary | ICD-10-CM | POA: Diagnosis not present

## 2021-06-22 DIAGNOSIS — E1151 Type 2 diabetes mellitus with diabetic peripheral angiopathy without gangrene: Secondary | ICD-10-CM | POA: Diagnosis not present

## 2021-06-22 DIAGNOSIS — Z794 Long term (current) use of insulin: Secondary | ICD-10-CM | POA: Diagnosis not present

## 2021-06-22 DIAGNOSIS — L97512 Non-pressure chronic ulcer of other part of right foot with fat layer exposed: Secondary | ICD-10-CM | POA: Diagnosis not present

## 2021-06-23 DIAGNOSIS — L989 Disorder of the skin and subcutaneous tissue, unspecified: Secondary | ICD-10-CM | POA: Diagnosis not present

## 2021-06-23 DIAGNOSIS — B952 Enterococcus as the cause of diseases classified elsewhere: Secondary | ICD-10-CM | POA: Diagnosis not present

## 2021-06-23 DIAGNOSIS — Z992 Dependence on renal dialysis: Secondary | ICD-10-CM | POA: Diagnosis not present

## 2021-06-23 DIAGNOSIS — M869 Osteomyelitis, unspecified: Secondary | ICD-10-CM | POA: Diagnosis not present

## 2021-06-23 DIAGNOSIS — M86171 Other acute osteomyelitis, right ankle and foot: Secondary | ICD-10-CM | POA: Diagnosis not present

## 2021-06-23 DIAGNOSIS — N186 End stage renal disease: Secondary | ICD-10-CM | POA: Diagnosis not present

## 2021-06-23 DIAGNOSIS — B951 Streptococcus, group B, as the cause of diseases classified elsewhere: Secondary | ICD-10-CM | POA: Diagnosis not present

## 2021-06-23 DIAGNOSIS — B9689 Other specified bacterial agents as the cause of diseases classified elsewhere: Secondary | ICD-10-CM | POA: Diagnosis not present

## 2021-06-24 DIAGNOSIS — Z992 Dependence on renal dialysis: Secondary | ICD-10-CM | POA: Diagnosis not present

## 2021-06-24 DIAGNOSIS — N186 End stage renal disease: Secondary | ICD-10-CM | POA: Diagnosis not present

## 2021-06-25 ENCOUNTER — Encounter: Payer: Medicare Other | Admitting: Physician Assistant

## 2021-06-25 DIAGNOSIS — Z9981 Dependence on supplemental oxygen: Secondary | ICD-10-CM | POA: Diagnosis not present

## 2021-06-25 DIAGNOSIS — I132 Hypertensive heart and chronic kidney disease with heart failure and with stage 5 chronic kidney disease, or end stage renal disease: Secondary | ICD-10-CM | POA: Diagnosis not present

## 2021-06-25 DIAGNOSIS — L97512 Non-pressure chronic ulcer of other part of right foot with fat layer exposed: Secondary | ICD-10-CM | POA: Diagnosis not present

## 2021-06-25 DIAGNOSIS — E1122 Type 2 diabetes mellitus with diabetic chronic kidney disease: Secondary | ICD-10-CM | POA: Diagnosis not present

## 2021-06-25 DIAGNOSIS — J45909 Unspecified asthma, uncomplicated: Secondary | ICD-10-CM | POA: Diagnosis not present

## 2021-06-25 DIAGNOSIS — E1151 Type 2 diabetes mellitus with diabetic peripheral angiopathy without gangrene: Secondary | ICD-10-CM | POA: Diagnosis not present

## 2021-06-25 DIAGNOSIS — E11621 Type 2 diabetes mellitus with foot ulcer: Secondary | ICD-10-CM | POA: Diagnosis not present

## 2021-06-25 DIAGNOSIS — D631 Anemia in chronic kidney disease: Secondary | ICD-10-CM | POA: Diagnosis not present

## 2021-06-25 DIAGNOSIS — Z794 Long term (current) use of insulin: Secondary | ICD-10-CM | POA: Diagnosis not present

## 2021-06-25 DIAGNOSIS — N186 End stage renal disease: Secondary | ICD-10-CM | POA: Diagnosis not present

## 2021-06-25 DIAGNOSIS — I5040 Unspecified combined systolic (congestive) and diastolic (congestive) heart failure: Secondary | ICD-10-CM | POA: Diagnosis not present

## 2021-06-25 DIAGNOSIS — Z992 Dependence on renal dialysis: Secondary | ICD-10-CM | POA: Diagnosis not present

## 2021-06-25 DIAGNOSIS — I5032 Chronic diastolic (congestive) heart failure: Secondary | ICD-10-CM | POA: Diagnosis not present

## 2021-06-26 ENCOUNTER — Encounter: Payer: Medicare Other | Admitting: Physician Assistant

## 2021-06-26 ENCOUNTER — Other Ambulatory Visit: Payer: Self-pay | Admitting: Nurse Practitioner

## 2021-06-26 DIAGNOSIS — M79604 Pain in right leg: Secondary | ICD-10-CM

## 2021-06-26 DIAGNOSIS — Z992 Dependence on renal dialysis: Secondary | ICD-10-CM | POA: Diagnosis not present

## 2021-06-26 DIAGNOSIS — N186 End stage renal disease: Secondary | ICD-10-CM | POA: Diagnosis not present

## 2021-06-27 ENCOUNTER — Encounter: Payer: Medicare Other | Admitting: Internal Medicine

## 2021-06-27 DIAGNOSIS — Z794 Long term (current) use of insulin: Secondary | ICD-10-CM | POA: Diagnosis not present

## 2021-06-27 DIAGNOSIS — I5032 Chronic diastolic (congestive) heart failure: Secondary | ICD-10-CM | POA: Diagnosis not present

## 2021-06-27 DIAGNOSIS — Z992 Dependence on renal dialysis: Secondary | ICD-10-CM | POA: Diagnosis not present

## 2021-06-27 DIAGNOSIS — E1151 Type 2 diabetes mellitus with diabetic peripheral angiopathy without gangrene: Secondary | ICD-10-CM | POA: Diagnosis not present

## 2021-06-27 DIAGNOSIS — Z9981 Dependence on supplemental oxygen: Secondary | ICD-10-CM | POA: Diagnosis not present

## 2021-06-27 DIAGNOSIS — E1122 Type 2 diabetes mellitus with diabetic chronic kidney disease: Secondary | ICD-10-CM | POA: Diagnosis not present

## 2021-06-27 DIAGNOSIS — L97512 Non-pressure chronic ulcer of other part of right foot with fat layer exposed: Secondary | ICD-10-CM | POA: Diagnosis not present

## 2021-06-27 DIAGNOSIS — N186 End stage renal disease: Secondary | ICD-10-CM | POA: Diagnosis not present

## 2021-06-27 DIAGNOSIS — E11621 Type 2 diabetes mellitus with foot ulcer: Secondary | ICD-10-CM | POA: Diagnosis not present

## 2021-06-27 DIAGNOSIS — D631 Anemia in chronic kidney disease: Secondary | ICD-10-CM | POA: Diagnosis not present

## 2021-06-27 DIAGNOSIS — I132 Hypertensive heart and chronic kidney disease with heart failure and with stage 5 chronic kidney disease, or end stage renal disease: Secondary | ICD-10-CM | POA: Diagnosis not present

## 2021-06-28 ENCOUNTER — Encounter: Payer: Medicare Other | Admitting: Physician Assistant

## 2021-06-28 DIAGNOSIS — N186 End stage renal disease: Secondary | ICD-10-CM | POA: Diagnosis not present

## 2021-06-28 DIAGNOSIS — Z992 Dependence on renal dialysis: Secondary | ICD-10-CM | POA: Diagnosis not present

## 2021-06-29 ENCOUNTER — Encounter: Payer: Medicare Other | Admitting: Physician Assistant

## 2021-06-29 DIAGNOSIS — E1151 Type 2 diabetes mellitus with diabetic peripheral angiopathy without gangrene: Secondary | ICD-10-CM | POA: Diagnosis not present

## 2021-06-29 DIAGNOSIS — I5032 Chronic diastolic (congestive) heart failure: Secondary | ICD-10-CM | POA: Diagnosis not present

## 2021-06-29 DIAGNOSIS — L97512 Non-pressure chronic ulcer of other part of right foot with fat layer exposed: Secondary | ICD-10-CM | POA: Diagnosis not present

## 2021-06-29 DIAGNOSIS — I132 Hypertensive heart and chronic kidney disease with heart failure and with stage 5 chronic kidney disease, or end stage renal disease: Secondary | ICD-10-CM | POA: Diagnosis not present

## 2021-06-29 DIAGNOSIS — Z794 Long term (current) use of insulin: Secondary | ICD-10-CM | POA: Diagnosis not present

## 2021-06-29 DIAGNOSIS — D631 Anemia in chronic kidney disease: Secondary | ICD-10-CM | POA: Diagnosis not present

## 2021-06-29 DIAGNOSIS — E11621 Type 2 diabetes mellitus with foot ulcer: Secondary | ICD-10-CM | POA: Diagnosis not present

## 2021-06-29 DIAGNOSIS — E1122 Type 2 diabetes mellitus with diabetic chronic kidney disease: Secondary | ICD-10-CM | POA: Diagnosis not present

## 2021-06-29 DIAGNOSIS — Z992 Dependence on renal dialysis: Secondary | ICD-10-CM | POA: Diagnosis not present

## 2021-06-29 DIAGNOSIS — N186 End stage renal disease: Secondary | ICD-10-CM | POA: Diagnosis not present

## 2021-06-29 DIAGNOSIS — Z9981 Dependence on supplemental oxygen: Secondary | ICD-10-CM | POA: Diagnosis not present

## 2021-06-30 DIAGNOSIS — Z992 Dependence on renal dialysis: Secondary | ICD-10-CM | POA: Diagnosis not present

## 2021-06-30 DIAGNOSIS — N186 End stage renal disease: Secondary | ICD-10-CM | POA: Diagnosis not present

## 2021-07-03 DIAGNOSIS — Z992 Dependence on renal dialysis: Secondary | ICD-10-CM | POA: Diagnosis not present

## 2021-07-03 DIAGNOSIS — N186 End stage renal disease: Secondary | ICD-10-CM | POA: Diagnosis not present

## 2021-07-04 ENCOUNTER — Ambulatory Visit
Admission: RE | Admit: 2021-07-04 | Discharge: 2021-07-04 | Disposition: A | Discharge status: Home / Self Care | Payer: Medicare Other | Source: Ambulatory Visit | Attending: Nurse Practitioner | Admitting: Nurse Practitioner

## 2021-07-04 DIAGNOSIS — Z794 Long term (current) use of insulin: Secondary | ICD-10-CM | POA: Diagnosis not present

## 2021-07-04 DIAGNOSIS — Z9981 Dependence on supplemental oxygen: Secondary | ICD-10-CM | POA: Diagnosis not present

## 2021-07-04 DIAGNOSIS — L97512 Non-pressure chronic ulcer of other part of right foot with fat layer exposed: Secondary | ICD-10-CM | POA: Diagnosis not present

## 2021-07-04 DIAGNOSIS — M79605 Pain in left leg: Secondary | ICD-10-CM | POA: Insufficient documentation

## 2021-07-04 DIAGNOSIS — D631 Anemia in chronic kidney disease: Secondary | ICD-10-CM | POA: Diagnosis not present

## 2021-07-04 DIAGNOSIS — M79604 Pain in right leg: Secondary | ICD-10-CM | POA: Insufficient documentation

## 2021-07-04 DIAGNOSIS — E1151 Type 2 diabetes mellitus with diabetic peripheral angiopathy without gangrene: Secondary | ICD-10-CM | POA: Diagnosis not present

## 2021-07-04 DIAGNOSIS — N186 End stage renal disease: Secondary | ICD-10-CM | POA: Diagnosis not present

## 2021-07-04 DIAGNOSIS — G4733 Obstructive sleep apnea (adult) (pediatric): Secondary | ICD-10-CM | POA: Diagnosis not present

## 2021-07-04 DIAGNOSIS — I5032 Chronic diastolic (congestive) heart failure: Secondary | ICD-10-CM | POA: Diagnosis not present

## 2021-07-04 DIAGNOSIS — M79661 Pain in right lower leg: Secondary | ICD-10-CM | POA: Diagnosis not present

## 2021-07-04 DIAGNOSIS — Z992 Dependence on renal dialysis: Secondary | ICD-10-CM | POA: Diagnosis not present

## 2021-07-04 DIAGNOSIS — E11621 Type 2 diabetes mellitus with foot ulcer: Secondary | ICD-10-CM | POA: Diagnosis not present

## 2021-07-04 DIAGNOSIS — E1122 Type 2 diabetes mellitus with diabetic chronic kidney disease: Secondary | ICD-10-CM | POA: Diagnosis not present

## 2021-07-04 DIAGNOSIS — M79662 Pain in left lower leg: Secondary | ICD-10-CM | POA: Diagnosis not present

## 2021-07-04 DIAGNOSIS — I132 Hypertensive heart and chronic kidney disease with heart failure and with stage 5 chronic kidney disease, or end stage renal disease: Secondary | ICD-10-CM | POA: Diagnosis not present

## 2021-07-04 IMAGING — US US EXTREM LOW VENOUS
1 series · 13 of 24 positions shown · non-contrast
Comparison: None Available.

CLINICAL DATA: Bilateral lower extremity pain.



[Series 1: us venous img lower bilat (dvt) · portal-venous · 13 of 56 slices shown]
[im 1/56]
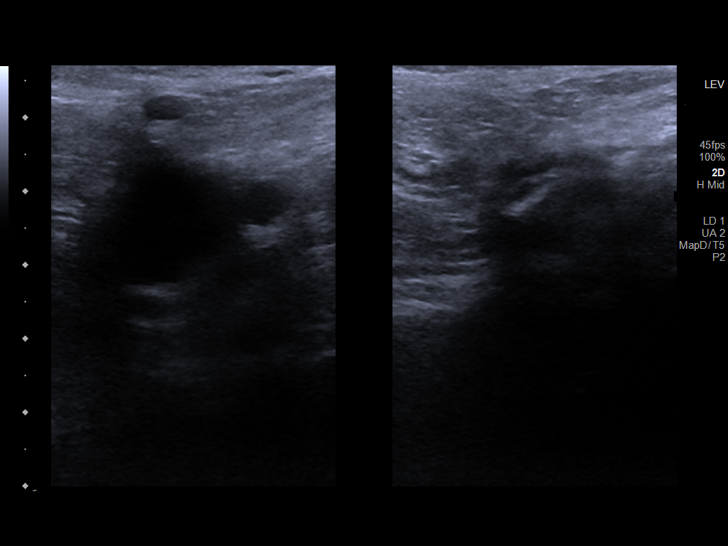
[im 5/56]
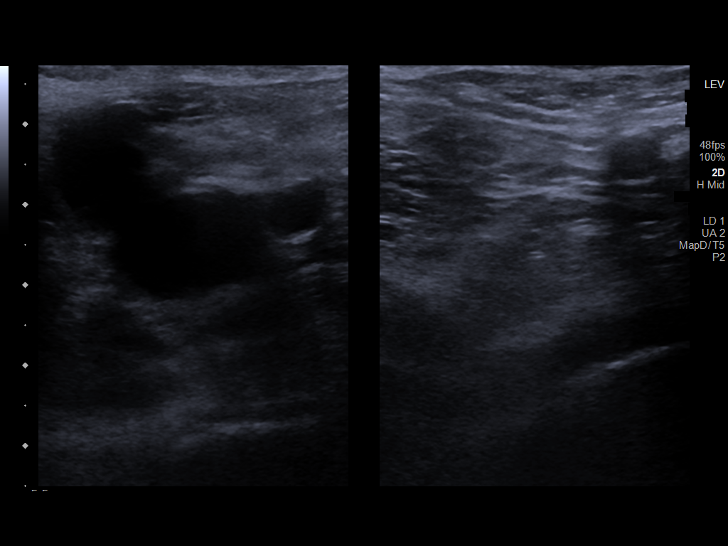
[im 10/56]
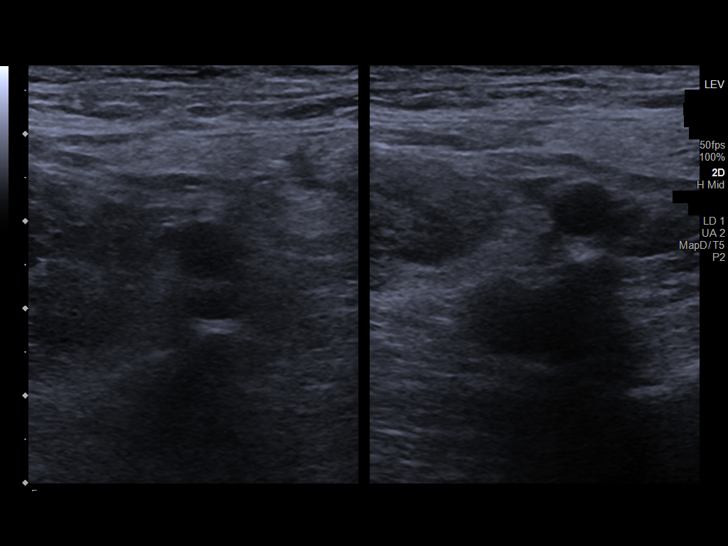
[im 15/56]
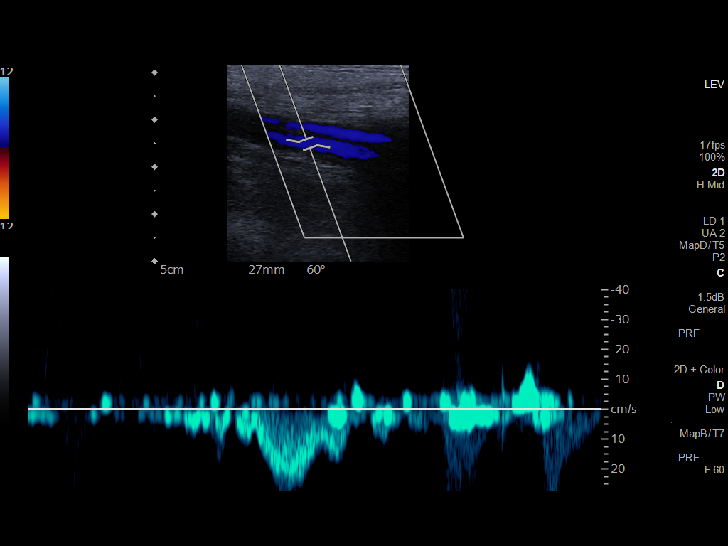
[im 20/56]
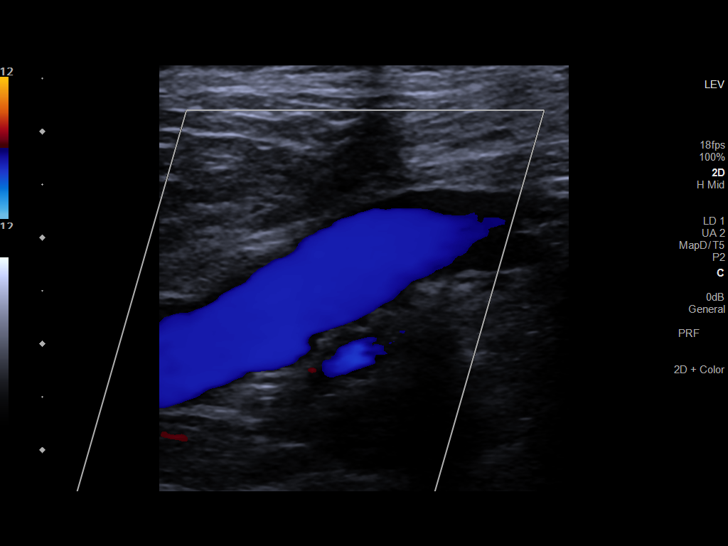
[im 24/56]
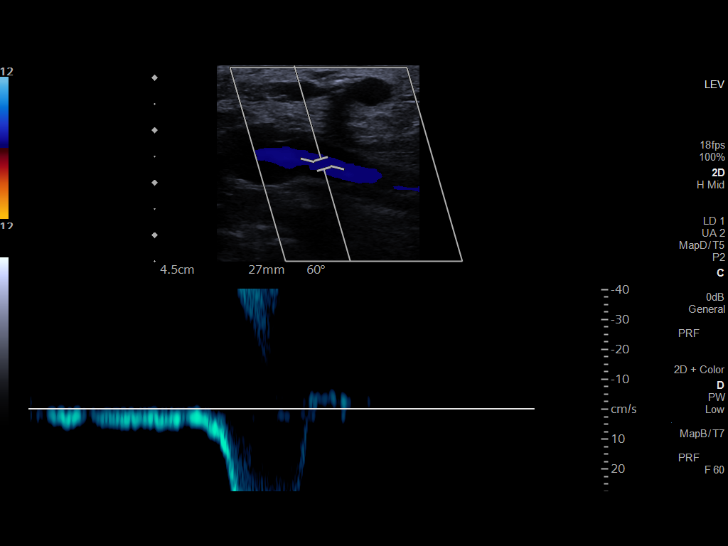
[im 29/56]
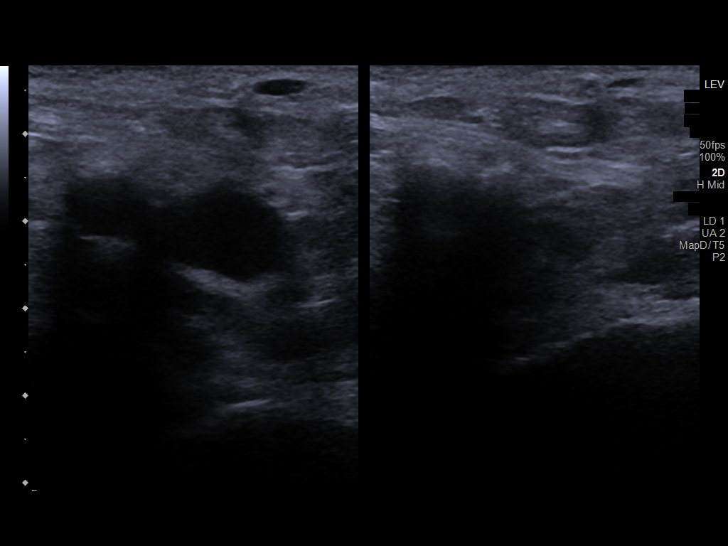
[im 32/56]
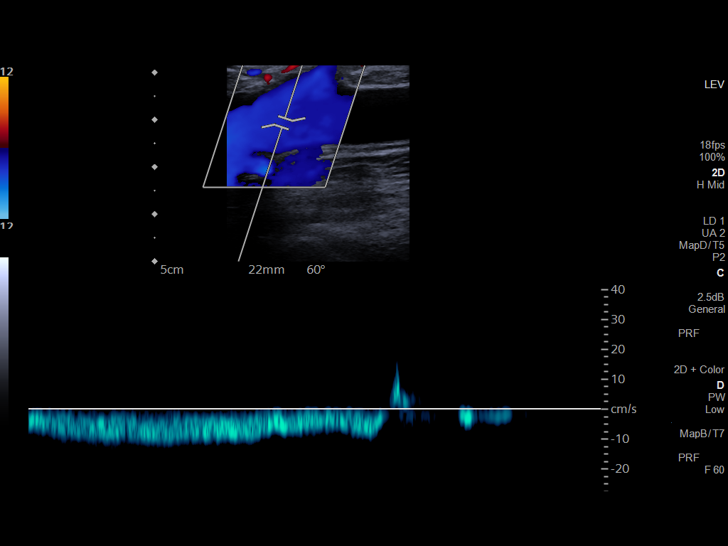
[im 36/56]
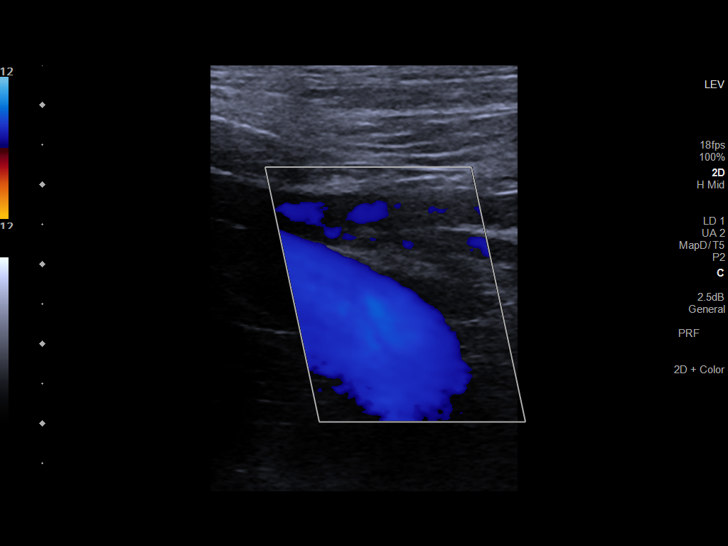
[im 41/56]
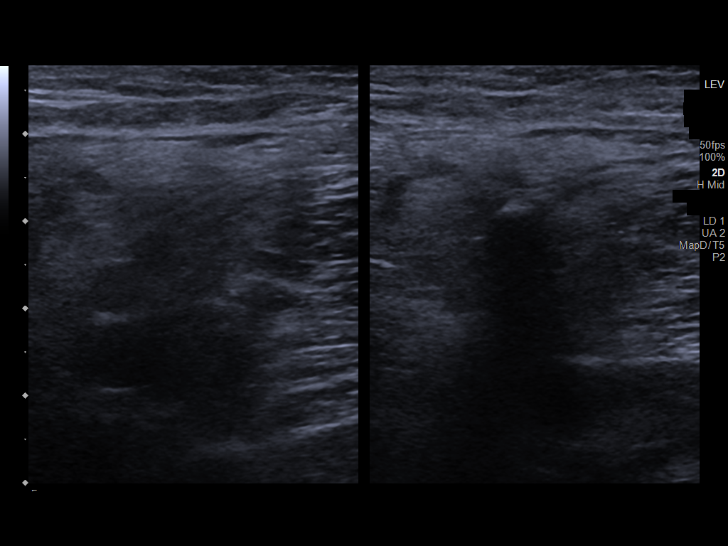
[im 46/56]
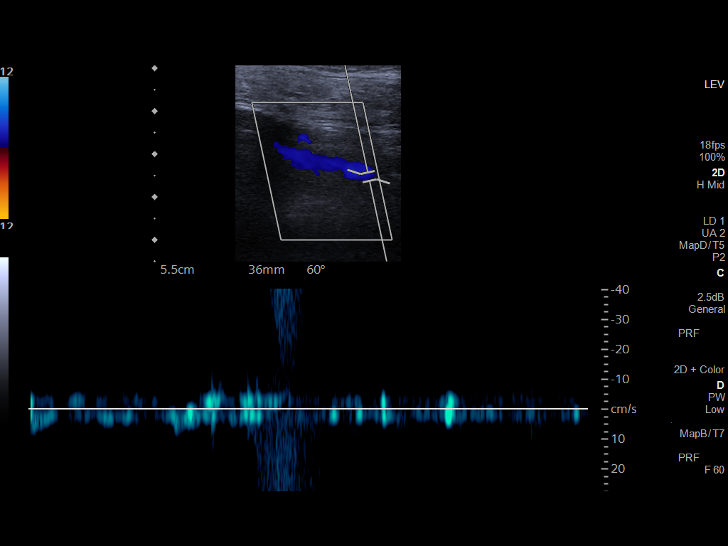
[im 51/56]
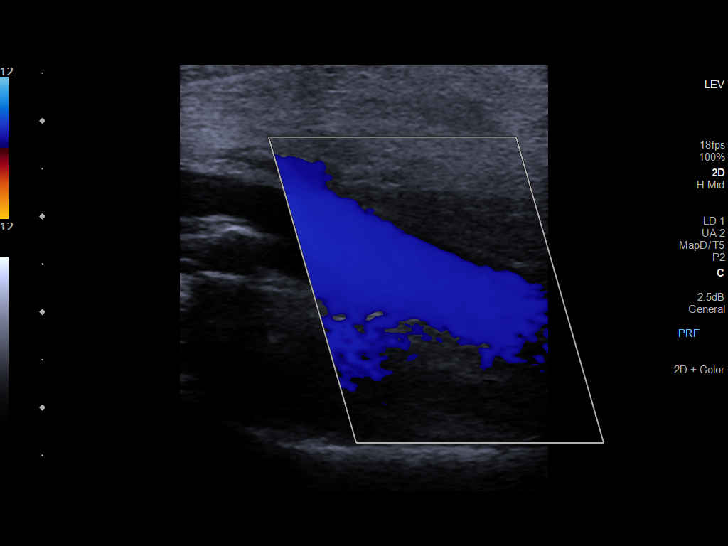
[im 56/56]
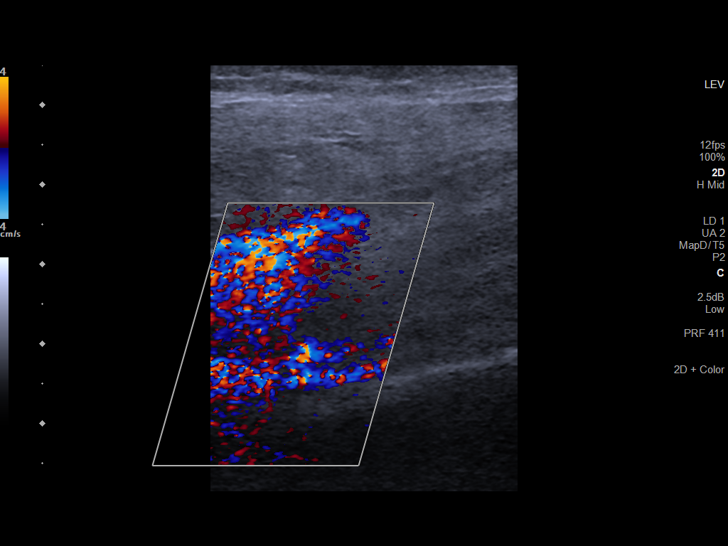

[13 of 24 positions shown; findings below may reference images not displayed]

FINDINGS: RIGHT LOWER EXTREMITY

Common Femoral Vein: No evidence of thrombus. Normal
compressibility, respiratory phasicity and response to augmentation.

Saphenofemoral Junction: No evidence of thrombus. Normal
compressibility and flow on color Doppler imaging.

Profunda Femoral Vein: No evidence of thrombus. Normal
compressibility and flow on color Doppler imaging.

Femoral Vein: No evidence of thrombus. Normal compressibility,
respiratory phasicity and response to augmentation.

Popliteal Vein: No evidence of thrombus. Normal compressibility,
respiratory phasicity and response to augmentation.

Calf Veins: No evidence of thrombus. Normal compressibility and flow
on color Doppler imaging.

Superficial Great Saphenous Vein: No evidence of thrombus. Normal
compressibility.

Venous Reflux:  None.

Other Findings: No evidence of superficial thrombophlebitis or
abnormal fluid collection.

LEFT LOWER EXTREMITY

Common Femoral Vein: No evidence of thrombus. Normal
compressibility, respiratory phasicity and response to augmentation.

Saphenofemoral Junction: No evidence of thrombus. Normal
compressibility and flow on color Doppler imaging.

Profunda Femoral Vein: No evidence of thrombus. Normal
compressibility and flow on color Doppler imaging.

Femoral Vein: No evidence of thrombus. Normal compressibility,
respiratory phasicity and response to augmentation.

Popliteal Vein: No evidence of thrombus. Normal compressibility,
respiratory phasicity and response to augmentation.

Calf Veins: No evidence of thrombus. Normal compressibility and flow
on color Doppler imaging.

Superficial Great Saphenous Vein: No evidence of thrombus. Normal
compressibility.

Venous Reflux:  None.

Other Findings: No evidence of superficial thrombophlebitis or
abnormal fluid collection.
IMPRESSION: No evidence of deep venous thrombosis in either lower extremity.

## 2021-07-05 DIAGNOSIS — N186 End stage renal disease: Secondary | ICD-10-CM | POA: Diagnosis not present

## 2021-07-05 DIAGNOSIS — Z992 Dependence on renal dialysis: Secondary | ICD-10-CM | POA: Diagnosis not present

## 2021-07-06 ENCOUNTER — Encounter: Payer: Medicare Other | Attending: Physician Assistant | Admitting: Physician Assistant

## 2021-07-06 DIAGNOSIS — I5042 Chronic combined systolic (congestive) and diastolic (congestive) heart failure: Secondary | ICD-10-CM | POA: Diagnosis not present

## 2021-07-06 DIAGNOSIS — I132 Hypertensive heart and chronic kidney disease with heart failure and with stage 5 chronic kidney disease, or end stage renal disease: Secondary | ICD-10-CM | POA: Insufficient documentation

## 2021-07-06 DIAGNOSIS — L97515 Non-pressure chronic ulcer of other part of right foot with muscle involvement without evidence of necrosis: Secondary | ICD-10-CM | POA: Diagnosis not present

## 2021-07-06 DIAGNOSIS — I10 Essential (primary) hypertension: Secondary | ICD-10-CM | POA: Diagnosis not present

## 2021-07-06 DIAGNOSIS — E1122 Type 2 diabetes mellitus with diabetic chronic kidney disease: Secondary | ICD-10-CM | POA: Diagnosis not present

## 2021-07-06 DIAGNOSIS — M86371 Chronic multifocal osteomyelitis, right ankle and foot: Secondary | ICD-10-CM | POA: Insufficient documentation

## 2021-07-06 DIAGNOSIS — N186 End stage renal disease: Secondary | ICD-10-CM | POA: Diagnosis not present

## 2021-07-06 DIAGNOSIS — E11621 Type 2 diabetes mellitus with foot ulcer: Secondary | ICD-10-CM | POA: Insufficient documentation

## 2021-07-06 DIAGNOSIS — L97514 Non-pressure chronic ulcer of other part of right foot with necrosis of bone: Secondary | ICD-10-CM | POA: Diagnosis not present

## 2021-07-06 DIAGNOSIS — Z9981 Dependence on supplemental oxygen: Secondary | ICD-10-CM | POA: Diagnosis not present

## 2021-07-06 DIAGNOSIS — Z992 Dependence on renal dialysis: Secondary | ICD-10-CM | POA: Insufficient documentation

## 2021-07-06 NOTE — Progress Notes (Addendum)
Slabaugh, DESIREA MIZRAHI. ([id number]) ?Visit Report for 07/06/2021 ?Chief Complaint Document Details ?Patient Name: Robillard, MEGYN LENG. ?Date of Service: 07/06/2021 2:00 PM ?Medical Record Number: [id number] ?Patient Account Number: [id number] ?Date of Birth/Sex: 1970/10/12 (51 y.o. F) ?Treating RN: Carlene Coria ?Primary Care Provider: Margurite Auerbach Other Clinician: ?Referring Provider: Margurite Auerbach ?Treating Provider/Extender: Jeri Cos ?Weeks in Treatment: 7 ?Information Obtained from: Patient ?Chief Complaint ?Right foot gangrene and osteomyelitis ?Electronic Signature(s) ?Signed: 07/06/2021 2:39:44 PM By: Worthy Keeler PA-C ?Entered By: Worthy Keeler on 07/06/2021 14:39:44 ?Chilcote, KASHLYNN KUNDERT. ([id number]) ?-------------------------------------------------------------------------------- ?HPI Details ?Patient Name: Chism, ALETTE KATAOKA. ?Date of Service: 07/06/2021 2:00 PM ?Medical Record Number: [id number] ?Patient Account Number: [id number] ?Date of Birth/Sex: Sep 28, 1970 (51 y.o. F) ?Treating RN: Carlene Coria ?Primary Care Provider: Margurite Auerbach Other Clinician: ?Referring Provider: Margurite Auerbach ?Treating Provider/Extender: Jeri Cos ?Weeks in Treatment: 7 ?History of Present Illness ?HPI Description: 10/04/2020 upon evaluation today patient presents for initial inspection here in the clinic concerning a fairly concerning wound ?over her right fifth toe towards the medial aspect which actually probes all the way down to bone. There is evidence of necrotic muscle as well and ?I do see tendon exposed as well. Subsequently this does have me very concerned about where things stand here. Again this is the first time that I ?am seeing her and on top of everything else her ABIs are noncompressible. I think that arterial flow is the primary concern here. ?Of note the patient states this was first noted July 1. She had a x-ray on July 4 which was negative for bone infection. She has been on 2 ?antibiotics she is unsure of the  name. With that being said her most recent hemoglobin A1c was 8.1 that was December 2022. She otherwise does ?have a history obviously of diabetes mellitus type 2, hypertension, end-stage renal disease with dependence on renal dialysis, and she is also ?dependent on supplemental oxygen. ?Readmission: ?05/14/2021 this is a patient whom I previously seen October 04, 2020. With that being said she is subsequently on 21 October 2020 ended up ?having an amputation of her fifth digit of her foot unfortunately. This had progressed fairly quickly into what sounds to been gangrene at that ?time. Subsequently right now based on what I am seeing the patient actually has a necrotic area on the end of her foot and has been ?recommended that the only option really here is going to be a amputation which would be a below-knee amputation. She is really not interested in ?that and wants to try to give this every chance it can to heal. For that reason I discussed with her that definitely we can see what we can do to try ?to improve this situation and try to help get this doing better as best we can. With that being said there is definitely a portion of her wound that is ?just not good to be able to heal no matter what it is already starting to auto amputate. This includes potentially digits of her foot as well based on ?what I am seeing. She does have confirmed osteomyelitis noted by MRI. She is also currently on IV vancomycin. She does undergo dialysis on ?Tuesday, Thursday, and Saturday. ?The patient does have a history of diabetes mellitus type 2, congestive heart failure, hypertension, end-stage renal disease, dependence on renal ?dialysis, and dependence on supplemental oxygen. With that being said I am going to need to look into her heart failure and the significance of ?this as far as hyperbarics  is concerned before we initiate treatment. She is probably also going to need an updated EKG and chest x-ray unless its ?already been  done recently. ?05/28/2021 upon evaluation today patient appears to be doing well with regard to her wound all things considered. I am going to try to see what we ?can do about trimmed away some of the necrotic tissue that is really just hanging on here and there and not really helpful. Feel like if we can ?slowly start doing this maybe we will be able to get to the point where this is better and better as far as cleaning up the surface of the wound is ?concerned. Still as I explained I do believe some of her toes are not salvageable but again she is looking more towards autoamputation versus ?want to do anything definitive here from a surgical standpoint. I have been researching whether she would be an okay candidate for hyperbarics ?or not and I found that her x-ray but she had a chest x-ray recently was negative for any acute disease and appear to be stable. EKG was also ?stable. She did also have a cardiac echo which showed that her ejection fraction was 60 to 65% and therefore I think she is a candidate for ?hyperbarics. Again I do believe that this can help with preventing amputation which right now she has been told that her only option would be a ?below-knee amputation. Nonetheless I think that if we can get the heel leading to spread and the majority of this cleared away that it is possible ?we might even be able to get a surgeon just to surgically remove the nonviable toes and not have to go the full way of complete amputation. ?Nonetheless we do have a ways to go before we will get to that point. Either way I think the hyperbarics could be an excellent support as far as ?that is concerned. ?06-05-2021 upon evaluation today patient's wound is showing some signs of improvement. This does seem to be slow and of course it is ?understandable considering what we are seeing. She has discussed and looked up information about hyperbarics and in the end comes back today ?and states that she does want to proceed. I  honestly do feel like that is her best chance at saving the foot. Even if we are able to get her into a ?surgeon after we obtain some good healing approximately so that there is not as much necrotic tissue in general she may even be able to have a ?transmetatarsal amputation and get this to heal. Again I am not saying this is can be an easy road but it may be her best road as far as thinking ?about how to achieve some type of complete healing as I do not believe the toes are viable at all to be honest. ?07-06-2021 upon evaluation today patient appears to be doing well currently in regard to her foot all things considered. Again she unfortunately ?was unable to do the hyperbaric oxygen therapy this was due to her claustrophobia. Nonetheless she tells me that she is pleased with how the ?foot appears and she does feel like there is good improvement going on here. In general I think that we are on the right track. If she is going to ?take a very long time and to be honest the route of autoamputation which is pretty much the way this is going is not a quick or easy road but ?nonetheless she seems to be taking care of her  foot quite well. She is in good spirits. ?Electronic Signature(s) ?Signed: 07/06/2021 4:47:57 PM By: Worthy Keeler PA-C ?Entered By: Worthy Keeler on 07/06/2021 16:47:57 ?Birman, DANYALE RIDINGER. ([id number]) ?-------------------------------------------------------------------------------- ?Physical Exam Details ?Patient Name: Krejci, TOSHIKO KEMLER. ?Date of Service: 07/06/2021 2:00 PM ?Medical Record Number: [id number] ?Patient Account Number: [id number] ?Date of Birth/Sex: [birth year] (51 y.o. F) ?Treating RN: Carlene Coria ?Primary Care Provider: Margurite Auerbach Other Clinician: ?Referring Provider: Margurite Auerbach ?Treating Provider/Extender: Jeri Cos ?Weeks in Treatment: 7 ?Constitutional ?Well-nourished and well-hydrated in no acute distress. ?Respiratory ?normal breathing without  difficulty. ?Psychiatric ?this patient is able to make decisions and demonstrates good insight into disease process. Alert and Oriented x 3. pleasant and cooperative. ?Notes ?Upon inspection patient's wound bed again appear to be fair

## 2021-07-07 DIAGNOSIS — J9610 Chronic respiratory failure, unspecified whether with hypoxia or hypercapnia: Secondary | ICD-10-CM | POA: Diagnosis not present

## 2021-07-07 DIAGNOSIS — N186 End stage renal disease: Secondary | ICD-10-CM | POA: Diagnosis not present

## 2021-07-07 DIAGNOSIS — Z992 Dependence on renal dialysis: Secondary | ICD-10-CM | POA: Diagnosis not present

## 2021-07-07 DIAGNOSIS — L97509 Non-pressure chronic ulcer of other part of unspecified foot with unspecified severity: Secondary | ICD-10-CM | POA: Diagnosis not present

## 2021-07-07 DIAGNOSIS — J449 Chronic obstructive pulmonary disease, unspecified: Secondary | ICD-10-CM | POA: Diagnosis not present

## 2021-07-07 DIAGNOSIS — I5030 Unspecified diastolic (congestive) heart failure: Secondary | ICD-10-CM | POA: Diagnosis not present

## 2021-07-07 DIAGNOSIS — I96 Gangrene, not elsewhere classified: Secondary | ICD-10-CM | POA: Diagnosis not present

## 2021-07-08 DIAGNOSIS — Z992 Dependence on renal dialysis: Secondary | ICD-10-CM | POA: Diagnosis not present

## 2021-07-08 DIAGNOSIS — N186 End stage renal disease: Secondary | ICD-10-CM | POA: Diagnosis not present

## 2021-07-09 ENCOUNTER — Other Ambulatory Visit: Payer: Self-pay | Admitting: Pulmonary Disease

## 2021-07-09 DIAGNOSIS — E1122 Type 2 diabetes mellitus with diabetic chronic kidney disease: Secondary | ICD-10-CM | POA: Diagnosis not present

## 2021-07-09 DIAGNOSIS — I132 Hypertensive heart and chronic kidney disease with heart failure and with stage 5 chronic kidney disease, or end stage renal disease: Secondary | ICD-10-CM | POA: Diagnosis not present

## 2021-07-09 DIAGNOSIS — I5032 Chronic diastolic (congestive) heart failure: Secondary | ICD-10-CM | POA: Diagnosis not present

## 2021-07-09 DIAGNOSIS — Z9981 Dependence on supplemental oxygen: Secondary | ICD-10-CM | POA: Diagnosis not present

## 2021-07-09 DIAGNOSIS — Z794 Long term (current) use of insulin: Secondary | ICD-10-CM | POA: Diagnosis not present

## 2021-07-09 DIAGNOSIS — E11621 Type 2 diabetes mellitus with foot ulcer: Secondary | ICD-10-CM | POA: Diagnosis not present

## 2021-07-09 DIAGNOSIS — Z992 Dependence on renal dialysis: Secondary | ICD-10-CM | POA: Diagnosis not present

## 2021-07-09 DIAGNOSIS — D631 Anemia in chronic kidney disease: Secondary | ICD-10-CM | POA: Diagnosis not present

## 2021-07-09 DIAGNOSIS — L97512 Non-pressure chronic ulcer of other part of right foot with fat layer exposed: Secondary | ICD-10-CM | POA: Diagnosis not present

## 2021-07-09 DIAGNOSIS — N186 End stage renal disease: Secondary | ICD-10-CM | POA: Diagnosis not present

## 2021-07-09 DIAGNOSIS — E1151 Type 2 diabetes mellitus with diabetic peripheral angiopathy without gangrene: Secondary | ICD-10-CM | POA: Diagnosis not present

## 2021-07-10 DIAGNOSIS — Z992 Dependence on renal dialysis: Secondary | ICD-10-CM | POA: Diagnosis not present

## 2021-07-10 DIAGNOSIS — N186 End stage renal disease: Secondary | ICD-10-CM | POA: Diagnosis not present

## 2021-07-10 NOTE — Progress Notes (Signed)
[name], [name] ([id number]) ?Visit Report for 07/06/2021 ?Arrival Information Details ?Patient Name: [name], [name] ?Date of Service: 07/06/2021 2:00 PM ?Medical Record Number: [id number] ?Patient Account Number: [id number] ?Date of Birth/Sex: [birth year] (51 y.o. F) ?Treating RN: Carlene Coria ?Primary Care Kenecia Barren: Margurite Auerbach Other Clinician: ?Referring Sevin Farone: Margurite Auerbach ?Treating Sakira Dahmer/Extender: Jeri Cos ?Weeks in Treatment: 7 ?Visit Information History Since Last Visit ?All ordered tests and consults were completed: No ?Patient Arrived: Ambulatory ?Added or deleted any medications: No ?Arrival Time: 14:27 ?Any new allergies or adverse reactions: No ?Accompanied By: husband ?Had a fall or experienced change in No ?Transfer Assistance: None ?activities of daily living that may affect ?Patient Identification Verified: Yes ?risk of falls: ?Secondary Verification Process Completed: Yes ?Signs or symptoms of abuse/neglect since last visito No ?Patient Requires Transmission-Based Precautions: No ?Hospitalized since last visit: No ?Patient Has Alerts: No ?Implantable device outside of the clinic excluding No ?cellular tissue based products placed in the center ?since last visit: ?Has Dressing in Place as Prescribed: Yes ?Pain Present Now: Yes ?Electronic Signature(s) ?Signed: 07/10/2021 10:35:39 AM By: Carlene Coria RN ?Entered By: Carlene Coria on 07/06/2021 14:31:29 ?[name], [name] ([id number]) ?-------------------------------------------------------------------------------- ?Clinic Level of Care Assessment Details ?Patient Name: [name], [name] ?Date of Service: 07/06/2021 2:00 PM ?Medical Record Number: [id number] ?Patient Account Number: [id number] ?Date of Birth/Sex: Aug 07, 1970 (51 y.o. F) ?Treating RN: Carlene Coria ?Primary Care Destynee Stringfellow: Margurite Auerbach Other Clinician: ?Referring Khloi Rawl: Margurite Auerbach ?Treating Derrik Mceachern/Extender: Jeri Cos ?Weeks in Treatment: 7 ?Clinic Level of Care  Assessment Items ?TOOL 4 Quantity Score ?X - Use when only an EandM is performed on FOLLOW-UP visit 1 0 ?ASSESSMENTS - Nursing Assessment / Reassessment ?X - Reassessment of Co-morbidities (includes updates in patient status) 1 10 ?X- 1 5 ?Reassessment of Adherence to Treatment Plan ?ASSESSMENTS - Wound and Skin Assessment / Reassessment ?X - Simple Wound Assessment / Reassessment - one wound 1 5 ?'[]'$  - 0 ?Complex Wound Assessment / Reassessment - multiple wounds ?'[]'$  - 0 ?Dermatologic / Skin Assessment (not related to wound area) ?ASSESSMENTS - Focused Assessment ?'[]'$  - Circumferential Edema Measurements - multi extremities 0 ?'[]'$  - 0 ?Nutritional Assessment / Counseling / Intervention ?'[]'$  - 0 ?Lower Extremity Assessment (monofilament, tuning fork, pulses) ?'[]'$  - 0 ?Peripheral Arterial Disease Assessment (using hand held doppler) ?ASSESSMENTS - Ostomy and/or Continence Assessment and Care ?'[]'$  - Incontinence Assessment and Management 0 ?'[]'$  - 0 ?Ostomy Care Assessment and Management (repouching, etc.) ?PROCESS - Coordination of Care ?X - Simple Patient / Family Education for ongoing care 1 15 ?'[]'$  - 0 ?Complex (extensive) Patient / Family Education for ongoing care ?'[]'$  - 0 ?Staff obtains Consents, Records, Test Results / Process Orders ?'[]'$  - 0 ?Staff telephones HHA, Nursing Homes / Clarify orders / etc ?'[]'$  - 0 ?Routine Transfer to another Facility (non-emergent condition) ?'[]'$  - 0 ?Routine Hospital Admission (non-emergent condition) ?'[]'$  - 0 ?New Admissions / Biomedical engineer / Ordering NPWT, Apligraf, etc. ?'[]'$  - 0 ?Emergency Hospital Admission (emergent condition) ?X- 1 10 ?Simple Discharge Coordination ?'[]'$  - 0 ?Complex (extensive) Discharge Coordination ?PROCESS - Special Needs ?'[]'$  - Pediatric / Minor Patient Management 0 ?'[]'$  - 0 ?Isolation Patient Management ?'[]'$  - 0 ?Hearing / Language / Visual special needs ?'[]'$  - 0 ?Assessment of Community assistance (transportation, D/C planning, etc.) ?'[]'$  - 0 ?Additional  assistance / Altered mentation ?'[]'$  - 0 ?Support Surface(s) Assessment (bed, cushion, seat, etc.) ?INTERVENTIONS - Wound Cleansing / Measurement ?[name], [name] ([id number]) ?X- 1 5 ?Simple Wound Cleansing -  one wound ?'[]'$  - 0 ?Complex Wound Cleansing - multiple wounds ?X- 1 5 ?Wound Imaging (photographs - any number of wounds) ?'[]'$  - 0 ?Wound Tracing (instead of photographs) ?X- 1 5 ?Simple Wound Measurement - one wound ?'[]'$  - 0 ?Complex Wound Measurement - multiple wounds ?INTERVENTIONS - Wound Dressings ?'[]'$  - Small Wound Dressing one or multiple wounds 0 ?X- 1 15 ?Medium Wound Dressing one or multiple wounds ?'[]'$  - 0 ?Large Wound Dressing one or multiple wounds ?'[]'$  - 0 ?Application of Medications - topical ?'[]'$  - 0 ?Application of Medications - injection ?INTERVENTIONS - Miscellaneous ?'[]'$  - External ear exam 0 ?'[]'$  - 0 ?Specimen Collection (cultures, biopsies, blood, body fluids, etc.) ?'[]'$  - 0 ?Specimen(s) / Culture(s) sent or taken to Lab for analysis ?'[]'$  - 0 ?Patient Transfer (multiple staff / Civil Service fast streamer / Similar devices) ?'[]'$  - 0 ?Simple Staple / Suture removal (25 or less) ?'[]'$  - 0 ?Complex Staple / Suture removal (26 or more) ?'[]'$  - 0 ?Hypo / Hyperglycemic Management (close monitor of Blood Glucose) ?'[]'$  - 0 ?Ankle / Brachial Index (ABI) - do not check if billed separately ?X- 1 5 ?Vital Signs ?Has the patient been seen at the hospital within the last three years: Yes ?Total Score: 80 ?Level Of Care: New/Established - Level ?3 ?Electronic Signature(s) ?Signed: 07/10/2021 10:35:39 AM By: Carlene Coria RN ?Entered By: Carlene Coria on 07/06/2021 15:00:43 ?[name], [name] ([id number]) ?-------------------------------------------------------------------------------- ?Encounter Discharge Information Details ?Patient Name: [name], [name] ?Date of Service: 07/06/2021 2:00 PM ?Medical Record Number: [id number] ?Patient Account Number: [id number] ?Date of Birth/Sex: 1970/12/09 (51 y.o. F) ?Treating RN: Carlene Coria ?Primary Care  Mackie Goon: Margurite Auerbach Other Clinician: ?Referring Churchill Grimsley: Margurite Auerbach ?Treating Halen Antenucci/Extender: Jeri Cos ?Weeks in Treatment: 7 ?Encounter Discharge Information Items ?Discharge Condition: Stable ?Ambulatory Status: Other ?Discharge Destination: Home ?Transportation: Private Auto ?Accompanied By: husband ?Schedule Follow-up Appointment: Yes ?Clinical Summary of Care: Patient Declined ?Electronic Signature(s) ?Signed: 07/10/2021 10:35:39 AM By: Carlene Coria RN ?Entered By: Carlene Coria on 07/06/2021 15:14:55 ?[name], [name] ([id number]) ?-------------------------------------------------------------------------------- ?Lower Extremity Assessment Details ?Patient Name: [name], [name] ?Date of Service: 07/06/2021 2:00 PM ?Medical Record Number: [id number] ?Patient Account Number: [id number] ?Date of Birth/Sex: 1970-11-12 (51 y.o. F) ?Treating RN: Carlene Coria ?Primary Care Nariya Neumeyer: Margurite Auerbach Other Clinician: ?Referring Lennie Dunnigan: Margurite Auerbach ?Treating Neeta Storey/Extender: Jeri Cos ?Weeks in Treatment: 7 ?Edema Assessment ?Assessed: [Left: No] [Right: No] ?Edema: [Left: Ye] [Right: s] ?Calf ?Left: Right: ?Point of Measurement: 32 cm From Medial Instep 37.2 cm ?Ankle ?Left: Right: ?Point of Measurement: 10 cm From Medial Instep 23 cm ?Vascular Assessment ?Pulses: ?Dorsalis Pedis ?Palpable: [Right:No] ?Notes ?UNABLE TO LOCATE ?Electronic Signature(s) ?Signed: 07/10/2021 10:35:39 AM By: Carlene Coria RN ?Entered By: Carlene Coria on 07/06/2021 14:39:13 ?[name], ALIZEY NOREN. ([id number]) ?-------------------------------------------------------------------------------- ?Multi Wound Chart Details ?Patient Name: Osinski, ALMEDIA CORDELL. ?Date of Service: 07/06/2021 2:00 PM ?Medical Record Number: [id number] ?Patient Account Number: [id number] ?Date of Birth/Sex: 11/23/1970 (51 y.o. F) ?Treating RN: Carlene Coria ?Primary Care Zohal Reny: Margurite Auerbach Other Clinician: ?Referring Lalisa Kiehn: Margurite Auerbach ?Treating  Tonnya Garbett/Extender: Jeri Cos ?Weeks in Treatment: 7 ?Photos: [N/A:N/A] ?Wound Location: Right, Circumferential Foot N/A N/A ?Wounding Event: Gradually Appeared N/A N/A ?Primary Etiology: Diabetic Wound/Ulcer

## 2021-07-12 DIAGNOSIS — J449 Chronic obstructive pulmonary disease, unspecified: Secondary | ICD-10-CM | POA: Diagnosis not present

## 2021-07-12 DIAGNOSIS — N186 End stage renal disease: Secondary | ICD-10-CM | POA: Diagnosis not present

## 2021-07-12 DIAGNOSIS — J9610 Chronic respiratory failure, unspecified whether with hypoxia or hypercapnia: Secondary | ICD-10-CM | POA: Diagnosis not present

## 2021-07-12 DIAGNOSIS — Z992 Dependence on renal dialysis: Secondary | ICD-10-CM | POA: Diagnosis not present

## 2021-07-13 DIAGNOSIS — J449 Chronic obstructive pulmonary disease, unspecified: Secondary | ICD-10-CM | POA: Diagnosis not present

## 2021-07-14 DIAGNOSIS — N186 End stage renal disease: Secondary | ICD-10-CM | POA: Diagnosis not present

## 2021-07-14 DIAGNOSIS — Z992 Dependence on renal dialysis: Secondary | ICD-10-CM | POA: Diagnosis not present

## 2021-07-17 DIAGNOSIS — N186 End stage renal disease: Secondary | ICD-10-CM | POA: Diagnosis not present

## 2021-07-17 DIAGNOSIS — Z992 Dependence on renal dialysis: Secondary | ICD-10-CM | POA: Diagnosis not present

## 2021-07-18 DIAGNOSIS — I5032 Chronic diastolic (congestive) heart failure: Secondary | ICD-10-CM | POA: Diagnosis not present

## 2021-07-18 DIAGNOSIS — J9611 Chronic respiratory failure with hypoxia: Secondary | ICD-10-CM | POA: Diagnosis not present

## 2021-07-18 DIAGNOSIS — J449 Chronic obstructive pulmonary disease, unspecified: Secondary | ICD-10-CM | POA: Diagnosis not present

## 2021-07-19 DIAGNOSIS — Z992 Dependence on renal dialysis: Secondary | ICD-10-CM | POA: Diagnosis not present

## 2021-07-19 DIAGNOSIS — N186 End stage renal disease: Secondary | ICD-10-CM | POA: Diagnosis not present

## 2021-07-21 DIAGNOSIS — Z992 Dependence on renal dialysis: Secondary | ICD-10-CM | POA: Diagnosis not present

## 2021-07-21 DIAGNOSIS — N186 End stage renal disease: Secondary | ICD-10-CM | POA: Diagnosis not present

## 2021-07-24 DIAGNOSIS — N186 End stage renal disease: Secondary | ICD-10-CM | POA: Diagnosis not present

## 2021-07-24 DIAGNOSIS — Z992 Dependence on renal dialysis: Secondary | ICD-10-CM | POA: Diagnosis not present

## 2021-07-25 DIAGNOSIS — Z992 Dependence on renal dialysis: Secondary | ICD-10-CM | POA: Diagnosis not present

## 2021-07-25 DIAGNOSIS — N186 End stage renal disease: Secondary | ICD-10-CM | POA: Diagnosis not present

## 2021-07-26 DIAGNOSIS — I5040 Unspecified combined systolic (congestive) and diastolic (congestive) heart failure: Secondary | ICD-10-CM | POA: Diagnosis not present

## 2021-07-26 DIAGNOSIS — J45909 Unspecified asthma, uncomplicated: Secondary | ICD-10-CM | POA: Diagnosis not present

## 2021-07-26 DIAGNOSIS — Z992 Dependence on renal dialysis: Secondary | ICD-10-CM | POA: Diagnosis not present

## 2021-07-26 DIAGNOSIS — N186 End stage renal disease: Secondary | ICD-10-CM | POA: Diagnosis not present

## 2021-07-27 ENCOUNTER — Encounter: Payer: Medicare Other | Attending: Physician Assistant | Admitting: Physician Assistant

## 2021-07-27 DIAGNOSIS — E11621 Type 2 diabetes mellitus with foot ulcer: Secondary | ICD-10-CM | POA: Diagnosis not present

## 2021-07-27 DIAGNOSIS — Z992 Dependence on renal dialysis: Secondary | ICD-10-CM | POA: Insufficient documentation

## 2021-07-27 DIAGNOSIS — N186 End stage renal disease: Secondary | ICD-10-CM | POA: Diagnosis not present

## 2021-07-27 DIAGNOSIS — E1122 Type 2 diabetes mellitus with diabetic chronic kidney disease: Secondary | ICD-10-CM | POA: Diagnosis not present

## 2021-07-27 DIAGNOSIS — L97512 Non-pressure chronic ulcer of other part of right foot with fat layer exposed: Secondary | ICD-10-CM | POA: Diagnosis not present

## 2021-07-27 DIAGNOSIS — L97514 Non-pressure chronic ulcer of other part of right foot with necrosis of bone: Secondary | ICD-10-CM | POA: Insufficient documentation

## 2021-07-27 DIAGNOSIS — M86371 Chronic multifocal osteomyelitis, right ankle and foot: Secondary | ICD-10-CM | POA: Insufficient documentation

## 2021-07-27 DIAGNOSIS — Z9981 Dependence on supplemental oxygen: Secondary | ICD-10-CM | POA: Insufficient documentation

## 2021-07-27 DIAGNOSIS — I509 Heart failure, unspecified: Secondary | ICD-10-CM | POA: Diagnosis not present

## 2021-07-27 DIAGNOSIS — I132 Hypertensive heart and chronic kidney disease with heart failure and with stage 5 chronic kidney disease, or end stage renal disease: Secondary | ICD-10-CM | POA: Insufficient documentation

## 2021-07-27 NOTE — Progress Notes (Addendum)
[name], [name] ([id number]) Visit Report for 07/27/2021 Chief Complaint Document Details Patient Name: [name] [name] WOLTER. Date of Service: 07/27/2021 11:15 AM Medical Record Number: [id number] Patient Account Number: [id number] Date of Birth/Sex: [birth year] (51 y.o. F) Treating RN: Cornell Barman Primary Care Provider: Margurite Auerbach Other Clinician: Referring Provider: Margurite Auerbach Treating Provider/Extender: Jeri Cos Weeks in Treatment: 10 Information Obtained from: Patient Chief Complaint Right foot gangrene and osteomyelitis Electronic Signature(s) Signed: 07/27/2021 4:28:01 PM By: Alycia Rossetti Signed: 07/27/2021 4:36:57 PM By: Worthy Keeler PA-C Previous Signature: 07/27/2021 11:14:12 AM Version By: Worthy Keeler PA-C Entered By: Alycia Rossetti on 07/27/2021 12:50:00 [name] [name] [name] ([id number]) -------------------------------------------------------------------------------- Debridement Details Patient Name: [name] [name]. Date of Service: 07/27/2021 11:15 AM Medical Record Number: [id number] Patient Account Number: [id number] Date of Birth/Sex: 1970/06/14 (51 y.o. F) Treating RN: Alycia Rossetti Primary Care Provider: Margurite Auerbach Other Clinician: Referring Provider: Margurite Auerbach Treating Provider/Extender: Jeri Cos Weeks in Treatment: 10 Debridement Performed for Wound #2 Right,Circumferential Foot Assessment: Performed By: Physician Tommie Sams., PA-C Debridement Type: Debridement Severity of Tissue Pre Debridement: Fat layer exposed Level of Consciousness (Pre- Awake and Alert procedure): Pre-procedure Verification/Time Out Yes - 12:20 Taken: Start Time: 12:20 Pain Control: Lidocaine 4% Topical Solution Total Area Debrided (L x W): 8 (cm) x 4 (cm) = 32 (cm) Tissue and other material Viable, Non-Viable, Eschar, Subcutaneous debrided: Level: Skin/Subcutaneous Tissue Debridement Description: Excisional Instrument: Curette, Forceps, Scissors Bleeding:  Minimum Hemostasis Achieved: Pressure End Time: 12:23 Procedural Pain: 0 Post Procedural Pain: 0 Response to Treatment: Procedure was tolerated well Level of Consciousness (Post- Awake and Alert procedure): Post Debridement Measurements of Total Wound Length: (cm) 16.2 Width: (cm) 8.5 Depth: (cm) 0.9 Volume: (cm) 97.334 Character of Wound/Ulcer Post Debridement: Stable Severity of Tissue Post Debridement: Fat layer exposed Post Procedure Diagnosis Same as Pre-procedure Electronic Signature(s) Signed: 07/27/2021 4:28:01 PM By: Alycia Rossetti Signed: 07/27/2021 4:36:57 PM By: Worthy Keeler PA-C Entered By: Alycia Rossetti on 07/27/2021 12:30:35 [name] [name] [name] ([id number]) -------------------------------------------------------------------------------- HPI Details Patient Name: [name] [name]. Date of Service: 07/27/2021 11:15 AM Medical Record Number: [id number] Patient Account Number: [id number] Date of Birth/Sex: April 06, 1970 (51 y.o. F) Treating RN: Cornell Barman Primary Care Provider: Margurite Auerbach Other Clinician: Referring Provider: Margurite Auerbach Treating Provider/Extender: Jeri Cos Weeks in Treatment: 10 History of Present Illness HPI Description: 10/04/2020 upon evaluation today patient presents for initial inspection here in the clinic concerning a fairly concerning wound over her right fifth toe towards the medial aspect which actually probes all the way down to bone. There is evidence of necrotic muscle as well and I do see tendon exposed as well. Subsequently this does have me very concerned about where things stand here. Again this is the first time that I am seeing her and on top of everything else her ABIs are noncompressible. I think that arterial flow is the primary concern here. Of note the patient states this was first noted July 1. She had a x-ray on July 4 which was negative for bone infection. She has been on 2 antibiotics she is unsure of the name. With that  being said her most recent hemoglobin A1c was 8.1 that was December 2022. She otherwise does have a history obviously of diabetes mellitus type 2, hypertension, end-stage renal disease with dependence on renal dialysis, and she is also dependent on supplemental oxygen. Readmission: 05/14/2021 this is a patient whom I previously seen October 04, 2020. With that being said she is subsequently on  21 October 2020 ended up having an amputation of her fifth digit of her foot unfortunately. This had progressed fairly quickly into what sounds to been gangrene at that time. Subsequently right now based on what I am seeing the patient actually has a necrotic area on the end of her foot and has been recommended that the only option really here is going to be a amputation which would be a below-knee amputation. She is really not interested in that and wants to try to give this every chance it can to heal. For that reason I discussed with her that definitely we can see what we can do to try to improve this situation and try to help get this doing better as best we can. With that being said there is definitely a portion of her wound that is just not good to be able to heal no matter what it is already starting to auto amputate. This includes potentially digits of her foot as well based on what I am seeing. She does have confirmed osteomyelitis noted by MRI. She is also currently on IV vancomycin. She does undergo dialysis on Tuesday, Thursday, and Saturday. The patient does have a history of diabetes mellitus type 2, congestive heart failure, hypertension, end-stage renal disease, dependence on renal dialysis, and dependence on supplemental oxygen. With that being said I am going to need to look into her heart failure and the significance of this as far as hyperbarics is concerned before we initiate treatment. She is probably also going to need an updated EKG and chest x-ray unless its already been done  recently. 05/28/2021 upon evaluation today patient appears to be doing well with regard to her wound all things considered. I am going to try to see what we can do about trimmed away some of the necrotic tissue that is really just hanging on here and there and not really helpful. Feel like if we can slowly start doing this maybe we will be able to get to the point where this is better and better as far as cleaning up the surface of the wound is concerned. Still as I explained I do believe some of her toes are not salvageable but again she is looking more towards autoamputation versus want to do anything definitive here from a surgical standpoint. I have been researching whether she would be an okay candidate for hyperbarics or not and I found that her x-ray but she had a chest x-ray recently was negative for any acute disease and appear to be stable. EKG was also stable. She did also have a cardiac echo which showed that her ejection fraction was 60 to 65% and therefore I think she is a candidate for hyperbarics. Again I do believe that this can help with preventing amputation which right now she has been told that her only option would be a below-knee amputation. Nonetheless I think that if we can get the heel leading to spread and the majority of this cleared away that it is possible we might even be able to get a surgeon just to surgically remove the nonviable toes and not have to go the full way of complete amputation. Nonetheless we do have a ways to go before we will get to that point. Either way I think the hyperbarics could be an excellent support as far as that is concerned. 06-05-2021 upon evaluation today patient's wound is showing some signs of improvement. This does seem to be slow and of course it is understandable  considering what we are seeing. She has discussed and looked up information about hyperbarics and in the end comes back today and states that she does want to proceed. I honestly  do feel like that is her best chance at saving the foot. Even if we are able to get her into a surgeon after we obtain some good healing approximately so that there is not as much necrotic tissue in general she may even be able to have a transmetatarsal amputation and get this to heal. Again I am not saying this is can be an easy road but it may be her best road as far as thinking about how to achieve some type of complete healing as I do not believe the toes are viable at all to be honest. 07-06-2021 upon evaluation today patient appears to be doing well currently in regard to her foot all things considered. Again she unfortunately was unable to do the hyperbaric oxygen therapy this was due to her claustrophobia. Nonetheless she tells me that she is pleased with how the foot appears and she does feel like there is good improvement going on here. In general I think that we are on the right track. If she is going to take a very long time and to be honest the route of autoamputation which is pretty much the way this is going is not a quick or easy road but nonetheless she seems to be taking care of her foot quite well. She is in good spirits. 07-27-2021 upon evaluation today patient appears to be doing well currently in regard to her foot all things considered. A lot of the eschar is starting to lift up around the edges which is good there is still significant necrotic tissue on the distal portion of the foot. This is slowly going to work its way off. Based on what I am seeing currently I do not believe that there is any evidence of infection which is good news. Electronic Signature(s) Signed: 07/27/2021 1:19:22 PM By: Worthy Keeler PA-C Entered By: Worthy Keeler on 07/27/2021 13:19:22 Settles, [name] [name] ([id number]) -------------------------------------------------------------------------------- Physical Exam Details Patient Name: Vecchiarelli, [name] [name]. Date of Service: 07/27/2021 11:15 AM Medical Record  Number: [id number] Patient Account Number: [id number] Date of Birth/Sex: 05-Jan-1971 (51 y.o. F) Treating RN: Alycia Rossetti Primary Care Provider: Margurite Auerbach Other Clinician: Referring Provider: Margurite Auerbach Treating Provider/Extender: Jeri Cos Weeks in Treatment: 67 Constitutional Well-nourished and well-hydrated in no acute distress. Respiratory normal breathing without difficulty. Psychiatric this patient is able to make decisions and demonstrates good insight into disease process. Alert and Oriented x 3. pleasant and cooperative. Notes Upon inspection patient's wound bed actually showed signs of good granulation and epithelization at this point. Fortunately I do not see any evidence of active infection locally or systemically which is great news and overall I think that we are on the right track. I did perform some debridement clearing some of the eschar around the edges of the wound. Again little by little while getting the self which hopefully will allow this to continue to heal much more effectively as we go. The entire wound was not debrided therefore the debridement size is smaller than the actual wound size. Electronic Signature(s) Signed: 07/27/2021 1:19:50 PM By: Worthy Keeler PA-C Entered By: Worthy Keeler on 07/27/2021 13:19:49 [name] [name] [name] ([id number]) -------------------------------------------------------------------------------- Physician Orders Details Patient Name: Kofman, [name] [name]. Date of Service: 07/27/2021 11:15 AM Medical Record Number: [id number] Patient Account Number: [id number] Date of Birth/Sex:  1970-04-09 (51 y.o. F) Treating RN: Alycia Rossetti Primary Care Provider: Margurite Auerbach Other Clinician: Referring Provider: Margurite Auerbach Treating Provider/Extender: Jeri Cos Weeks in Treatment: 10 Verbal / Phone Orders: No Diagnosis Coding ICD-10 Coding Code Description M86.371 Chronic multifocal osteomyelitis, right ankle and foot E11.621  Type 2 diabetes mellitus with foot ulcer L97.514 Non-pressure chronic ulcer of other part of right foot with necrosis of bone I50.42 Chronic combined systolic (congestive) and diastolic (congestive) heart failure I10 Essential (primary) hypertension N18.6 End stage renal disease Z99.2 Dependence on renal dialysis Z99.81 Dependence on supplemental oxygen Follow-up Appointments o Return Appointment in 3 weeks. o Nurse Visit as needed [facility] for wound care. May utilize formulary equivalent dressing for wound treatment orders unless otherwise specified. Home Health Nurse may visit [facility] to address patientos wound care needs. o Scheduled days for dressing changes to be completed; exception, patient has scheduled wound care visit that day. o **Please direct any NON-WOUND related issues/requests for orders to patient's Primary Care Physician. **If current dressing causes regression in wound condition, may D/C ordered dressing product/s and apply Normal Saline Moist Dressing daily until next [place] or Other MD appointment. **Notify [facility] of regression in wound condition at [phone]. Bathing/ Shower/ Hygiene o Clean wound with Normal Saline or wound cleanser. o Wash wounds with antibacterial soap and water. o May shower; gently cleanse wound with antibacterial soap, rinse and pat dry prior to dressing wounds o No tub bath. Off-Loading o Open toe surgical shoe - provided at visit Hyperbaric Oxygen Therapy o Evaluate for HBO Therapy o Indication and location: - right foot o If appropriate for treatment, begin HBOT per protocol: o 2.5 ATA for 90 Minutes with 2 Five (5) Minute Air Breaks o One treatment per day (delivered Monday through Friday unless otherwise specified in Special Instructions below): o Total # of Treatments: - 40 o Finger stick Blood Glucose Pre- and  Post- HBOT Treatment. o Follow Hyperbaric Oxygen Glycemia Protocol Wound Treatment Wound #2 - Foot Wound Laterality: Right, Circumferential Cleanser: Normal Saline 1 x Per Day/30 Days Discharge Instructions: Wash your hands with soap and water. Remove old dressing, discard into plastic bag and place into trash. Cleanse the wound with Normal Saline prior to applying a clean dressing using gauze sponges, not tissues or cotton balls. Do not scrub or use excessive force. Pat dry using gauze sponges, not tissue or cotton balls. Cleanser: Soap and Water 1 x Per Day/30 Days Discharge Instructions: Gently cleanse wound with antibacterial soap, rinse and pat dry prior to dressing wounds Topical: Betadine 1 x Per Day/30 Days Martis, [name] [name] ([id number]) Discharge Instructions: Apply betadine as directed. apply to gauze and pack sites and lay op open areas to top of foot Primary Dressing: Gauze 1 x Per Day/30 Days Discharge Instructions: As directed: betadine applied Secondary Dressing: ABD Pad 5x9 (in/in) 1 x Per Day/30 Days Discharge Instructions: Cover with ABD pad Secured With: Medipore Tape - 8M Medipore H Soft Cloth Surgical Tape, 2x2 (in/yd) 1 x Per Day/30 Days Secured With: Kerlix Roll Sterile or Non-Sterile 6-ply 4.5x4 (yd/yd) 1 x Per Day/30 Days Discharge Instructions: Apply Kerlix as directed Secured With: [employer] Dressing, Latex-free, Size 5, Small-Head / Shoulder / Thigh 1 x Per Day/30 Days GLYCEMIA INTERVENTIONS PROTOCOL PRE-HBO GLYCEMIA INTERVENTIONS ACTION INTERVENTION Obtain pre-HBO capillary blood glucose (ensure 1 physician order is in chart). A. Notify HBO physician and await physician orders. 2  If result is 70 mg/dl or below: B. If the result meets the hospital definition of a critical result, follow hospital policy. A. Give patient an 8 ounce Glucerna Shake, an 8 ounce Ensure, or 8 ounces of a Glucerna/Ensure equivalent dietary supplement*. B. Wait 30  minutes. If result is 71 mg/dl to 130 mg/dl: C. Retest patientos capillary blood glucose (CBG). D. If result greater than or equal to 110 mg/dl, proceed with HBO. If result less than 110 mg/dl, notify HBO physician and consider holding HBO. If result is 131 mg/dl to 249 mg/dl: A. Proceed with HBO. A. Notify HBO physician and await physician orders. B. It is recommended to hold HBO and do If result is 250 mg/dl or greater: blood/urine ketone testing. C. If the result meets the hospital definition of a critical result, follow hospital policy. POST-HBO GLYCEMIA INTERVENTIONS ACTION INTERVENTION Obtain post HBO capillary blood glucose (ensure 1 physician order is in chart). A. Notify HBO physician and await physician orders. 2 If result is 70 mg/dl or below: B. If the result meets the hospital definition of a critical result, follow hospital policy. A. Give patient an 8 ounce Glucerna Shake, an 8 ounce Ensure, or 8 ounces of a Glucerna/Ensure equivalent dietary supplement*. B. Wait 15 minutes for symptoms of hypoglycemia (i.e. nervousness, anxiety, If result is 71 mg/dl to 100 mg/dl: sweating, chills, clamminess, irritability, confusion, tachycardia or dizziness). C. If patient asymptomatic, discharge patient. If patient symptomatic, repeat capillary blood glucose (CBG) and notify HBO physician. If result is 101 mg/dl to 249 mg/dl: A. Discharge patient. A. Notify HBO physician and await physician orders. B. It is recommended to do blood/urine If result is 250 mg/dl or greater: ketone testing. C. If the result meets the hospital definition of a critical result, follow hospital policy. *Juice or candies are NOT equivalent products. If patient refuses the Glucerna or Ensure, please consult the hospital dietitian for an appropriate substitute. [name], ZURRI RUDDEN ([id number]) Electronic Signature(s) Signed: 07/27/2021 4:28:01 PM By: Alycia Rossetti Signed: 07/27/2021 4:36:57 PM  By: Worthy Keeler PA-C Entered By: Alycia Rossetti on 07/27/2021 12:45:41 [name] [name] [name] ([id number]) -------------------------------------------------------------------------------- Problem List Details Patient Name: [name] [name] [name]. Date of Service: 07/27/2021 11:15 AM Medical Record Number: [id number] Patient Account Number: [id number] Date of Birth/Sex: 04/03/1970 (51 y.o. F) Treating RN: Cornell Barman Primary Care Provider: Margurite Auerbach Other Clinician: Referring Provider: Margurite Auerbach Treating Provider/Extender: Jeri Cos Weeks in Treatment: 10 Active Problems ICD-10 Encounter Code Description Active Date MDM Diagnosis M86.371 Chronic multifocal osteomyelitis, right ankle and foot 05/18/2021 No Yes E11.621 Type 2 diabetes mellitus with foot ulcer 05/18/2021 No Yes L97.514 Non-pressure chronic ulcer of other part of right foot with necrosis of 05/18/2021 No Yes bone I50.42 Chronic combined systolic (congestive) and diastolic (congestive) heart 05/18/2021 No Yes failure I10 Essential (primary) hypertension 05/18/2021 No Yes N18.6 End stage renal disease 05/18/2021 No Yes Z99.2 Dependence on renal dialysis 05/18/2021 No Yes Z99.81 Dependence on supplemental oxygen 05/18/2021 No Yes Inactive Problems Resolved Problems Electronic Signature(s) Signed: 07/27/2021 4:28:01 PM By: Alycia Rossetti Signed: 07/27/2021 4:36:57 PM By: Worthy Keeler PA-C Previous Signature: 07/27/2021 11:14:03 AM Version By: Worthy Keeler PA-C Entered By: Alycia Rossetti on 07/27/2021 12:49:53 [name] [name] [name] ([id number]) -------------------------------------------------------------------------------- Progress Note Details Patient Name: [name] [name] [name]. Date of Service: 07/27/2021 11:15 AM Medical Record Number: [id number] Patient Account Number: [id number] Date of Birth/Sex: 1970/07/06 (51 y.o. F) Treating RN: Cornell Barman Primary Care Provider: Margurite Auerbach Other Clinician: Referring Provider: Charlynn Grimes,  Chelsea Treating Provider/Extender: Jeri Cos Weeks in Treatment: 10 Subjective Chief Complaint Information obtained from Patient Right foot gangrene and osteomyelitis History of Present Illness (HPI) 10/04/2020 upon evaluation today patient presents for initial inspection here in the clinic concerning a fairly concerning wound over her right fifth toe towards the medial aspect which actually probes all the way down to bone. There is evidence of necrotic muscle as well and I do see tendon exposed as well. Subsequently this does have me very concerned about where things stand here. Again this is the first time that I am seeing her and on top of everything else her ABIs are noncompressible. I think that arterial flow is the primary concern here. Of note the patient states this was first noted July 1. She had a x-ray on July 4 which was negative for bone infection. She has been on 2 antibiotics she is unsure of the name. With that being said her most recent hemoglobin A1c was 8.1 that was December 2022. She otherwise does have a history obviously of diabetes mellitus type 2, hypertension, end-stage renal disease with dependence on renal dialysis, and she is also dependent on supplemental oxygen. Readmission: 05/14/2021 this is a patient whom I previously seen October 04, 2020. With that being said she is subsequently on 21 October 2020 ended up having an amputation of her fifth digit of her foot unfortunately. This had progressed fairly quickly into what sounds to been gangrene at that time. Subsequently right now based on what I am seeing the patient actually has a necrotic area on the end of her foot and has been recommended that the only option really here is going to be a amputation which would be a below-knee amputation. She is really not interested in that and wants to try to give this every chance it can to heal. For that reason I discussed with her that definitely we can see what we can do to  try to improve this situation and try to help get this doing better as best we can. With that being said there is definitely a portion of her wound that is just not good to be able to heal no matter what it is already starting to auto amputate. This includes potentially digits of her foot as well based on what I am seeing. She does have confirmed osteomyelitis noted by MRI. She is also currently on IV vancomycin. She does undergo dialysis on Tuesday, Thursday, and Saturday. The patient does have a history of diabetes mellitus type 2, congestive heart failure, hypertension, end-stage renal disease, dependence on renal dialysis, and dependence on supplemental oxygen. With that being said I am going to need to look into her heart failure and the significance of this as far as hyperbarics is concerned before we initiate treatment. She is probably also going to need an updated EKG and chest x-ray unless its already been done recently. 05/28/2021 upon evaluation today patient appears to be doing well with regard to her wound all things considered. I am going to try to see what we can do about trimmed away some of the necrotic tissue that is really just hanging on here and there and not really helpful. Feel like if we can slowly start doing this maybe we will be able to get to the point where this is better and better as far as cleaning up the surface of the wound is concerned. Still as I explained I do believe some of her toes are not salvageable but  again she is looking more towards autoamputation versus want to do anything definitive here from a surgical standpoint. I have been researching whether she would be an okay candidate for hyperbarics or not and I found that her x-ray but she had a chest x-ray recently was negative for any acute disease and appear to be stable. EKG was also stable. She did also have a cardiac echo which showed that her ejection fraction was 60 to 65% and therefore I think she is a  candidate for hyperbarics. Again I do believe that this can help with preventing amputation which right now she has been told that her only option would be a below-knee amputation. Nonetheless I think that if we can get the heel leading to spread and the majority of this cleared away that it is possible we might even be able to get a surgeon just to surgically remove the nonviable toes and not have to go the full way of complete amputation. Nonetheless we do have a ways to go before we will get to that point. Either way I think the hyperbarics could be an excellent support as far as that is concerned. 06-05-2021 upon evaluation today patient's wound is showing some signs of improvement. This does seem to be slow and of course it is understandable considering what we are seeing. She has discussed and looked up information about hyperbarics and in the end comes back today and states that she does want to proceed. I honestly do feel like that is her best chance at saving the foot. Even if we are able to get her into a surgeon after we obtain some good healing approximately so that there is not as much necrotic tissue in general she may even be able to have a transmetatarsal amputation and get this to heal. Again I am not saying this is can be an easy road but it may be her best road as far as thinking about how to achieve some type of complete healing as I do not believe the toes are viable at all to be honest. 07-06-2021 upon evaluation today patient appears to be doing well currently in regard to her foot all things considered. Again she unfortunately was unable to do the hyperbaric oxygen therapy this was due to her claustrophobia. Nonetheless she tells me that she is pleased with how the foot appears and she does feel like there is good improvement going on here. In general I think that we are on the right track. If she is going to take a very long time and to be honest the route of autoamputation  which is pretty much the way this is going is not a quick or easy road but nonetheless she seems to be taking care of her foot quite well. She is in good spirits. 07-27-2021 upon evaluation today patient appears to be doing well currently in regard to her foot all things considered. A lot of the eschar is starting to lift up around the edges which is good there is still significant necrotic tissue on the distal portion of the foot. This is slowly going to work its way off. Based on what I am seeing currently I do not believe that there is any evidence of infection which is good news. [name] [name] [name]. ([id number]) Objective Constitutional Well-nourished and well-hydrated in no acute distress. Vitals Time Taken: 11:52 AM, Height: 64 in, Weight: 259 lbs, Source: Stated, BMI: 44.5, Temperature: 98 F, Pulse: 100 bpm, Respiratory Rate: 18 breaths/min, Blood Pressure:  118/71 mmHg. Respiratory normal breathing without difficulty. Psychiatric this patient is able to make decisions and demonstrates good insight into disease process. Alert and Oriented x 3. pleasant and cooperative. General Notes: Upon inspection patient's wound bed actually showed signs of good granulation and epithelization at this point. Fortunately I do not see any evidence of active infection locally or systemically which is great news and overall I think that we are on the right track. I did perform some debridement clearing some of the eschar around the edges of the wound. Again little by little while getting the self which hopefully will allow this to continue to heal much more effectively as we go. The entire wound was not debrided therefore the debridement size is smaller than the actual wound size. Integumentary (Hair, Skin) Wound #2 status is Open. Original cause of wound was Gradually Appeared. The date acquired was: 10/15/2020. The wound has been in treatment 10 weeks. The wound is located on the Right,Circumferential Foot.  The wound measures 16.2cm length x 8.5cm width x 0.9cm depth; 108.149cm^2 area and 97.334cm^3 volume. There is tendon and Fat Layer (Subcutaneous Tissue) exposed. There is a medium amount of serosanguineous drainage noted. There is small (1-33%) red granulation within the wound bed. There is a large (67-100%) amount of necrotic tissue within the wound bed including Eschar. Assessment Active Problems ICD-10 Chronic multifocal osteomyelitis, right ankle and foot Type 2 diabetes mellitus with foot ulcer Non-pressure chronic ulcer of other part of right foot with necrosis of bone Chronic combined systolic (congestive) and diastolic (congestive) heart failure Essential (primary) hypertension End stage renal disease Dependence on renal dialysis Dependence on supplemental oxygen Procedures Wound #2 Pre-procedure diagnosis of Wound #2 is a Diabetic Wound/Ulcer of the Lower Extremity located on the Right,Circumferential Foot .Severity of Tissue Pre Debridement is: Fat layer exposed. There was a Excisional Skin/Subcutaneous Tissue Debridement with a total area of 32 sq cm performed by Tommie Sams., PA-C. With the following instrument(s): Curette, Forceps, and Scissors to remove Viable and Non-Viable tissue/material. Material removed includes Eschar and Subcutaneous Tissue and after achieving pain control using Lidocaine 4% Topical Solution. No specimens were taken. A time out was conducted at 12:20, prior to the start of the procedure. A Minimum amount of bleeding was controlled with Pressure. The procedure was tolerated well with a pain level of 0 throughout and a pain level of 0 following the procedure. Post Debridement Measurements: 16.2cm length x 8.5cm width x 0.9cm depth; 97.334cm^3 volume. Character of Wound/Ulcer Post Debridement is stable. Severity of Tissue Post Debridement is: Fat layer exposed. Post procedure Diagnosis Wound #2: Same as Pre-Procedure [name] [name] [name].  ([id number]) Plan Follow-up Appointments: Return Appointment in 3 weeks. Nurse Visit as needed Home Health: [place] for wound care. May utilize formulary equivalent dressing for wound treatment orders unless otherwise specified. Home Health Nurse may visit [facility] to address patient s wound care needs. Scheduled days for dressing changes to be completed; exception, patient has scheduled wound care visit that day. **Please direct any NON-WOUND related issues/requests for orders to patient's Primary Care Physician. **If current dressing causes regression in wound condition, may D/C ordered dressing product/s and apply Normal Saline Moist Dressing daily until next [place] or Other MD appointment. **Notify [facility] of regression in wound condition at [phone]. Bathing/ Shower/ Hygiene: Clean wound with Normal Saline or wound cleanser. Wash wounds with antibacterial soap and water. May shower; gently cleanse wound with antibacterial  soap, rinse and pat dry prior to dressing wounds No tub bath. Off-Loading: Open toe surgical shoe - provided at visit Hyperbaric Oxygen Therapy: Evaluate for HBO Therapy Indication and location: - right foot If appropriate for treatment, begin HBOT per protocol: 2.5 ATA for 90 Minutes with 2 Five (5) Minute Air Breaks One treatment per day (delivered Monday through Friday unless otherwise specified in Special Instructions below): Total # of Treatments: - 40 Finger stick Blood Glucose Pre- and Post- HBOT Treatment. Follow Hyperbaric Oxygen Glycemia Protocol WOUND #2: - Foot Wound Laterality: Right, Circumferential Cleanser: Normal Saline 1 x Per Day/30 Days Discharge Instructions: Wash your hands with soap and water. Remove old dressing, discard into plastic bag and place into trash. Cleanse the wound with Normal Saline prior to applying a clean dressing using gauze sponges, not tissues or  cotton balls. Do not scrub or use excessive force. Pat dry using gauze sponges, not tissue or cotton balls. Cleanser: Soap and Water 1 x Per Day/30 Days Discharge Instructions: Gently cleanse wound with antibacterial soap, rinse and pat dry prior to dressing wounds Topical: Betadine 1 x Per Day/30 Days Discharge Instructions: Apply betadine as directed. apply to gauze and pack sites and lay op open areas to top of foot Primary Dressing: Gauze 1 x Per Day/30 Days Discharge Instructions: As directed: betadine applied Secondary Dressing: ABD Pad 5x9 (in/in) 1 x Per Day/30 Days Discharge Instructions: Cover with ABD pad Secured With: Medipore Tape - 77M Medipore H Soft Cloth Surgical Tape, 2x2 (in/yd) 1 x Per Day/30 Days Secured With: Kerlix Roll Sterile or Non-Sterile 6-ply 4.5x4 (yd/yd) 1 x Per Day/30 Days Discharge Instructions: Apply Kerlix as directed Secured With: [employer] Dressing, Latex-free, Size 5, Small-Head / Shoulder / Thigh 1 x Per Day/30 Days 1. I am get a recommend that we go and continue with the wound care measures as before and the patient is in agreement with the plan. This includes the use of the Betadine moistened gauze which I think is still good to be the right way to go. 2. Also can recommend that we have the patient continue with ABD pads to cover followed by roll gauze to secure in place. 3. She will continue with this daily and again we will plan to see her back for reevaluation in 3 weeks time per patient request. We will see patient back for reevaluation in 3 weeks here in the clinic. If anything worsens or changes patient will contact our office for additional recommendations. Electronic Signature(s) Signed: 07/27/2021 1:20:22 PM By: Worthy Keeler PA-C Entered By: Worthy Keeler on 07/27/2021 13:20:22 [name] [name] [name] ([id number]) -------------------------------------------------------------------------------- SuperBill Details Patient Name: [name] [name] [name]. Date of Service: 07/27/2021 Medical Record Number: [id number] Patient Account Number: [id number] Date of Birth/Sex: 12-04-1970 (51 y.o. F) Treating RN: Alycia Rossetti Primary Care Provider: Margurite Auerbach Other Clinician: Referring Provider: Margurite Auerbach Treating Provider/Extender: Jeri Cos Weeks in Treatment: 10 Diagnosis Coding ICD-10 Codes Code Description [phone] Chronic multifocal osteomyelitis, right ankle and foot E11.621 Type 2 diabetes mellitus with foot ulcer L97.514 Non-pressure chronic ulcer of other part of right foot with necrosis of bone I50.42 Chronic combined systolic (congestive) and diastolic (congestive) heart failure I10 Essential (primary) hypertension N18.6 End stage renal disease Z99.2 Dependence on renal dialysis Z99.81 Dependence on supplemental oxygen Facility Procedures CPT4 Code: 25956387 Description: 11042 - DEB SUBQ TISSUE 20 SQ CM/< Modifier: Quantity: 1 CPT4 Code: Description: ICD-10 Diagnosis Description E11.621 Type 2 diabetes mellitus with foot ulcer Modifier:  Quantity: CPT4 Code: [id number] Description: 29937 - DEB SUBQ TISS EA ADDL 20CM Modifier: Quantity: 1 CPT4 Code: Description: ICD-10 Diagnosis Description E11.621 Type 2 diabetes mellitus with foot ulcer Modifier: Quantity: Physician Procedures CPT4 Code: 1696789 Description: 38101 - WC PHYS SUBQ TISS 20 SQ CM Modifier: Quantity: 1 CPT4 Code: Description: ICD-10 Diagnosis Description E11.621 Type 2 diabetes mellitus with foot ulcer Modifier: Quantity: CPT4 Code: 7510258 Description: 52778 - WC PHYS SUBQ TISS EA ADDL 20 CM Modifier: Quantity: 1 CPT4 Code: Description: ICD-10 Diagnosis Description E11.621 Type 2 diabetes mellitus with foot ulcer Modifier: Quantity: Electronic Signature(s) Signed: 07/27/2021 1:20:45 PM By: Worthy Keeler PA-C Entered By: Worthy Keeler on 07/27/2021 13:20:44

## 2021-07-27 NOTE — Progress Notes (Addendum)
LADONA, [name] ([id number]) Visit Report for 07/27/2021 Arrival Information Details Patient Name: [name], [name] Date of Service: 07/27/2021 11:15 AM Medical Record Number: [id number] Patient Account Number: [id number] Date of Birth/Sex: 1970/11/14 (51 y.o. F) Treating RN: Alycia Rossetti Primary Care Paymon Rosensteel: Margurite Auerbach Other Clinician: Referring Orestes Geiman: Margurite Auerbach Treating Arlette Schaad/Extender: Jeri Cos Weeks in Treatment: 10 Visit Information History Since Last Visit Added or deleted any medications: No Patient Arrived: Wheel Chair Any new allergies or adverse reactions: No Arrival Time: 11:45 Hospitalized since last visit: No Accompanied By: Husband Pain Present Now: Yes Transfer Assistance: None Patient Requires Transmission-Based Precautions: No Patient Has Alerts: No Electronic Signature(s) Signed: 07/27/2021 4:28:01 PM By: Alycia Rossetti Entered By: Alycia Rossetti on 07/27/2021 11:51:32 Costanzo, [name] ([id number]) -------------------------------------------------------------------------------- Clinic Level of Care Assessment Details Patient Name: [name], [name] Date of Service: 07/27/2021 11:15 AM Medical Record Number: [id number] Patient Account Number: [id number] Date of Birth/Sex: [birth year] (51 y.o. F) Treating RN: Alycia Rossetti Primary Care Zianna Dercole: Margurite Auerbach Other Clinician: Referring Janayia Burggraf: Margurite Auerbach Treating Persephonie Hegwood/Extender: Jeri Cos Weeks in Treatment: 10 Clinic Level of Care Assessment Items TOOL 1 Quantity Score '[]'$  - Use when EandM and Procedure is performed on INITIAL visit 0 ASSESSMENTS - Nursing Assessment / Reassessment '[]'$  - General Physical Exam (combine w/ comprehensive assessment (listed just below) when performed on new 0 pt. evals) '[]'$  - 0 Comprehensive Assessment (HX, ROS, Risk Assessments, Wounds Hx, etc.) ASSESSMENTS - Wound and Skin Assessment / Reassessment '[]'$  - Dermatologic / Skin Assessment (not related to  wound area) 0 ASSESSMENTS - Ostomy and/or Continence Assessment and Care '[]'$  - Incontinence Assessment and Management 0 '[]'$  - 0 Ostomy Care Assessment and Management (repouching, etc.) PROCESS - Coordination of Care '[]'$  - Simple Patient / Family Education for ongoing care 0 '[]'$  - 0 Complex (extensive) Patient / Family Education for ongoing care '[]'$  - 0 Staff obtains Programmer, systems, Records, Test Results / Process Orders '[]'$  - 0 Staff telephones HHA, Nursing Homes / Clarify orders / etc '[]'$  - 0 Routine Transfer to another Facility (non-emergent condition) '[]'$  - 0 Routine Hospital Admission (non-emergent condition) '[]'$  - 0 New Admissions / Biomedical engineer / Ordering NPWT, Apligraf, etc. '[]'$  - 0 Emergency Hospital Admission (emergent condition) PROCESS - Special Needs '[]'$  - Pediatric / Minor Patient Management 0 '[]'$  - 0 Isolation Patient Management '[]'$  - 0 Hearing / Language / Visual special needs '[]'$  - 0 Assessment of Community assistance (transportation, D/C planning, etc.) '[]'$  - 0 Additional assistance / Altered mentation '[]'$  - 0 Support Surface(s) Assessment (bed, cushion, seat, etc.) INTERVENTIONS - Miscellaneous '[]'$  - External ear exam 0 '[]'$  - 0 Patient Transfer (multiple staff / Civil Service fast streamer / Similar devices) '[]'$  - 0 Simple Staple / Suture removal (25 or less) '[]'$  - 0 Complex Staple / Suture removal (26 or more) '[]'$  - 0 Hypo/Hyperglycemic Management (do not check if billed separately) '[]'$  - 0 Ankle / Brachial Index (ABI) - do not check if billed separately Has the patient been seen at the hospital within the last three years: Yes Total Score: 0 Level Of Care: ____ [name] ([id number]) Electronic Signature(s) Signed: 07/27/2021 4:28:01 PM By: Alycia Rossetti Entered By: Alycia Rossetti on 07/27/2021 12:45:47 [name], [name] ([id number]) -------------------------------------------------------------------------------- Encounter Discharge Information Details Patient Name:  [name], [name] Date of Service: 07/27/2021 11:15 AM Medical Record Number: [id number] Patient Account Number: [id number] Date of Birth/Sex: 1970/10/26 (51 y.o. F) Treating RN: Alycia Rossetti Primary Care Djibril Glogowski: Margurite Auerbach Other Clinician: Referring  Aiken Withem: Margurite Auerbach Treating Imagine Nest/Extender: Jeri Cos Weeks in Treatment: 10 Encounter Discharge Information Items Post Procedure Vitals Discharge Condition: Stable Temperature (F): 98 Ambulatory Status: Wheelchair Pulse (bpm): 100 Discharge Destination: Home Respiratory Rate (breaths/min): 18 Transportation: Private Auto Blood Pressure (mmHg): 118/71 Accompanied By: husband Schedule Follow-up Appointment: Yes Clinical Summary of Care: Electronic Signature(s) Signed: 07/27/2021 4:28:01 PM By: Alycia Rossetti Entered By: Alycia Rossetti on 07/27/2021 12:51:34 [name], [name] ([id number]) -------------------------------------------------------------------------------- Lower Extremity Assessment Details Patient Name: [name], [name] Date of Service: 07/27/2021 11:15 AM Medical Record Number: [id number] Patient Account Number: [id number] Date of Birth/Sex: Aug 28, 1970 (51 y.o. F) Treating RN: Alycia Rossetti Primary Care Mayo Owczarzak: Margurite Auerbach Other Clinician: Referring Eri Mcevers: Margurite Auerbach Treating Alejandro Gamel/Extender: Jeri Cos Weeks in Treatment: 10 Edema Assessment Assessed: [Left: No] [Right: No] [Left: Edema] [Right: :] Calf Left: Right: Point of Measurement: From Medial Instep 37 cm Ankle Left: Right: Point of Measurement: From Medial Instep 24.2 cm Vascular Assessment Pulses: Dorsalis Pedis Palpable: [Right:Yes] Electronic Signature(s) Signed: 07/27/2021 4:28:01 PM By: Alycia Rossetti Entered By: Alycia Rossetti on 07/27/2021 12:18:39 Jeffries, [name] ([id number]) -------------------------------------------------------------------------------- Multi Wound Chart Details Patient Name: Doland, [name] Date  of Service: 07/27/2021 11:15 AM Medical Record Number: [id number] Patient Account Number: [id number] Date of Birth/Sex: 11-Nov-1970 (51 y.o. 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 Comorbid History: Cataracts, Anemia, Asthma, Chronic N/A N/A Obstructive Pulmonary Disease (COPD), Congestive Heart Failure, Hypertension, Type II Diabetes, End Stage Renal Disease, Osteoarthritis, Neuropathy Date Acquired: 10/15/2020 N/A N/A Weeks of Treatment: 10 N/A N/A Wound Status: Open N/A N/A Wound Recurrence: No N/A N/A Pending Amputation on Yes N/A N/A Presentation: Measurements L x W x D (cm) 16.2x8.5x0.9 N/A N/A Area (cm) : 108.149 N/A N/A Volume (cm) : 97.334 N/A N/A % Reduction in Area: -2.00% N/A N/A % Reduction in Volume: 54.10% N/A N/A Classification: Grade 4 N/A N/A Exudate Amount: Medium N/A N/A Exudate Type: Serosanguineous N/A N/A Exudate Color: red, brown N/A N/A Granulation Amount: Small (1-33%) N/A N/A Granulation Quality: Red N/A N/A Necrotic Amount: Large (67-100%) N/A N/A Necrotic Tissue: Eschar N/A N/A Exposed Structures: Fat Layer (Subcutaneous Tissue): N/A N/A Yes Tendon: Yes Litaker, [name] ([id number]) Epithelialization: None N/A N/A Treatment Notes Electronic Signature(s) Signed: 07/27/2021 4:28:01 PM By: Alycia Rossetti Entered By: Alycia Rossetti on 07/27/2021 12:18:55 [place], [name]  ([id number]) -------------------------------------------------------------------------------- Leonore Details Patient Name: Nudelman, [name] Date of Service: 07/27/2021 11:15 AM Medical Record Number: [id number] Patient Account Number: [id number] Date of Birth/Sex: 1970-02-27 (51 y.o. F) Treating RN: Alycia Rossetti Primary Care Deepa Barthel: Margurite Auerbach Other Clinician: Referring Roma Bierlein: Margurite Auerbach Treating Denell Cothern/Extender: Jeri Cos Weeks in Treatment: 10 Active Inactive HBO Nursing Diagnoses: Anxiety related to feelings of confinement associated with the hyperbaric oxygen chamber Anxiety related to knowledge deficit of hyperbaric oxygen therapy and treatment procedures Discomfort related to temperature and humidity changes inside hyperbaric chamber Potential for barotraumas to ears, sinuses, teeth, and lungs or cerebral gas embolism related to changes in atmospheric pressure inside hyperbaric oxygen chamber Potential for oxygen toxicity seizures related to delivery of 100% oxygen at an increased atmospheric pressure Potential for pulmonary oxygen toxicity  related to delivery of 100% oxygen at an increased atmospheric pressure Goals: Barotrauma will be prevented during HBO2 Date Initiated: 06/05/2021 Target Resolution Date: 06/05/2021 Goal Status: Active Patient and/or family will be able to state/discuss factors appropriate to the management of their disease process during treatment Date Initiated: 06/05/2021 Target Resolution Date: 06/05/2021 Goal Status: Active Patient will tolerate the hyperbaric oxygen therapy treatment Date Initiated: 06/05/2021 Target Resolution Date: 06/05/2021 Goal Status: Active Patient will tolerate the internal climate of the chamber Date Initiated: 06/05/2021 Target Resolution Date: 06/05/2021 Goal Status: Active Patient/caregiver will verbalize understanding of HBO goals, rationale, procedures and potential hazards Date  Initiated: 06/05/2021 Target Resolution Date: 06/05/2021 Goal Status: Active Signs and symptoms of pulmonary oxygen toxicity will be recognized and promptly addressed Date Initiated: 06/05/2021 Target Resolution Date: 06/05/2021 Goal Status: Active Signs and symptoms of seizure will be recognized and promptly addressed ; seizing patients will suffer no harm Date Initiated: 06/05/2021 Target Resolution Date: 06/05/2021 Goal Status: Active Interventions: Administer a five (5) minute air break for patient if signs and symptoms of seizure appear and notify the hyperbaric physician Administer decongestants, per physician orders, prior to HBO2 Administer the correct therapeutic gas delivery based on the patients needs and limitations, per physician order Assess and provide for patientos comfort related to the hyperbaric environment and equalization of middle ear Assess for signs and symptoms related to adverse events, including but not limited to confinement anxiety, pneumothorax, oxygen toxicity and baurotrauma Assess patient for any history of confinement anxiety Assess patient's knowledge and expectations regarding hyperbaric medicine and provide education related to the hyperbaric environment, goals of treatment and prevention of adverse events Implement protocols to decrease risk of pneumothorax in high risk patients Notes: Necrotic Tissue Nursing Diagnoses: Impaired tissue integrity related to necrotic/devitalized tissue Brzoska, [name] ([id number]) Knowledge deficit related to management of necrotic/devitalized tissue Goals: Necrotic/devitalized tissue will be minimized in the wound bed Date Initiated: 06/05/2021 Target Resolution Date: 06/05/2021 Goal Status: Active Patient/caregiver will verbalize understanding of reason and process for debridement of necrotic tissue Date Initiated: 06/05/2021 Target Resolution Date: 06/05/2021 Goal Status: Active Interventions: Assess patient pain  level pre-, during and post procedure and prior to discharge Provide education on necrotic tissue and debridement process Notes: Orientation to the Wound Care Program Nursing Diagnoses: Knowledge deficit related to the wound healing center program Goals: Patient/caregiver will verbalize understanding of the [facility] Date Initiated: 05/18/2021 Target Resolution Date: 05/18/2021 Goal Status: Active Interventions: Provide education on orientation to the wound center Notes: Osteomyelitis Nursing Diagnoses: Infection: osteomyelitis Knowledge deficit related to disease process and management Potential for infection: osteomyelitis Goals: Diagnostic evaluation for osteomyelitis completed as ordered Date Initiated: 06/05/2021 Target Resolution Date: 06/05/2021 Goal Status: Active Patient/caregiver will verbalize understanding of disease process and disease management Date Initiated: 06/05/2021 Target Resolution Date: 06/05/2021 Goal Status: Active [name] osteomyelitis will resolve Date Initiated: 06/05/2021 Target Resolution Date: 06/05/2021 Goal Status: Active Signs and symptoms for osteomyelitis will be recognized and promptly addressed Date Initiated: 06/05/2021 Target Resolution Date: 06/05/2021 Goal Status: Active Interventions: Assess for signs and symptoms of osteomyelitis resolution every visit Provide education on osteomyelitis Notes: Wound/Skin Impairment Nursing Diagnoses: Impaired tissue integrity Knowledge deficit related to ulceration/compromised skin integrity Goals: Ulcer/skin breakdown will have a volume reduction of 30% by week 4 Date Initiated: 05/18/2021 Target Resolution Date: 06/15/2021 ELVENIA, GODDEN ([id number]) Goal Status: Active Ulcer/skin breakdown will have a volume reduction of 50% by week 8 Date Initiated: 05/18/2021 Target Resolution Date: 07/13/2021 Goal  Status: Active Ulcer/skin breakdown will have a volume reduction of 80% by  week 12 Date Initiated: 05/18/2021 Target Resolution Date: 08/10/2021 Goal Status: Active Ulcer/skin breakdown will heal within 14 weeks Date Initiated: 05/18/2021 Target Resolution Date: 08/24/2021 Goal Status: Active Interventions: Assess patient/caregiver ability to obtain necessary supplies Assess patient/caregiver ability to perform ulcer/skin care regimen upon admission and as needed Assess ulceration(s) every visit Provide education on ulcer and skin care Notes: Electronic Signature(s) Signed: 07/27/2021 4:28:01 PM By: Alycia Rossetti Entered By: Alycia Rossetti on 07/27/2021 12:18:47 [place], [name] ([id number]) -------------------------------------------------------------------------------- Pain Assessment Details Patient Name: Brucato, [name] Date of Service: 07/27/2021 11:15 AM Medical Record Number: [id number] Patient Account Number: [id number] Date of Birth/Sex: 1970/04/24 (51 y.o. F) Treating RN: Alycia Rossetti Primary Care Jakeisha Stricker: Margurite Auerbach Other Clinician: Referring Brin Ruggerio: Margurite Auerbach Treating Linette Gunderson/Extender: Jeri Cos Weeks in Treatment: 10 Active Problems Location of Pain Severity and Description of Pain Patient Has Paino Yes Site Locations Pain Location: Generalized Pain Pain Management and Medication Current Pain Management: Electronic Signature(s) Signed: 07/27/2021 4:28:01 PM By: Alycia Rossetti Entered By: Alycia Rossetti on 07/27/2021 11:52:44 Wirtanen, [name] ([id number]) -------------------------------------------------------------------------------- Patient/Caregiver Education Details Patient Name: Nary, [name] Date of Service: 07/27/2021 11:15 AM Medical Record Number: [id number] Patient Account Number: [id number] Date of Birth/Gender: 1970/12/06 (51 y.o. F) Treating RN: Alycia Rossetti Primary Care Physician: Margurite Auerbach Other Clinician: Referring Physician: Margurite Auerbach Treating Physician/Extender: Skipper Cliche in Treatment:  10 Education Assessment Education Provided To: Patient Education Topics Provided Wound/Skin Impairment: Methods: Demonstration, Explain/Verbal Responses: State content correctly Electronic Signature(s) Signed: 07/27/2021 4:28:01 PM By: Alycia Rossetti Entered By: Alycia Rossetti on 07/27/2021 12:49:24 Kayes, [name] ([id number]) -------------------------------------------------------------------------------- Wound Assessment Details Patient Name: Fels, [name] Date of Service: 07/27/2021 11:15 AM Medical Record Number: [id number] Patient Account Number: [id number] Date of Birth/Sex: 1970/06/07 (51 y.o. F) Treating RN: Alycia Rossetti Primary Care Ronique Simerly: Margurite Auerbach Other Clinician: Referring Coden Franchi: Margurite Auerbach Treating Paulette Rockford/Extender: Jeri Cos Weeks in Treatment: 10 Wound Status Wound Number: 2 Primary Diabetic Wound/Ulcer of the Lower Extremity Etiology: Wound Location: Right, Circumferential Foot Wound Open Wounding Event: Gradually Appeared Status: Date Acquired: 10/15/2020 Notes: open areas to midline right foot, lateral right foot, and top Weeks Of Treatment: 10 of right foot Clustered Wound: No Comorbid Cataracts, Anemia, Asthma, Chronic Obstructive Pulmonary Pending Amputation On Presentation History: Disease (COPD), Congestive Heart Failure, Hypertension, Type II Diabetes, End Stage Renal Disease, Osteoarthritis, Neuropathy Photos Wound Measurements Length: (cm) 16.2 Width: (cm) 8.5 Depth: (cm) 0.9 Area: (cm) 108.149 Volume: (cm) 97.334 % Reduction in Area: -2% % Reduction in Volume: 54.1% Epithelialization: None Wound Description Classification: Grade 4 Exudate Amount: Medium Exudate Type: Serosanguineous Exudate Color: red, brown Foul Odor After Cleansing: No Slough/Fibrino Yes Wound Bed Granulation Amount: Small (1-33%) Exposed Structure Granulation Quality: Red Fat Layer (Subcutaneous Tissue) Exposed: Yes Necrotic Amount: Large  (67-100%) Tendon Exposed: Yes Necrotic Quality: Eschar Treatment Notes Wound #2 (Foot) Wound Laterality: Right, Circumferential Cleanser Normal Saline Discharge Instruction: Wash your hands with soap and water. Remove old dressing, discard into plastic bag and place into trash. Cleanse the wound with Normal Saline prior to applying a clean dressing using gauze sponges, not tissues or cotton balls. Do not scrub or use excessive force. Pat dry using gauze sponges, not tissue or cotton balls. Soap and Water [place], [place] ([id number]) Discharge Instruction: Gently cleanse wound with antibacterial soap, rinse and pat dry prior to dressing wounds Peri-Wound Care Topical Betadine Discharge Instruction: Apply betadine as directed. apply  to gauze and pack sites and lay op open areas to top of foot Primary Dressing Gauze Discharge Instruction: As directed: betadine applied Secondary Dressing ABD Pad 5x9 (in/in) Discharge Instruction: Cover with ABD pad Secured With Medipore Tape - 30M Medipore H Soft Cloth Surgical Tape, 2x2 (in/yd) Kerlix Roll Sterile or Non-Sterile 6-ply 4.5x4 (yd/yd) Discharge Instruction: Apply Kerlix as directed Stretch Net Dressing, Latex-free, Size 5, Small-Head / Shoulder / Thigh Compression Wrap Compression Stockings Add-Ons Electronic Signature(s) Signed: 07/27/2021 4:28:01 PM By: Alycia Rossetti Entered By: Alycia Rossetti on 07/27/2021 12:18:34 [place], [name] ([id number]) -------------------------------------------------------------------------------- Vitals Details Patient Name: Reisz, [name] Date of Service: 07/27/2021 11:15 AM Medical Record Number: [id number] Patient Account Number: [id number] Date of Birth/Sex: November 05, 1970 (51 y.o. F) Treating RN: Alycia Rossetti Primary Care Dagny Fiorentino: Margurite Auerbach Other Clinician: Referring Telisha Zawadzki: Margurite Auerbach Treating Ruston Fedora/Extender: Jeri Cos Weeks in Treatment: 10 Vital Signs Time Taken:  11:52 Temperature (F): 98 Height (in): 64 Pulse (bpm): 100 Weight (lbs): 259 Respiratory Rate (breaths/min): 18 Source: Stated Blood Pressure (mmHg): 118/71 Body Mass Index (BMI): 44.5 Reference Range: 80 - 120 mg / dl Electronic Signature(s) Signed: 07/27/2021 4:28:01 PM By: Alycia Rossetti Entered By: Alycia Rossetti on 07/27/2021 11:52:30

## 2021-07-28 DIAGNOSIS — N186 End stage renal disease: Secondary | ICD-10-CM | POA: Diagnosis not present

## 2021-07-28 DIAGNOSIS — Z992 Dependence on renal dialysis: Secondary | ICD-10-CM | POA: Diagnosis not present

## 2021-07-29 DIAGNOSIS — E11621 Type 2 diabetes mellitus with foot ulcer: Secondary | ICD-10-CM | POA: Diagnosis not present

## 2021-07-29 DIAGNOSIS — I5032 Chronic diastolic (congestive) heart failure: Secondary | ICD-10-CM | POA: Diagnosis not present

## 2021-07-29 DIAGNOSIS — L97513 Non-pressure chronic ulcer of other part of right foot with necrosis of muscle: Secondary | ICD-10-CM | POA: Diagnosis not present

## 2021-07-29 DIAGNOSIS — Z794 Long term (current) use of insulin: Secondary | ICD-10-CM | POA: Diagnosis not present

## 2021-07-29 DIAGNOSIS — E1151 Type 2 diabetes mellitus with diabetic peripheral angiopathy without gangrene: Secondary | ICD-10-CM | POA: Diagnosis not present

## 2021-07-29 DIAGNOSIS — E1122 Type 2 diabetes mellitus with diabetic chronic kidney disease: Secondary | ICD-10-CM | POA: Diagnosis not present

## 2021-07-29 DIAGNOSIS — I132 Hypertensive heart and chronic kidney disease with heart failure and with stage 5 chronic kidney disease, or end stage renal disease: Secondary | ICD-10-CM | POA: Diagnosis not present

## 2021-07-29 DIAGNOSIS — D631 Anemia in chronic kidney disease: Secondary | ICD-10-CM | POA: Diagnosis not present

## 2021-07-29 DIAGNOSIS — N186 End stage renal disease: Secondary | ICD-10-CM | POA: Diagnosis not present

## 2021-07-29 DIAGNOSIS — Z9981 Dependence on supplemental oxygen: Secondary | ICD-10-CM | POA: Diagnosis not present

## 2021-07-29 DIAGNOSIS — Z992 Dependence on renal dialysis: Secondary | ICD-10-CM | POA: Diagnosis not present

## 2021-07-31 DIAGNOSIS — Z992 Dependence on renal dialysis: Secondary | ICD-10-CM | POA: Diagnosis not present

## 2021-07-31 DIAGNOSIS — J449 Chronic obstructive pulmonary disease, unspecified: Secondary | ICD-10-CM | POA: Diagnosis not present

## 2021-07-31 DIAGNOSIS — N186 End stage renal disease: Secondary | ICD-10-CM | POA: Diagnosis not present

## 2021-08-01 DIAGNOSIS — L97513 Non-pressure chronic ulcer of other part of right foot with necrosis of muscle: Secondary | ICD-10-CM | POA: Diagnosis not present

## 2021-08-01 DIAGNOSIS — E1122 Type 2 diabetes mellitus with diabetic chronic kidney disease: Secondary | ICD-10-CM | POA: Diagnosis not present

## 2021-08-01 DIAGNOSIS — Z794 Long term (current) use of insulin: Secondary | ICD-10-CM | POA: Diagnosis not present

## 2021-08-01 DIAGNOSIS — I132 Hypertensive heart and chronic kidney disease with heart failure and with stage 5 chronic kidney disease, or end stage renal disease: Secondary | ICD-10-CM | POA: Diagnosis not present

## 2021-08-01 DIAGNOSIS — E11621 Type 2 diabetes mellitus with foot ulcer: Secondary | ICD-10-CM | POA: Diagnosis not present

## 2021-08-01 DIAGNOSIS — I5032 Chronic diastolic (congestive) heart failure: Secondary | ICD-10-CM | POA: Diagnosis not present

## 2021-08-01 DIAGNOSIS — Z992 Dependence on renal dialysis: Secondary | ICD-10-CM | POA: Diagnosis not present

## 2021-08-01 DIAGNOSIS — N186 End stage renal disease: Secondary | ICD-10-CM | POA: Diagnosis not present

## 2021-08-01 DIAGNOSIS — E1151 Type 2 diabetes mellitus with diabetic peripheral angiopathy without gangrene: Secondary | ICD-10-CM | POA: Diagnosis not present

## 2021-08-01 DIAGNOSIS — Z9981 Dependence on supplemental oxygen: Secondary | ICD-10-CM | POA: Diagnosis not present

## 2021-08-01 DIAGNOSIS — D631 Anemia in chronic kidney disease: Secondary | ICD-10-CM | POA: Diagnosis not present

## 2021-08-02 DIAGNOSIS — N186 End stage renal disease: Secondary | ICD-10-CM | POA: Diagnosis not present

## 2021-08-02 DIAGNOSIS — Z992 Dependence on renal dialysis: Secondary | ICD-10-CM | POA: Diagnosis not present

## 2021-08-03 DIAGNOSIS — D631 Anemia in chronic kidney disease: Secondary | ICD-10-CM | POA: Diagnosis not present

## 2021-08-03 DIAGNOSIS — N186 End stage renal disease: Secondary | ICD-10-CM | POA: Diagnosis not present

## 2021-08-03 DIAGNOSIS — Z992 Dependence on renal dialysis: Secondary | ICD-10-CM | POA: Diagnosis not present

## 2021-08-03 DIAGNOSIS — E1122 Type 2 diabetes mellitus with diabetic chronic kidney disease: Secondary | ICD-10-CM | POA: Diagnosis not present

## 2021-08-03 DIAGNOSIS — Z794 Long term (current) use of insulin: Secondary | ICD-10-CM | POA: Diagnosis not present

## 2021-08-03 DIAGNOSIS — E11621 Type 2 diabetes mellitus with foot ulcer: Secondary | ICD-10-CM | POA: Diagnosis not present

## 2021-08-03 DIAGNOSIS — I5032 Chronic diastolic (congestive) heart failure: Secondary | ICD-10-CM | POA: Diagnosis not present

## 2021-08-03 DIAGNOSIS — I132 Hypertensive heart and chronic kidney disease with heart failure and with stage 5 chronic kidney disease, or end stage renal disease: Secondary | ICD-10-CM | POA: Diagnosis not present

## 2021-08-03 DIAGNOSIS — E1151 Type 2 diabetes mellitus with diabetic peripheral angiopathy without gangrene: Secondary | ICD-10-CM | POA: Diagnosis not present

## 2021-08-03 DIAGNOSIS — L97513 Non-pressure chronic ulcer of other part of right foot with necrosis of muscle: Secondary | ICD-10-CM | POA: Diagnosis not present

## 2021-08-03 DIAGNOSIS — Z9981 Dependence on supplemental oxygen: Secondary | ICD-10-CM | POA: Diagnosis not present

## 2021-08-04 DIAGNOSIS — Z992 Dependence on renal dialysis: Secondary | ICD-10-CM | POA: Diagnosis not present

## 2021-08-04 DIAGNOSIS — N186 End stage renal disease: Secondary | ICD-10-CM | POA: Diagnosis not present

## 2021-08-06 DIAGNOSIS — Z992 Dependence on renal dialysis: Secondary | ICD-10-CM | POA: Diagnosis not present

## 2021-08-06 DIAGNOSIS — Z794 Long term (current) use of insulin: Secondary | ICD-10-CM | POA: Diagnosis not present

## 2021-08-06 DIAGNOSIS — D631 Anemia in chronic kidney disease: Secondary | ICD-10-CM | POA: Diagnosis not present

## 2021-08-06 DIAGNOSIS — I5032 Chronic diastolic (congestive) heart failure: Secondary | ICD-10-CM | POA: Diagnosis not present

## 2021-08-06 DIAGNOSIS — I132 Hypertensive heart and chronic kidney disease with heart failure and with stage 5 chronic kidney disease, or end stage renal disease: Secondary | ICD-10-CM | POA: Diagnosis not present

## 2021-08-06 DIAGNOSIS — E1122 Type 2 diabetes mellitus with diabetic chronic kidney disease: Secondary | ICD-10-CM | POA: Diagnosis not present

## 2021-08-06 DIAGNOSIS — N186 End stage renal disease: Secondary | ICD-10-CM | POA: Diagnosis not present

## 2021-08-06 DIAGNOSIS — E1151 Type 2 diabetes mellitus with diabetic peripheral angiopathy without gangrene: Secondary | ICD-10-CM | POA: Diagnosis not present

## 2021-08-06 DIAGNOSIS — Z9981 Dependence on supplemental oxygen: Secondary | ICD-10-CM | POA: Diagnosis not present

## 2021-08-06 DIAGNOSIS — E11621 Type 2 diabetes mellitus with foot ulcer: Secondary | ICD-10-CM | POA: Diagnosis not present

## 2021-08-06 DIAGNOSIS — L97513 Non-pressure chronic ulcer of other part of right foot with necrosis of muscle: Secondary | ICD-10-CM | POA: Diagnosis not present

## 2021-08-07 DIAGNOSIS — J9610 Chronic respiratory failure, unspecified whether with hypoxia or hypercapnia: Secondary | ICD-10-CM | POA: Diagnosis not present

## 2021-08-07 DIAGNOSIS — J449 Chronic obstructive pulmonary disease, unspecified: Secondary | ICD-10-CM | POA: Diagnosis not present

## 2021-08-07 DIAGNOSIS — Z992 Dependence on renal dialysis: Secondary | ICD-10-CM | POA: Diagnosis not present

## 2021-08-07 DIAGNOSIS — L97509 Non-pressure chronic ulcer of other part of unspecified foot with unspecified severity: Secondary | ICD-10-CM | POA: Diagnosis not present

## 2021-08-07 DIAGNOSIS — I5030 Unspecified diastolic (congestive) heart failure: Secondary | ICD-10-CM | POA: Diagnosis not present

## 2021-08-07 DIAGNOSIS — N186 End stage renal disease: Secondary | ICD-10-CM | POA: Diagnosis not present

## 2021-08-07 DIAGNOSIS — I96 Gangrene, not elsewhere classified: Secondary | ICD-10-CM | POA: Diagnosis not present

## 2021-08-08 ENCOUNTER — Ambulatory Visit
Admission: RE | Admit: 2021-08-08 | Discharge: 2021-08-08 | Disposition: A | Discharge status: Home / Self Care | Payer: Medicare Other | Source: Ambulatory Visit | Attending: Nurse Practitioner | Admitting: Nurse Practitioner

## 2021-08-08 ENCOUNTER — Ambulatory Visit
Admission: RE | Admit: 2021-08-08 | Discharge: 2021-08-08 | Disposition: A | Discharge status: Home / Self Care | Payer: Medicare Other | Attending: Nurse Practitioner | Admitting: Nurse Practitioner

## 2021-08-08 ENCOUNTER — Other Ambulatory Visit: Payer: Self-pay | Admitting: Nurse Practitioner

## 2021-08-08 DIAGNOSIS — R079 Chest pain, unspecified: Secondary | ICD-10-CM | POA: Diagnosis not present

## 2021-08-08 DIAGNOSIS — Z0001 Encounter for general adult medical examination with abnormal findings: Secondary | ICD-10-CM | POA: Diagnosis not present

## 2021-08-08 DIAGNOSIS — J9811 Atelectasis: Secondary | ICD-10-CM | POA: Diagnosis not present

## 2021-08-08 DIAGNOSIS — E1122 Type 2 diabetes mellitus with diabetic chronic kidney disease: Secondary | ICD-10-CM | POA: Diagnosis not present

## 2021-08-08 DIAGNOSIS — R0600 Dyspnea, unspecified: Secondary | ICD-10-CM | POA: Diagnosis not present

## 2021-08-08 DIAGNOSIS — E11621 Type 2 diabetes mellitus with foot ulcer: Secondary | ICD-10-CM | POA: Diagnosis not present

## 2021-08-08 DIAGNOSIS — J45909 Unspecified asthma, uncomplicated: Secondary | ICD-10-CM | POA: Diagnosis not present

## 2021-08-08 DIAGNOSIS — N186 End stage renal disease: Secondary | ICD-10-CM | POA: Diagnosis not present

## 2021-08-08 DIAGNOSIS — I5032 Chronic diastolic (congestive) heart failure: Secondary | ICD-10-CM | POA: Diagnosis not present

## 2021-08-08 DIAGNOSIS — D631 Anemia in chronic kidney disease: Secondary | ICD-10-CM | POA: Diagnosis not present

## 2021-08-08 DIAGNOSIS — E1151 Type 2 diabetes mellitus with diabetic peripheral angiopathy without gangrene: Secondary | ICD-10-CM | POA: Diagnosis not present

## 2021-08-08 DIAGNOSIS — Z794 Long term (current) use of insulin: Secondary | ICD-10-CM | POA: Diagnosis not present

## 2021-08-08 DIAGNOSIS — I132 Hypertensive heart and chronic kidney disease with heart failure and with stage 5 chronic kidney disease, or end stage renal disease: Secondary | ICD-10-CM | POA: Diagnosis not present

## 2021-08-08 DIAGNOSIS — R059 Cough, unspecified: Secondary | ICD-10-CM | POA: Diagnosis not present

## 2021-08-08 DIAGNOSIS — Z9981 Dependence on supplemental oxygen: Secondary | ICD-10-CM | POA: Diagnosis not present

## 2021-08-08 DIAGNOSIS — E1165 Type 2 diabetes mellitus with hyperglycemia: Secondary | ICD-10-CM | POA: Diagnosis not present

## 2021-08-08 DIAGNOSIS — L97513 Non-pressure chronic ulcer of other part of right foot with necrosis of muscle: Secondary | ICD-10-CM | POA: Diagnosis not present

## 2021-08-08 DIAGNOSIS — Z992 Dependence on renal dialysis: Secondary | ICD-10-CM | POA: Diagnosis not present

## 2021-08-08 IMAGING — CR DG CHEST 2V
1 series · 2 of 2 positions shown · non-contrast
Comparison: [DATE]

CLINICAL DATA: Right-sided chest pain, shortness of breath, and
cough for 2 months. Asthma.

EXAM:
CHEST - 2 VIEW

[Series 1: dg chest 2 view · 0.14mm/px · 2 of 2 slices shown]
[im 1/2]
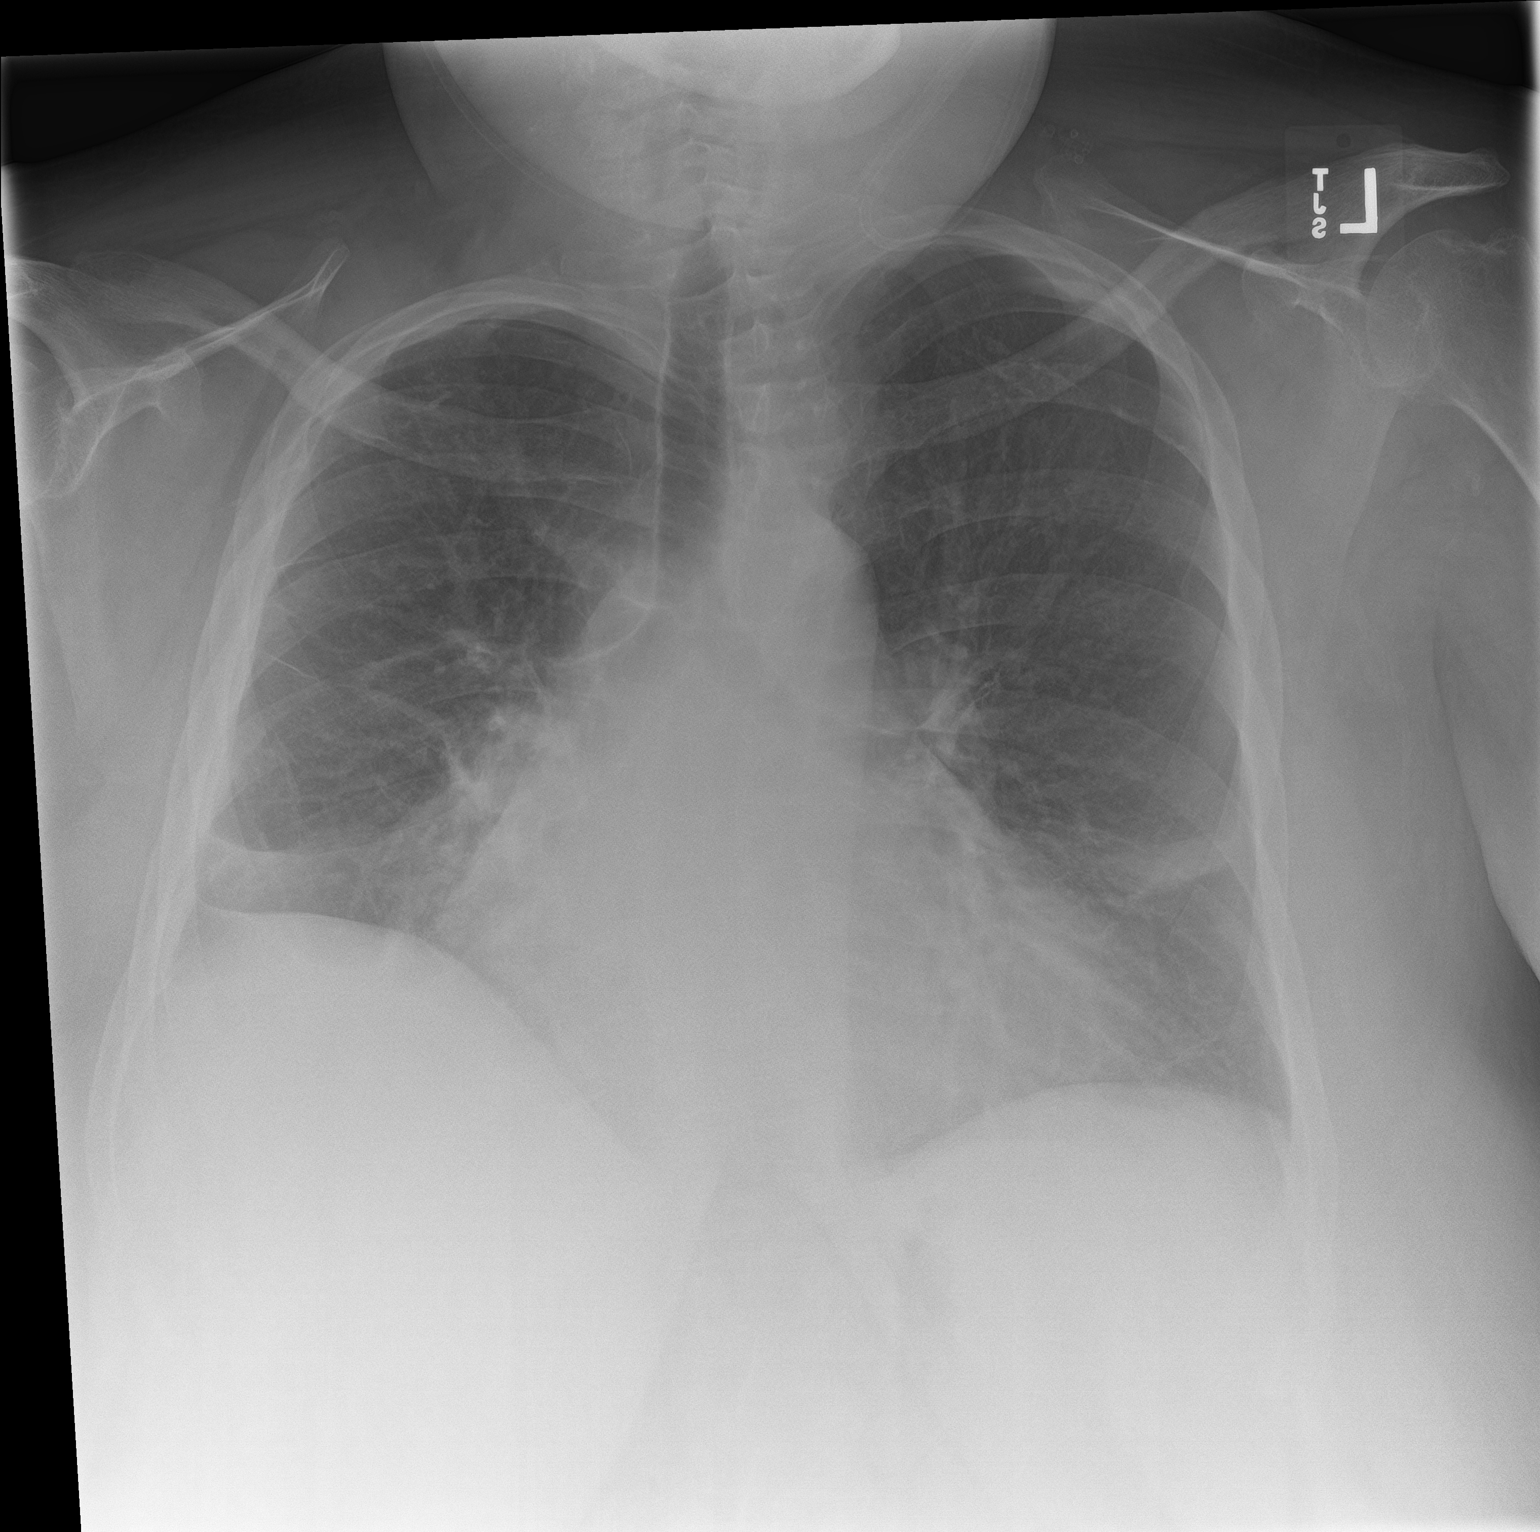
[im 2/2]
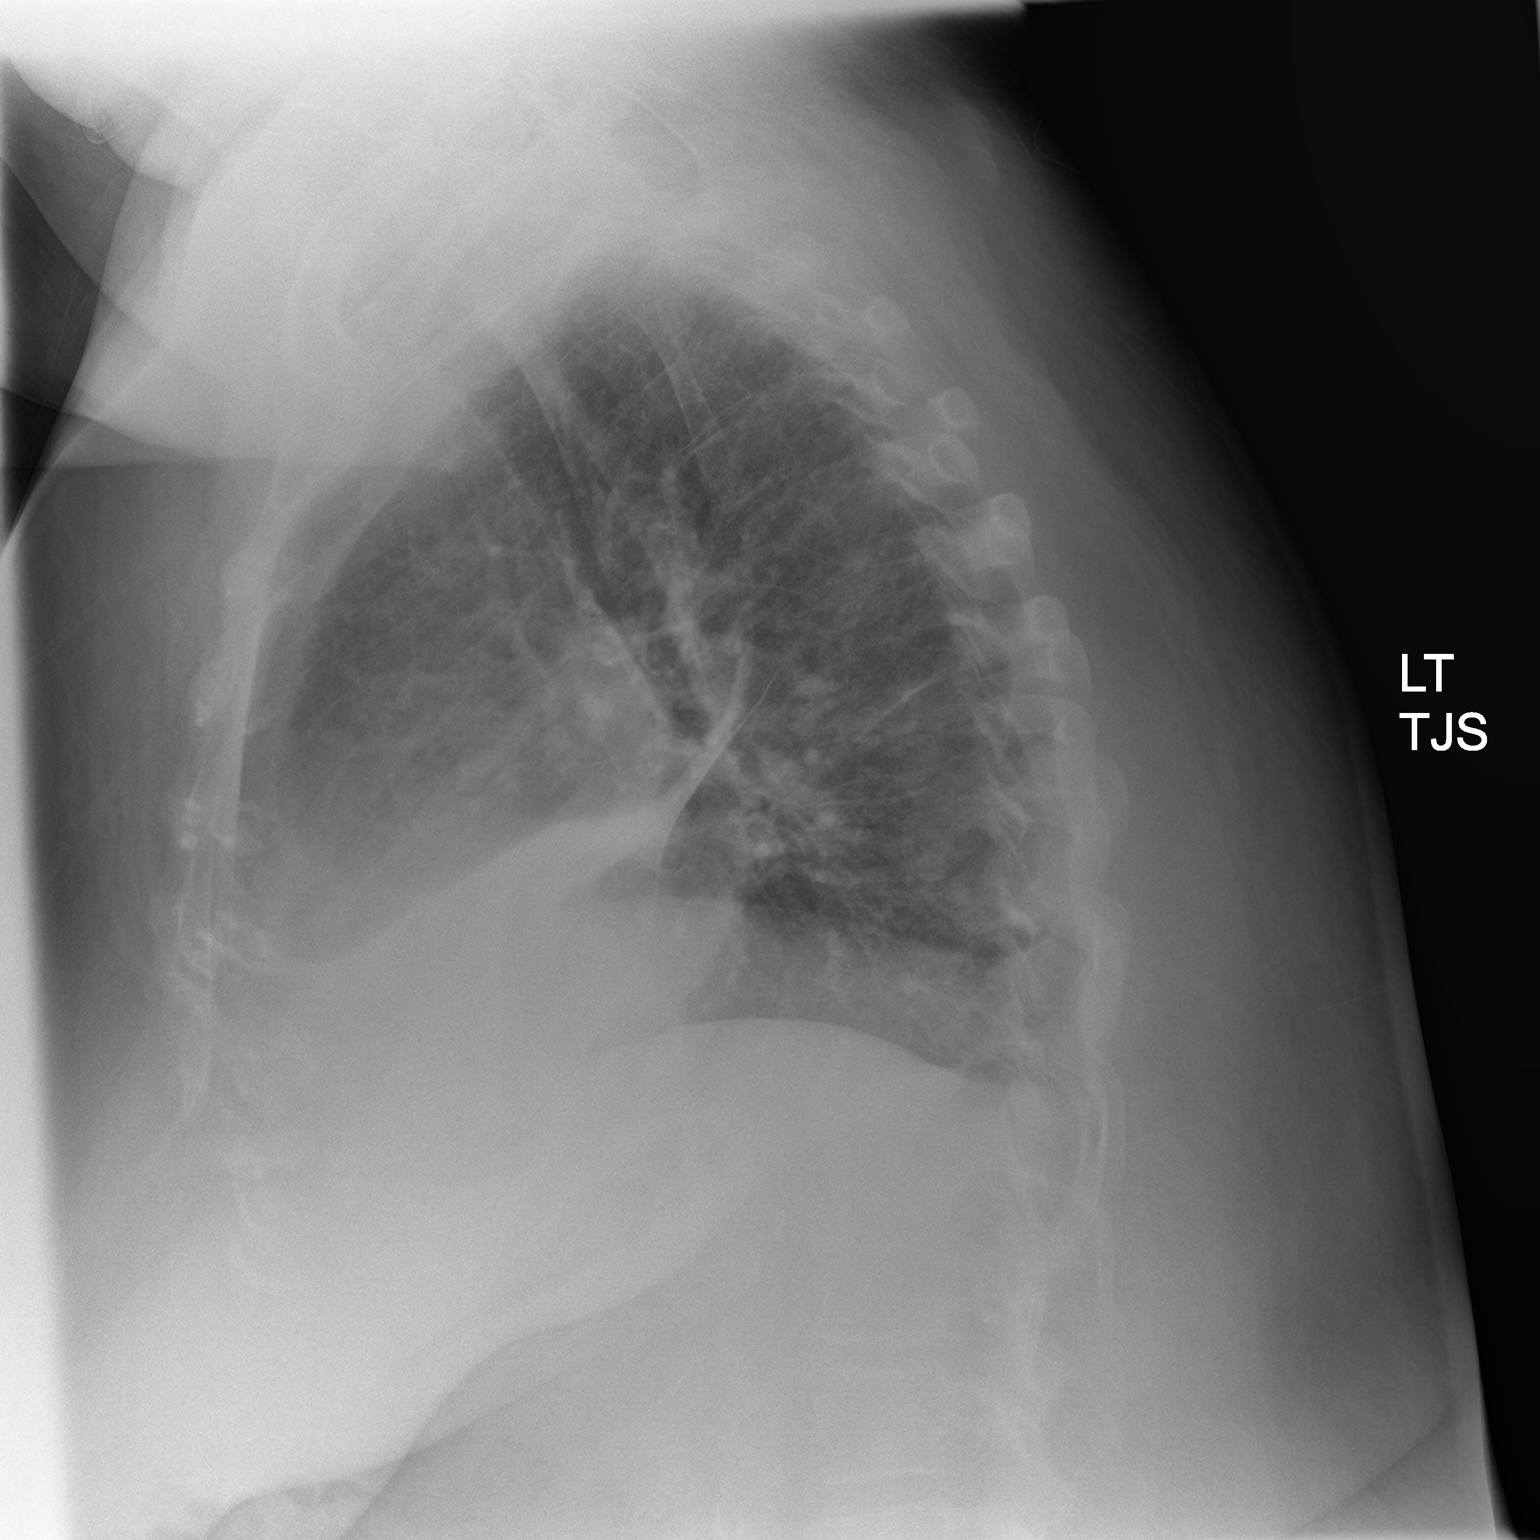

[2 of 2 positions shown; findings below may reference images not displayed]

FINDINGS: Moderate cardiomegaly remains stable pleural-parenchymal scarring in
the right lung base is unchanged. New bandlike opacity in the left
lower lung is consistent with subsegmental atelectasis. No evidence
of pulmonary consolidation or pleural effusion.
IMPRESSION: New left lower lung subsegmental atelectasis.

Stable cardiomegaly and right basilar pleural-parenchymal scarring.

## 2021-08-09 DIAGNOSIS — N186 End stage renal disease: Secondary | ICD-10-CM | POA: Diagnosis not present

## 2021-08-09 DIAGNOSIS — Z992 Dependence on renal dialysis: Secondary | ICD-10-CM | POA: Diagnosis not present

## 2021-08-10 DIAGNOSIS — E1122 Type 2 diabetes mellitus with diabetic chronic kidney disease: Secondary | ICD-10-CM | POA: Diagnosis not present

## 2021-08-10 DIAGNOSIS — Z992 Dependence on renal dialysis: Secondary | ICD-10-CM | POA: Diagnosis not present

## 2021-08-10 DIAGNOSIS — Z9981 Dependence on supplemental oxygen: Secondary | ICD-10-CM | POA: Diagnosis not present

## 2021-08-10 DIAGNOSIS — D631 Anemia in chronic kidney disease: Secondary | ICD-10-CM | POA: Diagnosis not present

## 2021-08-10 DIAGNOSIS — Z794 Long term (current) use of insulin: Secondary | ICD-10-CM | POA: Diagnosis not present

## 2021-08-10 DIAGNOSIS — E11621 Type 2 diabetes mellitus with foot ulcer: Secondary | ICD-10-CM | POA: Diagnosis not present

## 2021-08-10 DIAGNOSIS — I5032 Chronic diastolic (congestive) heart failure: Secondary | ICD-10-CM | POA: Diagnosis not present

## 2021-08-10 DIAGNOSIS — L97513 Non-pressure chronic ulcer of other part of right foot with necrosis of muscle: Secondary | ICD-10-CM | POA: Diagnosis not present

## 2021-08-10 DIAGNOSIS — E1151 Type 2 diabetes mellitus with diabetic peripheral angiopathy without gangrene: Secondary | ICD-10-CM | POA: Diagnosis not present

## 2021-08-10 DIAGNOSIS — N186 End stage renal disease: Secondary | ICD-10-CM | POA: Diagnosis not present

## 2021-08-10 DIAGNOSIS — I132 Hypertensive heart and chronic kidney disease with heart failure and with stage 5 chronic kidney disease, or end stage renal disease: Secondary | ICD-10-CM | POA: Diagnosis not present

## 2021-08-11 DIAGNOSIS — Z992 Dependence on renal dialysis: Secondary | ICD-10-CM | POA: Diagnosis not present

## 2021-08-11 DIAGNOSIS — N186 End stage renal disease: Secondary | ICD-10-CM | POA: Diagnosis not present

## 2021-08-12 DIAGNOSIS — J449 Chronic obstructive pulmonary disease, unspecified: Secondary | ICD-10-CM | POA: Diagnosis not present

## 2021-08-12 DIAGNOSIS — J9610 Chronic respiratory failure, unspecified whether with hypoxia or hypercapnia: Secondary | ICD-10-CM | POA: Diagnosis not present

## 2021-08-13 DIAGNOSIS — E1122 Type 2 diabetes mellitus with diabetic chronic kidney disease: Secondary | ICD-10-CM | POA: Diagnosis not present

## 2021-08-13 DIAGNOSIS — E1151 Type 2 diabetes mellitus with diabetic peripheral angiopathy without gangrene: Secondary | ICD-10-CM | POA: Diagnosis not present

## 2021-08-13 DIAGNOSIS — Z794 Long term (current) use of insulin: Secondary | ICD-10-CM | POA: Diagnosis not present

## 2021-08-13 DIAGNOSIS — N186 End stage renal disease: Secondary | ICD-10-CM | POA: Diagnosis not present

## 2021-08-13 DIAGNOSIS — Z9981 Dependence on supplemental oxygen: Secondary | ICD-10-CM | POA: Diagnosis not present

## 2021-08-13 DIAGNOSIS — Z992 Dependence on renal dialysis: Secondary | ICD-10-CM | POA: Diagnosis not present

## 2021-08-13 DIAGNOSIS — D631 Anemia in chronic kidney disease: Secondary | ICD-10-CM | POA: Diagnosis not present

## 2021-08-13 DIAGNOSIS — E11621 Type 2 diabetes mellitus with foot ulcer: Secondary | ICD-10-CM | POA: Diagnosis not present

## 2021-08-13 DIAGNOSIS — I5032 Chronic diastolic (congestive) heart failure: Secondary | ICD-10-CM | POA: Diagnosis not present

## 2021-08-13 DIAGNOSIS — L97513 Non-pressure chronic ulcer of other part of right foot with necrosis of muscle: Secondary | ICD-10-CM | POA: Diagnosis not present

## 2021-08-13 DIAGNOSIS — I132 Hypertensive heart and chronic kidney disease with heart failure and with stage 5 chronic kidney disease, or end stage renal disease: Secondary | ICD-10-CM | POA: Diagnosis not present

## 2021-08-14 DIAGNOSIS — N186 End stage renal disease: Secondary | ICD-10-CM | POA: Diagnosis not present

## 2021-08-14 DIAGNOSIS — Z992 Dependence on renal dialysis: Secondary | ICD-10-CM | POA: Diagnosis not present

## 2021-08-15 DIAGNOSIS — Z992 Dependence on renal dialysis: Secondary | ICD-10-CM | POA: Diagnosis not present

## 2021-08-15 DIAGNOSIS — E11621 Type 2 diabetes mellitus with foot ulcer: Secondary | ICD-10-CM | POA: Diagnosis not present

## 2021-08-15 DIAGNOSIS — L97513 Non-pressure chronic ulcer of other part of right foot with necrosis of muscle: Secondary | ICD-10-CM | POA: Diagnosis not present

## 2021-08-15 DIAGNOSIS — E1151 Type 2 diabetes mellitus with diabetic peripheral angiopathy without gangrene: Secondary | ICD-10-CM | POA: Diagnosis not present

## 2021-08-15 DIAGNOSIS — Z9981 Dependence on supplemental oxygen: Secondary | ICD-10-CM | POA: Diagnosis not present

## 2021-08-15 DIAGNOSIS — J449 Chronic obstructive pulmonary disease, unspecified: Secondary | ICD-10-CM | POA: Diagnosis not present

## 2021-08-15 DIAGNOSIS — N186 End stage renal disease: Secondary | ICD-10-CM | POA: Diagnosis not present

## 2021-08-15 DIAGNOSIS — I132 Hypertensive heart and chronic kidney disease with heart failure and with stage 5 chronic kidney disease, or end stage renal disease: Secondary | ICD-10-CM | POA: Diagnosis not present

## 2021-08-15 DIAGNOSIS — Z794 Long term (current) use of insulin: Secondary | ICD-10-CM | POA: Diagnosis not present

## 2021-08-15 DIAGNOSIS — E1122 Type 2 diabetes mellitus with diabetic chronic kidney disease: Secondary | ICD-10-CM | POA: Diagnosis not present

## 2021-08-15 DIAGNOSIS — D631 Anemia in chronic kidney disease: Secondary | ICD-10-CM | POA: Diagnosis not present

## 2021-08-15 DIAGNOSIS — I5032 Chronic diastolic (congestive) heart failure: Secondary | ICD-10-CM | POA: Diagnosis not present

## 2021-08-16 DIAGNOSIS — Z992 Dependence on renal dialysis: Secondary | ICD-10-CM | POA: Diagnosis not present

## 2021-08-16 DIAGNOSIS — N186 End stage renal disease: Secondary | ICD-10-CM | POA: Diagnosis not present

## 2021-08-17 ENCOUNTER — Ambulatory Visit: Payer: Medicare Other | Admitting: Physician Assistant

## 2021-08-17 DIAGNOSIS — N186 End stage renal disease: Secondary | ICD-10-CM | POA: Diagnosis not present

## 2021-08-17 DIAGNOSIS — L97519 Non-pressure chronic ulcer of other part of right foot with unspecified severity: Secondary | ICD-10-CM | POA: Diagnosis not present

## 2021-08-17 DIAGNOSIS — I739 Peripheral vascular disease, unspecified: Secondary | ICD-10-CM | POA: Diagnosis not present

## 2021-08-17 DIAGNOSIS — Z992 Dependence on renal dialysis: Secondary | ICD-10-CM | POA: Diagnosis not present

## 2021-08-17 DIAGNOSIS — I120 Hypertensive chronic kidney disease with stage 5 chronic kidney disease or end stage renal disease: Secondary | ICD-10-CM | POA: Diagnosis not present

## 2021-08-17 DIAGNOSIS — E1122 Type 2 diabetes mellitus with diabetic chronic kidney disease: Secondary | ICD-10-CM | POA: Diagnosis not present

## 2021-08-18 DIAGNOSIS — N186 End stage renal disease: Secondary | ICD-10-CM | POA: Diagnosis not present

## 2021-08-18 DIAGNOSIS — J9611 Chronic respiratory failure with hypoxia: Secondary | ICD-10-CM | POA: Diagnosis not present

## 2021-08-18 DIAGNOSIS — J449 Chronic obstructive pulmonary disease, unspecified: Secondary | ICD-10-CM | POA: Diagnosis not present

## 2021-08-18 DIAGNOSIS — Z992 Dependence on renal dialysis: Secondary | ICD-10-CM | POA: Diagnosis not present

## 2021-08-18 DIAGNOSIS — I5032 Chronic diastolic (congestive) heart failure: Secondary | ICD-10-CM | POA: Diagnosis not present

## 2021-08-20 DIAGNOSIS — Z794 Long term (current) use of insulin: Secondary | ICD-10-CM | POA: Diagnosis not present

## 2021-08-20 DIAGNOSIS — E1151 Type 2 diabetes mellitus with diabetic peripheral angiopathy without gangrene: Secondary | ICD-10-CM | POA: Diagnosis not present

## 2021-08-20 DIAGNOSIS — I132 Hypertensive heart and chronic kidney disease with heart failure and with stage 5 chronic kidney disease, or end stage renal disease: Secondary | ICD-10-CM | POA: Diagnosis not present

## 2021-08-20 DIAGNOSIS — I5032 Chronic diastolic (congestive) heart failure: Secondary | ICD-10-CM | POA: Diagnosis not present

## 2021-08-20 DIAGNOSIS — E1122 Type 2 diabetes mellitus with diabetic chronic kidney disease: Secondary | ICD-10-CM | POA: Diagnosis not present

## 2021-08-20 DIAGNOSIS — Z992 Dependence on renal dialysis: Secondary | ICD-10-CM | POA: Diagnosis not present

## 2021-08-20 DIAGNOSIS — E11621 Type 2 diabetes mellitus with foot ulcer: Secondary | ICD-10-CM | POA: Diagnosis not present

## 2021-08-20 DIAGNOSIS — L97513 Non-pressure chronic ulcer of other part of right foot with necrosis of muscle: Secondary | ICD-10-CM | POA: Diagnosis not present

## 2021-08-20 DIAGNOSIS — D631 Anemia in chronic kidney disease: Secondary | ICD-10-CM | POA: Diagnosis not present

## 2021-08-20 DIAGNOSIS — N186 End stage renal disease: Secondary | ICD-10-CM | POA: Diagnosis not present

## 2021-08-20 DIAGNOSIS — Z9981 Dependence on supplemental oxygen: Secondary | ICD-10-CM | POA: Diagnosis not present

## 2021-08-21 ENCOUNTER — Other Ambulatory Visit: Payer: Self-pay | Admitting: Student in an Organized Health Care Education/Training Program

## 2021-08-21 DIAGNOSIS — Z992 Dependence on renal dialysis: Secondary | ICD-10-CM | POA: Diagnosis not present

## 2021-08-21 DIAGNOSIS — N186 End stage renal disease: Secondary | ICD-10-CM | POA: Diagnosis not present

## 2021-08-22 DIAGNOSIS — E1151 Type 2 diabetes mellitus with diabetic peripheral angiopathy without gangrene: Secondary | ICD-10-CM | POA: Diagnosis not present

## 2021-08-22 DIAGNOSIS — L97513 Non-pressure chronic ulcer of other part of right foot with necrosis of muscle: Secondary | ICD-10-CM | POA: Diagnosis not present

## 2021-08-22 DIAGNOSIS — I5032 Chronic diastolic (congestive) heart failure: Secondary | ICD-10-CM | POA: Diagnosis not present

## 2021-08-22 DIAGNOSIS — E1122 Type 2 diabetes mellitus with diabetic chronic kidney disease: Secondary | ICD-10-CM | POA: Diagnosis not present

## 2021-08-22 DIAGNOSIS — Z9981 Dependence on supplemental oxygen: Secondary | ICD-10-CM | POA: Diagnosis not present

## 2021-08-22 DIAGNOSIS — D631 Anemia in chronic kidney disease: Secondary | ICD-10-CM | POA: Diagnosis not present

## 2021-08-22 DIAGNOSIS — Z992 Dependence on renal dialysis: Secondary | ICD-10-CM | POA: Diagnosis not present

## 2021-08-22 DIAGNOSIS — N186 End stage renal disease: Secondary | ICD-10-CM | POA: Diagnosis not present

## 2021-08-22 DIAGNOSIS — E11621 Type 2 diabetes mellitus with foot ulcer: Secondary | ICD-10-CM | POA: Diagnosis not present

## 2021-08-22 DIAGNOSIS — I132 Hypertensive heart and chronic kidney disease with heart failure and with stage 5 chronic kidney disease, or end stage renal disease: Secondary | ICD-10-CM | POA: Diagnosis not present

## 2021-08-22 DIAGNOSIS — H2511 Age-related nuclear cataract, right eye: Secondary | ICD-10-CM | POA: Diagnosis not present

## 2021-08-22 DIAGNOSIS — Z794 Long term (current) use of insulin: Secondary | ICD-10-CM | POA: Diagnosis not present

## 2021-08-23 DIAGNOSIS — N186 End stage renal disease: Secondary | ICD-10-CM | POA: Diagnosis not present

## 2021-08-23 DIAGNOSIS — Z992 Dependence on renal dialysis: Secondary | ICD-10-CM | POA: Diagnosis not present

## 2021-08-24 DIAGNOSIS — Z992 Dependence on renal dialysis: Secondary | ICD-10-CM | POA: Diagnosis not present

## 2021-08-24 DIAGNOSIS — E11621 Type 2 diabetes mellitus with foot ulcer: Secondary | ICD-10-CM | POA: Diagnosis not present

## 2021-08-24 DIAGNOSIS — D631 Anemia in chronic kidney disease: Secondary | ICD-10-CM | POA: Diagnosis not present

## 2021-08-24 DIAGNOSIS — Z9981 Dependence on supplemental oxygen: Secondary | ICD-10-CM | POA: Diagnosis not present

## 2021-08-24 DIAGNOSIS — I5032 Chronic diastolic (congestive) heart failure: Secondary | ICD-10-CM | POA: Diagnosis not present

## 2021-08-24 DIAGNOSIS — E1151 Type 2 diabetes mellitus with diabetic peripheral angiopathy without gangrene: Secondary | ICD-10-CM | POA: Diagnosis not present

## 2021-08-24 DIAGNOSIS — I132 Hypertensive heart and chronic kidney disease with heart failure and with stage 5 chronic kidney disease, or end stage renal disease: Secondary | ICD-10-CM | POA: Diagnosis not present

## 2021-08-24 DIAGNOSIS — E1122 Type 2 diabetes mellitus with diabetic chronic kidney disease: Secondary | ICD-10-CM | POA: Diagnosis not present

## 2021-08-24 DIAGNOSIS — N186 End stage renal disease: Secondary | ICD-10-CM | POA: Diagnosis not present

## 2021-08-24 DIAGNOSIS — L97513 Non-pressure chronic ulcer of other part of right foot with necrosis of muscle: Secondary | ICD-10-CM | POA: Diagnosis not present

## 2021-08-24 DIAGNOSIS — Z794 Long term (current) use of insulin: Secondary | ICD-10-CM | POA: Diagnosis not present

## 2021-08-25 DIAGNOSIS — J45909 Unspecified asthma, uncomplicated: Secondary | ICD-10-CM | POA: Diagnosis not present

## 2021-08-25 DIAGNOSIS — N186 End stage renal disease: Secondary | ICD-10-CM | POA: Diagnosis not present

## 2021-08-25 DIAGNOSIS — Z992 Dependence on renal dialysis: Secondary | ICD-10-CM | POA: Diagnosis not present

## 2021-08-25 DIAGNOSIS — I5040 Unspecified combined systolic (congestive) and diastolic (congestive) heart failure: Secondary | ICD-10-CM | POA: Diagnosis not present

## 2021-08-28 DIAGNOSIS — Z992 Dependence on renal dialysis: Secondary | ICD-10-CM | POA: Diagnosis not present

## 2021-08-28 DIAGNOSIS — N186 End stage renal disease: Secondary | ICD-10-CM | POA: Diagnosis not present

## 2021-08-29 DIAGNOSIS — N186 End stage renal disease: Secondary | ICD-10-CM | POA: Diagnosis not present

## 2021-08-29 DIAGNOSIS — E1122 Type 2 diabetes mellitus with diabetic chronic kidney disease: Secondary | ICD-10-CM | POA: Diagnosis not present

## 2021-08-29 DIAGNOSIS — E1151 Type 2 diabetes mellitus with diabetic peripheral angiopathy without gangrene: Secondary | ICD-10-CM | POA: Diagnosis not present

## 2021-08-29 DIAGNOSIS — L97513 Non-pressure chronic ulcer of other part of right foot with necrosis of muscle: Secondary | ICD-10-CM | POA: Diagnosis not present

## 2021-08-29 DIAGNOSIS — I5032 Chronic diastolic (congestive) heart failure: Secondary | ICD-10-CM | POA: Diagnosis not present

## 2021-08-29 DIAGNOSIS — Z794 Long term (current) use of insulin: Secondary | ICD-10-CM | POA: Diagnosis not present

## 2021-08-29 DIAGNOSIS — I132 Hypertensive heart and chronic kidney disease with heart failure and with stage 5 chronic kidney disease, or end stage renal disease: Secondary | ICD-10-CM | POA: Diagnosis not present

## 2021-08-29 DIAGNOSIS — E11621 Type 2 diabetes mellitus with foot ulcer: Secondary | ICD-10-CM | POA: Diagnosis not present

## 2021-08-29 DIAGNOSIS — D631 Anemia in chronic kidney disease: Secondary | ICD-10-CM | POA: Diagnosis not present

## 2021-08-29 DIAGNOSIS — Z9981 Dependence on supplemental oxygen: Secondary | ICD-10-CM | POA: Diagnosis not present

## 2021-08-29 DIAGNOSIS — Z992 Dependence on renal dialysis: Secondary | ICD-10-CM | POA: Diagnosis not present

## 2021-08-30 DIAGNOSIS — Z992 Dependence on renal dialysis: Secondary | ICD-10-CM | POA: Diagnosis not present

## 2021-08-30 DIAGNOSIS — N186 End stage renal disease: Secondary | ICD-10-CM | POA: Diagnosis not present

## 2021-08-31 ENCOUNTER — Encounter: Payer: Medicare Other | Attending: Physician Assistant | Admitting: Physician Assistant

## 2021-08-31 DIAGNOSIS — M86671 Other chronic osteomyelitis, right ankle and foot: Secondary | ICD-10-CM | POA: Diagnosis not present

## 2021-08-31 DIAGNOSIS — I5042 Chronic combined systolic (congestive) and diastolic (congestive) heart failure: Secondary | ICD-10-CM | POA: Diagnosis not present

## 2021-08-31 DIAGNOSIS — E11621 Type 2 diabetes mellitus with foot ulcer: Secondary | ICD-10-CM | POA: Diagnosis not present

## 2021-08-31 DIAGNOSIS — L97514 Non-pressure chronic ulcer of other part of right foot with necrosis of bone: Secondary | ICD-10-CM | POA: Insufficient documentation

## 2021-08-31 DIAGNOSIS — Z992 Dependence on renal dialysis: Secondary | ICD-10-CM | POA: Diagnosis not present

## 2021-08-31 DIAGNOSIS — Z9981 Dependence on supplemental oxygen: Secondary | ICD-10-CM | POA: Diagnosis not present

## 2021-08-31 DIAGNOSIS — N186 End stage renal disease: Secondary | ICD-10-CM | POA: Diagnosis not present

## 2021-08-31 DIAGNOSIS — L97515 Non-pressure chronic ulcer of other part of right foot with muscle involvement without evidence of necrosis: Secondary | ICD-10-CM | POA: Diagnosis not present

## 2021-08-31 DIAGNOSIS — I96 Gangrene, not elsewhere classified: Secondary | ICD-10-CM | POA: Diagnosis not present

## 2021-08-31 DIAGNOSIS — I132 Hypertensive heart and chronic kidney disease with heart failure and with stage 5 chronic kidney disease, or end stage renal disease: Secondary | ICD-10-CM | POA: Insufficient documentation

## 2021-08-31 DIAGNOSIS — E1122 Type 2 diabetes mellitus with diabetic chronic kidney disease: Secondary | ICD-10-CM | POA: Diagnosis not present

## 2021-08-31 DIAGNOSIS — E1169 Type 2 diabetes mellitus with other specified complication: Secondary | ICD-10-CM | POA: Insufficient documentation

## 2021-08-31 DIAGNOSIS — M86371 Chronic multifocal osteomyelitis, right ankle and foot: Secondary | ICD-10-CM | POA: Diagnosis not present

## 2021-08-31 NOTE — Progress Notes (Addendum)
SURIYAH, VERGARA ([id number]) Visit Report for 08/31/2021 Arrival Information Details Patient Name: [name], [name] Date of Service: 08/31/2021 11:15 AM Medical Record Number: [id number] Patient Account Number: [id number] Date of Birth/Sex: February 18, 1971 (51 y.o. F) Treating RN: Cornell Barman Primary Care Wylie Russon: Evern Bio Other Clinician: Referring Rilyn Scroggs: Evern Bio Treating Miosotis Wetsel/Extender: Skipper Cliche in Treatment: 15 Visit Information History Since Last Visit Added or deleted any medications: No Patient Arrived: Ambulatory Has Dressing in Place as Prescribed: Yes Arrival Time: 11:17 Pain Present Now: Yes Transfer Assistance: None Patient Identification Verified: Yes Secondary Verification Process Completed: Yes Patient Requires Transmission-Based Precautions: No Patient Has Alerts: No Electronic Signature(s) Signed: 09/04/2021 3:46:53 PM By: Gretta Cool, BSN, RN, CWS, Kim RN, BSN Entered By: Gretta Cool, BSN, RN, CWS, Kim on 08/31/2021 11:19:01 Baiz, [name] ([id number]) -------------------------------------------------------------------------------- Clinic Level of Care Assessment Details Patient Name: Goodridge, [name] Date of Service: 08/31/2021 11:15 AM Medical Record Number: [id number] Patient Account Number: [id number] Date of Birth/Sex: 1970-11-07 (51 y.o. F) Treating RN: Cornell Barman Primary Care Madelin Weseman: Evern Bio Other Clinician: Referring Kalyani Maeda: Evern Bio Treating Laetitia Schnepf/Extender: Skipper Cliche in Treatment: 15 Clinic Level of Care Assessment Items TOOL 4 Quantity Score '[]'$  - Use when only an EandM is performed on FOLLOW-UP visit 0 ASSESSMENTS - Nursing Assessment / Reassessment X - Reassessment of Co-morbidities (includes updates in patient status) 1 10 X- 1 5 Reassessment of Adherence to Treatment Plan ASSESSMENTS - Wound and Skin Assessment / Reassessment X - Simple Wound Assessment / Reassessment - one wound 1 5 '[]'$  - 0 Complex Wound  Assessment / Reassessment - multiple wounds '[]'$  - 0 Dermatologic / Skin Assessment (not related to wound area) ASSESSMENTS - Focused Assessment '[]'$  - Circumferential Edema Measurements - multi extremities 0 '[]'$  - 0 Nutritional Assessment / Counseling / Intervention '[]'$  - 0 Lower Extremity Assessment (monofilament, tuning fork, pulses) '[]'$  - 0 Peripheral Arterial Disease Assessment (using hand held doppler) ASSESSMENTS - Ostomy and/or Continence Assessment and Care '[]'$  - Incontinence Assessment and Management 0 '[]'$  - 0 Ostomy Care Assessment and Management (repouching, etc.) PROCESS - Coordination of Care X - Simple Patient / Family Education for ongoing care 1 15 '[]'$  - 0 Complex (extensive) Patient / Family Education for ongoing care X- 1 10 Staff obtains Programmer, systems, Records, Test Results / Process Orders '[]'$  - 0 Staff telephones HHA, Nursing Homes / Clarify orders / etc '[]'$  - 0 Routine Transfer to another Facility (non-emergent condition) '[]'$  - 0 Routine Hospital Admission (non-emergent condition) '[]'$  - 0 New Admissions / Biomedical engineer / Ordering NPWT, Apligraf, etc. '[]'$  - 0 Emergency Hospital Admission (emergent condition) X- 1 10 Simple Discharge Coordination '[]'$  - 0 Complex (extensive) Discharge Coordination PROCESS - Special Needs '[]'$  - Pediatric / Minor Patient Management 0 '[]'$  - 0 Isolation Patient Management '[]'$  - 0 Hearing / Language / Visual special needs '[]'$  - 0 Assessment of Community assistance (transportation, D/C planning, etc.) '[]'$  - 0 Additional assistance / Altered mentation '[]'$  - 0 Support Surface(s) Assessment (bed, cushion, seat, etc.) INTERVENTIONS - Wound Cleansing / Measurement Mia, Charmelle M. ([id number]) X- 1 5 Simple Wound Cleansing - one wound '[]'$  - 0 Complex Wound Cleansing - multiple wounds X- 1 5 Wound Imaging (photographs - any number of wounds) '[]'$  - 0 Wound Tracing (instead of photographs) X- 1 5 Simple Wound Measurement - one wound '[]'$   - 0 Complex Wound Measurement - multiple wounds INTERVENTIONS - Wound Dressings '[]'$  - Small Wound Dressing one or multiple wounds 0 X-  1 15 Medium Wound Dressing one or multiple wounds '[]'$  - 0 Large Wound Dressing one or multiple wounds '[]'$  - 0 Application of Medications - topical '[]'$  - 0 Application of Medications - injection INTERVENTIONS - Miscellaneous '[]'$  - External ear exam 0 '[]'$  - 0 Specimen Collection (cultures, biopsies, blood, body fluids, etc.) '[]'$  - 0 Specimen(s) / Culture(s) sent or taken to Lab for analysis '[]'$  - 0 Patient Transfer (multiple staff / Civil Service fast streamer / Similar devices) '[]'$  - 0 Simple Staple / Suture removal (25 or less) '[]'$  - 0 Complex Staple / Suture removal (26 or more) '[]'$  - 0 Hypo / Hyperglycemic Management (close monitor of Blood Glucose) '[]'$  - 0 Ankle / Brachial Index (ABI) - do not check if billed separately X- 1 5 Vital Signs Has the patient been seen at the hospital within the last three years: Yes Total Score: 90 Level Of Care: New/Established - Level 3 Electronic Signature(s) Signed: 09/04/2021 3:46:53 PM By: Gretta Cool, BSN, RN, CWS, Kim RN, BSN Entered By: Gretta Cool, BSN, RN, CWS, Kim on 08/31/2021 11:53:39 Quesnell, [name] ([id number]) -------------------------------------------------------------------------------- Encounter Discharge Information Details Patient Name: Pizzimenti, [name] Date of Service: 08/31/2021 11:15 AM Medical Record Number: [id number] Patient Account Number: [id number] Date of Birth/Sex: 1971-01-09 (51 y.o. F) Treating RN: Cornell Barman Primary Care Kimberleigh Mehan: Evern Bio Other Clinician: Referring Shed Nixon: Evern Bio Treating Samuele Storey/Extender: Skipper Cliche in Treatment: 15 Encounter Discharge Information Items Discharge Condition: Stable Ambulatory Status: Other Discharge Destination: Home Transportation: Private Auto Accompanied By: husband Schedule Follow-up Appointment: Yes Clinical Summary of Care: Electronic  Signature(s) Signed: 09/04/2021 3:46:53 PM By: Gretta Cool, BSN, RN, CWS, Kim RN, BSN Entered By: Gretta Cool, BSN, RN, CWS, Kim on 08/31/2021 11:54:48 Burdi, [name] ([id number]) -------------------------------------------------------------------------------- Lower Extremity Assessment Details Patient Name: Kapfer, [name] Date of Service: 08/31/2021 11:15 AM Medical Record Number: [id number] Patient Account Number: [id number] Date of Birth/Sex: Feb 23, 1971 (51 y.o. F) Treating RN: Cornell Barman Primary Care Davinci Glotfelty: Evern Bio Other Clinician: Referring [employer]: Evern Bio Treating Constantina Laseter/Extender: Skipper Cliche in Treatment: 15 Vascular Assessment Pulses: Dorsalis Pedis Palpable: [Right:Yes] Electronic Signature(s) Signed: 09/04/2021 3:46:53 PM By: Gretta Cool, BSN, RN, CWS, Kim RN, BSN Entered By: Gretta Cool, BSN, RN, CWS, Kim on 08/31/2021 11:28:56 Prada, [name] ([id number]) -------------------------------------------------------------------------------- Multi Wound Chart Details Patient Name: Kennard, [name] Date of Service: 08/31/2021 11:15 AM Medical Record Number: [id number] Patient Account Number: [id number] Date of Birth/Sex: Aug 06, 1970 (51 y.o. 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00%] [N/A:N/A] Classification: [2:Grade 4] [N/A:N/A] Exudate Amount: [2:Medium] [N/A:N/A] Exudate Type: [2:Serosanguineous] [N/A:N/A] Exudate Color: [2:red, brown] [N/A:N/A] Granulation Amount: [2:Small (1-33%)] [N/A:N/A] Granulation Quality: [2:Red] [N/A:N/A] Necrotic Amount: [2:Large (67-100%)] [N/A:N/A] Necrotic Tissue: [2:Eschar] [N/A:N/A] Exposed Structures: [  2:Fat Layer (Subcutaneous Tissue): Yes Tendon: Yes] [N/A:N/A] Epithelialization: [2:None] [N/A:N/A] Assessment Notes: [2:Toes are black with limited blood flow.] [N/A:N/A] Treatment Notes Electronic Signature(s) Signed: 09/04/2021 3:46:53 PM By: Gretta Cool, BSN, RN, CWS, Kim RN, BSN Entered By: Gretta Cool, BSN, RN, CWS, Kim on 08/31/2021 11:29:22 Montalvo, [name] ([id number]) -------------------------------------------------------------------------------- [place] Details Patient Name: Kees, JAVAE BRAATEN. Date of Service: 08/31/2021 11:15 AM Medical Record Number: [id number] Patient Account Number: [id number] Date of Birth/Sex: July 29, 1970 (51 y.o. F) Treating RN: Cornell Barman Primary Care Trenisha Lafavor: Evern Bio Other Clinician: Referring Laniah Grimm: Evern Bio Treating Evani Shrider/Extender: Skipper Cliche in Treatment: 15 Active Inactive HBO Nursing Diagnoses: Anxiety related to feelings of confinement associated with the hyperbaric oxygen chamber Anxiety related to knowledge deficit of hyperbaric oxygen therapy and treatment procedures Discomfort related to temperature and humidity  changes inside hyperbaric chamber Potential for barotraumas to ears, sinuses, teeth, and lungs or cerebral gas embolism related to changes in atmospheric pressure inside hyperbaric oxygen chamber Potential for oxygen toxicity seizures related to delivery of 100% oxygen at an increased atmospheric pressure Potential for pulmonary oxygen toxicity related to delivery of 100% oxygen at an increased atmospheric pressure Goals: Barotrauma will be prevented during HBO2 Date Initiated: 06/05/2021 Target Resolution Date: 06/05/2021 Goal Status: Active Patient and/or family will be able to state/discuss factors appropriate to the management of their disease process during treatment Date Initiated: 06/05/2021 Target Resolution Date: 06/05/2021 Goal Status: Active Patient will tolerate the hyperbaric oxygen therapy treatment Date Initiated: 06/05/2021 Target Resolution Date: 06/05/2021 Goal Status: Active Patient will tolerate the internal climate of the chamber Date Initiated: 06/05/2021 Target Resolution Date: 06/05/2021 Goal Status: Active Patient/caregiver will verbalize understanding of HBO goals, rationale, procedures and potential hazards Date Initiated: 06/05/2021 Target Resolution Date: 06/05/2021 Goal Status: Active Signs and symptoms of pulmonary oxygen toxicity will be recognized and promptly addressed Date Initiated: 06/05/2021 Target Resolution Date: 06/05/2021 Goal Status: Active Signs and symptoms of seizure will be recognized and promptly addressed ; seizing patients will suffer no harm Date Initiated: 06/05/2021 Target Resolution Date: 06/05/2021 Goal Status: Active Interventions: Administer a five (5) minute air break for patient if signs and symptoms of seizure appear and notify the hyperbaric physician Administer decongestants, per physician orders, prior to HBO2 Administer the correct therapeutic gas delivery based on the patients needs and limitations, per physician order Assess  and provide for patientos comfort related to the hyperbaric environment and equalization of middle ear Assess for signs and symptoms related to adverse events, including but not limited to confinement anxiety, pneumothorax, oxygen toxicity and baurotrauma Assess patient for any history of confinement anxiety Assess patient's knowledge and expectations regarding hyperbaric medicine and provide education related to the hyperbaric environment, goals of treatment and prevention of adverse events Implement protocols to decrease risk of pneumothorax in high risk patients Notes: Necrotic Tissue Nursing Diagnoses: Impaired tissue integrity related to necrotic/devitalized tissue Dubeau, [name] ([id number]) Knowledge deficit related to management of necrotic/devitalized tissue Goals: Necrotic/devitalized tissue will be minimized in the wound bed Date Initiated: 06/05/2021 Target Resolution Date: 06/05/2021 Goal Status: Active Patient/caregiver will verbalize understanding of reason and process for debridement of necrotic tissue Date Initiated: 06/05/2021 Target Resolution Date: 06/05/2021 Goal Status: Active Interventions: Assess patient pain level pre-, during and post procedure and prior to discharge Provide education on necrotic tissue and debridement process Notes: Orientation to the Wound Care Program Nursing Diagnoses: Knowledge deficit related to the wound healing center program Goals: Patient/caregiver will verbalize understanding of the [facility]  Program Date Initiated: 05/18/2021 Target Resolution Date: 05/18/2021 Goal Status: Active Interventions: Provide education on orientation to the wound center Notes: Osteomyelitis Nursing Diagnoses: Infection: osteomyelitis Knowledge deficit related to disease process and management Potential for infection: osteomyelitis Goals: Diagnostic evaluation for osteomyelitis completed as ordered Date Initiated: 06/05/2021 Target  Resolution Date: 06/05/2021 Goal Status: Active Patient/caregiver will verbalize understanding of disease process and disease management Date Initiated: 06/05/2021 Target Resolution Date: 06/05/2021 Goal Status: Active [name] osteomyelitis will resolve Date Initiated: 06/05/2021 Target Resolution Date: 06/05/2021 Goal Status: Active Signs and symptoms for osteomyelitis will be recognized and promptly addressed Date Initiated: 06/05/2021 Target Resolution Date: 06/05/2021 Goal Status: Active Interventions: Assess for signs and symptoms of osteomyelitis resolution every visit Provide education on osteomyelitis Notes: Wound/Skin Impairment Nursing Diagnoses: Impaired tissue integrity Knowledge deficit related to ulceration/compromised skin integrity Goals: Ulcer/skin breakdown will have a volume reduction of 30% by week 4 Date Initiated: 05/18/2021 Target Resolution Date: 06/15/2021 MAY, MANRIQUE ([id number]) Goal Status: Active Ulcer/skin breakdown will have a volume reduction of 50% by week 8 Date Initiated: 05/18/2021 Target Resolution Date: 07/13/2021 Goal Status: Active Ulcer/skin breakdown will have a volume reduction of 80% by week 12 Date Initiated: 05/18/2021 Target Resolution Date: 08/10/2021 Goal Status: Active Ulcer/skin breakdown will heal within 14 weeks Date Initiated: 05/18/2021 Target Resolution Date: 08/24/2021 Goal Status: Active Interventions: Assess patient/caregiver ability to obtain necessary supplies Assess patient/caregiver ability to perform ulcer/skin care regimen upon admission and as needed Assess ulceration(s) every visit Provide education on ulcer and skin care Notes: Electronic Signature(s) Signed: 09/04/2021 3:46:53 PM By: Gretta Cool, BSN, RN, CWS, Kim RN, BSN Entered By: Gretta Cool, BSN, RN, CWS, Kim on 08/31/2021 11:29:07 Notch, [name] ([id number]) -------------------------------------------------------------------------------- Pain Assessment  Details Patient Name: Samonte, [name] Date of Service: 08/31/2021 11:15 AM Medical Record Number: [id number] Patient Account Number: [id number] Date of Birth/Sex: 04-27-1970 (52 y.o. F) Treating RN: Cornell Barman Primary Care Nadeen Shipman: Evern Bio Other Clinician: Referring Winson Eichorn: Evern Bio Treating Aariya Ferrick/Extender: Jeri Cos Weeks in Treatment: 15 Active Problems Location of Pain Severity and Description of Pain Patient Has Paino Yes Site Locations Rate the pain. Current Pain Level: 4 Pain Management and Medication Current Pain Management: Electronic Signature(s) Signed: 09/04/2021 3:46:53 PM By: Gretta Cool, BSN, RN, CWS, Kim RN, BSN Entered By: Gretta Cool, BSN, RN, CWS, Kim on 08/31/2021 11:21:34 Grahn, [name] ([id number]) -------------------------------------------------------------------------------- Patient/Caregiver Education Details Patient Name: Eltringham, [name] Date of Service: 08/31/2021 11:15 AM Medical Record Number: [id number] Patient Account Number: [id number] Date of Birth/Gender: 19-Aug-1970 (51 y.o. F) Treating RN: Cornell Barman Primary Care Physician: Evern Bio Other Clinician: Referring Physician: Evern Bio Treating Physician/Extender: Skipper Cliche in Treatment: 15 Education Assessment Education Provided To: Caregiver Education Topics Provided Wound/Skin Impairment: Handouts: Caring for Your Ulcer Methods: Demonstration, Explain/Verbal Responses: State content correctly [employer]) Signed: 09/04/2021 3:46:53 PM By: Gretta Cool, BSN, RN, CWS, Kim RN, BSN Entered By: Gretta Cool, BSN, RN, CWS, Kim on 08/31/2021 11:54:05 Ishibashi, [name] ([id number]) -------------------------------------------------------------------------------- Wound Assessment Details Patient Name: Siebenaler, [name] Date of Service: 08/31/2021 11:15 AM Medical Record Number: [id number] Patient Account Number: [id number] Date of Birth/Sex: [birth year] (51 y.o. F) Treating RN:  Cornell Barman Primary Care Eraina Winnie: Evern Bio Other Clinician: Referring Drake Wuertz: Evern Bio Treating Logen Heintzelman/Extender: Jeri Cos Weeks in Treatment: 15 Wound Status Wound Number: 2 Primary Diabetic Wound/Ulcer of the Lower Extremity Etiology: Wound Location: Right, Circumferential Foot Wound Open Wounding Event: Gradually Appeared Status: Date Acquired: 10/15/2020 Notes: open areas to midline right foot, lateral right foot, and  top Weeks Of Treatment: 15 of right foot Clustered Wound: No Comorbid Cataracts, Anemia, Asthma, Chronic Obstructive Pulmonary Pending Amputation On Presentation History: Disease (COPD), Congestive Heart Failure, Hypertension, Type II Diabetes, End Stage Renal Disease, Osteoarthritis, Neuropathy Photos Photo Uploaded By: Gretta Cool, BSN, RN, CWS, Kim on 08/31/2021 11:40:17 Wound Measurements Length: (cm) 9 Width: (cm) 5 Depth: (cm) 0.9 Area: (cm) 35.343 Volume: (cm) 31.809 % Reduction in Area: 66.7% % Reduction in Volume: 85% Epithelialization: None Wound Description Classification: Grade 4 Exudate Amount: Medium Exudate Type: Serosanguineous Exudate Color: red, brown Foul Odor After Cleansing: No Slough/Fibrino Yes Wound Bed Granulation Amount: Small (1-33%) Exposed Structure Granulation Quality: Red Fat Layer (Subcutaneous Tissue) Exposed: Yes Necrotic Amount: Large (67-100%) Tendon Exposed: Yes Necrotic Quality: Eschar Assessment Notes Toes are black with limited blood flow. Treatment Notes Wound #2 (Foot) Wound Laterality: Right, Circumferential Cleanser Normal Saline Fils, [name] ([id number]) Discharge Instruction: Wash your hands with soap and water. Remove old dressing, discard into plastic bag and place into trash. Cleanse the wound with Normal Saline prior to applying a clean dressing using gauze sponges, not tissues or cotton balls. Do not scrub or use excessive force. Pat dry using gauze sponges, not tissue or  cotton balls. Soap and Water Discharge Instruction: Gently cleanse wound with antibacterial soap, rinse and pat dry prior to dressing wounds Peri-Wound Care Topical Betadine Discharge Instruction: Apply betadine as directed. apply to gauze and pack sites and lay op open areas to top of foot Primary Dressing Gauze Discharge Instruction: As directed: betadine applied Secondary Dressing ABD Pad 5x9 (in/in) Discharge Instruction: Cover with ABD pad Secured With Medipore Tape - 62M Medipore H Soft Cloth Surgical Tape, 2x2 (in/yd) Kerlix Roll Sterile or Non-Sterile 6-ply 4.5x4 (yd/yd) Discharge Instruction: Apply Kerlix as directed Stretch Net Dressing, Latex-free, Size 5, Small-Head / Shoulder / Thigh Compression Wrap Compression Stockings Add-Ons Electronic Signature(s) Signed: 09/04/2021 3:46:53 PM By: Gretta Cool, BSN, RN, CWS, Kim RN, BSN Entered By: Gretta Cool, BSN, RN, CWS, Kim on 08/31/2021 11:28:21 [place], [name] ([id number]) -------------------------------------------------------------------------------- Vitals Details Patient Name: Parks, [name] Date of Service: 08/31/2021 11:15 AM Medical Record Number: [id number] Patient Account Number: [id number] Date of Birth/Sex: 1970/05/06 (51 y.o. F) Treating RN: Cornell Barman Primary Care Mylynn Dinh: Evern Bio Other Clinician: Referring Elyzabeth Goatley: Evern Bio Treating Nuri Branca/Extender: Jeri Cos Weeks in Treatment: 15 Vital Signs Time Taken: 11:20 Temperature (F): 98.3 Height (in): 64 Pulse (bpm): 111 Weight (lbs): 259 Respiratory Rate (breaths/min): 18 Body Mass Index (BMI): 44.5 Blood Pressure (mmHg): 117/75 Reference Range: 80 - 120 mg / dl Airway Inhaled Oxygen Concentration (%): 3 Electronic Signature(s) Signed: 09/04/2021 3:46:53 PM By: Gretta Cool, BSN, RN, CWS, Kim RN, BSN Entered By: Gretta Cool, BSN, RN, CWS, Kim on 08/31/2021 11:21:18

## 2021-08-31 NOTE — Progress Notes (Signed)
PAVIELLE, BIGGAR ([id number]) Visit Report for 08/31/2021 Chief Complaint Document Details Patient Name: Blank, [name] Date of Service: 08/31/2021 11:15 AM Medical Record Number: [id number] Patient Account Number: [id number] Date of Birth/Sex: November 17, 1970 (51 y.o. F) Treating RN: Cornell Barman Primary Care Provider: Evern Bio Other Clinician: Referring Provider: Evern Bio Treating Provider/Extender: Jeri Cos Weeks in Treatment: 15 Information Obtained from: Patient Chief Complaint Right foot gangrene and osteomyelitis Electronic Signature(s) Signed: 08/31/2021 10:58:22 AM By: Worthy Keeler PA-C Entered By: Worthy Keeler on 08/31/2021 10:58:22 [name], [name] ([id number]) -------------------------------------------------------------------------------- Problem List Details Patient Name: [name], [name] Date of Service: 08/31/2021 11:15 AM Medical Record Number: [id number] Patient Account Number: [id number] Date of Birth/Sex: May 17, 1970 (51 y.o. 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

## 2021-09-01 DIAGNOSIS — Z992 Dependence on renal dialysis: Secondary | ICD-10-CM | POA: Diagnosis not present

## 2021-09-01 DIAGNOSIS — N186 End stage renal disease: Secondary | ICD-10-CM | POA: Diagnosis not present

## 2021-09-04 DIAGNOSIS — Z9981 Dependence on supplemental oxygen: Secondary | ICD-10-CM | POA: Diagnosis not present

## 2021-09-04 DIAGNOSIS — L97513 Non-pressure chronic ulcer of other part of right foot with necrosis of muscle: Secondary | ICD-10-CM | POA: Diagnosis not present

## 2021-09-04 DIAGNOSIS — I5032 Chronic diastolic (congestive) heart failure: Secondary | ICD-10-CM | POA: Diagnosis not present

## 2021-09-04 DIAGNOSIS — E1151 Type 2 diabetes mellitus with diabetic peripheral angiopathy without gangrene: Secondary | ICD-10-CM | POA: Diagnosis not present

## 2021-09-04 DIAGNOSIS — Z794 Long term (current) use of insulin: Secondary | ICD-10-CM | POA: Diagnosis not present

## 2021-09-04 DIAGNOSIS — E11621 Type 2 diabetes mellitus with foot ulcer: Secondary | ICD-10-CM | POA: Diagnosis not present

## 2021-09-04 DIAGNOSIS — Z992 Dependence on renal dialysis: Secondary | ICD-10-CM | POA: Diagnosis not present

## 2021-09-04 DIAGNOSIS — D631 Anemia in chronic kidney disease: Secondary | ICD-10-CM | POA: Diagnosis not present

## 2021-09-04 DIAGNOSIS — E1122 Type 2 diabetes mellitus with diabetic chronic kidney disease: Secondary | ICD-10-CM | POA: Diagnosis not present

## 2021-09-04 DIAGNOSIS — I132 Hypertensive heart and chronic kidney disease with heart failure and with stage 5 chronic kidney disease, or end stage renal disease: Secondary | ICD-10-CM | POA: Diagnosis not present

## 2021-09-04 DIAGNOSIS — N186 End stage renal disease: Secondary | ICD-10-CM | POA: Diagnosis not present

## 2021-09-04 NOTE — Progress Notes (Unsigned)
Patient ID: [name], female    DOB: 1970-12-05, 51 y.o.   MRN: [id number]  HPI  [name] is a 51 y/o female with a history of DM, iron deficiency anemia, HTN, COPD with chronic oxygen use, asthma, obesity and chronic heart failure.   Echo report from 11/15/20 reviewed and showed an EF of 60-65% along with mild LVH, severely elevated PA pressure of 78.4 mmHg and mild MR. Echo report from 11/12/16 showed an EF of 55% along with mild MR. Reviewed echo done 06/27/16 which showed an EF of 60-65%. Previous echo was done 12/15/14 and showed an EF of 50-55% without any valvular regurgitation.   Admitted 04/21/21 due to epigastric pain, nausea, vomiting x 4 days along with increased sputum production with cough. Found to have pancreatitis and COPD exacerbation. Treated with analgesia, bowel rest, IV fluids. GI, pulmonology, ID and wound consults obtained. Imaging suggested osteomyelitis of right foot. She underwent biopsy to guide antibiotic therapy and declined all recommendations by several providers to undergo amputation of the foot. She started vancomycin, cefepime and fluconazole prior to discharge based on the culture data and ID recommendations. Discharged after 10 days.    She presents today for a follow-up visit although hasn't been seen since Nov 2019. She presents with a chief complaint of minimal fatigue upon moderate exertion. She describes this as chronic in nature. She has associated cough, shortness of breath, chest pain, difficulty sleeping, weakness and light-headedness along with this. She denies any abdominal distention, palpitations, pedal edema, wheezing or weight gain.   Says that her blood pressure has been dropping early in her dialysis sessions and she's concerned about her circulation around her heart.   Continues with dialysis on T, TH, Sat and is still hoping to get a kidney transplant. She says that she needs to lose more weight before this will be pursued further.   She  takes her metoprolol only if she feels like she needs it for her blood pressure.   Currently has home health nursing coming three times a week to change the dressing on her right foot.   Past Medical History:  Diagnosis Date   (HFpEF) heart failure with preserved ejection fraction ([place])    a. 11/2014 Echo: EF 50-55%, Gr1 DD; b. 06/2016 Echo: EF 60-65%, no rwma, mod dil LA, nl RV, triv eff.   Anxiety    Asthma    Bronchitis    CHF (congestive heart failure) (HCC)    Chronic cough    Chronic respiratory failure with hypoxia and hypercapnia ([place])    a. Home O2.  (2-4L)   CKD (chronic kidney disease), stage III (HCC)    Depression    Diabetes mellitus without complication (HCC)    Diverticulosis    Dyspnea    Dysrhythmia    per pt, no arrythmias   Environmental and seasonal allergies    Extreme obesity with alveolar hypoventilation (HCC)    Hypertension    Iron deficiency anemia    a. chronic blood loss->heavy menses.   Kidney failure    dialysis tues/thur/sat   ... left arm shunt   Orthopnea    Oxygen dependent    3l/min   Pericardial effusion    a. 11/2014 CT Chest: Mod effusion; b. 11/2014 Echo: small to mod circumferential effusion. No hemodynamic compromise.   Pneumonia    history   Poorly controlled diabetes mellitus ([place])    a. 11/2014 A1c 13.2.   Swelling of both lower extremities  Past Surgical History:  Procedure Laterality Date   A/V FISTULAGRAM Left 06/09/2017   Procedure: A/V FISTULAGRAM;  Surgeon: Algernon Huxley, MD;  Location: [facility];  Service: Cardiovascular;  Laterality: Left;   A/V FISTULAGRAM Left 06/16/2017   Procedure: A/V FISTULAGRAM;  Surgeon: Algernon Huxley, MD;  Location: [facility];  Service: Cardiovascular;  Laterality: Left;   A/V FISTULAGRAM Left 01/28/2018   Procedure: A/V FISTULAGRAM;  Surgeon: Algernon Huxley, MD;  Location: [facility];  Service: Cardiovascular;  Laterality: Left;   AMPUTATION TOE Right 10/21/2020    Procedure: AMPUTATION TOE-5th Digit Amputation Right Foot;  Surgeon: Edrick Kins, DPM;  Location: [facility];  Service: Podiatry;  Laterality: Right;   AV FISTULA PLACEMENT Left 04/25/2017   Procedure: ARTERIOVENOUS (AV) FISTULA CREATION;  Surgeon: Algernon Huxley, MD;  Location: [facility];  Service: Vascular;  Laterality: Left;   BONE BIOPSY Right 04/27/2021   Procedure: Right 4th metatarsal BONE BIOPSY;  Surgeon: [name], DPM;  Location: [facility];  Service: Podiatry;  Laterality: Right;   CATARACT EXTRACTION W/PHACO Left 01/21/2018   Procedure: CATARACT EXTRACTION PHACO AND INTRAOCULAR LENS PLACEMENT (IOC);  Surgeon: Leandrew Koyanagi, MD;  Location: [facility];  Service: Ophthalmology;  Laterality: Left;  [place] 01:55.5 CDE 9.05 EAUP 13.9 Fluid Pack Lot # [id number]   CESAREAN SECTION     times 3   DIALYSIS/PERMA CATHETER INSERTION Right 04/04/2017   Procedure: DIALYSIS/PERMA CATHETER INSERTION;  Surgeon: Katha Cabal, MD;  Location: [facility];  Service: Cardiovascular;  Laterality: Right;   DIALYSIS/PERMA CATHETER REMOVAL N/A 09/17/2017   Procedure: DIALYSIS/PERMA CATHETER REMOVAL;  Surgeon: Algernon Huxley, MD;  Location: [facility];  Service: Cardiovascular;  Laterality: N/A;   INCISION AND DRAINAGE Right 10/26/2020   Procedure: INCISION AND DRAINAGE;  Surgeon: [name], DPM;  Location: [facility];  Service: Podiatry;  Laterality: Right;   INSERTION OF DIALYSIS CATHETER N/A 04/16/2017   Procedure: INSERTION OF DIALYSIS PERM CATHETER;  Surgeon: Algernon Huxley, MD;  Location: [facility];  Service: Vascular;  Laterality: N/A;   LOWER EXTREMITY ANGIOGRAPHY Right 10/18/2020   Procedure: Lower Extremity Angiography;  Surgeon: Algernon Huxley, MD;  Location: [facility];  Service: Cardiovascular;  Laterality: Right;   Family History  Problem Relation Age of Onset   Diabetes Mother    Hypertension Mother    Hypertension Father    Diabetes Father    Hypertension Other     Diabetes Other    Breast cancer Maternal Aunt 38   Breast cancer Maternal Aunt 31   Social History   Tobacco Use   Smoking status: Never    Passive exposure: Past   Smokeless tobacco: Never  Substance Use Topics   Alcohol use: Not Currently    Comment: occasional   Allergies  Allergen Reactions   Dust Mite Extract Shortness Of Breath and Itching   Mold Extract [Trichophyton] Shortness Of Breath and Itching   Pollen Extract Shortness Of Breath    Itching, shortness of breath, asthma like symptoms   Sulfa Antibiotics Itching   Feraheme [Ferumoxytol] Itching    Uses benadryl so she can get the medicine   Past Medical History:  Diagnosis Date   (HFpEF) heart failure with preserved ejection fraction ([place])    a. 11/2014 Echo: EF 50-55%, Gr1 DD; b. 06/2016 Echo: EF 60-65%, no rwma, mod dil LA, nl RV, triv eff.   Anxiety  Asthma    Bronchitis    CHF (congestive heart failure) (HCC)    Chronic cough    Chronic respiratory failure with hypoxia and hypercapnia ([place])    a. Home O2.  (2-4L)   CKD (chronic kidney disease), stage III (HCC)    Depression    Diabetes mellitus without complication (HCC)    Diverticulosis    Dyspnea    Dysrhythmia    per pt, no arrythmias   Environmental and seasonal allergies    Extreme obesity with alveolar hypoventilation (HCC)    Hypertension    Iron deficiency anemia    a. chronic blood loss->heavy menses.   Kidney failure    dialysis tues/thur/sat   ... left arm shunt   Orthopnea    Oxygen dependent    3l/min   Pericardial effusion    a. 11/2014 CT Chest: Mod effusion; b. 11/2014 Echo: small to mod circumferential effusion. No hemodynamic compromise.   Pneumonia    history   Poorly controlled diabetes mellitus ([place])    a. 11/2014 A1c 13.2.   Swelling of both lower extremities    Past Surgical History:  Procedure Laterality Date   A/V FISTULAGRAM Left 06/09/2017   Procedure: A/V FISTULAGRAM;  Surgeon: Algernon Huxley, MD;  Location: [facility];  Service: Cardiovascular;  Laterality: Left;   A/V FISTULAGRAM Left 06/16/2017   Procedure: A/V FISTULAGRAM;  Surgeon: Algernon Huxley, MD;  Location: [facility];  Service: Cardiovascular;  Laterality: Left;   A/V FISTULAGRAM Left 01/28/2018   Procedure: A/V FISTULAGRAM;  Surgeon: Algernon Huxley, MD;  Location: [facility];  Service: Cardiovascular;  Laterality: Left;   AMPUTATION TOE Right 10/21/2020   Procedure: AMPUTATION TOE-5th Digit Amputation Right Foot;  Surgeon: Edrick Kins, DPM;  Location: [facility];  Service: Podiatry;  Laterality: Right;   AV FISTULA PLACEMENT Left 04/25/2017   Procedure: ARTERIOVENOUS (AV) FISTULA CREATION;  Surgeon: Algernon Huxley, MD;  Location: [facility];  Service: Vascular;  Laterality: Left;   BONE BIOPSY Right 04/27/2021   Procedure: Right 4th metatarsal BONE BIOPSY;  Surgeon: [name], DPM;  Location: [facility];  Service: Podiatry;  Laterality: Right;   CATARACT EXTRACTION W/PHACO Left 01/21/2018   Procedure: CATARACT EXTRACTION PHACO AND INTRAOCULAR LENS PLACEMENT (IOC);  Surgeon: Leandrew Koyanagi, MD;  Location: [facility];  Service: Ophthalmology;  Laterality: Left;  [place] 01:55.5 CDE 9.05 EAUP 13.9 Fluid Pack Lot # [id number]   CESAREAN SECTION     times 3   DIALYSIS/PERMA CATHETER INSERTION Right 04/04/2017   Procedure: DIALYSIS/PERMA CATHETER INSERTION;  Surgeon: Katha Cabal, MD;  Location: [facility];  Service: Cardiovascular;  Laterality: Right;   DIALYSIS/PERMA CATHETER REMOVAL N/A 09/17/2017   Procedure: DIALYSIS/PERMA CATHETER REMOVAL;  Surgeon: Algernon Huxley, MD;  Location: [facility];  Service: Cardiovascular;  Laterality: N/A;   INCISION AND DRAINAGE Right 10/26/2020   Procedure: INCISION AND DRAINAGE;  Surgeon: [name], DPM;  Location: [facility];  Service: Podiatry;  Laterality: Right;   INSERTION OF DIALYSIS CATHETER N/A 04/16/2017   Procedure: INSERTION OF DIALYSIS PERM CATHETER;   Surgeon: Algernon Huxley, MD;  Location: [facility];  Service: Vascular;  Laterality: N/A;   LOWER EXTREMITY ANGIOGRAPHY Right 10/18/2020   Procedure: Lower Extremity Angiography;  Surgeon: Algernon Huxley, MD;  Location: [facility];  Service: Cardiovascular;  Laterality: Right;   Family History  Problem Relation Age of Onset  Diabetes Mother    Hypertension Mother    Hypertension Father    Diabetes Father    Hypertension Other    Diabetes Other    Breast cancer Maternal Aunt 91   Breast cancer Maternal Aunt 45   Social History   Tobacco Use   Smoking status: Never    Passive exposure: Past   Smokeless tobacco: Never  Substance Use Topics   Alcohol use: Not Currently    Comment: occasional   Allergies  Allergen Reactions   Dust Mite Extract Shortness Of Breath and Itching   Mold Extract [Trichophyton] Shortness Of Breath and Itching   Pollen Extract Shortness Of Breath    Itching, shortness of breath, asthma like symptoms   Sulfa Antibiotics Itching   Feraheme [Ferumoxytol] Itching    Uses benadryl so she can get the medicine   Prior to Admission medications   Medication Sig Start Date End Date Taking? Authorizing Provider  acetaminophen (TYLENOL) 500 MG tablet Take 1,000 mg by mouth every 6 (six) hours as needed for mild pain, fever or headache.   Yes [provider]  albuterol (PROVENTIL) (2.5 MG/3ML) 0.083% nebulizer solution Take 3 mLs (2.5 mg total) by nebulization every 6 (six) hours as needed for wheezing or shortness of breath. 02/28/21  Yes Tyler Pita, MD  albuterol (VENTOLIN HFA) 108 (90 Base) MCG/ACT inhaler Inhale 2 puffs into the lungs every 6 (six) hours as needed for wheezing or shortness of breath. 09/05/21  Yes Tyler Pita, MD  aspirin EC 81 MG tablet Take 81 mg by mouth daily.    Yes [provider]  b complex-vitamin c-folic acid (NEPHRO-VITE) 0.8 MG TABS tablet Take 1 tablet by mouth at bedtime.   Yes [provider]  budesonide (PULMICORT) 0.25 MG/2ML nebulizer solution Take 2 mLs (0.25 mg total) by nebulization 2 (two) times daily. 01/24/21  Yes Tyler Pita, MD  budesonide (PULMICORT) 0.5 MG/2ML nebulizer solution Take by nebulization. 04/17/21  Yes [provider]  Cholecalciferol 100 MCG (4000 UT) CAPS Take 1 capsule by mouth once a week.   Yes [provider]  clindamycin (CLEOCIN T) 1 % lotion Apply 1 application topically daily. 09/19/20  Yes [provider]  ferric citrate (AURYXIA) 1 GM 210 MG(Fe) tablet Take 210 mg by mouth 3 (three) times daily with meals. Only taking once a day most days   Yes [provider]  formoterol (PERFOROMIST) 20 MCG/2ML nebulizer solution  01/19/21  Yes [provider]  insulin degludec (TRESIBA) 100 UNIT/ML FlexTouch Pen Inject 30 Units into the skin daily. 05/01/21  Yes Richarda Osmond, MD  insulin lispro (HUMALOG) 100 UNIT/ML KwikPen Inject into the skin. 06/12/21  Yes [provider]  lactulose (CHRONULAC) 10 GM/15ML solution Take 30 mLs (20 g total) by mouth daily as needed for severe constipation. 04/26/17  Yes Epifanio Lesches, MD  lidocaine-prilocaine (EMLA) cream Apply topically as needed (For access for dialysis).   Yes [provider]  LINZESS 290 MCG CAPS capsule Take 290 mcg by mouth every morning. 10/10/20  Yes [provider]  midodrine (PROAMATINE) 10 MG tablet Take 20 mg by mouth daily as needed. 08/23/21  Yes [provider]  OXYGEN Inhale 2-4 L into the lungs.   Yes [provider]  pregabalin (LYRICA) 50 MG capsule Take 100 mg by mouth every 8 (eight) hours as needed. 08/08/21  Yes [provider]  RENVELA 2.4 g PACK Take 2.4 g by mouth  3 (three) times daily. 08/07/21  Yes [provider]  revefenacin Maretta Bees) 175 MCG/3ML nebulizer solution  01/19/21  Yes [provider]  insulin aspart (NOVOLOG) 100 UNIT/ML injection Inject 0-15  Units into the skin 3 (three) times daily with meals. Patient not taking: Reported on 09/05/2021 04/26/17   Epifanio Lesches, MD  metoprolol tartrate (LOPRESSOR) 50 MG tablet Take 1 tablet (50 mg total) by mouth 2 (two) times daily. Patient not taking: Reported on 09/05/2021 06/18/21   Minna Merritts, MD   Review of Systems  Constitutional:  Positive for fatigue. Negative for appetite change.  HENT:  Positive for congestion. Negative for sinus pressure.   Eyes: Negative.   Respiratory:  Positive for cough (loose cough) and shortness of breath. Negative for chest tightness and wheezing.   Cardiovascular:  Positive for chest pain (at times). Negative for palpitations and leg swelling.  Gastrointestinal:  Negative for abdominal distention and abdominal pain.  Endocrine: Negative.   Genitourinary: Negative.   Musculoskeletal:  Positive for arthralgias (right foot at times). Negative for back pain.  Skin: Negative.   Allergic/Immunologic: Negative.   Neurological:  Positive for weakness and light-headedness (at times). Negative for dizziness.  Hematological:  Negative for adenopathy. Does not bruise/bleed easily.  Psychiatric/Behavioral:  Positive for sleep disturbance (not sleeping well; wearing oxygen at 3L). Negative for dysphoric mood. The patient is not nervous/anxious.    Vitals:   09/05/21 1234  BP: 105/69  Pulse: (!) 110  Resp: 18  SpO2: 97%  Weight: 269 lb 8 oz (122.2 kg)  Height: '5\' 4"'$  (1.626 m)   Wt Readings from Last 3 Encounters:  09/05/21 269 lb 8 oz (122.2 kg)  06/18/21 258 lb (117 kg)  06/06/21 268 lb (121.6 kg)   Lab Results  Component Value Date   CREATININE 10.09 (H) 05/01/2021   CREATININE 8.78 (H) 04/30/2021   CREATININE 9.25 (H) 04/28/2021   Physical Exam Vitals and nursing note reviewed.  Constitutional:      Appearance: She is well-developed.  HENT:     Head: Normocephalic and atraumatic.  Neck:     Vascular: No JVD.  Cardiovascular:     Rate and  Rhythm: Regular rhythm. Tachycardia present.  Pulmonary:     Effort: Pulmonary effort is normal.     Breath sounds: No wheezing, rhonchi or rales.  Abdominal:     General: There is no distension.     Tenderness: There is no abdominal tenderness.  Musculoskeletal:        General: No tenderness.     Cervical back: Normal range of motion and neck supple.  Skin:    General: Skin is warm and dry.  Neurological:     Mental Status: She is alert and oriented to person, place, and time.  Psychiatric:        Behavior: Behavior normal.        Thought Content: Thought content normal.   Assessment & Plan:  1: Chronic heart failure with preserved ejection fraction with structural changes ([facility])- - NYHA class II - patient is weighing daily. Reminded to call for 2 lb weight gain overnight or 5 lb weight gain in a week.  - does do some walking in her home - continues to try eat low sodium diet but admits that it hasn't been easy lately - last saw cardiology Rockey Situ) 06/18/21 - tachycardic today so advised her to resume metoprolol at 1/2 tablet BID - BNP 03/27/17 was 577  2. HTN-  - BP  looks good (105/69) - BMP 05/01/21 shows sodium 128, potassium 5.3, SCr 10.09 & GFR 4 - dialysis Tuesday, Thursday, Saturday  3: Diabetes-  - nonfasting glucose at home this morning was 190 - A1c 04/22/21 was 6.2%  4: Wound- - went to wound clinic 08/31/21 - has home health nursing 3 times/ week - saw ID Delaine Lame) 06/12/21  5 Obstructive sleep apnea- - saw pulmonology Patsey Berthold) 06/06/21 - trying to wear bipap but reports she has great difficulty in trying to tolerate it - is going to try and get a different mask ordered - wearing oxygen at 3L around the clock   Medication list reviewed.   Return in 6 months, sooner if needed.

## 2021-09-05 ENCOUNTER — Ambulatory Visit: Payer: Medicare Other | Attending: Family | Admitting: Family

## 2021-09-05 ENCOUNTER — Ambulatory Visit: Payer: Medicare Other | Admitting: Family

## 2021-09-05 ENCOUNTER — Other Ambulatory Visit: Payer: Self-pay | Admitting: Pulmonary Disease

## 2021-09-05 ENCOUNTER — Encounter: Payer: Self-pay | Admitting: Family

## 2021-09-05 VITALS — BP 105/69 | HR 110 | Resp 18 | Ht 64.0 in | Wt 269.5 lb

## 2021-09-05 DIAGNOSIS — Z9989 Dependence on other enabling machines and devices: Secondary | ICD-10-CM | POA: Insufficient documentation

## 2021-09-05 DIAGNOSIS — Z992 Dependence on renal dialysis: Secondary | ICD-10-CM | POA: Diagnosis not present

## 2021-09-05 DIAGNOSIS — I10 Essential (primary) hypertension: Secondary | ICD-10-CM

## 2021-09-05 DIAGNOSIS — Z794 Long term (current) use of insulin: Secondary | ICD-10-CM | POA: Diagnosis not present

## 2021-09-05 DIAGNOSIS — N186 End stage renal disease: Secondary | ICD-10-CM | POA: Diagnosis not present

## 2021-09-05 DIAGNOSIS — R42 Dizziness and giddiness: Secondary | ICD-10-CM | POA: Diagnosis not present

## 2021-09-05 DIAGNOSIS — D631 Anemia in chronic kidney disease: Secondary | ICD-10-CM | POA: Diagnosis not present

## 2021-09-05 DIAGNOSIS — R531 Weakness: Secondary | ICD-10-CM | POA: Insufficient documentation

## 2021-09-05 DIAGNOSIS — R000 Tachycardia, unspecified: Secondary | ICD-10-CM | POA: Insufficient documentation

## 2021-09-05 DIAGNOSIS — J441 Chronic obstructive pulmonary disease with (acute) exacerbation: Secondary | ICD-10-CM | POA: Insufficient documentation

## 2021-09-05 DIAGNOSIS — N183 Chronic kidney disease, stage 3 unspecified: Secondary | ICD-10-CM | POA: Diagnosis not present

## 2021-09-05 DIAGNOSIS — I739 Peripheral vascular disease, unspecified: Secondary | ICD-10-CM | POA: Diagnosis not present

## 2021-09-05 DIAGNOSIS — Z9981 Dependence on supplemental oxygen: Secondary | ICD-10-CM | POA: Insufficient documentation

## 2021-09-05 DIAGNOSIS — E1122 Type 2 diabetes mellitus with diabetic chronic kidney disease: Secondary | ICD-10-CM | POA: Insufficient documentation

## 2021-09-05 DIAGNOSIS — G4733 Obstructive sleep apnea (adult) (pediatric): Secondary | ICD-10-CM | POA: Diagnosis not present

## 2021-09-05 DIAGNOSIS — I5032 Chronic diastolic (congestive) heart failure: Secondary | ICD-10-CM

## 2021-09-05 DIAGNOSIS — I132 Hypertensive heart and chronic kidney disease with heart failure and with stage 5 chronic kidney disease, or end stage renal disease: Secondary | ICD-10-CM | POA: Insufficient documentation

## 2021-09-05 DIAGNOSIS — R5383 Other fatigue: Secondary | ICD-10-CM | POA: Insufficient documentation

## 2021-09-05 DIAGNOSIS — E669 Obesity, unspecified: Secondary | ICD-10-CM | POA: Diagnosis not present

## 2021-09-05 DIAGNOSIS — E11621 Type 2 diabetes mellitus with foot ulcer: Secondary | ICD-10-CM | POA: Diagnosis not present

## 2021-09-05 DIAGNOSIS — L97513 Non-pressure chronic ulcer of other part of right foot with necrosis of muscle: Secondary | ICD-10-CM | POA: Diagnosis not present

## 2021-09-05 DIAGNOSIS — E1151 Type 2 diabetes mellitus with diabetic peripheral angiopathy without gangrene: Secondary | ICD-10-CM | POA: Diagnosis not present

## 2021-09-05 MED ORDER — ALBUTEROL SULFATE HFA 108 (90 BASE) MCG/ACT IN AERS: 2.0000 | INHALATION_SPRAY | Freq: Four times a day (QID) | RESPIRATORY_TRACT | 2 refills | Status: DC | PRN

## 2021-09-05 NOTE — Patient Instructions (Addendum)
Continue weighing daily and call for an overnight weight gain of 3 pounds or more or a weekly weight gain of more than 5 pounds.   If you have voicemail, please make sure your mailbox is cleaned out so that we may leave a message and please make sure to listen to any voicemails.     Try taking 1/2 tablet of metoprolol twice daily.

## 2021-09-06 DIAGNOSIS — Z992 Dependence on renal dialysis: Secondary | ICD-10-CM | POA: Diagnosis not present

## 2021-09-06 DIAGNOSIS — N186 End stage renal disease: Secondary | ICD-10-CM | POA: Diagnosis not present

## 2021-09-07 DIAGNOSIS — E1122 Type 2 diabetes mellitus with diabetic chronic kidney disease: Secondary | ICD-10-CM | POA: Diagnosis not present

## 2021-09-07 DIAGNOSIS — E1151 Type 2 diabetes mellitus with diabetic peripheral angiopathy without gangrene: Secondary | ICD-10-CM | POA: Diagnosis not present

## 2021-09-07 DIAGNOSIS — I5032 Chronic diastolic (congestive) heart failure: Secondary | ICD-10-CM | POA: Diagnosis not present

## 2021-09-07 DIAGNOSIS — Z9981 Dependence on supplemental oxygen: Secondary | ICD-10-CM | POA: Diagnosis not present

## 2021-09-07 DIAGNOSIS — D631 Anemia in chronic kidney disease: Secondary | ICD-10-CM | POA: Diagnosis not present

## 2021-09-07 DIAGNOSIS — I132 Hypertensive heart and chronic kidney disease with heart failure and with stage 5 chronic kidney disease, or end stage renal disease: Secondary | ICD-10-CM | POA: Diagnosis not present

## 2021-09-07 DIAGNOSIS — E11621 Type 2 diabetes mellitus with foot ulcer: Secondary | ICD-10-CM | POA: Diagnosis not present

## 2021-09-07 DIAGNOSIS — L97513 Non-pressure chronic ulcer of other part of right foot with necrosis of muscle: Secondary | ICD-10-CM | POA: Diagnosis not present

## 2021-09-07 DIAGNOSIS — N186 End stage renal disease: Secondary | ICD-10-CM | POA: Diagnosis not present

## 2021-09-07 DIAGNOSIS — Z992 Dependence on renal dialysis: Secondary | ICD-10-CM | POA: Diagnosis not present

## 2021-09-07 DIAGNOSIS — Z794 Long term (current) use of insulin: Secondary | ICD-10-CM | POA: Diagnosis not present

## 2021-09-08 DIAGNOSIS — Z992 Dependence on renal dialysis: Secondary | ICD-10-CM | POA: Diagnosis not present

## 2021-09-08 DIAGNOSIS — N186 End stage renal disease: Secondary | ICD-10-CM | POA: Diagnosis not present

## 2021-09-09 DIAGNOSIS — I5032 Chronic diastolic (congestive) heart failure: Secondary | ICD-10-CM | POA: Diagnosis not present

## 2021-09-09 DIAGNOSIS — D631 Anemia in chronic kidney disease: Secondary | ICD-10-CM | POA: Diagnosis not present

## 2021-09-09 DIAGNOSIS — Z992 Dependence on renal dialysis: Secondary | ICD-10-CM | POA: Diagnosis not present

## 2021-09-09 DIAGNOSIS — Z9981 Dependence on supplemental oxygen: Secondary | ICD-10-CM | POA: Diagnosis not present

## 2021-09-09 DIAGNOSIS — L97513 Non-pressure chronic ulcer of other part of right foot with necrosis of muscle: Secondary | ICD-10-CM | POA: Diagnosis not present

## 2021-09-09 DIAGNOSIS — E1151 Type 2 diabetes mellitus with diabetic peripheral angiopathy without gangrene: Secondary | ICD-10-CM | POA: Diagnosis not present

## 2021-09-09 DIAGNOSIS — N186 End stage renal disease: Secondary | ICD-10-CM | POA: Diagnosis not present

## 2021-09-09 DIAGNOSIS — Z794 Long term (current) use of insulin: Secondary | ICD-10-CM | POA: Diagnosis not present

## 2021-09-09 DIAGNOSIS — E1122 Type 2 diabetes mellitus with diabetic chronic kidney disease: Secondary | ICD-10-CM | POA: Diagnosis not present

## 2021-09-09 DIAGNOSIS — E11621 Type 2 diabetes mellitus with foot ulcer: Secondary | ICD-10-CM | POA: Diagnosis not present

## 2021-09-09 DIAGNOSIS — I132 Hypertensive heart and chronic kidney disease with heart failure and with stage 5 chronic kidney disease, or end stage renal disease: Secondary | ICD-10-CM | POA: Diagnosis not present

## 2021-09-10 ENCOUNTER — Ambulatory Visit: Payer: Medicare Other | Admitting: Adult Health

## 2021-09-10 DIAGNOSIS — J449 Chronic obstructive pulmonary disease, unspecified: Secondary | ICD-10-CM | POA: Diagnosis not present

## 2021-09-11 DIAGNOSIS — J9610 Chronic respiratory failure, unspecified whether with hypoxia or hypercapnia: Secondary | ICD-10-CM | POA: Diagnosis not present

## 2021-09-11 DIAGNOSIS — Z992 Dependence on renal dialysis: Secondary | ICD-10-CM | POA: Diagnosis not present

## 2021-09-11 DIAGNOSIS — J449 Chronic obstructive pulmonary disease, unspecified: Secondary | ICD-10-CM | POA: Diagnosis not present

## 2021-09-11 DIAGNOSIS — N186 End stage renal disease: Secondary | ICD-10-CM | POA: Diagnosis not present

## 2021-09-13 DIAGNOSIS — N186 End stage renal disease: Secondary | ICD-10-CM | POA: Diagnosis not present

## 2021-09-13 DIAGNOSIS — Z992 Dependence on renal dialysis: Secondary | ICD-10-CM | POA: Diagnosis not present

## 2021-09-14 DIAGNOSIS — E1122 Type 2 diabetes mellitus with diabetic chronic kidney disease: Secondary | ICD-10-CM | POA: Diagnosis not present

## 2021-09-14 DIAGNOSIS — I132 Hypertensive heart and chronic kidney disease with heart failure and with stage 5 chronic kidney disease, or end stage renal disease: Secondary | ICD-10-CM | POA: Diagnosis not present

## 2021-09-14 DIAGNOSIS — D631 Anemia in chronic kidney disease: Secondary | ICD-10-CM | POA: Diagnosis not present

## 2021-09-14 DIAGNOSIS — E1151 Type 2 diabetes mellitus with diabetic peripheral angiopathy without gangrene: Secondary | ICD-10-CM | POA: Diagnosis not present

## 2021-09-14 DIAGNOSIS — N186 End stage renal disease: Secondary | ICD-10-CM | POA: Diagnosis not present

## 2021-09-14 DIAGNOSIS — L97513 Non-pressure chronic ulcer of other part of right foot with necrosis of muscle: Secondary | ICD-10-CM | POA: Diagnosis not present

## 2021-09-14 DIAGNOSIS — Z9981 Dependence on supplemental oxygen: Secondary | ICD-10-CM | POA: Diagnosis not present

## 2021-09-14 DIAGNOSIS — Z794 Long term (current) use of insulin: Secondary | ICD-10-CM | POA: Diagnosis not present

## 2021-09-14 DIAGNOSIS — I5032 Chronic diastolic (congestive) heart failure: Secondary | ICD-10-CM | POA: Diagnosis not present

## 2021-09-14 DIAGNOSIS — Z992 Dependence on renal dialysis: Secondary | ICD-10-CM | POA: Diagnosis not present

## 2021-09-14 DIAGNOSIS — E11621 Type 2 diabetes mellitus with foot ulcer: Secondary | ICD-10-CM | POA: Diagnosis not present

## 2021-09-15 DIAGNOSIS — N186 End stage renal disease: Secondary | ICD-10-CM | POA: Diagnosis not present

## 2021-09-15 DIAGNOSIS — Z992 Dependence on renal dialysis: Secondary | ICD-10-CM | POA: Diagnosis not present

## 2021-09-17 ENCOUNTER — Ambulatory Visit (INDEPENDENT_AMBULATORY_CARE_PROVIDER_SITE_OTHER): Payer: Medicare Other | Admitting: Pulmonary Disease

## 2021-09-17 ENCOUNTER — Encounter: Payer: Self-pay | Admitting: Pulmonary Disease

## 2021-09-17 VITALS — BP 120/72 | HR 77 | Temp 98.0°F | Ht 64.0 in | Wt 275.6 lb

## 2021-09-17 DIAGNOSIS — E1122 Type 2 diabetes mellitus with diabetic chronic kidney disease: Secondary | ICD-10-CM | POA: Diagnosis not present

## 2021-09-17 DIAGNOSIS — J4550 Severe persistent asthma, uncomplicated: Secondary | ICD-10-CM

## 2021-09-17 DIAGNOSIS — N186 End stage renal disease: Secondary | ICD-10-CM | POA: Diagnosis not present

## 2021-09-17 DIAGNOSIS — I272 Pulmonary hypertension, unspecified: Secondary | ICD-10-CM

## 2021-09-17 DIAGNOSIS — Z9981 Dependence on supplemental oxygen: Secondary | ICD-10-CM | POA: Diagnosis not present

## 2021-09-17 DIAGNOSIS — J449 Chronic obstructive pulmonary disease, unspecified: Secondary | ICD-10-CM | POA: Diagnosis not present

## 2021-09-17 DIAGNOSIS — D631 Anemia in chronic kidney disease: Secondary | ICD-10-CM | POA: Diagnosis not present

## 2021-09-17 DIAGNOSIS — E662 Morbid (severe) obesity with alveolar hypoventilation: Secondary | ICD-10-CM | POA: Diagnosis not present

## 2021-09-17 DIAGNOSIS — Z992 Dependence on renal dialysis: Secondary | ICD-10-CM | POA: Diagnosis not present

## 2021-09-17 DIAGNOSIS — I5033 Acute on chronic diastolic (congestive) heart failure: Secondary | ICD-10-CM

## 2021-09-17 DIAGNOSIS — I5032 Chronic diastolic (congestive) heart failure: Secondary | ICD-10-CM | POA: Diagnosis not present

## 2021-09-17 DIAGNOSIS — J9611 Chronic respiratory failure with hypoxia: Secondary | ICD-10-CM | POA: Diagnosis not present

## 2021-09-17 DIAGNOSIS — G4733 Obstructive sleep apnea (adult) (pediatric): Secondary | ICD-10-CM | POA: Diagnosis not present

## 2021-09-17 DIAGNOSIS — E11621 Type 2 diabetes mellitus with foot ulcer: Secondary | ICD-10-CM | POA: Diagnosis not present

## 2021-09-17 DIAGNOSIS — L97513 Non-pressure chronic ulcer of other part of right foot with necrosis of muscle: Secondary | ICD-10-CM | POA: Diagnosis not present

## 2021-09-17 DIAGNOSIS — I132 Hypertensive heart and chronic kidney disease with heart failure and with stage 5 chronic kidney disease, or end stage renal disease: Secondary | ICD-10-CM | POA: Diagnosis not present

## 2021-09-17 DIAGNOSIS — E1151 Type 2 diabetes mellitus with diabetic peripheral angiopathy without gangrene: Secondary | ICD-10-CM | POA: Diagnosis not present

## 2021-09-17 DIAGNOSIS — Z794 Long term (current) use of insulin: Secondary | ICD-10-CM | POA: Diagnosis not present

## 2021-09-17 MED ORDER — MONTELUKAST SODIUM 10 MG PO TABS: 10.0000 mg | ORAL_TABLET | Freq: Every day | ORAL | 3 refills | Status: DC

## 2021-09-17 NOTE — Progress Notes (Signed)
Subjective:    Patient ID: [name], female    DOB: 12-01-1970, 51 y.o.   MRN: [id number] Patient Care Team: [name], NP as PCP - General (Nurse Practitioner) Minna Merritts, MD as PCP - Cardiology (Cardiology) Minna Merritts, MD as Consulting Physician (Cardiology) Alisa Graff, FNP as Nurse Practitioner (Family Medicine) Tyler Pita, MD as Consulting Physician (Pulmonary Disease)  Chief Complaint  Patient presents with   Follow-up    Not wearing bipap nightly.  SOB with exertion, prod cough with clear to yellow sputum and wheezing.    HPI Patient is a 51 year old lifelong never smoker with end-stage renal disease, obesity with alveolar hypoventilation syndrome and severe persistent asthma with asthmatic bronchitis who presents for follow-up.  This is a scheduled visit.  We last saw her on 06 June 2021.  She has been diagnosed with severe sleep apnea and hypoxemia related to sleep/obese.  She was admitted to [facility] after her initial sleep study in February and was discharged with BiPAP.  However the patient has had poor compliance with the device and ideally should have a titration study which she has been reluctant to have.  She would like to try a different mask.  I have told her that she needs to be compliant with the device otherwise this will be removed.  Today she presents again with confusion with regards to her medicines.  We have handwritten instructions in the past with a chart of how to use the nebulizers but she still gets confused.  We revisited this today.  With regards to her shortness of breath this is at baseline.  He is compliant with oxygen at 3 L/min.  She continues to attend wound care clinic for management of a diabetic ulcer on the right with associated gangrene.  Patient has declined amputation.   Review of Systems A 10 point review of systems was performed and it is as noted above otherwise negative.  Patient Active Problem List    Diagnosis Date Noted   SOB (shortness of breath)    History of osteomyelitis    RUQ pain    Status post thoracentesis    Acute hematogenous osteomyelitis of right foot ([place])    Pancreatitis 82/50/5397   Metabolic acidosis, increased anion gap 04/21/2021   GERD (gastroesophageal reflux disease) 04/21/2021   Acute on chronic diastolic CHF (congestive heart failure) ([place]) 11/14/2020   Anemia of chronic disease    Hyponatremia    Gangrene (HCC)    PVD (peripheral vascular disease) ([place])    Aspiration into airway    Hearing loss of right ear    Diabetic foot ulcer ([place]) 10/15/2020   Decreased pedal pulses 10/15/2020   Chronic respiratory failure with hypoxia ([place])    Extreme obesity with alveolar hypoventilation ([place])    Palliative care by specialist    Goals of care, counseling/discussion    ESRD (end stage renal disease) ([place])    Adjustment disorder with mixed anxiety and depressed mood 03/31/2017   Dysthymia 03/31/2017   Chronic pain syndrome 03/31/2017   Palliative care encounter    Obesity hypoventilation syndrome ([place]) 03/09/2017   Chronic diastolic heart failure ([place]) 01/07/2017   Depression 09/24/2016   Acute on chronic diastolic heart failure ([place]) 07/04/2016   COPD with chronic bronchitis ([place]) 07/04/2016   Acute on chronic respiratory failure ([place]) 06/26/2016   CKD (chronic kidney disease), stage III ([place]) 06/26/2016   COPD exacerbation ([place]) 03/25/2016   Anxiety 10/02/2015  Asthma 08/30/2015   Type 2 diabetes mellitus with ESRD (end-stage renal disease) ([place]) 03/13/2015   Iron deficiency anemia 02/17/2015   Obesity, Class III, BMI 40-49.9 (morbid obesity) ([place])    Shortness of breath    Essential hypertension    Hypertensive heart disease with congestive heart failure ([place]) 12/18/2014   Diabetes mellitus with ESRD (end-stage renal disease) ([place]) 12/15/2014   Social History   Tobacco Use   Smoking status: Never    Passive exposure: Past   Smokeless tobacco:  Never  Substance Use Topics   Alcohol use: Not Currently    Comment: occasional   Allergies  Allergen Reactions   Dust Mite Extract Shortness Of Breath and Itching   Mold Extract [Trichophyton] Shortness Of Breath and Itching   Pollen Extract Shortness Of Breath    Itching, shortness of breath, asthma like symptoms   Sulfa Antibiotics Itching   Feraheme [Ferumoxytol] Itching    Uses benadryl so she can get the medicine   Current Meds  Medication Sig   acetaminophen (TYLENOL) 500 MG tablet Take 1,000 mg by mouth every 6 (six) hours as needed for mild pain, fever or headache.   albuterol (PROVENTIL) (2.5 MG/3ML) 0.083% nebulizer solution Take 3 mLs (2.5 mg total) by nebulization every 6 (six) hours as needed for wheezing or shortness of breath.   albuterol (VENTOLIN HFA) 108 (90 Base) MCG/ACT inhaler Inhale 2 puffs into the lungs every 6 (six) hours as needed for wheezing or shortness of breath.   aspirin EC 81 MG tablet Take 81 mg by mouth daily.    b complex-vitamin c-folic acid (NEPHRO-VITE) 0.8 MG TABS tablet Take 1 tablet by mouth at bedtime.   budesonide (PULMICORT) 0.25 MG/2ML nebulizer solution Take 2 mLs (0.25 mg total) by nebulization 2 (two) times daily.   budesonide (PULMICORT) 0.5 MG/2ML nebulizer solution Take by nebulization.   Cholecalciferol 100 MCG (4000 UT) CAPS Take 1 capsule by mouth once a week.   clindamycin (CLEOCIN T) 1 % lotion Apply 1 application topically daily.   ferric citrate (AURYXIA) 1 GM 210 MG(Fe) tablet Take 210 mg by mouth 3 (three) times daily with meals. Only taking once a day most days   formoterol (PERFOROMIST) 20 MCG/2ML nebulizer solution    insulin degludec (TRESIBA) 100 UNIT/ML FlexTouch Pen Inject 30 Units into the skin daily.   insulin lispro (HUMALOG) 100 UNIT/ML KwikPen Inject into the skin.   lactulose (CHRONULAC) 10 GM/15ML solution Take 30 mLs (20 g total) by mouth daily as needed for severe constipation.   lidocaine-prilocaine (EMLA)  cream Apply topically as needed (For access for dialysis).   LINZESS 290 MCG CAPS capsule Take 290 mcg by mouth every morning.   metoprolol tartrate (LOPRESSOR) 50 MG tablet Take 1 tablet (50 mg total) by mouth 2 (two) times daily.   midodrine (PROAMATINE) 10 MG tablet Take 20 mg by mouth daily as needed.   OXYGEN Inhale 2-4 L into the lungs.   pregabalin (LYRICA) 50 MG capsule Take 100 mg by mouth every 8 (eight) hours as needed.   RENVELA 2.4 g PACK Take 2.4 g by mouth 3 (three) times daily.   revefenacin (YUPELRI) 175 MCG/3ML nebulizer solution    Immunization History  Administered Date(s) Administered   Hepatitis B, adult 09/26/2018   Influenza,inj,Quad PF,6+ Mos 11/14/2016, 01/02/2021   Influenza-Unspecified 11/14/2016, 12/27/2017, 10/16/2018, 11/05/2018   PPD Test 04/15/2017, 05/06/2017, 05/15/2017, 04/28/2018, 04/13/2019, 06/01/2020   Pneumococcal Polysaccharide-23 04/29/2017      Objective:  Physical Exam BP 120/72 (BP Location: Left Arm, Cuff Size: Large)   Pulse 77   Temp 98 F (36.7 C) (Temporal)   Ht '5\' 4"'$  (1.626 m)   Wt 275 lb 9.6 oz (125 kg)   LMP 10/14/2012 (Approximate)   SpO2 97%   BMI 47.31 kg/m   SpO2: 97 % O2 Device: Nasal cannula O2 Flow Rate (L/min): 3 L/min O2 Type: Continuous O2  GENERAL: Obese woman in no acute respiratory distress.  She is sitting in bed without distress.  She is wearing oxygen via nasal cannula at 3 L/min.  HEAD: Normocephalic, atraumatic.  EYES: Pupils equal, round, reactive to light.  No scleral icterus.  MOUTH: Oral mucosa moist no thrush. NECK: Thick, circumference 19 inches, supple. No thyromegaly. Trachea midline. No JVD.  No adenopathy. PULMONARY: Symmetrical air entry.  Coarse breath sounds but no wheezes noted.  Moving air fairly well. CARDIOVASCULAR: S1 and S2. Regular rate and rhythm.  No rubs or gallops heard.  Grade 2/6 systolic ejection murmur radiating to axilla. GASTROINTESTINAL: Obese abdomen,  nondistended. MUSCULOSKELETAL: AV fistula left upper extremity with good thrill.  She has a gangrenous right foot,it is wrapped. This was not exposed for examination. NEUROLOGIC: Awake, alert, no overt focal deficits.  Speech is fluent.  Gait not assessed. SKIN: Intact,warm,dry.  No overt rashes noted. PSYCH: Mood and behavior appropriate.  Insight: Poor      Assessment & Plan:     ICD-10-CM   1. Severe persistent asthmatic bronchitis without complication  C16.60    Continue Perforomist, Pulmicort and Yupelri Continue as needed albuterol Add Singulair 10 mg daily    2. Extreme obesity with alveolar hypoventilation (HCC)  E66.2    Weight loss recommended CPAP will be ineffective in this instance Needs to be compliant with BiPAP    3. OSA (obstructive sleep apnea)  G47.33 AMB REFERRAL FOR DME   Your obstructive sleep apnea by prior sleep study in February    4. Chronic respiratory failure with hypoxia (HCC)  J96.11    Continue oxygen at 3 L/min Patient compliant with therapy    5. Pulmonary hypertension (HCC)  I27.20 ECHOCARDIOGRAM COMPLETE   Multifactorial: Diastolic dysfunction, extreme obesity, OSA Reassess with 2D echo    6. Acute on chronic diastolic CHF (congestive heart failure) (HCC)  I50.33 ECHOCARDIOGRAM COMPLETE   This issue adds complexity to her management Follows with cardiology Volume management challenge due to ESRD    7. ESRD (end stage renal disease) on dialysis ([place])  N18.6    Z99.2    Tissue adds complexity to her management On dialysis 3 times weekly Follows with renal     Orders Placed This Encounter  Procedures   AMB REFERRAL FOR DME    Referral Priority:   Routine    Referral Type:   Durable Medical Equipment Purchase    Number of Visits Requested:   1   ECHOCARDIOGRAM COMPLETE    Next available.    Standing Status:   Future    Number of Occurrences:   1    Standing Expiration Date:   03/20/2022    Order Specific Question:   Where should  this test be performed    Answer:   [facility]    Order Specific Question:   Perflutren DEFINITY (image enhancing agent) should be administered unless hypersensitivity or allergy exist    Answer:   Administer Perflutren    Order Specific Question:   Reason for exam-Echo    Answer:  Pulmonary hypertension I27.2   Meds ordered this encounter  Medications   montelukast (SINGULAIR) 10 MG tablet    Sig: Take 1 tablet (10 mg total) by mouth at bedtime.    Dispense:  30 tablet    Refill:  3   We will see the patient in follow-up in 2 to 3 months time she is to contact us prior to that time should any new problems arise.  Renold Don, MD Advanced Bronchoscopy PCCM [facility]    *This note was dictated using voice recognition software/Dragon.  Despite best efforts to proofread, errors can occur which can change the meaning. Any transcriptional errors that result from this process are unintentional and may not be fully corrected at the time of dictation.

## 2021-09-17 NOTE — Patient Instructions (Signed)
We have sent in a prescription for Singulair which is 1 tablet daily.  To see if this helps with your cough and your nasal symptoms.  Continue using your nebulizers.  We have sent in a prescription for your nasal pillows mask.  We will see you in follow-up in 2 to 3 months time call sooner should any new problems arise.

## 2021-09-18 DIAGNOSIS — N186 End stage renal disease: Secondary | ICD-10-CM | POA: Diagnosis not present

## 2021-09-18 DIAGNOSIS — Z992 Dependence on renal dialysis: Secondary | ICD-10-CM | POA: Diagnosis not present

## 2021-09-19 ENCOUNTER — Ambulatory Visit (INDEPENDENT_AMBULATORY_CARE_PROVIDER_SITE_OTHER): Payer: Medicare Other

## 2021-09-19 DIAGNOSIS — I132 Hypertensive heart and chronic kidney disease with heart failure and with stage 5 chronic kidney disease, or end stage renal disease: Secondary | ICD-10-CM | POA: Diagnosis not present

## 2021-09-19 DIAGNOSIS — E1151 Type 2 diabetes mellitus with diabetic peripheral angiopathy without gangrene: Secondary | ICD-10-CM | POA: Diagnosis not present

## 2021-09-19 DIAGNOSIS — N186 End stage renal disease: Secondary | ICD-10-CM | POA: Diagnosis not present

## 2021-09-19 DIAGNOSIS — Z794 Long term (current) use of insulin: Secondary | ICD-10-CM | POA: Diagnosis not present

## 2021-09-19 DIAGNOSIS — E11621 Type 2 diabetes mellitus with foot ulcer: Secondary | ICD-10-CM | POA: Diagnosis not present

## 2021-09-19 DIAGNOSIS — I5032 Chronic diastolic (congestive) heart failure: Secondary | ICD-10-CM | POA: Diagnosis not present

## 2021-09-19 DIAGNOSIS — I272 Pulmonary hypertension, unspecified: Secondary | ICD-10-CM | POA: Diagnosis not present

## 2021-09-19 DIAGNOSIS — D631 Anemia in chronic kidney disease: Secondary | ICD-10-CM | POA: Diagnosis not present

## 2021-09-19 DIAGNOSIS — I5033 Acute on chronic diastolic (congestive) heart failure: Secondary | ICD-10-CM

## 2021-09-19 DIAGNOSIS — E1122 Type 2 diabetes mellitus with diabetic chronic kidney disease: Secondary | ICD-10-CM | POA: Diagnosis not present

## 2021-09-19 DIAGNOSIS — L97513 Non-pressure chronic ulcer of other part of right foot with necrosis of muscle: Secondary | ICD-10-CM | POA: Diagnosis not present

## 2021-09-19 DIAGNOSIS — Z9981 Dependence on supplemental oxygen: Secondary | ICD-10-CM | POA: Diagnosis not present

## 2021-09-19 DIAGNOSIS — Z992 Dependence on renal dialysis: Secondary | ICD-10-CM | POA: Diagnosis not present

## 2021-09-19 MED ORDER — PERFLUTREN LIPID MICROSPHERE
1.0000 mL | INTRAVENOUS | Status: AC | PRN
  Administered 2021-09-19: 2 mL via INTRAVENOUS

## 2021-09-20 DIAGNOSIS — N186 End stage renal disease: Secondary | ICD-10-CM | POA: Diagnosis not present

## 2021-09-20 DIAGNOSIS — Z992 Dependence on renal dialysis: Secondary | ICD-10-CM | POA: Diagnosis not present

## 2021-09-20 LAB — ECHOCARDIOGRAM COMPLETE
AV Mean grad: 7 mmHg
AV Peak grad: 12.7 mmHg
Ao pk vel: 1.78 m/s
Area-P 1/2: 0.71 cm2

## 2021-09-22 DIAGNOSIS — Z992 Dependence on renal dialysis: Secondary | ICD-10-CM | POA: Diagnosis not present

## 2021-09-22 DIAGNOSIS — N186 End stage renal disease: Secondary | ICD-10-CM | POA: Diagnosis not present

## 2021-09-24 DIAGNOSIS — N186 End stage renal disease: Secondary | ICD-10-CM | POA: Diagnosis not present

## 2021-09-24 DIAGNOSIS — I132 Hypertensive heart and chronic kidney disease with heart failure and with stage 5 chronic kidney disease, or end stage renal disease: Secondary | ICD-10-CM | POA: Diagnosis not present

## 2021-09-24 DIAGNOSIS — Z9981 Dependence on supplemental oxygen: Secondary | ICD-10-CM | POA: Diagnosis not present

## 2021-09-24 DIAGNOSIS — E1122 Type 2 diabetes mellitus with diabetic chronic kidney disease: Secondary | ICD-10-CM | POA: Diagnosis not present

## 2021-09-24 DIAGNOSIS — Z794 Long term (current) use of insulin: Secondary | ICD-10-CM | POA: Diagnosis not present

## 2021-09-24 DIAGNOSIS — L97513 Non-pressure chronic ulcer of other part of right foot with necrosis of muscle: Secondary | ICD-10-CM | POA: Diagnosis not present

## 2021-09-24 DIAGNOSIS — E11621 Type 2 diabetes mellitus with foot ulcer: Secondary | ICD-10-CM | POA: Diagnosis not present

## 2021-09-24 DIAGNOSIS — E1151 Type 2 diabetes mellitus with diabetic peripheral angiopathy without gangrene: Secondary | ICD-10-CM | POA: Diagnosis not present

## 2021-09-24 DIAGNOSIS — Z992 Dependence on renal dialysis: Secondary | ICD-10-CM | POA: Diagnosis not present

## 2021-09-24 DIAGNOSIS — D631 Anemia in chronic kidney disease: Secondary | ICD-10-CM | POA: Diagnosis not present

## 2021-09-24 DIAGNOSIS — I5032 Chronic diastolic (congestive) heart failure: Secondary | ICD-10-CM | POA: Diagnosis not present

## 2021-09-25 DIAGNOSIS — J45909 Unspecified asthma, uncomplicated: Secondary | ICD-10-CM | POA: Diagnosis not present

## 2021-09-25 DIAGNOSIS — N186 End stage renal disease: Secondary | ICD-10-CM | POA: Diagnosis not present

## 2021-09-25 DIAGNOSIS — Z992 Dependence on renal dialysis: Secondary | ICD-10-CM | POA: Diagnosis not present

## 2021-09-25 DIAGNOSIS — I5040 Unspecified combined systolic (congestive) and diastolic (congestive) heart failure: Secondary | ICD-10-CM | POA: Diagnosis not present

## 2021-09-26 DIAGNOSIS — L97513 Non-pressure chronic ulcer of other part of right foot with necrosis of muscle: Secondary | ICD-10-CM | POA: Diagnosis not present

## 2021-09-26 DIAGNOSIS — Z794 Long term (current) use of insulin: Secondary | ICD-10-CM | POA: Diagnosis not present

## 2021-09-26 DIAGNOSIS — I5032 Chronic diastolic (congestive) heart failure: Secondary | ICD-10-CM | POA: Diagnosis not present

## 2021-09-26 DIAGNOSIS — I132 Hypertensive heart and chronic kidney disease with heart failure and with stage 5 chronic kidney disease, or end stage renal disease: Secondary | ICD-10-CM | POA: Diagnosis not present

## 2021-09-26 DIAGNOSIS — E1122 Type 2 diabetes mellitus with diabetic chronic kidney disease: Secondary | ICD-10-CM | POA: Diagnosis not present

## 2021-09-26 DIAGNOSIS — D631 Anemia in chronic kidney disease: Secondary | ICD-10-CM | POA: Diagnosis not present

## 2021-09-26 DIAGNOSIS — E1151 Type 2 diabetes mellitus with diabetic peripheral angiopathy without gangrene: Secondary | ICD-10-CM | POA: Diagnosis not present

## 2021-09-26 DIAGNOSIS — E11621 Type 2 diabetes mellitus with foot ulcer: Secondary | ICD-10-CM | POA: Diagnosis not present

## 2021-09-26 DIAGNOSIS — Z992 Dependence on renal dialysis: Secondary | ICD-10-CM | POA: Diagnosis not present

## 2021-09-26 DIAGNOSIS — N186 End stage renal disease: Secondary | ICD-10-CM | POA: Diagnosis not present

## 2021-09-26 DIAGNOSIS — Z9981 Dependence on supplemental oxygen: Secondary | ICD-10-CM | POA: Diagnosis not present

## 2021-09-27 DIAGNOSIS — Z992 Dependence on renal dialysis: Secondary | ICD-10-CM | POA: Diagnosis not present

## 2021-09-27 DIAGNOSIS — N186 End stage renal disease: Secondary | ICD-10-CM | POA: Diagnosis not present

## 2021-09-28 ENCOUNTER — Encounter: Payer: Medicare Other | Attending: Physician Assistant | Admitting: Physician Assistant

## 2021-09-28 DIAGNOSIS — E1140 Type 2 diabetes mellitus with diabetic neuropathy, unspecified: Secondary | ICD-10-CM | POA: Insufficient documentation

## 2021-09-28 DIAGNOSIS — I5042 Chronic combined systolic (congestive) and diastolic (congestive) heart failure: Secondary | ICD-10-CM | POA: Insufficient documentation

## 2021-09-28 DIAGNOSIS — E11621 Type 2 diabetes mellitus with foot ulcer: Secondary | ICD-10-CM | POA: Insufficient documentation

## 2021-09-28 DIAGNOSIS — M1990 Unspecified osteoarthritis, unspecified site: Secondary | ICD-10-CM | POA: Diagnosis not present

## 2021-09-28 DIAGNOSIS — L97514 Non-pressure chronic ulcer of other part of right foot with necrosis of bone: Secondary | ICD-10-CM | POA: Insufficient documentation

## 2021-09-28 DIAGNOSIS — Z992 Dependence on renal dialysis: Secondary | ICD-10-CM | POA: Diagnosis not present

## 2021-09-28 DIAGNOSIS — N186 End stage renal disease: Secondary | ICD-10-CM | POA: Diagnosis not present

## 2021-09-28 DIAGNOSIS — L97513 Non-pressure chronic ulcer of other part of right foot with necrosis of muscle: Secondary | ICD-10-CM | POA: Diagnosis not present

## 2021-09-28 DIAGNOSIS — Z9981 Dependence on supplemental oxygen: Secondary | ICD-10-CM | POA: Insufficient documentation

## 2021-09-28 DIAGNOSIS — J449 Chronic obstructive pulmonary disease, unspecified: Secondary | ICD-10-CM | POA: Insufficient documentation

## 2021-09-28 DIAGNOSIS — E1122 Type 2 diabetes mellitus with diabetic chronic kidney disease: Secondary | ICD-10-CM | POA: Diagnosis not present

## 2021-09-28 DIAGNOSIS — M86371 Chronic multifocal osteomyelitis, right ankle and foot: Secondary | ICD-10-CM | POA: Diagnosis not present

## 2021-09-28 DIAGNOSIS — I132 Hypertensive heart and chronic kidney disease with heart failure and with stage 5 chronic kidney disease, or end stage renal disease: Secondary | ICD-10-CM | POA: Diagnosis not present

## 2021-09-28 NOTE — Progress Notes (Addendum)
BRIAR, SWORD ([id number]) Visit Report for 09/28/2021 Chief Complaint Document Details Patient Name: [name], [name] Date of Service: 09/28/2021 10:30 AM Medical Record Number: [id number] Patient Account Number: [id number] Date of Birth/Sex: 01/27/1971 (51 y.o. F) Treating RN: Cornell Barman Primary Care Provider: Evern Bio Other Clinician: Massie Kluver Referring Provider: Evern Bio Treating Provider/Extender: Jeri Cos Weeks in Treatment: 19 Information Obtained from: Patient Chief Complaint Right foot gangrene and osteomyelitis Electronic Signature(s) Signed: 09/28/2021 10:40:41 AM By: Worthy Keeler PA-C Entered By: Worthy Keeler on 09/28/2021 10:40:40 [name], [name] ([id number]) -------------------------------------------------------------------------------- Debridement Details Patient Name: Leis, [name] Date of Service: 09/28/2021 10:30 AM Medical Record Number: [id number] Patient Account Number: [id number] Date of Birth/Sex: [birth year] (51 y.o. F) Treating RN: Cornell Barman Primary Care Provider: Evern Bio Other Clinician: Massie Kluver Referring Provider: Evern Bio Treating Provider/Extender: Skipper Cliche in Treatment: 19 Debridement Performed for Wound #2 Right,Circumferential Foot Assessment: Performed By: Physician Tommie Sams., PA-C Debridement Type: Debridement Severity of Tissue Pre Debridement: Necrosis of muscle Level of Consciousness (Pre- Awake and Alert procedure): Pre-procedure Verification/Time Out Yes - 11:34 Taken: Start Time: 11:34 Total Area Debrided (L x W): 4.2 (cm) x 5.3 (cm) = 22.26 (cm) Tissue and other material Viable, Non-Viable, Callus, Eschar, Slough, Subcutaneous, Slough debrided: Level: Skin/Subcutaneous Tissue Debridement Description: Excisional Instrument: Curette, Scissors Bleeding: Minimum Hemostasis Achieved: Pressure End Time: 11:43 Response to Treatment: Procedure was tolerated well Level  of Consciousness (Post- Awake and Alert procedure): Post Debridement Measurements of Total Wound Length: (cm) 9 Width: (cm) 9 Depth: (cm) 0.5 Volume: (cm) 31.809 Character of Wound/Ulcer Post Debridement: Stable Severity of Tissue Post Debridement: Necrosis of muscle Post Procedure Diagnosis Same as Pre-procedure Electronic Signature(s) Signed: 09/28/2021 3:51:00 PM By: Worthy Keeler PA-C Signed: 09/28/2021 4:32:12 PM By: Gretta Cool, BSN, RN, CWS, Kim RN, BSN Previous Signature: 09/28/2021 3:39:08 PM Version By: Gretta Cool, BSN, RN, CWS, Kim RN, BSN Entered By: Worthy Keeler on 09/28/2021 15:51:00 [name], [name] ([id number]) -------------------------------------------------------------------------------- HPI Details Patient Name: [name], [name] Date of Service: 09/28/2021 10:30 AM Medical Record Number: [id number] Patient Account Number: [id number] Date of Birth/Sex: [birth year] (51 y.o. F) Treating RN: Cornell Barman Primary Care Provider: Evern Bio Other Clinician: Massie Kluver Referring Provider: Evern Bio Treating Provider/Extender: Skipper Cliche in Treatment: 19 History of Present Illness HPI Description: 10/04/2020 upon evaluation today patient presents for initial inspection here in the clinic concerning a fairly concerning wound over her right fifth toe towards the medial aspect which actually probes all the way down to bone. There is evidence of necrotic muscle as well and I do see tendon exposed as well. Subsequently this does have me very concerned about where things stand here. Again this is the first time that I am seeing her and on top of everything else her ABIs are noncompressible. I think that arterial flow is the primary concern here. Of note the patient states this was first noted July 1. She had a x-ray on July 4 which was negative for bone infection. She has been on 2 antibiotics she is unsure of the name. With that being said her most recent hemoglobin A1c  was 8.1 that was December 2022. She otherwise does have a history obviously of diabetes mellitus type 2, hypertension, end-stage renal disease with dependence on renal dialysis, and she is also dependent on supplemental oxygen. Readmission: 05/14/2021 this is a patient whom I previously seen October 04, 2020. With that being said she is subsequently on 21 October 2020 ended up having an amputation of her fifth digit of her foot unfortunately. This had progressed fairly quickly into what sounds to been gangrene at that time. Subsequently right now based on what I am seeing the patient actually has a necrotic area on the end of her foot and has been recommended that the only option really here is going to be a amputation which would be a below-knee amputation. She is really not interested in that and wants to try to give this every chance it can to heal. For that reason I discussed with her that definitely we can see what we can do to try to improve this situation and try to help get this doing better as best we can. With that being said there is definitely a portion of her wound that is just not good to be able to heal no matter what it is already starting to auto amputate. This includes potentially digits of her foot as well based on what I am seeing. She does have confirmed osteomyelitis noted by MRI. She is also currently on IV vancomycin. She does undergo dialysis on Tuesday, Thursday, and Saturday. The patient does have a history of diabetes mellitus type 2, congestive heart failure, hypertension, end-stage renal disease, dependence on renal dialysis, and dependence on supplemental oxygen. With that being said I am going to need to look into her heart failure and the significance of this as far as hyperbarics is concerned before we initiate treatment. She is probably also going to need an updated EKG and chest x-ray unless its already been done recently. 05/28/2021 upon evaluation today patient  appears to be doing well with regard to her wound all things considered. I am going to try to see what we can do about trimmed away some of the necrotic tissue that is really just hanging on here and there and not really helpful. Feel like if we can slowly start doing this maybe we will be able to get to the point where this is better and better as far as cleaning up the surface of the wound is concerned. Still as I explained I do believe some of her toes are not salvageable but again she is looking more towards autoamputation versus want to do anything definitive here from a surgical standpoint. I have been researching whether she would be an okay candidate for hyperbarics or not and I found that her x-ray but she had a chest x-ray recently was negative for any acute disease and appear to be stable. EKG was also stable. She did also have a cardiac echo which showed that her ejection fraction was 60 to 65% and therefore I think she is a candidate for hyperbarics. Again I do believe that this can help with preventing amputation which right now she has been told that her only option would be a below-knee amputation. Nonetheless I think that if we can get the heel leading to spread and the majority of this cleared away that it is possible we might even be able to get a surgeon just to surgically remove the nonviable toes and not have to go the full way of complete amputation. Nonetheless we do have a ways to go before we will get to that point. Either way I think the hyperbarics could be an excellent support as far as that is concerned. 06-05-2021 upon evaluation today patient's wound is showing some signs of improvement. This does seem to be slow and of course it is understandable considering what  we are seeing. She has discussed and looked up information about hyperbarics and in the end comes back today and states that she does want to proceed. I honestly do feel like that is her best chance at saving  the foot. Even if we are able to get her into a surgeon after we obtain some good healing approximately so that there is not as much necrotic tissue in general she may even be able to have a transmetatarsal amputation and get this to heal. Again I am not saying this is can be an easy road but it may be her best road as far as thinking about how to achieve some type of complete healing as I do not believe the toes are viable at all to be honest. 07-06-2021 upon evaluation today patient appears to be doing well currently in regard to her foot all things considered. Again she unfortunately was unable to do the hyperbaric oxygen therapy this was due to her claustrophobia. Nonetheless she tells me that she is pleased with how the foot appears and she does feel like there is good improvement going on here. In general I think that we are on the right track. If she is going to take a very long time and to be honest the route of autoamputation which is pretty much the way this is going is not a quick or easy road but nonetheless she seems to be taking care of her foot quite well. She is in good spirits. 07-27-2021 upon evaluation today patient appears to be doing well currently in regard to her foot all things considered. A lot of the eschar is starting to lift up around the edges which is good there is still significant necrotic tissue on the distal portion of the foot. This is slowly going to work its way off. Based on what I am seeing currently I do not believe that there is any evidence of infection which is good news. 08-31-2021 upon evaluation today patient's wound actually appears to be doing decently well. I do not see anything in particular to try to trim away today. With that being said she continues to slowly allow this to do what is going to do as far as autoamputation is concerned. I did have another conversation with her today about the possibility of seeing a surgeon for a transmetatarsal amputation  which again would help to get this to close faster and obviously I think would be a much speedy year and safer way to proceed with amputation. With that being said she tells me that she would really prefer to just let this go at "God pace". 09-28-2021 upon evaluation today patient's foot actually appears to be doing much better. Fortunately I do not see any signs of infection which is Mcpherson, Daisi M. ([id number]) great news and overall I am extremely pleased with where we stand today. There does not appear to be any signs of active infection locally or systemically which is great news. No fevers, chills, nausea, vomiting, or diarrhea. Electronic Signature(s) Signed: 09/28/2021 3:50:12 PM By: Worthy Keeler PA-C Entered By: Worthy Keeler on 09/28/2021 15:50:12 [name], [name] ([id number]) -------------------------------------------------------------------------------- Physical Exam Details Patient Name: [name], [name] Date of Service: 09/28/2021 10:30 AM Medical Record Number: [id number] Patient Account Number: [id number] Date of Birth/Sex: 1970/12/20 (51 y.o. F) Treating RN: Cornell Barman Primary Care Provider: Evern Bio Other Clinician: Massie Kluver Referring Provider: Evern Bio Treating Provider/Extender: Jeri Cos Weeks in Treatment: 19 Constitutional Obese and well-hydrated in  no acute distress. Respiratory normal breathing without difficulty. Psychiatric this patient is able to make decisions and demonstrates good insight into disease process. Alert and Oriented x 3. pleasant and cooperative. Notes Upon inspection patient's wound bed showed evidence of good granulation and epithelization at this point. Fortunately I do not see any signs of infection I did actually perform debridement of clearway some of the callus and eschar as well as some of the slough and biofilm over the portion of the wound that was more proximal including some necrotic tendon/muscle. The patient  tolerated this debridement today without complication and postdebridement the wound bed appears to be doing much better which is great news. Overall I think that we are on the right track more carefully removing what we can and cleaning up the surface of the wounds that are healthy all in good time trying to get this to heal and fill it appropriately so that we can hopefully get this completely closed for her. Again we know that the end of the foot at least a portion of this is no longer viable and eventually is going to self amputate we are just continuing to remove what we can as far as necrotic tissue is concerned until we get to that point so that hopefully in the end this will be able to heal on its own she still does not want any surgical intervention she wants to let this proceed as God would like it. Electronic Signature(s) Signed: 09/28/2021 3:51:47 PM By: Worthy Keeler PA-C Entered By: Worthy Keeler on 09/28/2021 15:51:46 [name], [name] ([id number]) -------------------------------------------------------------------------------- Physician Orders Details Patient Name: [name], [name] Date of Service: 09/28/2021 10:30 AM Medical Record Number: [id number] Patient Account Number: [id number] Date of Birth/Sex: 1970/07/11 (51 y.o. F) Treating RN: Cornell Barman Primary Care Provider: Evern Bio Other Clinician: Massie Kluver Referring Provider: Evern Bio Treating Provider/Extender: Skipper Cliche in Treatment: 19 Verbal / Phone Orders: No Diagnosis Coding ICD-10 Coding Code Description M86.371 Chronic multifocal osteomyelitis, right ankle and foot E11.621 Type 2 diabetes mellitus with foot ulcer L97.514 Non-pressure chronic ulcer of other part of right foot with necrosis of bone I50.42 Chronic combined systolic (congestive) and diastolic (congestive) heart failure I10 Essential (primary) hypertension N18.6 End stage renal disease Z99.2 Dependence on renal  dialysis Z99.81 Dependence on supplemental oxygen Follow-up Appointments o Return Appointment in 3 weeks. o Nurse Visit as needed [place] for wound care. May utilize formulary equivalent dressing for wound treatment orders unless otherwise specified. Home Health Nurse may visit [facility] to address patientos wound care needs. o Scheduled days for dressing changes to be completed; exception, patient has scheduled wound care visit that day. o **Please direct any NON-WOUND related issues/requests for orders to patient's Primary Care Physician. **If current dressing causes regression in wound condition, may D/C ordered dressing product/s and apply Normal Saline Moist Dressing daily until next [place] or Other MD appointment. **Notify [facility] of regression in wound condition at [phone]. Bathing/ Shower/ Hygiene o Clean wound with Normal Saline or wound cleanser. o Wash wounds with antibacterial soap and water. o May shower; gently cleanse wound with antibacterial soap, rinse and pat dry prior to dressing wounds o No tub bath. Off-Loading o Open toe surgical shoe - provided at visit Hyperbaric Oxygen Therapy o Evaluate for HBO Therapy o Indication and location: - right foot o If appropriate for treatment, begin HBOT per protocol: o 2.5  ATA for 90 Minutes with 2 Five (5) Minute Air Breaks o One treatment per day (delivered Monday through Friday unless otherwise specified in Special Instructions below): o Total # of Treatments: - 40 o Finger stick Blood Glucose Pre- and Post- HBOT Treatment. o Follow Hyperbaric Oxygen Glycemia Protocol Wound Treatment Wound #2 - Foot Wound Laterality: Right, Circumferential Cleanser: Normal Saline 1 x Per Day/30 Days Discharge Instructions: Wash your hands with soap and water. Remove old dressing, discard into plastic bag and place into  trash. Cleanse the wound with Normal Saline prior to applying a clean dressing using gauze sponges, not tissues or cotton balls. Do not scrub or use excessive force. Pat dry using gauze sponges, not tissue or cotton balls. Cleanser: Soap and Water 1 x Per Day/30 Days Discharge Instructions: Gently cleanse wound with antibacterial soap, rinse and pat dry prior to dressing wounds Topical: Betadine 1 x Per Day/30 Days [name], [name] ([id number]) Discharge Instructions: Apply betadine as directed. apply to gauze and pack sites and lay op open areas to top of foot Primary Dressing: Gauze 1 x Per Day/30 Days Discharge Instructions: As directed: betadine applied Secondary Dressing: ABD Pad 5x9 (in/in) 1 x Per Day/30 Days Discharge Instructions: Cover with ABD pad Secured With: Medipore Tape - 43M Medipore H Soft Cloth Surgical Tape, 2x2 (in/yd) 1 x Per Day/30 Days Secured With: Kerlix Roll Sterile or Non-Sterile 6-ply 4.5x4 (yd/yd) 1 x Per Day/30 Days Discharge Instructions: Apply Kerlix as directed Secured With: [employer] Dressing, Latex-free, Size 5, Small-Head / Shoulder / Thigh 1 x Per Day/30 Days GLYCEMIA INTERVENTIONS PROTOCOL PRE-HBO GLYCEMIA INTERVENTIONS ACTION INTERVENTION Obtain pre-HBO capillary blood glucose (ensure 1 physician order is in chart). A. Notify HBO physician and await physician orders. 2 If result is 70 mg/dl or below: B. If the result meets the hospital definition of a critical result, follow hospital policy. A. Give patient an 8 ounce Glucerna Shake, an 8 ounce Ensure, or 8 ounces of a Glucerna/Ensure equivalent dietary supplement*. B. Wait 30 minutes. If result is 71 mg/dl to 130 mg/dl: C. Retest patientos capillary blood glucose (CBG). D. If result greater than or equal to 110 mg/dl, proceed with HBO. If result less than 110 mg/dl, notify HBO physician and consider holding HBO. If result is 131 mg/dl to 249 mg/dl: A. Proceed with HBO. A. Notify HBO  physician and await physician orders. B. It is recommended to hold HBO and do If result is 250 mg/dl or greater: blood/urine ketone testing. C. If the result meets the hospital definition of a critical result, follow hospital policy. POST-HBO GLYCEMIA INTERVENTIONS ACTION INTERVENTION Obtain post HBO capillary blood glucose (ensure 1 physician order is in chart). A. Notify HBO physician and await physician orders. 2 If result is 70 mg/dl or below: B. If the result meets the hospital definition of a critical result, follow hospital policy. A. Give patient an 8 ounce Glucerna Shake, an 8 ounce Ensure, or 8 ounces of a Glucerna/Ensure equivalent dietary supplement*. B. Wait 15 minutes for symptoms of hypoglycemia (i.e. nervousness, anxiety, If result is 71 mg/dl to 100 mg/dl: sweating, chills, clamminess, irritability, confusion, tachycardia or dizziness). C. If patient asymptomatic, discharge patient. If patient symptomatic, repeat capillary blood glucose (CBG) and notify HBO physician. If result is 101 mg/dl to 249 mg/dl: A. Discharge patient. A. Notify HBO physician and await physician orders. B. It is recommended to do blood/urine If result is 250 mg/dl or greater: ketone testing. C. If the result meets the  hospital definition of a critical result, follow hospital policy. *Juice or candies are NOT equivalent products. If patient refuses the Glucerna or Ensure, please consult the hospital dietitian for an appropriate substitute. [name], [name] ([id number]) Electronic Signature(s) Signed: 09/28/2021 3:54:34 PM By: Worthy Keeler PA-C Signed: 10/01/2021 4:15:07 PM By: Massie Kluver Entered By: Massie Kluver on 09/28/2021 11:44:16 [name], [name] ([id number]) -------------------------------------------------------------------------------- Problem List Details Patient Name: Hutt, [name] Date of Service: 09/28/2021 10:30 AM Medical Record Number:  [id number] Patient Account Number: [id number] Date of Birth/Sex: March 01, 1970 (51 y.o. F) Treating RN: Cornell Barman Primary Care Provider: Evern Bio Other Clinician: Massie Kluver Referring Provider: Evern Bio Treating Provider/Extender: Jeri Cos Weeks in Treatment: 19 Active Problems ICD-10 Encounter Code Description Active Date MDM Diagnosis M86.371 Chronic multifocal osteomyelitis, right ankle and foot 05/18/2021 No Yes E11.621 Type 2 diabetes mellitus with foot ulcer 05/18/2021 No Yes L97.514 Non-pressure chronic ulcer of other part of right foot with necrosis of 05/18/2021 No Yes bone I50.42 Chronic combined systolic (congestive) and diastolic (congestive) heart 05/18/2021 No Yes failure I10 Essential (primary) hypertension 05/18/2021 No Yes N18.6 End stage renal disease 05/18/2021 No Yes Z99.2 Dependence on renal dialysis 05/18/2021 No Yes Z99.81 Dependence on supplemental oxygen 05/18/2021 No Yes Inactive Problems Resolved Problems Electronic Signature(s) Signed: 09/28/2021 10:38:21 AM By: Worthy Keeler PA-C Entered By: Worthy Keeler on 09/28/2021 10:38:21 [name], [name] ([id number]) -------------------------------------------------------------------------------- Progress Note Details Patient Name: [name], [name] Date of Service: 09/28/2021 10:30 AM Medical Record Number: [id number] Patient Account Number: [id number] Date of Birth/Sex: 25-Mar-1970 (51 y.o. F) Treating RN: Cornell Barman Primary Care Provider: Evern Bio Other Clinician: Massie Kluver Referring Provider: Evern Bio Treating Provider/Extender: Skipper Cliche in Treatment: 19 Subjective Chief Complaint Information obtained from Patient Right foot gangrene and osteomyelitis History of Present Illness (HPI) 10/04/2020 upon evaluation today patient presents for initial inspection here in the clinic concerning a fairly concerning wound over her right fifth toe towards the medial aspect which  actually probes all the way down to bone. There is evidence of necrotic muscle as well and I do see tendon exposed as well. Subsequently this does have me very concerned about where things stand here. Again this is the first time that I am seeing her and on top of everything else her ABIs are noncompressible. I think that arterial flow is the primary concern here. Of note the patient states this was first noted July 1. She had a x-ray on July 4 which was negative for bone infection. She has been on 2 antibiotics she is unsure of the name. With that being said her most recent hemoglobin A1c was 8.1 that was December 2022. She otherwise does have a history obviously of diabetes mellitus type 2, hypertension, end-stage renal disease with dependence on renal dialysis, and she is also dependent on supplemental oxygen. Readmission: 05/14/2021 this is a patient whom I previously seen October 04, 2020. With that being said she is subsequently on 21 October 2020 ended up having an amputation of her fifth digit of her foot unfortunately. This had progressed fairly quickly into what sounds to been gangrene at that time. Subsequently right now based on what I am seeing the patient actually has a necrotic area on the end of her foot and has been recommended that the only option really here is going to be a amputation which would be a below-knee amputation. She is really not interested in that and wants to try to give this every chance  it can to heal. For that reason I discussed with her that definitely we can see what we can do to try to improve this situation and try to help get this doing better as best we can. With that being said there is definitely a portion of her wound that is just not good to be able to heal no matter what it is already starting to auto amputate. This includes potentially digits of her foot as well based on what I am seeing. She does have confirmed osteomyelitis noted by MRI. She is also  currently on IV vancomycin. She does undergo dialysis on Tuesday, Thursday, and Saturday. The patient does have a history of diabetes mellitus type 2, congestive heart failure, hypertension, end-stage renal disease, dependence on renal dialysis, and dependence on supplemental oxygen. With that being said I am going to need to look into her heart failure and the significance of this as far as hyperbarics is concerned before we initiate treatment. She is probably also going to need an updated EKG and chest x-ray unless its already been done recently. 05/28/2021 upon evaluation today patient appears to be doing well with regard to her wound all things considered. I am going to try to see what we can do about trimmed away some of the necrotic tissue that is really just hanging on here and there and not really helpful. Feel like if we can slowly start doing this maybe we will be able to get to the point where this is better and better as far as cleaning up the surface of the wound is concerned. Still as I explained I do believe some of her toes are not salvageable but again she is looking more towards autoamputation versus want to do anything definitive here from a surgical standpoint. I have been researching whether she would be an okay candidate for hyperbarics or not and I found that her x-ray but she had a chest x-ray recently was negative for any acute disease and appear to be stable. EKG was also stable. She did also have a cardiac echo which showed that her ejection fraction was 60 to 65% and therefore I think she is a candidate for hyperbarics. Again I do believe that this can help with preventing amputation which right now she has been told that her only option would be a below-knee amputation. Nonetheless I think that if we can get the heel leading to spread and the majority of this cleared away that it is possible we might even be able to get a surgeon just to surgically remove the nonviable toes  and not have to go the full way of complete amputation. Nonetheless we do have a ways to go before we will get to that point. Either way I think the hyperbarics could be an excellent support as far as that is concerned. 06-05-2021 upon evaluation today patient's wound is showing some signs of improvement. This does seem to be slow and of course it is understandable considering what we are seeing. She has discussed and looked up information about hyperbarics and in the end comes back today and states that she does want to proceed. I honestly do feel like that is her best chance at saving the foot. Even if we are able to get her into a surgeon after we obtain some good healing approximately so that there is not as much necrotic tissue in general she may even be able to have a transmetatarsal amputation and get this to heal. Again I  am not saying this is can be an easy road but it may be her best road as far as thinking about how to achieve some type of complete healing as I do not believe the toes are viable at all to be honest. 07-06-2021 upon evaluation today patient appears to be doing well currently in regard to her foot all things considered. Again she unfortunately was unable to do the hyperbaric oxygen therapy this was due to her claustrophobia. Nonetheless she tells me that she is pleased with how the foot appears and she does feel like there is good improvement going on here. In general I think that we are on the right track. If she is going to take a very long time and to be honest the route of autoamputation which is pretty much the way this is going is not a quick or easy road but nonetheless she seems to be taking care of her foot quite well. She is in good spirits. 07-27-2021 upon evaluation today patient appears to be doing well currently in regard to her foot all things considered. A lot of the eschar is starting to lift up around the edges which is good there is still significant necrotic  tissue on the distal portion of the foot. This is slowly going to work its way off. Based on what I am seeing currently I do not believe that there is any evidence of infection which is good news. 08-31-2021 upon evaluation today patient's wound actually appears to be doing decently well. I do not see anything in particular to try to trim away today. With that being said she continues to slowly allow this to do what is going to do as far as autoamputation is concerned. I did have another conversation with her today about the possibility of seeing a surgeon for a transmetatarsal amputation which again would help to get this to close [place], [place] ([id number]) faster and obviously I think would be a much speedy year and safer way to proceed with amputation. With that being said she tells me that she would really prefer to just let this go at "God pace". 09-28-2021 upon evaluation today patient's foot actually appears to be doing much better. Fortunately I do not see any signs of infection which is great news and overall I am extremely pleased with where we stand today. There does not appear to be any signs of active infection locally or systemically which is great news. No fevers, chills, nausea, vomiting, or diarrhea. Objective Constitutional Obese and well-hydrated in no acute distress. Vitals Time Taken: 10:36 AM, Height: 64 in, Weight: 259 lbs, BMI: 44.5, Temperature: 98.4 F, Pulse: 96 bpm, Respiratory Rate: 18 breaths/min, Blood Pressure: 118/81 mmHg. Respiratory normal breathing without difficulty. Psychiatric this patient is able to make decisions and demonstrates good insight into disease process. Alert and Oriented x 3. pleasant and cooperative. General Notes: Upon inspection patient's wound bed showed evidence of good granulation and epithelization at this point. Fortunately I do not see any signs of infection I did actually perform debridement of clearway some of the callus and eschar  as well as some of the slough and biofilm over the portion of the wound that was more proximal including some necrotic tendon/muscle. The patient tolerated this debridement today without complication and postdebridement the wound bed appears to be doing much better which is great news. Overall I think that we are on the right track more carefully removing what we can and cleaning up the surface  of the wounds that are healthy all in good time trying to get this to heal and fill it appropriately so that we can hopefully get this completely closed for her. Again we know that the end of the foot at least a portion of this is no longer viable and eventually is going to self amputate we are just continuing to remove what we can as far as necrotic tissue is concerned until we get to that point so that hopefully in the end this will be able to heal on its own she still does not want any surgical intervention she wants to let this proceed as God would like it. Integumentary (Hair, Skin) Wound #2 status is Open. Original cause of wound was Gradually Appeared. The date acquired was: 10/15/2020. The wound has been in treatment 19 weeks. The wound is located on the Right,Circumferential Foot. The wound measures 8.5cm length x 9cm width x 0.5cm depth; 60.083cm^2 area and 30.041cm^3 volume. There is tendon and Fat Layer (Subcutaneous Tissue) exposed. There is a medium amount of serosanguineous drainage noted. There is small (1-33%) red granulation within the wound bed. There is a large (67-100%) amount of necrotic tissue within the wound bed including Eschar. Assessment Active Problems ICD-10 Chronic multifocal osteomyelitis, right ankle and foot Type 2 diabetes mellitus with foot ulcer Non-pressure chronic ulcer of other part of right foot with necrosis of bone Chronic combined systolic (congestive) and diastolic (congestive) heart failure Essential (primary) hypertension End stage renal disease Dependence  on renal dialysis Dependence on supplemental oxygen Procedures Wound #2 Pre-procedure diagnosis of Wound #2 is a Diabetic Wound/Ulcer of the Lower Extremity located on the Right,Circumferential Foot .Severity of Tissue Pre Debridement is: Necrosis of muscle. There was a Excisional Skin/Subcutaneous Tissue Debridement with a total area of 22.26 sq cm [name], [name] M. ([id number]) performed by Tommie Sams., PA-C. With the following instrument(s): Curette, and Scissors to remove Viable and Non-Viable tissue/material. Material removed includes Eschar, Callus, Subcutaneous Tissue, and Slough. A time out was conducted at 11:34, prior to the start of the procedure. A Minimum amount of bleeding was controlled with Pressure. The procedure was tolerated well. Post Debridement Measurements: 9cm length x 9cm width x 0.5cm depth; 31.809cm^3 volume. Character of Wound/Ulcer Post Debridement is stable. Severity of Tissue Post Debridement is: Necrosis of muscle. Post procedure Diagnosis Wound #2: Same as Pre-Procedure Plan Follow-up Appointments: Return Appointment in 3 weeks. Nurse Visit as needed Home Health: [facility] for wound care. May utilize formulary equivalent dressing for wound treatment orders unless otherwise specified. Home Health Nurse may visit PRN to address patient s wound care needs. Scheduled days for dressing changes to be completed; exception, patient has scheduled wound care visit that day. **Please direct any NON-WOUND related issues/requests for orders to patient's Primary Care Physician. **If current dressing causes regression in wound condition, may D/C ordered dressing product/s and apply Normal Saline Moist Dressing daily until next Helen or Other MD appointment. **Notify [facility] of regression in wound condition at [phone]. Bathing/ Shower/ Hygiene: Clean wound with Normal Saline or wound  cleanser. Wash wounds with antibacterial soap and water. May shower; gently cleanse wound with antibacterial soap, rinse and pat dry prior to dressing wounds No tub bath. Off-Loading: Open toe surgical shoe - provided at visit Hyperbaric Oxygen Therapy: Evaluate for HBO Therapy Indication and location: - right foot If appropriate for treatment, begin HBOT per protocol: 2.5 ATA for 90 Minutes with  2 Five (5) Minute Air Breaks One treatment per day (delivered Monday through Friday unless otherwise specified in Special Instructions below): Total # of Treatments: - 40 Finger stick Blood Glucose Pre- and Post- HBOT Treatment. Follow Hyperbaric Oxygen Glycemia Protocol WOUND #2: - Foot Wound Laterality: Right, Circumferential Cleanser: Normal Saline 1 x Per Day/30 Days Discharge Instructions: Wash your hands with soap and water. Remove old dressing, discard into plastic bag and place into trash. Cleanse the wound with Normal Saline prior to applying a clean dressing using gauze sponges, not tissues or cotton balls. Do not scrub or use excessive force. Pat dry using gauze sponges, not tissue or cotton balls. Cleanser: Soap and Water 1 x Per Day/30 Days Discharge Instructions: Gently cleanse wound with antibacterial soap, rinse and pat dry prior to dressing wounds Topical: Betadine 1 x Per Day/30 Days Discharge Instructions: Apply betadine as directed. apply to gauze and pack sites and lay op open areas to top of foot Primary Dressing: Gauze 1 x Per Day/30 Days Discharge Instructions: As directed: betadine applied Secondary Dressing: ABD Pad 5x9 (in/in) 1 x Per Day/30 Days Discharge Instructions: Cover with ABD pad Secured With: Medipore Tape - 73M Medipore H Soft Cloth Surgical Tape, 2x2 (in/yd) 1 x Per Day/30 Days Secured With: Kerlix Roll Sterile or Non-Sterile 6-ply 4.5x4 (yd/yd) 1 x Per Day/30 Days Discharge Instructions: Apply Kerlix as directed Secured With: [employer] Dressing,  Latex-free, Size 5, Small-Head / Shoulder / Thigh 1 x Per Day/30 Days 1. I would recommend currently that we going continue with the wound care measures as before using the gauze moistened with Betadine to help keep everything clean and dry this seems to be doing quite well. 2. I am going to recommend that we have the patient continue with the close monitoring of the foot to make sure nothing becomes infected if anything changes she should definitely contact the office and let me know but my hope is that this will continue to show signs of improvement week by week and month by month as we see her. Again at some point this is going to auto amputate but again I am not sure exactly when that point will be. We will see patient back for reevaluation in 4 weeks here in the clinic. If anything worsens or changes patient will contact our office for additional recommendations. Electronic Signature(s) Signed: 09/28/2021 3:53:17 PM By: Worthy Keeler PA-C Entered By: Worthy Keeler on 09/28/2021 15:53:17 Fikes, [name] ([id number]) Mcenroe, [name] ([id number]) -------------------------------------------------------------------------------- SuperBill Details Patient Name: Basu, [name] Date of Service: 09/28/2021 Medical Record Number: [id number] Patient Account Number: [id number] Date of Birth/Sex: November 06, 1970 (51 y.o. F) Treating RN: Cornell Barman Primary Care Provider: Evern Bio Other Clinician: Massie Kluver Referring Provider: Evern Bio Treating Provider/Extender: Jeri Cos Weeks in Treatment: 19 Diagnosis Coding ICD-10 Codes Code Description M86.371 Chronic multifocal osteomyelitis, right ankle and foot E11.621 Type 2 diabetes mellitus with foot ulcer L97.514 Non-pressure chronic ulcer of other part of right foot with necrosis of bone I50.42 Chronic combined systolic (congestive) and diastolic (congestive) heart failure I10 Essential (primary) hypertension N18.6 End stage  renal disease Z99.2 Dependence on renal dialysis Z99.81 Dependence on supplemental oxygen Facility Procedures CPT4 Code: 56314970 Description: 11042 - DEB SUBQ TISSUE 20 SQ CM/< Modifier: Quantity: 1 CPT4 Code: Description: ICD-10 Diagnosis Description L97.514 Non-pressure chronic ulcer of other part of right foot with necrosis of Modifier: bone Quantity: CPT4 Code: 26378588 Description: 11045 - DEB SUBQ TISS EA ADDL 20CM  Modifier: Quantity: 1 CPT4 Code: Description: ICD-10 Diagnosis Description L97.514 Non-pressure chronic ulcer of other part of right foot with necrosis of Modifier: bone Quantity: Physician Procedures CPT4 Code: 8110315 Description: 94585 - WC PHYS SUBQ TISS 20 SQ CM Modifier: Quantity: 1 CPT4 Code: Description: ICD-10 Diagnosis Description L97.514 Non-pressure chronic ulcer of other part of right foot with necrosis of b Modifier: one Quantity: CPT4 Code: 9292446 Description: 28638 - WC PHYS SUBQ TISS EA ADDL 20 CM Modifier: Quantity: 1 CPT4 Code: Description: ICD-10 Diagnosis Description L97.514 Non-pressure chronic ulcer of other part of right foot with necrosis of b Modifier: one Quantity: Electronic Signature(s) Signed: 09/28/2021 3:53:57 PM By: Worthy Keeler PA-C Entered By: Worthy Keeler on 09/28/2021 15:53:57

## 2021-09-29 DIAGNOSIS — N186 End stage renal disease: Secondary | ICD-10-CM | POA: Diagnosis not present

## 2021-09-29 DIAGNOSIS — Z992 Dependence on renal dialysis: Secondary | ICD-10-CM | POA: Diagnosis not present

## 2021-10-01 DIAGNOSIS — I132 Hypertensive heart and chronic kidney disease with heart failure and with stage 5 chronic kidney disease, or end stage renal disease: Secondary | ICD-10-CM | POA: Diagnosis not present

## 2021-10-01 DIAGNOSIS — N186 End stage renal disease: Secondary | ICD-10-CM | POA: Diagnosis not present

## 2021-10-01 DIAGNOSIS — Z794 Long term (current) use of insulin: Secondary | ICD-10-CM | POA: Diagnosis not present

## 2021-10-01 DIAGNOSIS — L97513 Non-pressure chronic ulcer of other part of right foot with necrosis of muscle: Secondary | ICD-10-CM | POA: Diagnosis not present

## 2021-10-01 DIAGNOSIS — Z9981 Dependence on supplemental oxygen: Secondary | ICD-10-CM | POA: Diagnosis not present

## 2021-10-01 DIAGNOSIS — E1122 Type 2 diabetes mellitus with diabetic chronic kidney disease: Secondary | ICD-10-CM | POA: Diagnosis not present

## 2021-10-01 DIAGNOSIS — I5032 Chronic diastolic (congestive) heart failure: Secondary | ICD-10-CM | POA: Diagnosis not present

## 2021-10-01 DIAGNOSIS — E1151 Type 2 diabetes mellitus with diabetic peripheral angiopathy without gangrene: Secondary | ICD-10-CM | POA: Diagnosis not present

## 2021-10-01 DIAGNOSIS — D631 Anemia in chronic kidney disease: Secondary | ICD-10-CM | POA: Diagnosis not present

## 2021-10-01 DIAGNOSIS — E11621 Type 2 diabetes mellitus with foot ulcer: Secondary | ICD-10-CM | POA: Diagnosis not present

## 2021-10-01 DIAGNOSIS — Z992 Dependence on renal dialysis: Secondary | ICD-10-CM | POA: Diagnosis not present

## 2021-10-01 NOTE — Progress Notes (Signed)
QUETZALLI, CLOS ([id number]) Visit Report for 09/28/2021 Arrival Information Details Patient Name: [name], [name] Date of Service: 09/28/2021 10:30 AM Medical Record Number: [id number] Patient Account Number: [id number] Date of Birth/Sex: Dec 15, 1970 (51 y.o. F) Treating RN: Cornell Barman Primary Care Derico Mitton: Evern Bio Other Clinician: Massie Kluver Referring Arrin Pintor: Evern Bio Treating Milta Croson/Extender: Skipper Cliche in Treatment: 19 Visit Information History Since Last Visit All ordered tests and consults were completed: No Patient Arrived: Other Added or deleted any medications: No Arrival Time: 10:34 Any new allergies or adverse reactions: No Accompanied By: spouse Had a fall or experienced change in No Transfer Assistance: EasyPivot Patient activities of daily living that may affect Lift risk of falls: Patient Requires Transmission-Based No Hospitalized since last visit: No Precautions: Pain Present Now: Yes Patient Has Alerts: No Electronic Signature(s) Signed: 10/01/2021 4:15:07 PM By: Massie Kluver Entered By: Massie Kluver on 09/28/2021 10:35:18 [name], [name] ([id number]) -------------------------------------------------------------------------------- Clinic Level of Care Assessment Details Patient Name: [name], [name] Date of Service: 09/28/2021 10:30 AM Medical Record Number: [id number] Patient Account Number: [id number] Date of Birth/Sex: [birth year] (51 y.o. F) Treating RN: Cornell Barman Primary Care Chiyeko Ferre: Evern Bio Other Clinician: Massie Kluver Referring Bluford Sedler: Evern Bio Treating Garlan Drewes/Extender: Skipper Cliche in Treatment: 19 Clinic Level of Care Assessment Items TOOL 1 Quantity Score '[]'$  - Use when EandM and Procedure is performed on INITIAL visit 0 ASSESSMENTS - Nursing Assessment / Reassessment '[]'$  - General Physical Exam (combine w/ comprehensive assessment (listed just below) when performed on new 0 pt.  evals) '[]'$  - 0 Comprehensive Assessment (HX, ROS, Risk Assessments, Wounds Hx, etc.) ASSESSMENTS - Wound and Skin Assessment / Reassessment '[]'$  - Dermatologic / Skin Assessment (not related to wound area) 0 ASSESSMENTS - Ostomy and/or Continence Assessment and Care '[]'$  - Incontinence Assessment and Management 0 '[]'$  - 0 Ostomy Care Assessment and Management (repouching, etc.) PROCESS - Coordination of Care '[]'$  - Simple Patient / Family Education for ongoing care 0 '[]'$  - 0 Complex (extensive) Patient / Family Education for ongoing care '[]'$  - 0 Staff obtains Programmer, systems, Records, Test Results / Process Orders '[]'$  - 0 Staff telephones HHA, Nursing Homes / Clarify orders / etc '[]'$  - 0 Routine Transfer to another Facility (non-emergent condition) '[]'$  - 0 Routine Hospital Admission (non-emergent condition) '[]'$  - 0 New Admissions / Biomedical engineer / Ordering NPWT, Apligraf, etc. '[]'$  - 0 Emergency Hospital Admission (emergent condition) PROCESS - Special Needs '[]'$  - Pediatric / Minor Patient Management 0 '[]'$  - 0 Isolation Patient Management '[]'$  - 0 Hearing / Language / Visual special needs '[]'$  - 0 Assessment of Community assistance (transportation, D/C planning, etc.) '[]'$  - 0 Additional assistance / Altered mentation '[]'$  - 0 Support Surface(s) Assessment (bed, cushion, seat, etc.) INTERVENTIONS - Miscellaneous '[]'$  - External ear exam 0 '[]'$  - 0 Patient Transfer (multiple staff / Civil Service fast streamer / Similar devices) '[]'$  - 0 Simple Staple / Suture removal (25 or less) '[]'$  - 0 Complex Staple / Suture removal (26 or more) '[]'$  - 0 Hypo/Hyperglycemic Management (do not check if billed separately) '[]'$  - 0 Ankle / Brachial Index (ABI) - do not check if billed separately Has the patient been seen at the hospital within the last three years: Yes Total Score: 0 Level Of Care: ____ [name] ([id number]) Electronic Signature(s) Signed: 10/01/2021 4:15:07 PM By: Massie Kluver Entered By: Massie Kluver  on 09/28/2021 11:44:24 [name], [name] ([id number]) -------------------------------------------------------------------------------- Encounter Discharge Information Details Patient Name: [name], [name] Date of  Service: 09/28/2021 10:30 AM Medical Record Number: [id number] Patient Account Number: [id number] Date of Birth/Sex: 1970/07/23 (51 y.o. F) Treating RN: Cornell Barman Primary Care Phillipe Clemon: Evern Bio Other Clinician: Massie Kluver Referring Norelle Runnion: Evern Bio Treating Mousa Prout/Extender: Skipper Cliche in Treatment: 19 Encounter Discharge Information Items Post Procedure Vitals Discharge Condition: Stable Temperature (F): 98.4 Ambulatory Status: Other Pulse (bpm): 96 Discharge Destination: Home Respiratory Rate (breaths/min): 18 Transportation: Private Auto Blood Pressure (mmHg): 118/81 Accompanied By: spouse Schedule Follow-up Appointment: Yes Clinical Summary of Care: Electronic Signature(s) Signed: 10/01/2021 4:15:07 PM By: Massie Kluver Entered By: Massie Kluver on 09/28/2021 11:54:09 [name], [name] ([id number]) -------------------------------------------------------------------------------- Lower Extremity Assessment Details Patient Name: [name], [name] Date of Service: 09/28/2021 10:30 AM Medical Record Number: [id number] Patient Account Number: [id number] Date of Birth/Sex: 08-03-1970 (51 y.o. F) Treating RN: Cornell Barman Primary Care Dontray Haberland: Evern Bio Other Clinician: Massie Kluver Referring Koby Hartfield: Evern Bio Treating Youlanda Tomassetti/Extender: Skipper Cliche in Treatment: 19 Electronic Signature(s) Signed: 09/28/2021 3:39:08 PM By: Gretta Cool BSN, RN, CWS, Kim RN, BSN Signed: 10/01/2021 4:15:07 PM By: Massie Kluver Entered By: Massie Kluver on 09/28/2021 10:48:46 [name], [name] ([id number]) -------------------------------------------------------------------------------- Multi Wound Chart Details Patient Name: [name], [name] Date  of Service: 09/28/2021 10:30 AM Medical Record Number: [id number] Patient Account Number: [id number] Date of Birth/Sex: 1970-07-10 (51 y.o. 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 Comorbid History: Cataracts, Anemia, Asthma, Chronic N/A N/A Obstructive Pulmonary Disease (COPD), Congestive Heart Failure, Hypertension, Type II Diabetes, End Stage Renal Disease, Osteoarthritis, Neuropathy Date Acquired: 10/15/2020 N/A N/A Weeks of Treatment: 19 N/A N/A Wound Status: Open N/A N/A Wound Recurrence: No N/A N/A Pending Amputation on Yes N/A N/A Presentation: Measurements L x W x D (cm) 8.5x9x0.5 N/A N/A Area (cm) : 60.083 N/A N/A Volume (cm) : 30.041 N/A N/A % Reduction in Area: 43.30% N/A N/A % Reduction in Volume: 85.80% N/A N/A Classification: Grade 4 N/A N/A Exudate Amount: Medium N/A N/A Exudate Type: Serosanguineous N/A N/A Exudate Color: red, brown N/A N/A Granulation Amount: Small (1-33%) N/A N/A Granulation Quality: Red N/A N/A Necrotic Amount: Large (67-100%) N/A N/A Necrotic Tissue: Eschar N/A N/A Exposed Structures: Fat Layer (Subcutaneous Tissue): N/A N/A Yes Tendon: Yes Epithelialization: None N/A N/A Treatment Notes Electronic Signature(s) Signed: 10/01/2021 4:15:07 PM By: Massie Kluver Entered By: Massie Kluver on 09/28/2021 10:49:20 Jenson, [name] ([id number]) Hiemstra, [name]  ([id number]) -------------------------------------------------------------------------------- Amelia Details Patient Name: Hyden, [name] Date of Service: 09/28/2021 10:30 AM Medical Record Number: [id number] Patient Account Number: [id number] Date of Birth/Sex: [birth year] (51 y.o. F) Treating RN: Cornell Barman Primary Care Shailen Thielen: Evern Bio Other Clinician: Massie Kluver Referring Zada Haser: Evern Bio Treating Aydia Maj/Extender: Skipper Cliche in Treatment: 19 Active Inactive HBO Nursing Diagnoses: Anxiety related to feelings of confinement associated with the hyperbaric oxygen chamber Anxiety related to knowledge deficit of hyperbaric oxygen therapy and treatment procedures Discomfort related to temperature and humidity changes inside hyperbaric chamber Potential for barotraumas to ears, sinuses, teeth, and lungs or cerebral gas embolism related to changes in atmospheric pressure inside hyperbaric oxygen chamber Potential for oxygen toxicity seizures related to delivery of 100%  oxygen at an increased atmospheric pressure Potential for pulmonary oxygen toxicity related to delivery of 100% oxygen at an increased atmospheric pressure Goals: Barotrauma will be prevented during HBO2 Date Initiated: 06/05/2021 Target Resolution Date: 06/05/2021 Goal Status: Active Patient and/or family will be able to state/discuss factors appropriate to the management of their disease process during treatment Date Initiated: 06/05/2021 Target Resolution Date: 06/05/2021 Goal Status: Active Patient will tolerate the hyperbaric oxygen therapy treatment Date Initiated: 06/05/2021 Target Resolution Date: 06/05/2021 Goal Status: Active Patient will tolerate the internal climate of the chamber Date Initiated: 06/05/2021 Target Resolution Date: 06/05/2021 Goal Status: Active Patient/caregiver will verbalize understanding of HBO goals, rationale, procedures and potential  hazards Date Initiated: 06/05/2021 Target Resolution Date: 06/05/2021 Goal Status: Active Signs and symptoms of pulmonary oxygen toxicity will be recognized and promptly addressed Date Initiated: 06/05/2021 Target Resolution Date: 06/05/2021 Goal Status: Active Signs and symptoms of seizure will be recognized and promptly addressed ; seizing patients will suffer no harm Date Initiated: 06/05/2021 Target Resolution Date: 06/05/2021 Goal Status: Active Interventions: Administer a five (5) minute air break for patient if signs and symptoms of seizure appear and notify the hyperbaric physician Administer decongestants, per physician orders, prior to HBO2 Administer the correct therapeutic gas delivery based on the patients needs and limitations, per physician order Assess and provide for patientos comfort related to the hyperbaric environment and equalization of middle ear Assess for signs and symptoms related to adverse events, including but not limited to confinement anxiety, pneumothorax, oxygen toxicity and baurotrauma Assess patient for any history of confinement anxiety Assess patient's knowledge and expectations regarding hyperbaric medicine and provide education related to the hyperbaric environment, goals of treatment and prevention of adverse events Implement protocols to decrease risk of pneumothorax in high risk patients Notes: Necrotic Tissue Nursing Diagnoses: Impaired tissue integrity related to necrotic/devitalized tissue Eakes, [name] ([id number]) Knowledge deficit related to management of necrotic/devitalized tissue Goals: Necrotic/devitalized tissue will be minimized in the wound bed Date Initiated: 06/05/2021 Target Resolution Date: 06/05/2021 Goal Status: Active Patient/caregiver will verbalize understanding of reason and process for debridement of necrotic tissue Date Initiated: 06/05/2021 Target Resolution Date: 06/05/2021 Goal Status: Active Interventions: Assess  patient pain level pre-, during and post procedure and prior to discharge Provide education on necrotic tissue and debridement process Notes: Orientation to the Wound Care Program Nursing Diagnoses: Knowledge deficit related to the wound healing center program Goals: Patient/caregiver will verbalize understanding of the [facility] Date Initiated: 05/18/2021 Target Resolution Date: 05/18/2021 Goal Status: Active Interventions: Provide education on orientation to the wound center Notes: Osteomyelitis Nursing Diagnoses: Infection: osteomyelitis Knowledge deficit related to disease process and management Potential for infection: osteomyelitis Goals: Diagnostic evaluation for osteomyelitis completed as ordered Date Initiated: 06/05/2021 Target Resolution Date: 06/05/2021 Goal Status: Active Patient/caregiver will verbalize understanding of disease process and disease management Date Initiated: 06/05/2021 Target Resolution Date: 06/05/2021 Goal Status: Active [name] osteomyelitis will resolve Date Initiated: 06/05/2021 Target Resolution Date: 06/05/2021 Goal Status: Active Signs and symptoms for osteomyelitis will be recognized and promptly addressed Date Initiated: 06/05/2021 Target Resolution Date: 06/05/2021 Goal Status: Active Interventions: Assess for signs and symptoms of osteomyelitis resolution every visit Provide education on osteomyelitis Notes: Wound/Skin Impairment Nursing Diagnoses: Impaired tissue integrity Knowledge deficit related to ulceration/compromised skin integrity Goals: Ulcer/skin breakdown will have a volume reduction of 30% by week 4 Date Initiated: 05/18/2021 Target Resolution Date: 06/15/2021 DELORES, EDELSTEIN ([id number]) Goal Status: Active Ulcer/skin breakdown will have a volume reduction of 50%  by week 8 Date Initiated: 05/18/2021 Target Resolution Date: 07/13/2021 Goal Status: Active Ulcer/skin breakdown will have a volume  reduction of 80% by week 12 Date Initiated: 05/18/2021 Target Resolution Date: 08/10/2021 Goal Status: Active Ulcer/skin breakdown will heal within 14 weeks Date Initiated: 05/18/2021 Target Resolution Date: 08/24/2021 Goal Status: Active Interventions: Assess patient/caregiver ability to obtain necessary supplies Assess patient/caregiver ability to perform ulcer/skin care regimen upon admission and as needed Assess ulceration(s) every visit Provide education on ulcer and skin care Notes: Electronic Signature(s) Signed: 09/28/2021 3:39:08 PM By: Gretta Cool, BSN, RN, CWS, Kim RN, BSN Signed: 10/01/2021 4:15:07 PM By: Massie Kluver Entered By: Massie Kluver on 09/28/2021 10:48:51 Narducci, [name] ([id number]) -------------------------------------------------------------------------------- Pain Assessment Details Patient Name: Hussar, [name] Date of Service: 09/28/2021 10:30 AM Medical Record Number: [id number] Patient Account Number: [id number] Date of Birth/Sex: Oct 16, 1970 (51 y.o. F) Treating RN: Cornell Barman Primary Care Shantanu Strauch: Evern Bio Other Clinician: Massie Kluver Referring Ghadeer Kastelic: Evern Bio Treating Halley Shepheard/Extender: Skipper Cliche in Treatment: 19 Active Problems Location of Pain Severity and Description of Pain Patient Has Paino No Site Locations Duration of the Pain. Constant / Intermittento Intermittent Rate the pain. Current Pain Level: 6 Character of Pain Describe the Pain: Burning, Other: stinging Pain Management and Medication Current Pain Management: Medication: Yes Rest: Yes Activity: No Electronic Signature(s) Signed: 09/28/2021 3:39:08 PM By: Gretta Cool, BSN, RN, CWS, Kim RN, BSN Signed: 10/01/2021 4:15:07 PM By: Massie Kluver Entered By: Massie Kluver on 09/28/2021 10:39:35 [place]arling, [name] ([id number]) -------------------------------------------------------------------------------- Patient/Caregiver Education Details Patient Name: Caylor,  [name] Date of Service: 09/28/2021 10:30 AM Medical Record Number: [id number] Patient Account Number: [id number] Date of Birth/Gender: [birth year] (51 y.o. F) Treating RN: Cornell Barman Primary Care Physician: Evern Bio Other Clinician: Massie Kluver Referring Physician: Evern Bio Treating Physician/Extender: Skipper Cliche in Treatment: 19 Education Assessment Education Provided To: Patient Education Topics Provided Wound/Skin Impairment: Handouts: Other: continue wound care as directed Methods: Explain/Verbal Responses: State content correctly Electronic Signature(s) Signed: 10/01/2021 4:15:07 PM By: Massie Kluver Entered By: Massie Kluver on 09/28/2021 11:44:51 [place], [name] ([id number]) -------------------------------------------------------------------------------- Wound Assessment Details Patient Name: Baham, [name] Date of Service: 09/28/2021 10:30 AM Medical Record Number: [id number] Patient Account Number: [id number] Date of Birth/Sex: [birth year] (51 y.o. F) Treating RN: Cornell Barman Primary Care Berdie Malter: Evern Bio Other Clinician: Massie Kluver Referring Avyukt Cimo: Evern Bio Treating Karianna Gusman/Extender: Jeri Cos Weeks in Treatment: 19 Wound Status Wound Number: 2 Primary Diabetic Wound/Ulcer of the Lower Extremity Etiology: Wound Location: Right, Circumferential Foot Wound Open Wounding Event: Gradually Appeared Status: Date Acquired: 10/15/2020 Notes: open areas to midline right foot, lateral right foot, and top Weeks Of Treatment: 19 of right foot Clustered Wound: No Comorbid Cataracts, Anemia, Asthma, Chronic Obstructive Pulmonary Pending Amputation On Presentation History: Disease (COPD), Congestive Heart Failure, Hypertension, Type II Diabetes, End Stage Renal Disease, Osteoarthritis, Neuropathy Photos Wound Measurements Length: (cm) 8.5 Width: (cm) 9 Depth: (cm) 0.5 Area: (cm) 60.083 Volume: (cm) 30.041 % Reduction in  Area: 43.3% % Reduction in Volume: 85.8% Epithelialization: None Wound Description Classification: Grade 4 Exudate Amount: Medium Exudate Type: Serosanguineous Exudate Color: red, brown Foul Odor After Cleansing: No Slough/Fibrino Yes Wound Bed Granulation Amount: Small (1-33%) Exposed Structure Granulation Quality: Red Fat Layer (Subcutaneous Tissue) Exposed: Yes Necrotic Amount: Large (67-100%) Tendon Exposed: Yes Necrotic Quality: Eschar Treatment Notes Wound #2 (Foot) Wound Laterality: Right, Circumferential Cleanser Normal Saline Discharge Instruction: Wash your hands with soap and water. Remove old dressing, discard into plastic bag and place  into trash. Cleanse the wound with Normal Saline prior to applying a clean dressing using gauze sponges, not tissues or cotton balls. Do not scrub or use excessive force. Pat dry using gauze sponges, not tissue or cotton balls. Soap and Water Nardozzi, Wellington ([id number]) Discharge Instruction: Gently cleanse wound with antibacterial soap, rinse and pat dry prior to dressing wounds Peri-Wound Care Topical Betadine Discharge Instruction: Apply betadine as directed. apply to gauze and pack sites and lay op open areas to top of foot Primary Dressing Gauze Discharge Instruction: As directed: betadine applied Secondary Dressing ABD Pad 5x9 (in/in) Discharge Instruction: Cover with ABD pad Secured With Medipore Tape - 8M Medipore H Soft Cloth Surgical Tape, 2x2 (in/yd) Kerlix Roll Sterile or Non-Sterile 6-ply 4.5x4 (yd/yd) Discharge Instruction: Apply Kerlix as directed Stretch Net Dressing, Latex-free, Size 5, Small-Head / Shoulder / Thigh Compression Wrap Compression Stockings Add-Ons Electronic Signature(s) Signed: 09/28/2021 3:39:08 PM By: Gretta Cool, BSN, RN, CWS, Kim RN, BSN Signed: 10/01/2021 4:15:07 PM By: Massie Kluver Entered By: Massie Kluver on 09/28/2021 10:48:38 [place], [name]  ([id number]) -------------------------------------------------------------------------------- Vitals Details Patient Name: Heatwole, [name] Date of Service: 09/28/2021 10:30 AM Medical Record Number: [id number] Patient Account Number: [id number] Date of Birth/Sex: 1970-08-23 (51 y.o. F) Treating RN: Cornell Barman Primary Care Caitlen Worth: Evern Bio Other Clinician: Massie Kluver Referring Berlyn Malina: Evern Bio Treating Lanetta Figuero/Extender: Jeri Cos Weeks in Treatment: 19 Vital Signs Time Taken: 10:36 Temperature (F): 98.4 Height (in): 64 Pulse (bpm): 96 Weight (lbs): 259 Respiratory Rate (breaths/min): 18 Body Mass Index (BMI): 44.5 Blood Pressure (mmHg): 118/81 Reference Range: 80 - 120 mg / dl Electronic Signature(s) Signed: 10/01/2021 4:15:07 PM By: Massie Kluver Entered By: Massie Kluver on 09/28/2021 10:39:31

## 2021-10-02 ENCOUNTER — Telehealth: Payer: Self-pay | Admitting: Cardiovascular Disease

## 2021-10-02 ENCOUNTER — Telehealth: Payer: Self-pay | Admitting: Pulmonary Disease

## 2021-10-02 DIAGNOSIS — Z992 Dependence on renal dialysis: Secondary | ICD-10-CM | POA: Diagnosis not present

## 2021-10-02 DIAGNOSIS — J449 Chronic obstructive pulmonary disease, unspecified: Secondary | ICD-10-CM | POA: Diagnosis not present

## 2021-10-02 DIAGNOSIS — N186 End stage renal disease: Secondary | ICD-10-CM | POA: Diagnosis not present

## 2021-10-02 NOTE — Telephone Encounter (Signed)
Spoke to patient. She is requesting echo results.   Dr. Patsey Berthold, please advise. Thanks

## 2021-10-02 NOTE — Telephone Encounter (Signed)
Patient called stating she will need a sooner appointment with Dr. Rockey Situ due to results from her recent Echo test.

## 2021-10-02 NOTE — Telephone Encounter (Signed)
[name] Pita, MD  [name], CMA Her echocardiogram shows that she does have pulmonary hypertension meaning that the artery going from the heart to the lungs is under high pressure.  She also has [name] little weakness of the right side of the heart.  The left side of the heart still working strong.  One of her valves has narrowed significantly and this is the one called the mitral valve.  She should make an appointment with cardiology to have these issues looked into [name] little bit deeper.  This narrowing of the valve can cause shortness of breath.  It is also important that she use her BiPAP.  This will help unload the heart.   Patient is aware of results and voiced her understanding.  She will contact cardiology for an appointment.  Nothing further needed.

## 2021-10-02 NOTE — Telephone Encounter (Signed)
Attempted schedule sooner in a dod slot   Lmov

## 2021-10-02 NOTE — Telephone Encounter (Signed)
See the results section

## 2021-10-03 DIAGNOSIS — I5032 Chronic diastolic (congestive) heart failure: Secondary | ICD-10-CM | POA: Diagnosis not present

## 2021-10-03 DIAGNOSIS — I132 Hypertensive heart and chronic kidney disease with heart failure and with stage 5 chronic kidney disease, or end stage renal disease: Secondary | ICD-10-CM | POA: Diagnosis not present

## 2021-10-03 DIAGNOSIS — E1122 Type 2 diabetes mellitus with diabetic chronic kidney disease: Secondary | ICD-10-CM | POA: Diagnosis not present

## 2021-10-03 DIAGNOSIS — Z794 Long term (current) use of insulin: Secondary | ICD-10-CM | POA: Diagnosis not present

## 2021-10-03 DIAGNOSIS — N186 End stage renal disease: Secondary | ICD-10-CM | POA: Diagnosis not present

## 2021-10-03 DIAGNOSIS — L97513 Non-pressure chronic ulcer of other part of right foot with necrosis of muscle: Secondary | ICD-10-CM | POA: Diagnosis not present

## 2021-10-03 DIAGNOSIS — E1151 Type 2 diabetes mellitus with diabetic peripheral angiopathy without gangrene: Secondary | ICD-10-CM | POA: Diagnosis not present

## 2021-10-03 DIAGNOSIS — E11621 Type 2 diabetes mellitus with foot ulcer: Secondary | ICD-10-CM | POA: Diagnosis not present

## 2021-10-03 DIAGNOSIS — Z992 Dependence on renal dialysis: Secondary | ICD-10-CM | POA: Diagnosis not present

## 2021-10-03 DIAGNOSIS — Z9981 Dependence on supplemental oxygen: Secondary | ICD-10-CM | POA: Diagnosis not present

## 2021-10-03 DIAGNOSIS — D631 Anemia in chronic kidney disease: Secondary | ICD-10-CM | POA: Diagnosis not present

## 2021-10-04 DIAGNOSIS — N186 End stage renal disease: Secondary | ICD-10-CM | POA: Diagnosis not present

## 2021-10-04 DIAGNOSIS — Z992 Dependence on renal dialysis: Secondary | ICD-10-CM | POA: Diagnosis not present

## 2021-10-05 DIAGNOSIS — Z992 Dependence on renal dialysis: Secondary | ICD-10-CM | POA: Diagnosis not present

## 2021-10-05 DIAGNOSIS — D631 Anemia in chronic kidney disease: Secondary | ICD-10-CM | POA: Diagnosis not present

## 2021-10-05 DIAGNOSIS — N186 End stage renal disease: Secondary | ICD-10-CM | POA: Diagnosis not present

## 2021-10-05 DIAGNOSIS — I5032 Chronic diastolic (congestive) heart failure: Secondary | ICD-10-CM | POA: Diagnosis not present

## 2021-10-05 DIAGNOSIS — E1151 Type 2 diabetes mellitus with diabetic peripheral angiopathy without gangrene: Secondary | ICD-10-CM | POA: Diagnosis not present

## 2021-10-05 DIAGNOSIS — E1122 Type 2 diabetes mellitus with diabetic chronic kidney disease: Secondary | ICD-10-CM | POA: Diagnosis not present

## 2021-10-05 DIAGNOSIS — L97513 Non-pressure chronic ulcer of other part of right foot with necrosis of muscle: Secondary | ICD-10-CM | POA: Diagnosis not present

## 2021-10-05 DIAGNOSIS — Z9981 Dependence on supplemental oxygen: Secondary | ICD-10-CM | POA: Diagnosis not present

## 2021-10-05 DIAGNOSIS — Z794 Long term (current) use of insulin: Secondary | ICD-10-CM | POA: Diagnosis not present

## 2021-10-05 DIAGNOSIS — I132 Hypertensive heart and chronic kidney disease with heart failure and with stage 5 chronic kidney disease, or end stage renal disease: Secondary | ICD-10-CM | POA: Diagnosis not present

## 2021-10-05 DIAGNOSIS — E11621 Type 2 diabetes mellitus with foot ulcer: Secondary | ICD-10-CM | POA: Diagnosis not present

## 2021-10-06 DIAGNOSIS — Z992 Dependence on renal dialysis: Secondary | ICD-10-CM | POA: Diagnosis not present

## 2021-10-06 DIAGNOSIS — N186 End stage renal disease: Secondary | ICD-10-CM | POA: Diagnosis not present

## 2021-10-08 DIAGNOSIS — N186 End stage renal disease: Secondary | ICD-10-CM | POA: Diagnosis not present

## 2021-10-08 DIAGNOSIS — E1151 Type 2 diabetes mellitus with diabetic peripheral angiopathy without gangrene: Secondary | ICD-10-CM | POA: Diagnosis not present

## 2021-10-08 DIAGNOSIS — Z9981 Dependence on supplemental oxygen: Secondary | ICD-10-CM | POA: Diagnosis not present

## 2021-10-08 DIAGNOSIS — Z794 Long term (current) use of insulin: Secondary | ICD-10-CM | POA: Diagnosis not present

## 2021-10-08 DIAGNOSIS — I5032 Chronic diastolic (congestive) heart failure: Secondary | ICD-10-CM | POA: Diagnosis not present

## 2021-10-08 DIAGNOSIS — E11621 Type 2 diabetes mellitus with foot ulcer: Secondary | ICD-10-CM | POA: Diagnosis not present

## 2021-10-08 DIAGNOSIS — L97513 Non-pressure chronic ulcer of other part of right foot with necrosis of muscle: Secondary | ICD-10-CM | POA: Diagnosis not present

## 2021-10-08 DIAGNOSIS — D631 Anemia in chronic kidney disease: Secondary | ICD-10-CM | POA: Diagnosis not present

## 2021-10-08 DIAGNOSIS — Z992 Dependence on renal dialysis: Secondary | ICD-10-CM | POA: Diagnosis not present

## 2021-10-08 DIAGNOSIS — I132 Hypertensive heart and chronic kidney disease with heart failure and with stage 5 chronic kidney disease, or end stage renal disease: Secondary | ICD-10-CM | POA: Diagnosis not present

## 2021-10-08 DIAGNOSIS — E1122 Type 2 diabetes mellitus with diabetic chronic kidney disease: Secondary | ICD-10-CM | POA: Diagnosis not present

## 2021-10-09 DIAGNOSIS — N186 End stage renal disease: Secondary | ICD-10-CM | POA: Diagnosis not present

## 2021-10-09 DIAGNOSIS — Z992 Dependence on renal dialysis: Secondary | ICD-10-CM | POA: Diagnosis not present

## 2021-10-10 DIAGNOSIS — N186 End stage renal disease: Secondary | ICD-10-CM | POA: Diagnosis not present

## 2021-10-10 DIAGNOSIS — Z794 Long term (current) use of insulin: Secondary | ICD-10-CM | POA: Diagnosis not present

## 2021-10-10 DIAGNOSIS — Z9981 Dependence on supplemental oxygen: Secondary | ICD-10-CM | POA: Diagnosis not present

## 2021-10-10 DIAGNOSIS — I5032 Chronic diastolic (congestive) heart failure: Secondary | ICD-10-CM | POA: Diagnosis not present

## 2021-10-10 DIAGNOSIS — E1151 Type 2 diabetes mellitus with diabetic peripheral angiopathy without gangrene: Secondary | ICD-10-CM | POA: Diagnosis not present

## 2021-10-10 DIAGNOSIS — Z992 Dependence on renal dialysis: Secondary | ICD-10-CM | POA: Diagnosis not present

## 2021-10-10 DIAGNOSIS — I132 Hypertensive heart and chronic kidney disease with heart failure and with stage 5 chronic kidney disease, or end stage renal disease: Secondary | ICD-10-CM | POA: Diagnosis not present

## 2021-10-10 DIAGNOSIS — L97513 Non-pressure chronic ulcer of other part of right foot with necrosis of muscle: Secondary | ICD-10-CM | POA: Diagnosis not present

## 2021-10-10 DIAGNOSIS — D631 Anemia in chronic kidney disease: Secondary | ICD-10-CM | POA: Diagnosis not present

## 2021-10-10 DIAGNOSIS — E1122 Type 2 diabetes mellitus with diabetic chronic kidney disease: Secondary | ICD-10-CM | POA: Diagnosis not present

## 2021-10-10 DIAGNOSIS — E11621 Type 2 diabetes mellitus with foot ulcer: Secondary | ICD-10-CM | POA: Diagnosis not present

## 2021-10-11 DIAGNOSIS — Z992 Dependence on renal dialysis: Secondary | ICD-10-CM | POA: Diagnosis not present

## 2021-10-11 DIAGNOSIS — Z13220 Encounter for screening for lipoid disorders: Secondary | ICD-10-CM | POA: Diagnosis not present

## 2021-10-11 DIAGNOSIS — N186 End stage renal disease: Secondary | ICD-10-CM | POA: Diagnosis not present

## 2021-10-12 DIAGNOSIS — N186 End stage renal disease: Secondary | ICD-10-CM | POA: Diagnosis not present

## 2021-10-12 DIAGNOSIS — J9610 Chronic respiratory failure, unspecified whether with hypoxia or hypercapnia: Secondary | ICD-10-CM | POA: Diagnosis not present

## 2021-10-12 DIAGNOSIS — Z992 Dependence on renal dialysis: Secondary | ICD-10-CM | POA: Diagnosis not present

## 2021-10-12 DIAGNOSIS — J449 Chronic obstructive pulmonary disease, unspecified: Secondary | ICD-10-CM | POA: Diagnosis not present

## 2021-10-16 DIAGNOSIS — N186 End stage renal disease: Secondary | ICD-10-CM | POA: Diagnosis not present

## 2021-10-16 DIAGNOSIS — Z992 Dependence on renal dialysis: Secondary | ICD-10-CM | POA: Diagnosis not present

## 2021-10-18 DIAGNOSIS — Z992 Dependence on renal dialysis: Secondary | ICD-10-CM | POA: Diagnosis not present

## 2021-10-18 DIAGNOSIS — N186 End stage renal disease: Secondary | ICD-10-CM | POA: Diagnosis not present

## 2021-10-18 DIAGNOSIS — I5032 Chronic diastolic (congestive) heart failure: Secondary | ICD-10-CM | POA: Diagnosis not present

## 2021-10-18 DIAGNOSIS — J449 Chronic obstructive pulmonary disease, unspecified: Secondary | ICD-10-CM | POA: Diagnosis not present

## 2021-10-18 DIAGNOSIS — J9611 Chronic respiratory failure with hypoxia: Secondary | ICD-10-CM | POA: Diagnosis not present

## 2021-10-20 DIAGNOSIS — N186 End stage renal disease: Secondary | ICD-10-CM | POA: Diagnosis not present

## 2021-10-20 DIAGNOSIS — Z992 Dependence on renal dialysis: Secondary | ICD-10-CM | POA: Diagnosis not present

## 2021-10-20 NOTE — Progress Notes (Unsigned)
Cardiology Office Note  Date:  10/23/2021   ID:  [name], DOB Feb 18, 1971, MRN [id number]  PCP:  [name], NP   Chief Complaint  Patient presents with   Follow up Echo results.     Patient c/o chest pain at times and shortness of breath daily. Medications reviewed by the patient verbally.     HPI:  [name] is a 51 yo woman with medical history of  morbidly obese  uncontrolled diabetes   chronic diastolic CHF,  anemia,  Asthma Morbid obesity ESRD on HD 3w a week, started 03/2017 Osteomyelitis foot Moderate mitral valve stenosis/pulmonary hypertension On chronic oxygen who presents for  follow-up of her diastolic CHF.  Last seen in clinic 4/23 Low pressure on HD, Getting orthostasis symptoms on HD Sometimes stops filtration when blood pressure running low  Off ozempic Working on her weight  Lab work reviewed Total cholesterol greater than 230, LDL 160  EKG personally reviewed by myself on todays visit Sinus tachycardia rate 111 bpm poor R wave progression  Echo: Reviewed  1. Left ventricular ejection fraction, by estimation, is 60 to 65%. The  left ventricle has normal function. The left ventricle has no regional  wall motion abnormalities. The left ventricular internal cavity size was  not well visualized. Left ventricular   diastolic function could not be evaluated.   2. Right ventricular systolic function is low normal. The right  ventricular size is normal. Mildly increased right ventricular wall  thickness. There is moderately elevated pulmonary artery systolic  pressure. The estimated right ventricular systolic  pressure is 99.8 mmHg.   3. Left atrial size was moderately dilated.   4. Right atrial size was mildly dilated.   5. The mitral valve is abnormal. Trivial mitral valve regurgitation.  Moderate to severe mitral stenosis. The mean mitral valve gradient is 8.0  mmHg. Moderate mitral annular calcification.   6. Tricuspid valve  regurgitation is moderate.   7. The aortic valve was not well visualized. Aortic valve regurgitation  is not visualized. No aortic stenosis is present.   8. The inferior vena cava is normal in size with <50% respiratory  variability, suggesting right atrial pressure of 8 mmHg.   Comparison(s): EF 60%, mild LVH, RVSP 78.23mHg.   Covid 02/2019,   Echocardiogram September 2022 Ejection fraction 60 to 633% grade 2 diastolic dysfunction Severely elevated right heart pressures/pulmonary hypertension 78 mmHg  Followed by Dr. GPatsey Bertholdfor her pulmonary issues, asthmatic bronchitis  Hospitalized April 21, 2021 for pancreatitis, abdominal pain  Lab work reviewed A1c improved 6.2 down from 8-10  Dr. SCandiss Norsefollows ESRD  Trying to get out of many of her medications  stopped celexa, stopped statin on her own Reports she is out of her clonidine and metoprolol, taking these But does report blood pressure has been running high at times, not on dialysis days  Prior hospitalization records reviewed  hospitalized 10/15/2020 to 11/10/2020 secondary to gangrene of the right foot with osteomyelitis status post fifth ray amputation and I&D on 8/27 as well as washout procedure 9/1.  She was recommended for IV vancomycin, cefepime for 4 weeks, end date 9/28.  Readmission for CHF in 9/22  On chronic oxygen, 3 liters, followed by gonzalez  Hx of diabetic foot ulcer requiring prolonged period of antibiotics Details per ID notes  August  2022 was in the hospital with right diabetic foot infection with necrosis and osteomyelitis.  She underwent amputation of the fifth ray on 10/21/2020.  This was followed by a washout procedure on 10/26/2020.  As the foot did not appear salvageable amputation was recommended but podiatrist.  She declined.  She took IV vancomycin and cefepime for 8 weeks until 12/19/2020 during dialysis. Diabetic foot infection- necrotic with osteomyelitis- amputation of fifth ray and I&D on  8/27 -Washout procedure on 10/26/2020 -Recommended amputation which patient had declined  -Took  IV vancomycin and cefepime with dialysis -for 8 weeks until 12/19/20 I had seen her on 02/22/2021 and recommended no antibiotics as there was no signs of acute infection. -Repeat MRI February 2023 showing osteo  PMH:   has a past medical history of (HFpEF) heart failure with preserved ejection fraction (HCC), Anxiety, Asthma, Bronchitis, CHF (congestive heart failure) ([place]), Chronic cough, Chronic respiratory failure with hypoxia and hypercapnia ([place]), CKD (chronic kidney disease), stage III ([place]), Depression, Diabetes mellitus without complication ([place]), Diverticulosis, Dyspnea, Dysrhythmia, Environmental and seasonal allergies, Extreme obesity with alveolar hypoventilation ([place]), Hypertension, Iron deficiency anemia, Kidney failure, Orthopnea, Oxygen dependent, Pericardial effusion, Pneumonia, Poorly controlled diabetes mellitus ([place]), and Swelling of both lower extremities.  PSH:    Past Surgical History:  Procedure Laterality Date   A/V FISTULAGRAM Left 06/09/2017   Procedure: A/V FISTULAGRAM;  Surgeon: Algernon Huxley, MD;  Location: [facility];  Service: Cardiovascular;  Laterality: Left;   A/V FISTULAGRAM Left 06/16/2017   Procedure: A/V FISTULAGRAM;  Surgeon: Algernon Huxley, MD;  Location: [facility];  Service: Cardiovascular;  Laterality: Left;   A/V FISTULAGRAM Left 01/28/2018   Procedure: A/V FISTULAGRAM;  Surgeon: Algernon Huxley, MD;  Location: [facility];  Service: Cardiovascular;  Laterality: Left;   AMPUTATION TOE Right 10/21/2020   Procedure: AMPUTATION TOE-5th Digit Amputation Right Foot;  Surgeon: Edrick Kins, DPM;  Location: [facility];  Service: Podiatry;  Laterality: Right;   AV FISTULA PLACEMENT Left 04/25/2017   Procedure: ARTERIOVENOUS (AV) FISTULA CREATION;  Surgeon: Algernon Huxley, MD;  Location: [facility];  Service: Vascular;  Laterality: Left;   BONE BIOPSY  Right 04/27/2021   Procedure: Right 4th metatarsal BONE BIOPSY;  Surgeon: [name], DPM;  Location: [facility];  Service: Podiatry;  Laterality: Right;   CATARACT EXTRACTION W/PHACO Left 01/21/2018   Procedure: CATARACT EXTRACTION PHACO AND INTRAOCULAR LENS PLACEMENT (IOC);  Surgeon: Leandrew Koyanagi, MD;  Location: [facility];  Service: Ophthalmology;  Laterality: Left;  [place] 01:55.5 CDE 9.05 EAUP 13.9 Fluid Pack Lot # [id number]   CESAREAN SECTION     times 3   DIALYSIS/PERMA CATHETER INSERTION Right 04/04/2017   Procedure: DIALYSIS/PERMA CATHETER INSERTION;  Surgeon: Katha Cabal, MD;  Location: [facility];  Service: Cardiovascular;  Laterality: Right;   DIALYSIS/PERMA CATHETER REMOVAL N/A 09/17/2017   Procedure: DIALYSIS/PERMA CATHETER REMOVAL;  Surgeon: Algernon Huxley, MD;  Location: [facility];  Service: Cardiovascular;  Laterality: N/A;   INCISION AND DRAINAGE Right 10/26/2020   Procedure: INCISION AND DRAINAGE;  Surgeon: [name], DPM;  Location: [facility];  Service: Podiatry;  Laterality: Right;   INSERTION OF DIALYSIS CATHETER N/A 04/16/2017   Procedure: INSERTION OF DIALYSIS PERM CATHETER;  Surgeon: Algernon Huxley, MD;  Location: [facility];  Service: Vascular;  Laterality: N/A;   LOWER EXTREMITY ANGIOGRAPHY Right 10/18/2020   Procedure: Lower Extremity Angiography;  Surgeon: Algernon Huxley, MD;  Location: [facility];  Service: Cardiovascular;  Laterality: Right;    Current Outpatient Medications  Medication Sig Dispense Refill  acetaminophen (TYLENOL) 500 MG tablet Take 1,000 mg by mouth every 6 (six) hours as needed for mild pain, fever or headache.     albuterol (PROVENTIL) (2.5 MG/3ML) 0.083% nebulizer solution Take 3 mLs (2.5 mg total) by nebulization every 6 (six) hours as needed for wheezing or shortness of breath. 75 mL 12   albuterol (VENTOLIN HFA) 108 (90 Base) MCG/ACT inhaler Inhale 2 puffs into the lungs every 6 (six) hours as needed for  wheezing or shortness of breath. 9 g 2   aspirin EC 81 MG tablet Take 81 mg by mouth daily.      atorvastatin (LIPITOR) 40 MG tablet Take 1 tablet (40 mg total) by mouth daily. 90 tablet 3   b complex-vitamin c-folic acid (NEPHRO-VITE) 0.8 MG TABS tablet Take 1 tablet by mouth at bedtime.     budesonide (PULMICORT) 0.25 MG/2ML nebulizer solution Take 2 mLs (0.25 mg total) by nebulization 2 (two) times daily. 60 mL 6   budesonide (PULMICORT) 0.5 MG/2ML nebulizer solution Take by nebulization.     Cholecalciferol 100 MCG (4000 UT) CAPS Take 1 capsule by mouth once a week.     clindamycin (CLEOCIN T) 1 % lotion Apply 1 application topically daily.     ferric citrate (AURYXIA) 1 GM 210 MG(Fe) tablet Take 210 mg by mouth 3 (three) times daily with meals. Only taking once a day most days     formoterol (PERFOROMIST) 20 MCG/2ML nebulizer solution      insulin degludec (TRESIBA) 100 UNIT/ML FlexTouch Pen Inject 30 Units into the skin daily.     insulin lispro (HUMALOG) 100 UNIT/ML KwikPen Inject into the skin.     lactulose (CHRONULAC) 10 GM/15ML solution Take 30 mLs (20 g total) by mouth daily as needed for severe constipation. 240 mL 0   lidocaine-prilocaine (EMLA) cream Apply topically as needed (For access for dialysis).     LINZESS 290 MCG CAPS capsule Take 290 mcg by mouth every morning.     metoprolol tartrate (LOPRESSOR) 50 MG tablet Take 1 tablet (50 mg total) by mouth 2 (two) times daily. 180 tablet 3   midodrine (PROAMATINE) 10 MG tablet Take 20 mg by mouth daily as needed.     montelukast (SINGULAIR) 10 MG tablet Take 1 tablet (10 mg total) by mouth at bedtime. 30 tablet 3   Omeprazole Magnesium (PRILOSEC PO)      OXYGEN Inhale 2-4 L into the lungs.     pregabalin (LYRICA) 50 MG capsule Take 100 mg by mouth every 8 (eight) hours as needed.     RENVELA 2.4 g PACK Take 2.4 g by mouth 3 (three) times daily.     revefenacin (YUPELRI) 175 MCG/3ML nebulizer solution      insulin aspart (NOVOLOG)  100 UNIT/ML injection Inject 0-15 Units into the skin 3 (three) times daily with meals. (Patient not taking: Reported on 09/17/2021) 10 mL 11   meloxicam (MOBIC) 15 MG tablet Take 1 tablet (15 mg total) by mouth daily as needed for pain. 30 tablet 2   No current facility-administered medications for this visit.    Allergies:   Dust mite extract, Mold extract [trichophyton], Pollen extract, Sulfa antibiotics, Misc. sulfonamide containing compounds, and Feraheme [ferumoxytol]   Social History:  The patient  reports that she has never smoked. She has been exposed to tobacco smoke. She has never used smokeless tobacco. She reports that she does not currently use alcohol. She reports current drug use. Drug: Marijuana.   Family History:  family history includes Breast cancer (age of onset: 55) in her maternal aunt and maternal aunt; Diabetes in her father, mother, and another family member; Hypertension in her father, mother, and another family member.    Review of Systems: Review of Systems  Constitutional: Negative.   HENT: Negative.    Respiratory:  Positive for shortness of breath.   Cardiovascular: Negative.   Gastrointestinal: Negative.   Musculoskeletal: Negative.   Neurological: Negative.   Psychiatric/Behavioral: Negative.    All other systems reviewed and are negative.   PHYSICAL EXAM: VS:  BP 90/60 (BP Location: Right Arm, Patient Position: Sitting, Cuff Size: Large)   Ht 5' 4.5" (1.638 m)   Wt 265 lb 6 oz (120.4 kg)   LMP 10/14/2012 (Approximate)   SpO2 (!) 89% Comment: 4 Liters of oxygen  BMI 44.85 kg/m  , BMI Body mass index is 44.85 kg/m. Constitutional:  oriented to person, place, and time. No distress.  In a wheelchair HENT:  Head: Grossly normal Eyes:  no discharge. No scleral icterus.  Neck: No JVD, no carotid bruits  Cardiovascular: Regular rate and rhythm, no murmurs appreciated Pulmonary/Chest: Clear to auscultation bilaterally, no wheezes or rails Abdominal:  Soft.  no distension.  no tenderness.  Musculoskeletal: Normal range of motion Neurological:  normal muscle tone. Coordination normal. No atrophy Skin: Skin warm and dry Psychiatric: normal affect, pleasant   Recent Labs: 10/26/2020: Magnesium 2.0 05/01/2021: ALT 18; BUN 66; Creatinine, Ser 10.09; Potassium 5.3; Sodium 128 06/12/2021: Hemoglobin 11.8; Platelets 250    Lipid Panel Lab Results  Component Value Date   CHOL 232 (H) 01/03/2015   HDL 41 01/03/2015   LDLCALC 169 (H) 01/03/2015   TRIG 151 (H) 04/21/2021     Wt Readings from Last 3 Encounters:  10/23/21 265 lb 6 oz (120.4 kg)  09/17/21 275 lb 9.6 oz (125 kg)  09/05/21 269 lb 8 oz (122.2 kg)    ASSESSMENT AND PLAN:   Chronic diastolic congestive heart failure (HCC) - Fluid managed by hemodialysis Echocardiogram in 2022 with markedly elevated right heart pressures greater than 70 Repeat echocardiogram with improved right heart pressures but still moderately elevated 54 In the setting of end-stage renal disease on hemodialysis and moderate to severe mitral valve stenosis  Pulmonary hypertension In the setting of end-stage renal disease on hemodialysis, mitral valve stenosis, obesity hypoventilation Mildly improved right heart pressure numbers on recent echocardiogram  Essential hypertension -  Orthostatic hypotension during dialysis and after treatment Blood pressure running very low during dialysis and following as demonstrated today Recommended midodrine 20 mg before dialysis Tuesday Thursday Saturday, may need midodrine 10 mg at noon following dialysis  Asthma, unspecified asthma severity,  Prior Covid infection Pulmonary hypertension, obesity hypoventilation On oxygen  Morbid obesity due to excess calories ([place]) History of poor diet No regular exercise program  Poorly controlled type 2 diabetes mellitus (HCC) Hemoglobin A1c chronically elevated , though improved recently Improved A1c down to 6.2 recently  February 2023  Anemia Managed by nephrology  Mitral valve stenosis Mean gradient of 8 on echocardiogram, images pulled up and reviewed Thickened leaflets on echo Repeat echocardiogram in 1 year recommended Would be a challenging candidate for surgical intervention Results discussed on today's visit   Total encounter time more than 30 minutes  Greater than 50% was spent in counseling and coordination of care with the patient     Orders Placed This Encounter  Procedures   EKG 12-Lead   ECHOCARDIOGRAM COMPLETE  Signed, Esmond Plants, M.D., Ph.D. 10/23/2021  [place], [place]

## 2021-10-23 ENCOUNTER — Ambulatory Visit: Payer: Medicare Other | Attending: Cardiovascular Disease | Admitting: Cardiovascular Disease

## 2021-10-23 ENCOUNTER — Encounter: Payer: Self-pay | Admitting: Cardiovascular Disease

## 2021-10-23 VITALS — BP 90/60 | Ht 64.5 in | Wt 265.4 lb

## 2021-10-23 DIAGNOSIS — Z794 Long term (current) use of insulin: Secondary | ICD-10-CM | POA: Diagnosis not present

## 2021-10-23 DIAGNOSIS — E1122 Type 2 diabetes mellitus with diabetic chronic kidney disease: Secondary | ICD-10-CM | POA: Diagnosis not present

## 2021-10-23 DIAGNOSIS — I739 Peripheral vascular disease, unspecified: Secondary | ICD-10-CM

## 2021-10-23 DIAGNOSIS — G4733 Obstructive sleep apnea (adult) (pediatric): Secondary | ICD-10-CM

## 2021-10-23 DIAGNOSIS — N186 End stage renal disease: Secondary | ICD-10-CM | POA: Diagnosis not present

## 2021-10-23 DIAGNOSIS — Z992 Dependence on renal dialysis: Secondary | ICD-10-CM | POA: Diagnosis not present

## 2021-10-23 DIAGNOSIS — I5032 Chronic diastolic (congestive) heart failure: Secondary | ICD-10-CM

## 2021-10-23 DIAGNOSIS — I110 Hypertensive heart disease with heart failure: Secondary | ICD-10-CM | POA: Diagnosis not present

## 2021-10-23 DIAGNOSIS — I050 Rheumatic mitral stenosis: Secondary | ICD-10-CM | POA: Diagnosis not present

## 2021-10-23 DIAGNOSIS — I10 Essential (primary) hypertension: Secondary | ICD-10-CM | POA: Diagnosis not present

## 2021-10-23 DIAGNOSIS — I272 Pulmonary hypertension, unspecified: Secondary | ICD-10-CM

## 2021-10-23 DIAGNOSIS — I5033 Acute on chronic diastolic (congestive) heart failure: Secondary | ICD-10-CM

## 2021-10-23 MED ORDER — MELOXICAM 15 MG PO TABS: 15.0000 mg | ORAL_TABLET | Freq: Every day | ORAL | 2 refills | Status: DC | PRN

## 2021-10-23 MED ORDER — ATORVASTATIN CALCIUM 40 MG PO TABS: 40.0000 mg | ORAL_TABLET | Freq: Every day | ORAL | 3 refills | Status: DC

## 2021-10-23 NOTE — Patient Instructions (Addendum)
Echo in one year for mitral valve stenosis, pulmonary hypertension   Medication Instructions:  Please take midodrine 20 mg before HD, and 10 mg at noon on dialysis days   If you need a refill on your cardiac medications before your next appointment, please call your pharmacy.   Lab work: No new labs needed  Testing/Procedures: No new testing needed  Follow-Up: At [facility], you and your health needs are our priority.  As part of our continuing mission to provide you with exceptional heart care, we have created designated Provider Care Teams.  These Care Teams include your primary Cardiologist (physician) and Advanced Practice Providers (APPs -  Physician Assistants and Nurse Practitioners) who all work together to provide you with the care you need, when you need it.  You will need a follow up appointment in 3 months  Providers on your designated Care Team:   [name] Hodgkins, NP [name] Faith, PA-C [name], [place]  COVID-19 Vaccine Information can be found at: [email or web address] For questions related to vaccine distribution or appointments, please email [email or web address] or call [phone].

## 2021-10-24 DIAGNOSIS — N186 End stage renal disease: Secondary | ICD-10-CM | POA: Diagnosis not present

## 2021-10-24 DIAGNOSIS — E11621 Type 2 diabetes mellitus with foot ulcer: Secondary | ICD-10-CM | POA: Diagnosis not present

## 2021-10-24 DIAGNOSIS — I5032 Chronic diastolic (congestive) heart failure: Secondary | ICD-10-CM | POA: Diagnosis not present

## 2021-10-24 DIAGNOSIS — Z9981 Dependence on supplemental oxygen: Secondary | ICD-10-CM | POA: Diagnosis not present

## 2021-10-24 DIAGNOSIS — Z992 Dependence on renal dialysis: Secondary | ICD-10-CM | POA: Diagnosis not present

## 2021-10-24 DIAGNOSIS — D631 Anemia in chronic kidney disease: Secondary | ICD-10-CM | POA: Diagnosis not present

## 2021-10-24 DIAGNOSIS — Z794 Long term (current) use of insulin: Secondary | ICD-10-CM | POA: Diagnosis not present

## 2021-10-24 DIAGNOSIS — L97513 Non-pressure chronic ulcer of other part of right foot with necrosis of muscle: Secondary | ICD-10-CM | POA: Diagnosis not present

## 2021-10-24 DIAGNOSIS — E1151 Type 2 diabetes mellitus with diabetic peripheral angiopathy without gangrene: Secondary | ICD-10-CM | POA: Diagnosis not present

## 2021-10-24 DIAGNOSIS — I132 Hypertensive heart and chronic kidney disease with heart failure and with stage 5 chronic kidney disease, or end stage renal disease: Secondary | ICD-10-CM | POA: Diagnosis not present

## 2021-10-24 DIAGNOSIS — E1122 Type 2 diabetes mellitus with diabetic chronic kidney disease: Secondary | ICD-10-CM | POA: Diagnosis not present

## 2021-10-25 DIAGNOSIS — J449 Chronic obstructive pulmonary disease, unspecified: Secondary | ICD-10-CM | POA: Diagnosis not present

## 2021-10-25 DIAGNOSIS — N186 End stage renal disease: Secondary | ICD-10-CM | POA: Diagnosis not present

## 2021-10-25 DIAGNOSIS — Z992 Dependence on renal dialysis: Secondary | ICD-10-CM | POA: Diagnosis not present

## 2021-10-26 ENCOUNTER — Encounter: Payer: Medicare Other | Attending: Physician Assistant | Admitting: Physician Assistant

## 2021-10-26 DIAGNOSIS — I5040 Unspecified combined systolic (congestive) and diastolic (congestive) heart failure: Secondary | ICD-10-CM | POA: Diagnosis not present

## 2021-10-26 DIAGNOSIS — M86371 Chronic multifocal osteomyelitis, right ankle and foot: Secondary | ICD-10-CM | POA: Diagnosis not present

## 2021-10-26 DIAGNOSIS — Z9981 Dependence on supplemental oxygen: Secondary | ICD-10-CM | POA: Diagnosis not present

## 2021-10-26 DIAGNOSIS — I5042 Chronic combined systolic (congestive) and diastolic (congestive) heart failure: Secondary | ICD-10-CM | POA: Insufficient documentation

## 2021-10-26 DIAGNOSIS — J45909 Unspecified asthma, uncomplicated: Secondary | ICD-10-CM | POA: Diagnosis not present

## 2021-10-26 DIAGNOSIS — E11621 Type 2 diabetes mellitus with foot ulcer: Secondary | ICD-10-CM | POA: Diagnosis not present

## 2021-10-26 DIAGNOSIS — E1122 Type 2 diabetes mellitus with diabetic chronic kidney disease: Secondary | ICD-10-CM | POA: Diagnosis not present

## 2021-10-26 DIAGNOSIS — N186 End stage renal disease: Secondary | ICD-10-CM | POA: Insufficient documentation

## 2021-10-26 DIAGNOSIS — Z992 Dependence on renal dialysis: Secondary | ICD-10-CM | POA: Insufficient documentation

## 2021-10-26 DIAGNOSIS — L97514 Non-pressure chronic ulcer of other part of right foot with necrosis of bone: Secondary | ICD-10-CM | POA: Insufficient documentation

## 2021-10-26 DIAGNOSIS — E1169 Type 2 diabetes mellitus with other specified complication: Secondary | ICD-10-CM | POA: Diagnosis not present

## 2021-10-26 DIAGNOSIS — L97513 Non-pressure chronic ulcer of other part of right foot with necrosis of muscle: Secondary | ICD-10-CM | POA: Diagnosis not present

## 2021-10-26 DIAGNOSIS — I132 Hypertensive heart and chronic kidney disease with heart failure and with stage 5 chronic kidney disease, or end stage renal disease: Secondary | ICD-10-CM | POA: Diagnosis not present

## 2021-10-26 NOTE — Progress Notes (Addendum)
[name] [name] ([id number]) Visit Report for 10/26/2021 Chief Complaint Document Details Patient Name: [name] [name] [name] Date of Service: 10/26/2021 12:45 PM Medical Record Number: [id number] Patient Account Number: [id number] Date of Birth/Sex: February 21, 1971 (51 y.o. F) Treating RN: [name] [name] Primary Care Provider: Evern [name] Other Clinician: Massie [name] Referring Provider: Evern [name] Treating Provider/Extender: [name] Weeks in Treatment: 23 Information Obtained from: Patient Chief Complaint Right foot gangrene and osteomyelitis Electronic Signature(s) Signed: 10/26/2021 12:40:49 PM By: Worthy Keeler [name] Entered By: Worthy Keeler on 10/26/2021 12:40:49 [name] [name], [name] [name] ([id number]) -------------------------------------------------------------------------------- Debridement Details Patient Name: [name] [name] [name]. Date of Service: 10/26/2021 12:45 PM Medical Record Number: [id number] Patient Account Number: [id number] Date of Birth/Sex: 05-15-1970 (50 y.o. F) Treating RN: [name] [name] Primary Care Provider: Evern [name] Other Clinician: Massie [name] Referring Provider: Evern [name] Treating Provider/Extender: [name] Weeks in Treatment: 23 Debridement Performed for Wound #2 Right,Circumferential Foot Assessment: Performed By: Physician [name] [name] Debridement Type: Debridement Severity of Tissue Pre Debridement: Necrosis of muscle Level of Consciousness (Pre- Awake and Alert procedure): Pre-procedure Verification/Time Out Yes - 13:35 Taken: Start Time: 13:35 Total Area Debrided (L x W): 4 (cm) x 5 (cm) = 20 (cm) Tissue and other material Non-Viable, Eschar debrided: Level: Non-Viable Tissue Debridement Description: Selective/Open Wound Instrument: Scissors Bleeding: Minimum Hemostasis Achieved: Pressure End Time: 13:43 Response to Treatment: Procedure was tolerated well Level of Consciousness (Post- Awake and  Alert procedure): Post Debridement Measurements of Total Wound Length: (cm) 12.2 Width: (cm) 14 Depth: (cm) 0.4 Volume: (cm) 53.658 Character of Wound/Ulcer Post Debridement: Stable Severity of Tissue Post Debridement: Necrosis of muscle Post Procedure Diagnosis Same as Pre-procedure Electronic Signature(s) Signed: 10/26/2021 1:57:25 PM By: Worthy Keeler [name] Signed: 10/26/2021 2:22:01 PM By: [name] [name] Signed: 10/26/2021 3:00:13 PM By: [name] [name] Entered By: [name] [name] on 10/26/2021 13:45:32 [name] [name], [name] [name] ([id number]) -------------------------------------------------------------------------------- HPI Details Patient Name: [name] [name], [name] [name]. Date of Service: 10/26/2021 12:45 PM Medical Record Number: [id number] Patient Account Number: [id number] Date of Birth/Sex: [birth year] (51 y.o. F) Treating RN: [name] [name] Primary Care Provider: Evern [name] Other Clinician: Massie [name] Referring Provider: Evern [name] Treating Provider/Extender: [name] [name] in Treatment: 23 History of Present Illness HPI Description: 10/04/2020 upon evaluation today patient presents for initial inspection here in the clinic concerning a fairly concerning wound over her right fifth toe towards the medial aspect which actually probes all the way down to bone. There is evidence of necrotic muscle as well and I do see tendon exposed as well. Subsequently this does have me very concerned about where things stand here. Again this is the first time that I am seeing her and on top of everything else her ABIs are noncompressible. I think that arterial flow is the primary concern here. Of note the patient states this was first noted July 1. She had a x-ray on July 4 which was negative for bone infection. She has been on 2 antibiotics she is unsure of the name. With that being said her most recent hemoglobin A1c was 8.1 that was December 2022. She otherwise does have a history  obviously of diabetes mellitus type 2, hypertension, end-stage renal disease with dependence on renal dialysis, and she is also dependent on supplemental oxygen. Readmission: 05/14/2021 this is a patient whom I previously seen October 04, 2020. With that being said she is subsequently on 21 October 2020 ended up having an amputation of her fifth digit of her foot  unfortunately. This had progressed fairly quickly into what sounds to been gangrene at that time. Subsequently right now based on what I am seeing the patient actually has a necrotic area on the end of her foot and has been recommended that the only option really here is going to be a amputation which would be a below-knee amputation. She is really not interested in that and wants to try to give this every chance it can to heal. For that reason I discussed with her that definitely we can see what we can do to try to improve this situation and try to help get this doing better as best we can. With that being said there is definitely a portion of her wound that is just not good to be able to heal no matter what it is already starting to auto amputate. This includes potentially digits of her foot as well based on what I am seeing. She does have confirmed osteomyelitis noted by MRI. She is also currently on IV vancomycin. She does undergo dialysis on Tuesday, Thursday, and Saturday. The patient does have a history of diabetes mellitus type 2, congestive heart failure, hypertension, end-stage renal disease, dependence on renal dialysis, and dependence on supplemental oxygen. With that being said I am going to need to look into her heart failure and the significance of this as far as hyperbarics is concerned before we initiate treatment. She is probably also going to need an updated EKG and chest x-ray unless its already been done recently. 05/28/2021 upon evaluation today patient appears to be doing well with regard to her wound all things considered.  I am going to try to see what we can do about trimmed away some of the necrotic tissue that is really just hanging on here and there and not really helpful. Feel like if we can slowly start doing this maybe we will be able to get to the point where this is better and better as far as cleaning up the surface of the wound is concerned. Still as I explained I do believe some of her toes are not salvageable but again she is looking more towards autoamputation versus want to do anything definitive here from a surgical standpoint. I have been researching whether she would be an okay candidate for hyperbarics or not and I found that her x-ray but she had a chest x-ray recently was negative for any acute disease and appear to be stable. EKG was also stable. She did also have a cardiac echo which showed that her ejection fraction was 60 to 65% and therefore I think she is a candidate for hyperbarics. Again I do believe that this can help with preventing amputation which right now she has been told that her only option would be a below-knee amputation. Nonetheless I think that if we can get the heel leading to spread and the majority of this cleared away that it is possible we might even be able to get a surgeon just to surgically remove the nonviable toes and not have to go the full way of complete amputation. Nonetheless we do have a ways to go before we will get to that point. Either way I think the hyperbarics could be an excellent support as far as that is concerned. 06-05-2021 upon evaluation today patient's wound is showing some signs of improvement. This does seem to be slow and of course it is understandable considering what we are seeing. She has discussed and looked up information about hyperbarics and  in the end comes back today and states that she does want to proceed. I honestly do feel like that is her best chance at saving the foot. Even if we are able to get her into a surgeon after we obtain  some good healing approximately so that there is not as much necrotic tissue in general she may even be able to have a transmetatarsal amputation and get this to heal. Again I am not saying this is can be an easy road but it may be her best road as far as thinking about how to achieve some type of complete healing as I do not believe the toes are viable at all to be honest. 07-06-2021 upon evaluation today patient appears to be doing well currently in regard to her foot all things considered. Again she unfortunately was unable to do the hyperbaric oxygen therapy this was due to her claustrophobia. Nonetheless she tells me that she is pleased with how the foot appears and she does feel like there is good improvement going on here. In general I think that we are on the right track. If she is going to take a very long time and to be honest the route of autoamputation which is pretty much the way this is going is not a quick or easy road but nonetheless she seems to be taking care of her foot quite well. She is in good spirits. 07-27-2021 upon evaluation today patient appears to be doing well currently in regard to her foot all things considered. A lot of the eschar is starting to lift up around the edges which is good there is still significant necrotic tissue on the distal portion of the foot. This is slowly going to work its way off. Based on what I am seeing currently I do not believe that there is any evidence of infection which is good news. 08-31-2021 upon evaluation today patient's wound actually appears to be doing decently well. I do not see anything in particular to try to trim away today. With that being said she continues to slowly allow this to do what is going to do as far as autoamputation is concerned. I did have another conversation with her today about the possibility of seeing a surgeon for a transmetatarsal amputation which again would help to get this to close faster and obviously I think  would be a much speedy year and safer way to proceed with amputation. With that being said she tells me that she would really prefer to just let this go at "God pace". 09-28-2021 upon evaluation today patient's foot actually appears to be doing much better. Fortunately I do not see any signs of infection which is great news and overall I am extremely pleased with where we stand today. There does not appear to be any signs of active infection locally or Escandon, Isbella M. ([id number]) systemically which is great news. No fevers, chills, nausea, vomiting, or diarrhea. 10-26-2021 upon evaluation today patient appears to be doing well currently in regard to her foot all things considered. Fortunately I do not see any evidence of active infection locally or systemically at this time which is great news. No fevers, chills, nausea, vomiting, or diarrhea. She does have some of the eschar that is lifting up even a little bit more compared to last time I saw her. Electronic Signature(s) Signed: 10/26/2021 1:53:45 PM By: Worthy Keeler [name] Entered By: Worthy Keeler on 10/26/2021 13:53:45 [name] [name], [name] [name] ([id number]) -------------------------------------------------------------------------------- Physical Exam Details  Patient Name: [name] [name], [name] [name]. Date of Service: 10/26/2021 12:45 PM Medical Record Number: [id number] Patient Account Number: [id number] Date of Birth/Sex: December 01, 1970 (51 y.o. F) Treating RN: [name] [name] Primary Care Provider: Evern [name] Other Clinician: Massie [name] Referring Provider: Evern [name] Treating Provider/Extender: [name] Weeks in Treatment: 23 Constitutional Obese and well-hydrated in no acute distress. Respiratory normal breathing without difficulty. Psychiatric this patient is able to make decisions and demonstrates good insight into disease process. Alert and Oriented x 3. pleasant and cooperative. Notes Upon inspection patient's wound actually again does have  some eschar to clearway on the edges of this area. She in the past has done well with trimming some of this away and little by little we will try to make little progress as far as getting this completely healed. Overall I think that she is doing quite well all things considered again we have multiple conversations about going for surgery to remove the necrotic portion of her foot but she does not want to do that therefore we are progressing towards autoamputation. Electronic Signature(s) Signed: 10/26/2021 1:54:17 PM By: Worthy Keeler [name] Entered By: Worthy Keeler on 10/26/2021 13:54:17 [name] [name], [name] [name] ([id number]) -------------------------------------------------------------------------------- Physician Orders Details Patient Name: Smick, [name] [name]. Date of Service: 10/26/2021 12:45 PM Medical Record Number: [id number] Patient Account Number: [id number] Date of Birth/Sex: Mar 04, 1970 (51 y.o. F) Treating RN: [name] [name] Primary Care Provider: Evern [name] Other Clinician: Massie [name] Referring Provider: Evern [name] Treating Provider/Extender: [name] [name] in Treatment: 23 Verbal / Phone Orders: No Diagnosis Coding ICD-10 Coding Code Description M86.371 Chronic multifocal osteomyelitis, right ankle and foot E11.621 Type 2 diabetes mellitus with foot ulcer L97.514 Non-pressure chronic ulcer of other part of right foot with necrosis of bone I50.42 Chronic combined systolic (congestive) and diastolic (congestive) heart failure I10 Essential (primary) hypertension N18.6 End stage renal disease Z99.2 Dependence on renal dialysis Z99.81 Dependence on supplemental oxygen Follow-up Appointments o Return Appointment in 3 weeks. o Nurse Visit as needed [facility] for wound care. May utilize formulary equivalent dressing for wound treatment orders unless otherwise specified. Home Health Nurse may visit PRN to  address patientos wound care needs. o Scheduled days for dressing changes to be completed; exception, patient has scheduled wound care visit that day. o **Please direct any NON-WOUND related issues/requests for orders to patient's Primary Care Physician. **If current dressing causes regression in wound condition, may D/C ordered dressing product/s and apply Normal Saline Moist Dressing daily until next Richardson or Other MD appointment. **Notify [facility] of regression in wound condition at [phone]. Bathing/ Shower/ Hygiene o Clean wound with Normal Saline or wound cleanser. o Wash wounds with antibacterial soap and water. o May shower; gently cleanse wound with antibacterial soap, rinse and pat dry prior to dressing wounds o No tub bath. Off-Loading o Open toe surgical shoe - provided at visit Hyperbaric Oxygen Therapy o Evaluate for HBO Therapy o Indication and location: - right foot o If appropriate for treatment, begin HBOT per protocol: o 2.5 ATA for 90 Minutes with 2 Five (5) Minute Air Breaks o One treatment per day (delivered Monday through Friday unless otherwise specified in Special Instructions below): o Total # of Treatments: - 40 o Finger stick Blood Glucose Pre- and Post- HBOT Treatment. o Follow Hyperbaric Oxygen Glycemia Protocol Wound Treatment Wound #2 - Foot Wound Laterality: Right, Circumferential Cleanser: Dakin 16 (oz) 0.25 1 x Per Day/30  Days Discharge Instructions: wet/dry in open areas of foot. Cleanser: Normal Saline 1 x Per Day/30 Days Discharge Instructions: Wash your hands with soap and water. Remove old dressing, discard into plastic bag and place into trash. Cleanse the wound with Normal Saline prior to applying a clean dressing using gauze sponges, not tissues or cotton balls. Do not scrub or use excessive force. Pat dry using gauze sponges, not tissue or cotton balls. Cleanser: Soap and Water 1 x  Per Day/30 Days [name] [name], [name] [name]. ([id number]) Discharge Instructions: Gently cleanse wound with antibacterial soap, rinse and pat dry prior to dressing wounds Topical: Betadine 1 x Per Day/30 Days Discharge Instructions: Apply betadine as directed. apply to gauze and pack sites and lay op open areas to top of foot Primary Dressing: Gauze 1 x Per Day/30 Days Discharge Instructions: As directed: betadine applied Secondary Dressing: ABD Pad 5x9 (in/in) 1 x Per Day/30 Days Discharge Instructions: Cover with ABD pad Secured With: Medipore Tape - 24M Medipore H Soft Cloth Surgical Tape, 2x2 (in/yd) 1 x Per Day/30 Days Secured With: Kerlix Roll Sterile or Non-Sterile 6-ply 4.5x4 (yd/yd) 1 x Per Day/30 Days Discharge Instructions: Apply Kerlix as directed Secured With: [employer] Dressing, Latex-free, Size 5, Small-Head / Shoulder / Thigh 1 x Per Day/30 Days GLYCEMIA INTERVENTIONS PROTOCOL PRE-HBO GLYCEMIA INTERVENTIONS ACTION INTERVENTION Obtain pre-HBO capillary blood glucose (ensure 1 physician order is in chart). A. Notify HBO physician and await physician orders. 2 If result is 70 mg/dl or below: B. If the result meets the hospital definition of a critical result, follow hospital policy. A. Give patient an 8 ounce Glucerna Shake, an 8 ounce Ensure, or 8 ounces of a Glucerna/Ensure equivalent dietary supplement*. B. Wait 30 minutes. If result is 71 mg/dl to 130 mg/dl: C. Retest patientos capillary blood glucose (CBG). D. If result greater than or equal to 110 mg/dl, proceed with HBO. If result less than 110 mg/dl, notify HBO physician and consider holding HBO. If result is 131 mg/dl to 249 mg/dl: A. Proceed with HBO. A. Notify HBO physician and await physician orders. B. It is recommended to hold HBO and do If result is 250 mg/dl or greater: blood/urine ketone testing. C. If the result meets the hospital definition of a critical result, follow hospital policy. POST-HBO  GLYCEMIA INTERVENTIONS ACTION INTERVENTION Obtain post HBO capillary blood glucose (ensure 1 physician order is in chart). A. Notify HBO physician and await physician orders. 2 If result is 70 mg/dl or below: B. If the result meets the hospital definition of a critical result, follow hospital policy. A. Give patient an 8 ounce Glucerna Shake, an 8 ounce Ensure, or 8 ounces of a Glucerna/Ensure equivalent dietary supplement*. B. Wait 15 minutes for symptoms of hypoglycemia (i.e. nervousness, anxiety, If result is 71 mg/dl to 100 mg/dl: sweating, chills, clamminess, irritability, confusion, tachycardia or dizziness). C. If patient asymptomatic, discharge patient. If patient symptomatic, repeat capillary blood glucose (CBG) and notify HBO physician. If result is 101 mg/dl to 249 mg/dl: A. Discharge patient. A. Notify HBO physician and await physician orders. B. It is recommended to do blood/urine If result is 250 mg/dl or greater: ketone testing. C. If the result meets the hospital definition of a critical result, follow hospital policy. [name] [name], [name] [name]. ([id number]) *Juice or candies are NOT equivalent products. If patient refuses the Glucerna or Ensure, please consult the hospital dietitian for an appropriate substitute. Electronic Signature(s) Signed: 10/26/2021 1:57:25 PM By: Worthy Keeler [name] Signed: 10/26/2021 2:22:01 PM  By: [name] [name] Entered By: [name] [name] on 10/26/2021 13:47:00 [name] [name], [name] [name] ([id number]) -------------------------------------------------------------------------------- Problem List Details Patient Name: Delarocha, [name] [name]. Date of Service: 10/26/2021 12:45 PM Medical Record Number: [id number] Patient Account Number: [id number] Date of Birth/Sex: 17-Sep-1970 (51 y.o. F) Treating RN: [name] [name] Primary Care Provider: Evern [name] Other Clinician: Massie [name] Referring Provider: Evern [name] Treating Provider/Extender: [name] Weeks in Treatment: 23 Active Problems ICD-10 Encounter Code Description Active Date MDM Diagnosis M86.371 Chronic multifocal osteomyelitis, right ankle and foot 05/18/2021 No Yes E11.621 Type 2 diabetes mellitus with foot ulcer 05/18/2021 No Yes L97.514 Non-pressure chronic ulcer of other part of right foot with necrosis of 05/18/2021 No Yes bone I50.42 Chronic combined systolic (congestive) and diastolic (congestive) heart 05/18/2021 No Yes failure I10 Essential (primary) hypertension 05/18/2021 No Yes N18.6 End stage renal disease 05/18/2021 No Yes Z99.2 Dependence on renal dialysis 05/18/2021 No Yes Z99.81 Dependence on supplemental oxygen 05/18/2021 No Yes Inactive Problems Resolved Problems Electronic Signature(s) Signed: 10/26/2021 12:40:36 PM By: Worthy Keeler [name] Entered By: Worthy Keeler on 10/26/2021 12:40:35 [name] [name], [name] [name] ([id number]) -------------------------------------------------------------------------------- Progress Note Details Patient Name: Brocksmith, [name] [name]. Date of Service: 10/26/2021 12:45 PM Medical Record Number: [id number] Patient Account Number: [id number] Date of Birth/Sex: 11-13-1970 (51 y.o. F) Treating RN: [name] [name] Primary Care Provider: Evern [name] Other Clinician: Massie [name] Referring Provider: Evern [name] Treating Provider/Extender: [name] [name] in Treatment: 23 Subjective Chief Complaint Information obtained from Patient Right foot gangrene and osteomyelitis History of Present Illness (HPI) 10/04/2020 upon evaluation today patient presents for initial inspection here in the clinic concerning a fairly concerning wound over her right fifth toe towards the medial aspect which actually probes all the way down to bone. There is evidence of necrotic muscle as well and I do see tendon exposed as well. Subsequently this does have me very concerned about where things stand here. Again this is the first time that I am seeing her and  on top of everything else her ABIs are noncompressible. I think that arterial flow is the primary concern here. Of note the patient states this was first noted July 1. She had a x-ray on July 4 which was negative for bone infection. She has been on 2 antibiotics she is unsure of the name. With that being said her most recent hemoglobin A1c was 8.1 that was December 2022. She otherwise does have a history obviously of diabetes mellitus type 2, hypertension, end-stage renal disease with dependence on renal dialysis, and she is also dependent on supplemental oxygen. Readmission: 05/14/2021 this is a patient whom I previously seen October 04, 2020. With that being said she is subsequently on 21 October 2020 ended up having an amputation of her fifth digit of her foot unfortunately. This had progressed fairly quickly into what sounds to been gangrene at that time. Subsequently right now based on what I am seeing the patient actually has a necrotic area on the end of her foot and has been recommended that the only option really here is going to be a amputation which would be a below-knee amputation. She is really not interested in that and wants to try to give this every chance it can to heal. For that reason I discussed with her that definitely we can see what we can do to try to improve this situation and try to help get this doing better as best we can. With that being said there is definitely a portion of her wound that  is just not good to be able to heal no matter what it is already starting to auto amputate. This includes potentially digits of her foot as well based on what I am seeing. She does have confirmed osteomyelitis noted by MRI. She is also currently on IV vancomycin. She does undergo dialysis on Tuesday, Thursday, and Saturday. The patient does have a history of diabetes mellitus type 2, congestive heart failure, hypertension, end-stage renal disease, dependence on renal dialysis, and  dependence on supplemental oxygen. With that being said I am going to need to look into her heart failure and the significance of this as far as hyperbarics is concerned before we initiate treatment. She is probably also going to need an updated EKG and chest x-ray unless its already been done recently. 05/28/2021 upon evaluation today patient appears to be doing well with regard to her wound all things considered. I am going to try to see what we can do about trimmed away some of the necrotic tissue that is really just hanging on here and there and not really helpful. Feel like if we can slowly start doing this maybe we will be able to get to the point where this is better and better as far as cleaning up the surface of the wound is concerned. Still as I explained I do believe some of her toes are not salvageable but again she is looking more towards autoamputation versus want to do anything definitive here from a surgical standpoint. I have been researching whether she would be an okay candidate for hyperbarics or not and I found that her x-ray but she had a chest x-ray recently was negative for any acute disease and appear to be stable. EKG was also stable. She did also have a cardiac echo which showed that her ejection fraction was 60 to 65% and therefore I think she is a candidate for hyperbarics. Again I do believe that this can help with preventing amputation which right now she has been told that her only option would be a below-knee amputation. Nonetheless I think that if we can get the heel leading to spread and the majority of this cleared away that it is possible we might even be able to get a surgeon just to surgically remove the nonviable toes and not have to go the full way of complete amputation. Nonetheless we do have a ways to go before we will get to that point. Either way I think the hyperbarics could be an excellent support as far as that is concerned. 06-05-2021 upon evaluation  today patient's wound is showing some signs of improvement. This does seem to be slow and of course it is understandable considering what we are seeing. She has discussed and looked up information about hyperbarics and in the end comes back today and states that she does want to proceed. I honestly do feel like that is her best chance at saving the foot. Even if we are able to get her into a surgeon after we obtain some good healing approximately so that there is not as much necrotic tissue in general she may even be able to have a transmetatarsal amputation and get this to heal. Again I am not saying this is can be an easy road but it may be her best road as far as thinking about how to achieve some type of complete healing as I do not believe the toes are viable at all to be honest. 07-06-2021 upon evaluation today patient appears to  be doing well currently in regard to her foot all things considered. Again she unfortunately was unable to do the hyperbaric oxygen therapy this was due to her claustrophobia. Nonetheless she tells me that she is pleased with how the foot appears and she does feel like there is good improvement going on here. In general I think that we are on the right track. If she is going to take a very long time and to be honest the route of autoamputation which is pretty much the way this is going is not a quick or easy road but nonetheless she seems to be taking care of her foot quite well. She is in good spirits. 07-27-2021 upon evaluation today patient appears to be doing well currently in regard to her foot all things considered. A lot of the eschar is starting to lift up around the edges which is good there is still significant necrotic tissue on the distal portion of the foot. This is slowly going to work its way off. Based on what I am seeing currently I do not believe that there is any evidence of infection which is good news. 08-31-2021 upon evaluation today patient's wound  actually appears to be doing decently well. I do not see anything in particular to try to trim away today. With that being said she continues to slowly allow this to do what is going to do as far as autoamputation is concerned. I did have another conversation with her today about the possibility of seeing a surgeon for a transmetatarsal amputation which again would help to get this to close [name] [name], [name] [name] ([id number]) faster and obviously I think would be a much speedy year and safer way to proceed with amputation. With that being said she tells me that she would really prefer to just let this go at "God pace". 09-28-2021 upon evaluation today patient's foot actually appears to be doing much better. Fortunately I do not see any signs of infection which is great news and overall I am extremely pleased with where we stand today. There does not appear to be any signs of active infection locally or systemically which is great news. No fevers, chills, nausea, vomiting, or diarrhea. 10-26-2021 upon evaluation today patient appears to be doing well currently in regard to her foot all things considered. Fortunately I do not see any evidence of active infection locally or systemically at this time which is great news. No fevers, chills, nausea, vomiting, or diarrhea. She does have some of the eschar that is lifting up even a little bit more compared to last time I saw her. Objective Constitutional Obese and well-hydrated in no acute distress. Vitals Time Taken: 1:13 PM, Height: 64 in, Weight: 259 lbs, BMI: 44.5, Temperature: 98.3 F, Pulse: 78 bpm, Respiratory Rate: 18 breaths/min, Blood Pressure: 106/83 mmHg. Respiratory normal breathing without difficulty. Psychiatric this patient is able to make decisions and demonstrates good insight into disease process. Alert and Oriented x 3. pleasant and cooperative. General Notes: Upon inspection patient's wound actually again does have some eschar to clearway on  the edges of this area. She in the past has done well with trimming some of this away and little by little we will try to make little progress as far as getting this completely healed. Overall I think that she is doing quite well all things considered again we have multiple conversations about going for surgery to remove the necrotic portion of her foot but she does not want to do that  therefore we are progressing towards autoamputation. Integumentary (Hair, Skin) Wound #2 status is Open. Original cause of wound was Gradually Appeared. The date acquired was: 10/15/2020. The wound has been in treatment 23 weeks. The wound is located on the Right,Circumferential Foot. The wound measures 12.2cm length x 14cm width x 0.4cm depth; 134.146cm^2 area and 53.658cm^3 volume. There is a medium amount of serosanguineous drainage noted. Wound #2 status is Open. Original cause of wound was Gradually Appeared. The date acquired was: 10/15/2020. The wound has been in treatment 23 weeks. The wound is located on the Right,Circumferential Foot. The wound measures 12.2cm length x 14cm width x 0.4cm depth; 134.146cm^2 area and 53.658cm^3 volume. There is a medium amount of serosanguineous drainage noted. Assessment Active Problems ICD-10 Chronic multifocal osteomyelitis, right ankle and foot Type 2 diabetes mellitus with foot ulcer Non-pressure chronic ulcer of other part of right foot with necrosis of bone Chronic combined systolic (congestive) and diastolic (congestive) heart failure Essential (primary) hypertension End stage renal disease Dependence on renal dialysis Dependence on supplemental oxygen Procedures Wound #2 Pre-procedure diagnosis of Wound #2 is a Diabetic Wound/Ulcer of the Lower Extremity located on the Right,Circumferential Foot .Severity of [place], Kaiyana M. ([id number]) Tissue Pre Debridement is: Necrosis of muscle. There was a Selective/Open Wound Non-Viable Tissue Debridement with a total  area of 20 sq cm performed by [name] [name]. With the following instrument(s): Scissors to remove Non-Viable tissue/material. Material removed includes Eschar. A time out was conducted at 13:35, prior to the start of the procedure. A Minimum amount of bleeding was controlled with Pressure. The procedure was tolerated well. Post Debridement Measurements: 12.2cm length x 14cm width x 0.4cm depth; 53.658cm^3 volume. Character of Wound/Ulcer Post Debridement is stable. Severity of Tissue Post Debridement is: Necrosis of muscle. Post procedure Diagnosis Wound #2: Same as Pre-Procedure Plan Follow-up Appointments: Return Appointment in 3 weeks. Nurse Visit as needed Home Health: [facility] for wound care. May utilize formulary equivalent dressing for wound treatment orders unless otherwise specified. Home Health Nurse may visit PRN to address patient s wound care needs. Scheduled days for dressing changes to be completed; exception, patient has scheduled wound care visit that day. **Please direct any NON-WOUND related issues/requests for orders to patient's Primary Care Physician. **If current dressing causes regression in wound condition, may D/C ordered dressing product/s and apply Normal Saline Moist Dressing daily until next [place] or Other MD appointment. **Notify [facility] of regression in wound condition at [phone]. Bathing/ Shower/ Hygiene: Clean wound with Normal Saline or wound cleanser. Wash wounds with antibacterial soap and water. May shower; gently cleanse wound with antibacterial soap, rinse and pat dry prior to dressing wounds No tub bath. Off-Loading: Open toe surgical shoe - provided at visit Hyperbaric Oxygen Therapy: Evaluate for HBO Therapy Indication and location: - right foot If appropriate for treatment, begin HBOT per protocol: 2.5 ATA for 90 Minutes with 2 Five (5) Minute Air Breaks One  treatment per day (delivered Monday through Friday unless otherwise specified in Special Instructions below): Total # of Treatments: - 40 Finger stick Blood Glucose Pre- and Post- HBOT Treatment. Follow Hyperbaric Oxygen Glycemia Protocol WOUND #2: - Foot Wound Laterality: Right, Circumferential Cleanser: Dakin 16 (oz) 0.25 1 x Per Day/30 Days Discharge Instructions: wet/dry in open areas of foot. Cleanser: Normal Saline 1 x Per Day/30 Days Discharge Instructions: Wash your hands with soap and water. Remove old dressing, discard into plastic bag  and place into trash. Cleanse the wound with Normal Saline prior to applying a clean dressing using gauze sponges, not tissues or cotton balls. Do not scrub or use excessive force. Pat dry using gauze sponges, not tissue or cotton balls. Cleanser: Soap and Water 1 x Per Day/30 Days Discharge Instructions: Gently cleanse wound with antibacterial soap, rinse and pat dry prior to dressing wounds Topical: Betadine 1 x Per Day/30 Days Discharge Instructions: Apply betadine as directed. apply to gauze and pack sites and lay op open areas to top of foot Primary Dressing: Gauze 1 x Per Day/30 Days Discharge Instructions: As directed: betadine applied Secondary Dressing: ABD Pad 5x9 (in/in) 1 x Per Day/30 Days Discharge Instructions: Cover with ABD pad Secured With: Medipore Tape - 61M Medipore H Soft Cloth Surgical Tape, 2x2 (in/yd) 1 x Per Day/30 Days Secured With: Kerlix Roll Sterile or Non-Sterile 6-ply 4.5x4 (yd/yd) 1 x Per Day/30 Days Discharge Instructions: Apply Kerlix as directed Secured With: [employer] Dressing, Latex-free, Size 5, Small-Head / Shoulder / Thigh 1 x Per Day/30 Days 1. I would recommend currently that we go ahead and continue with the recommendation for wound care measures as before when changes we can use some Dakin's moistened gauze around the open areas which are draining more. 2. I am also going to recommend continue with the  Betadine moistened gauze to the eschar region. 3. I am also going to recommend that we have the patient continue to monitor for any signs of worsening or infection. Right now I do not see anything that appears to be obviously infected currently. We will see patient back for reevaluation in 1 Month here in the clinic. If anything worsens or changes patient will contact our office for additional recommendations. Electronic Signature(s) Signed: 10/26/2021 1:55:00 PM By: Worthy Keeler [name] Christiana, [place]. ([id number]) Entered By: Worthy Keeler on 10/26/2021 13:54:59 Ruhland, [name] [name] ([id number]) -------------------------------------------------------------------------------- SuperBill Details Patient Name: Ketchem, [name] [name]. Date of Service: 10/26/2021 Medical Record Number: [id number] Patient Account Number: [id number] Date of Birth/Sex: 1970-07-21 (51 y.o. F) Treating RN: [name] [name] Primary Care Provider: Evern [name] Other Clinician: Massie [name] Referring Provider: Evern [name] Treating Provider/Extender: [name] Weeks in Treatment: 23 Diagnosis Coding ICD-10 Codes Code Description M86.371 Chronic multifocal osteomyelitis, right ankle and foot E11.621 Type 2 diabetes mellitus with foot ulcer L97.514 Non-pressure chronic ulcer of other part of right foot with necrosis of bone I50.42 Chronic combined systolic (congestive) and diastolic (congestive) heart failure I10 Essential (primary) hypertension N18.6 End stage renal disease Z99.2 Dependence on renal dialysis Z99.81 Dependence on supplemental oxygen Facility Procedures CPT4 Code: [id number] Description: [phone] - DEBRIDE WOUND 1ST 20 SQ CM OR < Modifier: Quantity: 1 CPT4 Code: Description: ICD-10 Diagnosis Description L97.514 Non-pressure chronic ulcer of other part of right foot with necrosis of b Modifier: one Quantity: Physician Procedures CPT4 Code: [id number] Description: 97597 - WC PHYS DEBR WO ANESTH 20 SQ  CM Modifier: Quantity: 1 CPT4 Code: Description: ICD-10 Diagnosis Description L97.514 Non-pressure chronic ulcer of other part of right foot with necrosis of b Modifier: one Quantity: Electronic Signature(s) Signed: 10/26/2021 1:55:34 PM By: Worthy Keeler [name] Entered By: Worthy Keeler on 10/26/2021 13:55:34

## 2021-10-26 NOTE — Progress Notes (Addendum)
YERLIN, GASPARYAN ([id number]) Visit Report for 10/26/2021 Arrival Information Details Patient Name: [name], [name] Date of Service: 10/26/2021 12:45 PM Medical Record Number: [id number] Patient Account Number: [id number] Date of Birth/Sex: April 13, 1970 (51 y.o. F) Treating RN: Cornell Barman Primary Care Frank Novelo: Evern Bio Other Clinician: Massie Kluver Referring Bessie Boyte: Evern Bio Treating Tymia Streb/Extender: Skipper Cliche in Treatment: 23 Visit Information History Since Last Visit All ordered tests and consults were completed: No Patient Arrived: Other Added or deleted any medications: No Arrival Time: 13:12 Any new allergies or adverse reactions: No Transfer Assistance: None Had a fall or experienced change in No Patient Requires Transmission-Based Precautions: No activities of daily living that may affect Patient Has Alerts: No risk of falls: Hospitalized since last visit: No Pain Present Now: No Electronic Signature(s) Signed: 10/26/2021 3:00:13 PM By: Gretta Cool, BSN, RN, CWS, Kim RN, BSN Entered By: Gretta Cool, BSN, RN, CWS, Kim on 10/26/2021 13:13:07 Wilmer, [name] ([id number]) -------------------------------------------------------------------------------- Clinic Level of Care Assessment Details Patient Name: Avetisyan, [name] Date of Service: 10/26/2021 12:45 PM Medical Record Number: [id number] Patient Account Number: [id number] Date of Birth/Sex: Nov 09, 1970 (51 y.o. F) Treating RN: Cornell Barman Primary Care Antonio Creswell: Evern Bio Other Clinician: Massie Kluver Referring Novella Abraha: Evern Bio Treating Imani Sherrin/Extender: Skipper Cliche in Treatment: 23 Clinic Level of Care Assessment Items TOOL 1 Quantity Score '[]'$  - Use when EandM and Procedure is performed on INITIAL visit 0 ASSESSMENTS - Nursing Assessment / Reassessment '[]'$  - General Physical Exam (combine w/ comprehensive assessment (listed just below) when performed on new 0 pt. evals) '[]'$  -  0 Comprehensive Assessment (HX, ROS, Risk Assessments, Wounds Hx, etc.) ASSESSMENTS - Wound and Skin Assessment / Reassessment '[]'$  - Dermatologic / Skin Assessment (not related to wound area) 0 ASSESSMENTS - Ostomy and/or Continence Assessment and Care '[]'$  - Incontinence Assessment and Management 0 '[]'$  - 0 Ostomy Care Assessment and Management (repouching, etc.) PROCESS - Coordination of Care '[]'$  - Simple Patient / Family Education for ongoing care 0 '[]'$  - 0 Complex (extensive) Patient / Family Education for ongoing care '[]'$  - 0 Staff obtains Programmer, systems, Records, Test Results / Process Orders '[]'$  - 0 Staff telephones HHA, Nursing Homes / Clarify orders / etc '[]'$  - 0 Routine Transfer to another Facility (non-emergent condition) '[]'$  - 0 Routine Hospital Admission (non-emergent condition) '[]'$  - 0 New Admissions / Biomedical engineer / Ordering NPWT, Apligraf, etc. '[]'$  - 0 Emergency Hospital Admission (emergent condition) PROCESS - Special Needs '[]'$  - Pediatric / Minor Patient Management 0 '[]'$  - 0 Isolation Patient Management '[]'$  - 0 Hearing / Language / Visual special needs '[]'$  - 0 Assessment of Community assistance (transportation, D/C planning, etc.) '[]'$  - 0 Additional assistance / Altered mentation '[]'$  - 0 Support Surface(s) Assessment (bed, cushion, seat, etc.) INTERVENTIONS - Miscellaneous '[]'$  - External ear exam 0 '[]'$  - 0 Patient Transfer (multiple staff / Civil Service fast streamer / Similar devices) '[]'$  - 0 Simple Staple / Suture removal (25 or less) '[]'$  - 0 Complex Staple / Suture removal (26 or more) '[]'$  - 0 Hypo/Hyperglycemic Management (do not check if billed separately) '[]'$  - 0 Ankle / Brachial Index (ABI) - do not check if billed separately Has the patient been seen at the hospital within the last three years: Yes Total Score: 0 Level Of Care: ____ [name] ([id number]) Electronic Signature(s) Signed: 10/26/2021 2:22:01 PM By: Massie Kluver Entered By: Massie Kluver on 10/26/2021  13:47:07 Cocozza, [name] ([id number]) -------------------------------------------------------------------------------- Complex / Palliative Patient Assessment Details Patient Name:  Furio, Saumya M. Date of Service: 10/26/2021 12:45 PM Medical Record Number: [id number] Patient Account Number: [id number] Date of Birth/Sex: 1970-06-05 (51 y.o. F) Treating RN: Cornell Barman Primary Care Skiler Tye: Evern Bio Other Clinician: Massie Kluver Referring Lincy Belles: Evern Bio Treating Zyaire Mccleod/Extender: Jeri Cos Weeks in Treatment: 23 Complex Wound Management Criteria Patient requires a surgical procedure in order to achieve wound healing: amputation stage 4 gangrene of toes However, the patient does not wish to undergo the recommended surgical procedure. Palliative Wound Management Criteria Care Approach Wound Care Plan: Complex Wound Management Electronic Signature(s) Signed: 11/09/2021 1:53:10 PM By: Gretta Cool, BSN, RN, CWS, Kim RN, BSN Signed: 11/23/2021 12:26:05 PM By: Worthy Keeler PA-C Entered By: Gretta Cool, BSN, RN, CWS, Kim on 11/09/2021 13:53:10 Plate, [name] ([id number]) -------------------------------------------------------------------------------- Encounter Discharge Information Details Patient Name: Caravello, VERNECIA UMBLE. Date of Service: 10/26/2021 12:45 PM Medical Record Number: [id number] Patient Account Number: [id number] Date of Birth/Sex: May 26, 1970 (51 y.o. F) Treating RN: Cornell Barman Primary Care Nitara Szczerba: Evern Bio Other Clinician: Massie Kluver Referring Anglia Blakley: Evern Bio Treating Desare Duddy/Extender: Skipper Cliche in Treatment: 23 Encounter Discharge Information Items Post Procedure Vitals Discharge Condition: Stable Temperature (F): 98.3 Ambulatory Status: Other Pulse (bpm): 78 Discharge Destination: Home Respiratory Rate (breaths/min): 18 Transportation: Private Auto Blood Pressure (mmHg): 106/83 Accompanied By: husband Schedule Follow-up  Appointment: Yes Clinical Summary of Care: Electronic Signature(s) Signed: 10/26/2021 2:22:01 PM By: Massie Kluver Entered By: Massie Kluver on 10/26/2021 14:01:38 Height, [name] ([id number]) -------------------------------------------------------------------------------- Lower Extremity Assessment Details Patient Name: Ruhe, [name] Date of Service: 10/26/2021 12:45 PM Medical Record Number: [id number] Patient Account Number: [id number] Date of Birth/Sex: [birth year] (51 y.o. F) Treating RN: Cornell Barman Primary Care Anasophia Pecor: Evern Bio Other Clinician: Massie Kluver Referring Jahnessa Vanduyn: Evern Bio Treating Luverta Korte/Extender: Jeri Cos Weeks in Treatment: 23 Electronic Signature(s) Signed: 10/26/2021 2:22:01 PM By: Massie Kluver Signed: 10/26/2021 3:00:13 PM By: Gretta Cool, BSN, RN, CWS, Kim RN, BSN Entered By: Massie Kluver on 10/26/2021 13:31:19 Inks, [name] ([id number]) -------------------------------------------------------------------------------- Multi Wound Chart Details Patient Name: Cosby, [name] Date of Service: 10/26/2021 12:45 PM Medical Record Number: [id number] Patient Account Number: [id number] Date of Birth/Sex: 1970-08-09 (51 y.o. F) Treating RN: Cornell Barman Primary Care Kimesha Claxton: Evern Bio Other Clinician: Massie Kluver Referring Mette Southgate: Evern Bio Treating Corrine Tillis/Extender: Jeri Cos Weeks in Treatment: 23 Vital Signs Height(in): 55 Pulse(bpm): 61 Weight(lbs): 36 Blood Pressure(mmHg): 106/83 Body Mass Index(BMI): 44.5 Temperature(F): 98.3 Respiratory Rate(breaths/min): 18 Photos: [2:No Photos] [N/A:No Photos N/A] Wound Location: [2:Right, Circumferential Foot] [N/A:Right, Circumferential Foot N/A] Wounding Event: [2:Gradually Appeared] [N/A:Gradually Appeared N/A] Primary Etiology: [2:Diabetic Wound/Ulcer of the Lower Diabetic Wound/Ulcer of the Lower N/A Extremity] [N/A:Extremity] Comorbid History: [2:Cataracts, Anemia,  Asthma, Chronic Cataracts, Anemia, Asthma, Chronic N/A Obstructive Pulmonary Disease (COPD), Congestive Heart Failure, (COPD), Congestive Heart Failure, Hypertension, Type II Diabetes, End Hypertension, Type II  Diabetes, End Stage Renal Disease, Osteoarthritis, Stage Renal Disease, Osteoarthritis, Neuropathy] [N/A:Obstructive Pulmonary Disease Neuropathy] Date Acquired: [2:10/15/2020] [N/A:10/15/2020 N/A] Weeks of Treatment: [2:23] [N/A:23 N/A] Wound Status: [2:Open] [N/A:Open N/A] Wound Recurrence: [2:No] [N/A:No N/A] Pending Amputation on [2:Yes] [N/A:Yes N/A] Presentation: Measurements L x W x D (cm) [2:12.2x14x0.4] [N/A:12.2x14x0.4 N/A] Area (cm) : [2:134.146] [N/A:134.146 N/A] Volume (cm) : [2:53.658] [N/A:53.658 N/A] % Reduction in Area: [2:-26.50%] [N/A:-26.50% N/A] % Reduction in Volume: [2:74.70%] [N/A:74.70% N/A] Classification: [2:Grade 4] [N/A:Grade 4 N/A] Exudate Amount: [2:Medium] [N/A:Medium N/A] Exudate Type: [2:Serosanguineous red, brown] [N/A:Serosanguineous N/A red, brown N/A] Treatment Notes Electronic Signature(s) Signed: 10/26/2021 2:22:01 PM By: Massie Kluver Entered By: Massie Kluver  on 10/26/2021 13:31:33 Barritt, JACKLYNE BAIK ([id number]) -------------------------------------------------------------------------------- [place] Details Patient Name: Weinand, BROCHA GILLIAM. Date of Service: 10/26/2021 12:45 PM Medical Record Number: [id number] Patient Account Number: [id number] Date of Birth/Sex: 1970-05-13 (51 y.o. F) Treating RN: Cornell Barman Primary Care Akshay Spang: Evern Bio Other Clinician: Massie Kluver Referring Nickey Kloepfer: Evern Bio Treating Jeidi Gilles/Extender: Skipper Cliche in Treatment: 23 Active Inactive HBO Nursing Diagnoses: Anxiety related to feelings of confinement associated with the hyperbaric oxygen chamber Anxiety related to knowledge deficit of hyperbaric oxygen therapy and treatment procedures Discomfort related to  temperature and humidity changes inside hyperbaric chamber Potential for barotraumas to ears, sinuses, teeth, and lungs or cerebral gas embolism related to changes in atmospheric pressure inside hyperbaric oxygen chamber Potential for oxygen toxicity seizures related to delivery of 100% oxygen at an increased atmospheric pressure Potential for pulmonary oxygen toxicity related to delivery of 100% oxygen at an increased atmospheric pressure Goals: Barotrauma will be prevented during HBO2 Date Initiated: 06/05/2021 Target Resolution Date: 06/05/2021 Goal Status: Active Patient and/or family will be able to state/discuss factors appropriate to the management of their disease process during treatment Date Initiated: 06/05/2021 Target Resolution Date: 06/05/2021 Goal Status: Active Patient will tolerate the hyperbaric oxygen therapy treatment Date Initiated: 06/05/2021 Target Resolution Date: 06/05/2021 Goal Status: Active Patient will tolerate the internal climate of the chamber Date Initiated: 06/05/2021 Target Resolution Date: 06/05/2021 Goal Status: Active Patient/caregiver will verbalize understanding of HBO goals, rationale, procedures and potential hazards Date Initiated: 06/05/2021 Target Resolution Date: 06/05/2021 Goal Status: Active Signs and symptoms of pulmonary oxygen toxicity will be recognized and promptly addressed Date Initiated: 06/05/2021 Target Resolution Date: 06/05/2021 Goal Status: Active Signs and symptoms of seizure will be recognized and promptly addressed ; seizing patients will suffer no harm Date Initiated: 06/05/2021 Target Resolution Date: 06/05/2021 Goal Status: Active Interventions: Administer a five (5) minute air break for patient if signs and symptoms of seizure appear and notify the hyperbaric physician Administer decongestants, per physician orders, prior to HBO2 Administer the correct therapeutic gas delivery based on the patients needs and limitations, per  physician order Assess and provide for patientos comfort related to the hyperbaric environment and equalization of middle ear Assess for signs and symptoms related to adverse events, including but not limited to confinement anxiety, pneumothorax, oxygen toxicity and baurotrauma Assess patient for any history of confinement anxiety Assess patient's knowledge and expectations regarding hyperbaric medicine and provide education related to the hyperbaric environment, goals of treatment and prevention of adverse events Implement protocols to decrease risk of pneumothorax in high risk patients Notes: Necrotic Tissue Nursing Diagnoses: Impaired tissue integrity related to necrotic/devitalized tissue Oyola, [name] ([id number]) Knowledge deficit related to management of necrotic/devitalized tissue Goals: Necrotic/devitalized tissue will be minimized in the wound bed Date Initiated: 06/05/2021 Target Resolution Date: 06/05/2021 Goal Status: Active Patient/caregiver will verbalize understanding of reason and process for debridement of necrotic tissue Date Initiated: 06/05/2021 Target Resolution Date: 06/05/2021 Goal Status: Active Interventions: Assess patient pain level pre-, during and post procedure and prior to discharge Provide education on necrotic tissue and debridement process Notes: Orientation to the Wound Care Program Nursing Diagnoses: Knowledge deficit related to the wound healing center program Goals: Patient/caregiver will verbalize understanding of the [facility] Date Initiated: 05/18/2021 Target Resolution Date: 05/18/2021 Goal Status: Active Interventions: Provide education on orientation to the wound center Notes: Osteomyelitis Nursing Diagnoses: Infection: osteomyelitis Knowledge deficit related to disease process and management Potential for infection: osteomyelitis Goals: Diagnostic evaluation for  osteomyelitis completed as ordered Date  Initiated: 06/05/2021 Target Resolution Date: 06/05/2021 Goal Status: Active Patient/caregiver will verbalize understanding of disease process and disease management Date Initiated: 06/05/2021 Target Resolution Date: 06/05/2021 Goal Status: Active [name] osteomyelitis will resolve Date Initiated: 06/05/2021 Target Resolution Date: 06/05/2021 Goal Status: Active Signs and symptoms for osteomyelitis will be recognized and promptly addressed Date Initiated: 06/05/2021 Target Resolution Date: 06/05/2021 Goal Status: Active Interventions: Assess for signs and symptoms of osteomyelitis resolution every visit Provide education on osteomyelitis Notes: Wound/Skin Impairment Nursing Diagnoses: Impaired tissue integrity Knowledge deficit related to ulceration/compromised skin integrity Goals: Ulcer/skin breakdown will have a volume reduction of 30% by week 4 Date Initiated: 05/18/2021 Target Resolution Date: 06/15/2021 LAMYIA, CDEBACA ([id number]) Goal Status: Active Ulcer/skin breakdown will have a volume reduction of 50% by week 8 Date Initiated: 05/18/2021 Target Resolution Date: 07/13/2021 Goal Status: Active Ulcer/skin breakdown will have a volume reduction of 80% by week 12 Date Initiated: 05/18/2021 Target Resolution Date: 08/10/2021 Goal Status: Active Ulcer/skin breakdown will heal within 14 weeks Date Initiated: 05/18/2021 Target Resolution Date: 08/24/2021 Goal Status: Active Interventions: Assess patient/caregiver ability to obtain necessary supplies Assess patient/caregiver ability to perform ulcer/skin care regimen upon admission and as needed Assess ulceration(s) every visit Provide education on ulcer and skin care Notes: Electronic Signature(s) Signed: 10/26/2021 2:22:01 PM By: Massie Kluver Signed: 10/26/2021 3:00:13 PM By: Gretta Cool, BSN, RN, CWS, Kim RN, BSN Entered By: Massie Kluver on 10/26/2021 13:31:24 Brissette, [name]  ([id number]) -------------------------------------------------------------------------------- Pain Assessment Details Patient Name: Koller, [name] Date of Service: 10/26/2021 12:45 PM Medical Record Number: [id number] Patient Account Number: [id number] Date of Birth/Sex: [birth year] (51 y.o. F) Treating RN: Cornell Barman Primary Care Roddy Bellamy: Evern Bio Other Clinician: Massie Kluver Referring Kysen Wetherington: Evern Bio Treating Brynlei Klausner/Extender: Skipper Cliche in Treatment: 23 Active Problems Location of Pain Severity and Description of Pain Patient Has Paino No Site Locations Pain Management and Medication Current Pain Management: Electronic Signature(s) Signed: 10/26/2021 3:00:13 PM By: Gretta Cool, BSN, RN, CWS, Kim RN, BSN Entered By: Gretta Cool, BSN, RN, CWS, Kim on 10/26/2021 13:16:40 Amico, [name] ([id number]) -------------------------------------------------------------------------------- Patient/Caregiver Education Details Patient Name: Bleiler, [name] Date of Service: 10/26/2021 12:45 PM Medical Record Number: [id number] Patient Account Number: [id number] Date of Birth/Gender: [birth year] (51 y.o. F) Treating RN: Cornell Barman Primary Care Physician: Evern Bio Other Clinician: Massie Kluver Referring Physician: Evern Bio Treating Physician/Extender: Skipper Cliche in Treatment: 23 Education Assessment Education Provided To: Patient Education Topics Provided Wound/Skin Impairment: Handouts: Other: continue wound care as directed Methods: Explain/Verbal Responses: State content correctly Electronic Signature(s) Signed: 10/26/2021 2:22:01 PM By: Massie Kluver Entered By: Massie Kluver on 10/26/2021 14:00:22 Churilla, [name] ([id number]) -------------------------------------------------------------------------------- Wound Assessment Details Patient Name: Vanoverbeke, [name] Date of Service: 10/26/2021 12:45 PM Medical Record Number: [id number] Patient Account  Number: [id number] Date of Birth/Sex: 1970-09-30 (51 y.o. F) Treating RN: Cornell Barman Primary Care Jadien Lehigh: Evern Bio Other Clinician: Massie Kluver Referring Mirtha Jain: Evern Bio Treating Vonita Calloway/Extender: Jeri Cos Weeks in Treatment: 23 Wound Status Wound Number: 2 Primary Diabetic Wound/Ulcer of the Lower Extremity Etiology: Wound Location: Right, Circumferential Foot Wound Open Wounding Event: Gradually Appeared Status: Date Acquired: 10/15/2020 Notes: open areas to midline right foot, lateral right foot, and top Weeks Of Treatment: 23 of right foot Clustered Wound: No Comorbid Cataracts, Anemia, Asthma, Chronic Obstructive Pulmonary Pending Amputation On Presentation History: Disease (COPD), Congestive Heart Failure, Hypertension, Type II Diabetes, End Stage Renal Disease, Osteoarthritis, Neuropathy Wound Measurements Length: (cm) 12.2 Width: (cm) 14  Depth: (cm) 0.4 Area: (cm) 134.146 Volume: (cm) 53.658 % Reduction in Area: -26.5% % Reduction in Volume: 74.7% Wound Description Classification: Grade 4 Exudate Amount: Medium Exudate Type: Serosanguineous Exudate Color: red, brown Electronic Signature(s) Signed: 10/26/2021 2:22:01 PM By: Massie Kluver Signed: 10/26/2021 3:00:13 PM By: Gretta Cool, BSN, RN, CWS, Kim RN, BSN Entered By: Massie Kluver on 10/26/2021 13:28:12 Koman, [name] ([id number]) -------------------------------------------------------------------------------- Wound Assessment Details Patient Name: Tiemann, [name] Date of Service: 10/26/2021 12:45 PM Medical Record Number: [id number] Patient Account Number: [id number] Date of Birth/Sex: 1970-07-20 (51 y.o. F) Treating RN: Cornell Barman Primary Care Omaira Mellen: Evern Bio Other Clinician: Massie Kluver Referring Tashaya Ancrum: Evern Bio Treating Katalia Choma/Extender: Jeri Cos Weeks in Treatment: 23 Wound Status Wound Number: 2 Primary Diabetic Wound/Ulcer of the Lower  Extremity Etiology: Wound Location: Right, Circumferential Foot Wound Open Wounding Event: Gradually Appeared Status: Date Acquired: 10/15/2020 Notes: open areas to midline right foot, lateral right foot, and top Weeks Of Treatment: 23 of right foot Clustered Wound: No Comorbid Cataracts, Anemia, Asthma, Chronic Obstructive Pulmonary Pending Amputation On Presentation History: Disease (COPD), Congestive Heart Failure, Hypertension, Type II Diabetes, End Stage Renal Disease, Osteoarthritis, Neuropathy Wound Measurements Length: (cm) 12.2 Width: (cm) 14 Depth: (cm) 0.4 Area: (cm) 134.146 Volume: (cm) 53.658 % Reduction in Area: -26.5% % Reduction in Volume: 74.7% Wound Description Classification: Grade 4 Exudate Amount: Medium Exudate Type: Serosanguineous Exudate Color: red, brown Treatment Notes Wound #2 (Foot) Wound Laterality: Right, Circumferential Cleanser Dakin 16 (oz) 0.25 Discharge Instruction: wet/dry in open areas of foot. Normal Saline Discharge Instruction: Wash your hands with soap and water. Remove old dressing, discard into plastic bag and place into trash. Cleanse the wound with Normal Saline prior to applying a clean dressing using gauze sponges, not tissues or cotton balls. Do not scrub or use excessive force. Pat dry using gauze sponges, not tissue or cotton balls. Soap and Water Discharge Instruction: Gently cleanse wound with antibacterial soap, rinse and pat dry prior to dressing wounds Peri-Wound Care Topical Betadine Discharge Instruction: Apply betadine as directed. apply to gauze and pack sites and lay op open areas to top of foot Primary Dressing Gauze Discharge Instruction: As directed: betadine applied Secondary Dressing ABD Pad 5x9 (in/in) Discharge Instruction: Cover with ABD pad Secured With Medipore Tape - 85M Medipore H Soft Cloth Surgical Tape, 2x2 (in/yd) Kerlix Roll Sterile or Non-Sterile 6-ply 4.5x4 (yd/yd) Petrey, [name]  ([id number]) Discharge Instruction: Apply Kerlix as directed Stretch Net Dressing, Latex-free, Size 5, Small-Head / Shoulder / Thigh Compression Wrap Compression Stockings Add-Ons Electronic Signature(s) Signed: 10/26/2021 2:22:01 PM By: Massie Kluver Signed: 10/26/2021 3:00:13 PM By: Gretta Cool, BSN, RN, CWS, Kim RN, BSN Entered By: Massie Kluver on 10/26/2021 13:31:10 Wenz, [name] ([id number]) -------------------------------------------------------------------------------- Vitals Details Patient Name: Barbuto, [name] Date of Service: 10/26/2021 12:45 PM Medical Record Number: [id number] Patient Account Number: [id number] Date of Birth/Sex: 01/29/1971 (51 y.o. F) Treating RN: Cornell Barman Primary Care Keriann Rankin: Evern Bio Other Clinician: Massie Kluver Referring Marlyn Tondreau: Evern Bio Treating Aubry Rankin/Extender: Jeri Cos Weeks in Treatment: 23 Vital Signs Time Taken: 13:13 Temperature (F): 98.3 Height (in): 64 Pulse (bpm): 78 Weight (lbs): 259 Respiratory Rate (breaths/min): 18 Body Mass Index (BMI): 44.5 Blood Pressure (mmHg): 106/83 Reference Range: 80 - 120 mg / dl Electronic Signature(s) Signed: 10/26/2021 3:00:13 PM By: Gretta Cool, BSN, RN, CWS, Kim RN, BSN Entered By: Gretta Cool, BSN, RN, CWS, Kim on 10/26/2021 13:16:22

## 2021-10-27 DIAGNOSIS — Z992 Dependence on renal dialysis: Secondary | ICD-10-CM | POA: Diagnosis not present

## 2021-10-27 DIAGNOSIS — N186 End stage renal disease: Secondary | ICD-10-CM | POA: Diagnosis not present

## 2021-10-28 DIAGNOSIS — E1122 Type 2 diabetes mellitus with diabetic chronic kidney disease: Secondary | ICD-10-CM | POA: Diagnosis not present

## 2021-10-28 DIAGNOSIS — Z9981 Dependence on supplemental oxygen: Secondary | ICD-10-CM | POA: Diagnosis not present

## 2021-10-28 DIAGNOSIS — D631 Anemia in chronic kidney disease: Secondary | ICD-10-CM | POA: Diagnosis not present

## 2021-10-28 DIAGNOSIS — I132 Hypertensive heart and chronic kidney disease with heart failure and with stage 5 chronic kidney disease, or end stage renal disease: Secondary | ICD-10-CM | POA: Diagnosis not present

## 2021-10-28 DIAGNOSIS — I5032 Chronic diastolic (congestive) heart failure: Secondary | ICD-10-CM | POA: Diagnosis not present

## 2021-10-28 DIAGNOSIS — N186 End stage renal disease: Secondary | ICD-10-CM | POA: Diagnosis not present

## 2021-10-28 DIAGNOSIS — E11621 Type 2 diabetes mellitus with foot ulcer: Secondary | ICD-10-CM | POA: Diagnosis not present

## 2021-10-28 DIAGNOSIS — E1151 Type 2 diabetes mellitus with diabetic peripheral angiopathy without gangrene: Secondary | ICD-10-CM | POA: Diagnosis not present

## 2021-10-28 DIAGNOSIS — Z992 Dependence on renal dialysis: Secondary | ICD-10-CM | POA: Diagnosis not present

## 2021-10-28 DIAGNOSIS — L97513 Non-pressure chronic ulcer of other part of right foot with necrosis of muscle: Secondary | ICD-10-CM | POA: Diagnosis not present

## 2021-10-28 DIAGNOSIS — Z794 Long term (current) use of insulin: Secondary | ICD-10-CM | POA: Diagnosis not present

## 2021-10-30 DIAGNOSIS — N186 End stage renal disease: Secondary | ICD-10-CM | POA: Diagnosis not present

## 2021-10-30 DIAGNOSIS — Z992 Dependence on renal dialysis: Secondary | ICD-10-CM | POA: Diagnosis not present

## 2021-10-31 DIAGNOSIS — L97513 Non-pressure chronic ulcer of other part of right foot with necrosis of muscle: Secondary | ICD-10-CM | POA: Diagnosis not present

## 2021-10-31 DIAGNOSIS — D631 Anemia in chronic kidney disease: Secondary | ICD-10-CM | POA: Diagnosis not present

## 2021-10-31 DIAGNOSIS — E1122 Type 2 diabetes mellitus with diabetic chronic kidney disease: Secondary | ICD-10-CM | POA: Diagnosis not present

## 2021-10-31 DIAGNOSIS — E1151 Type 2 diabetes mellitus with diabetic peripheral angiopathy without gangrene: Secondary | ICD-10-CM | POA: Diagnosis not present

## 2021-10-31 DIAGNOSIS — I5032 Chronic diastolic (congestive) heart failure: Secondary | ICD-10-CM | POA: Diagnosis not present

## 2021-10-31 DIAGNOSIS — Z992 Dependence on renal dialysis: Secondary | ICD-10-CM | POA: Diagnosis not present

## 2021-10-31 DIAGNOSIS — Z9981 Dependence on supplemental oxygen: Secondary | ICD-10-CM | POA: Diagnosis not present

## 2021-10-31 DIAGNOSIS — I132 Hypertensive heart and chronic kidney disease with heart failure and with stage 5 chronic kidney disease, or end stage renal disease: Secondary | ICD-10-CM | POA: Diagnosis not present

## 2021-10-31 DIAGNOSIS — N186 End stage renal disease: Secondary | ICD-10-CM | POA: Diagnosis not present

## 2021-10-31 DIAGNOSIS — E11621 Type 2 diabetes mellitus with foot ulcer: Secondary | ICD-10-CM | POA: Diagnosis not present

## 2021-10-31 DIAGNOSIS — Z794 Long term (current) use of insulin: Secondary | ICD-10-CM | POA: Diagnosis not present

## 2021-11-01 DIAGNOSIS — N186 End stage renal disease: Secondary | ICD-10-CM | POA: Diagnosis not present

## 2021-11-01 DIAGNOSIS — Z992 Dependence on renal dialysis: Secondary | ICD-10-CM | POA: Diagnosis not present

## 2021-11-02 DIAGNOSIS — E1151 Type 2 diabetes mellitus with diabetic peripheral angiopathy without gangrene: Secondary | ICD-10-CM | POA: Diagnosis not present

## 2021-11-02 DIAGNOSIS — E11621 Type 2 diabetes mellitus with foot ulcer: Secondary | ICD-10-CM | POA: Diagnosis not present

## 2021-11-02 DIAGNOSIS — N186 End stage renal disease: Secondary | ICD-10-CM | POA: Diagnosis not present

## 2021-11-02 DIAGNOSIS — E1122 Type 2 diabetes mellitus with diabetic chronic kidney disease: Secondary | ICD-10-CM | POA: Diagnosis not present

## 2021-11-02 DIAGNOSIS — D631 Anemia in chronic kidney disease: Secondary | ICD-10-CM | POA: Diagnosis not present

## 2021-11-02 DIAGNOSIS — Z9981 Dependence on supplemental oxygen: Secondary | ICD-10-CM | POA: Diagnosis not present

## 2021-11-02 DIAGNOSIS — I132 Hypertensive heart and chronic kidney disease with heart failure and with stage 5 chronic kidney disease, or end stage renal disease: Secondary | ICD-10-CM | POA: Diagnosis not present

## 2021-11-02 DIAGNOSIS — Z794 Long term (current) use of insulin: Secondary | ICD-10-CM | POA: Diagnosis not present

## 2021-11-02 DIAGNOSIS — I5032 Chronic diastolic (congestive) heart failure: Secondary | ICD-10-CM | POA: Diagnosis not present

## 2021-11-02 DIAGNOSIS — Z992 Dependence on renal dialysis: Secondary | ICD-10-CM | POA: Diagnosis not present

## 2021-11-02 DIAGNOSIS — L97513 Non-pressure chronic ulcer of other part of right foot with necrosis of muscle: Secondary | ICD-10-CM | POA: Diagnosis not present

## 2021-11-03 DIAGNOSIS — Z992 Dependence on renal dialysis: Secondary | ICD-10-CM | POA: Diagnosis not present

## 2021-11-03 DIAGNOSIS — N186 End stage renal disease: Secondary | ICD-10-CM | POA: Diagnosis not present

## 2021-11-05 DIAGNOSIS — I5032 Chronic diastolic (congestive) heart failure: Secondary | ICD-10-CM | POA: Diagnosis not present

## 2021-11-05 DIAGNOSIS — E11621 Type 2 diabetes mellitus with foot ulcer: Secondary | ICD-10-CM | POA: Diagnosis not present

## 2021-11-05 DIAGNOSIS — L97513 Non-pressure chronic ulcer of other part of right foot with necrosis of muscle: Secondary | ICD-10-CM | POA: Diagnosis not present

## 2021-11-05 DIAGNOSIS — E1122 Type 2 diabetes mellitus with diabetic chronic kidney disease: Secondary | ICD-10-CM | POA: Diagnosis not present

## 2021-11-05 DIAGNOSIS — H2511 Age-related nuclear cataract, right eye: Secondary | ICD-10-CM | POA: Diagnosis not present

## 2021-11-05 DIAGNOSIS — Z992 Dependence on renal dialysis: Secondary | ICD-10-CM | POA: Diagnosis not present

## 2021-11-05 DIAGNOSIS — I132 Hypertensive heart and chronic kidney disease with heart failure and with stage 5 chronic kidney disease, or end stage renal disease: Secondary | ICD-10-CM | POA: Diagnosis not present

## 2021-11-05 DIAGNOSIS — Z9981 Dependence on supplemental oxygen: Secondary | ICD-10-CM | POA: Diagnosis not present

## 2021-11-05 DIAGNOSIS — N186 End stage renal disease: Secondary | ICD-10-CM | POA: Diagnosis not present

## 2021-11-05 DIAGNOSIS — Z794 Long term (current) use of insulin: Secondary | ICD-10-CM | POA: Diagnosis not present

## 2021-11-05 DIAGNOSIS — D631 Anemia in chronic kidney disease: Secondary | ICD-10-CM | POA: Diagnosis not present

## 2021-11-05 DIAGNOSIS — E1151 Type 2 diabetes mellitus with diabetic peripheral angiopathy without gangrene: Secondary | ICD-10-CM | POA: Diagnosis not present

## 2021-11-06 DIAGNOSIS — Z992 Dependence on renal dialysis: Secondary | ICD-10-CM | POA: Diagnosis not present

## 2021-11-06 DIAGNOSIS — N186 End stage renal disease: Secondary | ICD-10-CM | POA: Diagnosis not present

## 2021-11-07 DIAGNOSIS — E1151 Type 2 diabetes mellitus with diabetic peripheral angiopathy without gangrene: Secondary | ICD-10-CM | POA: Diagnosis not present

## 2021-11-07 DIAGNOSIS — D631 Anemia in chronic kidney disease: Secondary | ICD-10-CM | POA: Diagnosis not present

## 2021-11-07 DIAGNOSIS — I132 Hypertensive heart and chronic kidney disease with heart failure and with stage 5 chronic kidney disease, or end stage renal disease: Secondary | ICD-10-CM | POA: Diagnosis not present

## 2021-11-07 DIAGNOSIS — Z794 Long term (current) use of insulin: Secondary | ICD-10-CM | POA: Diagnosis not present

## 2021-11-07 DIAGNOSIS — E1122 Type 2 diabetes mellitus with diabetic chronic kidney disease: Secondary | ICD-10-CM | POA: Diagnosis not present

## 2021-11-07 DIAGNOSIS — Z9981 Dependence on supplemental oxygen: Secondary | ICD-10-CM | POA: Diagnosis not present

## 2021-11-07 DIAGNOSIS — N186 End stage renal disease: Secondary | ICD-10-CM | POA: Diagnosis not present

## 2021-11-07 DIAGNOSIS — Z992 Dependence on renal dialysis: Secondary | ICD-10-CM | POA: Diagnosis not present

## 2021-11-07 DIAGNOSIS — I5032 Chronic diastolic (congestive) heart failure: Secondary | ICD-10-CM | POA: Diagnosis not present

## 2021-11-07 DIAGNOSIS — L97513 Non-pressure chronic ulcer of other part of right foot with necrosis of muscle: Secondary | ICD-10-CM | POA: Diagnosis not present

## 2021-11-07 DIAGNOSIS — E11621 Type 2 diabetes mellitus with foot ulcer: Secondary | ICD-10-CM | POA: Diagnosis not present

## 2021-11-09 DIAGNOSIS — I132 Hypertensive heart and chronic kidney disease with heart failure and with stage 5 chronic kidney disease, or end stage renal disease: Secondary | ICD-10-CM | POA: Diagnosis not present

## 2021-11-09 DIAGNOSIS — Z992 Dependence on renal dialysis: Secondary | ICD-10-CM | POA: Diagnosis not present

## 2021-11-09 DIAGNOSIS — E1151 Type 2 diabetes mellitus with diabetic peripheral angiopathy without gangrene: Secondary | ICD-10-CM | POA: Diagnosis not present

## 2021-11-09 DIAGNOSIS — N186 End stage renal disease: Secondary | ICD-10-CM | POA: Diagnosis not present

## 2021-11-09 DIAGNOSIS — E1122 Type 2 diabetes mellitus with diabetic chronic kidney disease: Secondary | ICD-10-CM | POA: Diagnosis not present

## 2021-11-09 DIAGNOSIS — I5032 Chronic diastolic (congestive) heart failure: Secondary | ICD-10-CM | POA: Diagnosis not present

## 2021-11-09 DIAGNOSIS — Z794 Long term (current) use of insulin: Secondary | ICD-10-CM | POA: Diagnosis not present

## 2021-11-09 DIAGNOSIS — L97513 Non-pressure chronic ulcer of other part of right foot with necrosis of muscle: Secondary | ICD-10-CM | POA: Diagnosis not present

## 2021-11-09 DIAGNOSIS — D631 Anemia in chronic kidney disease: Secondary | ICD-10-CM | POA: Diagnosis not present

## 2021-11-09 DIAGNOSIS — Z9981 Dependence on supplemental oxygen: Secondary | ICD-10-CM | POA: Diagnosis not present

## 2021-11-09 DIAGNOSIS — E11621 Type 2 diabetes mellitus with foot ulcer: Secondary | ICD-10-CM | POA: Diagnosis not present

## 2021-11-10 DIAGNOSIS — Z992 Dependence on renal dialysis: Secondary | ICD-10-CM | POA: Diagnosis not present

## 2021-11-10 DIAGNOSIS — N186 End stage renal disease: Secondary | ICD-10-CM | POA: Diagnosis not present

## 2021-11-13 DIAGNOSIS — N186 End stage renal disease: Secondary | ICD-10-CM | POA: Diagnosis not present

## 2021-11-13 DIAGNOSIS — Z992 Dependence on renal dialysis: Secondary | ICD-10-CM | POA: Diagnosis not present

## 2021-11-15 DIAGNOSIS — N186 End stage renal disease: Secondary | ICD-10-CM | POA: Diagnosis not present

## 2021-11-15 DIAGNOSIS — Z992 Dependence on renal dialysis: Secondary | ICD-10-CM | POA: Diagnosis not present

## 2021-11-16 ENCOUNTER — Telehealth: Payer: Self-pay

## 2021-11-16 DIAGNOSIS — I132 Hypertensive heart and chronic kidney disease with heart failure and with stage 5 chronic kidney disease, or end stage renal disease: Secondary | ICD-10-CM | POA: Diagnosis not present

## 2021-11-16 DIAGNOSIS — D631 Anemia in chronic kidney disease: Secondary | ICD-10-CM | POA: Diagnosis not present

## 2021-11-16 DIAGNOSIS — L97513 Non-pressure chronic ulcer of other part of right foot with necrosis of muscle: Secondary | ICD-10-CM | POA: Diagnosis not present

## 2021-11-16 DIAGNOSIS — Z9981 Dependence on supplemental oxygen: Secondary | ICD-10-CM | POA: Diagnosis not present

## 2021-11-16 DIAGNOSIS — Z794 Long term (current) use of insulin: Secondary | ICD-10-CM | POA: Diagnosis not present

## 2021-11-16 DIAGNOSIS — E11621 Type 2 diabetes mellitus with foot ulcer: Secondary | ICD-10-CM | POA: Diagnosis not present

## 2021-11-16 DIAGNOSIS — Z7952 Long term (current) use of systemic steroids: Secondary | ICD-10-CM | POA: Diagnosis not present

## 2021-11-16 DIAGNOSIS — I5032 Chronic diastolic (congestive) heart failure: Secondary | ICD-10-CM | POA: Diagnosis not present

## 2021-11-16 DIAGNOSIS — N186 End stage renal disease: Secondary | ICD-10-CM | POA: Diagnosis not present

## 2021-11-16 DIAGNOSIS — Z992 Dependence on renal dialysis: Secondary | ICD-10-CM | POA: Diagnosis not present

## 2021-11-16 DIAGNOSIS — J449 Chronic obstructive pulmonary disease, unspecified: Secondary | ICD-10-CM | POA: Diagnosis not present

## 2021-11-16 DIAGNOSIS — E1122 Type 2 diabetes mellitus with diabetic chronic kidney disease: Secondary | ICD-10-CM | POA: Diagnosis not present

## 2021-11-16 DIAGNOSIS — E1151 Type 2 diabetes mellitus with diabetic peripheral angiopathy without gangrene: Secondary | ICD-10-CM | POA: Diagnosis not present

## 2021-11-16 NOTE — Telephone Encounter (Signed)
Lm to request that she bring SD card to 11/19/2021 appt.

## 2021-11-17 DIAGNOSIS — N186 End stage renal disease: Secondary | ICD-10-CM | POA: Diagnosis not present

## 2021-11-17 DIAGNOSIS — Z992 Dependence on renal dialysis: Secondary | ICD-10-CM | POA: Diagnosis not present

## 2021-11-18 DIAGNOSIS — I5032 Chronic diastolic (congestive) heart failure: Secondary | ICD-10-CM | POA: Diagnosis not present

## 2021-11-18 DIAGNOSIS — J449 Chronic obstructive pulmonary disease, unspecified: Secondary | ICD-10-CM | POA: Diagnosis not present

## 2021-11-18 DIAGNOSIS — J9611 Chronic respiratory failure with hypoxia: Secondary | ICD-10-CM | POA: Diagnosis not present

## 2021-11-19 ENCOUNTER — Telehealth: Payer: Self-pay | Admitting: *Deleted

## 2021-11-19 ENCOUNTER — Encounter: Payer: Self-pay | Admitting: Pulmonary Disease

## 2021-11-19 ENCOUNTER — Ambulatory Visit (INDEPENDENT_AMBULATORY_CARE_PROVIDER_SITE_OTHER): Payer: Medicare Other | Admitting: Pulmonary Disease

## 2021-11-19 VITALS — BP 110/70 | HR 91 | Temp 98.0°F | Ht 64.0 in | Wt 265.0 lb

## 2021-11-19 DIAGNOSIS — E1122 Type 2 diabetes mellitus with diabetic chronic kidney disease: Secondary | ICD-10-CM | POA: Diagnosis not present

## 2021-11-19 DIAGNOSIS — Z9981 Dependence on supplemental oxygen: Secondary | ICD-10-CM | POA: Diagnosis not present

## 2021-11-19 DIAGNOSIS — Z794 Long term (current) use of insulin: Secondary | ICD-10-CM | POA: Diagnosis not present

## 2021-11-19 DIAGNOSIS — I272 Pulmonary hypertension, unspecified: Secondary | ICD-10-CM | POA: Diagnosis not present

## 2021-11-19 DIAGNOSIS — J301 Allergic rhinitis due to pollen: Secondary | ICD-10-CM | POA: Diagnosis not present

## 2021-11-19 DIAGNOSIS — E11621 Type 2 diabetes mellitus with foot ulcer: Secondary | ICD-10-CM | POA: Diagnosis not present

## 2021-11-19 DIAGNOSIS — J4550 Severe persistent asthma, uncomplicated: Secondary | ICD-10-CM | POA: Diagnosis not present

## 2021-11-19 DIAGNOSIS — G4733 Obstructive sleep apnea (adult) (pediatric): Secondary | ICD-10-CM

## 2021-11-19 DIAGNOSIS — E1151 Type 2 diabetes mellitus with diabetic peripheral angiopathy without gangrene: Secondary | ICD-10-CM | POA: Diagnosis not present

## 2021-11-19 DIAGNOSIS — D631 Anemia in chronic kidney disease: Secondary | ICD-10-CM | POA: Diagnosis not present

## 2021-11-19 DIAGNOSIS — E662 Morbid (severe) obesity with alveolar hypoventilation: Secondary | ICD-10-CM | POA: Diagnosis not present

## 2021-11-19 DIAGNOSIS — L97513 Non-pressure chronic ulcer of other part of right foot with necrosis of muscle: Secondary | ICD-10-CM | POA: Diagnosis not present

## 2021-11-19 DIAGNOSIS — Z7952 Long term (current) use of systemic steroids: Secondary | ICD-10-CM | POA: Diagnosis not present

## 2021-11-19 DIAGNOSIS — N186 End stage renal disease: Secondary | ICD-10-CM | POA: Diagnosis not present

## 2021-11-19 DIAGNOSIS — I5032 Chronic diastolic (congestive) heart failure: Secondary | ICD-10-CM | POA: Diagnosis not present

## 2021-11-19 DIAGNOSIS — Z992 Dependence on renal dialysis: Secondary | ICD-10-CM | POA: Diagnosis not present

## 2021-11-19 DIAGNOSIS — I132 Hypertensive heart and chronic kidney disease with heart failure and with stage 5 chronic kidney disease, or end stage renal disease: Secondary | ICD-10-CM | POA: Diagnosis not present

## 2021-11-19 LAB — NITRIC OXIDE: Nitric Oxide: 21

## 2021-11-19 NOTE — Telephone Encounter (Signed)
Spoke to patient and requested SD card. She stated that she has not been wearing bipap.  Nothing further needed.

## 2021-11-19 NOTE — Progress Notes (Signed)
Subjective:    Patient ID: [name], female    DOB: [birth year], 51 y.o.   MRN: [id number] Patient Care Team: [name], NP as PCP - General (Nurse Practitioner) Minna Merritts, MD as PCP - Cardiology (Cardiology) Minna Merritts, MD as Consulting Physician (Cardiology) Alisa Graff, FNP as Nurse Practitioner (Family Medicine) Tyler Pita, MD as Consulting Physician (Pulmonary Disease)  Chief Complaint  Patient presents with   Follow-up    HPI Patient is a 51 year old morbidly obese lifelong never smoker, with a history of ESRD who presents for follow-up on the issue of asthma and shortness of breath.  Patient also has SEVERE sleep apnea noted by sleep study of 09 April 2021.  Patient had declined an in lab sleep study however during an admission in March 2023 to [facility] it was noted that she also had obesity with alveolar hypoventilation and she was set up with BiPAP.  Patient has had difficulties complying with BiPAP due to difficulties with tolerating the mask.  She feels that if she had a better fitting mask she would do better.  He feels she would do better with a nasal mask.  I have recommended that she contact her DME company so that they can fit her with the appropriate mask.  Also advised her of the consequences of untreated sleep apnea particularly with the severity of her sleep apnea.  She has significant pulmonary hypertension and untreated sleep apnea will make this issue worse.  Her only concern today is that of nasal congestion.  She believes it is due to "allergies" she has not had any fevers, chills or sweats.  No chest pain.  No orthopnea or paroxysmal nocturnal dyspnea.  She continues to follow with the wound clinic for right foot wound/gangrene.   She is maintained on Perforomist,Pulmicort and Yupelri for her chronic asthmatic bronchitis.  Has albuterol as needed.   Review of Systems A 10 point review of systems was performed and it is as  noted above otherwise negative.  Patient Active Problem List   Diagnosis Date Noted   SOB (shortness of breath)    History of osteomyelitis    RUQ pain    Status post thoracentesis    Acute hematogenous osteomyelitis of right foot ([place])    Pancreatitis 29/93/7169   Metabolic acidosis, increased anion gap 04/21/2021   GERD (gastroesophageal reflux disease) 04/21/2021   Acute on chronic diastolic CHF (congestive heart failure) ([name]) 11/14/2020   Anemia of chronic disease    Hyponatremia    Gangrene (HCC)    PVD (peripheral vascular disease) ([place])    Aspiration into airway    Hearing loss of right ear    Diabetic foot ulcer ([place]) 10/15/2020   Decreased pedal pulses 10/15/2020   Chronic respiratory failure with hypoxia ([place])    Extreme obesity with alveolar hypoventilation ([place])    Palliative care by specialist    Goals of care, counseling/discussion    ESRD (end stage renal disease) ([place])    Adjustment disorder with mixed anxiety and depressed mood 03/31/2017   Dysthymia 03/31/2017   Chronic pain syndrome 03/31/2017   Palliative care encounter    Obesity hypoventilation syndrome ([place]) 03/09/2017   Chronic diastolic heart failure ([place]) 01/07/2017   Depression 09/24/2016   Acute on chronic diastolic heart failure ([place]) 07/04/2016   COPD with chronic bronchitis 07/04/2016   Acute on chronic respiratory failure ([place]) 06/26/2016   CKD (chronic kidney disease), stage III ([place]) 06/26/2016  COPD exacerbation ([place]) 03/25/2016   Anxiety 10/02/2015   Asthma 08/30/2015   Type 2 diabetes mellitus with ESRD (end-stage renal disease) ([place]) 03/13/2015   Iron deficiency anemia 02/17/2015   Obesity, Class III, BMI 40-49.9 (morbid obesity) ([place])    Shortness of breath    Essential hypertension    Hypertensive heart disease with congestive heart failure ([place]) 12/18/2014   Diabetes mellitus with ESRD (end-stage renal disease) ([place]) 12/15/2014   Social History   Tobacco Use   Smoking  status: Never    Passive exposure: Past   Smokeless tobacco: Never  Substance Use Topics   Alcohol use: Not Currently    Comment: occasional   Allergies  Allergen Reactions   Dust Mite Extract Shortness Of Breath and Itching   Mold Extract [Trichophyton] Shortness Of Breath and Itching   Pollen Extract Shortness Of Breath    Itching, shortness of breath, asthma like symptoms   Sulfa Antibiotics Itching   Misc. Sulfonamide Containing Compounds    Feraheme [Ferumoxytol] Itching    Uses benadryl so she can get the medicine   Current Meds  Medication Sig   acetaminophen (TYLENOL) 500 MG tablet Take 1,000 mg by mouth every 6 (six) hours as needed for mild pain, fever or headache.   albuterol (PROVENTIL) (2.5 MG/3ML) 0.083% nebulizer solution Take 3 mLs (2.5 mg total) by nebulization every 6 (six) hours as needed for wheezing or shortness of breath.   albuterol (VENTOLIN HFA) 108 (90 Base) MCG/ACT inhaler Inhale 2 puffs into the lungs every 6 (six) hours as needed for wheezing or shortness of breath.   aspirin EC 81 MG tablet Take 81 mg by mouth daily.    atorvastatin (LIPITOR) 40 MG tablet Take 1 tablet (40 mg total) by mouth daily.   b complex-vitamin c-folic acid (NEPHRO-VITE) 0.8 MG TABS tablet Take 1 tablet by mouth at bedtime.   budesonide (PULMICORT) 0.25 MG/2ML nebulizer solution Take 2 mLs (0.25 mg total) by nebulization 2 (two) times daily.   budesonide (PULMICORT) 0.5 MG/2ML nebulizer solution Take by nebulization.   Cholecalciferol 100 MCG (4000 UT) CAPS Take 1 capsule by mouth once a week.   clindamycin (CLEOCIN T) 1 % lotion Apply 1 application topically daily.   ferric citrate (AURYXIA) 1 GM 210 MG(Fe) tablet Take 210 mg by mouth 3 (three) times daily with meals. Only taking once a day most days   formoterol (PERFOROMIST) 20 MCG/2ML nebulizer solution    insulin aspart (NOVOLOG) 100 UNIT/ML injection Inject 0-15 Units into the skin 3 (three) times daily with meals.   insulin  degludec (TRESIBA) 100 UNIT/ML FlexTouch Pen Inject 30 Units into the skin daily.   insulin lispro (HUMALOG) 100 UNIT/ML KwikPen Inject into the skin.   lactulose (CHRONULAC) 10 GM/15ML solution Take 30 mLs (20 g total) by mouth daily as needed for severe constipation.   lidocaine-prilocaine (EMLA) cream Apply topically as needed (For access for dialysis).   LINZESS 290 MCG CAPS capsule Take 290 mcg by mouth every morning.   meloxicam (MOBIC) 15 MG tablet Take 1 tablet (15 mg total) by mouth daily as needed for pain.   metoprolol tartrate (LOPRESSOR) 50 MG tablet Take 1 tablet (50 mg total) by mouth 2 (two) times daily.   midodrine (PROAMATINE) 10 MG tablet Take 20 mg by mouth daily as needed.   montelukast (SINGULAIR) 10 MG tablet Take 1 tablet (10 mg total) by mouth at bedtime.   Omeprazole Magnesium (PRILOSEC PO)    OXYGEN Inhale 2-4  L into the lungs.   pregabalin (LYRICA) 50 MG capsule Take 100 mg by mouth every 8 (eight) hours as needed.   RENVELA 2.4 g PACK Take 2.4 g by mouth 3 (three) times daily.   revefenacin (YUPELRI) 175 MCG/3ML nebulizer solution    Immunization History  Administered Date(s) Administered   Hepatitis B, adult 09/26/2018   Influenza,inj,Quad PF,6+ Mos 11/14/2016, 01/02/2021   Influenza-Unspecified 11/14/2016, 12/27/2017, 10/16/2018, 11/05/2018   PPD Test 04/15/2017, 05/06/2017, 05/15/2017, 04/28/2018, 04/13/2019, 06/01/2020   Pneumococcal Polysaccharide-23 04/29/2017       Objective:   Physical Exam BP 110/70 (BP Location: Right Arm, Patient Position: Sitting, Cuff Size: Large)   Pulse 91   Temp 98 F (36.7 C) (Oral)   Ht '5\' 4"'$  (1.626 m)   Wt 265 lb (120.2 kg)   LMP 10/14/2012 (Approximate)   BMI 45.49 kg/m  GENERAL: Chronically ill-appearing morbidly obese woman in no acute respiratory distress.  She presents in transport chair..  She is wearing oxygen via nasal cannula at 2 L/min.  HEAD: Normocephalic, atraumatic.  EYES: Pupils equal, round,  reactive to light.  No scleral icterus.  MOUTH: Oral mucosa moist no thrush. NECK: Thick, circumference 19 inches, supple. No thyromegaly. Trachea midline. No JVD.  No adenopathy. PULMONARY: Symmetrical air entry.  Coarse breath sounds but no wheezes noted.  Moving air fairly well. CARDIOVASCULAR: S1 and S2. Regular rate and rhythm.  No rubs or gallops heard.  Grade 2/6 systolic ejection murmur radiating to axilla. GASTROINTESTINAL: Obese abdomen, nondistended. MUSCULOSKELETAL: AV fistula left upper extremity with good thrill.  Right foot in orthopedic shoe.  Continues to see wound clinic. NEUROLOGIC: Awake, alert, no overt focal deficits.  Speech is fluent.  Gait not assessed. SKIN: Intact,warm,dry.  No overt rashes noted. PSYCH: Mood and behavior appropriate.  Insight: Poor   Lab Results  Component Value Date   NITRICOXIDE 21 11/19/2021       Assessment & Plan:     ICD-10-CM   1. Severe persistent asthmatic bronchitis without complication  Q67.61 Nitric oxide   She is well compensated in this regard Continue Perforomist, Pulmicort and Yupelri Continue as needed albuterol    2. Extreme obesity with alveolar hypoventilation (HCC)  E66.2    This issue adds complexity to her management Would benefit from weight loss    3. Pulmonary hypertension (HCC)  I27.20    This issue adds complexity to her management Multifactorial Diastolic dysfunction, obesity, sleep apnea    4. Seasonal allergic rhinitis due to pollen  J30.1    Recommended Zyrtec or Claritin as needed    5. OSA (obstructive sleep apnea)  G47.33    Advised to attend fitting for new mask with DME company We will gladly order her mask of choice Reiterated need for use of BiPAP      Orders Placed This Encounter  Procedures   Nitric oxide   We will see the patient in follow-up in 3 months time she is to contact us prior to that time should any new difficulties arise.  Renold Don, MD Advanced  Bronchoscopy PCCM [facility]    *This note was dictated using voice recognition software/Dragon.  Despite best efforts to proofread, errors can occur which can change the meaning. Any transcriptional errors that result from this process are unintentional and may not be fully corrected at the time of dictation.

## 2021-11-19 NOTE — Telephone Encounter (Signed)
Called [employer] to follow up on the order for the nasal pillow mask for the bipap that was placed in July of this year.  I was told they would have to check with Coralyn Mark and call me back.  Will await return call.

## 2021-11-19 NOTE — Patient Instructions (Signed)
Continue using your nebulizer.  Your asthma appears well controlled by the test that was done today.  Your symptoms are likely due to allergies, recommend Claritin or Zyrtec over-the-counter to control symptoms.  We will see you in follow-up in 3 months time call sooner should any new problems arise.

## 2021-11-20 DIAGNOSIS — Z992 Dependence on renal dialysis: Secondary | ICD-10-CM | POA: Diagnosis not present

## 2021-11-20 DIAGNOSIS — N186 End stage renal disease: Secondary | ICD-10-CM | POA: Diagnosis not present

## 2021-11-20 NOTE — Telephone Encounter (Signed)
Spoke to [place] with [place]. She stated that she would contact patient due to insurance requirements.

## 2021-11-21 DIAGNOSIS — E1122 Type 2 diabetes mellitus with diabetic chronic kidney disease: Secondary | ICD-10-CM | POA: Diagnosis not present

## 2021-11-21 DIAGNOSIS — Z992 Dependence on renal dialysis: Secondary | ICD-10-CM | POA: Diagnosis not present

## 2021-11-21 DIAGNOSIS — L97513 Non-pressure chronic ulcer of other part of right foot with necrosis of muscle: Secondary | ICD-10-CM | POA: Diagnosis not present

## 2021-11-21 DIAGNOSIS — E11621 Type 2 diabetes mellitus with foot ulcer: Secondary | ICD-10-CM | POA: Diagnosis not present

## 2021-11-21 DIAGNOSIS — N186 End stage renal disease: Secondary | ICD-10-CM | POA: Diagnosis not present

## 2021-11-21 DIAGNOSIS — Z794 Long term (current) use of insulin: Secondary | ICD-10-CM | POA: Diagnosis not present

## 2021-11-21 DIAGNOSIS — D631 Anemia in chronic kidney disease: Secondary | ICD-10-CM | POA: Diagnosis not present

## 2021-11-21 DIAGNOSIS — E1151 Type 2 diabetes mellitus with diabetic peripheral angiopathy without gangrene: Secondary | ICD-10-CM | POA: Diagnosis not present

## 2021-11-21 DIAGNOSIS — I5032 Chronic diastolic (congestive) heart failure: Secondary | ICD-10-CM | POA: Diagnosis not present

## 2021-11-21 DIAGNOSIS — I132 Hypertensive heart and chronic kidney disease with heart failure and with stage 5 chronic kidney disease, or end stage renal disease: Secondary | ICD-10-CM | POA: Diagnosis not present

## 2021-11-21 DIAGNOSIS — Z7952 Long term (current) use of systemic steroids: Secondary | ICD-10-CM | POA: Diagnosis not present

## 2021-11-21 DIAGNOSIS — Z9981 Dependence on supplemental oxygen: Secondary | ICD-10-CM | POA: Diagnosis not present

## 2021-11-22 DIAGNOSIS — N186 End stage renal disease: Secondary | ICD-10-CM | POA: Diagnosis not present

## 2021-11-22 DIAGNOSIS — Z992 Dependence on renal dialysis: Secondary | ICD-10-CM | POA: Diagnosis not present

## 2021-11-23 ENCOUNTER — Telehealth: Payer: Self-pay | Admitting: Pulmonary Disease

## 2021-11-23 DIAGNOSIS — E11621 Type 2 diabetes mellitus with foot ulcer: Secondary | ICD-10-CM | POA: Diagnosis not present

## 2021-11-23 DIAGNOSIS — E1151 Type 2 diabetes mellitus with diabetic peripheral angiopathy without gangrene: Secondary | ICD-10-CM | POA: Diagnosis not present

## 2021-11-23 DIAGNOSIS — N186 End stage renal disease: Secondary | ICD-10-CM | POA: Diagnosis not present

## 2021-11-23 DIAGNOSIS — I132 Hypertensive heart and chronic kidney disease with heart failure and with stage 5 chronic kidney disease, or end stage renal disease: Secondary | ICD-10-CM | POA: Diagnosis not present

## 2021-11-23 DIAGNOSIS — Z9981 Dependence on supplemental oxygen: Secondary | ICD-10-CM | POA: Diagnosis not present

## 2021-11-23 DIAGNOSIS — Z992 Dependence on renal dialysis: Secondary | ICD-10-CM | POA: Diagnosis not present

## 2021-11-23 DIAGNOSIS — D631 Anemia in chronic kidney disease: Secondary | ICD-10-CM | POA: Diagnosis not present

## 2021-11-23 DIAGNOSIS — E1122 Type 2 diabetes mellitus with diabetic chronic kidney disease: Secondary | ICD-10-CM | POA: Diagnosis not present

## 2021-11-23 DIAGNOSIS — Z7952 Long term (current) use of systemic steroids: Secondary | ICD-10-CM | POA: Diagnosis not present

## 2021-11-23 DIAGNOSIS — I5032 Chronic diastolic (congestive) heart failure: Secondary | ICD-10-CM | POA: Diagnosis not present

## 2021-11-23 DIAGNOSIS — L97513 Non-pressure chronic ulcer of other part of right foot with necrosis of muscle: Secondary | ICD-10-CM | POA: Diagnosis not present

## 2021-11-23 DIAGNOSIS — Z794 Long term (current) use of insulin: Secondary | ICD-10-CM | POA: Diagnosis not present

## 2021-11-23 NOTE — Telephone Encounter (Signed)
Please refer to 11/19/2021 phone note.   Patient is aware to contact [place] regarding mask. She voiced her understanding and had no further questions.  Nothing further needed.

## 2021-11-24 DIAGNOSIS — N186 End stage renal disease: Secondary | ICD-10-CM | POA: Diagnosis not present

## 2021-11-24 DIAGNOSIS — Z992 Dependence on renal dialysis: Secondary | ICD-10-CM | POA: Diagnosis not present

## 2021-11-25 DIAGNOSIS — Z992 Dependence on renal dialysis: Secondary | ICD-10-CM | POA: Diagnosis not present

## 2021-11-25 DIAGNOSIS — E8779 Other fluid overload: Secondary | ICD-10-CM | POA: Diagnosis not present

## 2021-11-25 DIAGNOSIS — I5040 Unspecified combined systolic (congestive) and diastolic (congestive) heart failure: Secondary | ICD-10-CM | POA: Diagnosis not present

## 2021-11-25 DIAGNOSIS — J45909 Unspecified asthma, uncomplicated: Secondary | ICD-10-CM | POA: Diagnosis not present

## 2021-11-25 DIAGNOSIS — N186 End stage renal disease: Secondary | ICD-10-CM | POA: Diagnosis not present

## 2021-11-27 DIAGNOSIS — E8779 Other fluid overload: Secondary | ICD-10-CM | POA: Diagnosis not present

## 2021-11-27 DIAGNOSIS — N186 End stage renal disease: Secondary | ICD-10-CM | POA: Diagnosis not present

## 2021-11-27 DIAGNOSIS — Z992 Dependence on renal dialysis: Secondary | ICD-10-CM | POA: Diagnosis not present

## 2021-11-29 DIAGNOSIS — Z992 Dependence on renal dialysis: Secondary | ICD-10-CM | POA: Diagnosis not present

## 2021-11-29 DIAGNOSIS — E8779 Other fluid overload: Secondary | ICD-10-CM | POA: Diagnosis not present

## 2021-11-29 DIAGNOSIS — N186 End stage renal disease: Secondary | ICD-10-CM | POA: Diagnosis not present

## 2021-11-30 ENCOUNTER — Encounter: Payer: Medicare Other | Attending: Physician Assistant | Admitting: Physician Assistant

## 2021-11-30 DIAGNOSIS — E1140 Type 2 diabetes mellitus with diabetic neuropathy, unspecified: Secondary | ICD-10-CM | POA: Insufficient documentation

## 2021-11-30 DIAGNOSIS — M86371 Chronic multifocal osteomyelitis, right ankle and foot: Secondary | ICD-10-CM | POA: Diagnosis not present

## 2021-11-30 DIAGNOSIS — I132 Hypertensive heart and chronic kidney disease with heart failure and with stage 5 chronic kidney disease, or end stage renal disease: Secondary | ICD-10-CM | POA: Insufficient documentation

## 2021-11-30 DIAGNOSIS — E11621 Type 2 diabetes mellitus with foot ulcer: Secondary | ICD-10-CM | POA: Insufficient documentation

## 2021-11-30 DIAGNOSIS — N186 End stage renal disease: Secondary | ICD-10-CM | POA: Diagnosis not present

## 2021-11-30 DIAGNOSIS — E1122 Type 2 diabetes mellitus with diabetic chronic kidney disease: Secondary | ICD-10-CM | POA: Insufficient documentation

## 2021-11-30 DIAGNOSIS — Z992 Dependence on renal dialysis: Secondary | ICD-10-CM | POA: Insufficient documentation

## 2021-11-30 DIAGNOSIS — M1990 Unspecified osteoarthritis, unspecified site: Secondary | ICD-10-CM | POA: Insufficient documentation

## 2021-11-30 DIAGNOSIS — Z9981 Dependence on supplemental oxygen: Secondary | ICD-10-CM | POA: Insufficient documentation

## 2021-11-30 DIAGNOSIS — L97514 Non-pressure chronic ulcer of other part of right foot with necrosis of bone: Secondary | ICD-10-CM | POA: Diagnosis not present

## 2021-11-30 DIAGNOSIS — E1151 Type 2 diabetes mellitus with diabetic peripheral angiopathy without gangrene: Secondary | ICD-10-CM | POA: Diagnosis not present

## 2021-11-30 DIAGNOSIS — I5042 Chronic combined systolic (congestive) and diastolic (congestive) heart failure: Secondary | ICD-10-CM | POA: Insufficient documentation

## 2021-11-30 DIAGNOSIS — L97512 Non-pressure chronic ulcer of other part of right foot with fat layer exposed: Secondary | ICD-10-CM | POA: Diagnosis not present

## 2021-11-30 NOTE — Progress Notes (Signed)
MARQUASIA, SCHMIEDER ([id number]) Visit Report for 11/30/2021 Arrival Information Details Patient Name: [name] [name] KUTNER. Date of Service: 11/30/2021 11:00 AM Medical Record Number: [id number] Patient Account Number: [id number] Date of Birth/Sex: 07-02-1970 (51 y.o. F) Treating RN: Carlene Coria Primary Care Elizabeth Paulsen: Evern Bio Other Clinician: Referring Colton Tassin: Evern Bio Treating Kaushik Maul/Extender: Skipper Cliche in Treatment: 28 Visit Information History Since Last Visit All ordered tests and consults were completed: No Patient Arrived: Other Added or deleted any medications: No Arrival Time: 11:16 Any new allergies or adverse reactions: No Accompanied By: husband Had a fall or experienced change in No Transfer Assistance: None activities of daily living that may affect Patient Identification Verified: Yes risk of falls: Secondary Verification Process Completed: Yes Signs or symptoms of abuse/neglect since last visito No Patient Requires Transmission-Based Precautions: No Hospitalized since last visit: No Patient Has Alerts: No Implantable device outside of the clinic excluding No cellular tissue based products placed in the center since last visit: Has Dressing in Place as Prescribed: Yes Pain Present Now: Yes Electronic Signature(s) Signed: 11/30/2021 12:45:55 PM By: Carlene Coria RN Entered By: Carlene Coria on 11/30/2021 11:21:02 [name] [name] ([id number]) -------------------------------------------------------------------------------- Clinic Level of Care Assessment Details Patient Name: [name] [name]. Date of Service: 11/30/2021 11:00 AM Medical Record Number: [id number] Patient Account Number: [id number] Date of Birth/Sex: 31-Jul-1970 (51 y.o. F) Treating RN: Carlene Coria Primary Care Marylen Zuk: Evern Bio Other Clinician: Referring Holley Kocurek: Evern Bio Treating Dea Bitting/Extender: Skipper Cliche in Treatment: 28 Clinic Level of Care  Assessment Items TOOL 4 Quantity Score X - Use when only an EandM is performed on FOLLOW-UP visit 1 0 ASSESSMENTS - Nursing Assessment / Reassessment X - Reassessment of Co-morbidities (includes updates in patient status) 1 10 X- 1 5 Reassessment of Adherence to Treatment Plan ASSESSMENTS - Wound and Skin Assessment / Reassessment X - Simple Wound Assessment / Reassessment - one wound 1 5 '[]'$  - 0 Complex Wound Assessment / Reassessment - multiple wounds '[]'$  - 0 Dermatologic / Skin Assessment (not related to wound area) ASSESSMENTS - Focused Assessment '[]'$  - Circumferential Edema Measurements - multi extremities 0 '[]'$  - 0 Nutritional Assessment / Counseling / Intervention '[]'$  - 0 Lower Extremity Assessment (monofilament, tuning fork, pulses) '[]'$  - 0 Peripheral Arterial Disease Assessment (using hand held doppler) ASSESSMENTS - Ostomy and/or Continence Assessment and Care '[]'$  - Incontinence Assessment and Management 0 '[]'$  - 0 Ostomy Care Assessment and Management (repouching, etc.) PROCESS - Coordination of Care X - Simple Patient / Family Education for ongoing care 1 15 '[]'$  - 0 Complex (extensive) Patient / Family Education for ongoing care '[]'$  - 0 Staff obtains Programmer, systems, Records, Test Results / Process Orders '[]'$  - 0 Staff telephones HHA, Nursing Homes / Clarify orders / etc '[]'$  - 0 Routine Transfer to another Facility (non-emergent condition) '[]'$  - 0 Routine Hospital Admission (non-emergent condition) '[]'$  - 0 New Admissions / Biomedical engineer / Ordering NPWT, Apligraf, etc. '[]'$  - 0 Emergency Hospital Admission (emergent condition) X- 1 10 Simple Discharge Coordination '[]'$  - 0 Complex (extensive) Discharge Coordination PROCESS - Special Needs '[]'$  - Pediatric / Minor Patient Management 0 '[]'$  - 0 Isolation Patient Management '[]'$  - 0 Hearing / Language / Visual special needs '[]'$  - 0 Assessment of Community assistance (transportation, D/C planning, etc.) '[]'$  - 0 Additional  assistance / Altered mentation '[]'$  - 0 Support Surface(s) Assessment (bed, cushion, seat, etc.) INTERVENTIONS - Wound Cleansing / Measurement Mcgeehan, Djeneba M. ([id number]) X- 1 5 Simple Wound Cleansing -  one wound '[]'$  - 0 Complex Wound Cleansing - multiple wounds X- 1 5 Wound Imaging (photographs - any number of wounds) '[]'$  - 0 Wound Tracing (instead of photographs) X- 1 5 Simple Wound Measurement - one wound '[]'$  - 0 Complex Wound Measurement - multiple wounds INTERVENTIONS - Wound Dressings X - Small Wound Dressing one or multiple wounds 1 10 '[]'$  - 0 Medium Wound Dressing one or multiple wounds '[]'$  - 0 Large Wound Dressing one or multiple wounds X- 1 5 Application of Medications - topical '[]'$  - 0 Application of Medications - injection INTERVENTIONS - Miscellaneous '[]'$  - External ear exam 0 '[]'$  - 0 Specimen Collection (cultures, biopsies, blood, body fluids, etc.) '[]'$  - 0 Specimen(s) / Culture(s) sent or taken to Lab for analysis '[]'$  - 0 Patient Transfer (multiple staff / Civil Service fast streamer / Similar devices) '[]'$  - 0 Simple Staple / Suture removal (25 or less) '[]'$  - 0 Complex Staple / Suture removal (26 or more) '[]'$  - 0 Hypo / Hyperglycemic Management (close monitor of Blood Glucose) '[]'$  - 0 Ankle / Brachial Index (ABI) - do not check if billed separately X- 1 5 Vital Signs Has the patient been seen at the hospital within the last three years: Yes Total Score: 80 Level Of Care: New/Established - Level 3 Electronic Signature(s) Signed: 11/30/2021 12:45:55 PM By: Carlene Coria RN Entered By: Carlene Coria on 11/30/2021 12:04:49 [name] [name] ([id number]) -------------------------------------------------------------------------------- Encounter Discharge Information Details Patient Name: [name] [name]. Date of Service: 11/30/2021 11:00 AM Medical Record Number: [id number] Patient Account Number: [id number] Date of Birth/Sex: 02/08/1971 (51 y.o. F) Treating RN: Carlene Coria Primary  Care Chayanne Filippi: Evern Bio Other Clinician: Referring Landry Kamath: Evern Bio Treating Jaysion Ramseyer/Extender: Skipper Cliche in Treatment: 28 Encounter Discharge Information Items Discharge Condition: Stable Ambulatory Status: Other Discharge Destination: Home Transportation: Private Auto Accompanied By: husband Schedule Follow-up Appointment: Yes Clinical Summary of Care: Electronic Signature(s) Signed: 11/30/2021 12:45:55 PM By: Carlene Coria RN Entered By: Carlene Coria on 11/30/2021 12:06:00 [name] [name] ([id number]) -------------------------------------------------------------------------------- Lower Extremity Assessment Details Patient Name: [name] [name]. Date of Service: 11/30/2021 11:00 AM Medical Record Number: [id number] Patient Account Number: [id number] Date of Birth/Sex: 02-05-1971 (50 y.o. F) Treating RN: Carlene Coria Primary Care Trish Mancinelli: Evern Bio Other Clinician: Referring Dacy Enrico: Evern Bio Treating Muntaha Vermette/Extender: Jeri Cos Weeks in Treatment: 28 Edema Assessment Assessed: [Left: No] [Right: No] Edema: [Left: Ye] [Right: s] Vascular Assessment Pulses: Dorsalis Pedis Doppler Audible: [Right:Yes] Electronic Signature(s) Signed: 11/30/2021 12:45:55 PM By: Carlene Coria RN Entered By: Carlene Coria on 11/30/2021 11:32:27 [name] [name] ([id number]) -------------------------------------------------------------------------------- Multi Wound Chart Details Patient Name: [name] [name]. Date of Service: 11/30/2021 11:00 AM Medical Record Number: [id number] Patient Account Number: [id number] Date of Birth/Sex: December 05, 1970 (50 y.o. 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 Comorbid History: Cataracts, Anemia, Asthma, Chronic N/A N/A Obstructive Pulmonary Disease (COPD), Congestive Heart Failure, Hypertension, Type II Diabetes, End Stage Renal Disease, Osteoarthritis, Neuropathy Date Acquired: 10/15/2020 N/A N/A Weeks of Treatment: 28 N/A N/A Wound Status: Open N/A N/A Wound Recurrence: No N/A N/A Pending Amputation on Yes N/A N/A Presentation: Measurements L x W x  D (cm) 12x23x0.6 N/A N/A Area (cm) : 216.77 N/A N/A Volume (cm) : 130.062 N/A N/A % Reduction in Area: -104.40% N/A N/A % Reduction in Volume: 38.70% N/A N/A Classification: Grade 4 N/A N/A Exudate Amount: Medium N/A N/A Exudate Type: Serosanguineous N/A N/A Exudate Color: red, brown N/A N/A Granulation Amount: Medium (34-66%) N/A N/A Necrotic Amount: Medium (34-66%) N/A N/A Necrotic Tissue: Eschar, Adherent Slough N/A N/A Exposed Structures: Fat Layer (Subcutaneous Tissue): N/A N/A Yes Fascia: No Tendon: No Muscle: No Joint: No Bone: No Treatment Notes Electronic Signature(s) Signed: 11/30/2021 12:45:55 PM By: Carlene Coria RN [name] [name] ([id number]) Entered By: Carlene Coria on 11/30/2021 12:04:05 Friedhoff, [name] [name] ([id number]) -------------------------------------------------------------------------------- [place] Details Patient Name: [name] [name]. Date of Service: 11/30/2021 11:00 AM Medical Record Number: [id number] Patient Account Number: [id number] Date of Birth/Sex: 1970-12-13 (51 y.o. F) Treating RN: Carlene Coria Primary Care Abigayl Hor: Evern Bio Other Clinician: Referring Catilyn Boggus: Evern Bio Treating Cerria Randhawa/Extender: Skipper Cliche in Treatment: 28 Active Inactive HBO Nursing  Diagnoses: Anxiety related to feelings of confinement associated with the hyperbaric oxygen chamber Anxiety related to knowledge deficit of hyperbaric oxygen therapy and treatment procedures Discomfort related to temperature and humidity changes inside hyperbaric chamber Potential for barotraumas to ears, sinuses, teeth, and lungs or cerebral gas embolism related to changes in atmospheric pressure inside hyperbaric oxygen chamber Potential for oxygen toxicity seizures related to delivery of 100% oxygen at an increased atmospheric pressure Potential for pulmonary oxygen toxicity related to delivery of 100% oxygen at an increased atmospheric pressure Goals: Barotrauma will be prevented during HBO2 Date Initiated: 06/05/2021 Target Resolution Date: 06/05/2021 Goal Status: Active Patient and/or family will be able to state/discuss factors appropriate to the management of their disease process during treatment Date Initiated: 06/05/2021 Target Resolution Date: 06/05/2021 Goal Status: Active Patient will tolerate the hyperbaric oxygen therapy treatment Date Initiated: 06/05/2021 Target Resolution Date: 06/05/2021 Goal Status: Active Patient will tolerate the internal climate of the chamber Date Initiated: 06/05/2021 Target Resolution Date: 06/05/2021 Goal Status: Active Patient/caregiver will verbalize understanding of HBO goals, rationale, procedures and potential hazards Date Initiated: 06/05/2021 Target Resolution Date: 06/05/2021 Goal Status: Active Signs and symptoms of pulmonary oxygen toxicity will be recognized and promptly addressed Date Initiated: 06/05/2021 Target Resolution Date: 06/05/2021 Goal Status: Active Signs and symptoms of seizure will be recognized and promptly addressed ; seizing patients will suffer no harm Date Initiated: 06/05/2021 Target Resolution Date: 06/05/2021 Goal Status: Active Interventions: Administer a five (5) minute air break for patient if signs and symptoms  of seizure appear and notify the hyperbaric physician Administer decongestants, per physician orders, prior to HBO2 Administer the correct therapeutic gas delivery based on the patients needs and limitations, per physician order Assess and provide for patientos comfort related to the hyperbaric environment and equalization of middle ear Assess for signs and symptoms related to adverse events, including but not limited to confinement anxiety, pneumothorax, oxygen toxicity and baurotrauma Assess patient for any history of confinement anxiety Assess patient's knowledge and expectations regarding hyperbaric medicine and provide education related to the hyperbaric environment, goals of treatment and prevention of adverse events Implement protocols to decrease risk of pneumothorax in high risk patients Notes: Osteomyelitis Nursing Diagnoses: Infection: osteomyelitis [name] [name] ([id number]) Knowledge deficit related to disease process and management Potential for infection: osteomyelitis Goals: Diagnostic evaluation for osteomyelitis completed as ordered Date Initiated: 06/05/2021 Target Resolution Date: 06/05/2021 Goal Status: Active Patient/caregiver will verbalize understanding of disease process and disease management Date  Initiated: 06/05/2021 Target Resolution Date: 06/05/2021 Goal Status: Active [name] osteomyelitis will resolve Date Initiated: 06/05/2021 Target Resolution Date: 06/05/2021 Goal Status: Active Signs and symptoms for osteomyelitis will be recognized and promptly addressed Date Initiated: 06/05/2021 Target Resolution Date: 06/05/2021 Goal Status: Active Interventions: Assess for signs and symptoms of osteomyelitis resolution every visit Provide education on osteomyelitis Notes: Wound/Skin Impairment Nursing Diagnoses: Impaired tissue integrity Knowledge deficit related to ulceration/compromised skin integrity Goals: Ulcer/skin breakdown will have a volume  reduction of 30% by week 4 Date Initiated: 05/18/2021 Target Resolution Date: 06/15/2021 Goal Status: Active Ulcer/skin breakdown will have a volume reduction of 50% by week 8 Date Initiated: 05/18/2021 Target Resolution Date: 07/13/2021 Goal Status: Active Ulcer/skin breakdown will have a volume reduction of 80% by week 12 Date Initiated: 05/18/2021 Target Resolution Date: 08/10/2021 Goal Status: Active Ulcer/skin breakdown will heal within 14 weeks Date Initiated: 05/18/2021 Target Resolution Date: 08/24/2021 Goal Status: Active Interventions: Assess patient/caregiver ability to obtain necessary supplies Assess patient/caregiver ability to perform ulcer/skin care regimen upon admission and as needed Assess ulceration(s) every visit Provide education on ulcer and skin care Notes: Electronic Signature(s) Signed: 11/30/2021 12:45:55 PM By: Carlene Coria RN Entered By: Carlene Coria on 11/30/2021 11:33:04 [name] [name] ([id number]) -------------------------------------------------------------------------------- Pain Assessment Details Patient Name: [name] [name]. Date of Service: 11/30/2021 11:00 AM Medical Record Number: [id number] Patient Account Number: [id number] Date of Birth/Sex: March 29, 1970 (51 y.o. F) Treating RN: Carlene Coria Primary Care Olga Seyler: Evern Bio Other Clinician: Referring Laith Antonelli: Evern Bio Treating Camden Knotek/Extender: Skipper Cliche in Treatment: 28 Active Problems Location of Pain Severity and Description of Pain Patient Has Paino Yes Site Locations With Dressing Change: Yes Duration of the Pain. Constant / Intermittento Intermittent Rate the pain. Current Pain Level: 4 Worst Pain Level: 7 Least Pain Level: 0 Tolerable Pain Level: 5 Character of Pain Describe the Pain: Burning, Sharp Pain Management and Medication Current Pain Management: Medication: Yes Cold Application: No Rest: Yes Massage: No Activity: No T.E.N.S.: No Heat  Application: No Leg drop or elevation: No Is the Current Pain Management Adequate: Inadequate How does your wound impact your activities of daily livingo Sleep: Yes Bathing: No Appetite: No Relationship With Others: No Bladder Continence: No Emotions: No Bowel Continence: No Work: No Toileting: No Drive: No Dressing: No Hobbies: No Electronic Signature(s) Signed: 11/30/2021 12:45:55 PM By: Carlene Coria RN Entered By: Carlene Coria on 11/30/2021 11:22:14 [name] [name] ([id number]) -------------------------------------------------------------------------------- Patient/Caregiver Education Details Patient Name: [name] [name]. Date of Service: 11/30/2021 11:00 AM Medical Record Number: [id number] Patient Account Number: [id number] Date of Birth/Gender: 1971/02/14 (51 y.o. F) Treating RN: Carlene Coria Primary Care Physician: Evern Bio Other Clinician: Referring Physician: Evern Bio Treating Physician/Extender: Skipper Cliche in Treatment: 28 Education Assessment Education Provided To: Patient Education Topics Provided Infection: Methods: Explain/Verbal Responses: State content correctly Electronic Signature(s) Signed: 11/30/2021 12:45:55 PM By: Carlene Coria RN Entered By: Carlene Coria on 11/30/2021 12:05:17 [name] [name] ([id number]) -------------------------------------------------------------------------------- Wound Assessment Details Patient Name: [name] [name]. Date of Service: 11/30/2021 11:00 AM Medical Record Number: [id number] Patient Account Number: [id number] Date of Birth/Sex: 12/15/1970 (51 y.o. F) Treating RN: Carlene Coria Primary Care Amiri Riechers: Evern Bio Other Clinician: Referring Moya Duan: Evern Bio Treating Starkisha Tullis/Extender: Jeri Cos Weeks in Treatment: 28 Wound Status Wound Number: 2 Primary Diabetic Wound/Ulcer of the Lower Extremity Etiology: Wound Location: Right, Circumferential Foot Wound Open Wounding Event:  Gradually Appeared Status: Date Acquired: 10/15/2020 Notes: open areas to midline right foot, lateral right foot, and top  Weeks Of Treatment: 28 of right foot Clustered Wound: No Comorbid Cataracts, Anemia, Asthma, Chronic Obstructive Pulmonary Pending Amputation On Presentation History: Disease (COPD), Congestive Heart Failure, Hypertension, Type II Diabetes, End Stage Renal Disease, Osteoarthritis, Neuropathy Photos Wound Measurements Length: (cm) 12 Width: (cm) 23 Depth: (cm) 0.6 Area: (cm) 216.77 Volume: (cm) 130.062 % Reduction in Area: -104.4% % Reduction in Volume: 38.7% Tunneling: No Undermining: No Wound Description Classification: Grade 4 Fo Exudate Amount: Medium Sl Exudate Type: Serosanguineous Exudate Color: red, brown ul Odor After Cleansing: No ough/Fibrino Yes Wound Bed Granulation Amount: Medium (34-66%) Exposed Structure Necrotic Amount: Medium (34-66%) Fascia Exposed: No Necrotic Quality: Eschar, Adherent Slough Fat Layer (Subcutaneous Tissue) Exposed: Yes Tendon Exposed: No Muscle Exposed: No Joint Exposed: No Bone Exposed: No Treatment Notes Wound #2 (Foot) Wound Laterality: Right, Circumferential Cleanser Dakin 16 (oz) 0.25 Stafford, Mahayla M. ([id number]) Discharge Instruction: wet/dry in open areas of foot. Normal Saline Discharge Instruction: Wash your hands with soap and water. Remove old dressing, discard into plastic bag and place into trash. Cleanse the wound with Normal Saline prior to applying a clean dressing using gauze sponges, not tissues or cotton balls. Do not scrub or use excessive force. Pat dry using gauze sponges, not tissue or cotton balls. Soap and Water Discharge Instruction: Gently cleanse wound with antibacterial soap, rinse and pat dry prior to dressing wounds Peri-Wound Care Topical Betadine Discharge Instruction: Apply betadine as directed. apply to gauze and pack sites and lay op open areas to top of foot Primary  Dressing Gauze Discharge Instruction: As directed: betadine applied Secondary Dressing ABD Pad 5x9 (in/in) Discharge Instruction: Cover with ABD pad Secured With Medipore Tape - 87M Medipore H Soft Cloth Surgical Tape, 2x2 (in/yd) Kerlix Roll Sterile or Non-Sterile 6-ply 4.5x4 (yd/yd) Discharge Instruction: Apply Kerlix as directed Stretch Net Dressing, Latex-free, Size 5, Small-Head / Shoulder / Thigh Compression Wrap Compression Stockings Add-Ons Electronic Signature(s) Signed: 11/30/2021 12:45:55 PM By: Carlene Coria RN Entered By: Carlene Coria on 11/30/2021 11:28:08 Modi, [name] [name] ([id number]) -------------------------------------------------------------------------------- Vitals Details Patient Name: Abdella, [name] [name]. Date of Service: 11/30/2021 11:00 AM Medical Record Number: [id number] Patient Account Number: [id number] Date of Birth/Sex: 03/18/1970 (51 y.o. F) Treating RN: Carlene Coria Primary Care Oluwasemilore Pascuzzi: Evern Bio Other Clinician: Referring Koi Yarbro: Evern Bio Treating Jerald Villalona/Extender: Jeri Cos Weeks in Treatment: 28 Vital Signs Time Taken: 11:21 Temperature (F): 97.9 Height (in): 64 Pulse (bpm): 105 Weight (lbs): 259 Respiratory Rate (breaths/min): 18 Body Mass Index (BMI): 44.5 Blood Pressure (mmHg): 155/78 Reference Range: 80 - 120 mg / dl Electronic Signature(s) Signed: 11/30/2021 12:45:55 PM By: Carlene Coria RN Entered By: Carlene Coria on 11/30/2021 11:21:30

## 2021-11-30 NOTE — Progress Notes (Addendum)
[name] ([id number]) Visit Report for 11/30/2021 Chief Complaint Document Details Patient Name: [name], [name] Date of Service: 11/30/2021 11:00 AM Medical Record Number: [id number] Patient Account Number: [id number] Date of Birth/Sex: 1970/10/29 (51 y.o. F) Treating RN: Carlene Coria Primary Care Provider: Evern Bio Other Clinician: Referring Provider: Evern Bio Treating Provider/Extender: Jeri Cos Weeks in Treatment: 28 Information Obtained from: Patient Chief Complaint Right foot gangrene and osteomyelitis Electronic Signature(s) Signed: 11/30/2021 11:53:41 AM By: Worthy Keeler PA-C Entered By: Worthy Keeler on 11/30/2021 11:53:41 [name], [name] ([id number]) -------------------------------------------------------------------------------- HPI Details Patient Name: [name] Date of Service: 11/30/2021 11:00 AM Medical Record Number: [id number] Patient Account Number: [id number] Date of Birth/Sex: 1970/06/08 (51 y.o. F) Treating RN: Carlene Coria Primary Care Provider: Evern Bio Other Clinician: Referring Provider: Evern Bio Treating Provider/Extender: Skipper Cliche in Treatment: 28 History of Present Illness HPI Description: 10/04/2020 upon evaluation today patient presents for initial inspection here in the clinic concerning a fairly concerning wound over her right fifth toe towards the medial aspect which actually probes all the way down to bone. There is evidence of necrotic muscle as well and I do see tendon exposed as well. Subsequently this does have me very concerned about where things stand here. Again this is the first time that I am seeing her and on top of everything else her ABIs are noncompressible. I think that arterial flow is the primary concern here. Of note the patient states this was first noted July 1. She had a x-ray on July 4 which was negative for bone infection. She has been on 2 antibiotics she is unsure of the  name. With that being said her most recent hemoglobin A1c was 8.1 that was December 2022. She otherwise does have a history obviously of diabetes mellitus type 2, hypertension, end-stage renal disease with dependence on renal dialysis, and she is also dependent on supplemental oxygen. Readmission: 05/14/2021 this is a patient whom I previously seen October 04, 2020. With that being said she is subsequently on 21 October 2020 ended up having an amputation of her fifth digit of her foot unfortunately. This had progressed fairly quickly into what sounds to been gangrene at that time. Subsequently right now based on what I am seeing the patient actually has a necrotic area on the end of her foot and has been recommended that the only option really here is going to be a amputation which would be a below-knee amputation. She is really not interested in that and wants to try to give this every chance it can to heal. For that reason I discussed with her that definitely we can see what we can do to try to improve this situation and try to help get this doing better as best we can. With that being said there is definitely a portion of her wound that is just not good to be able to heal no matter what it is already starting to auto amputate. This includes potentially digits of her foot as well based on what I am seeing. She does have confirmed osteomyelitis noted by MRI. She is also currently on IV vancomycin. She does undergo dialysis on Tuesday, Thursday, and Saturday. The patient does have a history of diabetes mellitus type 2, congestive heart failure, hypertension, end-stage renal disease, dependence on renal dialysis, and dependence on supplemental oxygen. With that being said I am going to need to look into her heart failure and the significance of this as far as hyperbarics  is concerned before we initiate treatment. She is probably also going to need an updated EKG and chest x-ray unless its already been  done recently. 05/28/2021 upon evaluation today patient appears to be doing well with regard to her wound all things considered. I am going to try to see what we can do about trimmed away some of the necrotic tissue that is really just hanging on here and there and not really helpful. Feel like if we can slowly start doing this maybe we will be able to get to the point where this is better and better as far as cleaning up the surface of the wound is concerned. Still as I explained I do believe some of her toes are not salvageable but again she is looking more towards autoamputation versus want to do anything definitive here from a surgical standpoint. I have been researching whether she would be an okay candidate for hyperbarics or not and I found that her x-ray but she had a chest x-ray recently was negative for any acute disease and appear to be stable. EKG was also stable. She did also have a cardiac echo which showed that her ejection fraction was 60 to 65% and therefore I think she is a candidate for hyperbarics. Again I do believe that this can help with preventing amputation which right now she has been told that her only option would be a below-knee amputation. Nonetheless I think that if we can get the heel leading to spread and the majority of this cleared away that it is possible we might even be able to get a surgeon just to surgically remove the nonviable toes and not have to go the full way of complete amputation. Nonetheless we do have a ways to go before we will get to that point. Either way I think the hyperbarics could be an excellent support as far as that is concerned. 06-05-2021 upon evaluation today patient's wound is showing some signs of improvement. This does seem to be slow and of course it is understandable considering what we are seeing. She has discussed and looked up information about hyperbarics and in the end comes back today and states that she does want to proceed. I  honestly do feel like that is her best chance at saving the foot. Even if we are able to get her into a surgeon after we obtain some good healing approximately so that there is not as much necrotic tissue in general she may even be able to have a transmetatarsal amputation and get this to heal. Again I am not saying this is can be an easy road but it may be her best road as far as thinking about how to achieve some type of complete healing as I do not believe the toes are viable at all to be honest. 07-06-2021 upon evaluation today patient appears to be doing well currently in regard to her foot all things considered. Again she unfortunately was unable to do the hyperbaric oxygen therapy this was due to her claustrophobia. Nonetheless she tells me that she is pleased with how the foot appears and she does feel like there is good improvement going on here. In general I think that we are on the right track. If she is going to take a very long time and to be honest the route of autoamputation which is pretty much the way this is going is not a quick or easy road but nonetheless she seems to be taking care of her  foot quite well. She is in good spirits. 07-27-2021 upon evaluation today patient appears to be doing well currently in regard to her foot all things considered. A lot of the eschar is starting to lift up around the edges which is good there is still significant necrotic tissue on the distal portion of the foot. This is slowly going to work its way off. Based on what I am seeing currently I do not believe that there is any evidence of infection which is good news. 08-31-2021 upon evaluation today patient's wound actually appears to be doing decently well. I do not see anything in particular to try to trim away today. With that being said she continues to slowly allow this to do what is going to do as far as autoamputation is concerned. I did have another conversation with her today about the  possibility of seeing a surgeon for a transmetatarsal amputation which again would help to get this to close faster and obviously I think would be a much speedy year and safer way to proceed with amputation. With that being said she tells me that she would really prefer to just let this go at "God pace". 09-28-2021 upon evaluation today patient's foot actually appears to be doing much better. Fortunately I do not see any signs of infection which is great news and overall I am extremely pleased with where we stand today. There does not appear to be any signs of active infection locally or Derflinger, Keisi M. ([id number]) systemically which is great news. No fevers, chills, nausea, vomiting, or diarrhea. 10-26-2021 upon evaluation today patient appears to be doing well currently in regard to her foot all things considered. Fortunately I do not see any evidence of active infection locally or systemically at this time which is great news. No fevers, chills, nausea, vomiting, or diarrhea. She does have some of the eschar that is lifting up even a little bit more compared to last time I saw her. 11-30-2021 upon evaluation today patient's wound is still continuing to have some areas that lift up although there is nothing really that I feel like I could trim away too much today to be honest. Fortunately I do not see any evidence of systemic infection though locally she has been concerned about some drainage I do not see anything that seems to be actively red or erythematous or warm to touch which is good news. No fevers, chills, nausea, vomiting, or diarrhea. Electronic Signature(s) Signed: 11/30/2021 1:33:15 PM By: Worthy Keeler PA-C Entered By: Worthy Keeler on 11/30/2021 13:33:14 [name], [name] ([id number]) -------------------------------------------------------------------------------- Physical Exam Details Patient Name: Pappalardo, [name] Date of Service: 11/30/2021 11:00 AM Medical Record Number:  [id number] Patient Account Number: [id number] Date of Birth/Sex: [birth year] (51 y.o. F) Treating RN: Carlene Coria Primary Care Provider: Evern Bio Other Clinician: Referring Provider: Evern Bio Treating Provider/Extender: Jeri Cos Weeks in Treatment: 75 Constitutional Well-nourished and well-hydrated in no acute distress. Respiratory normal breathing without difficulty. Psychiatric this patient is able to make decisions and demonstrates good insight into disease process. Alert and Oriented x 3. pleasant and cooperative. Notes Upon inspection patient's wound bed actually showed signs of good granulation and epithelization at this point in some areas although the distal aspect of her foot is still necrotic and to be honest really needs to be removed but she is still adamantly opposed to any type of surgery. I had a discussion with her about that again today and she tells me that she does  not want to see a Psychologist, sport and exercise. Electronic Signature(s) Signed: 11/30/2021 1:33:43 PM By: Worthy Keeler PA-C Entered By: Worthy Keeler on 11/30/2021 13:33:42 [name], [name] ([id number]) -------------------------------------------------------------------------------- Physician Orders Details Patient Name: [name], [name] Date of Service: 11/30/2021 11:00 AM Medical Record Number: [id number] Patient Account Number: [id number] Date of Birth/Sex: Apr 28, 1970 (51 y.o. F) Treating RN: Carlene Coria Primary Care Provider: Evern Bio Other Clinician: Referring Provider: Evern Bio Treating Provider/Extender: Skipper Cliche in Treatment: 28 Verbal / Phone Orders: No Diagnosis Coding ICD-10 Coding Code Description M86.371 Chronic multifocal osteomyelitis, right ankle and foot E11.621 Type 2 diabetes mellitus with foot ulcer L97.514 Non-pressure chronic ulcer of other part of right foot with necrosis of bone I50.42 Chronic combined systolic (congestive) and diastolic (congestive) heart  failure I10 Essential (primary) hypertension N18.6 End stage renal disease Z99.2 Dependence on renal dialysis Z99.81 Dependence on supplemental oxygen Follow-up Appointments o Return Appointment in 3 weeks. o Nurse Visit as needed [place] for wound care. May utilize formulary equivalent dressing for wound treatment orders unless otherwise specified. Home Health Nurse may visit [facility] to address patientos wound care needs. o Scheduled days for dressing changes to be completed; exception, patient has scheduled wound care visit that day. o **Please direct any NON-WOUND related issues/requests for orders to patient's Primary Care Physician. **If current dressing causes regression in wound condition, may D/C ordered dressing product/s and apply Normal Saline Moist Dressing daily until next [place] or Other MD appointment. **Notify [facility] of regression in wound condition at [phone]. Bathing/ Shower/ Hygiene o Clean wound with Normal Saline or wound cleanser. o Wash wounds with antibacterial soap and water. o May shower; gently cleanse wound with antibacterial soap, rinse and pat dry prior to dressing wounds o No tub bath. Off-Loading o Open toe surgical shoe - provided at visit Hyperbaric Oxygen Therapy o Evaluate for HBO Therapy o Indication and location: - right foot o If appropriate for treatment, begin HBOT per protocol: o 2.5 ATA for 90 Minutes with 2 Five (5) Minute Air Breaks o One treatment per day (delivered Monday through Friday unless otherwise specified in Special Instructions below): o Total # of Treatments: - 40 o Finger stick Blood Glucose Pre- and Post- HBOT Treatment. o Follow Hyperbaric Oxygen Glycemia Protocol Wound Treatment Wound #2 - Foot Wound Laterality: Right, Circumferential Cleanser: Dakin 16 (oz) 0.25 1 x Per Day/30 Days Discharge  Instructions: wet/dry in open areas of foot. Cleanser: Normal Saline 1 x Per Day/30 Days Discharge Instructions: Wash your hands with soap and water. Remove old dressing, discard into plastic bag and place into trash. Cleanse the wound with Normal Saline prior to applying a clean dressing using gauze sponges, not tissues or cotton balls. Do not scrub or use excessive force. Pat dry using gauze sponges, not tissue or cotton balls. Cleanser: Soap and Water 1 x Per Day/30 Days [name], [name] PUNT. ([id number]) Discharge Instructions: Gently cleanse wound with antibacterial soap, rinse and pat dry prior to dressing wounds Topical: Betadine 1 x Per Day/30 Days Discharge Instructions: Apply betadine as directed. apply to gauze and pack sites and lay op open areas to top of foot Primary Dressing: Gauze 1 x Per Day/30 Days Discharge Instructions: As directed: betadine applied Secondary Dressing: ABD Pad 5x9 (in/in) 1 x Per Day/30 Days Discharge Instructions: Cover with ABD pad Secured With: Medipore Tape - 40M Medipore H Soft Cloth Surgical Tape, 2x2 (in/yd)  1 x Per Day/30 Days Secured With: Kerlix Roll Sterile or Non-Sterile 6-ply 4.5x4 (yd/yd) 1 x Per Day/30 Days Discharge Instructions: Apply Kerlix as directed Secured With: Stretch Net Dressing, Latex-free, Size 5, Small-Head / Shoulder / Thigh 1 x Per Day/30 Days Patient Medications Allergies: Sulfa (Sulfonamide Antibiotics), Feraheme, pollen extracts, mold extracts, house dust mite Notifications Medication Indication Start End doxycycline hyclate 11/30/2021 DOSE 1 - oral 100 mg capsule - 1 capsule oral taken 2 times per day for 30 days. Do not take at the same time as the [place] pre-HBO capillary blood glucose (ensure 1 physician order is in chart). A. Notify HBO physician and await physician orders. 2 If result is 70 mg/dl or below: B. If the result  meets the hospital definition of a critical result, follow hospital policy. A. Give patient an 8 ounce Glucerna Shake, an 8 ounce Ensure, or 8 ounces of a Glucerna/Ensure equivalent dietary supplement*. B. Wait 30 minutes. If result is 71 mg/dl to 130 mg/dl: C. Retest patientos capillary blood glucose (CBG). D. If result greater than or equal to 110 mg/dl, proceed with HBO. If result less than 110 mg/dl, notify HBO physician and consider holding HBO. If result is 131 mg/dl to 249 mg/dl: A. Proceed with HBO. A. Notify HBO physician and await physician orders. B. It is recommended to hold HBO and do If result is 250 mg/dl or greater: blood/urine ketone testing. C. If the result meets the hospital definition of a critical result, follow hospital policy. POST-HBO GLYCEMIA INTERVENTIONS ACTION INTERVENTION Obtain post HBO capillary blood glucose (ensure 1 physician order is in chart). A. Notify HBO physician and await physician orders. 2 If result is 70 mg/dl or below: B. If the result meets the hospital definition of a critical result, follow hospital policy. If result is 71 mg/dl to 100 mg/dl: A. Give patient an 8 ounce Glucerna Shake, an 8 ounce Ensure, or 8 ounces of a Glucerna/Ensure equivalent dietary supplement*. B. Wait 15 minutes for symptoms of hypoglycemia (i.e. nervousness, anxiety, Dittmer, Gerlene M. ([id number]) sweating, chills, clamminess, irritability, confusion, tachycardia or dizziness). C. If patient asymptomatic, discharge patient. If patient symptomatic, repeat capillary blood glucose (CBG) and notify HBO physician. If result is 101 mg/dl to 249 mg/dl: A. Discharge patient. A. Notify HBO physician and await physician orders. B. It is recommended to do blood/urine If result is 250 mg/dl or greater: ketone testing. C. If the result meets the hospital definition of a critical result, follow hospital policy. *Juice or candies are NOT equivalent  products. If patient refuses the Glucerna or Ensure, please consult the hospital dietitian for an appropriate substitute. Electronic Signature(s) Signed: 11/30/2021 1:37:45 PM By: Worthy Keeler PA-C Previous Signature: 11/30/2021 12:45:55 PM Version By: Carlene Coria RN Entered By: Worthy Keeler on 11/30/2021 13:37:44 [name], [name] ([id number]) -------------------------------------------------------------------------------- Problem List Details Patient Name: [name], [name] Date of Service: 11/30/2021 11:00 AM Medical Record Number: [id number] Patient Account Number: [id number] Date of Birth/Sex: 02-Feb-1971 (51 y.o. F) Treating RN: Carlene Coria Primary Care Provider: Evern Bio Other Clinician: Referring Provider: Evern Bio Treating Provider/Extender: Jeri Cos Weeks in Treatment: 28 Active Problems ICD-10 Encounter Code Description Active Date MDM Diagnosis M86.371 Chronic multifocal osteomyelitis, right ankle and foot 05/18/2021 No Yes E11.621 Type 2 diabetes mellitus with foot ulcer 05/18/2021 No Yes L97.514 Non-pressure chronic ulcer of other part of right foot with necrosis of 05/18/2021 No Yes bone I50.42 Chronic combined systolic (congestive)  and diastolic (congestive) heart 05/18/2021 No Yes failure I10 Essential (primary) hypertension 05/18/2021 No Yes N18.6 End stage renal disease 05/18/2021 No Yes Z99.2 Dependence on renal dialysis 05/18/2021 No Yes Z99.81 Dependence on supplemental oxygen 05/18/2021 No Yes Inactive Problems Resolved Problems Electronic Signature(s) Signed: 11/30/2021 11:53:35 AM By: Worthy Keeler PA-C Entered By: Worthy Keeler on 11/30/2021 11:53:35 [name], [name] ([id number]) -------------------------------------------------------------------------------- Progress Note Details Patient Name: [name], [name] Date of Service: 11/30/2021 11:00 AM Medical Record Number: [id number] Patient Account Number: [id number] Date of Birth/Sex:  03/27/1970 (51 y.o. F) Treating RN: Carlene Coria Primary Care Provider: Evern Bio Other Clinician: Referring Provider: Evern Bio Treating Provider/Extender: Skipper Cliche in Treatment: 28 Subjective Chief Complaint Information obtained from Patient Right foot gangrene and osteomyelitis History of Present Illness (HPI) 10/04/2020 upon evaluation today patient presents for initial inspection here in the clinic concerning a fairly concerning wound over her right fifth toe towards the medial aspect which actually probes all the way down to bone. There is evidence of necrotic muscle as well and I do see tendon exposed as well. Subsequently this does have me very concerned about where things stand here. Again this is the first time that I am seeing her and on top of everything else her ABIs are noncompressible. I think that arterial flow is the primary concern here. Of note the patient states this was first noted July 1. She had a x-ray on July 4 which was negative for bone infection. She has been on 2 antibiotics she is unsure of the name. With that being said her most recent hemoglobin A1c was 8.1 that was December 2022. She otherwise does have a history obviously of diabetes mellitus type 2, hypertension, end-stage renal disease with dependence on renal dialysis, and she is also dependent on supplemental oxygen. Readmission: 05/14/2021 this is a patient whom I previously seen October 04, 2020. With that being said she is subsequently on 21 October 2020 ended up having an amputation of her fifth digit of her foot unfortunately. This had progressed fairly quickly into what sounds to been gangrene at that time. Subsequently right now based on what I am seeing the patient actually has a necrotic area on the end of her foot and has been recommended that the only option really here is going to be a amputation which would be a below-knee amputation. She is really not interested in that and  wants to try to give this every chance it can to heal. For that reason I discussed with her that definitely we can see what we can do to try to improve this situation and try to help get this doing better as best we can. With that being said there is definitely a portion of her wound that is just not good to be able to heal no matter what it is already starting to auto amputate. This includes potentially digits of her foot as well based on what I am seeing. She does have confirmed osteomyelitis noted by MRI. She is also currently on IV vancomycin. She does undergo dialysis on Tuesday, Thursday, and Saturday. The patient does have a history of diabetes mellitus type 2, congestive heart failure, hypertension, end-stage renal disease, dependence on renal dialysis, and dependence on supplemental oxygen. With that being said I am going to need to look into her heart failure and the significance of this as far as hyperbarics is concerned before we initiate treatment. She is probably also going to need an updated EKG  and chest x-ray unless its already been done recently. 05/28/2021 upon evaluation today patient appears to be doing well with regard to her wound all things considered. I am going to try to see what we can do about trimmed away some of the necrotic tissue that is really just hanging on here and there and not really helpful. Feel like if we can slowly start doing this maybe we will be able to get to the point where this is better and better as far as cleaning up the surface of the wound is concerned. Still as I explained I do believe some of her toes are not salvageable but again she is looking more towards autoamputation versus want to do anything definitive here from a surgical standpoint. I have been researching whether she would be an okay candidate for hyperbarics or not and I found that her x-ray but she had a chest x-ray recently was negative for any acute disease and appear to be stable. EKG  was also stable. She did also have a cardiac echo which showed that her ejection fraction was 60 to 65% and therefore I think she is a candidate for hyperbarics. Again I do believe that this can help with preventing amputation which right now she has been told that her only option would be a below-knee amputation. Nonetheless I think that if we can get the heel leading to spread and the majority of this cleared away that it is possible we might even be able to get a surgeon just to surgically remove the nonviable toes and not have to go the full way of complete amputation. Nonetheless we do have a ways to go before we will get to that point. Either way I think the hyperbarics could be an excellent support as far as that is concerned. 06-05-2021 upon evaluation today patient's wound is showing some signs of improvement. This does seem to be slow and of course it is understandable considering what we are seeing. She has discussed and looked up information about hyperbarics and in the end comes back today and states that she does want to proceed. I honestly do feel like that is her best chance at saving the foot. Even if we are able to get her into a surgeon after we obtain some good healing approximately so that there is not as much necrotic tissue in general she may even be able to have a transmetatarsal amputation and get this to heal. Again I am not saying this is can be an easy road but it may be her best road as far as thinking about how to achieve some type of complete healing as I do not believe the toes are viable at all to be honest. 07-06-2021 upon evaluation today patient appears to be doing well currently in regard to her foot all things considered. Again she unfortunately was unable to do the hyperbaric oxygen therapy this was due to her claustrophobia. Nonetheless she tells me that she is pleased with how the foot appears and she does feel like there is good improvement going on here. In  general I think that we are on the right track. If she is going to take a very long time and to be honest the route of autoamputation which is pretty much the way this is going is not a quick or easy road but nonetheless she seems to be taking care of her foot quite well. She is in good spirits. 07-27-2021 upon evaluation today patient appears to be  doing well currently in regard to her foot all things considered. A lot of the eschar is starting to lift up around the edges which is good there is still significant necrotic tissue on the distal portion of the foot. This is slowly going to work its way off. Based on what I am seeing currently I do not believe that there is any evidence of infection which is good news. 08-31-2021 upon evaluation today patient's wound actually appears to be doing decently well. I do not see anything in particular to try to trim away today. With that being said she continues to slowly allow this to do what is going to do as far as autoamputation is concerned. I did have another conversation with her today about the possibility of seeing a surgeon for a transmetatarsal amputation which again would help to get this to close [place] ([id number]) faster and obviously I think would be a much speedy year and safer way to proceed with amputation. With that being said she tells me that she would really prefer to just let this go at "God pace". 09-28-2021 upon evaluation today patient's foot actually appears to be doing much better. Fortunately I do not see any signs of infection which is great news and overall I am extremely pleased with where we stand today. There does not appear to be any signs of active infection locally or systemically which is great news. No fevers, chills, nausea, vomiting, or diarrhea. 10-26-2021 upon evaluation today patient appears to be doing well currently in regard to her foot all things considered. Fortunately I do not see any evidence of active  infection locally or systemically at this time which is great news. No fevers, chills, nausea, vomiting, or diarrhea. She does have some of the eschar that is lifting up even a little bit more compared to last time I saw her. 11-30-2021 upon evaluation today patient's wound is still continuing to have some areas that lift up although there is nothing really that I feel like I could trim away too much today to be honest. Fortunately I do not see any evidence of systemic infection though locally she has been concerned about some drainage I do not see anything that seems to be actively red or erythematous or warm to touch which is good news. No fevers, chills, nausea, vomiting, or diarrhea. Objective Constitutional Well-nourished and well-hydrated in no acute distress. Vitals Time Taken: 11:21 AM, Height: 64 in, Weight: 259 lbs, BMI: 44.5, Temperature: 97.9 F, Pulse: 105 bpm, Respiratory Rate: 18 breaths/min, Blood Pressure: 155/78 mmHg. Respiratory normal breathing without difficulty. Psychiatric this patient is able to make decisions and demonstrates good insight into disease process. Alert and Oriented x 3. pleasant and cooperative. General Notes: Upon inspection patient's wound bed actually showed signs of good granulation and epithelization at this point in some areas although the distal aspect of her foot is still necrotic and to be honest really needs to be removed but she is still adamantly opposed to any type of surgery. I had a discussion with her about that again today and she tells me that she does not want to see a Psychologist, sport and exercise. Integumentary (Hair, Skin) Wound #2 status is Open. Original cause of wound was Gradually Appeared. The date acquired was: 10/15/2020. The wound has been in treatment 28 weeks. The wound is located on the Right,Circumferential Foot. The wound measures 12cm length x 23cm width x 0.6cm depth; 216.77cm^2 area and 130.062cm^3 volume. There is Fat Layer (Subcutaneous  Tissue) exposed. There is no tunneling or undermining noted. There is a medium amount of serosanguineous drainage noted. There is medium (34-66%) granulation within the wound bed. There is a medium (34-66%) amount of necrotic tissue within the wound bed including Eschar and Adherent Slough. Assessment Active Problems ICD-10 Chronic multifocal osteomyelitis, right ankle and foot Type 2 diabetes mellitus with foot ulcer Non-pressure chronic ulcer of other part of right foot with necrosis of bone Chronic combined systolic (congestive) and diastolic (congestive) heart failure Essential (primary) hypertension End stage renal disease Dependence on renal dialysis Dependence on supplemental oxygen Plan Follow-up Appointments: [name], [name] ([id number]) Return Appointment in 3 weeks. Nurse Visit as needed Home Health: [facility] for wound care. May utilize formulary equivalent dressing for wound treatment orders unless otherwise specified. Home Health Nurse may visit PRN to address patient s wound care needs. Scheduled days for dressing changes to be completed; exception, patient has scheduled wound care visit that day. **Please direct any NON-WOUND related issues/requests for orders to patient's Primary Care Physician. **If current dressing causes regression in wound condition, may D/C ordered dressing product/s and apply Normal Saline Moist Dressing daily until next [place] or Other MD appointment. **Notify [facility] of regression in wound condition at [phone]. Bathing/ Shower/ Hygiene: Clean wound with Normal Saline or wound cleanser. Wash wounds with antibacterial soap and water. May shower; gently cleanse wound with antibacterial soap, rinse and pat dry prior to dressing wounds No tub bath. Off-Loading: Open toe surgical shoe - provided at visit Hyperbaric Oxygen Therapy: Evaluate for HBO Therapy Indication and  location: - right foot If appropriate for treatment, begin HBOT per protocol: 2.5 ATA for 90 Minutes with 2 Five (5) Minute Air Breaks One treatment per day (delivered Monday through Friday unless otherwise specified in Special Instructions below): Total # of Treatments: - 40 Finger stick Blood Glucose Pre- and Post- HBOT Treatment. Follow Hyperbaric Oxygen Glycemia Protocol The following medication(s) was prescribed: doxycycline hyclate oral 100 mg capsule 1 1 capsule oral taken 2 times per day for 30 days. Do not take at the same time as the Auryxia starting 11/30/2021 WOUND #2: - Foot Wound Laterality: Right, Circumferential Cleanser: Dakin 16 (oz) 0.25 1 x Per Day/30 Days Discharge Instructions: wet/dry in open areas of foot. Cleanser: Normal Saline 1 x Per Day/30 Days Discharge Instructions: Wash your hands with soap and water. Remove old dressing, discard into plastic bag and place into trash. Cleanse the wound with Normal Saline prior to applying a clean dressing using gauze sponges, not tissues or cotton balls. Do not scrub or use excessive force. Pat dry using gauze sponges, not tissue or cotton balls. Cleanser: Soap and Water 1 x Per Day/30 Days Discharge Instructions: Gently cleanse wound with antibacterial soap, rinse and pat dry prior to dressing wounds Topical: Betadine 1 x Per Day/30 Days Discharge Instructions: Apply betadine as directed. apply to gauze and pack sites and lay op open areas to top of foot Primary Dressing: Gauze 1 x Per Day/30 Days Discharge Instructions: As directed: betadine applied Secondary Dressing: ABD Pad 5x9 (in/in) 1 x Per Day/30 Days Discharge Instructions: Cover with ABD pad Secured With: Medipore Tape - 50M Medipore H Soft Cloth Surgical Tape, 2x2 (in/yd) 1 x Per Day/30 Days Secured With: Kerlix Roll Sterile or Non-Sterile 6-ply 4.5x4 (yd/yd) 1 x Per Day/30 Days Discharge Instructions: Apply Kerlix as directed Secured With: Stretch Net Dressing,  Latex-free, Size 5, Small-Head /  Shoulder / Thigh 1 x Per Day/30 Days 1. I still think that some of the eschar starting to loosen up the biggest issue is I do not want there to become any abscess pocket collected up underneath the eschar. I did tell her that if she develops any fevers, chills, nausea, vomiting, diarrhea, or mental confusion that she needs to go to the hospital for evaluation as soon as possible. This was discussed with her in the presence of her family member as well. 2. I am also can recommend at this time that we have the patient continue with the Betadine paints to the end of the foot with the gauze to cover. We are still using the Dakin's moistened gauze around the part of the foot where this is open currently. 3. She is going to continue as well with the roll gauze and stretch net to hold in place which still seems to be doing the best for her to be honest. 4. Based on what I am seeing I am going to go ahead and have the patient placed back on doxycycline I think this is still good to be the best way to go and she is in agreement with the plan. Is a good broad-spectrum antibiotic. We will see patient back for reevaluation in 3 weeks here in the clinic. If anything worsens or changes patient will contact our office for additional recommendations. Electronic Signature(s) Signed: 11/30/2021 1:37:53 PM By: Worthy Keeler PA-C Previous Signature: 11/30/2021 1:35:56 PM Version By: Worthy Keeler PA-C Previous Signature: 11/30/2021 1:35:17 PM Version By: Worthy Keeler PA-C Entered By: Worthy Keeler on 11/30/2021 13:37:53 [place], [name] ([id number]) -------------------------------------------------------------------------------- SuperBill Details Patient Name: Stender, [name] Date of Service: 11/30/2021 Medical Record Number: [id number] Patient Account Number: [id number] Date of Birth/Sex: 07-30-1970 (51 y.o. F) Treating RN: Carlene Coria Primary Care Provider: Evern Bio  Other Clinician: Referring Provider: Evern Bio Treating Provider/Extender: Jeri Cos Weeks in Treatment: 28 Diagnosis Coding ICD-10 Codes Code Description M86.371 Chronic multifocal osteomyelitis, right ankle and foot E11.621 Type 2 diabetes mellitus with foot ulcer L97.514 Non-pressure chronic ulcer of other part of right foot with necrosis of bone I50.42 Chronic combined systolic (congestive) and diastolic (congestive) heart failure I10 Essential (primary) hypertension N18.6 End stage renal disease Z99.2 Dependence on renal dialysis Z99.81 Dependence on supplemental oxygen Facility Procedures CPT4 Code: 49179150 Description: 99213 - WOUND CARE VISIT-LEV 3 EST PT Modifier: Quantity: 1 Physician Procedures CPT4 Code: 5697948 Description: 99214 - WC PHYS LEVEL 4 - EST PT Modifier: Quantity: 1 CPT4 Code: Description: ICD-10 Diagnosis Description M86.371 Chronic multifocal osteomyelitis, right ankle and foot E11.621 Type 2 diabetes mellitus with foot ulcer L97.514 Non-pressure chronic ulcer of other part of right foot with necrosis o I50.42 Chronic  combined systolic (congestive) and diastolic (congestive) hear Modifier: f bone t failure Quantity: Electronic Signature(s) Signed: 11/30/2021 1:38:09 PM By: Worthy Keeler PA-C Previous Signature: 11/30/2021 12:45:55 PM Version By: Carlene Coria RN Entered By: Worthy Keeler on 11/30/2021 13:38:09

## 2021-12-01 DIAGNOSIS — N186 End stage renal disease: Secondary | ICD-10-CM | POA: Diagnosis not present

## 2021-12-01 DIAGNOSIS — Z992 Dependence on renal dialysis: Secondary | ICD-10-CM | POA: Diagnosis not present

## 2021-12-01 DIAGNOSIS — E8779 Other fluid overload: Secondary | ICD-10-CM | POA: Diagnosis not present

## 2021-12-02 ENCOUNTER — Encounter: Payer: Self-pay | Admitting: Emergency Medicine

## 2021-12-02 ENCOUNTER — Other Ambulatory Visit: Payer: Self-pay

## 2021-12-02 ENCOUNTER — Emergency Department: Payer: Medicare Other

## 2021-12-02 ENCOUNTER — Emergency Department
Admission: EM | Admit: 2021-12-02 | Discharge: 2021-12-03 | Disposition: A | Discharge status: Home / Self Care | Payer: Medicare Other | Attending: Emergency Medicine | Admitting: Emergency Medicine

## 2021-12-02 DIAGNOSIS — R0789 Other chest pain: Secondary | ICD-10-CM | POA: Diagnosis not present

## 2021-12-02 DIAGNOSIS — Z992 Dependence on renal dialysis: Secondary | ICD-10-CM | POA: Insufficient documentation

## 2021-12-02 DIAGNOSIS — R079 Chest pain, unspecified: Secondary | ICD-10-CM | POA: Diagnosis not present

## 2021-12-02 DIAGNOSIS — Z20822 Contact with and (suspected) exposure to covid-19: Secondary | ICD-10-CM | POA: Diagnosis not present

## 2021-12-02 DIAGNOSIS — N186 End stage renal disease: Secondary | ICD-10-CM | POA: Insufficient documentation

## 2021-12-02 DIAGNOSIS — M542 Cervicalgia: Secondary | ICD-10-CM | POA: Diagnosis not present

## 2021-12-02 DIAGNOSIS — I700 Atherosclerosis of aorta: Secondary | ICD-10-CM | POA: Diagnosis not present

## 2021-12-02 DIAGNOSIS — I672 Cerebral atherosclerosis: Secondary | ICD-10-CM | POA: Diagnosis not present

## 2021-12-02 DIAGNOSIS — J9811 Atelectasis: Secondary | ICD-10-CM | POA: Diagnosis not present

## 2021-12-02 DIAGNOSIS — I120 Hypertensive chronic kidney disease with stage 5 chronic kidney disease or end stage renal disease: Secondary | ICD-10-CM | POA: Diagnosis not present

## 2021-12-02 DIAGNOSIS — M419 Scoliosis, unspecified: Secondary | ICD-10-CM | POA: Diagnosis not present

## 2021-12-02 DIAGNOSIS — K115 Sialolithiasis: Secondary | ICD-10-CM | POA: Diagnosis not present

## 2021-12-02 LAB — BASIC METABOLIC PANEL
Anion gap: 18 — ABNORMAL HIGH (ref 5–15)
BUN: 62 mg/dL — ABNORMAL HIGH (ref 6–20)
CO2: 23 mmol/L (ref 22–32)
Calcium: 8.9 mg/dL (ref 8.9–10.3)
Chloride: 92 mmol/L — ABNORMAL LOW (ref 98–111)
Creatinine, Ser: 8.95 mg/dL — ABNORMAL HIGH (ref 0.44–1.00)
GFR, Estimated: 5 mL/min — ABNORMAL LOW (ref >60)
Glucose, Bld: 124 mg/dL — ABNORMAL HIGH (ref 70–99)
Potassium: 4.1 mmol/L (ref 3.5–5.1)
Sodium: 133 mmol/L — ABNORMAL LOW (ref 135–145)

## 2021-12-02 LAB — CBC WITH DIFFERENTIAL/PLATELET
Abs Immature Granulocytes: 0.06 10*3/uL (ref 0.00–0.07)
Basophils Absolute: 0.1 10*3/uL (ref 0.0–0.1)
Basophils Relative: 0 %
Eosinophils Absolute: 0.4 10*3/uL (ref 0.0–0.5)
Eosinophils Relative: 3 %
HCT: 41.1 % (ref 36.0–46.0)
Hemoglobin: 12.8 g/dL (ref 12.0–15.0)
Immature Granulocytes: 1 %
Lymphocytes Relative: 16 %
Lymphs Abs: 1.8 10*3/uL (ref 0.7–4.0)
MCH: 29.2 pg (ref 26.0–34.0)
MCHC: 31.1 g/dL (ref 30.0–36.0)
MCV: 93.8 fL (ref 80.0–100.0)
Monocytes Absolute: 0.9 10*3/uL (ref 0.1–1.0)
Monocytes Relative: 8 %
Neutro Abs: 8 10*3/uL — ABNORMAL HIGH (ref 1.7–7.7)
Neutrophils Relative %: 72 %
Platelets: 308 10*3/uL (ref 150–400)
RBC: 4.38 MIL/uL (ref 3.87–5.11)
RDW: 12.7 % (ref 11.5–15.5)
WBC: 11.3 10*3/uL — ABNORMAL HIGH (ref 4.0–10.5)
nRBC: 0 % (ref 0.0–0.2)

## 2021-12-02 LAB — RESP PANEL BY RT-PCR (FLU A&B, COVID) ARPGX2
Influenza A by PCR: NEGATIVE
Influenza B by PCR: NEGATIVE
SARS Coronavirus 2 by RT PCR: NEGATIVE

## 2021-12-02 LAB — TROPONIN I (HIGH SENSITIVITY)
Troponin I (High Sensitivity): 46 ng/L — ABNORMAL HIGH (ref <18)
Troponin I (High Sensitivity): 41 ng/L — ABNORMAL HIGH (ref <18)

## 2021-12-02 MED ORDER — OXYCODONE-ACETAMINOPHEN 5-325 MG PO TABS
1.0000 | ORAL_TABLET | ORAL | Status: DC | PRN
  Administered 2021-12-02: 1 via ORAL
  Filled 2021-12-02: qty 1

## 2021-12-02 NOTE — ED Notes (Signed)
Oxygen tank changed 

## 2021-12-02 NOTE — ED Notes (Addendum)
Pt c/o L side neck and jaw and ear pain x 3 days, requesting pain medication.  Oxygen tank changed

## 2021-12-02 NOTE — ED Notes (Signed)
Pt asking about wait time, updated at this time. Pt reports pain meds were ineffective advised pt would need to see MD in a room for futher pain meds.

## 2021-12-02 NOTE — ED Triage Notes (Signed)
Pt to ED from home c/o left chest pain radiating to left neck for a couple days that is stabbing, sob.  Denies n/v/d.  Productive clear cough.  T/TH/Sat dialysis patient and went yesterday.  Pt has wound to right foot that she is currently being for with abx on Friday by podiatrist.  Pt A&Ox4, chest rise even and unlabored on chronic 4L [place], in NAD at this time.  PA in triage room for MSE.

## 2021-12-02 NOTE — ED Provider Triage Note (Signed)
Emergency Medicine Provider Triage Evaluation Note  [name] , a 51 y.o. female  was evaluated in triage.  Pt complains of chest pain that began two days ago. Reports that its left sided and center and goes up to her left neck.  On 4L chronic O2. Feels slightly more SOB than normal. No neck pain. No nausea or diaphoresis. Describes pain as stabbing. No fever. On HD, T/TH/Sat, completed yesterday.  Patient Active Problem List   Diagnosis Date Noted   SOB (shortness of breath)    History of osteomyelitis    RUQ pain    Status post thoracentesis    Acute hematogenous osteomyelitis of right foot ([place])    Pancreatitis 32/95/1884   Metabolic acidosis, increased anion gap 04/21/2021   GERD (gastroesophageal reflux disease) 04/21/2021   Acute on chronic diastolic CHF (congestive heart failure) ([place]) 11/14/2020   Anemia of chronic disease    Hyponatremia    Gangrene (HCC)    PVD (peripheral vascular disease) ([place])    Aspiration into airway    Hearing loss of right ear    Diabetic foot ulcer ([place]) 10/15/2020   Decreased pedal pulses 10/15/2020   Chronic respiratory failure with hypoxia ([place])    Extreme obesity with alveolar hypoventilation ([place])    Palliative care by specialist    Goals of care, counseling/discussion    ESRD (end stage renal disease) ([place])    Adjustment disorder with mixed anxiety and depressed mood 03/31/2017   Dysthymia 03/31/2017   Chronic pain syndrome 03/31/2017   Palliative care encounter    Obesity hypoventilation syndrome ([place]) 03/09/2017   Chronic diastolic heart failure ([place]) 01/07/2017   Depression 09/24/2016   Acute on chronic diastolic heart failure ([place]) 07/04/2016   COPD with chronic bronchitis 07/04/2016   Acute on chronic respiratory failure ([place]) 06/26/2016   CKD (chronic kidney disease), stage III ([place]) 06/26/2016   COPD exacerbation ([place]) 03/25/2016   Anxiety 10/02/2015   Asthma 08/30/2015   Type 2 diabetes mellitus with ESRD (end-stage  renal disease) ([place]) 03/13/2015   Iron deficiency anemia 02/17/2015   Obesity, Class III, BMI 40-49.9 (morbid obesity) ([place])    Shortness of breath    Essential hypertension    Hypertensive heart disease with congestive heart failure ([place]) 12/18/2014   Diabetes mellitus with ESRD (end-stage renal disease) ([place]) 12/15/2014     Review of Systems  Positive: Chest pain/jaw pain, sob Negative: Abd pain, fever, dizzy, back pain  Physical Exam  LMP 10/14/2012 (Approximate)  Gen:   Awake, no distress   Resp:  Normal effort, on O2 [place] MSK:   Moves extremities without difficulty  Other:    Medical Decision Making  Medically screening exam initiated at 5:44 PM.  Appropriate orders placed.  [name] was informed that the remainder of the evaluation will be completed by another provider, this initial triage assessment does not replace that evaluation, and the importance of remaining in the ED until their evaluation is complete.     Marquette Old, PA-C 12/02/21 1750

## 2021-12-03 ENCOUNTER — Emergency Department: Payer: Medicare Other

## 2021-12-03 DIAGNOSIS — D631 Anemia in chronic kidney disease: Secondary | ICD-10-CM | POA: Diagnosis not present

## 2021-12-03 DIAGNOSIS — I132 Hypertensive heart and chronic kidney disease with heart failure and with stage 5 chronic kidney disease, or end stage renal disease: Secondary | ICD-10-CM | POA: Diagnosis not present

## 2021-12-03 DIAGNOSIS — M419 Scoliosis, unspecified: Secondary | ICD-10-CM | POA: Diagnosis not present

## 2021-12-03 DIAGNOSIS — R0789 Other chest pain: Secondary | ICD-10-CM | POA: Diagnosis not present

## 2021-12-03 DIAGNOSIS — N186 End stage renal disease: Secondary | ICD-10-CM | POA: Diagnosis not present

## 2021-12-03 DIAGNOSIS — I700 Atherosclerosis of aorta: Secondary | ICD-10-CM | POA: Diagnosis not present

## 2021-12-03 DIAGNOSIS — I5032 Chronic diastolic (congestive) heart failure: Secondary | ICD-10-CM | POA: Diagnosis not present

## 2021-12-03 DIAGNOSIS — I672 Cerebral atherosclerosis: Secondary | ICD-10-CM | POA: Diagnosis not present

## 2021-12-03 DIAGNOSIS — K115 Sialolithiasis: Secondary | ICD-10-CM | POA: Diagnosis not present

## 2021-12-03 DIAGNOSIS — Z992 Dependence on renal dialysis: Secondary | ICD-10-CM | POA: Diagnosis not present

## 2021-12-03 DIAGNOSIS — L97513 Non-pressure chronic ulcer of other part of right foot with necrosis of muscle: Secondary | ICD-10-CM | POA: Diagnosis not present

## 2021-12-03 DIAGNOSIS — Z9981 Dependence on supplemental oxygen: Secondary | ICD-10-CM | POA: Diagnosis not present

## 2021-12-03 DIAGNOSIS — E11621 Type 2 diabetes mellitus with foot ulcer: Secondary | ICD-10-CM | POA: Diagnosis not present

## 2021-12-03 DIAGNOSIS — Z7952 Long term (current) use of systemic steroids: Secondary | ICD-10-CM | POA: Diagnosis not present

## 2021-12-03 DIAGNOSIS — E1151 Type 2 diabetes mellitus with diabetic peripheral angiopathy without gangrene: Secondary | ICD-10-CM | POA: Diagnosis not present

## 2021-12-03 DIAGNOSIS — E1122 Type 2 diabetes mellitus with diabetic chronic kidney disease: Secondary | ICD-10-CM | POA: Diagnosis not present

## 2021-12-03 DIAGNOSIS — Z794 Long term (current) use of insulin: Secondary | ICD-10-CM | POA: Diagnosis not present

## 2021-12-03 MED ORDER — OXYCODONE-ACETAMINOPHEN 5-325 MG PO TABS
2.0000 | ORAL_TABLET | Freq: Once | ORAL | Status: AC
  Administered 2021-12-03: 2 via ORAL
  Filled 2021-12-03: qty 2

## 2021-12-03 MED ORDER — OXYCODONE-ACETAMINOPHEN 5-325 MG PO TABS: 2.0000 | ORAL_TABLET | Freq: Four times a day (QID) | ORAL | 0 refills | Status: DC | PRN

## 2021-12-03 NOTE — Discharge Instructions (Addendum)
As we discussed, your evaluation was reassuring.  The cause of the pain in the left side of your neck is a stone in one of your submandibular salivary glands.  The treatment for this is actually to suck on a lot of sour candy, like lemon drops or lemon heads, as well as using warm compresses.  There is no need for antibiotics.  We also provided you with a prescription for pain medicine but hopefully this condition will improve soon.  We provided you with the name and phone number of an ENT specialist with whom you can follow-up if you are still having trouble.  Continue with your regular medications and going to dialysis as planned.    Return to the emergency department if you develop new or worsening symptoms that concern you.

## 2021-12-03 NOTE — ED Notes (Signed)
To CT

## 2021-12-03 NOTE — ED Provider Notes (Signed)
[facility] Provider Note    Event Date/Time   First MD Initiated Contact with Patient 12/02/21 2350     (approximate)   History   Chest Pain and Neck Pain   HPI  [name] is a 51 y.o. female with extensive chronic medical issues including end-stage renal disease on hemodialysis Tuesdays, Thursdays, and Saturdays.  She presents with a constellation of symptoms including generalized and nonspecific chest pain but mostly her concern is pain in the left side of her neck beneath her jaw that extends up into her head and down her neck.  She says she has never felt anything like it.  It started several days ago.  She has been trying to use warm compresses but does not seem to help.  Nothing in particular seems to make it better or worse.  She does not feel like she is having trouble swallowing or speaking or breathing, but she feels like if she pushes up underneath her jaw she can feel a lump and that it hurts more.  The pain radiates up into her left ear but her hearing is okay.  She has had no recent trauma.  She is not having trouble breathing and she has been compliant with her dialysis.     Physical Exam   Triage Vital Signs: ED Triage Vitals  Enc Vitals Group     BP 12/02/21 1749 (!) 133/94     Pulse Rate 12/02/21 1749 98     Resp 12/02/21 1749 18     Temp 12/02/21 1749 98.3 F (36.8 C)     Temp Source 12/02/21 1749 Oral     SpO2 12/02/21 1749 93 %     Weight 12/02/21 1751 119.3 kg (263 lb)     Height 12/02/21 1751 1.626 m ('5\' 4"'$ )     Head Circumference --      Peak Flow --      Pain Score 12/02/21 1751 8     Pain Loc --      Pain Edu? --      Excl. in [place]? --     Most recent vital signs: Vitals:   12/02/21 2348 12/03/21 0224  BP: (!) 149/79 119/76  Pulse: 91 92  Resp: 12 15  Temp:    SpO2: 96% 96%     General: Awake, no severe distress although she appears uncomfortable and certainly with a degree of chronic  illness. HEENT: Oropharynx is clear with no erythema, petechiae, nor exudate.  No angioedema.  No swelling of the tonsillar pillars or that region which would suggest a peritonsillar abscess.  I cannot palpate any deformity but the patient has submandibular tenderness on the left with no induration or bogginess.  The patient reports that the left side is quite tender to palpation, however.  She has no induration or swelling beneath the tongue to suggest Ludwig's angina.  No obvious dental abscesses. CV:  Good peripheral perfusion.  Regular rate and rhythm.  Palpable thrill at fistula site. Resp:  Normal effort.  Lungs are clear to auscultation bilaterally though somewhat limited auscultation due to body habitus. Abd:  Morbid obesity.  No distention.  No tenderness to palpation of the abdomen. Other:  No focal neurological deficits.   ED Results / Procedures / Treatments   Labs (all labs ordered are listed, but only abnormal results are displayed) Labs Reviewed  CBC WITH DIFFERENTIAL/PLATELET - Abnormal; Notable for the following components:      Result Value  WBC 11.3 (*)    Neutro Abs 8.0 (*)    All other components within normal limits  BASIC METABOLIC PANEL - Abnormal; Notable for the following components:   Sodium 133 (*)    Chloride 92 (*)    Glucose, Bld 124 (*)    BUN 62 (*)    Creatinine, Ser 8.95 (*)    GFR, Estimated 5 (*)    Anion gap 18 (*)    All other components within normal limits  TROPONIN I (HIGH SENSITIVITY) - Abnormal; Notable for the following components:   Troponin I (High Sensitivity) 46 (*)    All other components within normal limits  TROPONIN I (HIGH SENSITIVITY) - Abnormal; Notable for the following components:   Troponin I (High Sensitivity) 41 (*)    All other components within normal limits  RESP PANEL BY RT-PCR (FLU A&B, COVID) [id number]     EKG  ED ECG REPORT I, Hinda Kehr, the attending physician, personally viewed and interpreted this  ECG.  Date: 12/02/2021 EKG Time: 17:59 Rate: 99 Rhythm: normal sinus rhythm QRS Axis: normal Intervals: short PR interval ST/T Wave abnormalities: Non-specific ST segment / T-wave changes, but no clear evidence of acute ischemia. Narrative Interpretation: no definitive evidence of acute ischemia; does not meet STEMI criteria.    RADIOLOGY I viewed and interpreted the patient's two-view chest x-ray.  The exam is a little bit limited by her body habitus but in general it is reassuring.  Radiologist commented that there is at least a degree of interstitial edema which is not unexpected based on her dialysis status.  I also viewed and interpreted her CT soft tissue neck without contrast study.  I could not immediately appreciate any abnormalities but the radiologist commented that there is a 3 mm submandibular gland stone consistent with sialolithiasis but without evidence of acute sialoadenitis.    PROCEDURES:  Critical Care performed: No  Procedures   MEDICATIONS ORDERED IN ED: Medications  oxyCODONE-acetaminophen (PERCOCET/ROXICET) 5-325 MG per tablet 2 tablet (2 tablets Oral Given 12/03/21 0038)     IMPRESSION / MDM / ASSESSMENT AND PLAN / ED COURSE  I reviewed the triage vital signs and the nursing notes.                              Differential diagnosis includes, but is not limited to, complications of end-stage renal disease on dialysis, pulmonary edema, ACS, PE, deep neck space infection, Ludwig's angina, strep throat, viral infection such as COVID-19, otitis media.  [name] presentation is most consistent with acute presentation with potential threat to life or bodily function.  [name] vital signs are stable and within normal limits.  Labs/studies ordered: CBC with differential, high-sensitivity troponin x2, respiratory viral panel, basic metabolic panel, EKG, chest x-ray two-view.  I viewed and interpreted the patient's studies and lab results.  Her  high-sensitivity troponin is slightly elevated which is to be anticipated based on her dialysis status.  The second troponin was actually a little bit lower than the first of 41 which is reassuring.  Basic metabolic panel is essentially normal with no hyperkalemia and a creatinine of 8.95 on dialysis.  Her CBC with differential shows a very slight leukocytosis which is of unclear clinical significance.  The patient's main concern is her neck.  I ordered a CT soft tissue neck without contrast to evaluate for any obvious areas of induration or fluid collection which may represent an abscess.  The results as documented above came back notable for sialolithiasis, which fits clinically with her discomfort.  In triage they had ordered 1 Percocet and I ordered an additional 2 Percocet by the time I saw her which was multiple hours later.  I discussed with the patient and her husband the importance of using sour candies such as lemon drops to help flush out the salivary glands and I provided information about how to follow-up in ENT.  Prescription for Percocet as listed below.  The patient had a constellation of symptoms initially but the neck was her main issue and there is no evidence that she is having a PE, ACS, or other immediate or emergent medical conditions.  I considered hospitalization given the difficulty controlling her pain as well as her chronic comorbidities, but at this point the pain is tolerable and she is appropriate for outpatient management.  I gave my usual and customary return precautions.       FINAL CLINICAL IMPRESSION(S) / ED DIAGNOSES   Final diagnoses:  Sialolithiasis of submandibular gland  Atypical chest pain  ESRD on hemodialysis ([place])     Rx / DC Orders   ED Discharge Orders          Ordered    oxyCODONE-acetaminophen (PERCOCET) 5-325 MG tablet  Every 6 hours PRN        12/03/21 0206             Note:  This document was prepared using Dragon voice recognition  software and may include unintentional dictation errors.   Hinda Kehr, MD 12/03/21 1048

## 2021-12-04 ENCOUNTER — Emergency Department
Admission: EM | Admit: 2021-12-04 | Discharge: 2021-12-04 | Disposition: A | Discharge status: Home / Self Care | Payer: Medicare Other | Attending: Emergency Medicine | Admitting: Emergency Medicine

## 2021-12-04 ENCOUNTER — Encounter: Payer: Self-pay | Admitting: Medical Oncology

## 2021-12-04 DIAGNOSIS — R509 Fever, unspecified: Secondary | ICD-10-CM | POA: Diagnosis not present

## 2021-12-04 DIAGNOSIS — Z992 Dependence on renal dialysis: Secondary | ICD-10-CM | POA: Diagnosis not present

## 2021-12-04 DIAGNOSIS — N186 End stage renal disease: Secondary | ICD-10-CM | POA: Insufficient documentation

## 2021-12-04 DIAGNOSIS — M542 Cervicalgia: Secondary | ICD-10-CM | POA: Diagnosis not present

## 2021-12-04 DIAGNOSIS — K115 Sialolithiasis: Secondary | ICD-10-CM | POA: Insufficient documentation

## 2021-12-04 DIAGNOSIS — E8779 Other fluid overload: Secondary | ICD-10-CM | POA: Diagnosis not present

## 2021-12-04 MED ORDER — DICLOXACILLIN SODIUM 500 MG PO CAPS: 500.0000 mg | ORAL_CAPSULE | Freq: Four times a day (QID) | ORAL | 0 refills | Status: AC

## 2021-12-04 MED ORDER — KETOROLAC TROMETHAMINE 30 MG/ML IJ SOLN
30.0000 mg | Freq: Once | INTRAMUSCULAR | Status: AC
  Administered 2021-12-04: 30 mg via INTRAMUSCULAR
  Filled 2021-12-04: qty 1

## 2021-12-04 NOTE — ED Triage Notes (Signed)
Pt here with reports that she is having continued pain to left jaw/neck from a salivary gland stone. Pt reports that she was referred to ENT but cant be seen until Tuesday and needs pain relief.

## 2021-12-04 NOTE — ED Provider Notes (Signed)
[facility] Provider Note    Event Date/Time   First MD Initiated Contact with Patient 12/04/21 1042     (approximate)   History   Neck Pain   HPI  [name] is a 51 y.o. female   Past medical history of many medical comorbidities including end-stage renal disease on dialysis, chronic hypoxemic respiratory failure on 3 L nasal cannula at baseline, who presents today with left submandibular pain ongoing since her emergency department visit over this past weekend within the last 48 hours when she was diagnosed with a salivary stone gland in the submandibular gland on the left side without signs of infection.  She was advised for pain control and sour candies and ENT referral which she was unable to get an appointment for another 1 week, pain not well controlled with Percocets.  She has some subjective fevers, no other changes, no respiratory symptoms, maintaining secretions.   History was obtained via the patient and review of external medical notes including emergency department visit this weekend      Physical Exam   Triage Vital Signs: ED Triage Vitals  Enc Vitals Group     BP 12/04/21 1010 (!) 149/85     Pulse Rate 12/04/21 1010 95     Resp 12/04/21 1010 17     Temp 12/04/21 1010 98.6 F (37 C)     Temp Source 12/04/21 1010 Oral     SpO2 12/04/21 1010 96 %     Weight 12/04/21 1012 262 lb 5.6 oz (119 kg)     Height 12/04/21 1012 '5\' 4"'$  (1.626 m)     Head Circumference --      Peak Flow --      Pain Score 12/04/21 1010 9     Pain Loc --      Pain Edu? --      Excl. in [place]? --     Most recent vital signs: Vitals:   12/04/21 1010  BP: (!) 149/85  Pulse: 95  Resp: 17  Temp: 98.6 F (37 C)  SpO2: 96%    General: Awake, no distress.  CV:  Good peripheral perfusion.  Resp:  Normal effort.  Abd:  No distention.  Other:  No respiratory distress, maintaining secretions, phonation is normal, no obvious swelling on external exam, there  is some swelling and pain to palpation of the left submandibular area, normal appearing posterior oropharynx and normal-appearing TMs bilaterally no fever, hemodynamics appropriate and reassuring here in the emergency department.   ED Results / Procedures / Treatments   Labs (all labs ordered are listed, but only abnormal results are displayed) Labs Reviewed - No data to display  PROCEDURES:  Critical Care performed: No  Procedures   MEDICATIONS ORDERED IN ED: Medications  ketorolac (TORADOL) 30 MG/ML injection 30 mg (30 mg Intramuscular Given 12/04/21 1127)    IMPRESSION / MDM / [place] / ED COURSE  I reviewed the triage vital signs and the nursing notes.                              Differential diagnosis includes, but is not limited to, salivary gland obstruction, consider infection, does not appear clinically like airway obstruction or Ludwick's.   MDM: I gave several other ENT contact information for patient to see if she can get an earlier appointment and reemphasized the need for warm compresses, pain control, and sour candies (she  has been trying starbursts sweet candies) and also prescribed dicloxacillin in case of early infection given obstruction and subjective fevers though no evidence of infection on my clinical exam today.  Dispo: After careful consideration of this patient's presentation, medical and social risk factors, and evaluation in the emergency department I engaged in shared decision making with the patient and/or their representative to consider admission or observation and this patient was ultimately dc'd because medical evidence of airway obstruction or other emergent pathologies requiring hospitalization and plan for outpatient management as above.   [name] presentation is most consistent with acute presentation with potential threat to life or bodily function.       FINAL CLINICAL IMPRESSION(S) / ED DIAGNOSES   Final diagnoses:   Salivary gland stone  Neck pain     Rx / DC Orders   ED Discharge Orders          Ordered    dicloxacillin (DYNAPEN) 500 MG capsule  4 times daily        12/04/21 1120             Note:  This document was prepared using Dragon voice recognition software and may include unintentional dictation errors. Lucillie Garfinkel, MD 12/04/21 1130

## 2021-12-04 NOTE — Discharge Instructions (Addendum)
Call Dr. Marcelline Deist or Dr Janace Hoard for an appointment.  Or you can try [facility] at [phone] or [facility] at [phone]  Continue to use medications for pain as advised by your doctor in your previous visit, along with warm compresses and sour candies (lemon drops, lemon wedges, etc)   Take dicloxacillin as prescribed.   Thank you for choosing [place] for your health care today!  Please see your primary doctor this week for a follow up appointment.   If you do not have a primary doctor call the following clinics to establish care:  If you have insurance:  [facility] [phone] [place] [address]   [facility]  [phone] [address] [place] [address]   If you do not have insurance:  [facility]  [phone] [address], [place] [address]  Sometimes, in the early stages of certain disease courses it is difficult to detect in the emergency department evaluation -- so, it is important that you continue to monitor your symptoms and call your doctor right away or return to the emergency department if you develop any new or worsening symptoms.  It was my pleasure to care for you today.   Hoover Brunette Jacelyn Grip, MD

## 2021-12-05 ENCOUNTER — Telehealth: Payer: Self-pay | Admitting: Emergency Medicine

## 2021-12-05 DIAGNOSIS — E11621 Type 2 diabetes mellitus with foot ulcer: Secondary | ICD-10-CM | POA: Diagnosis not present

## 2021-12-05 DIAGNOSIS — Z9981 Dependence on supplemental oxygen: Secondary | ICD-10-CM | POA: Diagnosis not present

## 2021-12-05 DIAGNOSIS — I132 Hypertensive heart and chronic kidney disease with heart failure and with stage 5 chronic kidney disease, or end stage renal disease: Secondary | ICD-10-CM | POA: Diagnosis not present

## 2021-12-05 DIAGNOSIS — Z7952 Long term (current) use of systemic steroids: Secondary | ICD-10-CM | POA: Diagnosis not present

## 2021-12-05 DIAGNOSIS — Z794 Long term (current) use of insulin: Secondary | ICD-10-CM | POA: Diagnosis not present

## 2021-12-05 DIAGNOSIS — N186 End stage renal disease: Secondary | ICD-10-CM | POA: Diagnosis not present

## 2021-12-05 DIAGNOSIS — E1151 Type 2 diabetes mellitus with diabetic peripheral angiopathy without gangrene: Secondary | ICD-10-CM | POA: Diagnosis not present

## 2021-12-05 DIAGNOSIS — D631 Anemia in chronic kidney disease: Secondary | ICD-10-CM | POA: Diagnosis not present

## 2021-12-05 DIAGNOSIS — E8779 Other fluid overload: Secondary | ICD-10-CM | POA: Diagnosis not present

## 2021-12-05 DIAGNOSIS — L97513 Non-pressure chronic ulcer of other part of right foot with necrosis of muscle: Secondary | ICD-10-CM | POA: Diagnosis not present

## 2021-12-05 DIAGNOSIS — I5032 Chronic diastolic (congestive) heart failure: Secondary | ICD-10-CM | POA: Diagnosis not present

## 2021-12-05 DIAGNOSIS — E1122 Type 2 diabetes mellitus with diabetic chronic kidney disease: Secondary | ICD-10-CM | POA: Diagnosis not present

## 2021-12-05 DIAGNOSIS — Z992 Dependence on renal dialysis: Secondary | ICD-10-CM | POA: Diagnosis not present

## 2021-12-05 MED ORDER — OXYCODONE-ACETAMINOPHEN 5-325 MG PO TABS: 1.0000 | ORAL_TABLET | ORAL | 0 refills | Status: AC | PRN

## 2021-12-05 NOTE — Telephone Encounter (Signed)
Unable to get visit this week for ENT needs extension of pain meds, short script sent for percocet.

## 2021-12-06 DIAGNOSIS — Z992 Dependence on renal dialysis: Secondary | ICD-10-CM | POA: Diagnosis not present

## 2021-12-06 DIAGNOSIS — E8779 Other fluid overload: Secondary | ICD-10-CM | POA: Diagnosis not present

## 2021-12-06 DIAGNOSIS — N186 End stage renal disease: Secondary | ICD-10-CM | POA: Diagnosis not present

## 2021-12-07 DIAGNOSIS — E1122 Type 2 diabetes mellitus with diabetic chronic kidney disease: Secondary | ICD-10-CM | POA: Diagnosis not present

## 2021-12-07 DIAGNOSIS — Z794 Long term (current) use of insulin: Secondary | ICD-10-CM | POA: Diagnosis not present

## 2021-12-07 DIAGNOSIS — D631 Anemia in chronic kidney disease: Secondary | ICD-10-CM | POA: Diagnosis not present

## 2021-12-07 DIAGNOSIS — Z992 Dependence on renal dialysis: Secondary | ICD-10-CM | POA: Diagnosis not present

## 2021-12-07 DIAGNOSIS — E11621 Type 2 diabetes mellitus with foot ulcer: Secondary | ICD-10-CM | POA: Diagnosis not present

## 2021-12-07 DIAGNOSIS — I5032 Chronic diastolic (congestive) heart failure: Secondary | ICD-10-CM | POA: Diagnosis not present

## 2021-12-07 DIAGNOSIS — Z7952 Long term (current) use of systemic steroids: Secondary | ICD-10-CM | POA: Diagnosis not present

## 2021-12-07 DIAGNOSIS — N186 End stage renal disease: Secondary | ICD-10-CM | POA: Diagnosis not present

## 2021-12-07 DIAGNOSIS — Z9981 Dependence on supplemental oxygen: Secondary | ICD-10-CM | POA: Diagnosis not present

## 2021-12-07 DIAGNOSIS — I132 Hypertensive heart and chronic kidney disease with heart failure and with stage 5 chronic kidney disease, or end stage renal disease: Secondary | ICD-10-CM | POA: Diagnosis not present

## 2021-12-07 DIAGNOSIS — E1151 Type 2 diabetes mellitus with diabetic peripheral angiopathy without gangrene: Secondary | ICD-10-CM | POA: Diagnosis not present

## 2021-12-07 DIAGNOSIS — L97513 Non-pressure chronic ulcer of other part of right foot with necrosis of muscle: Secondary | ICD-10-CM | POA: Diagnosis not present

## 2021-12-07 DIAGNOSIS — J449 Chronic obstructive pulmonary disease, unspecified: Secondary | ICD-10-CM | POA: Diagnosis not present

## 2021-12-08 DIAGNOSIS — N186 End stage renal disease: Secondary | ICD-10-CM | POA: Diagnosis not present

## 2021-12-08 DIAGNOSIS — Z992 Dependence on renal dialysis: Secondary | ICD-10-CM | POA: Diagnosis not present

## 2021-12-08 DIAGNOSIS — E8779 Other fluid overload: Secondary | ICD-10-CM | POA: Diagnosis not present

## 2021-12-10 DIAGNOSIS — N186 End stage renal disease: Secondary | ICD-10-CM | POA: Diagnosis not present

## 2021-12-10 DIAGNOSIS — E1151 Type 2 diabetes mellitus with diabetic peripheral angiopathy without gangrene: Secondary | ICD-10-CM | POA: Diagnosis not present

## 2021-12-10 DIAGNOSIS — I5032 Chronic diastolic (congestive) heart failure: Secondary | ICD-10-CM | POA: Diagnosis not present

## 2021-12-10 DIAGNOSIS — E1122 Type 2 diabetes mellitus with diabetic chronic kidney disease: Secondary | ICD-10-CM | POA: Diagnosis not present

## 2021-12-10 DIAGNOSIS — Z7952 Long term (current) use of systemic steroids: Secondary | ICD-10-CM | POA: Diagnosis not present

## 2021-12-10 DIAGNOSIS — Z794 Long term (current) use of insulin: Secondary | ICD-10-CM | POA: Diagnosis not present

## 2021-12-10 DIAGNOSIS — L97513 Non-pressure chronic ulcer of other part of right foot with necrosis of muscle: Secondary | ICD-10-CM | POA: Diagnosis not present

## 2021-12-10 DIAGNOSIS — E11621 Type 2 diabetes mellitus with foot ulcer: Secondary | ICD-10-CM | POA: Diagnosis not present

## 2021-12-10 DIAGNOSIS — Z9981 Dependence on supplemental oxygen: Secondary | ICD-10-CM | POA: Diagnosis not present

## 2021-12-10 DIAGNOSIS — D631 Anemia in chronic kidney disease: Secondary | ICD-10-CM | POA: Diagnosis not present

## 2021-12-10 DIAGNOSIS — Z992 Dependence on renal dialysis: Secondary | ICD-10-CM | POA: Diagnosis not present

## 2021-12-10 DIAGNOSIS — I132 Hypertensive heart and chronic kidney disease with heart failure and with stage 5 chronic kidney disease, or end stage renal disease: Secondary | ICD-10-CM | POA: Diagnosis not present

## 2021-12-11 DIAGNOSIS — Z992 Dependence on renal dialysis: Secondary | ICD-10-CM | POA: Diagnosis not present

## 2021-12-11 DIAGNOSIS — K1121 Acute sialoadenitis: Secondary | ICD-10-CM | POA: Diagnosis not present

## 2021-12-11 DIAGNOSIS — N186 End stage renal disease: Secondary | ICD-10-CM | POA: Diagnosis not present

## 2021-12-11 DIAGNOSIS — E119 Type 2 diabetes mellitus without complications: Secondary | ICD-10-CM | POA: Diagnosis not present

## 2021-12-11 DIAGNOSIS — K115 Sialolithiasis: Secondary | ICD-10-CM | POA: Diagnosis not present

## 2021-12-11 DIAGNOSIS — E8779 Other fluid overload: Secondary | ICD-10-CM | POA: Diagnosis not present

## 2021-12-12 DIAGNOSIS — Z992 Dependence on renal dialysis: Secondary | ICD-10-CM | POA: Diagnosis not present

## 2021-12-12 DIAGNOSIS — Z794 Long term (current) use of insulin: Secondary | ICD-10-CM | POA: Diagnosis not present

## 2021-12-12 DIAGNOSIS — E11621 Type 2 diabetes mellitus with foot ulcer: Secondary | ICD-10-CM | POA: Diagnosis not present

## 2021-12-12 DIAGNOSIS — N186 End stage renal disease: Secondary | ICD-10-CM | POA: Diagnosis not present

## 2021-12-12 DIAGNOSIS — E1122 Type 2 diabetes mellitus with diabetic chronic kidney disease: Secondary | ICD-10-CM | POA: Diagnosis not present

## 2021-12-12 DIAGNOSIS — I5032 Chronic diastolic (congestive) heart failure: Secondary | ICD-10-CM | POA: Diagnosis not present

## 2021-12-12 DIAGNOSIS — E1151 Type 2 diabetes mellitus with diabetic peripheral angiopathy without gangrene: Secondary | ICD-10-CM | POA: Diagnosis not present

## 2021-12-12 DIAGNOSIS — L97513 Non-pressure chronic ulcer of other part of right foot with necrosis of muscle: Secondary | ICD-10-CM | POA: Diagnosis not present

## 2021-12-12 DIAGNOSIS — Z9981 Dependence on supplemental oxygen: Secondary | ICD-10-CM | POA: Diagnosis not present

## 2021-12-12 DIAGNOSIS — D631 Anemia in chronic kidney disease: Secondary | ICD-10-CM | POA: Diagnosis not present

## 2021-12-12 DIAGNOSIS — Z7952 Long term (current) use of systemic steroids: Secondary | ICD-10-CM | POA: Diagnosis not present

## 2021-12-12 DIAGNOSIS — I132 Hypertensive heart and chronic kidney disease with heart failure and with stage 5 chronic kidney disease, or end stage renal disease: Secondary | ICD-10-CM | POA: Diagnosis not present

## 2021-12-13 DIAGNOSIS — N186 End stage renal disease: Secondary | ICD-10-CM | POA: Diagnosis not present

## 2021-12-13 DIAGNOSIS — Z992 Dependence on renal dialysis: Secondary | ICD-10-CM | POA: Diagnosis not present

## 2021-12-13 DIAGNOSIS — E8779 Other fluid overload: Secondary | ICD-10-CM | POA: Diagnosis not present

## 2021-12-14 DIAGNOSIS — L97513 Non-pressure chronic ulcer of other part of right foot with necrosis of muscle: Secondary | ICD-10-CM | POA: Diagnosis not present

## 2021-12-14 DIAGNOSIS — E11621 Type 2 diabetes mellitus with foot ulcer: Secondary | ICD-10-CM | POA: Diagnosis not present

## 2021-12-14 DIAGNOSIS — Z9981 Dependence on supplemental oxygen: Secondary | ICD-10-CM | POA: Diagnosis not present

## 2021-12-14 DIAGNOSIS — I132 Hypertensive heart and chronic kidney disease with heart failure and with stage 5 chronic kidney disease, or end stage renal disease: Secondary | ICD-10-CM | POA: Diagnosis not present

## 2021-12-14 DIAGNOSIS — D631 Anemia in chronic kidney disease: Secondary | ICD-10-CM | POA: Diagnosis not present

## 2021-12-14 DIAGNOSIS — Z992 Dependence on renal dialysis: Secondary | ICD-10-CM | POA: Diagnosis not present

## 2021-12-14 DIAGNOSIS — I5032 Chronic diastolic (congestive) heart failure: Secondary | ICD-10-CM | POA: Diagnosis not present

## 2021-12-14 DIAGNOSIS — Z794 Long term (current) use of insulin: Secondary | ICD-10-CM | POA: Diagnosis not present

## 2021-12-14 DIAGNOSIS — Z7952 Long term (current) use of systemic steroids: Secondary | ICD-10-CM | POA: Diagnosis not present

## 2021-12-14 DIAGNOSIS — E1151 Type 2 diabetes mellitus with diabetic peripheral angiopathy without gangrene: Secondary | ICD-10-CM | POA: Diagnosis not present

## 2021-12-14 DIAGNOSIS — E1122 Type 2 diabetes mellitus with diabetic chronic kidney disease: Secondary | ICD-10-CM | POA: Diagnosis not present

## 2021-12-14 DIAGNOSIS — N186 End stage renal disease: Secondary | ICD-10-CM | POA: Diagnosis not present

## 2021-12-15 DIAGNOSIS — E8779 Other fluid overload: Secondary | ICD-10-CM | POA: Diagnosis not present

## 2021-12-15 DIAGNOSIS — N186 End stage renal disease: Secondary | ICD-10-CM | POA: Diagnosis not present

## 2021-12-15 DIAGNOSIS — Z992 Dependence on renal dialysis: Secondary | ICD-10-CM | POA: Diagnosis not present

## 2021-12-17 DIAGNOSIS — I5032 Chronic diastolic (congestive) heart failure: Secondary | ICD-10-CM | POA: Diagnosis not present

## 2021-12-17 DIAGNOSIS — E1151 Type 2 diabetes mellitus with diabetic peripheral angiopathy without gangrene: Secondary | ICD-10-CM | POA: Diagnosis not present

## 2021-12-17 DIAGNOSIS — N186 End stage renal disease: Secondary | ICD-10-CM | POA: Diagnosis not present

## 2021-12-17 DIAGNOSIS — E11621 Type 2 diabetes mellitus with foot ulcer: Secondary | ICD-10-CM | POA: Diagnosis not present

## 2021-12-17 DIAGNOSIS — D631 Anemia in chronic kidney disease: Secondary | ICD-10-CM | POA: Diagnosis not present

## 2021-12-17 DIAGNOSIS — Z9981 Dependence on supplemental oxygen: Secondary | ICD-10-CM | POA: Diagnosis not present

## 2021-12-17 DIAGNOSIS — Z7952 Long term (current) use of systemic steroids: Secondary | ICD-10-CM | POA: Diagnosis not present

## 2021-12-17 DIAGNOSIS — I132 Hypertensive heart and chronic kidney disease with heart failure and with stage 5 chronic kidney disease, or end stage renal disease: Secondary | ICD-10-CM | POA: Diagnosis not present

## 2021-12-17 DIAGNOSIS — E1122 Type 2 diabetes mellitus with diabetic chronic kidney disease: Secondary | ICD-10-CM | POA: Diagnosis not present

## 2021-12-17 DIAGNOSIS — L97513 Non-pressure chronic ulcer of other part of right foot with necrosis of muscle: Secondary | ICD-10-CM | POA: Diagnosis not present

## 2021-12-17 DIAGNOSIS — Z794 Long term (current) use of insulin: Secondary | ICD-10-CM | POA: Diagnosis not present

## 2021-12-17 DIAGNOSIS — Z992 Dependence on renal dialysis: Secondary | ICD-10-CM | POA: Diagnosis not present

## 2021-12-17 NOTE — Telephone Encounter (Signed)
[name] from [employer] needs authorization for BIPAP tubing and mask. States patient not able to use BIPAP machine due to mask. [place] phone number is [phone].

## 2021-12-17 NOTE — Telephone Encounter (Signed)
Anita, can you help with this? 

## 2021-12-18 DIAGNOSIS — J9611 Chronic respiratory failure with hypoxia: Secondary | ICD-10-CM | POA: Diagnosis not present

## 2021-12-18 DIAGNOSIS — J449 Chronic obstructive pulmonary disease, unspecified: Secondary | ICD-10-CM | POA: Diagnosis not present

## 2021-12-18 DIAGNOSIS — E8779 Other fluid overload: Secondary | ICD-10-CM | POA: Diagnosis not present

## 2021-12-18 DIAGNOSIS — N186 End stage renal disease: Secondary | ICD-10-CM | POA: Diagnosis not present

## 2021-12-18 DIAGNOSIS — Z992 Dependence on renal dialysis: Secondary | ICD-10-CM | POA: Diagnosis not present

## 2021-12-18 DIAGNOSIS — I5032 Chronic diastolic (congestive) heart failure: Secondary | ICD-10-CM | POA: Diagnosis not present

## 2021-12-19 DIAGNOSIS — L97513 Non-pressure chronic ulcer of other part of right foot with necrosis of muscle: Secondary | ICD-10-CM | POA: Diagnosis not present

## 2021-12-19 DIAGNOSIS — Z794 Long term (current) use of insulin: Secondary | ICD-10-CM | POA: Diagnosis not present

## 2021-12-19 DIAGNOSIS — I5032 Chronic diastolic (congestive) heart failure: Secondary | ICD-10-CM | POA: Diagnosis not present

## 2021-12-19 DIAGNOSIS — Z992 Dependence on renal dialysis: Secondary | ICD-10-CM | POA: Diagnosis not present

## 2021-12-19 DIAGNOSIS — E11621 Type 2 diabetes mellitus with foot ulcer: Secondary | ICD-10-CM | POA: Diagnosis not present

## 2021-12-19 DIAGNOSIS — K1121 Acute sialoadenitis: Secondary | ICD-10-CM | POA: Diagnosis not present

## 2021-12-19 DIAGNOSIS — Z9981 Dependence on supplemental oxygen: Secondary | ICD-10-CM | POA: Diagnosis not present

## 2021-12-19 DIAGNOSIS — Z7952 Long term (current) use of systemic steroids: Secondary | ICD-10-CM | POA: Diagnosis not present

## 2021-12-19 DIAGNOSIS — E1151 Type 2 diabetes mellitus with diabetic peripheral angiopathy without gangrene: Secondary | ICD-10-CM | POA: Diagnosis not present

## 2021-12-19 DIAGNOSIS — D631 Anemia in chronic kidney disease: Secondary | ICD-10-CM | POA: Diagnosis not present

## 2021-12-19 DIAGNOSIS — E8779 Other fluid overload: Secondary | ICD-10-CM | POA: Diagnosis not present

## 2021-12-19 DIAGNOSIS — E1122 Type 2 diabetes mellitus with diabetic chronic kidney disease: Secondary | ICD-10-CM | POA: Diagnosis not present

## 2021-12-19 DIAGNOSIS — N186 End stage renal disease: Secondary | ICD-10-CM | POA: Diagnosis not present

## 2021-12-19 DIAGNOSIS — I132 Hypertensive heart and chronic kidney disease with heart failure and with stage 5 chronic kidney disease, or end stage renal disease: Secondary | ICD-10-CM | POA: Diagnosis not present

## 2021-12-20 DIAGNOSIS — N186 End stage renal disease: Secondary | ICD-10-CM | POA: Diagnosis not present

## 2021-12-20 DIAGNOSIS — Z992 Dependence on renal dialysis: Secondary | ICD-10-CM | POA: Diagnosis not present

## 2021-12-20 DIAGNOSIS — E8779 Other fluid overload: Secondary | ICD-10-CM | POA: Diagnosis not present

## 2021-12-21 ENCOUNTER — Encounter: Payer: Medicare Other | Admitting: Physician Assistant

## 2021-12-21 DIAGNOSIS — E11621 Type 2 diabetes mellitus with foot ulcer: Secondary | ICD-10-CM | POA: Diagnosis not present

## 2021-12-21 DIAGNOSIS — E1151 Type 2 diabetes mellitus with diabetic peripheral angiopathy without gangrene: Secondary | ICD-10-CM | POA: Diagnosis not present

## 2021-12-21 DIAGNOSIS — Z9981 Dependence on supplemental oxygen: Secondary | ICD-10-CM | POA: Diagnosis not present

## 2021-12-21 DIAGNOSIS — Z992 Dependence on renal dialysis: Secondary | ICD-10-CM | POA: Diagnosis not present

## 2021-12-21 DIAGNOSIS — I5042 Chronic combined systolic (congestive) and diastolic (congestive) heart failure: Secondary | ICD-10-CM | POA: Diagnosis not present

## 2021-12-21 DIAGNOSIS — L97514 Non-pressure chronic ulcer of other part of right foot with necrosis of bone: Secondary | ICD-10-CM | POA: Diagnosis not present

## 2021-12-21 DIAGNOSIS — M86371 Chronic multifocal osteomyelitis, right ankle and foot: Secondary | ICD-10-CM | POA: Diagnosis not present

## 2021-12-21 DIAGNOSIS — N186 End stage renal disease: Secondary | ICD-10-CM | POA: Diagnosis not present

## 2021-12-21 DIAGNOSIS — I132 Hypertensive heart and chronic kidney disease with heart failure and with stage 5 chronic kidney disease, or end stage renal disease: Secondary | ICD-10-CM | POA: Diagnosis not present

## 2021-12-21 DIAGNOSIS — E1122 Type 2 diabetes mellitus with diabetic chronic kidney disease: Secondary | ICD-10-CM | POA: Diagnosis not present

## 2021-12-21 DIAGNOSIS — M1990 Unspecified osteoarthritis, unspecified site: Secondary | ICD-10-CM | POA: Diagnosis not present

## 2021-12-21 DIAGNOSIS — E1140 Type 2 diabetes mellitus with diabetic neuropathy, unspecified: Secondary | ICD-10-CM | POA: Diagnosis not present

## 2021-12-21 NOTE — Progress Notes (Signed)
Paro, [name] ([id number]) [id number] Page 1 of 2 Visit Report for 12/21/2021 Chief Complaint Document Details Patient Name: Date of Service: [name], [name] 12/21/2021 10:30 A M Medical Record Number: [id number] Patient Account Number: [id number] Date of Birth/Sex: Treating RN: 07-11-1970 (51 y.o. [name] Primary Care Provider: Evern Bio Other Clinician: Massie Kluver Referring Provider: Treating Provider/Extender: Darien Ramus Weeks in Treatment: 31 Information Obtained from: Patient Chief Complaint Right foot gangrene and osteomyelitis Electronic Signature(s) Signed: 12/21/2021 10:24:47 AM By: Worthy Keeler PA-C Entered By: Worthy Keeler on 12/21/2021 10:24:46 -------------------------------------------------------------------------------- Problem List Details Patient Name: Date of Service: [name], [name] 12/21/2021 10:30 A M Medical Record Number: [id number] Patient Account Number: [id number] Date of Birth/Sex: Treating RN: 11/29/1970 (51 y.o. 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

## 2021-12-21 NOTE — Progress Notes (Addendum)
[name], [name] ([id number]) [id number] Page 1 of 9 Visit Report for 12/21/2021 Arrival Information Details Patient Name: Date of Service: [place], [place] RLA M. 12/21/2021 10:30 A M Medical Record Number: [id number] Patient Account Number: [id number] Date of Birth/Sex: Treating RN: [birth year] (51 y.o. [name] Primary Care Daysia Vandenboom: Evern Bio Other Clinician: Massie Kluver Referring Georgena Weisheit: Treating Donicia Druck/Extender: Darien Ramus Weeks in Treatment: 65 Visit Information History Since Last Visit All ordered tests and consults were completed: No Patient Arrived: Wheel Chair Added or deleted any medications: No Arrival Time: 10:27 Any new allergies or adverse reactions: No Transfer Assistance: EasyPivot Patient Lift Had a fall or experienced change in No Patient Requires Transmission-Based Precautions: No activities of daily living that may affect Patient Has Alerts: No risk of falls: Hospitalized since last visit: No Pain Present Now: Yes Electronic Signature(s) Signed: 12/24/2021 5:32:10 PM By: Massie Kluver Entered By: Massie Kluver on 12/21/2021 10:28:14 -------------------------------------------------------------------------------- Clinic Level of Care Assessment Details Patient Name: Date of Service: [name], [name] RLA M. 12/21/2021 10:30 A M Medical Record Number: [id number] Patient Account Number: [id number] Date of Birth/Sex: Treating RN: 1970/07/10 (51 y.o. [name] Primary Care Ambry Dix: Evern Bio Other Clinician: Massie Kluver Referring Richardo Popoff: Treating Idalia Allbritton/Extender: Darien Ramus Weeks in Treatment: 31 Clinic Level of Care Assessment Items TOOL 1 Quantity Score '[]'$  - 0 Use when EandM and Procedure is performed on INITIAL visit ASSESSMENTS - Nursing Assessment / Reassessment '[]'$  - 0 General Physical Exam (combine w/ comprehensive assessment (listed just below) when performed on  new pt. evals) '[]'$  - 0 Comprehensive Assessment (HX, ROS, Risk Assessments, Wounds Hx, etc.) ASSESSMENTS - Wound and Skin Assessment / Reassessment '[]'$  - 0 Dermatologic / Skin Assessment (not related to wound area) ASSESSMENTS - Ostomy and/or Continence Assessment and Care Trine, [name] ([id number]) [id number] Page 2 of 9 '[]'$  - 0 Incontinence Assessment and Management '[]'$  - 0 Ostomy Care Assessment and Management (repouching, etc.) PROCESS - Coordination of Care '[]'$  - 0 Simple Patient / Family Education for ongoing care '[]'$  - 0 Complex (extensive) Patient / Family Education for ongoing care '[]'$  - 0 Staff obtains Programmer, systems, Records, T Results / Process Orders est '[]'$  - 0 Staff telephones HHA, Nursing Homes / Clarify orders / etc '[]'$  - 0 Routine Transfer to another Facility (non-emergent condition) '[]'$  - 0 Routine Hospital Admission (non-emergent condition) '[]'$  - 0 New Admissions / Biomedical engineer / Ordering NPWT Apligraf, etc. , '[]'$  - 0 Emergency Hospital Admission (emergent condition) PROCESS - Special Needs '[]'$  - 0 Pediatric / Minor Patient Management '[]'$  - 0 Isolation Patient Management '[]'$  - 0 Hearing / Language / Visual special needs '[]'$  - 0 Assessment of Community assistance (transportation, D/C planning, etc.) '[]'$  - 0 Additional assistance / Altered mentation '[]'$  - 0 Support Surface(s) Assessment (bed, cushion, seat, etc.) INTERVENTIONS - Miscellaneous '[]'$  - 0 External ear exam '[]'$  - 0 Patient Transfer (multiple staff / Civil Service fast streamer / Similar devices) '[]'$  - 0 Simple Staple / Suture removal (25 or less) '[]'$  - 0 Complex Staple / Suture removal (26 or more) '[]'$  - 0 Hypo/Hyperglycemic Management (do not check if billed separately) '[]'$  - 0 Ankle / Brachial Index (ABI) - do not check if billed separately Has the patient been seen at the hospital within the last three years: Yes Total Score: 0 Level Of Care: ____ Electronic Signature(s) Signed:  12/24/2021 5:32:10 PM By: Massie Kluver Entered By: Massie Kluver on 12/21/2021 11:17:00 -------------------------------------------------------------------------------- Encounter Discharge Information Details Patient  Name: Date of Service: Brosch, [name] RLA M. 12/21/2021 10:30 A M Medical Record Number: [id number] Patient Account Number: [id number] Date of Birth/Sex: Treating RN: [birth year] (51 y.o. [name] Primary Care Azarel Banner: Evern Bio Other Clinician: Massie Kluver Referring [name]: Treating Mette Southgate/Extender: Darien Ramus Weeks in Treatment: 51 Encounter Discharge Information Items Post Procedure Vitals Discharge Condition: Stable Temperature (F): 98.3 Ha, Taejah M ([id number]) [id number] Page 3 of 9 Ambulatory Status: Wheelchair Pulse (bpm): 99 Discharge Destination: Home Respiratory Rate (breaths/min): 18 Transportation: Private Auto Blood Pressure (mmHg): 120/77 Accompanied By: husband Schedule Follow-up Appointment: Yes Clinical Summary of Care: Electronic Signature(s) Signed: 12/24/2021 5:32:10 PM By: Massie Kluver Entered By: Massie Kluver on 12/21/2021 13:09:33 -------------------------------------------------------------------------------- Lower Extremity Assessment Details Patient Name: Date of Service: Viney, [name] RLA M. 12/21/2021 10:30 A M Medical Record Number: [id number] Patient Account Number: [id number] Date of Birth/Sex: Treating RN: 1970-08-30 (51 y.o. [name] Primary Care [name]: Evern Bio Other Clinician: Massie Kluver Referring [name]: Treating Meer Reindl/Extender: Darien Ramus Weeks in Treatment: 31 Electronic Signature(s) Signed: 12/21/2021 11:05:28 AM By: Gretta Cool BSN, RN, CWS, Kim RN, BSN Signed: 12/24/2021 5:32:10 PM By: Massie Kluver Entered By: Massie Kluver on 12/21/2021  10:40:22 -------------------------------------------------------------------------------- Multi Wound Chart Details Patient Name: Date of Service: [name], [name] RLA M. 12/21/2021 10:30 A M Medical Record Number: [id number] Patient Account Number: [id number] Date of Birth/Sex: Treating RN: 10/16/1970 (51 y.o. [name] Primary Care Arthea Nobel: Evern Bio Other Clinician: Massie Kluver Referring Numair Masden: Treating Palmyra Rogacki/Extender: Darien Ramus Weeks in Treatment: 31 Vital Signs Height(in): Pulse(bpm): 99 Weight(lbs): Blood Pressure(mmHg): 120/77 Body Mass Index(BMI): Temperature(F): 98.3 Respiratory Rate(breaths/min): 18 [2:Photos:] [N/A:N/A] Right, Circumferential Foot N/A N/A Wound Location: Gradually Appeared N/A N/A Wounding Event: Diabetic Wound/Ulcer of the Lower N/A N/A Primary Etiology: Extremity Cataracts, Anemia, Asthma, Chronic N/A N/A Comorbid History: Obstructive Pulmonary Disease (COPD), Congestive Heart Failure, Hypertension, Type II Diabetes, End Stage Renal Disease, Osteoarthritis, Neuropathy 10/15/2020 N/A N/A Date Acquired: 31 N/A N/A Weeks of Treatment: Open N/A N/A Wound Status: No N/A N/A Wound Recurrence: Yes N/A N/A Pending A mputation on Presentation: 10x6.8x0.4 N/A N/A Measurements L x W x D (cm) 53.407 N/A N/A A (cm) : rea 21.363 N/A N/A Volume (cm) : 49.60% N/A N/A % Reduction in A rea: 89.90% N/A N/A % Reduction in Volume: Grade 4 N/A N/A Classification: Medium N/A N/A Exudate A mount: Serosanguineous N/A N/A Exudate Type: red, brown N/A N/A Exudate Color: Medium (34-66%) N/A N/A Granulation A mount: Medium (34-66%) N/A N/A Necrotic A mount: Eschar, Adherent Slough N/A N/A Necrotic Tissue: Fat Layer (Subcutaneous Tissue): Yes N/A N/A Exposed Structures: Fascia: No Tendon: No Muscle: No Joint: No Bone: No Treatment Notes Electronic Signature(s) Signed: 12/24/2021 5:32:10 PM By: Massie Kluver Entered By: Massie Kluver on 12/21/2021 10:40:35 -------------------------------------------------------------------------------- Kulm Details Patient Name: Date of Service: [name], [name] RLA M. 12/21/2021 10:30 A M Medical Record Number: [id number] Patient Account Number: [id number] Date of Birth/Sex: Treating RN: [birth year] (51 y.o. [name] Primary Care Braxton Weisbecker: Evern Bio Other Clinician: Massie Kluver Referring Hadassa Cermak: Treating Rosemarie Galvis/Extender: Darien Ramus Weeks in Treatment: [address] [place] M ([id number]) [id number] Page 5 of 9 Active Inactive HBO Nursing Diagnoses: Anxiety related to feelings of confinement associated with the hyperbaric oxygen chamber Anxiety related to knowledge deficit of hyperbaric oxygen therapy and treatment procedures Discomfort related to temperature and humidity changes inside hyperbaric chamber Potential for barotraumas to ears, sinuses, teeth, and lungs or cerebral gas embolism  related to changes in atmospheric pressure inside hyperbaric oxygen chamber Potential for oxygen toxicity seizures related to delivery of 100% oxygen at an increased atmospheric pressure Potential for pulmonary oxygen toxicity related to delivery of 100% oxygen at an increased atmospheric pressure Goals: Barotrauma will be prevented during HBO2 Date Initiated: 06/05/2021 T arget Resolution Date: 06/05/2021 Goal Status: Active Patient and/or family will be able to state/discuss factors appropriate to the management of their disease process during treatment Date Initiated: 06/05/2021 T arget Resolution Date: 06/05/2021 Goal Status: Active Patient will tolerate the hyperbaric oxygen therapy treatment Date Initiated: 06/05/2021 T arget Resolution Date: 06/05/2021 Goal Status: Active Patient will tolerate the internal climate of the chamber Date Initiated: 06/05/2021 T arget Resolution Date:  06/05/2021 Goal Status: Active Patient/caregiver will verbalize understanding of HBO goals, rationale, procedures and potential hazards Date Initiated: 06/05/2021 T arget Resolution Date: 06/05/2021 Goal Status: Active Signs and symptoms of pulmonary oxygen toxicity will be recognized and promptly addressed Date Initiated: 06/05/2021 T arget Resolution Date: 06/05/2021 Goal Status: Active Signs and symptoms of seizure will be recognized and promptly addressed ; seizing patients will suffer no harm Date Initiated: 06/05/2021 T arget Resolution Date: 06/05/2021 Goal Status: Active Interventions: Administer a five (5) minute air break for patient if signs and symptoms of seizure appear and notify the hyperbaric physician Administer decongestants, per physician orders, prior to HBO2 Administer the correct therapeutic gas delivery based on the patients needs and limitations, per physician order Assess and provide for patients comfort related to the hyperbaric environment and equalization of middle ear Assess for signs and symptoms related to adverse events, including but not limited to confinement anxiety, pneumothorax, oxygen toxicity and baurotrauma Assess patient for any history of confinement anxiety Assess patient's knowledge and expectations regarding hyperbaric medicine and provide education related to the hyperbaric environment, goals of treatment and prevention of adverse events Implement protocols to decrease risk of pneumothorax in high risk patients Notes: Osteomyelitis Nursing Diagnoses: Infection: osteomyelitis Knowledge deficit related to disease process and management Potential for infection: osteomyelitis Goals: Diagnostic evaluation for osteomyelitis completed as ordered Date Initiated: 06/05/2021 Target Resolution Date: 06/05/2021 Goal Status: Active Patient/caregiver will verbalize understanding of disease process and disease management Date Initiated: 06/05/2021 Target  Resolution Date: 06/05/2021 Goal Status: Active [name] osteomyelitis will resolve Date Initiated: 06/05/2021 Target Resolution Date: 06/05/2021 Goal Status: Active Signs and symptoms for osteomyelitis will be recognized and promptly addressed Date Initiated: 06/05/2021 Target Resolution Date: 06/05/2021 Goal Status: Active Interventions: Assess for signs and symptoms of osteomyelitis resolution every visit Provide education on osteomyelitis Sinkler, [name] ([id number]) [id number] Page 6 of 9 Notes: Wound/Skin Impairment Nursing Diagnoses: Impaired tissue integrity Knowledge deficit related to ulceration/compromised skin integrity Goals: Ulcer/skin breakdown will have a volume reduction of 30% by week 4 Date Initiated: 05/18/2021 Target Resolution Date: 06/15/2021 Goal Status: Active Ulcer/skin breakdown will have a volume reduction of 50% by week 8 Date Initiated: 05/18/2021 Target Resolution Date: 07/13/2021 Goal Status: Active Ulcer/skin breakdown will have a volume reduction of 80% by week 12 Date Initiated: 05/18/2021 Target Resolution Date: 08/10/2021 Goal Status: Active Ulcer/skin breakdown will heal within 14 weeks Date Initiated: 05/18/2021 Target Resolution Date: 08/24/2021 Goal Status: Active Interventions: Assess patient/caregiver ability to obtain necessary supplies Assess patient/caregiver ability to perform ulcer/skin care regimen upon admission and as needed Assess ulceration(s) every visit Provide education on ulcer and skin care Notes: Electronic Signature(s) Signed: 12/21/2021 11:05:28 AM By: Gretta Cool, BSN, RN, CWS, Kim RN, BSN Signed: 12/24/2021 5:32:10 PM By: Clifton James,  Angie Entered By: Massie Kluver on 12/21/2021 10:40:27 -------------------------------------------------------------------------------- Pain Assessment Details Patient Name: Date of Service: Reynoso, [name] RLA M. 12/21/2021 10:30 A M Medical Record Number:  [id number] Patient Account Number: [id number] Date of Birth/Sex: Treating RN: 03-16-1970 (51 y.o. [name] Primary Care Nakhi Choi: Evern Bio Other Clinician: Massie Kluver Referring Rielly Corlett: Treating Ashyah Quizon/Extender: Darien Ramus Weeks in Treatment: 31 Active Problems Location of Pain Severity and Description of Pain Patient Has Paino Yes Site Locations Pain Location: [place], [name] ([id number]) [id number] Page 7 of 9 Pain Location: Generalized Pain, Pain in Ulcers Duration of the Pain. Constant / Intermittento Intermittent Rate the pain. Current Pain Level: 3 Character of Pain Describe the Pain: Sharp, Stabbing Pain Management and Medication Current Pain Management: Medication: Yes Rest: Yes Electronic Signature(s) Signed: 12/21/2021 11:05:28 AM By: Gretta Cool, BSN, RN, CWS, Kim RN, BSN Signed: 12/24/2021 5:32:10 PM By: Massie Kluver Entered By: Massie Kluver on 12/21/2021 10:31:05 -------------------------------------------------------------------------------- Patient/Caregiver Education Details Patient Name: Date of Service: [name], [name] RLA M. 10/27/2023andnbsp10:30 A M Medical Record Number: [id number] Patient Account Number: [id number] Date of Birth/Gender: Treating RN: [birth year] (51 y.o. [name] Primary Care Physician: Evern Bio Other Clinician: Massie Kluver Referring Physician: Treating Physician/Extender: Darien Ramus Weeks in Treatment: 59 Education Assessment Education Provided To: Patient Education Topics Provided Wound/Skin Impairment: Handouts: Other: continue wound care as directed Methods: Explain/Verbal Responses: State content correctly Electronic Signature(s) Signed: 12/24/2021 5:32:10 PM By: Massie Kluver Entered By: Massie Kluver on 12/21/2021 11:27:33 Pound, [name] ([id number]) [id number] Page 8 of  9 -------------------------------------------------------------------------------- Wound Assessment Details Patient Name: Date of Service: [name], [name] RLA M. 12/21/2021 10:30 A M Medical Record Number: [id number] Patient Account Number: [id number] Date of Birth/Sex: Treating RN: 05-26-1970 (51 y.o. [name] Primary Care Dvora Buitron: Evern Bio Other Clinician: Massie Kluver Referring Kaycee Mcgaugh: Treating Serena Petterson/Extender: Darien Ramus Weeks in Treatment: 31 Wound Status Wound Number: 2 Primary Diabetic Wound/Ulcer of the Lower Extremity Etiology: Wound Location: Right, Circumferential Foot Wound Open Wounding Event: Gradually Appeared Status: Date Acquired: 10/15/2020 Notes: open areas to midline right foot, lateral right foot, and top of right Weeks Of Treatment: 31 foot Clustered Wound: No Comorbid Cataracts, Anemia, Asthma, Chronic Obstructive Pulmonary Pending Amputation On Presentation History: Disease (COPD), Congestive Heart Failure, Hypertension, Type II Diabetes, End Stage Renal Disease, Osteoarthritis, Neuropathy Photos Wound Measurements Length: (cm) 14 Width: (cm) 8.5 Depth: (cm) 0.4 Area: (cm) 93.462 Volume: (cm) 37.385 % Reduction in Area: 11.9% % Reduction in Volume: 82.4% Wound Description Classification: Grade 4 Exudate Amount: Medium Exudate Type: Serosanguineous Exudate Color: red, brown Foul Odor After Cleansing: No Slough/Fibrino Yes Wound Bed Granulation Amount: Medium (34-66%) Exposed Structure Necrotic Amount: Medium (34-66%) Fascia Exposed: No Necrotic Quality: Eschar Fat Layer (Subcutaneous Tissue) Exposed: Yes Tendon Exposed: No Muscle Exposed: No Joint Exposed: No Bone Exposed: No Treatment Notes Wound #2 (Foot) Wound Laterality: Right, Circumferential Cleanser Dakin 16 (oz) 0.25 Discharge Instruction: wet/dry in open areas of foot. Moldovan, [name] ([id number]) [id number] Page 9 of  9 Normal Saline Discharge Instruction: Wash your hands with soap and water. Remove old dressing, discard into plastic bag and place into trash. Cleanse the wound with Normal Saline prior to applying a clean dressing using gauze sponges, not tissues or cotton balls. Do not scrub or use excessive force. Pat dry using gauze sponges, not tissue or cotton balls. Soap and Water Discharge Instruction: Gently cleanse wound with antibacterial soap, rinse and pat dry prior to dressing wounds Peri-Wound Care  Topical Betadine Discharge Instruction: Apply betadine as directed. apply to gauze and pack sites and lay op open areas to top of foot Primary Dressing Gauze Discharge Instruction: As directed: betadine applied Secondary Dressing ABD Pad 5x9 (in/in) Discharge Instruction: Cover with ABD pad Secured With Adel H Soft Cloth Surgical T ape ape, 2x2 (in/yd) Kerlix Roll Sterile or Non-Sterile 6-ply 4.5x4 (yd/yd) Discharge Instruction: Apply Kerlix as directed Stretch Net Dressing, Latex-free, Size 5, Small-Head / Shoulder / Thigh Compression Wrap Compression Stockings Add-Ons Electronic Signature(s) Signed: 12/24/2021 9:18:57 AM By: Gretta Cool, BSN, RN, CWS, Kim RN, BSN Signed: 12/24/2021 5:32:10 PM By: Massie Kluver Previous Signature: 12/21/2021 11:05:28 AM Version By: Gretta Cool, BSN, RN, CWS, Kim RN, BSN Entered By: Massie Kluver on 12/21/2021 11:16:50 -------------------------------------------------------------------------------- Vitals Details Patient Name: Date of Service: [name], [name] RLA M. 12/21/2021 10:30 A M Medical Record Number: [id number] Patient Account Number: [id number] Date of Birth/Sex: Treating RN: April 13, 1970 (51 y.o. [name] Primary Care Ada Woodbury: Evern Bio Other Clinician: Massie Kluver Referring Clarence Dunsmore: Treating Ahilyn Nell/Extender: Darien Ramus Weeks in Treatment: 31 Vital Signs Time Taken: 10:30 Temperature (F):  98.3 Pulse (bpm): 99 Respiratory Rate (breaths/min): 18 Blood Pressure (mmHg): 120/77 Reference Range: 80 - 120 mg / dl Electronic Signature(s) Signed: 12/24/2021 5:32:10 PM By: Massie Kluver Entered By: Massie Kluver on 12/21/2021 10:31:00

## 2021-12-22 DIAGNOSIS — N186 End stage renal disease: Secondary | ICD-10-CM | POA: Diagnosis not present

## 2021-12-22 DIAGNOSIS — E8779 Other fluid overload: Secondary | ICD-10-CM | POA: Diagnosis not present

## 2021-12-22 DIAGNOSIS — Z992 Dependence on renal dialysis: Secondary | ICD-10-CM | POA: Diagnosis not present

## 2021-12-24 ENCOUNTER — Encounter (INDEPENDENT_AMBULATORY_CARE_PROVIDER_SITE_OTHER): Payer: Self-pay

## 2021-12-24 ENCOUNTER — Telehealth: Payer: Self-pay | Admitting: Pulmonary Disease

## 2021-12-24 DIAGNOSIS — Z9981 Dependence on supplemental oxygen: Secondary | ICD-10-CM | POA: Diagnosis not present

## 2021-12-24 DIAGNOSIS — Z992 Dependence on renal dialysis: Secondary | ICD-10-CM | POA: Diagnosis not present

## 2021-12-24 DIAGNOSIS — L97513 Non-pressure chronic ulcer of other part of right foot with necrosis of muscle: Secondary | ICD-10-CM | POA: Diagnosis not present

## 2021-12-24 DIAGNOSIS — I5032 Chronic diastolic (congestive) heart failure: Secondary | ICD-10-CM | POA: Diagnosis not present

## 2021-12-24 DIAGNOSIS — E11621 Type 2 diabetes mellitus with foot ulcer: Secondary | ICD-10-CM | POA: Diagnosis not present

## 2021-12-24 DIAGNOSIS — I132 Hypertensive heart and chronic kidney disease with heart failure and with stage 5 chronic kidney disease, or end stage renal disease: Secondary | ICD-10-CM | POA: Diagnosis not present

## 2021-12-24 DIAGNOSIS — E1122 Type 2 diabetes mellitus with diabetic chronic kidney disease: Secondary | ICD-10-CM | POA: Diagnosis not present

## 2021-12-24 DIAGNOSIS — N186 End stage renal disease: Secondary | ICD-10-CM | POA: Diagnosis not present

## 2021-12-24 DIAGNOSIS — D631 Anemia in chronic kidney disease: Secondary | ICD-10-CM | POA: Diagnosis not present

## 2021-12-24 DIAGNOSIS — E1151 Type 2 diabetes mellitus with diabetic peripheral angiopathy without gangrene: Secondary | ICD-10-CM | POA: Diagnosis not present

## 2021-12-24 DIAGNOSIS — Z794 Long term (current) use of insulin: Secondary | ICD-10-CM | POA: Diagnosis not present

## 2021-12-24 DIAGNOSIS — Z7952 Long term (current) use of systemic steroids: Secondary | ICD-10-CM | POA: Diagnosis not present

## 2021-12-24 MED ORDER — ALBUTEROL SULFATE HFA 108 (90 BASE) MCG/ACT IN AERS: 2.0000 | INHALATION_SPRAY | Freq: Four times a day (QID) | RESPIRATORY_TRACT | 2 refills | Status: DC | PRN

## 2021-12-24 NOTE — Telephone Encounter (Signed)
Albuterol HFA has been sent to preferred pharmacy.  Patient is aware and voiced her understanding.  Nothing further needed.

## 2021-12-24 NOTE — Telephone Encounter (Signed)
I tried to speak with [name] with [facility] but no one seemed to know who she was and there were no notes about what she was calling about.  I called and spoke with Tomasa Hosteller with [facility] and her notes states that they spoke with [name] at [facility] and because the patient is non complaint with using her Bipap she will have to pay out of pocket for the nasal pillows until she proves that she is complaint with using her Bipap machine

## 2021-12-25 ENCOUNTER — Other Ambulatory Visit (HOSPITAL_COMMUNITY): Payer: Self-pay

## 2021-12-25 ENCOUNTER — Encounter: Payer: Self-pay | Admitting: Oncology

## 2021-12-25 DIAGNOSIS — E8779 Other fluid overload: Secondary | ICD-10-CM | POA: Diagnosis not present

## 2021-12-25 DIAGNOSIS — N186 End stage renal disease: Secondary | ICD-10-CM | POA: Diagnosis not present

## 2021-12-25 DIAGNOSIS — Z992 Dependence on renal dialysis: Secondary | ICD-10-CM | POA: Diagnosis not present

## 2021-12-25 NOTE — Telephone Encounter (Signed)
Spoke to patient.  She states that ventolin requires a PA.  PA team, please advise. Thanks

## 2021-12-25 NOTE — Telephone Encounter (Signed)
Both the Ventolin and the ProAir contain 200 doses. According to the prescription the patient is to inhale 8 puffs per day max. This is to last 25 days per inhaler, the patient received 3 inhalers 11/17/2021, insurance will not cover again until 01/24/2022; this is 68 days in between fills.

## 2021-12-25 NOTE — Telephone Encounter (Signed)
Patient states that her insurance will not cover Venelin prescription and will need a generic brand prescription  Patient uses [facility] and would like to be called back [phone]

## 2021-12-25 NOTE — Telephone Encounter (Signed)
Patient is aware of below message and voiced her understanding.  Nothing further needed.   

## 2021-12-25 NOTE — Telephone Encounter (Signed)
Spoke to patient and relayed below message. She stated that proair 8.5g is not lasting her a full month. She would like to switch to ventolin 18g. She is requesting that we complete PA.  Please advise. Thanks

## 2021-12-25 NOTE — Telephone Encounter (Signed)
Per test claims insurance will cover the generic ProAir 8.5g  albuterol inhaler.  Claim shows that 3 inhalers were last filled 11/17/2021, insurance will not cover again until 01/24/22 based on current usage.

## 2021-12-26 DIAGNOSIS — J45909 Unspecified asthma, uncomplicated: Secondary | ICD-10-CM | POA: Diagnosis not present

## 2021-12-26 DIAGNOSIS — E1122 Type 2 diabetes mellitus with diabetic chronic kidney disease: Secondary | ICD-10-CM | POA: Diagnosis not present

## 2021-12-26 DIAGNOSIS — D631 Anemia in chronic kidney disease: Secondary | ICD-10-CM | POA: Diagnosis not present

## 2021-12-26 DIAGNOSIS — E11621 Type 2 diabetes mellitus with foot ulcer: Secondary | ICD-10-CM | POA: Diagnosis not present

## 2021-12-26 DIAGNOSIS — Z992 Dependence on renal dialysis: Secondary | ICD-10-CM | POA: Diagnosis not present

## 2021-12-26 DIAGNOSIS — I132 Hypertensive heart and chronic kidney disease with heart failure and with stage 5 chronic kidney disease, or end stage renal disease: Secondary | ICD-10-CM | POA: Diagnosis not present

## 2021-12-26 DIAGNOSIS — N186 End stage renal disease: Secondary | ICD-10-CM | POA: Diagnosis not present

## 2021-12-26 DIAGNOSIS — I5040 Unspecified combined systolic (congestive) and diastolic (congestive) heart failure: Secondary | ICD-10-CM | POA: Diagnosis not present

## 2021-12-26 DIAGNOSIS — L97513 Non-pressure chronic ulcer of other part of right foot with necrosis of muscle: Secondary | ICD-10-CM | POA: Diagnosis not present

## 2021-12-26 DIAGNOSIS — Z7952 Long term (current) use of systemic steroids: Secondary | ICD-10-CM | POA: Diagnosis not present

## 2021-12-26 DIAGNOSIS — E1151 Type 2 diabetes mellitus with diabetic peripheral angiopathy without gangrene: Secondary | ICD-10-CM | POA: Diagnosis not present

## 2021-12-26 DIAGNOSIS — I5032 Chronic diastolic (congestive) heart failure: Secondary | ICD-10-CM | POA: Diagnosis not present

## 2021-12-26 DIAGNOSIS — Z9981 Dependence on supplemental oxygen: Secondary | ICD-10-CM | POA: Diagnosis not present

## 2021-12-26 DIAGNOSIS — E8779 Other fluid overload: Secondary | ICD-10-CM | POA: Diagnosis not present

## 2021-12-26 DIAGNOSIS — Z794 Long term (current) use of insulin: Secondary | ICD-10-CM | POA: Diagnosis not present

## 2021-12-27 DIAGNOSIS — E8779 Other fluid overload: Secondary | ICD-10-CM | POA: Diagnosis not present

## 2021-12-27 DIAGNOSIS — N186 End stage renal disease: Secondary | ICD-10-CM | POA: Diagnosis not present

## 2021-12-27 DIAGNOSIS — Z992 Dependence on renal dialysis: Secondary | ICD-10-CM | POA: Diagnosis not present

## 2021-12-28 ENCOUNTER — Encounter: Payer: Self-pay | Admitting: Cardiovascular Disease

## 2021-12-28 ENCOUNTER — Telehealth: Payer: Self-pay | Admitting: Pulmonary Disease

## 2021-12-28 DIAGNOSIS — E1151 Type 2 diabetes mellitus with diabetic peripheral angiopathy without gangrene: Secondary | ICD-10-CM | POA: Diagnosis not present

## 2021-12-28 DIAGNOSIS — D631 Anemia in chronic kidney disease: Secondary | ICD-10-CM | POA: Diagnosis not present

## 2021-12-28 DIAGNOSIS — E11621 Type 2 diabetes mellitus with foot ulcer: Secondary | ICD-10-CM | POA: Diagnosis not present

## 2021-12-28 DIAGNOSIS — E1122 Type 2 diabetes mellitus with diabetic chronic kidney disease: Secondary | ICD-10-CM | POA: Diagnosis not present

## 2021-12-28 DIAGNOSIS — J449 Chronic obstructive pulmonary disease, unspecified: Secondary | ICD-10-CM | POA: Diagnosis not present

## 2021-12-28 DIAGNOSIS — I132 Hypertensive heart and chronic kidney disease with heart failure and with stage 5 chronic kidney disease, or end stage renal disease: Secondary | ICD-10-CM | POA: Diagnosis not present

## 2021-12-28 DIAGNOSIS — Z7952 Long term (current) use of systemic steroids: Secondary | ICD-10-CM | POA: Diagnosis not present

## 2021-12-28 DIAGNOSIS — Z794 Long term (current) use of insulin: Secondary | ICD-10-CM | POA: Diagnosis not present

## 2021-12-28 DIAGNOSIS — Z9981 Dependence on supplemental oxygen: Secondary | ICD-10-CM | POA: Diagnosis not present

## 2021-12-28 DIAGNOSIS — L97513 Non-pressure chronic ulcer of other part of right foot with necrosis of muscle: Secondary | ICD-10-CM | POA: Diagnosis not present

## 2021-12-28 DIAGNOSIS — Z992 Dependence on renal dialysis: Secondary | ICD-10-CM | POA: Diagnosis not present

## 2021-12-28 DIAGNOSIS — I5032 Chronic diastolic (congestive) heart failure: Secondary | ICD-10-CM | POA: Diagnosis not present

## 2021-12-28 DIAGNOSIS — N186 End stage renal disease: Secondary | ICD-10-CM | POA: Diagnosis not present

## 2021-12-28 NOTE — Telephone Encounter (Signed)
I spoke with [place] with [employer] and she states they need the completed note and order and they can supply the patient with the mask.

## 2021-12-29 DIAGNOSIS — N186 End stage renal disease: Secondary | ICD-10-CM | POA: Diagnosis not present

## 2021-12-29 DIAGNOSIS — E8779 Other fluid overload: Secondary | ICD-10-CM | POA: Diagnosis not present

## 2021-12-29 DIAGNOSIS — Z992 Dependence on renal dialysis: Secondary | ICD-10-CM | POA: Diagnosis not present

## 2021-12-31 DIAGNOSIS — E11621 Type 2 diabetes mellitus with foot ulcer: Secondary | ICD-10-CM | POA: Diagnosis not present

## 2021-12-31 DIAGNOSIS — I132 Hypertensive heart and chronic kidney disease with heart failure and with stage 5 chronic kidney disease, or end stage renal disease: Secondary | ICD-10-CM | POA: Diagnosis not present

## 2021-12-31 DIAGNOSIS — Z7952 Long term (current) use of systemic steroids: Secondary | ICD-10-CM | POA: Diagnosis not present

## 2021-12-31 DIAGNOSIS — N186 End stage renal disease: Secondary | ICD-10-CM | POA: Diagnosis not present

## 2021-12-31 DIAGNOSIS — Z992 Dependence on renal dialysis: Secondary | ICD-10-CM | POA: Diagnosis not present

## 2021-12-31 DIAGNOSIS — Z794 Long term (current) use of insulin: Secondary | ICD-10-CM | POA: Diagnosis not present

## 2021-12-31 DIAGNOSIS — D631 Anemia in chronic kidney disease: Secondary | ICD-10-CM | POA: Diagnosis not present

## 2021-12-31 DIAGNOSIS — Z9981 Dependence on supplemental oxygen: Secondary | ICD-10-CM | POA: Diagnosis not present

## 2021-12-31 DIAGNOSIS — E1151 Type 2 diabetes mellitus with diabetic peripheral angiopathy without gangrene: Secondary | ICD-10-CM | POA: Diagnosis not present

## 2021-12-31 DIAGNOSIS — E1122 Type 2 diabetes mellitus with diabetic chronic kidney disease: Secondary | ICD-10-CM | POA: Diagnosis not present

## 2021-12-31 DIAGNOSIS — L97513 Non-pressure chronic ulcer of other part of right foot with necrosis of muscle: Secondary | ICD-10-CM | POA: Diagnosis not present

## 2021-12-31 DIAGNOSIS — I5032 Chronic diastolic (congestive) heart failure: Secondary | ICD-10-CM | POA: Diagnosis not present

## 2021-12-31 NOTE — Telephone Encounter (Signed)
CM sent to [place] per [facility] and to let them know the note has been completed

## 2021-12-31 NOTE — Telephone Encounter (Signed)
CM sent to [place] per [facility] and to let them know the note has been completed

## 2022-01-01 DIAGNOSIS — N186 End stage renal disease: Secondary | ICD-10-CM | POA: Diagnosis not present

## 2022-01-01 DIAGNOSIS — E8779 Other fluid overload: Secondary | ICD-10-CM | POA: Diagnosis not present

## 2022-01-01 DIAGNOSIS — Z992 Dependence on renal dialysis: Secondary | ICD-10-CM | POA: Diagnosis not present

## 2022-01-02 DIAGNOSIS — N186 End stage renal disease: Secondary | ICD-10-CM | POA: Diagnosis not present

## 2022-01-02 DIAGNOSIS — Z7952 Long term (current) use of systemic steroids: Secondary | ICD-10-CM | POA: Diagnosis not present

## 2022-01-02 DIAGNOSIS — Z794 Long term (current) use of insulin: Secondary | ICD-10-CM | POA: Diagnosis not present

## 2022-01-02 DIAGNOSIS — D631 Anemia in chronic kidney disease: Secondary | ICD-10-CM | POA: Diagnosis not present

## 2022-01-02 DIAGNOSIS — I132 Hypertensive heart and chronic kidney disease with heart failure and with stage 5 chronic kidney disease, or end stage renal disease: Secondary | ICD-10-CM | POA: Diagnosis not present

## 2022-01-02 DIAGNOSIS — E11621 Type 2 diabetes mellitus with foot ulcer: Secondary | ICD-10-CM | POA: Diagnosis not present

## 2022-01-02 DIAGNOSIS — E1122 Type 2 diabetes mellitus with diabetic chronic kidney disease: Secondary | ICD-10-CM | POA: Diagnosis not present

## 2022-01-02 DIAGNOSIS — Z9981 Dependence on supplemental oxygen: Secondary | ICD-10-CM | POA: Diagnosis not present

## 2022-01-02 DIAGNOSIS — L97513 Non-pressure chronic ulcer of other part of right foot with necrosis of muscle: Secondary | ICD-10-CM | POA: Diagnosis not present

## 2022-01-02 DIAGNOSIS — E1151 Type 2 diabetes mellitus with diabetic peripheral angiopathy without gangrene: Secondary | ICD-10-CM | POA: Diagnosis not present

## 2022-01-02 DIAGNOSIS — I5032 Chronic diastolic (congestive) heart failure: Secondary | ICD-10-CM | POA: Diagnosis not present

## 2022-01-02 DIAGNOSIS — Z992 Dependence on renal dialysis: Secondary | ICD-10-CM | POA: Diagnosis not present

## 2022-01-03 ENCOUNTER — Telehealth: Payer: Self-pay | Admitting: Pulmonary Disease

## 2022-01-03 DIAGNOSIS — Z992 Dependence on renal dialysis: Secondary | ICD-10-CM | POA: Diagnosis not present

## 2022-01-03 DIAGNOSIS — N186 End stage renal disease: Secondary | ICD-10-CM | POA: Diagnosis not present

## 2022-01-03 DIAGNOSIS — E8779 Other fluid overload: Secondary | ICD-10-CM | POA: Diagnosis not present

## 2022-01-03 DIAGNOSIS — G4733 Obstructive sleep apnea (adult) (pediatric): Secondary | ICD-10-CM

## 2022-01-03 NOTE — Telephone Encounter (Signed)
Unfortunately, to qualify her for BiPAP she needs a in lab titration study.  Previously a home study was done because she was not on oxygen at that time and only because it was to diagnose the sleep apnea.  To be able to qualify her for BiPAP she needs an in lab study.

## 2022-01-03 NOTE — Telephone Encounter (Signed)
Called and spoke to patient.  She stated that she received a call from [place] stating that bipap order needed to go to [place].  Per [facility], patient would need a repeat sleep study since she is considered non compliant with bipap machine.  Dr. Patsey Berthold, please advise if okay to repeat sleep study. She would like HST, however she wears 3L QHS.

## 2022-01-03 NOTE — Telephone Encounter (Signed)
Please refer to 12/28/2021 phone note.  Lm for patient.

## 2022-01-03 NOTE — Telephone Encounter (Signed)
Patient is aware of below message/recommendations and voiced her understanding.  She agrees with in lab titration study. Order has been placed. Nothing further needed.

## 2022-01-04 DIAGNOSIS — Z7952 Long term (current) use of systemic steroids: Secondary | ICD-10-CM | POA: Diagnosis not present

## 2022-01-04 DIAGNOSIS — E1151 Type 2 diabetes mellitus with diabetic peripheral angiopathy without gangrene: Secondary | ICD-10-CM | POA: Diagnosis not present

## 2022-01-04 DIAGNOSIS — Z794 Long term (current) use of insulin: Secondary | ICD-10-CM | POA: Diagnosis not present

## 2022-01-04 DIAGNOSIS — E1122 Type 2 diabetes mellitus with diabetic chronic kidney disease: Secondary | ICD-10-CM | POA: Diagnosis not present

## 2022-01-04 DIAGNOSIS — E8779 Other fluid overload: Secondary | ICD-10-CM | POA: Diagnosis not present

## 2022-01-04 DIAGNOSIS — Z992 Dependence on renal dialysis: Secondary | ICD-10-CM | POA: Diagnosis not present

## 2022-01-04 DIAGNOSIS — L97513 Non-pressure chronic ulcer of other part of right foot with necrosis of muscle: Secondary | ICD-10-CM | POA: Diagnosis not present

## 2022-01-04 DIAGNOSIS — E11621 Type 2 diabetes mellitus with foot ulcer: Secondary | ICD-10-CM | POA: Diagnosis not present

## 2022-01-04 DIAGNOSIS — D631 Anemia in chronic kidney disease: Secondary | ICD-10-CM | POA: Diagnosis not present

## 2022-01-04 DIAGNOSIS — I5032 Chronic diastolic (congestive) heart failure: Secondary | ICD-10-CM | POA: Diagnosis not present

## 2022-01-04 DIAGNOSIS — Z9981 Dependence on supplemental oxygen: Secondary | ICD-10-CM | POA: Diagnosis not present

## 2022-01-04 DIAGNOSIS — I132 Hypertensive heart and chronic kidney disease with heart failure and with stage 5 chronic kidney disease, or end stage renal disease: Secondary | ICD-10-CM | POA: Diagnosis not present

## 2022-01-04 DIAGNOSIS — N186 End stage renal disease: Secondary | ICD-10-CM | POA: Diagnosis not present

## 2022-01-05 DIAGNOSIS — E8779 Other fluid overload: Secondary | ICD-10-CM | POA: Diagnosis not present

## 2022-01-05 DIAGNOSIS — N186 End stage renal disease: Secondary | ICD-10-CM | POA: Diagnosis not present

## 2022-01-05 DIAGNOSIS — Z992 Dependence on renal dialysis: Secondary | ICD-10-CM | POA: Diagnosis not present

## 2022-01-08 DIAGNOSIS — N186 End stage renal disease: Secondary | ICD-10-CM | POA: Diagnosis not present

## 2022-01-08 DIAGNOSIS — E8779 Other fluid overload: Secondary | ICD-10-CM | POA: Diagnosis not present

## 2022-01-08 DIAGNOSIS — Z992 Dependence on renal dialysis: Secondary | ICD-10-CM | POA: Diagnosis not present

## 2022-01-09 DIAGNOSIS — N186 End stage renal disease: Secondary | ICD-10-CM | POA: Diagnosis not present

## 2022-01-09 DIAGNOSIS — E1151 Type 2 diabetes mellitus with diabetic peripheral angiopathy without gangrene: Secondary | ICD-10-CM | POA: Diagnosis not present

## 2022-01-09 DIAGNOSIS — Z794 Long term (current) use of insulin: Secondary | ICD-10-CM | POA: Diagnosis not present

## 2022-01-09 DIAGNOSIS — D631 Anemia in chronic kidney disease: Secondary | ICD-10-CM | POA: Diagnosis not present

## 2022-01-09 DIAGNOSIS — Z9981 Dependence on supplemental oxygen: Secondary | ICD-10-CM | POA: Diagnosis not present

## 2022-01-09 DIAGNOSIS — L97513 Non-pressure chronic ulcer of other part of right foot with necrosis of muscle: Secondary | ICD-10-CM | POA: Diagnosis not present

## 2022-01-09 DIAGNOSIS — Z992 Dependence on renal dialysis: Secondary | ICD-10-CM | POA: Diagnosis not present

## 2022-01-09 DIAGNOSIS — Z7952 Long term (current) use of systemic steroids: Secondary | ICD-10-CM | POA: Diagnosis not present

## 2022-01-09 DIAGNOSIS — I5032 Chronic diastolic (congestive) heart failure: Secondary | ICD-10-CM | POA: Diagnosis not present

## 2022-01-09 DIAGNOSIS — E11621 Type 2 diabetes mellitus with foot ulcer: Secondary | ICD-10-CM | POA: Diagnosis not present

## 2022-01-09 DIAGNOSIS — I132 Hypertensive heart and chronic kidney disease with heart failure and with stage 5 chronic kidney disease, or end stage renal disease: Secondary | ICD-10-CM | POA: Diagnosis not present

## 2022-01-09 DIAGNOSIS — E1122 Type 2 diabetes mellitus with diabetic chronic kidney disease: Secondary | ICD-10-CM | POA: Diagnosis not present

## 2022-01-09 NOTE — Telephone Encounter (Signed)
Bipap Titration Study ordered on 01/03/22 and patient has been scheduled to do sleep study on 01/15/22

## 2022-01-09 NOTE — Telephone Encounter (Signed)
Bipap Titration Study ordered on 01/03/22 patient has been scheduled to do sleep study on 01/15/22

## 2022-01-10 DIAGNOSIS — Z992 Dependence on renal dialysis: Secondary | ICD-10-CM | POA: Diagnosis not present

## 2022-01-10 DIAGNOSIS — N186 End stage renal disease: Secondary | ICD-10-CM | POA: Diagnosis not present

## 2022-01-10 DIAGNOSIS — E8779 Other fluid overload: Secondary | ICD-10-CM | POA: Diagnosis not present

## 2022-01-11 ENCOUNTER — Encounter: Payer: Medicare Other | Attending: Physician Assistant | Admitting: Physician Assistant

## 2022-01-11 DIAGNOSIS — I5042 Chronic combined systolic (congestive) and diastolic (congestive) heart failure: Secondary | ICD-10-CM | POA: Insufficient documentation

## 2022-01-11 DIAGNOSIS — E1122 Type 2 diabetes mellitus with diabetic chronic kidney disease: Secondary | ICD-10-CM | POA: Diagnosis not present

## 2022-01-11 DIAGNOSIS — N186 End stage renal disease: Secondary | ICD-10-CM | POA: Insufficient documentation

## 2022-01-11 DIAGNOSIS — I132 Hypertensive heart and chronic kidney disease with heart failure and with stage 5 chronic kidney disease, or end stage renal disease: Secondary | ICD-10-CM | POA: Insufficient documentation

## 2022-01-11 DIAGNOSIS — E11621 Type 2 diabetes mellitus with foot ulcer: Secondary | ICD-10-CM | POA: Insufficient documentation

## 2022-01-11 DIAGNOSIS — M86371 Chronic multifocal osteomyelitis, right ankle and foot: Secondary | ICD-10-CM | POA: Diagnosis not present

## 2022-01-11 DIAGNOSIS — Z992 Dependence on renal dialysis: Secondary | ICD-10-CM | POA: Insufficient documentation

## 2022-01-11 DIAGNOSIS — L97514 Non-pressure chronic ulcer of other part of right foot with necrosis of bone: Secondary | ICD-10-CM | POA: Diagnosis not present

## 2022-01-11 DIAGNOSIS — Z9981 Dependence on supplemental oxygen: Secondary | ICD-10-CM | POA: Diagnosis not present

## 2022-01-11 NOTE — Progress Notes (Signed)
[place], [name] ([id number]) [id number] Page 1 of 4 Visit Report for 01/11/2022 Chief Complaint Document Details Patient Name: Date of Service: [name], [place] [name] M. 01/11/2022 10:30 A M Medical Record Number: [id number] Patient Account Number: [id number] Date of Birth/Sex: Treating RN: 09-Aug-1970 (52 y.o. [name] Primary Care Provider: Evern Bio Other Clinician: Massie Kluver Referring Provider: Treating Provider/Extender: Darien Ramus Weeks in Treatment: 67 Information Obtained from: Patient Chief Complaint Right foot gangrene and osteomyelitis Electronic Signature(s) Signed: 01/11/2022 11:03:06 AM By: Worthy Keeler PA-C Entered By: Worthy Keeler on 01/11/2022 11:03:06 -------------------------------------------------------------------------------- Physician Orders Details Patient Name: Date of Service: [place], [place] [name] M. 01/11/2022 10:30 A M Medical Record Number: [id number] Patient Account Number: [id number] Date of Birth/Sex: Treating RN: Jan 13, 1971 (51 y.o. [name] Primary Care Provider: Evern Bio Other Clinician: Massie Kluver Referring Provider: Treating Provider/Extender: Darien Ramus Weeks in Treatment: 39 Verbal / Phone Orders: No Diagnosis Coding ICD-10 Coding Code Description M86.371 Chronic multifocal osteomyelitis, right ankle and foot E11.621 Type 2 diabetes mellitus with foot ulcer L97.514 Non-pressure chronic ulcer of other part of right foot with necrosis of bone I50.42 Chronic combined systolic (congestive) and diastolic (congestive) heart failure I10 Essential (primary) hypertension N18.6 End stage renal disease Z99.2 Dependence on renal dialysis Z99.81 Dependence on supplemental oxygen [name], [name] ([id number]) [id number] Page 2 of 4 Follow-up Appointments Return Appointment in 3 weeks. Nurse Visit as needed [facility] for wound care. May utilize formulary equivalent dressing for wound treatment orders unless otherwise specified. Home Health Nurse may visit [facility] to address patients wound care needs. Scheduled days for dressing changes to be completed; exception, patient has scheduled wound care visit that day. **Please direct any NON-WOUND related issues/requests for orders to patient's Primary Care Physician. **If current dressing causes regression in wound condition, may D/C ordered dressing product/s and apply Normal Saline Moist Dressing daily until next [facility] or Other MD appointment. **Notify [facility] of regression in wound condition at [phone]. Bathing/ Shower/ Hygiene Clean wound with Normal Saline or wound cleanser. Wash wounds with antibacterial soap and water. May shower; gently cleanse wound with antibacterial soap, rinse and pat dry prior to dressing wounds No tub bath. Off-Loading Open toe surgical shoe Hyperbaric Oxygen Therapy Evaluate for HBO Therapy Indication and location: - right foot If appropriate for treatment, begin HBOT per protocol: 2.5 ATA for 90 Minutes with 2 Five (5) Minute A Breaks ir One treatment per day (delivered Monday through Friday unless otherwise specified in Special Instructions below): Total # of Treatments: - 40 Finger stick Blood Glucose Pre- and Post- HBOT Treatment. Follow Hyperbaric Oxygen Glycemia Protocol Wound Treatment Wound #2 - Foot Wound Laterality: Right, Circumferential Cleanser: Dakin 16 (oz) 0.25 1 x Per Day/30 Days Discharge Instructions: wet/dry in open areas of foot. Cleanser: Normal Saline 1 x Per Day/30 Days Discharge Instructions: Wash your hands with soap and water. Remove old dressing, discard into plastic bag and place into trash. Cleanse the wound with Normal Saline prior to applying a clean dressing using gauze sponges, not tissues or cotton balls. Do not scrub or  use excessive force. Pat dry using gauze sponges, not tissue or cotton balls. Cleanser: Soap and Water 1 x Per Day/30 Days Discharge Instructions: Gently cleanse wound with antibacterial soap, rinse and pat dry prior to dressing wounds Topical: Betadine 1 x Per Day/30 Days Discharge Instructions: Apply betadine as directed. apply to  gauze and pack sites and lay op open areas to top of foot Prim Dressing: Gauze 1 x Per Day/30 Days ary Discharge Instructions: As directed: betadine applied Prim Dressing: Curad Oil Emulsion Gauze Dressing, 3x8 (in/in) 1 x Per Day/30 Days ary Discharge Instructions: apply to pink areas of foot Secondary Dressing: ABD Pad 5x9 (in/in) 1 x Per Day/30 Days Discharge Instructions: Cover with ABD pad Secured With: Medipore T - 62M Medipore H Soft Cloth Surgical T ape ape, 2x2 (in/yd) 1 x Per Day/30 Days Secured With: [employer] Sterile or Non-Sterile 6-ply 4.5x4 (yd/yd) 1 x Per Day/30 Days Discharge Instructions: Apply Kerlix as directed Secured With: [employer] Dressing, Latex-free, Size 5, Small-Head / Shoulder / Thigh 1 x Per Day/30 Days GLYCEMIA INTERVENTIONS PROTOCOL PRE-HBO GLYCEMIA INTERVENTIONS ACTION INTERVENTION Obtain pre-HBO capillary blood glucose (ensure 1 physician order is in chart). A. Notify HBO physician and await physician orders. 2 If result is 70 mg/dl or below: B. If the result meets the hospital definition of a critical result, follow hospital policy. A. Give patient an 8 ounce Glucerna Shake, an 8 ounce Ensure, or 8 ounces of a Glucerna/Ensure equivalent dietary supplement*. B. Wait 30 minutes. If result is 71 mg/dl to 130 mg/dl: C. Retest patients capillary blood glucose (CBG). D. If result greater than or equal to 110 mg/dl, [name] ([id number]) [id number] Page 3 of 4 proceed with HBO. If result less than 110 mg/dl, notify HBO physician and consider holding HBO. If result is 131 mg/dl  to 249 mg/dl: A. Proceed with HBO. A. Notify HBO physician and await physician orders. B. It is recommended to hold HBO and do If result is 250 mg/dl or greater: blood/urine ketone testing. C. If the result meets the hospital definition of a critical result, follow hospital policy. POST-HBO GLYCEMIA INTERVENTIONS ACTION INTERVENTION Obtain post HBO capillary blood glucose (ensure 1 physician order is in chart). A. Notify HBO physician and await physician orders. 2 If result is 70 mg/dl or below: B. If the result meets the hospital definition of a critical result, follow hospital policy. A. Give patient an 8 ounce Glucerna Shake, an 8 ounce Ensure, or 8 ounces of a Glucerna/Ensure equivalent dietary supplement*. B. Wait 15 minutes for symptoms of If result is 71 mg/dl to 100 mg/dl: hypoglycemia (i.e. nervousness, anxiety, sweating, chills, clamminess, irritability, confusion, tachycardia or dizziness). C. If patient asymptomatic, discharge patient. If patient symptomatic, repeat capillary blood glucose (CBG) and notify HBO physician. If result is 101 mg/dl to 249 mg/dl: A. Discharge patient. A. Notify HBO physician and await physician orders. B. It is recommended to do blood/urine ketone If result is 250 mg/dl or greater: testing. C. If the result meets the hospital definition of a critical result, follow hospital policy. *Juice or candies are NOT equivalent products. If patient refuses the Glucerna or Ensure, please consult the hospital dietitian for an appropriate substitute. Electronic Signature(s) Unsigned Entered By: Massie Kluver on 01/11/2022 11:10:51 -------------------------------------------------------------------------------- Problem List Details Patient Name: Date of Service: [place], [place] [name] M. 01/11/2022 10:30 A M Medical Record Number: [id number] Patient Account Number: [id number] Date of Birth/Sex: Treating RN: 11/28/1970 (51 y.o. [name] Primary  Care Provider: Evern Bio Other Clinician: Massie Kluver Referring Provider: Treating Provider/Extender: Darien Ramus Weeks in Treatment: 67 Active Problems ICD-10 Encounter Code Description Active Date MDM Diagnosis M86.371 Chronic multifocal osteomyelitis, right ankle and foot 05/18/2021 No Yes E11.621 Type 2 diabetes mellitus with foot ulcer 05/18/2021 No Yes Heino, Mili M (  [id number]) [id number] Page 4 of 4 L97.514 Non-pressure chronic ulcer of other part of right foot with necrosis of bone 05/18/2021 No Yes I50.42 Chronic combined systolic (congestive) and diastolic (congestive) heart failure 05/18/2021 No Yes I10 Essential (primary) hypertension 05/18/2021 No Yes N18.6 End stage renal disease 05/18/2021 No Yes Z99.2 Dependence on renal dialysis 05/18/2021 No Yes Z99.81 Dependence on supplemental oxygen 05/18/2021 No Yes Inactive Problems Resolved Problems Electronic Signature(s) Signed: 01/11/2022 11:03:03 AM By: Worthy Keeler PA-C Entered By: Worthy Keeler on 01/11/2022 11:03:03

## 2022-01-12 DIAGNOSIS — N186 End stage renal disease: Secondary | ICD-10-CM | POA: Diagnosis not present

## 2022-01-12 DIAGNOSIS — Z992 Dependence on renal dialysis: Secondary | ICD-10-CM | POA: Diagnosis not present

## 2022-01-12 DIAGNOSIS — E8779 Other fluid overload: Secondary | ICD-10-CM | POA: Diagnosis not present

## 2022-01-14 NOTE — Progress Notes (Signed)
[name], [name] ([id number]) [id number] Page 1 of 10 Visit Report for 01/11/2022 Arrival Information Details Patient Name: Date of Service: [name], [name] 01/11/2022 10:30 A M Medical Record Number: [id number] Patient Account Number: [id number] Date of Birth/Sex: Treating RN: 02/04/1971 (51 y.o. [name] Primary Care Shawan Corella: Evern Bio Other Clinician: Massie Kluver Referring Maresa Morash: Treating Andrea Colglazier/Extender: Darien Ramus Weeks in Treatment: 26 Visit Information History Since Last Visit All ordered tests and consults were completed: No Patient Arrived: Wheel Chair Added or deleted any medications: No Arrival Time: 10:39 Any new allergies or adverse reactions: No Transfer Assistance: EasyPivot Patient Lift Had a fall or experienced change in No Patient Identification Verified: Yes activities of daily living that may affect Secondary Verification Process Completed: Yes risk of falls: Patient Requires Transmission-Based Precautions: No Signs or symptoms of abuse/neglect since last visito No Patient Has Alerts: No Hospitalized since last visit: No Implantable device outside of the clinic excluding No cellular tissue based products placed in the center since last visit: Pain Present Now: Yes Electronic Signature(s) Signed: 01/11/2022 12:54:23 PM By: Massie Kluver Entered By: Massie Kluver on 01/11/2022 10:45:02 -------------------------------------------------------------------------------- Clinic Level of Care Assessment Details Patient Name: Date of Service: [name], [name] 01/11/2022 10:30 A M Medical Record Number: [id number] Patient Account Number: [id number] Date of Birth/Sex: Treating RN: 1970-11-19 (51 y.o. [name] Primary Care [name]: Evern Bio Other Clinician: Massie Kluver Referring [name]: Treating [name]/Extender: Darien Ramus Weeks in Treatment: 20 Clinic Level of  Care Assessment Items TOOL 1 Quantity Score '[]'$  - 0 Use when EandM and Procedure is performed on INITIAL visit ASSESSMENTS - Nursing Assessment / Reassessment '[]'$  - 0 General Physical Exam (combine w/ comprehensive assessment (listed just below) when performed on new pt. evals) '[]'$  - 0 Comprehensive Assessment (HX, ROS, Risk Assessments, Wounds Hx, etc.) [name], [name] ([id number]) [id number] Page 2 of 10 ASSESSMENTS - Wound and Skin Assessment / Reassessment '[]'$  - 0 Dermatologic / Skin Assessment (not related to wound area) ASSESSMENTS - Ostomy and/or Continence Assessment and Care '[]'$  - 0 Incontinence Assessment and Management '[]'$  - 0 Ostomy Care Assessment and Management (repouching, etc.) PROCESS - Coordination of Care '[]'$  - 0 Simple Patient / Family Education for ongoing care '[]'$  - 0 Complex (extensive) Patient / Family Education for ongoing care '[]'$  - 0 Staff obtains Programmer, systems, Records, T Results / Process Orders est '[]'$  - 0 Staff telephones HHA, Nursing Homes / Clarify orders / etc '[]'$  - 0 Routine Transfer to another Facility (non-emergent condition) '[]'$  - 0 Routine Hospital Admission (non-emergent condition) '[]'$  - 0 New Admissions / Biomedical engineer / Ordering NPWT Apligraf, etc. , '[]'$  - 0 Emergency Hospital Admission (emergent condition) PROCESS - Special Needs '[]'$  - 0 Pediatric / Minor Patient Management '[]'$  - 0 Isolation Patient Management '[]'$  - 0 Hearing / Language / Visual special needs '[]'$  - 0 Assessment of Community assistance (transportation, D/C planning, etc.) '[]'$  - 0 Additional assistance / Altered mentation '[]'$  - 0 Support Surface(s) Assessment (bed, cushion, seat, etc.) INTERVENTIONS - Miscellaneous '[]'$  - 0 External ear exam '[]'$  - 0 Patient Transfer (multiple staff / Civil Service fast streamer / Similar devices) '[]'$  - 0 Simple Staple / Suture removal (25 or less) '[]'$  - 0 Complex Staple / Suture removal (26 or more) '[]'$  - 0 Hypo/Hyperglycemic  Management (do not check if billed separately) '[]'$  - 0 Ankle / Brachial Index (ABI) - do not check if billed separately Has the patient been seen at  the hospital within the last three years: Yes Total Score: 0 Level Of Care: ____ Electronic Signature(s) Signed: 01/11/2022 12:54:23 PM By: Massie Kluver Entered By: Massie Kluver on 01/11/2022 11:23:09 -------------------------------------------------------------------------------- Encounter Discharge Information Details Patient Name: Date of Service: [name], [name] 01/11/2022 10:30 A M Medical Record Number: [id number] Patient Account Number: [id number] Date of Birth/Sex: Treating RN: 1970/03/02 (51 y.o. [name] Primary Care Jaskarn Schweer: Evern Bio Other Clinician: Massie Kluver Referring [name]: Treating [name]/Extender: Darien Ramus Weeks in Treatment: [address] [place] ([id number]) [id number] Page 3 of 10 Encounter Discharge Information Items Post Procedure Vitals Discharge Condition: Stable Temperature (F): 99.1 Ambulatory Status: Wheelchair Pulse (bpm): 91 Discharge Destination: Home Respiratory Rate (breaths/min): 18 Transportation: Private Auto Blood Pressure (mmHg): 122/72 Accompanied By: husband Schedule Follow-up Appointment: Yes Clinical Summary of Care: Electronic Signature(s) Signed: 01/11/2022 12:54:23 PM By: Massie Kluver Entered By: Massie Kluver on 01/11/2022 12:31:02 -------------------------------------------------------------------------------- Lower Extremity Assessment Details Patient Name: Date of Service: [name], [name] 01/11/2022 10:30 A M Medical Record Number: [id number] Patient Account Number: [id number] Date of Birth/Sex: Treating RN: Aug 30, 1970 (51 y.o. [name] Primary Care [name]: Evern Bio Other Clinician: Massie Kluver Referring [name]: Treating [name]/Extender: Darien Ramus Weeks in  Treatment: 34 Electronic Signature(s) Signed: 01/11/2022 12:54:23 PM By: Massie Kluver Signed: 01/14/2022 1:46:28 PM By: Gretta Cool, BSN, RN, CWS, Kim RN, BSN Entered By: Massie Kluver on 01/11/2022 10:58:54 -------------------------------------------------------------------------------- Multi Wound Chart Details Patient Name: Date of Service: [name], [name] 01/11/2022 10:30 A M Medical Record Number: [id number] Patient Account Number: [id number] Date of Birth/Sex: Treating RN: 1970/11/13 (51 y.o. [name] Primary Care Minnetta Sandora: Evern Bio Other Clinician: Massie Kluver Referring Yahia Bottger: Treating Shade Kaley/Extender: Darien Ramus Weeks in Treatment: 98 Vital Signs Height(in): Pulse(bpm): 96 Weight(lbs): Blood Pressure(mmHg): 122/72 Body Mass Index(BMI): Temperature(F): 99.1 Respiratory Rate(breaths/min): 18 Donaldson, Samariah M ([id number]) [2:Photos:] [N/A:N/A] Right, Circumferential Foot N/A N/A Wound Location: Gradually Appeared N/A N/A Wounding Event: Diabetic Wound/Ulcer of the Lower N/A N/A Primary Etiology: Extremity Cataracts, Anemia, Asthma, Chronic N/A N/A Comorbid History: Obstructive Pulmonary Disease (COPD), Congestive Heart Failure, Hypertension, Type II Diabetes, End Stage Renal Disease, Osteoarthritis, Neuropathy 10/15/2020 N/A N/A Date Acquired: 53 N/A N/A Weeks of Treatment: Open N/A N/A Wound Status: No N/A N/A Wound Recurrence: Yes N/A N/A Pending A mputation on Presentation: 10.5x7.5x0.2 N/A N/A Measurements L x W x D (cm) 61.85 N/A N/A A (cm) : rea 12.37 N/A N/A Volume (cm) : 41.70% N/A N/A % Reduction in A rea: 94.20% N/A N/A % Reduction in Volume: Grade 4 N/A N/A Classification: Medium N/A N/A Exudate A mount: Serosanguineous N/A N/A Exudate Type: red, brown N/A N/A Exudate Color: Medium (34-66%) N/A N/A Granulation A mount: Medium (34-66%) N/A N/A Necrotic A mount: Eschar N/A N/A Necrotic  Tissue: Fat Layer (Subcutaneous Tissue): Yes N/A N/A Exposed Structures: Fascia: No Tendon: No Muscle: No Joint: No Bone: No Treatment Notes Electronic Signature(s) Signed: 01/11/2022 12:54:23 PM By: Massie Kluver Entered By: Massie Kluver on 01/11/2022 10:59:31 -------------------------------------------------------------------------------- Multi-Disciplinary Care Plan Details Patient Name: Date of Service: [name], [name] 01/11/2022 10:30 A M Medical Record Number: [id number] Patient Account Number: [id number] Date of Birth/Sex: Treating RN: June 15, 1970 (51 y.o. [address], [address] [place], [place] ([id number]) [id number] Page 5 of 10 Primary Care Ronnae Kaser: Evern Bio Other Clinician: Massie Kluver Referring Delvonte Berenson: Treating Arvie Villarruel/Extender: Darien Ramus Weeks in Treatment: 8 Active Inactive HBO Nursing Diagnoses: Anxiety related to feelings of confinement associated with  the hyperbaric oxygen chamber Anxiety related to knowledge deficit of hyperbaric oxygen therapy and treatment procedures Discomfort related to temperature and humidity changes inside hyperbaric chamber Potential for barotraumas to ears, sinuses, teeth, and lungs or cerebral gas embolism related to changes in atmospheric pressure inside hyperbaric oxygen chamber Potential for oxygen toxicity seizures related to delivery of 100% oxygen at an increased atmospheric pressure Potential for pulmonary oxygen toxicity related to delivery of 100% oxygen at an increased atmospheric pressure Goals: Barotrauma will be prevented during HBO2 Date Initiated: 06/05/2021 T arget Resolution Date: 06/05/2021 Goal Status: Active Patient and/or family will be able to state/discuss factors appropriate to the management of their disease process during treatment Date Initiated: 06/05/2021 T arget Resolution Date: 06/05/2021 Goal Status: Active Patient will tolerate the hyperbaric  oxygen therapy treatment Date Initiated: 06/05/2021 T arget Resolution Date: 06/05/2021 Goal Status: Active Patient will tolerate the internal climate of the chamber Date Initiated: 06/05/2021 T arget Resolution Date: 06/05/2021 Goal Status: Active Patient/caregiver will verbalize understanding of HBO goals, rationale, procedures and potential hazards Date Initiated: 06/05/2021 T arget Resolution Date: 06/05/2021 Goal Status: Active Signs and symptoms of pulmonary oxygen toxicity will be recognized and promptly addressed Date Initiated: 06/05/2021 T arget Resolution Date: 06/05/2021 Goal Status: Active Signs and symptoms of seizure will be recognized and promptly addressed ; seizing patients will suffer no harm Date Initiated: 06/05/2021 T arget Resolution Date: 06/05/2021 Goal Status: Active Interventions: Administer a five (5) minute air break for patient if signs and symptoms of seizure appear and notify the hyperbaric physician Administer decongestants, per physician orders, prior to HBO2 Administer the correct therapeutic gas delivery based on the patients needs and limitations, per physician order Assess and provide for patients comfort related to the hyperbaric environment and equalization of middle ear Assess for signs and symptoms related to adverse events, including but not limited to confinement anxiety, pneumothorax, oxygen toxicity and baurotrauma Assess patient for any history of confinement anxiety Assess patient's knowledge and expectations regarding hyperbaric medicine and provide education related to the hyperbaric environment, goals of treatment and prevention of adverse events Implement protocols to decrease risk of pneumothorax in high risk patients Notes: Osteomyelitis Nursing Diagnoses: Infection: osteomyelitis Knowledge deficit related to disease process and management Potential for infection: osteomyelitis Goals: Diagnostic evaluation for osteomyelitis completed as  ordered Date Initiated: 06/05/2021 Target Resolution Date: 06/05/2021 Goal Status: Active Patient/caregiver will verbalize understanding of disease process and disease management Date Initiated: 06/05/2021 Target Resolution Date: 06/05/2021 Goal Status: Active [name] osteomyelitis will resolve Date Initiated: 06/05/2021 Target Resolution Date: 06/05/2021 Goal Status: Active Signs and symptoms for osteomyelitis will be recognized and promptly addressed Date Initiated: 06/05/2021 Target Resolution Date: 06/05/2021 Goal Status: Active [name], [name] ([id number]) [id number] Page 6 of 10 Interventions: Assess for signs and symptoms of osteomyelitis resolution every visit Provide education on osteomyelitis Notes: Wound/Skin Impairment Nursing Diagnoses: Impaired tissue integrity Knowledge deficit related to ulceration/compromised skin integrity Goals: Ulcer/skin breakdown will have a volume reduction of 30% by week 4 Date Initiated: 05/18/2021 Target Resolution Date: 06/15/2021 Goal Status: Active Ulcer/skin breakdown will have a volume reduction of 50% by week 8 Date Initiated: 05/18/2021 Target Resolution Date: 07/13/2021 Goal Status: Active Ulcer/skin breakdown will have a volume reduction of 80% by week 12 Date Initiated: 05/18/2021 Target Resolution Date: 08/10/2021 Goal Status: Active Ulcer/skin breakdown will heal within 14 weeks Date Initiated: 05/18/2021 Target Resolution Date: 08/24/2021 Goal Status: Active Interventions: Assess patient/caregiver ability to obtain necessary supplies Assess patient/caregiver ability to perform ulcer/skin  care regimen upon admission and as needed Assess ulceration(s) every visit Provide education on ulcer and skin care Notes: Electronic Signature(s) Signed: 01/11/2022 12:54:23 PM By: Massie Kluver Signed: 01/14/2022 1:46:28 PM By: Gretta Cool, BSN, RN, CWS, Kim RN, BSN Entered By: Massie Kluver on 01/11/2022  10:58:59 -------------------------------------------------------------------------------- Pain Assessment Details Patient Name: Date of Service: [name], [name] 01/11/2022 10:30 A M Medical Record Number: [id number] Patient Account Number: [id number] Date of Birth/Sex: Treating RN: 22-Aug-1970 (51 y.o. [name] Primary Care Lashona Schaaf: Evern Bio Other Clinician: Massie Kluver Referring Lauran Romanski: Treating Willim Turnage/Extender: Darien Ramus Weeks in Treatment: 56 Active Problems Location of Pain Severity and Description of Pain Patient Has Paino Yes Site Locations Pain Location: [place], [name] ([id number]) [id number] Page 7 of 10 Pain Location: Generalized Pain, Pain in Ulcers Duration of the Pain. Constant / Intermittento Intermittent Rate the pain. Current Pain Level: 2 Worst Pain Level: 5 Least Pain Level: 0 Tolerable Pain Level: 2 Character of Pain Describe the Pain: Sharp, Stabbing Pain Management and Medication Current Pain Management: Medication: Yes Cold Application: No Rest: Yes Massage: No Activity: No T.E.N.S.: No Heat Application: No Leg drop or elevation: No Is the Current Pain Management Adequate: Inadequate How does your wound impact your activities of daily livingo Sleep: No Bathing: No Appetite: No Relationship With Others: No Bladder Continence: No Emotions: No Bowel Continence: No Work: No Toileting: No Drive: No Dressing: No Hobbies: No Electronic Signature(s) Signed: 01/11/2022 12:54:23 PM By: Massie Kluver Signed: 01/14/2022 1:46:28 PM By: Gretta Cool, BSN, RN, CWS, Kim RN, BSN Entered By: Massie Kluver on 01/11/2022 10:49:33 -------------------------------------------------------------------------------- Patient/Caregiver Education Details Patient Name: Date of Service: [name], [name] 11/17/2023andnbsp10:30 A M Medical Record Number: [id number] Patient Account Number: [id number] Date  of Birth/Gender: Treating RN: Jan 26, 1971 (51 y.o. [name] Primary Care Physician: Evern Bio Other Clinician: Massie Kluver Referring Physician: Treating Physician/Extender: Darien Ramus Weeks in Treatment: 42 Education Assessment Education Provided To: Patient Education Topics Provided Wound/Skin Impairment: Handouts: Other: continue wound care as directed [place], [name] ([id number]) [id number] Page 8 of 10 Methods: Explain/Verbal Responses: State content correctly Electronic Signature(s) Signed: 01/11/2022 12:54:23 PM By: Massie Kluver Entered By: Massie Kluver on 01/11/2022 12:29:41 -------------------------------------------------------------------------------- Wound Assessment Details Patient Name: Date of Service: [name], [name] 01/11/2022 10:30 A M Medical Record Number: [id number] Patient Account Number: [id number] Date of Birth/Sex: Treating RN: [birth year] (51 y.o. [name] Primary Care Tammy Wickliffe: Evern Bio Other Clinician: Massie Kluver Referring Laron Boorman: Treating Trystian Crisanto/Extender: Darien Ramus Weeks in Treatment: 34 Wound Status Wound Number: 2 Primary Diabetic Wound/Ulcer of the Lower Extremity Etiology: Wound Location: Right, Circumferential Foot Wound Open Wounding Event: Gradually Appeared Status: Date Acquired: 10/15/2020 Notes: open areas to midline right foot, lateral right foot, and top of right Weeks Of Treatment: 34 foot Clustered Wound: No Comorbid Cataracts, Anemia, Asthma, Chronic Obstructive Pulmonary Pending Amputation On Presentation History: Disease (COPD), Congestive Heart Failure, Hypertension, Type II Diabetes, End Stage Renal Disease, Osteoarthritis, Neuropathy Photos Wound Measurements Length: (cm) 10.5 Width: (cm) 7.5 Depth: (cm) 0.2 Area: (cm) 61.85 Volume: (cm) 12.37 % Reduction in Area: 41.7% % Reduction in Volume: 94.2% Tunneling:  No Undermining: No Wound Description Classification: Grade 4 Exudate Amount: Medium Exudate Type: Serosanguineous Exudate Color: red, brown Foul Odor After Cleansing: No Slough/Fibrino Yes Wound Bed Granulation Amount: Medium (34-66%) Exposed Structure Necrotic Amount: Medium (34-66%) Fascia Exposed: No Necrotic Quality: Eschar Fat Layer (Subcutaneous Tissue) Exposed: Yes Tendon Exposed: No [name], [name] M (  [id number]) [id number] Page 9 of 10 Muscle Exposed: No Joint Exposed: No Bone Exposed: No Treatment Notes Wound #2 (Foot) Wound Laterality: Right, Circumferential Cleanser Dakin 16 (oz) 0.25 Discharge Instruction: apply wet/dry dressing over top of oil emersion dressing Normal Saline Discharge Instruction: Wash your hands with soap and water. Remove old dressing, discard into plastic bag and place into trash. Cleanse the wound with Normal Saline prior to applying a clean dressing using gauze sponges, not tissues or cotton balls. Do not scrub or use excessive force. Pat dry using gauze sponges, not tissue or cotton balls. Soap and Water Discharge Instruction: Gently cleanse wound with antibacterial soap, rinse and pat dry prior to dressing wounds Peri-Wound Care Topical Betadine Discharge Instruction: Apply betadine as directed. apply to gauze and pack sites and lay op open areas to top of foot Primary Dressing Gauze Discharge Instruction: As directed: betadine applied Curad Oil Emulsion Gauze Dressing, 3x8 (in/in) Discharge Instruction: apply to pink areas of foot Secondary Dressing ABD Pad 5x9 (in/in) Discharge Instruction: Cover with ABD pad Secured With [facility] T ape ape, 2x2 (in/yd) Kerlix Roll Sterile or Non-Sterile 6-ply 4.5x4 (yd/yd) Discharge Instruction: Apply Kerlix as directed Stretch Net Dressing, Latex-free, Size 5, Small-Head / Shoulder / Thigh Compression Wrap Compression  Stockings Add-Ons Electronic Signature(s) Signed: 01/11/2022 12:54:23 PM By: Massie Kluver Signed: 01/14/2022 1:46:28 PM By: Gretta Cool, BSN, RN, CWS, Kim RN, BSN Entered By: Massie Kluver on 01/11/2022 10:58:25 -------------------------------------------------------------------------------- Vitals Details Patient Name: Date of Service: [name], [name] 01/11/2022 10:30 A M Medical Record Number: [id number] Patient Account Number: [id number] Date of Birth/Sex: Treating RN: [birth year] (51 y.o. [name] Primary Care Tava Peery: Evern Bio Other Clinician: Massie Kluver Referring Ronda Kazmi: Treating Tanetta Fuhriman/Extender: Darien Ramus Weeks in Treatment: 66 Vital Signs Pulis, [name] ([id number]) [id number] Page 10 of 10 Time Taken: 10:46 Temperature (F): 99.1 Pulse (bpm): 91 Respiratory Rate (breaths/min): 18 Blood Pressure (mmHg): 122/72 Reference Range: 80 - 120 mg / dl Electronic Signature(s) Signed: 01/11/2022 12:54:23 PM By: Massie Kluver Entered By: Massie Kluver on 01/11/2022 10:48:25

## 2022-01-15 ENCOUNTER — Ambulatory Visit: Payer: Medicare Other | Attending: Otolaryngology

## 2022-01-15 DIAGNOSIS — G4761 Periodic limb movement disorder: Secondary | ICD-10-CM | POA: Diagnosis not present

## 2022-01-15 DIAGNOSIS — I110 Hypertensive heart disease with heart failure: Secondary | ICD-10-CM | POA: Diagnosis not present

## 2022-01-15 DIAGNOSIS — G4733 Obstructive sleep apnea (adult) (pediatric): Secondary | ICD-10-CM | POA: Insufficient documentation

## 2022-01-15 DIAGNOSIS — E669 Obesity, unspecified: Secondary | ICD-10-CM | POA: Diagnosis not present

## 2022-01-15 DIAGNOSIS — E8779 Other fluid overload: Secondary | ICD-10-CM | POA: Diagnosis not present

## 2022-01-15 DIAGNOSIS — I5032 Chronic diastolic (congestive) heart failure: Secondary | ICD-10-CM | POA: Diagnosis not present

## 2022-01-15 DIAGNOSIS — Z992 Dependence on renal dialysis: Secondary | ICD-10-CM | POA: Diagnosis not present

## 2022-01-15 DIAGNOSIS — R0683 Snoring: Secondary | ICD-10-CM | POA: Diagnosis not present

## 2022-01-15 DIAGNOSIS — N186 End stage renal disease: Secondary | ICD-10-CM | POA: Diagnosis not present

## 2022-01-16 DIAGNOSIS — N186 End stage renal disease: Secondary | ICD-10-CM | POA: Diagnosis not present

## 2022-01-16 DIAGNOSIS — E8779 Other fluid overload: Secondary | ICD-10-CM | POA: Diagnosis not present

## 2022-01-16 DIAGNOSIS — Z992 Dependence on renal dialysis: Secondary | ICD-10-CM | POA: Diagnosis not present

## 2022-01-18 DIAGNOSIS — Z992 Dependence on renal dialysis: Secondary | ICD-10-CM | POA: Diagnosis not present

## 2022-01-18 DIAGNOSIS — I132 Hypertensive heart and chronic kidney disease with heart failure and with stage 5 chronic kidney disease, or end stage renal disease: Secondary | ICD-10-CM | POA: Diagnosis not present

## 2022-01-18 DIAGNOSIS — E1122 Type 2 diabetes mellitus with diabetic chronic kidney disease: Secondary | ICD-10-CM | POA: Diagnosis not present

## 2022-01-18 DIAGNOSIS — L97513 Non-pressure chronic ulcer of other part of right foot with necrosis of muscle: Secondary | ICD-10-CM | POA: Diagnosis not present

## 2022-01-18 DIAGNOSIS — J9611 Chronic respiratory failure with hypoxia: Secondary | ICD-10-CM | POA: Diagnosis not present

## 2022-01-18 DIAGNOSIS — E1151 Type 2 diabetes mellitus with diabetic peripheral angiopathy without gangrene: Secondary | ICD-10-CM | POA: Diagnosis not present

## 2022-01-18 DIAGNOSIS — Z79891 Long term (current) use of opiate analgesic: Secondary | ICD-10-CM | POA: Diagnosis not present

## 2022-01-18 DIAGNOSIS — N186 End stage renal disease: Secondary | ICD-10-CM | POA: Diagnosis not present

## 2022-01-18 DIAGNOSIS — E11621 Type 2 diabetes mellitus with foot ulcer: Secondary | ICD-10-CM | POA: Diagnosis not present

## 2022-01-18 DIAGNOSIS — L089 Local infection of the skin and subcutaneous tissue, unspecified: Secondary | ICD-10-CM | POA: Diagnosis not present

## 2022-01-18 DIAGNOSIS — J449 Chronic obstructive pulmonary disease, unspecified: Secondary | ICD-10-CM | POA: Diagnosis not present

## 2022-01-18 DIAGNOSIS — Z7952 Long term (current) use of systemic steroids: Secondary | ICD-10-CM | POA: Diagnosis not present

## 2022-01-18 DIAGNOSIS — Z7951 Long term (current) use of inhaled steroids: Secondary | ICD-10-CM | POA: Diagnosis not present

## 2022-01-18 DIAGNOSIS — Z794 Long term (current) use of insulin: Secondary | ICD-10-CM | POA: Diagnosis not present

## 2022-01-18 DIAGNOSIS — I5032 Chronic diastolic (congestive) heart failure: Secondary | ICD-10-CM | POA: Diagnosis not present

## 2022-01-18 DIAGNOSIS — Z792 Long term (current) use of antibiotics: Secondary | ICD-10-CM | POA: Diagnosis not present

## 2022-01-18 DIAGNOSIS — D631 Anemia in chronic kidney disease: Secondary | ICD-10-CM | POA: Diagnosis not present

## 2022-01-18 DIAGNOSIS — Z9981 Dependence on supplemental oxygen: Secondary | ICD-10-CM | POA: Diagnosis not present

## 2022-01-19 DIAGNOSIS — E8779 Other fluid overload: Secondary | ICD-10-CM | POA: Diagnosis not present

## 2022-01-19 DIAGNOSIS — N186 End stage renal disease: Secondary | ICD-10-CM | POA: Diagnosis not present

## 2022-01-19 DIAGNOSIS — Z992 Dependence on renal dialysis: Secondary | ICD-10-CM | POA: Diagnosis not present

## 2022-01-21 DIAGNOSIS — L089 Local infection of the skin and subcutaneous tissue, unspecified: Secondary | ICD-10-CM | POA: Diagnosis not present

## 2022-01-21 DIAGNOSIS — E11621 Type 2 diabetes mellitus with foot ulcer: Secondary | ICD-10-CM | POA: Diagnosis not present

## 2022-01-21 DIAGNOSIS — Z992 Dependence on renal dialysis: Secondary | ICD-10-CM | POA: Diagnosis not present

## 2022-01-21 DIAGNOSIS — Z7952 Long term (current) use of systemic steroids: Secondary | ICD-10-CM | POA: Diagnosis not present

## 2022-01-21 DIAGNOSIS — I132 Hypertensive heart and chronic kidney disease with heart failure and with stage 5 chronic kidney disease, or end stage renal disease: Secondary | ICD-10-CM | POA: Diagnosis not present

## 2022-01-21 DIAGNOSIS — Z792 Long term (current) use of antibiotics: Secondary | ICD-10-CM | POA: Diagnosis not present

## 2022-01-21 DIAGNOSIS — E1151 Type 2 diabetes mellitus with diabetic peripheral angiopathy without gangrene: Secondary | ICD-10-CM | POA: Diagnosis not present

## 2022-01-21 DIAGNOSIS — I5032 Chronic diastolic (congestive) heart failure: Secondary | ICD-10-CM | POA: Diagnosis not present

## 2022-01-21 DIAGNOSIS — E1122 Type 2 diabetes mellitus with diabetic chronic kidney disease: Secondary | ICD-10-CM | POA: Diagnosis not present

## 2022-01-21 DIAGNOSIS — Z9981 Dependence on supplemental oxygen: Secondary | ICD-10-CM | POA: Diagnosis not present

## 2022-01-21 DIAGNOSIS — Z79891 Long term (current) use of opiate analgesic: Secondary | ICD-10-CM | POA: Diagnosis not present

## 2022-01-21 DIAGNOSIS — L97513 Non-pressure chronic ulcer of other part of right foot with necrosis of muscle: Secondary | ICD-10-CM | POA: Diagnosis not present

## 2022-01-21 DIAGNOSIS — Z7951 Long term (current) use of inhaled steroids: Secondary | ICD-10-CM | POA: Diagnosis not present

## 2022-01-21 DIAGNOSIS — N186 End stage renal disease: Secondary | ICD-10-CM | POA: Diagnosis not present

## 2022-01-21 DIAGNOSIS — D631 Anemia in chronic kidney disease: Secondary | ICD-10-CM | POA: Diagnosis not present

## 2022-01-21 DIAGNOSIS — Z794 Long term (current) use of insulin: Secondary | ICD-10-CM | POA: Diagnosis not present

## 2022-01-22 DIAGNOSIS — N186 End stage renal disease: Secondary | ICD-10-CM | POA: Diagnosis not present

## 2022-01-22 DIAGNOSIS — J449 Chronic obstructive pulmonary disease, unspecified: Secondary | ICD-10-CM | POA: Diagnosis not present

## 2022-01-22 DIAGNOSIS — E8779 Other fluid overload: Secondary | ICD-10-CM | POA: Diagnosis not present

## 2022-01-22 DIAGNOSIS — Z992 Dependence on renal dialysis: Secondary | ICD-10-CM | POA: Diagnosis not present

## 2022-01-23 DIAGNOSIS — E8779 Other fluid overload: Secondary | ICD-10-CM | POA: Diagnosis not present

## 2022-01-23 DIAGNOSIS — Z992 Dependence on renal dialysis: Secondary | ICD-10-CM | POA: Diagnosis not present

## 2022-01-23 DIAGNOSIS — N186 End stage renal disease: Secondary | ICD-10-CM | POA: Diagnosis not present

## 2022-01-24 DIAGNOSIS — Z992 Dependence on renal dialysis: Secondary | ICD-10-CM | POA: Diagnosis not present

## 2022-01-24 DIAGNOSIS — E8779 Other fluid overload: Secondary | ICD-10-CM | POA: Diagnosis not present

## 2022-01-24 DIAGNOSIS — N186 End stage renal disease: Secondary | ICD-10-CM | POA: Diagnosis not present

## 2022-01-25 ENCOUNTER — Ambulatory Visit: Payer: Medicare Other | Admitting: Physician Assistant

## 2022-01-25 ENCOUNTER — Ambulatory Visit: Payer: Medicare Other | Admitting: Cardiovascular Disease

## 2022-01-25 DIAGNOSIS — Z992 Dependence on renal dialysis: Secondary | ICD-10-CM | POA: Diagnosis not present

## 2022-01-25 DIAGNOSIS — I5040 Unspecified combined systolic (congestive) and diastolic (congestive) heart failure: Secondary | ICD-10-CM | POA: Diagnosis not present

## 2022-01-25 DIAGNOSIS — N186 End stage renal disease: Secondary | ICD-10-CM | POA: Diagnosis not present

## 2022-01-25 DIAGNOSIS — J45909 Unspecified asthma, uncomplicated: Secondary | ICD-10-CM | POA: Diagnosis not present

## 2022-01-28 DIAGNOSIS — Z792 Long term (current) use of antibiotics: Secondary | ICD-10-CM | POA: Diagnosis not present

## 2022-01-28 DIAGNOSIS — Z794 Long term (current) use of insulin: Secondary | ICD-10-CM | POA: Diagnosis not present

## 2022-01-28 DIAGNOSIS — L97513 Non-pressure chronic ulcer of other part of right foot with necrosis of muscle: Secondary | ICD-10-CM | POA: Diagnosis not present

## 2022-01-28 DIAGNOSIS — L089 Local infection of the skin and subcutaneous tissue, unspecified: Secondary | ICD-10-CM | POA: Diagnosis not present

## 2022-01-28 DIAGNOSIS — I132 Hypertensive heart and chronic kidney disease with heart failure and with stage 5 chronic kidney disease, or end stage renal disease: Secondary | ICD-10-CM | POA: Diagnosis not present

## 2022-01-28 DIAGNOSIS — E1151 Type 2 diabetes mellitus with diabetic peripheral angiopathy without gangrene: Secondary | ICD-10-CM | POA: Diagnosis not present

## 2022-01-28 DIAGNOSIS — I5032 Chronic diastolic (congestive) heart failure: Secondary | ICD-10-CM | POA: Diagnosis not present

## 2022-01-28 DIAGNOSIS — Z7952 Long term (current) use of systemic steroids: Secondary | ICD-10-CM | POA: Diagnosis not present

## 2022-01-28 DIAGNOSIS — Z79891 Long term (current) use of opiate analgesic: Secondary | ICD-10-CM | POA: Diagnosis not present

## 2022-01-28 DIAGNOSIS — N186 End stage renal disease: Secondary | ICD-10-CM | POA: Diagnosis not present

## 2022-01-28 DIAGNOSIS — Z9981 Dependence on supplemental oxygen: Secondary | ICD-10-CM | POA: Diagnosis not present

## 2022-01-28 DIAGNOSIS — D631 Anemia in chronic kidney disease: Secondary | ICD-10-CM | POA: Diagnosis not present

## 2022-01-28 DIAGNOSIS — E11621 Type 2 diabetes mellitus with foot ulcer: Secondary | ICD-10-CM | POA: Diagnosis not present

## 2022-01-28 DIAGNOSIS — E1122 Type 2 diabetes mellitus with diabetic chronic kidney disease: Secondary | ICD-10-CM | POA: Diagnosis not present

## 2022-01-28 DIAGNOSIS — Z992 Dependence on renal dialysis: Secondary | ICD-10-CM | POA: Diagnosis not present

## 2022-01-28 DIAGNOSIS — Z7951 Long term (current) use of inhaled steroids: Secondary | ICD-10-CM | POA: Diagnosis not present

## 2022-01-29 DIAGNOSIS — N186 End stage renal disease: Secondary | ICD-10-CM | POA: Diagnosis not present

## 2022-01-29 DIAGNOSIS — Z992 Dependence on renal dialysis: Secondary | ICD-10-CM | POA: Diagnosis not present

## 2022-01-31 DIAGNOSIS — Z992 Dependence on renal dialysis: Secondary | ICD-10-CM | POA: Diagnosis not present

## 2022-01-31 DIAGNOSIS — N186 End stage renal disease: Secondary | ICD-10-CM | POA: Diagnosis not present

## 2022-02-01 ENCOUNTER — Encounter: Payer: Medicare Other | Attending: Physician Assistant | Admitting: Physician Assistant

## 2022-02-01 DIAGNOSIS — I5042 Chronic combined systolic (congestive) and diastolic (congestive) heart failure: Secondary | ICD-10-CM | POA: Diagnosis not present

## 2022-02-01 DIAGNOSIS — Z992 Dependence on renal dialysis: Secondary | ICD-10-CM | POA: Diagnosis not present

## 2022-02-01 DIAGNOSIS — I132 Hypertensive heart and chronic kidney disease with heart failure and with stage 5 chronic kidney disease, or end stage renal disease: Secondary | ICD-10-CM | POA: Insufficient documentation

## 2022-02-01 DIAGNOSIS — E1122 Type 2 diabetes mellitus with diabetic chronic kidney disease: Secondary | ICD-10-CM | POA: Diagnosis not present

## 2022-02-01 DIAGNOSIS — N186 End stage renal disease: Secondary | ICD-10-CM | POA: Diagnosis not present

## 2022-02-01 DIAGNOSIS — E1151 Type 2 diabetes mellitus with diabetic peripheral angiopathy without gangrene: Secondary | ICD-10-CM | POA: Diagnosis not present

## 2022-02-01 DIAGNOSIS — E11621 Type 2 diabetes mellitus with foot ulcer: Secondary | ICD-10-CM | POA: Insufficient documentation

## 2022-02-01 DIAGNOSIS — M1990 Unspecified osteoarthritis, unspecified site: Secondary | ICD-10-CM | POA: Diagnosis not present

## 2022-02-01 DIAGNOSIS — L97514 Non-pressure chronic ulcer of other part of right foot with necrosis of bone: Secondary | ICD-10-CM | POA: Diagnosis not present

## 2022-02-01 DIAGNOSIS — E1140 Type 2 diabetes mellitus with diabetic neuropathy, unspecified: Secondary | ICD-10-CM | POA: Diagnosis not present

## 2022-02-01 NOTE — Progress Notes (Signed)
[place], [name] ([id number]) [id number] Page 1 of 9 Visit Report for 02/01/2022 Chief Complaint Document Details Patient Name: Date of Service: [name], [name] [name] M. 02/01/2022 9:30 A M Medical Record Number: [id number] Patient Account Number: [id number] Date of Birth/Sex: Treating RN: [birth year] (51 y.o. [name] Primary Care Provider: Evern Bio Other Clinician: Massie Kluver Referring Provider: Treating Provider/Extender: Darien Ramus Weeks in Treatment: 37 Information Obtained from: Patient Chief Complaint Right foot gangrene and osteomyelitis Electronic Signature(s) Signed: 02/01/2022 9:37:07 AM By: Worthy Keeler PA-C Entered By: Worthy Keeler on 02/01/2022 09:37:06 -------------------------------------------------------------------------------- Debridement Details Patient Name: Date of Service: [name], [name] [name] M. 02/01/2022 9:30 A M Medical Record Number: [id number] Patient Account Number: [id number] Date of Birth/Sex: Treating RN: 03-03-1970 (51 y.o. [name] Primary Care Provider: Evern Bio Other Clinician: Massie Kluver Referring Provider: Treating Provider/Extender: Darien Ramus Weeks in Treatment: 37 Debridement Performed for Assessment: Wound #2 Right,Circumferential Foot Performed By: Physician Tommie Sams., PA-C Debridement Type: Debridement Severity of Tissue Pre Debridement: Necrosis of bone Level of Consciousness (Pre-procedure): Awake and Alert Pre-procedure Verification/Time Out Yes - 09:52 Taken: Start Time: 09:52 T Area Debrided (L x W): otal 10 (cm) x 7 (cm) = 70 (cm) Tissue and other material debrided: Viable, Non-Viable, Callus, Slough, Subcutaneous, Slough Level: Skin/Subcutaneous Tissue Debridement Description: Excisional Instrument: Curette Bleeding: Minimum Hemostasis Achieved: Pressure Response to Treatment: Procedure was tolerated well Level of Consciousness  (Post- Awake and Alert procedure): [name], [name] [id number] Page 2 of 9 Post Debridement Measurements of Total Wound Length: (cm) 9 Width: (cm) 6.8 Depth: (cm) 0.2 Volume: (cm) 9.613 Character of Wound/Ulcer Post Debridement: Stable Severity of Tissue Post Debridement: Necrosis of bone Post Procedure Diagnosis Same as Pre-procedure Electronic Signature(s) Signed: 02/01/2022 10:21:17 AM By: Massie Kluver Signed: 02/01/2022 11:32:10 AM By: Gretta Cool, BSN, RN, CWS, Kim RN, BSN Signed: 02/01/2022 12:49:38 PM By: Worthy Keeler PA-C Entered By: Massie Kluver on 02/01/2022 09:59:22 -------------------------------------------------------------------------------- HPI Details Patient Name: Date of Service: [name], [name] [name] M. 02/01/2022 9:30 A M Medical Record Number: [id number] Patient Account Number: [id number] Date of Birth/Sex: Treating RN: [birth year] (51 y.o. [name] Primary Care Provider: Evern Bio Other Clinician: Massie Kluver Referring Provider: Treating Provider/Extender: Darien Ramus Weeks in Treatment: 37 History of Present Illness HPI Description: 10/04/2020 upon evaluation today patient presents for initial inspection here in the clinic concerning a fairly concerning wound over her right fifth toe towards the medial aspect which actually probes all the way down to bone. There is evidence of necrotic muscle as well and I do see tendon exposed as well. Subsequently this does have me very concerned about where things stand here. Again this is the first time that I am seeing her and on top of everything else her ABIs are noncompressible. I think that arterial flow is the primary concern here. Of note the patient states this was first noted July 1. She had a x-ray on July 4 which was negative for bone infection. She has been on 2 antibiotics she is unsure of the name. With that being said her most recent hemoglobin A1c  was 8.1 that was December 2022. She otherwise does have a history obviously of diabetes mellitus type 2, hypertension, end-stage renal disease with dependence on renal dialysis, and she is also dependent on supplemental oxygen. Readmission: 05/14/2021 this is a patient whom I previously seen October 04, 2020. With that being said she is subsequently on 21 October 2020  ended up having an amputation of her fifth digit of her foot unfortunately. This had progressed fairly quickly into what sounds to been gangrene at that time. Subsequently right now based on what I am seeing the patient actually has a necrotic area on the end of her foot and has been recommended that the only option really here is going to be a amputation which would be a below-knee amputation. She is really not interested in that and wants to try to give this every chance it can to heal. For that reason I discussed with her that definitely we can see what we can do to try to improve this situation and try to help get this doing better as best we can. With that being said there is definitely a portion of her wound that is just not good to be able to heal no matter what it is already starting to auto amputate. This includes potentially digits of her foot as well based on what I am seeing. She does have confirmed osteomyelitis noted by MRI. She is also currently on IV vancomycin. She does undergo dialysis on Tuesday, Thursday, and Saturday. The patient does have a history of diabetes mellitus type 2, congestive heart failure, hypertension, end-stage renal disease, dependence on renal dialysis, and dependence on supplemental oxygen. With that being said I am going to need to look into her heart failure and the significance of this as far as hyperbarics is concerned before we initiate treatment. She is probably also going to need an updated EKG and chest x-ray unless its already been done recently. 05/28/2021 upon evaluation today patient appears  to be doing well with regard to her wound all things considered. I am going to try to see what we can do about trimmed away some of the necrotic tissue that is really just hanging on here and there and not really helpful. Feel like if we can slowly start doing this maybe we will be able to get to the point where this is better and better as far as cleaning up the surface of the wound is concerned. Still as I explained I do believe some of her toes are not salvageable but again she is looking more towards autoamputation versus want to do anything definitive here from a surgical standpoint. I have been researching whether she would be an okay candidate for hyperbarics or not and I found that her x-ray but she had a chest x-ray recently was negative for any acute disease and appear to be stable. EKG was also stable. She did also have a cardiac echo which showed that her ejection fraction was 60 to 65% and therefore I think she is a candidate for hyperbarics. Again I do believe that this can help with preventing amputation which right now she has been told that her only option would be a below-knee amputation. Nonetheless I think that if we can get the heel leading to spread and the majority of this cleared away that it is possible we might even be able to get a surgeon just to surgically remove the nonviable toes and not have to go the full way of complete amputation. Nonetheless we do have a ways to go before we will get to that point. Either way I think the hyperbarics could be an excellent support as far as that is concerned. 06-05-2021 upon evaluation today patient's wound is showing some signs of improvement. This does seem to be slow and of course it is understandable considering what we  are seeing. She has discussed and looked up information about hyperbarics and in the end comes back today and states that she does [place], [name] ([id number]) [id number] Page 3 of 9 want  to proceed. I honestly do feel like that is her best chance at saving the foot. Even if we are able to get her into a surgeon after we obtain some good healing approximately so that there is not as much necrotic tissue in general she may even be able to have a transmetatarsal amputation and get this to heal. Again I am not saying this is can be an easy road but it may be her best road as far as thinking about how to achieve some type of complete healing as I do not believe the toes are viable at all to be honest. 07-06-2021 upon evaluation today patient appears to be doing well currently in regard to her foot all things considered. Again she unfortunately was unable to do the hyperbaric oxygen therapy this was due to her claustrophobia. Nonetheless she tells me that she is pleased with how the foot appears and she does feel like there is good improvement going on here. In general I think that we are on the right track. If she is going to take a very long time and to be honest the route of autoamputation which is pretty much the way this is going is not a quick or easy road but nonetheless she seems to be taking care of her foot quite well. She is in good spirits. 07-27-2021 upon evaluation today patient appears to be doing well currently in regard to her foot all things considered. A lot of the eschar is starting to lift up around the edges which is good there is still significant necrotic tissue on the distal portion of the foot. This is slowly going to work its way off. Based on what I am seeing currently I do not believe that there is any evidence of infection which is good news. 08-31-2021 upon evaluation today patient's wound actually appears to be doing decently well. I do not see anything in particular to try to trim away today. With that being said she continues to slowly allow this to do what is going to do as far as autoamputation is concerned. I did have another conversation with her today about  the possibility of seeing a surgeon for a transmetatarsal amputation which again would help to get this to close faster and obviously I think would be a much speedy year and safer way to proceed with amputation. With that being said she tells me that she would really prefer to just let this go at "God pace". 09-28-2021 upon evaluation today patient's foot actually appears to be doing much better. Fortunately I do not see any signs of infection which is great news and overall I am extremely pleased with where we stand today. There does not appear to be any signs of active infection locally or systemically which is great news. No fevers, chills, nausea, vomiting, or diarrhea. 10-26-2021 upon evaluation today patient appears to be doing well currently in regard to her foot all things considered. Fortunately I do not see any evidence of active infection locally or systemically at this time which is great news. No fevers, chills, nausea, vomiting, or diarrhea. She does have some of the eschar that is lifting up even a little bit more compared to last time I saw her. 11-30-2021 upon evaluation today patient's wound is still continuing to  have some areas that lift up although there is nothing really that I feel like I could trim away too much today to be honest. Fortunately I do not see any evidence of systemic infection though locally she has been concerned about some drainage I do not see anything that seems to be actively red or erythematous or warm to touch which is good news. No fevers, chills, nausea, vomiting, or diarrhea. 12-21-2021 upon evaluation patient's foot ulcer actually is showing signs of some improvement with healthier tissue noted at areas with eschar that keeps clearing off. There is a bit of the eschar that is loosening up today as well and I think we will get a be able to get some of this cleared away that we have been working on for a while. Fortunately I do not see any evidence of infection  locally or systemically at this time which is great news. No fevers, chills, nausea, vomiting, or diarrhea. 01-11-2022 upon evaluation today patient appears to be doing decently well in regard to her foot she still has a necrotic area on the tip of her foot but she is tolerating the dressing changes without complication. Fortunately there does not appear to be any signs of infection locally or systemically at this time which is great news. No fevers, chills, nausea, vomiting, or diarrhea. 02-01-2022 upon evaluation today patient continues to show signs of improvement in regard to the area where we have been able to remove the eschar. Unfortunately the tip of the foot I think is the part that is completely dead and not going to be coming back as far as any healing is concerned. I have discussed this with the patient multiple times however she does not want to have any type of surgical intervention. Obviously in that regard we will try to do what we can as best we can. Electronic Signature(s) Signed: 02/01/2022 10:07:02 AM By: Worthy Keeler PA-C Entered By: Worthy Keeler on 02/01/2022 10:07:02 -------------------------------------------------------------------------------- Physical Exam Details Patient Name: Date of Service: [name], [name] [name] M. 02/01/2022 9:30 A M Medical Record Number: [id number] Patient Account Number: [id number] Date of Birth/Sex: Treating RN: [birth year] (51 y.o. [name] Primary Care Provider: Evern Bio Other Clinician: Massie Kluver Referring Provider: Treating Provider/Extender: Darien Ramus Weeks in Treatment: 37 Psychiatric this patient is able to make decisions and demonstrates good insight into disease process. Alert and Oriented x 3. pleasant and cooperative. Notes Upon inspection patient showed signs of some good granulation and epithelization. Again the foot from the central part headed towards the end seems to be healing decently well  but at the same time unfortunately the end of the foot just does not go to recover this is completely eschared and dry gangrene. Electronic Signature(s) [name], [name] ([id number]) [id number] Page 4 of 9 Signed: 02/01/2022 10:07:52 AM By: Worthy Keeler PA-C Entered By: Worthy Keeler on 02/01/2022 10:07:52 -------------------------------------------------------------------------------- Physician Orders Details Patient Name: Date of Service: [name], [name] [name] M. 02/01/2022 9:30 A M Medical Record Number: [id number] Patient Account Number: [id number] Date of Birth/Sex: Treating RN: 17-Apr-1970 (51 y.o. [name] Primary Care Provider: Evern Bio Other Clinician: Massie Kluver Referring Provider: Treating Provider/Extender: Darien Ramus Weeks in Treatment: 24 Verbal / Phone Orders: No Diagnosis Coding ICD-10 Coding Code Description M86.371 Chronic multifocal osteomyelitis, right ankle and foot E11.621 Type 2 diabetes mellitus with foot ulcer L97.514 Non-pressure chronic ulcer of other part of right foot with necrosis of bone I50.42 Chronic  combined systolic (congestive) and diastolic (congestive) heart failure I10 Essential (primary) hypertension N18.6 End stage renal disease Z99.2 Dependence on renal dialysis Z99.81 Dependence on supplemental oxygen Follow-up Appointments Return Appointment in 3 weeks. Nurse Visit as needed [place] for wound care. May utilize formulary equivalent dressing for wound treatment orders unless otherwise specified. Home Health Nurse may visit [facility] to address patients wound care needs. Scheduled days for dressing changes to be completed; exception, patient has scheduled wound care visit that day. **Please direct any NON-WOUND related issues/requests for orders to patient's Primary Care Physician. **If current dressing causes regression in wound  condition, may D/C ordered dressing product/s and apply Normal Saline Moist Dressing daily until next [facility] or Other MD appointment. **Notify [facility] of regression in wound condition at [phone]. Bathing/ Shower/ Hygiene Clean wound with Normal Saline or wound cleanser. Wash wounds with antibacterial soap and water. May shower; gently cleanse wound with antibacterial soap, rinse and pat dry prior to dressing wounds No tub bath. Off-Loading Open toe surgical shoe Wound Treatment Wound #2 - Foot Wound Laterality: Right, Circumferential Cleanser: Normal Saline 1 x Per Day/30 Days Discharge Instructions: Wash your hands with soap and water. Remove old dressing, discard into plastic bag and place into trash. Cleanse the wound with Normal Saline prior to applying a clean dressing using gauze sponges, not tissues or cotton balls. Do not scrub or use excessive force. Pat dry using gauze sponges, not tissue or cotton balls. Cleanser: Soap and Water 1 x Per Day/30 Days Discharge Instructions: Gently cleanse wound with antibacterial soap, rinse and pat dry prior to dressing wounds Topical: Betadine 1 x Per Day/30 Days Discharge Instructions: apply gauze soaked dressing to top of foot and toes Prim Dressing: Gauze ary 1 x Per Day/30 Days Keziah, [name] ([id number]) [id number] Page 5 of 9 Discharge Instructions: As directed: betadine applied Prim Dressing: Prisma 4.34 (in) 1 x Per Day/30 Days ary Discharge Instructions: Moisten w/normal saline or sterile water; Cover open areas of wound as directed Do not remove from wound bed. Secondary Dressing: ABD Pad 5x9 (in/in) 1 x Per Day/30 Days Discharge Instructions: Cover with ABD pad Secured With: Medipore T - 72M Medipore H Soft Cloth Surgical T ape ape, 2x2 (in/yd) 1 x Per Day/30 Days Secured With: Kerlix Roll Sterile or Non-Sterile 6-ply 4.5x4 (yd/yd) 1 x Per Day/30 Days Discharge  Instructions: Apply Kerlix as directed Secured With: Stretch Net Dressing, Latex-free, Size 5, Small-Head / Shoulder / Thigh 1 x Per Day/30 Days Electronic Signature(s) Signed: 02/01/2022 10:21:17 AM By: Massie Kluver Signed: 02/01/2022 12:49:38 PM By: Worthy Keeler PA-C Entered By: Massie Kluver on 02/01/2022 10:20:10 -------------------------------------------------------------------------------- Problem List Details Patient Name: Date of Service: [name], [name] [name] M. 02/01/2022 9:30 A M Medical Record Number: [id number] Patient Account Number: [id number] Date of Birth/Sex: Treating RN: Jun 30, 1970 (51 y.o. [name] Primary Care Provider: Evern Bio Other Clinician: Massie Kluver Referring Provider: Treating Provider/Extender: Darien Ramus Weeks in Treatment: 37 Active Problems ICD-10 Encounter Code Description Active Date MDM Diagnosis M86.371 Chronic multifocal osteomyelitis, right ankle and foot 05/18/2021 No Yes E11.621 Type 2 diabetes mellitus with foot ulcer 05/18/2021 No Yes L97.514 Non-pressure chronic ulcer of other part of right foot with necrosis of bone 05/18/2021 No Yes I50.42 Chronic combined systolic (congestive) and diastolic (congestive) heart failure 05/18/2021 No Yes I10 Essential (primary) hypertension 05/18/2021 No Yes N18.6 End stage renal disease 05/18/2021 No Yes  Z99.2 Dependence on renal dialysis 05/18/2021 No Yes [place], [name] ([id number]) [id number] Page 6 of 9 Z99.81 Dependence on supplemental oxygen 05/18/2021 No Yes Inactive Problems Resolved Problems Electronic Signature(s) Signed: 02/01/2022 9:36:59 AM By: Worthy Keeler PA-C Entered By: Worthy Keeler on 02/01/2022 09:36:59 -------------------------------------------------------------------------------- Progress Note Details Patient Name: Date of Service: [name], [name] [name] M. 02/01/2022 9:30 A M Medical Record Number: [id number] Patient Account  Number: [id number] Date of Birth/Sex: Treating RN: 1970/07/11 (51 y.o. [name] Primary Care Provider: Evern Bio Other Clinician: Massie Kluver Referring Provider: Treating Provider/Extender: Darien Ramus Weeks in Treatment: 37 Subjective Chief Complaint Information obtained from Patient Right foot gangrene and osteomyelitis History of Present Illness (HPI) 10/04/2020 upon evaluation today patient presents for initial inspection here in the clinic concerning a fairly concerning wound over her right fifth toe towards the medial aspect which actually probes all the way down to bone. There is evidence of necrotic muscle as well and I do see tendon exposed as well. Subsequently this does have me very concerned about where things stand here. Again this is the first time that I am seeing her and on top of everything else her ABIs are noncompressible. I think that arterial flow is the primary concern here. Of note the patient states this was first noted July 1. She had a x-ray on July 4 which was negative for bone infection. She has been on 2 antibiotics she is unsure of the name. With that being said her most recent hemoglobin A1c was 8.1 that was December 2022. She otherwise does have a history obviously of diabetes mellitus type 2, hypertension, end-stage renal disease with dependence on renal dialysis, and she is also dependent on supplemental oxygen. Readmission: 05/14/2021 this is a patient whom I previously seen October 04, 2020. With that being said she is subsequently on 21 October 2020 ended up having an amputation of her fifth digit of her foot unfortunately. This had progressed fairly quickly into what sounds to been gangrene at that time. Subsequently right now based on what I am seeing the patient actually has a necrotic area on the end of her foot and has been recommended that the only option really here is going to be a amputation which would be a below-knee  amputation. She is really not interested in that and wants to try to give this every chance it can to heal. For that reason I discussed with her that definitely we can see what we can do to try to improve this situation and try to help get this doing better as best we can. With that being said there is definitely a portion of her wound that is just not good to be able to heal no matter what it is already starting to auto amputate. This includes potentially digits of her foot as well based on what I am seeing. She does have confirmed osteomyelitis noted by MRI. She is also currently on IV vancomycin. She does undergo dialysis on Tuesday, Thursday, and Saturday. The patient does have a history of diabetes mellitus type 2, congestive heart failure, hypertension, end-stage renal disease, dependence on renal dialysis, and dependence on supplemental oxygen. With that being said I am going to need to look into her heart failure and the significance of this as far as hyperbarics is concerned before we initiate treatment. She is probably also going to need an updated EKG and chest x-ray unless its already been done recently. 05/28/2021 upon evaluation today patient  appears to be doing well with regard to her wound all things considered. I am going to try to see what we can do about trimmed away some of the necrotic tissue that is really just hanging on here and there and not really helpful. Feel like if we can slowly start doing this maybe we will be able to get to the point where this is better and better as far as cleaning up the surface of the wound is concerned. Still as I explained I do believe some of her toes are not salvageable but again she is looking more towards autoamputation versus want to do anything definitive here from a surgical standpoint. I have been researching whether she would be an okay candidate for hyperbarics or not and I found that her x-ray but she had a chest x-ray recently was  negative for any acute disease and appear to be stable. EKG was also stable. She did also have a cardiac echo which showed that her ejection fraction was 60 to 65% and therefore I think she is a candidate for hyperbarics. Again I do believe that this can help with preventing amputation which right now she has been told that her only option would be a below-knee amputation. Nonetheless I think that if we can get the heel leading to spread and the majority of this cleared away that it is possible we might even be able to get a surgeon just to surgically remove the nonviable toes and not have to go the full way of complete amputation. Nonetheless we do have a ways to go before we will get to that point. Either way I think the hyperbarics could be an excellent support as far as that is concerned. [name], [name] ([id number]) [id number] Page 7 of 9 06-05-2021 upon evaluation today patient's wound is showing some signs of improvement. This does seem to be slow and of course it is understandable considering what we are seeing. She has discussed and looked up information about hyperbarics and in the end comes back today and states that she does want to proceed. I honestly do feel like that is her best chance at saving the foot. Even if we are able to get her into a surgeon after we obtain some good healing approximately so that there is not as much necrotic tissue in general she may even be able to have a transmetatarsal amputation and get this to heal. Again I am not saying this is can be an easy road but it may be her best road as far as thinking about how to achieve some type of complete healing as I do not believe the toes are viable at all to be honest. 07-06-2021 upon evaluation today patient appears to be doing well currently in regard to her foot all things considered. Again she unfortunately was unable to do the hyperbaric oxygen therapy this was due to her claustrophobia.  Nonetheless she tells me that she is pleased with how the foot appears and she does feel like there is good improvement going on here. In general I think that we are on the right track. If she is going to take a very long time and to be honest the route of autoamputation which is pretty much the way this is going is not a quick or easy road but nonetheless she seems to be taking care of her foot quite well. She is in good spirits. 07-27-2021 upon evaluation today patient appears to be doing well currently in regard  to her foot all things considered. A lot of the eschar is starting to lift up around the edges which is good there is still significant necrotic tissue on the distal portion of the foot. This is slowly going to work its way off. Based on what I am seeing currently I do not believe that there is any evidence of infection which is good news. 08-31-2021 upon evaluation today patient's wound actually appears to be doing decently well. I do not see anything in particular to try to trim away today. With that being said she continues to slowly allow this to do what is going to do as far as autoamputation is concerned. I did have another conversation with her today about the possibility of seeing a surgeon for a transmetatarsal amputation which again would help to get this to close faster and obviously I think would be a much speedy year and safer way to proceed with amputation. With that being said she tells me that she would really prefer to just let this go at "God pace". 09-28-2021 upon evaluation today patient's foot actually appears to be doing much better. Fortunately I do not see any signs of infection which is great news and overall I am extremely pleased with where we stand today. There does not appear to be any signs of active infection locally or systemically which is great news. No fevers, chills, nausea, vomiting, or diarrhea. 10-26-2021 upon evaluation today patient appears to be doing well  currently in regard to her foot all things considered. Fortunately I do not see any evidence of active infection locally or systemically at this time which is great news. No fevers, chills, nausea, vomiting, or diarrhea. She does have some of the eschar that is lifting up even a little bit more compared to last time I saw her. 11-30-2021 upon evaluation today patient's wound is still continuing to have some areas that lift up although there is nothing really that I feel like I could trim away too much today to be honest. Fortunately I do not see any evidence of systemic infection though locally she has been concerned about some drainage I do not see anything that seems to be actively red or erythematous or warm to touch which is good news. No fevers, chills, nausea, vomiting, or diarrhea. 12-21-2021 upon evaluation patient's foot ulcer actually is showing signs of some improvement with healthier tissue noted at areas with eschar that keeps clearing off. There is a bit of the eschar that is loosening up today as well and I think we will get a be able to get some of this cleared away that we have been working on for a while. Fortunately I do not see any evidence of infection locally or systemically at this time which is great news. No fevers, chills, nausea, vomiting, or diarrhea. 01-11-2022 upon evaluation today patient appears to be doing decently well in regard to her foot she still has a necrotic area on the tip of her foot but she is tolerating the dressing changes without complication. Fortunately there does not appear to be any signs of infection locally or systemically at this time which is great news. No fevers, chills, nausea, vomiting, or diarrhea. 02-01-2022 upon evaluation today patient continues to show signs of improvement in regard to the area where we have been able to remove the eschar. Unfortunately the tip of the foot I think is the part that is completely dead and not going to be  coming back as far  as any healing is concerned. I have discussed this with the patient multiple times however she does not want to have any type of surgical intervention. Obviously in that regard we will try to do what we can as best we can. Objective Constitutional Vitals Time Taken: 9:32 AM, Temperature: 98.2 F, Pulse: 88 bpm, Respiratory Rate: 18 breaths/min, Blood Pressure: 137/72 mmHg. Psychiatric this patient is able to make decisions and demonstrates good insight into disease process. Alert and Oriented x 3. pleasant and cooperative. General Notes: Upon inspection patient showed signs of some good granulation and epithelization. Again the foot from the central part headed towards the end seems to be healing decently well but at the same time unfortunately the end of the foot just does not go to recover this is completely eschared and dry gangrene. Integumentary (Hair, Skin) Wound #2 status is Open. Original cause of wound was Gradually Appeared. The date acquired was: 10/15/2020. The wound has been in treatment 37 weeks. The wound is located on the Right,Circumferential Foot. The wound measures 9cm length x 6.8cm width x 0.2cm depth; 48.066cm^2 area and 9.613cm^3 volume. There is Fat Layer (Subcutaneous Tissue) exposed. There is no tunneling or undermining noted. There is a medium amount of serosanguineous drainage noted. There is medium (34-66%) granulation within the wound bed. There is a medium (34-66%) amount of necrotic tissue within the wound bed including Eschar and Necrosis of Bone. Assessment Active Problems ICD-10 Chronic multifocal osteomyelitis, right ankle and foot Type 2 diabetes mellitus with foot ulcer Non-pressure chronic ulcer of other part of right foot with necrosis of bone Chronic combined systolic (congestive) and diastolic (congestive) heart failure [name], [name] ([id number]) [id number] Page 8 of 9 Essential (primary)  hypertension End stage renal disease Dependence on renal dialysis Dependence on supplemental oxygen Procedures Wound #2 Pre-procedure diagnosis of Wound #2 is a Diabetic Wound/Ulcer of the Lower Extremity located on the Right,Circumferential Foot .Severity of Tissue Pre Debridement is: Necrosis of bone. There was a Excisional Skin/Subcutaneous Tissue Debridement with a total area of 70 sq cm performed by Tommie Sams., PA-C. With the following instrument(s): Curette to remove Viable and Non-Viable tissue/material. Material removed includes Callus, Subcutaneous Tissue, and Slough. A time out was conducted at 09:52, prior to the start of the procedure. A Minimum amount of bleeding was controlled with Pressure. The procedure was tolerated well. Post Debridement Measurements: 9cm length x 6.8cm width x 0.2cm depth; 9.613cm^3 volume. Character of Wound/Ulcer Post Debridement is stable. Severity of Tissue Post Debridement is: Necrosis of bone. Post procedure Diagnosis Wound #2: Same as Pre-Procedure Plan Follow-up Appointments: Return Appointment in 3 weeks. Nurse Visit as needed Home Health: [facility] for wound care. May utilize formulary equivalent dressing for wound treatment orders unless otherwise specified. Home Health Nurse may visit PRN to address patientoos wound care needs. Scheduled days for dressing changes to be completed; exception, patient has scheduled wound care visit that day. **Please direct any NON-WOUND related issues/requests for orders to patient's Primary Care Physician. **If current dressing causes regression in wound condition, may D/C ordered dressing product/s and apply Normal Saline Moist Dressing daily until next [facility] or Other MD appointment. **Notify [facility] of regression in wound condition at [phone]. Bathing/ Shower/ Hygiene: Clean wound with Normal Saline or wound cleanser. Wash  wounds with antibacterial soap and water. May shower; gently cleanse wound with antibacterial soap, rinse and pat dry prior to dressing wounds No tub bath.  Off-Loading: Open toe surgical shoe Hyperbaric Oxygen Therapy: Evaluate for HBO Therapy Indication and location: - right foot If appropriate for treatment, begin HBOT per protocol: 2.5 ATA for 90 Minutes with 2 Five (5) Minute Air Breaks One treatment per day (delivered Monday through Friday unless otherwise specified in Special Instructions below): T # of Treatments: - 40 otal Finger stick Blood Glucose Pre- and Post- HBOT Treatment. Follow Hyperbaric Oxygen Glycemia Protocol WOUND #2: - Foot Wound Laterality: Right, Circumferential Cleanser: Dakin 16 (oz) 0.25 1 x Per Day/30 Days Discharge Instructions: apply wet/dry dressing over top of oil emersion dressing Cleanser: Normal Saline 1 x Per Day/30 Days Discharge Instructions: Wash your hands with soap and water. Remove old dressing, discard into plastic bag and place into trash. Cleanse the wound with Normal Saline prior to applying a clean dressing using gauze sponges, not tissues or cotton balls. Do not scrub or use excessive force. Pat dry using gauze sponges, not tissue or cotton balls. Cleanser: Soap and Water 1 x Per Day/30 Days Discharge Instructions: Gently cleanse wound with antibacterial soap, rinse and pat dry prior to dressing wounds Topical: Betadine 1 x Per Day/30 Days Discharge Instructions: apply gauze soaked dressing to top of foot and toes Prim Dressing: Gauze 1 x Per Day/30 Days ary Discharge Instructions: As directed: betadine applied Prim Dressing: Prisma 4.34 (in) 1 x Per Day/30 Days ary Discharge Instructions: Moisten w/normal saline or sterile water; Cover wound as directed. Do not remove from wound bed. Secondary Dressing: ABD Pad 5x9 (in/in) 1 x Per Day/30 Days Discharge Instructions: Cover with ABD pad Secured With: Medipore T - 40M Medipore H Soft  Cloth Surgical T ape ape, 2x2 (in/yd) 1 x Per Day/30 Days Secured With: [employer] Sterile or Non-Sterile 6-ply 4.5x4 (yd/yd) 1 x Per Day/30 Days Discharge Instructions: Apply Kerlix as directed Secured With: [employer] Dressing, Latex-free, Size 5, Small-Head / Shoulder / Thigh 1 x Per Day/30 Days 1. Based on what I see I do believe that the patient is going to continue currently with the wound care measures as before and this includes the use of the Betadine to the end of the foot which I think is still best. 2. With regard to the part will be cleaned off most of the eschar and it actually looks healthy underneath, recommend switching over to collagen to see if this will be beneficial. 3. And also did recommend that she should continue to stay off of this is much as possible to try and allow this to heal obviously. We will see patient back for reevaluation in 1 week here in the clinic. If anything worsens or changes patient will contact our office for additional recommendations. I do still believe that the patient could benefit from surgical intervention in regard to the gangrenous portion of the end of her foot. With that being said she has declined this multiple times and still continues to do so. I can always make that referral if she changes her mind. [name], [name] ([id number]) [id number] Page 9 of 9 Electronic Signature(s) Signed: 02/01/2022 10:08:54 AM By: Worthy Keeler PA-C Entered By: Worthy Keeler on 02/01/2022 10:08:54 -------------------------------------------------------------------------------- SuperBill Details Patient Name: Date of Service: Burak, [name] [name] M. 02/01/2022 Medical Record Number: [id number] Patient Account Number: [id number] Date of Birth/Sex: Treating RN: [id number] (51 y.o. [name] Primary Care Provider: Evern Bio Other Clinician: Massie Kluver Referring Provider: Treating Provider/Extender: Darien Ramus Weeks in Treatment: 37 Diagnosis Coding ICD-10 Codes  Code Description M86.371 Chronic multifocal osteomyelitis, right ankle and foot E11.621 Type 2 diabetes mellitus with foot ulcer L97.514 Non-pressure chronic ulcer of other part of right foot with necrosis of bone I50.42 Chronic combined systolic (congestive) and diastolic (congestive) heart failure I10 Essential (primary) hypertension N18.6 End stage renal disease Z99.2 Dependence on renal dialysis Z99.81 Dependence on supplemental oxygen Facility Procedures : 3 CPT4 Code: 1540086 Description: 76195 - DEB SUBQ TISSUE 20 SQ CM/< ICD-10 Diagnosis Description L97.514 Non-pressure chronic ulcer of other part of right foot with necrosis of bone Modifier: Quantity: 1 : 3 CPT4 Code: 0932671 Description: 11045 - DEB SUBQ TISS EA ADDL 20CM ICD-10 Diagnosis Description L97.514 Non-pressure chronic ulcer of other part of right foot with necrosis of bone Modifier: Quantity: 3 Physician Procedures : CPT4 Code Description Modifier [id number] 11042 - WC PHYS SUBQ TISS 20 SQ CM ICD-10 Diagnosis Description L97.514 Non-pressure chronic ulcer of other part of right foot with necrosis of bone Quantity: 1 : [id number] 11045 - WC PHYS SUBQ TISS EA ADDL 20 CM ICD-10 Diagnosis Description L97.514 Non-pressure chronic ulcer of other part of right foot with necrosis of bone Quantity: 3 Electronic Signature(s) Signed: 02/01/2022 10:09:12 AM By: Worthy Keeler PA-C Entered By: Worthy Keeler on 02/01/2022 10:09:12

## 2022-02-01 NOTE — Progress Notes (Signed)
Base, [name] ([id number]) [id number] Page 1 of 10 Visit Report for 02/01/2022 Arrival Information Details Patient Name: Date of Service: [name], [name] 02/01/2022 9:30 A M Medical Record Number: [id number] Patient Account Number: [id number] Date of Birth/Sex: Treating RN: 12-17-1970 (51 y.o. [name] Primary Care Erik Burkett: Evern Bio Other Clinician: Massie Kluver Referring Melyssa Signor: Treating Elwyn Lowden/Extender: Darien Ramus Weeks in Treatment: 37 Visit Information History Since Last Visit All ordered tests and consults were completed: No Patient Arrived: Wheel Chair Added or deleted any medications: No Arrival Time: 09:30 Any new allergies or adverse reactions: No Transfer Assistance: EasyPivot Patient Lift Had a fall or experienced change in No Patient Identification Verified: Yes activities of daily living that may affect Secondary Verification Process Completed: Yes risk of falls: Patient Requires Transmission-Based Precautions: No Signs or symptoms of abuse/neglect since last visito No Patient Has Alerts: No Hospitalized since last visit: No Implantable device outside of the clinic excluding No cellular tissue based products placed in the center since last visit: Has Dressing in Place as Prescribed: Yes Pain Present Now: No Electronic Signature(s) Signed: 02/01/2022 10:21:17 AM By: Massie Kluver Entered By: Massie Kluver on 02/01/2022 09:31:05 -------------------------------------------------------------------------------- Clinic Level of Care Assessment Details Patient Name: Date of Service: [name], [name] 02/01/2022 9:30 A M Medical Record Number: [id number] Patient Account Number: [id number] Date of Birth/Sex: Treating RN: 1970/10/13 (51 y.o. [name] Primary Care Mujtaba Bollig: Evern Bio Other Clinician: Massie Kluver Referring Saidy Ormand: Treating Brianda Beitler/Extender: Darien Ramus Weeks in Treatment: 37 Clinic Level of Care Assessment Items TOOL 1 Quantity Score '[]'$  - 0 Use when EandM and Procedure is performed on INITIAL visit ASSESSMENTS - Nursing Assessment / Reassessment '[]'$  - 0 General Physical Exam (combine w/ comprehensive assessment (listed just below) when performed on new pt. evals) '[]'$  - 0 Comprehensive Assessment (HX, ROS, Risk Assessments, Wounds Hx, etc.) [place]avra, [name] ([id number]) [id number] Page 2 of 10 ASSESSMENTS - Wound and Skin Assessment / Reassessment '[]'$  - 0 Dermatologic / Skin Assessment (not related to wound area) ASSESSMENTS - Ostomy and/or Continence Assessment and Care '[]'$  - 0 Incontinence Assessment and Management '[]'$  - 0 Ostomy Care Assessment and Management (repouching, etc.) PROCESS - Coordination of Care '[]'$  - 0 Simple Patient / Family Education for ongoing care '[]'$  - 0 Complex (extensive) Patient / Family Education for ongoing care '[]'$  - 0 Staff obtains Programmer, systems, Records, T Results / Process Orders est '[]'$  - 0 Staff telephones HHA, Nursing Homes / Clarify orders / etc '[]'$  - 0 Routine Transfer to another Facility (non-emergent condition) '[]'$  - 0 Routine Hospital Admission (non-emergent condition) '[]'$  - 0 New Admissions / Biomedical engineer / Ordering NPWT Apligraf, etc. , '[]'$  - 0 Emergency Hospital Admission (emergent condition) PROCESS - Special Needs '[]'$  - 0 Pediatric / Minor Patient Management '[]'$  - 0 Isolation Patient Management '[]'$  - 0 Hearing / Language / Visual special needs '[]'$  - 0 Assessment of Community assistance (transportation, D/C planning, etc.) '[]'$  - 0 Additional assistance / Altered mentation '[]'$  - 0 Support Surface(s) Assessment (bed, cushion, seat, etc.) INTERVENTIONS - Miscellaneous '[]'$  - 0 External ear exam '[]'$  - 0 Patient Transfer (multiple staff / Civil Service fast streamer / Similar devices) '[]'$  - 0 Simple Staple / Suture removal (25 or less) '[]'$  - 0 Complex Staple / Suture  removal (26 or more) '[]'$  - 0 Hypo/Hyperglycemic Management (do not check if billed separately) '[]'$  - 0 Ankle / Brachial Index (ABI) - do not check if billed  separately Has the patient been seen at the hospital within the last three years: Yes Total Score: 0 Level Of Care: ____ Electronic Signature(s) Signed: 02/01/2022 10:21:17 AM By: Massie Kluver Entered By: Massie Kluver on 02/01/2022 10:11:48 -------------------------------------------------------------------------------- Encounter Discharge Information Details Patient Name: Date of Service: [name], [name] 02/01/2022 9:30 A M Medical Record Number: [id number] Patient Account Number: [id number] Date of Birth/Sex: Treating RN: 25-Apr-1970 (51 y.o. [name] Primary Care Kennita Pavlovich: Evern Bio Other Clinician: Massie Kluver Referring Lamyah Creed: Treating Dhairya Corales/Extender: Darien Ramus Rastetter, Sidman ([id number]) [id number] Page 3 of 10 Weeks in Treatment: 37 Encounter Discharge Information Items Post Procedure Vitals Discharge Condition: Stable Temperature (F): 98.2 Ambulatory Status: Wheelchair Pulse (bpm): 88 Discharge Destination: Home Respiratory Rate (breaths/min): 18 Transportation: Private Auto Blood Pressure (mmHg): 137/72 Accompanied By: husband Schedule Follow-up Appointment: Yes Clinical Summary of Care: Electronic Signature(s) Signed: 02/01/2022 10:21:17 AM By: Massie Kluver Entered By: Massie Kluver on 02/01/2022 10:13:37 -------------------------------------------------------------------------------- Lower Extremity Assessment Details Patient Name: Date of Service: [name], [name] 02/01/2022 9:30 A M Medical Record Number: [id number] Patient Account Number: [id number] Date of Birth/Sex: Treating RN: 1970/11/28 (51 y.o. [name] Primary Care Jeananne Bedwell: Evern Bio Other Clinician: Massie Kluver Referring Jacora Hopkins: Treating Arnisha Laffoon/Extender:  Darien Ramus Weeks in Treatment: 37 Electronic Signature(s) Signed: 02/01/2022 10:21:17 AM By: Massie Kluver Signed: 02/01/2022 11:32:10 AM By: Gretta Cool, BSN, RN, CWS, Kim RN, BSN Entered By: Massie Kluver on 02/01/2022 09:45:21 -------------------------------------------------------------------------------- Multi Wound Chart Details Patient Name: Date of Service: [name], [name] 02/01/2022 9:30 A M Medical Record Number: [id number] Patient Account Number: [id number] Date of Birth/Sex: Treating RN: 01/28/1971 (51 y.o. [name] Primary Care Zaya Kessenich: Evern Bio Other Clinician: Massie Kluver Referring Kijuana Ruppel: Treating Giannamarie Paulus/Extender: Darien Ramus Weeks in Treatment: 37 Vital Signs Height(in): Pulse(bpm): 88 Weight(lbs): Blood Pressure(mmHg): 137/72 Body Mass Index(BMI): Temperature(F): 98.2 Respiratory Rate(breaths/min): [address] M ([id number]):Photos:] [[id number] Page 4 of 10:2 N/A N/A N/A N/A] Right, Circumferential Foot N/A N/A Wound Location: Gradually Appeared N/A N/A Wounding Event: Diabetic Wound/Ulcer of the Lower N/A N/A Primary Etiology: Extremity Cataracts, Anemia, Asthma, Chronic N/A N/A Comorbid History: Obstructive Pulmonary Disease (COPD), Congestive Heart Failure, Hypertension, Type II Diabetes, End Stage Renal Disease, Osteoarthritis, Neuropathy 10/15/2020 N/A N/A Date Acquired: 47 N/A N/A Weeks of Treatment: Open N/A N/A Wound Status: No N/A N/A Wound Recurrence: Yes N/A N/A Pending A mputation on Presentation: 9x6.8x0.2 N/A N/A Measurements L x W x D (cm) 48.066 N/A N/A A (cm) : rea 9.613 N/A N/A Volume (cm) : 54.70% N/A N/A % Reduction in A rea: 95.50% N/A N/A % Reduction in Volume: Grade 4 N/A N/A Classification: Medium N/A N/A Exudate A mount: Serosanguineous N/A N/A Exudate Type: red, brown N/A N/A Exudate Color: Medium (34-66%) N/A  N/A Granulation A mount: Medium (34-66%) N/A N/A Necrotic A mount: Eschar, Necrosis of Bone N/A N/A Necrotic Tissue: Fat Layer (Subcutaneous Tissue): Yes N/A N/A Exposed Structures: Fascia: No Tendon: No Muscle: No Joint: No Bone: No Treatment Notes Electronic Signature(s) Signed: 02/01/2022 10:21:17 AM By: Massie Kluver Entered By: Massie Kluver on 02/01/2022 09:45:28 -------------------------------------------------------------------------------- Multi-Disciplinary Care Plan Details Patient Name: Date of Service: [name], [name] 02/01/2022 9:30 A M Medical Record Number: [id number] Patient Account Number: [id number] [name], GRUNER ([id number]) [id number] Page 5 of 10 Date of Birth/Sex: Treating RN: Feb 14, 1971 (51 y.o. [name] Primary Care Jaydee Conran: Other Clinician: Robyn Haber Referring Rikita Grabert: Treating Kendarious Gudino/Extender: Darien Ramus  Weeks in Treatment: 81 Active Inactive HBO Nursing Diagnoses: Anxiety related to feelings of confinement associated with the hyperbaric oxygen chamber Anxiety related to knowledge deficit of hyperbaric oxygen therapy and treatment procedures Discomfort related to temperature and humidity changes inside hyperbaric chamber Potential for barotraumas to ears, sinuses, teeth, and lungs or cerebral gas embolism related to changes in atmospheric pressure inside hyperbaric oxygen chamber Potential for oxygen toxicity seizures related to delivery of 100% oxygen at an increased atmospheric pressure Potential for pulmonary oxygen toxicity related to delivery of 100% oxygen at an increased atmospheric pressure Goals: Barotrauma will be prevented during HBO2 Date Initiated: 06/05/2021 T arget Resolution Date: 06/05/2021 Goal Status: Active Patient and/or family will be able to state/discuss factors appropriate to the management of their disease process during treatment Date  Initiated: 06/05/2021 T arget Resolution Date: 06/05/2021 Goal Status: Active Patient will tolerate the hyperbaric oxygen therapy treatment Date Initiated: 06/05/2021 T arget Resolution Date: 06/05/2021 Goal Status: Active Patient will tolerate the internal climate of the chamber Date Initiated: 06/05/2021 T arget Resolution Date: 06/05/2021 Goal Status: Active Patient/caregiver will verbalize understanding of HBO goals, rationale, procedures and potential hazards Date Initiated: 06/05/2021 T arget Resolution Date: 06/05/2021 Goal Status: Active Signs and symptoms of pulmonary oxygen toxicity will be recognized and promptly addressed Date Initiated: 06/05/2021 T arget Resolution Date: 06/05/2021 Goal Status: Active Signs and symptoms of seizure will be recognized and promptly addressed ; seizing patients will suffer no harm Date Initiated: 06/05/2021 T arget Resolution Date: 06/05/2021 Goal Status: Active Interventions: Administer a five (5) minute air break for patient if signs and symptoms of seizure appear and notify the hyperbaric physician Administer decongestants, per physician orders, prior to HBO2 Administer the correct therapeutic gas delivery based on the patients needs and limitations, per physician order Assess and provide for patients comfort related to the hyperbaric environment and equalization of middle ear Assess for signs and symptoms related to adverse events, including but not limited to confinement anxiety, pneumothorax, oxygen toxicity and baurotrauma Assess patient for any history of confinement anxiety Assess patient's knowledge and expectations regarding hyperbaric medicine and provide education related to the hyperbaric environment, goals of treatment and prevention of adverse events Implement protocols to decrease risk of pneumothorax in high risk patients Notes: Osteomyelitis Nursing Diagnoses: Infection: osteomyelitis Knowledge deficit related to disease process  and management Potential for infection: osteomyelitis Goals: Diagnostic evaluation for osteomyelitis completed as ordered Date Initiated: 06/05/2021 Target Resolution Date: 06/05/2021 Goal Status: Active Patient/caregiver will verbalize understanding of disease process and disease management Date Initiated: 06/05/2021 Target Resolution Date: 06/05/2021 Goal Status: Active [name] osteomyelitis will resolve Date Initiated: 06/05/2021 Target Resolution Date: 06/05/2021 Goal Status: Active Signs and symptoms for osteomyelitis will be recognized and promptly addressed Date Initiated: 06/05/2021 Target Resolution Date: 06/05/2021 HOORIA, GASPARINI ([id number]) [id number] Page 6 of 10 Goal Status: Active Interventions: Assess for signs and symptoms of osteomyelitis resolution every visit Provide education on osteomyelitis Notes: Wound/Skin Impairment Nursing Diagnoses: Impaired tissue integrity Knowledge deficit related to ulceration/compromised skin integrity Goals: Ulcer/skin breakdown will have a volume reduction of 30% by week 4 Date Initiated: 05/18/2021 Target Resolution Date: 06/15/2021 Goal Status: Active Ulcer/skin breakdown will have a volume reduction of 50% by week 8 Date Initiated: 05/18/2021 Target Resolution Date: 07/13/2021 Goal Status: Active Ulcer/skin breakdown will have a volume reduction of 80% by week 12 Date Initiated: 05/18/2021 Target Resolution Date: 08/10/2021 Goal Status: Active Ulcer/skin breakdown will heal within 14 weeks Date Initiated: 05/18/2021 Target Resolution Date: 08/24/2021  Goal Status: Active Interventions: Assess patient/caregiver ability to obtain necessary supplies Assess patient/caregiver ability to perform ulcer/skin care regimen upon admission and as needed Assess ulceration(s) every visit Provide education on ulcer and skin care Notes: Electronic Signature(s) Signed: 02/01/2022 10:21:17 AM By: Massie Kluver Signed: 02/01/2022 11:32:10 AM By: Gretta Cool, BSN, RN, CWS, Kim RN, BSN Entered By: Massie Kluver on 02/01/2022 10:12:44 -------------------------------------------------------------------------------- Pain Assessment Details Patient Name: Date of Service: [name], [name] 02/01/2022 9:30 A M Medical Record Number: [id number] Patient Account Number: [id number] Date of Birth/Sex: Treating RN: [birth year] (51 y.o. [name] Primary Care Ladarrius Bogdanski: Evern Bio Other Clinician: Massie Kluver Referring Binyamin Nelis: Treating Marilynne Dupuis/Extender: Darien Ramus Weeks in Treatment: 37 Active Problems Location of Pain Severity and Description of Pain Patient Has Paino No Site Locations [place], [name] ([id number]) [id number] Page 7 of 10 Pain Management and Medication Current Pain Management: Electronic Signature(s) Signed: 02/01/2022 10:21:17 AM By: Massie Kluver Signed: 02/01/2022 11:32:10 AM By: Gretta Cool, BSN, RN, CWS, Kim RN, BSN Entered By: Massie Kluver on 02/01/2022 09:36:44 -------------------------------------------------------------------------------- Patient/Caregiver Education Details Patient Name: Date of Service: [name], [name] 12/8/2023andnbsp9:30 A M Medical Record Number: [id number] Patient Account Number: [id number] Date of Birth/Gender: Treating RN: [birth year] (51 y.o. [name] Primary Care Physician: Evern Bio Other Clinician: Massie Kluver Referring Physician: Treating Physician/Extender: Darien Ramus Weeks in Treatment: 86 Education Assessment Education Provided To: Patient Education Topics Provided Wound/Skin Impairment: Handouts: Other: continue wound care as directed Methods: Explain/Verbal Responses: State content correctly Electronic Signature(s) Signed: 02/01/2022 10:21:17 AM By: Massie Kluver Entered By: Massie Kluver on 02/01/2022 10:12:26 Forgy, [name] ([id number])  [id number] Page 8 of 10 -------------------------------------------------------------------------------- Wound Assessment Details Patient Name: Date of Service: [name], [name] 02/01/2022 9:30 A M Medical Record Number: [id number] Patient Account Number: [id number] Date of Birth/Sex: Treating RN: 1970/04/09 (51 y.o. [name] Primary Care Torrin Crihfield: Evern Bio Other Clinician: Massie Kluver Referring Terriana Barreras: Treating [name]/Extender: Darien Ramus Weeks in Treatment: 37 Wound Status Wound Number: 2 Primary Diabetic Wound/Ulcer of the Lower Extremity Etiology: Wound Location: Right, Circumferential Foot Wound Open Wounding Event: Gradually Appeared Status: Date Acquired: 10/15/2020 Notes: open areas to midline right foot, lateral right foot, and top of right Weeks Of Treatment: 37 foot Clustered Wound: No Comorbid Cataracts, Anemia, Asthma, Chronic Obstructive Pulmonary Pending Amputation On Presentation History: Disease (COPD), Congestive Heart Failure, Hypertension, Type II Diabetes, End Stage Renal Disease, Osteoarthritis, Neuropathy Photos Wound Measurements Length: (cm) 9 Width: (cm) 6.8 Depth: (cm) 0.2 Area: (cm) 48.066 Volume: (cm) 9.613 % Reduction in Area: 54.7% % Reduction in Volume: 95.5% Tunneling: No Undermining: No Wound Description Classification: Grade 4 Exudate Amount: Medium Exudate Type: Serosanguineous Exudate Color: red, brown Foul Odor After Cleansing: No Slough/Fibrino Yes Wound Bed Granulation Amount: Medium (34-66%) Exposed Structure Necrotic Amount: Medium (34-66%) Fascia Exposed: No Necrotic Quality: Eschar, Bone Fat Layer (Subcutaneous Tissue) Exposed: Yes Tendon Exposed: No Muscle Exposed: No Joint Exposed: No Bone Exposed: No Treatment Notes Wound #2 (Foot) Wound Laterality: Right, Circumferential Cleanser Dakin 16 (oz) 0.25 Discharge Instruction: apply wet/dry dressing over  top of oil emersion dressing [place], [name] ([id number]) [id number] Page 9 of 10 Normal Saline Discharge Instruction: Wash your hands with soap and water. Remove old dressing, discard into plastic bag and place into trash. Cleanse the wound with Normal Saline prior to applying a clean dressing using gauze sponges, not tissues or cotton balls. Do not scrub or use excessive force. Pat dry using  gauze sponges, not tissue or cotton balls. Soap and Water Discharge Instruction: Gently cleanse wound with antibacterial soap, rinse and pat dry prior to dressing wounds Peri-Wound Care Topical Betadine Discharge Instruction: apply gauze soaked dressing to top of foot and toes Primary Dressing Gauze Discharge Instruction: As directed: betadine applied Prisma 4.34 (in) Discharge Instruction: Moisten w/normal saline or sterile water; Cover wound as directed. Do not remove from wound bed. Secondary Dressing ABD Pad 5x9 (in/in) Discharge Instruction: Cover with ABD pad Secured With Medipore T - 52M Medipore H Soft Cloth Surgical T ape ape, 2x2 (in/yd) Kerlix Roll Sterile or Non-Sterile 6-ply 4.5x4 (yd/yd) Discharge Instruction: Apply Kerlix as directed Stretch Net Dressing, Latex-free, Size 5, Small-Head / Shoulder / Thigh Compression Wrap Compression Stockings Add-Ons Electronic Signature(s) Signed: 02/01/2022 10:21:17 AM By: Massie Kluver Signed: 02/01/2022 11:32:10 AM By: Gretta Cool, BSN, RN, CWS, Kim RN, BSN Entered By: Massie Kluver on 02/01/2022 09:44:58 -------------------------------------------------------------------------------- Vitals Details Patient Name: Date of Service: [name], [name] 02/01/2022 9:30 A M Medical Record Number: [id number] Patient Account Number: [id number] Date of Birth/Sex: Treating RN: Apr 03, 1970 (51 y.o. [name] Primary Care Rael Yo: Evern Bio Other Clinician: Massie Kluver Referring Luccia Reinheimer: Treating Lestat Golob/Extender:  Darien Ramus Weeks in Treatment: 37 Vital Signs Time Taken: 09:32 Temperature (F): 98.2 Pulse (bpm): 88 Respiratory Rate (breaths/min): 18 Blood Pressure (mmHg): 137/72 Reference Range: 80 - 120 mg / dl Electronic Signature(s) Signed: 02/01/2022 10:21:17 AM By: Massie Kluver Entered By: Massie Kluver on 02/01/2022 09:36:29 Guettler, [name] ([id number]) [id number] Page 10 of 10

## 2022-02-02 DIAGNOSIS — N186 End stage renal disease: Secondary | ICD-10-CM | POA: Diagnosis not present

## 2022-02-02 DIAGNOSIS — Z992 Dependence on renal dialysis: Secondary | ICD-10-CM | POA: Diagnosis not present

## 2022-02-03 DIAGNOSIS — N186 End stage renal disease: Secondary | ICD-10-CM | POA: Diagnosis not present

## 2022-02-03 DIAGNOSIS — Z992 Dependence on renal dialysis: Secondary | ICD-10-CM | POA: Diagnosis not present

## 2022-02-04 DIAGNOSIS — Z9981 Dependence on supplemental oxygen: Secondary | ICD-10-CM | POA: Diagnosis not present

## 2022-02-04 DIAGNOSIS — Z7951 Long term (current) use of inhaled steroids: Secondary | ICD-10-CM | POA: Diagnosis not present

## 2022-02-04 DIAGNOSIS — E1122 Type 2 diabetes mellitus with diabetic chronic kidney disease: Secondary | ICD-10-CM | POA: Diagnosis not present

## 2022-02-04 DIAGNOSIS — I132 Hypertensive heart and chronic kidney disease with heart failure and with stage 5 chronic kidney disease, or end stage renal disease: Secondary | ICD-10-CM | POA: Diagnosis not present

## 2022-02-04 DIAGNOSIS — D631 Anemia in chronic kidney disease: Secondary | ICD-10-CM | POA: Diagnosis not present

## 2022-02-04 DIAGNOSIS — Z7952 Long term (current) use of systemic steroids: Secondary | ICD-10-CM | POA: Diagnosis not present

## 2022-02-04 DIAGNOSIS — Z79891 Long term (current) use of opiate analgesic: Secondary | ICD-10-CM | POA: Diagnosis not present

## 2022-02-04 DIAGNOSIS — Z792 Long term (current) use of antibiotics: Secondary | ICD-10-CM | POA: Diagnosis not present

## 2022-02-04 DIAGNOSIS — E1151 Type 2 diabetes mellitus with diabetic peripheral angiopathy without gangrene: Secondary | ICD-10-CM | POA: Diagnosis not present

## 2022-02-04 DIAGNOSIS — I5032 Chronic diastolic (congestive) heart failure: Secondary | ICD-10-CM | POA: Diagnosis not present

## 2022-02-04 DIAGNOSIS — L97513 Non-pressure chronic ulcer of other part of right foot with necrosis of muscle: Secondary | ICD-10-CM | POA: Diagnosis not present

## 2022-02-04 DIAGNOSIS — E11621 Type 2 diabetes mellitus with foot ulcer: Secondary | ICD-10-CM | POA: Diagnosis not present

## 2022-02-04 DIAGNOSIS — N186 End stage renal disease: Secondary | ICD-10-CM | POA: Diagnosis not present

## 2022-02-04 DIAGNOSIS — L089 Local infection of the skin and subcutaneous tissue, unspecified: Secondary | ICD-10-CM | POA: Diagnosis not present

## 2022-02-04 DIAGNOSIS — Z794 Long term (current) use of insulin: Secondary | ICD-10-CM | POA: Diagnosis not present

## 2022-02-04 DIAGNOSIS — Z992 Dependence on renal dialysis: Secondary | ICD-10-CM | POA: Diagnosis not present

## 2022-02-05 DIAGNOSIS — Z992 Dependence on renal dialysis: Secondary | ICD-10-CM | POA: Diagnosis not present

## 2022-02-05 DIAGNOSIS — N186 End stage renal disease: Secondary | ICD-10-CM | POA: Diagnosis not present

## 2022-02-06 DIAGNOSIS — Z794 Long term (current) use of insulin: Secondary | ICD-10-CM | POA: Diagnosis not present

## 2022-02-06 DIAGNOSIS — Z992 Dependence on renal dialysis: Secondary | ICD-10-CM | POA: Diagnosis not present

## 2022-02-06 DIAGNOSIS — Z7952 Long term (current) use of systemic steroids: Secondary | ICD-10-CM | POA: Diagnosis not present

## 2022-02-06 DIAGNOSIS — I132 Hypertensive heart and chronic kidney disease with heart failure and with stage 5 chronic kidney disease, or end stage renal disease: Secondary | ICD-10-CM | POA: Diagnosis not present

## 2022-02-06 DIAGNOSIS — Z792 Long term (current) use of antibiotics: Secondary | ICD-10-CM | POA: Diagnosis not present

## 2022-02-06 DIAGNOSIS — E1151 Type 2 diabetes mellitus with diabetic peripheral angiopathy without gangrene: Secondary | ICD-10-CM | POA: Diagnosis not present

## 2022-02-06 DIAGNOSIS — N186 End stage renal disease: Secondary | ICD-10-CM | POA: Diagnosis not present

## 2022-02-06 DIAGNOSIS — Z9981 Dependence on supplemental oxygen: Secondary | ICD-10-CM | POA: Diagnosis not present

## 2022-02-06 DIAGNOSIS — E1122 Type 2 diabetes mellitus with diabetic chronic kidney disease: Secondary | ICD-10-CM | POA: Diagnosis not present

## 2022-02-06 DIAGNOSIS — D631 Anemia in chronic kidney disease: Secondary | ICD-10-CM | POA: Diagnosis not present

## 2022-02-06 DIAGNOSIS — L089 Local infection of the skin and subcutaneous tissue, unspecified: Secondary | ICD-10-CM | POA: Diagnosis not present

## 2022-02-06 DIAGNOSIS — Z79891 Long term (current) use of opiate analgesic: Secondary | ICD-10-CM | POA: Diagnosis not present

## 2022-02-06 DIAGNOSIS — L97513 Non-pressure chronic ulcer of other part of right foot with necrosis of muscle: Secondary | ICD-10-CM | POA: Diagnosis not present

## 2022-02-06 DIAGNOSIS — I5032 Chronic diastolic (congestive) heart failure: Secondary | ICD-10-CM | POA: Diagnosis not present

## 2022-02-06 DIAGNOSIS — E11621 Type 2 diabetes mellitus with foot ulcer: Secondary | ICD-10-CM | POA: Diagnosis not present

## 2022-02-06 DIAGNOSIS — Z7951 Long term (current) use of inhaled steroids: Secondary | ICD-10-CM | POA: Diagnosis not present

## 2022-02-07 DIAGNOSIS — N186 End stage renal disease: Secondary | ICD-10-CM | POA: Diagnosis not present

## 2022-02-07 DIAGNOSIS — Z992 Dependence on renal dialysis: Secondary | ICD-10-CM | POA: Diagnosis not present

## 2022-02-08 DIAGNOSIS — Z7951 Long term (current) use of inhaled steroids: Secondary | ICD-10-CM | POA: Diagnosis not present

## 2022-02-08 DIAGNOSIS — L97513 Non-pressure chronic ulcer of other part of right foot with necrosis of muscle: Secondary | ICD-10-CM | POA: Diagnosis not present

## 2022-02-08 DIAGNOSIS — Z992 Dependence on renal dialysis: Secondary | ICD-10-CM | POA: Diagnosis not present

## 2022-02-08 DIAGNOSIS — E11621 Type 2 diabetes mellitus with foot ulcer: Secondary | ICD-10-CM | POA: Diagnosis not present

## 2022-02-08 DIAGNOSIS — D631 Anemia in chronic kidney disease: Secondary | ICD-10-CM | POA: Diagnosis not present

## 2022-02-08 DIAGNOSIS — Z7952 Long term (current) use of systemic steroids: Secondary | ICD-10-CM | POA: Diagnosis not present

## 2022-02-08 DIAGNOSIS — N186 End stage renal disease: Secondary | ICD-10-CM | POA: Diagnosis not present

## 2022-02-08 DIAGNOSIS — L089 Local infection of the skin and subcutaneous tissue, unspecified: Secondary | ICD-10-CM | POA: Diagnosis not present

## 2022-02-08 DIAGNOSIS — Z794 Long term (current) use of insulin: Secondary | ICD-10-CM | POA: Diagnosis not present

## 2022-02-08 DIAGNOSIS — Z79891 Long term (current) use of opiate analgesic: Secondary | ICD-10-CM | POA: Diagnosis not present

## 2022-02-08 DIAGNOSIS — I5032 Chronic diastolic (congestive) heart failure: Secondary | ICD-10-CM | POA: Diagnosis not present

## 2022-02-08 DIAGNOSIS — Z792 Long term (current) use of antibiotics: Secondary | ICD-10-CM | POA: Diagnosis not present

## 2022-02-08 DIAGNOSIS — Z9981 Dependence on supplemental oxygen: Secondary | ICD-10-CM | POA: Diagnosis not present

## 2022-02-08 DIAGNOSIS — E1122 Type 2 diabetes mellitus with diabetic chronic kidney disease: Secondary | ICD-10-CM | POA: Diagnosis not present

## 2022-02-08 DIAGNOSIS — E1151 Type 2 diabetes mellitus with diabetic peripheral angiopathy without gangrene: Secondary | ICD-10-CM | POA: Diagnosis not present

## 2022-02-08 DIAGNOSIS — I132 Hypertensive heart and chronic kidney disease with heart failure and with stage 5 chronic kidney disease, or end stage renal disease: Secondary | ICD-10-CM | POA: Diagnosis not present

## 2022-02-09 DIAGNOSIS — N186 End stage renal disease: Secondary | ICD-10-CM | POA: Diagnosis not present

## 2022-02-09 DIAGNOSIS — Z992 Dependence on renal dialysis: Secondary | ICD-10-CM | POA: Diagnosis not present

## 2022-02-11 DIAGNOSIS — Z79891 Long term (current) use of opiate analgesic: Secondary | ICD-10-CM | POA: Diagnosis not present

## 2022-02-11 DIAGNOSIS — I5032 Chronic diastolic (congestive) heart failure: Secondary | ICD-10-CM | POA: Diagnosis not present

## 2022-02-11 DIAGNOSIS — E1151 Type 2 diabetes mellitus with diabetic peripheral angiopathy without gangrene: Secondary | ICD-10-CM | POA: Diagnosis not present

## 2022-02-11 DIAGNOSIS — N186 End stage renal disease: Secondary | ICD-10-CM | POA: Diagnosis not present

## 2022-02-11 DIAGNOSIS — E1122 Type 2 diabetes mellitus with diabetic chronic kidney disease: Secondary | ICD-10-CM | POA: Diagnosis not present

## 2022-02-11 DIAGNOSIS — L089 Local infection of the skin and subcutaneous tissue, unspecified: Secondary | ICD-10-CM | POA: Diagnosis not present

## 2022-02-11 DIAGNOSIS — Z992 Dependence on renal dialysis: Secondary | ICD-10-CM | POA: Diagnosis not present

## 2022-02-11 DIAGNOSIS — I132 Hypertensive heart and chronic kidney disease with heart failure and with stage 5 chronic kidney disease, or end stage renal disease: Secondary | ICD-10-CM | POA: Diagnosis not present

## 2022-02-11 DIAGNOSIS — D631 Anemia in chronic kidney disease: Secondary | ICD-10-CM | POA: Diagnosis not present

## 2022-02-11 DIAGNOSIS — Z794 Long term (current) use of insulin: Secondary | ICD-10-CM | POA: Diagnosis not present

## 2022-02-11 DIAGNOSIS — Z9981 Dependence on supplemental oxygen: Secondary | ICD-10-CM | POA: Diagnosis not present

## 2022-02-11 DIAGNOSIS — E11621 Type 2 diabetes mellitus with foot ulcer: Secondary | ICD-10-CM | POA: Diagnosis not present

## 2022-02-11 DIAGNOSIS — L97513 Non-pressure chronic ulcer of other part of right foot with necrosis of muscle: Secondary | ICD-10-CM | POA: Diagnosis not present

## 2022-02-11 DIAGNOSIS — Z7951 Long term (current) use of inhaled steroids: Secondary | ICD-10-CM | POA: Diagnosis not present

## 2022-02-11 DIAGNOSIS — Z792 Long term (current) use of antibiotics: Secondary | ICD-10-CM | POA: Diagnosis not present

## 2022-02-11 DIAGNOSIS — Z7952 Long term (current) use of systemic steroids: Secondary | ICD-10-CM | POA: Diagnosis not present

## 2022-02-12 DIAGNOSIS — N186 End stage renal disease: Secondary | ICD-10-CM | POA: Diagnosis not present

## 2022-02-12 DIAGNOSIS — Z992 Dependence on renal dialysis: Secondary | ICD-10-CM | POA: Diagnosis not present

## 2022-02-12 DIAGNOSIS — J449 Chronic obstructive pulmonary disease, unspecified: Secondary | ICD-10-CM | POA: Diagnosis not present

## 2022-02-13 DIAGNOSIS — Z794 Long term (current) use of insulin: Secondary | ICD-10-CM | POA: Diagnosis not present

## 2022-02-13 DIAGNOSIS — L089 Local infection of the skin and subcutaneous tissue, unspecified: Secondary | ICD-10-CM | POA: Diagnosis not present

## 2022-02-13 DIAGNOSIS — Z7951 Long term (current) use of inhaled steroids: Secondary | ICD-10-CM | POA: Diagnosis not present

## 2022-02-13 DIAGNOSIS — Z992 Dependence on renal dialysis: Secondary | ICD-10-CM | POA: Diagnosis not present

## 2022-02-13 DIAGNOSIS — L97513 Non-pressure chronic ulcer of other part of right foot with necrosis of muscle: Secondary | ICD-10-CM | POA: Diagnosis not present

## 2022-02-13 DIAGNOSIS — N186 End stage renal disease: Secondary | ICD-10-CM | POA: Diagnosis not present

## 2022-02-13 DIAGNOSIS — E1122 Type 2 diabetes mellitus with diabetic chronic kidney disease: Secondary | ICD-10-CM | POA: Diagnosis not present

## 2022-02-13 DIAGNOSIS — D631 Anemia in chronic kidney disease: Secondary | ICD-10-CM | POA: Diagnosis not present

## 2022-02-13 DIAGNOSIS — Z79891 Long term (current) use of opiate analgesic: Secondary | ICD-10-CM | POA: Diagnosis not present

## 2022-02-13 DIAGNOSIS — Z9981 Dependence on supplemental oxygen: Secondary | ICD-10-CM | POA: Diagnosis not present

## 2022-02-13 DIAGNOSIS — I5032 Chronic diastolic (congestive) heart failure: Secondary | ICD-10-CM | POA: Diagnosis not present

## 2022-02-13 DIAGNOSIS — I132 Hypertensive heart and chronic kidney disease with heart failure and with stage 5 chronic kidney disease, or end stage renal disease: Secondary | ICD-10-CM | POA: Diagnosis not present

## 2022-02-13 DIAGNOSIS — Z7952 Long term (current) use of systemic steroids: Secondary | ICD-10-CM | POA: Diagnosis not present

## 2022-02-13 DIAGNOSIS — E11621 Type 2 diabetes mellitus with foot ulcer: Secondary | ICD-10-CM | POA: Diagnosis not present

## 2022-02-13 DIAGNOSIS — Z792 Long term (current) use of antibiotics: Secondary | ICD-10-CM | POA: Diagnosis not present

## 2022-02-13 DIAGNOSIS — E1151 Type 2 diabetes mellitus with diabetic peripheral angiopathy without gangrene: Secondary | ICD-10-CM | POA: Diagnosis not present

## 2022-02-15 ENCOUNTER — Encounter: Payer: Self-pay | Admitting: Pulmonary Disease

## 2022-02-15 ENCOUNTER — Ambulatory Visit (INDEPENDENT_AMBULATORY_CARE_PROVIDER_SITE_OTHER): Payer: Medicare Other | Admitting: Pulmonary Disease

## 2022-02-15 ENCOUNTER — Encounter: Payer: Medicare Other | Admitting: Physician Assistant

## 2022-02-15 VITALS — BP 126/76 | HR 89 | Ht 64.0 in | Wt 262.0 lb

## 2022-02-15 DIAGNOSIS — I132 Hypertensive heart and chronic kidney disease with heart failure and with stage 5 chronic kidney disease, or end stage renal disease: Secondary | ICD-10-CM | POA: Diagnosis not present

## 2022-02-15 DIAGNOSIS — L97514 Non-pressure chronic ulcer of other part of right foot with necrosis of bone: Secondary | ICD-10-CM | POA: Diagnosis not present

## 2022-02-15 DIAGNOSIS — J4550 Severe persistent asthma, uncomplicated: Secondary | ICD-10-CM | POA: Diagnosis not present

## 2022-02-15 DIAGNOSIS — E11621 Type 2 diabetes mellitus with foot ulcer: Secondary | ICD-10-CM | POA: Diagnosis not present

## 2022-02-15 DIAGNOSIS — I5042 Chronic combined systolic (congestive) and diastolic (congestive) heart failure: Secondary | ICD-10-CM | POA: Diagnosis not present

## 2022-02-15 DIAGNOSIS — J40 Bronchitis, not specified as acute or chronic: Secondary | ICD-10-CM

## 2022-02-15 DIAGNOSIS — E1151 Type 2 diabetes mellitus with diabetic peripheral angiopathy without gangrene: Secondary | ICD-10-CM | POA: Diagnosis not present

## 2022-02-15 DIAGNOSIS — N186 End stage renal disease: Secondary | ICD-10-CM | POA: Diagnosis not present

## 2022-02-15 DIAGNOSIS — G4733 Obstructive sleep apnea (adult) (pediatric): Secondary | ICD-10-CM

## 2022-02-15 DIAGNOSIS — E1140 Type 2 diabetes mellitus with diabetic neuropathy, unspecified: Secondary | ICD-10-CM | POA: Diagnosis not present

## 2022-02-15 DIAGNOSIS — E1122 Type 2 diabetes mellitus with diabetic chronic kidney disease: Secondary | ICD-10-CM | POA: Diagnosis not present

## 2022-02-15 DIAGNOSIS — L97512 Non-pressure chronic ulcer of other part of right foot with fat layer exposed: Secondary | ICD-10-CM | POA: Diagnosis not present

## 2022-02-15 DIAGNOSIS — Z992 Dependence on renal dialysis: Secondary | ICD-10-CM | POA: Diagnosis not present

## 2022-02-15 DIAGNOSIS — M1990 Unspecified osteoarthritis, unspecified site: Secondary | ICD-10-CM | POA: Diagnosis not present

## 2022-02-15 DIAGNOSIS — E662 Morbid (severe) obesity with alveolar hypoventilation: Secondary | ICD-10-CM

## 2022-02-15 LAB — NITRIC OXIDE: Nitric Oxide: 17

## 2022-02-15 MED ORDER — ALBUTEROL SULFATE (2.5 MG/3ML) 0.083% IN NEBU: 2.5000 mg | INHALATION_SOLUTION | Freq: Four times a day (QID) | RESPIRATORY_TRACT | 11 refills | Status: DC | PRN

## 2022-02-15 MED ORDER — ALBUTEROL SULFATE HFA 108 (90 BASE) MCG/ACT IN AERS: 2.0000 | INHALATION_SPRAY | Freq: Four times a day (QID) | RESPIRATORY_TRACT | 2 refills | Status: DC | PRN

## 2022-02-15 MED ORDER — AZITHROMYCIN 250 MG PO TABS: ORAL_TABLET | ORAL | 0 refills | Status: AC

## 2022-02-15 NOTE — Progress Notes (Signed)
Linebaugh, [name] ([id number]) [id number] Page 1 of 5 Visit Report for 02/15/2022 Arrival Information Details Patient Name: Date of Service: [place], [name] 02/15/2022 9:30 A M Medical Record Number: [id number] Patient Account Number: [id number] Date of Birth/Sex: Treating RN: November 14, 1970 (51 y.o. [name] Primary Care Zuha Dejonge: Evern Bio Other Clinician: Massie Kluver Referring Cristine Daw: Treating Nereyda Bowler/Extender: Darien Ramus Weeks in Treatment: 80 Visit Information History Since Last Visit All ordered tests and consults were completed: No Patient Arrived: Wheel Chair Added or deleted any medications: No Arrival Time: 09:34 Any new allergies or adverse reactions: No Transfer Assistance: EasyPivot Patient Lift Had a fall or experienced change in No Patient Identification Verified: Yes activities of daily living that may affect Secondary Verification Process Completed: Yes risk of falls: Patient Requires Transmission-Based Precautions: No Signs or symptoms of abuse/neglect since No Patient Has Alerts: No last visito Hospitalized since last visit: No Implantable device outside of the clinic No excluding cellular tissue based products placed in the center since last visit: Has Dressing in Place as Prescribed: Yes Has Footwear/Offloading in Place as Yes Prescribed: Left: Surgical Shoe with Pressure Relief Insole Pain Present Now: Yes Electronic Signature(s) Unsigned Entered ByMassie Kluver on 02/15/2022 09:34:27 -------------------------------------------------------------------------------- Lower Extremity Assessment Details Patient Name: Date of Service: [place], [name] 02/15/2022 9:30 A M Medical Record Number: [id number] Patient Account Number: [id number] Date of Birth/Sex: Treating RN: December 28, 1970 (51 y.o. [name] Primary Care Reino Lybbert: Evern Bio Other Clinician: Massie Kluver Referring  Ara Grandmaison: Treating Solyana Nonaka/Extender: Darien Ramus Weeks in Treatment: [address], [place] ([id number]) [id number] Page 2 of 5 Electronic Signature(s) Unsigned Entered By: Massie Kluver on 02/15/2022 09:37:21 -------------------------------------------------------------------------------- Multi Wound Chart Details Patient Name: Date of Service: [place], [name] 02/15/2022 9:30 A M Medical Record Number: [id number] Patient Account Number: [id number] Date of Birth/Sex: Treating RN: 08/10/1970 (51 y.o. [name] Primary Care Suesan Mohrmann: Evern Bio Other Clinician: Massie Kluver Referring Lyell Clugston: Treating Lithzy Bernard/Extender: Darien Ramus Weeks in Treatment: 68 Vital Signs Height(in): Pulse(bpm): 98 Weight(lbs): Blood Pressure(mmHg): 170/72 Body Mass Index(BMI): Temperature(F): 98.4 Respiratory Rate(breaths/min): 18 [2:Photos:] [N/A:N/A] Right, Circumferential Foot N/A N/A Wound Location: Gradually Appeared N/A N/A Wounding Event: Diabetic Wound/Ulcer of the Lower N/A N/A Primary Etiology: Extremity Cataracts, Anemia, Asthma, Chronic N/A N/A Comorbid History: Obstructive Pulmonary Disease (COPD), Congestive Heart Failure, Hypertension, Type II Diabetes, End Stage Renal Disease, Osteoarthritis, Neuropathy 10/15/2020 N/A N/A Date Acquired: 45 N/A N/A Weeks of Treatment: Open N/A N/A Wound Status: No N/A N/A Wound Recurrence: Yes N/A N/A Pending A mputation on Presentation: 8.5x9x0.2 N/A N/A Measurements L x W x D (cm) 60.083 N/A N/A A (cm) : rea 12.017 N/A N/A Volume (cm) : 43.30% N/A N/A % Reduction in Area: 94.30% N/A N/A % Reduction in Volume: Hilyer, [name] ([id number]) [id number] Page 3 of 5 Grade 4 N/A N/A Classification: Medium N/A N/A Exudate A mount: Serosanguineous N/A N/A Exudate Type: red, brown N/A N/A Exudate Color: Medium (34-66%) N/A  N/A Granulation A mount: Medium (34-66%) N/A N/A Necrotic A mount: Eschar, Necrosis of Bone N/A N/A Necrotic Tissue: Fat Layer (Subcutaneous Tissue): Yes N/A N/A Exposed Structures: Fascia: No Tendon: No Muscle: No Joint: No Bone: No Treatment Notes Electronic Signature(s) Unsigned Entered ByMassie Kluver on 02/15/2022 09:37:25 -------------------------------------------------------------------------------- Pain Assessment Details Patient Name: Date of Service: [place], [name] 02/15/2022 9:30 A M Medical Record Number: [id number] Patient Account Number: [id number] Date of Birth/Sex: Treating RN: 02/21/1971 (50 y.o. F)  Cornell Barman Primary Care Meggen Spaziani: Evern Bio Other Clinician: Massie Kluver Referring Ashtin Melichar: Treating Gennaro Lizotte/Extender: Darien Ramus Weeks in Treatment: 28 Active Problems Location of Pain Severity and Description of Pain Patient Has Paino Yes Site Locations Pain Location: Pain in Ulcers Duration of the Pain. Constant / Intermittento Intermittent Rate the pain. Current Pain Level: 3 Character of Pain Describe the Pain: Sharp Pain Management and Medication Current Pain Management: Medication: Yes Cold Application: No Rest: Yes Massage: No Activity: No T.E.N.S.: No Mcnealy, [name] ([id number]) [id number] Page 4 of 5 Heat Application: No Leg drop or elevation: No Is the Current Pain Management Adequate: Inadequate How does your wound impact your activities of daily livingo Sleep: No Bathing: No Appetite: No Relationship With Others: No Bladder Continence: No Emotions: No Bowel Continence: No Work: No Toileting: No Drive: No Dressing: No Hobbies: No Electronic Signature(s) Unsigned Entered ByMassie Kluver on 02/15/2022 09:35:26 -------------------------------------------------------------------------------- Wound Assessment Details Patient Name: Date of Service: [place], [place] RLA  M. 02/15/2022 9:30 A M Medical Record Number: [id number] Patient Account Number: [id number] Date of Birth/Sex: Treating RN: [birth year] (51 y.o. [name] Primary Care Shoichi Mielke: Evern Bio Other Clinician: Massie Kluver Referring Janiene Aarons: Treating Erla Bacchi/Extender: Darien Ramus Weeks in Treatment: 39 Wound Status Wound Number: 2 Primary Diabetic Wound/Ulcer of the Lower Extremity Etiology: Wound Location: Right, Circumferential Foot Wound Open Wounding Event: Gradually Appeared Status: Date Acquired: 10/15/2020 Notes: open areas to midline right foot, lateral right foot, and top of right Weeks Of Treatment: 39 foot Clustered Wound: No Comorbid Cataracts, Anemia, Asthma, Chronic Obstructive Pulmonary Pending Amputation On Presentation History: Disease (COPD), Congestive Heart Failure, Hypertension, Type II Diabetes, End Stage Renal Disease, Osteoarthritis, Neuropathy Photos Wound Measurements Length: (cm) 8.5 Width: (cm) 9 Depth: (cm) 0.2 Area: (cm) 60.083 Volume: (cm) 12.017 % Reduction in Area: 43.3% % Reduction in Volume: 94.3% Tunneling: No Undermining: No Wound Description Classification: Grade 4 Jobst, Kalei M ([id number]) Exudate Amount: Medium Exudate Type: Serosanguineous Exudate Color: red, brown Foul Odor After Cleansing: No [id number] Page 5 of 5 Slough/Fibrino Yes Wound Bed Granulation Amount: Medium (34-66%) Exposed Structure Necrotic Amount: Medium (34-66%) Fascia Exposed: No Necrotic Quality: Eschar, Bone Fat Layer (Subcutaneous Tissue) Exposed: Yes Tendon Exposed: No Muscle Exposed: No Joint Exposed: No Bone Exposed: No Electronic Signature(s) Unsigned Entered ByMassie Kluver on 02/15/2022 09:37:07 -------------------------------------------------------------------------------- Vitals Details Patient Name: Date of Service: [place], [name] 02/15/2022 9:30 A M Medical Record Number:  [id number] Patient Account Number: [id number] Date of Birth/Sex: Treating RN: 08-10-1970 (50 y.o. [name] Primary Care Allis Quirarte: Evern Bio Other Clinician: Massie Kluver Referring Lafaye Mcelmurry: Treating Teodor Prater/Extender: Darien Ramus Weeks in Treatment: 55 Vital Signs Time Taken: 09:20 Temperature (F): 98.4 Pulse (bpm): 98 Respiratory Rate (breaths/min): 18 Blood Pressure (mmHg): 170/72 Reference Range: 80 - 120 mg / dl Electronic Signature(s) Unsigned Entered ByMassie Kluver on 02/15/2022 09:34:55 Signature(s): Date(s):

## 2022-02-15 NOTE — Progress Notes (Signed)
Summons, [name] ([id number]) [id number] Page 1 of 9 Visit Report for 02/15/2022 Chief Complaint Document Details Patient Name: Date of Service: [place], [name] 02/15/2022 9:30 A M Medical Record Number: [id number] Patient Account Number: [id number] Date of Birth/Sex: Treating RN: 13-Jul-1970 (51 y.o. [name] Primary Care Provider: Evern Bio Other Clinician: Massie Kluver Referring Provider: Treating Provider/Extender: Darien Ramus Weeks in Treatment: 36 Information Obtained from: Patient Chief Complaint Right foot gangrene and osteomyelitis Electronic Signature(s) Signed: 02/15/2022 9:33:24 AM By: Worthy Keeler PA-C Entered By: Worthy Keeler on 02/15/2022 09:33:24 -------------------------------------------------------------------------------- HPI Details Patient Name: Date of Service: [name], [name] 02/15/2022 9:30 A M Medical Record Number: [id number] Patient Account Number: [id number] Date of Birth/Sex: Treating RN: [birth year] (51 y.o. [name] Primary Care Provider: Evern Bio Other Clinician: Massie Kluver Referring Provider: Treating Provider/Extender: Darien Ramus Weeks in Treatment: 30 History of Present Illness HPI Description: 10/04/2020 upon evaluation today patient presents for initial inspection here in the clinic concerning a fairly concerning wound over her right fifth toe towards the medial aspect which actually probes all the way down to bone. There is evidence of necrotic muscle as well and I do see tendon exposed as well. Subsequently this does have me very concerned about where things stand here. Again this is the first time that I am seeing her and on top of everything else her ABIs are noncompressible. I think that arterial flow is the primary concern here. Of note the patient states this was first noted July 1. She had a x-ray on July 4 which was negative for bone  infection. She has been on 2 antibiotics she is unsure of the name. With that being said her most recent hemoglobin A1c was 8.1 that was December 2022. She otherwise does have a history obviously of diabetes mellitus type 2, hypertension, end-stage renal disease with dependence on renal dialysis, and she is also dependent on supplemental oxygen. Readmission: 05/14/2021 this is a patient whom I previously seen October 04, 2020. With that being said she is subsequently on 21 October 2020 ended up having an amputation of her fifth digit of her foot unfortunately. This had progressed fairly quickly into what sounds to been gangrene at that time. Subsequently right now based on what I am seeing the patient actually has a necrotic area on the end of her foot and has been recommended that the only option really here is going to be a amputation which would be a below-knee amputation. She is really not interested in that and wants to try to give this every chance it can to heal. For that reason I discussed with her that definitely we can see what we can do to try to improve this situation and try to help get this doing better as best we can. With that being said there is definitely a portion of her wound that is just not good to be able to heal no matter what it is already starting to auto amputate. Charters, [name] ([id number]) [id number] Page 2 of 9 This includes potentially digits of her foot as well based on what I am seeing. She does have confirmed osteomyelitis noted by MRI. She is also currently on IV vancomycin. She does undergo dialysis on Tuesday, Thursday, and Saturday. The patient does have a history of diabetes mellitus type 2, congestive heart failure, hypertension, end-stage renal disease, dependence on renal dialysis, and dependence on supplemental oxygen. With that being said I am  going to need to look into her heart failure and the significance of this as far as  hyperbarics is concerned before we initiate treatment. She is probably also going to need an updated EKG and chest x-ray unless its already been done recently. 05/28/2021 upon evaluation today patient appears to be doing well with regard to her wound all things considered. I am going to try to see what we can do about trimmed away some of the necrotic tissue that is really just hanging on here and there and not really helpful. Feel like if we can slowly start doing this maybe we will be able to get to the point where this is better and better as far as cleaning up the surface of the wound is concerned. Still as I explained I do believe some of her toes are not salvageable but again she is looking more towards autoamputation versus want to do anything definitive here from a surgical standpoint. I have been researching whether she would be an okay candidate for hyperbarics or not and I found that her x-ray but she had a chest x-ray recently was negative for any acute disease and appear to be stable. EKG was also stable. She did also have a cardiac echo which showed that her ejection fraction was 60 to 65% and therefore I think she is a candidate for hyperbarics. Again I do believe that this can help with preventing amputation which right now she has been told that her only option would be a below-knee amputation. Nonetheless I think that if we can get the heel leading to spread and the majority of this cleared away that it is possible we might even be able to get a surgeon just to surgically remove the nonviable toes and not have to go the full way of complete amputation. Nonetheless we do have a ways to go before we will get to that point. Either way I think the hyperbarics could be an excellent support as far as that is concerned. 06-05-2021 upon evaluation today patient's wound is showing some signs of improvement. This does seem to be slow and of course it is understandable considering what we are  seeing. She has discussed and looked up information about hyperbarics and in the end comes back today and states that she does want to proceed. I honestly do feel like that is her best chance at saving the foot. Even if we are able to get her into a surgeon after we obtain some good healing approximately so that there is not as much necrotic tissue in general she may even be able to have a transmetatarsal amputation and get this to heal. Again I am not saying this is can be an easy road but it may be her best road as far as thinking about how to achieve some type of complete healing as I do not believe the toes are viable at all to be honest. 07-06-2021 upon evaluation today patient appears to be doing well currently in regard to her foot all things considered. Again she unfortunately was unable to do the hyperbaric oxygen therapy this was due to her claustrophobia. Nonetheless she tells me that she is pleased with how the foot appears and she does feel like there is good improvement going on here. In general I think that we are on the right track. If she is going to take a very long time and to be honest the route of autoamputation which is pretty much the way this is  going is not a quick or easy road but nonetheless she seems to be taking care of her foot quite well. She is in good spirits. 07-27-2021 upon evaluation today patient appears to be doing well currently in regard to her foot all things considered. A lot of the eschar is starting to lift up around the edges which is good there is still significant necrotic tissue on the distal portion of the foot. This is slowly going to work its way off. Based on what I am seeing currently I do not believe that there is any evidence of infection which is good news. 08-31-2021 upon evaluation today patient's wound actually appears to be doing decently well. I do not see anything in particular to try to trim away today. With that being said she continues to  slowly allow this to do what is going to do as far as autoamputation is concerned. I did have another conversation with her today about the possibility of seeing a surgeon for a transmetatarsal amputation which again would help to get this to close faster and obviously I think would be a much speedy year and safer way to proceed with amputation. With that being said she tells me that she would really prefer to just let this go at "God pace". 09-28-2021 upon evaluation today patient's foot actually appears to be doing much better. Fortunately I do not see any signs of infection which is great news and overall I am extremely pleased with where we stand today. There does not appear to be any signs of active infection locally or systemically which is great news. No fevers, chills, nausea, vomiting, or diarrhea. 10-26-2021 upon evaluation today patient appears to be doing well currently in regard to her foot all things considered. Fortunately I do not see any evidence of active infection locally or systemically at this time which is great news. No fevers, chills, nausea, vomiting, or diarrhea. She does have some of the eschar that is lifting up even a little bit more compared to last time I saw her. 11-30-2021 upon evaluation today patient's wound is still continuing to have some areas that lift up although there is nothing really that I feel like I could trim away too much today to be honest. Fortunately I do not see any evidence of systemic infection though locally she has been concerned about some drainage I do not see anything that seems to be actively red or erythematous or warm to touch which is good news. No fevers, chills, nausea, vomiting, or diarrhea. 12-21-2021 upon evaluation patient's foot ulcer actually is showing signs of some improvement with healthier tissue noted at areas with eschar that keeps clearing off. There is a bit of the eschar that is loosening up today as well and I think we will get  a be able to get some of this cleared away that we have been working on for a while. Fortunately I do not see any evidence of infection locally or systemically at this time which is great news. No fevers, chills, nausea, vomiting, or diarrhea. 01-11-2022 upon evaluation today patient appears to be doing decently well in regard to her foot she still has a necrotic area on the tip of her foot but she is tolerating the dressing changes without complication. Fortunately there does not appear to be any signs of infection locally or systemically at this time which is great news. No fevers, chills, nausea, vomiting, or diarrhea. 02-01-2022 upon evaluation today patient continues to show signs of  improvement in regard to the area where we have been able to remove the eschar. Unfortunately the tip of the foot I think is the part that is completely dead and not going to be coming back as far as any healing is concerned. I have discussed this with the patient multiple times however she does not want to have any type of surgical intervention. Obviously in that regard we will try to do what we can as best we can. 02-15-2022 upon evaluation today patient appears to be doing well with regard to her foot. She is actually showing some signs of improvement she has had some drainage but nothing appears to be doing too poorly overall. I do not see any signs of infection and I am pleased in that regard. Fortunately I do not see any evidence of infection locally nor systemically which is great news. Electronic Signature(s) Signed: 02/15/2022 10:53:55 AM By: Worthy Keeler PA-C Entered By: Worthy Keeler on 02/15/2022 10:53:54 [place], [name] ([id number]) [id number] Page 3 of 9 -------------------------------------------------------------------------------- Physical Exam Details Patient Name: Date of Service: [name], [name] 02/15/2022 9:30 A M Medical Record Number: [id number] Patient  Account Number: [id number] Date of Birth/Sex: Treating RN: 11/11/1970 (51 y.o. [name] Primary Care Provider: Evern Bio Other Clinician: Massie Kluver Referring Provider: Treating Provider/Extender: Darien Ramus Weeks in Treatment: 3 Constitutional Well-nourished and well-hydrated in no acute distress. Respiratory normal breathing without difficulty. Psychiatric this patient is able to make decisions and demonstrates good insight into disease process. Alert and Oriented x 3. pleasant and cooperative. Notes Upon inspection patient's wound bed actually showed signs of good granulation and epithelization at this point. Fortunately I see no evidence of active infection at this time which is great and overall I do believe that we are headed in the right direction. No fevers, chills, nausea, vomiting, or diarrhea. Electronic Signature(s) Signed: 02/15/2022 10:54:10 AM By: Worthy Keeler PA-C Entered By: Worthy Keeler on 02/15/2022 10:54:10 -------------------------------------------------------------------------------- Physician Orders Details Patient Name: Date of Service: [place], [name] 02/15/2022 9:30 A M Medical Record Number: [id number] Patient Account Number: [id number] Date of Birth/Sex: Treating RN: [birth year] (51 y.o. [name] Primary Care Provider: Evern Bio Other Clinician: Massie Kluver Referring Provider: Treating Provider/Extender: Darien Ramus Weeks in Treatment: 59 Verbal / Phone Orders: No Diagnosis Coding ICD-10 Coding Code Description M86.371 Chronic multifocal osteomyelitis, right ankle and foot E11.621 Type 2 diabetes mellitus with foot ulcer L97.514 Non-pressure chronic ulcer of other part of right foot with necrosis of bone I50.42 Chronic combined systolic (congestive) and diastolic (congestive) heart failure I10 Essential (primary) hypertension N18.6 End stage renal disease Z99.2 Dependence on renal  dialysis [place], [name] ([id number]) [id number] Page 4 of 9 Z99.81 Dependence on supplemental oxygen Follow-up Appointments Return Appointment in 3 weeks. Nurse Visit as needed [facility] for wound care. May utilize formulary equivalent dressing for wound treatment orders unless otherwise specified. Home Health Nurse may visit [facility] to address patients wound care needs. Scheduled days for dressing changes to be completed; exception, patient has scheduled wound care visit that day. **Please direct any NON-WOUND related issues/requests for orders to patient's Primary Care Physician. **If current dressing causes regression in wound condition, may D/C ordered dressing product/s and apply Normal Saline Moist Dressing daily until next [place] or Other MD appointment. **Notify [facility] of regression in wound condition at [phone]. Bathing/ Passenger transport manager  wound with Normal Saline or wound cleanser. Wash wounds with antibacterial soap and water. May shower; gently cleanse wound with antibacterial soap, rinse and pat dry prior to dressing wounds No tub bath. Off-Loading Open toe surgical shoe Wound Treatment Wound #2 - Foot Wound Laterality: Right, Circumferential Cleanser: Normal Saline 1 x Per Day/30 Days Discharge Instructions: Wash your hands with soap and water. Remove old dressing, discard into plastic bag and place into trash. Cleanse the wound with Normal Saline prior to applying a clean dressing using gauze sponges, not tissues or cotton balls. Do not scrub or use excessive force. Pat dry using gauze sponges, not tissue or cotton balls. Cleanser: Soap and Water 1 x Per Day/30 Days Discharge Instructions: Gently cleanse wound with antibacterial soap, rinse and pat dry prior to dressing wounds Topical: Betadine 1 x Per Day/30 Days Discharge Instructions: apply gauze soaked  dressing to top of foot and toes Prim Dressing: Gauze 1 x Per Day/30 Days ary Discharge Instructions: As directed: betadine applied Prim Dressing: Prisma 4.34 (in) 1 x Per Day/30 Days ary Discharge Instructions: Moisten w/normal saline or sterile water; Cover open areas of wound as directed Do not remove from wound bed. Secondary Dressing: ABD Pad 5x9 (in/in) 1 x Per Day/30 Days Discharge Instructions: Cover with ABD pad Secured With: Medipore T - 26M Medipore H Soft Cloth Surgical T ape ape, 2x2 (in/yd) 1 x Per Day/30 Days Secured With: Kerlix Roll Sterile or Non-Sterile 6-ply 4.5x4 (yd/yd) 1 x Per Day/30 Days Discharge Instructions: Apply Kerlix as directed Secured With: Stretch Net Dressing, Latex-free, Size 5, Small-Head / Shoulder / Thigh 1 x Per Day/30 Days Electronic Signature(s) Signed: 02/15/2022 1:07:55 PM By: Worthy Keeler PA-C Signed: 02/20/2022 9:41:34 AM By: Massie Kluver Entered By: Massie Kluver on 02/15/2022 09:57:04 -------------------------------------------------------------------------------- Problem List Details Patient Name: Date of Service: [name], [name] 02/15/2022 9:30 A M Medical Record Number: [id number] Patient Account Number: [id number] Date of Birth/Sex: Treating RN: [birth year] (50 y.o. [address] [place], [place] ([id number]) [id number] Page 5 of 9 Primary Care Provider: Evern Bio Other Clinician: Massie Kluver Referring Provider: Treating Provider/Extender: Darien Ramus Weeks in Treatment: 43 Active Problems ICD-10 Encounter Code Description Active Date MDM Diagnosis M86.371 Chronic multifocal osteomyelitis, right ankle and foot 05/18/2021 No Yes E11.621 Type 2 diabetes mellitus with foot ulcer 05/18/2021 No Yes L97.514 Non-pressure chronic ulcer of other part of right foot with necrosis of bone 05/18/2021 No Yes I50.42 Chronic combined systolic (congestive) and diastolic (congestive) heart  failure 05/18/2021 No Yes I10 Essential (primary) hypertension 05/18/2021 No Yes N18.6 End stage renal disease 05/18/2021 No Yes Z99.2 Dependence on renal dialysis 05/18/2021 No Yes Z99.81 Dependence on supplemental oxygen 05/18/2021 No Yes Inactive Problems Resolved Problems Electronic Signature(s) Signed: 02/15/2022 9:33:18 AM By: Worthy Keeler PA-C Entered By: Worthy Keeler on 02/15/2022 09:33:18 -------------------------------------------------------------------------------- Progress Note Details Patient Name: Date of Service: [place], [name] 02/15/2022 9:30 A M Medical Record Number: [id number] Patient Account Number: [id number] Date of Birth/Sex: Treating RN: [birth year] (51 y.o. [name] Primary Care Provider: Evern Bio Other Clinician: Massie Kluver Referring Provider: Treating Provider/Extender: Darien Ramus Weeks in Treatment: [address] [name] ([id number]) [id number] Page 6 of 9 Chief Complaint Information obtained from Patient Right foot gangrene and osteomyelitis History of Present Illness (HPI) 10/04/2020 upon evaluation today patient presents for initial inspection here in the clinic concerning a fairly concerning wound over her right fifth toe towards the medial aspect  which actually probes all the way down to bone. There is evidence of necrotic muscle as well and I do see tendon exposed as well. Subsequently this does have me very concerned about where things stand here. Again this is the first time that I am seeing her and on top of everything else her ABIs are noncompressible. I think that arterial flow is the primary concern here. Of note the patient states this was first noted July 1. She had a x-ray on July 4 which was negative for bone infection. She has been on 2 antibiotics she is unsure of the name. With that being said her most recent hemoglobin A1c was 8.1 that was December 2022. She  otherwise does have a history obviously of diabetes mellitus type 2, hypertension, end-stage renal disease with dependence on renal dialysis, and she is also dependent on supplemental oxygen. Readmission: 05/14/2021 this is a patient whom I previously seen October 04, 2020. With that being said she is subsequently on 21 October 2020 ended up having an amputation of her fifth digit of her foot unfortunately. This had progressed fairly quickly into what sounds to been gangrene at that time. Subsequently right now based on what I am seeing the patient actually has a necrotic area on the end of her foot and has been recommended that the only option really here is going to be a amputation which would be a below-knee amputation. She is really not interested in that and wants to try to give this every chance it can to heal. For that reason I discussed with her that definitely we can see what we can do to try to improve this situation and try to help get this doing better as best we can. With that being said there is definitely a portion of her wound that is just not good to be able to heal no matter what it is already starting to auto amputate. This includes potentially digits of her foot as well based on what I am seeing. She does have confirmed osteomyelitis noted by MRI. She is also currently on IV vancomycin. She does undergo dialysis on Tuesday, Thursday, and Saturday. The patient does have a history of diabetes mellitus type 2, congestive heart failure, hypertension, end-stage renal disease, dependence on renal dialysis, and dependence on supplemental oxygen. With that being said I am going to need to look into her heart failure and the significance of this as far as hyperbarics is concerned before we initiate treatment. She is probably also going to need an updated EKG and chest x-ray unless its already been done recently. 05/28/2021 upon evaluation today patient appears to be doing well with regard to her  wound all things considered. I am going to try to see what we can do about trimmed away some of the necrotic tissue that is really just hanging on here and there and not really helpful. Feel like if we can slowly start doing this maybe we will be able to get to the point where this is better and better as far as cleaning up the surface of the wound is concerned. Still as I explained I do believe some of her toes are not salvageable but again she is looking more towards autoamputation versus want to do anything definitive here from a surgical standpoint. I have been researching whether she would be an okay candidate for hyperbarics or not and I found that her x-ray but she had a chest x-ray recently was negative for any acute disease  and appear to be stable. EKG was also stable. She did also have a cardiac echo which showed that her ejection fraction was 60 to 65% and therefore I think she is a candidate for hyperbarics. Again I do believe that this can help with preventing amputation which right now she has been told that her only option would be a below-knee amputation. Nonetheless I think that if we can get the heel leading to spread and the majority of this cleared away that it is possible we might even be able to get a surgeon just to surgically remove the nonviable toes and not have to go the full way of complete amputation. Nonetheless we do have a ways to go before we will get to that point. Either way I think the hyperbarics could be an excellent support as far as that is concerned. 06-05-2021 upon evaluation today patient's wound is showing some signs of improvement. This does seem to be slow and of course it is understandable considering what we are seeing. She has discussed and looked up information about hyperbarics and in the end comes back today and states that she does want to proceed. I honestly do feel like that is her best chance at saving the foot. Even if we are able to get her into a  surgeon after we obtain some good healing approximately so that there is not as much necrotic tissue in general she may even be able to have a transmetatarsal amputation and get this to heal. Again I am not saying this is can be an easy road but it may be her best road as far as thinking about how to achieve some type of complete healing as I do not believe the toes are viable at all to be honest. 07-06-2021 upon evaluation today patient appears to be doing well currently in regard to her foot all things considered. Again she unfortunately was unable to do the hyperbaric oxygen therapy this was due to her claustrophobia. Nonetheless she tells me that she is pleased with how the foot appears and she does feel like there is good improvement going on here. In general I think that we are on the right track. If she is going to take a very long time and to be honest the route of autoamputation which is pretty much the way this is going is not a quick or easy road but nonetheless she seems to be taking care of her foot quite well. She is in good spirits. 07-27-2021 upon evaluation today patient appears to be doing well currently in regard to her foot all things considered. A lot of the eschar is starting to lift up around the edges which is good there is still significant necrotic tissue on the distal portion of the foot. This is slowly going to work its way off. Based on what I am seeing currently I do not believe that there is any evidence of infection which is good news. 08-31-2021 upon evaluation today patient's wound actually appears to be doing decently well. I do not see anything in particular to try to trim away today. With that being said she continues to slowly allow this to do what is going to do as far as autoamputation is concerned. I did have another conversation with her today about the possibility of seeing a surgeon for a transmetatarsal amputation which again would help to get this to close  faster and obviously I think would be a much speedy year and safer way to  proceed with amputation. With that being said she tells me that she would really prefer to just let this go at "God pace". 09-28-2021 upon evaluation today patient's foot actually appears to be doing much better. Fortunately I do not see any signs of infection which is great news and overall I am extremely pleased with where we stand today. There does not appear to be any signs of active infection locally or systemically which is great news. No fevers, chills, nausea, vomiting, or diarrhea. 10-26-2021 upon evaluation today patient appears to be doing well currently in regard to her foot all things considered. Fortunately I do not see any evidence of active infection locally or systemically at this time which is great news. No fevers, chills, nausea, vomiting, or diarrhea. She does have some of the eschar that is lifting up even a little bit more compared to last time I saw her. 11-30-2021 upon evaluation today patient's wound is still continuing to have some areas that lift up although there is nothing really that I feel like I could trim away too much today to be honest. Fortunately I do not see any evidence of systemic infection though locally she has been concerned about some drainage I do not see anything that seems to be actively red or erythematous or warm to touch which is good news. No fevers, chills, nausea, vomiting, or diarrhea. 12-21-2021 upon evaluation patient's foot ulcer actually is showing signs of some improvement with healthier tissue noted at areas with eschar that keeps clearing off. There is a bit of the eschar that is loosening up today as well and I think we will get a be able to get some of this cleared away that we have been working on for a while. Fortunately I do not see any evidence of infection locally or systemically at this time which is great news. No fevers, chills, nausea, vomiting, or  diarrhea. 01-11-2022 upon evaluation today patient appears to be doing decently well in regard to her foot she still has a necrotic area on the tip of her foot but she is tolerating the dressing changes without complication. Fortunately there does not appear to be any signs of infection locally or systemically at this time which is great news. No fevers, chills, nausea, vomiting, or diarrhea. 02-01-2022 upon evaluation today patient continues to show signs of improvement in regard to the area where we have been able to remove the eschar. Unfortunately the tip of the foot I think is the part that is completely dead and not going to be coming back as far as any healing is concerned. I have discussed this with the patient multiple times however she does not want to have any type of surgical intervention. Obviously in that regard we will try to do what we can as best we can. [name], [name] ([id number]) [id number] Page 7 of 9 02-15-2022 upon evaluation today patient appears to be doing well with regard to her foot. She is actually showing some signs of improvement she has had some drainage but nothing appears to be doing too poorly overall. I do not see any signs of infection and I am pleased in that regard. Fortunately I do not see any evidence of infection locally nor systemically which is great news. Objective Constitutional Well-nourished and well-hydrated in no acute distress. Vitals Time Taken: 9:20 AM, Temperature: 98.4 F, Pulse: 98 bpm, Respiratory Rate: 18 breaths/min, Blood Pressure: 170/72 mmHg. Respiratory normal breathing without difficulty. Psychiatric this patient is able to make  decisions and demonstrates good insight into disease process. Alert and Oriented x 3. pleasant and cooperative. General Notes: Upon inspection patient's wound bed actually showed signs of good granulation and epithelization at this point. Fortunately I see no evidence of active  infection at this time which is great and overall I do believe that we are headed in the right direction. No fevers, chills, nausea, vomiting, or diarrhea. Integumentary (Hair, Skin) Wound #2 status is Open. Original cause of wound was Gradually Appeared. The date acquired was: 10/15/2020. The wound has been in treatment 39 weeks. The wound is located on the Right,Circumferential Foot. The wound measures 8.5cm length x 9cm width x 0.2cm depth; 60.083cm^2 area and 12.017cm^3 volume. There is Fat Layer (Subcutaneous Tissue) exposed. There is no tunneling or undermining noted. There is a medium amount of serosanguineous drainage noted. There is medium (34-66%) granulation within the wound bed. There is a medium (34-66%) amount of necrotic tissue within the wound bed including Eschar and Necrosis of Bone. 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 Nurse Visit as needed Home Health: [facility] for wound care. May utilize formulary equivalent dressing for wound treatment orders unless otherwise specified. Home Health Nurse may visit PRN to address patientoos wound care needs. Scheduled days for dressing changes to be completed; exception, patient has scheduled wound care visit that day. **Please direct any NON-WOUND related issues/requests for orders to patient's Primary Care Physician. **If current dressing causes regression in wound condition, may D/C ordered dressing product/s and apply Normal Saline Moist Dressing daily until next  [place] or Other MD appointment. **Notify [facility] of regression in wound condition at [phone]. Bathing/ Shower/ Hygiene: Clean wound with Normal Saline or wound cleanser. Wash wounds with antibacterial soap and water. May shower; gently cleanse wound with antibacterial soap, rinse and pat dry prior to dressing wounds No tub bath. Off-Loading: Open toe surgical shoe WOUND #2: - Foot Wound Laterality: Right, Circumferential Cleanser: Normal Saline 1 x Per Day/30 Days Discharge Instructions: Wash your hands with soap and water. Remove old dressing, discard into plastic bag and place into trash. Cleanse the wound with Normal Saline prior to applying a clean dressing using gauze sponges, not tissues or cotton balls. Do not scrub or use excessive force. Pat dry using gauze sponges, not tissue or cotton balls. Cleanser: Soap and Water 1 x Per Day/30 Days Discharge Instructions: Gently cleanse wound with antibacterial soap, rinse and pat dry prior to dressing wounds Topical: Betadine 1 x Per Day/30 Days Discharge Instructions: apply gauze soaked dressing to top of foot and toes [name], [name] ([id number]) [id number] Page 8 of 9 Prim Dressing: Gauze 1 x Per Day/30 Days ary Discharge Instructions: As directed: betadine applied Prim Dressing: Prisma 4.34 (in) 1 x Per Day/30 Days ary Discharge Instructions: Moisten w/normal saline or sterile water; Cover open areas of wound as directed Do not remove from wound bed. Secondary Dressing: ABD Pad 5x9 (in/in) 1 x Per Day/30 Days Discharge Instructions: Cover with ABD pad Secured With: Medipore T - 13M Medipore H Soft Cloth Surgical T ape ape, 2x2 (in/yd) 1 x Per Day/30 Days  Secured With: [employer] or Non-Sterile 6-ply 4.5x4 (yd/yd) 1 x Per Day/30 Days Discharge Instructions: Apply Kerlix as directed Secured With: Stretch Net Dressing, Latex-free, Size 5, Small-Head / Shoulder / Thigh  1 x Per Day/30 Days 1. I do believe that we should continue with the Betadine to the end of the foot although recommend continue with the collagen to the proximal portion she does have some new skin growth compared to last time I am hopeful this will continue to improve. 2. Will continue with ABD pad to cover followed by roll gauze and secure in place. 3. She is also going to continue to stay off of this she has been doing this for quite some time and overall I think that still going to be primarily what needs to be done at this point. We will see patient back for reevaluation in 1 week here in the clinic. If anything worsens or changes patient will contact our office for additional recommendations. Electronic Signature(s) Signed: 02/15/2022 10:54:41 AM By: Worthy Keeler PA-C Entered By: Worthy Keeler on 02/15/2022 10:54:40 -------------------------------------------------------------------------------- SuperBill Details Patient Name: Date of Service: [place], [name] 02/15/2022 Medical Record Number: [id number] Patient Account Number: [id number] Date of Birth/Sex: Treating RN: 10-Mar-1970 (51 y.o. [name] Primary Care Provider: Evern Bio Other Clinician: Massie Kluver Referring Provider: Treating Provider/Extender: Darien Ramus Weeks in Treatment: 39 Diagnosis Coding ICD-10 Codes Code Description M86.371 Chronic multifocal osteomyelitis, right ankle and foot E11.621 Type 2 diabetes mellitus with foot ulcer L97.514 Non-pressure chronic ulcer of other part of right foot with necrosis of bone I50.42 Chronic combined systolic (congestive) and diastolic (congestive) heart failure I10 Essential (primary) hypertension N18.6 End stage renal disease Z99.2 Dependence on renal dialysis Z99.81 Dependence on supplemental oxygen Facility Procedures : 7 CPT4 Code: 9373428 Description: 99213 - WOUND CARE VISIT-LEV 3 EST PT Modifier: Quantity: 1 Physician  Procedures : CPT4 Code Description Modifier [id number] - WC PHYS LEVEL 3 - EST PT ICD-10 Diagnosis Description M86.371 Chronic multifocal osteomyelitis, right ankle and foot E11.621 Type 2 diabetes mellitus with foot ulcer L97.514 Non-pressure chronic ulcer of  other part of right foot with necrosis of bone Ozment, [name] ([id number]) [id number] Pag I50.42 Chronic combined systolic (congestive) and diastolic (congestive) heart failure Quantity: 1 e 9 of 9 Electronic Signature(s) Signed: 02/15/2022 11:01:14 AM By: Worthy Keeler PA-C Entered By: Worthy Keeler on 02/15/2022 11:01:14

## 2022-02-15 NOTE — Patient Instructions (Addendum)
I have sent an antibiotic to your pharmacy for your bronchitis.  I have recommended that you get an AirFit F30 mask this will likely be more comfortable for you to use your BiPAP.  Use your BiPAP nightly.  You you can use Mucinex DM maximum strength twice a day for your cough.  We will see you in follow-up in 3 to 4 months time call sooner should any new problems arise.

## 2022-02-15 NOTE — Progress Notes (Signed)
Subjective:    Patient ID: [name], female    DOB: [birth year], 51 y.o.   MRN: [id number] Patient Care Team: Fayrene Helper, NP as PCP - General (Nurse Practitioner) Antonieta Iba, MD as Consulting Physician (Cardiology) Delma Freeze, FNP as Nurse Practitioner (Family Medicine) Salena Saner, MD as Consulting Physician (Pulmonary Disease)  Chief Complaint  Patient presents with   Follow-up    DOE. Wheezing. Cough with clear sputum.   HPI Patient is a 51 year old morbidly obese lifelong never smoker, with a history of ESRD who presents for follow-up on the issue of asthma and shortness of breath.  She was last seen here on 19 November 2021, this is a scheduled visit.  The patient also has SEVERE sleep apnea noted by sleep study of 09 April 2021.  Patient had declined an in lab sleep study however during an admission in March 2023 to [facility] it was noted that she also had obesity with alveolar hypoventilation and she was set up with BiPAP.  Patient has had difficulties complying with BiPAP due to difficulties with tolerating the mask.  She feels that if she had a better fitting mask she would do better.  He feels she would do better with a nasal mask.  I have recommended that she contact her DME company so that they can fit her with the appropriate mask.  Also advised her of the consequences of untreated sleep apnea particularly with the severity of her sleep apnea.  She has significant pulmonary hypertension and untreated sleep apnea will make this issue worse.  He did undergo sleep titration study on 13 February 2022.  She was titrated to BiPAP of 18 over 11 cm H2O and a AirFit mask was recommended.  Because of her issues with complex sleep apnea she was set up with a VPAP auto with minimum EPAP 8 cm H2O max IPAP 25 cm H2O pressure support of 8 cm H2O. her compliance with this still leaves to be desired however she states that she continue to try and increase compliance with  the device.    Her main concerns today are nasal congestion and a congested cough.  She believes it is due to "allergies" she has not had any fevers, chills or sweats.  No chest pain.  No orthopnea or paroxysmal nocturnal dyspnea.  He has had cough productive of yellowish to greenish sputum.  He needs refills on her albuterol.  She continues to follow with the wound clinic for right foot wound/gangrene.    She is maintained on Perforomist,Pulmicort and Yupelri for her chronic asthmatic bronchitis.  Has albuterol as needed.   Review of Systems A 10 point review of systems was performed and it is as noted above otherwise negative.  Patient Active Problem List   Diagnosis Date Noted   SOB (shortness of breath)    History of osteomyelitis    RUQ pain    Status post thoracentesis    Acute hematogenous osteomyelitis of right foot (HCC)    Pancreatitis 04/21/2021   Metabolic acidosis, increased anion gap 04/21/2021   GERD (gastroesophageal reflux disease) 04/21/2021   Acute on chronic diastolic CHF (congestive heart failure) (HCC) 11/14/2020   Anemia of chronic disease    Hyponatremia    Gangrene (HCC)    PVD (peripheral vascular disease) (HCC)    Aspiration into airway    Hearing loss of right ear    Diabetic foot ulcer (HCC) 10/15/2020   Decreased pedal pulses 10/15/2020  Chronic respiratory failure with hypoxia (HCC)    Extreme obesity with alveolar hypoventilation (HCC)    Palliative care by specialist    Goals of care, counseling/discussion    ESRD (end stage renal disease) (HCC)    Adjustment disorder with mixed anxiety and depressed mood 03/31/2017   Dysthymia 03/31/2017   Chronic pain syndrome 03/31/2017   Palliative care encounter    Obesity hypoventilation syndrome (HCC) 03/09/2017   Chronic diastolic heart failure (HCC) 01/07/2017   Depression 09/24/2016   Acute on chronic diastolic heart failure (HCC) 07/04/2016   COPD with chronic bronchitis 07/04/2016   Acute on  chronic respiratory failure (HCC) 06/26/2016   CKD (chronic kidney disease), stage III (HCC) 06/26/2016   COPD exacerbation (HCC) 03/25/2016   Anxiety 10/02/2015   Asthma 08/30/2015   Type 2 diabetes mellitus with ESRD (end-stage renal disease) (HCC) 03/13/2015   Iron deficiency anemia 02/17/2015   Obesity, Class III, BMI 40-49.9 (morbid obesity) (HCC)    Shortness of breath    Essential hypertension    Hypertensive heart disease with congestive heart failure (HCC) 12/18/2014   Diabetes mellitus with ESRD (end-stage renal disease) (HCC) 12/15/2014   Social History   Tobacco Use   Smoking status: Never    Passive exposure: Past   Smokeless tobacco: Never  Substance Use Topics   Alcohol use: Not Currently    Comment: occasional   Allergies  Allergen Reactions   Dust Mite Extract Shortness Of Breath and Itching   Mold Extract [Trichophyton] Shortness Of Breath and Itching   Pollen Extract Shortness Of Breath    Itching, shortness of breath, asthma like symptoms   Sulfa Antibiotics Itching   Misc. Sulfonamide Containing Compounds    Feraheme [Ferumoxytol] Itching    Uses benadryl so she can get the medicine   Current Meds  Medication Sig   acetaminophen (TYLENOL) 500 MG tablet Take 1,000 mg by mouth every 6 (six) hours as needed for mild pain, fever or headache.   albuterol (PROVENTIL) (2.5 MG/3ML) 0.083% nebulizer solution Take 3 mLs (2.5 mg total) by nebulization every 6 (six) hours as needed for wheezing or shortness of breath.   albuterol (VENTOLIN HFA) 108 (90 Base) MCG/ACT inhaler Inhale 2 puffs into the lungs every 6 (six) hours as needed for wheezing or shortness of breath.   aspirin EC 81 MG tablet Take 81 mg by mouth daily.    atorvastatin (LIPITOR) 40 MG tablet Take 1 tablet (40 mg total) by mouth daily.   b complex-vitamin c-folic acid (NEPHRO-VITE) 0.8 MG TABS tablet Take 1 tablet by mouth at bedtime.   budesonide (PULMICORT) 0.25 MG/2ML nebulizer solution Take 2  mLs (0.25 mg total) by nebulization 2 (two) times daily.   budesonide (PULMICORT) 0.5 MG/2ML nebulizer solution Take by nebulization.   Cholecalciferol 100 MCG (4000 UT) CAPS Take 1 capsule by mouth once a week.   clindamycin (CLEOCIN T) 1 % lotion Apply 1 application topically daily.   ferric citrate (AURYXIA) 1 GM 210 MG(Fe) tablet Take 210 mg by mouth 3 (three) times daily with meals. Only taking once a day most days   formoterol (PERFOROMIST) 20 MCG/2ML nebulizer solution    insulin aspart (NOVOLOG) 100 UNIT/ML injection Inject 0-15 Units into the skin 3 (three) times daily with meals.   insulin degludec (TRESIBA) 100 UNIT/ML FlexTouch Pen Inject 30 Units into the skin daily.   insulin lispro (HUMALOG) 100 UNIT/ML KwikPen Inject into the skin.   lactulose (CHRONULAC) 10 GM/15ML solution Take 30  mLs (20 g total) by mouth daily as needed for severe constipation.   lidocaine-prilocaine (EMLA) cream Apply topically as needed (For access for dialysis).   LINZESS 290 MCG CAPS capsule Take 290 mcg by mouth every morning.   meloxicam (MOBIC) 15 MG tablet Take 1 tablet (15 mg total) by mouth daily as needed for pain.   metoprolol tartrate (LOPRESSOR) 50 MG tablet Take 1 tablet (50 mg total) by mouth 2 (two) times daily.   midodrine (PROAMATINE) 10 MG tablet Take 20 mg by mouth daily as needed.   montelukast (SINGULAIR) 10 MG tablet Take 1 tablet (10 mg total) by mouth at bedtime.   Omeprazole Magnesium (PRILOSEC PO)    OXYGEN Inhale 2-4 L into the lungs.   pregabalin (LYRICA) 50 MG capsule Take 100 mg by mouth every 8 (eight) hours as needed.   RENVELA 2.4 g PACK Take 2.4 g by mouth 3 (three) times daily.   revefenacin (YUPELRI) 175 MCG/3ML nebulizer solution    Immunization History  Administered Date(s) Administered   Hepatitis B, adult 09/26/2018   Influenza,inj,Quad PF,6+ Mos 11/14/2016, 01/02/2021   Influenza-Unspecified 11/14/2016, 12/27/2017, 10/16/2018, 11/05/2018   PPD Test 04/15/2017,  05/06/2017, 05/15/2017, 04/28/2018, 04/13/2019, 06/01/2020   Pneumococcal Polysaccharide-23 04/29/2017       Objective:   Physical Exam BP 126/76 (BP Location: Right Arm, Cuff Size: Large)   Pulse 89   Ht 5\' 4"  (1.626 m)   Wt 262 lb (118.8 kg) Comment: last recorded weight. patient in a wheelchair  LMP 10/14/2012 (Approximate)   SpO2 90%   BMI 44.97 kg/m   SpO2: 90 % O2 Device: Nasal cannula O2 Flow Rate (L/min): 3 L/min  GENERAL: Chronically ill-appearing morbidly obese woman in no acute respiratory distress.  She presents in transport chair..  She is wearing oxygen via nasal cannula at 2 L/min.  HEAD: Normocephalic, atraumatic.  EYES: Pupils equal, round, reactive to light.  No scleral icterus.  MOUTH: Oral mucosa moist no thrush. NECK: Thick, circumference 19 inches, supple. No thyromegaly. Trachea midline. No JVD.  No adenopathy. PULMONARY: Symmetrical air entry.  Coarse breath sounds but no wheezes noted.  Moving air fairly well. CARDIOVASCULAR: S1 and S2. Regular rate and rhythm.  No rubs or gallops heard.  Grade 2/6 systolic ejection murmur radiating to axilla. GASTROINTESTINAL: Obese abdomen, nondistended. MUSCULOSKELETAL: AV fistula left upper extremity with good thrill.  Right foot in orthopedic shoe.  Continues to see wound clinic. NEUROLOGIC: Awake, alert, no overt focal deficits.  Speech is fluent.  Gait not assessed. SKIN: Intact,warm,dry.  No overt rashes noted. PSYCH: Mood and behavior appropriate.  Insight: Poor  Lab Results  Component Value Date   NITRICOXIDE 17 02/15/2022        Assessment & Plan:     ICD-10-CM   1. Severe persistent asthmatic bronchitis without complication  J45.50 Nitric oxide   Continue Perforomist, Pulmicort and Yupelri He has needed albuterol Refilled albuterol    2. OSA (obstructive sleep apnea)  G47.33 AMB REFERRAL FOR DME   Very severe On auto VPAP Needs to improve compliance    3. Tracheobronchitis  J40    Azithromycin  Z-Pak    4. Obesity hypoventilation syndrome (HCC)  E66.2    This issue at complexity to her management Adds to the severity and complexity of her sleep apnea     Orders Placed This Encounter  Procedures   AMB REFERRAL FOR DME    Referral Priority:   Routine    Referral Type:  Durable Medical Equipment Purchase    Number of Visits Requested:   1   Nitric oxide   Meds ordered this encounter  Medications   albuterol (PROVENTIL) (2.5 MG/3ML) 0.083% nebulizer solution    Sig: Take 3 mLs (2.5 mg total) by nebulization every 6 (six) hours as needed for wheezing or shortness of breath.    Dispense:  75 mL    Refill:  11   albuterol (VENTOLIN HFA) 108 (90 Base) MCG/ACT inhaler    Sig: Inhale 2 puffs into the lungs every 6 (six) hours as needed for wheezing or shortness of breath.    Dispense:  18 g    Refill:  2   azithromycin (ZITHROMAX) 250 MG tablet    Sig: Take 2 tablets (500 mg) on  Day 1,  followed by 1 tablet (250 mg) once daily on Days 2 through 5.    Dispense:  6 each    Refill:  0   See the patient in follow-up in 3 to 4 months time she is to contact us prior to that time should any new problems arise.  Gailen Shelter, MD Advanced Bronchoscopy PCCM [facility]    *This note was dictated using voice recognition software/Dragon.  Despite best efforts to proofread, errors can occur which can change the meaning. Any transcriptional errors that result from this process are unintentional and may not be fully corrected at the time of dictation.

## 2022-02-16 DIAGNOSIS — Z992 Dependence on renal dialysis: Secondary | ICD-10-CM | POA: Diagnosis not present

## 2022-02-16 DIAGNOSIS — N186 End stage renal disease: Secondary | ICD-10-CM | POA: Diagnosis not present

## 2022-02-17 DIAGNOSIS — J9611 Chronic respiratory failure with hypoxia: Secondary | ICD-10-CM | POA: Diagnosis not present

## 2022-02-17 DIAGNOSIS — I5032 Chronic diastolic (congestive) heart failure: Secondary | ICD-10-CM | POA: Diagnosis not present

## 2022-02-17 DIAGNOSIS — J449 Chronic obstructive pulmonary disease, unspecified: Secondary | ICD-10-CM | POA: Diagnosis not present

## 2022-02-19 DIAGNOSIS — Z992 Dependence on renal dialysis: Secondary | ICD-10-CM | POA: Diagnosis not present

## 2022-02-19 DIAGNOSIS — N186 End stage renal disease: Secondary | ICD-10-CM | POA: Diagnosis not present

## 2022-02-20 DIAGNOSIS — Z792 Long term (current) use of antibiotics: Secondary | ICD-10-CM | POA: Diagnosis not present

## 2022-02-20 DIAGNOSIS — Z79891 Long term (current) use of opiate analgesic: Secondary | ICD-10-CM | POA: Diagnosis not present

## 2022-02-20 DIAGNOSIS — I132 Hypertensive heart and chronic kidney disease with heart failure and with stage 5 chronic kidney disease, or end stage renal disease: Secondary | ICD-10-CM | POA: Diagnosis not present

## 2022-02-20 DIAGNOSIS — Z7952 Long term (current) use of systemic steroids: Secondary | ICD-10-CM | POA: Diagnosis not present

## 2022-02-20 DIAGNOSIS — Z7951 Long term (current) use of inhaled steroids: Secondary | ICD-10-CM | POA: Diagnosis not present

## 2022-02-20 DIAGNOSIS — N186 End stage renal disease: Secondary | ICD-10-CM | POA: Diagnosis not present

## 2022-02-20 DIAGNOSIS — Z794 Long term (current) use of insulin: Secondary | ICD-10-CM | POA: Diagnosis not present

## 2022-02-20 DIAGNOSIS — E11621 Type 2 diabetes mellitus with foot ulcer: Secondary | ICD-10-CM | POA: Diagnosis not present

## 2022-02-20 DIAGNOSIS — L97513 Non-pressure chronic ulcer of other part of right foot with necrosis of muscle: Secondary | ICD-10-CM | POA: Diagnosis not present

## 2022-02-20 DIAGNOSIS — E1151 Type 2 diabetes mellitus with diabetic peripheral angiopathy without gangrene: Secondary | ICD-10-CM | POA: Diagnosis not present

## 2022-02-20 DIAGNOSIS — E1122 Type 2 diabetes mellitus with diabetic chronic kidney disease: Secondary | ICD-10-CM | POA: Diagnosis not present

## 2022-02-20 DIAGNOSIS — D631 Anemia in chronic kidney disease: Secondary | ICD-10-CM | POA: Diagnosis not present

## 2022-02-20 DIAGNOSIS — Z9981 Dependence on supplemental oxygen: Secondary | ICD-10-CM | POA: Diagnosis not present

## 2022-02-20 DIAGNOSIS — L089 Local infection of the skin and subcutaneous tissue, unspecified: Secondary | ICD-10-CM | POA: Diagnosis not present

## 2022-02-20 DIAGNOSIS — Z992 Dependence on renal dialysis: Secondary | ICD-10-CM | POA: Diagnosis not present

## 2022-02-20 DIAGNOSIS — I5032 Chronic diastolic (congestive) heart failure: Secondary | ICD-10-CM | POA: Diagnosis not present

## 2022-02-21 ENCOUNTER — Telehealth: Payer: Self-pay | Admitting: Pulmonary Disease

## 2022-02-21 DIAGNOSIS — Z992 Dependence on renal dialysis: Secondary | ICD-10-CM | POA: Diagnosis not present

## 2022-02-21 DIAGNOSIS — N186 End stage renal disease: Secondary | ICD-10-CM | POA: Diagnosis not present

## 2022-02-21 NOTE — Telephone Encounter (Signed)
I just spoke with Terri C at [facility] and she stated that on their end they showed the patient was non compliant.  I told Terri that the patient just had a Cpap Titration Study done 01/15/22. Terri stated she would pull the new sleep study and process the order

## 2022-02-23 DIAGNOSIS — N186 End stage renal disease: Secondary | ICD-10-CM | POA: Diagnosis not present

## 2022-02-23 DIAGNOSIS — Z992 Dependence on renal dialysis: Secondary | ICD-10-CM | POA: Diagnosis not present

## 2022-02-24 DIAGNOSIS — N186 End stage renal disease: Secondary | ICD-10-CM | POA: Diagnosis not present

## 2022-02-24 DIAGNOSIS — Z992 Dependence on renal dialysis: Secondary | ICD-10-CM | POA: Diagnosis not present

## 2022-02-25 DIAGNOSIS — Z794 Long term (current) use of insulin: Secondary | ICD-10-CM | POA: Diagnosis not present

## 2022-02-25 DIAGNOSIS — E11621 Type 2 diabetes mellitus with foot ulcer: Secondary | ICD-10-CM | POA: Diagnosis not present

## 2022-02-25 DIAGNOSIS — I5040 Unspecified combined systolic (congestive) and diastolic (congestive) heart failure: Secondary | ICD-10-CM | POA: Diagnosis not present

## 2022-02-25 DIAGNOSIS — E1122 Type 2 diabetes mellitus with diabetic chronic kidney disease: Secondary | ICD-10-CM | POA: Diagnosis not present

## 2022-02-25 DIAGNOSIS — L97513 Non-pressure chronic ulcer of other part of right foot with necrosis of muscle: Secondary | ICD-10-CM | POA: Diagnosis not present

## 2022-02-25 DIAGNOSIS — Z7952 Long term (current) use of systemic steroids: Secondary | ICD-10-CM | POA: Diagnosis not present

## 2022-02-25 DIAGNOSIS — Z7951 Long term (current) use of inhaled steroids: Secondary | ICD-10-CM | POA: Diagnosis not present

## 2022-02-25 DIAGNOSIS — N186 End stage renal disease: Secondary | ICD-10-CM | POA: Diagnosis not present

## 2022-02-25 DIAGNOSIS — D631 Anemia in chronic kidney disease: Secondary | ICD-10-CM | POA: Diagnosis not present

## 2022-02-25 DIAGNOSIS — Z79891 Long term (current) use of opiate analgesic: Secondary | ICD-10-CM | POA: Diagnosis not present

## 2022-02-25 DIAGNOSIS — L089 Local infection of the skin and subcutaneous tissue, unspecified: Secondary | ICD-10-CM | POA: Diagnosis not present

## 2022-02-25 DIAGNOSIS — Z992 Dependence on renal dialysis: Secondary | ICD-10-CM | POA: Diagnosis not present

## 2022-02-25 DIAGNOSIS — Z792 Long term (current) use of antibiotics: Secondary | ICD-10-CM | POA: Diagnosis not present

## 2022-02-25 DIAGNOSIS — I132 Hypertensive heart and chronic kidney disease with heart failure and with stage 5 chronic kidney disease, or end stage renal disease: Secondary | ICD-10-CM | POA: Diagnosis not present

## 2022-02-25 DIAGNOSIS — J45909 Unspecified asthma, uncomplicated: Secondary | ICD-10-CM | POA: Diagnosis not present

## 2022-02-25 DIAGNOSIS — Z9981 Dependence on supplemental oxygen: Secondary | ICD-10-CM | POA: Diagnosis not present

## 2022-02-25 DIAGNOSIS — I5032 Chronic diastolic (congestive) heart failure: Secondary | ICD-10-CM | POA: Diagnosis not present

## 2022-02-25 DIAGNOSIS — E1151 Type 2 diabetes mellitus with diabetic peripheral angiopathy without gangrene: Secondary | ICD-10-CM | POA: Diagnosis not present

## 2022-02-26 DIAGNOSIS — N186 End stage renal disease: Secondary | ICD-10-CM | POA: Diagnosis not present

## 2022-02-26 DIAGNOSIS — Z992 Dependence on renal dialysis: Secondary | ICD-10-CM | POA: Diagnosis not present

## 2022-02-27 ENCOUNTER — Ambulatory Visit: Payer: 59 | Attending: Family | Admitting: Family

## 2022-02-27 ENCOUNTER — Encounter: Payer: Self-pay | Admitting: Family

## 2022-02-27 VITALS — BP 115/74 | HR 95 | Resp 20 | Wt 273.0 lb

## 2022-02-27 DIAGNOSIS — I10 Essential (primary) hypertension: Secondary | ICD-10-CM

## 2022-02-27 DIAGNOSIS — I739 Peripheral vascular disease, unspecified: Secondary | ICD-10-CM | POA: Diagnosis not present

## 2022-02-27 DIAGNOSIS — Z9981 Dependence on supplemental oxygen: Secondary | ICD-10-CM | POA: Insufficient documentation

## 2022-02-27 DIAGNOSIS — E1151 Type 2 diabetes mellitus with diabetic peripheral angiopathy without gangrene: Secondary | ICD-10-CM | POA: Diagnosis not present

## 2022-02-27 DIAGNOSIS — Z794 Long term (current) use of insulin: Secondary | ICD-10-CM | POA: Diagnosis not present

## 2022-02-27 DIAGNOSIS — J4489 Other specified chronic obstructive pulmonary disease: Secondary | ICD-10-CM | POA: Insufficient documentation

## 2022-02-27 DIAGNOSIS — Z992 Dependence on renal dialysis: Secondary | ICD-10-CM | POA: Diagnosis not present

## 2022-02-27 DIAGNOSIS — N186 End stage renal disease: Secondary | ICD-10-CM

## 2022-02-27 DIAGNOSIS — E669 Obesity, unspecified: Secondary | ICD-10-CM | POA: Diagnosis not present

## 2022-02-27 DIAGNOSIS — G8929 Other chronic pain: Secondary | ICD-10-CM | POA: Insufficient documentation

## 2022-02-27 DIAGNOSIS — L97513 Non-pressure chronic ulcer of other part of right foot with necrosis of muscle: Secondary | ICD-10-CM | POA: Diagnosis not present

## 2022-02-27 DIAGNOSIS — I132 Hypertensive heart and chronic kidney disease with heart failure and with stage 5 chronic kidney disease, or end stage renal disease: Secondary | ICD-10-CM | POA: Diagnosis not present

## 2022-02-27 DIAGNOSIS — Z792 Long term (current) use of antibiotics: Secondary | ICD-10-CM | POA: Diagnosis not present

## 2022-02-27 DIAGNOSIS — E11621 Type 2 diabetes mellitus with foot ulcer: Secondary | ICD-10-CM | POA: Diagnosis not present

## 2022-02-27 DIAGNOSIS — Z79891 Long term (current) use of opiate analgesic: Secondary | ICD-10-CM | POA: Diagnosis not present

## 2022-02-27 DIAGNOSIS — L089 Local infection of the skin and subcutaneous tissue, unspecified: Secondary | ICD-10-CM | POA: Diagnosis not present

## 2022-02-27 DIAGNOSIS — G4733 Obstructive sleep apnea (adult) (pediatric): Secondary | ICD-10-CM | POA: Diagnosis not present

## 2022-02-27 DIAGNOSIS — E1122 Type 2 diabetes mellitus with diabetic chronic kidney disease: Secondary | ICD-10-CM | POA: Diagnosis not present

## 2022-02-27 DIAGNOSIS — I5032 Chronic diastolic (congestive) heart failure: Secondary | ICD-10-CM | POA: Diagnosis not present

## 2022-02-27 DIAGNOSIS — Z7952 Long term (current) use of systemic steroids: Secondary | ICD-10-CM | POA: Diagnosis not present

## 2022-02-27 DIAGNOSIS — D631 Anemia in chronic kidney disease: Secondary | ICD-10-CM | POA: Diagnosis not present

## 2022-02-27 DIAGNOSIS — Z7951 Long term (current) use of inhaled steroids: Secondary | ICD-10-CM | POA: Diagnosis not present

## 2022-02-27 NOTE — Patient Instructions (Signed)
Continue weighing daily and call for an overnight weight gain of 3 pounds or more or a weekly weight gain of more than 5 pounds.   If you have voicemail, please make sure your mailbox is cleaned out so that we may leave a message and please make sure to listen to any voicemails.     

## 2022-02-27 NOTE — Progress Notes (Signed)
Patient ID: [name], female    DOB: [birth year], 52 y.o.   MRN: [id number]  HPI  [name] is a 52 y/o female with a history of DM, iron deficiency anemia, HTN, COPD with chronic oxygen use, asthma, obesity and chronic heart failure.   Echo report from 09/19/21 showed an EF of 60-65% along with moderately elevated PA pressure of 54.5 mmHg, moderate LAE, moderate TR and moderate/ severe [name]. Echo report from 11/15/20 reviewed and showed an EF of 60-65% along with mild LVH, severely elevated PA pressure of 78.4 mmHg and mild MR. Echo report from 11/12/16 showed an EF of 55% along with mild MR. Reviewed echo done 06/27/16 which showed an EF of 60-65%. Previous echo was done 12/15/14 and showed an EF of 50-55% without any valvular regurgitation.   Was in the ED twice in October.  [name] presents today for a follow-up visit with a chief complaint of moderate fatigue with minimal exertion. Describes this as chronic in nature. Has associated loose cough, shortness of breath, intermittent chest pain, light-headedness, leg weakness, chronic pain and difficulty sleeping along with this. Denies any abdominal distention, palpitations, pedal edema or wheezing.   Continues on dialysis three days/ week. Has had discussion about a transplant but her weight is too high especially around her abdomen. Was on ozempic at one point and lost weight but then developed pancreatitis so [name] stopped taking it.   Has a wound on her right foot which [name] says looks great!  Past Medical History:  Diagnosis Date   (HFpEF) heart failure with preserved ejection fraction ([place])    a. 11/2014 Echo: EF 50-55%, Gr1 DD; b. 06/2016 Echo: EF 60-65%, no rwma, mod dil LA, nl RV, triv eff.   Anxiety    Asthma    Bronchitis    CHF (congestive heart failure) (HCC)    Chronic cough    Chronic respiratory failure with hypoxia and hypercapnia ([place])    a. Home O2.  (2-4L)   CKD (chronic kidney disease), stage III (HCC)    Depression     Diabetes mellitus without complication (HCC)    Diverticulosis    Dyspnea    Dysrhythmia    per pt, no arrythmias   Environmental and seasonal allergies    Extreme obesity with alveolar hypoventilation (HCC)    Hypertension    Iron deficiency anemia    a. chronic blood loss->heavy menses.   Kidney failure    dialysis tues/thur/sat   ... left arm shunt   Orthopnea    Oxygen dependent    3l/min   Pericardial effusion    a. 11/2014 CT Chest: Mod effusion; b. 11/2014 Echo: small to mod circumferential effusion. No hemodynamic compromise.   Pneumonia    history   Poorly controlled diabetes mellitus ([place])    a. 11/2014 A1c 13.2.   Swelling of both lower extremities    Past Surgical History:  Procedure Laterality Date   A/V FISTULAGRAM Left 06/09/2017   Procedure: A/V FISTULAGRAM;  Surgeon: Algernon Huxley, MD;  Location: [place] [facility];  Service: Cardiovascular;  Laterality: Left;   A/V FISTULAGRAM Left 06/16/2017   Procedure: A/V FISTULAGRAM;  Surgeon: Algernon Huxley, MD;  Location: [place] [facility];  Service: Cardiovascular;  Laterality: Left;   A/V FISTULAGRAM Left 01/28/2018   Procedure: A/V FISTULAGRAM;  Surgeon: Algernon Huxley, MD;  Location: [place] [facility];  Service: Cardiovascular;  Laterality: Left;   AMPUTATION TOE Right 10/21/2020  Procedure: AMPUTATION TOE-5th Digit Amputation Right Foot;  Surgeon: Edrick Kins, DPM;  Location: [facility];  Service: Podiatry;  Laterality: Right;   AV FISTULA PLACEMENT Left 04/25/2017   Procedure: ARTERIOVENOUS (AV) FISTULA CREATION;  Surgeon: Algernon Huxley, MD;  Location: [facility];  Service: Vascular;  Laterality: Left;   BONE BIOPSY Right 04/27/2021   Procedure: Right 4th metatarsal BONE BIOPSY;  Surgeon: [name], DPM;  Location: [facility];  Service: Podiatry;  Laterality: Right;   CATARACT EXTRACTION W/PHACO Left 01/21/2018   Procedure: CATARACT EXTRACTION PHACO AND INTRAOCULAR LENS PLACEMENT (IOC);  Surgeon: Leandrew Koyanagi,  MD;  Location: [facility];  Service: Ophthalmology;  Laterality: Left;  [place] 01:55.5 CDE 9.05 EAUP 13.9 Fluid Pack Lot # [id number]   CESAREAN SECTION     times 3   DIALYSIS/PERMA CATHETER INSERTION Right 04/04/2017   Procedure: DIALYSIS/PERMA CATHETER INSERTION;  Surgeon: Katha Cabal, MD;  Location: [place] [facility];  Service: Cardiovascular;  Laterality: Right;   DIALYSIS/PERMA CATHETER REMOVAL N/A 09/17/2017   Procedure: DIALYSIS/PERMA CATHETER REMOVAL;  Surgeon: Algernon Huxley, MD;  Location: [place] [facility];  Service: Cardiovascular;  Laterality: N/A;   INCISION AND DRAINAGE Right 10/26/2020   Procedure: INCISION AND DRAINAGE;  Surgeon: [name], DPM;  Location: [facility];  Service: Podiatry;  Laterality: Right;   INSERTION OF DIALYSIS CATHETER N/A 04/16/2017   Procedure: INSERTION OF DIALYSIS PERM CATHETER;  Surgeon: Algernon Huxley, MD;  Location: [facility];  Service: Vascular;  Laterality: N/A;   LOWER EXTREMITY ANGIOGRAPHY Right 10/18/2020   Procedure: Lower Extremity Angiography;  Surgeon: Algernon Huxley, MD;  Location: [place] [facility];  Service: Cardiovascular;  Laterality: Right;   Family History  Problem Relation Age of Onset   Diabetes Mother    Hypertension Mother    Hypertension Father    Diabetes Father    Hypertension Other    Diabetes Other    Breast cancer Maternal Aunt 20   Breast cancer Maternal Aunt 76   Social History   Tobacco Use   Smoking status: Never    Passive exposure: Past   Smokeless tobacco: Never  Substance Use Topics   Alcohol use: Not Currently    Comment: occasional   Allergies  Allergen Reactions   Dust Mite Extract Shortness Of Breath and Itching   Mold Extract [Trichophyton] Shortness Of Breath and Itching   Pollen Extract Shortness Of Breath    Itching, shortness of breath, asthma like symptoms   Sulfa Antibiotics Itching   Misc. Sulfonamide Containing Compounds    Feraheme [Ferumoxytol] Itching    Uses benadryl so [name] can  get the medicine   Prior to Admission medications   Medication Sig Start Date End Date Taking? Authorizing Provider  acetaminophen (TYLENOL) 500 MG tablet Take 1,000 mg by mouth every 6 (six) hours as needed for mild pain, fever or headache.   Yes [provider]  albuterol (PROVENTIL) (2.5 MG/3ML) 0.083% nebulizer solution Take 3 mLs (2.5 mg total) by nebulization every 6 (six) hours as needed for wheezing or shortness of breath. 02/15/22  Yes Tyler Pita, MD  albuterol (VENTOLIN HFA) 108 (90 Base) MCG/ACT inhaler Inhale 2 puffs into the lungs every 6 (six) hours as needed for wheezing or shortness of breath. 02/15/22  Yes Tyler Pita, MD  aspirin EC 81 MG tablet Take 81 mg by mouth daily.    Yes [provider]  atorvastatin (LIPITOR) 40 MG tablet Take 1  tablet (40 mg total) by mouth daily. 10/23/21  Yes Gollan, Kathlene November, MD  b complex-vitamin c-folic acid (NEPHRO-VITE) 0.8 MG TABS tablet Take 1 tablet by mouth at bedtime.   Yes [provider]  budesonide (PULMICORT) 0.25 MG/2ML nebulizer solution Take 2 mLs (0.25 mg total) by nebulization 2 (two) times daily. 01/24/21  Yes Tyler Pita, MD  budesonide (PULMICORT) 0.5 MG/2ML nebulizer solution Take by nebulization. 04/17/21  Yes [provider]  Cholecalciferol 100 MCG (4000 UT) CAPS Take 1 capsule by mouth once a week.   Yes [provider]  clindamycin (CLEOCIN T) 1 % lotion Apply 1 application topically daily. 09/19/20  Yes [provider]  ferric citrate (AURYXIA) 1 GM 210 MG(Fe) tablet Take 210 mg by mouth 3 (three) times daily with meals. Only taking once a day most days   Yes [provider]  formoterol (PERFOROMIST) 20 MCG/2ML nebulizer solution  01/19/21  Yes [provider]  insulin degludec (TRESIBA) 100 UNIT/ML FlexTouch Pen Inject 30 Units into the skin daily. 05/01/21  Yes Richarda Osmond, MD  insulin lispro (HUMALOG) 100 UNIT/ML KwikPen  Inject into the skin. 06/12/21  Yes [provider]  lactulose (CHRONULAC) 10 GM/15ML solution Take 30 mLs (20 g total) by mouth daily as needed for severe constipation. 04/26/17  Yes Epifanio Lesches, MD  lidocaine-prilocaine (EMLA) cream Apply topically as needed (For access for dialysis).   Yes [provider]  LINZESS 290 MCG CAPS capsule Take 290 mcg by mouth every morning. 10/10/20  Yes [provider]  meloxicam (MOBIC) 15 MG tablet Take 1 tablet (15 mg total) by mouth daily as needed for pain. 10/23/21  Yes Gollan, Kathlene November, MD  midodrine (PROAMATINE) 10 MG tablet Take 20 mg by mouth daily as needed. 08/23/21  Yes [provider]  montelukast (SINGULAIR) 10 MG tablet Take 1 tablet (10 mg total) by mouth at bedtime. 09/17/21  Yes Tyler Pita, MD  Omeprazole Magnesium (PRILOSEC PO)    Yes [provider]  OXYGEN Inhale 2-4 L into the lungs.   Yes [provider]  pregabalin (LYRICA) 50 MG capsule Take 100 mg by mouth every 8 (eight) hours as needed. 08/08/21  Yes [provider]  RENVELA 2.4 g PACK Take 2.4 g by mouth 3 (three) times daily. 08/07/21  Yes [provider]  revefenacin Maretta Bees) 175 MCG/3ML nebulizer solution  01/19/21  Yes [provider]  metoprolol tartrate (LOPRESSOR) 50 MG tablet Take 1 tablet (50 mg total) by mouth 2 (two) times daily. Patient not taking: Reported on 02/27/2022 06/18/21   Minna Merritts, MD   Review of Systems  Constitutional:  Positive for fatigue (easily). Negative for appetite change.  HENT:  Positive for congestion. Negative for sinus pressure.   Eyes: Negative.   Respiratory:  Positive for cough (loose cough) and shortness of breath. Negative for chest tightness and wheezing.   Cardiovascular:  Positive for chest pain (at times). Negative for palpitations and leg swelling.  Gastrointestinal:  Negative for abdominal distention and abdominal pain.  Endocrine:  Negative.   Genitourinary: Negative.   Musculoskeletal:  Positive for arthralgias (leg pain), back pain and neck pain.  Skin:        Right foot wound  Allergic/Immunologic: Negative.   Neurological:  Positive for weakness and light-headedness (at times). Negative for dizziness.  Hematological:  Negative for adenopathy. Does not bruise/bleed easily.  Psychiatric/Behavioral:  Positive for sleep disturbance (not sleeping well; wearing oxygen  at 3L). Negative for dysphoric mood. The patient is not nervous/anxious.    Vitals:   02/27/22 1318  BP: 115/74  Pulse: 95  Resp: 20  SpO2: 92%  Weight: 273 lb (123.8 kg)   Wt Readings from Last 3 Encounters:  02/27/22 273 lb (123.8 kg)  02/15/22 262 lb (118.8 kg)  12/04/21 262 lb 5.6 oz (119 kg)   Lab Results  Component Value Date   CREATININE 8.95 (H) 12/02/2021   CREATININE 10.09 (H) 05/01/2021   CREATININE 8.78 (H) 04/30/2021   Physical Exam Vitals and nursing note reviewed. Exam conducted with a chaperone present (husband).  Constitutional:      Appearance: [name] is well-developed.  HENT:     Head: Normocephalic and atraumatic.  Neck:     Vascular: No JVD.  Cardiovascular:     Rate and Rhythm: Normal rate and regular rhythm.  Pulmonary:     Effort: Pulmonary effort is normal.     Breath sounds: No wheezing, rhonchi or rales.  Abdominal:     General: There is no distension.     Tenderness: There is no abdominal tenderness.  Musculoskeletal:        General: No tenderness.     Cervical back: Normal range of motion and neck supple.  Skin:    General: Skin is warm and dry.  Neurological:     Mental Status: [name] is alert and oriented to person, place, and time.  Psychiatric:        Behavior: Behavior normal.        Thought Content: Thought content normal.   Assessment & Plan:  1: Chronic heart failure with preserved ejection fraction with structural changes (LAE)- - NYHA class III - euvolemic - patient is weighing daily.  Reminded to call for 2 lb weight gain overnight or 5 lb weight gain in a week.  - weight up 4 pounds from last visit here 6 months ago; will have dialysis tomorrow - very little walking as [name]'s trying to get her foot wound healed - continues to try eat low sodium diet but admits that it hasn't been easy lately - last saw cardiology Rockey Situ) 10/23/21 - BNP 03/27/17 was 577  2. HTN-  - BP 115/74 - BMP 12/02/21 shows sodium 133, potassium 4.1, SCr 8.95 & GFR 5 - dialysis Tuesday, Thursday, Saturday and has been for the last 4 years - talk of kidney transplant but [name] says [name] has to get her weight down especially around her abdomen; [name] has declined gastric bypass surgery  3: Diabetes-  - nonfasting glucose at home this morning was 212 - A1c 04/22/21 was 6.2%  4: PVD- - went to wound clinic 02/15/22 - has home health nursing  - saw ID Delaine Lame) 06/12/21  5 Obstructive sleep apnea- - saw pulmonology Patsey Berthold) 02/15/22 - CPAP titration study done 01/15/22 - wearing oxygen at 3L around the clock   Medication list reviewed.   Offered to make a return PRN but [name]'d like to schedule another for 1 year. Advised her that [name] could call back at anytime prior to that for any issues/ concerns and [name] was comfortable with this.

## 2022-02-28 DIAGNOSIS — N186 End stage renal disease: Secondary | ICD-10-CM | POA: Diagnosis not present

## 2022-02-28 DIAGNOSIS — L97513 Non-pressure chronic ulcer of other part of right foot with necrosis of muscle: Secondary | ICD-10-CM | POA: Diagnosis not present

## 2022-02-28 DIAGNOSIS — Z992 Dependence on renal dialysis: Secondary | ICD-10-CM | POA: Diagnosis not present

## 2022-03-01 ENCOUNTER — Encounter: Payer: 59 | Attending: Physician Assistant | Admitting: Physician Assistant

## 2022-03-01 DIAGNOSIS — M86371 Chronic multifocal osteomyelitis, right ankle and foot: Secondary | ICD-10-CM | POA: Insufficient documentation

## 2022-03-01 DIAGNOSIS — I132 Hypertensive heart and chronic kidney disease with heart failure and with stage 5 chronic kidney disease, or end stage renal disease: Secondary | ICD-10-CM | POA: Insufficient documentation

## 2022-03-01 DIAGNOSIS — E1122 Type 2 diabetes mellitus with diabetic chronic kidney disease: Secondary | ICD-10-CM | POA: Diagnosis not present

## 2022-03-01 DIAGNOSIS — I5042 Chronic combined systolic (congestive) and diastolic (congestive) heart failure: Secondary | ICD-10-CM | POA: Diagnosis not present

## 2022-03-01 DIAGNOSIS — E1169 Type 2 diabetes mellitus with other specified complication: Secondary | ICD-10-CM | POA: Diagnosis not present

## 2022-03-01 DIAGNOSIS — Z992 Dependence on renal dialysis: Secondary | ICD-10-CM | POA: Diagnosis not present

## 2022-03-01 DIAGNOSIS — L97514 Non-pressure chronic ulcer of other part of right foot with necrosis of bone: Secondary | ICD-10-CM | POA: Insufficient documentation

## 2022-03-01 DIAGNOSIS — E11621 Type 2 diabetes mellitus with foot ulcer: Secondary | ICD-10-CM | POA: Insufficient documentation

## 2022-03-01 DIAGNOSIS — N186 End stage renal disease: Secondary | ICD-10-CM | POA: Diagnosis not present

## 2022-03-01 DIAGNOSIS — Z9981 Dependence on supplemental oxygen: Secondary | ICD-10-CM | POA: Insufficient documentation

## 2022-03-01 DIAGNOSIS — L97512 Non-pressure chronic ulcer of other part of right foot with fat layer exposed: Secondary | ICD-10-CM | POA: Diagnosis not present

## 2022-03-01 NOTE — Progress Notes (Signed)
[name] ([id number]) [id number] Page 1 of 10 Visit Report for 03/01/2022 Arrival Information Details Patient Name: Date of Service: [name] RLA M. 03/01/2022 9:30 A M Medical Record Number: [id number] Patient Account Number: [id number] Date of Birth/Sex: Treating RN: [birth year] (52 y.o. Orvan Falconer Primary Care Sylis Ketchum: Evern Bio Other Clinician: Massie Kluver Referring Fidencia Mccloud: Treating Matisyn Cabeza/Extender: Darien Ramus Weeks in Treatment: 35 Visit Information History Since Last Visit All ordered tests and consults were completed: No Patient Arrived: Wheel Chair Added or deleted any medications: No Arrival Time: 09:23 Any new allergies or adverse reactions: No Transfer Assistance: EasyPivot Patient Lift Had a fall or experienced change in No Patient Requires Transmission-Based Precautions: No activities of daily living that may affect Patient Has Alerts: No risk of falls: Signs or symptoms of abuse/neglect since No last visito Hospitalized since last visit: No Implantable device outside of the clinic No excluding cellular tissue based products placed in the center since last visit: Has Dressing in Place as Prescribed: Yes Has Footwear/Offloading in Place as Yes Prescribed: Left: Surgical Shoe with Pressure Relief Insole Pain Present Now: No Electronic Signature(s) Signed: 03/01/2022 1:15:46 PM By: Massie Kluver Entered By: Massie Kluver on 03/01/2022 09:24:14 -------------------------------------------------------------------------------- Clinic Level of Care Assessment Details Patient Name: Date of Service: [name], [name] RLA M. 03/01/2022 9:30 A M Medical Record Number: [id number] Patient Account Number: [id number] Date of Birth/Sex: Treating RN: August 18, 1970 (52 y.o. Orvan Falconer Primary Care Kennady Zimmerle: Evern Bio Other Clinician: Massie Kluver Referring Mica Releford: Treating Fynlee Rowlands/Extender: Darien Ramus Weeks in Treatment: [facility] Level of Care Assessment Items TOOL 1 Quantity Score Rosasco, Richmond [name] ([id number]) [id number] Page 2 of 10 '[]'$  - 0 Use when EandM and Procedure is performed on INITIAL visit ASSESSMENTS - Nursing Assessment / Reassessment '[]'$  - 0 General Physical Exam (combine w/ comprehensive assessment (listed just below) when performed on new pt. evals) '[]'$  - 0 Comprehensive Assessment (HX, ROS, Risk Assessments, Wounds Hx, etc.) ASSESSMENTS - Wound and Skin Assessment / Reassessment '[]'$  - 0 Dermatologic / Skin Assessment (not related to wound area) ASSESSMENTS - Ostomy and/or Continence Assessment and Care '[]'$  - 0 Incontinence Assessment and Management '[]'$  - 0 Ostomy Care Assessment and Management (repouching, etc.) PROCESS - Coordination of Care '[]'$  - 0 Simple Patient / Family Education for ongoing care '[]'$  - 0 Complex (extensive) Patient / Family Education for ongoing care '[]'$  - 0 Staff obtains Programmer, systems, Records, T Results / Process Orders est '[]'$  - 0 Staff telephones HHA, Nursing Homes / Clarify orders / etc '[]'$  - 0 Routine Transfer to another Facility (non-emergent condition) '[]'$  - 0 Routine Hospital Admission (non-emergent condition) '[]'$  - 0 New Admissions / Biomedical engineer / Ordering NPWT Apligraf, etc. , '[]'$  - 0 Emergency Hospital Admission (emergent condition) PROCESS - Special Needs '[]'$  - 0 Pediatric / Minor Patient Management '[]'$  - 0 Isolation Patient Management '[]'$  - 0 Hearing / Language / Visual special needs '[]'$  - 0 Assessment of Community assistance (transportation, D/C planning, etc.) '[]'$  - 0 Additional assistance / Altered mentation '[]'$  - 0 Support Surface(s) Assessment (bed, cushion, seat, etc.) INTERVENTIONS - Miscellaneous '[]'$  - 0 External ear exam '[]'$  - 0 Patient Transfer (multiple staff / Civil Service fast streamer / Similar devices) '[]'$  - 0 Simple Staple / Suture removal (25 or less) '[]'$  - 0 Complex  Staple / Suture removal (26 or more) '[]'$  - 0 Hypo/Hyperglycemic Management (do not check if billed separately) '[]'$  - 0 Ankle / Brachial Index (ABI) -  do not check if billed separately Has the patient been seen at the hospital within the last three years: Yes Total Score: 0 Level Of Care: ____ Electronic Signature(s) Signed: 03/01/2022 1:15:46 PM By: Massie Kluver Entered By: Massie Kluver on 03/01/2022 09:54:51 Encounter Discharge Information Details -------------------------------------------------------------------------------- Pesantez, Richmond [name] ([id number]) [id number] Page 3 of 10 Patient Name: Date of Service: [name] RLA M. 03/01/2022 9:30 A M Medical Record Number: [id number] Patient Account Number: [id number] Date of Birth/Sex: Treating RN: [birth year] (52 y.o. Orvan Falconer Primary Care Janay Canan: Evern Bio Other Clinician: Massie Kluver Referring Kasia Trego: Treating Dawt Reeb/Extender: Darien Ramus Weeks in Treatment: 46 Encounter Discharge Information Items Post Procedure Vitals Discharge Condition: Stable Temperature (F): 98.4 Ambulatory Status: Wheelchair Pulse (bpm): 96 Discharge Destination: Home Respiratory Rate (breaths/min): 18 Transportation: Private Auto Blood Pressure (mmHg): 113/66 Accompanied By: husband Schedule Follow-up Appointment: Yes Clinical Summary of Care: Electronic Signature(s) Signed: 03/01/2022 1:15:46 PM By: Massie Kluver Entered By: Massie Kluver on 03/01/2022 10:20:22 -------------------------------------------------------------------------------- Lower Extremity Assessment Details Patient Name: Date of Service: [name], [name] RLA M. 03/01/2022 9:30 A M Medical Record Number: [id number] Patient Account Number: [id number] Date of Birth/Sex: Treating RN: 1970/12/19 (52 y.o. Orvan Falconer Primary Care Anie Juniel: Evern Bio Other Clinician: Massie Kluver Referring Derius Ghosh: Treating  Issacc Merlo/Extender: Darien Ramus Weeks in Treatment: 67 Electronic Signature(s) Signed: 03/01/2022 12:23:06 PM By: Carlene Coria RN Signed: 03/01/2022 1:15:46 PM By: Massie Kluver Entered By: Massie Kluver on 03/01/2022 09:35:27 -------------------------------------------------------------------------------- Multi Wound Chart Details Patient Name: Date of Service: [name], [name] RLA M. 03/01/2022 9:30 A M Medical Record Number: [id number] Patient Account Number: [id number] Date of Birth/Sex: Treating RN: 12/22/1970 (52 y.o. Orvan Falconer Primary Care Alijah Hyde: Evern Bio Other Clinician: Massie Kluver Referring Lorraine Terriquez: Treating Bettie Swavely/Extender: Darien Ramus Weeks in Treatment: 34 Vital Signs Height(in): Pulse(bpm): 96 Weight(lbs): Blood Pressure(mmHg): 113/66 Shirah, Richmond [name] ([id number]) [id number] Page 4 of 10 Body Mass Index(BMI): Temperature(F): 98.4 Respiratory Rate(breaths/min): 18 [2:Photos:] [N/A:N/A] Right, Circumferential Foot N/A N/A Wound Location: Gradually Appeared N/A N/A Wounding Event: Diabetic Wound/Ulcer of the Lower N/A N/A Primary Etiology: Extremity Cataracts, Anemia, Asthma, Chronic N/A N/A Comorbid History: Obstructive Pulmonary Disease (COPD), Congestive Heart Failure, Hypertension, Type II Diabetes, End Stage Renal Disease, Osteoarthritis, Neuropathy 10/15/2020 N/A N/A Date Acquired: 1 N/A N/A Weeks of Treatment: Open N/A N/A Wound Status: No N/A N/A Wound Recurrence: Yes N/A N/A Pending A mputation on Presentation: 8x8.5x0.2 N/A N/A Measurements L x W x D (cm) 53.407 N/A N/A A (cm) : rea 10.681 N/A N/A Volume (cm) : 49.60% N/A N/A % Reduction in A rea: 95.00% N/A N/A % Reduction in Volume: Grade 4 N/A N/A Classification: Medium N/A N/A Exudate A mount: Serosanguineous N/A N/A Exudate Type: red, brown N/A N/A Exudate Color: Medium (34-66%) N/A  N/A Granulation A mount: Medium (34-66%) N/A N/A Necrotic A mount: Eschar, Necrosis of Bone N/A N/A Necrotic Tissue: Fat Layer (Subcutaneous Tissue): Yes N/A N/A Exposed Structures: Fascia: No Tendon: No Muscle: No Joint: No Bone: No Treatment Notes Electronic Signature(s) Signed: 03/01/2022 1:15:46 PM By: Massie Kluver Entered By: Massie Kluver on 03/01/2022 09:35:31 [name], Richmond [name] ([id number]) [id number] Page 5 of 10 -------------------------------------------------------------------------------- Multi-Disciplinary Care Plan Details Patient Name: Date of Service: [name], [name] RLA M. 03/01/2022 9:30 A M Medical Record Number: [id number] Patient Account Number: [id number] Date of Birth/Sex: Treating RN: April 16, 1970 (52 y.o. Orvan Falconer Primary Care Yukari Flax: Evern Bio Other Clinician: Massie Kluver Referring Loyal Rudy: Treating Shaye Lagace/Extender: Luane School, Chelsa Suella [place]  in Treatment: 41 Active Inactive HBO Nursing Diagnoses: Anxiety related to feelings of confinement associated with the hyperbaric oxygen chamber Anxiety related to knowledge deficit of hyperbaric oxygen therapy and treatment procedures Discomfort related to temperature and humidity changes inside hyperbaric chamber Potential for barotraumas to ears, sinuses, teeth, and lungs or cerebral gas embolism related to changes in atmospheric pressure inside hyperbaric oxygen chamber Potential for oxygen toxicity seizures related to delivery of 100% oxygen at an increased atmospheric pressure Potential for pulmonary oxygen toxicity related to delivery of 100% oxygen at an increased atmospheric pressure Goals: Barotrauma will be prevented during HBO2 Date Initiated: 06/05/2021 T arget Resolution Date: 06/05/2021 Goal Status: Active Patient and/or family will be able to state/discuss factors appropriate to the management of their disease process during treatment Date Initiated:  06/05/2021 T arget Resolution Date: 06/05/2021 Goal Status: Active Patient will tolerate the hyperbaric oxygen therapy treatment Date Initiated: 06/05/2021 T arget Resolution Date: 06/05/2021 Goal Status: Active Patient will tolerate the internal climate of the chamber Date Initiated: 06/05/2021 T arget Resolution Date: 06/05/2021 Goal Status: Active Patient/caregiver will verbalize understanding of HBO goals, rationale, procedures and potential hazards Date Initiated: 06/05/2021 T arget Resolution Date: 06/05/2021 Goal Status: Active Signs and symptoms of pulmonary oxygen toxicity will be recognized and promptly addressed Date Initiated: 06/05/2021 T arget Resolution Date: 06/05/2021 Goal Status: Active Signs and symptoms of seizure will be recognized and promptly addressed ; seizing patients will suffer no harm Date Initiated: 06/05/2021 T arget Resolution Date: 06/05/2021 Goal Status: Active Interventions: Administer a five (5) minute air break for patient if signs and symptoms of seizure appear and notify the hyperbaric physician Administer decongestants, per physician orders, prior to HBO2 Administer the correct therapeutic gas delivery based on the patients needs and limitations, per physician order Assess and provide for patients comfort related to the hyperbaric environment and equalization of middle ear Assess for signs and symptoms related to adverse events, including but not limited to confinement anxiety, pneumothorax, oxygen toxicity and baurotrauma Assess patient for any history of confinement anxiety Assess patient's knowledge and expectations regarding hyperbaric medicine and provide education related to the hyperbaric environment, goals of treatment and prevention of adverse events Implement protocols to decrease risk of pneumothorax in high risk patients Notes: Osteomyelitis Nursing Diagnoses: [name], Richmond [name] ([id number]) [id number] Page 6 of  10 Infection: osteomyelitis Knowledge deficit related to disease process and management Potential for infection: osteomyelitis Goals: Diagnostic evaluation for osteomyelitis completed as ordered Date Initiated: 06/05/2021 Target Resolution Date: 06/05/2021 Goal Status: Active Patient/caregiver will verbalize understanding of disease process and disease management Date Initiated: 06/05/2021 Target Resolution Date: 06/05/2021 Goal Status: Active [name] osteomyelitis will resolve Date Initiated: 06/05/2021 Target Resolution Date: 06/05/2021 Goal Status: Active Signs and symptoms for osteomyelitis will be recognized and promptly addressed Date Initiated: 06/05/2021 Target Resolution Date: 06/05/2021 Goal Status: Active Interventions: Assess for signs and symptoms of osteomyelitis resolution every visit Provide education on osteomyelitis Notes: Wound/Skin Impairment Nursing Diagnoses: Impaired tissue integrity Knowledge deficit related to ulceration/compromised skin integrity Goals: Ulcer/skin breakdown will have a volume reduction of 30% by week 4 Date Initiated: 05/18/2021 Target Resolution Date: 06/15/2021 Goal Status: Active Ulcer/skin breakdown will have a volume reduction of 50% by week 8 Date Initiated: 05/18/2021 Target Resolution Date: 07/13/2021 Goal Status: Active Ulcer/skin breakdown will have a volume reduction of 80% by week 12 Date Initiated: 05/18/2021 Target Resolution Date: 08/10/2021 Goal Status: Active Ulcer/skin breakdown will heal within 14 weeks Date Initiated: 05/18/2021 Target Resolution Date: 08/24/2021 Goal  Status: Active Interventions: Assess patient/caregiver ability to obtain necessary supplies Assess patient/caregiver ability to perform ulcer/skin care regimen upon admission and as needed Assess ulceration(s) every visit Provide education on ulcer and skin care Notes: Electronic Signature(s) Signed: 03/01/2022 12:23:06 PM By: Carlene Coria RN Signed:  03/01/2022 1:15:46 PM By: Massie Kluver Entered By: Massie Kluver on 03/01/2022 10:19:26 -------------------------------------------------------------------------------- Pain Assessment Details Patient Name: Date of Service: Demasi, [name] RLA M. 03/01/2022 9:30 A M Medical Record Number: [id number] Patient Account Number: [id number] Date of Birth/Sex: Treating RN: December 01, 1970 (52 y.o. [address] [place], [place] ([id number]) [id number] Page 7 of 10 Primary Care Niylah Hassan: Evern Bio Other Clinician: Massie Kluver Referring Matt Delpizzo: Treating Anasofia Micallef/Extender: Darien Ramus Weeks in Treatment: 56 Active Problems Location of Pain Severity and Description of Pain Patient Has Paino No Site Locations Pain Management and Medication Current Pain Management: Electronic Signature(s) Signed: 03/01/2022 12:23:06 PM By: Carlene Coria RN Signed: 03/01/2022 1:15:46 PM By: Massie Kluver Entered By: Massie Kluver on 03/01/2022 09:33:48 -------------------------------------------------------------------------------- Patient/Caregiver Education Details Patient Name: Date of Service: Magnone, [name] RLA M. 1/5/2024andnbsp9:30 A M Medical Record Number: [id number] Patient Account Number: [id number] Date of Birth/Gender: Treating RN: [birth year] (52 y.o. Orvan Falconer Primary Care Physician: Evern Bio Other Clinician: Massie Kluver Referring Physician: Treating Physician/Extender: Darien Ramus Weeks in Treatment: 7 Education Assessment Education Provided To: Patient Education Topics Provided Wound/Skin Impairment: Handouts: Other: continue wound care as directed Methods: Demonstration Responses: State content correctly Electronic Signature(s) Signed: 03/01/2022 1:15:46 PM By: Massie Kluver Smail, Signed: 03/01/2022 1:15:46 PM By: Edman Circle ([id number]) [id number] Page 8 of 10 Entered By:  Massie Kluver on 03/01/2022 10:19:20 -------------------------------------------------------------------------------- Wound Assessment Details Patient Name: Date of Service: [name], [name] RLA M. 03/01/2022 9:30 A M Medical Record Number: [id number] Patient Account Number: [id number] Date of Birth/Sex: Treating RN: 06-Feb-1971 (52 y.o. Orvan Falconer Primary Care Treshun Wold: Evern Bio Other Clinician: Massie Kluver Referring Arabell Neria: Treating [place] Giammona/Extender: Darien Ramus Weeks in Treatment: 21 Wound Status Wound Number: 2 Primary Diabetic Wound/Ulcer of the Lower Extremity Etiology: Wound Location: Right, Circumferential Foot Wound Open Wounding Event: Gradually Appeared Status: Date Acquired: 10/15/2020 Notes: open areas to midline right foot, lateral right foot, and top of right Weeks Of Treatment: 41 foot Clustered Wound: No Comorbid Cataracts, Anemia, Asthma, Chronic Obstructive Pulmonary Pending Amputation On Presentation History: Disease (COPD), Congestive Heart Failure, Hypertension, Type II Diabetes, End Stage Renal Disease, Osteoarthritis, Neuropathy Photos Wound Measurements Length: (cm) 8 Width: (cm) 8.5 Depth: (cm) 0.2 Area: (cm) 53.407 Volume: (cm) 10.681 % Reduction in Area: 49.6% % Reduction in Volume: 95% Wound Description Classification: Grade 4 Exudate Amount: Medium Exudate Type: Serosanguineous Exudate Color: red, brown Foul Odor After Cleansing: No Slough/Fibrino Yes Wound Bed Granulation Amount: Medium (34-66%) Exposed Structure Necrotic Amount: Medium (34-66%) Fascia Exposed: No Necrotic Quality: Eschar, Bone Fat Layer (Subcutaneous Tissue) Exposed: Yes Tendon Exposed: No Muscle Exposed: No Joint Exposed: No Bone Exposed: No Treatment Notes Badie, Richmond [name] ([id number]) [id number] Page 9 of 10 Wound #2 (Foot) Wound Laterality: Right, Circumferential Cleanser Normal Saline Discharge  Instruction: Wash your hands with soap and water. Remove old dressing, discard into plastic bag and place into trash. Cleanse the wound with Normal Saline prior to applying a clean dressing using gauze sponges, not tissues or cotton balls. Do not scrub or use excessive force. Pat dry using gauze sponges, not tissue or cotton balls. Soap and Water Discharge Instruction: Gently cleanse wound with antibacterial soap, rinse and pat  dry prior to dressing wounds Peri-Wound Care Topical Betadine Discharge Instruction: apply gauze soaked dressing to top of foot and toes Primary Dressing Gauze Discharge Instruction: As directed: betadine applied Prisma 4.34 (in) Discharge Instruction: Moisten w/normal saline or sterile water; Cover open areas of wound as directed Do not remove from wound bed. Secondary Dressing ABD Pad 5x9 (in/in) Discharge Instruction: Cover with ABD pad Secured With Medipore T - 2M Medipore H Soft Cloth Surgical T ape ape, 2x2 (in/yd) Kerlix Roll Sterile or Non-Sterile 6-ply 4.5x4 (yd/yd) Discharge Instruction: Apply Kerlix as directed Stretch Net Dressing, Latex-free, Size 5, Small-Head / Shoulder / Thigh Compression Wrap Compression Stockings Add-Ons Electronic Signature(s) Signed: 03/01/2022 12:23:06 PM By: Carlene Coria RN Signed: 03/01/2022 1:15:46 PM By: Massie Kluver Entered By: Massie Kluver on 03/01/2022 09:34:36 -------------------------------------------------------------------------------- Vitals Details Patient Name: Date of Service: [name], [name] RLA M. 03/01/2022 9:30 A M Medical Record Number: [id number] Patient Account Number: [id number] Date of Birth/Sex: Treating RN: 08-03-1970 (52 y.o. Orvan Falconer Primary Care Napolean Sia: Evern Bio Other Clinician: Massie Kluver Referring Sally Menard: Treating Jone Panebianco/Extender: Darien Ramus Weeks in Treatment: 72 Vital Signs Time Taken: 09:25 Temperature (F): 98.4 Pulse (bpm): 96 Respiratory  Rate (breaths/min): 18 Blood Pressure (mmHg): 113/66 Reference Range: 80 - 120 mg / dl Electronic Signature(s) [name], Richmond [name] ([id number]) [id number] Page 10 of 10 Signed: 03/01/2022 1:15:46 PM By: Massie Kluver Entered By: Massie Kluver on 03/01/2022 09:27:49

## 2022-03-01 NOTE — Progress Notes (Addendum)
Blower, [name] ([id number]) [id number] Page 1 of 10 Visit Report for 03/01/2022 Chief Complaint Document Details Patient Name: Date of Service: [name], [name] RLA M. 03/01/2022 9:30 A M Medical Record Number: [id number] Patient Account Number: [id number] Date of Birth/Sex: Treating RN: 1970/08/20 (52 y.o. Orvan Falconer Primary Care Provider: Evern Bio Other Clinician: Massie Kluver Referring Provider: Treating Provider/Extender: Darien Ramus Weeks in Treatment: 9 Information Obtained from: Patient Chief Complaint Right foot gangrene and osteomyelitis Electronic Signature(s) Signed: 03/01/2022 9:41:03 AM By: Worthy Keeler PA-C Entered By: Worthy Keeler on 03/01/2022 09:41:03 -------------------------------------------------------------------------------- Debridement Details Patient Name: Date of Service: [name] RLA M. 03/01/2022 9:30 A M Medical Record Number: [id number] Patient Account Number: [id number] Date of Birth/Sex: Treating RN: [birth year] (52 y.o. Orvan Falconer Primary Care Provider: Evern Bio Other Clinician: Massie Kluver Referring Provider: Treating Provider/Extender: Darien Ramus Weeks in Treatment: 65 Debridement Performed for Assessment: Wound #2 Right,Circumferential Foot Performed By: Physician Tommie Sams., PA-C Debridement Type: Debridement Severity of Tissue Pre Debridement: Fat layer exposed Level of Consciousness (Pre-procedure): Awake and Alert Pre-procedure Verification/Time Out Yes - 09:49 Taken: Start Time: 09:49 T Area Debrided (L x W): otal 8 (cm) x 8.5 (cm) = 68 (cm) Tissue and other material debrided: Viable, Non-Viable, Eschar, Slough, Subcutaneous, Slough Level: Skin/Subcutaneous Tissue Debridement Description: Excisional Instrument: Curette Bleeding: Minimum Hemostasis Achieved: Pressure Response to Treatment: Procedure was tolerated well Level of Consciousness  (Post- Awake and Alert procedure): Pittmon, [name] ([id number]) [id number] Page 2 of 10 Post Debridement Measurements of Total Wound Length: (cm) 8.5 Width: (cm) 8.5 Depth: (cm) 0.2 Volume: (cm) 11.349 Character of Wound/Ulcer Post Debridement: Stable Severity of Tissue Post Debridement: Fat layer exposed Post Procedure Diagnosis Same as Pre-procedure Electronic Signature(s) Signed: 03/01/2022 12:23:06 PM By: Carlene Coria RN Signed: 03/01/2022 1:15:46 PM By: Massie Kluver Signed: 03/01/2022 1:38:11 PM By: Worthy Keeler PA-C Entered By: Massie Kluver on 03/01/2022 09:54:05 -------------------------------------------------------------------------------- HPI Details Patient Name: Date of Service: [name] RLA M. 03/01/2022 9:30 A M Medical Record Number: [id number] Patient Account Number: [id number] Date of Birth/Sex: Treating RN: Jan 15, 1971 (52 y.o. Orvan Falconer Primary Care Provider: Evern Bio Other Clinician: Massie Kluver Referring Provider: Treating Provider/Extender: Darien Ramus Weeks in Treatment: 64 History of Present Illness HPI Description: 10/04/2020 upon evaluation today patient presents for initial inspection here in the clinic concerning a fairly concerning wound over her right fifth toe towards the medial aspect which actually probes all the way down to bone. There is evidence of necrotic muscle as well and I do see tendon exposed as well. Subsequently this does have me very concerned about where things stand here. Again this is the first time that I am seeing her and on top of everything else her ABIs are noncompressible. I think that arterial flow is the primary concern here. Of note the patient states this was first noted July 1. She had a x-ray on July 4 which was negative for bone infection. She has been on 2 antibiotics she is unsure of the name. With that being said her most recent hemoglobin A1c was 8.1 that  was December 2022. She otherwise does have a history obviously of diabetes mellitus type 2, hypertension, end-stage renal disease with dependence on renal dialysis, and she is also dependent on supplemental oxygen. Readmission: 05/14/2021 this is a patient whom I previously seen October 04, 2020. With that being said she is subsequently on 21 October 2020 ended up having an  amputation of her fifth digit of her foot unfortunately. This had progressed fairly quickly into what sounds to been gangrene at that time. Subsequently right now based on what I am seeing the patient actually has a necrotic area on the end of her foot and has been recommended that the only option really here is going to be a amputation which would be a below-knee amputation. She is really not interested in that and wants to try to give this every chance it can to heal. For that reason I discussed with her that definitely we can see what we can do to try to improve this situation and try to help get this doing better as best we can. With that being said there is definitely a portion of her wound that is just not good to be able to heal no matter what it is already starting to auto amputate. This includes potentially digits of her foot as well based on what I am seeing. She does have confirmed osteomyelitis noted by MRI. She is also currently on IV vancomycin. She does undergo dialysis on Tuesday, Thursday, and Saturday. The patient does have a history of diabetes mellitus type 2, congestive heart failure, hypertension, end-stage renal disease, dependence on renal dialysis, and dependence on supplemental oxygen. With that being said I am going to need to look into her heart failure and the significance of this as far as hyperbarics is concerned before we initiate treatment. She is probably also going to need an updated EKG and chest x-ray unless its already been done recently. 05/28/2021 upon evaluation today patient appears to be doing  well with regard to her wound all things considered. I am going to try to see what we can do about trimmed away some of the necrotic tissue that is really just hanging on here and there and not really helpful. Feel like if we can slowly start doing this maybe we will be able to get to the point where this is better and better as far as cleaning up the surface of the wound is concerned. Still as I explained I do believe some of her toes are not salvageable but again she is looking more towards autoamputation versus want to do anything definitive here from a surgical standpoint. I have been researching whether she would be an okay candidate for hyperbarics or not and I found that her x-ray but she had a chest x-ray recently was negative for any acute disease and appear to be stable. EKG was also stable. She did also have a cardiac echo which showed that her ejection fraction was 60 to 65% and therefore I think she is a candidate for hyperbarics. Again I do believe that this can help with preventing amputation which right now she has been told that her only option would be a below-knee amputation. Nonetheless I think that if we can get the heel leading to spread and the majority of this cleared away that it is possible we might even be able to get a surgeon just to surgically remove the nonviable toes and not have to go the full way of complete amputation. Nonetheless we do have a ways to go before we will get to that point. Either way I think the hyperbarics could be an excellent support as far as that is concerned. 06-05-2021 upon evaluation today patient's wound is showing some signs of improvement. This does seem to be slow and of course it is understandable considering what we are seeing. She has  discussed and looked up information about hyperbarics and in the end comes back today and states that she does [place]e, [name] ([id number]) [id number] Page 3 of 10 want to  proceed. I honestly do feel like that is her best chance at saving the foot. Even if we are able to get her into a surgeon after we obtain some good healing approximately so that there is not as much necrotic tissue in general she may even be able to have a transmetatarsal amputation and get this to heal. Again I am not saying this is can be an easy road but it may be her best road as far as thinking about how to achieve some type of complete healing as I do not believe the toes are viable at all to be honest. 07-06-2021 upon evaluation today patient appears to be doing well currently in regard to her foot all things considered. Again she unfortunately was unable to do the hyperbaric oxygen therapy this was due to her claustrophobia. Nonetheless she tells me that she is pleased with how the foot appears and she does feel like there is good improvement going on here. In general I think that we are on the right track. If she is going to take a very long time and to be honest the route of autoamputation which is pretty much the way this is going is not a quick or easy road but nonetheless she seems to be taking care of her foot quite well. She is in good spirits. 07-27-2021 upon evaluation today patient appears to be doing well currently in regard to her foot all things considered. A lot of the eschar is starting to lift up around the edges which is good there is still significant necrotic tissue on the distal portion of the foot. This is slowly going to work its way off. Based on what I am seeing currently I do not believe that there is any evidence of infection which is good news. 08-31-2021 upon evaluation today patient's wound actually appears to be doing decently well. I do not see anything in particular to try to trim away today. With that being said she continues to slowly allow this to do what is going to do as far as autoamputation is concerned. I did have another conversation with her today about the  possibility of seeing a surgeon for a transmetatarsal amputation which again would help to get this to close faster and obviously I think would be a much speedy year and safer way to proceed with amputation. With that being said she tells me that she would really prefer to just let this go at "God pace". 09-28-2021 upon evaluation today patient's foot actually appears to be doing much better. Fortunately I do not see any signs of infection which is great news and overall I am extremely pleased with where we stand today. There does not appear to be any signs of active infection locally or systemically which is great news. No fevers, chills, nausea, vomiting, or diarrhea. 10-26-2021 upon evaluation today patient appears to be doing well currently in regard to her foot all things considered. Fortunately I do not see any evidence of active infection locally or systemically at this time which is great news. No fevers, chills, nausea, vomiting, or diarrhea. She does have some of the eschar that is lifting up even a little bit more compared to last time I saw her. 11-30-2021 upon evaluation today patient's wound is still continuing to have some areas that  lift up although there is nothing really that I feel like I could trim away too much today to be honest. Fortunately I do not see any evidence of systemic infection though locally she has been concerned about some drainage I do not see anything that seems to be actively red or erythematous or warm to touch which is good news. No fevers, chills, nausea, vomiting, or diarrhea. 12-21-2021 upon evaluation patient's foot ulcer actually is showing signs of some improvement with healthier tissue noted at areas with eschar that keeps clearing off. There is a bit of the eschar that is loosening up today as well and I think we will get a be able to get some of this cleared away that we have been working on for a while. Fortunately I do not see any evidence of infection  locally or systemically at this time which is great news. No fevers, chills, nausea, vomiting, or diarrhea. 01-11-2022 upon evaluation today patient appears to be doing decently well in regard to her foot she still has a necrotic area on the tip of her foot but she is tolerating the dressing changes without complication. Fortunately there does not appear to be any signs of infection locally or systemically at this time which is great news. No fevers, chills, nausea, vomiting, or diarrhea. 02-01-2022 upon evaluation today patient continues to show signs of improvement in regard to the area where we have been able to remove the eschar. Unfortunately the tip of the foot I think is the part that is completely dead and not going to be coming back as far as any healing is concerned. I have discussed this with the patient multiple times however she does not want to have any type of surgical intervention. Obviously in that regard we will try to do what we can as best we can. 02-15-2022 upon evaluation today patient appears to be doing well with regard to her foot. She is actually showing some signs of improvement she has had some drainage but nothing appears to be doing too poorly overall. I do not see any signs of infection and I am pleased in that regard. Fortunately I do not see any evidence of infection locally nor systemically which is great news. 03-01-2022 upon evaluation today patient appears to be doing well currently in regard to her wounds. She has been tolerating the dressing changes without complication. Fortunately I do not see any signs of active infection locally nor systemically which is great news. No fevers, chills, nausea, vomiting, or diarrhea. Electronic Signature(s) Signed: 03/01/2022 10:01:44 AM By: Worthy Keeler PA-C Entered By: Worthy Keeler on 03/01/2022 10:01:44 -------------------------------------------------------------------------------- Physical Exam Details Patient Name:  Date of Service: [name], [name] RLA M. 03/01/2022 9:30 A M Medical Record Number: [id number] Patient Account Number: [id number] Date of Birth/Sex: Treating RN: 09-May-1970 (52 y.o. Orvan Falconer Primary Care Provider: Evern Bio Other Clinician: Massie Kluver Referring Provider: Treating Provider/Extender: Darien Ramus Weeks in Treatment: 30 Constitutional Well-nourished and well-hydrated in no acute distress. Mcpherson, [name] ([id number]) [id number] Page 4 of 10 Respiratory normal breathing without difficulty. Psychiatric this patient is able to make decisions and demonstrates good insight into disease process. Alert and Oriented x 3. pleasant and cooperative. Notes Upon inspection patient's wound bed actually showed signs of good granulation epithelization currently. I did perform some debridement to clearway some of the necrotic debris she still has a necrotic area on the distal portion of her foot which unfortunately is still an ongoing issue  here and again something that we need to be very cautious about using Betadine over this area and then collagen over the area of removal of the eschar where it is actually healing on the plantar aspect of the foot. Again this into the foot is good auto amputate but the patient does not want to see a surgeon to discuss any surgical options. That is something I discussed with her often. Electronic Signature(s) Signed: 03/01/2022 10:03:18 AM By: Worthy Keeler PA-C Previous Signature: 03/01/2022 10:03:03 AM Version By: Worthy Keeler PA-C Entered By: Worthy Keeler on 03/01/2022 10:03:18 -------------------------------------------------------------------------------- Physician Orders Details Patient Name: Date of Service: Schartz, [name] RLA M. 03/01/2022 9:30 A M Medical Record Number: [id number] Patient Account Number: [id number] Date of Birth/Sex: Treating RN: 1971-01-19 (52 y.o. Orvan Falconer Primary Care  Provider: Evern Bio Other Clinician: Massie Kluver Referring Provider: Treating Provider/Extender: Darien Ramus Weeks in Treatment: 55 Verbal / Phone Orders: No Diagnosis Coding ICD-10 Coding Code Description M86.371 Chronic multifocal osteomyelitis, right ankle and foot E11.621 Type 2 diabetes mellitus with foot ulcer L97.514 Non-pressure chronic ulcer of other part of right foot with necrosis of bone I50.42 Chronic combined systolic (congestive) and diastolic (congestive) heart failure I10 Essential (primary) hypertension N18.6 End stage renal disease Z99.2 Dependence on renal dialysis Z99.81 Dependence on supplemental oxygen Follow-up Appointments Return Appointment in 3 weeks. Nurse Visit as needed [place] for wound care. May utilize formulary equivalent dressing for wound treatment orders unless otherwise specified. Home Health Nurse may visit [facility] to address patients wound care needs. Scheduled days for dressing changes to be completed; exception, patient has scheduled wound care visit that day. **Please direct any NON-WOUND related issues/requests for orders to patient's Primary Care Physician. **If current dressing causes regression in wound condition, may D/C ordered dressing product/s and apply Normal Saline Moist Dressing daily until next [place] or Other MD appointment. **Notify [facility] of regression in wound condition at [phone]. Bathing/ Shower/ Hygiene Clean wound with Normal Saline or wound cleanser. Wash wounds with antibacterial soap and water. May shower; gently cleanse wound with antibacterial soap, rinse and pat dry prior to dressing wounds No tub bath. Off-Loading Graciano, [name] ([id number]) [id number] Page 5 of 10 Open toe surgical shoe Wound Treatment Wound #2 - Foot Wound Laterality: Right, Circumferential Cleanser:  Normal Saline 1 x Per Day/30 Days Discharge Instructions: Wash your hands with soap and water. Remove old dressing, discard into plastic bag and place into trash. Cleanse the wound with Normal Saline prior to applying a clean dressing using gauze sponges, not tissues or cotton balls. Do not scrub or use excessive force. Pat dry using gauze sponges, not tissue or cotton balls. Cleanser: Soap and Water 1 x Per Day/30 Days Discharge Instructions: Gently cleanse wound with antibacterial soap, rinse and pat dry prior to dressing wounds Topical: Betadine 1 x Per Day/30 Days Discharge Instructions: apply gauze soaked dressing to top of foot and toes Prim Dressing: Gauze 1 x Per Day/30 Days ary Discharge Instructions: As directed: betadine applied Prim Dressing: Prisma 4.34 (in) 1 x Per Day/30 Days ary Discharge Instructions: Moisten w/normal saline or sterile water; Cover open areas of wound as directed Do not remove from wound bed. Secondary Dressing: ABD Pad 5x9 (in/in) 1 x Per Day/30 Days Discharge Instructions: Cover with ABD pad Secured With: Medipore T - 9M Medipore H Soft Cloth Surgical T ape ape, 2x2 (in/yd) 1  x Per Day/30 Days Secured With: Kerlix Roll Sterile or Non-Sterile 6-ply 4.5x4 (yd/yd) 1 x Per Day/30 Days Discharge Instructions: Apply Kerlix as directed Secured With: Stretch Net Dressing, Latex-free, Size 5, Small-Head / Shoulder / Thigh 1 x Per Day/30 Days Electronic Signature(s) Signed: 03/01/2022 1:15:46 PM By: Massie Kluver Signed: 03/01/2022 1:38:11 PM By: Worthy Keeler PA-C Entered By: Massie Kluver on 03/01/2022 09:54:44 -------------------------------------------------------------------------------- Problem List Details Patient Name: Date of Service: [name], [name] RLA M. 03/01/2022 9:30 A M Medical Record Number: [id number] Patient Account Number: [id number] Date of Birth/Sex: Treating RN: [birth year] (52 y.o. Orvan Falconer Primary Care Provider: Evern Bio Other  Clinician: Massie Kluver Referring Provider: Treating Provider/Extender: Darien Ramus Weeks in Treatment: 70 Active Problems ICD-10 Encounter Code Description Active Date MDM Diagnosis M86.371 Chronic multifocal osteomyelitis, right ankle and foot 05/18/2021 No Yes E11.621 Type 2 diabetes mellitus with foot ulcer 05/18/2021 No Yes L97.514 Non-pressure chronic ulcer of other part of right foot with necrosis of bone 05/18/2021 No Yes Penman, [name] ([id number]) [id number] Page 6 of 10 I50.42 Chronic combined systolic (congestive) and diastolic (congestive) heart failure 05/18/2021 No Yes I10 Essential (primary) hypertension 05/18/2021 No Yes N18.6 End stage renal disease 05/18/2021 No Yes Z99.2 Dependence on renal dialysis 05/18/2021 No Yes Z99.81 Dependence on supplemental oxygen 05/18/2021 No Yes Inactive Problems Resolved Problems Electronic Signature(s) Signed: 03/01/2022 9:40:59 AM By: Worthy Keeler PA-C Entered By: Worthy Keeler on 03/01/2022 09:40:59 -------------------------------------------------------------------------------- Progress Note Details Patient Name: Date of Service: Orsak, [name] RLA M. 03/01/2022 9:30 A M Medical Record Number: [id number] Patient Account Number: [id number] Date of Birth/Sex: Treating RN: 1970/10/02 (52 y.o. Orvan Falconer Primary Care Provider: Evern Bio Other Clinician: Massie Kluver Referring Provider: Treating Provider/Extender: Darien Ramus Weeks in Treatment: 89 Subjective Chief Complaint Information obtained from Patient Right foot gangrene and osteomyelitis History of Present Illness (HPI) 10/04/2020 upon evaluation today patient presents for initial inspection here in the clinic concerning a fairly concerning wound over her right fifth toe towards the medial aspect which actually probes all the way down to bone. There is evidence of necrotic muscle as well and I do see  tendon exposed as well. Subsequently this does have me very concerned about where things stand here. Again this is the first time that I am seeing her and on top of everything else her ABIs are noncompressible. I think that arterial flow is the primary concern here. Of note the patient states this was first noted July 1. She had a x-ray on July 4 which was negative for bone infection. She has been on 2 antibiotics she is unsure of the name. With that being said her most recent hemoglobin A1c was 8.1 that was December 2022. She otherwise does have a history obviously of diabetes mellitus type 2, hypertension, end-stage renal disease with dependence on renal dialysis, and she is also dependent on supplemental oxygen. Readmission: 05/14/2021 this is a patient whom I previously seen October 04, 2020. With that being said she is subsequently on 21 October 2020 ended up having an amputation of her fifth digit of her foot unfortunately. This had progressed fairly quickly into what sounds to been gangrene at that time. Subsequently right now based on what I am seeing the patient actually has a necrotic area on the end of her foot and has been recommended that the only option really here is going to be a amputation which would be a below-knee amputation. She is really not  interested in that and wants to try to give this every chance it can to heal. For that reason I discussed with her that definitely we can see what we can do to try to improve this situation and try to help get this doing better as best we can. With that being said there is definitely a portion of her wound that is just not good to be able to heal no matter what it is already starting to auto amputate. Reist, [name] ([id number]) [id number] Page 7 of 10 This includes potentially digits of her foot as well based on what I am seeing. She does have confirmed osteomyelitis noted by MRI. She is also currently on IV  vancomycin. She does undergo dialysis on Tuesday, Thursday, and Saturday. The patient does have a history of diabetes mellitus type 2, congestive heart failure, hypertension, end-stage renal disease, dependence on renal dialysis, and dependence on supplemental oxygen. With that being said I am going to need to look into her heart failure and the significance of this as far as hyperbarics is concerned before we initiate treatment. She is probably also going to need an updated EKG and chest x-ray unless its already been done recently. 05/28/2021 upon evaluation today patient appears to be doing well with regard to her wound all things considered. I am going to try to see what we can do about trimmed away some of the necrotic tissue that is really just hanging on here and there and not really helpful. Feel like if we can slowly start doing this maybe we will be able to get to the point where this is better and better as far as cleaning up the surface of the wound is concerned. Still as I explained I do believe some of her toes are not salvageable but again she is looking more towards autoamputation versus want to do anything definitive here from a surgical standpoint. I have been researching whether she would be an okay candidate for hyperbarics or not and I found that her x-ray but she had a chest x-ray recently was negative for any acute disease and appear to be stable. EKG was also stable. She did also have a cardiac echo which showed that her ejection fraction was 60 to 65% and therefore I think she is a candidate for hyperbarics. Again I do believe that this can help with preventing amputation which right now she has been told that her only option would be a below-knee amputation. Nonetheless I think that if we can get the heel leading to spread and the majority of this cleared away that it is possible we might even be able to get a surgeon just to surgically remove the nonviable toes and not have to go  the full way of complete amputation. Nonetheless we do have a ways to go before we will get to that point. Either way I think the hyperbarics could be an excellent support as far as that is concerned. 06-05-2021 upon evaluation today patient's wound is showing some signs of improvement. This does seem to be slow and of course it is understandable considering what we are seeing. She has discussed and looked up information about hyperbarics and in the end comes back today and states that she does want to proceed. I honestly do feel like that is her best chance at saving the foot. Even if we are able to get her into a surgeon after we obtain some good healing approximately so that there is not as much  necrotic tissue in general she may even be able to have a transmetatarsal amputation and get this to heal. Again I am not saying this is can be an easy road but it may be her best road as far as thinking about how to achieve some type of complete healing as I do not believe the toes are viable at all to be honest. 07-06-2021 upon evaluation today patient appears to be doing well currently in regard to her foot all things considered. Again she unfortunately was unable to do the hyperbaric oxygen therapy this was due to her claustrophobia. Nonetheless she tells me that she is pleased with how the foot appears and she does feel like there is good improvement going on here. In general I think that we are on the right track. If she is going to take a very long time and to be honest the route of autoamputation which is pretty much the way this is going is not a quick or easy road but nonetheless she seems to be taking care of her foot quite well. She is in good spirits. 07-27-2021 upon evaluation today patient appears to be doing well currently in regard to her foot all things considered. A lot of the eschar is starting to lift up around the edges which is good there is still significant necrotic tissue on the distal  portion of the foot. This is slowly going to work its way off. Based on what I am seeing currently I do not believe that there is any evidence of infection which is good news. 08-31-2021 upon evaluation today patient's wound actually appears to be doing decently well. I do not see anything in particular to try to trim away today. With that being said she continues to slowly allow this to do what is going to do as far as autoamputation is concerned. I did have another conversation with her today about the possibility of seeing a surgeon for a transmetatarsal amputation which again would help to get this to close faster and obviously I think would be a much speedy year and safer way to proceed with amputation. With that being said she tells me that she would really prefer to just let this go at "God pace". 09-28-2021 upon evaluation today patient's foot actually appears to be doing much better. Fortunately I do not see any signs of infection which is great news and overall I am extremely pleased with where we stand today. There does not appear to be any signs of active infection locally or systemically which is great news. No fevers, chills, nausea, vomiting, or diarrhea. 10-26-2021 upon evaluation today patient appears to be doing well currently in regard to her foot all things considered. Fortunately I do not see any evidence of active infection locally or systemically at this time which is great news. No fevers, chills, nausea, vomiting, or diarrhea. She does have some of the eschar that is lifting up even a little bit more compared to last time I saw her. 11-30-2021 upon evaluation today patient's wound is still continuing to have some areas that lift up although there is nothing really that I feel like I could trim away too much today to be honest. Fortunately I do not see any evidence of systemic infection though locally she has been concerned about some drainage I do not see anything that seems to be  actively red or erythematous or warm to touch which is good news. No fevers, chills, nausea, vomiting, or diarrhea. 12-21-2021 upon  evaluation patient's foot ulcer actually is showing signs of some improvement with healthier tissue noted at areas with eschar that keeps clearing off. There is a bit of the eschar that is loosening up today as well and I think we will get a be able to get some of this cleared away that we have been working on for a while. Fortunately I do not see any evidence of infection locally or systemically at this time which is great news. No fevers, chills, nausea, vomiting, or diarrhea. 01-11-2022 upon evaluation today patient appears to be doing decently well in regard to her foot she still has a necrotic area on the tip of her foot but she is tolerating the dressing changes without complication. Fortunately there does not appear to be any signs of infection locally or systemically at this time which is great news. No fevers, chills, nausea, vomiting, or diarrhea. 02-01-2022 upon evaluation today patient continues to show signs of improvement in regard to the area where we have been able to remove the eschar. Unfortunately the tip of the foot I think is the part that is completely dead and not going to be coming back as far as any healing is concerned. I have discussed this with the patient multiple times however she does not want to have any type of surgical intervention. Obviously in that regard we will try to do what we can as best we can. 02-15-2022 upon evaluation today patient appears to be doing well with regard to her foot. She is actually showing some signs of improvement she has had some drainage but nothing appears to be doing too poorly overall. I do not see any signs of infection and I am pleased in that regard. Fortunately I do not see any evidence of infection locally nor systemically which is great news. 03-01-2022 upon evaluation today patient appears to be doing  well currently in regard to her wounds. She has been tolerating the dressing changes without complication. Fortunately I do not see any signs of active infection locally nor systemically which is great news. No fevers, chills, nausea, vomiting, or diarrhea. Objective Constitutional Well-nourished and well-hydrated in no acute distress. Vitals Time Taken: 9:25 AM, Temperature: 98.4 F, Pulse: 96 bpm, Respiratory Rate: 18 breaths/min, Blood Pressure: 113/66 mmHg. Sorci, [name] ([id number]) [id number] Page 8 of 10 Respiratory normal breathing without difficulty. Psychiatric this patient is able to make decisions and demonstrates good insight into disease process. Alert and Oriented x 3. pleasant and cooperative. General Notes: Upon inspection patient's wound bed actually showed signs of good granulation epithelization currently. I did perform some debridement to clearway some of the necrotic debris she still has a necrotic area on the distal portion of her foot which unfortunately is still an ongoing issue here and again something that we need to be very cautious about using Betadine over this area and then collagen over the area of removal of the eschar where it is actually healing on the plantar aspect of the foot. Again this into the foot is good auto amputate but the patient does not want to see a surgeon to discuss any surgical options. That is something I discussed with her often. Integumentary (Hair, Skin) Wound #2 status is Open. Original cause of wound was Gradually Appeared. The date acquired was: 10/15/2020. The wound has been in treatment 41 weeks. The wound is located on the Right,Circumferential Foot. The wound measures 8cm length x 8.5cm width x 0.2cm depth; 53.407cm^2 area and 10.681cm^3 volume. There is  Fat Layer (Subcutaneous Tissue) exposed. There is a medium amount of serosanguineous drainage noted. There is medium (34-66%) granulation within the  wound bed. There is a medium (34-66%) amount of necrotic tissue within the wound bed including Eschar and Necrosis of Bone. Assessment Active Problems ICD-10 Chronic multifocal osteomyelitis, right ankle and foot Type 2 diabetes mellitus with foot ulcer Non-pressure chronic ulcer of other part of right foot with necrosis of bone Chronic combined systolic (congestive) and diastolic (congestive) heart failure Essential (primary) hypertension End stage renal disease Dependence on renal dialysis Dependence on supplemental oxygen Procedures Wound #2 Pre-procedure diagnosis of Wound #2 is a Diabetic Wound/Ulcer of the Lower Extremity located on the Right,Circumferential Foot .Severity of Tissue Pre Debridement is: Fat layer exposed. There was a Excisional Skin/Subcutaneous Tissue Debridement with a total area of 68 sq cm performed by Tommie Sams., PA-C. With the following instrument(s): Curette to remove Viable and Non-Viable tissue/material. Material removed includes Eschar, Subcutaneous Tissue, and Slough. A time out was conducted at 09:49, prior to the start of the procedure. A Minimum amount of bleeding was controlled with Pressure. The procedure was tolerated well. Post Debridement Measurements: 8.5cm length x 8.5cm width x 0.2cm depth; 11.349cm^3 volume. Character of Wound/Ulcer Post Debridement is stable. Severity of Tissue Post Debridement is: Fat layer exposed. Post procedure Diagnosis Wound #2: Same as Pre-Procedure Plan Follow-up Appointments: Return Appointment in 3 weeks. Nurse Visit as needed Home Health: [facility] for wound care. May utilize formulary equivalent dressing for wound treatment orders unless otherwise specified. Home Health Nurse may visit PRN to address patientoos wound care needs. Scheduled days for dressing changes to be completed; exception, patient has scheduled wound care visit that day. **Please direct any  NON-WOUND related issues/requests for orders to patient's Primary Care Physician. **If current dressing causes regression in wound condition, may D/C ordered dressing product/s and apply Normal Saline Moist Dressing daily until next [place] or Other MD appointment. **Notify [facility] of regression in wound condition at [phone]. Bathing/ Shower/ Hygiene: Clean wound with Normal Saline or wound cleanser. Wash wounds with antibacterial soap and water. May shower; gently cleanse wound with antibacterial soap, rinse and pat dry prior to dressing wounds No tub bath. Off-Loading: Open toe surgical shoe WOUND #2: - Foot Wound Laterality: Right, Circumferential Cleanser: Normal Saline 1 x Per Day/30 Days Discharge Instructions: Wash your hands with soap and water. Remove old dressing, discard into plastic bag and place into trash. Cleanse the wound with Normal Saline prior to applying a clean dressing using gauze sponges, not tissues or cotton balls. Do not scrub or use excessive force. Pat dry using gauze sponges, not tissue or cotton balls. Cleanser: Soap and Water 1 x Per Day/30 Days Discharge Instructions: Gently cleanse wound with antibacterial soap, rinse and pat dry prior to dressing wounds Topical: Betadine 1 x Per Day/30 Days Discharge Instructions: apply gauze soaked dressing to top of foot and toes Prim Dressing: Gauze 1 x Per Day/30 Days ary Molloy, [name] ([id number]) [id number] Page 9 of 10 Discharge Instructions: As directed: betadine applied Prim Dressing: Prisma 4.34 (in) 1 x Per Day/30 Days ary Discharge Instructions: Moisten w/normal saline or sterile water; Cover open areas of wound as directed Do not remove from wound bed. Secondary Dressing: ABD Pad 5x9 (in/in) 1 x Per Day/30 Days Discharge Instructions: Cover with ABD pad Secured With: Medipore T - 76M Medipore H Soft Cloth Surgical T ape ape, 2x2 (  in/yd) 1 x Per  Day/30 Days Secured With: Kerlix Roll Sterile or Non-Sterile 6-ply 4.5x4 (yd/yd) 1 x Per Day/30 Days Discharge Instructions: Apply Kerlix as directed Secured With: Stretch Net Dressing, Latex-free, Size 5, Small-Head / Shoulder / Thigh 1 x Per Day/30 Days 1. I am good recommend that we have the patient continue to monitor for any signs of infection or worsening. Obviously if she develops any fevers, chills, nausea, vomiting, or diarrhea she is to let me know soon as possible. 2. I am also can recommend that the patient should continue to utilize the silver collagen followed by the ABD pads to cover. 3. I am also going to suggest that she should continue with the stretch netting to hold in place after the roll gauze. We will see patient back for reevaluation in 1 week here in the clinic. If anything worsens or changes patient will contact our office for additional recommendations. Electronic Signature(s) Signed: 03/01/2022 10:03:47 AM By: Worthy Keeler PA-C Entered By: Worthy Keeler on 03/01/2022 10:03:47 -------------------------------------------------------------------------------- SuperBill Details Patient Name: Date of Service: [place], [name] RLA M. 03/01/2022 Medical Record Number: [id number] Patient Account Number: [id number] Date of Birth/Sex: Treating RN: [birth year] (52 y.o. Orvan Falconer Primary Care Provider: Evern Bio Other Clinician: Massie Kluver Referring Provider: Treating Provider/Extender: Darien Ramus Weeks in Treatment: 45 Diagnosis Coding ICD-10 Codes Code Description M86.371 Chronic multifocal osteomyelitis, right ankle and foot E11.621 Type 2 diabetes mellitus with foot ulcer L97.514 Non-pressure chronic ulcer of other part of right foot with necrosis of bone I50.42 Chronic combined systolic (congestive) and diastolic (congestive) heart failure I10 Essential (primary) hypertension N18.6 End stage renal disease Z99.2 Dependence on renal  dialysis Z99.81 Dependence on supplemental oxygen Facility Procedures : 3 CPT4 Code: 1224825 Description: [place] - DEB SUBQ TISSUE 20 SQ CM/< ICD-10 Diagnosis Description L97.514 Non-pressure chronic ulcer of other part of right foot with necrosis of bone Modifier: Quantity: 1 : 3 CPT4 Code: [id number] Description: 11045 - DEB SUBQ TISS EA ADDL 20CM ICD-10 Diagnosis Description L97.514 Non-pressure chronic ulcer of other part of right foot with necrosis of bone Modifier: Quantity: 3 Physician Procedures : CPT4 Code Description Modifier Morss, [name] ([id number]) [id number] Page [id number] 11042 - WC PHYS SUBQ TISS 20 SQ CM 1 ICD-10 Diagnosis Description L97.514 Non-pressure chronic ulcer of other part of right foot with  necrosis of bone Quantity: 10 of 10 : [id number] 11045 - WC PHYS SUBQ TISS EA ADDL 20 CM 3 ICD-10 Diagnosis Description L97.514 Non-pressure chronic ulcer of other part of right foot with necrosis of bone Quantity: Electronic Signature(s) Signed: 03/01/2022 10:04:00 AM By: Worthy Keeler PA-C Entered By: Worthy Keeler on 03/01/2022 10:04:00

## 2022-03-04 ENCOUNTER — Encounter: Payer: Self-pay | Admitting: Oncology

## 2022-03-04 DIAGNOSIS — Z9981 Dependence on supplemental oxygen: Secondary | ICD-10-CM | POA: Diagnosis not present

## 2022-03-04 DIAGNOSIS — Z7951 Long term (current) use of inhaled steroids: Secondary | ICD-10-CM | POA: Diagnosis not present

## 2022-03-04 DIAGNOSIS — E11621 Type 2 diabetes mellitus with foot ulcer: Secondary | ICD-10-CM | POA: Diagnosis not present

## 2022-03-04 DIAGNOSIS — Z79891 Long term (current) use of opiate analgesic: Secondary | ICD-10-CM | POA: Diagnosis not present

## 2022-03-04 DIAGNOSIS — N186 End stage renal disease: Secondary | ICD-10-CM | POA: Diagnosis not present

## 2022-03-04 DIAGNOSIS — Z794 Long term (current) use of insulin: Secondary | ICD-10-CM | POA: Diagnosis not present

## 2022-03-04 DIAGNOSIS — Z792 Long term (current) use of antibiotics: Secondary | ICD-10-CM | POA: Diagnosis not present

## 2022-03-04 DIAGNOSIS — I5032 Chronic diastolic (congestive) heart failure: Secondary | ICD-10-CM | POA: Diagnosis not present

## 2022-03-04 DIAGNOSIS — L089 Local infection of the skin and subcutaneous tissue, unspecified: Secondary | ICD-10-CM | POA: Diagnosis not present

## 2022-03-04 DIAGNOSIS — Z992 Dependence on renal dialysis: Secondary | ICD-10-CM | POA: Diagnosis not present

## 2022-03-04 DIAGNOSIS — I132 Hypertensive heart and chronic kidney disease with heart failure and with stage 5 chronic kidney disease, or end stage renal disease: Secondary | ICD-10-CM | POA: Diagnosis not present

## 2022-03-04 DIAGNOSIS — E1151 Type 2 diabetes mellitus with diabetic peripheral angiopathy without gangrene: Secondary | ICD-10-CM | POA: Diagnosis not present

## 2022-03-04 DIAGNOSIS — Z7952 Long term (current) use of systemic steroids: Secondary | ICD-10-CM | POA: Diagnosis not present

## 2022-03-04 DIAGNOSIS — L97513 Non-pressure chronic ulcer of other part of right foot with necrosis of muscle: Secondary | ICD-10-CM | POA: Diagnosis not present

## 2022-03-04 DIAGNOSIS — E1122 Type 2 diabetes mellitus with diabetic chronic kidney disease: Secondary | ICD-10-CM | POA: Diagnosis not present

## 2022-03-04 DIAGNOSIS — D631 Anemia in chronic kidney disease: Secondary | ICD-10-CM | POA: Diagnosis not present

## 2022-03-05 DIAGNOSIS — N186 End stage renal disease: Secondary | ICD-10-CM | POA: Diagnosis not present

## 2022-03-05 DIAGNOSIS — Z992 Dependence on renal dialysis: Secondary | ICD-10-CM | POA: Diagnosis not present

## 2022-03-06 DIAGNOSIS — J449 Chronic obstructive pulmonary disease, unspecified: Secondary | ICD-10-CM | POA: Diagnosis not present

## 2022-03-07 DIAGNOSIS — N186 End stage renal disease: Secondary | ICD-10-CM | POA: Diagnosis not present

## 2022-03-07 DIAGNOSIS — Z992 Dependence on renal dialysis: Secondary | ICD-10-CM | POA: Diagnosis not present

## 2022-03-08 DIAGNOSIS — I5032 Chronic diastolic (congestive) heart failure: Secondary | ICD-10-CM | POA: Diagnosis not present

## 2022-03-08 DIAGNOSIS — I132 Hypertensive heart and chronic kidney disease with heart failure and with stage 5 chronic kidney disease, or end stage renal disease: Secondary | ICD-10-CM | POA: Diagnosis not present

## 2022-03-08 DIAGNOSIS — E1151 Type 2 diabetes mellitus with diabetic peripheral angiopathy without gangrene: Secondary | ICD-10-CM | POA: Diagnosis not present

## 2022-03-08 DIAGNOSIS — D631 Anemia in chronic kidney disease: Secondary | ICD-10-CM | POA: Diagnosis not present

## 2022-03-08 DIAGNOSIS — E11621 Type 2 diabetes mellitus with foot ulcer: Secondary | ICD-10-CM | POA: Diagnosis not present

## 2022-03-08 DIAGNOSIS — Z794 Long term (current) use of insulin: Secondary | ICD-10-CM | POA: Diagnosis not present

## 2022-03-08 DIAGNOSIS — Z792 Long term (current) use of antibiotics: Secondary | ICD-10-CM | POA: Diagnosis not present

## 2022-03-08 DIAGNOSIS — E1122 Type 2 diabetes mellitus with diabetic chronic kidney disease: Secondary | ICD-10-CM | POA: Diagnosis not present

## 2022-03-08 DIAGNOSIS — Z7951 Long term (current) use of inhaled steroids: Secondary | ICD-10-CM | POA: Diagnosis not present

## 2022-03-08 DIAGNOSIS — L089 Local infection of the skin and subcutaneous tissue, unspecified: Secondary | ICD-10-CM | POA: Diagnosis not present

## 2022-03-08 DIAGNOSIS — Z992 Dependence on renal dialysis: Secondary | ICD-10-CM | POA: Diagnosis not present

## 2022-03-08 DIAGNOSIS — Z7952 Long term (current) use of systemic steroids: Secondary | ICD-10-CM | POA: Diagnosis not present

## 2022-03-08 DIAGNOSIS — N186 End stage renal disease: Secondary | ICD-10-CM | POA: Diagnosis not present

## 2022-03-08 DIAGNOSIS — L97513 Non-pressure chronic ulcer of other part of right foot with necrosis of muscle: Secondary | ICD-10-CM | POA: Diagnosis not present

## 2022-03-08 DIAGNOSIS — Z9981 Dependence on supplemental oxygen: Secondary | ICD-10-CM | POA: Diagnosis not present

## 2022-03-08 DIAGNOSIS — Z79891 Long term (current) use of opiate analgesic: Secondary | ICD-10-CM | POA: Diagnosis not present

## 2022-03-09 DIAGNOSIS — Z992 Dependence on renal dialysis: Secondary | ICD-10-CM | POA: Diagnosis not present

## 2022-03-09 DIAGNOSIS — N186 End stage renal disease: Secondary | ICD-10-CM | POA: Diagnosis not present

## 2022-03-11 DIAGNOSIS — I5032 Chronic diastolic (congestive) heart failure: Secondary | ICD-10-CM | POA: Diagnosis not present

## 2022-03-11 DIAGNOSIS — Z792 Long term (current) use of antibiotics: Secondary | ICD-10-CM | POA: Diagnosis not present

## 2022-03-11 DIAGNOSIS — N186 End stage renal disease: Secondary | ICD-10-CM | POA: Diagnosis not present

## 2022-03-11 DIAGNOSIS — E1122 Type 2 diabetes mellitus with diabetic chronic kidney disease: Secondary | ICD-10-CM | POA: Diagnosis not present

## 2022-03-11 DIAGNOSIS — E11621 Type 2 diabetes mellitus with foot ulcer: Secondary | ICD-10-CM | POA: Diagnosis not present

## 2022-03-11 DIAGNOSIS — L97513 Non-pressure chronic ulcer of other part of right foot with necrosis of muscle: Secondary | ICD-10-CM | POA: Diagnosis not present

## 2022-03-11 DIAGNOSIS — L089 Local infection of the skin and subcutaneous tissue, unspecified: Secondary | ICD-10-CM | POA: Diagnosis not present

## 2022-03-11 DIAGNOSIS — E1151 Type 2 diabetes mellitus with diabetic peripheral angiopathy without gangrene: Secondary | ICD-10-CM | POA: Diagnosis not present

## 2022-03-11 DIAGNOSIS — Z7952 Long term (current) use of systemic steroids: Secondary | ICD-10-CM | POA: Diagnosis not present

## 2022-03-11 DIAGNOSIS — D631 Anemia in chronic kidney disease: Secondary | ICD-10-CM | POA: Diagnosis not present

## 2022-03-11 DIAGNOSIS — Z79891 Long term (current) use of opiate analgesic: Secondary | ICD-10-CM | POA: Diagnosis not present

## 2022-03-11 DIAGNOSIS — I132 Hypertensive heart and chronic kidney disease with heart failure and with stage 5 chronic kidney disease, or end stage renal disease: Secondary | ICD-10-CM | POA: Diagnosis not present

## 2022-03-11 DIAGNOSIS — Z992 Dependence on renal dialysis: Secondary | ICD-10-CM | POA: Diagnosis not present

## 2022-03-11 DIAGNOSIS — Z9981 Dependence on supplemental oxygen: Secondary | ICD-10-CM | POA: Diagnosis not present

## 2022-03-11 DIAGNOSIS — Z7951 Long term (current) use of inhaled steroids: Secondary | ICD-10-CM | POA: Diagnosis not present

## 2022-03-11 DIAGNOSIS — Z794 Long term (current) use of insulin: Secondary | ICD-10-CM | POA: Diagnosis not present

## 2022-03-12 DIAGNOSIS — Z992 Dependence on renal dialysis: Secondary | ICD-10-CM | POA: Diagnosis not present

## 2022-03-12 DIAGNOSIS — N186 End stage renal disease: Secondary | ICD-10-CM | POA: Diagnosis not present

## 2022-03-12 NOTE — Progress Notes (Deleted)
Cardiology Office Note  Date:  03/12/2022   ID:  [name], DOB May 22, 1970, MRN [id number]  PCP:  [name], NP   No chief complaint on file.   HPI:  [name] is a 52 yo woman with medical history of  morbidly obese  uncontrolled diabetes   chronic diastolic CHF,  anemia,  Asthma Morbid obesity ESRD on HD 3w a week, started 03/2017 Osteomyelitis foot Moderate mitral valve stenosis/pulmonary hypertension On chronic oxygen who presents for  follow-up of her diastolic CHF.  Last seen in clinic 8/23 Low pressure on HD, Getting orthostasis symptoms on HD Sometimes stops filtration when blood pressure running low  Off ozempic Working on her weight  Lab work reviewed Total cholesterol greater than 230, LDL 160  EKG personally reviewed by myself on todays visit Sinus tachycardia rate 111 bpm poor R wave progression  Echo: Reviewed  1. Left ventricular ejection fraction, by estimation, is 60 to 65%. The  left ventricle has normal function. The left ventricle has no regional  wall motion abnormalities. The left ventricular internal cavity size was  not well visualized. Left ventricular   diastolic function could not be evaluated.   2. Right ventricular systolic function is low normal. The right  ventricular size is normal. Mildly increased right ventricular wall  thickness. There is moderately elevated pulmonary artery systolic  pressure. The estimated right ventricular systolic  pressure is [id number] mmHg.   3. Left atrial size was moderately dilated.   4. Right atrial size was mildly dilated.   5. The mitral valve is abnormal. Trivial mitral valve regurgitation.  Moderate to severe mitral stenosis. The mean mitral valve gradient is 8.0  mmHg. Moderate mitral annular calcification.   6. Tricuspid valve regurgitation is moderate.   7. The aortic valve was not well visualized. Aortic valve regurgitation  is not visualized. No aortic stenosis is present.    8. The inferior vena cava is normal in size with <50% respiratory  variability, suggesting right atrial pressure of 8 mmHg.   Comparison(s): EF 60%, mild LVH, RVSP 78.71mHg.   Covid 02/2019,   Echocardiogram September 2022 Ejection fraction 60 to [id number] grade 2 diastolic dysfunction Severely elevated right heart pressures/pulmonary hypertension 78 mmHg  Followed by Dr. GPatsey Bertholdfor her pulmonary issues, asthmatic bronchitis  Hospitalized April 21, 2021 for pancreatitis, abdominal pain  Lab work reviewed A1c improved 6.2 down from 8-10  Dr. SCandiss Norsefollows ESRD  Trying to get out of many of her medications  stopped celexa, stopped statin on her own Reports she is out of her clonidine and metoprolol, taking these But does report blood pressure has been running high at times, not on dialysis days  Prior hospitalization records reviewed  hospitalized 10/15/2020 to 11/10/2020 secondary to gangrene of the right foot with osteomyelitis status post fifth ray amputation and I&D on 8/27 as well as washout procedure 9/1.  She was recommended for IV vancomycin, cefepime for 4 weeks, end date 9/28.  Readmission for CHF in 9/22  On chronic oxygen, 3 liters, followed by gonzalez  Hx of diabetic foot ulcer requiring prolonged period of antibiotics Details per ID notes  August  2022 was in the hospital with right diabetic foot infection with necrosis and osteomyelitis.  She underwent amputation of the fifth ray on 10/21/2020.  This was followed by a washout procedure on 10/26/2020.  As the foot did not appear salvageable amputation was recommended but podiatrist.  She declined.  She took IV vancomycin  and cefepime for 8 weeks until 12/19/2020 during dialysis. Diabetic foot infection- necrotic with osteomyelitis- amputation of fifth ray and I&D on 8/27 -Washout procedure on 10/26/2020 -Recommended amputation which patient had declined  -Took  IV vancomycin and cefepime with dialysis -for 8 weeks until  12/19/20 I had seen her on 02/22/2021 and recommended no antibiotics as there was no signs of acute infection. -Repeat MRI February 2023 showing osteo  PMH:   has a past medical history of (HFpEF) heart failure with preserved ejection fraction (HCC), Anxiety, Asthma, Bronchitis, CHF (congestive heart failure) ([place]), Chronic cough, Chronic respiratory failure with hypoxia and hypercapnia ([place]), CKD (chronic kidney disease), stage III ([place]), Depression, Diabetes mellitus without complication ([place]), Diverticulosis, Dyspnea, Dysrhythmia, Environmental and seasonal allergies, Extreme obesity with alveolar hypoventilation ([place]), Hypertension, Iron deficiency anemia, Kidney failure, Orthopnea, Oxygen dependent, Pericardial effusion, Pneumonia, Poorly controlled diabetes mellitus ([place]), and Swelling of both lower extremities.  PSH:    Past Surgical History:  Procedure Laterality Date   A/V FISTULAGRAM Left 06/09/2017   Procedure: A/V FISTULAGRAM;  Surgeon: Algernon Huxley, MD;  Location: [facility];  Service: Cardiovascular;  Laterality: Left;   A/V FISTULAGRAM Left 06/16/2017   Procedure: A/V FISTULAGRAM;  Surgeon: Algernon Huxley, MD;  Location: [facility];  Service: Cardiovascular;  Laterality: Left;   A/V FISTULAGRAM Left 01/28/2018   Procedure: A/V FISTULAGRAM;  Surgeon: Algernon Huxley, MD;  Location: [facility];  Service: Cardiovascular;  Laterality: Left;   AMPUTATION TOE Right 10/21/2020   Procedure: AMPUTATION TOE-5th Digit Amputation Right Foot;  Surgeon: Edrick Kins, DPM;  Location: [facility];  Service: Podiatry;  Laterality: Right;   AV FISTULA PLACEMENT Left 04/25/2017   Procedure: ARTERIOVENOUS (AV) FISTULA CREATION;  Surgeon: Algernon Huxley, MD;  Location: [facility];  Service: Vascular;  Laterality: Left;   BONE BIOPSY Right 04/27/2021   Procedure: Right 4th metatarsal BONE BIOPSY;  Surgeon: [name], DPM;  Location: [facility];  Service: Podiatry;  Laterality: Right;    CATARACT EXTRACTION W/PHACO Left 01/21/2018   Procedure: CATARACT EXTRACTION PHACO AND INTRAOCULAR LENS PLACEMENT (IOC);  Surgeon: Leandrew Koyanagi, MD;  Location: [facility];  Service: Ophthalmology;  Laterality: Left;  [place] 01:55.5 CDE 9.05 EAUP 13.9 Fluid Pack Lot # [id number]   CESAREAN SECTION     times 3   DIALYSIS/PERMA CATHETER INSERTION Right 04/04/2017   Procedure: DIALYSIS/PERMA CATHETER INSERTION;  Surgeon: Katha Cabal, MD;  Location: [facility];  Service: Cardiovascular;  Laterality: Right;   DIALYSIS/PERMA CATHETER REMOVAL N/A 09/17/2017   Procedure: DIALYSIS/PERMA CATHETER REMOVAL;  Surgeon: Algernon Huxley, MD;  Location: [facility];  Service: Cardiovascular;  Laterality: N/A;   INCISION AND DRAINAGE Right 10/26/2020   Procedure: INCISION AND DRAINAGE;  Surgeon: [name], DPM;  Location: [facility];  Service: Podiatry;  Laterality: Right;   INSERTION OF DIALYSIS CATHETER N/A 04/16/2017   Procedure: INSERTION OF DIALYSIS PERM CATHETER;  Surgeon: Algernon Huxley, MD;  Location: [facility];  Service: Vascular;  Laterality: N/A;   LOWER EXTREMITY ANGIOGRAPHY Right 10/18/2020   Procedure: Lower Extremity Angiography;  Surgeon: Algernon Huxley, MD;  Location: [facility];  Service: Cardiovascular;  Laterality: Right;    Current Outpatient Medications  Medication Sig Dispense Refill   acetaminophen (TYLENOL) 500 MG tablet Take 1,000 mg by mouth every 6 (six) hours as needed for mild pain, fever or headache.     albuterol (PROVENTIL) (2.5 MG/3ML)  0.083% nebulizer solution Take 3 mLs (2.5 mg total) by nebulization every 6 (six) hours as needed for wheezing or shortness of breath. 75 mL 11   albuterol (VENTOLIN HFA) 108 (90 Base) MCG/ACT inhaler Inhale 2 puffs into the lungs every 6 (six) hours as needed for wheezing or shortness of breath. 18 g 2   aspirin EC 81 MG tablet Take 81 mg by mouth daily.      atorvastatin (LIPITOR) 40 MG tablet Take 1 tablet (40 mg  total) by mouth daily. 90 tablet 3   b complex-vitamin c-folic acid (NEPHRO-VITE) 0.8 MG TABS tablet Take 1 tablet by mouth at bedtime.     budesonide (PULMICORT) 0.25 MG/2ML nebulizer solution Take 2 mLs (0.25 mg total) by nebulization 2 (two) times daily. 60 mL 6   budesonide (PULMICORT) 0.5 MG/2ML nebulizer solution Take by nebulization.     Cholecalciferol 100 MCG (4000 UT) CAPS Take 1 capsule by mouth once a week.     clindamycin (CLEOCIN T) 1 % lotion Apply 1 application topically daily.     ferric citrate (AURYXIA) 1 GM 210 MG(Fe) tablet Take 210 mg by mouth 3 (three) times daily with meals. Only taking once a day most days     formoterol (PERFOROMIST) 20 MCG/2ML nebulizer solution      insulin degludec (TRESIBA) 100 UNIT/ML FlexTouch Pen Inject 30 Units into the skin daily.     insulin lispro (HUMALOG) 100 UNIT/ML KwikPen Inject into the skin.     lactulose (CHRONULAC) 10 GM/15ML solution Take 30 mLs (20 g total) by mouth daily as needed for severe constipation. 240 mL 0   lidocaine-prilocaine (EMLA) cream Apply topically as needed (For access for dialysis).     LINZESS 290 MCG CAPS capsule Take 290 mcg by mouth every morning.     meloxicam (MOBIC) 15 MG tablet Take 1 tablet (15 mg total) by mouth daily as needed for pain. 30 tablet 2   metoprolol tartrate (LOPRESSOR) 50 MG tablet Take 1 tablet (50 mg total) by mouth 2 (two) times daily. (Patient not taking: Reported on 02/27/2022) 180 tablet 3   midodrine (PROAMATINE) 10 MG tablet Take 20 mg by mouth daily as needed.     montelukast (SINGULAIR) 10 MG tablet Take 1 tablet (10 mg total) by mouth at bedtime. 30 tablet 3   Omeprazole Magnesium (PRILOSEC PO)      OXYGEN Inhale 2-4 L into the lungs.     pregabalin (LYRICA) 50 MG capsule Take 100 mg by mouth every 8 (eight) hours as needed.     RENVELA 2.4 g PACK Take 2.4 g by mouth 3 (three) times daily.     revefenacin (YUPELRI) 175 MCG/3ML nebulizer solution      No current  facility-administered medications for this visit.    Allergies:   Dust mite extract, Mold extract [trichophyton], Pollen extract, Ozempic (0.25 or 0.5 mg-dose) [semaglutide(0.25 or 0.81m-dos)], Sulfa antibiotics, Misc. sulfonamide containing compounds, and Feraheme [ferumoxytol]   Social History:  The patient  reports that she has never smoked. She has been exposed to tobacco smoke. She has never used smokeless tobacco. She reports that she does not currently use alcohol. She reports current drug use. Drug: Marijuana.   Family History:   family history includes Breast cancer (age of onset: 522 in her maternal aunt and maternal aunt; Diabetes in her father, mother, and another family member; Hypertension in her father, mother, and another family member.    Review of Systems: Review of Systems  Constitutional: Negative.   HENT: Negative.    Respiratory:  Positive for shortness of breath.   Cardiovascular: Negative.   Gastrointestinal: Negative.   Musculoskeletal: Negative.   Neurological: Negative.   Psychiatric/Behavioral: Negative.    All other systems reviewed and are negative.   PHYSICAL EXAM: VS:  LMP 10/14/2012 (Approximate)  , BMI There is no height or weight on file to calculate BMI. Constitutional:  oriented to person, place, and time. No distress.  In a wheelchair HENT:  Head: Grossly normal Eyes:  no discharge. No scleral icterus.  Neck: No JVD, no carotid bruits  Cardiovascular: Regular rate and rhythm, no murmurs appreciated Pulmonary/Chest: Clear to auscultation bilaterally, no wheezes or rails Abdominal: Soft.  no distension.  no tenderness.  Musculoskeletal: Normal range of motion Neurological:  normal muscle tone. Coordination normal. No atrophy Skin: Skin warm and dry Psychiatric: normal affect, pleasant   Recent Labs: 05/01/2021: ALT 18 12/02/2021: BUN 62; Creatinine, Ser 8.95; Hemoglobin 12.8; Platelets 308; Potassium 4.1; Sodium 133    Lipid Panel Lab  Results  Component Value Date   CHOL 232 (H) 01/03/2015   HDL 41 01/03/2015   LDLCALC 169 (H) 01/03/2015   TRIG 151 (H) 04/21/2021     Wt Readings from Last 3 Encounters:  02/27/22 273 lb (123.8 kg)  02/15/22 262 lb (118.8 kg)  12/04/21 262 lb 5.6 oz (119 kg)    ASSESSMENT AND PLAN:   Chronic diastolic congestive heart failure (HCC) - Fluid managed by hemodialysis Echocardiogram in 2022 with markedly elevated right heart pressures greater than 70 Repeat echocardiogram with improved right heart pressures but still moderately elevated 54 In the setting of end-stage renal disease on hemodialysis and moderate to severe mitral valve stenosis  Pulmonary hypertension In the setting of end-stage renal disease on hemodialysis, mitral valve stenosis, obesity hypoventilation Mildly improved right heart pressure numbers on recent echocardiogram  Essential hypertension -  Orthostatic hypotension during dialysis and after treatment Blood pressure running very low during dialysis and following as demonstrated today Recommended midodrine 20 mg before dialysis Tuesday Thursday Saturday, may need midodrine 10 mg at noon following dialysis  Asthma, unspecified asthma severity,  Prior Covid infection Pulmonary hypertension, obesity hypoventilation On oxygen  Morbid obesity due to excess calories ([place]) History of poor diet No regular exercise program  Poorly controlled type 2 diabetes mellitus (HCC) Hemoglobin A1c chronically elevated , though improved recently Improved A1c down to 6.2 recently February 2023  Anemia Managed by nephrology  Mitral valve stenosis Mean gradient of 8 on echocardiogram, images pulled up and reviewed Thickened leaflets on echo Repeat echocardiogram in 1 year recommended Would be a challenging candidate for surgical intervention Results discussed on today's visit   Total encounter time more than 30 minutes  Greater than 50% was spent in counseling and  coordination of care with the patient     No orders of the defined types were placed in this encounter.    Signed, Esmond Plants, M.D., Ph.D. 03/12/2022  [place], [place]

## 2022-03-13 ENCOUNTER — Telehealth: Payer: Self-pay | Admitting: Pulmonary Disease

## 2022-03-13 ENCOUNTER — Ambulatory Visit: Payer: Medicare Other | Admitting: Cardiovascular Disease

## 2022-03-13 NOTE — Telephone Encounter (Signed)
Patient called to inform the nurse that [facility] still has not sent her bipap.  She stated it has been about 3 weeks.  Please advise.  CB# [phone]

## 2022-03-13 NOTE — Telephone Encounter (Signed)
I spoke with [name] with [employer]. He stated that they have the PA obtained from [place] will call the patient to schedule picking up the Bipap machine. They are waiting on a specific mask that the patient stated she wanted on 03/01/22.

## 2022-03-13 NOTE — Telephone Encounter (Signed)
Rodena Piety can you check on this? Thank you!

## 2022-03-14 DIAGNOSIS — N186 End stage renal disease: Secondary | ICD-10-CM | POA: Diagnosis not present

## 2022-03-14 DIAGNOSIS — Z992 Dependence on renal dialysis: Secondary | ICD-10-CM | POA: Diagnosis not present

## 2022-03-15 ENCOUNTER — Encounter: Payer: 59 | Admitting: Physician Assistant

## 2022-03-15 DIAGNOSIS — I5042 Chronic combined systolic (congestive) and diastolic (congestive) heart failure: Secondary | ICD-10-CM | POA: Diagnosis not present

## 2022-03-15 DIAGNOSIS — I132 Hypertensive heart and chronic kidney disease with heart failure and with stage 5 chronic kidney disease, or end stage renal disease: Secondary | ICD-10-CM | POA: Diagnosis not present

## 2022-03-15 DIAGNOSIS — L97513 Non-pressure chronic ulcer of other part of right foot with necrosis of muscle: Secondary | ICD-10-CM | POA: Diagnosis not present

## 2022-03-15 DIAGNOSIS — M86371 Chronic multifocal osteomyelitis, right ankle and foot: Secondary | ICD-10-CM | POA: Diagnosis not present

## 2022-03-15 DIAGNOSIS — E11621 Type 2 diabetes mellitus with foot ulcer: Secondary | ICD-10-CM | POA: Diagnosis not present

## 2022-03-15 DIAGNOSIS — E1169 Type 2 diabetes mellitus with other specified complication: Secondary | ICD-10-CM | POA: Diagnosis not present

## 2022-03-15 DIAGNOSIS — N186 End stage renal disease: Secondary | ICD-10-CM | POA: Diagnosis not present

## 2022-03-15 DIAGNOSIS — Z9981 Dependence on supplemental oxygen: Secondary | ICD-10-CM | POA: Diagnosis not present

## 2022-03-15 DIAGNOSIS — L97514 Non-pressure chronic ulcer of other part of right foot with necrosis of bone: Secondary | ICD-10-CM | POA: Diagnosis not present

## 2022-03-15 DIAGNOSIS — Z992 Dependence on renal dialysis: Secondary | ICD-10-CM | POA: Diagnosis not present

## 2022-03-15 DIAGNOSIS — E1122 Type 2 diabetes mellitus with diabetic chronic kidney disease: Secondary | ICD-10-CM | POA: Diagnosis not present

## 2022-03-15 NOTE — Progress Notes (Addendum)
Masso, [name] ([id number]) [id number] Page 1 of 10 Visit Report for 03/15/2022 Chief Complaint Document Details Patient Name: Date of Service: [place], [place] [name]. 03/15/2022 8:30 A [name] Medical Record Number: [id number] Patient Account Number: [id number] Date of Birth/Sex: Treating RN: 1970/07/05 (52 y.o. [name] Primary Care Provider: Evern Bio Other Clinician: Massie Kluver Referring Provider: Treating Provider/Extender: Darien Ramus Weeks in Treatment: 66 Information Obtained from: Patient Chief Complaint Right foot gangrene and osteomyelitis Electronic Signature(s) Signed: 03/15/2022 8:31:52 AM By: Worthy Keeler PA-C Entered By: Worthy Keeler on 03/15/2022 08:31:51 -------------------------------------------------------------------------------- Debridement Details Patient Name: Date of Service: [place], [place] [name]. 03/15/2022 8:30 A [name] Medical Record Number: [id number] Patient Account Number: [id number] Date of Birth/Sex: Treating RN: [birth year] (52 y.o. [name] Primary Care Provider: Evern Bio Other Clinician: Massie Kluver Referring Provider: Treating Provider/Extender: Darien Ramus Weeks in Treatment: 10 Debridement Performed for Assessment: Wound #2 Right,Circumferential Foot Performed By: Physician Tommie Sams., PA-C Debridement Type: Debridement Severity of Tissue Pre Debridement: Necrosis of muscle Level of Consciousness (Pre-procedure): Awake and Alert Pre-procedure Verification/Time Out Yes - 09:10 Taken: Start Time: 09:10 T Area Debrided (L x W): otal 3 (cm) x 3 (cm) = 9 (cm) Tissue and other material debrided: Viable, Non-Viable, Eschar, Slough, Subcutaneous, Tendon, Slough Level: Skin/Subcutaneous Tissue/Muscle Debridement Description: Excisional Instrument: Curette Bleeding: Minimum Hemostasis Achieved: Pressure Response to Treatment: Procedure was tolerated well Level of  Consciousness (Post- Awake and Alert procedure): Masih, [name] ([id number]) [id number] Page 2 of 10 Post Debridement Measurements of Total Wound Length: (cm) 13.7 Width: (cm) 8 Depth: (cm) 0.2 Volume: (cm) 17.216 Character of Wound/Ulcer Post Debridement: Stable Severity of Tissue Post Debridement: Necrosis of muscle Post Procedure Diagnosis Same as Pre-procedure Electronic Signature(s) Signed: 03/15/2022 9:50:53 AM By: Massie Kluver Signed: 03/15/2022 1:44:04 PM By: Gretta Cool, BSN, RN, CWS, Kim RN, BSN Signed: 03/15/2022 2:37:27 PM By: Worthy Keeler PA-C Entered By: Massie Kluver on 03/15/2022 09:19:08 -------------------------------------------------------------------------------- HPI Details Patient Name: Date of Service: [place] [name]. 03/15/2022 8:30 A [name] Medical Record Number: [id number] Patient Account Number: [id number] Date of Birth/Sex: Treating RN: [birth year] (52 y.o. [name] Primary Care Provider: Evern Bio Other Clinician: Massie Kluver Referring Provider: Treating Provider/Extender: Darien Ramus Weeks in Treatment: 35 History of Present Illness HPI Description: 10/04/2020 upon evaluation today patient presents for initial inspection here in the clinic concerning a fairly concerning wound over her right fifth toe towards the medial aspect which actually probes all the way down to bone. There is evidence of necrotic muscle as well and I do see tendon exposed as well. Subsequently this does have me very concerned about where things stand here. Again this is the first time that I am seeing her and on top of everything else her ABIs are noncompressible. I think that arterial flow is the primary concern here. Of note the patient states this was first noted July 1. She had a x-ray on July 4 which was negative for bone infection. She has been on 2 antibiotics she is unsure of the name. With that being said her most  recent hemoglobin A1c was 8.1 that was December 2022. She otherwise does have a history obviously of diabetes mellitus type 2, hypertension, end-stage renal disease with dependence on renal dialysis, and she is also dependent on supplemental oxygen. Readmission: 05/14/2021 this is a patient whom I previously seen October 04, 2020. With that being said she is subsequently on 21 October 2020 ended up having an amputation of her fifth digit of her foot unfortunately. This had progressed fairly quickly into what sounds to been gangrene at that time. Subsequently right now based on what I am seeing the patient actually has a necrotic area on the end of her foot and has been recommended that the only option really here is going to be a amputation which would be a below-knee amputation. She is really not interested in that and wants to try to give this every chance it can to heal. For that reason I discussed with her that definitely we can see what we can do to try to improve this situation and try to help get this doing better as best we can. With that being said there is definitely a portion of her wound that is just not good to be able to heal no matter what it is already starting to auto amputate. This includes potentially digits of her foot as well based on what I am seeing. She does have confirmed osteomyelitis noted by MRI. She is also currently on IV vancomycin. She does undergo dialysis on Tuesday, Thursday, and Saturday. The patient does have a history of diabetes mellitus type 2, congestive heart failure, hypertension, end-stage renal disease, dependence on renal dialysis, and dependence on supplemental oxygen. With that being said I am going to need to look into her heart failure and the significance of this as far as hyperbarics is concerned before we initiate treatment. She is probably also going to need an updated EKG and chest x-ray unless its already been done recently. 05/28/2021 upon evaluation  today patient appears to be doing well with regard to her wound all things considered. I am going to try to see what we can do about trimmed away some of the necrotic tissue that is really just hanging on here and there and not really helpful. Feel like if we can slowly start doing this maybe we will be able to get to the point where this is better and better as far as cleaning up the surface of the wound is concerned. Still as I explained I do believe some of her toes are not salvageable but again she is looking more towards autoamputation versus want to do anything definitive here from a surgical standpoint. I have been researching whether she would be an okay candidate for hyperbarics or not and I found that her x-ray but she had a chest x-ray recently was negative for any acute disease and appear to be stable. EKG was also stable. She did also have a cardiac echo which showed that her ejection fraction was 60 to 65% and therefore I think she is a candidate for hyperbarics. Again I do believe that this can help with preventing amputation which right now she has been told that her only option would be a below-knee amputation. Nonetheless I think that if we can get the heel leading to spread and the majority of this cleared away that it is possible we might even be able to get a surgeon just to surgically remove the nonviable toes and not have to go the full way of complete amputation. Nonetheless we do have a ways to go before we will get to that point. Either way I think the hyperbarics could be an excellent support as far as that is concerned. 06-05-2021 upon evaluation today patient's wound is showing some signs of improvement. This does seem to be slow and of course it is understandable considering what  we are seeing. She has discussed and looked up information about hyperbarics and in the end comes back today and states that she does [name], [name] ([id number])  [id number] Page 3 of 10 want to proceed. I honestly do feel like that is her best chance at saving the foot. Even if we are able to get her into a surgeon after we obtain some good healing approximately so that there is not as much necrotic tissue in general she may even be able to have a transmetatarsal amputation and get this to heal. Again I am not saying this is can be an easy road but it may be her best road as far as thinking about how to achieve some type of complete healing as I do not believe the toes are viable at all to be honest. 07-06-2021 upon evaluation today patient appears to be doing well currently in regard to her foot all things considered. Again she unfortunately was unable to do the hyperbaric oxygen therapy this was due to her claustrophobia. Nonetheless she tells me that she is pleased with how the foot appears and she does feel like there is good improvement going on here. In general I think that we are on the right track. If she is going to take a very long time and to be honest the route of autoamputation which is pretty much the way this is going is not a quick or easy road but nonetheless she seems to be taking care of her foot quite well. She is in good spirits. 07-27-2021 upon evaluation today patient appears to be doing well currently in regard to her foot all things considered. A lot of the eschar is starting to lift up around the edges which is good there is still significant necrotic tissue on the distal portion of the foot. This is slowly going to work its way off. Based on what I am seeing currently I do not believe that there is any evidence of infection which is good news. 08-31-2021 upon evaluation today patient's wound actually appears to be doing decently well. I do not see anything in particular to try to trim away today. With that being said she continues to slowly allow this to do what is going to do as far as autoamputation is  concerned. I did have another conversation with her today about the possibility of seeing a surgeon for a transmetatarsal amputation which again would help to get this to close faster and obviously I think would be a much speedy year and safer way to proceed with amputation. With that being said she tells me that she would really prefer to just let this go at "God pace". 09-28-2021 upon evaluation today patient's foot actually appears to be doing much better. Fortunately I do not see any signs of infection which is great news and overall I am extremely pleased with where we stand today. There does not appear to be any signs of active infection locally or systemically which is great news. No fevers, chills, nausea, vomiting, or diarrhea. 10-26-2021 upon evaluation today patient appears to be doing well currently in regard to her foot all things considered. Fortunately I do not see any evidence of active infection locally or systemically at this time which is great news. No fevers, chills, nausea, vomiting, or diarrhea. She does have some of the eschar that is lifting up even a little bit more compared to last time I saw her. 11-30-2021 upon evaluation today patient's wound is still continuing  to have some areas that lift up although there is nothing really that I feel like I could trim away too much today to be honest. Fortunately I do not see any evidence of systemic infection though locally she has been concerned about some drainage I do not see anything that seems to be actively red or erythematous or warm to touch which is good news. No fevers, chills, nausea, vomiting, or diarrhea. 12-21-2021 upon evaluation patient's foot ulcer actually is showing signs of some improvement with healthier tissue noted at areas with eschar that keeps clearing off. There is a bit of the eschar that is loosening up today as well and I think we will get a be able to get some of this cleared away that we have been working  on for a while. Fortunately I do not see any evidence of infection locally or systemically at this time which is great news. No fevers, chills, nausea, vomiting, or diarrhea. 01-11-2022 upon evaluation today patient appears to be doing decently well in regard to her foot she still has a necrotic area on the tip of her foot but she is tolerating the dressing changes without complication. Fortunately there does not appear to be any signs of infection locally or systemically at this time which is great news. No fevers, chills, nausea, vomiting, or diarrhea. 02-01-2022 upon evaluation today patient continues to show signs of improvement in regard to the area where we have been able to remove the eschar. Unfortunately the tip of the foot I think is the part that is completely dead and not going to be coming back as far as any healing is concerned. I have discussed this with the patient multiple times however she does not want to have any type of surgical intervention. Obviously in that regard we will try to do what we can as best we can. 02-15-2022 upon evaluation today patient appears to be doing well with regard to her foot. She is actually showing some signs of improvement she has had some drainage but nothing appears to be doing too poorly overall. I do not see any signs of infection and I am pleased in that regard. Fortunately I do not see any evidence of infection locally nor systemically which is great news. 03-01-2022 upon evaluation today patient appears to be doing well currently in regard to her wounds. She has been tolerating the dressing changes without complication. Fortunately I do not see any signs of active infection locally nor systemically which is great news. No fevers, chills, nausea, vomiting, or diarrhea. 03-15-2022 upon evaluation today patient's wound continues to show signs of loosening the splint some of the eschar is concerned on the tip of her foot. Fortunately I do not see any  evidence of infection systemically which is good news locally I feel like she is making good progress here. Overall I am extremely happy with where things stand. Especially considering the fact that really she does need to have an amputation of the necrotic portion of the end of her foot. Again if we got rid of those toes I do think that this could actually heal much more effectively and quickly with that being said she is completely against going down that road so we will try to do what we can as conservatively as we can while still making some progress here. Electronic Signature(s) Signed: 03/15/2022 10:44:43 AM By: Worthy Keeler PA-C Entered By: Worthy Keeler on 03/15/2022 10:44:42 -------------------------------------------------------------------------------- Physical Exam Details Patient Name: Date of  Service: [place], [place] [name]. 03/15/2022 8:30 A [name] Medical Record Number: [id number] Patient Account Number: [id number] Date of Birth/Sex: Treating RN: 12/30/1970 (52 y.o. [name] Primary Care Provider: Evern Bio Other Clinician: Massie Kluver Referring Provider: Treating Provider/Extender: Darien Ramus [place], [place] ([id number]) [id number] Page 4 of 10 Weeks in Treatment: 101 Constitutional Well-nourished and well-hydrated in no acute distress. Respiratory normal breathing without difficulty. Psychiatric this patient is able to make decisions and demonstrates good insight into disease process. Alert and Oriented x 3. pleasant and cooperative. Notes Upon inspection patient's wound bed actually showed signs again of some eschar that was able to remove towards the distal portion of her foot although not all of this. And underneath there was some exposed tendon although this has been the case along the bottom of the foot altogether she is slowly seeing signs of improvement as far as granulation tissue coming and is concerned and  covered over these areas. We have been using collagen over this spot. Electronic Signature(s) Signed: 03/15/2022 10:45:09 AM By: Worthy Keeler PA-C Entered By: Worthy Keeler on 03/15/2022 10:45:09 -------------------------------------------------------------------------------- Physician Orders Details Patient Name: Date of Service: [place] [name]. 03/15/2022 8:30 A [name] Medical Record Number: [id number] Patient Account Number: [id number] Date of Birth/Sex: Treating RN: 28-Jun-1970 (52 y.o. [name] Primary Care Provider: Evern Bio Other Clinician: Massie Kluver Referring Provider: Treating Provider/Extender: Darien Ramus Weeks in Treatment: 30 Verbal / Phone Orders: No Diagnosis Coding ICD-10 Coding Code Description M86.371 Chronic multifocal osteomyelitis, right ankle and foot E11.621 Type 2 diabetes mellitus with foot ulcer L97.514 Non-pressure chronic ulcer of other part of right foot with necrosis of bone I50.42 Chronic combined systolic (congestive) and diastolic (congestive) heart failure I10 Essential (primary) hypertension N18.6 End stage renal disease Z99.2 Dependence on renal dialysis Z99.81 Dependence on supplemental oxygen Follow-up Appointments Return Appointment in 3 weeks. Nurse Visit as needed [facility] for wound care. May utilize formulary equivalent dressing for wound treatment orders unless otherwise specified. Home Health Nurse may visit [facility] to address patients wound care needs. Scheduled days for dressing changes to be completed; exception, patient has scheduled wound care visit that day. **Please direct any NON-WOUND related issues/requests for orders to patient's Primary Care Physician. **If current dressing causes regression in wound condition, may D/C ordered dressing product/s and apply Normal Saline Moist Dressing daily until next [place] or Other MD  appointment. **Notify [facility] of regression in wound condition at [phone]. Bathing/ Shower/ Hygiene Clean wound with Normal Saline or wound cleanser. Wash wounds with antibacterial soap and water. May shower; gently cleanse wound with antibacterial soap, rinse and pat dry prior to dressing wounds Prevette, [name] ([id number]) [id number] Page 5 of 10 No tub bath. Off-Loading Open toe surgical shoe Wound Treatment Wound #2 - Foot Wound Laterality: Right, Circumferential Cleanser: Normal Saline 1 x Per Day/30 Days Discharge Instructions: Wash your hands with soap and water. Remove old dressing, discard into plastic bag and place into trash. Cleanse the wound with Normal Saline prior to applying a clean dressing using gauze sponges, not tissues or cotton balls. Do not scrub or use excessive force. Pat dry using gauze sponges, not tissue or cotton balls. Cleanser: Soap and Water 1 x Per Day/30 Days Discharge Instructions: Gently cleanse wound with antibacterial soap, rinse and pat dry prior to dressing wounds Topical: Betadine 1 x Per Day/30 Days Discharge Instructions: apply  gauze soaked dressing to top of foot and toes Prim Dressing: Gauze 1 x Per Day/30 Days ary Discharge Instructions: As directed: betadine applied Prim Dressing: Prisma 4.34 (in) 1 x Per Day/30 Days ary Discharge Instructions: Moisten w/normal saline or sterile water; Cover open areas of wound as directed Do not remove from wound bed. Secondary Dressing: ABD Pad 5x9 (in/in) 1 x Per Day/30 Days Discharge Instructions: Cover with ABD pad Secured With: Medipore T - 41M Medipore H Soft Cloth Surgical T ape ape, 2x2 (in/yd) 1 x Per Day/30 Days Secured With: Kerlix Roll Sterile or Non-Sterile 6-ply 4.5x4 (yd/yd) 1 x Per Day/30 Days Discharge Instructions: Apply Kerlix as directed Secured With: Stretch Net Dressing, Latex-free, Size 5, Small-Head / Shoulder / Thigh 1 x Per Day/30  Days Electronic Signature(s) Signed: 03/15/2022 9:50:53 AM By: Massie Kluver Signed: 03/15/2022 2:37:27 PM By: Worthy Keeler PA-C Entered By: Massie Kluver on 03/15/2022 09:19:27 -------------------------------------------------------------------------------- Problem List Details Patient Name: Date of Service: [place], [place] [name]. 03/15/2022 8:30 A [name] Medical Record Number: [id number] Patient Account Number: [id number] Date of Birth/Sex: Treating RN: November 06, 1970 (52 y.o. [name] Primary Care Provider: Evern Bio Other Clinician: Massie Kluver Referring Provider: Treating Provider/Extender: Darien Ramus Weeks in Treatment: 9 Active Problems ICD-10 Encounter Code Description Active Date MDM Diagnosis M86.371 Chronic multifocal osteomyelitis, right ankle and foot 05/18/2021 No Yes E11.621 Type 2 diabetes mellitus with foot ulcer 05/18/2021 No Yes Boyar, [name] ([id number]) [id number] Page 6 of 10 L97.514 Non-pressure chronic ulcer of other part of right foot with necrosis of bone 05/18/2021 No Yes I50.42 Chronic combined systolic (congestive) and diastolic (congestive) heart failure 05/18/2021 No Yes I10 Essential (primary) hypertension 05/18/2021 No Yes N18.6 End stage renal disease 05/18/2021 No Yes Z99.2 Dependence on renal dialysis 05/18/2021 No Yes Z99.81 Dependence on supplemental oxygen 05/18/2021 No Yes Inactive Problems Resolved Problems Electronic Signature(s) Signed: 03/15/2022 8:31:47 AM By: Worthy Keeler PA-C Entered By: Worthy Keeler on 03/15/2022 08:31:47 -------------------------------------------------------------------------------- Progress Note Details Patient Name: Date of Service: [place] [name]. 03/15/2022 8:30 A [name] Medical Record Number: [id number] Patient Account Number: [id number] Date of Birth/Sex: Treating RN: [birth year] (52 y.o. [name] Primary Care Provider: Evern Bio Other Clinician:  Massie Kluver Referring Provider: Treating Provider/Extender: Darien Ramus Weeks in Treatment: 9 Subjective Chief Complaint Information obtained from Patient Right foot gangrene and osteomyelitis History of Present Illness (HPI) 10/04/2020 upon evaluation today patient presents for initial inspection here in the clinic concerning a fairly concerning wound over her right fifth toe towards the medial aspect which actually probes all the way down to bone. There is evidence of necrotic muscle as well and I do see tendon exposed as well. Subsequently this does have me very concerned about where things stand here. Again this is the first time that I am seeing her and on top of everything else her ABIs are noncompressible. I think that arterial flow is the primary concern here. Of note the patient states this was first noted July 1. She had a x-ray on July 4 which was negative for bone infection. She has been on 2 antibiotics she is unsure of the name. With that being said her most recent hemoglobin A1c was 8.1 that was December 2022. She otherwise does have a history obviously of diabetes mellitus type 2, hypertension, end-stage renal disease with dependence on renal dialysis, and she is also dependent on supplemental oxygen. Readmission: 05/14/2021 this is a patient whom I previously  seen October 04, 2020. With that being said she is subsequently on 21 October 2020 ended up having an amputation of her fifth digit of her foot unfortunately. This had progressed fairly quickly into what sounds to been gangrene at that time. Subsequently right Stemmer, [name] ([id number]) [id number] Page 7 of 10 now based on what I am seeing the patient actually has a necrotic area on the end of her foot and has been recommended that the only option really here is going to be a amputation which would be a below-knee amputation. She is really not interested in that and wants to  try to give this every chance it can to heal. For that reason I discussed with her that definitely we can see what we can do to try to improve this situation and try to help get this doing better as best we can. With that being said there is definitely a portion of her wound that is just not good to be able to heal no matter what it is already starting to auto amputate. This includes potentially digits of her foot as well based on what I am seeing. She does have confirmed osteomyelitis noted by MRI. She is also currently on IV vancomycin. She does undergo dialysis on Tuesday, Thursday, and Saturday. The patient does have a history of diabetes mellitus type 2, congestive heart failure, hypertension, end-stage renal disease, dependence on renal dialysis, and dependence on supplemental oxygen. With that being said I am going to need to look into her heart failure and the significance of this as far as hyperbarics is concerned before we initiate treatment. She is probably also going to need an updated EKG and chest x-ray unless its already been done recently. 05/28/2021 upon evaluation today patient appears to be doing well with regard to her wound all things considered. I am going to try to see what we can do about trimmed away some of the necrotic tissue that is really just hanging on here and there and not really helpful. Feel like if we can slowly start doing this maybe we will be able to get to the point where this is better and better as far as cleaning up the surface of the wound is concerned. Still as I explained I do believe some of her toes are not salvageable but again she is looking more towards autoamputation versus want to do anything definitive here from a surgical standpoint. I have been researching whether she would be an okay candidate for hyperbarics or not and I found that her x-ray but she had a chest x-ray recently was negative for any acute disease and appear to be stable. EKG was also  stable. She did also have a cardiac echo which showed that her ejection fraction was 60 to 65% and therefore I think she is a candidate for hyperbarics. Again I do believe that this can help with preventing amputation which right now she has been told that her only option would be a below-knee amputation. Nonetheless I think that if we can get the heel leading to spread and the majority of this cleared away that it is possible we might even be able to get a surgeon just to surgically remove the nonviable toes and not have to go the full way of complete amputation. Nonetheless we do have a ways to go before we will get to that point. Either way I think the hyperbarics could be an excellent support as far as that is concerned. 06-05-2021 upon  evaluation today patient's wound is showing some signs of improvement. This does seem to be slow and of course it is understandable considering what we are seeing. She has discussed and looked up information about hyperbarics and in the end comes back today and states that she does want to proceed. I honestly do feel like that is her best chance at saving the foot. Even if we are able to get her into a surgeon after we obtain some good healing approximately so that there is not as much necrotic tissue in general she may even be able to have a transmetatarsal amputation and get this to heal. Again I am not saying this is can be an easy road but it may be her best road as far as thinking about how to achieve some type of complete healing as I do not believe the toes are viable at all to be honest. 07-06-2021 upon evaluation today patient appears to be doing well currently in regard to her foot all things considered. Again she unfortunately was unable to do the hyperbaric oxygen therapy this was due to her claustrophobia. Nonetheless she tells me that she is pleased with how the foot appears and she does feel like there is good improvement going on here. In general I  think that we are on the right track. If she is going to take a very long time and to be honest the route of autoamputation which is pretty much the way this is going is not a quick or easy road but nonetheless she seems to be taking care of her foot quite well. She is in good spirits. 07-27-2021 upon evaluation today patient appears to be doing well currently in regard to her foot all things considered. A lot of the eschar is starting to lift up around the edges which is good there is still significant necrotic tissue on the distal portion of the foot. This is slowly going to work its way off. Based on what I am seeing currently I do not believe that there is any evidence of infection which is good news. 08-31-2021 upon evaluation today patient's wound actually appears to be doing decently well. I do not see anything in particular to try to trim away today. With that being said she continues to slowly allow this to do what is going to do as far as autoamputation is concerned. I did have another conversation with her today about the possibility of seeing a surgeon for a transmetatarsal amputation which again would help to get this to close faster and obviously I think would be a much speedy year and safer way to proceed with amputation. With that being said she tells me that she would really prefer to just let this go at "God pace". 09-28-2021 upon evaluation today patient's foot actually appears to be doing much better. Fortunately I do not see any signs of infection which is great news and overall I am extremely pleased with where we stand today. There does not appear to be any signs of active infection locally or systemically which is great news. No fevers, chills, nausea, vomiting, or diarrhea. 10-26-2021 upon evaluation today patient appears to be doing well currently in regard to her foot all things considered. Fortunately I do not see any evidence of active infection locally or systemically at this  time which is great news. No fevers, chills, nausea, vomiting, or diarrhea. She does have some of the eschar that is lifting up even a little bit more compared  to last time I saw her. 11-30-2021 upon evaluation today patient's wound is still continuing to have some areas that lift up although there is nothing really that I feel like I could trim away too much today to be honest. Fortunately I do not see any evidence of systemic infection though locally she has been concerned about some drainage I do not see anything that seems to be actively red or erythematous or warm to touch which is good news. No fevers, chills, nausea, vomiting, or diarrhea. 12-21-2021 upon evaluation patient's foot ulcer actually is showing signs of some improvement with healthier tissue noted at areas with eschar that keeps clearing off. There is a bit of the eschar that is loosening up today as well and I think we will get a be able to get some of this cleared away that we have been working on for a while. Fortunately I do not see any evidence of infection locally or systemically at this time which is great news. No fevers, chills, nausea, vomiting, or diarrhea. 01-11-2022 upon evaluation today patient appears to be doing decently well in regard to her foot she still has a necrotic area on the tip of her foot but she is tolerating the dressing changes without complication. Fortunately there does not appear to be any signs of infection locally or systemically at this time which is great news. No fevers, chills, nausea, vomiting, or diarrhea. 02-01-2022 upon evaluation today patient continues to show signs of improvement in regard to the area where we have been able to remove the eschar. Unfortunately the tip of the foot I think is the part that is completely dead and not going to be coming back as far as any healing is concerned. I have discussed this with the patient multiple times however she does not want to have any type of  surgical intervention. Obviously in that regard we will try to do what we can as best we can. 02-15-2022 upon evaluation today patient appears to be doing well with regard to her foot. She is actually showing some signs of improvement she has had some drainage but nothing appears to be doing too poorly overall. I do not see any signs of infection and I am pleased in that regard. Fortunately I do not see any evidence of infection locally nor systemically which is great news. 03-01-2022 upon evaluation today patient appears to be doing well currently in regard to her wounds. She has been tolerating the dressing changes without complication. Fortunately I do not see any signs of active infection locally nor systemically which is great news. No fevers, chills, nausea, vomiting, or diarrhea. 03-15-2022 upon evaluation today patient's wound continues to show signs of loosening the splint some of the eschar is concerned on the tip of her foot. Fortunately I do not see any evidence of infection systemically which is good news locally I feel like she is making good progress here. Overall I am extremely happy with where things stand. Especially considering the fact that really she does need to have an amputation of the necrotic portion of the end of her foot. Again if we got rid of those toes I do think that this could actually heal much more effectively and quickly with that being said she is completely against going down that road so we will try to do what we can as conservatively as we can while still making some progress here. Markgraf, [name] ([id number]) [id number] Page 8 of 10 Objective Constitutional Well-nourished and  well-hydrated in no acute distress. Vitals Time Taken: 8:38 AM, Temperature: 98.5 F, Pulse: 114 bpm, Respiratory Rate: 18 breaths/min, Blood Pressure: 118/74 mmHg. Respiratory normal breathing without difficulty. Psychiatric this patient is able to make  decisions and demonstrates good insight into disease process. Alert and Oriented x 3. pleasant and cooperative. General Notes: Upon inspection patient's wound bed actually showed signs again of some eschar that was able to remove towards the distal portion of her foot although not all of this. And underneath there was some exposed tendon although this has been the case along the bottom of the foot altogether she is slowly seeing signs of improvement as far as granulation tissue coming and is concerned and covered over these areas. We have been using collagen over this spot. Integumentary (Hair, Skin) Wound #2 status is Open. Original cause of wound was Gradually Appeared. The date acquired was: 10/15/2020. The wound has been in treatment 43 weeks. The wound is located on the Right,Circumferential Foot. The wound measures 13.7cm length x 8cm width x 0.2cm depth; 86.08cm^2 area and 17.216cm^3 volume. There is Fat Layer (Subcutaneous Tissue) exposed. There is a medium amount of serosanguineous drainage noted. There is medium (34-66%) granulation within the wound bed. There is a medium (34-66%) amount of necrotic tissue within the wound bed including Eschar and Necrosis of Bone. Assessment Active Problems ICD-10 Chronic multifocal osteomyelitis, right ankle and foot Type 2 diabetes mellitus with foot ulcer Non-pressure chronic ulcer of other part of right foot with necrosis of bone Chronic combined systolic (congestive) and diastolic (congestive) heart failure Essential (primary) hypertension End stage renal disease Dependence on renal dialysis Dependence on supplemental oxygen Procedures Wound #2 Pre-procedure diagnosis of Wound #2 is a Diabetic Wound/Ulcer of the Lower Extremity located on the Right,Circumferential Foot .Severity of Tissue Pre Debridement is: Necrosis of muscle. There was a Excisional Skin/Subcutaneous Tissue/Muscle Debridement with a total area of 9 sq cm performed by  Tommie Sams., PA-C. With the following instrument(s): Curette to remove Viable and Non-Viable tissue/material. Material removed includes Eschar, Tendon, Subcutaneous Tissue, and Slough. A time out was conducted at 09:10, prior to the start of the procedure. A Minimum amount of bleeding was controlled with Pressure. The procedure was tolerated well. Post Debridement Measurements: 13.7cm length x 8cm width x 0.2cm depth; 17.216cm^3 volume. Character of Wound/Ulcer Post Debridement is stable. Severity of Tissue Post Debridement is: Necrosis of muscle. Post procedure Diagnosis Wound #2: Same as Pre-Procedure Plan Follow-up Appointments: Return Appointment in 3 weeks. Nurse Visit as needed Home Health: [name] for wound care. May utilize formulary equivalent dressing for wound treatment orders unless otherwise specified. Home Health Nurse may visit PRN to address patientoos wound care needs. Scheduled days for dressing changes to be completed; exception, patient has scheduled wound care visit that day. **Please direct any NON-WOUND related issues/requests for orders to patient's Primary Care Physician. **If current dressing causes regression in wound condition, may D/C ordered dressing product/s and apply Normal Saline Moist Dressing daily until next [place] or Other MD appointment. **Notify [facility] of regression in wound condition at [phone]. Bathing/ Shower/ Hygiene: Clean wound with Normal Saline or wound cleanser. Wash wounds with antibacterial soap and water. May shower; gently cleanse wound with antibacterial soap, rinse and pat dry prior to dressing wounds No tub bath. Off-Loading: Open toe surgical shoe WOUND #2: - Foot Wound Laterality: Right, Circumferential Cleanser: Normal Saline 1 x Per Day/30 Days Olkowski, [name] ([id number]) [id number] Page 9 of 10 Discharge Instructions:  Wash your hands with soap and water. Remove old dressing, discard into plastic bag and place into trash. Cleanse the wound with Normal Saline prior to applying a clean dressing using gauze sponges, not tissues or cotton balls. Do not scrub or use excessive force. Pat dry using gauze sponges, not tissue or cotton balls. Cleanser: Soap and Water 1 x Per Day/30 Days Discharge Instructions: Gently cleanse wound with antibacterial soap, rinse and pat dry prior to dressing wounds Topical: Betadine 1 x Per Day/30 Days Discharge Instructions: apply gauze soaked dressing to top of foot and toes Prim Dressing: Gauze 1 x Per Day/30 Days ary Discharge Instructions: As directed: betadine applied Prim Dressing: Prisma 4.34 (in) 1 x Per Day/30 Days ary Discharge Instructions: Moisten w/normal saline or sterile water; Cover open areas of wound as directed Do not remove from wound bed. Secondary Dressing: ABD Pad 5x9 (in/in) 1 x Per Day/30 Days Discharge Instructions: Cover with ABD pad Secured With: Medipore T - 28M Medipore H Soft Cloth Surgical T ape ape, 2x2 (in/yd) 1 x Per Day/30 Days Secured With: [employer] Sterile or Non-Sterile 6-ply 4.5x4 (yd/yd) 1 x Per Day/30 Days Discharge Instructions: Apply Kerlix as directed Secured With: [employer] Dressing, Latex-free, Size 5, Small-Head / Shoulder / Thigh 1 x Per Day/30 Days 1. I would recommend currently that we have the patient continue with the collagen to the open part of the wound at this time which I think is doing a good job here. 2. I am also can recommend that we have the patient continue to monitor for any signs of infection or worsening. Obviously if anything changes she knows to contact the office let me know. 3. I am also going to recommend that the patient should be using Betadine to the tip of her foot which I think is actually doing well all things considered. It is necrotic and gangrenous but again is dry and thus we will try to keep at  this point. We will see patient back for reevaluation in 1 week here in the clinic. If anything worsens or changes patient will contact our office for additional recommendations. Electronic Signature(s) Signed: 03/15/2022 10:45:49 AM By: Worthy Keeler PA-C Entered By: Worthy Keeler on 03/15/2022 10:45:49 -------------------------------------------------------------------------------- SuperBill Details Patient Name: Date of Service: [place], [place] [name]. 03/15/2022 Medical Record Number: [id number] Patient Account Number: [id number] Date of Birth/Sex: Treating RN: [birth year] (52 y.o. [name] Primary Care Provider: Evern Bio Other Clinician: Massie Kluver Referring Provider: Treating Provider/Extender: Darien Ramus Weeks in Treatment: 31 Diagnosis Coding ICD-10 Codes Code Description M86.371 Chronic multifocal osteomyelitis, right ankle and foot E11.621 Type 2 diabetes mellitus with foot ulcer L97.514 Non-pressure chronic ulcer of other part of right foot with necrosis of bone I50.42 Chronic combined systolic (congestive) and diastolic (congestive) heart failure I10 Essential (primary) hypertension N18.6 End stage renal disease Z99.2 Dependence on renal dialysis Z99.81 Dependence on supplemental oxygen Facility Procedures : 3 Giannattasio, CPT4 Code: [id number] Jeanetta [name] ([id number]) Description: 11043 - DEB MUSC/FASCIA 20 SQ CM/< ICD-10 Diagnosis Description L97.514 Non-pressure chronic ulcer of other part of right foot with necrosis of bone [phone] Modifier: [id number] Page Quantity: 1 10 of 10 Physician Procedures : CPT4 Code Description Modifier [id number] - WC PHYS DEBR MUSCLE/FASCIA 20 SQ CM ICD-10 Diagnosis Description L97.514 Non-pressure chronic ulcer of other part of right foot with necrosis of bone Quantity: 1 Electronic Signature(s) Signed: 03/15/2022 10:49:07  AM By: Worthy Keeler PA-C Entered By: Worthy Keeler on 03/15/2022  10:49:07

## 2022-03-15 NOTE — Progress Notes (Addendum)
Hayden, [name] ([id number]) [id number] Page 1 of 10 Visit Report for 03/15/2022 Arrival Information Details Patient Name: Date of Service: Date, [name] 03/15/2022 8:30 A M Medical Record Number: [id number] Patient Account Number: [id number] Date of Birth/Sex: Treating RN: Apr 19, 1970 (52 y.o. [name] Primary Care Jolynne Spurgin: Evern Bio Other Clinician: Massie Kluver Referring Sargent Mankey: Treating Catriona Dillenbeck/Extender: Darien Ramus Weeks in Treatment: 66 Visit Information History Since Last Visit All ordered tests and consults were completed: No Patient Arrived: Wheel Chair Added or deleted any medications: No Arrival Time: 08:36 Any new allergies or adverse reactions: No Transfer Assistance: EasyPivot Patient Lift Had a fall or experienced change in No Patient Identification Verified: Yes activities of daily living that may affect Secondary Verification Process Completed: Yes risk of falls: Patient Requires Transmission-Based Precautions: No Signs or symptoms of abuse/neglect since last visito No Patient Has Alerts: No Hospitalized since last visit: No Implantable device outside of the clinic excluding No cellular tissue based products placed in the center since last visit: Has Dressing in Place as Prescribed: Yes Pain Present Now: Yes Electronic Signature(s) Signed: 03/15/2022 9:50:53 AM By: Massie Kluver Entered By: Massie Kluver on 03/15/2022 08:37:38 -------------------------------------------------------------------------------- Clinic Level of Care Assessment Details Patient Name: Date of Service: [place], [name] 03/15/2022 8:30 A M Medical Record Number: [id number] Patient Account Number: [id number] Date of Birth/Sex: Treating RN: [birth year] (52 y.o. [name] Primary Care Shaquavia Whisonant: Evern Bio Other Clinician: Massie Kluver Referring [name]: Treating Omarian Jaquith/Extender: Darien Ramus Weeks in Treatment: 63 Clinic Level of Care Assessment Items TOOL 1 Quantity Score '[]'$  - 0 Use when EandM and Procedure is performed on INITIAL visit ASSESSMENTS - Nursing Assessment / Reassessment '[]'$  - 0 General Physical Exam (combine w/ comprehensive assessment (listed just below) when performed on new pt. evals) '[]'$  - 0 Comprehensive Assessment (HX, ROS, Risk Assessments, Wounds Hx, etc.) Hassan, [name] ([id number]) [id number] Page 2 of 10 ASSESSMENTS - Wound and Skin Assessment / Reassessment '[]'$  - 0 Dermatologic / Skin Assessment (not related to wound area) ASSESSMENTS - Ostomy and/or Continence Assessment and Care '[]'$  - 0 Incontinence Assessment and Management '[]'$  - 0 Ostomy Care Assessment and Management (repouching, etc.) PROCESS - Coordination of Care '[]'$  - 0 Simple Patient / Family Education for ongoing care '[]'$  - 0 Complex (extensive) Patient / Family Education for ongoing care '[]'$  - 0 Staff obtains Programmer, systems, Records, T Results / Process Orders est '[]'$  - 0 Staff telephones HHA, Nursing Homes / Clarify orders / etc '[]'$  - 0 Routine Transfer to another Facility (non-emergent condition) '[]'$  - 0 Routine Hospital Admission (non-emergent condition) '[]'$  - 0 New Admissions / Biomedical engineer / Ordering NPWT Apligraf, etc. , '[]'$  - 0 Emergency Hospital Admission (emergent condition) PROCESS - Special Needs '[]'$  - 0 Pediatric / Minor Patient Management '[]'$  - 0 Isolation Patient Management '[]'$  - 0 Hearing / Language / Visual special needs '[]'$  - 0 Assessment of Community assistance (transportation, D/C planning, etc.) '[]'$  - 0 Additional assistance / Altered mentation '[]'$  - 0 Support Surface(s) Assessment (bed, cushion, seat, etc.) INTERVENTIONS - Miscellaneous '[]'$  - 0 External ear exam '[]'$  - 0 Patient Transfer (multiple staff / Civil Service fast streamer / Similar devices) '[]'$  - 0 Simple Staple / Suture removal (25 or less) '[]'$  - 0 Complex Staple / Suture  removal (26 or more) '[]'$  - 0 Hypo/Hyperglycemic Management (do not check if billed separately) '[]'$  - 0 Ankle / Brachial Index (ABI) - do not check if billed  separately Has the patient been seen at the hospital within the last three years: Yes Total Score: 0 Level Of Care: ____ Electronic Signature(s) Signed: 03/15/2022 9:50:53 AM By: Massie Kluver Entered By: Massie Kluver on 03/15/2022 09:19:33 -------------------------------------------------------------------------------- Encounter Discharge Information Details Patient Name: Date of Service: [place], [name] 03/15/2022 8:30 A M Medical Record Number: [id number] Patient Account Number: [id number] Date of Birth/Sex: Treating RN: Jul 01, 1970 (52 y.o. [name] Primary Care Emmalee Solivan: Evern Bio Other Clinician: Massie Kluver Referring Miangel Flom: Treating Veroncia Jezek/Extender: Darien Ramus Lundy, [place] ([id number]) [id number] Page 3 of 10 Weeks in Treatment: 28 Encounter Discharge Information Items Post Procedure Vitals Discharge Condition: Stable Temperature (F): 98.5 Ambulatory Status: Wheelchair Pulse (bpm): 114 Discharge Destination: Home Respiratory Rate (breaths/min): 18 Transportation: Private Auto Blood Pressure (mmHg): 118/74 Accompanied By: husband Schedule Follow-up Appointment: Yes Clinical Summary of Care: Electronic Signature(s) Signed: 03/15/2022 9:50:53 AM By: Massie Kluver Entered By: Massie Kluver on 03/15/2022 09:32:32 -------------------------------------------------------------------------------- Lower Extremity Assessment Details Patient Name: Date of Service: [place], [name] 03/15/2022 8:30 A M Medical Record Number: [id number] Patient Account Number: [id number] Date of Birth/Sex: Treating RN: 1970-12-23 (52 y.o. [name] Primary Care Abrahm Mancia: Evern Bio Other Clinician: Massie Kluver Referring Kaiyana Bedore: Treating Liddy Deam/Extender: Darien Ramus Weeks in Treatment: 29 Electronic Signature(s) Signed: 03/15/2022 9:50:53 AM By: Massie Kluver Signed: 03/15/2022 1:44:04 PM By: Gretta Cool, BSN, RN, CWS, Kim RN, BSN Entered By: Massie Kluver on 03/15/2022 08:49:43 -------------------------------------------------------------------------------- Multi Wound Chart Details Patient Name: Date of Service: [place], [name] 03/15/2022 8:30 A M Medical Record Number: [id number] Patient Account Number: [id number] Date of Birth/Sex: Treating RN: 08-15-1970 (52 y.o. [name] Primary Care Savien Mamula: Evern Bio Other Clinician: Massie Kluver Referring Torres Hardenbrook: Treating Alaiyah Bollman/Extender: Darien Ramus Weeks in Treatment: 7 Vital Signs Height(in): Pulse(bpm): 114 Weight(lbs): Blood Pressure(mmHg): 118/74 Body Mass Index(BMI): Temperature(F): 98.5 Respiratory Rate(breaths/min): [address] ([id number]):Photos:] [phone].pdf Page 4 of 10:2 N/A N/A N/A N/A] Right, Circumferential Foot N/A N/A Wound Location: Gradually Appeared N/A N/A Wounding Event: Diabetic Wound/Ulcer of the Lower N/A N/A Primary Etiology: Extremity Cataracts, Anemia, Asthma, Chronic N/A N/A Comorbid History: Obstructive Pulmonary Disease (COPD), Congestive Heart Failure, Hypertension, Type II Diabetes, End Stage Renal Disease, Osteoarthritis, Neuropathy 10/15/2020 N/A N/A Date Acquired: 61 N/A N/A Weeks of Treatment: Open N/A N/A Wound Status: No N/A N/A Wound Recurrence: Yes N/A N/A Pending A mputation on Presentation: 8x8x0.2 N/A N/A Measurements L x W x D (cm) 50.265 N/A N/A A (cm) : rea 10.053 N/A N/A Volume (cm) : 52.60% N/A N/A % Reduction in A rea: 95.30% N/A N/A % Reduction in Volume: Grade 4 N/A N/A Classification: Medium N/A N/A Exudate A mount: Serosanguineous N/A N/A Exudate Type: red, brown N/A N/A Exudate Color: Medium (34-66%) N/A N/A Granulation A  mount: Medium (34-66%) N/A N/A Necrotic A mount: Eschar, Necrosis of Bone N/A N/A Necrotic Tissue: Fat Layer (Subcutaneous Tissue): Yes N/A N/A Exposed Structures: Fascia: No Tendon: No Muscle: No Joint: No Bone: No Treatment Notes Electronic Signature(s) Signed: 03/15/2022 9:50:53 AM By: Massie Kluver Entered By: Massie Kluver on 03/15/2022 08:50:12 -------------------------------------------------------------------------------- Multi-Disciplinary Care Plan Details Patient Name: Date of Service: [place], [name] 03/15/2022 8:30 A M Medical Record Number: [id number] Patient Account Number: [id number] MONEKA, MCQUINN ([id number]) [id number] Page 5 of 10 Date of Birth/Sex: Treating RN: October 23, 1970 (52 y.o. [name] Primary Care Arvid Marengo: Other Clinician: Robyn Haber Referring Jahki Witham: Treating Deveon Kisiel/Extender: Darien Ramus  Weeks in Treatment: 43 Active Inactive HBO Nursing Diagnoses: Anxiety related to feelings of confinement associated with the hyperbaric oxygen chamber Anxiety related to knowledge deficit of hyperbaric oxygen therapy and treatment procedures Discomfort related to temperature and humidity changes inside hyperbaric chamber Potential for barotraumas to ears, sinuses, teeth, and lungs or cerebral gas embolism related to changes in atmospheric pressure inside hyperbaric oxygen chamber Potential for oxygen toxicity seizures related to delivery of 100% oxygen at an increased atmospheric pressure Potential for pulmonary oxygen toxicity related to delivery of 100% oxygen at an increased atmospheric pressure Goals: Barotrauma will be prevented during HBO2 Date Initiated: 06/05/2021 T arget Resolution Date: 06/05/2021 Goal Status: Active Patient and/or family will be able to state/discuss factors appropriate to the management of their disease process during treatment Date Initiated: 06/05/2021 T arget  Resolution Date: 06/05/2021 Goal Status: Active Patient will tolerate the hyperbaric oxygen therapy treatment Date Initiated: 06/05/2021 T arget Resolution Date: 06/05/2021 Goal Status: Active Patient will tolerate the internal climate of the chamber Date Initiated: 06/05/2021 T arget Resolution Date: 06/05/2021 Goal Status: Active Patient/caregiver will verbalize understanding of HBO goals, rationale, procedures and potential hazards Date Initiated: 06/05/2021 T arget Resolution Date: 06/05/2021 Goal Status: Active Signs and symptoms of pulmonary oxygen toxicity will be recognized and promptly addressed Date Initiated: 06/05/2021 T arget Resolution Date: 06/05/2021 Goal Status: Active Signs and symptoms of seizure will be recognized and promptly addressed ; seizing patients will suffer no harm Date Initiated: 06/05/2021 T arget Resolution Date: 06/05/2021 Goal Status: Active Interventions: Administer a five (5) minute air break for patient if signs and symptoms of seizure appear and notify the hyperbaric physician Administer decongestants, per physician orders, prior to HBO2 Administer the correct therapeutic gas delivery based on the patients needs and limitations, per physician order Assess and provide for patients comfort related to the hyperbaric environment and equalization of middle ear Assess for signs and symptoms related to adverse events, including but not limited to confinement anxiety, pneumothorax, oxygen toxicity and baurotrauma Assess patient for any history of confinement anxiety Assess patient's knowledge and expectations regarding hyperbaric medicine and provide education related to the hyperbaric environment, goals of treatment and prevention of adverse events Implement protocols to decrease risk of pneumothorax in high risk patients Notes: Osteomyelitis Nursing Diagnoses: Infection: osteomyelitis Knowledge deficit related to disease process and management Potential for  infection: osteomyelitis Goals: Diagnostic evaluation for osteomyelitis completed as ordered Date Initiated: 06/05/2021 Target Resolution Date: 06/05/2021 Goal Status: Active Patient/caregiver will verbalize understanding of disease process and disease management Date Initiated: 06/05/2021 Target Resolution Date: 06/05/2021 Goal Status: Active [name] osteomyelitis will resolve Date Initiated: 06/05/2021 Target Resolution Date: 06/05/2021 Goal Status: Active Signs and symptoms for osteomyelitis will be recognized and promptly addressed Date Initiated: 06/05/2021 Target Resolution Date: 06/05/2021 LYNELLE, WEILER ([id number]) [id number] Page 6 of 10 Goal Status: Active Interventions: Assess for signs and symptoms of osteomyelitis resolution every visit Provide education on osteomyelitis Notes: Wound/Skin Impairment Nursing Diagnoses: Impaired tissue integrity Knowledge deficit related to ulceration/compromised skin integrity Goals: Ulcer/skin breakdown will have a volume reduction of 30% by week 4 Date Initiated: 05/18/2021 Target Resolution Date: 06/15/2021 Goal Status: Active Ulcer/skin breakdown will have a volume reduction of 50% by week 8 Date Initiated: 05/18/2021 Target Resolution Date: 07/13/2021 Goal Status: Active Ulcer/skin breakdown will have a volume reduction of 80% by week 12 Date Initiated: 05/18/2021 Target Resolution Date: 08/10/2021 Goal Status: Active Ulcer/skin breakdown will heal within 14 weeks Date Initiated: 05/18/2021 Target Resolution Date: 08/24/2021  Goal Status: Active Interventions: Assess patient/caregiver ability to obtain necessary supplies Assess patient/caregiver ability to perform ulcer/skin care regimen upon admission and as needed Assess ulceration(s) every visit Provide education on ulcer and skin care Notes: Electronic Signature(s) Signed: 03/15/2022 9:50:53 AM By: Massie Kluver Signed: 03/15/2022 1:44:04 PM By:  Gretta Cool, BSN, RN, CWS, Kim RN, BSN Entered By: Massie Kluver on 03/15/2022 09:31:37 -------------------------------------------------------------------------------- Pain Assessment Details Patient Name: Date of Service: [place], [name] 03/15/2022 8:30 A M Medical Record Number: [id number] Patient Account Number: [id number] Date of Birth/Sex: Treating RN: 06/06/1970 (52 y.o. [name] Primary Care Io Dieujuste: Evern Bio Other Clinician: Massie Kluver Referring Ajiah Mcglinn: Treating Rayann Jolley/Extender: Darien Ramus Weeks in Treatment: 25 Active Problems Location of Pain Severity and Description of Pain Patient Has Paino Yes Site Locations Pain Location: Hinderer, [name] ([id number]) [id number] Page 7 of 10 Pain Location: Pain in Ulcers Duration of the Pain. Constant / Intermittento Intermittent Rate the pain. Current Pain Level: 3 Character of Pain Describe the Pain: Aching, Shooting Pain Management and Medication Current Pain Management: Medication: Yes Cold Application: No Rest: No Massage: No Activity: No T.E.N.S.: No Heat Application: No Leg drop or elevation: No Is the Current Pain Management Adequate: Inadequate How does your wound impact your activities of daily livingo Sleep: No Bathing: No Appetite: No Relationship With Others: No Bladder Continence: No Emotions: No Bowel Continence: No Work: No Toileting: No Drive: No Dressing: No Hobbies: No Electronic Signature(s) Signed: 03/15/2022 9:50:53 AM By: Massie Kluver Signed: 03/15/2022 1:44:04 PM By: Gretta Cool, BSN, RN, CWS, Kim RN, BSN Entered By: Massie Kluver on 03/15/2022 08:40:29 -------------------------------------------------------------------------------- Patient/Caregiver Education Details Patient Name: Date of Service: [place], [name] 1/19/2024andnbsp8:30 A M Medical Record Number: [id number] Patient Account Number: [id number] Date of Birth/Gender:  Treating RN: 1971-01-18 (52 y.o. [name] Primary Care Physician: Evern Bio Other Clinician: Massie Kluver Referring Physician: Treating Physician/Extender: Darien Ramus Weeks in Treatment: 51 Education Assessment Education Provided To: Patient Education Topics Provided Wound/Skin Impairment: Handouts: Other: continue wound care as directed [place], [name] ([id number]) [id number] Page 8 of 10 Methods: Explain/Verbal Responses: State content correctly Electronic Signature(s) Signed: 03/15/2022 9:50:53 AM By: Massie Kluver Entered By: Massie Kluver on 03/15/2022 09:31:32 -------------------------------------------------------------------------------- Wound Assessment Details Patient Name: Date of Service: [place], [name] 03/15/2022 8:30 A M Medical Record Number: [id number] Patient Account Number: [id number] Date of Birth/Sex: Treating RN: 10/29/1970 (52 y.o. [name] Primary Care Gwenn Teodoro: Evern Bio Other Clinician: Massie Kluver Referring Aisa Schoeppner: Treating Delmy Holdren/Extender: Darien Ramus Weeks in Treatment: 80 Wound Status Wound Number: 2 Primary Diabetic Wound/Ulcer of the Lower Extremity Etiology: Wound Location: Right, Circumferential Foot Wound Open Wounding Event: Gradually Appeared Status: Date Acquired: 10/15/2020 Notes: open areas to midline right foot, lateral right foot, and top of right Weeks Of Treatment: 43 foot Clustered Wound: No Comorbid Cataracts, Anemia, Asthma, Chronic Obstructive Pulmonary Pending Amputation On Presentation History: Disease (COPD), Congestive Heart Failure, Hypertension, Type II Diabetes, End Stage Renal Disease, Osteoarthritis, Neuropathy Photos Wound Measurements Length: (cm) 13.7 Width: (cm) 8 Depth: (cm) 0.2 Area: (cm) 86.08 Volume: (cm) 17.216 % Reduction in Area: 18.8% % Reduction in Volume: 91.9% Wound Description Classification: Grade  4 Exudate Amount: Medium Exudate Type: Serosanguineous Exudate Color: red, brown Foul Odor After Cleansing: No Slough/Fibrino Yes Wound Bed Granulation Amount: Medium (34-66%) Exposed Structure Necrotic Amount: Medium (34-66%) Fascia Exposed: No Necrotic Quality: Eschar, Bone Fat Layer (Subcutaneous Tissue) Exposed: Yes Tendon Exposed: No Nigg, Maansi M (  [id number]) [id number] Page 9 of 10 Muscle Exposed: No Joint Exposed: No Bone Exposed: No Treatment Notes Wound #2 (Foot) Wound Laterality: Right, Circumferential Cleanser Normal Saline Discharge Instruction: Wash your hands with soap and water. Remove old dressing, discard into plastic bag and place into trash. Cleanse the wound with Normal Saline prior to applying a clean dressing using gauze sponges, not tissues or cotton balls. Do not scrub or use excessive force. Pat dry using gauze sponges, not tissue or cotton balls. Soap and Water Discharge Instruction: Gently cleanse wound with antibacterial soap, rinse and pat dry prior to dressing wounds Peri-Wound Care Topical Betadine Discharge Instruction: apply gauze soaked dressing to top of foot and toes Primary Dressing Gauze Discharge Instruction: As directed: betadine applied Prisma 4.34 (in) Discharge Instruction: Moisten w/normal saline or sterile water; Cover open areas of wound as directed Do not remove from wound bed. Secondary Dressing ABD Pad 5x9 (in/in) Discharge Instruction: Cover with ABD pad Secured With Medipore T - 44M Medipore H Soft Cloth Surgical T ape ape, 2x2 (in/yd) Kerlix Roll Sterile or Non-Sterile 6-ply 4.5x4 (yd/yd) Discharge Instruction: Apply Kerlix as directed Stretch Net Dressing, Latex-free, Size 5, Small-Head / Shoulder / Thigh Compression Wrap Compression Stockings Add-Ons Electronic Signature(s) Signed: 03/15/2022 9:50:53 AM By: Massie Kluver Signed: 03/15/2022 1:44:04 PM By: Gretta Cool, BSN, RN, CWS, Kim RN,  BSN Entered By: Massie Kluver on 03/15/2022 09:18:52 -------------------------------------------------------------------------------- Vitals Details Patient Name: Date of Service: [place], [name] 03/15/2022 8:30 A M Medical Record Number: [id number] Patient Account Number: [id number] Date of Birth/Sex: Treating RN: 1970/10/31 (52 y.o. [name] Primary Care Kaetlyn Noa: Evern Bio Other Clinician: Massie Kluver Referring Lonald Troiani: Treating Koltin Wehmeyer/Extender: Darien Ramus Weeks in Treatment: 21 Vital Signs Time Taken: 08:38 Temperature (F): 98.5 Pulse (bpm): 114 Brissett, Laketha M ([id number]) [phone].pdf Page 10 of 10 Respiratory Rate (breaths/min): 18 Blood Pressure (mmHg): 118/74 Reference Range: 80 - 120 mg / dl Electronic Signature(s) Signed: 03/15/2022 9:50:53 AM By: Massie Kluver Entered By: Massie Kluver on 03/15/2022 08:40:24

## 2022-03-16 DIAGNOSIS — N186 End stage renal disease: Secondary | ICD-10-CM | POA: Diagnosis not present

## 2022-03-16 DIAGNOSIS — Z992 Dependence on renal dialysis: Secondary | ICD-10-CM | POA: Diagnosis not present

## 2022-03-18 DIAGNOSIS — E1151 Type 2 diabetes mellitus with diabetic peripheral angiopathy without gangrene: Secondary | ICD-10-CM | POA: Diagnosis not present

## 2022-03-18 DIAGNOSIS — L97513 Non-pressure chronic ulcer of other part of right foot with necrosis of muscle: Secondary | ICD-10-CM | POA: Diagnosis not present

## 2022-03-18 DIAGNOSIS — E11621 Type 2 diabetes mellitus with foot ulcer: Secondary | ICD-10-CM | POA: Diagnosis not present

## 2022-03-18 DIAGNOSIS — E1169 Type 2 diabetes mellitus with other specified complication: Secondary | ICD-10-CM | POA: Diagnosis not present

## 2022-03-18 DIAGNOSIS — I5032 Chronic diastolic (congestive) heart failure: Secondary | ICD-10-CM | POA: Diagnosis not present

## 2022-03-18 DIAGNOSIS — I132 Hypertensive heart and chronic kidney disease with heart failure and with stage 5 chronic kidney disease, or end stage renal disease: Secondary | ICD-10-CM | POA: Diagnosis not present

## 2022-03-18 DIAGNOSIS — N186 End stage renal disease: Secondary | ICD-10-CM | POA: Diagnosis not present

## 2022-03-18 DIAGNOSIS — Z794 Long term (current) use of insulin: Secondary | ICD-10-CM | POA: Diagnosis not present

## 2022-03-18 DIAGNOSIS — D631 Anemia in chronic kidney disease: Secondary | ICD-10-CM | POA: Diagnosis not present

## 2022-03-18 DIAGNOSIS — Z992 Dependence on renal dialysis: Secondary | ICD-10-CM | POA: Diagnosis not present

## 2022-03-18 DIAGNOSIS — M86371 Chronic multifocal osteomyelitis, right ankle and foot: Secondary | ICD-10-CM | POA: Diagnosis not present

## 2022-03-18 DIAGNOSIS — E1122 Type 2 diabetes mellitus with diabetic chronic kidney disease: Secondary | ICD-10-CM | POA: Diagnosis not present

## 2022-03-18 DIAGNOSIS — L089 Local infection of the skin and subcutaneous tissue, unspecified: Secondary | ICD-10-CM | POA: Diagnosis not present

## 2022-03-18 DIAGNOSIS — Z9981 Dependence on supplemental oxygen: Secondary | ICD-10-CM | POA: Diagnosis not present

## 2022-03-19 DIAGNOSIS — N186 End stage renal disease: Secondary | ICD-10-CM | POA: Diagnosis not present

## 2022-03-19 DIAGNOSIS — Z992 Dependence on renal dialysis: Secondary | ICD-10-CM | POA: Diagnosis not present

## 2022-03-20 DIAGNOSIS — J449 Chronic obstructive pulmonary disease, unspecified: Secondary | ICD-10-CM | POA: Diagnosis not present

## 2022-03-20 DIAGNOSIS — J9611 Chronic respiratory failure with hypoxia: Secondary | ICD-10-CM | POA: Diagnosis not present

## 2022-03-20 DIAGNOSIS — I5032 Chronic diastolic (congestive) heart failure: Secondary | ICD-10-CM | POA: Diagnosis not present

## 2022-03-21 DIAGNOSIS — Z794 Long term (current) use of insulin: Secondary | ICD-10-CM | POA: Diagnosis not present

## 2022-03-21 DIAGNOSIS — N186 End stage renal disease: Secondary | ICD-10-CM | POA: Diagnosis not present

## 2022-03-21 DIAGNOSIS — Z992 Dependence on renal dialysis: Secondary | ICD-10-CM | POA: Diagnosis not present

## 2022-03-21 DIAGNOSIS — E119 Type 2 diabetes mellitus without complications: Secondary | ICD-10-CM | POA: Diagnosis not present

## 2022-03-22 ENCOUNTER — Encounter: Payer: Self-pay | Admitting: Pulmonary Disease

## 2022-03-22 DIAGNOSIS — Z794 Long term (current) use of insulin: Secondary | ICD-10-CM | POA: Diagnosis not present

## 2022-03-22 DIAGNOSIS — Z9981 Dependence on supplemental oxygen: Secondary | ICD-10-CM | POA: Diagnosis not present

## 2022-03-22 DIAGNOSIS — L97513 Non-pressure chronic ulcer of other part of right foot with necrosis of muscle: Secondary | ICD-10-CM | POA: Diagnosis not present

## 2022-03-22 DIAGNOSIS — L089 Local infection of the skin and subcutaneous tissue, unspecified: Secondary | ICD-10-CM | POA: Diagnosis not present

## 2022-03-22 DIAGNOSIS — D631 Anemia in chronic kidney disease: Secondary | ICD-10-CM | POA: Diagnosis not present

## 2022-03-22 DIAGNOSIS — E11621 Type 2 diabetes mellitus with foot ulcer: Secondary | ICD-10-CM | POA: Diagnosis not present

## 2022-03-22 DIAGNOSIS — E1169 Type 2 diabetes mellitus with other specified complication: Secondary | ICD-10-CM | POA: Diagnosis not present

## 2022-03-22 DIAGNOSIS — M86371 Chronic multifocal osteomyelitis, right ankle and foot: Secondary | ICD-10-CM | POA: Diagnosis not present

## 2022-03-22 DIAGNOSIS — N186 End stage renal disease: Secondary | ICD-10-CM | POA: Diagnosis not present

## 2022-03-22 DIAGNOSIS — E1122 Type 2 diabetes mellitus with diabetic chronic kidney disease: Secondary | ICD-10-CM | POA: Diagnosis not present

## 2022-03-22 DIAGNOSIS — Z992 Dependence on renal dialysis: Secondary | ICD-10-CM | POA: Diagnosis not present

## 2022-03-22 DIAGNOSIS — I132 Hypertensive heart and chronic kidney disease with heart failure and with stage 5 chronic kidney disease, or end stage renal disease: Secondary | ICD-10-CM | POA: Diagnosis not present

## 2022-03-22 DIAGNOSIS — I5032 Chronic diastolic (congestive) heart failure: Secondary | ICD-10-CM | POA: Diagnosis not present

## 2022-03-22 DIAGNOSIS — E1151 Type 2 diabetes mellitus with diabetic peripheral angiopathy without gangrene: Secondary | ICD-10-CM | POA: Diagnosis not present

## 2022-03-22 NOTE — Telephone Encounter (Signed)
I spoke with Ashly and Melissa with [employer] [employer] just shipped the mask to [facility] yesterday and they will send to the patient

## 2022-03-23 DIAGNOSIS — G4733 Obstructive sleep apnea (adult) (pediatric): Secondary | ICD-10-CM | POA: Diagnosis not present

## 2022-03-23 DIAGNOSIS — N186 End stage renal disease: Secondary | ICD-10-CM | POA: Diagnosis not present

## 2022-03-23 DIAGNOSIS — J449 Chronic obstructive pulmonary disease, unspecified: Secondary | ICD-10-CM | POA: Diagnosis not present

## 2022-03-23 DIAGNOSIS — Z992 Dependence on renal dialysis: Secondary | ICD-10-CM | POA: Diagnosis not present

## 2022-03-26 DIAGNOSIS — N186 End stage renal disease: Secondary | ICD-10-CM | POA: Diagnosis not present

## 2022-03-26 DIAGNOSIS — Z992 Dependence on renal dialysis: Secondary | ICD-10-CM | POA: Diagnosis not present

## 2022-03-27 DIAGNOSIS — Z794 Long term (current) use of insulin: Secondary | ICD-10-CM | POA: Diagnosis not present

## 2022-03-27 DIAGNOSIS — Z9981 Dependence on supplemental oxygen: Secondary | ICD-10-CM | POA: Diagnosis not present

## 2022-03-27 DIAGNOSIS — E1122 Type 2 diabetes mellitus with diabetic chronic kidney disease: Secondary | ICD-10-CM | POA: Diagnosis not present

## 2022-03-27 DIAGNOSIS — E1151 Type 2 diabetes mellitus with diabetic peripheral angiopathy without gangrene: Secondary | ICD-10-CM | POA: Diagnosis not present

## 2022-03-27 DIAGNOSIS — N186 End stage renal disease: Secondary | ICD-10-CM | POA: Diagnosis not present

## 2022-03-27 DIAGNOSIS — L089 Local infection of the skin and subcutaneous tissue, unspecified: Secondary | ICD-10-CM | POA: Diagnosis not present

## 2022-03-27 DIAGNOSIS — I132 Hypertensive heart and chronic kidney disease with heart failure and with stage 5 chronic kidney disease, or end stage renal disease: Secondary | ICD-10-CM | POA: Diagnosis not present

## 2022-03-27 DIAGNOSIS — I5032 Chronic diastolic (congestive) heart failure: Secondary | ICD-10-CM | POA: Diagnosis not present

## 2022-03-27 DIAGNOSIS — D631 Anemia in chronic kidney disease: Secondary | ICD-10-CM | POA: Diagnosis not present

## 2022-03-27 DIAGNOSIS — E11621 Type 2 diabetes mellitus with foot ulcer: Secondary | ICD-10-CM | POA: Diagnosis not present

## 2022-03-27 DIAGNOSIS — E1169 Type 2 diabetes mellitus with other specified complication: Secondary | ICD-10-CM | POA: Diagnosis not present

## 2022-03-27 DIAGNOSIS — Z992 Dependence on renal dialysis: Secondary | ICD-10-CM | POA: Diagnosis not present

## 2022-03-27 DIAGNOSIS — L97513 Non-pressure chronic ulcer of other part of right foot with necrosis of muscle: Secondary | ICD-10-CM | POA: Diagnosis not present

## 2022-03-27 DIAGNOSIS — M86371 Chronic multifocal osteomyelitis, right ankle and foot: Secondary | ICD-10-CM | POA: Diagnosis not present

## 2022-03-28 DIAGNOSIS — J45909 Unspecified asthma, uncomplicated: Secondary | ICD-10-CM | POA: Diagnosis not present

## 2022-03-28 DIAGNOSIS — M86171 Other acute osteomyelitis, right ankle and foot: Secondary | ICD-10-CM | POA: Diagnosis not present

## 2022-03-28 DIAGNOSIS — Z992 Dependence on renal dialysis: Secondary | ICD-10-CM | POA: Diagnosis not present

## 2022-03-28 DIAGNOSIS — I5040 Unspecified combined systolic (congestive) and diastolic (congestive) heart failure: Secondary | ICD-10-CM | POA: Diagnosis not present

## 2022-03-28 DIAGNOSIS — N186 End stage renal disease: Secondary | ICD-10-CM | POA: Diagnosis not present

## 2022-03-29 DIAGNOSIS — E1122 Type 2 diabetes mellitus with diabetic chronic kidney disease: Secondary | ICD-10-CM | POA: Diagnosis not present

## 2022-03-29 DIAGNOSIS — J9611 Chronic respiratory failure with hypoxia: Secondary | ICD-10-CM | POA: Diagnosis not present

## 2022-03-29 DIAGNOSIS — E1151 Type 2 diabetes mellitus with diabetic peripheral angiopathy without gangrene: Secondary | ICD-10-CM | POA: Diagnosis not present

## 2022-03-29 DIAGNOSIS — Z794 Long term (current) use of insulin: Secondary | ICD-10-CM | POA: Diagnosis not present

## 2022-03-29 DIAGNOSIS — L97513 Non-pressure chronic ulcer of other part of right foot with necrosis of muscle: Secondary | ICD-10-CM | POA: Diagnosis not present

## 2022-03-29 DIAGNOSIS — E1169 Type 2 diabetes mellitus with other specified complication: Secondary | ICD-10-CM | POA: Diagnosis not present

## 2022-03-29 DIAGNOSIS — I132 Hypertensive heart and chronic kidney disease with heart failure and with stage 5 chronic kidney disease, or end stage renal disease: Secondary | ICD-10-CM | POA: Diagnosis not present

## 2022-03-29 DIAGNOSIS — I70203 Unspecified atherosclerosis of native arteries of extremities, bilateral legs: Secondary | ICD-10-CM | POA: Diagnosis not present

## 2022-03-29 DIAGNOSIS — Z9981 Dependence on supplemental oxygen: Secondary | ICD-10-CM | POA: Diagnosis not present

## 2022-03-29 DIAGNOSIS — I5032 Chronic diastolic (congestive) heart failure: Secondary | ICD-10-CM | POA: Diagnosis not present

## 2022-03-29 DIAGNOSIS — J441 Chronic obstructive pulmonary disease with (acute) exacerbation: Secondary | ICD-10-CM | POA: Diagnosis not present

## 2022-03-29 DIAGNOSIS — N186 End stage renal disease: Secondary | ICD-10-CM | POA: Diagnosis not present

## 2022-03-29 DIAGNOSIS — E11621 Type 2 diabetes mellitus with foot ulcer: Secondary | ICD-10-CM | POA: Diagnosis not present

## 2022-03-29 DIAGNOSIS — Z7985 Long-term (current) use of injectable non-insulin antidiabetic drugs: Secondary | ICD-10-CM | POA: Diagnosis not present

## 2022-03-29 DIAGNOSIS — L089 Local infection of the skin and subcutaneous tissue, unspecified: Secondary | ICD-10-CM | POA: Diagnosis not present

## 2022-03-29 DIAGNOSIS — E1165 Type 2 diabetes mellitus with hyperglycemia: Secondary | ICD-10-CM | POA: Diagnosis not present

## 2022-03-29 DIAGNOSIS — Z992 Dependence on renal dialysis: Secondary | ICD-10-CM | POA: Diagnosis not present

## 2022-03-29 DIAGNOSIS — J9612 Chronic respiratory failure with hypercapnia: Secondary | ICD-10-CM | POA: Diagnosis not present

## 2022-03-29 DIAGNOSIS — I951 Orthostatic hypotension: Secondary | ICD-10-CM | POA: Diagnosis not present

## 2022-03-29 DIAGNOSIS — E872 Acidosis, unspecified: Secondary | ICD-10-CM | POA: Diagnosis not present

## 2022-03-29 DIAGNOSIS — D631 Anemia in chronic kidney disease: Secondary | ICD-10-CM | POA: Diagnosis not present

## 2022-03-29 DIAGNOSIS — M86371 Chronic multifocal osteomyelitis, right ankle and foot: Secondary | ICD-10-CM | POA: Diagnosis not present

## 2022-03-30 DIAGNOSIS — M86171 Other acute osteomyelitis, right ankle and foot: Secondary | ICD-10-CM | POA: Diagnosis not present

## 2022-03-30 DIAGNOSIS — Z992 Dependence on renal dialysis: Secondary | ICD-10-CM | POA: Diagnosis not present

## 2022-03-30 DIAGNOSIS — N186 End stage renal disease: Secondary | ICD-10-CM | POA: Diagnosis not present

## 2022-04-01 ENCOUNTER — Encounter: Payer: 59 | Attending: Physician Assistant | Admitting: Physician Assistant

## 2022-04-01 ENCOUNTER — Other Ambulatory Visit
Admission: RE | Admit: 2022-04-01 | Discharge: 2022-04-01 | Disposition: A | Discharge status: Home / Self Care | Payer: 59 | Source: Ambulatory Visit | Attending: Physician Assistant | Admitting: Physician Assistant

## 2022-04-01 DIAGNOSIS — L97512 Non-pressure chronic ulcer of other part of right foot with fat layer exposed: Secondary | ICD-10-CM | POA: Diagnosis not present

## 2022-04-01 DIAGNOSIS — N186 End stage renal disease: Secondary | ICD-10-CM | POA: Diagnosis not present

## 2022-04-01 DIAGNOSIS — M1990 Unspecified osteoarthritis, unspecified site: Secondary | ICD-10-CM | POA: Diagnosis not present

## 2022-04-01 DIAGNOSIS — Z992 Dependence on renal dialysis: Secondary | ICD-10-CM | POA: Diagnosis not present

## 2022-04-01 DIAGNOSIS — E1151 Type 2 diabetes mellitus with diabetic peripheral angiopathy without gangrene: Secondary | ICD-10-CM | POA: Diagnosis not present

## 2022-04-01 DIAGNOSIS — Z9981 Dependence on supplemental oxygen: Secondary | ICD-10-CM | POA: Diagnosis not present

## 2022-04-01 DIAGNOSIS — I132 Hypertensive heart and chronic kidney disease with heart failure and with stage 5 chronic kidney disease, or end stage renal disease: Secondary | ICD-10-CM | POA: Diagnosis not present

## 2022-04-01 DIAGNOSIS — E1140 Type 2 diabetes mellitus with diabetic neuropathy, unspecified: Secondary | ICD-10-CM | POA: Insufficient documentation

## 2022-04-01 DIAGNOSIS — L0889 Other specified local infections of the skin and subcutaneous tissue: Secondary | ICD-10-CM | POA: Insufficient documentation

## 2022-04-01 DIAGNOSIS — J449 Chronic obstructive pulmonary disease, unspecified: Secondary | ICD-10-CM | POA: Diagnosis not present

## 2022-04-01 DIAGNOSIS — E11621 Type 2 diabetes mellitus with foot ulcer: Secondary | ICD-10-CM | POA: Diagnosis not present

## 2022-04-01 DIAGNOSIS — E1122 Type 2 diabetes mellitus with diabetic chronic kidney disease: Secondary | ICD-10-CM | POA: Insufficient documentation

## 2022-04-01 DIAGNOSIS — I5042 Chronic combined systolic (congestive) and diastolic (congestive) heart failure: Secondary | ICD-10-CM | POA: Insufficient documentation

## 2022-04-01 DIAGNOSIS — M86371 Chronic multifocal osteomyelitis, right ankle and foot: Secondary | ICD-10-CM | POA: Diagnosis not present

## 2022-04-01 DIAGNOSIS — L97514 Non-pressure chronic ulcer of other part of right foot with necrosis of bone: Secondary | ICD-10-CM | POA: Insufficient documentation

## 2022-04-01 NOTE — Progress Notes (Addendum)
Kocourek, [name] ([id number]) [id number] Page 1 of 9 Visit Report for 04/01/2022 Chief Complaint Document Details Patient Name: Date of Service: [place], [name] 04/01/2022 11:00 A M Medical Record Number: [id number] Patient Account Number: [id number] Date of Birth/Sex: Treating RN: [birth year] (52 y.o. [name] Primary Care Provider: Evern Bio Other Clinician: Massie Kluver Referring Provider: Treating Provider/Extender: Darien Ramus Weeks in Treatment: 27 Information Obtained from: Patient Chief Complaint Right foot gangrene and osteomyelitis Electronic Signature(s) Signed: 04/01/2022 11:17:49 AM By: Worthy Keeler PA-C Entered By: Worthy Keeler on 04/01/2022 11:17:49 -------------------------------------------------------------------------------- HPI Details Patient Name: Date of Service: [place], [name] 04/01/2022 11:00 A M Medical Record Number: [id number] Patient Account Number: [id number] Date of Birth/Sex: Treating RN: 1970-10-29 (52 y.o. [name] Primary Care Provider: Evern Bio Other Clinician: Massie Kluver Referring Provider: Treating Provider/Extender: Darien Ramus Weeks in Treatment: 3 History of Present Illness HPI Description: 10/04/2020 upon evaluation today patient presents for initial inspection here in the clinic concerning a fairly concerning wound over her right fifth toe towards the medial aspect which actually probes all the way down to bone. There is evidence of necrotic muscle as well and I do see tendon exposed as well. Subsequently this does have me very concerned about where things stand here. Again this is the first time that I am seeing her and on top of everything else her ABIs are noncompressible. I think that arterial flow is the primary concern here. Of note the patient states this was first noted July 1. She had a x-ray on July 4 which was negative for bone infection.  She has been on 2 antibiotics she is unsure of the name. With that being said her most recent hemoglobin A1c was 8.1 that was December 2022. She otherwise does have a history obviously of diabetes mellitus type 2, hypertension, end-stage renal disease with dependence on renal dialysis, and she is also dependent on supplemental oxygen. Readmission: 05/14/2021 this is a patient whom I previously seen October 04, 2020. With that being said she is subsequently on 21 October 2020 ended up having an amputation of her fifth digit of her foot unfortunately. This had progressed fairly quickly into what sounds to been gangrene at that time. Subsequently right now based on what I am seeing the patient actually has a necrotic area on the end of her foot and has been recommended that the only option really here is going to be a amputation which would be a below-knee amputation. She is really not interested in that and wants to try to give this every chance it can to heal. For that reason I discussed with her that definitely we can see what we can do to try to improve this situation and try to help get this doing better as best we can. With that being said there is definitely a portion of her wound that is just not good to be able to heal no matter what it is already starting to auto amputate. Mcneel, [name] ([id number]) [id number] Page 2 of 9 This includes potentially digits of her foot as well based on what I am seeing. She does have confirmed osteomyelitis noted by MRI. She is also currently on IV vancomycin. She does undergo dialysis on Tuesday, Thursday, and Saturday. The patient does have a history of diabetes mellitus type 2, congestive heart failure, hypertension, end-stage renal disease, dependence on renal dialysis, and dependence on supplemental oxygen. With that being said I am  going to need to look into her heart failure and the significance of this as far as hyperbarics  is concerned before we initiate treatment. She is probably also going to need an updated EKG and chest x-ray unless its already been done recently. 05/28/2021 upon evaluation today patient appears to be doing well with regard to her wound all things considered. I am going to try to see what we can do about trimmed away some of the necrotic tissue that is really just hanging on here and there and not really helpful. Feel like if we can slowly start doing this maybe we will be able to get to the point where this is better and better as far as cleaning up the surface of the wound is concerned. Still as I explained I do believe some of her toes are not salvageable but again she is looking more towards autoamputation versus want to do anything definitive here from a surgical standpoint. I have been researching whether she would be an okay candidate for hyperbarics or not and I found that her x-ray but she had a chest x-ray recently was negative for any acute disease and appear to be stable. EKG was also stable. She did also have a cardiac echo which showed that her ejection fraction was 60 to 65% and therefore I think she is a candidate for hyperbarics. Again I do believe that this can help with preventing amputation which right now she has been told that her only option would be a below-knee amputation. Nonetheless I think that if we can get the heel leading to spread and the majority of this cleared away that it is possible we might even be able to get a surgeon just to surgically remove the nonviable toes and not have to go the full way of complete amputation. Nonetheless we do have a ways to go before we will get to that point. Either way I think the hyperbarics could be an excellent support as far as that is concerned. 06-05-2021 upon evaluation today patient's wound is showing some signs of improvement. This does seem to be slow and of course it is understandable considering what we are seeing. She has  discussed and looked up information about hyperbarics and in the end comes back today and states that she does want to proceed. I honestly do feel like that is her best chance at saving the foot. Even if we are able to get her into a surgeon after we obtain some good healing approximately so that there is not as much necrotic tissue in general she may even be able to have a transmetatarsal amputation and get this to heal. Again I am not saying this is can be an easy road but it may be her best road as far as thinking about how to achieve some type of complete healing as I do not believe the toes are viable at all to be honest. 07-06-2021 upon evaluation today patient appears to be doing well currently in regard to her foot all things considered. Again she unfortunately was unable to do the hyperbaric oxygen therapy this was due to her claustrophobia. Nonetheless she tells me that she is pleased with how the foot appears and she does feel like there is good improvement going on here. In general I think that we are on the right track. If she is going to take a very long time and to be honest the route of autoamputation which is pretty much the way this is  going is not a quick or easy road but nonetheless she seems to be taking care of her foot quite well. She is in good spirits. 07-27-2021 upon evaluation today patient appears to be doing well currently in regard to her foot all things considered. A lot of the eschar is starting to lift up around the edges which is good there is still significant necrotic tissue on the distal portion of the foot. This is slowly going to work its way off. Based on what I am seeing currently I do not believe that there is any evidence of infection which is good news. 08-31-2021 upon evaluation today patient's wound actually appears to be doing decently well. I do not see anything in particular to try to trim away today. With that being said she continues to slowly allow this to  do what is going to do as far as autoamputation is concerned. I did have another conversation with her today about the possibility of seeing a surgeon for a transmetatarsal amputation which again would help to get this to close faster and obviously I think would be a much speedy year and safer way to proceed with amputation. With that being said she tells me that she would really prefer to just let this go at "God pace". 09-28-2021 upon evaluation today patient's foot actually appears to be doing much better. Fortunately I do not see any signs of infection which is great news and overall I am extremely pleased with where we stand today. There does not appear to be any signs of active infection locally or systemically which is great news. No fevers, chills, nausea, vomiting, or diarrhea. 10-26-2021 upon evaluation today patient appears to be doing well currently in regard to her foot all things considered. Fortunately I do not see any evidence of active infection locally or systemically at this time which is great news. No fevers, chills, nausea, vomiting, or diarrhea. She does have some of the eschar that is lifting up even a little bit more compared to last time I saw her. 11-30-2021 upon evaluation today patient's wound is still continuing to have some areas that lift up although there is nothing really that I feel like I could trim away too much today to be honest. Fortunately I do not see any evidence of systemic infection though locally she has been concerned about some drainage I do not see anything that seems to be actively red or erythematous or warm to touch which is good news. No fevers, chills, nausea, vomiting, or diarrhea. 12-21-2021 upon evaluation patient's foot ulcer actually is showing signs of some improvement with healthier tissue noted at areas with eschar that keeps clearing off. There is a bit of the eschar that is loosening up today as well and I think we will get a be able to get  some of this cleared away that we have been working on for a while. Fortunately I do not see any evidence of infection locally or systemically at this time which is great news. No fevers, chills, nausea, vomiting, or diarrhea. 01-11-2022 upon evaluation today patient appears to be doing decently well in regard to her foot she still has a necrotic area on the tip of her foot but she is tolerating the dressing changes without complication. Fortunately there does not appear to be any signs of infection locally or systemically at this time which is great news. No fevers, chills, nausea, vomiting, or diarrhea. 02-01-2022 upon evaluation today patient continues to show signs of  improvement in regard to the area where we have been able to remove the eschar. Unfortunately the tip of the foot I think is the part that is completely dead and not going to be coming back as far as any healing is concerned. I have discussed this with the patient multiple times however she does not want to have any type of surgical intervention. Obviously in that regard we will try to do what we can as best we can. 02-15-2022 upon evaluation today patient appears to be doing well with regard to her foot. She is actually showing some signs of improvement she has had some drainage but nothing appears to be doing too poorly overall. I do not see any signs of infection and I am pleased in that regard. Fortunately I do not see any evidence of infection locally nor systemically which is great news. 03-01-2022 upon evaluation today patient appears to be doing well currently in regard to her wounds. She has been tolerating the dressing changes without complication. Fortunately I do not see any signs of active infection locally nor systemically which is great news. No fevers, chills, nausea, vomiting, or diarrhea. 03-15-2022 upon evaluation today patient's wound continues to show signs of loosening the splint some of the eschar is concerned on  the tip of her foot. Fortunately I do not see any evidence of infection systemically which is good news locally I feel like she is making good progress here. Overall I am extremely happy with where things stand. Especially considering the fact that really she does need to have an amputation of the necrotic portion of the end of her foot. Again if we got rid of those toes I do think that this could actually heal much more effectively and quickly with that being said she is completely against going down that road so we will try to do what we can as conservatively as we can while still making some progress here. 04-01-2022 upon evaluation today patient's foot unfortunately does not appear to be doing as well as I would like to see. I discussed with her today that I do believe she probably needs to be very mindful of this and how things are progressing. I explained as well that this is what I been most worried about the whole time that I been seeing her since really at least March of last year when I started seeing her again. Subsequently she notes that she has had more drainage also feel that the drainage is more discolored which is not good. Electronic Signature(s) Signed: 04/01/2022 5:49:12 PM By: Worthy Keeler PA-C Entered By: Worthy Keeler on 04/01/2022 17:49:12 Talcott, [name] ([id number]) [id number] Page 3 of 9 -------------------------------------------------------------------------------- Physical Exam Details Patient Name: Date of Service: [place], [name] 04/01/2022 11:00 A M Medical Record Number: [id number] Patient Account Number: [id number] Date of Birth/Sex: Treating RN: Sep 08, 1970 (52 y.o. [name] Primary Care Provider: Evern Bio Other Clinician: Massie Kluver Referring Provider: Treating Provider/Extender: Darien Ramus Weeks in Treatment: 80 Constitutional Obese and well-hydrated in no acute distress. Respiratory normal  breathing without difficulty. Psychiatric this patient is able to make decisions and demonstrates good insight into disease process. Alert and Oriented x 3. pleasant and cooperative. Notes Upon inspection patient's wound bed actually showed signs of some purulent drainage noted on the plantar aspect of her foot in regard to the dressing. This is not good and I am not extremely happy about this at all to be peripherally honest.  I do think that she is also having some weeping between the second and first toe this also has me concerned. I think that this is something that could develop rapidly and I gave her specific instructions of what to look for in order to go to the ER ASAP if this occurs. Electronic Signature(s) Signed: 04/01/2022 5:49:56 PM By: Worthy Keeler PA-C Previous Signature: 04/01/2022 5:49:44 PM Version By: Worthy Keeler PA-C Entered By: Worthy Keeler on 04/01/2022 17:49:56 -------------------------------------------------------------------------------- Physician Orders Details Patient Name: Date of Service: [place], [name] 04/01/2022 11:00 A M Medical Record Number: [id number] Patient Account Number: [id number] Date of Birth/Sex: Treating RN: August 29, 1970 (52 y.o. [name] Primary Care Provider: Evern Bio Other Clinician: Massie Kluver Referring Provider: Treating Provider/Extender: Darien Ramus Weeks in Treatment: 52 Verbal / Phone Orders: No Diagnosis Coding ICD-10 Coding Code Description M86.371 Chronic multifocal osteomyelitis, right ankle and foot E11.621 Type 2 diabetes mellitus with foot ulcer L97.514 Non-pressure chronic ulcer of other part of right foot with necrosis of bone Luff, [name] ([id number]) [id number] Page 4 of 9 I50.42 Chronic combined systolic (congestive) and diastolic (congestive) heart failure I10 Essential (primary) hypertension N18.6 End stage renal disease Z99.2 Dependence on renal  dialysis Z99.81 Dependence on supplemental oxygen Follow-up Appointments Return Appointment in 3 weeks. Nurse Visit as needed [facility] for wound care. May utilize formulary equivalent dressing for wound treatment orders unless otherwise specified. Home Health Nurse may visit [facility] to address patients wound care needs. Scheduled days for dressing changes to be completed; exception, patient has scheduled wound care visit that day. **Please direct any NON-WOUND related issues/requests for orders to patient's Primary Care Physician. **If current dressing causes regression in wound condition, may D/C ordered dressing product/s and apply Normal Saline Moist Dressing daily until next [facility] or Other MD appointment. **Notify [facility] of regression in wound condition at [phone]. Bathing/ Shower/ Hygiene Clean wound with Normal Saline or wound cleanser. Wash wounds with antibacterial soap and water. May shower; gently cleanse wound with antibacterial soap, rinse and pat dry prior to dressing wounds No tub bath. Off-Loading Open toe surgical shoe Medications-Please add to medication list. ntibiotic - gentamycin to open area of foot Topical A Wound Treatment Wound #2 - Foot Wound Laterality: Right, Circumferential Cleanser: Normal Saline 1 x Per Day/30 Days Discharge Instructions: Wash your hands with soap and water. Remove old dressing, discard into plastic bag and place into trash. Cleanse the wound with Normal Saline prior to applying a clean dressing using gauze sponges, not tissues or cotton balls. Do not scrub or use excessive force. Pat dry using gauze sponges, not tissue or cotton balls. Cleanser: Soap and Water 1 x Per Day/30 Days Discharge Instructions: Gently cleanse wound with antibacterial soap, rinse and pat dry prior to dressing wounds Topical: Gentamicin 1 x Per Day/30 Days Discharge Instructions:  Apply to open area of foot Prim Dressing: AquacelAg Advantage Dressing, 4x5 (in/in) (Dispense As Written) 1 x Per Day/30 Days ary Discharge Instructions: Apply to wound as directed Prim Dressing: Gauze 1 x Per Day/30 Days ary Discharge Instructions: soak with Betadine and wrap around toes Secondary Dressing: ABD Pad 5x9 (in/in) 1 x Per Day/30 Days Discharge Instructions: Cover with ABD pad Secured With: Medipore T - 68M Medipore H Soft Cloth Surgical T ape ape, 2x2 (in/yd) 1 x Per Day/30 Days Secured With: [employer] Sterile or Non-Sterile 6-ply 4.5x4 (yd/yd)  1 x Per Day/30 Days Discharge Instructions: Apply Kerlix as directed Secured With: Stretch Net Dressing, Latex-free, Size 5, Small-Head / Shoulder / Thigh 1 x Per Day/30 Days Laboratory Bacteria identified in Wound by Culture (MICRO) - wound culture obtained LOINC Code: [id number] Convenience Name: Wound culture routine Patient Medications llergies: Sulfa (Sulfonamide Antibiotics), Feraheme, pollen extracts, mold extracts, house dust mite A Notifications Medication Indication Start End 04/01/2022 gentamicin DOSE topical 0.1 % cream - cream topical once daily with each dressing change x 30 days 04/05/2022 doxycycline hyclate DOSE 1 - oral 100 mg capsule - 1 capsule oral twice a day x 30 days. Separate taking this medication and Auryxia by 2 hours to avoid interaction. [place], [name] ([id number]) [id number] Page 5 of 9 Electronic Signature(s) Signed: 04/05/2022 11:56:59 AM By: Worthy Keeler PA-C Previous Signature: 04/01/2022 3:27:19 PM Version By: Worthy Keeler PA-C Entered By: Worthy Keeler on 04/05/2022 11:56:59 -------------------------------------------------------------------------------- Problem List Details Patient Name: Date of Service: [place], [name] 04/01/2022 11:00 A M Medical Record Number: [id number] Patient Account Number: [id number] Date of Birth/Sex: Treating RN: 09-27-1970 (52 y.o.  [name] Primary Care Provider: Evern Bio Other Clinician: Massie Kluver Referring Provider: Treating Provider/Extender: Darien Ramus Weeks in Treatment: 64 Active Problems ICD-10 Encounter Code Description Active Date MDM Diagnosis M86.371 Chronic multifocal osteomyelitis, right ankle and foot 05/18/2021 No Yes E11.621 Type 2 diabetes mellitus with foot ulcer 05/18/2021 No Yes L97.514 Non-pressure chronic ulcer of other part of right foot with necrosis of bone 05/18/2021 No Yes I50.42 Chronic combined systolic (congestive) and diastolic (congestive) heart failure 05/18/2021 No Yes I10 Essential (primary) hypertension 05/18/2021 No Yes N18.6 End stage renal disease 05/18/2021 No Yes Z99.2 Dependence on renal dialysis 05/18/2021 No Yes Z99.81 Dependence on supplemental oxygen 05/18/2021 No Yes Inactive Problems Resolved Problems Electronic Signature(s) Guest, [name] ([id number]) [id number] Page 6 of 9 Signed: 04/01/2022 11:17:45 AM By: Worthy Keeler PA-C Entered By: Worthy Keeler on 04/01/2022 11:17:45 -------------------------------------------------------------------------------- Progress Note Details Patient Name: Date of Service: [place], [name] 04/01/2022 11:00 A M Medical Record Number: [id number] Patient Account Number: [id number] Date of Birth/Sex: Treating RN: 1970/08/19 (52 y.o. [name] Primary Care Provider: Evern Bio Other Clinician: Massie Kluver Referring Provider: Treating Provider/Extender: Darien Ramus Weeks in Treatment: 39 Subjective Chief Complaint Information obtained from Patient Right foot gangrene and osteomyelitis History of Present Illness (HPI) 10/04/2020 upon evaluation today patient presents for initial inspection here in the clinic concerning a fairly concerning wound over her right fifth toe towards the medial aspect which actually probes all the way down to bone.  There is evidence of necrotic muscle as well and I do see tendon exposed as well. Subsequently this does have me very concerned about where things stand here. Again this is the first time that I am seeing her and on top of everything else her ABIs are noncompressible. I think that arterial flow is the primary concern here. Of note the patient states this was first noted July 1. She had a x-ray on July 4 which was negative for bone infection. She has been on 2 antibiotics she is unsure of the name. With that being said her most recent hemoglobin A1c was 8.1 that was December 2022. She otherwise does have a history obviously of diabetes mellitus type 2, hypertension, end-stage renal disease with dependence on renal dialysis, and she is also dependent on supplemental oxygen. Readmission: 05/14/2021 this is a patient whom I  previously seen October 04, 2020. With that being said she is subsequently on 21 October 2020 ended up having an amputation of her fifth digit of her foot unfortunately. This had progressed fairly quickly into what sounds to been gangrene at that time. Subsequently right now based on what I am seeing the patient actually has a necrotic area on the end of her foot and has been recommended that the only option really here is going to be a amputation which would be a below-knee amputation. She is really not interested in that and wants to try to give this every chance it can to heal. For that reason I discussed with her that definitely we can see what we can do to try to improve this situation and try to help get this doing better as best we can. With that being said there is definitely a portion of her wound that is just not good to be able to heal no matter what it is already starting to auto amputate. This includes potentially digits of her foot as well based on what I am seeing. She does have confirmed osteomyelitis noted by MRI. She is also currently on IV vancomycin. She does undergo  dialysis on Tuesday, Thursday, and Saturday. The patient does have a history of diabetes mellitus type 2, congestive heart failure, hypertension, end-stage renal disease, dependence on renal dialysis, and dependence on supplemental oxygen. With that being said I am going to need to look into her heart failure and the significance of this as far as hyperbarics is concerned before we initiate treatment. She is probably also going to need an updated EKG and chest x-ray unless its already been done recently. 05/28/2021 upon evaluation today patient appears to be doing well with regard to her wound all things considered. I am going to try to see what we can do about trimmed away some of the necrotic tissue that is really just hanging on here and there and not really helpful. Feel like if we can slowly start doing this maybe we will be able to get to the point where this is better and better as far as cleaning up the surface of the wound is concerned. Still as I explained I do believe some of her toes are not salvageable but again she is looking more towards autoamputation versus want to do anything definitive here from a surgical standpoint. I have been researching whether she would be an okay candidate for hyperbarics or not and I found that her x-ray but she had a chest x-ray recently was negative for any acute disease and appear to be stable. EKG was also stable. She did also have a cardiac echo which showed that her ejection fraction was 60 to 65% and therefore I think she is a candidate for hyperbarics. Again I do believe that this can help with preventing amputation which right now she has been told that her only option would be a below-knee amputation. Nonetheless I think that if we can get the heel leading to spread and the majority of this cleared away that it is possible we might even be able to get a surgeon just to surgically remove the nonviable toes and not have to go the full way of complete  amputation. Nonetheless we do have a ways to go before we will get to that point. Either way I think the hyperbarics could be an excellent support as far as that is concerned. 06-05-2021 upon evaluation today patient's wound is showing some signs  of improvement. This does seem to be slow and of course it is understandable considering what we are seeing. She has discussed and looked up information about hyperbarics and in the end comes back today and states that she does want to proceed. I honestly do feel like that is her best chance at saving the foot. Even if we are able to get her into a surgeon after we obtain some good healing approximately so that there is not as much necrotic tissue in general she may even be able to have a transmetatarsal amputation and get this to heal. Again I am not saying this is can be an easy road but it may be her best road as far as thinking about how to achieve some type of complete healing as I do not believe the toes are viable at all to be honest. 07-06-2021 upon evaluation today patient appears to be doing well currently in regard to her foot all things considered. Again she unfortunately was unable to do the hyperbaric oxygen therapy this was due to her claustrophobia. Nonetheless she tells me that she is pleased with how the foot appears and she does feel like there is good improvement going on here. In general I think that we are on the right track. If she is going to take a very long time and to be honest the route of autoamputation which is pretty much the way this is going is not a quick or easy road but nonetheless she seems to be taking care of her foot quite well. She is in good spirits. 07-27-2021 upon evaluation today patient appears to be doing well currently in regard to her foot all things considered. A lot of the eschar is starting to lift up around the edges which is good there is still significant necrotic tissue on the distal portion of the foot. This  is slowly going to work its way off. Based on what I am seeing currently I do not believe that there is any evidence of infection which is good news. Plagge, [name] ([id number]) [id number] Page 7 of 9 08-31-2021 upon evaluation today patient's wound actually appears to be doing decently well. I do not see anything in particular to try to trim away today. With that being said she continues to slowly allow this to do what is going to do as far as autoamputation is concerned. I did have another conversation with her today about the possibility of seeing a surgeon for a transmetatarsal amputation which again would help to get this to close faster and obviously I think would be a much speedy year and safer way to proceed with amputation. With that being said she tells me that she would really prefer to just let this go at "God pace". 09-28-2021 upon evaluation today patient's foot actually appears to be doing much better. Fortunately I do not see any signs of infection which is great news and overall I am extremely pleased with where we stand today. There does not appear to be any signs of active infection locally or systemically which is great news. No fevers, chills, nausea, vomiting, or diarrhea. 10-26-2021 upon evaluation today patient appears to be doing well currently in regard to her foot all things considered. Fortunately I do not see any evidence of active infection locally or systemically at this time which is great news. No fevers, chills, nausea, vomiting, or diarrhea. She does have some of the eschar that is lifting up even a little bit more compared  to last time I saw her. 11-30-2021 upon evaluation today patient's wound is still continuing to have some areas that lift up although there is nothing really that I feel like I could trim away too much today to be honest. Fortunately I do not see any evidence of systemic infection though locally she has been concerned about  some drainage I do not see anything that seems to be actively red or erythematous or warm to touch which is good news. No fevers, chills, nausea, vomiting, or diarrhea. 12-21-2021 upon evaluation patient's foot ulcer actually is showing signs of some improvement with healthier tissue noted at areas with eschar that keeps clearing off. There is a bit of the eschar that is loosening up today as well and I think we will get a be able to get some of this cleared away that we have been working on for a while. Fortunately I do not see any evidence of infection locally or systemically at this time which is great news. No fevers, chills, nausea, vomiting, or diarrhea. 01-11-2022 upon evaluation today patient appears to be doing decently well in regard to her foot she still has a necrotic area on the tip of her foot but she is tolerating the dressing changes without complication. Fortunately there does not appear to be any signs of infection locally or systemically at this time which is great news. No fevers, chills, nausea, vomiting, or diarrhea. 02-01-2022 upon evaluation today patient continues to show signs of improvement in regard to the area where we have been able to remove the eschar. Unfortunately the tip of the foot I think is the part that is completely dead and not going to be coming back as far as any healing is concerned. I have discussed this with the patient multiple times however she does not want to have any type of surgical intervention. Obviously in that regard we will try to do what we can as best we can. 02-15-2022 upon evaluation today patient appears to be doing well with regard to her foot. She is actually showing some signs of improvement she has had some drainage but nothing appears to be doing too poorly overall. I do not see any signs of infection and I am pleased in that regard. Fortunately I do not see any evidence of infection locally nor systemically which is great  news. 03-01-2022 upon evaluation today patient appears to be doing well currently in regard to her wounds. She has been tolerating the dressing changes without complication. Fortunately I do not see any signs of active infection locally nor systemically which is great news. No fevers, chills, nausea, vomiting, or diarrhea. 03-15-2022 upon evaluation today patient's wound continues to show signs of loosening the splint some of the eschar is concerned on the tip of her foot. Fortunately I do not see any evidence of infection systemically which is good news locally I feel like she is making good progress here. Overall I am extremely happy with where things stand. Especially considering the fact that really she does need to have an amputation of the necrotic portion of the end of her foot. Again if we got rid of those toes I do think that this could actually heal much more effectively and quickly with that being said she is completely against going down that road so we will try to do what we can as conservatively as we can while still making some progress here. 04-01-2022 upon evaluation today patient's foot unfortunately does not appear to be  doing as well as I would like to see. I discussed with her today that I do believe she probably needs to be very mindful of this and how things are progressing. I explained as well that this is what I been most worried about the whole time that I been seeing her since really at least March of last year when I started seeing her again. Subsequently she notes that she has had more drainage also feel that the drainage is more discolored which is not good. Objective Constitutional Obese and well-hydrated in no acute distress. Vitals Time Taken: 11:13 AM, Temperature: 98.3 F, Pulse: 98 bpm, Respiratory Rate: 18 breaths/min, Blood Pressure: 109/68 mmHg. Respiratory normal breathing without difficulty. Psychiatric this patient is able to make decisions and demonstrates  good insight into disease process. Alert and Oriented x 3. pleasant and cooperative. General Notes: Upon inspection patient's wound bed actually showed signs of some purulent drainage noted on the plantar aspect of her foot in regard to the dressing. This is not good and I am not extremely happy about this at all to be peripherally honest. I do think that she is also having some weeping between the second and first toe this also has me concerned. I think that this is something that could develop rapidly and I gave her specific instructions of what to look for in order to go to the ER ASAP if this occurs. Integumentary (Hair, Skin) Wound #2 status is Open. Original cause of wound was Gradually Appeared. The date acquired was: 10/15/2020. The wound has been in treatment 45 weeks. The wound is located on the Right,Circumferential Foot. The wound measures 13.5cm length x 7.5cm width x 0.2cm depth; 79.522cm^2 area and 15.904cm^3 volume. There is Fat Layer (Subcutaneous Tissue) exposed. There is a medium amount of serosanguineous drainage noted. There is medium (34-66%) granulation within the wound bed. There is a medium (34-66%) amount of necrotic tissue within the wound bed including Eschar and Necrosis of Bone. 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 Nurse Visit as needed Home Health: [facility] for wound care. May utilize formulary equivalent dressing  for wound treatment orders unless otherwise specified. Home Health Nurse may visit PRN to address patientoos wound care needs. Scheduled days for dressing changes to be completed; exception, patient has scheduled wound care visit that day. **Please direct any NON-WOUND related issues/requests for orders to patient's Primary Care Physician. **If current dressing causes regression in wound condition, may D/C ordered dressing product/s and apply Normal Saline Moist Dressing daily until next [facility] or Other MD appointment. **Notify [facility] of regression in wound condition at [phone]. Bathing/ Shower/ Hygiene: Clean wound with Normal Saline or wound cleanser. Wash wounds with antibacterial soap and water. May shower; gently cleanse wound with antibacterial soap, rinse and pat dry prior to dressing wounds No tub bath. Off-Loading: Open toe surgical shoe Medications-Please add to medication list.: Topical Antibiotic - gentamycin to open area of foot Laboratory ordered were: Wound culture routine - wound culture obtained The following medication(s) was prescribed: gentamicin topical 0.1 % cream cream  topical once daily with each dressing change x 30 days starting 04/01/2022 doxycycline hyclate oral 100 mg capsule 1 1 capsule oral twice a day x 30 days. Separate taking this medication and Auryxia by 2 hours to avoid interaction. starting 04/05/2022 WOUND #2: - Foot Wound Laterality: Right, Circumferential Cleanser: Normal Saline 1 x Per Day/30 Days Discharge Instructions: Wash your hands with soap and water. Remove old dressing, discard into plastic bag and place into trash. Cleanse the wound with Normal Saline prior to applying a clean dressing using gauze sponges, not tissues or cotton balls. Do not scrub or use excessive force. Pat dry using gauze sponges, not tissue or cotton balls. Cleanser: Soap and Water 1 x Per Day/30 Days Discharge Instructions: Gently cleanse  wound with antibacterial soap, rinse and pat dry prior to dressing wounds Topical: Gentamicin 1 x Per Day/30 Days Discharge Instructions: Apply to open area of foot Prim Dressing: AquacelAg Advantage Dressing, 4x5 (in/in) (Dispense As Written) 1 x Per Day/30 Days ary Discharge Instructions: Apply to wound as directed Prim Dressing: Gauze 1 x Per Day/30 Days ary Discharge Instructions: soak with Betadine and wrap around toes Secondary Dressing: ABD Pad 5x9 (in/in) 1 x Per Day/30 Days Discharge Instructions: Cover with ABD pad Secured With: Medipore T - 57M Medipore H Soft Cloth Surgical T ape ape, 2x2 (in/yd) 1 x Per Day/30 Days Secured With: [employer] Sterile or Non-Sterile 6-ply 4.5x4 (yd/yd) 1 x Per Day/30 Days Discharge Instructions: Apply Kerlix as directed Secured With: [employer] Dressing, Latex-free, Size 5, Small-Head / Shoulder / Thigh 1 x Per Day/30 Days 1. I made it very clear for the patient that if anything starts to show up such as fever, chills, nausea, vomiting, diarrhea, or mental confusion which I explained to her family member with her today she needs to go to the ER ASAP. 2. With regard to treatment I am going to start with gentamicin cream to the open area not on the dry eschar area in regard to her foot working to see if this will help while we hold for the culture results. Depending on the results we may need to get in touch with dialysis to see if they can help [place] with some IV antibiotic therapy. 3. I am also can recommend that the patient should try to stay off of this is much as possible she is obviously been doing this already which is great news. Number form also can recommend the patient should look for any signs or symptoms such as redness or streaking up her leg and if any of this occurs that should also be a problem go to the ER as soon as possible. We will see patient back for reevaluation in 1 week here in the clinic. If anything worsens or changes patient  will contact our office for additional recommendations. I do believe that the patient is at high risk for amputation which is why I am trying to be as aggressive as possible but again I need to know what organism we may be dealing with the best know how to appropriately treat this. Addendum on 04-05-2022: I did review patient's culture today and subsequently based on what I am seeing I would go ahead and send in a prescription for doxycycline for her. She will take this postdialysis on dialysis days and also there should not be any major interactions with her other medications I think this is good to be the best way to go initially. Will see how things proceed  I do not have the final report is still pending but nonetheless since it has come up on the weekend I want her to have something on board she can continue with the topical gentamicin as well. Guild, [name] ([id number]) [id number] Page 9 of 9 Electronic Signature(s) Signed: 04/05/2022 11:58:29 AM By: Worthy Keeler PA-C Previous Signature: 04/01/2022 5:51:06 PM Version By: Worthy Keeler PA-C Entered By: Worthy Keeler on 04/05/2022 11:58:29 -------------------------------------------------------------------------------- SuperBill Details Patient Name: Date of Service: [place], [name] 04/01/2022 Medical Record Number: [id number] Patient Account Number: [id number] Date of Birth/Sex: Treating RN: Jan 15, 1971 (52 y.o. [name] Primary Care Provider: Evern Bio Other Clinician: Massie Kluver Referring Provider: Treating Provider/Extender: Darien Ramus Weeks in Treatment: 45 Diagnosis Coding ICD-10 Codes Code Description M86.371 Chronic multifocal osteomyelitis, right ankle and foot E11.621 Type 2 diabetes mellitus with foot ulcer L97.514 Non-pressure chronic ulcer of other part of right foot with necrosis of bone I50.42 Chronic combined systolic (congestive) and diastolic  (congestive) heart failure I10 Essential (primary) hypertension N18.6 End stage renal disease Z99.2 Dependence on renal dialysis Z99.81 Dependence on supplemental oxygen Facility Procedures : 7 CPT4 Code: [id number] Description: 99213 - WOUND CARE VISIT-LEV 3 EST PT Modifier: Quantity: 1 Physician Procedures : CPT4 Code Description Modifier [id number] - WC PHYS LEVEL 4 - EST PT ICD-10 Diagnosis Description M86.371 Chronic multifocal osteomyelitis, right ankle and foot E11.621 Type 2 diabetes mellitus with foot ulcer L97.514 Non-pressure chronic ulcer of  other part of right foot with necrosis of bone I50.42 Chronic combined systolic (congestive) and diastolic (congestive) heart failure Quantity: 1 Electronic Signature(s) Signed: 04/01/2022 5:55:00 PM By: Worthy Keeler PA-C Entered By: Worthy Keeler on 04/01/2022 17:55:00

## 2022-04-02 DIAGNOSIS — Z992 Dependence on renal dialysis: Secondary | ICD-10-CM | POA: Diagnosis not present

## 2022-04-02 DIAGNOSIS — N186 End stage renal disease: Secondary | ICD-10-CM | POA: Diagnosis not present

## 2022-04-02 DIAGNOSIS — M86171 Other acute osteomyelitis, right ankle and foot: Secondary | ICD-10-CM | POA: Diagnosis not present

## 2022-04-03 DIAGNOSIS — J441 Chronic obstructive pulmonary disease with (acute) exacerbation: Secondary | ICD-10-CM | POA: Diagnosis not present

## 2022-04-03 DIAGNOSIS — L97513 Non-pressure chronic ulcer of other part of right foot with necrosis of muscle: Secondary | ICD-10-CM | POA: Diagnosis not present

## 2022-04-03 DIAGNOSIS — N186 End stage renal disease: Secondary | ICD-10-CM | POA: Diagnosis not present

## 2022-04-03 DIAGNOSIS — J9611 Chronic respiratory failure with hypoxia: Secondary | ICD-10-CM | POA: Diagnosis not present

## 2022-04-03 DIAGNOSIS — I951 Orthostatic hypotension: Secondary | ICD-10-CM | POA: Diagnosis not present

## 2022-04-03 DIAGNOSIS — Z7985 Long-term (current) use of injectable non-insulin antidiabetic drugs: Secondary | ICD-10-CM | POA: Diagnosis not present

## 2022-04-03 DIAGNOSIS — E1122 Type 2 diabetes mellitus with diabetic chronic kidney disease: Secondary | ICD-10-CM | POA: Diagnosis not present

## 2022-04-03 DIAGNOSIS — M86371 Chronic multifocal osteomyelitis, right ankle and foot: Secondary | ICD-10-CM | POA: Diagnosis not present

## 2022-04-03 DIAGNOSIS — E1151 Type 2 diabetes mellitus with diabetic peripheral angiopathy without gangrene: Secondary | ICD-10-CM | POA: Diagnosis not present

## 2022-04-03 DIAGNOSIS — I5032 Chronic diastolic (congestive) heart failure: Secondary | ICD-10-CM | POA: Diagnosis not present

## 2022-04-03 DIAGNOSIS — I132 Hypertensive heart and chronic kidney disease with heart failure and with stage 5 chronic kidney disease, or end stage renal disease: Secondary | ICD-10-CM | POA: Diagnosis not present

## 2022-04-03 DIAGNOSIS — Z992 Dependence on renal dialysis: Secondary | ICD-10-CM | POA: Diagnosis not present

## 2022-04-03 DIAGNOSIS — D631 Anemia in chronic kidney disease: Secondary | ICD-10-CM | POA: Diagnosis not present

## 2022-04-03 DIAGNOSIS — Z9981 Dependence on supplemental oxygen: Secondary | ICD-10-CM | POA: Diagnosis not present

## 2022-04-03 DIAGNOSIS — J9612 Chronic respiratory failure with hypercapnia: Secondary | ICD-10-CM | POA: Diagnosis not present

## 2022-04-03 DIAGNOSIS — E872 Acidosis, unspecified: Secondary | ICD-10-CM | POA: Diagnosis not present

## 2022-04-03 DIAGNOSIS — L089 Local infection of the skin and subcutaneous tissue, unspecified: Secondary | ICD-10-CM | POA: Diagnosis not present

## 2022-04-03 DIAGNOSIS — E1165 Type 2 diabetes mellitus with hyperglycemia: Secondary | ICD-10-CM | POA: Diagnosis not present

## 2022-04-03 DIAGNOSIS — Z794 Long term (current) use of insulin: Secondary | ICD-10-CM | POA: Diagnosis not present

## 2022-04-03 DIAGNOSIS — E11621 Type 2 diabetes mellitus with foot ulcer: Secondary | ICD-10-CM | POA: Diagnosis not present

## 2022-04-03 DIAGNOSIS — E1169 Type 2 diabetes mellitus with other specified complication: Secondary | ICD-10-CM | POA: Diagnosis not present

## 2022-04-03 DIAGNOSIS — I70203 Unspecified atherosclerosis of native arteries of extremities, bilateral legs: Secondary | ICD-10-CM | POA: Diagnosis not present

## 2022-04-03 NOTE — Progress Notes (Signed)
Siers, [name] ([id number]) [id number] Page 1 of 10 Visit Report for 04/01/2022 Arrival Information Details Patient Name: Date of Service: [place], [place] [name] M. 04/01/2022 11:00 A M Medical Record Number: [id number] Patient Account Number: [id number] Date of Birth/Sex: Treating RN: [birth year] (52 y.o. [name] Primary Care Stan Cantave: Evern Bio Other Clinician: Massie Kluver Referring Jilda Kress: Treating Betsi Crespi/Extender: Darien Ramus Weeks in Treatment: 63 Visit Information History Since Last Visit All ordered tests and consults were completed: No Patient Arrived: Wheel Chair Added or deleted any medications: No Arrival Time: 11:11 Any new allergies or adverse reactions: No Transfer Assistance: EasyPivot Patient Lift Had a fall or experienced change in No Patient Identification Verified: Yes activities of daily living that may affect Secondary Verification Process Completed: Yes risk of falls: Patient Requires Transmission-Based Precautions: No Signs or symptoms of abuse/neglect since No Patient Has Alerts: No last visito Hospitalized since last visit: No Implantable device outside of the clinic No excluding cellular tissue based products placed in the center since last visit: Has Dressing in Place as Prescribed: Yes Has Footwear/Offloading in Place as Yes Prescribed: Left: Surgical Shoe with Pressure Relief Insole Pain Present Now: Yes Electronic Signature(s) Signed: 04/02/2022 4:49:02 PM By: Massie Kluver Entered By: Massie Kluver on 04/01/2022 11:12:40 -------------------------------------------------------------------------------- Clinic Level of Care Assessment Details Patient Name: Date of Service: [place], [place] [name] M. 04/01/2022 11:00 A M Medical Record Number: [id number] Patient Account Number: [id number] Date of Birth/Sex: Treating RN: 03/08/1970 (51 y.o. [name] Primary Care Taden Witter: Evern Bio Other  Clinician: Massie Kluver Referring Bathsheba Durrett: Treating Yosiah Jasmin/Extender: Darien Ramus Weeks in Treatment: 53 Clinic Level of Care Assessment Items TOOL 4 Quantity Score Schey, [name] ([id number]) [id number] Page 2 of 10 '[]'$  - 0 Use when only an EandM is performed on FOLLOW-UP visit ASSESSMENTS - Nursing Assessment / Reassessment X- 1 10 Reassessment of Co-morbidities (includes updates in patient status) X- 1 5 Reassessment of Adherence to Treatment Plan ASSESSMENTS - Wound and Skin A ssessment / Reassessment X - Simple Wound Assessment / Reassessment - one wound 1 5 '[]'$  - 0 Complex Wound Assessment / Reassessment - multiple wounds '[]'$  - 0 Dermatologic / Skin Assessment (not related to wound area) ASSESSMENTS - Focused Assessment '[]'$  - 0 Circumferential Edema Measurements - multi extremities '[]'$  - 0 Nutritional Assessment / Counseling / Intervention '[]'$  - 0 Lower Extremity Assessment (monofilament, tuning fork, pulses) '[]'$  - 0 Peripheral Arterial Disease Assessment (using hand held doppler) ASSESSMENTS - Ostomy and/or Continence Assessment and Care '[]'$  - 0 Incontinence Assessment and Management '[]'$  - 0 Ostomy Care Assessment and Management (repouching, etc.) PROCESS - Coordination of Care X - Simple Patient / Family Education for ongoing care 1 15 '[]'$  - 0 Complex (extensive) Patient / Family Education for ongoing care '[]'$  - 0 Staff obtains Programmer, systems, Records, T Results / Process Orders est '[]'$  - 0 Staff telephones HHA, Nursing Homes / Clarify orders / etc '[]'$  - 0 Routine Transfer to another Facility (non-emergent condition) '[]'$  - 0 Routine Hospital Admission (non-emergent condition) '[]'$  - 0 New Admissions / Biomedical engineer / Ordering NPWT Apligraf, etc. , '[]'$  - 0 Emergency Hospital Admission (emergent condition) X- 1 10 Simple Discharge Coordination '[]'$  - 0 Complex (extensive) Discharge Coordination PROCESS - Special  Needs '[]'$  - 0 Pediatric / Minor Patient Management '[]'$  - 0 Isolation Patient Management '[]'$  - 0 Hearing / Language / Visual special needs '[]'$  - 0 Assessment of Community assistance (transportation, D/C planning, etc.) '[]'$  - 0  Additional assistance / Altered mentation '[]'$  - 0 Support Surface(s) Assessment (bed, cushion, seat, etc.) INTERVENTIONS - Wound Cleansing / Measurement X - Simple Wound Cleansing - one wound 1 5 '[]'$  - 0 Complex Wound Cleansing - multiple wounds X- 1 5 Wound Imaging (photographs - any number of wounds) '[]'$  - 0 Wound Tracing (instead of photographs) X- 1 5 Simple Wound Measurement - one wound '[]'$  - 0 Complex Wound Measurement - multiple wounds INTERVENTIONS - Wound Dressings '[]'$  - 0 Small Wound Dressing one or multiple wounds X- 1 15 Medium Wound Dressing one or multiple wounds '[]'$  - 0 Large Wound Dressing one or multiple wounds Gassert, [name] ([id number]) [id number] Page 3 of 10 X- 1 5 Application of Medications - topical '[]'$  - 0 Application of Medications - injection INTERVENTIONS - Miscellaneous '[]'$  - 0 External ear exam X- 1 5 Specimen Collection (cultures, biopsies, blood, body fluids, etc.) X- 1 5 Specimen(s) / Culture(s) sent or taken to Lab for analysis '[]'$  - 0 Patient Transfer (multiple staff / Harrel Lemon Lift / Similar devices) '[]'$  - 0 Simple Staple / Suture removal (25 or less) '[]'$  - 0 Complex Staple / Suture removal (26 or more) '[]'$  - 0 Hypo / Hyperglycemic Management (close monitor of Blood Glucose) '[]'$  - 0 Ankle / Brachial Index (ABI) - do not check if billed separately X- 1 5 Vital Signs Has the patient been seen at the hospital within the last three years: Yes Total Score: 95 Level Of Care: New/Established - Level 3 Electronic Signature(s) Signed: 04/02/2022 4:49:02 PM By: Massie Kluver Entered By: Massie Kluver on 04/01/2022  11:47:45 -------------------------------------------------------------------------------- Encounter Discharge Information Details Patient Name: Date of Service: [place], [place] [name] M. 04/01/2022 11:00 A M Medical Record Number: [id number] Patient Account Number: [id number] Date of Birth/Sex: Treating RN: 12-07-1970 (52 y.o. [name] Primary Care Julian Askin: Evern Bio Other Clinician: Massie Kluver Referring Susen Haskew: Treating Kaeson Kleinert/Extender: Darien Ramus Weeks in Treatment: 68 Encounter Discharge Information Items Discharge Condition: Stable Ambulatory Status: Wheelchair Discharge Destination: Home Transportation: Private Auto Accompanied By: husband Schedule Follow-up Appointment: Yes Clinical Summary of Care: Electronic Signature(s) Signed: 04/02/2022 4:49:02 PM By: Massie Kluver Entered By: Massie Kluver on 04/01/2022 12:17:24 Hocutt, [name] ([id number]) [id number] Page 4 of 10 -------------------------------------------------------------------------------- Lower Extremity Assessment Details Patient Name: Date of Service: [place], [place] [name] M. 04/01/2022 11:00 A M Medical Record Number: [id number] Patient Account Number: [id number] Date of Birth/Sex: Treating RN: 1970/04/12 (52 y.o. [name] Primary Care Cherre Kothari: Evern Bio Other Clinician: Massie Kluver Referring Bryce Kimble: Treating Lars Jeziorski/Extender: Darien Ramus Weeks in Treatment: 77 Electronic Signature(s) Signed: 04/02/2022 9:10:22 AM By: Gretta Cool BSN, RN, CWS, Kim RN, BSN Signed: 04/02/2022 4:49:02 PM By: Massie Kluver Entered By: Massie Kluver on 04/01/2022 11:27:04 -------------------------------------------------------------------------------- Multi Wound Chart Details Patient Name: Date of Service: [place], [place] [name] M. 04/01/2022 11:00 A M Medical Record Number: [id number] Patient Account Number: [id number] Date of Birth/Sex: Treating RN: [birth year]  (52 y.o. [name] Primary Care Hildegarde Dunaway: Evern Bio Other Clinician: Massie Kluver Referring Di Jasmer: Treating Maryori Weide/Extender: Darien Ramus Weeks in Treatment: 36 Vital Signs Height(in): Pulse(bpm): 98 Weight(lbs): Blood Pressure(mmHg): 109/68 Body Mass Index(BMI): Temperature(F): 98.3 Respiratory Rate(breaths/min): 18 [2:Photos:] [N/A:N/A] Right, Circumferential Foot N/A N/A Wound Location: Gradually Appeared N/A N/A Wounding Event: Diabetic Wound/Ulcer of the Lower N/A N/A Primary Etiology: Extremity Cataracts, Anemia, Asthma, Chronic N/A N/A Comorbid History: Obstructive Pulmonary Disease (COPD), Congestive Heart Failure, Hypertension, Type II Diabetes, End Stage Renal Disease, Osteoarthritis, Neuropathy 10/15/2020  N/A N/A Date Acquired: 41 N/A N/A Weeks of Treatment: Open N/A N/A Wound Status: No N/A N/A Wound Recurrence: Yes N/A N/A Pending A mputation on Presentation: 13.5x7.5x0.2 N/A N/A Measurements L x W x D (cm) 79.522 N/A N/A A (cm) : rea 15.904 N/A N/A Volume (cm) : 25.00% N/A N/A % Reduction in A rea: 92.50% N/A N/A % Reduction in Volume: Grade 4 N/A N/A Classification: Medium N/A N/A Exudate A mount: Serosanguineous N/A N/A Exudate Type: red, brown N/A N/A Exudate Color: Medium (34-66%) N/A N/A Granulation A mount: Medium (34-66%) N/A N/A Necrotic A mount: Eschar, Necrosis of Bone N/A N/A Necrotic Tissue: Fat Layer (Subcutaneous Tissue): Yes N/A N/A Exposed Structures: Fascia: No Tendon: No Muscle: No Joint: No Bone: No Treatment Notes Electronic Signature(s) Signed: 04/02/2022 4:49:02 PM By: Massie Kluver Entered By: Massie Kluver on 04/01/2022 11:27:08 -------------------------------------------------------------------------------- Multi-Disciplinary Care Plan Details Patient Name: Date of Service: [place], [place] [name] M. 04/01/2022 11:00 A M Medical Record Number: [id number] Patient Account  Number: [id number] Date of Birth/Sex: Treating RN: [birth year] (52 y.o. [name] Primary Care Jelene Albano: Evern Bio Other Clinician: Massie Kluver Referring Shannan Slinker: Treating Bellamia Ferch/Extender: Darien Ramus Weeks in Treatment: 57 Active Inactive HBO Nursing Diagnoses: Anxiety related to feelings of confinement associated with the hyperbaric oxygen chamber Anxiety related to knowledge deficit of hyperbaric oxygen therapy and treatment procedures Discomfort related to temperature and humidity changes inside hyperbaric chamber Potential for barotraumas to ears, sinuses, teeth, and lungs or cerebral gas embolism related to changes in atmospheric pressure inside hyperbaric oxygen chamber Potential for oxygen toxicity seizures related to delivery of 100% oxygen at an increased atmospheric pressure Rudder, [name] ([id number]) [id number] Page 6 of 10 Potential for pulmonary oxygen toxicity related to delivery of 100% oxygen at an increased atmospheric pressure Goals: Barotrauma will be prevented during HBO2 Date Initiated: 06/05/2021 T arget Resolution Date: 06/05/2021 Goal Status: Active Patient and/or family will be able to state/discuss factors appropriate to the management of their disease process during treatment Date Initiated: 06/05/2021 T arget Resolution Date: 06/05/2021 Goal Status: Active Patient will tolerate the hyperbaric oxygen therapy treatment Date Initiated: 06/05/2021 T arget Resolution Date: 06/05/2021 Goal Status: Active Patient will tolerate the internal climate of the chamber Date Initiated: 06/05/2021 T arget Resolution Date: 06/05/2021 Goal Status: Active Patient/caregiver will verbalize understanding of HBO goals, rationale, procedures and potential hazards Date Initiated: 06/05/2021 T arget Resolution Date: 06/05/2021 Goal Status: Active Signs and symptoms of pulmonary oxygen toxicity will be recognized and promptly  addressed Date Initiated: 06/05/2021 T arget Resolution Date: 06/05/2021 Goal Status: Active Signs and symptoms of seizure will be recognized and promptly addressed ; seizing patients will suffer no harm Date Initiated: 06/05/2021 T arget Resolution Date: 06/05/2021 Goal Status: Active Interventions: Administer a five (5) minute air break for patient if signs and symptoms of seizure appear and notify the hyperbaric physician Administer decongestants, per physician orders, prior to HBO2 Administer the correct therapeutic gas delivery based on the patients needs and limitations, per physician order Assess and provide for patients comfort related to the hyperbaric environment and equalization of middle ear Assess for signs and symptoms related to adverse events, including but not limited to confinement anxiety, pneumothorax, oxygen toxicity and baurotrauma Assess patient for any history of confinement anxiety Assess patient's knowledge and expectations regarding hyperbaric medicine and provide education related to the hyperbaric environment, goals of treatment and prevention of adverse events Implement protocols to decrease risk of pneumothorax in high risk patients Notes:  Osteomyelitis Nursing Diagnoses: Infection: osteomyelitis Knowledge deficit related to disease process and management Potential for infection: osteomyelitis Goals: Diagnostic evaluation for osteomyelitis completed as ordered Date Initiated: 06/05/2021 Target Resolution Date: 06/05/2021 Goal Status: Active Patient/caregiver will verbalize understanding of disease process and disease management Date Initiated: 06/05/2021 Target Resolution Date: 06/05/2021 Goal Status: Active [name] osteomyelitis will resolve Date Initiated: 06/05/2021 Target Resolution Date: 06/05/2021 Goal Status: Active Signs and symptoms for osteomyelitis will be recognized and promptly addressed Date Initiated: 06/05/2021 Target Resolution Date:  06/05/2021 Goal Status: Active Interventions: Assess for signs and symptoms of osteomyelitis resolution every visit Provide education on osteomyelitis Notes: Wound/Skin Impairment Nursing Diagnoses: Impaired tissue integrity Knowledge deficit related to ulceration/compromised skin integrity Goals: Ulcer/skin breakdown will have a volume reduction of 30% by week 4 Date Initiated: 05/18/2021 Target Resolution Date: 06/15/2021 Goal Status: Active Ulcer/skin breakdown will have a volume reduction of 50% by week 8 Giannone, [name] ([id number]) [id number] Page 7 of 10 Date Initiated: 05/18/2021 Target Resolution Date: 07/13/2021 Goal Status: Active Ulcer/skin breakdown will have a volume reduction of 80% by week 12 Date Initiated: 05/18/2021 Target Resolution Date: 08/10/2021 Goal Status: Active Ulcer/skin breakdown will heal within 14 weeks Date Initiated: 05/18/2021 Target Resolution Date: 08/24/2021 Goal Status: Active Interventions: Assess patient/caregiver ability to obtain necessary supplies Assess patient/caregiver ability to perform ulcer/skin care regimen upon admission and as needed Assess ulceration(s) every visit Provide education on ulcer and skin care Notes: Electronic Signature(s) Signed: 04/02/2022 9:10:22 AM By: Gretta Cool, BSN, RN, CWS, Kim RN, BSN Signed: 04/02/2022 4:49:02 PM By: Massie Kluver Entered By: Massie Kluver on 04/01/2022 11:48:40 -------------------------------------------------------------------------------- Pain Assessment Details Patient Name: Date of Service: [place], [place] [name] M. 04/01/2022 11:00 A M Medical Record Number: [id number] Patient Account Number: [id number] Date of Birth/Sex: Treating RN: 1970-10-10 (52 y.o. [name] Primary Care Raechelle Sarti: Evern Bio Other Clinician: Massie Kluver Referring [place] Segler: Treating Ameerah Huffstetler/Extender: Darien Ramus Weeks in Treatment: 79 Active Problems Location of Pain  Severity and Description of Pain Patient Has Paino Yes Site Locations Pain Location: Pain in Ulcers Duration of the Pain. Constant / Intermittento Constant Rate the pain. Current Pain Level: 3 Character of Pain Describe the Pain: Aching, Other: prickly pain Pain Management and Medication Current Pain Management: Medication: Yes Cold Application: No Rest: No Massage: No Labreck, [name] ([id number]) [id number] Page 8 of 10 Activity: No T.E.N.S.: No Heat Application: No Leg drop or elevation: No Is the Current Pain Management Adequate: Inadequate How does your wound impact your activities of daily livingo Sleep: No Bathing: No Appetite: No Relationship With Others: No Bladder Continence: No Emotions: No Bowel Continence: No Work: No Toileting: No Drive: No Dressing: No Hobbies: No Engineer, maintenance) Signed: 04/02/2022 9:10:22 AM By: Gretta Cool, BSN, RN, CWS, Kim RN, BSN Signed: 04/02/2022 4:49:02 PM By: Massie Kluver Entered By: Massie Kluver on 04/01/2022 11:15:19 -------------------------------------------------------------------------------- Patient/Caregiver Education Details Patient Name: Date of Service: [place], [place] [name] M. 2/5/2024andnbsp11:00 A M Medical Record Number: [id number] Patient Account Number: [id number] Date of Birth/Gender: Treating RN: 1970-05-28 (52 y.o. [name] Primary Care Physician: Evern Bio Other Clinician: Massie Kluver Referring Physician: Treating Physician/Extender: Darien Ramus Weeks in Treatment: 57 Education Assessment Education Provided To: Patient and Caregiver Education Topics Provided Infection: Handouts: Hygiene and Infection Prevention, Other: signs and symptoms of infection Methods: Explain/Verbal Responses: State content correctly Electronic Signature(s) Signed: 04/02/2022 4:49:02 PM By: Massie Kluver Entered By: Massie Kluver on 04/01/2022  11:48:31 -------------------------------------------------------------------------------- Wound Assessment Details Patient Name: Date of Service:  [place], [place] [name] M. 04/01/2022 11:00 A M Gieske, [name] ([id number]) [id number] Page 9 of 10 Medical Record Number: [id number] Patient Account Number: [id number] Date of Birth/Sex: Treating RN: [birth year] (52 y.o. [name] Primary Care Alice Burnside: Evern Bio Other Clinician: Massie Kluver Referring Siennah Barrasso: Treating Juliette Standre/Extender: Darien Ramus Weeks in Treatment: 45 Wound Status Wound Number: 2 Primary Diabetic Wound/Ulcer of the Lower Extremity Etiology: Wound Location: Right, Circumferential Foot Wound Open Wounding Event: Gradually Appeared Status: Date Acquired: 10/15/2020 Notes: open areas to midline right foot, lateral right foot, and top of right Weeks Of Treatment: 45 foot Clustered Wound: No Comorbid Cataracts, Anemia, Asthma, Chronic Obstructive Pulmonary Pending Amputation On Presentation History: Disease (COPD), Congestive Heart Failure, Hypertension, Type II Diabetes, End Stage Renal Disease, Osteoarthritis, Neuropathy Photos Wound Measurements Length: (cm) 13.5 Width: (cm) 7.5 Depth: (cm) 0.2 Area: (cm) 79.522 Volume: (cm) 15.904 % Reduction in Area: 25% % Reduction in Volume: 92.5% Wound Description Classification: Grade 4 Exudate Amount: Medium Exudate Type: Serosanguineous Exudate Color: red, brown Foul Odor After Cleansing: No Slough/Fibrino Yes Wound Bed Granulation Amount: Medium (34-66%) Exposed Structure Necrotic Amount: Medium (34-66%) Fascia Exposed: No Necrotic Quality: Eschar, Bone Fat Layer (Subcutaneous Tissue) Exposed: Yes Tendon Exposed: No Muscle Exposed: No Joint Exposed: No Bone Exposed: No Treatment Notes Wound #2 (Foot) Wound Laterality: Right, Circumferential Cleanser Normal Saline Discharge Instruction: Wash your hands with  soap and water. Remove old dressing, discard into plastic bag and place into trash. Cleanse the wound with Normal Saline prior to applying a clean dressing using gauze sponges, not tissues or cotton balls. Do not scrub or use excessive force. Pat dry using gauze sponges, not tissue or cotton balls. Soap and Water Discharge Instruction: Gently cleanse wound with antibacterial soap, rinse and pat dry prior to dressing wounds Peri-Wound Care Topical Gentamicin Discharge Instruction: Apply to open area of foot Primary Dressing AquacelAg Advantage Dressing, 4x5 (in/in) Discharge Instruction: Apply to wound as directed Saintil, [name] ([id number]) [id number] Page 10 of 10 Gauze Discharge Instruction: soak with Betadine and wrap around toes Secondary Dressing ABD Pad 5x9 (in/in) Discharge Instruction: Cover with ABD pad Secured With Medipore T - 27M Medipore H Soft Cloth Surgical T ape ape, 2x2 (in/yd) Kerlix Roll Sterile or Non-Sterile 6-ply 4.5x4 (yd/yd) Discharge Instruction: Apply Kerlix as directed Stretch Net Dressing, Latex-free, Size 5, Small-Head / Shoulder / Thigh Compression Wrap Compression Stockings Add-Ons Electronic Signature(s) Signed: 04/02/2022 9:10:22 AM By: Gretta Cool, BSN, RN, CWS, Kim RN, BSN Signed: 04/02/2022 4:49:02 PM By: Massie Kluver Entered By: Massie Kluver on 04/01/2022 11:26:09 -------------------------------------------------------------------------------- Vitals Details Patient Name: Date of Service: [place], [place] [name] M. 04/01/2022 11:00 A M Medical Record Number: [id number] Patient Account Number: [id number] Date of Birth/Sex: Treating RN: June 21, 1970 (52 y.o. [name] Primary Care Josue Falconi: Evern Bio Other Clinician: Massie Kluver Referring Kainon Varady: Treating Dashon Mcintire/Extender: Darien Ramus Weeks in Treatment: 45 Vital Signs Time Taken: 11:13 Temperature (F): 98.3 Pulse (bpm): 98 Respiratory Rate  (breaths/min): 18 Blood Pressure (mmHg): 109/68 Reference Range: 80 - 120 mg / dl Electronic Signature(s) Signed: 04/02/2022 4:49:02 PM By: Massie Kluver Entered By: Massie Kluver on 04/01/2022 11:15:14

## 2022-04-04 DIAGNOSIS — Z992 Dependence on renal dialysis: Secondary | ICD-10-CM | POA: Diagnosis not present

## 2022-04-04 DIAGNOSIS — M86171 Other acute osteomyelitis, right ankle and foot: Secondary | ICD-10-CM | POA: Diagnosis not present

## 2022-04-04 DIAGNOSIS — N186 End stage renal disease: Secondary | ICD-10-CM | POA: Diagnosis not present

## 2022-04-05 DIAGNOSIS — E1165 Type 2 diabetes mellitus with hyperglycemia: Secondary | ICD-10-CM | POA: Diagnosis not present

## 2022-04-05 DIAGNOSIS — Z7985 Long-term (current) use of injectable non-insulin antidiabetic drugs: Secondary | ICD-10-CM | POA: Diagnosis not present

## 2022-04-05 DIAGNOSIS — Z794 Long term (current) use of insulin: Secondary | ICD-10-CM | POA: Diagnosis not present

## 2022-04-05 DIAGNOSIS — L089 Local infection of the skin and subcutaneous tissue, unspecified: Secondary | ICD-10-CM | POA: Diagnosis not present

## 2022-04-05 DIAGNOSIS — I5032 Chronic diastolic (congestive) heart failure: Secondary | ICD-10-CM | POA: Diagnosis not present

## 2022-04-05 DIAGNOSIS — E1122 Type 2 diabetes mellitus with diabetic chronic kidney disease: Secondary | ICD-10-CM | POA: Diagnosis not present

## 2022-04-05 DIAGNOSIS — E1151 Type 2 diabetes mellitus with diabetic peripheral angiopathy without gangrene: Secondary | ICD-10-CM | POA: Diagnosis not present

## 2022-04-05 DIAGNOSIS — J9612 Chronic respiratory failure with hypercapnia: Secondary | ICD-10-CM | POA: Diagnosis not present

## 2022-04-05 DIAGNOSIS — L97513 Non-pressure chronic ulcer of other part of right foot with necrosis of muscle: Secondary | ICD-10-CM | POA: Diagnosis not present

## 2022-04-05 DIAGNOSIS — E872 Acidosis, unspecified: Secondary | ICD-10-CM | POA: Diagnosis not present

## 2022-04-05 DIAGNOSIS — E11621 Type 2 diabetes mellitus with foot ulcer: Secondary | ICD-10-CM | POA: Diagnosis not present

## 2022-04-05 DIAGNOSIS — N186 End stage renal disease: Secondary | ICD-10-CM | POA: Diagnosis not present

## 2022-04-05 DIAGNOSIS — M86371 Chronic multifocal osteomyelitis, right ankle and foot: Secondary | ICD-10-CM | POA: Diagnosis not present

## 2022-04-05 DIAGNOSIS — I132 Hypertensive heart and chronic kidney disease with heart failure and with stage 5 chronic kidney disease, or end stage renal disease: Secondary | ICD-10-CM | POA: Diagnosis not present

## 2022-04-05 DIAGNOSIS — Z992 Dependence on renal dialysis: Secondary | ICD-10-CM | POA: Diagnosis not present

## 2022-04-05 DIAGNOSIS — I951 Orthostatic hypotension: Secondary | ICD-10-CM | POA: Diagnosis not present

## 2022-04-05 DIAGNOSIS — J441 Chronic obstructive pulmonary disease with (acute) exacerbation: Secondary | ICD-10-CM | POA: Diagnosis not present

## 2022-04-05 DIAGNOSIS — J9611 Chronic respiratory failure with hypoxia: Secondary | ICD-10-CM | POA: Diagnosis not present

## 2022-04-05 DIAGNOSIS — D631 Anemia in chronic kidney disease: Secondary | ICD-10-CM | POA: Diagnosis not present

## 2022-04-05 DIAGNOSIS — E1169 Type 2 diabetes mellitus with other specified complication: Secondary | ICD-10-CM | POA: Diagnosis not present

## 2022-04-05 DIAGNOSIS — Z9981 Dependence on supplemental oxygen: Secondary | ICD-10-CM | POA: Diagnosis not present

## 2022-04-05 DIAGNOSIS — I70203 Unspecified atherosclerosis of native arteries of extremities, bilateral legs: Secondary | ICD-10-CM | POA: Diagnosis not present

## 2022-04-06 DIAGNOSIS — N186 End stage renal disease: Secondary | ICD-10-CM | POA: Diagnosis not present

## 2022-04-06 DIAGNOSIS — M86171 Other acute osteomyelitis, right ankle and foot: Secondary | ICD-10-CM | POA: Diagnosis not present

## 2022-04-06 DIAGNOSIS — Z992 Dependence on renal dialysis: Secondary | ICD-10-CM | POA: Diagnosis not present

## 2022-04-06 LAB — AEROBIC CULTURE W GRAM STAIN (SUPERFICIAL SPECIMEN)

## 2022-04-08 ENCOUNTER — Encounter: Payer: 59 | Admitting: Physician Assistant

## 2022-04-08 DIAGNOSIS — Z9981 Dependence on supplemental oxygen: Secondary | ICD-10-CM | POA: Diagnosis not present

## 2022-04-08 DIAGNOSIS — L97514 Non-pressure chronic ulcer of other part of right foot with necrosis of bone: Secondary | ICD-10-CM | POA: Diagnosis not present

## 2022-04-08 DIAGNOSIS — L97513 Non-pressure chronic ulcer of other part of right foot with necrosis of muscle: Secondary | ICD-10-CM | POA: Diagnosis not present

## 2022-04-08 DIAGNOSIS — Z992 Dependence on renal dialysis: Secondary | ICD-10-CM | POA: Diagnosis not present

## 2022-04-08 DIAGNOSIS — M1990 Unspecified osteoarthritis, unspecified site: Secondary | ICD-10-CM | POA: Diagnosis not present

## 2022-04-08 DIAGNOSIS — E1151 Type 2 diabetes mellitus with diabetic peripheral angiopathy without gangrene: Secondary | ICD-10-CM | POA: Diagnosis not present

## 2022-04-08 DIAGNOSIS — N186 End stage renal disease: Secondary | ICD-10-CM | POA: Diagnosis not present

## 2022-04-08 DIAGNOSIS — M86371 Chronic multifocal osteomyelitis, right ankle and foot: Secondary | ICD-10-CM | POA: Diagnosis not present

## 2022-04-08 DIAGNOSIS — I5042 Chronic combined systolic (congestive) and diastolic (congestive) heart failure: Secondary | ICD-10-CM | POA: Diagnosis not present

## 2022-04-08 DIAGNOSIS — I132 Hypertensive heart and chronic kidney disease with heart failure and with stage 5 chronic kidney disease, or end stage renal disease: Secondary | ICD-10-CM | POA: Diagnosis not present

## 2022-04-08 DIAGNOSIS — E1122 Type 2 diabetes mellitus with diabetic chronic kidney disease: Secondary | ICD-10-CM | POA: Diagnosis not present

## 2022-04-08 DIAGNOSIS — E11621 Type 2 diabetes mellitus with foot ulcer: Secondary | ICD-10-CM | POA: Diagnosis not present

## 2022-04-08 DIAGNOSIS — E1140 Type 2 diabetes mellitus with diabetic neuropathy, unspecified: Secondary | ICD-10-CM | POA: Diagnosis not present

## 2022-04-08 DIAGNOSIS — J449 Chronic obstructive pulmonary disease, unspecified: Secondary | ICD-10-CM | POA: Diagnosis not present

## 2022-04-08 NOTE — Progress Notes (Addendum)
[name] ([id number]) [id number] Page 1 of 10 Visit Report for 04/08/2022 Arrival Information Details Patient Name: Date of Service: [name] [name]. 04/08/2022 2:45 PM Medical Record Number: [id number] Patient Account Number: [id number] Date of Birth/Sex: Treating RN: Jun 06, 1970 (52 y.o. [name] Primary Care Harland Aguiniga: Evern Bio Other Clinician: Massie Kluver Referring Janus Vlcek: Treating Marshayla Mitschke/Extender: Darien Ramus Weeks in Treatment: 77 Visit Information History Since Last Visit All ordered tests and consults were completed: No Patient Arrived: Wheel Chair Added or deleted any medications: No Arrival Time: 14:55 Any new allergies or adverse reactions: No Transfer Assistance: EasyPivot Patient Lift Had a fall or experienced change in No Patient Identification Verified: Yes activities of daily living that may affect Secondary Verification Process Completed: Yes risk of falls: Patient Requires Transmission-Based Precautions: No Signs or symptoms of abuse/neglect since No Patient Has Alerts: No last visito Hospitalized since last visit: No Implantable device outside of the clinic No excluding cellular tissue based products placed in the center since last visit: Has Dressing in Place as Prescribed: Yes Has Footwear/Offloading in Place as Yes Prescribed: Left: Surgical Shoe with Pressure Relief Insole Pain Present Now: Yes Electronic Signature(s) Signed: 04/08/2022 4:54:55 PM By: Massie Kluver Entered By: Massie Kluver on 04/08/2022 15:04:44 -------------------------------------------------------------------------------- Clinic Level of Care Assessment Details Patient Name: Date of Service: [name] [name]. 04/08/2022 2:45 PM Medical Record Number: [id number] Patient Account Number: [id number] Date of Birth/Sex: Treating RN: May 01, 1970 (52 y.o. [name] Primary Care Jamisha Hoeschen: Evern Bio Other  Clinician: Massie Kluver Referring Kymorah Korf: Treating Manuelita Moxon/Extender: Darien Ramus Weeks in Treatment: 66 Clinic Level of Care Assessment Items TOOL 1 Quantity Score [name] ([id number]) [id number] Page 2 of 10 []$  - 0 Use when EandM and Procedure is performed on INITIAL visit ASSESSMENTS - Nursing Assessment / Reassessment []$  - 0 General Physical Exam (combine w/ comprehensive assessment (listed just below) when performed on new pt. evals) []$  - 0 Comprehensive Assessment (HX, ROS, Risk Assessments, Wounds Hx, etc.) ASSESSMENTS - Wound and Skin Assessment / Reassessment []$  - 0 Dermatologic / Skin Assessment (not related to wound area) ASSESSMENTS - Ostomy and/or Continence Assessment and Care []$  - 0 Incontinence Assessment and Management []$  - 0 Ostomy Care Assessment and Management (repouching, etc.) PROCESS - Coordination of Care []$  - 0 Simple Patient / Family Education for ongoing care []$  - 0 Complex (extensive) Patient / Family Education for ongoing care []$  - 0 Staff obtains Programmer, systems, Records, T Results / Process Orders est []$  - 0 Staff telephones HHA, Nursing Homes / Clarify orders / etc []$  - 0 Routine Transfer to another Facility (non-emergent condition) []$  - 0 Routine Hospital Admission (non-emergent condition) []$  - 0 New Admissions / Biomedical engineer / Ordering NPWT Apligraf, etc. , []$  - 0 Emergency Hospital Admission (emergent condition) PROCESS - Special Needs []$  - 0 Pediatric / Minor Patient Management []$  - 0 Isolation Patient Management []$  - 0 Hearing / Language / Visual special needs []$  - 0 Assessment of Community assistance (transportation, D/C planning, etc.) []$  - 0 Additional assistance / Altered mentation []$  - 0 Support Surface(s) Assessment (bed, cushion, seat, etc.) INTERVENTIONS - Miscellaneous []$  - 0 External ear exam []$  - 0 Patient Transfer (multiple staff / Civil Service fast streamer /  Similar devices) []$  - 0 Simple Staple / Suture removal (25 or less) []$  - 0 Complex Staple / Suture removal (26 or more) []$  - 0 Hypo/Hyperglycemic Management (do not check if billed separately) []$  -  0 Ankle / Brachial Index (ABI) - do not check if billed separately Has the patient been seen at the hospital within the last three years: Yes Total Score: 0 Level Of Care: ____ Electronic Signature(s) Signed: 04/08/2022 4:54:55 PM By: Massie Kluver Entered By: Massie Kluver on 04/08/2022 15:42:05 Encounter Discharge Information Details -------------------------------------------------------------------------------- [name] ([id number]) [id number] Page 3 of 10 Patient Name: Date of Service: [name] [name]. 04/08/2022 2:45 PM Medical Record Number: [id number] Patient Account Number: [id number] Date of Birth/Sex: Treating RN: 1970/11/26 (52 y.o. [name] Primary Care Konrad Hoak: Evern Bio Other Clinician: Massie Kluver Referring Honor Fairbank: Treating Aldora Perman/Extender: Darien Ramus Weeks in Treatment: 68 Encounter Discharge Information Items Post Procedure Vitals Discharge Condition: Stable Temperature (F): 98.4 Ambulatory Status: Wheelchair Pulse (bpm): 101 Discharge Destination: Home Respiratory Rate (breaths/min): 18 Transportation: Private Auto Blood Pressure (mmHg): 136/76 Accompanied By: husband Schedule Follow-up Appointment: Yes Clinical Summary of Care: Electronic Signature(s) Signed: 04/08/2022 4:54:55 PM By: Massie Kluver Entered By: Massie Kluver on 04/08/2022 16:19:43 -------------------------------------------------------------------------------- Lower Extremity Assessment Details Patient Name: Date of Service: [name] [name]. 04/08/2022 2:45 PM Medical Record Number: [id number] Patient Account Number: [id number] Date of Birth/Sex: Treating RN: 03-07-1970 (52 y.o. [name] Primary Care Zaylon Bossier:  Evern Bio Other Clinician: Massie Kluver Referring Coren Sagan: Treating Elsye Mccollister/Extender: Darien Ramus Weeks in Treatment: 78 Electronic Signature(s) Signed: 04/08/2022 4:54:55 PM By: Massie Kluver Signed: 04/12/2022 1:08:07 PM By: Gretta Cool, BSN, RN, CWS, Kim RN, BSN Entered By: Massie Kluver on 04/08/2022 15:20:01 -------------------------------------------------------------------------------- Multi Wound Chart Details Patient Name: Date of Service: [name] [name]. 04/08/2022 2:45 PM Medical Record Number: [id number] Patient Account Number: [id number] Date of Birth/Sex: Treating RN: December 06, 1970 (52 y.o. [name] Primary Care Nils Thor: Evern Bio Other Clinician: Massie Kluver Referring Mahari Strahm: Treating Deon Duer/Extender: Darien Ramus Weeks in Treatment: 38 Vital Signs Height(in): Pulse(bpm): 101 Weight(lbs): Blood Pressure(mmHg): 136/76 Redner, [name] ([id number]) [id number] Page 4 of 10 Body Mass Index(BMI): Temperature(F): 98.4 Respiratory Rate(breaths/min): 18 [2:Photos:] [N/A:N/A] Right, Circumferential Foot N/A N/A Wound Location: Gradually Appeared N/A N/A Wounding Event: Diabetic Wound/Ulcer of the Lower N/A N/A Primary Etiology: Extremity Cataracts, Anemia, Asthma, Chronic N/A N/A Comorbid History: Obstructive Pulmonary Disease (COPD), Congestive Heart Failure, Hypertension, Type II Diabetes, End Stage Renal Disease, Osteoarthritis, Neuropathy 10/15/2020 N/A N/A Date Acquired: 2 N/A N/A Weeks of Treatment: Open N/A N/A Wound Status: No N/A N/A Wound Recurrence: Yes N/A N/A Pending A mputation on Presentation: 12.8x8x0.2 N/A N/A Measurements L x W x D (cm) 80.425 N/A N/A A (cm) : rea 16.085 N/A N/A Volume (cm) : 24.10% N/A N/A % Reduction in A rea: 92.40% N/A N/A % Reduction in Volume: Grade 4 N/A N/A Classification: Medium N/A N/A Exudate A  mount: Serosanguineous N/A N/A Exudate Type: red, brown N/A N/A Exudate Color: Medium (34-66%) N/A N/A Granulation A mount: Medium (34-66%) N/A N/A Necrotic A mount: Eschar, Necrosis of Bone N/A N/A Necrotic Tissue: Fat Layer (Subcutaneous Tissue): Yes N/A N/A Exposed Structures: Fascia: No Tendon: No Muscle: No Joint: No Bone: No Treatment Notes Electronic Signature(s) Signed: 04/08/2022 4:54:55 PM By: Massie Kluver Entered By: Massie Kluver on 04/08/2022 15:20:06 Pehl, [name] ([id number]) [id number] Page 5 of 10 -------------------------------------------------------------------------------- Multi-Disciplinary Care Plan Details Patient Name: Date of Service: [name] [name]. 04/08/2022 2:45 PM Medical Record Number: [id number] Patient Account Number: [id number] Date of Birth/Sex: Treating RN: 22-Mar-1970 (52 y.o. [name] Primary Care Thetis Schwimmer: Evern Bio Other Clinician: Massie Kluver Referring Delsin Copen:  Treating Alida Greiner/Extender: Luane School, Chelsa Weeks in Treatment: 46 Active Inactive HBO Nursing Diagnoses: Anxiety related to feelings of confinement associated with the hyperbaric oxygen chamber Anxiety related to knowledge deficit of hyperbaric oxygen therapy and treatment procedures Discomfort related to temperature and humidity changes inside hyperbaric chamber Potential for barotraumas to ears, sinuses, teeth, and lungs or cerebral gas embolism related to changes in atmospheric pressure inside hyperbaric oxygen chamber Potential for oxygen toxicity seizures related to delivery of 100% oxygen at an increased atmospheric pressure Potential for pulmonary oxygen toxicity related to delivery of 100% oxygen at an increased atmospheric pressure Goals: Barotrauma will be prevented during HBO2 Date Initiated: 06/05/2021 T arget Resolution Date: 06/05/2021 Goal Status: Active Patient and/or family will be able to  state/discuss factors appropriate to the management of their disease process during treatment Date Initiated: 06/05/2021 T arget Resolution Date: 06/05/2021 Goal Status: Active Patient will tolerate the hyperbaric oxygen therapy treatment Date Initiated: 06/05/2021 T arget Resolution Date: 06/05/2021 Goal Status: Active Patient will tolerate the internal climate of the chamber Date Initiated: 06/05/2021 T arget Resolution Date: 06/05/2021 Goal Status: Active Patient/caregiver will verbalize understanding of HBO goals, rationale, procedures and potential hazards Date Initiated: 06/05/2021 T arget Resolution Date: 06/05/2021 Goal Status: Active Signs and symptoms of pulmonary oxygen toxicity will be recognized and promptly addressed Date Initiated: 06/05/2021 T arget Resolution Date: 06/05/2021 Goal Status: Active Signs and symptoms of seizure will be recognized and promptly addressed ; seizing patients will suffer no harm Date Initiated: 06/05/2021 T arget Resolution Date: 06/05/2021 Goal Status: Active Interventions: Administer a five (5) minute air break for patient if signs and symptoms of seizure appear and notify the hyperbaric physician Administer decongestants, per physician orders, prior to HBO2 Administer the correct therapeutic gas delivery based on the patients needs and limitations, per physician order Assess and provide for patients comfort related to the hyperbaric environment and equalization of middle ear Assess for signs and symptoms related to adverse events, including but not limited to confinement anxiety, pneumothorax, oxygen toxicity and baurotrauma Assess patient for any history of confinement anxiety Assess patient's knowledge and expectations regarding hyperbaric medicine and provide education related to the hyperbaric environment, goals of treatment and prevention of adverse events Implement protocols to decrease risk of pneumothorax in high risk  patients Notes: Osteomyelitis Nursing Diagnoses: Kunath, [name] ([id number]) [id number] Page 6 of 10 Infection: osteomyelitis Knowledge deficit related to disease process and management Potential for infection: osteomyelitis Goals: Diagnostic evaluation for osteomyelitis completed as ordered Date Initiated: 06/05/2021 Target Resolution Date: 06/05/2021 Goal Status: Active Patient/caregiver will verbalize understanding of disease process and disease management Date Initiated: 06/05/2021 Target Resolution Date: 06/05/2021 Goal Status: Active [name] osteomyelitis will resolve Date Initiated: 06/05/2021 Target Resolution Date: 06/05/2021 Goal Status: Active Signs and symptoms for osteomyelitis will be recognized and promptly addressed Date Initiated: 06/05/2021 Target Resolution Date: 06/05/2021 Goal Status: Active Interventions: Assess for signs and symptoms of osteomyelitis resolution every visit Provide education on osteomyelitis Notes: Wound/Skin Impairment Nursing Diagnoses: Impaired tissue integrity Knowledge deficit related to ulceration/compromised skin integrity Goals: Ulcer/skin breakdown will have a volume reduction of 30% by week 4 Date Initiated: 05/18/2021 Target Resolution Date: 06/15/2021 Goal Status: Active Ulcer/skin breakdown will have a volume reduction of 50% by week 8 Date Initiated: 05/18/2021 Target Resolution Date: 07/13/2021 Goal Status: Active Ulcer/skin breakdown will have a volume reduction of 80% by week 12 Date Initiated: 05/18/2021 Target Resolution Date: 08/10/2021 Goal Status: Active Ulcer/skin breakdown will heal within 14 weeks Date  Initiated: 05/18/2021 Target Resolution Date: 08/24/2021 Goal Status: Active Interventions: Assess patient/caregiver ability to obtain necessary supplies Assess patient/caregiver ability to perform ulcer/skin care regimen upon admission and as needed Assess ulceration(s) every  visit Provide education on ulcer and skin care Notes: Electronic Signature(s) Signed: 04/08/2022 4:54:55 PM By: Massie Kluver Signed: 04/12/2022 1:08:07 PM By: Gretta Cool, BSN, RN, CWS, Kim RN, BSN Entered By: Massie Kluver on 04/08/2022 16:18:36 -------------------------------------------------------------------------------- Pain Assessment Details Patient Name: Date of Service: [name] [name]. 04/08/2022 2:45 PM Medical Record Number: [id number] Patient Account Number: [id number] Date of Birth/Sex: Treating RN: 1970/12/26 (52 y.o. [address] [place], [place] ([id number]) [id number] Page 7 of 10 Primary Care Dehlia Kilner: Evern Bio Other Clinician: Massie Kluver Referring Cecily Lawhorne: Treating Janeshia Ciliberto/Extender: Darien Ramus Weeks in Treatment: 15 Active Problems Location of Pain Severity and Description of Pain Patient Has Paino Yes Site Locations Pain Location: Generalized Pain Duration of the Pain. Constant / Intermittento Constant Rate the pain. Current Pain Level: 6 Character of Pain Describe the Pain: Aching, Burning, Shooting, Stabbing Pain Management and Medication Current Pain Management: Medication: Yes Cold Application: No Rest: No Massage: No Activity: No T.E.N.S.: No Heat Application: No Leg drop or elevation: No Is the Current Pain Management Adequate: Inadequate How does your wound impact your activities of daily livingo Sleep: No Bathing: No Appetite: No Relationship With Others: No Bladder Continence: No Emotions: No Bowel Continence: No Work: No Toileting: No Drive: No Dressing: No Hobbies: No Electronic Signature(s) Signed: 04/08/2022 4:54:55 PM By: Massie Kluver Signed: 04/12/2022 1:08:07 PM By: Gretta Cool, BSN, RN, CWS, Kim RN, BSN Entered By: Massie Kluver on 04/08/2022 15:11:56 -------------------------------------------------------------------------------- Patient/Caregiver Education  Details Patient Name: Date of Service: [name] [name]. 2/12/2024andnbsp2:45 PM Medical Record Number: [id number] Patient Account Number: [id number] Date of Birth/Gender: Treating RN: Jul 05, 1970 (52 y.o. [name] Primary Care Physician: Evern Bio Other Clinician: Massie Kluver Referring Physician: Treating Physician/Extender: Darien Ramus Weeks in Treatment: [address], [place] ([id number]) [id number] Page 8 of 10 Education Assessment Education Provided To: Patient Education Topics Provided Wound/Skin Impairment: Handouts: Other: continue wound care as directed Methods: Explain/Verbal Responses: State content correctly Electronic Signature(s) Signed: 04/08/2022 4:54:55 PM By: Massie Kluver Entered By: Massie Kluver on 04/08/2022 16:18:31 -------------------------------------------------------------------------------- Wound Assessment Details Patient Name: Date of Service: [name] [name]. 04/08/2022 2:45 PM Medical Record Number: [id number] Patient Account Number: [id number] Date of Birth/Sex: Treating RN: 1970-05-01 (52 y.o. [name] Primary Care Gabriela Irigoyen: Evern Bio Other Clinician: Massie Kluver Referring Toleen Lachapelle: Treating Natthew Marlatt/Extender: Darien Ramus Weeks in Treatment: 46 Wound Status Wound Number: 2 Primary Diabetic Wound/Ulcer of the Lower Extremity Etiology: Wound Location: Right, Circumferential Foot Wound Open Wounding Event: Gradually Appeared Status: Date Acquired: 10/15/2020 Notes: open areas to midline right foot, lateral right foot, and top of right Weeks Of Treatment: 46 foot Clustered Wound: No Comorbid Cataracts, Anemia, Asthma, Chronic Obstructive Pulmonary Pending Amputation On Presentation History: Disease (COPD), Congestive Heart Failure, Hypertension, Type II Diabetes, End Stage Renal Disease, Osteoarthritis, Neuropathy Photos Wound Measurements Length: (cm)  12.8 Width: (cm) 8 Depth: (cm) 0.2 Area: (cm) 80.425 Volume: (cm) 16.085 % Reduction in Area: 24.1% % Reduction in Volume: 92.4% Wound Description Classification: Grade 4 [name], [name] ([id number]) Exudate Amount: Medium Exudate Type: Serosanguineous Exudate Color: red, brown Foul Odor After Cleansing: No [id number] Page 9 of 10 Slough/Fibrino Yes Wound Bed Granulation Amount: Medium (34-66%) Exposed Structure Necrotic Amount: Medium (34-66%) Fascia Exposed: No Necrotic Quality: Eschar, Bone Fat  Layer (Subcutaneous Tissue) Exposed: Yes Tendon Exposed: No Muscle Exposed: No Joint Exposed: No Bone Exposed: No Treatment Notes Wound #2 (Foot) Wound Laterality: Right, Circumferential Cleanser Peri-Wound Care Topical Primary Dressing Secondary Dressing Secured With Compression Wrap Compression Stockings Add-Ons Electronic Signature(s) Signed: 04/08/2022 4:54:55 PM By: Massie Kluver Signed: 04/12/2022 1:08:07 PM By: Gretta Cool, BSN, RN, CWS, Kim RN, BSN Entered By: Massie Kluver on 04/08/2022 15:19:47 -------------------------------------------------------------------------------- Vitals Details Patient Name: Date of Service: [name] [name]. 04/08/2022 2:45 PM Medical Record Number: [id number] Patient Account Number: [id number] Date of Birth/Sex: Treating RN: 1970/11/15 (52 y.o. [name] Primary Care Jaymien Landin: Evern Bio Other Clinician: Massie Kluver Referring Jahzion Brogden: Treating Maggie Senseney/Extender: Darien Ramus Weeks in Treatment: 25 Vital Signs Time Taken: 15:05 Temperature (F): 98.4 Pulse (bpm): 101 Respiratory Rate (breaths/min): 18 Blood Pressure (mmHg): 136/76 Reference Range: 80 - 120 mg / dl Airway Pulse Oximetry (%): 92 Inhaled Oxygen Concentration (%): 4 Electronic Signature(s) Signed: 04/08/2022 4:54:55 PM By: Massie Kluver [name], Signed: 04/08/2022 4:54:55 PM By: Edman Circle  ([id number]) [id number] Page 10 of 10 Entered By: Massie Kluver on 04/08/2022 15:11:52

## 2022-04-08 NOTE — Progress Notes (Addendum)
[place], [name] ([id number]) [id number] Page 1 of 10 Visit Report for 04/08/2022 Chief Complaint Document Details Patient Name: Date of Service: [place], [name] 04/08/2022 2:45 PM Medical Record Number: [id number] Patient Account Number: [id number] Date of Birth/Sex: Treating RN: 02-22-1971 (52 y.o. [name] Primary Care Provider: Evern Bio Other Clinician: Massie Kluver Referring Provider: Treating Provider/Extender: Darien Ramus Weeks in Treatment: 88 Information Obtained from: Patient Chief Complaint Right foot gangrene and osteomyelitis Electronic Signature(s) Signed: 04/08/2022 2:47:45 PM By: Worthy Keeler PA-C Entered By: Worthy Keeler on 04/08/2022 14:47:45 -------------------------------------------------------------------------------- Debridement Details Patient Name: Date of Service: [place], [name] 04/08/2022 2:45 PM Medical Record Number: [id number] Patient Account Number: [id number] Date of Birth/Sex: Treating RN: 1971-02-09 (52 y.o. [name] Primary Care Provider: Evern Bio Other Clinician: Massie Kluver Referring Provider: Treating Provider/Extender: Darien Ramus Weeks in Treatment: 46 Debridement Performed for Assessment: Wound #2 Right,Circumferential Foot Performed By: Physician Tommie Sams., PA-C Debridement Type: Debridement Severity of Tissue Pre Debridement: Necrosis of muscle Level of Consciousness (Pre-procedure): Awake and Alert Pre-procedure Verification/Time Out Yes - 15:38 Taken: Start Time: 15:38 T Area Debrided (L x W): otal 2 (cm) x 2 (cm) = 4 (cm) Tissue and other material debrided: Viable, Non-Viable, Callus, Slough, Subcutaneous, Slough Level: Skin/Subcutaneous Tissue Debridement Description: Excisional Instrument: Forceps, Scissors Bleeding: Minimum Hemostasis Achieved: Pressure Response to Treatment: Procedure was tolerated well Level of  Consciousness (Post- Awake and Alert procedure): [place], [name] ([id number]) [id number] Page 2 of 10 Post Debridement Measurements of Total Wound Length: (cm) 12.8 Width: (cm) 8 Depth: (cm) 0.2 Volume: (cm) 16.085 Character of Wound/Ulcer Post Debridement: Stable Severity of Tissue Post Debridement: Necrosis of muscle Post Procedure Diagnosis Same as Pre-procedure Electronic Signature(s) Signed: 04/08/2022 3:56:01 PM By: Worthy Keeler PA-C Signed: 04/12/2022 1:08:07 PM By: Gretta Cool, BSN, RN, CWS, Kim RN, BSN Entered By: Worthy Keeler on 04/08/2022 15:56:01 -------------------------------------------------------------------------------- HPI Details Patient Name: Date of Service: [place], [name] 04/08/2022 2:45 PM Medical Record Number: [id number] Patient Account Number: [id number] Date of Birth/Sex: Treating RN: February 17, 1971 (52 y.o. [name] Primary Care Provider: Evern Bio Other Clinician: Massie Kluver Referring Provider: Treating Provider/Extender: Darien Ramus Weeks in Treatment: 7 History of Present Illness HPI Description: 10/04/2020 upon evaluation today patient presents for initial inspection here in the clinic concerning a fairly concerning wound over her right fifth toe towards the medial aspect which actually probes all the way down to bone. There is evidence of necrotic muscle as well and I do see tendon exposed as well. Subsequently this does have me very concerned about where things stand here. Again this is the first time that I am seeing her and on top of everything else her ABIs are noncompressible. I think that arterial flow is the primary concern here. Of note the patient states this was first noted July 1. She had a x-ray on July 4 which was negative for bone infection. She has been on 2 antibiotics she is unsure of the name. With that being said her most recent hemoglobin A1c was 8.1 that was December 2022.  She otherwise does have a history obviously of diabetes mellitus type 2, hypertension, end-stage renal disease with dependence on renal dialysis, and she is also dependent on supplemental oxygen. Readmission: 05/14/2021 this is a patient whom I previously seen October 04, 2020. With that being said she is subsequently on 21 October 2020 ended up having an amputation of her fifth  digit of her foot unfortunately. This had progressed fairly quickly into what sounds to been gangrene at that time. Subsequently right now based on what I am seeing the patient actually has a necrotic area on the end of her foot and has been recommended that the only option really here is going to be a amputation which would be a below-knee amputation. She is really not interested in that and wants to try to give this every chance it can to heal. For that reason I discussed with her that definitely we can see what we can do to try to improve this situation and try to help get this doing better as best we can. With that being said there is definitely a portion of her wound that is just not good to be able to heal no matter what it is already starting to auto amputate. This includes potentially digits of her foot as well based on what I am seeing. She does have confirmed osteomyelitis noted by MRI. She is also currently on IV vancomycin. She does undergo dialysis on Tuesday, Thursday, and Saturday. The patient does have a history of diabetes mellitus type 2, congestive heart failure, hypertension, end-stage renal disease, dependence on renal dialysis, and dependence on supplemental oxygen. With that being said I am going to need to look into her heart failure and the significance of this as far as hyperbarics is concerned before we initiate treatment. She is probably also going to need an updated EKG and chest x-ray unless its already been done recently. 05/28/2021 upon evaluation today patient appears to be doing well with regard to  her wound all things considered. I am going to try to see what we can do about trimmed away some of the necrotic tissue that is really just hanging on here and there and not really helpful. Feel like if we can slowly start doing this maybe we will be able to get to the point where this is better and better as far as cleaning up the surface of the wound is concerned. Still as I explained I do believe some of her toes are not salvageable but again she is looking more towards autoamputation versus want to do anything definitive here from a surgical standpoint. I have been researching whether she would be an okay candidate for hyperbarics or not and I found that her x-ray but she had a chest x-ray recently was negative for any acute disease and appear to be stable. EKG was also stable. She did also have a cardiac echo which showed that her ejection fraction was 60 to 65% and therefore I think she is a candidate for hyperbarics. Again I do believe that this can help with preventing amputation which right now she has been told that her only option would be a below-knee amputation. Nonetheless I think that if we can get the heel leading to spread and the majority of this cleared away that it is possible we might even be able to get a surgeon just to surgically remove the nonviable toes and not have to go the full way of complete amputation. Nonetheless we do have a ways to go before we will get to that point. Either way I think the hyperbarics could be an excellent support as far as that is concerned. 06-05-2021 upon evaluation today patient's wound is showing some signs of improvement. This does seem to be slow and of course it is understandable considering what we are seeing. She has discussed and looked up  information about hyperbarics and in the end comes back today and states that she does want to proceed. I honestly do feel like that is her best chance at saving the foot. Even if we are able to get her  into a surgeon after we obtain some good [place], [name] ([id number]) [id number] Page 3 of 10 healing approximately so that there is not as much necrotic tissue in general she may even be able to have a transmetatarsal amputation and get this to heal. Again I am not saying this is can be an easy road but it may be her best road as far as thinking about how to achieve some type of complete healing as I do not believe the toes are viable at all to be honest. 07-06-2021 upon evaluation today patient appears to be doing well currently in regard to her foot all things considered. Again she unfortunately was unable to do the hyperbaric oxygen therapy this was due to her claustrophobia. Nonetheless she tells me that she is pleased with how the foot appears and she does feel like there is good improvement going on here. In general I think that we are on the right track. If she is going to take a very long time and to be honest the route of autoamputation which is pretty much the way this is going is not a quick or easy road but nonetheless she seems to be taking care of her foot quite well. She is in good spirits. 07-27-2021 upon evaluation today patient appears to be doing well currently in regard to her foot all things considered. A lot of the eschar is starting to lift up around the edges which is good there is still significant necrotic tissue on the distal portion of the foot. This is slowly going to work its way off. Based on what I am seeing currently I do not believe that there is any evidence of infection which is good news. 08-31-2021 upon evaluation today patient's wound actually appears to be doing decently well. I do not see anything in particular to try to trim away today. With that being said she continues to slowly allow this to do what is going to do as far as autoamputation is concerned. I did have another conversation with her today about the possibility of seeing a  surgeon for a transmetatarsal amputation which again would help to get this to close faster and obviously I think would be a much speedy year and safer way to proceed with amputation. With that being said she tells me that she would really prefer to just let this go at "God pace". 09-28-2021 upon evaluation today patient's foot actually appears to be doing much better. Fortunately I do not see any signs of infection which is great news and overall I am extremely pleased with where we stand today. There does not appear to be any signs of active infection locally or systemically which is great news. No fevers, chills, nausea, vomiting, or diarrhea. 10-26-2021 upon evaluation today patient appears to be doing well currently in regard to her foot all things considered. Fortunately I do not see any evidence of active infection locally or systemically at this time which is great news. No fevers, chills, nausea, vomiting, or diarrhea. She does have some of the eschar that is lifting up even a little bit more compared to last time I saw her. 11-30-2021 upon evaluation today patient's wound is still continuing to have some areas that lift up although there  is nothing really that I feel like I could trim away too much today to be honest. Fortunately I do not see any evidence of systemic infection though locally she has been concerned about some drainage I do not see anything that seems to be actively red or erythematous or warm to touch which is good news. No fevers, chills, nausea, vomiting, or diarrhea. 12-21-2021 upon evaluation patient's foot ulcer actually is showing signs of some improvement with healthier tissue noted at areas with eschar that keeps clearing off. There is a bit of the eschar that is loosening up today as well and I think we will get a be able to get some of this cleared away that we have been working on for a while. Fortunately I do not see any evidence of infection locally or systemically at  this time which is great news. No fevers, chills, nausea, vomiting, or diarrhea. 01-11-2022 upon evaluation today patient appears to be doing decently well in regard to her foot she still has a necrotic area on the tip of her foot but she is tolerating the dressing changes without complication. Fortunately there does not appear to be any signs of infection locally or systemically at this time which is great news. No fevers, chills, nausea, vomiting, or diarrhea. 02-01-2022 upon evaluation today patient continues to show signs of improvement in regard to the area where we have been able to remove the eschar. Unfortunately the tip of the foot I think is the part that is completely dead and not going to be coming back as far as any healing is concerned. I have discussed this with the patient multiple times however she does not want to have any type of surgical intervention. Obviously in that regard we will try to do what we can as best we can. 02-15-2022 upon evaluation today patient appears to be doing well with regard to her foot. She is actually showing some signs of improvement she has had some drainage but nothing appears to be doing too poorly overall. I do not see any signs of infection and I am pleased in that regard. Fortunately I do not see any evidence of infection locally nor systemically which is great news. 03-01-2022 upon evaluation today patient appears to be doing well currently in regard to her wounds. She has been tolerating the dressing changes without complication. Fortunately I do not see any signs of active infection locally nor systemically which is great news. No fevers, chills, nausea, vomiting, or diarrhea. 03-15-2022 upon evaluation today patient's wound continues to show signs of loosening the splint some of the eschar is concerned on the tip of her foot. Fortunately I do not see any evidence of infection systemically which is good news locally I feel like she is making good  progress here. Overall I am extremely happy with where things stand. Especially considering the fact that really she does need to have an amputation of the necrotic portion of the end of her foot. Again if we got rid of those toes I do think that this could actually heal much more effectively and quickly with that being said she is completely against going down that road so we will try to do what we can as conservatively as we can while still making some progress here. 04-01-2022 upon evaluation today patient's foot unfortunately does not appear to be doing as well as I would like to see. I discussed with her today that I do believe she probably needs to be very  mindful of this and how things are progressing. I explained as well that this is what I been most worried about the whole time that I been seeing her since really at least March of last year when I started seeing her again. Subsequently she notes that she has had more drainage also feel that the drainage is more discolored which is not good. 04-08-2022 upon evaluation today patient appears to be doing well currently in regard to her foot ulcer this is much better than last week's evaluation. Fortunately I do not see any signs of active infection systemically though locally we still see some drainage I think this is much better with the doxycycline she has been taking that since we got the results back of the culture nothing has really changed in that regard. Fortunately I think that we are on the right track. Electronic Signature(s) Signed: 04/08/2022 3:54:25 PM By: Worthy Keeler PA-C Entered By: Worthy Keeler on 04/08/2022 15:54:25 [place], [name] ([id number]) [id number] Page 4 of 10 -------------------------------------------------------------------------------- Physical Exam Details Patient Name: Date of Service: [place], [name] 04/08/2022 2:45 PM Medical Record Number: [id number] Patient Account Number:  [id number] Date of Birth/Sex: Treating RN: [birth year] (52 y.o. [name] Primary Care Provider: Evern Bio Other Clinician: Massie Kluver Referring Provider: Treating Provider/Extender: Darien Ramus Weeks in Treatment: 15 Constitutional Well-nourished and well-hydrated in no acute distress. Respiratory normal breathing without difficulty. Psychiatric this patient is able to make decisions and demonstrates good insight into disease process. Alert and Oriented x 3. pleasant and cooperative. Notes [name] wound currently still has some purulent drainage though it does seem to be much less the last week's evaluation was still using gentamicin topically over the open area and then subsequently using the Aquacel as well as the oral antibiotic, doxycycline. Electronic Signature(s) Signed: 04/08/2022 3:54:45 PM By: Worthy Keeler PA-C Entered By: Worthy Keeler on 04/08/2022 15:54:45 -------------------------------------------------------------------------------- Physician Orders Details Patient Name: Date of Service: [place], [name] 04/08/2022 2:45 PM Medical Record Number: [id number] Patient Account Number: [id number] Date of Birth/Sex: Treating RN: 1970-08-29 (52 y.o. [name] Primary Care Provider: Evern Bio Other Clinician: Massie Kluver Referring Provider: Treating Provider/Extender: Darien Ramus Weeks in Treatment: 45 Verbal / Phone Orders: No Diagnosis Coding ICD-10 Coding Code Description M86.371 Chronic multifocal osteomyelitis, right ankle and foot E11.621 Type 2 diabetes mellitus with foot ulcer L97.514 Non-pressure chronic ulcer of other part of right foot with necrosis of bone I50.42 Chronic combined systolic (congestive) and diastolic (congestive) heart failure I10 Essential (primary) hypertension N18.6 End stage renal disease Z99.2 Dependence on renal dialysis Z99.81 Dependence on supplemental oxygen Follow-up  Appointments Return Appointment in 3 weeks. Nurse Visit as needed [place] for wound care. May utilize formulary equivalent dressing for wound treatment orders unless otherwise specified. Home Health Nurse may visit PRN to address patients wound care needs. Scheduled days for dressing changes to be completed; exception, patient has scheduled wound care visit that day. **Please direct any NON-WOUND related issues/requests for orders to patient's Primary Care Physician. **If current dressing causes [place], [name] ([id number]) [id number] Page 5 of 10 regression in wound condition, may D/C ordered dressing product/s and apply Normal Saline Moist Dressing daily until next [place] or Other MD appointment. **Notify [facility] of regression in wound condition at [phone]. Bathing/ Shower/ Hygiene Clean wound with Normal Saline or wound cleanser. Wash wounds with antibacterial soap  and water. May shower; gently cleanse wound with antibacterial soap, rinse and pat dry prior to dressing wounds No tub bath. Off-Loading Open toe surgical shoe Medications-Please add to medication list. ntibiotics - continue Doxycycline P.O. A ntibiotic - gentamycin to open area of foot Topical A Wound Treatment Wound #2 - Foot Wound Laterality: Right, Circumferential Cleanser: Normal Saline 1 x Per Day/30 Days Discharge Instructions: Wash your hands with soap and water. Remove old dressing, discard into plastic bag and place into trash. Cleanse the wound with Normal Saline prior to applying a clean dressing using gauze sponges, not tissues or cotton balls. Do not scrub or use excessive force. Pat dry using gauze sponges, not tissue or cotton balls. Cleanser: Soap and Water 1 x Per Day/30 Days Discharge Instructions: Gently cleanse wound with antibacterial soap, rinse and pat dry prior to dressing  wounds Topical: Gentamicin 1 x Per Day/30 Days Discharge Instructions: Apply to open area of foot Prim Dressing: AquacelAg Advantage Dressing, 4x5 (in/in) (Dispense As Written) 1 x Per Day/30 Days ary Discharge Instructions: Apply to wound as directed Prim Dressing: Gauze 1 x Per Day/30 Days ary Discharge Instructions: soak with Betadine and wrap around toes Secondary Dressing: ABD Pad 5x9 (in/in) 1 x Per Day/30 Days Discharge Instructions: Cover with ABD pad Secured With: Medipore T - 67M Medipore H Soft Cloth Surgical T ape ape, 2x2 (in/yd) 1 x Per Day/30 Days Secured With: Kerlix Roll Sterile or Non-Sterile 6-ply 4.5x4 (yd/yd) 1 x Per Day/30 Days Discharge Instructions: Apply Kerlix as directed Secured With: Stretch Net Dressing, Latex-free, Size 5, Small-Head / Shoulder / Thigh 1 x Per Day/30 Days Electronic Signature(s) Signed: 04/08/2022 4:54:55 PM By: Massie Kluver Signed: 04/08/2022 5:25:48 PM By: Worthy Keeler PA-C Entered By: Massie Kluver on 04/08/2022 15:41:59 -------------------------------------------------------------------------------- Problem List Details Patient Name: Date of Service: [place], [name] 04/08/2022 2:45 PM Medical Record Number: [id number] Patient Account Number: [id number] Date of Birth/Sex: Treating RN: 08/16/1970 (52 y.o. [name] Primary Care Provider: Evern Bio Other Clinician: Massie Kluver Referring Provider: Treating Provider/Extender: Darien Ramus Weeks in Treatment: [address] Problems [place], [name] ([id number]) [id number] Page 6 of 10 ICD-10 Encounter Code Description Active Date MDM Diagnosis M86.371 Chronic multifocal osteomyelitis, right ankle and foot 05/18/2021 No Yes E11.621 Type 2 diabetes mellitus with foot ulcer 05/18/2021 No Yes L97.514 Non-pressure chronic ulcer of other part of right foot with necrosis of bone 05/18/2021 No Yes I50.42 Chronic combined systolic  (congestive) and diastolic (congestive) heart failure 05/18/2021 No Yes I10 Essential (primary) hypertension 05/18/2021 No Yes N18.6 End stage renal disease 05/18/2021 No Yes Z99.2 Dependence on renal dialysis 05/18/2021 No Yes Z99.81 Dependence on supplemental oxygen 05/18/2021 No Yes Inactive Problems Resolved Problems Electronic Signature(s) Signed: 04/08/2022 2:47:41 PM By: Worthy Keeler PA-C Entered By: Worthy Keeler on 04/08/2022 14:47:41 -------------------------------------------------------------------------------- Progress Note Details Patient Name: Date of Service: [place], [name] 04/08/2022 2:45 PM Medical Record Number: [id number] Patient Account Number: [id number] Date of Birth/Sex: Treating RN: 11-Feb-1971 (52 y.o. [name] Primary Care Provider: Evern Bio Other Clinician: Massie Kluver Referring Provider: Treating Provider/Extender: Darien Ramus Weeks in Treatment: 66 Subjective Chief Complaint Information obtained from Patient Right foot gangrene and osteomyelitis History of Present Illness (HPI) 10/04/2020 upon evaluation today patient presents for initial inspection here in the clinic concerning a fairly concerning wound over her right fifth toe towards the [place], [name] ([id number]) [id number] Page 7 of 10 medial aspect which actually probes  all the way down to bone. There is evidence of necrotic muscle as well and I do see tendon exposed as well. Subsequently this does have me very concerned about where things stand here. Again this is the first time that I am seeing her and on top of everything else her ABIs are noncompressible. I think that arterial flow is the primary concern here. Of note the patient states this was first noted July 1. She had a x-ray on July 4 which was negative for bone infection. She has been on 2 antibiotics she is unsure of the name. With that being said her most recent hemoglobin A1c  was 8.1 that was December 2022. She otherwise does have a history obviously of diabetes mellitus type 2, hypertension, end-stage renal disease with dependence on renal dialysis, and she is also dependent on supplemental oxygen. Readmission: 05/14/2021 this is a patient whom I previously seen October 04, 2020. With that being said she is subsequently on 21 October 2020 ended up having an amputation of her fifth digit of her foot unfortunately. This had progressed fairly quickly into what sounds to been gangrene at that time. Subsequently right now based on what I am seeing the patient actually has a necrotic area on the end of her foot and has been recommended that the only option really here is going to be a amputation which would be a below-knee amputation. She is really not interested in that and wants to try to give this every chance it can to heal. For that reason I discussed with her that definitely we can see what we can do to try to improve this situation and try to help get this doing better as best we can. With that being said there is definitely a portion of her wound that is just not good to be able to heal no matter what it is already starting to auto amputate. This includes potentially digits of her foot as well based on what I am seeing. She does have confirmed osteomyelitis noted by MRI. She is also currently on IV vancomycin. She does undergo dialysis on Tuesday, Thursday, and Saturday. The patient does have a history of diabetes mellitus type 2, congestive heart failure, hypertension, end-stage renal disease, dependence on renal dialysis, and dependence on supplemental oxygen. With that being said I am going to need to look into her heart failure and the significance of this as far as hyperbarics is concerned before we initiate treatment. She is probably also going to need an updated EKG and chest x-ray unless its already been done recently. 05/28/2021 upon evaluation today patient appears  to be doing well with regard to her wound all things considered. I am going to try to see what we can do about trimmed away some of the necrotic tissue that is really just hanging on here and there and not really helpful. Feel like if we can slowly start doing this maybe we will be able to get to the point where this is better and better as far as cleaning up the surface of the wound is concerned. Still as I explained I do believe some of her toes are not salvageable but again she is looking more towards autoamputation versus want to do anything definitive here from a surgical standpoint. I have been researching whether she would be an okay candidate for hyperbarics or not and I found that her x-ray but she had a chest x-ray recently was negative for any acute disease and appear to  be stable. EKG was also stable. She did also have a cardiac echo which showed that her ejection fraction was 60 to 65% and therefore I think she is a candidate for hyperbarics. Again I do believe that this can help with preventing amputation which right now she has been told that her only option would be a below-knee amputation. Nonetheless I think that if we can get the heel leading to spread and the majority of this cleared away that it is possible we might even be able to get a surgeon just to surgically remove the nonviable toes and not have to go the full way of complete amputation. Nonetheless we do have a ways to go before we will get to that point. Either way I think the hyperbarics could be an excellent support as far as that is concerned. 06-05-2021 upon evaluation today patient's wound is showing some signs of improvement. This does seem to be slow and of course it is understandable considering what we are seeing. She has discussed and looked up information about hyperbarics and in the end comes back today and states that she does want to proceed. I honestly do feel like that is her best chance at saving the foot.  Even if we are able to get her into a surgeon after we obtain some good healing approximately so that there is not as much necrotic tissue in general she may even be able to have a transmetatarsal amputation and get this to heal. Again I am not saying this is can be an easy road but it may be her best road as far as thinking about how to achieve some type of complete healing as I do not believe the toes are viable at all to be honest. 07-06-2021 upon evaluation today patient appears to be doing well currently in regard to her foot all things considered. Again she unfortunately was unable to do the hyperbaric oxygen therapy this was due to her claustrophobia. Nonetheless she tells me that she is pleased with how the foot appears and she does feel like there is good improvement going on here. In general I think that we are on the right track. If she is going to take a very long time and to be honest the route of autoamputation which is pretty much the way this is going is not a quick or easy road but nonetheless she seems to be taking care of her foot quite well. She is in good spirits. 07-27-2021 upon evaluation today patient appears to be doing well currently in regard to her foot all things considered. A lot of the eschar is starting to lift up around the edges which is good there is still significant necrotic tissue on the distal portion of the foot. This is slowly going to work its way off. Based on what I am seeing currently I do not believe that there is any evidence of infection which is good news. 08-31-2021 upon evaluation today patient's wound actually appears to be doing decently well. I do not see anything in particular to try to trim away today. With that being said she continues to slowly allow this to do what is going to do as far as autoamputation is concerned. I did have another conversation with her today about the possibility of seeing a surgeon for a transmetatarsal amputation which again  would help to get this to close faster and obviously I think would be a much speedy year and safer way to proceed with amputation.  With that being said she tells me that she would really prefer to just let this go at "God pace". 09-28-2021 upon evaluation today patient's foot actually appears to be doing much better. Fortunately I do not see any signs of infection which is great news and overall I am extremely pleased with where we stand today. There does not appear to be any signs of active infection locally or systemically which is great news. No fevers, chills, nausea, vomiting, or diarrhea. 10-26-2021 upon evaluation today patient appears to be doing well currently in regard to her foot all things considered. Fortunately I do not see any evidence of active infection locally or systemically at this time which is great news. No fevers, chills, nausea, vomiting, or diarrhea. She does have some of the eschar that is lifting up even a little bit more compared to last time I saw her. 11-30-2021 upon evaluation today patient's wound is still continuing to have some areas that lift up although there is nothing really that I feel like I could trim away too much today to be honest. Fortunately I do not see any evidence of systemic infection though locally she has been concerned about some drainage I do not see anything that seems to be actively red or erythematous or warm to touch which is good news. No fevers, chills, nausea, vomiting, or diarrhea. 12-21-2021 upon evaluation patient's foot ulcer actually is showing signs of some improvement with healthier tissue noted at areas with eschar that keeps clearing off. There is a bit of the eschar that is loosening up today as well and I think we will get a be able to get some of this cleared away that we have been working on for a while. Fortunately I do not see any evidence of infection locally or systemically at this time which is great news. No fevers, chills,  nausea, vomiting, or diarrhea. 01-11-2022 upon evaluation today patient appears to be doing decently well in regard to her foot she still has a necrotic area on the tip of her foot but she is tolerating the dressing changes without complication. Fortunately there does not appear to be any signs of infection locally or systemically at this time which is great news. No fevers, chills, nausea, vomiting, or diarrhea. 02-01-2022 upon evaluation today patient continues to show signs of improvement in regard to the area where we have been able to remove the eschar. Unfortunately the tip of the foot I think is the part that is completely dead and not going to be coming back as far as any healing is concerned. I have discussed this with the patient multiple times however she does not want to have any type of surgical intervention. Obviously in that regard we will try to do what we can as best we can. 02-15-2022 upon evaluation today patient appears to be doing well with regard to her foot. She is actually showing some signs of improvement she has had some drainage but nothing appears to be doing too poorly overall. I do not see any signs of infection and I am pleased in that regard. Fortunately I do not see any evidence of infection locally nor systemically which is great news. 03-01-2022 upon evaluation today patient appears to be doing well currently in regard to her wounds. She has been tolerating the dressing changes without complication. Fortunately I do not see any signs of active infection locally nor systemically which is great news. No fevers, chills, nausea, vomiting, or diarrhea. Hunley, Avonna M (  [id number]) [id number] Page 8 of 10 03-15-2022 upon evaluation today patient's wound continues to show signs of loosening the splint some of the eschar is concerned on the tip of her foot. Fortunately I do not see any evidence of infection systemically which is good news locally I  feel like she is making good progress here. Overall I am extremely happy with where things stand. Especially considering the fact that really she does need to have an amputation of the necrotic portion of the end of her foot. Again if we got rid of those toes I do think that this could actually heal much more effectively and quickly with that being said she is completely against going down that road so we will try to do what we can as conservatively as we can while still making some progress here. 04-01-2022 upon evaluation today patient's foot unfortunately does not appear to be doing as well as I would like to see. I discussed with her today that I do believe she probably needs to be very mindful of this and how things are progressing. I explained as well that this is what I been most worried about the whole time that I been seeing her since really at least March of last year when I started seeing her again. Subsequently she notes that she has had more drainage also feel that the drainage is more discolored which is not good. 04-08-2022 upon evaluation today patient appears to be doing well currently in regard to her foot ulcer this is much better than last week's evaluation. Fortunately I do not see any signs of active infection systemically though locally we still see some drainage I think this is much better with the doxycycline she has been taking that since we got the results back of the culture nothing has really changed in that regard. Fortunately I think that we are on the right track. Objective Constitutional Well-nourished and well-hydrated in no acute distress. Vitals Time Taken: 3:05 PM, Temperature: 98.4 F, Pulse: 101 bpm, Respiratory Rate: 18 breaths/min, Blood Pressure: 136/76 mmHg, Pulse Oximetry: 92 %. Respiratory normal breathing without difficulty. Psychiatric this patient is able to make decisions and demonstrates good insight into disease process. Alert and Oriented x 3.  pleasant and cooperative. General Notes: [name] wound currently still has some purulent drainage though it does seem to be much less the last week's evaluation was still using gentamicin topically over the open area and then subsequently using the Aquacel as well as the oral antibiotic, doxycycline. Integumentary (Hair, Skin) Wound #2 status is Open. Original cause of wound was Gradually Appeared. The date acquired was: 10/15/2020. The wound has been in treatment 46 weeks. The wound is located on the Right,Circumferential Foot. The wound measures 12.8cm length x 8cm width x 0.2cm depth; 80.425cm^2 area and 16.085cm^3 volume. There is Fat Layer (Subcutaneous Tissue) exposed. There is a medium amount of serosanguineous drainage noted. There is medium (34-66%) granulation within the wound bed. There is a medium (34-66%) amount of necrotic tissue within the wound bed including Eschar and Necrosis of Bone. Assessment Active Problems ICD-10 Chronic multifocal osteomyelitis, right ankle and foot Type 2 diabetes mellitus with foot ulcer Non-pressure chronic ulcer of other part of right foot with necrosis of bone Chronic combined systolic (congestive) and diastolic (congestive) heart failure Essential (primary) hypertension End stage renal disease Dependence on renal dialysis Dependence on supplemental oxygen Procedures Wound #2 Pre-procedure diagnosis of Wound #2 is a Diabetic Wound/Ulcer of the Lower Extremity located on the  Right,Circumferential Foot .Severity of Tissue Pre Debridement is: Necrosis of muscle. There was a Excisional Skin/Subcutaneous Tissue Debridement with a total area of 4 sq cm performed by Tommie Sams., PA-C. With the following instrument(s): Forceps, and Scissors to remove Viable and Non-Viable tissue/material. Material removed includes Callus, Subcutaneous Tissue, and Slough. A time out was conducted at 15:38, prior to the start of the procedure. A Minimum amount of  bleeding was controlled with Pressure. The procedure was tolerated well. Post Debridement Measurements: 12.8cm length x 8cm width x 0.2cm depth; 16.085cm^3 volume. Character of Wound/Ulcer Post Debridement is stable. Severity of Tissue Post Debridement is: Necrosis of muscle. Post procedure Diagnosis Wound #2: Same as Pre-Procedure Plan Crites, [name] ([id number]) [id number] Page 9 of 10 Follow-up Appointments: Return Appointment in 3 weeks. Nurse Visit as needed Home Health: [facility] for wound care. May utilize formulary equivalent dressing for wound treatment orders unless otherwise specified. Home Health Nurse may visit PRN to address patientoos wound care needs. Scheduled days for dressing changes to be completed; exception, patient has scheduled wound care visit that day. **Please direct any NON-WOUND related issues/requests for orders to patient's Primary Care Physician. **If current dressing causes regression in wound condition, may D/C ordered dressing product/s and apply Normal Saline Moist Dressing daily until next [place] or Other MD appointment. **Notify [facility] of regression in wound condition at [phone]. Bathing/ Shower/ Hygiene: Clean wound with Normal Saline or wound cleanser. Wash wounds with antibacterial soap and water. May shower; gently cleanse wound with antibacterial soap, rinse and pat dry prior to dressing wounds No tub bath. Off-Loading: Open toe surgical shoe Medications-Please add to medication list.: P.O. Antibiotics - continue Doxycycline Topical Antibiotic - gentamycin to open area of foot WOUND #2: - Foot Wound Laterality: Right, Circumferential Cleanser: Normal Saline 1 x Per Day/30 Days Discharge Instructions: Wash your hands with soap and water. Remove old dressing, discard into plastic bag and place into trash. Cleanse the wound with Normal Saline  prior to applying a clean dressing using gauze sponges, not tissues or cotton balls. Do not scrub or use excessive force. Pat dry using gauze sponges, not tissue or cotton balls. Cleanser: Soap and Water 1 x Per Day/30 Days Discharge Instructions: Gently cleanse wound with antibacterial soap, rinse and pat dry prior to dressing wounds Topical: Gentamicin 1 x Per Day/30 Days Discharge Instructions: Apply to open area of foot Prim Dressing: AquacelAg Advantage Dressing, 4x5 (in/in) (Dispense As Written) 1 x Per Day/30 Days ary Discharge Instructions: Apply to wound as directed Prim Dressing: Gauze 1 x Per Day/30 Days ary Discharge Instructions: soak with Betadine and wrap around toes Secondary Dressing: ABD Pad 5x9 (in/in) 1 x Per Day/30 Days Discharge Instructions: Cover with ABD pad Secured With: Medipore T - 6M Medipore H Soft Cloth Surgical T ape ape, 2x2 (in/yd) 1 x Per Day/30 Days Secured With: [employer] Sterile or Non-Sterile 6-ply 4.5x4 (yd/yd) 1 x Per Day/30 Days Discharge Instructions: Apply Kerlix as directed Secured With: [employer] Dressing, Latex-free, Size 5, Small-Head / Shoulder / Thigh 1 x Per Day/30 Days 1. I would recommend currently that we continue with the doxycycline which I think is probably can to be the best way to go and she is in agreement with that plan. 2. Also would recommend that we have the patient continue to monitor for any signs of infection or worsening. Based on what I am seeing I do feel  like that we are on the right track and I am pleased in that regard. 3. I do think as well that the topical gentamicin is good followed by the ABD pad and then roll gauze to secure in place she is using Betadine over the tip of the toes in order to keep these dry especially between the first and second toe which I think was draining last week and is much improved this week. We will see patient back for reevaluation in 1 week here in the clinic. If anything worsens or  changes patient will contact our office for additional recommendations. Electronic Signature(s) Signed: 04/08/2022 3:56:13 PM By: Worthy Keeler PA-C Previous Signature: 04/08/2022 3:55:22 PM Version By: Worthy Keeler PA-C Entered By: Worthy Keeler on 04/08/2022 15:56:13 -------------------------------------------------------------------------------- SuperBill Details Patient Name: Date of Service: [place], [name] 04/08/2022 Medical Record Number: [id number] Patient Account Number: [id number] Date of Birth/Sex: Treating RN: March 13, 1970 (52 y.o. [name] Primary Care Provider: Evern Bio Other Clinician: Massie Kluver Referring Provider: Treating Provider/Extender: Darien Ramus Weeks in Treatment: [address] Diagnosis Coding [place]allone, [name] ([id number]) [id number] Page 10 of 10 ICD-10 Codes Code Description M86.371 Chronic multifocal osteomyelitis, right ankle and foot E11.621 Type 2 diabetes mellitus with foot ulcer L97.514 Non-pressure chronic ulcer of other part of right foot with necrosis of bone I50.42 Chronic combined systolic (congestive) and diastolic (congestive) heart failure I10 Essential (primary) hypertension N18.6 End stage renal disease Z99.2 Dependence on renal dialysis Z99.81 Dependence on supplemental oxygen Facility Procedures : 3 CPT4 Code: [id number] Description: [id number] - DEB SUBQ TISSUE 20 SQ CM/< ICD-10 Diagnosis Description L97.514 Non-pressure chronic ulcer of other part of right foot with necrosis of bone Modifier: Quantity: 1 Physician Procedures : CPT4 Code Description Modifier [id number] - WC PHYS LEVEL 4 - EST PT 25 ICD-10 Diagnosis Description M86.371 Chronic multifocal osteomyelitis, right ankle and foot E11.621 Type 2 diabetes mellitus with foot ulcer I50.42 Chronic combined systolic  (congestive) and diastolic (congestive) heart failure L97.514 Non-pressure chronic ulcer of other part of right foot  with necrosis of bone Quantity: 1 : [id number] - WC PHYS SUBQ TISS 20 SQ CM ICD-10 Diagnosis Description L97.514 Non-pressure chronic ulcer of other part of right foot with necrosis of bone Quantity: 1 Electronic Signature(s) Signed: 04/08/2022 3:56:36 PM By: Worthy Keeler PA-C Entered By: Worthy Keeler on 04/08/2022 15:56:36

## 2022-04-10 DIAGNOSIS — E1169 Type 2 diabetes mellitus with other specified complication: Secondary | ICD-10-CM | POA: Diagnosis not present

## 2022-04-10 DIAGNOSIS — E11621 Type 2 diabetes mellitus with foot ulcer: Secondary | ICD-10-CM | POA: Diagnosis not present

## 2022-04-10 DIAGNOSIS — J9612 Chronic respiratory failure with hypercapnia: Secondary | ICD-10-CM | POA: Diagnosis not present

## 2022-04-10 DIAGNOSIS — N186 End stage renal disease: Secondary | ICD-10-CM | POA: Diagnosis not present

## 2022-04-10 DIAGNOSIS — E872 Acidosis, unspecified: Secondary | ICD-10-CM | POA: Diagnosis not present

## 2022-04-10 DIAGNOSIS — E1151 Type 2 diabetes mellitus with diabetic peripheral angiopathy without gangrene: Secondary | ICD-10-CM | POA: Diagnosis not present

## 2022-04-10 DIAGNOSIS — L089 Local infection of the skin and subcutaneous tissue, unspecified: Secondary | ICD-10-CM | POA: Diagnosis not present

## 2022-04-10 DIAGNOSIS — I70203 Unspecified atherosclerosis of native arteries of extremities, bilateral legs: Secondary | ICD-10-CM | POA: Diagnosis not present

## 2022-04-10 DIAGNOSIS — I132 Hypertensive heart and chronic kidney disease with heart failure and with stage 5 chronic kidney disease, or end stage renal disease: Secondary | ICD-10-CM | POA: Diagnosis not present

## 2022-04-10 DIAGNOSIS — D631 Anemia in chronic kidney disease: Secondary | ICD-10-CM | POA: Diagnosis not present

## 2022-04-10 DIAGNOSIS — J9611 Chronic respiratory failure with hypoxia: Secondary | ICD-10-CM | POA: Diagnosis not present

## 2022-04-10 DIAGNOSIS — Z7985 Long-term (current) use of injectable non-insulin antidiabetic drugs: Secondary | ICD-10-CM | POA: Diagnosis not present

## 2022-04-10 DIAGNOSIS — E1122 Type 2 diabetes mellitus with diabetic chronic kidney disease: Secondary | ICD-10-CM | POA: Diagnosis not present

## 2022-04-10 DIAGNOSIS — Z794 Long term (current) use of insulin: Secondary | ICD-10-CM | POA: Diagnosis not present

## 2022-04-10 DIAGNOSIS — Z992 Dependence on renal dialysis: Secondary | ICD-10-CM | POA: Diagnosis not present

## 2022-04-10 DIAGNOSIS — I951 Orthostatic hypotension: Secondary | ICD-10-CM | POA: Diagnosis not present

## 2022-04-10 DIAGNOSIS — Z9981 Dependence on supplemental oxygen: Secondary | ICD-10-CM | POA: Diagnosis not present

## 2022-04-10 DIAGNOSIS — E1165 Type 2 diabetes mellitus with hyperglycemia: Secondary | ICD-10-CM | POA: Diagnosis not present

## 2022-04-10 DIAGNOSIS — I5032 Chronic diastolic (congestive) heart failure: Secondary | ICD-10-CM | POA: Diagnosis not present

## 2022-04-10 DIAGNOSIS — J441 Chronic obstructive pulmonary disease with (acute) exacerbation: Secondary | ICD-10-CM | POA: Diagnosis not present

## 2022-04-10 DIAGNOSIS — M86371 Chronic multifocal osteomyelitis, right ankle and foot: Secondary | ICD-10-CM | POA: Diagnosis not present

## 2022-04-10 DIAGNOSIS — L97513 Non-pressure chronic ulcer of other part of right foot with necrosis of muscle: Secondary | ICD-10-CM | POA: Diagnosis not present

## 2022-04-11 DIAGNOSIS — M86171 Other acute osteomyelitis, right ankle and foot: Secondary | ICD-10-CM | POA: Diagnosis not present

## 2022-04-11 DIAGNOSIS — Z992 Dependence on renal dialysis: Secondary | ICD-10-CM | POA: Diagnosis not present

## 2022-04-11 DIAGNOSIS — N186 End stage renal disease: Secondary | ICD-10-CM | POA: Diagnosis not present

## 2022-04-12 DIAGNOSIS — M86371 Chronic multifocal osteomyelitis, right ankle and foot: Secondary | ICD-10-CM | POA: Diagnosis not present

## 2022-04-12 DIAGNOSIS — D631 Anemia in chronic kidney disease: Secondary | ICD-10-CM | POA: Diagnosis not present

## 2022-04-12 DIAGNOSIS — N186 End stage renal disease: Secondary | ICD-10-CM | POA: Diagnosis not present

## 2022-04-12 DIAGNOSIS — Z992 Dependence on renal dialysis: Secondary | ICD-10-CM | POA: Diagnosis not present

## 2022-04-12 DIAGNOSIS — L089 Local infection of the skin and subcutaneous tissue, unspecified: Secondary | ICD-10-CM | POA: Diagnosis not present

## 2022-04-12 DIAGNOSIS — I5032 Chronic diastolic (congestive) heart failure: Secondary | ICD-10-CM | POA: Diagnosis not present

## 2022-04-12 DIAGNOSIS — L97513 Non-pressure chronic ulcer of other part of right foot with necrosis of muscle: Secondary | ICD-10-CM | POA: Diagnosis not present

## 2022-04-12 DIAGNOSIS — E1169 Type 2 diabetes mellitus with other specified complication: Secondary | ICD-10-CM | POA: Diagnosis not present

## 2022-04-12 DIAGNOSIS — I132 Hypertensive heart and chronic kidney disease with heart failure and with stage 5 chronic kidney disease, or end stage renal disease: Secondary | ICD-10-CM | POA: Diagnosis not present

## 2022-04-12 DIAGNOSIS — Z794 Long term (current) use of insulin: Secondary | ICD-10-CM | POA: Diagnosis not present

## 2022-04-12 DIAGNOSIS — E1151 Type 2 diabetes mellitus with diabetic peripheral angiopathy without gangrene: Secondary | ICD-10-CM | POA: Diagnosis not present

## 2022-04-12 DIAGNOSIS — Z9981 Dependence on supplemental oxygen: Secondary | ICD-10-CM | POA: Diagnosis not present

## 2022-04-12 DIAGNOSIS — E11621 Type 2 diabetes mellitus with foot ulcer: Secondary | ICD-10-CM | POA: Diagnosis not present

## 2022-04-12 DIAGNOSIS — E1122 Type 2 diabetes mellitus with diabetic chronic kidney disease: Secondary | ICD-10-CM | POA: Diagnosis not present

## 2022-04-13 DIAGNOSIS — M86171 Other acute osteomyelitis, right ankle and foot: Secondary | ICD-10-CM | POA: Diagnosis not present

## 2022-04-13 DIAGNOSIS — Z992 Dependence on renal dialysis: Secondary | ICD-10-CM | POA: Diagnosis not present

## 2022-04-13 DIAGNOSIS — N186 End stage renal disease: Secondary | ICD-10-CM | POA: Diagnosis not present

## 2022-04-15 ENCOUNTER — Inpatient Hospital Stay
Admission: EM | Admit: 2022-04-15 | Discharge: 2022-04-20 | DRG: 299 | Disposition: A | Discharge status: Home Health | Payer: 59 | Attending: Internal Medicine | Admitting: Internal Medicine

## 2022-04-15 ENCOUNTER — Encounter: Payer: 59 | Admitting: Physician Assistant

## 2022-04-15 ENCOUNTER — Other Ambulatory Visit: Payer: Self-pay

## 2022-04-15 ENCOUNTER — Emergency Department: Payer: 59

## 2022-04-15 DIAGNOSIS — J441 Chronic obstructive pulmonary disease with (acute) exacerbation: Secondary | ICD-10-CM | POA: Diagnosis not present

## 2022-04-15 DIAGNOSIS — M1990 Unspecified osteoarthritis, unspecified site: Secondary | ICD-10-CM | POA: Diagnosis not present

## 2022-04-15 DIAGNOSIS — E11621 Type 2 diabetes mellitus with foot ulcer: Secondary | ICD-10-CM | POA: Diagnosis present

## 2022-04-15 DIAGNOSIS — J4489 Other specified chronic obstructive pulmonary disease: Secondary | ICD-10-CM | POA: Diagnosis present

## 2022-04-15 DIAGNOSIS — J9612 Chronic respiratory failure with hypercapnia: Secondary | ICD-10-CM | POA: Diagnosis not present

## 2022-04-15 DIAGNOSIS — M86671 Other chronic osteomyelitis, right ankle and foot: Secondary | ICD-10-CM | POA: Diagnosis not present

## 2022-04-15 DIAGNOSIS — Z888 Allergy status to other drugs, medicaments and biological substances status: Secondary | ICD-10-CM

## 2022-04-15 DIAGNOSIS — Z794 Long term (current) use of insulin: Secondary | ICD-10-CM

## 2022-04-15 DIAGNOSIS — Z882 Allergy status to sulfonamides status: Secondary | ICD-10-CM

## 2022-04-15 DIAGNOSIS — E1152 Type 2 diabetes mellitus with diabetic peripheral angiopathy with gangrene: Secondary | ICD-10-CM | POA: Diagnosis not present

## 2022-04-15 DIAGNOSIS — Z1152 Encounter for screening for COVID-19: Secondary | ICD-10-CM

## 2022-04-15 DIAGNOSIS — I70235 Atherosclerosis of native arteries of right leg with ulceration of other part of foot: Secondary | ICD-10-CM | POA: Diagnosis not present

## 2022-04-15 DIAGNOSIS — Z833 Family history of diabetes mellitus: Secondary | ICD-10-CM

## 2022-04-15 DIAGNOSIS — L97514 Non-pressure chronic ulcer of other part of right foot with necrosis of bone: Secondary | ICD-10-CM | POA: Diagnosis not present

## 2022-04-15 DIAGNOSIS — E872 Acidosis, unspecified: Secondary | ICD-10-CM | POA: Diagnosis not present

## 2022-04-15 DIAGNOSIS — J9611 Chronic respiratory failure with hypoxia: Secondary | ICD-10-CM | POA: Diagnosis present

## 2022-04-15 DIAGNOSIS — D631 Anemia in chronic kidney disease: Secondary | ICD-10-CM | POA: Diagnosis not present

## 2022-04-15 DIAGNOSIS — Z8249 Family history of ischemic heart disease and other diseases of the circulatory system: Secondary | ICD-10-CM

## 2022-04-15 DIAGNOSIS — R600 Localized edema: Secondary | ICD-10-CM | POA: Diagnosis not present

## 2022-04-15 DIAGNOSIS — Z89421 Acquired absence of other right toe(s): Secondary | ICD-10-CM

## 2022-04-15 DIAGNOSIS — E662 Morbid (severe) obesity with alveolar hypoventilation: Secondary | ICD-10-CM | POA: Diagnosis present

## 2022-04-15 DIAGNOSIS — Z9981 Dependence on supplemental oxygen: Secondary | ICD-10-CM

## 2022-04-15 DIAGNOSIS — J449 Chronic obstructive pulmonary disease, unspecified: Secondary | ICD-10-CM | POA: Diagnosis present

## 2022-04-15 DIAGNOSIS — F419 Anxiety disorder, unspecified: Secondary | ICD-10-CM | POA: Diagnosis present

## 2022-04-15 DIAGNOSIS — E871 Hypo-osmolality and hyponatremia: Secondary | ICD-10-CM | POA: Diagnosis present

## 2022-04-15 DIAGNOSIS — M869 Osteomyelitis, unspecified: Secondary | ICD-10-CM | POA: Diagnosis present

## 2022-04-15 DIAGNOSIS — L089 Local infection of the skin and subcutaneous tissue, unspecified: Secondary | ICD-10-CM

## 2022-04-15 DIAGNOSIS — R9431 Abnormal electrocardiogram [ECG] [EKG]: Secondary | ICD-10-CM | POA: Diagnosis not present

## 2022-04-15 DIAGNOSIS — I10 Essential (primary) hypertension: Secondary | ICD-10-CM | POA: Diagnosis present

## 2022-04-15 DIAGNOSIS — E1169 Type 2 diabetes mellitus with other specified complication: Secondary | ICD-10-CM | POA: Diagnosis present

## 2022-04-15 DIAGNOSIS — L97519 Non-pressure chronic ulcer of other part of right foot with unspecified severity: Secondary | ICD-10-CM | POA: Diagnosis not present

## 2022-04-15 DIAGNOSIS — T148XXA Other injury of unspecified body region, initial encounter: Secondary | ICD-10-CM | POA: Diagnosis not present

## 2022-04-15 DIAGNOSIS — E1122 Type 2 diabetes mellitus with diabetic chronic kidney disease: Secondary | ICD-10-CM | POA: Diagnosis not present

## 2022-04-15 DIAGNOSIS — L97512 Non-pressure chronic ulcer of other part of right foot with fat layer exposed: Secondary | ICD-10-CM | POA: Diagnosis not present

## 2022-04-15 DIAGNOSIS — N2581 Secondary hyperparathyroidism of renal origin: Secondary | ICD-10-CM | POA: Diagnosis present

## 2022-04-15 DIAGNOSIS — Z79899 Other long term (current) drug therapy: Secondary | ICD-10-CM

## 2022-04-15 DIAGNOSIS — F32A Depression, unspecified: Secondary | ICD-10-CM | POA: Diagnosis not present

## 2022-04-15 DIAGNOSIS — M86071 Acute hematogenous osteomyelitis, right ankle and foot: Secondary | ICD-10-CM | POA: Diagnosis present

## 2022-04-15 DIAGNOSIS — N189 Chronic kidney disease, unspecified: Secondary | ICD-10-CM | POA: Diagnosis not present

## 2022-04-15 DIAGNOSIS — Z7951 Long term (current) use of inhaled steroids: Secondary | ICD-10-CM

## 2022-04-15 DIAGNOSIS — Z91048 Other nonmedicinal substance allergy status: Secondary | ICD-10-CM

## 2022-04-15 DIAGNOSIS — M868X7 Other osteomyelitis, ankle and foot: Secondary | ICD-10-CM | POA: Diagnosis not present

## 2022-04-15 DIAGNOSIS — I132 Hypertensive heart and chronic kidney disease with heart failure and with stage 5 chronic kidney disease, or end stage renal disease: Secondary | ICD-10-CM | POA: Diagnosis not present

## 2022-04-15 DIAGNOSIS — E1151 Type 2 diabetes mellitus with diabetic peripheral angiopathy without gangrene: Secondary | ICD-10-CM | POA: Diagnosis not present

## 2022-04-15 DIAGNOSIS — N186 End stage renal disease: Secondary | ICD-10-CM | POA: Diagnosis present

## 2022-04-15 DIAGNOSIS — Z6841 Body Mass Index (BMI) 40.0 and over, adult: Secondary | ICD-10-CM | POA: Diagnosis not present

## 2022-04-15 DIAGNOSIS — I96 Gangrene, not elsewhere classified: Secondary | ICD-10-CM | POA: Diagnosis not present

## 2022-04-15 DIAGNOSIS — I5030 Unspecified diastolic (congestive) heart failure: Secondary | ICD-10-CM | POA: Insufficient documentation

## 2022-04-15 DIAGNOSIS — Z992 Dependence on renal dialysis: Secondary | ICD-10-CM

## 2022-04-15 DIAGNOSIS — I5032 Chronic diastolic (congestive) heart failure: Secondary | ICD-10-CM | POA: Diagnosis present

## 2022-04-15 DIAGNOSIS — I739 Peripheral vascular disease, unspecified: Secondary | ICD-10-CM | POA: Diagnosis present

## 2022-04-15 DIAGNOSIS — E119 Type 2 diabetes mellitus without complications: Secondary | ICD-10-CM

## 2022-04-15 DIAGNOSIS — A419 Sepsis, unspecified organism: Secondary | ICD-10-CM | POA: Diagnosis not present

## 2022-04-15 DIAGNOSIS — E8729 Other acidosis: Secondary | ICD-10-CM | POA: Diagnosis present

## 2022-04-15 DIAGNOSIS — E1140 Type 2 diabetes mellitus with diabetic neuropathy, unspecified: Secondary | ICD-10-CM | POA: Diagnosis not present

## 2022-04-15 DIAGNOSIS — L97509 Non-pressure chronic ulcer of other part of unspecified foot with unspecified severity: Secondary | ICD-10-CM | POA: Diagnosis not present

## 2022-04-15 DIAGNOSIS — E1165 Type 2 diabetes mellitus with hyperglycemia: Secondary | ICD-10-CM | POA: Diagnosis present

## 2022-04-15 DIAGNOSIS — M86371 Chronic multifocal osteomyelitis, right ankle and foot: Secondary | ICD-10-CM | POA: Diagnosis not present

## 2022-04-15 DIAGNOSIS — I5042 Chronic combined systolic (congestive) and diastolic (congestive) heart failure: Secondary | ICD-10-CM | POA: Diagnosis not present

## 2022-04-15 DIAGNOSIS — S91301A Unspecified open wound, right foot, initial encounter: Secondary | ICD-10-CM | POA: Diagnosis not present

## 2022-04-15 DIAGNOSIS — Z7982 Long term (current) use of aspirin: Secondary | ICD-10-CM

## 2022-04-15 DIAGNOSIS — M86171 Other acute osteomyelitis, right ankle and foot: Secondary | ICD-10-CM | POA: Diagnosis not present

## 2022-04-15 LAB — COMPREHENSIVE METABOLIC PANEL
ALT: 22 U/L (ref 0–44)
AST: 21 U/L (ref 15–41)
Albumin: 3.8 g/dL (ref 3.5–5.0)
Alkaline Phosphatase: 417 U/L — ABNORMAL HIGH (ref 38–126)
Anion gap: 18 — ABNORMAL HIGH (ref 5–15)
BUN: 65 mg/dL — ABNORMAL HIGH (ref 6–20)
CO2: 19 mmol/L — ABNORMAL LOW (ref 22–32)
Calcium: 8.7 mg/dL — ABNORMAL LOW (ref 8.9–10.3)
Chloride: 95 mmol/L — ABNORMAL LOW (ref 98–111)
Creatinine, Ser: 8.83 mg/dL — ABNORMAL HIGH (ref 0.44–1.00)
GFR, Estimated: 5 mL/min — ABNORMAL LOW (ref >60)
Glucose, Bld: 240 mg/dL — ABNORMAL HIGH (ref 70–99)
Potassium: 3.8 mmol/L (ref 3.5–5.1)
Sodium: 132 mmol/L — ABNORMAL LOW (ref 135–145)
Total Bilirubin: 0.9 mg/dL (ref 0.3–1.2)
Total Protein: 7.4 g/dL (ref 6.5–8.1)

## 2022-04-15 LAB — LACTIC ACID, PLASMA
Lactic Acid, Venous: 1.6 mmol/L (ref 0.5–1.9)
Lactic Acid, Venous: 1.6 mmol/L (ref 0.5–1.9)

## 2022-04-15 LAB — CBC WITH DIFFERENTIAL/PLATELET
Abs Immature Granulocytes: 0.04 10*3/uL (ref 0.00–0.07)
Basophils Absolute: 0.1 10*3/uL (ref 0.0–0.1)
Basophils Relative: 1 %
Eosinophils Absolute: 0.1 10*3/uL (ref 0.0–0.5)
Eosinophils Relative: 1 %
HCT: 39.5 % (ref 36.0–46.0)
Hemoglobin: 11.9 g/dL — ABNORMAL LOW (ref 12.0–15.0)
Immature Granulocytes: 1 %
Lymphocytes Relative: 12 %
Lymphs Abs: 1.1 10*3/uL (ref 0.7–4.0)
MCH: 29.2 pg (ref 26.0–34.0)
MCHC: 30.1 g/dL (ref 30.0–36.0)
MCV: 97.1 fL (ref 80.0–100.0)
Monocytes Absolute: 0.6 10*3/uL (ref 0.1–1.0)
Monocytes Relative: 7 %
Neutro Abs: 6.9 10*3/uL (ref 1.7–7.7)
Neutrophils Relative %: 78 %
Platelets: 291 10*3/uL (ref 150–400)
RBC: 4.07 MIL/uL (ref 3.87–5.11)
RDW: 13.8 % (ref 11.5–15.5)
WBC: 8.7 10*3/uL (ref 4.0–10.5)
nRBC: 0 % (ref 0.0–0.2)

## 2022-04-15 LAB — RESP PANEL BY RT-PCR (RSV, FLU A&B, COVID)  RVPGX2
Influenza A by PCR: NEGATIVE
Influenza B by PCR: NEGATIVE
Resp Syncytial Virus by PCR: NEGATIVE
SARS Coronavirus 2 by RT PCR: NEGATIVE

## 2022-04-15 LAB — SEDIMENTATION RATE: Sed Rate: 24 mm/hr (ref 0–30)

## 2022-04-15 LAB — PROTIME-INR
INR: 1.2 (ref 0.8–1.2)
Prothrombin Time: 15.1 seconds (ref 11.4–15.2)

## 2022-04-15 MED ORDER — SEVELAMER CARBONATE 2.4 G PO PACK
2.4000 g | PACK | Freq: Three times a day (TID) | ORAL | Status: DC
  Administered 2022-04-16: 2.4 g via ORAL
  Filled 2022-04-15 (×3): qty 1

## 2022-04-15 MED ORDER — METRONIDAZOLE 500 MG PO TABS
500.0000 mg | ORAL_TABLET | Freq: Three times a day (TID) | ORAL | Status: DC
  Administered 2022-04-16 – 2022-04-19 (×9): 500 mg via ORAL
  Filled 2022-04-15 (×10): qty 1

## 2022-04-15 MED ORDER — ONDANSETRON HCL 4 MG PO TABS: 4.0000 mg | ORAL_TABLET | Freq: Four times a day (QID) | ORAL | Status: DC | PRN

## 2022-04-15 MED ORDER — OXYCODONE-ACETAMINOPHEN 5-325 MG PO TABS
1.0000 | ORAL_TABLET | Freq: Once | ORAL | Status: AC
  Administered 2022-04-15: 1 via ORAL
  Filled 2022-04-15: qty 1

## 2022-04-15 MED ORDER — BUDESONIDE 0.5 MG/2ML IN SUSP
0.5000 mg | Freq: Two times a day (BID) | RESPIRATORY_TRACT | Status: DC
  Administered 2022-04-16 – 2022-04-20 (×9): 0.5 mg via RESPIRATORY_TRACT
  Filled 2022-04-15 (×9): qty 2

## 2022-04-15 MED ORDER — SODIUM CHLORIDE 0.9 % IV SOLN
2.0000 g | INTRAVENOUS | Status: DC
  Administered 2022-04-16 – 2022-04-18 (×4): 2 g via INTRAVENOUS
  Filled 2022-04-15: qty 20
  Filled 2022-04-15: qty 2
  Filled 2022-04-15: qty 20
  Filled 2022-04-15: qty 2

## 2022-04-15 MED ORDER — ACETAMINOPHEN 500 MG PO TABS: 1000.0000 mg | ORAL_TABLET | Freq: Four times a day (QID) | ORAL | Status: DC | PRN

## 2022-04-15 MED ORDER — VANCOMYCIN HCL 1250 MG/250ML IV SOLN
1250.0000 mg | Freq: Once | INTRAVENOUS | Status: AC
  Administered 2022-04-16: 1250 mg via INTRAVENOUS
  Filled 2022-04-15: qty 250

## 2022-04-15 MED ORDER — ALBUTEROL SULFATE (2.5 MG/3ML) 0.083% IN NEBU: 2.5000 mg | INHALATION_SOLUTION | Freq: Four times a day (QID) | RESPIRATORY_TRACT | Status: DC | PRN

## 2022-04-15 MED ORDER — INSULIN GLARGINE-YFGN 100 UNIT/ML ~~LOC~~ SOLN
15.0000 [IU] | Freq: Every day | SUBCUTANEOUS | Status: DC
  Administered 2022-04-16 – 2022-04-20 (×4): 15 [IU] via SUBCUTANEOUS
  Filled 2022-04-15 (×5): qty 0.15

## 2022-04-15 MED ORDER — ATORVASTATIN CALCIUM 20 MG PO TABS
40.0000 mg | ORAL_TABLET | Freq: Every day | ORAL | Status: DC
  Administered 2022-04-17 – 2022-04-20 (×3): 40 mg via ORAL
  Filled 2022-04-15 (×3): qty 2

## 2022-04-15 MED ORDER — HEPARIN SODIUM (PORCINE) 5000 UNIT/ML IJ SOLN
5000.0000 [IU] | Freq: Three times a day (TID) | INTRAMUSCULAR | Status: DC
  Administered 2022-04-16 – 2022-04-20 (×12): 5000 [IU] via SUBCUTANEOUS
  Filled 2022-04-15 (×13): qty 1

## 2022-04-15 MED ORDER — ACETAMINOPHEN 650 MG RE SUPP: 650.0000 mg | Freq: Four times a day (QID) | RECTAL | Status: DC | PRN

## 2022-04-15 MED ORDER — ONDANSETRON HCL 4 MG/2ML IJ SOLN: 4.0000 mg | Freq: Four times a day (QID) | INTRAMUSCULAR | Status: DC | PRN

## 2022-04-15 MED ORDER — ACETAMINOPHEN 325 MG PO TABS
650.0000 mg | ORAL_TABLET | Freq: Four times a day (QID) | ORAL | Status: DC | PRN
  Administered 2022-04-16: 650 mg via ORAL

## 2022-04-15 MED ORDER — INSULIN ASPART 100 UNIT/ML IJ SOLN
0.0000 [IU] | INTRAMUSCULAR | Status: DC
  Administered 2022-04-16: 3 [IU] via SUBCUTANEOUS
  Administered 2022-04-16 (×2): 2 [IU] via SUBCUTANEOUS
  Administered 2022-04-16: 1 [IU] via SUBCUTANEOUS
  Administered 2022-04-17 (×2): 2 [IU] via SUBCUTANEOUS
  Administered 2022-04-17 (×2): 1 [IU] via SUBCUTANEOUS
  Administered 2022-04-18 (×2): 3 [IU] via SUBCUTANEOUS
  Administered 2022-04-18 (×2): 1 [IU] via SUBCUTANEOUS
  Administered 2022-04-19: 2 [IU] via SUBCUTANEOUS
  Administered 2022-04-19: 1 [IU] via SUBCUTANEOUS
  Administered 2022-04-19 – 2022-04-20 (×5): 2 [IU] via SUBCUTANEOUS
  Filled 2022-04-15 (×20): qty 1

## 2022-04-15 MED ORDER — ASPIRIN 81 MG PO TBEC
81.0000 mg | DELAYED_RELEASE_TABLET | Freq: Every day | ORAL | Status: DC
  Administered 2022-04-17 – 2022-04-20 (×3): 81 mg via ORAL
  Filled 2022-04-15 (×3): qty 1

## 2022-04-15 NOTE — Assessment & Plan Note (Signed)
Anion gap metabolic acidosis Patient with bicarb of 19 and anion gap of 18, unclear etiology.  DKA not suspected, but will get beta hydroxybutyric acid Continue to monitor Expecting improvement with dialysis

## 2022-04-15 NOTE — Assessment & Plan Note (Addendum)
Peripheral vascular disease s/p prior fifths with amputation right foot Rocephin and vancomycin Podiatry and vascular consults Continue aspirin and atorvastatin Will keep n.p.o. from midnight

## 2022-04-15 NOTE — Assessment & Plan Note (Deleted)
Obesity hypoventilation syndrome Chronic respiratory failure with hypoxia Stable, not exacerbated Continue home inhalers Continue supplemental oxygen

## 2022-04-15 NOTE — Assessment & Plan Note (Addendum)
Continue basal insulin with sliding scale coverage

## 2022-04-15 NOTE — H&P (Signed)
History and Physical    Patient: [name] [id number] DOB: 22-Dec-1970 DOA: 04/15/2022 DOS: the patient was seen and examined on 04/15/2022 PCP: Evern Bio, NP  Patient coming from: Home  Chief Complaint:  Chief Complaint  Patient presents with   Foot Pain    HPI: [name] is a 52 y.o. female with medical history significant for HTN, COPD on 3-4 LO2, ESRD on HD TTS, type II DM , OSA, prior osteomyelitis right foot s/p fifth ray amputation w and with chronic nonhealing ulcers, followed at wound care center, last debrided on 2/12, sent to the ED with concerns for gangrene and Osteomyelitis of the foot.  Patient has been on multiple rounds of antibiotics but wound has not been healing.  Patient denies fever or chills. ED course and data review:: Temperature 99.1 and pulse 108 with otherwise normal vitals.  WBC normal at 8700 with lactic acid 1.6.  Sed rate 24.  Labs otherwise unremarkable.  Foot x-ray consistent with osteomyelitis showing the following: IMPRESSION: 1. Previous fifth transmetatarsal amputation. 2. Interval progression of bony destructive change involving the distal second third and fourth metatarsals as well as the second third and fourth proximal phalanges consistent with osteomyelitis. 3. Widening of the first MTP joint with erosive changes at the base of first proximal phalanx and head of first metatarsal consistent with septic joint and osteomyelitis. New erosive changes at the head of the first proximal phalanx also consistent with osteomyelitis.  Chest x-ray showed vascular congestion but patient has no respiratory symptoms.   Review of Systems: As mentioned in the history of present illness. All other systems reviewed and are negative.  Past Medical History:  Diagnosis Date   (HFpEF) heart failure with preserved ejection fraction ([place])    a. 11/2014 Echo: EF 50-55%, Gr1 DD; b. 06/2016 Echo: EF 60-65%, no rwma, mod dil LA, nl RV, triv eff.    Anxiety    Asthma    Bronchitis    CHF (congestive heart failure) (HCC)    Chronic cough    Chronic respiratory failure with hypoxia and hypercapnia ([place])    a. Home O2.  (2-4L)   CKD (chronic kidney disease), stage III (HCC)    Depression    Diabetes mellitus without complication (HCC)    Diverticulosis    Dyspnea    Dysrhythmia    per pt, no arrythmias   Environmental and seasonal allergies    Extreme obesity with alveolar hypoventilation (HCC)    Hypertension    Iron deficiency anemia    a. chronic blood loss->heavy menses.   Kidney failure    dialysis tues/thur/sat   ... left arm shunt   Orthopnea    Oxygen dependent    3l/min   Pericardial effusion    a. 11/2014 CT Chest: Mod effusion; b. 11/2014 Echo: small to mod circumferential effusion. No hemodynamic compromise.   Pneumonia    history   Poorly controlled diabetes mellitus ([place])    a. 11/2014 A1c 13.2.   Swelling of both lower extremities    Past Surgical History:  Procedure Laterality Date   A/V FISTULAGRAM Left 06/09/2017   Procedure: A/V FISTULAGRAM;  Surgeon: Algernon Huxley, MD;  Location: [place] [facility];  Service: Cardiovascular;  Laterality: Left;   A/V FISTULAGRAM Left 06/16/2017   Procedure: A/V FISTULAGRAM;  Surgeon: Algernon Huxley, MD;  Location: [place] [facility];  Service: Cardiovascular;  Laterality: Left;   A/V FISTULAGRAM Left 01/28/2018   Procedure: A/V FISTULAGRAM;  Surgeon: Algernon Huxley, MD;  Location: [place] [facility];  Service: Cardiovascular;  Laterality: Left;   AMPUTATION TOE Right 10/21/2020   Procedure: AMPUTATION TOE-5th Digit Amputation Right Foot;  Surgeon: Edrick Kins, DPM;  Location: [facility];  Service: Podiatry;  Laterality: Right;   AV FISTULA PLACEMENT Left 04/25/2017   Procedure: ARTERIOVENOUS (AV) FISTULA CREATION;  Surgeon: Algernon Huxley, MD;  Location: [facility];  Service: Vascular;  Laterality: Left;   BONE BIOPSY Right 04/27/2021   Procedure: Right 4th metatarsal BONE  BIOPSY;  Surgeon: [name], DPM;  Location: [facility];  Service: Podiatry;  Laterality: Right;   CATARACT EXTRACTION W/PHACO Left 01/21/2018   Procedure: CATARACT EXTRACTION PHACO AND INTRAOCULAR LENS PLACEMENT (IOC);  Surgeon: Leandrew Koyanagi, MD;  Location: [facility];  Service: Ophthalmology;  Laterality: Left;  [place] 01:55.5 CDE 9.05 EAUP 13.9 Fluid Pack Lot # [id number]   CESAREAN SECTION     times 3   DIALYSIS/PERMA CATHETER INSERTION Right 04/04/2017   Procedure: DIALYSIS/PERMA CATHETER INSERTION;  Surgeon: Katha Cabal, MD;  Location: [place] [facility];  Service: Cardiovascular;  Laterality: Right;   DIALYSIS/PERMA CATHETER REMOVAL N/A 09/17/2017   Procedure: DIALYSIS/PERMA CATHETER REMOVAL;  Surgeon: Algernon Huxley, MD;  Location: [place] [facility];  Service: Cardiovascular;  Laterality: N/A;   INCISION AND DRAINAGE Right 10/26/2020   Procedure: INCISION AND DRAINAGE;  Surgeon: [name], DPM;  Location: [facility];  Service: Podiatry;  Laterality: Right;   INSERTION OF DIALYSIS CATHETER N/A 04/16/2017   Procedure: INSERTION OF DIALYSIS PERM CATHETER;  Surgeon: Algernon Huxley, MD;  Location: [facility];  Service: Vascular;  Laterality: N/A;   LOWER EXTREMITY ANGIOGRAPHY Right 10/18/2020   Procedure: Lower Extremity Angiography;  Surgeon: Algernon Huxley, MD;  Location: [place] [facility];  Service: Cardiovascular;  Laterality: Right;   Social History:  reports that she has never smoked. She has been exposed to tobacco smoke. She has never used smokeless tobacco. She reports that she does not currently use alcohol. She reports current drug use. Drug: Marijuana.  Allergies  Allergen Reactions   Dust Mite Extract Shortness Of Breath and Itching   Mold Extract [Trichophyton] Shortness Of Breath and Itching   Pollen Extract Shortness Of Breath    Itching, shortness of breath, asthma like symptoms   Ozempic (0.25 Or 0.5 Mg-Dose) [Semaglutide(0.25 Or 0.1m-Dos)] Other (See Comments)     pancreatitis   Sulfa Antibiotics Itching   Misc. Sulfonamide Containing Compounds    Feraheme [Ferumoxytol] Itching    Uses benadryl so she can get the medicine    Family History  Problem Relation Age of Onset   Diabetes Mother    Hypertension Mother    Hypertension Father    Diabetes Father    Hypertension Other    Diabetes Other    Breast cancer Maternal Aunt [phone]  Breast cancer Maternal Aunt [phone]   Prior to Admission medications   Medication Sig Start Date End Date Taking? Authorizing Provider  acetaminophen (TYLENOL) 500 MG tablet Take 1,000 mg by mouth every 6 (six) hours as needed for mild pain, fever or headache.    [provider]  albuterol (PROVENTIL) (2.5 MG/3ML) 0.083% nebulizer solution Take 3 mLs (2.5 mg total) by nebulization every 6 (six) hours as needed for wheezing or shortness of breath. 02/15/22   GTyler Pita MD  albuterol (VENTOLIN HFA) 108 (90 Base) MCG/ACT inhaler Inhale 2 puffs into the lungs every 6 (  six) hours as needed for wheezing or shortness of breath. 02/15/22   Tyler Pita, MD  aspirin EC 81 MG tablet Take 81 mg by mouth daily.     [provider]  atorvastatin (LIPITOR) 40 MG tablet Take 1 tablet (40 mg total) by mouth daily. 10/23/21   Minna Merritts, MD  b complex-vitamin c-folic acid (NEPHRO-VITE) 0.8 MG TABS tablet Take 1 tablet by mouth at bedtime.    [provider]  budesonide (PULMICORT) 0.25 MG/2ML nebulizer solution Take 2 mLs (0.25 mg total) by nebulization 2 (two) times daily. 01/24/21   Tyler Pita, MD  budesonide (PULMICORT) 0.5 MG/2ML nebulizer solution Take by nebulization. 04/17/21   [provider]  Cholecalciferol 100 MCG (4000 UT) CAPS Take 1 capsule by mouth once a week.    [provider]  clindamycin (CLEOCIN T) 1 % lotion Apply 1 application topically daily. 09/19/20   [provider]  ferric citrate (AURYXIA) 1 GM 210 MG(Fe) tablet Take 210 mg by mouth  3 (three) times daily with meals. Only taking once a day most days    [provider]  formoterol (PERFOROMIST) 20 MCG/2ML nebulizer solution  01/19/21   [provider]  insulin degludec (TRESIBA) 100 UNIT/ML FlexTouch Pen Inject 30 Units into the skin daily. 05/01/21   Richarda Osmond, MD  insulin lispro (HUMALOG) 100 UNIT/ML KwikPen Inject into the skin. 06/12/21   [provider]  lactulose (CHRONULAC) 10 GM/15ML solution Take 30 mLs (20 g total) by mouth daily as needed for severe constipation. 04/26/17   Epifanio Lesches, MD  lidocaine-prilocaine (EMLA) cream Apply topically as needed (For access for dialysis).    [provider]  LINZESS 290 MCG CAPS capsule Take 290 mcg by mouth every morning. 10/10/20   [provider]  meloxicam (MOBIC) 15 MG tablet Take 1 tablet (15 mg total) by mouth daily as needed for pain. 10/23/21   Minna Merritts, MD  metoprolol tartrate (LOPRESSOR) 50 MG tablet Take 1 tablet (50 mg total) by mouth 2 (two) times daily. Patient not taking: Reported on 02/27/2022 06/18/21   Minna Merritts, MD  midodrine (PROAMATINE) 10 MG tablet Take 20 mg by mouth daily as needed. 08/23/21   [provider]  montelukast (SINGULAIR) 10 MG tablet Take 1 tablet (10 mg total) by mouth at bedtime. 09/17/21   Tyler Pita, MD  Omeprazole Magnesium (PRILOSEC PO)     [provider]  OXYGEN Inhale 2-4 L into the lungs.    [provider]  pregabalin (LYRICA) 50 MG capsule Take 100 mg by mouth every 8 (eight) hours as needed. 08/08/21   [provider]  RENVELA 2.4 g PACK Take 2.4 g by mouth 3 (three) times daily. 08/07/21   [provider]  revefenacin Maretta Bees) 175 MCG/3ML nebulizer solution  01/19/21   [provider]    Physical Exam: Vitals:   04/15/22 1638 04/15/22 1930  BP: 110/72 110/63  Pulse: (!) 108 (!) 107  Resp: 18 20  Temp: 99.1 F (37.3 C)   TempSrc: Oral   SpO2:  92% 95%  Weight: 123.8 kg   Height: 5' 4"$  (1.626 m)    Physical Exam  Labs on Admission: I have personally reviewed following labs and imaging studies  CBC: Recent Labs  Lab 04/15/22 1654  WBC 8.7  NEUTROABS 6.9  HGB 11.9*  HCT 39.5  MCV 97.1  PLT [id number]   Basic Metabolic Panel: Recent Labs  Lab 04/15/22 1654  NA 132*  K 3.8  CL 95*  CO2 19*  GLUCOSE 240*  BUN 65*  CREATININE 8.83*  CALCIUM 8.7*   GFR: Estimated Creatinine Clearance: 9.9 mL/min (A) (by C-G formula based on SCr of 8.83 mg/dL (H)). Liver Function Tests: Recent Labs  Lab 04/15/22 1654  AST 21  ALT 22  ALKPHOS 417*  BILITOT 0.9  PROT 7.4  ALBUMIN 3.8   No results for input(s): "LIPASE", "AMYLASE" in the last 168 hours. No results for input(s): "AMMONIA" in the last 168 hours. Coagulation Profile: Recent Labs  Lab 04/15/22 1654  INR 1.2   Cardiac Enzymes: No results for input(s): "CKTOTAL", "CKMB", "CKMBINDEX", "TROPONINI" in the last 168 hours. BNP (last 3 results) No results for input(s): "PROBNP" in the last 8760 hours. HbA1C: No results for input(s): "HGBA1C" in the last 72 hours. CBG: No results for input(s): "GLUCAP" in the last 168 hours. Lipid Profile: No results for input(s): "CHOL", "HDL", "LDLCALC", "TRIG", "CHOLHDL", "LDLDIRECT" in the last 72 hours. Thyroid Function Tests: No results for input(s): "TSH", "T4TOTAL", "FREET4", "T3FREE", "THYROIDAB" in the last 72 hours. Anemia Panel: No results for input(s): "VITAMINB12", "FOLATE", "FERRITIN", "TIBC", "IRON", "RETICCTPCT" in the last 72 hours. Urine analysis:    Component Value Date/Time   COLORURINE YELLOW (A) 04/02/2017 1548   APPEARANCEUR HAZY (A) 04/02/2017 1548   LABSPEC 1.008 04/02/2017 1548   PHURINE 5.0 04/02/2017 1548   GLUCOSEU NEGATIVE 04/02/2017 1548   HGBUR NEGATIVE 04/02/2017 1548   [place] 04/02/2017 [place] 04/02/2017 1548   PROTEINUR 100 (A) 04/02/2017 1548   NITRITE  NEGATIVE 04/02/2017 1548   LEUKOCYTESUR NEGATIVE 04/02/2017 1548    Radiological Exams on Admission: DG Foot 2 Views Right  Result Date: 04/15/2022 CLINICAL DATA:  Right foot wounds EXAM: RIGHT FOOT - 2 VIEW COMPARISON:  04/21/2021, MRI 04/24/2021 FINDINGS: Previous fifth transmetatarsal amputation, no acute findings at this location. Interval progression of bony destructive change involving the distal fourth metatarsal and base of the fourth proximal phalanx. Osseous destructive change involving the head of third metatarsal and base of third proximal phalanx. Similar findings involving head of second metatarsal and base of second proximal phalanx with probable pathologic fracture at the head of second metatarsal. Widening of the first MTP joint with erosive changes at the base of first proximal phalanx and head of first metatarsal also concerning for septic arthritis and osteomyelitis. Small erosion head of the first proximal phalanx. Vascular calcifications. Multiple ulcerations along the plantar surface of the foot. No frank soft tissue gas. IMPRESSION: 1. Previous fifth transmetatarsal amputation. 2. Interval progression of bony destructive change involving the distal second third and fourth metatarsals as well as the second third and fourth proximal phalanges consistent with osteomyelitis. 3. Widening of the first MTP joint with erosive changes at the base of first proximal phalanx and head of first metatarsal consistent with septic joint and osteomyelitis. New erosive changes at the head of the first proximal phalanx also consistent with osteomyelitis. Electronically Signed   By: Donavan Foil M.D.   On: 04/15/2022 20:45   DG Chest 2 View  Result Date: 04/15/2022 CLINICAL DATA:  Sepsis EXAM: CHEST - 2 VIEW COMPARISON:  X-ray 12/02/2021 and older FINDINGS: Calcifications of the tracheobronchial tree. Underinflation with some basilar atelectasis or scar. Enlarged cardiopericardial silhouette with  increasing interstitial changes. Peribronchial thickening. No pneumothorax. Vascular calcifications. Films are under penetrated IMPRESSION: Enlarged cardiopericardial silhouette with vascular congestion and interstitial changes. Acute process  is possible. Please correlate with symptoms and recommend follow-up Electronically Signed   By: Jill Side M.D.   On: 04/15/2022 17:26     Data Reviewed: Relevant notes from primary care and specialist visits, past discharge summaries as available in EHR, including Care Everywhere. Prior diagnostic testing as pertinent to current admission diagnoses Updated medications and problem lists for reconciliation ED course, including vitals, labs, imaging, treatment and response to treatment Triage notes, nursing and pharmacy notes and ED provider's notes Notable results as noted in HPI   Assessment and Plan: * Osteomyelitis of right foot ([place]) Peripheral vascular disease s/p prior fifths with amputation right foot Rocephin and vancomycin Podiatry and vascular consults Continue aspirin and atorvastatin Will keep n.p.o. from midnight  ESRD on hemodialysis ([place]) Anion gap metabolic acidosis Patient with bicarb of 19 and anion gap of 18, unclear etiology.  DKA not suspected, but will get beta hydroxybutyric acid Continue to monitor Expecting improvement with dialysis  Uncontrolled type 2 diabetes mellitus with hyperglycemia, with long-term current use of insulin (HCC) Blood sugar 240 Continue basal insulin with sliding scale coverage  Chronic diastolic heart failure (HCC) Clinically euvolemic Continue metoprolol  COPD with chronic bronchitis Obesity hypoventilation syndrome Chronic respiratory failure with hypoxia Stable, not exacerbated Continue home inhalers Continue supplemental oxygen  Hypertension Continue metoprolol.  Patient also takes midodrine  Obesity, Class III, BMI 40-49.9 (morbid obesity) (HCC) Complicating factor to overall  prognosis and care        DVT prophylaxis: Heparin  Consults: Nephrology, Dr. Candiss Norse, podiatry, Dr. Sherryle Lis, vascular, Dr. Lucky Cowboy  Advance Care Planning:   Code Status: Prior   Family Communication: none  Disposition Plan: Back to previous home environment  Severity of Illness: The appropriate patient status for this patient is INPATIENT. Inpatient status is judged to be reasonable and necessary in order to provide the required intensity of service to ensure the patient's safety. The patient's presenting symptoms, physical exam findings, and initial radiographic and laboratory data in the context of their chronic comorbidities is felt to place them at high risk for further clinical deterioration. Furthermore, it is not anticipated that the patient will be medically stable for discharge from the hospital within 2 midnights of admission.   * I certify that at the point of admission it is my clinical judgment that the patient will require inpatient hospital care spanning beyond 2 midnights from the point of admission due to high intensity of service, high risk for further deterioration and high frequency of surveillance required.*  Author: Athena Masse, MD 04/15/2022 10:11 PM  For on call review [email or web address].

## 2022-04-15 NOTE — ED Provider Notes (Signed)
[facility] Provider Note    Event Date/Time   First MD Initiated Contact with Patient 04/15/22 1857     (approximate)   History   Foot Pain   HPI  [name] is a 52 y.o. female past medical history significant for ESRD on HD TThSa, history of known osteomyelitis to her right foot followed by wound care, who presents to the emergency department with concern for an infection to her right foot.  States that she has been followed with wound care for some time and they sent her into the emergency department today because they were concerned about a worsening infection to her first metatarsal.  States that the wound to the bottom of her foot has been improving.  Denies any fever or chills, nausea or vomiting.  Currently on doxycycline.  Has been followed by podiatry in the past.     Physical Exam   Triage Vital Signs: ED Triage Vitals [04/15/22 1638]  Enc Vitals Group     BP 110/72     Pulse Rate (!) 108     Resp 18     Temp 99.1 F (37.3 C)     Temp Source Oral     SpO2 92 %     Weight 272 lb 14.9 oz (123.8 kg)     Height 5' 4"$  (1.626 m)     Head Circumference      Peak Flow      Pain Score 5     Pain Loc      Pain Edu?      Excl. in [place]?     Most recent vital signs: Vitals:   04/15/22 1930 04/15/22 2300  BP: 110/63   Pulse: (!) 107   Resp: 20   Temp:  98 F (36.7 C)  SpO2: 95%     Physical Exam Constitutional:      Appearance: She is well-developed. She is obese.     Comments: Husband at bedside  HENT:     Head: Atraumatic.  Eyes:     Conjunctiva/sclera: Conjunctivae normal.  Cardiovascular:     Rate and Rhythm: Tachycardia present.  Pulmonary:     Effort: No respiratory distress.  Abdominal:     General: There is no distension.  Musculoskeletal:        General: Normal range of motion.     Cervical back: Normal range of motion.     Comments: Right foot with a large wound to the plantar surface of the foot that appears to  have granulation tissue, no purulent drainage.  Amputation of the fifth digit.  Skin:    General: Skin is warm.  Neurological:     Mental Status: She is alert. Mental status is at baseline.     IMPRESSION / MDM / ASSESSMENT AND PLAN / ED COURSE  I reviewed the triage vital signs and the nursing notes.  Differential diagnosis including chronic wound, osteomyelitis  EKG  I, Nathaniel Man, the attending physician, personally viewed and interpreted this ECG.   Rate: Normal  Rhythm: Normal sinus  Axis: Normal  Intervals: Normal  ST&T Change: None  No tachycardic or bradycardic dysrhythmias while on cardiac telemetry.  RADIOLOGY I independently reviewed imaging, my interpretation of imaging: X-ray of the foot with bone changes.  Read as progression of bony destruction to the second through fourth metatarsals as well as the first metatarsal joint.  Consistent with osteomyelitis.  On chart review of outside records patient had previously  had findings of osteomyelitis to the fourth and fifth digits but I do not see that she has a history of osteomyelitis to the first digit.  Patient has been offered and recommended amputations multiple times by podiatry.  Patient has had multiple rounds of IV antibiotics for multiple weeks at a time and has been seen by infectious disease in the past.  LABS (all labs ordered are listed, but only abnormal results are displayed) Labs interpreted as -   No need for emergent dialysis. Labs Reviewed  COMPREHENSIVE METABOLIC PANEL - Abnormal; Notable for the following components:      Result Value   Sodium 132 (*)    Chloride 95 (*)    CO2 19 (*)    Glucose, Bld 240 (*)    BUN 65 (*)    Creatinine, Ser 8.83 (*)    Calcium 8.7 (*)    Alkaline Phosphatase 417 (*)    GFR, Estimated 5 (*)    Anion gap 18 (*)    All other components within normal limits  CBC WITH DIFFERENTIAL/PLATELET - Abnormal; Notable for the following components:   Hemoglobin 11.9  (*)    All other components within normal limits  RESP PANEL BY RT-PCR (RSV, FLU A&B, COVID)  RVPGX2  CULTURE, BLOOD (ROUTINE X 2)  CULTURE, BLOOD (ROUTINE X 2)  LACTIC ACID, PLASMA  LACTIC ACID, PLASMA  PROTIME-INR  SEDIMENTATION RATE  C-REACTIVE PROTEIN  BETA-HYDROXYBUTYRIC ACID  HIV ANTIBODY (ROUTINE TESTING W REFLEX)  HEMOGLOBIN A1C  PREALBUMIN  HEMOGLOBIN A1C  CBC  CREATININE, SERUM  POC URINE PREG, ED    TREATMENT    MDM    Patient admitted for osteomyelitis to the right foot.  Given that the patient otherwise does not have any findings concerning for sepsis and has normal lab work at this time do not feel that patient needs an antibiotics, will hold for possible culture.  Consulted hospitalist for admission.   PROCEDURES:  Critical Care performed: No  Procedures  [name] presentation is most consistent with acute presentation with potential threat to life or bodily function.   MEDICATIONS ORDERED IN ED: Medications  aspirin EC tablet 81 mg (has no administration in time range)  acetaminophen (TYLENOL) tablet 1,000 mg (has no administration in time range)  atorvastatin (LIPITOR) tablet 40 mg (has no administration in time range)  insulin degludec (TRESIBA) 100 UNIT/ML FlexTouch Pen 15 Units (has no administration in time range)  sevelamer carbonate (RENVELA) powder PACK 2.4 g (has no administration in time range)  albuterol (PROVENTIL) (2.5 MG/3ML) 0.083% nebulizer solution 2.5 mg (has no administration in time range)  budesonide (PULMICORT) nebulizer solution 0.5 mg (has no administration in time range)  acetaminophen (TYLENOL) tablet 650 mg (has no administration in time range)    Or  acetaminophen (TYLENOL) suppository 650 mg (has no administration in time range)  ondansetron (ZOFRAN) tablet 4 mg (has no administration in time range)    Or  ondansetron (ZOFRAN) injection 4 mg (has no administration in time range)  cefTRIAXone (ROCEPHIN) 2 g in sodium  chloride 0.9 % 100 mL IVPB (has no administration in time range)  metroNIDAZOLE (FLAGYL) tablet 500 mg (has no administration in time range)  insulin aspart (novoLOG) injection 0-6 Units (has no administration in time range)  heparin injection 5,000 Units (has no administration in time range)  oxyCODONE-acetaminophen (PERCOCET/ROXICET) 5-325 MG per tablet 1 tablet (1 tablet Oral Given 04/15/22 2208)    FINAL CLINICAL IMPRESSION(S) / ED DIAGNOSES   Final  diagnoses:  Acute hematogenous osteomyelitis of right foot ([place])     Rx / DC Orders   ED Discharge Orders     None        Note:  This document was prepared using Dragon voice recognition software and may include unintentional dictation errors.   Nathaniel Man, MD 04/15/22 [phone]

## 2022-04-15 NOTE — Assessment & Plan Note (Signed)
Complicating factor to overall prognosis and care 

## 2022-04-15 NOTE — ED Notes (Signed)
Pt is anuric.

## 2022-04-15 NOTE — Assessment & Plan Note (Signed)
Hold metoprolol, resume midodrine

## 2022-04-15 NOTE — Assessment & Plan Note (Signed)
Clinically euvolemic Continue metoprolol

## 2022-04-15 NOTE — Progress Notes (Signed)
Pharmacy Antibiotic Note  [name] is a 52 y.o. female w/ ESRD on TThSa HD, admitted on 04/15/2022 with Wound infection.  Pharmacy has been consulted for Vancomycin dosing for 7 days.  Plan: Pt given Vancomycin 2500 mg once. Vancomycin 1000 mg IV Q TThSa following dialysis session.  Pharmacy will continue to follow and will adjust abx dosing whenever warranted.  Temp (24hrs), Avg:98.6 F (37 C), Min:98 F (36.7 C), Max:99.1 F (37.3 C)   Recent Labs  Lab 04/15/22 1654 04/15/22 1925  WBC 8.7  --   CREATININE 8.83*  --   LATICACIDVEN 1.6 1.6    Estimated Creatinine Clearance: 9.9 mL/min (A) (by C-G formula based on SCr of 8.83 mg/dL (H)).    Allergies  Allergen Reactions   Dust Mite Extract Shortness Of Breath and Itching   Mold Extract [Trichophyton] Shortness Of Breath and Itching   Pollen Extract Shortness Of Breath    Itching, shortness of breath, asthma like symptoms   Ozempic (0.25 Or 0.5 Mg-Dose) [Semaglutide(0.25 Or 0.71m-Dos)] Other (See Comments)    pancreatitis   Sulfa Antibiotics Itching   Misc. Sulfonamide Containing Compounds    Feraheme [Ferumoxytol] Itching    Uses benadryl so she can get the medicine    Antimicrobials this admission: 2/19 Ceftriaxone >> x 7 days 2/19 Flagyl >> x 7 days 2/19 Vancomycin >> x 7 days  Microbiology results: 2/19 BCx: Pending  Thank you for allowing pharmacy to be a part of this patient's care.  NRenda Rolls PharmD, [facility]2/19/2024 11:45 PM

## 2022-04-16 ENCOUNTER — Encounter: Payer: Self-pay | Admitting: Internal Medicine

## 2022-04-16 ENCOUNTER — Inpatient Hospital Stay: Payer: 59

## 2022-04-16 DIAGNOSIS — N186 End stage renal disease: Secondary | ICD-10-CM

## 2022-04-16 DIAGNOSIS — M86171 Other acute osteomyelitis, right ankle and foot: Secondary | ICD-10-CM | POA: Diagnosis not present

## 2022-04-16 DIAGNOSIS — I5032 Chronic diastolic (congestive) heart failure: Secondary | ICD-10-CM | POA: Diagnosis not present

## 2022-04-16 DIAGNOSIS — J9612 Chronic respiratory failure with hypercapnia: Secondary | ICD-10-CM

## 2022-04-16 DIAGNOSIS — M86671 Other chronic osteomyelitis, right ankle and foot: Secondary | ICD-10-CM

## 2022-04-16 DIAGNOSIS — J9611 Chronic respiratory failure with hypoxia: Secondary | ICD-10-CM | POA: Diagnosis not present

## 2022-04-16 DIAGNOSIS — Z992 Dependence on renal dialysis: Secondary | ICD-10-CM

## 2022-04-16 LAB — GLUCOSE, CAPILLARY
Glucose-Capillary: 156 mg/dL — ABNORMAL HIGH (ref 70–99)
Glucose-Capillary: 158 mg/dL — ABNORMAL HIGH (ref 70–99)
Glucose-Capillary: 202 mg/dL — ABNORMAL HIGH (ref 70–99)
Glucose-Capillary: 205 mg/dL — ABNORMAL HIGH (ref 70–99)
Glucose-Capillary: 208 mg/dL — ABNORMAL HIGH (ref 70–99)

## 2022-04-16 LAB — CBC
HCT: 34.8 % — ABNORMAL LOW (ref 36.0–46.0)
Hemoglobin: 10.8 g/dL — ABNORMAL LOW (ref 12.0–15.0)
MCH: 29.8 pg (ref 26.0–34.0)
MCHC: 31 g/dL (ref 30.0–36.0)
MCV: 95.9 fL (ref 80.0–100.0)
Platelets: 268 10*3/uL (ref 150–400)
RBC: 3.63 MIL/uL — ABNORMAL LOW (ref 3.87–5.11)
RDW: 13.8 % (ref 11.5–15.5)
WBC: 8.5 10*3/uL (ref 4.0–10.5)
nRBC: 0 % (ref 0.0–0.2)

## 2022-04-16 LAB — RENAL FUNCTION PANEL
Albumin: 3.6 g/dL (ref 3.5–5.0)
Anion gap: 20 — ABNORMAL HIGH (ref 5–15)
BUN: 76 mg/dL — ABNORMAL HIGH (ref 6–20)
CO2: 19 mmol/L — ABNORMAL LOW (ref 22–32)
Calcium: 8.7 mg/dL — ABNORMAL LOW (ref 8.9–10.3)
Chloride: 91 mmol/L — ABNORMAL LOW (ref 98–111)
Creatinine, Ser: 9.9 mg/dL — ABNORMAL HIGH (ref 0.44–1.00)
GFR, Estimated: 4 mL/min — ABNORMAL LOW (ref >60)
Glucose, Bld: 236 mg/dL — ABNORMAL HIGH (ref 70–99)
Phosphorus: 9.2 mg/dL — ABNORMAL HIGH (ref 2.5–4.6)
Potassium: 3.9 mmol/L (ref 3.5–5.1)
Sodium: 130 mmol/L — ABNORMAL LOW (ref 135–145)

## 2022-04-16 LAB — HEPATITIS B SURFACE ANTIGEN: Hepatitis B Surface Ag: NONREACTIVE

## 2022-04-16 LAB — BETA-HYDROXYBUTYRIC ACID: Beta-Hydroxybutyric Acid: 0.34 mmol/L — ABNORMAL HIGH (ref 0.05–0.27)

## 2022-04-16 LAB — PREALBUMIN: Prealbumin: 18 mg/dL (ref 18–38)

## 2022-04-16 LAB — HIV ANTIBODY (ROUTINE TESTING W REFLEX): HIV Screen 4th Generation wRfx: NONREACTIVE

## 2022-04-16 LAB — HEMOGLOBIN A1C
Hgb A1c MFr Bld: 7.3 % — ABNORMAL HIGH (ref 4.8–5.6)
Mean Plasma Glucose: 162.81 mg/dL

## 2022-04-16 LAB — CBG MONITORING, ED: Glucose-Capillary: 256 mg/dL — ABNORMAL HIGH (ref 70–99)

## 2022-04-16 LAB — C-REACTIVE PROTEIN: CRP: 0.8 mg/dL (ref <1.0)

## 2022-04-16 MED ORDER — OXYCODONE-ACETAMINOPHEN 5-325 MG PO TABS
1.0000 | ORAL_TABLET | ORAL | Status: DC | PRN
  Administered 2022-04-16 – 2022-04-20 (×6): 1 via ORAL
  Filled 2022-04-16 (×6): qty 1

## 2022-04-16 MED ORDER — ACETAMINOPHEN 325 MG PO TABS
ORAL_TABLET | ORAL | Status: AC
  Filled 2022-04-16: qty 2

## 2022-04-16 MED ORDER — IPRATROPIUM-ALBUTEROL 0.5-2.5 (3) MG/3ML IN SOLN
3.0000 mL | Freq: Four times a day (QID) | RESPIRATORY_TRACT | Status: DC
  Administered 2022-04-16 – 2022-04-17 (×8): 3 mL via RESPIRATORY_TRACT
  Filled 2022-04-16 (×8): qty 3

## 2022-04-16 MED ORDER — SODIUM CHLORIDE 0.9 % IV SOLN: INTRAVENOUS | Status: DC

## 2022-04-16 MED ORDER — MIDODRINE HCL 5 MG PO TABS
ORAL_TABLET | ORAL | Status: AC
  Filled 2022-04-16: qty 2

## 2022-04-16 MED ORDER — DIPHENHYDRAMINE HCL 50 MG/ML IJ SOLN: 50.0000 mg | Freq: Once | INTRAMUSCULAR | Status: DC | PRN

## 2022-04-16 MED ORDER — LIDOCAINE HCL (PF) 1 % IJ SOLN: 5.0000 mL | INTRAMUSCULAR | Status: DC | PRN

## 2022-04-16 MED ORDER — SEVELAMER CARBONATE 800 MG PO TABS
2400.0000 mg | ORAL_TABLET | Freq: Three times a day (TID) | ORAL | Status: DC
  Administered 2022-04-16 – 2022-04-20 (×7): 2400 mg via ORAL
  Filled 2022-04-16 (×13): qty 3

## 2022-04-16 MED ORDER — CHLORHEXIDINE GLUCONATE CLOTH 2 % EX PADS
6.0000 | MEDICATED_PAD | Freq: Once | CUTANEOUS | Status: AC
  Administered 2022-04-16: 6 via TOPICAL

## 2022-04-16 MED ORDER — ANTICOAGULANT SODIUM CITRATE 4% (200MG/5ML) IV SOLN: 5.0000 mL | Status: DC | PRN

## 2022-04-16 MED ORDER — MIDODRINE HCL 5 MG PO TABS
10.0000 mg | ORAL_TABLET | Freq: Two times a day (BID) | ORAL | Status: DC
  Administered 2022-04-16 – 2022-04-20 (×8): 10 mg via ORAL
  Filled 2022-04-16 (×8): qty 2

## 2022-04-16 MED ORDER — ALBUTEROL SULFATE (2.5 MG/3ML) 0.083% IN NEBU
2.5000 mg | INHALATION_SOLUTION | RESPIRATORY_TRACT | Status: DC | PRN
  Administered 2022-04-19 – 2022-04-20 (×2): 2.5 mg via RESPIRATORY_TRACT
  Filled 2022-04-16 (×2): qty 3

## 2022-04-16 MED ORDER — JUVEN PO PACK
1.0000 | PACK | Freq: Two times a day (BID) | ORAL | Status: DC
  Administered 2022-04-18 – 2022-04-19 (×3): 1 via ORAL

## 2022-04-16 MED ORDER — RENA-VITE PO TABS
1.0000 | ORAL_TABLET | Freq: Every day | ORAL | Status: DC
  Administered 2022-04-16 – 2022-04-19 (×4): 1 via ORAL
  Filled 2022-04-16 (×4): qty 1

## 2022-04-16 MED ORDER — FAMOTIDINE 20 MG PO TABS: 40.0000 mg | ORAL_TABLET | Freq: Once | ORAL | Status: DC | PRN

## 2022-04-16 MED ORDER — MIDAZOLAM HCL 2 MG/ML PO SYRP: 8.0000 mg | ORAL_SOLUTION | Freq: Once | ORAL | Status: DC | PRN

## 2022-04-16 MED ORDER — CHLORHEXIDINE GLUCONATE CLOTH 2 % EX PADS
6.0000 | MEDICATED_PAD | Freq: Every day | CUTANEOUS | Status: DC
  Administered 2022-04-16 – 2022-04-20 (×5): 6 via TOPICAL

## 2022-04-16 MED ORDER — PENTAFLUOROPROP-TETRAFLUOROETH EX AERO: 1.0000 | INHALATION_SPRAY | CUTANEOUS | Status: DC | PRN

## 2022-04-16 MED ORDER — HEPARIN SODIUM (PORCINE) 1000 UNIT/ML DIALYSIS: 1000.0000 [IU] | INTRAMUSCULAR | Status: DC | PRN

## 2022-04-16 MED ORDER — METHYLPREDNISOLONE SODIUM SUCC 125 MG IJ SOLR: 125.0000 mg | Freq: Once | INTRAMUSCULAR | Status: DC | PRN

## 2022-04-16 MED ORDER — CEFAZOLIN SODIUM-DEXTROSE 2-4 GM/100ML-% IV SOLN: 2.0000 g | INTRAVENOUS | Status: DC

## 2022-04-16 MED ORDER — LIDOCAINE-PRILOCAINE 2.5-2.5 % EX CREA: 1.0000 | TOPICAL_CREAM | CUTANEOUS | Status: DC | PRN

## 2022-04-16 MED ORDER — ALTEPLASE 2 MG IJ SOLR: 2.0000 mg | Freq: Once | INTRAMUSCULAR | Status: DC | PRN

## 2022-04-16 MED ORDER — DIPHENHYDRAMINE HCL 25 MG PO CAPS
50.0000 mg | ORAL_CAPSULE | Freq: Three times a day (TID) | ORAL | Status: DC | PRN
  Administered 2022-04-16 – 2022-04-18 (×4): 50 mg via ORAL
  Filled 2022-04-16 (×4): qty 2

## 2022-04-16 MED ORDER — PROSOURCE PLUS PO LIQD
30.0000 mL | Freq: Two times a day (BID) | ORAL | Status: DC
  Administered 2022-04-17 – 2022-04-19 (×4): 30 mL via ORAL

## 2022-04-16 MED ORDER — VANCOMYCIN HCL IN DEXTROSE 1-5 GM/200ML-% IV SOLN
1000.0000 mg | INTRAVENOUS | Status: AC
  Administered 2022-04-16 – 2022-04-18 (×2): 1000 mg via INTRAVENOUS
  Filled 2022-04-16 (×3): qty 200

## 2022-04-16 MED ORDER — CHLORHEXIDINE GLUCONATE CLOTH 2 % EX PADS
6.0000 | MEDICATED_PAD | Freq: Once | CUTANEOUS | Status: AC
  Administered 2022-04-17: 6 via TOPICAL

## 2022-04-16 NOTE — Plan of Care (Signed)
  Problem: Clinical Measurements: Goal: Ability to maintain clinical measurements within normal limits will improve Outcome: Progressing   Problem: Clinical Measurements: Goal: Diagnostic test results will improve Outcome: Progressing   Problem: Clinical Measurements: Goal: Cardiovascular complication will be avoided Outcome: Progressing   Problem: Pain Managment: Goal: General experience of comfort will improve Outcome: Progressing   Problem: Safety: Goal: Ability to remain free from injury will improve Outcome: Progressing

## 2022-04-16 NOTE — Hospital Course (Signed)
52 y.o. female with medical history significant for HTN, COPD on 3-4 LO2, ESRD on HD TTS, type II DM , OSA, prior osteomyelitis right foot s/p fifth ray amputation w and with chronic nonhealing ulcers, followed at wound care center, last debrided on 2/12, sent to the ED with concerns for gangrene and Osteomyelitis of the foot   2/20: Nephrology, podiatry and vascular surgery consult.  Angiogram planned for tomorrow

## 2022-04-16 NOTE — Consult Note (Signed)
Hospital Consult    Reason for Consult:  Gangrene and Osteomyelitis of Right Foot.  Requesting Physician:  Dr Judd Gaudier MD.  MRN #:  [id number]  History of Present Illness: This is a 52 y.o. female [name] is a 52 y.o. female with medical history significant for HTN, COPD on 3-4 LO2, ESRD on HD TTS, type II DM , OSA, prior osteomyelitis right foot s/p fifth ray amputation w and with chronic nonhealing ulcers, followed at wound care center, last debrided on 2/12, sent to the ED with concerns for gangrene and Osteomyelitis of the foot.  Patient has been on multiple rounds of antibiotics but wound has not been healing.  Patient denies fever or chills But says she was feeling lethargic and achy. She said her wound card doctor noticed her wound was draining when she went to the wound clinic today.   On Exam today her right lower extremity/foot has gangrene to the previous amputation site into the tarsal area of the foot. It does present with an odor. Patient states she has had multiple ulcers on the bottom of her foot that with treatment have healed. She states she was told amputation may be the only solution but she wishes to try to reestablish blood flow. She denies any pain at this time although she does agree it is getting harder to walk.   Past Medical History:  Diagnosis Date   (HFpEF) heart failure with preserved ejection fraction ([place])    a. 11/2014 Echo: EF 50-55%, Gr1 DD; b. 06/2016 Echo: EF 60-65%, no rwma, mod dil LA, nl RV, triv eff.   Anxiety    Asthma    Bronchitis    CHF (congestive heart failure) (HCC)    Chronic cough    Chronic respiratory failure with hypoxia and hypercapnia ([place])    a. Home O2.  (2-4L)   CKD (chronic kidney disease), stage III (HCC)    Depression    Diabetes mellitus without complication (HCC)    Diverticulosis    Dyspnea    Dysrhythmia    per pt, no arrythmias   Environmental and seasonal allergies    Extreme obesity with alveolar  hypoventilation (HCC)    Hypertension    Iron deficiency anemia    a. chronic blood loss->heavy menses.   Kidney failure    dialysis tues/thur/sat   ... left arm shunt   Orthopnea    Oxygen dependent    3l/min   Pericardial effusion    a. 11/2014 CT Chest: Mod effusion; b. 11/2014 Echo: small to mod circumferential effusion. No hemodynamic compromise.   Pneumonia    history   Poorly controlled diabetes mellitus ([place])    a. 11/2014 A1c 13.2.   Swelling of both lower extremities     Past Surgical History:  Procedure Laterality Date   A/V FISTULAGRAM Left 06/09/2017   Procedure: A/V FISTULAGRAM;  Surgeon: Algernon Huxley, MD;  Location: [facility];  Service: Cardiovascular;  Laterality: Left;   A/V FISTULAGRAM Left 06/16/2017   Procedure: A/V FISTULAGRAM;  Surgeon: Algernon Huxley, MD;  Location: [facility];  Service: Cardiovascular;  Laterality: Left;   A/V FISTULAGRAM Left 01/28/2018   Procedure: A/V FISTULAGRAM;  Surgeon: Algernon Huxley, MD;  Location: [facility];  Service: Cardiovascular;  Laterality: Left;   AMPUTATION TOE Right 10/21/2020   Procedure: AMPUTATION TOE-5th Digit Amputation Right Foot;  Surgeon: Edrick Kins, DPM;  Location: [facility];  Service: Podiatry;  Laterality:  Right;   AV FISTULA PLACEMENT Left 04/25/2017   Procedure: ARTERIOVENOUS (AV) FISTULA CREATION;  Surgeon: Algernon Huxley, MD;  Location: [facility];  Service: Vascular;  Laterality: Left;   BONE BIOPSY Right 04/27/2021   Procedure: Right 4th metatarsal BONE BIOPSY;  Surgeon: [name], DPM;  Location: [facility];  Service: Podiatry;  Laterality: Right;   CATARACT EXTRACTION W/PHACO Left 01/21/2018   Procedure: CATARACT EXTRACTION PHACO AND INTRAOCULAR LENS PLACEMENT (IOC);  Surgeon: Leandrew Koyanagi, MD;  Location: [facility];  Service: Ophthalmology;  Laterality: Left;  [place] 01:55.5 CDE 9.05 EAUP 13.9 Fluid Pack Lot # [id number]   CESAREAN SECTION     times 3   DIALYSIS/PERMA CATHETER  INSERTION Right 04/04/2017   Procedure: DIALYSIS/PERMA CATHETER INSERTION;  Surgeon: Katha Cabal, MD;  Location: [facility];  Service: Cardiovascular;  Laterality: Right;   DIALYSIS/PERMA CATHETER REMOVAL N/A 09/17/2017   Procedure: DIALYSIS/PERMA CATHETER REMOVAL;  Surgeon: Algernon Huxley, MD;  Location: [facility];  Service: Cardiovascular;  Laterality: N/A;   INCISION AND DRAINAGE Right 10/26/2020   Procedure: INCISION AND DRAINAGE;  Surgeon: [name], DPM;  Location: [facility];  Service: Podiatry;  Laterality: Right;   INSERTION OF DIALYSIS CATHETER N/A 04/16/2017   Procedure: INSERTION OF DIALYSIS PERM CATHETER;  Surgeon: Algernon Huxley, MD;  Location: [facility];  Service: Vascular;  Laterality: N/A;   LOWER EXTREMITY ANGIOGRAPHY Right 10/18/2020   Procedure: Lower Extremity Angiography;  Surgeon: Algernon Huxley, MD;  Location: [facility];  Service: Cardiovascular;  Laterality: Right;    Allergies  Allergen Reactions   Dust Mite Extract Shortness Of Breath and Itching   Mold Extract [Trichophyton] Shortness Of Breath and Itching   Pollen Extract Shortness Of Breath    Itching, shortness of breath, asthma like symptoms   Ozempic (0.25 Or 0.5 Mg-Dose) [Semaglutide(0.25 Or 0.55m-Dos)] Other (See Comments)    pancreatitis   Sulfa Antibiotics Itching   Misc. Sulfonamide Containing Compounds    Feraheme [Ferumoxytol] Itching    Uses benadryl so she can get the medicine    Prior to Admission medications   Medication Sig Start Date End Date Taking? Authorizing Provider  acetaminophen (TYLENOL) 500 MG tablet Take 1,000 mg by mouth every 6 (six) hours as needed for mild pain, fever or headache.   Yes [provider]  albuterol (PROVENTIL) (2.5 MG/3ML) 0.083% nebulizer solution Take 3 mLs (2.5 mg total) by nebulization every 6 (six) hours as needed for wheezing or shortness of breath. 02/15/22  Yes GTyler Pita MD  albuterol (VENTOLIN HFA) 108 (90  Base) MCG/ACT inhaler Inhale 2 puffs into the lungs every 6 (six) hours as needed for wheezing or shortness of breath. 02/15/22  Yes GTyler Pita MD  aspirin EC 81 MG tablet Take 81 mg by mouth daily.    Yes [provider]  LINZESS 290 MCG CAPS capsule Take 290 mcg by mouth every morning. 10/10/20  Yes [provider]  midodrine (PROAMATINE) 10 MG tablet Take 20 mg by mouth daily as needed. 08/23/21  Yes [provider]  b complex-vitamin c-folic acid (NEPHRO-VITE) 0.8 MG TABS tablet Take 1 tablet by mouth at bedtime.    [provider]  budesonide (PULMICORT) 0.25 MG/2ML nebulizer solution Take 2 mLs (0.25 mg total) by nebulization 2 (two) times daily. 01/24/21   GTyler Pita MD  budesonide (PULMICORT) 0.5 MG/2ML nebulizer solution Take by nebulization. 04/17/21   [provider]  Cholecalciferol 100 MCG (4000 UT) CAPS Take 1 capsule by mouth once a week.    [provider]  clindamycin (CLEOCIN T) 1 % lotion Apply 1 application topically daily. 09/19/20   [provider]  ferric citrate (AURYXIA) 1 GM 210 MG(Fe) tablet Take 210 mg by mouth 3 (three) times daily with meals. Only taking once a day most days    [provider]  formoterol (PERFOROMIST) 20 MCG/2ML nebulizer solution  01/19/21   [provider]  insulin degludec (TRESIBA) 100 UNIT/ML FlexTouch Pen Inject 30 Units into the skin daily. 05/01/21   Richarda Osmond, MD  insulin lispro (HUMALOG) 100 UNIT/ML KwikPen Inject into the skin. 06/12/21   [provider]  lactulose (CHRONULAC) 10 GM/15ML solution Take 30 mLs (20 g total) by mouth daily as needed for severe constipation. 04/26/17   Epifanio Lesches, MD  lidocaine-prilocaine (EMLA) cream Apply topically as needed (For access for dialysis).    [provider]  meloxicam (MOBIC) 15 MG tablet Take 1 tablet (15 mg total) by mouth daily as needed for pain. 10/23/21   Minna Merritts, MD  metoprolol tartrate (LOPRESSOR) 50 MG tablet Take 1 tablet (50 mg total) by mouth 2 (two) times daily. Patient not taking: Reported on 02/27/2022 06/18/21   Minna Merritts, MD  montelukast (SINGULAIR) 10 MG tablet Take 1 tablet (10 mg total) by mouth at bedtime. 09/17/21   Tyler Pita, MD  Omeprazole Magnesium (PRILOSEC PO)     [provider]  OXYGEN Inhale 2-4 L into the lungs.    [provider]  pregabalin (LYRICA) 50 MG capsule Take 100 mg by mouth every 8 (eight) hours as needed. 08/08/21   [provider]  RENVELA 2.4 g PACK Take 2.4 g by mouth 3 (three) times daily. 08/07/21   [provider]  revefenacin Maretta Bees) 175 MCG/3ML nebulizer solution  01/19/21   [provider]    Social History   Socioeconomic History   Marital status: Married    Spouse name: abjul   Number of children: Not on file   Years of education: 12   Highest education level: High school graduate  Occupational History   Occupation: Disabled  Tobacco Use   Smoking status: Never    Passive exposure: Past   Smokeless tobacco: Never  Vaping Use   Vaping Use: Never used  Substance and Sexual Activity   Alcohol use: Not Currently    Comment: occasional   Drug use: Yes    Types: Marijuana   Sexual activity: Not Currently    Birth control/protection: None  Other Topics Concern   Not on file  Social History Narrative   Lives in [place] with her husband and 67 y/o child.  Three other children are grown and out of the house.  She is sedentary and does not routinely exercise.  On home O2.   Social Determinants of Health   Financial Resource Strain: High Risk (03/24/2017)   Overall Financial Resource Strain (CARDIA)    Difficulty of Paying Living Expenses: Very hard  Food Insecurity: No Food Insecurity (04/16/2022)   Hunger Vital Sign    Worried About Running Out of Food in the Last Year: Never true    Ran Out of Food in the Last Year: Never  true  Transportation Needs: No Transportation Needs (04/16/2022)   PRAPARE - Hydrologist (Medical): No    Lack of Transportation (Non-Medical): No  Physical Activity:  Inactive (03/24/2017)   Exercise Vital Sign    Days of Exercise per Week: 0 days    Minutes of Exercise per Session: 0 min  Stress: No Stress Concern Present (03/24/2017)   [place]    Feeling of Stress : Not at all  Social Connections: Somewhat Isolated (03/24/2017)   Social Connection and Isolation Panel [NHANES]    Frequency of Communication with Friends and Family: More than three times a week    Frequency of Social Gatherings with Friends and Family: Once a week    Attends Religious Services: Never    Marine scientist or Organizations: No    Attends Archivist Meetings: Never    Marital Status: Married  Human resources officer Violence: Not At Risk (04/16/2022)   Humiliation, Afraid, Rape, and Kick questionnaire    Fear of Current or Ex-Partner: No    Emotionally Abused: No    Physically Abused: No    Sexually Abused: No     Family History  Problem Relation Age of Onset   Diabetes Mother    Hypertension Mother    Hypertension Father    Diabetes Father    Hypertension Other    Diabetes Other    Breast cancer Maternal Aunt 50   Breast cancer Maternal Aunt 50    ROS: Otherwise negative unless mentioned in HPI  Physical Examination  Vitals:   04/16/22 0149 04/16/22 0203  BP: 107/70   Pulse: (!) 106   Resp: 18   Temp: 97.6 F (36.4 C)   SpO2: 99% 94%   Body mass index is 47.99 kg/m.  General:  WDWN in NAD Gait: Not observed HENT: WNL, normocephalic Pulmonary: normal non-labored breathing, without Rales, rhonchi,  wheezing Cardiac: regular, without  Murmurs, rubs or gallops; without carotid bruits Abdomen: Positive bowel sounds, soft, NT/ND, no masses Skin: without rashes Vascular  Exam/Pulses: Unable to palpate PT or DP pulses bilaterally  Extremities: with ischemic changes, with Gangrene , with cellulitis; with open wounds;  Musculoskeletal: no muscle wasting or atrophy  Neurologic: A&O X 3;  No focal weakness or paresthesias are detected; speech is fluent/normal Psychiatric:  The pt has Normal affect. Lymph:  Unremarkable  CBC    Component Value Date/Time   WBC 8.7 04/15/2022 1654   RBC 4.07 04/15/2022 1654   HGB 11.9 (L) 04/15/2022 1654   HGB 12.4 01/11/2014 1324   HCT 39.5 04/15/2022 1654   HCT 39.2 01/11/2014 1324   PLT 291 04/15/2022 1654   PLT 370 01/11/2014 1324   MCV 97.1 04/15/2022 1654   MCV 86 01/11/2014 1324   MCH 29.2 04/15/2022 1654   MCHC 30.1 04/15/2022 1654   RDW 13.8 04/15/2022 1654   RDW 15.1 (H) 01/11/2014 1324   LYMPHSABS 1.1 04/15/2022 1654   LYMPHSABS 1.8 01/11/2014 1324   MONOABS 0.6 04/15/2022 1654   MONOABS 0.5 01/11/2014 1324   EOSABS 0.1 04/15/2022 1654   EOSABS 0.2 01/11/2014 1324   BASOSABS 0.1 04/15/2022 1654   BASOSABS 0.1 01/11/2014 1324    BMET    Component Value Date/Time   NA 132 (L) 04/15/2022 1654   NA 135 (L) 11/21/2013 1153   K 3.8 04/15/2022 1654   K 4.1 11/21/2013 1153   CL 95 (L) 04/15/2022 1654   CL 98 11/21/2013 1153   CO2 19 (L) 04/15/2022 1654   CO2 30 11/21/2013 1153   GLUCOSE 240 (H) 04/15/2022 1654   GLUCOSE 287 (H) 11/21/2013  1153   BUN 65 (H) 04/15/2022 1654   BUN 16 11/21/2013 1153   CREATININE 8.83 (H) 04/15/2022 1654   CREATININE 0.85 11/21/2013 1153   CALCIUM 8.7 (L) 04/15/2022 1654   CALCIUM 8.7 11/21/2013 1153   GFRNONAA 5 (L) 04/15/2022 1654   GFRNONAA >60 11/21/2013 1153   GFRAA 15 (L) 04/26/2017 0609   GFRAA >60 11/21/2013 1153    COAGS: Lab Results  Component Value Date   INR 1.2 04/15/2022   INR 1.3 (H) 10/26/2020   INR 1.2 10/15/2020     Non-Invasive Vascular Imaging:   EXAM: RIGHT FOOT - 2 VIEW  IMPRESSION: 1. Previous fifth transmetatarsal amputation. 2.  Interval progression of bony destructive change involving the distal second third and fourth metatarsals as well as the second third and fourth proximal phalanges consistent with osteomyelitis. 3. Widening of the first MTP joint with erosive changes at the base of first proximal phalanx and head of first metatarsal consistent with septic joint and osteomyelitis. New erosive changes at the head of the first proximal phalanx also consistent with osteomyelitis.  Statin:  No. Beta Blocker:  Yes.   Aspirin:  Yes.   ACEI:  No. ARB:  No. CCB use:  No Other antiplatelets/anticoagulants:  No.    ASSESSMENT/PLAN: This is a 52 y.o. female with a history significant for  HTN, COPD on 3-4 LO2, ESRD on HD TTS, type II DM , OSA, prior osteomyelitis right foot s/p fifth ray amputation w and with chronic nonhealing ulcers, followed at wound care center, last debrided on 2/12, sent to the ED with concerns for gangrene and Osteomyelitis of the foot.   PLAN: Patient will need an angiogram with possible intervention to her right lower extremity to help preserve life and limb. She has gangrene to her foot with prior 5th digit amputation. I discussed the procedure, benefits, risks and complications. I answered all of her questions this morning. She verbalizes her understanding of my discussion with her. She wishes to proceed with the procedure as soon as possible. I changed her diet to let her eat and drink today but informed her she would be NPO after midnight for the procedure tomorrow. Labs were ordered to check her electrolytes and blood counts prior to the procedure tomorrow morning. Will evaluate and follow up accordingly.   Plan was discussed with Dr [name] MD and he is in agreement with the plan.     Drema Pry [facility] 04/16/2022 7:25 AM

## 2022-04-16 NOTE — Consult Note (Incomplete)
Pharmacy Antibiotic Note-Follow Up  [name] is a 52 y.o. female with a PMH of HTN, COPD, ESRD on HD, DM, and prior osteomyelitis on the right  foot. Admitted on 04/15/2022 with cellulitis.  Pharmacy has been consulted for Ceftriaxone, Vancomycin, and Metronidazole dosing.  Plan: Day 2   Continue Ceftriaxone 2g IV q 24 H per protocol Continue Vancomycin 1000 mg q T,Th,Sa with HD  Target 15-25 mcg/mL  Continue Metronidazole 500 mg q 8 H per protocol Monitor WBC, temp, any signs of fever   Height: 5' 4.5" (163.8 cm) Weight: 128.8 kg (283 lb 15.2 oz) IBW/kg (Calculated) : 55.85  Temp (24hrs), Avg:98.3 F (36.8 C), Min:97.6 F (36.4 C), Max:99.1 F (37.3 C)  Recent Labs  Lab 04/15/22 1654 04/15/22 1925  WBC 8.7  --   CREATININE 8.83*  --   LATICACIDVEN 1.6 1.6    Estimated Creatinine Clearance: 10.2 mL/min (A) (by C-G formula based on SCr of 8.83 mg/dL (H)).    Allergies  Allergen Reactions   Dust Mite Extract Shortness Of Breath and Itching   Mold Extract [Trichophyton] Shortness Of Breath and Itching   Pollen Extract Shortness Of Breath    Itching, shortness of breath, asthma like symptoms   Ozempic (0.25 Or 0.5 Mg-Dose) [Semaglutide(0.25 Or 0.23m-Dos)] Other (See Comments)    pancreatitis   Sulfa Antibiotics Itching   Misc. Sulfonamide Containing Compounds    Feraheme [Ferumoxytol] Itching    Uses benadryl so she can get the medicine    Antimicrobials this admission: 2/19 Ceftriaxone>> 2/19 Vancomycin >> 2/19 Metronidazole >>     Microbiology results: 2/19 BCX: NG <12 Hr   Thank you for allowing pharmacy to be a part of this patient's care.  SMirna Mires Pharmacy Student  04/16/2022 10:09 AM

## 2022-04-16 NOTE — H&P (View-Only) (Signed)
Hospital Consult    Reason for Consult:  Gangrene and Osteomyelitis of Right Foot.  Requesting Physician:  Dr Judd Gaudier MD.  MRN #:  [id number]  History of Present Illness: This is a 52 y.o. female [name] is a 52 y.o. female with medical history significant for HTN, COPD on 3-4 LO2, ESRD on HD TTS, type II DM , OSA, prior osteomyelitis right foot s/p fifth ray amputation w and with chronic nonhealing ulcers, followed at wound care center, last debrided on 2/12, sent to the ED with concerns for gangrene and Osteomyelitis of the foot.  Patient has been on multiple rounds of antibiotics but wound has not been healing.  Patient denies fever or chills But says she was feeling lethargic and achy. She said her wound card doctor noticed her wound was draining when she went to the wound clinic today.   On Exam today her right lower extremity/foot has gangrene to the previous amputation site into the tarsal area of the foot. It does present with an odor. Patient states she has had multiple ulcers on the bottom of her foot that with treatment have healed. She states she was told amputation may be the only solution but she wishes to try to reestablish blood flow. She denies any pain at this time although she does agree it is getting harder to walk.   Past Medical History:  Diagnosis Date   (HFpEF) heart failure with preserved ejection fraction ([place])    a. 11/2014 Echo: EF 50-55%, Gr1 DD; b. 06/2016 Echo: EF 60-65%, no rwma, mod dil LA, nl RV, triv eff.   Anxiety    Asthma    Bronchitis    CHF (congestive heart failure) (HCC)    Chronic cough    Chronic respiratory failure with hypoxia and hypercapnia ([place])    a. Home O2.  (2-4L)   CKD (chronic kidney disease), stage III (HCC)    Depression    Diabetes mellitus without complication (HCC)    Diverticulosis    Dyspnea    Dysrhythmia    per pt, no arrythmias   Environmental and seasonal allergies    Extreme obesity with alveolar  hypoventilation (HCC)    Hypertension    Iron deficiency anemia    a. chronic blood loss->heavy menses.   Kidney failure    dialysis tues/thur/sat   ... left arm shunt   Orthopnea    Oxygen dependent    3l/min   Pericardial effusion    a. 11/2014 CT Chest: Mod effusion; b. 11/2014 Echo: small to mod circumferential effusion. No hemodynamic compromise.   Pneumonia    history   Poorly controlled diabetes mellitus ([place])    a. 11/2014 A1c 13.2.   Swelling of both lower extremities     Past Surgical History:  Procedure Laterality Date   A/V FISTULAGRAM Left 06/09/2017   Procedure: A/V FISTULAGRAM;  Surgeon: Algernon Huxley, MD;  Location: [facility];  Service: Cardiovascular;  Laterality: Left;   A/V FISTULAGRAM Left 06/16/2017   Procedure: A/V FISTULAGRAM;  Surgeon: Algernon Huxley, MD;  Location: [facility];  Service: Cardiovascular;  Laterality: Left;   A/V FISTULAGRAM Left 01/28/2018   Procedure: A/V FISTULAGRAM;  Surgeon: Algernon Huxley, MD;  Location: [facility];  Service: Cardiovascular;  Laterality: Left;   AMPUTATION TOE Right 10/21/2020   Procedure: AMPUTATION TOE-5th Digit Amputation Right Foot;  Surgeon: Edrick Kins, DPM;  Location: [facility];  Service: Podiatry;  Laterality:  Right;   AV FISTULA PLACEMENT Left 04/25/2017   Procedure: ARTERIOVENOUS (AV) FISTULA CREATION;  Surgeon: Algernon Huxley, MD;  Location: [facility];  Service: Vascular;  Laterality: Left;   BONE BIOPSY Right 04/27/2021   Procedure: Right 4th metatarsal BONE BIOPSY;  Surgeon: [name], DPM;  Location: [facility];  Service: Podiatry;  Laterality: Right;   CATARACT EXTRACTION W/PHACO Left 01/21/2018   Procedure: CATARACT EXTRACTION PHACO AND INTRAOCULAR LENS PLACEMENT (IOC);  Surgeon: Leandrew Koyanagi, MD;  Location: [facility];  Service: Ophthalmology;  Laterality: Left;  [place] 01:55.5 CDE 9.05 EAUP 13.9 Fluid Pack Lot # [id number]   CESAREAN SECTION     times 3   DIALYSIS/PERMA CATHETER  INSERTION Right 04/04/2017   Procedure: DIALYSIS/PERMA CATHETER INSERTION;  Surgeon: Katha Cabal, MD;  Location: [facility];  Service: Cardiovascular;  Laterality: Right;   DIALYSIS/PERMA CATHETER REMOVAL N/A 09/17/2017   Procedure: DIALYSIS/PERMA CATHETER REMOVAL;  Surgeon: Algernon Huxley, MD;  Location: [facility];  Service: Cardiovascular;  Laterality: N/A;   INCISION AND DRAINAGE Right 10/26/2020   Procedure: INCISION AND DRAINAGE;  Surgeon: [name], DPM;  Location: [facility];  Service: Podiatry;  Laterality: Right;   INSERTION OF DIALYSIS CATHETER N/A 04/16/2017   Procedure: INSERTION OF DIALYSIS PERM CATHETER;  Surgeon: Algernon Huxley, MD;  Location: [facility];  Service: Vascular;  Laterality: N/A;   LOWER EXTREMITY ANGIOGRAPHY Right 10/18/2020   Procedure: Lower Extremity Angiography;  Surgeon: Algernon Huxley, MD;  Location: [facility];  Service: Cardiovascular;  Laterality: Right;    Allergies  Allergen Reactions   Dust Mite Extract Shortness Of Breath and Itching   Mold Extract [Trichophyton] Shortness Of Breath and Itching   Pollen Extract Shortness Of Breath    Itching, shortness of breath, asthma like symptoms   Ozempic (0.25 Or 0.5 Mg-Dose) [Semaglutide(0.25 Or 0.53m-Dos)] Other (See Comments)    pancreatitis   Sulfa Antibiotics Itching   Misc. Sulfonamide Containing Compounds    Feraheme [Ferumoxytol] Itching    Uses benadryl so she can get the medicine    Prior to Admission medications   Medication Sig Start Date End Date Taking? Authorizing Provider  acetaminophen (TYLENOL) 500 MG tablet Take 1,000 mg by mouth every 6 (six) hours as needed for mild pain, fever or headache.   Yes [provider]  albuterol (PROVENTIL) (2.5 MG/3ML) 0.083% nebulizer solution Take 3 mLs (2.5 mg total) by nebulization every 6 (six) hours as needed for wheezing or shortness of breath. 02/15/22  Yes GTyler Pita MD  albuterol (VENTOLIN HFA) 108 (90  Base) MCG/ACT inhaler Inhale 2 puffs into the lungs every 6 (six) hours as needed for wheezing or shortness of breath. 02/15/22  Yes GTyler Pita MD  aspirin EC 81 MG tablet Take 81 mg by mouth daily.    Yes [provider]  LINZESS 290 MCG CAPS capsule Take 290 mcg by mouth every morning. 10/10/20  Yes [provider]  midodrine (PROAMATINE) 10 MG tablet Take 20 mg by mouth daily as needed. 08/23/21  Yes [provider]  b complex-vitamin c-folic acid (NEPHRO-VITE) 0.8 MG TABS tablet Take 1 tablet by mouth at bedtime.    [provider]  budesonide (PULMICORT) 0.25 MG/2ML nebulizer solution Take 2 mLs (0.25 mg total) by nebulization 2 (two) times daily. 01/24/21   GTyler Pita MD  budesonide (PULMICORT) 0.5 MG/2ML nebulizer solution Take by nebulization. 04/17/21   [provider]  Cholecalciferol 100 MCG (4000 UT) CAPS Take 1 capsule by mouth once a week.    [provider]  clindamycin (CLEOCIN T) 1 % lotion Apply 1 application topically daily. 09/19/20   [provider]  ferric citrate (AURYXIA) 1 GM 210 MG(Fe) tablet Take 210 mg by mouth 3 (three) times daily with meals. Only taking once a day most days    [provider]  formoterol (PERFOROMIST) 20 MCG/2ML nebulizer solution  01/19/21   [provider]  insulin degludec (TRESIBA) 100 UNIT/ML FlexTouch Pen Inject 30 Units into the skin daily. 05/01/21   Richarda Osmond, MD  insulin lispro (HUMALOG) 100 UNIT/ML KwikPen Inject into the skin. 06/12/21   [provider]  lactulose (CHRONULAC) 10 GM/15ML solution Take 30 mLs (20 g total) by mouth daily as needed for severe constipation. 04/26/17   Epifanio Lesches, MD  lidocaine-prilocaine (EMLA) cream Apply topically as needed (For access for dialysis).    [provider]  meloxicam (MOBIC) 15 MG tablet Take 1 tablet (15 mg total) by mouth daily as needed for pain. 10/23/21   Minna Merritts, MD  metoprolol tartrate (LOPRESSOR) 50 MG tablet Take 1 tablet (50 mg total) by mouth 2 (two) times daily. Patient not taking: Reported on 02/27/2022 06/18/21   Minna Merritts, MD  montelukast (SINGULAIR) 10 MG tablet Take 1 tablet (10 mg total) by mouth at bedtime. 09/17/21   Tyler Pita, MD  Omeprazole Magnesium (PRILOSEC PO)     [provider]  OXYGEN Inhale 2-4 L into the lungs.    [provider]  pregabalin (LYRICA) 50 MG capsule Take 100 mg by mouth every 8 (eight) hours as needed. 08/08/21   [provider]  RENVELA 2.4 g PACK Take 2.4 g by mouth 3 (three) times daily. 08/07/21   [provider]  revefenacin Maretta Bees) 175 MCG/3ML nebulizer solution  01/19/21   [provider]    Social History   Socioeconomic History   Marital status: Married    Spouse name: abjul   Number of children: Not on file   Years of education: 12   Highest education level: High school graduate  Occupational History   Occupation: Disabled  Tobacco Use   Smoking status: Never    Passive exposure: Past   Smokeless tobacco: Never  Vaping Use   Vaping Use: Never used  Substance and Sexual Activity   Alcohol use: Not Currently    Comment: occasional   Drug use: Yes    Types: Marijuana   Sexual activity: Not Currently    Birth control/protection: None  Other Topics Concern   Not on file  Social History Narrative   Lives in [place] with her husband and 8 y/o child.  Three other children are grown and out of the house.  She is sedentary and does not routinely exercise.  On home O2.   Social Determinants of Health   Financial Resource Strain: High Risk (03/24/2017)   Overall Financial Resource Strain (CARDIA)    Difficulty of Paying Living Expenses: Very hard  Food Insecurity: No Food Insecurity (04/16/2022)   Hunger Vital Sign    Worried About Running Out of Food in the Last Year: Never true    Ran Out of Food in the Last Year: Never  true  Transportation Needs: No Transportation Needs (04/16/2022)   PRAPARE - Hydrologist (Medical): No    Lack of Transportation (Non-Medical): No  Physical Activity:  Inactive (03/24/2017)   Exercise Vital Sign    Days of Exercise per Week: 0 days    Minutes of Exercise per Session: 0 min  Stress: No Stress Concern Present (03/24/2017)   [place]    Feeling of Stress : Not at all  Social Connections: Somewhat Isolated (03/24/2017)   Social Connection and Isolation Panel [NHANES]    Frequency of Communication with Friends and Family: More than three times a week    Frequency of Social Gatherings with Friends and Family: Once a week    Attends Religious Services: Never    Marine scientist or Organizations: No    Attends Archivist Meetings: Never    Marital Status: Married  Human resources officer Violence: Not At Risk (04/16/2022)   Humiliation, Afraid, Rape, and Kick questionnaire    Fear of Current or Ex-Partner: No    Emotionally Abused: No    Physically Abused: No    Sexually Abused: No     Family History  Problem Relation Age of Onset   Diabetes Mother    Hypertension Mother    Hypertension Father    Diabetes Father    Hypertension Other    Diabetes Other    Breast cancer Maternal Aunt 50   Breast cancer Maternal Aunt 50    ROS: Otherwise negative unless mentioned in HPI  Physical Examination  Vitals:   04/16/22 0149 04/16/22 0203  BP: 107/70   Pulse: (!) 106   Resp: 18   Temp: 97.6 F (36.4 C)   SpO2: 99% 94%   Body mass index is 47.99 kg/m.  General:  WDWN in NAD Gait: Not observed HENT: WNL, normocephalic Pulmonary: normal non-labored breathing, without Rales, rhonchi,  wheezing Cardiac: regular, without  Murmurs, rubs or gallops; without carotid bruits Abdomen: Positive bowel sounds, soft, NT/ND, no masses Skin: without rashes Vascular  Exam/Pulses: Unable to palpate PT or DP pulses bilaterally  Extremities: with ischemic changes, with Gangrene , with cellulitis; with open wounds;  Musculoskeletal: no muscle wasting or atrophy  Neurologic: A&O X 3;  No focal weakness or paresthesias are detected; speech is fluent/normal Psychiatric:  The pt has Normal affect. Lymph:  Unremarkable  CBC    Component Value Date/Time   WBC 8.7 04/15/2022 1654   RBC 4.07 04/15/2022 1654   HGB 11.9 (L) 04/15/2022 1654   HGB 12.4 01/11/2014 1324   HCT 39.5 04/15/2022 1654   HCT 39.2 01/11/2014 1324   PLT 291 04/15/2022 1654   PLT 370 01/11/2014 1324   MCV 97.1 04/15/2022 1654   MCV 86 01/11/2014 1324   MCH 29.2 04/15/2022 1654   MCHC 30.1 04/15/2022 1654   RDW 13.8 04/15/2022 1654   RDW 15.1 (H) 01/11/2014 1324   LYMPHSABS 1.1 04/15/2022 1654   LYMPHSABS 1.8 01/11/2014 1324   MONOABS 0.6 04/15/2022 1654   MONOABS 0.5 01/11/2014 1324   EOSABS 0.1 04/15/2022 1654   EOSABS 0.2 01/11/2014 1324   BASOSABS 0.1 04/15/2022 1654   BASOSABS 0.1 01/11/2014 1324    BMET    Component Value Date/Time   NA 132 (L) 04/15/2022 1654   NA 135 (L) 11/21/2013 1153   K 3.8 04/15/2022 1654   K 4.1 11/21/2013 1153   CL 95 (L) 04/15/2022 1654   CL 98 11/21/2013 1153   CO2 19 (L) 04/15/2022 1654   CO2 30 11/21/2013 1153   GLUCOSE 240 (H) 04/15/2022 1654   GLUCOSE 287 (H) 11/21/2013  1153   BUN 65 (H) 04/15/2022 1654   BUN 16 11/21/2013 1153   CREATININE 8.83 (H) 04/15/2022 1654   CREATININE 0.85 11/21/2013 1153   CALCIUM 8.7 (L) 04/15/2022 1654   CALCIUM 8.7 11/21/2013 1153   GFRNONAA 5 (L) 04/15/2022 1654   GFRNONAA >60 11/21/2013 1153   GFRAA 15 (L) 04/26/2017 0609   GFRAA >60 11/21/2013 1153    COAGS: Lab Results  Component Value Date   INR 1.2 04/15/2022   INR 1.3 (H) 10/26/2020   INR 1.2 10/15/2020     Non-Invasive Vascular Imaging:   EXAM: RIGHT FOOT - 2 VIEW  IMPRESSION: 1. Previous fifth transmetatarsal amputation. 2.  Interval progression of bony destructive change involving the distal second third and fourth metatarsals as well as the second third and fourth proximal phalanges consistent with osteomyelitis. 3. Widening of the first MTP joint with erosive changes at the base of first proximal phalanx and head of first metatarsal consistent with septic joint and osteomyelitis. New erosive changes at the head of the first proximal phalanx also consistent with osteomyelitis.  Statin:  No. Beta Blocker:  Yes.   Aspirin:  Yes.   ACEI:  No. ARB:  No. CCB use:  No Other antiplatelets/anticoagulants:  No.    ASSESSMENT/PLAN: This is a 52 y.o. female with a history significant for  HTN, COPD on 3-4 LO2, ESRD on HD TTS, type II DM , OSA, prior osteomyelitis right foot s/p fifth ray amputation w and with chronic nonhealing ulcers, followed at wound care center, last debrided on 2/12, sent to the ED with concerns for gangrene and Osteomyelitis of the foot.   PLAN: Patient will need an angiogram with possible intervention to her right lower extremity to help preserve life and limb. She has gangrene to her foot with prior 5th digit amputation. I discussed the procedure, benefits, risks and complications. I answered all of her questions this morning. She verbalizes her understanding of my discussion with her. She wishes to proceed with the procedure as soon as possible. I changed her diet to let her eat and drink today but informed her she would be NPO after midnight for the procedure tomorrow. Labs were ordered to check her electrolytes and blood counts prior to the procedure tomorrow morning. Will evaluate and follow up accordingly.   Plan was discussed with Dr [name] MD and he is in agreement with the plan.     Drema Pry [facility] 04/16/2022 7:25 AM

## 2022-04-16 NOTE — Progress Notes (Signed)
PT Cancellation Note  Patient Details Name: [name] MRN: [id number] DOB: 08-15-1970   Cancelled Treatment:    Reason Eval/Treat Not Completed: Patient at procedure or test/unavailable (Chart reviewed, RN consulted. Pt just went OTF for HD.) Will attempt evaluation again at later date time.   2:46 PM, 04/16/22 Etta Grandchild, PT, DPT Physical Therapist - [facility]  [phone] ([place])     Augusta C 04/16/2022, 2:46 PM

## 2022-04-16 NOTE — Assessment & Plan Note (Addendum)
Obesity hypoventilation syndrome Chronic respiratory failure with hypoxia Mild exacerbation Scheduled and prn nebulized treatments Continue home inhalers Continue supplemental oxygen and wean as tolerated.

## 2022-04-16 NOTE — Assessment & Plan Note (Signed)
On 3 to 4 L oxygen at baseline

## 2022-04-16 NOTE — Consult Note (Signed)
PODIATRY CONSULTATION  NAME [name] MRN [id number] DOB 19-May-1970 DOA 04/15/2022   Reason for consult: Osteomyelitis right foot Chief Complaint  Patient presents with   Foot Pain    Consulting physician: Judd Gaudier, MD, [facility]  History of present illness: 52 y.o. female PMHx T2DM, history of prior osteomyelitis S/P fifth ray amputation with nonhealing ulcers followed at the wound care center admitted for worsening infection and osteomyelitis of the right forefoot.  Patient has failed multiple rounds of antibiotics.  Upon admission x-rays demonstrate progressive erosive changes throughout the forefoot.  Podiatry consulted.  Past Medical History:  Diagnosis Date   (HFpEF) heart failure with preserved ejection fraction ([place])    a. 11/2014 Echo: EF 50-55%, Gr1 DD; b. 06/2016 Echo: EF 60-65%, no rwma, mod dil LA, nl RV, triv eff.   Anxiety    Asthma    Bronchitis    CHF (congestive heart failure) (HCC)    Chronic cough    Chronic respiratory failure with hypoxia and hypercapnia ([place])    a. Home O2.  (2-4L)   CKD (chronic kidney disease), stage III (HCC)    Depression    Diabetes mellitus without complication (HCC)    Diverticulosis    Dyspnea    Dysrhythmia    per pt, no arrythmias   Environmental and seasonal allergies    Extreme obesity with alveolar hypoventilation (HCC)    Hypertension    Iron deficiency anemia    a. chronic blood loss->heavy menses.   Kidney failure    dialysis tues/thur/sat   ... left arm shunt   Orthopnea    Oxygen dependent    3l/min   Pericardial effusion    a. 11/2014 CT Chest: Mod effusion; b. 11/2014 Echo: small to mod circumferential effusion. No hemodynamic compromise.   Pneumonia    history   Poorly controlled diabetes mellitus ([place])    a. 11/2014 A1c 13.2.   Swelling of both lower extremities        Latest Ref Rng & Units 04/15/2022    4:54 PM 12/02/2021    6:01 PM 06/12/2021   12:08 PM  CBC  WBC 4.0 - 10.5  K/uL 8.7  11.3  5.6   Hemoglobin 12.0 - 15.0 g/dL 11.9  12.8  11.8   Hematocrit 36.0 - 46.0 % 39.5  41.1  37.6   Platelets 150 - 400 K/uL 291  308  250        Latest Ref Rng & Units 04/15/2022    4:54 PM 12/02/2021    6:01 PM 05/01/2021    5:31 AM  BMP  Glucose 70 - 99 mg/dL 240  124  224   BUN 6 - 20 mg/dL 65  62  66   Creatinine 0.44 - 1.00 mg/dL 8.83  8.95  10.09   Sodium 135 - 145 mmol/L 132  133  128   Potassium 3.5 - 5.1 mmol/L 3.8  4.1  5.3   Chloride 98 - 111 mmol/L 95  92  91   CO2 22 - 32 mmol/L 19  23  21   $ Calcium 8.9 - 10.3 mg/dL 8.7  8.9  9.3     Physical Exam: General: The patient is alert and oriented x3 in no acute distress.   Dermatology: Nonhealing ulcer right forefoot  Vascular: Peripheral vascular disease.  Last lower extremity angiography 10/18/2020 by Dr. [name], [facility].  Findings:  Aortogram: Normal aorta and iliac arteries without significant stenosis             Right lower Extremity: Normal common femoral artery, profunda femoris artery, and superficial femoral artery.  The popliteal artery had a mild lesion of 30% or less just below the knee.  There was a normal tibial trifurcation with three-vessel runoff with both the anterior tibial and posterior tibial arteries being continuous to the foot without focal stenosis.  Neurological: Sensation diminished via light touch  Musculoskeletal Exam: Prior amputation partial fifth ray right foot  Radiographic exam RT foot 04/15/2022: IMPRESSION: 1. Previous fifth transmetatarsal amputation. 2. Interval progression of bony destructive change involving the distal second third and fourth metatarsals as well as the second third and fourth proximal phalanges consistent with osteomyelitis. 3. Widening of the first MTP joint with erosive changes at the base of first proximal phalanx and head of first metatarsal consistent with septic joint and osteomyelitis. New erosive changes at  the head of the first proximal phalanx also consistent with osteomyelitis.  ASSESSMENT/PLAN OF CARE Progressive erosive osteomyelitis right forefoot -Patient evaluated -MRI ordered to better visualize the extent of the osteomyelitis and determine if it is isolated to the forefoot, or how proximal it has progressed -Plan for lower extremity angiography tomorrow, 04/17/2022 -Continue Rocephin and vancomycin as ordered upon admission -Patient will require some level of amputation of the right forefoot.  I do not suspect that she will be amenable to this plan however we will discuss this in further detail after completion of the MRI -Podiatry will follow     Thank you for the consult.  Please contact me directly via secure chat with any questions or concerns.     Edrick Kins, DPM [facility]  Dr. Edrick Kins, DPM    [address], [place] [address]                Office [phone]  Fax [phone]

## 2022-04-16 NOTE — Progress Notes (Signed)
Initial Nutrition Assessment  DOCUMENTATION CODES:   Morbid obesity  INTERVENTION:   -Double protein portions with meals -1 packet Juven BID, each packet provides 95 calories, 2.5 grams of protein (collagen), and 9.8 grams of carbohydrate (3 grams sugar); also contains 7 grams of L-arginine and L-glutamine, 300 mg vitamin C, 15 mg vitamin E, 1.2 mcg vitamin B-12, 9.5 mg zinc, 200 mg calcium, and 1.5 g  Calcium Beta-hydroxy-Beta-methylbutyrate to support wound healing  -30 ml Prosource Plus BID, each supplement provides 100 kcals and 15 grams protein -Renal MVI daily  NUTRITION DIAGNOSIS:   Increased nutrient needs related to wound healing as evidenced by estimated needs.  GOAL:   Patient will meet greater than or equal to 90% of their needs  MONITOR:   PO intake, Supplement acceptance  REASON FOR ASSESSMENT:   Consult Wound healing  ASSESSMENT:   Pt with medical history significant for HTN, COPD on 3-4 LO2, ESRD on HD TTS, type II DM , OSA, prior osteomyelitis right foot s/p fifth ray amputation w and with chronic nonhealing ulcers, followed at wound care center, last debrided on 2/12, sent with concerns for gangrene and Osteomyelitis of the foot.  Pt admitted with osteomyelitis of rt foot.   Reviewed I/O's: +592 ml x 24 hours   Per vascular surgery notes, plan for angiogram tomorrow.   Spoke with pt at bedside, who was pleasant and in good spirits today. She reports good appetite, consumed 100% of her omelette today. Pt has a good appetite at home and consumes 3 meals per day. Per pt, she admits that she often struggles with her high Phos levels and often forgets to take her binders. Pt also has intermittent constipation. Pt admits that she has been trying to simply her medication regimen, as she often forgets to take medications as "it is a lot to keep up with".   Pt shares that she has been on HD for 4 years and is well known to her home HD clinic. Pt has been considering  weight loss surgery in order to help her qualify for a kidney transplant. RD explained to pt that weight loss surgery, like another weight loss tool, is a tool and discussed the long term after care of weight loss surgery, such as taking daily vitamins due to increased risk of malabsorption from surgery. Emotional support provided.   Pt shares with this RD that she developed a wound on her 5th toe approximately a year ago, which required amputation. She has developed other lesions on her heel, between her toes, and top of her foot, which has been followed by the wound center. Per pt, heel wound has improved with weekly debridements. Pt verbalizes that she knows that she is at risk for amputation and shares with this RD how foot wounds have impacted her mobility (has been wheelchair bound for 3 months) as well as weight loss efforts. She also verbalizes risk factors such as DM and vascular disease as barriers to wound healing and is hopeful that angiogram will be helpful to target future steps.   Discussed importance of good meal and supplement intake to promote healing. Discussed role in improved glycemic control to also help support wound healing. Pt amenable to supplements. Pt expressed appreciation for visit.   Reviewed wt hx; no wt loss noted over the past 4 months.   Obesity is a complex, chronic medical condition that is optimally managed by a multidisciplinary care team. Weight loss is not an ideal goal for an acute  inpatient hospitalization. However, if further work-up for obesity is warranted, consider outpatient referral to [employer] Nutrition and Diabetes Education [facility].    Medications reviewed and include renvela.  Lab Results  Component Value Date   HGBA1C 7.3 (H) 04/16/2022   PTA DM medications are 30 units insulin degludec daily.   Labs reviewed: CBGS: 202-256 (inpatient orders for glycemic control are 0-6 units insulin aspart every 4 hours and 15 units insulin glargine-yfgn  daily).    NUTRITION - FOCUSED PHYSICAL EXAM:  Flowsheet Row Most Recent Value  Orbital Region No depletion  Upper Arm Region No depletion  Thoracic and Lumbar Region No depletion  Buccal Region No depletion  Temple Region No depletion  Clavicle Bone Region No depletion  Clavicle and Acromion Bone Region No depletion  Scapular Bone Region No depletion  Dorsal Hand No depletion  Patellar Region No depletion  Anterior Thigh Region No depletion  Posterior Calf Region No depletion  Edema (RD Assessment) Mild  Hair Reviewed  Eyes Reviewed  Mouth Reviewed  Skin Reviewed  Nails Reviewed       Diet Order:   Diet Order             Diet renal with fluid restriction Fluid restriction: 2000 mL Fluid; Room service appropriate? Yes; Fluid consistency: Thin  Diet effective now                   EDUCATION NEEDS:   Education needs have been addressed  Skin:  Skin Assessment: Reviewed RN Assessment  Last BM:  04/14/22  Height:   Ht Readings from Last 1 Encounters:  04/16/22 5' 4.5" (1.638 m)    Weight:   Wt Readings from Last 1 Encounters:  04/16/22 124.1 kg    Ideal Body Weight:  55.7 kg  BMI:  Body mass index is 46.24 kg/m.  Estimated Nutritional Needs:   Kcal:  1950-2150  Protein:  10-125 grams  Fluid:  1000 ml + UOP    Loistine Chance, RD, LDN, [place] Registered Dietitian II Certified Diabetes Care and Education Specialist Please refer to [facility] for RD and/or RD on-call/weekend/after hours pager

## 2022-04-16 NOTE — Progress Notes (Signed)
  Progress Note   Patient: [name] [id number] DOB: 07/29/1970 DOA: 04/15/2022     1 DOS: the patient was seen and examined on 04/16/2022   Brief hospital course: 52 y.o. female with medical history significant for HTN, COPD on 3-4 LO2, ESRD on HD TTS, type II DM , OSA, prior osteomyelitis right foot s/p fifth ray amputation w and with chronic nonhealing ulcers, followed at wound care center, last debrided on 2/12, sent to the ED with concerns for gangrene and Osteomyelitis of the foot   2/20: Nephrology, podiatry and vascular surgery consult.  Angiogram planned for tomorrow  Assessment and Plan: * Osteomyelitis of right foot ([place]) Peripheral vascular disease s/p prior fifths with amputation right foot Continue Flagyl, Rocephin and vancomycin Podiatry and vascular consults.  Vascular surgery planning RLE angiogram tomorrow Continue aspirin and atorvastatin Will keep n.p.o. from midnight  ESRD on hemodialysis ([place]) Anion gap metabolic acidosis Patient with bicarb of 19 and anion gap of 18, unclear etiology.  DKA not suspected Continue to monitor Expecting improvement with dialysis  Uncontrolled type 2 diabetes mellitus with hyperglycemia, with long-term current use of insulin (HCC) Continue basal insulin with sliding scale coverage  Chronic diastolic heart failure (HCC) Clinically euvolemic   COPD exacerbation (HCC) Obesity hypoventilation syndrome Chronic respiratory failure with hypoxia Mild exacerbation Scheduled and prn nebulized treatments Continue home inhalers Continue supplemental oxygen and wean as tolerated.  Hypertension Hold metoprolol, resume midodrine  PVD (peripheral vascular disease) [facility]) Vascular surgery planning angiogram tomorrow.  Continue aspirin and statin  Chronic respiratory failure with hypoxia and hypercapnia (HCC) On 3 to 4 L oxygen at baseline  Obesity, Class III, BMI 40-49.9 (morbid obesity) ([name]) Complicating factor to overall  prognosis and care.        Subjective: Wanting something stronger for pain control  Physical Exam: Vitals:   04/16/22 0828 04/16/22 0859 04/16/22 0900 04/16/22 0911  BP: 108/84 (!) 95/51 (!) 110/53 (!) 110/53  Pulse: (!) 102 (!) 106 (!) 102 (!) 102  Resp:    17  Temp: 98.2 F (36.8 C)     TempSrc: Oral     SpO2: 100% 100%  100%  Weight:      Height:       52 year old female lying in the bed comfortably without any acute distress Lungs clear to auscultation bilaterally Heart regular rate and rhythm Abdomen soft, obese, benign Neuro alert and awake, nonfocal Right foot: She has a large wound to the plantar surface with granulation tissue, no purulent drainage, some necrotic tissue on the distal portion of the foot, amputation of the fifth digit  Data Reviewed:  There are no new results to review at this time.  Family Communication: None at bedside  Disposition: Status is: Inpatient Remains inpatient appropriate because: Right foot angiogram tomorrow  Planned Discharge Destination: Home with Home Health   DVT prophylaxis-heparin Time spent: 35 minutes  Author: Max Sane, MD 04/16/2022 1:24 PM  For on call review [email or web address].

## 2022-04-16 NOTE — Assessment & Plan Note (Signed)
Vascular surgery planning angiogram tomorrow.  Continue aspirin and statin

## 2022-04-16 NOTE — Progress Notes (Signed)
[facility]  ROUNDING NOTE   Subjective:   [name] is a 52 year old African-American female with a past medical history of end-stage renal disease, patient is on Tuesday Thursday Saturday schedule, diabetes mellitus, history of pancreatitis who came to the ER with chief complaint of foot pain. She has been admitted for Osteomyelitis of right foot ([place]) [M86.9] Acute hematogenous osteomyelitis of right foot [place]) [M86.071]  Patient is known to our clinic and receives outpatient dialysis treatments at [facility] on a TTS schedule, supervised by Dr Candiss Norse. Last treatment was received on Saturday. Patient has been managing a chronic wound on her right foot for multiple months. States pain has increased and there's a subtle odor. States she has maintained all medications and dressing changes. Her husband is available to assist with dressing changes at home.   Labs on ED arrival include sodium 132, bicarb 19, glucose 240, BUN 65, and creatinine 8.83 with GFR 5. Hgb 11.9. Respiratory panel negative for RSV, Covid 19, and Flu. Chest xray shows vascular congestion. Right foot xray shows osteomyelitis.   We have been consulted to manage dialysis needs during this admission.    Objective:  Vital signs in last 24 hours:  Temp:  [97.6 F (36.4 C)-99.1 F (37.3 C)] 98.2 F (36.8 C) (02/20 0828) Pulse Rate:  [102-108] 102 (02/20 0911) Resp:  [17-20] 17 (02/20 0911) BP: (93-110)/(47-84) 110/53 (02/20 0911) SpO2:  [92 %-100 %] 100 % (02/20 0911) Weight:  [123.8 kg-128.8 kg] 128.8 kg (02/20 0149)  Weight change:  Filed Weights   04/15/22 1638 04/16/22 0149  Weight: 123.8 kg 128.8 kg    Intake/Output: I/O last 3 completed shifts: In: 592.3 [IV Piggyback:592.3] Out: -    Intake/Output this shift:  Total I/O In: 7.7 [IV Piggyback:7.7] Out: -   Physical Exam: General: NAD  Head: Normocephalic, atraumatic. Moist oral mucosal membranes  Eyes: Anicteric  Lungs:  Clear  to auscultation, normal effort  Heart: Regular rate and rhythm  Abdomen:  Soft, nontender  Extremities:  trace peripheral edema. Right foot ace bandages  Neurologic: Alert and oriented, moving all four extremities  Skin: No lesions  Access: Lt AVF    Basic Metabolic Panel: Recent Labs  Lab 04/15/22 1654  NA 132*  K 3.8  CL 95*  CO2 19*  GLUCOSE 240*  BUN 65*  CREATININE 8.83*  CALCIUM 8.7*    Liver Function Tests: Recent Labs  Lab 04/15/22 1654  AST 21  ALT 22  ALKPHOS 417*  BILITOT 0.9  PROT 7.4  ALBUMIN 3.8   No results for input(s): "LIPASE", "AMYLASE" in the last 168 hours. No results for input(s): "AMMONIA" in the last 168 hours.  CBC: Recent Labs  Lab 04/15/22 1654  WBC 8.7  NEUTROABS 6.9  HGB 11.9*  HCT 39.5  MCV 97.1  PLT 291    Cardiac Enzymes: No results for input(s): "CKTOTAL", "CKMB", "CKMBINDEX", "TROPONINI" in the last 168 hours.  BNP: Invalid input(s): "POCBNP"  CBG: Recent Labs  Lab 04/16/22 0043 04/16/22 0349 04/16/22 0520 04/16/22 1226  GLUCAP 256* 202* 205* 156*    Microbiology: Results for orders placed or performed during the hospital encounter of 04/15/22  Culture, blood (Routine x 2)     Status: None (Preliminary result)   Collection Time: 04/15/22  4:54 PM   Specimen: BLOOD  Result Value Ref Range Status   Specimen Description BLOOD LEFT ANTECUBITAL  Final   Special Requests   Final    BOTTLES DRAWN AEROBIC  AND ANAEROBIC Blood Culture adequate volume   Culture   Final    NO GROWTH < 12 HOURS Performed at [facility], [address] [place], [place] [address]    Report Status PENDING  Incomplete  Culture, blood (Routine x 2)     Status: None (Preliminary result)   Collection Time: 04/15/22  7:24 PM   Specimen: BLOOD  Result Value Ref Range Status   Specimen Description BLOOD RIGHT ANTECUBITAL  Final   Special Requests   Final    BOTTLES DRAWN AEROBIC AND ANAEROBIC Blood Culture adequate volume    Culture   Final    NO GROWTH < 12 HOURS Performed at [facility], [address], [place], [place] [address]    Report Status PENDING  Incomplete  Resp panel by RT-PCR (RSV, Flu A&B, Covid) Anterior Nasal Swab     Status: None   Collection Time: 04/15/22  7:25 PM   Specimen: Anterior Nasal Swab  Result Value Ref Range Status   SARS Coronavirus 2 by RT PCR NEGATIVE NEGATIVE Final    Comment: (NOTE) SARS-CoV-2 target nucleic acids are NOT DETECTED.  The SARS-CoV-2 RNA is generally detectable in upper respiratory specimens during the acute phase of infection. The lowest concentration of SARS-CoV-2 viral copies this assay can detect is 138 copies/mL. A negative result does not preclude SARS-Cov-2 infection and should not be used as the sole basis for treatment or other patient management decisions. A negative result may occur with  improper specimen collection/handling, submission of specimen other than nasopharyngeal swab, presence of viral mutation(s) within the areas targeted by this assay, and inadequate number of viral copies(<138 copies/mL). A negative result must be combined with clinical observations, patient history, and epidemiological information. The expected result is Negative.  Fact Sheet for Patients:  EntrepreneurPulse.com.au  Fact Sheet for Healthcare Providers:  IncredibleEmployment.be  This test is no t yet approved or cleared by the [place] FDA and  has been authorized for detection and/or diagnosis of SARS-CoV-2 by FDA under an Emergency Use Authorization (EUA). This EUA will remain  in effect (meaning this test can be used) for the duration of the COVID-19 declaration under Section 564(b)(1) of the Act, 21 U.S.C.section 360bbb-3(b)(1), unless the authorization is terminated  or revoked sooner.       Influenza A by PCR NEGATIVE NEGATIVE Final   Influenza B by PCR NEGATIVE NEGATIVE Final    Comment:  (NOTE) The Xpert Xpress SARS-CoV-2/FLU/RSV plus assay is intended as an aid in the diagnosis of influenza from Nasopharyngeal swab specimens and should not be used as a sole basis for treatment. Nasal washings and aspirates are unacceptable for Xpert Xpress SARS-CoV-2/FLU/RSV testing.  Fact Sheet for Patients: EntrepreneurPulse.com.au  Fact Sheet for Healthcare Providers: IncredibleEmployment.be  This test is not yet approved or cleared by the [place] FDA and has been authorized for detection and/or diagnosis of SARS-CoV-2 by FDA under an Emergency Use Authorization (EUA). This EUA will remain in effect (meaning this test can be used) for the duration of the COVID-19 declaration under Section 564(b)(1) of the Act, 21 U.S.C. section 360bbb-3(b)(1), unless the authorization is terminated or revoked.     Resp Syncytial Virus by PCR NEGATIVE NEGATIVE Final    Comment: (NOTE) Fact Sheet for Patients: EntrepreneurPulse.com.au  Fact Sheet for Healthcare Providers: IncredibleEmployment.be  This test is not yet approved or cleared by the [place] FDA and has been authorized for detection and/or diagnosis of SARS-CoV-2 by FDA under an  Emergency Use Authorization (EUA). This EUA will remain in effect (meaning this test can be used) for the duration of the COVID-19 declaration under Section 564(b)(1) of the Act, 21 U.S.C. section 360bbb-3(b)(1), unless the authorization is terminated or revoked.  Performed at [facility], [address] [place], [place] [address]     Coagulation Studies: Recent Labs    04/15/22 1654  LABPROT 15.1  INR 1.2    Urinalysis: No results for input(s): "COLORURINE", "LABSPEC", "PHURINE", "GLUCOSEU", "HGBUR", "BILIRUBINUR", "KETONESUR", "PROTEINUR", "UROBILINOGEN", "NITRITE", "LEUKOCYTESUR" in the last 72 hours.  Invalid input(s): "APPERANCEUR"     Imaging: DG Foot 2 Views Right  Result Date: 04/15/2022 CLINICAL DATA:  Right foot wounds EXAM: RIGHT FOOT - 2 VIEW COMPARISON:  04/21/2021, MRI 04/24/2021 FINDINGS: Previous fifth transmetatarsal amputation, no acute findings at this location. Interval progression of bony destructive change involving the distal fourth metatarsal and base of the fourth proximal phalanx. Osseous destructive change involving the head of third metatarsal and base of third proximal phalanx. Similar findings involving head of second metatarsal and base of second proximal phalanx with probable pathologic fracture at the head of second metatarsal. Widening of the first MTP joint with erosive changes at the base of first proximal phalanx and head of first metatarsal also concerning for septic arthritis and osteomyelitis. Small erosion head of the first proximal phalanx. Vascular calcifications. Multiple ulcerations along the plantar surface of the foot. No frank soft tissue gas. IMPRESSION: 1. Previous fifth transmetatarsal amputation. 2. Interval progression of bony destructive change involving the distal second third and fourth metatarsals as well as the second third and fourth proximal phalanges consistent with osteomyelitis. 3. Widening of the first MTP joint with erosive changes at the base of first proximal phalanx and head of first metatarsal consistent with septic joint and osteomyelitis. New erosive changes at the head of the first proximal phalanx also consistent with osteomyelitis. Electronically Signed   By: Donavan Foil M.D.   On: 04/15/2022 20:45   DG Chest 2 View  Result Date: 04/15/2022 CLINICAL DATA:  Sepsis EXAM: CHEST - 2 VIEW COMPARISON:  X-ray 12/02/2021 and older FINDINGS: Calcifications of the tracheobronchial tree. Underinflation with some basilar atelectasis or scar. Enlarged cardiopericardial silhouette with increasing interstitial changes. Peribronchial thickening. No pneumothorax. Vascular  calcifications. Films are under penetrated IMPRESSION: Enlarged cardiopericardial silhouette with vascular congestion and interstitial changes. Acute process is possible. Please correlate with symptoms and recommend follow-up Electronically Signed   By: Jill Side M.D.   On: 04/15/2022 17:26     Medications:    cefTRIAXone (ROCEPHIN)  IV Stopped (04/16/22 0055)   vancomycin      aspirin EC  81 mg Oral Daily   atorvastatin  40 mg Oral Daily   budesonide  0.5 mg Nebulization BID   Chlorhexidine Gluconate Cloth  6 each Topical Q0600   heparin  5,000 Units Subcutaneous Q8H   insulin aspart  0-6 Units Subcutaneous Q4H   insulin glargine-yfgn  15 Units Subcutaneous Daily   ipratropium-albuterol  3 mL Nebulization Q6H   metroNIDAZOLE  500 mg Oral Q8H   sevelamer carbonate  2,400 mg Oral TID WC   acetaminophen **OR** acetaminophen, albuterol, albuterol, diphenhydrAMINE, ondansetron **OR** ondansetron (ZOFRAN) IV, oxyCODONE-acetaminophen  Assessment/ Plan:  [name] is a 52 y.o.  female with a past medical history of end-stage renal disease, patient is on Tuesday Thursday Saturday schedule, diabetes mellitus, history of pancreatitis who came to the ER with chief complaint of foot pain.  She has been admitted for Osteomyelitis of right foot ([place]) [M86.9] Acute hematogenous osteomyelitis of right foot ([place]) [M86.071]   [facility] TTS Left AVF 127.5kg   End stage renal disease on hemodialysis. Scheduled to receive treatment later today. UF goal 2L as tolerated. Next treatment scheduled for Thursday.   2. Anemia of chronic kidney disease Lab Results  Component Value Date   HGB 11.9 (L) 04/15/2022    Hgb within desired range. Will monitor for need of ESA.  3. Secondary Hyperparathyroidism: with outpatient labs: PTH 1243, phosphorus 11.9, calcium 8.9 on 07/03/21.   Lab Results  Component Value Date   PTH 134 (H) 04/21/2017   CALCIUM 8.7 (L) 04/15/2022   CAION 0.81 (LL)  10/26/2020   PHOS 8.0 (H) 05/01/2021    Calcium remains acceptable and phosphorus elevated. Continue sevelamer with meals.   4. Diabetes mellitus type II with chronic kidney disease/renal manifestations: noninsulin dependent. Most recent hemoglobin A1c is 7.3 on 04/16/22.   5. Osteomyelitis, right foot. Vascular planning to perform angiogram tomorrow. Continue antibiotics.    LOS: 1 [place] 2/20/20241:20 PM

## 2022-04-16 NOTE — Progress Notes (Signed)
Received patient in bed to unit.  Alert and oriented.  Informed consent signed and in chart.   TX duration: 3.19 hours  Patient tolerated well.  Transported back to the room  Alert, without acute distress.  Hand-off given to patient's nurse.   Access used: Left upper arm fistula Access issues: none  Total UF removed: 1.8L Medication(s) given: Midodrine, Tylenol, and Vancomycin    [facility]

## 2022-04-16 NOTE — Progress Notes (Addendum)
Bowlby, [name] ([id number]) [id number] Page 1 of 8 Visit Report for 04/15/2022 Chief Complaint Document Details Patient Name: Date of Service: [name], [name] RLA M. 04/15/2022 3:00 PM Medical Record Number: [id number] Patient Account Number: [id number] Date of Birth/Sex: Treating RN: [birth year] (52 y.o. Orvan Falconer Primary Care Provider: Evern Bio Other Clinician: Referring Provider: Treating Provider/Extender: Darien Ramus Weeks in Treatment: 13 Information Obtained from: Patient Chief Complaint Right foot gangrene and osteomyelitis Electronic Signature(s) Signed: 04/15/2022 3:20:45 PM By: Worthy Keeler PA-C Entered By: Worthy Keeler on 04/15/2022 15:20:45 -------------------------------------------------------------------------------- HPI Details Patient Name: Date of Service: [name], [name] RLA M. 04/15/2022 3:00 PM Medical Record Number: [id number] Patient Account Number: [id number] Date of Birth/Sex: Treating RN: 12-Jun-1970 (52 y.o. Orvan Falconer Primary Care Provider: Evern Bio Other Clinician: Referring Provider: Treating Provider/Extender: Darien Ramus Weeks in Treatment: 46 History of Present Illness HPI Description: 10/04/2020 upon evaluation today patient presents for initial inspection here in the clinic concerning a fairly concerning wound over her right fifth toe towards the medial aspect which actually probes all the way down to bone. There is evidence of necrotic muscle as well and I do see tendon exposed as well. Subsequently this does have me very concerned about where things stand here. Again this is the first time that I am seeing her and on top of everything else her ABIs are noncompressible. I think that arterial flow is the primary concern here. Of note the patient states this was first noted July 1. She had a x-ray on July 4 which was negative for bone infection. She has been on 2 antibiotics  she is unsure of the name. With that being said her most recent hemoglobin A1c was 8.1 that was December 2022. She otherwise does have a history obviously of diabetes mellitus type 2, hypertension, end-stage renal disease with dependence on renal dialysis, and she is also dependent on supplemental oxygen. Readmission: 05/14/2021 this is a patient whom I previously seen October 04, 2020. With that being said she is subsequently on 21 October 2020 ended up having an amputation of her fifth digit of her foot unfortunately. This had progressed fairly quickly into what sounds to been gangrene at that time. Subsequently right now based on what I am seeing the patient actually has a necrotic area on the end of her foot and has been recommended that the only option really here is going to be a amputation which would be a below-knee amputation. She is really not interested in that and wants to try to give this every chance it can to heal. For that reason I discussed with her that definitely we can see what we can do to try to improve this situation and try to help get this doing better as best we can. With that being said there is definitely a portion of her wound that is just not good to be able to heal no matter what it is already starting to auto amputate. This includes potentially digits of her foot as well based on what I am seeing. She does have confirmed osteomyelitis noted by MRI. She is also currently on IV vancomycin. She does undergo dialysis on Tuesday, Thursday, and Saturday. The patient does have a history of diabetes mellitus type 2, congestive heart failure, hypertension, end-stage renal disease, dependence on renal dialysis, and dependence on supplemental oxygen. With that being said I am going to need to look into her heart failure and the significance of this as  far as hyperbarics is concerned before we initiate treatment. She is probably also going to need an updated EKG and chest x-ray unless  its already been done recently. 05/28/2021 upon evaluation today patient appears to be doing well with regard to her wound all things considered. I am going to try to see what we can do about trimmed away some of the necrotic tissue that is really just hanging on here and there and not really helpful. Feel like if we can slowly start doing this maybe we will be able to get to the point where this is better and better as far as cleaning up the surface of the wound is concerned. Still as I explained I do believe some of her toes are not salvageable but again she is looking more towards autoamputation versus want to do anything definitive here from a surgical standpoint. I have been researching whether she would be an okay candidate for hyperbarics or not and I found that her x-ray but she had a chest x-ray recently was negative for any acute disease and appear to be stable. EKG was also stable. She did also have a cardiac echo which showed that her ejection fraction was 60 to 65% and therefore I think she is a candidate for hyperbarics. Again I do believe that this can help with preventing amputation which right now she has been told that her only option would be a below-knee amputation. Nonetheless I think that if we can get the heel leading to spread and the majority of this cleared away that it is possible we might even be able to get a surgeon just to surgically remove the nonviable toes and not have to go the full way of complete amputation. Nonetheless we do have a ways to go before we will get to that point. Either way I think the hyperbarics could be an excellent support as far as that is concerned. 06-05-2021 upon evaluation today patient's wound is showing some signs of improvement. This does seem to be slow and of course it is understandable considering what we are seeing. She has discussed and looked up information about hyperbarics and in the end comes back today and states that she does want  to proceed. I honestly do feel like that is her best chance at saving the foot. Even if we are able to get her into a surgeon after we obtain some good healing approximately so that there is not as much necrotic tissue in general she may even be able to have a transmetatarsal amputation and get this to heal. Again I am not saying this is can be an easy road but it may be her best road as far as thinking about how to achieve some type of complete healing as I do not believe the toes are viable at all to be honest. Arrants, [name] ([id number]) [id number] Page 2 of 8 07-06-2021 upon evaluation today patient appears to be doing well currently in regard to her foot all things considered. Again she unfortunately was unable to do the hyperbaric oxygen therapy this was due to her claustrophobia. Nonetheless she tells me that she is pleased with how the foot appears and she does feel like there is good improvement going on here. In general I think that we are on the right track. If she is going to take a very long time and to be honest the route of autoamputation which is pretty much the way this is going is not a quick or  easy road but nonetheless she seems to be taking care of her foot quite well. She is in good spirits. 07-27-2021 upon evaluation today patient appears to be doing well currently in regard to her foot all things considered. A lot of the eschar is starting to lift up around the edges which is good there is still significant necrotic tissue on the distal portion of the foot. This is slowly going to work its way off. Based on what I am seeing currently I do not believe that there is any evidence of infection which is good news. 08-31-2021 upon evaluation today patient's wound actually appears to be doing decently well. I do not see anything in particular to try to trim away today. With that being said she continues to slowly allow this to do what is going to do as far as  autoamputation is concerned. I did have another conversation with her today about the possibility of seeing a surgeon for a transmetatarsal amputation which again would help to get this to close faster and obviously I think would be a much speedy year and safer way to proceed with amputation. With that being said she tells me that she would really prefer to just let this go at "God pace". 09-28-2021 upon evaluation today patient's foot actually appears to be doing much better. Fortunately I do not see any signs of infection which is great news and overall I am extremely pleased with where we stand today. There does not appear to be any signs of active infection locally or systemically which is great news. No fevers, chills, nausea, vomiting, or diarrhea. 10-26-2021 upon evaluation today patient appears to be doing well currently in regard to her foot all things considered. Fortunately I do not see any evidence of active infection locally or systemically at this time which is great news. No fevers, chills, nausea, vomiting, or diarrhea. She does have some of the eschar that is lifting up even a little bit more compared to last time I saw her. 11-30-2021 upon evaluation today patient's wound is still continuing to have some areas that lift up although there is nothing really that I feel like I could trim away too much today to be honest. Fortunately I do not see any evidence of systemic infection though locally she has been concerned about some drainage I do not see anything that seems to be actively red or erythematous or warm to touch which is good news. No fevers, chills, nausea, vomiting, or diarrhea. 12-21-2021 upon evaluation patient's foot ulcer actually is showing signs of some improvement with healthier tissue noted at areas with eschar that keeps clearing off. There is a bit of the eschar that is loosening up today as well and I think we will get a be able to get some of this cleared away that we  have been working on for a while. Fortunately I do not see any evidence of infection locally or systemically at this time which is great news. No fevers, chills, nausea, vomiting, or diarrhea. 01-11-2022 upon evaluation today patient appears to be doing decently well in regard to her foot she still has a necrotic area on the tip of her foot but she is tolerating the dressing changes without complication. Fortunately there does not appear to be any signs of infection locally or systemically at this time which is great news. No fevers, chills, nausea, vomiting, or diarrhea. 02-01-2022 upon evaluation today patient continues to show signs of improvement in regard to the area  where we have been able to remove the eschar. Unfortunately the tip of the foot I think is the part that is completely dead and not going to be coming back as far as any healing is concerned. I have discussed this with the patient multiple times however she does not want to have any type of surgical intervention. Obviously in that regard we will try to do what we can as best we can. 02-15-2022 upon evaluation today patient appears to be doing well with regard to her foot. She is actually showing some signs of improvement she has had some drainage but nothing appears to be doing too poorly overall. I do not see any signs of infection and I am pleased in that regard. Fortunately I do not see any evidence of infection locally nor systemically which is great news. 03-01-2022 upon evaluation today patient appears to be doing well currently in regard to her wounds. She has been tolerating the dressing changes without complication. Fortunately I do not see any signs of active infection locally nor systemically which is great news. No fevers, chills, nausea, vomiting, or diarrhea. 03-15-2022 upon evaluation today patient's wound continues to show signs of loosening the splint some of the eschar is concerned on the tip of her foot. Fortunately  I do not see any evidence of infection systemically which is good news locally I feel like she is making good progress here. Overall I am extremely happy with where things stand. Especially considering the fact that really she does need to have an amputation of the necrotic portion of the end of her foot. Again if we got rid of those toes I do think that this could actually heal much more effectively and quickly with that being said she is completely against going down that road so we will try to do what we can as conservatively as we can while still making some progress here. 04-01-2022 upon evaluation today patient's foot unfortunately does not appear to be doing as well as I would like to see. I discussed with her today that I do believe she probably needs to be very mindful of this and how things are progressing. I explained as well that this is what I been most worried about the whole time that I been seeing her since really at least March of last year when I started seeing her again. Subsequently she notes that she has had more drainage also feel that the drainage is more discolored which is not good. 04-08-2022 upon evaluation today patient appears to be doing well currently in regard to her foot ulcer this is much better than last week's evaluation. Fortunately I do not see any signs of active infection systemically though locally we still see some drainage I think this is much better with the doxycycline she has been taking that since we got the results back of the culture nothing has really changed in that regard. Fortunately I think that we are on the right track. 04-15-2022 upon evaluation today patient unfortunately appears to be doing a bit worse compared to her normal. She tells me that she has been sick with what she presumed might have been a GI bug over the weekend but to be honest I am not so certain that she could not be showing early signs of sepsis. Her foot is starting to become a  little bit more purulent as far as the drainage is concerned of hide on doxycycline for a couple weeks already which seemed to help  initially to a degree but unfortunately I am just not sure this is maintaining. It started to get a little bit more weight around the edges and is starting to especially around the lateral toe show signs of progression at this point. I am coupled with the fact that her blood pressure is low today compared to her normal concerned that we should probably have her go to the ER for further evaluation and treatment in order to evaluate for the possibility of early sepsis. This is something we have had a close eye on for quite some time and it is something that I think needs to be fully evaluated at this point. Electronic Signature(s) Signed: 04/15/2022 3:47:07 PM By: Worthy Keeler PA-C Entered By: Worthy Keeler on 04/15/2022 15:47:07 -------------------------------------------------------------------------------- Physical Exam Details Patient Name: Date of Service: [name], [name] RLA M. 04/15/2022 3:00 PM Medical Record Number: [id number] Patient Account Number: [id number] Date of Birth/Sex: Treating RN: 01/11/1971 (53 y.o. Orvan Falconer Primary Care Provider: Evern Bio Other ClinicianVALMA, METZGER ([id number]) [id number] Page 3 of 8 Referring Provider: Treating Provider/Extender: Darien Ramus Weeks in Treatment: 33 Constitutional Obese and well-hydrated in no acute distress. Respiratory normal breathing without difficulty. Psychiatric this patient is able to make decisions and demonstrates good insight into disease process. Alert and Oriented x 3. pleasant and cooperative. Notes Upon inspection patient's wound bed actually showed signs of again still some necrotic tissue on the distal portion of the foot where she obviously has necrotic and dead toes. She also does have an area which has been progressively  migrating upward of the foot where we have been removing some of the dead tissue as we go in the summer this has been still continuing to breakdown little by little. Nonetheless she does have some good granulation tissue as well this occur as a result of some of this. Nonetheless coupled with the fact that she has been having issues with diarrhea, she now has low blood pressure, and overall the appearance of her foot not being quite as good as what it has been I am concerned about the possibility of sepsis and again. Electronic Signature(s) Signed: 04/15/2022 3:47:53 PM By: Worthy Keeler PA-C Entered By: Worthy Keeler on 04/15/2022 15:47:52 -------------------------------------------------------------------------------- Physician Orders Details Patient Name: Date of Service: Sibert, [name] RLA M. 04/15/2022 3:00 PM Medical Record Number: [id number] Patient Account Number: [id number] Date of Birth/Sex: Treating RN: 1970-08-24 (52 y.o. Orvan Falconer Primary Care Provider: Evern Bio Other Clinician: Referring Provider: Treating Provider/Extender: Darien Ramus Weeks in Treatment: 64 Verbal / Phone Orders: No Diagnosis Coding ICD-10 Coding Code Description M86.371 Chronic multifocal osteomyelitis, right ankle and foot E11.621 Type 2 diabetes mellitus with foot ulcer L97.514 Non-pressure chronic ulcer of other part of right foot with necrosis of bone I50.42 Chronic combined systolic (congestive) and diastolic (congestive) heart failure I10 Essential (primary) hypertension N18.6 End stage renal disease Z99.2 Dependence on renal dialysis Z99.81 Dependence on supplemental oxygen Follow-up Appointments Return Appointment in 3 weeks. Nurse Visit as needed [facility] for wound care. May utilize formulary equivalent dressing for wound treatment orders unless otherwise specified. Home Health Nurse may visit PRN to address  patients wound care needs. Scheduled days for dressing changes to be completed; exception, patient has scheduled wound care visit that day. **Please direct any NON-WOUND related issues/requests for orders to patient's Primary Care Physician. **If current dressing causes regression in wound condition, may  D/C ordered dressing product/s and apply Normal Saline Moist Dressing daily until next [facility] or Other MD appointment. **Notify [facility] of regression in wound condition at [phone]. Bathing/ Shower/ Hygiene Clean wound with Normal Saline or wound cleanser. Wash wounds with antibacterial soap and water. May shower; gently cleanse wound with antibacterial soap, rinse and pat dry prior to dressing wounds No tub bath. Off-Loading Open toe surgical shoe Medications-Please add to medication list. ntibiotics - continue Doxycycline P.O. A ntibiotic - gentamycin to open area of foot Topical A Larsen, [name] ([id number]) [id number] Page 4 of 8 Wound Treatment Wound #2 - Foot Wound Laterality: Right, Circumferential Cleanser: Normal Saline 1 x Per Day/30 Days Discharge Instructions: Wash your hands with soap and water. Remove old dressing, discard into plastic bag and place into trash. Cleanse the wound with Normal Saline prior to applying a clean dressing using gauze sponges, not tissues or cotton balls. Do not scrub or use excessive force. Pat dry using gauze sponges, not tissue or cotton balls. Cleanser: Soap and Water 1 x Per Day/30 Days Discharge Instructions: Gently cleanse wound with antibacterial soap, rinse and pat dry prior to dressing wounds Topical: Gentamicin 1 x Per Day/30 Days Discharge Instructions: Apply to open area of foot Prim Dressing: AquacelAg Advantage Dressing, 4x5 (in/in) (Dispense As Written) 1 x Per Day/30 Days ary Discharge Instructions: Apply to wound as directed Prim Dressing: Gauze 1 x Per Day/30  Days ary Discharge Instructions: soak with Betadine and wrap around toes Secondary Dressing: ABD Pad 5x9 (in/in) 1 x Per Day/30 Days Discharge Instructions: Cover with ABD pad Secured With: Medipore T - 60M Medipore H Soft Cloth Surgical T ape ape, 2x2 (in/yd) 1 x Per Day/30 Days Secured With: Kerlix Roll Sterile or Non-Sterile 6-ply 4.5x4 (yd/yd) 1 x Per Day/30 Days Discharge Instructions: Apply Kerlix as directed Secured With: Stretch Net Dressing, Latex-free, Size 5, Small-Head / Shoulder / Thigh 1 x Per Day/30 Days Electronic Signature(s) Signed: 04/15/2022 4:31:31 PM By: Worthy Keeler PA-C Signed: 05/21/2022 8:43:41 AM By: Carlene Coria RN Entered By: Carlene Coria on 04/15/2022 15:44:27 -------------------------------------------------------------------------------- Problem List Details Patient Name: Date of Service: [name], [name] RLA M. 04/15/2022 3:00 PM Medical Record Number: [id number] Patient Account Number: [id number] Date of Birth/Sex: Treating RN: 1970-04-16 (52 y.o. Orvan Falconer Primary Care Provider: Evern Bio Other Clinician: Referring Provider: Treating Provider/Extender: Darien Ramus Weeks in Treatment: 67 Active Problems ICD-10 Encounter Code Description Active Date MDM Diagnosis M86.371 Chronic multifocal osteomyelitis, right ankle and foot 05/18/2021 No Yes E11.621 Type 2 diabetes mellitus with foot ulcer 05/18/2021 No Yes L97.514 Non-pressure chronic ulcer of other part of right foot with necrosis of bone 05/18/2021 No Yes I50.42 Chronic combined systolic (congestive) and diastolic (congestive) heart failure 05/18/2021 No Yes I10 Essential (primary) hypertension 05/18/2021 No Yes N18.6 End stage renal disease 05/18/2021 No Yes Lysaght, [name] ([id number]) [id number] Page 5 of 8 Z99.2 Dependence on renal dialysis 05/18/2021 No Yes Z99.81 Dependence on supplemental oxygen 05/18/2021 No Yes Inactive Problems Resolved  Problems Electronic Signature(s) Signed: 04/15/2022 3:20:40 PM By: Worthy Keeler PA-C Entered By: Worthy Keeler on 04/15/2022 15:20:40 -------------------------------------------------------------------------------- Progress Note Details Patient Name: Date of Service: [name], [name] RLA M. 04/15/2022 3:00 PM Medical Record Number: [id number] Patient Account Number: [id number] Date of Birth/Sex: Treating RN: Apr 23, 1970 (52 y.o. Orvan Falconer Primary Care Provider: Evern Bio Other Clinician: Referring Provider: Treating Provider/Extender: Darien Ramus Weeks in Treatment: 79 Subjective Chief Complaint  Information obtained from Patient Right foot gangrene and osteomyelitis History of Present Illness (HPI) 10/04/2020 upon evaluation today patient presents for initial inspection here in the clinic concerning a fairly concerning wound over her right fifth toe towards the medial aspect which actually probes all the way down to bone. There is evidence of necrotic muscle as well and I do see tendon exposed as well. Subsequently this does have me very concerned about where things stand here. Again this is the first time that I am seeing her and on top of everything else her ABIs are noncompressible. I think that arterial flow is the primary concern here. Of note the patient states this was first noted July 1. She had a x-ray on July 4 which was negative for bone infection. She has been on 2 antibiotics she is unsure of the name. With that being said her most recent hemoglobin A1c was 8.1 that was December 2022. She otherwise does have a history obviously of diabetes mellitus type 2, hypertension, end-stage renal disease with dependence on renal dialysis, and she is also dependent on supplemental oxygen. Readmission: 05/14/2021 this is a patient whom I previously seen October 04, 2020. With that being said she is subsequently on 21 October 2020 ended up having an amputation of her  fifth digit of her foot unfortunately. This had progressed fairly quickly into what sounds to been gangrene at that time. Subsequently right now based on what I am seeing the patient actually has a necrotic area on the end of her foot and has been recommended that the only option really here is going to be a amputation which would be a below-knee amputation. She is really not interested in that and wants to try to give this every chance it can to heal. For that reason I discussed with her that definitely we can see what we can do to try to improve this situation and try to help get this doing better as best we can. With that being said there is definitely a portion of her wound that is just not good to be able to heal no matter what it is already starting to auto amputate. This includes potentially digits of her foot as well based on what I am seeing. She does have confirmed osteomyelitis noted by MRI. She is also currently on IV vancomycin. She does undergo dialysis on Tuesday, Thursday, and Saturday. The patient does have a history of diabetes mellitus type 2, congestive heart failure, hypertension, end-stage renal disease, dependence on renal dialysis, and dependence on supplemental oxygen. With that being said I am going to need to look into her heart failure and the significance of this as far as hyperbarics is concerned before we initiate treatment. She is probably also going to need an updated EKG and chest x-ray unless its already been done recently. 05/28/2021 upon evaluation today patient appears to be doing well with regard to her wound all things considered. I am going to try to see what we can do about trimmed away some of the necrotic tissue that is really just hanging on here and there and not really helpful. Feel like if we can slowly start doing this maybe we will be able to get to the point where this is better and better as far as cleaning up the surface of the wound is concerned. Still  as I explained I do believe some of her toes are not salvageable but again she is looking more towards autoamputation versus want to do  anything definitive here from a surgical standpoint. I have been researching whether she would be an okay candidate for hyperbarics or not and I found that her x-ray but she had a chest x-ray recently was negative for any acute disease and appear to be stable. EKG was also stable. She did also have a cardiac echo which showed that her ejection fraction was 60 to 65% and therefore I think she is a candidate for hyperbarics. Again I do believe that this can help with preventing amputation which right now she has been told that her only option would be a below-knee amputation. Nonetheless I think that if we can get the heel leading to spread and the majority of this cleared away that it is possible we might even be able to get a surgeon just to surgically remove the nonviable toes and not have to go the full way of complete amputation. Nonetheless we do have a ways to go before we will get to that point. Either way I think the hyperbarics could be an excellent support as far as that is concerned. 06-05-2021 upon evaluation today patient's wound is showing some signs of improvement. This does seem to be slow and of course it is understandable considering what we are seeing. She has discussed and looked up information about hyperbarics and in the end comes back today and states that she does want to proceed. I honestly do feel like that is her best chance at saving the foot. Even if we are able to get her into a surgeon after we obtain some good healing approximately so that there is not as much necrotic tissue in general she may even be able to have a transmetatarsal amputation and get this to heal. Again I am not saying this is can be an easy road but it may be her best road as far as thinking about how to achieve some type of complete healing as I do not believe the toes  are viable at all to be honest. 07-06-2021 upon evaluation today patient appears to be doing well currently in regard to her foot all things considered. Again she unfortunately was unable to do the hyperbaric oxygen therapy this was due to her claustrophobia. Nonetheless she tells me that she is pleased with how the foot appears and she does feel Kowal, [name] ([id number]) [id number] Page 6 of 8 like there is good improvement going on here. In general I think that we are on the right track. If she is going to take a very long time and to be honest the route of autoamputation which is pretty much the way this is going is not a quick or easy road but nonetheless she seems to be taking care of her foot quite well. She is in good spirits. 07-27-2021 upon evaluation today patient appears to be doing well currently in regard to her foot all things considered. A lot of the eschar is starting to lift up around the edges which is good there is still significant necrotic tissue on the distal portion of the foot. This is slowly going to work its way off. Based on what I am seeing currently I do not believe that there is any evidence of infection which is good news. 08-31-2021 upon evaluation today patient's wound actually appears to be doing decently well. I do not see anything in particular to try to trim away today. With that being said she continues to slowly allow this to do what is going to do  as far as autoamputation is concerned. I did have another conversation with her today about the possibility of seeing a surgeon for a transmetatarsal amputation which again would help to get this to close faster and obviously I think would be a much speedy year and safer way to proceed with amputation. With that being said she tells me that she would really prefer to just let this go at "God pace". 09-28-2021 upon evaluation today patient's foot actually appears to be doing much better.  Fortunately I do not see any signs of infection which is great news and overall I am extremely pleased with where we stand today. There does not appear to be any signs of active infection locally or systemically which is great news. No fevers, chills, nausea, vomiting, or diarrhea. 10-26-2021 upon evaluation today patient appears to be doing well currently in regard to her foot all things considered. Fortunately I do not see any evidence of active infection locally or systemically at this time which is great news. No fevers, chills, nausea, vomiting, or diarrhea. She does have some of the eschar that is lifting up even a little bit more compared to last time I saw her. 11-30-2021 upon evaluation today patient's wound is still continuing to have some areas that lift up although there is nothing really that I feel like I could trim away too much today to be honest. Fortunately I do not see any evidence of systemic infection though locally she has been concerned about some drainage I do not see anything that seems to be actively red or erythematous or warm to touch which is good news. No fevers, chills, nausea, vomiting, or diarrhea. 12-21-2021 upon evaluation patient's foot ulcer actually is showing signs of some improvement with healthier tissue noted at areas with eschar that keeps clearing off. There is a bit of the eschar that is loosening up today as well and I think we will get a be able to get some of this cleared away that we have been working on for a while. Fortunately I do not see any evidence of infection locally or systemically at this time which is great news. No fevers, chills, nausea, vomiting, or diarrhea. 01-11-2022 upon evaluation today patient appears to be doing decently well in regard to her foot she still has a necrotic area on the tip of her foot but she is tolerating the dressing changes without complication. Fortunately there does not appear to be any signs of infection locally or  systemically at this time which is great news. No fevers, chills, nausea, vomiting, or diarrhea. 02-01-2022 upon evaluation today patient continues to show signs of improvement in regard to the area where we have been able to remove the eschar. Unfortunately the tip of the foot I think is the part that is completely dead and not going to be coming back as far as any healing is concerned. I have discussed this with the patient multiple times however she does not want to have any type of surgical intervention. Obviously in that regard we will try to do what we can as best we can. 02-15-2022 upon evaluation today patient appears to be doing well with regard to her foot. She is actually showing some signs of improvement she has had some drainage but nothing appears to be doing too poorly overall. I do not see any signs of infection and I am pleased in that regard. Fortunately I do not see any evidence of infection locally nor systemically which is great  news. 03-01-2022 upon evaluation today patient appears to be doing well currently in regard to her wounds. She has been tolerating the dressing changes without complication. Fortunately I do not see any signs of active infection locally nor systemically which is great news. No fevers, chills, nausea, vomiting, or diarrhea. 03-15-2022 upon evaluation today patient's wound continues to show signs of loosening the splint some of the eschar is concerned on the tip of her foot. Fortunately I do not see any evidence of infection systemically which is good news locally I feel like she is making good progress here. Overall I am extremely happy with where things stand. Especially considering the fact that really she does need to have an amputation of the necrotic portion of the end of her foot. Again if we got rid of those toes I do think that this could actually heal much more effectively and quickly with that being said she is completely against going down that road  so we will try to do what we can as conservatively as we can while still making some progress here. 04-01-2022 upon evaluation today patient's foot unfortunately does not appear to be doing as well as I would like to see. I discussed with her today that I do believe she probably needs to be very mindful of this and how things are progressing. I explained as well that this is what I been most worried about the whole time that I been seeing her since really at least March of last year when I started seeing her again. Subsequently she notes that she has had more drainage also feel that the drainage is more discolored which is not good. 04-08-2022 upon evaluation today patient appears to be doing well currently in regard to her foot ulcer this is much better than last week's evaluation. Fortunately I do not see any signs of active infection systemically though locally we still see some drainage I think this is much better with the doxycycline she has been taking that since we got the results back of the culture nothing has really changed in that regard. Fortunately I think that we are on the right track. 04-15-2022 upon evaluation today patient unfortunately appears to be doing a bit worse compared to her normal. She tells me that she has been sick with what she presumed might have been a GI bug over the weekend but to be honest I am not so certain that she could not be showing early signs of sepsis. Her foot is starting to become a little bit more purulent as far as the drainage is concerned of hide on doxycycline for a couple weeks already which seemed to help initially to a degree but unfortunately I am just not sure this is maintaining. It started to get a little bit more weight around the edges and is starting to especially around the lateral toe show signs of progression at this point. I am coupled with the fact that her blood pressure is low today compared to her normal concerned that we should probably  have her go to the ER for further evaluation and treatment in order to evaluate for the possibility of early sepsis. This is something we have had a close eye on for quite some time and it is something that I think needs to be fully evaluated at this point. Objective Constitutional Obese and well-hydrated in no acute distress. Vitals Time Taken: 3:05 PM, Temperature: 98.2 F, Pulse: 106 bpm, Respiratory Rate: 16 breaths/min, Blood Pressure: 82/42 mmHg.  Respiratory normal breathing without difficulty. Tata, [name] ([id number]) [id number] Page 7 of 8 Psychiatric this patient is able to make decisions and demonstrates good insight into disease process. Alert and Oriented x 3. pleasant and cooperative. General Notes: Upon inspection patient's wound bed actually showed signs of again still some necrotic tissue on the distal portion of the foot where she obviously has necrotic and dead toes. She also does have an area which has been progressively migrating upward of the foot where we have been removing some of the dead tissue as we go in the summer this has been still continuing to breakdown little by little. Nonetheless she does have some good granulation tissue as well this occur as a result of some of this. Nonetheless coupled with the fact that she has been having issues with diarrhea, she now has low blood pressure, and overall the appearance of her foot not being quite as good as what it has been I am concerned about the possibility of sepsis and again. Integumentary (Hair, Skin) Wound #2 status is Open. Original cause of wound was Gradually Appeared. The date acquired was: 10/15/2020. The wound has been in treatment 47 weeks. The wound is located on the Right,Circumferential Foot. The wound measures 12.5cm length x 8cm width x 0.2cm depth; 78.54cm^2 area and 15.708cm^3 volume. There is Fat Layer (Subcutaneous Tissue) exposed. There is no tunneling or undermining  noted. There is a medium amount of serosanguineous drainage noted. There is medium (34-66%) granulation within the wound bed. There is a medium (34-66%) amount of necrotic tissue within the wound bed including Eschar and Necrosis of Bone. 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 Nurse Visit as needed Home Health: [facility] for wound care. May utilize formulary equivalent dressing for wound treatment orders unless otherwise specified. Home Health Nurse may visit PRN to address patientoos wound care needs. Scheduled days for dressing changes to be completed; exception, patient has scheduled wound care visit that day. **Please direct any NON-WOUND related issues/requests for orders to patient's Primary Care Physician. **If current dressing causes regression in wound condition, may D/C ordered dressing product/s and apply Normal Saline Moist Dressing daily until next [facility] or Other MD appointment. **Notify [facility] of regression in wound condition at [phone]. Bathing/ Shower/ Hygiene: Clean wound with Normal Saline or wound cleanser. Wash wounds with antibacterial soap and water. May shower; gently cleanse wound with antibacterial soap, rinse and pat dry prior to dressing wounds No tub bath. Off-Loading: Open toe surgical shoe Medications-Please add to medication list.: P.O. Antibiotics - continue Doxycycline Topical Antibiotic - gentamycin to open area of foot WOUND #2: - Foot Wound  Laterality: Right, Circumferential Cleanser: Normal Saline 1 x Per Day/30 Days Discharge Instructions: Wash your hands with soap and water. Remove old dressing, discard into plastic bag and place into trash. Cleanse the wound with Normal Saline prior to applying a clean dressing using gauze sponges, not tissues or cotton balls. Do not scrub or use excessive force. Pat dry using gauze sponges, not tissue or cotton balls. Cleanser: Soap and Water 1 x Per Day/30 Days Discharge Instructions: Gently cleanse  wound with antibacterial soap, rinse and pat dry prior to dressing wounds Topical: Gentamicin 1 x Per Day/30 Days Discharge Instructions: Apply to open area of foot Prim Dressing: AquacelAg Advantage Dressing, 4x5 (in/in) (Dispense As Written) 1 x Per Day/30 Days ary Discharge Instructions: Apply to wound as directed Prim Dressing: Gauze 1 x Per Day/30 Days ary Discharge Instructions: soak with Betadine and wrap around toes Secondary Dressing: ABD Pad 5x9 (in/in) 1 x Per Day/30 Days Discharge Instructions: Cover with ABD pad Secured With: Medipore T - 45M Medipore H Soft Cloth Surgical T ape ape, 2x2 (in/yd) 1 x Per Day/30 Days Secured With: [employer] Sterile or Non-Sterile 6-ply 4.5x4 (yd/yd) 1 x Per Day/30 Days Discharge Instructions: Apply Kerlix as directed Secured With: [employer] Dressing, Latex-free, Size 5, Small-Head / Shoulder / Thigh 1 x Per Day/30 Days 1. I am good recommend currently that we have the patient go ahead and go to the ER for further evaluation for the possibility of sepsis. She voiced understanding and she states that she is getting gone over to the hospital duration and treatment. 2. Also can recommend that we have the patient continue currently with the dressings as before when she is at home although for now we will put an ABD pad on and roll gauze to get her over to the hospital. 3. I am also can recommend that she should continue to stay off of this which she  has been doing obviously but again my biggest concern right now is sepsis Heskett, [name] ([id number]) [id number] Page 8 of 8 which I think she needs to have fully evaluated. we will see patient back for reevaluation in 1 week here in the clinic. If anything worsens or changes patient will contact our office for additional recommendations. Electronic Signature(s) Signed: 04/15/2022 3:48:48 PM By: Worthy Keeler PA-C Entered By: Worthy Keeler on 04/15/2022 15:48:48 -------------------------------------------------------------------------------- SuperBill Details Patient Name: Date of Service: Jernigan, [name] RLA M. 04/15/2022 Medical Record Number: [id number] Patient Account Number: [id number] Date of Birth/Sex: Treating RN: 22-Mar-1970 (52 y.o. Orvan Falconer Primary Care Provider: Evern Bio Other Clinician: Referring Provider: Treating Provider/Extender: Darien Ramus Weeks in Treatment: 21 Diagnosis Coding ICD-10 Codes Code Description M86.371 Chronic multifocal osteomyelitis, right ankle and foot E11.621 Type 2 diabetes mellitus with foot ulcer L97.514 Non-pressure chronic ulcer of other part of right foot with necrosis of bone I50.42 Chronic combined systolic (congestive) and diastolic (congestive) heart failure I10 Essential (primary) hypertension N18.6 End stage renal disease Z99.2 Dependence on renal dialysis Z99.81 Dependence on supplemental oxygen Facility Procedures : 7 CPT4 Code: [id number] Description: [id number] - WOUND CARE VISIT-LEV 2 EST PT Modifier: Quantity: 1 Physician Procedures : CPT4 Code Description Modifier [id number] - WC PHYS LEVEL 5 - EST PT ICD-10 Diagnosis Description M86.371 Chronic multifocal osteomyelitis, right ankle and foot E11.621 Type 2 diabetes mellitus with foot ulcer L97.514 Non-pressure chronic ulcer of  other part of right foot with necrosis of bone I50.42 Chronic combined systolic (congestive) and  diastolic (congestive) heart failure Quantity: 1 Electronic Signature(s) Signed: 04/15/2022 3:49:00 PM By: Worthy Keeler PA-C Entered By: Worthy Keeler on 04/15/2022 15:49:00

## 2022-04-16 NOTE — Progress Notes (Signed)
OT Cancellation Note  Patient Details Name: [name] MRN: [id number] DOB: 12-Apr-1970   Cancelled Treatment:    Reason Eval/Treat Not Completed: Patient not medically ready. OT orders received, chart reviewed. Planned angiogram procedure for morning of 2/21 with possible need for further intervention to RLE. Per vascular surgery, ok to hold therapy until completion of procedure. Will re-attempt evaluation tomorrow afternoon.   Lanelle Bal  [facility] 04/16/2022, 9:00 AM

## 2022-04-17 ENCOUNTER — Ambulatory Visit: Payer: 59 | Admitting: Cardiovascular Disease

## 2022-04-17 ENCOUNTER — Encounter
Admission: EM | Disposition: A | Discharge status: Home Health | Payer: Self-pay | Source: Home / Self Care | Attending: Internal Medicine

## 2022-04-17 DIAGNOSIS — M86171 Other acute osteomyelitis, right ankle and foot: Secondary | ICD-10-CM | POA: Diagnosis not present

## 2022-04-17 DIAGNOSIS — E11621 Type 2 diabetes mellitus with foot ulcer: Secondary | ICD-10-CM

## 2022-04-17 DIAGNOSIS — N189 Chronic kidney disease, unspecified: Secondary | ICD-10-CM

## 2022-04-17 DIAGNOSIS — L089 Local infection of the skin and subcutaneous tissue, unspecified: Secondary | ICD-10-CM

## 2022-04-17 DIAGNOSIS — I70235 Atherosclerosis of native arteries of right leg with ulceration of other part of foot: Secondary | ICD-10-CM

## 2022-04-17 DIAGNOSIS — L97519 Non-pressure chronic ulcer of other part of right foot with unspecified severity: Secondary | ICD-10-CM

## 2022-04-17 HISTORY — PX: LOWER EXTREMITY ANGIOGRAPHY: CATH118251

## 2022-04-17 LAB — GLUCOSE, CAPILLARY
Glucose-Capillary: 191 mg/dL — ABNORMAL HIGH (ref 70–99)
Glucose-Capillary: 156 mg/dL — ABNORMAL HIGH (ref 70–99)
Glucose-Capillary: 123 mg/dL — ABNORMAL HIGH (ref 70–99)
Glucose-Capillary: 139 mg/dL — ABNORMAL HIGH (ref 70–99)
Glucose-Capillary: 225 mg/dL — ABNORMAL HIGH (ref 70–99)

## 2022-04-17 LAB — CBC WITH DIFFERENTIAL/PLATELET
Abs Immature Granulocytes: 0.03 10*3/uL (ref 0.00–0.07)
Basophils Absolute: 0 10*3/uL (ref 0.0–0.1)
Basophils Relative: 0 %
Eosinophils Absolute: 0.1 10*3/uL (ref 0.0–0.5)
Eosinophils Relative: 2 %
HCT: 36.3 % (ref 36.0–46.0)
Hemoglobin: 11.4 g/dL — ABNORMAL LOW (ref 12.0–15.0)
Immature Granulocytes: 0 %
Lymphocytes Relative: 12 %
Lymphs Abs: 0.9 10*3/uL (ref 0.7–4.0)
MCH: 29.6 pg (ref 26.0–34.0)
MCHC: 31.4 g/dL (ref 30.0–36.0)
MCV: 94.3 fL (ref 80.0–100.0)
Monocytes Absolute: 0.6 10*3/uL (ref 0.1–1.0)
Monocytes Relative: 7 %
Neutro Abs: 5.9 10*3/uL (ref 1.7–7.7)
Neutrophils Relative %: 79 %
Platelets: 267 10*3/uL (ref 150–400)
RBC: 3.85 MIL/uL — ABNORMAL LOW (ref 3.87–5.11)
RDW: 13.9 % (ref 11.5–15.5)
WBC: 7.6 10*3/uL (ref 4.0–10.5)
nRBC: 0 % (ref 0.0–0.2)

## 2022-04-17 LAB — COMPREHENSIVE METABOLIC PANEL
ALT: 15 U/L (ref 0–44)
AST: 18 U/L (ref 15–41)
Albumin: 3.7 g/dL (ref 3.5–5.0)
Alkaline Phosphatase: 353 U/L — ABNORMAL HIGH (ref 38–126)
Anion gap: 15 (ref 5–15)
BUN: 48 mg/dL — ABNORMAL HIGH (ref 6–20)
CO2: 23 mmol/L (ref 22–32)
Calcium: 9 mg/dL (ref 8.9–10.3)
Chloride: 93 mmol/L — ABNORMAL LOW (ref 98–111)
Creatinine, Ser: 7.26 mg/dL — ABNORMAL HIGH (ref 0.44–1.00)
GFR, Estimated: 6 mL/min — ABNORMAL LOW (ref >60)
Glucose, Bld: 152 mg/dL — ABNORMAL HIGH (ref 70–99)
Potassium: 3.7 mmol/L (ref 3.5–5.1)
Sodium: 131 mmol/L — ABNORMAL LOW (ref 135–145)
Total Bilirubin: 0.7 mg/dL (ref 0.3–1.2)
Total Protein: 7.1 g/dL (ref 6.5–8.1)

## 2022-04-17 LAB — HEPATITIS B SURFACE ANTIBODY, QUANTITATIVE: Hep B S AB Quant (Post): <3.1 m[IU]/mL — ABNORMAL LOW (ref >9.9)

## 2022-04-17 SURGERY — LOWER EXTREMITY ANGIOGRAPHY
Anesthesia: Moderate Sedation | Laterality: Right

## 2022-04-17 MED ORDER — FENTANYL CITRATE (PF) 100 MCG/2ML IJ SOLN
INTRAMUSCULAR | Status: DC | PRN
  Administered 2022-04-17: 25 ug via INTRAVENOUS
  Administered 2022-04-17: 50 ug via INTRAVENOUS

## 2022-04-17 MED ORDER — CEFAZOLIN SODIUM-DEXTROSE 1-4 GM/50ML-% IV SOLN
INTRAVENOUS | Status: DC | PRN
  Administered 2022-04-17: 2 g via INTRAVENOUS

## 2022-04-17 MED ORDER — ONDANSETRON HCL 4 MG/2ML IJ SOLN
4.0000 mg | Freq: Four times a day (QID) | INTRAMUSCULAR | Status: DC | PRN
  Administered 2022-04-18: 4 mg via INTRAVENOUS
  Filled 2022-04-17: qty 2

## 2022-04-17 MED ORDER — MIDAZOLAM HCL 2 MG/2ML IJ SOLN
INTRAMUSCULAR | Status: DC | PRN
  Administered 2022-04-17: 2 mg via INTRAVENOUS
  Administered 2022-04-17: 1 mg via INTRAVENOUS

## 2022-04-17 MED ORDER — FENTANYL CITRATE PF 50 MCG/ML IJ SOSY: 12.5000 ug | PREFILLED_SYRINGE | Freq: Once | INTRAMUSCULAR | Status: DC | PRN

## 2022-04-17 MED ORDER — MIDAZOLAM HCL 5 MG/5ML IJ SOLN
INTRAMUSCULAR | Status: AC
  Filled 2022-04-17: qty 5

## 2022-04-17 MED ORDER — HYDROMORPHONE HCL 1 MG/ML IJ SOLN
1.0000 mg | Freq: Once | INTRAMUSCULAR | Status: AC | PRN
  Administered 2022-04-17: 1 mg via INTRAVENOUS
  Filled 2022-04-17: qty 1

## 2022-04-17 MED ORDER — ARFORMOTEROL TARTRATE 15 MCG/2ML IN NEBU
15.0000 ug | INHALATION_SOLUTION | Freq: Two times a day (BID) | RESPIRATORY_TRACT | Status: DC
  Administered 2022-04-17 – 2022-04-20 (×6): 15 ug via RESPIRATORY_TRACT
  Filled 2022-04-17 (×7): qty 2

## 2022-04-17 MED ORDER — MONTELUKAST SODIUM 10 MG PO TABS
10.0000 mg | ORAL_TABLET | Freq: Every day | ORAL | Status: DC
  Administered 2022-04-17 – 2022-04-19 (×3): 10 mg via ORAL
  Filled 2022-04-17 (×3): qty 1

## 2022-04-17 MED ORDER — FENTANYL CITRATE PF 50 MCG/ML IJ SOSY
12.5000 ug | PREFILLED_SYRINGE | INTRAMUSCULAR | Status: DC | PRN
  Administered 2022-04-17: 12.5 ug via INTRAVENOUS
  Filled 2022-04-17: qty 1

## 2022-04-17 MED ORDER — IODIXANOL 320 MG/ML IV SOLN
INTRAVENOUS | Status: DC | PRN
  Administered 2022-04-17: 30 mL via INTRA_ARTERIAL

## 2022-04-17 MED ORDER — FENTANYL CITRATE (PF) 100 MCG/2ML IJ SOLN
INTRAMUSCULAR | Status: AC
  Filled 2022-04-17: qty 2

## 2022-04-17 MED ORDER — IPRATROPIUM-ALBUTEROL 0.5-2.5 (3) MG/3ML IN SOLN
3.0000 mL | Freq: Three times a day (TID) | RESPIRATORY_TRACT | Status: DC
  Administered 2022-04-18 – 2022-04-19 (×4): 3 mL via RESPIRATORY_TRACT
  Filled 2022-04-17 (×4): qty 3

## 2022-04-17 MED ORDER — HEPARIN SODIUM (PORCINE) 1000 UNIT/ML IJ SOLN
INTRAMUSCULAR | Status: AC
  Filled 2022-04-17: qty 10

## 2022-04-17 MED ORDER — CEFAZOLIN SODIUM-DEXTROSE 2-4 GM/100ML-% IV SOLN
INTRAVENOUS | Status: AC
  Filled 2022-04-17: qty 100

## 2022-04-17 SURGICAL SUPPLY — 8 items
CATH ANGIO 5F PIGTAIL 65CM (CATHETERS) IMPLANT
COVER PROBE ULTRASOUND 5X96 (MISCELLANEOUS) IMPLANT
DEVICE STARCLOSE SE CLOSURE (Vascular Products) IMPLANT
PACK ANGIOGRAPHY (CUSTOM PROCEDURE TRAY) ×1 IMPLANT
PANNUS RETENTION SYSTEM 2 PAD (MISCELLANEOUS) IMPLANT
SYR MEDRAD MARK 7 150ML (SYRINGE) IMPLANT
TUBING CONTRAST HIGH PRESS 72 (TUBING) IMPLANT
WIRE GUIDERIGHT .035X150 (WIRE) IMPLANT

## 2022-04-17 NOTE — TOC Progression Note (Signed)
Transition of Care [facility]) - Progression Note    Patient Details  Name: [name] MRN: [id number] Date of Birth: 11-02-1970  [facility] CM/SW Contact  Laurena Slimmer, RN Phone Number: 04/17/2022, 9:25 AM  Clinical Narrative:    Case reviewed for needs and disposition.         Expected Discharge Plan and Services                                               Social Determinants of Health (SDOH) Interventions SDOH Screenings   Food Insecurity: No Food Insecurity (04/16/2022)  Housing: Low Risk  (04/16/2022)  Transportation Needs: No Transportation Needs (04/16/2022)  Utilities: Not At Risk (04/16/2022)  Depression (PHQ2-9): Low Risk  (09/05/2021)  Financial Resource Strain: High Risk (03/24/2017)  Physical Activity: Inactive (03/24/2017)  Social Connections: Somewhat Isolated (03/24/2017)  Stress: No Stress Concern Present (03/24/2017)  Tobacco Use: Low Risk  (04/16/2022)    Readmission Risk Interventions     No data to display

## 2022-04-17 NOTE — Progress Notes (Signed)
[facility]  ROUNDING NOTE   Subjective:   [name] is a 52 year old African-American female with a past medical history of end-stage renal disease, patient is on Tuesday Thursday Saturday schedule, diabetes mellitus, history of pancreatitis who came to the ER with chief complaint of foot pain. She has been admitted for Osteomyelitis of right foot ([place]) [M86.9] Acute hematogenous osteomyelitis of right foot [facility]) [M86.071]  Patient is known to our clinic and receives outpatient dialysis treatments at [facility] on a TTS schedule, supervised by Dr Candiss Norse.   Patient seen sitting up in bed, alert and oriented Currently n.p.o. for scheduled procedure Remains on 2 L nasal cannula Dialysis received yesterday, tolerated treatment well Currently complains of right lower extremity pain  Objective:  Vital signs in last 24 hours:  Temp:  [98 F (36.7 C)-98.7 F (37.1 C)] 98.2 F (36.8 C) (02/21 0743) Pulse Rate:  [99-110] 99 (02/21 0743) Resp:  [16-29] 16 (02/21 0743) BP: (89-129)/(39-78) 102/78 (02/21 0743) SpO2:  [90 %-95 %] 95 % (02/21 0743) Weight:  [122.6 kg-124.1 kg] 122.6 kg (02/20 1931)  Weight change: 0.3 kg Filed Weights   04/16/22 0149 04/16/22 1453 04/16/22 1931  Weight: 128.8 kg 124.1 kg 122.6 kg    Intake/Output: I/O last 3 completed shifts: In: 900 [IV Piggyback:900] Out: 1800 [Other:1800]   Intake/Output this shift:  No intake/output data recorded.  Physical Exam: General: NAD  Head: Normocephalic, atraumatic. Moist oral mucosal membranes  Eyes: Anicteric  Lungs:  Clear to auscultation, normal effort  Heart: Regular rate and rhythm  Abdomen:  Soft, nontender  Extremities:  trace peripheral edema. Right foot ace bandages  Neurologic: Alert and oriented, moving all four extremities  Skin: No lesions  Access: Lt AVF    Basic Metabolic Panel: Recent Labs  Lab 04/15/22 1654 04/16/22 1501  NA 132* 130*  K 3.8 3.9  CL 95* 91*  CO2  19* 19*  GLUCOSE 240* 236*  BUN 65* 76*  CREATININE 8.83* 9.90*  CALCIUM 8.7* 8.7*  PHOS  --  9.2*     Liver Function Tests: Recent Labs  Lab 04/15/22 1654 04/16/22 1501  AST 21  --   ALT 22  --   ALKPHOS 417*  --   BILITOT 0.9  --   PROT 7.4  --   ALBUMIN 3.8 3.6    No results for input(s): "LIPASE", "AMYLASE" in the last 168 hours. No results for input(s): "AMMONIA" in the last 168 hours.  CBC: Recent Labs  Lab 04/15/22 1654 04/16/22 1501 04/17/22 0918  WBC 8.7 8.5 7.6  NEUTROABS 6.9  --  5.9  HGB 11.9* 10.8* 11.4*  HCT 39.5 34.8* 36.3  MCV 97.1 95.9 94.3  PLT 291 268 267     Cardiac Enzymes: No results for input(s): "CKTOTAL", "CKMB", "CKMBINDEX", "TROPONINI" in the last 168 hours.  BNP: Invalid input(s): "POCBNP"  CBG: Recent Labs  Lab 04/16/22 1226 04/16/22 2046 04/16/22 2350 04/17/22 0448 04/17/22 0748  GLUCAP 156* 158* 208* 191* 156*     Microbiology: Results for orders placed or performed during the hospital encounter of 04/15/22  Culture, blood (Routine x 2)     Status: None (Preliminary result)   Collection Time: 04/15/22  4:54 PM   Specimen: BLOOD  Result Value Ref Range Status   Specimen Description BLOOD LEFT ANTECUBITAL  Final   Special Requests   Final    BOTTLES DRAWN AEROBIC AND ANAEROBIC Blood Culture adequate volume   Culture   Final  NO GROWTH < 12 HOURS Performed at [facility], [address] [place], [place] [address]    Report Status PENDING  Incomplete  Culture, blood (Routine x 2)     Status: None (Preliminary result)   Collection Time: 04/15/22  7:24 PM   Specimen: BLOOD  Result Value Ref Range Status   Specimen Description BLOOD RIGHT ANTECUBITAL  Final   Special Requests   Final    BOTTLES DRAWN AEROBIC AND ANAEROBIC Blood Culture adequate volume   Culture   Final    NO GROWTH < 12 HOURS Performed at [facility], [address] [place], [place] [address]    Report Status PENDING   Incomplete  Resp panel by RT-PCR (RSV, Flu A&B, Covid) Anterior Nasal Swab     Status: None   Collection Time: 04/15/22  7:25 PM   Specimen: Anterior Nasal Swab  Result Value Ref Range Status   SARS Coronavirus 2 by RT PCR NEGATIVE NEGATIVE Final    Comment: (NOTE) SARS-CoV-2 target nucleic acids are NOT DETECTED.  The SARS-CoV-2 RNA is generally detectable in upper respiratory specimens during the acute phase of infection. The lowest concentration of SARS-CoV-2 viral copies this assay can detect is 138 copies/mL. A negative result does not preclude SARS-Cov-2 infection and should not be used as the sole basis for treatment or other patient management decisions. A negative result may occur with  improper specimen collection/handling, submission of specimen other than nasopharyngeal swab, presence of viral mutation(s) within the areas targeted by this assay, and inadequate number of viral copies(<138 copies/mL). A negative result must be combined with clinical observations, patient history, and epidemiological information. The expected result is Negative.  Fact Sheet for Patients:  EntrepreneurPulse.com.au  Fact Sheet for Healthcare Providers:  IncredibleEmployment.be  This test is no t yet approved or cleared by the [place] FDA and  has been authorized for detection and/or diagnosis of SARS-CoV-2 by FDA under an Emergency Use Authorization (EUA). This EUA will remain  in effect (meaning this test can be used) for the duration of the COVID-19 declaration under Section 564(b)(1) of the Act, 21 U.S.C.section 360bbb-3(b)(1), unless the authorization is terminated  or revoked sooner.       Influenza A by PCR NEGATIVE NEGATIVE Final   Influenza B by PCR NEGATIVE NEGATIVE Final    Comment: (NOTE) The Xpert Xpress SARS-CoV-2/FLU/RSV plus assay is intended as an aid in the diagnosis of influenza from Nasopharyngeal swab specimens and should  not be used as a sole basis for treatment. Nasal washings and aspirates are unacceptable for Xpert Xpress SARS-CoV-2/FLU/RSV testing.  Fact Sheet for Patients: EntrepreneurPulse.com.au  Fact Sheet for Healthcare Providers: IncredibleEmployment.be  This test is not yet approved or cleared by the [place] FDA and has been authorized for detection and/or diagnosis of SARS-CoV-2 by FDA under an Emergency Use Authorization (EUA). This EUA will remain in effect (meaning this test can be used) for the duration of the COVID-19 declaration under Section 564(b)(1) of the Act, 21 U.S.C. section 360bbb-3(b)(1), unless the authorization is terminated or revoked.     Resp Syncytial Virus by PCR NEGATIVE NEGATIVE Final    Comment: (NOTE) Fact Sheet for Patients: EntrepreneurPulse.com.au  Fact Sheet for Healthcare Providers: IncredibleEmployment.be  This test is not yet approved or cleared by the [place] FDA and has been authorized for detection and/or diagnosis of SARS-CoV-2 by FDA under an Emergency Use Authorization (EUA). This EUA will remain in effect (meaning this test can be  used) for the duration of the COVID-19 declaration under Section 564(b)(1) of the Act, 21 U.S.C. section 360bbb-3(b)(1), unless the authorization is terminated or revoked.  Performed at [facility], [address] [place], [place] [address]     Coagulation Studies: Recent Labs    04/15/22 1654  LABPROT 15.1  INR 1.2     Urinalysis: No results for input(s): "COLORURINE", "LABSPEC", "PHURINE", "GLUCOSEU", "HGBUR", "BILIRUBINUR", "KETONESUR", "PROTEINUR", "UROBILINOGEN", "NITRITE", "LEUKOCYTESUR" in the last 72 hours.  Invalid input(s): "APPERANCEUR"    Imaging: MR FOOT RIGHT WO CONTRAST  Result Date: 04/17/2022 CLINICAL DATA:  Chronic nonhealing right foot ulcers. History of prior amputation. EXAM: MRI OF  THE RIGHT FOREFOOT WITHOUT CONTRAST TECHNIQUE: Multiplanar, multisequence MR imaging of the right forefoot was performed. No intravenous contrast was administered. COMPARISON:  Right foot x-rays from yesterday. MRI right foot dated April 24, 2021. FINDINGS: Bones/Joint/Cartilage Bony destruction of the first through fourth metatarsal heads, bases of the first through fourth proximal phalanges, and head of the first proximal phalanx. There is mild marrow edema and prominent decreased T1 marrow signal associated with the bony destruction. Prior fifth ray amputation no fracture or dislocation. Joint spaces are preserved. No joint effusion. Ligaments Lisfranc ligament is intact. Muscles and Tendons No tenosynovitis. Increased T2 signal within the intrinsic muscles of the forefoot, nonspecific, but likely related to diabetic muscle changes. Soft tissue Large plantar and lateral forefoot soft tissue ulceration extending near bone. Mild dorsal foot soft tissue swelling. No fluid collection. No soft tissue mass. IMPRESSION: 1. Large plantar and lateral forefoot soft tissue ulceration with underlying subacute to chronic osteomyelitis of the first through fourth metatarsal heads, bases of the first through fourth proximal phalanges, and head of the first proximal phalanx. No abscess. Electronically Signed   By: Titus Dubin M.D.   On: 04/17/2022 07:47   DG Foot 2 Views Right  Result Date: 04/15/2022 CLINICAL DATA:  Right foot wounds EXAM: RIGHT FOOT - 2 VIEW COMPARISON:  04/21/2021, MRI 04/24/2021 FINDINGS: Previous fifth transmetatarsal amputation, no acute findings at this location. Interval progression of bony destructive change involving the distal fourth metatarsal and base of the fourth proximal phalanx. Osseous destructive change involving the head of third metatarsal and base of third proximal phalanx. Similar findings involving head of second metatarsal and base of second proximal phalanx with probable  pathologic fracture at the head of second metatarsal. Widening of the first MTP joint with erosive changes at the base of first proximal phalanx and head of first metatarsal also concerning for septic arthritis and osteomyelitis. Small erosion head of the first proximal phalanx. Vascular calcifications. Multiple ulcerations along the plantar surface of the foot. No frank soft tissue gas. IMPRESSION: 1. Previous fifth transmetatarsal amputation. 2. Interval progression of bony destructive change involving the distal second third and fourth metatarsals as well as the second third and fourth proximal phalanges consistent with osteomyelitis. 3. Widening of the first MTP joint with erosive changes at the base of first proximal phalanx and head of first metatarsal consistent with septic joint and osteomyelitis. New erosive changes at the head of the first proximal phalanx also consistent with osteomyelitis. Electronically Signed   By: Donavan Foil M.D.   On: 04/15/2022 20:45   DG Chest 2 View  Result Date: 04/15/2022 CLINICAL DATA:  Sepsis EXAM: CHEST - 2 VIEW COMPARISON:  X-ray 12/02/2021 and older FINDINGS: Calcifications of the tracheobronchial tree. Underinflation with some basilar atelectasis or scar. Enlarged cardiopericardial silhouette with increasing interstitial changes. Peribronchial  thickening. No pneumothorax. Vascular calcifications. Films are under penetrated IMPRESSION: Enlarged cardiopericardial silhouette with vascular congestion and interstitial changes. Acute process is possible. Please correlate with symptoms and recommend follow-up Electronically Signed   By: Jill Side M.D.   On: 04/15/2022 17:26     Medications:    sodium chloride      ceFAZolin (ANCEF) IV     cefTRIAXone (ROCEPHIN)  IV 2 g (04/16/22 2336)   vancomycin Stopped (04/16/22 1903)    (feeding supplement) PROSource Plus  30 mL Oral BID BM   aspirin EC  81 mg Oral Daily   atorvastatin  40 mg Oral Daily   budesonide   0.5 mg Nebulization BID   Chlorhexidine Gluconate Cloth  6 each Topical Q0600   Chlorhexidine Gluconate Cloth  6 each Topical Once   heparin  5,000 Units Subcutaneous Q8H   insulin aspart  0-6 Units Subcutaneous Q4H   insulin glargine-yfgn  15 Units Subcutaneous Daily   ipratropium-albuterol  3 mL Nebulization Q6H   metroNIDAZOLE  500 mg Oral Q8H   midodrine  10 mg Oral BID   multivitamin  1 tablet Oral QHS   nutrition supplement (JUVEN)  1 packet Oral BID BM   sevelamer carbonate  2,400 mg Oral TID WC   acetaminophen **OR** acetaminophen, albuterol, albuterol, diphenhydrAMINE, diphenhydrAMINE, famotidine, fentaNYL (SUBLIMAZE) injection, methylPREDNISolone (SOLU-MEDROL) injection, midazolam, ondansetron **OR** ondansetron (ZOFRAN) IV, oxyCODONE-acetaminophen  Assessment/ Plan:  [name] is a 52 y.o.  female with a past medical history of end-stage renal disease, patient is on Tuesday Thursday Saturday schedule, diabetes mellitus, history of pancreatitis who came to the ER with chief complaint of foot pain. She has been admitted for Osteomyelitis of right foot ([place]) [M86.9] Acute hematogenous osteomyelitis of right foot ([place]) [M86.071]   [facility] TTS Left AVF 127.5kg   End stage renal disease on hemodialysis. Dialysis received yesterday, UF 1.8L achieved. Next treatment scheduled for Thursday.   2. Anemia of chronic kidney disease Lab Results  Component Value Date   HGB 11.4 (L) 04/17/2022    Hemoglobin remains acceptable. Patient receives [facility] outpatient.   3. Secondary Hyperparathyroidism: with outpatient labs: PTH 1243, phosphorus 11.9, calcium 8.9 on 07/03/21.   Lab Results  Component Value Date   PTH 134 (H) 04/21/2017   CALCIUM 8.7 (L) 04/16/2022   CAION 0.81 (LL) 10/26/2020   PHOS 9.2 (H) 04/16/2022    Awaiting a.m. labs.  Will continue to monitor bone minerals during this admission.  Phosphorus elevated.  Continue sevelamer 2400 mg with  meals.  4. Diabetes mellitus type II with chronic kidney disease/renal manifestations: noninsulin dependent. Most recent hemoglobin A1c is 7.3 on 04/16/22.   5. Osteomyelitis, right foot.  Appreciate vascular and podiatry input.  Patient scheduled for right lower extremity angiogram today with vascular.  Antibiotics managed by primary team.   LOS: 2 [place] 2/21/202410:02 AM

## 2022-04-17 NOTE — Progress Notes (Signed)
Family at bedside, pt eating ice chips, Dr Lucky Cowboy talking to pt and family

## 2022-04-17 NOTE — Progress Notes (Signed)
PT Cancellation Note  Patient Details Name: [name] MRN: [id number] DOB: 09/03/1970   Cancelled Treatment:    Reason Eval/Treat Not Completed: Other (comment) Chart reviewed, went to pt's room prior to today's angiogram.  Encouraged some light mobility, she reports she has been w/c bound for the last few months but feels that she is able to transfer, etc well and safely without R WBing.  She did not wish to do any PT before procedure.  Per conversation with OT who attempted after procedure pt has no interest in working with therapy today.  Will maintain on caseload and attempt to see as appropriate.    Kreg Shropshire, DPT 04/17/2022, 5:43 PM

## 2022-04-17 NOTE — Plan of Care (Signed)

## 2022-04-17 NOTE — Interval H&P Note (Signed)
History and Physical Interval Note:  04/17/2022 11:45 AM  [name]  has presented today for surgery, with the diagnosis of PAD with Ulceration.  The various methods of treatment have been discussed with the patient and family. After consideration of risks, benefits and other options for treatment, the patient has consented to  Procedure(s): Lower Extremity Angiography (Right) as a surgical intervention.  The patient's history has been reviewed, patient examined, no change in status, stable for surgery.  I have reviewed the patient's chart and labs.  Questions were answered to the patient's satisfaction.     [name]

## 2022-04-17 NOTE — Progress Notes (Signed)
OT Cancellation Note  Patient Details Name: [name] MRN: [id number] DOB: 09-Jul-1970   Cancelled Treatment:    Reason Eval/Treat Not Completed: Other (comment). Pt returned from procedure. RN reports pt on bed rest until 3:30 pm today. After author introducing self, pt very clear that she is "not doing any therapy today." She states that people have been in and out of her room all day and she endorsed fatigue from procedure. Will re-attempt OT evaluation at later date/time.  Doneta Public 04/17/2022, 3:12 PM

## 2022-04-17 NOTE — Progress Notes (Signed)
Inpatient Diabetes Program Recommendations  AACE/ADA: New Consensus Statement on Inpatient Glycemic Control (2015)  Target Ranges:  Prepandial:   less than 140 mg/dL      Peak postprandial:   less than 180 mg/dL (1-2 hours)      Critically ill patients:  140 - 180 mg/dL   Lab Results  Component Value Date   GLUCAP 156 (H) 04/17/2022   HGBA1C 7.3 (H) 04/16/2022    Review of Glycemic Control  Latest Reference Range & Units 04/16/22 23:50 04/17/22 04:48 04/17/22 07:48  Glucose-Capillary 70 - 99 mg/dL 208 (H) 191 (H) 156 (H)   Diabetes history: DM 2 Outpatient Diabetes medications:  Tresiba 30 units daily Humalog Current orders for Inpatient glycemic control:  Novolog 0-6 units q 4 hours Semglee 15 units daily  Inpatient Diabetes Program Recommendations:    Blood sugars improved today.  A1C is close to target especially with ESRD.  Patient was on insulin prior to admit.  Does not appear to have any educational needs at this time. Will follow.   Thanks,  Adah Perl, RN, BC-ADM Inpatient Diabetes Coordinator Pager [phone]  (8a-5p)

## 2022-04-17 NOTE — Progress Notes (Addendum)
PROGRESS NOTE    [name]  [id number] DOB: Jul 20, 1970 DOA: 04/15/2022 PCP: Evern Bio, NP   Brief Narrative: 52 year old with past medical history significant for hypertension, COPD on 3 to 4 L of oxygen, ESRD on hemodialysis TTS, diabetes type 2, OSA, prior osteomyelitis right foot status post recent weight amputation with and with chronic nonhealing ulcer, followed at wound care center, last debrided on 2/12, sent to the ED with concern for gangrene and osteomyelitis of the right foot.  Nephrology and podiatry and vascular surgery consulted.  Vascular perform angiogram 2/21, right lower extremity demonstrates significant calcification of her vessels.  There is no significant stenosis in the right common femoral artery, profunda femoris artery, superficial femoral artery or popliteal artery.  The anterior tibial artery was large and patent.  The posterior tibial artery provided a second runoff vessel  to the foot, it was a smaller but not stenotic until  there was intrinsic diseases in the mid to distal foot portion of the posterior tibial artery.  The peroneal artery was small and did not appear to continue on the way to the ankle.   Assessment & Plan:   Principal Problem:   Osteomyelitis of right foot (HCC) Active Problems:   High anion gap metabolic acidosis   ESRD on hemodialysis ([place])   Uncontrolled type 2 diabetes mellitus with hyperglycemia, with long-term current use of insulin (HCC)   Hypertension   COPD exacerbation (HCC)   Chronic diastolic heart failure (HCC)   Obesity, Class III, BMI 40-49.9 (morbid obesity) ([place])   Obesity hypoventilation syndrome (HCC)   Chronic respiratory failure with hypoxia and hypercapnia (HCC)   PVD (peripheral vascular disease) (HCC)  1-Osteomyelitis of the right foot:  Peripheral vascular disease s/p prior history of with amputation of the fifth ray  right foot amputation  -2/21 MRI: Large plantar and lateral forefoot soft tissue  ulceration with underlying subacute to chronic osteomyelitis of the first through fourth metatarsal heads, bases of the first through fourth proximal phalanges, and head of the first proximal phalanx. No abscess. -No septic joint on MRI -2/21 S/P Arteriogram: Showed posterior tibial artery provided a second runoff vessel  to the foot, it was a smaller but not stenotic until  there was intrinsic diseases in the mid to distal foot portion of the posterior tibial artery. -Follow up Podiatry recommendation -Continue with IV vancomycin and ceftriaxone  2-ESRD on hemodialysis -Metabolic acidosis -Management with hemodialysis.  -Patient had hemodialysis 2/20  3-uncontrolled type 2 diabetes with hyperglycemia, long-term use of insulin Continue with a sliding scale and basal insulin  Chronic diastolic heart failure: euvolemic  COPD exacerbation Obesity hypoventilation syndrome Respiratory failure with hypoxia, on 3-4 L oxygen at baseline.  Mild exacerbation With DuoNeb, Pulmicort, resume Brovana Continue as needed nebulizer and is  Hypertension : Hold metoprolol  Has hypotension on midodrine.   PVD; Vascular consulted and following.   Morbid obesity class III BMI 45 Needs lifestyle modification    Nutrition Problem: Increased nutrient needs Etiology: wound healing    Signs/Symptoms: estimated needs    Interventions: MVI, Prostat, Juven  Estimated body mass index is 45.68 kg/m as calculated from the following:   Height as of this encounter: 5' 4.5" (1.638 m).   Weight as of this encounter: 122.6 kg.   DVT prophylaxis: Heparin Code Status: Full code Family Communication: Care discussed with patient Disposition Plan:  Status is: Inpatient Remains inpatient appropriate because: management of foot infection.     Consultants:  Vascular  Nephrology  Podiatry   Procedures:  Arteriogram.   Antimicrobials:    Subjective: She is alert, breathing  ok.     Objective: Vitals:   04/16/22 2251 04/17/22 0139 04/17/22 0743 04/17/22 0743  BP: 113/68   102/78  Pulse: (!) 103   99  Resp: 20   16  Temp: 98.3 F (36.8 C)   98.2 F (36.8 C)  TempSrc: Oral     SpO2: 93% 93% 93% 95%  Weight:      Height:        Intake/Output Summary (Last 24 hours) at 04/17/2022 0816 Last data filed at 04/17/2022 0400 Gross per 24 hour  Intake 307.69 ml  Output 1800 ml  Net -1492.31 ml   Filed Weights   04/16/22 0149 04/16/22 1453 04/16/22 1931  Weight: 128.8 kg 124.1 kg 122.6 kg    Examination:  General exam: Appears calm and comfortable  Respiratory system: Clear to auscultation. Respiratory effort normal. Cardiovascular system: S1 & S2 heard, RRR.  Gastrointestinal system: Abdomen is nondistended, soft and nontender. No organomegaly or masses felt. Normal bowel sounds heard. Central nervous system: Alert and oriented.  Extremities: right foot with dressing    Data Reviewed: I have personally reviewed following labs and imaging studies  CBC: Recent Labs  Lab 04/15/22 1654 04/16/22 1501  WBC 8.7 8.5  NEUTROABS 6.9  --   HGB 11.9* 10.8*  HCT 39.5 34.8*  MCV 97.1 95.9  PLT 291 [id number]   Basic Metabolic Panel: Recent Labs  Lab 04/15/22 1654 04/16/22 1501  NA 132* 130*  K 3.8 3.9  CL 95* 91*  CO2 19* 19*  GLUCOSE 240* 236*  BUN 65* 76*  CREATININE 8.83* 9.90*  CALCIUM 8.7* 8.7*  PHOS  --  9.2*   GFR: Estimated Creatinine Clearance: 8.9 mL/min (A) (by C-G formula based on SCr of 9.9 mg/dL (H)). Liver Function Tests: Recent Labs  Lab 04/15/22 1654 04/16/22 1501  AST 21  --   ALT 22  --   ALKPHOS 417*  --   BILITOT 0.9  --   PROT 7.4  --   ALBUMIN 3.8 3.6   No results for input(s): "LIPASE", "AMYLASE" in the last 168 hours. No results for input(s): "AMMONIA" in the last 168 hours. Coagulation Profile: Recent Labs  Lab 04/15/22 1654  INR 1.2   Cardiac Enzymes: No results for input(s): "CKTOTAL", "CKMB", "CKMBINDEX",  "TROPONINI" in the last 168 hours. BNP (last 3 results) No results for input(s): "PROBNP" in the last 8760 hours. HbA1C: Recent Labs    04/16/22 0215  HGBA1C 7.3*   CBG: Recent Labs  Lab 04/16/22 1226 04/16/22 2046 04/16/22 2350 04/17/22 0448 04/17/22 0748  GLUCAP 156* 158* 208* 191* 156*   Lipid Profile: No results for input(s): "CHOL", "HDL", "LDLCALC", "TRIG", "CHOLHDL", "LDLDIRECT" in the last 72 hours. Thyroid Function Tests: No results for input(s): "TSH", "T4TOTAL", "FREET4", "T3FREE", "THYROIDAB" in the last 72 hours. Anemia Panel: No results for input(s): "VITAMINB12", "FOLATE", "FERRITIN", "TIBC", "IRON", "RETICCTPCT" in the last 72 hours. Sepsis Labs: Recent Labs  Lab 04/15/22 1654 04/15/22 1925  LATICACIDVEN 1.6 1.6    Recent Results (from the past 240 hour(s))  Culture, blood (Routine x 2)     Status: None (Preliminary result)   Collection Time: 04/15/22  4:54 PM   Specimen: BLOOD  Result Value Ref Range Status   Specimen Description BLOOD LEFT ANTECUBITAL  Final   Special Requests   Final    BOTTLES DRAWN AEROBIC AND  ANAEROBIC Blood Culture adequate volume   Culture   Final    NO GROWTH < 12 HOURS Performed at [facility], [address] [place], [place] [address]    Report Status PENDING  Incomplete  Culture, blood (Routine x 2)     Status: None (Preliminary result)   Collection Time: 04/15/22  7:24 PM   Specimen: BLOOD  Result Value Ref Range Status   Specimen Description BLOOD RIGHT ANTECUBITAL  Final   Special Requests   Final    BOTTLES DRAWN AEROBIC AND ANAEROBIC Blood Culture adequate volume   Culture   Final    NO GROWTH < 12 HOURS Performed at [facility], [address], [place], [place] [address]    Report Status PENDING  Incomplete  Resp panel by RT-PCR (RSV, Flu A&B, Covid) Anterior Nasal Swab     Status: None   Collection Time: 04/15/22  7:25 PM   Specimen: Anterior Nasal Swab  Result Value Ref Range  Status   SARS Coronavirus 2 by RT PCR NEGATIVE NEGATIVE Final    Comment: (NOTE) SARS-CoV-2 target nucleic acids are NOT DETECTED.  The SARS-CoV-2 RNA is generally detectable in upper respiratory specimens during the acute phase of infection. The lowest concentration of SARS-CoV-2 viral copies this assay can detect is 138 copies/mL. A negative result does not preclude SARS-Cov-2 infection and should not be used as the sole basis for treatment or other patient management decisions. A negative result may occur with  improper specimen collection/handling, submission of specimen other than nasopharyngeal swab, presence of viral mutation(s) within the areas targeted by this assay, and inadequate number of viral copies(<138 copies/mL). A negative result must be combined with clinical observations, patient history, and epidemiological information. The expected result is Negative.  Fact Sheet for Patients:  EntrepreneurPulse.com.au  Fact Sheet for Healthcare Providers:  IncredibleEmployment.be  This test is no t yet approved or cleared by the [place] FDA and  has been authorized for detection and/or diagnosis of SARS-CoV-2 by FDA under an Emergency Use Authorization (EUA). This EUA will remain  in effect (meaning this test can be used) for the duration of the COVID-19 declaration under Section 564(b)(1) of the Act, 21 U.S.C.section 360bbb-3(b)(1), unless the authorization is terminated  or revoked sooner.       Influenza A by PCR NEGATIVE NEGATIVE Final   Influenza B by PCR NEGATIVE NEGATIVE Final    Comment: (NOTE) The Xpert Xpress SARS-CoV-2/FLU/RSV plus assay is intended as an aid in the diagnosis of influenza from Nasopharyngeal swab specimens and should not be used as a sole basis for treatment. Nasal washings and aspirates are unacceptable for Xpert Xpress SARS-CoV-2/FLU/RSV testing.  Fact Sheet for  Patients: EntrepreneurPulse.com.au  Fact Sheet for Healthcare Providers: IncredibleEmployment.be  This test is not yet approved or cleared by the [place] FDA and has been authorized for detection and/or diagnosis of SARS-CoV-2 by FDA under an Emergency Use Authorization (EUA). This EUA will remain in effect (meaning this test can be used) for the duration of the COVID-19 declaration under Section 564(b)(1) of the Act, 21 U.S.C. section 360bbb-3(b)(1), unless the authorization is terminated or revoked.     Resp Syncytial Virus by PCR NEGATIVE NEGATIVE Final    Comment: (NOTE) Fact Sheet for Patients: EntrepreneurPulse.com.au  Fact Sheet for Healthcare Providers: IncredibleEmployment.be  This test is not yet approved or cleared by the [place] FDA and has been authorized for detection and/or diagnosis of SARS-CoV-2 by FDA under an Emergency  Use Authorization (EUA). This EUA will remain in effect (meaning this test can be used) for the duration of the COVID-19 declaration under Section 564(b)(1) of the Act, 21 U.S.C. section 360bbb-3(b)(1), unless the authorization is terminated or revoked.  Performed at [facility], [address] [place], [place] [address]          Radiology Studies: MR FOOT RIGHT WO CONTRAST  Result Date: 04/17/2022 CLINICAL DATA:  Chronic nonhealing right foot ulcers. History of prior amputation. EXAM: MRI OF THE RIGHT FOREFOOT WITHOUT CONTRAST TECHNIQUE: Multiplanar, multisequence MR imaging of the right forefoot was performed. No intravenous contrast was administered. COMPARISON:  Right foot x-rays from yesterday. MRI right foot dated April 24, 2021. FINDINGS: Bones/Joint/Cartilage Bony destruction of the first through fourth metatarsal heads, bases of the first through fourth proximal phalanges, and head of the first proximal phalanx. There is mild marrow  edema and prominent decreased T1 marrow signal associated with the bony destruction. Prior fifth ray amputation no fracture or dislocation. Joint spaces are preserved. No joint effusion. Ligaments Lisfranc ligament is intact. Muscles and Tendons No tenosynovitis. Increased T2 signal within the intrinsic muscles of the forefoot, nonspecific, but likely related to diabetic muscle changes. Soft tissue Large plantar and lateral forefoot soft tissue ulceration extending near bone. Mild dorsal foot soft tissue swelling. No fluid collection. No soft tissue mass. IMPRESSION: 1. Large plantar and lateral forefoot soft tissue ulceration with underlying subacute to chronic osteomyelitis of the first through fourth metatarsal heads, bases of the first through fourth proximal phalanges, and head of the first proximal phalanx. No abscess. Electronically Signed   By: Titus Dubin M.D.   On: 04/17/2022 07:47   DG Foot 2 Views Right  Result Date: 04/15/2022 CLINICAL DATA:  Right foot wounds EXAM: RIGHT FOOT - 2 VIEW COMPARISON:  04/21/2021, MRI 04/24/2021 FINDINGS: Previous fifth transmetatarsal amputation, no acute findings at this location. Interval progression of bony destructive change involving the distal fourth metatarsal and base of the fourth proximal phalanx. Osseous destructive change involving the head of third metatarsal and base of third proximal phalanx. Similar findings involving head of second metatarsal and base of second proximal phalanx with probable pathologic fracture at the head of second metatarsal. Widening of the first MTP joint with erosive changes at the base of first proximal phalanx and head of first metatarsal also concerning for septic arthritis and osteomyelitis. Small erosion head of the first proximal phalanx. Vascular calcifications. Multiple ulcerations along the plantar surface of the foot. No frank soft tissue gas. IMPRESSION: 1. Previous fifth transmetatarsal amputation. 2. Interval  progression of bony destructive change involving the distal second third and fourth metatarsals as well as the second third and fourth proximal phalanges consistent with osteomyelitis. 3. Widening of the first MTP joint with erosive changes at the base of first proximal phalanx and head of first metatarsal consistent with septic joint and osteomyelitis. New erosive changes at the head of the first proximal phalanx also consistent with osteomyelitis. Electronically Signed   By: Donavan Foil M.D.   On: 04/15/2022 20:45   DG Chest 2 View  Result Date: 04/15/2022 CLINICAL DATA:  Sepsis EXAM: CHEST - 2 VIEW COMPARISON:  X-ray 12/02/2021 and older FINDINGS: Calcifications of the tracheobronchial tree. Underinflation with some basilar atelectasis or scar. Enlarged cardiopericardial silhouette with increasing interstitial changes. Peribronchial thickening. No pneumothorax. Vascular calcifications. Films are under penetrated IMPRESSION: Enlarged cardiopericardial silhouette with vascular congestion and interstitial changes. Acute process is possible. Please correlate with symptoms  and recommend follow-up Electronically Signed   By: Jill Side M.D.   On: 04/15/2022 17:26        Scheduled Meds:  (feeding supplement) PROSource Plus  30 mL Oral BID BM   aspirin EC  81 mg Oral Daily   atorvastatin  40 mg Oral Daily   budesonide  0.5 mg Nebulization BID   Chlorhexidine Gluconate Cloth  6 each Topical Q0600   Chlorhexidine Gluconate Cloth  6 each Topical Once   heparin  5,000 Units Subcutaneous Q8H   insulin aspart  0-6 Units Subcutaneous Q4H   insulin glargine-yfgn  15 Units Subcutaneous Daily   ipratropium-albuterol  3 mL Nebulization Q6H   metroNIDAZOLE  500 mg Oral Q8H   midodrine  10 mg Oral BID   multivitamin  1 tablet Oral QHS   nutrition supplement (JUVEN)  1 packet Oral BID BM   sevelamer carbonate  2,400 mg Oral TID WC   Continuous Infusions:  sodium chloride      ceFAZolin (ANCEF) IV      cefTRIAXone (ROCEPHIN)  IV 2 g (04/16/22 2336)   vancomycin Stopped (04/16/22 1903)     LOS: 2 days    Time spent: 35 minutes    Hazelgrace Bonham A Malene Blaydes, MD [facility]   If 7PM-7AM, please contact night-coverage [email or web address]  04/17/2022, 8:16 AM

## 2022-04-17 NOTE — Op Note (Signed)
[facility]  Percutaneous Study/Intervention Procedural Note   Date of Surgery: 04/17/2022  Surgeon(s):Carmencita Cusic    Assistants:none  Pre-operative Diagnosis: PAD with ulceration and infection of the right foot  Post-operative diagnosis:  Same  Procedure(s) Performed:             1.  Ultrasound guidance for vascular access left femoral artery             2.  Catheter placement into right SFA from left femoral approach             3.  Aortogram and selective right lower extremity angiogram             4.  StarClose closure device left femoral artery  EBL: 5 cc  Contrast: 30 cc  Fluoro Time: 1.2 minutes  Moderate Conscious Sedation Time: approximately 32 minutes using 3 mg of Versed and 75 mcg of Fentanyl              Indications:  Patient is a 52 y.o.female with ulceration and infection of the right foot with renal failure, diabetes, and multiple atherosclerotic risk factors. The patient is brought in for angiography for further evaluation and potential treatment.  Due to the limb threatening nature of the situation, angiogram was performed for attempted limb salvage. The patient is aware that if the procedure fails, amputation would be expected.  The patient also understands that even with successful revascularization, amputation may still be required due to the severity of the situation.  Risks and benefits are discussed and informed consent is obtained.   Procedure:  The patient was identified and appropriate procedural time out was performed.  The patient was then placed supine on the table and prepped and draped in the usual sterile fashion. Moderate conscious sedation was administered during a face to face encounter with the patient throughout the procedure with my supervision of the RN administering medicines and monitoring the patient's vital signs, pulse oximetry, telemetry and mental status throughout from the start of the procedure until the patient was  taken to the recovery room. Ultrasound was used to evaluate the left common femoral artery.  It was patent .  A digital ultrasound image was acquired.  A Seldinger needle was used to access the left common femoral artery under direct ultrasound guidance and a permanent image was performed.  A 0.035 J wire was advanced without resistance and a 5Fr sheath was placed.  Pigtail catheter was placed into the aorta and an AP aortogram was performed. This demonstrated normal renal arteries and normal aorta and iliac segments without significant stenosis. I then crossed the aortic bifurcation and advanced to the right femoral head and then into the right superficial femoral artery to help opacify distally. Selective right lower extremity angiogram was then performed. This demonstrated significant calcification of her vessels.  There is no significant stenosis in the right common femoral artery, profunda femoris artery, superficial femoral artery, or popliteal artery.  There is a typical tibial trifurcation.  The anterior tibial artery was the dominant runoff and was large and patent all the way into the foot and had the best filling of the foot.  The posterior tibial artery provided a second runoff vessel to the foot.  It was smaller but not stenotic until there was intrinsic disease in the mid to distal foot portion of the posterior tibial artery.  The peroneal artery was small and did not appear to continue all the way to the ankle.  The patient has adequate blood flow for debridement or surgery at any level and there is no role for endovascular revascularization with these findings.  I elected to terminate the procedure. The sheath was removed and StarClose closure device was deployed in the left femoral artery with excellent hemostatic result. The patient was taken to the recovery room in stable condition having tolerated the procedure well.  Findings:               Aortogram:  This demonstrated normal renal arteries  and normal aorta and iliac segments without significant stenosis.             Right Lower Extremity:  This demonstrated significant calcification of her vessels.  There is no significant stenosis in the right common femoral artery, profunda femoris artery, superficial femoral artery, or popliteal artery.  There is a typical tibial trifurcation.  The anterior tibial artery was the dominant runoff and was large and patent all the way into the foot and had the best filling of the foot.  The posterior tibial artery provided a second runoff vessel to the foot.  It was smaller but not stenotic until there was intrinsic disease in the mid to distal foot portion of the posterior tibial artery.  The peroneal artery was small and did not appear to continue all the way to the ankle.   Disposition: Patient was taken to the recovery room in stable condition having tolerated the procedure well.  Complications: None  [name] 04/17/2022 1:11 PM   This note was created with Dragon Medical transcription system. Any errors in dictation are purely unintentional.

## 2022-04-18 ENCOUNTER — Encounter: Payer: Self-pay | Admitting: Oncology

## 2022-04-18 ENCOUNTER — Encounter: Payer: Self-pay | Admitting: Vascular Surgery

## 2022-04-18 DIAGNOSIS — M86671 Other chronic osteomyelitis, right ankle and foot: Secondary | ICD-10-CM

## 2022-04-18 DIAGNOSIS — T148XXA Other injury of unspecified body region, initial encounter: Secondary | ICD-10-CM

## 2022-04-18 LAB — RENAL FUNCTION PANEL
Albumin: 3.6 g/dL (ref 3.5–5.0)
Anion gap: 17 — ABNORMAL HIGH (ref 5–15)
BUN: 62 mg/dL — ABNORMAL HIGH (ref 6–20)
CO2: 19 mmol/L — ABNORMAL LOW (ref 22–32)
Calcium: 8.6 mg/dL — ABNORMAL LOW (ref 8.9–10.3)
Chloride: 94 mmol/L — ABNORMAL LOW (ref 98–111)
Creatinine, Ser: 8.82 mg/dL — ABNORMAL HIGH (ref 0.44–1.00)
GFR, Estimated: 5 mL/min — ABNORMAL LOW (ref >60)
Glucose, Bld: 132 mg/dL — ABNORMAL HIGH (ref 70–99)
Phosphorus: 8.5 mg/dL — ABNORMAL HIGH (ref 2.5–4.6)
Potassium: 4.1 mmol/L (ref 3.5–5.1)
Sodium: 130 mmol/L — ABNORMAL LOW (ref 135–145)

## 2022-04-18 LAB — GLUCOSE, CAPILLARY
Glucose-Capillary: 114 mg/dL — ABNORMAL HIGH (ref 70–99)
Glucose-Capillary: 141 mg/dL — ABNORMAL HIGH (ref 70–99)
Glucose-Capillary: 184 mg/dL — ABNORMAL HIGH (ref 70–99)
Glucose-Capillary: 186 mg/dL — ABNORMAL HIGH (ref 70–99)
Glucose-Capillary: 254 mg/dL — ABNORMAL HIGH (ref 70–99)
Glucose-Capillary: 271 mg/dL — ABNORMAL HIGH (ref 70–99)

## 2022-04-18 LAB — CBC
HCT: 36.4 % (ref 36.0–46.0)
Hemoglobin: 11.3 g/dL — ABNORMAL LOW (ref 12.0–15.0)
MCH: 29.3 pg (ref 26.0–34.0)
MCHC: 31 g/dL (ref 30.0–36.0)
MCV: 94.3 fL (ref 80.0–100.0)
Platelets: 260 10*3/uL (ref 150–400)
RBC: 3.86 MIL/uL — ABNORMAL LOW (ref 3.87–5.11)
RDW: 13.8 % (ref 11.5–15.5)
WBC: 12.1 10*3/uL — ABNORMAL HIGH (ref 4.0–10.5)
nRBC: 0 % (ref 0.0–0.2)

## 2022-04-18 MED ORDER — HYDROMORPHONE HCL 1 MG/ML IJ SOLN
0.5000 mg | INTRAMUSCULAR | Status: DC | PRN
  Administered 2022-04-18 – 2022-04-20 (×8): 0.5 mg via INTRAVENOUS
  Filled 2022-04-18 (×8): qty 1

## 2022-04-18 MED ORDER — OXYCODONE-ACETAMINOPHEN 5-325 MG PO TABS
ORAL_TABLET | ORAL | Status: AC
  Filled 2022-04-18: qty 1

## 2022-04-18 MED ORDER — LINACLOTIDE 145 MCG PO CAPS
290.0000 ug | ORAL_CAPSULE | Freq: Every morning | ORAL | Status: DC
  Administered 2022-04-18 – 2022-04-20 (×3): 290 ug via ORAL
  Filled 2022-04-18: qty 1
  Filled 2022-04-18 (×3): qty 2

## 2022-04-18 NOTE — Consult Note (Addendum)
NAME: MIABELLE LIEBERMAN  DOB: [birth year]  MRN: [id number]  Date/Time: 04/18/2022 5:14 PM  REQUESTING PROVIDER: Dr.Regalado Subjective:  REASON FOR CONSULT: Rt foot infection ?Pt well known to me for the same problem EUNICE ZERN is a 52 y.o. with a history of DM, PAD right DFI, right foot dry gangrene ESRD on dialysis , CHF, HTN,  presents from wound clinic with worsening rt foot infection Pt has had this since Aug 2022 Patient in August  2022 was in the hospital with right diabetic foot infection with necrosis and osteomyelitis.  She underwent amputation of the fifth ray on 10/21/2020.  This was followed by a washout procedure on 10/26/2020.  As the foot did not appear salvageable amputation was recommended by podiatrist.  She declined.  She took IV vancomycin and cefepime for 8 weeks until 12/19/2020 during dialysis. Then in Feb 2023 she presented to [facility] ED with abdominal pain  and was diagnosed with pancreatitis. The rt foot had chronic gangrenous changes with MRI showing osteo of 4th met head , podiatrist recommended amputation and she declined a biopsy of the bone from the fourth metatarsal and the fourth toe and the pathology came back as viable bone without evidence of acute osteomyelitis in a the sample. The culture had Candida parapsilosis she was seen by ID and was sent on p.o. fluconazole and IV vancomycin and cefepime during dialysis for total of 6 weeks. I last saw her April 2023 when the foot appeared stable and she was followed by wound clinic- She got 1 hyperbaric treatment but could not tolerate due to claustrophobia  Aug 2022                 April 2023   Pt has no fever or chills In the ED vitals on 04/15/22  04/15/22  BP 110/63  Temp 98 F (36.7 C)  Pulse Rate 107 !  Resp 20  SpO2 95 %  O2 Flow Rate (L/min) 3 L/min  Weight 272 lb 14.9 oz  Height '5\' 4"'$  (1.626 m)  BMI (Calculated) 46.83    Latest Reference Range & Units 04/15/22  WBC 4.0 - 10.5 K/uL 8.7   Hemoglobin 12.0 - 15.0 g/dL 11.9 (L)  HCT 36.0 - 46.0 % 39.5  Platelets 150 - 400 K/uL 291  Creatinine 0.44 - 1.00 mg/dL 8.83 (H)  MRI foot  Large plantar and lateral forefoot soft tissue ulceration with underlying subacute to chronic osteomyelitis of the first through fourth metatarsal heads, bases of the first through fourth proximal phalanges, and head of the first proximal phalanx. No abscess. Started on vanco , ceftriaxone and flagyl  Past Medical History:  Diagnosis Date   (HFpEF) heart failure with preserved ejection fraction ([place])    a. 11/2014 Echo: EF 50-55%, Gr1 DD; b. 06/2016 Echo: EF 60-65%, no rwma, mod dil LA, nl RV, triv eff.   Anxiety    Asthma    Bronchitis    CHF (congestive heart failure) (HCC)    Chronic cough    Chronic respiratory failure with hypoxia and hypercapnia ([place])    a. Home O2.  (2-4L)   CKD (chronic kidney disease), stage III (HCC)    Depression    Diabetes mellitus without complication (HCC)    Diverticulosis    Dyspnea    Dysrhythmia    per pt, no arrythmias   Environmental and seasonal allergies    Extreme obesity with alveolar hypoventilation (HCC)    Hypertension    Iron deficiency anemia  a. chronic blood loss->heavy menses.   Kidney failure    dialysis tues/thur/sat   ... left arm shunt   Orthopnea    Oxygen dependent    3l/min   Pericardial effusion    a. 11/2014 CT Chest: Mod effusion; b. 11/2014 Echo: small to mod circumferential effusion. No hemodynamic compromise.   Pneumonia    history   Poorly controlled diabetes mellitus ([place])    a. 11/2014 A1c 13.2.   Swelling of both lower extremities     Past Surgical History:  Procedure Laterality Date   A/V FISTULAGRAM Left 06/09/2017   Procedure: A/V FISTULAGRAM;  Surgeon: Algernon Huxley, MD;  Location: [place] [facility];  Service: Cardiovascular;  Laterality: Left;   A/V FISTULAGRAM Left 06/16/2017   Procedure: A/V FISTULAGRAM;  Surgeon: Algernon Huxley, MD;  Location: [place] [facility];  Service: Cardiovascular;  Laterality: Left;   A/V FISTULAGRAM Left 01/28/2018   Procedure: A/V FISTULAGRAM;  Surgeon: Algernon Huxley, MD;  Location: [place] [facility];  Service: Cardiovascular;  Laterality: Left;   AMPUTATION TOE Right 10/21/2020   Procedure: AMPUTATION TOE-5th Digit Amputation Right Foot;  Surgeon: Edrick Kins, DPM;  Location: [facility];  Service: Podiatry;  Laterality: Right;   AV FISTULA PLACEMENT Left 04/25/2017   Procedure: ARTERIOVENOUS (AV) FISTULA CREATION;  Surgeon: Algernon Huxley, MD;  Location: [facility];  Service: Vascular;  Laterality: Left;   BONE BIOPSY Right 04/27/2021   Procedure: Right 4th metatarsal BONE BIOPSY;  Surgeon: [name], DPM;  Location: [facility];  Service: Podiatry;  Laterality: Right;   CATARACT EXTRACTION W/PHACO Left 01/21/2018   Procedure: CATARACT EXTRACTION PHACO AND INTRAOCULAR LENS PLACEMENT (IOC);  Surgeon: Leandrew Koyanagi, MD;  Location: [facility];  Service: Ophthalmology;  Laterality: Left;  [place] 01:55.5 CDE 9.05 EAUP 13.9 Fluid Pack Lot # [id number]   CESAREAN SECTION     times 3   DIALYSIS/PERMA CATHETER INSERTION Right 04/04/2017   Procedure: DIALYSIS/PERMA CATHETER INSERTION;  Surgeon: Katha Cabal, MD;  Location: [place] [facility];  Service: Cardiovascular;  Laterality: Right;   DIALYSIS/PERMA CATHETER REMOVAL N/A 09/17/2017   Procedure: DIALYSIS/PERMA CATHETER REMOVAL;  Surgeon: Algernon Huxley, MD;  Location: [place] [facility];  Service: Cardiovascular;  Laterality: N/A;   INCISION AND DRAINAGE Right 10/26/2020   Procedure: INCISION AND DRAINAGE;  Surgeon: [name], DPM;  Location: [facility];  Service: Podiatry;  Laterality: Right;   INSERTION OF DIALYSIS CATHETER N/A 04/16/2017   Procedure: INSERTION OF DIALYSIS PERM CATHETER;  Surgeon: Algernon Huxley, MD;  Location: [facility];  Service: Vascular;  Laterality: N/A;   LOWER EXTREMITY ANGIOGRAPHY Right 10/18/2020   Procedure: Lower Extremity Angiography;   Surgeon: Algernon Huxley, MD;  Location: [place] [facility];  Service: Cardiovascular;  Laterality: Right;   LOWER EXTREMITY ANGIOGRAPHY Right 04/17/2022   Procedure: Lower Extremity Angiography;  Surgeon: Algernon Huxley, MD;  Location: [place] [facility];  Service: Cardiovascular;  Laterality: Right;    Social History   Socioeconomic History   Marital status: Married    Spouse name: abjul   Number of children: Not on file   Years of education: 12   Highest education level: High school graduate  Occupational History   Occupation: Disabled  Tobacco Use   Smoking status: Never    Passive exposure: Past   Smokeless tobacco: Never  Vaping Use   Vaping Use: Never used  Substance and Sexual Activity   Alcohol  use: Not Currently    Comment: occasional   Drug use: Yes    Types: Marijuana   Sexual activity: Not Currently    Birth control/protection: None  Other Topics Concern   Not on file  Social History Narrative   Lives in [place] with her husband and 45 y/o child.  Three other children are grown and out of the house.  She is sedentary and does not routinely exercise.  On home O2.   Social Determinants of Health   Financial Resource Strain: High Risk (03/24/2017)   Overall Financial Resource Strain (CARDIA)    Difficulty of Paying Living Expenses: Very hard  Food Insecurity: No Food Insecurity (04/16/2022)   Hunger Vital Sign    Worried About Running Out of Food in the Last Year: Never true    Ran Out of Food in the Last Year: Never true  Transportation Needs: No Transportation Needs (04/16/2022)   PRAPARE - Hydrologist (Medical): No    Lack of Transportation (Non-Medical): No  Physical Activity: Inactive (03/24/2017)   Exercise Vital Sign    Days of Exercise per Week: 0 days    Minutes of Exercise per Session: 0 min  Stress: No Stress Concern Present (03/24/2017)   [place]     Feeling of Stress : Not at all  Social Connections: Somewhat Isolated (03/24/2017)   Social Connection and Isolation Panel [NHANES]    Frequency of Communication with Friends and Family: More than three times a week    Frequency of Social Gatherings with Friends and Family: Once a week    Attends Religious Services: Never    Marine scientist or Organizations: No    Attends Archivist Meetings: Never    Marital Status: Married  Human resources officer Violence: Not At Risk (04/16/2022)   Humiliation, Afraid, Rape, and Kick questionnaire    Fear of Current or Ex-Partner: No    Emotionally Abused: No    Physically Abused: No    Sexually Abused: No    Family History  Problem Relation Age of Onset   Diabetes Mother    Hypertension Mother    Hypertension Father    Diabetes Father    Hypertension Other    Diabetes Other    Breast cancer Maternal Aunt 28   Breast cancer Maternal Aunt 50   Allergies  Allergen Reactions   Dust Mite Extract Shortness Of Breath and Itching   Mold Extract [Trichophyton] Shortness Of Breath and Itching   Pollen Extract Shortness Of Breath    Itching, shortness of breath, asthma like symptoms   Ozempic (0.25 Or 0.5 Mg-Dose) [Semaglutide(0.25 Or 0.'5mg'$ -Dos)] Other (See Comments)    pancreatitis   Sulfa Antibiotics Itching   Misc. Sulfonamide Containing Compounds    Feraheme [Ferumoxytol] Itching    Uses benadryl so she can get the medicine   I? Current Facility-Administered Medications  Medication Dose Route Frequency Provider Last Rate Last Admin   (feeding supplement) PROSource Plus liquid 30 mL  30 mL Oral BID BM Algernon Huxley, MD   30 mL at 04/18/22 1424   acetaminophen (TYLENOL) tablet 650 mg  650 mg Oral Q6H PRN Algernon Huxley, MD   650 mg at 04/16/22 1703   Or   acetaminophen (TYLENOL) suppository 650 mg  650 mg Rectal Q6H PRN Algernon Huxley, MD       albuterol (PROVENTIL) (2.5 MG/3ML) 0.083% nebulizer solution 2.5 mg  2.5 mg Nebulization Q6H  PRN Algernon Huxley, MD       albuterol (PROVENTIL) (2.5 MG/3ML) 0.083% nebulizer solution 2.5 mg  2.5 mg Nebulization Q2H PRN Algernon Huxley, MD       arformoterol [facility]) nebulizer solution 15 mcg  15 mcg Nebulization Q12H Regalado, Belkys A, MD   15 mcg at 04/18/22 0755   aspirin EC tablet 81 mg  81 mg Oral Daily Algernon Huxley, MD   81 mg at 04/17/22 1501   atorvastatin (LIPITOR) tablet 40 mg  40 mg Oral Daily Algernon Huxley, MD   40 mg at 04/17/22 1055   budesonide (PULMICORT) nebulizer solution 0.5 mg  0.5 mg Nebulization BID Algernon Huxley, MD   0.5 mg at 04/18/22 0747   cefTRIAXone (ROCEPHIN) 2 g in sodium chloride 0.9 % 100 mL IVPB  2 g Intravenous Q24H Algernon Huxley, MD 200 mL/hr at 04/18/22 0003 2 g at 04/18/22 0003   Chlorhexidine Gluconate Cloth 2 % PADS 6 each  6 each Topical Q0600 Algernon Huxley, MD   6 each at 04/18/22 0546   diphenhydrAMINE (BENADRYL) capsule 50 mg  50 mg Oral Q8H PRN Algernon Huxley, MD   50 mg at 04/17/22 2057   heparin injection 5,000 Units  5,000 Units Subcutaneous Q8H Algernon Huxley, MD   5,000 Units at 04/18/22 1423   HYDROmorphone (DILAUDID) injection 0.5 mg  0.5 mg Intravenous Q4H PRN Regalado, Belkys A, MD   0.5 mg at 04/18/22 1424   insulin aspart (novoLOG) injection 0-6 Units  0-6 Units Subcutaneous Q4H Algernon Huxley, MD   1 Units at 04/18/22 1649   insulin glargine-yfgn (SEMGLEE) injection 15 Units  15 Units Subcutaneous Daily Algernon Huxley, MD   15 Units at 04/17/22 1000   ipratropium-albuterol (DUONEB) 0.5-2.5 (3) MG/3ML nebulizer solution 3 mL  3 mL Nebulization TID Regalado, Belkys A, MD   3 mL at 04/18/22 1441   linaclotide (LINZESS) capsule 290 mcg  290 mcg Oral q morning Regalado, Belkys A, MD   290 mcg at 04/18/22 1423   metroNIDAZOLE (FLAGYL) tablet 500 mg  500 mg Oral Q8H Algernon Huxley, MD   500 mg at 04/18/22 1423   midodrine (PROAMATINE) tablet 10 mg  10 mg Oral BID Algernon Huxley, MD   10 mg at 04/18/22 0824   montelukast (SINGULAIR) tablet 10 mg  10 mg Oral QHS  Regalado, Belkys A, MD   10 mg at 04/17/22 2057   multivitamin (RENA-VIT) tablet 1 tablet  1 tablet Oral QHS Algernon Huxley, MD   1 tablet at 04/17/22 2108   nutrition supplement (JUVEN) (JUVEN) powder packet 1 packet  1 packet Oral BID BM Algernon Huxley, MD   1 packet at 04/18/22 1423   ondansetron (ZOFRAN) tablet 4 mg  4 mg Oral Q6H PRN Algernon Huxley, MD       Or   ondansetron (ZOFRAN) injection 4 mg  4 mg Intravenous Q6H PRN Algernon Huxley, MD       ondansetron (ZOFRAN) injection 4 mg  4 mg Intravenous Q6H PRN Algernon Huxley, MD       oxyCODONE-acetaminophen (PERCOCET/ROXICET) 5-325 MG per tablet 1 tablet  1 tablet Oral Q4H PRN Algernon Huxley, MD   1 tablet at 04/18/22 1035   sevelamer carbonate (RENVELA) tablet 2,400 mg  2,400 mg Oral TID WC Algernon Huxley, MD   2,400 mg at 04/17/22 1723  Abtx:  Anti-infectives (From admission, onward)    Start     Dose/Rate Route Frequency Ordered Stop   04/17/22 1243  ceFAZolin (ANCEF) IVPB 1 g/50 mL premix  Status:  Discontinued        over 30 Minutes  Continuous PRN 04/17/22 1243 04/17/22 1346   04/17/22 1053  ceFAZolin (ANCEF) 2-4 GM/100ML-% IVPB       Note to Pharmacy: Corlis Hove H: cabinet override      04/17/22 1053 04/17/22 2259   04/16/22 2200  vancomycin (VANCOCIN) IVPB 1000 mg/200 mL premix        1,000 mg 200 mL/hr over 60 Minutes Intravenous Every T-Th-Sa (Hemodialysis) 04/16/22 0322 04/18/22 1319   04/16/22 1916  ceFAZolin (ANCEF) IVPB 2g/100 mL premix  Status:  Discontinued        2 g 200 mL/hr over 30 Minutes Intravenous 30 min pre-op 04/16/22 1916 04/17/22 1345   04/16/22 0130  vancomycin (VANCOREADY) IVPB 1250 mg/250 mL       See Hyperspace for full Linked Orders Report.   1,250 mg 166.7 mL/hr over 90 Minutes Intravenous  Once 04/15/22 2344 04/16/22 0706   04/16/22 0000  vancomycin (VANCOREADY) IVPB 1250 mg/250 mL       See Hyperspace for full Linked Orders Report.   1,250 mg 166.7 mL/hr over 90 Minutes Intravenous  Once 04/15/22  2344 04/16/22 0340   04/15/22 2345  cefTRIAXone (ROCEPHIN) 2 g in sodium chloride 0.9 % 100 mL IVPB        2 g 200 mL/hr over 30 Minutes Intravenous Every 24 hours 04/15/22 2339 04/22/22 2344   04/15/22 2345  metroNIDAZOLE (FLAGYL) tablet 500 mg        500 mg Oral Every 8 hours 04/15/22 2339 04/22/22 2159       REVIEW OF SYSTEMS:  Const: negative fever, negative chills, negative weight loss Eyes: negative diplopia or visual changes, negative eye pain ENT:  coryza, hoarseness throat Resp: + cough,no  hemoptysis, dyspnea Cards: negative for chest pain, palpitations, lower extremity edema GU: negative for frequency, dysuria and hematuria GI: Negative for abdominal pain, diarrhea, bleeding, constipation Skin: negative for rash and pruritus Heme: negative for easy bruising and gum/nose bleeding MS: negative for myalgias, arthralgias, back pain and muscle weakness Neurolo:negative for headaches, dizziness, vertigo, memory problems  Psych: negative for feelings of anxiety, depression  Endocrine: negative for thyroid, diabetes  Objective:  VITALS:  BP 103/60 (BP Location: Right Arm)   Pulse 90   Temp 99.9 F (37.7 C)   Resp 20   Ht '5\' 4"'$  (1.626 m)   Wt 124.1 kg   LMP 10/14/2012 (Approximate)   SpO2 97%   BMI 46.96 kg/m   PHYSICAL EXAM:  General: Alert, cooperative, no distress, appears stated age.  Head: Normocephalic, without obvious abnormality, atraumatic. Eyes: Conjunctivae clear, anicteric sclerae. Pupils are equal ENT Nares normal. No drainage or sinus tenderness. Lips, mucosa, and tongue normal. No Thrush Neck: Supple, symmetrical, no adenopathy, thyroid: non tender no carotid bruit and no JVD. Back: No CVA tenderness. Lungs: Clear to auscultation bilaterally. No Wheezing or Rhonchi. No rales. Heart: Regular rate and rhythm, no murmur, rub or gallop. Abdomen: Soft, non-tender,not distended. Bowel sounds normal. No masses Extremities: Left arm AVG rt foot  Chronic  wound on the plantar surface- the dead skin has been debrided exposing reddish tissue   Skin: chronic,  hyperpigmented papules over back, arms Lymph: Cervical, supraclavicular normal. Neurologic: Grossly non-focal Pertinent Labs Lab Results CBC  Component Value Date/Time   WBC 12.1 (H) 04/18/2022 0914   RBC 3.86 (L) 04/18/2022 0914   HGB 11.3 (L) 04/18/2022 0914   HGB 12.4 01/11/2014 1324   HCT 36.4 04/18/2022 0914   HCT 39.2 01/11/2014 1324   PLT 260 04/18/2022 0914   PLT 370 01/11/2014 1324   MCV 94.3 04/18/2022 0914   MCV 86 01/11/2014 1324   MCH 29.3 04/18/2022 0914   MCHC 31.0 04/18/2022 0914   RDW 13.8 04/18/2022 0914   RDW 15.1 (H) 01/11/2014 1324   LYMPHSABS 0.9 04/17/2022 0918   LYMPHSABS 1.8 01/11/2014 1324   MONOABS 0.6 04/17/2022 0918   MONOABS 0.5 01/11/2014 1324   EOSABS 0.1 04/17/2022 0918   EOSABS 0.2 01/11/2014 1324   BASOSABS 0.0 04/17/2022 0918   BASOSABS 0.1 01/11/2014 1324       Latest Ref Rng & Units 04/18/2022    9:14 AM 04/17/2022    9:18 AM 04/16/2022    3:01 PM  CMP  Glucose 70 - 99 mg/dL 132  152  236   BUN 6 - 20 mg/dL 62  48  76   Creatinine 0.44 - 1.00 mg/dL 8.82  7.26  9.90   Sodium 135 - 145 mmol/L 130  131  130   Potassium 3.5 - 5.1 mmol/L 4.1  3.7  3.9   Chloride 98 - 111 mmol/L 94  93  91   CO2 22 - 32 mmol/L '19  23  19   '$ Calcium 8.9 - 10.3 mg/dL 8.6  9.0  8.7   Total Protein 6.5 - 8.1 g/dL  7.1    Total Bilirubin 0.3 - 1.2 mg/dL  0.7    Alkaline Phos 38 - 126 U/L  353    AST 15 - 41 U/L  18    ALT 0 - 44 U/L  15        Microbiology: Recent Results (from the past 240 hour(s))  Culture, blood (Routine x 2)     Status: None (Preliminary result)   Collection Time: 04/15/22  4:54 PM   Specimen: BLOOD  Result Value Ref Range Status   Specimen Description BLOOD LEFT ANTECUBITAL  Final   Special Requests   Final    BOTTLES DRAWN AEROBIC AND ANAEROBIC Blood Culture adequate volume   Culture   Final    NO GROWTH 3  DAYS Performed at [facility], [address] [place], [place] [address]    Report Status PENDING  Incomplete  Culture, blood (Routine x 2)     Status: None (Preliminary result)   Collection Time: 04/15/22  7:24 PM   Specimen: BLOOD  Result Value Ref Range Status   Specimen Description BLOOD RIGHT ANTECUBITAL  Final   Special Requests   Final    BOTTLES DRAWN AEROBIC AND ANAEROBIC Blood Culture adequate volume   Culture   Final    NO GROWTH 3 DAYS Performed at [facility], [address], [place], [place] [address]    Report Status PENDING  Incomplete  Resp panel by RT-PCR (RSV, Flu A&B, Covid) Anterior Nasal Swab     Status: None   Collection Time: 04/15/22  7:25 PM   Specimen: Anterior Nasal Swab  Result Value Ref Range Status   SARS Coronavirus 2 by RT PCR NEGATIVE NEGATIVE Final    Comment: (NOTE) SARS-CoV-2 target nucleic acids are NOT DETECTED.  The SARS-CoV-2 RNA is generally detectable in upper respiratory specimens during the acute phase of infection. The lowest concentration  of SARS-CoV-2 viral copies this assay can detect is 138 copies/mL. A negative result does not preclude SARS-Cov-2 infection and should not be used as the sole basis for treatment or other patient management decisions. A negative result may occur with  improper specimen collection/handling, submission of specimen other than nasopharyngeal swab, presence of viral mutation(s) within the areas targeted by this assay, and inadequate number of viral copies(<138 copies/mL). A negative result must be combined with clinical observations, patient history, and epidemiological information. The expected result is Negative.  Fact Sheet for Patients:  EntrepreneurPulse.com.au  Fact Sheet for Healthcare Providers:  IncredibleEmployment.be  This test is no t yet approved or cleared by the [place] FDA and  has been authorized for detection and/or  diagnosis of SARS-CoV-2 by FDA under an Emergency Use Authorization (EUA). This EUA will remain  in effect (meaning this test can be used) for the duration of the COVID-19 declaration under Section 564(b)(1) of the Act, 21 U.S.C.section 360bbb-3(b)(1), unless the authorization is terminated  or revoked sooner.       Influenza A by PCR NEGATIVE NEGATIVE Final   Influenza B by PCR NEGATIVE NEGATIVE Final    Comment: (NOTE) The Xpert Xpress SARS-CoV-2/FLU/RSV plus assay is intended as an aid in the diagnosis of influenza from Nasopharyngeal swab specimens and should not be used as a sole basis for treatment. Nasal washings and aspirates are unacceptable for Xpert Xpress SARS-CoV-2/FLU/RSV testing.  Fact Sheet for Patients: EntrepreneurPulse.com.au  Fact Sheet for Healthcare Providers: IncredibleEmployment.be  This test is not yet approved or cleared by the [place] FDA and has been authorized for detection and/or diagnosis of SARS-CoV-2 by FDA under an Emergency Use Authorization (EUA). This EUA will remain in effect (meaning this test can be used) for the duration of the COVID-19 declaration under Section 564(b)(1) of the Act, 21 U.S.C. section 360bbb-3(b)(1), unless the authorization is terminated or revoked.     Resp Syncytial Virus by PCR NEGATIVE NEGATIVE Final    Comment: (NOTE) Fact Sheet for Patients: EntrepreneurPulse.com.au  Fact Sheet for Healthcare Providers: IncredibleEmployment.be  This test is not yet approved or cleared by the [place] FDA and has been authorized for detection and/or diagnosis of SARS-CoV-2 by FDA under an Emergency Use Authorization (EUA). This EUA will remain in effect (meaning this test can be used) for the duration of the COVID-19 declaration under Section 564(b)(1) of the Act, 21 U.S.C. section 360bbb-3(b)(1), unless the authorization is terminated  or revoked.  Performed at [facility], [address] [place], [place] [address]     IMAGING RESULTS: MRI reviewed Chronic osteo of 1-4 met head I have personally reviewed the films ? Impression/Recommendation ?Chronic rt foot wound with dry gangrene - been there since 2022- amputation has been declined many times and the wound seems to be stable . There is no evidence of systemic infection- recent culture as OP from wound clinic was staph lugdunensis, will repeat culture today. The dead skin over the plantar surface has ben debrided exposing underlying tissue/ tendon. Has chronic osteomyelitis- last Iv antibiotic course of 6 weeks was in feb/2023 Will give her vanco and ceftazidime during dialysis for 4-6 weeks? ?pt is fully cognizant of the foot condition- She is trying as much as possible to avoid amputation . She also knows that if she becomes very septic then amputation will become inevitable  Angio done this admission no vascular compromise Anemia  DM   ESRD on dialysis   Asthma /obesity hypoventilation syndrome  On oxygen supplementation   ___________________________________________________ Discussed with patient and her husband  in detail

## 2022-04-18 NOTE — Progress Notes (Signed)
1430 Dressing to R foot changed. Bottom of right foot cleaned with CHG soap, patted dry, xeroform, betadine and gauze applied. R Foot wrapped in kerlex Pt tolerated.

## 2022-04-18 NOTE — Progress Notes (Signed)
  Received patient in bed to unit.   Informed consent signed and in chart.    TX duration:3.5hrs     Transported to floor  Hand-off given to patient's nurse.  C/o 8/10 pain generalized.   Access used: LAVF Access issues: venous cannulated x2    Total UF removed: 1.5kgs Medication(s) given: vancomycin 1036m and  oxycodone 5/3287mPost HD VS: 112/66 HR 101 Post HD weight124.1kg     [facility]

## 2022-04-18 NOTE — Progress Notes (Signed)
[facility]  ROUNDING NOTE   Subjective:   [name] is a 52 year old African-American female with a past medical history of end-stage renal disease, patient is on Tuesday Thursday Saturday schedule, diabetes mellitus, history of pancreatitis who came to the ER with chief complaint of foot pain. She has been admitted for Osteomyelitis of right foot ([place]) [M86.9] Acute hematogenous osteomyelitis of right foot [facility]) [M86.071]  Patient is known to our clinic and receives outpatient dialysis treatments at [facility] on a TTS schedule, supervised by Dr Candiss Norse.   Patient seen and evaluated during dialysis   HEMODIALYSIS FLOWSHEET:  Blood Flow Rate (mL/min): 400 mL/min Arterial Pressure (mmHg): -200 mmHg Venous Pressure (mmHg): 230 mmHg TMP (mmHg): 7 mmHg Ultrafiltration Rate (mL/min): 706 mL/min Dialysate Flow Rate (mL/min): 300 ml/min  Tolerating treatment well HD PN reporting increased line pressures with decreased BFR.   Objective:  Vital signs in last 24 hours:  Temp:  [97.6 F (36.4 C)-99.3 F (37.4 C)] 97.6 F (36.4 C) (02/22 0915) Pulse Rate:  [0-106] 102 (02/22 1200) Resp:  [14-26] 19 (02/22 1200) BP: (92-153)/(33-94) 108/67 (02/22 1200) SpO2:  [89 %-100 %] 98 % (02/22 1200) Weight:  [123.8 kg] 123.8 kg (02/22 0936)  Weight change: -2.1 kg Filed Weights   04/16/22 1931 04/17/22 1142 04/18/22 0936  Weight: 122.6 kg 122 kg 123.8 kg    Intake/Output: I/O last 3 completed shifts: In: 400 [IV Piggyback:400] Out: 1800 [Other:1800]   Intake/Output this shift:  No intake/output data recorded.  Physical Exam: General: NAD  Head: Normocephalic, atraumatic. Moist oral mucosal membranes  Eyes: Anicteric  Lungs:  Clear to auscultation, normal effort  Heart: Regular rate and rhythm  Abdomen:  Soft, nontender  Extremities:  trace peripheral edema. Right foot ace bandages  Neurologic: Alert and oriented, moving all four extremities  Skin: No  lesions  Access: Lt AVF    Basic Metabolic Panel: Recent Labs  Lab 04/15/22 1654 04/16/22 1501 04/17/22 0918 04/18/22 0914  NA 132* 130* 131* 130*  K 3.8 3.9 3.7 4.1  CL 95* 91* 93* 94*  CO2 19* 19* 23 19*  GLUCOSE 240* 236* 152* 132*  BUN 65* 76* 48* 62*  CREATININE 8.83* 9.90* 7.26* 8.82*  CALCIUM 8.7* 8.7* 9.0 8.6*  PHOS  --  9.2*  --  8.5*     Liver Function Tests: Recent Labs  Lab 04/15/22 1654 04/16/22 1501 04/17/22 0918 04/18/22 0914  AST 21  --  18  --   ALT 22  --  15  --   ALKPHOS 417*  --  353*  --   BILITOT 0.9  --  0.7  --   PROT 7.4  --  7.1  --   ALBUMIN 3.8 3.6 3.7 3.6    No results for input(s): "LIPASE", "AMYLASE" in the last 168 hours. No results for input(s): "AMMONIA" in the last 168 hours.  CBC: Recent Labs  Lab 04/15/22 1654 04/16/22 1501 04/17/22 0918 04/18/22 0914  WBC 8.7 8.5 7.6 12.1*  NEUTROABS 6.9  --  5.9  --   HGB 11.9* 10.8* 11.4* 11.3*  HCT 39.5 34.8* 36.3 36.4  MCV 97.1 95.9 94.3 94.3  PLT 291 268 267 260     Cardiac Enzymes: No results for input(s): "CKTOTAL", "CKMB", "CKMBINDEX", "TROPONINI" in the last 168 hours.  BNP: Invalid input(s): "POCBNP"  CBG: Recent Labs  Lab 04/17/22 1526 04/17/22 2001 04/17/22 2359 04/18/22 0342 04/18/22 0823  GLUCAP 139* 225* 254* 141* 114*  Microbiology: Results for orders placed or performed during the hospital encounter of 04/15/22  Culture, blood (Routine x 2)     Status: None (Preliminary result)   Collection Time: 04/15/22  4:54 PM   Specimen: BLOOD  Result Value Ref Range Status   Specimen Description BLOOD LEFT ANTECUBITAL  Final   Special Requests   Final    BOTTLES DRAWN AEROBIC AND ANAEROBIC Blood Culture adequate volume   Culture   Final    NO GROWTH 3 DAYS Performed at [facility], [address], [place], [place] [address]    Report Status PENDING  Incomplete  Culture, blood (Routine x 2)     Status: None (Preliminary result)    Collection Time: 04/15/22  7:24 PM   Specimen: BLOOD  Result Value Ref Range Status   Specimen Description BLOOD RIGHT ANTECUBITAL  Final   Special Requests   Final    BOTTLES DRAWN AEROBIC AND ANAEROBIC Blood Culture adequate volume   Culture   Final    NO GROWTH 3 DAYS Performed at [facility], [address] [place], [place] [address]    Report Status PENDING  Incomplete  Resp panel by RT-PCR (RSV, Flu A&B, Covid) Anterior Nasal Swab     Status: None   Collection Time: 04/15/22  7:25 PM   Specimen: Anterior Nasal Swab  Result Value Ref Range Status   SARS Coronavirus 2 by RT PCR NEGATIVE NEGATIVE Final    Comment: (NOTE) SARS-CoV-2 target nucleic acids are NOT DETECTED.  The SARS-CoV-2 RNA is generally detectable in upper respiratory specimens during the acute phase of infection. The lowest concentration of SARS-CoV-2 viral copies this assay can detect is 138 copies/mL. A negative result does not preclude SARS-Cov-2 infection and should not be used as the sole basis for treatment or other patient management decisions. A negative result may occur with  improper specimen collection/handling, submission of specimen other than nasopharyngeal swab, presence of viral mutation(s) within the areas targeted by this assay, and inadequate number of viral copies(<138 copies/mL). A negative result must be combined with clinical observations, patient history, and epidemiological information. The expected result is Negative.  Fact Sheet for Patients:  EntrepreneurPulse.com.au  Fact Sheet for Healthcare Providers:  IncredibleEmployment.be  This test is no t yet approved or cleared by the [place] FDA and  has been authorized for detection and/or diagnosis of SARS-CoV-2 by FDA under an Emergency Use Authorization (EUA). This EUA will remain  in effect (meaning this test can be used) for the duration of the COVID-19 declaration under  Section 564(b)(1) of the Act, 21 U.S.C.section 360bbb-3(b)(1), unless the authorization is terminated  or revoked sooner.       Influenza A by PCR NEGATIVE NEGATIVE Final   Influenza B by PCR NEGATIVE NEGATIVE Final    Comment: (NOTE) The Xpert Xpress SARS-CoV-2/FLU/RSV plus assay is intended as an aid in the diagnosis of influenza from Nasopharyngeal swab specimens and should not be used as a sole basis for treatment. Nasal washings and aspirates are unacceptable for Xpert Xpress SARS-CoV-2/FLU/RSV testing.  Fact Sheet for Patients: EntrepreneurPulse.com.au  Fact Sheet for Healthcare Providers: IncredibleEmployment.be  This test is not yet approved or cleared by the [place] FDA and has been authorized for detection and/or diagnosis of SARS-CoV-2 by FDA under an Emergency Use Authorization (EUA). This EUA will remain in effect (meaning this test can be used) for the duration of the COVID-19 declaration under Section 564(b)(1) of the Act, 21 U.S.C. section  360bbb-3(b)(1), unless the authorization is terminated or revoked.     Resp Syncytial Virus by PCR NEGATIVE NEGATIVE Final    Comment: (NOTE) Fact Sheet for Patients: EntrepreneurPulse.com.au  Fact Sheet for Healthcare Providers: IncredibleEmployment.be  This test is not yet approved or cleared by the [place] FDA and has been authorized for detection and/or diagnosis of SARS-CoV-2 by FDA under an Emergency Use Authorization (EUA). This EUA will remain in effect (meaning this test can be used) for the duration of the COVID-19 declaration under Section 564(b)(1) of the Act, 21 U.S.C. section 360bbb-3(b)(1), unless the authorization is terminated or revoked.  Performed at [facility], [address] [place], [place] [address]     Coagulation Studies: Recent Labs    04/15/22 1654  LABPROT 15.1  INR 1.2      Urinalysis: No results for input(s): "COLORURINE", "LABSPEC", "PHURINE", "GLUCOSEU", "HGBUR", "BILIRUBINUR", "KETONESUR", "PROTEINUR", "UROBILINOGEN", "NITRITE", "LEUKOCYTESUR" in the last 72 hours.  Invalid input(s): "APPERANCEUR"    Imaging: PERIPHERAL VASCULAR CATHETERIZATION  Result Date: 04/17/2022 See surgical note for result.  MR FOOT RIGHT WO CONTRAST  Result Date: 04/17/2022 CLINICAL DATA:  Chronic nonhealing right foot ulcers. History of prior amputation. EXAM: MRI OF THE RIGHT FOREFOOT WITHOUT CONTRAST TECHNIQUE: Multiplanar, multisequence MR imaging of the right forefoot was performed. No intravenous contrast was administered. COMPARISON:  Right foot x-rays from yesterday. MRI right foot dated April 24, 2021. FINDINGS: Bones/Joint/Cartilage Bony destruction of the first through fourth metatarsal heads, bases of the first through fourth proximal phalanges, and head of the first proximal phalanx. There is mild marrow edema and prominent decreased T1 marrow signal associated with the bony destruction. Prior fifth ray amputation no fracture or dislocation. Joint spaces are preserved. No joint effusion. Ligaments Lisfranc ligament is intact. Muscles and Tendons No tenosynovitis. Increased T2 signal within the intrinsic muscles of the forefoot, nonspecific, but likely related to diabetic muscle changes. Soft tissue Large plantar and lateral forefoot soft tissue ulceration extending near bone. Mild dorsal foot soft tissue swelling. No fluid collection. No soft tissue mass. IMPRESSION: 1. Large plantar and lateral forefoot soft tissue ulceration with underlying subacute to chronic osteomyelitis of the first through fourth metatarsal heads, bases of the first through fourth proximal phalanges, and head of the first proximal phalanx. No abscess. Electronically Signed   By: Titus Dubin M.D.   On: 04/17/2022 07:47     Medications:    cefTRIAXone (ROCEPHIN)  IV 2 g (04/18/22 0003)    vancomycin Stopped (04/16/22 1903)    (feeding supplement) PROSource Plus  30 mL Oral BID BM   arformoterol  15 mcg Nebulization Q12H   aspirin EC  81 mg Oral Daily   atorvastatin  40 mg Oral Daily   budesonide  0.5 mg Nebulization BID   Chlorhexidine Gluconate Cloth  6 each Topical Q0600   heparin  5,000 Units Subcutaneous Q8H   insulin aspart  0-6 Units Subcutaneous Q4H   insulin glargine-yfgn  15 Units Subcutaneous Daily   ipratropium-albuterol  3 mL Nebulization TID   linaclotide  290 mcg Oral q morning   metroNIDAZOLE  500 mg Oral Q8H   midodrine  10 mg Oral BID   montelukast  10 mg Oral QHS   multivitamin  1 tablet Oral QHS   nutrition supplement (JUVEN)  1 packet Oral BID BM   sevelamer carbonate  2,400 mg Oral TID WC   acetaminophen **OR** acetaminophen, albuterol, albuterol, diphenhydrAMINE, fentaNYL (SUBLIMAZE) injection, fentaNYL (SUBLIMAZE) injection, ondansetron **OR** ondansetron (  ZOFRAN) IV, ondansetron (ZOFRAN) IV, oxyCODONE-acetaminophen  Assessment/ Plan:  [name] is a 52 y.o.  female with a past medical history of end-stage renal disease, patient is on Tuesday Thursday Saturday schedule, diabetes mellitus, history of pancreatitis who came to the ER with chief complaint of foot pain. She has been admitted for Osteomyelitis of right foot ([place]) [M86.9] Acute hematogenous osteomyelitis of right foot ([place]) [M86.071]   [facility] TTS Left AVF 127.5kg   End stage renal disease on hemodialysis. Receiving dialysis today, UF 1.5L as tolerated. Next treatment scheduled for Saturday.   2. Anemia of chronic kidney disease Lab Results  Component Value Date   HGB 11.3 (L) 04/18/2022    Hemoglobin at goal. Patient receives [place] outpatient.   3. Secondary Hyperparathyroidism: with outpatient labs: PTH 1243, phosphorus 11.9, calcium 8.9 on 07/03/21.   Lab Results  Component Value Date   PTH 134 (H) 04/21/2017   CALCIUM 8.6 (L) 04/18/2022   CAION 0.81  (LL) 10/26/2020   PHOS 8.5 (H) 04/18/2022    Will continue to monitor bone minerals during this admission. Continue sevelamer 2400 mg with meals.  4. Diabetes mellitus type II with chronic kidney disease/renal manifestations: noninsulin dependent. Most recent hemoglobin A1c is 7.3 on 04/16/22.   Glucose elevated at times, primary team to manage SSI  5. Osteomyelitis, right foot.  Appreciate vascular and podiatry input.  Angiogram completed on 04/17/22 with adequate blood flow to extremity.  Podiatry continues to recommend proximal midfoot amputation, patient refusing and would like to continue wound care and antibiotics.    LOS: [place] 2/22/202412:11 PM

## 2022-04-18 NOTE — Plan of Care (Signed)

## 2022-04-18 NOTE — Progress Notes (Signed)
New needle place to replace venous needle not working.  Bfr now at 400bfr  MD and NP aware of venous not working properly.

## 2022-04-18 NOTE — Progress Notes (Signed)
PT Cancellation Note  Patient Details Name: [name] MRN: [id number] DOB: Apr 25, 1970   Cancelled Treatment:    Reason Eval/Treat Not Completed: Other (comment) Pt out of room for dialysis most of the AM.  Attempted to see X 2 this afternoon.  Initially she was eating and pleasantly refused, on later return she was laying in bed reporting feeling very tired/worn out and states "I can't get up today." And states that she will have more energy tomorrow if she can get some rest.  Kreg Shropshire, DPT 04/18/2022, 5:05 PM

## 2022-04-18 NOTE — Consult Note (Signed)
  Subjective:  Patient ID: [name], female    DOB: [birth year],  MRN: [id number]  A 52 y.o. female presents with right forefoot osteomyelitis with history of gangrene well-demarcated.  Patient states she is doing well no acute pain no complaints.  She had a vascular procedure done and has got good blood flow to the right lower extremity  Objective:   Vitals:   04/17/22 2350 04/17/22 2353  BP: (!) 94/38 (!) 94/38  Pulse: 90 90  Resp: 20 20  Temp: 98.5 F (36.9 C) 98.5 F (36.9 C)  SpO2: 93% 93%   General AA&O x3. Normal mood and affect.  Vascular Dorsalis pedis and posterior tibial pulses 2/4 bilat. Brisk capillary refill to all digits. Pedal hair present.  Neurologic Epicritic sensation grossly intact.  Dermatologic Nonhealing ulceration to the right forefoot with soft tissue loss to the plantar forefoot to hindfoot.  Well-demarcated.  No purulent drainage expressed.  Mild redness noted.  Orthopedic: MMT 5/5 in dorsiflexion, plantarflexion, inversion, and eversion. Normal joint ROM without pain or crepitus.  The knee  Radiographic exam RT foot 04/15/2022: IMPRESSION: 1. Previous fifth transmetatarsal amputation. 2. Interval progression of bony destructive change involving the distal second third and fourth metatarsals as well as the second third and fourth proximal phalanges consistent with osteomyelitis. 3. Widening of the first MTP joint with erosive changes at the base of first proximal phalanx and head of first metatarsal consistent with septic joint and osteomyelitis. New erosive changes at the head of the first proximal phalanx also consistent with osteomyelitis.  Assessment & Plan:  Patient was evaluated and treated and all questions answered.  Right forefoot extensive soft tissue loss with history of gangrene in the setting of recent revascularization -All questions and concerns were discussed with the patient in extensive detail -Given the amount of soft tissue  loss that is present patient will likely need a proximal midfoot amputation versus [place] amputation to allow for soft tissue closure with possible grafting.  I discussed this with patient and she does not want any type of amputation at this time. -She would like to focus primarily on local wound care and IV antibiotics -Betadine wet-to-dry dressing -Partial weightbearing to the heel for transfers only -Patient can return to wound care center for further follow-up -Podiatry to sign off please reconsult [place] if she changes her mind about amputation  [name], DPM  Accessible via secure chat for questions or concerns.

## 2022-04-18 NOTE — Progress Notes (Signed)
[name] blood pressures were at [id number] systolic all night. Patient has Midodrine due at 10am. Dr. Juleen China agreed on giving it early before coming down to HD unit. Floor RN notified.

## 2022-04-18 NOTE — Progress Notes (Signed)
OT Cancellation Note  Patient Details Name: [name] MRN: [id number] DOB: [birth year]   Cancelled Treatment:    Reason Eval/Treat Not Completed: Patient at procedure or test/ unavailable. OT continues to follow pt for therapy services. Awaiting clarification re: WB status per podiatry/vascular. Pt currently  OTF for HD this am. Will hold and re-attempt at a later date/time as available and pt medically appropriate for OT services.   Shara Blazing, M.S., OTR/L 04/18/22, 10:30 AM

## 2022-04-18 NOTE — Plan of Care (Signed)
  Problem: Activity: Goal: Risk for activity intolerance will decrease Outcome: Progressing   Problem: Safety: Goal: Ability to remain free from injury will improve Outcome: Progressing   

## 2022-04-18 NOTE — Progress Notes (Signed)
Progress Note    04/18/2022 4:52 PM 1 Day Post-Op  Subjective: [name]. [name] is a 52 year old female who presents to [facility] with right forefoot osteomyelitis with history of gangrene well-demarcated.  She is now postop day 1 from a right lower extremity angiogram.  The results states she has good blood flow to her right lower extremity.  She denies any acute pain.  She denies any chest pain or shortness of breath.  She has no complaints overnight.  Vitals all remained stable.   Vitals:   04/18/22 1326 04/18/22 1601  BP: 112/66 103/60  Pulse: (!) 101 90  Resp: (!) 25 20  Temp: 97.9 F (36.6 C) 99.9 F (37.7 C)  SpO2: 96% 97%   Physical Exam: Cardiac:  RRR without murmurs rubs or gallops without carotid bruits. Lungs: Normal nonlabored breathing, without rales rhonchi or wheezing. Incisions: Right groin with dressing clean dry and intact no hematoma seroma or infection noted Extremities: Right lower extremity with ischemic changes and gangrene and questionable cellulitis with open wounds to the forefoot. Abdomen: Positive bowel sounds in all 4 quadrants, soft, nontender, nondistended. Neurologic: AAOX3 neuro intact.  Patient answers all questions appropriately.  CBC    Component Value Date/Time   WBC 12.1 (H) 04/18/2022 0914   RBC 3.86 (L) 04/18/2022 0914   HGB 11.3 (L) 04/18/2022 0914   HGB 12.4 01/11/2014 1324   HCT 36.4 04/18/2022 0914   HCT 39.2 01/11/2014 1324   PLT 260 04/18/2022 0914   PLT 370 01/11/2014 1324   MCV 94.3 04/18/2022 0914   MCV 86 01/11/2014 1324   MCH 29.3 04/18/2022 0914   MCHC 31.0 04/18/2022 0914   RDW 13.8 04/18/2022 0914   RDW 15.1 (H) 01/11/2014 1324   LYMPHSABS 0.9 04/17/2022 0918   LYMPHSABS 1.8 01/11/2014 1324   MONOABS 0.6 04/17/2022 0918   MONOABS 0.5 01/11/2014 1324   EOSABS 0.1 04/17/2022 0918   EOSABS 0.2 01/11/2014 1324   BASOSABS 0.0 04/17/2022 0918   BASOSABS 0.1 01/11/2014 1324    BMET    Component Value Date/Time   NA  130 (L) 04/18/2022 0914   NA 135 (L) 11/21/2013 1153   K 4.1 04/18/2022 0914   K 4.1 11/21/2013 1153   CL 94 (L) 04/18/2022 0914   CL 98 11/21/2013 1153   CO2 19 (L) 04/18/2022 0914   CO2 30 11/21/2013 1153   GLUCOSE 132 (H) 04/18/2022 0914   GLUCOSE 287 (H) 11/21/2013 1153   BUN 62 (H) 04/18/2022 0914   BUN 16 11/21/2013 1153   CREATININE 8.82 (H) 04/18/2022 0914   CREATININE 0.85 11/21/2013 1153   CALCIUM 8.6 (L) 04/18/2022 0914   CALCIUM 8.7 11/21/2013 1153   GFRNONAA 5 (L) 04/18/2022 0914   GFRNONAA >60 11/21/2013 1153   GFRAA 15 (L) 04/26/2017 0609   GFRAA >60 11/21/2013 1153    INR    Component Value Date/Time   INR 1.2 04/15/2022 1654     Intake/Output Summary (Last 24 hours) at 04/18/2022 1652 Last data filed at 04/18/2022 1530 Gross per 24 hour  Intake 450 ml  Output 1500 ml  Net -1050 ml   Radiographic exam RT foot 04/15/2022: IMPRESSION: 1. Previous fifth transmetatarsal amputation. 2. Interval progression of bony destructive change involving the distal second third and fourth metatarsals as well as the second third and fourth proximal phalanges consistent with osteomyelitis. 3. Widening of the first MTP joint with erosive changes at the base of first proximal phalanx and head of first  metatarsal consistent with septic joint and osteomyelitis. New erosive changes at the head of the first proximal phalanx also consistent with osteomyelitis.   Assessment/Plan:  52 y.o. female is s/p right lower extremity angiogram.  1 Day Post-Op   PLAN: At this time vascular surgery can only offer a below the knee amputation for her diagnosed septic joint with osteomyelitis and gangrene.  Patient is refusing any amputation at this point in time.  Would like to focus primarily on local wound care and IV antibiotics.  We agree with podiatry requesting Betadine wet-to-dry dressings.  Partial weightbearing to the heel for transfers only.  Patient can return to the wound care  center for further follow-up. [place] surgery to sign off please reconsult [place] if she changes her mind about a below the knee amputation.  Plan was discussed with Dr. [name] MD and he agrees with the plan   Drema Pry [facility] 04/18/2022 4:52 PM

## 2022-04-18 NOTE — Care Management Important Message (Signed)
Important Message  Patient Details  Name: [name] MRN: [id number] Date of Birth: [birth year]   Medicare Important Message Given:  Yes     Juliann Pulse A Adom Schoeneck 04/18/2022, 11:28 AM

## 2022-04-18 NOTE — Progress Notes (Signed)
PROGRESS NOTE    [name]  [id number] DOB: [birth year] DOA: 04/15/2022 PCP: Evern Bio, NP   Brief Narrative: 52 year old with past medical history significant for hypertension, COPD on 3 to 4 L of oxygen, ESRD on hemodialysis TTS, diabetes type 2, OSA, prior osteomyelitis right foot status post recent weight amputation with and with chronic nonhealing ulcer, followed at wound care center, last debrided on 2/12, sent to the ED with concern for gangrene and osteomyelitis of the right foot.  Nephrology and podiatry and vascular surgery consulted.  Vascular perform angiogram 2/21, right lower extremity demonstrates significant calcification of her vessels.  There is no significant stenosis in the right common femoral artery, profunda femoris artery, superficial femoral artery or popliteal artery.  The anterior tibial artery was large and patent.  The posterior tibial artery provided a second runoff vessel  to the foot, it was a smaller but not stenotic until  there was intrinsic diseases in the mid to distal foot portion of the posterior tibial artery.  The peroneal artery was small and did not appear to continue on the way to the ankle.   Assessment & Plan:   Principal Problem:   Osteomyelitis of right foot (HCC) Active Problems:   High anion gap metabolic acidosis   ESRD on hemodialysis ([place])   Uncontrolled type 2 diabetes mellitus with hyperglycemia, with long-term current use of insulin (HCC)   Hypertension   COPD exacerbation (HCC)   Chronic diastolic heart failure (HCC)   Obesity, Class III, BMI 40-49.9 (morbid obesity) ([place])   Obesity hypoventilation syndrome (HCC)   Chronic respiratory failure with hypoxia and hypercapnia (HCC)   PVD (peripheral vascular disease) (HCC)  1-Osteomyelitis of the right foot:  Peripheral vascular disease s/p prior history of with amputation of the fifth ray  right foot amputation  -2/21 MRI: Large plantar and lateral forefoot soft tissue  ulceration with underlying subacute to chronic osteomyelitis of the first through fourth metatarsal heads, bases of the first through fourth proximal phalanges, and head of the first proximal phalanx. No abscess. -No septic joint on MRI -2/21 S/P Arteriogram: Showed posterior tibial artery provided a second runoff vessel  to the foot, it was a smaller but not stenotic until  there was intrinsic diseases in the mid to distal foot portion of the posterior tibial artery. -Podiatry recommended Midfoot amputation, vs Chopart amputation.  , patient has decline amputation.  -Continue with IV vancomycin and ceftriaxone -ID consult for antibiotics recommendations.   2-ESRD on hemodialysis -Metabolic acidosis, Hyponatremia.  -Management with hemodialysis.  -Patient had hemodialysis 2/20//  2/22  3-uncontrolled type 2 diabetes with hyperglycemia, long-term use of insulin Continue with a sliding scale and basal insulin  Chronic diastolic heart failure: euvolemic  COPD exacerbation Obesity hypoventilation syndrome Respiratory failure with hypoxia, on 3-4 L oxygen at baseline.  Mild exacerbation With DuoNeb, Pulmicort, resume Brovana Continue as needed nebulizer and is  Hypertension : Hold metoprolol  Has hypotension on midodrine.   PVD; Vascular consulted and following.   Morbid obesity class III BMI 45 Needs lifestyle modification    Nutrition Problem: Increased nutrient needs Etiology: wound healing    Signs/Symptoms: estimated needs    Interventions: MVI, Prostat, Juven  Estimated body mass index is 46.96 kg/m as calculated from the following:   Height as of this encounter: 5' 4"$  (1.626 m).   Weight as of this encounter: 124.1 kg.   DVT prophylaxis: Heparin Code Status: Full code Family Communication: Care discussed with patient Disposition  Plan:  Status is: Inpatient Remains inpatient appropriate because: management of foot infection.     Consultants:   Vascular Nephrology  Podiatry   Procedures:  Arteriogram.   Antimicrobials:    Subjective: She decline sx.. her wound looks better.  She relates fentanyl doesn't help with pain.    Objective: Vitals:   04/18/22 1230 04/18/22 1300 04/18/22 1326 04/18/22 1344  BP: 102/73 115/72 112/66   Pulse: (!) 101 (!) 102 (!) 101   Resp: (!) 21 (!) 21 (!) 25   Temp:   97.9 F (36.6 C)   TempSrc:   Oral   SpO2: 96% 97% 96%   Weight:    124.1 kg  Height:        Intake/Output Summary (Last 24 hours) at 04/18/2022 1416 Last data filed at 04/18/2022 1326 Gross per 24 hour  Intake 100 ml  Output 1500 ml  Net -1400 ml    Filed Weights   04/17/22 1142 04/18/22 0936 04/18/22 1344  Weight: 122 kg 125.5 kg 124.1 kg    Examination:  General exam: NAD Respiratory system: CTA Cardiovascular system: S 1, S 2 RRR Gastrointestinal system: BS present, soft, nt Central nervous system: Alert,  Extremities: right foot with large wound plantar area, necrotics toes.    Data Reviewed: I have personally reviewed following labs and imaging studies  CBC: Recent Labs  Lab 04/15/22 1654 04/16/22 1501 04/17/22 0918 04/18/22 0914  WBC 8.7 8.5 7.6 12.1*  NEUTROABS 6.9  --  5.9  --   HGB 11.9* 10.8* 11.4* 11.3*  HCT 39.5 34.8* 36.3 36.4  MCV 97.1 95.9 94.3 94.3  PLT 291 268 267 [id number]    Basic Metabolic Panel: Recent Labs  Lab 04/15/22 1654 04/16/22 1501 04/17/22 0918 04/18/22 0914  NA 132* 130* 131* 130*  K 3.8 3.9 3.7 4.1  CL 95* 91* 93* 94*  CO2 19* 19* 23 19*  GLUCOSE 240* 236* 152* 132*  BUN 65* 76* 48* 62*  CREATININE 8.83* 9.90* 7.26* 8.82*  CALCIUM 8.7* 8.7* 9.0 8.6*  PHOS  --  9.2*  --  8.5*    GFR: Estimated Creatinine Clearance: 9.9 mL/min (A) (by C-G formula based on SCr of 8.82 mg/dL (H)). Liver Function Tests: Recent Labs  Lab 04/15/22 1654 04/16/22 1501 04/17/22 0918 04/18/22 0914  AST 21  --  18  --   ALT 22  --  15  --   ALKPHOS 417*  --  353*  --    BILITOT 0.9  --  0.7  --   PROT 7.4  --  7.1  --   ALBUMIN 3.8 3.6 3.7 3.6    No results for input(s): "LIPASE", "AMYLASE" in the last 168 hours. No results for input(s): "AMMONIA" in the last 168 hours. Coagulation Profile: Recent Labs  Lab 04/15/22 1654  INR 1.2    Cardiac Enzymes: No results for input(s): "CKTOTAL", "CKMB", "CKMBINDEX", "TROPONINI" in the last 168 hours. BNP (last 3 results) No results for input(s): "PROBNP" in the last 8760 hours. HbA1C: Recent Labs    04/16/22 0215  HGBA1C 7.3*    CBG: Recent Labs  Lab 04/17/22 1526 04/17/22 2001 04/17/22 2359 04/18/22 0342 04/18/22 0823  GLUCAP 139* 225* 254* 141* 114*    Lipid Profile: No results for input(s): "CHOL", "HDL", "LDLCALC", "TRIG", "CHOLHDL", "LDLDIRECT" in the last 72 hours. Thyroid Function Tests: No results for input(s): "TSH", "T4TOTAL", "FREET4", "T3FREE", "THYROIDAB" in the last 72 hours. Anemia Panel: No results for input(s): "VITAMINB12", "  FOLATE", "FERRITIN", "TIBC", "IRON", "RETICCTPCT" in the last 72 hours. Sepsis Labs: Recent Labs  Lab 04/15/22 1654 04/15/22 1925  LATICACIDVEN 1.6 1.6     Recent Results (from the past 240 hour(s))  Culture, blood (Routine x 2)     Status: None (Preliminary result)   Collection Time: 04/15/22  4:54 PM   Specimen: BLOOD  Result Value Ref Range Status   Specimen Description BLOOD LEFT ANTECUBITAL  Final   Special Requests   Final    BOTTLES DRAWN AEROBIC AND ANAEROBIC Blood Culture adequate volume   Culture   Final    NO GROWTH 3 DAYS Performed at [facility], [address], [place], [place] [address]    Report Status PENDING  Incomplete  Culture, blood (Routine x 2)     Status: None (Preliminary result)   Collection Time: 04/15/22  7:24 PM   Specimen: BLOOD  Result Value Ref Range Status   Specimen Description BLOOD RIGHT ANTECUBITAL  Final   Special Requests   Final    BOTTLES DRAWN AEROBIC AND ANAEROBIC Blood Culture  adequate volume   Culture   Final    NO GROWTH 3 DAYS Performed at [facility], [address] [place], [place] [address]    Report Status PENDING  Incomplete  Resp panel by RT-PCR (RSV, Flu A&B, Covid) Anterior Nasal Swab     Status: None   Collection Time: 04/15/22  7:25 PM   Specimen: Anterior Nasal Swab  Result Value Ref Range Status   SARS Coronavirus 2 by RT PCR NEGATIVE NEGATIVE Final    Comment: (NOTE) SARS-CoV-2 target nucleic acids are NOT DETECTED.  The SARS-CoV-2 RNA is generally detectable in upper respiratory specimens during the acute phase of infection. The lowest concentration of SARS-CoV-2 viral copies this assay can detect is 138 copies/mL. A negative result does not preclude SARS-Cov-2 infection and should not be used as the sole basis for treatment or other patient management decisions. A negative result may occur with  improper specimen collection/handling, submission of specimen other than nasopharyngeal swab, presence of viral mutation(s) within the areas targeted by this assay, and inadequate number of viral copies(<138 copies/mL). A negative result must be combined with clinical observations, patient history, and epidemiological information. The expected result is Negative.  Fact Sheet for Patients:  EntrepreneurPulse.com.au  Fact Sheet for Healthcare Providers:  IncredibleEmployment.be  This test is no t yet approved or cleared by the [place] FDA and  has been authorized for detection and/or diagnosis of SARS-CoV-2 by FDA under an Emergency Use Authorization (EUA). This EUA will remain  in effect (meaning this test can be used) for the duration of the COVID-19 declaration under Section 564(b)(1) of the Act, 21 U.S.C.section 360bbb-3(b)(1), unless the authorization is terminated  or revoked sooner.       Influenza A by PCR NEGATIVE NEGATIVE Final   Influenza B by PCR NEGATIVE NEGATIVE Final     Comment: (NOTE) The Xpert Xpress SARS-CoV-2/FLU/RSV plus assay is intended as an aid in the diagnosis of influenza from Nasopharyngeal swab specimens and should not be used as a sole basis for treatment. Nasal washings and aspirates are unacceptable for Xpert Xpress SARS-CoV-2/FLU/RSV testing.  Fact Sheet for Patients: EntrepreneurPulse.com.au  Fact Sheet for Healthcare Providers: IncredibleEmployment.be  This test is not yet approved or cleared by the [place] FDA and has been authorized for detection and/or diagnosis of SARS-CoV-2 by FDA under an Emergency Use Authorization (EUA). This EUA will remain in effect (  meaning this test can be used) for the duration of the COVID-19 declaration under Section 564(b)(1) of the Act, 21 U.S.C. section 360bbb-3(b)(1), unless the authorization is terminated or revoked.     Resp Syncytial Virus by PCR NEGATIVE NEGATIVE Final    Comment: (NOTE) Fact Sheet for Patients: EntrepreneurPulse.com.au  Fact Sheet for Healthcare Providers: IncredibleEmployment.be  This test is not yet approved or cleared by the [place] FDA and has been authorized for detection and/or diagnosis of SARS-CoV-2 by FDA under an Emergency Use Authorization (EUA). This EUA will remain in effect (meaning this test can be used) for the duration of the COVID-19 declaration under Section 564(b)(1) of the Act, 21 U.S.C. section 360bbb-3(b)(1), unless the authorization is terminated or revoked.  Performed at [facility], [address] [place], [place] [address]          Radiology Studies: PERIPHERAL VASCULAR CATHETERIZATION  Result Date: 04/17/2022 See surgical note for result.  MR FOOT RIGHT WO CONTRAST  Result Date: 04/17/2022 CLINICAL DATA:  Chronic nonhealing right foot ulcers. History of prior amputation. EXAM: MRI OF THE RIGHT FOREFOOT WITHOUT CONTRAST TECHNIQUE:  Multiplanar, multisequence MR imaging of the right forefoot was performed. No intravenous contrast was administered. COMPARISON:  Right foot x-rays from yesterday. MRI right foot dated April 24, 2021. FINDINGS: Bones/Joint/Cartilage Bony destruction of the first through fourth metatarsal heads, bases of the first through fourth proximal phalanges, and head of the first proximal phalanx. There is mild marrow edema and prominent decreased T1 marrow signal associated with the bony destruction. Prior fifth ray amputation no fracture or dislocation. Joint spaces are preserved. No joint effusion. Ligaments Lisfranc ligament is intact. Muscles and Tendons No tenosynovitis. Increased T2 signal within the intrinsic muscles of the forefoot, nonspecific, but likely related to diabetic muscle changes. Soft tissue Large plantar and lateral forefoot soft tissue ulceration extending near bone. Mild dorsal foot soft tissue swelling. No fluid collection. No soft tissue mass. IMPRESSION: 1. Large plantar and lateral forefoot soft tissue ulceration with underlying subacute to chronic osteomyelitis of the first through fourth metatarsal heads, bases of the first through fourth proximal phalanges, and head of the first proximal phalanx. No abscess. Electronically Signed   By: Titus Dubin M.D.   On: 04/17/2022 07:47        Scheduled Meds:  (feeding supplement) PROSource Plus  30 mL Oral BID BM   arformoterol  15 mcg Nebulization Q12H   aspirin EC  81 mg Oral Daily   atorvastatin  40 mg Oral Daily   budesonide  0.5 mg Nebulization BID   Chlorhexidine Gluconate Cloth  6 each Topical Q0600   heparin  5,000 Units Subcutaneous Q8H   insulin aspart  0-6 Units Subcutaneous Q4H   insulin glargine-yfgn  15 Units Subcutaneous Daily   ipratropium-albuterol  3 mL Nebulization TID   linaclotide  290 mcg Oral q morning   metroNIDAZOLE  500 mg Oral Q8H   midodrine  10 mg Oral BID   montelukast  10 mg Oral QHS   multivitamin   1 tablet Oral QHS   nutrition supplement (JUVEN)  1 packet Oral BID BM   sevelamer carbonate  2,400 mg Oral TID WC   Continuous Infusions:  cefTRIAXone (ROCEPHIN)  IV 2 g (04/18/22 0003)     LOS: 3 days    Time spent: 35 minutes    Lequan Dobratz A Adewale Pucillo, MD [facility]   If 7PM-7AM, please contact night-coverage [email or web address]  04/18/2022, 2:16 PM

## 2022-04-19 DIAGNOSIS — L089 Local infection of the skin and subcutaneous tissue, unspecified: Secondary | ICD-10-CM

## 2022-04-19 DIAGNOSIS — M86371 Chronic multifocal osteomyelitis, right ankle and foot: Secondary | ICD-10-CM

## 2022-04-19 LAB — GLUCOSE, CAPILLARY
Glucose-Capillary: 164 mg/dL — ABNORMAL HIGH (ref 70–99)
Glucose-Capillary: 146 mg/dL — ABNORMAL HIGH (ref 70–99)
Glucose-Capillary: 202 mg/dL — ABNORMAL HIGH (ref 70–99)
Glucose-Capillary: 235 mg/dL — ABNORMAL HIGH (ref 70–99)
Glucose-Capillary: 211 mg/dL — ABNORMAL HIGH (ref 70–99)
Glucose-Capillary: 214 mg/dL — ABNORMAL HIGH (ref 70–99)
Glucose-Capillary: 217 mg/dL — ABNORMAL HIGH (ref 70–99)

## 2022-04-19 MED ORDER — SORBITOL 70 % SOLN
30.0000 mL | Freq: Once | Status: AC
  Administered 2022-04-19: 30 mL via ORAL
  Filled 2022-04-19: qty 30

## 2022-04-19 MED ORDER — SODIUM CHLORIDE 0.9 % IV SOLN
2.0000 g | INTRAVENOUS | Status: DC
  Administered 2022-04-20: 2 g via INTRAVENOUS
  Filled 2022-04-19: qty 12.5
  Filled 2022-04-19: qty 2

## 2022-04-19 MED ORDER — SODIUM CHLORIDE 0.9 % IV SOLN
2.0000 g | Freq: Once | INTRAVENOUS | Status: AC
  Administered 2022-04-19: 2 g via INTRAVENOUS
  Filled 2022-04-19: qty 12.5

## 2022-04-19 MED ORDER — IPRATROPIUM-ALBUTEROL 0.5-2.5 (3) MG/3ML IN SOLN
3.0000 mL | Freq: Two times a day (BID) | RESPIRATORY_TRACT | Status: DC
  Administered 2022-04-19 – 2022-04-20 (×2): 3 mL via RESPIRATORY_TRACT
  Filled 2022-04-19 (×2): qty 3

## 2022-04-19 MED ORDER — VANCOMYCIN HCL IN DEXTROSE 1-5 GM/200ML-% IV SOLN
1000.0000 mg | INTRAVENOUS | Status: DC
  Administered 2022-04-20: 1000 mg via INTRAVENOUS
  Filled 2022-04-19 (×2): qty 200

## 2022-04-19 NOTE — Treatment Plan (Signed)
Diagnosis: Rt foot infection with osteomyelitis Baseline Creatinine ESRD  Culture Result: NA  Allergies  Allergen Reactions   Dust Mite Extract Shortness Of Breath and Itching   Mold Extract [Trichophyton] Shortness Of Breath and Itching   Pollen Extract Shortness Of Breath    Itching, shortness of breath, asthma like symptoms   Ozempic (0.25 Or 0.5 Mg-Dose) [Semaglutide(0.25 Or 0.'5mg'$ -Dos)] Other (See Comments)    pancreatitis   Sulfa Antibiotics Itching   Misc. Sulfonamide Containing Compounds    Feraheme [Ferumoxytol] Itching    Uses benadryl so she can get the medicine    OPAT Orders Discharge antibiotics: Vancomycin 1 gram during dilaysis on tue/Thu/Saturday + cefepime 2 grams IV every dilaysis- Tue/Thur/sat for 6 weeks  Duration: 6 weeks End Date: 05/28/22   Labs weekly while on IV antibiotics: _X_ CBC with differential __ BMP _X_ CMP  _X_ ESR _X_ Vancomycin trough  _  Fax weekly lab results  promptly to [id number]) [phone]  Clinic Follow Up Appt:with Dr.Trek Kimball 05/14/22 at 11.45Am   Call [phone] with any questions

## 2022-04-19 NOTE — TOC Progression Note (Addendum)
Transition of Care [facility]) - Progression Note    Patient Details  Name: [name] MRN: [id number] Date of Birth: [birth year]  Transition of [facility] CM/SW [place], RN Phone Number: 04/19/2022, 2:39 PM  Clinical Narrative:     Reached out to jason with [facility] to see if they would be able to accept the patient for PT and OT, They are not in network, has had [facility] in 2022, I reached out to [facility] and asked if they would take her again, awaiting a call back  has [facility] at home community care mgt as well as [place] for community case mgt   Expected Discharge Plan: [place] Barriers to Discharge: Continued Medical Work up  Expected Discharge Plan and Services   Discharge Planning Services: CM Consult   Living arrangements for the past 2 months: Single Family Home                 DME Arranged: N/A         HH Arranged: PT, OT   Date HH Agency Contacted: 04/19/22 Time [place]: [id number] Representative spoke with at [place]: [place] Determinants of Health ([place]) Interventions SDOH Screenings   Food Insecurity: No Food Insecurity (04/16/2022)  Housing: Low Risk  (04/16/2022)  Transportation Needs: No Transportation Needs (04/16/2022)  Utilities: Not At Risk (04/16/2022)  Depression (PHQ2-9): Low Risk  (09/05/2021)  Financial Resource Strain: High Risk (03/24/2017)  Physical Activity: Inactive (03/24/2017)  Social Connections: Somewhat Isolated (03/24/2017)  Stress: No Stress Concern Present (03/24/2017)  Tobacco Use: Low Risk  (04/18/2022)    Readmission Risk Interventions     No data to display

## 2022-04-19 NOTE — Plan of Care (Signed)

## 2022-04-19 NOTE — Progress Notes (Signed)
Pharmacy Antibiotic Note  [name] is a 52 y.o. female w/ ESRD on TThSa HD, admitted on 04/15/2022 with Wound infection.  Pharmacy has been consulted for Vancomycin dosing  -ID following: plan abx during dialysis for 4-6 weeks - also on Ceftriaxone and Metronidazole  Plan:  Continue Vancomycin 1000 mg IV Q TThSa following dialysis session.  -will check vancomycin level prior to 3rd hemodialysis session  Pharmacy will continue to follow and will adjust abx dosing as warranted.  Temp (24hrs), Avg:98.6 F (37 C), Min:97.6 F (36.4 C), Max:99.9 F (37.7 C)   Recent Labs  Lab 04/15/22 1654 04/15/22 1925 04/16/22 1501 04/17/22 0918 04/18/22 0914  WBC 8.7  --  8.5 7.6 12.1*  CREATININE 8.83*  --  9.90* 7.26* 8.82*  LATICACIDVEN 1.6 1.6  --   --   --      Estimated Creatinine Clearance: 9.9 mL/min (A) (by C-G formula based on SCr of 8.82 mg/dL (H)).    Allergies  Allergen Reactions   Dust Mite Extract Shortness Of Breath and Itching   Mold Extract [Trichophyton] Shortness Of Breath and Itching   Pollen Extract Shortness Of Breath    Itching, shortness of breath, asthma like symptoms   Ozempic (0.25 Or 0.5 Mg-Dose) [Semaglutide(0.25 Or 0.'5mg'$ -Dos)] Other (See Comments)    pancreatitis   Sulfa Antibiotics Itching   Misc. Sulfonamide Containing Compounds    Feraheme [Ferumoxytol] Itching    Uses benadryl so she can get the medicine    Antimicrobials this admission: 2/19 Ceftriaxone >>  2/19 Flagyl >>     x 7 days 2/19 Vancomycin >>   Microbiology results: 2/19 BCx: NG4d  Thank you for allowing pharmacy to be a part of this patient's care.  Chinita Greenland PharmD Clinical Pharmacist 04/19/2022

## 2022-04-19 NOTE — Progress Notes (Signed)
Nutrition Follow-up  DOCUMENTATION CODES:   Morbid obesity  INTERVENTION:   -Continue double protein portions with meals -Continue 1 packet Juven BID, each packet provides 95 calories, 2.5 grams of protein (collagen), and 9.8 grams of carbohydrate (3 grams sugar); also contains 7 grams of L-arginine and L-glutamine, 300 mg vitamin C, 15 mg vitamin E, 1.2 mcg vitamin B-12, 9.5 mg zinc, 200 mg calcium, and 1.5 g  Calcium Beta-hydroxy-Beta-methylbutyrate to support wound healing  -Continue 30 ml Prosource Plus BID, each supplement provides 100 kcals and 15 grams protein -Continue renal MVI daily  NUTRITION DIAGNOSIS:   Increased nutrient needs related to wound healing as evidenced by estimated needs.  Ongoing  GOAL:   Patient will meet greater than or equal to 90% of their needs  Progressing   MONITOR:   PO intake, Supplement acceptance  REASON FOR ASSESSMENT:   Consult Wound healing  ASSESSMENT:   Pt with medical history significant for HTN, COPD on 3-4 LO2, ESRD on HD TTS, type II DM , OSA, prior osteomyelitis right foot s/p fifth ray amputation w and with chronic nonhealing ulcers, followed at wound care center, last debrided on 2/12, sent with concerns for gangrene and Osteomyelitis of the foot.  2/21- s/p aortogram  Reviewed I/O's: -1.2 L x 24 hours and -2 L since admission   Per podiatry and vascular surgery notes, due to extend of soft tissue loss, recommending amputation, however, pt is refusing and would like to continue wound care and antibiotics.   Spoke with pt at bedside. Per pt, she did not eat dinner last night, as it was cold due to returning from HD. She also complains that the meats are sometimes dry. Noted meal completions 50-100%. Pt is talking supplements, discussed importance of good meal and supplement intake to promote healing.   Wt has been stable since admission.   Per TOC notes, plan to d/c home with home health services once medically stable.    Medications reviewed and include renvela.   Lab Results  Component Value Date   HGBA1C 7.3 (H) 04/16/2022   PTA DM medications are .   Labs reviewed: Na: 130, Phos: 8.5, CBGS: 146-271 (inpatient orders for glycemic control are 0-6 units insulin aspart every 4 hours and 15 units insulin glargine-yfgn daily).    Diet Order:   Diet Order             Diet renal/carb modified with fluid restriction Diet-HS Snack? Nothing; Fluid restriction: 1200 mL Fluid; Room service appropriate? Yes; Fluid consistency: Thin  Diet effective now                   EDUCATION NEEDS:   Education needs have been addressed  Skin:  Skin Assessment: Reviewed RN Assessment  Last BM:  04/14/22  Height:   Ht Readings from Last 1 Encounters:  04/17/22 '5\' 4"'$  (1.626 m)    Weight:   Wt Readings from Last 1 Encounters:  04/18/22 124.1 kg    Ideal Body Weight:  55.7 kg  BMI:  Body mass index is 46.96 kg/m.  Estimated Nutritional Needs:   Kcal:  1950-2150  Protein:  10-125 grams  Fluid:  1000 ml + UOP    Loistine Chance, RD, LDN, [place] Registered Dietitian II Certified Diabetes Care and Education Specialist Please refer to [facility] for RD and/or RD on-call/weekend/after hours pager

## 2022-04-19 NOTE — Plan of Care (Signed)

## 2022-04-19 NOTE — TOC Progression Note (Signed)
Transition of Care [facility]) - Progression Note    Patient Details  Name: [name] MRN: [id number] Date of Birth: 06-23-1970  Transition of Care [facility] CM/SW [place], RN Phone Number: 04/19/2022, 10:31 AM  Clinical Narrative:   TOC to follow and assist with DC planning and needs, she is from home with spouse, plan is to  give her vanco and ceftazidime during dialysis for 4-6 weeks  receives outpatient dialysis treatments at [facility] on a TTS schedule, supervised by Dr Candiss Norse.   She has been refusing amputation and is trying to avoid it,  Had a right lower extremity angiogram. The results states she has good blood flow to her right lower extremity.   Expected Discharge Plan: [place] Barriers to Discharge: Continued Medical Work up  Expected Discharge Plan and Services       Living arrangements for the past 2 months: Single Family Home                                       Social Determinants of Health (SDOH) Interventions SDOH Screenings   Food Insecurity: No Food Insecurity (04/16/2022)  Housing: Low Risk  (04/16/2022)  Transportation Needs: No Transportation Needs (04/16/2022)  Utilities: Not At Risk (04/16/2022)  Depression (PHQ2-9): Low Risk  (09/05/2021)  Financial Resource Strain: High Risk (03/24/2017)  Physical Activity: Inactive (03/24/2017)  Social Connections: Somewhat Isolated (03/24/2017)  Stress: No Stress Concern Present (03/24/2017)  Tobacco Use: Low Risk  (04/18/2022)    Readmission Risk Interventions     No data to display

## 2022-04-19 NOTE — Progress Notes (Signed)
[facility]  ROUNDING NOTE   Subjective:   [name] is a 52 year old African-American female with a past medical history of end-stage renal disease, patient is on Tuesday Thursday Saturday schedule, diabetes mellitus, history of pancreatitis who came to the ER with chief complaint of foot pain. She has been admitted for Osteomyelitis of right foot ([place]) [M86.9] Acute hematogenous osteomyelitis of right foot [facility]) [M86.071]  Patient is known to our clinic and receives outpatient dialysis treatments at [facility] on a TTS schedule, supervised by Dr Candiss Norse.   Patient seen sitting up in bed, alert and oriented Reports discomfort  from right foot Appears in good spirits Denies shortness of breath  Objective:  Vital signs in last 24 hours:  Temp:  [97.7 F (36.5 C)-99.9 F (37.7 C)] 97.7 F (36.5 C) (02/23 0854) Pulse Rate:  [90-102] 95 (02/23 0854) Resp:  [17-25] 17 (02/23 0854) BP: (93-115)/(47-84) 99/84 (02/23 0854) SpO2:  [91 %-98 %] 93 % (02/23 0854) Weight:  [124.1 kg] 124.1 kg (02/22 1344)  Weight change: 3.5 kg Filed Weights   04/17/22 1142 04/18/22 0936 04/18/22 1344  Weight: 122 kg 125.5 kg 124.1 kg    Intake/Output: I/O last 3 completed shifts: In: 450 [P.O.:350; IV Piggyback:100] Out: 1500 [Other:1500]   Intake/Output this shift:  No intake/output data recorded.  Physical Exam: General: NAD  Head: Normocephalic, atraumatic. Moist oral mucosal membranes  Eyes: Anicteric  Lungs:  Clear to auscultation, normal effort  Heart: Regular rate and rhythm  Abdomen:  Soft, nontender  Extremities:  trace peripheral edema. Right foot ace bandages  Neurologic: Alert and oriented, moving all four extremities  Skin: No lesions  Access: Lt AVF    Basic Metabolic Panel: Recent Labs  Lab 04/15/22 1654 04/16/22 1501 04/17/22 0918 04/18/22 0914  NA 132* 130* 131* 130*  K 3.8 3.9 3.7 4.1  CL 95* 91* 93* 94*  CO2 19* 19* 23 19*  GLUCOSE 240*  236* 152* 132*  BUN 65* 76* 48* 62*  CREATININE 8.83* 9.90* 7.26* 8.82*  CALCIUM 8.7* 8.7* 9.0 8.6*  PHOS  --  9.2*  --  8.5*     Liver Function Tests: Recent Labs  Lab 04/15/22 1654 04/16/22 1501 04/17/22 0918 04/18/22 0914  AST 21  --  18  --   ALT 22  --  15  --   ALKPHOS 417*  --  353*  --   BILITOT 0.9  --  0.7  --   PROT 7.4  --  7.1  --   ALBUMIN 3.8 3.6 3.7 3.6    No results for input(s): "LIPASE", "AMYLASE" in the last 168 hours. No results for input(s): "AMMONIA" in the last 168 hours.  CBC: Recent Labs  Lab 04/15/22 1654 04/16/22 1501 04/17/22 0918 04/18/22 0914  WBC 8.7 8.5 7.6 12.1*  NEUTROABS 6.9  --  5.9  --   HGB 11.9* 10.8* 11.4* 11.3*  HCT 39.5 34.8* 36.3 36.4  MCV 97.1 95.9 94.3 94.3  PLT 291 268 267 260     Cardiac Enzymes: No results for input(s): "CKTOTAL", "CKMB", "CKMBINDEX", "TROPONINI" in the last 168 hours.  BNP: Invalid input(s): "POCBNP"  CBG: Recent Labs  Lab 04/18/22 2000 04/18/22 2317 04/19/22 0359 04/19/22 0829 04/19/22 1135  GLUCAP 184* 271* 164* 146* 45*     Microbiology: Results for orders placed or performed during the hospital encounter of 04/15/22  Culture, blood (Routine x 2)     Status: None (Preliminary result)  Collection Time: 04/15/22  4:54 PM   Specimen: BLOOD  Result Value Ref Range Status   Specimen Description BLOOD LEFT ANTECUBITAL  Final   Special Requests   Final    BOTTLES DRAWN AEROBIC AND ANAEROBIC Blood Culture adequate volume   Culture   Final    NO GROWTH 4 DAYS Performed at [facility], [address], [place], [place] [address]    Report Status PENDING  Incomplete  Culture, blood (Routine x 2)     Status: None (Preliminary result)   Collection Time: 04/15/22  7:24 PM   Specimen: BLOOD  Result Value Ref Range Status   Specimen Description BLOOD RIGHT ANTECUBITAL  Final   Special Requests   Final    BOTTLES DRAWN AEROBIC AND ANAEROBIC Blood Culture adequate volume    Culture   Final    NO GROWTH 4 DAYS Performed at [facility], [address] [place], [place] [address]    Report Status PENDING  Incomplete  Resp panel by RT-PCR (RSV, Flu A&B, Covid) Anterior Nasal Swab     Status: None   Collection Time: 04/15/22  7:25 PM   Specimen: Anterior Nasal Swab  Result Value Ref Range Status   SARS Coronavirus 2 by RT PCR NEGATIVE NEGATIVE Final    Comment: (NOTE) SARS-CoV-2 target nucleic acids are NOT DETECTED.  The SARS-CoV-2 RNA is generally detectable in upper respiratory specimens during the acute phase of infection. The lowest concentration of SARS-CoV-2 viral copies this assay can detect is 138 copies/mL. A negative result does not preclude SARS-Cov-2 infection and should not be used as the sole basis for treatment or other patient management decisions. A negative result may occur with  improper specimen collection/handling, submission of specimen other than nasopharyngeal swab, presence of viral mutation(s) within the areas targeted by this assay, and inadequate number of viral copies(<138 copies/mL). A negative result must be combined with clinical observations, patient history, and epidemiological information. The expected result is Negative.  Fact Sheet for Patients:  EntrepreneurPulse.com.au  Fact Sheet for Healthcare Providers:  IncredibleEmployment.be  This test is no t yet approved or cleared by the [place] FDA and  has been authorized for detection and/or diagnosis of SARS-CoV-2 by FDA under an Emergency Use Authorization (EUA). This EUA will remain  in effect (meaning this test can be used) for the duration of the COVID-19 declaration under Section 564(b)(1) of the Act, 21 U.S.C.section 360bbb-3(b)(1), unless the authorization is terminated  or revoked sooner.       Influenza A by PCR NEGATIVE NEGATIVE Final   Influenza B by PCR NEGATIVE NEGATIVE Final    Comment:  (NOTE) The Xpert Xpress SARS-CoV-2/FLU/RSV plus assay is intended as an aid in the diagnosis of influenza from Nasopharyngeal swab specimens and should not be used as a sole basis for treatment. Nasal washings and aspirates are unacceptable for Xpert Xpress SARS-CoV-2/FLU/RSV testing.  Fact Sheet for Patients: EntrepreneurPulse.com.au  Fact Sheet for Healthcare Providers: IncredibleEmployment.be  This test is not yet approved or cleared by the [place] FDA and has been authorized for detection and/or diagnosis of SARS-CoV-2 by FDA under an Emergency Use Authorization (EUA). This EUA will remain in effect (meaning this test can be used) for the duration of the COVID-19 declaration under Section 564(b)(1) of the Act, 21 U.S.C. section 360bbb-3(b)(1), unless the authorization is terminated or revoked.     Resp Syncytial Virus by PCR NEGATIVE NEGATIVE Final    Comment: (NOTE) Fact Sheet for Patients:  EntrepreneurPulse.com.au  Fact Sheet for Healthcare Providers: IncredibleEmployment.be  This test is not yet approved or cleared by the [place] FDA and has been authorized for detection and/or diagnosis of SARS-CoV-2 by FDA under an Emergency Use Authorization (EUA). This EUA will remain in effect (meaning this test can be used) for the duration of the COVID-19 declaration under Section 564(b)(1) of the Act, 21 U.S.C. section 360bbb-3(b)(1), unless the authorization is terminated or revoked.  Performed at [facility], [address], [place], [place] [address]   Aerobic Culture w Gram Stain (superficial specimen)     Status: None (Preliminary result)   Collection Time: 04/18/22  5:45 PM   Specimen: Cervix, Endocervical  Result Value Ref Range Status   Specimen Description   Final    ENDOCERVICAL Performed at [facility], [address], [place], [place] [address]    Special  Requests   Final    NONE ENDOCERVICAL Performed at [facility], [place], [place] [address]    Gram Stain NO WBC SEEN NO ORGANISMS SEEN   Final   Culture   Final    NO GROWTH < 12 HOURS Performed at [facility], [place] [address] [place], [place] [address]    Report Status PENDING  Incomplete    Coagulation Studies: No results for input(s): "LABPROT", "INR" in the last 72 hours.   Urinalysis: No results for input(s): "COLORURINE", "LABSPEC", "PHURINE", "GLUCOSEU", "HGBUR", "BILIRUBINUR", "KETONESUR", "PROTEINUR", "UROBILINOGEN", "NITRITE", "LEUKOCYTESUR" in the last 72 hours.  Invalid input(s): "APPERANCEUR"    Imaging: PERIPHERAL VASCULAR CATHETERIZATION  Result Date: 04/17/2022 See surgical note for result.    Medications:    [START ON 04/20/2022] ceFEPime (MAXIPIME) IV     [START ON 04/20/2022] vancomycin      (feeding supplement) PROSource Plus  30 mL Oral BID BM   arformoterol  15 mcg Nebulization Q12H   aspirin EC  81 mg Oral Daily   atorvastatin  40 mg Oral Daily   budesonide  0.5 mg Nebulization BID   Chlorhexidine Gluconate Cloth  6 each Topical Q0600   heparin  5,000 Units Subcutaneous Q8H   insulin aspart  0-6 Units Subcutaneous Q4H   insulin glargine-yfgn  15 Units Subcutaneous Daily   ipratropium-albuterol  3 mL Nebulization BID   linaclotide  290 mcg Oral q morning   midodrine  10 mg Oral BID   montelukast  10 mg Oral QHS   multivitamin  1 tablet Oral QHS   nutrition supplement (JUVEN)  1 packet Oral BID BM   sevelamer carbonate  2,400 mg Oral TID WC   acetaminophen **OR** acetaminophen, albuterol, albuterol, diphenhydrAMINE, HYDROmorphone (DILAUDID) injection, ondansetron **OR** ondansetron (ZOFRAN) IV, ondansetron (ZOFRAN) IV, oxyCODONE-acetaminophen  Assessment/ Plan:  [name] is a 52 y.o.  female with a past medical history of end-stage renal disease, patient is on Tuesday Thursday Saturday schedule,  diabetes mellitus, history of pancreatitis who came to the ER with chief complaint of foot pain. She has been admitted for Osteomyelitis of right foot ([place]) [M86.9] Acute hematogenous osteomyelitis of right foot ([place]) [M86.071]   [facility] TTS Left AVF 127.5kg   End stage renal disease on hemodialysis. Dialysis received yesterday, UF 1.5L as tolerated. Next treatment scheduled for Saturday.   2. Anemia of chronic kidney disease Lab Results  Component Value Date   HGB 11.3 (L) 04/18/2022    Hemoglobin acceptable. Patient receives [place] outpatient.   3. Secondary Hyperparathyroidism: with outpatient  labs: PTH 1243, phosphorus 11.9, calcium 8.9 on 07/03/21.   Lab Results  Component Value Date   PTH 134 (H) 04/21/2017   CALCIUM 8.6 (L) 04/18/2022   CAION 0.81 (LL) 10/26/2020   PHOS 8.5 (H) 04/18/2022    Calcium at goal, phosphorus remains elevated. Continue sevelamer 2400 mg with meals.  4. Diabetes mellitus type II with chronic kidney disease/renal manifestations: noninsulin dependent. Most recent hemoglobin A1c is 7.3 on 04/16/22.   Primary team to manage SSI  5. Osteomyelitis, right foot.  Appreciate vascular and podiatry input.  Angiogram completed on 04/17/22 with adequate blood flow to extremity.  Podiatry continues to recommend proximal midfoot amputation, patient refusing and would like to continue wound care and antibiotics.   ID recommending 6 weeks of IV antibiotics with dialysis, Vancomycin 1g and Cefepime 2g with each dialysis treatment. Outpatient dialysis clinic notified.    LOS: 4 Dartanian Knaggs 2/23/202411:46 AM

## 2022-04-19 NOTE — Discharge Planning (Signed)
Dr. Sandford Craze treatment plan for IV antibiotics faxed to clinic. Clinic aware of plan to discharge tomorrow.

## 2022-04-19 NOTE — Evaluation (Addendum)
Occupational Therapy Evaluation Patient Details Name: [name] MRN: [id number] DOB: March 26, 1970 Today's Date: 04/19/2022   History of Present Illness [name] is a 52 year old African-American female with a past medical history of ESRD (HD schedule T,T,S), DM, COPD on 3-4L O2, OSA, PAD, who came to the ER with chief complaint of foot pain. She has been admitted for acute hematogenous osteomyelitis of right foot. Underwent R LE angiography as a surgical intervention on 04/17/22.   Clinical Impression   [name] presents with generalized weakness, limited endurance, impaired balance, reduced fxl mobility, and pain. She lives with her husband in a single-story home w/ ramped entrance, goes to HD every T, Th, Sat. Until ~ 3 months ago, pt ambulated short distances (e.g., down ramp to get into [place] for transport to HD) with a RW; however, 2/2 fatigue, foot pain, and need for limited WBing on R LE, pt has been using a manual WC for all mobility for previous 3 months. She uses 3-4 L O2 at baseline, sometimes having to increase to 6 L. Pt has declined rehab evaluation for several days now, stating she was too tired, but she was willing to participate today. She was able to complete bed mobility with Mod I and to transfer sit<>stand multiple times w/ RW and without any physical assistance. She could maintain good static standing balance w/ RW for ~ 3 minutes before needing a seated rest break. Pt is unable to ambulate with RW w/o putting weight on R LE, therefore declines any OOB movement other than static standing. Pt endorses chronic 5/5 generalized pain, denies any increased pain in R foot. Discussed DC options, w/ consideration of stint at STR to assist pt in regaining ability to ambulate w/ a RW. After consideration, pt declines, states she wishes to return home and that she believes she can get back to using RW as long as she participates in [facility] and follows exercise/strength-building routine.  Recommend ongoing OT while hospitalized, w/ DC w/ HHOT.   Recommendations for follow up therapy are one component of a multi-disciplinary discharge planning process, led by the attending physician.  Recommendations may be updated based on patient status, additional functional criteria and insurance authorization.   Follow Up Recommendations  Home health OT     Assistance Recommended at Discharge Frequent or constant Supervision/Assistance  Patient can return home with the following A little help with walking and/or transfers;A little help with bathing/dressing/bathroom;Assistance with cooking/housework;Assist for transportation;Help with stairs or ramp for entrance    Functional Status Assessment  Patient has had a recent decline in their functional status and demonstrates the ability to make significant improvements in function in a reasonable and predictable amount of time.  Equipment Recommendations  None recommended by OT    Recommendations for Other Services       Precautions / Restrictions Precautions Precautions: Fall Precaution Comments: mod fall risk Restrictions Weight Bearing Restrictions: Yes RLE Weight Bearing: Non weight bearing      Mobility Bed Mobility Overal bed mobility: Modified Independent                  Transfers Overall transfer level: Needs assistance Equipment used: Rolling walker (2 wheels) Transfers: Sit to/from Stand Sit to Stand: Supervision           General transfer comment: able to maintain weight only on L LE with transfers      Balance Overall balance assessment: Needs assistance Sitting-balance support: Feet supported, Single extremity supported Sitting balance-Leahy  Scale: Good     Standing balance support: Bilateral upper extremity supported, Reliant on assistive device for balance Standing balance-Leahy Scale: Good Standing balance comment: pt able to maintain good static standing balance with RW and while adhering  to WB precautions. Unable to ambulate, however, while maintain NWB on R LE                           ADL either performed or assessed with clinical judgement   ADL                                               Vision         Perception     Praxis      Pertinent Vitals/Pain Pain Assessment Pain Assessment: 0-10 Pain Score: 5  Pain Location: generalized Pain Descriptors / Indicators: Aching Pain Intervention(s): Repositioned     Hand Dominance Right   Extremity/Trunk Assessment Upper Extremity Assessment Upper Extremity Assessment: Generalized weakness   Lower Extremity Assessment Lower Extremity Assessment: Generalized weakness   Cervical / Trunk Assessment Cervical / Trunk Assessment: Normal   Communication Communication Communication: No difficulties   Cognition Arousal/Alertness: Awake/alert Behavior During Therapy: WFL for tasks assessed/performed Overall Cognitive Status: Within Functional Limits for tasks assessed                                       General Comments       Exercises Other Exercises Other Exercises: UE, LE therex sitting EOB; discussion re:PoC, DC recs   Shoulder Instructions      Home Living Family/patient expects to be discharged to:: Private residence Living Arrangements: Spouse/significant other;Children Available Help at Discharge: Family;Available 24 hours/day Type of Home: House Home Access: Ramped entrance     Home Layout: One level     Bathroom Shower/Tub: Teacher, early years/pre: Handicapped height     Home Equipment: Rollator (4 wheels);Tub bench;Grab bars - tub/shower;Grab bars - toilet;BSC/3in1;Electric scooter;Wheelchair - manual;Hand held Engineering geologist (2 wheels)          Prior Functioning/Environment Prior Level of Function : Needs assist             Mobility Comments: Able to ambulate short distances with RW until ~ 3 months  ago. Since then, uses RW only for bed<>WC transfers, uses WC all other times ADLs Comments: Husband assists with transfers to shower, all IADLs. Pt Mod I in bed mobility, transfers to toilet & WC, and dressing.        OT Problem List: Decreased strength;Decreased range of motion;Decreased activity tolerance;Impaired balance (sitting and/or standing);Decreased coordination      OT Treatment/Interventions: Self-care/ADL training;Therapeutic exercise;Patient/family education;Balance training;Energy conservation;Therapeutic activities;DME and/or AE instruction    OT Goals(Current goals can be found in the care plan section) Acute Rehab OT Goals Patient Stated Goal: to get back to walking with RW OT Goal Formulation: With patient Time For Goal Achievement: 05/03/22 Potential to Achieve Goals: Good ADL Goals Pt Will Perform Grooming: standing;with supervision Pt Will Transfer to Toilet: stand pivot transfer;ambulating;with min assist Pt/caregiver will Perform Home Exercise Program: Increased ROM;Increased strength;With written HEP provided  OT Frequency: Min 2X/week    Co-evaluation  AM-PAC OT "6 Clicks" Daily Activity     Outcome Measure Help from another person eating meals?: None Help from another person taking care of personal grooming?: None Help from another person toileting, which includes using toliet, bedpan, or urinal?: A Little Help from another person bathing (including washing, rinsing, drying)?: A Lot Help from another person to put on and taking off regular upper body clothing?: None Help from another person to put on and taking off regular lower body clothing?: A Little 6 Click Score: 20   End of Session Equipment Utilized During Treatment: Rolling walker (2 wheels)  Activity Tolerance: Patient tolerated treatment well Patient left: in bed;with call bell/phone within reach  OT Visit Diagnosis: Other abnormalities of gait and mobility (R26.89);Muscle  weakness (generalized) (M62.81)                Time: [id number] OT Time Calculation (min): 28 min Charges:  OT General Charges $OT Visit: 1 Visit OT Evaluation $OT Eval Low Complexity: 1 Low OT Treatments $Self Care/Home Management : 23-37 mins Josiah Lobo, PhD, MS, OTR/L 04/19/22, 10:42 AM

## 2022-04-19 NOTE — Progress Notes (Signed)
ID Pt doing okay BP 97/64 (BP Location: Right Arm)   Pulse 77   Temp 97.7 F (36.5 C)   Resp 16   Ht '5\' 4"'$  (1.626 m)   Wt 124.1 kg   LMP 10/14/2012 (Approximate)   SpO2 93%   BMI 46.96 kg/m   Rt foot dresisng not removed today  Impression/recommendation Chronic rt foot infection- with osteomyelitis- pt will go home on IV vanco and cefepime which she will receive during dialysis for 6 weeks- I will follow her as OP  Discussed the plan with her in detail and answered her questions

## 2022-04-19 NOTE — Progress Notes (Signed)
PROGRESS NOTE    [name]  [id number] DOB: Jul 18, 1970 DOA: 04/15/2022 PCP: Evern Bio, NP   Brief Narrative: 52 year old with past medical history significant for hypertension, COPD on 3 to 4 L of oxygen, ESRD on hemodialysis TTS, diabetes type 2, OSA, prior osteomyelitis right foot status post recent weight amputation with and with chronic nonhealing ulcer, followed at wound care center, last debrided on 2/12, sent to the ED with concern for gangrene and osteomyelitis of the right foot.  Nephrology and podiatry and vascular surgery consulted.  Vascular perform angiogram 2/21, right lower extremity demonstrates significant calcification of her vessels.  There is no significant stenosis in the right common femoral artery, profunda femoris artery, superficial femoral artery or popliteal artery.  The anterior tibial artery was large and patent.  The posterior tibial artery provided a second runoff vessel  to the foot, it was a smaller but not stenotic until  there was intrinsic diseases in the mid to distal foot portion of the posterior tibial artery.  The peroneal artery was small and did not appear to continue on the way to the ankle.   Assessment & Plan:   Principal Problem:   Osteomyelitis of right foot (HCC) Active Problems:   High anion gap metabolic acidosis   ESRD on hemodialysis ([place])   Uncontrolled type 2 diabetes mellitus with hyperglycemia, with long-term current use of insulin (HCC)   Hypertension   COPD exacerbation (HCC)   Chronic diastolic heart failure (HCC)   Obesity, Class III, BMI 40-49.9 (morbid obesity) ([place])   Obesity hypoventilation syndrome (HCC)   Chronic respiratory failure with hypoxia and hypercapnia (HCC)   PVD (peripheral vascular disease) (HCC)  1-Osteomyelitis of the right foot:  Peripheral vascular disease s/p prior history of with amputation of the fifth ray  right foot amputation  -2/21 MRI: Large plantar and lateral forefoot soft tissue  ulceration with underlying subacute to chronic osteomyelitis of the first through fourth metatarsal heads, bases of the first through fourth proximal phalanges, and head of the first proximal phalanx. No abscess. -No septic joint on MRI -2/21 S/P Arteriogram: Showed posterior tibial artery provided a second runoff vessel  to the foot, it was a smaller but not stenotic until  there was intrinsic diseases in the mid to distal foot portion of the posterior tibial artery. -Podiatry recommended Midfoot amputation, vs Chopart amputation.  , patient has decline amputation.  -Continue with IV vancomycin and ceftriaxone -ID consult for antibiotics recommendations. Plan for 6 weeks IV antibiotics until 05/28/2022 with HD>   2-ESRD on hemodialysis -Metabolic acidosis, Hyponatremia.  -Management with hemodialysis.  -Patient had hemodialysis 2/20//  2/22  3-uncontrolled type 2 diabetes with hyperglycemia, long-term use of insulin Continue with a sliding scale and basal insulin  Chronic diastolic heart failure: euvolemic  COPD exacerbation Obesity hypoventilation syndrome Respiratory failure with hypoxia, on 3-4 L oxygen at baseline.  Mild exacerbation Continue with  DuoNeb, Pulmicort, resume Brovana Continue as needed nebulizer and is  Hypertension : Hold metoprolol  Has hypotension on midodrine.   PVD; Vascular consulted and following.   Morbid obesity class III BMI 45 Needs lifestyle modification    Nutrition Problem: Increased nutrient needs Etiology: wound healing    Signs/Symptoms: estimated needs    Interventions: MVI, Prostat, Juven  Estimated body mass index is 46.96 kg/m as calculated from the following:   Height as of this encounter: '5\' 4"'$  (1.626 m).   Weight as of this encounter: 124.1 kg.   DVT prophylaxis:  Heparin Code Status: Full code Family Communication: Care discussed with patient Disposition Plan:  Status is: Inpatient Remains inpatient appropriate  because: management of foot infection.     Consultants:  Vascular Nephrology  Podiatry   Procedures:  Arteriogram.   Antimicrobials:    Subjective: She doesn't feel ready to go home today.  We discussed plan for IV antibiotics with HD. Plan to discharge tomorrow.   Objective: Vitals:   04/18/22 2350 04/18/22 2352 04/19/22 0746 04/19/22 0854  BP: (!) 93/47 107/62  99/84  Pulse: 100 97  95  Resp: '20 20  17  '$ Temp: 98.7 F (37.1 C) 98.7 F (37.1 C)  97.7 F (36.5 C)  TempSrc:      SpO2: 91%  95% 93%  Weight:      Height:        Intake/Output Summary (Last 24 hours) at 04/19/2022 1323 Last data filed at 04/18/2022 1530 Gross per 24 hour  Intake 350 ml  Output 1500 ml  Net -1150 ml    Filed Weights   04/17/22 1142 04/18/22 0936 04/18/22 1344  Weight: 122 kg 125.5 kg 124.1 kg    Examination:  General exam: NAD Respiratory system: CTA Cardiovascular system: S 1, S 2 RRR Gastrointestinal system: BS present, soft nt Central nervous system: Alert,  Extremities: right foot with large wound plantar area, necrotics toes.    Data Reviewed: I have personally reviewed following labs and imaging studies  CBC: Recent Labs  Lab 04/15/22 1654 04/16/22 1501 04/17/22 0918 04/18/22 0914  WBC 8.7 8.5 7.6 12.1*  NEUTROABS 6.9  --  5.9  --   HGB 11.9* 10.8* 11.4* 11.3*  HCT 39.5 34.8* 36.3 36.4  MCV 97.1 95.9 94.3 94.3  PLT 291 268 267 [id number]    Basic Metabolic Panel: Recent Labs  Lab 04/15/22 1654 04/16/22 1501 04/17/22 0918 04/18/22 0914  NA 132* 130* 131* 130*  K 3.8 3.9 3.7 4.1  CL 95* 91* 93* 94*  CO2 19* 19* 23 19*  GLUCOSE 240* 236* 152* 132*  BUN 65* 76* 48* 62*  CREATININE 8.83* 9.90* 7.26* 8.82*  CALCIUM 8.7* 8.7* 9.0 8.6*  PHOS  --  9.2*  --  8.5*    GFR: Estimated Creatinine Clearance: 9.9 mL/min (A) (by C-G formula based on SCr of 8.82 mg/dL (H)). Liver Function Tests: Recent Labs  Lab 04/15/22 1654 04/16/22 1501 04/17/22 0918  04/18/22 0914  AST 21  --  18  --   ALT 22  --  15  --   ALKPHOS 417*  --  353*  --   BILITOT 0.9  --  0.7  --   PROT 7.4  --  7.1  --   ALBUMIN 3.8 3.6 3.7 3.6    No results for input(s): "LIPASE", "AMYLASE" in the last 168 hours. No results for input(s): "AMMONIA" in the last 168 hours. Coagulation Profile: Recent Labs  Lab 04/15/22 1654  INR 1.2    Cardiac Enzymes: No results for input(s): "CKTOTAL", "CKMB", "CKMBINDEX", "TROPONINI" in the last 168 hours. BNP (last 3 results) No results for input(s): "PROBNP" in the last 8760 hours. HbA1C: No results for input(s): "HGBA1C" in the last 72 hours.  CBG: Recent Labs  Lab 04/18/22 2000 04/18/22 2317 04/19/22 0359 04/19/22 0829 04/19/22 1135  GLUCAP 184* 271* 164* 146* 202*    Lipid Profile: No results for input(s): "CHOL", "HDL", "LDLCALC", "TRIG", "CHOLHDL", "LDLDIRECT" in the last 72 hours. Thyroid Function Tests: No results for input(s): "TSH", "T4TOTAL", "  FREET4", "T3FREE", "THYROIDAB" in the last 72 hours. Anemia Panel: No results for input(s): "VITAMINB12", "FOLATE", "FERRITIN", "TIBC", "IRON", "RETICCTPCT" in the last 72 hours. Sepsis Labs: Recent Labs  Lab 04/15/22 1654 04/15/22 1925  LATICACIDVEN 1.6 1.6     Recent Results (from the past 240 hour(s))  Culture, blood (Routine x 2)     Status: None (Preliminary result)   Collection Time: 04/15/22  4:54 PM   Specimen: BLOOD  Result Value Ref Range Status   Specimen Description BLOOD LEFT ANTECUBITAL  Final   Special Requests   Final    BOTTLES DRAWN AEROBIC AND ANAEROBIC Blood Culture adequate volume   Culture   Final    NO GROWTH 4 DAYS Performed at [facility], [address], [place], [place] [address]    Report Status PENDING  Incomplete  Culture, blood (Routine x 2)     Status: None (Preliminary result)   Collection Time: 04/15/22  7:24 PM   Specimen: BLOOD  Result Value Ref Range Status   Specimen Description BLOOD RIGHT  ANTECUBITAL  Final   Special Requests   Final    BOTTLES DRAWN AEROBIC AND ANAEROBIC Blood Culture adequate volume   Culture   Final    NO GROWTH 4 DAYS Performed at [facility], [address], [place], [place] [address]    Report Status PENDING  Incomplete  Resp panel by RT-PCR (RSV, Flu A&B, Covid) Anterior Nasal Swab     Status: None   Collection Time: 04/15/22  7:25 PM   Specimen: Anterior Nasal Swab  Result Value Ref Range Status   SARS Coronavirus 2 by RT PCR NEGATIVE NEGATIVE Final    Comment: (NOTE) SARS-CoV-2 target nucleic acids are NOT DETECTED.  The SARS-CoV-2 RNA is generally detectable in upper respiratory specimens during the acute phase of infection. The lowest concentration of SARS-CoV-2 viral copies this assay can detect is 138 copies/mL. A negative result does not preclude SARS-Cov-2 infection and should not be used as the sole basis for treatment or other patient management decisions. A negative result may occur with  improper specimen collection/handling, submission of specimen other than nasopharyngeal swab, presence of viral mutation(s) within the areas targeted by this assay, and inadequate number of viral copies(<138 copies/mL). A negative result must be combined with clinical observations, patient history, and epidemiological information. The expected result is Negative.  Fact Sheet for Patients:  EntrepreneurPulse.com.au  Fact Sheet for Healthcare Providers:  IncredibleEmployment.be  This test is no t yet approved or cleared by the [place] FDA and  has been authorized for detection and/or diagnosis of SARS-CoV-2 by FDA under an Emergency Use Authorization (EUA). This EUA will remain  in effect (meaning this test can be used) for the duration of the COVID-19 declaration under Section 564(b)(1) of the Act, 21 U.S.C.section 360bbb-3(b)(1), unless the authorization is terminated  or revoked sooner.        Influenza A by PCR NEGATIVE NEGATIVE Final   Influenza B by PCR NEGATIVE NEGATIVE Final    Comment: (NOTE) The Xpert Xpress SARS-CoV-2/FLU/RSV plus assay is intended as an aid in the diagnosis of influenza from Nasopharyngeal swab specimens and should not be used as a sole basis for treatment. Nasal washings and aspirates are unacceptable for Xpert Xpress SARS-CoV-2/FLU/RSV testing.  Fact Sheet for Patients: EntrepreneurPulse.com.au  Fact Sheet for Healthcare Providers: IncredibleEmployment.be  This test is not yet approved or cleared by the [place] FDA and has been authorized for detection and/or diagnosis of  SARS-CoV-2 by FDA under an Emergency Use Authorization (EUA). This EUA will remain in effect (meaning this test can be used) for the duration of the COVID-19 declaration under Section 564(b)(1) of the Act, 21 U.S.C. section 360bbb-3(b)(1), unless the authorization is terminated or revoked.     Resp Syncytial Virus by PCR NEGATIVE NEGATIVE Final    Comment: (NOTE) Fact Sheet for Patients: EntrepreneurPulse.com.au  Fact Sheet for Healthcare Providers: IncredibleEmployment.be  This test is not yet approved or cleared by the [place] FDA and has been authorized for detection and/or diagnosis of SARS-CoV-2 by FDA under an Emergency Use Authorization (EUA). This EUA will remain in effect (meaning this test can be used) for the duration of the COVID-19 declaration under Section 564(b)(1) of the Act, 21 U.S.C. section 360bbb-3(b)(1), unless the authorization is terminated or revoked.  Performed at [facility], [address], [place], [place] [address]   Aerobic Culture w Gram Stain (superficial specimen)     Status: None (Preliminary result)   Collection Time: 04/18/22  5:45 PM   Specimen: Cervix, Endocervical  Result Value Ref Range Status   Specimen Description   Final     ENDOCERVICAL Performed at [facility], [address], [place], [place] [address]    Special Requests   Final    NONE ENDOCERVICAL Performed at [facility], [address], [place] [address]    Gram Stain NO WBC SEEN NO ORGANISMS SEEN   Final   Culture   Final    NO GROWTH < 12 HOURS Performed at [facility], [place] [address], [place], [place] [address]    Report Status PENDING  Incomplete         Radiology Studies: No results found.      Scheduled Meds:  (feeding supplement) PROSource Plus  30 mL Oral BID BM   arformoterol  15 mcg Nebulization Q12H   aspirin EC  81 mg Oral Daily   atorvastatin  40 mg Oral Daily   budesonide  0.5 mg Nebulization BID   Chlorhexidine Gluconate Cloth  6 each Topical Q0600   heparin  5,000 Units Subcutaneous Q8H   insulin aspart  0-6 Units Subcutaneous Q4H   insulin glargine-yfgn  15 Units Subcutaneous Daily   ipratropium-albuterol  3 mL Nebulization BID   linaclotide  290 mcg Oral q morning   midodrine  10 mg Oral BID   montelukast  10 mg Oral QHS   multivitamin  1 tablet Oral QHS   nutrition supplement (JUVEN)  1 packet Oral BID BM   sevelamer carbonate  2,400 mg Oral TID WC   Continuous Infusions:  [START ON 04/20/2022] ceFEPime (MAXIPIME) IV     [START ON 04/20/2022] vancomycin       LOS: 4 days    Time spent: 35 minutes    Sarita Hakanson A Aries Townley, MD [facility]   If 7PM-7AM, please contact night-coverage [email or web address]  04/19/2022, 1:23 PM

## 2022-04-19 NOTE — Progress Notes (Addendum)
PHARMACY CONSULT NOTE FOR:  OUTPATIENT  PARENTERAL ANTIBIOTIC THERAPY (OPAT)  Indication: Chronic right foot infection Regimen:  - Vancomycin 1gm IV qHD on Tue, Thur, Sat at HD center - Cefepime 2gm IV qHD Tue, Thur, Sat at HD center End date: 05/28/2022  -Nephrology aware of above antibiotics needing administered at HD center -Labs - weekly:  CBC/D, ESR, CMP.  Check weekly pre-hemodialysis vancomycin level (or as per protocol) -Fax weekly lab results  promptly to [phone]  IV antibiotic discharge orders are pended. To discharging provider:  please sign these orders via discharge navigator,  Select New Orders & click on the button choice - Manage This Unsigned Work.     Thank you for allowing pharmacy to be a part of this patient's care.  Doreene Eland, PharmD, BCPS, BCIDP Work Cell: [phone] 04/19/2022 9:24 AM

## 2022-04-19 NOTE — Progress Notes (Signed)
PT Cancellation Note  Patient Details Name: ARITA SAFFOLD MRN: [id number] DOB: 11-21-1970   Cancelled Treatment:    Reason Eval/Treat Not Completed: Other (comment) Attempted to see pt for PT evaluation with pt received on [facility] reporting need for more time. Educated pt on ability to weight bear through R heel in post op shoe for transfers only with pt voicing understanding & understanding of no walking on RLE. Pt also reports she's already done all the therapy she's going to do for the day. Will message MD about ordering HHPT f/u. Will re-attempt to see pt as able.  Lavone Nian, PT, DPT 04/19/22, 2:31 PM   [name] 04/19/2022, 2:29 PM

## 2022-04-20 LAB — RENAL FUNCTION PANEL
Albumin: 3.6 g/dL (ref 3.5–5.0)
Anion gap: 17 — ABNORMAL HIGH (ref 5–15)
BUN: 61 mg/dL — ABNORMAL HIGH (ref 6–20)
CO2: 21 mmol/L — ABNORMAL LOW (ref 22–32)
Calcium: 8.9 mg/dL (ref 8.9–10.3)
Chloride: 92 mmol/L — ABNORMAL LOW (ref 98–111)
Creatinine, Ser: 8.07 mg/dL — ABNORMAL HIGH (ref 0.44–1.00)
GFR, Estimated: 6 mL/min — ABNORMAL LOW (ref >60)
Glucose, Bld: 195 mg/dL — ABNORMAL HIGH (ref 70–99)
Phosphorus: 8.2 mg/dL — ABNORMAL HIGH (ref 2.5–4.6)
Potassium: 4.6 mmol/L (ref 3.5–5.1)
Sodium: 130 mmol/L — ABNORMAL LOW (ref 135–145)

## 2022-04-20 LAB — CBC
HCT: 36.8 % (ref 36.0–46.0)
Hemoglobin: 11.1 g/dL — ABNORMAL LOW (ref 12.0–15.0)
MCH: 29.2 pg (ref 26.0–34.0)
MCHC: 30.2 g/dL (ref 30.0–36.0)
MCV: 96.8 fL (ref 80.0–100.0)
Platelets: 227 10*3/uL (ref 150–400)
RBC: 3.8 MIL/uL — ABNORMAL LOW (ref 3.87–5.11)
RDW: 13.8 % (ref 11.5–15.5)
WBC: 8.8 10*3/uL (ref 4.0–10.5)
nRBC: 0 % (ref 0.0–0.2)

## 2022-04-20 LAB — GLUCOSE, CAPILLARY
Glucose-Capillary: 218 mg/dL — ABNORMAL HIGH (ref 70–99)
Glucose-Capillary: 170 mg/dL — ABNORMAL HIGH (ref 70–99)
Glucose-Capillary: 208 mg/dL — ABNORMAL HIGH (ref 70–99)

## 2022-04-20 LAB — CULTURE, BLOOD (ROUTINE X 2)
Culture: NO GROWTH
Culture: NO GROWTH
Special Requests: ADEQUATE
Special Requests: ADEQUATE

## 2022-04-20 LAB — VANCOMYCIN, RANDOM: Vancomycin Rm: 25 ug/mL

## 2022-04-20 MED ORDER — ATORVASTATIN CALCIUM 40 MG PO TABS: 40.0000 mg | ORAL_TABLET | Freq: Every day | ORAL | 3 refills | Status: DC

## 2022-04-20 MED ORDER — INSULIN DEGLUDEC 100 UNIT/ML ~~LOC~~ SOPN: 20.0000 [IU] | PEN_INJECTOR | Freq: Every day | SUBCUTANEOUS | Status: DC

## 2022-04-20 MED ORDER — CEFEPIME IV (FOR PTA / DISCHARGE USE ONLY): 2.0000 g | INTRAVENOUS | 0 refills | Status: AC

## 2022-04-20 MED ORDER — MIDODRINE HCL 5 MG PO TABS
ORAL_TABLET | ORAL | Status: AC
  Filled 2022-04-20: qty 2

## 2022-04-20 MED ORDER — VANCOMYCIN IV (FOR PTA / DISCHARGE USE ONLY): 1000.0000 mg | INTRAVENOUS | 0 refills | Status: AC

## 2022-04-20 MED ORDER — OXYCODONE-ACETAMINOPHEN 5-325 MG PO TABS: 1.0000 | ORAL_TABLET | ORAL | 0 refills | Status: AC | PRN

## 2022-04-20 NOTE — Progress Notes (Signed)
Received patient in bed to unit.  Alert and oriented.  Informed consent signed and in chart.   TX duration: 3.24 hours  Patient tolerated well.  Transported back to the room  Alert, without acute distress.  Hand-off given to patient's nurse.   Access used: Left upper arm AVF Access issues: none  Total UF removed: 1.4L Medication(s) given: Midodrine, Vancomycin, Cefipime    [facility]

## 2022-04-20 NOTE — Progress Notes (Signed)
PT Cancellation Note  Patient Details Name: [name] MRN: [id number] DOB: 1970-07-30   Cancelled Treatment:    Reason Eval/Treat Not Completed: Patient at procedure or test/unavailable. Patient at HD at this time. Will follow up as appropriate.    Kayton Dunaj A Ridley Dileo 04/20/2022, 9:33 AM

## 2022-04-20 NOTE — Progress Notes (Signed)
[facility]  ROUNDING NOTE   Subjective:   [name] is a 52 year old African-American female with a past medical history of end-stage renal disease, patient is on Tuesday Thursday Saturday schedule, diabetes mellitus, history of pancreatitis who came to the ER with chief complaint of foot pain. She has been admitted for Osteomyelitis of right foot ([place]) [M86.9] Acute hematogenous osteomyelitis of right foot ([place]) [M86.071]  Seen and examined on hemodialysis treatment. Tolerating treatment well.   Objective:  Vital signs in last 24 hours:  Temp:  [97.7 F (36.5 C)-98.6 F (37 C)] 98.6 F (37 C) (02/24 0823) Pulse Rate:  [77-96] 96 (02/24 1130) Resp:  [16-22] 20 (02/24 1130) BP: (89-120)/(35-64) 107/36 (02/24 1130) SpO2:  [93 %-100 %] 99 % (02/24 1130) Weight:  [124.8 kg] 124.8 kg (02/24 0823)  Weight change:  Filed Weights   04/18/22 0936 04/18/22 1344 04/20/22 0823  Weight: 125.5 kg 124.1 kg 124.8 kg    Intake/Output: No intake/output data recorded.   Intake/Output this shift:  No intake/output data recorded.  Physical Exam: General: NAD  Head: Normocephalic, atraumatic. Moist oral mucosal membranes  Eyes: Anicteric  Lungs:  Clear to auscultation, normal effort  Heart: Regular rate and rhythm  Abdomen:  Soft, nontender  Extremities:  trace peripheral edema. Right foot in clean dressings  Neurologic: Alert and oriented, moving all four extremities  Skin: No lesions  Access: Lt AVF    Basic Metabolic Panel: Recent Labs  Lab 04/15/22 1654 04/16/22 1501 04/17/22 0918 04/18/22 0914 04/20/22 0839  NA 132* 130* 131* 130* 130*  K 3.8 3.9 3.7 4.1 4.6  CL 95* 91* 93* 94* 92*  CO2 19* 19* 23 19* 21*  GLUCOSE 240* 236* 152* 132* 195*  BUN 65* 76* 48* 62* 61*  CREATININE 8.83* 9.90* 7.26* 8.82* 8.07*  CALCIUM 8.7* 8.7* 9.0 8.6* 8.9  PHOS  --  9.2*  --  8.5* 8.2*     Liver Function Tests: Recent Labs  Lab 04/15/22 1654 04/16/22 1501  04/17/22 0918 04/18/22 0914 04/20/22 0839  AST 21  --  18  --   --   ALT 22  --  15  --   --   ALKPHOS 417*  --  353*  --   --   BILITOT 0.9  --  0.7  --   --   PROT 7.4  --  7.1  --   --   ALBUMIN 3.8 3.6 3.7 3.6 3.6    No results for input(s): "LIPASE", "AMYLASE" in the last 168 hours. No results for input(s): "AMMONIA" in the last 168 hours.  CBC: Recent Labs  Lab 04/15/22 1654 04/16/22 1501 04/17/22 0918 04/18/22 0914 04/20/22 0839  WBC 8.7 8.5 7.6 12.1* 8.8  NEUTROABS 6.9  --  5.9  --   --   HGB 11.9* 10.8* 11.4* 11.3* 11.1*  HCT 39.5 34.8* 36.3 36.4 36.8  MCV 97.1 95.9 94.3 94.3 96.8  PLT 291 268 267 260 227     Cardiac Enzymes: No results for input(s): "CKTOTAL", "CKMB", "CKMBINDEX", "TROPONINI" in the last 168 hours.  BNP: Invalid input(s): "POCBNP"  CBG: Recent Labs  Lab 04/19/22 1721 04/19/22 1945 04/19/22 2321 04/20/22 0406 04/20/22 0809  GLUCAP 211* 214* 217* 218* 170*     Microbiology: Results for orders placed or performed during the hospital encounter of 04/15/22  Culture, blood (Routine x 2)     Status: None   Collection Time: 04/15/22  4:54 PM   Specimen:  BLOOD  Result Value Ref Range Status   Specimen Description BLOOD LEFT ANTECUBITAL  Final   Special Requests   Final    BOTTLES DRAWN AEROBIC AND ANAEROBIC Blood Culture adequate volume   Culture   Final    NO GROWTH 5 DAYS Performed at [facility], [address] [place], [place] [address]    Report Status 04/20/2022 FINAL  Final  Culture, blood (Routine x 2)     Status: None   Collection Time: 04/15/22  7:24 PM   Specimen: BLOOD  Result Value Ref Range Status   Specimen Description BLOOD RIGHT ANTECUBITAL  Final   Special Requests   Final    BOTTLES DRAWN AEROBIC AND ANAEROBIC Blood Culture adequate volume   Culture   Final    NO GROWTH 5 DAYS Performed at [facility], [address] [place], [place] [address]    Report Status 04/20/2022 FINAL   Final  Resp panel by RT-PCR (RSV, Flu A&B, Covid) Anterior Nasal Swab     Status: None   Collection Time: 04/15/22  7:25 PM   Specimen: Anterior Nasal Swab  Result Value Ref Range Status   SARS Coronavirus 2 by RT PCR NEGATIVE NEGATIVE Final    Comment: (NOTE) SARS-CoV-2 target nucleic acids are NOT DETECTED.  The SARS-CoV-2 RNA is generally detectable in upper respiratory specimens during the acute phase of infection. The lowest concentration of SARS-CoV-2 viral copies this assay can detect is 138 copies/mL. A negative result does not preclude SARS-Cov-2 infection and should not be used as the sole basis for treatment or other patient management decisions. A negative result may occur with  improper specimen collection/handling, submission of specimen other than nasopharyngeal swab, presence of viral mutation(s) within the areas targeted by this assay, and inadequate number of viral copies(<138 copies/mL). A negative result must be combined with clinical observations, patient history, and epidemiological information. The expected result is Negative.  Fact Sheet for Patients:  EntrepreneurPulse.com.au  Fact Sheet for Healthcare Providers:  IncredibleEmployment.be  This test is no t yet approved or cleared by the [place] FDA and  has been authorized for detection and/or diagnosis of SARS-CoV-2 by FDA under an Emergency Use Authorization (EUA). This EUA will remain  in effect (meaning this test can be used) for the duration of the COVID-19 declaration under Section 564(b)(1) of the Act, 21 U.S.C.section 360bbb-3(b)(1), unless the authorization is terminated  or revoked sooner.       Influenza A by PCR NEGATIVE NEGATIVE Final   Influenza B by PCR NEGATIVE NEGATIVE Final    Comment: (NOTE) The Xpert Xpress SARS-CoV-2/FLU/RSV plus assay is intended as an aid in the diagnosis of influenza from Nasopharyngeal swab specimens and should not be  used as a sole basis for treatment. Nasal washings and aspirates are unacceptable for Xpert Xpress SARS-CoV-2/FLU/RSV testing.  Fact Sheet for Patients: EntrepreneurPulse.com.au  Fact Sheet for Healthcare Providers: IncredibleEmployment.be  This test is not yet approved or cleared by the [place] FDA and has been authorized for detection and/or diagnosis of SARS-CoV-2 by FDA under an Emergency Use Authorization (EUA). This EUA will remain in effect (meaning this test can be used) for the duration of the COVID-19 declaration under Section 564(b)(1) of the Act, 21 U.S.C. section 360bbb-3(b)(1), unless the authorization is terminated or revoked.     Resp Syncytial Virus by PCR NEGATIVE NEGATIVE Final    Comment: (NOTE) Fact Sheet for Patients: EntrepreneurPulse.com.au  Fact Sheet for Healthcare Providers: IncredibleEmployment.be  This test is not yet approved or cleared by the [place] and has been authorized for detection and/or diagnosis of SARS-CoV-2 by FDA under an Emergency Use Authorization (EUA). This EUA will remain in effect (meaning this test can be used) for the duration of the COVID-19 declaration under Section 564(b)(1) of the Act, 21 U.S.C. section 360bbb-3(b)(1), unless the authorization is terminated or revoked.  Performed at [facility], [address] [place], [place] [address]   Aerobic Culture w Gram Stain (superficial specimen)     Status: None (Preliminary result)   Collection Time: 04/18/22  5:45 PM   Specimen: Cervix, Endocervical  Result Value Ref Range Status   Specimen Description   Final    ENDOCERVICAL Performed at [facility], [address], [place], [place] [address]    Special Requests   Final    NONE ENDOCERVICAL Performed at [facility], [address] [place], [place] [address]    Gram Stain NO WBC SEEN NO ORGANISMS  SEEN   Final   Culture   Final    NO GROWTH 2 DAYS Performed at [facility], [place] [address] [place], [place] [address]    Report Status PENDING  Incomplete    Coagulation Studies: No results for input(s): "LABPROT", "INR" in the last 72 hours.   Urinalysis: No results for input(s): "COLORURINE", "LABSPEC", "PHURINE", "GLUCOSEU", "HGBUR", "BILIRUBINUR", "KETONESUR", "PROTEINUR", "UROBILINOGEN", "NITRITE", "LEUKOCYTESUR" in the last 72 hours.  Invalid input(s): "APPERANCEUR"    Imaging: No results found.   Medications:    ceFEPime (MAXIPIME) IV 2 g (04/20/22 1109)   vancomycin      (feeding supplement) PROSource Plus  30 mL Oral BID BM   arformoterol  15 mcg Nebulization Q12H   aspirin EC  81 mg Oral Daily   atorvastatin  40 mg Oral Daily   budesonide  0.5 mg Nebulization BID   Chlorhexidine Gluconate Cloth  6 each Topical Q0600   heparin  5,000 Units Subcutaneous Q8H   insulin aspart  0-6 Units Subcutaneous Q4H   insulin glargine-yfgn  15 Units Subcutaneous Daily   ipratropium-albuterol  3 mL Nebulization BID   linaclotide  290 mcg Oral q morning   midodrine  10 mg Oral BID   montelukast  10 mg Oral QHS   multivitamin  1 tablet Oral QHS   nutrition supplement (JUVEN)  1 packet Oral BID BM   sevelamer carbonate  2,400 mg Oral TID WC   acetaminophen **OR** acetaminophen, albuterol, diphenhydrAMINE, HYDROmorphone (DILAUDID) injection, ondansetron **OR** ondansetron (ZOFRAN) IV, oxyCODONE-acetaminophen  Assessment/ Plan:  [name] is a 52 y.o.  female with a past medical history of end-stage renal disease, patient is on Tuesday Thursday Saturday schedule, diabetes mellitus, history of pancreatitis who came to the ER with chief complaint of foot pain. She has been admitted for Osteomyelitis of right foot ([place]) [M86.9] Acute hematogenous osteomyelitis of right foot ([place]) [M86.071]   [facility] TTS Left AVF 127.5kg   End stage renal disease on  hemodialysis. Seen and examined on hemodialysis treatment. Tolerating treatment well. Continue TTS schedule.   2. Anemia of chronic kidney disease Lab Results  Component Value Date   HGB 11.1 (L) 04/20/2022    Hemoglobin acceptable. Patient receives [place] outpatient.   3. Secondary Hyperparathyroidism: with hyperphosphatemia  Lab Results  Component Value Date   PTH 134 (H) 04/21/2017   CALCIUM 8.9 04/20/2022   CAION 0.81 (LL) 10/26/2020   PHOS 8.2 (  H) 04/20/2022    Continue sevelamer 2400 mg with meals.  4. Diabetes mellitus type II with chronic kidney disease/renal manifestations: noninsulin dependent. Most recent hemoglobin A1c is 7.3 on 04/16/22.   Continue glucose control.   5. Osteomyelitis, right foot.  Appreciate vascular and podiatry input.  Angiogram completed on 04/17/22 with adequate blood flow to extremity.  Podiatry continues to recommend proximal midfoot amputation, patient refusing and would like to continue wound care and antibiotics.   ID recommending 6 weeks of IV antibiotics with dialysis, Vancomycin 1g and Cefepime 2g with each dialysis treatment. Outpatient dialysis clinic notified.    LOS: 5 Ladaija Dimino 2/24/202411:43 AM

## 2022-04-20 NOTE — TOC Progression Note (Signed)
Transition of Care [facility]) - Progression Note    Patient Details  Name: LEEAN VAQUEZ MRN: [id number] Date of Birth: 1970/08/15  [facility]) CM/SW Contact  Eileen Stanford, LCSW Phone Number: 04/20/2022, 2:51 PM  Clinical Narrative:    Confirmed with Cherly with [facility] she can service pt. Notified her of dc today. MD and RN updated.   Expected Discharge Plan: [place] Barriers to Discharge: Continued Medical Work up  Expected Discharge Plan and Services   Discharge Planning Services: CM Consult   Living arrangements for the past 2 months: Single Family Home Expected Discharge Date: 04/20/22               DME Arranged: N/A         HH Arranged: PT, OT   Date HH Agency Contacted: 04/19/22 Time [place]: [id number] Representative spoke with at [place]: [place] ([place]) Interventions SDOH Screenings   Food Insecurity: No Food Insecurity (04/16/2022)  Housing: Low Risk  (04/16/2022)  Transportation Needs: No Transportation Needs (04/16/2022)  Utilities: Not At Risk (04/16/2022)  Depression (PHQ2-9): Low Risk  (09/05/2021)  Financial Resource Strain: High Risk (03/24/2017)  Physical Activity: Inactive (03/24/2017)  Social Connections: Somewhat Isolated (03/24/2017)  Stress: No Stress Concern Present (03/24/2017)  Tobacco Use: Low Risk  (04/18/2022)    Readmission Risk Interventions     No data to display

## 2022-04-20 NOTE — Discharge Summary (Signed)
Physician Discharge Summary   Patient: [name] MRN: [id number] DOB: [birth year]  Admit date:     04/15/2022  Discharge date: 04/20/22  Discharge Physician: Elmarie Shiley   PCP: Evern Bio, NP   Recommendations at discharge:   Needs 6 weeks IV antibiotics with HD Needs follow up with Podiatry and ID    Discharge Diagnoses: Principal Problem:   Osteomyelitis of right foot ([place]) Active Problems:   High anion gap metabolic acidosis   ESRD on hemodialysis ([place])   Uncontrolled type 2 diabetes mellitus with hyperglycemia, with long-term current use of insulin (HCC)   Hypertension   COPD exacerbation (HCC)   Chronic diastolic heart failure (HCC)   Obesity, Class III, BMI 40-49.9 (morbid obesity) ([place])   Obesity hypoventilation syndrome (HCC)   Chronic respiratory failure with hypoxia and hypercapnia (HCC)   PVD (peripheral vascular disease) ([place])   Right foot infection  Resolved Problems:   * No resolved hospital problems. *  Hospital Course: 52 year old with past medical history significant for hypertension, COPD on 3 to 4 L of oxygen, ESRD on hemodialysis TTS, diabetes type 2, OSA, prior osteomyelitis right foot status post recent weight amputation with and with chronic nonhealing ulcer, followed at wound care center, last debrided on 2/12, sent to the ED with concern for gangrene and osteomyelitis of the right foot.   Nephrology and podiatry and vascular surgery consulted.  Vascular perform angiogram 2/21, right lower extremity demonstrates significant calcification of her vessels.  There is no significant stenosis in the right common femoral artery, profunda femoris artery, superficial femoral artery or popliteal artery.  The anterior tibial artery was large and patent.  The posterior tibial artery provided a second runoff vessel  to the foot, it was a smaller but not stenotic until  there was intrinsic diseases in the mid to distal foot portion of the posterior tibial  artery.  The peroneal artery was small and did not appear to continue on the way to the ankle.   Assessment and Plan:  1-Osteomyelitis of the right foot:  Peripheral vascular disease s/p prior history of with amputation of the fifth ray  right foot amputation  -2/21 MRI: Large plantar and lateral forefoot soft tissue ulceration with underlying subacute to chronic osteomyelitis of the first through fourth metatarsal heads, bases of the first through fourth proximal phalanges, and head of the first proximal phalanx. No abscess. -No septic joint on MRI -2/21 S/P Arteriogram: Showed posterior tibial artery provided a second runoff vessel  to the foot, it was a smaller but not stenotic until  there was intrinsic diseases in the mid to distal foot portion of the posterior tibial artery. -Podiatry recommended Midfoot amputation, vs Chopart amputation.  , patient has decline amputation.  -Continue with IV vancomycin and ceftriaxone -ID consult for antibiotics recommendations. Plan for 6 weeks IV antibiotics until 05/28/2022 with HD>   stable for discharge   2-ESRD on hemodialysis -Metabolic acidosis, Hyponatremia.  -Management with hemodialysis.  -Patient had hemodialysis 2/20//  2/22, 2/24   3-uncontrolled type 2 diabetes with hyperglycemia, long-term use of insulin Continue with a sliding scale and basal insulin   Chronic diastolic heart failure: euvolemic   COPD exacerbation Obesity hypoventilation syndrome Respiratory failure with hypoxia, on 3-4 L oxygen at baseline.  Mild exacerbation Continue with  DuoNeb, Pulmicort, resume Brovana Continue as needed nebulizer and is Stable  Hypertension : Hold metoprolol  Has Chronic hypotension on midodrine.    PVD; Vascular consulted and following.  Morbid obesity class III BMI 45 Needs lifestyle modification       Nutrition Problem: Increased nutrient needs Etiology: wound healing           Consultants: Podiatry, Nephrology   Procedures performed: None Disposition: Home Diet recommendation:  Discharge Diet Orders (From admission, onward)     Start     Ordered   04/20/22 0000  Diet - low sodium heart healthy        04/20/22 1358           Carb modified diet DISCHARGE MEDICATION: Allergies as of 04/20/2022       Reactions   Dust Mite Extract Shortness Of Breath, Itching   Mold Extract [trichophyton] Shortness Of Breath, Itching   Pollen Extract Shortness Of Breath   Itching, shortness of breath, asthma like symptoms   Ozempic (0.25 Or 0.5 Mg-dose) [semaglutide(0.25 Or 0.'5mg'$ -dos)] Other (See Comments)   pancreatitis   Sulfa Antibiotics Itching   Misc. Sulfonamide Containing Compounds    Feraheme [ferumoxytol] Itching   Uses benadryl so she can get the medicine        Medication List     STOP taking these medications    metoprolol tartrate 50 MG tablet Commonly known as: LOPRESSOR       TAKE these medications    acetaminophen 500 MG tablet Commonly known as: TYLENOL Take 1,000 mg by mouth every 6 (six) hours as needed for mild pain, fever or headache.   albuterol (2.5 MG/3ML) 0.083% nebulizer solution Commonly known as: PROVENTIL Take 3 mLs (2.5 mg total) by nebulization every 6 (six) hours as needed for wheezing or shortness of breath.   albuterol 108 (90 Base) MCG/ACT inhaler Commonly known as: VENTOLIN HFA Inhale 2 puffs into the lungs every 6 (six) hours as needed for wheezing or shortness of breath.   aspirin EC 81 MG tablet Take 81 mg by mouth daily.   atorvastatin 40 MG tablet Commonly known as: LIPITOR Take 1 tablet (40 mg total) by mouth daily.   budesonide 0.5 MG/2ML nebulizer solution Commonly known as: PULMICORT Take by nebulization. What changed: Another medication with the same name was removed. Continue taking this medication, and follow the directions you see here.   ceFEPime  IVPB Commonly known as: MAXIPIME Inject 2 g into the vein Every  Tuesday,Thursday,and Saturday with dialysis. Cefepime 2gm IV qHD on Tue, Thur, Sat Indication: Chronic right foot infection Last Day of Therapy: 05/28/2022 Labs - weekly:  CBC/D, ESR, CMP.  Check weekly pre-hemodialysis vancomycin level (or as per protocol) Fax weekly lab results  promptly to [phone] To be given at Hemodialysis center   Cholecalciferol 100 MCG (4000 UT) Caps Take 1 capsule by mouth once a week.   clindamycin 1 % lotion Commonly known as: CLEOCIN T Apply 1 application topically daily.   ferric citrate 1 GM 210 MG(Fe) tablet Commonly known as: AURYXIA Take 210 mg by mouth 3 (three) times daily with meals. Only taking once a day most days   formoterol 20 MCG/2ML nebulizer solution Commonly known as: PERFOROMIST   insulin degludec 100 UNIT/ML FlexTouch Pen Commonly known as: TRESIBA Inject 20 Units into the skin daily. What changed: how much to take   insulin lispro 100 UNIT/ML KwikPen Commonly known as: HUMALOG Inject into the skin.   lactulose 10 GM/15ML solution Commonly known as: CHRONULAC Take 30 mLs (20 g total) by mouth daily as needed for severe constipation.   lidocaine-prilocaine cream Commonly known as: EMLA Apply topically  as needed (For access for dialysis).   Linzess 290 MCG Caps capsule Generic drug: linaclotide Take 290 mcg by mouth every morning.   meloxicam 15 MG tablet Commonly known as: MOBIC Take 1 tablet (15 mg total) by mouth daily as needed for pain.   midodrine 10 MG tablet Commonly known as: PROAMATINE Take 20 mg by mouth daily as needed.   montelukast 10 MG tablet Commonly known as: Singulair Take 1 tablet (10 mg total) by mouth at bedtime.   oxyCODONE-acetaminophen 5-325 MG tablet Commonly known as: PERCOCET/ROXICET Take 1 tablet by mouth every 4 (four) hours as needed for up to 3 days for severe pain or moderate pain.   OXYGEN Inhale 2-4 L into the lungs.   pregabalin 50 MG capsule Commonly known as:  LYRICA Take 100 mg by mouth every 8 (eight) hours as needed.   PRILOSEC PO   Renvela 2.4 g Pack Generic drug: sevelamer carbonate Take 2.4 g by mouth 3 (three) times daily.   vancomycin  IVPB Inject 1,000 mg into the vein Every Tuesday,Thursday,and Saturday with dialysis. Vancomycin 1gm IV qHD on Tue,Thur, Sat  Indication: Chronic right foot infection Last Day of Therapy: 05/28/2022 Labs - weekly:  CBC/D, ESR, CMP.  Check weekly pre-hemodialysis vancomycin level (or as per protocol) Fax weekly lab results  promptly to [phone] To be given at Hemodialysis center   [place] 175 MCG/3ML nebulizer solution Generic drug: revefenacin               Home Infusion Instuctions  (From admission, onward)           Start     Ordered   04/20/22 0000  Home infusion instructions       Question:  Instructions  Answer:  Flushing of vascular access device: 0.9% NaCl pre/post medication administration and prn patency; Heparin 100 u/ml, 39m for implanted ports and Heparin 10u/ml, 522mfor all other central venous catheters.   04/20/22 1358              Discharge Care Instructions  (From admission, onward)           Start     Ordered   04/20/22 0000  Discharge wound care:       Comments: See rec   04/20/22 1358            Discharge Exam: Filed Weights   04/18/22 1344 04/20/22 0823 04/20/22 1305  Weight: 124.1 kg 124.8 kg 123.2 kg   General; NAD  Condition at discharge: stable  The results of significant diagnostics from this hospitalization (including imaging, microbiology, ancillary and laboratory) are listed below for reference.   Imaging Studies: PERIPHERAL VASCULAR CATHETERIZATION  Result Date: 04/17/2022 See surgical note for result.  MR FOOT RIGHT WO CONTRAST  Result Date: 04/17/2022 CLINICAL DATA:  Chronic nonhealing right foot ulcers. History of prior amputation. EXAM: MRI OF THE RIGHT FOREFOOT WITHOUT CONTRAST TECHNIQUE: Multiplanar,  multisequence MR imaging of the right forefoot was performed. No intravenous contrast was administered. COMPARISON:  Right foot x-rays from yesterday. MRI right foot dated April 24, 2021. FINDINGS: Bones/Joint/Cartilage Bony destruction of the first through fourth metatarsal heads, bases of the first through fourth proximal phalanges, and head of the first proximal phalanx. There is mild marrow edema and prominent decreased T1 marrow signal associated with the bony destruction. Prior fifth ray amputation no fracture or dislocation. Joint spaces are preserved. No joint effusion. Ligaments Lisfranc ligament is intact. Muscles and Tendons No tenosynovitis. Increased T2  signal within the intrinsic muscles of the forefoot, nonspecific, but likely related to diabetic muscle changes. Soft tissue Large plantar and lateral forefoot soft tissue ulceration extending near bone. Mild dorsal foot soft tissue swelling. No fluid collection. No soft tissue mass. IMPRESSION: 1. Large plantar and lateral forefoot soft tissue ulceration with underlying subacute to chronic osteomyelitis of the first through fourth metatarsal heads, bases of the first through fourth proximal phalanges, and head of the first proximal phalanx. No abscess. Electronically Signed   By: Titus Dubin M.D.   On: 04/17/2022 07:47   DG Foot 2 Views Right  Result Date: 04/15/2022 CLINICAL DATA:  Right foot wounds EXAM: RIGHT FOOT - 2 VIEW COMPARISON:  04/21/2021, MRI 04/24/2021 FINDINGS: Previous fifth transmetatarsal amputation, no acute findings at this location. Interval progression of bony destructive change involving the distal fourth metatarsal and base of the fourth proximal phalanx. Osseous destructive change involving the head of third metatarsal and base of third proximal phalanx. Similar findings involving head of second metatarsal and base of second proximal phalanx with probable pathologic fracture at the head of second metatarsal. Widening  of the first MTP joint with erosive changes at the base of first proximal phalanx and head of first metatarsal also concerning for septic arthritis and osteomyelitis. Small erosion head of the first proximal phalanx. Vascular calcifications. Multiple ulcerations along the plantar surface of the foot. No frank soft tissue gas. IMPRESSION: 1. Previous fifth transmetatarsal amputation. 2. Interval progression of bony destructive change involving the distal second third and fourth metatarsals as well as the second third and fourth proximal phalanges consistent with osteomyelitis. 3. Widening of the first MTP joint with erosive changes at the base of first proximal phalanx and head of first metatarsal consistent with septic joint and osteomyelitis. New erosive changes at the head of the first proximal phalanx also consistent with osteomyelitis. Electronically Signed   By: Donavan Foil M.D.   On: 04/15/2022 20:45   DG Chest 2 View  Result Date: 04/15/2022 CLINICAL DATA:  Sepsis EXAM: CHEST - 2 VIEW COMPARISON:  X-ray 12/02/2021 and older FINDINGS: Calcifications of the tracheobronchial tree. Underinflation with some basilar atelectasis or scar. Enlarged cardiopericardial silhouette with increasing interstitial changes. Peribronchial thickening. No pneumothorax. Vascular calcifications. Films are under penetrated IMPRESSION: Enlarged cardiopericardial silhouette with vascular congestion and interstitial changes. Acute process is possible. Please correlate with symptoms and recommend follow-up Electronically Signed   By: Jill Side M.D.   On: 04/15/2022 17:26    Microbiology: Results for orders placed or performed during the hospital encounter of 04/15/22  Culture, blood (Routine x 2)     Status: None   Collection Time: 04/15/22  4:54 PM   Specimen: BLOOD  Result Value Ref Range Status   Specimen Description BLOOD LEFT ANTECUBITAL  Final   Special Requests   Final    BOTTLES DRAWN AEROBIC AND ANAEROBIC  Blood Culture adequate volume   Culture   Final    NO GROWTH 5 DAYS Performed at [facility], [address] [place], [place] [address]    Report Status 04/20/2022 FINAL  Final  Culture, blood (Routine x 2)     Status: None   Collection Time: 04/15/22  7:24 PM   Specimen: BLOOD  Result Value Ref Range Status   Specimen Description BLOOD RIGHT ANTECUBITAL  Final   Special Requests   Final    BOTTLES DRAWN AEROBIC AND ANAEROBIC Blood Culture adequate volume   Culture   Final  NO GROWTH 5 DAYS Performed at [facility], [address] [place], [place] [address]    Report Status 04/20/2022 FINAL  Final  Resp panel by RT-PCR (RSV, Flu A&B, Covid) Anterior Nasal Swab     Status: None   Collection Time: 04/15/22  7:25 PM   Specimen: Anterior Nasal Swab  Result Value Ref Range Status   SARS Coronavirus 2 by RT PCR NEGATIVE NEGATIVE Final    Comment: (NOTE) SARS-CoV-2 target nucleic acids are NOT DETECTED.  The SARS-CoV-2 RNA is generally detectable in upper respiratory specimens during the acute phase of infection. The lowest concentration of SARS-CoV-2 viral copies this assay can detect is 138 copies/mL. A negative result does not preclude SARS-Cov-2 infection and should not be used as the sole basis for treatment or other patient management decisions. A negative result may occur with  improper specimen collection/handling, submission of specimen other than nasopharyngeal swab, presence of viral mutation(s) within the areas targeted by this assay, and inadequate number of viral copies(<138 copies/mL). A negative result must be combined with clinical observations, patient history, and epidemiological information. The expected result is Negative.  Fact Sheet for Patients:  EntrepreneurPulse.com.au  Fact Sheet for Healthcare Providers:  IncredibleEmployment.be  This test is no t yet approved or cleared by the [place]  FDA and  has been authorized for detection and/or diagnosis of SARS-CoV-2 by FDA under an Emergency Use Authorization (EUA). This EUA will remain  in effect (meaning this test can be used) for the duration of the COVID-19 declaration under Section 564(b)(1) of the Act, 21 U.S.C.section 360bbb-3(b)(1), unless the authorization is terminated  or revoked sooner.       Influenza A by PCR NEGATIVE NEGATIVE Final   Influenza B by PCR NEGATIVE NEGATIVE Final    Comment: (NOTE) The Xpert Xpress SARS-CoV-2/FLU/RSV plus assay is intended as an aid in the diagnosis of influenza from Nasopharyngeal swab specimens and should not be used as a sole basis for treatment. Nasal washings and aspirates are unacceptable for Xpert Xpress SARS-CoV-2/FLU/RSV testing.  Fact Sheet for Patients: EntrepreneurPulse.com.au  Fact Sheet for Healthcare Providers: IncredibleEmployment.be  This test is not yet approved or cleared by the [place] FDA and has been authorized for detection and/or diagnosis of SARS-CoV-2 by FDA under an Emergency Use Authorization (EUA). This EUA will remain in effect (meaning this test can be used) for the duration of the COVID-19 declaration under Section 564(b)(1) of the Act, 21 U.S.C. section 360bbb-3(b)(1), unless the authorization is terminated or revoked.     Resp Syncytial Virus by PCR NEGATIVE NEGATIVE Final    Comment: (NOTE) Fact Sheet for Patients: EntrepreneurPulse.com.au  Fact Sheet for Healthcare Providers: IncredibleEmployment.be  This test is not yet approved or cleared by the [place] FDA and has been authorized for detection and/or diagnosis of SARS-CoV-2 by FDA under an Emergency Use Authorization (EUA). This EUA will remain in effect (meaning this test can be used) for the duration of the COVID-19 declaration under Section 564(b)(1) of the Act, 21 U.S.C. section  360bbb-3(b)(1), unless the authorization is terminated or revoked.  Performed at [facility], [place], [place] [address]   Aerobic Culture w Gram Stain (superficial specimen)     Status: None (Preliminary result)   Collection Time: 04/18/22  5:45 PM   Specimen: Cervix, Endocervical  Result Value Ref Range Status   Specimen Description   Final    ENDOCERVICAL Performed at [facility], [place],  [place], [place] [address]    Special Requests   Final    NONE ENDOCERVICAL Performed at [facility], [place], [place] [address]    Gram Stain NO WBC SEEN NO ORGANISMS SEEN   Final   Culture   Final    NO GROWTH 2 DAYS Performed at [facility], [place] [address] [place], [place] [address]    Report Status PENDING  Incomplete    Labs: CBC: Recent Labs  Lab 04/15/22 1654 04/16/22 1501 04/17/22 0918 04/18/22 0914 04/20/22 0839  WBC 8.7 8.5 7.6 12.1* 8.8  NEUTROABS 6.9  --  5.9  --   --   HGB 11.9* 10.8* 11.4* 11.3* 11.1*  HCT 39.5 34.8* 36.3 36.4 36.8  MCV 97.1 95.9 94.3 94.3 96.8  PLT 291 268 267 260 [id number]   Basic Metabolic Panel: Recent Labs  Lab 04/15/22 1654 04/16/22 1501 04/17/22 0918 04/18/22 0914 04/20/22 0839  NA 132* 130* 131* 130* 130*  K 3.8 3.9 3.7 4.1 4.6  CL 95* 91* 93* 94* 92*  CO2 19* 19* 23 19* 21*  GLUCOSE 240* 236* 152* 132* 195*  BUN 65* 76* 48* 62* 61*  CREATININE 8.83* 9.90* 7.26* 8.82* 8.07*  CALCIUM 8.7* 8.7* 9.0 8.6* 8.9  PHOS  --  9.2*  --  8.5* 8.2*   Liver Function Tests: Recent Labs  Lab 04/15/22 1654 04/16/22 1501 04/17/22 0918 04/18/22 0914 04/20/22 0839  AST 21  --  18  --   --   ALT 22  --  15  --   --   ALKPHOS 417*  --  353*  --   --   BILITOT 0.9  --  0.7  --   --   PROT 7.4  --  7.1  --   --   ALBUMIN 3.8 3.6 3.7 3.6 3.6   CBG: Recent Labs  Lab 04/19/22 1721 04/19/22 1945 04/19/22 2321 04/20/22 0406 04/20/22 0809  GLUCAP 211* 214* 217*  218* 170*    Discharge time spent: greater than 30 minutes.  Signed: Elmarie Shiley, MD [facility] 04/20/2022

## 2022-04-21 LAB — AEROBIC CULTURE W GRAM STAIN (SUPERFICIAL SPECIMEN)
Culture: NO GROWTH
Gram Stain: NONE SEEN

## 2022-04-22 DIAGNOSIS — L089 Local infection of the skin and subcutaneous tissue, unspecified: Secondary | ICD-10-CM | POA: Diagnosis not present

## 2022-04-22 DIAGNOSIS — M86371 Chronic multifocal osteomyelitis, right ankle and foot: Secondary | ICD-10-CM | POA: Diagnosis not present

## 2022-04-22 DIAGNOSIS — D631 Anemia in chronic kidney disease: Secondary | ICD-10-CM | POA: Diagnosis not present

## 2022-04-22 DIAGNOSIS — Z794 Long term (current) use of insulin: Secondary | ICD-10-CM | POA: Diagnosis not present

## 2022-04-22 DIAGNOSIS — E1165 Type 2 diabetes mellitus with hyperglycemia: Secondary | ICD-10-CM | POA: Diagnosis not present

## 2022-04-22 DIAGNOSIS — Z9981 Dependence on supplemental oxygen: Secondary | ICD-10-CM | POA: Diagnosis not present

## 2022-04-22 DIAGNOSIS — I5032 Chronic diastolic (congestive) heart failure: Secondary | ICD-10-CM | POA: Diagnosis not present

## 2022-04-22 DIAGNOSIS — Z992 Dependence on renal dialysis: Secondary | ICD-10-CM | POA: Diagnosis not present

## 2022-04-22 DIAGNOSIS — J9611 Chronic respiratory failure with hypoxia: Secondary | ICD-10-CM | POA: Diagnosis not present

## 2022-04-22 DIAGNOSIS — J441 Chronic obstructive pulmonary disease with (acute) exacerbation: Secondary | ICD-10-CM | POA: Diagnosis not present

## 2022-04-22 DIAGNOSIS — I132 Hypertensive heart and chronic kidney disease with heart failure and with stage 5 chronic kidney disease, or end stage renal disease: Secondary | ICD-10-CM | POA: Diagnosis not present

## 2022-04-22 DIAGNOSIS — E1122 Type 2 diabetes mellitus with diabetic chronic kidney disease: Secondary | ICD-10-CM | POA: Diagnosis not present

## 2022-04-22 DIAGNOSIS — L97513 Non-pressure chronic ulcer of other part of right foot with necrosis of muscle: Secondary | ICD-10-CM | POA: Diagnosis not present

## 2022-04-22 DIAGNOSIS — I70203 Unspecified atherosclerosis of native arteries of extremities, bilateral legs: Secondary | ICD-10-CM | POA: Diagnosis not present

## 2022-04-22 DIAGNOSIS — E872 Acidosis, unspecified: Secondary | ICD-10-CM | POA: Diagnosis not present

## 2022-04-22 DIAGNOSIS — Z7985 Long-term (current) use of injectable non-insulin antidiabetic drugs: Secondary | ICD-10-CM | POA: Diagnosis not present

## 2022-04-22 DIAGNOSIS — E11621 Type 2 diabetes mellitus with foot ulcer: Secondary | ICD-10-CM | POA: Diagnosis not present

## 2022-04-22 DIAGNOSIS — J9612 Chronic respiratory failure with hypercapnia: Secondary | ICD-10-CM | POA: Diagnosis not present

## 2022-04-22 DIAGNOSIS — E1151 Type 2 diabetes mellitus with diabetic peripheral angiopathy without gangrene: Secondary | ICD-10-CM | POA: Diagnosis not present

## 2022-04-22 DIAGNOSIS — N186 End stage renal disease: Secondary | ICD-10-CM | POA: Diagnosis not present

## 2022-04-22 DIAGNOSIS — I951 Orthostatic hypotension: Secondary | ICD-10-CM | POA: Diagnosis not present

## 2022-04-22 DIAGNOSIS — E1169 Type 2 diabetes mellitus with other specified complication: Secondary | ICD-10-CM | POA: Diagnosis not present

## 2022-04-23 DIAGNOSIS — Z5181 Encounter for therapeutic drug level monitoring: Secondary | ICD-10-CM | POA: Diagnosis not present

## 2022-04-23 DIAGNOSIS — Z992 Dependence on renal dialysis: Secondary | ICD-10-CM | POA: Diagnosis not present

## 2022-04-23 DIAGNOSIS — Z792 Long term (current) use of antibiotics: Secondary | ICD-10-CM | POA: Diagnosis not present

## 2022-04-23 DIAGNOSIS — N186 End stage renal disease: Secondary | ICD-10-CM | POA: Diagnosis not present

## 2022-04-23 DIAGNOSIS — M86171 Other acute osteomyelitis, right ankle and foot: Secondary | ICD-10-CM | POA: Diagnosis not present

## 2022-04-24 ENCOUNTER — Ambulatory Visit
Admission: RE | Admit: 2022-04-24 | Discharge: 2022-04-24 | Disposition: A | Discharge status: Home / Self Care | Payer: 59 | Attending: Nurse Practitioner | Admitting: Nurse Practitioner

## 2022-04-24 ENCOUNTER — Ambulatory Visit
Admission: RE | Admit: 2022-04-24 | Discharge: 2022-04-24 | Disposition: A | Discharge status: Home / Self Care | Payer: 59 | Source: Ambulatory Visit | Attending: Nurse Practitioner | Admitting: Nurse Practitioner

## 2022-04-24 ENCOUNTER — Ambulatory Visit (INDEPENDENT_AMBULATORY_CARE_PROVIDER_SITE_OTHER): Payer: 59 | Admitting: Nurse Practitioner

## 2022-04-24 VITALS — BP 112/64 | HR 104 | Ht 64.5 in | Wt >= 6400 oz

## 2022-04-24 DIAGNOSIS — I5033 Acute on chronic diastolic (congestive) heart failure: Secondary | ICD-10-CM

## 2022-04-24 DIAGNOSIS — J811 Chronic pulmonary edema: Secondary | ICD-10-CM | POA: Diagnosis not present

## 2022-04-24 DIAGNOSIS — G4700 Insomnia, unspecified: Secondary | ICD-10-CM | POA: Insufficient documentation

## 2022-04-24 DIAGNOSIS — L97401 Non-pressure chronic ulcer of unspecified heel and midfoot limited to breakdown of skin: Secondary | ICD-10-CM | POA: Diagnosis not present

## 2022-04-24 DIAGNOSIS — J449 Chronic obstructive pulmonary disease, unspecified: Secondary | ICD-10-CM

## 2022-04-24 DIAGNOSIS — F419 Anxiety disorder, unspecified: Secondary | ICD-10-CM

## 2022-04-24 DIAGNOSIS — Z992 Dependence on renal dialysis: Secondary | ICD-10-CM | POA: Diagnosis not present

## 2022-04-24 DIAGNOSIS — G8929 Other chronic pain: Secondary | ICD-10-CM | POA: Insufficient documentation

## 2022-04-24 DIAGNOSIS — E08621 Diabetes mellitus due to underlying condition with foot ulcer: Secondary | ICD-10-CM

## 2022-04-24 DIAGNOSIS — R0600 Dyspnea, unspecified: Secondary | ICD-10-CM | POA: Diagnosis not present

## 2022-04-24 DIAGNOSIS — N186 End stage renal disease: Secondary | ICD-10-CM

## 2022-04-24 DIAGNOSIS — I5032 Chronic diastolic (congestive) heart failure: Secondary | ICD-10-CM | POA: Diagnosis not present

## 2022-04-24 DIAGNOSIS — M545 Low back pain, unspecified: Secondary | ICD-10-CM | POA: Diagnosis not present

## 2022-04-24 MED ORDER — DOXEPIN HCL 10 MG PO CAPS: 10.0000 mg | ORAL_CAPSULE | Freq: Every day | ORAL | 0 refills | Status: DC

## 2022-04-24 MED ORDER — PREDNISONE 50 MG PO TABS: ORAL_TABLET | ORAL | 0 refills | Status: DC

## 2022-04-24 MED ORDER — ESCITALOPRAM OXALATE 10 MG PO TABS: 10.0000 mg | ORAL_TABLET | Freq: Every day | ORAL | 2 refills | Status: DC

## 2022-04-24 NOTE — Patient Instructions (Signed)
1) CXR 2) Follow up appt in 4 weeks 3) New meds discussed in order of how to take, do not take all at same time on same day 4) CAP form

## 2022-04-24 NOTE — Progress Notes (Signed)
Established Patient Office Visit  Subjective:  Patient ID: [name], female    DOB: 08-23-1970  Age: 52 y.o. MRN: [id number]  Chief Complaint  Patient presents with   Follow-up    Recent hospitalization, angioplasty from left leg to right leg, no blockages.  Sciatic nerve pain.  Both hips to lower back.  Back in W/C for 3 months.       Past Medical History:  Diagnosis Date   (HFpEF) heart failure with preserved ejection fraction ([place])    a. 11/2014 Echo: EF 50-55%, Gr1 DD; b. 06/2016 Echo: EF 60-65%, no rwma, mod dil LA, nl RV, triv eff.   Anxiety    Asthma    Bronchitis    CHF (congestive heart failure) (HCC)    Chronic cough    Chronic respiratory failure with hypoxia and hypercapnia ([place])    a. Home O2.  (2-4L)   CKD (chronic kidney disease), stage III (HCC)    Depression    Diabetes mellitus without complication (HCC)    Diverticulosis    Dyspnea    Dysrhythmia    per pt, no arrythmias   Environmental and seasonal allergies    Extreme obesity with alveolar hypoventilation (HCC)    Hypertension    Iron deficiency anemia    a. chronic blood loss->heavy menses.   Kidney failure    dialysis tues/thur/sat   ... left arm shunt   Orthopnea    Oxygen dependent    3l/min   Pericardial effusion    a. 11/2014 CT Chest: Mod effusion; b. 11/2014 Echo: small to mod circumferential effusion. No hemodynamic compromise.   Pneumonia    history   Poorly controlled diabetes mellitus ([place])    a. 11/2014 A1c 13.2.   Swelling of both lower extremities     Social History   Socioeconomic History   Marital status: Married    Spouse name: abjul   Number of children: Not on file   Years of education: 12   Highest education level: High school graduate  Occupational History   Occupation: Disabled  Tobacco Use   Smoking status: Never    Passive exposure: Past   Smokeless tobacco: Never  Vaping Use   Vaping Use: Never used  Substance and Sexual Activity   Alcohol use:  Not Currently    Comment: occasional   Drug use: Yes    Types: Marijuana   Sexual activity: Not Currently    Birth control/protection: None  Other Topics Concern   Not on file  Social History Narrative   Lives in [place] with her husband and 67 y/o child.  Three other children are grown and out of the house.  She is sedentary and does not routinely exercise.  On home O2.   Social Determinants of Health   Financial Resource Strain: High Risk (03/24/2017)   Overall Financial Resource Strain (CARDIA)    Difficulty of Paying Living Expenses: Very hard  Food Insecurity: No Food Insecurity (04/16/2022)   Hunger Vital Sign    Worried About Running Out of Food in the Last Year: Never true    Ran Out of Food in the Last Year: Never true  Transportation Needs: No Transportation Needs (04/16/2022)   PRAPARE - Hydrologist (Medical): No    Lack of Transportation (Non-Medical): No  Physical Activity: Inactive (03/24/2017)   Exercise Vital Sign    Days of Exercise per Week: 0 days    Minutes of Exercise per Session:  0 min  Stress: No Stress Concern Present (03/24/2017)   [place]    Feeling of Stress : Not at all  Social Connections: Somewhat Isolated (03/24/2017)   Social Connection and Isolation Panel [NHANES]    Frequency of Communication with Friends and Family: More than three times a week    Frequency of Social Gatherings with Friends and Family: Once a week    Attends Religious Services: Never    Marine scientist or Organizations: No    Attends Archivist Meetings: Never    Marital Status: Married  Human resources officer Violence: Not At Risk (04/16/2022)   Humiliation, Afraid, Rape, and Kick questionnaire    Fear of Current or Ex-Partner: No    Emotionally Abused: No    Physically Abused: No    Sexually Abused: No    Family History  Problem Relation Age of Onset   Diabetes  Mother    Hypertension Mother    Hypertension Father    Diabetes Father    Hypertension Other    Diabetes Other    Breast cancer Maternal Aunt 10   Breast cancer Maternal Aunt 50    Allergies  Allergen Reactions   Dust Mite Extract Shortness Of Breath and Itching   Eggs Or Egg-Derived Products Itching    MAKES TONGUE ITCH but she will eat, only in small amounts   Mold Extract [Trichophyton] Shortness Of Breath and Itching   Pollen Extract Shortness Of Breath    Itching, shortness of breath, asthma like symptoms   Ozempic (0.25 Or 0.5 Mg-Dose) [Semaglutide(0.25 Or 0.'5mg'$ -Dos)] Other (See Comments)    pancreatitis   Sulfa Antibiotics Itching   Misc. Sulfonamide Containing Compounds    Feraheme [Ferumoxytol] Itching    Uses benadryl so she can get the medicine    Review of Systems  Constitutional: Negative.   HENT: Negative.    Eyes: Negative.   Respiratory: Negative.    Cardiovascular: Negative.   Gastrointestinal: Negative.   Genitourinary: Negative.   Musculoskeletal:  Positive for joint pain and myalgias.  Skin: Negative.   Neurological: Negative.   Endo/Heme/Allergies: Negative.   Psychiatric/Behavioral: Negative.         Objective:   BP 112/64   Pulse (!) 104   Ht 5' 4.5" (1.638 m)   Wt (!) 502 lb 10.4 oz (228 kg)   LMP 10/14/2012 (Approximate)   SpO2 (!) 85% Comment: 4 L oxygen  BMI 84.95 kg/m   Vitals:   04/24/22 1041  BP: 112/64  Pulse: (!) 104  Height: 5' 4.5" (1.638 m)  Weight: (!) 502 lb 10.4 oz (228 kg)  SpO2: (!) 85% Comment: 4 L oxygen  BMI (Calculated): 84.98    Physical Exam Vitals reviewed.  Constitutional:      Appearance: Normal appearance.  HENT:     Head: Normocephalic.     Nose: Nose normal.     Mouth/Throat:     Mouth: Mucous membranes are moist.  Eyes:     Pupils: Pupils are equal, round, and reactive to light.  Cardiovascular:     Rate and Rhythm: Normal rate and regular rhythm.  Pulmonary:     Effort: Pulmonary  effort is normal.     Breath sounds: Normal breath sounds.  Abdominal:     General: Bowel sounds are normal.     Palpations: Abdomen is soft.  Musculoskeletal:        General: Normal range of motion.  Cervical back: Normal range of motion and neck supple.  Skin:    General: Skin is warm and dry.  Neurological:     Mental Status: She is alert and oriented to person, place, and time.  Psychiatric:        Mood and Affect: Mood normal.        Behavior: Behavior normal.      No results found for any visits on 04/24/22.  Recent Results (from the past 2160 hour(s))  Nitric oxide     Status: None   Collection Time: 02/15/22 11:31 AM  Result Value Ref Range   Nitric Oxide 17   Aerobic Culture w Gram Stain (superficial specimen)     Status: None   Collection Time: 04/01/22 11:25 AM   Specimen: Wound  Result Value Ref Range   Specimen Description      WOUND Performed at [facility], [address] [place], [place] [address]    Special Requests      RT FOOT Performed at [facility], [place], [place] [address]    Gram Stain      RARE WBC PRESENT, PREDOMINANTLY PMN FEW GRAM POSITIVE COCCI IN PAIRS RARE GRAM POSITIVE COCCI IN CLUSTERS    Culture      MODERATE STAPHYLOCOCCUS LUGDUNENSIS MODERATE CORYNEBACTERIUM AMYCOLATUM Standardized susceptibility testing for this organism is not available. Performed at [facility], [place] [address] [place], [place] [address]    Report Status 04/06/2022 FINAL    Organism ID, Bacteria STAPHYLOCOCCUS LUGDUNENSIS       Susceptibility   Staphylococcus lugdunensis - MIC*    CIPROFLOXACIN <=0.5 SENSITIVE Sensitive     ERYTHROMYCIN >=8 RESISTANT Resistant     GENTAMICIN <=0.5 SENSITIVE Sensitive     OXACILLIN 0.5 SENSITIVE Sensitive     TETRACYCLINE 2 SENSITIVE Sensitive     VANCOMYCIN <=0.5 SENSITIVE Sensitive     TRIMETH/SULFA <=10 SENSITIVE Sensitive     CLINDAMYCIN >=8 RESISTANT Resistant      RIFAMPIN <=0.5 SENSITIVE Sensitive     Inducible Clindamycin NEGATIVE Sensitive     * MODERATE STAPHYLOCOCCUS LUGDUNENSIS  Comprehensive metabolic panel     Status: Abnormal   Collection Time: 04/15/22  4:54 PM  Result Value Ref Range   Sodium 132 (L) 135 - 145 mmol/L   Potassium 3.8 3.5 - 5.1 mmol/L   Chloride 95 (L) 98 - 111 mmol/L   CO2 19 (L) 22 - 32 mmol/L   Glucose, Bld 240 (H) 70 - 99 mg/dL    Comment: Glucose reference range applies only to samples taken after fasting for at least 8 hours.   BUN 65 (H) 6 - 20 mg/dL   Creatinine, Ser 8.83 (H) 0.44 - 1.00 mg/dL   Calcium 8.7 (L) 8.9 - 10.3 mg/dL   Total Protein 7.4 6.5 - 8.1 g/dL   Albumin 3.8 3.5 - 5.0 g/dL   AST 21 15 - 41 U/L   ALT 22 0 - 44 U/L   Alkaline Phosphatase 417 (H) 38 - 126 U/L   Total Bilirubin 0.9 0.3 - 1.2 mg/dL   GFR, Estimated 5 (L) >60 mL/min    Comment: (NOTE) Calculated using the CKD-EPI Creatinine Equation (2021)    Anion gap 18 (H) 5 - 15    Comment: Performed at [facility], [address] [place], [place] [address]  Lactic acid, plasma     Status: None   Collection Time: 04/15/22  4:54 PM  Result Value  Ref Range   Lactic Acid, Venous 1.6 0.5 - 1.9 mmol/L    Comment: Performed at [facility], [address] [place], [place] [address]  CBC with Differential     Status: Abnormal   Collection Time: 04/15/22  4:54 PM  Result Value Ref Range   WBC 8.7 4.0 - 10.5 K/uL   RBC 4.07 3.87 - 5.11 MIL/uL   Hemoglobin 11.9 (L) 12.0 - 15.0 g/dL   HCT 39.5 36.0 - 46.0 %   MCV 97.1 80.0 - 100.0 fL   MCH 29.2 26.0 - 34.0 pg   MCHC 30.1 30.0 - 36.0 g/dL   RDW 13.8 11.5 - 15.5 %   Platelets 291 150 - 400 K/uL   nRBC 0.0 0.0 - 0.2 %   Neutrophils Relative % 78 %   Neutro Abs 6.9 1.7 - 7.7 K/uL   Lymphocytes Relative 12 %   Lymphs Abs 1.1 0.7 - 4.0 K/uL   Monocytes Relative 7 %   Monocytes Absolute 0.6 0.1 - 1.0 K/uL   Eosinophils Relative 1 %   Eosinophils Absolute 0.1 0.0 - 0.5  K/uL   Basophils Relative 1 %   Basophils Absolute 0.1 0.0 - 0.1 K/uL   Immature Granulocytes 1 %   Abs Immature Granulocytes 0.04 0.00 - 0.07 K/uL    Comment: Performed at [facility], [address] [place], [place] [address]  Protime-INR     Status: None   Collection Time: 04/15/22  4:54 PM  Result Value Ref Range   Prothrombin Time 15.1 11.4 - 15.2 seconds   INR 1.2 0.8 - 1.2    Comment: (NOTE) INR goal varies based on device and disease states. Performed at [facility], [address] [place], [place] [address]   Culture, blood (Routine x 2)     Status: None   Collection Time: 04/15/22  4:54 PM   Specimen: BLOOD  Result Value Ref Range   Specimen Description BLOOD LEFT ANTECUBITAL    Special Requests      BOTTLES DRAWN AEROBIC AND ANAEROBIC Blood Culture adequate volume   Culture      NO GROWTH 5 DAYS Performed at [facility], [address] [place], [place] [address]    Report Status 04/20/2022 FINAL   Sedimentation rate     Status: None   Collection Time: 04/15/22  4:54 PM  Result Value Ref Range   Sed Rate 24 0 - 30 mm/hr    Comment: Performed at [facility], [address] [place], [place] [address]  Culture, blood (Routine x 2)     Status: None   Collection Time: 04/15/22  7:24 PM   Specimen: BLOOD  Result Value Ref Range   Specimen Description BLOOD RIGHT ANTECUBITAL    Special Requests      BOTTLES DRAWN AEROBIC AND ANAEROBIC Blood Culture adequate volume   Culture      NO GROWTH 5 DAYS Performed at [facility], [address], [place], [place] [address]    Report Status 04/20/2022 FINAL   Lactic acid, plasma     Status: None   Collection Time: 04/15/22  7:25 PM  Result Value Ref Range   Lactic Acid, Venous 1.6 0.5 - 1.9 mmol/L    Comment: Performed at [facility], [address] [place], [place] [address]  Resp panel by RT-PCR (RSV, Flu A&B, Covid) Anterior Nasal Swab     Status: None    Collection Time: 04/15/22  7:25 PM   Specimen: Anterior Nasal Swab  Result Value Ref Range   SARS Coronavirus 2 by RT PCR NEGATIVE NEGATIVE    Comment: (NOTE) SARS-CoV-2 target nucleic acids are NOT DETECTED.  The SARS-CoV-2 RNA is generally detectable in upper respiratory specimens during the acute phase of infection. The lowest concentration of SARS-CoV-2 viral copies this assay can detect is 138 copies/mL. A negative result does not preclude SARS-Cov-2 infection and should not be used as the sole basis for treatment or other patient management decisions. A negative result may occur with  improper specimen collection/handling, submission of specimen other than nasopharyngeal swab, presence of viral mutation(s) within the areas targeted by this assay, and inadequate number of viral copies(<138 copies/mL). A negative result must be combined with clinical observations, patient history, and epidemiological information. The expected result is Negative.  Fact Sheet for Patients:  EntrepreneurPulse.com.au  Fact Sheet for Healthcare Providers:  IncredibleEmployment.be  This test is no t yet approved or cleared by the [place] FDA and  has been authorized for detection and/or diagnosis of SARS-CoV-2 by FDA under an Emergency Use Authorization (EUA). This EUA will remain  in effect (meaning this test can be used) for the duration of the COVID-19 declaration under Section 564(b)(1) of the Act, 21 U.S.C.section 360bbb-3(b)(1), unless the authorization is terminated  or revoked sooner.       Influenza A by PCR NEGATIVE NEGATIVE   Influenza B by PCR NEGATIVE NEGATIVE    Comment: (NOTE) The Xpert Xpress SARS-CoV-2/FLU/RSV plus assay is intended as an aid in the diagnosis of influenza from Nasopharyngeal swab specimens and should not be used as a sole basis for treatment. Nasal washings and aspirates are unacceptable for Xpert Xpress  SARS-CoV-2/FLU/RSV testing.  Fact Sheet for Patients: EntrepreneurPulse.com.au  Fact Sheet for Healthcare Providers: IncredibleEmployment.be  This test is not yet approved or cleared by the [place] FDA and has been authorized for detection and/or diagnosis of SARS-CoV-2 by FDA under an Emergency Use Authorization (EUA). This EUA will remain in effect (meaning this test can be used) for the duration of the COVID-19 declaration under Section 564(b)(1) of the Act, 21 U.S.C. section 360bbb-3(b)(1), unless the authorization is terminated or revoked.     Resp Syncytial Virus by PCR NEGATIVE NEGATIVE    Comment: (NOTE) Fact Sheet for Patients: EntrepreneurPulse.com.au  Fact Sheet for Healthcare Providers: IncredibleEmployment.be  This test is not yet approved or cleared by the [place] FDA and has been authorized for detection and/or diagnosis of SARS-CoV-2 by FDA under an Emergency Use Authorization (EUA). This EUA will remain in effect (meaning this test can be used) for the duration of the COVID-19 declaration under Section 564(b)(1) of the Act, 21 U.S.C. section 360bbb-3(b)(1), unless the authorization is terminated or revoked.  Performed at [facility], [address] [place], [place] [address]   CBG monitoring, ED     Status: Abnormal   Collection Time: 04/16/22 12:43 AM  Result Value Ref Range   Glucose-Capillary 256 (H) 70 - 99 mg/dL    Comment: Glucose reference range applies only to samples taken after fasting for at least 8 hours.  C-reactive protein     Status: None   Collection Time: 04/16/22  2:15 AM  Result Value Ref Range   CRP 0.8 <1.0 mg/dL    Comment: Performed at [facility], [place] [address], [place], [place] [address]  Beta-hydroxybutyric acid     Status: Abnormal   Collection Time: 04/16/22  2:15 AM  Result Value Ref Range   Beta-Hydroxybutyric Acid 0.34  (H) 0.05 - 0.27 mmol/L    Comment: Performed at [facility], [address] [place], [place] [address]  HIV Antibody (routine testing w rflx)     Status: None   Collection Time: 04/16/22  2:15 AM  Result Value Ref Range   HIV Screen 4th Generation wRfx Non Reactive Non Reactive    Comment: Performed at [facility], [place] [address] [place], [place] [address]  Prealbumin     Status: None   Collection Time: 04/16/22  2:15 AM  Result Value Ref Range   Prealbumin 18 18 - 38 mg/dL    Comment: Performed at [facility] [address], [place], [place] [address]  Hemoglobin A1c     Status: Abnormal   Collection Time: 04/16/22  2:15 AM  Result Value Ref Range   Hgb A1c MFr Bld 7.3 (H) 4.8 - 5.6 %    Comment: (NOTE) Pre diabetes:          5.7%-6.4%  Diabetes:              >6.4%  Glycemic control for   <7.0% adults with diabetes    Mean Plasma Glucose 162.81 mg/dL    Comment: Performed at [place] [address], [place], [place] [address]  Glucose, capillary     Status: Abnormal   Collection Time: 04/16/22  3:49 AM  Result Value Ref Range   Glucose-Capillary 202 (H) 70 - 99 mg/dL    Comment: Glucose reference range applies only to samples taken after fasting for at least 8 hours.  Glucose, capillary     Status: Abnormal   Collection Time: 04/16/22  5:20 AM  Result Value Ref Range   Glucose-Capillary 205 (H) 70 - 99 mg/dL    Comment: Glucose reference range applies only to samples taken after fasting for at least 8 hours.   Comment 1 Notify RN   Glucose, capillary     Status: Abnormal   Collection Time: 04/16/22 12:26 PM  Result Value Ref Range   Glucose-Capillary 156 (H) 70 - 99 mg/dL    Comment: Glucose reference range applies only to samples taken after fasting for at least 8 hours.  Hepatitis B surface antigen     Status: None   Collection Time: 04/16/22  2:58 PM  Result Value Ref Range   Hepatitis B Surface Ag NON REACTIVE NON REACTIVE     Comment: Performed at [facility] [address], [place], [place] [address]  Hepatitis B surface antibody,quantitative     Status: Abnormal   Collection Time: 04/16/22  2:58 PM  Result Value Ref Range   Hep B S AB Quant (Post) <3.1 (L) Immunity>9.9 mIU/mL    Comment: (NOTE)  Status of Immunity                     Anti-HBs Level  ------------------                     -------------- Inconsistent with Immunity                   0.0 - 9.9 Consistent with Immunity                          >9.9 Performed At: [facility] [address] [place], [place] [id number] Rush Farmer  MD [id number]   CBC     Status: Abnormal   Collection Time: 04/16/22  3:01 PM  Result Value Ref Range   WBC 8.5 4.0 - 10.5 K/uL   RBC 3.63 (L) 3.87 - 5.11 MIL/uL   Hemoglobin 10.8 (L) 12.0 - 15.0 g/dL   HCT 34.8 (L) 36.0 - 46.0 %   MCV 95.9 80.0 - 100.0 fL   MCH 29.8 26.0 - 34.0 pg   MCHC 31.0 30.0 - 36.0 g/dL   RDW 13.8 11.5 - 15.5 %   Platelets 268 150 - 400 K/uL   nRBC 0.0 0.0 - 0.2 %    Comment: Performed at [facility], [address] [place], [place] [address]  Renal function panel     Status: Abnormal   Collection Time: 04/16/22  3:01 PM  Result Value Ref Range   Sodium 130 (L) 135 - 145 mmol/L   Potassium 3.9 3.5 - 5.1 mmol/L   Chloride 91 (L) 98 - 111 mmol/L   CO2 19 (L) 22 - 32 mmol/L   Glucose, Bld 236 (H) 70 - 99 mg/dL    Comment: Glucose reference range applies only to samples taken after fasting for at least 8 hours.   BUN 76 (H) 6 - 20 mg/dL   Creatinine, Ser 9.90 (H) 0.44 - 1.00 mg/dL   Calcium 8.7 (L) 8.9 - 10.3 mg/dL   Phosphorus 9.2 (H) 2.5 - 4.6 mg/dL   Albumin 3.6 3.5 - 5.0 g/dL   GFR, Estimated 4 (L) >60 mL/min    Comment: (NOTE) Calculated using the CKD-EPI Creatinine Equation (2021)    Anion gap 20 (H) 5 - 15    Comment: Performed at [facility], [address] [place], [place] [address]  Glucose, capillary     Status: Abnormal    Collection Time: 04/16/22  8:46 PM  Result Value Ref Range   Glucose-Capillary 158 (H) 70 - 99 mg/dL    Comment: Glucose reference range applies only to samples taken after fasting for at least 8 hours.  Glucose, capillary     Status: Abnormal   Collection Time: 04/16/22 11:50 PM  Result Value Ref Range   Glucose-Capillary 208 (H) 70 - 99 mg/dL    Comment: Glucose reference range applies only to samples taken after fasting for at least 8 hours.  Glucose, capillary     Status: Abnormal   Collection Time: 04/17/22  4:48 AM  Result Value Ref Range   Glucose-Capillary 191 (H) 70 - 99 mg/dL    Comment: Glucose reference range applies only to samples taken after fasting for at least 8 hours.  Glucose, capillary     Status: Abnormal   Collection Time: 04/17/22  7:48 AM  Result Value Ref Range   Glucose-Capillary 156 (H) 70 - 99 mg/dL    Comment: Glucose reference range applies only to samples taken after fasting for at least 8 hours.  Comprehensive metabolic panel     Status: Abnormal   Collection Time: 04/17/22  9:18 AM  Result Value Ref Range   Sodium 131 (L) 135 - 145 mmol/L   Potassium 3.7 3.5 - 5.1 mmol/L   Chloride 93 (L) 98 - 111 mmol/L   CO2 23 22 - 32 mmol/L   Glucose, Bld 152 (H) 70 - 99 mg/dL    Comment: Glucose reference range applies only to samples taken after fasting for at least 8 hours.   BUN 48 (H) 6 - 20 mg/dL   Creatinine, Ser  7.26 (H) 0.44 - 1.00 mg/dL   Calcium 9.0 8.9 - 10.3 mg/dL   Total Protein 7.1 6.5 - 8.1 g/dL   Albumin 3.7 3.5 - 5.0 g/dL   AST 18 15 - 41 U/L   ALT 15 0 - 44 U/L   Alkaline Phosphatase 353 (H) 38 - 126 U/L   Total Bilirubin 0.7 0.3 - 1.2 mg/dL   GFR, Estimated 6 (L) >60 mL/min    Comment: (NOTE) Calculated using the CKD-EPI Creatinine Equation (2021)    Anion gap 15 5 - 15    Comment: Performed at [facility], [address] [place], [place] [address]  CBC with Differential/Platelet     Status: Abnormal   Collection Time:  04/17/22  9:18 AM  Result Value Ref Range   WBC 7.6 4.0 - 10.5 K/uL   RBC 3.85 (L) 3.87 - 5.11 MIL/uL   Hemoglobin 11.4 (L) 12.0 - 15.0 g/dL   HCT 36.3 36.0 - 46.0 %   MCV 94.3 80.0 - 100.0 fL   MCH 29.6 26.0 - 34.0 pg   MCHC 31.4 30.0 - 36.0 g/dL   RDW 13.9 11.5 - 15.5 %   Platelets 267 150 - 400 K/uL   nRBC 0.0 0.0 - 0.2 %   Neutrophils Relative % 79 %   Neutro Abs 5.9 1.7 - 7.7 K/uL   Lymphocytes Relative 12 %   Lymphs Abs 0.9 0.7 - 4.0 K/uL   Monocytes Relative 7 %   Monocytes Absolute 0.6 0.1 - 1.0 K/uL   Eosinophils Relative 2 %   Eosinophils Absolute 0.1 0.0 - 0.5 K/uL   Basophils Relative 0 %   Basophils Absolute 0.0 0.0 - 0.1 K/uL   Immature Granulocytes 0 %   Abs Immature Granulocytes 0.03 0.00 - 0.07 K/uL    Comment: Performed at [facility], [address] [place], [place] [address]  Glucose, capillary     Status: Abnormal   Collection Time: 04/17/22 11:47 AM  Result Value Ref Range   Glucose-Capillary 123 (H) 70 - 99 mg/dL    Comment: Glucose reference range applies only to samples taken after fasting for at least 8 hours.  Glucose, capillary     Status: Abnormal   Collection Time: 04/17/22  3:26 PM  Result Value Ref Range   Glucose-Capillary 139 (H) 70 - 99 mg/dL    Comment: Glucose reference range applies only to samples taken after fasting for at least 8 hours.  Glucose, capillary     Status: Abnormal   Collection Time: 04/17/22  8:01 PM  Result Value Ref Range   Glucose-Capillary 225 (H) 70 - 99 mg/dL    Comment: Glucose reference range applies only to samples taken after fasting for at least 8 hours.  Glucose, capillary     Status: Abnormal   Collection Time: 04/17/22 11:59 PM  Result Value Ref Range   Glucose-Capillary 254 (H) 70 - 99 mg/dL    Comment: Glucose reference range applies only to samples taken after fasting for at least 8 hours.  Glucose, capillary     Status: Abnormal   Collection Time: 04/18/22  3:42 AM  Result Value Ref  Range   Glucose-Capillary 141 (H) 70 - 99 mg/dL    Comment: Glucose reference range applies only to samples taken after fasting for at least 8 hours.  Glucose, capillary     Status: Abnormal   Collection Time: 04/18/22  8:23 AM  Result Value Ref Range   Glucose-Capillary 114 (  H) 70 - 99 mg/dL    Comment: Glucose reference range applies only to samples taken after fasting for at least 8 hours.  CBC     Status: Abnormal   Collection Time: 04/18/22  9:14 AM  Result Value Ref Range   WBC 12.1 (H) 4.0 - 10.5 K/uL   RBC 3.86 (L) 3.87 - 5.11 MIL/uL   Hemoglobin 11.3 (L) 12.0 - 15.0 g/dL   HCT 36.4 36.0 - 46.0 %   MCV 94.3 80.0 - 100.0 fL   MCH 29.3 26.0 - 34.0 pg   MCHC 31.0 30.0 - 36.0 g/dL   RDW 13.8 11.5 - 15.5 %   Platelets 260 150 - 400 K/uL   nRBC 0.0 0.0 - 0.2 %    Comment: Performed at [facility], [address] [place], [place] [address]  Renal function panel     Status: Abnormal   Collection Time: 04/18/22  9:14 AM  Result Value Ref Range   Sodium 130 (L) 135 - 145 mmol/L   Potassium 4.1 3.5 - 5.1 mmol/L   Chloride 94 (L) 98 - 111 mmol/L   CO2 19 (L) 22 - 32 mmol/L   Glucose, Bld 132 (H) 70 - 99 mg/dL    Comment: Glucose reference range applies only to samples taken after fasting for at least 8 hours.   BUN 62 (H) 6 - 20 mg/dL   Creatinine, Ser 8.82 (H) 0.44 - 1.00 mg/dL   Calcium 8.6 (L) 8.9 - 10.3 mg/dL   Phosphorus 8.5 (H) 2.5 - 4.6 mg/dL   Albumin 3.6 3.5 - 5.0 g/dL   GFR, Estimated 5 (L) >60 mL/min    Comment: (NOTE) Calculated using the CKD-EPI Creatinine Equation (2021)    Anion gap 17 (H) 5 - 15    Comment: Performed at [facility], [address] [place], [place] [address]  Glucose, capillary     Status: Abnormal   Collection Time: 04/18/22  4:01 PM  Result Value Ref Range   Glucose-Capillary 186 (H) 70 - 99 mg/dL    Comment: Glucose reference range applies only to samples taken after fasting for at least 8 hours.  Aerobic Culture w  Gram Stain (superficial specimen)     Status: None   Collection Time: 04/18/22  5:45 PM   Specimen: Cervix, Endocervical  Result Value Ref Range   Specimen Description      ENDOCERVICAL Performed at [facility], [address], [place], [place] [address]    Special Requests      NONE ENDOCERVICAL Performed at [facility], [place], [place] [address]    Gram Stain NO WBC SEEN NO ORGANISMS SEEN     Culture      NO GROWTH 2 DAYS Performed at [facility], [place] [address], [place], [place] [address]    Report Status 04/21/2022 FINAL   Glucose, capillary     Status: Abnormal   Collection Time: 04/18/22  8:00 PM  Result Value Ref Range   Glucose-Capillary 184 (H) 70 - 99 mg/dL    Comment: Glucose reference range applies only to samples taken after fasting for at least 8 hours.  Glucose, capillary     Status: Abnormal   Collection Time: 04/18/22 11:17 PM  Result Value Ref Range   Glucose-Capillary 271 (H) 70 - 99 mg/dL    Comment: Glucose reference range applies only to samples taken after fasting for at least 8 hours.  Glucose, capillary  Status: Abnormal   Collection Time: 04/19/22  3:59 AM  Result Value Ref Range   Glucose-Capillary 164 (H) 70 - 99 mg/dL    Comment: Glucose reference range applies only to samples taken after fasting for at least 8 hours.  Glucose, capillary     Status: Abnormal   Collection Time: 04/19/22  8:29 AM  Result Value Ref Range   Glucose-Capillary 146 (H) 70 - 99 mg/dL    Comment: Glucose reference range applies only to samples taken after fasting for at least 8 hours.  Glucose, capillary     Status: Abnormal   Collection Time: 04/19/22 11:35 AM  Result Value Ref Range   Glucose-Capillary 202 (H) 70 - 99 mg/dL    Comment: Glucose reference range applies only to samples taken after fasting for at least 8 hours.  Glucose, capillary     Status: Abnormal   Collection Time: 04/19/22  3:10 PM  Result Value  Ref Range   Glucose-Capillary 235 (H) 70 - 99 mg/dL    Comment: Glucose reference range applies only to samples taken after fasting for at least 8 hours.  Glucose, capillary     Status: Abnormal   Collection Time: 04/19/22  5:21 PM  Result Value Ref Range   Glucose-Capillary 211 (H) 70 - 99 mg/dL    Comment: Glucose reference range applies only to samples taken after fasting for at least 8 hours.  Glucose, capillary     Status: Abnormal   Collection Time: 04/19/22  7:45 PM  Result Value Ref Range   Glucose-Capillary 214 (H) 70 - 99 mg/dL    Comment: Glucose reference range applies only to samples taken after fasting for at least 8 hours.   Comment 1 Notify RN   Glucose, capillary     Status: Abnormal   Collection Time: 04/19/22 11:21 PM  Result Value Ref Range   Glucose-Capillary 217 (H) 70 - 99 mg/dL    Comment: Glucose reference range applies only to samples taken after fasting for at least 8 hours.  Glucose, capillary     Status: Abnormal   Collection Time: 04/20/22  4:06 AM  Result Value Ref Range   Glucose-Capillary 218 (H) 70 - 99 mg/dL    Comment: Glucose reference range applies only to samples taken after fasting for at least 8 hours.   Comment 1 Notify RN   Vancomycin, random     Status: None   Collection Time: 04/20/22  5:10 AM  Result Value Ref Range   Vancomycin Rm 25 ug/mL    Comment:        Random Vancomycin therapeutic range is dependent on dosage and time of specimen collection. A peak range is 20-40 ug/mL A trough range is 5-15 ug/mL        Performed at [facility], [address] [place], [place] [address]   Glucose, capillary     Status: Abnormal   Collection Time: 04/20/22  8:09 AM  Result Value Ref Range   Glucose-Capillary 170 (H) 70 - 99 mg/dL    Comment: Glucose reference range applies only to samples taken after fasting for at least 8 hours.  CBC     Status: Abnormal   Collection Time: 04/20/22  8:39 AM  Result Value Ref Range   WBC  8.8 4.0 - 10.5 K/uL   RBC 3.80 (L) 3.87 - 5.11 MIL/uL   Hemoglobin 11.1 (L) 12.0 - 15.0 g/dL   HCT 36.8 36.0 - 46.0 %   MCV 96.8  80.0 - 100.0 fL   MCH 29.2 26.0 - 34.0 pg   MCHC 30.2 30.0 - 36.0 g/dL   RDW 13.8 11.5 - 15.5 %   Platelets 227 150 - 400 K/uL   nRBC 0.0 0.0 - 0.2 %    Comment: Performed at [facility], [address] [place], [place] [address]  Renal function panel     Status: Abnormal   Collection Time: 04/20/22  8:39 AM  Result Value Ref Range   Sodium 130 (L) 135 - 145 mmol/L   Potassium 4.6 3.5 - 5.1 mmol/L   Chloride 92 (L) 98 - 111 mmol/L   CO2 21 (L) 22 - 32 mmol/L   Glucose, Bld 195 (H) 70 - 99 mg/dL    Comment: Glucose reference range applies only to samples taken after fasting for at least 8 hours.   BUN 61 (H) 6 - 20 mg/dL   Creatinine, Ser 8.07 (H) 0.44 - 1.00 mg/dL   Calcium 8.9 8.9 - 10.3 mg/dL   Phosphorus 8.2 (H) 2.5 - 4.6 mg/dL   Albumin 3.6 3.5 - 5.0 g/dL   GFR, Estimated 6 (L) >60 mL/min    Comment: (NOTE) Calculated using the CKD-EPI Creatinine Equation (2021)    Anion gap 17 (H) 5 - 15    Comment: Performed at [facility], [address] [place], [place] [address]  Glucose, capillary     Status: Abnormal   Collection Time: 04/20/22  2:18 PM  Result Value Ref Range   Glucose-Capillary 208 (H) 70 - 99 mg/dL    Comment: Glucose reference range applies only to samples taken after fasting for at least 8 hours.      Assessment & Plan:   Problem List Items Addressed This Visit       Cardiovascular and Mediastinum   Chronic diastolic heart failure (HCC) (Chronic)   Acute on chronic diastolic CHF (congestive heart failure) (HCC)     Respiratory   Chronic obstructive pulmonary disease (HCC)   Relevant Medications   predniSONE (DELTASONE) 50 MG tablet   Other Relevant Orders   DG Chest 2 View     Endocrine   Diabetic foot ulcer ([place])     Genitourinary   ESRD on hemodialysis ([place])     Other   Anxiety - Primary  (Chronic)   Relevant Medications   escitalopram (LEXAPRO) 10 MG tablet   doxepin (SINEQUAN) 10 MG capsule   Insomnia   Relevant Medications   doxepin (SINEQUAN) 10 MG capsule   Chronic low back pain   Relevant Medications   escitalopram (LEXAPRO) 10 MG tablet   predniSONE (DELTASONE) 50 MG tablet   doxepin (SINEQUAN) 10 MG capsule   Other Relevant Orders   Ambulatory referral to Pain Clinic    No follow-ups on file.   Total time spent: 35 minutes  Evern Bio, NP  04/24/2022

## 2022-04-25 DIAGNOSIS — N186 End stage renal disease: Secondary | ICD-10-CM | POA: Diagnosis not present

## 2022-04-25 DIAGNOSIS — Z992 Dependence on renal dialysis: Secondary | ICD-10-CM | POA: Diagnosis not present

## 2022-04-25 DIAGNOSIS — M86171 Other acute osteomyelitis, right ankle and foot: Secondary | ICD-10-CM | POA: Diagnosis not present

## 2022-04-26 DIAGNOSIS — I5040 Unspecified combined systolic (congestive) and diastolic (congestive) heart failure: Secondary | ICD-10-CM | POA: Diagnosis not present

## 2022-04-26 DIAGNOSIS — J45909 Unspecified asthma, uncomplicated: Secondary | ICD-10-CM | POA: Diagnosis not present

## 2022-04-27 DIAGNOSIS — E8770 Fluid overload, unspecified: Secondary | ICD-10-CM | POA: Diagnosis not present

## 2022-04-27 DIAGNOSIS — M86171 Other acute osteomyelitis, right ankle and foot: Secondary | ICD-10-CM | POA: Diagnosis not present

## 2022-04-27 DIAGNOSIS — N186 End stage renal disease: Secondary | ICD-10-CM | POA: Diagnosis not present

## 2022-04-27 DIAGNOSIS — Z992 Dependence on renal dialysis: Secondary | ICD-10-CM | POA: Diagnosis not present

## 2022-04-28 DIAGNOSIS — E1122 Type 2 diabetes mellitus with diabetic chronic kidney disease: Secondary | ICD-10-CM | POA: Diagnosis not present

## 2022-04-28 DIAGNOSIS — I5032 Chronic diastolic (congestive) heart failure: Secondary | ICD-10-CM | POA: Diagnosis not present

## 2022-04-28 DIAGNOSIS — I951 Orthostatic hypotension: Secondary | ICD-10-CM | POA: Diagnosis not present

## 2022-04-28 DIAGNOSIS — E1151 Type 2 diabetes mellitus with diabetic peripheral angiopathy without gangrene: Secondary | ICD-10-CM | POA: Diagnosis not present

## 2022-04-28 DIAGNOSIS — I70203 Unspecified atherosclerosis of native arteries of extremities, bilateral legs: Secondary | ICD-10-CM | POA: Diagnosis not present

## 2022-04-28 DIAGNOSIS — J9611 Chronic respiratory failure with hypoxia: Secondary | ICD-10-CM | POA: Diagnosis not present

## 2022-04-28 DIAGNOSIS — D631 Anemia in chronic kidney disease: Secondary | ICD-10-CM | POA: Diagnosis not present

## 2022-04-28 DIAGNOSIS — Z992 Dependence on renal dialysis: Secondary | ICD-10-CM | POA: Diagnosis not present

## 2022-04-28 DIAGNOSIS — E1169 Type 2 diabetes mellitus with other specified complication: Secondary | ICD-10-CM | POA: Diagnosis not present

## 2022-04-28 DIAGNOSIS — Z794 Long term (current) use of insulin: Secondary | ICD-10-CM | POA: Diagnosis not present

## 2022-04-28 DIAGNOSIS — E11621 Type 2 diabetes mellitus with foot ulcer: Secondary | ICD-10-CM | POA: Diagnosis not present

## 2022-04-28 DIAGNOSIS — I132 Hypertensive heart and chronic kidney disease with heart failure and with stage 5 chronic kidney disease, or end stage renal disease: Secondary | ICD-10-CM | POA: Diagnosis not present

## 2022-04-28 DIAGNOSIS — E872 Acidosis, unspecified: Secondary | ICD-10-CM | POA: Diagnosis not present

## 2022-04-28 DIAGNOSIS — J441 Chronic obstructive pulmonary disease with (acute) exacerbation: Secondary | ICD-10-CM | POA: Diagnosis not present

## 2022-04-28 DIAGNOSIS — M86371 Chronic multifocal osteomyelitis, right ankle and foot: Secondary | ICD-10-CM | POA: Diagnosis not present

## 2022-04-28 DIAGNOSIS — Z9981 Dependence on supplemental oxygen: Secondary | ICD-10-CM | POA: Diagnosis not present

## 2022-04-28 DIAGNOSIS — J9612 Chronic respiratory failure with hypercapnia: Secondary | ICD-10-CM | POA: Diagnosis not present

## 2022-04-28 DIAGNOSIS — N186 End stage renal disease: Secondary | ICD-10-CM | POA: Diagnosis not present

## 2022-04-28 DIAGNOSIS — L97513 Non-pressure chronic ulcer of other part of right foot with necrosis of muscle: Secondary | ICD-10-CM | POA: Diagnosis not present

## 2022-04-28 DIAGNOSIS — E1165 Type 2 diabetes mellitus with hyperglycemia: Secondary | ICD-10-CM | POA: Diagnosis not present

## 2022-04-28 DIAGNOSIS — Z7985 Long-term (current) use of injectable non-insulin antidiabetic drugs: Secondary | ICD-10-CM | POA: Diagnosis not present

## 2022-04-29 DIAGNOSIS — N186 End stage renal disease: Secondary | ICD-10-CM | POA: Diagnosis not present

## 2022-04-29 DIAGNOSIS — Z992 Dependence on renal dialysis: Secondary | ICD-10-CM | POA: Diagnosis not present

## 2022-04-29 DIAGNOSIS — E8770 Fluid overload, unspecified: Secondary | ICD-10-CM | POA: Diagnosis not present

## 2022-04-29 DIAGNOSIS — M86171 Other acute osteomyelitis, right ankle and foot: Secondary | ICD-10-CM | POA: Diagnosis not present

## 2022-04-30 DIAGNOSIS — Z5181 Encounter for therapeutic drug level monitoring: Secondary | ICD-10-CM | POA: Diagnosis not present

## 2022-04-30 DIAGNOSIS — Z792 Long term (current) use of antibiotics: Secondary | ICD-10-CM | POA: Diagnosis not present

## 2022-04-30 DIAGNOSIS — N186 End stage renal disease: Secondary | ICD-10-CM | POA: Diagnosis not present

## 2022-04-30 DIAGNOSIS — M869 Osteomyelitis, unspecified: Secondary | ICD-10-CM | POA: Diagnosis not present

## 2022-04-30 DIAGNOSIS — M86171 Other acute osteomyelitis, right ankle and foot: Secondary | ICD-10-CM | POA: Diagnosis not present

## 2022-04-30 DIAGNOSIS — Z992 Dependence on renal dialysis: Secondary | ICD-10-CM | POA: Diagnosis not present

## 2022-04-30 DIAGNOSIS — E8770 Fluid overload, unspecified: Secondary | ICD-10-CM | POA: Diagnosis not present

## 2022-05-01 DIAGNOSIS — Z7985 Long-term (current) use of injectable non-insulin antidiabetic drugs: Secondary | ICD-10-CM | POA: Diagnosis not present

## 2022-05-01 DIAGNOSIS — E1169 Type 2 diabetes mellitus with other specified complication: Secondary | ICD-10-CM | POA: Diagnosis not present

## 2022-05-01 DIAGNOSIS — E872 Acidosis, unspecified: Secondary | ICD-10-CM | POA: Diagnosis not present

## 2022-05-01 DIAGNOSIS — J9612 Chronic respiratory failure with hypercapnia: Secondary | ICD-10-CM | POA: Diagnosis not present

## 2022-05-01 DIAGNOSIS — Z9981 Dependence on supplemental oxygen: Secondary | ICD-10-CM | POA: Diagnosis not present

## 2022-05-01 DIAGNOSIS — M86371 Chronic multifocal osteomyelitis, right ankle and foot: Secondary | ICD-10-CM | POA: Diagnosis not present

## 2022-05-01 DIAGNOSIS — N186 End stage renal disease: Secondary | ICD-10-CM | POA: Diagnosis not present

## 2022-05-01 DIAGNOSIS — E1122 Type 2 diabetes mellitus with diabetic chronic kidney disease: Secondary | ICD-10-CM | POA: Diagnosis not present

## 2022-05-01 DIAGNOSIS — E1151 Type 2 diabetes mellitus with diabetic peripheral angiopathy without gangrene: Secondary | ICD-10-CM | POA: Diagnosis not present

## 2022-05-01 DIAGNOSIS — I5032 Chronic diastolic (congestive) heart failure: Secondary | ICD-10-CM | POA: Diagnosis not present

## 2022-05-01 DIAGNOSIS — J441 Chronic obstructive pulmonary disease with (acute) exacerbation: Secondary | ICD-10-CM | POA: Diagnosis not present

## 2022-05-01 DIAGNOSIS — J9611 Chronic respiratory failure with hypoxia: Secondary | ICD-10-CM | POA: Diagnosis not present

## 2022-05-01 DIAGNOSIS — I132 Hypertensive heart and chronic kidney disease with heart failure and with stage 5 chronic kidney disease, or end stage renal disease: Secondary | ICD-10-CM | POA: Diagnosis not present

## 2022-05-01 DIAGNOSIS — I951 Orthostatic hypotension: Secondary | ICD-10-CM | POA: Diagnosis not present

## 2022-05-01 DIAGNOSIS — D631 Anemia in chronic kidney disease: Secondary | ICD-10-CM | POA: Diagnosis not present

## 2022-05-01 DIAGNOSIS — Z794 Long term (current) use of insulin: Secondary | ICD-10-CM | POA: Diagnosis not present

## 2022-05-01 DIAGNOSIS — L97513 Non-pressure chronic ulcer of other part of right foot with necrosis of muscle: Secondary | ICD-10-CM | POA: Diagnosis not present

## 2022-05-01 DIAGNOSIS — E11621 Type 2 diabetes mellitus with foot ulcer: Secondary | ICD-10-CM | POA: Diagnosis not present

## 2022-05-01 DIAGNOSIS — I70203 Unspecified atherosclerosis of native arteries of extremities, bilateral legs: Secondary | ICD-10-CM | POA: Diagnosis not present

## 2022-05-01 DIAGNOSIS — Z992 Dependence on renal dialysis: Secondary | ICD-10-CM | POA: Diagnosis not present

## 2022-05-01 DIAGNOSIS — E1165 Type 2 diabetes mellitus with hyperglycemia: Secondary | ICD-10-CM | POA: Diagnosis not present

## 2022-05-02 DIAGNOSIS — Z992 Dependence on renal dialysis: Secondary | ICD-10-CM | POA: Diagnosis not present

## 2022-05-02 DIAGNOSIS — M86171 Other acute osteomyelitis, right ankle and foot: Secondary | ICD-10-CM | POA: Diagnosis not present

## 2022-05-02 DIAGNOSIS — E8770 Fluid overload, unspecified: Secondary | ICD-10-CM | POA: Diagnosis not present

## 2022-05-02 DIAGNOSIS — N186 End stage renal disease: Secondary | ICD-10-CM | POA: Diagnosis not present

## 2022-05-03 ENCOUNTER — Encounter: Payer: 59 | Attending: Internal Medicine

## 2022-05-03 DIAGNOSIS — I96 Gangrene, not elsewhere classified: Secondary | ICD-10-CM | POA: Diagnosis not present

## 2022-05-03 DIAGNOSIS — N186 End stage renal disease: Secondary | ICD-10-CM | POA: Insufficient documentation

## 2022-05-03 DIAGNOSIS — I5042 Chronic combined systolic (congestive) and diastolic (congestive) heart failure: Secondary | ICD-10-CM | POA: Insufficient documentation

## 2022-05-03 DIAGNOSIS — L97514 Non-pressure chronic ulcer of other part of right foot with necrosis of bone: Secondary | ICD-10-CM | POA: Diagnosis not present

## 2022-05-03 DIAGNOSIS — E1169 Type 2 diabetes mellitus with other specified complication: Secondary | ICD-10-CM | POA: Diagnosis not present

## 2022-05-03 DIAGNOSIS — M86371 Chronic multifocal osteomyelitis, right ankle and foot: Secondary | ICD-10-CM | POA: Insufficient documentation

## 2022-05-03 DIAGNOSIS — E11621 Type 2 diabetes mellitus with foot ulcer: Secondary | ICD-10-CM | POA: Insufficient documentation

## 2022-05-03 DIAGNOSIS — E1122 Type 2 diabetes mellitus with diabetic chronic kidney disease: Secondary | ICD-10-CM | POA: Diagnosis not present

## 2022-05-03 DIAGNOSIS — E1152 Type 2 diabetes mellitus with diabetic peripheral angiopathy with gangrene: Secondary | ICD-10-CM | POA: Diagnosis not present

## 2022-05-03 DIAGNOSIS — Z9981 Dependence on supplemental oxygen: Secondary | ICD-10-CM | POA: Diagnosis not present

## 2022-05-03 DIAGNOSIS — I132 Hypertensive heart and chronic kidney disease with heart failure and with stage 5 chronic kidney disease, or end stage renal disease: Secondary | ICD-10-CM | POA: Diagnosis not present

## 2022-05-03 DIAGNOSIS — Z992 Dependence on renal dialysis: Secondary | ICD-10-CM | POA: Diagnosis not present

## 2022-05-04 DIAGNOSIS — Z992 Dependence on renal dialysis: Secondary | ICD-10-CM | POA: Diagnosis not present

## 2022-05-04 DIAGNOSIS — E8770 Fluid overload, unspecified: Secondary | ICD-10-CM | POA: Diagnosis not present

## 2022-05-04 DIAGNOSIS — M86171 Other acute osteomyelitis, right ankle and foot: Secondary | ICD-10-CM | POA: Diagnosis not present

## 2022-05-04 DIAGNOSIS — N186 End stage renal disease: Secondary | ICD-10-CM | POA: Diagnosis not present

## 2022-05-04 NOTE — Progress Notes (Signed)
[name], [name] ([id number]) [id number] Page 1 of 5 Visit Report for 05/03/2022 Arrival Information Details Patient Name: Date of Service: [name], [place] [name] M. 05/03/2022 11:15 A M Medical Record Number: [id number] Patient Account Number: [id number] Date of Birth/Sex: Treating RN: 06-23-1970 (52 y.o. [name] Primary Care Jameis Newsham: Evern Bio Other Clinician: Massie Kluver Referring Terri Rorrer: Treating Ayyub Krall/Extender: RO BSO N, MICHA EL [name], [name] in Treatment: 86 Visit Information History Since Last Visit All ordered tests and consults were completed: No Patient Arrived: Wheel Chair Added or deleted any medications: No Arrival Time: 11:17 Any new allergies or adverse reactions: No Transfer Assistance: EasyPivot Patient Lift Had a fall or experienced change in No Patient Requires Transmission-Based Precautions: No activities of daily living that may affect Patient Has Alerts: No risk of falls: Signs or symptoms of abuse/neglect since last visito No Hospitalized since last visit: No Implantable device outside of the clinic excluding No cellular tissue based products placed in the center since last visit: Has Dressing in Place as Prescribed: Yes Pain Present Now: No Electronic Signature(s) Signed: 05/03/2022 3:24:47 PM By: Massie Kluver Entered By: Massie Kluver on 05/03/2022 11:53:43 -------------------------------------------------------------------------------- Clinic Level of Care Assessment Details Patient Name: Date of Service: [place], [place] [name] M. 05/03/2022 11:15 A M Medical Record Number: [id number] Patient Account Number: [id number] Date of Birth/Sex: Treating RN: [birth year] (52 y.o. [name] Primary Care [place] Rabenold: Evern Bio Other Clinician: Massie Kluver Referring Janari Gagner: Treating Christain Niznik/Extender: RO BSO N, [place], [name] in Treatment: 50 Clinic Level of Care Assessment Items TOOL 4  Quantity Score '[]'$  - 0 Use when only an EandM is performed on FOLLOW-UP visit ASSESSMENTS - Nursing Assessment / Reassessment X- 1 10 Reassessment of Co-morbidities (includes updates in patient status) X- 1 5 Reassessment of Adherence to Treatment Plan [place], [name] ([id number]) [id number] Page 2 of 5 ASSESSMENTS - Wound and Skin A ssessment / Reassessment X - Simple Wound Assessment / Reassessment - one wound 1 5 '[]'$  - 0 Complex Wound Assessment / Reassessment - multiple wounds '[]'$  - 0 Dermatologic / Skin Assessment (not related to wound area) ASSESSMENTS - Focused Assessment '[]'$  - 0 Circumferential Edema Measurements - multi extremities '[]'$  - 0 Nutritional Assessment / Counseling / Intervention '[]'$  - 0 Lower Extremity Assessment (monofilament, tuning fork, pulses) '[]'$  - 0 Peripheral Arterial Disease Assessment (using hand held doppler) ASSESSMENTS - Ostomy and/or Continence Assessment and Care '[]'$  - 0 Incontinence Assessment and Management '[]'$  - 0 Ostomy Care Assessment and Management (repouching, etc.) PROCESS - Coordination of Care X - Simple Patient / Family Education for ongoing care 1 15 '[]'$  - 0 Complex (extensive) Patient / Family Education for ongoing care '[]'$  - 0 Staff obtains Programmer, systems, Records, T Results / Process Orders est '[]'$  - 0 Staff telephones HHA, Nursing Homes / Clarify orders / etc '[]'$  - 0 Routine Transfer to another Facility (non-emergent condition) '[]'$  - 0 Routine Hospital Admission (non-emergent condition) '[]'$  - 0 New Admissions / Biomedical engineer / Ordering NPWT Apligraf, etc. , '[]'$  - 0 Emergency Hospital Admission (emergent condition) X- 1 10 Simple Discharge Coordination '[]'$  - 0 Complex (extensive) Discharge Coordination PROCESS - Special Needs '[]'$  - 0 Pediatric / Minor Patient Management '[]'$  - 0 Isolation Patient Management '[]'$  - 0 Hearing / Language / Visual special needs '[]'$  - 0 Assessment of Community assistance  (transportation, D/C planning, etc.) '[]'$  - 0 Additional assistance / Altered mentation '[]'$  - 0 Support Surface(s) Assessment (bed, cushion, seat, etc.)  INTERVENTIONS - Wound Cleansing / Measurement X - Simple Wound Cleansing - one wound 1 5 '[]'$  - 0 Complex Wound Cleansing - multiple wounds X- 1 5 Wound Imaging (photographs - any number of wounds) '[]'$  - 0 Wound Tracing (instead of photographs) X- 1 5 Simple Wound Measurement - one wound '[]'$  - 0 Complex Wound Measurement - multiple wounds INTERVENTIONS - Wound Dressings '[]'$  - 0 Small Wound Dressing one or multiple wounds X- 1 15 Medium Wound Dressing one or multiple wounds '[]'$  - 0 Large Wound Dressing one or multiple wounds X- 1 5 Application of Medications - topical '[]'$  - 0 Application of Medications - injection INTERVENTIONS - Miscellaneous '[]'$  - 0 External ear exam [name], [name] ([id number]) [id number] Page 3 of 5 '[]'$  - 0 Specimen Collection (cultures, biopsies, blood, body fluids, etc.) '[]'$  - 0 Specimen(s) / Culture(s) sent or taken to Lab for analysis '[]'$  - 0 Patient Transfer (multiple staff / Harrel Lemon Lift / Similar devices) '[]'$  - 0 Simple Staple / Suture removal (25 or less) '[]'$  - 0 Complex Staple / Suture removal (26 or more) '[]'$  - 0 Hypo / Hyperglycemic Management (close monitor of Blood Glucose) '[]'$  - 0 Ankle / Brachial Index (ABI) - do not check if billed separately '[]'$  - 0 Vital Signs Has the patient been seen at the hospital within the last three years: Yes Total Score: 80 Level Of Care: New/Established - Level 3 Electronic Signature(s) Signed: 05/03/2022 3:24:47 PM By: Massie Kluver Entered By: Massie Kluver on 05/03/2022 11:55:44 -------------------------------------------------------------------------------- Encounter Discharge Information Details Patient Name: Date of Service: [name], CA [name] M. 05/03/2022 11:15 A M Medical Record Number: [id number] Patient Account Number:  [id number] Date of Birth/Sex: Treating RN: 15-Oct-1970 (52 y.o. [name] Primary Care Saydee Zolman: Evern Bio Other Clinician: Massie Kluver Referring Marcos Ruelas: Treating Khamia Stambaugh/Extender: RO BSO N, MICHA EL [name], [name] in Treatment: 49 Encounter Discharge Information Items Discharge Condition: Stable Ambulatory Status: Wheelchair Discharge Destination: Home Transportation: Private Auto Accompanied By: [name] Schedule Follow-up Appointment: Yes Clinical Summary of Care: Electronic Signature(s) Signed: 05/03/2022 3:24:47 PM By: Massie Kluver Entered By: Massie Kluver on 05/03/2022 11:55:05 Wound Assessment Details -------------------------------------------------------------------------------- [name], [name] ([id number]) [id number] Page 4 of 5 Patient Name: Date of Service: [name], CA [name] M. 05/03/2022 11:15 A M Medical Record Number: [id number] Patient Account Number: [id number] Date of Birth/Sex: Treating RN: [birth year] (52 y.o. [name] Primary Care Eh Sesay: Evern Bio Other Clinician: Massie Kluver Referring Karyssa Amaral: Treating Dondrea Clendenin/Extender: RO BSO N, MICHA EL [name], [name] in Treatment: 50 Wound Status Wound Number: 2 Primary Diabetic Wound/Ulcer of the Lower Extremity Etiology: Wound Location: Right, Circumferential Foot Wound Open Wounding Event: Gradually Appeared Status: Date Acquired: 10/15/2020 Notes: open areas to midline right foot, lateral right foot, and top of right [name] Of Treatment: 50 foot Clustered Wound: No Comorbid Cataracts, Anemia, Asthma, Chronic Obstructive Pulmonary Pending Amputation On Presentation History: Disease (COPD), Congestive Heart Failure, Hypertension, Type II Diabetes, End Stage Renal Disease, Osteoarthritis, Neuropathy Photos Wound Measurements Length: (cm) 13 % Reduction in Area: 34.5% Width: (cm) 6.8 % Reduction in Volume: 93.5% Depth: (cm) 0.2 Area:  (cm) 69.429 Volume: (cm) 13.886 Wound Description Classification: Grade 4 Foul Odor After Cleansing: No Exudate Amount: Medium Slough/Fibrino Yes Exudate Type: Serosanguineous Exudate Color: red, brown Wound Bed Granulation Amount: Medium (34-66%) Exposed Structure Necrotic Amount: Medium (34-66%) Fascia Exposed: No Necrotic Quality: Eschar, Bone Fat Layer (Subcutaneous Tissue) Exposed: Yes Tendon Exposed: No Muscle Exposed: No Joint Exposed: No Bone  Exposed: No Treatment Notes Wound #2 (Foot) Wound Laterality: Right, Circumferential Cleanser Normal Saline Discharge Instruction: Wash your hands with soap and water. Remove old dressing, discard into plastic bag and place into trash. Cleanse the wound with Normal Saline prior to applying a clean dressing using gauze sponges, not tissues or cotton balls. Do not scrub or use excessive force. Pat dry using gauze sponges, not tissue or cotton balls. Soap and Water Discharge Instruction: Gently cleanse wound with antibacterial soap, rinse and pat dry prior to dressing wounds Peri-Wound Care Topical Gentamicin Discharge Instruction: Apply to open area of foot Primary Dressing AquacelAg Advantage Dressing, 4x5 (in/in) Discharge Instruction: Apply to wound as directed [name], [name] ([id number]) [id number] Page 5 of 5 Gauze Discharge Instruction: soak with Betadine and wrap around toes Secondary Dressing ABD Pad 5x9 (in/in) Discharge Instruction: Cover with ABD pad Secured With [place] [employer] T ape ape, 2x2 (in/yd) Kerlix Roll Sterile or Non-Sterile 6-ply 4.5x4 (yd/yd) Discharge Instruction: Apply Kerlix as directed Stretch Net Dressing, Latex-free, Size 5, Small-Head / Shoulder / Thigh Compression Wrap Compression Stockings Add-Ons Electronic Signature(s) Signed: 05/03/2022 3:20:50 PM By: Gretta Cool, BSN, RN, CWS, Kim RN, BSN Signed: 05/03/2022 3:24:47 PM By: Massie Kluver Entered By: Massie Kluver on 05/03/2022 11:54:12

## 2022-05-06 DIAGNOSIS — Z9981 Dependence on supplemental oxygen: Secondary | ICD-10-CM | POA: Diagnosis not present

## 2022-05-06 DIAGNOSIS — E1169 Type 2 diabetes mellitus with other specified complication: Secondary | ICD-10-CM | POA: Diagnosis not present

## 2022-05-06 DIAGNOSIS — I132 Hypertensive heart and chronic kidney disease with heart failure and with stage 5 chronic kidney disease, or end stage renal disease: Secondary | ICD-10-CM | POA: Diagnosis not present

## 2022-05-06 DIAGNOSIS — N186 End stage renal disease: Secondary | ICD-10-CM | POA: Diagnosis not present

## 2022-05-06 DIAGNOSIS — Z992 Dependence on renal dialysis: Secondary | ICD-10-CM | POA: Diagnosis not present

## 2022-05-06 DIAGNOSIS — E1122 Type 2 diabetes mellitus with diabetic chronic kidney disease: Secondary | ICD-10-CM | POA: Diagnosis not present

## 2022-05-06 DIAGNOSIS — I5032 Chronic diastolic (congestive) heart failure: Secondary | ICD-10-CM | POA: Diagnosis not present

## 2022-05-06 DIAGNOSIS — E1151 Type 2 diabetes mellitus with diabetic peripheral angiopathy without gangrene: Secondary | ICD-10-CM | POA: Diagnosis not present

## 2022-05-06 DIAGNOSIS — E11621 Type 2 diabetes mellitus with foot ulcer: Secondary | ICD-10-CM | POA: Diagnosis not present

## 2022-05-06 DIAGNOSIS — E872 Acidosis, unspecified: Secondary | ICD-10-CM | POA: Diagnosis not present

## 2022-05-06 DIAGNOSIS — D631 Anemia in chronic kidney disease: Secondary | ICD-10-CM | POA: Diagnosis not present

## 2022-05-06 DIAGNOSIS — J9611 Chronic respiratory failure with hypoxia: Secondary | ICD-10-CM | POA: Diagnosis not present

## 2022-05-06 DIAGNOSIS — J9612 Chronic respiratory failure with hypercapnia: Secondary | ICD-10-CM | POA: Diagnosis not present

## 2022-05-06 DIAGNOSIS — Z7985 Long-term (current) use of injectable non-insulin antidiabetic drugs: Secondary | ICD-10-CM | POA: Diagnosis not present

## 2022-05-06 DIAGNOSIS — I70203 Unspecified atherosclerosis of native arteries of extremities, bilateral legs: Secondary | ICD-10-CM | POA: Diagnosis not present

## 2022-05-06 DIAGNOSIS — M86371 Chronic multifocal osteomyelitis, right ankle and foot: Secondary | ICD-10-CM | POA: Diagnosis not present

## 2022-05-06 DIAGNOSIS — L97513 Non-pressure chronic ulcer of other part of right foot with necrosis of muscle: Secondary | ICD-10-CM | POA: Diagnosis not present

## 2022-05-06 DIAGNOSIS — J441 Chronic obstructive pulmonary disease with (acute) exacerbation: Secondary | ICD-10-CM | POA: Diagnosis not present

## 2022-05-06 DIAGNOSIS — I951 Orthostatic hypotension: Secondary | ICD-10-CM | POA: Diagnosis not present

## 2022-05-06 DIAGNOSIS — E1165 Type 2 diabetes mellitus with hyperglycemia: Secondary | ICD-10-CM | POA: Diagnosis not present

## 2022-05-06 DIAGNOSIS — Z794 Long term (current) use of insulin: Secondary | ICD-10-CM | POA: Diagnosis not present

## 2022-05-07 ENCOUNTER — Telehealth: Payer: Self-pay

## 2022-05-07 DIAGNOSIS — E8770 Fluid overload, unspecified: Secondary | ICD-10-CM | POA: Diagnosis not present

## 2022-05-07 DIAGNOSIS — Z792 Long term (current) use of antibiotics: Secondary | ICD-10-CM | POA: Diagnosis not present

## 2022-05-07 DIAGNOSIS — M869 Osteomyelitis, unspecified: Secondary | ICD-10-CM | POA: Diagnosis not present

## 2022-05-07 DIAGNOSIS — M86171 Other acute osteomyelitis, right ankle and foot: Secondary | ICD-10-CM | POA: Diagnosis not present

## 2022-05-07 DIAGNOSIS — Z992 Dependence on renal dialysis: Secondary | ICD-10-CM | POA: Diagnosis not present

## 2022-05-07 DIAGNOSIS — Z5181 Encounter for therapeutic drug level monitoring: Secondary | ICD-10-CM | POA: Diagnosis not present

## 2022-05-07 DIAGNOSIS — N186 End stage renal disease: Secondary | ICD-10-CM | POA: Diagnosis not present

## 2022-05-07 NOTE — Telephone Encounter (Signed)
Patient called asking about a pill binder? She states this was supposed to be called into her pharmacy can we send these?

## 2022-05-08 ENCOUNTER — Telehealth: Payer: Self-pay

## 2022-05-08 DIAGNOSIS — Z7985 Long-term (current) use of injectable non-insulin antidiabetic drugs: Secondary | ICD-10-CM | POA: Diagnosis not present

## 2022-05-08 DIAGNOSIS — Z992 Dependence on renal dialysis: Secondary | ICD-10-CM | POA: Diagnosis not present

## 2022-05-08 DIAGNOSIS — E113599 Type 2 diabetes mellitus with proliferative diabetic retinopathy without macular edema, unspecified eye: Secondary | ICD-10-CM | POA: Diagnosis not present

## 2022-05-08 DIAGNOSIS — E11621 Type 2 diabetes mellitus with foot ulcer: Secondary | ICD-10-CM | POA: Diagnosis not present

## 2022-05-08 DIAGNOSIS — Z9981 Dependence on supplemental oxygen: Secondary | ICD-10-CM | POA: Diagnosis not present

## 2022-05-08 DIAGNOSIS — E1169 Type 2 diabetes mellitus with other specified complication: Secondary | ICD-10-CM | POA: Diagnosis not present

## 2022-05-08 DIAGNOSIS — J9611 Chronic respiratory failure with hypoxia: Secondary | ICD-10-CM | POA: Diagnosis not present

## 2022-05-08 DIAGNOSIS — E1165 Type 2 diabetes mellitus with hyperglycemia: Secondary | ICD-10-CM | POA: Diagnosis not present

## 2022-05-08 DIAGNOSIS — E113593 Type 2 diabetes mellitus with proliferative diabetic retinopathy without macular edema, bilateral: Secondary | ICD-10-CM | POA: Diagnosis not present

## 2022-05-08 DIAGNOSIS — Z961 Presence of intraocular lens: Secondary | ICD-10-CM | POA: Diagnosis not present

## 2022-05-08 DIAGNOSIS — M86371 Chronic multifocal osteomyelitis, right ankle and foot: Secondary | ICD-10-CM | POA: Diagnosis not present

## 2022-05-08 DIAGNOSIS — H3343 Traction detachment of retina, bilateral: Secondary | ICD-10-CM | POA: Diagnosis not present

## 2022-05-08 DIAGNOSIS — I5032 Chronic diastolic (congestive) heart failure: Secondary | ICD-10-CM | POA: Diagnosis not present

## 2022-05-08 DIAGNOSIS — I70203 Unspecified atherosclerosis of native arteries of extremities, bilateral legs: Secondary | ICD-10-CM | POA: Diagnosis not present

## 2022-05-08 DIAGNOSIS — I132 Hypertensive heart and chronic kidney disease with heart failure and with stage 5 chronic kidney disease, or end stage renal disease: Secondary | ICD-10-CM | POA: Diagnosis not present

## 2022-05-08 DIAGNOSIS — E1122 Type 2 diabetes mellitus with diabetic chronic kidney disease: Secondary | ICD-10-CM | POA: Diagnosis not present

## 2022-05-08 DIAGNOSIS — E872 Acidosis, unspecified: Secondary | ICD-10-CM | POA: Diagnosis not present

## 2022-05-08 DIAGNOSIS — E1151 Type 2 diabetes mellitus with diabetic peripheral angiopathy without gangrene: Secondary | ICD-10-CM | POA: Diagnosis not present

## 2022-05-08 DIAGNOSIS — L97513 Non-pressure chronic ulcer of other part of right foot with necrosis of muscle: Secondary | ICD-10-CM | POA: Diagnosis not present

## 2022-05-08 DIAGNOSIS — I951 Orthostatic hypotension: Secondary | ICD-10-CM | POA: Diagnosis not present

## 2022-05-08 DIAGNOSIS — J441 Chronic obstructive pulmonary disease with (acute) exacerbation: Secondary | ICD-10-CM | POA: Diagnosis not present

## 2022-05-08 DIAGNOSIS — D631 Anemia in chronic kidney disease: Secondary | ICD-10-CM | POA: Diagnosis not present

## 2022-05-08 DIAGNOSIS — H2511 Age-related nuclear cataract, right eye: Secondary | ICD-10-CM | POA: Diagnosis not present

## 2022-05-08 DIAGNOSIS — J9612 Chronic respiratory failure with hypercapnia: Secondary | ICD-10-CM | POA: Diagnosis not present

## 2022-05-08 DIAGNOSIS — Z794 Long term (current) use of insulin: Secondary | ICD-10-CM | POA: Diagnosis not present

## 2022-05-08 DIAGNOSIS — N186 End stage renal disease: Secondary | ICD-10-CM | POA: Diagnosis not present

## 2022-05-08 NOTE — Progress Notes (Signed)
[name] [name] ([id number]) [id number] Page 1 of 2 Visit Report for 05/03/2022 Physician Orders Details Patient Name: Date of Service: [name] [name] RLA M. 05/03/2022 11:15 A M Medical Record Number: [id number] Patient Account Number: [id number] Date of Birth/Sex: Treating RN: [birth year] (52 y.o. [name] [name] Primary Care Provider: Evern Bio Other Clinician: Massie Kluver Referring Provider: Treating Provider/Extender: RO BSO N, [place] EL [name] [name] [name] [name] in Treatment: 27 Verbal / Phone Orders: No Diagnosis Coding Follow-up Appointments Return Appointment in 3 [name]. Nurse Visit as needed [place] for wound care. May utilize formulary equivalent dressing for wound treatment orders unless otherwise specified. Home Health Nurse may visit PRN to address patients wound care needs. Scheduled days for dressing changes to be completed; exception, patient has scheduled wound care visit that day. **Please direct any NON-WOUND related issues/requests for orders to patient's Primary Care Physician. **If current dressing causes regression in wound condition, may D/C ordered dressing product/s and apply Normal Saline Moist Dressing daily until next [place] or Other MD appointment. **Notify [facility] of regression in wound condition at [phone]. Bathing/ Shower/ Hygiene Clean wound with Normal Saline or wound cleanser. Wash wounds with antibacterial soap and water. May shower; gently cleanse wound with antibacterial soap, rinse and pat dry prior to dressing wounds No tub bath. Off-Loading Open toe surgical shoe Medications-Please add to medication list. ntibiotics - continue Doxycycline P.O. A ntibiotic - gentamycin to open area of foot Topical A Wound Treatment Wound #2 - Foot Wound Laterality: Right, Circumferential Cleanser: Normal Saline 1 x Per Day/30 Days Discharge  Instructions: Wash your hands with soap and water. Remove old dressing, discard into plastic bag and place into trash. Cleanse the wound with Normal Saline prior to applying a clean dressing using gauze sponges, not tissues or cotton balls. Do not scrub or use excessive force. Pat dry using gauze sponges, not tissue or cotton balls. Cleanser: Soap and Water 1 x Per Day/30 Days Discharge Instructions: Gently cleanse wound with antibacterial soap, rinse and pat dry prior to dressing wounds Topical: Gentamicin 1 x Per Day/30 Days Discharge Instructions: Apply to open area of foot Prim Dressing: AquacelAg Advantage Dressing, 4x5 (in/in) (Dispense As Written) 1 x Per Day/30 Days ary Discharge Instructions: Apply to wound as directed Prim Dressing: Gauze 1 x Per Day/30 Days ary Discharge Instructions: soak with Betadine and wrap around toes Secondary Dressing: ABD Pad 5x9 (in/in) 1 x Per Day/30 Days Discharge Instructions: Cover with ABD pad Secured With: Medipore T - 69M Medipore H Soft Cloth Surgical T ape ape, 2x2 (in/yd) 1 x Per Day/30 Days Secured With: [employer] Sterile or Non-Sterile 6-ply 4.5x4 (yd/yd) 1 x Per Day/30 Days [place], [name] [name] ([id number]) [id number] Page 2 of 2 Discharge Instructions: Apply Kerlix as directed Secured With: Stretch Net Dressing, Latex-free, Size 5, Small-Head / Shoulder / Thigh 1 x Per Day/30 Days Electronic Signature(s) Signed: 05/03/2022 3:24:47 PM By: Massie Kluver Signed: 05/08/2022 8:52:27 AM By: Linton Ham MD Entered By: Massie Kluver on 05/03/2022 11:54:41 -------------------------------------------------------------------------------- SuperBill Details Patient Name: Date of Service: [name] [name] RLA M. 05/03/2022 Medical Record Number: [id number] Patient Account Number: [id number] Date of Birth/Sex: Treating RN: May 29, 1970 (52 y.o. 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

## 2022-05-08 NOTE — Telephone Encounter (Signed)
See other task about CAP paperwork

## 2022-05-08 NOTE — Telephone Encounter (Signed)
Spoke with patient husband  he brought paper work and I sent a test portal message to patient to find out about a " pill binder"

## 2022-05-08 NOTE — Telephone Encounter (Signed)
Pt called and left vm requesting a call back regarding CAP paperwork

## 2022-05-08 NOTE — Telephone Encounter (Signed)
Patient was supposed to call back with information on the paperwork and have it faxed since we were unsure about the paperwork itself

## 2022-05-09 ENCOUNTER — Ambulatory Visit: Payer: 59 | Attending: Anesthesiology | Admitting: Anesthesiology

## 2022-05-09 ENCOUNTER — Encounter: Payer: Self-pay | Admitting: Anesthesiology

## 2022-05-09 VITALS — BP 104/63 | HR 109 | Temp 98.6°F | Resp 22 | Ht 64.0 in | Wt 268.0 lb

## 2022-05-09 DIAGNOSIS — N186 End stage renal disease: Secondary | ICD-10-CM | POA: Diagnosis not present

## 2022-05-09 DIAGNOSIS — M86171 Other acute osteomyelitis, right ankle and foot: Secondary | ICD-10-CM | POA: Diagnosis not present

## 2022-05-09 DIAGNOSIS — M47816 Spondylosis without myelopathy or radiculopathy, lumbar region: Secondary | ICD-10-CM

## 2022-05-09 DIAGNOSIS — M5441 Lumbago with sciatica, right side: Secondary | ICD-10-CM | POA: Diagnosis not present

## 2022-05-09 DIAGNOSIS — G8929 Other chronic pain: Secondary | ICD-10-CM

## 2022-05-09 DIAGNOSIS — E8770 Fluid overload, unspecified: Secondary | ICD-10-CM | POA: Diagnosis not present

## 2022-05-09 DIAGNOSIS — M48061 Spinal stenosis, lumbar region without neurogenic claudication: Secondary | ICD-10-CM

## 2022-05-09 DIAGNOSIS — G894 Chronic pain syndrome: Secondary | ICD-10-CM | POA: Diagnosis not present

## 2022-05-09 DIAGNOSIS — M62830 Muscle spasm of back: Secondary | ICD-10-CM | POA: Diagnosis not present

## 2022-05-09 DIAGNOSIS — Z992 Dependence on renal dialysis: Secondary | ICD-10-CM | POA: Diagnosis not present

## 2022-05-09 MED ORDER — TRAMADOL HCL 50 MG PO TABS: 100.0000 mg | ORAL_TABLET | Freq: Three times a day (TID) | ORAL | 1 refills | Status: AC

## 2022-05-09 NOTE — Progress Notes (Signed)
Safety precautions to be maintained throughout the outpatient stay will include: orient to surroundings, keep bed in low position, maintain call bell within reach at all times, provide assistance with transfer out of bed and ambulation.  

## 2022-05-09 NOTE — Patient Instructions (Addendum)
Preparing for your procedure (without sedation) Instructions: Oral Intake: Do not eat or drink anything for at least 3 hours prior to your procedure. Transportation: Unless otherwise stated by your physician, you may drive yourself after the procedure. Blood Pressure Medicine: Take your blood pressure medicine with a sip of water the morning of the procedure. Insulin: Take only  of your normal insulin dose. Preventing infections: Shower with an antibacterial soap the morning of your procedure. Build-up your immune system: Take 1000 mg of Vitamin C with every meal (3 times a day) the day prior to your procedure. Pregnancy: If you are pregnant, call and cancel the procedure. Sickness: If you have a cold, fever, or any active infections, call and cancel the procedure. Arrival: You must be in the facility at least 30 minutes prior to your scheduled procedure. Children: Do not bring any children with you. Dress appropriately: Bring dark clothing that you would not mind if they get stained. Valuables: Do not bring any jewelry or valuables. Procedure appointments are reserved for interventional treatments only. No Prescription Refills. No medication changes will be discussed during procedure appointments. No disability issues will be discussed. Trigger Point Injections Patient Information  Description: Trigger points are areas of muscle sensitive to touch which cause pain with movement, sometimes felt some distance from the site of palpation.  Usually the muscle containing these trigger points if felt as a tight band or knot.   The area of maximum tenderness or trigger point is identified, and after antiseptic preparation of the skin, a small needle is placed into this site.  Reproduction of the pain often occurs and numbing medicine (local anesthetic) is injected into the site, sometimes along with steroid preparation.  The entire block usually lasts less than 5 minutes.  Conditions which may be  treated by trigger points:  Muscular pain and spasm Nerve irritation  Preparation for the injection:  Do not eat any solid food or dairy products within 8 hours of your appointment. You may drink clear liquids up to 3 hours before appointment.  Clear liquids include water, black coffee, juice or soda.  No milk or cream please. You may take your regular medications, including pain medications, with a sip of water before your appointment.  Diabetics should hold regular insulin ( if take separately) and take 1/2 normal NPH dose the morning of the procedure.  Carry some sugar containing items with you to your appointment. A driver must accompany you and be prepared to drive you home after your procedure.  Bring all your current medications with you. An IV may be inserted and sedation may be given at the discretion of the physician.  A blood pressure cuff, EKG, and other monitors will often be applied during the procedure.  Some patients may need to have extra oxygen administered for a short period. You will be asked to provide medical information, including your allergies and medications, prior to the procedure.  We must know immediately if you are taking blood thinners (like Coumadin/Warfarin) or if you are allergic to IV iodine contrast (dye).  We must know if you could possibly be pregnant.  Possible side-effects:  Bleeding from needle site Infection (rare, may require surgery) Nerve injury (rare) Numbness & tingling (temporary) Punctured lung (if injection around chest) Light-headedness (temporary) Pain at injection site (several days) Decreased blood pressure (rare, temporary) Weakness in arm/leg (temporary)  Call if you experience:  Hive or difficulty breathing (go to the emergency room) Inflammation or drainage at the injection  site(s)  Please note:  Although the local anesthetic injected can often make your painful muscle feel good for several hours after the injection, the  pain may return.  It takes 3-7 days for steroids to work.  You may not notice any pain relief for at least one week.  If effective, we will often do a series of injections spaced 3-6 weeks apart to maximally decrease your pain.  If you have any questions please call [phone] [facility]

## 2022-05-10 ENCOUNTER — Encounter: Payer: Self-pay | Admitting: Anesthesiology

## 2022-05-10 DIAGNOSIS — M86371 Chronic multifocal osteomyelitis, right ankle and foot: Secondary | ICD-10-CM | POA: Diagnosis not present

## 2022-05-10 DIAGNOSIS — I951 Orthostatic hypotension: Secondary | ICD-10-CM | POA: Diagnosis not present

## 2022-05-10 DIAGNOSIS — E1165 Type 2 diabetes mellitus with hyperglycemia: Secondary | ICD-10-CM | POA: Diagnosis not present

## 2022-05-10 DIAGNOSIS — E11621 Type 2 diabetes mellitus with foot ulcer: Secondary | ICD-10-CM | POA: Diagnosis not present

## 2022-05-10 DIAGNOSIS — E1151 Type 2 diabetes mellitus with diabetic peripheral angiopathy without gangrene: Secondary | ICD-10-CM | POA: Diagnosis not present

## 2022-05-10 DIAGNOSIS — Z992 Dependence on renal dialysis: Secondary | ICD-10-CM | POA: Diagnosis not present

## 2022-05-10 DIAGNOSIS — Z794 Long term (current) use of insulin: Secondary | ICD-10-CM | POA: Diagnosis not present

## 2022-05-10 DIAGNOSIS — I5032 Chronic diastolic (congestive) heart failure: Secondary | ICD-10-CM | POA: Diagnosis not present

## 2022-05-10 DIAGNOSIS — J9612 Chronic respiratory failure with hypercapnia: Secondary | ICD-10-CM | POA: Diagnosis not present

## 2022-05-10 DIAGNOSIS — L97513 Non-pressure chronic ulcer of other part of right foot with necrosis of muscle: Secondary | ICD-10-CM | POA: Diagnosis not present

## 2022-05-10 DIAGNOSIS — I70203 Unspecified atherosclerosis of native arteries of extremities, bilateral legs: Secondary | ICD-10-CM | POA: Diagnosis not present

## 2022-05-10 DIAGNOSIS — J9611 Chronic respiratory failure with hypoxia: Secondary | ICD-10-CM | POA: Diagnosis not present

## 2022-05-10 DIAGNOSIS — Z7985 Long-term (current) use of injectable non-insulin antidiabetic drugs: Secondary | ICD-10-CM | POA: Diagnosis not present

## 2022-05-10 DIAGNOSIS — E1122 Type 2 diabetes mellitus with diabetic chronic kidney disease: Secondary | ICD-10-CM | POA: Diagnosis not present

## 2022-05-10 DIAGNOSIS — D631 Anemia in chronic kidney disease: Secondary | ICD-10-CM | POA: Diagnosis not present

## 2022-05-10 DIAGNOSIS — Z9981 Dependence on supplemental oxygen: Secondary | ICD-10-CM | POA: Diagnosis not present

## 2022-05-10 DIAGNOSIS — J441 Chronic obstructive pulmonary disease with (acute) exacerbation: Secondary | ICD-10-CM | POA: Diagnosis not present

## 2022-05-10 DIAGNOSIS — E872 Acidosis, unspecified: Secondary | ICD-10-CM | POA: Diagnosis not present

## 2022-05-10 DIAGNOSIS — N186 End stage renal disease: Secondary | ICD-10-CM | POA: Diagnosis not present

## 2022-05-10 DIAGNOSIS — E1169 Type 2 diabetes mellitus with other specified complication: Secondary | ICD-10-CM | POA: Diagnosis not present

## 2022-05-10 DIAGNOSIS — I132 Hypertensive heart and chronic kidney disease with heart failure and with stage 5 chronic kidney disease, or end stage renal disease: Secondary | ICD-10-CM | POA: Diagnosis not present

## 2022-05-10 NOTE — Progress Notes (Signed)
Subjective:  Patient ID: [name], female    DOB: Feb 12, 1971  Age: 52 y.o. MRN: [id number]  CC: Back Pain, Shoulder Pain, and Neck Pain     PROCEDURE: None  HPI [name] presents for new patient evaluation.  Currently was seen few years ago and continues to have problems with her chronic low back pain.  She is having more sciatica symptoms and pain into the calves on both sides.  Unfortunately she is very limited in her activity and frequently is in a wheelchair secondary to deconditioning.  She also has a complicated medical history secondary to an ongoing infection in her right foot which is being treated with IV vancomycin.  Additionally, she has a history of O2 dependency secondary to chronic pulmonary problems.  She reports that the pain she has had in her low back is been stable in nature but she feels more spasming in the lower back as of recent.  She is try to do stretching exercises but is very limited in her ability to do physical therapy activity secondary to deconditioning and the above medical problems.  In the past she has been on tramadol with some success but frequently breaks through this and has taken muscle relaxants with limited success.  She is gone through physical therapy and she reports that this was of limited benefit as well.  No changes in lower extremity strength or function are noted but she has chronic deconditioning and is very restricted in her capacity for activity.  History Danis has a past medical history of (HFpEF) heart failure with preserved ejection fraction (HCC), Anxiety, Asthma, Bronchitis, CHF (congestive heart failure) ([place]), Chronic cough, Chronic respiratory failure with hypoxia and hypercapnia ([place]), CKD (chronic kidney disease), stage III ([place]), Depression, Diabetes mellitus without complication ([place]), Diverticulosis, Dyspnea, Dysrhythmia, Environmental and seasonal allergies, Extreme obesity with alveolar hypoventilation ([place]),  Hypertension, Iron deficiency anemia, Kidney failure, Orthopnea, Oxygen dependent, Pericardial effusion, Pneumonia, Poorly controlled diabetes mellitus ([place]), and Swelling of both lower extremities.   She has a past surgical history that includes Cesarean section; DIALYSIS/PERMA CATHETER INSERTION (Right, 04/04/2017); Insertion of dialysis catheter (N/A, 04/16/2017); AV fistula placement (Left, 04/25/2017); A/V Fistulagram (Left, 06/09/2017); A/V Fistulagram (Left, 06/16/2017); DIALYSIS/PERMA CATHETER REMOVAL (N/A, 09/17/2017); Cataract extraction w/PHACO (Left, 01/21/2018); A/V Fistulagram (Left, 01/28/2018); Lower Extremity Angiography (Right, 10/18/2020); Amputation toe (Right, 10/21/2020); Incision and drainage (Right, 10/26/2020); Bone biopsy (Right, 04/27/2021); and Lower Extremity Angiography (Right, 04/17/2022).   Her family history includes Breast cancer (age of onset: 30) in her maternal aunt and maternal aunt; Diabetes in her father, mother, and another family member; Hypertension in her father, mother, and another family member.She reports that she has never smoked. She has been exposed to tobacco smoke. She has never used smokeless tobacco. She reports that she does not currently use alcohol. She reports current drug use. Drug: Marijuana.  No valid procedures specified.  No results found for: "TOXASSSELUR"  Outpatient Medications Prior to Visit  Medication Sig Dispense Refill   acetaminophen (TYLENOL) 500 MG tablet Take 1,000 mg by mouth every 6 (six) hours as needed for mild pain, fever or headache.     albuterol (PROVENTIL) (2.5 MG/3ML) 0.083% nebulizer solution Take 3 mLs (2.5 mg total) by nebulization every 6 (six) hours as needed for wheezing or shortness of breath. 75 mL 11   albuterol (VENTOLIN HFA) 108 (90 Base) MCG/ACT inhaler Inhale 2 puffs into the lungs every 6 (six) hours as needed for wheezing or shortness of breath. [place]  g 2   aspirin EC 81 MG tablet Take 81 mg by mouth daily.       atorvastatin (LIPITOR) 40 MG tablet Take 1 tablet (40 mg total) by mouth daily. 90 tablet 3   budesonide (PULMICORT) 0.5 MG/2ML nebulizer solution Take by nebulization.     ceFEPime (MAXIPIME) IVPB Inject 2 g into the vein Every Tuesday,Thursday,and Saturday with dialysis. Cefepime 2gm IV qHD on Tue, Thur, Sat Indication: Chronic right foot infection Last Day of Therapy: 05/28/2022 Labs - weekly:  CBC/D, ESR, CMP.  Check weekly pre-hemodialysis vancomycin level (or as per protocol) Fax weekly lab results  promptly to (336) [phone] To be given at Hemodialysis center 17 Units 0   Cholecalciferol 100 MCG (4000 UT) CAPS Take 1 capsule by mouth once a week.     clindamycin (CLEOCIN T) 1 % lotion Apply 1 application topically daily.     doxepin (SINEQUAN) 10 MG capsule Take 1 capsule (10 mg total) by mouth at bedtime. 30 capsule 0   escitalopram (LEXAPRO) 10 MG tablet Take 1 tablet (10 mg total) by mouth daily. 30 tablet 2   ferric citrate (AURYXIA) 1 GM 210 MG(Fe) tablet Take 210 mg by mouth 3 (three) times daily with meals. Only taking once a day most days     formoterol (PERFOROMIST) 20 MCG/2ML nebulizer solution      insulin degludec (TRESIBA) 100 UNIT/ML FlexTouch Pen Inject 20 Units into the skin daily.     insulin lispro (HUMALOG) 100 UNIT/ML KwikPen Inject into the skin.     LINZESS 290 MCG CAPS capsule Take 290 mcg by mouth every morning.     midodrine (PROAMATINE) 10 MG tablet Take 20 mg by mouth daily as needed.     montelukast (SINGULAIR) 10 MG tablet Take 1 tablet (10 mg total) by mouth at bedtime. 30 tablet 3   OXYGEN Inhale 2-4 L into the lungs.     predniSONE (DELTASONE) 50 MG tablet Take 1 tablet by mouth daily in AM with food x 5 days 5 tablet 0   pregabalin (LYRICA) 50 MG capsule Take 100 mg by mouth every 8 (eight) hours as needed.     RENVELA 2.4 g PACK Take 2.4 g by mouth 3 (three) times daily.     revefenacin (YUPELRI) 175 MCG/3ML nebulizer solution      vancomycin IVPB Inject  1,000 mg into the vein Every Tuesday,Thursday,and Saturday with dialysis. Vancomycin 1gm IV qHD on Tue,Thur, Sat  Indication: Chronic right foot infection Last Day of Therapy: 05/28/2022 Labs - weekly:  CBC/D, ESR, CMP.  Check weekly pre-hemodialysis vancomycin level (or as per protocol) Fax weekly lab results  promptly to (336) [phone] To be given at Hemodialysis center 17 Units 0   lactulose (CHRONULAC) 10 GM/15ML solution Take 30 mLs (20 g total) by mouth daily as needed for severe constipation. (Patient not taking: Reported on 04/24/2022) 240 mL 0   lidocaine-prilocaine (EMLA) cream Apply topically as needed (For access for dialysis). (Patient not taking: Reported on 04/24/2022)     Omeprazole Magnesium (PRILOSEC PO)  (Patient not taking: Reported on 04/24/2022)     No facility-administered medications prior to visit.   Lab Results  Component Value Date   WBC 8.8 04/20/2022   HGB 11.1 (L) 04/20/2022   HCT 36.8 04/20/2022   PLT 227 04/20/2022   GLUCOSE 195 (H) 04/20/2022   CHOL 232 (H) 01/03/2015   TRIG 151 (H) 04/21/2021   HDL 41 01/03/2015   LDLCALC 169 (H)  01/03/2015   ALT 15 04/17/2022   AST 18 04/17/2022   NA 130 (L) 04/20/2022   K 4.6 04/20/2022   CL 92 (L) 04/20/2022   CREATININE 8.07 (H) 04/20/2022   BUN 61 (H) 04/20/2022   CO2 21 (L) 04/20/2022   TSH 2.831 11/08/2016   INR 1.2 04/15/2022   HGBA1C 7.3 (H) 04/16/2022    --------------------------------------------------------------------------------------------------------------------- DG Chest 2 View  Result Date: 04/26/2022 CLINICAL DATA:  Dyspnea EXAM: CHEST - 2 VIEW COMPARISON:  04/15/2022 FINDINGS: Stable cardiomegaly. Pulmonary vascular congestion. Prominent interstitial markings in the perihilar and bibasilar regions, similar to prior. Chronic elevation of the right hemidiaphragm. No pleural effusion or pneumothorax. IMPRESSION: Cardiomegaly with pulmonary vascular congestion and mild interstitial edema. Appearance  is similar to prior. Electronically Signed   By: Davina Poke D.O.   On: 04/26/2022 13:04       ---------------------------------------------------------------------------------------------------------------------- Past Medical History:  Diagnosis Date   (HFpEF) heart failure with preserved ejection fraction ([place])    a. 11/2014 Echo: EF 50-55%, Gr1 DD; b. 06/2016 Echo: EF 60-65%, no rwma, mod dil LA, nl RV, triv eff.   Anxiety    Asthma    Bronchitis    CHF (congestive heart failure) (HCC)    Chronic cough    Chronic respiratory failure with hypoxia and hypercapnia ([place])    a. Home O2.  (2-4L)   CKD (chronic kidney disease), stage III (HCC)    Depression    Diabetes mellitus without complication (HCC)    Diverticulosis    Dyspnea    Dysrhythmia    per pt, no arrythmias   Environmental and seasonal allergies    Extreme obesity with alveolar hypoventilation (HCC)    Hypertension    Iron deficiency anemia    a. chronic blood loss->heavy menses.   Kidney failure    dialysis tues/thur/sat   ... left arm shunt   Orthopnea    Oxygen dependent    3l/min   Pericardial effusion    a. 11/2014 CT Chest: Mod effusion; b. 11/2014 Echo: small to mod circumferential effusion. No hemodynamic compromise.   Pneumonia    history   Poorly controlled diabetes mellitus ([place])    a. 11/2014 A1c 13.2.   Swelling of both lower extremities     Past Surgical History:  Procedure Laterality Date   A/V FISTULAGRAM Left 06/09/2017   Procedure: A/V FISTULAGRAM;  Surgeon: Algernon Huxley, MD;  Location: [facility];  Service: Cardiovascular;  Laterality: Left;   A/V FISTULAGRAM Left 06/16/2017   Procedure: A/V FISTULAGRAM;  Surgeon: Algernon Huxley, MD;  Location: [facility];  Service: Cardiovascular;  Laterality: Left;   A/V FISTULAGRAM Left 01/28/2018   Procedure: A/V FISTULAGRAM;  Surgeon: Algernon Huxley, MD;  Location: [facility];  Service: Cardiovascular;  Laterality: Left;    AMPUTATION TOE Right 10/21/2020   Procedure: AMPUTATION TOE-5th Digit Amputation Right Foot;  Surgeon: Edrick Kins, DPM;  Location: [facility];  Service: Podiatry;  Laterality: Right;   AV FISTULA PLACEMENT Left 04/25/2017   Procedure: ARTERIOVENOUS (AV) FISTULA CREATION;  Surgeon: Algernon Huxley, MD;  Location: [facility];  Service: Vascular;  Laterality: Left;   BONE BIOPSY Right 04/27/2021   Procedure: Right 4th metatarsal BONE BIOPSY;  Surgeon: [name], DPM;  Location: [facility];  Service: Podiatry;  Laterality: Right;   CATARACT EXTRACTION W/PHACO Left 01/21/2018   Procedure: CATARACT EXTRACTION PHACO AND INTRAOCULAR LENS PLACEMENT (IOC);  Surgeon: Leandrew Koyanagi, MD;  Location: [facility];  Service: Ophthalmology;  Laterality: Left;  [place] 01:55.5 CDE 9.05 EAUP 13.9 Fluid Pack Lot # [id number]   CESAREAN SECTION     times 3   DIALYSIS/PERMA CATHETER INSERTION Right 04/04/2017   Procedure: DIALYSIS/PERMA CATHETER INSERTION;  Surgeon: Katha Cabal, MD;  Location: [facility];  Service: Cardiovascular;  Laterality: Right;   DIALYSIS/PERMA CATHETER REMOVAL N/A 09/17/2017   Procedure: DIALYSIS/PERMA CATHETER REMOVAL;  Surgeon: Algernon Huxley, MD;  Location: [facility];  Service: Cardiovascular;  Laterality: N/A;   INCISION AND DRAINAGE Right 10/26/2020   Procedure: INCISION AND DRAINAGE;  Surgeon: [name], DPM;  Location: [facility];  Service: Podiatry;  Laterality: Right;   INSERTION OF DIALYSIS CATHETER N/A 04/16/2017   Procedure: INSERTION OF DIALYSIS PERM CATHETER;  Surgeon: Algernon Huxley, MD;  Location: [facility];  Service: Vascular;  Laterality: N/A;   LOWER EXTREMITY ANGIOGRAPHY Right 10/18/2020   Procedure: Lower Extremity Angiography;  Surgeon: Algernon Huxley, MD;  Location: [facility];  Service: Cardiovascular;  Laterality: Right;   LOWER EXTREMITY ANGIOGRAPHY Right 04/17/2022   Procedure: Lower Extremity Angiography;  Surgeon: Algernon Huxley, MD;  Location:  [facility];  Service: Cardiovascular;  Laterality: Right;    Family History  Problem Relation Age of Onset   Diabetes Mother    Hypertension Mother    Hypertension Father    Diabetes Father    Hypertension Other    Diabetes Other    Breast cancer Maternal Aunt 10   Breast cancer Maternal Aunt 1    Social History   Tobacco Use   Smoking status: Never    Passive exposure: Past   Smokeless tobacco: Never  Substance Use Topics   Alcohol use: Not Currently    Comment: occasional    ---------------------------------------------------------------------------------------------------------------------  Scheduled Meds: Continuous Infusions: PRN Meds:.   BP 104/63   Pulse (!) 109   Temp 98.6 F (37 C)   Resp (!) 22   Ht 5\' 4"  (1.626 m)   Wt 268 lb (121.6 kg)   LMP 10/14/2012 (Approximate)   SpO2 95%   BMI 46.00 kg/m    BP Readings from Last 3 Encounters:  05/09/22 104/63  04/24/22 112/64  04/20/22 (!) 94/55     Wt Readings from Last 3 Encounters:  05/09/22 268 lb (121.6 kg)  04/24/22 (!) 502 lb 10.4 oz (228 kg)  04/20/22 271 lb 9.7 oz (123.2 kg)     ----------------------------------------------------------------------------------------------------------------------  ROS Review of Systems Heart: No angina or palpitations Pulmonary: She is chronically short of breath with dyspnea on exertion and difficulty laying flat in bed for prolonged periods of time however she reports that this has been stable.  She requires oxygen therapy chronically. GI: No constipation or obstipation or diarrhea Musculoskeletal: As above with chronic low back pain and spasming in the low back with radiation to the hips buttocks occasionally down the legs with an ongoing infection of the right foot  Objective:  BP 104/63   Pulse (!) 109   Temp 98.6 F (37 C)   Resp (!) 22   Ht 5\' 4"  (1.626 m)   Wt 268 lb (121.6 kg)   LMP 10/14/2012 (Approximate)   SpO2 95%   BMI  46.00 kg/m   Physical Exam Patient is alert oriented very friendly cooperative and a good historian. Pupils are equally round reactive to light extraocular muscles intact Heart distant cardiac sounds but no audible murmur rub or  gallop Pulmonary: Distant breath sounds with rhonchi bilaterally and slight wheeze on the left side Abdominal: Deferred Musculoskeletal: She has some tenderness with 2 trigger points noted approximately L5 level 3 cm from the midline and left and right.  She is in a wheelchair and lower extremity strength testing was not performed.  She has a wrap around the right foot secondary to an ongoing infection followed by her primary care team.     Assessment & Plan:   Crystine was seen today for back pain, shoulder pain and neck pain.  Diagnoses and all orders for this visit:  Chronic right-sided low back pain with right-sided sciatica -     INJECT TRIGGER POINT, 1 OR 2  Chronic pain syndrome  Facet arthropathy, lumbar  Spinal stenosis of lumbar region without neurogenic claudication  Muscle spasm of back -     INJECT TRIGGER POINT, 1 OR 2  Other orders -     traMADol (ULTRAM) 50 MG tablet; Take 2 tablets (100 mg total) by mouth 3 (three) times daily.     ----------------------------------------------------------------------------------------------------------------------  Problem List Items Addressed This Visit       Unprioritized   Chronic low back pain - Primary   Relevant Medications   traMADol (ULTRAM) 50 MG tablet   Other Relevant Orders   INJECT TRIGGER POINT, 1 OR 2   Chronic pain syndrome   Relevant Medications   traMADol (ULTRAM) 50 MG tablet   Other Visit Diagnoses     Facet arthropathy, lumbar       Spinal stenosis of lumbar region without neurogenic claudication       Muscle spasm of back       Relevant Orders   INJECT TRIGGER POINT, 1 OR 2        ----------------------------------------------------------------------------------------------------------------------  1. Chronic right-sided low back pain with right-sided sciatica Unfortunately Glendell Docker has a very difficult situation with chronic ongoing pain.  She was seen 4 years ago and has had similar symptoms since that time.  She has been on tramadol in the past with no side effects reported and this did give her some relief as compared to her baseline at present.  We have talked about chronic opioid therapy and she is not a candidate for that secondary to her pulmonary complications and other ongoing baseline medical problems.  Ultimately she would be a candidate for an epidural steroid injection but not under the current circumstances with the ongoing infection that is being treated.  We talked about the risks and benefits of this.  I think a trigger point injection would be beneficial secondary to the spasming in the in the low back and her current symptom complaints.  Will try to set this up in the near future.   - INJECT TRIGGER POINT, 1 OR 2  2. Chronic pain syndrome In regards to her chronic pain I am going to refill her tramadol which I think is reasonable being that she is quite miserable and seeking relief.  She does not sleep well at night and has severe pain throughout the day.  She is tried Tylenol with limited success and cannot do physical therapy as it did not benefit her in the past and she does not feel that a repeat exposure would be of benefit.  She tries to do stretching exercises but has limited capacity.  I will refill her tramadol.  3. Facet arthropathy, lumbar Continue with core stretching as best possible  4. Spinal stenosis of lumbar region without neurogenic claudication  As above  5. Muscle spasm of back And we will schedule her for a trigger point injection in the near future.  Continue follow-up with her primary care physicians.   - INJECT TRIGGER POINT, 1 OR  2    ----------------------------------------------------------------------------------------------------------------------  I am having Aneita M. Brahmbhatt start on traMADol. I am also having her maintain her acetaminophen, aspirin EC, Cholecalciferol, lactulose, ferric citrate, lidocaine-prilocaine, OXYGEN, Linzess, clindamycin, Yupelri, formoterol, budesonide, insulin lispro, pregabalin, Renvela, midodrine, montelukast, Omeprazole Magnesium (PRILOSEC PO), albuterol, albuterol, vancomycin, ceFEPime, atorvastatin, insulin degludec, escitalopram, predniSONE, and doxepin.   Meds ordered this encounter  Medications   traMADol (ULTRAM) 50 MG tablet    Sig: Take 2 tablets (100 mg total) by mouth 3 (three) times daily.    Dispense:  180 tablet    Refill:  1       Follow-up: No follow-ups on file.    Molli Barrows, MD 8:07 AM  The [place] practitioner database for opioid medications on this patient has been reviewed by me and my staff   Greater than 50% of the total encounter time was spent in counseling and / or coordination of care.     This dictation was performed utilizing Systems analyst.  Please excuse any unintentional or mistaken typographical errors as a result.

## 2022-05-11 DIAGNOSIS — M86171 Other acute osteomyelitis, right ankle and foot: Secondary | ICD-10-CM | POA: Diagnosis not present

## 2022-05-11 DIAGNOSIS — N186 End stage renal disease: Secondary | ICD-10-CM | POA: Diagnosis not present

## 2022-05-11 DIAGNOSIS — Z992 Dependence on renal dialysis: Secondary | ICD-10-CM | POA: Diagnosis not present

## 2022-05-11 DIAGNOSIS — E8770 Fluid overload, unspecified: Secondary | ICD-10-CM | POA: Diagnosis not present

## 2022-05-13 DIAGNOSIS — Z992 Dependence on renal dialysis: Secondary | ICD-10-CM | POA: Diagnosis not present

## 2022-05-13 DIAGNOSIS — I5032 Chronic diastolic (congestive) heart failure: Secondary | ICD-10-CM | POA: Diagnosis not present

## 2022-05-13 DIAGNOSIS — E11621 Type 2 diabetes mellitus with foot ulcer: Secondary | ICD-10-CM | POA: Diagnosis not present

## 2022-05-13 DIAGNOSIS — L97513 Non-pressure chronic ulcer of other part of right foot with necrosis of muscle: Secondary | ICD-10-CM | POA: Diagnosis not present

## 2022-05-13 DIAGNOSIS — E1122 Type 2 diabetes mellitus with diabetic chronic kidney disease: Secondary | ICD-10-CM | POA: Diagnosis not present

## 2022-05-13 DIAGNOSIS — J441 Chronic obstructive pulmonary disease with (acute) exacerbation: Secondary | ICD-10-CM | POA: Diagnosis not present

## 2022-05-13 DIAGNOSIS — I132 Hypertensive heart and chronic kidney disease with heart failure and with stage 5 chronic kidney disease, or end stage renal disease: Secondary | ICD-10-CM | POA: Diagnosis not present

## 2022-05-13 DIAGNOSIS — E1151 Type 2 diabetes mellitus with diabetic peripheral angiopathy without gangrene: Secondary | ICD-10-CM | POA: Diagnosis not present

## 2022-05-13 DIAGNOSIS — I951 Orthostatic hypotension: Secondary | ICD-10-CM | POA: Diagnosis not present

## 2022-05-13 DIAGNOSIS — E1165 Type 2 diabetes mellitus with hyperglycemia: Secondary | ICD-10-CM | POA: Diagnosis not present

## 2022-05-13 DIAGNOSIS — J9611 Chronic respiratory failure with hypoxia: Secondary | ICD-10-CM | POA: Diagnosis not present

## 2022-05-13 DIAGNOSIS — E872 Acidosis, unspecified: Secondary | ICD-10-CM | POA: Diagnosis not present

## 2022-05-13 DIAGNOSIS — Z794 Long term (current) use of insulin: Secondary | ICD-10-CM | POA: Diagnosis not present

## 2022-05-13 DIAGNOSIS — D631 Anemia in chronic kidney disease: Secondary | ICD-10-CM | POA: Diagnosis not present

## 2022-05-13 DIAGNOSIS — I70203 Unspecified atherosclerosis of native arteries of extremities, bilateral legs: Secondary | ICD-10-CM | POA: Diagnosis not present

## 2022-05-13 DIAGNOSIS — N186 End stage renal disease: Secondary | ICD-10-CM | POA: Diagnosis not present

## 2022-05-13 DIAGNOSIS — J9612 Chronic respiratory failure with hypercapnia: Secondary | ICD-10-CM | POA: Diagnosis not present

## 2022-05-13 DIAGNOSIS — Z9981 Dependence on supplemental oxygen: Secondary | ICD-10-CM | POA: Diagnosis not present

## 2022-05-13 DIAGNOSIS — E1169 Type 2 diabetes mellitus with other specified complication: Secondary | ICD-10-CM | POA: Diagnosis not present

## 2022-05-13 DIAGNOSIS — M86371 Chronic multifocal osteomyelitis, right ankle and foot: Secondary | ICD-10-CM | POA: Diagnosis not present

## 2022-05-13 DIAGNOSIS — Z7985 Long-term (current) use of injectable non-insulin antidiabetic drugs: Secondary | ICD-10-CM | POA: Diagnosis not present

## 2022-05-14 ENCOUNTER — Ambulatory Visit: Payer: 59 | Attending: Infectious Diseases | Admitting: Infectious Diseases

## 2022-05-14 ENCOUNTER — Encounter: Payer: Self-pay | Admitting: Infectious Diseases

## 2022-05-14 VITALS — BP 127/81 | HR 89 | Temp 97.0°F

## 2022-05-14 DIAGNOSIS — L089 Local infection of the skin and subcutaneous tissue, unspecified: Secondary | ICD-10-CM | POA: Diagnosis not present

## 2022-05-14 DIAGNOSIS — J45909 Unspecified asthma, uncomplicated: Secondary | ICD-10-CM | POA: Diagnosis not present

## 2022-05-14 DIAGNOSIS — E1152 Type 2 diabetes mellitus with diabetic peripheral angiopathy with gangrene: Secondary | ICD-10-CM | POA: Diagnosis not present

## 2022-05-14 DIAGNOSIS — Z992 Dependence on renal dialysis: Secondary | ICD-10-CM | POA: Insufficient documentation

## 2022-05-14 DIAGNOSIS — R0689 Other abnormalities of breathing: Secondary | ICD-10-CM | POA: Diagnosis not present

## 2022-05-14 DIAGNOSIS — Z794 Long term (current) use of insulin: Secondary | ICD-10-CM

## 2022-05-14 DIAGNOSIS — Z9981 Dependence on supplemental oxygen: Secondary | ICD-10-CM | POA: Diagnosis not present

## 2022-05-14 DIAGNOSIS — E11628 Type 2 diabetes mellitus with other skin complications: Secondary | ICD-10-CM

## 2022-05-14 DIAGNOSIS — E1122 Type 2 diabetes mellitus with diabetic chronic kidney disease: Secondary | ICD-10-CM | POA: Diagnosis not present

## 2022-05-14 DIAGNOSIS — R053 Chronic cough: Secondary | ICD-10-CM | POA: Insufficient documentation

## 2022-05-14 DIAGNOSIS — E11621 Type 2 diabetes mellitus with foot ulcer: Secondary | ICD-10-CM | POA: Insufficient documentation

## 2022-05-14 DIAGNOSIS — M869 Osteomyelitis, unspecified: Secondary | ICD-10-CM | POA: Diagnosis not present

## 2022-05-14 DIAGNOSIS — Z7951 Long term (current) use of inhaled steroids: Secondary | ICD-10-CM | POA: Diagnosis not present

## 2022-05-14 DIAGNOSIS — I96 Gangrene, not elsewhere classified: Secondary | ICD-10-CM | POA: Diagnosis not present

## 2022-05-14 DIAGNOSIS — Z5181 Encounter for therapeutic drug level monitoring: Secondary | ICD-10-CM | POA: Diagnosis not present

## 2022-05-14 DIAGNOSIS — E8770 Fluid overload, unspecified: Secondary | ICD-10-CM | POA: Diagnosis not present

## 2022-05-14 DIAGNOSIS — M86171 Other acute osteomyelitis, right ankle and foot: Secondary | ICD-10-CM | POA: Diagnosis not present

## 2022-05-14 DIAGNOSIS — N186 End stage renal disease: Secondary | ICD-10-CM | POA: Insufficient documentation

## 2022-05-14 DIAGNOSIS — Z792 Long term (current) use of antibiotics: Secondary | ICD-10-CM | POA: Diagnosis not present

## 2022-05-14 NOTE — Progress Notes (Signed)
NAME: [name]  DOB: October 16, 1970  MRN: [id number]  Date/Time: 05/14/2022 11:56 AM   Subjective:  Follow-up after recent hospitalization between 04/15/2022 until 04/20/2022 for right foot infection. ? [name] is a 52 y.o. female well-known to me with a history of diabetes mellitus, right foot infection, PAD, dry gangrene of the foot, end-stage renal disease and has had a wound on the right foot since August 2022.[name]  She initially was in the hospital for the first time in August 22 and underwent amputation of the fifth ray on 10/21/2020.  At that time the foot had dry gangrene and it appeared to be not salvageable and she was recommended amputation.  She declined.  She then took 8 weeks of IV vancomycin and cefepime.  Then in February 2023 she presented to [facility], ED with abdominal pain and was diagnosed with pancreatitis.  At that time the right foot had chronic gangrenous changes with osteo of the fourth met head and podiatrist recommended amputation of the foot and she declined it again.  A biopsy of the  fourth met head came back as viable bone without evidence of acute osteomyelitis.  The culture had Candida parapsilosis.  She was seen by ID and was sent on p.o. fluconazole and IV vancomycin and cefepime during dialysis for a total of 6 weeks.  I saw her in April 2023 and the foot appeared stable and she was followed by wound clinic.  She had received 1 hyperbaric treatment but could not tolerate further treatment due to claustrophobia. She then was referred from the wound clinic in February 2024 to the hospital for IV antibiotics as the foot was looking infected. Patient did not have any fever or chills. She was again evaluated by podiatrist.  And she was discharged on IV vancomycin and cefepime for 6 weeks.  And she is here for follow-up. Patient is stable She has a chronic cough She is oxygen dependent. She is getting vancomycin and cefepime during dialysis.  And she gets weekly  labs. She has no other complaints.  Past Medical History:  Diagnosis Date   (HFpEF) heart failure with preserved ejection fraction ([place])    a. 11/2014 Echo: EF 50-55%, Gr1 DD; b. 06/2016 Echo: EF 60-65%, no rwma, mod dil LA, nl RV, triv eff.   Anxiety    Asthma    Bronchitis    CHF (congestive heart failure) (HCC)    Chronic cough    Chronic respiratory failure with hypoxia and hypercapnia ([place])    a. Home O2.  (2-4L)   CKD (chronic kidney disease), stage III (HCC)    Depression    Diabetes mellitus without complication (HCC)    Diverticulosis    Dyspnea    Dysrhythmia    per pt, no arrythmias   Environmental and seasonal allergies    Extreme obesity with alveolar hypoventilation (HCC)    Hypertension    Iron deficiency anemia    a. chronic blood loss->heavy menses.   Kidney failure    dialysis tues/thur/sat   ... left arm shunt   Orthopnea    Oxygen dependent    3l/min   Pericardial effusion    a. 11/2014 CT Chest: Mod effusion; b. 11/2014 Echo: small to mod circumferential effusion. No hemodynamic compromise.   Pneumonia    history   Poorly controlled diabetes mellitus ([place])    a. 11/2014 A1c 13.2.   Swelling of both lower extremities     Past Surgical History:  Procedure Laterality  Date   A/V FISTULAGRAM Left 06/09/2017   Procedure: A/V FISTULAGRAM;  Surgeon: Algernon Huxley, MD;  Location: [place] [facility];  Service: Cardiovascular;  Laterality: Left;   A/V FISTULAGRAM Left 06/16/2017   Procedure: A/V FISTULAGRAM;  Surgeon: Algernon Huxley, MD;  Location: [place] [facility];  Service: Cardiovascular;  Laterality: Left;   A/V FISTULAGRAM Left 01/28/2018   Procedure: A/V FISTULAGRAM;  Surgeon: Algernon Huxley, MD;  Location: [place] [facility];  Service: Cardiovascular;  Laterality: Left;   AMPUTATION TOE Right 10/21/2020   Procedure: AMPUTATION TOE-5th Digit Amputation Right Foot;  Surgeon: Edrick Kins, DPM;  Location: [facility];  Service: Podiatry;  Laterality: Right;    AV FISTULA PLACEMENT Left 04/25/2017   Procedure: ARTERIOVENOUS (AV) FISTULA CREATION;  Surgeon: Algernon Huxley, MD;  Location: [facility];  Service: Vascular;  Laterality: Left;   BONE BIOPSY Right 04/27/2021   Procedure: Right 4th metatarsal BONE BIOPSY;  Surgeon: [name], DPM;  Location: [facility];  Service: Podiatry;  Laterality: Right;   CATARACT EXTRACTION W/PHACO Left 01/21/2018   Procedure: CATARACT EXTRACTION PHACO AND INTRAOCULAR LENS PLACEMENT (IOC);  Surgeon: Leandrew Koyanagi, MD;  Location: [facility];  Service: Ophthalmology;  Laterality: Left;  [place] 01:55.5 CDE 9.05 EAUP 13.9 Fluid Pack Lot # [id number]   CESAREAN SECTION     times 3   DIALYSIS/PERMA CATHETER INSERTION Right 04/04/2017   Procedure: DIALYSIS/PERMA CATHETER INSERTION;  Surgeon: Katha Cabal, MD;  Location: [place] [facility];  Service: Cardiovascular;  Laterality: Right;   DIALYSIS/PERMA CATHETER REMOVAL N/A 09/17/2017   Procedure: DIALYSIS/PERMA CATHETER REMOVAL;  Surgeon: Algernon Huxley, MD;  Location: [place] [facility];  Service: Cardiovascular;  Laterality: N/A;   INCISION AND DRAINAGE Right 10/26/2020   Procedure: INCISION AND DRAINAGE;  Surgeon: [name], DPM;  Location: [facility];  Service: Podiatry;  Laterality: Right;   INSERTION OF DIALYSIS CATHETER N/A 04/16/2017   Procedure: INSERTION OF DIALYSIS PERM CATHETER;  Surgeon: Algernon Huxley, MD;  Location: [facility];  Service: Vascular;  Laterality: N/A;   LOWER EXTREMITY ANGIOGRAPHY Right 10/18/2020   Procedure: Lower Extremity Angiography;  Surgeon: Algernon Huxley, MD;  Location: [place] [facility];  Service: Cardiovascular;  Laterality: Right;   LOWER EXTREMITY ANGIOGRAPHY Right 04/17/2022   Procedure: Lower Extremity Angiography;  Surgeon: Algernon Huxley, MD;  Location: [place] [facility];  Service: Cardiovascular;  Laterality: Right;    Social History   Socioeconomic History   Marital status: Married    Spouse name: abjul   Number of children:  Not on file   Years of education: 12   Highest education level: High school graduate  Occupational History   Occupation: Disabled  Tobacco Use   Smoking status: Never    Passive exposure: Past   Smokeless tobacco: Never  Vaping Use   Vaping Use: Never used  Substance and Sexual Activity   Alcohol use: Not Currently    Comment: occasional   Drug use: Yes    Types: Marijuana   Sexual activity: Not Currently    Birth control/protection: None  Other Topics Concern   Not on file  Social History Narrative   Lives in [place] with her husband and 2 y/o child.  Three other children are grown and out of the house.  She is sedentary and does not routinely exercise.  On home O2.   Social Determinants of Health   Financial Resource Strain: High Risk (03/24/2017)  Overall Financial Resource Strain (CARDIA)    Difficulty of Paying Living Expenses: Very hard  Food Insecurity: No Food Insecurity (04/16/2022)   Hunger Vital Sign    Worried About Running Out of Food in the Last Year: Never true    Ran Out of Food in the Last Year: Never true  Transportation Needs: No Transportation Needs (04/16/2022)   PRAPARE - Hydrologist (Medical): No    Lack of Transportation (Non-Medical): No  Physical Activity: Inactive (03/24/2017)   Exercise Vital Sign    Days of Exercise per Week: 0 days    Minutes of Exercise per Session: 0 min  Stress: No Stress Concern Present (03/24/2017)   [place]    Feeling of Stress : Not at all  Social Connections: Somewhat Isolated (03/24/2017)   Social Connection and Isolation Panel [NHANES]    Frequency of Communication with Friends and Family: More than three times a week    Frequency of Social Gatherings with Friends and Family: Once a week    Attends Religious Services: Never    Marine scientist or Organizations: No    Attends Archivist Meetings: Never     Marital Status: Married  Human resources officer Violence: Not At Risk (04/16/2022)   Humiliation, Afraid, Rape, and Kick questionnaire    Fear of Current or Ex-Partner: No    Emotionally Abused: No    Physically Abused: No    Sexually Abused: No    Family History  Problem Relation Age of Onset   Diabetes Mother    Hypertension Mother    Hypertension Father    Diabetes Father    Hypertension Other    Diabetes Other    Breast cancer Maternal Aunt 87   Breast cancer Maternal Aunt 50   Allergies  Allergen Reactions   Dust Mite Extract Shortness Of Breath and Itching   Egg-Derived Products Itching    MAKES TONGUE ITCH but she will eat, only in small amounts   Mold Extract [Trichophyton] Shortness Of Breath and Itching   Pollen Extract Shortness Of Breath    Itching, shortness of breath, asthma like symptoms   Ozempic (0.25 Or 0.5 Mg-Dose) [Semaglutide(0.25 Or 0.5mg -Dos)] Other (See Comments)    pancreatitis   Sulfa Antibiotics Itching   Misc. Sulfonamide Containing Compounds    Feraheme [Ferumoxytol] Itching    Uses benadryl so she can get the medicine   I? Current Outpatient Medications  Medication Sig Dispense Refill   acetaminophen (TYLENOL) 500 MG tablet Take 1,000 mg by mouth every 6 (six) hours as needed for mild pain, fever or headache.     albuterol (PROVENTIL) (2.5 MG/3ML) 0.083% nebulizer solution Take 3 mLs (2.5 mg total) by nebulization every 6 (six) hours as needed for wheezing or shortness of breath. 75 mL 11   albuterol (VENTOLIN HFA) 108 (90 Base) MCG/ACT inhaler Inhale 2 puffs into the lungs every 6 (six) hours as needed for wheezing or shortness of breath. 18 g 2   aspirin EC 81 MG tablet Take 81 mg by mouth daily.      atorvastatin (LIPITOR) 40 MG tablet Take 1 tablet (40 mg total) by mouth daily. 90 tablet 3   budesonide (PULMICORT) 0.5 MG/2ML nebulizer solution Take by nebulization.     ceFEPime (MAXIPIME) IVPB Inject 2 g into the vein Every Tuesday,Thursday,and  Saturday with dialysis. Cefepime 2gm IV qHD on Tue, Thur, Sat Indication: Chronic right foot  infection Last Day of Therapy: 05/28/2022 Labs - weekly:  CBC/D, ESR, CMP.  Check weekly pre-hemodialysis vancomycin level (or as per protocol) Fax weekly lab results  promptly to (336) [phone] To be given at Hemodialysis center 17 Units 0   Cholecalciferol 100 MCG (4000 UT) CAPS Take 1 capsule by mouth once a week.     clindamycin (CLEOCIN T) 1 % lotion Apply 1 application topically daily.     doxepin (SINEQUAN) 10 MG capsule Take 1 capsule (10 mg total) by mouth at bedtime. 30 capsule 0   escitalopram (LEXAPRO) 10 MG tablet Take 1 tablet (10 mg total) by mouth daily. 30 tablet 2   ferric citrate (AURYXIA) 1 GM 210 MG(Fe) tablet Take 210 mg by mouth 3 (three) times daily with meals. Only taking once a day most days     formoterol (PERFOROMIST) 20 MCG/2ML nebulizer solution      insulin degludec (TRESIBA) 100 UNIT/ML FlexTouch Pen Inject 20 Units into the skin daily.     insulin lispro (HUMALOG) 100 UNIT/ML KwikPen Inject into the skin.     lactulose (CHRONULAC) 10 GM/15ML solution Take 30 mLs (20 g total) by mouth daily as needed for severe constipation. 240 mL 0   lidocaine-prilocaine (EMLA) cream Apply topically as needed (For access for dialysis).     LINZESS 290 MCG CAPS capsule Take 290 mcg by mouth every morning.     midodrine (PROAMATINE) 10 MG tablet Take 20 mg by mouth daily as needed.     montelukast (SINGULAIR) 10 MG tablet Take 1 tablet (10 mg total) by mouth at bedtime. 30 tablet 3   Omeprazole Magnesium (PRILOSEC PO)      OXYGEN Inhale 2-4 L into the lungs.     predniSONE (DELTASONE) 50 MG tablet Take 1 tablet by mouth daily in AM with food x 5 days 5 tablet 0   pregabalin (LYRICA) 50 MG capsule Take 100 mg by mouth every 8 (eight) hours as needed.     RENVELA 2.4 g PACK Take 2.4 g by mouth 3 (three) times daily.     revefenacin (YUPELRI) 175 MCG/3ML nebulizer solution      traMADol  (ULTRAM) 50 MG tablet Take 2 tablets (100 mg total) by mouth 3 (three) times daily. 180 tablet 1   vancomycin IVPB Inject 1,000 mg into the vein Every Tuesday,Thursday,and Saturday with dialysis. Vancomycin 1gm IV qHD on Tue,Thur, Sat  Indication: Chronic right foot infection Last Day of Therapy: 05/28/2022 Labs - weekly:  CBC/D, ESR, CMP.  Check weekly pre-hemodialysis vancomycin level (or as per protocol) Fax weekly lab results  promptly to (336) [phone] To be given at Hemodialysis center 17 Units 0   No current facility-administered medications for this visit.     Abtx:  Anti-infectives (From admission, onward)    None       REVIEW OF SYSTEMS:  Const: negative fever, negative chills, negative weight loss Eyes: negative diplopia or visual changes, negative eye pain ENT: negative coryza, negative sore throat Resp: negative cough, hemoptysis, dyspnea Cards: negative for chest pain, palpitations, lower extremity edema GU: negative for frequency, dysuria and hematuria GI: Negative for abdominal pain, diarrhea, bleeding, constipation Skin: negative for rash and pruritus Heme: negative for easy bruising and gum/nose bleeding MS: negative for myalgias, arthralgias, back pain and muscle weakness Neurolo:negative for headaches, dizziness, vertigo, memory problems  Psych: negative for feelings of anxiety, depression  Endocrine: negative for thyroid, diabetes Allergy/Immunology-multiple medications.  Antibiotic is sulfa allergy  VITALS:  BP 127/81   Pulse 89   Temp (!) 97 F (36.1 C) (Temporal)   LMP 10/14/2012 (Approximate)   PHYSICAL EXAM:  General: Alert, cooperative, no distress, in wheelchair. Head: Normocephalic, without obvious abnormality, atraumatic. Eyes: Conjunctivae clear, anicteric sclerae. Pupils are equal Lungs: Lateral air entry.  A few rhonchi in the bases. Heart: Regular rate and rhythm, no murmur, rub or gallop. Abdomen: Did not examine Extremities: Right  dressing removed The skin on the plantar surface is missing and the   subcutaneous tissues are exposed There is dry shriveled  skin of the toes 05/14/22   05/14/22  05/14/22   04/18/22   04/18/22     06/12/21   06/12/21   06/12/21   Skin: hyperpigmented papules over back/chest Lymph: Cervical, supraclavicular normal. Neurologic: Grossly non-focal Pertinent Labs CRP <1 WBC N  ? Impression/Recommendation  Diabetes mellitus with diabetic foot infection. PAD  Chronic right foot wound with dry gangrene present since 2022. The skin on the plantar aspect has been debrided and  subcutaneous tissue has been exposed.  The culture that was sent on 04/18/2022 is no growth.  On 04/01/2022 it was staph lugdunensis. Today it looks okay and she is  getting vancomycin and cefepime for 6 weeks and she will finish on 05/28/2022. She is following with wound clinic.  After 05/28/2022 she will not need any further antibiotics and will only need topical care.  End-stage renal disease on dialysis  PAD, angio done last admission did not show any vascular compromise.  Diabetes mellitus  Asthma/obesity/hypoventilation syndrome on oxygen supplementation.  ? ___________________________________________________ Discussed with patient, in detail. Follow-up as needed.  r Note:  This document was prepared using Dragon voice recognition software and may include unintentional dictation errors.

## 2022-05-15 DIAGNOSIS — I5032 Chronic diastolic (congestive) heart failure: Secondary | ICD-10-CM | POA: Diagnosis not present

## 2022-05-15 DIAGNOSIS — Z992 Dependence on renal dialysis: Secondary | ICD-10-CM | POA: Diagnosis not present

## 2022-05-15 DIAGNOSIS — I951 Orthostatic hypotension: Secondary | ICD-10-CM | POA: Diagnosis not present

## 2022-05-15 DIAGNOSIS — E1151 Type 2 diabetes mellitus with diabetic peripheral angiopathy without gangrene: Secondary | ICD-10-CM | POA: Diagnosis not present

## 2022-05-15 DIAGNOSIS — J9612 Chronic respiratory failure with hypercapnia: Secondary | ICD-10-CM | POA: Diagnosis not present

## 2022-05-15 DIAGNOSIS — E11621 Type 2 diabetes mellitus with foot ulcer: Secondary | ICD-10-CM | POA: Diagnosis not present

## 2022-05-15 DIAGNOSIS — E1165 Type 2 diabetes mellitus with hyperglycemia: Secondary | ICD-10-CM | POA: Diagnosis not present

## 2022-05-15 DIAGNOSIS — E1122 Type 2 diabetes mellitus with diabetic chronic kidney disease: Secondary | ICD-10-CM | POA: Diagnosis not present

## 2022-05-15 DIAGNOSIS — M86371 Chronic multifocal osteomyelitis, right ankle and foot: Secondary | ICD-10-CM | POA: Diagnosis not present

## 2022-05-15 DIAGNOSIS — I132 Hypertensive heart and chronic kidney disease with heart failure and with stage 5 chronic kidney disease, or end stage renal disease: Secondary | ICD-10-CM | POA: Diagnosis not present

## 2022-05-15 DIAGNOSIS — E872 Acidosis, unspecified: Secondary | ICD-10-CM | POA: Diagnosis not present

## 2022-05-15 DIAGNOSIS — Z794 Long term (current) use of insulin: Secondary | ICD-10-CM | POA: Diagnosis not present

## 2022-05-15 DIAGNOSIS — E1169 Type 2 diabetes mellitus with other specified complication: Secondary | ICD-10-CM | POA: Diagnosis not present

## 2022-05-15 DIAGNOSIS — J441 Chronic obstructive pulmonary disease with (acute) exacerbation: Secondary | ICD-10-CM | POA: Diagnosis not present

## 2022-05-15 DIAGNOSIS — L97513 Non-pressure chronic ulcer of other part of right foot with necrosis of muscle: Secondary | ICD-10-CM | POA: Diagnosis not present

## 2022-05-15 DIAGNOSIS — J9611 Chronic respiratory failure with hypoxia: Secondary | ICD-10-CM | POA: Diagnosis not present

## 2022-05-15 DIAGNOSIS — I70203 Unspecified atherosclerosis of native arteries of extremities, bilateral legs: Secondary | ICD-10-CM | POA: Diagnosis not present

## 2022-05-15 DIAGNOSIS — D631 Anemia in chronic kidney disease: Secondary | ICD-10-CM | POA: Diagnosis not present

## 2022-05-15 DIAGNOSIS — Z7985 Long-term (current) use of injectable non-insulin antidiabetic drugs: Secondary | ICD-10-CM | POA: Diagnosis not present

## 2022-05-15 DIAGNOSIS — N186 End stage renal disease: Secondary | ICD-10-CM | POA: Diagnosis not present

## 2022-05-15 DIAGNOSIS — Z9981 Dependence on supplemental oxygen: Secondary | ICD-10-CM | POA: Diagnosis not present

## 2022-05-16 ENCOUNTER — Other Ambulatory Visit: Payer: Self-pay | Admitting: Nurse Practitioner

## 2022-05-16 DIAGNOSIS — Z992 Dependence on renal dialysis: Secondary | ICD-10-CM | POA: Diagnosis not present

## 2022-05-16 DIAGNOSIS — E8770 Fluid overload, unspecified: Secondary | ICD-10-CM | POA: Diagnosis not present

## 2022-05-16 DIAGNOSIS — N186 End stage renal disease: Secondary | ICD-10-CM | POA: Diagnosis not present

## 2022-05-16 DIAGNOSIS — M86171 Other acute osteomyelitis, right ankle and foot: Secondary | ICD-10-CM | POA: Diagnosis not present

## 2022-05-17 ENCOUNTER — Encounter: Payer: 59 | Admitting: Physician Assistant

## 2022-05-17 DIAGNOSIS — I5042 Chronic combined systolic (congestive) and diastolic (congestive) heart failure: Secondary | ICD-10-CM | POA: Diagnosis not present

## 2022-05-17 DIAGNOSIS — M86371 Chronic multifocal osteomyelitis, right ankle and foot: Secondary | ICD-10-CM | POA: Diagnosis not present

## 2022-05-17 DIAGNOSIS — N186 End stage renal disease: Secondary | ICD-10-CM | POA: Diagnosis not present

## 2022-05-17 DIAGNOSIS — I96 Gangrene, not elsewhere classified: Secondary | ICD-10-CM | POA: Diagnosis not present

## 2022-05-17 DIAGNOSIS — E1169 Type 2 diabetes mellitus with other specified complication: Secondary | ICD-10-CM | POA: Diagnosis not present

## 2022-05-17 DIAGNOSIS — E1122 Type 2 diabetes mellitus with diabetic chronic kidney disease: Secondary | ICD-10-CM | POA: Diagnosis not present

## 2022-05-17 DIAGNOSIS — Z992 Dependence on renal dialysis: Secondary | ICD-10-CM | POA: Diagnosis not present

## 2022-05-17 DIAGNOSIS — I132 Hypertensive heart and chronic kidney disease with heart failure and with stage 5 chronic kidney disease, or end stage renal disease: Secondary | ICD-10-CM | POA: Diagnosis not present

## 2022-05-17 DIAGNOSIS — Z9981 Dependence on supplemental oxygen: Secondary | ICD-10-CM | POA: Diagnosis not present

## 2022-05-17 DIAGNOSIS — E11621 Type 2 diabetes mellitus with foot ulcer: Secondary | ICD-10-CM | POA: Diagnosis not present

## 2022-05-17 DIAGNOSIS — L97514 Non-pressure chronic ulcer of other part of right foot with necrosis of bone: Secondary | ICD-10-CM | POA: Diagnosis not present

## 2022-05-17 DIAGNOSIS — E1152 Type 2 diabetes mellitus with diabetic peripheral angiopathy with gangrene: Secondary | ICD-10-CM | POA: Diagnosis not present

## 2022-05-18 DIAGNOSIS — E8770 Fluid overload, unspecified: Secondary | ICD-10-CM | POA: Diagnosis not present

## 2022-05-18 DIAGNOSIS — Z992 Dependence on renal dialysis: Secondary | ICD-10-CM | POA: Diagnosis not present

## 2022-05-18 DIAGNOSIS — M86171 Other acute osteomyelitis, right ankle and foot: Secondary | ICD-10-CM | POA: Diagnosis not present

## 2022-05-18 DIAGNOSIS — N186 End stage renal disease: Secondary | ICD-10-CM | POA: Diagnosis not present

## 2022-05-18 NOTE — Progress Notes (Signed)
[name] ([id number]) [id number] Page 1 of 9 Visit Report for 05/17/2022 Chief Complaint Document Details Patient Name: Date of Service: [place], [name] 05/17/2022 12:00 PM Medical Record Number: [id number] Patient Account Number: [id number] Date of Birth/Sex: Treating RN: 12/23/1970 (52 y.o. [name] Primary Care Provider: Evern Bio Other Clinician: Massie Kluver Referring Provider: Treating Provider/Extender: Darien Ramus Weeks in Treatment: 62 Information Obtained from: Patient Chief Complaint Right foot gangrene and osteomyelitis Electronic Signature(s) Signed: 05/17/2022 12:59:31 PM By: Worthy Keeler PA-C Entered By: Worthy Keeler on 05/17/2022 12:59:30 -------------------------------------------------------------------------------- Debridement Details Patient Name: Date of Service: [name], [name] 05/17/2022 12:00 PM Medical Record Number: [id number] Patient Account Number: [id number] Date of Birth/Sex: Treating RN: [birth year] (52 y.o. [name] Primary Care Provider: Evern Bio Other Clinician: Massie Kluver Referring Provider: Treating Provider/Extender: Darien Ramus Weeks in Treatment: 52 Debridement Performed for Assessment: Wound #2 Right,Circumferential Foot Performed By: Physician Tommie Sams., PA-C Debridement Type: Debridement Severity of Tissue Pre Debridement: Necrosis of bone Level of Consciousness (Pre-procedure): Awake and Alert Pre-procedure Verification/Time Out Yes - 13:04 Taken: Start Time: 13:04 T Area Debrided (L x W): otal 2 (cm) x 2 (cm) = 4 (cm) Tissue and other material debrided: Viable, Non-Viable, Slough, Subcutaneous, Tendon, Slough Level: Skin/Subcutaneous Tissue/Muscle Debridement Description: Excisional Instrument: Forceps, Scissors Bleeding: Minimum Hemostasis Achieved: Pressure Response to Treatment: Procedure was tolerated well Level of  Consciousness (Post- Awake and Alert procedure): Post Debridement Measurements of Total Wound Length: (cm) 13 Width: (cm) 6.2 Depth: (cm) 0.2 Volume: (cm) 12.661 Character of Wound/Ulcer Post Debridement: Stable Severity of Tissue Post Debridement: Necrosis of bone Post Procedure Diagnosis Same as Pre-procedure Electronic Signature(s) Signed: 05/17/2022 2:10:36 PM By: Worthy Keeler PA-C Signed: 05/20/2022 4:47:10 PM By: Gretta Cool, BSN, RN, CWS, Kim RN, BSN Signed: 05/20/2022 5:19:29 PM By: Massie Kluver Entered By: Massie Kluver on 05/17/2022 13:10:10 [name] ([id number]) [id number] Page 2 of 9 -------------------------------------------------------------------------------- HPI Details Patient Name: Date of Service: [name], [name] 05/17/2022 12:00 PM Medical Record Number: [id number] Patient Account Number: [id number] Date of Birth/Sex: Treating RN: Apr 06, 1970 (52 y.o. [name] Primary Care Provider: Evern Bio Other Clinician: Massie Kluver Referring Provider: Treating Provider/Extender: Darien Ramus Weeks in Treatment: 68 History of Present Illness HPI Description: 10/04/2020 upon evaluation today patient presents for initial inspection here in the clinic concerning a fairly concerning wound over her right fifth toe towards the medial aspect which actually probes all the way down to bone. There is evidence of necrotic muscle as well and I do see tendon exposed as well. Subsequently this does have me very concerned about where things stand here. Again this is the first time that I am seeing her and on top of everything else her ABIs are noncompressible. I think that arterial flow is the primary concern here. Of note the patient states this was first noted July 1. She had a x-ray on July 4 which was negative for bone infection. She has been on 2 antibiotics she is unsure of the name. With that being said her most recent  hemoglobin A1c was 8.1 that was December 2022. She otherwise does have a history obviously of diabetes mellitus type 2, hypertension, end-stage renal disease with dependence on renal dialysis, and she is also dependent on supplemental oxygen. Readmission: 05/14/2021 this is a patient whom I previously seen October 04, 2020. With that being said she is subsequently on 21 October 2020 ended up  having an amputation of her fifth digit of her foot unfortunately. This had progressed fairly quickly into what sounds to been gangrene at that time. Subsequently right now based on what I am seeing the patient actually has a necrotic area on the end of her foot and has been recommended that the only option really here is going to be a amputation which would be a below-knee amputation. She is really not interested in that and wants to try to give this every chance it can to heal. For that reason I discussed with her that definitely we can see what we can do to try to improve this situation and try to help get this doing better as best we can. With that being said there is definitely a portion of her wound that is just not good to be able to heal no matter what it is already starting to auto amputate. This includes potentially digits of her foot as well based on what I am seeing. She does have confirmed osteomyelitis noted by MRI. She is also currently on IV vancomycin. She does undergo dialysis on Tuesday, Thursday, and Saturday. The patient does have a history of diabetes mellitus type 2, congestive heart failure, hypertension, end-stage renal disease, dependence on renal dialysis, and dependence on supplemental oxygen. With that being said I am going to need to look into her heart failure and the significance of this as far as hyperbarics is concerned before we initiate treatment. She is probably also going to need an updated EKG and chest x-ray unless its already been done recently. 05/28/2021 upon evaluation today  patient appears to be doing well with regard to her wound all things considered. I am going to try to see what we can do about trimmed away some of the necrotic tissue that is really just hanging on here and there and not really helpful. Feel like if we can slowly start doing this maybe we will be able to get to the point where this is better and better as far as cleaning up the surface of the wound is concerned. Still as I explained I do believe some of her toes are not salvageable but again she is looking more towards autoamputation versus want to do anything definitive here from a surgical standpoint. I have been researching whether she would be an okay candidate for hyperbarics or not and I found that her x-ray but she had a chest x-ray recently was negative for any acute disease and appear to be stable. EKG was also stable. She did also have a cardiac echo which showed that her ejection fraction was 60 to 65% and therefore I think she is a candidate for hyperbarics. Again I do believe that this can help with preventing amputation which right now she has been told that her only option would be a below-knee amputation. Nonetheless I think that if we can get the heel leading to spread and the majority of this cleared away that it is possible we might even be able to get a surgeon just to surgically remove the nonviable toes and not have to go the full way of complete amputation. Nonetheless we do have a ways to go before we will get to that point. Either way I think the hyperbarics could be an excellent support as far as that is concerned. 06-05-2021 upon evaluation today patient's wound is showing some signs of improvement. This does seem to be slow and of course it is understandable considering what we are seeing.  She has discussed and looked up information about hyperbarics and in the end comes back today and states that she does want to proceed. I honestly do feel like that is her best chance at  saving the foot. Even if we are able to get her into a surgeon after we obtain some good healing approximately so that there is not as much necrotic tissue in general she may even be able to have a transmetatarsal amputation and get this to heal. Again I am not saying this is can be an easy road but it may be her best road as far as thinking about how to achieve some type of complete healing as I do not believe the toes are viable at all to be honest. 07-06-2021 upon evaluation today patient appears to be doing well currently in regard to her foot all things considered. Again she unfortunately was unable to do the hyperbaric oxygen therapy this was due to her claustrophobia. Nonetheless she tells me that she is pleased with how the foot appears and she does feel like there is good improvement going on here. In general I think that we are on the right track. If she is going to take a very long time and to be honest the route of autoamputation which is pretty much the way this is going is not a quick or easy road but nonetheless she seems to be taking care of her foot quite well. She is in good spirits. 07-27-2021 upon evaluation today patient appears to be doing well currently in regard to her foot all things considered. A lot of the eschar is starting to lift up around the edges which is good there is still significant necrotic tissue on the distal portion of the foot. This is slowly going to work its way off. Based on what I am seeing currently I do not believe that there is any evidence of infection which is good news. 08-31-2021 upon evaluation today patient's wound actually appears to be doing decently well. I do not see anything in particular to try to trim away today. With that being said she continues to slowly allow this to do what is going to do as far as autoamputation is concerned. I did have another conversation with her today about the possibility of seeing a surgeon for a transmetatarsal  amputation which again would help to get this to close faster and obviously I think would be a much speedy year and safer way to proceed with amputation. With that being said she tells me that she would really prefer to just let this go at "God pace". 09-28-2021 upon evaluation today patient's foot actually appears to be doing much better. Fortunately I do not see any signs of infection which is great news and overall I am extremely pleased with where we stand today. There does not appear to be any signs of active infection locally or systemically which is great news. No fevers, chills, nausea, vomiting, or diarrhea. 10-26-2021 upon evaluation today patient appears to be doing well currently in regard to her foot all things considered. Fortunately I do not see any evidence of active infection locally or systemically at this time which is great news. No fevers, chills, nausea, vomiting, or diarrhea. She does have some of the eschar that is lifting up even a little bit more compared to last time I saw her. 11-30-2021 upon evaluation today patient's wound is still continuing to have some areas that lift up although there is nothing really  that I feel like I could trim away too much today to be honest. Fortunately I do not see any evidence of systemic infection though locally she has been concerned about some drainage I do not see anything that seems to be actively red or erythematous or warm to touch which is good news. No fevers, chills, nausea, vomiting, or diarrhea. 12-21-2021 upon evaluation patient's foot ulcer actually is showing signs of some improvement with healthier tissue noted at areas with eschar that keeps clearing off. There is a bit of the eschar that is loosening up today as well and I think we will get a be able to get some of this cleared away that we have been working on for a while. Fortunately I do not see any evidence of infection locally or systemically at this time which is great news.  No fevers, chills, nausea, vomiting, or diarrhea. [place], Richmond [name] ([id number]) [id number] Page 3 of 9 01-11-2022 upon evaluation today patient appears to be doing decently well in regard to her foot she still has a necrotic area on the tip of her foot but she is tolerating the dressing changes without complication. Fortunately there does not appear to be any signs of infection locally or systemically at this time which is great news. No fevers, chills, nausea, vomiting, or diarrhea. 02-01-2022 upon evaluation today patient continues to show signs of improvement in regard to the area where we have been able to remove the eschar. Unfortunately the tip of the foot I think is the part that is completely dead and not going to be coming back as far as any healing is concerned. I have discussed this with the patient multiple times however she does not want to have any type of surgical intervention. Obviously in that regard we will try to do what we can as best we can. 02-15-2022 upon evaluation today patient appears to be doing well with regard to her foot. She is actually showing some signs of improvement she has had some drainage but nothing appears to be doing too poorly overall. I do not see any signs of infection and I am pleased in that regard. Fortunately I do not see any evidence of infection locally nor systemically which is great news. 03-01-2022 upon evaluation today patient appears to be doing well currently in regard to her wounds. She has been tolerating the dressing changes without complication. Fortunately I do not see any signs of active infection locally nor systemically which is great news. No fevers, chills, nausea, vomiting, or diarrhea. 03-15-2022 upon evaluation today patient's wound continues to show signs of loosening the splint some of the eschar is concerned on the tip of her foot. Fortunately I do not see any evidence of infection systemically which is  good news locally I feel like she is making good progress here. Overall I am extremely happy with where things stand. Especially considering the fact that really she does need to have an amputation of the necrotic portion of the end of her foot. Again if we got rid of those toes I do think that this could actually heal much more effectively and quickly with that being said she is completely against going down that road so we will try to do what we can as conservatively as we can while still making some progress here. 04-01-2022 upon evaluation today patient's foot unfortunately does not appear to be doing as well as I would like to see. I discussed with her today that I do believe  she probably needs to be very mindful of this and how things are progressing. I explained as well that this is what I been most worried about the whole time that I been seeing her since really at least March of last year when I started seeing her again. Subsequently she notes that she has had more drainage also feel that the drainage is more discolored which is not good. 04-08-2022 upon evaluation today patient appears to be doing well currently in regard to her foot ulcer this is much better than last week's evaluation. Fortunately I do not see any signs of active infection systemically though locally we still see some drainage I think this is much better with the doxycycline she has been taking that since we got the results back of the culture nothing has really changed in that regard. Fortunately I think that we are on the right track. 04-15-2022 upon evaluation today patient unfortunately appears to be doing a bit worse compared to her normal. She tells me that she has been sick with what she presumed might have been a GI bug over the weekend but to be honest I am not so certain that she could not be showing early signs of sepsis. Her foot is starting to become a little bit more purulent as far as the drainage is concerned of  hide on doxycycline for a couple weeks already which seemed to help initially to a degree but unfortunately I am just not sure this is maintaining. It started to get a little bit more weight around the edges and is starting to especially around the lateral toe show signs of progression at this point. I am coupled with the fact that her blood pressure is low today compared to her normal concerned that we should probably have her go to the ER for further evaluation and treatment in order to evaluate for the possibility of early sepsis. This is something we have had a close eye on for quite some time and it is something that I think needs to be fully evaluated at this point. 05-17-2022 upon evaluation today patient's wound is continuing to maintain stability for the most part. I do not see any signs of infection at this point which is good news I am pleased with that. The tip of her foot where she has gangrene also has remained stable and dry which is also good news. Overall I am pleased with what we are seeing today. Electronic Signature(s) Signed: 05/21/2022 6:15:09 PM By: Worthy Keeler PA-C Entered By: Worthy Keeler on 05/21/2022 18:15:08 -------------------------------------------------------------------------------- Physical Exam Details Patient Name: Date of Service: [place], [name] 05/17/2022 12:00 PM Medical Record Number: [id number] Patient Account Number: [id number] Date of Birth/Sex: Treating RN: February 26, 1971 (52 y.o. [name] Primary Care Provider: Evern Bio Other Clinician: Massie Kluver Referring Provider: Treating Provider/Extender: Darien Ramus Weeks in Treatment: 43 Constitutional Well-nourished and well-hydrated in no acute distress. Respiratory normal breathing without difficulty. Psychiatric this patient is able to make decisions and demonstrates good insight into disease process. Alert and Oriented x 3. pleasant and cooperative. Notes Upon  inspection patient's wound bed did require some sharp debridement I was removing some necrotic tissue just below the eschared region and again the beyond this I do not think much is viable but again the patient is not at the point of wanting to proceed with any type of intervention with regard to surgery. Electronic Signature(s) Signed: 05/21/2022 6:15:29 PM By: Worthy Keeler PA-C  Entered By: Worthy Keeler on 05/21/2022 18:15:29 [place], Richmond [name] ([id number]) [id number] Page 4 of 9 -------------------------------------------------------------------------------- Physician Orders Details Patient Name: Date of Service: [place], [name] 05/17/2022 12:00 PM Medical Record Number: [id number] Patient Account Number: [id number] Date of Birth/Sex: Treating RN: 01/11/1971 (51 y.o. [name] Primary Care Provider: Evern Bio Other Clinician: Massie Kluver Referring Provider: Treating Provider/Extender: Darien Ramus Weeks in Treatment: 12 Verbal / Phone Orders: Yes Clinician: Cornell Barman Read Back and Verified: Yes Diagnosis Coding ICD-10 Coding Code Description M86.371 Chronic multifocal osteomyelitis, right ankle and foot E11.621 Type 2 diabetes mellitus with foot ulcer L97.514 Non-pressure chronic ulcer of other part of right foot with necrosis of bone I50.42 Chronic combined systolic (congestive) and diastolic (congestive) heart failure I10 Essential (primary) hypertension N18.6 End stage renal disease Z99.2 Dependence on renal dialysis Z99.81 Dependence on supplemental oxygen Follow-up Appointments Return Appointment in 3 weeks. Nurse Visit as needed [facility] for wound care. May utilize formulary equivalent dressing for wound treatment orders unless otherwise specified. Home Health Nurse may visit PRN to address patients wound care needs. Scheduled days for dressing changes to  be completed; exception, patient has scheduled wound care visit that day. **Please direct any NON-WOUND related issues/requests for orders to patient's Primary Care Physician. **If current dressing causes regression in wound condition, may D/C ordered dressing product/s and apply Normal Saline Moist Dressing daily until next [place] or Other MD appointment. **Notify [facility] of regression in wound condition at [phone]. Bathing/ Shower/ Hygiene Clean wound with Normal Saline or wound cleanser. Wash wounds with antibacterial soap and water. May shower; gently cleanse wound with antibacterial soap, rinse and pat dry prior to dressing wounds No tub bath. Off-Loading Open toe surgical shoe Medications-Please add to medication list. ntibiotics - continue Doxycycline P.O. A ntibiotic - gentamycin to open area of foot Topical A Wound Treatment Wound #2 - Foot Wound Laterality: Right, Circumferential Cleanser: Normal Saline 1 x Per Day/30 Days Discharge Instructions: Wash your hands with soap and water. Remove old dressing, discard into plastic bag and place into trash. Cleanse the wound with Normal Saline prior to applying a clean dressing using gauze sponges, not tissues or cotton balls. Do not scrub or use excessive force. Pat dry using gauze sponges, not tissue or cotton balls. Cleanser: Soap and Water 1 x Per Day/30 Days Discharge Instructions: Gently cleanse wound with antibacterial soap, rinse and pat dry prior to dressing wounds Topical: Gentamicin 1 x Per Day/30 Days Discharge Instructions: Apply to open area of foot Prim Dressing: Gauze 1 x Per Day/30 Days ary Discharge Instructions: soak with Betadine and wrap around toes Prim Dressing: Silvercel 4 1/4x 4 1/4 (in/in) 1 x Per Day/30 Days ary Discharge Instructions: Apply Silvercel 4 1/4x 4 1/4 (in/in) as instructed Secondary Dressing: ABD Pad 5x9 (in/in) 1 x Per Day/30 Days Discharge Instructions: Cover  with ABD pad Secured With: Medipore T - 53M Medipore H Soft Cloth Surgical T ape ape, 2x2 (in/yd) 1 x Per Day/30 Days Secured With: [employer] Sterile or Non-Sterile 6-ply 4.5x4 (yd/yd) 1 x Per Day/30 Days Discharge Instructions: Apply Kerlix as directed [place], Richmond [name] ([id number]) [id number] Page 5 of 9 Secured With: [employer], Latex-free, Size 5, Small-Head / Shoulder / Thigh 1 x Per Day/30 Days Electronic Signature(s) Signed: 05/17/2022 2:10:36 PM By: Worthy Keeler PA-C Signed: 05/20/2022 5:19:29 PM By: Massie Kluver Entered By: Massie Kluver  on 05/17/2022 13:11:02 -------------------------------------------------------------------------------- Problem List Details Patient Name: Date of Service: [place], [name] 05/17/2022 12:00 PM Medical Record Number: [id number] Patient Account Number: [id number] Date of Birth/Sex: Treating RN: Jan 31, 1971 (52 y.o. [name] Primary Care Provider: Evern Bio Other Clinician: Massie Kluver Referring Provider: Treating Provider/Extender: Darien Ramus Weeks in Treatment: 62 Active Problems ICD-10 Encounter Code Description Active Date MDM Diagnosis M86.371 Chronic multifocal osteomyelitis, right ankle and foot 05/18/2021 No Yes E11.621 Type 2 diabetes mellitus with foot ulcer 05/18/2021 No Yes L97.514 Non-pressure chronic ulcer of other part of right foot with necrosis of bone 05/18/2021 No Yes I50.42 Chronic combined systolic (congestive) and diastolic (congestive) heart failure 05/18/2021 No Yes I10 Essential (primary) hypertension 05/18/2021 No Yes N18.6 End stage renal disease 05/18/2021 No Yes Z99.2 Dependence on renal dialysis 05/18/2021 No Yes Z99.81 Dependence on supplemental oxygen 05/18/2021 No Yes Inactive Problems Resolved Problems Electronic Signature(s) Signed: 05/17/2022 12:59:24 PM By: Worthy Keeler PA-C Entered By: Worthy Keeler on 05/17/2022  12:59:23 -------------------------------------------------------------------------------- Progress Note Details Patient Name: Date of Service: [place], [name] 05/17/2022 12:00 PM [place], Richmond [name] ([id number]) [id number] Page 6 of 9 Medical Record Number: [id number] Patient Account Number: [id number] Date of Birth/Sex: Treating RN: Nov 20, 1970 (52 y.o. [name] Primary Care Provider: Evern Bio Other Clinician: Massie Kluver Referring Provider: Treating Provider/Extender: Darien Ramus Weeks in Treatment: 65 Subjective Chief Complaint Information obtained from Patient Right foot gangrene and osteomyelitis History of Present Illness (HPI) 10/04/2020 upon evaluation today patient presents for initial inspection here in the clinic concerning a fairly concerning wound over her right fifth toe towards the medial aspect which actually probes all the way down to bone. There is evidence of necrotic muscle as well and I do see tendon exposed as well. Subsequently this does have me very concerned about where things stand here. Again this is the first time that I am seeing her and on top of everything else her ABIs are noncompressible. I think that arterial flow is the primary concern here. Of note the patient states this was first noted July 1. She had a x-ray on July 4 which was negative for bone infection. She has been on 2 antibiotics she is unsure of the name. With that being said her most recent hemoglobin A1c was 8.1 that was December 2022. She otherwise does have a history obviously of diabetes mellitus type 2, hypertension, end-stage renal disease with dependence on renal dialysis, and she is also dependent on supplemental oxygen. Readmission: 05/14/2021 this is a patient whom I previously seen October 04, 2020. With that being said she is subsequently on 21 October 2020 ended up having an amputation of her fifth digit of her foot  unfortunately. This had progressed fairly quickly into what sounds to been gangrene at that time. Subsequently right now based on what I am seeing the patient actually has a necrotic area on the end of her foot and has been recommended that the only option really here is going to be a amputation which would be a below-knee amputation. She is really not interested in that and wants to try to give this every chance it can to heal. For that reason I discussed with her that definitely we can see what we can do to try to improve this situation and try to help get this doing better as best we can. With that being said there is definitely a portion of her wound that is just not good  to be able to heal no matter what it is already starting to auto amputate. This includes potentially digits of her foot as well based on what I am seeing. She does have confirmed osteomyelitis noted by MRI. She is also currently on IV vancomycin. She does undergo dialysis on Tuesday, Thursday, and Saturday. The patient does have a history of diabetes mellitus type 2, congestive heart failure, hypertension, end-stage renal disease, dependence on renal dialysis, and dependence on supplemental oxygen. With that being said I am going to need to look into her heart failure and the significance of this as far as hyperbarics is concerned before we initiate treatment. She is probably also going to need an updated EKG and chest x-ray unless its already been done recently. 05/28/2021 upon evaluation today patient appears to be doing well with regard to her wound all things considered. I am going to try to see what we can do about trimmed away some of the necrotic tissue that is really just hanging on here and there and not really helpful. Feel like if we can slowly start doing this maybe we will be able to get to the point where this is better and better as far as cleaning up the surface of the wound is concerned. Still as I explained I do  believe some of her toes are not salvageable but again she is looking more towards autoamputation versus want to do anything definitive here from a surgical standpoint. I have been researching whether she would be an okay candidate for hyperbarics or not and I found that her x-ray but she had a chest x-ray recently was negative for any acute disease and appear to be stable. EKG was also stable. She did also have a cardiac echo which showed that her ejection fraction was 60 to 65% and therefore I think she is a candidate for hyperbarics. Again I do believe that this can help with preventing amputation which right now she has been told that her only option would be a below-knee amputation. Nonetheless I think that if we can get the heel leading to spread and the majority of this cleared away that it is possible we might even be able to get a surgeon just to surgically remove the nonviable toes and not have to go the full way of complete amputation. Nonetheless we do have a ways to go before we will get to that point. Either way I think the hyperbarics could be an excellent support as far as that is concerned. 06-05-2021 upon evaluation today patient's wound is showing some signs of improvement. This does seem to be slow and of course it is understandable considering what we are seeing. She has discussed and looked up information about hyperbarics and in the end comes back today and states that she does want to proceed. I honestly do feel like that is her best chance at saving the foot. Even if we are able to get her into a surgeon after we obtain some good healing approximately so that there is not as much necrotic tissue in general she may even be able to have a transmetatarsal amputation and get this to heal. Again I am not saying this is can be an easy road but it may be her best road as far as thinking about how to achieve some type of complete healing as I do not believe the toes are viable at all to  be honest. 07-06-2021 upon evaluation today patient appears to be doing well currently  in regard to her foot all things considered. Again she unfortunately was unable to do the hyperbaric oxygen therapy this was due to her claustrophobia. Nonetheless she tells me that she is pleased with how the foot appears and she does feel like there is good improvement going on here. In general I think that we are on the right track. If she is going to take a very long time and to be honest the route of autoamputation which is pretty much the way this is going is not a quick or easy road but nonetheless she seems to be taking care of her foot quite well. She is in good spirits. 07-27-2021 upon evaluation today patient appears to be doing well currently in regard to her foot all things considered. A lot of the eschar is starting to lift up around the edges which is good there is still significant necrotic tissue on the distal portion of the foot. This is slowly going to work its way off. Based on what I am seeing currently I do not believe that there is any evidence of infection which is good news. 08-31-2021 upon evaluation today patient's wound actually appears to be doing decently well. I do not see anything in particular to try to trim away today. With that being said she continues to slowly allow this to do what is going to do as far as autoamputation is concerned. I did have another conversation with her today about the possibility of seeing a surgeon for a transmetatarsal amputation which again would help to get this to close faster and obviously I think would be a much speedy year and safer way to proceed with amputation. With that being said she tells me that she would really prefer to just let this go at "God pace". 09-28-2021 upon evaluation today patient's foot actually appears to be doing much better. Fortunately I do not see any signs of infection which is great news and overall I am extremely pleased with  where we stand today. There does not appear to be any signs of active infection locally or systemically which is great news. No fevers, chills, nausea, vomiting, or diarrhea. 10-26-2021 upon evaluation today patient appears to be doing well currently in regard to her foot all things considered. Fortunately I do not see any evidence of active infection locally or systemically at this time which is great news. No fevers, chills, nausea, vomiting, or diarrhea. She does have some of the eschar that is lifting up even a little bit more compared to last time I saw her. 11-30-2021 upon evaluation today patient's wound is still continuing to have some areas that lift up although there is nothing really that I feel like I could trim away too much today to be honest. Fortunately I do not see any evidence of systemic infection though locally she has been concerned about some drainage I do not see anything that seems to be actively red or erythematous or warm to touch which is good news. No fevers, chills, nausea, vomiting, or diarrhea. 12-21-2021 upon evaluation patient's foot ulcer actually is showing signs of some improvement with healthier tissue noted at areas with eschar that keeps clearing off. There is a bit of the eschar that is loosening up today as well and I think we will get a be able to get some of this cleared away that we have been working on for a while. Fortunately I do not see any evidence of infection locally or systemically at this time  which is great news. No fevers, chills, nausea, vomiting, or diarrhea. Letizia, Richmond [name] ([id number]) [id number] Page 7 of 9 01-11-2022 upon evaluation today patient appears to be doing decently well in regard to her foot she still has a necrotic area on the tip of her foot but she is tolerating the dressing changes without complication. Fortunately there does not appear to be any signs of infection locally or systemically at this time  which is great news. No fevers, chills, nausea, vomiting, or diarrhea. 02-01-2022 upon evaluation today patient continues to show signs of improvement in regard to the area where we have been able to remove the eschar. Unfortunately the tip of the foot I think is the part that is completely dead and not going to be coming back as far as any healing is concerned. I have discussed this with the patient multiple times however she does not want to have any type of surgical intervention. Obviously in that regard we will try to do what we can as best we can. 02-15-2022 upon evaluation today patient appears to be doing well with regard to her foot. She is actually showing some signs of improvement she has had some drainage but nothing appears to be doing too poorly overall. I do not see any signs of infection and I am pleased in that regard. Fortunately I do not see any evidence of infection locally nor systemically which is great news. 03-01-2022 upon evaluation today patient appears to be doing well currently in regard to her wounds. She has been tolerating the dressing changes without complication. Fortunately I do not see any signs of active infection locally nor systemically which is great news. No fevers, chills, nausea, vomiting, or diarrhea. 03-15-2022 upon evaluation today patient's wound continues to show signs of loosening the splint some of the eschar is concerned on the tip of her foot. Fortunately I do not see any evidence of infection systemically which is good news locally I feel like she is making good progress here. Overall I am extremely happy with where things stand. Especially considering the fact that really she does need to have an amputation of the necrotic portion of the end of her foot. Again if we got rid of those toes I do think that this could actually heal much more effectively and quickly with that being said she is completely against going down that road so we will try to do what  we can as conservatively as we can while still making some progress here. 04-01-2022 upon evaluation today patient's foot unfortunately does not appear to be doing as well as I would like to see. I discussed with her today that I do believe she probably needs to be very mindful of this and how things are progressing. I explained as well that this is what I been most worried about the whole time that I been seeing her since really at least March of last year when I started seeing her again. Subsequently she notes that she has had more drainage also feel that the drainage is more discolored which is not good. 04-08-2022 upon evaluation today patient appears to be doing well currently in regard to her foot ulcer this is much better than last week's evaluation. Fortunately I do not see any signs of active infection systemically though locally we still see some drainage I think this is much better with the doxycycline she has been taking that since we got the results back of the culture nothing has really changed  in that regard. Fortunately I think that we are on the right track. 04-15-2022 upon evaluation today patient unfortunately appears to be doing a bit worse compared to her normal. She tells me that she has been sick with what she presumed might have been a GI bug over the weekend but to be honest I am not so certain that she could not be showing early signs of sepsis. Her foot is starting to become a little bit more purulent as far as the drainage is concerned of hide on doxycycline for a couple weeks already which seemed to help initially to a degree but unfortunately I am just not sure this is maintaining. It started to get a little bit more weight around the edges and is starting to especially around the lateral toe show signs of progression at this point. I am coupled with the fact that her blood pressure is low today compared to her normal concerned that we should probably have her go to the ER for  further evaluation and treatment in order to evaluate for the possibility of early sepsis. This is something we have had a close eye on for quite some time and it is something that I think needs to be fully evaluated at this point. 05-17-2022 upon evaluation today patient's wound is continuing to maintain stability for the most part. I do not see any signs of infection at this point which is good news I am pleased with that. The tip of her foot where she has gangrene also has remained stable and dry which is also good news. Overall I am pleased with what we are seeing today. Objective Constitutional Well-nourished and well-hydrated in no acute distress. Vitals Time Taken: 12:29 PM, Temperature: 98.3 F, Pulse: 112 bpm, Respiratory Rate: 18 breaths/min, Blood Pressure: 91/60 mmHg, Pulse Oximetry: 93 %. Respiratory normal breathing without difficulty. Psychiatric this patient is able to make decisions and demonstrates good insight into disease process. Alert and Oriented x 3. pleasant and cooperative. General Notes: Upon inspection patient's wound bed did require some sharp debridement I was removing some necrotic tissue just below the eschared region and again the beyond this I do not think much is viable but again the patient is not at the point of wanting to proceed with any type of intervention with regard to surgery. Integumentary (Hair, Skin) Wound #2 status is Open. Original cause of wound was Gradually Appeared. The date acquired was: 10/15/2020. The wound has been in treatment 52 weeks. The wound is located on the Right,Circumferential Foot. The wound measures 13cm length x 6.2cm width x 0.2cm depth; 63.303cm^2 area and 12.661cm^3 volume. There is Fat Layer (Subcutaneous Tissue) exposed. There is no tunneling or undermining noted. There is a medium amount of serosanguineous drainage noted. There is medium (34-66%) granulation within the wound bed. There is a medium (34-66%) amount of  necrotic tissue within the wound bed including Eschar and Necrosis of Bone. Assessment Active Problems ICD-10 Chronic multifocal osteomyelitis, right ankle and foot Type 2 diabetes mellitus with foot ulcer Non-pressure chronic ulcer of other part of right foot with necrosis of bone Bushong, Richmond [name] ([id number]) [id number] Page 8 of 9 Chronic combined systolic (congestive) and diastolic (congestive) heart failure Essential (primary) hypertension End stage renal disease Dependence on renal dialysis Dependence on supplemental oxygen Procedures Wound #2 Pre-procedure diagnosis of Wound #2 is a Diabetic Wound/Ulcer of the Lower Extremity located on the Right,Circumferential Foot .Severity of Tissue Pre Debridement is: Necrosis of bone. There was a Excisional  Skin/Subcutaneous Tissue/Muscle Debridement with a total area of 4 sq cm performed by Tommie Sams., PA-C. With the following instrument(s): Forceps, and Scissors to remove Viable and Non-Viable tissue/material. Material removed includes Tendon, Subcutaneous Tissue, and Slough. A time out was conducted at 13:04, prior to the start of the procedure. A Minimum amount of bleeding was controlled with Pressure. The procedure was tolerated well. Post Debridement Measurements: 13cm length x 6.2cm width x 0.2cm depth; 12.661cm^3 volume. Character of Wound/Ulcer Post Debridement is stable. Severity of Tissue Post Debridement is: Necrosis of bone. Post procedure Diagnosis Wound #2: Same as Pre-Procedure Plan Follow-up Appointments: Return Appointment in 3 weeks. Nurse Visit as needed Home Health: [facility] for wound care. May utilize formulary equivalent dressing for wound treatment orders unless otherwise specified. Home Health Nurse may visit PRN to address patientoos wound care needs. Scheduled days for dressing changes to be completed; exception, patient has scheduled  wound care visit that day. **Please direct any NON-WOUND related issues/requests for orders to patient's Primary Care Physician. **If current dressing causes regression in wound condition, may D/C ordered dressing product/s and apply Normal Saline Moist Dressing daily until next [facility] or Other MD appointment. **Notify [facility] of regression in wound condition at [phone]. Bathing/ Shower/ Hygiene: Clean wound with Normal Saline or wound cleanser. Wash wounds with antibacterial soap and water. May shower; gently cleanse wound with antibacterial soap, rinse and pat dry prior to dressing wounds No tub bath. Off-Loading: Open toe surgical shoe Medications-Please add to medication list.: P.O. Antibiotics - continue Doxycycline Topical Antibiotic - gentamycin to open area of foot WOUND #2: - Foot Wound Laterality: Right, Circumferential Cleanser: Normal Saline 1 x Per Day/30 Days Discharge Instructions: Wash your hands with soap and water. Remove old dressing, discard into plastic bag and place into trash. Cleanse the wound with Normal Saline prior to applying a clean dressing using gauze sponges, not tissues or cotton balls. Do not scrub or use excessive force. Pat dry using gauze sponges, not tissue or cotton balls. Cleanser: Soap and Water 1 x Per Day/30 Days Discharge Instructions: Gently cleanse wound with antibacterial soap, rinse and pat dry prior to dressing wounds Topical: Gentamicin 1 x Per Day/30 Days Discharge Instructions: Apply to open area of foot Prim Dressing: Gauze 1 x Per Day/30 Days ary Discharge Instructions: soak with Betadine and wrap around toes Prim Dressing: Silvercel 4 1/4x 4 1/4 (in/in) 1 x Per Day/30 Days ary Discharge Instructions: Apply Silvercel 4 1/4x 4 1/4 (in/in) as instructed Secondary Dressing: ABD Pad 5x9 (in/in) 1 x Per Day/30 Days Discharge Instructions: Cover with ABD pad Secured With: Medipore T - 81M Medipore H Soft  Cloth Surgical T ape ape, 2x2 (in/yd) 1 x Per Day/30 Days Secured With: [employer] Sterile or Non-Sterile 6-ply 4.5x4 (yd/yd) 1 x Per Day/30 Days Discharge Instructions: Apply Kerlix as directed Secured With: [employer] Dressing, Latex-free, Size 5, Small-Head / Shoulder / Thigh 1 x Per Day/30 Days 1. Based on what I am seeing I do believe that the patient would continue with the recommendations currently with regard to her wound. This is good to be an ABD pad over top of silver cell for the portion of the wound which is actually open and for the tip where she has primarily eschar and gangrene I would recommend that we continue to Betadine. 2. I am also recommend she continue to stay off of this is really not much  I can do to make a change with regard to that as anything else would cause this to likely breakdown to a greater degree. We will see patient back for reevaluation in 3 weeks here in the clinic. If anything worsens or changes patient will contact our office for additional recommendations. Electronic Signature(s) Signed: 05/21/2022 6:16:23 PM By: Worthy Keeler PA-C Entered By: Worthy Keeler on 05/21/2022 18:16:22 Hoot, Richmond [name] ([id number]) [id number] Page 9 of 9 -------------------------------------------------------------------------------- SuperBill Details Patient Name: Date of Service: [place], [name] 05/17/2022 Medical Record Number: [id number] Patient Account Number: [id number] Date of Birth/Sex: Treating RN: 16-Nov-1970 (52 y.o. [name] Primary Care Provider: Evern Bio Other Clinician: Massie Kluver Referring Provider: Treating Provider/Extender: Darien Ramus Weeks in Treatment: 21 Diagnosis Coding ICD-10 Codes Code Description M86.371 Chronic multifocal osteomyelitis, right ankle and foot E11.621 Type 2 diabetes mellitus with foot ulcer L97.514 Non-pressure chronic ulcer of other part of right foot with  necrosis of bone I50.42 Chronic combined systolic (congestive) and diastolic (congestive) heart failure I10 Essential (primary) hypertension N18.6 End stage renal disease Z99.2 Dependence on renal dialysis Z99.81 Dependence on supplemental oxygen Facility Procedures : 3 CPT4 Code: [id number] Description: [facility] - DEB MUSC/FASCIA 20 SQ CM/< ICD-10 Diagnosis Description L97.514 Non-pressure chronic ulcer of other part of right foot with necrosis of bone Modifier: Quantity: 1 Physician Procedures : CPT4 Code Description Modifier [id number] - WC PHYS DEBR MUSCLE/FASCIA 20 SQ CM ICD-10 Diagnosis Description L97.514 Non-pressure chronic ulcer of other part of right foot with necrosis of bone Quantity: 1 Electronic Signature(s) Signed: 05/21/2022 6:16:37 PM By: Worthy Keeler PA-C Entered By: Worthy Keeler on 05/21/2022 18:16:37

## 2022-05-19 DIAGNOSIS — I5032 Chronic diastolic (congestive) heart failure: Secondary | ICD-10-CM | POA: Diagnosis not present

## 2022-05-19 DIAGNOSIS — J449 Chronic obstructive pulmonary disease, unspecified: Secondary | ICD-10-CM | POA: Diagnosis not present

## 2022-05-19 DIAGNOSIS — J9611 Chronic respiratory failure with hypoxia: Secondary | ICD-10-CM | POA: Diagnosis not present

## 2022-05-19 NOTE — Progress Notes (Unsigned)
Cardiology Office Note  Date:  05/20/2022   ID:  [name], DOB [birth year], MRN [id number]  PCP:  Evern Bio, NP   Chief Complaint  Patient presents with   3 month follow up    Patient c/o chest pain/tightness & shortness of breath. Medications reviewed by the patient verbally.     HPI:  [name] is a 52 yo woman with medical history of  morbidly obese  uncontrolled diabetes   chronic diastolic CHF,  anemia,  Asthma Morbid obesity ESRD on HD 3w a week, started 03/2017 Osteomyelitis foot Moderate mitral valve stenosis/pulmonary hypertension On chronic oxygen who presents for  follow-up of her diastolic CHF.  Last seen in clinic 8/23 In hospital for  foot infection, d/c 04/20/22 Had LE angio: no stenosis Records reviewed  HD on Tuesday/Thursday/Saturday Gets to dry weight she believes Previously reported if she gets too dry blood pressure will drop, gets orthostasis  Rare low pressure 94 systolic, midodrine PRN  Echocardiogram with moderate pulmonary hypertension Recent chest x-ray with dilated pulmonary vasculature  Presents today in a wheelchair, weight running high Right foot in a wrap, wound healing   EKG personally reviewed by myself on todays visit Sinus tachycardia rate 111 bpm poor R wave progression   Echo: Reviewed  1. Left ventricular ejection fraction, by estimation, is 60 to 65%. The  left ventricle has normal function. The left ventricle has no regional  wall motion abnormalities. The left ventricular internal cavity size was  not well visualized. Left ventricular   diastolic function could not be evaluated.   2. Right ventricular systolic function is low normal. The right  ventricular size is normal. Mildly increased right ventricular wall  thickness. There is moderately elevated pulmonary artery systolic  pressure. The estimated right ventricular systolic  pressure is [id number] mmHg.   3. Left atrial size was moderately dilated.    4. Right atrial size was mildly dilated.   5. The mitral valve is abnormal. Trivial mitral valve regurgitation.  Moderate to severe mitral stenosis. The mean mitral valve gradient is 8.0  mmHg. Moderate mitral annular calcification.   6. Tricuspid valve regurgitation is moderate.   7. The aortic valve was not well visualized. Aortic valve regurgitation  is not visualized. No aortic stenosis is present.   8. The inferior vena cava is normal in size with <50% respiratory  variability, suggesting right atrial pressure of 8 mmHg.   Comparison(s): EF 60%, mild LVH, RVSP 78.69mmHg.   Covid 02/2019,   Echocardiogram September 2022 Ejection fraction 60 to [id number], grade 2 diastolic dysfunction Severely elevated right heart pressures/pulmonary hypertension 78 mmHg  Followed by Dr. Patsey Berthold for her pulmonary issues, asthmatic bronchitis  Hospitalized April 21, 2021 for pancreatitis, abdominal pain  Lab work reviewed A1c improved 6.2 down from 8-10  Dr. Candiss Norse follows ESRD  Trying to get out of many of her medications  stopped celexa, stopped statin on her own Reports she is out of her clonidine and metoprolol, taking these But does report blood pressure has been running high at times, not on dialysis days  Prior hospitalization records reviewed  hospitalized 10/15/2020 to 11/10/2020 secondary to gangrene of the right foot with osteomyelitis status post fifth ray amputation and I&D on 8/27 as well as washout procedure 9/1.  She was recommended for IV vancomycin, cefepime for 4 weeks, end date 9/28.  Readmission for CHF in 9/22  On chronic oxygen, 3 liters, followed by gonzalez  Hx of diabetic foot ulcer  requiring prolonged period of antibiotics Details per ID notes  August  2022 was in the hospital with right diabetic foot infection with necrosis and osteomyelitis.  She underwent amputation of the fifth ray on 10/21/2020.  This was followed by a washout procedure on 10/26/2020.  As the foot did  not appear salvageable amputation was recommended but podiatrist.  She declined.  She took IV vancomycin and cefepime for 8 weeks until 12/19/2020 during dialysis. Diabetic foot infection- necrotic with osteomyelitis- amputation of fifth ray and I&D on 8/27 -Washout procedure on 10/26/2020 -Recommended amputation which patient had declined  -Took  IV vancomycin and cefepime with dialysis -for 8 weeks until 12/19/20 I had seen her on 02/22/2021 and recommended no antibiotics as there was no signs of acute infection. -Repeat MRI February 2023 showing osteo  PMH:   has a past medical history of (HFpEF) heart failure with preserved ejection fraction (HCC), Anxiety, Asthma, Bronchitis, CHF (congestive heart failure) ([place]), Chronic cough, Chronic respiratory failure with hypoxia and hypercapnia ([place]), CKD (chronic kidney disease), stage III ([place]), Depression, Diabetes mellitus without complication ([place]), Diverticulosis, Dyspnea, Dysrhythmia, Environmental and seasonal allergies, Extreme obesity with alveolar hypoventilation ([place]), Hypertension, Iron deficiency anemia, Kidney failure, Orthopnea, Oxygen dependent, Pericardial effusion, Pneumonia, Poorly controlled diabetes mellitus ([place]), and Swelling of both lower extremities.  PSH:    Past Surgical History:  Procedure Laterality Date   A/V FISTULAGRAM Left 06/09/2017   Procedure: A/V FISTULAGRAM;  Surgeon: Algernon Huxley, MD;  Location: [facility];  Service: Cardiovascular;  Laterality: Left;   A/V FISTULAGRAM Left 06/16/2017   Procedure: A/V FISTULAGRAM;  Surgeon: Algernon Huxley, MD;  Location: [facility];  Service: Cardiovascular;  Laterality: Left;   A/V FISTULAGRAM Left 01/28/2018   Procedure: A/V FISTULAGRAM;  Surgeon: Algernon Huxley, MD;  Location: [facility];  Service: Cardiovascular;  Laterality: Left;   AMPUTATION TOE Right 10/21/2020   Procedure: AMPUTATION TOE-5th Digit Amputation Right Foot;  Surgeon: Edrick Kins, DPM;   Location: [facility];  Service: Podiatry;  Laterality: Right;   AV FISTULA PLACEMENT Left 04/25/2017   Procedure: ARTERIOVENOUS (AV) FISTULA CREATION;  Surgeon: Algernon Huxley, MD;  Location: [facility];  Service: Vascular;  Laterality: Left;   BONE BIOPSY Right 04/27/2021   Procedure: Right 4th metatarsal BONE BIOPSY;  Surgeon: [name], DPM;  Location: [facility];  Service: Podiatry;  Laterality: Right;   CATARACT EXTRACTION W/PHACO Left 01/21/2018   Procedure: CATARACT EXTRACTION PHACO AND INTRAOCULAR LENS PLACEMENT (IOC);  Surgeon: Leandrew Koyanagi, MD;  Location: [facility];  Service: Ophthalmology;  Laterality: Left;  [place] 01:55.5 CDE 9.05 EAUP 13.9 Fluid Pack Lot # [id number]   CESAREAN SECTION     times 3   DIALYSIS/PERMA CATHETER INSERTION Right 04/04/2017   Procedure: DIALYSIS/PERMA CATHETER INSERTION;  Surgeon: Katha Cabal, MD;  Location: [facility];  Service: Cardiovascular;  Laterality: Right;   DIALYSIS/PERMA CATHETER REMOVAL N/A 09/17/2017   Procedure: DIALYSIS/PERMA CATHETER REMOVAL;  Surgeon: Algernon Huxley, MD;  Location: [facility];  Service: Cardiovascular;  Laterality: N/A;   INCISION AND DRAINAGE Right 10/26/2020   Procedure: INCISION AND DRAINAGE;  Surgeon: [name], DPM;  Location: [facility];  Service: Podiatry;  Laterality: Right;   INSERTION OF DIALYSIS CATHETER N/A 04/16/2017   Procedure: INSERTION OF DIALYSIS PERM CATHETER;  Surgeon: Algernon Huxley, MD;  Location: [facility];  Service: Vascular;  Laterality: N/A;   LOWER EXTREMITY ANGIOGRAPHY Right 10/18/2020  Procedure: Lower Extremity Angiography;  Surgeon: Algernon Huxley, MD;  Location: [facility];  Service: Cardiovascular;  Laterality: Right;   LOWER EXTREMITY ANGIOGRAPHY Right 04/17/2022   Procedure: Lower Extremity Angiography;  Surgeon: Algernon Huxley, MD;  Location: [facility];  Service: Cardiovascular;  Laterality: Right;    Current Outpatient Medications  Medication Sig  Dispense Refill   acetaminophen (TYLENOL) 500 MG tablet Take 1,000 mg by mouth every 6 (six) hours as needed for mild pain, fever or headache.     albuterol (PROVENTIL) (2.5 MG/3ML) 0.083% nebulizer solution Take 3 mLs (2.5 mg total) by nebulization every 6 (six) hours as needed for wheezing or shortness of breath. 75 mL 11   albuterol (VENTOLIN HFA) 108 (90 Base) MCG/ACT inhaler Inhale 2 puffs into the lungs every 6 (six) hours as needed for wheezing or shortness of breath. 18 g 2   aspirin EC 81 MG tablet Take 81 mg by mouth daily.      atorvastatin (LIPITOR) 40 MG tablet Take 1 tablet (40 mg total) by mouth daily. 90 tablet 3   budesonide (PULMICORT) 0.5 MG/2ML nebulizer solution Take by nebulization.     ceFEPime (MAXIPIME) IVPB Inject 2 g into the vein Every Tuesday,Thursday,and Saturday with dialysis. Cefepime 2gm IV qHD on Tue, Thur, Sat Indication: Chronic right foot infection Last Day of Therapy: 05/28/2022 Labs - weekly:  CBC/D, ESR, CMP.  Check weekly pre-hemodialysis vancomycin level (or as per protocol) Fax weekly lab results  promptly to [phone] To be given at Hemodialysis center 17 Units 0   clindamycin (CLEOCIN T) 1 % lotion Apply 1 application topically daily.     doxepin (SINEQUAN) 10 MG capsule Take 1 capsule (10 mg total) by mouth at bedtime. 30 capsule 0   escitalopram (LEXAPRO) 10 MG tablet Take 1 tablet (10 mg total) by mouth daily. 30 tablet 2   ferric citrate (AURYXIA) 1 GM 210 MG(Fe) tablet Take 210 mg by mouth 3 (three) times daily with meals. Only taking once a day most days     formoterol (PERFOROMIST) 20 MCG/2ML nebulizer solution      insulin degludec (TRESIBA) 100 UNIT/ML FlexTouch Pen Inject 20 Units into the skin daily.     insulin lispro (HUMALOG) 100 UNIT/ML KwikPen Inject into the skin.     lactulose (CHRONULAC) 10 GM/15ML solution Take 30 mLs (20 g total) by mouth daily as needed for severe constipation. 240 mL 0   lidocaine-prilocaine (EMLA) cream  Apply topically as needed (For access for dialysis).     LINZESS 290 MCG CAPS capsule TAKE 1 CAPSULE BY MOUTH IN THE MORNING 30  MINUTES  BEFORE  BREAKFAST  WITH  A  GLASS  OF  WATER  FOR  CHRONIC  IDEOPATHIC  CONSTIPATION. 30 capsule 0   midodrine (PROAMATINE) 10 MG tablet Take 20 mg by mouth daily as needed.     montelukast (SINGULAIR) 10 MG tablet Take 1 tablet (10 mg total) by mouth at bedtime. 30 tablet 3   Omeprazole Magnesium (PRILOSEC PO)      OXYGEN Inhale 2-4 L into the lungs.     pregabalin (LYRICA) 50 MG capsule Take 100 mg by mouth every 8 (eight) hours as needed.     RENVELA 2.4 g PACK Take 2.4 g by mouth 3 (three) times daily.     traMADol (ULTRAM) 50 MG tablet Take 2 tablets (100 mg total) by mouth 3 (three) times daily. 180 tablet 1   vancomycin  IVPB Inject 1,000 mg into the vein Every Tuesday,Thursday,and Saturday with dialysis. Vancomycin 1gm IV qHD on Tue,Thur, Sat  Indication: Chronic right foot infection Last Day of Therapy: 05/28/2022 Labs - weekly:  CBC/D, ESR, CMP.  Check weekly pre-hemodialysis vancomycin level (or as per protocol) Fax weekly lab results  promptly to [phone] To be given at Hemodialysis center 17 Units 0   Vitamin D, Cholecalciferol, 25 MCG (1000 UT) TABS Take 1,000 Units by mouth daily.     predniSONE (DELTASONE) 50 MG tablet Take 1 tablet by mouth daily in AM with food x 5 days (Patient not taking: Reported on 05/20/2022) 5 tablet 0   revefenacin (YUPELRI) 175 MCG/3ML nebulizer solution  (Patient not taking: Reported on 05/20/2022)     No current facility-administered medications for this visit.    Allergies:   Dust mite extract, Egg-derived products, Mold extract [trichophyton], Pollen extract, Ozempic (0.25 or 0.5 mg-dose) [semaglutide(0.25 or 0.5mg -dos)], Sulfa antibiotics, Misc. sulfonamide containing compounds, and Feraheme [ferumoxytol]   Social History:  The patient  reports that she has never smoked. She has been exposed to tobacco smoke.  She has never used smokeless tobacco. She reports that she does not currently use alcohol. She reports current drug use. Drug: Marijuana.   Family History:   family history includes Breast cancer (age of onset: 64) in her maternal aunt and maternal aunt; Diabetes in her father, mother, and another family member; Hypertension in her father, mother, and another family member.    Review of Systems: Review of Systems  Constitutional: Negative.   HENT: Negative.    Respiratory:  Positive for shortness of breath.   Cardiovascular: Negative.   Gastrointestinal: Negative.   Musculoskeletal: Negative.   Neurological: Negative.   Psychiatric/Behavioral: Negative.    All other systems reviewed and are negative.   PHYSICAL EXAM: VS:  BP 108/70 (BP Location: Right Arm, Patient Position: Sitting, Cuff Size: Large)   Pulse (!) 111   Ht 5' 4.5" (1.638 m)   Wt 266 lb 12.1 oz (121 kg)   LMP 10/14/2012 (Approximate)   SpO2 92% Comment: O2--4 Liters  BMI 45.08 kg/m  , BMI Body mass index is 45.08 kg/m. Constitutional:  oriented to person, place, and time. No distress.  In wheelchair, obese HENT:  Head: Grossly normal Eyes:  no discharge. No scleral icterus.  Neck: No JVD, no carotid bruits  Cardiovascular: Regular rate and rhythm, no murmurs appreciated Pulmonary/Chest: Clear to auscultation bilaterally, no wheezes or rails Abdominal: Soft.  no distension.  no tenderness.  Musculoskeletal: Normal range of motion Neurological:  normal muscle tone. Coordination normal. No atrophy Skin: Skin warm and dry Psychiatric: normal affect, pleasant  Recent Labs: 04/17/2022: ALT 15 04/20/2022: BUN 61; Creatinine, Ser 8.07; Hemoglobin 11.1; Platelets 227; Potassium 4.6; Sodium 130    Lipid Panel Lab Results  Component Value Date   CHOL 232 (H) 01/03/2015   HDL 41 01/03/2015   LDLCALC 169 (H) 01/03/2015   TRIG 151 (H) 04/21/2021     Wt Readings from Last 3 Encounters:  05/20/22 266 lb 12.1 oz  (121 kg)  05/09/22 268 lb (121.6 kg)  04/24/22 (!) 502 lb 10.4 oz (228 kg)    ASSESSMENT AND PLAN:   Chronic diastolic congestive heart failure (HCC) - Fluid managed by hemodialysis, Recommend she talk with Dr. Candiss Norse whether she needs torsemide on nondialysis days Echocardiogram in 2022 with markedly elevated right heart pressures greater than 70 Repeat echocardiogram with improved right heart pressures but still moderately  elevated 54 Pulmonary vascular congestion also noticed on recent chest x-ray Minimally active, on oxygen, Recommended weight loss,  previously on Ozempic  Pulmonary hypertension In the setting of end-stage renal disease on hemodialysis, mitral valve stenosis, obesity hypoventilation Moderately elevated right heart pressures even at dialysis Consider torsemide on nondialysis days  Essential hypertension -  Blood pressure is well controlled on today's visit. No changes made to the medications.  Using midodrine before dialysis  Asthma, unspecified asthma severity,  Prior Covid infection Pulmonary hypertension, obesity hypoventilation On oxygen  Morbid obesity due to excess calories (HCC) History of poor diet, previously on Ozempic No regular exercise program  Poorly controlled type 2 diabetes mellitus ([place]) Hemoglobin A1c chronically elevated ,  Working with primary care  Anemia Managed by nephrology  Mitral valve stenosis Mean gradient of 8 on echocardiogram, images reviewed Thickened leaflets on echo Repeat echocardiogram in 1 year    Total encounter time more than 30 minutes  Greater than 50% was spent in counseling and coordination of care with the patient     No orders of the defined types were placed in this encounter.    Signed, Esmond Plants, M.D., Ph.D. 05/20/2022  [place], [place]

## 2022-05-20 ENCOUNTER — Ambulatory Visit: Payer: 59 | Attending: Cardiovascular Disease | Admitting: Cardiovascular Disease

## 2022-05-20 ENCOUNTER — Encounter: Payer: Self-pay | Admitting: Cardiovascular Disease

## 2022-05-20 VITALS — BP 108/70 | HR 111 | Ht 64.5 in | Wt 266.8 lb

## 2022-05-20 DIAGNOSIS — Z992 Dependence on renal dialysis: Secondary | ICD-10-CM

## 2022-05-20 DIAGNOSIS — I110 Hypertensive heart disease with heart failure: Secondary | ICD-10-CM

## 2022-05-20 DIAGNOSIS — N186 End stage renal disease: Secondary | ICD-10-CM | POA: Diagnosis not present

## 2022-05-20 DIAGNOSIS — Z7985 Long-term (current) use of injectable non-insulin antidiabetic drugs: Secondary | ICD-10-CM | POA: Diagnosis not present

## 2022-05-20 DIAGNOSIS — E1151 Type 2 diabetes mellitus with diabetic peripheral angiopathy without gangrene: Secondary | ICD-10-CM | POA: Diagnosis not present

## 2022-05-20 DIAGNOSIS — J4489 Other specified chronic obstructive pulmonary disease: Secondary | ICD-10-CM | POA: Diagnosis not present

## 2022-05-20 DIAGNOSIS — I5032 Chronic diastolic (congestive) heart failure: Secondary | ICD-10-CM

## 2022-05-20 DIAGNOSIS — M86371 Chronic multifocal osteomyelitis, right ankle and foot: Secondary | ICD-10-CM | POA: Diagnosis not present

## 2022-05-20 DIAGNOSIS — G4733 Obstructive sleep apnea (adult) (pediatric): Secondary | ICD-10-CM | POA: Diagnosis not present

## 2022-05-20 DIAGNOSIS — I272 Pulmonary hypertension, unspecified: Secondary | ICD-10-CM

## 2022-05-20 DIAGNOSIS — Z9981 Dependence on supplemental oxygen: Secondary | ICD-10-CM | POA: Diagnosis not present

## 2022-05-20 DIAGNOSIS — I739 Peripheral vascular disease, unspecified: Secondary | ICD-10-CM

## 2022-05-20 DIAGNOSIS — I5033 Acute on chronic diastolic (congestive) heart failure: Secondary | ICD-10-CM

## 2022-05-20 DIAGNOSIS — E872 Acidosis, unspecified: Secondary | ICD-10-CM | POA: Diagnosis not present

## 2022-05-20 DIAGNOSIS — E1169 Type 2 diabetes mellitus with other specified complication: Secondary | ICD-10-CM | POA: Diagnosis not present

## 2022-05-20 DIAGNOSIS — E1122 Type 2 diabetes mellitus with diabetic chronic kidney disease: Secondary | ICD-10-CM | POA: Diagnosis not present

## 2022-05-20 DIAGNOSIS — I050 Rheumatic mitral stenosis: Secondary | ICD-10-CM | POA: Diagnosis not present

## 2022-05-20 DIAGNOSIS — I951 Orthostatic hypotension: Secondary | ICD-10-CM | POA: Diagnosis not present

## 2022-05-20 DIAGNOSIS — Z794 Long term (current) use of insulin: Secondary | ICD-10-CM

## 2022-05-20 DIAGNOSIS — I10 Essential (primary) hypertension: Secondary | ICD-10-CM

## 2022-05-20 DIAGNOSIS — J441 Chronic obstructive pulmonary disease with (acute) exacerbation: Secondary | ICD-10-CM | POA: Diagnosis not present

## 2022-05-20 DIAGNOSIS — I70203 Unspecified atherosclerosis of native arteries of extremities, bilateral legs: Secondary | ICD-10-CM | POA: Diagnosis not present

## 2022-05-20 DIAGNOSIS — D631 Anemia in chronic kidney disease: Secondary | ICD-10-CM | POA: Diagnosis not present

## 2022-05-20 DIAGNOSIS — J9612 Chronic respiratory failure with hypercapnia: Secondary | ICD-10-CM | POA: Diagnosis not present

## 2022-05-20 DIAGNOSIS — J9611 Chronic respiratory failure with hypoxia: Secondary | ICD-10-CM | POA: Diagnosis not present

## 2022-05-20 DIAGNOSIS — E11621 Type 2 diabetes mellitus with foot ulcer: Secondary | ICD-10-CM | POA: Diagnosis not present

## 2022-05-20 DIAGNOSIS — L97513 Non-pressure chronic ulcer of other part of right foot with necrosis of muscle: Secondary | ICD-10-CM | POA: Diagnosis not present

## 2022-05-20 DIAGNOSIS — E1165 Type 2 diabetes mellitus with hyperglycemia: Secondary | ICD-10-CM | POA: Diagnosis not present

## 2022-05-20 DIAGNOSIS — I132 Hypertensive heart and chronic kidney disease with heart failure and with stage 5 chronic kidney disease, or end stage renal disease: Secondary | ICD-10-CM | POA: Diagnosis not present

## 2022-05-20 NOTE — Patient Instructions (Addendum)
Medication Instructions:  No changes  Ask Dr. Candiss Norse about Torsemide on non-dialysis days  If you need a refill on your cardiac medications before your next appointment, please call your pharmacy.   Lab work: No new labs needed  Testing/Procedures: No new testing needed  Follow-Up: At [facility], you and your health needs are our priority.  As part of our continuing mission to provide you with exceptional heart care, we have created designated Provider Care Teams.  These Care Teams include your primary Cardiologist (physician) and Advanced Practice Providers (APPs -  Physician Assistants and Nurse Practitioners) who all work together to provide you with the care you need, when you need it.  You will need a follow up appointment in 12 months  Providers on your designated Care Team:   Murray Hodgkins, NP Christell Faith, PA-C Cadence Kathlen Mody, [place]  COVID-19 Vaccine Information can be found at: [email or web address] For questions related to vaccine distribution or appointments, please email [email or web address] or call [phone].

## 2022-05-21 DIAGNOSIS — N186 End stage renal disease: Secondary | ICD-10-CM | POA: Diagnosis not present

## 2022-05-21 DIAGNOSIS — M86171 Other acute osteomyelitis, right ankle and foot: Secondary | ICD-10-CM | POA: Diagnosis not present

## 2022-05-21 DIAGNOSIS — Z5181 Encounter for therapeutic drug level monitoring: Secondary | ICD-10-CM | POA: Diagnosis not present

## 2022-05-21 DIAGNOSIS — Z992 Dependence on renal dialysis: Secondary | ICD-10-CM | POA: Diagnosis not present

## 2022-05-21 DIAGNOSIS — Z792 Long term (current) use of antibiotics: Secondary | ICD-10-CM | POA: Diagnosis not present

## 2022-05-21 DIAGNOSIS — E8770 Fluid overload, unspecified: Secondary | ICD-10-CM | POA: Diagnosis not present

## 2022-05-21 NOTE — Progress Notes (Signed)
[name], [name] ([id number]) [id number] Page 1 of 7 Visit Report for 04/15/2022 Arrival Information Details Patient Name: Date of Service: [name], [name] 04/15/2022 3:00 PM Medical Record Number: [id number] Patient Account Number: [id number] Date of Birth/Sex: Treating RN: [birth year] (52 y.o. [name] Primary Care [name]: [name] Other Clinician: Referring [name]: [name] [name]/Extender: [name] in Treatment: 51 Visit Information History Since Last Visit Added or deleted any medications: No Patient Arrived: Wheel Chair Any new allergies or adverse reactions: No Arrival Time: 15:00 Had a fall or experienced change in No Accompanied By: husband activities of daily living that may affect Transfer Assistance: None risk of falls: Patient Identification Verified: Yes Signs or symptoms of abuse/neglect since last visito No Secondary Verification Process Completed: Yes Hospitalized since last visit: No Patient Requires Transmission-Based Precautions: No Implantable device outside of the clinic excluding No Patient Has Alerts: No cellular tissue based products placed in the center since last visit: Has Dressing in Place as Prescribed: Yes Pain Present Now: Yes Electronic Signature(s) Signed: 05/21/2022 8:43:41 AM By: [name] Coria RN Entered By: [name] on 04/15/2022 15:06:05 -------------------------------------------------------------------------------- Clinic Level of Care Assessment Details Patient Name: Date of Service: [name], [name] 04/15/2022 3:00 PM Medical Record Number: [id number] Patient Account Number: [id number] Date of Birth/Sex: Treating RN: [birth year] (52 y.o. [name] Primary Care Talayla Doyel: [name] Other Clinician: Referring [name] [name]/Extender: [name] in Treatment: 44 Clinic Level of Care Assessment Items TOOL 4 Quantity  Score X- 1 0 Use when only an EandM is performed on FOLLOW-UP visit ASSESSMENTS - Nursing Assessment / Reassessment X- 1 10 Reassessment of Co-morbidities (includes updates in patient status) X- 1 5 Reassessment of Adherence to Treatment Plan ASSESSMENTS - Wound and Skin A ssessment / Reassessment X - Simple Wound Assessment / Reassessment - one wound 1 5 []  - 0 Complex Wound Assessment / Reassessment - multiple wounds []  - 0 Dermatologic / Skin Assessment (not related to wound area) ASSESSMENTS - Focused Assessment []  - 0 Circumferential Edema Measurements - multi extremities []  - 0 Nutritional Assessment / Counseling / Intervention []  - 0 Lower Extremity Assessment (monofilament, tuning fork, pulses) []  - 0 Peripheral Arterial Disease Assessment (using hand held doppler) ASSESSMENTS - Ostomy and/or Continence Assessment and Care []  - 0 Incontinence Assessment and Management []  - 0 Ostomy Care Assessment and Management (repouching, etc.) PROCESS - Coordination of Care X - Simple Patient / Family Education for ongoing care 1 15 [name] ([id number]) [id number] Page 2 of 7 []  - 0 Complex (extensive) Patient / Family Education for ongoing care []  - 0 Staff obtains Programmer, systems, Records, T Results / Process Orders est []  - 0 Staff telephones HHA, Nursing Homes / Clarify orders / etc []  - 0 Routine Transfer to another Facility (non-emergent condition) []  - 0 Routine Hospital Admission (non-emergent condition) []  - 0 New Admissions / Biomedical engineer / Ordering NPWT Apligraf, etc. , []  - 0 Emergency Hospital Admission (emergent condition) X- 1 10 Simple Discharge Coordination []  - 0 Complex (extensive) Discharge Coordination PROCESS - Special Needs []  - 0 Pediatric / Minor Patient Management []  - 0 Isolation Patient Management []  - 0 Hearing / Language / Visual special needs []  - 0 Assessment of Community assistance  (transportation, D/C planning, etc.) []  - 0 Additional assistance / Altered mentation []  - 0 Support Surface(s) Assessment (bed, cushion, seat, etc.) INTERVENTIONS - Wound Cleansing / Measurement X - Simple Wound Cleansing -  one wound 1 5 []  - 0 Complex Wound Cleansing - multiple wounds X- 1 5 Wound Imaging (photographs - any number of wounds) []  - 0 Wound Tracing (instead of photographs) X- 1 5 Simple Wound Measurement - one wound []  - 0 Complex Wound Measurement - multiple wounds INTERVENTIONS - Wound Dressings X - Small Wound Dressing one or multiple wounds 1 10 []  - 0 Medium Wound Dressing one or multiple wounds []  - 0 Large Wound Dressing one or multiple wounds []  - 0 Application of Medications - topical []  - 0 Application of Medications - injection INTERVENTIONS - Miscellaneous []  - 0 External ear exam []  - 0 Specimen Collection (cultures, biopsies, blood, body fluids, etc.) []  - 0 Specimen(s) / Culture(s) sent or taken to Lab for analysis []  - 0 Patient Transfer (multiple staff / Civil Service fast streamer / Similar devices) []  - 0 Simple Staple / Suture removal (25 or less) []  - 0 Complex Staple / Suture removal (26 or more) []  - 0 Hypo / Hyperglycemic Management (close monitor of Blood Glucose) []  - 0 Ankle / Brachial Index (ABI) - do not check if billed separately X- 1 5 Vital Signs Has the patient been seen at the hospital within the last three years: Yes Total Score: 75 Level Of Care: New/Established - Level 2 Electronic Signature(s) Signed: 05/21/2022 8:43:41 AM By: [name] Coria RN Entered By: [name] on 04/15/2022 15:44:54 [name], [name] ([id number]) [id number] Page 3 of 7 -------------------------------------------------------------------------------- Encounter Discharge Information Details Patient Name: Date of Service: [name], [name] 04/15/2022 3:00 PM Medical Record Number: [id number] Patient Account Number: [id number] Date  of Birth/Sex: Treating RN: 01-22-1971 (52 y.o. [name] Primary Care [name]: [name] Other Clinician: Referring [name]: [name] in Treatment: 28 Encounter Discharge Information Items Discharge Condition: Stable Ambulatory Status: Ambulatory Discharge Destination: Home Transportation: Private Auto Accompanied By: husband Schedule Follow-up Appointment: Yes Clinical Summary of Care: Patient Declined Electronic Signature(s) Signed: 05/21/2022 8:43:41 AM By: [name] Coria RN Entered By: [name] on 04/15/2022 15:51:40 -------------------------------------------------------------------------------- Lower Extremity Assessment Details Patient Name: Date of Service: [place], [name] 04/15/2022 3:00 PM Medical Record Number: [id number] Patient Account Number: [id number] Date of Birth/Sex: Treating RN: 31-Dec-1970 (52 y.o. [name] Primary Care Jahfari Ambers: [name] Other Clinician: Referring Shelma Eiben: [name] Jasline Buskirk/Extender: [name] in Treatment: 44 Electronic Signature(s) Signed: 05/21/2022 8:43:41 AM By: [name] Coria RN Entered By: [name] on 04/15/2022 15:16:52 -------------------------------------------------------------------------------- Multi Wound Chart Details Patient Name: Date of Service: [place], [name] 04/15/2022 3:00 PM Medical Record Number: [id number] Patient Account Number: [id number] Date of Birth/Sex: Treating RN: [birth year] (52 y.o. [name] Primary Care Khyra Viscuso: [name] Other Clinician: Referring Rigoberto Repass: [name] Anthany Thornhill/Extender: [name] in Treatment: 7 Vital Signs Height(in): Pulse(bpm): 106 Weight(lbs): Blood Pressure(mmHg): 82/42 Body Mass Index(BMI): Temperature(F): 98.2 Respiratory Rate(breaths/min): 16 [2:Photos:] [N/A:N/A] [name], [name] ([id number]) [2:Right, Circumferential Foot  Wound Location: Gradually Appeared Wounding Event: Diabetic Wound/Ulcer of the Lower Primary Etiology: Extremity Cataracts, Anemia, Asthma, Chronic Comorbid History: Obstructive Pulmonary Disease (COPD), Congestive Heart  Failure, Hypertension, Type II Diabetes, End Stage Renal Disease, Osteoarthritis, Neuropathy 10/15/2020 Date Acquired: 48 [name] of Treatment: Open Wound Status: No Wound Recurrence: Yes Pending A mputation on Presentation: 12.5x8x0.2 Measurements L x W x  D (cm) 78.54 A (cm) : rea 15.708 Volume (cm) : 25.90% % Reduction in A rea: 92.60% % Reduction in Volume: Grade 4 Classification: Medium Exudate A mount:  Serosanguineous Exudate Type: red, brown Exudate Color: Medium (34-66%) Granulation A mount:  Medium (34-66%) Necrotic A mount: Eschar, Necrosis of Bone Necrotic Tissue: Fat Layer (Subcutaneous Tissue): Yes N/A Exposed Structures: Fascia: No Tendon: No Muscle: No Joint: No Bone: No] [N/A:N/A N/A N/A N/A N/A N/A N/A N/A N/A N/A N/A N/A N/A N/A N/A  N/A N/A N/A N/A N/A N/A] Treatment Notes Electronic Signature(s) Signed: 05/21/2022 8:43:41 AM By: [name] Coria RN Entered By: [name] on 04/15/2022 15:16:56 -------------------------------------------------------------------------------- Multi-Disciplinary Care Plan Details Patient Name: Date of Service: [place], [name] 04/15/2022 3:00 PM Medical Record Number: [id number] Patient Account Number: [id number] Date of Birth/Sex: Treating RN: 10/23/1970 (52 y.o. [name] Primary Care Robbi Scurlock: [name] Other Clinician: Referring Ulysses Alper: [name] Diezel Mazur/Extender: [name] in Treatment: 88 Active Inactive Wound/Skin Impairment Nursing Diagnoses: Impaired tissue integrity Knowledge deficit related to ulceration/compromised skin integrity Goals: Ulcer/skin breakdown will have a volume reduction of 30% by week 4 Date Initiated: 05/18/2021 Date Inactivated: 04/15/2022 Target Resolution  Date: 06/15/2021 Goal Status: Unmet Unmet Reason: comorbidities Ulcer/skin breakdown will have a volume reduction of 50% by week 8 Date Initiated: 05/18/2021 Target Resolution Date: 07/13/2021 Goal Status: Active Ulcer/skin breakdown will have a volume reduction of 80% by week 12 Date Initiated: 05/18/2021 Target Resolution Date: 08/10/2021 Goal Status: Active Ulcer/skin breakdown will heal within 14 [name] Date Initiated: 05/18/2021 Target Resolution Date: 08/24/2021 Goal Status: Active Interventions: [name], [name] ([id number]) [id number] Page 5 of 7 Assess patient/caregiver ability to obtain necessary supplies Assess patient/caregiver ability to perform ulcer/skin care regimen upon admission and as needed Assess ulceration(s) every visit Provide education on ulcer and skin care Notes: Electronic Signature(s) Signed: 04/15/2022 3:26:46 PM By: [name] Coria RN Entered By: [name] on 04/15/2022 15:26:46 -------------------------------------------------------------------------------- Pain Assessment Details Patient Name: Date of Service: [place], [name] 04/15/2022 3:00 PM Medical Record Number: [id number] Patient Account Number: [id number] Date of Birth/Sex: Treating RN: Jul 17, 1970 (52 y.o. [name] Primary Care Kelia Gibbon: [name] Other Clinician: Referring Mariana Wiederholt: [name] Ayris Carano/Extender: [name] in Treatment: 52 Active Problems Location of Pain Severity and Description of Pain Patient Has Paino Yes Site Locations With Dressing Change: Yes Duration of the Pain. Constant / Intermittento Intermittent How Long Does it Lasto Hours: Minutes: 15 Rate the pain. Current Pain Level: 4 Worst Pain Level: 7 Least Pain Level: 0 Character of Pain Describe the Pain: Aching, Throbbing Pain Management and Medication Current Pain Management: Medication: Yes Cold Application: No Rest: Yes Massage: No Activity:  No T.E.N.S.: No Heat Application: No Leg drop or elevation: No Is the Current Pain Management Adequate: Inadequate How does your wound impact your activities of daily livingo Sleep: Yes Bathing: No Appetite: Yes Relationship With Others: No Bladder Continence: No Emotions: No Bowel Continence: No Work: No Toileting: No Drive: No Dressing: No Hobbies: No Electronic Signature(s) Signed: 05/21/2022 8:43:41 AM By: [name] Coria RN Entered By: [name] on 04/15/2022 15:08:41 [name], [name] ([id number]) [id number] Page 6 of 7 -------------------------------------------------------------------------------- Patient/Caregiver Education Details Patient Name: Date of Service: [place], [name] 2/19/2024andnbsp3:00 PM Medical Record Number: [id number] Patient Account Number: [id number] Date of Birth/Gender: Treating RN: [birth year] (52 y.o. [name] Primary Care Physician: [name] Other Clinician: Referring Physician: Treating Physician/Extender: [name] in Treatment: 48 Education Assessment Education Provided To: Patient Education Topics Provided Wound/Skin Impairment: Methods: Explain/Verbal Responses: State content correctly Electronic Signature(s) Signed: 05/21/2022 8:43:41 AM By: [name] Coria RN Entered By: Dolores Lory  Carrie on 04/15/2022 15:19:02 -------------------------------------------------------------------------------- Wound Assessment Details Patient Name: Date of Service: [place], [name] 04/15/2022 3:00 PM Medical Record Number: [id number] Patient Account Number: [id number] Date of Birth/Sex: Treating RN: Aug 18, 1970 (52 y.o. [name] Primary Care Derry Arbogast: [name] Other Clinician: Referring Carman Essick: [name] Meerab Maselli/Extender: [name] in Treatment: 47 Wound Status Wound Number: 2 Primary Diabetic Wound/Ulcer of the Lower Extremity Etiology: Wound  Location: Right, Circumferential Foot Wound Open Wounding Event: Gradually Appeared Status: Date Acquired: 10/15/2020 Notes: open areas to midline right foot, lateral right foot, and top of right [name] Of Treatment: 47 foot Clustered Wound: No Comorbid Cataracts, Anemia, Asthma, Chronic Obstructive Pulmonary Pending Amputation On Presentation History: Disease (COPD), Congestive Heart Failure, Hypertension, Type II Diabetes, End Stage Renal Disease, Osteoarthritis, Neuropathy Photos Wound Measurements Length: (cm) 12.5 Width: (cm) 8 Depth: (cm) 0.2 Area: (cm) 78.54 Volume: (cm) 15.708 % Reduction in Area: 25.9% % Reduction in Volume: 92.6% Tunneling: No Undermining: No Wound Description Classification: Grade 4 Exudate Amount: Medium Exudate Type: Serosanguineous [name], [name] ([id number]) Exudate Color: red, brown Foul Odor After Cleansing: No Slough/Fibrino Yes [id number] Page 7 of 7 Wound Bed Granulation Amount: Medium (34-66%) Exposed Structure Necrotic Amount: Medium (34-66%) Fascia Exposed: No Necrotic Quality: Eschar, Bone Fat Layer (Subcutaneous Tissue) Exposed: Yes Tendon Exposed: No Muscle Exposed: No Joint Exposed: No Bone Exposed: No Electronic Signature(s) Signed: 05/21/2022 8:43:41 AM By: [name] Coria RN Entered By: [name] on 04/15/2022 15:16:30 -------------------------------------------------------------------------------- Vitals Details Patient Name: Date of Service: [place], [name] 04/15/2022 3:00 PM Medical Record Number: [id number] Patient Account Number: [id number] Date of Birth/Sex: Treating RN: [birth year] (52 y.o. [name] Primary Care Florian Chauca: [name] Other Clinician: Referring Jakari Jacot: [name] Deyjah Kindel/Extender: [name] in Treatment: 24 Vital Signs Time Taken: 15:05 Temperature (F): 98.2 Pulse (bpm): 106 Respiratory Rate (breaths/min): 16 Blood Pressure  (mmHg): 82/42 Reference Range: 80 - 120 mg / dl Electronic Signature(s) Signed: 05/21/2022 8:43:41 AM By: [name] Coria RN Entered By: [name] on 04/15/2022 15:07:53

## 2022-05-21 NOTE — Progress Notes (Signed)
[name] ([id number]) [id number] Page 1 of 6 Visit Report for 05/17/2022 Arrival Information Details Patient Name: Date of Service: [name], [name] RLA M. 05/17/2022 12:00 PM Medical Record Number: [id number] Patient Account Number: [id number] Date of Birth/Sex: Treating RN: June 03, 1970 (52 y.o. [name] Primary Care Omair Dettmer: Evern Bio Other Clinician: Massie Kluver Referring Burlon Centrella: Treating [name]/Extender: [name] Weeks in Treatment: 90 Visit Information History Since Last Visit All ordered tests and consults were completed: No Patient Arrived: Wheel Chair Added or deleted any medications: No Arrival Time: 12:22 Any new allergies or adverse reactions: No Transfer Assistance: EasyPivot Patient Lift Had a fall or experienced change in No Patient Identification Verified: Yes activities of daily living that may affect Secondary Verification Process Completed: Yes risk of falls: Patient Requires Transmission-Based Precautions: No Signs or symptoms of abuse/neglect since last visito No Patient Has Alerts: No Hospitalized since last visit: No Implantable device outside of the clinic excluding No cellular tissue based products placed in the center since last visit: Has Dressing in Place as Prescribed: Yes Pain Present Now: No Electronic Signature(s) Signed: 05/20/2022 5:19:29 PM By: Massie Kluver Entered By: Massie Kluver on 05/17/2022 12:29:20 -------------------------------------------------------------------------------- Clinic Level of Care Assessment Details Patient Name: Date of Service: [name], [name] RLA M. 05/17/2022 12:00 PM Medical Record Number: [id number] Patient Account Number: [id number] Date of Birth/Sex: Treating RN: 1970-06-12 (52 y.o. [name] Primary Care Adalbert [name]: Evern Bio Other Clinician: Massie Kluver Referring Aidian [name]: Treating [name]/Extender: [name] Weeks  in Treatment: 20 Clinic Level of Care Assessment Items TOOL 1 Quantity Score []  - 0 Use when EandM and Procedure is performed on INITIAL visit ASSESSMENTS - Nursing Assessment / Reassessment []  - 0 General Physical Exam (combine w/ comprehensive assessment (listed just below) when performed on new pt. evals) []  - 0 Comprehensive Assessment (HX, ROS, Risk Assessments, Wounds Hx, etc.) ASSESSMENTS - Wound and Skin Assessment / Reassessment []  - 0 Dermatologic / Skin Assessment (not related to wound area) ASSESSMENTS - Ostomy and/or Continence Assessment and Care []  - 0 Incontinence Assessment and Management []  - 0 Ostomy Care Assessment and Management (repouching, etc.) PROCESS - Coordination of Care []  - 0 Simple Patient / Family Education for ongoing care []  - 0 Complex (extensive) Patient / Family Education for ongoing care []  - 0 Staff obtains Programmer, systems, Records, T Results / Process Orders est []  - 0 Staff telephones HHA, Nursing Homes / Clarify orders / etc []  - 0 Routine Transfer to another Facility (non-emergent condition) []  - 0 Routine Hospital Admission (non-emergent condition) [name] ([id number]) [id number] Page 2 of 6 []  - 0 New Admissions / Biomedical engineer / Ordering NPWT Apligraf, etc. , []  - 0 Emergency Hospital Admission (emergent condition) PROCESS - Special Needs []  - 0 Pediatric / Minor Patient Management []  - 0 Isolation Patient Management []  - 0 Hearing / Language / Visual special needs []  - 0 Assessment of Community assistance (transportation, D/C planning, etc.) []  - 0 Additional assistance / Altered mentation []  - 0 Support Surface(s) Assessment (bed, cushion, seat, etc.) INTERVENTIONS - Miscellaneous []  - 0 External ear exam []  - 0 Patient Transfer (multiple staff / Civil Service fast streamer / Similar devices) []  - 0 Simple Staple / Suture removal (25 or less) []  - 0 Complex Staple / Suture removal (26 or  more) []  - 0 Hypo/Hyperglycemic Management (do not check if billed separately) []  - 0 Ankle / Brachial Index (ABI) - do not check if billed separately Has  the patient been seen at the hospital within the last three years: Yes Total Score: 0 Level Of Care: ____ Electronic Signature(s) Signed: 05/20/2022 5:19:29 PM By: Massie Kluver Entered By: Massie Kluver on 05/17/2022 13:11:09 -------------------------------------------------------------------------------- Lower Extremity Assessment Details Patient Name: Date of Service: [name], [name] RLA M. 05/17/2022 12:00 PM Medical Record Number: [id number] Patient Account Number: [id number] Date of Birth/Sex: Treating RN: November 29, 1970 (52 y.o. [name] Primary Care Ollie [name]: Evern Bio Other Clinician: Massie Kluver Referring Linn Clavin: Treating [name] Senne/Extender: [name] Weeks in Treatment: 55 Electronic Signature(s) Signed: 05/20/2022 4:47:10 PM By: Gretta Cool BSN, RN, CWS, Kim RN, BSN Signed: 05/20/2022 5:19:29 PM By: Massie Kluver Entered By: Massie Kluver on 05/17/2022 12:48:59 -------------------------------------------------------------------------------- Multi Wound Chart Details Patient Name: Date of Service: [name], [name] RLA M. 05/17/2022 12:00 PM Medical Record Number: [id number] Patient Account Number: [id number] Date of Birth/Sex: Treating RN: Apr 03, 1970 (52 y.o. [name] Primary Care [name]: Evern Bio Other Clinician: Massie Kluver Referring [name]: Treating [name]/Extender: [name] Weeks in Treatment: 54 Vital Signs Height(in): Pulse(bpm): 112 Weight(lbs): Blood Pressure(mmHg): 91/60 Body Mass Index(BMI): Temperature(F): 98.3 Respiratory Rate(breaths/min): 18 [2:Photos:] [N/A:N/A] Right, Circumferential Foot N/A N/A Wound Location: Gradually Appeared N/A N/A Wounding Event: Diabetic Wound/Ulcer of the Lower N/A N/A Primary  Etiology: Extremity Cataracts, Anemia, Asthma, Chronic N/A N/A Comorbid History: Obstructive Pulmonary Disease (COPD), Congestive Heart Failure, Hypertension, Type II Diabetes, End Stage Renal Disease, Osteoarthritis, Neuropathy 10/15/2020 N/A N/A Date Acquired: 48 N/A N/A Weeks of Treatment: Open N/A N/A Wound Status: No N/A N/A Wound Recurrence: Yes N/A N/A Pending A mputation on Presentation: 13x6.2x0.2 N/A N/A Measurements L x W x D (cm) 63.303 N/A N/A A (cm) : rea 12.661 N/A N/A Volume (cm) : 40.30% N/A N/A % Reduction in A rea: 94.00% N/A N/A % Reduction in Volume: Grade 4 N/A N/A Classification: Medium N/A N/A Exudate A mount: Serosanguineous N/A N/A Exudate Type: red, brown N/A N/A Exudate Color: Medium (34-66%) N/A N/A Granulation A mount: Medium (34-66%) N/A N/A Necrotic A mount: Eschar, Necrosis of Bone N/A N/A Necrotic Tissue: Fat Layer (Subcutaneous Tissue): Yes N/A N/A Exposed Structures: Fascia: No Tendon: No Muscle: No Joint: No Bone: No Treatment Notes Electronic Signature(s) Signed: 05/20/2022 5:19:29 PM By: Massie Kluver Entered By: Massie Kluver on 05/17/2022 12:49:06 -------------------------------------------------------------------------------- Multi-Disciplinary Care Plan Details Patient Name: Date of Service: [place], [name] RLA M. 05/17/2022 12:00 PM Medical Record Number: [id number] Patient Account Number: [id number] Date of Birth/Sex: Treating RN: Apr 12, 1970 (52 y.o. [name] Primary Care Franklin Clapsaddle: Evern Bio Other Clinician: Massie Kluver Referring Tannia Contino: Treating Vonya Ohalloran/Extender: [name] Weeks in Treatment: 88 Active Inactive Wound/Skin Impairment Nursing Diagnoses: Impaired tissue integrity [place], Richmond [name] ([id number]) [id number] Page 4 of 6 Knowledge deficit related to ulceration/compromised skin integrity Goals: Ulcer/skin breakdown will have a volume  reduction of 30% by week 4 Date Initiated: 05/18/2021 Date Inactivated: 04/15/2022 Target Resolution Date: 06/15/2021 Goal Status: Unmet Unmet Reason: comorbidities Ulcer/skin breakdown will have a volume reduction of 50% by week 8 Date Initiated: 05/18/2021 Target Resolution Date: 07/13/2021 Goal Status: Active Ulcer/skin breakdown will have a volume reduction of 80% by week 12 Date Initiated: 05/18/2021 Target Resolution Date: 08/10/2021 Goal Status: Active Ulcer/skin breakdown will heal within 14 weeks Date Initiated: 05/18/2021 Target Resolution Date: 08/24/2021 Goal Status: Active Interventions: Assess patient/caregiver ability to obtain necessary supplies Assess patient/caregiver ability to perform ulcer/skin care regimen upon admission and as needed Assess ulceration(s) every visit Provide education on ulcer and skin care Notes:  Electronic Signature(s) Signed: 05/20/2022 4:47:10 PM By: Gretta Cool, BSN, RN, CWS, Kim RN, BSN Signed: 05/20/2022 5:19:29 PM By: Massie Kluver Entered By: Massie Kluver on 05/17/2022 13:26:20 -------------------------------------------------------------------------------- Pain Assessment Details Patient Name: Date of Service: [name], [name] RLA M. 05/17/2022 12:00 PM Medical Record Number: [id number] Patient Account Number: [id number] Date of Birth/Sex: Treating RN: 19-Aug-1970 (52 y.o. [name] Primary Care Shad Ledvina: Evern Bio Other Clinician: Massie Kluver Referring Akif Weldy: Treating Wilberto Console/Extender: [name] Weeks in Treatment: 64 Active Problems Location of Pain Severity and Description of Pain Patient Has Paino No Site Locations Pain Management and Medication Current Pain Management: Electronic Signature(s) Signed: 05/20/2022 4:47:10 PM By: Gretta Cool, BSN, RN, CWS, Kim RN, BSN Signed: 05/20/2022 5:19:29 PM By: Massie Kluver Entered By: Massie Kluver on 05/17/2022 12:40:36 [name], Richmond [name] ([id number])  [id number] Page 5 of 6 -------------------------------------------------------------------------------- Wound Assessment Details Patient Name: Date of Service: [name], [name] RLA M. 05/17/2022 12:00 PM Medical Record Number: [id number] Patient Account Number: [id number] Date of Birth/Sex: Treating RN: Apr 21, 1970 (52 y.o. [name] Primary Care Kammi Hechler: Evern Bio Other Clinician: Massie Kluver Referring Zuriah Bordas: Treating Farhana Fellows/Extender: [name] Weeks in Treatment: 49 Wound Status Wound Number: 2 Primary Diabetic Wound/Ulcer of the Lower Extremity Etiology: Wound Location: Right, Circumferential Foot Wound Open Wounding Event: Gradually Appeared Status: Date Acquired: 10/15/2020 Notes: open areas to midline right foot, lateral right foot, and top of right Weeks Of Treatment: 52 foot Clustered Wound: No Comorbid Cataracts, Anemia, Asthma, Chronic Obstructive Pulmonary Pending Amputation On Presentation History: Disease (COPD), Congestive Heart Failure, Hypertension, Type II Diabetes, End Stage Renal Disease, Osteoarthritis, Neuropathy Photos Wound Measurements Length: (cm) 13 Width: (cm) 6.2 Depth: (cm) 0.2 Area: (cm) 63.303 Volume: (cm) 12.661 % Reduction in Area: 40.3% % Reduction in Volume: 94% Tunneling: No Undermining: No Wound Description Classification: Grade 4 Exudate Amount: Medium Exudate Type: Serosanguineous Exudate Color: red, brown Foul Odor After Cleansing: No Slough/Fibrino Yes Wound Bed Granulation Amount: Medium (34-66%) Exposed Structure Necrotic Amount: Medium (34-66%) Fascia Exposed: No Necrotic Quality: Eschar, Bone Fat Layer (Subcutaneous Tissue) Exposed: Yes Tendon Exposed: No Muscle Exposed: No Joint Exposed: No Bone Exposed: No Electronic Signature(s) Signed: 05/20/2022 4:47:10 PM By: Gretta Cool, BSN, RN, CWS, Kim RN, BSN Signed: 05/20/2022 5:19:29 PM By: Massie Kluver Entered By:  Massie Kluver on 05/17/2022 12:48:41 -------------------------------------------------------------------------------- [place] Details Patient Name: Date of Service: [name], [name] RLA M. 05/17/2022 12:00 PM Medical Record Number: [id number] Patient Account Number: [id number] Date of Birth/Sex: Treating RN: 1970/07/19 (52 y.o. [name] Primary Care Mckenna Gamm: Evern Bio Other Clinician: Massie Kluver Referring Stepan Verrette: Treating Sherrill Buikema/Extender: [name] Weeks in Treatment: [address], [place] ([id number]) [id number] Page 6 of 6 Vital Signs Time Taken: 12:29 Temperature (F): 98.3 Pulse (bpm): 112 Respiratory Rate (breaths/min): 18 Blood Pressure (mmHg): 91/60 Reference Range: 80 - 120 mg / dl Airway Pulse Oximetry (%): 93 Inhaled Oxygen Concentration (%): 3 Electronic Signature(s) Signed: 05/20/2022 5:19:29 PM By: Massie Kluver Entered By: Massie Kluver on 05/17/2022 12:40:30

## 2022-05-22 ENCOUNTER — Ambulatory Visit (INDEPENDENT_AMBULATORY_CARE_PROVIDER_SITE_OTHER): Payer: 59 | Admitting: Nurse Practitioner

## 2022-05-22 ENCOUNTER — Encounter: Payer: Self-pay | Admitting: Nurse Practitioner

## 2022-05-22 VITALS — BP 110/80 | HR 106 | Ht 64.5 in | Wt 266.1 lb

## 2022-05-22 DIAGNOSIS — G4700 Insomnia, unspecified: Secondary | ICD-10-CM

## 2022-05-22 DIAGNOSIS — I132 Hypertensive heart and chronic kidney disease with heart failure and with stage 5 chronic kidney disease, or end stage renal disease: Secondary | ICD-10-CM | POA: Diagnosis not present

## 2022-05-22 DIAGNOSIS — E872 Acidosis, unspecified: Secondary | ICD-10-CM | POA: Diagnosis not present

## 2022-05-22 DIAGNOSIS — Z9981 Dependence on supplemental oxygen: Secondary | ICD-10-CM | POA: Diagnosis not present

## 2022-05-22 DIAGNOSIS — N186 End stage renal disease: Secondary | ICD-10-CM

## 2022-05-22 DIAGNOSIS — E1151 Type 2 diabetes mellitus with diabetic peripheral angiopathy without gangrene: Secondary | ICD-10-CM | POA: Diagnosis not present

## 2022-05-22 DIAGNOSIS — I5033 Acute on chronic diastolic (congestive) heart failure: Secondary | ICD-10-CM

## 2022-05-22 DIAGNOSIS — E1122 Type 2 diabetes mellitus with diabetic chronic kidney disease: Secondary | ICD-10-CM | POA: Diagnosis not present

## 2022-05-22 DIAGNOSIS — M86371 Chronic multifocal osteomyelitis, right ankle and foot: Secondary | ICD-10-CM | POA: Diagnosis not present

## 2022-05-22 DIAGNOSIS — I951 Orthostatic hypotension: Secondary | ICD-10-CM | POA: Diagnosis not present

## 2022-05-22 DIAGNOSIS — J4489 Other specified chronic obstructive pulmonary disease: Secondary | ICD-10-CM

## 2022-05-22 DIAGNOSIS — L97513 Non-pressure chronic ulcer of other part of right foot with necrosis of muscle: Secondary | ICD-10-CM | POA: Diagnosis not present

## 2022-05-22 DIAGNOSIS — Z992 Dependence on renal dialysis: Secondary | ICD-10-CM

## 2022-05-22 DIAGNOSIS — Z7985 Long-term (current) use of injectable non-insulin antidiabetic drugs: Secondary | ICD-10-CM | POA: Diagnosis not present

## 2022-05-22 DIAGNOSIS — J441 Chronic obstructive pulmonary disease with (acute) exacerbation: Secondary | ICD-10-CM | POA: Diagnosis not present

## 2022-05-22 DIAGNOSIS — E1169 Type 2 diabetes mellitus with other specified complication: Secondary | ICD-10-CM | POA: Diagnosis not present

## 2022-05-22 DIAGNOSIS — J9611 Chronic respiratory failure with hypoxia: Secondary | ICD-10-CM | POA: Diagnosis not present

## 2022-05-22 DIAGNOSIS — D631 Anemia in chronic kidney disease: Secondary | ICD-10-CM | POA: Diagnosis not present

## 2022-05-22 DIAGNOSIS — J301 Allergic rhinitis due to pollen: Secondary | ICD-10-CM | POA: Insufficient documentation

## 2022-05-22 DIAGNOSIS — J9612 Chronic respiratory failure with hypercapnia: Secondary | ICD-10-CM | POA: Diagnosis not present

## 2022-05-22 DIAGNOSIS — E11621 Type 2 diabetes mellitus with foot ulcer: Secondary | ICD-10-CM | POA: Diagnosis not present

## 2022-05-22 DIAGNOSIS — I5032 Chronic diastolic (congestive) heart failure: Secondary | ICD-10-CM | POA: Diagnosis not present

## 2022-05-22 DIAGNOSIS — I70203 Unspecified atherosclerosis of native arteries of extremities, bilateral legs: Secondary | ICD-10-CM | POA: Diagnosis not present

## 2022-05-22 DIAGNOSIS — E1165 Type 2 diabetes mellitus with hyperglycemia: Secondary | ICD-10-CM | POA: Diagnosis not present

## 2022-05-22 DIAGNOSIS — Z794 Long term (current) use of insulin: Secondary | ICD-10-CM | POA: Diagnosis not present

## 2022-05-22 MED ORDER — BENZONATATE 100 MG PO CAPS: 100.0000 mg | ORAL_CAPSULE | Freq: Three times a day (TID) | ORAL | 1 refills | Status: DC | PRN

## 2022-05-22 NOTE — Patient Instructions (Signed)
1) Paperwork completed and nursing will fax 2) Pt will My Chart message this provider once she starts new meds from last month's visit 3) Pt will call back for follow up visit

## 2022-05-22 NOTE — Progress Notes (Signed)
Established Patient Office Visit  Subjective:  Patient ID: [name], female    DOB: 1970-03-22  Age: 52 y.o. MRN: [id number]  Chief Complaint  Patient presents with   Follow-up    4 week follow up    1 month follow up.  Patient reports she continues to have intermittent nonproductive cough so will send in tessalon perles for cough control.  She has not started two of her new meds from her last visit due to her acute illness but will send a MyChart message when she starts them if they are ineffective.  Needs her completed paperwork faxed via nursing.    No other concerns at this time.   Past Medical History:  Diagnosis Date   (HFpEF) heart failure with preserved ejection fraction ([place])    a. 11/2014 Echo: EF 50-55%, Gr1 DD; b. 06/2016 Echo: EF 60-65%, no rwma, mod dil LA, nl RV, triv eff.   Anxiety    Asthma    Bronchitis    CHF (congestive heart failure) (HCC)    Chronic cough    Chronic respiratory failure with hypoxia and hypercapnia ([place])    a. Home O2.  (2-4L)   CKD (chronic kidney disease), stage III (HCC)    Depression    Diabetes mellitus without complication (HCC)    Diverticulosis    Dyspnea    Dysrhythmia    per pt, no arrythmias   Environmental and seasonal allergies    Extreme obesity with alveolar hypoventilation (HCC)    Hypertension    Iron deficiency anemia    a. chronic blood loss->heavy menses.   Kidney failure    dialysis tues/thur/sat   ... left arm shunt   Orthopnea    Oxygen dependent    3l/min   Pericardial effusion    a. 11/2014 CT Chest: Mod effusion; b. 11/2014 Echo: small to mod circumferential effusion. No hemodynamic compromise.   Pneumonia    history   Poorly controlled diabetes mellitus ([place])    a. 11/2014 A1c 13.2.   Swelling of both lower extremities     Past Surgical History:  Procedure Laterality Date   A/V FISTULAGRAM Left 06/09/2017   Procedure: A/V FISTULAGRAM;  Surgeon: Algernon Huxley, MD;  Location: [facility];  Service: Cardiovascular;  Laterality: Left;   A/V FISTULAGRAM Left 06/16/2017   Procedure: A/V FISTULAGRAM;  Surgeon: Algernon Huxley, MD;  Location: [facility];  Service: Cardiovascular;  Laterality: Left;   A/V FISTULAGRAM Left 01/28/2018   Procedure: A/V FISTULAGRAM;  Surgeon: Algernon Huxley, MD;  Location: [facility];  Service: Cardiovascular;  Laterality: Left;   AMPUTATION TOE Right 10/21/2020   Procedure: AMPUTATION TOE-5th Digit Amputation Right Foot;  Surgeon: Edrick Kins, DPM;  Location: [facility];  Service: Podiatry;  Laterality: Right;   AV FISTULA PLACEMENT Left 04/25/2017   Procedure: ARTERIOVENOUS (AV) FISTULA CREATION;  Surgeon: Algernon Huxley, MD;  Location: [facility];  Service: Vascular;  Laterality: Left;   BONE BIOPSY Right 04/27/2021   Procedure: Right 4th metatarsal BONE BIOPSY;  Surgeon: [name], DPM;  Location: [facility];  Service: Podiatry;  Laterality: Right;   CATARACT EXTRACTION W/PHACO Left 01/21/2018   Procedure: CATARACT EXTRACTION PHACO AND INTRAOCULAR LENS PLACEMENT (IOC);  Surgeon: Leandrew Koyanagi, MD;  Location: [facility];  Service: Ophthalmology;  Laterality: Left;  [place] 01:55.5 CDE 9.05 EAUP 13.9 Fluid Pack Lot # [id number]   CESAREAN SECTION     times 3  DIALYSIS/PERMA CATHETER INSERTION Right 04/04/2017   Procedure: DIALYSIS/PERMA CATHETER INSERTION;  Surgeon: Katha Cabal, MD;  Location: [facility];  Service: Cardiovascular;  Laterality: Right;   DIALYSIS/PERMA CATHETER REMOVAL N/A 09/17/2017   Procedure: DIALYSIS/PERMA CATHETER REMOVAL;  Surgeon: Algernon Huxley, MD;  Location: [facility];  Service: Cardiovascular;  Laterality: N/A;   INCISION AND DRAINAGE Right 10/26/2020   Procedure: INCISION AND DRAINAGE;  Surgeon: [name], DPM;  Location: [facility];  Service: Podiatry;  Laterality: Right;   INSERTION OF DIALYSIS CATHETER N/A 04/16/2017   Procedure: INSERTION OF DIALYSIS PERM CATHETER;  Surgeon: Algernon Huxley, MD;  Location: [facility];  Service: Vascular;  Laterality: N/A;   LOWER EXTREMITY ANGIOGRAPHY Right 10/18/2020   Procedure: Lower Extremity Angiography;  Surgeon: Algernon Huxley, MD;  Location: [facility];  Service: Cardiovascular;  Laterality: Right;   LOWER EXTREMITY ANGIOGRAPHY Right 04/17/2022   Procedure: Lower Extremity Angiography;  Surgeon: Algernon Huxley, MD;  Location: [facility];  Service: Cardiovascular;  Laterality: Right;    Social History   Socioeconomic History   Marital status: Married    Spouse name: abjul   Number of children: Not on file   Years of education: 12   Highest education level: High school graduate  Occupational History   Occupation: Disabled  Tobacco Use   Smoking status: Never    Passive exposure: Past   Smokeless tobacco: Never  Vaping Use   Vaping Use: Never used  Substance and Sexual Activity   Alcohol use: Not Currently    Comment: occasional   Drug use: Yes    Types: Marijuana   Sexual activity: Not Currently    Birth control/protection: None  Other Topics Concern   Not on file  Social History Narrative   Lives in [place] with her husband and 14 y/o child.  Three other children are grown and out of the house.  She is sedentary and does not routinely exercise.  On home O2.   Social Determinants of Health   Financial Resource Strain: High Risk (03/24/2017)   Overall Financial Resource Strain (CARDIA)    Difficulty of Paying Living Expenses: Very hard  Food Insecurity: No Food Insecurity (04/16/2022)   Hunger Vital Sign    Worried About Running Out of Food in the Last Year: Never true    Ran Out of Food in the Last Year: Never true  Transportation Needs: No Transportation Needs (04/16/2022)   PRAPARE - Hydrologist (Medical): No    Lack of Transportation (Non-Medical): No  Physical Activity: Inactive (03/24/2017)   Exercise Vital Sign    Days of Exercise per Week: 0 days    Minutes of Exercise  per Session: 0 min  Stress: No Stress Concern Present (03/24/2017)   [place]    Feeling of Stress : Not at all  Social Connections: Somewhat Isolated (03/24/2017)   Social Connection and Isolation Panel [NHANES]    Frequency of Communication with Friends and Family: More than three times a week    Frequency of Social Gatherings with Friends and Family: Once a week    Attends Religious Services: Never    Marine scientist or Organizations: No    Attends Archivist Meetings: Never    Marital Status: Married  Human resources officer Violence: Not At Risk (04/16/2022)   Humiliation, Afraid, Rape, and Kick questionnaire  Fear of Current or Ex-Partner: No    Emotionally Abused: No    Physically Abused: No    Sexually Abused: No    Family History  Problem Relation Age of Onset   Diabetes Mother    Hypertension Mother    Hypertension Father    Diabetes Father    Breast cancer Maternal Aunt 74   Breast cancer Maternal Aunt 50   Hypertension Other    Diabetes Other     Allergies  Allergen Reactions   Dust Mite Extract Shortness Of Breath and Itching   Egg-Derived Products Itching    MAKES TONGUE ITCH but she will eat, only in small amounts   Mold Extract [Trichophyton] Shortness Of Breath and Itching   Pollen Extract Shortness Of Breath    Itching, shortness of breath, asthma like symptoms   Ozempic (0.25 Or 0.5 Mg-Dose) [Semaglutide(0.25 Or 0.5mg -Dos)] Other (See Comments)    pancreatitis   Sulfa Antibiotics Itching   Misc. Sulfonamide Containing Compounds    Feraheme [Ferumoxytol] Itching    Uses benadryl so she can get the medicine    Review of Systems  Constitutional:  Positive for malaise/fatigue.  HENT: Negative.    Eyes: Negative.   Respiratory:  Positive for cough.   Cardiovascular: Negative.   Gastrointestinal: Negative.   Genitourinary:        HD patient  Musculoskeletal: Negative.    Skin: Negative.   Neurological: Negative.   Endo/Heme/Allergies: Negative.   Psychiatric/Behavioral:  Positive for depression. The patient has insomnia.        Objective:   BP 110/80   Pulse (!) 106   Ht 5' 4.5" (1.638 m)   Wt 266 lb 1.5 oz (120.7 kg)   LMP 10/14/2012 (Approximate)   SpO2 93% Comment: 4L  BMI 44.97 kg/m   Vitals:   05/22/22 1100  BP: 110/80  Pulse: (!) 106  Height: 5' 4.5" (1.638 m)  Weight: 266 lb 1.5 oz (120.7 kg)  SpO2: 93% Comment: 4L  BMI (Calculated): 44.99    Physical Exam Vitals reviewed.  Constitutional:      Appearance: Normal appearance.  HENT:     Head: Normocephalic.     Nose: Nose normal.     Mouth/Throat:     Mouth: Mucous membranes are moist.  Eyes:     Pupils: Pupils are equal, round, and reactive to light.  Cardiovascular:     Rate and Rhythm: Normal rate and regular rhythm.  Pulmonary:     Breath sounds: Normal breath sounds.  Abdominal:     General: Bowel sounds are normal.     Palpations: Abdomen is soft.  Musculoskeletal:        General: Normal range of motion.     Cervical back: Normal range of motion and neck supple.  Skin:    General: Skin is warm and dry.  Neurological:     Mental Status: She is alert and oriented to person, place, and time.  Psychiatric:        Mood and Affect: Mood normal.        Behavior: Behavior normal.      No results found for any visits on 05/22/22.  Recent Results (from the past 2160 hour(s))  Aerobic Culture w Gram Stain (superficial specimen)     Status: None   Collection Time: 04/01/22 11:25 AM   Specimen: Wound  Result Value Ref Range   Specimen Description      WOUND Performed at [facility], [address] [place], [place] [address]    Special Requests      RT FOOT Performed at [facility], [place], [place] [address]    Gram Stain      RARE WBC PRESENT, PREDOMINANTLY PMN FEW GRAM POSITIVE COCCI IN PAIRS RARE GRAM POSITIVE  COCCI IN CLUSTERS    Culture      MODERATE STAPHYLOCOCCUS LUGDUNENSIS MODERATE CORYNEBACTERIUM AMYCOLATUM Standardized susceptibility testing for this organism is not available. Performed at [facility], [place] [address], [place], [place] [address]    Report Status 04/06/2022 FINAL    Organism ID, Bacteria STAPHYLOCOCCUS LUGDUNENSIS       Susceptibility   Staphylococcus lugdunensis - MIC*    CIPROFLOXACIN <=0.5 SENSITIVE Sensitive     ERYTHROMYCIN >=8 RESISTANT Resistant     GENTAMICIN <=0.5 SENSITIVE Sensitive     OXACILLIN 0.5 SENSITIVE Sensitive     TETRACYCLINE 2 SENSITIVE Sensitive     VANCOMYCIN <=0.5 SENSITIVE Sensitive     TRIMETH/SULFA <=10 SENSITIVE Sensitive     CLINDAMYCIN >=8 RESISTANT Resistant     RIFAMPIN <=0.5 SENSITIVE Sensitive     Inducible Clindamycin NEGATIVE Sensitive     * MODERATE STAPHYLOCOCCUS LUGDUNENSIS  Comprehensive metabolic panel     Status: Abnormal   Collection Time: 04/15/22  4:54 PM  Result Value Ref Range   Sodium 132 (L) 135 - 145 mmol/L   Potassium 3.8 3.5 - 5.1 mmol/L   Chloride 95 (L) 98 - 111 mmol/L   CO2 19 (L) 22 - 32 mmol/L   Glucose, Bld 240 (H) 70 - 99 mg/dL    Comment: Glucose reference range applies only to samples taken after fasting for at least 8 hours.   BUN 65 (H) 6 - 20 mg/dL   Creatinine, Ser 8.83 (H) 0.44 - 1.00 mg/dL   Calcium 8.7 (L) 8.9 - 10.3 mg/dL   Total Protein 7.4 6.5 - 8.1 g/dL   Albumin 3.8 3.5 - 5.0 g/dL   AST 21 15 - 41 U/L   ALT 22 0 - 44 U/L   Alkaline Phosphatase 417 (H) 38 - 126 U/L   Total Bilirubin 0.9 0.3 - 1.2 mg/dL   GFR, Estimated 5 (L) >60 mL/min    Comment: (NOTE) Calculated using the CKD-EPI Creatinine Equation (2021)    Anion gap 18 (H) 5 - 15    Comment: Performed at [facility], [address] [place], [place] [address]  Lactic acid, plasma     Status: None   Collection Time: 04/15/22  4:54 PM  Result Value Ref Range   Lactic Acid, Venous 1.6 0.5 - 1.9 mmol/L     Comment: Performed at [facility], [address] [place], [place] [address]  CBC with Differential     Status: Abnormal   Collection Time: 04/15/22  4:54 PM  Result Value Ref Range   WBC 8.7 4.0 - 10.5 K/uL   RBC 4.07 3.87 - 5.11 MIL/uL   Hemoglobin 11.9 (L) 12.0 - 15.0 g/dL   HCT 39.5 36.0 - 46.0 %   MCV 97.1 80.0 - 100.0 fL   MCH 29.2 26.0 - 34.0 pg   MCHC 30.1 30.0 - 36.0 g/dL   RDW 13.8 11.5 - 15.5 %   Platelets 291 150 - 400 K/uL   nRBC 0.0 0.0 - 0.2 %   Neutrophils Relative % 78 %   Neutro Abs 6.9 1.7 - 7.7 K/uL   Lymphocytes Relative 12 %   Lymphs  Abs 1.1 0.7 - 4.0 K/uL   Monocytes Relative 7 %   Monocytes Absolute 0.6 0.1 - 1.0 K/uL   Eosinophils Relative 1 %   Eosinophils Absolute 0.1 0.0 - 0.5 K/uL   Basophils Relative 1 %   Basophils Absolute 0.1 0.0 - 0.1 K/uL   Immature Granulocytes 1 %   Abs Immature Granulocytes 0.04 0.00 - 0.07 K/uL    Comment: Performed at [facility], [address] [place], [place] [address]  Protime-INR     Status: None   Collection Time: 04/15/22  4:54 PM  Result Value Ref Range   Prothrombin Time 15.1 11.4 - 15.2 seconds   INR 1.2 0.8 - 1.2    Comment: (NOTE) INR goal varies based on device and disease states. Performed at [facility], [address] [place], [place] [address]   Culture, blood (Routine x 2)     Status: None   Collection Time: 04/15/22  4:54 PM   Specimen: BLOOD  Result Value Ref Range   Specimen Description BLOOD LEFT ANTECUBITAL    Special Requests      BOTTLES DRAWN AEROBIC AND ANAEROBIC Blood Culture adequate volume   Culture      NO GROWTH 5 DAYS Performed at [facility], [address], [place], [place] [address]    Report Status 04/20/2022 FINAL   Sedimentation rate     Status: None   Collection Time: 04/15/22  4:54 PM  Result Value Ref Range   Sed Rate 24 0 - 30 mm/hr    Comment: Performed at [facility], [address] [place], [place]  [address]  Culture, blood (Routine x 2)     Status: None   Collection Time: 04/15/22  7:24 PM   Specimen: BLOOD  Result Value Ref Range   Specimen Description BLOOD RIGHT ANTECUBITAL    Special Requests      BOTTLES DRAWN AEROBIC AND ANAEROBIC Blood Culture adequate volume   Culture      NO GROWTH 5 DAYS Performed at [facility], [address], [place], [place] [address]    Report Status 04/20/2022 FINAL   Lactic acid, plasma     Status: None   Collection Time: 04/15/22  7:25 PM  Result Value Ref Range   Lactic Acid, Venous 1.6 0.5 - 1.9 mmol/L    Comment: Performed at [facility], [address], [place],  [address]  Resp panel by RT-PCR (RSV, Flu A&B, Covid) Anterior Nasal Swab     Status: None   Collection Time: 04/15/22  7:25 PM   Specimen: Anterior Nasal Swab  Result Value Ref Range   SARS Coronavirus 2 by RT PCR NEGATIVE NEGATIVE    Comment: (NOTE) SARS-CoV-2 target nucleic acids are NOT DETECTED.  The SARS-CoV-2 RNA is generally detectable in upper respiratory specimens during the acute phase of infection. The lowest concentration of SARS-CoV-2 viral copies this assay can detect is 138 copies/mL. A negative result does not preclude SARS-Cov-2 infection and should not be used as the sole basis for treatment or other patient management decisions. A negative result may occur with  improper specimen collection/handling, submission of specimen other than nasopharyngeal swab, presence of viral mutation(s) within the areas targeted by this assay, and inadequate number of viral copies(<138 copies/mL). A negative result must be combined with clinical observations, patient history, and epidemiological information. The expected result is Negative.  Fact Sheet for Patients:  EntrepreneurPulse.com.au  Fact Sheet for Healthcare Providers:  IncredibleEmployment.be  This test is no t yet approved or cleared by the [place] and  has been authorized for detection and/or diagnosis of SARS-CoV-2 by FDA under an Emergency Use Authorization (EUA). This EUA will remain  in effect (meaning this test can be used) for the duration of the COVID-19 declaration under Section 564(b)(1) of the Act, 21 U.S.C.section 360bbb-3(b)(1), unless the authorization is terminated  or revoked sooner.       Influenza A by PCR NEGATIVE NEGATIVE   Influenza B by PCR NEGATIVE NEGATIVE    Comment: (NOTE) The Xpert Xpress SARS-CoV-2/FLU/RSV plus assay is intended as an aid in the diagnosis of influenza from Nasopharyngeal swab specimens and should not be used as a sole basis for treatment. Nasal washings and aspirates are unacceptable for Xpert Xpress SARS-CoV-2/FLU/RSV testing.  Fact Sheet for Patients: EntrepreneurPulse.com.au  Fact Sheet for Healthcare Providers: IncredibleEmployment.be  This test is not yet approved or cleared by the [place] FDA and has been authorized for detection and/or diagnosis of SARS-CoV-2 by FDA under an Emergency Use Authorization (EUA). This EUA will remain in effect (meaning this test can be used) for the duration of the COVID-19 declaration under Section 564(b)(1) of the Act, 21 U.S.C. section 360bbb-3(b)(1), unless the authorization is terminated or revoked.     Resp Syncytial Virus by PCR NEGATIVE NEGATIVE    Comment: (NOTE) Fact Sheet for Patients: EntrepreneurPulse.com.au  Fact Sheet for Healthcare Providers: IncredibleEmployment.be  This test is not yet approved or cleared by the [place] FDA and has been authorized for detection and/or diagnosis of SARS-CoV-2 by FDA under an Emergency Use Authorization (EUA). This EUA will remain in effect (meaning this test can be used) for the duration of the COVID-19 declaration under Section 564(b)(1) of the Act, 21 U.S.C. section 360bbb-3(b)(1), unless  the authorization is terminated or revoked.  Performed at [facility], [address] [place], [place] [address]   CBG monitoring, ED     Status: Abnormal   Collection Time: 04/16/22 12:43 AM  Result Value Ref Range   Glucose-Capillary 256 (H) 70 - 99 mg/dL    Comment: Glucose reference range applies only to samples taken after fasting for at least 8 hours.  C-reactive protein     Status: None   Collection Time: 04/16/22  2:15 AM  Result Value Ref Range   CRP 0.8 <1.0 mg/dL    Comment: Performed at [facility], [place] [address], [place], [place] [address]  Beta-hydroxybutyric acid     Status: Abnormal   Collection Time: 04/16/22  2:15 AM  Result Value Ref Range   Beta-Hydroxybutyric Acid 0.34 (H) 0.05 - 0.27 mmol/L    Comment: Performed at [facility], [place], [place] [address]  HIV Antibody (routine testing w rflx)     Status: None   Collection Time: 04/16/22  2:15 AM  Result Value Ref Range   HIV Screen 4th Generation wRfx Non Reactive Non Reactive    Comment: Performed at [facility], [place] [address] [place], [place] [address]  Prealbumin     Status: None   Collection Time: 04/16/22  2:15 AM  Result Value Ref Range   Prealbumin 18 18 - 38 mg/dL    Comment: Performed at [facility] [address], [place],  [address]  Hemoglobin A1c     Status: Abnormal   Collection Time: 04/16/22  2:15 AM  Result Value Ref Range  Hgb A1c MFr Bld 7.3 (H) 4.8 - 5.6 %    Comment: (NOTE) Pre diabetes:          5.7%-6.4%  Diabetes:              >6.4%  Glycemic control for   <7.0% adults with diabetes    Mean Plasma Glucose 162.81 mg/dL    Comment: Performed at [facility], [place] [address], [place], [place] [address]  Glucose, capillary     Status: Abnormal   Collection Time: 04/16/22  3:49 AM  Result Value Ref Range   Glucose-Capillary 202 (H) 70 - 99 mg/dL    Comment: Glucose reference range applies only to  samples taken after fasting for at least 8 hours.  Glucose, capillary     Status: Abnormal   Collection Time: 04/16/22  5:20 AM  Result Value Ref Range   Glucose-Capillary 205 (H) 70 - 99 mg/dL    Comment: Glucose reference range applies only to samples taken after fasting for at least 8 hours.   Comment 1 Notify RN   Glucose, capillary     Status: Abnormal   Collection Time: 04/16/22 12:26 PM  Result Value Ref Range   Glucose-Capillary 156 (H) 70 - 99 mg/dL    Comment: Glucose reference range applies only to samples taken after fasting for at least 8 hours.  Hepatitis B surface antigen     Status: None   Collection Time: 04/16/22  2:58 PM  Result Value Ref Range   Hepatitis B Surface Ag NON REACTIVE NON REACTIVE    Comment: Performed at [facility] [address] [place], [place] [address]  Hepatitis B surface antibody,quantitative     Status: Abnormal   Collection Time: 04/16/22  2:58 PM  Result Value Ref Range   Hep B S AB Quant (Post) <3.1 (L) Immunity>9.9 mIU/mL    Comment: (NOTE)  Status of Immunity                     Anti-HBs Level  ------------------                     -------------- Inconsistent with Immunity                   0.0 - 9.9 Consistent with Immunity                          >9.9 Performed At: [facility] [place], [place] [id number] Rush Farmer MD [id number]   CBC     Status: Abnormal   Collection Time: 04/16/22  3:01 PM  Result Value Ref Range   WBC 8.5 4.0 - 10.5 K/uL   RBC 3.63 (L) 3.87 - 5.11 MIL/uL   Hemoglobin 10.8 (L) 12.0 - 15.0 g/dL   HCT 34.8 (L) 36.0 - 46.0 %   MCV 95.9 80.0 - 100.0 fL   MCH 29.8 26.0 - 34.0 pg   MCHC 31.0 30.0 - 36.0 g/dL   RDW 13.8 11.5 - 15.5 %   Platelets 268 150 - 400 K/uL   nRBC 0.0 0.0 - 0.2 %    Comment: Performed at [facility], [address] [place], [place] [address]  Renal function panel     Status: Abnormal   Collection Time: 04/16/22  3:01 PM  Result Value  Ref Range   Sodium 130 (L) 135 - 145 mmol/L  Potassium 3.9 3.5 - 5.1 mmol/L   Chloride 91 (L) 98 - 111 mmol/L   CO2 19 (L) 22 - 32 mmol/L   Glucose, Bld 236 (H) 70 - 99 mg/dL    Comment: Glucose reference range applies only to samples taken after fasting for at least 8 hours.   BUN 76 (H) 6 - 20 mg/dL   Creatinine, Ser 9.90 (H) 0.44 - 1.00 mg/dL   Calcium 8.7 (L) 8.9 - 10.3 mg/dL   Phosphorus 9.2 (H) 2.5 - 4.6 mg/dL   Albumin 3.6 3.5 - 5.0 g/dL   GFR, Estimated 4 (L) >60 mL/min    Comment: (NOTE) Calculated using the CKD-EPI Creatinine Equation (2021)    Anion gap 20 (H) 5 - 15    Comment: Performed at [facility], [address] [place], [place] [address]  Glucose, capillary     Status: Abnormal   Collection Time: 04/16/22  8:46 PM  Result Value Ref Range   Glucose-Capillary 158 (H) 70 - 99 mg/dL    Comment: Glucose reference range applies only to samples taken after fasting for at least 8 hours.  Glucose, capillary     Status: Abnormal   Collection Time: 04/16/22 11:50 PM  Result Value Ref Range   Glucose-Capillary 208 (H) 70 - 99 mg/dL    Comment: Glucose reference range applies only to samples taken after fasting for at least 8 hours.  Glucose, capillary     Status: Abnormal   Collection Time: 04/17/22  4:48 AM  Result Value Ref Range   Glucose-Capillary 191 (H) 70 - 99 mg/dL    Comment: Glucose reference range applies only to samples taken after fasting for at least 8 hours.  Glucose, capillary     Status: Abnormal   Collection Time: 04/17/22  7:48 AM  Result Value Ref Range   Glucose-Capillary 156 (H) 70 - 99 mg/dL    Comment: Glucose reference range applies only to samples taken after fasting for at least 8 hours.  Comprehensive metabolic panel     Status: Abnormal   Collection Time: 04/17/22  9:18 AM  Result Value Ref Range   Sodium 131 (L) 135 - 145 mmol/L   Potassium 3.7 3.5 - 5.1 mmol/L   Chloride 93 (L) 98 - 111 mmol/L   CO2 23 22 - 32 mmol/L    Glucose, Bld 152 (H) 70 - 99 mg/dL    Comment: Glucose reference range applies only to samples taken after fasting for at least 8 hours.   BUN 48 (H) 6 - 20 mg/dL   Creatinine, Ser 7.26 (H) 0.44 - 1.00 mg/dL   Calcium 9.0 8.9 - 10.3 mg/dL   Total Protein 7.1 6.5 - 8.1 g/dL   Albumin 3.7 3.5 - 5.0 g/dL   AST 18 15 - 41 U/L   ALT 15 0 - 44 U/L   Alkaline Phosphatase 353 (H) 38 - 126 U/L   Total Bilirubin 0.7 0.3 - 1.2 mg/dL   GFR, Estimated 6 (L) >60 mL/min    Comment: (NOTE) Calculated using the CKD-EPI Creatinine Equation (2021)    Anion gap 15 5 - 15    Comment: Performed at [facility], [address] [place], [place] [address]  CBC with Differential/Platelet     Status: Abnormal   Collection Time: 04/17/22  9:18 AM  Result Value Ref Range   WBC 7.6 4.0 - 10.5 K/uL   RBC 3.85 (L) 3.87 - 5.11 MIL/uL   Hemoglobin 11.4 (L) 12.0 -  15.0 g/dL   HCT 36.3 36.0 - 46.0 %   MCV 94.3 80.0 - 100.0 fL   MCH 29.6 26.0 - 34.0 pg   MCHC 31.4 30.0 - 36.0 g/dL   RDW 13.9 11.5 - 15.5 %   Platelets 267 150 - 400 K/uL   nRBC 0.0 0.0 - 0.2 %   Neutrophils Relative % 79 %   Neutro Abs 5.9 1.7 - 7.7 K/uL   Lymphocytes Relative 12 %   Lymphs Abs 0.9 0.7 - 4.0 K/uL   Monocytes Relative 7 %   Monocytes Absolute 0.6 0.1 - 1.0 K/uL   Eosinophils Relative 2 %   Eosinophils Absolute 0.1 0.0 - 0.5 K/uL   Basophils Relative 0 %   Basophils Absolute 0.0 0.0 - 0.1 K/uL   Immature Granulocytes 0 %   Abs Immature Granulocytes 0.03 0.00 - 0.07 K/uL    Comment: Performed at [facility], [address] [place], [place] [address]  Glucose, capillary     Status: Abnormal   Collection Time: 04/17/22 11:47 AM  Result Value Ref Range   Glucose-Capillary 123 (H) 70 - 99 mg/dL    Comment: Glucose reference range applies only to samples taken after fasting for at least 8 hours.  Glucose, capillary     Status: Abnormal   Collection Time: 04/17/22  3:26 PM  Result Value Ref Range    Glucose-Capillary 139 (H) 70 - 99 mg/dL    Comment: Glucose reference range applies only to samples taken after fasting for at least 8 hours.  Glucose, capillary     Status: Abnormal   Collection Time: 04/17/22  8:01 PM  Result Value Ref Range   Glucose-Capillary 225 (H) 70 - 99 mg/dL    Comment: Glucose reference range applies only to samples taken after fasting for at least 8 hours.  Glucose, capillary     Status: Abnormal   Collection Time: 04/17/22 11:59 PM  Result Value Ref Range   Glucose-Capillary 254 (H) 70 - 99 mg/dL    Comment: Glucose reference range applies only to samples taken after fasting for at least 8 hours.  Glucose, capillary     Status: Abnormal   Collection Time: 04/18/22  3:42 AM  Result Value Ref Range   Glucose-Capillary 141 (H) 70 - 99 mg/dL    Comment: Glucose reference range applies only to samples taken after fasting for at least 8 hours.  Glucose, capillary     Status: Abnormal   Collection Time: 04/18/22  8:23 AM  Result Value Ref Range   Glucose-Capillary 114 (H) 70 - 99 mg/dL    Comment: Glucose reference range applies only to samples taken after fasting for at least 8 hours.  CBC     Status: Abnormal   Collection Time: 04/18/22  9:14 AM  Result Value Ref Range   WBC 12.1 (H) 4.0 - 10.5 K/uL   RBC 3.86 (L) 3.87 - 5.11 MIL/uL   Hemoglobin 11.3 (L) 12.0 - 15.0 g/dL   HCT 36.4 36.0 - 46.0 %   MCV 94.3 80.0 - 100.0 fL   MCH 29.3 26.0 - 34.0 pg   MCHC 31.0 30.0 - 36.0 g/dL   RDW 13.8 11.5 - 15.5 %   Platelets 260 150 - 400 K/uL   nRBC 0.0 0.0 - 0.2 %    Comment: Performed at [facility], [address] [place], [place] [address]  Renal function panel     Status: Abnormal   Collection Time: 04/18/22  9:14 AM  Result Value Ref Range   Sodium 130 (L) 135 - 145 mmol/L   Potassium 4.1 3.5 - 5.1 mmol/L   Chloride 94 (L) 98 - 111 mmol/L   CO2 19 (L) 22 - 32 mmol/L   Glucose, Bld 132 (H) 70 - 99 mg/dL    Comment: Glucose reference range  applies only to samples taken after fasting for at least 8 hours.   BUN 62 (H) 6 - 20 mg/dL   Creatinine, Ser 8.82 (H) 0.44 - 1.00 mg/dL   Calcium 8.6 (L) 8.9 - 10.3 mg/dL   Phosphorus 8.5 (H) 2.5 - 4.6 mg/dL   Albumin 3.6 3.5 - 5.0 g/dL   GFR, Estimated 5 (L) >60 mL/min    Comment: (NOTE) Calculated using the CKD-EPI Creatinine Equation (2021)    Anion gap 17 (H) 5 - 15    Comment: Performed at [facility], [address] [place], [place] [address]  Glucose, capillary     Status: Abnormal   Collection Time: 04/18/22  4:01 PM  Result Value Ref Range   Glucose-Capillary 186 (H) 70 - 99 mg/dL    Comment: Glucose reference range applies only to samples taken after fasting for at least 8 hours.  Aerobic Culture w Gram Stain (superficial specimen)     Status: None   Collection Time: 04/18/22  5:45 PM   Specimen: Cervix, Endocervical  Result Value Ref Range   Specimen Description      ENDOCERVICAL Performed at [facility], [address], [place], [place] [address]    Special Requests      NONE ENDOCERVICAL Performed at [facility], [place], [place] [address]    Gram Stain NO WBC SEEN NO ORGANISMS SEEN     Culture      NO GROWTH 2 DAYS Performed at [facility], [place] [address] [place], [place] [address]    Report Status 04/21/2022 FINAL   Glucose, capillary     Status: Abnormal   Collection Time: 04/18/22  8:00 PM  Result Value Ref Range   Glucose-Capillary 184 (H) 70 - 99 mg/dL    Comment: Glucose reference range applies only to samples taken after fasting for at least 8 hours.  Glucose, capillary     Status: Abnormal   Collection Time: 04/18/22 11:17 PM  Result Value Ref Range   Glucose-Capillary 271 (H) 70 - 99 mg/dL    Comment: Glucose reference range applies only to samples taken after fasting for at least 8 hours.  Glucose, capillary     Status: Abnormal   Collection Time: 04/19/22  3:59 AM  Result Value Ref  Range   Glucose-Capillary 164 (H) 70 - 99 mg/dL    Comment: Glucose reference range applies only to samples taken after fasting for at least 8 hours.  Glucose, capillary     Status: Abnormal   Collection Time: 04/19/22  8:29 AM  Result Value Ref Range   Glucose-Capillary 146 (H) 70 - 99 mg/dL    Comment: Glucose reference range applies only to samples taken after fasting for at least 8 hours.  Glucose, capillary     Status: Abnormal   Collection Time: 04/19/22 11:35 AM  Result Value Ref Range   Glucose-Capillary 202 (H) 70 - 99 mg/dL    Comment: Glucose reference range applies only to samples taken after fasting for at least 8 hours.  Glucose, capillary     Status: Abnormal   Collection Time: 04/19/22  3:10 PM  Result Value Ref Range   Glucose-Capillary 235 (H) 70 - 99 mg/dL    Comment: Glucose reference range applies only to samples taken after fasting for at least 8 hours.  Glucose, capillary     Status: Abnormal   Collection Time: 04/19/22  5:21 PM  Result Value Ref Range   Glucose-Capillary 211 (H) 70 - 99 mg/dL    Comment: Glucose reference range applies only to samples taken after fasting for at least 8 hours.  Glucose, capillary     Status: Abnormal   Collection Time: 04/19/22  7:45 PM  Result Value Ref Range   Glucose-Capillary 214 (H) 70 - 99 mg/dL    Comment: Glucose reference range applies only to samples taken after fasting for at least 8 hours.   Comment 1 Notify RN   Glucose, capillary     Status: Abnormal   Collection Time: 04/19/22 11:21 PM  Result Value Ref Range   Glucose-Capillary 217 (H) 70 - 99 mg/dL    Comment: Glucose reference range applies only to samples taken after fasting for at least 8 hours.  Glucose, capillary     Status: Abnormal   Collection Time: 04/20/22  4:06 AM  Result Value Ref Range   Glucose-Capillary 218 (H) 70 - 99 mg/dL    Comment: Glucose reference range applies only to samples taken after fasting for at least 8 hours.   Comment 1  Notify RN   Vancomycin, random     Status: None   Collection Time: 04/20/22  5:10 AM  Result Value Ref Range   Vancomycin Rm 25 ug/mL    Comment:        Random Vancomycin therapeutic range is dependent on dosage and time of specimen collection. A peak range is 20-40 ug/mL A trough range is 5-15 ug/mL        Performed at [facility], [address] [place], [place] [address]   Glucose, capillary     Status: Abnormal   Collection Time: 04/20/22  8:09 AM  Result Value Ref Range   Glucose-Capillary 170 (H) 70 - 99 mg/dL    Comment: Glucose reference range applies only to samples taken after fasting for at least 8 hours.  CBC     Status: Abnormal   Collection Time: 04/20/22  8:39 AM  Result Value Ref Range   WBC 8.8 4.0 - 10.5 K/uL   RBC 3.80 (L) 3.87 - 5.11 MIL/uL   Hemoglobin 11.1 (L) 12.0 - 15.0 g/dL   HCT 36.8 36.0 - 46.0 %   MCV 96.8 80.0 - 100.0 fL   MCH 29.2 26.0 - 34.0 pg   MCHC 30.2 30.0 - 36.0 g/dL   RDW 13.8 11.5 - 15.5 %   Platelets 227 150 - 400 K/uL   nRBC 0.0 0.0 - 0.2 %    Comment: Performed at [facility], [address] [place], [place] [address]  Renal function panel     Status: Abnormal   Collection Time: 04/20/22  8:39 AM  Result Value Ref Range   Sodium 130 (L) 135 - 145 mmol/L   Potassium 4.6 3.5 - 5.1 mmol/L   Chloride 92 (L) 98 - 111 mmol/L   CO2 21 (L) 22 - 32 mmol/L   Glucose, Bld 195 (H) 70 - 99 mg/dL    Comment: Glucose reference range applies only to samples taken after fasting for at least 8 hours.   BUN 61 (H) 6 - 20 mg/dL  Creatinine, Ser 8.07 (H) 0.44 - 1.00 mg/dL   Calcium 8.9 8.9 - 10.3 mg/dL   Phosphorus 8.2 (H) 2.5 - 4.6 mg/dL   Albumin 3.6 3.5 - 5.0 g/dL   GFR, Estimated 6 (L) >60 mL/min    Comment: (NOTE) Calculated using the CKD-EPI Creatinine Equation (2021)    Anion gap 17 (H) 5 - 15    Comment: Performed at [facility], [address] [place], [place] [address]  Glucose, capillary      Status: Abnormal   Collection Time: 04/20/22  2:18 PM  Result Value Ref Range   Glucose-Capillary 208 (H) 70 - 99 mg/dL    Comment: Glucose reference range applies only to samples taken after fasting for at least 8 hours.      Assessment & Plan:   Problem List Items Addressed This Visit   None Visit Diagnoses     Allergic rhinitis due to pollen, unspecified seasonality    -  Primary   Relevant Medications   benzonatate (TESSALON PERLES) 100 MG capsule       No follow-ups on file.   Total time spent: 35 minutes  Evern Bio, NP  05/22/2022

## 2022-05-23 DIAGNOSIS — N186 End stage renal disease: Secondary | ICD-10-CM | POA: Diagnosis not present

## 2022-05-23 DIAGNOSIS — Z992 Dependence on renal dialysis: Secondary | ICD-10-CM | POA: Diagnosis not present

## 2022-05-23 DIAGNOSIS — M86171 Other acute osteomyelitis, right ankle and foot: Secondary | ICD-10-CM | POA: Diagnosis not present

## 2022-05-23 DIAGNOSIS — E8770 Fluid overload, unspecified: Secondary | ICD-10-CM | POA: Diagnosis not present

## 2022-05-25 DIAGNOSIS — N186 End stage renal disease: Secondary | ICD-10-CM | POA: Diagnosis not present

## 2022-05-25 DIAGNOSIS — E8770 Fluid overload, unspecified: Secondary | ICD-10-CM | POA: Diagnosis not present

## 2022-05-25 DIAGNOSIS — Z992 Dependence on renal dialysis: Secondary | ICD-10-CM | POA: Diagnosis not present

## 2022-05-25 DIAGNOSIS — M86171 Other acute osteomyelitis, right ankle and foot: Secondary | ICD-10-CM | POA: Diagnosis not present

## 2022-05-26 DIAGNOSIS — Z992 Dependence on renal dialysis: Secondary | ICD-10-CM | POA: Diagnosis not present

## 2022-05-26 DIAGNOSIS — N186 End stage renal disease: Secondary | ICD-10-CM | POA: Diagnosis not present

## 2022-05-27 ENCOUNTER — Encounter: Payer: Self-pay | Admitting: Oncology

## 2022-05-27 DIAGNOSIS — J45909 Unspecified asthma, uncomplicated: Secondary | ICD-10-CM | POA: Diagnosis not present

## 2022-05-27 DIAGNOSIS — I5040 Unspecified combined systolic (congestive) and diastolic (congestive) heart failure: Secondary | ICD-10-CM | POA: Diagnosis not present

## 2022-05-28 DIAGNOSIS — Z992 Dependence on renal dialysis: Secondary | ICD-10-CM | POA: Diagnosis not present

## 2022-05-28 DIAGNOSIS — N2581 Secondary hyperparathyroidism of renal origin: Secondary | ICD-10-CM | POA: Diagnosis not present

## 2022-05-28 DIAGNOSIS — M869 Osteomyelitis, unspecified: Secondary | ICD-10-CM | POA: Diagnosis not present

## 2022-05-28 DIAGNOSIS — M86171 Other acute osteomyelitis, right ankle and foot: Secondary | ICD-10-CM | POA: Diagnosis not present

## 2022-05-28 DIAGNOSIS — N186 End stage renal disease: Secondary | ICD-10-CM | POA: Diagnosis not present

## 2022-05-29 ENCOUNTER — Other Ambulatory Visit: Payer: Self-pay | Admitting: Nurse Practitioner

## 2022-05-29 DIAGNOSIS — I70203 Unspecified atherosclerosis of native arteries of extremities, bilateral legs: Secondary | ICD-10-CM | POA: Diagnosis not present

## 2022-05-29 DIAGNOSIS — D631 Anemia in chronic kidney disease: Secondary | ICD-10-CM | POA: Diagnosis not present

## 2022-05-29 DIAGNOSIS — Z992 Dependence on renal dialysis: Secondary | ICD-10-CM | POA: Diagnosis not present

## 2022-05-29 DIAGNOSIS — E872 Acidosis, unspecified: Secondary | ICD-10-CM | POA: Diagnosis not present

## 2022-05-29 DIAGNOSIS — Z7985 Long-term (current) use of injectable non-insulin antidiabetic drugs: Secondary | ICD-10-CM | POA: Diagnosis not present

## 2022-05-29 DIAGNOSIS — E1122 Type 2 diabetes mellitus with diabetic chronic kidney disease: Secondary | ICD-10-CM | POA: Diagnosis not present

## 2022-05-29 DIAGNOSIS — N186 End stage renal disease: Secondary | ICD-10-CM | POA: Diagnosis not present

## 2022-05-29 DIAGNOSIS — E11621 Type 2 diabetes mellitus with foot ulcer: Secondary | ICD-10-CM | POA: Diagnosis not present

## 2022-05-29 DIAGNOSIS — L97513 Non-pressure chronic ulcer of other part of right foot with necrosis of muscle: Secondary | ICD-10-CM | POA: Diagnosis not present

## 2022-05-29 DIAGNOSIS — E1169 Type 2 diabetes mellitus with other specified complication: Secondary | ICD-10-CM | POA: Diagnosis not present

## 2022-05-29 DIAGNOSIS — I951 Orthostatic hypotension: Secondary | ICD-10-CM | POA: Diagnosis not present

## 2022-05-29 DIAGNOSIS — M86371 Chronic multifocal osteomyelitis, right ankle and foot: Secondary | ICD-10-CM | POA: Diagnosis not present

## 2022-05-29 DIAGNOSIS — E1165 Type 2 diabetes mellitus with hyperglycemia: Secondary | ICD-10-CM | POA: Diagnosis not present

## 2022-05-29 DIAGNOSIS — J9612 Chronic respiratory failure with hypercapnia: Secondary | ICD-10-CM | POA: Diagnosis not present

## 2022-05-29 DIAGNOSIS — Z9981 Dependence on supplemental oxygen: Secondary | ICD-10-CM | POA: Diagnosis not present

## 2022-05-29 DIAGNOSIS — Z794 Long term (current) use of insulin: Secondary | ICD-10-CM | POA: Diagnosis not present

## 2022-05-29 DIAGNOSIS — I132 Hypertensive heart and chronic kidney disease with heart failure and with stage 5 chronic kidney disease, or end stage renal disease: Secondary | ICD-10-CM | POA: Diagnosis not present

## 2022-05-29 DIAGNOSIS — I5032 Chronic diastolic (congestive) heart failure: Secondary | ICD-10-CM | POA: Diagnosis not present

## 2022-05-29 DIAGNOSIS — J441 Chronic obstructive pulmonary disease with (acute) exacerbation: Secondary | ICD-10-CM | POA: Diagnosis not present

## 2022-05-29 DIAGNOSIS — E1151 Type 2 diabetes mellitus with diabetic peripheral angiopathy without gangrene: Secondary | ICD-10-CM | POA: Diagnosis not present

## 2022-05-29 DIAGNOSIS — J9611 Chronic respiratory failure with hypoxia: Secondary | ICD-10-CM | POA: Diagnosis not present

## 2022-05-30 ENCOUNTER — Other Ambulatory Visit: Payer: Self-pay | Admitting: Nurse Practitioner

## 2022-05-30 DIAGNOSIS — Z992 Dependence on renal dialysis: Secondary | ICD-10-CM | POA: Diagnosis not present

## 2022-05-30 DIAGNOSIS — N2581 Secondary hyperparathyroidism of renal origin: Secondary | ICD-10-CM | POA: Diagnosis not present

## 2022-05-30 DIAGNOSIS — N186 End stage renal disease: Secondary | ICD-10-CM | POA: Diagnosis not present

## 2022-05-30 DIAGNOSIS — M86171 Other acute osteomyelitis, right ankle and foot: Secondary | ICD-10-CM | POA: Diagnosis not present

## 2022-05-31 ENCOUNTER — Ambulatory Visit: Payer: 59 | Admitting: Physician Assistant

## 2022-06-01 DIAGNOSIS — N186 End stage renal disease: Secondary | ICD-10-CM | POA: Diagnosis not present

## 2022-06-01 DIAGNOSIS — M86171 Other acute osteomyelitis, right ankle and foot: Secondary | ICD-10-CM | POA: Diagnosis not present

## 2022-06-01 DIAGNOSIS — N2581 Secondary hyperparathyroidism of renal origin: Secondary | ICD-10-CM | POA: Diagnosis not present

## 2022-06-01 DIAGNOSIS — Z992 Dependence on renal dialysis: Secondary | ICD-10-CM | POA: Diagnosis not present

## 2022-06-03 DIAGNOSIS — J9611 Chronic respiratory failure with hypoxia: Secondary | ICD-10-CM | POA: Diagnosis not present

## 2022-06-03 DIAGNOSIS — I132 Hypertensive heart and chronic kidney disease with heart failure and with stage 5 chronic kidney disease, or end stage renal disease: Secondary | ICD-10-CM | POA: Diagnosis not present

## 2022-06-03 DIAGNOSIS — I5032 Chronic diastolic (congestive) heart failure: Secondary | ICD-10-CM | POA: Diagnosis not present

## 2022-06-03 DIAGNOSIS — Z992 Dependence on renal dialysis: Secondary | ICD-10-CM | POA: Diagnosis not present

## 2022-06-03 DIAGNOSIS — N186 End stage renal disease: Secondary | ICD-10-CM | POA: Diagnosis not present

## 2022-06-03 DIAGNOSIS — E1122 Type 2 diabetes mellitus with diabetic chronic kidney disease: Secondary | ICD-10-CM | POA: Diagnosis not present

## 2022-06-03 DIAGNOSIS — E1169 Type 2 diabetes mellitus with other specified complication: Secondary | ICD-10-CM | POA: Diagnosis not present

## 2022-06-03 DIAGNOSIS — E11621 Type 2 diabetes mellitus with foot ulcer: Secondary | ICD-10-CM | POA: Diagnosis not present

## 2022-06-03 DIAGNOSIS — J441 Chronic obstructive pulmonary disease with (acute) exacerbation: Secondary | ICD-10-CM | POA: Diagnosis not present

## 2022-06-03 DIAGNOSIS — Z7985 Long-term (current) use of injectable non-insulin antidiabetic drugs: Secondary | ICD-10-CM | POA: Diagnosis not present

## 2022-06-03 DIAGNOSIS — J9612 Chronic respiratory failure with hypercapnia: Secondary | ICD-10-CM | POA: Diagnosis not present

## 2022-06-03 DIAGNOSIS — E1165 Type 2 diabetes mellitus with hyperglycemia: Secondary | ICD-10-CM | POA: Diagnosis not present

## 2022-06-03 DIAGNOSIS — D631 Anemia in chronic kidney disease: Secondary | ICD-10-CM | POA: Diagnosis not present

## 2022-06-03 DIAGNOSIS — E1151 Type 2 diabetes mellitus with diabetic peripheral angiopathy without gangrene: Secondary | ICD-10-CM | POA: Diagnosis not present

## 2022-06-03 DIAGNOSIS — Z794 Long term (current) use of insulin: Secondary | ICD-10-CM | POA: Diagnosis not present

## 2022-06-03 DIAGNOSIS — M86371 Chronic multifocal osteomyelitis, right ankle and foot: Secondary | ICD-10-CM | POA: Diagnosis not present

## 2022-06-03 DIAGNOSIS — Z9981 Dependence on supplemental oxygen: Secondary | ICD-10-CM | POA: Diagnosis not present

## 2022-06-03 DIAGNOSIS — I951 Orthostatic hypotension: Secondary | ICD-10-CM | POA: Diagnosis not present

## 2022-06-03 DIAGNOSIS — E872 Acidosis, unspecified: Secondary | ICD-10-CM | POA: Diagnosis not present

## 2022-06-03 DIAGNOSIS — L97513 Non-pressure chronic ulcer of other part of right foot with necrosis of muscle: Secondary | ICD-10-CM | POA: Diagnosis not present

## 2022-06-03 DIAGNOSIS — I70203 Unspecified atherosclerosis of native arteries of extremities, bilateral legs: Secondary | ICD-10-CM | POA: Diagnosis not present

## 2022-06-04 DIAGNOSIS — N186 End stage renal disease: Secondary | ICD-10-CM | POA: Diagnosis not present

## 2022-06-04 DIAGNOSIS — N2581 Secondary hyperparathyroidism of renal origin: Secondary | ICD-10-CM | POA: Diagnosis not present

## 2022-06-04 DIAGNOSIS — M86171 Other acute osteomyelitis, right ankle and foot: Secondary | ICD-10-CM | POA: Diagnosis not present

## 2022-06-04 DIAGNOSIS — M869 Osteomyelitis, unspecified: Secondary | ICD-10-CM | POA: Diagnosis not present

## 2022-06-04 DIAGNOSIS — Z992 Dependence on renal dialysis: Secondary | ICD-10-CM | POA: Diagnosis not present

## 2022-06-05 DIAGNOSIS — I951 Orthostatic hypotension: Secondary | ICD-10-CM | POA: Diagnosis not present

## 2022-06-05 DIAGNOSIS — Z992 Dependence on renal dialysis: Secondary | ICD-10-CM | POA: Diagnosis not present

## 2022-06-05 DIAGNOSIS — J9612 Chronic respiratory failure with hypercapnia: Secondary | ICD-10-CM | POA: Diagnosis not present

## 2022-06-05 DIAGNOSIS — I132 Hypertensive heart and chronic kidney disease with heart failure and with stage 5 chronic kidney disease, or end stage renal disease: Secondary | ICD-10-CM | POA: Diagnosis not present

## 2022-06-05 DIAGNOSIS — E1151 Type 2 diabetes mellitus with diabetic peripheral angiopathy without gangrene: Secondary | ICD-10-CM | POA: Diagnosis not present

## 2022-06-05 DIAGNOSIS — E1165 Type 2 diabetes mellitus with hyperglycemia: Secondary | ICD-10-CM | POA: Diagnosis not present

## 2022-06-05 DIAGNOSIS — E872 Acidosis, unspecified: Secondary | ICD-10-CM | POA: Diagnosis not present

## 2022-06-05 DIAGNOSIS — J441 Chronic obstructive pulmonary disease with (acute) exacerbation: Secondary | ICD-10-CM | POA: Diagnosis not present

## 2022-06-05 DIAGNOSIS — I70203 Unspecified atherosclerosis of native arteries of extremities, bilateral legs: Secondary | ICD-10-CM | POA: Diagnosis not present

## 2022-06-05 DIAGNOSIS — Z9981 Dependence on supplemental oxygen: Secondary | ICD-10-CM | POA: Diagnosis not present

## 2022-06-05 DIAGNOSIS — M86371 Chronic multifocal osteomyelitis, right ankle and foot: Secondary | ICD-10-CM | POA: Diagnosis not present

## 2022-06-05 DIAGNOSIS — E11621 Type 2 diabetes mellitus with foot ulcer: Secondary | ICD-10-CM | POA: Diagnosis not present

## 2022-06-05 DIAGNOSIS — E1122 Type 2 diabetes mellitus with diabetic chronic kidney disease: Secondary | ICD-10-CM | POA: Diagnosis not present

## 2022-06-05 DIAGNOSIS — L97513 Non-pressure chronic ulcer of other part of right foot with necrosis of muscle: Secondary | ICD-10-CM | POA: Diagnosis not present

## 2022-06-05 DIAGNOSIS — E1169 Type 2 diabetes mellitus with other specified complication: Secondary | ICD-10-CM | POA: Diagnosis not present

## 2022-06-05 DIAGNOSIS — Z7985 Long-term (current) use of injectable non-insulin antidiabetic drugs: Secondary | ICD-10-CM | POA: Diagnosis not present

## 2022-06-05 DIAGNOSIS — Z794 Long term (current) use of insulin: Secondary | ICD-10-CM | POA: Diagnosis not present

## 2022-06-05 DIAGNOSIS — I5032 Chronic diastolic (congestive) heart failure: Secondary | ICD-10-CM | POA: Diagnosis not present

## 2022-06-05 DIAGNOSIS — N186 End stage renal disease: Secondary | ICD-10-CM | POA: Diagnosis not present

## 2022-06-05 DIAGNOSIS — J9611 Chronic respiratory failure with hypoxia: Secondary | ICD-10-CM | POA: Diagnosis not present

## 2022-06-05 DIAGNOSIS — D631 Anemia in chronic kidney disease: Secondary | ICD-10-CM | POA: Diagnosis not present

## 2022-06-06 DIAGNOSIS — M86171 Other acute osteomyelitis, right ankle and foot: Secondary | ICD-10-CM | POA: Diagnosis not present

## 2022-06-06 DIAGNOSIS — Z992 Dependence on renal dialysis: Secondary | ICD-10-CM | POA: Diagnosis not present

## 2022-06-06 DIAGNOSIS — N186 End stage renal disease: Secondary | ICD-10-CM | POA: Diagnosis not present

## 2022-06-06 DIAGNOSIS — N2581 Secondary hyperparathyroidism of renal origin: Secondary | ICD-10-CM | POA: Diagnosis not present

## 2022-06-07 ENCOUNTER — Encounter: Payer: 59 | Attending: Physician Assistant | Admitting: Physician Assistant

## 2022-06-07 DIAGNOSIS — Z9981 Dependence on supplemental oxygen: Secondary | ICD-10-CM | POA: Diagnosis not present

## 2022-06-07 DIAGNOSIS — M86371 Chronic multifocal osteomyelitis, right ankle and foot: Secondary | ICD-10-CM | POA: Diagnosis not present

## 2022-06-07 DIAGNOSIS — M1990 Unspecified osteoarthritis, unspecified site: Secondary | ICD-10-CM | POA: Insufficient documentation

## 2022-06-07 DIAGNOSIS — I132 Hypertensive heart and chronic kidney disease with heart failure and with stage 5 chronic kidney disease, or end stage renal disease: Secondary | ICD-10-CM | POA: Diagnosis not present

## 2022-06-07 DIAGNOSIS — E1140 Type 2 diabetes mellitus with diabetic neuropathy, unspecified: Secondary | ICD-10-CM | POA: Insufficient documentation

## 2022-06-07 DIAGNOSIS — N186 End stage renal disease: Secondary | ICD-10-CM | POA: Insufficient documentation

## 2022-06-07 DIAGNOSIS — E1122 Type 2 diabetes mellitus with diabetic chronic kidney disease: Secondary | ICD-10-CM | POA: Diagnosis not present

## 2022-06-07 DIAGNOSIS — I5042 Chronic combined systolic (congestive) and diastolic (congestive) heart failure: Secondary | ICD-10-CM | POA: Insufficient documentation

## 2022-06-07 DIAGNOSIS — L97514 Non-pressure chronic ulcer of other part of right foot with necrosis of bone: Secondary | ICD-10-CM | POA: Insufficient documentation

## 2022-06-07 DIAGNOSIS — Z992 Dependence on renal dialysis: Secondary | ICD-10-CM | POA: Diagnosis not present

## 2022-06-07 DIAGNOSIS — J449 Chronic obstructive pulmonary disease, unspecified: Secondary | ICD-10-CM | POA: Diagnosis not present

## 2022-06-07 DIAGNOSIS — E11621 Type 2 diabetes mellitus with foot ulcer: Secondary | ICD-10-CM | POA: Diagnosis not present

## 2022-06-07 DIAGNOSIS — L97512 Non-pressure chronic ulcer of other part of right foot with fat layer exposed: Secondary | ICD-10-CM | POA: Diagnosis not present

## 2022-06-07 NOTE — Progress Notes (Signed)
Morrish, Dannielle Burn ([id number]) [id number] Page 1 of 6 Visit Report for 06/07/2022 Arrival Information Details Patient Name: Date of Service: [place], [name] 06/07/2022 12:00 PM Medical Record Number: [id number] Patient Account Number: [id number] Date of Birth/Sex: Treating RN: October 29, 1970 (51 y.o. [name] Primary Care Juleen Sorrels: Orson Eva Other Clinician: Referring Tasia Liz: Treating Aigner Horseman/Extender: Samara Snide Weeks in Treatment: 37 Visit Information History Since Last Visit Added or deleted any medications: No Patient Arrived: Wheel Chair Has Dressing in Place as Prescribed: Yes Arrival Time: 12:00 Pain Present Now: No Accompanied By: husband Transfer Assistance: None Patient Identification Verified: Yes Secondary Verification Process Completed: Yes Patient Requires Transmission-Based Precautions: No Patient Has Alerts: No Electronic Signature(s) Signed: 06/07/2022 12:55:54 PM By: Midge Aver MSN RN CNS WTA Entered By: Midge Aver on 06/07/2022 12:09:03 -------------------------------------------------------------------------------- Clinic Level of Care Assessment Details Patient Name: Date of Service: [place], [name] 06/07/2022 12:00 PM Medical Record Number: [id number] Patient Account Number: [id number] Date of Birth/Sex: Treating RN: 03/12/1970 (52 y.o. [name] Primary Care Arthur Speagle: Orson Eva Other Clinician: Referring Tannia Contino: Treating Demian Maisel/Extender: Samara Snide Weeks in Treatment: 90 Clinic Level of Care Assessment Items TOOL 1 Quantity Score  - 0 Use when EandM and Procedure is performed on INITIAL visit ASSESSMENTS - Nursing Assessment / Reassessment  - 0 General Physical Exam (combine w/ comprehensive assessment (listed just below) when performed on new pt. evals)  - 0 Comprehensive Assessment (HX, ROS, Risk Assessments, Wounds Hx, etc.) ASSESSMENTS - Wound and  Skin Assessment / Reassessment  - 0 Dermatologic / Skin Assessment (not related to wound area) ASSESSMENTS - Ostomy and/or Continence Assessment and Care  - 0 Incontinence Assessment and Management  - 0 Ostomy Care Assessment and Management (repouching, etc.) PROCESS - Coordination of Care  - 0 Simple Patient / Family Education for ongoing care  - 0 Complex (extensive) Patient / Family Education for ongoing care  - 0 Staff obtains Chiropractor, Records, T Results / Process Orders est  - 0 Staff telephones HHA, Nursing Homes / Clarify orders / etc  - 0 Routine Transfer to another Facility (non-emergent condition)  - 0 Routine Hospital Admission (non-emergent condition)  - 0 New Admissions / Manufacturing engineer / Ordering NPWT Apligraf, etc. ,  - 0 Emergency Hospital Admission (emergent condition) PROCESS - Special Needs  - 0 Pediatric / Minor Patient Management Jividen, Dannielle Burn ([id number]) [id number] Page 2 of 6  - 0 Isolation Patient Management  - 0 Hearing / Language / Visual special needs  - 0 Assessment of Community assistance (transportation, D/C planning, etc.)  - 0 Additional assistance / Altered mentation  - 0 Support Surface(s) Assessment (bed, cushion, seat, etc.) INTERVENTIONS - Miscellaneous  - 0 External ear exam  - 0 Patient Transfer (multiple staff / Nurse, adult / Similar devices)  - 0 Simple Staple / Suture removal (25 or less)  - 0 Complex Staple / Suture removal (26 or more)  - 0 Hypo/Hyperglycemic Management (do not check if billed separately)  - 0 Ankle / Brachial Index (ABI) - do not check if billed separately Has the patient been seen at the hospital within the last three years: Yes Total Score: 0 Level Of Care: ____ Electronic Signature(s) Signed: 06/07/2022 12:55:54 PM By: Midge Aver MSN RN CNS WTA Entered By: Midge Aver on 06/07/2022  12:26:36 -------------------------------------------------------------------------------- Encounter Discharge Information Details Patient Name: Date of Service: [place], [name] 06/07/2022 12:00 PM Medical Record Number: [id number] Patient Account Number: [id number]  Date of Birth/Sex: Treating RN: 1971-01-06 (52 y.o. [name] Primary Care Shuayb Schepers: Orson Eva Other Clinician: Referring Markies Mowatt: Treating Sophie Quiles/Extender: Samara Snide Weeks in Treatment: 13 Encounter Discharge Information Items Post Procedure Vitals Discharge Condition: Stable Temperature (F): 98.2 Ambulatory Status: Wheelchair Pulse (bpm): 102 Discharge Destination: Home Respiratory Rate (breaths/min): 16 Transportation: Private Auto Blood Pressure (mmHg): 133/84 Accompanied By: husband Schedule Follow-up Appointment: Yes Clinical Summary of Care: Electronic Signature(s) Signed: 06/07/2022 12:55:54 PM By: Midge Aver MSN RN CNS WTA Entered By: Midge Aver on 06/07/2022 12:47:47 -------------------------------------------------------------------------------- Lower Extremity Assessment Details Patient Name: Date of Service: [place], [name] 06/07/2022 12:00 PM Medical Record Number: [id number] Patient Account Number: [id number] Date of Birth/Sex: Treating RN: Dec 23, 1970 (52 y.o. [name] Primary Care Elisa Sorlie: Orson Eva Other Clinician: Referring Vaishali Baise: Treating Jaamal Farooqui/Extender: Samara Snide Weeks in Treatment: 43 Electronic Signature(s) Signed: 06/07/2022 12:55:54 PM By: Midge Aver MSN RN CNS WTA Entered By: Midge Aver on 06/07/2022 12:17:35 Liberatore, Dannielle Burn ([id number]) [id number] Page 3 of 6 -------------------------------------------------------------------------------- Multi-Disciplinary Care Plan Details Patient Name: Date of Service: [place], [name] 06/07/2022 12:00 PM Medical Record Number:  [id number] Patient Account Number: [id number] Date of Birth/Sex: Treating RN: Jul 23, 1970 (51 y.o. [name] Primary Care Arwin Bisceglia: Orson Eva Other Clinician: Referring Xariah Silvernail: Treating Jona Zappone/Extender: Samara Snide Weeks in Treatment: 33 Active Inactive Wound/Skin Impairment Nursing Diagnoses: Impaired tissue integrity Knowledge deficit related to ulceration/compromised skin integrity Goals: Ulcer/skin breakdown will have a volume reduction of 30% by week 4 Date Initiated: 05/18/2021 Date Inactivated: 04/15/2022 Target Resolution Date: 06/15/2021 Goal Status: Unmet Unmet Reason: comorbidities Ulcer/skin breakdown will have a volume reduction of 50% by week 8 Date Initiated: 05/18/2021 Target Resolution Date: 07/13/2021 Goal Status: Active Ulcer/skin breakdown will have a volume reduction of 80% by week 12 Date Initiated: 05/18/2021 Target Resolution Date: 08/10/2021 Goal Status: Active Ulcer/skin breakdown will heal within 14 weeks Date Initiated: 05/18/2021 Target Resolution Date: 08/24/2021 Goal Status: Active Interventions: Assess patient/caregiver ability to obtain necessary supplies Assess patient/caregiver ability to perform ulcer/skin care regimen upon admission and as needed Assess ulceration(s) every visit Provide education on ulcer and skin care Notes: Electronic Signature(s) Signed: 06/07/2022 12:55:54 PM By: Midge Aver MSN RN CNS WTA Entered By: Midge Aver on 06/07/2022 12:27:26 -------------------------------------------------------------------------------- Pain Assessment Details Patient Name: Date of Service: [place], [name] 06/07/2022 12:00 PM Medical Record Number: [id number] Patient Account Number: [id number] Date of Birth/Sex: Treating RN: [birth year] (51 y.o. [name] Primary Care Wellington Winegarden: Orson Eva Other Clinician: Referring Skai Lickteig: Treating Ivy Meriwether/Extender: Samara Snide Weeks in Treatment:  22 Active Problems Location of Pain Severity and Description of Pain Patient Has Paino No Site Locations [place], Dannielle Burn ([id number]) [id number] Page 4 of 6 Pain Management and Medication Current Pain Management: Electronic Signature(s) Signed: 06/07/2022 12:55:54 PM By: Midge Aver MSN RN CNS WTA Entered By: Midge Aver on 06/07/2022 12:10:13 -------------------------------------------------------------------------------- Patient/Caregiver Education Details Patient Name: Date of Service: [place], [name] 4/12/2024andnbsp12:00 PM Medical Record Number: [id number] Patient Account Number: [id number] Date of Birth/Gender: Treating RN: 1970/04/22 (51 y.o. [name] Primary Care Physician: Orson Eva Other Clinician: Referring Physician: Treating Physician/Extender: Samara Snide Weeks in Treatment: 32 Education Assessment Education Provided To: Patient Education Topics Provided Wound Debridement: Handouts: Wound Debridement Methods: Explain/Verbal Responses: State content correctly Wound/Skin Impairment: Handouts: Caring for Your Ulcer Methods: Explain/Verbal Responses: State content correctly Electronic Signature(s) Signed: 06/07/2022 12:55:54 PM By: Midge Aver MSN RN CNS WTA Entered  By: Midge Aver on 06/07/2022 12:27:43 -------------------------------------------------------------------------------- Wound Assessment Details Patient Name: Date of Service: [place], [name] 06/07/2022 12:00 PM Medical Record Number: [id number] Patient Account Number: [id number] Date of Birth/Sex: Treating RN: 06/28/1970 (51 y.o. [name] Primary Care Riya Huxford: Orson Eva Other Clinician: Referring Meris Reede: Treating Clyde Upshaw/Extender: Samara Snide Weeks in Treatment: 26 Wound Status Ausborn, Dannielle Burn ([id number]) [id number] Page 5 of 6 Wound Number: 2 Primary Diabetic Wound/Ulcer of  the Lower Extremity Etiology: Wound Location: Right, Circumferential Foot Wound Open Wounding Event: Gradually Appeared Status: Date Acquired: 10/15/2020 Notes: open areas to midline right foot, lateral right foot, and top of right Weeks Of Treatment: 55 foot Clustered Wound: No Comorbid Cataracts, Anemia, Asthma, Chronic Obstructive Pulmonary Pending Amputation On Presentation History: Disease (COPD), Congestive Heart Failure, Hypertension, Type II Diabetes, End Stage Renal Disease, Osteoarthritis, Neuropathy Photos Wound Measurements Length: (cm) 12 Width: (cm) 7.5 Depth: (cm) 0.2 Area: (cm) 70.686 Volume: (cm) 14.137 % Reduction in Area: 33.3% % Reduction in Volume: 93.3% Wound Description Classification: Grade 4 Exudate Amount: Medium Exudate Type: Serosanguineous Exudate Color: red, brown Foul Odor After Cleansing: No Slough/Fibrino Yes Wound Bed Granulation Amount: Medium (34-66%) Exposed Structure Necrotic Amount: Medium (34-66%) Fascia Exposed: No Necrotic Quality: Eschar, Bone Fat Layer (Subcutaneous Tissue) Exposed: Yes Tendon Exposed: No Muscle Exposed: No Joint Exposed: No Bone Exposed: No Treatment Notes Wound #2 (Foot) Wound Laterality: Right, Circumferential Cleanser Normal Saline Discharge Instruction: Wash your hands with soap and water. Remove old dressing, discard into plastic bag and place into trash. Cleanse the wound with Normal Saline prior to applying a clean dressing using gauze sponges, not tissues or cotton balls. Do not scrub or use excessive force. Pat dry using gauze sponges, not tissue or cotton balls. Soap and Water Discharge Instruction: Gently cleanse wound with antibacterial soap, rinse and pat dry prior to dressing wounds Peri-Wound Care Topical Gentamicin Discharge Instruction: Apply to open area of foot Primary Dressing Gauze Discharge Instruction: soak with Betadine and wrap around toes Silvercel 4 1/4x 4 1/4  (in/in) Discharge Instruction: Apply Silvercel 4 1/4x 4 1/4 (in/in) as instructed Secondary Dressing ABD Pad 5x9 (in/in) Discharge Instruction: Cover with ABD pad Duda, Dannielle Burn ([id number]) [id number] Page 6 of 6 Secured With Medipore T - 51M Medipore H Soft Cloth Surgical T ape ape, 2x2 (in/yd) Kerlix Roll Sterile or Non-Sterile 6-ply 4.5x4 (yd/yd) Discharge Instruction: Apply Kerlix as directed Stretch Net Dressing, Latex-free, Size 5, Small-Head / Shoulder / Thigh Compression Wrap Compression Stockings Add-Ons Electronic Signature(s) Signed: 06/07/2022 12:55:54 PM By: Midge Aver MSN RN CNS WTA Entered By: Midge Aver on 06/07/2022 12:35:39 -------------------------------------------------------------------------------- Vitals Details Patient Name: Date of Service: [place], [name] 06/07/2022 12:00 PM Medical Record Number: [id number] Patient Account Number: [id number] Date of Birth/Sex: Treating RN: 07/27/1970 (51 y.o. [name] Primary Care Sruti Ayllon: Orson Eva Other Clinician: Referring Tripton Ned: Treating Zubair Lofton/Extender: Samara Snide Weeks in Treatment: 17 Vital Signs Time Taken: 12:09 Temperature (F): 98.2 Pulse (bpm): 102 Respiratory Rate (breaths/min): 18 Blood Pressure (mmHg): 133/84 Reference Range: 80 - 120 mg / dl Electronic Signature(s) Signed: 06/07/2022 12:55:54 PM By: Midge Aver MSN RN CNS WTA Entered By: Midge Aver on 06/07/2022 12:10:03

## 2022-06-07 NOTE — Progress Notes (Addendum)
[name] ([id number]) [id number] Page 1 of 9 Visit Report for 06/07/2022 Chief Complaint Document Details Patient Name: Date of Service: [name], [name] 06/07/2022 12:00 PM Medical Record Number: [id number] Patient Account Number: [id number] Date of Birth/Sex: Treating RN: 1970/10/15 (51 y.o. [name] Primary Care Provider: Orson [name] Other Clinician: Referring Provider: Treating Provider/Extender: [name] Weeks in Treatment: 23 Information Obtained from: Patient Chief Complaint Right foot gangrene and osteomyelitis Electronic Signature(s) Signed: 06/07/2022 12:13:29 PM By: [name] Derry PA-C Entered By: [name] on 06/07/2022 12:13:28 -------------------------------------------------------------------------------- Debridement Details Patient Name: Date of Service: [place], [name] 06/07/2022 12:00 PM Medical Record Number: [id number] Patient Account Number: [id number] Date of Birth/Sex: Treating RN: [birth year] (51 y.o. [name] Primary Care Provider: Orson [name] Other Clinician: Referring Provider: Treating Provider/Extender: [name] Weeks in Treatment: 59 Debridement Performed for Assessment: Wound #2 Right,Circumferential Foot Performed By: Physician [name] Derry, PA-C Debridement Type: Debridement Severity of Tissue Pre Debridement: Fat layer exposed Level of Consciousness (Pre-procedure): Awake and Alert Pre-procedure Verification/Time Out Yes - 12:23 Taken: Start Time: 12:23 Pain Control: Lidocaine 4% T opical Solution T Area Debrided (L x W): otal 8 (cm) x 7.5 (cm) = 60 (cm) Tissue and other material debrided: Viable, Non-Viable, Callus, Slough, Subcutaneous, Slough Level: Skin/Subcutaneous Tissue Debridement Description: Excisional Instrument: Curette Bleeding: Minimum Hemostasis Achieved: Pressure Procedural Pain: 0 Post Procedural Pain: 0 Response to Treatment:  Procedure was tolerated well Level of Consciousness (Post- Awake and Alert procedure): Post Debridement Measurements of Total Wound Length: (cm) 8 Width: (cm) 7.5 Depth: (cm) 0.2 Volume: (cm) 9.425 Character of Wound/Ulcer Post Debridement: Requires Further Debridement Severity of Tissue Post Debridement: Fat layer exposed Post Procedure Diagnosis Same as Pre-procedure Electronic Signature(s) Signed: 06/07/2022 12:55:54 PM By: [name] Aver MSN RN CNS WTA Signed: 06/07/2022 1:04:02 PM By: [name] Derry PA-C Entered By: [name] on 06/07/2022 12:25:10 [name], [name] ([id number]) [id number] Page 2 of 9 -------------------------------------------------------------------------------- HPI Details Patient Name: Date of Service: [name], [name] 06/07/2022 12:00 PM Medical Record Number: [id number] Patient Account Number: [id number] Date of Birth/Sex: Treating RN: 04-Jan-1971 (51 y.o. [name] Primary Care Provider: Orson [name] Other Clinician: Referring Provider: Treating Provider/Extender: [name] Weeks in Treatment: 53 History of Present Illness HPI Description: 10/04/2020 upon evaluation today patient presents for initial inspection here in the clinic concerning a fairly concerning wound over her right fifth toe towards the medial aspect which actually probes all the way down to bone. There is evidence of necrotic muscle as well and I do see tendon exposed as well. Subsequently this does have me very concerned about where things stand here. Again this is the first time that I am seeing her and on top of everything else her ABIs are noncompressible. I think that arterial flow is the primary concern here. Of note the patient states this was first noted July 1. She had a x-ray on July 4 which was negative for bone infection. She has been on 2 antibiotics she is unsure of the name. With that being said her most recent hemoglobin A1c  was 8.1 that was December 2022. She otherwise does have a history obviously of diabetes mellitus type 2, hypertension, end-stage renal disease with dependence on renal dialysis, and she is also dependent on supplemental oxygen. Readmission: 05/14/2021 this is a patient whom I previously seen October 04, 2020. With that being said she is subsequently on 21 October 2020 ended up having an amputation  of her fifth digit of her foot unfortunately. This had progressed fairly quickly into what sounds to been gangrene at that time. Subsequently right now based on what I am seeing the patient actually has a necrotic area on the end of her foot and has been recommended that the only option really here is going to be a amputation which would be a below-knee amputation. She is really not interested in that and wants to try to give this every chance it can to heal. For that reason I discussed with her that definitely we can see what we can do to try to improve this situation and try to help get this doing better as best we can. With that being said there is definitely a portion of her wound that is just not good to be able to heal no matter what it is already starting to auto amputate. This includes potentially digits of her foot as well based on what I am seeing. She does have confirmed osteomyelitis noted by MRI. She is also currently on IV vancomycin. She does undergo dialysis on Tuesday, Thursday, and Saturday. The patient does have a history of diabetes mellitus type 2, congestive heart failure, hypertension, end-stage renal disease, dependence on renal dialysis, and dependence on supplemental oxygen. With that being said I am going to need to look into her heart failure and the significance of this as far as hyperbarics is concerned before we initiate treatment. She is probably also going to need an updated EKG and chest x-ray unless its already been done recently. 05/28/2021 upon evaluation today patient appears  to be doing well with regard to her wound all things considered. I am going to try to see what we can do about trimmed away some of the necrotic tissue that is really just hanging on here and there and not really helpful. Feel like if we can slowly start doing this maybe we will be able to get to the point where this is better and better as far as cleaning up the surface of the wound is concerned. Still as I explained I do believe some of her toes are not salvageable but again she is looking more towards autoamputation versus want to do anything definitive here from a surgical standpoint. I have been researching whether she would be an okay candidate for hyperbarics or not and I found that her x-ray but she had a chest x-ray recently was negative for any acute disease and appear to be stable. EKG was also stable. She did also have a cardiac echo which showed that her ejection fraction was 60 to 65% and therefore I think she is a candidate for hyperbarics. Again I do believe that this can help with preventing amputation which right now she has been told that her only option would be a below-knee amputation. Nonetheless I think that if we can get the heel leading to spread and the majority of this cleared away that it is possible we might even be able to get a surgeon just to surgically remove the nonviable toes and not have to go the full way of complete amputation. Nonetheless we do have a ways to go before we will get to that point. Either way I think the hyperbarics could be an excellent support as far as that is concerned. 06-05-2021 upon evaluation today patient's wound is showing some signs of improvement. This does seem to be slow and of course it is understandable considering what we are seeing. She has discussed  and looked up information about hyperbarics and in the end comes back today and states that she does want to proceed. I honestly do feel like that is her best chance at saving the foot.  Even if we are able to get her into a surgeon after we obtain some good healing approximately so that there is not as much necrotic tissue in general she may even be able to have a transmetatarsal amputation and get this to heal. Again I am not saying this is can be an easy road but it may be her best road as far as thinking about how to achieve some type of complete healing as I do not believe the toes are viable at all to be honest. 07-06-2021 upon evaluation today patient appears to be doing well currently in regard to her foot all things considered. Again she unfortunately was unable to do the hyperbaric oxygen therapy this was due to her claustrophobia. Nonetheless she tells me that she is pleased with how the foot appears and she does feel like there is good improvement going on here. In general I think that we are on the right track. If she is going to take a very long time and to be honest the route of autoamputation which is pretty much the way this is going is not a quick or easy road but nonetheless she seems to be taking care of her foot quite well. She is in good spirits. 07-27-2021 upon evaluation today patient appears to be doing well currently in regard to her foot all things considered. A lot of the eschar is starting to lift up around the edges which is good there is still significant necrotic tissue on the distal portion of the foot. This is slowly going to work its way off. Based on what I am seeing currently I do not believe that there is any evidence of infection which is good news. 08-31-2021 upon evaluation today patient's wound actually appears to be doing decently well. I do not see anything in particular to try to trim away today. With that being said she continues to slowly allow this to do what is going to do as far as autoamputation is concerned. I did have another conversation with her today about the possibility of seeing a surgeon for a transmetatarsal amputation which again  would help to get this to close faster and obviously I think would be a much speedy year and safer way to proceed with amputation. With that being said she tells me that she would really prefer to just let this go at "God pace". 09-28-2021 upon evaluation today patient's foot actually appears to be doing much better. Fortunately I do not see any signs of infection which is great news and overall I am extremely pleased with where we stand today. There does not appear to be any signs of active infection locally or systemically which is great news. No fevers, chills, nausea, vomiting, or diarrhea. 10-26-2021 upon evaluation today patient appears to be doing well currently in regard to her foot all things considered. Fortunately I do not see any evidence of active infection locally or systemically at this time which is great news. No fevers, chills, nausea, vomiting, or diarrhea. She does have some of the eschar that is lifting up even a little bit more compared to last time I saw her. 11-30-2021 upon evaluation today patient's wound is still continuing to have some areas that lift up although there is nothing really that I feel  like I could trim away too much today to be honest. Fortunately I do not see any evidence of systemic infection though locally she has been concerned about some drainage I do not see anything that seems to be actively red or erythematous or warm to touch which is good news. No fevers, chills, nausea, vomiting, or diarrhea. 12-21-2021 upon evaluation patient's foot ulcer actually is showing signs of some improvement with healthier tissue noted at areas with eschar that keeps clearing off. There is a bit of the eschar that is loosening up today as well and I think we will get a be able to get some of this cleared away that we have been working on for a while. Fortunately I do not see any evidence of infection locally or systemically at this time which is great news. No fevers, chills,  nausea, Jeanlouis, [name] ([id number]) [id number] Page 3 of 9 vomiting, or diarrhea. 01-11-2022 upon evaluation today patient appears to be doing decently well in regard to her foot she still has a necrotic area on the tip of her foot but she is tolerating the dressing changes without complication. Fortunately there does not appear to be any signs of infection locally or systemically at this time which is great news. No fevers, chills, nausea, vomiting, or diarrhea. 02-01-2022 upon evaluation today patient continues to show signs of improvement in regard to the area where we have been able to remove the eschar. Unfortunately the tip of the foot I think is the part that is completely dead and not going to be coming back as far as any healing is concerned. I have discussed this with the patient multiple times however she does not want to have any type of surgical intervention. Obviously in that regard we will try to do what we can as best we can. 02-15-2022 upon evaluation today patient appears to be doing well with regard to her foot. She is actually showing some signs of improvement she has had some drainage but nothing appears to be doing too poorly overall. I do not see any signs of infection and I am pleased in that regard. Fortunately I do not see any evidence of infection locally nor systemically which is great news. 03-01-2022 upon evaluation today patient appears to be doing well currently in regard to her wounds. She has been tolerating the dressing changes without complication. Fortunately I do not see any signs of active infection locally nor systemically which is great news. No fevers, chills, nausea, vomiting, or diarrhea. 03-15-2022 upon evaluation today patient's wound continues to show signs of loosening the splint some of the eschar is concerned on the tip of her foot. Fortunately I do not see any evidence of infection systemically which is good news locally I  feel like she is making good progress here. Overall I am extremely happy with where things stand. Especially considering the fact that really she does need to have an amputation of the necrotic portion of the end of her foot. Again if we got rid of those toes I do think that this could actually heal much more effectively and quickly with that being said she is completely against going down that road so we will try to do what we can as conservatively as we can while still making some progress here. 04-01-2022 upon evaluation today patient's foot unfortunately does not appear to be doing as well as I would like to see. I discussed with her today that I do believe she probably needs  to be very mindful of this and how things are progressing. I explained as well that this is what I been most worried about the whole time that I been seeing her since really at least March of last year when I started seeing her again. Subsequently she notes that she has had more drainage also feel that the drainage is more discolored which is not good. 04-08-2022 upon evaluation today patient appears to be doing well currently in regard to her foot ulcer this is much better than last week's evaluation. Fortunately I do not see any signs of active infection systemically though locally we still see some drainage I think this is much better with the doxycycline she has been taking that since we got the results back of the culture nothing has really changed in that regard. Fortunately I think that we are on the right track. 04-15-2022 upon evaluation today patient unfortunately appears to be doing a bit worse compared to her normal. She tells me that she has been sick with what she presumed might have been a GI bug over the weekend but to be honest I am not so certain that she could not be showing early signs of sepsis. Her foot is starting to become a little bit more purulent as far as the drainage is concerned of hide on doxycycline  for a couple weeks already which seemed to help initially to a degree but unfortunately I am just not sure this is maintaining. It started to get a little bit more weight around the edges and is starting to especially around the lateral toe show signs of progression at this point. I am coupled with the fact that her blood pressure is low today compared to her normal concerned that we should probably have her go to the ER for further evaluation and treatment in order to evaluate for the possibility of early sepsis. This is something we have had a close eye on for quite some time and it is something that I think needs to be fully evaluated at this point. 05-17-2022 upon evaluation today patient's wound is continuing to maintain stability for the most part. I do not see any signs of infection at this point which is good news I am pleased with that. The tip of her foot where she has gangrene also has remained stable and dry which is also good news. Overall I am pleased with what we are seeing today. 06-07-2022 upon evaluation today patient appears to be doing well currently in regard to her wound this is actually showing signs of improvement except for the distal portion where she has the necrotic tissue which again is not going to change. Fortunately I do not see any signs of active infection locally nor systemically which is great news. No fevers, chills, nausea, vomiting, or diarrhea. Electronic Signature(s) Signed: 06/07/2022 12:52:55 PM By: [name] Derry PA-C Entered By: [name] on 06/07/2022 12:52:54 -------------------------------------------------------------------------------- Physical Exam Details Patient Name: Date of Service: [place], [name] 06/07/2022 12:00 PM Medical Record Number: [id number] Patient Account Number: [id number] Date of Birth/Sex: Treating RN: 11-Dec-1970 (51 y.o. [name] Primary Care Provider: Orson [name] Other Clinician: Referring Provider: Treating  Provider/Extender: [name] Weeks in Treatment: 40 Constitutional Well-nourished and well-hydrated in no acute distress. Respiratory normal breathing without difficulty. Psychiatric this patient is able to make decisions and demonstrates good insight into disease process. Alert and Oriented x 3. pleasant and cooperative. Notes Upon inspection patient's wound bed actually showed signs  of good granulation and a good portion of the wound she does still have a lot of necrotic tissue and gangrene on the tip of the foot. This again is something that is going to be an ongoing issue I do not think that is ever going to be an area that can heal. Electronic Signature(s) Signed: 06/07/2022 12:53:16 PM By: [name] Derry PA-C [name], [name] ([id number]) [id number] Page 4 of 9 Entered By: [name] on 06/07/2022 12:53:16 -------------------------------------------------------------------------------- Physician Orders Details Patient Name: Date of Service: [place], [name] 06/07/2022 12:00 PM Medical Record Number: [id number] Patient Account Number: [id number] Date of Birth/Sex: Treating RN: 05-Sep-1970 (51 y.o. [name] Primary Care Provider: Orson [name] Other Clinician: Referring Provider: Treating Provider/Extender: [name] Weeks in Treatment: 38 Verbal / Phone Orders: No Diagnosis Coding ICD-10 Coding Code Description M86.371 Chronic multifocal osteomyelitis, right ankle and foot E11.621 Type 2 diabetes mellitus with foot ulcer L97.514 Non-pressure chronic ulcer of other part of right foot with necrosis of bone I50.42 Chronic combined systolic (congestive) and diastolic (congestive) heart failure I10 Essential (primary) hypertension N18.6 End stage renal disease Z99.2 Dependence on renal dialysis Z99.81 Dependence on supplemental oxygen Follow-up Appointments Return Appointment in 3 weeks. Nurse Visit as  needed Home Health Home Health Company: - [facility] for wound care. May utilize formulary equivalent dressing for wound treatment orders unless otherwise specified. Home Health Nurse may visit PRN to address patients wound care needs. Scheduled days for dressing changes to be completed; exception, patient has scheduled wound care visit that day. **Please direct any NON-WOUND related issues/requests for orders to patient's Primary Care Physician. **If current dressing causes regression in wound condition, may D/C ordered dressing product/s and apply Normal Saline Moist Dressing daily until next Wound Healing Center or Other MD appointment. **Notify Wound Healing Center of regression in wound condition at [phone]. Bathing/ Shower/ Hygiene Clean wound with Normal Saline or wound cleanser. Wash wounds with antibacterial soap and water. May shower; gently cleanse wound with antibacterial soap, rinse and pat dry prior to dressing wounds No tub bath. Off-Loading Open toe surgical shoe Medications-Please add to medication list. ntibiotics - continue Doxycycline P.O. A ntibiotic - gentamycin to open area of foot Topical A Wound Treatment Wound #2 - Foot Wound Laterality: Right, Circumferential Cleanser: Normal Saline 1 x Per Day/30 Days Discharge Instructions: Wash your hands with soap and water. Remove old dressing, discard into plastic bag and place into trash. Cleanse the wound with Normal Saline prior to applying a clean dressing using gauze sponges, not tissues or cotton balls. Do not scrub or use excessive force. Pat dry using gauze sponges, not tissue or cotton balls. Cleanser: Soap and Water 1 x Per Day/30 Days Discharge Instructions: Gently cleanse wound with antibacterial soap, rinse and pat dry prior to dressing wounds Topical: Gentamicin 1 x Per Day/30 Days Discharge Instructions: Apply to open area of foot Prim Dressing: Gauze 1 x Per Day/30  Days ary Discharge Instructions: soak with Betadine and wrap around toes Prim Dressing: Silvercel 4 1/4x 4 1/4 (in/in) 1 x Per Day/30 Days ary Discharge Instructions: Apply Silvercel 4 1/4x 4 1/4 (in/in) as instructed Secondary Dressing: ABD Pad 5x9 (in/in) 1 x Per Day/30 Days Discharge Instructions: Cover with ABD pad Secured With: Medipore T - 60M Medipore H Soft Cloth Surgical T ape ape, 2x2 (in/yd) 1 x Per Day/30 Days Crumpler, [name] ([id number]) [id number] Page 5 of 9 Secured With: [employer] or Non-Sterile 6-ply  4.5x4 (yd/yd) 1 x Per Day/30 Days Discharge Instructions: Apply Kerlix as directed Secured With: Stretch Net Dressing, Latex-free, Size 5, Small-Head / Shoulder / Thigh 1 x Per Day/30 Days Electronic Signature(s) Signed: 06/07/2022 12:55:54 PM By: [name] Aver MSN RN CNS WTA Signed: 06/07/2022 1:04:02 PM By: [name] Derry PA-C Entered By: [name] on 06/07/2022 12:26:24 -------------------------------------------------------------------------------- Problem List Details Patient Name: Date of Service: [place], [name] 06/07/2022 12:00 PM Medical Record Number: [id number] Patient Account Number: [id number] Date of Birth/Sex: Treating RN: Dec 16, 1970 (51 y.o. [name] Primary Care Provider: Orson [name] Other Clinician: Referring Provider: Treating Provider/Extender: [name] Weeks in Treatment: 85 Active Problems ICD-10 Encounter Code Description Active Date MDM Diagnosis M86.371 Chronic multifocal osteomyelitis, right ankle and foot 05/18/2021 No Yes E11.621 Type 2 diabetes mellitus with foot ulcer 05/18/2021 No Yes L97.514 Non-pressure chronic ulcer of other part of right foot with necrosis of bone 05/18/2021 No Yes I50.42 Chronic combined systolic (congestive) and diastolic (congestive) heart failure 05/18/2021 No Yes I10 Essential (primary) hypertension 05/18/2021 No Yes N18.6 End stage renal disease  05/18/2021 No Yes Z99.2 Dependence on renal dialysis 05/18/2021 No Yes Z99.81 Dependence on supplemental oxygen 05/18/2021 No Yes Inactive Problems Resolved Problems Electronic Signature(s) Signed: 06/07/2022 12:13:26 PM By: [name] Derry PA-C Entered By: [name] on 06/07/2022 12:13:26 Kubin, [name] ([id number]) [id number] Page 6 of 9 -------------------------------------------------------------------------------- Progress Note Details Patient Name: Date of Service: [place], [name] 06/07/2022 12:00 PM Medical Record Number: [id number] Patient Account Number: [id number] Date of Birth/Sex: Treating RN: 11-07-1970 (51 y.o. [name] Primary Care Provider: Orson [name] Other Clinician: Referring Provider: Treating Provider/Extender: [name] Weeks in Treatment: 44 Subjective Chief Complaint Information obtained from Patient Right foot gangrene and osteomyelitis History of Present Illness (HPI) 10/04/2020 upon evaluation today patient presents for initial inspection here in the clinic concerning a fairly concerning wound over her right fifth toe towards the medial aspect which actually probes all the way down to bone. There is evidence of necrotic muscle as well and I do see tendon exposed as well. Subsequently this does have me very concerned about where things stand here. Again this is the first time that I am seeing her and on top of everything else her ABIs are noncompressible. I think that arterial flow is the primary concern here. Of note the patient states this was first noted July 1. She had a x-ray on July 4 which was negative for bone infection. She has been on 2 antibiotics she is unsure of the name. With that being said her most recent hemoglobin A1c was 8.1 that was December 2022. She otherwise does have a history obviously of diabetes mellitus type 2, hypertension, end-stage renal disease with dependence on renal  dialysis, and she is also dependent on supplemental oxygen. Readmission: 05/14/2021 this is a patient whom I previously seen October 04, 2020. With that being said she is subsequently on 21 October 2020 ended up having an amputation of her fifth digit of her foot unfortunately. This had progressed fairly quickly into what sounds to been gangrene at that time. Subsequently right now based on what I am seeing the patient actually has a necrotic area on the end of her foot and has been recommended that the only option really here is going to be a amputation which would be a below-knee amputation. She is really not interested in that and wants to try to give this every chance it can to heal. For  that reason I discussed with her that definitely we can see what we can do to try to improve this situation and try to help get this doing better as best we can. With that being said there is definitely a portion of her wound that is just not good to be able to heal no matter what it is already starting to auto amputate. This includes potentially digits of her foot as well based on what I am seeing. She does have confirmed osteomyelitis noted by MRI. She is also currently on IV vancomycin. She does undergo dialysis on Tuesday, Thursday, and Saturday. The patient does have a history of diabetes mellitus type 2, congestive heart failure, hypertension, end-stage renal disease, dependence on renal dialysis, and dependence on supplemental oxygen. With that being said I am going to need to look into her heart failure and the significance of this as far as hyperbarics is concerned before we initiate treatment. She is probably also going to need an updated EKG and chest x-ray unless its already been done recently. 05/28/2021 upon evaluation today patient appears to be doing well with regard to her wound all things considered. I am going to try to see what we can do about trimmed away some of the necrotic tissue that is really  just hanging on here and there and not really helpful. Feel like if we can slowly start doing this maybe we will be able to get to the point where this is better and better as far as cleaning up the surface of the wound is concerned. Still as I explained I do believe some of her toes are not salvageable but again she is looking more towards autoamputation versus want to do anything definitive here from a surgical standpoint. I have been researching whether she would be an okay candidate for hyperbarics or not and I found that her x-ray but she had a chest x-ray recently was negative for any acute disease and appear to be stable. EKG was also stable. She did also have a cardiac echo which showed that her ejection fraction was 60 to 65% and therefore I think she is a candidate for hyperbarics. Again I do believe that this can help with preventing amputation which right now she has been told that her only option would be a below-knee amputation. Nonetheless I think that if we can get the heel leading to spread and the majority of this cleared away that it is possible we might even be able to get a surgeon just to surgically remove the nonviable toes and not have to go the full way of complete amputation. Nonetheless we do have a ways to go before we will get to that point. Either way I think the hyperbarics could be an excellent support as far as that is concerned. 06-05-2021 upon evaluation today patient's wound is showing some signs of improvement. This does seem to be slow and of course it is understandable considering what we are seeing. She has discussed and looked up information about hyperbarics and in the end comes back today and states that she does want to proceed. I honestly do feel like that is her best chance at saving the foot. Even if we are able to get her into a surgeon after we obtain some good healing approximately so that there is not as much necrotic tissue in general she may even be  able to have a transmetatarsal amputation and get this to heal. Again I am not saying this is  can be an easy road but it may be her best road as far as thinking about how to achieve some type of complete healing as I do not believe the toes are viable at all to be honest. 07-06-2021 upon evaluation today patient appears to be doing well currently in regard to her foot all things considered. Again she unfortunately was unable to do the hyperbaric oxygen therapy this was due to her claustrophobia. Nonetheless she tells me that she is pleased with how the foot appears and she does feel like there is good improvement going on here. In general I think that we are on the right track. If she is going to take a very long time and to be honest the route of autoamputation which is pretty much the way this is going is not a quick or easy road but nonetheless she seems to be taking care of her foot quite well. She is in good spirits. 07-27-2021 upon evaluation today patient appears to be doing well currently in regard to her foot all things considered. A lot of the eschar is starting to lift up around the edges which is good there is still significant necrotic tissue on the distal portion of the foot. This is slowly going to work its way off. Based on what I am seeing currently I do not believe that there is any evidence of infection which is good news. 08-31-2021 upon evaluation today patient's wound actually appears to be doing decently well. I do not see anything in particular to try to trim away today. With that being said she continues to slowly allow this to do what is going to do as far as autoamputation is concerned. I did have another conversation with her today about the possibility of seeing a surgeon for a transmetatarsal amputation which again would help to get this to close faster and obviously I think would be a much speedy year and safer way to proceed with amputation. With that being said she tells me  that she would really prefer to just let this go at "God pace". 09-28-2021 upon evaluation today patient's foot actually appears to be doing much better. Fortunately I do not see any signs of infection which is great news and overall I am extremely pleased with where we stand today. There does not appear to be any signs of active infection locally or systemically which is great news. No fevers, chills, nausea, vomiting, or diarrhea. 10-26-2021 upon evaluation today patient appears to be doing well currently in regard to her foot all things considered. Fortunately I do not see any evidence of active infection locally or systemically at this time which is great news. No fevers, chills, nausea, vomiting, or diarrhea. She does have some of the eschar that is lifting up even a little bit more compared to last time I saw her. 11-30-2021 upon evaluation today patient's wound is still continuing to have some areas that lift up although there is nothing really that I feel like I could trim away too much today to be honest. Fortunately I do not see any evidence of systemic infection though locally she has been concerned about some drainage I do not see anything that seems to be actively red or erythematous or warm to touch which is good news. No fevers, chills, nausea, vomiting, or diarrhea. 12-21-2021 upon evaluation patient's foot ulcer actually is showing signs of some improvement with healthier tissue noted at areas with eschar that keeps [employer], [name] ([id number]) [id number] Page  7 of 9 clearing off. There is a bit of the eschar that is loosening up today as well and I think we will get a be able to get some of this cleared away that we have been working on for a while. Fortunately I do not see any evidence of infection locally or systemically at this time which is great news. No fevers, chills, nausea, vomiting, or diarrhea. 01-11-2022 upon evaluation today patient appears to be  doing decently well in regard to her foot she still has a necrotic area on the tip of her foot but she is tolerating the dressing changes without complication. Fortunately there does not appear to be any signs of infection locally or systemically at this time which is great news. No fevers, chills, nausea, vomiting, or diarrhea. 02-01-2022 upon evaluation today patient continues to show signs of improvement in regard to the area where we have been able to remove the eschar. Unfortunately the tip of the foot I think is the part that is completely dead and not going to be coming back as far as any healing is concerned. I have discussed this with the patient multiple times however she does not want to have any type of surgical intervention. Obviously in that regard we will try to do what we can as best we can. 02-15-2022 upon evaluation today patient appears to be doing well with regard to her foot. She is actually showing some signs of improvement she has had some drainage but nothing appears to be doing too poorly overall. I do not see any signs of infection and I am pleased in that regard. Fortunately I do not see any evidence of infection locally nor systemically which is great news. 03-01-2022 upon evaluation today patient appears to be doing well currently in regard to her wounds. She has been tolerating the dressing changes without complication. Fortunately I do not see any signs of active infection locally nor systemically which is great news. No fevers, chills, nausea, vomiting, or diarrhea. 03-15-2022 upon evaluation today patient's wound continues to show signs of loosening the splint some of the eschar is concerned on the tip of her foot. Fortunately I do not see any evidence of infection systemically which is good news locally I feel like she is making good progress here. Overall I am extremely happy with where things stand. Especially considering the fact that really she does need to have an  amputation of the necrotic portion of the end of her foot. Again if we got rid of those toes I do think that this could actually heal much more effectively and quickly with that being said she is completely against going down that road so we will try to do what we can as conservatively as we can while still making some progress here. 04-01-2022 upon evaluation today patient's foot unfortunately does not appear to be doing as well as I would like to see. I discussed with her today that I do believe she probably needs to be very mindful of this and how things are progressing. I explained as well that this is what I been most worried about the whole time that I been seeing her since really at least March of last year when I started seeing her again. Subsequently she notes that she has had more drainage also feel that the drainage is more discolored which is not good. 04-08-2022 upon evaluation today patient appears to be doing well currently in regard to her foot ulcer this is much  better than last week's evaluation. Fortunately I do not see any signs of active infection systemically though locally we still see some drainage I think this is much better with the doxycycline she has been taking that since we got the results back of the culture nothing has really changed in that regard. Fortunately I think that we are on the right track. 04-15-2022 upon evaluation today patient unfortunately appears to be doing a bit worse compared to her normal. She tells me that she has been sick with what she presumed might have been a GI bug over the weekend but to be honest I am not so certain that she could not be showing early signs of sepsis. Her foot is starting to become a little bit more purulent as far as the drainage is concerned of hide on doxycycline for a couple weeks already which seemed to help initially to a degree but unfortunately I am just not sure this is maintaining. It started to get a little bit more  weight around the edges and is starting to especially around the lateral toe show signs of progression at this point. I am coupled with the fact that her blood pressure is low today compared to her normal concerned that we should probably have her go to the ER for further evaluation and treatment in order to evaluate for the possibility of early sepsis. This is something we have had a close eye on for quite some time and it is something that I think needs to be fully evaluated at this point. 05-17-2022 upon evaluation today patient's wound is continuing to maintain stability for the most part. I do not see any signs of infection at this point which is good news I am pleased with that. The tip of her foot where she has gangrene also has remained stable and dry which is also good news. Overall I am pleased with what we are seeing today. 06-07-2022 upon evaluation today patient appears to be doing well currently in regard to her wound this is actually showing signs of improvement except for the distal portion where she has the necrotic tissue which again is not going to change. Fortunately I do not see any signs of active infection locally nor systemically which is great news. No fevers, chills, nausea, vomiting, or diarrhea. Objective Constitutional Well-nourished and well-hydrated in no acute distress. Vitals Time Taken: 12:09 PM, Temperature: 98.2 F, Pulse: 102 bpm, Respiratory Rate: 18 breaths/min, Blood Pressure: 133/84 mmHg. Respiratory normal breathing without difficulty. Psychiatric this patient is able to make decisions and demonstrates good insight into disease process. Alert and Oriented x 3. pleasant and cooperative. General Notes: Upon inspection patient's wound bed actually showed signs of good granulation and a good portion of the wound she does still have a lot of necrotic tissue and gangrene on the tip of the foot. This again is something that is going to be an ongoing issue I do not  think that is ever going to be an area that can heal. Integumentary (Hair, Skin) Wound #2 status is Open. Original cause of wound was Gradually Appeared. The date acquired was: 10/15/2020. The wound has been in treatment 55 weeks. The wound is located on the Right,Circumferential Foot. The wound measures 12cm length x 7.5cm width x 0.2cm depth; 70.686cm^2 area and 14.137cm^3 volume. There is Fat Layer (Subcutaneous Tissue) exposed. There is a medium amount of serosanguineous drainage noted. There is medium (34-66%) granulation within the wound bed. There is a medium (34-66%) amount  of necrotic tissue within the wound bed including Eschar and Necrosis of Bone. 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 There was a Excisional Skin/Subcutaneous Tissue Debridement with a total area of 60 sq cm performed by [name] Derry, PA-C. With the following instrument(s): Curette to remove Viable and Non-Viable tissue/material. Material removed includes Callus, Subcutaneous Tissue, and Slough after achieving pain control using Lidocaine 4% Topical Solution. No specimens were taken. A time out was conducted at 12:23, prior to the start of the procedure. A Minimum amount of bleeding was controlled with Pressure. The procedure was tolerated well with a pain level of 0  throughout and a pain level of 0 following the procedure. Post Debridement Measurements: 8cm length x 7.5cm width x 0.2cm depth; 9.425cm^3 volume. Character of Wound/Ulcer Post Debridement requires further debridement. Severity of Tissue Post Debridement is: Fat layer exposed. Post procedure Diagnosis Wound #2: Same as Pre-Procedure Plan Follow-up Appointments: Return Appointment in 3 weeks. Nurse Visit as needed Home Health: Home Health Company: - [facility] for wound care. May utilize formulary equivalent dressing for wound treatment orders unless otherwise specified. Home Health Nurse may visit PRN to address patientoos wound care needs. Scheduled days for dressing changes to be completed; exception, patient has scheduled wound care visit that day. **Please direct any NON-WOUND related issues/requests for orders to patient's Primary Care Physician. **If current dressing causes regression in wound condition, may D/C ordered dressing product/s and apply Normal Saline Moist Dressing daily until next Wound Healing Center or Other MD appointment. **Notify Wound Healing Center of regression in wound condition at [phone]. Bathing/ Shower/ Hygiene: Clean wound with Normal Saline or wound cleanser. Wash wounds with antibacterial soap and water. May shower; gently cleanse wound with antibacterial soap, rinse and pat dry prior to dressing wounds No tub bath. Off-Loading: Open toe surgical shoe Medications-Please add to medication list.: P.O. Antibiotics - continue Doxycycline Topical Antibiotic - gentamycin to open area of foot WOUND #2: - Foot Wound Laterality: Right, Circumferential Cleanser: Normal Saline 1 x Per Day/30 Days Discharge Instructions: Wash your hands with soap and water. Remove old dressing, discard into plastic bag and place into trash. Cleanse the wound with Normal Saline prior to applying a clean dressing using gauze sponges, not tissues or cotton  balls. Do not scrub or use excessive force. Pat dry using gauze sponges, not tissue or cotton balls. Cleanser: Soap and Water 1 x Per Day/30 Days Discharge Instructions: Gently cleanse wound with antibacterial soap, rinse and pat dry prior to dressing wounds Topical: Gentamicin 1 x Per Day/30 Days Discharge Instructions: Apply to open area of foot Prim Dressing: Gauze 1 x Per Day/30 Days ary Discharge Instructions: soak with Betadine and wrap around toes Prim Dressing: Silvercel 4 1/4x 4 1/4 (in/in) 1 x Per Day/30 Days ary Discharge Instructions: Apply Silvercel 4 1/4x 4 1/4 (in/in) as instructed Secondary Dressing: ABD Pad 5x9 (in/in) 1 x Per Day/30 Days Discharge Instructions: Cover with ABD pad Secured With: Medipore T -  77M Medipore H Soft Cloth Surgical T ape ape, 2x2 (in/yd) 1 x Per Day/30 Days Secured With: Kerlix Roll Sterile or Non-Sterile 6-ply 4.5x4 (yd/yd) 1 x Per Day/30 Days Discharge Instructions: Apply Kerlix as directed Secured With: Stretch Net Dressing, Latex-free, Size 5, Small-Head / Shoulder / Thigh 1 x Per Day/30 Days 1. Based on what I am seeing I do believe that the patient is making progress here all things considered I am very pleased. Fortunately I do not see any evidence of active infection locally nor systemically which is great news. 2. I am going to continue with the wound care measures as before this includes the silver cell along with the gentamicin to the open wound of the foot. We will see patient back for reevaluation in 2 weeks here in the clinic. If anything worsens or changes patient will contact our office for additional recommendations. [name], [name] ([id number]) [id number] Page 9 of 9 Electronic Signature(s) Signed: 06/07/2022 12:54:09 PM By: [name] Derry PA-C Entered By: [name] on 06/07/2022 12:54:09 -------------------------------------------------------------------------------- SuperBill Details Patient Name:  Date of Service: [place], [name] 06/07/2022 Medical Record Number: [id number] Patient Account Number: [id number] Date of Birth/Sex: Treating RN: [birth year] (51 y.o. [name] Primary Care Provider: Orson [name] Other Clinician: Referring Provider: Treating Provider/Extender: [name] Weeks in Treatment: 16 Diagnosis Coding ICD-10 Codes Code Description M86.371 Chronic multifocal osteomyelitis, right ankle and foot E11.621 Type 2 diabetes mellitus with foot ulcer L97.514 Non-pressure chronic ulcer of other part of right foot with necrosis of bone I50.42 Chronic combined systolic (congestive) and diastolic (congestive) heart failure I10 Essential (primary) hypertension N18.6 End stage renal disease Z99.2 Dependence on renal dialysis Z99.81 Dependence on supplemental oxygen Facility Procedures : 3 CPT4 Code: 7846962 Description: 11042 - DEB SUBQ TISSUE 20 SQ CM/< ICD-10 Diagnosis Description L97.514 Non-pressure chronic ulcer of other part of right foot with necrosis of bone Modifier: Quantity: 1 : 3 CPT4 Code: 9528413 Description: 11045 - DEB SUBQ TISS EA ADDL 20CM ICD-10 Diagnosis Description L97.514 Non-pressure chronic ulcer of other part of right foot with necrosis of bone Modifier: Quantity: 2 Physician Procedures : CPT4 Code Description Modifier [id number] 11042 - WC PHYS SUBQ TISS 20 SQ CM ICD-10 Diagnosis Description L97.514 Non-pressure chronic ulcer of other part of right foot with necrosis of bone Quantity: 1 : [id number] 11045 - WC PHYS SUBQ TISS EA ADDL 20 CM ICD-10 Diagnosis Description L97.514 Non-pressure chronic ulcer of other part of right foot with necrosis of bone Quantity: 2 Electronic Signature(s) Signed: 06/07/2022 12:54:26 PM By: [name] Derry PA-C Entered By: [name] on 06/07/2022 12:54:26

## 2022-06-08 DIAGNOSIS — M86171 Other acute osteomyelitis, right ankle and foot: Secondary | ICD-10-CM | POA: Diagnosis not present

## 2022-06-08 DIAGNOSIS — N186 End stage renal disease: Secondary | ICD-10-CM | POA: Diagnosis not present

## 2022-06-08 DIAGNOSIS — N2581 Secondary hyperparathyroidism of renal origin: Secondary | ICD-10-CM | POA: Diagnosis not present

## 2022-06-08 DIAGNOSIS — Z992 Dependence on renal dialysis: Secondary | ICD-10-CM | POA: Diagnosis not present

## 2022-06-10 ENCOUNTER — Ambulatory Visit: Payer: 59 | Attending: Anesthesiology | Admitting: Anesthesiology

## 2022-06-10 ENCOUNTER — Encounter: Payer: Self-pay | Admitting: Anesthesiology

## 2022-06-10 VITALS — BP 108/65 | HR 102 | Temp 97.4°F | Resp 16 | Ht 64.0 in | Wt 264.0 lb

## 2022-06-10 DIAGNOSIS — Z992 Dependence on renal dialysis: Secondary | ICD-10-CM | POA: Diagnosis not present

## 2022-06-10 DIAGNOSIS — L97513 Non-pressure chronic ulcer of other part of right foot with necrosis of muscle: Secondary | ICD-10-CM | POA: Diagnosis not present

## 2022-06-10 DIAGNOSIS — M62830 Muscle spasm of back: Secondary | ICD-10-CM | POA: Insufficient documentation

## 2022-06-10 DIAGNOSIS — J9611 Chronic respiratory failure with hypoxia: Secondary | ICD-10-CM | POA: Diagnosis not present

## 2022-06-10 DIAGNOSIS — E1165 Type 2 diabetes mellitus with hyperglycemia: Secondary | ICD-10-CM | POA: Diagnosis not present

## 2022-06-10 DIAGNOSIS — M47816 Spondylosis without myelopathy or radiculopathy, lumbar region: Secondary | ICD-10-CM | POA: Diagnosis not present

## 2022-06-10 DIAGNOSIS — M48061 Spinal stenosis, lumbar region without neurogenic claudication: Secondary | ICD-10-CM

## 2022-06-10 DIAGNOSIS — I5032 Chronic diastolic (congestive) heart failure: Secondary | ICD-10-CM | POA: Diagnosis not present

## 2022-06-10 DIAGNOSIS — Z794 Long term (current) use of insulin: Secondary | ICD-10-CM | POA: Diagnosis not present

## 2022-06-10 DIAGNOSIS — E1169 Type 2 diabetes mellitus with other specified complication: Secondary | ICD-10-CM | POA: Diagnosis not present

## 2022-06-10 DIAGNOSIS — G8929 Other chronic pain: Secondary | ICD-10-CM

## 2022-06-10 DIAGNOSIS — Z7985 Long-term (current) use of injectable non-insulin antidiabetic drugs: Secondary | ICD-10-CM | POA: Diagnosis not present

## 2022-06-10 DIAGNOSIS — G894 Chronic pain syndrome: Secondary | ICD-10-CM | POA: Diagnosis not present

## 2022-06-10 DIAGNOSIS — M5441 Lumbago with sciatica, right side: Secondary | ICD-10-CM | POA: Insufficient documentation

## 2022-06-10 DIAGNOSIS — E1151 Type 2 diabetes mellitus with diabetic peripheral angiopathy without gangrene: Secondary | ICD-10-CM | POA: Diagnosis not present

## 2022-06-10 DIAGNOSIS — E872 Acidosis, unspecified: Secondary | ICD-10-CM | POA: Diagnosis not present

## 2022-06-10 DIAGNOSIS — I132 Hypertensive heart and chronic kidney disease with heart failure and with stage 5 chronic kidney disease, or end stage renal disease: Secondary | ICD-10-CM | POA: Diagnosis not present

## 2022-06-10 DIAGNOSIS — M86371 Chronic multifocal osteomyelitis, right ankle and foot: Secondary | ICD-10-CM | POA: Diagnosis not present

## 2022-06-10 DIAGNOSIS — J441 Chronic obstructive pulmonary disease with (acute) exacerbation: Secondary | ICD-10-CM | POA: Diagnosis not present

## 2022-06-10 DIAGNOSIS — I951 Orthostatic hypotension: Secondary | ICD-10-CM | POA: Diagnosis not present

## 2022-06-10 DIAGNOSIS — E11621 Type 2 diabetes mellitus with foot ulcer: Secondary | ICD-10-CM | POA: Diagnosis not present

## 2022-06-10 DIAGNOSIS — I70203 Unspecified atherosclerosis of native arteries of extremities, bilateral legs: Secondary | ICD-10-CM | POA: Diagnosis not present

## 2022-06-10 DIAGNOSIS — N186 End stage renal disease: Secondary | ICD-10-CM | POA: Diagnosis not present

## 2022-06-10 DIAGNOSIS — Z9981 Dependence on supplemental oxygen: Secondary | ICD-10-CM | POA: Diagnosis not present

## 2022-06-10 DIAGNOSIS — E1122 Type 2 diabetes mellitus with diabetic chronic kidney disease: Secondary | ICD-10-CM | POA: Diagnosis not present

## 2022-06-10 DIAGNOSIS — J9612 Chronic respiratory failure with hypercapnia: Secondary | ICD-10-CM | POA: Diagnosis not present

## 2022-06-10 DIAGNOSIS — D631 Anemia in chronic kidney disease: Secondary | ICD-10-CM | POA: Diagnosis not present

## 2022-06-10 MED ORDER — ROPIVACAINE HCL 2 MG/ML IJ SOLN: 10.0000 mL | Freq: Once | INTRAMUSCULAR | Status: DC

## 2022-06-10 MED ORDER — DEXAMETHASONE SODIUM PHOSPHATE 10 MG/ML IJ SOLN
10.0000 mg | Freq: Once | INTRAMUSCULAR | Status: AC
  Administered 2022-06-10: 10 mg

## 2022-06-10 MED ORDER — DEXAMETHASONE SODIUM PHOSPHATE 10 MG/ML IJ SOLN
INTRAMUSCULAR | Status: AC
  Filled 2022-06-10: qty 1

## 2022-06-10 MED ORDER — ROPIVACAINE HCL 2 MG/ML IJ SOLN
INTRAMUSCULAR | Status: AC
  Filled 2022-06-10: qty 20

## 2022-06-10 NOTE — Progress Notes (Signed)
Subjective:  Patient ID: [name], female    DOB: 10-Apr-1970  Age: 52 y.o. MRN: [id number]  CC: Back Pain (lower)   Procedure: Trigger point injection at the lumbar level L5 left side 2 cm from the left of midline and 3 injections at 2 4 and 6 cm to the right of midline all at the L5 level  HPI [name] presents for reevaluation.  She was last seen a few weeks ago and continues to have severe spasming pain of the lumbar region.  This pain radiates into the right more frequently than the left hip region and right lateral leg.  She states that over the past week or 2 the pains been worse on the left side but generally is sequestered to the lumbar region with radiation and spasming into the right hip buttock region and occasionally right posterior lateral thigh.  She has some giveaway weakness with walking but is otherwise confined to a wheelchair secondary to body habitus and chronic pain.  She presents today in a wheelchair with her husband maintaining that the pain that she has been experiencing has been intractable.  It affects her sleep and limits her activity.  She has tried tramadol but this has been minimally effective.  Tylenol has been effective to a little mild extent.  She is limited on anti-inflammatory secondary to chronic kidney disease on dialysis.  Furthermore, she is on home O2 secondary to chronic respiratory dysfunction and has recently been on IV antibiotics secondary to an underlying infection.  Outpatient Medications Prior to Visit  Medication Sig Dispense Refill   acetaminophen (TYLENOL) 500 MG tablet Take 1,000 mg by mouth every 6 (six) hours as needed for mild pain, fever or headache.     albuterol (PROVENTIL) (2.5 MG/3ML) 0.083% nebulizer solution Take 3 mLs (2.5 mg total) by nebulization every 6 (six) hours as needed for wheezing or shortness of breath. 75 mL 11   albuterol (VENTOLIN HFA) 108 (90 Base) MCG/ACT inhaler Inhale 2 puffs into the lungs every 6 (six)  hours as needed for wheezing or shortness of breath. 18 g 2   aspirin EC 81 MG tablet Take 81 mg by mouth daily.      atorvastatin (LIPITOR) 40 MG tablet Take 1 tablet (40 mg total) by mouth daily. 90 tablet 3   benzonatate (TESSALON PERLES) 100 MG capsule Take 1 capsule (100 mg total) by mouth 3 (three) times daily as needed for cough. 30 capsule 1   budesonide (PULMICORT) 0.5 MG/2ML nebulizer solution Take by nebulization.     clindamycin (CLEOCIN T) 1 % lotion Apply 1 application topically daily.     doxepin (SINEQUAN) 10 MG capsule Take 1 capsule (10 mg total) by mouth at bedtime. 30 capsule 0   escitalopram (LEXAPRO) 10 MG tablet Take 1 tablet (10 mg total) by mouth daily. 30 tablet 2   ferric citrate (AURYXIA) 1 GM 210 MG(Fe) tablet Take 210 mg by mouth 3 (three) times daily with meals. Only taking once a day most days     formoterol (PERFOROMIST) 20 MCG/2ML nebulizer solution      insulin degludec (TRESIBA) 100 UNIT/ML FlexTouch Pen Inject 20 Units into the skin daily.     insulin lispro (HUMALOG) 100 UNIT/ML KwikPen Inject into the skin.     lactulose (CHRONULAC) 10 GM/15ML solution Take 30 mLs (20 g total) by mouth daily as needed for severe constipation. 240 mL 0   lidocaine-prilocaine (EMLA) cream Apply topically as needed (For  access for dialysis).     LINZESS 290 MCG CAPS capsule TAKE 1 CAPSULE BY MOUTH IN THE MORNING WITH  A  GLASS  OF  WATER  30  MINUTES  BEFORE  BREAKFAST  (FOR  CHRONIC  IDEOPATHIC  CONSTIPATION) 30 capsule 0   midodrine (PROAMATINE) 10 MG tablet Take 20 mg by mouth daily as needed.     montelukast (SINGULAIR) 10 MG tablet Take 1 tablet (10 mg total) by mouth at bedtime. 30 tablet 3   Omeprazole Magnesium (PRILOSEC PO)      OXYGEN Inhale 2-4 L into the lungs.     predniSONE (DELTASONE) 50 MG tablet Take 1 tablet by mouth daily in AM with food x 5 days 5 tablet 0   pregabalin (LYRICA) 50 MG capsule TAKE 1 CAPSULE BY MOUTH EVERY 8 HOURS AS NEEDED FOR  NEUROPATHY 90  capsule 0   RENVELA 2.4 g PACK Take 2.4 g by mouth 3 (three) times daily.     revefenacin (YUPELRI) 175 MCG/3ML nebulizer solution      traMADol (ULTRAM) 50 MG tablet Take 2 tablets (100 mg total) by mouth 3 (three) times daily. 180 tablet 1   Vitamin D, Cholecalciferol, 25 MCG (1000 UT) TABS Take 1,000 Units by mouth daily.     No facility-administered medications prior to visit.    Review of Systems CNS: No confusion or sedation Cardiac: No angina or palpitations GI: No abdominal pain or constipation Constitutional: No nausea vomiting fevers or chills  Objective:  BP 108/65   Pulse (!) 102   Temp (!) 97.4 F (36.3 C) (Temporal)   Resp 16   Ht  (1.626 m)   Wt 264 lb (119.7 kg)   LMP 10/14/2012 (Approximate)   SpO2 97% Comment: O2 at 4 liters [facility]  BMI 45.32 kg/m    BP Readings from Last 3 Encounters:  06/10/22 108/65  05/22/22 110/80  05/20/22 108/70     Wt Readings from Last 3 Encounters:  06/10/22 264 lb (119.7 kg)  05/22/22 266 lb 1.5 oz (120.7 kg)  05/20/22 266 lb 12.1 oz (121 kg)     Physical Exam Pt is alert and oriented PERRL EOMI HEART IS RRR no murmur or rub LCTA no wheezing or rales MUSCULOSKELETAL reveals 3 trigger points as noted above.  1 to the left of midline at L5 and 3 to the right of midline at L5.  Her strength to flexion extension at the right knee and left knee is bilateral symmetric approximately 5-/5.  Muscle tone and bulk is slightly diminished.  Sensation appears to be grossly intact.  She is in a wheelchair today.  Labs  Lab Results  Component Value Date   HGBA1C 7.3 (H) 04/16/2022   HGBA1C 6.2 (H) 04/22/2021   HGBA1C 8.9 (H) 10/15/2020   Lab Results  Component Value Date   LDLCALC 169 (H) 01/03/2015   CREATININE 8.07 (H) 04/20/2022    -------------------------------------------------------------------------------------------------------------------- Lab Results  Component Value Date   WBC 8.8 04/20/2022   HGB 11.1 (L)  04/20/2022   HCT 36.8 04/20/2022   PLT 227 04/20/2022   GLUCOSE 195 (H) 04/20/2022   CHOL 232 (H) 01/03/2015   TRIG 151 (H) 04/21/2021   HDL 41 01/03/2015   LDLCALC 169 (H) 01/03/2015   ALT 15 04/17/2022   AST 18 04/17/2022   NA 130 (L) 04/20/2022   K 4.6 04/20/2022   CL 92 (L) 04/20/2022   CREATININE 8.07 (H) 04/20/2022   BUN 61 (H)  04/20/2022   CO2 21 (L) 04/20/2022   TSH 2.831 11/08/2016   INR 1.2 04/15/2022   HGBA1C 7.3 (H) 04/16/2022    --------------------------------------------------------------------------------------------------------------------- DG Chest 2 View  Result Date: 04/26/2022 CLINICAL DATA:  Dyspnea EXAM: CHEST - 2 VIEW COMPARISON:  04/15/2022 FINDINGS: Stable cardiomegaly. Pulmonary vascular congestion. Prominent interstitial markings in the perihilar and bibasilar regions, similar to prior. Chronic elevation of the right hemidiaphragm. No pleural effusion or pneumothorax. IMPRESSION: Cardiomegaly with pulmonary vascular congestion and mild interstitial edema. Appearance is similar to prior. Electronically Signed   By: Duanne Guess D.O.   On: 04/26/2022 13:04     Assessment & Plan:   [name] was seen today for back pain.  Diagnoses and all orders for this visit:  Chronic right-sided low back pain with right-sided sciatica  Chronic pain syndrome  Spinal stenosis of lumbar region without neurogenic claudication  Muscle spasm of back  Facet arthropathy, lumbar  Other orders -     dexamethasone (DECADRON) injection 10 mg -     ropivacaine (PF) 2 mg/mL (0.2%) (NAROPIN) injection 10 mL        ----------------------------------------------------------------------------------------------------------------------  Problem List Items Addressed This Visit       Unprioritized   Chronic low back pain - Primary   Relevant Medications   ropivacaine (PF) 2 mg/mL (0.2%) (NAROPIN) injection 10 mL   Chronic pain syndrome   Relevant Medications    ropivacaine (PF) 2 mg/mL (0.2%) (NAROPIN) injection 10 mL   Other Visit Diagnoses     Spinal stenosis of lumbar region without neurogenic claudication       Muscle spasm of back       Facet arthropathy, lumbar             ----------------------------------------------------------------------------------------------------------------------  1. Chronic right-sided low back pain with right-sided sciatica I think she would be a reasonable candidate for an epidural steroid injection however would like to see how she responds to trigger point injection and application of the Zynex unit for muscle spasm.  Will have her scheduled for return to clinic in 2 weeks with a repeat trigger point injection at that time.  Ultimately she may be a candidate for an epidural steroid injection to help with the spinal stenosis and sciatica symptoms she is experiencing.  We gone over the risk gone over the risks and benefits of this procedure with her in detail.  2. Chronic pain syndrome Continue current medication management.  I do think she is a poor candidate for opioid therapy secondary to chronic underlying cardiovascular, renal and and pulmonary disease  3. Spinal stenosis of lumbar region without neurogenic claudication As above  4. Muscle spasm of back Trigger point injections today.  We gone over the risk and benefits of these as well.  Continue stretching strengthening exercises as reviewed  5. Facet arthropathy, lumbar As above    ----------------------------------------------------------------------------------------------------------------------  I am having Monserrate M. Hesch maintain her acetaminophen, aspirin EC, lactulose, ferric citrate, lidocaine-prilocaine, OXYGEN, clindamycin, Yupelri, formoterol, budesonide, insulin lispro, Renvela, midodrine, montelukast, Omeprazole Magnesium (PRILOSEC PO), albuterol, albuterol, atorvastatin, insulin degludec, escitalopram, predniSONE, doxepin,  traMADol, Vitamin D (Cholecalciferol), benzonatate, pregabalin, and Linzess. We administered dexamethasone.   Meds ordered this encounter  Medications   dexamethasone (DECADRON) injection 10 mg   ropivacaine (PF) 2 mg/mL (0.2%) (NAROPIN) injection 10 mL   [name] Medications  New Prescriptions   No medications on file  Previous Medications   ACETAMINOPHEN (TYLENOL) 500 MG TABLET    Take 1,000 mg by mouth  every 6 (six) hours as needed for mild pain, fever or headache.   ALBUTEROL (PROVENTIL) (2.5 MG/3ML) 0.083% NEBULIZER SOLUTION    Take 3 mLs (2.5 mg total) by nebulization every 6 (six) hours as needed for wheezing or shortness of breath.   ALBUTEROL (VENTOLIN HFA) 108 (90 BASE) MCG/ACT INHALER    Inhale 2 puffs into the lungs every 6 (six) hours as needed for wheezing or shortness of breath.   ASPIRIN EC 81 MG TABLET    Take 81 mg by mouth daily.    ATORVASTATIN (LIPITOR) 40 MG TABLET    Take 1 tablet (40 mg total) by mouth daily.   BENZONATATE (TESSALON PERLES) 100 MG CAPSULE    Take 1 capsule (100 mg total) by mouth 3 (three) times daily as needed for cough.   BUDESONIDE (PULMICORT) 0.5 MG/2ML NEBULIZER SOLUTION    Take by nebulization.   CLINDAMYCIN (CLEOCIN T) 1 % LOTION    Apply 1 application topically daily.   DOXEPIN (SINEQUAN) 10 MG CAPSULE    Take 1 capsule (10 mg total) by mouth at bedtime.   ESCITALOPRAM (LEXAPRO) 10 MG TABLET    Take 1 tablet (10 mg total) by mouth daily.   FERRIC CITRATE (AURYXIA) 1 GM 210 MG(FE) TABLET    Take 210 mg by mouth 3 (three) times daily with meals. Only taking once a day most days   FORMOTEROL (PERFOROMIST) 20 MCG/2ML NEBULIZER SOLUTION       INSULIN DEGLUDEC (TRESIBA) 100 UNIT/ML FLEXTOUCH PEN    Inject 20 Units into the skin daily.   INSULIN LISPRO (HUMALOG) 100 UNIT/ML KWIKPEN    Inject into the skin.   LACTULOSE (CHRONULAC) 10 GM/15ML SOLUTION    Take 30 mLs (20 g total) by mouth daily as needed for severe constipation.    LIDOCAINE-PRILOCAINE (EMLA) CREAM    Apply topically as needed (For access for dialysis).   LINZESS 290 MCG CAPS CAPSULE    TAKE 1 CAPSULE BY MOUTH IN THE MORNING WITH  A  GLASS  OF  WATER  30  MINUTES  BEFORE  BREAKFAST  (FOR  CHRONIC  IDEOPATHIC  CONSTIPATION)   MIDODRINE (PROAMATINE) 10 MG TABLET    Take 20 mg by mouth daily as needed.   MONTELUKAST (SINGULAIR) 10 MG TABLET    Take 1 tablet (10 mg total) by mouth at bedtime.   OMEPRAZOLE MAGNESIUM (PRILOSEC PO)       OXYGEN    Inhale 2-4 L into the lungs.   PREDNISONE (DELTASONE) 50 MG TABLET    Take 1 tablet by mouth daily in AM with food x 5 days   PREGABALIN (LYRICA) 50 MG CAPSULE    TAKE 1 CAPSULE BY MOUTH EVERY 8 HOURS AS NEEDED FOR  NEUROPATHY   RENVELA 2.4 G PACK    Take 2.4 g by mouth 3 (three) times daily.   REVEFENACIN (YUPELRI) 175 MCG/3ML NEBULIZER SOLUTION       TRAMADOL (ULTRAM) 50 MG TABLET    Take 2 tablets (100 mg total) by mouth 3 (three) times daily.   VITAMIN D, CHOLECALCIFEROL, 25 MCG (1000 UT) TABS    Take 1,000 Units by mouth daily.  Modified Medications   No medications on file  Discontinued Medications   No medications on file   ----------------------------------------------------------------------------------------------------------------------  Follow-up: No follow-ups on file.  Trigger point injection: The area overlying the aforementioned trigger points were prepped with alcohol. They were then injected with a 25-gauge needle with 2.5 cc of ropivacaine  0.2% and Decadron2.5 mg at each site after negative aspiration for heme. This was performed after informed consent was obtained and risks and benefits reviewed. She tolerated this procedure without difficulty and was convalesced and discharged to home in stable condition for follow-up as mentioned.   Pernell Dupre, MD@  Yevette Edwards, MD

## 2022-06-10 NOTE — Patient Instructions (Addendum)
____________________________________________________________________________________________  Post-procedure Information What to expect: Most procedures involve the use of a local anesthetic (numbing medicine), and a steroid (anti-inflammatory medicine).  The local anesthetics may cause temporary numbness and weakness of the legs or arms, depending on the location of the block. This numbness/weakness may last 4-6 hours, depending on the local anesthetic used. In rare instances, it can last up to 24 hours. While numb, you must be very careful not to injure the extremity.  After any procedure, you could expect the pain to get better within 15-20 minutes. This relief is temporary and may last 4-6 hours. Once the local anesthetics wears off, you could experience discomfort, possibly more than usual, for up to 10 (ten) days. In the case of radiofrequencies, it may last up to 6 weeks. Surgeries may take up to 8 weeks for the healing process. The discomfort is due to the irritation caused by needles going through skin and muscle. To minimize the discomfort, we recommend using ice the first day, and heat from then on. The ice should be applied for 15 minutes on, and 15 minutes off. Keep repeating this cycle until bedtime. Avoid applying the ice directly to the skin, to prevent frostbite. Heat should be used daily, until the pain improves (4-10 days). Be careful not to burn yourself.  Occasionally you may experience muscle spasms or cramps. These occur as a consequence of the irritation caused by the needle sticks to the muscle and the blood that will inevitably be lost into the surrounding muscle tissue. Blood tends to be very irritating to tissues, which tend to react by going into spasm. These spasms may start the same day of your procedure, but they may also take days to develop. This late onset type of spasm or cramp is usually caused by electrolyte imbalances triggered by the steroids, at the level of the  kidney. Cramps and spasms tend to respond well to muscle relaxants, multivitamins (some are triggered by the procedure, but may have their origins in vitamin deficiencies), and "Gatorade", or any sports drinks that can replenish any electrolyte imbalances. (If you are a diabetic, ask your pharmacist to get you a sugar-free brand.) Warm showers or baths may also be helpful. Stretching exercises are highly recommended.  General Instructions:  Be alert for signs of possible infection: redness, swelling, heat, red streaks, elevated temperature, and/or fever. These typically appear 4 to 6 days after the procedure. Immediately notify your doctor if you experience unusual bleeding, difficulty breathing, or loss of bowel or bladder control. If you experience increased pain, do not increase your pain medicine intake, unless instructed by your pain physician.  Post-Procedure Care:  Be careful in moving about. Muscle spasms in the area of the injection may occur. Applying ice or heat to the area is often helpful. The incidence of spinal headaches after epidural injections ranges between 1.4% and 6%. If you develop a headache that does not seem to respond to conservative therapy, please let your physician know. This can be treated with an epidural blood patch.   Post-procedure numbness or redness is to be expected, however it should average 4 to 6 hours. If numbness and weakness of your extremities begins to develop 4 to 6 hours after your procedure, and is felt to be progressing and worsening, immediately contact your physician.  Diet:  If you experience nausea, do not eat until this sensation goes away. If you had a "Stellate Ganglion Block" for upper extremity "Reflex Sympathetic Dystrophy", do not eat or   drink until your hoarseness goes away. In any case, always start with liquids first and if you tolerate them well, then slowly progress to more solid foods.  Activity:  For the first 4 to 6 hours after the  procedure, use caution in moving about as you may experience numbness and/or weakness. Use caution in cooking, using household electrical appliances, and climbing steps. If you need to reach your Doctor call our office: [phone] (During business hours) or [phone] (After business hours).  Business Hours: Monday-Thursday 8:00 am - 4:00 PM    Fridays: Closed     In case of an emergency: In case of emergency, call 911 or go to the nearest emergency room and have the physician there call us.  Interpretation of Procedure Every nerve block has two components: a diagnostic component, and a treatment component. Unrealistic expectations are the most common causes of "perceived failure".  In a perfect world, a single nerve block should be able to completely and permanently eliminate the pain. Sadly, the world is not perfect.  Most pain management nerve blocks are performed using local anesthetics and steroids. Steroids are responsible for any long-term benefit that you may experience. Their purpose is to decrease any chronic swelling that may exist in the area. Steroids begin to work immediately after being injected. However, most patients will not experience any benefits until 5 to 10 days after the injection, when the swelling has come down to the point where they can tell a difference. Steroids will only help if there is swelling to be treated. As such, they can assist with the diagnosis. If effective, they suggest an inflammatory component to the pain, and if ineffective, they rule out inflammation as the main cause or component of the problem. If the problem is one of mechanical compression, you will get no benefit from those steroids.   In the case of local anesthetics, they have a crucial role in the diagnosis of your condition. Most will begin to work within15 to 20 minutes after injection. The duration will depend on the type used (short- vs. Long-acting). It is of outmost importance that  patients keep tract of their pain, after the procedure. To assist with this matter, a "Post-procedure Pain Diary" is provided. Make sure to complete it and to bring it back to your follow-up appointment.  As long as the patient keeps accurate, detailed records of their symptoms after every procedure, and returns to have those interpreted, every procedure will provide us with invaluable information. Even a block that does not provide the patient with any relief, will always provide us with information about the mechanism and the origin of the pain. The only time a nerve block can be considered a waste of time is when patients do not keep track of the results, or do not keep their post-procedure appointment.  Reporting the results back to your physician The Pain Score  Pain is a subjective complaint. It cannot be seen, touched, or measured. We depend entirely on the patient's report of the pain in order to assess your condition and treatment. To evaluate the pain, we use a pain scale, where "0" means "No Pain", and a "10" is "the worst possible pain that you can even imagine" (i.e. something like been eaten alive by a shark or being torn apart by a lion).   Use the Pain Scale provided. You will frequently be asked to rate your pain. Please be accurate, remember that medical decisions will be based on your   responses. Please do not rate your pain above a 10. Doing so is actually interpreted as "symptom magnification" (exaggeration). To put this into perspective, when you tell us that your pain is at a 10 (ten), what you are saying is that there is nothing we can do to make this pain any worse. (Carefully think about that.)  Bring your bottle of Tramadol when you come for a medication refill. ____________________________________________________________________________________________

## 2022-06-10 NOTE — Progress Notes (Signed)
Safety precautions to be maintained throughout the outpatient stay will include: orient to surroundings, keep bed in low position, maintain call bell within reach at all times, provide assistance with transfer out of bed and ambulation.   Zynex unit ordered.

## 2022-06-11 DIAGNOSIS — N2581 Secondary hyperparathyroidism of renal origin: Secondary | ICD-10-CM | POA: Diagnosis not present

## 2022-06-11 DIAGNOSIS — M869 Osteomyelitis, unspecified: Secondary | ICD-10-CM | POA: Diagnosis not present

## 2022-06-11 DIAGNOSIS — N186 End stage renal disease: Secondary | ICD-10-CM | POA: Diagnosis not present

## 2022-06-11 DIAGNOSIS — M62830 Muscle spasm of back: Secondary | ICD-10-CM | POA: Diagnosis not present

## 2022-06-11 DIAGNOSIS — Z992 Dependence on renal dialysis: Secondary | ICD-10-CM | POA: Diagnosis not present

## 2022-06-11 DIAGNOSIS — G894 Chronic pain syndrome: Secondary | ICD-10-CM | POA: Diagnosis not present

## 2022-06-11 DIAGNOSIS — M86171 Other acute osteomyelitis, right ankle and foot: Secondary | ICD-10-CM | POA: Diagnosis not present

## 2022-06-12 ENCOUNTER — Telehealth: Payer: Self-pay

## 2022-06-12 DIAGNOSIS — M86371 Chronic multifocal osteomyelitis, right ankle and foot: Secondary | ICD-10-CM | POA: Diagnosis not present

## 2022-06-12 DIAGNOSIS — Z9981 Dependence on supplemental oxygen: Secondary | ICD-10-CM | POA: Diagnosis not present

## 2022-06-12 DIAGNOSIS — E1169 Type 2 diabetes mellitus with other specified complication: Secondary | ICD-10-CM | POA: Diagnosis not present

## 2022-06-12 DIAGNOSIS — Z7985 Long-term (current) use of injectable non-insulin antidiabetic drugs: Secondary | ICD-10-CM | POA: Diagnosis not present

## 2022-06-12 DIAGNOSIS — I70203 Unspecified atherosclerosis of native arteries of extremities, bilateral legs: Secondary | ICD-10-CM | POA: Diagnosis not present

## 2022-06-12 DIAGNOSIS — Z992 Dependence on renal dialysis: Secondary | ICD-10-CM | POA: Diagnosis not present

## 2022-06-12 DIAGNOSIS — E11621 Type 2 diabetes mellitus with foot ulcer: Secondary | ICD-10-CM | POA: Diagnosis not present

## 2022-06-12 DIAGNOSIS — I5032 Chronic diastolic (congestive) heart failure: Secondary | ICD-10-CM | POA: Diagnosis not present

## 2022-06-12 DIAGNOSIS — J441 Chronic obstructive pulmonary disease with (acute) exacerbation: Secondary | ICD-10-CM | POA: Diagnosis not present

## 2022-06-12 DIAGNOSIS — Z794 Long term (current) use of insulin: Secondary | ICD-10-CM | POA: Diagnosis not present

## 2022-06-12 DIAGNOSIS — E872 Acidosis, unspecified: Secondary | ICD-10-CM | POA: Diagnosis not present

## 2022-06-12 DIAGNOSIS — D631 Anemia in chronic kidney disease: Secondary | ICD-10-CM | POA: Diagnosis not present

## 2022-06-12 DIAGNOSIS — L97513 Non-pressure chronic ulcer of other part of right foot with necrosis of muscle: Secondary | ICD-10-CM | POA: Diagnosis not present

## 2022-06-12 DIAGNOSIS — E1122 Type 2 diabetes mellitus with diabetic chronic kidney disease: Secondary | ICD-10-CM | POA: Diagnosis not present

## 2022-06-12 DIAGNOSIS — J9611 Chronic respiratory failure with hypoxia: Secondary | ICD-10-CM | POA: Diagnosis not present

## 2022-06-12 DIAGNOSIS — N186 End stage renal disease: Secondary | ICD-10-CM | POA: Diagnosis not present

## 2022-06-12 DIAGNOSIS — E1165 Type 2 diabetes mellitus with hyperglycemia: Secondary | ICD-10-CM | POA: Diagnosis not present

## 2022-06-12 DIAGNOSIS — I132 Hypertensive heart and chronic kidney disease with heart failure and with stage 5 chronic kidney disease, or end stage renal disease: Secondary | ICD-10-CM | POA: Diagnosis not present

## 2022-06-12 DIAGNOSIS — E1151 Type 2 diabetes mellitus with diabetic peripheral angiopathy without gangrene: Secondary | ICD-10-CM | POA: Diagnosis not present

## 2022-06-12 DIAGNOSIS — I951 Orthostatic hypotension: Secondary | ICD-10-CM | POA: Diagnosis not present

## 2022-06-12 DIAGNOSIS — J9612 Chronic respiratory failure with hypercapnia: Secondary | ICD-10-CM | POA: Diagnosis not present

## 2022-06-12 NOTE — Telephone Encounter (Signed)
[name] with [facility] called and left vm regarding pt just wanted to report O2 is 87% on 3 liters, said pt's oxygen has been going down but will try and hook up to CPAP to see if that helps. Otherwise pt's vitals are normal, please advise  Nancy's call back: [phone]

## 2022-06-13 DIAGNOSIS — N2581 Secondary hyperparathyroidism of renal origin: Secondary | ICD-10-CM | POA: Diagnosis not present

## 2022-06-13 DIAGNOSIS — Z992 Dependence on renal dialysis: Secondary | ICD-10-CM | POA: Diagnosis not present

## 2022-06-13 DIAGNOSIS — M86171 Other acute osteomyelitis, right ankle and foot: Secondary | ICD-10-CM | POA: Diagnosis not present

## 2022-06-13 DIAGNOSIS — N186 End stage renal disease: Secondary | ICD-10-CM | POA: Diagnosis not present

## 2022-06-15 DIAGNOSIS — Z992 Dependence on renal dialysis: Secondary | ICD-10-CM | POA: Diagnosis not present

## 2022-06-15 DIAGNOSIS — N2581 Secondary hyperparathyroidism of renal origin: Secondary | ICD-10-CM | POA: Diagnosis not present

## 2022-06-15 DIAGNOSIS — M86171 Other acute osteomyelitis, right ankle and foot: Secondary | ICD-10-CM | POA: Diagnosis not present

## 2022-06-15 DIAGNOSIS — N186 End stage renal disease: Secondary | ICD-10-CM | POA: Diagnosis not present

## 2022-06-17 DIAGNOSIS — E1151 Type 2 diabetes mellitus with diabetic peripheral angiopathy without gangrene: Secondary | ICD-10-CM | POA: Diagnosis not present

## 2022-06-17 DIAGNOSIS — E1165 Type 2 diabetes mellitus with hyperglycemia: Secondary | ICD-10-CM | POA: Diagnosis not present

## 2022-06-17 DIAGNOSIS — E11621 Type 2 diabetes mellitus with foot ulcer: Secondary | ICD-10-CM | POA: Diagnosis not present

## 2022-06-17 DIAGNOSIS — I951 Orthostatic hypotension: Secondary | ICD-10-CM | POA: Diagnosis not present

## 2022-06-17 DIAGNOSIS — N186 End stage renal disease: Secondary | ICD-10-CM | POA: Diagnosis not present

## 2022-06-17 DIAGNOSIS — Z992 Dependence on renal dialysis: Secondary | ICD-10-CM | POA: Diagnosis not present

## 2022-06-17 DIAGNOSIS — E1122 Type 2 diabetes mellitus with diabetic chronic kidney disease: Secondary | ICD-10-CM | POA: Diagnosis not present

## 2022-06-17 DIAGNOSIS — I5032 Chronic diastolic (congestive) heart failure: Secondary | ICD-10-CM | POA: Diagnosis not present

## 2022-06-17 DIAGNOSIS — J9612 Chronic respiratory failure with hypercapnia: Secondary | ICD-10-CM | POA: Diagnosis not present

## 2022-06-17 DIAGNOSIS — Z9981 Dependence on supplemental oxygen: Secondary | ICD-10-CM | POA: Diagnosis not present

## 2022-06-17 DIAGNOSIS — M86371 Chronic multifocal osteomyelitis, right ankle and foot: Secondary | ICD-10-CM | POA: Diagnosis not present

## 2022-06-17 DIAGNOSIS — E1169 Type 2 diabetes mellitus with other specified complication: Secondary | ICD-10-CM | POA: Diagnosis not present

## 2022-06-17 DIAGNOSIS — Z794 Long term (current) use of insulin: Secondary | ICD-10-CM | POA: Diagnosis not present

## 2022-06-17 DIAGNOSIS — J9611 Chronic respiratory failure with hypoxia: Secondary | ICD-10-CM | POA: Diagnosis not present

## 2022-06-17 DIAGNOSIS — J441 Chronic obstructive pulmonary disease with (acute) exacerbation: Secondary | ICD-10-CM | POA: Diagnosis not present

## 2022-06-17 DIAGNOSIS — L97513 Non-pressure chronic ulcer of other part of right foot with necrosis of muscle: Secondary | ICD-10-CM | POA: Diagnosis not present

## 2022-06-17 DIAGNOSIS — I70203 Unspecified atherosclerosis of native arteries of extremities, bilateral legs: Secondary | ICD-10-CM | POA: Diagnosis not present

## 2022-06-17 DIAGNOSIS — Z7985 Long-term (current) use of injectable non-insulin antidiabetic drugs: Secondary | ICD-10-CM | POA: Diagnosis not present

## 2022-06-17 DIAGNOSIS — D631 Anemia in chronic kidney disease: Secondary | ICD-10-CM | POA: Diagnosis not present

## 2022-06-17 DIAGNOSIS — E872 Acidosis, unspecified: Secondary | ICD-10-CM | POA: Diagnosis not present

## 2022-06-17 DIAGNOSIS — I132 Hypertensive heart and chronic kidney disease with heart failure and with stage 5 chronic kidney disease, or end stage renal disease: Secondary | ICD-10-CM | POA: Diagnosis not present

## 2022-06-18 DIAGNOSIS — M869 Osteomyelitis, unspecified: Secondary | ICD-10-CM | POA: Diagnosis not present

## 2022-06-18 DIAGNOSIS — Z992 Dependence on renal dialysis: Secondary | ICD-10-CM | POA: Diagnosis not present

## 2022-06-18 DIAGNOSIS — N2581 Secondary hyperparathyroidism of renal origin: Secondary | ICD-10-CM | POA: Diagnosis not present

## 2022-06-18 DIAGNOSIS — Z794 Long term (current) use of insulin: Secondary | ICD-10-CM | POA: Diagnosis not present

## 2022-06-18 DIAGNOSIS — E119 Type 2 diabetes mellitus without complications: Secondary | ICD-10-CM | POA: Diagnosis not present

## 2022-06-18 DIAGNOSIS — N186 End stage renal disease: Secondary | ICD-10-CM | POA: Diagnosis not present

## 2022-06-18 DIAGNOSIS — M86171 Other acute osteomyelitis, right ankle and foot: Secondary | ICD-10-CM | POA: Diagnosis not present

## 2022-06-19 DIAGNOSIS — Z992 Dependence on renal dialysis: Secondary | ICD-10-CM | POA: Diagnosis not present

## 2022-06-19 DIAGNOSIS — E1151 Type 2 diabetes mellitus with diabetic peripheral angiopathy without gangrene: Secondary | ICD-10-CM | POA: Diagnosis not present

## 2022-06-19 DIAGNOSIS — J441 Chronic obstructive pulmonary disease with (acute) exacerbation: Secondary | ICD-10-CM | POA: Diagnosis not present

## 2022-06-19 DIAGNOSIS — Z7985 Long-term (current) use of injectable non-insulin antidiabetic drugs: Secondary | ICD-10-CM | POA: Diagnosis not present

## 2022-06-19 DIAGNOSIS — N186 End stage renal disease: Secondary | ICD-10-CM | POA: Diagnosis not present

## 2022-06-19 DIAGNOSIS — E872 Acidosis, unspecified: Secondary | ICD-10-CM | POA: Diagnosis not present

## 2022-06-19 DIAGNOSIS — E11621 Type 2 diabetes mellitus with foot ulcer: Secondary | ICD-10-CM | POA: Diagnosis not present

## 2022-06-19 DIAGNOSIS — E1165 Type 2 diabetes mellitus with hyperglycemia: Secondary | ICD-10-CM | POA: Diagnosis not present

## 2022-06-19 DIAGNOSIS — J9612 Chronic respiratory failure with hypercapnia: Secondary | ICD-10-CM | POA: Diagnosis not present

## 2022-06-19 DIAGNOSIS — Z794 Long term (current) use of insulin: Secondary | ICD-10-CM | POA: Diagnosis not present

## 2022-06-19 DIAGNOSIS — D631 Anemia in chronic kidney disease: Secondary | ICD-10-CM | POA: Diagnosis not present

## 2022-06-19 DIAGNOSIS — E1169 Type 2 diabetes mellitus with other specified complication: Secondary | ICD-10-CM | POA: Diagnosis not present

## 2022-06-19 DIAGNOSIS — I70203 Unspecified atherosclerosis of native arteries of extremities, bilateral legs: Secondary | ICD-10-CM | POA: Diagnosis not present

## 2022-06-19 DIAGNOSIS — I5032 Chronic diastolic (congestive) heart failure: Secondary | ICD-10-CM | POA: Diagnosis not present

## 2022-06-19 DIAGNOSIS — M86371 Chronic multifocal osteomyelitis, right ankle and foot: Secondary | ICD-10-CM | POA: Diagnosis not present

## 2022-06-19 DIAGNOSIS — I951 Orthostatic hypotension: Secondary | ICD-10-CM | POA: Diagnosis not present

## 2022-06-19 DIAGNOSIS — L97513 Non-pressure chronic ulcer of other part of right foot with necrosis of muscle: Secondary | ICD-10-CM | POA: Diagnosis not present

## 2022-06-19 DIAGNOSIS — Z9981 Dependence on supplemental oxygen: Secondary | ICD-10-CM | POA: Diagnosis not present

## 2022-06-19 DIAGNOSIS — E1122 Type 2 diabetes mellitus with diabetic chronic kidney disease: Secondary | ICD-10-CM | POA: Diagnosis not present

## 2022-06-19 DIAGNOSIS — I132 Hypertensive heart and chronic kidney disease with heart failure and with stage 5 chronic kidney disease, or end stage renal disease: Secondary | ICD-10-CM | POA: Diagnosis not present

## 2022-06-19 DIAGNOSIS — J9611 Chronic respiratory failure with hypoxia: Secondary | ICD-10-CM | POA: Diagnosis not present

## 2022-06-20 DIAGNOSIS — M86171 Other acute osteomyelitis, right ankle and foot: Secondary | ICD-10-CM | POA: Diagnosis not present

## 2022-06-20 DIAGNOSIS — N2581 Secondary hyperparathyroidism of renal origin: Secondary | ICD-10-CM | POA: Diagnosis not present

## 2022-06-20 DIAGNOSIS — Z992 Dependence on renal dialysis: Secondary | ICD-10-CM | POA: Diagnosis not present

## 2022-06-20 DIAGNOSIS — N186 End stage renal disease: Secondary | ICD-10-CM | POA: Diagnosis not present

## 2022-06-21 ENCOUNTER — Encounter: Payer: 59 | Admitting: Physician Assistant

## 2022-06-21 DIAGNOSIS — M1990 Unspecified osteoarthritis, unspecified site: Secondary | ICD-10-CM | POA: Diagnosis not present

## 2022-06-21 DIAGNOSIS — L97514 Non-pressure chronic ulcer of other part of right foot with necrosis of bone: Secondary | ICD-10-CM | POA: Diagnosis not present

## 2022-06-21 DIAGNOSIS — Z992 Dependence on renal dialysis: Secondary | ICD-10-CM | POA: Diagnosis not present

## 2022-06-21 DIAGNOSIS — I132 Hypertensive heart and chronic kidney disease with heart failure and with stage 5 chronic kidney disease, or end stage renal disease: Secondary | ICD-10-CM | POA: Diagnosis not present

## 2022-06-21 DIAGNOSIS — N186 End stage renal disease: Secondary | ICD-10-CM | POA: Diagnosis not present

## 2022-06-21 DIAGNOSIS — E1140 Type 2 diabetes mellitus with diabetic neuropathy, unspecified: Secondary | ICD-10-CM | POA: Diagnosis not present

## 2022-06-21 DIAGNOSIS — E11621 Type 2 diabetes mellitus with foot ulcer: Secondary | ICD-10-CM | POA: Diagnosis not present

## 2022-06-21 DIAGNOSIS — J449 Chronic obstructive pulmonary disease, unspecified: Secondary | ICD-10-CM | POA: Diagnosis not present

## 2022-06-21 DIAGNOSIS — I5042 Chronic combined systolic (congestive) and diastolic (congestive) heart failure: Secondary | ICD-10-CM | POA: Diagnosis not present

## 2022-06-21 DIAGNOSIS — M86371 Chronic multifocal osteomyelitis, right ankle and foot: Secondary | ICD-10-CM | POA: Diagnosis not present

## 2022-06-21 DIAGNOSIS — Z9981 Dependence on supplemental oxygen: Secondary | ICD-10-CM | POA: Diagnosis not present

## 2022-06-21 DIAGNOSIS — E1122 Type 2 diabetes mellitus with diabetic chronic kidney disease: Secondary | ICD-10-CM | POA: Diagnosis not present

## 2022-06-22 DIAGNOSIS — N2581 Secondary hyperparathyroidism of renal origin: Secondary | ICD-10-CM | POA: Diagnosis not present

## 2022-06-22 DIAGNOSIS — M86171 Other acute osteomyelitis, right ankle and foot: Secondary | ICD-10-CM | POA: Diagnosis not present

## 2022-06-22 DIAGNOSIS — N186 End stage renal disease: Secondary | ICD-10-CM | POA: Diagnosis not present

## 2022-06-22 DIAGNOSIS — Z992 Dependence on renal dialysis: Secondary | ICD-10-CM | POA: Diagnosis not present

## 2022-06-22 NOTE — Progress Notes (Signed)
Every, Dannielle Burn ([id number]) [id number] Page 1 of 7 Visit Report for 06/21/2022 Arrival Information Details Patient Name: Date of Service: [name], [name] 06/21/2022 12:00 PM Medical Record Number: [id number] Patient Account Number: [id number] Date of Birth/Sex: Treating RN: [birth year] (51 y.o. Skip Mayer Primary Care Aaira Oestreicher: Orson Eva Other Clinician: Betha Loa Referring Karin Griffith: Treating Brelyn Woehl/Extender: Samara Snide Weeks in Treatment: 21 Visit Information History Since Last Visit All ordered tests and consults were completed: No Patient Arrived: Wheel Chair Added or deleted any medications: No Arrival Time: 11:56 Any new allergies or adverse reactions: No Transfer Assistance: EasyPivot Patient Lift Had a fall or experienced change in No Patient Identification Verified: Yes activities of daily living that may affect Secondary Verification Process Completed: Yes risk of falls: Patient Requires Transmission-Based Precautions: No Signs or symptoms of abuse/neglect since last visito No Patient Has Alerts: No Hospitalized since last visit: No Implantable device outside of the clinic excluding No cellular tissue based products placed in the center since last visit: Has Dressing in Place as Prescribed: Yes Pain Present Now: Yes Electronic Signature(s) Signed: 06/21/2022 1:22:31 PM By: Betha Loa Entered By: Betha Loa on 06/21/2022 11:59:40 -------------------------------------------------------------------------------- Clinic Level of Care Assessment Details Patient Name: Date of Service: [place], [name] 06/21/2022 12:00 PM Medical Record Number: [id number] Patient Account Number: [id number] Date of Birth/Sex: Treating RN: [birth year] (51 y.o. Skip Mayer Primary Care Annai Heick: Orson Eva Other Clinician: Betha Loa Referring Kinga Cassar: Treating Cowen Pesqueira/Extender: Samara Snide Weeks  in Treatment: 85 Clinic Level of Care Assessment Items TOOL 1 Quantity Score []  - 0 Use when EandM and Procedure is performed on INITIAL visit ASSESSMENTS - Nursing Assessment / Reassessment []  - 0 General Physical Exam (combine w/ comprehensive assessment (listed just below) when performed on new pt. evals) []  - 0 Comprehensive Assessment (HX, ROS, Risk Assessments, Wounds Hx, etc.) ASSESSMENTS - Wound and Skin Assessment / Reassessment []  - 0 Dermatologic / Skin Assessment (not related to wound area) ASSESSMENTS - Ostomy and/or Continence Assessment and Care []  - 0 Incontinence Assessment and Management []  - 0 Ostomy Care Assessment and Management (repouching, etc.) PROCESS - Coordination of Care []  - 0 Simple Patient / Family Education for ongoing care []  - 0 Complex (extensive) Patient / Family Education for ongoing care []  - 0 Staff obtains Chiropractor, Records, T Results / Process Orders est []  - 0 Staff telephones HHA, Nursing Homes / Clarify orders / etc []  - 0 Routine Transfer to another Facility (non-emergent condition) []  - 0 Routine Hospital Admission (non-emergent condition) Goetze, Dannielle Burn ([id number]) [id number] Page 2 of 7 []  - 0 New Admissions / Manufacturing engineer / Ordering NPWT Apligraf, etc. , []  - 0 Emergency Hospital Admission (emergent condition) PROCESS - Special Needs []  - 0 Pediatric / Minor Patient Management []  - 0 Isolation Patient Management []  - 0 Hearing / Language / Visual special needs []  - 0 Assessment of Community assistance (transportation, D/C planning, etc.) []  - 0 Additional assistance / Altered mentation []  - 0 Support Surface(s) Assessment (bed, cushion, seat, etc.) INTERVENTIONS - Miscellaneous []  - 0 External ear exam []  - 0 Patient Transfer (multiple staff / Nurse, adult / Similar devices) []  - 0 Simple Staple / Suture removal (25 or less) []  - 0 Complex Staple / Suture removal (26 or  more) []  - 0 Hypo/Hyperglycemic Management (do not check if billed separately) []  - 0 Ankle / Brachial Index (ABI) - do not check if billed separately Has  the patient been seen at the hospital within the last three years: Yes Total Score: 0 Level Of Care: ____ Electronic Signature(s) Signed: 06/21/2022 1:22:31 PM By: Betha Loa Entered By: Betha Loa on 06/21/2022 12:25:23 -------------------------------------------------------------------------------- Encounter Discharge Information Details Patient Name: Date of Service: [place], [name] 06/21/2022 12:00 PM Medical Record Number: [id number] Patient Account Number: [id number] Date of Birth/Sex: Treating RN: Jan 28, 1971 (51 y.o. Skip Mayer Primary Care Solmon Bohr: Orson Eva Other Clinician: Betha Loa Referring Ossie Beltran: Treating Madgie Dhaliwal/Extender: Samara Snide Weeks in Treatment: 85 Encounter Discharge Information Items Post Procedure Vitals Discharge Condition: Stable Temperature (F): 99.0 Ambulatory Status: Wheelchair Pulse (bpm): 108 Discharge Destination: Home Respiratory Rate (breaths/min): 18 Transportation: Private Auto Blood Pressure (mmHg): 101/70 Accompanied By: husband Schedule Follow-up Appointment: Yes Clinical Summary of Care: Electronic Signature(s) Signed: 06/21/2022 1:22:31 PM By: Betha Loa Entered By: Betha Loa on 06/21/2022 12:42:07 -------------------------------------------------------------------------------- Lower Extremity Assessment Details Patient Name: Date of Service: [name], [name] 06/21/2022 12:00 PM Medical Record Number: [id number] Patient Account Number: [id number] Date of Birth/Sex: Treating RN: Mar 17, 1970 (51 y.o. Skip Mayer Primary Care Halo Laski: Orson Eva Other Clinician: Betha Loa Referring Jedediah Noda: Treating Sholom Dulude/Extender: Samara Snide Weeks in Treatment: 27 Vascular Assessment Dubs, Dannielle Burn  ([id number]) [Right:[id number] Page 3 of 7] Pulses: Dorsalis Pedis Palpable: [Right:Yes] Electronic Signature(s) Signed: 06/21/2022 1:21:35 PM By: Elliot Gurney, BSN, RN, CWS, Kim RN, BSN Signed: 06/21/2022 1:22:31 PM By: Betha Loa Entered By: Betha Loa on 06/21/2022 12:13:03 -------------------------------------------------------------------------------- Multi Wound Chart Details Patient Name: Date of Service: [place], [name] 06/21/2022 12:00 PM Medical Record Number: [id number] Patient Account Number: [id number] Date of Birth/Sex: Treating RN: 1970-03-20 (51 y.o. Skip Mayer Primary Care Shahad Mazurek: Orson Eva Other Clinician: Betha Loa Referring Cherokee Clowers: Treating Phoebe Marter/Extender: Samara Snide Weeks in Treatment: 90 Vital Signs Height(in): Pulse(bpm): 108 Weight(lbs): Blood Pressure(mmHg): 101/70 Body Mass Index(BMI): Temperature(F): 99.0 Respiratory Rate(breaths/min): 18 [2:Photos:] [N/A:N/A] Right, Circumferential Foot N/A N/A Wound Location: Gradually Appeared N/A N/A Wounding Event: Diabetic Wound/Ulcer of the Lower N/A N/A Primary Etiology: Extremity Cataracts, Anemia, Asthma, Chronic N/A N/A Comorbid History: Obstructive Pulmonary Disease (COPD), Congestive Heart Failure, Hypertension, Type II Diabetes, End Stage Renal Disease, Osteoarthritis, Neuropathy 10/15/2020 N/A N/A Date Acquired: 11 N/A N/A Weeks of Treatment: Open N/A N/A Wound Status: No N/A N/A Wound Recurrence: Yes N/A N/A Pending A mputation on Presentation: 11.6x9.6x0.2 N/A N/A Measurements L x W x D (cm) 87.462 N/A N/A A (cm) : rea 17.492 N/A N/A Volume (cm) : 17.50% N/A N/A % Reduction in A rea: 91.80% N/A N/A % Reduction in Volume: Grade 4 N/A N/A Classification: Medium N/A N/A Exudate A mount: Serosanguineous N/A N/A Exudate Type: red, brown N/A N/A Exudate Color: Medium (34-66%) N/A N/A Granulation A  mount: Medium (34-66%) N/A N/A Necrotic A mount: Eschar, Necrosis of Bone N/A N/A Necrotic Tissue: Fat Layer (Subcutaneous Tissue): Yes N/A N/A Exposed Structures: Sorg, Dannielle Burn ([id number]) [id number] Page 4 of 7 Fascia: No Tendon: No Muscle: No Joint: No Bone: No Treatment Notes Electronic Signature(s) Signed: 06/21/2022 1:22:31 PM By: Betha Loa Entered By: Betha Loa on 06/21/2022 12:13:11 -------------------------------------------------------------------------------- Multi-Disciplinary Care Plan Details Patient Name: Date of Service: [place], [name] 06/21/2022 12:00 PM Medical Record Number: [id number] Patient Account Number: [id number] Date of Birth/Sex: Treating RN: Jul 03, 1970 (51 y.o. Skip Mayer Primary Care Layann Bluett: Orson Eva Other Clinician: Betha Loa Referring Vy Badley: Treating Therin Vetsch/Extender: Samara Snide Weeks in Treatment: 32 Active Inactive Wound/Skin Impairment Nursing Diagnoses:  Impaired tissue integrity Knowledge deficit related to ulceration/compromised skin integrity Goals: Ulcer/skin breakdown will have a volume reduction of 30% by week 4 Date Initiated: 05/18/2021 Date Inactivated: 04/15/2022 Target Resolution Date: 06/15/2021 Goal Status: Unmet Unmet Reason: comorbidities Ulcer/skin breakdown will have a volume reduction of 50% by week 8 Date Initiated: 05/18/2021 Target Resolution Date: 07/13/2021 Goal Status: Active Ulcer/skin breakdown will have a volume reduction of 80% by week 12 Date Initiated: 05/18/2021 Target Resolution Date: 08/10/2021 Goal Status: Active Ulcer/skin breakdown will heal within 14 weeks Date Initiated: 05/18/2021 Target Resolution Date: 08/24/2021 Goal Status: Active Interventions: Assess patient/caregiver ability to obtain necessary supplies Assess patient/caregiver ability to perform ulcer/skin care regimen upon admission and as needed Assess  ulceration(s) every visit Provide education on ulcer and skin care Notes: Electronic Signature(s) Signed: 06/21/2022 1:21:35 PM By: Elliot Gurney, BSN, RN, CWS, Kim RN, BSN Signed: 06/21/2022 1:22:31 PM By: Betha Loa Entered By: Betha Loa on 06/21/2022 12:40:59 -------------------------------------------------------------------------------- Pain Assessment Details Patient Name: Date of Service: [name] 06/21/2022 12:00 PM Medical Record Number: [id number] Patient Account Number: [id number] Date of Birth/Sex: Treating RN: 23-Sep-1970 (51 y.o. Skip Mayer Primary Care Eleni Frank: Orson Eva Other Clinician: Betha Loa Referring Yasmine Kilbourne: Treating Diannia Hogenson/Extender: Samara Snide Weeks in Treatment: [address] Active Problems Vannote, Dannielle Burn ([id number]) [id number] Page 5 of 7 Location of Pain Severity and Description of Pain Patient Has Paino Yes Site Locations Pain Location: Pain in Ulcers Duration of the Pain. Constant / Intermittento Intermittent Rate the pain. Current Pain Level: 2 Character of Pain Describe the Pain: Aching Pain Management and Medication Current Pain Management: Medication: No Cold Application: No Rest: No Massage: No Activity: No T.E.N.S.: No Heat Application: No Leg drop or elevation: No Is the Current Pain Management Adequate: Inadequate How does your wound impact your activities of daily livingo Sleep: No Bathing: No Appetite: No Relationship With Others: No Bladder Continence: No Emotions: No Bowel Continence: No Work: No Toileting: No Drive: No Dressing: No Hobbies: No Psychologist, prison and probation services) Signed: 06/21/2022 1:21:35 PM By: Elliot Gurney, BSN, RN, CWS, Kim RN, BSN Signed: 06/21/2022 1:22:31 PM By: Betha Loa Entered By: Betha Loa on 06/21/2022 12:02:54 -------------------------------------------------------------------------------- Patient/Caregiver Education Details Patient Name:  Date of Service: [name] 4/26/2024andnbsp12:00 PM Medical Record Number: [id number] Patient Account Number: [id number] Date of Birth/Gender: Treating RN: [birth year] (51 y.o. Skip Mayer Primary Care Physician: Orson Eva Other Clinician: Betha Loa Referring Physician: Treating Physician/Extender: Samara Snide Weeks in Treatment: 24 Education Assessment Education Provided To: Patient Education Topics Provided Wound/Skin Impairment: Handouts: Other: continue wound care as directed Methods: Explain/Verbal Responses: State content correctly Electronic Signature(s) Loken, Dannielle Burn ([id number]) [id number] Page 6 of 7 Signed: 06/21/2022 1:22:31 PM By: Betha Loa Entered By: Betha Loa on 06/21/2022 12:41:19 -------------------------------------------------------------------------------- Wound Assessment Details Patient Name: Date of Service: [name], [name] 06/21/2022 12:00 PM Medical Record Number: [id number] Patient Account Number: [id number] Date of Birth/Sex: Treating RN: Oct 14, 1970 (51 y.o. Skip Mayer Primary Care Leland Staszewski: Orson Eva Other Clinician: Betha Loa Referring Beniah Magnan: Treating Twain Stenseth/Extender: Samara Snide Weeks in Treatment: 21 Wound Status Wound Number: 2 Primary Diabetic Wound/Ulcer of the Lower Extremity Etiology: Wound Location: Right, Circumferential Foot Wound Open Wounding Event: Gradually Appeared Status: Date Acquired: 10/15/2020 Notes: open areas to midline right foot, lateral right foot, and top of right Weeks Of Treatment: 57 foot Clustered Wound: No Comorbid Cataracts, Anemia, Asthma, Chronic Obstructive Pulmonary Pending Amputation On Presentation History: Disease (COPD), Congestive Heart Failure, Hypertension,  Type II Diabetes, End Stage Renal Disease, Osteoarthritis, Neuropathy Photos Wound Measurements Length: (cm) 11.6 Width: (cm)  9.6 Depth: (cm) 0.2 Area: (cm) 87.462 Volume: (cm) 17.492 % Reduction in Area: 17.5% % Reduction in Volume: 91.8% Wound Description Classification: Grade 4 Exudate Amount: Medium Exudate Type: Serosanguineous Exudate Color: red, brown Foul Odor After Cleansing: No Slough/Fibrino Yes Wound Bed Granulation Amount: Medium (34-66%) Exposed Structure Necrotic Amount: Medium (34-66%) Fascia Exposed: No Necrotic Quality: Eschar, Bone Fat Layer (Subcutaneous Tissue) Exposed: Yes Tendon Exposed: No Muscle Exposed: No Joint Exposed: No Bone Exposed: No Treatment Notes Wound #2 (Foot) Wound Laterality: Right, Circumferential Cleanser Normal Saline Discharge Instruction: Wash your hands with soap and water. Remove old dressing, discard into plastic bag and place into trash. Cleanse the wound with Normal Saline prior to applying a clean dressing using gauze sponges, not tissues or cotton balls. Do not scrub or use excessive force. Pat dry using gauze sponges, not tissue or cotton balls. Soap and Water Discharge Instruction: Gently cleanse wound with antibacterial soap, rinse and pat dry prior to dressing wounds Stepka, Dannielle Burn ([id number]) [id number] Page 7 of 7 Peri-Wound Care Topical Gentamicin Discharge Instruction: Apply to open area of foot Primary Dressing Gauze Discharge Instruction: soak with Betadine and wrap around toes Silvercel 4 1/4x 4 1/4 (in/in) Discharge Instruction: Apply Silvercel 4 1/4x 4 1/4 (in/in) as instructed Secondary Dressing ABD Pad 5x9 (in/in) Discharge Instruction: Cover with ABD pad Secured With Medipore T - 47M Medipore H Soft Cloth Surgical T ape ape, 2x2 (in/yd) Kerlix Roll Sterile or Non-Sterile 6-ply 4.5x4 (yd/yd) Discharge Instruction: Apply Kerlix as directed Stretch Net Dressing, Latex-free, Size 5, Small-Head / Shoulder / Thigh Compression Wrap Compression Stockings Add-Ons Electronic Signature(s) Signed:  06/21/2022 1:21:35 PM By: Elliot Gurney, BSN, RN, CWS, Kim RN, BSN Signed: 06/21/2022 1:22:31 PM By: Betha Loa Entered By: Betha Loa on 06/21/2022 12:12:18 -------------------------------------------------------------------------------- Vitals Details Patient Name: Date of Service: [name] 06/21/2022 12:00 PM Medical Record Number: [id number] Patient Account Number: [id number] Date of Birth/Sex: Treating RN: 10/28/1970 (51 y.o. Skip Mayer Primary Care Marjoria Mancillas: Orson Eva Other Clinician: Betha Loa Referring Deem Marmol: Treating Suhey Radford/Extender: Samara Snide Weeks in Treatment: 1 Vital Signs Time Taken: 12:00 Temperature (F): 99.0 Pulse (bpm): 108 Respiratory Rate (breaths/min): 18 Blood Pressure (mmHg): 101/70 Reference Range: 80 - 120 mg / dl Electronic Signature(s) Signed: 06/21/2022 1:22:31 PM By: Betha Loa Entered By: Betha Loa on 06/21/2022 12:02:48

## 2022-06-22 NOTE — Progress Notes (Addendum)
Martorano, [name] ([id number]) [id number] Page 1 of 9 Visit Report for 06/21/2022 Chief Complaint Document Details Patient Name: Date of Service: [place], [name] 06/21/2022 12:00 PM Medical Record Number: [id number] Patient Account Number: [id number] Date of Birth/Sex: Treating RN: 03-11-1970 (52 y.o. [name] Primary Care Provider: Orson Eva Other Clinician: Betha Loa Referring Provider: Treating Provider/Extender: Samara Snide Weeks in Treatment: 52 Information Obtained from: Patient Chief Complaint Right foot gangrene and osteomyelitis Electronic Signature(s) Signed: 06/21/2022 12:02:25 PM By: Allen Derry PA-C Entered By: Allen Derry on 06/21/2022 12:02:25 -------------------------------------------------------------------------------- Debridement Details Patient Name: Date of Service: [place], [name] 06/21/2022 12:00 PM Medical Record Number: [id number] Patient Account Number: [id number] Date of Birth/Sex: Treating RN: 1970-09-22 (52 y.o. [name] Primary Care Provider: Orson Eva Other Clinician: Betha Loa Referring Provider: Treating Provider/Extender: Samara Snide Weeks in Treatment: 43 Debridement Performed for Assessment: Wound #2 Right,Circumferential Foot Performed By: Physician Allen Derry, PA-C Debridement Type: Debridement Severity of Tissue Pre Debridement: Necrosis of bone Level of Consciousness (Pre-procedure): Awake and Alert Pre-procedure Verification/Time Out Yes - 12:18 Taken: Start Time: 12:18 Percent of Wound Bed Debrided: 75% T Area Debrided (cm): otal 65.56 Tissue and other material debrided: Viable, Non-Viable, Slough, Subcutaneous, Skin: Dermis , Skin: Epidermis, Slough Level: Skin/Subcutaneous Tissue Debridement Description: Excisional Instrument: Curette Bleeding: Minimum Hemostasis Achieved: Pressure Response to Treatment: Procedure was tolerated  well Level of Consciousness (Post- Awake and Alert procedure): Post Debridement Measurements of Total Wound Length: (cm) 11.6 Width: (cm) 9.6 Depth: (cm) 0.2 Volume: (cm) 17.492 Character of Wound/Ulcer Post Debridement: Stable Severity of Tissue Post Debridement: Necrosis of bone Post Procedure Diagnosis Same as Pre-procedure Electronic Signature(s) Signed: 06/21/2022 12:26:19 PM By: Allen Derry PA-C Signed: 06/21/2022 1:21:35 PM By: Elliot Gurney, BSN, RN, CWS, Kim RN, BSN Entered By: Allen Derry on 06/21/2022 12:26:19 Wachob, [name] ([id number]) [id number] Page 2 of 9 -------------------------------------------------------------------------------- HPI Details Patient Name: Date of Service: [name] 06/21/2022 12:00 PM Medical Record Number: [id number] Patient Account Number: [id number] Date of Birth/Sex: Treating RN: December 30, 1970 (52 y.o. [name] Primary Care Provider: Orson Eva Other Clinician: Betha Loa Referring Provider: Treating Provider/Extender: Samara Snide Weeks in Treatment: 43 History of Present Illness HPI Description: 10/04/2020 upon evaluation today patient presents for initial inspection here in the clinic concerning a fairly concerning wound over her right fifth toe towards the medial aspect which actually probes all the way down to bone. There is evidence of necrotic muscle as well and I do see tendon exposed as well. Subsequently this does have me very concerned about where things stand here. Again this is the first time that I am seeing her and on top of everything else her ABIs are noncompressible. I think that arterial flow is the primary concern here. Of note the patient states this was first noted July 1. She had a x-ray on July 4 which was negative for bone infection. She has been on 2 antibiotics she is unsure of the name. With that being said her most recent hemoglobin A1c was 8.1 that was  December 2022. She otherwise does have a history obviously of diabetes mellitus type 2, hypertension, end-stage renal disease with dependence on renal dialysis, and she is also dependent on supplemental oxygen. Readmission: 05/14/2021 this is a patient whom I previously seen October 04, 2020. With that being said she is subsequently on 21 October 2020 ended up having an amputation of her fifth digit of her foot unfortunately.  This had progressed fairly quickly into what sounds to been gangrene at that time. Subsequently right now based on what I am seeing the patient actually has a necrotic area on the end of her foot and has been recommended that the only option really here is going to be a amputation which would be a below-knee amputation. She is really not interested in that and wants to try to give this every chance it can to heal. For that reason I discussed with her that definitely we can see what we can do to try to improve this situation and try to help get this doing better as best we can. With that being said there is definitely a portion of her wound that is just not good to be able to heal no matter what it is already starting to auto amputate. This includes potentially digits of her foot as well based on what I am seeing. She does have confirmed osteomyelitis noted by MRI. She is also currently on IV vancomycin. She does undergo dialysis on Tuesday, Thursday, and Saturday. The patient does have a history of diabetes mellitus type 2, congestive heart failure, hypertension, end-stage renal disease, dependence on renal dialysis, and dependence on supplemental oxygen. With that being said I am going to need to look into her heart failure and the significance of this as far as hyperbarics is concerned before we initiate treatment. She is probably also going to need an updated EKG and chest x-ray unless its already been done recently. 05/28/2021 upon evaluation today patient appears to be doing well  with regard to her wound all things considered. I am going to try to see what we can do about trimmed away some of the necrotic tissue that is really just hanging on here and there and not really helpful. Feel like if we can slowly start doing this maybe we will be able to get to the point where this is better and better as far as cleaning up the surface of the wound is concerned. Still as I explained I do believe some of her toes are not salvageable but again she is looking more towards autoamputation versus want to do anything definitive here from a surgical standpoint. I have been researching whether she would be an okay candidate for hyperbarics or not and I found that her x-ray but she had a chest x-ray recently was negative for any acute disease and appear to be stable. EKG was also stable. She did also have a cardiac echo which showed that her ejection fraction was 60 to 65% and therefore I think she is a candidate for hyperbarics. Again I do believe that this can help with preventing amputation which right now she has been told that her only option would be a below-knee amputation. Nonetheless I think that if we can get the heel leading to spread and the majority of this cleared away that it is possible we might even be able to get a surgeon just to surgically remove the nonviable toes and not have to go the full way of complete amputation. Nonetheless we do have a ways to go before we will get to that point. Either way I think the hyperbarics could be an excellent support as far as that is concerned. 06-05-2021 upon evaluation today patient's wound is showing some signs of improvement. This does seem to be slow and of course it is understandable considering what we are seeing. She has discussed and looked up information about hyperbarics and in  the end comes back today and states that she does want to proceed. I honestly do feel like that is her best chance at saving the foot. Even if we are  able to get her into a surgeon after we obtain some good healing approximately so that there is not as much necrotic tissue in general she may even be able to have a transmetatarsal amputation and get this to heal. Again I am not saying this is can be an easy road but it may be her best road as far as thinking about how to achieve some type of complete healing as I do not believe the toes are viable at all to be honest. 07-06-2021 upon evaluation today patient appears to be doing well currently in regard to her foot all things considered. Again she unfortunately was unable to do the hyperbaric oxygen therapy this was due to her claustrophobia. Nonetheless she tells me that she is pleased with how the foot appears and she does feel like there is good improvement going on here. In general I think that we are on the right track. If she is going to take a very long time and to be honest the route of autoamputation which is pretty much the way this is going is not a quick or easy road but nonetheless she seems to be taking care of her foot quite well. She is in good spirits. 07-27-2021 upon evaluation today patient appears to be doing well currently in regard to her foot all things considered. A lot of the eschar is starting to lift up around the edges which is good there is still significant necrotic tissue on the distal portion of the foot. This is slowly going to work its way off. Based on what I am seeing currently I do not believe that there is any evidence of infection which is good news. 08-31-2021 upon evaluation today patient's wound actually appears to be doing decently well. I do not see anything in particular to try to trim away today. With that being said she continues to slowly allow this to do what is going to do as far as autoamputation is concerned. I did have another conversation with her today about the possibility of seeing a surgeon for a transmetatarsal amputation which again would help to  get this to close faster and obviously I think would be a much speedy year and safer way to proceed with amputation. With that being said she tells me that she would really prefer to just let this go at "God pace". 09-28-2021 upon evaluation today patient's foot actually appears to be doing much better. Fortunately I do not see any signs of infection which is great news and overall I am extremely pleased with where we stand today. There does not appear to be any signs of active infection locally or systemically which is great news. No fevers, chills, nausea, vomiting, or diarrhea. 10-26-2021 upon evaluation today patient appears to be doing well currently in regard to her foot all things considered. Fortunately I do not see any evidence of active infection locally or systemically at this time which is great news. No fevers, chills, nausea, vomiting, or diarrhea. She does have some of the eschar that is lifting up even a little bit more compared to last time I saw her. 11-30-2021 upon evaluation today patient's wound is still continuing to have some areas that lift up although there is nothing really that I feel like I could trim away too much today  to be honest. Fortunately I do not see any evidence of systemic infection though locally she has been concerned about some drainage I do not see anything that seems to be actively red or erythematous or warm to touch which is good news. No fevers, chills, nausea, vomiting, or diarrhea. 12-21-2021 upon evaluation patient's foot ulcer actually is showing signs of some improvement with healthier tissue noted at areas with eschar that keeps clearing off. There is a bit of the eschar that is loosening up today as well and I think we will get a be able to get some of this cleared away that we have been working on for a while. Fortunately I do not see any evidence of infection locally or systemically at this time which is great news. No fevers, chills,  nausea, vomiting, or diarrhea. Engel, [name] ([id number]) [id number] Page 3 of 9 01-11-2022 upon evaluation today patient appears to be doing decently well in regard to her foot she still has a necrotic area on the tip of her foot but she is tolerating the dressing changes without complication. Fortunately there does not appear to be any signs of infection locally or systemically at this time which is great news. No fevers, chills, nausea, vomiting, or diarrhea. 02-01-2022 upon evaluation today patient continues to show signs of improvement in regard to the area where we have been able to remove the eschar. Unfortunately the tip of the foot I think is the part that is completely dead and not going to be coming back as far as any healing is concerned. I have discussed this with the patient multiple times however she does not want to have any type of surgical intervention. Obviously in that regard we will try to do what we can as best we can. 02-15-2022 upon evaluation today patient appears to be doing well with regard to her foot. She is actually showing some signs of improvement she has had some drainage but nothing appears to be doing too poorly overall. I do not see any signs of infection and I am pleased in that regard. Fortunately I do not see any evidence of infection locally nor systemically which is great news. 03-01-2022 upon evaluation today patient appears to be doing well currently in regard to her wounds. She has been tolerating the dressing changes without complication. Fortunately I do not see any signs of active infection locally nor systemically which is great news. No fevers, chills, nausea, vomiting, or diarrhea. 03-15-2022 upon evaluation today patient's wound continues to show signs of loosening the splint some of the eschar is concerned on the tip of her foot. Fortunately I do not see any evidence of infection systemically which is good news locally I  feel like she is making good progress here. Overall I am extremely happy with where things stand. Especially considering the fact that really she does need to have an amputation of the necrotic portion of the end of her foot. Again if we got rid of those toes I do think that this could actually heal much more effectively and quickly with that being said she is completely against going down that road so we will try to do what we can as conservatively as we can while still making some progress here. 04-01-2022 upon evaluation today patient's foot unfortunately does not appear to be doing as well as I would like to see. I discussed with her today that I do believe she probably needs to be very mindful of this and how  things are progressing. I explained as well that this is what I been most worried about the whole time that I been seeing her since really at least March of last year when I started seeing her again. Subsequently she notes that she has had more drainage also feel that the drainage is more discolored which is not good. 04-08-2022 upon evaluation today patient appears to be doing well currently in regard to her foot ulcer this is much better than last week's evaluation. Fortunately I do not see any signs of active infection systemically though locally we still see some drainage I think this is much better with the doxycycline she has been taking that since we got the results back of the culture nothing has really changed in that regard. Fortunately I think that we are on the right track. 04-15-2022 upon evaluation today patient unfortunately appears to be doing a bit worse compared to her normal. She tells me that she has been sick with what she presumed might have been a GI bug over the weekend but to be honest I am not so certain that she could not be showing early signs of sepsis. Her foot is starting to become a little bit more purulent as far as the drainage is concerned of hide on doxycycline  for a couple weeks already which seemed to help initially to a degree but unfortunately I am just not sure this is maintaining. It started to get a little bit more weight around the edges and is starting to especially around the lateral toe show signs of progression at this point. I am coupled with the fact that her blood pressure is low today compared to her normal concerned that we should probably have her go to the ER for further evaluation and treatment in order to evaluate for the possibility of early sepsis. This is something we have had a close eye on for quite some time and it is something that I think needs to be fully evaluated at this point. 05-17-2022 upon evaluation today patient's wound is continuing to maintain stability for the most part. I do not see any signs of infection at this point which is good news I am pleased with that. The tip of her foot where she has gangrene also has remained stable and dry which is also good news. Overall I am pleased with what we are seeing today. 06-07-2022 upon evaluation today patient appears to be doing well currently in regard to her wound this is actually showing signs of improvement except for the distal portion where she has the necrotic tissue which again is not going to change. Fortunately I do not see any signs of active infection locally nor systemically which is great news. No fevers, chills, nausea, vomiting, or diarrhea. 06-21-2022 upon evaluation today patient appears to be doing well currently in regard to her wounds all things considered. She still has significant issues here with her toes. Again she is previously been attempted to undergo hyperbaric oxygen therapy but did not have the ability to tolerate this. Fortunately I do not see any signs of active infection locally nor systemically at this time. Electronic Signature(s) Signed: 06/21/2022 12:25:10 PM By: Allen Derry PA-C Entered By: Allen Derry on 06/21/2022  12:25:10 -------------------------------------------------------------------------------- Physical Exam Details Patient Name: Date of Service: [name] 06/21/2022 12:00 PM Medical Record Number: [id number] Patient Account Number: [id number] Date of Birth/Sex: Treating RN: 13-Dec-1970 (51 y.o. [name] Primary Care Provider: Orson Eva Other Clinician: Glynda Jaeger,  Angie Referring Provider: Treating Provider/Extender: Samara Snide Weeks in Treatment: 78 Constitutional Well-nourished and well-hydrated in no acute distress. Respiratory normal breathing without difficulty. Psychiatric this patient is able to make decisions and demonstrates good insight into disease process. Alert and Oriented x 3. pleasant and cooperative. Notes Upon inspection patient's wound bed actually showed signs of good granulation epithelization at this point. Fortunately I do not see any evidence of infection which is good she is finished with her antibiotics at this point. She continues to have issues that seem to be loosening up as far as the distal portion of her foot is concerned obviously I think that this is going on amputate at some point just a matter of when to be perfectly honest. Wholey, [name] ([id number]) [id number] Page 4 of 9 Electronic Signature(s) Signed: 06/21/2022 12:25:50 PM By: Allen Derry PA-C Entered By: Allen Derry on 06/21/2022 12:25:50 -------------------------------------------------------------------------------- Physician Orders Details Patient Name: Date of Service: [place], [name] 06/21/2022 12:00 PM Medical Record Number: [id number] Patient Account Number: [id number] Date of Birth/Sex: Treating RN: 1971-01-04 (51 y.o. [name] Primary Care Provider: Orson Eva Other Clinician: Betha Loa Referring Provider: Treating Provider/Extender: Samara Snide Weeks in Treatment: 90 Verbal / Phone  Orders: Yes Clinician: Huel Coventry Read Back and Verified: Yes Diagnosis Coding ICD-10 Coding Code Description M86.371 Chronic multifocal osteomyelitis, right ankle and foot E11.621 Type 2 diabetes mellitus with foot ulcer L97.514 Non-pressure chronic ulcer of other part of right foot with necrosis of bone I50.42 Chronic combined systolic (congestive) and diastolic (congestive) heart failure I10 Essential (primary) hypertension N18.6 End stage renal disease Z99.2 Dependence on renal dialysis Z99.81 Dependence on supplemental oxygen Follow-up Appointments Return Appointment in 3 weeks. Nurse Visit as needed Home Health Home Health [facility]: - [facility] for wound care. May utilize formulary equivalent dressing for wound treatment orders unless otherwise specified. Home Health Nurse may visit PRN to address patients wound care needs. Scheduled days for dressing changes to be completed; exception, patient has scheduled wound care visit that day. **Please direct any NON-WOUND related issues/requests for orders to patient's Primary Care Physician. **If current dressing causes regression in wound condition, may D/C ordered dressing product/s and apply Normal Saline Moist Dressing daily until next Wound Healing Center or Other MD appointment. **Notify Wound Healing Center of regression in wound condition at [phone]. Bathing/ Shower/ Hygiene Clean wound with Normal Saline or wound cleanser. Wash wounds with antibacterial soap and water. May shower; gently cleanse wound with antibacterial soap, rinse and pat dry prior to dressing wounds No tub bath. Off-Loading Open toe surgical shoe Medications-Please add to medication list. ntibiotic - gentamycin to open area of foot Topical A Wound Treatment Wound #2 - Foot Wound Laterality: Right, Circumferential Cleanser: Normal Saline 1 x Per Day/30 Days Discharge Instructions: Wash your hands with soap and water. Remove old  dressing, discard into plastic bag and place into trash. Cleanse the wound with Normal Saline prior to applying a clean dressing using gauze sponges, not tissues or cotton balls. Do not scrub or use excessive force. Pat dry using gauze sponges, not tissue or cotton balls. Cleanser: Soap and Water 1 x Per Day/30 Days Discharge Instructions: Gently cleanse wound with antibacterial soap, rinse and pat dry prior to dressing wounds Topical: Gentamicin 1 x Per Day/30 Days Discharge Instructions: Apply to open area of foot Prim Dressing: Gauze 1 x Per Day/30 Days ary Discharge Instructions: soak with Betadine and wrap around toes Prim  Dressing: Silvercel 4 1/4x 4 1/4 (in/in) 1 x Per Day/30 Days ary Discharge Instructions: Apply Silvercel 4 1/4x 4 1/4 (in/in) as instructed Secondary Dressing: ABD Pad 5x9 (in/in) 1 x Per Day/30 Days Discharge Instructions: Cover with ABD pad Christman, [name] ([id number]) [id number] Page 5 of 9 Secured With: Medipore T - 29M Medipore H Soft Cloth Surgical T ape ape, 2x2 (in/yd) 1 x Per Day/30 Days Secured With: Kerlix Roll Sterile or Non-Sterile 6-ply 4.5x4 (yd/yd) 1 x Per Day/30 Days Discharge Instructions: Apply Kerlix as directed Secured With: Stretch Net Dressing, Latex-free, Size 5, Small-Head / Shoulder / Thigh 1 x Per Day/30 Days Electronic Signature(s) Signed: 06/21/2022 12:43:01 PM By: Allen Derry PA-C Signed: 06/21/2022 1:22:31 PM By: Betha Loa Entered By: Betha Loa on 06/21/2022 12:23:45 -------------------------------------------------------------------------------- Problem List Details Patient Name: Date of Service: [name] 06/21/2022 12:00 PM Medical Record Number: [id number] Patient Account Number: [id number] Date of Birth/Sex: Treating RN: 03/06/1970 (51 y.o. [name] Primary Care Provider: Orson Eva Other Clinician: Betha Loa Referring Provider: Treating Provider/Extender: Samara Snide Weeks in Treatment: 58 Active Problems ICD-10 Encounter Code Description Active Date MDM Diagnosis M86.371 Chronic multifocal osteomyelitis, right ankle and foot 05/18/2021 No Yes E11.621 Type 2 diabetes mellitus with foot ulcer 05/18/2021 No Yes L97.514 Non-pressure chronic ulcer of other part of right foot with necrosis of bone 05/18/2021 No Yes I50.42 Chronic combined systolic (congestive) and diastolic (congestive) heart failure 05/18/2021 No Yes I10 Essential (primary) hypertension 05/18/2021 No Yes N18.6 End stage renal disease 05/18/2021 No Yes Z99.2 Dependence on renal dialysis 05/18/2021 No Yes Z99.81 Dependence on supplemental oxygen 05/18/2021 No Yes Inactive Problems Resolved Problems Electronic Signature(s) Signed: 06/21/2022 12:02:22 PM By: Allen Derry PA-C Entered By: Allen Derry on 06/21/2022 12:02:22 Kernan, [name] ([id number]) [id number] Page 6 of 9 -------------------------------------------------------------------------------- Progress Note Details Patient Name: Date of Service: [place], [name] 06/21/2022 12:00 PM Medical Record Number: [id number] Patient Account Number: [id number] Date of Birth/Sex: Treating RN: [birth year] (51 y.o. [name] Primary Care Provider: Orson Eva Other Clinician: Betha Loa Referring Provider: Treating Provider/Extender: Samara Snide Weeks in Treatment: 70 Subjective Chief Complaint Information obtained from Patient Right foot gangrene and osteomyelitis History of Present Illness (HPI) 10/04/2020 upon evaluation today patient presents for initial inspection here in the clinic concerning a fairly concerning wound over her right fifth toe towards the medial aspect which actually probes all the way down to bone. There is evidence of necrotic muscle as well and I do see tendon exposed as well. Subsequently this does have me very concerned about where things stand  here. Again this is the first time that I am seeing her and on top of everything else her ABIs are noncompressible. I think that arterial flow is the primary concern here. Of note the patient states this was first noted July 1. She had a x-ray on July 4 which was negative for bone infection. She has been on 2 antibiotics she is unsure of the name. With that being said her most recent hemoglobin A1c was 8.1 that was December 2022. She otherwise does have a history obviously of diabetes mellitus type 2, hypertension, end-stage renal disease with dependence on renal dialysis, and she is also dependent on supplemental oxygen. Readmission: 05/14/2021 this is a patient whom I previously seen October 04, 2020. With that being said she is subsequently on 21 October 2020 ended up having an amputation of her fifth digit of her foot unfortunately.  This had progressed fairly quickly into what sounds to been gangrene at that time. Subsequently right now based on what I am seeing the patient actually has a necrotic area on the end of her foot and has been recommended that the only option really here is going to be a amputation which would be a below-knee amputation. She is really not interested in that and wants to try to give this every chance it can to heal. For that reason I discussed with her that definitely we can see what we can do to try to improve this situation and try to help get this doing better as best we can. With that being said there is definitely a portion of her wound that is just not good to be able to heal no matter what it is already starting to auto amputate. This includes potentially digits of her foot as well based on what I am seeing. She does have confirmed osteomyelitis noted by MRI. She is also currently on IV vancomycin. She does undergo dialysis on Tuesday, Thursday, and Saturday. The patient does have a history of diabetes mellitus type 2, congestive heart failure, hypertension, end-stage  renal disease, dependence on renal dialysis, and dependence on supplemental oxygen. With that being said I am going to need to look into her heart failure and the significance of this as far as hyperbarics is concerned before we initiate treatment. She is probably also going to need an updated EKG and chest x-ray unless its already been done recently. 05/28/2021 upon evaluation today patient appears to be doing well with regard to her wound all things considered. I am going to try to see what we can do about trimmed away some of the necrotic tissue that is really just hanging on here and there and not really helpful. Feel like if we can slowly start doing this maybe we will be able to get to the point where this is better and better as far as cleaning up the surface of the wound is concerned. Still as I explained I do believe some of her toes are not salvageable but again she is looking more towards autoamputation versus want to do anything definitive here from a surgical standpoint. I have been researching whether she would be an okay candidate for hyperbarics or not and I found that her x-ray but she had a chest x-ray recently was negative for any acute disease and appear to be stable. EKG was also stable. She did also have a cardiac echo which showed that her ejection fraction was 60 to 65% and therefore I think she is a candidate for hyperbarics. Again I do believe that this can help with preventing amputation which right now she has been told that her only option would be a below-knee amputation. Nonetheless I think that if we can get the heel leading to spread and the majority of this cleared away that it is possible we might even be able to get a surgeon just to surgically remove the nonviable toes and not have to go the full way of complete amputation. Nonetheless we do have a ways to go before we will get to that point. Either way I think the hyperbarics could be an excellent support as far as  that is concerned. 06-05-2021 upon evaluation today patient's wound is showing some signs of improvement. This does seem to be slow and of course it is understandable considering what we are seeing. She has discussed and looked up information about hyperbarics and  in the end comes back today and states that she does want to proceed. I honestly do feel like that is her best chance at saving the foot. Even if we are able to get her into a surgeon after we obtain some good healing approximately so that there is not as much necrotic tissue in general she may even be able to have a transmetatarsal amputation and get this to heal. Again I am not saying this is can be an easy road but it may be her best road as far as thinking about how to achieve some type of complete healing as I do not believe the toes are viable at all to be honest. 07-06-2021 upon evaluation today patient appears to be doing well currently in regard to her foot all things considered. Again she unfortunately was unable to do the hyperbaric oxygen therapy this was due to her claustrophobia. Nonetheless she tells me that she is pleased with how the foot appears and she does feel like there is good improvement going on here. In general I think that we are on the right track. If she is going to take a very long time and to be honest the route of autoamputation which is pretty much the way this is going is not a quick or easy road but nonetheless she seems to be taking care of her foot quite well. She is in good spirits. 07-27-2021 upon evaluation today patient appears to be doing well currently in regard to her foot all things considered. A lot of the eschar is starting to lift up around the edges which is good there is still significant necrotic tissue on the distal portion of the foot. This is slowly going to work its way off. Based on what I am seeing currently I do not believe that there is any evidence of infection which is good  news. 08-31-2021 upon evaluation today patient's wound actually appears to be doing decently well. I do not see anything in particular to try to trim away today. With that being said she continues to slowly allow this to do what is going to do as far as autoamputation is concerned. I did have another conversation with her today about the possibility of seeing a surgeon for a transmetatarsal amputation which again would help to get this to close faster and obviously I think would be a much speedy year and safer way to proceed with amputation. With that being said she tells me that she would really prefer to just let this go at "God pace". 09-28-2021 upon evaluation today patient's foot actually appears to be doing much better. Fortunately I do not see any signs of infection which is great news and overall I am extremely pleased with where we stand today. There does not appear to be any signs of active infection locally or systemically which is great news. No fevers, chills, nausea, vomiting, or diarrhea. 10-26-2021 upon evaluation today patient appears to be doing well currently in regard to her foot all things considered. Fortunately I do not see any evidence of active infection locally or systemically at this time which is great news. No fevers, chills, nausea, vomiting, or diarrhea. She does have some of the eschar that is lifting up even a little bit more compared to last time I saw her. 11-30-2021 upon evaluation today patient's wound is still continuing to have some areas that lift up although there is nothing really that I feel like I could trim away too much today  to be honest. Fortunately I do not see any evidence of systemic infection though locally she has been concerned about some drainage I Rathel, [name] ([id number]) [id number] Page 7 of 9 do not see anything that seems to be actively red or erythematous or warm to touch which is good news. No fevers, chills,  nausea, vomiting, or diarrhea. 12-21-2021 upon evaluation patient's foot ulcer actually is showing signs of some improvement with healthier tissue noted at areas with eschar that keeps clearing off. There is a bit of the eschar that is loosening up today as well and I think we will get a be able to get some of this cleared away that we have been working on for a while. Fortunately I do not see any evidence of infection locally or systemically at this time which is great news. No fevers, chills, nausea, vomiting, or diarrhea. 01-11-2022 upon evaluation today patient appears to be doing decently well in regard to her foot she still has a necrotic area on the tip of her foot but she is tolerating the dressing changes without complication. Fortunately there does not appear to be any signs of infection locally or systemically at this time which is great news. No fevers, chills, nausea, vomiting, or diarrhea. 02-01-2022 upon evaluation today patient continues to show signs of improvement in regard to the area where we have been able to remove the eschar. Unfortunately the tip of the foot I think is the part that is completely dead and not going to be coming back as far as any healing is concerned. I have discussed this with the patient multiple times however she does not want to have any type of surgical intervention. Obviously in that regard we will try to do what we can as best we can. 02-15-2022 upon evaluation today patient appears to be doing well with regard to her foot. She is actually showing some signs of improvement she has had some drainage but nothing appears to be doing too poorly overall. I do not see any signs of infection and I am pleased in that regard. Fortunately I do not see any evidence of infection locally nor systemically which is great news. 03-01-2022 upon evaluation today patient appears to be doing well currently in regard to her wounds. She has been tolerating the dressing changes  without complication. Fortunately I do not see any signs of active infection locally nor systemically which is great news. No fevers, chills, nausea, vomiting, or diarrhea. 03-15-2022 upon evaluation today patient's wound continues to show signs of loosening the splint some of the eschar is concerned on the tip of her foot. Fortunately I do not see any evidence of infection systemically which is good news locally I feel like she is making good progress here. Overall I am extremely happy with where things stand. Especially considering the fact that really she does need to have an amputation of the necrotic portion of the end of her foot. Again if we got rid of those toes I do think that this could actually heal much more effectively and quickly with that being said she is completely against going down that road so we will try to do what we can as conservatively as we can while still making some progress here. 04-01-2022 upon evaluation today patient's foot unfortunately does not appear to be doing as well as I would like to see. I discussed with her today that I do believe she probably needs to be very mindful of this and how  things are progressing. I explained as well that this is what I been most worried about the whole time that I been seeing her since really at least March of last year when I started seeing her again. Subsequently she notes that she has had more drainage also feel that the drainage is more discolored which is not good. 04-08-2022 upon evaluation today patient appears to be doing well currently in regard to her foot ulcer this is much better than last week's evaluation. Fortunately I do not see any signs of active infection systemically though locally we still see some drainage I think this is much better with the doxycycline she has been taking that since we got the results back of the culture nothing has really changed in that regard. Fortunately I think that we are on the  right track. 04-15-2022 upon evaluation today patient unfortunately appears to be doing a bit worse compared to her normal. She tells me that she has been sick with what she presumed might have been a GI bug over the weekend but to be honest I am not so certain that she could not be showing early signs of sepsis. Her foot is starting to become a little bit more purulent as far as the drainage is concerned of hide on doxycycline for a couple weeks already which seemed to help initially to a degree but unfortunately I am just not sure this is maintaining. It started to get a little bit more weight around the edges and is starting to especially around the lateral toe show signs of progression at this point. I am coupled with the fact that her blood pressure is low today compared to her normal concerned that we should probably have her go to the ER for further evaluation and treatment in order to evaluate for the possibility of early sepsis. This is something we have had a close eye on for quite some time and it is something that I think needs to be fully evaluated at this point. 05-17-2022 upon evaluation today patient's wound is continuing to maintain stability for the most part. I do not see any signs of infection at this point which is good news I am pleased with that. The tip of her foot where she has gangrene also has remained stable and dry which is also good news. Overall I am pleased with what we are seeing today. 06-07-2022 upon evaluation today patient appears to be doing well currently in regard to her wound this is actually showing signs of improvement except for the distal portion where she has the necrotic tissue which again is not going to change. Fortunately I do not see any signs of active infection locally nor systemically which is great news. No fevers, chills, nausea, vomiting, or diarrhea. 06-21-2022 upon evaluation today patient appears to be doing well currently in regard to her wounds  all things considered. She still has significant issues here with her toes. Again she is previously been attempted to undergo hyperbaric oxygen therapy but did not have the ability to tolerate this. Fortunately I do not see any signs of active infection locally nor systemically at this time. Objective Constitutional Well-nourished and well-hydrated in no acute distress. Vitals Time Taken: 12:00 PM, Temperature: 99.0 F, Pulse: 108 bpm, Respiratory Rate: 18 breaths/min, Blood Pressure: 101/70 mmHg. Respiratory normal breathing without difficulty. Psychiatric this patient is able to make decisions and demonstrates good insight into disease process. Alert and Oriented x 3. pleasant and cooperative. General Notes: Upon inspection  patient's wound bed actually showed signs of good granulation epithelization at this point. Fortunately I do not see any evidence of infection which is good she is finished with her antibiotics at this point. She continues to have issues that seem to be loosening up as far as the distal portion of her foot is concerned obviously I think that this is going on amputate at some point just a matter of when to be perfectly honest. Integumentary (Hair, Skin) Wound #2 status is Open. Original cause of wound was Gradually Appeared. The date acquired was: 10/15/2020. The wound has been in treatment 57 weeks. The wound is located on the Right,Circumferential Foot. The wound measures 11.6cm length x 9.6cm width x 0.2cm depth; 87.462cm^2 area and 17.492cm^3 volume. There is Fat Layer (Subcutaneous Tissue) exposed. There is a medium amount of serosanguineous drainage noted. There is medium (34-66%) Blodgett, [name] ([id number]) [id number] Page 8 of 9 granulation within the wound bed. There is a medium (34-66%) amount of necrotic tissue within the wound bed including Eschar and Necrosis of Bone. 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 There was a Excisional Skin/Subcutaneous Tissue Debridement with a total area of 65.56 sq cm performed by Allen Derry, PA-C. With the following instrument(s): Curette to remove Viable and Non-Viable tissue/material. Material removed includes Subcutaneous Tissue, Slough, Skin: Dermis, and Skin: Epidermis. A time out was conducted at 12:18, prior to the start of the procedure. A Minimum amount of bleeding was controlled with Pressure. The procedure was tolerated well. Post Debridement Measurements: 11.6cm length x 9.6cm width x 0.2cm depth; 17.492cm^3 volume. Character of Wound/Ulcer Post Debridement is stable. Severity of Tissue Post Debridement is: Necrosis of bone. Post procedure Diagnosis Wound #2: Same as Pre-Procedure Plan Follow-up Appointments: Return Appointment in 3 weeks. Nurse Visit as needed Home Health: Home Health Company: - [facility] for wound care. May utilize formulary equivalent dressing for wound treatment orders unless otherwise specified. Home Health Nurse may visit PRN to address patientoos wound care needs. Scheduled days for dressing changes to be completed; exception, patient has scheduled wound care visit that day. **Please direct any NON-WOUND related issues/requests for orders to patient's Primary Care Physician. **If current dressing causes regression in wound condition, may D/C ordered  dressing product/s and apply Normal Saline Moist Dressing daily until next Wound Healing Center or Other MD appointment. **Notify Wound Healing Center of regression in wound condition at [phone]. Bathing/ Shower/ Hygiene: Clean wound with Normal Saline or wound cleanser. Wash wounds with antibacterial soap and water. May shower; gently cleanse wound with antibacterial soap, rinse and pat dry prior to dressing wounds No tub bath. Off-Loading: Open toe surgical shoe Medications-Please add to medication list.: Topical Antibiotic - gentamycin to open area of foot WOUND #2: - Foot Wound Laterality: Right, Circumferential Cleanser: Normal Saline 1 x Per Day/30 Days Discharge Instructions: Wash your hands with soap and water. Remove old dressing, discard into plastic bag and place into trash. Cleanse the wound with Normal Saline prior to applying a clean dressing using  gauze sponges, not tissues or cotton balls. Do not scrub or use excessive force. Pat dry using gauze sponges, not tissue or cotton balls. Cleanser: Soap and Water 1 x Per Day/30 Days Discharge Instructions: Gently cleanse wound with antibacterial soap, rinse and pat dry prior to dressing wounds Topical: Gentamicin 1 x Per Day/30 Days Discharge Instructions: Apply to open area of foot Prim Dressing: Gauze 1 x Per Day/30 Days ary Discharge Instructions: soak with Betadine and wrap around toes Prim Dressing: Silvercel 4 1/4x 4 1/4 (in/in) 1 x Per Day/30 Days ary Discharge Instructions: Apply Silvercel 4 1/4x 4 1/4 (in/in) as instructed Secondary Dressing: ABD Pad 5x9 (in/in) 1 x Per Day/30 Days Discharge Instructions: Cover with ABD pad Secured With: Medipore T - 63M Medipore H Soft Cloth Surgical T ape ape, 2x2 (in/yd) 1 x Per Day/30 Days Secured With: [employer] Sterile or Non-Sterile 6-ply 4.5x4 (yd/yd) 1 x Per Day/30 Days Discharge Instructions: Apply Kerlix as directed Secured With: [employer] Dressing, Latex-free,  Size 5, Small-Head / Shoulder / Thigh 1 x Per Day/30 Days 1. I would recommend currently that we have the patient continue to monitor for any signs of infection or worsening. I think that we should obviously continue to evaluate and see how things are proceeding. I do believe that she is making some pretty good progress here considering where we have come from nonetheless this is still very slow to heal and I think she is still working towards autoamputation as far as the distal portion of her foot is concerned. Spera, [name] ([id number]) [id number] Page 9 of 9 2. I am good recommend as well that we have the patient continue to offload is much as possible she needs to keep pressure off is much as she can and obviously the more that she can do the better. That is going to be the key thing as far as getting this to heal appropriately. We will see patient back for reevaluation in 3 weeks here in the clinic. If anything worsens or changes patient will contact our office for additional recommendations. Electronic Signature(s) Signed: 06/21/2022 12:26:46 PM By: Allen Derry PA-C Entered By: Allen Derry on 06/21/2022 12:26:46 -------------------------------------------------------------------------------- SuperBill Details Patient Name: Date of Service: [place], [name] 06/21/2022 Medical Record Number: [id number] Patient Account Number: [id number] Date of Birth/Sex: Treating RN: 1970/07/10 (51 y.o. [name] Primary Care Provider: Orson Eva Other Clinician: Betha Loa Referring Provider: Treating Provider/Extender: Samara Snide Weeks in Treatment: 29 Diagnosis Coding ICD-10 Codes Code Description M86.371 Chronic multifocal osteomyelitis, right ankle and foot E11.621 Type 2 diabetes mellitus with foot ulcer L97.514 Non-pressure chronic ulcer of other part of right foot with necrosis of bone I50.42 Chronic combined systolic (congestive)  and diastolic (congestive) heart failure I10 Essential (primary) hypertension N18.6 End stage renal disease Z99.2 Dependence on renal dialysis Z99.81 Dependence on supplemental oxygen Facility Procedures : 3 CPT4 Code: 7829562 Description: 11042 - DEB SUBQ TISSUE 20 SQ CM/< ICD-10 Diagnosis Description L97.514 Non-pressure chronic ulcer of other part of right foot with necrosis of bone Modifier: Quantity: 1 : 3 CPT4 Code: 1308657 Description: 11045 - DEB SUBQ TISS EA ADDL 20CM ICD-10 Diagnosis Description L97.514 Non-pressure chronic ulcer of other part of right foot with necrosis of bone Modifier: Quantity: 3 Physician Procedures : CPT4 Code Description Modifier [id number] 11042 - WC PHYS SUBQ TISS 20 SQ CM ICD-10 Diagnosis Description L97.514 Non-pressure chronic ulcer of other part of right foot with necrosis of bone Quantity: 1 :  [id number] 11045 - WC PHYS SUBQ TISS EA ADDL 20 CM ICD-10 Diagnosis Description L97.514 Non-pressure chronic ulcer of other part of right foot with necrosis of bone Quantity: 3 Electronic Signature(s) Signed: 06/21/2022 12:27:33 PM By: Allen Derry PA-C Entered By: Allen Derry on 06/21/2022 12:27:33

## 2022-06-24 DIAGNOSIS — E1151 Type 2 diabetes mellitus with diabetic peripheral angiopathy without gangrene: Secondary | ICD-10-CM | POA: Diagnosis not present

## 2022-06-24 DIAGNOSIS — I951 Orthostatic hypotension: Secondary | ICD-10-CM | POA: Diagnosis not present

## 2022-06-24 DIAGNOSIS — Z992 Dependence on renal dialysis: Secondary | ICD-10-CM | POA: Diagnosis not present

## 2022-06-24 DIAGNOSIS — I132 Hypertensive heart and chronic kidney disease with heart failure and with stage 5 chronic kidney disease, or end stage renal disease: Secondary | ICD-10-CM | POA: Diagnosis not present

## 2022-06-24 DIAGNOSIS — E1169 Type 2 diabetes mellitus with other specified complication: Secondary | ICD-10-CM | POA: Diagnosis not present

## 2022-06-24 DIAGNOSIS — E872 Acidosis, unspecified: Secondary | ICD-10-CM | POA: Diagnosis not present

## 2022-06-24 DIAGNOSIS — I70203 Unspecified atherosclerosis of native arteries of extremities, bilateral legs: Secondary | ICD-10-CM | POA: Diagnosis not present

## 2022-06-24 DIAGNOSIS — N186 End stage renal disease: Secondary | ICD-10-CM | POA: Diagnosis not present

## 2022-06-24 DIAGNOSIS — E1165 Type 2 diabetes mellitus with hyperglycemia: Secondary | ICD-10-CM | POA: Diagnosis not present

## 2022-06-24 DIAGNOSIS — I5032 Chronic diastolic (congestive) heart failure: Secondary | ICD-10-CM | POA: Diagnosis not present

## 2022-06-24 DIAGNOSIS — Z7985 Long-term (current) use of injectable non-insulin antidiabetic drugs: Secondary | ICD-10-CM | POA: Diagnosis not present

## 2022-06-24 DIAGNOSIS — D631 Anemia in chronic kidney disease: Secondary | ICD-10-CM | POA: Diagnosis not present

## 2022-06-24 DIAGNOSIS — L97513 Non-pressure chronic ulcer of other part of right foot with necrosis of muscle: Secondary | ICD-10-CM | POA: Diagnosis not present

## 2022-06-24 DIAGNOSIS — J9612 Chronic respiratory failure with hypercapnia: Secondary | ICD-10-CM | POA: Diagnosis not present

## 2022-06-24 DIAGNOSIS — E1122 Type 2 diabetes mellitus with diabetic chronic kidney disease: Secondary | ICD-10-CM | POA: Diagnosis not present

## 2022-06-24 DIAGNOSIS — E11621 Type 2 diabetes mellitus with foot ulcer: Secondary | ICD-10-CM | POA: Diagnosis not present

## 2022-06-24 DIAGNOSIS — M86371 Chronic multifocal osteomyelitis, right ankle and foot: Secondary | ICD-10-CM | POA: Diagnosis not present

## 2022-06-24 DIAGNOSIS — J9611 Chronic respiratory failure with hypoxia: Secondary | ICD-10-CM | POA: Diagnosis not present

## 2022-06-24 DIAGNOSIS — Z794 Long term (current) use of insulin: Secondary | ICD-10-CM | POA: Diagnosis not present

## 2022-06-24 DIAGNOSIS — J441 Chronic obstructive pulmonary disease with (acute) exacerbation: Secondary | ICD-10-CM | POA: Diagnosis not present

## 2022-06-24 DIAGNOSIS — Z9981 Dependence on supplemental oxygen: Secondary | ICD-10-CM | POA: Diagnosis not present

## 2022-06-25 DIAGNOSIS — N2581 Secondary hyperparathyroidism of renal origin: Secondary | ICD-10-CM | POA: Diagnosis not present

## 2022-06-25 DIAGNOSIS — Z992 Dependence on renal dialysis: Secondary | ICD-10-CM | POA: Diagnosis not present

## 2022-06-25 DIAGNOSIS — M869 Osteomyelitis, unspecified: Secondary | ICD-10-CM | POA: Diagnosis not present

## 2022-06-25 DIAGNOSIS — N186 End stage renal disease: Secondary | ICD-10-CM | POA: Diagnosis not present

## 2022-06-25 DIAGNOSIS — M86171 Other acute osteomyelitis, right ankle and foot: Secondary | ICD-10-CM | POA: Diagnosis not present

## 2022-06-26 ENCOUNTER — Telehealth: Payer: Self-pay | Admitting: Anesthesiology

## 2022-06-26 DIAGNOSIS — I5040 Unspecified combined systolic (congestive) and diastolic (congestive) heart failure: Secondary | ICD-10-CM | POA: Diagnosis not present

## 2022-06-26 DIAGNOSIS — I70203 Unspecified atherosclerosis of native arteries of extremities, bilateral legs: Secondary | ICD-10-CM | POA: Diagnosis not present

## 2022-06-26 DIAGNOSIS — J9612 Chronic respiratory failure with hypercapnia: Secondary | ICD-10-CM | POA: Diagnosis not present

## 2022-06-26 DIAGNOSIS — E1151 Type 2 diabetes mellitus with diabetic peripheral angiopathy without gangrene: Secondary | ICD-10-CM | POA: Diagnosis not present

## 2022-06-26 DIAGNOSIS — N186 End stage renal disease: Secondary | ICD-10-CM | POA: Diagnosis not present

## 2022-06-26 DIAGNOSIS — J45909 Unspecified asthma, uncomplicated: Secondary | ICD-10-CM | POA: Diagnosis not present

## 2022-06-26 DIAGNOSIS — Z794 Long term (current) use of insulin: Secondary | ICD-10-CM | POA: Diagnosis not present

## 2022-06-26 DIAGNOSIS — I951 Orthostatic hypotension: Secondary | ICD-10-CM | POA: Diagnosis not present

## 2022-06-26 DIAGNOSIS — E1165 Type 2 diabetes mellitus with hyperglycemia: Secondary | ICD-10-CM | POA: Diagnosis not present

## 2022-06-26 DIAGNOSIS — E1122 Type 2 diabetes mellitus with diabetic chronic kidney disease: Secondary | ICD-10-CM | POA: Diagnosis not present

## 2022-06-26 DIAGNOSIS — M86371 Chronic multifocal osteomyelitis, right ankle and foot: Secondary | ICD-10-CM | POA: Diagnosis not present

## 2022-06-26 DIAGNOSIS — E1169 Type 2 diabetes mellitus with other specified complication: Secondary | ICD-10-CM | POA: Diagnosis not present

## 2022-06-26 DIAGNOSIS — Z9981 Dependence on supplemental oxygen: Secondary | ICD-10-CM | POA: Diagnosis not present

## 2022-06-26 DIAGNOSIS — D631 Anemia in chronic kidney disease: Secondary | ICD-10-CM | POA: Diagnosis not present

## 2022-06-26 DIAGNOSIS — J9611 Chronic respiratory failure with hypoxia: Secondary | ICD-10-CM | POA: Diagnosis not present

## 2022-06-26 DIAGNOSIS — E11621 Type 2 diabetes mellitus with foot ulcer: Secondary | ICD-10-CM | POA: Diagnosis not present

## 2022-06-26 DIAGNOSIS — I132 Hypertensive heart and chronic kidney disease with heart failure and with stage 5 chronic kidney disease, or end stage renal disease: Secondary | ICD-10-CM | POA: Diagnosis not present

## 2022-06-26 DIAGNOSIS — L97513 Non-pressure chronic ulcer of other part of right foot with necrosis of muscle: Secondary | ICD-10-CM | POA: Diagnosis not present

## 2022-06-26 DIAGNOSIS — Z992 Dependence on renal dialysis: Secondary | ICD-10-CM | POA: Diagnosis not present

## 2022-06-26 DIAGNOSIS — I5032 Chronic diastolic (congestive) heart failure: Secondary | ICD-10-CM | POA: Diagnosis not present

## 2022-06-26 DIAGNOSIS — E872 Acidosis, unspecified: Secondary | ICD-10-CM | POA: Diagnosis not present

## 2022-06-26 DIAGNOSIS — J441 Chronic obstructive pulmonary disease with (acute) exacerbation: Secondary | ICD-10-CM | POA: Diagnosis not present

## 2022-06-26 DIAGNOSIS — Z7985 Long-term (current) use of injectable non-insulin antidiabetic drugs: Secondary | ICD-10-CM | POA: Diagnosis not present

## 2022-06-26 NOTE — Telephone Encounter (Signed)
Can you put in an order for this patient to have a procedure?

## 2022-06-26 NOTE — Telephone Encounter (Signed)
Patient is calling about her MNB. Alona Bene needs orders put in before she can get it approved. Please ask Dr Pernell Dupre to put in order for this patient

## 2022-06-27 ENCOUNTER — Other Ambulatory Visit: Payer: Self-pay | Admitting: Nurse Practitioner

## 2022-06-27 DIAGNOSIS — N2581 Secondary hyperparathyroidism of renal origin: Secondary | ICD-10-CM | POA: Diagnosis not present

## 2022-06-27 DIAGNOSIS — Z992 Dependence on renal dialysis: Secondary | ICD-10-CM | POA: Diagnosis not present

## 2022-06-27 DIAGNOSIS — N186 End stage renal disease: Secondary | ICD-10-CM | POA: Diagnosis not present

## 2022-06-28 DIAGNOSIS — I132 Hypertensive heart and chronic kidney disease with heart failure and with stage 5 chronic kidney disease, or end stage renal disease: Secondary | ICD-10-CM | POA: Diagnosis not present

## 2022-06-28 DIAGNOSIS — J9611 Chronic respiratory failure with hypoxia: Secondary | ICD-10-CM | POA: Diagnosis not present

## 2022-06-28 DIAGNOSIS — N186 End stage renal disease: Secondary | ICD-10-CM | POA: Diagnosis not present

## 2022-06-28 DIAGNOSIS — E1151 Type 2 diabetes mellitus with diabetic peripheral angiopathy without gangrene: Secondary | ICD-10-CM | POA: Diagnosis not present

## 2022-06-28 DIAGNOSIS — E11621 Type 2 diabetes mellitus with foot ulcer: Secondary | ICD-10-CM | POA: Diagnosis not present

## 2022-06-28 DIAGNOSIS — Z9981 Dependence on supplemental oxygen: Secondary | ICD-10-CM | POA: Diagnosis not present

## 2022-06-28 DIAGNOSIS — E1122 Type 2 diabetes mellitus with diabetic chronic kidney disease: Secondary | ICD-10-CM | POA: Diagnosis not present

## 2022-06-28 DIAGNOSIS — I951 Orthostatic hypotension: Secondary | ICD-10-CM | POA: Diagnosis not present

## 2022-06-28 DIAGNOSIS — J441 Chronic obstructive pulmonary disease with (acute) exacerbation: Secondary | ICD-10-CM | POA: Diagnosis not present

## 2022-06-28 DIAGNOSIS — I70203 Unspecified atherosclerosis of native arteries of extremities, bilateral legs: Secondary | ICD-10-CM | POA: Diagnosis not present

## 2022-06-28 DIAGNOSIS — E1169 Type 2 diabetes mellitus with other specified complication: Secondary | ICD-10-CM | POA: Diagnosis not present

## 2022-06-28 DIAGNOSIS — Z992 Dependence on renal dialysis: Secondary | ICD-10-CM | POA: Diagnosis not present

## 2022-06-28 DIAGNOSIS — E1165 Type 2 diabetes mellitus with hyperglycemia: Secondary | ICD-10-CM | POA: Diagnosis not present

## 2022-06-28 DIAGNOSIS — Z794 Long term (current) use of insulin: Secondary | ICD-10-CM | POA: Diagnosis not present

## 2022-06-28 DIAGNOSIS — E872 Acidosis, unspecified: Secondary | ICD-10-CM | POA: Diagnosis not present

## 2022-06-28 DIAGNOSIS — L97513 Non-pressure chronic ulcer of other part of right foot with necrosis of muscle: Secondary | ICD-10-CM | POA: Diagnosis not present

## 2022-06-28 DIAGNOSIS — Z7985 Long-term (current) use of injectable non-insulin antidiabetic drugs: Secondary | ICD-10-CM | POA: Diagnosis not present

## 2022-06-28 DIAGNOSIS — J9612 Chronic respiratory failure with hypercapnia: Secondary | ICD-10-CM | POA: Diagnosis not present

## 2022-06-28 DIAGNOSIS — I5032 Chronic diastolic (congestive) heart failure: Secondary | ICD-10-CM | POA: Diagnosis not present

## 2022-06-28 DIAGNOSIS — D631 Anemia in chronic kidney disease: Secondary | ICD-10-CM | POA: Diagnosis not present

## 2022-06-28 DIAGNOSIS — M86371 Chronic multifocal osteomyelitis, right ankle and foot: Secondary | ICD-10-CM | POA: Diagnosis not present

## 2022-06-29 ENCOUNTER — Other Ambulatory Visit: Payer: Self-pay | Admitting: Nurse Practitioner

## 2022-07-01 DIAGNOSIS — E1169 Type 2 diabetes mellitus with other specified complication: Secondary | ICD-10-CM | POA: Diagnosis not present

## 2022-07-01 DIAGNOSIS — J441 Chronic obstructive pulmonary disease with (acute) exacerbation: Secondary | ICD-10-CM | POA: Diagnosis not present

## 2022-07-01 DIAGNOSIS — M86371 Chronic multifocal osteomyelitis, right ankle and foot: Secondary | ICD-10-CM | POA: Diagnosis not present

## 2022-07-01 DIAGNOSIS — I132 Hypertensive heart and chronic kidney disease with heart failure and with stage 5 chronic kidney disease, or end stage renal disease: Secondary | ICD-10-CM | POA: Diagnosis not present

## 2022-07-01 DIAGNOSIS — J9612 Chronic respiratory failure with hypercapnia: Secondary | ICD-10-CM | POA: Diagnosis not present

## 2022-07-01 DIAGNOSIS — E1151 Type 2 diabetes mellitus with diabetic peripheral angiopathy without gangrene: Secondary | ICD-10-CM | POA: Diagnosis not present

## 2022-07-01 DIAGNOSIS — E1165 Type 2 diabetes mellitus with hyperglycemia: Secondary | ICD-10-CM | POA: Diagnosis not present

## 2022-07-01 DIAGNOSIS — I70203 Unspecified atherosclerosis of native arteries of extremities, bilateral legs: Secondary | ICD-10-CM | POA: Diagnosis not present

## 2022-07-01 DIAGNOSIS — N186 End stage renal disease: Secondary | ICD-10-CM | POA: Diagnosis not present

## 2022-07-01 DIAGNOSIS — L97513 Non-pressure chronic ulcer of other part of right foot with necrosis of muscle: Secondary | ICD-10-CM | POA: Diagnosis not present

## 2022-07-01 DIAGNOSIS — D631 Anemia in chronic kidney disease: Secondary | ICD-10-CM | POA: Diagnosis not present

## 2022-07-01 DIAGNOSIS — E1122 Type 2 diabetes mellitus with diabetic chronic kidney disease: Secondary | ICD-10-CM | POA: Diagnosis not present

## 2022-07-01 DIAGNOSIS — Z794 Long term (current) use of insulin: Secondary | ICD-10-CM | POA: Diagnosis not present

## 2022-07-01 DIAGNOSIS — J9611 Chronic respiratory failure with hypoxia: Secondary | ICD-10-CM | POA: Diagnosis not present

## 2022-07-01 DIAGNOSIS — I5032 Chronic diastolic (congestive) heart failure: Secondary | ICD-10-CM | POA: Diagnosis not present

## 2022-07-01 DIAGNOSIS — Z992 Dependence on renal dialysis: Secondary | ICD-10-CM | POA: Diagnosis not present

## 2022-07-01 DIAGNOSIS — E872 Acidosis, unspecified: Secondary | ICD-10-CM | POA: Diagnosis not present

## 2022-07-01 DIAGNOSIS — I951 Orthostatic hypotension: Secondary | ICD-10-CM | POA: Diagnosis not present

## 2022-07-01 DIAGNOSIS — Z7985 Long-term (current) use of injectable non-insulin antidiabetic drugs: Secondary | ICD-10-CM | POA: Diagnosis not present

## 2022-07-01 DIAGNOSIS — Z9981 Dependence on supplemental oxygen: Secondary | ICD-10-CM | POA: Diagnosis not present

## 2022-07-01 DIAGNOSIS — E11621 Type 2 diabetes mellitus with foot ulcer: Secondary | ICD-10-CM | POA: Diagnosis not present

## 2022-07-02 DIAGNOSIS — Z992 Dependence on renal dialysis: Secondary | ICD-10-CM | POA: Diagnosis not present

## 2022-07-02 DIAGNOSIS — N2581 Secondary hyperparathyroidism of renal origin: Secondary | ICD-10-CM | POA: Diagnosis not present

## 2022-07-02 DIAGNOSIS — N186 End stage renal disease: Secondary | ICD-10-CM | POA: Diagnosis not present

## 2022-07-02 NOTE — Addendum Note (Signed)
Addended by: Yevette Edwards on: 07/02/2022 09:56 AM   Modules accepted: Orders

## 2022-07-03 DIAGNOSIS — D631 Anemia in chronic kidney disease: Secondary | ICD-10-CM | POA: Diagnosis not present

## 2022-07-03 DIAGNOSIS — E1151 Type 2 diabetes mellitus with diabetic peripheral angiopathy without gangrene: Secondary | ICD-10-CM | POA: Diagnosis not present

## 2022-07-03 DIAGNOSIS — J441 Chronic obstructive pulmonary disease with (acute) exacerbation: Secondary | ICD-10-CM | POA: Diagnosis not present

## 2022-07-03 DIAGNOSIS — I5032 Chronic diastolic (congestive) heart failure: Secondary | ICD-10-CM | POA: Diagnosis not present

## 2022-07-03 DIAGNOSIS — N186 End stage renal disease: Secondary | ICD-10-CM | POA: Diagnosis not present

## 2022-07-03 DIAGNOSIS — Z7985 Long-term (current) use of injectable non-insulin antidiabetic drugs: Secondary | ICD-10-CM | POA: Diagnosis not present

## 2022-07-03 DIAGNOSIS — L97513 Non-pressure chronic ulcer of other part of right foot with necrosis of muscle: Secondary | ICD-10-CM | POA: Diagnosis not present

## 2022-07-03 DIAGNOSIS — I132 Hypertensive heart and chronic kidney disease with heart failure and with stage 5 chronic kidney disease, or end stage renal disease: Secondary | ICD-10-CM | POA: Diagnosis not present

## 2022-07-03 DIAGNOSIS — J9612 Chronic respiratory failure with hypercapnia: Secondary | ICD-10-CM | POA: Diagnosis not present

## 2022-07-03 DIAGNOSIS — Z992 Dependence on renal dialysis: Secondary | ICD-10-CM | POA: Diagnosis not present

## 2022-07-03 DIAGNOSIS — I951 Orthostatic hypotension: Secondary | ICD-10-CM | POA: Diagnosis not present

## 2022-07-03 DIAGNOSIS — J9611 Chronic respiratory failure with hypoxia: Secondary | ICD-10-CM | POA: Diagnosis not present

## 2022-07-03 DIAGNOSIS — M86371 Chronic multifocal osteomyelitis, right ankle and foot: Secondary | ICD-10-CM | POA: Diagnosis not present

## 2022-07-03 DIAGNOSIS — Z9981 Dependence on supplemental oxygen: Secondary | ICD-10-CM | POA: Diagnosis not present

## 2022-07-03 DIAGNOSIS — Z794 Long term (current) use of insulin: Secondary | ICD-10-CM | POA: Diagnosis not present

## 2022-07-03 DIAGNOSIS — I70203 Unspecified atherosclerosis of native arteries of extremities, bilateral legs: Secondary | ICD-10-CM | POA: Diagnosis not present

## 2022-07-03 DIAGNOSIS — E1122 Type 2 diabetes mellitus with diabetic chronic kidney disease: Secondary | ICD-10-CM | POA: Diagnosis not present

## 2022-07-03 DIAGNOSIS — E1169 Type 2 diabetes mellitus with other specified complication: Secondary | ICD-10-CM | POA: Diagnosis not present

## 2022-07-03 DIAGNOSIS — E1165 Type 2 diabetes mellitus with hyperglycemia: Secondary | ICD-10-CM | POA: Diagnosis not present

## 2022-07-03 DIAGNOSIS — E11621 Type 2 diabetes mellitus with foot ulcer: Secondary | ICD-10-CM | POA: Diagnosis not present

## 2022-07-03 DIAGNOSIS — E872 Acidosis, unspecified: Secondary | ICD-10-CM | POA: Diagnosis not present

## 2022-07-04 DIAGNOSIS — Z992 Dependence on renal dialysis: Secondary | ICD-10-CM | POA: Diagnosis not present

## 2022-07-04 DIAGNOSIS — N186 End stage renal disease: Secondary | ICD-10-CM | POA: Diagnosis not present

## 2022-07-04 DIAGNOSIS — N2581 Secondary hyperparathyroidism of renal origin: Secondary | ICD-10-CM | POA: Diagnosis not present

## 2022-07-06 DIAGNOSIS — N2581 Secondary hyperparathyroidism of renal origin: Secondary | ICD-10-CM | POA: Diagnosis not present

## 2022-07-06 DIAGNOSIS — I5032 Chronic diastolic (congestive) heart failure: Secondary | ICD-10-CM | POA: Diagnosis not present

## 2022-07-06 DIAGNOSIS — E1169 Type 2 diabetes mellitus with other specified complication: Secondary | ICD-10-CM | POA: Diagnosis not present

## 2022-07-06 DIAGNOSIS — E11621 Type 2 diabetes mellitus with foot ulcer: Secondary | ICD-10-CM | POA: Diagnosis not present

## 2022-07-06 DIAGNOSIS — M86371 Chronic multifocal osteomyelitis, right ankle and foot: Secondary | ICD-10-CM | POA: Diagnosis not present

## 2022-07-06 DIAGNOSIS — L97513 Non-pressure chronic ulcer of other part of right foot with necrosis of muscle: Secondary | ICD-10-CM | POA: Diagnosis not present

## 2022-07-06 DIAGNOSIS — Z992 Dependence on renal dialysis: Secondary | ICD-10-CM | POA: Diagnosis not present

## 2022-07-06 DIAGNOSIS — Z794 Long term (current) use of insulin: Secondary | ICD-10-CM | POA: Diagnosis not present

## 2022-07-06 DIAGNOSIS — I951 Orthostatic hypotension: Secondary | ICD-10-CM | POA: Diagnosis not present

## 2022-07-06 DIAGNOSIS — E1122 Type 2 diabetes mellitus with diabetic chronic kidney disease: Secondary | ICD-10-CM | POA: Diagnosis not present

## 2022-07-06 DIAGNOSIS — E1165 Type 2 diabetes mellitus with hyperglycemia: Secondary | ICD-10-CM | POA: Diagnosis not present

## 2022-07-06 DIAGNOSIS — J9612 Chronic respiratory failure with hypercapnia: Secondary | ICD-10-CM | POA: Diagnosis not present

## 2022-07-06 DIAGNOSIS — E872 Acidosis, unspecified: Secondary | ICD-10-CM | POA: Diagnosis not present

## 2022-07-06 DIAGNOSIS — J9611 Chronic respiratory failure with hypoxia: Secondary | ICD-10-CM | POA: Diagnosis not present

## 2022-07-06 DIAGNOSIS — Z9981 Dependence on supplemental oxygen: Secondary | ICD-10-CM | POA: Diagnosis not present

## 2022-07-06 DIAGNOSIS — I70203 Unspecified atherosclerosis of native arteries of extremities, bilateral legs: Secondary | ICD-10-CM | POA: Diagnosis not present

## 2022-07-06 DIAGNOSIS — D631 Anemia in chronic kidney disease: Secondary | ICD-10-CM | POA: Diagnosis not present

## 2022-07-06 DIAGNOSIS — E1151 Type 2 diabetes mellitus with diabetic peripheral angiopathy without gangrene: Secondary | ICD-10-CM | POA: Diagnosis not present

## 2022-07-06 DIAGNOSIS — Z7985 Long-term (current) use of injectable non-insulin antidiabetic drugs: Secondary | ICD-10-CM | POA: Diagnosis not present

## 2022-07-06 DIAGNOSIS — N186 End stage renal disease: Secondary | ICD-10-CM | POA: Diagnosis not present

## 2022-07-06 DIAGNOSIS — J441 Chronic obstructive pulmonary disease with (acute) exacerbation: Secondary | ICD-10-CM | POA: Diagnosis not present

## 2022-07-06 DIAGNOSIS — I132 Hypertensive heart and chronic kidney disease with heart failure and with stage 5 chronic kidney disease, or end stage renal disease: Secondary | ICD-10-CM | POA: Diagnosis not present

## 2022-07-08 DIAGNOSIS — D631 Anemia in chronic kidney disease: Secondary | ICD-10-CM | POA: Diagnosis not present

## 2022-07-08 DIAGNOSIS — J9612 Chronic respiratory failure with hypercapnia: Secondary | ICD-10-CM | POA: Diagnosis not present

## 2022-07-08 DIAGNOSIS — E1151 Type 2 diabetes mellitus with diabetic peripheral angiopathy without gangrene: Secondary | ICD-10-CM | POA: Diagnosis not present

## 2022-07-08 DIAGNOSIS — Z7985 Long-term (current) use of injectable non-insulin antidiabetic drugs: Secondary | ICD-10-CM | POA: Diagnosis not present

## 2022-07-08 DIAGNOSIS — E872 Acidosis, unspecified: Secondary | ICD-10-CM | POA: Diagnosis not present

## 2022-07-08 DIAGNOSIS — E11621 Type 2 diabetes mellitus with foot ulcer: Secondary | ICD-10-CM | POA: Diagnosis not present

## 2022-07-08 DIAGNOSIS — I951 Orthostatic hypotension: Secondary | ICD-10-CM | POA: Diagnosis not present

## 2022-07-08 DIAGNOSIS — E1165 Type 2 diabetes mellitus with hyperglycemia: Secondary | ICD-10-CM | POA: Diagnosis not present

## 2022-07-08 DIAGNOSIS — J441 Chronic obstructive pulmonary disease with (acute) exacerbation: Secondary | ICD-10-CM | POA: Diagnosis not present

## 2022-07-08 DIAGNOSIS — I5032 Chronic diastolic (congestive) heart failure: Secondary | ICD-10-CM | POA: Diagnosis not present

## 2022-07-08 DIAGNOSIS — N186 End stage renal disease: Secondary | ICD-10-CM | POA: Diagnosis not present

## 2022-07-08 DIAGNOSIS — J9611 Chronic respiratory failure with hypoxia: Secondary | ICD-10-CM | POA: Diagnosis not present

## 2022-07-08 DIAGNOSIS — I132 Hypertensive heart and chronic kidney disease with heart failure and with stage 5 chronic kidney disease, or end stage renal disease: Secondary | ICD-10-CM | POA: Diagnosis not present

## 2022-07-08 DIAGNOSIS — Z9981 Dependence on supplemental oxygen: Secondary | ICD-10-CM | POA: Diagnosis not present

## 2022-07-08 DIAGNOSIS — E1169 Type 2 diabetes mellitus with other specified complication: Secondary | ICD-10-CM | POA: Diagnosis not present

## 2022-07-08 DIAGNOSIS — L97513 Non-pressure chronic ulcer of other part of right foot with necrosis of muscle: Secondary | ICD-10-CM | POA: Diagnosis not present

## 2022-07-08 DIAGNOSIS — Z992 Dependence on renal dialysis: Secondary | ICD-10-CM | POA: Diagnosis not present

## 2022-07-08 DIAGNOSIS — Z794 Long term (current) use of insulin: Secondary | ICD-10-CM | POA: Diagnosis not present

## 2022-07-08 DIAGNOSIS — E1122 Type 2 diabetes mellitus with diabetic chronic kidney disease: Secondary | ICD-10-CM | POA: Diagnosis not present

## 2022-07-08 DIAGNOSIS — M86371 Chronic multifocal osteomyelitis, right ankle and foot: Secondary | ICD-10-CM | POA: Diagnosis not present

## 2022-07-08 DIAGNOSIS — I70203 Unspecified atherosclerosis of native arteries of extremities, bilateral legs: Secondary | ICD-10-CM | POA: Diagnosis not present

## 2022-07-09 DIAGNOSIS — N186 End stage renal disease: Secondary | ICD-10-CM | POA: Diagnosis not present

## 2022-07-09 DIAGNOSIS — M869 Osteomyelitis, unspecified: Secondary | ICD-10-CM | POA: Diagnosis not present

## 2022-07-09 DIAGNOSIS — Z992 Dependence on renal dialysis: Secondary | ICD-10-CM | POA: Diagnosis not present

## 2022-07-09 DIAGNOSIS — N2581 Secondary hyperparathyroidism of renal origin: Secondary | ICD-10-CM | POA: Diagnosis not present

## 2022-07-11 DIAGNOSIS — Z992 Dependence on renal dialysis: Secondary | ICD-10-CM | POA: Diagnosis not present

## 2022-07-11 DIAGNOSIS — N186 End stage renal disease: Secondary | ICD-10-CM | POA: Diagnosis not present

## 2022-07-11 DIAGNOSIS — M62830 Muscle spasm of back: Secondary | ICD-10-CM | POA: Diagnosis not present

## 2022-07-11 DIAGNOSIS — G894 Chronic pain syndrome: Secondary | ICD-10-CM | POA: Diagnosis not present

## 2022-07-11 DIAGNOSIS — N2581 Secondary hyperparathyroidism of renal origin: Secondary | ICD-10-CM | POA: Diagnosis not present

## 2022-07-12 ENCOUNTER — Encounter: Payer: 59 | Attending: Physician Assistant | Admitting: Physician Assistant

## 2022-07-12 DIAGNOSIS — M86371 Chronic multifocal osteomyelitis, right ankle and foot: Secondary | ICD-10-CM | POA: Insufficient documentation

## 2022-07-12 DIAGNOSIS — L97514 Non-pressure chronic ulcer of other part of right foot with necrosis of bone: Secondary | ICD-10-CM | POA: Insufficient documentation

## 2022-07-12 DIAGNOSIS — E1122 Type 2 diabetes mellitus with diabetic chronic kidney disease: Secondary | ICD-10-CM | POA: Insufficient documentation

## 2022-07-12 DIAGNOSIS — I1A0 Resistant hypertension: Secondary | ICD-10-CM | POA: Insufficient documentation

## 2022-07-12 DIAGNOSIS — Z992 Dependence on renal dialysis: Secondary | ICD-10-CM | POA: Insufficient documentation

## 2022-07-12 DIAGNOSIS — I132 Hypertensive heart and chronic kidney disease with heart failure and with stage 5 chronic kidney disease, or end stage renal disease: Secondary | ICD-10-CM | POA: Insufficient documentation

## 2022-07-12 DIAGNOSIS — I5042 Chronic combined systolic (congestive) and diastolic (congestive) heart failure: Secondary | ICD-10-CM | POA: Diagnosis not present

## 2022-07-12 DIAGNOSIS — E1169 Type 2 diabetes mellitus with other specified complication: Secondary | ICD-10-CM | POA: Diagnosis not present

## 2022-07-12 DIAGNOSIS — N186 End stage renal disease: Secondary | ICD-10-CM | POA: Diagnosis not present

## 2022-07-12 DIAGNOSIS — E11621 Type 2 diabetes mellitus with foot ulcer: Secondary | ICD-10-CM | POA: Diagnosis not present

## 2022-07-12 DIAGNOSIS — I5040 Unspecified combined systolic (congestive) and diastolic (congestive) heart failure: Secondary | ICD-10-CM | POA: Diagnosis not present

## 2022-07-12 NOTE — Progress Notes (Signed)
Verde, [name] ([id number]) [id number] Page 1 of 2 Visit Report for 07/12/2022 Chief Complaint Document Details Patient Name: Date of Service: [name], [name] 07/12/2022 12:00 PM Medical Record Number: [id number] Patient Account Number: [id number] Date of Birth/Sex: Treating RN: December 06, 1970 (51 y.o. [name] Primary Care Provider: Orson Eva Other Clinician: Betha Loa Referring Provider: Treating Provider/Extender: Samara Snide Weeks in Treatment: 57 Information Obtained from: Patient Chief Complaint Right foot gangrene and osteomyelitis Electronic Signature(s) Signed: 07/12/2022 11:59:27 AM By: Allen Derry PA-C Entered By: Allen Derry on 07/12/2022 11:59:27 -------------------------------------------------------------------------------- Problem List Details Patient Name: Date of Service: [name], [name] 07/12/2022 12:00 PM Medical Record Number: [id number] Patient Account Number: [id number] Date of Birth/Sex: Treating RN: 06-09-1970 (51 y.o. 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

## 2022-07-13 DIAGNOSIS — N186 End stage renal disease: Secondary | ICD-10-CM | POA: Diagnosis not present

## 2022-07-13 DIAGNOSIS — Z992 Dependence on renal dialysis: Secondary | ICD-10-CM | POA: Diagnosis not present

## 2022-07-13 DIAGNOSIS — N2581 Secondary hyperparathyroidism of renal origin: Secondary | ICD-10-CM | POA: Diagnosis not present

## 2022-07-15 ENCOUNTER — Ambulatory Visit
Admission: RE | Admit: 2022-07-15 | Discharge: 2022-07-15 | Disposition: A | Discharge status: Home / Self Care | Payer: 59 | Source: Ambulatory Visit | Attending: Anesthesiology | Admitting: Anesthesiology

## 2022-07-15 ENCOUNTER — Other Ambulatory Visit: Payer: Self-pay | Admitting: Anesthesiology

## 2022-07-15 ENCOUNTER — Encounter: Payer: Self-pay | Admitting: Anesthesiology

## 2022-07-15 ENCOUNTER — Ambulatory Visit (HOSPITAL_BASED_OUTPATIENT_CLINIC_OR_DEPARTMENT_OTHER): Payer: 59 | Admitting: Anesthesiology

## 2022-07-15 VITALS — BP 98/58 | HR 57 | Temp 97.2°F | Resp 16 | Ht 64.0 in | Wt 264.0 lb

## 2022-07-15 DIAGNOSIS — I132 Hypertensive heart and chronic kidney disease with heart failure and with stage 5 chronic kidney disease, or end stage renal disease: Secondary | ICD-10-CM | POA: Diagnosis not present

## 2022-07-15 DIAGNOSIS — M5441 Lumbago with sciatica, right side: Secondary | ICD-10-CM

## 2022-07-15 DIAGNOSIS — Z7982 Long term (current) use of aspirin: Secondary | ICD-10-CM | POA: Diagnosis not present

## 2022-07-15 DIAGNOSIS — I5032 Chronic diastolic (congestive) heart failure: Secondary | ICD-10-CM | POA: Diagnosis not present

## 2022-07-15 DIAGNOSIS — N186 End stage renal disease: Secondary | ICD-10-CM | POA: Diagnosis not present

## 2022-07-15 DIAGNOSIS — M62830 Muscle spasm of back: Secondary | ICD-10-CM | POA: Diagnosis not present

## 2022-07-15 DIAGNOSIS — G8929 Other chronic pain: Secondary | ICD-10-CM | POA: Insufficient documentation

## 2022-07-15 DIAGNOSIS — G894 Chronic pain syndrome: Secondary | ICD-10-CM | POA: Diagnosis not present

## 2022-07-15 DIAGNOSIS — M48061 Spinal stenosis, lumbar region without neurogenic claudication: Secondary | ICD-10-CM | POA: Insufficient documentation

## 2022-07-15 DIAGNOSIS — E1151 Type 2 diabetes mellitus with diabetic peripheral angiopathy without gangrene: Secondary | ICD-10-CM | POA: Diagnosis not present

## 2022-07-15 DIAGNOSIS — L97513 Non-pressure chronic ulcer of other part of right foot with necrosis of muscle: Secondary | ICD-10-CM | POA: Diagnosis not present

## 2022-07-15 DIAGNOSIS — J9612 Chronic respiratory failure with hypercapnia: Secondary | ICD-10-CM | POA: Diagnosis not present

## 2022-07-15 DIAGNOSIS — I70203 Unspecified atherosclerosis of native arteries of extremities, bilateral legs: Secondary | ICD-10-CM | POA: Diagnosis not present

## 2022-07-15 DIAGNOSIS — I951 Orthostatic hypotension: Secondary | ICD-10-CM | POA: Diagnosis not present

## 2022-07-15 DIAGNOSIS — M86371 Chronic multifocal osteomyelitis, right ankle and foot: Secondary | ICD-10-CM | POA: Diagnosis not present

## 2022-07-15 DIAGNOSIS — D631 Anemia in chronic kidney disease: Secondary | ICD-10-CM | POA: Diagnosis not present

## 2022-07-15 DIAGNOSIS — Z7985 Long-term (current) use of injectable non-insulin antidiabetic drugs: Secondary | ICD-10-CM | POA: Diagnosis not present

## 2022-07-15 DIAGNOSIS — Z794 Long term (current) use of insulin: Secondary | ICD-10-CM | POA: Diagnosis not present

## 2022-07-15 DIAGNOSIS — J9611 Chronic respiratory failure with hypoxia: Secondary | ICD-10-CM | POA: Diagnosis not present

## 2022-07-15 DIAGNOSIS — Z992 Dependence on renal dialysis: Secondary | ICD-10-CM | POA: Diagnosis not present

## 2022-07-15 DIAGNOSIS — E1169 Type 2 diabetes mellitus with other specified complication: Secondary | ICD-10-CM | POA: Diagnosis not present

## 2022-07-15 DIAGNOSIS — J441 Chronic obstructive pulmonary disease with (acute) exacerbation: Secondary | ICD-10-CM | POA: Diagnosis not present

## 2022-07-15 DIAGNOSIS — E1122 Type 2 diabetes mellitus with diabetic chronic kidney disease: Secondary | ICD-10-CM | POA: Diagnosis not present

## 2022-07-15 DIAGNOSIS — E1165 Type 2 diabetes mellitus with hyperglycemia: Secondary | ICD-10-CM | POA: Diagnosis not present

## 2022-07-15 DIAGNOSIS — Z9981 Dependence on supplemental oxygen: Secondary | ICD-10-CM | POA: Diagnosis not present

## 2022-07-15 DIAGNOSIS — E11621 Type 2 diabetes mellitus with foot ulcer: Secondary | ICD-10-CM | POA: Diagnosis not present

## 2022-07-15 MED ORDER — METHOCARBAMOL 750 MG PO TABS: 750.0000 mg | ORAL_TABLET | Freq: Three times a day (TID) | ORAL | 1 refills | Status: AC | PRN

## 2022-07-15 MED ORDER — DEXAMETHASONE SODIUM PHOSPHATE 10 MG/ML IJ SOLN
10.0000 mg | Freq: Once | INTRAMUSCULAR | Status: AC
  Administered 2022-07-15: 10 mg

## 2022-07-15 MED ORDER — ROPIVACAINE HCL 2 MG/ML IJ SOLN
INTRAMUSCULAR | Status: AC
  Filled 2022-07-15: qty 20

## 2022-07-15 MED ORDER — ROPIVACAINE HCL 2 MG/ML IJ SOLN: 10.0000 mL | Freq: Once | INTRAMUSCULAR | Status: DC

## 2022-07-15 MED ORDER — DEXAMETHASONE SODIUM PHOSPHATE 10 MG/ML IJ SOLN
INTRAMUSCULAR | Status: AC
  Filled 2022-07-15: qty 1

## 2022-07-15 NOTE — Patient Instructions (Signed)

## 2022-07-15 NOTE — Progress Notes (Unsigned)
Nursing Pain Medication Assessment:  Safety precautions to be maintained throughout the outpatient stay will include: orient to surroundings, keep bed in low position, maintain call bell within reach at all times, provide assistance with transfer out of bed and ambulation.  

## 2022-07-16 ENCOUNTER — Encounter: Payer: Self-pay | Admitting: Anesthesiology

## 2022-07-16 DIAGNOSIS — N2581 Secondary hyperparathyroidism of renal origin: Secondary | ICD-10-CM | POA: Diagnosis not present

## 2022-07-16 DIAGNOSIS — M869 Osteomyelitis, unspecified: Secondary | ICD-10-CM | POA: Diagnosis not present

## 2022-07-16 DIAGNOSIS — Z992 Dependence on renal dialysis: Secondary | ICD-10-CM | POA: Diagnosis not present

## 2022-07-16 DIAGNOSIS — N186 End stage renal disease: Secondary | ICD-10-CM | POA: Diagnosis not present

## 2022-07-16 NOTE — Progress Notes (Signed)
Subjective:  Patient ID: [name], female    DOB: November 03, 1970  Age: 52 y.o. MRN: [id number]  CC: Back Pain (lower)   Procedure: Trigger point injection x 2 to the left lumbar region at L5 and L4 approximately 5 cm to the left of midline and 3 injections to the right of midline approximately 4 to 5 cm at L4 and L5.  HPI [name] presents for reevaluation.  [name] reports that she did very well with the recent trigger point injections that we did.  She had 2 weeks of nearly complete resolution of her low back pain.  She was feeling much better but has had some recurrence of a similar type pain over the course the last week or so.  No change in lower extremity strength function or bowel or bladder function is noted.  She did try the Ultram but felt that this was ineffective.  She is trying to do physical therapy stretching exercises as tolerated but these sometimes are difficult secondary to the pain.  She is not very active secondary to other underlying medical conditions.  Outpatient Medications Prior to Visit  Medication Sig Dispense Refill   acetaminophen (TYLENOL) 500 MG tablet Take 1,000 mg by mouth every 6 (six) hours as needed for mild pain, fever or headache.     albuterol (PROVENTIL) (2.5 MG/3ML) 0.083% nebulizer solution Take 3 mLs (2.5 mg total) by nebulization every 6 (six) hours as needed for wheezing or shortness of breath. 75 mL 11   albuterol (VENTOLIN HFA) 108 (90 Base) MCG/ACT inhaler Inhale 2 puffs into the lungs every 6 (six) hours as needed for wheezing or shortness of breath. 18 g 2   aspirin EC 81 MG tablet Take 81 mg by mouth daily.      atorvastatin (LIPITOR) 40 MG tablet Take 1 tablet (40 mg total) by mouth daily. 90 tablet 3   benzonatate (TESSALON PERLES) 100 MG capsule Take 1 capsule (100 mg total) by mouth 3 (three) times daily as needed for cough. 30 capsule 1   budesonide (PULMICORT) 0.5 MG/2ML nebulizer solution Take by nebulization.     clindamycin  (CLEOCIN T) 1 % lotion APPLY TO FACE, ARMS AND LEGS EVENLY NIGHTLY AT BEDTIME 180 mL 0   doxepin (SINEQUAN) 10 MG capsule Take 1 capsule (10 mg total) by mouth at bedtime. 30 capsule 0   escitalopram (LEXAPRO) 10 MG tablet Take 1 tablet (10 mg total) by mouth daily. 30 tablet 2   ferric citrate (AURYXIA) 1 GM 210 MG(Fe) tablet Take 210 mg by mouth 3 (three) times daily with meals. Only taking once a day most days     formoterol (PERFOROMIST) 20 MCG/2ML nebulizer solution      insulin degludec (TRESIBA) 100 UNIT/ML FlexTouch Pen Inject 20 Units into the skin daily.     insulin lispro (HUMALOG) 100 UNIT/ML KwikPen Inject into the skin.     lactulose (CHRONULAC) 10 GM/15ML solution Take 30 mLs (20 g total) by mouth daily as needed for severe constipation. 240 mL 0   lidocaine-prilocaine (EMLA) cream Apply topically as needed (For access for dialysis).     LINZESS 290 MCG CAPS capsule TAKE 1 CAPSULE BY MOUTH IN THE MORNING WITH  A  GLASS  OF  WATER  30  MINUTES  BEFORE  BREAKFAST  (FOR  CHRONIC  IDEOPATHIC  CONSTIPATION) 30 capsule 0   midodrine (PROAMATINE) 10 MG tablet Take 20 mg by mouth daily as needed.  montelukast (SINGULAIR) 10 MG tablet Take 1 tablet (10 mg total) by mouth at bedtime. 30 tablet 3   Omeprazole Magnesium (PRILOSEC PO)      OXYGEN Inhale 2-4 L into the lungs.     predniSONE (DELTASONE) 50 MG tablet Take 1 tablet by mouth daily in AM with food x 5 days 5 tablet 0   pregabalin (LYRICA) 50 MG capsule TAKE 1 CAPSULE BY MOUTH EVERY 8 HOURS AS NEEDED FOR  NEUROPATHY 90 capsule 0   RENVELA 2.4 g PACK Take 2.4 g by mouth 3 (three) times daily.     revefenacin (YUPELRI) 175 MCG/3ML nebulizer solution      Vitamin D, Cholecalciferol, 25 MCG (1000 UT) TABS Take 1,000 Units by mouth daily.     No facility-administered medications prior to visit.    Review of Systems CNS: No confusion or sedation Cardiac: No angina or palpitations GI: No abdominal pain or  constipation Constitutional: No nausea vomiting fevers or chills  Objective:  BP (!) 98/58   Pulse (!) 57   Temp (!) 97.2 F (36.2 C) (Temporal)   Resp 16   Ht 5\' 4"  (1.626 m)   Wt 264 lb (119.7 kg)   LMP 10/14/2012 (Approximate)   SpO2 100% Comment: O2 at 4 liters  BMI 45.32 kg/m    BP Readings from Last 3 Encounters:  07/15/22 (!) 98/58  06/10/22 108/65  05/22/22 110/80     Wt Readings from Last 3 Encounters:  07/15/22 264 lb (119.7 kg)  06/10/22 264 lb (119.7 kg)  05/22/22 266 lb 1.5 oz (120.7 kg)     Physical Exam Pt is alert and oriented PERRL EOMI HEART IS RRR no murmur or rub LCTA no wheezing or rales MUSCULOSKELETAL reveals 3 trigger points on the right 2 on the left in the lumbar region approximately 4 to 5 cm lateral to midline at L4 and L5.    Labs  Lab Results  Component Value Date   HGBA1C 7.3 (H) 04/16/2022   HGBA1C 6.2 (H) 04/22/2021   HGBA1C 8.9 (H) 10/15/2020   Lab Results  Component Value Date   LDLCALC 169 (H) 01/03/2015   CREATININE 8.07 (H) 04/20/2022    -------------------------------------------------------------------------------------------------------------------- Lab Results  Component Value Date   WBC 8.8 04/20/2022   HGB 11.1 (L) 04/20/2022   HCT 36.8 04/20/2022   PLT 227 04/20/2022   GLUCOSE 195 (H) 04/20/2022   CHOL 232 (H) 01/03/2015   TRIG 151 (H) 04/21/2021   HDL 41 01/03/2015   LDLCALC 169 (H) 01/03/2015   ALT 15 04/17/2022   AST 18 04/17/2022   NA 130 (L) 04/20/2022   K 4.6 04/20/2022   CL 92 (L) 04/20/2022   CREATININE 8.07 (H) 04/20/2022   BUN 61 (H) 04/20/2022   CO2 21 (L) 04/20/2022   TSH 2.831 11/08/2016   INR 1.2 04/15/2022   HGBA1C 7.3 (H) 04/16/2022    --------------------------------------------------------------------------------------------------------------------- No results found.   Assessment & Plan:   [name] was seen today for back pain.  Diagnoses and all orders for this  visit:  Chronic right-sided low back pain with right-sided sciatica -     dexamethasone (DECADRON) injection 10 mg -     ropivacaine (PF) 2 mg/mL (0.2%) (NAROPIN) injection 10 mL  Chronic pain syndrome  Spinal stenosis of lumbar region without neurogenic claudication  Muscle spasm of back -     dexamethasone (DECADRON) injection 10 mg -     ropivacaine (PF) 2 mg/mL (0.2%) (NAROPIN) injection 10  mL  Other orders -     methocarbamol (ROBAXIN-750) 750 MG tablet; Take 1 tablet (750 mg total) by mouth every 8 (eight) hours as needed for muscle spasms.        ----------------------------------------------------------------------------------------------------------------------  Problem List Items Addressed This Visit       Unprioritized   Chronic low back pain - Primary   Relevant Medications   ropivacaine (PF) 2 mg/mL (0.2%) (NAROPIN) injection 10 mL   methocarbamol (ROBAXIN-750) 750 MG tablet   Chronic pain syndrome   Relevant Medications   ropivacaine (PF) 2 mg/mL (0.2%) (NAROPIN) injection 10 mL   methocarbamol (ROBAXIN-750) 750 MG tablet   Other Visit Diagnoses     Spinal stenosis of lumbar region without neurogenic claudication       Muscle spasm of back       Relevant Medications   dexamethasone (DECADRON) injection 10 mg (Completed)   ropivacaine (PF) 2 mg/mL (0.2%) (NAROPIN) injection 10 mL         ----------------------------------------------------------------------------------------------------------------------  1. Chronic right-sided low back pain with right-sided sciatica We will proceed with repeat trigger point injection today per patient request.  I gone over the risk and benefits of the procedure with her in full detail.  She is inquiring as to whether we can do these on a weekly basis but secondary to the steroid and her underlying kidney dysfunction I have educated her about subsequent limitations.  Will proceed with repeat trigger point injections  today with return to clinic in about 1 month for possible repeat injections at that time.  I want her to continue with stretching exercises and physical therapy.  I offered this as an option for her for evaluation.  She will consider this.  Furthermore I want her to continue application of the TENS unit.  I will defer on lumbar epidural steroid injection at this time as the sciatica issue seems to be secondary. - dexamethasone (DECADRON) injection 10 mg - ropivacaine (PF) 2 mg/mL (0.2%) (NAROPIN) injection 10 mL  2. Chronic pain syndrome She has once again inquired whether opioid therapy would be appropriate but secondary to the multiple underlying medical conditions I think she is a poor candidate for this.  3. Spinal stenosis of lumbar region without neurogenic claudication As above  4. Muscle spasm of back As above - dexamethasone (DECADRON) injection 10 mg - ropivacaine (PF) 2 mg/mL (0.2%) (NAROPIN) injection 10 mL    ----------------------------------------------------------------------------------------------------------------------  I am having [name] start on methocarbamol. I am also having her maintain her acetaminophen, aspirin EC, lactulose, ferric citrate, lidocaine-prilocaine, OXYGEN, Yupelri, formoterol, budesonide, insulin lispro, Renvela, midodrine, montelukast, Omeprazole Magnesium (PRILOSEC PO), albuterol, albuterol, atorvastatin, insulin degludec, escitalopram, predniSONE, doxepin, Vitamin D (Cholecalciferol), benzonatate, Linzess, clindamycin, and pregabalin. We administered dexamethasone.   Meds ordered this encounter  Medications   dexamethasone (DECADRON) injection 10 mg   ropivacaine (PF) 2 mg/mL (0.2%) (NAROPIN) injection 10 mL   methocarbamol (ROBAXIN-750) 750 MG tablet    Sig: Take 1 tablet (750 mg total) by mouth every 8 (eight) hours as needed for muscle spasms.    Dispense:  90 tablet    Refill:  1   [name] Medications  New Prescriptions    METHOCARBAMOL (ROBAXIN-750) 750 MG TABLET    Take 1 tablet (750 mg total) by mouth every 8 (eight) hours as needed for muscle spasms.  Previous Medications   ACETAMINOPHEN (TYLENOL) 500 MG TABLET    Take 1,000 mg by mouth every 6 (six) hours as needed for  mild pain, fever or headache.   ALBUTEROL (PROVENTIL) (2.5 MG/3ML) 0.083% NEBULIZER SOLUTION    Take 3 mLs (2.5 mg total) by nebulization every 6 (six) hours as needed for wheezing or shortness of breath.   ALBUTEROL (VENTOLIN HFA) 108 (90 BASE) MCG/ACT INHALER    Inhale 2 puffs into the lungs every 6 (six) hours as needed for wheezing or shortness of breath.   ASPIRIN EC 81 MG TABLET    Take 81 mg by mouth daily.    ATORVASTATIN (LIPITOR) 40 MG TABLET    Take 1 tablet (40 mg total) by mouth daily.   BENZONATATE (TESSALON PERLES) 100 MG CAPSULE    Take 1 capsule (100 mg total) by mouth 3 (three) times daily as needed for cough.   BUDESONIDE (PULMICORT) 0.5 MG/2ML NEBULIZER SOLUTION    Take by nebulization.   CLINDAMYCIN (CLEOCIN T) 1 % LOTION    APPLY TO FACE, ARMS AND LEGS EVENLY NIGHTLY AT BEDTIME   DOXEPIN (SINEQUAN) 10 MG CAPSULE    Take 1 capsule (10 mg total) by mouth at bedtime.   ESCITALOPRAM (LEXAPRO) 10 MG TABLET    Take 1 tablet (10 mg total) by mouth daily.   FERRIC CITRATE (AURYXIA) 1 GM 210 MG(FE) TABLET    Take 210 mg by mouth 3 (three) times daily with meals. Only taking once a day most days   FORMOTEROL (PERFOROMIST) 20 MCG/2ML NEBULIZER SOLUTION       INSULIN DEGLUDEC (TRESIBA) 100 UNIT/ML FLEXTOUCH PEN    Inject 20 Units into the skin daily.   INSULIN LISPRO (HUMALOG) 100 UNIT/ML KWIKPEN    Inject into the skin.   LACTULOSE (CHRONULAC) 10 GM/15ML SOLUTION    Take 30 mLs (20 g total) by mouth daily as needed for severe constipation.   LIDOCAINE-PRILOCAINE (EMLA) CREAM    Apply topically as needed (For access for dialysis).   LINZESS 290 MCG CAPS CAPSULE    TAKE 1 CAPSULE BY MOUTH IN THE MORNING WITH  A  GLASS  OF  WATER  30   MINUTES  BEFORE  BREAKFAST  (FOR  CHRONIC  IDEOPATHIC  CONSTIPATION)   MIDODRINE (PROAMATINE) 10 MG TABLET    Take 20 mg by mouth daily as needed.   MONTELUKAST (SINGULAIR) 10 MG TABLET    Take 1 tablet (10 mg total) by mouth at bedtime.   OMEPRAZOLE MAGNESIUM (PRILOSEC PO)       OXYGEN    Inhale 2-4 L into the lungs.   PREDNISONE (DELTASONE) 50 MG TABLET    Take 1 tablet by mouth daily in AM with food x 5 days   PREGABALIN (LYRICA) 50 MG CAPSULE    TAKE 1 CAPSULE BY MOUTH EVERY 8 HOURS AS NEEDED FOR  NEUROPATHY   RENVELA 2.4 G PACK    Take 2.4 g by mouth 3 (three) times daily.   REVEFENACIN (YUPELRI) 175 MCG/3ML NEBULIZER SOLUTION       VITAMIN D, CHOLECALCIFEROL, 25 MCG (1000 UT) TABS    Take 1,000 Units by mouth daily.  Modified Medications   No medications on file  Discontinued Medications   No medications on file   ----------------------------------------------------------------------------------------------------------------------  Follow-up: Return in about 3 weeks (around 08/05/2022) for tpi IN 3-4 WEEKS.  Trigger point injection: The area overlying the aforementioned trigger points were prepped with alcohol. They were then injected with a 25-gauge needle with 2 cc of ropivacaine 0.2% and Decadron 2 mg at each site after negative aspiration for heme. This was performed  after informed consent was obtained and risks and benefits reviewed. She tolerated this procedure without difficulty and was convalesced and discharged to home in stable condition for follow-up as mentioned.  @Hope Holst  Pernell Dupre, MD@  Yevette Edwards, MD

## 2022-07-17 DIAGNOSIS — I871 Compression of vein: Secondary | ICD-10-CM | POA: Diagnosis not present

## 2022-07-17 DIAGNOSIS — T82858A Stenosis of vascular prosthetic devices, implants and grafts, initial encounter: Secondary | ICD-10-CM | POA: Diagnosis not present

## 2022-07-17 DIAGNOSIS — T82898A Other specified complication of vascular prosthetic devices, implants and grafts, initial encounter: Secondary | ICD-10-CM | POA: Diagnosis not present

## 2022-07-17 DIAGNOSIS — T82848A Pain from vascular prosthetic devices, implants and grafts, initial encounter: Secondary | ICD-10-CM | POA: Diagnosis not present

## 2022-07-18 DIAGNOSIS — N186 End stage renal disease: Secondary | ICD-10-CM | POA: Diagnosis not present

## 2022-07-18 DIAGNOSIS — Z992 Dependence on renal dialysis: Secondary | ICD-10-CM | POA: Diagnosis not present

## 2022-07-18 DIAGNOSIS — N2581 Secondary hyperparathyroidism of renal origin: Secondary | ICD-10-CM | POA: Diagnosis not present

## 2022-07-19 DIAGNOSIS — N186 End stage renal disease: Secondary | ICD-10-CM | POA: Diagnosis not present

## 2022-07-19 DIAGNOSIS — E11621 Type 2 diabetes mellitus with foot ulcer: Secondary | ICD-10-CM | POA: Diagnosis not present

## 2022-07-19 DIAGNOSIS — Z7985 Long-term (current) use of injectable non-insulin antidiabetic drugs: Secondary | ICD-10-CM | POA: Diagnosis not present

## 2022-07-19 DIAGNOSIS — J9612 Chronic respiratory failure with hypercapnia: Secondary | ICD-10-CM | POA: Diagnosis not present

## 2022-07-19 DIAGNOSIS — Z7982 Long term (current) use of aspirin: Secondary | ICD-10-CM | POA: Diagnosis not present

## 2022-07-19 DIAGNOSIS — M86371 Chronic multifocal osteomyelitis, right ankle and foot: Secondary | ICD-10-CM | POA: Diagnosis not present

## 2022-07-19 DIAGNOSIS — J441 Chronic obstructive pulmonary disease with (acute) exacerbation: Secondary | ICD-10-CM | POA: Diagnosis not present

## 2022-07-19 DIAGNOSIS — I5032 Chronic diastolic (congestive) heart failure: Secondary | ICD-10-CM | POA: Diagnosis not present

## 2022-07-19 DIAGNOSIS — Z794 Long term (current) use of insulin: Secondary | ICD-10-CM | POA: Diagnosis not present

## 2022-07-19 DIAGNOSIS — Z9981 Dependence on supplemental oxygen: Secondary | ICD-10-CM | POA: Diagnosis not present

## 2022-07-19 DIAGNOSIS — I70203 Unspecified atherosclerosis of native arteries of extremities, bilateral legs: Secondary | ICD-10-CM | POA: Diagnosis not present

## 2022-07-19 DIAGNOSIS — I132 Hypertensive heart and chronic kidney disease with heart failure and with stage 5 chronic kidney disease, or end stage renal disease: Secondary | ICD-10-CM | POA: Diagnosis not present

## 2022-07-19 DIAGNOSIS — E1151 Type 2 diabetes mellitus with diabetic peripheral angiopathy without gangrene: Secondary | ICD-10-CM | POA: Diagnosis not present

## 2022-07-19 DIAGNOSIS — J9611 Chronic respiratory failure with hypoxia: Secondary | ICD-10-CM | POA: Diagnosis not present

## 2022-07-19 DIAGNOSIS — E1165 Type 2 diabetes mellitus with hyperglycemia: Secondary | ICD-10-CM | POA: Diagnosis not present

## 2022-07-19 DIAGNOSIS — I951 Orthostatic hypotension: Secondary | ICD-10-CM | POA: Diagnosis not present

## 2022-07-19 DIAGNOSIS — Z992 Dependence on renal dialysis: Secondary | ICD-10-CM | POA: Diagnosis not present

## 2022-07-19 DIAGNOSIS — E1169 Type 2 diabetes mellitus with other specified complication: Secondary | ICD-10-CM | POA: Diagnosis not present

## 2022-07-19 DIAGNOSIS — E1122 Type 2 diabetes mellitus with diabetic chronic kidney disease: Secondary | ICD-10-CM | POA: Diagnosis not present

## 2022-07-19 DIAGNOSIS — D631 Anemia in chronic kidney disease: Secondary | ICD-10-CM | POA: Diagnosis not present

## 2022-07-19 DIAGNOSIS — L97513 Non-pressure chronic ulcer of other part of right foot with necrosis of muscle: Secondary | ICD-10-CM | POA: Diagnosis not present

## 2022-07-20 DIAGNOSIS — N2581 Secondary hyperparathyroidism of renal origin: Secondary | ICD-10-CM | POA: Diagnosis not present

## 2022-07-20 DIAGNOSIS — Z992 Dependence on renal dialysis: Secondary | ICD-10-CM | POA: Diagnosis not present

## 2022-07-20 DIAGNOSIS — N186 End stage renal disease: Secondary | ICD-10-CM | POA: Diagnosis not present

## 2022-07-22 DIAGNOSIS — Z7985 Long-term (current) use of injectable non-insulin antidiabetic drugs: Secondary | ICD-10-CM | POA: Diagnosis not present

## 2022-07-22 DIAGNOSIS — N186 End stage renal disease: Secondary | ICD-10-CM | POA: Diagnosis not present

## 2022-07-22 DIAGNOSIS — E1151 Type 2 diabetes mellitus with diabetic peripheral angiopathy without gangrene: Secondary | ICD-10-CM | POA: Diagnosis not present

## 2022-07-22 DIAGNOSIS — Z992 Dependence on renal dialysis: Secondary | ICD-10-CM | POA: Diagnosis not present

## 2022-07-22 DIAGNOSIS — Z9981 Dependence on supplemental oxygen: Secondary | ICD-10-CM | POA: Diagnosis not present

## 2022-07-22 DIAGNOSIS — I132 Hypertensive heart and chronic kidney disease with heart failure and with stage 5 chronic kidney disease, or end stage renal disease: Secondary | ICD-10-CM | POA: Diagnosis not present

## 2022-07-22 DIAGNOSIS — I5032 Chronic diastolic (congestive) heart failure: Secondary | ICD-10-CM | POA: Diagnosis not present

## 2022-07-22 DIAGNOSIS — L97513 Non-pressure chronic ulcer of other part of right foot with necrosis of muscle: Secondary | ICD-10-CM | POA: Diagnosis not present

## 2022-07-22 DIAGNOSIS — I70203 Unspecified atherosclerosis of native arteries of extremities, bilateral legs: Secondary | ICD-10-CM | POA: Diagnosis not present

## 2022-07-22 DIAGNOSIS — Z7982 Long term (current) use of aspirin: Secondary | ICD-10-CM | POA: Diagnosis not present

## 2022-07-22 DIAGNOSIS — E1122 Type 2 diabetes mellitus with diabetic chronic kidney disease: Secondary | ICD-10-CM | POA: Diagnosis not present

## 2022-07-22 DIAGNOSIS — E11621 Type 2 diabetes mellitus with foot ulcer: Secondary | ICD-10-CM | POA: Diagnosis not present

## 2022-07-22 DIAGNOSIS — Z794 Long term (current) use of insulin: Secondary | ICD-10-CM | POA: Diagnosis not present

## 2022-07-22 DIAGNOSIS — J9612 Chronic respiratory failure with hypercapnia: Secondary | ICD-10-CM | POA: Diagnosis not present

## 2022-07-22 DIAGNOSIS — J441 Chronic obstructive pulmonary disease with (acute) exacerbation: Secondary | ICD-10-CM | POA: Diagnosis not present

## 2022-07-22 DIAGNOSIS — M86371 Chronic multifocal osteomyelitis, right ankle and foot: Secondary | ICD-10-CM | POA: Diagnosis not present

## 2022-07-22 DIAGNOSIS — D631 Anemia in chronic kidney disease: Secondary | ICD-10-CM | POA: Diagnosis not present

## 2022-07-22 DIAGNOSIS — E1165 Type 2 diabetes mellitus with hyperglycemia: Secondary | ICD-10-CM | POA: Diagnosis not present

## 2022-07-22 DIAGNOSIS — I951 Orthostatic hypotension: Secondary | ICD-10-CM | POA: Diagnosis not present

## 2022-07-22 DIAGNOSIS — E1169 Type 2 diabetes mellitus with other specified complication: Secondary | ICD-10-CM | POA: Diagnosis not present

## 2022-07-22 DIAGNOSIS — J9611 Chronic respiratory failure with hypoxia: Secondary | ICD-10-CM | POA: Diagnosis not present

## 2022-07-23 DIAGNOSIS — N186 End stage renal disease: Secondary | ICD-10-CM | POA: Diagnosis not present

## 2022-07-23 DIAGNOSIS — Z992 Dependence on renal dialysis: Secondary | ICD-10-CM | POA: Diagnosis not present

## 2022-07-23 DIAGNOSIS — N2581 Secondary hyperparathyroidism of renal origin: Secondary | ICD-10-CM | POA: Diagnosis not present

## 2022-07-23 DIAGNOSIS — M869 Osteomyelitis, unspecified: Secondary | ICD-10-CM | POA: Diagnosis not present

## 2022-07-25 DIAGNOSIS — N2581 Secondary hyperparathyroidism of renal origin: Secondary | ICD-10-CM | POA: Diagnosis not present

## 2022-07-25 DIAGNOSIS — N186 End stage renal disease: Secondary | ICD-10-CM | POA: Diagnosis not present

## 2022-07-25 DIAGNOSIS — Z992 Dependence on renal dialysis: Secondary | ICD-10-CM | POA: Diagnosis not present

## 2022-07-26 DIAGNOSIS — E1165 Type 2 diabetes mellitus with hyperglycemia: Secondary | ICD-10-CM | POA: Diagnosis not present

## 2022-07-26 DIAGNOSIS — N186 End stage renal disease: Secondary | ICD-10-CM | POA: Diagnosis not present

## 2022-07-26 DIAGNOSIS — Z9981 Dependence on supplemental oxygen: Secondary | ICD-10-CM | POA: Diagnosis not present

## 2022-07-26 DIAGNOSIS — M86371 Chronic multifocal osteomyelitis, right ankle and foot: Secondary | ICD-10-CM | POA: Diagnosis not present

## 2022-07-26 DIAGNOSIS — L97513 Non-pressure chronic ulcer of other part of right foot with necrosis of muscle: Secondary | ICD-10-CM | POA: Diagnosis not present

## 2022-07-26 DIAGNOSIS — Z992 Dependence on renal dialysis: Secondary | ICD-10-CM | POA: Diagnosis not present

## 2022-07-26 DIAGNOSIS — I951 Orthostatic hypotension: Secondary | ICD-10-CM | POA: Diagnosis not present

## 2022-07-26 DIAGNOSIS — E11621 Type 2 diabetes mellitus with foot ulcer: Secondary | ICD-10-CM | POA: Diagnosis not present

## 2022-07-26 DIAGNOSIS — D631 Anemia in chronic kidney disease: Secondary | ICD-10-CM | POA: Diagnosis not present

## 2022-07-26 DIAGNOSIS — I70203 Unspecified atherosclerosis of native arteries of extremities, bilateral legs: Secondary | ICD-10-CM | POA: Diagnosis not present

## 2022-07-26 DIAGNOSIS — I132 Hypertensive heart and chronic kidney disease with heart failure and with stage 5 chronic kidney disease, or end stage renal disease: Secondary | ICD-10-CM | POA: Diagnosis not present

## 2022-07-26 DIAGNOSIS — J9612 Chronic respiratory failure with hypercapnia: Secondary | ICD-10-CM | POA: Diagnosis not present

## 2022-07-26 DIAGNOSIS — E1122 Type 2 diabetes mellitus with diabetic chronic kidney disease: Secondary | ICD-10-CM | POA: Diagnosis not present

## 2022-07-26 DIAGNOSIS — J441 Chronic obstructive pulmonary disease with (acute) exacerbation: Secondary | ICD-10-CM | POA: Diagnosis not present

## 2022-07-26 DIAGNOSIS — E1151 Type 2 diabetes mellitus with diabetic peripheral angiopathy without gangrene: Secondary | ICD-10-CM | POA: Diagnosis not present

## 2022-07-26 DIAGNOSIS — J9611 Chronic respiratory failure with hypoxia: Secondary | ICD-10-CM | POA: Diagnosis not present

## 2022-07-26 DIAGNOSIS — Z7985 Long-term (current) use of injectable non-insulin antidiabetic drugs: Secondary | ICD-10-CM | POA: Diagnosis not present

## 2022-07-26 DIAGNOSIS — Z794 Long term (current) use of insulin: Secondary | ICD-10-CM | POA: Diagnosis not present

## 2022-07-26 DIAGNOSIS — Z7982 Long term (current) use of aspirin: Secondary | ICD-10-CM | POA: Diagnosis not present

## 2022-07-26 DIAGNOSIS — E1169 Type 2 diabetes mellitus with other specified complication: Secondary | ICD-10-CM | POA: Diagnosis not present

## 2022-07-26 DIAGNOSIS — I5032 Chronic diastolic (congestive) heart failure: Secondary | ICD-10-CM | POA: Diagnosis not present

## 2022-07-27 DIAGNOSIS — J45909 Unspecified asthma, uncomplicated: Secondary | ICD-10-CM | POA: Diagnosis not present

## 2022-07-27 DIAGNOSIS — N186 End stage renal disease: Secondary | ICD-10-CM | POA: Diagnosis not present

## 2022-07-27 DIAGNOSIS — Z992 Dependence on renal dialysis: Secondary | ICD-10-CM | POA: Diagnosis not present

## 2022-07-27 DIAGNOSIS — N2581 Secondary hyperparathyroidism of renal origin: Secondary | ICD-10-CM | POA: Diagnosis not present

## 2022-07-27 DIAGNOSIS — I5040 Unspecified combined systolic (congestive) and diastolic (congestive) heart failure: Secondary | ICD-10-CM | POA: Diagnosis not present

## 2022-07-29 DIAGNOSIS — E1151 Type 2 diabetes mellitus with diabetic peripheral angiopathy without gangrene: Secondary | ICD-10-CM | POA: Diagnosis not present

## 2022-07-29 DIAGNOSIS — J441 Chronic obstructive pulmonary disease with (acute) exacerbation: Secondary | ICD-10-CM | POA: Diagnosis not present

## 2022-07-29 DIAGNOSIS — N186 End stage renal disease: Secondary | ICD-10-CM | POA: Diagnosis not present

## 2022-07-29 DIAGNOSIS — Z7982 Long term (current) use of aspirin: Secondary | ICD-10-CM | POA: Diagnosis not present

## 2022-07-29 DIAGNOSIS — J9611 Chronic respiratory failure with hypoxia: Secondary | ICD-10-CM | POA: Diagnosis not present

## 2022-07-29 DIAGNOSIS — I132 Hypertensive heart and chronic kidney disease with heart failure and with stage 5 chronic kidney disease, or end stage renal disease: Secondary | ICD-10-CM | POA: Diagnosis not present

## 2022-07-29 DIAGNOSIS — M86371 Chronic multifocal osteomyelitis, right ankle and foot: Secondary | ICD-10-CM | POA: Diagnosis not present

## 2022-07-29 DIAGNOSIS — Z9981 Dependence on supplemental oxygen: Secondary | ICD-10-CM | POA: Diagnosis not present

## 2022-07-29 DIAGNOSIS — L97513 Non-pressure chronic ulcer of other part of right foot with necrosis of muscle: Secondary | ICD-10-CM | POA: Diagnosis not present

## 2022-07-29 DIAGNOSIS — D631 Anemia in chronic kidney disease: Secondary | ICD-10-CM | POA: Diagnosis not present

## 2022-07-29 DIAGNOSIS — I951 Orthostatic hypotension: Secondary | ICD-10-CM | POA: Diagnosis not present

## 2022-07-29 DIAGNOSIS — I70203 Unspecified atherosclerosis of native arteries of extremities, bilateral legs: Secondary | ICD-10-CM | POA: Diagnosis not present

## 2022-07-29 DIAGNOSIS — E11621 Type 2 diabetes mellitus with foot ulcer: Secondary | ICD-10-CM | POA: Diagnosis not present

## 2022-07-29 DIAGNOSIS — Z7985 Long-term (current) use of injectable non-insulin antidiabetic drugs: Secondary | ICD-10-CM | POA: Diagnosis not present

## 2022-07-29 DIAGNOSIS — E1165 Type 2 diabetes mellitus with hyperglycemia: Secondary | ICD-10-CM | POA: Diagnosis not present

## 2022-07-29 DIAGNOSIS — Z794 Long term (current) use of insulin: Secondary | ICD-10-CM | POA: Diagnosis not present

## 2022-07-29 DIAGNOSIS — J9612 Chronic respiratory failure with hypercapnia: Secondary | ICD-10-CM | POA: Diagnosis not present

## 2022-07-29 DIAGNOSIS — I5032 Chronic diastolic (congestive) heart failure: Secondary | ICD-10-CM | POA: Diagnosis not present

## 2022-07-29 DIAGNOSIS — E1169 Type 2 diabetes mellitus with other specified complication: Secondary | ICD-10-CM | POA: Diagnosis not present

## 2022-07-29 DIAGNOSIS — E1122 Type 2 diabetes mellitus with diabetic chronic kidney disease: Secondary | ICD-10-CM | POA: Diagnosis not present

## 2022-07-29 DIAGNOSIS — Z992 Dependence on renal dialysis: Secondary | ICD-10-CM | POA: Diagnosis not present

## 2022-07-30 DIAGNOSIS — Z992 Dependence on renal dialysis: Secondary | ICD-10-CM | POA: Diagnosis not present

## 2022-07-30 DIAGNOSIS — N2581 Secondary hyperparathyroidism of renal origin: Secondary | ICD-10-CM | POA: Diagnosis not present

## 2022-07-30 DIAGNOSIS — M869 Osteomyelitis, unspecified: Secondary | ICD-10-CM | POA: Diagnosis not present

## 2022-07-30 DIAGNOSIS — N186 End stage renal disease: Secondary | ICD-10-CM | POA: Diagnosis not present

## 2022-08-01 DIAGNOSIS — N186 End stage renal disease: Secondary | ICD-10-CM | POA: Diagnosis not present

## 2022-08-01 DIAGNOSIS — Z992 Dependence on renal dialysis: Secondary | ICD-10-CM | POA: Diagnosis not present

## 2022-08-01 DIAGNOSIS — N2581 Secondary hyperparathyroidism of renal origin: Secondary | ICD-10-CM | POA: Diagnosis not present

## 2022-08-02 ENCOUNTER — Encounter: Payer: 59 | Attending: Physician Assistant | Admitting: Physician Assistant

## 2022-08-02 DIAGNOSIS — M86371 Chronic multifocal osteomyelitis, right ankle and foot: Secondary | ICD-10-CM | POA: Insufficient documentation

## 2022-08-02 DIAGNOSIS — E1169 Type 2 diabetes mellitus with other specified complication: Secondary | ICD-10-CM | POA: Insufficient documentation

## 2022-08-02 DIAGNOSIS — E1122 Type 2 diabetes mellitus with diabetic chronic kidney disease: Secondary | ICD-10-CM | POA: Diagnosis not present

## 2022-08-02 DIAGNOSIS — I132 Hypertensive heart and chronic kidney disease with heart failure and with stage 5 chronic kidney disease, or end stage renal disease: Secondary | ICD-10-CM | POA: Insufficient documentation

## 2022-08-02 DIAGNOSIS — I5042 Chronic combined systolic (congestive) and diastolic (congestive) heart failure: Secondary | ICD-10-CM | POA: Diagnosis not present

## 2022-08-02 DIAGNOSIS — Z992 Dependence on renal dialysis: Secondary | ICD-10-CM | POA: Insufficient documentation

## 2022-08-02 DIAGNOSIS — N186 End stage renal disease: Secondary | ICD-10-CM | POA: Insufficient documentation

## 2022-08-02 DIAGNOSIS — L97514 Non-pressure chronic ulcer of other part of right foot with necrosis of bone: Secondary | ICD-10-CM | POA: Diagnosis not present

## 2022-08-02 DIAGNOSIS — I1A0 Resistant hypertension: Secondary | ICD-10-CM | POA: Insufficient documentation

## 2022-08-02 DIAGNOSIS — I5040 Unspecified combined systolic (congestive) and diastolic (congestive) heart failure: Secondary | ICD-10-CM | POA: Insufficient documentation

## 2022-08-02 DIAGNOSIS — E11621 Type 2 diabetes mellitus with foot ulcer: Secondary | ICD-10-CM | POA: Diagnosis not present

## 2022-08-05 ENCOUNTER — Telehealth: Payer: Self-pay

## 2022-08-05 ENCOUNTER — Telehealth: Payer: Self-pay | Admitting: Nurse Practitioner

## 2022-08-05 DIAGNOSIS — I951 Orthostatic hypotension: Secondary | ICD-10-CM | POA: Diagnosis not present

## 2022-08-05 DIAGNOSIS — J441 Chronic obstructive pulmonary disease with (acute) exacerbation: Secondary | ICD-10-CM | POA: Diagnosis not present

## 2022-08-05 DIAGNOSIS — I5032 Chronic diastolic (congestive) heart failure: Secondary | ICD-10-CM | POA: Diagnosis not present

## 2022-08-05 DIAGNOSIS — Z794 Long term (current) use of insulin: Secondary | ICD-10-CM | POA: Diagnosis not present

## 2022-08-05 DIAGNOSIS — Z7982 Long term (current) use of aspirin: Secondary | ICD-10-CM | POA: Diagnosis not present

## 2022-08-05 DIAGNOSIS — E1151 Type 2 diabetes mellitus with diabetic peripheral angiopathy without gangrene: Secondary | ICD-10-CM | POA: Diagnosis not present

## 2022-08-05 DIAGNOSIS — Z9981 Dependence on supplemental oxygen: Secondary | ICD-10-CM | POA: Diagnosis not present

## 2022-08-05 DIAGNOSIS — E1122 Type 2 diabetes mellitus with diabetic chronic kidney disease: Secondary | ICD-10-CM | POA: Diagnosis not present

## 2022-08-05 DIAGNOSIS — J9612 Chronic respiratory failure with hypercapnia: Secondary | ICD-10-CM | POA: Diagnosis not present

## 2022-08-05 DIAGNOSIS — E1165 Type 2 diabetes mellitus with hyperglycemia: Secondary | ICD-10-CM | POA: Diagnosis not present

## 2022-08-05 DIAGNOSIS — N186 End stage renal disease: Secondary | ICD-10-CM | POA: Diagnosis not present

## 2022-08-05 DIAGNOSIS — Z992 Dependence on renal dialysis: Secondary | ICD-10-CM | POA: Diagnosis not present

## 2022-08-05 DIAGNOSIS — J9611 Chronic respiratory failure with hypoxia: Secondary | ICD-10-CM | POA: Diagnosis not present

## 2022-08-05 DIAGNOSIS — Z7985 Long-term (current) use of injectable non-insulin antidiabetic drugs: Secondary | ICD-10-CM | POA: Diagnosis not present

## 2022-08-05 DIAGNOSIS — M86371 Chronic multifocal osteomyelitis, right ankle and foot: Secondary | ICD-10-CM | POA: Diagnosis not present

## 2022-08-05 DIAGNOSIS — L97513 Non-pressure chronic ulcer of other part of right foot with necrosis of muscle: Secondary | ICD-10-CM | POA: Diagnosis not present

## 2022-08-05 DIAGNOSIS — I132 Hypertensive heart and chronic kidney disease with heart failure and with stage 5 chronic kidney disease, or end stage renal disease: Secondary | ICD-10-CM | POA: Diagnosis not present

## 2022-08-05 DIAGNOSIS — I70203 Unspecified atherosclerosis of native arteries of extremities, bilateral legs: Secondary | ICD-10-CM | POA: Diagnosis not present

## 2022-08-05 DIAGNOSIS — D631 Anemia in chronic kidney disease: Secondary | ICD-10-CM | POA: Diagnosis not present

## 2022-08-05 DIAGNOSIS — E1169 Type 2 diabetes mellitus with other specified complication: Secondary | ICD-10-CM | POA: Diagnosis not present

## 2022-08-05 DIAGNOSIS — E11621 Type 2 diabetes mellitus with foot ulcer: Secondary | ICD-10-CM | POA: Diagnosis not present

## 2022-08-05 NOTE — Telephone Encounter (Signed)
Patient is having bladder pressure and believes she may have a UTI. She is on dialysis and does not produce urine anymore. Can we send her in an antibiotic?   [facility]

## 2022-08-05 NOTE — Telephone Encounter (Signed)
Nadine with [facility] called stating that patients in pain today and its a 7/10 which is outside of the normal range they just to let you know

## 2022-08-06 DIAGNOSIS — M869 Osteomyelitis, unspecified: Secondary | ICD-10-CM | POA: Diagnosis not present

## 2022-08-06 DIAGNOSIS — Z992 Dependence on renal dialysis: Secondary | ICD-10-CM | POA: Diagnosis not present

## 2022-08-06 DIAGNOSIS — N2581 Secondary hyperparathyroidism of renal origin: Secondary | ICD-10-CM | POA: Diagnosis not present

## 2022-08-06 DIAGNOSIS — N186 End stage renal disease: Secondary | ICD-10-CM | POA: Diagnosis not present

## 2022-08-07 DIAGNOSIS — M86371 Chronic multifocal osteomyelitis, right ankle and foot: Secondary | ICD-10-CM | POA: Diagnosis not present

## 2022-08-07 DIAGNOSIS — J9611 Chronic respiratory failure with hypoxia: Secondary | ICD-10-CM | POA: Diagnosis not present

## 2022-08-07 DIAGNOSIS — I70203 Unspecified atherosclerosis of native arteries of extremities, bilateral legs: Secondary | ICD-10-CM | POA: Diagnosis not present

## 2022-08-07 DIAGNOSIS — E1151 Type 2 diabetes mellitus with diabetic peripheral angiopathy without gangrene: Secondary | ICD-10-CM | POA: Diagnosis not present

## 2022-08-07 DIAGNOSIS — Z7985 Long-term (current) use of injectable non-insulin antidiabetic drugs: Secondary | ICD-10-CM | POA: Diagnosis not present

## 2022-08-07 DIAGNOSIS — I132 Hypertensive heart and chronic kidney disease with heart failure and with stage 5 chronic kidney disease, or end stage renal disease: Secondary | ICD-10-CM | POA: Diagnosis not present

## 2022-08-07 DIAGNOSIS — Z794 Long term (current) use of insulin: Secondary | ICD-10-CM | POA: Diagnosis not present

## 2022-08-07 DIAGNOSIS — N186 End stage renal disease: Secondary | ICD-10-CM | POA: Diagnosis not present

## 2022-08-07 DIAGNOSIS — E1169 Type 2 diabetes mellitus with other specified complication: Secondary | ICD-10-CM | POA: Diagnosis not present

## 2022-08-07 DIAGNOSIS — Z992 Dependence on renal dialysis: Secondary | ICD-10-CM | POA: Diagnosis not present

## 2022-08-07 DIAGNOSIS — J441 Chronic obstructive pulmonary disease with (acute) exacerbation: Secondary | ICD-10-CM | POA: Diagnosis not present

## 2022-08-07 DIAGNOSIS — E1165 Type 2 diabetes mellitus with hyperglycemia: Secondary | ICD-10-CM | POA: Diagnosis not present

## 2022-08-07 DIAGNOSIS — Z9981 Dependence on supplemental oxygen: Secondary | ICD-10-CM | POA: Diagnosis not present

## 2022-08-07 DIAGNOSIS — E1122 Type 2 diabetes mellitus with diabetic chronic kidney disease: Secondary | ICD-10-CM | POA: Diagnosis not present

## 2022-08-07 DIAGNOSIS — J9612 Chronic respiratory failure with hypercapnia: Secondary | ICD-10-CM | POA: Diagnosis not present

## 2022-08-07 DIAGNOSIS — L97513 Non-pressure chronic ulcer of other part of right foot with necrosis of muscle: Secondary | ICD-10-CM | POA: Diagnosis not present

## 2022-08-07 DIAGNOSIS — Z7982 Long term (current) use of aspirin: Secondary | ICD-10-CM | POA: Diagnosis not present

## 2022-08-07 DIAGNOSIS — I951 Orthostatic hypotension: Secondary | ICD-10-CM | POA: Diagnosis not present

## 2022-08-07 DIAGNOSIS — E11621 Type 2 diabetes mellitus with foot ulcer: Secondary | ICD-10-CM | POA: Diagnosis not present

## 2022-08-07 DIAGNOSIS — I5032 Chronic diastolic (congestive) heart failure: Secondary | ICD-10-CM | POA: Diagnosis not present

## 2022-08-07 DIAGNOSIS — D631 Anemia in chronic kidney disease: Secondary | ICD-10-CM | POA: Diagnosis not present

## 2022-08-07 NOTE — Progress Notes (Addendum)
Sollers, [name] ([id number]) [id number] Page 1 of 11 Visit Report for 08/02/2022 Chief Complaint Document Details Patient Name: Date of Service: [name], [name] 08/02/2022 12:00 PM Medical Record Number: [id number] Patient Account Number: [id number] Date of Birth/Sex: Treating RN: 10-30-1970 (52 y.o. [name] Primary Care Provider: Orson Eva Other Clinician: Betha Loa Referring Provider: Treating Provider/Extender: Samara Snide Weeks in Treatment: 36 Information Obtained from: Patient Chief Complaint Right foot gangrene and osteomyelitis Electronic Signature(s) Signed: 08/02/2022 12:07:48 PM By: Allen Derry PA-C Entered By: Allen Derry on 08/02/2022 12:07:48 -------------------------------------------------------------------------------- Debridement Details Patient Name: Date of Service: [name], [name] 08/02/2022 12:00 PM Medical Record Number: [id number] Patient Account Number: [id number] Date of Birth/Sex: Treating RN: 30-Mar-1970 (52 y.o. [name] Primary Care Provider: Orson Eva Other Clinician: Betha Loa Referring Provider: Treating Provider/Extender: Samara Snide Weeks in Treatment: 63 Debridement Performed for Assessment: Wound #2 Right,Circumferential Foot Performed By: Physician Allen Derry, PA-C Debridement Type: Debridement Severity of Tissue Pre Debridement: Necrosis of bone Level of Consciousness (Pre-procedure): Awake and Alert Pre-procedure Verification/Time Out Yes - 12:45 Taken: Start Time: 12:45 Percent of Wound Bed Debrided: 50% T Area Debrided (cm): otal 33.85 Tissue and other material debrided: Viable, Non-Viable, Muscle, Slough, Slough Level: Skin/Subcutaneous Tissue/Muscle Debridement Description: Excisional Instrument: Curette Bleeding: Minimum Hemostasis Achieved: Pressure Response to Treatment: Procedure was tolerated well Level of Consciousness  (Post- Awake and Alert procedure): [name], [name] ([id number]) [id number] Page 2 of 11 Post Debridement Measurements of Total Wound Length: (cm) 11.5 Width: (cm) 7.5 Depth: (cm) 0.3 Volume: (cm) 20.322 Character of Wound/Ulcer Post Debridement: Stable Severity of Tissue Post Debridement: Necrosis of bone Post Procedure Diagnosis Same as Pre-procedure Electronic Signature(s) Signed: 08/02/2022 1:19:53 PM By: Betha Loa Signed: 08/05/2022 4:07:36 PM By: Angelina Pih Signed: 08/06/2022 6:26:43 PM By: Allen Derry PA-C Entered By: Betha Loa on 08/02/2022 13:09:29 -------------------------------------------------------------------------------- HPI Details Patient Name: Date of Service: [name], [name] 08/02/2022 12:00 PM Medical Record Number: [id number] Patient Account Number: [id number] Date of Birth/Sex: Treating RN: [birth year] (52 y.o. [name] Primary Care Provider: Orson Eva Other Clinician: Betha Loa Referring Provider: Treating Provider/Extender: Samara Snide Weeks in Treatment: 6 History of Present Illness HPI Description: 10/04/2020 upon evaluation today patient presents for initial inspection here in the clinic concerning a fairly concerning wound over her right fifth toe towards the medial aspect which actually probes all the way down to bone. There is evidence of necrotic muscle as well and I do see tendon exposed as well. Subsequently this does have me very concerned about where things stand here. Again this is the first time that I am seeing her and on top of everything else her ABIs are noncompressible. I think that arterial flow is the primary concern here. Of note the patient states this was first noted July 1. She had a x-ray on July 4 which was negative for bone infection. She has been on 2 antibiotics she is unsure of the name. With that being said her most recent hemoglobin A1c was 8.1 that was  December 2022. She otherwise does have a history obviously of diabetes mellitus type 2, hypertension, end-stage renal disease with dependence on renal dialysis, and she is also dependent on supplemental oxygen. Readmission: 05/14/2021 this is a patient whom I previously seen October 04, 2020. With that being said she is subsequently on 21 October 2020 ended up having an amputation of her fifth digit of her foot unfortunately. This had progressed  fairly quickly into what sounds to been gangrene at that time. Subsequently right now based on what I am seeing the patient actually has a necrotic area on the end of her foot and has been recommended that the only option really here is going to be a amputation which would be a below-knee amputation. She is really not interested in that and wants to try to give this every chance it can to heal. For that reason I discussed with her that definitely we can see what we can do to try to improve this situation and try to help get this doing better as best we can. With that being said there is definitely a portion of her wound that is just not good to be able to heal no matter what it is already starting to auto amputate. This includes potentially digits of her foot as well based on what I am seeing. She does have confirmed osteomyelitis noted by MRI. She is also currently on IV vancomycin. She does undergo dialysis on Tuesday, Thursday, and Saturday. The patient does have a history of diabetes mellitus type 2, congestive heart failure, hypertension, end-stage renal disease, dependence on renal dialysis, and dependence on supplemental oxygen. With that being said I am going to need to look into her heart failure and the significance of this as far as hyperbarics is concerned before we initiate treatment. She is probably also going to need an updated EKG and chest x-ray unless its already been done recently. 05/28/2021 upon evaluation today patient appears to be doing well  with regard to her wound all things considered. I am going to try to see what we can do about trimmed away some of the necrotic tissue that is really just hanging on here and there and not really helpful. Feel like if we can slowly start doing this maybe we will be able to get to the point where this is better and better as far as cleaning up the surface of the wound is concerned. Still as I explained I do believe some of her toes are not salvageable but again she is looking more towards autoamputation versus want to do anything definitive here from a surgical standpoint. I have been researching whether she would be an okay candidate for hyperbarics or not and I found that her x-ray but she had a chest x-ray recently was negative for any acute disease and appear to be stable. EKG was also stable. She did also have a cardiac echo which showed that her ejection fraction was 60 to 65% and therefore I think she is a candidate for hyperbarics. Again I do believe that this can help with preventing amputation which right now she has been told that her only option would be a below-knee amputation. Nonetheless I think that if we can get the heel leading to spread and the majority of this cleared away that it is possible we might even be able to get a surgeon just to surgically remove the nonviable toes and not have to go the full way of complete amputation. Nonetheless we do have a ways to go before we will get to that point. Either way I think the hyperbarics could be an excellent support as far as that is concerned. 06-05-2021 upon evaluation today patient's wound is showing some signs of improvement. This does seem to be slow and of course it is understandable [name], [name] ([id number]) [id number] Page 3 of 11 considering what we are seeing. She has discussed and looked  up information about hyperbarics and in the end comes back today and states that she does want to proceed. I  honestly do feel like that is her best chance at saving the foot. Even if we are able to get her into a surgeon after we obtain some good healing approximately so that there is not as much necrotic tissue in general she may even be able to have a transmetatarsal amputation and get this to heal. Again I am not saying this is can be an easy road but it may be her best road as far as thinking about how to achieve some type of complete healing as I do not believe the toes are viable at all to be honest. 07-06-2021 upon evaluation today patient appears to be doing well currently in regard to her foot all things considered. Again she unfortunately was unable to do the hyperbaric oxygen therapy this was due to her claustrophobia. Nonetheless she tells me that she is pleased with how the foot appears and she does feel like there is good improvement going on here. In general I think that we are on the right track. If she is going to take a very long time and to be honest the route of autoamputation which is pretty much the way this is going is not a quick or easy road but nonetheless she seems to be taking care of her foot quite well. She is in good spirits. 07-27-2021 upon evaluation today patient appears to be doing well currently in regard to her foot all things considered. A lot of the eschar is starting to lift up around the edges which is good there is still significant necrotic tissue on the distal portion of the foot. This is slowly going to work its way off. Based on what I am seeing currently I do not believe that there is any evidence of infection which is good news. 08-31-2021 upon evaluation today patient's wound actually appears to be doing decently well. I do not see anything in particular to try to trim away today. With that being said she continues to slowly allow this to do what is going to do as far as autoamputation is concerned. I did have another conversation with her today about the  possibility of seeing a surgeon for a transmetatarsal amputation which again would help to get this to close faster and obviously I think would be a much speedy year and safer way to proceed with amputation. With that being said she tells me that she would really prefer to just let this go at "God pace". 09-28-2021 upon evaluation today patient's foot actually appears to be doing much better. Fortunately I do not see any signs of infection which is great news and overall I am extremely pleased with where we stand today. There does not appear to be any signs of active infection locally or systemically which is great news. No fevers, chills, nausea, vomiting, or diarrhea. 10-26-2021 upon evaluation today patient appears to be doing well currently in regard to her foot all things considered. Fortunately I do not see any evidence of active infection locally or systemically at this time which is great news. No fevers, chills, nausea, vomiting, or diarrhea. She does have some of the eschar that is lifting up even a little bit more compared to last time I saw her. 11-30-2021 upon evaluation today patient's wound is still continuing to have some areas that lift up although there is nothing really that I feel like I  could trim away too much today to be honest. Fortunately I do not see any evidence of systemic infection though locally she has been concerned about some drainage I do not see anything that seems to be actively red or erythematous or warm to touch which is good news. No fevers, chills, nausea, vomiting, or diarrhea. 12-21-2021 upon evaluation patient's foot ulcer actually is showing signs of some improvement with healthier tissue noted at areas with eschar that keeps clearing off. There is a bit of the eschar that is loosening up today as well and I think we will get a be able to get some of this cleared away that we have been working on for a while. Fortunately I do not see any evidence of infection  locally or systemically at this time which is great news. No fevers, chills, nausea, vomiting, or diarrhea. 01-11-2022 upon evaluation today patient appears to be doing decently well in regard to her foot she still has a necrotic area on the tip of her foot but she is tolerating the dressing changes without complication. Fortunately there does not appear to be any signs of infection locally or systemically at this time which is great news. No fevers, chills, nausea, vomiting, or diarrhea. 02-01-2022 upon evaluation today patient continues to show signs of improvement in regard to the area where we have been able to remove the eschar. Unfortunately the tip of the foot I think is the part that is completely dead and not going to be coming back as far as any healing is concerned. I have discussed this with the patient multiple times however she does not want to have any type of surgical intervention. Obviously in that regard we will try to do what we can as best we can. 02-15-2022 upon evaluation today patient appears to be doing well with regard to her foot. She is actually showing some signs of improvement she has had some drainage but nothing appears to be doing too poorly overall. I do not see any signs of infection and I am pleased in that regard. Fortunately I do not see any evidence of infection locally nor systemically which is great news. 03-01-2022 upon evaluation today patient appears to be doing well currently in regard to her wounds. She has been tolerating the dressing changes without complication. Fortunately I do not see any signs of active infection locally nor systemically which is great news. No fevers, chills, nausea, vomiting, or diarrhea. 03-15-2022 upon evaluation today patient's wound continues to show signs of loosening the splint some of the eschar is concerned on the tip of her foot. Fortunately I do not see any evidence of infection systemically which is good news locally I feel  like she is making good progress here. Overall I am extremely happy with where things stand. Especially considering the fact that really she does need to have an amputation of the necrotic portion of the end of her foot. Again if we got rid of those toes I do think that this could actually heal much more effectively and quickly with that being said she is completely against going down that road so we will try to do what we can as conservatively as we can while still making some progress here. 04-01-2022 upon evaluation today patient's foot unfortunately does not appear to be doing as well as I would like to see. I discussed with her today that I do believe she probably needs to be very mindful of this and how things are progressing.  I explained as well that this is what I been most worried about the whole time that I been seeing her since really at least March of last year when I started seeing her again. Subsequently she notes that she has had more drainage also feel that the drainage is more discolored which is not good. 04-08-2022 upon evaluation today patient appears to be doing well currently in regard to her foot ulcer this is much better than last week's evaluation. Fortunately I do not see any signs of active infection systemically though locally we still see some drainage I think this is much better with the doxycycline she has been taking that since we got the results back of the culture nothing has really changed in that regard. Fortunately I think that we are on the right track. 04-15-2022 upon evaluation today patient unfortunately appears to be doing a bit worse compared to her normal. She tells me that she has been sick with what she presumed might have been a GI bug over the weekend but to be honest I am not so certain that she could not be showing early signs of sepsis. Her foot is starting to become a little bit more purulent as far as the drainage is concerned of hide on doxycycline for  a couple weeks already which seemed to help initially to a degree but unfortunately I am just not sure this is maintaining. It started to get a little bit more weight around the edges and is starting to especially around the lateral toe show signs of progression at this point. I am coupled with the fact that her blood pressure is low today compared to her normal concerned that we should probably have her go to the ER for further evaluation and treatment in order to evaluate for the possibility of early sepsis. This is something we have had a close eye on for quite some time and it is something that I think needs to be fully evaluated at this point. 05-17-2022 upon evaluation today patient's wound is continuing to maintain stability for the most part. I do not see any signs of infection at this point which is good news I am pleased with that. The tip of her foot where she has gangrene also has remained stable and dry which is also good news. Overall I am pleased with what we are seeing today. 06-07-2022 upon evaluation today patient appears to be doing well currently in regard to her wound this is actually showing signs of improvement except for the distal portion where she has the necrotic tissue which again is not going to change. Fortunately I do not see any signs of active infection locally nor systemically which is great news. No fevers, chills, nausea, vomiting, or diarrhea. 06-21-2022 upon evaluation today patient appears to be doing well currently in regard to her wounds all things considered. She still has significant issues here with her toes. Again she is previously been attempted to undergo hyperbaric oxygen therapy but did not have the ability to tolerate this. Fortunately I do not see any signs of active infection locally nor systemically at this time. [name], [name] ([id number]) [id number] Page 4 of 11 07-12-2022 upon evaluation today patient appears to be doing  well currently in regard to her wound all things considered. Her foot is showing signs of starting to auto amputate the distal portion of her foot she has tissue growing up underneath the eschar which is starting to lift all around the edges. This is  both good and bad she has been always holding out hope that the toes would revised but the truth of the matter is that they are not going to. This means that at some point she is going to have the end of her foot/toes actually fall off. It is just a matter of time and again I explained to her that this could still be another year before that happens. She still does not want to proceed with any type of surgery at this point. 08-02-2022 upon evaluation today patient's wound still continues to show signs of good improvement. Fortunately I do not see any evidence of active infection. With that being said the end of the foot is continuing to act as if it is sectioning off and I do believe that there is can be a time shortly where this is going to auto amputate. Electronic Signature(s) Signed: 08/02/2022 12:53:06 PM By: Allen Derry PA-C Entered By: Allen Derry on 08/02/2022 12:53:06 -------------------------------------------------------------------------------- Physical Exam Details Patient Name: Date of Service: [name], [name] 08/02/2022 12:00 PM Medical Record Number: [id number] Patient Account Number: [id number] Date of Birth/Sex: Treating RN: [birth year] (52 y.o. [name] Primary Care Provider: Orson Eva Other Clinician: Betha Loa Referring Provider: Treating Provider/Extender: Samara Snide Weeks in Treatment: 55 Constitutional Obese and well-hydrated in no acute distress. Respiratory normal breathing without difficulty. Psychiatric this patient is able to make decisions and demonstrates good insight into disease process. Alert and Oriented x 3. pleasant and cooperative. Notes I did perform sharp debridement on  the plantar aspect of the patient's foot which she tolerated today without complication wound bed appears to be doing better postdebridement. With that being said there is still definitely some ways to go to get this completely healed and overall I think that she is slowly making it to that point but again it has been a very slow process. There is still quite a bit Dew on the bottom/plantar aspect of the foot. Electronic Signature(s) Signed: 08/02/2022 12:53:56 PM By: Allen Derry PA-C Entered By: Allen Derry on 08/02/2022 12:53:56 -------------------------------------------------------------------------------- Physician Orders Details Patient Name: Date of Service: [place], [name] 08/02/2022 12:00 PM Medical Record Number: [id number] Patient Account Number: [id number] Date of Birth/Sex: Treating RN: 04-07-1970 (52 y.o. [name] Primary Care Provider: Orson Eva Other Clinician: Betha Loa Referring Provider: Treating Provider/Extender: Samara Snide [place], [place] ([id number]) [id number] Page 5 of 11 Weeks in Treatment: 59 Verbal / Phone Orders: Yes Clinician: Angelina Pih Read Back and Verified: Yes Diagnosis Coding ICD-10 Coding Code Description M86.371 Chronic multifocal osteomyelitis, right ankle and foot E11.621 Type 2 diabetes mellitus with foot ulcer L97.514 Non-pressure chronic ulcer of other part of right foot with necrosis of bone I50.42 Chronic combined systolic (congestive) and diastolic (congestive) heart failure I10 Essential (primary) hypertension N18.6 End stage renal disease Z99.2 Dependence on renal dialysis Z99.81 Dependence on supplemental oxygen Follow-up Appointments Return Appointment in 3 weeks. Nurse Visit as needed Home Health Home Health Company: - [facility] for wound care. May utilize formulary equivalent dressing for wound treatment orders unless otherwise  specified. Home Health Nurse may visit PRN to address patients wound care needs. Scheduled days for dressing changes to be completed; exception, patient has scheduled wound care visit that day. **Please direct any NON-WOUND related issues/requests for orders to patient's Primary Care Physician. **If current dressing causes regression in wound condition, may D/C ordered dressing product/s and apply Normal Saline Moist Dressing daily until next  Wound Healing Center or Other MD appointment. **Notify Wound Healing Center of regression in wound condition at [phone]. Bathing/ Shower/ Hygiene Clean wound with Normal Saline or wound cleanser. Wash wounds with antibacterial soap and water. May shower; gently cleanse wound with antibacterial soap, rinse and pat dry prior to dressing wounds No tub bath. Off-Loading Open toe surgical shoe Medications-Please add to medication list. ntibiotic - gentamycin to open area of foot Topical A Wound Treatment Wound #2 - Foot Wound Laterality: Right, Circumferential Cleanser: Normal Saline 1 x Per Day/30 Days Discharge Instructions: Wash your hands with soap and water. Remove old dressing, discard into plastic bag and place into trash. Cleanse the wound with Normal Saline prior to applying a clean dressing using gauze sponges, not tissues or cotton balls. Do not scrub or use excessive force. Pat dry using gauze sponges, not tissue or cotton balls. Cleanser: Soap and Water 1 x Per Day/30 Days Discharge Instructions: Gently cleanse wound with antibacterial soap, rinse and pat dry prior to dressing wounds Topical: Gentamicin 1 x Per Day/30 Days Discharge Instructions: Apply to open area of foot Prim Dressing: Gauze 1 x Per Day/30 Days ary Discharge Instructions: soak with Betadine and wrap around toes Prim Dressing: Silvercel 4 1/4x 4 1/4 (in/in) 1 x Per Day/30 Days ary Discharge Instructions: Apply Silvercel 4 1/4x 4 1/4 (in/in) as instructed Secondary  Dressing: ABD Pad 5x9 (in/in) 1 x Per Day/30 Days Discharge Instructions: Cover with ABD pad Secured With: Medipore T - 96M Medipore H Soft Cloth Surgical T ape ape, 2x2 (in/yd) 1 x Per Day/30 Days Secured With: Kerlix Roll Sterile or Non-Sterile 6-ply 4.5x4 (yd/yd) 1 x Per Day/30 Days Discharge Instructions: Apply Kerlix as directed Secured With: Stretch Net Dressing, Latex-free, Size 5, Small-Head / Shoulder / Thigh 1 x Per Day/30 Days Electronic Signature(s) Signed: 08/02/2022 1:19:53 PM By: Betha Loa Signed: 08/06/2022 6:26:43 PM By: Allen Derry PA-C Previous Signature: 08/02/2022 12:57:26 PM Version By: Allen Derry PA-C [name], [name] ([id number]) [id number] Page 6 of 11 Previous Signature: 08/02/2022 12:57:26 PM Version By: Allen Derry PA-C Entered By: Betha Loa on 08/02/2022 13:10:57 -------------------------------------------------------------------------------- Problem List Details Patient Name: Date of Service: [place], [name] 08/02/2022 12:00 PM Medical Record Number: [id number] Patient Account Number: [id number] Date of Birth/Sex: Treating RN: Aug 20, 1970 (52 y.o. [name] Primary Care Provider: Orson Eva Other Clinician: Betha Loa Referring Provider: Treating Provider/Extender: Samara Snide Weeks in Treatment: 63 Active Problems ICD-10 Encounter Code Description Active Date MDM Diagnosis M86.371 Chronic multifocal osteomyelitis, right ankle and foot 05/18/2021 No Yes E11.621 Type 2 diabetes mellitus with foot ulcer 05/18/2021 No Yes L97.514 Non-pressure chronic ulcer of other part of right foot with necrosis of bone 05/18/2021 No Yes I50.42 Chronic combined systolic (congestive) and diastolic (congestive) heart failure 05/18/2021 No Yes I10 Essential (primary) hypertension 05/18/2021 No Yes N18.6 End stage renal disease 05/18/2021 No Yes Z99.2 Dependence on renal dialysis 05/18/2021 No Yes Z99.81 Dependence  on supplemental oxygen 05/18/2021 No Yes Inactive Problems Resolved Problems Electronic Signature(s) Signed: 08/02/2022 12:07:46 PM By: Allen Derry PA-C Entered By: Allen Derry on 08/02/2022 12:07:45 [name], [name] ([id number]) [id number] Page 7 of 11 -------------------------------------------------------------------------------- Progress Note Details Patient Name: Date of Service: [name], [name] 08/02/2022 12:00 PM Medical Record Number: [id number] Patient Account Number: [id number] Date of Birth/Sex: Treating RN: [birth year] (52 y.o. [name] Primary Care Provider: Orson Eva Other Clinician: Betha Loa Referring Provider: Treating Provider/Extender: Samara Snide Weeks in Treatment: [phone]  Subjective Chief Complaint Information obtained from Patient Right foot gangrene and osteomyelitis History of Present Illness (HPI) 10/04/2020 upon evaluation today patient presents for initial inspection here in the clinic concerning a fairly concerning wound over her right fifth toe towards the medial aspect which actually probes all the way down to bone. There is evidence of necrotic muscle as well and I do see tendon exposed as well. Subsequently this does have me very concerned about where things stand here. Again this is the first time that I am seeing her and on top of everything else her ABIs are noncompressible. I think that arterial flow is the primary concern here. Of note the patient states this was first noted July 1. She had a x-ray on July 4 which was negative for bone infection. She has been on 2 antibiotics she is unsure of the name. With that being said her most recent hemoglobin A1c was 8.1 that was December 2022. She otherwise does have a history obviously of diabetes mellitus type 2, hypertension, end-stage renal disease with dependence on renal dialysis, and she is also dependent on supplemental  oxygen. Readmission: 05/14/2021 this is a patient whom I previously seen October 04, 2020. With that being said she is subsequently on 21 October 2020 ended up having an amputation of her fifth digit of her foot unfortunately. This had progressed fairly quickly into what sounds to been gangrene at that time. Subsequently right now based on what I am seeing the patient actually has a necrotic area on the end of her foot and has been recommended that the only option really here is going to be a amputation which would be a below-knee amputation. She is really not interested in that and wants to try to give this every chance it can to heal. For that reason I discussed with her that definitely we can see what we can do to try to improve this situation and try to help get this doing better as best we can. With that being said there is definitely a portion of her wound that is just not good to be able to heal no matter what it is already starting to auto amputate. This includes potentially digits of her foot as well based on what I am seeing. She does have confirmed osteomyelitis noted by MRI. She is also currently on IV vancomycin. She does undergo dialysis on Tuesday, Thursday, and Saturday. The patient does have a history of diabetes mellitus type 2, congestive heart failure, hypertension, end-stage renal disease, dependence on renal dialysis, and dependence on supplemental oxygen. With that being said I am going to need to look into her heart failure and the significance of this as far as hyperbarics is concerned before we initiate treatment. She is probably also going to need an updated EKG and chest x-ray unless its already been done recently. 05/28/2021 upon evaluation today patient appears to be doing well with regard to her wound all things considered. I am going to try to see what we can do about trimmed away some of the necrotic tissue that is really just hanging on here and there and not really  helpful. Feel like if we can slowly start doing this maybe we will be able to get to the point where this is better and better as far as cleaning up the surface of the wound is concerned. Still as I explained I do believe some of her toes are not salvageable but again she is looking more towards autoamputation versus  want to do anything definitive here from a surgical standpoint. I have been researching whether she would be an okay candidate for hyperbarics or not and I found that her x-ray but she had a chest x-ray recently was negative for any acute disease and appear to be stable. EKG was also stable. She did also have a cardiac echo which showed that her ejection fraction was 60 to 65% and therefore I think she is a candidate for hyperbarics. Again I do believe that this can help with preventing amputation which right now she has been told that her only option would be a below-knee amputation. Nonetheless I think that if we can get the heel leading to spread and the majority of this cleared away that it is possible we might even be able to get a surgeon just to surgically remove the nonviable toes and not have to go the full way of complete amputation. Nonetheless we do have a ways to go before we will get to that point. Either way I think the hyperbarics could be an excellent support as far as that is concerned. 06-05-2021 upon evaluation today patient's wound is showing some signs of improvement. This does seem to be slow and of course it is understandable considering what we are seeing. She has discussed and looked up information about hyperbarics and in the end comes back today and states that she does want to proceed. I honestly do feel like that is her best chance at saving the foot. Even if we are able to get her into a surgeon after we obtain some good healing approximately so that there is not as much necrotic tissue in general she may even be able to have a transmetatarsal amputation and  get this to heal. Again I am not saying this is can be an easy road but it may be her best road as far as thinking about how to achieve some type of complete healing as I do not believe the toes are viable at all to be honest. 07-06-2021 upon evaluation today patient appears to be doing well currently in regard to her foot all things considered. Again she unfortunately was unable to do the hyperbaric oxygen therapy this was due to her claustrophobia. Nonetheless she tells me that she is pleased with how the foot appears and she does feel like there is good improvement going on here. In general I think that we are on the right track. If she is going to take a very long time and to be honest the route of autoamputation which is pretty much the way this is going is not a quick or easy road but nonetheless she seems to be taking care of her foot quite well. She is in good spirits. 07-27-2021 upon evaluation today patient appears to be doing well currently in regard to her foot all things considered. A lot of the eschar is starting to lift up around the edges which is good there is still significant necrotic tissue on the distal portion of the foot. This is slowly going to work its way off. Based on what I am seeing currently I do not believe that there is any evidence of infection which is good news. 08-31-2021 upon evaluation today patient's wound actually appears to be doing decently well. I do not see anything in particular to try to trim away today. With that being said she continues to slowly allow this to do what is going to do as far as autoamputation is concerned.  I did have another conversation with her today about the possibility of seeing a surgeon for a transmetatarsal amputation which again would help to get this to close faster and obviously I think would be a much speedy year and safer way to proceed with amputation. With that being said she tells me that she would really prefer to just let this  go at "God pace". [name], [name] ([id number]) [id number] Page 8 of 11 09-28-2021 upon evaluation today patient's foot actually appears to be doing much better. Fortunately I do not see any signs of infection which is great news and overall I am extremely pleased with where we stand today. There does not appear to be any signs of active infection locally or systemically which is great news. No fevers, chills, nausea, vomiting, or diarrhea. 10-26-2021 upon evaluation today patient appears to be doing well currently in regard to her foot all things considered. Fortunately I do not see any evidence of active infection locally or systemically at this time which is great news. No fevers, chills, nausea, vomiting, or diarrhea. She does have some of the eschar that is lifting up even a little bit more compared to last time I saw her. 11-30-2021 upon evaluation today patient's wound is still continuing to have some areas that lift up although there is nothing really that I feel like I could trim away too much today to be honest. Fortunately I do not see any evidence of systemic infection though locally she has been concerned about some drainage I do not see anything that seems to be actively red or erythematous or warm to touch which is good news. No fevers, chills, nausea, vomiting, or diarrhea. 12-21-2021 upon evaluation patient's foot ulcer actually is showing signs of some improvement with healthier tissue noted at areas with eschar that keeps clearing off. There is a bit of the eschar that is loosening up today as well and I think we will get a be able to get some of this cleared away that we have been working on for a while. Fortunately I do not see any evidence of infection locally or systemically at this time which is great news. No fevers, chills, nausea, vomiting, or diarrhea. 01-11-2022 upon evaluation today patient appears to be doing decently well in regard to her foot  she still has a necrotic area on the tip of her foot but she is tolerating the dressing changes without complication. Fortunately there does not appear to be any signs of infection locally or systemically at this time which is great news. No fevers, chills, nausea, vomiting, or diarrhea. 02-01-2022 upon evaluation today patient continues to show signs of improvement in regard to the area where we have been able to remove the eschar. Unfortunately the tip of the foot I think is the part that is completely dead and not going to be coming back as far as any healing is concerned. I have discussed this with the patient multiple times however she does not want to have any type of surgical intervention. Obviously in that regard we will try to do what we can as best we can. 02-15-2022 upon evaluation today patient appears to be doing well with regard to her foot. She is actually showing some signs of improvement she has had some drainage but nothing appears to be doing too poorly overall. I do not see any signs of infection and I am pleased in that regard. Fortunately I do not see any evidence of infection locally nor systemically  which is great news. 03-01-2022 upon evaluation today patient appears to be doing well currently in regard to her wounds. She has been tolerating the dressing changes without complication. Fortunately I do not see any signs of active infection locally nor systemically which is great news. No fevers, chills, nausea, vomiting, or diarrhea. 03-15-2022 upon evaluation today patient's wound continues to show signs of loosening the splint some of the eschar is concerned on the tip of her foot. Fortunately I do not see any evidence of infection systemically which is good news locally I feel like she is making good progress here. Overall I am extremely happy with where things stand. Especially considering the fact that really she does need to have an amputation of the necrotic portion of the  end of her foot. Again if we got rid of those toes I do think that this could actually heal much more effectively and quickly with that being said she is completely against going down that road so we will try to do what we can as conservatively as we can while still making some progress here. 04-01-2022 upon evaluation today patient's foot unfortunately does not appear to be doing as well as I would like to see. I discussed with her today that I do believe she probably needs to be very mindful of this and how things are progressing. I explained as well that this is what I been most worried about the whole time that I been seeing her since really at least March of last year when I started seeing her again. Subsequently she notes that she has had more drainage also feel that the drainage is more discolored which is not good. 04-08-2022 upon evaluation today patient appears to be doing well currently in regard to her foot ulcer this is much better than last week's evaluation. Fortunately I do not see any signs of active infection systemically though locally we still see some drainage I think this is much better with the doxycycline she has been taking that since we got the results back of the culture nothing has really changed in that regard. Fortunately I think that we are on the right track. 04-15-2022 upon evaluation today patient unfortunately appears to be doing a bit worse compared to her normal. She tells me that she has been sick with what she presumed might have been a GI bug over the weekend but to be honest I am not so certain that she could not be showing early signs of sepsis. Her foot is starting to become a little bit more purulent as far as the drainage is concerned of hide on doxycycline for a couple weeks already which seemed to help initially to a degree but unfortunately I am just not sure this is maintaining. It started to get a little bit more weight around the edges and is starting to  especially around the lateral toe show signs of progression at this point. I am coupled with the fact that her blood pressure is low today compared to her normal concerned that we should probably have her go to the ER for further evaluation and treatment in order to evaluate for the possibility of early sepsis. This is something we have had a close eye on for quite some time and it is something that I think needs to be fully evaluated at this point. 05-17-2022 upon evaluation today patient's wound is continuing to maintain stability for the most part. I do not see any signs of infection at this  point which is good news I am pleased with that. The tip of her foot where she has gangrene also has remained stable and dry which is also good news. Overall I am pleased with what we are seeing today. 06-07-2022 upon evaluation today patient appears to be doing well currently in regard to her wound this is actually showing signs of improvement except for the distal portion where she has the necrotic tissue which again is not going to change. Fortunately I do not see any signs of active infection locally nor systemically which is great news. No fevers, chills, nausea, vomiting, or diarrhea. 06-21-2022 upon evaluation today patient appears to be doing well currently in regard to her wounds all things considered. She still has significant issues here with her toes. Again she is previously been attempted to undergo hyperbaric oxygen therapy but did not have the ability to tolerate this. Fortunately I do not see any signs of active infection locally nor systemically at this time. 07-12-2022 upon evaluation today patient appears to be doing well currently in regard to her wound all things considered. Her foot is showing signs of starting to auto amputate the distal portion of her foot she has tissue growing up underneath the eschar which is starting to lift all around the edges. This is both good and bad she has been  always holding out hope that the toes would revised but the truth of the matter is that they are not going to. This means that at some point she is going to have the end of her foot/toes actually fall off. It is just a matter of time and again I explained to her that this could still be another year before that happens. She still does not want to proceed with any type of surgery at this point. 08-02-2022 upon evaluation today patient's wound still continues to show signs of good improvement. Fortunately I do not see any evidence of active infection. With that being said the end of the foot is continuing to act as if it is sectioning off and I do believe that there is can be a time shortly where this is going to auto amputate. Objective Constitutional Marchetta, [name] ([id number]) [id number] Page 9 of 11 Obese and well-hydrated in no acute distress. Vitals Time Taken: 12:09 PM, Temperature: 98.5 F, Pulse: 106 bpm, Respiratory Rate: 18 breaths/min, Blood Pressure: 106/75 mmHg, Pulse Oximetry: 96 %. Respiratory normal breathing without difficulty. Psychiatric this patient is able to make decisions and demonstrates good insight into disease process. Alert and Oriented x 3. pleasant and cooperative. General Notes: I did perform sharp debridement on the plantar aspect of the patient's foot which she tolerated today without complication wound bed appears to be doing better postdebridement. With that being said there is still definitely some ways to go to get this completely healed and overall I think that she is slowly making it to that point but again it has been a very slow process. There is still quite a bit Dew on the bottom/plantar aspect of the foot. Integumentary (Hair, Skin) Wound #2 status is Open. Original cause of wound was Gradually Appeared. The date acquired was: 10/15/2020. The wound has been in treatment 63 weeks. The wound is located on the Right,Circumferential  Foot. The wound measures 11.5cm length x 7.5cm width x 0.3cm depth; 67.741cm^2 area and 20.322cm^3 volume. There is Fat Layer (Subcutaneous Tissue) exposed. There is a medium amount of serosanguineous drainage noted. There is medium (34-66%) granulation within the wound  bed. There is a medium (34-66%) amount of necrotic tissue within the wound bed including Eschar and Necrosis of Bone. Assessment Active Problems ICD-10 Chronic multifocal osteomyelitis, right ankle and foot Type 2 diabetes mellitus with foot ulcer Non-pressure chronic ulcer of other part of right foot with necrosis of bone Chronic combined systolic (congestive) and diastolic (congestive) heart failure Essential (primary) hypertension End stage renal disease Dependence on renal dialysis Dependence on supplemental oxygen Procedures Wound #2 Pre-procedure diagnosis of Wound #2 is a Diabetic Wound/Ulcer of the Lower Extremity located on the Right,Circumferential Foot .Severity of Tissue Pre Debridement is: Necrosis of bone. There was a Excisional Skin/Subcutaneous Tissue/Muscle Debridement with a total area of 33.85 sq cm performed by Allen Derry, PA-C. With the following instrument(s): Curette to remove Viable and Non-Viable tissue/material. Material removed includes Muscle and Slough and. A time out was conducted at 12:45, prior to the start of the procedure. A Minimum amount of bleeding was controlled with Pressure. The procedure was tolerated well. Post Debridement Measurements: 11.5cm length x 7.5cm width x 0.3cm depth; 20.322cm^3 volume. Character of Wound/Ulcer Post Debridement is stable. Severity of Tissue Post Debridement is: Necrosis of bone. Post procedure Diagnosis Wound #2: Same as Pre-Procedure Plan Follow-up Appointments: Return Appointment in 3 weeks. Nurse Visit as needed Home Health: Home Health Company: - [facility] for wound care. May utilize formulary equivalent dressing for wound  treatment orders unless otherwise specified. Home Health Nurse may visit PRN to address patients wound care needs. Scheduled days for dressing changes to be completed; exception, patient has scheduled wound care visit that day. **Please direct any NON-WOUND related issues/requests for orders to patient's Primary Care Physician. **If current dressing causes regression in wound condition, may D/C ordered dressing product/s and apply Normal Saline Moist Dressing daily until next Wound Healing Center or Other MD appointment. **Notify Wound Healing Center of regression in wound condition at [phone]. Bathing/ Shower/ Hygiene: Clean wound with Normal Saline or wound cleanser. Wash wounds with antibacterial soap and water. May shower; gently cleanse wound with antibacterial soap, rinse and pat dry prior to dressing wounds No tub bath. Off-Loading: Open toe surgical shoe Medications-Please add to medication list.: Topical Antibiotic - gentamycin to open area of foot WOUND #2: - Foot Wound Laterality: Right, Circumferential Cleanser: Normal Saline 1 x Per Day/30 Days Discharge Instructions: Wash your hands with soap and water. Remove old dressing, discard into plastic bag and place into trash. Cleanse the wound with Normal Saline prior to applying a clean dressing using gauze sponges, not tissues or cotton balls. Do not scrub or use excessive force. Pat dry using gauze sponges, not tissue or cotton balls. Cleanser: Soap and Water 1 x Per Day/30 Days Stamant, [name] ([id number]) [id number] Page 10 of 11 Discharge Instructions: Gently cleanse wound with antibacterial soap, rinse and pat dry prior to dressing wounds Topical: Gentamicin 1 x Per Day/30 Days Discharge Instructions: Apply to open area of foot Prim Dressing: Gauze 1 x Per Day/30 Days ary Discharge Instructions: soak with Betadine and wrap around toes Prim Dressing: Silvercel 4 1/4x 4 1/4 (in/in) 1 x Per  Day/30 Days ary Discharge Instructions: Apply Silvercel 4 1/4x 4 1/4 (in/in) as instructed Secondary Dressing: ABD Pad 5x9 (in/in) 1 x Per Day/30 Days Discharge Instructions: Cover with ABD pad Secured With: Medipore T - 36M Medipore H Soft Cloth Surgical T ape ape, 2x2 (in/yd) 1 x Per Day/30 Days Secured With: [employer] Sterile or Non-Sterile 6-ply 4.5x4 (yd/yd) 1 x  Per Day/30 Days Discharge Instructions: Apply Kerlix as directed Secured With: Stretch Net Dressing, Latex-free, Size 5, Small-Head / Shoulder / Thigh 1 x Per Day/30 Days 1. Based on what I am seeing I do believe that the patient should continue with the current medication regimen. She is doing well with the dressings. 2. I am going to specifically state that she uses the Betadine into the foot and then subsequently should be using the silver cell on the center part of the foot. 3. I am also can recommend she should stay off of this I think this still going to be important. She still has no desire to proceed with any type of surgical intervention. We will see patient back for reevaluation in 3 weeks here in the clinic. If anything worsens or changes patient will contact our office for additional recommendations. Electronic Signature(s) Signed: 08/19/2022 9:05:54 AM By: Elliot Gurney, BSN, RN, CWS, Kim RN, BSN Signed: 08/19/2022 5:57:02 PM By: Allen Derry PA-C Previous Signature: 08/02/2022 12:54:31 PM Version By: Allen Derry PA-C Entered By: Elliot Gurney BSN, RN, CWS, Kim on 08/19/2022 09:05:54 -------------------------------------------------------------------------------- SuperBill Details Patient Name: Date of Service: [name], [name] 08/02/2022 Medical Record Number: [id number] Patient Account Number: [id number] Date of Birth/Sex: Treating RN: 30-Jun-1970 (52 y.o. [name] Primary Care Provider: Orson Eva Other Clinician: Betha Loa Referring Provider: Treating Provider/Extender: Samara Snide Weeks in  Treatment: 63 Diagnosis Coding ICD-10 Codes Code Description M86.371 Chronic multifocal osteomyelitis, right ankle and foot E11.621 Type 2 diabetes mellitus with foot ulcer L97.514 Non-pressure chronic ulcer of other part of right foot with necrosis of bone I50.42 Chronic combined systolic (congestive) and diastolic (congestive) heart failure I10 Essential (primary) hypertension N18.6 End stage renal disease Z99.2 Dependence on renal dialysis Z99.81 Dependence on supplemental oxygen Facility Procedures : 3 CPT4 Code: [id number] Description: 11043 - DEB MUSC/FASCIA 20 SQ CM/< ICD-10 Diagnosis Description L97.514 Non-pressure chronic ulcer of other part of right foot with necrosis of bone Modifier: Quantity: 1 : 3 Schiraldi, CPT4 Code: [id number] Breiana M ([id number]) L Description: 11046 - DEB MUSC/FASCIA EA ADDL 20 CM ICD-10 Diagnosis Description [id number] 97.514 Non-pressure chronic ulcer of other part of right foot with necrosis of bone Modifier: [id number] P Quantity: 1 age 86 of 89 Physician Procedures : CPT4 Code Description Modifier [phone] 99213 - WC PHYS LEVEL 3 - EST PT 25 ICD-10 Diagnosis Description M86.371 Chronic multifocal osteomyelitis, right ankle and foot E11.621 Type 2 diabetes mellitus with foot ulcer L97.514 Non-pressure chronic ulcer  of other part of right foot with necrosis of bone I50.42 Chronic combined systolic (congestive) and diastolic (congestive) heart failure Quantity: 1 : [id number] 11043 - WC PHYS DEBR MUSCLE/FASCIA 20 SQ CM ICD-10 Diagnosis Description L97.514 Non-pressure chronic ulcer of other part of right foot with necrosis of bone Quantity: 1 : [id number] 11046 - WC PHYS DEB MUSC/FASC EA ADDL 20 CM ICD-10 Diagnosis Description L97.514 Non-pressure chronic ulcer of other part of right foot with necrosis of bone Quantity: 1 Electronic Signature(s) Signed: 08/02/2022 1:19:53 PM By: Betha Loa Signed: 08/06/2022 6:26:43 PM By: Allen Derry  PA-C Previous Signature: 08/02/2022 12:54:46 PM Version By: Allen Derry PA-C Entered By: Betha Loa on 08/02/2022 13:15:03

## 2022-08-08 ENCOUNTER — Other Ambulatory Visit: Payer: Self-pay | Admitting: Nurse Practitioner

## 2022-08-08 DIAGNOSIS — N186 End stage renal disease: Secondary | ICD-10-CM | POA: Diagnosis not present

## 2022-08-08 DIAGNOSIS — Z992 Dependence on renal dialysis: Secondary | ICD-10-CM | POA: Diagnosis not present

## 2022-08-08 DIAGNOSIS — N2581 Secondary hyperparathyroidism of renal origin: Secondary | ICD-10-CM | POA: Diagnosis not present

## 2022-08-09 DIAGNOSIS — E1151 Type 2 diabetes mellitus with diabetic peripheral angiopathy without gangrene: Secondary | ICD-10-CM | POA: Diagnosis not present

## 2022-08-09 DIAGNOSIS — J441 Chronic obstructive pulmonary disease with (acute) exacerbation: Secondary | ICD-10-CM | POA: Diagnosis not present

## 2022-08-09 DIAGNOSIS — J9612 Chronic respiratory failure with hypercapnia: Secondary | ICD-10-CM | POA: Diagnosis not present

## 2022-08-09 DIAGNOSIS — E11621 Type 2 diabetes mellitus with foot ulcer: Secondary | ICD-10-CM | POA: Diagnosis not present

## 2022-08-09 DIAGNOSIS — E1169 Type 2 diabetes mellitus with other specified complication: Secondary | ICD-10-CM | POA: Diagnosis not present

## 2022-08-09 DIAGNOSIS — Z7982 Long term (current) use of aspirin: Secondary | ICD-10-CM | POA: Diagnosis not present

## 2022-08-09 DIAGNOSIS — L97513 Non-pressure chronic ulcer of other part of right foot with necrosis of muscle: Secondary | ICD-10-CM | POA: Diagnosis not present

## 2022-08-09 DIAGNOSIS — Z992 Dependence on renal dialysis: Secondary | ICD-10-CM | POA: Diagnosis not present

## 2022-08-09 DIAGNOSIS — I5032 Chronic diastolic (congestive) heart failure: Secondary | ICD-10-CM | POA: Diagnosis not present

## 2022-08-09 DIAGNOSIS — Z794 Long term (current) use of insulin: Secondary | ICD-10-CM | POA: Diagnosis not present

## 2022-08-09 DIAGNOSIS — M86371 Chronic multifocal osteomyelitis, right ankle and foot: Secondary | ICD-10-CM | POA: Diagnosis not present

## 2022-08-09 DIAGNOSIS — I951 Orthostatic hypotension: Secondary | ICD-10-CM | POA: Diagnosis not present

## 2022-08-09 DIAGNOSIS — D631 Anemia in chronic kidney disease: Secondary | ICD-10-CM | POA: Diagnosis not present

## 2022-08-09 DIAGNOSIS — N186 End stage renal disease: Secondary | ICD-10-CM | POA: Diagnosis not present

## 2022-08-09 DIAGNOSIS — Z9981 Dependence on supplemental oxygen: Secondary | ICD-10-CM | POA: Diagnosis not present

## 2022-08-09 DIAGNOSIS — E1122 Type 2 diabetes mellitus with diabetic chronic kidney disease: Secondary | ICD-10-CM | POA: Diagnosis not present

## 2022-08-09 DIAGNOSIS — I70203 Unspecified atherosclerosis of native arteries of extremities, bilateral legs: Secondary | ICD-10-CM | POA: Diagnosis not present

## 2022-08-09 DIAGNOSIS — Z7985 Long-term (current) use of injectable non-insulin antidiabetic drugs: Secondary | ICD-10-CM | POA: Diagnosis not present

## 2022-08-09 DIAGNOSIS — I132 Hypertensive heart and chronic kidney disease with heart failure and with stage 5 chronic kidney disease, or end stage renal disease: Secondary | ICD-10-CM | POA: Diagnosis not present

## 2022-08-09 DIAGNOSIS — E1165 Type 2 diabetes mellitus with hyperglycemia: Secondary | ICD-10-CM | POA: Diagnosis not present

## 2022-08-09 DIAGNOSIS — J9611 Chronic respiratory failure with hypoxia: Secondary | ICD-10-CM | POA: Diagnosis not present

## 2022-08-10 DIAGNOSIS — N2581 Secondary hyperparathyroidism of renal origin: Secondary | ICD-10-CM | POA: Diagnosis not present

## 2022-08-10 DIAGNOSIS — Z992 Dependence on renal dialysis: Secondary | ICD-10-CM | POA: Diagnosis not present

## 2022-08-10 DIAGNOSIS — N186 End stage renal disease: Secondary | ICD-10-CM | POA: Diagnosis not present

## 2022-08-11 DIAGNOSIS — M62830 Muscle spasm of back: Secondary | ICD-10-CM | POA: Diagnosis not present

## 2022-08-11 DIAGNOSIS — G894 Chronic pain syndrome: Secondary | ICD-10-CM | POA: Diagnosis not present

## 2022-08-12 ENCOUNTER — Ambulatory Visit: Payer: 59 | Admitting: Anesthesiology

## 2022-08-12 DIAGNOSIS — J9612 Chronic respiratory failure with hypercapnia: Secondary | ICD-10-CM | POA: Diagnosis not present

## 2022-08-12 DIAGNOSIS — E1151 Type 2 diabetes mellitus with diabetic peripheral angiopathy without gangrene: Secondary | ICD-10-CM | POA: Diagnosis not present

## 2022-08-12 DIAGNOSIS — E1169 Type 2 diabetes mellitus with other specified complication: Secondary | ICD-10-CM | POA: Diagnosis not present

## 2022-08-12 DIAGNOSIS — E1122 Type 2 diabetes mellitus with diabetic chronic kidney disease: Secondary | ICD-10-CM | POA: Diagnosis not present

## 2022-08-12 DIAGNOSIS — E11621 Type 2 diabetes mellitus with foot ulcer: Secondary | ICD-10-CM | POA: Diagnosis not present

## 2022-08-12 DIAGNOSIS — Z9981 Dependence on supplemental oxygen: Secondary | ICD-10-CM | POA: Diagnosis not present

## 2022-08-12 DIAGNOSIS — Z7982 Long term (current) use of aspirin: Secondary | ICD-10-CM | POA: Diagnosis not present

## 2022-08-12 DIAGNOSIS — I5032 Chronic diastolic (congestive) heart failure: Secondary | ICD-10-CM | POA: Diagnosis not present

## 2022-08-12 DIAGNOSIS — M86371 Chronic multifocal osteomyelitis, right ankle and foot: Secondary | ICD-10-CM | POA: Diagnosis not present

## 2022-08-12 DIAGNOSIS — I951 Orthostatic hypotension: Secondary | ICD-10-CM | POA: Diagnosis not present

## 2022-08-12 DIAGNOSIS — N186 End stage renal disease: Secondary | ICD-10-CM | POA: Diagnosis not present

## 2022-08-12 DIAGNOSIS — E1165 Type 2 diabetes mellitus with hyperglycemia: Secondary | ICD-10-CM | POA: Diagnosis not present

## 2022-08-12 DIAGNOSIS — J9611 Chronic respiratory failure with hypoxia: Secondary | ICD-10-CM | POA: Diagnosis not present

## 2022-08-12 DIAGNOSIS — I70203 Unspecified atherosclerosis of native arteries of extremities, bilateral legs: Secondary | ICD-10-CM | POA: Diagnosis not present

## 2022-08-12 DIAGNOSIS — Z7985 Long-term (current) use of injectable non-insulin antidiabetic drugs: Secondary | ICD-10-CM | POA: Diagnosis not present

## 2022-08-12 DIAGNOSIS — I132 Hypertensive heart and chronic kidney disease with heart failure and with stage 5 chronic kidney disease, or end stage renal disease: Secondary | ICD-10-CM | POA: Diagnosis not present

## 2022-08-12 DIAGNOSIS — Z794 Long term (current) use of insulin: Secondary | ICD-10-CM | POA: Diagnosis not present

## 2022-08-12 DIAGNOSIS — J441 Chronic obstructive pulmonary disease with (acute) exacerbation: Secondary | ICD-10-CM | POA: Diagnosis not present

## 2022-08-12 DIAGNOSIS — D631 Anemia in chronic kidney disease: Secondary | ICD-10-CM | POA: Diagnosis not present

## 2022-08-12 DIAGNOSIS — L97513 Non-pressure chronic ulcer of other part of right foot with necrosis of muscle: Secondary | ICD-10-CM | POA: Diagnosis not present

## 2022-08-12 DIAGNOSIS — Z992 Dependence on renal dialysis: Secondary | ICD-10-CM | POA: Diagnosis not present

## 2022-08-13 ENCOUNTER — Other Ambulatory Visit: Payer: Self-pay | Admitting: Nurse Practitioner

## 2022-08-13 DIAGNOSIS — Z992 Dependence on renal dialysis: Secondary | ICD-10-CM | POA: Diagnosis not present

## 2022-08-13 DIAGNOSIS — N186 End stage renal disease: Secondary | ICD-10-CM | POA: Diagnosis not present

## 2022-08-13 DIAGNOSIS — N2581 Secondary hyperparathyroidism of renal origin: Secondary | ICD-10-CM | POA: Diagnosis not present

## 2022-08-13 DIAGNOSIS — M869 Osteomyelitis, unspecified: Secondary | ICD-10-CM | POA: Diagnosis not present

## 2022-08-15 DIAGNOSIS — N2581 Secondary hyperparathyroidism of renal origin: Secondary | ICD-10-CM | POA: Diagnosis not present

## 2022-08-15 DIAGNOSIS — N186 End stage renal disease: Secondary | ICD-10-CM | POA: Diagnosis not present

## 2022-08-15 DIAGNOSIS — Z992 Dependence on renal dialysis: Secondary | ICD-10-CM | POA: Diagnosis not present

## 2022-08-16 ENCOUNTER — Telehealth: Payer: Self-pay | Admitting: Nurse Practitioner

## 2022-08-16 NOTE — Telephone Encounter (Signed)
Patient left VM that she is still having issues and would like a call back.

## 2022-08-17 DIAGNOSIS — N2581 Secondary hyperparathyroidism of renal origin: Secondary | ICD-10-CM | POA: Diagnosis not present

## 2022-08-17 DIAGNOSIS — N186 End stage renal disease: Secondary | ICD-10-CM | POA: Diagnosis not present

## 2022-08-17 DIAGNOSIS — Z992 Dependence on renal dialysis: Secondary | ICD-10-CM | POA: Diagnosis not present

## 2022-08-18 ENCOUNTER — Inpatient Hospital Stay: Payer: 59

## 2022-08-18 ENCOUNTER — Inpatient Hospital Stay
Admission: EM | Admit: 2022-08-18 | Discharge: 2022-09-26 | DRG: 744 | Disposition: E | Payer: 59 | Attending: Student in an Organized Health Care Education/Training Program | Admitting: Student in an Organized Health Care Education/Training Program

## 2022-08-18 ENCOUNTER — Emergency Department: Payer: 59

## 2022-08-18 ENCOUNTER — Other Ambulatory Visit: Payer: Self-pay

## 2022-08-18 DIAGNOSIS — I5032 Chronic diastolic (congestive) heart failure: Secondary | ICD-10-CM | POA: Diagnosis not present

## 2022-08-18 DIAGNOSIS — R569 Unspecified convulsions: Secondary | ICD-10-CM | POA: Diagnosis not present

## 2022-08-18 DIAGNOSIS — N898 Other specified noninflammatory disorders of vagina: Secondary | ICD-10-CM | POA: Diagnosis present

## 2022-08-18 DIAGNOSIS — I9589 Other hypotension: Secondary | ICD-10-CM | POA: Diagnosis not present

## 2022-08-18 DIAGNOSIS — J4489 Other specified chronic obstructive pulmonary disease: Secondary | ICD-10-CM | POA: Diagnosis not present

## 2022-08-18 DIAGNOSIS — E1169 Type 2 diabetes mellitus with other specified complication: Secondary | ICD-10-CM | POA: Diagnosis present

## 2022-08-18 DIAGNOSIS — I2722 Pulmonary hypertension due to left heart disease: Secondary | ICD-10-CM | POA: Diagnosis present

## 2022-08-18 DIAGNOSIS — Z91199 Patient's noncompliance with other medical treatment and regimen due to unspecified reason: Secondary | ICD-10-CM

## 2022-08-18 DIAGNOSIS — Z89421 Acquired absence of other right toe(s): Secondary | ICD-10-CM

## 2022-08-18 DIAGNOSIS — Z882 Allergy status to sulfonamides status: Secondary | ICD-10-CM

## 2022-08-18 DIAGNOSIS — J811 Chronic pulmonary edema: Secondary | ICD-10-CM | POA: Diagnosis not present

## 2022-08-18 DIAGNOSIS — I739 Peripheral vascular disease, unspecified: Secondary | ICD-10-CM | POA: Diagnosis not present

## 2022-08-18 DIAGNOSIS — R531 Weakness: Secondary | ICD-10-CM

## 2022-08-18 DIAGNOSIS — I953 Hypotension of hemodialysis: Secondary | ICD-10-CM | POA: Diagnosis not present

## 2022-08-18 DIAGNOSIS — D259 Leiomyoma of uterus, unspecified: Secondary | ICD-10-CM | POA: Diagnosis not present

## 2022-08-18 DIAGNOSIS — I469 Cardiac arrest, cause unspecified: Secondary | ICD-10-CM | POA: Diagnosis not present

## 2022-08-18 DIAGNOSIS — G928 Other toxic encephalopathy: Secondary | ICD-10-CM | POA: Diagnosis not present

## 2022-08-18 DIAGNOSIS — E876 Hypokalemia: Secondary | ICD-10-CM | POA: Diagnosis not present

## 2022-08-18 DIAGNOSIS — R0602 Shortness of breath: Secondary | ICD-10-CM | POA: Diagnosis not present

## 2022-08-18 DIAGNOSIS — F32A Depression, unspecified: Secondary | ICD-10-CM | POA: Diagnosis present

## 2022-08-18 DIAGNOSIS — R001 Bradycardia, unspecified: Secondary | ICD-10-CM | POA: Diagnosis not present

## 2022-08-18 DIAGNOSIS — R1031 Right lower quadrant pain: Secondary | ICD-10-CM | POA: Diagnosis not present

## 2022-08-18 DIAGNOSIS — G4733 Obstructive sleep apnea (adult) (pediatric): Secondary | ICD-10-CM | POA: Diagnosis not present

## 2022-08-18 DIAGNOSIS — N3001 Acute cystitis with hematuria: Secondary | ICD-10-CM | POA: Diagnosis not present

## 2022-08-18 DIAGNOSIS — L97519 Non-pressure chronic ulcer of other part of right foot with unspecified severity: Secondary | ICD-10-CM | POA: Diagnosis present

## 2022-08-18 DIAGNOSIS — I517 Cardiomegaly: Secondary | ICD-10-CM | POA: Diagnosis not present

## 2022-08-18 DIAGNOSIS — N888 Other specified noninflammatory disorders of cervix uteri: Secondary | ICD-10-CM

## 2022-08-18 DIAGNOSIS — E1152 Type 2 diabetes mellitus with diabetic peripheral angiopathy with gangrene: Secondary | ICD-10-CM | POA: Diagnosis present

## 2022-08-18 DIAGNOSIS — F419 Anxiety disorder, unspecified: Secondary | ICD-10-CM | POA: Diagnosis present

## 2022-08-18 DIAGNOSIS — Z0389 Encounter for observation for other suspected diseases and conditions ruled out: Secondary | ICD-10-CM | POA: Diagnosis not present

## 2022-08-18 DIAGNOSIS — J45909 Unspecified asthma, uncomplicated: Secondary | ICD-10-CM | POA: Diagnosis not present

## 2022-08-18 DIAGNOSIS — Z515 Encounter for palliative care: Secondary | ICD-10-CM | POA: Diagnosis not present

## 2022-08-18 DIAGNOSIS — Z79899 Other long term (current) drug therapy: Secondary | ICD-10-CM

## 2022-08-18 DIAGNOSIS — Z66 Do not resuscitate: Secondary | ICD-10-CM | POA: Diagnosis not present

## 2022-08-18 DIAGNOSIS — D631 Anemia in chronic kidney disease: Secondary | ICD-10-CM | POA: Diagnosis not present

## 2022-08-18 DIAGNOSIS — Z91048 Other nonmedicinal substance allergy status: Secondary | ICD-10-CM

## 2022-08-18 DIAGNOSIS — K3189 Other diseases of stomach and duodenum: Secondary | ICD-10-CM | POA: Diagnosis not present

## 2022-08-18 DIAGNOSIS — N390 Urinary tract infection, site not specified: Secondary | ICD-10-CM | POA: Diagnosis not present

## 2022-08-18 DIAGNOSIS — N186 End stage renal disease: Secondary | ICD-10-CM | POA: Diagnosis not present

## 2022-08-18 DIAGNOSIS — E8729 Other acidosis: Secondary | ICD-10-CM | POA: Diagnosis present

## 2022-08-18 DIAGNOSIS — I462 Cardiac arrest due to underlying cardiac condition: Secondary | ICD-10-CM | POA: Diagnosis not present

## 2022-08-18 DIAGNOSIS — B3749 Other urogenital candidiasis: Principal | ICD-10-CM | POA: Diagnosis present

## 2022-08-18 DIAGNOSIS — I4891 Unspecified atrial fibrillation: Secondary | ICD-10-CM | POA: Diagnosis not present

## 2022-08-18 DIAGNOSIS — J9622 Acute and chronic respiratory failure with hypercapnia: Secondary | ICD-10-CM | POA: Diagnosis present

## 2022-08-18 DIAGNOSIS — R578 Other shock: Secondary | ICD-10-CM | POA: Diagnosis not present

## 2022-08-18 DIAGNOSIS — Z794 Long term (current) use of insulin: Secondary | ICD-10-CM

## 2022-08-18 DIAGNOSIS — Z471 Aftercare following joint replacement surgery: Secondary | ICD-10-CM | POA: Diagnosis not present

## 2022-08-18 DIAGNOSIS — T8242XA Displacement of vascular dialysis catheter, initial encounter: Secondary | ICD-10-CM | POA: Diagnosis not present

## 2022-08-18 DIAGNOSIS — I472 Ventricular tachycardia, unspecified: Secondary | ICD-10-CM | POA: Diagnosis not present

## 2022-08-18 DIAGNOSIS — Z992 Dependence on renal dialysis: Secondary | ICD-10-CM

## 2022-08-18 DIAGNOSIS — Z8249 Family history of ischemic heart disease and other diseases of the circulatory system: Secondary | ICD-10-CM

## 2022-08-18 DIAGNOSIS — J9620 Acute and chronic respiratory failure, unspecified whether with hypoxia or hypercapnia: Secondary | ICD-10-CM | POA: Diagnosis present

## 2022-08-18 DIAGNOSIS — D509 Iron deficiency anemia, unspecified: Secondary | ICD-10-CM | POA: Diagnosis not present

## 2022-08-18 DIAGNOSIS — E871 Hypo-osmolality and hyponatremia: Secondary | ICD-10-CM | POA: Diagnosis present

## 2022-08-18 DIAGNOSIS — I5033 Acute on chronic diastolic (congestive) heart failure: Secondary | ICD-10-CM | POA: Diagnosis not present

## 2022-08-18 DIAGNOSIS — R918 Other nonspecific abnormal finding of lung field: Secondary | ICD-10-CM | POA: Diagnosis not present

## 2022-08-18 DIAGNOSIS — Z6841 Body Mass Index (BMI) 40.0 and over, adult: Secondary | ICD-10-CM

## 2022-08-18 DIAGNOSIS — I639 Cerebral infarction, unspecified: Secondary | ICD-10-CM | POA: Diagnosis not present

## 2022-08-18 DIAGNOSIS — I132 Hypertensive heart and chronic kidney disease with heart failure and with stage 5 chronic kidney disease, or end stage renal disease: Secondary | ICD-10-CM | POA: Diagnosis not present

## 2022-08-18 DIAGNOSIS — R6521 Severe sepsis with septic shock: Secondary | ICD-10-CM | POA: Diagnosis not present

## 2022-08-18 DIAGNOSIS — M869 Osteomyelitis, unspecified: Secondary | ICD-10-CM | POA: Diagnosis not present

## 2022-08-18 DIAGNOSIS — Z91012 Allergy to eggs: Secondary | ICD-10-CM

## 2022-08-18 DIAGNOSIS — G9341 Metabolic encephalopathy: Secondary | ICD-10-CM | POA: Diagnosis present

## 2022-08-18 DIAGNOSIS — J9621 Acute and chronic respiratory failure with hypoxia: Secondary | ICD-10-CM | POA: Diagnosis not present

## 2022-08-18 DIAGNOSIS — J984 Other disorders of lung: Secondary | ICD-10-CM | POA: Diagnosis not present

## 2022-08-18 DIAGNOSIS — E119 Type 2 diabetes mellitus without complications: Secondary | ICD-10-CM

## 2022-08-18 DIAGNOSIS — N2581 Secondary hyperparathyroidism of renal origin: Secondary | ICD-10-CM | POA: Diagnosis not present

## 2022-08-18 DIAGNOSIS — R0989 Other specified symptoms and signs involving the circulatory and respiratory systems: Secondary | ICD-10-CM | POA: Diagnosis not present

## 2022-08-18 DIAGNOSIS — I050 Rheumatic mitral stenosis: Secondary | ICD-10-CM | POA: Diagnosis present

## 2022-08-18 DIAGNOSIS — R0902 Hypoxemia: Secondary | ICD-10-CM | POA: Diagnosis not present

## 2022-08-18 DIAGNOSIS — A419 Sepsis, unspecified organism: Secondary | ICD-10-CM | POA: Diagnosis not present

## 2022-08-18 DIAGNOSIS — Z803 Family history of malignant neoplasm of breast: Secondary | ICD-10-CM

## 2022-08-18 DIAGNOSIS — E11621 Type 2 diabetes mellitus with foot ulcer: Secondary | ICD-10-CM | POA: Diagnosis present

## 2022-08-18 DIAGNOSIS — E662 Morbid (severe) obesity with alveolar hypoventilation: Secondary | ICD-10-CM | POA: Diagnosis present

## 2022-08-18 DIAGNOSIS — K828 Other specified diseases of gallbladder: Secondary | ICD-10-CM | POA: Diagnosis not present

## 2022-08-18 DIAGNOSIS — Z9981 Dependence on supplemental oxygen: Secondary | ICD-10-CM

## 2022-08-18 DIAGNOSIS — Z4682 Encounter for fitting and adjustment of non-vascular catheter: Secondary | ICD-10-CM | POA: Diagnosis not present

## 2022-08-18 DIAGNOSIS — J9600 Acute respiratory failure, unspecified whether with hypoxia or hypercapnia: Secondary | ICD-10-CM | POA: Diagnosis not present

## 2022-08-18 DIAGNOSIS — E1122 Type 2 diabetes mellitus with diabetic chronic kidney disease: Secondary | ICD-10-CM | POA: Diagnosis present

## 2022-08-18 DIAGNOSIS — R945 Abnormal results of liver function studies: Secondary | ICD-10-CM | POA: Diagnosis not present

## 2022-08-18 DIAGNOSIS — E875 Hyperkalemia: Secondary | ICD-10-CM | POA: Diagnosis not present

## 2022-08-18 DIAGNOSIS — K760 Fatty (change of) liver, not elsewhere classified: Secondary | ICD-10-CM | POA: Diagnosis present

## 2022-08-18 DIAGNOSIS — J90 Pleural effusion, not elsewhere classified: Secondary | ICD-10-CM | POA: Diagnosis not present

## 2022-08-18 DIAGNOSIS — Z136 Encounter for screening for cardiovascular disorders: Secondary | ICD-10-CM | POA: Diagnosis not present

## 2022-08-18 DIAGNOSIS — Z7951 Long term (current) use of inhaled steroids: Secondary | ICD-10-CM

## 2022-08-18 DIAGNOSIS — Z833 Family history of diabetes mellitus: Secondary | ICD-10-CM

## 2022-08-18 DIAGNOSIS — K5900 Constipation, unspecified: Secondary | ICD-10-CM | POA: Diagnosis not present

## 2022-08-18 DIAGNOSIS — N3000 Acute cystitis without hematuria: Secondary | ICD-10-CM | POA: Diagnosis not present

## 2022-08-18 DIAGNOSIS — I959 Hypotension, unspecified: Secondary | ICD-10-CM | POA: Diagnosis not present

## 2022-08-18 DIAGNOSIS — F40240 Claustrophobia: Secondary | ICD-10-CM | POA: Diagnosis present

## 2022-08-18 DIAGNOSIS — N719 Inflammatory disease of uterus, unspecified: Secondary | ICD-10-CM | POA: Diagnosis present

## 2022-08-18 DIAGNOSIS — R000 Tachycardia, unspecified: Secondary | ICD-10-CM | POA: Diagnosis not present

## 2022-08-18 DIAGNOSIS — K820 Obstruction of gallbladder: Secondary | ICD-10-CM | POA: Diagnosis not present

## 2022-08-18 DIAGNOSIS — Z5986 Financial insecurity: Secondary | ICD-10-CM

## 2022-08-18 DIAGNOSIS — J9601 Acute respiratory failure with hypoxia: Secondary | ICD-10-CM | POA: Diagnosis not present

## 2022-08-18 DIAGNOSIS — J690 Pneumonitis due to inhalation of food and vomit: Secondary | ICD-10-CM | POA: Diagnosis not present

## 2022-08-18 DIAGNOSIS — Z7982 Long term (current) use of aspirin: Secondary | ICD-10-CM

## 2022-08-18 DIAGNOSIS — J9612 Chronic respiratory failure with hypercapnia: Secondary | ICD-10-CM | POA: Diagnosis not present

## 2022-08-18 DIAGNOSIS — R4182 Altered mental status, unspecified: Secondary | ICD-10-CM | POA: Diagnosis not present

## 2022-08-18 DIAGNOSIS — R403 Persistent vegetative state: Secondary | ICD-10-CM | POA: Diagnosis not present

## 2022-08-18 DIAGNOSIS — I5040 Unspecified combined systolic (congestive) and diastolic (congestive) heart failure: Secondary | ICD-10-CM | POA: Diagnosis not present

## 2022-08-18 DIAGNOSIS — H5702 Anisocoria: Secondary | ICD-10-CM | POA: Diagnosis not present

## 2022-08-18 DIAGNOSIS — I672 Cerebral atherosclerosis: Secondary | ICD-10-CM | POA: Diagnosis not present

## 2022-08-18 DIAGNOSIS — E11649 Type 2 diabetes mellitus with hypoglycemia without coma: Secondary | ICD-10-CM | POA: Diagnosis not present

## 2022-08-18 DIAGNOSIS — J9611 Chronic respiratory failure with hypoxia: Secondary | ICD-10-CM | POA: Diagnosis not present

## 2022-08-18 DIAGNOSIS — T82898A Other specified complication of vascular prosthetic devices, implants and grafts, initial encounter: Secondary | ICD-10-CM | POA: Diagnosis not present

## 2022-08-18 DIAGNOSIS — Z452 Encounter for adjustment and management of vascular access device: Secondary | ICD-10-CM | POA: Diagnosis not present

## 2022-08-18 DIAGNOSIS — Z888 Allergy status to other drugs, medicaments and biological substances status: Secondary | ICD-10-CM

## 2022-08-18 DIAGNOSIS — R7989 Other specified abnormal findings of blood chemistry: Secondary | ICD-10-CM | POA: Diagnosis not present

## 2022-08-18 DIAGNOSIS — M868X7 Other osteomyelitis, ankle and foot: Secondary | ICD-10-CM | POA: Diagnosis present

## 2022-08-18 DIAGNOSIS — I6523 Occlusion and stenosis of bilateral carotid arteries: Secondary | ICD-10-CM | POA: Diagnosis not present

## 2022-08-18 DIAGNOSIS — E1151 Type 2 diabetes mellitus with diabetic peripheral angiopathy without gangrene: Secondary | ICD-10-CM | POA: Diagnosis not present

## 2022-08-18 DIAGNOSIS — G931 Anoxic brain damage, not elsewhere classified: Secondary | ICD-10-CM | POA: Diagnosis not present

## 2022-08-18 DIAGNOSIS — D72829 Elevated white blood cell count, unspecified: Secondary | ICD-10-CM | POA: Diagnosis not present

## 2022-08-18 LAB — COMPREHENSIVE METABOLIC PANEL
ALT: 26 U/L (ref 0–44)
AST: 33 U/L (ref 15–41)
Albumin: 3.2 g/dL — ABNORMAL LOW (ref 3.5–5.0)
Alkaline Phosphatase: 592 U/L — ABNORMAL HIGH (ref 38–126)
Anion gap: 15 (ref 5–15)
BUN: 45 mg/dL — ABNORMAL HIGH (ref 6–20)
CO2: 22 mmol/L (ref 22–32)
Calcium: 7.1 mg/dL — ABNORMAL LOW (ref 8.9–10.3)
Chloride: 96 mmol/L — ABNORMAL LOW (ref 98–111)
Creatinine, Ser: 5.65 mg/dL — ABNORMAL HIGH (ref 0.44–1.00)
GFR, Estimated: 9 mL/min — ABNORMAL LOW (ref >60)
Glucose, Bld: 208 mg/dL — ABNORMAL HIGH (ref 70–99)
Potassium: 3.9 mmol/L (ref 3.5–5.1)
Sodium: 133 mmol/L — ABNORMAL LOW (ref 135–145)
Total Bilirubin: 1.3 mg/dL — ABNORMAL HIGH (ref 0.3–1.2)
Total Protein: 7.4 g/dL (ref 6.5–8.1)

## 2022-08-18 LAB — CBC WITH DIFFERENTIAL/PLATELET
Abs Immature Granulocytes: 0.27 10*3/uL — ABNORMAL HIGH (ref 0.00–0.07)
Basophils Absolute: 0.1 10*3/uL (ref 0.0–0.1)
Basophils Relative: 0 %
Eosinophils Absolute: 0.1 10*3/uL (ref 0.0–0.5)
Eosinophils Relative: 0 %
HCT: 40.6 % (ref 36.0–46.0)
Hemoglobin: 13.1 g/dL (ref 12.0–15.0)
Immature Granulocytes: 2 %
Lymphocytes Relative: 8 %
Lymphs Abs: 1.4 10*3/uL (ref 0.7–4.0)
MCH: 30 pg (ref 26.0–34.0)
MCHC: 32.3 g/dL (ref 30.0–36.0)
MCV: 93.1 fL (ref 80.0–100.0)
Monocytes Absolute: 1 10*3/uL (ref 0.1–1.0)
Monocytes Relative: 6 %
Neutro Abs: 15.7 10*3/uL — ABNORMAL HIGH (ref 1.7–7.7)
Neutrophils Relative %: 84 %
Platelets: 323 10*3/uL (ref 150–400)
RBC: 4.36 MIL/uL (ref 3.87–5.11)
RDW: 19 % — ABNORMAL HIGH (ref 11.5–15.5)
Smear Review: NORMAL
WBC: 18.5 10*3/uL — ABNORMAL HIGH (ref 4.0–10.5)
nRBC: 0.5 % — ABNORMAL HIGH (ref 0.0–0.2)

## 2022-08-18 LAB — TSH: TSH: 2.59 u[IU]/mL (ref 0.350–4.500)

## 2022-08-18 LAB — URINALYSIS, ROUTINE W REFLEX MICROSCOPIC
Bilirubin Urine: NEGATIVE
Glucose, UA: NEGATIVE mg/dL
Ketones, ur: NEGATIVE mg/dL
Nitrite: NEGATIVE
Protein, ur: 100 mg/dL — AB
RBC / HPF: >50 RBC/hpf (ref 0–5)
Specific Gravity, Urine: 1.021 (ref 1.005–1.030)
Squamous Epithelial / HPF: >50 /HPF (ref 0–5)
WBC, UA: >50 WBC/hpf (ref 0–5)
pH: 6 (ref 5.0–8.0)

## 2022-08-18 LAB — GLUCOSE, CAPILLARY: Glucose-Capillary: 131 mg/dL — ABNORMAL HIGH (ref 70–99)

## 2022-08-18 MED ORDER — SODIUM CHLORIDE 0.9 % IV SOLN
1.0000 g | Freq: Once | INTRAVENOUS | Status: AC
  Administered 2022-08-18: 1 g via INTRAVENOUS
  Filled 2022-08-18: qty 10

## 2022-08-18 MED ORDER — ONDANSETRON HCL 4 MG PO TABS: 4.0000 mg | ORAL_TABLET | Freq: Four times a day (QID) | ORAL | Status: DC | PRN

## 2022-08-18 MED ORDER — VITAMIN D 25 MCG (1000 UNIT) PO TABS
1000.0000 [IU] | ORAL_TABLET | Freq: Every day | ORAL | Status: DC
  Administered 2022-08-19 – 2022-08-27 (×9): 1000 [IU] via ORAL
  Filled 2022-08-18 (×9): qty 1

## 2022-08-18 MED ORDER — ALBUTEROL SULFATE (2.5 MG/3ML) 0.083% IN NEBU
2.5000 mg | INHALATION_SOLUTION | Freq: Four times a day (QID) | RESPIRATORY_TRACT | Status: DC | PRN
  Administered 2022-08-19 – 2022-08-25 (×6): 2.5 mg via RESPIRATORY_TRACT
  Filled 2022-08-18 (×5): qty 3

## 2022-08-18 MED ORDER — OXYBUTYNIN CHLORIDE 5 MG PO TABS: 5.0000 mg | ORAL_TABLET | Freq: Three times a day (TID) | ORAL | Status: DC | PRN

## 2022-08-18 MED ORDER — MORPHINE SULFATE (PF) 4 MG/ML IV SOLN
4.0000 mg | Freq: Once | INTRAVENOUS | Status: AC
  Administered 2022-08-18: 4 mg via INTRAMUSCULAR
  Filled 2022-08-18: qty 1

## 2022-08-18 MED ORDER — LIDOCAINE-PRILOCAINE 2.5-2.5 % EX CREA: TOPICAL_CREAM | CUTANEOUS | Status: DC | PRN

## 2022-08-18 MED ORDER — HEPARIN SODIUM (PORCINE) 5000 UNIT/ML IJ SOLN
5000.0000 [IU] | Freq: Three times a day (TID) | INTRAMUSCULAR | Status: AC
  Administered 2022-08-18 – 2022-08-20 (×7): 5000 [IU] via SUBCUTANEOUS
  Filled 2022-08-18 (×7): qty 1

## 2022-08-18 MED ORDER — ARFORMOTEROL TARTRATE 15 MCG/2ML IN NEBU
15.0000 ug | INHALATION_SOLUTION | Freq: Two times a day (BID) | RESPIRATORY_TRACT | Status: DC
  Administered 2022-08-19 – 2022-09-01 (×25): 15 ug via RESPIRATORY_TRACT
  Filled 2022-08-18 (×29): qty 2

## 2022-08-18 MED ORDER — ASPIRIN 81 MG PO TBEC
81.0000 mg | DELAYED_RELEASE_TABLET | Freq: Every day | ORAL | Status: DC
  Administered 2022-08-19 – 2022-08-27 (×9): 81 mg via ORAL
  Filled 2022-08-18 (×9): qty 1

## 2022-08-18 MED ORDER — MIDODRINE HCL 5 MG PO TABS
20.0000 mg | ORAL_TABLET | Freq: Every day | ORAL | Status: DC | PRN
  Administered 2022-08-19 – 2022-08-20 (×2): 20 mg via ORAL
  Filled 2022-08-18: qty 4

## 2022-08-18 MED ORDER — MONTELUKAST SODIUM 10 MG PO TABS
10.0000 mg | ORAL_TABLET | Freq: Every day | ORAL | Status: DC
  Administered 2022-08-18 – 2022-08-25 (×8): 10 mg via ORAL
  Filled 2022-08-18 (×8): qty 1

## 2022-08-18 MED ORDER — INSULIN GLARGINE-YFGN 100 UNIT/ML ~~LOC~~ SOLN
20.0000 [IU] | Freq: Every day | SUBCUTANEOUS | Status: DC
  Administered 2022-08-19: 20 [IU] via SUBCUTANEOUS
  Filled 2022-08-18 (×2): qty 0.2

## 2022-08-18 MED ORDER — INSULIN DEGLUDEC 100 UNIT/ML ~~LOC~~ SOPN: 20.0000 [IU] | PEN_INJECTOR | Freq: Every day | SUBCUTANEOUS | Status: DC

## 2022-08-18 MED ORDER — FENTANYL CITRATE PF 50 MCG/ML IJ SOSY
12.5000 ug | PREFILLED_SYRINGE | INTRAMUSCULAR | Status: DC | PRN
  Administered 2022-08-18: 12.5 ug via INTRAVENOUS
  Administered 2022-08-19: 50 ug via INTRAVENOUS
  Filled 2022-08-18 (×2): qty 1

## 2022-08-18 MED ORDER — PANTOPRAZOLE SODIUM 40 MG PO TBEC
40.0000 mg | DELAYED_RELEASE_TABLET | Freq: Every day | ORAL | Status: DC
  Administered 2022-08-20 – 2022-08-26 (×6): 40 mg via ORAL
  Filled 2022-08-18 (×8): qty 1

## 2022-08-18 MED ORDER — ESCITALOPRAM OXALATE 10 MG PO TABS
10.0000 mg | ORAL_TABLET | Freq: Every day | ORAL | Status: DC
  Administered 2022-08-19 – 2022-08-26 (×5): 10 mg via ORAL
  Filled 2022-08-18 (×8): qty 1

## 2022-08-18 MED ORDER — ACETAMINOPHEN 500 MG PO TABS
1000.0000 mg | ORAL_TABLET | Freq: Four times a day (QID) | ORAL | Status: DC | PRN
  Administered 2022-08-23: 1000 mg via ORAL
  Filled 2022-08-18 (×2): qty 2

## 2022-08-18 MED ORDER — ATORVASTATIN CALCIUM 20 MG PO TABS
40.0000 mg | ORAL_TABLET | Freq: Every day | ORAL | Status: DC
  Administered 2022-08-20 – 2022-08-21 (×2): 40 mg via ORAL
  Filled 2022-08-18 (×2): qty 2

## 2022-08-18 MED ORDER — PREGABALIN 50 MG PO CAPS
50.0000 mg | ORAL_CAPSULE | Freq: Three times a day (TID) | ORAL | Status: DC | PRN
  Administered 2022-08-18 – 2022-08-23 (×5): 50 mg via ORAL
  Filled 2022-08-18 (×5): qty 1

## 2022-08-18 MED ORDER — ONDANSETRON HCL 4 MG/2ML IJ SOLN
4.0000 mg | Freq: Four times a day (QID) | INTRAMUSCULAR | Status: DC | PRN
  Administered 2022-08-20 – 2022-08-21 (×3): 4 mg via INTRAVENOUS
  Filled 2022-08-18 (×2): qty 2

## 2022-08-18 MED ORDER — BUDESONIDE 0.5 MG/2ML IN SUSP
0.5000 mg | Freq: Two times a day (BID) | RESPIRATORY_TRACT | Status: DC
  Administered 2022-08-19 – 2022-09-01 (×26): 0.5 mg via RESPIRATORY_TRACT
  Filled 2022-08-18 (×27): qty 2

## 2022-08-18 MED ORDER — SEVELAMER CARBONATE 2.4 G PO PACK
2.4000 g | PACK | Freq: Three times a day (TID) | ORAL | Status: DC
  Administered 2022-08-19 – 2022-08-26 (×11): 2.4 g via ORAL
  Filled 2022-08-18 (×24): qty 1

## 2022-08-18 MED ORDER — INSULIN ASPART 100 UNIT/ML IJ SOLN
0.0000 [IU] | Freq: Three times a day (TID) | INTRAMUSCULAR | Status: AC
  Administered 2022-08-19 (×2): 2 [IU] via SUBCUTANEOUS
  Administered 2022-08-19: 3 [IU] via SUBCUTANEOUS
  Administered 2022-08-20: 1 [IU] via SUBCUTANEOUS
  Filled 2022-08-18 (×4): qty 1

## 2022-08-18 MED ORDER — FENTANYL CITRATE PF 50 MCG/ML IJ SOSY
50.0000 ug | PREFILLED_SYRINGE | Freq: Once | INTRAMUSCULAR | Status: AC
  Administered 2022-08-18: 50 ug via INTRAVENOUS
  Filled 2022-08-18: qty 1

## 2022-08-18 NOTE — H&P (Addendum)
History and Physical    [name] XBM:[id number] DOB: 05-26-1970 DOA: 08/18/2022  PCP: Orson Eva, NP  Patient coming from: home  I have personally briefly reviewed patient's old medical records in [facility]  Chief Complaint:   HPI: [name] is a 52 y.o. female with medical history significant of  ESRD on HD T/thurs/Sat ,  with minimal urine out put, Asthma/COPD/OSA/OSH intolerant to bipap,Chronic respiratory failure on home O2 4L,  HTN, Hfpef, anxiety,  DMII, obesity ,chronic wound right foot followed by wound cared. Patient presents to ED with interim hx of dysuria as well as  foul odor associated with her urine. Patient has been seen for this as outpatient and was placed on oral antibiotics. Patient states she has been on abx for close to 2 weeks and still has symptoms. Patient notes that over the last few days she has had increase lower abdominal pain /pressure with radiation towards her flank area.  She also noted increase lethargy as well. She notes no fever ,no chills no diarrhea,no worsening sob, but has chronic sob/doe that has not changed. She denies any chest pressure or persistent cough but had noted some wheezing/rare intermittent cough.She presents to ED due to progression of symptoms despite use of oral antibiotics.   ED Course:  Afeb, bp 106/77, hr 87, rr 19 sat 91% on 4L  NA 133( 130) K 3.9, CL 96, glu 208 , cr 5.65  Alk phos 593  Tbili 1.3  Wbc 18.5, hgb 13.1 , pmn 15.7 UA: >50 wbc, >50 rbc , + bacteria , turbid  CTAB/pelvis IMPRESSION: 1. No CT evidence of acute abdominal/pelvic process. Normal appendix. 2. Enlarged uterus with degenerated fibroid with calcification. 3. Moderate aortic atherosclerosis.  Tx CTX,fentanyl Review of Systems: As per HPI otherwise 10 point review of systems negative.   Past Medical History:  Diagnosis Date   (HFpEF) heart failure with preserved ejection fraction (HCC)    a. 11/2014 Echo: EF 50-55%, Gr1 DD; b.  06/2016 Echo: EF 60-65%, no rwma, mod dil LA, nl RV, triv eff.   Anxiety    Asthma    Bronchitis    CHF (congestive heart failure) (HCC)    Chronic cough    Chronic respiratory failure with hypoxia and hypercapnia (HCC)    a. Home O2.  (2-4L)   CKD (chronic kidney disease), stage III (HCC)    Depression    Diabetes mellitus without complication (HCC)    Diverticulosis    Dyspnea    Dysrhythmia    per pt, no arrythmias   Environmental and seasonal allergies    Extreme obesity with alveolar hypoventilation (HCC)    Hypertension    Iron deficiency anemia    a. chronic blood loss->heavy menses.   Kidney failure    dialysis tues/thur/sat   ... left arm shunt   Orthopnea    Oxygen dependent    3l/min   Pericardial effusion    a. 11/2014 CT Chest: Mod effusion; b. 11/2014 Echo: small to mod circumferential effusion. No hemodynamic compromise.   Pneumonia    history   Poorly controlled diabetes mellitus (HCC)    a. 11/2014 A1c 13.2.   Swelling of both lower extremities     Past Surgical History:  Procedure Laterality Date   A/V FISTULAGRAM Left 06/09/2017   Procedure: A/V FISTULAGRAM;  Surgeon: Annice Needy, MD;  Location: [facility];  Service: Cardiovascular;  Laterality: Left;   A/V FISTULAGRAM Left 06/16/2017  Procedure: A/V FISTULAGRAM;  Surgeon: Annice Needy, MD;  Location: [facility];  Service: Cardiovascular;  Laterality: Left;   A/V FISTULAGRAM Left 01/28/2018   Procedure: A/V FISTULAGRAM;  Surgeon: Annice Needy, MD;  Location: [facility];  Service: Cardiovascular;  Laterality: Left;   AMPUTATION TOE Right 10/21/2020   Procedure: AMPUTATION TOE-5th Digit Amputation Right Foot;  Surgeon: Felecia Shelling, DPM;  Location: [facility];  Service: Podiatry;  Laterality: Right;   AV FISTULA PLACEMENT Left 04/25/2017   Procedure: ARTERIOVENOUS (AV) FISTULA CREATION;  Surgeon: Annice Needy, MD;  Location: [facility];  Service: Vascular;  Laterality: Left;   BONE  BIOPSY Right 04/27/2021   Procedure: Right 4th metatarsal BONE BIOPSY;  Surgeon: Candelaria Stagers, DPM;  Location: [facility];  Service: Podiatry;  Laterality: Right;   CATARACT EXTRACTION W/PHACO Left 01/21/2018   Procedure: CATARACT EXTRACTION PHACO AND INTRAOCULAR LENS PLACEMENT (IOC);  Surgeon: Lockie Mola, MD;  Location: [facility];  Service: Ophthalmology;  Laterality: Left;  [place] 01:55.5 CDE 9.05 EAUP 13.9 Fluid Pack Lot # [id number]   CESAREAN SECTION     times 3   DIALYSIS/PERMA CATHETER INSERTION Right 04/04/2017   Procedure: DIALYSIS/PERMA CATHETER INSERTION;  Surgeon: Renford Dills, MD;  Location: [facility];  Service: Cardiovascular;  Laterality: Right;   DIALYSIS/PERMA CATHETER REMOVAL N/A 09/17/2017   Procedure: DIALYSIS/PERMA CATHETER REMOVAL;  Surgeon: Annice Needy, MD;  Location: [facility];  Service: Cardiovascular;  Laterality: N/A;   INCISION AND DRAINAGE Right 10/26/2020   Procedure: INCISION AND DRAINAGE;  Surgeon: Candelaria Stagers, DPM;  Location: [facility];  Service: Podiatry;  Laterality: Right;   INSERTION OF DIALYSIS CATHETER N/A 04/16/2017   Procedure: INSERTION OF DIALYSIS PERM CATHETER;  Surgeon: Annice Needy, MD;  Location: [facility];  Service: Vascular;  Laterality: N/A;   LOWER EXTREMITY ANGIOGRAPHY Right 10/18/2020   Procedure: Lower Extremity Angiography;  Surgeon: Annice Needy, MD;  Location: [facility];  Service: Cardiovascular;  Laterality: Right;   LOWER EXTREMITY ANGIOGRAPHY Right 04/17/2022   Procedure: Lower Extremity Angiography;  Surgeon: Annice Needy, MD;  Location: [facility];  Service: Cardiovascular;  Laterality: Right;     reports that she has never smoked. She has been exposed to tobacco smoke. She has never used smokeless tobacco. She reports that she does not currently use alcohol. She reports current drug use. Drug: Marijuana.  Allergies  Allergen Reactions   Dust Mite Extract Shortness Of Breath and  Itching   Egg-Derived Products Itching    MAKES TONGUE ITCH but she will eat, only in small amounts   Mold Extract [Trichophyton] Shortness Of Breath and Itching   Pollen Extract Shortness Of Breath    Itching, shortness of breath, asthma like symptoms   Ozempic (0.25 Or 0.5 Mg-Dose) [Semaglutide(0.25 Or 0.5mg -Dos)] Other (See Comments)    pancreatitis   Sulfa Antibiotics Itching   Feraheme [Ferumoxytol] Itching    Uses benadryl so she can get the medicine    Family History  Problem Relation Age of Onset   Diabetes Mother    Hypertension Mother    Hypertension Father    Diabetes Father    Breast cancer Maternal Aunt 20   Breast cancer Maternal Aunt 50   Hypertension Other    Diabetes Other     Prior to Admission medications   Medication Sig Start Date End Date Taking? Authorizing Provider  acetaminophen (TYLENOL) 500 MG tablet  Take 1,000 mg by mouth every 6 (six) hours as needed for mild pain, fever or headache.    [provider]  albuterol (PROVENTIL) (2.5 MG/3ML) 0.083% nebulizer solution Take 3 mLs (2.5 mg total) by nebulization every 6 (six) hours as needed for wheezing or shortness of breath. 02/15/22   Salena Saner, MD  albuterol (VENTOLIN HFA) 108 (90 Base) MCG/ACT inhaler Inhale 2 puffs into the lungs every 6 (six) hours as needed for wheezing or shortness of breath. 02/15/22   Salena Saner, MD  aspirin EC 81 MG tablet Take 81 mg by mouth daily.     [provider]  atorvastatin (LIPITOR) 40 MG tablet Take 1 tablet (40 mg total) by mouth daily. 04/20/22   Regalado, Belkys A, MD  benzonatate (TESSALON PERLES) 100 MG capsule Take 1 capsule (100 mg total) by mouth 3 (three) times daily as needed for cough. 05/22/22 05/22/23  Orson Eva, NP  budesonide (PULMICORT) 0.5 MG/2ML nebulizer solution USE 1 VIAL  IN  NEBULIZER TWICE  DAILY - (Rinse Mouth After Each Treatment) 08/13/22   Orson Eva, NP  clindamycin (CLEOCIN T) 1 % lotion APPLY TO  FACE, ARMS AND LEGS EVENLY NIGHTLY AT BEDTIME 06/27/22   Orson Eva, NP  doxepin (SINEQUAN) 10 MG capsule Take 1 capsule (10 mg total) by mouth at bedtime. 04/24/22   Orson Eva, NP  escitalopram (LEXAPRO) 10 MG tablet Take 1 tablet (10 mg total) by mouth daily. 04/24/22 04/24/23  Orson Eva, NP  ferric citrate (AURYXIA) 1 GM 210 MG(Fe) tablet Take 210 mg by mouth 3 (three) times daily with meals. Only taking once a day most days    [provider]  formoterol (PERFOROMIST) 20 MCG/2ML nebulizer solution USE 1 VIAL  IN  NEBULIZER TWICE  DAILY - (Morning And Evening) 08/13/22   Orson Eva, NP  insulin degludec (TRESIBA) 100 UNIT/ML FlexTouch Pen Inject 20 Units into the skin daily. 04/20/22   Regalado, Belkys A, MD  insulin lispro (HUMALOG) 100 UNIT/ML KwikPen Inject into the skin. 06/12/21   [provider]  lactulose (CHRONULAC) 10 GM/15ML solution Take 30 mLs (20 g total) by mouth daily as needed for severe constipation. 04/26/17   Katha Hamming, MD  lidocaine-prilocaine (EMLA) cream Apply topically as needed (For access for dialysis).    [provider]  LINZESS 290 MCG CAPS capsule TAKE 1 CAPSULE BY MOUTH IN THE MORNING WITH A GLASS OF WATER 30 MINUTES BEFORE BREAKFAST 08/08/22   Miki Kins, FNP  midodrine (PROAMATINE) 10 MG tablet Take 20 mg by mouth daily as needed. 08/23/21   [provider]  montelukast (SINGULAIR) 10 MG tablet Take 1 tablet (10 mg total) by mouth at bedtime. 09/17/21   Salena Saner, MD  Omeprazole Magnesium (PRILOSEC PO)     [provider]  OXYGEN Inhale 2-4 L into the lungs.    [provider]  predniSONE (DELTASONE) 50 MG tablet Take 1 tablet by mouth daily in AM with food x 5 days 04/24/22   Orson Eva, NP  pregabalin (LYRICA) 50 MG capsule TAKE 1 CAPSULE BY MOUTH EVERY 8 HOURS AS NEEDED FOR  NEUROPATHY 07/01/22   Orson Eva, NP  RENVELA 2.4 g PACK Take 2.4 g by mouth 3 (three) times  daily. 08/07/21   [provider]  revefenacin Mikael Spray) 175 MCG/3ML nebulizer solution  01/19/21   [provider]  Vitamin D, Cholecalciferol, 25 MCG (1000 UT) TABS Take 1,000 Units by mouth daily.  [provider]    Physical Exam: Vitals:   08/18/22 1209 08/18/22 1212  BP: 106/77   Pulse: 87   Resp: 19   Temp:  99.1 F (37.3 C)  TempSrc:  Oral  SpO2: 91%     Constitutional: NAD, calm, comfortable Vitals:   08/18/22 1209 08/18/22 1212  BP: 106/77   Pulse: 87   Resp: 19   Temp:  99.1 F (37.3 C)  TempSrc:  Oral  SpO2: 91%    Eyes: PERRL, lids and conjunctivae normal ENMT: Mucous membranes are moist. Posterior pharynx clear of any exudate or lesions.Normal dentition.  Neck: normal, supple, no masses, no thyromegaly Respiratory: clear to auscultation bilaterally, no wheezing, no crackles. Normal respiratory effort. No accessory muscle use.  Cardiovascular: Regular rate and rhythm, no murmurs / rubs / gallops. No extremity edema. Warm extremities,  Abdomen: no tenderness, no masses palpated. No hepatosplenomegaly. Bowel sounds positive.  Musculoskeletal: no clubbing / cyanosis. No joint deformity upper and lower extremities. Good ROM, no contractures. Normal muscle tone.  Skin: no rashes, lesions, ulcers. No induration Neurologic: CN 2-12 grossly intact. Sensation intact, MAEX 4 Psychiatric: Normal judgment and insight. Alert and oriented x 3. Normal mood.    Labs on Admission: I have personally reviewed following labs and imaging studies  CBC: Recent Labs  Lab 08/18/22 1211  WBC 18.5*  NEUTROABS 15.7*  HGB 13.1  HCT 40.6  MCV 93.1  PLT 323   Basic Metabolic Panel: Recent Labs  Lab 08/18/22 1211  NA 133*  K 3.9  CL 96*  CO2 22  GLUCOSE 208*  BUN 45*  CREATININE 5.65*  CALCIUM 7.1*   GFR: CrCl cannot be calculated (Unknown ideal weight.). Liver Function Tests: Recent Labs  Lab 08/18/22 1211  AST 33  ALT 26  ALKPHOS  592*  BILITOT 1.3*  PROT 7.4  ALBUMIN 3.2*   No results for input(s): "LIPASE", "AMYLASE" in the last 168 hours. No results for input(s): "AMMONIA" in the last 168 hours. Coagulation Profile: No results for input(s): "INR", "PROTIME" in the last 168 hours. Cardiac Enzymes: No results for input(s): "CKTOTAL", "CKMB", "CKMBINDEX", "TROPONINI" in the last 168 hours. BNP (last 3 results) No results for input(s): "PROBNP" in the last 8760 hours. HbA1C: No results for input(s): "HGBA1C" in the last 72 hours. CBG: No results for input(s): "GLUCAP" in the last 168 hours. Lipid Profile: No results for input(s): "CHOL", "HDL", "LDLCALC", "TRIG", "CHOLHDL", "LDLDIRECT" in the last 72 hours. Thyroid Function Tests: No results for input(s): "TSH", "T4TOTAL", "FREET4", "T3FREE", "THYROIDAB" in the last 72 hours. Anemia Panel: No results for input(s): "VITAMINB12", "FOLATE", "FERRITIN", "TIBC", "IRON", "RETICCTPCT" in the last 72 hours. Urine analysis:    Component Value Date/Time   COLORURINE YELLOW (A) 08/18/2022 1507   APPEARANCEUR TURBID (A) 08/18/2022 1507   LABSPEC 1.021 08/18/2022 1507   PHURINE 6.0 08/18/2022 1507   GLUCOSEU NEGATIVE 08/18/2022 1507   HGBUR MODERATE (A) 08/18/2022 1507   BILIRUBINUR NEGATIVE 08/18/2022 1507   KETONESUR NEGATIVE 08/18/2022 1507   PROTEINUR 100 (A) 08/18/2022 1507   NITRITE NEGATIVE 08/18/2022 1507   LEUKOCYTESUR MODERATE (A) 08/18/2022 1507    Radiological Exams on Admission: CT ABDOMEN PELVIS WO CONTRAST  Result Date: 08/18/2022 CLINICAL DATA:  Right lower quadrant abdominal pain EXAM: CT ABDOMEN AND PELVIS WITHOUT CONTRAST TECHNIQUE: Multidetector CT imaging of the abdomen and pelvis was performed following the standard protocol without IV contrast. RADIATION DOSE REDUCTION: This exam was performed according to the departmental dose-optimization program  which includes automated exposure control, adjustment of the mA and/or kV according to patient  size and/or use of iterative reconstruction technique. COMPARISON:  CT examination dated April 24, 2021 FINDINGS: Lower chest: Subsegmental atelectasis in the right lower lobe. No acute abnormality Hepatobiliary: No focal liver abnormality is seen. No gallstones, gallbladder wall thickening, or biliary dilatation. Pancreas: Unremarkable. No pancreatic ductal dilatation or surrounding inflammatory changes. Spleen: Normal in size without focal abnormality. Adrenals/Urinary Tract: Adrenal glands are unremarkable. Kidneys are normal, without renal calculi, focal lesion, or hydronephrosis. Bladder is unremarkable. Stomach/Bowel: Stomach is within normal limits. Appendix appears normal. No evidence of bowel wall thickening, distention, or inflammatory changes. Vascular/Lymphatic: Moderate aortic atherosclerosis. No enlarged abdominal or pelvic lymph nodes. Reproductive: Enlarged uterus with degenerated fibroid with calcification. Bilateral adnexa are unremarkable. Other: No abdominal wall hernia or abnormality. No abdominopelvic ascites. Musculoskeletal: No acute or significant osseous findings. IMPRESSION: 1. No CT evidence of acute abdominal/pelvic process. Normal appendix. 2. Enlarged uterus with degenerated fibroid with calcification. 3. Moderate aortic atherosclerosis. Electronically Signed   By: Larose Hires D.O.   On: 08/18/2022 14:38    EKG: Independently reviewed. N/a  Assessment/Plan  UTI  - in patient on ESRD with minimal urine out put  - admit to tele  - continue ctx  -f/u on urine culture  -supportive care for urinary pain   -trend inflammatory markers  Chronic wound right foot  -hx of osteo - xray of right foot pending  -wound care to follow   ESRD on HD T/thurs/Sat  -renal consult for further assistance   Alkphos elevated  -RUQ u/s pending  - patient noted no current n/v  -no RUQ tenderness on exam  -continue to monitor labs   Enlarged uterus with degenerated fibroid   -possible cause of pain  - will f/u with pelvic US , consider pelvic mri w/o contrast based on results -further evaluation /consults based on results   Chronic respiratory failure on home O2 -in setting of pulmonary htn,OSH/OSA -continue with chronic O2 -non complaint with cpap  Asthma -no acute exacerbation  -continue home controller medications  - continue on home O2    Pulmonary HTN  -echo noted right sided pressures>70 -in setting mitral valve stenosis , OSH -weight loss recommended by cardiology at last visit -continue on home O2 on at 4L -follow with pulmonary     HTN -continue home regimen  -on midodrine with HD   Hfpef -fluid managed with HD  Anxiety -resume home regimen    DMII -iss/fs  -resume home regimen once med rec completed   DVT prophylaxis: heparin Code Status: full/ as discussed per patient wishes in event of cardiac arrest  Family Communication: none at bedside Disposition Plan: patient  expected to be admitted greater than 2 midnights  Consults called: renal  Admission status: med tele   Lurline Del MD [facility]   If 7PM-7AM, please contact night-coverage [email or web address] Password [facility]  08/18/2022, 4:56 PM

## 2022-08-18 NOTE — ED Notes (Signed)
Pt used restroom. Pt had thick green/brown foul smelling substance in hat. Pt unsure which area it came out of it (vagina or urethra). Pt says it just drips out for past several weeks.

## 2022-08-18 NOTE — ED Provider Notes (Signed)
[facility] Provider Note    Event Date/Time   First MD Initiated Contact with Patient 08/18/22 1311     (approximate)  History   Chief Complaint: Abdominal Pain and Dysuria  HPI  [name] is a 52 y.o. female with a past medical history of CHF, ESRD on HD, diabetes, asthma on 4 L chronically, presents to the emergency department for right lower quadrant abdominal pain and fatigue for the past 2 weeks..  According to the patient for the past 2 to 3 weeks now she has been experiencing lower abdominal pain mostly in the right lower quadrant.  States she has been experiencing burning with urination with a very foul odor to her urine.  States she only urinates approximately once a day as she is on hemodialysis.  Last hemodialysis session was yesterday.  Physical Exam   Triage Vital Signs: ED Triage Vitals  Enc Vitals Group     BP 08/18/22 1209 106/77     Pulse Rate 08/18/22 1209 87     Resp 08/18/22 1209 19     Temp 08/18/22 1212 99.1 F (37.3 C)     Temp Source 08/18/22 1212 Oral     SpO2 08/18/22 1209 91 %     Weight --      Height --      Head Circumference --      Peak Flow --      Pain Score 08/18/22 1207 7     Pain Loc --      Pain Edu? --      Excl. in GC? --     Most recent vital signs: Vitals:   08/18/22 1209 08/18/22 1212  BP: 106/77   Pulse: 87   Resp: 19   Temp:  99.1 F (37.3 C)  SpO2: 91%     General: Awake, no distress.  CV:  Good peripheral perfusion.  Regular rate and rhythm  Resp:  Normal effort.  Equal breath sounds bilaterally.  Abd:  No distention.  Soft, mild right lower quadrant and suprapubic tenderness. Other:  Fairly significant appearing ulceration to the plantar aspect of the right foot.  Patient states this is improved from how it has looked prior and follows up at [facility] wound care.  ED Results / Procedures / Treatments   MEDICATIONS ORDERED IN ED: Medications - No data to display   IMPRESSION /  MDM / ASSESSMENT AND PLAN / ED COURSE  I reviewed the triage vital signs and the nursing notes.  [name] presentation is most consistent with acute presentation with potential threat to life or bodily function.  Patient presents emergency department for lower abdominal pain for the last 2 to 3 weeks foul-smelling urine and dysuria.  Patient states she called her doctor who called her in a course of antibiotics that she has completed the course of antibiotics but continues to have the same symptoms.  Patient denies any fever but states she takes Tylenol regularly throughout the day.  Denies any vomiting or significant diarrhea.  She is on hemodialysis was last dialyzed yesterday urinates approximately 1 small amount per day.  [name] lab work shows a CBC with leukocytosis of 18,500 concerning for possible infection.  Chemistry shows no significant or concerning finding compared to her baseline.  Patient does have a fairly significant ulceration to her right foot however she states the ulceration is actually looking improved and follows up at [facility] wound care for this.  [name] urinalysis is concerning for  significant urinary tract infection greater than 50 red cells white cells many bacteria and white blood cell clumps.  Given the patient's increased weakness significant leukocytosis significant urinary tract infection we will admit the patient for IV antibiotics.  Patient agreeable to plan of care.  FINAL CLINICAL IMPRESSION(S) / ED DIAGNOSES   Lower abdominal pain Weakness Urinary tract infection Note:  This document was prepared using Dragon voice recognition software and may include unintentional dictation errors.   [name], MD 08/19/22 2256

## 2022-08-18 NOTE — ED Triage Notes (Addendum)
Pt co of lower right abd pain/right flank pain for 2 1/2 weeks. Pt states she does not usually pee due to dialysis but she does dribble urine with movement and says it smells "horrible" and burns. Pt is on 4L [place] baseline. Pt states she gets dialysis T/thurs/Sat, pt states she went yesterday.

## 2022-08-18 NOTE — ED Notes (Signed)
Advised nurse that patient has ready bed 

## 2022-08-19 ENCOUNTER — Inpatient Hospital Stay: Payer: 59

## 2022-08-19 DIAGNOSIS — N186 End stage renal disease: Secondary | ICD-10-CM

## 2022-08-19 DIAGNOSIS — D259 Leiomyoma of uterus, unspecified: Secondary | ICD-10-CM | POA: Diagnosis not present

## 2022-08-19 DIAGNOSIS — N3000 Acute cystitis without hematuria: Secondary | ICD-10-CM | POA: Diagnosis not present

## 2022-08-19 LAB — CBC
HCT: 43 % (ref 36.0–46.0)
Hemoglobin: 13.4 g/dL (ref 12.0–15.0)
MCH: 29.7 pg (ref 26.0–34.0)
MCHC: 31.2 g/dL (ref 30.0–36.0)
MCV: 95.3 fL (ref 80.0–100.0)
Platelets: 273 10*3/uL (ref 150–400)
RBC: 4.51 MIL/uL (ref 3.87–5.11)
RDW: 19.3 % — ABNORMAL HIGH (ref 11.5–15.5)
WBC: 17.3 10*3/uL — ABNORMAL HIGH (ref 4.0–10.5)
nRBC: 0.7 % — ABNORMAL HIGH (ref 0.0–0.2)

## 2022-08-19 LAB — GLUCOSE, CAPILLARY
Glucose-Capillary: 159 mg/dL — ABNORMAL HIGH (ref 70–99)
Glucose-Capillary: 161 mg/dL — ABNORMAL HIGH (ref 70–99)
Glucose-Capillary: 205 mg/dL — ABNORMAL HIGH (ref 70–99)
Glucose-Capillary: 144 mg/dL — ABNORMAL HIGH (ref 70–99)

## 2022-08-19 LAB — HEPATITIS B SURFACE ANTIGEN: Hepatitis B Surface Ag: NONREACTIVE

## 2022-08-19 LAB — COMPREHENSIVE METABOLIC PANEL
ALT: 26 U/L (ref 0–44)
AST: 36 U/L (ref 15–41)
Albumin: 3.3 g/dL — ABNORMAL LOW (ref 3.5–5.0)
Alkaline Phosphatase: 593 U/L — ABNORMAL HIGH (ref 38–126)
Anion gap: 18 — ABNORMAL HIGH (ref 5–15)
BUN: 52 mg/dL — ABNORMAL HIGH (ref 6–20)
CO2: 20 mmol/L — ABNORMAL LOW (ref 22–32)
Calcium: 7.3 mg/dL — ABNORMAL LOW (ref 8.9–10.3)
Chloride: 94 mmol/L — ABNORMAL LOW (ref 98–111)
Creatinine, Ser: 6.25 mg/dL — ABNORMAL HIGH (ref 0.44–1.00)
GFR, Estimated: 8 mL/min — ABNORMAL LOW (ref >60)
Glucose, Bld: 160 mg/dL — ABNORMAL HIGH (ref 70–99)
Potassium: 4.5 mmol/L (ref 3.5–5.1)
Sodium: 132 mmol/L — ABNORMAL LOW (ref 135–145)
Total Bilirubin: 1.4 mg/dL — ABNORMAL HIGH (ref 0.3–1.2)
Total Protein: 7.8 g/dL (ref 6.5–8.1)

## 2022-08-19 LAB — HEMOGLOBIN A1C
Hgb A1c MFr Bld: 7.8 % — ABNORMAL HIGH (ref 4.8–5.6)
Mean Plasma Glucose: 177.16 mg/dL

## 2022-08-19 MED ORDER — ALTEPLASE 2 MG IJ SOLR: 2.0000 mg | Freq: Once | INTRAMUSCULAR | Status: DC | PRN

## 2022-08-19 MED ORDER — LIDOCAINE HCL (PF) 1 % IJ SOLN: 5.0000 mL | INTRAMUSCULAR | Status: DC | PRN

## 2022-08-19 MED ORDER — SODIUM CHLORIDE 0.9 % IV SOLN
1.0000 g | INTRAVENOUS | Status: DC
  Administered 2022-08-19 – 2022-08-20 (×2): 1 g via INTRAVENOUS
  Filled 2022-08-19: qty 1
  Filled 2022-08-19 (×2): qty 10

## 2022-08-19 MED ORDER — OXYCODONE HCL 5 MG PO TABS
5.0000 mg | ORAL_TABLET | ORAL | Status: DC | PRN
  Administered 2022-08-19 – 2022-08-24 (×12): 5 mg via ORAL
  Filled 2022-08-19 (×14): qty 1

## 2022-08-19 MED ORDER — FENTANYL CITRATE PF 50 MCG/ML IJ SOSY
25.0000 ug | PREFILLED_SYRINGE | INTRAMUSCULAR | Status: DC | PRN
  Administered 2022-08-19: 25 ug via INTRAVENOUS
  Filled 2022-08-19 (×2): qty 1

## 2022-08-19 MED ORDER — PENTAFLUOROPROP-TETRAFLUOROETH EX AERO: 1.0000 | INHALATION_SPRAY | CUTANEOUS | Status: DC | PRN

## 2022-08-19 MED ORDER — ANTICOAGULANT SODIUM CITRATE 4% (200MG/5ML) IV SOLN: 5.0000 mL | Status: DC | PRN

## 2022-08-19 MED ORDER — HEPARIN SODIUM (PORCINE) 1000 UNIT/ML DIALYSIS: 1000.0000 [IU] | INTRAMUSCULAR | Status: DC | PRN

## 2022-08-19 MED ORDER — CHLORHEXIDINE GLUCONATE CLOTH 2 % EX PADS
6.0000 | MEDICATED_PAD | Freq: Every day | CUTANEOUS | Status: DC
  Administered 2022-08-20 – 2022-08-25 (×6): 6 via TOPICAL

## 2022-08-19 NOTE — Progress Notes (Signed)
[facility] PROGRESS NOTE   [name] MVH:[id number] DOB: 1971-02-10 DOA: 08/18/2022  PCP: Orson Eva, NP  Brief History/Interval Summary:  52 y.o. female with medical history significant of  ESRD on HD T/thurs/Sat, with minimal urine out put, Asthma/COPD/OSA/OSH intolerant to bipap, Chronic respiratory failure on home O2 4L,  HTN, Hfpef, anxiety,  DMII, obesity, chronic wound right foot followed by wound care. Patient presented to ED with interim hx of dysuria as well as foul odor associated with her urine. Patient has been seen for this as outpatient and was placed on oral antibiotics. Patient had been on abx for close to 2 weeks and still has symptoms. Patient notes that over the last few days she has had increase lower abdominal pain /pressure with radiation towards her flank area.  UA was noted to be abnormal.  CT scan did not show any acute findings.  Patient was hospitalized as she failed outpatient treatment.    Consultants: Nephrology  Procedures: None    Subjective/Interval History: Patient continues to have right-sided flank pain.  Some nausea but no vomiting.  Denies any shortness of breath or chest pain.    Assessment/Plan:  Urinary tract infection. Patient with ESRD but does make small amount of urine at times.  But it is somewhat unusual for her.  When she noticed that the urine was quite foul-smelling and she was experiencing discomfort. Failed outpatient treatment. UA noted to be significantly abnormal.  CT scan reassuringly does not show any acute findings. Patient started on ceftriaxone.  Follow-up on urine cultures. Noted to be afebrile.  Does have leukocytosis. Right upper quadrant ultrasound does not suggest cholecystitis.  She does have mildly elevated bilirubin.  She does have elevated alkaline phosphatase which could be related to her renal disease.  Chronically elevated per previous levels.  End-stage renal disease on hemodialysis She is  dialyzed on a Tuesday Thursday Saturday schedule.  Last dialyzed on Saturday.  Nephrology notified.  Chronic wound right foot Wound care following.  This is followed by outpatient wound center as well.  Enlarged uterus with a degenerated fibroid Pelvic ultrasound has been ordered.  Could be responsible for some of her symptoms although her symptoms are primarily in the flank area.  Follow-up on ultrasound report.  Chronic respiratory failure with hypoxia This is in the setting of pulmonary hypertension and obesity hypoventilation syndrome. On oxygen at 4 L/min at home.  Continue.  History of asthma Stable.  Pulmonary hypertension Follows with pulmonology.  Chronic hypotension Noted to be on midodrine.  Low blood pressure noted but she is asymptomatic.  Chronic diastolic CHF Fluid management with hemodialysis.  History of anxiety disorder Continue home medications.  Diabetes mellitus type 2 Continue SSI.  Noted to be on glargine. HbA1c 7.8.  Obesity Estimated body mass index is 45.32 kg/m as calculated from the following:   Height as of 07/15/22: 5\' 4"  (1.626 m).   Weight as of 07/15/22: 119.7 kg.   DVT Prophylaxis: Heparin subcutaneous Code Status: Full code Family Communication: Discussed with patient Disposition Plan: Home when improved.  She tells me that due to her chronic wound in the foot she is currently using a wheelchair to get around.  Status is: Inpatient Remains inpatient appropriate because: Failure of outpatient treatment for UTI.  Requiring IV antibiotics      Medications: Scheduled:  arformoterol  15 mcg Nebulization Q12H   aspirin EC  81 mg Oral Daily   atorvastatin  40 mg Oral Daily  budesonide  0.5 mg Nebulization BID   cholecalciferol  1,000 Units Oral Daily   escitalopram  10 mg Oral Daily   heparin  5,000 Units Subcutaneous Q8H   insulin aspart  0-9 Units Subcutaneous TID WC   insulin glargine-yfgn  20 Units Subcutaneous Daily    montelukast  10 mg Oral QHS   pantoprazole  40 mg Oral Daily   sevelamer carbonate  2.4 g Oral TID WC   Continuous:  cefTRIAXone (ROCEPHIN)  IV     FAO:ZHYQMVHQIONGE, albuterol, fentaNYL (SUBLIMAZE) injection, lidocaine-prilocaine, midodrine, ondansetron **OR** ondansetron (ZOFRAN) IV, oxybutynin, oxyCODONE, pregabalin  Antibiotics: Anti-infectives (From admission, onward)    Start     Dose/Rate Route Frequency Ordered Stop   08/19/22 1600  cefTRIAXone (ROCEPHIN) 1 g in sodium chloride 0.9 % 100 mL IVPB        1 g 200 mL/hr over 30 Minutes Intravenous Every 24 hours 08/19/22 0845     08/18/22 1600  cefTRIAXone (ROCEPHIN) 1 g in sodium chloride 0.9 % 100 mL IVPB        1 g 200 mL/hr over 30 Minutes Intravenous  Once 08/18/22 1556 08/18/22 1657       Objective:  Vital Signs  Vitals:   08/19/22 0034 08/19/22 0620 08/19/22 0739 08/19/22 0944  BP:  116/70 (!) 100/56 (!) 93/59  Pulse:  96 100 86  Resp:  18 19   Temp:  (!) 97.5 F (36.4 C) 98.2 F (36.8 C)   TempSrc:  Oral Oral   SpO2: 96% 98% 99%    No intake or output data in the 24 hours ending 08/19/22 1021 There were no vitals filed for this visit.  General appearance: Awake alert.  In no distress Resp: Clear to auscultation bilaterally.  Normal effort Cardio: S1-S2 is normal regular.  No S3-S4.  No rubs murmurs or bruit GI: Abdomen is soft.  Tender in the right flank area and right lower abdomen without any rebound rigidity or guarding.  Extremities: Right foot covered with Dressing Neurologic: Alert and oriented x3.  No focal neurological deficits.    Lab Results:  Data Reviewed: I have personally reviewed following labs and reports of the imaging studies  CBC: Recent Labs  Lab 08/18/22 1211 08/19/22 0326  WBC 18.5* 17.3*  NEUTROABS 15.7*  --   HGB 13.1 13.4  HCT 40.6 43.0  MCV 93.1 95.3  PLT 323 273    Basic Metabolic Panel: Recent Labs  Lab 08/18/22 1211 08/19/22 0326  NA 133* 132*  K 3.9 4.5   CL 96* 94*  CO2 22 20*  GLUCOSE 208* 160*  BUN 45* 52*  CREATININE 5.65* 6.25*  CALCIUM 7.1* 7.3*    GFR: CrCl cannot be calculated (Unknown ideal weight.).  Liver Function Tests: Recent Labs  Lab 08/18/22 1211 08/19/22 0326  AST 33 36  ALT 26 26  ALKPHOS 592* 593*  BILITOT 1.3* 1.4*  PROT 7.4 7.8  ALBUMIN 3.2* 3.3*    HbA1C: Recent Labs    08/18/22 1211  HGBA1C 7.8*    CBG: Recent Labs  Lab 08/18/22 2057 08/19/22 0735  GLUCAP 131* 159*     Thyroid Function Tests: Recent Labs    08/18/22 1211  TSH 2.590     Radiology Studies: US Abdomen Limited RUQ (LIVER/GB)  Result Date: 08/18/2022 CLINICAL DATA:  Abnormal liver function tests EXAM: ULTRASOUND ABDOMEN LIMITED RIGHT UPPER QUADRANT COMPARISON:  CT abdomen and pelvis dated 08/18/2022 FINDINGS: Gallbladder: No gallstones or wall thickening visualized. No  sonographic Eulah Pont sign noted by sonographer. Trace pericholecystic free fluid. Common bile duct: Diameter: 3 mm Liver: No focal lesion identified. Subjective diffuse periportal echogenicity. Portal vein is patent on color Doppler imaging with normal direction of blood flow towards the liver. Other: None. IMPRESSION: 1. Subjective diffuse periportal echogenicity, which is nonspecific but can be seen in the setting of hepatitis. 2. Trace pericholecystic free fluid, nonspecific. No sonographic evidence of acute cholecystitis. Electronically Signed   By: Agustin Cree M.D.   On: 08/18/2022 21:04   X-ray chest PA and lateral  Result Date: 08/18/2022 CLINICAL DATA:  Leukocytosis and shortness of breath. EXAM: CHEST - 2 VIEW COMPARISON:  Radiographs 04/24/2022 FINDINGS: Stable cardiomediastinal silhouette. Low lung volumes. Bibasilar atelectasis or infiltrates. No pleural effusion or pneumothorax. IMPRESSION: Bibasilar atelectasis or infiltrates. Electronically Signed   By: Minerva Fester M.D.   On: 08/18/2022 19:02   CT ABDOMEN PELVIS WO CONTRAST  Result Date:  08/18/2022 CLINICAL DATA:  Right lower quadrant abdominal pain EXAM: CT ABDOMEN AND PELVIS WITHOUT CONTRAST TECHNIQUE: Multidetector CT imaging of the abdomen and pelvis was performed following the standard protocol without IV contrast. RADIATION DOSE REDUCTION: This exam was performed according to the departmental dose-optimization program which includes automated exposure control, adjustment of the mA and/or kV according to patient size and/or use of iterative reconstruction technique. COMPARISON:  CT examination dated April 24, 2021 FINDINGS: Lower chest: Subsegmental atelectasis in the right lower lobe. No acute abnormality Hepatobiliary: No focal liver abnormality is seen. No gallstones, gallbladder wall thickening, or biliary dilatation. Pancreas: Unremarkable. No pancreatic ductal dilatation or surrounding inflammatory changes. Spleen: Normal in size without focal abnormality. Adrenals/Urinary Tract: Adrenal glands are unremarkable. Kidneys are normal, without renal calculi, focal lesion, or hydronephrosis. Bladder is unremarkable. Stomach/Bowel: Stomach is within normal limits. Appendix appears normal. No evidence of bowel wall thickening, distention, or inflammatory changes. Vascular/Lymphatic: Moderate aortic atherosclerosis. No enlarged abdominal or pelvic lymph nodes. Reproductive: Enlarged uterus with degenerated fibroid with calcification. Bilateral adnexa are unremarkable. Other: No abdominal wall hernia or abnormality. No abdominopelvic ascites. Musculoskeletal: No acute or significant osseous findings. IMPRESSION: 1. No CT evidence of acute abdominal/pelvic process. Normal appendix. 2. Enlarged uterus with degenerated fibroid with calcification. 3. Moderate aortic atherosclerosis. Electronically Signed   By: Larose Hires D.O.   On: 08/18/2022 14:38       LOS: 1 day   Osvaldo Shipper  [facility] Pager on [email or web address]  08/19/2022, 10:21 AM

## 2022-08-19 NOTE — Consult Note (Signed)
WOC Nurse Consult Note: Reason for Consult:right foot wound, chronic, nonhealing. Patient is followed by the outpatient wound care center for over 1 year. Last appointment with Provider Job Founds III, PA-C was 08/02/22. It is noted by Mr. Larina Bras that the distal foot area is likely to auto-amputate, and that patient is not interested in surgical intervention. Wound type: neuropathic Pressure Injury POA: N/A Measurement: Per 08/02/22 visit with provider:  11.5cm x 7.5cm x 0.3cm Wound bed: Nonviable distal foot, red, moist medial foot Drainage (amount, consistency, odor) moderate serous from medial foot Periwound: intact Dressing procedure/placement/frequency: I will continue the POC initiated and overseen by Provider Job Founds, III, PA-C using a daily povidone iodine painting of the distal foot and allowing it to air dry before a dry dressing is applied and using a silver hydrofiber (Aquacel Ag+ Advantage) to the medial wound. This is to be covered with an ABD pad and secured with Kerlix roll gauze/paper tape.  WOC nursing team will not follow, but will remain available to this patient, the nursing and medical teams.  Please re-consult if needed.  Thank you for inviting [place] to participate in this patient's Plan of Care.  Ladona Mow, MSN, RN, CNS, GNP, Leda Min, [employer], [employer] phone:  [phone]

## 2022-08-19 NOTE — Progress Notes (Signed)
Egg allergy removed as requested per patient, stated has not had any reactions tosame since adulthood.

## 2022-08-19 NOTE — Progress Notes (Signed)
[facility]  ROUNDING NOTE   Subjective:   [name] is a 52 year old African-American female with a past medical history of end-stage renal disease, patient is on Tuesday Thursday Saturday schedule, diabetes mellitus, history of pancreatitis who came to the ER with chief complaint of abdominal pain with foul smelling urine for 2 weeks.  Patient is known to our clinic and receives outpatient dialysis treatments at [facility] on a TTS schedule, supervised by Dr Thedore Mins. Last treatment received on Saturday. Patient states she began having abd pain and lower back pain. Denies fever and chills. Remains on 4L [place]  Labs on ED arrival unremarkable for renal patient. UA appears turbid with leukocytes. Urine culture pending. CT abd/pelvis shows enlarged uterus with fibroids  and  moderate aortic atheronecrosis.  We have been consulted to continue dialysis during this admission.    Objective:  Vital signs in last 24 hours:  Temp:  [97.5 F (36.4 C)-99.1 F (37.3 C)] 98.2 F (36.8 C) (06/24 0739) Pulse Rate:  [84-100] 86 (06/24 0944) Resp:  [16-20] 19 (06/24 0739) BP: (84-116)/(56-77) 93/59 (06/24 0944) SpO2:  [91 %-99 %] 99 % (06/24 0739)  Weight change:  There were no vitals filed for this visit.  Intake/Output: No intake/output data recorded.   Intake/Output this shift:  No intake/output data recorded.  Physical Exam: General: NAD  Head: Normocephalic, atraumatic. Moist oral mucosal membranes  Eyes: Anicteric  Lungs:  Clear to auscultation  Heart: Regular rate and rhythm  Abdomen:  Soft, nontender  Extremities:  No peripheral edema.  Neurologic: Nonfocal, moving all four extremities  Skin: Chronic right foot wound  Access: Lt AVF     Basic Metabolic Panel: Recent Labs  Lab 08/18/22 1211 08/19/22 0326  NA 133* 132*  K 3.9 4.5  CL 96* 94*  CO2 22 20*  GLUCOSE 208* 160*  BUN 45* 52*  CREATININE 5.65* 6.25*  CALCIUM 7.1* 7.3*    Liver Function  Tests: Recent Labs  Lab 08/18/22 1211 08/19/22 0326  AST 33 36  ALT 26 26  ALKPHOS 592* 593*  BILITOT 1.3* 1.4*  PROT 7.4 7.8  ALBUMIN 3.2* 3.3*   No results for input(s): "LIPASE", "AMYLASE" in the last 168 hours. No results for input(s): "AMMONIA" in the last 168 hours.  CBC: Recent Labs  Lab 08/18/22 1211 08/19/22 0326  WBC 18.5* 17.3*  NEUTROABS 15.7*  --   HGB 13.1 13.4  HCT 40.6 43.0  MCV 93.1 95.3  PLT 323 273    Cardiac Enzymes: No results for input(s): "CKTOTAL", "CKMB", "CKMBINDEX", "TROPONINI" in the last 168 hours.  BNP: Invalid input(s): "POCBNP"  CBG: Recent Labs  Lab 08/18/22 2057 08/19/22 0735  GLUCAP 131* 159*    Microbiology: Results for orders placed or performed during the hospital encounter of 04/15/22  Culture, blood (Routine x 2)     Status: None   Collection Time: 04/15/22  4:54 PM   Specimen: BLOOD  Result Value Ref Range Status   Specimen Description BLOOD LEFT ANTECUBITAL  Final   Special Requests   Final    BOTTLES DRAWN AEROBIC AND ANAEROBIC Blood Culture adequate volume   Culture   Final    NO GROWTH 5 DAYS Performed at [facility], [address], [place], [place] [address]    Report Status 04/20/2022 FINAL  Final  Culture, blood (Routine x 2)     Status: None   Collection Time: 04/15/22  7:24 PM   Specimen: BLOOD  Result  Value Ref Range Status   Specimen Description BLOOD RIGHT ANTECUBITAL  Final   Special Requests   Final    BOTTLES DRAWN AEROBIC AND ANAEROBIC Blood Culture adequate volume   Culture   Final    NO GROWTH 5 DAYS Performed at [facility], [address] [place], [place] [address]    Report Status 04/20/2022 FINAL  Final  Resp panel by RT-PCR (RSV, Flu A&B, Covid) Anterior Nasal Swab     Status: None   Collection Time: 04/15/22  7:25 PM   Specimen: Anterior Nasal Swab  Result Value Ref Range Status   SARS Coronavirus 2 by RT PCR NEGATIVE NEGATIVE Final    Comment:  (NOTE) SARS-CoV-2 target nucleic acids are NOT DETECTED.  The SARS-CoV-2 RNA is generally detectable in upper respiratory specimens during the acute phase of infection. The lowest concentration of SARS-CoV-2 viral copies this assay can detect is 138 copies/mL. A negative result does not preclude SARS-Cov-2 infection and should not be used as the sole basis for treatment or other patient management decisions. A negative result may occur with  improper specimen collection/handling, submission of specimen other than nasopharyngeal swab, presence of viral mutation(s) within the areas targeted by this assay, and inadequate number of viral copies(<138 copies/mL). A negative result must be combined with clinical observations, patient history, and epidemiological information. The expected result is Negative.  Fact Sheet for Patients:  BloggerCourse.com  Fact Sheet for Healthcare Providers:  SeriousBroker.it  This test is no t yet approved or cleared by the [place] FDA and  has been authorized for detection and/or diagnosis of SARS-CoV-2 by FDA under an Emergency Use Authorization (EUA). This EUA will remain  in effect (meaning this test can be used) for the duration of the COVID-19 declaration under Section 564(b)(1) of the Act, 21 U.S.C.section 360bbb-3(b)(1), unless the authorization is terminated  or revoked sooner.       Influenza A by PCR NEGATIVE NEGATIVE Final   Influenza B by PCR NEGATIVE NEGATIVE Final    Comment: (NOTE) The Xpert Xpress SARS-CoV-2/FLU/RSV plus assay is intended as an aid in the diagnosis of influenza from Nasopharyngeal swab specimens and should not be used as a sole basis for treatment. Nasal washings and aspirates are unacceptable for Xpert Xpress SARS-CoV-2/FLU/RSV testing.  Fact Sheet for Patients: BloggerCourse.com  Fact Sheet for Healthcare  Providers: SeriousBroker.it  This test is not yet approved or cleared by the [place] FDA and has been authorized for detection and/or diagnosis of SARS-CoV-2 by FDA under an Emergency Use Authorization (EUA). This EUA will remain in effect (meaning this test can be used) for the duration of the COVID-19 declaration under Section 564(b)(1) of the Act, 21 U.S.C. section 360bbb-3(b)(1), unless the authorization is terminated or revoked.     Resp Syncytial Virus by PCR NEGATIVE NEGATIVE Final    Comment: (NOTE) Fact Sheet for Patients: BloggerCourse.com  Fact Sheet for Healthcare Providers: SeriousBroker.it  This test is not yet approved or cleared by the [place] FDA and has been authorized for detection and/or diagnosis of SARS-CoV-2 by FDA under an Emergency Use Authorization (EUA). This EUA will remain in effect (meaning this test can be used) for the duration of the COVID-19 declaration under Section 564(b)(1) of the Act, 21 U.S.C. section 360bbb-3(b)(1), unless the authorization is terminated or revoked.  Performed at [facility], [address], [place], [place] [address]   Aerobic Culture w Gram Stain (superficial specimen)     Status: None  Collection Time: 04/18/22  5:45 PM   Specimen: Cervix, Endocervical  Result Value Ref Range Status   Specimen Description   Final    ENDOCERVICAL Performed at [facility], [address] [place], [place] [address]    Special Requests   Final    NONE ENDOCERVICAL Performed at [facility], [address] [place], [place] [address]    Gram Stain NO WBC SEEN NO ORGANISMS SEEN   Final   Culture   Final    NO GROWTH 2 DAYS Performed at [facility], [address] [place], [place] [address]    Report Status 04/21/2022 FINAL  Final    Coagulation Studies: No results for input(s): "LABPROT", "INR" in the  last 72 hours.  Urinalysis: Recent Labs    08/18/22 1507  COLORURINE YELLOW*  LABSPEC 1.021  PHURINE 6.0  GLUCOSEU NEGATIVE  HGBUR MODERATE*  BILIRUBINUR NEGATIVE  KETONESUR NEGATIVE  PROTEINUR 100*  NITRITE NEGATIVE  LEUKOCYTESUR MODERATE*      Imaging: US Abdomen Limited RUQ (LIVER/GB)  Result Date: 08/18/2022 CLINICAL DATA:  Abnormal liver function tests EXAM: ULTRASOUND ABDOMEN LIMITED RIGHT UPPER QUADRANT COMPARISON:  CT abdomen and pelvis dated 08/18/2022 FINDINGS: Gallbladder: No gallstones or wall thickening visualized. No sonographic Murphy sign noted by sonographer. Trace pericholecystic free fluid. Common bile duct: Diameter: 3 mm Liver: No focal lesion identified. Subjective diffuse periportal echogenicity. Portal vein is patent on color Doppler imaging with normal direction of blood flow towards the liver. Other: None. IMPRESSION: 1. Subjective diffuse periportal echogenicity, which is nonspecific but can be seen in the setting of hepatitis. 2. Trace pericholecystic free fluid, nonspecific. No sonographic evidence of acute cholecystitis. Electronically Signed   By: Agustin Cree M.D.   On: 08/18/2022 21:04   X-ray chest PA and lateral  Result Date: 08/18/2022 CLINICAL DATA:  Leukocytosis and shortness of breath. EXAM: CHEST - 2 VIEW COMPARISON:  Radiographs 04/24/2022 FINDINGS: Stable cardiomediastinal silhouette. Low lung volumes. Bibasilar atelectasis or infiltrates. No pleural effusion or pneumothorax. IMPRESSION: Bibasilar atelectasis or infiltrates. Electronically Signed   By: Minerva Fester M.D.   On: 08/18/2022 19:02   CT ABDOMEN PELVIS WO CONTRAST  Result Date: 08/18/2022 CLINICAL DATA:  Right lower quadrant abdominal pain EXAM: CT ABDOMEN AND PELVIS WITHOUT CONTRAST TECHNIQUE: Multidetector CT imaging of the abdomen and pelvis was performed following the standard protocol without IV contrast. RADIATION DOSE REDUCTION: This exam was performed according to the  departmental dose-optimization program which includes automated exposure control, adjustment of the mA and/or kV according to patient size and/or use of iterative reconstruction technique. COMPARISON:  CT examination dated April 24, 2021 FINDINGS: Lower chest: Subsegmental atelectasis in the right lower lobe. No acute abnormality Hepatobiliary: No focal liver abnormality is seen. No gallstones, gallbladder wall thickening, or biliary dilatation. Pancreas: Unremarkable. No pancreatic ductal dilatation or surrounding inflammatory changes. Spleen: Normal in size without focal abnormality. Adrenals/Urinary Tract: Adrenal glands are unremarkable. Kidneys are normal, without renal calculi, focal lesion, or hydronephrosis. Bladder is unremarkable. Stomach/Bowel: Stomach is within normal limits. Appendix appears normal. No evidence of bowel wall thickening, distention, or inflammatory changes. Vascular/Lymphatic: Moderate aortic atherosclerosis. No enlarged abdominal or pelvic lymph nodes. Reproductive: Enlarged uterus with degenerated fibroid with calcification. Bilateral adnexa are unremarkable. Other: No abdominal wall hernia or abnormality. No abdominopelvic ascites. Musculoskeletal: No acute or significant osseous findings. IMPRESSION: 1. No CT evidence of acute abdominal/pelvic process. Normal appendix. 2. Enlarged uterus with degenerated fibroid with calcification. 3. Moderate aortic atherosclerosis.  Electronically Signed   By: Larose Hires D.O.   On: 08/18/2022 14:38     Medications:    cefTRIAXone (ROCEPHIN)  IV      arformoterol  15 mcg Nebulization Q12H   aspirin EC  81 mg Oral Daily   atorvastatin  40 mg Oral Daily   budesonide  0.5 mg Nebulization BID   cholecalciferol  1,000 Units Oral Daily   escitalopram  10 mg Oral Daily   heparin  5,000 Units Subcutaneous Q8H   insulin aspart  0-9 Units Subcutaneous TID WC   insulin glargine-yfgn  20 Units Subcutaneous Daily   montelukast  10 mg Oral QHS    pantoprazole  40 mg Oral Daily   sevelamer carbonate  2.4 g Oral TID WC   acetaminophen, albuterol, fentaNYL (SUBLIMAZE) injection, lidocaine-prilocaine, midodrine, ondansetron **OR** ondansetron (ZOFRAN) IV, oxybutynin, oxyCODONE, pregabalin  Assessment/ Plan:  [name] is a 52 y.o.  female with a past medical history of end-stage renal disease, patient is on Tuesday Thursday Saturday schedule, diabetes mellitus, history of pancreatitis who came to the ER with chief complaint of abdominal pain with foul smelling urine for 2 weeks.  [facility] TTS Left AVF 127.5kg   End stage renal disease on hemodialysis. Last treatment completed on Saturday. Will order Hepatitis B ag/ab. Next treatment scheduled for Tuesday  2. Anemia of chronic kidney disease Lab Results  Component Value Date   HGB 13.4 08/19/2022    Hgb within desired range. No need for ESA  3. Secondary Hyperparathyroidism: with outpatient labs: PTH 2361, phosphorus 9.0, calcium 8.4 on 08/06/22.   Lab Results  Component Value Date   PTH 134 (H) 04/21/2017   CALCIUM 7.3 (L) 08/19/2022   CAION 0.81 (LL) 10/26/2020   PHOS 8.2 (H) 04/20/2022    Patient prescribed cholecalciferol outpatient.  Bone minerals not acceptable stated values.  Patient received sevelamer outpatient with meals.  4. Diabetes mellitus type II with chronic kidney disease/renal manifestations: noninsulin dependent. Most recent hemoglobin A1c is 7.8 on 08/18/22.    LOS: 1 Jedaiah Rathbun 6/24/202410:08 AM

## 2022-08-19 NOTE — Discharge Planning (Signed)
ESTABLISHED HEMODIALYSIS Outpatient Facility [facility] [phone] [place] [address] [phone]  Scheduled days: Tuesday Thursday and Saturday   Treatment time: 06:30am

## 2022-08-20 DIAGNOSIS — N186 End stage renal disease: Secondary | ICD-10-CM | POA: Diagnosis not present

## 2022-08-20 DIAGNOSIS — D259 Leiomyoma of uterus, unspecified: Secondary | ICD-10-CM | POA: Diagnosis not present

## 2022-08-20 DIAGNOSIS — N3000 Acute cystitis without hematuria: Secondary | ICD-10-CM | POA: Diagnosis not present

## 2022-08-20 LAB — CBC
HCT: 43.5 % (ref 36.0–46.0)
Hemoglobin: 13.5 g/dL (ref 12.0–15.0)
MCH: 29.3 pg (ref 26.0–34.0)
MCHC: 31 g/dL (ref 30.0–36.0)
MCV: 94.6 fL (ref 80.0–100.0)
Platelets: 305 10*3/uL (ref 150–400)
RBC: 4.6 MIL/uL (ref 3.87–5.11)
RDW: 18.7 % — ABNORMAL HIGH (ref 11.5–15.5)
WBC: 21.4 10*3/uL — ABNORMAL HIGH (ref 4.0–10.5)
nRBC: 1.2 % — ABNORMAL HIGH (ref 0.0–0.2)

## 2022-08-20 LAB — COMPREHENSIVE METABOLIC PANEL
ALT: 23 U/L (ref 0–44)
AST: 26 U/L (ref 15–41)
Albumin: 3.3 g/dL — ABNORMAL LOW (ref 3.5–5.0)
Alkaline Phosphatase: 585 U/L — ABNORMAL HIGH (ref 38–126)
Anion gap: 18 — ABNORMAL HIGH (ref 5–15)
BUN: 70 mg/dL — ABNORMAL HIGH (ref 6–20)
CO2: 20 mmol/L — ABNORMAL LOW (ref 22–32)
Calcium: 7.8 mg/dL — ABNORMAL LOW (ref 8.9–10.3)
Chloride: 90 mmol/L — ABNORMAL LOW (ref 98–111)
Creatinine, Ser: 7.35 mg/dL — ABNORMAL HIGH (ref 0.44–1.00)
GFR, Estimated: 6 mL/min — ABNORMAL LOW (ref >60)
Glucose, Bld: 94 mg/dL (ref 70–99)
Potassium: 5.3 mmol/L — ABNORMAL HIGH (ref 3.5–5.1)
Sodium: 128 mmol/L — ABNORMAL LOW (ref 135–145)
Total Bilirubin: 1.8 mg/dL — ABNORMAL HIGH (ref 0.3–1.2)
Total Protein: 7.7 g/dL (ref 6.5–8.1)

## 2022-08-20 LAB — CHLAMYDIA/NGC RT PCR (ARMC ONLY)
Chlamydia Tr: NOT DETECTED
N gonorrhoeae: NOT DETECTED

## 2022-08-20 LAB — URINE CULTURE: Culture: >=100000 — AB

## 2022-08-20 LAB — HEPATITIS B SURFACE ANTIBODY, QUANTITATIVE: Hep B S AB Quant (Post): 4.3 m[IU]/mL — ABNORMAL LOW (ref >9.9)

## 2022-08-20 LAB — GLUCOSE, CAPILLARY
Glucose-Capillary: 69 mg/dL — ABNORMAL LOW (ref 70–99)
Glucose-Capillary: 132 mg/dL — ABNORMAL HIGH (ref 70–99)
Glucose-Capillary: 77 mg/dL (ref 70–99)
Glucose-Capillary: 120 mg/dL — ABNORMAL HIGH (ref 70–99)
Glucose-Capillary: 106 mg/dL — ABNORMAL HIGH (ref 70–99)
Glucose-Capillary: 129 mg/dL — ABNORMAL HIGH (ref 70–99)
Glucose-Capillary: 82 mg/dL (ref 70–99)

## 2022-08-20 LAB — WET PREP, GENITAL
Trich, Wet Prep: NONE SEEN
Yeast Wet Prep HPF POC: NONE SEEN

## 2022-08-20 MED ORDER — SODIUM CHLORIDE 0.9 % IV SOLN
100.0000 mg | Freq: Two times a day (BID) | INTRAVENOUS | Status: DC
  Administered 2022-08-20 – 2022-08-23 (×5): 100 mg via INTRAVENOUS
  Filled 2022-08-20 (×7): qty 100

## 2022-08-20 MED ORDER — CEFAZOLIN SODIUM-DEXTROSE 1-4 GM/50ML-% IV SOLN
1.0000 g | Freq: Once | INTRAVENOUS | Status: AC
  Filled 2022-08-20: qty 50

## 2022-08-20 MED ORDER — CEFAZOLIN IN SODIUM CHLORIDE 3-0.9 GM/100ML-% IV SOLN
3.0000 g | Freq: Once | INTRAVENOUS | Status: DC
  Filled 2022-08-20: qty 100

## 2022-08-20 MED ORDER — CEFAZOLIN SODIUM-DEXTROSE 2-4 GM/100ML-% IV SOLN
2.0000 g | Freq: Once | INTRAVENOUS | Status: AC
  Administered 2022-08-21: 2 g via INTRAVENOUS
  Filled 2022-08-20: qty 100

## 2022-08-20 MED ORDER — INSULIN ASPART 100 UNIT/ML IJ SOLN
0.0000 [IU] | INTRAMUSCULAR | Status: DC
  Administered 2022-08-21 – 2022-08-22 (×4): 1 [IU] via SUBCUTANEOUS
  Administered 2022-08-26 (×3): 2 [IU] via SUBCUTANEOUS
  Administered 2022-08-26: 1 [IU] via SUBCUTANEOUS
  Administered 2022-08-26: 2 [IU] via SUBCUTANEOUS
  Administered 2022-08-27: 5 [IU] via SUBCUTANEOUS
  Administered 2022-08-27 (×2): 3 [IU] via SUBCUTANEOUS
  Administered 2022-08-27: 5 [IU] via SUBCUTANEOUS
  Filled 2022-08-20 (×11): qty 1

## 2022-08-20 MED ORDER — MIDODRINE HCL 5 MG PO TABS
ORAL_TABLET | ORAL | Status: AC
  Filled 2022-08-20: qty 4

## 2022-08-20 MED ORDER — METRONIDAZOLE 500 MG/100ML IV SOLN
500.0000 mg | Freq: Two times a day (BID) | INTRAVENOUS | Status: DC
  Administered 2022-08-20 – 2022-08-22 (×5): 500 mg via INTRAVENOUS
  Filled 2022-08-20 (×7): qty 100

## 2022-08-20 MED ORDER — MIDODRINE HCL 5 MG PO TABS
20.0000 mg | ORAL_TABLET | Freq: Two times a day (BID) | ORAL | Status: DC | PRN
  Administered 2022-08-20 – 2022-08-24 (×7): 20 mg via ORAL
  Filled 2022-08-20 (×6): qty 4

## 2022-08-20 MED ORDER — INSULIN GLARGINE-YFGN 100 UNIT/ML ~~LOC~~ SOLN
15.0000 [IU] | Freq: Every day | SUBCUTANEOUS | Status: DC
  Administered 2022-08-22: 15 [IU] via SUBCUTANEOUS
  Filled 2022-08-20 (×2): qty 0.15

## 2022-08-20 MED ORDER — INSULIN GLARGINE-YFGN 100 UNIT/ML ~~LOC~~ SOLN
15.0000 [IU] | Freq: Every day | SUBCUTANEOUS | Status: DC
  Filled 2022-08-20 (×2): qty 0.15

## 2022-08-20 NOTE — Progress Notes (Addendum)
[facility] PROGRESS NOTE   [name] ION:[id number] DOB: 15-Feb-1971 DOA: 08/18/2022  PCP: Orson Eva, NP  Brief History/Interval Summary:  52 y.o. female with medical history significant of  ESRD on HD T/thurs/Sat, with minimal urine out put, Asthma/COPD/OSA/OSH intolerant to bipap, Chronic respiratory failure on home O2 4L,  HTN, Hfpef, anxiety,  DMII, obesity, chronic wound right foot followed by wound care. Patient presented to ED with interim hx of dysuria as well as foul odor associated with her urine. Patient has been seen for this as outpatient and was placed on oral antibiotics. Patient had been on abx for close to 2 weeks and still has symptoms. Patient notes that over the last few days she has had increase lower abdominal pain /pressure with radiation towards her flank area.  UA was noted to be abnormal.  CT scan did not show any acute findings.  Patient was hospitalized as she failed outpatient treatment.    Consultants: Nephrology.  GYN will be consulted today.  Procedures: Hemodialysis    Subjective/Interval History: Patient continues to have discomfort in her abdomen.  She now mentions that her foul-smelling odor is coming from her vaginal discharge rather than urine.  No vaginal bleeding noted.  Continues to have some nausea but no vomiting.    Assessment/Plan:  Urinary tract infection. Patient with ESRD but does make small amount of urine at times.  But it is somewhat unusual for her.  When she noticed that the urine was quite foul-smelling and she was experiencing discomfort. Symptoms ongoing for about 2 weeks.  Failed outpatient treatment. Underwent in and out catheterization and urine was noted to be significantly abnormal.   CT scan reassuringly does not show any acute findings. Patient started on ceftriaxone.  Follow-up on urine cultures. Remains afebrile but has significant leukocytosis. Right upper quadrant ultrasound does not suggest  cholecystitis.  She does have mildly elevated bilirubin.  She does have elevated alkaline phosphatase which could be related to her renal disease.  Chronically elevated per previous levels. Today she mentions that she has been experiencing vaginal discharge and may have been confused about where the odor was coming from. See discussion below under "enlarged uterus."  Enlarged uterus with a degenerated fibroid CT scan suggested an enlarged uterus level secondary to fibroid.  Patient now mentions that she has been having vaginal discharge as well.  Pelvic ultrasound was done and was a limited exam but did show heterogeneous uterus consistent with multiple areas of calcification.   She is in a lot of discomfort.  Consulted Dr. Feliberto Gottron and CNM Margaretmary Eddy who will see the patient. Will add doxycycline and metronidazole to the ceftriaxone to cover for possible PID.  End-stage renal disease on hemodialysis She is dialyzed on a Tuesday Thursday Saturday schedule.  Last dialyzed on Saturday.  Nephrology is following.  Chronic wound right foot Seen by Wound care in the hospital.  This is followed by outpatient wound center as well. MRI from February revealed a large plantar lateral forefoot soft tissue ulceration with underlying subacute to chronic osteomyelitis of the first through fourth metatarsal head.  No abscesses noted at that time.  Patient denies any new symptoms in the right foot at this time.  Chronic respiratory failure with hypoxia This is in the setting of pulmonary hypertension and obesity hypoventilation syndrome. On oxygen at 4 L/min at home.  Continue.  History of asthma Stable.  Pulmonary hypertension Follows with pulmonology.  Chronic hypotension Noted to be on midodrine.  Low blood pressure noted but she is asymptomatic.  Chronic diastolic CHF Fluid management with hemodialysis.  History of anxiety disorder Continue home medications.  Diabetes mellitus type  2 Continue SSI.  Noted to be on glargine.  CBG noted to be low this morning.  Will decrease the dose of her glargine. HbA1c 7.8.  Obesity Estimated body mass index is 44.84 kg/m as calculated from the following:   Height as of 07/15/22: 5\' 4"  (1.626 m).   Weight as of this encounter: 118.5 kg.   DVT Prophylaxis: Heparin subcutaneous Code Status: Full code Family Communication: Discussed with patient Disposition Plan: Home when improved.  She tells me that due to her chronic wound in the foot she is currently using a wheelchair to get around.  Status is: Inpatient Remains inpatient appropriate because: Failure of outpatient treatment for UTI.  Requiring IV antibiotics      Medications: Scheduled:  arformoterol  15 mcg Nebulization Q12H   aspirin EC  81 mg Oral Daily   atorvastatin  40 mg Oral Daily   budesonide  0.5 mg Nebulization BID   Chlorhexidine Gluconate Cloth  6 each Topical Q0600   cholecalciferol  1,000 Units Oral Daily   escitalopram  10 mg Oral Daily   heparin  5,000 Units Subcutaneous Q8H   insulin aspart  0-9 Units Subcutaneous TID WC   insulin glargine-yfgn  15 Units Subcutaneous Daily   montelukast  10 mg Oral QHS   pantoprazole  40 mg Oral Daily   sevelamer carbonate  2.4 g Oral TID WC   Continuous:  anticoagulant sodium citrate     cefTRIAXone (ROCEPHIN)  IV Stopped (08/19/22 1711)   WUJ:WJXBJYNWGNFAO, albuterol, alteplase, anticoagulant sodium citrate, fentaNYL (SUBLIMAZE) injection, heparin, lidocaine (PF), lidocaine-prilocaine, midodrine, ondansetron **OR** ondansetron (ZOFRAN) IV, oxybutynin, oxyCODONE, pentafluoroprop-tetrafluoroeth, pregabalin  Antibiotics: Anti-infectives (From admission, onward)    Start     Dose/Rate Route Frequency Ordered Stop   08/19/22 1600  cefTRIAXone (ROCEPHIN) 1 g in sodium chloride 0.9 % 100 mL IVPB        1 g 200 mL/hr over 30 Minutes Intravenous Every 24 hours 08/19/22 0845     08/18/22 1600  cefTRIAXone  (ROCEPHIN) 1 g in sodium chloride 0.9 % 100 mL IVPB        1 g 200 mL/hr over 30 Minutes Intravenous  Once 08/18/22 1556 08/18/22 1657       Objective:  Vital Signs  Vitals:   08/20/22 0838 08/20/22 0851 08/20/22 0900 08/20/22 0930  BP: 125/74 113/72 94/66 99/73   Pulse: 84 81 87 84  Resp: 13 18 13 14   Temp: 97.7 F (36.5 C)     TempSrc: Oral     SpO2: 98% 94% 99% 96%  Weight: 118.5 kg       Intake/Output Summary (Last 24 hours) at 08/20/2022 0946 Last data filed at 08/19/2022 1918 Gross per 24 hour  Intake 581.16 ml  Output --  Net 581.16 ml   Filed Weights   08/20/22 0111 08/20/22 0838  Weight: 119.6 kg 118.5 kg    General appearance: Awake alert.  In no distress Resp: Clear to auscultation bilaterally.  Normal effort Cardio: S1-S2 is normal regular.  No S3-S4.  No rubs murmurs or bruit GI: Abdomen is obese.  Tender to palpate in the lower abdomen.  No rebound rigidity or guarding.  No masses organomegaly.  Bowel sounds present. Extremities: No edema.  Right foot covered in dressing. Neurologic: Alert and oriented x3.  No focal neurological  deficits.      Lab Results:  Data Reviewed: I have personally reviewed following labs and reports of the imaging studies  CBC: Recent Labs  Lab 08/18/22 1211 08/19/22 0326 08/20/22 0536  WBC 18.5* 17.3* 21.4*  NEUTROABS 15.7*  --   --   HGB 13.1 13.4 13.5  HCT 40.6 43.0 43.5  MCV 93.1 95.3 94.6  PLT 323 273 305     Basic Metabolic Panel: Recent Labs  Lab 08/18/22 1211 08/19/22 0326 08/20/22 0536  NA 133* 132* 128*  K 3.9 4.5 5.3*  CL 96* 94* 90*  CO2 22 20* 20*  GLUCOSE 208* 160* 94  BUN 45* 52* 70*  CREATININE 5.65* 6.25* 7.35*  CALCIUM 7.1* 7.3* 7.8*     GFR: Estimated Creatinine Clearance: 11.5 mL/min (A) (by C-G formula based on SCr of 7.35 mg/dL (H)).  Liver Function Tests: Recent Labs  Lab 08/18/22 1211 08/19/22 0326 08/20/22 0536  AST 33 36 26  ALT 26 26 23   ALKPHOS 592* 593* 585*   BILITOT 1.3* 1.4* 1.8*  PROT 7.4 7.8 7.7  ALBUMIN 3.2* 3.3* 3.3*     HbA1C: Recent Labs    08/18/22 1211  HGBA1C 7.8*     CBG: Recent Labs  Lab 08/19/22 1620 08/19/22 2104 08/20/22 0754 08/20/22 0850 08/20/22 0912  GLUCAP 205* 144* 69* 77 132*      Thyroid Function Tests: Recent Labs    08/18/22 1211  TSH 2.590      Radiology Studies: US PELVIS (TRANSABDOMINAL ONLY)  Result Date: 08/19/2022 CLINICAL DATA:  Fibroid uterus EXAM: TRANSABDOMINAL ULTRASOUND OF PELVIS TECHNIQUE: Transabdominal ultrasound examination of the pelvis was performed including evaluation of the uterus, ovaries, adnexal regions, and pelvic cul-de-sac. COMPARISON:  CT scan 08/18/2018 FINDINGS: Uterus Measurements: 9.1 x 6.6 x 9.5 cm = volume: 294 mL. The uterus is diffusely heterogeneous. Of note the images are limited due to contracted urinary bladder overlapping bowel gas and soft tissue. Endometrium Thickness: Not seen.  Obscured Right ovary Measurements: Not Seen. Left ovary Measurements: Not seen. Other findings:  No free fluid. IMPRESSION: Extremely limited examination. Heterogeneous uterus consistent with multiple areas of calcification. These could be vascular. Underlying mass lesion or fibroid is not excluded. The ovaries and endometrium are also obscured. Overall additional cross-sectional study as clinically directed for further delineation Electronically Signed   By: Karen Kays M.D.   On: 08/19/2022 10:59   US Abdomen Limited RUQ (LIVER/GB)  Result Date: 08/18/2022 CLINICAL DATA:  Abnormal liver function tests EXAM: ULTRASOUND ABDOMEN LIMITED RIGHT UPPER QUADRANT COMPARISON:  CT abdomen and pelvis dated 08/18/2022 FINDINGS: Gallbladder: No gallstones or wall thickening visualized. No sonographic Murphy sign noted by sonographer. Trace pericholecystic free fluid. Common bile duct: Diameter: 3 mm Liver: No focal lesion identified. Subjective diffuse periportal echogenicity. Portal vein is  patent on color Doppler imaging with normal direction of blood flow towards the liver. Other: None. IMPRESSION: 1. Subjective diffuse periportal echogenicity, which is nonspecific but can be seen in the setting of hepatitis. 2. Trace pericholecystic free fluid, nonspecific. No sonographic evidence of acute cholecystitis. Electronically Signed   By: Agustin Cree M.D.   On: 08/18/2022 21:04   X-ray chest PA and lateral  Result Date: 08/18/2022 CLINICAL DATA:  Leukocytosis and shortness of breath. EXAM: CHEST - 2 VIEW COMPARISON:  Radiographs 04/24/2022 FINDINGS: Stable cardiomediastinal silhouette. Low lung volumes. Bibasilar atelectasis or infiltrates. No pleural effusion or pneumothorax. IMPRESSION: Bibasilar atelectasis or infiltrates. Electronically Signed   By: Joselyn Glassman  Stutzman M.D.   On: 08/18/2022 19:02   CT ABDOMEN PELVIS WO CONTRAST  Result Date: 08/18/2022 CLINICAL DATA:  Right lower quadrant abdominal pain EXAM: CT ABDOMEN AND PELVIS WITHOUT CONTRAST TECHNIQUE: Multidetector CT imaging of the abdomen and pelvis was performed following the standard protocol without IV contrast. RADIATION DOSE REDUCTION: This exam was performed according to the departmental dose-optimization program which includes automated exposure control, adjustment of the mA and/or kV according to patient size and/or use of iterative reconstruction technique. COMPARISON:  CT examination dated April 24, 2021 FINDINGS: Lower chest: Subsegmental atelectasis in the right lower lobe. No acute abnormality Hepatobiliary: No focal liver abnormality is seen. No gallstones, gallbladder wall thickening, or biliary dilatation. Pancreas: Unremarkable. No pancreatic ductal dilatation or surrounding inflammatory changes. Spleen: Normal in size without focal abnormality. Adrenals/Urinary Tract: Adrenal glands are unremarkable. Kidneys are normal, without renal calculi, focal lesion, or hydronephrosis. Bladder is unremarkable. Stomach/Bowel:  Stomach is within normal limits. Appendix appears normal. No evidence of bowel wall thickening, distention, or inflammatory changes. Vascular/Lymphatic: Moderate aortic atherosclerosis. No enlarged abdominal or pelvic lymph nodes. Reproductive: Enlarged uterus with degenerated fibroid with calcification. Bilateral adnexa are unremarkable. Other: No abdominal wall hernia or abnormality. No abdominopelvic ascites. Musculoskeletal: No acute or significant osseous findings. IMPRESSION: 1. No CT evidence of acute abdominal/pelvic process. Normal appendix. 2. Enlarged uterus with degenerated fibroid with calcification. 3. Moderate aortic atherosclerosis. Electronically Signed   By: Larose Hires D.O.   On: 08/18/2022 14:38       LOS: 2 days   Nehemias Sauceda Rito Ehrlich  [facility] Pager on [email or web address]  08/20/2022, 9:46 AM

## 2022-08-20 NOTE — Consult Note (Signed)
Consult History and Physical   SERVICE: Gynecology   Patient Name: [name] Patient MRN:   [id number]  CC: foul odor associated with urine  HPI: [name] is a 52 y.o. G6P4 who presented to the ED with complaints of foul smelling urine.  Her history is significant for ESRD on dialysis, chronic respiratory failure, HTN, DMT2, obesity, and anxiety.  She was noted to have foul smelling vaginal discharge during her hospital admission. [name] is unsure when the discharge started but has noticed an change in odor that has gradually gotten worse.  She reports that she is occasionally sexually active with her husband and has intermittent vaginal bleeding/spotting following intercourse.  Her last regular menstrual cycle was 18 months ago but she has continued to have intermittent spotting since. Her last pap smear was in 2009 and was normal, denies any history of abnormal pap smears.     Review of Systems: positives in bold Review of Systems  Constitutional:  Positive for malaise/fatigue. Negative for chills and fever.  Respiratory:  Negative for cough.   Cardiovascular:  Negative for chest pain and palpitations.  Gastrointestinal:  Positive for abdominal pain. Negative for heartburn, nausea and vomiting.  Genitourinary:  Positive for dysuria.       Denies regular vaginal bleeding Reports intermittent post-coital bleeding   Musculoskeletal:  Positive for myalgias.  Neurological:  Negative for dizziness and headaches.  Psychiatric/Behavioral:  Negative for depression. The patient is nervous/anxious.      Past Obstetrical History: OB History     Gravida  6   Para  4   Term      Preterm      AB      Living         SAB      IAB      Ectopic      Multiple      Live Births              Past Gynecologic History: [name] last menstrual period was 10/14/2012 (approximate). Menstrual cycles are absent. She thinks she had menstrual cycles up to 18 months ago and  had several episodes of intermittent spotting and bleeding over the past year.   Past Medical History: Past Medical History:  Diagnosis Date   (HFpEF) heart failure with preserved ejection fraction (HCC)    a. 11/2014 Echo: EF 50-55%, Gr1 DD; b. 06/2016 Echo: EF 60-65%, no rwma, mod dil LA, nl RV, triv eff.   Anxiety    Asthma    Bronchitis    CHF (congestive heart failure) (HCC)    Chronic cough    Chronic respiratory failure with hypoxia and hypercapnia (HCC)    a. Home O2.  (2-4L)   CKD (chronic kidney disease), stage III (HCC)    Depression    Diabetes mellitus without complication (HCC)    Diverticulosis    Dyspnea    Dysrhythmia    per pt, no arrythmias   Environmental and seasonal allergies    Extreme obesity with alveolar hypoventilation (HCC)    Hypertension    Iron deficiency anemia    a. chronic blood loss->heavy menses.   Kidney failure    dialysis tues/thur/sat   ... left arm shunt   Orthopnea    Oxygen dependent    3l/min   Pericardial effusion    a. 11/2014 CT Chest: Mod effusion; b. 11/2014 Echo: small to mod circumferential effusion. No hemodynamic compromise.   Pneumonia    history  Poorly controlled diabetes mellitus (HCC)    a. 11/2014 A1c 13.2.   Swelling of both lower extremities     Past Surgical History:   Past Surgical History:  Procedure Laterality Date   A/V FISTULAGRAM Left 06/09/2017   Procedure: A/V FISTULAGRAM;  Surgeon: Annice Needy, MD;  Location: [facility];  Service: Cardiovascular;  Laterality: Left;   A/V FISTULAGRAM Left 06/16/2017   Procedure: A/V FISTULAGRAM;  Surgeon: Annice Needy, MD;  Location: [facility];  Service: Cardiovascular;  Laterality: Left;   A/V FISTULAGRAM Left 01/28/2018   Procedure: A/V FISTULAGRAM;  Surgeon: Annice Needy, MD;  Location: [facility];  Service: Cardiovascular;  Laterality: Left;   AMPUTATION TOE Right 10/21/2020   Procedure: AMPUTATION TOE-5th Digit Amputation Right  Foot;  Surgeon: Felecia Shelling, DPM;  Location: [facility];  Service: Podiatry;  Laterality: Right;   AV FISTULA PLACEMENT Left 04/25/2017   Procedure: ARTERIOVENOUS (AV) FISTULA CREATION;  Surgeon: Annice Needy, MD;  Location: [facility];  Service: Vascular;  Laterality: Left;   BONE BIOPSY Right 04/27/2021   Procedure: Right 4th metatarsal BONE BIOPSY;  Surgeon: Candelaria Stagers, DPM;  Location: [facility];  Service: Podiatry;  Laterality: Right;   CATARACT EXTRACTION W/PHACO Left 01/21/2018   Procedure: CATARACT EXTRACTION PHACO AND INTRAOCULAR LENS PLACEMENT (IOC);  Surgeon: Lockie Mola, MD;  Location: [facility];  Service: Ophthalmology;  Laterality: Left;  [place] 01:55.5 CDE 9.05 EAUP 13.9 Fluid Pack Lot # [id number]   CESAREAN SECTION     times 3   DIALYSIS/PERMA CATHETER INSERTION Right 04/04/2017   Procedure: DIALYSIS/PERMA CATHETER INSERTION;  Surgeon: Renford Dills, MD;  Location: [facility];  Service: Cardiovascular;  Laterality: Right;   DIALYSIS/PERMA CATHETER REMOVAL N/A 09/17/2017   Procedure: DIALYSIS/PERMA CATHETER REMOVAL;  Surgeon: Annice Needy, MD;  Location: [facility];  Service: Cardiovascular;  Laterality: N/A;   INCISION AND DRAINAGE Right 10/26/2020   Procedure: INCISION AND DRAINAGE;  Surgeon: Candelaria Stagers, DPM;  Location: [facility];  Service: Podiatry;  Laterality: Right;   INSERTION OF DIALYSIS CATHETER N/A 04/16/2017   Procedure: INSERTION OF DIALYSIS PERM CATHETER;  Surgeon: Annice Needy, MD;  Location: [facility];  Service: Vascular;  Laterality: N/A;   LOWER EXTREMITY ANGIOGRAPHY Right 10/18/2020   Procedure: Lower Extremity Angiography;  Surgeon: Annice Needy, MD;  Location: [facility];  Service: Cardiovascular;  Laterality: Right;   LOWER EXTREMITY ANGIOGRAPHY Right 04/17/2022   Procedure: Lower Extremity Angiography;  Surgeon: Annice Needy, MD;  Location: [facility];  Service: Cardiovascular;  Laterality: Right;    Family History:   family history includes Breast cancer (age of onset: 28) in her maternal aunt and maternal aunt; Diabetes in her father, mother, and another family member; Hypertension in her father, mother, and another family member.  Social History:  Social History   Socioeconomic History   Marital status: Married    Spouse name: abjul   Number of children: Not on file   Years of education: 12   Highest education level: High school graduate  Occupational History   Occupation: Disabled  Tobacco Use   Smoking status: Never    Passive exposure: Past   Smokeless tobacco: Never  Vaping Use   Vaping Use: Never used  Substance and Sexual Activity   Alcohol use: Not Currently    Comment: occasional   Drug use: Yes    Types: Marijuana  Sexual activity: Not Currently    Birth control/protection: None  Other Topics Concern   Not on file  Social History Narrative   Lives in [place] with her husband and 47 y/o child.  Three other children are grown and out of the house.  She is sedentary and does not routinely exercise.  On home O2.   Social Determinants of Health   Financial Resource Strain: High Risk (03/24/2017)   Overall Financial Resource Strain (CARDIA)    Difficulty of Paying Living Expenses: Very hard  Food Insecurity: No Food Insecurity (08/18/2022)   Hunger Vital Sign    Worried About Running Out of Food in the Last Year: Never true    Ran Out of Food in the Last Year: Never true  Transportation Needs: No Transportation Needs (08/18/2022)   PRAPARE - Administrator, Civil Service (Medical): No    Lack of Transportation (Non-Medical): No  Physical Activity: Inactive (03/24/2017)   Exercise Vital Sign    Days of Exercise per Week: 0 days    Minutes of Exercise per Session: 0 min  Stress: No Stress Concern Present (03/24/2017)   [employer] of Occupational Health - Occupational Stress Questionnaire    Feeling of Stress : Not at all  Social Connections: Somewhat Isolated  (03/24/2017)   Social Connection and Isolation Panel [NHANES]    Frequency of Communication with Friends and Family: More than three times a week    Frequency of Social Gatherings with Friends and Family: Once a week    Attends Religious Services: Never    Database administrator or Organizations: No    Attends Banker Meetings: Never    Marital Status: Married  Catering manager Violence: Not At Risk (08/18/2022)   Humiliation, Afraid, Rape, and Kick questionnaire    Fear of Current or Ex-Partner: No    Emotionally Abused: No    Physically Abused: No    Sexually Abused: No    Home Medications:  Medications reconciled in EPIC  No current facility-administered medications on file prior to encounter.   Current Outpatient Medications on File Prior to Encounter  Medication Sig Dispense Refill   acetaminophen (TYLENOL) 500 MG tablet Take 1,000 mg by mouth every 6 (six) hours as needed for mild pain, fever or headache.     albuterol (PROVENTIL) (2.5 MG/3ML) 0.083% nebulizer solution Take 3 mLs (2.5 mg total) by nebulization every 6 (six) hours as needed for wheezing or shortness of breath. 75 mL 11   albuterol (VENTOLIN HFA) 108 (90 Base) MCG/ACT inhaler Inhale 2 puffs into the lungs every 6 (six) hours as needed for wheezing or shortness of breath. 18 g 2   atorvastatin (LIPITOR) 40 MG tablet Take 1 tablet (40 mg total) by mouth daily. 90 tablet 3   benzonatate (TESSALON PERLES) 100 MG capsule Take 1 capsule (100 mg total) by mouth 3 (three) times daily as needed for cough. 30 capsule 1   budesonide (PULMICORT) 0.5 MG/2ML nebulizer solution USE 1 VIAL  IN  NEBULIZER TWICE  DAILY - (Rinse Mouth After Each Treatment) 2 mL 11   ciprofloxacin (CIPRO) 500 MG tablet Take 500 mg by mouth daily.     clindamycin (CLEOCIN T) 1 % lotion APPLY TO FACE, ARMS AND LEGS EVENLY NIGHTLY AT BEDTIME 180 mL 0   escitalopram (LEXAPRO) 10 MG tablet Take 1 tablet (10 mg total) by mouth daily. 30 tablet 2    formoterol (PERFOROMIST) 20 MCG/2ML nebulizer solution USE 1 VIAL  IN  NEBULIZER TWICE  DAILY - (Morning And Evening) 2 mL 11   gentamicin cream (GARAMYCIN) 0.1 % Apply 1 Application topically at bedtime.     insulin degludec (TRESIBA) 100 UNIT/ML FlexTouch Pen Inject 20 Units into the skin daily.     lidocaine-prilocaine (EMLA) cream Apply topically as needed (For access for dialysis).     LINZESS 290 MCG CAPS capsule TAKE 1 CAPSULE BY MOUTH IN THE MORNING WITH A GLASS OF WATER 30 MINUTES BEFORE BREAKFAST 30 capsule 0   methocarbamol (ROBAXIN) 750 MG tablet Take 750 mg by mouth every 8 (eight) hours as needed for muscle spasms.     midodrine (PROAMATINE) 10 MG tablet Take 20 mg by mouth daily as needed (for low blood pressure on dialysis days).     ondansetron (ZOFRAN) 4 MG tablet Take 4 mg by mouth 2 (two) times daily as needed for nausea.     pregabalin (LYRICA) 50 MG capsule TAKE 1 CAPSULE BY MOUTH EVERY 8 HOURS AS NEEDED FOR  NEUROPATHY 90 capsule 0   revefenacin (YUPELRI) 175 MCG/3ML nebulizer solution      sevelamer (RENAGEL) 800 MG tablet Take 800-2,400 mg by mouth 4 (four) times daily. Three tablets three times daily with meals and 1 tablet with snacks once daily     torsemide (DEMADEX) 100 MG tablet Take 100 mg by mouth daily.     aspirin EC 81 MG tablet Take 81 mg by mouth daily.  (Patient not taking: Reported on 08/19/2022)     cloNIDine (CATAPRES) 0.1 MG tablet Take 0.1 mg by mouth 2 (two) times daily. (Patient not taking: Reported on 08/19/2022)     doxepin (SINEQUAN) 10 MG capsule Take 1 capsule (10 mg total) by mouth at bedtime. (Patient not taking: Reported on 08/19/2022) 30 capsule 0   ferric citrate (AURYXIA) 1 GM 210 MG(Fe) tablet Take 210 mg by mouth 3 (three) times daily with meals. Only taking once a day most days (Patient not taking: Reported on 08/19/2022)     insulin lispro (HUMALOG) 100 UNIT/ML KwikPen Inject into the skin. (Patient not taking: Reported on 08/19/2022)      lactulose (CHRONULAC) 10 GM/15ML solution Take 30 mLs (20 g total) by mouth daily as needed for severe constipation. (Patient not taking: Reported on 08/19/2022) 240 mL 0   montelukast (SINGULAIR) 10 MG tablet Take 1 tablet (10 mg total) by mouth at bedtime. (Patient not taking: Reported on 08/19/2022) 30 tablet 3   Omeprazole Magnesium (PRILOSEC PO)  (Patient not taking: Reported on 08/19/2022)     OXYGEN Inhale 2-4 L into the lungs.     predniSONE (DELTASONE) 50 MG tablet Take 1 tablet by mouth daily in AM with food x 5 days (Patient not taking: Reported on 08/19/2022) 5 tablet 0   RENVELA 2.4 g PACK Take 2.4 g by mouth 3 (three) times daily. (Patient not taking: Reported on 08/19/2022)     sucroferric oxyhydroxide (VELPHORO) 500 MG chewable tablet Chew 500 mg by mouth 3 (three) times daily with meals.     Vitamin D, Cholecalciferol, 25 MCG (1000 UT) TABS Take 1,000 Units by mouth daily. (Patient not taking: Reported on 08/19/2022)      Allergies:  Allergies  Allergen Reactions   Dust Mite Extract Shortness Of Breath and Itching   Mold Extract [Trichophyton] Shortness Of Breath and Itching   Pollen Extract Shortness Of Breath    Itching, shortness of breath, asthma like symptoms   Ozempic (0.25 Or 0.5 Mg-Dose) [  Semaglutide(0.25 Or 0.5mg -Dos)] Other (See Comments)    pancreatitis   Sulfa Antibiotics Itching   Feraheme [Ferumoxytol] Itching    Uses benadryl so she can get the medicine    Physical Exam:  Temp:  [97.4 F (36.3 C)-98.8 F (37.1 C)] 97.9 F (36.6 C) (06/25 1636) Pulse Rate:  [73-88] 85 (06/25 1636) Resp:  [13-20] 17 (06/25 1636) BP: (91-177)/(39-161) 115/81 (06/25 1636) SpO2:  [90 %-100 %] 93 % (06/25 1636) FiO2 (%):  [36 %] 36 % (06/24 2044) Weight:  [117.9 kg-119.6 kg] 117.9 kg (06/25 1234)  Physical Exam Exam conducted with a chaperone present.  Constitutional:      Appearance: She is well-developed. She is obese.  Pulmonary:     Effort: Pulmonary effort is normal.   Abdominal:     Tenderness: There is abdominal tenderness in the suprapubic area.  Genitourinary:    Vagina: Vaginal discharge (large amount of gray to brown watery discharge) present.     Uterus: Tender (bimanual limted by body habitus, mild tenderness to palpation, unable to palpate for size and shape).      Comments: Exam limited by body habitus and patient's tolerance of exam  Cervix palpated firm and irregular Skin:    General: Skin is warm and dry.     Capillary Refill: Capillary refill takes less than 2 seconds.  Neurological:     Mental Status: She is alert and oriented to person, place, and time.  Psychiatric:        Mood and Affect: Mood is anxious.    Labs/Studies:   CBC and Coags:  Lab Results  Component Value Date   WBC 21.4 (H) 08/20/2022   NEUTOPHILPCT 84 08/18/2022   EOSPCT 0 08/18/2022   BASOPCT 0 08/18/2022   LYMPHOPCT 8 08/18/2022   HGB 13.5 08/20/2022   HCT 43.5 08/20/2022   MCV 94.6 08/20/2022   PLT 305 08/20/2022   INR 1.2 04/15/2022   CMP:  Lab Results  Component Value Date   NA 128 (L) 08/20/2022   K 5.3 (H) 08/20/2022   CL 90 (L) 08/20/2022   CO2 20 (L) 08/20/2022   BUN 70 (H) 08/20/2022   CREATININE 7.35 (H) 08/20/2022   CREATININE 6.25 (H) 08/19/2022   CREATININE 5.65 (H) 08/18/2022   PROT 7.7 08/20/2022   BILITOT 1.8 (H) 08/20/2022   BILIDIR 0.1 05/01/2021   ALT 23 08/20/2022   AST 26 08/20/2022   ALKPHOS 585 (H) 08/20/2022    TVUS:   Other Imaging: US PELVIS (TRANSABDOMINAL ONLY)  Result Date: 08/19/2022 CLINICAL DATA:  Fibroid uterus EXAM: TRANSABDOMINAL ULTRASOUND OF PELVIS TECHNIQUE: Transabdominal ultrasound examination of the pelvis was performed including evaluation of the uterus, ovaries, adnexal regions, and pelvic cul-de-sac. COMPARISON:  CT scan 08/18/2018 FINDINGS: Uterus Measurements: 9.1 x 6.6 x 9.5 cm = volume: 294 mL. The uterus is diffusely heterogeneous. Of note the images are limited due to contracted urinary  bladder overlapping bowel gas and soft tissue. Endometrium Thickness: Not seen.  Obscured Right ovary Measurements: Not Seen. Left ovary Measurements: Not seen. Other findings:  No free fluid. IMPRESSION: Extremely limited examination. Heterogeneous uterus consistent with multiple areas of calcification. These could be vascular. Underlying mass lesion or fibroid is not excluded. The ovaries and endometrium are also obscured. Overall additional cross-sectional study as clinically directed for further delineation Electronically Signed   By: Karen Kays M.D.   On: 08/19/2022 10:59   US Abdomen Limited RUQ (LIVER/GB)  Result Date: 08/18/2022 CLINICAL DATA:  Abnormal  liver function tests EXAM: ULTRASOUND ABDOMEN LIMITED RIGHT UPPER QUADRANT COMPARISON:  CT abdomen and pelvis dated 08/18/2022 FINDINGS: Gallbladder: No gallstones or wall thickening visualized. No sonographic Murphy sign noted by sonographer. Trace pericholecystic free fluid. Common bile duct: Diameter: 3 mm Liver: No focal lesion identified. Subjective diffuse periportal echogenicity. Portal vein is patent on color Doppler imaging with normal direction of blood flow towards the liver. Other: None. IMPRESSION: 1. Subjective diffuse periportal echogenicity, which is nonspecific but can be seen in the setting of hepatitis. 2. Trace pericholecystic free fluid, nonspecific. No sonographic evidence of acute cholecystitis. Electronically Signed   By: Agustin Cree M.D.   On: 08/18/2022 21:04   X-ray chest PA and lateral  Result Date: 08/18/2022 CLINICAL DATA:  Leukocytosis and shortness of breath. EXAM: CHEST - 2 VIEW COMPARISON:  Radiographs 04/24/2022 FINDINGS: Stable cardiomediastinal silhouette. Low lung volumes. Bibasilar atelectasis or infiltrates. No pleural effusion or pneumothorax. IMPRESSION: Bibasilar atelectasis or infiltrates. Electronically Signed   By: Minerva Fester M.D.   On: 08/18/2022 19:02   CT ABDOMEN PELVIS WO CONTRAST  Result  Date: 08/18/2022 CLINICAL DATA:  Right lower quadrant abdominal pain EXAM: CT ABDOMEN AND PELVIS WITHOUT CONTRAST TECHNIQUE: Multidetector CT imaging of the abdomen and pelvis was performed following the standard protocol without IV contrast. RADIATION DOSE REDUCTION: This exam was performed according to the departmental dose-optimization program which includes automated exposure control, adjustment of the mA and/or kV according to patient size and/or use of iterative reconstruction technique. COMPARISON:  CT examination dated April 24, 2021 FINDINGS: Lower chest: Subsegmental atelectasis in the right lower lobe. No acute abnormality Hepatobiliary: No focal liver abnormality is seen. No gallstones, gallbladder wall thickening, or biliary dilatation. Pancreas: Unremarkable. No pancreatic ductal dilatation or surrounding inflammatory changes. Spleen: Normal in size without focal abnormality. Adrenals/Urinary Tract: Adrenal glands are unremarkable. Kidneys are normal, without renal calculi, focal lesion, or hydronephrosis. Bladder is unremarkable. Stomach/Bowel: Stomach is within normal limits. Appendix appears normal. No evidence of bowel wall thickening, distention, or inflammatory changes. Vascular/Lymphatic: Moderate aortic atherosclerosis. No enlarged abdominal or pelvic lymph nodes. Reproductive: Enlarged uterus with degenerated fibroid with calcification. Bilateral adnexa are unremarkable. Other: No abdominal wall hernia or abnormality. No abdominopelvic ascites. Musculoskeletal: No acute or significant osseous findings. IMPRESSION: 1. No CT evidence of acute abdominal/pelvic process. Normal appendix. 2. Enlarged uterus with degenerated fibroid with calcification. 3. Moderate aortic atherosclerosis. Electronically Signed   By: Larose Hires D.O.   On: 08/18/2022 14:38     Assessment / Plan:   [name] is a 52 y.o. G6P4 who presents with abnormal vaginal discharge   1. Plan of care and assessment  reviewed with Dr. Feliberto Gottron  -Discussed that pelvic exam was concerning for abnormal and foul smelling vaginal discharge.  Unable to fully visualize cervix d/t body habitus and patient's tolerance of pelvic exam.  Bimanual exam was also limited d/t pain and body habitus.   -She reports her last menstrual cycle was > 18 months ago but she's continued to have intermittent vaginal spotting, particularly after intercourse.  -Wet prep and GC/CT swabs were collected.   -Recommend further evaluation for cervical cancer with pap smear and potential biopsy/endometrial sampling based on exam and findings during the assessment.   -Dr. Feliberto Gottron will discuss with hospital team about timing of possible D&C, pap smear, and biopsies for further evaluation.    Thank you for the opportunity to be involved with this patient's care.  ----- Margaretmary Eddy, CNM Midwife [facility],  Department of OB/GYN [facility]

## 2022-08-20 NOTE — Progress Notes (Signed)
[facility]  ROUNDING NOTE   Subjective:   [name] is a 52 year old African-American female with a past medical history of end-stage renal disease, patient is on Tuesday Thursday Saturday schedule, diabetes mellitus, history of pancreatitis who came to the ER with chief complaint of abdominal pain with foul smelling urine for 2 weeks.  Patient is known to our clinic and receives outpatient dialysis treatments at [facility] on a TTS schedule, supervised by Dr Thedore Mins.   Patient seen and evaluated during dialysis   HEMODIALYSIS FLOWSHEET:  Blood Flow Rate (mL/min): 350 mL/min Arterial Pressure (mmHg): -170 mmHg Venous Pressure (mmHg): 120 mmHg TMP (mmHg): 10 mmHg Ultrafiltration Rate (mL/min): 800 mL/min Dialysate Flow Rate (mL/min): 300 ml/min Dialysis Fluid Bolus: Normal Saline  Ill-appearing Blood sugar 77 on arrival, 69 prior to leaving unit.  Patient giving snacks prior to arriving to dialysis Breakfast tray ordered and awaiting arrival in dialysis  Objective:  Vital signs in last 24 hours:  Temp:  [97.4 F (36.3 C)-98.8 F (37.1 C)] 97.7 F (36.5 C) (06/25 0838) Pulse Rate:  [73-88] 86 (06/25 1130) Resp:  [13-20] 16 (06/25 1130) BP: (91-142)/(39-125) 96/84 (06/25 1130) SpO2:  [90 %-99 %] 96 % (06/25 1130) FiO2 (%):  [36 %] 36 % (06/24 2044) Weight:  [118.5 kg-119.6 kg] 118.5 kg (06/25 0838)  Weight change:  Filed Weights   08/20/22 0111 08/20/22 0838  Weight: 119.6 kg 118.5 kg    Intake/Output: I/O last 3 completed shifts: In: 581.2 [P.O.:480; IV Piggyback:101.2] Out: -    Intake/Output this shift:  No intake/output data recorded.  Physical Exam: General: NAD  Head: Normocephalic, atraumatic. Moist oral mucosal membranes  Eyes: Anicteric  Lungs:  Clear to auscultation, NCO2  Heart: Regular rate and rhythm  Abdomen:  Soft, nontender  Extremities:  No peripheral edema.  Neurologic: Alert and oriented, moving all four extremities   Skin: Chronic right foot wound  Access: Lt AVF     Basic Metabolic Panel: Recent Labs  Lab 08/18/22 1211 08/19/22 0326 08/20/22 0536  NA 133* 132* 128*  K 3.9 4.5 5.3*  CL 96* 94* 90*  CO2 22 20* 20*  GLUCOSE 208* 160* 94  BUN 45* 52* 70*  CREATININE 5.65* 6.25* 7.35*  CALCIUM 7.1* 7.3* 7.8*     Liver Function Tests: Recent Labs  Lab 08/18/22 1211 08/19/22 0326 08/20/22 0536  AST 33 36 26  ALT 26 26 23   ALKPHOS 592* 593* 585*  BILITOT 1.3* 1.4* 1.8*  PROT 7.4 7.8 7.7  ALBUMIN 3.2* 3.3* 3.3*    No results for input(s): "LIPASE", "AMYLASE" in the last 168 hours. No results for input(s): "AMMONIA" in the last 168 hours.  CBC: Recent Labs  Lab 08/18/22 1211 08/19/22 0326 08/20/22 0536  WBC 18.5* 17.3* 21.4*  NEUTROABS 15.7*  --   --   HGB 13.1 13.4 13.5  HCT 40.6 43.0 43.5  MCV 93.1 95.3 94.6  PLT 323 273 305     Cardiac Enzymes: No results for input(s): "CKTOTAL", "CKMB", "CKMBINDEX", "TROPONINI" in the last 168 hours.  BNP: Invalid input(s): "POCBNP"  CBG: Recent Labs  Lab 08/19/22 1620 08/19/22 2104 08/20/22 0754 08/20/22 0850 08/20/22 0912  GLUCAP 205* 144* 69* 77 132*     Microbiology: Results for orders placed or performed during the hospital encounter of 08/18/22  Urine Culture     Status: Abnormal   Collection Time: 08/18/22  3:07 PM   Specimen: Urine, Clean Catch  Result Value Ref Range  Status   Specimen Description   Final    URINE, CLEAN CATCH Performed at [facility], [address] [place], [place] [address]    Special Requests   Final    NONE Performed at [facility], [address] [place], [place] [address]    Culture >=100,000 COLONIES/mL YEAST (A)  Final   Report Status 08/20/2022 FINAL  Final    Coagulation Studies: No results for input(s): "LABPROT", "INR" in the last 72 hours.  Urinalysis: Recent Labs    08/18/22 1507  COLORURINE YELLOW*  LABSPEC 1.021  PHURINE 6.0  GLUCOSEU  NEGATIVE  HGBUR MODERATE*  BILIRUBINUR NEGATIVE  KETONESUR NEGATIVE  PROTEINUR 100*  NITRITE NEGATIVE  LEUKOCYTESUR MODERATE*       Imaging: US PELVIS (TRANSABDOMINAL ONLY)  Result Date: 08/19/2022 CLINICAL DATA:  Fibroid uterus EXAM: TRANSABDOMINAL ULTRASOUND OF PELVIS TECHNIQUE: Transabdominal ultrasound examination of the pelvis was performed including evaluation of the uterus, ovaries, adnexal regions, and pelvic cul-de-sac. COMPARISON:  CT scan 08/18/2018 FINDINGS: Uterus Measurements: 9.1 x 6.6 x 9.5 cm = volume: 294 mL. The uterus is diffusely heterogeneous. Of note the images are limited due to contracted urinary bladder overlapping bowel gas and soft tissue. Endometrium Thickness: Not seen.  Obscured Right ovary Measurements: Not Seen. Left ovary Measurements: Not seen. Other findings:  No free fluid. IMPRESSION: Extremely limited examination. Heterogeneous uterus consistent with multiple areas of calcification. These could be vascular. Underlying mass lesion or fibroid is not excluded. The ovaries and endometrium are also obscured. Overall additional cross-sectional study as clinically directed for further delineation Electronically Signed   By: Karen Kays M.D.   On: 08/19/2022 10:59   US Abdomen Limited RUQ (LIVER/GB)  Result Date: 08/18/2022 CLINICAL DATA:  Abnormal liver function tests EXAM: ULTRASOUND ABDOMEN LIMITED RIGHT UPPER QUADRANT COMPARISON:  CT abdomen and pelvis dated 08/18/2022 FINDINGS: Gallbladder: No gallstones or wall thickening visualized. No sonographic Murphy sign noted by sonographer. Trace pericholecystic free fluid. Common bile duct: Diameter: 3 mm Liver: No focal lesion identified. Subjective diffuse periportal echogenicity. Portal vein is patent on color Doppler imaging with normal direction of blood flow towards the liver. Other: None. IMPRESSION: 1. Subjective diffuse periportal echogenicity, which is nonspecific but can be seen in the setting of hepatitis.  2. Trace pericholecystic free fluid, nonspecific. No sonographic evidence of acute cholecystitis. Electronically Signed   By: Agustin Cree M.D.   On: 08/18/2022 21:04   X-ray chest PA and lateral  Result Date: 08/18/2022 CLINICAL DATA:  Leukocytosis and shortness of breath. EXAM: CHEST - 2 VIEW COMPARISON:  Radiographs 04/24/2022 FINDINGS: Stable cardiomediastinal silhouette. Low lung volumes. Bibasilar atelectasis or infiltrates. No pleural effusion or pneumothorax. IMPRESSION: Bibasilar atelectasis or infiltrates. Electronically Signed   By: Minerva Fester M.D.   On: 08/18/2022 19:02   CT ABDOMEN PELVIS WO CONTRAST  Result Date: 08/18/2022 CLINICAL DATA:  Right lower quadrant abdominal pain EXAM: CT ABDOMEN AND PELVIS WITHOUT CONTRAST TECHNIQUE: Multidetector CT imaging of the abdomen and pelvis was performed following the standard protocol without IV contrast. RADIATION DOSE REDUCTION: This exam was performed according to the departmental dose-optimization program which includes automated exposure control, adjustment of the mA and/or kV according to patient size and/or use of iterative reconstruction technique. COMPARISON:  CT examination dated April 24, 2021 FINDINGS: Lower chest: Subsegmental atelectasis in the right lower lobe. No acute abnormality Hepatobiliary: No focal liver abnormality is seen. No gallstones, gallbladder wall thickening, or biliary dilatation. Pancreas: Unremarkable. No pancreatic ductal dilatation  or surrounding inflammatory changes. Spleen: Normal in size without focal abnormality. Adrenals/Urinary Tract: Adrenal glands are unremarkable. Kidneys are normal, without renal calculi, focal lesion, or hydronephrosis. Bladder is unremarkable. Stomach/Bowel: Stomach is within normal limits. Appendix appears normal. No evidence of bowel wall thickening, distention, or inflammatory changes. Vascular/Lymphatic: Moderate aortic atherosclerosis. No enlarged abdominal or pelvic lymph nodes.  Reproductive: Enlarged uterus with degenerated fibroid with calcification. Bilateral adnexa are unremarkable. Other: No abdominal wall hernia or abnormality. No abdominopelvic ascites. Musculoskeletal: No acute or significant osseous findings. IMPRESSION: 1. No CT evidence of acute abdominal/pelvic process. Normal appendix. 2. Enlarged uterus with degenerated fibroid with calcification. 3. Moderate aortic atherosclerosis. Electronically Signed   By: Larose Hires D.O.   On: 08/18/2022 14:38     Medications:    anticoagulant sodium citrate     cefTRIAXone (ROCEPHIN)  IV Stopped (08/19/22 1711)    arformoterol  15 mcg Nebulization Q12H   aspirin EC  81 mg Oral Daily   atorvastatin  40 mg Oral Daily   budesonide  0.5 mg Nebulization BID   Chlorhexidine Gluconate Cloth  6 each Topical Q0600   cholecalciferol  1,000 Units Oral Daily   escitalopram  10 mg Oral Daily   heparin  5,000 Units Subcutaneous Q8H   insulin aspart  0-9 Units Subcutaneous TID WC   insulin glargine-yfgn  15 Units Subcutaneous Daily   montelukast  10 mg Oral QHS   pantoprazole  40 mg Oral Daily   sevelamer carbonate  2.4 g Oral TID WC   acetaminophen, albuterol, alteplase, anticoagulant sodium citrate, fentaNYL (SUBLIMAZE) injection, heparin, lidocaine (PF), lidocaine-prilocaine, midodrine, ondansetron **OR** ondansetron (ZOFRAN) IV, oxybutynin, oxyCODONE, pentafluoroprop-tetrafluoroeth, pregabalin  Assessment/ Plan:  [name] is a 52 y.o.  female with a past medical history of end-stage renal disease, patient is on Tuesday Thursday Saturday schedule, diabetes mellitus, history of pancreatitis who came to the ER with chief complaint of abdominal pain with foul smelling urine for 2 weeks.  [facility] TTS Left AVF 127.5kg   End stage renal disease on hemodialysis.  Receiving dialysis treatment today, UF goal 2 to 2.5 L as tolerated.  Next treatment scheduled for Thursday.  2. Anemia of chronic kidney  disease Lab Results  Component Value Date   HGB 13.5 08/20/2022    Hgb within desired range.   3. Secondary Hyperparathyroidism: with outpatient labs: PTH 2361, phosphorus 9.0, calcium 8.4 on 08/06/22.   Lab Results  Component Value Date   PTH 134 (H) 04/21/2017   CALCIUM 7.8 (L) 08/20/2022   CAION 0.81 (LL) 10/26/2020   PHOS 8.2 (H) 04/20/2022    Patient prescribed cholecalciferol outpatient.  B continue sevelamer with meals  4. Diabetes mellitus type II with chronic kidney disease/renal manifestations: noninsulin dependent. Most recent hemoglobin A1c is 7.8 on 08/18/22.   Patient hypoglycemic this morning to 69.  Treated with snacks and recovered to 77.  Will continue to spot check glucose during dialysis.   LOS: 2 Kaylani Fromme 6/25/202411:57 AM

## 2022-08-20 NOTE — Inpatient Diabetes Management (Signed)
Inpatient Diabetes Program Recommendations  AACE/ADA: New Consensus Statement on Inpatient Glycemic Control (2015)  Target Ranges:  Prepandial:   less than 140 mg/dL      Peak postprandial:   less than 180 mg/dL (1-2 hours)      Critically ill patients:  140 - 180 mg/dL   Lab Results  Component Value Date   GLUCAP 132 (H) 08/20/2022   HGBA1C 7.8 (H) 08/18/2022    Review of Glycemic Control  Latest Reference Range & Units 08/19/22 16:20 08/19/22 21:04 08/20/22 07:54 08/20/22 08:50 08/20/22 09:12  Glucose-Capillary 70 - 99 mg/dL 914 (H)  Novolog 3 units 144 (H) 69 (L) 77 132 (H)  (H): Data is abnormally high (L): Data is abnormally low  Diabetes history: DM Outpatient Diabetes medications: Tresiba 20 units QHS Current orders for Inpatient glycemic control: Semglee 20 units at bedtime, Novolog 0-9 units TID  Inpatient Diabetes Program Recommendations:    Mild low this morning of 69 mg/dL.  Please consider:  Semglee 15 units every day Novolog 0-6 units TID  Will continue to follow while inpatient.  Thank you, Dulce Sellar, MSN, CDCES Diabetes Coordinator Inpatient Diabetes Program [phone] (team pager from 8a-5p)

## 2022-08-20 NOTE — Progress Notes (Signed)
Notes , studies and verbal discussion with CNM MAckie  for this pt .  2 week h/o pelvic and right sided pain . Tx as outpt with Ciprofloxacin for 2 week . No improvement . Pt c/o malodor of urine  and she was admitted 2 days ago and started on ABX IV .  Pt has been menopausal for 3 yrs and she has intermittent post coital bleeding .CNM Mackie exam significant for abn vaginal d/c and ++ TTP  No documented pap in several yrs . CTscan and abd u/s not definitive for gyn pathology except for possible degenerating fibroid. Elevated WBC 22K. Pt with multiple medical problems  A: possible pyometra  , possible cervical cancer P; I have spoken to the patient and her husband about the role for pap , cx biopsies and fractional D+C  I will speak to Dr Para March about her heparin dosing and DM . OR is aware of the pts multiple med problems and Anesthesiology to eval prio surgery . IV sedation may be the best option ,Risks of the procedure discussed and consents signed . All questions answered .

## 2022-08-20 NOTE — Progress Notes (Addendum)
       CROSS COVER NOTE  NAME: [name] MRN: [id number] DOB : [birth year]       Contacted by GYN, Dr. Feliberto Gottron who plans to take patient to the OR for fractional D+C and pap and cervical biopsies tomorrow approx 1400 on 6/26.  Suspected diagnosis: Pyometra or cervical cancer    Highlights of conversation as follows: 1.  N.p.o. from 4 AM and adjust insulin to suit 2.  Hold subcu heparin after nighttime dose 3.  Ancef for surgical prophylaxis on being called to the OR 4.  Procedure should last 15 minutes 5.  He anticipates he may get an anesthesia consult given patient's comorbidities to see if spinal might be a better option for anesthesia 6.  We verified that patient had dialysis done today 7.  Dr. Feliberto Gottron would like to touch base early with the incoming hospitalist on duty on 6/27     Pertinent findings on chart review: Last progress note reviewed. (Previously received as signout from Dr. Osvaldo Shipper )  Assessment and  Interventions   Assessment: GYN procedure in a.m.   Plan: I spoke with pharmacy who will adjust Ancef dosing Heparin held Placed n.p.o.

## 2022-08-20 NOTE — Progress Notes (Signed)
Hemodialysis note  Received patient in bed to unit. Alert and oriented.  Informed consent signed and in chart.  Treatment initiated: 0981 Treatment completed: 1234  Patient tolerated well. Transported back to room, alert without acute distress.  Report given to patient's RN.   Access used: LUA AVF Access issues: none  Total UF removed: 2L Medication(s) given:  Midodrine 20 mg tab PO  Post HD weight: 117.9 kg   Wolfgang Phoenix [facility]

## 2022-08-21 ENCOUNTER — Inpatient Hospital Stay: Payer: 59 | Admitting: Anesthesiology

## 2022-08-21 ENCOUNTER — Inpatient Hospital Stay: Payer: 59

## 2022-08-21 ENCOUNTER — Other Ambulatory Visit: Payer: Self-pay

## 2022-08-21 ENCOUNTER — Ambulatory Visit: Payer: 59 | Admitting: Anesthesiology

## 2022-08-21 ENCOUNTER — Encounter
Admission: EM | Disposition: E | Payer: Self-pay | Source: Home / Self Care | Attending: Student in an Organized Health Care Education/Training Program

## 2022-08-21 ENCOUNTER — Encounter: Payer: Self-pay | Admitting: Internal Medicine

## 2022-08-21 DIAGNOSIS — N3000 Acute cystitis without hematuria: Secondary | ICD-10-CM | POA: Diagnosis not present

## 2022-08-21 HISTORY — PX: CERVICAL CONIZATION W/BX: SHX1330

## 2022-08-21 LAB — GLUCOSE, CAPILLARY
Glucose-Capillary: 101 mg/dL — ABNORMAL HIGH (ref 70–99)
Glucose-Capillary: 109 mg/dL — ABNORMAL HIGH (ref 70–99)
Glucose-Capillary: 140 mg/dL — ABNORMAL HIGH (ref 70–99)
Glucose-Capillary: 128 mg/dL — ABNORMAL HIGH (ref 70–99)
Glucose-Capillary: 109 mg/dL — ABNORMAL HIGH (ref 70–99)
Glucose-Capillary: 124 mg/dL — ABNORMAL HIGH (ref 70–99)
Glucose-Capillary: 123 mg/dL — ABNORMAL HIGH (ref 70–99)

## 2022-08-21 LAB — COMPREHENSIVE METABOLIC PANEL
ALT: 50 U/L — ABNORMAL HIGH (ref 0–44)
AST: 128 U/L — ABNORMAL HIGH (ref 15–41)
Albumin: 3.4 g/dL — ABNORMAL LOW (ref 3.5–5.0)
Alkaline Phosphatase: 594 U/L — ABNORMAL HIGH (ref 38–126)
Anion gap: 16 — ABNORMAL HIGH (ref 5–15)
BUN: 51 mg/dL — ABNORMAL HIGH (ref 6–20)
CO2: 22 mmol/L (ref 22–32)
Calcium: 7.8 mg/dL — ABNORMAL LOW (ref 8.9–10.3)
Chloride: 89 mmol/L — ABNORMAL LOW (ref 98–111)
Creatinine, Ser: 5.89 mg/dL — ABNORMAL HIGH (ref 0.44–1.00)
GFR, Estimated: 8 mL/min — ABNORMAL LOW (ref >60)
Glucose, Bld: 125 mg/dL — ABNORMAL HIGH (ref 70–99)
Potassium: 5 mmol/L (ref 3.5–5.1)
Sodium: 127 mmol/L — ABNORMAL LOW (ref 135–145)
Total Bilirubin: 2.5 mg/dL — ABNORMAL HIGH (ref 0.3–1.2)
Total Protein: 7.6 g/dL (ref 6.5–8.1)

## 2022-08-21 LAB — CBC
HCT: 44.6 % (ref 36.0–46.0)
Hemoglobin: 14 g/dL (ref 12.0–15.0)
MCH: 30.2 pg (ref 26.0–34.0)
MCHC: 31.4 g/dL (ref 30.0–36.0)
MCV: 96.1 fL (ref 80.0–100.0)
Platelets: 325 10*3/uL (ref 150–400)
RBC: 4.64 MIL/uL (ref 3.87–5.11)
RDW: 19.8 % — ABNORMAL HIGH (ref 11.5–15.5)
WBC: 23.8 10*3/uL — ABNORMAL HIGH (ref 4.0–10.5)
nRBC: 1.5 % — ABNORMAL HIGH (ref 0.0–0.2)

## 2022-08-21 LAB — BLOOD GAS, ARTERIAL
Acid-base deficit: 9.4 mmol/L — ABNORMAL HIGH (ref 0.0–2.0)
Bicarbonate: 20.6 mmol/L (ref 20.0–28.0)
O2 Saturation: 98.8 %
Patient temperature: 37
pCO2 arterial: 62 mmHg — ABNORMAL HIGH (ref 32–48)
pH, Arterial: 7.13 — CL (ref 7.35–7.45)
pO2, Arterial: 127 mmHg — ABNORMAL HIGH (ref 83–108)

## 2022-08-21 SURGERY — CONE BIOPSY, CERVIX
Anesthesia: General | Site: Vagina

## 2022-08-21 MED ORDER — FERRIC SUBSULFATE 259 MG/GM EX SOLN
CUTANEOUS | Status: AC
  Filled 2022-08-21: qty 8

## 2022-08-21 MED ORDER — SODIUM CHLORIDE 0.9 % IV SOLN: INTRAVENOUS | Status: DC | PRN

## 2022-08-21 MED ORDER — OXYCODONE HCL 5 MG PO TABS
5.0000 mg | ORAL_TABLET | Freq: Once | ORAL | Status: AC
  Administered 2022-08-21: 5 mg via ORAL

## 2022-08-21 MED ORDER — FUROSEMIDE 10 MG/ML IJ SOLN
40.0000 mg | Freq: Once | INTRAMUSCULAR | Status: AC
  Administered 2022-08-21: 40 mg via INTRAVENOUS

## 2022-08-21 MED ORDER — FUROSEMIDE 10 MG/ML IJ SOLN
INTRAMUSCULAR | Status: AC
  Filled 2022-08-21: qty 4

## 2022-08-21 MED ORDER — IPRATROPIUM-ALBUTEROL 0.5-2.5 (3) MG/3ML IN SOLN
3.0000 mL | Freq: Once | RESPIRATORY_TRACT | Status: AC
  Administered 2022-08-21: 3 mL via RESPIRATORY_TRACT

## 2022-08-21 MED ORDER — PROPOFOL 1000 MG/100ML IV EMUL
INTRAVENOUS | Status: AC
  Filled 2022-08-21: qty 100

## 2022-08-21 MED ORDER — OXYCODONE HCL 5 MG PO TABS
ORAL_TABLET | ORAL | Status: AC
  Filled 2022-08-21: qty 1

## 2022-08-21 MED ORDER — FLUMAZENIL 0.5 MG/5ML IV SOLN
0.3000 mg | Freq: Once | INTRAVENOUS | Status: AC
  Administered 2022-08-21: 0.3 mg via INTRAVENOUS

## 2022-08-21 MED ORDER — PROPOFOL 500 MG/50ML IV EMUL
INTRAVENOUS | Status: DC | PRN
  Administered 2022-08-21: 80 ug/kg/min via INTRAVENOUS

## 2022-08-21 MED ORDER — FLUCONAZOLE 100 MG PO TABS
100.0000 mg | ORAL_TABLET | Freq: Every day | ORAL | Status: DC
  Administered 2022-08-23 – 2022-08-25 (×4): 100 mg via ORAL
  Filled 2022-08-21 (×8): qty 1

## 2022-08-21 MED ORDER — FERRIC SUBSULFATE 259 MG/GM EX SOLN
CUTANEOUS | Status: DC | PRN
  Administered 2022-08-21: 1

## 2022-08-21 MED ORDER — IPRATROPIUM-ALBUTEROL 0.5-2.5 (3) MG/3ML IN SOLN
RESPIRATORY_TRACT | Status: AC
  Filled 2022-08-21: qty 3

## 2022-08-21 MED ORDER — IODINE STRONG (LUGOLS) 5 % PO SOLN
ORAL | Status: AC
  Filled 2022-08-21: qty 1

## 2022-08-21 MED ORDER — SODIUM CHLORIDE 0.9 % IV BOLUS
250.0000 mL | Freq: Once | INTRAVENOUS | Status: AC
  Administered 2022-08-21: 250 mL via INTRAVENOUS

## 2022-08-21 MED ORDER — 0.9 % SODIUM CHLORIDE (POUR BTL) OPTIME
TOPICAL | Status: DC | PRN
  Administered 2022-08-21: 500 mL

## 2022-08-21 MED ORDER — FENTANYL CITRATE (PF) 100 MCG/2ML IJ SOLN: 25.0000 ug | INTRAMUSCULAR | Status: DC | PRN

## 2022-08-21 MED ORDER — LIDOCAINE-EPINEPHRINE 1 %-1:100000 IJ SOLN
INTRAMUSCULAR | Status: AC
  Filled 2022-08-21: qty 1

## 2022-08-21 MED ORDER — MIDAZOLAM HCL 2 MG/2ML IJ SOLN
INTRAMUSCULAR | Status: DC | PRN
  Administered 2022-08-21 (×2): 1 mg via INTRAVENOUS

## 2022-08-21 MED ORDER — FLUMAZENIL 0.5 MG/5ML IV SOLN
INTRAVENOUS | Status: AC
  Filled 2022-08-21: qty 5

## 2022-08-21 MED ORDER — MIDAZOLAM HCL 2 MG/2ML IJ SOLN
INTRAMUSCULAR | Status: AC
  Filled 2022-08-21: qty 2

## 2022-08-21 SURGICAL SUPPLY — 55 items
APL SWBSTK 6 STRL LF DISP (MISCELLANEOUS) ×2
APPLICATOR COTTON TIP 6 STRL (MISCELLANEOUS) ×2 IMPLANT
APPLICATOR COTTON TIP 6IN STRL (MISCELLANEOUS) ×2 IMPLANT
APPLICATOR SWAB PROCTO LG 16IN (MISCELLANEOUS) ×2 IMPLANT
CATH ROBINSON RED A/P 16FR (CATHETERS) ×2 IMPLANT
CNTNR URN SCR LID CUP LEK RST (MISCELLANEOUS) ×2 IMPLANT
CONT SPEC 4OZ STRL OR WHT (MISCELLANEOUS) ×2
DEPRESSOR TONGUE 6 IN STERILE (GAUZE/BANDAGES/DRESSINGS) IMPLANT
DRAPE PERI LITHO V/GYN (MISCELLANEOUS) ×2 IMPLANT
DRSG TELFA 3X8 NADH STRL (GAUZE/BANDAGES/DRESSINGS) IMPLANT
ELECT LEEP 2.0X0.8 R2008 (MISCELLANEOUS) ×2
ELECT LEEP BALL 5MM 12CM (MISCELLANEOUS) ×2
ELECT LOOP 1.0X1.0CM R1010 (MISCELLANEOUS) ×2
ELECT REM PT RETURN 9FT ADLT (ELECTROSURGICAL) ×2
ELECTRODE LEEP 2.0X0.8 R2008 (MISCELLANEOUS) ×2 IMPLANT
ELECTRODE LEEP BALL 5MM 12CM (MISCELLANEOUS) ×2 IMPLANT
ELECTRODE LOOP 1.0X1.0CM R1010 (MISCELLANEOUS) ×2 IMPLANT
ELECTRODE REM PT RTRN 9FT ADLT (ELECTROSURGICAL) ×2 IMPLANT
FILTER UTR ASPR ASSEMBLY (MISCELLANEOUS) ×2 IMPLANT
FILTER UTR ASPR SPEC (MISCELLANEOUS) ×2 IMPLANT
FLTR UTR ASPR SPEC (MISCELLANEOUS) ×2
GAUZE 4X4 16PLY ~~LOC~~+RFID DBL (SPONGE) ×2 IMPLANT
GLOVE SURG SYN 8.0 (GLOVE) ×2 IMPLANT
GLOVE SURG SYN 8.0 PF PI (GLOVE) ×1 IMPLANT
GOWN STRL REUS W/ TWL LRG LVL3 (GOWN DISPOSABLE) ×2 IMPLANT
GOWN STRL REUS W/ TWL XL LVL3 (GOWN DISPOSABLE) ×2 IMPLANT
GOWN STRL REUS W/TWL LRG LVL3 (GOWN DISPOSABLE) ×2
GOWN STRL REUS W/TWL XL LVL3 (GOWN DISPOSABLE) ×2
KIT BERKELEY 1ST TRIMESTER 3/8 (MISCELLANEOUS) ×2 IMPLANT
KIT TURNOVER CYSTO (KITS) ×2 IMPLANT
MANIFOLD NEPTUNE II (INSTRUMENTS) ×2 IMPLANT
NDL HYPO 30X.5 LL (NEEDLE) IMPLANT
NDL SPNL 22GX3.5 QUINCKE BK (NEEDLE) ×1 IMPLANT
NEEDLE HYPO 30X.5 LL (NEEDLE) IMPLANT
NEEDLE SPNL 22GX3.5 QUINCKE BK (NEEDLE) ×2 IMPLANT
PACK BASIN MINOR ARMC (MISCELLANEOUS) ×1 IMPLANT
PACK DNC HYST (MISCELLANEOUS) ×2 IMPLANT
PAD OB MATERNITY 4.3X12.25 (PERSONAL CARE ITEMS) ×2 IMPLANT
PAD PREP OB/GYN DISP 24X41 (PERSONAL CARE ITEMS) ×2 IMPLANT
SCRUB CHG 4% DYNA-HEX 4OZ (MISCELLANEOUS) ×2 IMPLANT
SET BERKELEY SUCTION TUBING (SUCTIONS) ×2 IMPLANT
SET CYSTO W/LG BORE CLAMP LF (SET/KITS/TRAYS/PACK) IMPLANT
STRAW SMOKE EVAC LEEP 6150 NON (MISCELLANEOUS) ×2 IMPLANT
SUT VIC AB 2-0 SH 27 (SUTURE) ×2
SUT VIC AB 2-0 SH 27XBRD (SUTURE) ×1 IMPLANT
SYR CONTROL 10ML LL (SYRINGE) ×2 IMPLANT
TOWEL OR 17X26 4PK STRL BLUE (TOWEL DISPOSABLE) ×2 IMPLANT
TRAP FLUID SMOKE EVACUATOR (MISCELLANEOUS) ×2 IMPLANT
TRAP TISSUE FILTER (MISCELLANEOUS) ×2 IMPLANT
VACURETTE 10 RIGID CVD (CANNULA) ×2 IMPLANT
VACURETTE 6 ASPIR F TIP BERK (CANNULA) ×2 IMPLANT
VACURETTE 7MM F TIP STRL (CANNULA) ×2 IMPLANT
VACURETTE 8 RIGID CVD (CANNULA) ×2 IMPLANT
VACURETTE 8MM F TIP (MISCELLANEOUS) ×2 IMPLANT
WATER STERILE IRR 500ML POUR (IV SOLUTION) ×2 IMPLANT

## 2022-08-21 NOTE — Op Note (Signed)
NAME: [name], Wike MEDICAL RECORD NO: [id number] ACCOUNT NO: [id number] DATE OF BIRTH: May 24, 1970 FACILITY: [facility] LOCATION: [facility] PHYSICIAN: Suzy Bouchard, MD  Operative Report   PREOPERATIVE DIAGNOSES: 1.  Pelvic pain. 2.  Abnormal vaginal discharge.  POSTOPERATIVE DIAGNOSES: 1.  Pelvic pain. 2.  Cervical cancer.  PROCEDURE: 1.  Examination under anesthesia. 2.  Pap smear. 3.  Cervical biopsies.  SURGEON:  Suzy Bouchard, MD  INDICATIONS:  A 52 year old female with a 2 week history of pelvic pain, admitted by the hospitalist at [facility]. The patient is an unassigned patient and gynecologic consultation was requested.  The patient has had abnormal  vaginal discharge and irregular, firm cervix on exam by certified nurse midwife the day before the procedure.  DESCRIPTION OF PROCEDURE:  After adequate IV sedation, the patient was placed in a supine position with the legs in the [place] stirrups.  A Pap smear was performed initially followed by a perineal and vaginal prep.  Timeout was performed.  A speculum  placed and of note, purulent vaginal discharge with necrotic cervical tissue noted.  Multiple cervical biopsies performed.  A flap of the necrotic tissue was removed as well.  Monsel's solution was used and for 1 persistent biopsy site, a 2-0 Vicryl  suture was placed to control bleeding.  Good hemostasis noted at the end.  Of note, straight catheterization of the bladder at the beginning of the procedure yielded no urine.  The patient does have a history of chronic kidney disease.  COMPLICATIONS:  There were no complications.  ESTIMATED BLOOD LOSS:  10 mL.  INTRAOPERATIVE FLUIDS:  100 mL.  URINE OUTPUT:  None.  DISPOSITION:  The patient was taken to recovery room in good condition.   NIK D: 08/21/2022 4:38:06 pm T: 08/21/2022 9:26:00 pm  JOB: [id number]/ [id number]

## 2022-08-21 NOTE — Hospital Course (Addendum)
52 y.o. female with medical history significant of  ESRD on HD TTS  with minimal urine out put, Asthma/COPD/OSA/OSH intolerant to bipap, Chronic respiratory failure on home O2 4L,  HTN, Hfpef, anxiety,  DMII, obesity, chronic wound right foot followed by wound care presented to ED with interim hx of dysuria as well as foul odor associated with her urine.Patient has been seen for this as outpatient and was placed on oral antibiotics x 2 weeks and still having symptoms. Patient notes that over the last few days she has had increase lower abdominal pain /pressure with radiation towards her flank area.  UA was noted to be abnormal.  CT scan did not show any acute findings. Labs showed BUN 45 creatinine 5.6 bicarb 22 normal LFTs, leukocytosis at 18.5 A1c 7.8 TSH 2.5 UA abnormal WBC more than 50 leukocyte moderate nitrate negative, urine culture sent and patient admitted on IV antibiotics for failed outpatient treatment

## 2022-08-21 NOTE — Care Management Important Message (Signed)
Important Message  Patient Details  Name: [name] MRN: [id number] Date of Birth: May 23, 1970   Medicare Important Message Given:  N/A - LOS <3 / Initial given by admissions     Olegario Messier A [name] 08/21/2022, 8:32 AM

## 2022-08-21 NOTE — Brief Op Note (Signed)
08/21/2022  3:55 PM  PATIENT:  [name]  52 y.o. female  PRE-OPERATIVE DIAGNOSIS:  Pyometra, possible cervical cancer  POST-OPERATIVE DIAGNOSIS:  CERVICAL CANCER  PROCEDURE:  Procedure(s): CERVICAL BIOPSY (N/A) Pap  SURGEON:  Surgeon(s) and Role:    * Yaritsa Savarino, Ihor Austin, MD - Primary  PHYSICIAN ASSISTANT:   ASSISTANTS: none   ANESTHESIA:   IV sedation  EBL:  20 mL   BLOOD ADMINISTERED:none  DRAINS: none   LOCAL MEDICATIONS USED:  NONE  SPECIMEN:  Source of Specimen:  pap, cervical biopsies   DISPOSITION OF SPECIMEN:  PATHOLOGY  COUNTS:  YES  TOURNIQUET:  * No tourniquets in log *  DICTATION: .Other Dictation: Dictation Number verbal  PLAN OF CARE: Admit to inpatient   PATIENT DISPOSITION:  PACU - hemodynamically stable.   Delay start of Pharmacological VTE agent (>24hrs) due to surgical blood loss or risk of bleeding: not applicable

## 2022-08-21 NOTE — Transfer of Care (Signed)
Immediate Anesthesia Transfer of Care Note  Patient: [name]  Procedure(s) Performed: CERVICAL BIOPSY (Vagina )  Patient Location: PACU  Anesthesia Type:General  Level of Consciousness: drowsy  Airway & Oxygen Therapy: Super Nova in place at 15L. Sao2 92%. RR 18. Pt tolerating the nasal trumpet that was placed with Lidocaine ointment in right nare and CPAP from super nova system.  Post-op Assessment: Report given to RN and Post -op Vital signs reviewed and stable  Post vital signs: stable  Last Vitals:  Vitals Value Taken Time  BP 89/58 08/21/22 1605  Temp    Pulse 81 08/21/22 1611  Resp 18 08/21/22 1611  SpO2 92 % 08/21/22 1611  Vitals shown include unvalidated device data.  Last Pain:  Vitals:   08/21/22 1350  TempSrc: Oral  PainSc: 0-No pain      Patients Stated Pain Goal: 0 (08/19/22 1452)  Complications: No notable events documented.

## 2022-08-21 NOTE — Progress Notes (Signed)
Pt ready for pap , cxbx  as needed and fractional d+c . NPO . Glucose 109 . All questions answered proceed

## 2022-08-21 NOTE — Anesthesia Preprocedure Evaluation (Signed)
Anesthesia Evaluation  Patient identified by MRN, date of birth, ID band Patient awake  General Assessment Comment:Patient appears drowsy, says she took xanax recently. Appears anxious about procedure and anesthetic.  Reviewed: Allergy & Precautions, NPO status , [name] Chart, lab work & pertinent test results  History of Anesthesia Complications Negative for: history of anesthetic complications  Airway Mallampati: II  TM Distance: >3 FB Neck ROM: Full    Dental no notable dental hx. (+) Teeth Intact, Dental Advidsory Given   Pulmonary shortness of breath and with exertion, asthma , neg sleep apnea, COPD,  oxygen dependent, Recent URI , Residual Cough, Patient abstained from smoking.Not current smoker On 3-4L/min O2 Denies OSA    + decreased breath sounds      Cardiovascular Exercise Tolerance: Poor METShypertension, Pt. on medications (-) angina + Peripheral Vascular Disease, +CHF and + Orthopnea  (-) CAD and (-) Past MI (-) dysrhythmias  Rhythm:Regular Rate:Normal - Systolic murmurs TTE 2018: Study Conclusions   - Left ventricle: The cavity size was mildly dilated. Systolic    function was normal. The estimated ejection fraction was 55%.    Wall motion was normal; there were no regional wall motion    abnormalities. Doppler parameters are consistent with abnormal    left ventricular relaxation (grade 1 diastolic dysfunction).  - Mitral valve: There was mild regurgitation.  - Atrial septum: No defect or patent foramen ovale was identified.    Neuro/Psych  PSYCHIATRIC DISORDERS Anxiety Depression    negative neurological ROS     GI/Hepatic ,neg GERD  ,,(+)     (-) substance abuse    Endo/Other  diabetes, Poorly Controlled, Insulin Dependent  Morbid obesity  Renal/GU ESRF and DialysisRenal diseaseLast dialyzed this morning     Musculoskeletal   Abdominal  (+) + obese  Peds  Hematology   Anesthesia Other  Findings Past Medical History: No date: (HFpEF) heart failure with preserved ejection fraction (HCC)     Comment:  a. 11/2014 Echo: EF 50-55%, Gr1 DD; b. 06/2016 Echo: EF               60-65%, no rwma, mod dil LA, nl RV, triv eff. No date: Anxiety No date: Asthma No date: Bronchitis No date: CHF (congestive heart failure) (HCC) No date: Chronic cough No date: Chronic respiratory failure with hypoxia and hypercapnia  (HCC)     Comment:  a. Home O2.  (2-4L) No date: CKD (chronic kidney disease), stage III (HCC) No date: Depression No date: Diabetes mellitus without complication (HCC) No date: Diverticulosis No date: Dyspnea No date: Dysrhythmia     Comment:  per pt, no arrythmias No date: Environmental and seasonal allergies No date: Extreme obesity with alveolar hypoventilation (HCC) No date: Hypertension No date: Iron deficiency anemia     Comment:  a. chronic blood loss->heavy menses. No date: Kidney failure     Comment:  dialysis tues/thur/sat   ... left arm shunt No date: Orthopnea No date: Oxygen dependent     Comment:  3l/min No date: Pericardial effusion     Comment:  a. 11/2014 CT Chest: Mod effusion; b. 11/2014 Echo:               small to mod circumferential effusion. No hemodynamic               compromise. No date: Pneumonia     Comment:  history No date: Poorly controlled diabetes mellitus (HCC)     Comment:  a. 11/2014 A1c 13.2.  No date: Swelling of both lower extremities  Reproductive/Obstetrics                              Anesthesia Physical Anesthesia Plan  ASA: 4  Anesthesia Plan: General   Post-op Pain Management:    Induction: Intravenous  PONV Risk Score and Plan: 3 and Ondansetron, Propofol infusion, TIVA, Midazolam and Dexamethasone  Airway Management Planned: Natural Airway and Simple Face Mask  Additional Equipment: None  Intra-op Plan:   Post-operative Plan:   Informed Consent: I have reviewed the patients  History and Physical, chart, labs and discussed the procedure including the risks, benefits and alternatives for the proposed anesthesia with the patient or authorized representative who has indicated his/her understanding and acceptance.     Dental advisory given  Plan Discussed with: CRNA and Surgeon  Anesthesia Plan Comments: (Discussed risks of anesthesia with patient, including possibility of difficulty with spontaneous ventilation under anesthesia necessitating airway intervention, PONV, and rare risks such as cardiac or respiratory or neurological events, and allergic reactions. Patient understands. Patient counseled on being higher risk for anesthesia due to comorbidities: ESRD, morbid obesity. Patient was told about increased risk of cardiac and respiratory events, including death. Patient understands. )         Anesthesia Quick Evaluation

## 2022-08-21 NOTE — Progress Notes (Signed)
[facility]  ROUNDING NOTE   Subjective:   [name] is a 52 year old African-American female with a past medical history of end-stage renal disease, patient is on Tuesday Thursday Saturday schedule, diabetes mellitus, history of pancreatitis who came to the ER with chief complaint of abdominal pain with foul smelling urine for 2 weeks.  Patient is known to our clinic and receives outpatient dialysis treatments at [facility] on a TTS schedule, supervised by Dr Thedore Mins.   Patient seen sitting at side of bed Alert and oriented Husband at bedside NPO for procedure  Objective:  Vital signs in last 24 hours:  Temp:  [97.7 F (36.5 C)-98.1 F (36.7 C)] 97.7 F (36.5 C) (06/26 0812) Pulse Rate:  [76-88] 76 (06/26 0812) Resp:  [15-18] 18 (06/26 0812) BP: (87-177)/(54-161) 125/57 (06/26 0812) SpO2:  [92 %-100 %] 98 % (06/26 0812) Weight:  [116.8 kg-117.9 kg] 116.8 kg (06/26 0500)  Weight change: -1.1 kg Filed Weights   08/20/22 0838 08/20/22 1234 08/21/22 0500  Weight: 118.5 kg 117.9 kg 116.8 kg    Intake/Output: I/O last 3 completed shifts: In: 591.2 [P.O.:240; IV Piggyback:351.2] Out: 2000 [Other:2000]   Intake/Output this shift:  No intake/output data recorded.  Physical Exam: General: NAD  Head: Normocephalic, atraumatic. Moist oral mucosal membranes  Eyes: Anicteric  Lungs:  Clear to auscultation, NCO2  Heart: Regular rate and rhythm  Abdomen:  Soft, nontender  Extremities:  No peripheral edema.  Neurologic: Alert and oriented, moving all four extremities  Skin: Chronic right foot wound  Access: Lt AVF     Basic Metabolic Panel: Recent Labs  Lab 08/18/22 1211 08/19/22 0326 08/20/22 0536 08/21/22 0503  NA 133* 132* 128* 127*  K 3.9 4.5 5.3* 5.0  CL 96* 94* 90* 89*  CO2 22 20* 20* 22  GLUCOSE 208* 160* 94 125*  BUN 45* 52* 70* 51*  CREATININE 5.65* 6.25* 7.35* 5.89*  CALCIUM 7.1* 7.3* 7.8* 7.8*     Liver Function Tests: Recent  Labs  Lab 08/18/22 1211 08/19/22 0326 08/20/22 0536 08/21/22 0503  AST 33 36 26 128*  ALT 26 26 23  50*  ALKPHOS 592* 593* 585* 594*  BILITOT 1.3* 1.4* 1.8* 2.5*  PROT 7.4 7.8 7.7 7.6  ALBUMIN 3.2* 3.3* 3.3* 3.4*    No results for input(s): "LIPASE", "AMYLASE" in the last 168 hours. No results for input(s): "AMMONIA" in the last 168 hours.  CBC: Recent Labs  Lab 08/18/22 1211 08/19/22 0326 08/20/22 0536 08/21/22 0503  WBC 18.5* 17.3* 21.4* 23.8*  NEUTROABS 15.7*  --   --   --   HGB 13.1 13.4 13.5 14.0  HCT 40.6 43.0 43.5 44.6  MCV 93.1 95.3 94.6 96.1  PLT 323 273 305 325     Cardiac Enzymes: No results for input(s): "CKTOTAL", "CKMB", "CKMBINDEX", "TROPONINI" in the last 168 hours.  BNP: Invalid input(s): "POCBNP"  CBG: Recent Labs  Lab 08/20/22 2024 08/21/22 0319 08/21/22 0341 08/21/22 0813 08/21/22 1100  GLUCAP 82 101* 109* 140* 128*     Microbiology: Results for orders placed or performed during the hospital encounter of 08/18/22  Urine Culture     Status: Abnormal   Collection Time: 08/18/22  3:07 PM   Specimen: Urine, Clean Catch  Result Value Ref Range Status   Specimen Description   Final    URINE, CLEAN CATCH Performed at [facility], [address], [name], [place] [address]    Special Requests   Final  NONE Performed at [facility], [address] [place], [place] [address]    Culture >=100,000 COLONIES/mL YEAST (A)  Final   Report Status 08/20/2022 FINAL  Final  Wet prep, genital     Status: Abnormal   Collection Time: 08/20/22  6:35 PM  Result Value Ref Range Status   Yeast Wet Prep HPF POC NONE SEEN NONE SEEN Final   Trich, Wet Prep NONE SEEN NONE SEEN Final   Clue Cells Wet Prep HPF POC NONE SEEN NONE SEEN Final   WBC, Wet Prep HPF POC >=10 (A) <10 Final   Sperm NONE SEEN  Final    Comment: Performed at [facility], [address] [place], [place] [address]  Chlamydia/NGC rt PCR [facility] only)      Status: None   Collection Time: 08/20/22  6:35 PM   Specimen: Cervical/Vaginal swab; Genital  Result Value Ref Range Status   Specimen source GC/Chlam ENDOCERVICAL  Final   Chlamydia Tr NOT DETECTED NOT DETECTED Final   N gonorrhoeae NOT DETECTED NOT DETECTED Final    Comment: (NOTE) This CT/NG assay has not been evaluated in patients with a history of  hysterectomy. Performed at [facility], [address] [place], [place] [address]     Coagulation Studies: No results for input(s): "LABPROT", "INR" in the last 72 hours.  Urinalysis: Recent Labs    08/18/22 1507  COLORURINE YELLOW*  LABSPEC 1.021  PHURINE 6.0  GLUCOSEU NEGATIVE  HGBUR MODERATE*  BILIRUBINUR NEGATIVE  KETONESUR NEGATIVE  PROTEINUR 100*  NITRITE NEGATIVE  LEUKOCYTESUR MODERATE*       Imaging: No results found.   Medications:    anticoagulant sodium citrate      ceFAZolin (ANCEF) IV     Followed by    ceFAZolin (ANCEF) IV     cefTRIAXone (ROCEPHIN)  IV 200 mL/hr at 08/20/22 1608   doxycycline (VIBRAMYCIN) IV 100 mg (08/21/22 2130)   metronidazole 500 mg (08/21/22 0546)    arformoterol  15 mcg Nebulization Q12H   aspirin EC  81 mg Oral Daily   atorvastatin  40 mg Oral Daily   budesonide  0.5 mg Nebulization BID   Chlorhexidine Gluconate Cloth  6 each Topical Q0600   cholecalciferol  1,000 Units Oral Daily   escitalopram  10 mg Oral Daily   insulin aspart  0-9 Units Subcutaneous Q4H   [START ON 08/22/2022] insulin glargine-yfgn  15 Units Subcutaneous Daily   montelukast  10 mg Oral QHS   pantoprazole  40 mg Oral Daily   sevelamer carbonate  2.4 g Oral TID WC   acetaminophen, albuterol, alteplase, anticoagulant sodium citrate, fentaNYL (SUBLIMAZE) injection, heparin, lidocaine (PF), lidocaine-prilocaine, midodrine, ondansetron **OR** ondansetron (ZOFRAN) IV, oxybutynin, oxyCODONE, pentafluoroprop-tetrafluoroeth, pregabalin  Assessment/ Plan:  [name] is a 52 y.o.   female with a past medical history of end-stage renal disease, patient is on Tuesday Thursday Saturday schedule, diabetes mellitus, history of pancreatitis who came to the ER with chief complaint of abdominal pain with foul smelling urine for 2 weeks.  [facility] TTS Left AVF 127.5kg   End stage renal disease on hemodialysis.  Dialysis received yesterday, UF 2L achieved.  Next treatment scheduled for Thursday.  2. Anemia of chronic kidney disease Lab Results  Component Value Date   HGB 14.0 08/21/2022    Hgb above goal.   3. Secondary Hyperparathyroidism: with outpatient labs: PTH 2361, phosphorus 9.0, calcium 8.4 on 08/06/22.   Lab Results  Component  Value Date   PTH 134 (H) 04/21/2017   CALCIUM 7.8 (L) 08/21/2022   CAION 0.81 (LL) 10/26/2020   PHOS 8.2 (H) 04/20/2022    Patient prescribed cholecalciferol outpatient.  Continue cholecalciferol and sevelamer with meals  4. Diabetes mellitus type II with chronic kidney disease/renal manifestations: noninsulin dependent. Most recent hemoglobin A1c is 7.8 on 08/18/22.   Glucose well controlled, SSI managed by primary team   LOS: 3 Truly Stankiewicz 6/26/202411:21 AM

## 2022-08-21 NOTE — Progress Notes (Signed)
BIPAP: Patient refused therapy at this time. Stated unable to tolerate w/o her home mask. Order remains, however device not in room at this time. Mask (large ffm) from use earlier today and hose remains on counter in room. Pt found on nasal cannula and w/o distress at this time.

## 2022-08-21 NOTE — OR Nursing (Signed)
Pap smear collected at 1522. [facility] #[id number]

## 2022-08-21 NOTE — Plan of Care (Signed)
Patient with POST OP RESP FAILURE DUE TO ANESTHESIA, placed on biPAP  Patient alert and awake following commands after BiPAP therapy BP (!) 107/51   Pulse 84   Temp 98 F (36.7 C)   Resp 18   Ht 5' 4.5" (1.638 m)   Wt 116.8 kg   LMP 10/14/2012 (Approximate)   SpO2 95%   BMI 43.52 kg/m   VS reviewed, back to baseline oxygen requirement of 4L [place]  No need for SD or ICU transfer  Case Discussed with Dr. Charlesetta Ivory, M.D.  [facility]  Medical Director [facility] Medical Director [facility] Cardio-Pulmonary Department

## 2022-08-21 NOTE — Progress Notes (Signed)
PROGRESS NOTE [name]  ZOX:[id number] DOB: 08-Mar-1970 DOA: 08/18/2022 PCP: Orson Eva, NP  Brief Narrative/Hospital Course: 52 y.o. female with medical history significant of  ESRD on HD TTS  with minimal urine out put, Asthma/COPD/OSA/OSH intolerant to bipap, Chronic respiratory failure on home O2 4L,  HTN, Hfpef, anxiety,  DMII, obesity, chronic wound right foot followed by wound care presented to ED with interim hx of dysuria as well as foul odor associated with her urine.Patient has been seen for this as outpatient and was placed on oral antibiotics x 2 weeks and still having symptoms. Patient notes that over the last few days she has had increase lower abdominal pain /pressure with radiation towards her flank area.  UA was noted to be abnormal.  CT scan did not show any acute findings. Labs showed BUN 45 creatinine 5.6 bicarb 22 normal LFTs, leukocytosis at 18.5 A1c 7.8 TSH 2.5 UA abnormal WBC more than 50 leukocyte moderate nitrate negative, urine culture sent and patient admitted on IV antibiotics for failed outpatient treatment    Subjective: Seen and examined this morning Husband at the bedside, complains of some nausea feels gaseous, some back pain and foul smell discharge Overnight afebrile BP in 90s to 120s this morning although 1 episode of 87/59, on 4 L nasal cannula Labs showed sodium 127 potassium 5.0 leukocytosis further up to 23.8 K Gonorrhea chlamydia not detected Wet prep WBC more than 10  Assessment and Plan: Principal Problem:   UTI (urinary tract infection)  UTI failed outpatient treatment Leukocytosis: Although with ESRD does make small amount of urine and symptomatic for about 2 weeks failed outpatient treatment. UA abnormal on admission culture with yeast, has ongoing leukocytosis. CT scan is reassuring does not show any acute finding Right upper quadrant not suggestive of cholecystitis does have mildly elevated bilirubin and alk phos could be from renal  disease and chronically elevated.  Also having vaginal discharge as gonorrhea GC negative GYN has been consulted.  Cont current abx Ceftriaxone 6/23>, Flagyl 6/25>> and doxycycline 6/25>>.  Pharmacy consulted to add antifungal coverage for yeast   Enlarged uterus with a degenerated fibroid Vaginal discharges: CT scan suggested an enlarged uterus level secondary to fibroid.  Patient now mentions that she has been having vaginal discharge as well.  Pelvic ultrasound was done and was a limited exam but did show heterogeneous uterus consistent with multiple areas of calcification.  Patient was endorsing discomfort, GYN Dr. Feliberto Gottron consulted and for biopsy 6/26 Doxy and Flagyl was added to the regimen to cover for possible PID   ESRD on HD TTS: Nephrology is following. Last HD 6/25   Chronic wound right foot Seen by Wound care in the hospital.  This is followed by outpatient wound center as well on 2-3 day dressing frequency. MRI from February revealed a large plantar lateral forefoot soft tissue ulceration with underlying subacute to chronic osteomyelitis of the first through fourth metatarsal head.No abscesses noted at that time.  Patient denies any new symptoms in the right foot at this time.   Chronic respiratory failure with hypoxia pulmonary hypertension and obesity hypoventilation syndrome Reports using BiPAP bedtime at home and 4l [place]. Resumed BIPAP, cont [place] o2 daytime.   History of asthma Stable. Not wheezing.   Chronic hypotension BP stable, noted to be on midodrine prn can use if neeeded   Chronic diastolic CHF Volume status overall stable continue to manage with dialysis.     History of anxiety disorder Mood is stable continue  home Lexapro    Diabetes mellitus type 2 Blood sugar well-controlled continue SSI, decreased dose of glargine  and monitor.HbA1c 7.8. Recent Labs  Lab 08/18/22 1211 08/18/22 2057 08/20/22 2024 08/21/22 0319 08/21/22 0341 08/21/22 0813  08/21/22 1100  GLUCAP  --    < > 82 101* 109* 140* 128*  HGBA1C 7.8*  --   --   --   --   --   --    < > = values in this interval not displayed.     Morbid Obesity:[name] Body mass index is 44.2 kg/m. : Will benefit with PCP follow-up, weight loss.  DVT prophylaxis: scd Code Status:   Code Status: Full Code Family Communication: plan of care discussed with patient/husband at bedside. Patient status is:  inpatient because of UTI Level of care: Telemetry Medical   Dispo: The patient is from: HOME            Anticipated disposition: HOME TBD Objective: Vitals last 24 hrs: Vitals:   08/20/22 2358 08/21/22 0500 08/21/22 0533 08/21/22 0812  BP: (!) 102/54  96/79 (!) 125/57  Pulse: 80  78 76  Resp: 18  18 18   Temp: 97.7 F (36.5 C)  97.7 F (36.5 C) 97.7 F (36.5 C)  TempSrc: Oral  Oral   SpO2: 92%  98% 98%  Weight:  116.8 kg     Weight change: -1.1 kg  Physical Examination: General exam: alert awake, older than stated age HEENT:Oral mucosa moist, Ear/Nose WNL grossly Respiratory system: bilaterally DIMINISHED BS, no use of accessory muscle Cardiovascular system: S1 & S2 +, No JVD. Gastrointestinal system: Abdomen soft, obese, nontender, BS+ Nervous System:Alert, awake, moving extremities. Extremities: LE edema NEG,distal peripheral pulses palpable.  Skin: No rashes,no icterus. MSK: Normal muscle bulk,tone, power  Medications reviewed:  Scheduled Meds:  arformoterol  15 mcg Nebulization Q12H   aspirin EC  81 mg Oral Daily   atorvastatin  40 mg Oral Daily   budesonide  0.5 mg Nebulization BID   Chlorhexidine Gluconate Cloth  6 each Topical Q0600   cholecalciferol  1,000 Units Oral Daily   escitalopram  10 mg Oral Daily   fluconazole  100 mg Oral Daily   insulin aspart  0-9 Units Subcutaneous Q4H   [START ON 08/22/2022] insulin glargine-yfgn  15 Units Subcutaneous Daily   montelukast  10 mg Oral QHS   pantoprazole  40 mg Oral Daily   sevelamer carbonate  2.4 g  Oral TID WC  Continuous Infusions:  anticoagulant sodium citrate      ceFAZolin (ANCEF) IV     Followed by    ceFAZolin (ANCEF) IV     cefTRIAXone (ROCEPHIN)  IV 200 mL/hr at 08/20/22 1608   doxycycline (VIBRAMYCIN) IV 100 mg (08/21/22 2952)   metronidazole 500 mg (08/21/22 0546)    Diet Order             Diet NPO time specified  Diet effective 0500 tomorrow                    Intake/Output Summary (Last 24 hours) at 08/21/2022 1205 Last data filed at 08/20/2022 1853 Gross per 24 hour  Intake 351.22 ml  Output 2000 ml  Net -1648.78 ml   Net IO Since Admission: -1,067.62 mL [08/21/22 1205]  Wt Readings from Last 3 Encounters:  08/21/22 116.8 kg  07/15/22 119.7 kg  06/10/22 119.7 kg     Unresulted Labs (From admission, onward)     Start  Ordered   08/20/22 0500  CBC  Daily,   R      08/19/22 1032   08/20/22 0500  Comprehensive metabolic panel  Daily,   R      08/19/22 1032          Data Reviewed: I have personally reviewed following labs and imaging studies CBC: Recent Labs  Lab 08/18/22 1211 08/19/22 0326 08/20/22 0536 08/21/22 0503  WBC 18.5* 17.3* 21.4* 23.8*  NEUTROABS 15.7*  --   --   --   HGB 13.1 13.4 13.5 14.0  HCT 40.6 43.0 43.5 44.6  MCV 93.1 95.3 94.6 96.1  PLT 323 273 305 325   Basic Metabolic Panel: Recent Labs  Lab 08/18/22 1211 08/19/22 0326 08/20/22 0536 08/21/22 0503  NA 133* 132* 128* 127*  K 3.9 4.5 5.3* 5.0  CL 96* 94* 90* 89*  CO2 22 20* 20* 22  GLUCOSE 208* 160* 94 125*  BUN 45* 52* 70* 51*  CREATININE 5.65* 6.25* 7.35* 5.89*  CALCIUM 7.1* 7.3* 7.8* 7.8*  GFR: Estimated Creatinine Clearance: 14.2 mL/min (A) (by C-G formula based on SCr of 5.89 mg/dL (H)). Recent Labs  Lab 08/18/22 1211 08/19/22 0326 08/20/22 0536 08/21/22 0503  AST 33 36 26 128*  ALT 26 26 23  50*  ALKPHOS 592* 593* 585* 594*  BILITOT 1.3* 1.4* 1.8* 2.5*  PROT 7.4 7.8 7.7 7.6  ALBUMIN 3.2* 3.3* 3.3* 3.4*   Recent Labs    08/18/22 1211   HGBA1C 7.8*   CBG: Recent Labs  Lab 08/20/22 2024 08/21/22 0319 08/21/22 0341 08/21/22 0813 08/21/22 1100  GLUCAP 82 101* 109* 140* 128*   Lipid Profile: No results for input(s): "CHOL", "HDL", "LDLCALC", "TRIG", "CHOLHDL", "LDLDIRECT" in the last 72 hours. Thyroid Function Tests: Recent Labs    08/18/22 1211  TSH 2.590   Sepsis Labs: No results for input(s): "PROCALCITON", "LATICACIDVEN" in the last 168 hours.  Recent Results (from the past 240 hour(s))  Urine Culture     Status: Abnormal   Collection Time: 08/18/22  3:07 PM   Specimen: Urine, Clean Catch  Result Value Ref Range Status   Specimen Description   Final    URINE, CLEAN CATCH Performed at [facility], [address], [place], [place] [address]    Special Requests   Final    NONE Performed at [facility], [address] [place], [place] [address]    Culture >=100,000 COLONIES/mL YEAST (A)  Final   Report Status 08/20/2022 FINAL  Final  Wet prep, genital     Status: Abnormal   Collection Time: 08/20/22  6:35 PM  Result Value Ref Range Status   Yeast Wet Prep HPF POC NONE SEEN NONE SEEN Final   Trich, Wet Prep NONE SEEN NONE SEEN Final   Clue Cells Wet Prep HPF POC NONE SEEN NONE SEEN Final   WBC, Wet Prep HPF POC >=10 (A) <10 Final   Sperm NONE SEEN  Final    Comment: Performed at [facility], [address] [place], [place] [address]  Chlamydia/NGC rt PCR [facility] only)     Status: None   Collection Time: 08/20/22  6:35 PM   Specimen: Cervical/Vaginal swab; Genital  Result Value Ref Range Status   Specimen source GC/Chlam ENDOCERVICAL  Final   Chlamydia Tr NOT DETECTED NOT DETECTED Final   N gonorrhoeae NOT DETECTED NOT DETECTED Final    Comment: (NOTE) This CT/NG assay has not been evaluated in patients with a  history of  hysterectomy. Performed at [facility], [address] [place], [place] [address]     Antimicrobials: Anti-infectives (From  admission, onward)    Start     Dose/Rate Route Frequency Ordered Stop   08/21/22 1200  fluconazole (DIFLUCAN) tablet 100 mg        100 mg over 7 Days Oral Daily 08/21/22 1143     08/21/22 0000  ceFAZolin (ANCEF) IVPB 3g/100 mL premix  Status:  Discontinued        3 g 200 mL/hr over 30 Minutes Intravenous  Once 08/20/22 2014 08/20/22 2029   08/21/22 0000  ceFAZolin (ANCEF) IVPB 2g/100 mL premix       See Hyperspace for full Linked Orders Report.   2 g 200 mL/hr over 30 Minutes Intravenous  Once 08/20/22 2029     08/21/22 0000  ceFAZolin (ANCEF) IVPB 1 g/50 mL premix       See Hyperspace for full Linked Orders Report.   1 g 100 mL/hr over 30 Minutes Intravenous  Once 08/20/22 2029     08/20/22 1800  doxycycline (VIBRAMYCIN) 100 mg in sodium chloride 0.9 % 250 mL IVPB        100 mg 125 mL/hr over 120 Minutes Intravenous Every 12 hours 08/20/22 1210     08/20/22 1800  metroNIDAZOLE (FLAGYL) IVPB 500 mg        500 mg 100 mL/hr over 60 Minutes Intravenous Every 12 hours 08/20/22 1210     08/19/22 1600  cefTRIAXone (ROCEPHIN) 1 g in sodium chloride 0.9 % 100 mL IVPB        1 g 200 mL/hr over 30 Minutes Intravenous Every 24 hours 08/19/22 0845     08/18/22 1600  cefTRIAXone (ROCEPHIN) 1 g in sodium chloride 0.9 % 100 mL IVPB        1 g 200 mL/hr over 30 Minutes Intravenous  Once 08/18/22 1556 08/18/22 1657      Culture/Microbiology    Component Value Date/Time   SDES  08/18/2022 1507    URINE, CLEAN CATCH Performed at [facility], [address] [place], [place] [address]    [facility]  08/18/2022 1507    NONE Performed at [facility], [address] [place], [place] [address]    CULT >=100,000 COLONIES/mL YEAST (A) 08/18/2022 1507   REPTSTATUS 08/20/2022 FINAL 08/18/2022 1507   Radiology Studies: No results found.   LOS: 3 days   Lanae Boast, MD [facility]  08/21/2022, 12:05 PM

## 2022-08-21 NOTE — Progress Notes (Signed)
Late note In PACU noted to be more somnolent. ABG, cxr done:  shows ph 7.13 with co2 retention. Discussed with anesthesia, seen by covering MD Dr Georgeann Oppenheim discussed with Dr Belia Heman who reviewed ; patient was more awake and not in distress and no work of breathing: advised to cont on progressive bed, cont bipap bedtime prn. Pt reluctant for bipap.

## 2022-08-21 NOTE — TOC Initial Note (Addendum)
Transition of Care [facility]) - Initial/Assessment Note    Patient Details  Name: [name] MRN: [id number] Date of Birth: 1970/11/09  Transition of [facility]) CM/SW Contact:    Allena Katz, LCSW Phone Number: 08/21/2022, 1:40 PM  Clinical Narrative:   Pt admitted with UTI, from home with husband. Pt uses a wheelchair at baseline and can stand pivot to the [facility]. Pt currently active with [facility] for Dialysis Tuesday, Thursday, Saturday.Pt reportedly uses Bipap at night and 4L of oxygen. Pt active with Orson Eva for primary care. Pt currently getting a DNC today.         Patient Goals and CMS Choice            Expected Discharge Plan and Services                                              Prior Living Arrangements/Services                       Activities of Daily Living Home Assistive Devices/Equipment: Cane (specify quad or straight) ADL Screening (condition at time of admission) [name] cognitive ability adequate to safely complete daily activities?: Yes Is the patient deaf or have difficulty hearing?: No Does the patient have difficulty seeing, even when wearing glasses/contacts?: No Does the patient have difficulty concentrating, remembering, or making decisions?: No Patient able to express need for assistance with ADLs?: Yes Does the patient have difficulty dressing or bathing?: No Independently performs ADLs?: Yes (appropriate for developmental age) Does the patient have difficulty walking or climbing stairs?: Yes Weakness of Legs: Both Weakness of Arms/Hands: None  Permission Sought/Granted                  Emotional Assessment              Admission diagnosis:  UTI (urinary tract infection) [N39.0] Patient Active Problem List   Diagnosis Date Noted   UTI (urinary tract infection) 08/18/2022   Allergic rhinitis due to pollen 05/22/2022   Insomnia 04/24/2022   Chronic low back pain 04/24/2022   Right foot  infection 04/19/2022   Osteomyelitis of right foot (HCC) 04/15/2022   (HFpEF) heart failure with preserved ejection fraction (HCC) 04/15/2022   SOB (shortness of breath)    History of osteomyelitis    RUQ pain    Status post thoracentesis    Acute hematogenous osteomyelitis of right foot (HCC)    Pancreatitis 04/21/2021   High anion gap metabolic acidosis 04/21/2021   GERD (gastroesophageal reflux disease) 04/21/2021   Acute on chronic diastolic CHF (congestive heart failure) (HCC) 11/14/2020   Anemia of chronic disease    Hyponatremia    Gangrene (HCC)    PVD (peripheral vascular disease) (HCC)    Aspiration into airway    Hearing loss of right ear    Diabetic foot ulcer (HCC) 10/15/2020   Decreased pedal pulses 10/15/2020   Chronic respiratory failure with hypoxia and hypercapnia (HCC)    Extreme obesity with alveolar hypoventilation (HCC)    Palliative care by specialist    Goals of care, counseling/discussion    ESRD on hemodialysis (HCC)    Adjustment disorder with mixed anxiety and depressed mood 03/31/2017   Dysthymia 03/31/2017   Chronic pain syndrome 03/31/2017   Palliative care encounter    Obesity hypoventilation syndrome (HCC) 03/09/2017  Chronic diastolic heart failure (HCC) 01/07/2017   Depression 09/24/2016   Acute on chronic diastolic heart failure (HCC) 07/04/2016   COPD with chronic bronchitis 07/04/2016   Acute on chronic respiratory failure (HCC) 06/26/2016   CKD (chronic kidney disease), stage III (HCC) 06/26/2016   Chronic obstructive pulmonary disease (HCC) 03/25/2016   Anxiety 10/02/2015   Asthma 08/30/2015   Insulin-requiring or dependent type II diabetes mellitus (HCC) 03/13/2015   Iron deficiency anemia 02/17/2015   Obesity, Class III, BMI 40-49.9 (morbid obesity) (HCC)    Shortness of breath    Hypertension    Hypertensive heart disease with congestive heart failure (HCC) 12/18/2014   Diabetes mellitus with ESRD (end-stage renal disease) (HCC)  12/15/2014   PCP:  Orson Eva, NP Pharmacy:   [facility] [address] ([address]), [place] - [address] [place]) [place] [address] Phone: [phone] Fax: [phone]  [facility] [address] [place] [address] Phone: [phone] Fax: [phone]  [facility] - [place], [place] - [address] [employer] [address] [place] [address] Phone: [phone] Fax: [phone]     Social Determinants of Health (SDOH) Social History: SDOH Screenings   Food Insecurity: No Food Insecurity (08/18/2022)  Housing: Patient Unable To Answer (08/18/2022)  Transportation Needs: No Transportation Needs (08/18/2022)  Utilities: Not At Risk (08/18/2022)  Depression (PHQ2-9): Low Risk  (07/15/2022)  Financial Resource Strain: High Risk (03/24/2017)  Physical Activity: Inactive (03/24/2017)  Social Connections: Somewhat Isolated (03/24/2017)  Stress: No Stress Concern Present (03/24/2017)  Tobacco Use: Low Risk  (07/16/2022)   SDOH Interventions:     Readmission Risk Interventions    08/21/2022    1:40 PM  Readmission Risk Prevention Plan  Transportation Screening Complete  PCP or Specialist Appt within 3-5 Days Complete  HRI or Home Care Consult Complete  Medication Review (RN Care Manager) Complete

## 2022-08-22 ENCOUNTER — Encounter: Payer: Self-pay | Admitting: Obstetrics and Gynecology

## 2022-08-22 DIAGNOSIS — J9621 Acute and chronic respiratory failure with hypoxia: Secondary | ICD-10-CM | POA: Diagnosis not present

## 2022-08-22 DIAGNOSIS — B3749 Other urogenital candidiasis: Secondary | ICD-10-CM | POA: Diagnosis not present

## 2022-08-22 DIAGNOSIS — G4733 Obstructive sleep apnea (adult) (pediatric): Secondary | ICD-10-CM

## 2022-08-22 DIAGNOSIS — E119 Type 2 diabetes mellitus without complications: Secondary | ICD-10-CM

## 2022-08-22 DIAGNOSIS — D509 Iron deficiency anemia, unspecified: Secondary | ICD-10-CM

## 2022-08-22 DIAGNOSIS — N888 Other specified noninflammatory disorders of cervix uteri: Secondary | ICD-10-CM

## 2022-08-22 DIAGNOSIS — N186 End stage renal disease: Secondary | ICD-10-CM | POA: Diagnosis not present

## 2022-08-22 DIAGNOSIS — F419 Anxiety disorder, unspecified: Secondary | ICD-10-CM

## 2022-08-22 DIAGNOSIS — N898 Other specified noninflammatory disorders of vagina: Secondary | ICD-10-CM

## 2022-08-22 DIAGNOSIS — J4489 Other specified chronic obstructive pulmonary disease: Secondary | ICD-10-CM

## 2022-08-22 DIAGNOSIS — G9341 Metabolic encephalopathy: Secondary | ICD-10-CM | POA: Insufficient documentation

## 2022-08-22 DIAGNOSIS — Z794 Long term (current) use of insulin: Secondary | ICD-10-CM

## 2022-08-22 LAB — COMPREHENSIVE METABOLIC PANEL
ALT: 93 U/L — ABNORMAL HIGH (ref 0–44)
AST: 220 U/L — ABNORMAL HIGH (ref 15–41)
Albumin: 3.2 g/dL — ABNORMAL LOW (ref 3.5–5.0)
Alkaline Phosphatase: 650 U/L — ABNORMAL HIGH (ref 38–126)
Anion gap: 18 — ABNORMAL HIGH (ref 5–15)
BUN: 65 mg/dL — ABNORMAL HIGH (ref 6–20)
CO2: 19 mmol/L — ABNORMAL LOW (ref 22–32)
Calcium: 7.7 mg/dL — ABNORMAL LOW (ref 8.9–10.3)
Chloride: 90 mmol/L — ABNORMAL LOW (ref 98–111)
Creatinine, Ser: 6.4 mg/dL — ABNORMAL HIGH (ref 0.44–1.00)
GFR, Estimated: 7 mL/min — ABNORMAL LOW (ref >60)
Glucose, Bld: 87 mg/dL (ref 70–99)
Potassium: 5.6 mmol/L — ABNORMAL HIGH (ref 3.5–5.1)
Sodium: 127 mmol/L — ABNORMAL LOW (ref 135–145)
Total Bilirubin: 3.2 mg/dL — ABNORMAL HIGH (ref 0.3–1.2)
Total Protein: 7.4 g/dL (ref 6.5–8.1)

## 2022-08-22 LAB — GLUCOSE, CAPILLARY
Glucose-Capillary: 115 mg/dL — ABNORMAL HIGH (ref 70–99)
Glucose-Capillary: 134 mg/dL — ABNORMAL HIGH (ref 70–99)
Glucose-Capillary: 190 mg/dL — ABNORMAL HIGH (ref 70–99)
Glucose-Capillary: 65 mg/dL — ABNORMAL LOW (ref 70–99)
Glucose-Capillary: 72 mg/dL (ref 70–99)
Glucose-Capillary: 86 mg/dL (ref 70–99)
Glucose-Capillary: 98 mg/dL (ref 70–99)
Glucose-Capillary: 98 mg/dL (ref 70–99)

## 2022-08-22 LAB — CBC
HCT: 45.1 % (ref 36.0–46.0)
Hemoglobin: 14.1 g/dL (ref 12.0–15.0)
MCH: 30.1 pg (ref 26.0–34.0)
MCHC: 31.3 g/dL (ref 30.0–36.0)
MCV: 96.2 fL (ref 80.0–100.0)
Platelets: 345 10*3/uL (ref 150–400)
RBC: 4.69 MIL/uL (ref 3.87–5.11)
RDW: 19.3 % — ABNORMAL HIGH (ref 11.5–15.5)
WBC: 25 10*3/uL — ABNORMAL HIGH (ref 4.0–10.5)
nRBC: 1.5 % — ABNORMAL HIGH (ref 0.0–0.2)

## 2022-08-22 LAB — WET PREP, GENITAL
Clue Cells Wet Prep HPF POC: NONE SEEN
Sperm: NONE SEEN
WBC, Wet Prep HPF POC: >=10 — AB (ref <10)

## 2022-08-22 MED ORDER — SODIUM CHLORIDE 0.9 % IV SOLN
2.0000 g | INTRAVENOUS | Status: DC
  Administered 2022-08-22 – 2022-08-24 (×3): 2 g via INTRAVENOUS
  Filled 2022-08-22 (×6): qty 20

## 2022-08-22 NOTE — Progress Notes (Signed)
Progress Note   Patient: [name] ZOX:[id number] DOB: 08-11-1970 DOA: 08/18/2022     4 DOS: the patient was seen and examined on 08/22/2022   Brief hospital course: Bralyn Espino. Felix is a 52 y.o. female with medical history significant of  ESRD on HD TTS  with minimal urine out put, Asthma/COPD/OSA/OSH intolerant to bipap, Chronic respiratory failure on home O2 4L,  HTN, Hfpef, anxiety,  DMII, obesity, chronic wound right foot followed by wound care presented to ED with interim hx of dysuria as well as foul odor associated with her urine.Patient has been seen for this as outpatient and was placed on oral antibiotics x 2 weeks and still having symptoms. Patient notes that over the last few days she has had increase lower abdominal pain /pressure with radiation towards her flank area.  UA was noted to be abnormal.  CT scan did not show any acute findings. Labs showed BUN 45 creatinine 5.6 bicarb 22 normal LFTs, leukocytosis at 18.5 A1c 7.8 TSH 2.5 UA abnormal WBC more than 50 leukocyte moderate nitrate negative, urine culture sent and patient admitted on IV antibiotics for failed outpatient treatment  Assessment and Plan: Yeast UTI Vaginal discharge Wet prep done positive for yeast- Patient is started on fluconazole therapy. Gonorrhea, chlamydia negative. Patient is on Rocephin, flagyl, doxy for possible PID.  Enlarged uterus with degenerating fibroid Vaginal discharge- CT scan suggesting enlarged uterus secondary to fibroid.  She does have vaginal discharge.  Status post GYN cervical biopsy.  Continue Flagyl and doxycycline therapy for possible PID.  Metabolic encephalopathy In the setting on infection, co2 narcosis Encourage to be compliant with bipap. Neuro checks. Fall, aspiration precautions. Encourage out of bed, incentive spirometry.  ESRD on hemodialysis- Nephrology on board for dialysis needs.  Chronic right foot wound- Seen by wound care.  Continue dressing changes.  Prior  MRI reviewed with underlying subacute to chronic osteomyelitis of first through fourth metatarsal heads.  Chronic respiratory failure with hypoxia- Obesity hypoventilation syndrome- High CO2 level noted on ABG likely contributing to lethargy. Continue BiPAP at bedtime, 4 L supplemental oxygen as needed.  Anxiety disorder-continue Lexapro.  Chronic hypotension-BP lower side, on midodrine as needed during HD.  Chronic diastolic CHF-euvolemic at this time, fluid management per dialysis.  Type 2 diabetes mellitus- A1c 7.8.  Continue long-acting insulin, sliding scale coverage.  Morbid obesity with BMI 44.2- Diet, exercise and weight loss advised.       Subjective: Patient is seen and examined today morning. Last night she was confused, lethargic refused to wear bipap post OR. Seems confused today but able to answer me appropriately, wants her to be   Physical Exam: Vitals:   08/22/22 1222 08/22/22 1250 08/22/22 1300 08/22/22 1330  BP: (!) 91/54 (!) 83/56 (!) 92/52 (!) 86/52  Pulse: 72 74 76 74  Resp: 15 15 15 15   Temp:      TempSrc:      SpO2: 93% 94% 92% 92%  Weight:      Height:       General - Elderly morbidly obese African-American female, restless in bed HEENT - PERRLA, EOMI, atraumatic head, non tender sinuses. Lung - Clear, diffuse Rales and rhonchi. Heart - S1, S2 heard, no murmurs, rubs, 2+ pedal edema. Abdomen soft, obese, bowel sounds good Neuro - Alert, awake, able to answer me, non focal exam. Skin - Warm and dry. Data Reviewed:  CBC, CMP, blood sugars  Family Communication: patient's husband called over phone to give update but  went to voice mail.  Disposition: Status is: Inpatient Remains inpatient appropriate because: IV antifungal, antibiotic therapy, close neurological, hemodynamic status.  Planned Discharge Destination: Home with Home Health    Time spent: 45 minutes  Author: Marcelino Duster, MD 08/22/2022 2:20 PM  For on call review  [email or web address].

## 2022-08-22 NOTE — Progress Notes (Signed)
  Received patient in bed to unit.   Informed consent signed and in chart.    TX duration:3:12 hrs     Transported by  Hand-off given to patient's nurse. No c/o and no distress noted    Access used: R AVF Access issues: none   Total UF removed: none Medication(s) given: none Post HD VS: 95/60 Post HD weight: 120.2kg     Lynann Beaver  [facility]

## 2022-08-22 NOTE — Progress Notes (Signed)
[facility]  ROUNDING NOTE   Subjective:   [name] is a 52 year old African-American female with a past medical history of end-stage renal disease, patient is on Tuesday Thursday Saturday schedule, diabetes mellitus, history of pancreatitis who came to the ER with chief complaint of abdominal pain with foul smelling urine for 2 weeks.  Patient is known to our clinic and receives outpatient dialysis treatments at [facility] on a TTS schedule, supervised by Dr Thedore Mins.   Patient seen laying flat in bed Drowsy today  Scheduled for dialysis later today  Objective:  Vital signs in last 24 hours:  Temp:  [94 F (34.4 C)-98.7 F (37.1 C)] 97.4 F (36.3 C) (06/27 1124) Pulse Rate:  [63-87] 72 (06/27 1151) Resp:  [13-26] 17 (06/27 1151) BP: (86-147)/(22-131) 90/60 (06/27 1151) SpO2:  [80 %-100 %] 92 % (06/27 1124) FiO2 (%):  [36 %] 36 % (06/26 2317) Weight:  [116.8 kg-121.4 kg] 120.2 kg (06/27 1151)  Weight change: -1.7 kg Filed Weights   08/21/22 1350 08/22/22 0900 08/22/22 1151  Weight: 116.8 kg 121.4 kg 120.2 kg    Intake/Output: I/O last 3 completed shifts: In: -  Out: 20 [Blood:20]   Intake/Output this shift:  Total I/O In: 120 [P.O.:120] Out: -   Physical Exam: General: NAD  Head: Normocephalic, atraumatic. Moist oral mucosal membranes  Eyes: Anicteric  Lungs:  Clear to auscultation, NCO2  Heart: Regular rate and rhythm  Abdomen:  Soft, nontender  Extremities:  No peripheral edema.  Neurologic: Drowsy, arousable, moving all four extremities  Skin: Chronic right foot wound  Access: Lt AVF     Basic Metabolic Panel: Recent Labs  Lab 08/18/22 1211 08/19/22 0326 08/20/22 0536 08/21/22 0503 08/22/22 0507  NA 133* 132* 128* 127* 127*  K 3.9 4.5 5.3* 5.0 5.6*  CL 96* 94* 90* 89* 90*  CO2 22 20* 20* 22 19*  GLUCOSE 208* 160* 94 125* 87  BUN 45* 52* 70* 51* 65*  CREATININE 5.65* 6.25* 7.35* 5.89* 6.40*  CALCIUM 7.1* 7.3* 7.8* 7.8*  7.7*     Liver Function Tests: Recent Labs  Lab 08/18/22 1211 08/19/22 0326 08/20/22 0536 08/21/22 0503 08/22/22 0507  AST 33 36 26 128* 220*  ALT 26 26 23  50* 93*  ALKPHOS 592* 593* 585* 594* 650*  BILITOT 1.3* 1.4* 1.8* 2.5* 3.2*  PROT 7.4 7.8 7.7 7.6 7.4  ALBUMIN 3.2* 3.3* 3.3* 3.4* 3.2*    No results for input(s): "LIPASE", "AMYLASE" in the last 168 hours. No results for input(s): "AMMONIA" in the last 168 hours.  CBC: Recent Labs  Lab 08/18/22 1211 08/19/22 0326 08/20/22 0536 08/21/22 0503 08/22/22 0507  WBC 18.5* 17.3* 21.4* 23.8* 25.0*  NEUTROABS 15.7*  --   --   --   --   HGB 13.1 13.4 13.5 14.0 14.1  HCT 40.6 43.0 43.5 44.6 45.1  MCV 93.1 95.3 94.6 96.1 96.2  PLT 323 273 305 325 345     Cardiac Enzymes: No results for input(s): "CKTOTAL", "CKMB", "CKMBINDEX", "TROPONINI" in the last 168 hours.  BNP: Invalid input(s): "POCBNP"  CBG: Recent Labs  Lab 08/22/22 0010 08/22/22 0414 08/22/22 0900 08/22/22 1038 08/22/22 1113  GLUCAP 86 72 190* 134* 115*     Microbiology: Results for orders placed or performed during the hospital encounter of 08/18/22  Urine Culture     Status: Abnormal   Collection Time: 08/18/22  3:07 PM   Specimen: Urine, Clean Catch  Result Value Ref Range  Status   Specimen Description   Final    URINE, CLEAN CATCH Performed at [facility], [address] [place], [place] [address]    Special Requests   Final    NONE Performed at [facility], [address] [place], [place] [address]    Culture >=100,000 COLONIES/mL YEAST (A)  Final   Report Status 08/20/2022 FINAL  Final  Wet prep, genital     Status: Abnormal   Collection Time: 08/20/22  6:35 PM  Result Value Ref Range Status   Yeast Wet Prep HPF POC NONE SEEN NONE SEEN Final   Trich, Wet Prep NONE SEEN NONE SEEN Final   Clue Cells Wet Prep HPF POC NONE SEEN NONE SEEN Final   WBC, Wet Prep HPF POC >=10 (A) <10 Final   Sperm NONE SEEN  Final     Comment: Performed at [facility], [address] [place], [place] [address]  Chlamydia/NGC rt PCR [facility] only)     Status: None   Collection Time: 08/20/22  6:35 PM   Specimen: Cervical/Vaginal swab; Genital  Result Value Ref Range Status   Specimen source GC/Chlam ENDOCERVICAL  Final   Chlamydia Tr NOT DETECTED NOT DETECTED Final   N gonorrhoeae NOT DETECTED NOT DETECTED Final    Comment: (NOTE) This CT/NG assay has not been evaluated in patients with a history of  hysterectomy. Performed at [facility], [address] [place], [place] [address]     Coagulation Studies: No results for input(s): "LABPROT", "INR" in the last 72 hours.  Urinalysis: No results for input(s): "COLORURINE", "LABSPEC", "PHURINE", "GLUCOSEU", "HGBUR", "BILIRUBINUR", "KETONESUR", "PROTEINUR", "UROBILINOGEN", "NITRITE", "LEUKOCYTESUR" in the last 72 hours.  Invalid input(s): "APPERANCEUR"     Imaging: DG Chest Port 1 View  Result Date: 08/21/2022 CLINICAL DATA:  Hypoxia. EXAM: PORTABLE CHEST 1 VIEW COMPARISON:  Chest radiograph dated 08/18/2022. FINDINGS: Shallow inspiration. There is mild cardiomegaly with mild central vascular congestion. There is blunting of the right costophrenic angle which may represent trace effusion. Right lung base atelectasis or infiltrate. No pneumothorax. No acute osseous pathology. IMPRESSION: 1. Mild cardiomegaly with mild central vascular congestion. 2. Right lung base atelectasis or infiltrate. Electronically Signed   By: Elgie Collard M.D.   On: 08/21/2022 17:21     Medications:    anticoagulant sodium citrate     cefTRIAXone (ROCEPHIN)  IV 2 g (08/22/22 1042)   doxycycline (VIBRAMYCIN) IV 100 mg (08/22/22 2956)   metronidazole 500 mg (08/22/22 0517)    arformoterol  15 mcg Nebulization Q12H   aspirin EC  81 mg Oral Daily   budesonide  0.5 mg Nebulization BID   Chlorhexidine Gluconate Cloth  6 each Topical Q0600   cholecalciferol  1,000  Units Oral Daily   escitalopram  10 mg Oral Daily   fluconazole  100 mg Oral Daily   insulin aspart  0-9 Units Subcutaneous Q4H   insulin glargine-yfgn  15 Units Subcutaneous Daily   montelukast  10 mg Oral QHS   pantoprazole  40 mg Oral Daily   sevelamer carbonate  2.4 g Oral TID WC   acetaminophen, albuterol, alteplase, anticoagulant sodium citrate, fentaNYL (SUBLIMAZE) injection, heparin, lidocaine (PF), lidocaine-prilocaine, midodrine, ondansetron **OR** ondansetron (ZOFRAN) IV, oxybutynin, oxyCODONE, pentafluoroprop-tetrafluoroeth, pregabalin  Assessment/ Plan:  [name] is a 52 y.o.  female with a past medical history of end-stage renal disease, patient is on Tuesday Thursday Saturday schedule, diabetes mellitus, history of pancreatitis who came to the ER  with chief complaint of abdominal pain with foul smelling urine for 2 weeks.  [facility] TTS Left AVF 127.5kg   End stage renal disease on hemodialysis.  Dialysis scheduled for later today, Next treatment scheduled for Saturday  2. Anemia of chronic kidney disease Lab Results  Component Value Date   HGB 14.1 08/22/2022    Hemoglobin remains stable, no need for ESA  3. Secondary Hyperparathyroidism: with outpatient labs: PTH 2361, phosphorus 9.0, calcium 8.4 on 08/06/22.   Lab Results  Component Value Date   PTH 134 (H) 04/21/2017   CALCIUM 7.7 (L) 08/22/2022   CAION 0.81 (LL) 10/26/2020   PHOS 8.2 (H) 04/20/2022    Patient prescribed cholecalciferol outpatient.  Continue cholecalciferol and sevelamer with meals  4. Diabetes mellitus type II with chronic kidney disease/renal manifestations: noninsulin dependent. Most recent hemoglobin A1c is 7.8 on 08/18/22.   Primary team to manage sliding scale insulin   LOS: 4 Delmus Warwick 6/27/202411:57 AM

## 2022-08-22 NOTE — Progress Notes (Signed)
Pt found asleep on 4 lpm [place]. Pt aroused for neb treatment and then educated and encouraged to use BIPAP at this time and tonight. Device setup bedside and ready for use. Pt adamantly refusing and states "Ive told multiple people NO now. I use it at home!" Device remains standby and pt returned to 4 lpm [place] post nebulizer treatments. Nurse messaged so she is aware as well.

## 2022-08-22 NOTE — Consult Note (Signed)
[facility] CONSULT NOTE  Patient Care Team: Orson Eva, NP as PCP - General (Nurse Practitioner) Antonieta Iba, MD as Consulting Physician (Cardiology) Delma Freeze, FNP as Nurse Practitioner (Family Medicine) Salena Saner, MD as Consulting Physician (Pulmonary Disease)  CHIEF COMPLAINTS/PURPOSE OF CONSULTATION: Cervical lesion concerning for cervical cancer  HISTORY OF PRESENTING ILLNESS:  [name] 52 y.o.  female patient with multiple medical problems which are significant of ESRD on HD T/thurs/Sat ,  with minimal urine out put, Asthma/COPD/OSA/OSH intolerant to bipap,Chronic respiratory failure on home O2 4L,  HTN, Hfpef, anxiety,  DMII, morbid obesity ,chronic wound right foot followed by wound cared.   Patient currently admitted for UTI.  Given worsening abdominal pain patient had a CT scan of the abdomen that showed uterine mass.  This was further followed by an ultrasound that showed possible uterine mass.  Further evaluated by Gynecology; exam under anesthesia/biopsy showed- "purulent vaginal discharge with necrotic cervical tissue noted.  Multiple cervical biopsies performed".  Currently biopsy is pending.  Patient currently awaiting biopsy results.  Oncology has been consulted for further evaluation recommendations.  Review of Systems  Constitutional:  Negative for chills, diaphoresis, fever, malaise/fatigue and weight loss.  HENT:  Negative for nosebleeds and sore throat.   Eyes:  Negative for double vision.  Respiratory:  Negative for cough, hemoptysis, sputum production, shortness of breath and wheezing.   Cardiovascular:  Positive for leg swelling. Negative for chest pain, palpitations and orthopnea.  Gastrointestinal:  Positive for abdominal pain. Negative for blood in stool, constipation, diarrhea, heartburn, melena, nausea and vomiting.  Genitourinary:  Negative for dysuria, frequency and urgency.  Musculoskeletal:  Positive for back  pain and joint pain.  Skin:  Positive for itching and rash.  Neurological:  Negative for dizziness, tingling, focal weakness, weakness and headaches.  Endo/Heme/Allergies:  Does not bruise/bleed easily.  Psychiatric/Behavioral:  Negative for depression. The patient is not nervous/anxious and does not have insomnia.     MEDICAL HISTORY:  Past Medical History:  Diagnosis Date   (HFpEF) heart failure with preserved ejection fraction (HCC)    a. 11/2014 Echo: EF 50-55%, Gr1 DD; b. 06/2016 Echo: EF 60-65%, no rwma, mod dil LA, nl RV, triv eff.   Anxiety    Asthma    Bronchitis    CHF (congestive heart failure) (HCC)    Chronic cough    Chronic respiratory failure with hypoxia and hypercapnia (HCC)    a. Home O2.  (2-4L)   CKD (chronic kidney disease), stage III (HCC)    Depression    Diabetes mellitus without complication (HCC)    Diverticulosis    Dyspnea    Dysrhythmia    per pt, no arrythmias   Environmental and seasonal allergies    Extreme obesity with alveolar hypoventilation (HCC)    Hypertension    Iron deficiency anemia    a. chronic blood loss->heavy menses.   Kidney failure    dialysis tues/thur/sat   ... left arm shunt   Orthopnea    Oxygen dependent    3l/min   Pericardial effusion    a. 11/2014 CT Chest: Mod effusion; b. 11/2014 Echo: small to mod circumferential effusion. No hemodynamic compromise.   Pneumonia    history   Poorly controlled diabetes mellitus (HCC)    a. 11/2014 A1c 13.2.   Swelling of both lower extremities     SURGICAL HISTORY: Past Surgical History:  Procedure Laterality Date   A/V FISTULAGRAM Left 06/09/2017  Procedure: A/V FISTULAGRAM;  Surgeon: Annice Needy, MD;  Location: [facility];  Service: Cardiovascular;  Laterality: Left;   A/V FISTULAGRAM Left 06/16/2017   Procedure: A/V FISTULAGRAM;  Surgeon: Annice Needy, MD;  Location: [facility];  Service: Cardiovascular;  Laterality: Left;   A/V FISTULAGRAM Left  01/28/2018   Procedure: A/V FISTULAGRAM;  Surgeon: Annice Needy, MD;  Location: [facility];  Service: Cardiovascular;  Laterality: Left;   AMPUTATION TOE Right 10/21/2020   Procedure: AMPUTATION TOE-5th Digit Amputation Right Foot;  Surgeon: Felecia Shelling, DPM;  Location: [facility];  Service: Podiatry;  Laterality: Right;   AV FISTULA PLACEMENT Left 04/25/2017   Procedure: ARTERIOVENOUS (AV) FISTULA CREATION;  Surgeon: Annice Needy, MD;  Location: [facility];  Service: Vascular;  Laterality: Left;   BONE BIOPSY Right 04/27/2021   Procedure: Right 4th metatarsal BONE BIOPSY;  Surgeon: Candelaria Stagers, DPM;  Location: [facility];  Service: Podiatry;  Laterality: Right;   CATARACT EXTRACTION W/PHACO Left 01/21/2018   Procedure: CATARACT EXTRACTION PHACO AND INTRAOCULAR LENS PLACEMENT (IOC);  Surgeon: Lockie Mola, MD;  Location: [facility];  Service: Ophthalmology;  Laterality: Left;  [place] 01:55.5 CDE 9.05 EAUP 13.9 Fluid Pack Lot # [id number]   CERVICAL CONIZATION W/BX N/A 08/21/2022   Procedure: CERVICAL BIOPSY;  Surgeon: Feliberto Gottron Ihor Austin, MD;  Location: [facility];  Service: Gynecology;  Laterality: N/A;   CESAREAN SECTION     times 3   DIALYSIS/PERMA CATHETER INSERTION Right 04/04/2017   Procedure: DIALYSIS/PERMA CATHETER INSERTION;  Surgeon: Renford Dills, MD;  Location: [facility];  Service: Cardiovascular;  Laterality: Right;   DIALYSIS/PERMA CATHETER REMOVAL N/A 09/17/2017   Procedure: DIALYSIS/PERMA CATHETER REMOVAL;  Surgeon: Annice Needy, MD;  Location: [facility];  Service: Cardiovascular;  Laterality: N/A;   INCISION AND DRAINAGE Right 10/26/2020   Procedure: INCISION AND DRAINAGE;  Surgeon: Candelaria Stagers, DPM;  Location: [facility];  Service: Podiatry;  Laterality: Right;   INSERTION OF DIALYSIS CATHETER N/A 04/16/2017   Procedure: INSERTION OF DIALYSIS PERM CATHETER;  Surgeon: Annice Needy, MD;  Location: [facility];  Service: Vascular;  Laterality: N/A;    LOWER EXTREMITY ANGIOGRAPHY Right 10/18/2020   Procedure: Lower Extremity Angiography;  Surgeon: Annice Needy, MD;  Location: [facility];  Service: Cardiovascular;  Laterality: Right;   LOWER EXTREMITY ANGIOGRAPHY Right 04/17/2022   Procedure: Lower Extremity Angiography;  Surgeon: Annice Needy, MD;  Location: [facility];  Service: Cardiovascular;  Laterality: Right;    SOCIAL HISTORY: Social History   Socioeconomic History   Marital status: Married    Spouse name: abjul   Number of children: Not on file   Years of education: 12   Highest education level: High school graduate  Occupational History   Occupation: Disabled  Tobacco Use   Smoking status: Never    Passive exposure: Past   Smokeless tobacco: Never  Vaping Use   Vaping Use: Never used  Substance and Sexual Activity   Alcohol use: Not Currently    Comment: occasional   Drug use: Yes    Types: Marijuana   Sexual activity: Not Currently    Birth control/protection: None  Other Topics Concern   Not on file  Social History Narrative   Lives in [place] with her husband and 58 y/o child.  Three other children are grown and out of the house.  She is sedentary and does not  routinely exercise.  On home O2.   Social Determinants of Health   Financial Resource Strain: High Risk (03/24/2017)   Overall Financial Resource Strain (CARDIA)    Difficulty of Paying Living Expenses: Very hard  Food Insecurity: No Food Insecurity (08/18/2022)   Hunger Vital Sign    Worried About Running Out of Food in the Last Year: Never true    Ran Out of Food in the Last Year: Never true  Transportation Needs: No Transportation Needs (08/18/2022)   PRAPARE - Administrator, Civil Service (Medical): No    Lack of Transportation (Non-Medical): No  Physical Activity: Inactive (03/24/2017)   Exercise Vital Sign    Days of Exercise per Week: 0 days    Minutes of Exercise per Session: 0 min  Stress: No Stress Concern  Present (03/24/2017)   [employer] of Occupational Health - Occupational Stress Questionnaire    Feeling of Stress : Not at all  Social Connections: Somewhat Isolated (03/24/2017)   Social Connection and Isolation Panel [NHANES]    Frequency of Communication with Friends and Family: More than three times a week    Frequency of Social Gatherings with Friends and Family: Once a week    Attends Religious Services: Never    Database administrator or Organizations: No    Attends Banker Meetings: Never    Marital Status: Married  Catering manager Violence: Not At Risk (08/18/2022)   Humiliation, Afraid, Rape, and Kick questionnaire    Fear of Current or Ex-Partner: No    Emotionally Abused: No    Physically Abused: No    Sexually Abused: No    FAMILY HISTORY: Family History  Problem Relation Age of Onset   Diabetes Mother    Hypertension Mother    Hypertension Father    Diabetes Father    Breast cancer Maternal Aunt 50   Breast cancer Maternal Aunt 50   Hypertension Other    Diabetes Other     ALLERGIES:  is allergic to dust mite extract, mold extract [trichophyton], pollen extract, ozempic (0.25 or 0.5 mg-dose) [semaglutide(0.25 or 0.5mg -dos)], sulfa antibiotics, and feraheme [ferumoxytol].  MEDICATIONS:  Current Facility-Administered Medications  Medication Dose Route Frequency Provider Last Rate Last Admin   acetaminophen (TYLENOL) tablet 1,000 mg  1,000 mg Oral Q6H PRN Skip Mayer A, MD       albuterol (PROVENTIL) (2.5 MG/3ML) 0.083% nebulizer solution 2.5 mg  2.5 mg Nebulization Q6H PRN Skip Mayer A, MD   2.5 mg at 08/21/22 1448   alteplase (CATHFLO ACTIVASE) injection 2 mg  2 mg Intracatheter Once PRN Wendee Beavers, NP       anticoagulant sodium citrate solution 5 mL  5 mL Intracatheter PRN Wendee Beavers, NP       arformoterol (BROVANA) nebulizer solution 15 mcg  15 mcg Nebulization Q12H Skip Mayer A, MD   15 mcg at 08/22/22 2037    aspirin EC tablet 81 mg  81 mg Oral Daily Skip Mayer A, MD   81 mg at 08/22/22 1039   budesonide (PULMICORT) nebulizer solution 0.5 mg  0.5 mg Nebulization BID Skip Mayer A, MD   0.5 mg at 08/22/22 2037   cefTRIAXone (ROCEPHIN) 2 g in sodium chloride 0.9 % 100 mL IVPB  2 g Intravenous Q24H Ronnald Ramp, RPH 200 mL/hr at 08/22/22 1042 2 g at 08/22/22 1042   Chlorhexidine Gluconate Cloth 2 % PADS 6 each  6 each Topical Q0600 Wendee Beavers, NP  6 each at 08/22/22 0541   cholecalciferol (VITAMIN D3) 25 MCG (1000 UNIT) tablet 1,000 Units  1,000 Units Oral Daily Skip Mayer A, MD   1,000 Units at 08/22/22 1040   doxycycline (VIBRAMYCIN) 100 mg in sodium chloride 0.9 % 250 mL IVPB  100 mg Intravenous Q12H Osvaldo Shipper, MD 125 mL/hr at 08/22/22 0621 100 mg at 08/22/22 0621   escitalopram (LEXAPRO) tablet 10 mg  10 mg Oral Daily Skip Mayer A, MD   10 mg at 08/19/22 0829   fentaNYL (SUBLIMAZE) injection 25 mcg  25 mcg Intravenous Q2H PRN Osvaldo Shipper, MD   25 mcg at 08/19/22 1610   fluconazole (DIFLUCAN) tablet 100 mg  100 mg Oral Daily Jaynie Bream, RPH       heparin injection 1,000 Units  1,000 Units Intracatheter PRN Wendee Beavers, NP       insulin aspart (novoLOG) injection 0-9 Units  0-9 Units Subcutaneous Q4H Andris Baumann, MD   1 Units at 08/22/22 1049   insulin glargine-yfgn (SEMGLEE) injection 15 Units  15 Units Subcutaneous Daily Lindajo Royal V, MD   15 Units at 08/22/22 1040   lidocaine (PF) (XYLOCAINE) 1 % injection 5 mL  5 mL Intradermal PRN Wendee Beavers, NP       lidocaine-prilocaine (EMLA) cream   Topical PRN Lurline Del, MD       metroNIDAZOLE (FLAGYL) IVPB 500 mg  500 mg Intravenous Q12H Osvaldo Shipper, MD 100 mL/hr at 08/22/22 1758 500 mg at 08/22/22 1758   midodrine (PROAMATINE) tablet 20 mg  20 mg Oral BID PRN Andris Baumann, MD   20 mg at 08/22/22 2202   montelukast (SINGULAIR) tablet 10 mg  10 mg Oral QHS Skip Mayer A, MD   10 mg at 08/22/22 2200   ondansetron (ZOFRAN) tablet 4 mg  4 mg Oral Q6H PRN Lurline Del, MD       Or   ondansetron [facility]) injection 4 mg  4 mg Intravenous Q6H PRN Skip Mayer A, MD   4 mg at 08/21/22 1514   oxybutynin (DITROPAN) tablet 5 mg  5 mg Oral Q8H PRN Lurline Del, MD       oxyCODONE (Oxy IR/ROXICODONE) immediate release tablet 5 mg  5 mg Oral Q4H PRN Osvaldo Shipper, MD   5 mg at 08/22/22 2200   pantoprazole (PROTONIX) EC tablet 40 mg  40 mg Oral Daily Skip Mayer A, MD   40 mg at 08/21/22 9604   pentafluoroprop-tetrafluoroeth (GEBAUERS) aerosol 1 Application  1 Application Topical PRN Wendee Beavers, NP       pregabalin (LYRICA) capsule 50 mg  50 mg Oral TID PRN Skip Mayer A, MD   50 mg at 08/22/22 2200   sevelamer carbonate (RENVELA) powder PACK 2.4 g  2.4 g Oral TID WC Skip Mayer A, MD   2.4 g at 08/20/22 1621    PHYSICAL EXAMINATION:   Vitals:   08/22/22 1934 08/22/22 2037  BP: (!) 82/31   Pulse: 73   Resp: 18   Temp: 97.6 F (36.4 C)   SpO2: 93% 94%   Filed Weights   08/22/22 0900 08/22/22 1151 08/22/22 1655  Weight: 267 lb 9.6 oz (121.4 kg) 264 lb 15.9 oz (120.2 kg) 264 lb 15.9 oz (120.2 kg)   Maculopapular rash noted bilateral lower extremities upper extremities chronic as per patient.  Physical Exam Vitals and nursing note reviewed.  HENT:     Head: Normocephalic and atraumatic.  Mouth/Throat:     Pharynx: Oropharynx is clear.  Eyes:     Extraocular Movements: Extraocular movements intact.     Pupils: Pupils are equal, round, and reactive to light.  Cardiovascular:     Rate and Rhythm: Normal rate and regular rhythm.  Pulmonary:     Comments: Decreased breath sounds bilaterally.  Abdominal:     Palpations: Abdomen is soft.  Musculoskeletal:        General: Normal range of motion.     Cervical back: Normal range of motion.  Skin:    General: Skin is warm.  Neurological:     General:  No focal deficit present.     Mental Status: She is alert and oriented to person, place, and time.  Psychiatric:        Behavior: Behavior normal.        Judgment: Judgment normal.     LABORATORY DATA:  I have reviewed the data as listed Lab Results  Component Value Date   WBC 25.0 (H) 08/22/2022   HGB 14.1 08/22/2022   HCT 45.1 08/22/2022   MCV 96.2 08/22/2022   PLT 345 08/22/2022   Recent Labs    08/20/22 0536 08/21/22 0503 08/22/22 0507  NA 128* 127* 127*  K 5.3* 5.0 5.6*  CL 90* 89* 90*  CO2 20* 22 19*  GLUCOSE 94 125* 87  BUN 70* 51* 65*  CREATININE 7.35* 5.89* 6.40*  CALCIUM 7.8* 7.8* 7.7*  GFRNONAA 6* 8* 7*  PROT 7.7 7.6 7.4  ALBUMIN 3.3* 3.4* 3.2*  AST 26 128* 220*  ALT 23 50* 93*  ALKPHOS 585* 594* 650*  BILITOT 1.8* 2.5* 3.2*    RADIOGRAPHIC STUDIES: I have personally reviewed the radiological images as listed and agreed with the findings in the report. DG Chest Port 1 View  Result Date: 08/21/2022 CLINICAL DATA:  Hypoxia. EXAM: PORTABLE CHEST 1 VIEW COMPARISON:  Chest radiograph dated 08/18/2022. FINDINGS: Shallow inspiration. There is mild cardiomegaly with mild central vascular congestion. There is blunting of the right costophrenic angle which may represent trace effusion. Right lung base atelectasis or infiltrate. No pneumothorax. No acute osseous pathology. IMPRESSION: 1. Mild cardiomegaly with mild central vascular congestion. 2. Right lung base atelectasis or infiltrate. Electronically Signed   By: Elgie Collard M.D.   On: 08/21/2022 17:21   US PELVIS (TRANSABDOMINAL ONLY)  Result Date: 08/19/2022 CLINICAL DATA:  Fibroid uterus EXAM: TRANSABDOMINAL ULTRASOUND OF PELVIS TECHNIQUE: Transabdominal ultrasound examination of the pelvis was performed including evaluation of the uterus, ovaries, adnexal regions, and pelvic cul-de-sac. COMPARISON:  CT scan 08/18/2018 FINDINGS: Uterus Measurements: 9.1 x 6.6 x 9.5 cm = volume: 294 mL. The uterus is  diffusely heterogeneous. Of note the images are limited due to contracted urinary bladder overlapping bowel gas and soft tissue. Endometrium Thickness: Not seen.  Obscured Right ovary Measurements: Not Seen. Left ovary Measurements: Not seen. Other findings:  No free fluid. IMPRESSION: Extremely limited examination. Heterogeneous uterus consistent with multiple areas of calcification. These could be vascular. Underlying mass lesion or fibroid is not excluded. The ovaries and endometrium are also obscured. Overall additional cross-sectional study as clinically directed for further delineation Electronically Signed   By: Karen Kays M.D.   On: 08/19/2022 10:59   US Abdomen Limited RUQ (LIVER/GB)  Result Date: 08/18/2022 CLINICAL DATA:  Abnormal liver function tests EXAM: ULTRASOUND ABDOMEN LIMITED RIGHT UPPER QUADRANT COMPARISON:  CT abdomen and pelvis dated 08/18/2022 FINDINGS: Gallbladder: No gallstones or wall thickening visualized. No sonographic Eulah Pont  sign noted by sonographer. Trace pericholecystic free fluid. Common bile duct: Diameter: 3 mm Liver: No focal lesion identified. Subjective diffuse periportal echogenicity. Portal vein is patent on color Doppler imaging with normal direction of blood flow towards the liver. Other: None. IMPRESSION: 1. Subjective diffuse periportal echogenicity, which is nonspecific but can be seen in the setting of hepatitis. 2. Trace pericholecystic free fluid, nonspecific. No sonographic evidence of acute cholecystitis. Electronically Signed   By: Agustin Cree M.D.   On: 08/18/2022 21:04   X-ray chest PA and lateral  Result Date: 08/18/2022 CLINICAL DATA:  Leukocytosis and shortness of breath. EXAM: CHEST - 2 VIEW COMPARISON:  Radiographs 04/24/2022 FINDINGS: Stable cardiomediastinal silhouette. Low lung volumes. Bibasilar atelectasis or infiltrates. No pleural effusion or pneumothorax. IMPRESSION: Bibasilar atelectasis or infiltrates. Electronically Signed   By: Minerva Fester M.D.   On: 08/18/2022 19:02   CT ABDOMEN PELVIS WO CONTRAST  Result Date: 08/18/2022 CLINICAL DATA:  Right lower quadrant abdominal pain EXAM: CT ABDOMEN AND PELVIS WITHOUT CONTRAST TECHNIQUE: Multidetector CT imaging of the abdomen and pelvis was performed following the standard protocol without IV contrast. RADIATION DOSE REDUCTION: This exam was performed according to the departmental dose-optimization program which includes automated exposure control, adjustment of the mA and/or kV according to patient size and/or use of iterative reconstruction technique. COMPARISON:  CT examination dated April 24, 2021 FINDINGS: Lower chest: Subsegmental atelectasis in the right lower lobe. No acute abnormality Hepatobiliary: No focal liver abnormality is seen. No gallstones, gallbladder wall thickening, or biliary dilatation. Pancreas: Unremarkable. No pancreatic ductal dilatation or surrounding inflammatory changes. Spleen: Normal in size without focal abnormality. Adrenals/Urinary Tract: Adrenal glands are unremarkable. Kidneys are normal, without renal calculi, focal lesion, or hydronephrosis. Bladder is unremarkable. Stomach/Bowel: Stomach is within normal limits. Appendix appears normal. No evidence of bowel wall thickening, distention, or inflammatory changes. Vascular/Lymphatic: Moderate aortic atherosclerosis. No enlarged abdominal or pelvic lymph nodes. Reproductive: Enlarged uterus with degenerated fibroid with calcification. Bilateral adnexa are unremarkable. Other: No abdominal wall hernia or abnormality. No abdominopelvic ascites. Musculoskeletal: No acute or significant osseous findings. IMPRESSION: 1. No CT evidence of acute abdominal/pelvic process. Normal appendix. 2. Enlarged uterus with degenerated fibroid with calcification. 3. Moderate aortic atherosclerosis. Electronically Signed   By: Larose Hires D.O.   On: 08/18/2022 14:38     No problem-specific Assessment & Plan notes found for  this encounter.  52 y.o.  female patient with multiple medical problems which are significant of ESRD on HD T/thurs/Sat , Chronic respiratory failure on home O2 4L [COPD OSA CHF], poorly controlled DMII, morbid obesity-currently admitted to hospital for UTI.  Patient noted to have possible cervical cancer  # Clinical concerns of cervical cancer-currently s/p biopsy.  # Multiple medical problems including chronic respiratory failure-  # Poorly controlled diabetes; multiple complications including nonhealing wound/ulcers  # Morbid obesity  Recommendations/plan:  # Await cervical biopsy.  # Recommend outpatient PET scan for further staging purposes.  # Based on staging PET scan if locally advanced- patient would benefit from chemotherapy/immunotherapy- radiation.  However given patient's multiple comorbidities-alternative chemotherapy options including-carboplatin/Taxol should be considered.   # Thank you for allowing me to participate in the care of your pleasant patient. Please do not hesitate to contact me with questions or concerns in the interim.  The above plan of care was discussed with patient's daughter over the phone.  Also discussed with gynecology Dr. Feliberto Gottron.  Once clinically stable patient will be discharged.  Will make appointment with  gynecology oncology-on outpatient basis.   Above plan of care was discussed with patient/family in detail.  My contact information was given to the patient/family. I have informed the gynecology oncology nurse navigator; and Consuello Masse, NP.     Earna Coder, MD 08/22/2022 10:20 PM

## 2022-08-23 ENCOUNTER — Ambulatory Visit: Payer: 59 | Admitting: Physician Assistant

## 2022-08-23 DIAGNOSIS — E119 Type 2 diabetes mellitus without complications: Secondary | ICD-10-CM | POA: Diagnosis not present

## 2022-08-23 DIAGNOSIS — B3749 Other urogenital candidiasis: Secondary | ICD-10-CM | POA: Diagnosis not present

## 2022-08-23 DIAGNOSIS — J9621 Acute and chronic respiratory failure with hypoxia: Secondary | ICD-10-CM | POA: Diagnosis not present

## 2022-08-23 DIAGNOSIS — N186 End stage renal disease: Secondary | ICD-10-CM | POA: Diagnosis not present

## 2022-08-23 DIAGNOSIS — N888 Other specified noninflammatory disorders of cervix uteri: Secondary | ICD-10-CM | POA: Diagnosis not present

## 2022-08-23 LAB — COMPREHENSIVE METABOLIC PANEL
ALT: 97 U/L — ABNORMAL HIGH (ref 0–44)
AST: 279 U/L — ABNORMAL HIGH (ref 15–41)
Albumin: 3 g/dL — ABNORMAL LOW (ref 3.5–5.0)
Alkaline Phosphatase: 648 U/L — ABNORMAL HIGH (ref 38–126)
Anion gap: 15 (ref 5–15)
BUN: 37 mg/dL — ABNORMAL HIGH (ref 6–20)
CO2: 18 mmol/L — ABNORMAL LOW (ref 22–32)
Calcium: 6.3 mg/dL — CL (ref 8.9–10.3)
Chloride: 101 mmol/L (ref 98–111)
Creatinine, Ser: 3.74 mg/dL — ABNORMAL HIGH (ref 0.44–1.00)
GFR, Estimated: 14 mL/min — ABNORMAL LOW (ref >60)
Glucose, Bld: 67 mg/dL — ABNORMAL LOW (ref 70–99)
Potassium: 3.9 mmol/L (ref 3.5–5.1)
Sodium: 133 mmol/L — ABNORMAL LOW (ref 135–145)
Total Bilirubin: 2.8 mg/dL — ABNORMAL HIGH (ref 0.3–1.2)
Total Protein: 6.9 g/dL (ref 6.5–8.1)

## 2022-08-23 LAB — CBC
HCT: 42 % (ref 36.0–46.0)
Hemoglobin: 13.5 g/dL (ref 12.0–15.0)
MCH: 29.9 pg (ref 26.0–34.0)
MCHC: 32.1 g/dL (ref 30.0–36.0)
MCV: 93.1 fL (ref 80.0–100.0)
Platelets: 300 10*3/uL (ref 150–400)
RBC: 4.51 MIL/uL (ref 3.87–5.11)
RDW: 19.7 % — ABNORMAL HIGH (ref 11.5–15.5)
WBC: 21.7 10*3/uL — ABNORMAL HIGH (ref 4.0–10.5)
nRBC: 2.7 % — ABNORMAL HIGH (ref 0.0–0.2)

## 2022-08-23 LAB — GLUCOSE, CAPILLARY
Glucose-Capillary: 86 mg/dL (ref 70–99)
Glucose-Capillary: 82 mg/dL (ref 70–99)
Glucose-Capillary: 71 mg/dL (ref 70–99)
Glucose-Capillary: 73 mg/dL (ref 70–99)
Glucose-Capillary: 114 mg/dL — ABNORMAL HIGH (ref 70–99)

## 2022-08-23 MED ORDER — DOXYCYCLINE HYCLATE 100 MG PO TABS
100.0000 mg | ORAL_TABLET | Freq: Two times a day (BID) | ORAL | Status: DC
  Administered 2022-08-23 – 2022-08-26 (×7): 100 mg via ORAL
  Filled 2022-08-23 (×7): qty 1

## 2022-08-23 MED ORDER — METRONIDAZOLE 500 MG PO TABS
500.0000 mg | ORAL_TABLET | Freq: Two times a day (BID) | ORAL | Status: DC
  Administered 2022-08-23 – 2022-08-26 (×7): 500 mg via ORAL
  Filled 2022-08-23 (×8): qty 1

## 2022-08-23 MED ORDER — BISACODYL 10 MG RE SUPP: 10.0000 mg | Freq: Every day | RECTAL | Status: DC | PRN

## 2022-08-23 MED ORDER — INSULIN GLARGINE-YFGN 100 UNIT/ML ~~LOC~~ SOLN
10.0000 [IU] | Freq: Every day | SUBCUTANEOUS | Status: DC
  Filled 2022-08-23 (×3): qty 0.1

## 2022-08-23 MED ORDER — CALCIUM GLUCONATE-NACL 2-0.675 GM/100ML-% IV SOLN
2.0000 g | Freq: Once | INTRAVENOUS | Status: AC
  Administered 2022-08-23: 2000 mg via INTRAVENOUS
  Filled 2022-08-23: qty 100

## 2022-08-23 NOTE — Progress Notes (Signed)
Progress Note   Patient: [name] ZOX:[id number] DOB: 03/02/1970 DOA: 08/18/2022     5 DOS: the patient was seen and examined on 08/23/2022   Brief hospital course: Rikayla Groah. Guel is a 52 y.o. female with medical history significant of  ESRD on HD TTS  with minimal urine out put, Asthma/COPD/OSA/OSH intolerant to bipap, Chronic respiratory failure on home O2 4L,  HTN, Hfpef, anxiety,  DMII, obesity, chronic wound right foot followed by wound care presented to ED with interim hx of dysuria as well as foul odor associated with her urine.Patient has been seen for this as outpatient and was placed on oral antibiotics x 2 weeks and still having symptoms. Patient notes that over the last few days she has had increase lower abdominal pain /pressure with radiation towards her flank area.  UA was noted to be abnormal.  CT scan did not show any acute findings. Labs showed BUN 45 creatinine 5.6 bicarb 22 normal LFTs, leukocytosis at 18.5 A1c 7.8 TSH 2.5 UA abnormal WBC more than 50 leukocyte moderate nitrate negative, urine culture sent and patient admitted on IV antibiotics for failed outpatient treatment  Assessment and Plan: Yeast UTI Vaginal discharge Wet prep done positive for yeast- Patient is started on fluconazole therapy. Gonorrhea, chlamydia negative. Patient is on Rocephin, flagyl, doxy for possible PID.  Enlarged uterus with degenerating fibroid Vaginal discharge- CT scan suggesting enlarged uterus secondary to fibroid.  She does have vaginal discharge.  Continue Flagyl and doxycycline therapy for possible PID. Oncology team suggested outpatient PET scan. Cervical biopsy pending.  Metabolic encephalopathy In the setting on infection, co2 narcosis Encourage to be compliant with bipap.  Neuro checks. Fall, aspiration precautions. Encourage out of bed, incentive spirometry.  ESRD on hemodialysis- Nephrology on board for dialysis needs.  Chronic right foot wound- Prior MRI  reviewed with underlying subacute to chronic osteomyelitis of first through fourth metatarsal heads. Seen by wound care.  Continue dressing changes.   Chronic respiratory failure with hypoxia- Obesity hypoventilation syndrome- High CO2 level noted on ABG likely contributing to lethargy. Continue BiPAP at bedtime, 4 L supplemental oxygen as needed. Left VM with husband to bring her PAP device.  Anxiety disorder-continue Lexapro.  Chronic hypotension-BP lower side, on midodrine as needed during HD.  Chronic diastolic CHF-euvolemic at this time, fluid management per dialysis.  Type 2 diabetes mellitus- A1c 7.8.  eating poor, low sugars noted. Hold long-acting insulin if persistent low sugars, continue sliding scale coverage for now.  Morbid obesity with BMI 44.2- Diet, exercise and weight loss advised.  DVT prophylaxis- Heparin. CODE STATUS - FULL.      Subjective: Patient is seen and examined today morning. Seems confused but no agitation. RN notified she had a fall, no injuries, slid from bedside commode. Able to answer me appropriately. Eating poor, sugars low.  Physical Exam: Vitals:   08/23/22 0818 08/23/22 0834 08/23/22 1227 08/23/22 1316  BP: (!) 66/53 (!) 91/56 (!) 58/47 (!) 129/105  Pulse: (!) 125 68 72 (!) 106  Resp: 20  16   Temp: 98.1 F (36.7 C)  98 F (36.7 C)   TempSrc:      SpO2: 92%  100%   Weight:      Height:       General - Elderly morbidly obese African-American female, restless in bed HEENT - PERRLA, EOMI, atraumatic head, non tender sinuses. Lung - Clear, diffuse Rales and rhonchi. Heart - S1, S2 heard, no murmurs, rubs, 2+ pedal edema. Abdomen  soft, obese, bowel sounds good Neuro - Alert, awake, able to answer me, non focal exam. Skin - Warm and dry. Data Reviewed:  CBC, BMP, blood sugars  Family Communication: patient's husband called over phone to give update but went to voice mail.  Disposition: Status is: Inpatient Remains inpatient  appropriate because: IV antifungal, antibiotic therapy, close neurological, hemodynamic status.  Planned Discharge Destination: Home with Home Health    Time spent: 45 minutes  Author: Marcelino Duster, MD 08/23/2022 2:40 PM  For on call review [email or web address].

## 2022-08-23 NOTE — Progress Notes (Signed)
[facility]  ROUNDING NOTE   Subjective:   [name] is a 52 year old African-American female with a past medical history of end-stage renal disease, patient is on Tuesday Thursday Saturday schedule, diabetes mellitus, history of pancreatitis who came to the ER with chief complaint of abdominal pain with foul smelling urine for 2 weeks.  Patient is known to our clinic and receives outpatient dialysis treatments at [facility] on a TTS schedule, supervised by Dr Thedore Mins.   Patient laying in bed IV team attempting to place PIV  Alert, states she didn't rest well overnight Complains of constipation  Objective:  Vital signs in last 24 hours:  Temp:  [97.6 F (36.4 C)-98.1 F (36.7 C)] 98 F (36.7 C) (06/28 1227) Pulse Rate:  [68-125] 106 (06/28 1316) Resp:  [15-22] 16 (06/28 1227) BP: (58-184)/(31-137) 129/105 (06/28 1316) SpO2:  [85 %-100 %] 100 % (06/28 1227) FiO2 (%):  [36 %] 36 % (06/27 2041) Weight:  [120.2 kg-122.7 kg] 122.7 kg (06/28 0700)  Weight change: 4.582 kg Filed Weights   08/22/22 1151 08/22/22 1655 08/23/22 0700  Weight: 120.2 kg 120.2 kg 122.7 kg    Intake/Output: I/O last 3 completed shifts: In: 120 [P.O.:120] Out: 300 [Other:300]   Intake/Output this shift:  No intake/output data recorded.  Physical Exam: General: NAD  Head: Normocephalic, atraumatic. Moist oral mucosal membranes  Eyes: Anicteric  Lungs:  Clear to auscultation, NCO2  Heart: Regular rate and rhythm  Abdomen:  Soft, nontender  Extremities:  No peripheral edema.  Neurologic: Alert, moving all four extremities  Skin: Chronic right foot wound  Access: Lt AVF     Basic Metabolic Panel: Recent Labs  Lab 08/19/22 0326 08/20/22 0536 08/21/22 0503 08/22/22 0507 08/23/22 0640  NA 132* 128* 127* 127* 133*  K 4.5 5.3* 5.0 5.6* 3.9  CL 94* 90* 89* 90* 101  CO2 20* 20* 22 19* 18*  GLUCOSE 160* 94 125* 87 67*  BUN 52* 70* 51* 65* 37*  CREATININE 6.25* 7.35*  5.89* 6.40* 3.74*  CALCIUM 7.3* 7.8* 7.8* 7.7* 6.3*     Liver Function Tests: Recent Labs  Lab 08/19/22 0326 08/20/22 0536 08/21/22 0503 08/22/22 0507 08/23/22 0640  AST 36 26 128* 220* 279*  ALT 26 23 50* 93* 97*  ALKPHOS 593* 585* 594* 650* 648*  BILITOT 1.4* 1.8* 2.5* 3.2* 2.8*  PROT 7.8 7.7 7.6 7.4 6.9  ALBUMIN 3.3* 3.3* 3.4* 3.2* 3.0*    No results for input(s): "LIPASE", "AMYLASE" in the last 168 hours. No results for input(s): "AMMONIA" in the last 168 hours.  CBC: Recent Labs  Lab 08/18/22 1211 08/19/22 0326 08/20/22 0536 08/21/22 0503 08/22/22 0507 08/23/22 0640  WBC 18.5* 17.3* 21.4* 23.8* 25.0* 21.7*  NEUTROABS 15.7*  --   --   --   --   --   HGB 13.1 13.4 13.5 14.0 14.1 13.5  HCT 40.6 43.0 43.5 44.6 45.1 42.0  MCV 93.1 95.3 94.6 96.1 96.2 93.1  PLT 323 273 305 325 345 300     Cardiac Enzymes: No results for input(s): "CKTOTAL", "CKMB", "CKMBINDEX", "TROPONINI" in the last 168 hours.  BNP: Invalid input(s): "POCBNP"  CBG: Recent Labs  Lab 08/22/22 2108 08/22/22 2217 08/23/22 0353 08/23/22 0819 08/23/22 1241  GLUCAP 65* 98 86 82 71     Microbiology: Results for orders placed or performed during the hospital encounter of 08/18/22  Urine Culture     Status: Abnormal   Collection Time: 08/18/22  3:07  PM   Specimen: Urine, Clean Catch  Result Value Ref Range Status   Specimen Description   Final    URINE, CLEAN CATCH Performed at [facility], [address] [place], [place] [address]    Special Requests   Final    NONE Performed at [facility], [address] [place], [place] [address]    Culture >=100,000 COLONIES/mL YEAST (A)  Final   Report Status 08/20/2022 FINAL  Final  Wet prep, genital     Status: Abnormal   Collection Time: 08/20/22  6:35 PM  Result Value Ref Range Status   Yeast Wet Prep HPF POC PRESENT (A) NONE SEEN Corrected    Comment: CORRECTED RESULTS CALLED TO: JEFF STRENK 08/22/22 1412  SH CORRECTED ON 06/27 AT 1406: PREVIOUSLY REPORTED AS NONE SEEN    Trich, Wet Prep NONE SEEN NONE SEEN Final   Clue Cells Wet Prep HPF POC NONE SEEN NONE SEEN Final   WBC, Wet Prep HPF POC >=10 (A) <10 Final   Sperm NONE SEEN  Final    Comment: Performed at [facility], [address] [place], [place] [address]  Chlamydia/NGC rt PCR [facility] only)     Status: None   Collection Time: 08/20/22  6:35 PM   Specimen: Cervical/Vaginal swab; Genital  Result Value Ref Range Status   Specimen source GC/Chlam ENDOCERVICAL  Final   Chlamydia Tr NOT DETECTED NOT DETECTED Final   N gonorrhoeae NOT DETECTED NOT DETECTED Final    Comment: (NOTE) This CT/NG assay has not been evaluated in patients with a history of  hysterectomy. Performed at [facility], [address] [place], [place] [address]     Coagulation Studies: No results for input(s): "LABPROT", "INR" in the last 72 hours.  Urinalysis: No results for input(s): "COLORURINE", "LABSPEC", "PHURINE", "GLUCOSEU", "HGBUR", "BILIRUBINUR", "KETONESUR", "PROTEINUR", "UROBILINOGEN", "NITRITE", "LEUKOCYTESUR" in the last 72 hours.  Invalid input(s): "APPERANCEUR"     Imaging: DG Chest Port 1 View  Result Date: 08/21/2022 CLINICAL DATA:  Hypoxia. EXAM: PORTABLE CHEST 1 VIEW COMPARISON:  Chest radiograph dated 08/18/2022. FINDINGS: Shallow inspiration. There is mild cardiomegaly with mild central vascular congestion. There is blunting of the right costophrenic angle which may represent trace effusion. Right lung base atelectasis or infiltrate. No pneumothorax. No acute osseous pathology. IMPRESSION: 1. Mild cardiomegaly with mild central vascular congestion. 2. Right lung base atelectasis or infiltrate. Electronically Signed   By: Elgie Collard M.D.   On: 08/21/2022 17:21     Medications:    anticoagulant sodium citrate     cefTRIAXone (ROCEPHIN)  IV 2 g (08/23/22 1108)    arformoterol  15 mcg Nebulization Q12H    aspirin EC  81 mg Oral Daily   budesonide  0.5 mg Nebulization BID   Chlorhexidine Gluconate Cloth  6 each Topical Q0600   cholecalciferol  1,000 Units Oral Daily   doxycycline  100 mg Oral Q12H   escitalopram  10 mg Oral Daily   fluconazole  100 mg Oral Daily   insulin aspart  0-9 Units Subcutaneous Q4H   insulin glargine-yfgn  10 Units Subcutaneous Daily   metroNIDAZOLE  500 mg Oral Q12H   montelukast  10 mg Oral QHS   pantoprazole  40 mg Oral Daily   sevelamer carbonate  2.4 g Oral TID WC   acetaminophen, albuterol, alteplase, anticoagulant sodium citrate, bisacodyl, fentaNYL (SUBLIMAZE) injection, heparin, lidocaine (PF), lidocaine-prilocaine, midodrine, ondansetron **OR** ondansetron (ZOFRAN) IV, oxybutynin, oxyCODONE, pentafluoroprop-tetrafluoroeth, pregabalin  Assessment/ Plan:  Ms. BRIEANNA BIRSCHBACH is a 52 y.o.  female with a past medical history of end-stage renal disease, patient is on Tuesday Thursday Saturday schedule, diabetes mellitus, history of pancreatitis who came to the ER with chief complaint of abdominal pain with foul smelling urine for 2 weeks.  [facility] TTS Left AVF 127.5kg   End stage renal disease on hemodialysis.  Dialysis received yesterday, UF due to hypotension. Next treatment scheduled for Saturday  2. Anemia of chronic kidney disease Lab Results  Component Value Date   HGB 13.5 08/23/2022    Hgb acceptable for this patient.  3. Secondary Hyperparathyroidism: with outpatient labs: PTH 2361, phosphorus 9.0, calcium 8.4 on 08/06/22.   Lab Results  Component Value Date   PTH 134 (H) 04/21/2017   CALCIUM 6.3 (LL) 08/23/2022   CAION 0.81 (LL) 10/26/2020   PHOS 8.2 (H) 04/20/2022    Corrected calcium 7.1. Ordered calcium gluconate 2g by primary team. Continue cholecalciferol and sevelamer with meals  4. Diabetes mellitus type II with chronic kidney disease/renal manifestations: noninsulin dependent. Most recent hemoglobin A1c is 7.8 on  08/18/22.   Glucose well controlled. Primary team to manage sliding scale insulin   LOS: 5 Adarryl Goldammer 6/28/20241:43 PM

## 2022-08-23 NOTE — Progress Notes (Signed)
[name]   DOB:12/12/1970   QI#:[id number]    Subjective: Patient sitting up in the bed; watching the phone.  Denies any new symptoms.  Denies any blood in stools or black or stools.  Denies any worsening vaginal bleeding.   Objective:  Vitals:   08/23/22 1227 08/23/22 1316  BP: (!) 58/47 (!) 129/105  Pulse: 72 (!) 106  Resp: 16   Temp: 98 F (36.7 C)   SpO2: 100%      Intake/Output Summary (Last 24 hours) at 08/23/2022 1407 Last data filed at 08/22/2022 1541 Gross per 24 hour  Intake --  Output 300 ml  Net -300 ml   Morbidly obese.  Physical Exam Vitals and nursing note reviewed.  HENT:     Head: Normocephalic and atraumatic.     Mouth/Throat:     Pharynx: Oropharynx is clear.  Eyes:     Extraocular Movements: Extraocular movements intact.     Pupils: Pupils are equal, round, and reactive to light.  Cardiovascular:     Rate and Rhythm: Normal rate and regular rhythm.  Pulmonary:     Comments: Decreased breath sounds bilaterally.  Abdominal:     Palpations: Abdomen is soft.  Musculoskeletal:        General: Normal range of motion.     Cervical back: Normal range of motion.  Skin:    General: Skin is warm.  Neurological:     General: No focal deficit present.     Mental Status: She is alert and oriented to person, place, and time.  Psychiatric:        Behavior: Behavior normal.        Judgment: Judgment normal.      Labs:  Lab Results  Component Value Date   WBC 21.7 (H) 08/23/2022   HGB 13.5 08/23/2022   HCT 42.0 08/23/2022   MCV 93.1 08/23/2022   PLT 300 08/23/2022   NEUTROABS 15.7 (H) 08/18/2022    Lab Results  Component Value Date   NA 133 (L) 08/23/2022   K 3.9 08/23/2022   CL 101 08/23/2022   CO2 18 (L) 08/23/2022    Studies:  DG Chest Port 1 View  Result Date: 08/21/2022 CLINICAL DATA:  Hypoxia. EXAM: PORTABLE CHEST 1 VIEW COMPARISON:  Chest radiograph dated 08/18/2022. FINDINGS: Shallow inspiration. There is mild cardiomegaly with  mild central vascular congestion. There is blunting of the right costophrenic angle which may represent trace effusion. Right lung base atelectasis or infiltrate. No pneumothorax. No acute osseous pathology. IMPRESSION: 1. Mild cardiomegaly with mild central vascular congestion. 2. Right lung base atelectasis or infiltrate. Electronically Signed   By: Elgie Collard M.D.   On: 08/21/2022 17:21    No problem-specific Assessment & Plan notes found for this encounter.  52 y.o.  female patient with multiple medical problems which are significant of ESRD on HD T/thurs/Sat , Chronic respiratory failure on home O2 4L [COPD OSA CHF], poorly controlled DMII, morbid obesity-currently admitted to hospital for UTI.  Patient noted to have possible cervical cancer   # Clinical concerns of cervical cancer-currently s/p biopsy.  Currently biopsy results pending.  Discussed with daughter the patient will need outpatient PET scan.  This could be done outpatient and patient discharged.  Patient also need a follow-up with gynecology oncology/radiation/medical oncology [?  Candidate for systemic therapy-? Carbo-taxol- ? Keytruda given multiple comorbidities].   # Multiple medical problems including chronic respiratory failure- as per primary team; ESRD- on HD.    #  Poorly controlled diabetes; multiple complications including nonhealing wound/ulcers   # Morbid obesity   # Above plan of care was discussed with patient/daughter, Manuella Ghazi in detail.   I have informed the gynecology oncology nurse navigator; and Consuello Masse, NP- for a follow up in my absence.   Earna Coder, MD 08/23/2022  2:07 PM

## 2022-08-23 NOTE — Progress Notes (Signed)
       CROSS COVER NOTE  NAME: [name] MRN: [id number] DOB : 18-Aug-1970    Concern as stated by nurse / staff    Patient removed IV 's x2 tonight IV team consulted due to difficult stick, IV team stated that they are not able to place PIV, Patient veins are too deep and splitting, according to IV team. Patient is getting IV ATB doxycycline and Flagyl due at 0600am. patient is unable to get IV atb due to no access.     Pertinent findings on chart review: Able to take po  Assessment and  Interventions   Assessment:  Plan: Doxycycline and metronidazole changed to oral form

## 2022-08-23 NOTE — Progress Notes (Signed)
       CROSS COVER NOTE  NAME: [name] MRN: [id number] DOB : 05/11/1970    Concern as stated by nurse / staff    Patient is requesting Linzess(linaclotide). Home med LINZESS CAP daily am for constipation patient stated that she has been trying to have BM since yesterday. Patient is A/O x3. admitted for sepsis due to UTI, Oncology consulted to rule out Cervical cancer, Biopsy completed yesterday. HD T-TH-S. patient unable to complete dialysis on Thursday due to low B/P. Patient has PRN midodrine. Patient is able to use commode x2 unsuccessful.     Pertinent findings on chart review:   Assessment and  Interventions   Assessment: Hesitant to order linzess at this time in setting of low blood pressure issues Plan: Dulcolax suppository PR as needed moderate to severe constipation Consider restarting linzess when appropriate       Donnie Mesa NP [facility] Cross Cover 7pm-7am - check amion for availability Pager [phone]

## 2022-08-23 NOTE — Progress Notes (Addendum)
This RN responded to call bell for neb tx at 1335; patient was on [facility]. Provided patient with neb tx at 1341 (see MAR). Re-entered room at approximately 1353 to remove neb mask and replace o2 at 4L. Patient stated she was not ready to get back in bed. Patient rang call bell again around 1405 asking for inhaler. Reminded patient she does not have PRN inhaler and had just received neb tx. Patient wished to remain seated on BSC. Patient rang again at approximately 1408 asking to go back to bed. NT called. Patient rang again at approximately 1410 saying "I'm falling." This RN entered room approximately 30 seconds later and pt was sitting on floor on her bottom with legs extended in front of her. There were three small hard bloody stools in BSC. Grippy socks applied, hoyer used to replace patient in bed. Patient denies dizziness, SOB, CP.   When asked why patient didn't say anything the previous three times RN was in the room, she said she "rang forever and couldn't wait any longer."     08/23/22 1441  What Happened  Was fall witnessed? No  Was patient injured? No  Patient found on floor  Found by Staff-comment  Stated prior activity other (comment) (from [facility] to bed)  Provider Notification  Provider Name/Title Sreram  Date Provider Notified 08/23/22  Time Provider Notified 1441  Method of Notification Page  Notification Reason Fall  Provider response No new orders  Follow Up  Progress note created (see row info) Yes  Adult Fall Risk Assessment  Risk Factor Category (scoring not indicated) High fall risk per protocol (document High fall risk)  Adult Fall Risk Interventions  Required Bundle Interventions *See Row Information* High fall risk - low, moderate, and high requirements implemented  Additional Interventions Use of appropriate toileting equipment (bedpan, BSC, etc.)  Fall intervention(s) refused/Patient educated regarding refusal Nonskid socks;Open door if unsupervised;Supervision while  toileting/edge of bed sitting  Screening for Fall Injury Risk (To be completed on HIGH fall risk patients) - Assessing Need for Floor Mats  Risk For Fall Injury- Criteria for Floor Mats Admitted as a result of a fall  Will Implement Floor Mats Yes

## 2022-08-23 NOTE — Plan of Care (Signed)

## 2022-08-23 NOTE — Inpatient Diabetes Management (Signed)
Inpatient Diabetes Program Recommendations  AACE/ADA: New Consensus Statement on Inpatient Glycemic Control   Target Ranges:  Prepandial:   less than 140 mg/dL      Peak postprandial:   less than 180 mg/dL (1-2 hours)      Critically ill patients:  140 - 180 mg/dL    Latest Reference Range & Units 08/22/22 04:14 08/22/22 09:00 08/22/22 10:38 08/22/22 11:13 08/22/22 17:14 08/22/22 21:08 08/22/22 22:17 08/23/22 03:53  Glucose-Capillary 70 - 99 mg/dL 72 161 (H) 096 (H) 045 (H) 98 65 (L) 98 86    Review of Glycemic Control  Diabetes history: DM2 Outpatient Diabetes medications: Tresiba 20 units QHS Current orders for Inpatient glycemic control: Semglee 15 units daily, Novolog 0-9 units Q4H  Inpatient Diabetes Program Recommendations:    Insulin: CBG down to 65 mg/dl at 40:98 on 03/15/12 and 86 mg/dl at 7:82 am today. Please consider decreasing Semglee to 10 units daily.  Thanks, Orlando Penner, RN, MSN, CDCES Diabetes Coordinator Inpatient Diabetes Program [phone] (Team Pager from 8am to 5pm)

## 2022-08-23 NOTE — Care Management Important Message (Signed)
Important Message  Patient Details  Name: [name] MRN: [id number] Date of Birth: 1970-03-05   Medicare Important Message Given:  Yes     Johnell Comings 08/23/2022, 10:17 AM

## 2022-08-24 DIAGNOSIS — B3749 Other urogenital candidiasis: Secondary | ICD-10-CM | POA: Diagnosis not present

## 2022-08-24 DIAGNOSIS — E119 Type 2 diabetes mellitus without complications: Secondary | ICD-10-CM | POA: Diagnosis not present

## 2022-08-24 DIAGNOSIS — I953 Hypotension of hemodialysis: Secondary | ICD-10-CM

## 2022-08-24 DIAGNOSIS — J9621 Acute and chronic respiratory failure with hypoxia: Secondary | ICD-10-CM | POA: Diagnosis not present

## 2022-08-24 DIAGNOSIS — N186 End stage renal disease: Secondary | ICD-10-CM | POA: Diagnosis not present

## 2022-08-24 LAB — COMPREHENSIVE METABOLIC PANEL
ALT: 64 U/L — ABNORMAL HIGH (ref 0–44)
ALT: 57 U/L — ABNORMAL HIGH (ref 0–44)
AST: 190 U/L — ABNORMAL HIGH (ref 15–41)
AST: 173 U/L — ABNORMAL HIGH (ref 15–41)
Albumin: 3.1 g/dL — ABNORMAL LOW (ref 3.5–5.0)
Albumin: 3.4 g/dL — ABNORMAL LOW (ref 3.5–5.0)
Alkaline Phosphatase: 626 U/L — ABNORMAL HIGH (ref 38–126)
Alkaline Phosphatase: 597 U/L — ABNORMAL HIGH (ref 38–126)
Anion gap: 16 — ABNORMAL HIGH (ref 5–15)
Anion gap: 22 — ABNORMAL HIGH (ref 5–15)
BUN: 53 mg/dL — ABNORMAL HIGH (ref 6–20)
BUN: 48 mg/dL — ABNORMAL HIGH (ref 6–20)
CO2: 20 mmol/L — ABNORMAL LOW (ref 22–32)
CO2: 15 mmol/L — ABNORMAL LOW (ref 22–32)
Calcium: 7.8 mg/dL — ABNORMAL LOW (ref 8.9–10.3)
Calcium: 7.8 mg/dL — ABNORMAL LOW (ref 8.9–10.3)
Chloride: 91 mmol/L — ABNORMAL LOW (ref 98–111)
Chloride: 91 mmol/L — ABNORMAL LOW (ref 98–111)
Creatinine, Ser: 5.73 mg/dL — ABNORMAL HIGH (ref 0.44–1.00)
Creatinine, Ser: 5.42 mg/dL — ABNORMAL HIGH (ref 0.44–1.00)
GFR, Estimated: 8 mL/min — ABNORMAL LOW (ref >60)
GFR, Estimated: 9 mL/min — ABNORMAL LOW (ref >60)
Glucose, Bld: 66 mg/dL — ABNORMAL LOW (ref 70–99)
Glucose, Bld: 82 mg/dL (ref 70–99)
Potassium: 5.6 mmol/L — ABNORMAL HIGH (ref 3.5–5.1)
Potassium: 5.1 mmol/L (ref 3.5–5.1)
Sodium: 127 mmol/L — ABNORMAL LOW (ref 135–145)
Sodium: 128 mmol/L — ABNORMAL LOW (ref 135–145)
Total Bilirubin: 2 mg/dL — ABNORMAL HIGH (ref 0.3–1.2)
Total Bilirubin: 2 mg/dL — ABNORMAL HIGH (ref 0.3–1.2)
Total Protein: 6.9 g/dL (ref 6.5–8.1)
Total Protein: 7.1 g/dL (ref 6.5–8.1)

## 2022-08-24 LAB — CBC
HCT: 41.8 % (ref 36.0–46.0)
Hemoglobin: 13.5 g/dL (ref 12.0–15.0)
MCH: 29.4 pg (ref 26.0–34.0)
MCHC: 32.3 g/dL (ref 30.0–36.0)
MCV: 91.1 fL (ref 80.0–100.0)
Platelets: 298 10*3/uL (ref 150–400)
RBC: 4.59 MIL/uL (ref 3.87–5.11)
RDW: 19.4 % — ABNORMAL HIGH (ref 11.5–15.5)
WBC: 19.4 10*3/uL — ABNORMAL HIGH (ref 4.0–10.5)
nRBC: 3.6 % — ABNORMAL HIGH (ref 0.0–0.2)

## 2022-08-24 LAB — GLUCOSE, CAPILLARY
Glucose-Capillary: 63 mg/dL — ABNORMAL LOW (ref 70–99)
Glucose-Capillary: 77 mg/dL (ref 70–99)
Glucose-Capillary: 59 mg/dL — ABNORMAL LOW (ref 70–99)
Glucose-Capillary: 81 mg/dL (ref 70–99)
Glucose-Capillary: 74 mg/dL (ref 70–99)
Glucose-Capillary: 87 mg/dL (ref 70–99)
Glucose-Capillary: 100 mg/dL — ABNORMAL HIGH (ref 70–99)

## 2022-08-24 MED ORDER — SODIUM ZIRCONIUM CYCLOSILICATE 10 G PO PACK
10.0000 g | PACK | Freq: Two times a day (BID) | ORAL | Status: AC
  Administered 2022-08-24 (×2): 10 g via ORAL
  Filled 2022-08-24 (×2): qty 1

## 2022-08-24 MED ORDER — NALOXONE HCL 0.4 MG/ML IJ SOLN: 0.4000 mg | INTRAMUSCULAR | Status: DC | PRN

## 2022-08-24 MED ORDER — ALBUMIN HUMAN 25 % IV SOLN
12.5000 g | Freq: Once | INTRAVENOUS | Status: AC
  Administered 2022-08-24: 12.5 g via INTRAVENOUS

## 2022-08-24 MED ORDER — GLUCOSE 4 G PO CHEW
CHEWABLE_TABLET | ORAL | Status: AC
  Filled 2022-08-24: qty 1

## 2022-08-24 MED ORDER — MIDODRINE HCL 5 MG PO TABS
ORAL_TABLET | ORAL | Status: AC
  Filled 2022-08-24: qty 4

## 2022-08-24 MED ORDER — ALBUMIN HUMAN 25 % IV SOLN
INTRAVENOUS | Status: AC
  Filled 2022-08-24: qty 50

## 2022-08-24 NOTE — Plan of Care (Signed)
  Problem: Education: Goal: Knowledge of General Education information will improve Description Including pain rating scale, medication(s)/side effects and non-pharmacologic comfort measures Outcome: Progressing   Problem: Education: Goal: Knowledge of General Education information will improve Description Including pain rating scale, medication(s)/side effects and non-pharmacologic comfort measures Outcome: Progressing   

## 2022-08-24 NOTE — Progress Notes (Signed)
Hemodialysis Note  Received patient in bed to unit. Alert and oriented. Informed consent signed and in chart.   Treatment initiated:0928 Treatment completed:1048  Patient tolerated treatment well. Transported back to the room alert, without acute distress. Report given to patient's RN.  Access used:LUA AVF  Access issues:None   Total UF removed:-0.7 ml  Medications given:Midodrine, Albumin  Post HD VS: Soft B/P throughout course of treatment, managed with NS bolus, meds, no attempt at fluid removal.  Treatment terminated per MD instructions.  Post HD weight:124.8 kg   Patient received from unit, drowsy, but easy to arousal and able to answer question.. At the initiation of treatment, B/P soft, contacted Nephrology team with findings, patient was given PRN dose of Midodrine, NS bolus, and UF held. Patient 's B/P managed with NS bolus, Midodrine, and Albumin, Nephrology team during rounds determined to terminate treatment due to B/P. Patient received 57 mins.of treatment.   Bartolo Darter, RN [facility]

## 2022-08-24 NOTE — Progress Notes (Signed)
[facility]  ROUNDING NOTE   Subjective:   [name] is a 52 year old African-American female with a past medical history of end-stage renal disease, patient is on Tuesday Thursday Saturday schedule, diabetes mellitus, history of pancreatitis who came to the ER with chief complaint of abdominal pain with foul smelling urine for 2 weeks.  Patient is known to our clinic and receives outpatient dialysis treatments at [facility] on a TTS schedule, supervised by Dr Thedore Mins.   Patient seen and evaluated during dialysis   HEMODIALYSIS FLOWSHEET:  Blood Flow Rate (mL/min): 350 mL/min Arterial Pressure (mmHg): -150 mmHg Venous Pressure (mmHg): 230 mmHg TMP (mmHg): 4 mmHg Ultrafiltration Rate (mL/min): 0 mL/min Dialysate Flow Rate (mL/min): 300 ml/min Dialysis Fluid Bolus: Normal Saline Bolus Amount (mL): 100 mL  Somnolent but arousable during treatment Hypotensive  Objective:  Vital signs in last 24 hours:  Temp:  [97.2 F (36.2 C)-98 F (36.7 C)] 97.8 F (36.6 C) (06/29 0900) Pulse Rate:  [42-106] 72 (06/29 1030) Resp:  [0-20] 0 (06/29 1030) BP: (47-129)/(18-105) 50/23 (06/29 1030) SpO2:  [90 %-100 %] 97 % (06/29 1030) Weight:  [123.4 kg] 123.4 kg (06/29 0900)  Weight change:  Filed Weights   08/22/22 1655 08/23/22 0700 08/24/22 0900  Weight: 120.2 kg 122.7 kg 123.4 kg    Intake/Output: I/O last 3 completed shifts: In: 150 [P.O.:150] Out: -    Intake/Output this shift:  No intake/output data recorded.  Physical Exam: General: Ill appearing  Head: Normocephalic, atraumatic. Moist oral mucosal membranes  Eyes: Anicteric  Lungs:  Clear to auscultation, NCO2  Heart: Regular rate and rhythm  Abdomen:  Soft, nontender, obese  Extremities:  No peripheral edema.  Neurologic: Somnolent, moving all four extremities  Skin: Chronic right foot wound  Access: Lt AVF     Basic Metabolic Panel: Recent Labs  Lab 08/20/22 0536 08/21/22 0503  08/22/22 0507 08/23/22 0640 08/24/22 0605  NA 128* 127* 127* 133* 127*  K 5.3* 5.0 5.6* 3.9 5.6*  CL 90* 89* 90* 101 91*  CO2 20* 22 19* 18* 20*  GLUCOSE 94 125* 87 67* 66*  BUN 70* 51* 65* 37* 53*  CREATININE 7.35* 5.89* 6.40* 3.74* 5.73*  CALCIUM 7.8* 7.8* 7.7* 6.3* 7.8*     Liver Function Tests: Recent Labs  Lab 08/20/22 0536 08/21/22 0503 08/22/22 0507 08/23/22 0640 08/24/22 0605  AST 26 128* 220* 279* 190*  ALT 23 50* 93* 97* 64*  ALKPHOS 585* 594* 650* 648* 626*  BILITOT 1.8* 2.5* 3.2* 2.8* 2.0*  PROT 7.7 7.6 7.4 6.9 6.9  ALBUMIN 3.3* 3.4* 3.2* 3.0* 3.1*    No results for input(s): "LIPASE", "AMYLASE" in the last 168 hours. No results for input(s): "AMMONIA" in the last 168 hours.  CBC: Recent Labs  Lab 08/18/22 1211 08/19/22 0326 08/20/22 0536 08/21/22 0503 08/22/22 0507 08/23/22 0640 08/24/22 0605  WBC 18.5*   < > 21.4* 23.8* 25.0* 21.7* 19.4*  NEUTROABS 15.7*  --   --   --   --   --   --   HGB 13.1   < > 13.5 14.0 14.1 13.5 13.5  HCT 40.6   < > 43.5 44.6 45.1 42.0 41.8  MCV 93.1   < > 94.6 96.1 96.2 93.1 91.1  PLT 323   < > 305 325 345 300 298   < > = values in this interval not displayed.     Cardiac Enzymes: No results for input(s): "CKTOTAL", "CKMB", "CKMBINDEX", "TROPONINI" in  the last 168 hours.  BNP: Invalid input(s): "POCBNP"  CBG: Recent Labs  Lab 08/23/22 2035 08/24/22 0111 08/24/22 0349 08/24/22 0750 08/24/22 0942  GLUCAP 114* 63* 77 59* 81     Microbiology: Results for orders placed or performed during the hospital encounter of 08/18/22  Urine Culture     Status: Abnormal   Collection Time: 08/18/22  3:07 PM   Specimen: Urine, Clean Catch  Result Value Ref Range Status   Specimen Description   Final    URINE, CLEAN CATCH Performed at [facility], [address], [place], [place] [address]    Special Requests   Final    NONE Performed at [facility], [address] [place], [place]  [address]    Culture >=100,000 COLONIES/mL YEAST (A)  Final   Report Status 08/20/2022 FINAL  Final  Wet prep, genital     Status: Abnormal   Collection Time: 08/20/22  6:35 PM  Result Value Ref Range Status   Yeast Wet Prep HPF POC PRESENT (A) NONE SEEN Corrected    Comment: CORRECTED RESULTS CALLED TO: JEFF STRENK 08/22/22 1412 SH CORRECTED ON 06/27 AT 1406: PREVIOUSLY REPORTED AS NONE SEEN    Trich, Wet Prep NONE SEEN NONE SEEN Final   Clue Cells Wet Prep HPF POC NONE SEEN NONE SEEN Final   WBC, Wet Prep HPF POC >=10 (A) <10 Final   Sperm NONE SEEN  Final    Comment: Performed at [facility], [address] [place], [place] [address]  Chlamydia/NGC rt PCR [facility] only)     Status: None   Collection Time: 08/20/22  6:35 PM   Specimen: Cervical/Vaginal swab; Genital  Result Value Ref Range Status   Specimen source GC/Chlam ENDOCERVICAL  Final   Chlamydia Tr NOT DETECTED NOT DETECTED Final   N gonorrhoeae NOT DETECTED NOT DETECTED Final    Comment: (NOTE) This CT/NG assay has not been evaluated in patients with a history of  hysterectomy. Performed at [facility], [address] [place], [place] [address]     Coagulation Studies: No results for input(s): "LABPROT", "INR" in the last 72 hours.  Urinalysis: No results for input(s): "COLORURINE", "LABSPEC", "PHURINE", "GLUCOSEU", "HGBUR", "BILIRUBINUR", "KETONESUR", "PROTEINUR", "UROBILINOGEN", "NITRITE", "LEUKOCYTESUR" in the last 72 hours.  Invalid input(s): "APPERANCEUR"     Imaging: No results found.   Medications:    albumin human     anticoagulant sodium citrate     cefTRIAXone (ROCEPHIN)  IV 2 g (08/23/22 1108)    arformoterol  15 mcg Nebulization Q12H   aspirin EC  81 mg Oral Daily   budesonide  0.5 mg Nebulization BID   Chlorhexidine Gluconate Cloth  6 each Topical Q0600   cholecalciferol  1,000 Units Oral Daily   doxycycline  100 mg Oral Q12H   escitalopram  10 mg Oral Daily    fluconazole  100 mg Oral Daily   glucose       insulin aspart  0-9 Units Subcutaneous Q4H   insulin glargine-yfgn  10 Units Subcutaneous Daily   metroNIDAZOLE  500 mg Oral Q12H   montelukast  10 mg Oral QHS   pantoprazole  40 mg Oral Daily   sevelamer carbonate  2.4 g Oral TID WC   acetaminophen, albuterol, alteplase, anticoagulant sodium citrate, bisacodyl, fentaNYL (SUBLIMAZE) injection, glucose, heparin, lidocaine (PF), lidocaine-prilocaine, midodrine, naLOXone (NARCAN)  injection, ondansetron **OR** ondansetron (ZOFRAN) IV, oxybutynin, oxyCODONE, pentafluoroprop-tetrafluoroeth, pregabalin  Assessment/ Plan:  [name] is a  52 y.o.  female with a past medical history of end-stage renal disease, patient is on Tuesday Thursday Saturday schedule, diabetes mellitus, history of pancreatitis who came to the ER with chief complaint of abdominal pain with foul smelling urine for 2 weeks.  [facility] TTS Left AVF 127.5kg   End stage renal disease on hemodialysis.  Receiving dialysis today, UF reduced to 0. Patient hypotension, UF turned off, ordered albumin 12.5g ordered for BP support. NS bolus given per HD RN. Due to sustained hypotension, will terminate treatment. Next treatment scheduled for Monday. Due to somnolence, Narcan 0.78mcg IV ordered PRN.   2. Anemia of chronic kidney disease Lab Results  Component Value Date   HGB 13.5 08/24/2022    Hgb at goal  3. Secondary Hyperparathyroidism: with outpatient labs: PTH 2361, phosphorus 9.0, calcium 8.4 on 08/06/22.   Lab Results  Component Value Date   PTH 134 (H) 04/21/2017   CALCIUM 7.8 (L) 08/24/2022   CAION 0.81 (LL) 10/26/2020   PHOS 8.2 (H) 04/20/2022    Calcium improved with IV supplementation yesterday, Continue cholecalciferol and sevelamer with meals  4. Diabetes mellitus type II with chronic kidney disease/renal manifestations: noninsulin dependent. Most recent hemoglobin A1c is 7.8 on 08/18/22.   Glucose well  controlled. Primary team to manage sliding scale insulin  5. Hyperkalemia, potassium 5.6. Attempted to treat with dialysis however treatment terminated early. Will order binders.    LOS: 6 [name] 6/29/202411:21 AM

## 2022-08-24 NOTE — Progress Notes (Signed)
Progress Note   Patient: [name] QMV:[id number] DOB: [birth year] DOA: 08/18/2022     6 DOS: the patient was seen and examined on 08/24/2022   Brief hospital course: Meriwether Shnayder. Wurtzel is a 52 y.o. female with medical history significant of  ESRD on HD TTS  with minimal urine out put, Asthma/COPD/OSA/OSH intolerant to bipap, Chronic respiratory failure on home O2 4L,  HTN, Hfpef, anxiety,  DMII, obesity, chronic wound right foot followed by wound care presented to ED with interim hx of dysuria as well as foul odor associated with her urine.Patient has been seen for this as outpatient and was placed on oral antibiotics x 2 weeks and still having symptoms. Patient notes that over the last few days she has had increase lower abdominal pain /pressure with radiation towards her flank area.  UA was noted to be abnormal.  CT scan did not show any acute findings. Labs showed BUN 45 creatinine 5.6 bicarb 22 normal LFTs, leukocytosis at 18.5 A1c 7.8 TSH 2.5 UA abnormal WBC more than 50 leukocyte moderate nitrate negative, urine culture sent and patient admitted on IV antibiotics for failed outpatient treatment  Assessment and Plan: Yeast UTI Vaginal discharge Wet prep done positive for yeast- Continue fluconazole therapy. Continue Rocephin, flagyl, doxy for possible PID.  Enlarged uterus with degenerating fibroid Vaginal discharge- CT scan suggesting enlarged uterus secondary to fibroid.  She does have vaginal discharge.  Continue Flagyl and doxycycline therapy for possible PID. Oncology team suggested outpatient PET scan. Cervical biopsy pending.  Metabolic encephalopathy In the setting of infection, co2 narcosis Encourage to be compliant with bipap.  Neuro checks. Fall, aspiration precautions. Encourage out of bed, incentive spirometry.  Hyponatremia- ESRD on hemodialysis- Nephrology on board for dialysis needs.  Hypotension- caution with antihypertensives. Monitor vitals  closely.  Elevated LFT-  Alk phos chronically elevated, AST, ALT trending down. No sonographic evidence of cholecystitis. Trend LFT, avoid hepatotoxic drugs.  Chronic right foot wound- Prior MRI reviewed with underlying subacute to chronic osteomyelitis of first through fourth metatarsal heads. Seen by wound care.  Continue dressing changes. Outpatient wound care.  Chronic respiratory failure with hypoxia- Obesity hypoventilation syndrome- High CO2 level noted on ABG likely contributing to lethargy. Encourage BiPAP at bedtime, 4 L supplemental oxygen as needed.  Anxiety disorder-continue Lexapro.  Chronic hypotension-BP lower side, on midodrine as needed during HD.  Chronic diastolic CHF-euvolemic at this time, fluid management per dialysis.  Type 2 diabetes mellitus- A1c 7.8.  eating poor, low sugars noted. Hold long-acting insulin if persistent low sugars, continue sliding scale coverage for now.  Morbid obesity with BMI 44.2- Diet, exercise and weight loss advised.  DVT prophylaxis- Heparin. CODE STATUS - FULL.      Subjective: Patient is seen and examined today morning after dialysis. Low BP with HD noted. Sleepy but arousable. Advised to be complaint with Bipap.  Physical Exam: Vitals:   08/24/22 1130 08/24/22 1229 08/24/22 1231 08/24/22 1238  BP: (!) 62/53 (!) 97/46    Pulse: 73 81  79  Resp: 18 16    Temp:    98 F (36.7 C)  TempSrc:      SpO2: 97%   (!) 89%  Weight:   124.8 kg   Height:       General - Elderly morbidly obese African-American female, restless in bed HEENT - PERRLA, EOMI, atraumatic head, non tender sinuses. Lung - Clear, diffuse Rales and rhonchi. Heart - S1, S2 heard, no murmurs, rubs, 2+ pedal edema.  Abdomen soft, obese, bowel sounds good Neuro - Alert, awake, able to answer me, non focal exam. Skin - Warm and dry. Data Reviewed:  CBC, CMP, blood sugars  Family Communication: unable to reach patient's husband over  phone.  Disposition: Status is: Inpatient Remains inpatient appropriate because: IV antifungal, antibiotic therapy, close neurological, hemodynamic status.  Planned Discharge Destination: Home with Home Health    Time spent: 45 minutes  Author: Marcelino Duster, MD 08/24/2022 2:56 PM  For on call review [email or web address].

## 2022-08-25 ENCOUNTER — Inpatient Hospital Stay: Payer: 59

## 2022-08-25 DIAGNOSIS — J9621 Acute and chronic respiratory failure with hypoxia: Secondary | ICD-10-CM | POA: Diagnosis not present

## 2022-08-25 DIAGNOSIS — J9611 Chronic respiratory failure with hypoxia: Secondary | ICD-10-CM | POA: Diagnosis not present

## 2022-08-25 DIAGNOSIS — G9341 Metabolic encephalopathy: Secondary | ICD-10-CM

## 2022-08-25 DIAGNOSIS — I9589 Other hypotension: Secondary | ICD-10-CM

## 2022-08-25 DIAGNOSIS — J9612 Chronic respiratory failure with hypercapnia: Secondary | ICD-10-CM

## 2022-08-25 DIAGNOSIS — N186 End stage renal disease: Secondary | ICD-10-CM | POA: Diagnosis not present

## 2022-08-25 DIAGNOSIS — N888 Other specified noninflammatory disorders of cervix uteri: Secondary | ICD-10-CM | POA: Diagnosis not present

## 2022-08-25 DIAGNOSIS — Z992 Dependence on renal dialysis: Secondary | ICD-10-CM | POA: Diagnosis not present

## 2022-08-25 LAB — BLOOD GAS, VENOUS
Acid-base deficit: 5.5 mmol/L — ABNORMAL HIGH (ref 0.0–2.0)
Acid-base deficit: 9.6 mmol/L — ABNORMAL HIGH (ref 0.0–2.0)
Bicarbonate: 23.2 mmol/L (ref 20.0–28.0)
Bicarbonate: 17.6 mmol/L — ABNORMAL LOW (ref 20.0–28.0)
O2 Content: 10 L/min
O2 Content: 10 L/min
O2 Saturation: 86.6 %
O2 Saturation: 90.2 %
Patient temperature: 37
Patient temperature: 37
pCO2, Ven: 58 mmHg (ref 44–60)
pCO2, Ven: 42 mmHg — ABNORMAL LOW (ref 44–60)
pH, Ven: 7.21 — ABNORMAL LOW (ref 7.25–7.43)
pH, Ven: 7.23 — ABNORMAL LOW (ref 7.25–7.43)
pO2, Ven: 60 mmHg — ABNORMAL HIGH (ref 32–45)
pO2, Ven: 64 mmHg — ABNORMAL HIGH (ref 32–45)

## 2022-08-25 LAB — GLUCOSE, CAPILLARY
Glucose-Capillary: 105 mg/dL — ABNORMAL HIGH (ref 70–99)
Glucose-Capillary: 107 mg/dL — ABNORMAL HIGH (ref 70–99)
Glucose-Capillary: 59 mg/dL — ABNORMAL LOW (ref 70–99)
Glucose-Capillary: 76 mg/dL (ref 70–99)
Glucose-Capillary: 79 mg/dL (ref 70–99)
Glucose-Capillary: 89 mg/dL (ref 70–99)
Glucose-Capillary: 90 mg/dL (ref 70–99)
Glucose-Capillary: 92 mg/dL (ref 70–99)
Glucose-Capillary: 93 mg/dL (ref 70–99)

## 2022-08-25 LAB — CBC
HCT: 41.4 % (ref 36.0–46.0)
Hemoglobin: 13.5 g/dL (ref 12.0–15.0)
MCH: 29.9 pg (ref 26.0–34.0)
MCHC: 32.6 g/dL (ref 30.0–36.0)
MCV: 91.6 fL (ref 80.0–100.0)
Platelets: 272 10*3/uL (ref 150–400)
RBC: 4.52 MIL/uL (ref 3.87–5.11)
RDW: 19.8 % — ABNORMAL HIGH (ref 11.5–15.5)
WBC: 25.4 10*3/uL — ABNORMAL HIGH (ref 4.0–10.5)
nRBC: 5.4 % — ABNORMAL HIGH (ref 0.0–0.2)

## 2022-08-25 LAB — MRSA NEXT GEN BY PCR, NASAL: MRSA by PCR Next Gen: NOT DETECTED

## 2022-08-25 LAB — RENAL FUNCTION PANEL
Albumin: 3.1 g/dL — ABNORMAL LOW (ref 3.5–5.0)
Anion gap: 16 — ABNORMAL HIGH (ref 5–15)
BUN: 51 mg/dL — ABNORMAL HIGH (ref 6–20)
CO2: 20 mmol/L — ABNORMAL LOW (ref 22–32)
Calcium: 7.8 mg/dL — ABNORMAL LOW (ref 8.9–10.3)
Chloride: 91 mmol/L — ABNORMAL LOW (ref 98–111)
Creatinine, Ser: 5.8 mg/dL — ABNORMAL HIGH (ref 0.44–1.00)
GFR, Estimated: 8 mL/min — ABNORMAL LOW (ref >60)
Glucose, Bld: 95 mg/dL (ref 70–99)
Phosphorus: 7.1 mg/dL — ABNORMAL HIGH (ref 2.5–4.6)
Potassium: 5 mmol/L (ref 3.5–5.1)
Sodium: 127 mmol/L — ABNORMAL LOW (ref 135–145)

## 2022-08-25 LAB — LACTIC ACID, PLASMA
Lactic Acid, Venous: 4.1 mmol/L (ref 0.5–1.9)
Lactic Acid, Venous: 3.8 mmol/L (ref 0.5–1.9)

## 2022-08-25 MED ORDER — DEXTROSE 50 % IV SOLN: 12.5000 g | INTRAVENOUS | Status: AC

## 2022-08-25 MED ORDER — DEXTROSE 50 % IV SOLN
INTRAVENOUS | Status: AC
  Administered 2022-08-25: 50 mL
  Filled 2022-08-25: qty 50

## 2022-08-25 MED ORDER — MIDODRINE HCL 5 MG PO TABS
10.0000 mg | ORAL_TABLET | Freq: Three times a day (TID) | ORAL | Status: DC
  Administered 2022-08-26 (×2): 10 mg via ORAL
  Filled 2022-08-25 (×2): qty 2

## 2022-08-25 MED ORDER — CHLORHEXIDINE GLUCONATE CLOTH 2 % EX PADS
6.0000 | MEDICATED_PAD | Freq: Every day | CUTANEOUS | Status: DC
  Administered 2022-08-25 – 2022-09-01 (×9): 6 via TOPICAL

## 2022-08-25 NOTE — Progress Notes (Signed)
A consult was placed to the hospital's IV Nurse for new iv access; pt was seen on night shift by IV Therapy also, who re-dressed, flushed the iv she had at that time;  that IV is no longer working; attempted x 2 to restart a new IV ; attempted x2 in the RAFA, and RUA using ultrasound;  veins are deep with significant scar tissue noted; unable to thread catheters; pt has been seen by 3 IV Nurses in the last 2 days; MD and RN aware of no access at this time; suggest central access for this pt.

## 2022-08-25 NOTE — Progress Notes (Signed)
Dr. Clide Dales at bedside and aware that IV team nurse has attempted to obtain iv access on patient and unable to do so. MD aware that patient has no IV access at this time.

## 2022-08-25 NOTE — Consult Note (Signed)
[facility] Vascular Consult Note  MRN : [id number]  [name] is a 52 y.o. (1970-03-17) female who presents with chief complaint of  Chief Complaint  Patient presents with   Abdominal Pain   Dysuria  .  History of Present Illness: 81 yof with DM, ESRD, UTI- sepsis, hypotension, Vasculopath, poor peripheral access. Hypotension and lethargy. Unable to obtain peripheral access- asked to place central line.  Current Facility-Administered Medications  Medication Dose Route Frequency Provider Last Rate Last Admin   acetaminophen (TYLENOL) tablet 1,000 mg  1,000 mg Oral Q6H PRN Skip Mayer A, MD   1,000 mg at 08/23/22 2035   albuterol (PROVENTIL) (2.5 MG/3ML) 0.083% nebulizer solution 2.5 mg  2.5 mg Nebulization Q6H PRN Skip Mayer A, MD   2.5 mg at 08/23/22 2030   alteplase (CATHFLO ACTIVASE) injection 2 mg  2 mg Intracatheter Once PRN Wendee Beavers, NP       anticoagulant sodium citrate solution 5 mL  5 mL Intracatheter PRN Wendee Beavers, NP       arformoterol (BROVANA) nebulizer solution 15 mcg  15 mcg Nebulization Q12H Skip Mayer A, MD   15 mcg at 08/25/22 4098   aspirin EC tablet 81 mg  81 mg Oral Daily Skip Mayer A, MD   81 mg at 08/25/22 1112   bisacodyl (DULCOLAX) suppository 10 mg  10 mg Rectal Daily PRN Manuela Schwartz, NP       budesonide (PULMICORT) nebulizer solution 0.5 mg  0.5 mg Nebulization BID Skip Mayer A, MD   0.5 mg at 08/25/22 0727   cefTRIAXone (ROCEPHIN) 2 g in sodium chloride 0.9 % 100 mL IVPB  2 g Intravenous Q24H Ronnald Ramp, RPH 200 mL/hr at 08/24/22 1335 2 g at 08/24/22 1335   cholecalciferol (VITAMIN D3) 25 MCG (1000 UNIT) tablet 1,000 Units  1,000 Units Oral Daily Skip Mayer A, MD   1,000 Units at 08/25/22 1112   doxycycline (VIBRA-TABS) tablet 100 mg  100 mg Oral Q12H Manuela Schwartz, NP   100 mg at 08/25/22 1112   escitalopram (LEXAPRO) tablet 10 mg  10 mg Oral Daily Skip Mayer A, MD   10 mg at 08/25/22 1112   fentaNYL (SUBLIMAZE) injection 25 mcg  25 mcg Intravenous Q2H PRN Osvaldo Shipper, MD   25 mcg at 08/19/22 1191   fluconazole (DIFLUCAN) tablet 100 mg  100 mg Oral Daily Jaynie Bream, RPH   100 mg at 08/24/22 2048   heparin injection 1,000 Units  1,000 Units Intracatheter PRN Wendee Beavers, NP       insulin aspart (novoLOG) injection 0-9 Units  0-9 Units Subcutaneous Q4H Lindajo Royal V, MD   1 Units at 08/22/22 1049   lidocaine (PF) (XYLOCAINE) 1 % injection 5 mL  5 mL Intradermal PRN Wendee Beavers, NP       lidocaine-prilocaine (EMLA) cream   Topical PRN Lurline Del, MD       metroNIDAZOLE (FLAGYL) tablet 500 mg  500 mg Oral Q12H Manuela Schwartz, NP   500 mg at 08/25/22 1112   midodrine (PROAMATINE) tablet 10 mg  10 mg Oral TID WC Marcelino Duster, MD       montelukast (SINGULAIR) tablet 10 mg  10 mg Oral QHS Skip Mayer A, MD   10 mg at 08/24/22 2047   naloxone [facility]) injection 0.4 mg  0.4 mg Intravenous PRN Wendee Beavers, NP       ondansetron (ZOFRAN) tablet 4 mg  4 mg  Oral Q6H PRN Lurline Del, MD       Or   ondansetron [facility]) injection 4 mg  4 mg Intravenous Q6H PRN Skip Mayer A, MD   4 mg at 08/21/22 1514   oxybutynin (DITROPAN) tablet 5 mg  5 mg Oral Q8H PRN Lurline Del, MD       pantoprazole (PROTONIX) EC tablet 40 mg  40 mg Oral Daily Skip Mayer A, MD   40 mg at 08/25/22 1112   pentafluoroprop-tetrafluoroeth (GEBAUERS) aerosol 1 Application  1 Application Topical PRN Wendee Beavers, NP       sevelamer carbonate (RENVELA) powder PACK 2.4 g  2.4 g Oral TID WC Skip Mayer A, MD   2.4 g at 08/24/22 1736    Past Medical History:  Diagnosis Date   (HFpEF) heart failure with preserved ejection fraction (HCC)    a. 11/2014 Echo: EF 50-55%, Gr1 DD; b. 06/2016 Echo: EF 60-65%, no rwma, mod dil LA, nl RV, triv eff.   Anxiety    Asthma    Bronchitis    CHF (congestive  heart failure) (HCC)    Chronic cough    Chronic respiratory failure with hypoxia and hypercapnia (HCC)    a. Home O2.  (2-4L)   CKD (chronic kidney disease), stage III (HCC)    Depression    Diabetes mellitus without complication (HCC)    Diverticulosis    Dyspnea    Dysrhythmia    per pt, no arrythmias   Environmental and seasonal allergies    Extreme obesity with alveolar hypoventilation (HCC)    Hypertension    Iron deficiency anemia    a. chronic blood loss->heavy menses.   Kidney failure    dialysis tues/thur/sat   ... left arm shunt   Orthopnea    Oxygen dependent    3l/min   Pericardial effusion    a. 11/2014 CT Chest: Mod effusion; b. 11/2014 Echo: small to mod circumferential effusion. No hemodynamic compromise.   Pneumonia    history   Poorly controlled diabetes mellitus (HCC)    a. 11/2014 A1c 13.2.   Swelling of both lower extremities     Past Surgical History:  Procedure Laterality Date   A/V FISTULAGRAM Left 06/09/2017   Procedure: A/V FISTULAGRAM;  Surgeon: Annice Needy, MD;  Location: [facility] INVASIVE CV LAB;  Service: Cardiovascular;  Laterality: Left;   A/V FISTULAGRAM Left 06/16/2017   Procedure: A/V FISTULAGRAM;  Surgeon: Annice Needy, MD;  Location: [facility] INVASIVE CV [facility];  Service: Cardiovascular;  Laterality: Left;   A/V FISTULAGRAM Left 01/28/2018   Procedure: A/V FISTULAGRAM;  Surgeon: Annice Needy, MD;  Location: [facility] INVASIVE CV LAB;  Service: Cardiovascular;  Laterality: Left;   AMPUTATION TOE Right 10/21/2020   Procedure: AMPUTATION TOE-5th Digit Amputation Right Foot;  Surgeon: Felecia Shelling, DPM;  Location: [facility] ORS;  Service: Podiatry;  Laterality: Right;   AV FISTULA PLACEMENT Left 04/25/2017   Procedure: ARTERIOVENOUS (AV) FISTULA CREATION;  Surgeon: Annice Needy, MD;  Location: [facility] ORS;  Service: Vascular;  Laterality: Left;   BONE BIOPSY Right 04/27/2021   Procedure: Right 4th metatarsal BONE BIOPSY;  Surgeon: Candelaria Stagers, DPM;  Location: [facility]  ORS;  Service: Podiatry;  Laterality: Right;   CATARACT EXTRACTION W/PHACO Left 01/21/2018   Procedure: CATARACT EXTRACTION PHACO AND INTRAOCULAR LENS PLACEMENT (IOC);  Surgeon: Lockie Mola, MD;  Location: [facility] ORS;  Service: Ophthalmology;  Laterality: Left;  [place] 01:55.5 CDE 9.05 EAUP 13.9 Fluid Pack Lot #  [id number]   CERVICAL CONIZATION W/BX N/A 08/21/2022   Procedure: CERVICAL BIOPSY;  Surgeon: Feliberto Gottron Ihor Austin, MD;  Location: [facility] ORS;  Service: Gynecology;  Laterality: N/A;   CESAREAN SECTION     times 3   DIALYSIS/PERMA CATHETER INSERTION Right 04/04/2017   Procedure: DIALYSIS/PERMA CATHETER INSERTION;  Surgeon: Renford Dills, MD;  Location: [facility] INVASIVE CV LAB;  Service: Cardiovascular;  Laterality: Right;   DIALYSIS/PERMA CATHETER REMOVAL N/A 09/17/2017   Procedure: DIALYSIS/PERMA CATHETER REMOVAL;  Surgeon: Annice Needy, MD;  Location: [facility] CV [facility];  Service: Cardiovascular;  Laterality: N/A;   INCISION AND DRAINAGE Right 10/26/2020   Procedure: INCISION AND DRAINAGE;  Surgeon: Candelaria Stagers, DPM;  Location: [facility] ORS;  Service: Podiatry;  Laterality: Right;   INSERTION OF DIALYSIS CATHETER N/A 04/16/2017   Procedure: INSERTION OF DIALYSIS PERM CATHETER;  Surgeon: Annice Needy, MD;  Location: [facility] ORS;  Service: Vascular;  Laterality: N/A;   LOWER EXTREMITY ANGIOGRAPHY Right 10/18/2020   Procedure: Lower Extremity Angiography;  Surgeon: Annice Needy, MD;  Location: [facility];  Service: Cardiovascular;  Laterality: Right;   LOWER EXTREMITY ANGIOGRAPHY Right 04/17/2022   Procedure: Lower Extremity Angiography;  Surgeon: Annice Needy, MD;  Location: [facility] CV [facility];  Service: Cardiovascular;  Laterality: Right;    Social History Social History   Tobacco Use   Smoking status: Never    Passive exposure: Past   Smokeless tobacco: Never  Vaping Use   Vaping Use: Never used  Substance Use Topics   Alcohol use: Not Currently    Comment: occasional    Drug use: Yes    Types: Marijuana    Family History Family History  Problem Relation Age of Onset   Diabetes Mother    Hypertension Mother    Hypertension Father    Diabetes Father    Breast cancer Maternal Aunt 48   Breast cancer Maternal Aunt 50   Hypertension Other    Diabetes Other     Allergies  Allergen Reactions   Dust Mite Extract Shortness Of Breath and Itching   Mold Extract [Trichophyton] Shortness Of Breath and Itching   Pollen Extract Shortness Of Breath    Itching, shortness of breath, asthma like symptoms   Ozempic (0.25 Or 0.5 Mg-Dose) [Semaglutide(0.25 Or 0.5mg -Dos)] Other (See Comments)    pancreatitis   Sulfa Antibiotics Itching   Feraheme [Ferumoxytol] Itching    Uses benadryl so she can get the medicine     REVIEW OF SYSTEMS (Negative unless checked)  Constitutional: [] Weight loss  [] Fever  [] Chills Cardiac: [] Chest pain   [] Chest pressure   [] Palpitations   [] Shortness of breath when laying flat   [x] Shortness of breath at rest   [] Shortness of breath with exertion. Vascular:  [] Pain in legs with walking   [] Pain in legs at rest   [] Pain in legs when laying flat   [] Claudication   [] Pain in feet when walking  [] Pain in feet at rest  [] Pain in feet when laying flat   [] History of DVT   [] Phlebitis   [] Swelling in legs   [] Varicose veins   [x] Non-healing ulcers Pulmonary:   [] Uses home oxygen   [] Productive cough   [] Hemoptysis   [] Wheeze  [] COPD   [] Asthma Neurologic:  [] Dizziness  [] Blackouts   [] Seizures   [] History of stroke   [] History of TIA  [] Aphasia   [] Temporary blindness   [] Dysphagia   [] Weakness or numbness in arms   [] Weakness or  numbness in legs Musculoskeletal:  [] Arthritis   [] Joint swelling   [] Joint pain   [] Low back pain Hematologic:  [] Easy bruising  [] Easy bleeding   [] Hypercoagulable state   [] Anemic  [] Hepatitis Gastrointestinal:  [] Blood in stool   [] Vomiting blood  [] Gastroesophageal reflux/heartburn   [] Difficulty  swallowing. Genitourinary:  [x] Chronic kidney disease   [] Difficult urination  [] Frequent urination  [] Burning with urination   [] Blood in urine Skin:  [] Rashes   [] Ulcers   [] Wounds Psychological:  [] History of anxiety   []  History of major depression.  Physical Examination  Vitals:   08/25/22 1217 08/25/22 1223 08/25/22 1311 08/25/22 1356  BP: (!) 98/47 (!) 87/67  103/72  Pulse:   73   Resp: 20 16 (!) 26 18  Temp:    (!) 97.2 F (36.2 C)  TempSrc:    Axillary  SpO2:    93%  Weight:    128.3 kg  Height:       Body mass index is 47.8 kg/m. Gen:  Lethargic Pulmonary:  Good air movement, respirations not labored, equal bilaterally.  Cardiac: RRR, normal S1, S2. Vascular: extremities warm, LEFT AVF +thrill/bruit Vessel Right Left  Radial Palpable Palpable  Ulnar    Brachial    Carotid Palpable, without bruit Palpable, without bruit  Aorta Not palpable N/A  Femoral    Popliteal    PT    DP     Gastrointestinal: soft, non-tender/non-distended. No guarding/reflex.  Dermatologic:chronic right foot wound Lymph : No Cervical, Axillary, or Inguinal lymphadenopathy.     CBC Lab Results  Component Value Date   WBC 25.4 (H) 08/25/2022   HGB 13.5 08/25/2022   HCT 41.4 08/25/2022   MCV 91.6 08/25/2022   PLT 272 08/25/2022    BMET    Component Value Date/Time   NA 127 (L) 08/25/2022 0517   NA 135 (L) 11/21/2013 1153   K 5.0 08/25/2022 0517   K 4.1 11/21/2013 1153   CL 91 (L) 08/25/2022 0517   CL 98 11/21/2013 1153   CO2 20 (L) 08/25/2022 0517   CO2 30 11/21/2013 1153   GLUCOSE 95 08/25/2022 0517   GLUCOSE 287 (H) 11/21/2013 1153   BUN 51 (H) 08/25/2022 0517   BUN 16 11/21/2013 1153   CREATININE 5.80 (H) 08/25/2022 0517   CREATININE 0.85 11/21/2013 1153   CALCIUM 7.8 (L) 08/25/2022 0517   CALCIUM 8.7 11/21/2013 1153   GFRNONAA 8 (L) 08/25/2022 0517   GFRNONAA >60 11/21/2013 1153   GFRAA 15 (L) 04/26/2017 0609   GFRAA >60 11/21/2013 1153   Estimated  Creatinine Clearance: 15.4 mL/min (A) (by C-G formula based on SCr of 5.8 mg/dL (H)).  COAG Lab Results  Component Value Date   INR 1.2 04/15/2022   INR 1.3 (H) 10/26/2020   INR 1.2 10/15/2020    Radiology DG Chest Port 1 View  Result Date: 08/21/2022 CLINICAL DATA:  Hypoxia. EXAM: PORTABLE CHEST 1 VIEW COMPARISON:  Chest radiograph dated 08/18/2022. FINDINGS: Shallow inspiration. There is mild cardiomegaly with mild central vascular congestion. There is blunting of the right costophrenic angle which may represent trace effusion. Right lung base atelectasis or infiltrate. No pneumothorax. No acute osseous pathology. IMPRESSION: 1. Mild cardiomegaly with mild central vascular congestion. 2. Right lung base atelectasis or infiltrate. Electronically Signed   By: Elgie Collard M.D.   On: 08/21/2022 17:21   US PELVIS (TRANSABDOMINAL ONLY)  Result Date: 08/19/2022 CLINICAL DATA:  Fibroid uterus EXAM: TRANSABDOMINAL ULTRASOUND OF PELVIS TECHNIQUE: Transabdominal ultrasound  examination of the pelvis was performed including evaluation of the uterus, ovaries, adnexal regions, and pelvic cul-de-sac. COMPARISON:  CT scan 08/18/2018 FINDINGS: Uterus Measurements: 9.1 x 6.6 x 9.5 cm = volume: 294 mL. The uterus is diffusely heterogeneous. Of note the images are limited due to contracted urinary bladder overlapping bowel gas and soft tissue. Endometrium Thickness: Not seen.  Obscured Right ovary Measurements: Not Seen. Left ovary Measurements: Not seen. Other findings:  No free fluid. IMPRESSION: Extremely limited examination. Heterogeneous uterus consistent with multiple areas of calcification. These could be vascular. Underlying mass lesion or fibroid is not excluded. The ovaries and endometrium are also obscured. Overall additional cross-sectional study as clinically directed for further delineation Electronically Signed   By: Karen Kays M.D.   On: 08/19/2022 10:59   US Abdomen Limited RUQ  (LIVER/GB)  Result Date: 08/18/2022 CLINICAL DATA:  Abnormal liver function tests EXAM: ULTRASOUND ABDOMEN LIMITED RIGHT UPPER QUADRANT COMPARISON:  CT abdomen and pelvis dated 08/18/2022 FINDINGS: Gallbladder: No gallstones or wall thickening visualized. No sonographic Murphy sign noted by sonographer. Trace pericholecystic free fluid. Common bile duct: Diameter: 3 mm Liver: No focal lesion identified. Subjective diffuse periportal echogenicity. Portal vein is patent on color Doppler imaging with normal direction of blood flow towards the liver. Other: None. IMPRESSION: 1. Subjective diffuse periportal echogenicity, which is nonspecific but can be seen in the setting of hepatitis. 2. Trace pericholecystic free fluid, nonspecific. No sonographic evidence of acute cholecystitis. Electronically Signed   By: Agustin Cree M.D.   On: 08/18/2022 21:04   X-ray chest PA and lateral  Result Date: 08/18/2022 CLINICAL DATA:  Leukocytosis and shortness of breath. EXAM: CHEST - 2 VIEW COMPARISON:  Radiographs 04/24/2022 FINDINGS: Stable cardiomediastinal silhouette. Low lung volumes. Bibasilar atelectasis or infiltrates. No pleural effusion or pneumothorax. IMPRESSION: Bibasilar atelectasis or infiltrates. Electronically Signed   By: Minerva Fester M.D.   On: 08/18/2022 19:02   CT ABDOMEN PELVIS WO CONTRAST  Result Date: 08/18/2022 CLINICAL DATA:  Right lower quadrant abdominal pain EXAM: CT ABDOMEN AND PELVIS WITHOUT CONTRAST TECHNIQUE: Multidetector CT imaging of the abdomen and pelvis was performed following the standard protocol without IV contrast. RADIATION DOSE REDUCTION: This exam was performed according to the departmental dose-optimization program which includes automated exposure control, adjustment of the mA and/or kV according to patient size and/or use of iterative reconstruction technique. COMPARISON:  CT examination dated April 24, 2021 FINDINGS: Lower chest: Subsegmental atelectasis in the right lower  lobe. No acute abnormality Hepatobiliary: No focal liver abnormality is seen. No gallstones, gallbladder wall thickening, or biliary dilatation. Pancreas: Unremarkable. No pancreatic ductal dilatation or surrounding inflammatory changes. Spleen: Normal in size without focal abnormality. Adrenals/Urinary Tract: Adrenal glands are unremarkable. Kidneys are normal, without renal calculi, focal lesion, or hydronephrosis. Bladder is unremarkable. Stomach/Bowel: Stomach is within normal limits. Appendix appears normal. No evidence of bowel wall thickening, distention, or inflammatory changes. Vascular/Lymphatic: Moderate aortic atherosclerosis. No enlarged abdominal or pelvic lymph nodes. Reproductive: Enlarged uterus with degenerated fibroid with calcification. Bilateral adnexa are unremarkable. Other: No abdominal wall hernia or abnormality. No abdominopelvic ascites. Musculoskeletal: No acute or significant osseous findings. IMPRESSION: 1. No CT evidence of acute abdominal/pelvic process. Normal appendix. 2. Enlarged uterus with degenerated fibroid with calcification. 3. Moderate aortic atherosclerosis. Electronically Signed   By: Larose Hires D.O.   On: 08/18/2022 14:38      Assessment/Plan 1. Sepsis; critically ill 2. Inability to obtain peripheral IV access in a complex Vasculopath; ESRD 3. Will place  a RIGHT internal jugular triple lumen catheter for IV access; discussed procedure with husband. Risks benefits, alternatives explained. Consent obtained.   Bertram Denver, MD  08/25/2022 3:22 PM    This note was created with Dragon medical transcription system.  Any error is purely unintentional

## 2022-08-25 NOTE — Procedures (Signed)
[facility]   PROCEDURE NOTE  PROCEDURE: 1. RIGHT IJ triple lumen central venous catheter placement 2. RIGHT Internal Jugular cannulation under ultrasound guidance  PRE-OPERATIVE DIAGNOSIS: Sepsis; Inability to obtain peripheral access  POST-OPERATIVE DIAGNOSIS: same as above  SURGEON: Triston Lisanti A, MD  ANESTHESIA:  None  ESTIMATED BLOOD LOSS: minimal  FINDING(S): none  SPECIMEN(S):  none  INDICATIONS:   [name] is a 52 y.o. female who presents with need for venous access.  The patient presents for central venous catheter placement.  The patient is aware the risks of central venous catheter placement include but are not limited to: bleeding, infection, central venous injury, pneumothorax, possible venous stenosis, possible malpositioning in the venous system, and possible infections related to long-term catheter presence. The patient was aware of these risks and agreed to proceed.  DESCRIPTION: After written informed consent was obtained from the patient and/or family, the patient was placed supine in the hospital bed.  The patient was prepped with chloraprep and draped in the standard fashion for a chest or neck central venous catheter placement.  I anesthesized the neck cannulation site with 1% lidocaine then under ultrasound guidance, the right Internal jugular  vein was cannulated with the 18 gauge needle and a permanent image was recorded.  A J wire was then placed- there was significant difficulty passing the wire past the skin and soft tissues. The wire was removed, there was good venous return in the syringe. The wire was again passed without difficulty. After a skin nick and dilatation, the triple lumen central venous catheter was placed over the wire and the wire was removed.  Each port was aspirated and flushed with sterile normal saline.  The catheter was secured in placed with three interrupted stitches of 3-0 Silk tied to the catheter.  The  catheter was dressed with sterile dressing.  Stat CXR was performed revealing a course to the Azygous vein. The catheter was pulled back over a wire and redirected. Again the wire passed without difficulty. The catheter was repositioned, with good venous return from all ports and sewn in place. The repeat CXR revealed its tip in the Left subclavian vein.  Probable central venous stenosis with history of multiple catheters. Will consider repositioning under direct fluoroscopy.  COMPLICATIONS: none apparent  CONDITION: stable  Gerard Cantara A 08/25/2022, 4:01 PM     This note was created with Dragon Medical transcription system. Any errors in dictation are purely unintentional.

## 2022-08-25 NOTE — Progress Notes (Signed)
   08/25/22 1211  Assess: MEWS Score  Temp 97.6 F (36.4 C)  BP (!) 55/30  MAP (mmHg) (!) 39  Pulse Rate (!) 121  Resp 18  SpO2 (!) 89 %  O2 Device Nasal Cannula  Assess: MEWS Score  MEWS Temp 0  MEWS Systolic 3  MEWS Pulse 2  MEWS RR 0  MEWS LOC 1  MEWS Score 6  MEWS Score Color Red  Assess: if the MEWS score is Yellow or Red  Were vital signs taken at a resting state? Yes  Focused Assessment No change from prior assessment  Does the patient meet 2 or more of the SIRS criteria? No  MEWS guidelines implemented  Yes, red  Treat  MEWS Interventions Considered administering scheduled or prn medications/treatments as ordered  Take Vital Signs  Increase Vital Sign Frequency  Red: Q1hr x2, continue Q4hrs until patient remains green for 12hrs  Escalate  MEWS: Escalate Red: Discuss with charge nurse and notify provider. Consider notifying RRT. If remains red for 2 hours consider need for higher level of care  Notify: Charge Nurse/RN  Name of Charge Nurse/RN Notified [place], RN  Provider Notification  Provider Name/Title Sreeram  Date Provider Notified 08/25/22  Time Provider Notified 1211  Method of Notification Face-to-face  Notification Reason Other (Comment) (Red MEWS)  Provider response At bedside  Date of Provider Response 08/25/22  Time of Provider Response 1211  Assess: SIRS CRITERIA  SIRS Temperature  0  SIRS Pulse 1  SIRS Respirations  0  SIRS WBC 0  SIRS Score Sum  1   Dr. Clide Dales at bedside. Patient lethargic, arouses to voice but confused. No respiratory distress. Patient has weak pulses upon palpating. Blood pressure rechecked several times with most recent being 87/67. NSR rate in 70's. MD placed transfer orders to ICU.

## 2022-08-25 NOTE — Progress Notes (Signed)
Lactic critical 4.1. MD Sreeram notified by RN

## 2022-08-25 NOTE — Progress Notes (Signed)
Sent Secure chat to Dr. Clide Dales and made MD aware that patient is very drowsy with confusion. MD acknowledged and is aware patient has been refusing to wear BiPAP at night. MD ordered ABG.

## 2022-08-25 NOTE — Consult Note (Signed)
NAME:  [name], MRN:  [id number], DOB:  1970-11-30, LOS: 7 ADMISSION DATE:  08/18/2022  CHIEF COMPLAINT:  shock and MS changes  History of Present Illness:  52 y.o. female with medical history significant of  ESRD on HD   Asthma/COPD/OSA/OSH intolerant to bipap  Chronic respiratory failure on home O2 4L,  HTN, Hfpef, anxiety,  DMII, obesity, chronic wound right foot followed by wound care   presented to ED with interim hx of dysuria as well as foul odor associated with her urine.  Patient has been seen for this as outpatient and was placed on oral antibiotics x 2 weeks and still having symptoms. Patient notes that over the last few days she has had increase lower abdominal pain /pressure with radiation towards her flank area.  UA was noted to be abnormal.  CT scan did not show any acute findings. Labs showed BUN 45 creatinine 5.6 bicarb 22 normal LFTs, leukocytosis at 18.5 A1c 7.8 TSH 2.5 UA abnormal WBC more than 50 leukocyte moderate nitrate negative, urine culture sent and patient admitted on IV antibiotics for failed outpatient treatment.   Urine cultures grew yeast, wet prep positive for yeast, started on Diflucan therapy.  Patient is currently on Rocephin, Flagyl and Doxy for possible pelvic inflammatory disease.  Cervical biopsy PENDING  [name] white count is elevated, she is on and off lethargic, not compliant with BiPAP.  Blood pressures have been on the lower side, unable to tolerate hemodialysis. Transferred to [place] to further evaluation  Patient had received pain medications and became somnolent    Significant Hospital Events: Including procedures, antibiotic start and stop dates in addition to other pertinent events        Micro Data:  +YEAST URINE CULTURE +YEAST WET PREP  Antimicrobials:   Antibiotics Given (last 72 hours)     Date/Time Action Medication Dose Rate   08/22/22 1758 New Bag/Given   metroNIDAZOLE (FLAGYL) IVPB 500 mg 500 mg 100 mL/hr    08/23/22 0004 New Bag/Given   doxycycline (VIBRAMYCIN) 100 mg in sodium chloride 0.9 % 250 mL IVPB 100 mg 125 mL/hr   08/23/22 0933 Given   metroNIDAZOLE (FLAGYL) tablet 500 mg 500 mg    08/23/22 0933 Given   doxycycline (VIBRA-TABS) tablet 100 mg 100 mg    08/23/22 1108 New Bag/Given   cefTRIAXone (ROCEPHIN) 2 g in sodium chloride 0.9 % 100 mL IVPB 2 g 200 mL/hr   08/23/22 2150 Given   doxycycline (VIBRA-TABS) tablet 100 mg 100 mg    08/23/22 2151 Given   metroNIDAZOLE (FLAGYL) tablet 500 mg 500 mg    08/24/22 1321 Given   metroNIDAZOLE (FLAGYL) tablet 500 mg 500 mg    08/24/22 1321 Given   doxycycline (VIBRA-TABS) tablet 100 mg 100 mg    08/24/22 1335 New Bag/Given   cefTRIAXone (ROCEPHIN) 2 g in sodium chloride 0.9 % 100 mL IVPB 2 g 200 mL/hr   08/24/22 2047 Given   doxycycline (VIBRA-TABS) tablet 100 mg 100 mg    08/24/22 2048 Given   metroNIDAZOLE (FLAGYL) tablet 500 mg 500 mg    08/25/22 1112 Given   metroNIDAZOLE (FLAGYL) tablet 500 mg 500 mg    08/25/22 1112 Given   doxycycline (VIBRA-TABS) tablet 100 mg 100 mg       Active Ambulatory Problems    Diagnosis Date Noted   Diabetes mellitus with ESRD (end-stage renal disease) (HCC) 12/15/2014   Hypertensive heart disease with congestive heart failure (HCC) 12/18/2014  Obesity, Class III, BMI 40-49.9 (morbid obesity) (HCC)    Shortness of breath    Hypertension    Iron deficiency anemia 02/17/2015   Insulin-requiring or dependent type II diabetes mellitus (HCC) 03/13/2015   Asthma 08/30/2015   Anxiety 10/02/2015   Chronic obstructive pulmonary disease (HCC) 03/25/2016   Acute on chronic respiratory failure (HCC) 06/26/2016   CKD (chronic kidney disease), stage III (HCC) 06/26/2016   Acute on chronic diastolic heart failure (HCC) 07/04/2016   COPD with chronic bronchitis 07/04/2016   Depression 09/24/2016   Chronic diastolic heart failure (HCC) 01/07/2017   Obesity hypoventilation syndrome (HCC) 03/09/2017    Palliative care encounter    Adjustment disorder with mixed anxiety and depressed mood 03/31/2017   Dysthymia 03/31/2017   Chronic pain syndrome 03/31/2017   ESRD on hemodialysis [facility])    Palliative care by specialist    Goals of care, counseling/discussion    Chronic respiratory failure with hypoxia and hypercapnia (HCC)    Extreme obesity with alveolar hypoventilation (HCC)    Diabetic foot ulcer (HCC) 10/15/2020   Decreased pedal pulses 10/15/2020   Gangrene (HCC)    PVD (peripheral vascular disease) (HCC)    Aspiration into airway    Hearing loss of right ear    Hyponatremia    Anemia of chronic disease    Acute on chronic diastolic CHF (congestive heart failure) (HCC) 11/14/2020   Pancreatitis 04/21/2021   High anion gap metabolic acidosis 04/21/2021   GERD (gastroesophageal reflux disease) 04/21/2021   Acute hematogenous osteomyelitis of right foot (HCC)    History of osteomyelitis    RUQ pain    Status post thoracentesis    SOB (shortness of breath)    Osteomyelitis of right foot (HCC) 04/15/2022   (HFpEF) heart failure with preserved ejection fraction (HCC) 04/15/2022   Right foot infection 04/19/2022   Insomnia 04/24/2022   Chronic low back pain 04/24/2022   Allergic rhinitis due to pollen 05/22/2022   Resolved Ambulatory Problems    Diagnosis Date Noted   Pericardial effusion 12/15/2014   SOB (shortness of breath) 12/15/2014   Accelerated hypertension with diastolic congestive heart failure, NYHA class 3 (HCC) 12/15/2014   Acute diastolic heart failure (HCC) 12/17/2014   Elevated troponin 12/18/2014   Acute on chronic diastolic CHF (congestive heart failure) (HCC) 12/31/2014   Hypoxia 08/09/2015   Acute exacerbation of CHF (congestive heart failure) (HCC) 11/07/2016   Pneumonia 01/07/2017   CHF (congestive heart failure) (HCC) 03/03/2017   Past Medical History:  Diagnosis Date   Bronchitis    Chronic cough    Diabetes mellitus without complication (HCC)     Diverticulosis    Dyspnea    Dysrhythmia    Environmental and seasonal allergies    Kidney failure    Orthopnea    Oxygen dependent    Poorly controlled diabetes mellitus (HCC)    Swelling of both lower extremities       Objective   Blood pressure 103/72, pulse 73, temperature (!) 97.2 F (36.2 C), temperature source Axillary, resp. rate 18, height 5' 4.5" (1.638 m), weight 128.3 kg, last menstrual period 10/14/2012, SpO2 93 %.    FiO2 (%):  [100 %] 100 % PEEP:  [8 cmH20] 8 cmH20 Pressure Support:  [12 cmH20] 12 cmH20   Intake/Output Summary (Last 24 hours) at 08/25/2022 1608 Last data filed at 08/25/2022 0500 Gross per 24 hour  Intake 350 ml  Output 0 ml  Net 350 ml   [employer]  08/24/22 1231 08/25/22 0900 08/25/22 1356  Weight: 124.8 kg 115.1 kg 128.3 kg     REVIEW OF SYSTEMS LIMITED  AROUSABLE BUT LETHARGIC   PHYSICAL EXAMINATION:  GENERAL:critically ill appearing,morbidly obese EYES: Pupils equal, round, reactive to light.  No scleral icterus.  MOUTH: Moist mucosal membrane.  NECK: Supple.  PULMONARY: Lungs clear to auscultation, +rhonchi,  CARDIOVASCULAR: S1 and S2.  Regular rate and rhythm GASTROINTESTINAL: Soft, nontender, -distended. Positive bowel sounds.  MUSCULOSKELETAL:  edema.  NEUROLOGIC:lethargic SKIN:normal, warm to touch, Capillary refill delayed  Pulses present bilaterally   Labs/imaging that I havepersonally reviewed  (right click and "Reselect all SmartList Selections" daily)     ASSESSMENT AND PLAN SYNOPSIS  52 yo morbidly obese AAF with end stage kidney disease on HD with end stage chronic  systolic and diastolic  heart failure with acute on  chronic hypoxic resp failure     Acute Metabolic encephalopathy In the setting of infection, co2 narcosis, kidney disease. Pain meds Fall, aspiration precautions. Encourage out of bed, incentive spirometry. BiPAP at night  Acute on chronic hypoxic resp failure due to chronic  systolic heart failure Prognosis is poor Continue oxygen as needed biPAP at night   CARDIAC FAILURE-acute combined systolic/diastolic dysfunction -oxygen as needed BiPAP support   CARDIAC ICU monitoring   ESRD  -continue Foley Catheter-assess need -Avoid nephrotoxic agents -Follow urine output, BMP -Ensure adequate renal perfusion, optimize oxygenation -Renal dose medications HD as needed   Intake/Output Summary (Last 24 hours) at 08/25/2022 1608 Last data filed at 08/25/2022 0500 Gross per 24 hour  Intake 350 ml  Output 0 ml  Net 350 ml     NEUROLOGY Acute  metabolic encephalopathy Avoid sedatives   INFECTIOUS DISEASE -continue antibiotics as prescribed -follow up cultures  ENDO - ICU hypoglycemic\Hyperglycemia protocol -check FSBS per protocol   GI GI PROPHYLAXIS as indicated  NUTRITIONAL STATUS DIET--> as tolerated Constipation protocol as indicated   ELECTROLYTES -follow labs as needed -replace as needed -pharmacy consultation and following      Best practice (right click and "Reselect all SmartList Selections" daily)  Diet: as tolerated Central venous access:  Yes, and it is still needed Mobility:  OOB  Code Status:  FULL Disposition:ICU  Labs   CBC: Recent Labs  Lab 08/21/22 0503 08/22/22 0507 08/23/22 0640 08/24/22 0605 08/25/22 0517  WBC 23.8* 25.0* 21.7* 19.4* 25.4*  HGB 14.0 14.1 13.5 13.5 13.5  HCT 44.6 45.1 42.0 41.8 41.4  MCV 96.1 96.2 93.1 91.1 91.6  PLT 325 345 300 298 272    Basic Metabolic Panel: Recent Labs  Lab 08/22/22 0507 08/23/22 0640 08/24/22 0605 08/24/22 1849 08/25/22 0517  NA 127* 133* 127* 128* 127*  K 5.6* 3.9 5.6* 5.1 5.0  CL 90* 101 91* 91* 91*  CO2 19* 18* 20* 15* 20*  GLUCOSE 87 67* 66* 82 95  BUN 65* 37* 53* 48* 51*  CREATININE 6.40* 3.74* 5.73* 5.42* 5.80*  CALCIUM 7.7* 6.3* 7.8* 7.8* 7.8*  PHOS  --   --   --   --  7.1*   GFR: Estimated Creatinine Clearance: 15.4 mL/min (A) (by  C-G formula based on SCr of 5.8 mg/dL (H)). Recent Labs  Lab 08/22/22 0507 08/23/22 0640 08/24/22 0605 08/25/22 0517 08/25/22 1259  WBC 25.0* 21.7* 19.4* 25.4*  --   LATICACIDVEN  --   --   --   --  4.1*    Liver Function Tests: Recent Labs  Lab 08/21/22 0503 08/22/22 0507 08/23/22  1610 08/24/22 0605 08/24/22 1849 08/25/22 0517  AST 128* 220* 279* 190* 173*  --   ALT 50* 93* 97* 64* 57*  --   ALKPHOS 594* 650* 648* 626* 597*  --   BILITOT 2.5* 3.2* 2.8* 2.0* 2.0*  --   PROT 7.6 7.4 6.9 6.9 7.1  --   ALBUMIN 3.4* 3.2* 3.0* 3.1* 3.4* 3.1*   No results for input(s): "LIPASE", "AMYLASE" in the last 168 hours. No results for input(s): "AMMONIA" in the last 168 hours.  ABG    Component Value Date/Time   PHART 7.13 (LL) 08/21/2022 1615   PCO2ART 62 (H) 08/21/2022 1615   PO2ART 127 (H) 08/21/2022 1615   HCO3 23.2 08/25/2022 0944   TCO2 23 10/26/2020 1619   ACIDBASEDEF 5.5 (H) 08/25/2022 0944   O2SAT 86.6 08/25/2022 0944     Coagulation Profile: No results for input(s): "INR", "PROTIME" in the last 168 hours.  Cardiac Enzymes: No results for input(s): "CKTOTAL", "CKMB", "CKMBINDEX", "TROPONINI" in the last 168 hours.  HbA1C: Hgb A1c MFr Bld  Date/Time Value Ref Range Status  08/18/2022 12:11 PM 7.8 (H) 4.8 - 5.6 % Final    Comment:    (NOTE) Pre diabetes:          5.7%-6.4%  Diabetes:              >6.4%  Glycemic control for   <7.0% adults with diabetes   04/16/2022 02:15 AM 7.3 (H) 4.8 - 5.6 % Final    Comment:    (NOTE) Pre diabetes:          5.7%-6.4%  Diabetes:              >6.4%  Glycemic control for   <7.0% adults with diabetes     CBG: Recent Labs  Lab 08/25/22 0106 08/25/22 0353 08/25/22 0813 08/25/22 1201 08/25/22 1355  GLUCAP 93 89 90 79 76     Past Medical History:  She,  has a past medical history of (HFpEF) heart failure with preserved ejection fraction (HCC), Anxiety, Asthma, Bronchitis, CHF (congestive heart failure) (HCC),  Chronic cough, Chronic respiratory failure with hypoxia and hypercapnia (HCC), CKD (chronic kidney disease), stage III (HCC), Depression, Diabetes mellitus without complication (HCC), Diverticulosis, Dyspnea, Dysrhythmia, Environmental and seasonal allergies, Extreme obesity with alveolar hypoventilation (HCC), Hypertension, Iron deficiency anemia, Kidney failure, Orthopnea, Oxygen dependent, Pericardial effusion, Pneumonia, Poorly controlled diabetes mellitus (HCC), and Swelling of both lower extremities.   Surgical History:   Past Surgical History:  Procedure Laterality Date   A/V FISTULAGRAM Left 06/09/2017   Procedure: A/V FISTULAGRAM;  Surgeon: Annice Needy, MD;  Location: [facility];  Service: Cardiovascular;  Laterality: Left;   A/V FISTULAGRAM Left 06/16/2017   Procedure: A/V FISTULAGRAM;  Surgeon: Annice Needy, MD;  Location: [facility];  Service: Cardiovascular;  Laterality: Left;   A/V FISTULAGRAM Left 01/28/2018   Procedure: A/V FISTULAGRAM;  Surgeon: Annice Needy, MD;  Location: [facility];  Service: Cardiovascular;  Laterality: Left;   AMPUTATION TOE Right 10/21/2020   Procedure: AMPUTATION TOE-5th Digit Amputation Right Foot;  Surgeon: Felecia Shelling, DPM;  Location: [facility];  Service: Podiatry;  Laterality: Right;   AV FISTULA PLACEMENT Left 04/25/2017   Procedure: ARTERIOVENOUS (AV) FISTULA CREATION;  Surgeon: Annice Needy, MD;  Location: [facility];  Service: Vascular;  Laterality: Left;   BONE BIOPSY Right 04/27/2021   Procedure: Right 4th metatarsal BONE BIOPSY;  Surgeon: Candelaria Stagers,  DPM;  Location: [facility];  Service: Podiatry;  Laterality: Right;   CATARACT EXTRACTION W/PHACO Left 01/21/2018   Procedure: CATARACT EXTRACTION PHACO AND INTRAOCULAR LENS PLACEMENT (IOC);  Surgeon: Lockie Mola, MD;  Location: [facility];  Service: Ophthalmology;  Laterality: Left;  [place] 01:55.5 CDE 9.05 EAUP 13.9 Fluid Pack Lot # [id number]   CERVICAL CONIZATION  W/BX N/A 08/21/2022   Procedure: CERVICAL BIOPSY;  Surgeon: Feliberto Gottron Ihor Austin, MD;  Location: [facility];  Service: Gynecology;  Laterality: N/A;   CESAREAN SECTION     times 3   DIALYSIS/PERMA CATHETER INSERTION Right 04/04/2017   Procedure: DIALYSIS/PERMA CATHETER INSERTION;  Surgeon: Renford Dills, MD;  Location: [facility];  Service: Cardiovascular;  Laterality: Right;   DIALYSIS/PERMA CATHETER REMOVAL N/A 09/17/2017   Procedure: DIALYSIS/PERMA CATHETER REMOVAL;  Surgeon: Annice Needy, MD;  Location: [facility];  Service: Cardiovascular;  Laterality: N/A;   INCISION AND DRAINAGE Right 10/26/2020   Procedure: INCISION AND DRAINAGE;  Surgeon: Candelaria Stagers, DPM;  Location: [facility];  Service: Podiatry;  Laterality: Right;   INSERTION OF DIALYSIS CATHETER N/A 04/16/2017   Procedure: INSERTION OF DIALYSIS PERM CATHETER;  Surgeon: Annice Needy, MD;  Location: [facility];  Service: Vascular;  Laterality: N/A;   LOWER EXTREMITY ANGIOGRAPHY Right 10/18/2020   Procedure: Lower Extremity Angiography;  Surgeon: Annice Needy, MD;  Location: [facility];  Service: Cardiovascular;  Laterality: Right;   LOWER EXTREMITY ANGIOGRAPHY Right 04/17/2022   Procedure: Lower Extremity Angiography;  Surgeon: Annice Needy, MD;  Location: [facility];  Service: Cardiovascular;  Laterality: Right;     Social History:   reports that she has never smoked. She has been exposed to tobacco smoke. She has never used smokeless tobacco. She reports that she does not currently use alcohol. She reports current drug use. Drug: Marijuana.   Family History:  Her family history includes Breast cancer (age of onset: 36) in her maternal aunt and maternal aunt; Diabetes in her father, mother, and another family member; Hypertension in her father, mother, and another family member.   Allergies Allergies  Allergen Reactions   Dust Mite Extract Shortness Of Breath and Itching   Mold Extract  [Trichophyton] Shortness Of Breath and Itching   Pollen Extract Shortness Of Breath    Itching, shortness of breath, asthma like symptoms   Ozempic (0.25 Or 0.5 Mg-Dose) [Semaglutide(0.25 Or 0.5mg -Dos)] Other (See Comments)    pancreatitis   Sulfa Antibiotics Itching   Feraheme [Ferumoxytol] Itching    Uses benadryl so she can get the medicine     Home Medications  Prior to Admission medications   Medication Sig Start Date End Date Taking? Authorizing Provider  acetaminophen (TYLENOL) 500 MG tablet Take 1,000 mg by mouth every 6 (six) hours as needed for mild pain, fever or headache.   Yes [provider]  albuterol (PROVENTIL) (2.5 MG/3ML) 0.083% nebulizer solution Take 3 mLs (2.5 mg total) by nebulization every 6 (six) hours as needed for wheezing or shortness of breath. 02/15/22  Yes Salena Saner, MD  albuterol (VENTOLIN HFA) 108 (90 Base) MCG/ACT inhaler Inhale 2 puffs into the lungs every 6 (six) hours as needed for wheezing or shortness of breath. 02/15/22  Yes Salena Saner, MD  atorvastatin (LIPITOR) 40 MG tablet Take 1 tablet (40 mg total) by mouth daily. 04/20/22  Yes Regalado, Belkys A, MD  benzonatate (TESSALON PERLES) 100 MG capsule Take 1 capsule (100 mg  total) by mouth 3 (three) times daily as needed for cough. 05/22/22 05/22/23 Yes Boswell, Chelsa, NP  budesonide (PULMICORT) 0.5 MG/2ML nebulizer solution USE 1 VIAL  IN  NEBULIZER TWICE  DAILY - (Rinse Mouth After Each Treatment) 08/13/22  Yes Orson Eva, NP  ciprofloxacin (CIPRO) 500 MG tablet Take 500 mg by mouth daily. 08/13/22  Yes [provider]  clindamycin (CLEOCIN T) 1 % lotion APPLY TO FACE, ARMS AND LEGS EVENLY NIGHTLY AT BEDTIME 06/27/22  Yes Boswell, Chelsa, NP  escitalopram (LEXAPRO) 10 MG tablet Take 1 tablet (10 mg total) by mouth daily. 04/24/22 04/24/23 Yes Boswell, Gareth Morgan, NP  formoterol (PERFOROMIST) 20 MCG/2ML nebulizer solution USE 1 VIAL  IN  NEBULIZER TWICE  DAILY - (Morning And  Evening) 08/13/22  Yes Boswell, Chelsa, NP  gentamicin cream (GARAMYCIN) 0.1 % Apply 1 Application topically at bedtime. 06/27/22  Yes [provider]  insulin degludec (TRESIBA) 100 UNIT/ML FlexTouch Pen Inject 20 Units into the skin daily. 04/20/22  Yes Regalado, Belkys A, MD  lidocaine-prilocaine (EMLA) cream Apply topically as needed (For access for dialysis).   Yes [provider]  LINZESS 290 MCG CAPS capsule TAKE 1 CAPSULE BY MOUTH IN THE MORNING WITH A GLASS OF WATER 30 MINUTES BEFORE BREAKFAST 08/08/22  Yes Miki Kins, FNP  methocarbamol (ROBAXIN) 750 MG tablet Take 750 mg by mouth every 8 (eight) hours as needed for muscle spasms.   Yes [provider]  midodrine (PROAMATINE) 10 MG tablet Take 20 mg by mouth daily as needed (for low blood pressure on dialysis days). 08/23/21  Yes [provider]  ondansetron (ZOFRAN) 4 MG tablet Take 4 mg by mouth 2 (two) times daily as needed for nausea. 07/02/22  Yes [provider]  pregabalin (LYRICA) 50 MG capsule TAKE 1 CAPSULE BY MOUTH EVERY 8 HOURS AS NEEDED FOR  NEUROPATHY 07/01/22  Yes Orson Eva, NP  revefenacin (YUPELRI) 175 MCG/3ML nebulizer solution  01/19/21  Yes [provider]  sevelamer (RENAGEL) 800 MG tablet Take 800-2,400 mg by mouth 4 (four) times daily. Three tablets three times daily with meals and 1 tablet with snacks once daily   Yes [provider]  torsemide (DEMADEX) 100 MG tablet Take 100 mg by mouth daily. 05/21/22  Yes [provider]  aspirin EC 81 MG tablet Take 81 mg by mouth daily.  Patient not taking: Reported on 08/19/2022    [provider]  cloNIDine (CATAPRES) 0.1 MG tablet Take 0.1 mg by mouth 2 (two) times daily. Patient not taking: Reported on 08/19/2022 03/21/22   [provider]  doxepin (SINEQUAN) 10 MG capsule Take 1 capsule (10 mg total) by mouth at bedtime. Patient not taking: Reported on 08/19/2022 04/24/22   Orson Eva, NP  ferric citrate (AURYXIA) 1 GM 210 MG(Fe) tablet Take 210 mg by mouth 3 (three) times daily with meals. Only taking once a day most days Patient not taking: Reported on 08/19/2022    [provider]  insulin lispro (HUMALOG) 100 UNIT/ML KwikPen Inject into the skin. Patient not taking: Reported on 08/19/2022 06/12/21   [provider]  lactulose (CHRONULAC) 10 GM/15ML solution Take 30 mLs (20 g total) by mouth daily as needed for severe constipation. Patient not taking: Reported on 08/19/2022 04/26/17   Katha Hamming, MD  montelukast (SINGULAIR) 10 MG tablet Take 1 tablet (10 mg total) by mouth at bedtime. Patient not taking: Reported on 08/19/2022 09/17/21   Salena Saner, MD  Omeprazole  Magnesium (PRILOSEC PO)     [provider]  OXYGEN Inhale 2-4 L into the lungs.    [provider]  predniSONE (DELTASONE) 50 MG tablet Take 1 tablet by mouth daily in AM with food x 5 days Patient not taking: Reported on 08/19/2022 04/24/22   Orson Eva, NP  RENVELA 2.4 g PACK Take 2.4 g by mouth 3 (three) times daily. Patient not taking: Reported on 08/19/2022 08/07/21   [provider]  sucroferric oxyhydroxide (VELPHORO) 500 MG chewable tablet Chew 500 mg by mouth 3 (three) times daily with meals.    [provider]  Vitamin D, Cholecalciferol, 25 MCG (1000 UT) TABS Take 1,000 Units by mouth daily. Patient not taking: Reported on 08/19/2022    [provider]       Critical Care Time devoted to patient care services described in this note is 60 minutes.    Patient is critically ill. Patient with Multiorgan failure and at high risk for cardiac arrest and death.    Lucie Leather, M.D.  [facility]  Medical Director [facility] Medical Director [facility] Cardio-Pulmonary Department

## 2022-08-25 NOTE — Progress Notes (Signed)
Progress Note   Patient: [name] XLK:[id number] DOB: 1970/08/10 DOA: 08/18/2022     7 DOS: the patient was seen and examined on 08/25/2022   Brief hospital course: Nallely Fadley. [place] is a 52 y.o. female with medical history significant of  ESRD on HD TTS  with minimal urine out put, Asthma/COPD/OSA/OSH intolerant to bipap, Chronic respiratory failure on home O2 4L,  HTN, Hfpef, anxiety,  DMII, obesity, chronic wound right foot followed by wound care presented to ED with interim hx of dysuria as well as foul odor associated with her urine.Patient has been seen for this as outpatient and was placed on oral antibiotics x 2 weeks and still having symptoms. Patient notes that over the last few days she has had increase lower abdominal pain /pressure with radiation towards her flank area.  UA was noted to be abnormal.  CT scan did not show any acute findings. Labs showed BUN 45 creatinine 5.6 bicarb 22 normal LFTs, leukocytosis at 18.5 A1c 7.8 TSH 2.5 UA abnormal WBC more than 50 leukocyte moderate nitrate negative, urine culture sent and patient admitted on IV antibiotics for failed outpatient treatment.  Urine cultures grew yeast, wet prep positive for yeast, started on Diflucan therapy.  Patient is currently on Rocephin, Flagyl and Doxy for possible pelvic inflammatory disease.  Cervical biopsy negative for malignancy.  [name] white count is elevated, she is on and off lethargic, not compliant with BiPAP.  Blood pressures have been on the lower side, unable to tolerate hemodialysis.  Assessment and Plan: Yeast UTI Vaginal discharge Wet prep done positive for yeast- Continue fluconazole therapy. Continue Rocephin, flagyl, doxy for possible PID.  Enlarged uterus with degenerating fibroid Vaginal discharge- CT scan suggesting enlarged uterus secondary to fibroid.   Continue Flagyl and doxycycline therapy for possible PID.  Cervical biopsy negative for malignancy.  Outpatient oncology  follow-up  Metabolic encephalopathy In the setting of infection, co2 narcosis, kidney disease. Today she is more lethargic, though intermittently answers. Check ABG, lactic acid. Neuro checks. Fall, aspiration precautions. Encourage out of bed, incentive spirometry.  Hyponatremia- ESRD on hemodialysis- Nephrology on board for dialysis needs.  Hypotension- caution with antihypertensives. Will transfer to ICU for close monitoring. Critical care consult will be obtained. ABG, lactic acid, blood cultures ordered.  Elevated LFT-  Alk phos chronically elevated, AST, ALT trending down. No sonographic evidence of cholecystitis. Trend LFT, avoid hepatotoxic drugs.  Chronic right foot wound- Prior MRI reviewed with underlying subacute to chronic osteomyelitis of first through fourth metatarsal heads. Seen by wound care.  Continue dressing changes. Outpatient wound care.  Acute on chronic respiratory failure with hypoxia and hypercapnea Obesity hypoventilation syndrome- High CO2 level noted on ABG likely contributing to lethargy. Repeat ABG ordered.  Transfer to ICU for close respiratory and neurological monitoring.  Anxiety disorder-continue Lexapro.  Chronic hypotension-BP lower side, midodrine changed to 10mg  TID scheduled.  Chronic diastolic CHF-euvolemic at this time, fluid management per dialysis.  Type 2 diabetes mellitus- A1c 7.8.  eating poor, low sugars noted. Hold long-acting insulin for low sugars, continue sliding scale coverage for now.  Morbid obesity with BMI 44.2- Diet, exercise and weight loss advised.  DVT prophylaxis- Heparin. CODE STATUS - FULL.      Subjective: Patient is seen and examined today morning after dialysis. Low BP with HD noted. Sleepy but arousable. Advised to be complaint with Bipap.  Physical Exam: Vitals:   08/25/22 0900 08/25/22 1211 08/25/22 1217 08/25/22 1223  BP:  (!) 55/30 [name])  98/47 (!) 87/67  Pulse:  (!) 121    Resp:  18 20  16   Temp:  97.6 F (36.4 C)    TempSrc:      SpO2:  (!) 89%    Weight: 115.1 kg     Height:       General - Elderly morbidly obese African-American female, sleepy and lethargic. HEENT - PERRLA, EOMI, atraumatic head, non tender sinuses. Lung - Clear, diffuse Rales and rhonchi. Heart - S1, S2 heard, no murmurs, rubs, 2+ pedal edema. Abdomen soft, obese, bowel sounds good Neuro - sleepy and lethargic, able to answer but goes to sleep, non focal exam. Skin - Warm and dry. Data Reviewed:  CBC, CMP, VBG, blood sugars  Family Communication: unable to reach patient's husband over phone.  Disposition: Status is: Inpatient Remains inpatient appropriate because: IV antifungal, antibiotic therapy, close respiratory, neurological, hemodynamic status.  Planned Discharge Destination: unknown at this time.    MDM level 3- She is sleepy, lethargic, has low BP, no IV access, not able to tolerate HD. Very high risk for sudden clinical deterioration. Will transfer her to ICU for close monitoring.  Author: Marcelino Duster, MD 08/25/2022 12:32 PM  For on call review [email or web address].

## 2022-08-25 NOTE — Progress Notes (Signed)
Tried to place patient back on bipap. Patient wore for 20 mins and then wanted to take it off. Placed patient back on 10L HFNC.

## 2022-08-25 NOTE — Progress Notes (Signed)
Patient transferred by bed to ICU room 9 with this RN. Patient drowsy but talking when arriving to new room. Sheria Lang, RN at bedside. [name] daughter aware of transfer and called patient's husband to make him aware. Daughter in waiting room.

## 2022-08-25 NOTE — Progress Notes (Signed)
[facility]  ROUNDING NOTE   Subjective:   [name] is a 52 year old African-American female with a past medical history of end-stage renal disease, patient is on Tuesday Thursday Saturday schedule, diabetes mellitus, history of pancreatitis who came to the ER with chief complaint of abdominal pain with foul smelling urine for 2 weeks.  Patient is known to our clinic and receives outpatient dialysis treatments at [facility] on a TTS schedule, supervised by Dr Thedore Mins.   Patient seen sitting up in bed Alert and oriented but moments of confusion Dozes off during conversation.   Objective:  Vital signs in last 24 hours:  Temp:  [97.6 F (36.4 C)-98.5 F (36.9 C)] 97.6 F (36.4 C) (06/30 1211) Pulse Rate:  [70-121] 73 (06/30 1311) Resp:  [16-26] 26 (06/30 1311) BP: (55-139)/(30-89) 87/67 (06/30 1223) SpO2:  [89 %-95 %] 89 % (06/30 1211) FiO2 (%):  [100 %] 100 % (06/30 1311) Weight:  [115.1 kg] 115.1 kg (06/30 0900)  Weight change:  Filed Weights   08/24/22 0900 08/24/22 1231 08/25/22 0900  Weight: 123.4 kg 124.8 kg 115.1 kg    Intake/Output: I/O last 3 completed shifts: In: 500 [P.O.:400; IV Piggyback:100] Out: -0.7    Intake/Output this shift:  No intake/output data recorded.  Physical Exam: General: NAD  Head: Normocephalic, atraumatic. Moist oral mucosal membranes  Eyes: Anicteric  Lungs:  Clear to auscultation, NCO2  Heart: Regular rate and rhythm  Abdomen:  Soft, nontender, obese  Extremities:  No peripheral edema.  Neurologic: , moving all four extremities  Skin: Chronic right foot wound  Access: Lt AVF     Basic Metabolic Panel: Recent Labs  Lab 08/22/22 0507 08/23/22 0640 08/24/22 0605 08/24/22 1849 08/25/22 0517  NA 127* 133* 127* 128* 127*  K 5.6* 3.9 5.6* 5.1 5.0  CL 90* 101 91* 91* 91*  CO2 19* 18* 20* 15* 20*  GLUCOSE 87 67* 66* 82 95  BUN 65* 37* 53* 48* 51*  CREATININE 6.40* 3.74* 5.73* 5.42* 5.80*  CALCIUM 7.7*  6.3* 7.8* 7.8* 7.8*  PHOS  --   --   --   --  7.1*     Liver Function Tests: Recent Labs  Lab 08/21/22 0503 08/22/22 0507 08/23/22 0640 08/24/22 0605 08/24/22 1849 08/25/22 0517  AST 128* 220* 279* 190* 173*  --   ALT 50* 93* 97* 64* 57*  --   ALKPHOS 594* 650* 648* 626* 597*  --   BILITOT 2.5* 3.2* 2.8* 2.0* 2.0*  --   PROT 7.6 7.4 6.9 6.9 7.1  --   ALBUMIN 3.4* 3.2* 3.0* 3.1* 3.4* 3.1*    No results for input(s): "LIPASE", "AMYLASE" in the last 168 hours. No results for input(s): "AMMONIA" in the last 168 hours.  CBC: Recent Labs  Lab 08/21/22 0503 08/22/22 0507 08/23/22 0640 08/24/22 0605 08/25/22 0517  WBC 23.8* 25.0* 21.7* 19.4* 25.4*  HGB 14.0 14.1 13.5 13.5 13.5  HCT 44.6 45.1 42.0 41.8 41.4  MCV 96.1 96.2 93.1 91.1 91.6  PLT 325 345 300 298 272     Cardiac Enzymes: No results for input(s): "CKTOTAL", "CKMB", "CKMBINDEX", "TROPONINI" in the last 168 hours.  BNP: Invalid input(s): "POCBNP"  CBG: Recent Labs  Lab 08/24/22 2028 08/25/22 0106 08/25/22 0353 08/25/22 0813 08/25/22 1201  GLUCAP 100* 93 89 90 79     Microbiology: Results for orders placed or performed during the hospital encounter of 08/18/22  Urine Culture     Status: Abnormal  Collection Time: 08/18/22  3:07 PM   Specimen: Urine, Clean Catch  Result Value Ref Range Status   Specimen Description   Final    URINE, CLEAN CATCH Performed at [facility], [address], [place], [place] [address]    Special Requests   Final    NONE Performed at [facility], [address] [place], [place] [address]    Culture >=100,000 COLONIES/mL YEAST (A)  Final   Report Status 08/20/2022 FINAL  Final  Wet prep, genital     Status: Abnormal   Collection Time: 08/20/22  6:35 PM  Result Value Ref Range Status   Yeast Wet Prep HPF POC PRESENT (A) NONE SEEN Corrected    Comment: CORRECTED RESULTS CALLED TO: JEFF STRENK 08/22/22 1412 SH CORRECTED ON 06/27 AT 1406:  PREVIOUSLY REPORTED AS NONE SEEN    Trich, Wet Prep NONE SEEN NONE SEEN Final   Clue Cells Wet Prep HPF POC NONE SEEN NONE SEEN Final   WBC, Wet Prep HPF POC >=10 (A) <10 Final   Sperm NONE SEEN  Final    Comment: Performed at [facility], [address] [place], [place] [address]  Chlamydia/NGC rt PCR [place] only)     Status: None   Collection Time: 08/20/22  6:35 PM   Specimen: Cervical/Vaginal swab; Genital  Result Value Ref Range Status   Specimen source GC/Chlam ENDOCERVICAL  Final   Chlamydia Tr NOT DETECTED NOT DETECTED Final   N gonorrhoeae NOT DETECTED NOT DETECTED Final    Comment: (NOTE) This CT/NG assay has not been evaluated in patients with a history of  hysterectomy. Performed at [facility], [address] [place], [place] [address]     Coagulation Studies: No results for input(s): "LABPROT", "INR" in the last 72 hours.  Urinalysis: No results for input(s): "COLORURINE", "LABSPEC", "PHURINE", "GLUCOSEU", "HGBUR", "BILIRUBINUR", "KETONESUR", "PROTEINUR", "UROBILINOGEN", "NITRITE", "LEUKOCYTESUR" in the last 72 hours.  Invalid input(s): "APPERANCEUR"     Imaging: No results found.   Medications:    anticoagulant sodium citrate     cefTRIAXone (ROCEPHIN)  IV 2 g (08/24/22 1335)    arformoterol  15 mcg Nebulization Q12H   aspirin EC  81 mg Oral Daily   budesonide  0.5 mg Nebulization BID   Chlorhexidine Gluconate Cloth  6 each Topical Q0600   cholecalciferol  1,000 Units Oral Daily   doxycycline  100 mg Oral Q12H   escitalopram  10 mg Oral Daily   fluconazole  100 mg Oral Daily   insulin aspart  0-9 Units Subcutaneous Q4H   metroNIDAZOLE  500 mg Oral Q12H   midodrine  10 mg Oral TID WC   montelukast  10 mg Oral QHS   pantoprazole  40 mg Oral Daily   sevelamer carbonate  2.4 g Oral TID WC   acetaminophen, albuterol, alteplase, anticoagulant sodium citrate, bisacodyl, fentaNYL (SUBLIMAZE) injection, heparin, lidocaine (PF),  lidocaine-prilocaine, naLOXone (NARCAN)  injection, ondansetron **OR** ondansetron (ZOFRAN) IV, oxybutynin, pentafluoroprop-tetrafluoroeth  Assessment/ Plan:  [name] is a 52 y.o.  female with a past medical history of end-stage renal disease, patient is on Tuesday Thursday Saturday schedule, diabetes mellitus, history of pancreatitis who came to the ER with chief complaint of abdominal pain with foul smelling urine for 2 weeks.  [facility] TTS Left AVF 127.5kg   End stage renal disease on hemodialysis. Dialysis terminated early due to hypotension. Lokelma given for elevated potassium. Will dialyze patient Monday.   2.  Anemia of chronic kidney disease Lab Results  Component Value Date   HGB 13.5 08/25/2022    Hgb within desired goal.   3. Secondary Hyperparathyroidism: with outpatient labs: PTH 2361, phosphorus 9.0, calcium 8.4 on 08/06/22.   Lab Results  Component Value Date   PTH 134 (H) 04/21/2017   CALCIUM 7.8 (L) 08/25/2022   CAION 0.81 (LL) 10/26/2020   PHOS 7.1 (H) 08/25/2022    Calcium continues to improve. Continue cholecalciferol and sevelamer with meals  4. Diabetes mellitus type II with chronic kidney disease/renal manifestations: noninsulin dependent. Most recent hemoglobin A1c is 7.8 on 08/18/22.  Primary team to manage sliding scale insulin  5. Hyperkalemia, potassium 5.1. Improved with Lokelma.   LOS: 7 Javaria Knapke 6/30/20241:17 PM

## 2022-08-26 ENCOUNTER — Inpatient Hospital Stay: Payer: 59

## 2022-08-26 ENCOUNTER — Encounter: Payer: Self-pay | Admitting: Pulmonary Disease

## 2022-08-26 ENCOUNTER — Encounter: Payer: Self-pay | Admitting: Anesthesiology

## 2022-08-26 ENCOUNTER — Encounter
Admission: EM | Disposition: E | Payer: Self-pay | Source: Home / Self Care | Attending: Student in an Organized Health Care Education/Training Program

## 2022-08-26 ENCOUNTER — Encounter: Payer: Self-pay | Admitting: Internal Medicine

## 2022-08-26 ENCOUNTER — Inpatient Hospital Stay (HOSPITAL_COMMUNITY)
Admit: 2022-08-26 | Discharge: 2022-08-26 | Disposition: A | Discharge status: Home / Self Care | Payer: 59 | Attending: Student in an Organized Health Care Education/Training Program | Admitting: Student in an Organized Health Care Education/Training Program

## 2022-08-26 DIAGNOSIS — T8242XA Displacement of vascular dialysis catheter, initial encounter: Secondary | ICD-10-CM

## 2022-08-26 DIAGNOSIS — I5033 Acute on chronic diastolic (congestive) heart failure: Secondary | ICD-10-CM | POA: Diagnosis not present

## 2022-08-26 DIAGNOSIS — R6521 Severe sepsis with septic shock: Secondary | ICD-10-CM

## 2022-08-26 DIAGNOSIS — I469 Cardiac arrest, cause unspecified: Secondary | ICD-10-CM

## 2022-08-26 DIAGNOSIS — I5032 Chronic diastolic (congestive) heart failure: Secondary | ICD-10-CM

## 2022-08-26 DIAGNOSIS — A419 Sepsis, unspecified organism: Secondary | ICD-10-CM

## 2022-08-26 DIAGNOSIS — G9341 Metabolic encephalopathy: Secondary | ICD-10-CM | POA: Diagnosis not present

## 2022-08-26 DIAGNOSIS — J9622 Acute and chronic respiratory failure with hypercapnia: Secondary | ICD-10-CM

## 2022-08-26 DIAGNOSIS — I739 Peripheral vascular disease, unspecified: Secondary | ICD-10-CM | POA: Diagnosis not present

## 2022-08-26 DIAGNOSIS — N186 End stage renal disease: Secondary | ICD-10-CM | POA: Diagnosis not present

## 2022-08-26 DIAGNOSIS — J9621 Acute and chronic respiratory failure with hypoxia: Secondary | ICD-10-CM | POA: Diagnosis not present

## 2022-08-26 HISTORY — PX: CENTRAL LINE INSERTION: CATH118232

## 2022-08-26 LAB — BLOOD GAS, ARTERIAL
Acid-base deficit: 9.1 mmol/L — ABNORMAL HIGH (ref 0.0–2.0)
Acid-base deficit: 9.3 mmol/L — ABNORMAL HIGH (ref 0.0–2.0)
Acid-base deficit: 6.3 mmol/L — ABNORMAL HIGH (ref 0.0–2.0)
Bicarbonate: 17.4 mmol/L — ABNORMAL LOW (ref 20.0–28.0)
Bicarbonate: 17.6 mmol/L — ABNORMAL LOW (ref 20.0–28.0)
Bicarbonate: 18.4 mmol/L — ABNORMAL LOW (ref 20.0–28.0)
Bicarbonate: 19.7 mmol/L — ABNORMAL LOW (ref 20.0–28.0)
Drawn by: 30136
FIO2: 100 %
FIO2: 100 %
FIO2: 80 %
MECHVT: 530 mL
MECHVT: 530 mL
Mechanical Rate: 18
Mechanical Rate: 18
O2 Saturation: 88.5 %
O2 Saturation: 92 %
O2 Saturation: 99.2 %
PEEP: 5 cmH2O
PEEP: 5 cmH2O
PEEP: 8 cmH2O
Patient temperature: 37
Patient temperature: 37
Patient temperature: 36.7
RATE: 18 resp/min
pCO2 arterial: 56 mmHg — ABNORMAL HIGH (ref 32–48)
pCO2 arterial: 41 mmHg (ref 32–48)
pCO2 arterial: 45 mmHg (ref 32–48)
pCO2 arterial: 39 mmHg (ref 32–48)
pH, Arterial: 7.22 — ABNORMAL LOW (ref 7.35–7.45)
pH, Arterial: 7.24 — ABNORMAL LOW (ref 7.35–7.45)
pH, Arterial: 7.3 — ABNORMAL LOW (ref 7.35–7.45)
pO2, Arterial: 63 mmHg — ABNORMAL LOW (ref 83–108)
pO2, Arterial: 69 mmHg — ABNORMAL LOW (ref 83–108)
pO2, Arterial: 115 mmHg — ABNORMAL HIGH (ref 83–108)

## 2022-08-26 LAB — LACTIC ACID, PLASMA
Lactic Acid, Venous: 6.9 mmol/L (ref 0.5–1.9)
Lactic Acid, Venous: 6.2 mmol/L (ref 0.5–1.9)
Lactic Acid, Venous: 5.2 mmol/L (ref 0.5–1.9)

## 2022-08-26 LAB — CBC
HCT: 42.3 % (ref 36.0–46.0)
Hemoglobin: 13.9 g/dL (ref 12.0–15.0)
MCH: 29.8 pg (ref 26.0–34.0)
MCHC: 32.9 g/dL (ref 30.0–36.0)
MCV: 90.6 fL (ref 80.0–100.0)
Platelets: 267 10*3/uL (ref 150–400)
RBC: 4.67 MIL/uL (ref 3.87–5.11)
RDW: 19.9 % — ABNORMAL HIGH (ref 11.5–15.5)
WBC: 23 10*3/uL — ABNORMAL HIGH (ref 4.0–10.5)
nRBC: 8 % — ABNORMAL HIGH (ref 0.0–0.2)

## 2022-08-26 LAB — CBC WITH DIFFERENTIAL/PLATELET
Abs Immature Granulocytes: 0.87 10*3/uL — ABNORMAL HIGH (ref 0.00–0.07)
Basophils Absolute: 0.1 10*3/uL (ref 0.0–0.1)
Basophils Relative: 1 %
Eosinophils Absolute: 0 10*3/uL (ref 0.0–0.5)
Eosinophils Relative: 0 %
HCT: 42.1 % (ref 36.0–46.0)
Hemoglobin: 14.1 g/dL (ref 12.0–15.0)
Immature Granulocytes: 4 %
Lymphocytes Relative: 2 %
Lymphs Abs: 0.4 10*3/uL — ABNORMAL LOW (ref 0.7–4.0)
MCH: 29.6 pg (ref 26.0–34.0)
MCHC: 33.5 g/dL (ref 30.0–36.0)
MCV: 88.3 fL (ref 80.0–100.0)
Monocytes Absolute: 1.7 10*3/uL — ABNORMAL HIGH (ref 0.1–1.0)
Monocytes Relative: 7 %
Neutro Abs: 21.6 10*3/uL — ABNORMAL HIGH (ref 1.7–7.7)
Neutrophils Relative %: 86 %
Platelets: 284 10*3/uL (ref 150–400)
RBC: 4.77 MIL/uL (ref 3.87–5.11)
RDW: 19.7 % — ABNORMAL HIGH (ref 11.5–15.5)
Smear Review: NORMAL
WBC: 24.7 10*3/uL — ABNORMAL HIGH (ref 4.0–10.5)
nRBC: 9.2 % — ABNORMAL HIGH (ref 0.0–0.2)

## 2022-08-26 LAB — ECHOCARDIOGRAM COMPLETE
AR max vel: 1.75 cm2
AV Area VTI: 1.92 cm2
AV Area mean vel: 1.62 cm2
AV Mean grad: 4.3 mmHg
AV Peak grad: 6.8 mmHg
Ao pk vel: 1.31 m/s
Area-P 1/2: 3.76 cm2
Height: 64.5 in
MV VTI: 0.57 cm2
S' Lateral: 1.6 cm
Weight: 4525.6 oz

## 2022-08-26 LAB — MRSA NEXT GEN BY PCR, NASAL: MRSA by PCR Next Gen: NOT DETECTED

## 2022-08-26 LAB — COMPREHENSIVE METABOLIC PANEL
ALT: 65 U/L — ABNORMAL HIGH (ref 0–44)
ALT: 73 U/L — ABNORMAL HIGH (ref 0–44)
AST: 263 U/L — ABNORMAL HIGH (ref 15–41)
AST: 329 U/L — ABNORMAL HIGH (ref 15–41)
Albumin: 3 g/dL — ABNORMAL LOW (ref 3.5–5.0)
Albumin: 2.9 g/dL — ABNORMAL LOW (ref 3.5–5.0)
Alkaline Phosphatase: 570 U/L — ABNORMAL HIGH (ref 38–126)
Alkaline Phosphatase: 547 U/L — ABNORMAL HIGH (ref 38–126)
Anion gap: 25 — ABNORMAL HIGH (ref 5–15)
Anion gap: 23 — ABNORMAL HIGH (ref 5–15)
BUN: 64 mg/dL — ABNORMAL HIGH (ref 6–20)
BUN: 66 mg/dL — ABNORMAL HIGH (ref 6–20)
CO2: 16 mmol/L — ABNORMAL LOW (ref 22–32)
CO2: 17 mmol/L — ABNORMAL LOW (ref 22–32)
Calcium: 8.4 mg/dL — ABNORMAL LOW (ref 8.9–10.3)
Calcium: 7.7 mg/dL — ABNORMAL LOW (ref 8.9–10.3)
Chloride: 89 mmol/L — ABNORMAL LOW (ref 98–111)
Chloride: 90 mmol/L — ABNORMAL LOW (ref 98–111)
Creatinine, Ser: 6.5 mg/dL — ABNORMAL HIGH (ref 0.44–1.00)
Creatinine, Ser: 6.26 mg/dL — ABNORMAL HIGH (ref 0.44–1.00)
GFR, Estimated: 7 mL/min — ABNORMAL LOW (ref >60)
GFR, Estimated: 8 mL/min — ABNORMAL LOW (ref >60)
Glucose, Bld: 162 mg/dL — ABNORMAL HIGH (ref 70–99)
Glucose, Bld: 221 mg/dL — ABNORMAL HIGH (ref 70–99)
Potassium: 5 mmol/L (ref 3.5–5.1)
Potassium: 5 mmol/L (ref 3.5–5.1)
Sodium: 130 mmol/L — ABNORMAL LOW (ref 135–145)
Sodium: 130 mmol/L — ABNORMAL LOW (ref 135–145)
Total Bilirubin: 2.5 mg/dL — ABNORMAL HIGH (ref 0.3–1.2)
Total Bilirubin: 2.5 mg/dL — ABNORMAL HIGH (ref 0.3–1.2)
Total Protein: 6.5 g/dL (ref 6.5–8.1)
Total Protein: 6.5 g/dL (ref 6.5–8.1)

## 2022-08-26 LAB — RENAL FUNCTION PANEL
Albumin: 3.2 g/dL — ABNORMAL LOW (ref 3.5–5.0)
Anion gap: 21 — ABNORMAL HIGH (ref 5–15)
BUN: 64 mg/dL — ABNORMAL HIGH (ref 6–20)
CO2: 22 mmol/L (ref 22–32)
Calcium: 8.6 mg/dL — ABNORMAL LOW (ref 8.9–10.3)
Chloride: 88 mmol/L — ABNORMAL LOW (ref 98–111)
Creatinine, Ser: 6.23 mg/dL — ABNORMAL HIGH (ref 0.44–1.00)
GFR, Estimated: 8 mL/min — ABNORMAL LOW (ref >60)
Glucose, Bld: 203 mg/dL — ABNORMAL HIGH (ref 70–99)
Phosphorus: 7.6 mg/dL — ABNORMAL HIGH (ref 2.5–4.6)
Potassium: 5.2 mmol/L — ABNORMAL HIGH (ref 3.5–5.1)
Sodium: 131 mmol/L — ABNORMAL LOW (ref 135–145)

## 2022-08-26 LAB — GLUCOSE, CAPILLARY
Glucose-Capillary: 133 mg/dL — ABNORMAL HIGH (ref 70–99)
Glucose-Capillary: 175 mg/dL — ABNORMAL HIGH (ref 70–99)
Glucose-Capillary: 176 mg/dL — ABNORMAL HIGH (ref 70–99)
Glucose-Capillary: 180 mg/dL — ABNORMAL HIGH (ref 70–99)
Glucose-Capillary: 197 mg/dL — ABNORMAL HIGH (ref 70–99)
Glucose-Capillary: 63 mg/dL — ABNORMAL LOW (ref 70–99)

## 2022-08-26 LAB — PROCALCITONIN: Procalcitonin: 3.71 ng/mL

## 2022-08-26 LAB — HEPATITIS PANEL, ACUTE
HCV Ab: NONREACTIVE
Hep A IgM: NONREACTIVE
Hep B C IgM: NONREACTIVE
Hepatitis B Surface Ag: NONREACTIVE

## 2022-08-26 LAB — BRAIN NATRIURETIC PEPTIDE: B Natriuretic Peptide: 1383.3 pg/mL — ABNORMAL HIGH (ref 0.0–100.0)

## 2022-08-26 LAB — MAGNESIUM: Magnesium: 2.7 mg/dL — ABNORMAL HIGH (ref 1.7–2.4)

## 2022-08-26 LAB — TROPONIN I (HIGH SENSITIVITY)
Troponin I (High Sensitivity): 262 ng/L (ref <18)
Troponin I (High Sensitivity): 284 ng/L (ref <18)
Troponin I (High Sensitivity): 584 ng/L (ref <18)

## 2022-08-26 LAB — CULTURE, BLOOD (ROUTINE X 2): Special Requests: ADEQUATE

## 2022-08-26 SURGERY — CENTRAL LINE INSERTION
Anesthesia: LOCAL

## 2022-08-26 MED ORDER — FENTANYL CITRATE PF 50 MCG/ML IJ SOSY
50.0000 ug | PREFILLED_SYRINGE | INTRAMUSCULAR | Status: DC | PRN
  Filled 2022-08-26: qty 2

## 2022-08-26 MED ORDER — METRONIDAZOLE 500 MG PO TABS
500.0000 mg | ORAL_TABLET | Freq: Two times a day (BID) | ORAL | Status: DC
  Administered 2022-08-26: 500 mg
  Filled 2022-08-26 (×2): qty 1

## 2022-08-26 MED ORDER — SEVELAMER CARBONATE 2.4 G PO PACK
2.4000 g | PACK | Freq: Three times a day (TID) | ORAL | Status: DC
  Administered 2022-08-27 – 2022-08-30 (×11): 2.4 g
  Filled 2022-08-26 (×14): qty 1

## 2022-08-26 MED ORDER — NOREPINEPHRINE 16 MG/250ML-% IV SOLN
0.0000 ug/min | INTRAVENOUS | Status: DC
  Administered 2022-08-26: 23 ug/min via INTRAVENOUS
  Administered 2022-08-27: 15 ug/min via INTRAVENOUS
  Administered 2022-08-28: 20 ug/min via INTRAVENOUS
  Administered 2022-08-28: 19 ug/min via INTRAVENOUS
  Administered 2022-08-29: 11 ug/min via INTRAVENOUS
  Administered 2022-08-31: 2 ug/min via INTRAVENOUS
  Filled 2022-08-26 (×7): qty 250

## 2022-08-26 MED ORDER — HEPARIN (PORCINE) IN NACL 1000-0.9 UT/500ML-% IV SOLN
INTRAVENOUS | Status: DC | PRN
  Administered 2022-08-26: 500 mL

## 2022-08-26 MED ORDER — SODIUM CHLORIDE 0.9 % FOR CRRT
INTRAVENOUS_CENTRAL | Status: DC | PRN
  Filled 2022-08-26: qty 1000

## 2022-08-26 MED ORDER — PROPOFOL 1000 MG/100ML IV EMUL
0.0000 ug/kg/min | INTRAVENOUS | Status: DC
  Administered 2022-08-26: 15 ug/kg/min via INTRAVENOUS
  Administered 2022-08-26: 20.005 ug/kg/min via INTRAVENOUS
  Administered 2022-08-26: 25 ug/kg/min via INTRAVENOUS
  Administered 2022-08-28: 10 ug/kg/min via INTRAVENOUS
  Filled 2022-08-26 (×4): qty 100

## 2022-08-26 MED ORDER — FUROSEMIDE 10 MG/ML IJ SOLN
INTRAMUSCULAR | Status: AC
  Administered 2022-08-26: 40 mg
  Filled 2022-08-26: qty 4

## 2022-08-26 MED ORDER — PRISMASOL BGK 0/2.5 32-2.5 MEQ/L EC SOLN
Status: DC
  Filled 2022-08-26: qty 5000

## 2022-08-26 MED ORDER — PROPOFOL 1000 MG/100ML IV EMUL
INTRAVENOUS | Status: AC
  Filled 2022-08-26: qty 100

## 2022-08-26 MED ORDER — VITAL AF 1.2 CAL PO LIQD
1000.0000 mL | ORAL | Status: DC
  Administered 2022-08-26 – 2022-08-31 (×7): 1000 mL

## 2022-08-26 MED ORDER — VANCOMYCIN HCL 1500 MG/300ML IV SOLN: 1500.0000 mg | INTRAVENOUS | Status: DC

## 2022-08-26 MED ORDER — POLYETHYLENE GLYCOL 3350 17 G PO PACK
17.0000 g | PACK | Freq: Every day | ORAL | Status: DC
  Administered 2022-08-27: 17 g
  Filled 2022-08-26 (×2): qty 1

## 2022-08-26 MED ORDER — SODIUM BICARBONATE 8.4 % IV SOLN
INTRAVENOUS | Status: DC
  Filled 2022-08-26 (×3): qty 1000

## 2022-08-26 MED ORDER — ORAL CARE MOUTH RINSE: 15.0000 mL | OROMUCOSAL | Status: DC | PRN

## 2022-08-26 MED ORDER — VASOPRESSIN 20 UNITS/100 ML INFUSION FOR SHOCK
0.0000 [IU]/min | INTRAVENOUS | Status: DC
  Administered 2022-08-26 – 2022-08-27 (×2): 0.03 [IU]/min via INTRAVENOUS
  Administered 2022-08-27: 0.04 [IU]/min via INTRAVENOUS
  Administered 2022-08-27: 0.03 [IU]/min via INTRAVENOUS
  Administered 2022-08-28 – 2022-08-29 (×4): 0.04 [IU]/min via INTRAVENOUS
  Filled 2022-08-26 (×8): qty 100

## 2022-08-26 MED ORDER — ETOMIDATE 2 MG/ML IV SOLN
INTRAVENOUS | Status: AC
  Filled 2022-08-26: qty 20

## 2022-08-26 MED ORDER — FLUCONAZOLE 100 MG PO TABS: 100.0000 mg | ORAL_TABLET | Freq: Every day | ORAL | Status: DC

## 2022-08-26 MED ORDER — DOXYCYCLINE HYCLATE 100 MG PO TABS
100.0000 mg | ORAL_TABLET | Freq: Two times a day (BID) | ORAL | Status: DC
  Administered 2022-08-26: 100 mg

## 2022-08-26 MED ORDER — EPINEPHRINE HCL 5 MG/250ML IV SOLN IN NS
0.5000 ug/min | INTRAVENOUS | Status: DC
  Filled 2022-08-26: qty 250

## 2022-08-26 MED ORDER — FAMOTIDINE 20 MG PO TABS
20.0000 mg | ORAL_TABLET | ORAL | Status: DC
  Administered 2022-08-26 – 2022-08-28 (×2): 20 mg
  Filled 2022-08-26 (×2): qty 1

## 2022-08-26 MED ORDER — FREE WATER
30.0000 mL | Status: DC
  Administered 2022-08-26 – 2022-09-01 (×35): 30 mL

## 2022-08-26 MED ORDER — SODIUM BICARBONATE 8.4 % IV SOLN
INTRAVENOUS | Status: AC
  Filled 2022-08-26: qty 100

## 2022-08-26 MED ORDER — ESCITALOPRAM OXALATE 10 MG PO TABS
10.0000 mg | ORAL_TABLET | Freq: Every day | ORAL | Status: DC
  Administered 2022-08-27 – 2022-08-29 (×3): 10 mg
  Filled 2022-08-26 (×3): qty 1

## 2022-08-26 MED ORDER — VANCOMYCIN HCL 2000 MG/400ML IV SOLN
2000.0000 mg | Freq: Once | INTRAVENOUS | Status: AC
  Administered 2022-08-26: 2000 mg via INTRAVENOUS
  Filled 2022-08-26: qty 400

## 2022-08-26 MED ORDER — DOCUSATE SODIUM 50 MG/5ML PO LIQD
100.0000 mg | Freq: Two times a day (BID) | ORAL | Status: DC
  Administered 2022-08-26 – 2022-08-27 (×4): 100 mg
  Filled 2022-08-26 (×4): qty 10

## 2022-08-26 MED ORDER — SODIUM BICARBONATE 8.4 % IV SOLN
INTRAVENOUS | Status: AC
  Filled 2022-08-26: qty 50

## 2022-08-26 MED ORDER — NOREPINEPHRINE 4 MG/250ML-% IV SOLN
INTRAVENOUS | Status: AC
  Administered 2022-08-26: 4 mg
  Filled 2022-08-26: qty 250

## 2022-08-26 MED ORDER — NOREPINEPHRINE 4 MG/250ML-% IV SOLN
0.0000 ug/min | INTRAVENOUS | Status: DC
  Administered 2022-08-26: 20 ug/min via INTRAVENOUS
  Administered 2022-08-26: 25.013 ug/min via INTRAVENOUS
  Administered 2022-08-26: 28 ug/min via INTRAVENOUS
  Administered 2022-08-26: 8 ug/min via INTRAVENOUS
  Filled 2022-08-26 (×4): qty 250

## 2022-08-26 MED ORDER — ORAL CARE MOUTH RINSE
15.0000 mL | OROMUCOSAL | Status: DC
  Administered 2022-08-26 – 2022-09-01 (×75): 15 mL via OROMUCOSAL

## 2022-08-26 MED ORDER — LIDOCAINE-EPINEPHRINE (PF) 1 %-1:200000 IJ SOLN
INTRAMUSCULAR | Status: DC | PRN
  Administered 2022-08-26: 20 mL
  Administered 2022-08-26 (×2): 10 mL

## 2022-08-26 MED ORDER — MONTELUKAST SODIUM 10 MG PO TABS
10.0000 mg | ORAL_TABLET | Freq: Every day | ORAL | Status: DC
  Administered 2022-08-26 – 2022-08-31 (×6): 10 mg
  Filled 2022-08-26 (×6): qty 1

## 2022-08-26 MED ORDER — FENTANYL CITRATE PF 50 MCG/ML IJ SOSY
50.0000 ug | PREFILLED_SYRINGE | INTRAMUSCULAR | Status: DC | PRN
  Administered 2022-08-27 (×2): 50 ug via INTRAVENOUS

## 2022-08-26 MED ORDER — EPINEPHRINE 1 MG/10ML IJ SOSY
PREFILLED_SYRINGE | INTRAMUSCULAR | Status: AC
  Filled 2022-08-26: qty 30

## 2022-08-26 MED ORDER — HEPARIN SODIUM (PORCINE) 5000 UNIT/ML IJ SOLN
5000.0000 [IU] | Freq: Two times a day (BID) | INTRAMUSCULAR | Status: DC
  Administered 2022-08-26 – 2022-09-01 (×13): 5000 [IU] via SUBCUTANEOUS
  Filled 2022-08-26 (×13): qty 1

## 2022-08-26 MED ORDER — JUVEN PO PACK
1.0000 | PACK | Freq: Two times a day (BID) | ORAL | Status: DC
  Administered 2022-08-27 – 2022-09-01 (×11): 1

## 2022-08-26 MED ORDER — ACETAMINOPHEN 325 MG PO TABS
650.0000 mg | ORAL_TABLET | Freq: Four times a day (QID) | ORAL | Status: DC | PRN
  Administered 2022-08-26: 650 mg
  Filled 2022-08-26: qty 2

## 2022-08-26 MED ORDER — ROCURONIUM BROMIDE 10 MG/ML (PF) SYRINGE
PREFILLED_SYRINGE | INTRAVENOUS | Status: AC
  Filled 2022-08-26: qty 10

## 2022-08-26 MED ORDER — FLUCONAZOLE 100 MG PO TABS
400.0000 mg | ORAL_TABLET | Freq: Every day | ORAL | Status: DC
  Administered 2022-08-27 – 2022-08-28 (×2): 400 mg
  Filled 2022-08-26 (×2): qty 4

## 2022-08-26 MED ORDER — RENA-VITE PO TABS
1.0000 | ORAL_TABLET | Freq: Every day | ORAL | Status: DC
  Administered 2022-08-26 – 2022-08-31 (×6): 1
  Filled 2022-08-26 (×6): qty 1

## 2022-08-26 MED ORDER — HEPARIN SODIUM (PORCINE) 1000 UNIT/ML DIALYSIS
1000.0000 [IU] | INTRAMUSCULAR | Status: DC | PRN
  Administered 2022-08-27 – 2022-08-29 (×3): 3600 [IU] via INTRAVENOUS_CENTRAL
  Filled 2022-08-26: qty 6
  Filled 2022-08-26: qty 4
  Filled 2022-08-26: qty 6
  Filled 2022-08-26: qty 4
  Filled 2022-08-26: qty 6

## 2022-08-26 SURGICAL SUPPLY — 14 items
BIOPATCH RED 1 DISK 7.0 (GAUZE/BANDAGES/DRESSINGS) IMPLANT
BIOPATCH WHT 1IN DISK W/4.0 H (GAUZE/BANDAGES/DRESSINGS) IMPLANT
CLOSURE PERCLOSE PROSTYLE (VASCULAR PRODUCTS) IMPLANT
GUIDEWIRE ADV .018X180CM (WIRE) IMPLANT
KIT CV MULTILUMEN 7FR 20 (SET/KITS/TRAYS/PACK) ×1
KIT CV MULTILUMEN 7FR 20 SUB (SET/KITS/TRAYS/PACK) IMPLANT
KIT DIALYSIS CATH TRI 30X13 (CATHETERS) IMPLANT
PACK ANGIOGRAPHY (CUSTOM PROCEDURE TRAY) IMPLANT
PANNUS RETENTION SYSTEM 2 PAD (MISCELLANEOUS) IMPLANT
SET INTRO CAPELLA COAXIAL (SET/KITS/TRAYS/PACK) IMPLANT
SHEATH BRITE TIP 5FRX11 (SHEATH) IMPLANT
SHEATH PROBE COVER 6X72 (BAG) IMPLANT
TOWEL OR 17X26 4PK STRL BLUE (TOWEL DISPOSABLE) IMPLANT
WIRE GUIDERIGHT .035X150 (WIRE) IMPLANT

## 2022-08-26 NOTE — Progress Notes (Signed)
NAME:  [name], MRN:  [id number], DOB:  01-25-1971, LOS: 8 ADMISSION DATE:  08/18/2022, CHIEF COMPLAINT:  Cardiac arrest, respiratory failure   History of Present Illness:   Patient is a 52 year old female with a past medical history of ESRD (on HD), COPD/Asthma, OSA/OHS, chronic hypoxic respiratory failure, HTN, HFpEF, T2DM, and morbid obesity who presented to the hospital with dysuria, now in the ICU with altered mental status complicated by cardiac arrest.  Patient initially presented to the hospital on 08/18/2022 with dysuria and admitted for the management of UTI. Cultures have grown yeast, and she's received IV antibiotics as well as anti-fungal therapy. During the course of her hospitalization she was evaluated by gynecology and underwent a cervical biopsy. [name] mental status was waxing and waning in the setting of ESRD, acidemia, and hypercapnia. She did not tolerate BiPAP nocturnally. Given worsening, patient was moved to the ICU for monitoring as well as for consideration of CRRT. She had a central line placed by vascular surgery at the bedside yesterday.   Overnight, patient was noted to be unresponsive with the development of bradycardia followed by PEA arrest. She was resuscitated after 15 minutes of CPR and this morning was noted to be on pressors.   Significant Hospital Events: Including procedures, antibiotic start and stop dates in addition to other pertinent events   08/25/2022: admitted to the ICU 08/26/2022: reviewed CXR, noted to be crossing the midline. CVC transduced, arterial waveform noted. Vascular surgery consulted, taken for removal of the CVC with perc removal.  Interim History / Subjective:  Patient sedated this AM. Family updated at the bedside.  Objective   Blood pressure 92/64, pulse 73, temperature 98 F (36.7 C), temperature source Axillary, resp. rate (!) 21, height 5' 4.5" (1.638 m), weight 128.3 kg, last menstrual period 10/14/2012, SpO2 100 %.     Vent Mode: PRVC FiO2 (%):  [60 %-100 %] 90 % Set Rate:  [18 bmp] 18 bmp Vt Set:  [530 mL] 530 mL PEEP:  [5 cmH20-8 cmH20] 8 cmH20 Pressure Support:  [10 cmH20-12 cmH20] 10 cmH20 Plateau Pressure:  [29 cmH20-33 cmH20] 33 cmH20   Intake/Output Summary (Last 24 hours) at 08/26/2022 0846 Last data filed at 08/26/2022 0700 Gross per 24 hour  Intake 963.93 ml  Output --  Net 963.93 ml   Filed Weights   08/24/22 1231 08/25/22 0900 08/25/22 1356  Weight: 124.8 kg 115.1 kg 128.3 kg    Examination: Physical Exam Constitutional:      General: She is not in acute distress.    Appearance: She is obese. She is ill-appearing.  HENT:     Head: Normocephalic.  Cardiovascular:     Rate and Rhythm: Normal rate and regular rhythm.     Pulses: Normal pulses.     Heart sounds: Normal heart sounds.  Pulmonary:     Comments: Ventilated breath sounds bilaterally Abdominal:     General: There is distension.     Palpations: Abdomen is soft.  Neurological:     Mental Status: She is disoriented.     Comments: Patient sedated following cardiac arrest. Pupils equal and sluggish.      Assessment & Plan:   Neurology #Toxic Metabolic Encephalopathy  This is in the setting of cardiac arrest, metabolic acidosis, toxic metabolite accumulation secondary to ESRD, as well as CO2 narcosis. Patient was intubated in the setting of her cardiac arrest, and was initiated on propofol for sedation with PRN's in place for analgesia. Will maintain  normothermia following cardiac arrest.  -Maintain a RASS goal of -1 -Avoid sedating medications as able -Daily wake up assessment -Maintain normothermia -monitor for CVA given CVC in the carotid  Cardiovascular #Cardiac Arrest #Circulatory Shock #HFpEF #Group 2 Pulmonary Hypertension #Inadvertent CVC placement in the carotid  Cardiac arrest was likely secondary to CO2 narcosis and metabolic acidosis, preceded by bradycardia. Blood gas had shown acidosis  peri-arrest. Rise in troponin likely secondary to chest compressions and ACLS, with post arrest EKG not suggestive of ischemia nor arrhythmia. Repeat echocardiogram is ordered and pending.  Developed circulatory shock post cardiac arrest, and is currently on nor-epinephrine and low dose epinephrine. Will await echocardiogram and continue to the cover the patient broadly for possible septic shock as a cause of distributive shock.  Furthermore, patient had a central line placed yesterday in the carotid artery. This was confirmed arterial by waveform and she underwent removal and percutaneous closure with vascular surgery today.  -echocardiogram -trend troponin and lactic acid  Pulmonary #Acute on Chronic Hypoxic Respiratory Failure #OSA/OHS #Asthma/COPD overlap  Intubated in the setting of hypoxic arrest; known history of severe OHS/OSA and asthma/COPD. Currently ventilated with blood gases showing acidemia. Will repeat arterial blood gas and adjust ventilator settings accordingly. Does have increased secretions from the ETT which we will culture.  -Full vent support, implement lung protective strategies -Plateau pressures less than 30 cm H20 -Wean FiO2 & PEEP as tolerated to maintain O2 sats >92% -Follow intermittent Chest X-ray & ABG as needed -Spontaneous Breathing Trials when respiratory parameters met and mental status permits -Implement VAP Bundle -Prn Bronchodilators   Gastrointestinal #Elevated liver enzymes  LFT's are chronically elevated. RUQ ultrasound not showing acute findings. Likely NAFLD. Patient also on PPI for SUP.  Renal #ESRD on HD #Anion Gap Metabolic Acidosis  History of ESRD on HD. Given hemodynamic instability, pressor use, and cardiac arrest, will proceed with CRRT for management of acidosis and volume status. Appreciate input from nephrology.  Endocrine #T2DM  ICU glycemic protocol.  Hem/Onc Will initiate heparin for DVT  prophylaxis  ID #UTI #Aspiration Pneumonia  Patient treated for UTI with Ceftriaxone, and also started on metronidazole and doxycycline. Will maintain on broad spectrum antibiotics for the time being. Low suspicion for PID, however, and will likely consolidate antibiotics to cover for hospital acquired pneumonia and possible osteomyelitis.   Best Practice (right click and "Reselect all SmartList Selections" daily)   Diet/type: tubefeeds DVT prophylaxis: LMWH GI prophylaxis: PPI Lines: Central line and Arterial Line Foley:  Yes, and it is still needed Code Status:  full code Last date of multidisciplinary goals of care discussion [08/26/2022]  Labs   CBC: Recent Labs  Lab 08/22/22 0507 08/23/22 0640 08/24/22 0605 08/25/22 0517 08/26/22 0351  WBC 25.0* 21.7* 19.4* 25.4* 23.0*  HGB 14.1 13.5 13.5 13.5 13.9  HCT 45.1 42.0 41.8 41.4 42.3  MCV 96.2 93.1 91.1 91.6 90.6  PLT 345 300 298 272 267    Basic Metabolic Panel: Recent Labs  Lab 08/23/22 0640 08/24/22 0605 08/24/22 1849 08/25/22 0517 08/26/22 0351  NA 133* 127* 128* 127* 130*  K 3.9 5.6* 5.1 5.0 5.0  CL 101 91* 91* 91* 89*  CO2 18* 20* 15* 20* 16*  GLUCOSE 67* 66* 82 95 162*  BUN 37* 53* 48* 51* 64*  CREATININE 3.74* 5.73* 5.42* 5.80* 6.50*  CALCIUM 6.3* 7.8* 7.8* 7.8* 8.4*  MG  --   --   --   --  2.7*  PHOS  --   --   --  7.1*  --    GFR: Estimated Creatinine Clearance: 13.7 mL/min (A) (by C-G formula based on SCr of 6.5 mg/dL (H)). Recent Labs  Lab 08/23/22 0640 08/24/22 0605 08/25/22 0517 08/25/22 1259 08/25/22 1637 08/26/22 0351 08/26/22 0708  WBC 21.7* 19.4* 25.4*  --   --  23.0*  --   LATICACIDVEN  --   --   --  4.1* 3.8* 6.9* 6.2*    Liver Function Tests: Recent Labs  Lab 08/22/22 0507 08/23/22 0640 08/24/22 0605 08/24/22 1849 08/25/22 0517 08/26/22 0351  AST 220* 279* 190* 173*  --  263*  ALT 93* 97* 64* 57*  --  65*  ALKPHOS 650* 648* 626* 597*  --  570*  BILITOT 3.2* 2.8* 2.0*  2.0*  --  2.5*  PROT 7.4 6.9 6.9 7.1  --  6.5  ALBUMIN 3.2* 3.0* 3.1* 3.4* 3.1* 3.0*   No results for input(s): "LIPASE", "AMYLASE" in the last 168 hours. No results for input(s): "AMMONIA" in the last 168 hours.  ABG    Component Value Date/Time   PHART 7.24 (L) 08/26/2022 0455   PCO2ART 41 08/26/2022 0455   PO2ART 69 (L) 08/26/2022 0455   HCO3 17.6 (L) 08/26/2022 0455   TCO2 23 10/26/2020 1619   ACIDBASEDEF 9.3 (H) 08/26/2022 0455   O2SAT 92 08/26/2022 0455     Coagulation Profile: No results for input(s): "INR", "PROTIME" in the last 168 hours.  Cardiac Enzymes: No results for input(s): "CKTOTAL", "CKMB", "CKMBINDEX", "TROPONINI" in the last 168 hours.  HbA1C: Hgb A1c MFr Bld  Date/Time Value Ref Range Status  08/18/2022 12:11 PM 7.8 (H) 4.8 - 5.6 % Final    Comment:    (NOTE) Pre diabetes:          5.7%-6.4%  Diabetes:              >6.4%  Glycemic control for   <7.0% adults with diabetes   04/16/2022 02:15 AM 7.3 (H) 4.8 - 5.6 % Final    Comment:    (NOTE) Pre diabetes:          5.7%-6.4%  Diabetes:              >6.4%  Glycemic control for   <7.0% adults with diabetes     CBG: Recent Labs  Lab 08/25/22 1658 08/25/22 2011 08/25/22 2351 08/26/22 0236 08/26/22 0726  GLUCAP 107* 105* 92 63* 133*    Review of Systems:   Unable to obtain.  Past Medical History:  She,  has a past medical history of (HFpEF) heart failure with preserved ejection fraction (HCC), Anxiety, Asthma, Bronchitis, CHF (congestive heart failure) (HCC), Chronic cough, Chronic respiratory failure with hypoxia and hypercapnia (HCC), CKD (chronic kidney disease), stage III (HCC), Depression, Diabetes mellitus without complication (HCC), Diverticulosis, Dyspnea, Dysrhythmia, Environmental and seasonal allergies, Extreme obesity with alveolar hypoventilation (HCC), Hypertension, Iron deficiency anemia, Kidney failure, Orthopnea, Oxygen dependent, Pericardial effusion, Pneumonia, Poorly  controlled diabetes mellitus (HCC), and Swelling of both lower extremities.   Surgical History:   Past Surgical History:  Procedure Laterality Date   A/V FISTULAGRAM Left 06/09/2017   Procedure: A/V FISTULAGRAM;  Surgeon: Annice Needy, MD;  Location: [facility];  Service: Cardiovascular;  Laterality: Left;   A/V FISTULAGRAM Left 06/16/2017   Procedure: A/V FISTULAGRAM;  Surgeon: Annice Needy, MD;  Location: [facility];  Service: Cardiovascular;  Laterality: Left;   A/V FISTULAGRAM Left 01/28/2018   Procedure: A/V FISTULAGRAM;  Surgeon: Wyn Quaker,  Marlow Baars, MD;  Location: [facility];  Service: Cardiovascular;  Laterality: Left;   AMPUTATION TOE Right 10/21/2020   Procedure: AMPUTATION TOE-5th Digit Amputation Right Foot;  Surgeon: Felecia Shelling, DPM;  Location: [facility];  Service: Podiatry;  Laterality: Right;   AV FISTULA PLACEMENT Left 04/25/2017   Procedure: ARTERIOVENOUS (AV) FISTULA CREATION;  Surgeon: Annice Needy, MD;  Location: [facility];  Service: Vascular;  Laterality: Left;   BONE BIOPSY Right 04/27/2021   Procedure: Right 4th metatarsal BONE BIOPSY;  Surgeon: Candelaria Stagers, DPM;  Location: [facility];  Service: Podiatry;  Laterality: Right;   CATARACT EXTRACTION W/PHACO Left 01/21/2018   Procedure: CATARACT EXTRACTION PHACO AND INTRAOCULAR LENS PLACEMENT (IOC);  Surgeon: Lockie Mola, MD;  Location: [facility];  Service: Ophthalmology;  Laterality: Left;  [place] 01:55.5 CDE 9.05 EAUP 13.9 Fluid Pack Lot # [id number]   CERVICAL CONIZATION W/BX N/A 08/21/2022   Procedure: CERVICAL BIOPSY;  Surgeon: Feliberto Gottron Ihor Austin, MD;  Location: [facility];  Service: Gynecology;  Laterality: N/A;   CESAREAN SECTION     times 3   DIALYSIS/PERMA CATHETER INSERTION Right 04/04/2017   Procedure: DIALYSIS/PERMA CATHETER INSERTION;  Surgeon: Renford Dills, MD;  Location: [facility];  Service: Cardiovascular;  Laterality: Right;   DIALYSIS/PERMA CATHETER REMOVAL N/A  09/17/2017   Procedure: DIALYSIS/PERMA CATHETER REMOVAL;  Surgeon: Annice Needy, MD;  Location: [facility];  Service: Cardiovascular;  Laterality: N/A;   INCISION AND DRAINAGE Right 10/26/2020   Procedure: INCISION AND DRAINAGE;  Surgeon: Candelaria Stagers, DPM;  Location: [facility];  Service: Podiatry;  Laterality: Right;   INSERTION OF DIALYSIS CATHETER N/A 04/16/2017   Procedure: INSERTION OF DIALYSIS PERM CATHETER;  Surgeon: Annice Needy, MD;  Location: [facility];  Service: Vascular;  Laterality: N/A;   LOWER EXTREMITY ANGIOGRAPHY Right 10/18/2020   Procedure: Lower Extremity Angiography;  Surgeon: Annice Needy, MD;  Location: [facility];  Service: Cardiovascular;  Laterality: Right;   LOWER EXTREMITY ANGIOGRAPHY Right 04/17/2022   Procedure: Lower Extremity Angiography;  Surgeon: Annice Needy, MD;  Location: [facility];  Service: Cardiovascular;  Laterality: Right;     Social History:   reports that she has never smoked. She has been exposed to tobacco smoke. She has never used smokeless tobacco. She reports that she does not currently use alcohol. She reports current drug use. Drug: Marijuana.   Family History:  Her family history includes Breast cancer (age of onset: 51) in her maternal aunt and maternal aunt; Diabetes in her father, mother, and another family member; Hypertension in her father, mother, and another family member.   Allergies Allergies  Allergen Reactions   Dust Mite Extract Shortness Of Breath and Itching   Mold Extract [Trichophyton] Shortness Of Breath and Itching   Pollen Extract Shortness Of Breath    Itching, shortness of breath, asthma like symptoms   Ozempic (0.25 Or 0.5 Mg-Dose) [Semaglutide(0.25 Or 0.5mg -Dos)] Other (See Comments)    pancreatitis   Sulfa Antibiotics Itching   Feraheme [Ferumoxytol] Itching    Uses benadryl so she can get the medicine     Home Medications  Prior to Admission medications   Medication Sig Start Date  End Date Taking? Authorizing Provider  acetaminophen (TYLENOL) 500 MG tablet Take 1,000 mg by mouth every 6 (six) hours as needed for mild pain, fever or headache.   Yes [provider]  albuterol (PROVENTIL) (2.5 MG/3ML) 0.083% nebulizer solution  Take 3 mLs (2.5 mg total) by nebulization every 6 (six) hours as needed for wheezing or shortness of breath. 02/15/22  Yes Salena Saner, MD  albuterol (VENTOLIN HFA) 108 (90 Base) MCG/ACT inhaler Inhale 2 puffs into the lungs every 6 (six) hours as needed for wheezing or shortness of breath. 02/15/22  Yes Salena Saner, MD  atorvastatin (LIPITOR) 40 MG tablet Take 1 tablet (40 mg total) by mouth daily. 04/20/22  Yes Regalado, Belkys A, MD  benzonatate (TESSALON PERLES) 100 MG capsule Take 1 capsule (100 mg total) by mouth 3 (three) times daily as needed for cough. 05/22/22 05/22/23 Yes Boswell, Chelsa, NP  budesonide (PULMICORT) 0.5 MG/2ML nebulizer solution USE 1 VIAL  IN  NEBULIZER TWICE  DAILY - (Rinse Mouth After Each Treatment) 08/13/22  Yes Orson Eva, NP  ciprofloxacin (CIPRO) 500 MG tablet Take 500 mg by mouth daily. 08/13/22  Yes [provider]  clindamycin (CLEOCIN T) 1 % lotion APPLY TO FACE, ARMS AND LEGS EVENLY NIGHTLY AT BEDTIME 06/27/22  Yes Boswell, Chelsa, NP  escitalopram (LEXAPRO) 10 MG tablet Take 1 tablet (10 mg total) by mouth daily. 04/24/22 04/24/23 Yes Boswell, Gareth Morgan, NP  formoterol (PERFOROMIST) 20 MCG/2ML nebulizer solution USE 1 VIAL  IN  NEBULIZER TWICE  DAILY - (Morning And Evening) 08/13/22  Yes Boswell, Chelsa, NP  gentamicin cream (GARAMYCIN) 0.1 % Apply 1 Application topically at bedtime. 06/27/22  Yes [provider]  insulin degludec (TRESIBA) 100 UNIT/ML FlexTouch Pen Inject 20 Units into the skin daily. 04/20/22  Yes Regalado, Belkys A, MD  lidocaine-prilocaine (EMLA) cream Apply topically as needed (For access for dialysis).   Yes [provider]  LINZESS 290 MCG CAPS capsule TAKE  1 CAPSULE BY MOUTH IN THE MORNING WITH A GLASS OF WATER 30 MINUTES BEFORE BREAKFAST 08/08/22  Yes Miki Kins, FNP  methocarbamol (ROBAXIN) 750 MG tablet Take 750 mg by mouth every 8 (eight) hours as needed for muscle spasms.   Yes [provider]  midodrine (PROAMATINE) 10 MG tablet Take 20 mg by mouth daily as needed (for low blood pressure on dialysis days). 08/23/21  Yes [provider]  ondansetron (ZOFRAN) 4 MG tablet Take 4 mg by mouth 2 (two) times daily as needed for nausea. 07/02/22  Yes [provider]  pregabalin (LYRICA) 50 MG capsule TAKE 1 CAPSULE BY MOUTH EVERY 8 HOURS AS NEEDED FOR  NEUROPATHY 07/01/22  Yes Orson Eva, NP  revefenacin (YUPELRI) 175 MCG/3ML nebulizer solution  01/19/21  Yes [provider]  sevelamer (RENAGEL) 800 MG tablet Take 800-2,400 mg by mouth 4 (four) times daily. Three tablets three times daily with meals and 1 tablet with snacks once daily   Yes [provider]  torsemide (DEMADEX) 100 MG tablet Take 100 mg by mouth daily. 05/21/22  Yes [provider]  aspirin EC 81 MG tablet Take 81 mg by mouth daily.  Patient not taking: Reported on 08/19/2022    [provider]  cloNIDine (CATAPRES) 0.1 MG tablet Take 0.1 mg by mouth 2 (two) times daily. Patient not taking: Reported on 08/19/2022 03/21/22   [provider]  doxepin (SINEQUAN) 10 MG capsule Take 1 capsule (10 mg total) by mouth at bedtime. Patient not taking: Reported on 08/19/2022 04/24/22   Orson Eva, NP  ferric citrate (AURYXIA) 1 GM 210 MG(Fe) tablet Take 210 mg by mouth 3 (three) times daily with meals. Only taking once a day most days Patient not taking: Reported  on 08/19/2022    [provider]  insulin lispro (HUMALOG) 100 UNIT/ML KwikPen Inject into the skin. Patient not taking: Reported on 08/19/2022 06/12/21   [provider]  lactulose (CHRONULAC) 10 GM/15ML solution Take 30 mLs (20 g total) by mouth  daily as needed for severe constipation. Patient not taking: Reported on 08/19/2022 04/26/17   Katha Hamming, MD  montelukast (SINGULAIR) 10 MG tablet Take 1 tablet (10 mg total) by mouth at bedtime. Patient not taking: Reported on 08/19/2022 09/17/21   Salena Saner, MD  Omeprazole Magnesium (PRILOSEC PO)     [provider]  OXYGEN Inhale 2-4 L into the lungs.    [provider]  predniSONE (DELTASONE) 50 MG tablet Take 1 tablet by mouth daily in AM with food x 5 days Patient not taking: Reported on 08/19/2022 04/24/22   Orson Eva, NP  RENVELA 2.4 g PACK Take 2.4 g by mouth 3 (three) times daily. Patient not taking: Reported on 08/19/2022 08/07/21   [provider]  sucroferric oxyhydroxide (VELPHORO) 500 MG chewable tablet Chew 500 mg by mouth 3 (three) times daily with meals.    [provider]  Vitamin D, Cholecalciferol, 25 MCG (1000 UT) TABS Take 1,000 Units by mouth daily. Patient not taking: Reported on 08/19/2022    [provider]     Critical care time: 40 minutes     Raechel Chute, MD [facility] 08/26/2022 1:54 PM

## 2022-08-26 NOTE — Progress Notes (Signed)
Initial Nutrition Assessment  DOCUMENTATION CODES:   Morbid obesity  INTERVENTION:   Vital 1.2@60ml /hr- Initiate at 24ml/hr and increase by 28ml/hr q 8 hours until goal rate is reached.   Free water flushes 30ml q4 hours to maintain tube patency   Regimen provides 1728kcal/day, 108g/day protein and 1371ml/day of free water.   Pt at high refeed risk; recommend monitor potassium, magnesium and phosphorus labs daily until stable  Daily weights   Rena-vit daily via tube   Juven Fruit Punch BID via tube, each serving provides 95kcal and 2.5g of protein (amino acids glutamine and arginine)  NUTRITION DIAGNOSIS:   Inadequate oral intake related to inability to eat (pt sedated and ventilated) as evidenced by NPO status.  GOAL:   Provide needs based on ASPEN/SCCM guidelines  MONITOR:   Vent status, Labs, Weight trends, TF tolerance, I & O's, Skin  REASON FOR ASSESSMENT:   Consult Assessment of nutrition requirement/status  ASSESSMENT:   52 y/o female with h/o uterine fibroid, IDA, anxiety, COPD, asthma, OSA, CHF, ESRD on HD, PVD with chronic foot wound, depression, GERD, marijuana use, pulmonary hypertension and cervical mass who is admitted with UTI and encephalopathy complicated by respiaratory failure and cardiac arrest requiring intubation 7/1.  Pt sedated and ventilated. OGT in place but needs advancement. Will plan to initiate tube feeds today. Pt documented to be eating <25% of meals in hospital prior to intubation; suspect pt is at high refeed risk. Per chart, pt appears weight stable at baseline; pt is currently up ~19lbs since admission.   Medications reviewed and include: aspirin, D3, colace, doxycycline, diflucan, insulin, metronidazole, protonix, renvela, ceftriaxone, epinephrine, propofol, Na bicarbonate   Labs reviewed: Na 130(L), K 5.0 wnl, BUN 64(H), creat 6.50(H), Mg 2.7(H), alk phos 570(H), AST 263(H), ALT 65(H) P 7.1(H)- 6/30 BNP- 1383(H) Wbc-  23.0(H) Cbgs- 133, 63 x 24 hrs  AIC 7.8(H)- 6/23  Patient is currently intubated on ventilator support MV: 12.9 L/min Temp (24hrs), Avg:97.5 F (36.4 C), Min:97 F (36.1 C), Max:98 F (36.7 C)  Propofol: 11.55 ml/hr- provides 304kcal/day   MAP- >53mmHg   NUTRITION - FOCUSED PHYSICAL EXAM:  Flowsheet Row Most Recent Value  Orbital Region No depletion  Upper Arm Region No depletion  Thoracic and Lumbar Region No depletion  Buccal Region No depletion  Temple Region No depletion  Clavicle Bone Region No depletion  Clavicle and Acromion Bone Region No depletion  Scapular Bone Region No depletion  Dorsal Hand No depletion  Patellar Region No depletion  Anterior Thigh Region No depletion  Posterior Calf Region No depletion  Edema (RD Assessment) Mild  Hair Reviewed  Eyes Reviewed  Mouth Reviewed  Skin Reviewed  Nails Reviewed   Diet Order:   Diet Order             Diet renal/carb modified with fluid restriction Diet-HS Snack? Nothing; Fluid restriction: 1200 mL Fluid; Room service appropriate? Yes; Fluid consistency: Thin  Diet effective now                  EDUCATION NEEDS:   No education needs have been identified at this time  Skin:  Skin Assessment: Reviewed RN Assessment (incision vagina, R foot wound- 11.5cm x 7.5cm x 0.3cm)  Last BM:  7/1- type 1  Height:   Ht Readings from Last 1 Encounters:  08/21/22 5' 4.5" (1.638 m)    Weight:   Wt Readings from Last 1 Encounters:  08/25/22 128.3 kg    Ideal Body Weight:  55.6 kg  BMI:  Body mass index is 47.8 kg/m.  Estimated Nutritional Needs:   Kcal:  1315-1666kcal/day  Protein:  >110g/day  Fluid:  1.7-2.0L/day  Betsey Holiday MS, RD, LDN Please refer to [facility] for RD and/or RD on-call/weekend/after hours pager

## 2022-08-26 NOTE — Progress Notes (Signed)
0200- patient incontinent of stool, patient cleaned of stool and linens changed. After sliding patient up in bed, while repositioning her BP cuff she went unresponsive with no response to sternal rub. Provider to bedside and code blue called. See code blue sheet

## 2022-08-26 NOTE — Progress Notes (Signed)
CODE BLUE NOTE  Patient Name: [name]   MRN: [id number]   Date of Birth/ Sex: May 29, 1970 , female      Admission Date: 08/18/2022  Attending Provider: Raechel Chute, MD  Primary Diagnosis: UTI (urinary tract infection)   Cardiopulmonary Resuscitation Directed by: [name] , NP   I personally directed ancillary staff and/or performed CPR in an effort to regain return of spontaneous circulation.   Indication: Pt was in her usual state of health until this until 2:05 am when was noted to be unresponsive.  Per RN and RT, patient refused her BiPAP yesterday at 8 pm due to  improperly fitting mask and claustrophobia. She was found to be unresponsive but with a weak pulse. Code blue was subsequently called. At the time of arrival on scene, patient was unresponsive and bradycardia in the 30'2-40 on the monitor.  Atropine was administered with brief responsiveness then she subsequently lost a pulse and CPR was initiated and ACLS drugs administered as below. Patient was immediately intubated for airway protection.    Technical Description:  - CPR performance duration:  15 minutes  - Was defibrillation or cardioversion used? No   - Was external pacer placed? No  - Was patient intubated pre/post CPR? Yes    Medications Administered: Y = Yes; Blank = No Amiodarone    Atropine  X 1  Calcium  X 1  Epinephrine  X 3  Lidocaine    Magnesium  2g  Norepinephrine  Y  Phenylephrine  NO  Sodium bicarbonate  X 4  Vasopressin   D50 1 amp  Lasix 40 mg          Post CPR evaluation:  - Final Status - Was patient successfully resuscitated ? Yes - What is current rhythm? ST - What is current hemodynamic status? Hypotensive requiring pressors.   Miscellaneous Information:  - Labs sent, including: CBC, CMP, Lactate, mag, troponin, BNP  - Primary team notified?  Yes  - Family Notified? Yes  - Additional notes/ transfer status: Patient currently unstable for further imaging    Goals  of Care:  Family and staff present: Family notified and updated at the bedside   Assessment & Plan:   #Cardiac Arrest: Initial rhythm Bradycardia #Elevated Troponin, suspect demand ischemia #Circulatory shock - Initiating Normothermic protocol - CT head once stable - Continue Vasopressors to maintain MAP > 70  - Follow up EKG in am, Trend Troponin - continuous cardiac monitoring - STAT labs: CBC, BMP, Mg, Phos, Lactic, PT/ INR, troponin - replace electrolytes PRN - Will need CT Angio eval for PE once stable  Acute on Chronic hypoxic hypercapnic  respiratory failure secondary  Severe Persistent Asthamatic Bronchitis OSA on CPAP Hypoventilation Syndrome Morbid Obesity -Full vent support, implement lung protective strategies -Plateau pressures less than 30 cm H20 -Wean FiO2 & PEEP as tolerated to maintain O2 sats 88 to 92% -Follow intermittent Chest X-ray & ABG as needed -Spontaneous Breathing Trials when respiratory parameters met and mental status permits -Implement VAP Bundle -Scheduled and PRN Bronchodilators  #ESRD on HD #Anion Gap Metabolic Acidosis due Lactic Acidosis #Hyperkalemia -Monitor I&O's / urinary output -Follow BMP -Ensure adequate renal perfusion -Avoid nephrotoxic agents as able -Replace electrolytes as indicated -Continue Bicarb gtt -Trend Lactic -Nephrology following, will likely need CRRT due to hemodynamic instability  #Chronic Diastolic CHF without acute decompensation #Pulmonary Hypertension -Maintain MAP >65 -Transfusions as indicated -Vasopressors as needed to maintain MAP goal -Trend lactic acid until normalized -Trend HS Troponin  until peaked -Consider Echocardiogram   #T2DM -CBG's q4; Target range of 140 to 180 -SSI -Follow ICU Hypo/Hyperglycemia protocol   #Acute toxic metabolic encephalopathy in the setting of above CO2 Narcosis, Hypoventillation syndrome -Supportive care as above -Minimize sedation as able   # Acute on Chronic  Transaminitis ?Worsened by Shock  - trend LFTs -Check Hepatitis panel -No evidence of cholecystitis     Additional CC time 32 mins    [name] DNP, FNP-BC, AGACNP-BC [facility] Pager: [phone] [place] [facility]     SEE FULL PROGRESS NOTES FOR TODAY

## 2022-08-26 NOTE — Progress Notes (Signed)
Sputum specimen sent to the lab per MD order. 

## 2022-08-26 NOTE — Consult Note (Signed)
Pharmacy Antibiotic Note  [name] is a 52 y.o. female admitted on 08/18/2022 with  interm history of dysuria as well as foul odor associated with her urine .  Pharmacy has been consulted for Vancomycin dosing.  Patient was in usual state until the early morning of 7/1 when she was found unresponsive. Per RN and RT, patient refused her BiPAP yesterday at 8 pm due to  improperly fitting mask and claustrophobia. She was found to be unresponsive but with a weak pulse. Code blue was subsequently called. At the time of arrival on scene, patient was unresponsive and bradycardia in the 30'2-40 on the monitor.  Atropine was administered with brief responsiveness then she subsequently lost a pulse and CPR was initiated and ACLS drugs administered as below. Patient was immediately intubated for airway protection.   [name] kidney function continues to worsen, now transitioned from hemodialysis to CRRT.  Plan: Vancomycin 2g IV x1 as loading dose, followed by 1500mg  Q24 hours(~12mg /kg) per [place] CRRT protocol   Height: 5' 4.5" (163.8 cm) Weight: 128.3 kg (282 lb 13.6 oz) IBW/kg (Calculated) : 55.85  Temp (24hrs), Avg:98.2 F (36.8 C), Min:97 F (36.1 C), Max:99.5 F (37.5 C)  Recent Labs  Lab 08/23/22 0640 08/24/22 0605 08/24/22 1849 08/25/22 0517 08/25/22 1259 08/25/22 1637 08/26/22 0351 08/26/22 0708 08/26/22 1225 08/26/22 1316  WBC 21.7* 19.4*  --  25.4*  --   --  23.0*  --   --  24.7*  CREATININE 3.74* 5.73* 5.42* 5.80*  --   --  6.50*  --   --  6.26*  LATICACIDVEN  --   --   --   --  4.1* 3.8* 6.9* 6.2* 5.2*  --     Estimated Creatinine Clearance: 14.2 mL/min (A) (by C-G formula based on SCr of 6.26 mg/dL (H)).    Allergies  Allergen Reactions   Dust Mite Extract Shortness Of Breath and Itching   Mold Extract [Trichophyton] Shortness Of Breath and Itching   Pollen Extract Shortness Of Breath    Itching, shortness of breath, asthma like symptoms   Ozempic (0.25 Or  0.5 Mg-Dose) [Semaglutide(0.25 Or 0.5mg -Dos)] Other (See Comments)    pancreatitis   Sulfa Antibiotics Itching   Feraheme [Ferumoxytol] Itching    Uses benadryl so she can get the medicine    Antimicrobials this admission: Vancomycin 7/1 >>  Rocephin 6/23 >>  Flagyl 6/25 >> Fluconazole 6/28 >>  Dose adjustments this admission: N/A  Microbiology results: 6/30 BCx: NGTD 6/23 UCx: 100k yeast  6/30 MRSA PCR: negative 7/1 resp cx: pending  Thank you for allowing pharmacy to be a part of this patient's care.  Harrell Lark A Booker Bhatnagar 08/26/2022 3:26 PM

## 2022-08-26 NOTE — Plan of Care (Addendum)
Pt care plan reviewed with families, Pt was taking to Vascular for CVC exchange. Right Femoral hemodialysis catheter triple lumen placed with piggy tail. Left femoral CVC triple lumen placed.  Pt stated on CRRT. Patient continued on full vent support. 60%/8/18/530. Tube feeing started and pt tolerating it well. No urine output today and bladder scan show 25 mls. Patient family have been updated several  times today by Dr Alger Simons NP and Np from vascular services.

## 2022-08-26 NOTE — Op Note (Signed)
OPERATIVE NOTE   PROCEDURE: Insertion of a temporary dialysis catheter, trialysis type right common femoral vein.   Removal of a catheter from the right common carotid artery with Perclose closure of the arteriotomy. Contrast injection arch aortogram with selective injection of the right common carotid artery Insertion of triple-lumen central venous catheter left common femoral approach.  PRE-OPERATIVE DIAGNOSIS:  1.  Placement of a triple-lumen catheter into the right common carotid. 2.  Multisystem organ dysfunction associated with cardiac arrest requiring pressors. 3.  End-stage renal disease requiring conversion from hemodialysis to CRRT  POST-OPERATIVE DIAGNOSIS: Same  SURGEON: Renford Dills M.D.  ANESTHESIA: Continuous ECG pulse oximetry and cardiopulmonary monitoring was performed throughout the entire procedure by the intensive care nurse.  Parenteral propofol was utilized.  Total sedation time was 50 minutes.  ESTIMATED BLOOD LOSS: Minimal cc  Fluoroscopy time:  1.0 minutes.  Contrast: 10 cc.  INDICATIONS:   [name] is a 52 y.o. female who presents with septic shock.  Central venous access was needed and a triple-lumen catheter was placed.  The chest x-ray from this morning suggested possible intra-arterial placement and the catheter was transduced which did return a waveform consistent with the arterial positioning.  I have therefore asked to remove the catheter to repair the carotid artery and then secure appropriate intravenous access.  DESCRIPTION: After obtaining full informed written consent, the patient was positioned supine. The right groin was prepped and draped in a sterile fashion. Ultrasound was placed in a sterile sleeve. Ultrasound was utilized to identify the right common femoral vein which is noted to be echolucent and compressible indicating patency. Images recorded for the permanent record.  1% lidocaine was infiltrated into the soft tissues.   Under real-time visualization a Seldinger needle is inserted into the vein and the guidewires advanced without difficulty. Small counterincision was made at the wire insertion site. Dilators passed over the wire and the triple-lumen trialysis catheter is fed without difficulty.  All 3 lm aspirate and flush easily and are packed with heparin saline. Catheter secured to the skin of the right thigh with 2-0 silk. A sterile dressing is applied with Biopatch.  Immediately the central venous lumen was used for pressors.  Attention was then turned to the right neck.  Through the existing triple-lumen catheter contrast injection demonstrated that indeed this was the arch and the catheter was intra-arterial.  The catheter was then removed over a wire and a micro puncture sheath was advanced over the 018 wire.  J-wire was then advanced through the micro sheath and a 5 French 11 cm Pinnacle sheath was placed.  Hand-injection of contrast through the pedicle sheath demonstrated placement within the right common carotid artery.  A J-wire was then introduced the sheath moved a Perclose device was then used to close the arteriotomy a single device was needed.  After checking for hemostasis the wire was removed and that not secured.  No evidence of bleeding or hematoma formation was noted and attention was then turned to the left groin.  Given the patient's critical status it was apparent that a single venous lumen would not be adequate.  The left groin was then prepped and draped in a sterile fashion.  Ultrasound was returned to the field and placed in a sterile sleeve.  Left common femoral vein was easily identified.  It was echolucent and compressible indicating patency.  Images recorded for the permanent record.  Seldinger needle was then inserted directly into the common femoral vein followed  by the J-wire.  A small nick was made in the skin dilator passed over the wire and then a triple-lumen catheter fed without  difficulty.  All 3 lm aspirated and flushed easily and the catheter was secured to the skin of the thigh with 2-0 silk.  Biopatch and sterile dressing were applied.   COMPLICATIONS: None  CONDITION: Critical  Renford Dills, M.D. [facility]. Office:  [phone]   08/26/2022, 1:15 PM

## 2022-08-26 NOTE — Consult Note (Signed)
Hospital Consult    Reason for Consult:  Right Lower Extremity Foot Ulcer with Gangrene toes. Requesting Physician:  Dr Raechel Chute MD MRN #:  [id number]  History of Present Illness: This is a 52 y.o. female with a past medical history of ESRD (on HD), COPD/Asthma, OSA/OHS, chronic hypoxic respiratory failure, HTN, HFpEF, T2DM, and morbid obesity who presented to the hospital with dysuria, now in the ICU with altered mental status complicated by cardiac arrest.   Vascular Surgery consulted for patients right lower extremity foot ulcer with gangrene toes. Patient is intubated and sedated at this time so history taken from the chart. It appears the patient presented with her foot in this condition. Patient currently unable to participate. On multiple pressors, sedation via propofol, ventilated and on bedside CCRT.    Past Medical History:  Diagnosis Date   (HFpEF) heart failure with preserved ejection fraction (HCC)    a. 11/2014 Echo: EF 50-55%, Gr1 DD; b. 06/2016 Echo: EF 60-65%, no rwma, mod dil LA, nl RV, triv eff.   Anxiety    Asthma    Bronchitis    CHF (congestive heart failure) (HCC)    Chronic cough    Chronic respiratory failure with hypoxia and hypercapnia (HCC)    a. Home O2.  (2-4L)   CKD (chronic kidney disease), stage III (HCC)    Depression    Diabetes mellitus without complication (HCC)    Diverticulosis    Dyspnea    Dysrhythmia    per pt, no arrythmias   Environmental and seasonal allergies    Extreme obesity with alveolar hypoventilation (HCC)    Hypertension    Iron deficiency anemia    a. chronic blood loss->heavy menses.   Kidney failure    dialysis tues/thur/sat   ... left arm shunt   Orthopnea    Oxygen dependent    3l/min   Pericardial effusion    a. 11/2014 CT Chest: Mod effusion; b. 11/2014 Echo: small to mod circumferential effusion. No hemodynamic compromise.   Pneumonia    history   Poorly controlled diabetes mellitus (HCC)    a. 11/2014 A1c  13.2.   Swelling of both lower extremities     Past Surgical History:  Procedure Laterality Date   A/V FISTULAGRAM Left 06/09/2017   Procedure: A/V FISTULAGRAM;  Surgeon: Annice Needy, MD;  Location: [facility];  Service: Cardiovascular;  Laterality: Left;   A/V FISTULAGRAM Left 06/16/2017   Procedure: A/V FISTULAGRAM;  Surgeon: Annice Needy, MD;  Location: [facility];  Service: Cardiovascular;  Laterality: Left;   A/V FISTULAGRAM Left 01/28/2018   Procedure: A/V FISTULAGRAM;  Surgeon: Annice Needy, MD;  Location: [facility];  Service: Cardiovascular;  Laterality: Left;   AMPUTATION TOE Right 10/21/2020   Procedure: AMPUTATION TOE-5th Digit Amputation Right Foot;  Surgeon: Felecia Shelling, DPM;  Location: [facility];  Service: Podiatry;  Laterality: Right;   AV FISTULA PLACEMENT Left 04/25/2017   Procedure: ARTERIOVENOUS (AV) FISTULA CREATION;  Surgeon: Annice Needy, MD;  Location: [facility];  Service: Vascular;  Laterality: Left;   BONE BIOPSY Right 04/27/2021   Procedure: Right 4th metatarsal BONE BIOPSY;  Surgeon: Candelaria Stagers, DPM;  Location: [facility];  Service: Podiatry;  Laterality: Right;   CATARACT EXTRACTION W/PHACO Left 01/21/2018   Procedure: CATARACT EXTRACTION PHACO AND INTRAOCULAR LENS PLACEMENT (IOC);  Surgeon: Lockie Mola, MD;  Location: [facility];  Service: Ophthalmology;  Laterality: Left;  [place] 01:55.5 CDE  9.05 EAUP 13.9 Fluid Pack Lot # [id number]   CERVICAL CONIZATION W/BX N/A 08/21/2022   Procedure: CERVICAL BIOPSY;  Surgeon: Feliberto Gottron Ihor Austin, MD;  Location: [facility];  Service: Gynecology;  Laterality: N/A;   CESAREAN SECTION     times 3   DIALYSIS/PERMA CATHETER INSERTION Right 04/04/2017   Procedure: DIALYSIS/PERMA CATHETER INSERTION;  Surgeon: Renford Dills, MD;  Location: [facility];  Service: Cardiovascular;  Laterality: Right;   DIALYSIS/PERMA CATHETER REMOVAL N/A 09/17/2017   Procedure: DIALYSIS/PERMA CATHETER  REMOVAL;  Surgeon: Annice Needy, MD;  Location: [facility];  Service: Cardiovascular;  Laterality: N/A;   INCISION AND DRAINAGE Right 10/26/2020   Procedure: INCISION AND DRAINAGE;  Surgeon: Candelaria Stagers, DPM;  Location: [facility];  Service: Podiatry;  Laterality: Right;   INSERTION OF DIALYSIS CATHETER N/A 04/16/2017   Procedure: INSERTION OF DIALYSIS PERM CATHETER;  Surgeon: Annice Needy, MD;  Location: [facility];  Service: Vascular;  Laterality: N/A;   LOWER EXTREMITY ANGIOGRAPHY Right 10/18/2020   Procedure: Lower Extremity Angiography;  Surgeon: Annice Needy, MD;  Location: [facility];  Service: Cardiovascular;  Laterality: Right;   LOWER EXTREMITY ANGIOGRAPHY Right 04/17/2022   Procedure: Lower Extremity Angiography;  Surgeon: Annice Needy, MD;  Location: [facility];  Service: Cardiovascular;  Laterality: Right;    Allergies  Allergen Reactions   Dust Mite Extract Shortness Of Breath and Itching   Mold Extract [Trichophyton] Shortness Of Breath and Itching   Pollen Extract Shortness Of Breath    Itching, shortness of breath, asthma like symptoms   Ozempic (0.25 Or 0.5 Mg-Dose) [Semaglutide(0.25 Or 0.5mg -Dos)] Other (See Comments)    pancreatitis   Sulfa Antibiotics Itching   Feraheme [Ferumoxytol] Itching    Uses benadryl so she can get the medicine    Prior to Admission medications   Medication Sig Start Date End Date Taking? Authorizing Provider  acetaminophen (TYLENOL) 500 MG tablet Take 1,000 mg by mouth every 6 (six) hours as needed for mild pain, fever or headache.   Yes [provider]  albuterol (PROVENTIL) (2.5 MG/3ML) 0.083% nebulizer solution Take 3 mLs (2.5 mg total) by nebulization every 6 (six) hours as needed for wheezing or shortness of breath. 02/15/22  Yes Salena Saner, MD  albuterol (VENTOLIN HFA) 108 (90 Base) MCG/ACT inhaler Inhale 2 puffs into the lungs every 6 (six) hours as needed for wheezing or shortness of breath.  02/15/22  Yes Salena Saner, MD  atorvastatin (LIPITOR) 40 MG tablet Take 1 tablet (40 mg total) by mouth daily. 04/20/22  Yes Regalado, Belkys A, MD  benzonatate (TESSALON PERLES) 100 MG capsule Take 1 capsule (100 mg total) by mouth 3 (three) times daily as needed for cough. 05/22/22 05/22/23 Yes Boswell, Chelsa, NP  budesonide (PULMICORT) 0.5 MG/2ML nebulizer solution USE 1 VIAL  IN  NEBULIZER TWICE  DAILY - (Rinse Mouth After Each Treatment) 08/13/22  Yes Orson Eva, NP  ciprofloxacin (CIPRO) 500 MG tablet Take 500 mg by mouth daily. 08/13/22  Yes [provider]  clindamycin (CLEOCIN T) 1 % lotion APPLY TO FACE, ARMS AND LEGS EVENLY NIGHTLY AT BEDTIME 06/27/22  Yes Boswell, Chelsa, NP  escitalopram (LEXAPRO) 10 MG tablet Take 1 tablet (10 mg total) by mouth daily. 04/24/22 04/24/23 Yes Boswell, Gareth Morgan, NP  formoterol (PERFOROMIST) 20 MCG/2ML nebulizer solution USE 1 VIAL  IN  NEBULIZER TWICE  DAILY - (Morning And Evening) 08/13/22  Yes Orson Eva, NP  gentamicin cream (GARAMYCIN) 0.1 % Apply 1 Application topically at bedtime. 06/27/22  Yes [provider]  insulin degludec (TRESIBA) 100 UNIT/ML FlexTouch Pen Inject 20 Units into the skin daily. 04/20/22  Yes Regalado, Belkys A, MD  lidocaine-prilocaine (EMLA) cream Apply topically as needed (For access for dialysis).   Yes [provider]  LINZESS 290 MCG CAPS capsule TAKE 1 CAPSULE BY MOUTH IN THE MORNING WITH A GLASS OF WATER 30 MINUTES BEFORE BREAKFAST 08/08/22  Yes Miki Kins, FNP  methocarbamol (ROBAXIN) 750 MG tablet Take 750 mg by mouth every 8 (eight) hours as needed for muscle spasms.   Yes [provider]  midodrine (PROAMATINE) 10 MG tablet Take 20 mg by mouth daily as needed (for low blood pressure on dialysis days). 08/23/21  Yes [provider]  ondansetron (ZOFRAN) 4 MG tablet Take 4 mg by mouth 2 (two) times daily as needed for nausea. 07/02/22  Yes [provider]   pregabalin (LYRICA) 50 MG capsule TAKE 1 CAPSULE BY MOUTH EVERY 8 HOURS AS NEEDED FOR  NEUROPATHY 07/01/22  Yes Orson Eva, NP  revefenacin (YUPELRI) 175 MCG/3ML nebulizer solution  01/19/21  Yes [provider]  sevelamer (RENAGEL) 800 MG tablet Take 800-2,400 mg by mouth 4 (four) times daily. Three tablets three times daily with meals and 1 tablet with snacks once daily   Yes [provider]  torsemide (DEMADEX) 100 MG tablet Take 100 mg by mouth daily. 05/21/22  Yes [provider]  aspirin EC 81 MG tablet Take 81 mg by mouth daily.  Patient not taking: Reported on 08/19/2022    [provider]  cloNIDine (CATAPRES) 0.1 MG tablet Take 0.1 mg by mouth 2 (two) times daily. Patient not taking: Reported on 08/19/2022 03/21/22   [provider]  doxepin (SINEQUAN) 10 MG capsule Take 1 capsule (10 mg total) by mouth at bedtime. Patient not taking: Reported on 08/19/2022 04/24/22   Orson Eva, NP  ferric citrate (AURYXIA) 1 GM 210 MG(Fe) tablet Take 210 mg by mouth 3 (three) times daily with meals. Only taking once a day most days Patient not taking: Reported on 08/19/2022    [provider]  insulin lispro (HUMALOG) 100 UNIT/ML KwikPen Inject into the skin. Patient not taking: Reported on 08/19/2022 06/12/21   [provider]  lactulose (CHRONULAC) 10 GM/15ML solution Take 30 mLs (20 g total) by mouth daily as needed for severe constipation. Patient not taking: Reported on 08/19/2022 04/26/17   Katha Hamming, MD  montelukast (SINGULAIR) 10 MG tablet Take 1 tablet (10 mg total) by mouth at bedtime. Patient not taking: Reported on 08/19/2022 09/17/21   Salena Saner, MD  Omeprazole Magnesium (PRILOSEC PO)     [provider]  OXYGEN Inhale 2-4 L into the lungs.    [provider]  predniSONE (DELTASONE) 50 MG tablet Take 1 tablet by mouth daily in AM with food x 5 days Patient not taking: Reported on 08/19/2022  04/24/22   Orson Eva, NP  RENVELA 2.4 g PACK Take 2.4 g by mouth 3 (three) times daily. Patient not taking: Reported on 08/19/2022 08/07/21   [provider]  sucroferric oxyhydroxide (VELPHORO) 500 MG chewable tablet Chew 500 mg by mouth 3 (three) times daily with meals.    [provider]  Vitamin D, Cholecalciferol, 25 MCG (1000 UT) TABS Take 1,000 Units by mouth daily. Patient not taking: Reported on 08/19/2022    [provider]  Social History   Socioeconomic History   Marital status: Married    Spouse name: abjul   Number of children: Not on file   Years of education: 12   Highest education level: High school graduate  Occupational History   Occupation: Disabled  Tobacco Use   Smoking status: Never    Passive exposure: Past   Smokeless tobacco: Never  Vaping Use   Vaping Use: Never used  Substance and Sexual Activity   Alcohol use: Not Currently    Comment: occasional   Drug use: Yes    Types: Marijuana   Sexual activity: Not Currently    Birth control/protection: None  Other Topics Concern   Not on file  Social History Narrative   Lives in [place] with her husband and 15 y/o child.  Three other children are grown and out of the house.  She is sedentary and does not routinely exercise.  On home O2.   Social Determinants of Health   Financial Resource Strain: High Risk (03/24/2017)   Overall Financial Resource Strain (CARDIA)    Difficulty of Paying Living Expenses: Very hard  Food Insecurity: No Food Insecurity (08/18/2022)   Hunger Vital Sign    Worried About Running Out of Food in the Last Year: Never true    Ran Out of Food in the Last Year: Never true  Transportation Needs: No Transportation Needs (08/18/2022)   PRAPARE - Administrator, Civil Service (Medical): No    Lack of Transportation (Non-Medical): No  Physical Activity: Inactive (03/24/2017)   Exercise Vital Sign    Days of Exercise per Week: 0 days    Minutes  of Exercise per Session: 0 min  Stress: No Stress Concern Present (03/24/2017)   [employer] of Occupational Health - Occupational Stress Questionnaire    Feeling of Stress : Not at all  Social Connections: Somewhat Isolated (03/24/2017)   Social Connection and Isolation Panel [NHANES]    Frequency of Communication with Friends and Family: More than three times a week    Frequency of Social Gatherings with Friends and Family: Once a week    Attends Religious Services: Never    Database administrator or Organizations: No    Attends Banker Meetings: Never    Marital Status: Married  Catering manager Violence: Not At Risk (08/18/2022)   Humiliation, Afraid, Rape, and Kick questionnaire    Fear of Current or Ex-Partner: No    Emotionally Abused: No    Physically Abused: No    Sexually Abused: No     Family History  Problem Relation Age of Onset   Diabetes Mother    Hypertension Mother    Hypertension Father    Diabetes Father    Breast cancer Maternal Aunt 69   Breast cancer Maternal Aunt 50   Hypertension Other    Diabetes Other     ROS: Otherwise negative unless mentioned in HPI  Physical Examination  Vitals:   08/26/22 1400 08/26/22 1500  BP: (!) 104/51   Pulse:    Resp: 18 18  Temp:    SpO2:     Body mass index is 47.8 kg/m.  General:  WDWN in NAD Gait: Not observed HENT: WNL, normocephalic Pulmonary: normal non-labored breathing, without Rales, rhonchi,  wheezing Cardiac: regular, without  Murmurs, rubs or gallops; without carotid bruits Abdomen: positive bowel sounds, Morbid obesity, soft, NT/ND, no masses Skin: without rashes Vascular Exam/Pulses: Unable to find doppler DP/PT pulses to right foot/ankle.  Extremities: with ischemic changes, with Gangrene , with cellulitis; with open wounds;  Musculoskeletal: no muscle wasting or atrophy  Neurologic: A&O X 3;  No focal weakness or paresthesias are detected; speech is  fluent/normal Psychiatric:  The pt has Normal affect. Lymph:  Unremarkable  CBC    Component Value Date/Time   WBC 24.7 (H) 08/26/2022 1316   RBC 4.77 08/26/2022 1316   HGB 14.1 08/26/2022 1316   HGB 12.4 01/11/2014 1324   HCT 42.1 08/26/2022 1316   HCT 39.2 01/11/2014 1324   PLT 284 08/26/2022 1316   PLT 370 01/11/2014 1324   MCV 88.3 08/26/2022 1316   MCV 86 01/11/2014 1324   MCH 29.6 08/26/2022 1316   MCHC 33.5 08/26/2022 1316   RDW 19.7 (H) 08/26/2022 1316   RDW 15.1 (H) 01/11/2014 1324   LYMPHSABS 0.4 (L) 08/26/2022 1316   LYMPHSABS 1.8 01/11/2014 1324   MONOABS 1.7 (H) 08/26/2022 1316   MONOABS 0.5 01/11/2014 1324   EOSABS 0.0 08/26/2022 1316   EOSABS 0.2 01/11/2014 1324   BASOSABS 0.1 08/26/2022 1316   BASOSABS 0.1 01/11/2014 1324    BMET    Component Value Date/Time   NA 130 (L) 08/26/2022 1316   NA 135 (L) 11/21/2013 1153   K 5.0 08/26/2022 1316   K 4.1 11/21/2013 1153   CL 90 (L) 08/26/2022 1316   CL 98 11/21/2013 1153   CO2 17 (L) 08/26/2022 1316   CO2 30 11/21/2013 1153   GLUCOSE 221 (H) 08/26/2022 1316   GLUCOSE 287 (H) 11/21/2013 1153   BUN 66 (H) 08/26/2022 1316   BUN 16 11/21/2013 1153   CREATININE 6.26 (H) 08/26/2022 1316   CREATININE 0.85 11/21/2013 1153   CALCIUM 7.7 (L) 08/26/2022 1316   CALCIUM 8.7 11/21/2013 1153   GFRNONAA 8 (L) 08/26/2022 1316   GFRNONAA >60 11/21/2013 1153   GFRAA 15 (L) 04/26/2017 0609   GFRAA >60 11/21/2013 1153    COAGS: Lab Results  Component Value Date   INR 1.2 04/15/2022   INR 1.3 (H) 10/26/2020   INR 1.2 10/15/2020     Non-Invasive Vascular Imaging:   EX RAY of right foot ordered.  No results at  this time.   Statin:  Yes.   Beta Blocker:  No. Aspirin:  Yes.   ACEI:  No. ARB:  No. CCB use:  No Other antiplatelets/anticoagulants:  No.    ASSESSMENT/PLAN: This is a 52 y.o. female with a past medical history of ESRD (on HD), COPD/Asthma, OSA/OHS, chronic hypoxic respiratory failure, HTN,  HFpEF, T2DM, and morbid obesity who presented to the hospital with dysuria, now in the ICU with altered mental status complicated by cardiac arrest.   PLAN: Vascular surgery recommends patient's overall status improved before attempting a right lower extremity angiogram to determine blood flow.  Currently being on 2 vasopressors will interfere with results from an angiogram.  We will continue to follow normal if the patient improves we will reevaluate.   -I discussed the plan in detail with Dr. Levora Dredge MD and he agrees with the plan   Marcie Bal [facility] 08/26/2022 4:04 PM

## 2022-08-26 NOTE — Progress Notes (Signed)
Critical care note:  Date of note: 08/26/2022  Subjective: The patient refused BiPAP yesterday due to improperly fitting mask and claustrophobia and has been stable until earlier this morning around 2:05 AM when she was noted to be unresponsive with a weak pulse.  CODE BLUE was called.  She was bradycardic on monitor to 30-40.  IV atropine was given with brief responsiveness and subsequently lost her pulse.  CPR was started as well as ACLS.  She was immediately intubated without RSI.  She received 1 ampoule of IV calcium chloride and IV epinephrine 1 mg X.3, 2 g of IV magnesium sulfate and 4 A of sodium bicarbonate as well as 1 amp of D50.  She was later placed on IV norepinephrine.  Objective: Physical examination: Generally: Acutely ill morbidly obese African-American female, sedated and intubated. Vital signs: Postresuscitation BP was up to 146/99 with heart rate of 119 and pulse oximetry was 100% on 100% FiO2. Head - atraumatic, normocephalic.  Pupils - equal, round and reactive to light and accommodation. Extraocular movements are intact. No scleral icterus.  Oropharynx - moist mucous membranes and tongue. No pharyngeal erythema or exudate.  Neck - supple. No JVD. Carotid pulses 2+ bilaterally. No carotid bruits. No palpable thyromegaly or lymphadenopathy. Cardiovascular - regular rate and rhythm. Normal S1 and S2. No murmurs, gallops or rubs.  Lungs -diminished bibasilar breath sounds. Abdomen - soft and nontender. Positive bowel sounds. No palpable organomegaly or masses.  Extremities -1+ pitting edema, with no clubbing or cyanosis.  Neuro - grossly non-focal. Skin - no rashes. Breast, pelvic and rectal - deferred.  Labs and notes were reviewed.  Assessment/plan: 1.  Acute on chronic hypoxic and hypercarbic respiratory failure due to obesity hypoventilation syndrome as well as acute on chronic diastolic CHF, status post cardiac arrest, currently intubated on mechanical ventilation. -  Management was given as above. - She will be on ventilator protocol. - ICU team will take over.  2.  Metabolic acidosis. - She will be placed on IV bicarbonate drip.  3.  End-stage renal disease on hemodialysis. - Nephrology consult follow-up will be obtained as she may need CRRT.  Authorized and performed by: Valente David, MD Total critical care time:   30     minutes. Due to a high probability of clinically significant, life-threatening deterioration, the patient required my highest level of preparedness to intervene emergently and I personally spent this critical care time directly and personally managing the patient.  This critical care time included obtaining a history, examining the patient, pulse oximetry, ordering and review of studies, arranging urgent treatment with development of management plan, evaluation of patient's response to treatment, frequent reassessment, and discussions with other providers. This critical care time was performed to assess and manage the high probability of imminent, life-threatening deterioration that could result in multiorgan failure.  It was exclusive of separately billable procedures and treating other patients and teaching time.

## 2022-08-26 NOTE — Progress Notes (Signed)
[facility]  ROUNDING NOTE   Subjective:   [name] is a 52 year old African-American female with a past medical history of end-stage renal disease, patient is on Tuesday Thursday Saturday schedule, diabetes mellitus, history of pancreatitis who came to the ER with chief complaint of abdominal pain with foul smelling urine for 2 weeks.  Patient is known to our clinic and receives outpatient dialysis treatments at [facility] on a TTS schedule, supervised by Dr Thedore Mins.   Patient seen and evaluated at bedside in ICU Sedated : Propofol Ventilator: 90% FiO2 Pressors: Levo    Objective:  Vital signs in last 24 hours:  Temp:  [97 F (36.1 C)-99.5 F (37.5 C)] 99.5 F (37.5 C) (07/01 0830) Pulse Rate:  [0-97] 0 (07/01 1100) Resp:  [0-30] 18 (07/01 1400) BP: (35-153)/(16-131) 104/51 (07/01 1400) SpO2:  [94 %-100 %] 100 % (07/01 1035) Arterial Line BP: (90-124)/(36-79) 122/63 (07/01 1400) FiO2 (%):  [60 %-100 %] 90 % (07/01 0830)  Weight change: -8.277 kg Filed Weights   08/24/22 1231 08/25/22 0900 08/25/22 1356  Weight: 124.8 kg 115.1 kg 128.3 kg    Intake/Output: I/O last 3 completed shifts: In: 1213.9 [P.O.:490; I.V.:723.9] Out: 0    Intake/Output this shift:  Total I/O In: 693.1 [I.V.:693.1] Out: 240 [Emesis/NG output:240]  Physical Exam: General: Ill appearing  Head: Normocephalic, atraumatic.  Eyes: Anicteric  Lungs:  Vent  Heart: Regular rate and rhythm  Abdomen:  Soft, nontender, obese  Extremities:  No peripheral edema.  Neurologic: Sedated  Skin: Chronic right foot wound  Access: Lt AVF     Basic Metabolic Panel: Recent Labs  Lab 08/23/22 0640 08/24/22 0605 08/24/22 1849 08/25/22 0517 08/26/22 0351  NA 133* 127* 128* 127* 130*  K 3.9 5.6* 5.1 5.0 5.0  CL 101 91* 91* 91* 89*  CO2 18* 20* 15* 20* 16*  GLUCOSE 67* 66* 82 95 162*  BUN 37* 53* 48* 51* 64*  CREATININE 3.74* 5.73* 5.42* 5.80* 6.50*  CALCIUM 6.3* 7.8* 7.8* 7.8*  8.4*  MG  --   --   --   --  2.7*  PHOS  --   --   --  7.1*  --      Liver Function Tests: Recent Labs  Lab 08/22/22 0507 08/23/22 0640 08/24/22 0605 08/24/22 1849 08/25/22 0517 08/26/22 0351  AST 220* 279* 190* 173*  --  263*  ALT 93* 97* 64* 57*  --  65*  ALKPHOS 650* 648* 626* 597*  --  570*  BILITOT 3.2* 2.8* 2.0* 2.0*  --  2.5*  PROT 7.4 6.9 6.9 7.1  --  6.5  ALBUMIN 3.2* 3.0* 3.1* 3.4* 3.1* 3.0*    No results for input(s): "LIPASE", "AMYLASE" in the last 168 hours. No results for input(s): "AMMONIA" in the last 168 hours.  CBC: Recent Labs  Lab 08/23/22 0640 08/24/22 0605 08/25/22 0517 08/26/22 0351 08/26/22 1316  WBC 21.7* 19.4* 25.4* 23.0* 24.7*  NEUTROABS  --   --   --   --  21.6*  HGB 13.5 13.5 13.5 13.9 14.1  HCT 42.0 41.8 41.4 42.3 42.1  MCV 93.1 91.1 91.6 90.6 88.3  PLT 300 298 272 267 284     Cardiac Enzymes: No results for input(s): "CKTOTAL", "CKMB", "CKMBINDEX", "TROPONINI" in the last 168 hours.  BNP: Invalid input(s): "POCBNP"  CBG: Recent Labs  Lab 08/25/22 2011 08/25/22 2351 08/26/22 0236 08/26/22 0726 08/26/22 1137  GLUCAP 105* 92 63* 133* 180*  Microbiology: Results for orders placed or performed during the hospital encounter of 08/18/22  Urine Culture     Status: Abnormal   Collection Time: 08/18/22  3:07 PM   Specimen: Urine, Clean Catch  Result Value Ref Range Status   Specimen Description   Final    URINE, CLEAN CATCH Performed at [facility], [address] [place], [place] [address]    Special Requests   Final    NONE Performed at [facility], [address] [place], [place] [address]    Culture >=100,000 COLONIES/mL YEAST (A)  Final   Report Status 08/20/2022 FINAL  Final  Wet prep, genital     Status: Abnormal   Collection Time: 08/20/22  6:35 PM  Result Value Ref Range Status   Yeast Wet Prep HPF POC PRESENT (A) NONE SEEN Corrected    Comment: CORRECTED RESULTS CALLED TO: JEFF  STRENK 08/22/22 1412 SH CORRECTED ON 06/27 AT 1406: PREVIOUSLY REPORTED AS NONE SEEN    Trich, Wet Prep NONE SEEN NONE SEEN Final   Clue Cells Wet Prep HPF POC NONE SEEN NONE SEEN Final   WBC, Wet Prep HPF POC >=10 (A) <10 Final   Sperm NONE SEEN  Final    Comment: Performed at [facility], [address] [place], [place] [address]  Chlamydia/NGC rt PCR [facility] only)     Status: None   Collection Time: 08/20/22  6:35 PM   Specimen: Cervical/Vaginal swab; Genital  Result Value Ref Range Status   Specimen source GC/Chlam ENDOCERVICAL  Final   Chlamydia Tr NOT DETECTED NOT DETECTED Final   N gonorrhoeae NOT DETECTED NOT DETECTED Final    Comment: (NOTE) This CT/NG assay has not been evaluated in patients with a history of  hysterectomy. Performed at [facility], [address] [place], [place] [address]   Culture, blood (Routine X 2) w Reflex to ID Panel     Status: None (Preliminary result)   Collection Time: 08/25/22 12:59 PM   Specimen: BLOOD  Result Value Ref Range Status   Specimen Description BLOOD BLOOD RIGHT ARM RAC  Final   Special Requests   Final    BOTTLES DRAWN AEROBIC AND ANAEROBIC Blood Culture adequate volume   Culture   Final    NO GROWTH < 24 HOURS Performed at [facility], [address] [place], [place] [address]    Report Status PENDING  Incomplete  MRSA Next Gen by PCR, Nasal     Status: None   Collection Time: 08/25/22  2:01 PM   Specimen: Nasal Mucosa; Nasal Swab  Result Value Ref Range Status   MRSA by PCR Next Gen NOT DETECTED NOT DETECTED Final    Comment: (NOTE) The GeneXpert MRSA Assay (FDA approved for NASAL specimens only), is one component of a comprehensive MRSA colonization surveillance program. It is not intended to diagnose MRSA infection nor to guide or monitor treatment for MRSA infections. Test performance is not FDA approved in patients less than 49 years old. Performed at [facility], [address] [place], [place] [address]   Culture, blood (Routine X 2) w Reflex to ID Panel     Status: None (Preliminary result)   Collection Time: 08/25/22  3:59 PM   Specimen: BLOOD  Result Value Ref Range Status   Specimen Description BLOOD PICC LINE  Final   Special Requests   Final    BOTTLES DRAWN AEROBIC AND ANAEROBIC Blood Culture adequate volume  Culture   Final    NO GROWTH < 24 HOURS Performed at [facility], [address] [place], [place] [address]    Report Status PENDING  Incomplete    Coagulation Studies: No results for input(s): "LABPROT", "INR" in the last 72 hours.  Urinalysis: No results for input(s): "COLORURINE", "LABSPEC", "PHURINE", "GLUCOSEU", "HGBUR", "BILIRUBINUR", "KETONESUR", "PROTEINUR", "UROBILINOGEN", "NITRITE", "LEUKOCYTESUR" in the last 72 hours.  Invalid input(s): "APPERANCEUR"     Imaging: DG Chest Port 1 View  Result Date: 08/26/2022 CLINICAL DATA:  Central line placement EXAM: PORTABLE CHEST 1 VIEW COMPARISON:  Previous studies including the examination done earlier today FINDINGS: Transverse diameter of heart is increased. Central pulmonary vessels are prominent. Apparent shift of mediastinum to the right may be due to rotation. Tip of endotracheal tube is 4 cm above the carina. Enteric tube is noted traversing the esophagus. There is interval removal of right IJ central venous catheter. Linear densities are seen in lower lung fields, more so on the right side. Lateral CP angles are clear. There is no pneumothorax. IMPRESSION: Cardiomegaly. Linear densities are seen in the lower lung fields suggesting subsegmental atelectasis. Central pulmonary vessels are prominent without signs of pulmonary edema. There is interval removal of right IJ vascular catheter. Electronically Signed   By: Ernie Avena M.D.   On: 08/26/2022 13:10   CARDIAC CATHETERIZATION  Result Date: 08/26/2022 See surgical note for result.  DG Chest 1  View  Result Date: 08/26/2022 CLINICAL DATA:  Check endotracheal tube placement EXAM: PORTABLE CHEST 1 VIEW COMPARISON:  08/25/2022 FINDINGS: Cardiac shadow is enlarged. Endotracheal tube is noted 3.4 cm above the carina. Right jugular central line is noted coursing across the midline similar to that seen on the prior exam. The tip likely lies within the left innominate vein. Gastric catheter extends towards the stomach. Proximal side port lies at the gastroesophageal junction. This should be advanced deeper into the stomach. Mild central vascular congestion is noted. IMPRESSION: Changes consistent with mild CHF. Tubes and lines as described above. The gastric catheter should be advanced deeper into the stomach. Electronically Signed   By: Alcide Clever M.D.   On: 08/26/2022 03:21   DG Chest Port 1 View  Result Date: 08/25/2022 CLINICAL DATA:  Readjustment of central venous catheter EXAM: PORTABLE CHEST 1 VIEW COMPARISON:  Chest x-ray August 25, 2022 at 1531 hours FINDINGS: Interval retraction of the right central venous catheter, with tip projecting near the left clavicle. Unchanged cardiomegaly and mediastinal contours. Bilateral reticular pulmonary opacities. Moderate right pleural effusion. The visualized skeletal structures are unremarkable. IMPRESSION: 1. Interval retraction of the right central venous catheter, likely within the left subclavian vein. Upon discussion with the ordering provider, venous return was noted during line placement. 2. Pulmonary edema and moderate right pleural effusion. These findings were discussed with Eli Hose by Jacob Moores at 4:06 pm on 08/25/2022. Electronically Signed   By: Jacob Moores M.D.   On: 08/25/2022 16:06   DG Chest Port 1 View  Result Date: 08/25/2022 CLINICAL DATA:  Central venous catheter placement EXAM: PORTABLE CHEST 1 VIEW COMPARISON:  CT chest PE protocol December 15, 2014 FINDINGS: A right approach central venous catheter is seen crossing the  mediastinum and terminates near the left hilum. On review of prior CT PE protocol, a left-sided SVC is not present. Unchanged cardiomegaly and mediastinal contours. Low lung volumes with bronchovascular crowding. Bilateral vascular prominence and reticular pulmonary opacities. Moderate right pleural effusion. The visualized skeletal structures are unremarkable.  IMPRESSION: 1. A right approach central venous catheter crosses the mediastinum and terminates near the left hilum. Differential considerations include malpositioning within an accessory vein arising from the left brachiocephalic vein versus arterial placement. Upon discussion with ordering provider, venous return was noted during line placement. However, consider correlation with blood gas. 2. Pulmonary edema and moderate right pleural effusion. These findings were discussed with Eli Hose by Jacob Moores at 4:03 pm on 08/25/2022. Electronically Signed   By: Jacob Moores M.D.   On: 08/25/2022 16:04     Medications:    anticoagulant sodium citrate     cefTRIAXone (ROCEPHIN)  IV 2 g (08/24/22 1335)   epinephrine Stopped (08/26/22 0855)   feeding supplement (VITAL AF 1.2 CAL)     norepinephrine (LEVOPHED) Adult infusion 20 mcg/min (08/26/22 1419)   prismasol BGK 2/2.5 dialysis solution     prismasol BGK 2/2.5 replacement solution     prismasol BGK 2/2.5 replacement solution     propofol (DIPRIVAN) infusion 20.005 mcg/kg/min (08/26/22 1419)    arformoterol  15 mcg Nebulization Q12H   aspirin EC  81 mg Oral Daily   budesonide  0.5 mg Nebulization BID   Chlorhexidine Gluconate Cloth  6 each Topical Daily   cholecalciferol  1,000 Units Oral Daily   docusate  100 mg Per Tube BID   doxycycline  100 mg Oral Q12H   EPINEPHrine       escitalopram  10 mg Oral Daily   etomidate       fluconazole  100 mg Oral Daily   free water  30 mL Per Tube Q4H   insulin aspart  0-9 Units Subcutaneous Q4H   metroNIDAZOLE  500 mg Oral Q12H    montelukast  10 mg Oral QHS   multivitamin  1 tablet Per Tube QHS   [START ON 08/27/2022] nutrition supplement (JUVEN)  1 packet Per Tube BID BM   mouth rinse  15 mL Mouth Rinse Q2H   pantoprazole  40 mg Oral Daily   polyethylene glycol  17 g Per Tube Daily   rocuronium bromide       sevelamer carbonate  2.4 g Oral TID WC   sodium bicarbonate       sodium bicarbonate       acetaminophen, albuterol, anticoagulant sodium citrate, bisacodyl, EPINEPHrine, etomidate, fentaNYL (SUBLIMAZE) injection, heparin, heparin, lidocaine (PF), lidocaine-prilocaine, naLOXone (NARCAN)  injection, ondansetron **OR** ondansetron (ZOFRAN) IV, mouth rinse, oxybutynin, pentafluoroprop-tetrafluoroeth, rocuronium bromide, sodium bicarbonate, sodium bicarbonate, sodium chloride  Assessment/ Plan:  [name] is a 52 y.o.  female with a past medical history of end-stage renal disease, patient is on Tuesday Thursday Saturday schedule, diabetes mellitus, history of pancreatitis who came to the ER with chief complaint of abdominal pain with foul smelling urine for 2 weeks.  [facility] TTS Left AVF 127.5kg   End stage renal disease on hemodialysis. Unstable presentation, presence of pressors. Orders paced for CRRT.   2. Anemia of chronic kidney disease Lab Results  Component Value Date   HGB 14.1 08/26/2022    Hgb at goal.   3. Secondary Hyperparathyroidism: with outpatient labs: PTH 2361, phosphorus 9.0, calcium 8.4 on 08/06/22.   Lab Results  Component Value Date   PTH 134 (H) 04/21/2017   CALCIUM 8.4 (L) 08/26/2022   CAION 0.81 (LL) 10/26/2020   PHOS 7.1 (H) 08/25/2022    Phosphorus remains elevated. Continue cholecalciferol and sevelamer with meals  4. Diabetes mellitus type II with chronic kidney disease/renal manifestations:  noninsulin dependent. Most recent hemoglobin A1c is 7.8 on 08/18/22.  Primary team to manage sliding scale insulin  5. Hyperkalemia, potassium 5.0.   LOS:  8 Anthon Harpole 7/1/20242:22 PM

## 2022-08-26 NOTE — Progress Notes (Addendum)
Brief Progress Note:  On taking over service this morning, I noted a central line crossing over the midline on the patient's chest xray. I asked the patient's nurse to transduce the central line and noted an arterial waveform. I have contacted Dr. Gilda Crease from the vascular surgery team to evaluate the central line for possibly being arterial and he will be taking the patient for line removal and percutaneous closure. [name] husband has been updated at the bedside.    Raechel Chute, MD [facility] 08/26/2022 9:23 AM

## 2022-08-26 NOTE — Progress Notes (Signed)
*  PRELIMINARY RESULTS* Echocardiogram 2D Echocardiogram has been performed.  [name] 08/26/2022, 2:19 PM

## 2022-08-26 NOTE — Progress Notes (Signed)
Pt transported to vascular lab and back to ICU room 9 without complications.

## 2022-08-26 NOTE — Procedures (Signed)
Arterial Catheter Insertion Procedure Note  [name]  [id number]  1970/06/04  Date:08/26/22  Time:5:12 AM   Provider Performing: Loraine Leriche   Procedure: Insertion of Arterial Line (40981) with US guidance (19147)   Indication(s) Blood pressure monitoring and/or need for frequent ABGs  Consent Unable to obtain consent due to emergent nature of procedure.  Anesthesia None  Time Out Verified patient identification, verified procedure, site/side was marked, verified correct patient position, special equipment/implants available, medications/allergies/relevant history reviewed, required imaging and test results available.  Sterile Technique Maximal sterile technique including full sterile barrier drape, hand hygiene, sterile gown, sterile gloves, mask, hair covering, sterile ultrasound probe cover (if used).  Procedure Description Area of catheter insertion was cleaned with chlorhexidine and draped in sterile fashion. With real-time ultrasound guidance an arterial catheter was placed into the left femoral artery.  Appropriate arterial tracings confirmed on monitor.    Complications/Tolerance None; patient tolerated the procedure well.  EBL Minimal  Specimen(s) None   [name] Silversmith, DNP, CCRN, FNP-C, AGACNP-BC Acute Care & Family Nurse Practitioner  [facility]  See Amion for personal pager PCCM on call pager [phone] until 7 am

## 2022-08-26 NOTE — Procedures (Addendum)
Intubation Procedure Note  KATRINA VATH  [id number]  Nov 11, 1970  Date:08/26/22  Time:5:14 AM   Provider Performing:Torry Adamczak A Nitesh Pitstick   Procedure: Intubation (31500)  Indication(s) Respiratory Failure  Consent Unable to obtain consent due to emergent nature of procedure.  Anesthesia None used  Time Out Verified patient identification, verified procedure, site/side was marked, verified correct patient position, special equipment/implants available, medications/allergies/relevant history reviewed, required imaging and test results available.  Sterile Technique Usual hand hygeine, masks, and gloves were used  Procedure Description Patient positioned in bed supine.  Sedation given as noted above.  Patient was intubated with endotracheal tube using Glidescope.  View was Grade 1 full glottis .  Number of attempts was 1.  Colorimetric CO2 detector was consistent with tracheal placement.  Complications/Tolerance None; patient tolerated the procedure well. Chest X-ray is ordered to verify placement.  EBL Minimal  Specimen(s) None   [name], DNP, CCRN, FNP-C, AGACNP-BC Acute Care & Family Nurse Practitioner  [facility]  See Amion for personal pager PCCM on call pager [phone] until 7 am

## 2022-08-26 NOTE — Progress Notes (Signed)
Family (daughter) notified of change in condition

## 2022-08-26 DEATH — deceased

## 2022-08-27 ENCOUNTER — Encounter: Payer: Self-pay | Admitting: Vascular Surgery

## 2022-08-27 ENCOUNTER — Inpatient Hospital Stay: Payer: 59

## 2022-08-27 DIAGNOSIS — N186 End stage renal disease: Secondary | ICD-10-CM | POA: Diagnosis not present

## 2022-08-27 DIAGNOSIS — Z992 Dependence on renal dialysis: Secondary | ICD-10-CM | POA: Diagnosis not present

## 2022-08-27 DIAGNOSIS — L97519 Non-pressure chronic ulcer of other part of right foot with unspecified severity: Secondary | ICD-10-CM

## 2022-08-27 DIAGNOSIS — N390 Urinary tract infection, site not specified: Secondary | ICD-10-CM

## 2022-08-27 DIAGNOSIS — T82898A Other specified complication of vascular prosthetic devices, implants and grafts, initial encounter: Secondary | ICD-10-CM | POA: Diagnosis not present

## 2022-08-27 DIAGNOSIS — G9341 Metabolic encephalopathy: Secondary | ICD-10-CM | POA: Diagnosis not present

## 2022-08-27 DIAGNOSIS — J9621 Acute and chronic respiratory failure with hypoxia: Secondary | ICD-10-CM | POA: Diagnosis not present

## 2022-08-27 DIAGNOSIS — I739 Peripheral vascular disease, unspecified: Secondary | ICD-10-CM

## 2022-08-27 LAB — BLOOD GAS, ARTERIAL
Acid-base deficit: 12.6 mmol/L — ABNORMAL HIGH (ref 0.0–2.0)
Acid-base deficit: 4.8 mmol/L — ABNORMAL HIGH (ref 0.0–2.0)
Bicarbonate: 21.1 mmol/L (ref 20.0–28.0)
FIO2: 10 %
FIO2: 60 %
MECHVT: 530 mL
Mechanical Rate: 18
O2 Saturation: 95.9 %
O2 Saturation: 99 %
PEEP: 8 cmH2O
Patient temperature: 37
Patient temperature: 37
pCO2 arterial: 41 mmHg (ref 32–48)
pH, Arterial: 7.1 — CL (ref 7.35–7.45)
pH, Arterial: 7.32 — ABNORMAL LOW (ref 7.35–7.45)
pO2, Arterial: 91 mmHg (ref 83–108)
pO2, Arterial: 109 mmHg — ABNORMAL HIGH (ref 83–108)

## 2022-08-27 LAB — CULTURE, RESPIRATORY W GRAM STAIN

## 2022-08-27 LAB — RENAL FUNCTION PANEL
Albumin: 2.9 g/dL — ABNORMAL LOW (ref 3.5–5.0)
Albumin: 2.8 g/dL — ABNORMAL LOW (ref 3.5–5.0)
Anion gap: 20 — ABNORMAL HIGH (ref 5–15)
Anion gap: 15 (ref 5–15)
BUN: 49 mg/dL — ABNORMAL HIGH (ref 6–20)
BUN: 44 mg/dL — ABNORMAL HIGH (ref 6–20)
CO2: 18 mmol/L — ABNORMAL LOW (ref 22–32)
CO2: 18 mmol/L — ABNORMAL LOW (ref 22–32)
Calcium: 8.5 mg/dL — ABNORMAL LOW (ref 8.9–10.3)
Calcium: 8 mg/dL — ABNORMAL LOW (ref 8.9–10.3)
Chloride: 91 mmol/L — ABNORMAL LOW (ref 98–111)
Chloride: 96 mmol/L — ABNORMAL LOW (ref 98–111)
Creatinine, Ser: 4.4 mg/dL — ABNORMAL HIGH (ref 0.44–1.00)
Creatinine, Ser: 3.55 mg/dL — ABNORMAL HIGH (ref 0.44–1.00)
GFR, Estimated: 12 mL/min — ABNORMAL LOW (ref >60)
GFR, Estimated: 15 mL/min — ABNORMAL LOW (ref >60)
Glucose, Bld: 238 mg/dL — ABNORMAL HIGH (ref 70–99)
Glucose, Bld: 274 mg/dL — ABNORMAL HIGH (ref 70–99)
Phosphorus: 5 mg/dL — ABNORMAL HIGH (ref 2.5–4.6)
Phosphorus: 3.6 mg/dL (ref 2.5–4.6)
Potassium: 4.2 mmol/L (ref 3.5–5.1)
Potassium: 3.7 mmol/L (ref 3.5–5.1)
Sodium: 129 mmol/L — ABNORMAL LOW (ref 135–145)
Sodium: 129 mmol/L — ABNORMAL LOW (ref 135–145)

## 2022-08-27 LAB — CULTURE, BLOOD (ROUTINE X 2)

## 2022-08-27 LAB — GLUCOSE, CAPILLARY
Glucose-Capillary: 221 mg/dL — ABNORMAL HIGH (ref 70–99)
Glucose-Capillary: 221 mg/dL — ABNORMAL HIGH (ref 70–99)
Glucose-Capillary: 268 mg/dL — ABNORMAL HIGH (ref 70–99)
Glucose-Capillary: 286 mg/dL — ABNORMAL HIGH (ref 70–99)
Glucose-Capillary: 256 mg/dL — ABNORMAL HIGH (ref 70–99)
Glucose-Capillary: 235 mg/dL — ABNORMAL HIGH (ref 70–99)

## 2022-08-27 LAB — LACTIC ACID, PLASMA: Lactic Acid, Venous: 2.5 mmol/L (ref 0.5–1.9)

## 2022-08-27 LAB — CBC WITH DIFFERENTIAL/PLATELET
Abs Immature Granulocytes: 0.68 10*3/uL — ABNORMAL HIGH (ref 0.00–0.07)
Basophils Absolute: 0.1 10*3/uL (ref 0.0–0.1)
Basophils Relative: 0 %
Eosinophils Absolute: 0 10*3/uL (ref 0.0–0.5)
Eosinophils Relative: 0 %
HCT: 41 % (ref 36.0–46.0)
Hemoglobin: 13.8 g/dL (ref 12.0–15.0)
Immature Granulocytes: 3 %
Lymphocytes Relative: 5 %
Lymphs Abs: 1.1 10*3/uL (ref 0.7–4.0)
MCH: 29.6 pg (ref 26.0–34.0)
MCHC: 33.7 g/dL (ref 30.0–36.0)
MCV: 88 fL (ref 80.0–100.0)
Monocytes Absolute: 2.1 10*3/uL — ABNORMAL HIGH (ref 0.1–1.0)
Monocytes Relative: 10 %
Neutro Abs: 16.5 10*3/uL — ABNORMAL HIGH (ref 1.7–7.7)
Neutrophils Relative %: 82 %
Platelets: 259 10*3/uL (ref 150–400)
RBC: 4.66 MIL/uL (ref 3.87–5.11)
RDW: 19.7 % — ABNORMAL HIGH (ref 11.5–15.5)
Smear Review: NORMAL
WBC: 20.4 10*3/uL — ABNORMAL HIGH (ref 4.0–10.5)
nRBC: 10.6 % — ABNORMAL HIGH (ref 0.0–0.2)

## 2022-08-27 LAB — PATHOLOGIST SMEAR REVIEW

## 2022-08-27 LAB — MAGNESIUM: Magnesium: 2.2 mg/dL (ref 1.7–2.4)

## 2022-08-27 LAB — TRIGLYCERIDES: Triglycerides: 244 mg/dL — ABNORMAL HIGH (ref <150)

## 2022-08-27 MED ORDER — ASPIRIN 81 MG PO CHEW
81.0000 mg | CHEWABLE_TABLET | Freq: Every day | ORAL | Status: DC
  Administered 2022-08-28 – 2022-09-01 (×5): 81 mg
  Filled 2022-08-27 (×5): qty 1

## 2022-08-27 MED ORDER — SODIUM CHLORIDE 0.9 % IV SOLN
2.0000 g | Freq: Two times a day (BID) | INTRAVENOUS | Status: DC
  Administered 2022-08-27 (×2): 2 g via INTRAVENOUS
  Filled 2022-08-27 (×3): qty 12.5

## 2022-08-27 MED ORDER — INSULIN ASPART 100 UNIT/ML IJ SOLN
0.0000 [IU] | INTRAMUSCULAR | Status: DC
  Administered 2022-08-27: 7 [IU] via SUBCUTANEOUS
  Administered 2022-08-27: 11 [IU] via SUBCUTANEOUS
  Administered 2022-08-28 (×2): 4 [IU] via SUBCUTANEOUS
  Administered 2022-08-28 (×2): 7 [IU] via SUBCUTANEOUS
  Administered 2022-08-28: 11 [IU] via SUBCUTANEOUS
  Administered 2022-08-28: 7 [IU] via SUBCUTANEOUS
  Administered 2022-08-29 (×4): 11 [IU] via SUBCUTANEOUS
  Administered 2022-08-29 – 2022-08-30 (×5): 7 [IU] via SUBCUTANEOUS
  Administered 2022-08-30 (×2): 4 [IU] via SUBCUTANEOUS
  Administered 2022-08-30: 11 [IU] via SUBCUTANEOUS
  Administered 2022-08-31: 7 [IU] via SUBCUTANEOUS
  Administered 2022-08-31 (×3): 4 [IU] via SUBCUTANEOUS
  Administered 2022-08-31: 7 [IU] via SUBCUTANEOUS
  Administered 2022-08-31: 4 [IU] via SUBCUTANEOUS
  Administered 2022-09-01 (×2): 7 [IU] via SUBCUTANEOUS
  Administered 2022-09-01: 4 [IU] via SUBCUTANEOUS
  Filled 2022-08-27 (×27): qty 1

## 2022-08-27 MED ORDER — ONDANSETRON HCL 4 MG/2ML IJ SOLN: 4.0000 mg | Freq: Four times a day (QID) | INTRAMUSCULAR | Status: DC | PRN

## 2022-08-27 MED ORDER — VITAMIN D 25 MCG (1000 UNIT) PO TABS
1000.0000 [IU] | ORAL_TABLET | Freq: Every day | ORAL | Status: DC
  Administered 2022-08-28 – 2022-09-01 (×4): 1000 [IU]
  Filled 2022-08-27 (×5): qty 1

## 2022-08-27 MED ORDER — ONDANSETRON HCL 4 MG PO TABS: 4.0000 mg | ORAL_TABLET | Freq: Four times a day (QID) | ORAL | Status: DC | PRN

## 2022-08-27 NOTE — Progress Notes (Signed)
Obtained verbal order from McGrath, NP to advance OG tube to 70 cm. OG tube advanced with no difficulty. OG is currently connected to LWIS.

## 2022-08-27 NOTE — Progress Notes (Signed)
Transported patient to and from MRI on transport vent.

## 2022-08-27 NOTE — Progress Notes (Signed)
Spoke with Jonny Ruiz from MRI, he stated that the soonest they can bring patient down to MRI is at 9:00 pm. Notified respiratory and I will inform oncoming nurse. Continue to assess.

## 2022-08-27 NOTE — Progress Notes (Signed)
CRRT restarted with no complications. Continue to monitor.

## 2022-08-27 NOTE — Progress Notes (Signed)
Updated pts children (2 sons/1 daughter) at bedside regarding CT Head results and current plan of care.  Also, informed pts children MRI Brain scheduled for tonight at 2100.  Also, notified pts husbands via telephone.  Family meeting scheduled with Dr. Aundria Rud pending on 07/3 at 1730.  They were appreciate to receive update.  Zada Girt, AGNP  Pulmonary/Critical Care Pager [phone] (please enter 7 digits) PCCM Consult Pager [phone] (please enter 7 digits)

## 2022-08-27 NOTE — Progress Notes (Signed)
[facility]  ROUNDING NOTE   Subjective:   [name] is a 52 year old African-American female with a past medical history of end-stage renal disease, patient is on Tuesday Thursday Saturday schedule, diabetes mellitus, history of pancreatitis who came to the ER with chief complaint of abdominal pain with foul smelling urine for 2 weeks.  Patient is known to our clinic and receives outpatient dialysis treatments at [facility] on a TTS schedule, supervised by Dr Thedore Mins.   Patient seen and evaluated at bedside in ICU Sedated : off Ventilator: 40% FiO2, weaning trials Pressors: Levo, Vaso Tube feeds 49ml/hr  CRRT in place, net even  Objective:  Vital signs in last 24 hours:  Temp:  [99.1 F (37.3 C)-101.3 F (38.5 C)] 99.1 F (37.3 C) (07/02 1000) Pulse Rate:  [0-101] 101 (07/01 1519) Resp:  [0-25] 22 (07/02 1000) BP: (64-121)/(25-75) 87/61 (07/02 1000) SpO2:  [94 %-100 %] 95 % (07/02 0745) Arterial Line BP: (102-128)/(51-70) 112/58 (07/02 1000) FiO2 (%):  [40 %-80 %] 40 % (07/02 0745) Weight:  [135 kg] 135 kg (07/02 0500)  Weight change: 19.9 kg Filed Weights   08/25/22 1356 08/26/22 0700 08/27/22 0500  Weight: 128.3 kg 127.9 kg 135 kg    Intake/Output: I/O last 3 completed shifts: In: 3900.6 [P.O.:240; I.V.:2436.5; NG/GT:824; IV Piggyback:400.1] Out: 2488 [Emesis/NG output:240]   Intake/Output this shift:  Total I/O In: 370 [I.V.:70; NG/GT:200; IV Piggyback:100] Out: 375   Physical Exam: General: Ill appearing  Head: Normocephalic, atraumatic.  Eyes: Anicteric  Lungs:  Vent  Heart: Regular rate and rhythm  Abdomen:  Soft, nontender, obese  Extremities:  No peripheral edema.  Neurologic: Somnolent  Skin: Chronic right foot wound  Access: Lt AVF (no bruit/thrill)    Basic Metabolic Panel: Recent Labs  Lab 08/25/22 0517 08/26/22 0351 08/26/22 1316 08/26/22 1721 08/27/22 0417  NA 127* 130* 130* 131* 129*  K 5.0 5.0 5.0 5.2* 4.2   CL 91* 89* 90* 88* 91*  CO2 20* 16* 17* 22 18*  GLUCOSE 95 162* 221* 203* 238*  BUN 51* 64* 66* 64* 49*  CREATININE 5.80* 6.50* 6.26* 6.23* 4.40*  CALCIUM 7.8* 8.4* 7.7* 8.6* 8.5*  MG  --  2.7*  --   --  2.2  PHOS 7.1*  --   --  7.6* 5.0*     Liver Function Tests: Recent Labs  Lab 08/23/22 0640 08/24/22 0605 08/24/22 1849 08/25/22 0517 08/26/22 0351 08/26/22 1316 08/26/22 1721 08/27/22 0417  AST 279* 190* 173*  --  263* 329*  --   --   ALT 97* 64* 57*  --  65* 73*  --   --   ALKPHOS 648* 626* 597*  --  570* 547*  --   --   BILITOT 2.8* 2.0* 2.0*  --  2.5* 2.5*  --   --   PROT 6.9 6.9 7.1  --  6.5 6.5  --   --   ALBUMIN 3.0* 3.1* 3.4* 3.1* 3.0* 2.9* 3.2* 2.9*    No results for input(s): "LIPASE", "AMYLASE" in the last 168 hours. No results for input(s): "AMMONIA" in the last 168 hours.  CBC: Recent Labs  Lab 08/24/22 0605 08/25/22 0517 08/26/22 0351 08/26/22 1316 08/27/22 0417  WBC 19.4* 25.4* 23.0* 24.7* 20.4*  NEUTROABS  --   --   --  21.6* 16.5*  HGB 13.5 13.5 13.9 14.1 13.8  HCT 41.8 41.4 42.3 42.1 41.0  MCV 91.1 91.6 90.6 88.3 88.0  PLT 298  272 267 284 259     Cardiac Enzymes: No results for input(s): "CKTOTAL", "CKMB", "CKMBINDEX", "TROPONINI" in the last 168 hours.  BNP: Invalid input(s): "POCBNP"  CBG: Recent Labs  Lab 08/26/22 1627 08/26/22 2043 08/26/22 2335 08/27/22 0435 08/27/22 0758  GLUCAP 197* 175* 176* 221* 221*     Microbiology: Results for orders placed or performed during the hospital encounter of 08/18/22  Urine Culture     Status: Abnormal   Collection Time: 08/18/22  3:07 PM   Specimen: Urine, Clean Catch  Result Value Ref Range Status   Specimen Description   Final    URINE, CLEAN CATCH Performed at [facility], [address], [place], [place] [address]    Special Requests   Final    NONE Performed at [facility], [address] [place], [place] [address]    Culture >=100,000 COLONIES/mL  YEAST (A)  Final   Report Status 08/20/2022 FINAL  Final  Wet prep, genital     Status: Abnormal   Collection Time: 08/20/22  6:35 PM  Result Value Ref Range Status   Yeast Wet Prep HPF POC PRESENT (A) NONE SEEN Corrected    Comment: CORRECTED RESULTS CALLED TO: JEFF STRENK 08/22/22 1412 SH CORRECTED ON 06/27 AT 1406: PREVIOUSLY REPORTED AS NONE SEEN    Trich, Wet Prep NONE SEEN NONE SEEN Final   Clue Cells Wet Prep HPF POC NONE SEEN NONE SEEN Final   WBC, Wet Prep HPF POC >=10 (A) <10 Final   Sperm NONE SEEN  Final    Comment: Performed at [facility], [address] [place], [place] [address]  Chlamydia/NGC rt PCR [facility] only)     Status: None   Collection Time: 08/20/22  6:35 PM   Specimen: Cervical/Vaginal swab; Genital  Result Value Ref Range Status   Specimen source GC/Chlam ENDOCERVICAL  Final   Chlamydia Tr NOT DETECTED NOT DETECTED Final   N gonorrhoeae NOT DETECTED NOT DETECTED Final    Comment: (NOTE) This CT/NG assay has not been evaluated in patients with a history of  hysterectomy. Performed at [facility], [address] [place], [place] [address]   Culture, blood (Routine X 2) w Reflex to ID Panel     Status: None (Preliminary result)   Collection Time: 08/25/22 12:59 PM   Specimen: BLOOD  Result Value Ref Range Status   Specimen Description BLOOD BLOOD RIGHT ARM RAC  Final   Special Requests   Final    BOTTLES DRAWN AEROBIC AND ANAEROBIC Blood Culture adequate volume   Culture   Final    NO GROWTH < 24 HOURS Performed at [facility], [address] [place], [place] [address]    Report Status PENDING  Incomplete  MRSA Next Gen by PCR, Nasal     Status: None   Collection Time: 08/25/22  2:01 PM   Specimen: Nasal Mucosa; Nasal Swab  Result Value Ref Range Status   MRSA by PCR Next Gen NOT DETECTED NOT DETECTED Final    Comment: (NOTE) The GeneXpert MRSA Assay (FDA approved for NASAL specimens only), is one component of a  comprehensive MRSA colonization surveillance program. It is not intended to diagnose MRSA infection nor to guide or monitor treatment for MRSA infections. Test performance is not FDA approved in patients less than 60 years old. Performed at [facility], [address] [place], [place] [address]   Culture, blood (Routine X 2) w Reflex to ID Panel  Status: None (Preliminary result)   Collection Time: 08/25/22  3:59 PM   Specimen: BLOOD  Result Value Ref Range Status   Specimen Description BLOOD PICC LINE  Final   Special Requests   Final    BOTTLES DRAWN AEROBIC AND ANAEROBIC Blood Culture adequate volume   Culture   Final    NO GROWTH < 24 HOURS Performed at [facility], [address] [place], [place] [address]    Report Status PENDING  Incomplete  Culture, Respiratory w Gram Stain     Status: None (Preliminary result)   Collection Time: 08/26/22  1:28 PM   Specimen: Tracheal Aspirate; Respiratory  Result Value Ref Range Status   Specimen Description   Final    TRACHEAL ASPIRATE Performed at [facility], [address] [place], [place] [address]    Special Requests   Final    NONE Performed at [facility], [address] [place], [place] [address]    Gram Stain   Final    ABUNDANT WBC PRESENT, PREDOMINANTLY PMN RARE BUDDING YEAST SEEN RARE GRAM POSITIVE COCCI IN PAIRS Performed at [facility], [address] [place], [place] [address]    Culture PENDING  Incomplete   Report Status PENDING  Incomplete  MRSA Next Gen by PCR, Nasal     Status: None   Collection Time: 08/26/22  5:21 PM   Specimen: Nasal Mucosa; Nasal Swab  Result Value Ref Range Status   MRSA by PCR Next Gen NOT DETECTED NOT DETECTED Final    Comment: (NOTE) The GeneXpert MRSA Assay (FDA approved for NASAL specimens only), is one component of a comprehensive MRSA colonization surveillance program. It is not intended to diagnose MRSA infection nor to  guide or monitor treatment for MRSA infections. Test performance is not FDA approved in patients less than 48 years old. Performed at [facility], [address] [place], [place] [address]     Coagulation Studies: No results for input(s): "LABPROT", "INR" in the last 72 hours.  Urinalysis: No results for input(s): "COLORURINE", "LABSPEC", "PHURINE", "GLUCOSEU", "HGBUR", "BILIRUBINUR", "KETONESUR", "PROTEINUR", "UROBILINOGEN", "NITRITE", "LEUKOCYTESUR" in the last 72 hours.  Invalid input(s): "APPERANCEUR"     Imaging: DG Foot Complete Right  Result Date: 08/26/2022 CLINICAL DATA:  Osteomyelitis. EXAM: RIGHT FOOT COMPLETE - 3+ VIEW COMPARISON:  Right foot radiographs 04/15/2022, MRI right forefoot 04/24/2022 FINDINGS: There is diffuse decreased bone mineralization. Redemonstration of amputation of the fifth digit to the distal metatarsal shaft, unchanged. Sharp surgical margin. There is again high-grade attenuation of the fourth greater than third toe soft tissues, possible ulceration of the fourth toe lateral to the interphalangeal joints. Overlap of the proximal phalanx and the distal metatarsal limits evaluation, however there may be progressive erosion of the remaining distal aspect of the fourth metatarsal compared to 04/15/2022. Similarly, within the limitation of overlap of bones at the third metatarsophalangeal joint, there may be progressive bone loss at the third metatarsophalangeal joint. Chronic irregularity and erosion at the second metatarsal head and proximal phalanx of the third digit is not definitively increased. Chronic irregularity at the great toe metatarsophalangeal joint is not definitely changed. There does appear to be progressive transverse bone loss and a possible nonunited fracture at the distal aspect of the proximal phalanx of the great toe. High-grade vascular calcifications. High-grade plantar forefoot soft tissue ulcerations are again noted. No frank  soft tissue air. IMPRESSION: 1. Redemonstration of high-grade ulceration throughout the plantar aspect of the distal  forefoot and the lateral aspect of the fourth greater than third toe soft tissues. 2. Possible mild progressive erosion of the distal aspect of the remaining fourth metatarsal and the third metatarsophalangeal joint compared to 04/15/2022. 3. Progressive transverse dimension bone loss and possible nonunited fracture at the distal aspect of the proximal phalanx of the great toe. Electronically Signed   By: Neita Garnet M.D.   On: 08/26/2022 16:56   ECHOCARDIOGRAM COMPLETE  Result Date: 08/26/2022    ECHOCARDIOGRAM REPORT   Patient Name:   [name] Lea Date of Exam: 08/26/2022 Medical Rec #:  [id number]       Height:       64.5 in Accession #:    [id number]      Weight:       282.8 lb Date of Birth:  [birth year]        BSA:          2.279 m Patient Age:    52 years        BP:           92/74 mmHg Patient Gender: F               HR:           94 bpm. Exam Location:  [facility] Procedure: 2D Echo, Cardiac Doppler and Color Doppler STAT ECHO Indications:     Cardiac Arrest I46.9  History:         Patient has prior history of Echocardiogram examinations, most                  recent 09/20/2021. Risk Factors:Hypertension and Diabetes.  Sonographer:     Cristela Blue Referring Phys:  [id number] KHABIB DGAYLI Diagnosing Phys: Lorine Bears MD  Sonographer Comments: Echo performed with patient supine and on artificial respirator and Technically challenging study due to limited acoustic windows. IMPRESSIONS  1. Left ventricular ejection fraction, by estimation, is 55 to 60%. The left ventricle has normal function. Left ventricular endocardial border not optimally defined to evaluate regional wall motion. There is mild left ventricular hypertrophy. Left ventricular diastolic parameters are indeterminate.  2. Right ventricular systolic function is moderately reduced. The right ventricular size is moderately enlarged. There  is severely elevated pulmonary artery systolic pressure.  3. Left atrial size was mildly dilated.  4. Right atrial size was severely dilated.  5. The mitral valve is abnormal. No evidence of mitral valve regurgitation. Moderate to severe mitral stenosis. The mean mitral valve gradient is 10.0 mmHg. Severe mitral annular calcification.  6. The aortic valve is calcified. Aortic valve regurgitation is not visualized. No aortic stenosis is present by gradient but could be under-estimated.  7. The inferior vena cava is dilated in size with <50% respiratory variability, suggesting right atrial pressure of 15 mmHg.  8. Anterior mitral valve leaflet is severely calcified and could be cuasing some degree of LVOT obstruction but no significant gradient was measured.  9. Challenging image quality. FINDINGS  Left Ventricle: Left ventricular ejection fraction, by estimation, is 55 to 60%. The left ventricle has normal function. Left ventricular endocardial border not optimally defined to evaluate regional wall motion. The left ventricular internal cavity size was normal in size. There is mild left ventricular hypertrophy. Left ventricular diastolic parameters are indeterminate. Right Ventricle: The right ventricular size is moderately enlarged. No increase in right ventricular wall thickness. Right ventricular systolic function is moderately reduced. There is severely elevated pulmonary artery systolic pressure. The tricuspid regurgitant velocity is  3.72 m/s, and with an assumed right atrial pressure of 15 mmHg, the estimated right ventricular systolic pressure is 70.4 mmHg. Left Atrium: Left atrial size was mildly dilated. Right Atrium: Right atrial size was severely dilated. Pericardium: There is no evidence of pericardial effusion. Mitral Valve: The mitral valve is abnormal. There is moderate thickening of the mitral valve leaflet(s). There is severe calcification of the mitral valve leaflet(s). Severe mitral annular  calcification. No evidence of mitral valve regurgitation. Moderate to severe mitral valve stenosis. MV peak gradient, 21.9 mmHg. The mean mitral valve gradient is 10.0 mmHg. Tricuspid Valve: The tricuspid valve is normal in structure. Tricuspid valve regurgitation is mild . No evidence of tricuspid stenosis. Aortic Valve: The aortic valve is calcified. Aortic valve regurgitation is not visualized. No aortic stenosis is present. Aortic valve mean gradient measures 4.3 mmHg. Aortic valve peak gradient measures 6.8 mmHg. Aortic valve area, by VTI measures 1.92 cm. Pulmonic Valve: The pulmonic valve was normal in structure. Pulmonic valve regurgitation is not visualized. No evidence of pulmonic stenosis. Aorta: The aortic root is normal in size and structure. Venous: The inferior vena cava is dilated in size with less than 50% respiratory variability, suggesting right atrial pressure of 15 mmHg. IAS/Shunts: No atrial level shunt detected by color flow Doppler.  LEFT VENTRICLE PLAX 2D LVIDd:         2.70 cm   Diastology LVIDs:         1.60 cm   LV e' medial:    5.87 cm/s LV PW:         1.20 cm   LV E/e' medial:  25.9 LV IVS:        1.40 cm   LV e' lateral:   4.68 cm/s LVOT diam:     2.00 cm   LV E/e' lateral: 32.5 LV SV:         29 LV SV Index:   13 LVOT Area:     3.14 cm  RIGHT VENTRICLE RV Basal diam:  5.80 cm RV Mid diam:    5.20 cm LEFT ATRIUM         Index       RIGHT ATRIUM           Index LA diam:    4.60 cm 2.02 cm/m  RA Area:     34.10 cm                                 RA Volume:   139.00 ml 60.99 ml/m  AORTIC VALVE AV Area (Vmax):    1.75 cm AV Area (Vmean):   1.62 cm AV Area (VTI):     1.92 cm AV Vmax:           130.67 cm/s AV Vmean:          97.733 cm/s AV VTI:            0.151 m AV Peak Grad:      6.8 mmHg AV Mean Grad:      4.3 mmHg LVOT Vmax:         72.70 cm/s LVOT Vmean:        50.300 cm/s LVOT VTI:          0.092 m LVOT/AV VTI ratio: 0.61  AORTA Ao Root diam: 2.70 cm MITRAL VALVE                 TRICUSPID  VALVE MV Area (PHT): 3.76 cm     TR Peak grad:   55.4 mmHg MV Area VTI:   0.57 cm     TR Vmax:        372.00 cm/s MV Peak grad:  21.9 mmHg MV Mean grad:  10.0 mmHg    SHUNTS MV Vmax:       2.34 m/s     Systemic VTI:  0.09 m MV Vmean:      146.0 cm/s   Systemic Diam: 2.00 cm MV Decel Time: 202 msec MV E velocity: 152.00 cm/s MV A velocity: 200.00 cm/s MV E/A ratio:  0.76 Lorine Bears MD Electronically signed by Lorine Bears MD Signature Date/Time: 08/26/2022/2:57:48 PM    Final    DG Chest Port 1 View  Result Date: 08/26/2022 CLINICAL DATA:  Central line placement EXAM: PORTABLE CHEST 1 VIEW COMPARISON:  Previous studies including the examination done earlier today FINDINGS: Transverse diameter of heart is increased. Central pulmonary vessels are prominent. Apparent shift of mediastinum to the right may be due to rotation. Tip of endotracheal tube is 4 cm above the carina. Enteric tube is noted traversing the esophagus. There is interval removal of right IJ central venous catheter. Linear densities are seen in lower lung fields, more so on the right side. Lateral CP angles are clear. There is no pneumothorax. IMPRESSION: Cardiomegaly. Linear densities are seen in the lower lung fields suggesting subsegmental atelectasis. Central pulmonary vessels are prominent without signs of pulmonary edema. There is interval removal of right IJ vascular catheter. Electronically Signed   By: Ernie Avena M.D.   On: 08/26/2022 13:10   CARDIAC CATHETERIZATION  Result Date: 08/26/2022 See surgical note for result.  DG Chest 1 View  Result Date: 08/26/2022 CLINICAL DATA:  Check endotracheal tube placement EXAM: PORTABLE CHEST 1 VIEW COMPARISON:  08/25/2022 FINDINGS: Cardiac shadow is enlarged. Endotracheal tube is noted 3.4 cm above the carina. Right jugular central line is noted coursing across the midline similar to that seen on the prior exam. The tip likely lies within the left innominate vein. Gastric  catheter extends towards the stomach. Proximal side port lies at the gastroesophageal junction. This should be advanced deeper into the stomach. Mild central vascular congestion is noted. IMPRESSION: Changes consistent with mild CHF. Tubes and lines as described above. The gastric catheter should be advanced deeper into the stomach. Electronically Signed   By: Alcide Clever M.D.   On: 08/26/2022 03:21   DG Chest Port 1 View  Result Date: 08/25/2022 CLINICAL DATA:  Readjustment of central venous catheter EXAM: PORTABLE CHEST 1 VIEW COMPARISON:  Chest x-ray August 25, 2022 at 1531 hours FINDINGS: Interval retraction of the right central venous catheter, with tip projecting near the left clavicle. Unchanged cardiomegaly and mediastinal contours. Bilateral reticular pulmonary opacities. Moderate right pleural effusion. The visualized skeletal structures are unremarkable. IMPRESSION: 1. Interval retraction of the right central venous catheter, likely within the left subclavian vein. Upon discussion with the ordering provider, venous return was noted during line placement. 2. Pulmonary edema and moderate right pleural effusion. These findings were discussed with Eli Hose by Jacob Moores at 4:06 pm on 08/25/2022. Electronically Signed   By: Jacob Moores M.D.   On: 08/25/2022 16:06   DG Chest Port 1 View  Result Date: 08/25/2022 CLINICAL DATA:  Central venous catheter placement EXAM: PORTABLE CHEST 1 VIEW COMPARISON:  CT chest PE protocol December 15, 2014 FINDINGS: A right approach central venous catheter is seen crossing  the mediastinum and terminates near the left hilum. On review of prior CT PE protocol, a left-sided SVC is not present. Unchanged cardiomegaly and mediastinal contours. Low lung volumes with bronchovascular crowding. Bilateral vascular prominence and reticular pulmonary opacities. Moderate right pleural effusion. The visualized skeletal structures are unremarkable. IMPRESSION: 1. A right  approach central venous catheter crosses the mediastinum and terminates near the left hilum. Differential considerations include malpositioning within an accessory vein arising from the left brachiocephalic vein versus arterial placement. Upon discussion with ordering provider, venous return was noted during line placement. However, consider correlation with blood gas. 2. Pulmonary edema and moderate right pleural effusion. These findings were discussed with Eli Hose by Jacob Moores at 4:03 pm on 08/25/2022. Electronically Signed   By: Jacob Moores M.D.   On: 08/25/2022 16:04     Medications:    anticoagulant sodium citrate     ceFEPime (MAXIPIME) IV Stopped (08/27/22 0932)   epinephrine Stopped (08/26/22 1732)   feeding supplement (VITAL AF 1.2 CAL) 60 mL/hr at 08/27/22 1000   norepinephrine (LEVOPHED) Adult infusion 15 mcg/min (08/27/22 1000)   prismasol BGK 2/2.5 dialysis solution 1,500 mL/hr at 08/27/22 0413   prismasol BGK 2/2.5 replacement solution 300 mL/hr at 08/27/22 1610   prismasol BGK 2/2.5 replacement solution 300 mL/hr at 08/27/22 0709   propofol (DIPRIVAN) infusion Stopped (08/27/22 0711)   vancomycin     vasopressin 0.03 Units/min (08/27/22 1000)    arformoterol  15 mcg Nebulization Q12H   aspirin EC  81 mg Oral Daily   budesonide  0.5 mg Nebulization BID   Chlorhexidine Gluconate Cloth  6 each Topical Daily   [START ON 08/28/2022] cholecalciferol  1,000 Units Per Tube Daily   docusate  100 mg Per Tube BID   escitalopram  10 mg Per Tube Daily   famotidine  20 mg Per Tube QODAY   fluconazole  400 mg Per Tube Daily   free water  30 mL Per Tube Q4H   heparin injection (subcutaneous)  5,000 Units Subcutaneous Q12H   insulin aspart  0-9 Units Subcutaneous Q4H   montelukast  10 mg Per Tube QHS   multivitamin  1 tablet Per Tube QHS   nutrition supplement (JUVEN)  1 packet Per Tube BID BM   mouth rinse  15 mL Mouth Rinse Q2H   polyethylene glycol  17 g Per Tube Daily    sevelamer carbonate  2.4 g Per Tube TID WC   acetaminophen, albuterol, anticoagulant sodium citrate, bisacodyl, fentaNYL (SUBLIMAZE) injection, fentaNYL (SUBLIMAZE) injection, heparin, heparin, lidocaine (PF), lidocaine-prilocaine, naLOXone (NARCAN)  injection, ondansetron **OR** ondansetron (ZOFRAN) IV, mouth rinse, pentafluoroprop-tetrafluoroeth, sodium chloride  Assessment/ Plan:  [name] is a 52 y.o.  female with a past medical history of end-stage renal disease, patient is on Tuesday Thursday Saturday schedule, diabetes mellitus, history of pancreatitis who came to the ER with chief complaint of abdominal pain with foul smelling urine for 2 weeks.  [facility] TTS Left AVF 127.5kg   End stage renal disease on hemodialysis. CRRT initiated on 08/26/22. Will increase UF to 164ml/hr. May require increased pressors. Will continue to monitor.   2. Anemia of chronic kidney disease Lab Results  Component Value Date   HGB 13.8 08/27/2022    Hgb remains acceptable for this patient.  3. Secondary Hyperparathyroidism: with outpatient labs: PTH 2361, phosphorus 9.0, calcium 8.4 on 08/06/22.   Lab Results  Component Value Date   PTH 134 (H) 04/21/2017   CALCIUM 8.5 (  L) 08/27/2022   CAION 0.81 (LL) 10/26/2020   PHOS 5.0 (H) 08/27/2022   Bone minerals acceptable at this time. Continue cholecalciferol and sevelamer with meals  4. Diabetes mellitus type II with chronic kidney disease/renal manifestations: noninsulin dependent. Most recent hemoglobin A1c is 7.8 on 08/18/22.  Primary team to manage sliding scale insulin  5. Hyperkalemia, Corrected   LOS: 9 [name] 7/2/202410:31 AM

## 2022-08-27 NOTE — Inpatient Diabetes Management (Signed)
Inpatient Diabetes Program Recommendations  AACE/ADA: New Consensus Statement on Inpatient Glycemic Control (2015)  Target Ranges:  Prepandial:   less than 140 mg/dL      Peak postprandial:   less than 180 mg/dL (1-2 hours)      Critically ill patients:  140 - 180 mg/dL    Latest Reference Range & Units 08/25/22 23:51 08/26/22 02:36 08/26/22 07:26 08/26/22 11:37 08/26/22 16:27 08/26/22 20:43  Glucose-Capillary 70 - 99 mg/dL 92 63 (L) 161 (H)  1 unit Novolog  180 (H)  2 units Novolog  197 (H)  2 units Novolog  175 (H)  2 units Novolog   (L): Data is abnormally low (H): Data is abnormally high  Latest Reference Range & Units 08/26/22 23:35 08/27/22 04:35 08/27/22 07:58  Glucose-Capillary 70 - 99 mg/dL 096 (H)  2 units Novolog  221 (H)  3 units Novolog  221 (H)  3 units Novolog   (H): Data is abnormally high    Home DM Meds: Tresiba 20 units Daily  Current Orders: Novolog Sensitive Correction Scale/ SSI (0-9 units) Q4 hours    MD- Note Tube Feedings Started last PM (60cc/hr)  CBGs have risen into the low 200s since 4am  May consider adding low dose Novolog Tube Feed Coverage: Novolog 3 units Q4 hours HOLD if Tube Feeds HELD for any reason    --Will follow patient during hospitalization--  Ambrose Finland RN, MSN, CDCES Diabetes Coordinator Inpatient Glycemic Control Team Team Pager: [phone] (8a-5p)

## 2022-08-27 NOTE — Progress Notes (Signed)
CRRT stopped for CT scan. 

## 2022-08-27 NOTE — Progress Notes (Signed)
PT TO/FROM CT WHILE ON TRANSPORT VENT WITH NO COMPLICATIONS.

## 2022-08-27 NOTE — Progress Notes (Signed)
CRRT filter high pressure. Will replace filter and intitiate CRRT.

## 2022-08-27 NOTE — Progress Notes (Signed)
Dr. Aundria Rud notified of patients pupils not constricting this am. Propofol turned off. Lactic acid 2.5. Orders for a CT scan placed. Nephrology notified, patients left upper arm fistula has no bruit and thrill. At this time no additional orders, continue to assess.

## 2022-08-27 NOTE — Progress Notes (Signed)
   08/27/22 1600  Spiritual Encounters  Type of Visit Initial  Care provided to: Patient  Referral source Chaplain assessment  Reason for visit Urgent spiritual support  OnCall Visit No   Chaplain went to visit pt to pray for her and give a blessing over her. Nurse shared with chaplain that the pt's family just left and that they have been here everyday waiting for the pt to wake up. Chaplain will go back and see pt and follow up with her family.

## 2022-08-27 NOTE — Consult Note (Signed)
Pharmacy Antibiotic Note  [name] is a 52 y.o. female admitted on 08/18/2022 with  interm history of dysuria as well as foul odor associated with her urine .  Pharmacy has been consulted for cefepime dosing.  Patient was in usual state until the early morning of 7/1 when she was found unresponsive. Per RN and RT, patient refused her BiPAP yesterday at 8 pm due to  improperly fitting mask and claustrophobia. She was found to be unresponsive but with a weak pulse. Code blue was subsequently called. At the time of arrival on scene, patient was unresponsive and bradycardia in the 30'2-40 on the monitor.  Atropine was administered with brief responsiveness then she subsequently lost a pulse and CPR was initiated and ACLS drugs administered as below. Patient was immediately intubated for airway protection.   [name] kidney function continues to worsen, now transitioned from hemodialysis to CRRT.  Plan:   start cefepime 2 grams IV every 12 hours   Height: 5' 4.5" (163.8 cm) Weight: 135 kg (297 lb 9.9 oz) IBW/kg (Calculated) : 55.85  Temp (24hrs), Avg:99.8 F (37.7 C), Min:99.5 F (37.5 C), Max:101.3 F (38.5 C)  Recent Labs  Lab 08/24/22 0605 08/24/22 1849 08/25/22 0517 08/25/22 1259 08/25/22 1637 08/26/22 0351 08/26/22 0708 08/26/22 1225 08/26/22 1316 08/26/22 1721 08/27/22 0417  WBC 19.4*  --  25.4*  --   --  23.0*  --   --  24.7*  --  20.4*  CREATININE 5.73*   < > 5.80*  --   --  6.50*  --   --  6.26* 6.23* 4.40*  LATICACIDVEN  --   --   --  4.1* 3.8* 6.9* 6.2* 5.2*  --   --   --    < > = values in this interval not displayed.     Estimated Creatinine Clearance: 20.9 mL/min (A) (by C-G formula based on SCr of 4.4 mg/dL (H)).    Allergies  Allergen Reactions   Dust Mite Extract Shortness Of Breath and Itching   Mold Extract [Trichophyton] Shortness Of Breath and Itching   Pollen Extract Shortness Of Breath    Itching, shortness of breath, asthma like symptoms    Ozempic (0.25 Or 0.5 Mg-Dose) [Semaglutide(0.25 Or 0.5mg -Dos)] Other (See Comments)    pancreatitis   Sulfa Antibiotics Itching   Feraheme [Ferumoxytol] Itching    Uses benadryl so she can get the medicine    Antimicrobials this admission: vancomycin 7/1 >> 7/2 ceftriaxone 6/23 >> 7/2 metronidazole 6/25 >> 7/2 cefepime 7/2 >> doxycycline 6/28 >> fluconazole 6/28 >>  Microbiology results: 6/30 BCx: NGTD  6/23 UCx: 100k yeast  6/30 MRSA PCR: negative 7/1 resp Cx: GPC pairs  Thank you for allowing pharmacy to be a part of this patient's care.  Lowella Bandy 08/27/2022 6:53 AM

## 2022-08-27 NOTE — Progress Notes (Signed)
NAME:  MALAISHA KNOPIK, MRN:  [id number], DOB:  1970-06-24, LOS: 9 ADMISSION DATE:  08/18/2022, CHIEF COMPLAINT:  Cardiac arrest, respiratory failure   History of Present Illness:  Patient is a 52 year old female with a past medical history of ESRD (on HD), COPD/Asthma, OSA/OHS, chronic hypoxic respiratory failure, HTN, HFpEF, T2DM, and morbid obesity who presented to the hospital with dysuria, now in the ICU with altered mental status complicated by cardiac arrest.   Patient initially presented to the hospital on 08/18/2022 with dysuria and admitted for the management of UTI. Cultures have grown yeast, and she's received IV antibiotics as well as anti-fungal therapy. During the course of her hospitalization she was evaluated by gynecology and underwent a cervical biopsy. [name] mental status was waxing and waning in the setting of ESRD, acidemia, and hypercapnia. She did not tolerate BiPAP nocturnally. Given worsening, patient was moved to the ICU for monitoring as well as for consideration of CRRT. She had a central line placed by vascular surgery at the bedside yesterday.    Overnight, patient was noted to be unresponsive with the development of bradycardia followed by PEA arrest. She was resuscitated after 15 minutes of CPR and this morning was noted to be on pressors.  Significant Hospital Events: Including procedures, antibiotic start and stop dates in addition to other pertinent events   08/25/2022: admitted to the ICU 08/26/2022: right CVC noted in arterial position, vascular surgery discontinued. Dialysis catheter placed. CRRT initiated.  Interim History / Subjective:  Patient remains sedated this AM.  Objective   Blood pressure (!) 88/67, pulse (!) 101, temperature 99.5 F (37.5 C), resp. rate 19, height 5' 4.5" (1.638 m), weight 135 kg, last menstrual period 10/14/2012, SpO2 96 %.    Vent Mode: PRVC FiO2 (%):  [40 %-90 %] 40 % Set Rate:  [18 bmp] 18 bmp Vt Set:  [530 mL] 530  mL PEEP:  [8 cmH20] 8 cmH20 Plateau Pressure:  [25 cmH20-33 cmH20] 26 cmH20   Intake/Output Summary (Last 24 hours) at 08/27/2022 0739 Last data filed at 08/27/2022 4098 Gross per 24 hour  Intake 2903.42 ml  Output 2398 ml  Net 505.42 ml   Filed Weights   08/25/22 1356 08/26/22 0700 08/27/22 0500  Weight: 128.3 kg 127.9 kg 135 kg    Examination: Physical Exam Constitutional:      General: She is not in acute distress.    Appearance: She is obese. She is ill-appearing.  HENT:     Head: Normocephalic.  Cardiovascular:     Rate and Rhythm: Normal rate and regular rhythm.     Pulses: Normal pulses.     Heart sounds: Normal heart sounds.  Pulmonary:     Comments: Ventilated breath sounds bilaterally Abdominal:     General: There is distension.     Palpations: Abdomen is soft.  Neurological:     Mental Status: She is disoriented.     Comments: Patient sedated following cardiac arrest. Pupils equal and sluggish.       Assessment & Plan:   Neurology #Toxic Metabolic Encephalopathy   This is in the setting of cardiac arrest, metabolic acidosis, toxic metabolite accumulation secondary to ESRD, as well as CO2 narcosis. Patient was intubated in the setting of her cardiac arrest, and was initiated on propofol for sedation with PRN's in place for analgesia. CRRT initiated for clearance of toxic metabolites, patient ventilated with improvement in her hypercapnia. Will maintain normothermia following cardiac arrest. Daily sedation holiday and wake  up assessment.    -Maintain a RASS goal of -1 -Daily wake up assessment -Maintain normothermia -monitor for CVA given CVC in the carotid -PRN ABG's   Cardiovascular #Cardiac Arrest #Circulatory Shock #HFpEF #Group 2 Pulmonary Hypertension #Inadvertent CVC placement in the carotid   Cardiac arrest was likely secondary to CO2 narcosis and metabolic acidosis, preceded by bradycardia. Blood gas had shown acidosis peri-arrest. Rise in  troponin likely secondary to chest compressions and ACLS, with post arrest EKG not suggestive of ischemia nor arrhythmia. Repeat echocardiogram showed dilated right ventricle, elevated PASP, and moderate to severe mitral stenosis similar to prior.   Developed circulatory shock post cardiac arrest, and is currently on nor-epinephrine and vasopressin. Will continue to the cover the patient broadly for possible septic shock as a cause of distributive shock and consider volume removal with CRRT.   Furthermore, patient had a central line placed in the carotid artery that was removed yesterday with vascular surgery.  -continue vasopressor support with goal MAP 65 -consider cardiology consult -trend troponin and lactic acid   Pulmonary #Acute on Chronic Hypoxic Respiratory Failure #OSA/OHS #Asthma/COPD overlap   Intubated in the setting of hypoxic arrest; known history of severe OHS/OSA and asthma/COPD. Currently ventilated with blood gases showing metabolic acidosis. Will follow blood gas and adjust ventilator settings accordingly. Does have increased secretions from the ETT which were cultured yesterday.   -Full vent support, implement lung protective strategies -Plateau pressures less than 30 cm H20 -Wean FiO2 & PEEP as tolerated to maintain O2 sats >92% -Follow intermittent Chest X-ray & ABG as needed -Spontaneous Breathing Trials when respiratory parameters met and mental status permits -Implement VAP Bundle -Continue Bronchodilators   Gastrointestinal #Elevated liver enzymes   LFT's are chronically elevated. RUQ ultrasound not showing acute findings. Likely NAFLD. Patient also on PPI for SUP and tube feeds were initiated.  Renal #ESRD on HD #Anion Gap Metabolic Acidosis   History of ESRD on HD. Given hemodynamic instability, pressor use, and cardiac arrest, will did proceed with CRRT for management of acidosis and volume status. Patient will benefit from volume removal given elevated  PASP and signs of RV pressure overload. Appreciate input from nephrology.   Endocrine #T2DM  ICU glycemic protocol.  Hem/Onc Will initiate heparin for DVT prophylaxis   ID #UTI #Aspiration Pneumonia  Patient treated for UTI with Ceftriaxone, which we'll broaden to Cefepime pending respiratory cultures. She was also started on metronidazole and doxycycline for concern for PID which we'll discontinue today given low suspicion. We did add vancomycin   -d/c vancomycin -d/c metronidazole -d/c doxycycline -switch CTX to Cefepime -will consider discontinuation of fluconazole  Best Practice (right click and "Reselect all SmartList Selections" daily)   Diet/type: tubefeeds DVT prophylaxis: prophylactic heparin  GI prophylaxis: PPI Lines: Central line and Dialysis Catheter Foley:  Yes, and it is still needed Code Status:  full code Last date of multidisciplinary goals of care discussion [08/27/2022]  Labs   CBC: Recent Labs  Lab 08/24/22 0605 08/25/22 0517 08/26/22 0351 08/26/22 1316 08/27/22 0417  WBC 19.4* 25.4* 23.0* 24.7* 20.4*  NEUTROABS  --   --   --  21.6* 16.5*  HGB 13.5 13.5 13.9 14.1 13.8  HCT 41.8 41.4 42.3 42.1 41.0  MCV 91.1 91.6 90.6 88.3 88.0  PLT 298 272 267 284 259    Basic Metabolic Panel: Recent Labs  Lab 08/25/22 0517 08/26/22 0351 08/26/22 1316 08/26/22 1721 08/27/22 0417  NA 127* 130* 130* 131* 129*  K 5.0 5.0  5.0 5.2* 4.2  CL 91* 89* 90* 88* 91*  CO2 20* 16* 17* 22 18*  GLUCOSE 95 162* 221* 203* 238*  BUN 51* 64* 66* 64* 49*  CREATININE 5.80* 6.50* 6.26* 6.23* 4.40*  CALCIUM 7.8* 8.4* 7.7* 8.6* 8.5*  MG  --  2.7*  --   --  2.2  PHOS 7.1*  --   --  7.6* 5.0*   GFR: Estimated Creatinine Clearance: 20.9 mL/min (A) (by C-G formula based on SCr of 4.4 mg/dL (H)). Recent Labs  Lab 08/25/22 0517 08/25/22 1259 08/25/22 1637 08/26/22 0351 08/26/22 0708 08/26/22 1225 08/26/22 1316 08/27/22 0417  PROCALCITON  --   --   --   --   --   --   3.71  --   WBC 25.4*  --   --  23.0*  --   --  24.7* 20.4*  LATICACIDVEN  --    < > 3.8* 6.9* 6.2* 5.2*  --   --    < > = values in this interval not displayed.    Liver Function Tests: Recent Labs  Lab 08/23/22 0640 08/24/22 0605 08/24/22 1849 08/25/22 0517 08/26/22 0351 08/26/22 1316 08/26/22 1721 08/27/22 0417  AST 279* 190* 173*  --  263* 329*  --   --   ALT 97* 64* 57*  --  65* 73*  --   --   ALKPHOS 648* 626* 597*  --  570* 547*  --   --   BILITOT 2.8* 2.0* 2.0*  --  2.5* 2.5*  --   --   PROT 6.9 6.9 7.1  --  6.5 6.5  --   --   ALBUMIN 3.0* 3.1* 3.4* 3.1* 3.0* 2.9* 3.2* 2.9*   No results for input(s): "LIPASE", "AMYLASE" in the last 168 hours. No results for input(s): "AMMONIA" in the last 168 hours.  ABG    Component Value Date/Time   PHART 7.32 (L) 08/27/2022 0500   PCO2ART 41 08/27/2022 0500   PO2ART 109 (H) 08/27/2022 0500   HCO3 21.1 08/27/2022 0500   TCO2 23 10/26/2020 1619   ACIDBASEDEF 4.8 (H) 08/27/2022 0500   O2SAT 99 08/27/2022 0500     Coagulation Profile: No results for input(s): "INR", "PROTIME" in the last 168 hours.  Cardiac Enzymes: No results for input(s): "CKTOTAL", "CKMB", "CKMBINDEX", "TROPONINI" in the last 168 hours.  HbA1C: Hgb A1c MFr Bld  Date/Time Value Ref Range Status  08/18/2022 12:11 PM 7.8 (H) 4.8 - 5.6 % Final    Comment:    (NOTE) Pre diabetes:          5.7%-6.4%  Diabetes:              >6.4%  Glycemic control for   <7.0% adults with diabetes   04/16/2022 02:15 AM 7.3 (H) 4.8 - 5.6 % Final    Comment:    (NOTE) Pre diabetes:          5.7%-6.4%  Diabetes:              >6.4%  Glycemic control for   <7.0% adults with diabetes     CBG: Recent Labs  Lab 08/26/22 1137 08/26/22 1627 08/26/22 2043 08/26/22 2335 08/27/22 0435  GLUCAP 180* 197* 175* 176* 221*    Review of Systems:   Unable to obtain  Past Medical History:  She,  has a past medical history of (HFpEF) heart failure with preserved ejection  fraction (HCC), Anxiety, Asthma, Bronchitis, CHF (congestive heart failure) (HCC),  Chronic cough, Chronic respiratory failure with hypoxia and hypercapnia (HCC), CKD (chronic kidney disease), stage III (HCC), Depression, Diabetes mellitus without complication (HCC), Diverticulosis, Dyspnea, Dysrhythmia, Environmental and seasonal allergies, Extreme obesity with alveolar hypoventilation (HCC), Hypertension, Iron deficiency anemia, Kidney failure, Orthopnea, Oxygen dependent, Pericardial effusion, Pneumonia, Poorly controlled diabetes mellitus (HCC), and Swelling of both lower extremities.   Surgical History:   Past Surgical History:  Procedure Laterality Date   A/V FISTULAGRAM Left 06/09/2017   Procedure: A/V FISTULAGRAM;  Surgeon: Annice Needy, MD;  Location: [facility];  Service: Cardiovascular;  Laterality: Left;   A/V FISTULAGRAM Left 06/16/2017   Procedure: A/V FISTULAGRAM;  Surgeon: Annice Needy, MD;  Location: [facility];  Service: Cardiovascular;  Laterality: Left;   A/V FISTULAGRAM Left 01/28/2018   Procedure: A/V FISTULAGRAM;  Surgeon: Annice Needy, MD;  Location: [facility];  Service: Cardiovascular;  Laterality: Left;   AMPUTATION TOE Right 10/21/2020   Procedure: AMPUTATION TOE-5th Digit Amputation Right Foot;  Surgeon: Felecia Shelling, DPM;  Location: [facility];  Service: Podiatry;  Laterality: Right;   AV FISTULA PLACEMENT Left 04/25/2017   Procedure: ARTERIOVENOUS (AV) FISTULA CREATION;  Surgeon: Annice Needy, MD;  Location: [facility];  Service: Vascular;  Laterality: Left;   BONE BIOPSY Right 04/27/2021   Procedure: Right 4th metatarsal BONE BIOPSY;  Surgeon: Candelaria Stagers, DPM;  Location: [facility];  Service: Podiatry;  Laterality: Right;   CATARACT EXTRACTION W/PHACO Left 01/21/2018   Procedure: CATARACT EXTRACTION PHACO AND INTRAOCULAR LENS PLACEMENT (IOC);  Surgeon: Lockie Mola, MD;  Location: [facility];  Service: Ophthalmology;  Laterality: Left;  [place]  01:55.5 CDE 9.05 EAUP 13.9 Fluid Pack Lot # [id number]   CENTRAL LINE INSERTION N/A 08/26/2022   Procedure: CENTRAL LINE INSERTION;  Surgeon: Renford Dills, MD;  Location: [facility];  Service: Cardiovascular;  Laterality: N/A;   CERVICAL CONIZATION W/BX N/A 08/21/2022   Procedure: CERVICAL BIOPSY;  Surgeon: Feliberto Gottron, Ihor Austin, MD;  Location: [facility];  Service: Gynecology;  Laterality: N/A;   CESAREAN SECTION     times 3   DIALYSIS/PERMA CATHETER INSERTION Right 04/04/2017   Procedure: DIALYSIS/PERMA CATHETER INSERTION;  Surgeon: Renford Dills, MD;  Location: [facility];  Service: Cardiovascular;  Laterality: Right;   DIALYSIS/PERMA CATHETER REMOVAL N/A 09/17/2017   Procedure: DIALYSIS/PERMA CATHETER REMOVAL;  Surgeon: Annice Needy, MD;  Location: [facility];  Service: Cardiovascular;  Laterality: N/A;   INCISION AND DRAINAGE Right 10/26/2020   Procedure: INCISION AND DRAINAGE;  Surgeon: Candelaria Stagers, DPM;  Location: [facility];  Service: Podiatry;  Laterality: Right;   INSERTION OF DIALYSIS CATHETER N/A 04/16/2017   Procedure: INSERTION OF DIALYSIS PERM CATHETER;  Surgeon: Annice Needy, MD;  Location: [facility];  Service: Vascular;  Laterality: N/A;   LOWER EXTREMITY ANGIOGRAPHY Right 10/18/2020   Procedure: Lower Extremity Angiography;  Surgeon: Annice Needy, MD;  Location: [facility];  Service: Cardiovascular;  Laterality: Right;   LOWER EXTREMITY ANGIOGRAPHY Right 04/17/2022   Procedure: Lower Extremity Angiography;  Surgeon: Annice Needy, MD;  Location: [facility];  Service: Cardiovascular;  Laterality: Right;     Social History:   reports that she has never smoked. She has been exposed to tobacco smoke. She has never used smokeless tobacco. She reports that she does not currently use alcohol. She reports current drug use. Drug: Marijuana.   Family History:  Her family  history includes Breast cancer (age of onset: 17) in her maternal  aunt and maternal aunt; Diabetes in her father, mother, and another family member; Hypertension in her father, mother, and another family member.   Allergies Allergies  Allergen Reactions   Dust Mite Extract Shortness Of Breath and Itching   Mold Extract [Trichophyton] Shortness Of Breath and Itching   Pollen Extract Shortness Of Breath    Itching, shortness of breath, asthma like symptoms   Ozempic (0.25 Or 0.5 Mg-Dose) [Semaglutide(0.25 Or 0.5mg -Dos)] Other (See Comments)    pancreatitis   Sulfa Antibiotics Itching   Feraheme [Ferumoxytol] Itching    Uses benadryl so she can get the medicine     Home Medications  Prior to Admission medications   Medication Sig Start Date End Date Taking? Authorizing Provider  acetaminophen (TYLENOL) 500 MG tablet Take 1,000 mg by mouth every 6 (six) hours as needed for mild pain, fever or headache.   Yes [provider]  albuterol (PROVENTIL) (2.5 MG/3ML) 0.083% nebulizer solution Take 3 mLs (2.5 mg total) by nebulization every 6 (six) hours as needed for wheezing or shortness of breath. 02/15/22  Yes Salena Saner, MD  albuterol (VENTOLIN HFA) 108 (90 Base) MCG/ACT inhaler Inhale 2 puffs into the lungs every 6 (six) hours as needed for wheezing or shortness of breath. 02/15/22  Yes Salena Saner, MD  atorvastatin (LIPITOR) 40 MG tablet Take 1 tablet (40 mg total) by mouth daily. 04/20/22  Yes Regalado, Belkys A, MD  benzonatate (TESSALON PERLES) 100 MG capsule Take 1 capsule (100 mg total) by mouth 3 (three) times daily as needed for cough. 05/22/22 05/22/23 Yes Boswell, Chelsa, NP  budesonide (PULMICORT) 0.5 MG/2ML nebulizer solution USE 1 VIAL  IN  NEBULIZER TWICE  DAILY - (Rinse Mouth After Each Treatment) 08/13/22  Yes Orson Eva, NP  ciprofloxacin (CIPRO) 500 MG tablet Take 500 mg by mouth daily. 08/13/22  Yes [provider]  clindamycin (CLEOCIN T) 1 % lotion APPLY TO FACE, ARMS AND LEGS EVENLY NIGHTLY AT BEDTIME 06/27/22   Yes Boswell, Chelsa, NP  escitalopram (LEXAPRO) 10 MG tablet Take 1 tablet (10 mg total) by mouth daily. 04/24/22 04/24/23 Yes Boswell, Gareth Morgan, NP  formoterol (PERFOROMIST) 20 MCG/2ML nebulizer solution USE 1 VIAL  IN  NEBULIZER TWICE  DAILY - (Morning And Evening) 08/13/22  Yes Boswell, Chelsa, NP  gentamicin cream (GARAMYCIN) 0.1 % Apply 1 Application topically at bedtime. 06/27/22  Yes [provider]  insulin degludec (TRESIBA) 100 UNIT/ML FlexTouch Pen Inject 20 Units into the skin daily. 04/20/22  Yes Regalado, Belkys A, MD  lidocaine-prilocaine (EMLA) cream Apply topically as needed (For access for dialysis).   Yes [provider]  LINZESS 290 MCG CAPS capsule TAKE 1 CAPSULE BY MOUTH IN THE MORNING WITH A GLASS OF WATER 30 MINUTES BEFORE BREAKFAST 08/08/22  Yes Miki Kins, FNP  methocarbamol (ROBAXIN) 750 MG tablet Take 750 mg by mouth every 8 (eight) hours as needed for muscle spasms.   Yes [provider]  midodrine (PROAMATINE) 10 MG tablet Take 20 mg by mouth daily as needed (for low blood pressure on dialysis days). 08/23/21  Yes [provider]  ondansetron (ZOFRAN) 4 MG tablet Take 4 mg by mouth 2 (two) times daily as needed for nausea. 07/02/22  Yes [provider]  pregabalin (LYRICA) 50 MG capsule TAKE 1 CAPSULE BY MOUTH EVERY 8 HOURS AS NEEDED FOR  NEUROPATHY 07/01/22  Yes Orson Eva, NP  revefenacin (YUPELRI)  175 MCG/3ML nebulizer solution  01/19/21  Yes [provider]  sevelamer (RENAGEL) 800 MG tablet Take 800-2,400 mg by mouth 4 (four) times daily. Three tablets three times daily with meals and 1 tablet with snacks once daily   Yes [provider]  torsemide (DEMADEX) 100 MG tablet Take 100 mg by mouth daily. 05/21/22  Yes [provider]  aspirin EC 81 MG tablet Take 81 mg by mouth daily.  Patient not taking: Reported on 08/19/2022    [provider]  cloNIDine (CATAPRES) 0.1 MG tablet Take 0.1 mg  by mouth 2 (two) times daily. Patient not taking: Reported on 08/19/2022 03/21/22   [provider]  doxepin (SINEQUAN) 10 MG capsule Take 1 capsule (10 mg total) by mouth at bedtime. Patient not taking: Reported on 08/19/2022 04/24/22   Orson Eva, NP  ferric citrate (AURYXIA) 1 GM 210 MG(Fe) tablet Take 210 mg by mouth 3 (three) times daily with meals. Only taking once a day most days Patient not taking: Reported on 08/19/2022    [provider]  insulin lispro (HUMALOG) 100 UNIT/ML KwikPen Inject into the skin. Patient not taking: Reported on 08/19/2022 06/12/21   [provider]  lactulose (CHRONULAC) 10 GM/15ML solution Take 30 mLs (20 g total) by mouth daily as needed for severe constipation. Patient not taking: Reported on 08/19/2022 04/26/17   Katha Hamming, MD  montelukast (SINGULAIR) 10 MG tablet Take 1 tablet (10 mg total) by mouth at bedtime. Patient not taking: Reported on 08/19/2022 09/17/21   Salena Saner, MD  Omeprazole Magnesium (PRILOSEC PO)     [provider]  OXYGEN Inhale 2-4 L into the lungs.    [provider]  predniSONE (DELTASONE) 50 MG tablet Take 1 tablet by mouth daily in AM with food x 5 days Patient not taking: Reported on 08/19/2022 04/24/22   Orson Eva, NP  RENVELA 2.4 g PACK Take 2.4 g by mouth 3 (three) times daily. Patient not taking: Reported on 08/19/2022 08/07/21   [provider]  sucroferric oxyhydroxide (VELPHORO) 500 MG chewable tablet Chew 500 mg by mouth 3 (three) times daily with meals.    [provider]  Vitamin D, Cholecalciferol, 25 MCG (1000 UT) TABS Take 1,000 Units by mouth daily. Patient not taking: Reported on 08/19/2022    [provider]     Critical care time: 50 minutes    Raechel Chute, MD [facility]

## 2022-08-27 NOTE — Progress Notes (Signed)
[facility]  Daily Progress Note   Subjective  - 1 Day Post-Op  Asked to evaluate the patient for 2 new concerns, her dialysis access as well as the ulcerations of her feet particularly Nedra Hai the right.  The patient is known to our service and has undergone angiography in February 2024.  She has also had her left brachiocephalic fistula evaluated in the past.  It has been noted that the fistula no longer has a thrill or bruit.  The lower extremity wounds appear stable.  At the time of my evaluation the patient is actually getting ready to be taken to MRI to see if we are better able to understand the degree of neurological impairment  Objective Vitals:   08/27/22 1930 08/27/22 1940 08/27/22 1945 08/27/22 1946  BP:      Pulse:   99   Resp: (!) 26 (!) 26 (!) 28 (!) 23  Temp: 99.3 F (37.4 C) 99.1 F (37.3 C) 99.3 F (37.4 C) 99.3 F (37.4 C)  TempSrc:   Esophageal   SpO2:   97%   Weight:      Height:        Intake/Output Summary (Last 24 hours) at 08/27/2022 2054 Last data filed at 08/27/2022 2000 Gross per 24 hour  Intake 2306.71 ml  Output 3558 ml  Net -1251.29 ml    PULM  intubated and sedated no use of accessory muscles CV  No JVD, RRR Abd      Non distended VASC  pedal pulses are nonpalpable.  Right heel is dressed no evidence of cellulitis or significant erythema edema.  There is a superficial wound of the left heel.  Left arm AV access no thrill no bruit. Neuro   patient's pupils appear fixed and she is exhibiting some decorticate posturing  Laboratory CBC    Component Value Date/Time   WBC 20.4 (H) 08/27/2022 0417   HGB 13.8 08/27/2022 0417   HGB 12.4 01/11/2014 1324   HCT 41.0 08/27/2022 0417   HCT 39.2 01/11/2014 1324   PLT 259 08/27/2022 0417   PLT 370 01/11/2014 1324    BMET    Component Value Date/Time   NA 129 (L) 08/27/2022 1523   NA 135 (L) 11/21/2013 1153   K 3.7 08/27/2022 1523   K 4.1 11/21/2013 1153   CL 96 (L)  08/27/2022 1523   CL 98 11/21/2013 1153   CO2 18 (L) 08/27/2022 1523   CO2 30 11/21/2013 1153   GLUCOSE 274 (H) 08/27/2022 1523   GLUCOSE 287 (H) 11/21/2013 1153   BUN 44 (H) 08/27/2022 1523   BUN 16 11/21/2013 1153   CREATININE 3.55 (H) 08/27/2022 1523   CREATININE 0.85 11/21/2013 1153   CALCIUM 8.0 (L) 08/27/2022 1523   CALCIUM 8.7 11/21/2013 1153   GFRNONAA 15 (L) 08/27/2022 1523   GFRNONAA >60 11/21/2013 1153   GFRAA 15 (L) 04/26/2017 0609   GFRAA >60 11/21/2013 1153    Assessment/Planning:  Currently given the circumstances I do not have any plans for revascularization or thrombectomy of her dialysis access.  Certainly, if the patient's overall condition were to improve these 2 problems would be readdressed.  At the present time we will continue to maintain catheter-based dialysis.  We will continue with the current wound care.   Levora Dredge  08/27/2022, 8:54 PM

## 2022-08-28 ENCOUNTER — Ambulatory Visit: Payer: 59

## 2022-08-28 ENCOUNTER — Inpatient Hospital Stay: Payer: 59

## 2022-08-28 DIAGNOSIS — J9621 Acute and chronic respiratory failure with hypoxia: Secondary | ICD-10-CM | POA: Diagnosis not present

## 2022-08-28 DIAGNOSIS — N186 End stage renal disease: Secondary | ICD-10-CM | POA: Diagnosis not present

## 2022-08-28 DIAGNOSIS — R4182 Altered mental status, unspecified: Secondary | ICD-10-CM

## 2022-08-28 DIAGNOSIS — G9341 Metabolic encephalopathy: Secondary | ICD-10-CM | POA: Diagnosis not present

## 2022-08-28 DIAGNOSIS — I050 Rheumatic mitral stenosis: Secondary | ICD-10-CM

## 2022-08-28 DIAGNOSIS — R569 Unspecified convulsions: Secondary | ICD-10-CM

## 2022-08-28 LAB — CBC WITH DIFFERENTIAL/PLATELET
Abs Immature Granulocytes: 0.95 10*3/uL — ABNORMAL HIGH (ref 0.00–0.07)
Basophils Absolute: 0.1 10*3/uL (ref 0.0–0.1)
Basophils Relative: 0 %
Eosinophils Absolute: 0 10*3/uL (ref 0.0–0.5)
Eosinophils Relative: 0 %
HCT: 41.6 % (ref 36.0–46.0)
Hemoglobin: 13.7 g/dL (ref 12.0–15.0)
Immature Granulocytes: 3 %
Lymphocytes Relative: 3 %
Lymphs Abs: 1 10*3/uL (ref 0.7–4.0)
MCH: 29.5 pg (ref 26.0–34.0)
MCHC: 32.9 g/dL (ref 30.0–36.0)
MCV: 89.5 fL (ref 80.0–100.0)
Monocytes Absolute: 2.2 10*3/uL — ABNORMAL HIGH (ref 0.1–1.0)
Monocytes Relative: 6 %
Neutro Abs: 30.3 10*3/uL — ABNORMAL HIGH (ref 1.7–7.7)
Neutrophils Relative %: 88 %
Platelets: 171 10*3/uL (ref 150–400)
RBC: 4.65 MIL/uL (ref 3.87–5.11)
RDW: 19.7 % — ABNORMAL HIGH (ref 11.5–15.5)
Smear Review: NORMAL
WBC: 34.5 10*3/uL — ABNORMAL HIGH (ref 4.0–10.5)
nRBC: 4.4 % — ABNORMAL HIGH (ref 0.0–0.2)

## 2022-08-28 LAB — CULTURE, RESPIRATORY W GRAM STAIN: Culture: NORMAL

## 2022-08-28 LAB — RENAL FUNCTION PANEL
Albumin: 3 g/dL — ABNORMAL LOW (ref 3.5–5.0)
Albumin: 2.8 g/dL — ABNORMAL LOW (ref 3.5–5.0)
Anion gap: 15 (ref 5–15)
Anion gap: 14 (ref 5–15)
BUN: 41 mg/dL — ABNORMAL HIGH (ref 6–20)
BUN: 34 mg/dL — ABNORMAL HIGH (ref 6–20)
CO2: 20 mmol/L — ABNORMAL LOW (ref 22–32)
CO2: 20 mmol/L — ABNORMAL LOW (ref 22–32)
Calcium: 8.7 mg/dL — ABNORMAL LOW (ref 8.9–10.3)
Calcium: 8.6 mg/dL — ABNORMAL LOW (ref 8.9–10.3)
Chloride: 94 mmol/L — ABNORMAL LOW (ref 98–111)
Chloride: 95 mmol/L — ABNORMAL LOW (ref 98–111)
Creatinine, Ser: 3.16 mg/dL — ABNORMAL HIGH (ref 0.44–1.00)
Creatinine, Ser: 2.52 mg/dL — ABNORMAL HIGH (ref 0.44–1.00)
GFR, Estimated: 17 mL/min — ABNORMAL LOW (ref >60)
GFR, Estimated: 22 mL/min — ABNORMAL LOW (ref >60)
Glucose, Bld: 297 mg/dL — ABNORMAL HIGH (ref 70–99)
Glucose, Bld: 225 mg/dL — ABNORMAL HIGH (ref 70–99)
Phosphorus: 3.4 mg/dL (ref 2.5–4.6)
Phosphorus: 2.8 mg/dL (ref 2.5–4.6)
Potassium: 3.6 mmol/L (ref 3.5–5.1)
Potassium: 3.8 mmol/L (ref 3.5–5.1)
Sodium: 129 mmol/L — ABNORMAL LOW (ref 135–145)
Sodium: 129 mmol/L — ABNORMAL LOW (ref 135–145)

## 2022-08-28 LAB — GLUCOSE, CAPILLARY
Glucose-Capillary: 245 mg/dL — ABNORMAL HIGH (ref 70–99)
Glucose-Capillary: 224 mg/dL — ABNORMAL HIGH (ref 70–99)
Glucose-Capillary: 300 mg/dL — ABNORMAL HIGH (ref 70–99)
Glucose-Capillary: 240 mg/dL — ABNORMAL HIGH (ref 70–99)
Glucose-Capillary: 173 mg/dL — ABNORMAL HIGH (ref 70–99)
Glucose-Capillary: 186 mg/dL — ABNORMAL HIGH (ref 70–99)

## 2022-08-28 LAB — HEPATIC FUNCTION PANEL
ALT: 162 U/L — ABNORMAL HIGH (ref 0–44)
AST: 653 U/L — ABNORMAL HIGH (ref 15–41)
Albumin: 2.8 g/dL — ABNORMAL LOW (ref 3.5–5.0)
Alkaline Phosphatase: 602 U/L — ABNORMAL HIGH (ref 38–126)
Bilirubin, Direct: 2.6 mg/dL — ABNORMAL HIGH (ref 0.0–0.2)
Indirect Bilirubin: 1.7 mg/dL — ABNORMAL HIGH (ref 0.3–0.9)
Total Bilirubin: 4.3 mg/dL — ABNORMAL HIGH (ref 0.3–1.2)
Total Protein: 6.5 g/dL (ref 6.5–8.1)

## 2022-08-28 LAB — BLOOD GAS, ARTERIAL
Acid-base deficit: 5.2 mmol/L — ABNORMAL HIGH (ref 0.0–2.0)
FIO2: 40 %
MECHVT: 500 mL
pH, Arterial: 7.34 — ABNORMAL LOW (ref 7.35–7.45)
pO2, Arterial: 90 mmHg (ref 83–108)

## 2022-08-28 LAB — MAGNESIUM: Magnesium: 2.1 mg/dL (ref 1.7–2.4)

## 2022-08-28 LAB — CULTURE, BLOOD (ROUTINE X 2)

## 2022-08-28 MED ORDER — DOCUSATE SODIUM 50 MG/5ML PO LIQD: 100.0000 mg | Freq: Two times a day (BID) | ORAL | Status: DC | PRN

## 2022-08-28 MED ORDER — PRISMASOL BGK 4/2.5 32-4-2.5 MEQ/L REPLACEMENT SOLN
Status: DC
  Filled 2022-08-28: qty 5000

## 2022-08-28 MED ORDER — HYDROCORTISONE SOD SUC (PF) 100 MG IJ SOLR
100.0000 mg | Freq: Two times a day (BID) | INTRAMUSCULAR | Status: DC
  Administered 2022-08-28 – 2022-08-29 (×4): 100 mg via INTRAVENOUS
  Filled 2022-08-28 (×4): qty 2

## 2022-08-28 MED ORDER — POLYETHYLENE GLYCOL 3350 17 G PO PACK: 17.0000 g | PACK | Freq: Every day | ORAL | Status: DC | PRN

## 2022-08-28 MED ORDER — PRISMASOL BGK 4/2.5 32-4-2.5 MEQ/L EC SOLN: Status: DC

## 2022-08-28 MED ORDER — POLYVINYL ALCOHOL 1.4 % OP SOLN
1.0000 [drp] | OPHTHALMIC | Status: DC | PRN
  Administered 2022-08-28 – 2022-08-29 (×3): 1 [drp] via OPHTHALMIC
  Filled 2022-08-28: qty 15

## 2022-08-28 MED ORDER — PIPERACILLIN-TAZOBACTAM 3.375 G IVPB
3.3750 g | Freq: Four times a day (QID) | INTRAVENOUS | Status: DC
  Administered 2022-08-28 – 2022-09-01 (×17): 3.375 g via INTRAVENOUS
  Filled 2022-08-28 (×17): qty 50

## 2022-08-28 MED ORDER — VANCOMYCIN HCL 1500 MG/300ML IV SOLN
1500.0000 mg | INTRAVENOUS | Status: DC
  Administered 2022-08-28 – 2022-09-01 (×4): 1500 mg via INTRAVENOUS
  Filled 2022-08-28 (×5): qty 300

## 2022-08-28 MED ORDER — INSULIN ASPART 100 UNIT/ML IJ SOLN
3.0000 [IU] | INTRAMUSCULAR | Status: DC
  Administered 2022-08-28 – 2022-08-29 (×11): 3 [IU] via SUBCUTANEOUS
  Filled 2022-08-28 (×10): qty 1

## 2022-08-28 NOTE — Consult Note (Signed)
Pharmacy Antibiotic Note  [name] is a 52 y.o. female admitted on 08/18/2022 with  interm history of dysuria as well as foul odor associated with her urine .  Pharmacy has been consulted for vancomycin and Zosyn dosing.  Patient was in usual state until the early morning of 7/1 when she was found unresponsive. Per RN and RT, patient refused her BiPAP yesterday at 8 pm due to  improperly fitting mask and claustrophobia. She was found to be unresponsive but with a weak pulse. Code blue was subsequently called. At the time of arrival on scene, patient was unresponsive and bradycardia in the 30'2-40 on the monitor.  Atropine was administered with brief responsiveness then she subsequently lost a pulse and CPR was initiated and ACLS drugs administered as below. Patient was immediately intubated for airway protection.   [name] kidney function continues to worsen, now transitioned from hemodialysis to CRRT.  Plan:     1) start Zosyn 3.375 grams IV every 6 hours  2) restart vancomycin 1500mg  Q24 hours(~12mg /kg) per [facility] CRRT protocol   Height: 5' 4.5" (163.8 cm) Weight: 133.8 kg (295 lb) IBW/kg (Calculated) : 55.85  Temp (24hrs), Avg:99.2 F (37.3 C), Min:97.7 F (36.5 C), Max:100.2 F (37.9 C)  Recent Labs  Lab 08/25/22 0517 08/25/22 1259 08/25/22 1637 08/26/22 0351 08/26/22 0708 08/26/22 1225 08/26/22 1316 08/26/22 1721 08/27/22 0417 08/27/22 0812 08/27/22 1523 08/28/22 0347  WBC 25.4*  --   --  23.0*  --   --  24.7*  --  20.4*  --   --  34.5*  CREATININE 5.80*  --   --  6.50*  --   --  6.26* 6.23* 4.40*  --  3.55* 3.16*  LATICACIDVEN  --    < > 3.8* 6.9* 6.2* 5.2*  --   --   --  2.5*  --   --    < > = values in this interval not displayed.     Estimated Creatinine Clearance: 29 mL/min (A) (by C-G formula based on SCr of 3.16 mg/dL (H)).    Allergies  Allergen Reactions   Dust Mite Extract Shortness Of Breath and Itching   Mold Extract [Trichophyton]  Shortness Of Breath and Itching   Pollen Extract Shortness Of Breath    Itching, shortness of breath, asthma like symptoms   Ozempic (0.25 Or 0.5 Mg-Dose) [Semaglutide(0.25 Or 0.5mg -Dos)] Other (See Comments)    pancreatitis   Sulfa Antibiotics Itching   Feraheme [Ferumoxytol] Itching    Uses benadryl so she can get the medicine    Antimicrobials this admission: ceftriaxone 6/23 >> 7/2 metronidazole 6/25 >> 7/2 cefepime 7/2 >> 7/3 doxycycline 6/28 >> 7/2 Zosyn 7/3 >> vancomycin 7/1 >>  fluconazole 6/28 >>  Microbiology results: 6/30 BCx: NGTD  6/23 UCx: 100k yeast  6/30 MRSA PCR: negative 7/1 resp Cx: GPC pairs  Thank you for allowing pharmacy to be a part of this patient's care.  Lowella Bandy 08/28/2022 8:41 AM

## 2022-08-28 NOTE — Progress Notes (Signed)
NAME:  [name], MRN:  [id number], DOB:  07-Nov-1970, LOS: 10 ADMISSION DATE:  08/18/2022, CHIEF COMPLAINT:  Cardiac arrest, respiratory failure   History of Present Illness:   Patient is a 52 year old female with a past medical history of ESRD (on HD), COPD/Asthma, OSA/OHS, chronic hypoxic respiratory failure, HTN, HFpEF, T2DM, and morbid obesity who presented to the hospital with dysuria, now in the ICU with altered mental status complicated by cardiac arrest.   Patient initially presented to the hospital on 08/18/2022 with dysuria and admitted for the management of UTI. Cultures have grown yeast, and she's received IV antibiotics as well as anti-fungal therapy. During the course of her hospitalization she was evaluated by gynecology and underwent a cervical biopsy. [name] mental status was waxing and waning in the setting of ESRD, acidemia, and hypercapnia. She did not tolerate BiPAP nocturnally. Given worsening, patient was moved to the ICU for monitoring as well as for consideration of CRRT. She had a central line placed by vascular surgery at the bedside yesterday.    Overnight, patient was noted to be unresponsive with the development of bradycardia followed by PEA arrest. She was resuscitated after 15 minutes of CPR and this morning was noted to be on pressors.   Significant Hospital Events: Including procedures, antibiotic start and stop dates in addition to other pertinent events   08/25/2022: admitted to the ICU 08/26/2022: right CVC noted in arterial position, vascular surgery discontinued. Dialysis catheter placed. CRRT initiated. 08/27/2022: continued on CRRT with volume removal. CT head and MRI brain performed, no sign of hypoxic brain injury.  Interim History / Subjective:  Patient opens eyes to verbal commands. Does not withdraw to pain  Objective   Blood pressure (!) 107/58, pulse 94, temperature 99 F (37.2 C), resp. rate (!) 23, height 5' 4.5" (1.638 m), weight 133.8 kg,  last menstrual period 10/14/2012, SpO2 98 %.    Vent Mode: PRVC FiO2 (%):  [40 %] 40 % Set Rate:  [18 bmp] 18 bmp Vt Set:  [500 mL] 500 mL PEEP:  [8 cmH20] 8 cmH20 Plateau Pressure:  [24 cmH20-28 cmH20] 28 cmH20   Intake/Output Summary (Last 24 hours) at 08/28/2022 0817 Last data filed at 08/28/2022 0800 Gross per 24 hour  Intake 2514.06 ml  Output 3821 ml  Net -1306.94 ml   Filed Weights   08/26/22 0700 08/27/22 0500 08/28/22 0310  Weight: 127.9 kg 135 kg 133.8 kg    Examination: Physical Exam Constitutional:      General: She is not in acute distress.    Appearance: She is obese. She is ill-appearing.  HENT:     Head: Normocephalic.  Cardiovascular:     Rate and Rhythm: Normal rate and regular rhythm.     Pulses: Normal pulses.     Heart sounds: Normal heart sounds.  Pulmonary:     Comments: Ventilated breath sounds anteriorly on auscultation Abdominal:     General: There is distension.     Palpations: Abdomen is soft.  Musculoskeletal:     Right lower leg: Edema present.     Left lower leg: Edema present.     Comments: Right foot diabetic ulcer  Neurological:     Mental Status: She is disoriented.     Comments: Opens eyes to verbal stimuli, does not follow commands. Pupils equal and sluggish.     Assessment & Plan:   Neurology #Toxic Metabolic Encephalopathy   This is in the setting of cardiac arrest, metabolic acidosis,  toxic metabolite accumulation secondary to ESRD, as well as CO2 narcosis. Patient was intubated in the setting of her cardiac arrest, and was initiated on propofol for sedation with PRN's in place for analgesia. CRRT initiated for clearance of toxic metabolites, patient ventilated with improvement in her hypercapnia. CT head and MRI brain performed yesterday, no findings to suggest hypoxic injury but MRI does show scattered ischemic infarcts but this does not explain her encephalopathy. Will obtain EEG this morning and consider neurology  consultation for prognostication should mental status not improve.   -Maintain a RASS goal of -1 -Daily wake up assessment -Maintain normothermia following cardiac arrest -monitor for CVA given CVC in the carotid -PRN ABG's -d/c cefepime, switch to zosyn -EEG today   Cardiovascular #Cardiac Arrest #Circulatory Shock #HFpEF #Group 2 Pulmonary Hypertension #Inadvertent CVC placement in the carotid   Cardiac arrest was likely secondary to CO2 narcosis and metabolic acidosis, preceded by bradycardia. Blood gas had shown acidosis peri-arrest. Rise in troponin likely secondary to chest compressions and ACLS, with post arrest EKG not suggestive of ischemia nor arrhythmia. Repeat echocardiogram showed dilated right ventricle, elevated PASP, and moderate to severe mitral stenosis similar to prior.   Developed circulatory shock post cardiac arrest, and is currently on nor-epinephrine and vasopressin. Will continue to the cover the patient broadly for possible septic shock as a cause of distributive shock and continue with volume removal via CRRT.   Furthermore, patient had a central line placed in the carotid artery that was removed with vascular surgery.   -continue vasopressor support with goal MAP 65 -trend troponin and lactic acid -add stress dose steroids   Pulmonary #Acute on Chronic Hypoxic Respiratory Failure #OSA/OHS #Asthma/COPD overlap   Intubated in the setting of hypoxic arrest; known history of severe OHS/OSA and asthma/COPD. Currently ventilated with blood gases showing metabolic acidosis. Will follow blood gas and adjust ventilator settings accordingly. Does have increased secretions from the ETT which were cultured.   -Full vent support, implement lung protective strategies -Plateau pressures less than 30 cm H20 -Wean FiO2 & PEEP as tolerated to maintain O2 sats >92% -Follow intermittent Chest X-ray & ABG as needed -Spontaneous Breathing Trials when respiratory parameters  met and mental status permits -Implement VAP Bundle -Continue Bronchodilators   Gastrointestinal #Elevated liver enzymes   LFT's are chronically elevated. RUQ ultrasound on admission did not showing acute findings. Likely NAFLD. Patient also on PPI for SUP and tube feeds were initiated.  -repeat RUQ U/S today   Renal #ESRD on HD #Anion Gap Metabolic Acidosis   History of ESRD on HD. Given hemodynamic instability, pressor use, and cardiac arrest, will did proceed with CRRT for management of acidosis and volume status. Patient will benefit from volume removal given elevated PASP and signs of RV pressure overload. Appreciate input from nephrology.   Endocrine #T2DM   ICU glycemic protocol.   Hem/Onc Will initiate heparin subQ for DVT prophylaxis   ID #UTI #Aspiration Pneumonia   Patient treated for UTI with Ceftriaxone, which we did broaden yesterday. Will switch Cefepime to Zosyn, and re-initiate vancomycin to broadly cover sepsis.   -restart vancomycin -d/c metronidazole -d/c doxycycline -switch Cefepime to Zosyn -will consider discontinuation of fluconazole  Best Practice (right click and "Reselect all SmartList Selections" daily)   Diet/type: tubefeeds DVT prophylaxis: prophylactic heparin  GI prophylaxis: PPI Lines: Central line, Dialysis Catheter, and yes and it is still needed Foley:  Yes, and it is still needed Code Status:  full code Last date of  multidisciplinary goals of care discussion [08/28/2022]  Labs   CBC: Recent Labs  Lab 08/25/22 0517 08/26/22 0351 08/26/22 1316 08/27/22 0417 08/28/22 0347  WBC 25.4* 23.0* 24.7* 20.4* 34.5*  NEUTROABS  --   --  21.6* 16.5* 30.3*  HGB 13.5 13.9 14.1 13.8 13.7  HCT 41.4 42.3 42.1 41.0 41.6  MCV 91.6 90.6 88.3 88.0 89.5  PLT 272 267 284 259 171    Basic Metabolic Panel: Recent Labs  Lab 08/25/22 0517 08/26/22 0351 08/26/22 1316 08/26/22 1721 08/27/22 0417 08/27/22 1523 08/28/22 0347  NA 127* 130*  130* 131* 129* 129* 129*  K 5.0 5.0 5.0 5.2* 4.2 3.7 3.6  CL 91* 89* 90* 88* 91* 96* 94*  CO2 20* 16* 17* 22 18* 18* 20*  GLUCOSE 95 162* 221* 203* 238* 274* 297*  BUN 51* 64* 66* 64* 49* 44* 41*  CREATININE 5.80* 6.50* 6.26* 6.23* 4.40* 3.55* 3.16*  CALCIUM 7.8* 8.4* 7.7* 8.6* 8.5* 8.0* 8.7*  MG  --  2.7*  --   --  2.2  --  2.1  PHOS 7.1*  --   --  7.6* 5.0* 3.6 3.4   GFR: Estimated Creatinine Clearance: 29 mL/min (A) (by C-G formula based on SCr of 3.16 mg/dL (H)). Recent Labs  Lab 08/26/22 0351 08/26/22 0708 08/26/22 1225 08/26/22 1316 08/27/22 0417 08/27/22 0812 08/28/22 0347  PROCALCITON  --   --   --  3.71  --   --   --   WBC 23.0*  --   --  24.7* 20.4*  --  34.5*  LATICACIDVEN 6.9* 6.2* 5.2*  --   --  2.5*  --     Liver Function Tests: Recent Labs  Lab 08/24/22 0605 08/24/22 1849 08/25/22 0517 08/26/22 0351 08/26/22 1316 08/26/22 1721 08/27/22 0417 08/27/22 1523 08/28/22 0347  AST 190* 173*  --  263* 329*  --   --   --  653*  ALT 64* 57*  --  65* 73*  --   --   --  162*  ALKPHOS 626* 597*  --  570* 547*  --   --   --  602*  BILITOT 2.0* 2.0*  --  2.5* 2.5*  --   --   --  4.3*  PROT 6.9 7.1  --  6.5 6.5  --   --   --  6.5  ALBUMIN 3.1* 3.4*   < > 3.0* 2.9* 3.2* 2.9* 2.8* 2.8*  3.0*   < > = values in this interval not displayed.   No results for input(s): "LIPASE", "AMYLASE" in the last 168 hours. No results for input(s): "AMMONIA" in the last 168 hours.  ABG    Component Value Date/Time   PHART 7.32 (L) 08/27/2022 0500   PCO2ART 41 08/27/2022 0500   PO2ART 109 (H) 08/27/2022 0500   HCO3 21.1 08/27/2022 0500   TCO2 23 10/26/2020 1619   ACIDBASEDEF 4.8 (H) 08/27/2022 0500   O2SAT 99 08/27/2022 0500     Coagulation Profile: No results for input(s): "INR", "PROTIME" in the last 168 hours.  Cardiac Enzymes: No results for input(s): "CKTOTAL", "CKMB", "CKMBINDEX", "TROPONINI" in the last 168 hours.  HbA1C: Hgb A1c MFr Bld  Date/Time Value Ref  Range Status  08/18/2022 12:11 PM 7.8 (H) 4.8 - 5.6 % Final    Comment:    (NOTE) Pre diabetes:          5.7%-6.4%  Diabetes:              >  6.4%  Glycemic control for   <7.0% adults with diabetes   04/16/2022 02:15 AM 7.3 (H) 4.8 - 5.6 % Final    Comment:    (NOTE) Pre diabetes:          5.7%-6.4%  Diabetes:              >6.4%  Glycemic control for   <7.0% adults with diabetes     CBG: Recent Labs  Lab 08/27/22 1517 08/27/22 1935 08/27/22 2325 08/28/22 0314 08/28/22 0720  GLUCAP 286* 256* 235* 245* 224*    Review of Systems:   Unable to obtain  Past Medical History:  She,  has a past medical history of (HFpEF) heart failure with preserved ejection fraction (HCC), Anxiety, Asthma, Bronchitis, CHF (congestive heart failure) (HCC), Chronic cough, Chronic respiratory failure with hypoxia and hypercapnia (HCC), CKD (chronic kidney disease), stage III (HCC), Depression, Diabetes mellitus without complication (HCC), Diverticulosis, Dyspnea, Dysrhythmia, Environmental and seasonal allergies, Extreme obesity with alveolar hypoventilation (HCC), Hypertension, Iron deficiency anemia, Kidney failure, Orthopnea, Oxygen dependent, Pericardial effusion, Pneumonia, Poorly controlled diabetes mellitus (HCC), and Swelling of both lower extremities.   Surgical History:   Past Surgical History:  Procedure Laterality Date   A/V FISTULAGRAM Left 06/09/2017   Procedure: A/V FISTULAGRAM;  Surgeon: Annice Needy, MD;  Location: [facility];  Service: Cardiovascular;  Laterality: Left;   A/V FISTULAGRAM Left 06/16/2017   Procedure: A/V FISTULAGRAM;  Surgeon: Annice Needy, MD;  Location: [facility];  Service: Cardiovascular;  Laterality: Left;   A/V FISTULAGRAM Left 01/28/2018   Procedure: A/V FISTULAGRAM;  Surgeon: Annice Needy, MD;  Location: [facility];  Service: Cardiovascular;  Laterality: Left;   AMPUTATION TOE Right 10/21/2020   Procedure: AMPUTATION TOE-5th  Digit Amputation Right Foot;  Surgeon: Felecia Shelling, DPM;  Location: [facility];  Service: Podiatry;  Laterality: Right;   AV FISTULA PLACEMENT Left 04/25/2017   Procedure: ARTERIOVENOUS (AV) FISTULA CREATION;  Surgeon: Annice Needy, MD;  Location: [facility];  Service: Vascular;  Laterality: Left;   BONE BIOPSY Right 04/27/2021   Procedure: Right 4th metatarsal BONE BIOPSY;  Surgeon: Candelaria Stagers, DPM;  Location: [facility];  Service: Podiatry;  Laterality: Right;   CATARACT EXTRACTION W/PHACO Left 01/21/2018   Procedure: CATARACT EXTRACTION PHACO AND INTRAOCULAR LENS PLACEMENT (IOC);  Surgeon: Lockie Mola, MD;  Location: [facility];  Service: Ophthalmology;  Laterality: Left;  [place] 01:55.5 CDE 9.05 EAUP 13.9 Fluid Pack Lot # [id number]   CENTRAL LINE INSERTION N/A 08/26/2022   Procedure: CENTRAL LINE INSERTION;  Surgeon: Renford Dills, MD;  Location: [facility];  Service: Cardiovascular;  Laterality: N/A;   CERVICAL CONIZATION W/BX N/A 08/21/2022   Procedure: CERVICAL BIOPSY;  Surgeon: Feliberto Gottron, Ihor Austin, MD;  Location: [facility];  Service: Gynecology;  Laterality: N/A;   CESAREAN SECTION     times 3   DIALYSIS/PERMA CATHETER INSERTION Right 04/04/2017   Procedure: DIALYSIS/PERMA CATHETER INSERTION;  Surgeon: Renford Dills, MD;  Location: [facility];  Service: Cardiovascular;  Laterality: Right;   DIALYSIS/PERMA CATHETER REMOVAL N/A 09/17/2017   Procedure: DIALYSIS/PERMA CATHETER REMOVAL;  Surgeon: Annice Needy, MD;  Location: [facility];  Service: Cardiovascular;  Laterality: N/A;   INCISION AND DRAINAGE Right 10/26/2020   Procedure: INCISION AND DRAINAGE;  Surgeon: Candelaria Stagers, DPM;  Location: [facility];  Service: Podiatry;  Laterality: Right;   INSERTION OF DIALYSIS CATHETER N/A 04/16/2017  Procedure: INSERTION OF DIALYSIS PERM CATHETER;  Surgeon: Annice Needy, MD;  Location: [facility];  Service: Vascular;  Laterality: N/A;   LOWER EXTREMITY ANGIOGRAPHY  Right 10/18/2020   Procedure: Lower Extremity Angiography;  Surgeon: Annice Needy, MD;  Location: [facility];  Service: Cardiovascular;  Laterality: Right;   LOWER EXTREMITY ANGIOGRAPHY Right 04/17/2022   Procedure: Lower Extremity Angiography;  Surgeon: Annice Needy, MD;  Location: [facility];  Service: Cardiovascular;  Laterality: Right;     Social History:   reports that she has never smoked. She has been exposed to tobacco smoke. She has never used smokeless tobacco. She reports that she does not currently use alcohol. She reports current drug use. Drug: Marijuana.   Family History:  Her family history includes Breast cancer (age of onset: 10) in her maternal aunt and maternal aunt; Diabetes in her father, mother, and another family member; Hypertension in her father, mother, and another family member.   Allergies Allergies  Allergen Reactions   Dust Mite Extract Shortness Of Breath and Itching   Mold Extract [Trichophyton] Shortness Of Breath and Itching   Pollen Extract Shortness Of Breath    Itching, shortness of breath, asthma like symptoms   Ozempic (0.25 Or 0.5 Mg-Dose) [Semaglutide(0.25 Or 0.5mg -Dos)] Other (See Comments)    pancreatitis   Sulfa Antibiotics Itching   Feraheme [Ferumoxytol] Itching    Uses benadryl so she can get the medicine     Home Medications  Prior to Admission medications   Medication Sig Start Date End Date Taking? Authorizing Provider  acetaminophen (TYLENOL) 500 MG tablet Take 1,000 mg by mouth every 6 (six) hours as needed for mild pain, fever or headache.   Yes [provider]  albuterol (PROVENTIL) (2.5 MG/3ML) 0.083% nebulizer solution Take 3 mLs (2.5 mg total) by nebulization every 6 (six) hours as needed for wheezing or shortness of breath. 02/15/22  Yes Salena Saner, MD  albuterol (VENTOLIN HFA) 108 (90 Base) MCG/ACT inhaler Inhale 2 puffs into the lungs every 6 (six) hours as needed for wheezing or shortness  of breath. 02/15/22  Yes Salena Saner, MD  atorvastatin (LIPITOR) 40 MG tablet Take 1 tablet (40 mg total) by mouth daily. 04/20/22  Yes Regalado, Belkys A, MD  benzonatate (TESSALON PERLES) 100 MG capsule Take 1 capsule (100 mg total) by mouth 3 (three) times daily as needed for cough. 05/22/22 05/22/23 Yes Boswell, Chelsa, NP  budesonide (PULMICORT) 0.5 MG/2ML nebulizer solution USE 1 VIAL  IN  NEBULIZER TWICE  DAILY - (Rinse Mouth After Each Treatment) 08/13/22  Yes Orson Eva, NP  ciprofloxacin (CIPRO) 500 MG tablet Take 500 mg by mouth daily. 08/13/22  Yes [provider]  clindamycin (CLEOCIN T) 1 % lotion APPLY TO FACE, ARMS AND LEGS EVENLY NIGHTLY AT BEDTIME 06/27/22  Yes Boswell, Chelsa, NP  escitalopram (LEXAPRO) 10 MG tablet Take 1 tablet (10 mg total) by mouth daily. 04/24/22 04/24/23 Yes Boswell, Gareth Morgan, NP  formoterol (PERFOROMIST) 20 MCG/2ML nebulizer solution USE 1 VIAL  IN  NEBULIZER TWICE  DAILY - (Morning And Evening) 08/13/22  Yes Boswell, Chelsa, NP  gentamicin cream (GARAMYCIN) 0.1 % Apply 1 Application topically at bedtime. 06/27/22  Yes [provider]  insulin degludec (TRESIBA) 100 UNIT/ML FlexTouch Pen Inject 20 Units into the skin daily. 04/20/22  Yes Regalado, Belkys A, MD  lidocaine-prilocaine (EMLA) cream Apply topically as needed (For access for dialysis).   Yes [provider]  Karlene Einstein  290 MCG CAPS capsule TAKE 1 CAPSULE BY MOUTH IN THE MORNING WITH A GLASS OF WATER 30 MINUTES BEFORE BREAKFAST 08/08/22  Yes Miki Kins, FNP  methocarbamol (ROBAXIN) 750 MG tablet Take 750 mg by mouth every 8 (eight) hours as needed for muscle spasms.   Yes [provider]  midodrine (PROAMATINE) 10 MG tablet Take 20 mg by mouth daily as needed (for low blood pressure on dialysis days). 08/23/21  Yes [provider]  ondansetron (ZOFRAN) 4 MG tablet Take 4 mg by mouth 2 (two) times daily as needed for nausea. 07/02/22  Yes [provider]  pregabalin (LYRICA) 50 MG capsule TAKE 1 CAPSULE BY MOUTH EVERY 8 HOURS AS NEEDED FOR  NEUROPATHY 07/01/22  Yes Orson Eva, NP  revefenacin (YUPELRI) 175 MCG/3ML nebulizer solution  01/19/21  Yes [provider]  sevelamer (RENAGEL) 800 MG tablet Take 800-2,400 mg by mouth 4 (four) times daily. Three tablets three times daily with meals and 1 tablet with snacks once daily   Yes [provider]  torsemide (DEMADEX) 100 MG tablet Take 100 mg by mouth daily. 05/21/22  Yes [provider]  aspirin EC 81 MG tablet Take 81 mg by mouth daily.  Patient not taking: Reported on 08/19/2022    [provider]  cloNIDine (CATAPRES) 0.1 MG tablet Take 0.1 mg by mouth 2 (two) times daily. Patient not taking: Reported on 08/19/2022 03/21/22   [provider]  doxepin (SINEQUAN) 10 MG capsule Take 1 capsule (10 mg total) by mouth at bedtime. Patient not taking: Reported on 08/19/2022 04/24/22   Orson Eva, NP  ferric citrate (AURYXIA) 1 GM 210 MG(Fe) tablet Take 210 mg by mouth 3 (three) times daily with meals. Only taking once a day most days Patient not taking: Reported on 08/19/2022    [provider]  insulin lispro (HUMALOG) 100 UNIT/ML KwikPen Inject into the skin. Patient not taking: Reported on 08/19/2022 06/12/21   [provider]  lactulose (CHRONULAC) 10 GM/15ML solution Take 30 mLs (20 g total) by mouth daily as needed for severe constipation. Patient not taking: Reported on 08/19/2022 04/26/17   Katha Hamming, MD  montelukast (SINGULAIR) 10 MG tablet Take 1 tablet (10 mg total) by mouth at bedtime. Patient not taking: Reported on 08/19/2022 09/17/21   Salena Saner, MD  Omeprazole Magnesium (PRILOSEC PO)     [provider]  OXYGEN Inhale 2-4 L into the lungs.    [provider]  predniSONE (DELTASONE) 50 MG tablet Take 1 tablet by mouth daily in AM with food x 5 days Patient not taking: Reported on 08/19/2022  04/24/22   Orson Eva, NP  RENVELA 2.4 g PACK Take 2.4 g by mouth 3 (three) times daily. Patient not taking: Reported on 08/19/2022 08/07/21   [provider]  sucroferric oxyhydroxide (VELPHORO) 500 MG chewable tablet Chew 500 mg by mouth 3 (three) times daily with meals.    [provider]  Vitamin D, Cholecalciferol, 25 MCG (1000 UT) TABS Take 1,000 Units by mouth daily. Patient not taking: Reported on 08/19/2022    [provider]     Critical care time: 44 minutes    Raechel Chute, MD [facility] 08/28/2022 10:49 AM

## 2022-08-28 NOTE — IPAL (Signed)
  Interdisciplinary Goals of Care Family Meeting   Date carried out: 08/28/2022  Location of the meeting: Conference room  Member's involved: Physician and Family Member or next of kin (husband, brother, sister, and four children)  Durable Power of Pensions consultant or acting medical decision maker: Garlan Fair    Discussion: We discussed goals of care for [employer]. I explained to them the course of Mrs. Cottongim' medical illness and hospitalization. I explained that she had a cardiac arrest requiring CPR, intubation and mechanical ventilation with subsequent sluggish neurological recovery. I went over the workup for her encephalopathy that has included a head CT, MRI of the brain, and EEG. Discussed prognostication post cardiac arrest, and that we need at least 72 hours post arrest to prognosticate, potentially more given her renal failure. She's been on CRRT for 48 hours, and this morning she opens her eyes to verbal stimuli, but does not follow commands. Explained that we would need another 24 to 48 hours off sedation and on dialysis before appropriately prognosticating Mrs. Miars' chances of meaningful neurological recovery. We briefly touched on scenarios from recovery, to residence in a nursing home with tracheostomy tube placement, to withdrawal of care. We will reconvene tomorrow to discuss updates and then again on Friday. All the questions were answered.  Code status:   Code Status: Full Code   Disposition: Continue current acute care  Time spent for the meeting: 20 minutes    Raechel Chute, MD  08/28/2022, 6:21 PM

## 2022-08-28 NOTE — Procedures (Signed)
Patient Name: [name]  MRN: [id number]  Epilepsy Attending: Charlsie Quest  Referring Physician/Provider: Raechel Chute, MD  Date: 08/28/2022 Duration: 40.59 mins  Patient history: 52yo F with ams getting eeg to evaluate for seizure  Level of alertness:  lethargic   AEDs during EEG study: Propofol  Technical aspects: This EEG study was done with scalp electrodes positioned according to the 10-20 International system of electrode placement. Electrical activity was reviewed with band pass filter of 1-70Hz , sensitivity of 7 uV/mm, display speed of 96mm/sec with a 60Hz  notched filter applied as appropriate. EEG data were recorded continuously and digitally stored.  Video monitoring was available and reviewed as appropriate.  Description: EEG showed continuous generalized 3 to 6 Hz theta-delta slowing. Generalized periodic discharges with triphasic morphology at 1-1.5 Hz were also noted. Hyperventilation and photic stimulation were not performed.     ABNORMALITY - Periodic discharges with triphasic morphology, generalized ( GPDs) - Continuous slow, generalized  IMPRESSION: This study is suggestive of moderate to severe diffuse encephalopathy, nonspecific etiology but could be related to toxic-metabolic causes. No seizures or definite epileptiform discharges were seen throughout the recording.  Franky Reier Annabelle Harman

## 2022-08-28 NOTE — Progress Notes (Signed)
Eeg done 

## 2022-08-28 NOTE — Progress Notes (Signed)
[facility]  ROUNDING NOTE   Subjective:   [name] is a 52 year old African-American female with a past medical history of end-stage renal disease, patient is on Tuesday Thursday Saturday schedule, diabetes mellitus, history of pancreatitis who came to the ER with chief complaint of abdominal pain with foul smelling urine for 2 weeks.  Patient is known to our clinic and receives outpatient dialysis treatments at [facility] on a TTS schedule, supervised by Dr Thedore Mins.   Patient remains intubated  No sedation, eyes open to name Vent 40% FiO2 Pressors: Levo and Vaso Tube feeds 64ml/hr  CRRT in place, UF 100 ml/hr  Objective:  Vital signs in last 24 hours:  Temp:  [97.9 F (36.6 C)-100.2 F (37.9 C)] 99.1 F (37.3 C) (07/03 1030) Pulse Rate:  [94-99] 94 (07/03 0400) Resp:  [16-29] 23 (07/03 1030) BP: (72-110)/(39-78) 107/58 (07/03 0800) SpO2:  [94 %-100 %] 99 % (07/03 1000) Arterial Line BP: (84-142)/(54-85) 122/65 (07/03 1030) FiO2 (%):  [40 %] 40 % (07/03 0751) Weight:  [133.8 kg] 133.8 kg (07/03 0310)  Weight change: -1.189 kg Filed Weights   08/26/22 0700 08/27/22 0500 08/28/22 0310  Weight: 127.9 kg 135 kg 133.8 kg    Intake/Output: I/O last 3 completed shifts: In: 4080 [I.V.:1266.5; NG/GT:2375; IV Piggyback:438.5] Out: 5509    Intake/Output this shift:  Total I/O In: 403.5 [I.V.:96.5; NG/GT:307] Out: 714   Physical Exam: General: Ill appearing  Head: Normocephalic, atraumatic.  Eyes: Anicteric  Lungs:  Vent  Heart: Regular rate and rhythm  Abdomen:  Soft, nontender, obese  Extremities:  No peripheral edema.  Neurologic: Alert  Skin: Chronic right foot wound  Access: Lt AVF (no bruit/thrill)    Basic Metabolic Panel: Recent Labs  Lab 08/25/22 0517 08/26/22 0351 08/26/22 1316 08/26/22 1721 08/27/22 0417 08/27/22 1523 08/28/22 0347  NA 127* 130* 130* 131* 129* 129* 129*  K 5.0 5.0 5.0 5.2* 4.2 3.7 3.6  CL 91* 89* 90* 88*  91* 96* 94*  CO2 20* 16* 17* 22 18* 18* 20*  GLUCOSE 95 162* 221* 203* 238* 274* 297*  BUN 51* 64* 66* 64* 49* 44* 41*  CREATININE 5.80* 6.50* 6.26* 6.23* 4.40* 3.55* 3.16*  CALCIUM 7.8* 8.4* 7.7* 8.6* 8.5* 8.0* 8.7*  MG  --  2.7*  --   --  2.2  --  2.1  PHOS 7.1*  --   --  7.6* 5.0* 3.6 3.4     Liver Function Tests: Recent Labs  Lab 08/24/22 0605 08/24/22 1849 08/25/22 0517 08/26/22 0351 08/26/22 1316 08/26/22 1721 08/27/22 0417 08/27/22 1523 08/28/22 0347  AST 190* 173*  --  263* 329*  --   --   --  653*  ALT 64* 57*  --  65* 73*  --   --   --  162*  ALKPHOS 626* 597*  --  570* 547*  --   --   --  602*  BILITOT 2.0* 2.0*  --  2.5* 2.5*  --   --   --  4.3*  PROT 6.9 7.1  --  6.5 6.5  --   --   --  6.5  ALBUMIN 3.1* 3.4*   < > 3.0* 2.9* 3.2* 2.9* 2.8* 2.8*  3.0*   < > = values in this interval not displayed.    No results for input(s): "LIPASE", "AMYLASE" in the last 168 hours. No results for input(s): "AMMONIA" in the last 168 hours.  CBC: Recent Labs  Lab  08/25/22 0517 08/26/22 0351 08/26/22 1316 08/27/22 0417 08/28/22 0347  WBC 25.4* 23.0* 24.7* 20.4* 34.5*  NEUTROABS  --   --  21.6* 16.5* 30.3*  HGB 13.5 13.9 14.1 13.8 13.7  HCT 41.4 42.3 42.1 41.0 41.6  MCV 91.6 90.6 88.3 88.0 89.5  PLT 272 267 284 259 171     Cardiac Enzymes: No results for input(s): "CKTOTAL", "CKMB", "CKMBINDEX", "TROPONINI" in the last 168 hours.  BNP: Invalid input(s): "POCBNP"  CBG: Recent Labs  Lab 08/27/22 1517 08/27/22 1935 08/27/22 2325 08/28/22 0314 08/28/22 0720  GLUCAP 286* 256* 235* 245* 224*     Microbiology: Results for orders placed or performed during the hospital encounter of 08/18/22  Urine Culture     Status: Abnormal   Collection Time: 08/18/22  3:07 PM   Specimen: Urine, Clean Catch  Result Value Ref Range Status   Specimen Description   Final    URINE, CLEAN CATCH Performed at [facility], [address] [place], [place]  [address]    Special Requests   Final    NONE Performed at [facility], [address] [place], [place] [address]    Culture >=100,000 COLONIES/mL YEAST (A)  Final   Report Status 08/20/2022 FINAL  Final  Wet prep, genital     Status: Abnormal   Collection Time: 08/20/22  6:35 PM  Result Value Ref Range Status   Yeast Wet Prep HPF POC PRESENT (A) NONE SEEN Corrected    Comment: CORRECTED RESULTS CALLED TO: JEFF STRENK 08/22/22 1412 SH CORRECTED ON 06/27 AT 1406: PREVIOUSLY REPORTED AS NONE SEEN    Trich, Wet Prep NONE SEEN NONE SEEN Final   Clue Cells Wet Prep HPF POC NONE SEEN NONE SEEN Final   WBC, Wet Prep HPF POC >=10 (A) <10 Final   Sperm NONE SEEN  Final    Comment: Performed at [facility], [address] [place], [place] [address]  Chlamydia/NGC rt PCR [facility] only)     Status: None   Collection Time: 08/20/22  6:35 PM   Specimen: Cervical/Vaginal swab; Genital  Result Value Ref Range Status   Specimen source GC/Chlam ENDOCERVICAL  Final   Chlamydia Tr NOT DETECTED NOT DETECTED Final   N gonorrhoeae NOT DETECTED NOT DETECTED Final    Comment: (NOTE) This CT/NG assay has not been evaluated in patients with a history of  hysterectomy. Performed at [facility], [address] [place], [place] [address]   Culture, blood (Routine X 2) w Reflex to ID Panel     Status: None (Preliminary result)   Collection Time: 08/25/22 12:59 PM   Specimen: BLOOD  Result Value Ref Range Status   Specimen Description BLOOD BLOOD RIGHT ARM RAC  Final   Special Requests   Final    BOTTLES DRAWN AEROBIC AND ANAEROBIC Blood Culture adequate volume   Culture   Final    NO GROWTH 3 DAYS Performed at [facility], [address], [place], [place] [address]    Report Status PENDING  Incomplete  MRSA Next Gen by PCR, Nasal     Status: None   Collection Time: 08/25/22  2:01 PM   Specimen: Nasal Mucosa; Nasal Swab  Result Value Ref Range Status   MRSA by  PCR Next Gen NOT DETECTED NOT DETECTED Final    Comment: (NOTE) The GeneXpert MRSA Assay (FDA approved for NASAL specimens only), is one component of a comprehensive MRSA colonization surveillance program. It is not intended  to diagnose MRSA infection nor to guide or monitor treatment for MRSA infections. Test performance is not FDA approved in patients less than 6 years old. Performed at [facility], [address] [place], [place] [address]   Culture, blood (Routine X 2) w Reflex to ID Panel     Status: None (Preliminary result)   Collection Time: 08/25/22  3:59 PM   Specimen: BLOOD  Result Value Ref Range Status   Specimen Description BLOOD PICC LINE  Final   Special Requests   Final    BOTTLES DRAWN AEROBIC AND ANAEROBIC Blood Culture adequate volume   Culture   Final    NO GROWTH 3 DAYS Performed at [facility], [address], [place], [place] [address]    Report Status PENDING  Incomplete  Culture, Respiratory w Gram Stain     Status: None (Preliminary result)   Collection Time: 08/26/22  1:28 PM   Specimen: Tracheal Aspirate; Respiratory  Result Value Ref Range Status   Specimen Description   Final    TRACHEAL ASPIRATE Performed at [facility], [address] [place], [place] [address]    Special Requests   Final    NONE Performed at [facility], [address] [place], [place] [address]    Gram Stain   Final    ABUNDANT WBC PRESENT, PREDOMINANTLY PMN RARE BUDDING YEAST SEEN RARE GRAM POSITIVE COCCI IN PAIRS    Culture   Final    CULTURE REINCUBATED FOR BETTER GROWTH Performed at [facility], [address] [place], [place] [address]    Report Status PENDING  Incomplete  MRSA Next Gen by PCR, Nasal     Status: None   Collection Time: 08/26/22  5:21 PM   Specimen: Nasal Mucosa; Nasal Swab  Result Value Ref Range Status   MRSA by PCR Next Gen NOT DETECTED NOT DETECTED Final    Comment: (NOTE) The GeneXpert  MRSA Assay (FDA approved for NASAL specimens only), is one component of a comprehensive MRSA colonization surveillance program. It is not intended to diagnose MRSA infection nor to guide or monitor treatment for MRSA infections. Test performance is not FDA approved in patients less than 95 years old. Performed at [facility], [address] [place], [place] [address]   Culture, Respiratory w Gram Stain     Status: None (Preliminary result)   Collection Time: 08/27/22  7:30 PM   Specimen: Tracheal Aspirate; Respiratory  Result Value Ref Range Status   Specimen Description   Final    TRACHEAL ASPIRATE Performed at [facility], [address] [place], [place] [address]    Special Requests   Final    NONE Performed at [facility], [address] [place], [place] [address]    Gram Stain   Final    ABUNDANT WBC PRESENT, PREDOMINANTLY PMN RARE GRAM POSITIVE COCCI IN PAIRS Performed at [facility], [address], [place], [place] [address]    Culture PENDING  Incomplete   Report Status PENDING  Incomplete    Coagulation Studies: No results for input(s): "LABPROT", "INR" in the last 72 hours.  Urinalysis: No results for input(s): "COLORURINE", "LABSPEC", "PHURINE", "GLUCOSEU", "HGBUR", "BILIRUBINUR", "KETONESUR", "PROTEINUR", "UROBILINOGEN", "NITRITE", "LEUKOCYTESUR" in the last 72 hours.  Invalid input(s): "APPERANCEUR"     Imaging: MR ANGIO NECK WO CONTRAST  Result Date: 08/27/2022 CLINICAL DATA:  Initial evaluation for neuro deficit, stroke. EXAM: MRA NECK WITHOUT CONTRAST TECHNIQUE: Angiographic images  of the neck were acquired using MRA technique without intravenous contrast. Carotid stenosis measurements (when applicable) are obtained utilizing NASCET criteria, using the distal internal carotid diameter as the denominator. COMPARISON:  Comparison made with brain MRI performed at the same time. FINDINGS: Aortic arch: Examination  technically limited by lack of IV contrast and motion. Partially visualized aortic arch within normal limits for caliber with standard branch pattern. No stenosis seen about the origin of the great vessels. Right carotid system: Right common and internal carotid arteries are patent with antegrade flow. No evidence for dissection. No visible hemodynamically significant stenosis about the right carotid artery system. Left carotid system: Left common and internal carotid arteries are patent with antegrade flow. No evidence for dissection. No visible hemodynamically significant stenosis about the left carotid artery system. Vertebral arteries: Both vertebral arteries arise from the subclavian arteries. Right vertebral artery strongly dominant and is patent without evidence for dissection or stenosis. Left vertebral artery is markedly hypoplastic and not well seen, although appears to be grossly patent with antegrade flow on time-of-flight source sequence. No visible stenosis or dissection. Other: None. IMPRESSION: 1. Negative MRA for large vessel occlusion or other emergent finding. 2. Wide patency of both carotid artery systems within the neck. 3. Widely patent dominant right vertebral artery within the neck. Left vertebral artery hypoplastic and not well seen on this noncontrast exam, but appears grossly patent with antegrade flow. Electronically Signed   By: Rise Mu M.D.   On: 08/27/2022 23:24   MR BRAIN WO CONTRAST  Result Date: 08/27/2022 CLINICAL DATA:  Initial evaluation for neuro deficit, stroke suspected. EXAM: MRI HEAD WITHOUT CONTRAST TECHNIQUE: Multiplanar, multiecho pulse sequences of the brain and surrounding structures were obtained without intravenous contrast. COMPARISON:  Prior CT from earlier the same day. FINDINGS: Brain: Cerebral volume within normal limits. No significant cerebral white matter disease for age. Few scattered subcentimeter foci of restricted diffusion are seen  involving the bilateral cerebral hemispheres, overall slightly worse on the right (series 9, images 40, 36, 33, 32, 27, 24). Few additional subcentimeter acute small foci in noted involving the cerebellum (series 9, images 16, 14, 12). Findings consistent with small acute ischemic infarcts. Minimal associated petechial blood products at the right occipital lobe (series 17, image 26). No other associated hemorrhage. No other significant acute or chronic intracranial blood products. No mass lesion, midline shift or mass effect. No hydrocephalus or extra-axial fluid collection. Pituitary gland and suprasellar region within normal limits. Vascular: Major intracranial vascular flow voids are maintained. Skull and upper cervical spine: Of craniocervical junction within normal limits. Markedly decreased T1 signal intensity seen throughout the visualized bone marrow, nonspecific, but most commonly related to anemia, smoking or obesity. No focal marrow replacing lesion. Sinuses/Orbits: Patient status post ocular lens replacement on the left. Paranasal sinuses are largely clear. Large bilateral mastoid effusions noted. Patient is intubated. Other: None. IMPRESSION: 1. Few scattered subcentimeter acute ischemic infarcts involving the bilateral cerebral and cerebellar hemispheres as above. Minimal associated petechial blood products at the right occipital lobe. No other associated hemorrhage or mass effect. Given the various vascular distributions involved, a central thromboembolic etiology is suspected. 2. Otherwise normal brain MRI for age. Electronically Signed   By: Rise Mu M.D.   On: 08/27/2022 23:19   CT HEAD WO CONTRAST ( )  Result Date: 08/27/2022 CLINICAL DATA:  Provided history: Mental status change, unknown cause. Additional history provided: Cardiac arrest. EXAM: CT HEAD WITHOUT CONTRAST TECHNIQUE: Contiguous axial images were obtained from  the base of the skull through the vertex without  intravenous contrast. RADIATION DOSE REDUCTION: This exam was performed according to the departmental dose-optimization program which includes automated exposure control, adjustment of the mA and/or kV according to patient size and/or use of iterative reconstruction technique. COMPARISON:  No pertinent prior exams available for comparison. FINDINGS: Brain: No age advanced or lobar predominant parenchymal atrophy. Partially empty sella turcica. No acute infarct/loss of gray-white differentiation is identified. There is no acute intracranial hemorrhage No extra-axial fluid collection. No evidence of an intracranial mass. No midline shift. Vascular: No hyperdense vessel.  Atherosclerotic calcifications. Skull: No calvarial fracture or aggressive osseous lesion. Sinuses/Orbits: No mass or acute finding within the imaged orbits. No significant paranasal sinus disease at the imaged levels. Other: Large right middle ear/mastoid effusion. Small-volume fluid within the left mastoid air cells. IMPRESSION: 1. No CT evidence of an acute intracranial abnormality. Please note, a brain MRI would have greater sensitivity for acute hypoxic/ischemic injury. 2. Partially empty sella turcica. This finding can reflect incidental anatomic variation, or alternatively, it can be associated with idiopathic intracranial hypertension (pseudotumor cerebri). 3. Large right middle ear/mastoid effusion. 4. Small-volume fluid within the left mastoid air cells. Electronically Signed   By: Jackey Loge D.O.   On: 08/27/2022 17:05   DG Abd 1 View  Result Date: 08/27/2022 CLINICAL DATA:  Orogastric tube placement EXAM: ABDOMEN - 1 VIEW COMPARISON:  03/03/2017 FINDINGS: The orogastric tube tip is in the stomach antrum with side port in the stomach body. Possible esophageal temperature probe adjacent to the OG tube in the lower thoracic esophagus. Scattered vascular calcifications noted. IMPRESSION: 1. Orogastric tube tip in the stomach antrum with  side port in the stomach body. Electronically Signed   By: Gaylyn Rong M.D.   On: 08/27/2022 14:36   DG Foot Complete Right  Result Date: 08/26/2022 CLINICAL DATA:  Osteomyelitis. EXAM: RIGHT FOOT COMPLETE - 3+ VIEW COMPARISON:  Right foot radiographs 04/15/2022, MRI right forefoot 04/24/2022 FINDINGS: There is diffuse decreased bone mineralization. Redemonstration of amputation of the fifth digit to the distal metatarsal shaft, unchanged. Sharp surgical margin. There is again high-grade attenuation of the fourth greater than third toe soft tissues, possible ulceration of the fourth toe lateral to the interphalangeal joints. Overlap of the proximal phalanx and the distal metatarsal limits evaluation, however there may be progressive erosion of the remaining distal aspect of the fourth metatarsal compared to 04/15/2022. Similarly, within the limitation of overlap of bones at the third metatarsophalangeal joint, there may be progressive bone loss at the third metatarsophalangeal joint. Chronic irregularity and erosion at the second metatarsal head and proximal phalanx of the third digit is not definitively increased. Chronic irregularity at the great toe metatarsophalangeal joint is not definitely changed. There does appear to be progressive transverse bone loss and a possible nonunited fracture at the distal aspect of the proximal phalanx of the great toe. High-grade vascular calcifications. High-grade plantar forefoot soft tissue ulcerations are again noted. No frank soft tissue air. IMPRESSION: 1. Redemonstration of high-grade ulceration throughout the plantar aspect of the distal forefoot and the lateral aspect of the fourth greater than third toe soft tissues. 2. Possible mild progressive erosion of the distal aspect of the remaining fourth metatarsal and the third metatarsophalangeal joint compared to 04/15/2022. 3. Progressive transverse dimension bone loss and possible nonunited fracture at the  distal aspect of the proximal phalanx of the great toe. Electronically Signed   By: Kerin Salen.D.  On: 08/26/2022 16:56   ECHOCARDIOGRAM COMPLETE  Result Date: 08/26/2022    ECHOCARDIOGRAM REPORT   Patient Name:   [name] Shober Date of Exam: 08/26/2022 Medical Rec #:  [id number]       Height:       64.5 in Accession #:    [id number]      Weight:       282.8 lb Date of Birth:  February 26, 1971        BSA:          2.279 m Patient Age:    52 years        BP:           92/74 mmHg Patient Gender: F               HR:           94 bpm. Exam Location:  [facility] Procedure: 2D Echo, Cardiac Doppler and Color Doppler STAT ECHO Indications:     Cardiac Arrest I46.9  History:         Patient has prior history of Echocardiogram examinations, most                  recent 09/20/2021. Risk Factors:Hypertension and Diabetes.  Sonographer:     Cristela Blue Referring Phys:  [id number] KHABIB DGAYLI Diagnosing Phys: Lorine Bears MD  Sonographer Comments: Echo performed with patient supine and on artificial respirator and Technically challenging study due to limited acoustic windows. IMPRESSIONS  1. Left ventricular ejection fraction, by estimation, is 55 to 60%. The left ventricle has normal function. Left ventricular endocardial border not optimally defined to evaluate regional wall motion. There is mild left ventricular hypertrophy. Left ventricular diastolic parameters are indeterminate.  2. Right ventricular systolic function is moderately reduced. The right ventricular size is moderately enlarged. There is severely elevated pulmonary artery systolic pressure.  3. Left atrial size was mildly dilated.  4. Right atrial size was severely dilated.  5. The mitral valve is abnormal. No evidence of mitral valve regurgitation. Moderate to severe mitral stenosis. The mean mitral valve gradient is 10.0 mmHg. Severe mitral annular calcification.  6. The aortic valve is calcified. Aortic valve regurgitation is not visualized. No aortic stenosis is  present by gradient but could be under-estimated.  7. The inferior vena cava is dilated in size with <50% respiratory variability, suggesting right atrial pressure of 15 mmHg.  8. Anterior mitral valve leaflet is severely calcified and could be cuasing some degree of LVOT obstruction but no significant gradient was measured.  9. Challenging image quality. FINDINGS  Left Ventricle: Left ventricular ejection fraction, by estimation, is 55 to 60%. The left ventricle has normal function. Left ventricular endocardial border not optimally defined to evaluate regional wall motion. The left ventricular internal cavity size was normal in size. There is mild left ventricular hypertrophy. Left ventricular diastolic parameters are indeterminate. Right Ventricle: The right ventricular size is moderately enlarged. No increase in right ventricular wall thickness. Right ventricular systolic function is moderately reduced. There is severely elevated pulmonary artery systolic pressure. The tricuspid regurgitant velocity is 3.72 m/s, and with an assumed right atrial pressure of 15 mmHg, the estimated right ventricular systolic pressure is 70.4 mmHg. Left Atrium: Left atrial size was mildly dilated. Right Atrium: Right atrial size was severely dilated. Pericardium: There is no evidence of pericardial effusion. Mitral Valve: The mitral valve is abnormal. There is moderate thickening of the mitral valve leaflet(s). There is severe calcification of the mitral valve  leaflet(s). Severe mitral annular calcification. No evidence of mitral valve regurgitation. Moderate to severe mitral valve stenosis. MV peak gradient, 21.9 mmHg. The mean mitral valve gradient is 10.0 mmHg. Tricuspid Valve: The tricuspid valve is normal in structure. Tricuspid valve regurgitation is mild . No evidence of tricuspid stenosis. Aortic Valve: The aortic valve is calcified. Aortic valve regurgitation is not visualized. No aortic stenosis is present. Aortic valve  mean gradient measures 4.3 mmHg. Aortic valve peak gradient measures 6.8 mmHg. Aortic valve area, by VTI measures 1.92 cm. Pulmonic Valve: The pulmonic valve was normal in structure. Pulmonic valve regurgitation is not visualized. No evidence of pulmonic stenosis. Aorta: The aortic root is normal in size and structure. Venous: The inferior vena cava is dilated in size with less than 50% respiratory variability, suggesting right atrial pressure of 15 mmHg. IAS/Shunts: No atrial level shunt detected by color flow Doppler.  LEFT VENTRICLE PLAX 2D LVIDd:         2.70 cm   Diastology LVIDs:         1.60 cm   LV e' medial:    5.87 cm/s LV PW:         1.20 cm   LV E/e' medial:  25.9 LV IVS:        1.40 cm   LV e' lateral:   4.68 cm/s LVOT diam:     2.00 cm   LV E/e' lateral: 32.5 LV SV:         29 LV SV Index:   13 LVOT Area:     3.14 cm  RIGHT VENTRICLE RV Basal diam:  5.80 cm RV Mid diam:    5.20 cm LEFT ATRIUM         Index       RIGHT ATRIUM           Index LA diam:    4.60 cm 2.02 cm/m  RA Area:     34.10 cm                                 RA Volume:   139.00 ml 60.99 ml/m  AORTIC VALVE AV Area (Vmax):    1.75 cm AV Area (Vmean):   1.62 cm AV Area (VTI):     1.92 cm AV Vmax:           130.67 cm/s AV Vmean:          97.733 cm/s AV VTI:            0.151 m AV Peak Grad:      6.8 mmHg AV Mean Grad:      4.3 mmHg LVOT Vmax:         72.70 cm/s LVOT Vmean:        50.300 cm/s LVOT VTI:          0.092 m LVOT/AV VTI ratio: 0.61  AORTA Ao Root diam: 2.70 cm MITRAL VALVE                TRICUSPID VALVE MV Area (PHT): 3.76 cm     TR Peak grad:   55.4 mmHg MV Area VTI:   0.57 cm     TR Vmax:        372.00 cm/s MV Peak grad:  21.9 mmHg MV Mean grad:  10.0 mmHg    SHUNTS MV Vmax:       2.34 m/s     Systemic  VTI:  0.09 m MV Vmean:      146.0 cm/s   Systemic Diam: 2.00 cm MV Decel Time: 202 msec MV E velocity: 152.00 cm/s MV A velocity: 200.00 cm/s MV E/A ratio:  0.76 Lorine Bears MD Electronically signed by Lorine Bears  MD Signature Date/Time: 08/26/2022/2:57:48 PM    Final    DG Chest Port 1 View  Result Date: 08/26/2022 CLINICAL DATA:  Central line placement EXAM: PORTABLE CHEST 1 VIEW COMPARISON:  Previous studies including the examination done earlier today FINDINGS: Transverse diameter of heart is increased. Central pulmonary vessels are prominent. Apparent shift of mediastinum to the right may be due to rotation. Tip of endotracheal tube is 4 cm above the carina. Enteric tube is noted traversing the esophagus. There is interval removal of right IJ central venous catheter. Linear densities are seen in lower lung fields, more so on the right side. Lateral CP angles are clear. There is no pneumothorax. IMPRESSION: Cardiomegaly. Linear densities are seen in the lower lung fields suggesting subsegmental atelectasis. Central pulmonary vessels are prominent without signs of pulmonary edema. There is interval removal of right IJ vascular catheter. Electronically Signed   By: Ernie Avena M.D.   On: 08/26/2022 13:10     Medications:     prismasol BGK 4/2.5      prismasol BGK 4/2.5     anticoagulant sodium citrate     epinephrine Stopped (08/26/22 1732)   feeding supplement (VITAL AF 1.2 CAL) Stopped (08/28/22 0957)   norepinephrine (LEVOPHED) Adult infusion 22 mcg/min (08/28/22 1000)   piperacillin-tazobactam (ZOSYN)  IV     prismasol BGK 4/2.5     propofol (DIPRIVAN) infusion Stopped (08/28/22 0702)   vancomycin     vasopressin 0.04 Units/min (08/28/22 1000)    arformoterol  15 mcg Nebulization Q12H   aspirin  81 mg Per Tube Daily   budesonide  0.5 mg Nebulization BID   Chlorhexidine Gluconate Cloth  6 each Topical Daily   cholecalciferol  1,000 Units Per Tube Daily   docusate  100 mg Per Tube BID   escitalopram  10 mg Per Tube Daily   famotidine  20 mg Per Tube QODAY   fluconazole  400 mg Per Tube Daily   free water  30 mL Per Tube Q4H   heparin injection (subcutaneous)  5,000 Units Subcutaneous  Q12H   hydrocortisone sod succinate (SOLU-CORTEF) inj  100 mg Intravenous Q12H   insulin aspart  0-20 Units Subcutaneous Q4H   insulin aspart  3 Units Subcutaneous Q4H   montelukast  10 mg Per Tube QHS   multivitamin  1 tablet Per Tube QHS   nutrition supplement (JUVEN)  1 packet Per Tube BID BM   mouth rinse  15 mL Mouth Rinse Q2H   polyethylene glycol  17 g Per Tube Daily   sevelamer carbonate  2.4 g Per Tube TID WC   acetaminophen, albuterol, anticoagulant sodium citrate, bisacodyl, fentaNYL (SUBLIMAZE) injection, fentaNYL (SUBLIMAZE) injection, heparin, heparin, lidocaine (PF), lidocaine-prilocaine, naLOXone (NARCAN)  injection, ondansetron **OR** ondansetron (ZOFRAN) IV, mouth rinse, pentafluoroprop-tetrafluoroeth, sodium chloride  Assessment/ Plan:  [name] is a 52 y.o.  female with a past medical history of end-stage renal disease, patient is on Tuesday Thursday Saturday schedule, diabetes mellitus, history of pancreatitis who came to the ER with chief complaint of abdominal pain with foul smelling urine for 2 weeks.  [facility] T[facility] Left AVF 127.5kg   End stage renal disease on hemodialysis. CRRT initiated on  08/26/22. Tolerating CRRT with low UF. Potassium 3.6, will change dialysate to 4K.   2. Anemia of chronic kidney disease Lab Results  Component Value Date   HGB 13.7 08/28/2022    Hgb stable  3. Secondary Hyperparathyroidism: with outpatient labs: PTH 2361, phosphorus 9.0, calcium 8.4 on 08/06/22.   Lab Results  Component Value Date   PTH 134 (H) 04/21/2017   CALCIUM 8.7 (L) 08/28/2022   CAION 0.81 (LL) 10/26/2020   PHOS 3.4 08/28/2022    Continue cholecalciferol and sevelamer with meals  4. Diabetes mellitus type II with chronic kidney disease/renal manifestations: noninsulin dependent. Most recent hemoglobin A1c is 7.8 on 08/18/22.  Primary team to manage sliding scale insulin  5. Hyperkalemia, Corrected   LOS: 10 Karigan Cloninger 7/3/202410:44 AM

## 2022-08-28 NOTE — Progress Notes (Signed)
   08/28/22 1600  Spiritual Encounters  Type of Visit Follow up  Care provided to: Family  Referral source Chaplain assessment  Reason for visit Urgent spiritual support  OnCall Visit No  Spiritual Framework  Presenting Themes Courage hope and growth  Interventions  Spiritual Care Interventions Made Encouragement;Explored values/beliefs/practices/strengths   Chaplain met with pts husband and brother in the ICU waiting room. Chaplain sat and spoke with them while they were visiting. Chaplain offered compassionate presence with the family and encouraging words. Brother stated that he is waiting for a miracle. Chaplain told the family that she will follow up with them tomorrow and that chaplain services is available for spiritual and emotional support.

## 2022-08-28 NOTE — Progress Notes (Signed)
PHARMACY CONSULT NOTE  Pharmacy Consult for Electrolyte Monitoring and Replacement   Recent Labs: Potassium (mmol/L)  Date Value  08/28/2022 3.6  11/21/2013 4.1   Magnesium (mg/dL)  Date Value  16/11/9602 2.1   Calcium (mg/dL)  Date Value  54/10/8117 8.7 (L)   Calcium, Total (mg/dL)  Date Value  14/78/2956 8.7   Albumin (g/dL)  Date Value  21/30/8657 3.0 (L)  08/28/2022 2.8 (L)   Phosphorus (mg/dL)  Date Value  84/69/6295 3.4   Sodium (mmol/L)  Date Value  08/28/2022 129 (L)  11/21/2013 135 (L)    Assessment: 52 y/o female with h/o uterine fibroid, IDA, anxiety, COPD, asthma, OSA, CHF, ESRD on HD, PVD with chronic foot wound, depression, GERD, marijuana use, pulmonary hypertension and cervical mass who is admitted with UTI and encephalopathy complicated by respiaratory failure and cardiac arrest requiring intubation 7/1 now on CRRT  Goal of Therapy:  Electrolytes WNL  Plan:  ---no electrolytes replacement warranted for today ---recheck electrolytes BID per CRRT protocol  Lowella Bandy ,PharmD Clinical Pharmacist 08/28/2022 2:10 PM

## 2022-08-29 ENCOUNTER — Inpatient Hospital Stay: Payer: 59

## 2022-08-29 DIAGNOSIS — J9620 Acute and chronic respiratory failure, unspecified whether with hypoxia or hypercapnia: Secondary | ICD-10-CM

## 2022-08-29 DIAGNOSIS — J9621 Acute and chronic respiratory failure with hypoxia: Secondary | ICD-10-CM | POA: Diagnosis not present

## 2022-08-29 DIAGNOSIS — N3000 Acute cystitis without hematuria: Secondary | ICD-10-CM | POA: Diagnosis not present

## 2022-08-29 DIAGNOSIS — G9341 Metabolic encephalopathy: Secondary | ICD-10-CM | POA: Diagnosis not present

## 2022-08-29 DIAGNOSIS — E119 Type 2 diabetes mellitus without complications: Secondary | ICD-10-CM | POA: Diagnosis not present

## 2022-08-29 LAB — GLUCOSE, CAPILLARY
Glucose-Capillary: 251 mg/dL — ABNORMAL HIGH (ref 70–99)
Glucose-Capillary: 288 mg/dL — ABNORMAL HIGH (ref 70–99)
Glucose-Capillary: 276 mg/dL — ABNORMAL HIGH (ref 70–99)
Glucose-Capillary: 273 mg/dL — ABNORMAL HIGH (ref 70–99)
Glucose-Capillary: 242 mg/dL — ABNORMAL HIGH (ref 70–99)
Glucose-Capillary: 247 mg/dL — ABNORMAL HIGH (ref 70–99)

## 2022-08-29 LAB — BLOOD GAS, ARTERIAL
Bicarbonate: 20 mmol/L (ref 20.0–28.0)
Mechanical Rate: 18
O2 Saturation: 98.4 %
PEEP: 8 cmH2O
Patient temperature: 37
pCO2 arterial: 37 mmHg (ref 32–48)

## 2022-08-29 LAB — RENAL FUNCTION PANEL
Albumin: 2.7 g/dL — ABNORMAL LOW (ref 3.5–5.0)
Albumin: 2.8 g/dL — ABNORMAL LOW (ref 3.5–5.0)
Anion gap: 13 (ref 5–15)
Anion gap: 12 (ref 5–15)
BUN: 33 mg/dL — ABNORMAL HIGH (ref 6–20)
BUN: 39 mg/dL — ABNORMAL HIGH (ref 6–20)
CO2: 18 mmol/L — ABNORMAL LOW (ref 22–32)
CO2: 19 mmol/L — ABNORMAL LOW (ref 22–32)
Calcium: 8.3 mg/dL — ABNORMAL LOW (ref 8.9–10.3)
Calcium: 8.3 mg/dL — ABNORMAL LOW (ref 8.9–10.3)
Chloride: 96 mmol/L — ABNORMAL LOW (ref 98–111)
Chloride: 98 mmol/L (ref 98–111)
Creatinine, Ser: 2.17 mg/dL — ABNORMAL HIGH (ref 0.44–1.00)
Creatinine, Ser: 1.93 mg/dL — ABNORMAL HIGH (ref 0.44–1.00)
GFR, Estimated: 27 mL/min — ABNORMAL LOW (ref >60)
GFR, Estimated: 31 mL/min — ABNORMAL LOW (ref >60)
Glucose, Bld: 298 mg/dL — ABNORMAL HIGH (ref 70–99)
Glucose, Bld: 312 mg/dL — ABNORMAL HIGH (ref 70–99)
Phosphorus: 2.6 mg/dL (ref 2.5–4.6)
Phosphorus: 2.1 mg/dL — ABNORMAL LOW (ref 2.5–4.6)
Potassium: 4 mmol/L (ref 3.5–5.1)
Potassium: 4.1 mmol/L (ref 3.5–5.1)
Sodium: 127 mmol/L — ABNORMAL LOW (ref 135–145)
Sodium: 129 mmol/L — ABNORMAL LOW (ref 135–145)

## 2022-08-29 LAB — CULTURE, BLOOD (ROUTINE X 2)
Culture: NO GROWTH
Special Requests: ADEQUATE

## 2022-08-29 LAB — CBC
HCT: 40.9 % (ref 36.0–46.0)
Hemoglobin: 13.7 g/dL (ref 12.0–15.0)
MCH: 29.5 pg (ref 26.0–34.0)
MCHC: 33.5 g/dL (ref 30.0–36.0)
MCV: 88.1 fL (ref 80.0–100.0)
Platelets: 117 10*3/uL — ABNORMAL LOW (ref 150–400)
RBC: 4.64 MIL/uL (ref 3.87–5.11)
RDW: 20.5 % — ABNORMAL HIGH (ref 11.5–15.5)
WBC: 29.8 10*3/uL — ABNORMAL HIGH (ref 4.0–10.5)
nRBC: 4 % — ABNORMAL HIGH (ref 0.0–0.2)

## 2022-08-29 LAB — CULTURE, RESPIRATORY W GRAM STAIN

## 2022-08-29 LAB — HEPATIC FUNCTION PANEL
ALT: 247 U/L — ABNORMAL HIGH (ref 0–44)
AST: 803 U/L — ABNORMAL HIGH (ref 15–41)
Albumin: 2.7 g/dL — ABNORMAL LOW (ref 3.5–5.0)
Alkaline Phosphatase: 722 U/L — ABNORMAL HIGH (ref 38–126)
Bilirubin, Direct: 2.6 mg/dL — ABNORMAL HIGH (ref 0.0–0.2)
Indirect Bilirubin: 1.7 mg/dL — ABNORMAL HIGH (ref 0.3–0.9)
Total Bilirubin: 4.3 mg/dL — ABNORMAL HIGH (ref 0.3–1.2)
Total Protein: 6.5 g/dL (ref 6.5–8.1)

## 2022-08-29 LAB — MAGNESIUM: Magnesium: 2.3 mg/dL (ref 1.7–2.4)

## 2022-08-29 MED ORDER — INSULIN DETEMIR 100 UNIT/ML ~~LOC~~ SOLN
10.0000 [IU] | Freq: Every day | SUBCUTANEOUS | Status: DC
  Administered 2022-08-29: 10 [IU] via SUBCUTANEOUS
  Filled 2022-08-29 (×2): qty 0.1

## 2022-08-29 MED ORDER — PANTOPRAZOLE SODIUM 40 MG IV SOLR
40.0000 mg | INTRAVENOUS | Status: DC
  Administered 2022-08-29 – 2022-09-01 (×4): 40 mg via INTRAVENOUS
  Filled 2022-08-29 (×4): qty 10

## 2022-08-29 MED ORDER — K PHOS MONO-SOD PHOS DI & MONO 155-852-130 MG PO TABS
500.0000 mg | ORAL_TABLET | ORAL | Status: AC
  Administered 2022-08-29 – 2022-08-30 (×4): 500 mg
  Filled 2022-08-29 (×4): qty 2

## 2022-08-29 MED ORDER — VASOPRESSIN 20 UNITS/100 ML INFUSION FOR SHOCK
0.0400 [IU]/min | INTRAVENOUS | Status: DC
  Administered 2022-08-29 – 2022-09-01 (×10): 0.04 [IU]/min via INTRAVENOUS
  Filled 2022-08-29 (×9): qty 100

## 2022-08-29 NOTE — TOC Progression Note (Signed)
Transition of Care [facility]) - Progression Note    Patient Details  Name: [name] MRN: [id number] Date of Birth: 1970/10/18  [facility] CM/SW Contact  Kreg Shropshire, RN Phone Number: 08/29/2022, 12:24 PM  Clinical Narrative:     CM is continuing to follow pt for toc needs.   Expected Discharge Plan and Services                                               Social Determinants of Health (SDOH) Interventions SDOH Screenings   Food Insecurity: No Food Insecurity (08/18/2022)  Housing: Patient Unable To Answer (08/18/2022)  Transportation Needs: No Transportation Needs (08/18/2022)  Utilities: Not At Risk (08/18/2022)  Depression (PHQ2-9): Low Risk  (07/15/2022)  Financial Resource Strain: High Risk (03/24/2017)  Physical Activity: Inactive (03/24/2017)  Social Connections: Somewhat Isolated (03/24/2017)  Stress: No Stress Concern Present (03/24/2017)  Tobacco Use: Low Risk  (08/27/2022)    Readmission Risk Interventions    08/21/2022    1:40 PM  Readmission Risk Prevention Plan  Transportation Screening Complete  PCP or Specialist Appt within 3-5 Days Complete  HRI or Home Care Consult Complete  Medication Review (RN Care Manager) Complete

## 2022-08-29 NOTE — Progress Notes (Signed)
[facility]  ROUNDING NOTE   Subjective:   [name] is a 52 year old African-American female with a past medical history of end-stage renal disease, patient is on Tuesday Thursday Saturday schedule, diabetes mellitus, history of pancreatitis who came to the ER with chief complaint of abdominal pain with foul smelling urine for 2 weeks.  Patient is known to our clinic and receives outpatient dialysis treatments at [facility] on a TTS schedule,    Patient remains intubated  No sedation, eyes open to name (off and on) Vent 40% FiO2/PEEP 8 Pressors: Levo and Vaso Tube feeds 92ml/hr  CRRT in place, UF 100 ml/hr  Objective:  Vital signs in last 24 hours:  Temp:  [97 F (36.1 C)-99.5 F (37.5 C)] 97.9 F (36.6 C) (07/04 1000) Pulse Rate:  [88-107] 91 (07/04 0453) Resp:  [17-31] 28 (07/04 1000) BP: (91-126)/(19-70) 91/32 (07/04 1000) SpO2:  [95 %-100 %] 97 % (07/04 0744) Arterial Line BP: (95-142)/(52-86) 110/57 (07/04 1000) FiO2 (%):  [40 %] 40 % (07/04 0744) Weight:  [130.2 kg] 130.2 kg (07/04 0300)  Weight change: -3.629 kg Filed Weights   08/27/22 0500 08/28/22 0310 08/29/22 0300  Weight: 135 kg 133.8 kg 130.2 kg    Intake/Output: I/O last 3 completed shifts: In: 3213.6 [I.V.:961.6; NG/GT:1652; IV Piggyback:600] Out: 6306    Intake/Output this shift:  Total I/O In: 353.5 [I.V.:53.5; NG/GT:300] Out: 524   Physical Exam: General: Ill appearing  Head: Normocephalic, atraumatic.  Eyes: Anicteric  Lungs:  Vent  Heart: Regular rate and rhythm  Abdomen:  Soft, nontender, obese  Extremities:  No peripheral edema.  Neurologic: Open eyes to voice (off and on)  Skin: Chronic right foot wound- bandaged, prurigo nodules  Access: Lt AVF (no bruit/thrill)    Basic Metabolic Panel: Recent Labs  Lab 08/26/22 0351 08/26/22 1316 08/27/22 0417 08/27/22 1523 08/28/22 0347 08/28/22 1532 08/29/22 0408  NA 130*   < > 129* 129* 129* 129* 127*  K 5.0    < > 4.2 3.7 3.6 3.8 4.0  CL 89*   < > 91* 96* 94* 95* 96*  CO2 16*   < > 18* 18* 20* 20* 18*  GLUCOSE 162*   < > 238* 274* 297* 225* 298*  BUN 64*   < > 49* 44* 41* 34* 33*  CREATININE 6.50*   < > 4.40* 3.55* 3.16* 2.52* 2.17*  CALCIUM 8.4*   < > 8.5* 8.0* 8.7* 8.6* 8.3*  MG 2.7*  --  2.2  --  2.1  --  2.3  PHOS  --    < > 5.0* 3.6 3.4 2.8 2.6   < > = values in this interval not displayed.    Liver Function Tests: Recent Labs  Lab 08/24/22 1849 08/25/22 0517 08/26/22 0351 08/26/22 1316 08/26/22 1721 08/27/22 0417 08/27/22 1523 08/28/22 0347 08/28/22 1532 08/29/22 0408  AST 173*  --  263* 329*  --   --   --  653*  --  803*  ALT 57*  --  65* 73*  --   --   --  162*  --  247*  ALKPHOS 597*  --  570* 547*  --   --   --  602*  --  722*  BILITOT 2.0*  --  2.5* 2.5*  --   --   --  4.3*  --  4.3*  PROT 7.1  --  6.5 6.5  --   --   --  6.5  --  6.5  ALBUMIN 3.4*   < > 3.0* 2.9*   < > 2.9* 2.8* 2.8*  3.0* 2.8* 2.7*  2.7*   < > = values in this interval not displayed.   No results for input(s): "LIPASE", "AMYLASE" in the last 168 hours. No results for input(s): "AMMONIA" in the last 168 hours.  CBC: Recent Labs  Lab 08/26/22 0351 08/26/22 1316 08/27/22 0417 08/28/22 0347 08/29/22 0408  WBC 23.0* 24.7* 20.4* 34.5* 29.8*  NEUTROABS  --  21.6* 16.5* 30.3*  --   HGB 13.9 14.1 13.8 13.7 13.7  HCT 42.3 42.1 41.0 41.6 40.9  MCV 90.6 88.3 88.0 89.5 88.1  PLT 267 284 259 171 117*    Cardiac Enzymes: No results for input(s): "CKTOTAL", "CKMB", "CKMBINDEX", "TROPONINI" in the last 168 hours.  BNP: Invalid input(s): "POCBNP"  CBG: Recent Labs  Lab 08/28/22 1524 08/28/22 1923 08/28/22 2314 08/29/22 0315 08/29/22 0804  GLUCAP 240* 173* 186* 251* 288*    Microbiology: Results for orders placed or performed during the hospital encounter of 08/18/22  Urine Culture     Status: Abnormal   Collection Time: 08/18/22  3:07 PM   Specimen: Urine, Clean Catch  Result Value  Ref Range Status   Specimen Description   Final    URINE, CLEAN CATCH Performed at [facility], [address], [place], [place] [address]    Special Requests   Final    NONE Performed at [facility], [address] [place], [place] [address]    Culture >=100,000 COLONIES/mL YEAST (A)  Final   Report Status 08/20/2022 FINAL  Final  Wet prep, genital     Status: Abnormal   Collection Time: 08/20/22  6:35 PM  Result Value Ref Range Status   Yeast Wet Prep HPF POC PRESENT (A) NONE SEEN Corrected    Comment: CORRECTED RESULTS CALLED TO: JEFF STRENK 08/22/22 1412 SH CORRECTED ON 06/27 AT 1406: PREVIOUSLY REPORTED AS NONE SEEN    Trich, Wet Prep NONE SEEN NONE SEEN Final   Clue Cells Wet Prep HPF POC NONE SEEN NONE SEEN Final   WBC, Wet Prep HPF POC >=10 (A) <10 Final   Sperm NONE SEEN  Final    Comment: Performed at [facility], [address] [place], [place] [address]  Chlamydia/NGC rt PCR [facility] only)     Status: None   Collection Time: 08/20/22  6:35 PM   Specimen: Cervical/Vaginal swab; Genital  Result Value Ref Range Status   Specimen source GC/Chlam ENDOCERVICAL  Final   Chlamydia Tr NOT DETECTED NOT DETECTED Final   N gonorrhoeae NOT DETECTED NOT DETECTED Final    Comment: (NOTE) This CT/NG assay has not been evaluated in patients with a history of  hysterectomy. Performed at [facility], [address] [place] [address] [place], [place] [address]   Culture, blood (Routine X 2) w Reflex to ID Panel     Status: None (Preliminary result)   Collection Time: 08/25/22 12:59 PM   Specimen: BLOOD  Result Value Ref Range Status   Specimen Description BLOOD BLOOD RIGHT ARM RAC  Final   Special Requests   Final    BOTTLES DRAWN AEROBIC AND ANAEROBIC Blood Culture adequate volume   Culture   Final    NO GROWTH 4 DAYS Performed at [facility], [address], [place], [place] [address]    Report Status PENDING  Incomplete  MRSA Next Gen by  PCR, Nasal     Status: None  Collection Time: 08/25/22  2:01 PM   Specimen: Nasal Mucosa; Nasal Swab  Result Value Ref Range Status   MRSA by PCR Next Gen NOT DETECTED NOT DETECTED Final    Comment: (NOTE) The GeneXpert MRSA Assay (FDA approved for NASAL specimens only), is one component of a comprehensive MRSA colonization surveillance program. It is not intended to diagnose MRSA infection nor to guide or monitor treatment for MRSA infections. Test performance is not FDA approved in patients less than 60 years old. Performed at [facility], [address] [place], [place] [address]   Culture, blood (Routine X 2) w Reflex to ID Panel     Status: None (Preliminary result)   Collection Time: 08/25/22  3:59 PM   Specimen: BLOOD  Result Value Ref Range Status   Specimen Description BLOOD PICC LINE  Final   Special Requests   Final    BOTTLES DRAWN AEROBIC AND ANAEROBIC Blood Culture adequate volume   Culture   Final    NO GROWTH 4 DAYS Performed at [facility], [address] [place], [place] [address]    Report Status PENDING  Incomplete  Culture, Respiratory w Gram Stain     Status: None   Collection Time: 08/26/22  1:28 PM   Specimen: Tracheal Aspirate; Respiratory  Result Value Ref Range Status   Specimen Description   Final    TRACHEAL ASPIRATE Performed at [facility], [address] [place], [place] [address]    Special Requests   Final    NONE Performed at [facility], [address] [place], [place] [address]    Gram Stain   Final    ABUNDANT WBC PRESENT, PREDOMINANTLY PMN RARE BUDDING YEAST SEEN RARE GRAM POSITIVE COCCI IN PAIRS    Culture   Final    FEW Consistent with normal respiratory flora. Performed at [facility], [address], [place], [place] [address]    Report Status 08/28/2022 FINAL  Final  MRSA Next Gen by PCR, Nasal     Status: None   Collection Time: 08/26/22  5:21 PM   Specimen: Nasal  Mucosa; Nasal Swab  Result Value Ref Range Status   MRSA by PCR Next Gen NOT DETECTED NOT DETECTED Final    Comment: (NOTE) The GeneXpert MRSA Assay (FDA approved for NASAL specimens only), is one component of a comprehensive MRSA colonization surveillance program. It is not intended to diagnose MRSA infection nor to guide or monitor treatment for MRSA infections. Test performance is not FDA approved in patients less than 75 years old. Performed at [facility], [address] [place], [place] [address]   Culture, Respiratory w Gram Stain     Status: None (Preliminary result)   Collection Time: 08/27/22  7:30 PM   Specimen: Tracheal Aspirate; Respiratory  Result Value Ref Range Status   Specimen Description   Final    TRACHEAL ASPIRATE Performed at [facility], [address] [place], [place] [address]    Special Requests   Final    NONE Performed at [facility], [address] [place], [place] [address]    Gram Stain   Final    ABUNDANT WBC PRESENT, PREDOMINANTLY PMN RARE GRAM POSITIVE COCCI IN PAIRS    Culture   Final    TOO YOUNG TO READ Performed at [facility], [address] [place], [place] [address]    Report Status PENDING  Incomplete    Coagulation Studies:  No results for input(s): "LABPROT", "INR" in the last 72 hours.  Urinalysis: No results for input(s): "COLORURINE", "LABSPEC", "PHURINE", "GLUCOSEU", "HGBUR", "BILIRUBINUR", "KETONESUR", "PROTEINUR", "UROBILINOGEN", "NITRITE", "LEUKOCYTESUR" in the last 72 hours.  Invalid input(s): "APPERANCEUR"     Imaging: DG Chest Port 1 View  Result Date: 08/29/2022 CLINICAL DATA:  Acute respiratory failure and hypoxia. EXAM: PORTABLE CHEST 1 VIEW COMPARISON:  08/26/2022 FINDINGS: The ET tube tip is 4.3 cm above the carina. enteric tube tip is in the stomach. Stable cardiomediastinal contours. Diffuse pulmonary vascular congestion. Scarring noted within the right midlung.  IMPRESSION: Diffuse pulmonary vascular congestion. Electronically Signed   By: Signa Kell M.D.   On: 08/29/2022 06:38   US Abdomen Limited RUQ (LIVER/GB)  Result Date: 08/28/2022 CLINICAL DATA:  Elevated LFTs EXAM: ULTRASOUND ABDOMEN LIMITED RIGHT UPPER QUADRANT COMPARISON:  CT abdomen and pelvis without contrast August 18, 2022; abdominal ultrasound August 18, 2022 FINDINGS: Gallbladder: The gallbladder appears contracted, with mild wall thickening measuring up to 4 mm. No gallstones or pericholecystic fluid. No sonographic Murphy sign noted by sonographer. Common bile duct: Diameter: 1 mm Liver: No focal lesion identified. Within normal limits in parenchymal echogenicity. Portal vein is patent on color Doppler imaging with normal direction of blood flow towards the liver. Other: None. IMPRESSION: Contracted gallbladder.  No evidence of acute cholecystitis. Electronically Signed   By: Jacob Moores M.D.   On: 08/28/2022 16:53   EEG adult  Result Date: 08/28/2022 Charlsie Quest, MD     08/28/2022  2:52 PM Patient Name: KALIOPE STAIR MRN: [id number] Epilepsy Attending: Charlsie Quest Referring Physician/Provider: Raechel Chute, MD Date: 08/28/2022 Duration: 40.59 mins Patient history: 52yo F with ams getting eeg to evaluate for seizure Level of alertness:  lethargic AEDs during EEG study: Propofol Technical aspects: This EEG study was done with scalp electrodes positioned according to the 10-20 International system of electrode placement. Electrical activity was reviewed with band pass filter of 1-70Hz , sensitivity of 7 uV/mm, display speed of 83mm/sec with a 60Hz  notched filter applied as appropriate. EEG data were recorded continuously and digitally stored.  Video monitoring was available and reviewed as appropriate. Description: EEG showed continuous generalized 3 to 6 Hz theta-delta slowing. Generalized periodic discharges with triphasic morphology at 1-1.5 Hz were also noted. Hyperventilation and  photic stimulation were not performed.   ABNORMALITY - Periodic discharges with triphasic morphology, generalized ( GPDs) - Continuous slow, generalized IMPRESSION: This study is suggestive of moderate to severe diffuse encephalopathy, nonspecific etiology but could be related to toxic-metabolic causes. No seizures or definite epileptiform discharges were seen throughout the recording. Priyanka Annabelle Harman   MR ANGIO NECK WO CONTRAST  Result Date: 08/27/2022 CLINICAL DATA:  Initial evaluation for neuro deficit, stroke. EXAM: MRA NECK WITHOUT CONTRAST TECHNIQUE: Angiographic images of the neck were acquired using MRA technique without intravenous contrast. Carotid stenosis measurements (when applicable) are obtained utilizing NASCET criteria, using the distal internal carotid diameter as the denominator. COMPARISON:  Comparison made with brain MRI performed at the same time. FINDINGS: Aortic arch: Examination technically limited by lack of IV contrast and motion. Partially visualized aortic arch within normal limits for caliber with standard branch pattern. No stenosis seen about the origin of the great vessels. Right carotid system: Right common and internal carotid arteries are patent with antegrade flow. No evidence for dissection. No visible hemodynamically significant stenosis about the right carotid artery system. Left carotid system: Left common and internal carotid arteries are patent with antegrade flow.  No evidence for dissection. No visible hemodynamically significant stenosis about the left carotid artery system. Vertebral arteries: Both vertebral arteries arise from the subclavian arteries. Right vertebral artery strongly dominant and is patent without evidence for dissection or stenosis. Left vertebral artery is markedly hypoplastic and not well seen, although appears to be grossly patent with antegrade flow on time-of-flight source sequence. No visible stenosis or dissection. Other: None. IMPRESSION: 1.  Negative MRA for large vessel occlusion or other emergent finding. 2. Wide patency of both carotid artery systems within the neck. 3. Widely patent dominant right vertebral artery within the neck. Left vertebral artery hypoplastic and not well seen on this noncontrast exam, but appears grossly patent with antegrade flow. Electronically Signed   By: Rise Mu M.D.   On: 08/27/2022 23:24   MR BRAIN WO CONTRAST  Result Date: 08/27/2022 CLINICAL DATA:  Initial evaluation for neuro deficit, stroke suspected. EXAM: MRI HEAD WITHOUT CONTRAST TECHNIQUE: Multiplanar, multiecho pulse sequences of the brain and surrounding structures were obtained without intravenous contrast. COMPARISON:  Prior CT from earlier the same day. FINDINGS: Brain: Cerebral volume within normal limits. No significant cerebral white matter disease for age. Few scattered subcentimeter foci of restricted diffusion are seen involving the bilateral cerebral hemispheres, overall slightly worse on the right (series 9, images 40, 36, 33, 32, 27, 24). Few additional subcentimeter acute small foci in noted involving the cerebellum (series 9, images 16, 14, 12). Findings consistent with small acute ischemic infarcts. Minimal associated petechial blood products at the right occipital lobe (series 17, image 26). No other associated hemorrhage. No other significant acute or chronic intracranial blood products. No mass lesion, midline shift or mass effect. No hydrocephalus or extra-axial fluid collection. Pituitary gland and suprasellar region within normal limits. Vascular: Major intracranial vascular flow voids are maintained. Skull and upper cervical spine: Of craniocervical junction within normal limits. Markedly decreased T1 signal intensity seen throughout the visualized bone marrow, nonspecific, but most commonly related to anemia, smoking or obesity. No focal marrow replacing lesion. Sinuses/Orbits: Patient status post ocular lens replacement  on the left. Paranasal sinuses are largely clear. Large bilateral mastoid effusions noted. Patient is intubated. Other: None. IMPRESSION: 1. Few scattered subcentimeter acute ischemic infarcts involving the bilateral cerebral and cerebellar hemispheres as above. Minimal associated petechial blood products at the right occipital lobe. No other associated hemorrhage or mass effect. Given the various vascular distributions involved, a central thromboembolic etiology is suspected. 2. Otherwise normal brain MRI for age. Electronically Signed   By: Rise Mu M.D.   On: 08/27/2022 23:19   CT HEAD WO CONTRAST ( )  Result Date: 08/27/2022 CLINICAL DATA:  Provided history: Mental status change, unknown cause. Additional history provided: Cardiac arrest. EXAM: CT HEAD WITHOUT CONTRAST TECHNIQUE: Contiguous axial images were obtained from the base of the skull through the vertex without intravenous contrast. RADIATION DOSE REDUCTION: This exam was performed according to the departmental dose-optimization program which includes automated exposure control, adjustment of the mA and/or kV according to patient size and/or use of iterative reconstruction technique. COMPARISON:  No pertinent prior exams available for comparison. FINDINGS: Brain: No age advanced or lobar predominant parenchymal atrophy. Partially empty sella turcica. No acute infarct/loss of gray-white differentiation is identified. There is no acute intracranial hemorrhage No extra-axial fluid collection. No evidence of an intracranial mass. No midline shift. Vascular: No hyperdense vessel.  Atherosclerotic calcifications. Skull: No calvarial fracture or aggressive osseous lesion. Sinuses/Orbits: No mass or acute finding within the imaged orbits. No  significant paranasal sinus disease at the imaged levels. Other: Large right middle ear/mastoid effusion. Small-volume fluid within the left mastoid air cells. IMPRESSION: 1. No CT evidence of an acute  intracranial abnormality. Please note, a brain MRI would have greater sensitivity for acute hypoxic/ischemic injury. 2. Partially empty sella turcica. This finding can reflect incidental anatomic variation, or alternatively, it can be associated with idiopathic intracranial hypertension (pseudotumor cerebri). 3. Large right middle ear/mastoid effusion. 4. Small-volume fluid within the left mastoid air cells. Electronically Signed   By: Jackey Loge D.O.   On: 08/27/2022 17:05   DG Abd 1 View  Result Date: 08/27/2022 CLINICAL DATA:  Orogastric tube placement EXAM: ABDOMEN - 1 VIEW COMPARISON:  03/03/2017 FINDINGS: The orogastric tube tip is in the stomach antrum with side port in the stomach body. Possible esophageal temperature probe adjacent to the OG tube in the lower thoracic esophagus. Scattered vascular calcifications noted. IMPRESSION: 1. Orogastric tube tip in the stomach antrum with side port in the stomach body. Electronically Signed   By: Gaylyn Rong M.D.   On: 08/27/2022 14:36     Medications:     prismasol BGK 4/2.5 300 mL/hr at 08/28/22 2047    prismasol BGK 4/2.5 300 mL/hr at 08/28/22 2104   anticoagulant sodium citrate     epinephrine Stopped (08/26/22 1732)   feeding supplement (VITAL AF 1.2 CAL) 60 mL/hr at 08/29/22 1000   norepinephrine (LEVOPHED) Adult infusion 7 mcg/min (08/29/22 1000)   piperacillin-tazobactam (ZOSYN)  IV Stopped (08/29/22 0533)   prismasol BGK 4/2.5 1,500 mL/hr at 08/29/22 0756   propofol (DIPRIVAN) infusion Stopped (08/28/22 1610)   vancomycin Stopped (08/28/22 1620)   vasopressin 0.04 Units/min (08/29/22 1000)    arformoterol  15 mcg Nebulization Q12H   aspirin  81 mg Per Tube Daily   budesonide  0.5 mg Nebulization BID   Chlorhexidine Gluconate Cloth  6 each Topical Daily   cholecalciferol  1,000 Units Per Tube Daily   escitalopram  10 mg Per Tube Daily   famotidine  20 mg Per Tube QODAY   fluconazole  400 mg Per Tube Daily   free water  30  mL Per Tube Q4H   heparin injection (subcutaneous)  5,000 Units Subcutaneous Q12H   hydrocortisone sod succinate (SOLU-CORTEF) inj  100 mg Intravenous Q12H   insulin aspart  0-20 Units Subcutaneous Q4H   insulin aspart  3 Units Subcutaneous Q4H   montelukast  10 mg Per Tube QHS   multivitamin  1 tablet Per Tube QHS   nutrition supplement (JUVEN)  1 packet Per Tube BID BM   mouth rinse  15 mL Mouth Rinse Q2H   sevelamer carbonate  2.4 g Per Tube TID WC   acetaminophen, albuterol, anticoagulant sodium citrate, bisacodyl, docusate, fentaNYL (SUBLIMAZE) injection, fentaNYL (SUBLIMAZE) injection, heparin, heparin, lidocaine (PF), lidocaine-prilocaine, naLOXone (NARCAN)  injection, ondansetron **OR** ondansetron (ZOFRAN) IV, mouth rinse, pentafluoroprop-tetrafluoroeth, polyethylene glycol, polyvinyl alcohol, sodium chloride  Assessment/ Plan:  [name] is a 52 y.o.  female with a past medical history of end-stage renal disease, patient is on Tuesday Thursday Saturday schedule, diabetes mellitus, history of pancreatitis who came to the ER with chief complaint of abdominal pain with foul smelling urine for 2 weeks.  [facility] TTS Left AVF 127.5kg   End stage renal disease on hemodialysis. CRRT initiated on 08/26/22. Tolerating CRRT with low UF. Continue 4 K bath. UF rate 100 cc/hr. Increase dialysate rate to 2000 cc/hr  2. Anemia assessment  Lab Results  Component Value Date   HGB 13.7 08/29/2022    Hgb stable  3. Secondary Hyperparathyroidism: with outpatient labs: PTH 2361, phosphorus 9.0, calcium 8.4 on 08/06/22.   Lab Results  Component Value Date   PTH 134 (H) 04/21/2017   CALCIUM 8.3 (L) 08/29/2022   CAION 0.81 (LL) 10/26/2020   PHOS 2.6 08/29/2022    Continue cholecalciferol and sevelamer with meals  4. Diabetes mellitus type II with chronic kidney disease/renal manifestations: noninsulin dependent. Most recent hemoglobin A1c is 7.8 on 08/18/22.  Primary team to  manage sliding scale insulin  5.  Acute respiratory failure Ventilator assisted.  6.  Hypotension Requiring pressors-vasopressin and Levophed.  7.  Altered mental status Off sedation now MRI of the brain shows few scattered subcentimeter's acute ischemic infarcts involving bilateral cerebral and cerebellar hemispheres.  Central thromboembolic etiology is suspected.    LOS: 11 Rosemae Mcquown 7/4/202410:23 AM

## 2022-08-29 NOTE — Progress Notes (Signed)
Pt transported to CT on the vent and returned to ICU 9 without incident. Pt remains on the vent and is tol well at this time.

## 2022-08-29 NOTE — IPAL (Signed)
  Interdisciplinary Goals of Care Family Meeting   Date carried out: 08/29/2022  Location of the meeting: Conference room  Member's involved: Physician, Bedside Registered Nurse, and Family Member or next of kin  Durable Power of Attorney or acting medical decision maker: Brother, Sister, 4 adult children, multiple cousins and other family members.    Discussion: We discussed goals of care for [name] Discussed updates in her condition. Explained that her neurological condition has overall remained unchanged. Explored patient's and family's wishes in the event of no meaningful neurological recovery. Agreed that patient would require more time on dialysis before appropriate prognostication especially given renal failure complicating the condition.  Code status:   Code Status: Full Code   Disposition: Continue current acute care  Time spent for the meeting: 20 minutes.    Raechel Chute, MD  08/29/2022, 6:58 PM

## 2022-08-29 NOTE — Progress Notes (Signed)
PHARMACY CONSULT NOTE  Pharmacy Consult for Electrolyte Monitoring and Replacement   Recent Labs: Potassium (mmol/L)  Date Value  08/29/2022 4.1  11/21/2013 4.1   Magnesium (mg/dL)  Date Value  16/11/9602 2.3   Calcium (mg/dL)  Date Value  54/10/8117 8.3 (L)   Calcium, Total (mg/dL)  Date Value  14/78/2956 8.7   Albumin (g/dL)  Date Value  21/30/8657 2.8 (L)   Phosphorus (mg/dL)  Date Value  84/69/6295 2.1 (L)   Sodium (mmol/L)  Date Value  08/29/2022 129 (L)  11/21/2013 135 (L)    Assessment: 52 y/o female with h/o uterine fibroid, IDA, anxiety, COPD, asthma, OSA, CHF, ESRD on HD, PVD with chronic foot wound, depression, GERD, marijuana use, pulmonary hypertension and cervical mass who is admitted with UTI and encephalopathy complicated by respiaratory failure and cardiac arrest requiring intubation 7/1 now on CRRT  Goal of Therapy:  Electrolytes WNL  Plan:  ---Phos 2.1: Will replete with K-phos neutral 500 mg Q4H x 4 per tube ---recheck electrolytes BID per CRRT protocol  Sharen Hones, PharmD, BCPS Clinical Pharmacist   08/29/2022 5:48 PM

## 2022-08-29 NOTE — Progress Notes (Signed)
NAME:  [name], MRN:  [id number], DOB:  1970-08-23, LOS: 11 ADMISSION DATE:  08/18/2022, CHIEF COMPLAINT:  Cardiac Arrest   History of Present Illness:  Patient is a 52 year old female with a past medical history of ESRD (on HD), COPD/Asthma, OSA/OHS, chronic hypoxic respiratory failure, HTN, HFpEF, T2DM, and morbid obesity who presented to the hospital with dysuria, now in the ICU with altered mental status complicated by cardiac arrest.   Patient initially presented to the hospital on 08/18/2022 with dysuria and admitted for the management of UTI. Cultures have grown yeast, and she's received IV antibiotics as well as anti-fungal therapy. During the course of her hospitalization she was evaluated by gynecology and underwent a cervical biopsy. [name] mental status was waxing and waning in the setting of ESRD, acidemia, and hypercapnia. She did not tolerate BiPAP nocturnally. Given worsening, patient was moved to the ICU for monitoring as well as for consideration of CRRT. She had a central line placed by vascular surgery at the bedside yesterday.    Overnight, patient was noted to be unresponsive with the development of bradycardia followed by PEA arrest. She was resuscitated after 15 minutes of CPR and this morning was noted to be on pressors.   Significant Hospital Events: Including procedures, antibiotic start and stop dates in addition to other pertinent events   08/25/2022: admitted to the ICU 08/26/2022: right CVC noted in arterial position, vascular surgery discontinued. Dialysis catheter placed. CRRT initiated. 08/27/2022: continued on CRRT with volume removal. CT head and MRI brain performed, no sign of hypoxic brain injury. 08/28/2022: continued on CRRT, pressor requirements down. EEG with diffuse slowing. Opens eyes but does not follow commands. Family meeting held, updated on progress  Interim History / Subjective:  Remains unresponsive. Opens eyes to verbal stimuli otherwise does  not follow commands.  Objective   Blood pressure (!) 94/32, pulse 91, temperature 99 F (37.2 C), resp. rate (!) 26, height 5' 4.5" (1.638 m), weight 130.2 kg, last menstrual period 10/14/2012, SpO2 97 %.    Vent Mode: PRVC FiO2 (%):  [40 %] 40 % Set Rate:  [18 bmp] 18 bmp Vt Set:  [500 mL] 500 mL PEEP:  [8 cmH20] 8 cmH20 Plateau Pressure:  [27 cmH20] 27 cmH20   Intake/Output Summary (Last 24 hours) at 08/29/2022 0803 Last data filed at 08/29/2022 0700 Gross per 24 hour  Intake 1864.24 ml  Output 4145 ml  Net -2280.76 ml   Filed Weights   08/27/22 0500 08/28/22 0310 08/29/22 0300  Weight: 135 kg 133.8 kg 130.2 kg    Examination: Physical Exam Constitutional:      General: She is not in acute distress.    Appearance: She is obese. She is ill-appearing.  HENT:     Head: Normocephalic.  Cardiovascular:     Rate and Rhythm: Normal rate and regular rhythm.     Pulses: Normal pulses.     Heart sounds: Normal heart sounds.  Pulmonary:     Comments: Ventilated breath sounds anteriorly on auscultation Abdominal:     General: There is distension.     Palpations: Abdomen is soft.  Musculoskeletal:     Right lower leg: Edema present.     Left lower leg: Edema present.     Comments: Right foot diabetic ulcer  Neurological:     Mental Status: She is disoriented.     Comments: Pupils equal and very sluggish to reach. Opens eyes but does not follow commands.  Assessment & Plan:   Neurology #Toxic Metabolic Encephalopathy  This is in the setting of cardiac arrest, metabolic acidosis, toxic metabolite accumulation secondary to ESRD, as well as CO2 narcosis. Patient was intubated in the setting of her cardiac arrest, and was initiated on propofol for sedation with PRN's in place for analgesia. Propofol has been discontinued since Monday. CRRT initiated for clearance of toxic metabolites, patient ventilated with improvement in her hypercapnia. CT head and MRI brain performed  yesterday, no findings to suggest hypoxic injury but MRI does show scattered ischemic infarcts but this does not explain her encephalopathy. EEG shows diffuse slowing.  Patient will likely need more time prior to making definitive prognostic assessment.  -Maintain off sedation -Maintain normothermia following cardiac arrest -monitor for CVA given CVC in the carotid -PRN ABG's -d/c cefepime, switch to zosyn   Cardiovascular #Cardiac Arrest #Circulatory Shock #HFpEF #Group 2 Pulmonary Hypertension #Inadvertent CVC placement in the carotid  Cardiac arrest was likely secondary to CO2 narcosis and metabolic acidosis, preceded by bradycardia. Blood gas had shown acidosis peri-arrest. Rise in troponin likely secondary to chest compressions and ACLS, with post arrest EKG not suggestive of ischemia nor arrhythmia. Repeat echocardiogram showed dilated right ventricle, elevated PASP, and moderate to severe mitral stenosis similar to prior. She is on CRRT with overall negative balance with ultra-filtration.   Developed circulatory shock post cardiac arrest, and is currently on nor-epinephrine (tapering down) and vasopressin is now discontinued. Will continue to the cover the patient broadly for possible septic shock as a cause of distributive shock and continue with volume removal via CRRT.   Furthermore, patient had a central line placed in the carotid artery that was removed with vascular surgery.   -continue vasopressor support with goal MAP 65 -trend troponin and lactic acid -added stress dose steroids -check lipid panel   Pulmonary #Acute on Chronic Hypoxic Respiratory Failure #OSA/OHS #Asthma/COPD overlap   Intubated in the setting of hypoxic arrest; known history of severe OHS/OSA and asthma/COPD. Currently ventilated with blood gases showing metabolic acidosis. Will follow blood gas and adjust ventilator settings accordingly. Did have increased secretions from the ETT which were  cultured.   -Full vent support, implement lung protective strategies -Plateau pressures less than 30 cm H20 -Wean FiO2 & PEEP as tolerated to maintain O2 sats >92% -Follow intermittent Chest X-ray & ABG as needed -Spontaneous Breathing Trials when respiratory parameters met and mental status permits -Implement VAP Bundle -Continue Bronchodilators   Gastrointestinal #Elevated liver enzymes   LFT's are chronically elevated. RUQ ultrasound on admission did not showing acute findings. Likely NAFLD. Patient also on PPI for SUP and tube feeds were initiated. Is on fluconazole which could contribute to LFT elevation, will discontinue. Will also switch SUP from H2B to PPI given rise in LFT's.   -repeat RUQ U/S without signs of cholecystitis -check lipid panel -switch SUP to PPI -avoid hepatotoxins   Renal #ESRD on HD #Anion Gap Metabolic Acidosis  History of ESRD on HD. Given hemodynamic instability, pressor use, and cardiac arrest, will did proceed with CRRT for management of acidosis and volume status. Patient will benefit from volume removal given elevated PASP and signs of RV pressure overload. Appreciate input from nephrology.   Endocrine #T2DM   ICU glycemic protocol. Check lipid panel. Continue with stress dose steroids.   Hem/Onc heparin subQ for DVT prophylaxis, monitor H/H and platelet counts   ID #UTI #Aspiration Pneumonia #Sepsis   Patient treated for UTI with Ceftriaxone, which we did broaden,  now on Zosyn and Vancomycin.   -restart vancomycin -d/c metronidazole -d/c doxycycline -switched Cefepime (received one dose) to Zosyn -discontinuation fluconazole with rising LFT's  Best Practice (right click and "Reselect all SmartList Selections" daily)   Diet/type: tubefeeds DVT prophylaxis: prophylactic heparin  GI prophylaxis: PPI Lines: Central line, Dialysis Catheter, Arterial Line, and yes and it is still needed Foley:  Yes, and it is still needed Code Status:   full code Last date of multidisciplinary goals of care discussion [08/29/2022]  Labs   CBC: Recent Labs  Lab 08/26/22 0351 08/26/22 1316 08/27/22 0417 08/28/22 0347 08/29/22 0408  WBC 23.0* 24.7* 20.4* 34.5* 29.8*  NEUTROABS  --  21.6* 16.5* 30.3*  --   HGB 13.9 14.1 13.8 13.7 13.7  HCT 42.3 42.1 41.0 41.6 40.9  MCV 90.6 88.3 88.0 89.5 88.1  PLT 267 284 259 171 117*    Basic Metabolic Panel: Recent Labs  Lab 08/26/22 0351 08/26/22 1316 08/27/22 0417 08/27/22 1523 08/28/22 0347 08/28/22 1532 08/29/22 0408  NA 130*   < > 129* 129* 129* 129* 127*  K 5.0   < > 4.2 3.7 3.6 3.8 4.0  CL 89*   < > 91* 96* 94* 95* 96*  CO2 16*   < > 18* 18* 20* 20* 18*  GLUCOSE 162*   < > 238* 274* 297* 225* 298*  BUN 64*   < > 49* 44* 41* 34* 33*  CREATININE 6.50*   < > 4.40* 3.55* 3.16* 2.52* 2.17*  CALCIUM 8.4*   < > 8.5* 8.0* 8.7* 8.6* 8.3*  MG 2.7*  --  2.2  --  2.1  --  2.3  PHOS  --    < > 5.0* 3.6 3.4 2.8 2.6   < > = values in this interval not displayed.   GFR: Estimated Creatinine Clearance: 41.4 mL/min (A) (by C-G formula based on SCr of 2.17 mg/dL (H)). Recent Labs  Lab 08/26/22 0351 08/26/22 0708 08/26/22 1225 08/26/22 1316 08/27/22 0417 08/27/22 0812 08/28/22 0347 08/29/22 0408  PROCALCITON  --   --   --  3.71  --   --   --   --   WBC 23.0*  --   --  24.7* 20.4*  --  34.5* 29.8*  LATICACIDVEN 6.9* 6.2* 5.2*  --   --  2.5*  --   --     Liver Function Tests: Recent Labs  Lab 08/24/22 1849 08/25/22 0517 08/26/22 0351 08/26/22 1316 08/26/22 1721 08/27/22 0417 08/27/22 1523 08/28/22 0347 08/28/22 1532 08/29/22 0408  AST 173*  --  263* 329*  --   --   --  653*  --  803*  ALT 57*  --  65* 73*  --   --   --  162*  --  247*  ALKPHOS 597*  --  570* 547*  --   --   --  602*  --  722*  BILITOT 2.0*  --  2.5* 2.5*  --   --   --  4.3*  --  4.3*  PROT 7.1  --  6.5 6.5  --   --   --  6.5  --  6.5  ALBUMIN 3.4*   < > 3.0* 2.9*   < > 2.9* 2.8* 2.8*  3.0* 2.8* 2.7*   2.7*   < > = values in this interval not displayed.   No results for input(s): "LIPASE", "AMYLASE" in the last 168 hours. No results for input(s): "AMMONIA" in  the last 168 hours.  ABG    Component Value Date/Time   PHART 7.34 (L) 08/28/2022 0842   PCO2ART 37 08/28/2022 0842   PO2ART 90 08/28/2022 0842   HCO3 20.0 08/28/2022 0842   TCO2 23 10/26/2020 1619   ACIDBASEDEF 5.2 (H) 08/28/2022 0842   O2SAT 98.4 08/28/2022 0842     Coagulation Profile: No results for input(s): "INR", "PROTIME" in the last 168 hours.  Cardiac Enzymes: No results for input(s): "CKTOTAL", "CKMB", "CKMBINDEX", "TROPONINI" in the last 168 hours.  HbA1C: Hgb A1c MFr Bld  Date/Time Value Ref Range Status  08/18/2022 12:11 PM 7.8 (H) 4.8 - 5.6 % Final    Comment:    (NOTE) Pre diabetes:          5.7%-6.4%  Diabetes:              >6.4%  Glycemic control for   <7.0% adults with diabetes   04/16/2022 02:15 AM 7.3 (H) 4.8 - 5.6 % Final    Comment:    (NOTE) Pre diabetes:          5.7%-6.4%  Diabetes:              >6.4%  Glycemic control for   <7.0% adults with diabetes     CBG: Recent Labs  Lab 08/28/22 1107 08/28/22 1524 08/28/22 1923 08/28/22 2314 08/29/22 0315  GLUCAP 300* 240* 173* 186* 251*    Review of Systems:   Unable to obtain  Past Medical History:  She,  has a past medical history of (HFpEF) heart failure with preserved ejection fraction (HCC), Anxiety, Asthma, Bronchitis, CHF (congestive heart failure) (HCC), Chronic cough, Chronic respiratory failure with hypoxia and hypercapnia (HCC), CKD (chronic kidney disease), stage III (HCC), Depression, Diabetes mellitus without complication (HCC), Diverticulosis, Dyspnea, Dysrhythmia, Environmental and seasonal allergies, Extreme obesity with alveolar hypoventilation (HCC), Hypertension, Iron deficiency anemia, Kidney failure, Orthopnea, Oxygen dependent, Pericardial effusion, Pneumonia, Poorly controlled diabetes mellitus (HCC), and  Swelling of both lower extremities.   Surgical History:   Past Surgical History:  Procedure Laterality Date   A/V FISTULAGRAM Left 06/09/2017   Procedure: A/V FISTULAGRAM;  Surgeon: Annice Needy, MD;  Location: [facility];  Service: Cardiovascular;  Laterality: Left;   A/V FISTULAGRAM Left 06/16/2017   Procedure: A/V FISTULAGRAM;  Surgeon: Annice Needy, MD;  Location: [facility];  Service: Cardiovascular;  Laterality: Left;   A/V FISTULAGRAM Left 01/28/2018   Procedure: A/V FISTULAGRAM;  Surgeon: Annice Needy, MD;  Location: [facility];  Service: Cardiovascular;  Laterality: Left;   AMPUTATION TOE Right 10/21/2020   Procedure: AMPUTATION TOE-5th Digit Amputation Right Foot;  Surgeon: Felecia Shelling, DPM;  Location: [facility];  Service: Podiatry;  Laterality: Right;   AV FISTULA PLACEMENT Left 04/25/2017   Procedure: ARTERIOVENOUS (AV) FISTULA CREATION;  Surgeon: Annice Needy, MD;  Location: [facility];  Service: Vascular;  Laterality: Left;   BONE BIOPSY Right 04/27/2021   Procedure: Right 4th metatarsal BONE BIOPSY;  Surgeon: Candelaria Stagers, DPM;  Location: [facility];  Service: Podiatry;  Laterality: Right;   CATARACT EXTRACTION W/PHACO Left 01/21/2018   Procedure: CATARACT EXTRACTION PHACO AND INTRAOCULAR LENS PLACEMENT (IOC);  Surgeon: Lockie Mola, MD;  Location: [facility];  Service: Ophthalmology;  Laterality: Left;  [place] 01:55.5 CDE 9.05 EAUP 13.9 Fluid Pack Lot # [id number]   CENTRAL LINE INSERTION N/A 08/26/2022   Procedure: CENTRAL LINE INSERTION;  Surgeon: Renford Dills, MD;  Location: [facility]  INVASIVE CV LAB;  Service: Cardiovascular;  Laterality: N/A;   CERVICAL CONIZATION W/BX N/A 08/21/2022   Procedure: CERVICAL BIOPSY;  Surgeon: Feliberto Gottron, Ihor Austin, MD;  Location: [facility];  Service: Gynecology;  Laterality: N/A;   CESAREAN SECTION     times 3   DIALYSIS/PERMA CATHETER INSERTION Right 04/04/2017   Procedure: DIALYSIS/PERMA CATHETER INSERTION;   Surgeon: Renford Dills, MD;  Location: [facility];  Service: Cardiovascular;  Laterality: Right;   DIALYSIS/PERMA CATHETER REMOVAL N/A 09/17/2017   Procedure: DIALYSIS/PERMA CATHETER REMOVAL;  Surgeon: Annice Needy, MD;  Location: [facility];  Service: Cardiovascular;  Laterality: N/A;   INCISION AND DRAINAGE Right 10/26/2020   Procedure: INCISION AND DRAINAGE;  Surgeon: Candelaria Stagers, DPM;  Location: [facility];  Service: Podiatry;  Laterality: Right;   INSERTION OF DIALYSIS CATHETER N/A 04/16/2017   Procedure: INSERTION OF DIALYSIS PERM CATHETER;  Surgeon: Annice Needy, MD;  Location: [facility];  Service: Vascular;  Laterality: N/A;   LOWER EXTREMITY ANGIOGRAPHY Right 10/18/2020   Procedure: Lower Extremity Angiography;  Surgeon: Annice Needy, MD;  Location: [facility];  Service: Cardiovascular;  Laterality: Right;   LOWER EXTREMITY ANGIOGRAPHY Right 04/17/2022   Procedure: Lower Extremity Angiography;  Surgeon: Annice Needy, MD;  Location: [facility];  Service: Cardiovascular;  Laterality: Right;     Social History:   reports that she has never smoked. She has been exposed to tobacco smoke. She has never used smokeless tobacco. She reports that she does not currently use alcohol. She reports current drug use. Drug: Marijuana.   Family History:  Her family history includes Breast cancer (age of onset: 37) in her maternal aunt and maternal aunt; Diabetes in her father, mother, and another family member; Hypertension in her father, mother, and another family member.   Allergies Allergies  Allergen Reactions   Dust Mite Extract Shortness Of Breath and Itching   Mold Extract [Trichophyton] Shortness Of Breath and Itching   Pollen Extract Shortness Of Breath    Itching, shortness of breath, asthma like symptoms   Ozempic (0.25 Or 0.5 Mg-Dose) [Semaglutide(0.25 Or 0.5mg -Dos)] Other (See Comments)    pancreatitis   Sulfa Antibiotics Itching   Feraheme  [Ferumoxytol] Itching    Uses benadryl so she can get the medicine     Home Medications  Prior to Admission medications   Medication Sig Start Date End Date Taking? Authorizing Provider  acetaminophen (TYLENOL) 500 MG tablet Take 1,000 mg by mouth every 6 (six) hours as needed for mild pain, fever or headache.   Yes [provider]  albuterol (PROVENTIL) (2.5 MG/3ML) 0.083% nebulizer solution Take 3 mLs (2.5 mg total) by nebulization every 6 (six) hours as needed for wheezing or shortness of breath. 02/15/22  Yes Salena Saner, MD  albuterol (VENTOLIN HFA) 108 (90 Base) MCG/ACT inhaler Inhale 2 puffs into the lungs every 6 (six) hours as needed for wheezing or shortness of breath. 02/15/22  Yes Salena Saner, MD  atorvastatin (LIPITOR) 40 MG tablet Take 1 tablet (40 mg total) by mouth daily. 04/20/22  Yes Regalado, Belkys A, MD  benzonatate (TESSALON PERLES) 100 MG capsule Take 1 capsule (100 mg total) by mouth 3 (three) times daily as needed for cough. 05/22/22 05/22/23 Yes Boswell, Chelsa, NP  budesonide (PULMICORT) 0.5 MG/2ML nebulizer solution USE 1 VIAL  IN  NEBULIZER TWICE  DAILY - (Rinse Mouth After Each Treatment) 08/13/22  Yes Orson Eva, NP  ciprofloxacin (CIPRO) 500 MG tablet Take 500 mg by mouth daily. 08/13/22  Yes [provider]  clindamycin (CLEOCIN T) 1 % lotion APPLY TO FACE, ARMS AND LEGS EVENLY NIGHTLY AT BEDTIME 06/27/22  Yes Boswell, Chelsa, NP  escitalopram (LEXAPRO) 10 MG tablet Take 1 tablet (10 mg total) by mouth daily. 04/24/22 04/24/23 Yes Boswell, Gareth Morgan, NP  formoterol (PERFOROMIST) 20 MCG/2ML nebulizer solution USE 1 VIAL  IN  NEBULIZER TWICE  DAILY - (Morning And Evening) 08/13/22  Yes Boswell, Chelsa, NP  gentamicin cream (GARAMYCIN) 0.1 % Apply 1 Application topically at bedtime. 06/27/22  Yes [provider]  insulin degludec (TRESIBA) 100 UNIT/ML FlexTouch Pen Inject 20 Units into the skin daily. 04/20/22  Yes Regalado, Belkys A, MD   lidocaine-prilocaine (EMLA) cream Apply topically as needed (For access for dialysis).   Yes [provider]  LINZESS 290 MCG CAPS capsule TAKE 1 CAPSULE BY MOUTH IN THE MORNING WITH A GLASS OF WATER 30 MINUTES BEFORE BREAKFAST 08/08/22  Yes Miki Kins, FNP  methocarbamol (ROBAXIN) 750 MG tablet Take 750 mg by mouth every 8 (eight) hours as needed for muscle spasms.   Yes [provider]  midodrine (PROAMATINE) 10 MG tablet Take 20 mg by mouth daily as needed (for low blood pressure on dialysis days). 08/23/21  Yes [provider]  ondansetron (ZOFRAN) 4 MG tablet Take 4 mg by mouth 2 (two) times daily as needed for nausea. 07/02/22  Yes [provider]  pregabalin (LYRICA) 50 MG capsule TAKE 1 CAPSULE BY MOUTH EVERY 8 HOURS AS NEEDED FOR  NEUROPATHY 07/01/22  Yes Orson Eva, NP  revefenacin (YUPELRI) 175 MCG/3ML nebulizer solution  01/19/21  Yes [provider]  sevelamer (RENAGEL) 800 MG tablet Take 800-2,400 mg by mouth 4 (four) times daily. Three tablets three times daily with meals and 1 tablet with snacks once daily   Yes [provider]  torsemide (DEMADEX) 100 MG tablet Take 100 mg by mouth daily. 05/21/22  Yes [provider]  aspirin EC 81 MG tablet Take 81 mg by mouth daily.  Patient not taking: Reported on 08/19/2022    [provider]  cloNIDine (CATAPRES) 0.1 MG tablet Take 0.1 mg by mouth 2 (two) times daily. Patient not taking: Reported on 08/19/2022 03/21/22   [provider]  doxepin (SINEQUAN) 10 MG capsule Take 1 capsule (10 mg total) by mouth at bedtime. Patient not taking: Reported on 08/19/2022 04/24/22   Orson Eva, NP  ferric citrate (AURYXIA) 1 GM 210 MG(Fe) tablet Take 210 mg by mouth 3 (three) times daily with meals. Only taking once a day most days Patient not taking: Reported on 08/19/2022    [provider]  insulin lispro (HUMALOG) 100 UNIT/ML KwikPen Inject into the  skin. Patient not taking: Reported on 08/19/2022 06/12/21   [provider]  lactulose (CHRONULAC) 10 GM/15ML solution Take 30 mLs (20 g total) by mouth daily as needed for severe constipation. Patient not taking: Reported on 08/19/2022 04/26/17   Katha Hamming, MD  montelukast (SINGULAIR) 10 MG tablet Take 1 tablet (10 mg total) by mouth at bedtime. Patient not taking: Reported on 08/19/2022 09/17/21   Salena Saner, MD  Omeprazole Magnesium (PRILOSEC PO)     [provider]  OXYGEN Inhale 2-4 L into the lungs.    [provider]  predniSONE (DELTASONE) 50 MG tablet Take 1 tablet by mouth daily in AM with food x 5 days Patient not taking: Reported  on 08/19/2022 04/24/22   Orson Eva, NP  RENVELA 2.4 g PACK Take 2.4 g by mouth 3 (three) times daily. Patient not taking: Reported on 08/19/2022 08/07/21   [provider]  sucroferric oxyhydroxide (VELPHORO) 500 MG chewable tablet Chew 500 mg by mouth 3 (three) times daily with meals.    [provider]  Vitamin D, Cholecalciferol, 25 MCG (1000 UT) TABS Take 1,000 Units by mouth daily. Patient not taking: Reported on 08/19/2022    [provider]     Critical care time: 39 minutes    Raechel Chute, MD [facility] 08/29/2022 10:41 AM

## 2022-08-29 NOTE — Progress Notes (Signed)
PHARMACY CONSULT NOTE  Pharmacy Consult for Electrolyte Monitoring and Replacement   Recent Labs: Potassium (mmol/L)  Date Value  08/29/2022 4.0  11/21/2013 4.1   Magnesium (mg/dL)  Date Value  08/65/7846 2.3   Calcium (mg/dL)  Date Value  96/29/5284 8.3 (L)   Calcium, Total (mg/dL)  Date Value  13/24/4010 8.7   Albumin (g/dL)  Date Value  27/25/3664 2.7 (L)  08/29/2022 2.7 (L)   Phosphorus (mg/dL)  Date Value  40/34/7425 2.6   Sodium (mmol/L)  Date Value  08/29/2022 127 (L)  11/21/2013 135 (L)    Assessment: 52 y/o female with h/o uterine fibroid, IDA, anxiety, COPD, asthma, OSA, CHF, ESRD on HD, PVD with chronic foot wound, depression, GERD, marijuana use, pulmonary hypertension and cervical mass who is admitted with UTI and encephalopathy complicated by respiaratory failure and cardiac arrest requiring intubation 7/1 now on CRRT  Goal of Therapy:  Electrolytes WNL  Plan:  ---no electrolytes replacement warranted for today ---recheck electrolytes BID per CRRT protocol  Bettey Costa ,PharmD Clinical Pharmacist 08/29/2022 7:30 AM

## 2022-08-29 NOTE — Progress Notes (Signed)
Patient in NSR, blood pressures stable with pressors, on ventilator. CRRT running, able to meet goal, patient tolerating well. Patient with short run of Kirby Funk, MD made aware. Flexi placed.  Family updates provided this morning and evening.   Multiple visitors this afternoon, visitation policy explained. Daughter created password for patient. Per family meeting this evening, updates will be provided to family at daily family meetings.   Continued to monitor.

## 2022-08-30 DIAGNOSIS — I5032 Chronic diastolic (congestive) heart failure: Secondary | ICD-10-CM | POA: Diagnosis not present

## 2022-08-30 DIAGNOSIS — G9341 Metabolic encephalopathy: Secondary | ICD-10-CM | POA: Diagnosis not present

## 2022-08-30 DIAGNOSIS — J9621 Acute and chronic respiratory failure with hypoxia: Secondary | ICD-10-CM | POA: Diagnosis not present

## 2022-08-30 DIAGNOSIS — I4891 Unspecified atrial fibrillation: Secondary | ICD-10-CM

## 2022-08-30 LAB — RENAL FUNCTION PANEL
Albumin: 2.5 g/dL — ABNORMAL LOW (ref 3.5–5.0)
Albumin: 2.5 g/dL — ABNORMAL LOW (ref 3.5–5.0)
Anion gap: 10 (ref 5–15)
Anion gap: 12 (ref 5–15)
BUN: 33 mg/dL — ABNORMAL HIGH (ref 6–20)
BUN: 32 mg/dL — ABNORMAL HIGH (ref 6–20)
CO2: 21 mmol/L — ABNORMAL LOW (ref 22–32)
CO2: 19 mmol/L — ABNORMAL LOW (ref 22–32)
Calcium: 8.3 mg/dL — ABNORMAL LOW (ref 8.9–10.3)
Calcium: 8.4 mg/dL — ABNORMAL LOW (ref 8.9–10.3)
Chloride: 101 mmol/L (ref 98–111)
Chloride: 101 mmol/L (ref 98–111)
Creatinine, Ser: 1.68 mg/dL — ABNORMAL HIGH (ref 0.44–1.00)
Creatinine, Ser: 1.45 mg/dL — ABNORMAL HIGH (ref 0.44–1.00)
GFR, Estimated: 37 mL/min — ABNORMAL LOW (ref >60)
GFR, Estimated: 44 mL/min — ABNORMAL LOW (ref >60)
Glucose, Bld: 313 mg/dL — ABNORMAL HIGH (ref 70–99)
Glucose, Bld: 238 mg/dL — ABNORMAL HIGH (ref 70–99)
Phosphorus: 2.8 mg/dL (ref 2.5–4.6)
Phosphorus: 2.8 mg/dL (ref 2.5–4.6)
Potassium: 3.8 mmol/L (ref 3.5–5.1)
Potassium: 4.2 mmol/L (ref 3.5–5.1)
Sodium: 132 mmol/L — ABNORMAL LOW (ref 135–145)
Sodium: 132 mmol/L — ABNORMAL LOW (ref 135–145)

## 2022-08-30 LAB — VANCOMYCIN, RANDOM: Vancomycin Rm: 15 ug/mL

## 2022-08-30 LAB — LIPID PANEL
Cholesterol: 157 mg/dL (ref 0–200)
HDL: <10 mg/dL — ABNORMAL LOW (ref >40)
Triglycerides: 153 mg/dL — ABNORMAL HIGH (ref <150)
VLDL: 31 mg/dL (ref 0–40)

## 2022-08-30 LAB — CBC
HCT: 40.1 % (ref 36.0–46.0)
Hemoglobin: 13.3 g/dL (ref 12.0–15.0)
MCH: 29.6 pg (ref 26.0–34.0)
MCHC: 33.2 g/dL (ref 30.0–36.0)
MCV: 89.1 fL (ref 80.0–100.0)
Platelets: 104 10*3/uL — ABNORMAL LOW (ref 150–400)
RBC: 4.5 MIL/uL (ref 3.87–5.11)
RDW: 21.2 % — ABNORMAL HIGH (ref 11.5–15.5)
WBC: 29.9 10*3/uL — ABNORMAL HIGH (ref 4.0–10.5)
nRBC: 3.1 % — ABNORMAL HIGH (ref 0.0–0.2)

## 2022-08-30 LAB — CULTURE, BLOOD (ROUTINE X 2): Culture: NO GROWTH

## 2022-08-30 LAB — GLUCOSE, CAPILLARY
Glucose-Capillary: 193 mg/dL — ABNORMAL HIGH (ref 70–99)
Glucose-Capillary: 215 mg/dL — ABNORMAL HIGH (ref 70–99)
Glucose-Capillary: 230 mg/dL — ABNORMAL HIGH (ref 70–99)
Glucose-Capillary: 238 mg/dL — ABNORMAL HIGH (ref 70–99)
Glucose-Capillary: 247 mg/dL — ABNORMAL HIGH (ref 70–99)
Glucose-Capillary: 262 mg/dL — ABNORMAL HIGH (ref 70–99)

## 2022-08-30 LAB — HEPATIC FUNCTION PANEL
ALT: 205 U/L — ABNORMAL HIGH (ref 0–44)
AST: 388 U/L — ABNORMAL HIGH (ref 15–41)
Albumin: 2.5 g/dL — ABNORMAL LOW (ref 3.5–5.0)
Alkaline Phosphatase: 689 U/L — ABNORMAL HIGH (ref 38–126)
Bilirubin, Direct: 1.9 mg/dL — ABNORMAL HIGH (ref 0.0–0.2)
Indirect Bilirubin: 1.8 mg/dL — ABNORMAL HIGH (ref 0.3–0.9)
Total Bilirubin: 3.7 mg/dL — ABNORMAL HIGH (ref 0.3–1.2)
Total Protein: 6.2 g/dL — ABNORMAL LOW (ref 6.5–8.1)

## 2022-08-30 LAB — HEMOGLOBIN A1C
Hgb A1c MFr Bld: 7.5 % — ABNORMAL HIGH (ref 4.8–5.6)
Mean Plasma Glucose: 168.55 mg/dL

## 2022-08-30 LAB — MAGNESIUM: Magnesium: 2.5 mg/dL — ABNORMAL HIGH (ref 1.7–2.4)

## 2022-08-30 LAB — TRIGLYCERIDES: Triglycerides: 153 mg/dL — ABNORMAL HIGH (ref <150)

## 2022-08-30 MED ORDER — INSULIN DETEMIR 100 UNIT/ML ~~LOC~~ SOLN
10.0000 [IU] | Freq: Two times a day (BID) | SUBCUTANEOUS | Status: DC
  Administered 2022-08-30 – 2022-09-01 (×4): 10 [IU] via SUBCUTANEOUS
  Filled 2022-08-30 (×5): qty 0.1

## 2022-08-30 MED ORDER — AMIODARONE IV BOLUS ONLY 150 MG/100ML
150.0000 mg | Freq: Once | INTRAVENOUS | Status: AC
  Filled 2022-08-30: qty 100

## 2022-08-30 MED ORDER — INSULIN ASPART 100 UNIT/ML IJ SOLN
7.0000 [IU] | INTRAMUSCULAR | Status: DC
  Administered 2022-08-30 – 2022-09-01 (×12): 7 [IU] via SUBCUTANEOUS
  Filled 2022-08-30 (×11): qty 1

## 2022-08-30 MED ORDER — HYDROCORTISONE SOD SUC (PF) 100 MG IJ SOLR
50.0000 mg | Freq: Two times a day (BID) | INTRAMUSCULAR | Status: DC
  Administered 2022-08-30 – 2022-09-01 (×5): 50 mg via INTRAVENOUS
  Filled 2022-08-30 (×5): qty 2

## 2022-08-30 MED ORDER — SODIUM CHLORIDE 0.9 % IV BOLUS
250.0000 mL | Freq: Once | INTRAVENOUS | Status: AC
  Administered 2022-08-30: 250 mL via INTRAVENOUS

## 2022-08-30 MED ORDER — AMIODARONE HCL IN DEXTROSE 360-4.14 MG/200ML-% IV SOLN
60.0000 mg/h | INTRAVENOUS | Status: AC
  Administered 2022-08-30: 60 mg/h via INTRAVENOUS
  Filled 2022-08-30: qty 200

## 2022-08-30 MED ORDER — PHENYLEPHRINE CONCENTRATED 100MG/250ML (0.4 MG/ML) INFUSION SIMPLE
0.0000 ug/min | INTRAVENOUS | Status: DC
  Administered 2022-08-30: 190 ug/min via INTRAVENOUS
  Administered 2022-08-30: 20 ug/min via INTRAVENOUS
  Administered 2022-08-31 (×2): 220 ug/min via INTRAVENOUS
  Filled 2022-08-30 (×4): qty 250

## 2022-08-30 MED ORDER — AMIODARONE IV BOLUS ONLY 150 MG/100ML
INTRAVENOUS | Status: AC
  Administered 2022-08-30: 150 mg via INTRAVENOUS
  Filled 2022-08-30: qty 100

## 2022-08-30 MED ORDER — AMIODARONE HCL IN DEXTROSE 360-4.14 MG/200ML-% IV SOLN
30.0000 mg/h | INTRAVENOUS | Status: DC
  Administered 2022-08-30 – 2022-09-01 (×4): 30 mg/h via INTRAVENOUS
  Filled 2022-08-30 (×4): qty 200

## 2022-08-30 MED ORDER — INSULIN ASPART 100 UNIT/ML IJ SOLN
5.0000 [IU] | INTRAMUSCULAR | Status: DC
  Administered 2022-08-30 (×3): 5 [IU] via SUBCUTANEOUS
  Filled 2022-08-30 (×3): qty 1

## 2022-08-30 MED ORDER — AMIODARONE LOAD VIA INFUSION
150.0000 mg | Freq: Once | INTRAVENOUS | Status: AC
  Administered 2022-08-30: 150 mg via INTRAVENOUS
  Filled 2022-08-30: qty 83.34

## 2022-08-30 NOTE — Anesthesia Postprocedure Evaluation (Signed)
Anesthesia Post Note  Patient: [name]  Procedure(s) Performed: CERVICAL BIOPSY (Vagina )  Patient location during evaluation: PACU Anesthesia Type: General Level of consciousness: awake and alert Pain management: pain level controlled Vital Signs Assessment: post-procedure vital signs reviewed and stable Respiratory status: spontaneous breathing, nonlabored ventilation, respiratory function stable and patient connected to nasal cannula oxygen Cardiovascular status: blood pressure returned to baseline and stable Postop Assessment: no apparent nausea or vomiting Anesthetic complications: no   There were no known notable events for this encounter.   Last Vitals:  Vitals:   08/30/22 0940 08/30/22 1116  BP:    Pulse:    Resp: (!) 21   Temp: 37 C   SpO2:  92%    Last Pain:  Vitals:   08/30/22 0400  TempSrc: Esophageal  PainSc:                  Lenard Simmer

## 2022-08-30 NOTE — Progress Notes (Signed)
[facility]  ROUNDING NOTE   Subjective:   [name] is a 52 year old African-American female with a past medical history of end-stage renal disease, patient is on Tuesday Thursday Saturday schedule, diabetes mellitus, history of pancreatitis who came to the ER with chief complaint of abdominal pain with foul smelling urine for 2 weeks.  Patient is known to our clinic and receives outpatient dialysis treatments at [facility] on a TTS schedule,    Patient remains intubated  No sedation, eyes open to name (off and on) Vent 40% FiO2/PEEP 8 Pressors: Levo and Vaso, amiodarone given Tube feeds 71ml/hr  CRRT in place, UF 100 ml/hr, reduced to 50 mL/h due to tachycardia and hypotensive  Objective:  Vital signs in last 24 hours:  Temp:  [92.7 F (33.7 C)-100 F (37.8 C)] 98.6 F (37 C) (07/05 0940) Pulse Rate:  [90-109] 95 (07/05 0515) Resp:  [19-30] 21 (07/05 0940) BP: (73-107)/(28-61) 73/48 (07/05 0900) SpO2:  [92 %-100 %] 92 % (07/05 1116) Arterial Line BP: (82-129)/(51-79) 102/62 (07/05 0940) FiO2 (%):  [40 %] 40 % (07/05 1116) Weight:  [127.9 kg] 127.9 kg (07/05 0300)  Weight change: -2.268 kg Filed Weights   08/28/22 0310 08/29/22 0300 08/30/22 0300  Weight: 133.8 kg 130.2 kg 127.9 kg    Intake/Output: I/O last 3 completed shifts: In: 3776.9 [I.V.:717; Other:145; ZD/GL:8756; IV Piggyback:599.9] Out: 6656 [Emesis/NG output:30; Stool:50]   Intake/Output this shift:  Total I/O In: 775.8 [I.V.:345.6; NG/GT:180; IV Piggyback:250.2] Out: 534 [Stool:100]  Physical Exam: General: Ill appearing  Head: Normocephalic, atraumatic.  Eyes: Anicteric  Lungs:  Vent  Heart: Regular rate and rhythm  Abdomen:  Soft, nontender, obese  Extremities:  No peripheral edema.  Neurologic: Open eyes to voice (off and on)  Skin: Chronic right foot wound- bandaged, prurigo nodules  Access: Lt AVF (no bruit/thrill)    Basic Metabolic Panel: Recent Labs  Lab  09/23/2022 0351 09/04/2022 1316 08/27/22 0417 08/27/22 1523 08/28/22 0347 08/28/22 1532 08/29/22 0408 08/29/22 1645 08/30/22 0408  NA 130*   < > 129*   < > 129* 129* 127* 129* 132*  K 5.0   < > 4.2   < > 3.6 3.8 4.0 4.1 3.8  CL 89*   < > 91*   < > 94* 95* 96* 98 101  CO2 16*   < > 18*   < > 20* 20* 18* 19* 21*  GLUCOSE 162*   < > 238*   < > 297* 225* 298* 312* 313*  BUN 64*   < > 49*   < > 41* 34* 33* 39* 33*  CREATININE 6.50*   < > 4.40*   < > 3.16* 2.52* 2.17* 1.93* 1.68*  CALCIUM 8.4*   < > 8.5*   < > 8.7* 8.6* 8.3* 8.3* 8.3*  MG 2.7*  --  2.2  --  2.1  --  2.3  --  2.5*  PHOS  --    < > 5.0*   < > 3.4 2.8 2.6 2.1* 2.8   < > = values in this interval not displayed.     Liver Function Tests: Recent Labs  Lab 08/31/2022 0351 08/28/2022 1316 09/06/2022 1721 08/28/22 0347 08/28/22 1532 08/29/22 0408 08/29/22 1645 08/30/22 0407 08/30/22 0408  AST 263* 329*  --  653*  --  803*  --  388*  --   ALT 65* 73*  --  162*  --  247*  --  205*  --  ALKPHOS 570* 547*  --  602*  --  722*  --  689*  --   BILITOT 2.5* 2.5*  --  4.3*  --  4.3*  --  3.7*  --   PROT 6.5 6.5  --  6.5  --  6.5  --  6.2*  --   ALBUMIN 3.0* 2.9*   < > 2.8*  3.0* 2.8* 2.7*  2.7* 2.8* 2.5* 2.5*   < > = values in this interval not displayed.    No results for input(s): "LIPASE", "AMYLASE" in the last 168 hours. No results for input(s): "AMMONIA" in the last 168 hours.  CBC: Recent Labs  Lab 08/31/2022 1316 08/27/22 0417 08/28/22 0347 08/29/22 0408 08/30/22 0408  WBC 24.7* 20.4* 34.5* 29.8* 29.9*  NEUTROABS 21.6* 16.5* 30.3*  --   --   HGB 14.1 13.8 13.7 13.7 13.3  HCT 42.1 41.0 41.6 40.9 40.1  MCV 88.3 88.0 89.5 88.1 89.1  PLT 284 259 171 117* 104*     Cardiac Enzymes: No results for input(s): "CKTOTAL", "CKMB", "CKMBINDEX", "TROPONINI" in the last 168 hours.  BNP: Invalid input(s): "POCBNP"  CBG: Recent Labs  Lab 08/29/22 1912 08/29/22 2321 08/30/22 0317 08/30/22 0728 08/30/22 1140  GLUCAP  242* 247* 262* 247* 230*     Microbiology: Results for orders placed or performed during the hospital encounter of 08/12/2022  Urine Culture     Status: Abnormal   Collection Time: 08/12/2022  3:07 PM   Specimen: Urine, Clean Catch  Result Value Ref Range Status   Specimen Description   Final    URINE, CLEAN CATCH Performed at [facility], [address] [place], [place] [address]    Special Requests   Final    NONE Performed at [facility], [address] [place], [place] [address]    Culture >=100,000 COLONIES/mL YEAST (A)  Final   Report Status 08/20/2022 FINAL  Final  Wet prep, genital     Status: Abnormal   Collection Time: 08/20/22  6:35 PM  Result Value Ref Range Status   Yeast Wet Prep HPF POC PRESENT (A) NONE SEEN Corrected    Comment: CORRECTED RESULTS CALLED TO: JEFF STRENK 08/22/22 1412 SH CORRECTED ON 06/27 AT 1406: PREVIOUSLY REPORTED AS NONE SEEN    Trich, Wet Prep NONE SEEN NONE SEEN Final   Clue Cells Wet Prep HPF POC NONE SEEN NONE SEEN Final   WBC, Wet Prep HPF POC >=10 (A) <10 Final   Sperm NONE SEEN  Final    Comment: Performed at [facility], [address] [place], [place] [address]  Chlamydia/NGC rt PCR [facility] only)     Status: None   Collection Time: 08/20/22  6:35 PM   Specimen: Cervical/Vaginal swab; Genital  Result Value Ref Range Status   Specimen source GC/Chlam ENDOCERVICAL  Final   Chlamydia Tr NOT DETECTED NOT DETECTED Final   N gonorrhoeae NOT DETECTED NOT DETECTED Final    Comment: (NOTE) This CT/NG assay has not been evaluated in patients with a history of  hysterectomy. Performed at [facility], [address] [place], [place] [address]   Culture, blood (Routine X 2) w Reflex to ID Panel     Status: None   Collection Time: 08/25/22 12:59 PM   Specimen: BLOOD  Result Value Ref Range Status   Specimen Description BLOOD BLOOD RIGHT ARM RAC  Final   Special Requests   Final    BOTTLES DRAWN  AEROBIC AND  ANAEROBIC Blood Culture adequate volume   Culture   Final    NO GROWTH 5 DAYS Performed at [facility], [address] [place], [place] [address]    Report Status 08/30/2022 FINAL  Final  MRSA Next Gen by PCR, Nasal     Status: None   Collection Time: 08/25/22  2:01 PM   Specimen: Nasal Mucosa; Nasal Swab  Result Value Ref Range Status   MRSA by PCR Next Gen NOT DETECTED NOT DETECTED Final    Comment: (NOTE) The GeneXpert MRSA Assay (FDA approved for NASAL specimens only), is one component of a comprehensive MRSA colonization surveillance program. It is not intended to diagnose MRSA infection nor to guide or monitor treatment for MRSA infections. Test performance is not FDA approved in patients less than 35 years old. Performed at [facility], [address] [place], [place] [address]   Culture, blood (Routine X 2) w Reflex to ID Panel     Status: None   Collection Time: 08/25/22  3:59 PM   Specimen: BLOOD  Result Value Ref Range Status   Specimen Description BLOOD PICC LINE  Final   Special Requests   Final    BOTTLES DRAWN AEROBIC AND ANAEROBIC Blood Culture adequate volume   Culture   Final    NO GROWTH 5 DAYS Performed at [facility], [address] [place], [place] [address]    Report Status 08/30/2022 FINAL  Final  Culture, Respiratory w Gram Stain     Status: None   Collection Time: 09/25/2022  1:28 PM   Specimen: Tracheal Aspirate; Respiratory  Result Value Ref Range Status   Specimen Description   Final    TRACHEAL ASPIRATE Performed at [facility], [address], [place], [place] [address]    Special Requests   Final    NONE Performed at [facility], [address] [place], [place] [address]    Gram Stain   Final    ABUNDANT WBC PRESENT, PREDOMINANTLY PMN RARE BUDDING YEAST SEEN RARE GRAM POSITIVE COCCI IN PAIRS    Culture   Final    FEW Consistent with normal respiratory  flora. Performed at [facility], [address] [place], [place] [address]    Report Status 08/28/2022 FINAL  Final  MRSA Next Gen by PCR, Nasal     Status: None   Collection Time: 09/09/2022  5:21 PM   Specimen: Nasal Mucosa; Nasal Swab  Result Value Ref Range Status   MRSA by PCR Next Gen NOT DETECTED NOT DETECTED Final    Comment: (NOTE) The GeneXpert MRSA Assay (FDA approved for NASAL specimens only), is one component of a comprehensive MRSA colonization surveillance program. It is not intended to diagnose MRSA infection nor to guide or monitor treatment for MRSA infections. Test performance is not FDA approved in patients less than 20 years old. Performed at [facility], [address] [place], [place] [address]   Culture, Respiratory w Gram Stain     Status: None (Preliminary result)   Collection Time: 08/27/22  7:30 PM   Specimen: Tracheal Aspirate; Respiratory  Result Value Ref Range Status   Specimen Description   Final    TRACHEAL ASPIRATE Performed at [facility], [address] [place], [place] [address]    Special Requests   Final    NONE Performed at [facility], [address] [place], [place] [address]    Gram Stain   Final  ABUNDANT WBC PRESENT, PREDOMINANTLY PMN RARE GRAM POSITIVE COCCI IN PAIRS    Culture   Final    CULTURE REINCUBATED FOR BETTER GROWTH Performed at [facility], [address] [place], [place] [address]    Report Status PENDING  Incomplete    Coagulation Studies: No results for input(s): "LABPROT", "INR" in the last 72 hours.  Urinalysis: No results for input(s): "COLORURINE", "LABSPEC", "PHURINE", "GLUCOSEU", "HGBUR", "BILIRUBINUR", "KETONESUR", "PROTEINUR", "UROBILINOGEN", "NITRITE", "LEUKOCYTESUR" in the last 72 hours.  Invalid input(s): "APPERANCEUR"     Imaging: CT HEAD WO CONTRAST ( )  Result Date: 08/29/2022 CLINICAL DATA:  Altered mental status EXAM: CT HEAD WITHOUT  CONTRAST TECHNIQUE: Contiguous axial images were obtained from the base of the skull through the vertex without intravenous contrast. RADIATION DOSE REDUCTION: This exam was performed according to the departmental dose-optimization program which includes automated exposure control, adjustment of the mA and/or kV according to patient size and/or use of iterative reconstruction technique. COMPARISON:  08/27/2022 FINDINGS: Brain: There is no mass, hemorrhage or extra-axial collection. The size and configuration of the ventricles and extra-axial CSF spaces are normal. The brain parenchyma is normal, without acute or chronic infarction. Vascular: Atherosclerotic calcification of the vertebral and internal carotid arteries at the skull base. No abnormal hyperdensity of the major intracranial arteries or dural venous sinuses. Skull: The visualized skull base, calvarium and extracranial soft tissues are normal. Sinuses/Orbits: Opacification of the right mastoid and middle ear cavity. Partial opacification of the left mastoid air cells. The orbits are normal. IMPRESSION: 1. No acute intracranial abnormality. 2. Opacification of the right mastoid and middle ear cavity. Partial opacification of the left mastoid air cells. Electronically Signed   By: Deatra Robinson M.D.   On: 08/29/2022 20:07   DG Chest Port 1 View  Result Date: 08/29/2022 CLINICAL DATA:  Acute respiratory failure and hypoxia. EXAM: PORTABLE CHEST 1 VIEW COMPARISON:  09/18/2022 FINDINGS: The ET tube tip is 4.3 cm above the carina. enteric tube tip is in the stomach. Stable cardiomediastinal contours. Diffuse pulmonary vascular congestion. Scarring noted within the right midlung. IMPRESSION: Diffuse pulmonary vascular congestion. Electronically Signed   By: Signa Kell M.D.   On: 08/29/2022 06:38   US Abdomen Limited RUQ (LIVER/GB)  Result Date: 08/28/2022 CLINICAL DATA:  Elevated LFTs EXAM: ULTRASOUND ABDOMEN LIMITED RIGHT UPPER QUADRANT COMPARISON:  CT  abdomen and pelvis without contrast August 18, 2022; abdominal ultrasound August 18, 2022 FINDINGS: Gallbladder: The gallbladder appears contracted, with mild wall thickening measuring up to 4 mm. No gallstones or pericholecystic fluid. No sonographic Murphy sign noted by sonographer. Common bile duct: Diameter: 1 mm Liver: No focal lesion identified. Within normal limits in parenchymal echogenicity. Portal vein is patent on color Doppler imaging with normal direction of blood flow towards the liver. Other: None. IMPRESSION: Contracted gallbladder.  No evidence of acute cholecystitis. Electronically Signed   By: Jacob Moores M.D.   On: 08/28/2022 16:53   EEG adult  Result Date: 08/28/2022 Charlsie Quest, MD     08/28/2022  2:52 PM Patient Name: OTTA OSIPOV MRN: [id number] Epilepsy Attending: Charlsie Quest Referring Physician/Provider: Raechel Chute, MD Date: 08/28/2022 Duration: 40.59 mins Patient history: 52yo F with ams getting eeg to evaluate for seizure Level of alertness:  lethargic AEDs during EEG study: Propofol Technical aspects: This EEG study was done with scalp electrodes positioned according to the 10-20 International system of electrode placement. Electrical activity was reviewed with band pass filter of 1-70Hz , sensitivity of  7 uV/mm, display speed of 20mm/sec with a 60Hz  notched filter applied as appropriate. EEG data were recorded continuously and digitally stored.  Video monitoring was available and reviewed as appropriate. Description: EEG showed continuous generalized 3 to 6 Hz theta-delta slowing. Generalized periodic discharges with triphasic morphology at 1-1.5 Hz were also noted. Hyperventilation and photic stimulation were not performed.   ABNORMALITY - Periodic discharges with triphasic morphology, generalized ( GPDs) - Continuous slow, generalized IMPRESSION: This study is suggestive of moderate to severe diffuse encephalopathy, nonspecific etiology but could be related to  toxic-metabolic causes. No seizures or definite epileptiform discharges were seen throughout the recording. Priyanka Annabelle Harman     Medications:     prismasol BGK 4/2.5 300 mL/hr at 08/30/22 0820    prismasol BGK 4/2.5 300 mL/hr at 08/30/22 0858   amiodarone 60 mg/hr (08/30/22 1100)   amiodarone     anticoagulant sodium citrate     feeding supplement (VITAL AF 1.2 CAL) 60 mL/hr at 08/30/22 1100   norepinephrine (LEVOPHED) Adult infusion Stopped (08/30/22 1052)   phenylephrine (NEO-SYNEPHRINE) Adult infusion 200 mcg/min (08/30/22 1100)   piperacillin-tazobactam (ZOSYN)  IV Stopped (08/30/22 0533)   prismasol BGK 4/2.5 2,000 mL/hr at 08/30/22 1119   propofol (DIPRIVAN) infusion Stopped (08/28/22 1610)   vancomycin Stopped (08/29/22 1609)   vasopressin 0.04 Units/min (08/30/22 1121)    arformoterol  15 mcg Nebulization Q12H   aspirin  81 mg Per Tube Daily   budesonide  0.5 mg Nebulization BID   Chlorhexidine Gluconate Cloth  6 each Topical Daily   cholecalciferol  1,000 Units Per Tube Daily   free water  30 mL Per Tube Q4H   heparin injection (subcutaneous)  5,000 Units Subcutaneous Q12H   hydrocortisone sod succinate (SOLU-CORTEF) inj  50 mg Intravenous Q12H   insulin aspart  0-20 Units Subcutaneous Q4H   insulin aspart  5 Units Subcutaneous Q4H   insulin detemir  10 Units Subcutaneous QHS   montelukast  10 mg Per Tube QHS   multivitamin  1 tablet Per Tube QHS   nutrition supplement (JUVEN)  1 packet Per Tube BID BM   mouth rinse  15 mL Mouth Rinse Q2H   pantoprazole (PROTONIX) IV  40 mg Intravenous Q24H   sevelamer carbonate  2.4 g Per Tube TID WC   albuterol, anticoagulant sodium citrate, bisacodyl, docusate, heparin, heparin, lidocaine (PF), lidocaine-prilocaine, naLOXone (NARCAN)  injection, mouth rinse, pentafluoroprop-tetrafluoroeth, polyethylene glycol, polyvinyl alcohol, sodium chloride  Assessment/ Plan:  [name] is a 52 y.o.  female with a past medical history  of end-stage renal disease, patient is on Tuesday Thursday Saturday schedule, diabetes mellitus, history of pancreatitis who came to the ER with chief complaint of abdominal pain with foul smelling urine for 2 weeks.  [facility] TTS Left AVF 127.5kg   End stage renal disease on hemodialysis. CRRT initiated on 09/17/2022.  CRRT remains in place, UF temporarily decreased due to tachycardia and hypotension.  Continue 4 K bath.  Maintain dialysate rate to 2000 cc/hr  2. Anemia assessment Lab Results  Component Value Date   HGB 13.3 08/30/2022    Hgb stable.  No need for ESA  3. Secondary Hyperparathyroidism: with outpatient labs: PTH 2361, phosphorus 9.0, calcium 8.4 on 08/06/22.   Lab Results  Component Value Date   PTH 134 (H) 04/21/2017   CALCIUM 8.3 (L) 08/30/2022   CAION 0.81 (LL) 10/26/2020   PHOS 2.8 08/30/2022  Calcium and phosphorus within optimal range.  Continue cholecalciferol and sevelamer with meals  4. Diabetes mellitus type II with chronic kidney disease/renal manifestations: noninsulin dependent. Most recent hemoglobin A1c is 7.8 on 08/04/2022.   Glucose elevated Primary team to manage sliding scale insulin  5.  Acute respiratory failure Ventilator assisted.  6.  Hypotension Requiring pressors-vasopressin, Levophed and neo.  7.  Altered mental status Off sedation now MRI of the brain shows few scattered subcentimeter's acute ischemic infarcts involving bilateral cerebral and cerebellar hemispheres.  Central thromboembolic etiology is suspected.    LOS: 12 Laurali Goddard 7/5/202411:58 AM

## 2022-08-30 NOTE — Inpatient Diabetes Management (Signed)
Inpatient Diabetes Program Recommendations  AACE/ADA: New Consensus Statement on Inpatient Glycemic Control (2015)  Target Ranges:  Prepandial:   less than 140 mg/dL      Peak postprandial:   less than 180 mg/dL (1-2 hours)      Critically ill patients:  140 - 180 mg/dL    Latest Reference Range & Units 08/28/22 23:14 08/29/22 03:15 08/29/22 08:04 08/29/22 11:27 08/29/22 16:34 08/29/22 19:12  Glucose-Capillary 70 - 99 mg/dL 161 (H) 096 (H) 045 (H) 276 (H) 273 (H) 242 (H)  (H): Data is abnormally high  Latest Reference Range & Units 08/29/22 23:21 08/30/22 03:17 08/30/22 07:28  Glucose-Capillary 70 - 99 mg/dL 409 (H)  10 units Novolog  262 (H)  16 units Novolog  247 (H)  12 units Novolog   (H): Data is abnormally high   Home DM Meds: Tresiba 20 units Daily    Current Orders: Novolog 0-20 units Q4 hours     Novolog 5 units Q4 hours     Levemir 10 units at bedtime    MD- Note Levemir 10 units at bedtime started last PM--Also note Novolog tube feed coverage increased to 5 units Q4H early this AM  Please consider:  1. Increase Levemir to 10 units BID (takes 20 units total basal insulin at home)   2. Increase Novolog Tube Feed Coverage to 7 units Q4 hours     --Will follow patient during hospitalization--  Ambrose Finland RN, MSN, CDCES Diabetes Coordinator Inpatient Glycemic Control Team Team Pager: [phone] (8a-5p)

## 2022-08-30 NOTE — Progress Notes (Signed)
Patient opens eyes to voice with downward gaze. Patient not making eye contact or tracking. Patient has cough, gag,and corneal reflex intact. Blood pressures stable, tolerating CRRT well, NSR.  Patient continues to have increasing ectopy. MD aware.  Patient with runs of Vtach, converted to Afibb, EKG in chart. Sustains in 150s-170s. MD made aware. New orders placed. Patient received Amio bolus and started on Amio infusion. Unable to maintain CRRT goal, MD and Nephrology made aware.  HR continues to sustain in 150s, second Amiodarone bolus given. Amiodarone infusion resumed. Medications titrated per orders.  Patient remains in Afibb, HR controlled in 120s. Pressures stable.   Patient converted back to NSR, EKG in chart.  CRRT fluid removal increased to meet net zero goal. Pressors increased to maintain pressures. MD made aware.  Cousins at bedside, requested updates. Family unable to provide patient password, This RN ask family to speak with patient daughter for updates/passwords. Family asked to stay on side of room opposite CRRT machine, patient safety education provided. Family not compliant despite repeated request.    CRRT fluid removal stopped per MD, pressures not tolerating. Will continue to monitor.   Family friend at bedside, password provided. Updates given.   Brother at bedside, does not have patient password. States he will wait to receive updates from MD at family meeting.   Updates provided by MD to family at family meeting this evening.

## 2022-08-30 NOTE — Progress Notes (Signed)
PHARMACY CONSULT NOTE  Pharmacy Consult for Electrolyte Monitoring and Replacement   Recent Labs: Potassium (mmol/L)  Date Value  08/30/2022 4.2  11/21/2013 4.1   Magnesium (mg/dL)  Date Value  13/09/6576 2.5 (H)   Calcium (mg/dL)  Date Value  46/96/2952 8.4 (L)   Calcium, Total (mg/dL)  Date Value  84/13/2440 8.7   Albumin (g/dL)  Date Value  12/22/2534 2.5 (L)   Phosphorus (mg/dL)  Date Value  64/40/3474 2.8   Sodium (mmol/L)  Date Value  08/30/2022 132 (L)  11/21/2013 135 (L)    Assessment: 52 y/o female with h/o uterine fibroid, IDA, anxiety, COPD, asthma, OSA, CHF, ESRD on HD, PVD with chronic foot wound, depression, GERD, marijuana use, pulmonary hypertension and cervical mass who is admitted with UTI and encephalopathy complicated by respiaratory failure and cardiac arrest requiring intubation 7/1 now on CRRT  Goal of Therapy:  Electrolytes within normal limits  Plan:  --No electrolyte replacement warranted at this time --Re-check electrolytes BID per CRRT protocol  Tressie Ellis 08/30/2022 4:05 PM

## 2022-08-30 NOTE — Progress Notes (Signed)
PHARMACY CONSULT NOTE  Pharmacy Consult for Electrolyte Monitoring and Replacement   Recent Labs: Potassium (mmol/L)  Date Value  08/30/2022 3.8  11/21/2013 4.1   Magnesium (mg/dL)  Date Value  16/11/9602 2.5 (H)   Calcium (mg/dL)  Date Value  54/10/8117 8.3 (L)   Calcium, Total (mg/dL)  Date Value  14/78/2956 8.7   Albumin (g/dL)  Date Value  21/30/8657 2.5 (L)   Phosphorus (mg/dL)  Date Value  84/69/6295 2.8   Sodium (mmol/L)  Date Value  08/30/2022 132 (L)  11/21/2013 135 (L)    Assessment: 52 y/o female with h/o uterine fibroid, IDA, anxiety, COPD, asthma, OSA, CHF, ESRD on HD, PVD with chronic foot wound, depression, GERD, marijuana use, pulmonary hypertension and cervical mass who is admitted with UTI and encephalopathy complicated by respiaratory failure and cardiac arrest requiring intubation 7/1 now on CRRT  Goal of Therapy:  Electrolytes WNL  Plan:  ---no electrolyte replacement warranted at this time ---recheck electrolytes BID per CRRT protocol  Lowella Bandy, PharmD, BCPS Clinical Pharmacist   08/30/2022 7:07 AM

## 2022-08-30 NOTE — Progress Notes (Signed)
NAME:  [name], MRN:  [id number], DOB:  1970-12-19, LOS: 12 ADMISSION DATE:  08/02/2022, CHIEF COMPLAINT:  Cardiac Arrest   History of Present Illness:  Patient is a 52 year old female with a past medical history of ESRD (on HD), COPD/Asthma, OSA/OHS, chronic hypoxic respiratory failure, HTN, HFpEF, T2DM, and morbid obesity who presented to the hospital with dysuria, now in the ICU with altered mental status complicated by cardiac arrest.   Patient initially presented to the hospital on 08/09/2022 with dysuria and admitted for the management of UTI. Cultures have grown yeast, and she's received IV antibiotics as well as anti-fungal therapy. During the course of her hospitalization she was evaluated by gynecology and underwent a cervical biopsy. [name] mental status was waxing and waning in the setting of ESRD, acidemia, and hypercapnia. She did not tolerate BiPAP nocturnally. Given worsening, patient was moved to the ICU for monitoring as well as for consideration of CRRT. She had a central line placed by vascular surgery at the bedside yesterday.    Overnight, patient was noted to be unresponsive with the development of bradycardia followed by PEA arrest. She was resuscitated after 15 minutes of CPR and this morning was noted to be on pressors.   Significant Hospital Events: Including procedures, antibiotic start and stop dates in addition to other pertinent events   08/25/2022: admitted to the ICU 09/03/2022: right CVC noted in arterial position, vascular surgery discontinued. Dialysis catheter placed. CRRT initiated. 08/27/2022: continued on CRRT with volume removal. CT head and MRI brain performed, no sign of hypoxic brain injury. 08/28/2022: continued on CRRT, pressor requirements down. EEG with diffuse slowing. Opens eyes but does not follow commands. Family meeting held, updated on progress 08/29/2022: continued on CRRT. Anisocoria noted in the afternoon, repeat head CT unchanged. Family  meeting held, family updated  Interim History / Subjective:  Continues to be unresponsive. Opens eyes but does not withdraw to pain or respond.  Objective   Blood pressure (!) 94/53, pulse 95, temperature 98.4 F (36.9 C), resp. rate (!) 23, height 5' 4.5" (1.638 m), weight 127.9 kg, last menstrual period 10/14/2012, SpO2 95 %.    Vent Mode: PRVC FiO2 (%):  [40 %] 40 % Set Rate:  [18 bmp] 18 bmp Vt Set:  [500 mL] 500 mL PEEP:  [8 cmH20] 8 cmH20   Intake/Output Summary (Last 24 hours) at 08/30/2022 0811 Last data filed at 08/30/2022 0800 Gross per 24 hour  Intake 3016.06 ml  Output 4920 ml  Net -1903.94 ml   Filed Weights   08/28/22 0310 08/29/22 0300 08/30/22 0300  Weight: 133.8 kg 130.2 kg 127.9 kg    Examination: Physical Exam Constitutional:      General: She is not in acute distress.    Appearance: She is obese. She is ill-appearing.  HENT:     Head: Normocephalic.  Cardiovascular:     Rate and Rhythm: Normal rate and regular rhythm.     Pulses: Normal pulses.     Heart sounds: Normal heart sounds.  Pulmonary:     Comments: Ventilated breath sounds anteriorly on auscultation Abdominal:     General: There is distension.     Palpations: Abdomen is soft.  Musculoskeletal:     Right lower leg: Edema present.     Left lower leg: Edema present.     Comments: Right foot diabetic ulcer  Neurological:     Mental Status: She is disoriented.     Comments: Pupils unequal and very  sluggish to reach. Opens eyes but does not follow commands.     Assessment & Plan:   Neurology #Toxic Metabolic Encephalopathy   This is in the setting of cardiac arrest, metabolic acidosis, toxic metabolite accumulation secondary to ESRD, as well as CO2 narcosis. Patient was intubated in the setting of her cardiac arrest, and was initiated on propofol for sedation with PRN's in place for analgesia. Propofol has been discontinued since Monday. CRRT initiated for clearance of toxic metabolites,  patient ventilated with improvement in her hypercapnia. CT head and MRI brain performed, no findings to suggest hypoxic injury but MRI does show scattered ischemic infarcts but this does not explain her encephalopathy. EEG shows diffuse slowing. Received one dose of Cefepime which was changed to Zosyn. Patient will likely need more time prior to making definitive prognostic assessment thou I am concerned that she's unlikely to make a meaningful neurological recovery.   -Maintain off sedation -Maintain normothermia following cardiac arrest -monitor for CVA given CVC in the carotid -PRN ABG's   Cardiovascular #Cardiac Arrest #Circulatory Shock #HFpEF #Group 2 Pulmonary Hypertension #Inadvertent CVC placement in the carotid   Cardiac arrest was likely secondary to CO2 narcosis and metabolic acidosis, preceded by bradycardia. Blood gas had shown acidosis peri-arrest. Rise in troponin likely secondary to chest compressions and ACLS, with post arrest EKG not suggestive of ischemia nor arrhythmia. Repeat echocardiogram showed dilated right ventricle, elevated PASP, and moderate to severe mitral stenosis similar to prior. She is on CRRT with overall negative balance with ultra-filtration.   Developed circulatory shock post cardiac arrest, and is currently on nor-epinephrine (tapering down) and vasopressin. Will continue to the cover the patient broadly for possible septic shock as a cause of distributive shock and continue with volume removal via CRRT. Furthermore, patient had a central line placed in the carotid artery that was removed with vascular surgery.   -continue vasopressor support with goal MAP 65 -trend troponin and lactic acid -wean stress dose steroids -check lipid panel   Pulmonary #Acute on Chronic Hypoxic Respiratory Failure #OSA/OHS #Asthma/COPD overlap   Intubated in the setting of hypoxic arrest; known history of severe OHS/OSA and asthma/COPD. Currently ventilated with blood  gases showing metabolic acidosis. Will follow blood gas and adjust ventilator settings accordingly. Did have increased secretions from the ETT which were cultured.   -Full vent support, implement lung protective strategies -Plateau pressures less than 30 cm H20 -Wean FiO2 & PEEP as tolerated to maintain O2 sats >92% -Follow intermittent Chest X-ray & ABG as needed -Spontaneous Breathing Trials when respiratory parameters met and mental status permits -Implement VAP Bundle -Continue Bronchodilators   Gastrointestinal #Elevated liver enzymes   LFT's are chronically elevated. RUQ ultrasound on admission did not showing acute findings. Likely NAFLD. Patient also on PPI for SUP and tube feeds were initiated. was on fluconazole which could contribute to LFT elevation and was discontinued. Similarly switched from H2B to PPI.   -repeat RUQ U/S without signs of cholecystitis -trend lipid panel -switch SUP to PPI -avoid hepatotoxins   Renal #ESRD on HD #Anion Gap Metabolic Acidosis   History of ESRD on HD. Given hemodynamic instability, pressor use, and cardiac arrest, will did proceed with CRRT for management of acidosis and volume status. Patient will benefit from volume removal given elevated PASP and signs of RV pressure overload. Appreciate input from nephrology.   Endocrine #T2DM   ICU glycemic protocol. Check lipid panel. wean stress dose steroids.   Hem/Onc heparin subQ for DVT prophylaxis, monitor  H/H and platelet counts   ID #UTI #Aspiration Pneumonia #Sepsis   Patient treated for UTI with Ceftriaxone, which we did broaden, now on Zosyn and Vancomycin, pending respiratory culture results.   -restart vancomycin -d/c metronidazole -d/c doxycycline -switched Cefepime (received one dose) to Zosyn -discontinuation fluconazole with rising LFT's    Best Practice (right click and "Reselect all SmartList Selections" daily)   Diet/type: tubefeeds DVT prophylaxis: prophylactic  heparin  GI prophylaxis: PPI Lines: Central line, Dialysis Catheter, Arterial Line, and yes and it is still needed Foley:  Yes, and it is still needed Code Status:  full code Last date of multidisciplinary goals of care discussion [08/30/2022]  Labs   CBC: Recent Labs  Lab 09/15/2022 1316 08/27/22 0417 08/28/22 0347 08/29/22 0408 08/30/22 0408  WBC 24.7* 20.4* 34.5* 29.8* 29.9*  NEUTROABS 21.6* 16.5* 30.3*  --   --   HGB 14.1 13.8 13.7 13.7 13.3  HCT 42.1 41.0 41.6 40.9 40.1  MCV 88.3 88.0 89.5 88.1 89.1  PLT 284 259 171 117* 104*    Basic Metabolic Panel: Recent Labs  Lab 09/19/2022 0351 09/21/2022 1316 08/27/22 0417 08/27/22 1523 08/28/22 0347 08/28/22 1532 08/29/22 0408 08/29/22 1645 08/30/22 0408  NA 130*   < > 129*   < > 129* 129* 127* 129* 132*  K 5.0   < > 4.2   < > 3.6 3.8 4.0 4.1 3.8  CL 89*   < > 91*   < > 94* 95* 96* 98 101  CO2 16*   < > 18*   < > 20* 20* 18* 19* 21*  GLUCOSE 162*   < > 238*   < > 297* 225* 298* 312* 313*  BUN 64*   < > 49*   < > 41* 34* 33* 39* 33*  CREATININE 6.50*   < > 4.40*   < > 3.16* 2.52* 2.17* 1.93* 1.68*  CALCIUM 8.4*   < > 8.5*   < > 8.7* 8.6* 8.3* 8.3* 8.3*  MG 2.7*  --  2.2  --  2.1  --  2.3  --  2.5*  PHOS  --    < > 5.0*   < > 3.4 2.8 2.6 2.1* 2.8   < > = values in this interval not displayed.   GFR: Estimated Creatinine Clearance: 53 mL/min (A) (by C-G formula based on SCr of 1.68 mg/dL (H)). Recent Labs  Lab 08/28/2022 0351 09/03/2022 0708 09/22/2022 1225 09/08/2022 1316 08/27/22 0417 08/27/22 0812 08/28/22 0347 08/29/22 0408 08/30/22 0408  PROCALCITON  --   --   --  3.71  --   --   --   --   --   WBC 23.0*  --   --  24.7* 20.4*  --  34.5* 29.8* 29.9*  LATICACIDVEN 6.9* 6.2* 5.2*  --   --  2.5*  --   --   --     Liver Function Tests: Recent Labs  Lab 08/24/22 1849 08/25/22 0517 08/31/2022 0351 09/04/2022 1316 09/03/2022 1721 08/28/22 0347 08/28/22 1532 08/29/22 0408 08/29/22 1645 08/30/22 0408  AST 173*  --  263*  329*  --  653*  --  803*  --   --   ALT 57*  --  65* 73*  --  162*  --  247*  --   --   ALKPHOS 597*  --  570* 547*  --  602*  --  722*  --   --   BILITOT 2.0*  --  2.5* 2.5*  --  4.3*  --  4.3*  --   --   PROT 7.1  --  6.5 6.5  --  6.5  --  6.5  --   --   ALBUMIN 3.4*   < > 3.0* 2.9*   < > 2.8*  3.0* 2.8* 2.7*  2.7* 2.8* 2.5*   < > = values in this interval not displayed.   No results for input(s): "LIPASE", "AMYLASE" in the last 168 hours. No results for input(s): "AMMONIA" in the last 168 hours.  ABG    Component Value Date/Time   PHART 7.34 (L) 08/28/2022 0842   PCO2ART 37 08/28/2022 0842   PO2ART 90 08/28/2022 0842   HCO3 20.0 08/28/2022 0842   TCO2 23 10/26/2020 1619   ACIDBASEDEF 5.2 (H) 08/28/2022 0842   O2SAT 98.4 08/28/2022 0842     Coagulation Profile: No results for input(s): "INR", "PROTIME" in the last 168 hours.  Cardiac Enzymes: No results for input(s): "CKTOTAL", "CKMB", "CKMBINDEX", "TROPONINI" in the last 168 hours.  HbA1C: Hgb A1c MFr Bld  Date/Time Value Ref Range Status  08/25/2022 12:11 PM 7.8 (H) 4.8 - 5.6 % Final    Comment:    (NOTE) Pre diabetes:          5.7%-6.4%  Diabetes:              >6.4%  Glycemic control for   <7.0% adults with diabetes   04/16/2022 02:15 AM 7.3 (H) 4.8 - 5.6 % Final    Comment:    (NOTE) Pre diabetes:          5.7%-6.4%  Diabetes:              >6.4%  Glycemic control for   <7.0% adults with diabetes     CBG: Recent Labs  Lab 08/29/22 1634 08/29/22 1912 08/29/22 2321 08/30/22 0317 08/30/22 0728  GLUCAP 273* 242* 247* 262* 247*    Review of Systems:   Unable to obtain  Past Medical History:  She,  has a past medical history of (HFpEF) heart failure with preserved ejection fraction (HCC), Anxiety, Asthma, Bronchitis, CHF (congestive heart failure) (HCC), Chronic cough, Chronic respiratory failure with hypoxia and hypercapnia (HCC), CKD (chronic kidney disease), stage III (HCC), Depression,  Diabetes mellitus without complication (HCC), Diverticulosis, Dyspnea, Dysrhythmia, Environmental and seasonal allergies, Extreme obesity with alveolar hypoventilation (HCC), Hypertension, Iron deficiency anemia, Kidney failure, Orthopnea, Oxygen dependent, Pericardial effusion, Pneumonia, Poorly controlled diabetes mellitus (HCC), and Swelling of both lower extremities.   Surgical History:   Past Surgical History:  Procedure Laterality Date   A/V FISTULAGRAM Left 06/09/2017   Procedure: A/V FISTULAGRAM;  Surgeon: Annice Needy, MD;  Location: [facility];  Service: Cardiovascular;  Laterality: Left;   A/V FISTULAGRAM Left 06/16/2017   Procedure: A/V FISTULAGRAM;  Surgeon: Annice Needy, MD;  Location: [facility];  Service: Cardiovascular;  Laterality: Left;   A/V FISTULAGRAM Left 01/28/2018   Procedure: A/V FISTULAGRAM;  Surgeon: Annice Needy, MD;  Location: [facility];  Service: Cardiovascular;  Laterality: Left;   AMPUTATION TOE Right 10/21/2020   Procedure: AMPUTATION TOE-5th Digit Amputation Right Foot;  Surgeon: Felecia Shelling, DPM;  Location: [facility];  Service: Podiatry;  Laterality: Right;   AV FISTULA PLACEMENT Left 04/25/2017   Procedure: ARTERIOVENOUS (AV) FISTULA CREATION;  Surgeon: Annice Needy, MD;  Location: [facility];  Service: Vascular;  Laterality: Left;   BONE BIOPSY Right 04/27/2021  Procedure: Right 4th metatarsal BONE BIOPSY;  Surgeon: Candelaria Stagers, DPM;  Location: [facility];  Service: Podiatry;  Laterality: Right;   CATARACT EXTRACTION W/PHACO Left 01/21/2018   Procedure: CATARACT EXTRACTION PHACO AND INTRAOCULAR LENS PLACEMENT (IOC);  Surgeon: Lockie Mola, MD;  Location: [facility];  Service: Ophthalmology;  Laterality: Left;  [place] 01:55.5 CDE 9.05 EAUP 13.9 Fluid Pack Lot # [id number]   CENTRAL LINE INSERTION N/A 09/17/2022   Procedure: CENTRAL LINE INSERTION;  Surgeon: Renford Dills, MD;  Location: [facility];  Service:  Cardiovascular;  Laterality: N/A;   CERVICAL CONIZATION W/BX N/A 08/09/2022   Procedure: CERVICAL BIOPSY;  Surgeon: Feliberto Gottron, Ihor Austin, MD;  Location: [facility];  Service: Gynecology;  Laterality: N/A;   CESAREAN SECTION     times 3   DIALYSIS/PERMA CATHETER INSERTION Right 04/04/2017   Procedure: DIALYSIS/PERMA CATHETER INSERTION;  Surgeon: Renford Dills, MD;  Location: [facility];  Service: Cardiovascular;  Laterality: Right;   DIALYSIS/PERMA CATHETER REMOVAL N/A 09/17/2017   Procedure: DIALYSIS/PERMA CATHETER REMOVAL;  Surgeon: Annice Needy, MD;  Location: [facility];  Service: Cardiovascular;  Laterality: N/A;   INCISION AND DRAINAGE Right 10/26/2020   Procedure: INCISION AND DRAINAGE;  Surgeon: Candelaria Stagers, DPM;  Location: [facility];  Service: Podiatry;  Laterality: Right;   INSERTION OF DIALYSIS CATHETER N/A 04/16/2017   Procedure: INSERTION OF DIALYSIS PERM CATHETER;  Surgeon: Annice Needy, MD;  Location: [facility];  Service: Vascular;  Laterality: N/A;   LOWER EXTREMITY ANGIOGRAPHY Right 10/18/2020   Procedure: Lower Extremity Angiography;  Surgeon: Annice Needy, MD;  Location: [facility];  Service: Cardiovascular;  Laterality: Right;   LOWER EXTREMITY ANGIOGRAPHY Right 04/17/2022   Procedure: Lower Extremity Angiography;  Surgeon: Annice Needy, MD;  Location: [facility];  Service: Cardiovascular;  Laterality: Right;     Social History:   reports that she has never smoked. She has been exposed to tobacco smoke. She has never used smokeless tobacco. She reports that she does not currently use alcohol. She reports current drug use. Drug: Marijuana.   Family History:  Her family history includes Breast cancer (age of onset: 99) in her maternal aunt and maternal aunt; Diabetes in her father, mother, and another family member; Hypertension in her father, mother, and another family member.   Allergies Allergies  Allergen Reactions   Dust Mite  Extract Shortness Of Breath and Itching   Mold Extract [Trichophyton] Shortness Of Breath and Itching   Pollen Extract Shortness Of Breath    Itching, shortness of breath, asthma like symptoms   Ozempic (0.25 Or 0.5 Mg-Dose) [Semaglutide(0.25 Or 0.5mg -Dos)] Other (See Comments)    pancreatitis   Sulfa Antibiotics Itching   Feraheme [Ferumoxytol] Itching    Uses benadryl so she can get the medicine     Home Medications  Prior to Admission medications   Medication Sig Start Date End Date Taking? Authorizing Provider  acetaminophen (TYLENOL) 500 MG tablet Take 1,000 mg by mouth every 6 (six) hours as needed for mild pain, fever or headache.   Yes [provider]  albuterol (PROVENTIL) (2.5 MG/3ML) 0.083% nebulizer solution Take 3 mLs (2.5 mg total) by nebulization every 6 (six) hours as needed for wheezing or shortness of breath. 02/15/22  Yes Salena Saner, MD  albuterol (VENTOLIN HFA) 108 (90 Base) MCG/ACT inhaler Inhale 2 puffs into the lungs every 6 (six) hours as needed for wheezing or shortness of  breath. 02/15/22  Yes Salena Saner, MD  atorvastatin (LIPITOR) 40 MG tablet Take 1 tablet (40 mg total) by mouth daily. 04/20/22  Yes Regalado, Belkys A, MD  benzonatate (TESSALON PERLES) 100 MG capsule Take 1 capsule (100 mg total) by mouth 3 (three) times daily as needed for cough. 05/22/22 05/22/23 Yes Boswell, Chelsa, NP  budesonide (PULMICORT) 0.5 MG/2ML nebulizer solution USE 1 VIAL  IN  NEBULIZER TWICE  DAILY - (Rinse Mouth After Each Treatment) 08/13/22  Yes Orson Eva, NP  ciprofloxacin (CIPRO) 500 MG tablet Take 500 mg by mouth daily. 08/13/22  Yes [provider]  clindamycin (CLEOCIN T) 1 % lotion APPLY TO FACE, ARMS AND LEGS EVENLY NIGHTLY AT BEDTIME 06/27/22  Yes Boswell, Chelsa, NP  escitalopram (LEXAPRO) 10 MG tablet Take 1 tablet (10 mg total) by mouth daily. 04/24/22 04/24/23 Yes Boswell, Gareth Morgan, NP  formoterol (PERFOROMIST) 20 MCG/2ML nebulizer solution  USE 1 VIAL  IN  NEBULIZER TWICE  DAILY - (Morning And Evening) 08/13/22  Yes Boswell, Chelsa, NP  gentamicin cream (GARAMYCIN) 0.1 % Apply 1 Application topically at bedtime. 06/27/22  Yes [provider]  insulin degludec (TRESIBA) 100 UNIT/ML FlexTouch Pen Inject 20 Units into the skin daily. 04/20/22  Yes Regalado, Belkys A, MD  lidocaine-prilocaine (EMLA) cream Apply topically as needed (For access for dialysis).   Yes [provider]  LINZESS 290 MCG CAPS capsule TAKE 1 CAPSULE BY MOUTH IN THE MORNING WITH A GLASS OF WATER 30 MINUTES BEFORE BREAKFAST 08/08/22  Yes Miki Kins, FNP  methocarbamol (ROBAXIN) 750 MG tablet Take 750 mg by mouth every 8 (eight) hours as needed for muscle spasms.   Yes [provider]  midodrine (PROAMATINE) 10 MG tablet Take 20 mg by mouth daily as needed (for low blood pressure on dialysis days). 08/23/21  Yes [provider]  ondansetron (ZOFRAN) 4 MG tablet Take 4 mg by mouth 2 (two) times daily as needed for nausea. 07/02/22  Yes [provider]  pregabalin (LYRICA) 50 MG capsule TAKE 1 CAPSULE BY MOUTH EVERY 8 HOURS AS NEEDED FOR  NEUROPATHY 07/01/22  Yes Orson Eva, NP  revefenacin (YUPELRI) 175 MCG/3ML nebulizer solution  01/19/21  Yes [provider]  sevelamer (RENAGEL) 800 MG tablet Take 800-2,400 mg by mouth 4 (four) times daily. Three tablets three times daily with meals and 1 tablet with snacks once daily   Yes [provider]  torsemide (DEMADEX) 100 MG tablet Take 100 mg by mouth daily. 05/21/22  Yes [provider]  aspirin EC 81 MG tablet Take 81 mg by mouth daily.  Patient not taking: Reported on 08/19/2022    [provider]  cloNIDine (CATAPRES) 0.1 MG tablet Take 0.1 mg by mouth 2 (two) times daily. Patient not taking: Reported on 08/19/2022 03/21/22   [provider]  doxepin (SINEQUAN) 10 MG capsule Take 1 capsule (10 mg total) by mouth at bedtime. Patient  not taking: Reported on 08/19/2022 04/24/22   Orson Eva, NP  ferric citrate (AURYXIA) 1 GM 210 MG(Fe) tablet Take 210 mg by mouth 3 (three) times daily with meals. Only taking once a day most days Patient not taking: Reported on 08/19/2022    [provider]  insulin lispro (HUMALOG) 100 UNIT/ML KwikPen Inject into the skin. Patient not taking: Reported on 08/19/2022 06/12/21   [provider]  lactulose (CHRONULAC) 10 GM/15ML solution Take 30 mLs (20 g total) by mouth daily as needed for severe constipation. Patient  not taking: Reported on 08/19/2022 04/26/17   Katha Hamming, MD  montelukast (SINGULAIR) 10 MG tablet Take 1 tablet (10 mg total) by mouth at bedtime. Patient not taking: Reported on 08/19/2022 09/17/21   Salena Saner, MD  Omeprazole Magnesium (PRILOSEC PO)     [provider]  OXYGEN Inhale 2-4 L into the lungs.    [provider]  predniSONE (DELTASONE) 50 MG tablet Take 1 tablet by mouth daily in AM with food x 5 days Patient not taking: Reported on 08/19/2022 04/24/22   Orson Eva, NP  RENVELA 2.4 g PACK Take 2.4 g by mouth 3 (three) times daily. Patient not taking: Reported on 08/19/2022 08/07/21   [provider]  sucroferric oxyhydroxide (VELPHORO) 500 MG chewable tablet Chew 500 mg by mouth 3 (three) times daily with meals.    [provider]  Vitamin D, Cholecalciferol, 25 MCG (1000 UT) TABS Take 1,000 Units by mouth daily. Patient not taking: Reported on 08/19/2022    [provider]     Critical care time: 38 minutes     Raechel Chute, MD [facility] 08/30/2022 8:38 AM

## 2022-08-31 DIAGNOSIS — G9341 Metabolic encephalopathy: Secondary | ICD-10-CM | POA: Diagnosis not present

## 2022-08-31 DIAGNOSIS — G931 Anoxic brain damage, not elsewhere classified: Secondary | ICD-10-CM | POA: Diagnosis not present

## 2022-08-31 DIAGNOSIS — I5032 Chronic diastolic (congestive) heart failure: Secondary | ICD-10-CM | POA: Diagnosis not present

## 2022-08-31 DIAGNOSIS — J9621 Acute and chronic respiratory failure with hypoxia: Secondary | ICD-10-CM | POA: Diagnosis not present

## 2022-08-31 LAB — CBC
HCT: 43 % (ref 36.0–46.0)
Hemoglobin: 14.1 g/dL (ref 12.0–15.0)
MCH: 30.2 pg (ref 26.0–34.0)
MCHC: 32.8 g/dL (ref 30.0–36.0)
MCV: 92.1 fL (ref 80.0–100.0)
Platelets: 117 10*3/uL — ABNORMAL LOW (ref 150–400)
RBC: 4.67 MIL/uL (ref 3.87–5.11)
RDW: 22.4 % — ABNORMAL HIGH (ref 11.5–15.5)
WBC: 29.4 10*3/uL — ABNORMAL HIGH (ref 4.0–10.5)
nRBC: 7.1 % — ABNORMAL HIGH (ref 0.0–0.2)

## 2022-08-31 LAB — RENAL FUNCTION PANEL
Albumin: 2.5 g/dL — ABNORMAL LOW (ref 3.5–5.0)
Albumin: 2.4 g/dL — ABNORMAL LOW (ref 3.5–5.0)
Anion gap: 14 (ref 5–15)
Anion gap: 10 (ref 5–15)
BUN: 30 mg/dL — ABNORMAL HIGH (ref 6–20)
BUN: 39 mg/dL — ABNORMAL HIGH (ref 6–20)
CO2: 23 mmol/L (ref 22–32)
CO2: 19 mmol/L — ABNORMAL LOW (ref 22–32)
Calcium: 8.6 mg/dL — ABNORMAL LOW (ref 8.9–10.3)
Calcium: 7.6 mg/dL — ABNORMAL LOW (ref 8.9–10.3)
Chloride: 99 mmol/L (ref 98–111)
Chloride: 102 mmol/L (ref 98–111)
Creatinine, Ser: 1.33 mg/dL — ABNORMAL HIGH (ref 0.44–1.00)
Creatinine, Ser: 1.53 mg/dL — ABNORMAL HIGH (ref 0.44–1.00)
GFR, Estimated: 48 mL/min — ABNORMAL LOW (ref >60)
GFR, Estimated: 41 mL/min — ABNORMAL LOW (ref >60)
Glucose, Bld: 190 mg/dL — ABNORMAL HIGH (ref 70–99)
Glucose, Bld: 221 mg/dL — ABNORMAL HIGH (ref 70–99)
Phosphorus: 2.1 mg/dL — ABNORMAL LOW (ref 2.5–4.6)
Phosphorus: 2.5 mg/dL (ref 2.5–4.6)
Potassium: 4 mmol/L (ref 3.5–5.1)
Potassium: 3.7 mmol/L (ref 3.5–5.1)
Sodium: 136 mmol/L (ref 135–145)
Sodium: 131 mmol/L — ABNORMAL LOW (ref 135–145)

## 2022-08-31 LAB — GLUCOSE, CAPILLARY
Glucose-Capillary: 168 mg/dL — ABNORMAL HIGH (ref 70–99)
Glucose-Capillary: 192 mg/dL — ABNORMAL HIGH (ref 70–99)
Glucose-Capillary: 194 mg/dL — ABNORMAL HIGH (ref 70–99)
Glucose-Capillary: 194 mg/dL — ABNORMAL HIGH (ref 70–99)
Glucose-Capillary: 197 mg/dL — ABNORMAL HIGH (ref 70–99)
Glucose-Capillary: 202 mg/dL — ABNORMAL HIGH (ref 70–99)
Glucose-Capillary: 214 mg/dL — ABNORMAL HIGH (ref 70–99)

## 2022-08-31 LAB — IGP, APTIMA HPV: HPV Aptima: NEGATIVE

## 2022-08-31 LAB — AMMONIA: Ammonia: 54 umol/L — ABNORMAL HIGH (ref 9–35)

## 2022-08-31 LAB — MAGNESIUM: Magnesium: 2.5 mg/dL — ABNORMAL HIGH (ref 1.7–2.4)

## 2022-08-31 MED ORDER — SODIUM CHLORIDE 0.9 % IV SOLN: INTRAVENOUS | Status: DC | PRN

## 2022-08-31 MED ORDER — K PHOS MONO-SOD PHOS DI & MONO 155-852-130 MG PO TABS
500.0000 mg | ORAL_TABLET | Freq: Once | ORAL | Status: AC
  Administered 2022-08-31: 500 mg
  Filled 2022-08-31: qty 2

## 2022-08-31 NOTE — IPAL (Signed)
  Interdisciplinary Goals of Care Family Meeting   Date carried out: 08/31/2022  Location of the meeting: Conference room  Member's involved: Physician and Family Member or next of kin  Durable Power of Attorney or acting medical decision maker: Family members: husband, four adult children, brother, sister.    Discussion: We discussed goals of care for [name] .  Discussed [name]' prognosis given her vegetative state following her cardiac arrest last week. Explained that the chances of any meaningful neurological recovery are dismal given she's not shown any improvement this long following her cardiac arrest. Explained the impression and recommendation from neurology. Family unanimously want to withdraw care tomorrow after they have had the chance to say their goodbyes.  Code status:   Code Status: Full Code   Disposition: In-patient comfort care tomorrow  Time spent for the meeting: 20 minutes.    Raechel Chute, MD  08/31/2022, 5:30 PM

## 2022-08-31 NOTE — Progress Notes (Signed)
Mild temp noted and CCM will allow some permissive hyperthermia.

## 2022-08-31 NOTE — Progress Notes (Signed)
[facility]  ROUNDING NOTE   Subjective:   [name] is a 52 year old African-American female with a past medical history of end-stage renal disease, patient is on Tuesday Thursday Saturday schedule, diabetes mellitus, history of pancreatitis who came to the ER with chief complaint of abdominal pain with foul smelling urine for 2 weeks.  Patient is known to our clinic and receives outpatient dialysis treatments at [facility] on a TTS schedule  Update:  Patient remains intubated  No sedation, eyes closed Vent 40% FiO2/PEEP 8 Pressors:  Vaso, phenylephrine and amiodarone  Tube feeds 4ml/hr  CRRT in place, UF 100 ml/hr  Objective:  Vital signs in last 24 hours:  Temp:  [93.2 F (34 C)-99.3 F (37.4 C)] 98.8 F (37.1 C) (07/06 0715) Pulse Rate:  [80-106] 89 (07/06 0715) Resp:  [19-36] 25 (07/06 0715) BP: (73-150)/(18-109) 96/18 (07/05 1300) SpO2:  [90 %-100 %] 100 % (07/06 0802) Arterial Line BP: (82-129)/(42-68) 113/58 (07/06 0715) FiO2 (%):  [40 %-50 %] 40 % (07/06 0802) Weight:  [829 kg] 128 kg (07/06 0203)  Weight change: 0.086 kg Filed Weights   08/29/22 0300 08/30/22 0300 08/31/22 0203  Weight: 130.2 kg 127.9 kg 128 kg    Intake/Output: I/O last 3 completed shifts: In: 4479.4 [I.V.:1939.2; Other:60; NG/GT:1930; IV Piggyback:550.2] Out: 2961 [Stool:500]   Intake/Output this shift:  Total I/O In: 140.8 [I.V.:80.8; NG/GT:60] Out: 85 [Stool:85]  Physical Exam: General: Ill appearing  Head: Normocephalic, atraumatic.  Eyes: Anicteric  Lungs:  Vent  Heart: Regular rate and rhythm  Abdomen:  Soft, nontender, obese  Extremities:  No peripheral edema.  Neurologic: Open eyes to voice (off and on)  Skin: Chronic right foot wound- bandaged, prurigo nodules  Access: Lt AVF (no bruit/thrill)    Basic Metabolic Panel: Recent Labs  Lab 08/27/22 0417 08/27/22 1523 08/28/22 0347 08/28/22 1532 08/29/22 0408 08/29/22 1645 08/30/22 0408  08/30/22 1535 08/31/22 0257  NA 129*   < > 129*   < > 127* 129* 132* 132* 136  K 4.2   < > 3.6   < > 4.0 4.1 3.8 4.2 4.0  CL 91*   < > 94*   < > 96* 98 101 101 99  CO2 18*   < > 20*   < > 18* 19* 21* 19* 23  GLUCOSE 238*   < > 297*   < > 298* 312* 313* 238* 190*  BUN 49*   < > 41*   < > 33* 39* 33* 32* 30*  CREATININE 4.40*   < > 3.16*   < > 2.17* 1.93* 1.68* 1.45* 1.33*  CALCIUM 8.5*   < > 8.7*   < > 8.3* 8.3* 8.3* 8.4* 8.6*  MG 2.2  --  2.1  --  2.3  --  2.5*  --  2.5*  PHOS 5.0*   < > 3.4   < > 2.6 2.1* 2.8 2.8 2.1*   < > = values in this interval not displayed.     Liver Function Tests: Recent Labs  Lab 09/25/2022 0351 09/11/2022 1316 09/24/2022 1721 08/28/22 0347 08/28/22 1532 08/29/22 0408 08/29/22 1645 08/30/22 0407 08/30/22 0408 08/30/22 1535 08/31/22 0257  AST 263* 329*  --  653*  --  803*  --  388*  --   --   --   ALT 65* 73*  --  162*  --  247*  --  205*  --   --   --   ALKPHOS 570*  547*  --  602*  --  722*  --  689*  --   --   --   BILITOT 2.5* 2.5*  --  4.3*  --  4.3*  --  3.7*  --   --   --   PROT 6.5 6.5  --  6.5  --  6.5  --  6.2*  --   --   --   ALBUMIN 3.0* 2.9*   < > 2.8*  3.0*   < > 2.7*  2.7* 2.8* 2.5* 2.5* 2.5* 2.5*   < > = values in this interval not displayed.    No results for input(s): "LIPASE", "AMYLASE" in the last 168 hours. Recent Labs  Lab 08/31/22 0736  AMMONIA 54*    CBC: Recent Labs  Lab 09/10/2022 1316 08/27/22 0417 08/28/22 0347 08/29/22 0408 08/30/22 0408 08/31/22 0257  WBC 24.7* 20.4* 34.5* 29.8* 29.9* 29.4*  NEUTROABS 21.6* 16.5* 30.3*  --   --   --   HGB 14.1 13.8 13.7 13.7 13.3 14.1  HCT 42.1 41.0 41.6 40.9 40.1 43.0  MCV 88.3 88.0 89.5 88.1 89.1 92.1  PLT 284 259 171 117* 104* 117*     Cardiac Enzymes: No results for input(s): "CKTOTAL", "CKMB", "CKMBINDEX", "TROPONINI" in the last 168 hours.  BNP: Invalid input(s): "POCBNP"  CBG: Recent Labs  Lab 08/30/22 2012 08/30/22 2355 08/31/22 0348 08/31/22 0748  08/31/22 0751  GLUCAP 193* 215* 194* 194* 168*     Microbiology: Results for orders placed or performed during the hospital encounter of 08/12/2022  Urine Culture     Status: Abnormal   Collection Time: 08/19/2022  3:07 PM   Specimen: Urine, Clean Catch  Result Value Ref Range Status   Specimen Description   Final    URINE, CLEAN CATCH Performed at [facility], [address] [place], [place] [address]    Special Requests   Final    NONE Performed at [facility], [address] [place], [place] [address]    Culture >=100,000 COLONIES/mL YEAST (A)  Final   Report Status 08/20/2022 FINAL  Final  Wet prep, genital     Status: Abnormal   Collection Time: 08/20/22  6:35 PM  Result Value Ref Range Status   Yeast Wet Prep HPF POC PRESENT (A) NONE SEEN Corrected    Comment: CORRECTED RESULTS CALLED TO: JEFF STRENK 08/22/22 1412 SH CORRECTED ON 06/27 AT 1406: PREVIOUSLY REPORTED AS NONE SEEN    Trich, Wet Prep NONE SEEN NONE SEEN Final   Clue Cells Wet Prep HPF POC NONE SEEN NONE SEEN Final   WBC, Wet Prep HPF POC >=10 (A) <10 Final   Sperm NONE SEEN  Final    Comment: Performed at [facility], [address] [place], [place] [address]  Chlamydia/NGC rt PCR [facility] only)     Status: None   Collection Time: 08/20/22  6:35 PM   Specimen: Cervical/Vaginal swab; Genital  Result Value Ref Range Status   Specimen source GC/Chlam ENDOCERVICAL  Final   Chlamydia Tr NOT DETECTED NOT DETECTED Final   N gonorrhoeae NOT DETECTED NOT DETECTED Final    Comment: (NOTE) This CT/NG assay has not been evaluated in patients with a history of  hysterectomy. Performed at [facility], [address] [place], [place] [address]   Culture, blood (Routine X 2) w Reflex to ID Panel     Status: None   Collection Time: 08/25/22 12:59 PM   Specimen: BLOOD  Result Value Ref Range Status   Specimen Description BLOOD BLOOD RIGHT ARM RAC  Final   Special Requests    Final    BOTTLES DRAWN AEROBIC AND ANAEROBIC Blood Culture adequate volume   Culture   Final    NO GROWTH 5 DAYS Performed at [facility], [address], [place], [place] [address]    Report Status 08/30/2022 FINAL  Final  MRSA Next Gen by PCR, Nasal     Status: None   Collection Time: 08/25/22  2:01 PM   Specimen: Nasal Mucosa; Nasal Swab  Result Value Ref Range Status   MRSA by PCR Next Gen NOT DETECTED NOT DETECTED Final    Comment: (NOTE) The GeneXpert MRSA Assay (FDA approved for NASAL specimens only), is one component of a comprehensive MRSA colonization surveillance program. It is not intended to diagnose MRSA infection nor to guide or monitor treatment for MRSA infections. Test performance is not FDA approved in patients less than 70 years old. Performed at [facility], [address] [place], [place] [address]   Culture, blood (Routine X 2) w Reflex to ID Panel     Status: None   Collection Time: 08/25/22  3:59 PM   Specimen: BLOOD  Result Value Ref Range Status   Specimen Description BLOOD PICC LINE  Final   Special Requests   Final    BOTTLES DRAWN AEROBIC AND ANAEROBIC Blood Culture adequate volume   Culture   Final    NO GROWTH 5 DAYS Performed at [facility], [address] [place], [place] [address]    Report Status 08/30/2022 FINAL  Final  Culture, Respiratory w Gram Stain     Status: None   Collection Time: 09/15/2022  1:28 PM   Specimen: Tracheal Aspirate; Respiratory  Result Value Ref Range Status   Specimen Description   Final    TRACHEAL ASPIRATE Performed at [facility], [address], [place], [place] [address]    Special Requests   Final    NONE Performed at [facility], [address] [place], [place] [address]    Gram Stain   Final    ABUNDANT WBC PRESENT, PREDOMINANTLY PMN RARE BUDDING YEAST SEEN RARE GRAM POSITIVE COCCI IN PAIRS    Culture   Final    FEW Consistent with normal  respiratory flora. Performed at [facility], [address] [place], [place] [address]    Report Status 08/28/2022 FINAL  Final  MRSA Next Gen by PCR, Nasal     Status: None   Collection Time: 09/15/2022  5:21 PM   Specimen: Nasal Mucosa; Nasal Swab  Result Value Ref Range Status   MRSA by PCR Next Gen NOT DETECTED NOT DETECTED Final    Comment: (NOTE) The GeneXpert MRSA Assay (FDA approved for NASAL specimens only), is one component of a comprehensive MRSA colonization surveillance program. It is not intended to diagnose MRSA infection nor to guide or monitor treatment for MRSA infections. Test performance is not FDA approved in patients less than 55 years old. Performed at [facility], [address] [place], [place] [address]   Culture, Respiratory w Gram Stain     Status: None   Collection Time: 08/27/22  7:30 PM   Specimen: Tracheal Aspirate; Respiratory  Result Value Ref Range Status   Specimen Description   Final    TRACHEAL ASPIRATE Performed at [facility], [address], [place], [place] [address]    Special  Requests   Final    NONE Performed at [facility], [address] [place], [place] [address]    Gram Stain   Final    ABUNDANT WBC PRESENT, PREDOMINANTLY PMN RARE GRAM POSITIVE COCCI IN PAIRS    Culture   Final    RARE Normal respiratory flora-no Staph aureus or Pseudomonas seen Performed at [facility], [address] [place], [place] [address]    Report Status 08/30/2022 FINAL  Final    Coagulation Studies: No results for input(s): "LABPROT", "INR" in the last 72 hours.  Urinalysis: No results for input(s): "COLORURINE", "LABSPEC", "PHURINE", "GLUCOSEU", "HGBUR", "BILIRUBINUR", "KETONESUR", "PROTEINUR", "UROBILINOGEN", "NITRITE", "LEUKOCYTESUR" in the last 72 hours.  Invalid input(s): "APPERANCEUR"     Imaging: CT HEAD WO CONTRAST ( )  Result Date: 08/29/2022 CLINICAL DATA:  Altered mental status  EXAM: CT HEAD WITHOUT CONTRAST TECHNIQUE: Contiguous axial images were obtained from the base of the skull through the vertex without intravenous contrast. RADIATION DOSE REDUCTION: This exam was performed according to the departmental dose-optimization program which includes automated exposure control, adjustment of the mA and/or kV according to patient size and/or use of iterative reconstruction technique. COMPARISON:  08/27/2022 FINDINGS: Brain: There is no mass, hemorrhage or extra-axial collection. The size and configuration of the ventricles and extra-axial CSF spaces are normal. The brain parenchyma is normal, without acute or chronic infarction. Vascular: Atherosclerotic calcification of the vertebral and internal carotid arteries at the skull base. No abnormal hyperdensity of the major intracranial arteries or dural venous sinuses. Skull: The visualized skull base, calvarium and extracranial soft tissues are normal. Sinuses/Orbits: Opacification of the right mastoid and middle ear cavity. Partial opacification of the left mastoid air cells. The orbits are normal. IMPRESSION: 1. No acute intracranial abnormality. 2. Opacification of the right mastoid and middle ear cavity. Partial opacification of the left mastoid air cells. Electronically Signed   By: Deatra Robinson M.D.   On: 08/29/2022 20:07     Medications:     prismasol BGK 4/2.5 300 mL/hr at 08/30/22 2328    prismasol BGK 4/2.5 300 mL/hr at 08/30/22 2327   amiodarone 30 mg/hr (08/31/22 0800)   anticoagulant sodium citrate     feeding supplement (VITAL AF 1.2 CAL) 60 mL/hr at 08/31/22 0800   norepinephrine (LEVOPHED) Adult infusion Stopped (08/30/22 1052)   phenylephrine (NEO-SYNEPHRINE) Adult infusion 220 mcg/min (08/31/22 0835)   piperacillin-tazobactam (ZOSYN)  IV Stopped (08/31/22 0981)   prismasol BGK 4/2.5 2,000 mL/hr at 08/31/22 0611   propofol (DIPRIVAN) infusion Stopped (08/28/22 1914)   vancomycin Stopped (08/29/22 1609)    vasopressin 0.04 Units/min (08/31/22 0844)    arformoterol  15 mcg Nebulization Q12H   aspirin  81 mg Per Tube Daily   budesonide  0.5 mg Nebulization BID   Chlorhexidine Gluconate Cloth  6 each Topical Daily   cholecalciferol  1,000 Units Per Tube Daily   free water  30 mL Per Tube Q4H   heparin injection (subcutaneous)  5,000 Units Subcutaneous Q12H   hydrocortisone sod succinate (SOLU-CORTEF) inj  50 mg Intravenous Q12H   insulin aspart  0-20 Units Subcutaneous Q4H   insulin aspart  7 Units Subcutaneous Q4H   insulin detemir  10 Units Subcutaneous BID   montelukast  10 mg Per Tube QHS   multivitamin  1 tablet Per Tube QHS   nutrition supplement (JUVEN)  1 packet Per Tube BID BM   mouth rinse  15 mL Mouth Rinse Q2H   pantoprazole (PROTONIX)  IV  40 mg Intravenous Q24H   phosphorus  500 mg Per Tube Once   sevelamer carbonate  2.4 g Per Tube TID WC   albuterol, anticoagulant sodium citrate, bisacodyl, docusate, heparin, heparin, lidocaine (PF), lidocaine-prilocaine, naLOXone (NARCAN)  injection, mouth rinse, pentafluoroprop-tetrafluoroeth, polyethylene glycol, polyvinyl alcohol, sodium chloride  Assessment/ Plan:  Ms. KATHERLINE DAVIE is a 52 y.o.  female with a past medical history of end-stage renal disease, patient is on Tuesday Thursday Saturday schedule, diabetes mellitus, history of pancreatitis who came to the ER with chief complaint of abdominal pain with foul smelling urine for 2 weeks.  [facility] TTS Left AVF 127.5kg   End stage renal disease on hemodialysis. CRRT initiated on 09/08/2022.  CRRT remains in place, UF temporarily decreased due to tachycardia and hypotension.   - Will continue CRRT, maintain net even for now - potassium stable, continue 4K dialysate   2. Anemia assessment Lab Results  Component Value Date   HGB 14.1 08/31/2022    Hgb acceptable.  No need for ESA  3. Secondary Hyperparathyroidism: with outpatient labs: PTH 2361, phosphorus 9.0, calcium  8.4 on 08/06/22.   Lab Results  Component Value Date   PTH 134 (H) 04/21/2017   CALCIUM 8.6 (L) 08/31/2022   CAION 0.81 (LL) 10/26/2020   PHOS 2.1 (L) 08/31/2022  Calcium stable, phosphorus decreased.   Continue cholecalciferol. Will hold sevelamer.   4. Diabetes mellitus type II with chronic kidney disease/renal manifestations: noninsulin dependent. Most recent hemoglobin A1c is 7.8 on 08/17/2022.   Improved management of glucose Primary team to manage sliding scale insulin  5.  Acute respiratory failure Ventilator assisted.  6.  Hypotension Requiring pressors-vasopressin,amio and neo.  7.  Altered mental status Off sedation now MRI of the brain shows few scattered subcentimeter's acute ischemic infarcts involving bilateral cerebral and cerebellar hemispheres.  Central thromboembolic etiology is suspected.    LOS: 13 Rockie Vawter 7/6/20248:58 AM

## 2022-08-31 NOTE — Progress Notes (Signed)
PHARMACY CONSULT NOTE  Pharmacy Consult for Electrolyte Monitoring and Replacement   Recent Labs: Potassium (mmol/L)  Date Value  08/31/2022 4.0  11/21/2013 4.1   Magnesium (mg/dL)  Date Value  65/78/4696 2.5 (H)   Calcium (mg/dL)  Date Value  29/52/8413 8.6 (L)   Calcium, Total (mg/dL)  Date Value  24/40/1027 8.7   Albumin (g/dL)  Date Value  25/36/6440 2.5 (L)   Phosphorus (mg/dL)  Date Value  34/74/2595 2.1 (L)   Sodium (mmol/L)  Date Value  08/31/2022 136  11/21/2013 135 (L)    Assessment: 52 y/o female with h/o uterine fibroid, IDA, anxiety, COPD, asthma, OSA, CHF, ESRD on HD, PVD with chronic foot wound, depression, GERD, marijuana use, pulmonary hypertension and cervical mass who is admitted with UTI and encephalopathy complicated by respiaratory failure and cardiac arrest requiring intubation 7/1 now on CRRT  On amio gtt.  On free water 30 ml q4H On sevelamer carbonate 2.4 g TID.   Goal of Therapy:  Electrolytes within normal limits  Plan:  Kphos 2 tabs x 1 F/u with AM labs.   Ronnald Ramp, PharmD, BCPS 08/31/2022 8:03 AM

## 2022-08-31 NOTE — Consult Note (Addendum)
Neurology Consultation Reason for Consult: Neurological prognostication Requesting Physician: Raechel Chute  CC: Initial complaint of flank pain and dysuria, course complicated by PEA arrest  History is obtained from: Chart review and team at bedside  HPI: [name] is a 52 y.o. female . With a past medical history significant for ESRD on IHD (Tuesday Thursday Saturday), pulmonary hypertension, COPD, asthma, OSA/obesity hypoventilation syndrome, 4 L baseline O2 requirement, diabetes, hypertension, heart failure with preserved EF, BMI of 47.699, chronic right foot wound with concern for possible osteomyelitis, degenerating uterine fibroid, anxiety, lumbar spinal stenosis with neurogenic claudication, chronic pain  She initially presented 6/23 for dysuria and right flank pain, with cultures eventually growing yeast.  She has been treated with both antibiotics as well as antifungal agents.  She was having GU discharge, unclear if this was urinary or genital initially, but due to persistent discharge gynecological evaluation was obtained and she had a cervical biopsy completed on 6/26 with multiple biopsies sent ultimately resulting with erosion/ulceration without clear malignancy.  She had a great deal of trouble tolerating BiPAP and in the setting was intermittently somnolent.  She was also hypotensive with dialysis.  Eventually she had a central venous line placed on 6/30 and subsequently at 2 AM on 7/1 became unresponsive with initial bradycardia followed by PEA for which she received atropine and 15 minutes of CPR.  The central venous line was determined to be in the right carotid on 7/1.  She therefore had a right common femoral trialysis catheter placed on 7/1 the right common carotid artery catheter was removed and had Perclose closure of the arteriotomy.  She has been minimally responsive since cardiac arrest.  However she has gradually regained eye opening, but her recovery has plateaued off  of all sedation since 7/4.    Neurology is asked to assist with prognostication.    She did briefly have atrial fibrillation on 7/5 after runs of V. tach, felt to be in the setting of critical illness and responsive to amiodarone   LKW: 6/29 Thrombolytic given?: No, cardiac arrest IA performed?: No, exam not consistent with LVO Premorbid modified rankin scale:      4 - Moderately severe disability. Unable to attend to own bodily needs without assistance, and unable to walk unassisted.  Mostly in a wheelchair secondary to deconditioning, pain, and cardiorespiratory comorbidities per 3/24 anesthesia note   ROS: Unable to obtain due to altered mental status.   Past Medical History:  Diagnosis Date   (HFpEF) heart failure with preserved ejection fraction (HCC)    a. 11/2014 Echo: EF 50-55%, Gr1 DD; b. 06/2016 Echo: EF 60-65%, no rwma, mod dil LA, nl RV, triv eff.   Anxiety    Asthma    Bronchitis    CHF (congestive heart failure) (HCC)    Chronic cough    Chronic respiratory failure with hypoxia and hypercapnia (HCC)    a. Home O2.  (2-4L)   CKD (chronic kidney disease), stage III (HCC)    Depression    Diabetes mellitus without complication (HCC)    Diverticulosis    Dyspnea    Dysrhythmia    per pt, no arrythmias   Environmental and seasonal allergies    Extreme obesity with alveolar hypoventilation (HCC)    Hypertension    Iron deficiency anemia    a. chronic blood loss->heavy menses.   Kidney failure    dialysis tues/thur/sat   ... left arm shunt   Orthopnea    Oxygen dependent  3l/min   Pericardial effusion    a. 11/2014 CT Chest: Mod effusion; b. 11/2014 Echo: small to mod circumferential effusion. No hemodynamic compromise.   Pneumonia    history   Poorly controlled diabetes mellitus (HCC)    a. 11/2014 A1c 13.2.   Swelling of both lower extremities    Past Surgical History:  Procedure Laterality Date   A/V FISTULAGRAM Left 06/09/2017   Procedure: A/V  FISTULAGRAM;  Surgeon: Annice Needy, MD;  Location: [facility];  Service: Cardiovascular;  Laterality: Left;   A/V FISTULAGRAM Left 06/16/2017   Procedure: A/V FISTULAGRAM;  Surgeon: Annice Needy, MD;  Location: [facility];  Service: Cardiovascular;  Laterality: Left;   A/V FISTULAGRAM Left 01/28/2018   Procedure: A/V FISTULAGRAM;  Surgeon: Annice Needy, MD;  Location: [facility];  Service: Cardiovascular;  Laterality: Left;   AMPUTATION TOE Right 10/21/2020   Procedure: AMPUTATION TOE-5th Digit Amputation Right Foot;  Surgeon: Felecia Shelling, DPM;  Location: [facility];  Service: Podiatry;  Laterality: Right;   AV FISTULA PLACEMENT Left 04/25/2017   Procedure: ARTERIOVENOUS (AV) FISTULA CREATION;  Surgeon: Annice Needy, MD;  Location: [facility];  Service: Vascular;  Laterality: Left;   BONE BIOPSY Right 04/27/2021   Procedure: Right 4th metatarsal BONE BIOPSY;  Surgeon: Candelaria Stagers, DPM;  Location: [facility];  Service: Podiatry;  Laterality: Right;   CATARACT EXTRACTION W/PHACO Left 01/21/2018   Procedure: CATARACT EXTRACTION PHACO AND INTRAOCULAR LENS PLACEMENT (IOC);  Surgeon: Lockie Mola, MD;  Location: [facility];  Service: Ophthalmology;  Laterality: Left;  [place] 01:55.5 CDE 9.05 EAUP 13.9 Fluid Pack Lot # [id number]   CENTRAL LINE INSERTION N/A 09/10/2022   Procedure: CENTRAL LINE INSERTION;  Surgeon: Renford Dills, MD;  Location: [facility];  Service: Cardiovascular;  Laterality: N/A;   CERVICAL CONIZATION W/BX N/A 08/01/2022   Procedure: CERVICAL BIOPSY;  Surgeon: Feliberto Gottron, Ihor Austin, MD;  Location: [facility];  Service: Gynecology;  Laterality: N/A;   CESAREAN SECTION     times 3   DIALYSIS/PERMA CATHETER INSERTION Right 04/04/2017   Procedure: DIALYSIS/PERMA CATHETER INSERTION;  Surgeon: Renford Dills, MD;  Location: [facility];  Service: Cardiovascular;  Laterality: Right;   DIALYSIS/PERMA CATHETER REMOVAL N/A 09/17/2017    Procedure: DIALYSIS/PERMA CATHETER REMOVAL;  Surgeon: Annice Needy, MD;  Location: [facility];  Service: Cardiovascular;  Laterality: N/A;   INCISION AND DRAINAGE Right 10/26/2020   Procedure: INCISION AND DRAINAGE;  Surgeon: Candelaria Stagers, DPM;  Location: [facility];  Service: Podiatry;  Laterality: Right;   INSERTION OF DIALYSIS CATHETER N/A 04/16/2017   Procedure: INSERTION OF DIALYSIS PERM CATHETER;  Surgeon: Annice Needy, MD;  Location: [facility];  Service: Vascular;  Laterality: N/A;   LOWER EXTREMITY ANGIOGRAPHY Right 10/18/2020   Procedure: Lower Extremity Angiography;  Surgeon: Annice Needy, MD;  Location: [facility];  Service: Cardiovascular;  Laterality: Right;   LOWER EXTREMITY ANGIOGRAPHY Right 04/17/2022   Procedure: Lower Extremity Angiography;  Surgeon: Annice Needy, MD;  Location: [facility];  Service: Cardiovascular;  Laterality: Right;     Family History  Problem Relation Age of Onset   Diabetes Mother    Hypertension Mother    Hypertension Father    Diabetes Father    Breast cancer Maternal Aunt 34   Breast cancer Maternal Aunt 50   Hypertension Other    Diabetes Other  Social History:  reports that she has never smoked. She has been exposed to tobacco smoke. She has never used smokeless tobacco. She reports that she does not currently use alcohol. She reports current drug use. Drug: Marijuana.   Exam: Current vital signs: BP (!) 96/13   Pulse 86   Temp 99.7 F (37.6 C)   Resp (!) 26   Ht 5' 4.5" (1.638 m)   Wt 128 kg   LMP 10/14/2012 (Approximate)   SpO2 93%   BMI 47.69 kg/m  Vital signs in last 24 hours: Temp:  [93.2 F (34 C)-99.7 F (37.6 C)] 99.7 F (37.6 C) (07/06 1200) Pulse Rate:  [80-106] 86 (07/06 1200) Resp:  [15-36] 26 (07/06 1200) BP: (81-96)/(13-43) 96/13 (07/06 1200) SpO2:  [90 %-100 %] 93 % (07/06 1200) Arterial Line BP: (98-129)/(42-64) 116/55 (07/06 1200) FiO2 (%):  [40 %-50 %] 40 % (07/06 1149) Weight:   [960 kg] 128 kg (07/06 0203)   Physical Exam  Constitutional: Appears chronically ill Psych: Minimally interactive Eyes: No scleral injection MSK: Right lower extremity bandaged, secondary to chronic wound Cardiovascular: Normal rate and regular rhythm on monitor at time of my examination Respiratory: Comfortable on the ventilator GI: Soft.  No distension.   Neuro: Mental Status: Opens eyes intermittently, sometimes to voice, sometimes to light touch, but not consistently Does not follow any commands Cranial Nerves: II: No blink to threat. Pupils are equal, round, and reactive to light, sluggish 5 mm to 3 mm bilaterally.   III,IV, VI/VIII: EOMI to VOR.  At times has spontaneous downgaze, or right gaze but does not clearly track or fixate V/VII: Facial sensation is symmetric to eyelash brush VIII: No response to voice X/XI: Intact cough/gag XII: Unable to assess tongue protrusion secondary to patient's mental status  Motor/Sensory: Tone is normal. Bulk is normal.  No movement to noxious stim in any of the extremities  Deep Tendon Reflexes: Absent throughout  Cerebellar: Unable to assess secondary to patient's mental status    NIHSS total 27  I have reviewed labs in epic and the results pertinent to this consultation are:  Basic Metabolic Panel: Recent Labs  Lab 08/27/22 0417 08/27/22 1523 08/28/22 0347 08/28/22 1532 08/29/22 0408 08/29/22 1645 08/30/22 0408 08/30/22 1535 08/31/22 0257  NA 129*   < > 129*   < > 127* 129* 132* 132* 136  K 4.2   < > 3.6   < > 4.0 4.1 3.8 4.2 4.0  CL 91*   < > 94*   < > 96* 98 101 101 99  CO2 18*   < > 20*   < > 18* 19* 21* 19* 23  GLUCOSE 238*   < > 297*   < > 298* 312* 313* 238* 190*  BUN 49*   < > 41*   < > 33* 39* 33* 32* 30*  CREATININE 4.40*   < > 3.16*   < > 2.17* 1.93* 1.68* 1.45* 1.33*  CALCIUM 8.5*   < > 8.7*   < > 8.3* 8.3* 8.3* 8.4* 8.6*  MG 2.2  --  2.1  --  2.3  --  2.5*  --  2.5*  PHOS 5.0*   < > 3.4   < > 2.6 2.1*  2.8 2.8 2.1*   < > = values in this interval not displayed.    CBC: Recent Labs  Lab 09/04/2022 1316 08/27/22 0417 08/28/22 0347 08/29/22 0408 08/30/22 0408 08/31/22 0257  WBC 24.7* 20.4* 34.5* 29.8* 29.9*  29.4*  NEUTROABS 21.6* 16.5* 30.3*  --   --   --   HGB 14.1 13.8 13.7 13.7 13.3 14.1  HCT 42.1 41.0 41.6 40.9 40.1 43.0  MCV 88.3 88.0 89.5 88.1 89.1 92.1  PLT 284 259 171 117* 104* 117*    Coagulation Studies: No results for input(s): "LABPROT", "INR" in the last 72 hours.   Lab Results  Component Value Date   CHOL 157 08/30/2022   HDL <10 (L) 08/30/2022   LDLCALC NOT CALCULATED 08/30/2022   TRIG 153 (H) 08/30/2022   TRIG 153 (H) 08/30/2022   CHOLHDL NOT CALCULATED 08/30/2022   Lab Results  Component Value Date   HGBA1C 7.5 (H) 08/30/2022     I have reviewed the images obtained:  ECHO 09/06/2022  1. Left ventricular ejection fraction, by estimation, is 55 to 60%. The  left ventricle has normal function. Left ventricular endocardial border  not optimally defined to evaluate regional wall motion. There is mild left  ventricular hypertrophy. Left  ventricular diastolic parameters are indeterminate.   2. Right ventricular systolic function is moderately reduced. The right  ventricular size is moderately enlarged. There is severely elevated  pulmonary artery systolic pressure.   3. Left atrial size was mildly dilated.   4. Right atrial size was severely dilated.   5. The mitral valve is abnormal. No evidence of mitral valve  regurgitation. Moderate to severe mitral stenosis. The mean mitral valve  gradient is 10.0 mmHg. Severe mitral annular calcification.   6. The aortic valve is calcified. Aortic valve regurgitation is not  visualized. No aortic stenosis is present by gradient but could be  under-estimated.   7. The inferior vena cava is dilated in size with <50% respiratory  variability, suggesting right atrial pressure of 15 mmHg.   8. Anterior mitral valve  leaflet is severely calcified and could be  cuasing some degree of LVOT obstruction but no significant gradient was  measured.   9. Challenging image quality.   MRI brain 08/27/2022 personally reviewed, agree with radiology:   1. Few scattered subcentimeter acute ischemic infarcts involving the bilateral cerebral and cerebellar hemispheres as above. Minimal associated petechial blood products at the right occipital lobe. No other associated hemorrhage or mass effect. Given the various vascular distributions involved, a central thromboembolic etiology is suspected. 2. Otherwise normal brain MRI for age.  EEG 08/28/2022  - Periodic discharges with triphasic morphology, generalized ( GPDs) - Continuous slow, generalized This study is suggestive of moderate to severe diffuse encephalopathy, nonspecific etiology but could be related to toxic-metabolic causes. No seizures or definite epileptiform discharges were seen throughout the recording.   Impression: Regarding prognosis, this patient has anoxic brain injury, presently in a vegetative state. In a [place] study evaluating awakening after out of hospital cardiac arrest, it was found that late awakening (after 72 hours) was not uncommon, but the probability of awakening decreased over time.  Additionally late awakening was associated with poor neurologic outcome (reference below).  Recent neurological guidelines recommend prognostication after 72 hours. Ms. Delprado is now 120 hours postevent, has been on CRRT for some time, with good improvement in many of her metabolic parameters.    At present time I would describe the patient's clinical status as a vegetative state.  Her most likely outcome will be persistent vegetative state.  Given her significant comorbidities of ESRD on dialysis, significant baseline respiratory failure, heart failure, pulmonary hypertension, concern for osteomyelitis of her foot etc., this does put her at  even higher risk of  further complications, which would further affect her neurological recovery and further reduces the chance of meaningful recovery  From a stroke perspective, the etiology of her punctate multifocal stroke is most likely her cardiac arrest, and the inadvertent access of her aortic arch.  However given her persistent leukocytosis without clear source at this time and foot ulcer, if aggressive goals of care are felt to be within her wishes, TEE for stroke workup to rule out endocarditis would be indicated.  Another potential etiology would be atrial fibrillation, but she does not have documented atrial fibrillation prior to her arrest, although she did have an episode of A-fib in hospital yesterday.  Because of the possibility of endocarditis, I would recommend not anticoagulating for possible atrial fibrillation or any other indication that is not immediately life-threatening (such as saddle pulmonary embolus), until endocarditis is ruled out  [email or web address]  Recommendations:  #Prognosis -Appreciate ongoing goals of care discussion by primary team with family -Most likely outcome is persistent vegetative state, though cannot fully rule out a small chance of recovery -Medical comorbidities are significant factor in long-term poor prognosis  #Multifocal strokes in multiple vascular territories, due to central embolic etiology -Agree with aspirin 81 mg as she has been tolerating this well -If family feels patient would want to continue with aggressive goals of care, TEE to rule out endocarditis -If aggressive goals of care are pursued, please avoid anticoagulation unless there is a life-threatening indication such as saddle PE, until TEE rules out endocarditis -Continue home atorvastatin 40 mg daily, LDL did not result, if within goals of care recheck LDL and increase statin if needed for goal LDL less than  70 -If within goals of care, MRA head to complete vessel imaging, carotid ultrasound when able (may will not be able to obtain at this time due to her access) -PT/OT/SLP when able  Brooke Dare MD-PhD [facility] [phone] [facility] coverage for [facility] is from 8 AM to 4 AM in-house and 4 PM to 8 PM by telephone/video. 8 PM to 8 AM emergent questions or overnight urgent questions should be addressed to Teleneurology On-call or [facility] neurohospitalist; contact information can be found on AMION   CRITICAL CARE Performed by: Gordy Councilman   Total critical care time: 45 minutes  Critical care time was exclusive of separately billable procedures and treating other patients.  Critical care was necessary to treat or prevent imminent or life-threatening deterioration.  Critical care was time spent personally by me on the following activities: development of treatment plan with patient and/or surrogate as well as nursing, discussions with consultants, evaluation of patient's response to treatment, examination of patient, obtaining history from patient or surrogate, ordering and performing treatments and interventions, ordering and review of laboratory studies, ordering and review of radiographic studies, pulse oximetry and re-evaluation of patient's condition.

## 2022-08-31 NOTE — Progress Notes (Signed)
NAME:  [name], MRN:  [id number], DOB:  1970/10/08, LOS: 13 ADMISSION DATE:  08/09/2022, CHIEF COMPLAINT:  Cardiac Arrest   History of Present Illness:  Patient is a 52 year old female with a past medical history of ESRD (on HD), COPD/Asthma, OSA/OHS, chronic hypoxic respiratory failure, HTN, HFpEF, T2DM, and morbid obesity who presented to the hospital with dysuria, now in the ICU with altered mental status complicated by cardiac arrest.   Patient initially presented to the hospital on 08/17/2022 with dysuria and admitted for the management of UTI. Cultures have grown yeast, and she's received IV antibiotics as well as anti-fungal therapy. During the course of her hospitalization she was evaluated by gynecology and underwent a cervical biopsy. [name] mental status was waxing and waning in the setting of ESRD, acidemia, and hypercapnia. She did not tolerate BiPAP nocturnally. Given worsening, patient was moved to the ICU for monitoring as well as for consideration of CRRT. She had a central line placed by vascular surgery at the bedside yesterday.    Overnight, patient was noted to be unresponsive with the development of bradycardia followed by PEA arrest. She was resuscitated after 15 minutes of CPR and this morning was noted to be on pressors.   Significant Hospital Events: Including procedures, antibiotic start and stop dates in addition to other pertinent events   08/25/2022: admitted to the ICU 09/11/2022: right CVC noted in arterial position, vascular surgery discontinued. Dialysis catheter placed. CRRT initiated. 08/27/2022: continued on CRRT with volume removal. CT head and MRI brain performed, no sign of hypoxic brain injury. 08/28/2022: continued on CRRT, pressor requirements down. EEG with diffuse slowing. Opens eyes but does not follow commands. Family meeting held, updated on progress 08/29/2022: continued on CRRT. Anisocoria noted in the afternoon, repeat head CT unchanged. Family  meeting held, family updated 08/30/2022: continued on CRRT, had to run even due to hypotension. Afib with RVR, switched to phenylephrine and started amiodarone, with improvement. Family meeting held, family updated, asked for transfer to [facility] called and they are at capacity and not accepting any transfers to MICU 08/31/2022: On CRRT, family meeting again planned for today.  Interim History / Subjective:  Opens eyes to verbal and tactile stimuli but doesn't follow commands.  Objective   Blood pressure (!) 81/31, pulse 80, temperature 99.3 F (37.4 C), resp. rate 15, height 5' 4.5" (1.638 m), weight 128 kg, last menstrual period 10/14/2012, SpO2 99 %.    Vent Mode: PRVC FiO2 (%):  [40 %-50 %] 40 % Set Rate:  [18 bmp] 18 bmp Vt Set:  [500 mL] 500 mL PEEP:  [8 cmH20] 8 cmH20 Plateau Pressure:  [23 cmH20-26 cmH20] 23 cmH20   Intake/Output Summary (Last 24 hours) at 08/31/2022 1106 Last data filed at 08/31/2022 1100 Gross per 24 hour  Intake 3173.63 ml  Output 912 ml  Net 2261.63 ml    Filed Weights   08/29/22 0300 08/30/22 0300 08/31/22 0203  Weight: 130.2 kg 127.9 kg 128 kg    Examination: Physical Exam Constitutional:      General: She is not in acute distress.    Appearance: She is obese. She is ill-appearing.  HENT:     Head: Normocephalic.  Cardiovascular:     Rate and Rhythm: Normal rate and regular rhythm.     Pulses: Normal pulses.     Heart sounds: Normal heart sounds.  Pulmonary:     Comments: Ventilated breath sounds anteriorly on auscultation Abdominal:  General: There is distension.     Palpations: Abdomen is soft.  Musculoskeletal:     Right lower leg: Edema present.     Left lower leg: Edema present.     Comments: Right foot diabetic ulcer  Neurological:     Mental Status: She is disoriented.     Comments: Pupils unequal and very sluggish to reach. Opens eyes but does not follow commands.     Assessment & Plan:   Neurology #Toxic Metabolic  Encephalopathy   This is in the setting of cardiac arrest, metabolic acidosis, toxic metabolite accumulation secondary to ESRD, as well as CO2 narcosis. Patient was intubated in the setting of her cardiac arrest, and was initiated on propofol for sedation with PRN's in place for analgesia. Propofol has been discontinued since Monday. CRRT initiated for clearance of toxic metabolites. CT head and MRI brain performed, no findings to suggest hypoxic injury but MRI does show scattered ischemic infarcts but this does not explain her encephalopathy. EEG shows diffuse slowing. Received one dose of Cefepime which was changed to Zosyn. Patient will likely need more time prior to making definitive prognostic assessment thou I am concerned that she's unlikely to make a meaningful neurological recovery given she's now 5 days out of her cardiac arrest on CRRT   -Maintain off sedation -monitor for CVA given CVC in the carotid -PRN ABG's -consider neurology consultation  Cardiovascular #Cardiac Arrest #Circulatory Shock #HFpEF #Group 2 Pulmonary Hypertension #Inadvertent CVC placement in the carotid #Afib with RVR  Cardiac arrest was likely secondary to CO2 narcosis and metabolic acidosis, preceded by bradycardia. Blood gas had shown acidosis peri-arrest. Rise in troponin likely secondary to chest compressions and ACLS, with post arrest EKG not suggestive of ischemia nor arrhythmia. Repeat echocardiogram showed dilated right ventricle, elevated PASP, and moderate to severe mitral stenosis similar to prior. She is on CRRT with overall negative balance with ultra-filtration.   Developed circulatory shock post cardiac arrest, with rise in pressors yesterday in the setting of afib with RVR. We started amiodarone, switched nor-epi to phenylephrine, and ran her overall positive yesterday with stabilization in hemodynamics. Will switch back to nor-epi today. Furthermore, patient had a central line placed in the carotid  artery that was removed with vascular surgery.   -continue vasopressor support with goal MAP 65 -trend troponin and lactic acid -wean stress dose steroids -check lipid panel   Pulmonary #Acute on Chronic Hypoxic Respiratory Failure #OSA/OHS #Asthma/COPD overlap   Intubated in the setting of hypoxic arrest; known history of severe OHS/OSA and asthma/COPD. Currently ventilated with blood gases showing metabolic acidosis. Will follow blood gas and adjust ventilator settings accordingly. Did have increased secretions from the ETT which were cultured and were overall negative.   -Full vent support, implement lung protective strategies -Plateau pressures less than 30 cm H20 -Wean FiO2 & PEEP as tolerated to maintain O2 sats >92% -Follow intermittent Chest X-ray & ABG as needed -Spontaneous Breathing Trials when respiratory parameters met and mental status permits -Implement VAP Bundle -Continue Bronchodilators   Gastrointestinal #Elevated liver enzymes   LFT's are chronically elevated. RUQ ultrasound on admission did not showing acute findings. Likely NAFLD. Patient also on PPI for SUP and tube feeds were initiated. was on fluconazole which could contribute to LFT elevation and was discontinued. Similarly switched from H2B to PPI. LFT's improved yesterday, will continue to trend.   -repeat RUQ U/S without signs of cholecystitis -avoid hepatotoxins   Renal #ESRD on HD #Anion Gap Metabolic Acidosis  History of ESRD on HD. Given hemodynamic instability, pressor use, and cardiac arrest, will did proceed with CRRT for management of acidosis and volume status. Patient will benefit from volume removal given elevated PASP and signs of RV pressure overload. Appreciate input from nephrology.   Endocrine #T2DM   ICU glycemic protocol. Check lipid panel. wean stress dose steroids.   Hem/Onc heparin subQ for DVT prophylaxis, monitor H/H and platelet counts   ID #UTI #Aspiration  Pneumonia #Sepsis   Patient treated for UTI with Ceftriaxone, which we did broaden, now on Zosyn and Vancomycin, pending respiratory culture results.   -continue vancomycin -d/c metronidazole -d/c doxycycline -switched Cefepime (received one dose) to Zosyn -discontinuation fluconazole with rising LFT's    Best Practice (right click and "Reselect all SmartList Selections" daily)   Diet/type: tubefeeds DVT prophylaxis: prophylactic heparin  GI prophylaxis: PPI Lines: Central line, Dialysis Catheter, Arterial Line, and yes and it is still needed Foley:  Yes, and it is still needed Code Status:  full code Last date of multidisciplinary goals of care discussion [08/31/2022]  Labs   CBC: Recent Labs  Lab 08/27/2022 1316 08/27/22 0417 08/28/22 0347 08/29/22 0408 08/30/22 0408 08/31/22 0257  WBC 24.7* 20.4* 34.5* 29.8* 29.9* 29.4*  NEUTROABS 21.6* 16.5* 30.3*  --   --   --   HGB 14.1 13.8 13.7 13.7 13.3 14.1  HCT 42.1 41.0 41.6 40.9 40.1 43.0  MCV 88.3 88.0 89.5 88.1 89.1 92.1  PLT 284 259 171 117* 104* 117*     Basic Metabolic Panel: Recent Labs  Lab 08/27/22 0417 08/27/22 1523 08/28/22 0347 08/28/22 1532 08/29/22 0408 08/29/22 1645 08/30/22 0408 08/30/22 1535 08/31/22 0257  NA 129*   < > 129*   < > 127* 129* 132* 132* 136  K 4.2   < > 3.6   < > 4.0 4.1 3.8 4.2 4.0  CL 91*   < > 94*   < > 96* 98 101 101 99  CO2 18*   < > 20*   < > 18* 19* 21* 19* 23  GLUCOSE 238*   < > 297*   < > 298* 312* 313* 238* 190*  BUN 49*   < > 41*   < > 33* 39* 33* 32* 30*  CREATININE 4.40*   < > 3.16*   < > 2.17* 1.93* 1.68* 1.45* 1.33*  CALCIUM 8.5*   < > 8.7*   < > 8.3* 8.3* 8.3* 8.4* 8.6*  MG 2.2  --  2.1  --  2.3  --  2.5*  --  2.5*  PHOS 5.0*   < > 3.4   < > 2.6 2.1* 2.8 2.8 2.1*   < > = values in this interval not displayed.    GFR: Estimated Creatinine Clearance: 66.9 mL/min (A) (by C-G formula based on SCr of 1.33 mg/dL (H)). Recent Labs  Lab 08/31/2022 0351 09/19/2022 0708  09/15/2022 1225 08/30/2022 1316 08/27/22 0417 08/27/22 0812 08/28/22 0347 08/29/22 0408 08/30/22 0408 08/31/22 0257  PROCALCITON  --   --   --  3.71  --   --   --   --   --   --   WBC 23.0*  --   --  24.7*   < >  --  34.5* 29.8* 29.9* 29.4*  LATICACIDVEN 6.9* 6.2* 5.2*  --   --  2.5*  --   --   --   --    < > = values in this interval not  displayed.     Liver Function Tests: Recent Labs  Lab 09/18/2022 0351 09/17/2022 1316 09/17/2022 1721 08/28/22 0347 08/28/22 1532 08/29/22 0408 08/29/22 1645 08/30/22 0407 08/30/22 0408 08/30/22 1535 08/31/22 0257  AST 263* 329*  --  653*  --  803*  --  388*  --   --   --   ALT 65* 73*  --  162*  --  247*  --  205*  --   --   --   ALKPHOS 570* 547*  --  602*  --  722*  --  689*  --   --   --   BILITOT 2.5* 2.5*  --  4.3*  --  4.3*  --  3.7*  --   --   --   PROT 6.5 6.5  --  6.5  --  6.5  --  6.2*  --   --   --   ALBUMIN 3.0* 2.9*   < > 2.8*  3.0*   < > 2.7*  2.7* 2.8* 2.5* 2.5* 2.5* 2.5*   < > = values in this interval not displayed.    No results for input(s): "LIPASE", "AMYLASE" in the last 168 hours. Recent Labs  Lab 08/31/22 0736  AMMONIA 54*    ABG    Component Value Date/Time   PHART 7.34 (L) 08/28/2022 0842   PCO2ART 37 08/28/2022 0842   PO2ART 90 08/28/2022 0842   HCO3 20.0 08/28/2022 0842   TCO2 23 10/26/2020 1619   ACIDBASEDEF 5.2 (H) 08/28/2022 0842   O2SAT 98.4 08/28/2022 0842     Coagulation Profile: No results for input(s): "INR", "PROTIME" in the last 168 hours.  Cardiac Enzymes: No results for input(s): "CKTOTAL", "CKMB", "CKMBINDEX", "TROPONINI" in the last 168 hours.  HbA1C: Hgb A1c MFr Bld  Date/Time Value Ref Range Status  08/30/2022 04:08 AM 7.5 (H) 4.8 - 5.6 % Final    Comment:    (NOTE) Pre diabetes:          5.7%-6.4%  Diabetes:              >6.4%  Glycemic control for   <7.0% adults with diabetes   08/06/2022 12:11 PM 7.8 (H) 4.8 - 5.6 % Final    Comment:    (NOTE) Pre diabetes:           5.7%-6.4%  Diabetes:              >6.4%  Glycemic control for   <7.0% adults with diabetes     CBG: Recent Labs  Lab 08/30/22 2012 08/30/22 2355 08/31/22 0348 08/31/22 0748 08/31/22 0751  GLUCAP 193* 215* 194* 194* 168*     Review of Systems:   Unable to obtain  Past Medical History:  She,  has a past medical history of (HFpEF) heart failure with preserved ejection fraction (HCC), Anxiety, Asthma, Bronchitis, CHF (congestive heart failure) (HCC), Chronic cough, Chronic respiratory failure with hypoxia and hypercapnia (HCC), CKD (chronic kidney disease), stage III (HCC), Depression, Diabetes mellitus without complication (HCC), Diverticulosis, Dyspnea, Dysrhythmia, Environmental and seasonal allergies, Extreme obesity with alveolar hypoventilation (HCC), Hypertension, Iron deficiency anemia, Kidney failure, Orthopnea, Oxygen dependent, Pericardial effusion, Pneumonia, Poorly controlled diabetes mellitus (HCC), and Swelling of both lower extremities.   Surgical History:   Past Surgical History:  Procedure Laterality Date   A/V FISTULAGRAM Left 06/09/2017   Procedure: A/V FISTULAGRAM;  Surgeon: Annice Needy, MD;  Location: [facility];  Service: Cardiovascular;  Laterality: Left;  A/V FISTULAGRAM Left 06/16/2017   Procedure: A/V FISTULAGRAM;  Surgeon: Annice Needy, MD;  Location: [facility];  Service: Cardiovascular;  Laterality: Left;   A/V FISTULAGRAM Left 01/28/2018   Procedure: A/V FISTULAGRAM;  Surgeon: Annice Needy, MD;  Location: [facility];  Service: Cardiovascular;  Laterality: Left;   AMPUTATION TOE Right 10/21/2020   Procedure: AMPUTATION TOE-5th Digit Amputation Right Foot;  Surgeon: Felecia Shelling, DPM;  Location: [facility];  Service: Podiatry;  Laterality: Right;   AV FISTULA PLACEMENT Left 04/25/2017   Procedure: ARTERIOVENOUS (AV) FISTULA CREATION;  Surgeon: Annice Needy, MD;  Location: [facility];  Service: Vascular;  Laterality: Left;    BONE BIOPSY Right 04/27/2021   Procedure: Right 4th metatarsal BONE BIOPSY;  Surgeon: Candelaria Stagers, DPM;  Location: [facility];  Service: Podiatry;  Laterality: Right;   CATARACT EXTRACTION W/PHACO Left 01/21/2018   Procedure: CATARACT EXTRACTION PHACO AND INTRAOCULAR LENS PLACEMENT (IOC);  Surgeon: Lockie Mola, MD;  Location: [facility];  Service: Ophthalmology;  Laterality: Left;  [place] 01:55.5 CDE 9.05 EAUP 13.9 Fluid Pack Lot # [id number]   CENTRAL LINE INSERTION N/A 09/18/2022   Procedure: CENTRAL LINE INSERTION;  Surgeon: Renford Dills, MD;  Location: [facility];  Service: Cardiovascular;  Laterality: N/A;   CERVICAL CONIZATION W/BX N/A 08/08/2022   Procedure: CERVICAL BIOPSY;  Surgeon: Feliberto Gottron, Ihor Austin, MD;  Location: [facility];  Service: Gynecology;  Laterality: N/A;   CESAREAN SECTION     times 3   DIALYSIS/PERMA CATHETER INSERTION Right 04/04/2017   Procedure: DIALYSIS/PERMA CATHETER INSERTION;  Surgeon: Renford Dills, MD;  Location: [facility];  Service: Cardiovascular;  Laterality: Right;   DIALYSIS/PERMA CATHETER REMOVAL N/A 09/17/2017   Procedure: DIALYSIS/PERMA CATHETER REMOVAL;  Surgeon: Annice Needy, MD;  Location: [facility];  Service: Cardiovascular;  Laterality: N/A;   INCISION AND DRAINAGE Right 10/26/2020   Procedure: INCISION AND DRAINAGE;  Surgeon: Candelaria Stagers, DPM;  Location: [facility];  Service: Podiatry;  Laterality: Right;   INSERTION OF DIALYSIS CATHETER N/A 04/16/2017   Procedure: INSERTION OF DIALYSIS PERM CATHETER;  Surgeon: Annice Needy, MD;  Location: [facility];  Service: Vascular;  Laterality: N/A;   LOWER EXTREMITY ANGIOGRAPHY Right 10/18/2020   Procedure: Lower Extremity Angiography;  Surgeon: Annice Needy, MD;  Location: [facility];  Service: Cardiovascular;  Laterality: Right;   LOWER EXTREMITY ANGIOGRAPHY Right 04/17/2022   Procedure: Lower Extremity Angiography;  Surgeon: Annice Needy, MD;  Location: [facility];  Service: Cardiovascular;  Laterality: Right;     Social History:   reports that she has never smoked. She has been exposed to tobacco smoke. She has never used smokeless tobacco. She reports that she does not currently use alcohol. She reports current drug use. Drug: Marijuana.   Family History:  Her family history includes Breast cancer (age of onset: 75) in her maternal aunt and maternal aunt; Diabetes in her father, mother, and another family member; Hypertension in her father, mother, and another family member.   Allergies Allergies  Allergen Reactions   Dust Mite Extract Shortness Of Breath and Itching   Mold Extract [Trichophyton] Shortness Of Breath and Itching   Pollen Extract Shortness Of Breath    Itching, shortness of breath, asthma like symptoms   Ozempic (0.25 Or 0.5 Mg-Dose) [Semaglutide(0.25 Or 0.5mg -Dos)] Other (See Comments)    pancreatitis   Sulfa Antibiotics Itching   Feraheme [  Ferumoxytol] Itching    Uses benadryl so she can get the medicine     Home Medications  Prior to Admission medications   Medication Sig Start Date End Date Taking? Authorizing Provider  acetaminophen (TYLENOL) 500 MG tablet Take 1,000 mg by mouth every 6 (six) hours as needed for mild pain, fever or headache.   Yes [provider]  albuterol (PROVENTIL) (2.5 MG/3ML) 0.083% nebulizer solution Take 3 mLs (2.5 mg total) by nebulization every 6 (six) hours as needed for wheezing or shortness of breath. 02/15/22  Yes Salena Saner, MD  albuterol (VENTOLIN HFA) 108 (90 Base) MCG/ACT inhaler Inhale 2 puffs into the lungs every 6 (six) hours as needed for wheezing or shortness of breath. 02/15/22  Yes Salena Saner, MD  atorvastatin (LIPITOR) 40 MG tablet Take 1 tablet (40 mg total) by mouth daily. 04/20/22  Yes Regalado, Belkys A, MD  benzonatate (TESSALON PERLES) 100 MG capsule Take 1 capsule (100 mg total) by mouth 3 (three) times daily as needed for cough.  05/22/22 05/22/23 Yes Boswell, Chelsa, NP  budesonide (PULMICORT) 0.5 MG/2ML nebulizer solution USE 1 VIAL  IN  NEBULIZER TWICE  DAILY - (Rinse Mouth After Each Treatment) 08/13/22  Yes Orson Eva, NP  ciprofloxacin (CIPRO) 500 MG tablet Take 500 mg by mouth daily. 08/13/22  Yes [provider]  clindamycin (CLEOCIN T) 1 % lotion APPLY TO FACE, ARMS AND LEGS EVENLY NIGHTLY AT BEDTIME 06/27/22  Yes Boswell, Chelsa, NP  escitalopram (LEXAPRO) 10 MG tablet Take 1 tablet (10 mg total) by mouth daily. 04/24/22 04/24/23 Yes Boswell, Gareth Morgan, NP  formoterol (PERFOROMIST) 20 MCG/2ML nebulizer solution USE 1 VIAL  IN  NEBULIZER TWICE  DAILY - (Morning And Evening) 08/13/22  Yes Boswell, Chelsa, NP  gentamicin cream (GARAMYCIN) 0.1 % Apply 1 Application topically at bedtime. 06/27/22  Yes [provider]  insulin degludec (TRESIBA) 100 UNIT/ML FlexTouch Pen Inject 20 Units into the skin daily. 04/20/22  Yes Regalado, Belkys A, MD  lidocaine-prilocaine (EMLA) cream Apply topically as needed (For access for dialysis).   Yes [provider]  LINZESS 290 MCG CAPS capsule TAKE 1 CAPSULE BY MOUTH IN THE MORNING WITH A GLASS OF WATER 30 MINUTES BEFORE BREAKFAST 08/08/22  Yes Miki Kins, FNP  methocarbamol (ROBAXIN) 750 MG tablet Take 750 mg by mouth every 8 (eight) hours as needed for muscle spasms.   Yes [provider]  midodrine (PROAMATINE) 10 MG tablet Take 20 mg by mouth daily as needed (for low blood pressure on dialysis days). 08/23/21  Yes [provider]  ondansetron (ZOFRAN) 4 MG tablet Take 4 mg by mouth 2 (two) times daily as needed for nausea. 07/02/22  Yes [provider]  pregabalin (LYRICA) 50 MG capsule TAKE 1 CAPSULE BY MOUTH EVERY 8 HOURS AS NEEDED FOR  NEUROPATHY 07/01/22  Yes Orson Eva, NP  revefenacin (YUPELRI) 175 MCG/3ML nebulizer solution  01/19/21  Yes [provider]  sevelamer (RENAGEL) 800 MG tablet Take 800-2,400 mg by mouth 4  (four) times daily. Three tablets three times daily with meals and 1 tablet with snacks once daily   Yes [provider]  torsemide (DEMADEX) 100 MG tablet Take 100 mg by mouth daily. 05/21/22  Yes [provider]  aspirin EC 81 MG tablet Take 81 mg by mouth daily.  Patient not taking: Reported on 08/19/2022    [provider]  cloNIDine (CATAPRES) 0.1 MG tablet Take 0.1 mg by mouth 2 (two)  times daily. Patient not taking: Reported on 08/19/2022 03/21/22   [provider]  doxepin (SINEQUAN) 10 MG capsule Take 1 capsule (10 mg total) by mouth at bedtime. Patient not taking: Reported on 08/19/2022 04/24/22   Orson Eva, NP  ferric citrate (AURYXIA) 1 GM 210 MG(Fe) tablet Take 210 mg by mouth 3 (three) times daily with meals. Only taking once a day most days Patient not taking: Reported on 08/19/2022    [provider]  insulin lispro (HUMALOG) 100 UNIT/ML KwikPen Inject into the skin. Patient not taking: Reported on 08/19/2022 06/12/21   [provider]  lactulose (CHRONULAC) 10 GM/15ML solution Take 30 mLs (20 g total) by mouth daily as needed for severe constipation. Patient not taking: Reported on 08/19/2022 04/26/17   Katha Hamming, MD  montelukast (SINGULAIR) 10 MG tablet Take 1 tablet (10 mg total) by mouth at bedtime. Patient not taking: Reported on 08/19/2022 09/17/21   Salena Saner, MD  Omeprazole Magnesium (PRILOSEC PO)     [provider]  OXYGEN Inhale 2-4 L into the lungs.    [provider]  predniSONE (DELTASONE) 50 MG tablet Take 1 tablet by mouth daily in AM with food x 5 days Patient not taking: Reported on 08/19/2022 04/24/22   Orson Eva, NP  RENVELA 2.4 g PACK Take 2.4 g by mouth 3 (three) times daily. Patient not taking: Reported on 08/19/2022 08/07/21   [provider]  sucroferric oxyhydroxide (VELPHORO) 500 MG chewable tablet Chew 500 mg by mouth 3 (three) times daily with meals.     [provider]  Vitamin D, Cholecalciferol, 25 MCG (1000 UT) TABS Take 1,000 Units by mouth daily. Patient not taking: Reported on 08/19/2022    [provider]     Critical care time: 40 minutes     Raechel Chute, MD [facility] 08/31/2022 11:06 AM

## 2022-08-31 NOTE — Progress Notes (Signed)
Messaged Nephrology to clarify order on how much fluid to remove on CRRT.  Nephrology want to pull even. Adjustments made to CRRT.

## 2022-09-01 DIAGNOSIS — G9341 Metabolic encephalopathy: Secondary | ICD-10-CM | POA: Diagnosis not present

## 2022-09-01 DIAGNOSIS — E119 Type 2 diabetes mellitus without complications: Secondary | ICD-10-CM | POA: Diagnosis not present

## 2022-09-01 DIAGNOSIS — J9621 Acute and chronic respiratory failure with hypoxia: Secondary | ICD-10-CM | POA: Diagnosis not present

## 2022-09-01 DIAGNOSIS — N186 End stage renal disease: Secondary | ICD-10-CM | POA: Diagnosis not present

## 2022-09-01 LAB — MAGNESIUM: Magnesium: 2.4 mg/dL (ref 1.7–2.4)

## 2022-09-01 LAB — RENAL FUNCTION PANEL
Albumin: 2.5 g/dL — ABNORMAL LOW (ref 3.5–5.0)
Anion gap: 13 (ref 5–15)
BUN: 34 mg/dL — ABNORMAL HIGH (ref 6–20)
CO2: 19 mmol/L — ABNORMAL LOW (ref 22–32)
Calcium: 8.2 mg/dL — ABNORMAL LOW (ref 8.9–10.3)
Chloride: 99 mmol/L (ref 98–111)
Creatinine, Ser: 1.44 mg/dL — ABNORMAL HIGH (ref 0.44–1.00)
GFR, Estimated: 44 mL/min — ABNORMAL LOW (ref >60)
Glucose, Bld: 205 mg/dL — ABNORMAL HIGH (ref 70–99)
Phosphorus: 2.4 mg/dL — ABNORMAL LOW (ref 2.5–4.6)
Potassium: 3.9 mmol/L (ref 3.5–5.1)
Sodium: 131 mmol/L — ABNORMAL LOW (ref 135–145)

## 2022-09-01 LAB — CBC
HCT: 41.9 % (ref 36.0–46.0)
Hemoglobin: 13.3 g/dL (ref 12.0–15.0)
MCH: 29.7 pg (ref 26.0–34.0)
MCHC: 31.7 g/dL (ref 30.0–36.0)
MCV: 93.5 fL (ref 80.0–100.0)
Platelets: 99 10*3/uL — ABNORMAL LOW (ref 150–400)
RBC: 4.48 MIL/uL (ref 3.87–5.11)
RDW: 21.7 % — ABNORMAL HIGH (ref 11.5–15.5)
WBC: 26.7 10*3/uL — ABNORMAL HIGH (ref 4.0–10.5)
nRBC: 4.5 % — ABNORMAL HIGH (ref 0.0–0.2)

## 2022-09-01 LAB — GLUCOSE, CAPILLARY
Glucose-Capillary: 223 mg/dL — ABNORMAL HIGH (ref 70–99)
Glucose-Capillary: 212 mg/dL — ABNORMAL HIGH (ref 70–99)
Glucose-Capillary: 197 mg/dL — ABNORMAL HIGH (ref 70–99)

## 2022-09-01 MED ORDER — GLYCOPYRROLATE 0.2 MG/ML IJ SOLN
0.2000 mg | INTRAMUSCULAR | Status: DC | PRN
  Administered 2022-09-01: 0.2 mg via INTRAVENOUS
  Filled 2022-09-01: qty 1

## 2022-09-01 MED ORDER — FENTANYL 2500MCG IN NS 250ML (10MCG/ML) PREMIX INFUSION
INTRAVENOUS | Status: AC
  Administered 2022-09-01: 50 ug/h via INTRAVENOUS
  Filled 2022-09-01: qty 250

## 2022-09-01 MED ORDER — K PHOS MONO-SOD PHOS DI & MONO 155-852-130 MG PO TABS
500.0000 mg | ORAL_TABLET | Freq: Once | ORAL | Status: AC
  Administered 2022-09-01: 500 mg
  Filled 2022-09-01: qty 2

## 2022-09-01 MED ORDER — CHLORHEXIDINE GLUCONATE CLOTH 2 % EX PADS: 6.0000 | MEDICATED_PAD | Freq: Every day | CUTANEOUS | Status: DC

## 2022-09-01 MED ORDER — SODIUM CHLORIDE 0.9 % IV SOLN: INTRAVENOUS | Status: DC

## 2022-09-01 MED ORDER — ONDANSETRON HCL 4 MG/2ML IJ SOLN: 4.0000 mg | Freq: Four times a day (QID) | INTRAMUSCULAR | Status: DC | PRN

## 2022-09-01 MED ORDER — LORAZEPAM 2 MG/ML IJ SOLN
2.0000 mg | INTRAMUSCULAR | Status: DC | PRN
  Administered 2022-09-01: 2 mg via INTRAVENOUS
  Filled 2022-09-01: qty 1

## 2022-09-01 MED ORDER — MORPHINE SULFATE (PF) 2 MG/ML IV SOLN
INTRAVENOUS | Status: AC
  Filled 2022-09-01: qty 2

## 2022-09-01 MED ORDER — ACETAMINOPHEN 650 MG RE SUPP: 650.0000 mg | Freq: Four times a day (QID) | RECTAL | Status: DC | PRN

## 2022-09-01 MED ORDER — FENTANYL 2500MCG IN NS 250ML (10MCG/ML) PREMIX INFUSION: 0.0000 ug/h | INTRAVENOUS | Status: DC

## 2022-09-01 MED ORDER — MORPHINE SULFATE (PF) 2 MG/ML IV SOLN
2.0000 mg | INTRAVENOUS | Status: DC | PRN
  Administered 2022-09-01: 4 mg via INTRAVENOUS

## 2022-09-01 MED ORDER — POLYVINYL ALCOHOL 1.4 % OP SOLN: 1.0000 [drp] | Freq: Four times a day (QID) | OPHTHALMIC | Status: DC | PRN

## 2022-09-02 MED FILL — Sodium Chloride IV Soln 0.9%: INTRAVENOUS | Qty: 250 | Status: AC

## 2022-09-02 MED FILL — Fentanyl Citrate Preservative Free (PF) Inj 2500 MCG/50ML: INTRAMUSCULAR | Qty: 50 | Status: AC

## 2022-09-10 DIAGNOSIS — M62830 Muscle spasm of back: Secondary | ICD-10-CM | POA: Diagnosis not present

## 2022-09-10 DIAGNOSIS — G894 Chronic pain syndrome: Secondary | ICD-10-CM | POA: Diagnosis not present

## 2022-09-26 NOTE — Progress Notes (Signed)
[facility]  ROUNDING NOTE   Subjective:   [name] is a 52 year old African-American female with a past medical history of end-stage renal disease, patient is on Tuesday Thursday Saturday schedule, diabetes mellitus, history of pancreatitis who came to the ER with chief complaint of abdominal pain with foul smelling urine for 2 weeks.  Patient is known to our clinic and receives outpatient dialysis treatments at [facility] on a TTS schedule  Update:  It appears family is moving towards comfort care. No significant change in neurologic status. Case discussed with nursing. We will hold CRRT at this time.  Objective:  Vital signs in last 24 hours:  Temp:  [97 F (36.1 C)-100.6 F (38.1 C)] 97.7 F (36.5 C) (07/07 0930) Pulse Rate:  [80-97] 82 (07/07 0930) Resp:  [15-31] 29 (07/07 0930) BP: (81-125)/(13-111) 125/111 (07/07 0805) SpO2:  [86 %-100 %] 98 % (07/07 0930) Arterial Line BP: (102-131)/(52-69) 111/54 (07/07 0930) FiO2 (%):  [40 %] 40 % (07/07 0811) Weight:  [129.5 kg] 129.5 kg (07/07 0500)  Weight change: 1.5 kg Filed Weights   08/30/22 0300 08/31/22 0203 09/23/2022 0500  Weight: 127.9 kg 128 kg 129.5 kg    Intake/Output: I/O last 3 completed shifts: In: 5053.2 [I.V.:1863.4; NG/GT:2590; IV Piggyback:599.8] Out: 3592 [Stool:720]   Intake/Output this shift:  Total I/O In: 338.5 [I.V.:88.5; NG/GT:250] Out: 197 [Stool:65]  Physical Exam: General: Ill appearing  Head: Normocephalic, atraumatic.  Eyes: Anicteric  Lungs:  Vent  Heart: Regular rate and rhythm  Abdomen:  Soft, nontender, obese  Extremities: No peripheral edema.  Neurologic: Intubated  Skin: Chronic right foot wound- bandaged, prurigo nodules  Access: Lt AVF (no bruit/thrill)    Basic Metabolic Panel: Recent Labs  Lab 08/28/22 0347 08/28/22 1532 08/29/22 0408 08/29/22 1645 08/30/22 0408 08/30/22 1535 08/31/22 0257 08/31/22 1600 08/31/2022 0421  NA 129*   < > 127*    < > 132* 132* 136 131* 131*  K 3.6   < > 4.0   < > 3.8 4.2 4.0 3.7 3.9  CL 94*   < > 96*   < > 101 101 99 102 99  CO2 20*   < > 18*   < > 21* 19* 23 19* 19*  GLUCOSE 297*   < > 298*   < > 313* 238* 190* 221* 205*  BUN 41*   < > 33*   < > 33* 32* 30* 39* 34*  CREATININE 3.16*   < > 2.17*   < > 1.68* 1.45* 1.33* 1.53* 1.44*  CALCIUM 8.7*   < > 8.3*   < > 8.3* 8.4* 8.6* 7.6* 8.2*  MG 2.1  --  2.3  --  2.5*  --  2.5*  --  2.4  PHOS 3.4   < > 2.6   < > 2.8 2.8 2.1* 2.5 2.4*   < > = values in this interval not displayed.     Liver Function Tests: Recent Labs  Lab 09/24/2022 0351 09/06/2022 1316 09/10/2022 1721 08/28/22 0347 08/28/22 1532 08/29/22 0408 08/29/22 1645 08/30/22 0407 08/30/22 0408 08/30/22 1535 08/31/22 0257 08/31/22 1600 09/10/2022 0421  AST 263* 329*  --  653*  --  803*  --  388*  --   --   --   --   --   ALT 65* 73*  --  162*  --  247*  --  205*  --   --   --   --   --  ALKPHOS 570* 547*  --  602*  --  722*  --  689*  --   --   --   --   --   BILITOT 2.5* 2.5*  --  4.3*  --  4.3*  --  3.7*  --   --   --   --   --   PROT 6.5 6.5  --  6.5  --  6.5  --  6.2*  --   --   --   --   --   ALBUMIN 3.0* 2.9*   < > 2.8*  3.0*   < > 2.7*  2.7*   < > 2.5* 2.5* 2.5* 2.5* 2.4* 2.5*   < > = values in this interval not displayed.    No results for input(s): "LIPASE", "AMYLASE" in the last 168 hours. Recent Labs  Lab 08/31/22 0736  AMMONIA 54*     CBC: Recent Labs  Lab 09/21/2022 1316 08/27/22 0417 08/28/22 0347 08/29/22 0408 08/30/22 0408 08/31/22 0257 08/26/2022 0421  WBC 24.7* 20.4* 34.5* 29.8* 29.9* 29.4* 26.7*  NEUTROABS 21.6* 16.5* 30.3*  --   --   --   --   HGB 14.1 13.8 13.7 13.7 13.3 14.1 13.3  HCT 42.1 41.0 41.6 40.9 40.1 43.0 41.9  MCV 88.3 88.0 89.5 88.1 89.1 92.1 93.5  PLT 284 259 171 117* 104* 117* 99*     Cardiac Enzymes: No results for input(s): "CKTOTAL", "CKMB", "CKMBINDEX", "TROPONINI" in the last 168 hours.  BNP: Invalid input(s):  "POCBNP"  CBG: Recent Labs  Lab 08/31/22 1606 08/31/22 1922 08/31/22 2333 09/11/2022 0425 09/16/2022 0802  GLUCAP 214* 202* 197* 223* 212*     Microbiology: Results for orders placed or performed during the hospital encounter of 08/14/2022  Urine Culture     Status: Abnormal   Collection Time: 07/31/2022  3:07 PM   Specimen: Urine, Clean Catch  Result Value Ref Range Status   Specimen Description   Final    URINE, CLEAN CATCH Performed at [facility], [address] [place], [place] [address]    Special Requests   Final    NONE Performed at [facility], [address] [place], [place] [address]    Culture >=100,000 COLONIES/mL YEAST (A)  Final   Report Status 08/20/2022 FINAL  Final  Wet prep, genital     Status: Abnormal   Collection Time: 08/20/22  6:35 PM  Result Value Ref Range Status   Yeast Wet Prep HPF POC PRESENT (A) NONE SEEN Corrected    Comment: CORRECTED RESULTS CALLED TO: JEFF STRENK 08/22/22 1412 SH CORRECTED ON 06/27 AT 1406: PREVIOUSLY REPORTED AS NONE SEEN    Trich, Wet Prep NONE SEEN NONE SEEN Final   Clue Cells Wet Prep HPF POC NONE SEEN NONE SEEN Final   WBC, Wet Prep HPF POC >=10 (A) <10 Final   Sperm NONE SEEN  Final    Comment: Performed at [facility], [address] [place], [place] [address]  Chlamydia/NGC rt PCR [facility] only)     Status: None   Collection Time: 08/20/22  6:35 PM   Specimen: Cervical/Vaginal swab; Genital  Result Value Ref Range Status   Specimen source GC/Chlam ENDOCERVICAL  Final   Chlamydia Tr NOT DETECTED NOT DETECTED Final   N gonorrhoeae NOT DETECTED NOT DETECTED Final    Comment: (NOTE) This CT/NG assay has not been evaluated in patients with a history of  hysterectomy. Performed at [facility],  [address] [place], [place] [address]   Culture, blood (Routine X 2) w Reflex to ID Panel     Status: None   Collection Time: 08/25/22 12:59 PM   Specimen: BLOOD  Result Value  Ref Range Status   Specimen Description BLOOD BLOOD RIGHT ARM RAC  Final   Special Requests   Final    BOTTLES DRAWN AEROBIC AND ANAEROBIC Blood Culture adequate volume   Culture   Final    NO GROWTH 5 DAYS Performed at [facility], [address], [place], [place] [address]    Report Status 08/30/2022 FINAL  Final  MRSA Next Gen by PCR, Nasal     Status: None   Collection Time: 08/25/22  2:01 PM   Specimen: Nasal Mucosa; Nasal Swab  Result Value Ref Range Status   MRSA by PCR Next Gen NOT DETECTED NOT DETECTED Final    Comment: (NOTE) The GeneXpert MRSA Assay (FDA approved for NASAL specimens only), is one component of a comprehensive MRSA colonization surveillance program. It is not intended to diagnose MRSA infection nor to guide or monitor treatment for MRSA infections. Test performance is not FDA approved in patients less than 47 years old. Performed at [facility], [address] [place], [place] [address]   Culture, blood (Routine X 2) w Reflex to ID Panel     Status: None   Collection Time: 08/25/22  3:59 PM   Specimen: BLOOD  Result Value Ref Range Status   Specimen Description BLOOD PICC LINE  Final   Special Requests   Final    BOTTLES DRAWN AEROBIC AND ANAEROBIC Blood Culture adequate volume   Culture   Final    NO GROWTH 5 DAYS Performed at [facility], [address], [place], [place] [address]    Report Status 08/30/2022 FINAL  Final  Culture, Respiratory w Gram Stain     Status: None   Collection Time: 09/10/2022  1:28 PM   Specimen: Tracheal Aspirate; Respiratory  Result Value Ref Range Status   Specimen Description   Final    TRACHEAL ASPIRATE Performed at [facility], [address] [place], [place] [address]    Special Requests   Final    NONE Performed at [facility], [address] [place], [place] [address]    Gram Stain   Final    ABUNDANT WBC PRESENT, PREDOMINANTLY PMN RARE BUDDING YEAST  SEEN RARE GRAM POSITIVE COCCI IN PAIRS    Culture   Final    FEW Consistent with normal respiratory flora. Performed at [facility], [address], [place], [place] [address]    Report Status 08/28/2022 FINAL  Final  MRSA Next Gen by PCR, Nasal     Status: None   Collection Time: 09/02/2022  5:21 PM   Specimen: Nasal Mucosa; Nasal Swab  Result Value Ref Range Status   MRSA by PCR Next Gen NOT DETECTED NOT DETECTED Final    Comment: (NOTE) The GeneXpert MRSA Assay (FDA approved for NASAL specimens only), is one component of a comprehensive MRSA colonization surveillance program. It is not intended to diagnose MRSA infection nor to guide or monitor treatment for MRSA infections. Test performance is not FDA approved in patients less than 64 years old. Performed at [facility], [address] [place], [place] [address]   Culture, Respiratory w Gram Stain     Status: None   Collection Time: 08/27/22  7:30 PM   Specimen:  Tracheal Aspirate; Respiratory  Result Value Ref Range Status   Specimen Description   Final    TRACHEAL ASPIRATE Performed at [facility], [address] [place], [place] [address]    Special Requests   Final    NONE Performed at [facility], [address] [place], [place] [address]    Gram Stain   Final    ABUNDANT WBC PRESENT, PREDOMINANTLY PMN RARE GRAM POSITIVE COCCI IN PAIRS    Culture   Final    RARE Normal respiratory flora-no Staph aureus or Pseudomonas seen Performed at [facility], [address] [place], [place] [address]    Report Status 08/30/2022 FINAL  Final    Coagulation Studies: No results for input(s): "LABPROT", "INR" in the last 72 hours.  Urinalysis: No results for input(s): "COLORURINE", "LABSPEC", "PHURINE", "GLUCOSEU", "HGBUR", "BILIRUBINUR", "KETONESUR", "PROTEINUR", "UROBILINOGEN", "NITRITE", "LEUKOCYTESUR" in the last 72 hours.  Invalid input(s): "APPERANCEUR"      Imaging: No results found.   Medications:     prismasol BGK 4/2.5 300 mL/hr at 08/31/22 1639    prismasol BGK 4/2.5 300 mL/hr at 08/31/22 1639   sodium chloride 5 mL/hr at 09/20/2022 0930   amiodarone 30 mg/hr (08/30/2022 0930)   anticoagulant sodium citrate     feeding supplement (VITAL AF 1.2 CAL) 60 mL/hr at 09/03/2022 0930   norepinephrine (LEVOPHED) Adult infusion 2 mcg/min (09/20/2022 0930)   phenylephrine (NEO-SYNEPHRINE) Adult infusion Stopped (08/31/22 1155)   piperacillin-tazobactam (ZOSYN)  IV Stopped (09/20/2022 0544)   prismasol BGK 4/2.5 2,000 mL/hr at 09/14/2022 0449   propofol (DIPRIVAN) infusion Stopped (08/28/22 2956)   vancomycin Stopped (08/31/22 1551)   vasopressin Stopped (08/29/2022 0917)    arformoterol  15 mcg Nebulization Q12H   aspirin  81 mg Per Tube Daily   budesonide  0.5 mg Nebulization BID   Chlorhexidine Gluconate Cloth  6 each Topical QHS   cholecalciferol  1,000 Units Per Tube Daily   free water  30 mL Per Tube Q4H   heparin injection (subcutaneous)  5,000 Units Subcutaneous Q12H   hydrocortisone sod succinate (SOLU-CORTEF) inj  50 mg Intravenous Q12H   insulin aspart  0-20 Units Subcutaneous Q4H   insulin aspart  7 Units Subcutaneous Q4H   insulin detemir  10 Units Subcutaneous BID   montelukast  10 mg Per Tube QHS   multivitamin  1 tablet Per Tube QHS   nutrition supplement (JUVEN)  1 packet Per Tube BID BM   mouth rinse  15 mL Mouth Rinse Q2H   pantoprazole (PROTONIX) IV  40 mg Intravenous Q24H   sodium chloride, albuterol, anticoagulant sodium citrate, bisacodyl, docusate, heparin, heparin, lidocaine (PF), lidocaine-prilocaine, naLOXone (NARCAN)  injection, mouth rinse, pentafluoroprop-tetrafluoroeth, polyethylene glycol, polyvinyl alcohol, sodium chloride  Assessment/ Plan:  [name] is a 52 y.o.  female with a past medical history of end-stage renal disease, patient is on Tuesday Thursday Saturday schedule, diabetes mellitus, history  of pancreatitis who came to the ER with chief complaint of abdominal pain with foul smelling urine for 2 weeks.  [facility] TTS Left AVF 127.5kg   End stage renal disease on hemodialysis. CRRT initiated on 08/26/22.  CRRT remains in place, UF temporarily decreased due to tachycardia and hypotension.   -It appears family moving towards comfort care.  Therefore we will hold off on further CRRT at this time.   2. Anemia of CKD.  Lab Results  Component Value Date   HGB 13.3 09/13/2022  Hgb acceptable.  No need for ESA  3. Secondary Hyperparathyroidism: with outpatient labs: PTH 2361, phosphorus 9.0, calcium 8.4 on 08/06/22.   Lab Results  Component Value Date   PTH 134 (H) 04/21/2017   CALCIUM 8.2 (L) 09/10/2022   CAION 0.81 (LL) 10/26/2020   PHOS 2.4 (L) 09/05/2022  Currently off Renvela.  4. Diabetes mellitus type II with chronic kidney disease/renal manifestations: noninsulin dependent. Most recent hemoglobin A1c is 7.8 on 07/28/2022.   Glycemic control as per primary team.  5.  Acute respiratory failure Family moving towards comfort care.  Possible terminal extubation today.  6.  Hypotension Requiring pressors-vasopressin,amio and neo.  7.  Altered mental status Off sedation now MRI of the brain shows few scattered subcentimeter's acute ischemic infarcts involving bilateral cerebral and cerebellar hemispheres.  Central thromboembolic etiology is suspected.   LOS: 14 [name] 7/7/20249:36 AM

## 2022-09-26 NOTE — Progress Notes (Signed)
PHARMACY CONSULT NOTE  Pharmacy Consult for Electrolyte Monitoring and Replacement   Recent Labs: Potassium (mmol/L)  Date Value  09/21/2022 3.9  11/21/2013 4.1   Magnesium (mg/dL)  Date Value  16/11/9602 2.4   Calcium (mg/dL)  Date Value  54/10/8117 8.2 (L)   Calcium, Total (mg/dL)  Date Value  14/78/2956 8.7   Albumin (g/dL)  Date Value  21/30/8657 2.5 (L)   Phosphorus (mg/dL)  Date Value  84/69/6295 2.4 (L)   Sodium (mmol/L)  Date Value  08/31/2022 131 (L)  11/21/2013 135 (L)    Assessment: 52 y/o female with h/o uterine fibroid, IDA, anxiety, COPD, asthma, OSA, CHF, ESRD on HD, PVD with chronic foot wound, depression, GERD, marijuana use, pulmonary hypertension and cervical mass who is admitted with UTI and encephalopathy complicated by respiaratory failure and cardiac arrest requiring intubation 7/1 now on CRRT  Goal of Therapy:  Electrolytes within normal limits  Plan:  --Na 131, stable --Phos 2.4, K Phos Neutral 500 mg per tube x 1 dose --Follow-up electrolytes BID while on CRRT per protocol  Tressie Ellis 09/16/2022 7:36 AM

## 2022-09-26 NOTE — Progress Notes (Signed)
I responded to a page from the nurse to provide spiritual support for the patient's family. I arrived at the patient's room where a number of family members were present. I provided spiritual support through pastoral presence, by reading scripture, and leading in prayer.    09/10/2022 1608  Spiritual Encounters  Type of Visit Initial  Care provided to: Pt and family  Conversation partners present during encounter Nurse  Referral source Nurse (RN/NT/LPN)  Reason for visit Urgent spiritual support  Spiritual Framework  Patient Stress Factors None identified  Family Stress Factors Exhausted  Interventions  Spiritual Care Interventions Made Compassionate presence;Prayer;Encouragement    Chaplain Dr Melvyn Novas

## 2022-09-26 NOTE — Plan of Care (Signed)
Patient is extubated per order 

## 2022-09-26 NOTE — Death Summary Note (Signed)
DEATH SUMMARY   Patient Details  Name: [name] MRN: [id number] DOB: Aug 05, 1970  Admission/Discharge Information   Admit Date:  09-17-2022  Date of Death: Date of Death: 10/01/2022  Time of Death: Time of Death: 1742  Length of Stay: 08-Oct-2022  Referring Physician: Orson Eva, NP   Reason(s) for Hospitalization  Acute metabolic encephalopathy, Hypoxic respiratory failure, cardiac arrest  Diagnoses  Preliminary cause of death:  Secondary Diagnoses (including complications and co-morbidities):  Principal Problem:   UTI (urinary tract infection) Active Problems:   Obesity, Class III, BMI 40-49.9 (morbid obesity) (HCC)   Iron deficiency anemia   Insulin-requiring or dependent type II diabetes mellitus (HCC)   Anxiety   Acute on chronic respiratory failure (HCC)   COPD with chronic bronchitis   Chronic diastolic heart failure (HCC)   ESRD on hemodialysis (HCC)   PAD (peripheral artery disease) (HCC)   Acute metabolic encephalopathy   Cervical mass   Brief Hospital Course (including significant findings, care, treatment, and services provided and events leading to death)  [name] is a 52 y.o. year old female with multiple medical problems (ESRD on HD, COPD/Asthma, OSA/OHS, Chronic hypoxic respiratory failure, HTN, HFpEF, T2DM, and morbid obesity) who presented to the hospital with dysuria, treated with antibiotics and anti-fungals. She was moved to the ICU with altered mental status and toxic metabolic encephalopathy. She suffered a cardiac arrest and underwent 15 minutes of CPR prior to ROSC. Off note, the patient had a central line placed by vascular surgery in the right carotid a day prior to her cardiac arrest. This was noted following the arrest and the CVC was removed by vascular surgery with percutaneous closure.  Following ROSC and in the ICU, she was non-responsive and in a vegetative state. She was started on CRRT, sedation was stopped, and CT head/MRI were  performed and did not show any acute findings aside from two small strokes likely of embolic nature. EEG showed diffuse slowing. [name] mental status did not improve 7 days following the cardiac arrest and neurology evaluated her noting a vegetative state, with minimal chance of any meaningful neurological recovery. Following multiple family meetings, family proceeded with withdrawal of care. Following compassionate extubation, the patient passed away surrounded by her loving family.    Pertinent Labs and Studies  Significant Diagnostic Studies CT HEAD WO CONTRAST ( )  Result Date: 08/29/2022 CLINICAL DATA:  Altered mental status EXAM: CT HEAD WITHOUT CONTRAST TECHNIQUE: Contiguous axial images were obtained from the base of the skull through the vertex without intravenous contrast. RADIATION DOSE REDUCTION: This exam was performed according to the departmental dose-optimization program which includes automated exposure control, adjustment of the mA and/or kV according to patient size and/or use of iterative reconstruction technique. COMPARISON:  08/27/2022 FINDINGS: Brain: There is no mass, hemorrhage or extra-axial collection. The size and configuration of the ventricles and extra-axial CSF spaces are normal. The brain parenchyma is normal, without acute or chronic infarction. Vascular: Atherosclerotic calcification of the vertebral and internal carotid arteries at the skull base. No abnormal hyperdensity of the major intracranial arteries or dural venous sinuses. Skull: The visualized skull base, calvarium and extracranial soft tissues are normal. Sinuses/Orbits: Opacification of the right mastoid and middle ear cavity. Partial opacification of the left mastoid air cells. The orbits are normal. IMPRESSION: 1. No acute intracranial abnormality. 2. Opacification of the right mastoid and middle ear cavity. Partial opacification of the left mastoid air cells. Electronically Signed   By: Chrisandra Netters.D.  On: 08/29/2022 20:07   DG Chest Port 1 View  Result Date: 08/29/2022 CLINICAL DATA:  Acute respiratory failure and hypoxia. EXAM: PORTABLE CHEST 1 VIEW COMPARISON:  09/17/2022 FINDINGS: The ET tube tip is 4.3 cm above the carina. enteric tube tip is in the stomach. Stable cardiomediastinal contours. Diffuse pulmonary vascular congestion. Scarring noted within the right midlung. IMPRESSION: Diffuse pulmonary vascular congestion. Electronically Signed   By: Signa Kell M.D.   On: 08/29/2022 06:38   US Abdomen Limited RUQ (LIVER/GB)  Result Date: 08/28/2022 CLINICAL DATA:  Elevated LFTs EXAM: ULTRASOUND ABDOMEN LIMITED RIGHT UPPER QUADRANT COMPARISON:  CT abdomen and pelvis without contrast August 18, 2022; abdominal ultrasound August 18, 2022 FINDINGS: Gallbladder: The gallbladder appears contracted, with mild wall thickening measuring up to 4 mm. No gallstones or pericholecystic fluid. No sonographic Murphy sign noted by sonographer. Common bile duct: Diameter: 1 mm Liver: No focal lesion identified. Within normal limits in parenchymal echogenicity. Portal vein is patent on color Doppler imaging with normal direction of blood flow towards the liver. Other: None. IMPRESSION: Contracted gallbladder.  No evidence of acute cholecystitis. Electronically Signed   By: Jacob Moores M.D.   On: 08/28/2022 16:53   EEG adult  Result Date: 08/28/2022 Charlsie Quest, MD     08/28/2022  2:52 PM Patient Name: [name] MRN: [id number] Epilepsy Attending: Charlsie Quest Referring Physician/Provider: Raechel Chute, MD Date: 08/28/2022 Duration: 40.59 mins Patient history: 52yo F with ams getting eeg to evaluate for seizure Level of alertness:  lethargic AEDs during EEG study: Propofol Technical aspects: This EEG study was done with scalp electrodes positioned according to the 10-20 International system of electrode placement. Electrical activity was reviewed with band pass filter of 1-70Hz , sensitivity of 7 uV/mm,  display speed of 50mm/sec with a 60Hz  notched filter applied as appropriate. EEG data were recorded continuously and digitally stored.  Video monitoring was available and reviewed as appropriate. Description: EEG showed continuous generalized 3 to 6 Hz theta-delta slowing. Generalized periodic discharges with triphasic morphology at 1-1.5 Hz were also noted. Hyperventilation and photic stimulation were not performed.   ABNORMALITY - Periodic discharges with triphasic morphology, generalized ( GPDs) - Continuous slow, generalized IMPRESSION: This study is suggestive of moderate to severe diffuse encephalopathy, nonspecific etiology but could be related to toxic-metabolic causes. No seizures or definite epileptiform discharges were seen throughout the recording. Priyanka Annabelle Harman   MR ANGIO NECK WO CONTRAST  Result Date: 08/27/2022 CLINICAL DATA:  Initial evaluation for neuro deficit, stroke. EXAM: MRA NECK WITHOUT CONTRAST TECHNIQUE: Angiographic images of the neck were acquired using MRA technique without intravenous contrast. Carotid stenosis measurements (when applicable) are obtained utilizing NASCET criteria, using the distal internal carotid diameter as the denominator. COMPARISON:  Comparison made with brain MRI performed at the same time. FINDINGS: Aortic arch: Examination technically limited by lack of IV contrast and motion. Partially visualized aortic arch within normal limits for caliber with standard branch pattern. No stenosis seen about the origin of the great vessels. Right carotid system: Right common and internal carotid arteries are patent with antegrade flow. No evidence for dissection. No visible hemodynamically significant stenosis about the right carotid artery system. Left carotid system: Left common and internal carotid arteries are patent with antegrade flow. No evidence for dissection. No visible hemodynamically significant stenosis about the left carotid artery system. Vertebral  arteries: Both vertebral arteries arise from the subclavian arteries. Right vertebral artery strongly dominant and is patent without evidence for dissection  or stenosis. Left vertebral artery is markedly hypoplastic and not well seen, although appears to be grossly patent with antegrade flow on time-of-flight source sequence. No visible stenosis or dissection. Other: None. IMPRESSION: 1. Negative MRA for large vessel occlusion or other emergent finding. 2. Wide patency of both carotid artery systems within the neck. 3. Widely patent dominant right vertebral artery within the neck. Left vertebral artery hypoplastic and not well seen on this noncontrast exam, but appears grossly patent with antegrade flow. Electronically Signed   By: Rise Mu M.D.   On: 08/27/2022 23:24   MR BRAIN WO CONTRAST  Result Date: 08/27/2022 CLINICAL DATA:  Initial evaluation for neuro deficit, stroke suspected. EXAM: MRI HEAD WITHOUT CONTRAST TECHNIQUE: Multiplanar, multiecho pulse sequences of the brain and surrounding structures were obtained without intravenous contrast. COMPARISON:  Prior CT from earlier the same day. FINDINGS: Brain: Cerebral volume within normal limits. No significant cerebral white matter disease for age. Few scattered subcentimeter foci of restricted diffusion are seen involving the bilateral cerebral hemispheres, overall slightly worse on the right (series 9, images 40, 36, 33, 32, 27, 24). Few additional subcentimeter acute small foci in noted involving the cerebellum (series 9, images 16, 14, 12). Findings consistent with small acute ischemic infarcts. Minimal associated petechial blood products at the right occipital lobe (series 17, image 26). No other associated hemorrhage. No other significant acute or chronic intracranial blood products. No mass lesion, midline shift or mass effect. No hydrocephalus or extra-axial fluid collection. Pituitary gland and suprasellar region within normal limits.  Vascular: Major intracranial vascular flow voids are maintained. Skull and upper cervical spine: Of craniocervical junction within normal limits. Markedly decreased T1 signal intensity seen throughout the visualized bone marrow, nonspecific, but most commonly related to anemia, smoking or obesity. No focal marrow replacing lesion. Sinuses/Orbits: Patient status post ocular lens replacement on the left. Paranasal sinuses are largely clear. Large bilateral mastoid effusions noted. Patient is intubated. Other: None. IMPRESSION: 1. Few scattered subcentimeter acute ischemic infarcts involving the bilateral cerebral and cerebellar hemispheres as above. Minimal associated petechial blood products at the right occipital lobe. No other associated hemorrhage or mass effect. Given the various vascular distributions involved, a central thromboembolic etiology is suspected. 2. Otherwise normal brain MRI for age. Electronically Signed   By: Rise Mu M.D.   On: 08/27/2022 23:19   CT HEAD WO CONTRAST ( )  Result Date: 08/27/2022 CLINICAL DATA:  Provided history: Mental status change, unknown cause. Additional history provided: Cardiac arrest. EXAM: CT HEAD WITHOUT CONTRAST TECHNIQUE: Contiguous axial images were obtained from the base of the skull through the vertex without intravenous contrast. RADIATION DOSE REDUCTION: This exam was performed according to the departmental dose-optimization program which includes automated exposure control, adjustment of the mA and/or kV according to patient size and/or use of iterative reconstruction technique. COMPARISON:  No pertinent prior exams available for comparison. FINDINGS: Brain: No age advanced or lobar predominant parenchymal atrophy. Partially empty sella turcica. No acute infarct/loss of gray-white differentiation is identified. There is no acute intracranial hemorrhage No extra-axial fluid collection. No evidence of an intracranial mass. No midline shift.  Vascular: No hyperdense vessel.  Atherosclerotic calcifications. Skull: No calvarial fracture or aggressive osseous lesion. Sinuses/Orbits: No mass or acute finding within the imaged orbits. No significant paranasal sinus disease at the imaged levels. Other: Large right middle ear/mastoid effusion. Small-volume fluid within the left mastoid air cells. IMPRESSION: 1. No CT evidence of an acute intracranial abnormality. Please note, a brain MRI  would have greater sensitivity for acute hypoxic/ischemic injury. 2. Partially empty sella turcica. This finding can reflect incidental anatomic variation, or alternatively, it can be associated with idiopathic intracranial hypertension (pseudotumor cerebri). 3. Large right middle ear/mastoid effusion. 4. Small-volume fluid within the left mastoid air cells. Electronically Signed   By: Jackey Loge D.O.   On: 08/27/2022 17:05   DG Abd 1 View  Result Date: 08/27/2022 CLINICAL DATA:  Orogastric tube placement EXAM: ABDOMEN - 1 VIEW COMPARISON:  03/03/2017 FINDINGS: The orogastric tube tip is in the stomach antrum with side port in the stomach body. Possible esophageal temperature probe adjacent to the OG tube in the lower thoracic esophagus. Scattered vascular calcifications noted. IMPRESSION: 1. Orogastric tube tip in the stomach antrum with side port in the stomach body. Electronically Signed   By: Gaylyn Rong M.D.   On: 08/27/2022 14:36   DG Foot Complete Right  Result Date: 08/29/2022 CLINICAL DATA:  Osteomyelitis. EXAM: RIGHT FOOT COMPLETE - 3+ VIEW COMPARISON:  Right foot radiographs 04/15/2022, MRI right forefoot 04/24/2022 FINDINGS: There is diffuse decreased bone mineralization. Redemonstration of amputation of the fifth digit to the distal metatarsal shaft, unchanged. Sharp surgical margin. There is again high-grade attenuation of the fourth greater than third toe soft tissues, possible ulceration of the fourth toe lateral to the interphalangeal joints.  Overlap of the proximal phalanx and the distal metatarsal limits evaluation, however there may be progressive erosion of the remaining distal aspect of the fourth metatarsal compared to 04/15/2022. Similarly, within the limitation of overlap of bones at the third metatarsophalangeal joint, there may be progressive bone loss at the third metatarsophalangeal joint. Chronic irregularity and erosion at the second metatarsal head and proximal phalanx of the third digit is not definitively increased. Chronic irregularity at the great toe metatarsophalangeal joint is not definitely changed. There does appear to be progressive transverse bone loss and a possible nonunited fracture at the distal aspect of the proximal phalanx of the great toe. High-grade vascular calcifications. High-grade plantar forefoot soft tissue ulcerations are again noted. No frank soft tissue air. IMPRESSION: 1. Redemonstration of high-grade ulceration throughout the plantar aspect of the distal forefoot and the lateral aspect of the fourth greater than third toe soft tissues. 2. Possible mild progressive erosion of the distal aspect of the remaining fourth metatarsal and the third metatarsophalangeal joint compared to 04/15/2022. 3. Progressive transverse dimension bone loss and possible nonunited fracture at the distal aspect of the proximal phalanx of the great toe. Electronically Signed   By: Neita Garnet M.D.   On: 09/24/2022 16:56   ECHOCARDIOGRAM COMPLETE  Result Date: 09/08/2022    ECHOCARDIOGRAM REPORT   Patient Name:   [name] Broyles Date of Exam: 09/22/2022 Medical Rec #:  [id number]       Height:       64.5 in Accession #:    [id number]      Weight:       282.8 lb Date of Birth:  02-27-1970        BSA:          2.279 m Patient Age:    52 years        BP:           92/74 mmHg Patient Gender: F               HR:           94 bpm. Exam Location:  [facility] Procedure: 2D Echo, Cardiac Doppler and  Color Doppler STAT ECHO Indications:     Cardiac  Arrest I46.9  History:         Patient has prior history of Echocardiogram examinations, most                  recent 09/20/2021. Risk Factors:Hypertension and Diabetes.  Sonographer:     Cristela Blue Referring Phys:  [id number] Gimena Buick Diagnosing Phys: Lorine Bears MD  Sonographer Comments: Echo performed with patient supine and on artificial respirator and Technically challenging study due to limited acoustic windows. IMPRESSIONS  1. Left ventricular ejection fraction, by estimation, is 55 to 60%. The left ventricle has normal function. Left ventricular endocardial border not optimally defined to evaluate regional wall motion. There is mild left ventricular hypertrophy. Left ventricular diastolic parameters are indeterminate.  2. Right ventricular systolic function is moderately reduced. The right ventricular size is moderately enlarged. There is severely elevated pulmonary artery systolic pressure.  3. Left atrial size was mildly dilated.  4. Right atrial size was severely dilated.  5. The mitral valve is abnormal. No evidence of mitral valve regurgitation. Moderate to severe mitral stenosis. The mean mitral valve gradient is 10.0 mmHg. Severe mitral annular calcification.  6. The aortic valve is calcified. Aortic valve regurgitation is not visualized. No aortic stenosis is present by gradient but could be under-estimated.  7. The inferior vena cava is dilated in size with <50% respiratory variability, suggesting right atrial pressure of 15 mmHg.  8. Anterior mitral valve leaflet is severely calcified and could be cuasing some degree of LVOT obstruction but no significant gradient was measured.  9. Challenging image quality. FINDINGS  Left Ventricle: Left ventricular ejection fraction, by estimation, is 55 to 60%. The left ventricle has normal function. Left ventricular endocardial border not optimally defined to evaluate regional wall motion. The left ventricular internal cavity size was normal in size. There  is mild left ventricular hypertrophy. Left ventricular diastolic parameters are indeterminate. Right Ventricle: The right ventricular size is moderately enlarged. No increase in right ventricular wall thickness. Right ventricular systolic function is moderately reduced. There is severely elevated pulmonary artery systolic pressure. The tricuspid regurgitant velocity is 3.72 m/s, and with an assumed right atrial pressure of 15 mmHg, the estimated right ventricular systolic pressure is 70.4 mmHg. Left Atrium: Left atrial size was mildly dilated. Right Atrium: Right atrial size was severely dilated. Pericardium: There is no evidence of pericardial effusion. Mitral Valve: The mitral valve is abnormal. There is moderate thickening of the mitral valve leaflet(s). There is severe calcification of the mitral valve leaflet(s). Severe mitral annular calcification. No evidence of mitral valve regurgitation. Moderate to severe mitral valve stenosis. MV peak gradient, 21.9 mmHg. The mean mitral valve gradient is 10.0 mmHg. Tricuspid Valve: The tricuspid valve is normal in structure. Tricuspid valve regurgitation is mild . No evidence of tricuspid stenosis. Aortic Valve: The aortic valve is calcified. Aortic valve regurgitation is not visualized. No aortic stenosis is present. Aortic valve mean gradient measures 4.3 mmHg. Aortic valve peak gradient measures 6.8 mmHg. Aortic valve area, by VTI measures 1.92 cm. Pulmonic Valve: The pulmonic valve was normal in structure. Pulmonic valve regurgitation is not visualized. No evidence of pulmonic stenosis. Aorta: The aortic root is normal in size and structure. Venous: The inferior vena cava is dilated in size with less than 50% respiratory variability, suggesting right atrial pressure of 15 mmHg. IAS/Shunts: No atrial level shunt detected by color flow Doppler.  LEFT VENTRICLE PLAX 2D LVIDd:  2.70 cm   Diastology LVIDs:         1.60 cm   LV e' medial:    5.87 cm/s LV PW:          1.20 cm   LV E/e' medial:  25.9 LV IVS:        1.40 cm   LV e' lateral:   4.68 cm/s LVOT diam:     2.00 cm   LV E/e' lateral: 32.5 LV SV:         29 LV SV Index:   13 LVOT Area:     3.14 cm  RIGHT VENTRICLE RV Basal diam:  5.80 cm RV Mid diam:    5.20 cm LEFT ATRIUM         Index       RIGHT ATRIUM           Index LA diam:    4.60 cm 2.02 cm/m  RA Area:     34.10 cm                                 RA Volume:   139.00 ml 60.99 ml/m  AORTIC VALVE AV Area (Vmax):    1.75 cm AV Area (Vmean):   1.62 cm AV Area (VTI):     1.92 cm AV Vmax:           130.67 cm/s AV Vmean:          97.733 cm/s AV VTI:            0.151 m AV Peak Grad:      6.8 mmHg AV Mean Grad:      4.3 mmHg LVOT Vmax:         72.70 cm/s LVOT Vmean:        50.300 cm/s LVOT VTI:          0.092 m LVOT/AV VTI ratio: 0.61  AORTA Ao Root diam: 2.70 cm MITRAL VALVE                TRICUSPID VALVE MV Area (PHT): 3.76 cm     TR Peak grad:   55.4 mmHg MV Area VTI:   0.57 cm     TR Vmax:        372.00 cm/s MV Peak grad:  21.9 mmHg MV Mean grad:  10.0 mmHg    SHUNTS MV Vmax:       2.34 m/s     Systemic VTI:  0.09 m MV Vmean:      146.0 cm/s   Systemic Diam: 2.00 cm MV Decel Time: 202 msec MV E velocity: 152.00 cm/s MV A velocity: 200.00 cm/s MV E/A ratio:  0.76 Lorine Bears MD Electronically signed by Lorine Bears MD Signature Date/Time: 08/31/2022/2:57:48 PM    Final    DG Chest Port 1 View  Result Date: 09/02/2022 CLINICAL DATA:  Central line placement EXAM: PORTABLE CHEST 1 VIEW COMPARISON:  Previous studies including the examination done earlier today FINDINGS: Transverse diameter of heart is increased. Central pulmonary vessels are prominent. Apparent shift of mediastinum to the right may be due to rotation. Tip of endotracheal tube is 4 cm above the carina. Enteric tube is noted traversing the esophagus. There is interval removal of right IJ central venous catheter. Linear densities are seen in lower lung fields, more so on the right side. Lateral  CP angles are clear. There is no pneumothorax. IMPRESSION: Cardiomegaly.  Linear densities are seen in the lower lung fields suggesting subsegmental atelectasis. Central pulmonary vessels are prominent without signs of pulmonary edema. There is interval removal of right IJ vascular catheter. Electronically Signed   By: Ernie Avena M.D.   On: 08/31/2022 13:10   CARDIAC CATHETERIZATION  Result Date: 09/12/2022 See surgical note for result.  DG Chest 1 View  Result Date: 09/21/2022 CLINICAL DATA:  Check endotracheal tube placement EXAM: PORTABLE CHEST 1 VIEW COMPARISON:  08/25/2022 FINDINGS: Cardiac shadow is enlarged. Endotracheal tube is noted 3.4 cm above the carina. Right jugular central line is noted coursing across the midline similar to that seen on the prior exam. The tip likely lies within the left innominate vein. Gastric catheter extends towards the stomach. Proximal side port lies at the gastroesophageal junction. This should be advanced deeper into the stomach. Mild central vascular congestion is noted. IMPRESSION: Changes consistent with mild CHF. Tubes and lines as described above. The gastric catheter should be advanced deeper into the stomach. Electronically Signed   By: Alcide Clever M.D.   On: 08/29/2022 03:21   DG Chest Port 1 View  Result Date: 08/25/2022 CLINICAL DATA:  Readjustment of central venous catheter EXAM: PORTABLE CHEST 1 VIEW COMPARISON:  Chest x-ray August 25, 2022 at 1531 hours FINDINGS: Interval retraction of the right central venous catheter, with tip projecting near the left clavicle. Unchanged cardiomegaly and mediastinal contours. Bilateral reticular pulmonary opacities. Moderate right pleural effusion. The visualized skeletal structures are unremarkable. IMPRESSION: 1. Interval retraction of the right central venous catheter, likely within the left subclavian vein. Upon discussion with the ordering provider, venous return was noted during line placement. 2.  Pulmonary edema and moderate right pleural effusion. These findings were discussed with Eli Hose by Jacob Moores at 4:06 pm on 08/25/2022. Electronically Signed   By: Jacob Moores M.D.   On: 08/25/2022 16:06   DG Chest Port 1 View  Result Date: 08/25/2022 CLINICAL DATA:  Central venous catheter placement EXAM: PORTABLE CHEST 1 VIEW COMPARISON:  CT chest PE protocol December 15, 2014 FINDINGS: A right approach central venous catheter is seen crossing the mediastinum and terminates near the left hilum. On review of prior CT PE protocol, a left-sided SVC is not present. Unchanged cardiomegaly and mediastinal contours. Low lung volumes with bronchovascular crowding. Bilateral vascular prominence and reticular pulmonary opacities. Moderate right pleural effusion. The visualized skeletal structures are unremarkable. IMPRESSION: 1. A right approach central venous catheter crosses the mediastinum and terminates near the left hilum. Differential considerations include malpositioning within an accessory vein arising from the left brachiocephalic vein versus arterial placement. Upon discussion with ordering provider, venous return was noted during line placement. However, consider correlation with blood gas. 2. Pulmonary edema and moderate right pleural effusion. These findings were discussed with Eli Hose by Jacob Moores at 4:03 pm on 08/25/2022. Electronically Signed   By: Jacob Moores M.D.   On: 08/25/2022 16:04   DG Chest Port 1 View  Result Date: 07/28/2022 CLINICAL DATA:  Hypoxia. EXAM: PORTABLE CHEST 1 VIEW COMPARISON:  Chest radiograph dated 08/13/2022. FINDINGS: Shallow inspiration. There is mild cardiomegaly with mild central vascular congestion. There is blunting of the right costophrenic angle which may represent trace effusion. Right lung base atelectasis or infiltrate. No pneumothorax. No acute osseous pathology. IMPRESSION: 1. Mild cardiomegaly with mild central vascular congestion. 2.  Right lung base atelectasis or infiltrate. Electronically Signed   By: Elgie Collard M.D.   On: 08/14/2022 17:21   US  PELVIS (TRANSABDOMINAL ONLY)  Result Date: 08/19/2022 CLINICAL DATA:  Fibroid uterus EXAM: TRANSABDOMINAL ULTRASOUND OF PELVIS TECHNIQUE: Transabdominal ultrasound examination of the pelvis was performed including evaluation of the uterus, ovaries, adnexal regions, and pelvic cul-de-sac. COMPARISON:  CT scan 08/18/2018 FINDINGS: Uterus Measurements: 9.1 x 6.6 x 9.5 cm = volume: 294 mL. The uterus is diffusely heterogeneous. Of note the images are limited due to contracted urinary bladder overlapping bowel gas and soft tissue. Endometrium Thickness: Not seen.  Obscured Right ovary Measurements: Not Seen. Left ovary Measurements: Not seen. Other findings:  No free fluid. IMPRESSION: Extremely limited examination. Heterogeneous uterus consistent with multiple areas of calcification. These could be vascular. Underlying mass lesion or fibroid is not excluded. The ovaries and endometrium are also obscured. Overall additional cross-sectional study as clinically directed for further delineation Electronically Signed   By: Karen Kays M.D.   On: 08/19/2022 10:59   US Abdomen Limited RUQ (LIVER/GB)  Result Date: 08/02/2022 CLINICAL DATA:  Abnormal liver function tests EXAM: ULTRASOUND ABDOMEN LIMITED RIGHT UPPER QUADRANT COMPARISON:  CT abdomen and pelvis dated 08/04/2022 FINDINGS: Gallbladder: No gallstones or wall thickening visualized. No sonographic Murphy sign noted by sonographer. Trace pericholecystic free fluid. Common bile duct: Diameter: 3 mm Liver: No focal lesion identified. Subjective diffuse periportal echogenicity. Portal vein is patent on color Doppler imaging with normal direction of blood flow towards the liver. Other: None. IMPRESSION: 1. Subjective diffuse periportal echogenicity, which is nonspecific but can be seen in the setting of hepatitis. 2. Trace pericholecystic free  fluid, nonspecific. No sonographic evidence of acute cholecystitis. Electronically Signed   By: Agustin Cree M.D.   On: 08/12/2022 21:04   X-ray chest PA and lateral  Result Date: 08/03/2022 CLINICAL DATA:  Leukocytosis and shortness of breath. EXAM: CHEST - 2 VIEW COMPARISON:  Radiographs 04/24/2022 FINDINGS: Stable cardiomediastinal silhouette. Low lung volumes. Bibasilar atelectasis or infiltrates. No pleural effusion or pneumothorax. IMPRESSION: Bibasilar atelectasis or infiltrates. Electronically Signed   By: Minerva Fester M.D.   On: 08/17/2022 19:02   CT ABDOMEN PELVIS WO CONTRAST  Result Date: 08/02/2022 CLINICAL DATA:  Right lower quadrant abdominal pain EXAM: CT ABDOMEN AND PELVIS WITHOUT CONTRAST TECHNIQUE: Multidetector CT imaging of the abdomen and pelvis was performed following the standard protocol without IV contrast. RADIATION DOSE REDUCTION: This exam was performed according to the departmental dose-optimization program which includes automated exposure control, adjustment of the mA and/or kV according to patient size and/or use of iterative reconstruction technique. COMPARISON:  CT examination dated April 24, 2021 FINDINGS: Lower chest: Subsegmental atelectasis in the right lower lobe. No acute abnormality Hepatobiliary: No focal liver abnormality is seen. No gallstones, gallbladder wall thickening, or biliary dilatation. Pancreas: Unremarkable. No pancreatic ductal dilatation or surrounding inflammatory changes. Spleen: Normal in size without focal abnormality. Adrenals/Urinary Tract: Adrenal glands are unremarkable. Kidneys are normal, without renal calculi, focal lesion, or hydronephrosis. Bladder is unremarkable. Stomach/Bowel: Stomach is within normal limits. Appendix appears normal. No evidence of bowel wall thickening, distention, or inflammatory changes. Vascular/Lymphatic: Moderate aortic atherosclerosis. No enlarged abdominal or pelvic lymph nodes. Reproductive: Enlarged uterus  with degenerated fibroid with calcification. Bilateral adnexa are unremarkable. Other: No abdominal wall hernia or abnormality. No abdominopelvic ascites. Musculoskeletal: No acute or significant osseous findings. IMPRESSION: 1. No CT evidence of acute abdominal/pelvic process. Normal appendix. 2. Enlarged uterus with degenerated fibroid with calcification. 3. Moderate aortic atherosclerosis. Electronically Signed   By: Larose Hires D.O.   On: 08/21/2022 14:38    Microbiology Recent  Results (from the past 240 hour(s))  Culture, blood (Routine X 2) w Reflex to ID Panel     Status: None   Collection Time: 08/25/22 12:59 PM   Specimen: BLOOD  Result Value Ref Range Status   Specimen Description BLOOD BLOOD RIGHT ARM RAC  Final   Special Requests   Final    BOTTLES DRAWN AEROBIC AND ANAEROBIC Blood Culture adequate volume   Culture   Final    NO GROWTH 5 DAYS Performed at [facility], [address] [place], [place] [address]    Report Status 08/30/2022 FINAL  Final  MRSA Next Gen by PCR, Nasal     Status: None   Collection Time: 08/25/22  2:01 PM   Specimen: Nasal Mucosa; Nasal Swab  Result Value Ref Range Status   MRSA by PCR Next Gen NOT DETECTED NOT DETECTED Final    Comment: (NOTE) The GeneXpert MRSA Assay (FDA approved for NASAL specimens only), is one component of a comprehensive MRSA colonization surveillance program. It is not intended to diagnose MRSA infection nor to guide or monitor treatment for MRSA infections. Test performance is not FDA approved in patients less than 6 years old. Performed at [facility], [address] [place], [place] [address]   Culture, blood (Routine X 2) w Reflex to ID Panel     Status: None   Collection Time: 08/25/22  3:59 PM   Specimen: BLOOD  Result Value Ref Range Status   Specimen Description BLOOD PICC LINE  Final   Special Requests   Final    BOTTLES DRAWN AEROBIC AND ANAEROBIC Blood Culture adequate volume    Culture   Final    NO GROWTH 5 DAYS Performed at [facility], [address] [place], [place] [address]    Report Status 08/30/2022 FINAL  Final  Culture, Respiratory w Gram Stain     Status: None   Collection Time: 09/20/2022  1:28 PM   Specimen: Tracheal Aspirate; Respiratory  Result Value Ref Range Status   Specimen Description   Final    TRACHEAL ASPIRATE Performed at [facility], [address], [place], [place] [address]    Special Requests   Final    NONE Performed at [facility], [address] [place], [place] [address]    Gram Stain   Final    ABUNDANT WBC PRESENT, PREDOMINANTLY PMN RARE BUDDING YEAST SEEN RARE GRAM POSITIVE COCCI IN PAIRS    Culture   Final    FEW Consistent with normal respiratory flora. Performed at [facility], [address], [place], [place] [address]    Report Status 08/28/2022 FINAL  Final  MRSA Next Gen by PCR, Nasal     Status: None   Collection Time: 08/30/2022  5:21 PM   Specimen: Nasal Mucosa; Nasal Swab  Result Value Ref Range Status   MRSA by PCR Next Gen NOT DETECTED NOT DETECTED Final    Comment: (NOTE) The GeneXpert MRSA Assay (FDA approved for NASAL specimens only), is one component of a comprehensive MRSA colonization surveillance program. It is not intended to diagnose MRSA infection nor to guide or monitor treatment for MRSA infections. Test performance is not FDA approved in patients less than 68 years old. Performed at [facility], [address] [place], [place] [address]   Culture, Respiratory w Gram Stain     Status: None   Collection Time: 08/27/22  7:30 PM   Specimen: Tracheal Aspirate; Respiratory  Result Value Ref Range Status   Specimen Description   Final    TRACHEAL ASPIRATE Performed at [facility] [address] [place], [place] [address]    Special Requests   Final    NONE Performed at [facility], [address]  [place], [place] [address]    Gram Stain   Final    ABUNDANT WBC PRESENT, PREDOMINANTLY PMN RARE GRAM POSITIVE COCCI IN PAIRS    Culture   Final    RARE Normal respiratory flora-no Staph aureus or Pseudomonas seen Performed at [facility], [address], [place], [place] [address]    Report Status 08/30/2022 FINAL  Final    Lab Basic Metabolic Panel: Recent Labs  Lab 08/28/22 0347 08/28/22 1532 08/29/22 0408 08/29/22 1645 08/30/22 0408 08/30/22 1535 08/31/22 0257 08/31/22 1600 09/11/2022 0421  NA 129*   < > 127*   < > 132* 132* 136 131* 131*  K 3.6   < > 4.0   < > 3.8 4.2 4.0 3.7 3.9  CL 94*   < > 96*   < > 101 101 99 102 99  CO2 20*   < > 18*   < > 21* 19* 23 19* 19*  GLUCOSE 297*   < > 298*   < > 313* 238* 190* 221* 205*  BUN 41*   < > 33*   < > 33* 32* 30* 39* 34*  CREATININE 3.16*   < > 2.17*   < > 1.68* 1.45* 1.33* 1.53* 1.44*  CALCIUM 8.7*   < > 8.3*   < > 8.3* 8.4* 8.6* 7.6* 8.2*  MG 2.1  --  2.3  --  2.5*  --  2.5*  --  2.4  PHOS 3.4   < > 2.6   < > 2.8 2.8 2.1* 2.5 2.4*   < > = values in this interval not displayed.   Liver Function Tests: Recent Labs  Lab 09/10/2022 0351 08/29/2022 1316 09/07/2022 1721 08/28/22 0347 08/28/22 1532 08/29/22 0408 08/29/22 1645 08/30/22 0407 08/30/22 0408 08/30/22 1535 08/31/22 0257 08/31/22 1600 09/18/2022 0421  AST 263* 329*  --  653*  --  803*  --  388*  --   --   --   --   --   ALT 65* 73*  --  162*  --  247*  --  205*  --   --   --   --   --   ALKPHOS 570* 547*  --  602*  --  722*  --  689*  --   --   --   --   --   BILITOT 2.5* 2.5*  --  4.3*  --  4.3*  --  3.7*  --   --   --   --   --   PROT 6.5 6.5  --  6.5  --  6.5  --  6.2*  --   --   --   --   --   ALBUMIN 3.0* 2.9*   < > 2.8*  3.0*   < > 2.7*  2.7*   < > 2.5* 2.5* 2.5* 2.5* 2.4* 2.5*   < > = values in this interval not displayed.   No results for input(s): "LIPASE", "AMYLASE" in the last 168 hours. Recent Labs  Lab 08/31/22 0736  AMMONIA 54*    CBC: Recent Labs  Lab 09/22/2022 1316 08/27/22 0417 08/28/22 9147 08/29/22 0408 08/30/22 0408 08/31/22 8295  08/31/2022 0421  WBC 24.7* 20.4* 34.5* 29.8* 29.9* 29.4* 26.7*  NEUTROABS 21.6* 16.5* 30.3*  --   --   --   --   HGB 14.1 13.8 13.7 13.7 13.3 14.1 13.3  HCT 42.1 41.0 41.6 40.9 40.1 43.0 41.9  MCV 88.3 88.0 89.5 88.1 89.1 92.1 93.5  PLT 284 259 171 117* 104* 117* 99*   Cardiac Enzymes: No results for input(s): "CKTOTAL", "CKMB", "CKMBINDEX", "TROPONINI" in the last 168 hours. Sepsis Labs: Recent Labs  Lab 09/09/2022 0351 09/03/2022 0708 09/02/2022 1225 08/27/2022 1316 08/27/22 0417 08/27/22 8119 08/28/22 0347 08/29/22 0408 08/30/22 0408 08/31/22 0257 09/09/2022 0421  PROCALCITON  --   --   --  3.71  --   --   --   --   --   --   --   WBC 23.0*  --   --  24.7*   < >  --    < > 29.8* 29.9* 29.4* 26.7*  LATICACIDVEN 6.9* 6.2* 5.2*  --   --  2.5*  --   --   --   --   --    < > = values in this interval not displayed.     Onofre Gains 08/31/2022, 6:48 PM

## 2022-09-26 NOTE — IPAL (Signed)
  Interdisciplinary Goals of Care Family Meeting   Date carried out: 09/05/2022  Location of the meeting: Conference room  Member's involved: Nurse Practitioner and Family Member or next of kin  Durable Power of Attorney or acting medical decision maker: Spouse, Children, Siblings along with multiple family members     Discussion: We discussed goals of care for [name] .  Would like to proceed with transitioning pt to comfort care only at 1533  Code status:   Code Status: DNR   Disposition: In-patient comfort care  Time spent for the meeting: 15 minutes     Zada Girt, AGNP  Pulmonary/Critical Care Pager [phone] (please enter 7 digits) PCCM Consult Pager [phone] (please enter 7 digits)

## 2022-09-26 NOTE — Progress Notes (Signed)
NAME:  [name], MRN:  [id number], DOB:  Aug 06, 1970, LOS: 14 ADMISSION DATE:  08/16/2022, CHIEF COMPLAINT:  Cardiac Arrest   History of Present Illness:  Patient is a 52 year old female with a past medical history of ESRD (on HD), COPD/Asthma, OSA/OHS, chronic hypoxic respiratory failure, HTN, HFpEF, T2DM, and morbid obesity who presented to the hospital with dysuria, now in the ICU with altered mental status complicated by cardiac arrest.   Patient initially presented to the hospital on 08/11/2022 with dysuria and admitted for the management of UTI. Cultures have grown yeast, and she's received IV antibiotics as well as anti-fungal therapy. During the course of her hospitalization she was evaluated by gynecology and underwent a cervical biopsy. [name] mental status was waxing and waning in the setting of ESRD, acidemia, and hypercapnia. She did not tolerate BiPAP nocturnally. Given worsening, patient was moved to the ICU for monitoring as well as for consideration of CRRT. She had a central line placed by vascular surgery at the bedside yesterday.    Overnight, patient was noted to be unresponsive with the development of bradycardia followed by PEA arrest. She was resuscitated after 15 minutes of CPR and this morning was noted to be on pressors.   Significant Hospital Events: Including procedures, antibiotic start and stop dates in addition to other pertinent events   08/25/2022: admitted to the ICU 09/15/2022: right CVC noted in arterial position, vascular surgery discontinued. Dialysis catheter placed. CRRT initiated. 08/27/2022: continued on CRRT with volume removal. CT head and MRI brain performed, no sign of hypoxic brain injury. 08/28/2022: continued on CRRT, pressor requirements down. EEG with diffuse slowing. Opens eyes but does not follow commands. Family meeting held, updated on progress 08/29/2022: continued on CRRT. Anisocoria noted in the afternoon, repeat head CT unchanged. Family  meeting held, family updated 08/30/2022: continued on CRRT, had to run even due to hypotension. Afib with RVR, switched to phenylephrine and started amiodarone, with improvement. Family meeting held, family updated, asked for transfer to [facility] called and they are at capacity and not accepting any transfers to MICU 08/31/2022: On CRRT, family meeting (see IPAL) and decision to proceed with withdrawal of care 09/14/2022: remains unresponsive  Interim History / Subjective:  Opens eyes to verbal and tactile stimuli but doesn't follow commands.  Objective   Blood pressure (!) 125/111, pulse 88, temperature (!) 95.7 F (35.4 C), resp. rate (!) 29, height 5' 4.5" (1.638 m), weight 129.5 kg, last menstrual period 10/14/2012, SpO2 97 %.    Vent Mode: PRVC FiO2 (%):  [40 %] 40 % Set Rate:  [18 bmp] 18 bmp Vt Set:  [500 mL] 500 mL PEEP:  [8 cmH20] 8 cmH20 Plateau Pressure:  [0.25 cmH20] 0.25 cmH20   Intake/Output Summary (Last 24 hours) at 09/05/2022 1116 Last data filed at 09/03/2022 0930 Gross per 24 hour  Intake 3033.1 ml  Output 3050 ml  Net -16.9 ml    Filed Weights   08/30/22 0300 08/31/22 0203 09/17/2022 0500  Weight: 127.9 kg 128 kg 129.5 kg    Examination: Physical Exam Constitutional:      General: She is not in acute distress.    Appearance: She is obese. She is ill-appearing.  HENT:     Head: Normocephalic.  Cardiovascular:     Rate and Rhythm: Normal rate and regular rhythm.     Pulses: Normal pulses.     Heart sounds: Normal heart sounds.  Pulmonary:     Comments: Ventilated  breath sounds anteriorly on auscultation Abdominal:     General: There is distension.     Palpations: Abdomen is soft.  Musculoskeletal:     Right lower leg: Edema present.     Left lower leg: Edema present.     Comments: Right foot diabetic ulcer  Neurological:     Mental Status: She is disoriented.     Comments: Pupils unequal and very sluggish to reach. Opens eyes but does not follow commands.      Assessment & Plan:   Neurology #Toxic Metabolic Encephalopathy #Vegetative State   This is in the setting of cardiac arrest, metabolic acidosis, toxic metabolite accumulation secondary to ESRD, as well as CO2 narcosis. Patient was intubated in the setting of her cardiac arrest, and was initiated on propofol for sedation with PRN's in place for analgesia. Propofol has been discontinued since Monday. CRRT initiated for clearance of toxic metabolites. CT head and MRI brain performed, no findings to suggest hypoxic injury but MRI does show scattered ischemic infarcts but this does not explain her encephalopathy. EEG shows diffuse slowing. Received one dose of Cefepime which was changed to Zosyn. Evaluated by neurology yesterday, impression is that she is in a vegetative state. Discussed with family, explained prognosis and what that entails. They would like to proceed with withdrawal of care.  -Comfort measures only today  Cardiovascular #Cardiac Arrest #Circulatory Shock #HFpEF #Group 2 Pulmonary Hypertension #Inadvertent CVC placement in the carotid #Afib with RVR  Cardiac arrest was likely secondary to CO2 narcosis and metabolic acidosis, preceded by bradycardia. Blood gas had shown acidosis peri-arrest. Rise in troponin likely secondary to chest compressions and ACLS, with post arrest EKG not suggestive of ischemia nor arrhythmia. Repeat echocardiogram showed dilated right ventricle, elevated PASP, and moderate to severe mitral stenosis similar to prior. She is on CRRT with overall negative balance with ultra-filtration.   Developed circulatory shock post cardiac arrest, with rise in pressors yesterday in the setting of afib with RVR. We started amiodarone, switched nor-epi to phenylephrine, and ran her overall positive yesterday with stabilization in hemodynamics.    -Comfort measures only today   Pulmonary #Acute on Chronic Hypoxic Respiratory Failure #OSA/OHS #Asthma/COPD  overlap   Intubated in the setting of hypoxic arrest; known history of severe OHS/OSA and asthma/COPD. Currently ventilated with blood gases showing metabolic acidosis. Will follow blood gas and adjust ventilator settings accordingly. Did have increased secretions from the ETT which were cultured and were overall negative.   -Comfort measures only today   Gastrointestinal #Elevated liver enzymes   LFT's are chronically elevated. RUQ ultrasound on admission did not showing acute findings. Likely NAFLD. Patient also on PPI for SUP and tube feeds were initiated. was on fluconazole which could contribute to LFT elevation and was discontinued. Similarly switched from H2B to PPI. LFT's improved.   -Comfort measures only today   Renal #ESRD on HD #Anion Gap Metabolic Acidosis   History of ESRD on HD. Given hemodynamic instability, pressor use, and cardiac arrest, did proceed with CRRT for management of acidosis and volume status. Appreciate input from nephrology.   -Comfort measures only today  Endocrine #T2DM   ICU glycemic protocol. Check lipid panel. wean stress dose steroids.   Hem/Onc heparin subQ for DVT prophylaxis, monitor H/H and platelet counts   ID #UTI #Aspiration Pneumonia #Sepsis   Patient treated for UTI with Ceftriaxone, which we did broaden, now on Zosyn and Vancomycin, pending respiratory culture results.   -Comfort measures only today  Best Practice (right click and "Reselect all SmartList Selections" daily)   Diet/type: tubefeeds DVT prophylaxis: prophylactic heparin  GI prophylaxis: PPI Lines: Central line, Dialysis Catheter, Arterial Line, and yes and it is still needed Foley:  Yes, and it is still needed Code Status:  DNR Last date of multidisciplinary goals of care discussion [09/18/2022]  Labs   CBC: Recent Labs  Lab 09/19/2022 1316 08/27/22 0417 08/28/22 0347 08/29/22 0408 08/30/22 0408 08/31/22 0257 09/24/2022 0421  WBC 24.7* 20.4* 34.5*  29.8* 29.9* 29.4* 26.7*  NEUTROABS 21.6* 16.5* 30.3*  --   --   --   --   HGB 14.1 13.8 13.7 13.7 13.3 14.1 13.3  HCT 42.1 41.0 41.6 40.9 40.1 43.0 41.9  MCV 88.3 88.0 89.5 88.1 89.1 92.1 93.5  PLT 284 259 171 117* 104* 117* 99*     Basic Metabolic Panel: Recent Labs  Lab 08/28/22 0347 08/28/22 1532 08/29/22 0408 08/29/22 1645 08/30/22 0408 08/30/22 1535 08/31/22 0257 08/31/22 1600 09/15/2022 0421  NA 129*   < > 127*   < > 132* 132* 136 131* 131*  K 3.6   < > 4.0   < > 3.8 4.2 4.0 3.7 3.9  CL 94*   < > 96*   < > 101 101 99 102 99  CO2 20*   < > 18*   < > 21* 19* 23 19* 19*  GLUCOSE 297*   < > 298*   < > 313* 238* 190* 221* 205*  BUN 41*   < > 33*   < > 33* 32* 30* 39* 34*  CREATININE 3.16*   < > 2.17*   < > 1.68* 1.45* 1.33* 1.53* 1.44*  CALCIUM 8.7*   < > 8.3*   < > 8.3* 8.4* 8.6* 7.6* 8.2*  MG 2.1  --  2.3  --  2.5*  --  2.5*  --  2.4  PHOS 3.4   < > 2.6   < > 2.8 2.8 2.1* 2.5 2.4*   < > = values in this interval not displayed.    GFR: Estimated Creatinine Clearance: 62.2 mL/min (A) (by C-G formula based on SCr of 1.44 mg/dL (H)). Recent Labs  Lab 09/02/2022 0351 09/15/2022 0708 09/11/2022 1225 09/11/2022 1316 08/27/22 0417 08/27/22 1610 08/28/22 0347 08/29/22 0408 08/30/22 0408 08/31/22 0257 08/31/2022 0421  PROCALCITON  --   --   --  3.71  --   --   --   --   --   --   --   WBC 23.0*  --   --  24.7*   < >  --    < > 29.8* 29.9* 29.4* 26.7*  LATICACIDVEN 6.9* 6.2* 5.2*  --   --  2.5*  --   --   --   --   --    < > = values in this interval not displayed.     Liver Function Tests: Recent Labs  Lab 09/16/2022 0351 09/24/2022 1316 08/30/2022 1721 08/28/22 0347 08/28/22 1532 08/29/22 0408 08/29/22 1645 08/30/22 0407 08/30/22 0408 08/30/22 1535 08/31/22 0257 08/31/22 1600 09/10/2022 0421  AST 263* 329*  --  653*  --  803*  --  388*  --   --   --   --   --   ALT 65* 73*  --  162*  --  247*  --  205*  --   --   --   --   --   ALKPHOS 570* 547*  --  602*  --  722*  --   689*  --   --   --   --   --   BILITOT 2.5* 2.5*  --  4.3*  --  4.3*  --  3.7*  --   --   --   --   --   PROT 6.5 6.5  --  6.5  --  6.5  --  6.2*  --   --   --   --   --   ALBUMIN 3.0* 2.9*   < > 2.8*  3.0*   < > 2.7*  2.7*   < > 2.5* 2.5* 2.5* 2.5* 2.4* 2.5*   < > = values in this interval not displayed.    No results for input(s): "LIPASE", "AMYLASE" in the last 168 hours. Recent Labs  Lab 08/31/22 0736  AMMONIA 54*     ABG    Component Value Date/Time   PHART 7.34 (L) 08/28/2022 0842   PCO2ART 37 08/28/2022 0842   PO2ART 90 08/28/2022 0842   HCO3 20.0 08/28/2022 0842   TCO2 23 10/26/2020 1619   ACIDBASEDEF 5.2 (H) 08/28/2022 0842   O2SAT 98.4 08/28/2022 0842     Coagulation Profile: No results for input(s): "INR", "PROTIME" in the last 168 hours.  Cardiac Enzymes: No results for input(s): "CKTOTAL", "CKMB", "CKMBINDEX", "TROPONINI" in the last 168 hours.  HbA1C: Hgb A1c MFr Bld  Date/Time Value Ref Range Status  08/30/2022 04:08 AM 7.5 (H) 4.8 - 5.6 % Final    Comment:    (NOTE) Pre diabetes:          5.7%-6.4%  Diabetes:              >6.4%  Glycemic control for   <7.0% adults with diabetes   08/23/2022 12:11 PM 7.8 (H) 4.8 - 5.6 % Final    Comment:    (NOTE) Pre diabetes:          5.7%-6.4%  Diabetes:              >6.4%  Glycemic control for   <7.0% adults with diabetes     CBG: Recent Labs  Lab 08/31/22 1606 08/31/22 1922 08/31/22 2333 08/27/2022 0425 09/02/2022 0802  GLUCAP 214* 202* 197* 223* 212*     Review of Systems:   Unable to obtain  Past Medical History:  She,  has a past medical history of (HFpEF) heart failure with preserved ejection fraction (HCC), Anxiety, Asthma, Bronchitis, CHF (congestive heart failure) (HCC), Chronic cough, Chronic respiratory failure with hypoxia and hypercapnia (HCC), CKD (chronic kidney disease), stage III (HCC), Depression, Diabetes mellitus without complication (HCC), Diverticulosis, Dyspnea,  Dysrhythmia, Environmental and seasonal allergies, Extreme obesity with alveolar hypoventilation (HCC), Hypertension, Iron deficiency anemia, Kidney failure, Orthopnea, Oxygen dependent, Pericardial effusion, Pneumonia, Poorly controlled diabetes mellitus (HCC), and Swelling of both lower extremities.   Surgical History:   Past Surgical History:  Procedure Laterality Date   A/V FISTULAGRAM Left 06/09/2017   Procedure: A/V FISTULAGRAM;  Surgeon: Annice Needy, MD;  Location: [facility];  Service: Cardiovascular;  Laterality: Left;   A/V FISTULAGRAM Left 06/16/2017   Procedure: A/V FISTULAGRAM;  Surgeon: Annice Needy, MD;  Location: [facility];  Service: Cardiovascular;  Laterality: Left;   A/V FISTULAGRAM Left 01/28/2018   Procedure: A/V FISTULAGRAM;  Surgeon: Annice Needy, MD;  Location: [facility];  Service: Cardiovascular;  Laterality: Left;   AMPUTATION TOE Right 10/21/2020   Procedure:  AMPUTATION TOE-5th Digit Amputation Right Foot;  Surgeon: Felecia Shelling, DPM;  Location: [facility];  Service: Podiatry;  Laterality: Right;   AV FISTULA PLACEMENT Left 04/25/2017   Procedure: ARTERIOVENOUS (AV) FISTULA CREATION;  Surgeon: Annice Needy, MD;  Location: [facility];  Service: Vascular;  Laterality: Left;   BONE BIOPSY Right 04/27/2021   Procedure: Right 4th metatarsal BONE BIOPSY;  Surgeon: Candelaria Stagers, DPM;  Location: [facility];  Service: Podiatry;  Laterality: Right;   CATARACT EXTRACTION W/PHACO Left 01/21/2018   Procedure: CATARACT EXTRACTION PHACO AND INTRAOCULAR LENS PLACEMENT (IOC);  Surgeon: Lockie Mola, MD;  Location: [facility];  Service: Ophthalmology;  Laterality: Left;  [place] 01:55.5 CDE 9.05 EAUP 13.9 Fluid Pack Lot # [id number]   CENTRAL LINE INSERTION N/A 08/31/2022   Procedure: CENTRAL LINE INSERTION;  Surgeon: Renford Dills, MD;  Location: [facility];  Service: Cardiovascular;  Laterality: N/A;   CERVICAL CONIZATION W/BX N/A 08/10/2022    Procedure: CERVICAL BIOPSY;  Surgeon: Feliberto Gottron, Ihor Austin, MD;  Location: [facility];  Service: Gynecology;  Laterality: N/A;   CESAREAN SECTION     times 3   DIALYSIS/PERMA CATHETER INSERTION Right 04/04/2017   Procedure: DIALYSIS/PERMA CATHETER INSERTION;  Surgeon: Renford Dills, MD;  Location: [facility];  Service: Cardiovascular;  Laterality: Right;   DIALYSIS/PERMA CATHETER REMOVAL N/A 09/17/2017   Procedure: DIALYSIS/PERMA CATHETER REMOVAL;  Surgeon: Annice Needy, MD;  Location: [facility];  Service: Cardiovascular;  Laterality: N/A;   INCISION AND DRAINAGE Right 10/26/2020   Procedure: INCISION AND DRAINAGE;  Surgeon: Candelaria Stagers, DPM;  Location: [facility];  Service: Podiatry;  Laterality: Right;   INSERTION OF DIALYSIS CATHETER N/A 04/16/2017   Procedure: INSERTION OF DIALYSIS PERM CATHETER;  Surgeon: Annice Needy, MD;  Location: [facility];  Service: Vascular;  Laterality: N/A;   LOWER EXTREMITY ANGIOGRAPHY Right 10/18/2020   Procedure: Lower Extremity Angiography;  Surgeon: Annice Needy, MD;  Location: [facility];  Service: Cardiovascular;  Laterality: Right;   LOWER EXTREMITY ANGIOGRAPHY Right 04/17/2022   Procedure: Lower Extremity Angiography;  Surgeon: Annice Needy, MD;  Location: [facility];  Service: Cardiovascular;  Laterality: Right;     Social History:   reports that she has never smoked. She has been exposed to tobacco smoke. She has never used smokeless tobacco. She reports that she does not currently use alcohol. She reports current drug use. Drug: Marijuana.   Family History:  Her family history includes Breast cancer (age of onset: 44) in her maternal aunt and maternal aunt; Diabetes in her father, mother, and another family member; Hypertension in her father, mother, and another family member.   Allergies Allergies  Allergen Reactions   Dust Mite Extract Shortness Of Breath and Itching   Mold Extract [Trichophyton] Shortness Of  Breath and Itching   Pollen Extract Shortness Of Breath    Itching, shortness of breath, asthma like symptoms   Ozempic (0.25 Or 0.5 Mg-Dose) [Semaglutide(0.25 Or 0.5mg -Dos)] Other (See Comments)    pancreatitis   Sulfa Antibiotics Itching   Feraheme [Ferumoxytol] Itching    Uses benadryl so she can get the medicine     Home Medications  Prior to Admission medications   Medication Sig Start Date End Date Taking? Authorizing Provider  acetaminophen (TYLENOL) 500 MG tablet Take 1,000 mg by mouth every 6 (six) hours as needed for mild pain, fever or headache.   Yes [provider]  albuterol (PROVENTIL) (2.5 MG/3ML) 0.083% nebulizer solution Take 3 mLs (2.5 mg total) by nebulization every 6 (six) hours as needed for wheezing or shortness of breath. 02/15/22  Yes Salena Saner, MD  albuterol (VENTOLIN HFA) 108 (90 Base) MCG/ACT inhaler Inhale 2 puffs into the lungs every 6 (six) hours as needed for wheezing or shortness of breath. 02/15/22  Yes Salena Saner, MD  atorvastatin (LIPITOR) 40 MG tablet Take 1 tablet (40 mg total) by mouth daily. 04/20/22  Yes Regalado, Belkys A, MD  benzonatate (TESSALON PERLES) 100 MG capsule Take 1 capsule (100 mg total) by mouth 3 (three) times daily as needed for cough. 05/22/22 05/22/23 Yes Boswell, Chelsa, NP  budesonide (PULMICORT) 0.5 MG/2ML nebulizer solution USE 1 VIAL  IN  NEBULIZER TWICE  DAILY - (Rinse Mouth After Each Treatment) 08/13/22  Yes Orson Eva, NP  ciprofloxacin (CIPRO) 500 MG tablet Take 500 mg by mouth daily. 08/13/22  Yes [provider]  clindamycin (CLEOCIN T) 1 % lotion APPLY TO FACE, ARMS AND LEGS EVENLY NIGHTLY AT BEDTIME 06/27/22  Yes Boswell, Chelsa, NP  escitalopram (LEXAPRO) 10 MG tablet Take 1 tablet (10 mg total) by mouth daily. 04/24/22 04/24/23 Yes Boswell, Gareth Morgan, NP  formoterol (PERFOROMIST) 20 MCG/2ML nebulizer solution USE 1 VIAL  IN  NEBULIZER TWICE  DAILY - (Morning And Evening) 08/13/22  Yes Boswell,  Chelsa, NP  gentamicin cream (GARAMYCIN) 0.1 % Apply 1 Application topically at bedtime. 06/27/22  Yes [provider]  insulin degludec (TRESIBA) 100 UNIT/ML FlexTouch Pen Inject 20 Units into the skin daily. 04/20/22  Yes Regalado, Belkys A, MD  lidocaine-prilocaine (EMLA) cream Apply topically as needed (For access for dialysis).   Yes [provider]  LINZESS 290 MCG CAPS capsule TAKE 1 CAPSULE BY MOUTH IN THE MORNING WITH A GLASS OF WATER 30 MINUTES BEFORE BREAKFAST 08/08/22  Yes Miki Kins, FNP  methocarbamol (ROBAXIN) 750 MG tablet Take 750 mg by mouth every 8 (eight) hours as needed for muscle spasms.   Yes [provider]  midodrine (PROAMATINE) 10 MG tablet Take 20 mg by mouth daily as needed (for low blood pressure on dialysis days). 08/23/21  Yes [provider]  ondansetron (ZOFRAN) 4 MG tablet Take 4 mg by mouth 2 (two) times daily as needed for nausea. 07/02/22  Yes [provider]  pregabalin (LYRICA) 50 MG capsule TAKE 1 CAPSULE BY MOUTH EVERY 8 HOURS AS NEEDED FOR  NEUROPATHY 07/01/22  Yes Orson Eva, NP  revefenacin (YUPELRI) 175 MCG/3ML nebulizer solution  01/19/21  Yes [provider]  sevelamer (RENAGEL) 800 MG tablet Take 800-2,400 mg by mouth 4 (four) times daily. Three tablets three times daily with meals and 1 tablet with snacks once daily   Yes [provider]  torsemide (DEMADEX) 100 MG tablet Take 100 mg by mouth daily. 05/21/22  Yes [provider]  aspirin EC 81 MG tablet Take 81 mg by mouth daily.  Patient not taking: Reported on 08/19/2022    [provider]  cloNIDine (CATAPRES) 0.1 MG tablet Take 0.1 mg by mouth 2 (two) times daily. Patient not taking: Reported on 08/19/2022 03/21/22   [provider]  doxepin (SINEQUAN) 10 MG capsule Take 1 capsule (10 mg total) by mouth at bedtime. Patient not taking: Reported on 08/19/2022 04/24/22   Orson Eva, NP  ferric citrate  (AURYXIA) 1 GM 210 MG(Fe) tablet Take 210 mg by mouth 3 (three) times daily with meals. Only taking once  a day most days Patient not taking: Reported on 08/19/2022    [provider]  insulin lispro (HUMALOG) 100 UNIT/ML KwikPen Inject into the skin. Patient not taking: Reported on 08/19/2022 06/12/21   [provider]  lactulose (CHRONULAC) 10 GM/15ML solution Take 30 mLs (20 g total) by mouth daily as needed for severe constipation. Patient not taking: Reported on 08/19/2022 04/26/17   Katha Hamming, MD  montelukast (SINGULAIR) 10 MG tablet Take 1 tablet (10 mg total) by mouth at bedtime. Patient not taking: Reported on 08/19/2022 09/17/21   Salena Saner, MD  Omeprazole Magnesium (PRILOSEC PO)     [provider]  OXYGEN Inhale 2-4 L into the lungs.    [provider]  predniSONE (DELTASONE) 50 MG tablet Take 1 tablet by mouth daily in AM with food x 5 days Patient not taking: Reported on 08/19/2022 04/24/22   Orson Eva, NP  RENVELA 2.4 g PACK Take 2.4 g by mouth 3 (three) times daily. Patient not taking: Reported on 08/19/2022 08/07/21   [provider]  sucroferric oxyhydroxide (VELPHORO) 500 MG chewable tablet Chew 500 mg by mouth 3 (three) times daily with meals.    [provider]  Vitamin D, Cholecalciferol, 25 MCG (1000 UT) TABS Take 1,000 Units by mouth daily. Patient not taking: Reported on 08/19/2022    [provider]     Critical care time: 30 minutes     Raechel Chute, MD [facility] 08/31/2022 11:16 AM

## 2022-09-26 DEATH — deceased

## 2022-11-18 ENCOUNTER — Ambulatory Visit: Payer: 59 | Admitting: Cardiovascular Disease

## 2023-03-05 ENCOUNTER — Encounter: Payer: Medicare Other | Admitting: Family
